# Patient Record
Sex: Female | Born: 1945
Health system: Southern US, Community
[De-identification: ages and names within clinical notes are randomized; demographics above are authoritative.]

## PROBLEM LIST (undated history)

## (undated) DIAGNOSIS — M75100 Unspecified rotator cuff tear or rupture of unspecified shoulder, not specified as traumatic: Secondary | ICD-10-CM

## (undated) DIAGNOSIS — A239 Brucellosis, unspecified: Secondary | ICD-10-CM

## (undated) DIAGNOSIS — I871 Compression of vein: Secondary | ICD-10-CM

## (undated) DIAGNOSIS — J90 Pleural effusion, not elsewhere classified: Secondary | ICD-10-CM

## (undated) DIAGNOSIS — I442 Atrioventricular block, complete: Secondary | ICD-10-CM

## (undated) DIAGNOSIS — R0600 Dyspnea, unspecified: Secondary | ICD-10-CM

## (undated) DIAGNOSIS — I639 Cerebral infarction, unspecified: Secondary | ICD-10-CM

## (undated) DIAGNOSIS — I495 Sick sinus syndrome: Secondary | ICD-10-CM

## (undated) DIAGNOSIS — T8859XA Other complications of anesthesia, initial encounter: Secondary | ICD-10-CM

## (undated) DIAGNOSIS — C50919 Malignant neoplasm of unspecified site of unspecified female breast: Secondary | ICD-10-CM

## (undated) DIAGNOSIS — Z923 Personal history of irradiation: Secondary | ICD-10-CM

## (undated) DIAGNOSIS — I4891 Unspecified atrial fibrillation: Secondary | ICD-10-CM

## (undated) DIAGNOSIS — Z8719 Personal history of other diseases of the digestive system: Secondary | ICD-10-CM

## (undated) DIAGNOSIS — E785 Hyperlipidemia, unspecified: Secondary | ICD-10-CM

## (undated) DIAGNOSIS — D219 Benign neoplasm of connective and other soft tissue, unspecified: Secondary | ICD-10-CM

## (undated) DIAGNOSIS — Z87442 Personal history of urinary calculi: Secondary | ICD-10-CM

## (undated) DIAGNOSIS — Z85850 Personal history of malignant neoplasm of thyroid: Secondary | ICD-10-CM

## (undated) DIAGNOSIS — Z8709 Personal history of other diseases of the respiratory system: Secondary | ICD-10-CM

## (undated) DIAGNOSIS — C3430 Malignant neoplasm of lower lobe, unspecified bronchus or lung: Secondary | ICD-10-CM

## (undated) DIAGNOSIS — Z950 Presence of cardiac pacemaker: Secondary | ICD-10-CM

## (undated) DIAGNOSIS — T4145XA Adverse effect of unspecified anesthetic, initial encounter: Secondary | ICD-10-CM

## (undated) DIAGNOSIS — K635 Polyp of colon: Secondary | ICD-10-CM

## (undated) DIAGNOSIS — C73 Malignant neoplasm of thyroid gland: Secondary | ICD-10-CM

## (undated) DIAGNOSIS — D649 Anemia, unspecified: Secondary | ICD-10-CM

## (undated) DIAGNOSIS — I4819 Other persistent atrial fibrillation: Secondary | ICD-10-CM

## (undated) DIAGNOSIS — Z87898 Personal history of other specified conditions: Secondary | ICD-10-CM

## (undated) DIAGNOSIS — I10 Essential (primary) hypertension: Secondary | ICD-10-CM

## (undated) DIAGNOSIS — E039 Hypothyroidism, unspecified: Secondary | ICD-10-CM

## (undated) DIAGNOSIS — N809 Endometriosis, unspecified: Secondary | ICD-10-CM

## (undated) DIAGNOSIS — C8590 Non-Hodgkin lymphoma, unspecified, unspecified site: Secondary | ICD-10-CM

## (undated) DIAGNOSIS — N200 Calculus of kidney: Secondary | ICD-10-CM

## (undated) DIAGNOSIS — Z5189 Encounter for other specified aftercare: Secondary | ICD-10-CM

## (undated) DIAGNOSIS — K759 Inflammatory liver disease, unspecified: Secondary | ICD-10-CM

## (undated) DIAGNOSIS — M1990 Unspecified osteoarthritis, unspecified site: Secondary | ICD-10-CM

## (undated) HISTORY — DX: Benign neoplasm of connective and other soft tissue, unspecified: D21.9

## (undated) HISTORY — DX: Hyperlipidemia, unspecified: E78.5

## (undated) HISTORY — DX: Sick sinus syndrome: I49.5

## (undated) HISTORY — DX: Personal history of malignant neoplasm of thyroid: Z85.850

## (undated) HISTORY — PX: CARDIAC CATHETERIZATION: SHX172

## (undated) HISTORY — DX: Polyp of colon: K63.5

## (undated) HISTORY — DX: Essential (primary) hypertension: I10

## (undated) HISTORY — DX: Malignant neoplasm of thyroid gland: C73

## (undated) HISTORY — PX: LAPAROTOMY: SHX154

## (undated) HISTORY — PX: EYE SURGERY: SHX253

## (undated) HISTORY — DX: Cerebral infarction, unspecified: I63.9

## (undated) HISTORY — PX: KNEE ARTHROSCOPY: SUR90

## (undated) HISTORY — DX: Endometriosis, unspecified: N80.9

## (undated) HISTORY — PX: PORTA CATH INSERTION: CATH118285

## (undated) HISTORY — DX: Morbid (severe) obesity due to excess calories: E66.01

## (undated) HISTORY — PX: OTHER SURGICAL HISTORY: SHX169

## (undated) HISTORY — PX: CHOLECYSTECTOMY: SHX55

## (undated) HISTORY — DX: Malignant neoplasm of unspecified site of unspecified female breast: C50.919

## (undated) HISTORY — PX: TONSILLECTOMY: SUR1361

## (undated) HISTORY — PX: APPENDECTOMY: SHX54

## (undated) HISTORY — DX: Brucellosis, unspecified: A23.9

## (undated) HISTORY — DX: Calculus of kidney: N20.0

## (undated) HISTORY — PX: LAPAROSCOPIC GASTRIC BANDING: SHX1100

## (undated) HISTORY — PX: COLONOSCOPY W/ POLYPECTOMY: SHX1380

## (undated) HISTORY — DX: Unspecified atrial fibrillation: I48.91

---
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
## 1981-09-23 HISTORY — PX: ABDOMINAL HYSTERECTOMY: SHX81

## 1996-09-23 HISTORY — PX: OTHER SURGICAL HISTORY: SHX169

## 1996-09-23 HISTORY — PX: THYROIDECTOMY: SHX17

## 1997-09-23 HISTORY — PX: OTHER SURGICAL HISTORY: SHX169

## 1998-01-25 ENCOUNTER — Other Ambulatory Visit: Admission: RE | Admit: 1998-01-25 | Discharge: 1998-01-25 | Payer: Self-pay | Admitting: Endocrinology

## 1998-03-01 ENCOUNTER — Inpatient Hospital Stay (HOSPITAL_COMMUNITY): Admission: RE | Admit: 1998-03-01 | Discharge: 1998-03-03 | Payer: Self-pay | Admitting: Surgery

## 1998-04-10 ENCOUNTER — Ambulatory Visit (HOSPITAL_COMMUNITY): Admission: RE | Admit: 1998-04-10 | Discharge: 1998-04-10 | Payer: Self-pay | Admitting: *Deleted

## 1998-04-13 ENCOUNTER — Ambulatory Visit (HOSPITAL_COMMUNITY): Admission: RE | Admit: 1998-04-13 | Discharge: 1998-04-13 | Payer: Self-pay | Admitting: *Deleted

## 1998-09-23 DIAGNOSIS — C73 Malignant neoplasm of thyroid gland: Secondary | ICD-10-CM

## 1998-09-23 HISTORY — DX: Malignant neoplasm of thyroid gland: C73

## 1999-04-20 ENCOUNTER — Encounter (INDEPENDENT_AMBULATORY_CARE_PROVIDER_SITE_OTHER): Payer: Self-pay | Admitting: Specialist

## 1999-04-20 ENCOUNTER — Ambulatory Visit (HOSPITAL_COMMUNITY): Admission: RE | Admit: 1999-04-20 | Discharge: 1999-04-20 | Payer: Self-pay | Admitting: Gastroenterology

## 1999-04-25 ENCOUNTER — Inpatient Hospital Stay (HOSPITAL_COMMUNITY): Admission: AD | Admit: 1999-04-25 | Discharge: 1999-04-28 | Payer: Self-pay | Admitting: Cardiology

## 1999-04-25 ENCOUNTER — Encounter: Payer: Self-pay | Admitting: Cardiology

## 1999-05-03 ENCOUNTER — Observation Stay (HOSPITAL_COMMUNITY): Admission: AD | Admit: 1999-05-03 | Discharge: 1999-05-04 | Payer: Self-pay | Admitting: Internal Medicine

## 1999-05-22 ENCOUNTER — Other Ambulatory Visit: Admission: RE | Admit: 1999-05-22 | Discharge: 1999-05-22 | Payer: Self-pay | Admitting: Gynecology

## 1999-09-26 ENCOUNTER — Encounter: Admission: RE | Admit: 1999-09-26 | Discharge: 1999-09-26 | Payer: Self-pay | Admitting: Orthopedic Surgery

## 1999-09-26 ENCOUNTER — Encounter: Payer: Self-pay | Admitting: Orthopedic Surgery

## 1999-09-27 ENCOUNTER — Encounter: Admission: RE | Admit: 1999-09-27 | Discharge: 1999-09-27 | Payer: Self-pay | Admitting: Orthopedic Surgery

## 1999-09-27 ENCOUNTER — Encounter: Payer: Self-pay | Admitting: Orthopedic Surgery

## 1999-12-03 ENCOUNTER — Encounter: Admission: RE | Admit: 1999-12-03 | Discharge: 1999-12-03 | Payer: Self-pay | Admitting: Gynecology

## 1999-12-03 ENCOUNTER — Encounter: Payer: Self-pay | Admitting: Gynecology

## 1999-12-07 ENCOUNTER — Encounter: Admission: RE | Admit: 1999-12-07 | Discharge: 1999-12-07 | Payer: Self-pay | Admitting: Gynecology

## 1999-12-07 ENCOUNTER — Encounter: Payer: Self-pay | Admitting: Gynecology

## 1999-12-21 ENCOUNTER — Other Ambulatory Visit: Admission: RE | Admit: 1999-12-21 | Discharge: 1999-12-21 | Payer: Self-pay | Admitting: Gynecology

## 2003-08-08 ENCOUNTER — Inpatient Hospital Stay (HOSPITAL_COMMUNITY): Admission: EM | Admit: 2003-08-08 | Discharge: 2003-08-10 | Payer: Self-pay | Admitting: Emergency Medicine

## 2003-08-09 ENCOUNTER — Encounter (INDEPENDENT_AMBULATORY_CARE_PROVIDER_SITE_OTHER): Payer: Self-pay | Admitting: Cardiology

## 2005-03-10 IMAGING — MR MR MRA NECK W/ CM
10 of 14 series · 23 of 48 positions shown · IV contrast (omniscan)
Comparison: none

CLINICAL DATA: TIA, headache, gait disturbance.
 MRI OF THE BRAIN WITH AND WITHOUT CONTRAST
 Multiplanar T1- and T2-weighted imaging was performed including diffusion imaging and imaging after the intravenous administration of 20 cc of Omniscan.  
 Diffusion imaging does not show any acute or subacute stroke.  There is an old small vessel stroke in the right side of the pons.  The cerebellum does not show any focal insult.  Within the cerebral hemispheres, there are scattered foci of abnormal high signal on flair and T2-weighted imaging within the deep and subcortical white matter consistent with chronic small vessel disease.  There are some old lacunar insults in the basal ganglia and thalami.  No evidence of cortical or large vessel stroke.  No sign of mass, hemorrhage, hydrocephalus, or extra-axial collection.  After contrast administration, there is no abnormal enhancement.
 IMPRESSION
 1.  No acute finding.  Moderate changes of chronic small vessel disease throughout the brain as described.
 INTRACRANIAL MR ANGIOGRAPHY
 Time of flight MR angiography was performed of the proximal intracranial vasculature.  2 and 3-D reconstructions were done.  
 Both internal carotid arteries are widely patent into the brain.  No siphon stenosis. The anterior and middle cerebral vessels are patent bilaterally without stenosis, aneurysm, or vascular malformation. Both vertebral arteries are patent to form a normal caliber basilar artery.  The cerebellar circulation is intact.  Both posterior cerebral arteries appear patent and normal. 
 1.  Normal intracranial MR angiography of the large and medium sized vessels.
 MR ANGIOGRAPHY OF THE NECK VESSELS
 Time of flight MR angiography is performed of the neck vessels during the bolus administration of 20 cc of Omniscan.  2 and 3-D reconstructions were done.
 The branching pattern of the brachiocephalic vessels from the arch is normal.  No origin stenoses.  Both common carotid arteries are widely patent to the bifurcations.  There is no atherosclerotic narrowing or irregularity at either carotid bifurcation.  Both vertebral arteries are patent.  The left vertebral artery is quite tortuous proximally and it is difficult to evaluate this accurately.  There is some signal loss in the region of the left vertebral artery origin but this is more likely artifactual than related to a stenosis in this patient who doesn?t show any stenosis anywhere else.
 1.  Normal MR angiography of the neck vessels.  Artifactual signal loss at the left vertebral artery origin which I do not think relates to true pathology.

[Series 2: T1 · sagittal · 5.0mm · 0.43mm/px · 1 of 12 slices shown]
[im 1/12]
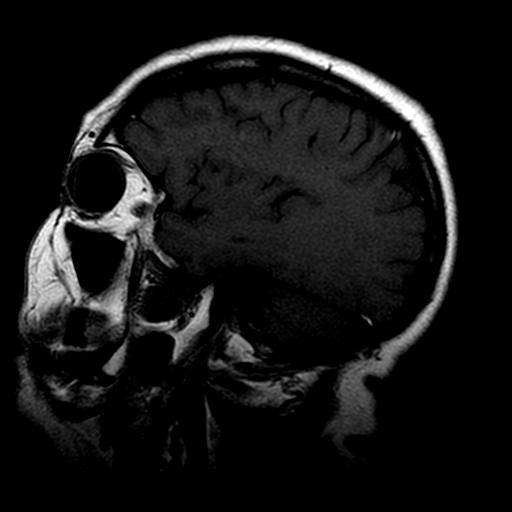

[Series 3: DWI · axial · 5.0mm · 1.25mm/px · z∈[+14,+124]mm · 2 of 48 slices shown]
[im 1/48]
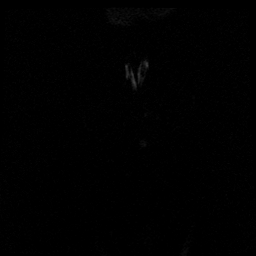
[im 48/48]
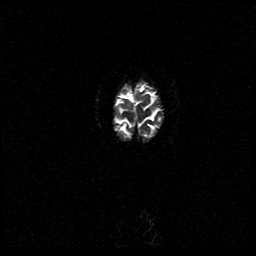

[Series 5: T2 · axial · 5.0mm · 0.43mm/px · 1 of 20 slices shown (1 of 2)]
[im 1/20]
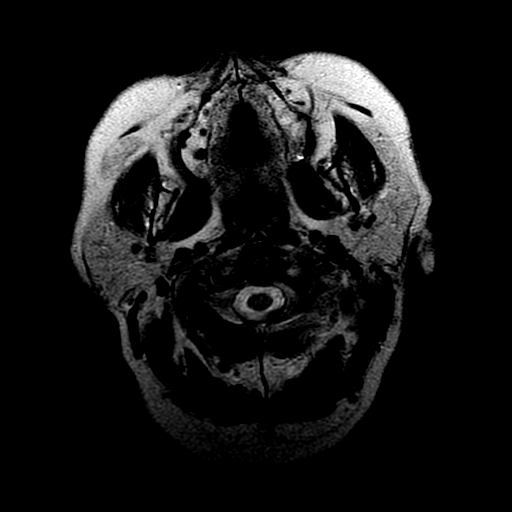

[Series 6: FLAIR · axial · 5.0mm · 0.43mm/px · 1 of 20 slices shown]
[im 1/20]
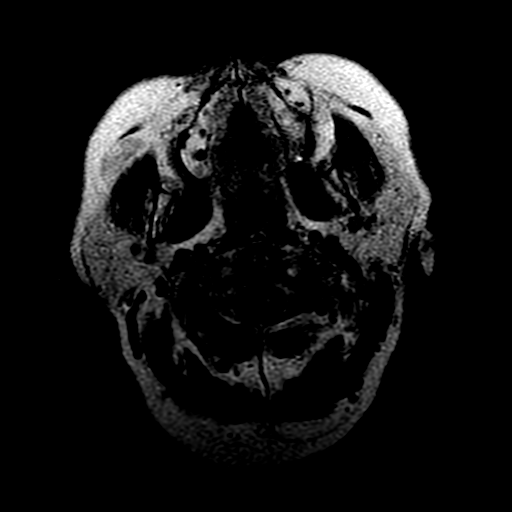

[Series 8: T2 · coronal · 5.0mm · 0.43mm/px · 1 of 20 slices shown (2 of 2)]
[im 1/20]
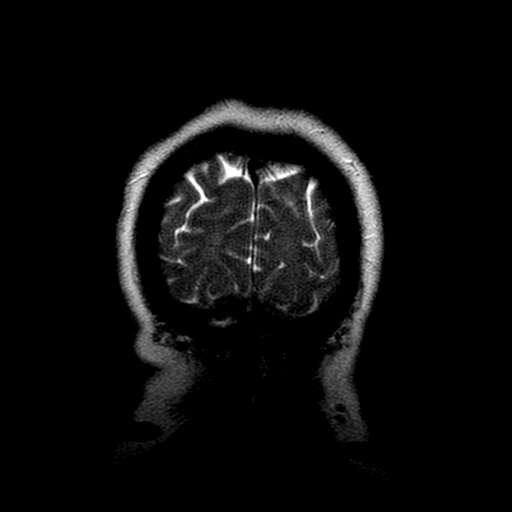

[Series 9: loc cor · axial · 5.0mm · 0.68mm/px · 1 of 21 slices shown]
[im 1/21]
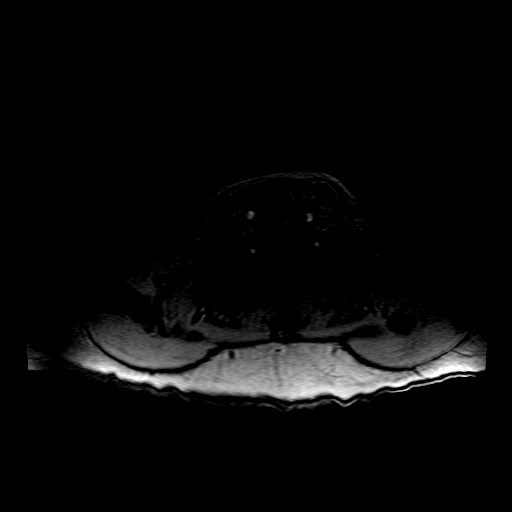

[Series 10: TOF · axial · 4.0mm · 1.09mm/px · z∈[-255,+8]mm · 5 of 102 slices shown]
[im 1/102]
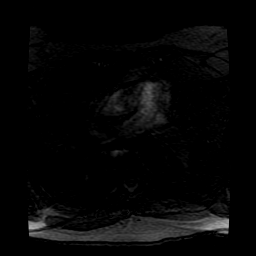
[im 26/102]
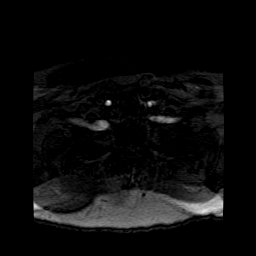
[im 51/102]
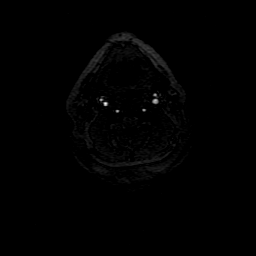
[im 76/102]
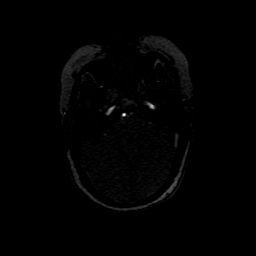
[im 102/102]
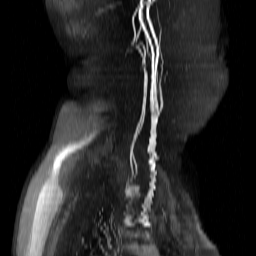

[Series 11: mask · coronal · 1.4mm · 0.55mm/px · 6 of 113 slices shown]
[im 1/113]
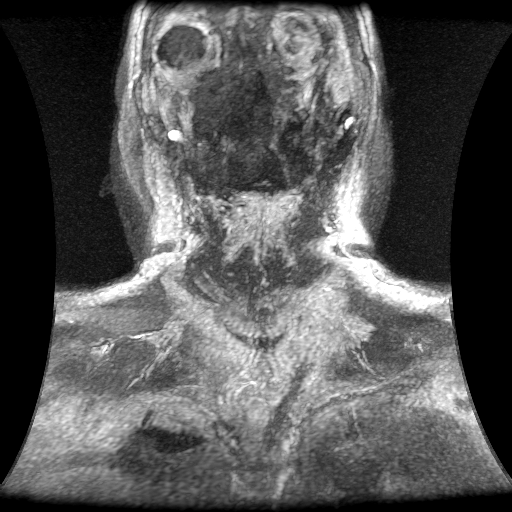
[im 23/113]
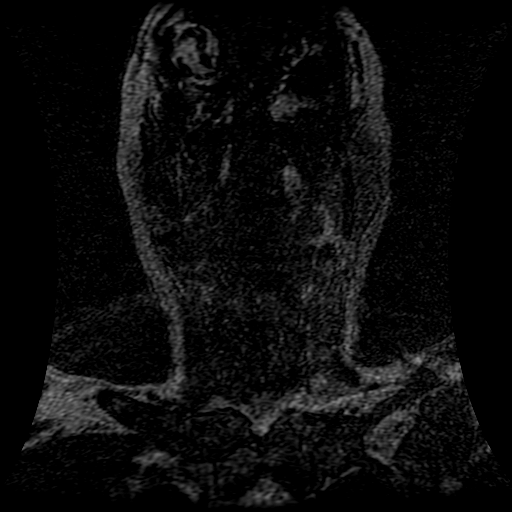
[im 45/113]
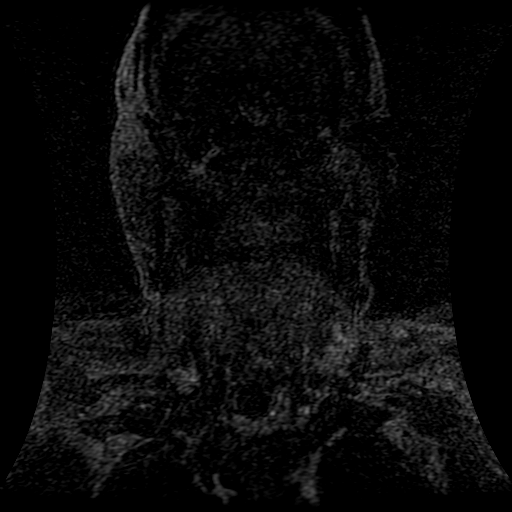
[im 68/113]
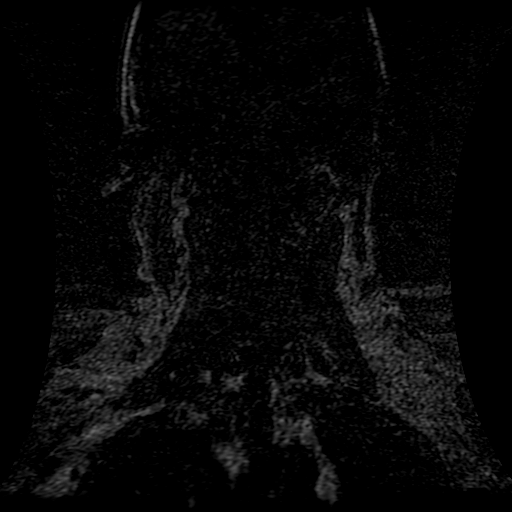
[im 90/113]
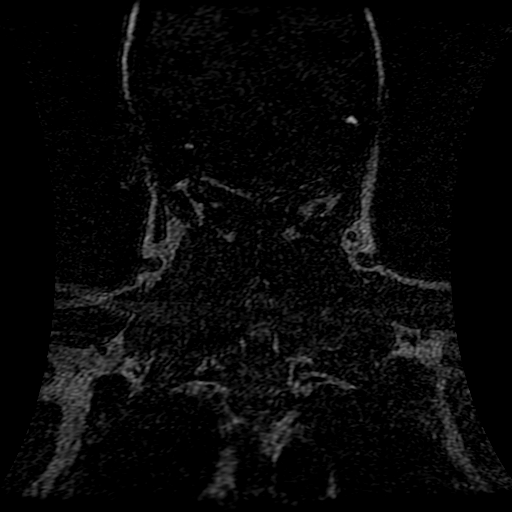
[im 113/113]
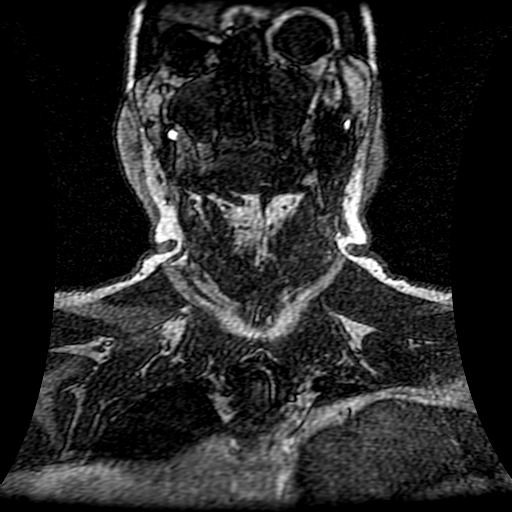

[Series 12: fl tr cor · coronal · 1.4mm · 0.55mm/px · 4 of 224 slices shown]
[im 1/224]
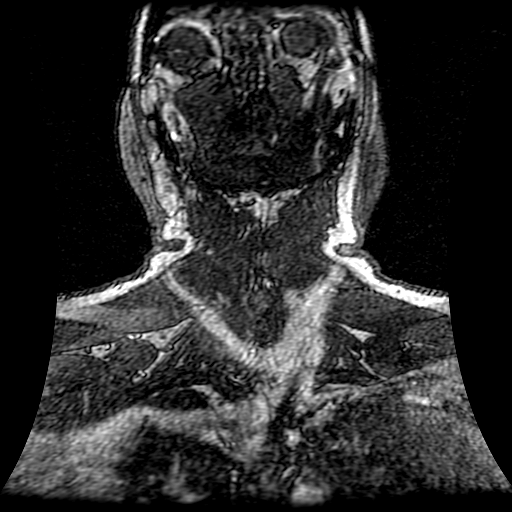
[im 41/224]
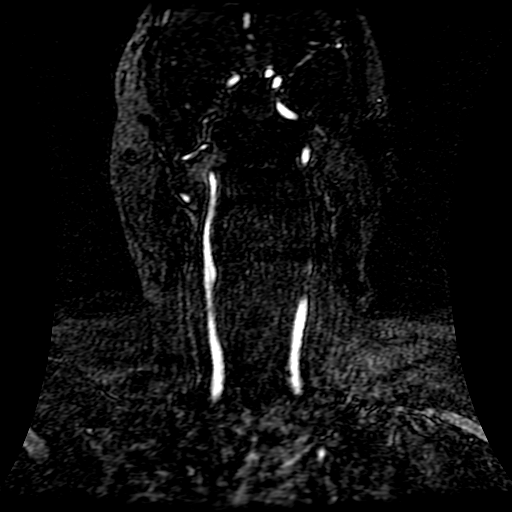
[im 61/224]
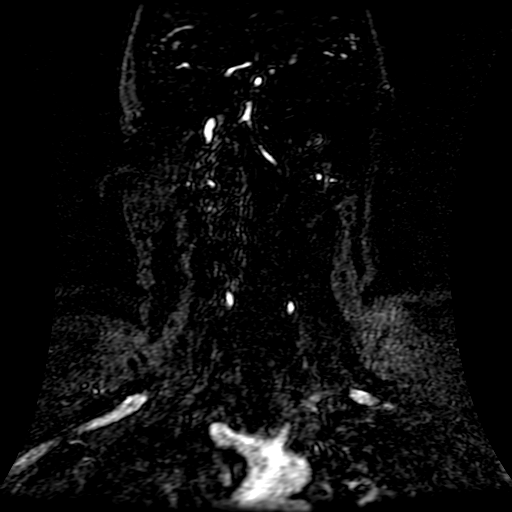
[im 102/224]
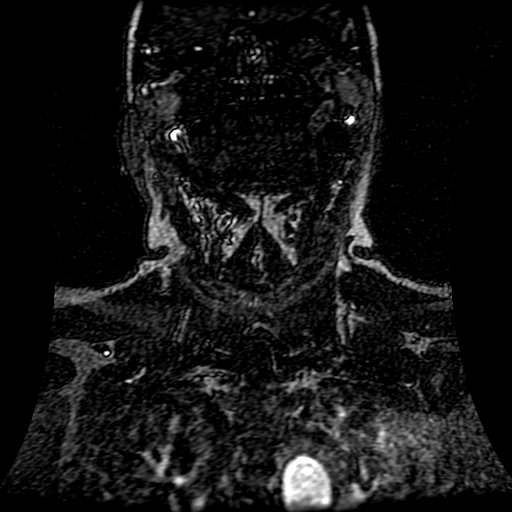

[Series 14: T1 post-contrast · coronal · 5.0mm · 0.43mm/px · 1 of 22 slices shown]
[im 1/22]
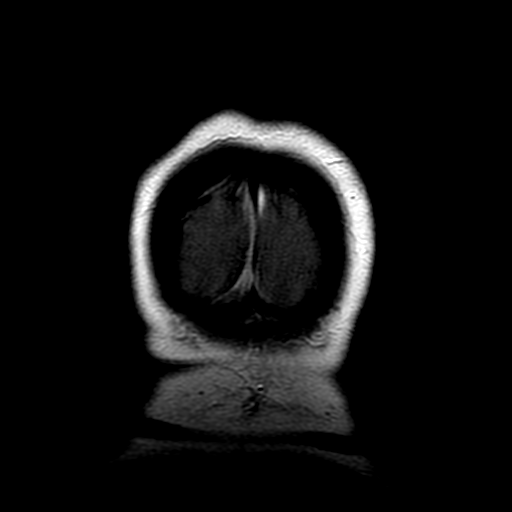

[23 of 48 positions shown; findings below may reference images not displayed]

## 2005-03-10 IMAGING — CT CT HEAD W/O CM
1 series · 16 of 30 positions shown, 20 images · non-contrast
Comparison: none

CLINICAL DATA: Dizziness, disorientation/atrial fibrillation. 
 CT OF THE BRAIN, WITHOUT CONTRAST 
 Routine unenhanced CT of the brain with no priors for comparison. 
 Ventricular size and CSF spaces within normal limits.  On image #17, there is a small lacunar infarct in the left caudate nucleus with a suggestion of a rim like area in the periphery surrounding it.  This is consistent with an infarct and could be acute or subacute. It is not a typical old lacunar infarct.  
 There is also bilateral basal ganglia calcification.  
 No mass effect or bleed. 
 IMPRESSION 
 There is an infarct in the left caudate nucleus, age indeterminant.  
 Otherwise, no acute or focal abnormality.

[Series 2: brain · axial · 0.47mm/px · z∈[+143,+284]mm · 16 of 30 slices shown, 20 images]
[im 2/30  brain]
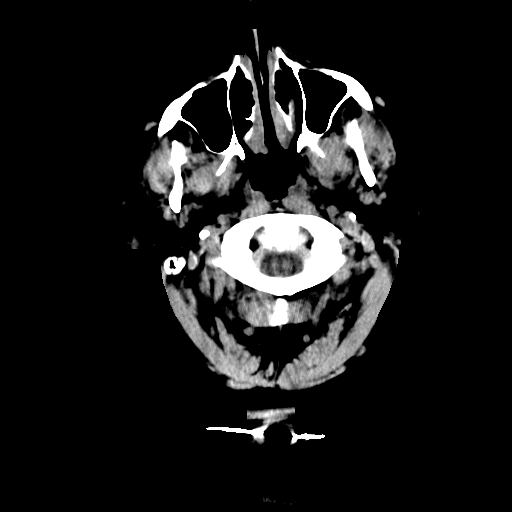
[im 2/30  bone]
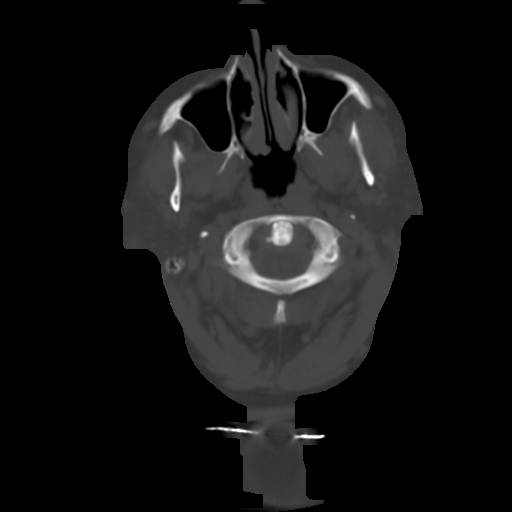
[im 4/30  brain]
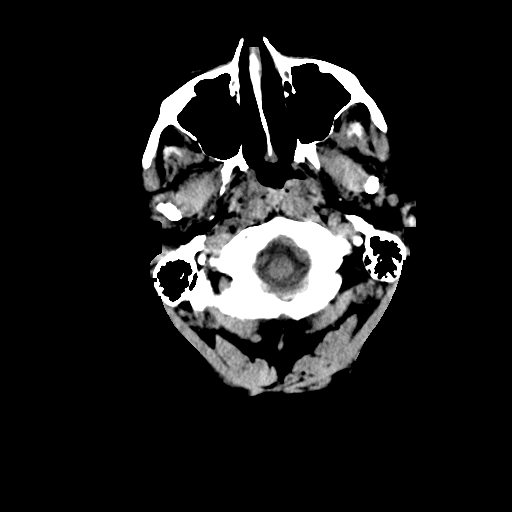
[im 6/30  brain]
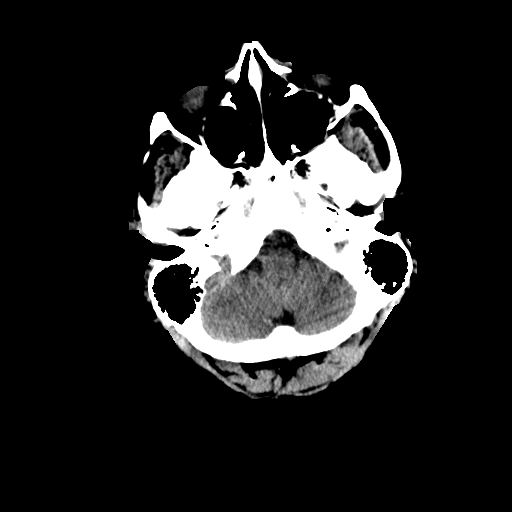
[im 8/30  brain]
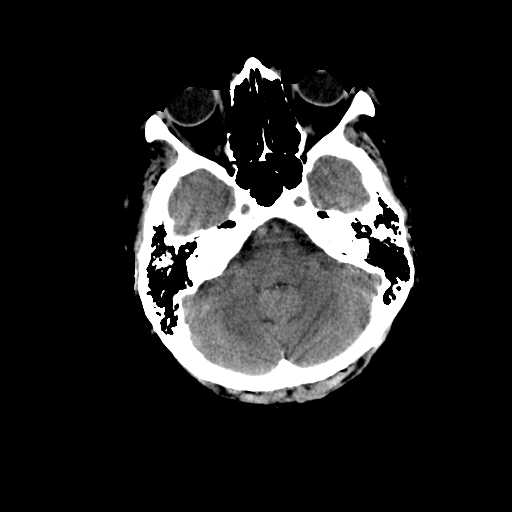
[im 9/30  brain]
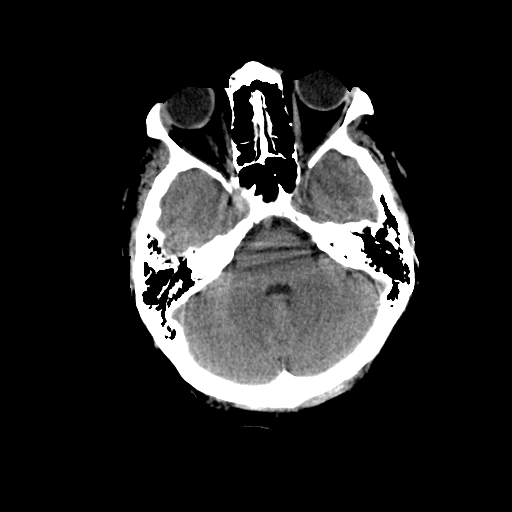
[im 9/30  bone]
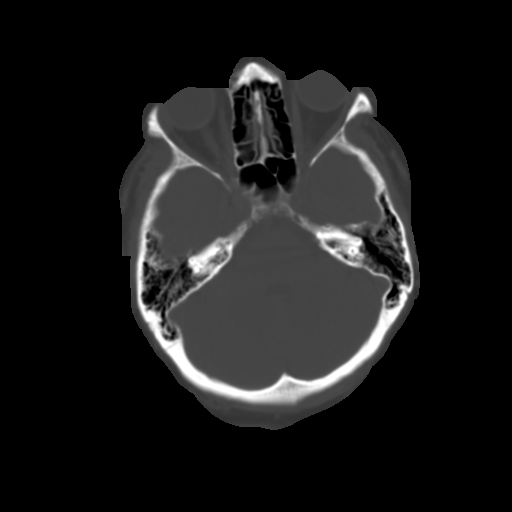
[im 11/30  brain]
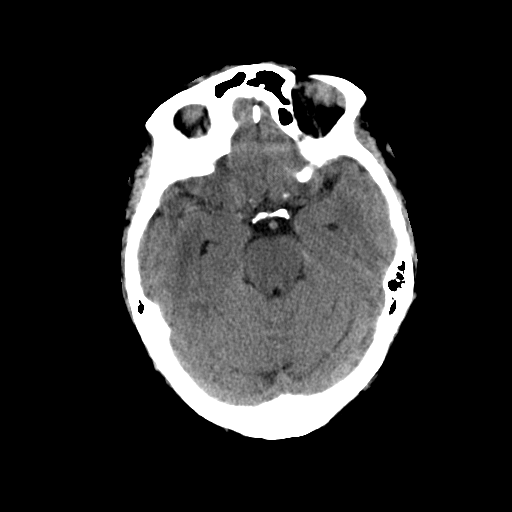
[im 13/30  brain]
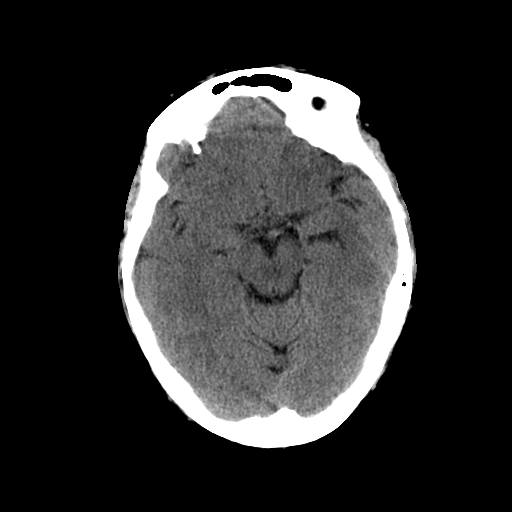
[im 15/30  brain]
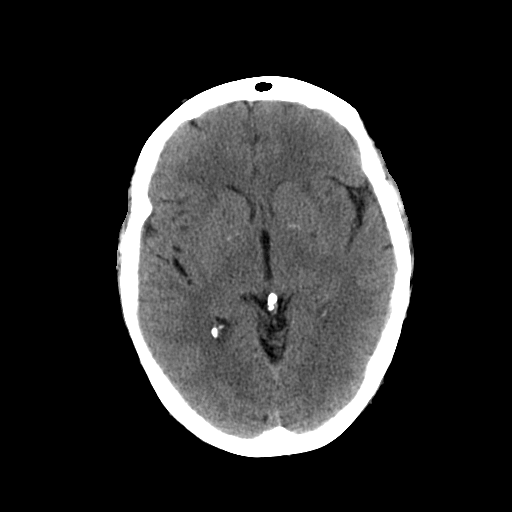
[im 16/30  brain]
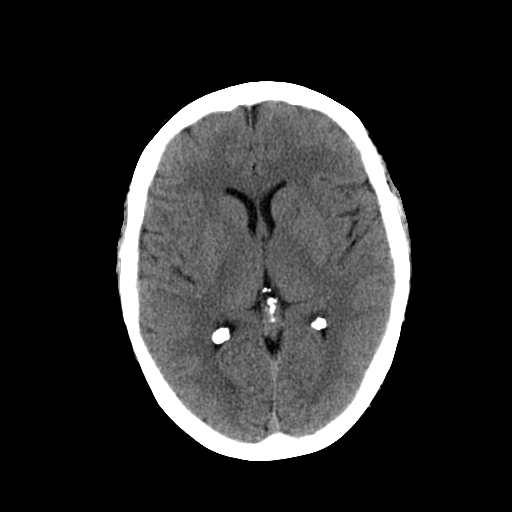
[im 16/30  bone]
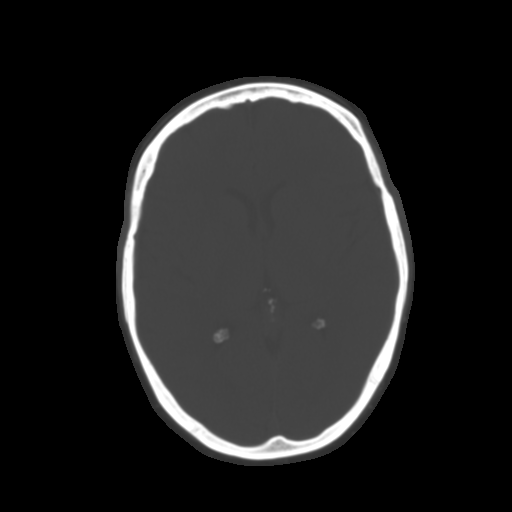
[im 18/30  brain]
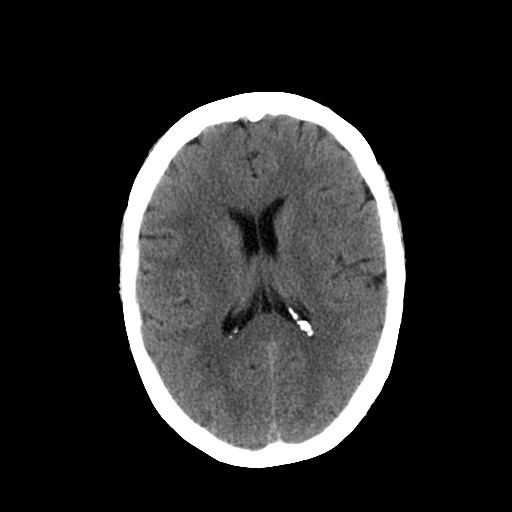
[im 20/30  brain]
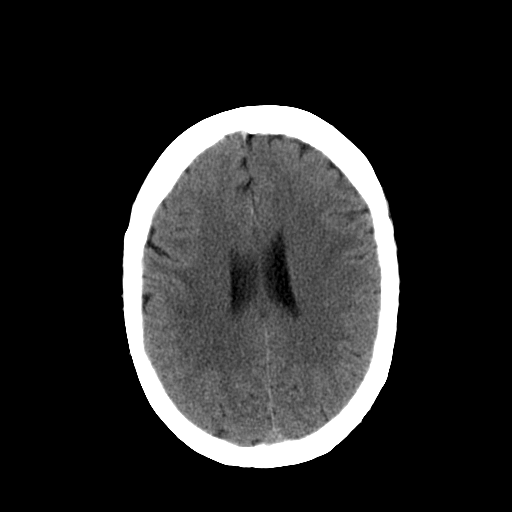
[im 22/30  brain]
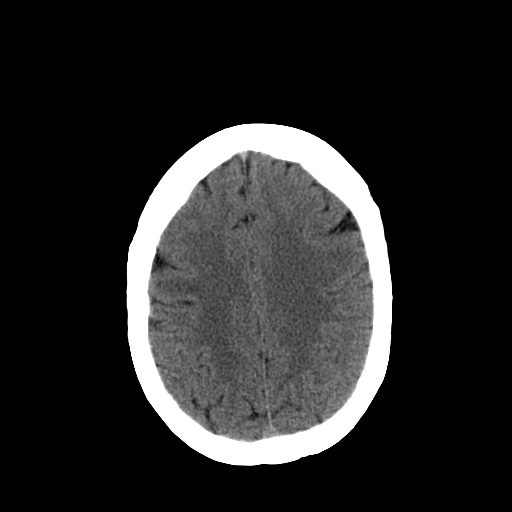
[im 23/30  brain]
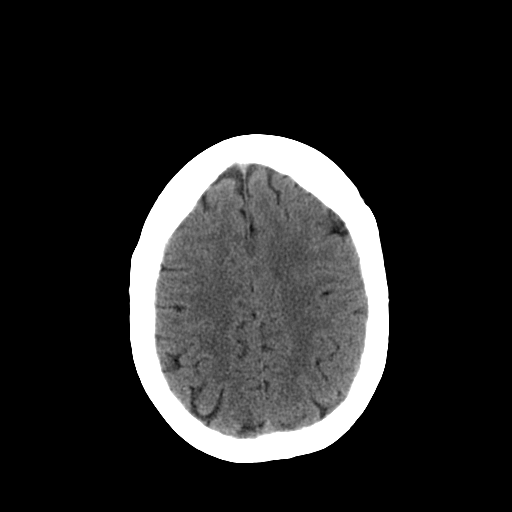
[im 23/30  bone]
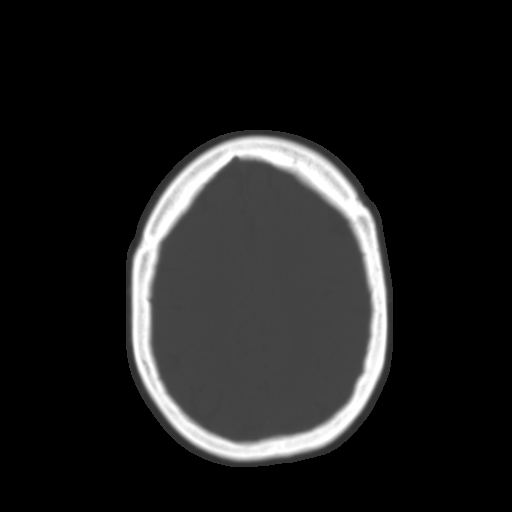
[im 25/30  brain]
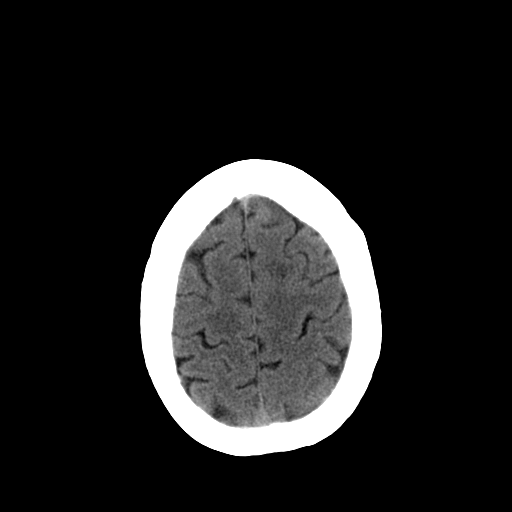
[im 27/30  brain]
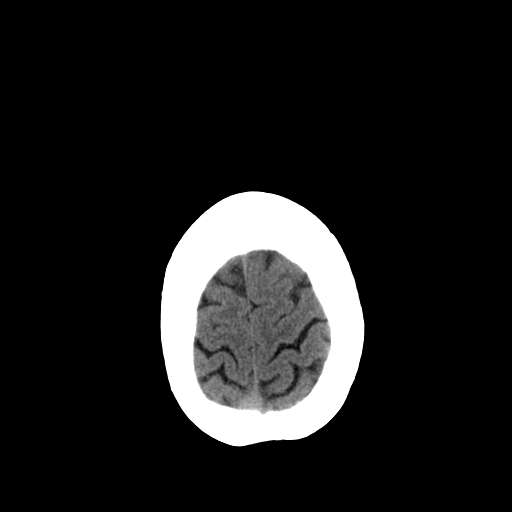
[im 29/30  brain]
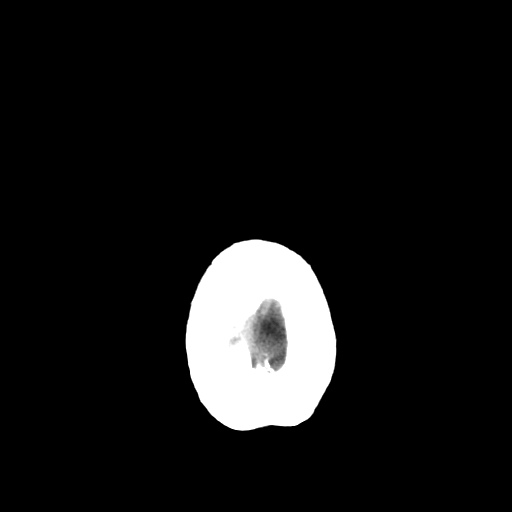

[16 of 30 positions shown; findings below may reference images not displayed]

## 2006-08-01 ENCOUNTER — Emergency Department (HOSPITAL_COMMUNITY): Admission: EM | Admit: 2006-08-01 | Discharge: 2006-08-01 | Payer: Self-pay | Admitting: Emergency Medicine

## 2007-03-04 ENCOUNTER — Ambulatory Visit: Payer: Self-pay

## 2007-04-08 ENCOUNTER — Ambulatory Visit: Payer: Self-pay | Admitting: Internal Medicine

## 2007-04-08 LAB — CONVERTED CEMR LAB
Albumin: 3.7 g/dL (ref 3.5–5.2)
Alkaline Phosphatase: 84 units/L (ref 39–117)
Total Protein: 7.3 g/dL (ref 6.0–8.3)

## 2007-05-21 ENCOUNTER — Ambulatory Visit: Payer: Self-pay | Admitting: Cardiology

## 2007-06-04 ENCOUNTER — Ambulatory Visit: Payer: Self-pay | Admitting: Internal Medicine

## 2007-06-22 ENCOUNTER — Ambulatory Visit: Payer: Self-pay | Admitting: Internal Medicine

## 2007-06-22 DIAGNOSIS — E782 Mixed hyperlipidemia: Secondary | ICD-10-CM | POA: Insufficient documentation

## 2007-06-22 DIAGNOSIS — I4891 Unspecified atrial fibrillation: Secondary | ICD-10-CM | POA: Insufficient documentation

## 2007-06-22 DIAGNOSIS — Z85850 Personal history of malignant neoplasm of thyroid: Secondary | ICD-10-CM | POA: Insufficient documentation

## 2007-06-23 ENCOUNTER — Ambulatory Visit: Payer: Self-pay | Admitting: Cardiology

## 2007-06-29 ENCOUNTER — Encounter: Payer: Self-pay | Admitting: Internal Medicine

## 2007-07-14 ENCOUNTER — Encounter: Payer: Self-pay | Admitting: Internal Medicine

## 2007-07-15 ENCOUNTER — Ambulatory Visit: Payer: Self-pay | Admitting: Cardiology

## 2007-07-15 ENCOUNTER — Ambulatory Visit: Payer: Self-pay | Admitting: Internal Medicine

## 2007-08-07 ENCOUNTER — Encounter: Payer: Self-pay | Admitting: Internal Medicine

## 2007-08-07 ENCOUNTER — Ambulatory Visit: Payer: Self-pay

## 2007-08-07 ENCOUNTER — Ambulatory Visit: Payer: Self-pay | Admitting: Cardiology

## 2007-08-10 ENCOUNTER — Ambulatory Visit (HOSPITAL_BASED_OUTPATIENT_CLINIC_OR_DEPARTMENT_OTHER): Admission: RE | Admit: 2007-08-10 | Discharge: 2007-08-10 | Payer: Self-pay | Admitting: Internal Medicine

## 2007-08-10 ENCOUNTER — Encounter: Payer: Self-pay | Admitting: Pulmonary Disease

## 2007-08-12 ENCOUNTER — Telehealth (INDEPENDENT_AMBULATORY_CARE_PROVIDER_SITE_OTHER): Payer: Self-pay | Admitting: *Deleted

## 2007-08-13 ENCOUNTER — Ambulatory Visit: Payer: Self-pay | Admitting: Cardiology

## 2007-08-18 ENCOUNTER — Ambulatory Visit: Payer: Self-pay | Admitting: Pulmonary Disease

## 2007-08-19 ENCOUNTER — Ambulatory Visit: Payer: Self-pay | Admitting: Cardiology

## 2007-08-28 ENCOUNTER — Ambulatory Visit: Payer: Self-pay | Admitting: Cardiology

## 2007-08-31 ENCOUNTER — Encounter: Payer: Self-pay | Admitting: Internal Medicine

## 2007-09-04 ENCOUNTER — Ambulatory Visit: Payer: Self-pay | Admitting: Internal Medicine

## 2007-09-10 ENCOUNTER — Encounter: Payer: Self-pay | Admitting: Internal Medicine

## 2007-09-13 ENCOUNTER — Ambulatory Visit: Payer: Self-pay | Admitting: Cardiology

## 2007-09-13 ENCOUNTER — Emergency Department (HOSPITAL_COMMUNITY): Admission: EM | Admit: 2007-09-13 | Discharge: 2007-09-13 | Payer: Self-pay | Admitting: Emergency Medicine

## 2007-09-22 ENCOUNTER — Ambulatory Visit: Payer: Self-pay | Admitting: Cardiology

## 2007-09-22 ENCOUNTER — Ambulatory Visit: Payer: Self-pay | Admitting: Pulmonary Disease

## 2007-09-28 ENCOUNTER — Ambulatory Visit: Payer: Self-pay | Admitting: Internal Medicine

## 2007-09-28 ENCOUNTER — Ambulatory Visit: Payer: Self-pay | Admitting: Cardiology

## 2007-10-16 ENCOUNTER — Ambulatory Visit: Payer: Self-pay | Admitting: Cardiology

## 2007-10-30 ENCOUNTER — Telehealth (INDEPENDENT_AMBULATORY_CARE_PROVIDER_SITE_OTHER): Payer: Self-pay | Admitting: *Deleted

## 2007-11-03 ENCOUNTER — Ambulatory Visit: Payer: Self-pay | Admitting: Internal Medicine

## 2007-11-17 ENCOUNTER — Encounter: Payer: Self-pay | Admitting: Internal Medicine

## 2007-11-26 ENCOUNTER — Ambulatory Visit: Payer: Self-pay | Admitting: Cardiovascular Disease

## 2007-12-07 ENCOUNTER — Encounter: Payer: Self-pay | Admitting: Internal Medicine

## 2007-12-24 ENCOUNTER — Ambulatory Visit: Payer: Self-pay | Admitting: Cardiology

## 2008-01-14 ENCOUNTER — Ambulatory Visit: Payer: Self-pay | Admitting: Cardiology

## 2008-02-11 ENCOUNTER — Ambulatory Visit: Payer: Self-pay | Admitting: Internal Medicine
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
## 2008-03-10 ENCOUNTER — Ambulatory Visit: Payer: Self-pay | Admitting: Cardiology

## 2008-04-07 ENCOUNTER — Ambulatory Visit: Payer: Self-pay | Admitting: Cardiology

## 2008-04-22 ENCOUNTER — Ambulatory Visit: Payer: Self-pay | Admitting: Internal Medicine

## 2008-04-22 LAB — CONVERTED CEMR LAB
Bilirubin Urine: NEGATIVE
Ketones, urine, test strip: NEGATIVE
Protein, U semiquant: NEGATIVE
Urobilinogen, UA: 0.2
pH: 5

## 2008-04-23 ENCOUNTER — Encounter: Payer: Self-pay | Admitting: Internal Medicine

## 2008-04-24 LAB — CONVERTED CEMR LAB

## 2008-04-29 ENCOUNTER — Encounter (INDEPENDENT_AMBULATORY_CARE_PROVIDER_SITE_OTHER): Payer: Self-pay | Admitting: *Deleted

## 2008-04-29 ENCOUNTER — Ambulatory Visit: Payer: Self-pay | Admitting: Internal Medicine

## 2008-04-29 DIAGNOSIS — R319 Hematuria, unspecified: Secondary | ICD-10-CM | POA: Insufficient documentation

## 2008-04-29 DIAGNOSIS — R7309 Other abnormal glucose: Secondary | ICD-10-CM | POA: Insufficient documentation

## 2008-04-29 DIAGNOSIS — R21 Rash and other nonspecific skin eruption: Secondary | ICD-10-CM | POA: Insufficient documentation

## 2008-04-29 DIAGNOSIS — D126 Benign neoplasm of colon, unspecified: Secondary | ICD-10-CM | POA: Insufficient documentation

## 2008-04-29 LAB — CONVERTED CEMR LAB
Glucose, Urine, Semiquant: NEGATIVE
Ketones, urine, test strip: NEGATIVE
Nitrite: NEGATIVE
Specific Gravity, Urine: 1.02

## 2008-04-30 ENCOUNTER — Encounter: Payer: Self-pay | Admitting: Internal Medicine

## 2008-05-02 ENCOUNTER — Telehealth (INDEPENDENT_AMBULATORY_CARE_PROVIDER_SITE_OTHER): Payer: Self-pay | Admitting: *Deleted

## 2008-05-02 LAB — CONVERTED CEMR LAB
AST: 19 units/L (ref 0–37)
Albumin: 4 g/dL (ref 3.5–5.2)
Alkaline Phosphatase: 101 units/L (ref 39–117)
Creatinine, Ser: 0.9 mg/dL (ref 0.4–1.2)
TSH: 0.04 microintl units/mL — ABNORMAL LOW (ref 0.35–5.50)
Total Bilirubin: 1.1 mg/dL (ref 0.3–1.2)
Total CHOL/HDL Ratio: 5
Triglycerides: 151 mg/dL — ABNORMAL HIGH (ref 0–149)

## 2008-05-03 ENCOUNTER — Encounter: Payer: Self-pay | Admitting: Internal Medicine

## 2008-05-03 LAB — CONVERTED CEMR LAB
Nitrite: NEGATIVE
Specific Gravity, Urine: 1.023 (ref 1.005–1.03)
Urobilinogen, UA: 0.2 (ref 0.0–1.0)

## 2008-05-05 ENCOUNTER — Ambulatory Visit: Payer: Self-pay | Admitting: Cardiology

## 2008-05-12 ENCOUNTER — Encounter: Admission: RE | Admit: 2008-05-12 | Discharge: 2008-05-12 | Payer: Self-pay | Admitting: Internal Medicine

## 2008-05-17 ENCOUNTER — Telehealth (INDEPENDENT_AMBULATORY_CARE_PROVIDER_SITE_OTHER): Payer: Self-pay | Admitting: *Deleted

## 2008-05-26 ENCOUNTER — Ambulatory Visit: Payer: Self-pay | Admitting: Internal Medicine

## 2008-06-02 ENCOUNTER — Ambulatory Visit: Payer: Self-pay | Admitting: Cardiology

## 2008-06-15 ENCOUNTER — Ambulatory Visit: Payer: Self-pay | Admitting: Cardiology

## 2008-06-15 ENCOUNTER — Inpatient Hospital Stay (HOSPITAL_COMMUNITY): Admission: AD | Admit: 2008-06-15 | Discharge: 2008-06-17 | Payer: Self-pay | Admitting: Internal Medicine

## 2008-06-21 ENCOUNTER — Ambulatory Visit: Payer: Self-pay | Admitting: Internal Medicine

## 2008-07-07 ENCOUNTER — Ambulatory Visit: Payer: Self-pay | Admitting: Internal Medicine

## 2008-08-04 ENCOUNTER — Ambulatory Visit: Payer: Self-pay | Admitting: Cardiology

## 2008-08-16 ENCOUNTER — Ambulatory Visit: Payer: Self-pay | Admitting: Cardiology

## 2008-08-24 ENCOUNTER — Telehealth (INDEPENDENT_AMBULATORY_CARE_PROVIDER_SITE_OTHER): Payer: Self-pay | Admitting: *Deleted

## 2008-08-25 ENCOUNTER — Ambulatory Visit: Payer: Self-pay | Admitting: Internal Medicine

## 2008-09-03 LAB — CONVERTED CEMR LAB
Bilirubin, Direct: 0.1 mg/dL (ref 0.0–0.3)
LDL Cholesterol: 88 mg/dL (ref 0–99)
Total Bilirubin: 0.7 mg/dL (ref 0.3–1.2)
Total Protein: 7.1 g/dL (ref 6.0–8.3)
VLDL: 40 mg/dL (ref 0–40)

## 2008-09-06 ENCOUNTER — Ambulatory Visit: Payer: Self-pay | Admitting: Cardiology

## 2008-09-06 ENCOUNTER — Encounter (INDEPENDENT_AMBULATORY_CARE_PROVIDER_SITE_OTHER): Payer: Self-pay | Admitting: *Deleted

## 2008-09-06 ENCOUNTER — Telehealth (INDEPENDENT_AMBULATORY_CARE_PROVIDER_SITE_OTHER): Payer: Self-pay | Admitting: *Deleted

## 2008-09-06 LAB — CONVERTED CEMR LAB
BUN: 23 mg/dL (ref 6–23)
Chloride: 108 meq/L (ref 96–112)
GFR calc non Af Amer: 59 mL/min
Glucose, Bld: 100 mg/dL — ABNORMAL HIGH (ref 70–99)
Potassium: 4.5 meq/L (ref 3.5–5.1)

## 2008-09-08 ENCOUNTER — Ambulatory Visit: Payer: Self-pay | Admitting: Internal Medicine

## 2008-09-09 ENCOUNTER — Encounter: Payer: Self-pay | Admitting: Cardiology

## 2008-09-09 ENCOUNTER — Ambulatory Visit: Payer: Self-pay | Admitting: Cardiology

## 2008-09-09 ENCOUNTER — Ambulatory Visit (HOSPITAL_COMMUNITY): Admission: RE | Admit: 2008-09-09 | Discharge: 2008-09-09 | Payer: Self-pay | Admitting: Cardiology

## 2008-09-23 HISTORY — PX: BREAST LUMPECTOMY: SHX2

## 2008-09-26 ENCOUNTER — Ambulatory Visit: Payer: Self-pay | Admitting: Internal Medicine

## 2008-09-28 ENCOUNTER — Ambulatory Visit: Payer: Self-pay | Admitting: Pulmonary Disease

## 2008-09-28 ENCOUNTER — Inpatient Hospital Stay (HOSPITAL_COMMUNITY): Admission: EM | Admit: 2008-09-28 | Discharge: 2008-10-04 | Payer: Self-pay | Admitting: Emergency Medicine

## 2008-09-28 ENCOUNTER — Ambulatory Visit: Payer: Self-pay | Admitting: Internal Medicine

## 2008-09-28 ENCOUNTER — Ambulatory Visit: Payer: Self-pay | Admitting: Family Medicine

## 2008-10-02 ENCOUNTER — Encounter: Payer: Self-pay | Admitting: Pulmonary Disease

## 2008-10-06 ENCOUNTER — Ambulatory Visit: Payer: Self-pay | Admitting: Internal Medicine

## 2008-10-11 ENCOUNTER — Ambulatory Visit: Payer: Self-pay | Admitting: Pulmonary Disease

## 2008-10-13 ENCOUNTER — Ambulatory Visit: Payer: Self-pay | Admitting: Internal Medicine

## 2008-10-13 LAB — CONVERTED CEMR LAB
Basophils Absolute: 0 10*3/uL (ref 0.0–0.1)
Bilirubin, Direct: 0.1 mg/dL (ref 0.0–0.3)
Calcium: 9 mg/dL (ref 8.4–10.5)
Creatinine, Ser: 1.2 mg/dL (ref 0.4–1.2)
Eosinophils Absolute: 0.1 10*3/uL (ref 0.0–0.7)
Eosinophils Relative: 0.4 % (ref 0.0–5.0)
GFR calc non Af Amer: 48 mL/min
HCT: 40.5 % (ref 36.0–46.0)
INR: 6.9 (ref 0.8–1.0)
Lymphocytes Relative: 7.9 % — ABNORMAL LOW (ref 12.0–46.0)
Monocytes Relative: 4.9 % (ref 3.0–12.0)
Platelets: 291 10*3/uL (ref 150–400)
Sodium: 139 meq/L (ref 135–145)
Total Bilirubin: 0.9 mg/dL (ref 0.3–1.2)
WBC: 15.1 10*3/uL — ABNORMAL HIGH (ref 4.5–10.5)

## 2008-10-17 ENCOUNTER — Ambulatory Visit: Payer: Self-pay | Admitting: Internal Medicine

## 2008-10-17 LAB — CONVERTED CEMR LAB
Basophils Relative: 0.1 % (ref 0.0–3.0)
Eosinophils Relative: 1 % (ref 0.0–5.0)
Hemoglobin: 14 g/dL (ref 12.0–15.0)
INR: 2.1 — ABNORMAL HIGH (ref 0.8–1.0)
Lymphocytes Relative: 20.6 % (ref 12.0–46.0)
Monocytes Relative: 10 % (ref 3.0–12.0)
Neutro Abs: 8.4 10*3/uL — ABNORMAL HIGH (ref 1.4–7.7)
Prothrombin Time: 21.7 s — ABNORMAL HIGH (ref 10.9–13.3)
RBC: 4.69 M/uL (ref 3.87–5.11)
WBC: 12.2 10*3/uL — ABNORMAL HIGH (ref 4.5–10.5)

## 2008-10-21 ENCOUNTER — Ambulatory Visit: Payer: Self-pay | Admitting: Internal Medicine

## 2008-10-27 ENCOUNTER — Ambulatory Visit: Payer: Self-pay | Admitting: Internal Medicine

## 2008-11-01 ENCOUNTER — Ambulatory Visit: Payer: Self-pay | Admitting: Internal Medicine

## 2008-11-10 ENCOUNTER — Ambulatory Visit: Payer: Self-pay | Admitting: Cardiovascular Disease

## 2008-12-01 ENCOUNTER — Ambulatory Visit: Payer: Self-pay | Admitting: Cardiovascular Disease

## 2008-12-22 ENCOUNTER — Telehealth (INDEPENDENT_AMBULATORY_CARE_PROVIDER_SITE_OTHER): Payer: Self-pay | Admitting: *Deleted

## 2008-12-27 ENCOUNTER — Ambulatory Visit: Payer: Self-pay | Admitting: Pulmonary Disease

## 2008-12-27 ENCOUNTER — Telehealth: Payer: Self-pay | Admitting: Internal Medicine

## 2008-12-29 ENCOUNTER — Ambulatory Visit: Payer: Self-pay | Admitting: Cardiology

## 2009-01-13 ENCOUNTER — Ambulatory Visit: Payer: Self-pay | Admitting: Internal Medicine

## 2009-01-13 ENCOUNTER — Encounter: Payer: Self-pay | Admitting: Internal Medicine

## 2009-01-16 ENCOUNTER — Telehealth (INDEPENDENT_AMBULATORY_CARE_PROVIDER_SITE_OTHER): Payer: Self-pay | Admitting: *Deleted

## 2009-01-16 LAB — CONVERTED CEMR LAB
ALT: 24 units/L (ref 0–35)
AST: 22 units/L (ref 0–37)
TSH: 0.82 microintl units/mL (ref 0.35–5.50)
Total Bilirubin: 0.8 mg/dL (ref 0.3–1.2)
Total Protein: 6.9 g/dL (ref 6.0–8.3)

## 2009-01-25 ENCOUNTER — Telehealth (INDEPENDENT_AMBULATORY_CARE_PROVIDER_SITE_OTHER): Payer: Self-pay | Admitting: *Deleted

## 2009-01-30 ENCOUNTER — Telehealth (INDEPENDENT_AMBULATORY_CARE_PROVIDER_SITE_OTHER): Payer: Self-pay | Admitting: *Deleted

## 2009-02-03 ENCOUNTER — Ambulatory Visit: Payer: Self-pay | Admitting: Internal Medicine

## 2009-02-03 ENCOUNTER — Ambulatory Visit: Payer: Self-pay | Admitting: Cardiology

## 2009-02-06 LAB — CONVERTED CEMR LAB
CO2: 31 meq/L (ref 19–32)
Calcium: 9 mg/dL (ref 8.4–10.5)
Creatinine, Ser: 1.2 mg/dL (ref 0.4–1.2)

## 2009-02-08 ENCOUNTER — Telehealth (INDEPENDENT_AMBULATORY_CARE_PROVIDER_SITE_OTHER): Payer: Self-pay | Admitting: *Deleted

## 2009-02-10 ENCOUNTER — Encounter: Payer: Self-pay | Admitting: Internal Medicine

## 2009-02-21 ENCOUNTER — Encounter: Payer: Self-pay | Admitting: *Deleted

## 2009-03-02 ENCOUNTER — Ambulatory Visit: Payer: Self-pay | Admitting: Cardiovascular Disease

## 2009-03-02 LAB — CONVERTED CEMR LAB: POC INR: 2.8

## 2009-03-28 ENCOUNTER — Telehealth (INDEPENDENT_AMBULATORY_CARE_PROVIDER_SITE_OTHER): Payer: Self-pay | Admitting: *Deleted

## 2009-03-29 ENCOUNTER — Encounter: Payer: Self-pay | Admitting: *Deleted

## 2009-03-30 ENCOUNTER — Ambulatory Visit: Payer: Self-pay | Admitting: Cardiology

## 2009-04-11 ENCOUNTER — Ambulatory Visit: Payer: Self-pay | Admitting: Cardiology

## 2009-04-11 LAB — CONVERTED CEMR LAB: POC INR: 2.2

## 2009-04-15 IMAGING — CT CT ANGIO CHEST
2 of 4 series · 19 of 36 positions shown · IV contrast (120 ML OMNI 300)
Comparison: Chest radiographs done today and chest CT done 08/01/06.

CLINICAL DATA: Dyspnea.  History of cardiac ablation.  Question acute pulmonary embolism.
 CT ANGIOGRAPHY OF CHEST:
TECHNIQUE: Multidetector CT imaging of the chest was performed during bolus injection of intravenous contrast.  Multiplanar CT angiographic image reconstructions were generated to evaluate the vascular anatomy.
 Contrast:  120 cc Omnipaque 300.

[Series 2: pe · axial · 0.80mm/px · z∈[-249,-0]mm · 16 of 225 slices shown]
[im 13/225  lung]
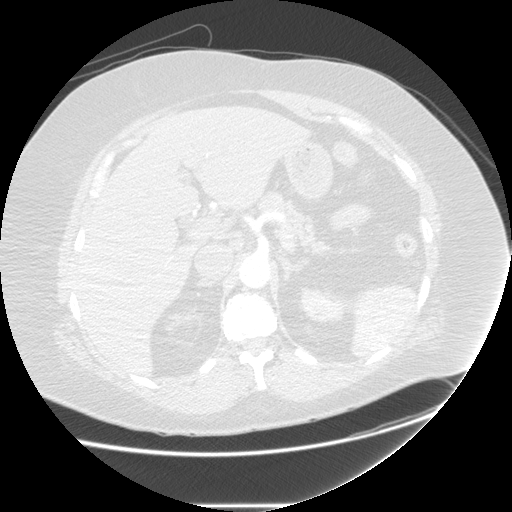
[im 25/225  mediastinal]
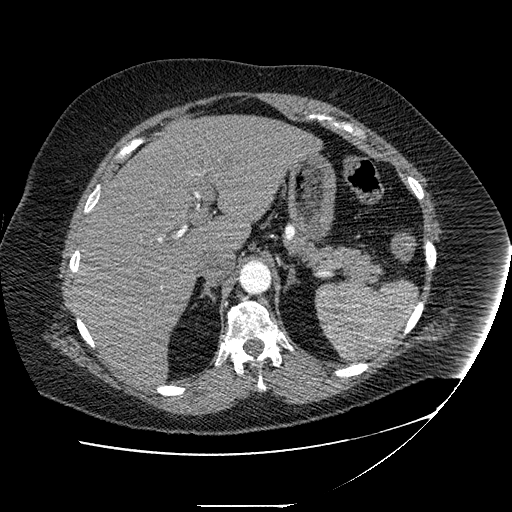
[im 38/225  lung]
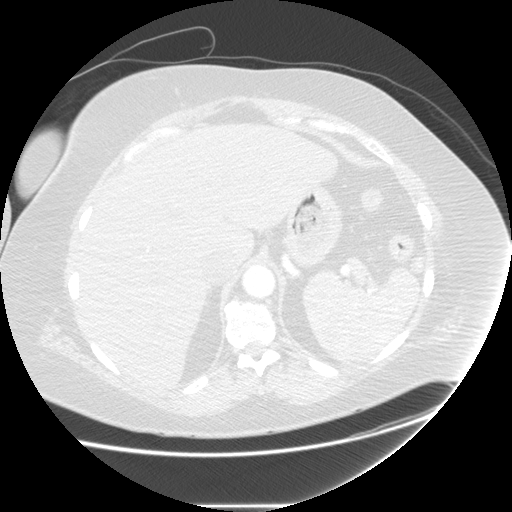
[im 50/225  mediastinal]
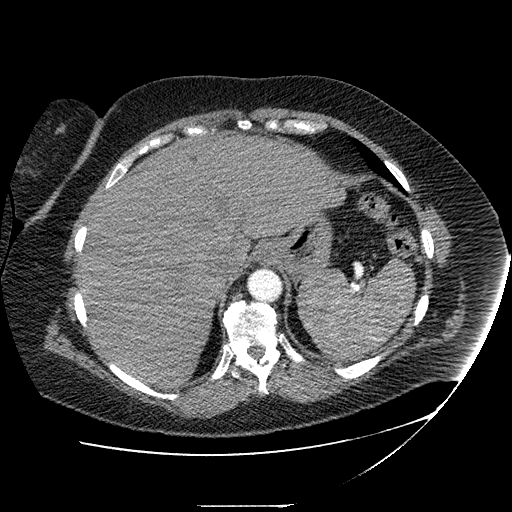
[im 63/225  lung]
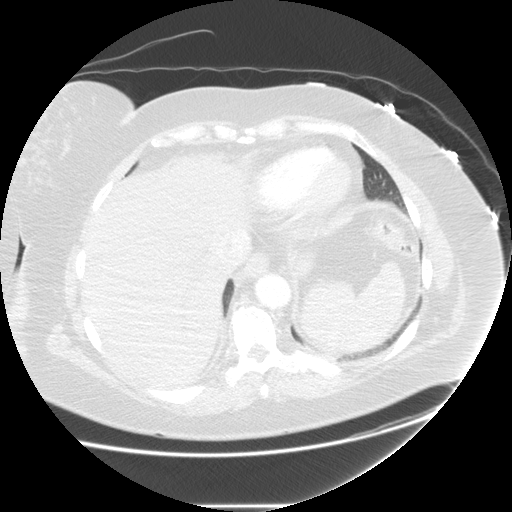
[im 75/225  mediastinal]
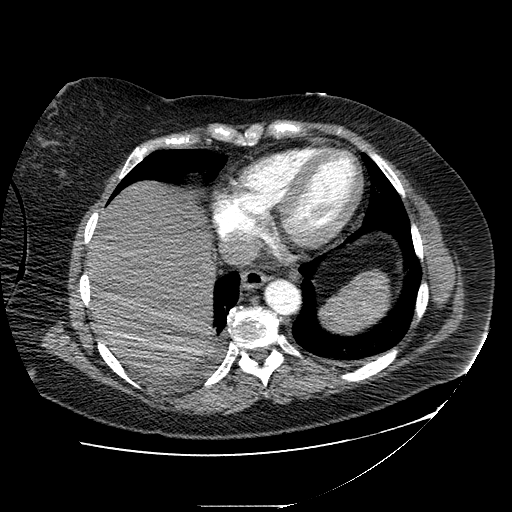
[im 88/225  lung]
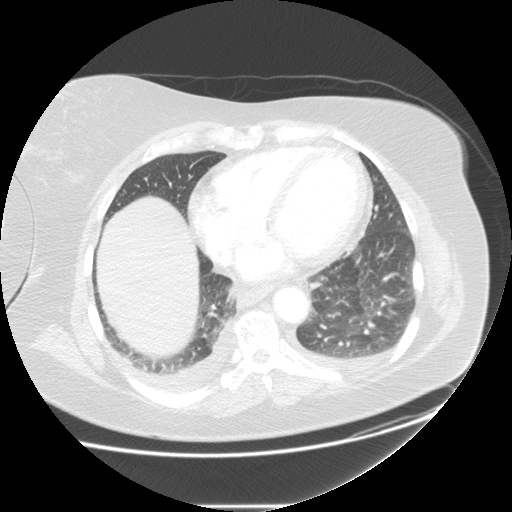
[im 100/225  mediastinal]
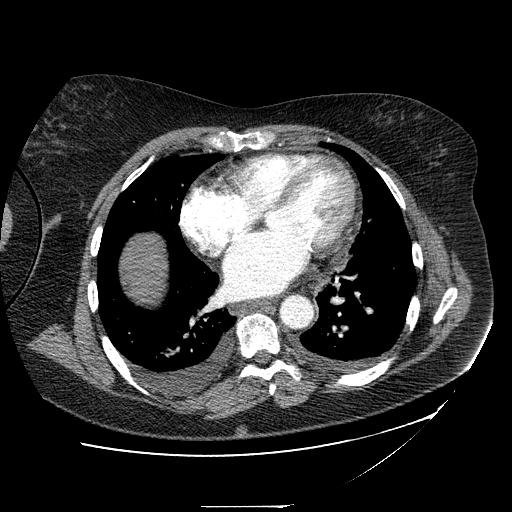
[im 125/225  lung]
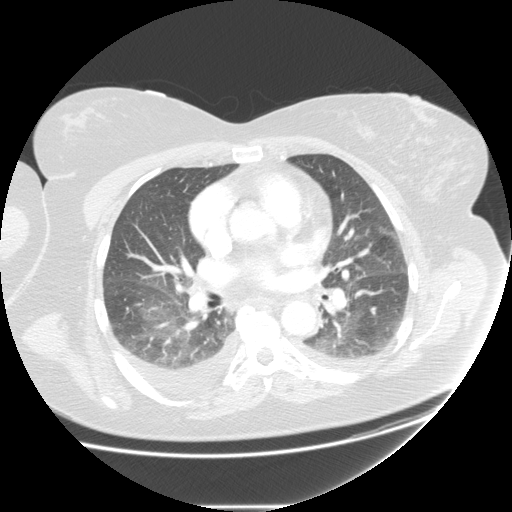
[im 137/225  mediastinal]
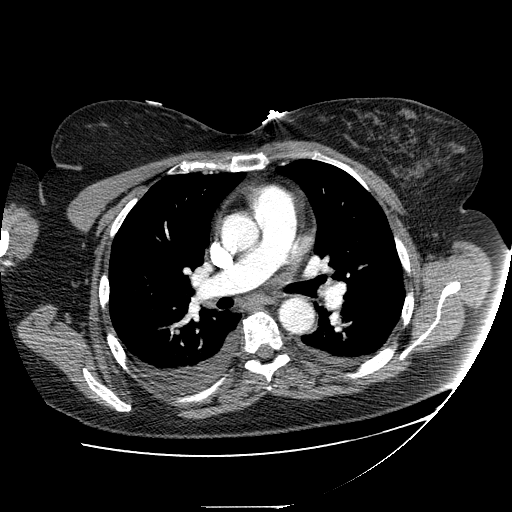
[im 150/225  lung]
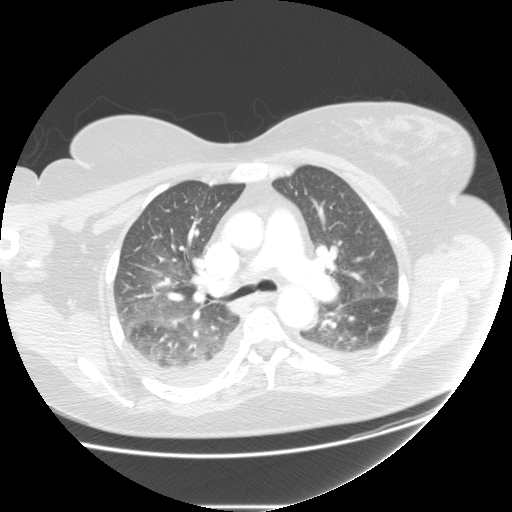
[im 162/225  mediastinal]
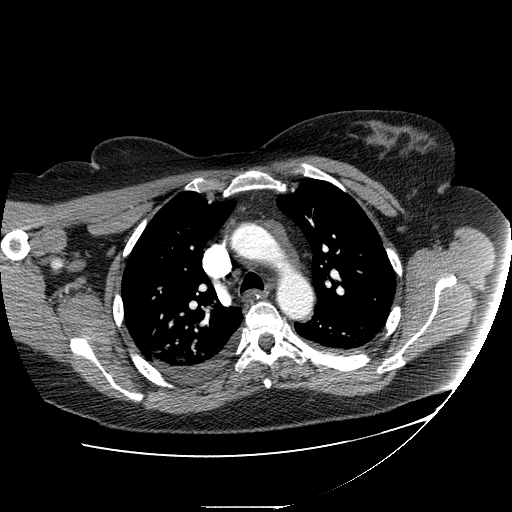
[im 175/225  lung]
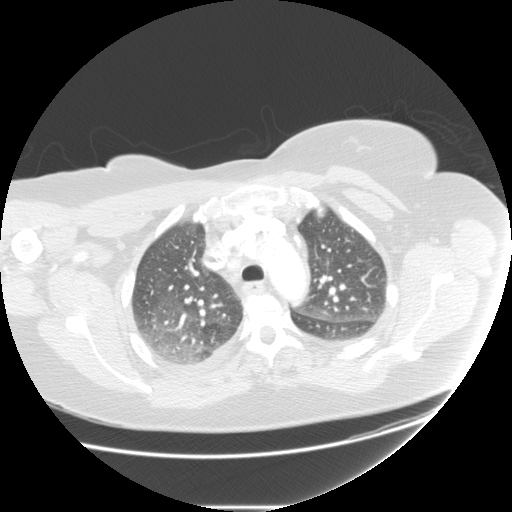
[im 187/225  mediastinal]
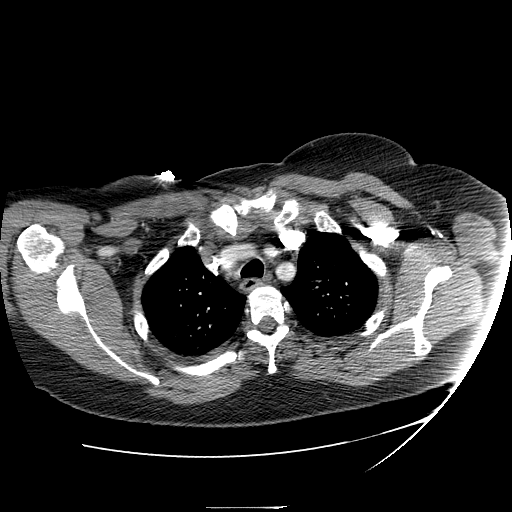
[im 200/225  lung]
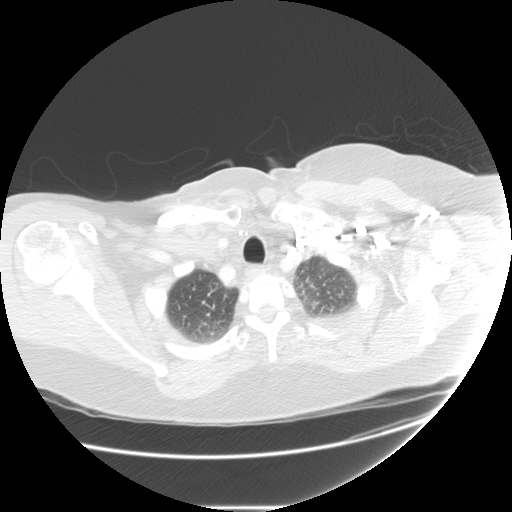
[im 212/225  mediastinal]
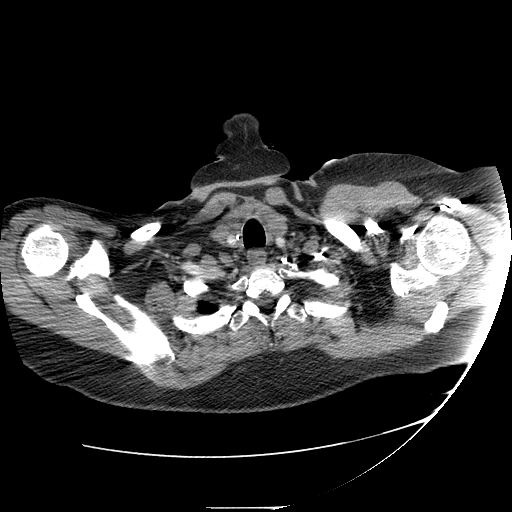

[Series 202: cor chest · coronal · 0.80mm/px · 3 of 254 slices shown]
[im 51/254  mediastinal]
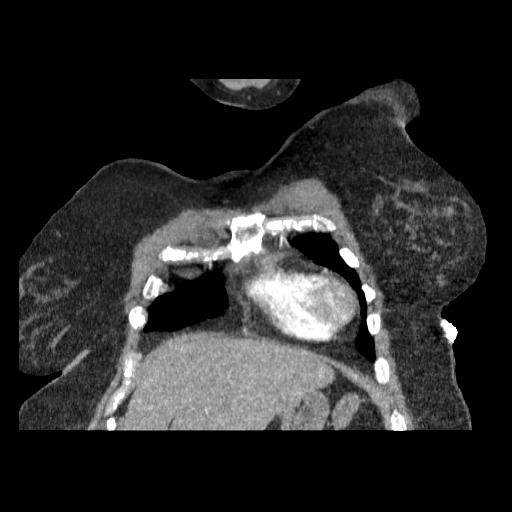
[im 102/254  mediastinal]
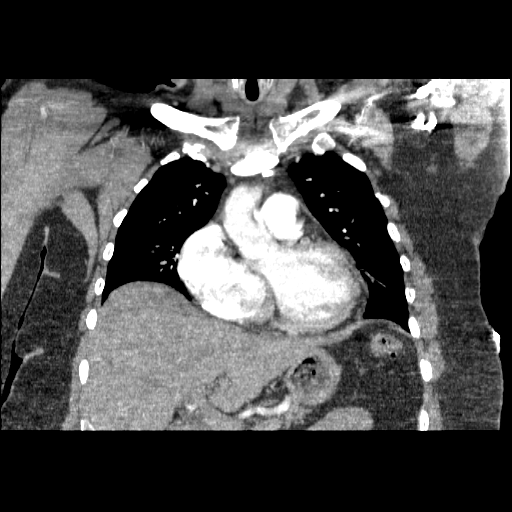
[im 152/254  mediastinal]
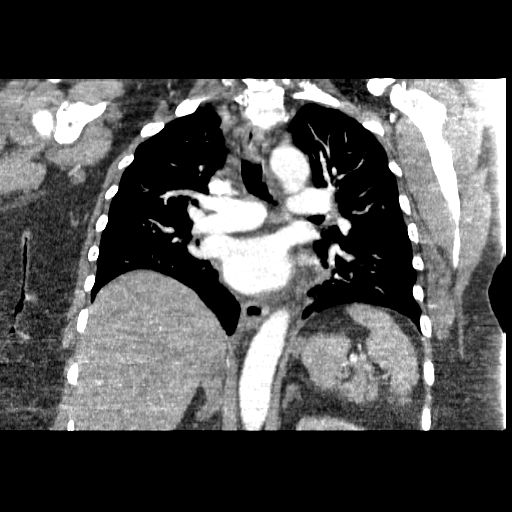

[19 of 36 positions shown; findings below may reference images not displayed]

FINDINGS: The pulmonary arteries are well opacified with contrast.  There is no evidence of acute pulmonary embolism.  The thoracic aorta appears normal.  There are stable post surgical changes at the thoracic inlet.  No enlarged mediastinal or hilar lymph nodes are present.  There are faint coronary artery calcifications.  New small bilateral pleural effusions are present.  There is no pericardial effusion.   There is a small hiatal hernia. 
 The lungs demonstrate dependent opacities bilaterally, right greater than left.  There is no confluent air space opacity or endobronchial lesion.  A right middle lobe nodule measuring 4 mm in diameter on image 56 appears stable.  Mild adjacent nodularity is noted on image 57.  There are no new or enlarging pulmonary nodules.
IMPRESSION: 1.  No evidence of acute pulmonary embolism.  
 2.  Small bilateral pleural effusions with dependent opacities in both lungs suggesting possible resolving edema or atelectasis.  
 3.  Stable small nodules in the right middle lobe.

## 2009-05-05 ENCOUNTER — Encounter: Payer: Self-pay | Admitting: Cardiology

## 2009-05-06 ENCOUNTER — Ambulatory Visit: Payer: Self-pay | Admitting: Family Medicine

## 2009-05-09 ENCOUNTER — Ambulatory Visit: Payer: Self-pay | Admitting: Cardiovascular Disease

## 2009-05-09 LAB — CONVERTED CEMR LAB: POC INR: 2.3

## 2009-06-06 ENCOUNTER — Ambulatory Visit: Payer: Self-pay | Admitting: Cardiology

## 2009-06-13 ENCOUNTER — Encounter: Payer: Self-pay | Admitting: Internal Medicine

## 2009-06-22 ENCOUNTER — Telehealth (INDEPENDENT_AMBULATORY_CARE_PROVIDER_SITE_OTHER): Payer: Self-pay | Admitting: *Deleted

## 2009-06-27 ENCOUNTER — Ambulatory Visit: Payer: Self-pay | Admitting: Cardiology

## 2009-06-27 LAB — CONVERTED CEMR LAB: POC INR: 2.8

## 2009-06-29 ENCOUNTER — Telehealth: Payer: Self-pay | Admitting: Internal Medicine

## 2009-07-07 ENCOUNTER — Encounter: Admission: RE | Admit: 2009-07-07 | Discharge: 2009-07-07 | Payer: Self-pay | Admitting: Internal Medicine

## 2009-07-18 ENCOUNTER — Telehealth (INDEPENDENT_AMBULATORY_CARE_PROVIDER_SITE_OTHER): Payer: Self-pay | Admitting: *Deleted

## 2009-07-20 ENCOUNTER — Encounter: Payer: Self-pay | Admitting: Internal Medicine

## 2009-07-21 ENCOUNTER — Encounter: Payer: Self-pay | Admitting: Internal Medicine

## 2009-07-21 ENCOUNTER — Encounter (INDEPENDENT_AMBULATORY_CARE_PROVIDER_SITE_OTHER): Payer: Self-pay | Admitting: Diagnostic Radiology

## 2009-07-21 ENCOUNTER — Encounter: Admission: RE | Admit: 2009-07-21 | Discharge: 2009-07-21 | Payer: Self-pay | Admitting: Internal Medicine

## 2009-07-24 ENCOUNTER — Ambulatory Visit: Payer: Self-pay | Admitting: Internal Medicine

## 2009-07-24 ENCOUNTER — Ambulatory Visit: Payer: Self-pay | Admitting: Cardiology

## 2009-07-24 LAB — CONVERTED CEMR LAB: POC INR: 3.3

## 2009-07-25 ENCOUNTER — Ambulatory Visit: Payer: Self-pay | Admitting: Internal Medicine

## 2009-07-25 DIAGNOSIS — Z860100 Personal history of colon polyps, unspecified: Secondary | ICD-10-CM | POA: Insufficient documentation

## 2009-07-25 DIAGNOSIS — Z853 Personal history of malignant neoplasm of breast: Secondary | ICD-10-CM | POA: Insufficient documentation

## 2009-07-28 ENCOUNTER — Encounter (INDEPENDENT_AMBULATORY_CARE_PROVIDER_SITE_OTHER): Payer: Self-pay | Admitting: *Deleted

## 2009-07-28 ENCOUNTER — Encounter: Admission: RE | Admit: 2009-07-28 | Discharge: 2009-07-28 | Payer: Self-pay | Admitting: Internal Medicine

## 2009-07-28 LAB — CONVERTED CEMR LAB: Hgb A1c MFr Bld: 6 % (ref 4.6–6.5)

## 2009-07-31 ENCOUNTER — Encounter: Payer: Self-pay | Admitting: Internal Medicine

## 2009-07-31 ENCOUNTER — Telehealth: Payer: Self-pay | Admitting: Internal Medicine

## 2009-08-01 ENCOUNTER — Ambulatory Visit: Admission: RE | Admit: 2009-08-01 | Discharge: 2009-09-22 | Payer: Self-pay | Admitting: Radiation Oncology

## 2009-08-02 ENCOUNTER — Encounter: Payer: Self-pay | Admitting: Internal Medicine

## 2009-08-07 ENCOUNTER — Telehealth: Payer: Self-pay | Admitting: Internal Medicine

## 2009-08-07 ENCOUNTER — Ambulatory Visit: Payer: Self-pay | Admitting: Cardiology

## 2009-08-08 ENCOUNTER — Telehealth (INDEPENDENT_AMBULATORY_CARE_PROVIDER_SITE_OTHER): Payer: Self-pay | Admitting: *Deleted

## 2009-08-08 ENCOUNTER — Encounter: Payer: Self-pay | Admitting: Internal Medicine

## 2009-08-14 ENCOUNTER — Telehealth (INDEPENDENT_AMBULATORY_CARE_PROVIDER_SITE_OTHER): Payer: Self-pay

## 2009-08-14 DIAGNOSIS — I6350 Cerebral infarction due to unspecified occlusion or stenosis of unspecified cerebral artery: Secondary | ICD-10-CM | POA: Insufficient documentation

## 2009-08-22 ENCOUNTER — Encounter (INDEPENDENT_AMBULATORY_CARE_PROVIDER_SITE_OTHER): Payer: Self-pay | Admitting: *Deleted

## 2009-08-23 ENCOUNTER — Ambulatory Visit: Payer: Self-pay | Admitting: Gastroenterology

## 2009-08-29 ENCOUNTER — Ambulatory Visit: Payer: Self-pay | Admitting: Gastroenterology

## 2009-08-29 ENCOUNTER — Encounter: Payer: Self-pay | Admitting: Internal Medicine

## 2009-08-29 ENCOUNTER — Ambulatory Visit (HOSPITAL_COMMUNITY): Admission: RE | Admit: 2009-08-29 | Discharge: 2009-08-29 | Payer: Self-pay | Admitting: Gastroenterology

## 2009-08-30 ENCOUNTER — Encounter: Admission: RE | Admit: 2009-08-30 | Discharge: 2009-08-30 | Payer: Self-pay | Admitting: Surgery

## 2009-08-30 ENCOUNTER — Ambulatory Visit (HOSPITAL_BASED_OUTPATIENT_CLINIC_OR_DEPARTMENT_OTHER): Admission: RE | Admit: 2009-08-30 | Discharge: 2009-08-30 | Payer: Self-pay | Admitting: Surgery

## 2009-09-01 ENCOUNTER — Encounter: Admission: RE | Admit: 2009-09-01 | Discharge: 2009-09-01 | Payer: Self-pay | Admitting: Surgery

## 2009-09-01 ENCOUNTER — Encounter: Payer: Self-pay | Admitting: Gastroenterology

## 2009-09-04 ENCOUNTER — Ambulatory Visit: Payer: Self-pay | Admitting: Cardiovascular Disease

## 2009-09-04 ENCOUNTER — Encounter (INDEPENDENT_AMBULATORY_CARE_PROVIDER_SITE_OTHER): Payer: Self-pay | Admitting: Cardiology

## 2009-09-04 ENCOUNTER — Telehealth (INDEPENDENT_AMBULATORY_CARE_PROVIDER_SITE_OTHER): Payer: Self-pay | Admitting: *Deleted

## 2009-09-05 ENCOUNTER — Encounter: Payer: Self-pay | Admitting: Gastroenterology

## 2009-09-06 ENCOUNTER — Ambulatory Visit: Payer: Self-pay | Admitting: Cardiology

## 2009-09-06 LAB — CONVERTED CEMR LAB: POC INR: 1.8

## 2009-09-07 ENCOUNTER — Encounter: Payer: Self-pay | Admitting: Internal Medicine

## 2009-09-08 ENCOUNTER — Ambulatory Visit: Payer: Self-pay | Admitting: Cardiovascular Disease

## 2009-09-08 ENCOUNTER — Ambulatory Visit: Payer: Self-pay | Admitting: Internal Medicine

## 2009-09-08 DIAGNOSIS — R740 Nonspecific elevation of levels of transaminase and lactic acid dehydrogenase [LDH]: Secondary | ICD-10-CM

## 2009-09-08 DIAGNOSIS — R7401 Elevation of levels of liver transaminase levels: Secondary | ICD-10-CM | POA: Insufficient documentation

## 2009-09-08 LAB — CONVERTED CEMR LAB: POC INR: 1.9

## 2009-09-13 ENCOUNTER — Telehealth (INDEPENDENT_AMBULATORY_CARE_PROVIDER_SITE_OTHER): Payer: Self-pay | Admitting: *Deleted

## 2009-09-13 ENCOUNTER — Encounter (INDEPENDENT_AMBULATORY_CARE_PROVIDER_SITE_OTHER): Payer: Self-pay | Admitting: *Deleted

## 2009-09-13 LAB — CONVERTED CEMR LAB
ALT: 38 units/L — ABNORMAL HIGH (ref 0–35)
AST: 30 units/L (ref 0–37)
Albumin: 3.5 g/dL (ref 3.5–5.2)
Alkaline Phosphatase: 68 units/L (ref 39–117)
Cholesterol: 172 mg/dL (ref 0–200)
LDL Cholesterol: 104 mg/dL — ABNORMAL HIGH (ref 0–99)
Total Protein: 6.5 g/dL (ref 6.0–8.3)
Triglycerides: 107 mg/dL (ref 0.0–149.0)

## 2009-09-14 ENCOUNTER — Encounter: Payer: Self-pay | Admitting: Internal Medicine

## 2009-09-18 ENCOUNTER — Ambulatory Visit: Payer: Self-pay | Admitting: Internal Medicine

## 2009-09-18 ENCOUNTER — Ambulatory Visit: Payer: Self-pay | Admitting: Oncology

## 2009-09-18 ENCOUNTER — Encounter (INDEPENDENT_AMBULATORY_CARE_PROVIDER_SITE_OTHER): Payer: Self-pay

## 2009-09-18 ENCOUNTER — Encounter: Payer: Self-pay | Admitting: Internal Medicine

## 2009-09-25 ENCOUNTER — Encounter: Admission: RE | Admit: 2009-09-25 | Discharge: 2009-09-25 | Payer: Self-pay | Admitting: Radiation Oncology

## 2009-09-29 ENCOUNTER — Ambulatory Visit: Admission: RE | Admit: 2009-09-29 | Discharge: 2009-11-10 | Payer: Self-pay | Admitting: Radiation Oncology

## 2009-10-02 ENCOUNTER — Encounter: Payer: Self-pay | Admitting: Internal Medicine

## 2009-10-02 ENCOUNTER — Ambulatory Visit: Payer: Self-pay | Admitting: Cardiology

## 2009-10-05 ENCOUNTER — Ambulatory Visit: Payer: Self-pay | Admitting: Internal Medicine

## 2009-10-08 LAB — CONVERTED CEMR LAB
Alkaline Phosphatase: 74 units/L (ref 39–117)
Bilirubin, Direct: 0.1 mg/dL (ref 0.0–0.3)
Cholesterol: 194 mg/dL (ref 0–200)
LDL Cholesterol: 128 mg/dL — ABNORMAL HIGH (ref 0–99)
Total Bilirubin: 0.9 mg/dL (ref 0.3–1.2)
Total CHOL/HDL Ratio: 4
VLDL: 17.8 mg/dL (ref 0.0–40.0)

## 2009-10-09 ENCOUNTER — Encounter (INDEPENDENT_AMBULATORY_CARE_PROVIDER_SITE_OTHER): Payer: Self-pay | Admitting: *Deleted

## 2009-10-10 ENCOUNTER — Telehealth (INDEPENDENT_AMBULATORY_CARE_PROVIDER_SITE_OTHER): Payer: Self-pay | Admitting: *Deleted

## 2009-10-10 ENCOUNTER — Ambulatory Visit: Payer: Self-pay | Admitting: Family

## 2009-10-10 ENCOUNTER — Ambulatory Visit: Payer: Self-pay | Admitting: Internal Medicine

## 2009-10-12 ENCOUNTER — Telehealth (INDEPENDENT_AMBULATORY_CARE_PROVIDER_SITE_OTHER): Payer: Self-pay | Admitting: *Deleted

## 2009-10-19 ENCOUNTER — Ambulatory Visit: Payer: Self-pay | Admitting: Oncology

## 2009-10-23 ENCOUNTER — Encounter: Payer: Self-pay | Admitting: Internal Medicine

## 2009-10-23 LAB — CBC WITH DIFFERENTIAL/PLATELET
BASO%: 0.7 % (ref 0.0–2.0)
HCT: 38.7 % (ref 34.8–46.6)
MCHC: 33.6 g/dL (ref 31.5–36.0)
MONO#: 0.6 10*3/uL (ref 0.1–0.9)
NEUT%: 66.3 % (ref 38.4–76.8)
RDW: 14.9 % — ABNORMAL HIGH (ref 11.2–14.5)
WBC: 4.8 10*3/uL (ref 3.9–10.3)
lymph#: 0.8 10*3/uL — ABNORMAL LOW (ref 0.9–3.3)

## 2009-10-23 LAB — BASIC METABOLIC PANEL
CO2: 24 mEq/L (ref 19–32)
Chloride: 107 mEq/L (ref 96–112)
Potassium: 4.9 mEq/L (ref 3.5–5.3)
Sodium: 142 mEq/L (ref 135–145)

## 2009-10-24 ENCOUNTER — Ambulatory Visit: Payer: Self-pay | Admitting: Internal Medicine

## 2009-11-06 ENCOUNTER — Encounter: Payer: Self-pay | Admitting: Internal Medicine

## 2009-11-09 ENCOUNTER — Telehealth: Payer: Self-pay | Admitting: Internal Medicine

## 2009-11-10 ENCOUNTER — Telehealth (INDEPENDENT_AMBULATORY_CARE_PROVIDER_SITE_OTHER): Payer: Self-pay | Admitting: *Deleted

## 2009-11-10 ENCOUNTER — Encounter: Payer: Self-pay | Admitting: Internal Medicine

## 2009-11-10 LAB — CBC WITH DIFFERENTIAL/PLATELET
Basophils Absolute: 0 10*3/uL (ref 0.0–0.1)
EOS%: 2.9 % (ref 0.0–7.0)
Eosinophils Absolute: 0.1 10*3/uL (ref 0.0–0.5)
HGB: 12.7 g/dL (ref 11.6–15.9)
LYMPH%: 16.6 % (ref 14.0–49.7)
MCH: 29.4 pg (ref 25.1–34.0)
MCV: 87.2 fL (ref 79.5–101.0)
MONO%: 11.1 % (ref 0.0–14.0)
NEUT#: 3.2 10*3/uL (ref 1.5–6.5)
Platelets: 197 10*3/uL (ref 145–400)
RBC: 4.32 10*6/uL (ref 3.70–5.45)
RDW: 15.2 % — ABNORMAL HIGH (ref 11.2–14.5)

## 2009-11-10 LAB — COMPREHENSIVE METABOLIC PANEL
AST: 20 U/L (ref 0–37)
Alkaline Phosphatase: 70 U/L (ref 39–117)
BUN: 22 mg/dL (ref 6–23)
Creatinine, Ser: 1.14 mg/dL (ref 0.40–1.20)
Glucose, Bld: 93 mg/dL (ref 70–99)
Potassium: 4.4 mEq/L (ref 3.5–5.3)
Total Bilirubin: 0.6 mg/dL (ref 0.3–1.2)

## 2009-11-21 ENCOUNTER — Ambulatory Visit: Payer: Self-pay | Admitting: Cardiovascular Disease

## 2009-11-21 LAB — CONVERTED CEMR LAB: POC INR: 2.6

## 2009-11-28 ENCOUNTER — Telehealth: Payer: Self-pay | Admitting: Internal Medicine

## 2009-12-06 ENCOUNTER — Ambulatory Visit: Payer: Self-pay | Admitting: Oncology

## 2009-12-07 ENCOUNTER — Ambulatory Visit: Payer: Self-pay | Admitting: Internal Medicine

## 2009-12-07 DIAGNOSIS — E6609 Other obesity due to excess calories: Secondary | ICD-10-CM | POA: Insufficient documentation

## 2009-12-08 ENCOUNTER — Encounter: Payer: Self-pay | Admitting: Internal Medicine

## 2009-12-08 LAB — CBC WITH DIFFERENTIAL/PLATELET
Basophils Absolute: 0 10*3/uL (ref 0.0–0.1)
HCT: 37.3 % (ref 34.8–46.6)
HGB: 12.5 g/dL (ref 11.6–15.9)
MONO#: 0.5 10*3/uL (ref 0.1–0.9)
NEUT#: 3.9 10*3/uL (ref 1.5–6.5)
NEUT%: 74.6 % (ref 38.4–76.8)
RDW: 15.9 % — ABNORMAL HIGH (ref 11.2–14.5)
WBC: 5.3 10*3/uL (ref 3.9–10.3)
lymph#: 0.7 10*3/uL — ABNORMAL LOW (ref 0.9–3.3)

## 2009-12-08 LAB — BASIC METABOLIC PANEL
BUN: 24 mg/dL — ABNORMAL HIGH (ref 6–23)
CO2: 24 mEq/L (ref 19–32)
Chloride: 108 mEq/L (ref 96–112)
Creatinine, Ser: 1.18 mg/dL (ref 0.40–1.20)
Potassium: 4.2 mEq/L (ref 3.5–5.3)

## 2009-12-13 ENCOUNTER — Telehealth: Payer: Self-pay | Admitting: Internal Medicine

## 2009-12-13 ENCOUNTER — Encounter: Payer: Self-pay | Admitting: Internal Medicine

## 2009-12-13 IMAGING — MG MM SCREEN MAMMOGRAM BILATERAL
4 series · 4 of 4 positions shown · non-contrast
Comparison: none

DG SCREEN MAMMOGRAM BILATERAL
Bilateral CC and MLO view(s) were taken.

DIGITAL SCREENING MAMMOGRAM WITH CAD:
The breast tissue is heterogeneously dense.  No masses or malignant type calcifications are 
identified.

[R CC]
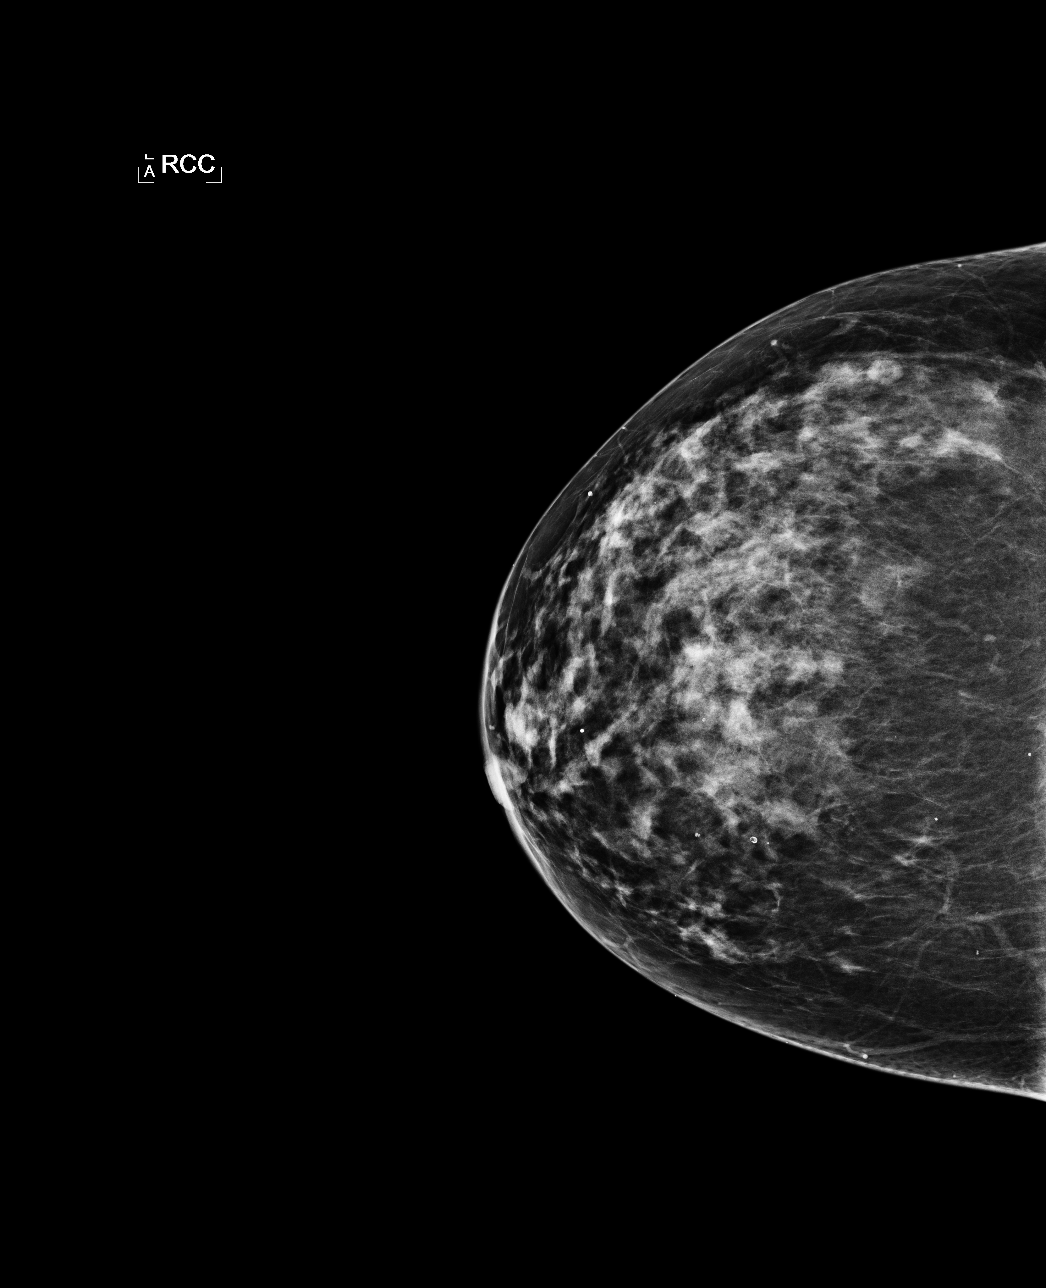

[L CC]
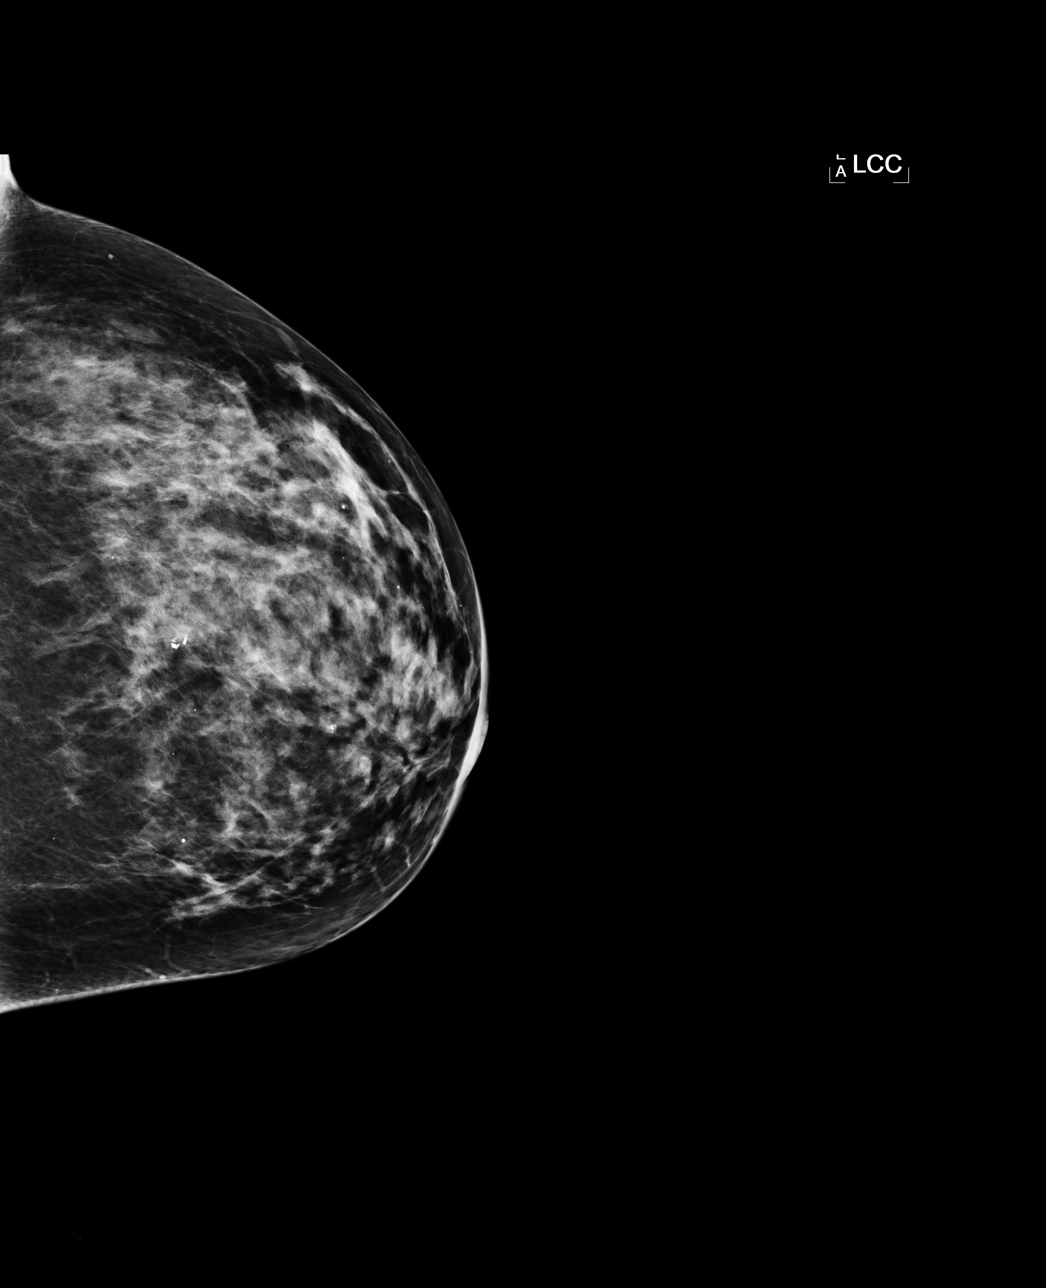

[L MLO]
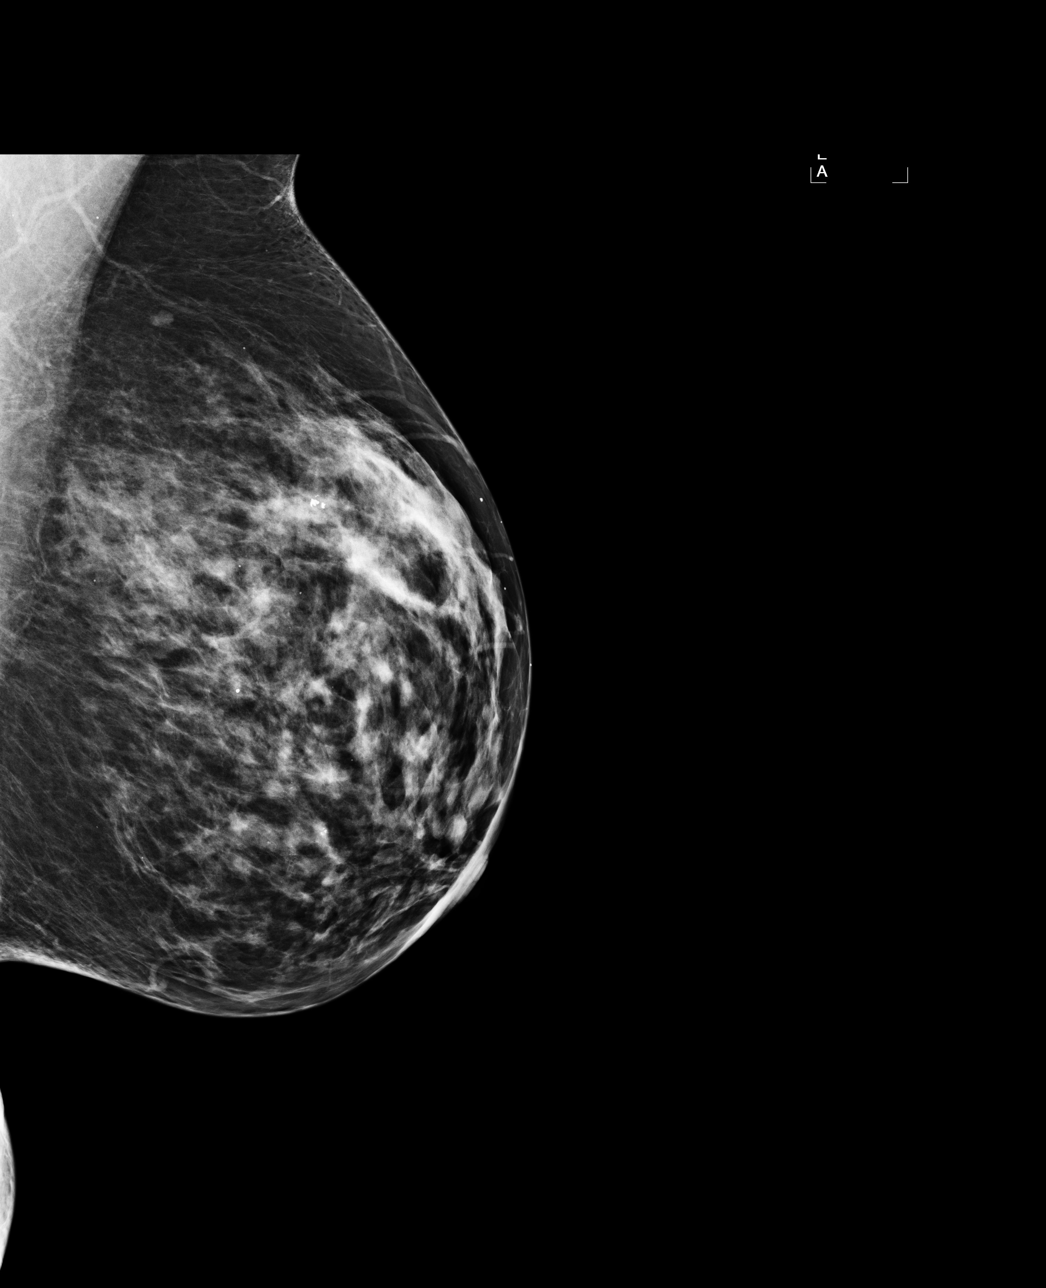

[R MLO]
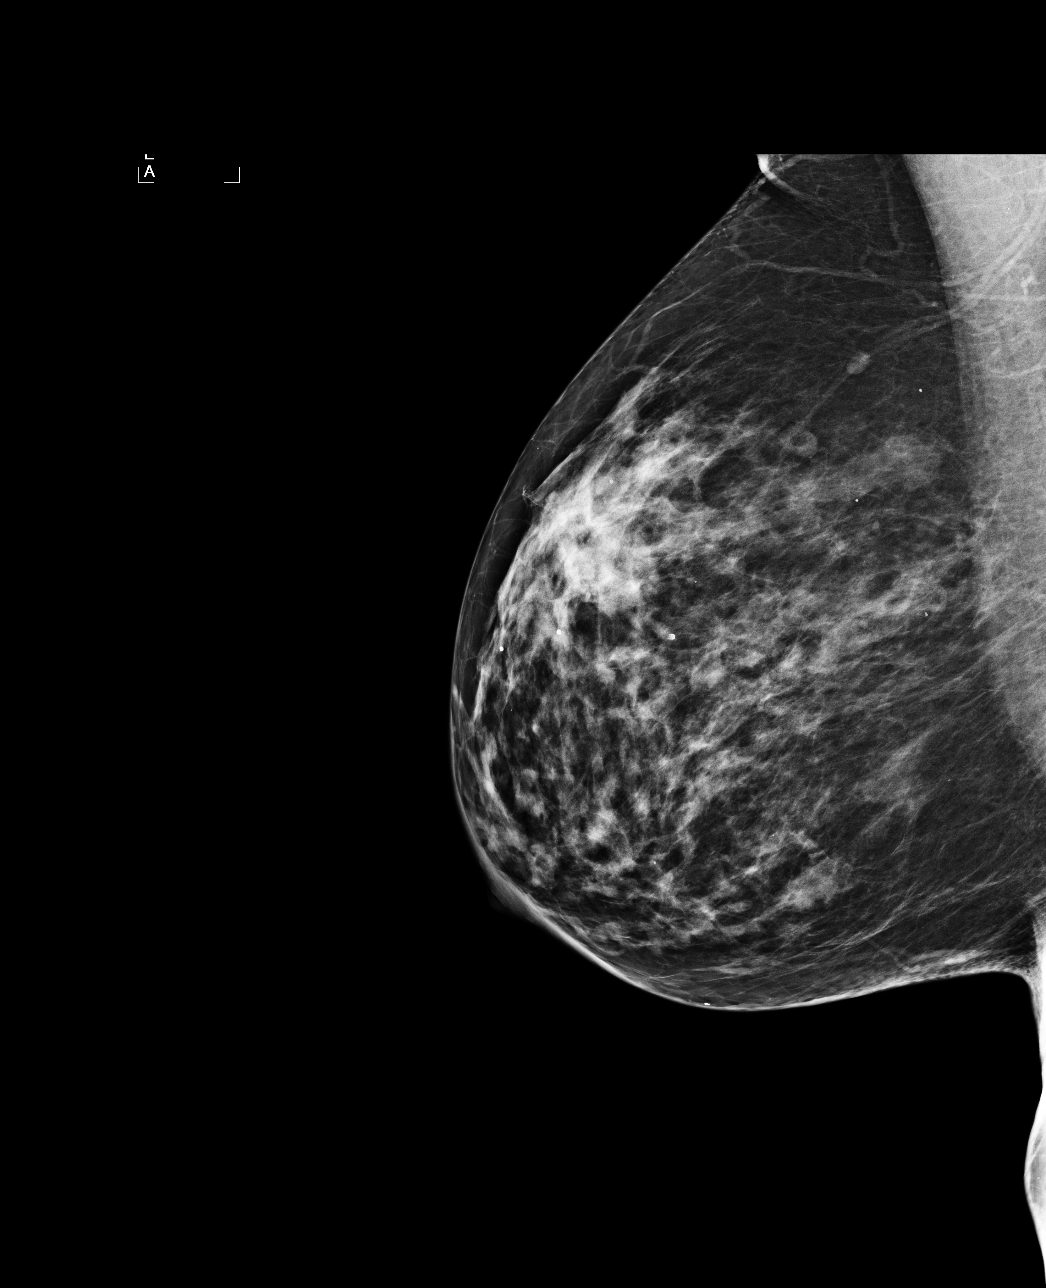

[4 of 4 positions shown; findings below may reference images not displayed]

IMPRESSION: No specific mammographic evidence of malignancy.  Next screening mammogram is recommended in one 
year.

ASSESSMENT: Negative - BI-RADS 1

Screening mammogram in 1 year.
ANALYZED BY COMPUTER AIDED DETECTION. , THIS PROCEDURE WAS A DIGITAL MAMMOGRAM.

## 2009-12-14 ENCOUNTER — Ambulatory Visit: Payer: Self-pay | Admitting: Internal Medicine

## 2009-12-14 LAB — CONVERTED CEMR LAB: POC INR: 2.7

## 2009-12-15 ENCOUNTER — Ambulatory Visit: Payer: Self-pay | Admitting: Internal Medicine

## 2009-12-19 LAB — CONVERTED CEMR LAB
ALT: 35 units/L (ref 0–35)
AST: 26 units/L (ref 0–37)
Cholesterol: 190 mg/dL (ref 0–200)
HDL: 53.6 mg/dL (ref >39.00)
Hgb A1c MFr Bld: 6 % (ref 4.6–6.5)
Total CK: 61 units/L (ref 7–177)
Total Protein: 6.8 g/dL (ref 6.0–8.3)
Triglycerides: 135 mg/dL (ref 0.0–149.0)
VLDL: 27 mg/dL (ref 0.0–40.0)

## 2010-01-01 ENCOUNTER — Encounter: Payer: Self-pay | Admitting: Internal Medicine

## 2010-01-01 LAB — COMPREHENSIVE METABOLIC PANEL
Albumin: 4.1 g/dL (ref 3.5–5.2)
BUN: 22 mg/dL (ref 6–23)
CO2: 19 mEq/L (ref 19–32)
Calcium: 9 mg/dL (ref 8.4–10.5)
Chloride: 106 mEq/L (ref 96–112)
Creatinine, Ser: 1.31 mg/dL — ABNORMAL HIGH (ref 0.40–1.20)
Glucose, Bld: 118 mg/dL — ABNORMAL HIGH (ref 70–99)

## 2010-01-01 LAB — CBC WITH DIFFERENTIAL/PLATELET
BASO%: 1.2 % (ref 0.0–2.0)
Basophils Absolute: 0.1 10*3/uL (ref 0.0–0.1)
Eosinophils Absolute: 0.2 10*3/uL (ref 0.0–0.5)
HCT: 39.7 % (ref 34.8–46.6)
HGB: 12.8 g/dL (ref 11.6–15.9)
LYMPH%: 15.9 % (ref 14.0–49.7)
MCHC: 32.2 g/dL (ref 31.5–36.0)
MONO#: 0.5 10*3/uL (ref 0.1–0.9)
NEUT%: 70 % (ref 38.4–76.8)
Platelets: 209 10*3/uL (ref 145–400)
WBC: 5.2 10*3/uL (ref 3.9–10.3)

## 2010-01-04 ENCOUNTER — Encounter: Payer: Self-pay | Admitting: Internal Medicine

## 2010-01-09 ENCOUNTER — Ambulatory Visit: Payer: Self-pay | Admitting: Cardiovascular Disease

## 2010-01-11 ENCOUNTER — Telehealth (INDEPENDENT_AMBULATORY_CARE_PROVIDER_SITE_OTHER): Payer: Self-pay | Admitting: *Deleted

## 2010-01-16 ENCOUNTER — Ambulatory Visit (HOSPITAL_COMMUNITY): Admission: RE | Admit: 2010-01-16 | Discharge: 2010-01-16 | Payer: Self-pay | Admitting: Surgery

## 2010-01-16 ENCOUNTER — Ambulatory Visit: Payer: Self-pay | Admitting: Internal Medicine

## 2010-01-16 DIAGNOSIS — N951 Menopausal and female climacteric states: Secondary | ICD-10-CM | POA: Insufficient documentation

## 2010-01-18 ENCOUNTER — Ambulatory Visit (HOSPITAL_COMMUNITY): Admission: RE | Admit: 2010-01-18 | Discharge: 2010-01-18 | Payer: Self-pay | Admitting: Surgery

## 2010-01-24 ENCOUNTER — Ambulatory Visit: Payer: Self-pay | Admitting: Internal Medicine

## 2010-02-01 ENCOUNTER — Ambulatory Visit: Payer: Self-pay | Admitting: Internal Medicine

## 2010-02-13 ENCOUNTER — Ambulatory Visit: Payer: Self-pay | Admitting: Internal Medicine

## 2010-02-13 DIAGNOSIS — R609 Edema, unspecified: Secondary | ICD-10-CM | POA: Insufficient documentation

## 2010-02-26 ENCOUNTER — Encounter: Admission: RE | Admit: 2010-02-26 | Discharge: 2010-02-26 | Payer: Self-pay | Admitting: Surgery

## 2010-03-01 ENCOUNTER — Ambulatory Visit: Payer: Self-pay | Admitting: Internal Medicine

## 2010-03-15 ENCOUNTER — Ambulatory Visit: Payer: Self-pay | Admitting: Internal Medicine

## 2010-03-15 DIAGNOSIS — M1710 Unilateral primary osteoarthritis, unspecified knee: Secondary | ICD-10-CM | POA: Insufficient documentation

## 2010-03-29 ENCOUNTER — Ambulatory Visit: Payer: Self-pay | Admitting: Internal Medicine

## 2010-04-19 ENCOUNTER — Ambulatory Visit: Payer: Self-pay | Admitting: Internal Medicine

## 2010-04-25 ENCOUNTER — Telehealth: Payer: Self-pay | Admitting: Internal Medicine

## 2010-04-26 ENCOUNTER — Ambulatory Visit: Payer: Self-pay | Admitting: Cardiovascular Disease

## 2010-04-26 LAB — CONVERTED CEMR LAB: POC INR: 2.8

## 2010-05-01 IMAGING — CR DG CHEST 1V PORT
1 series · 1 of 1 positions shown · non-contrast
Comparison: Chest x-ray and CT chest 09/13/2007

CLINICAL DATA: Atrial fibrillation with fever and cough.

PORTABLE CHEST - 1 VIEW

[view not recorded]
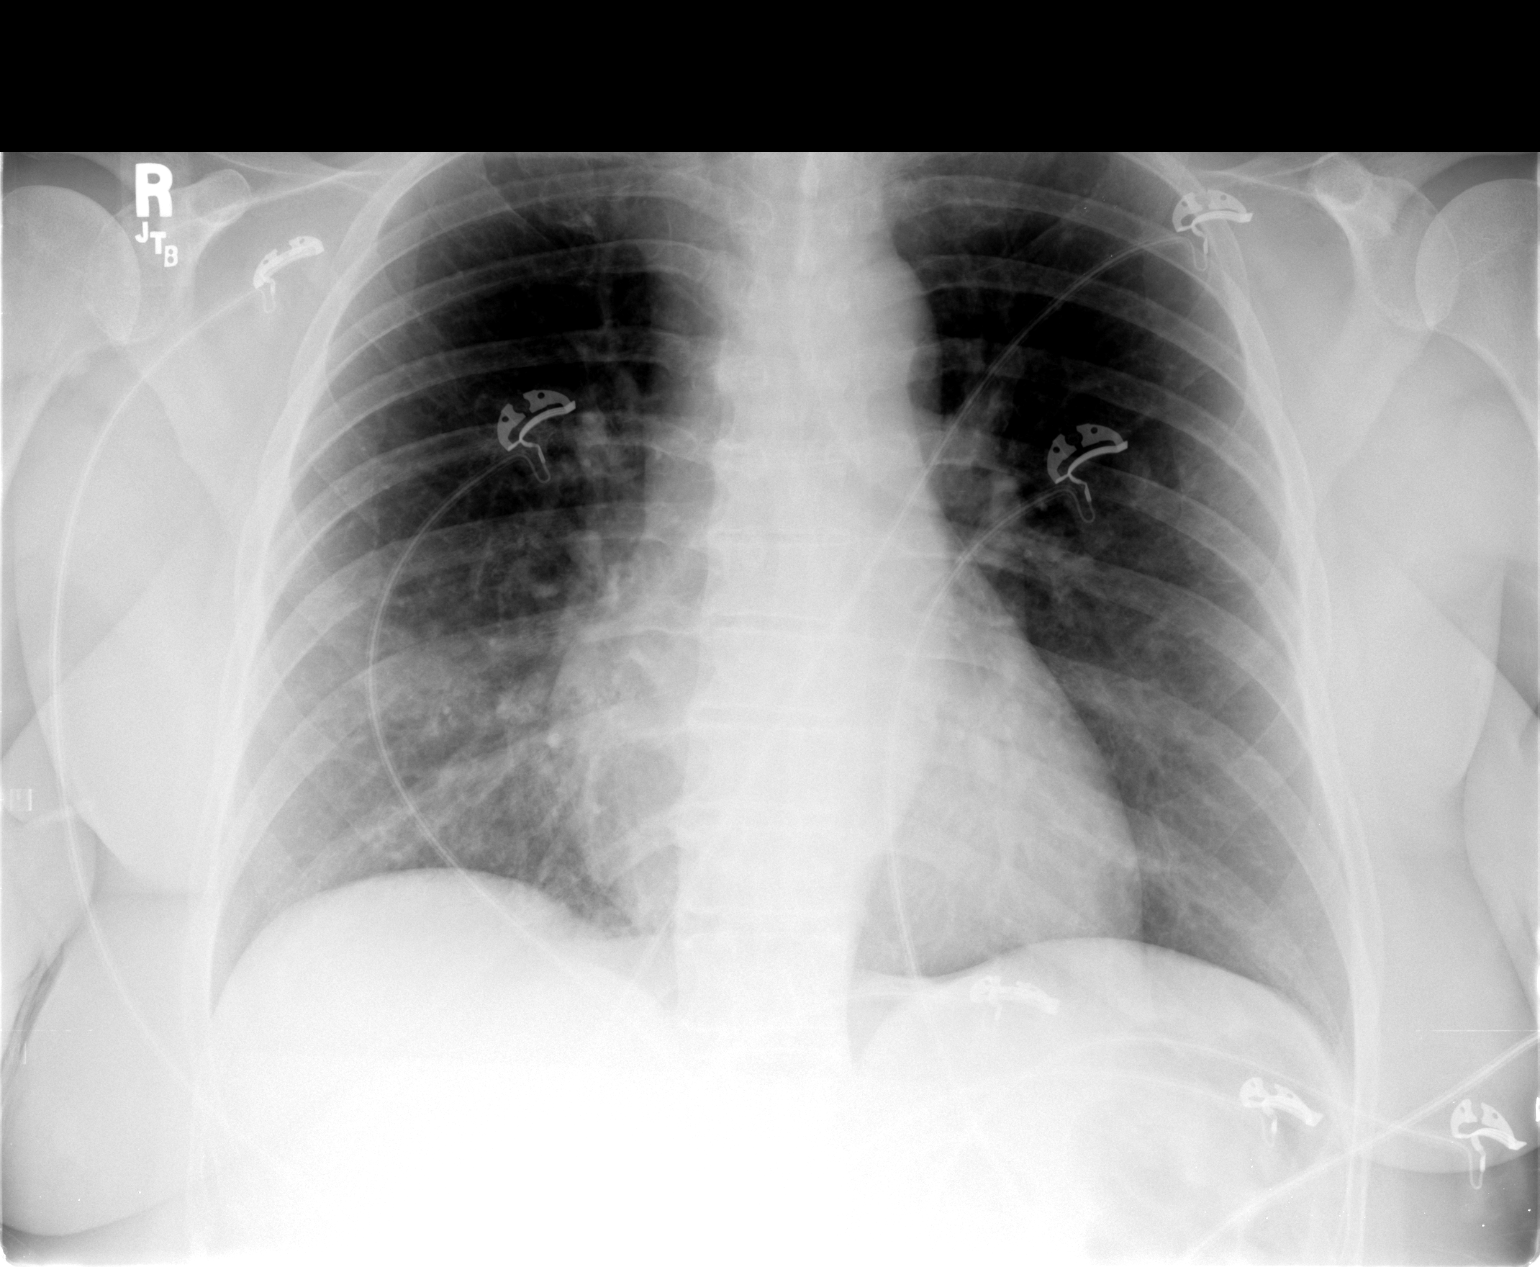

[1 of 1 positions shown; findings below may reference images not displayed]

FINDINGS: Surgical clips are seen in the right paratracheal region.
Trachea is midline.  Heart size normal.  There is a focal airspace
opacity in the right mid lung zone.  Left lung clear.  No pleural
fluid.
IMPRESSION: Focal airspace disease in the right mid lung zone may be due to
pneumonia or pulmonary hemorrhage.  Mass cannot be excluded.
Follow-up PA and lateral views of the chest recommended to ensure
resolution, as clinically indicated.

## 2010-05-03 IMAGING — CR DG CHEST 2V
2 series · 2 of 2 positions shown · non-contrast
Comparison: Chest x-ray of 09/28/2008

CLINICAL DATA: Cough, possible pneumonia

CHEST - 2 VIEW

[w chest pa]
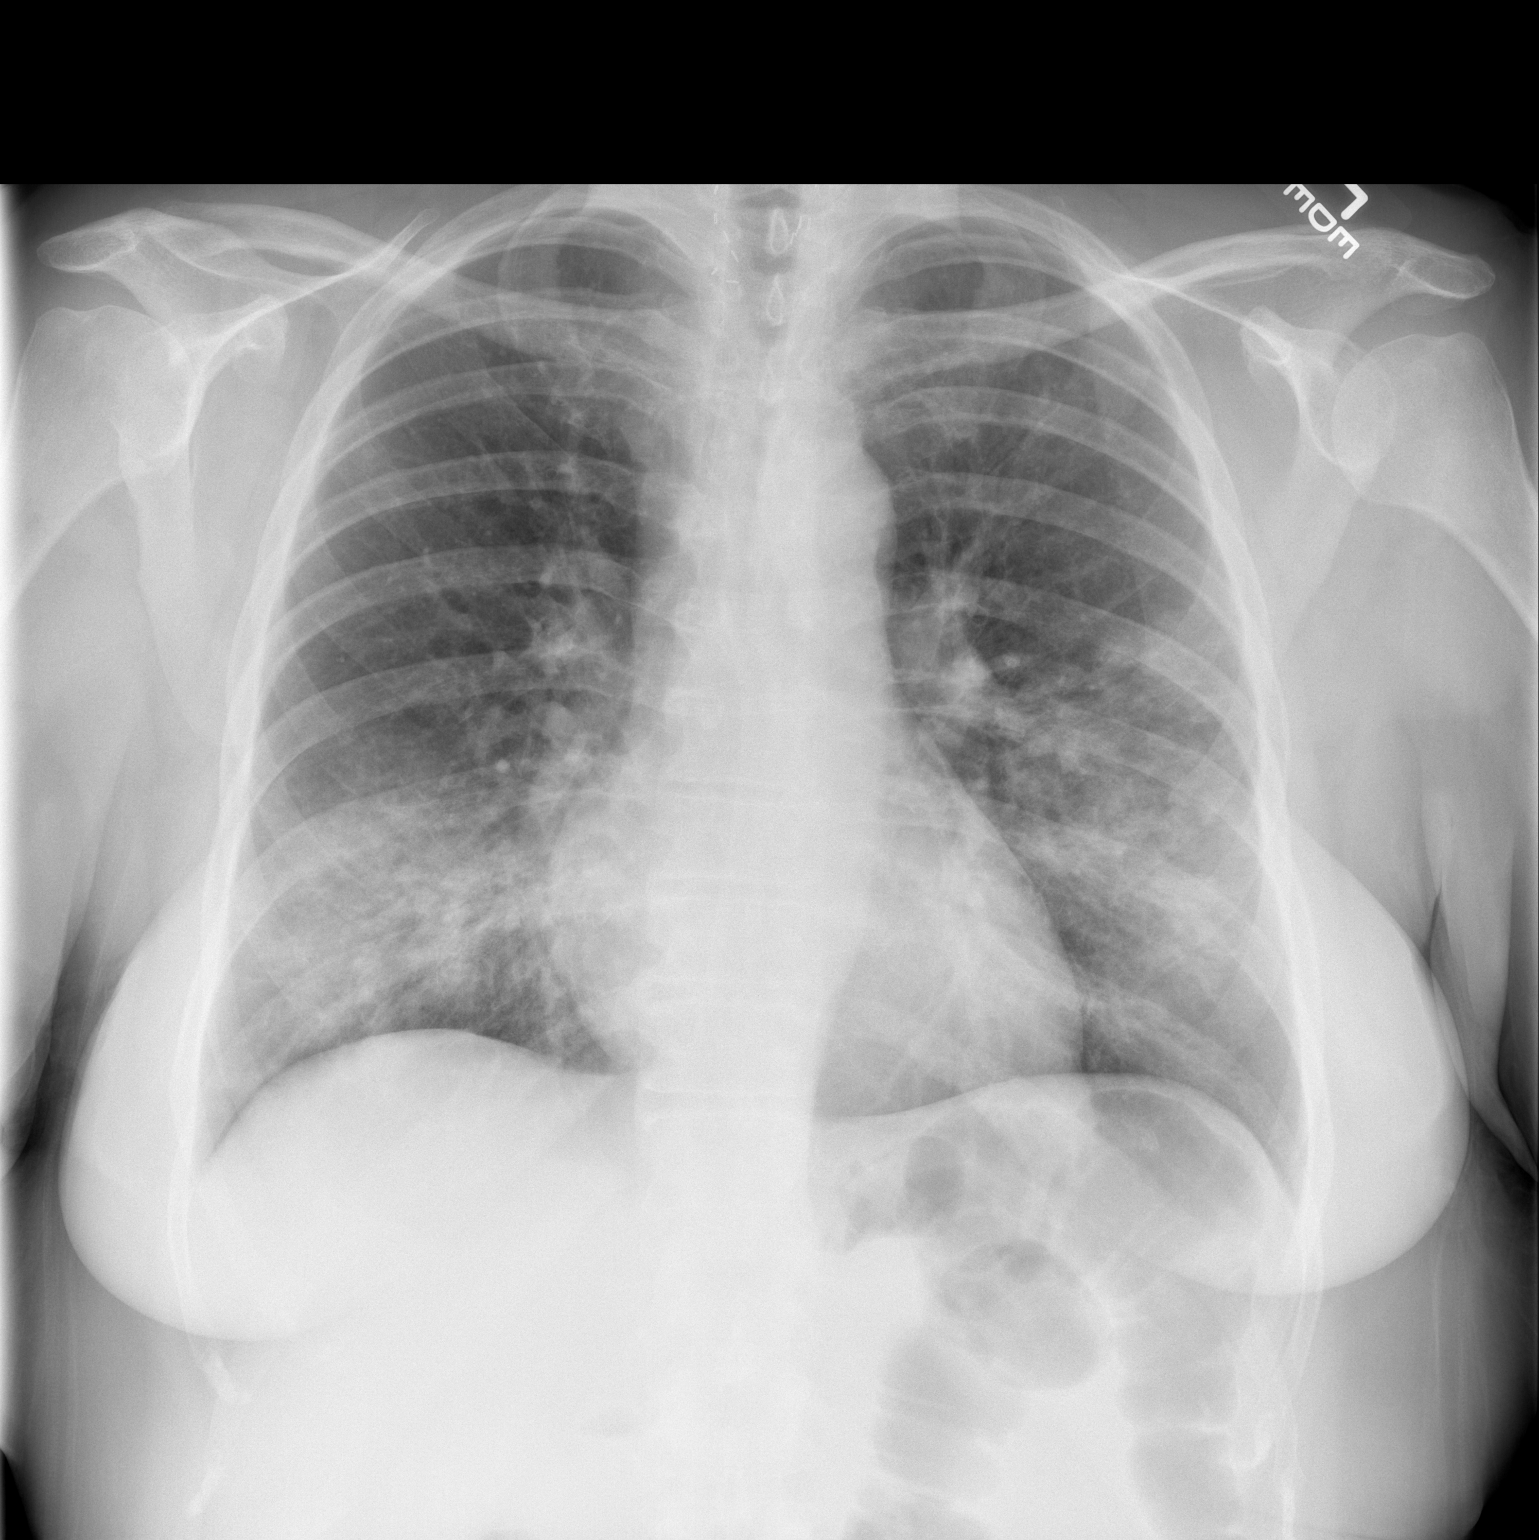

[w chest lat]
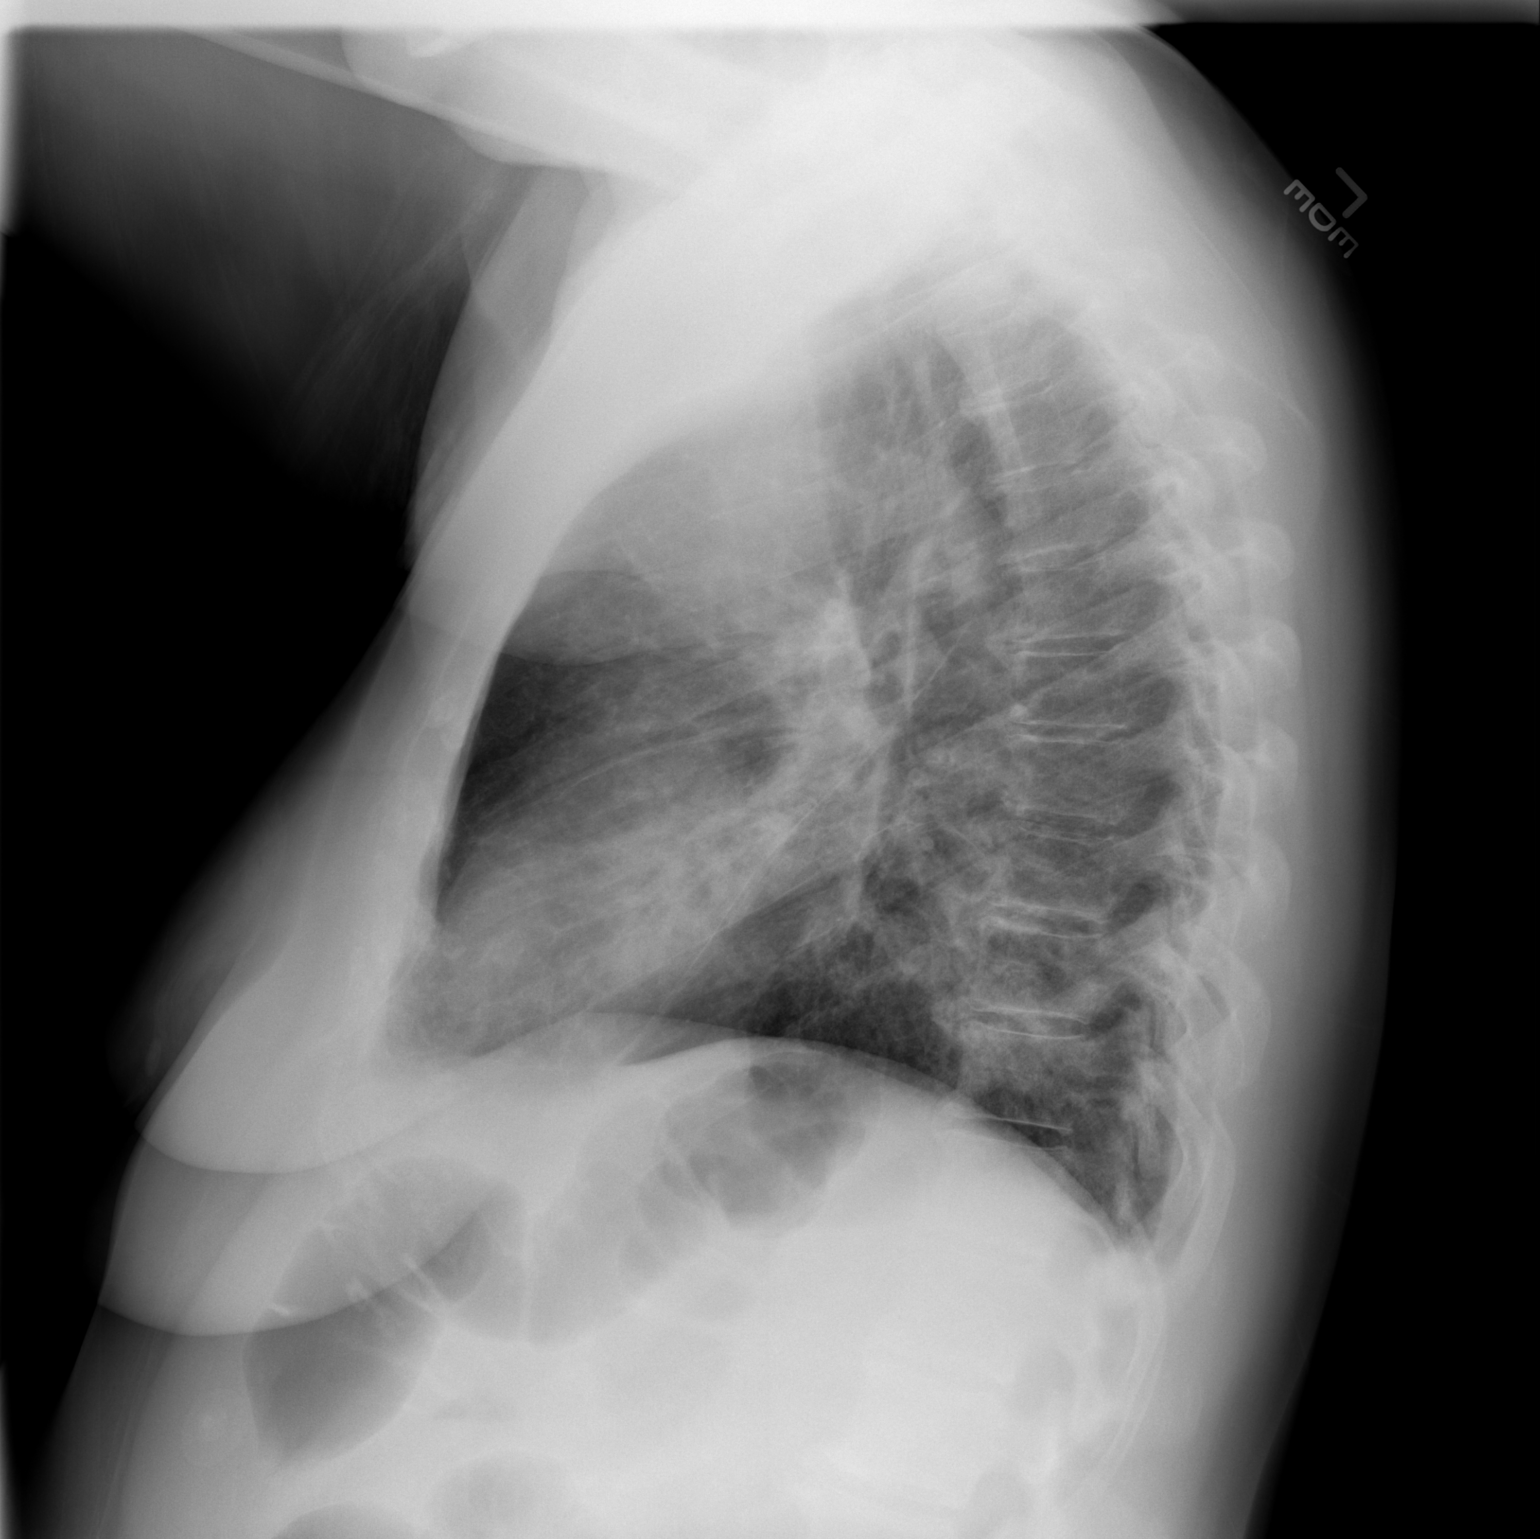

[2 of 2 positions shown; findings below may reference images not displayed]

FINDINGS: There is parenchymal opacity at both lung bases primarily
involving the right middle lobe, lingula, possibly the lower lobes,
consistent with pneumonia.  No effusion is seen.  The heart is
within normal limits in size.  No acute bony abnormality is noted.
IMPRESSION: Worsening of bilateral lower lobe, lingular, and right middle lobe
pneumonia.

## 2010-05-04 IMAGING — CR DG CHEST 2V
2 series · 2 of 2 positions shown · non-contrast
Comparison: 09/30/2008

CLINICAL DATA: Fever, cough

CHEST - 2 VIEW

[w chest pa]
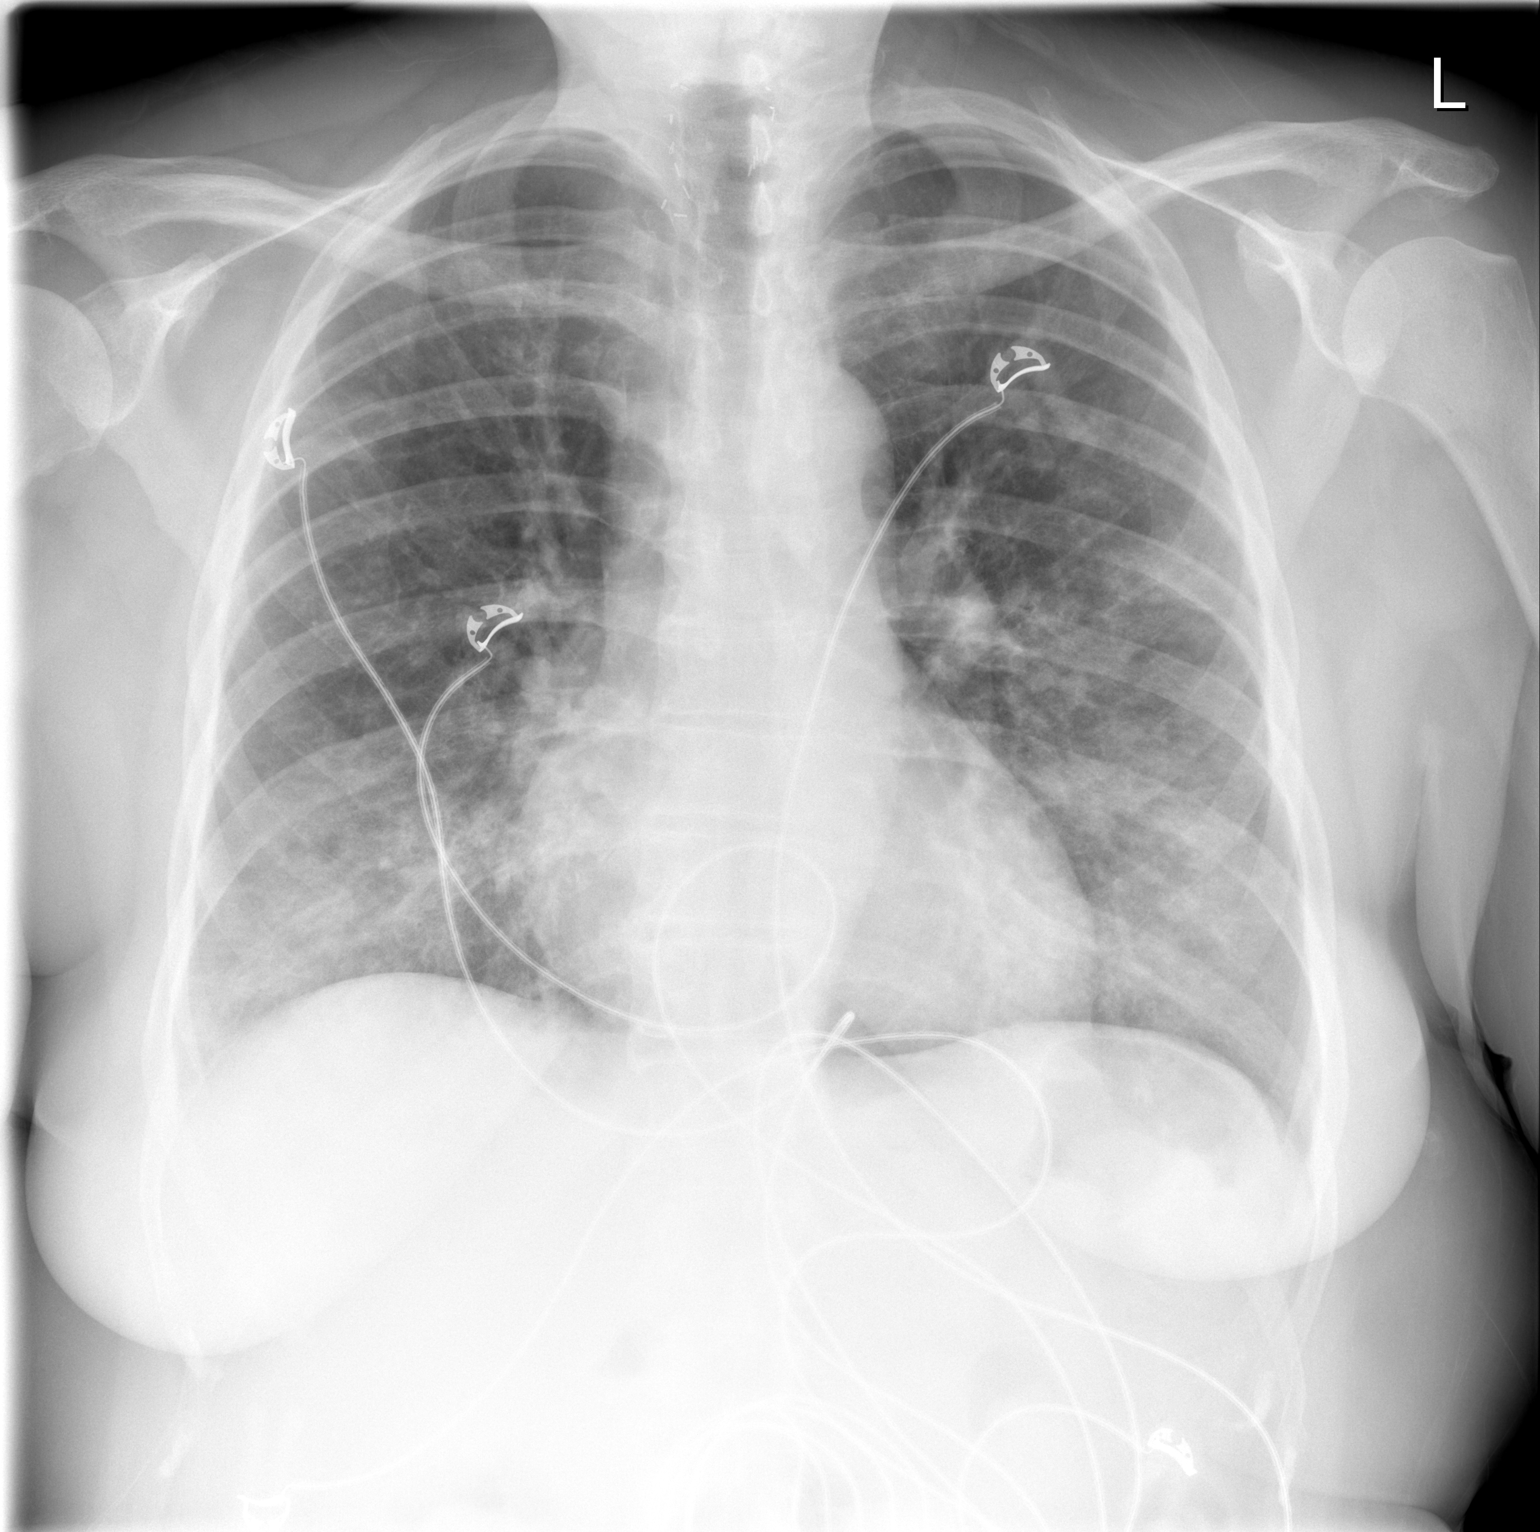

[w chest lat]
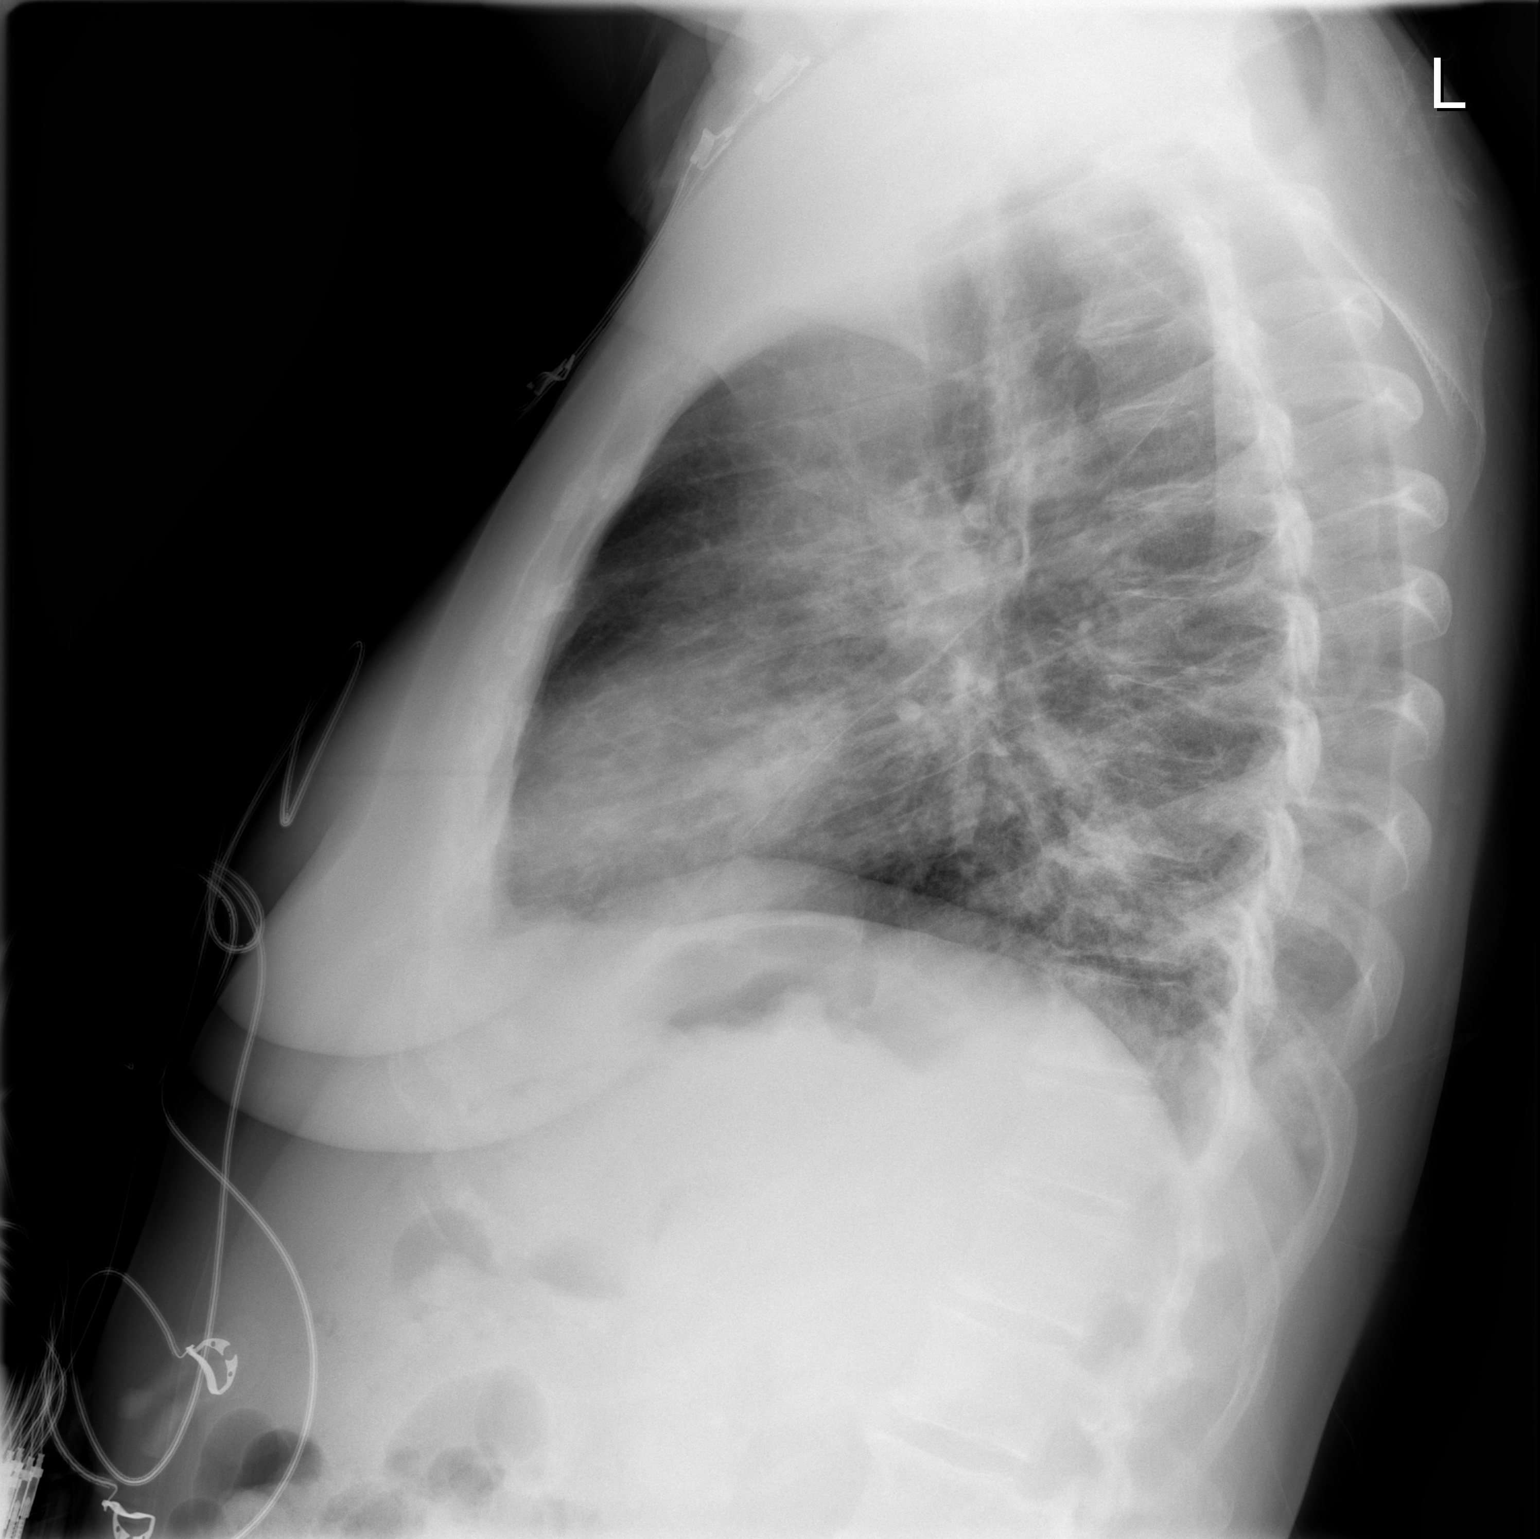

[2 of 2 positions shown; findings below may reference images not displayed]

FINDINGS: There has been minimal improvement in bibasilar airspace
opacities.  Cardiac leads overlie the chest.  Heart size is normal.
No significant pleural effusion.
IMPRESSION: Persistent bibasilar airspace opacities with minimal interval
improvement.

## 2010-05-05 IMAGING — CT CT CHEST W/O CM
2 of 3 series · 15 of 36 positions shown, 18 images · non-contrast
Comparison: Chest x-ray 10/01/2008.  Chest CT 09/13/2007

CLINICAL DATA: Cough, pneumonia.

CT CHEST WITHOUT CONTRAST
TECHNIQUE: Multidetector CT imaging of the chest was performed
following the standard protocol without IV contrast.

[Series 2: routine chest 5.0 st · axial · 0.65mm/px · z∈[-96,+184]mm · 12 of 66 slices shown, 15 images]
[im 5/66  mediastinal]
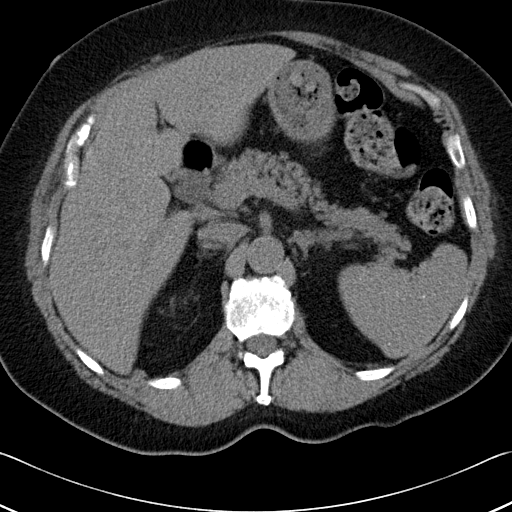
[im 5/66  lung]
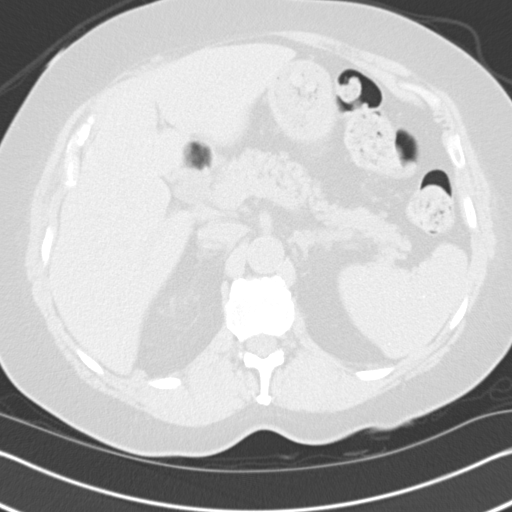
[im 10/66  lung]
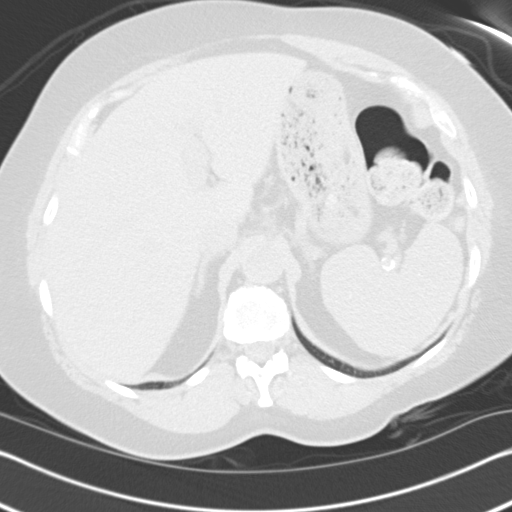
[im 15/66  lung]
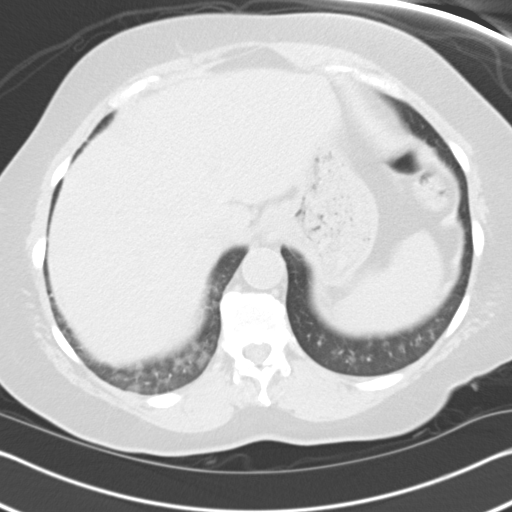
[im 20/66  lung]
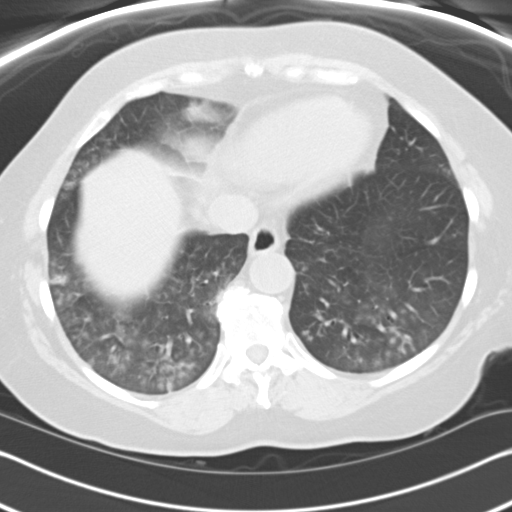
[im 25/66  mediastinal]
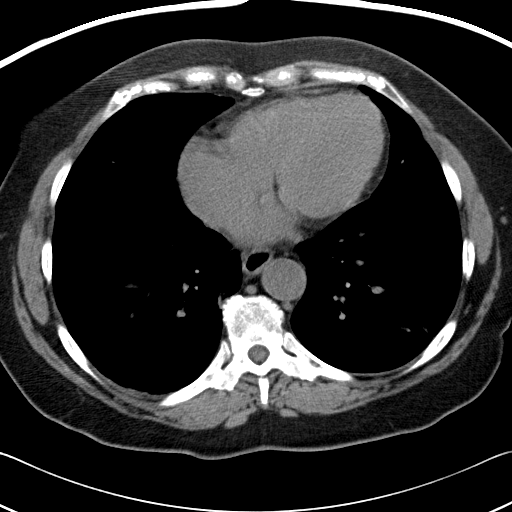
[im 25/66  lung]
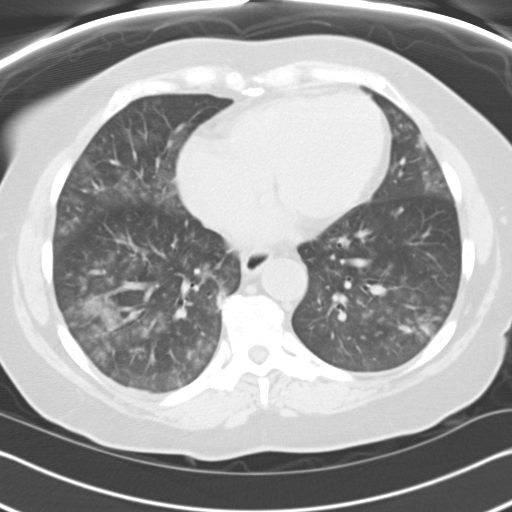
[im 29/66  lung]
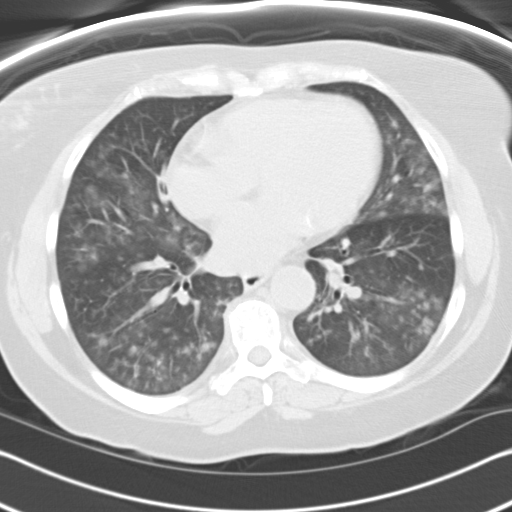
[im 37/66  lung]
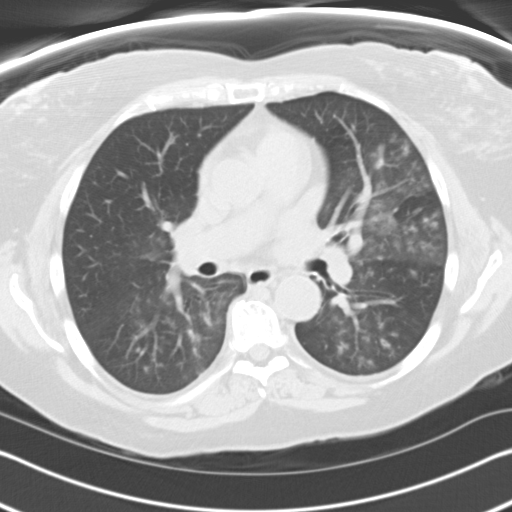
[im 41/66  lung]
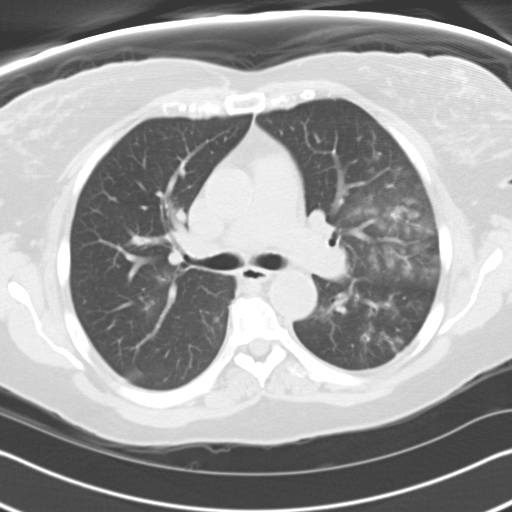
[im 46/66  mediastinal]
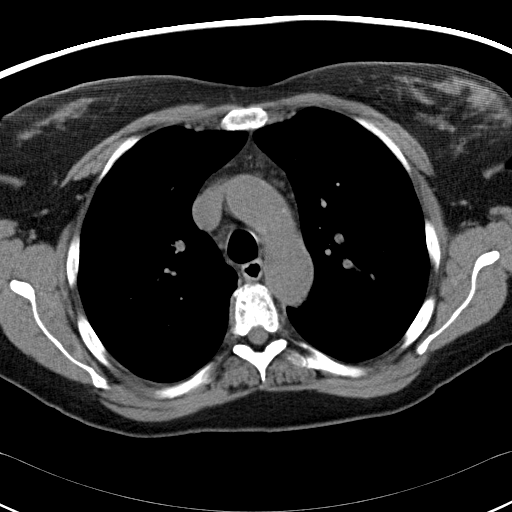
[im 46/66  lung]
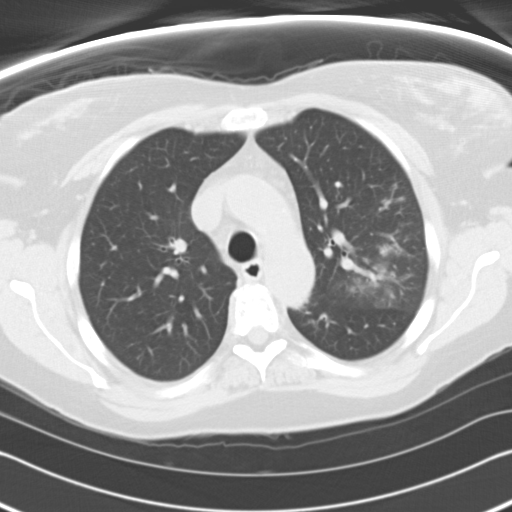
[im 51/66  lung]
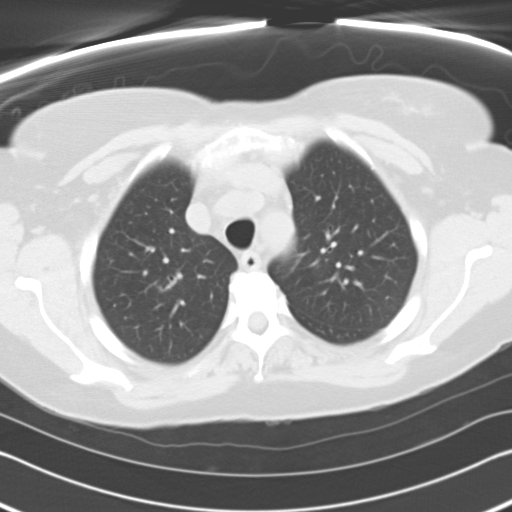
[im 56/66  lung]
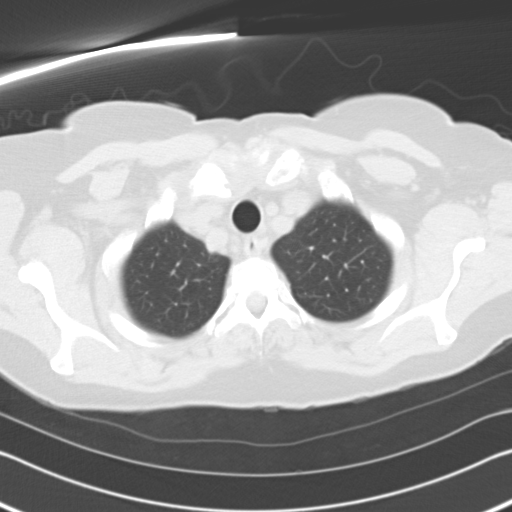
[im 61/66  lung]
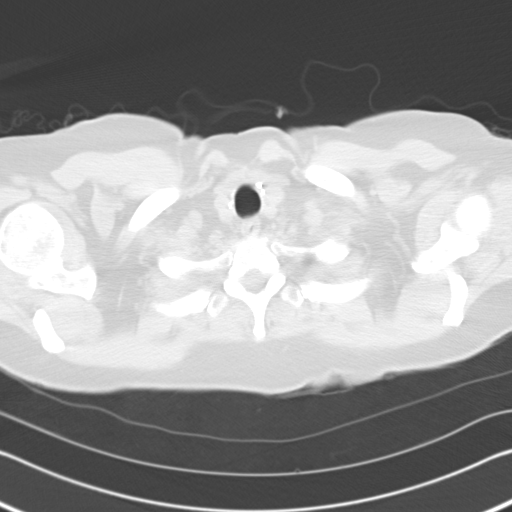

[Series 5: routine chest 2.0 st · coronal · 0.67mm/px · 3 of 122 slices shown]
[im 25/122  lung]
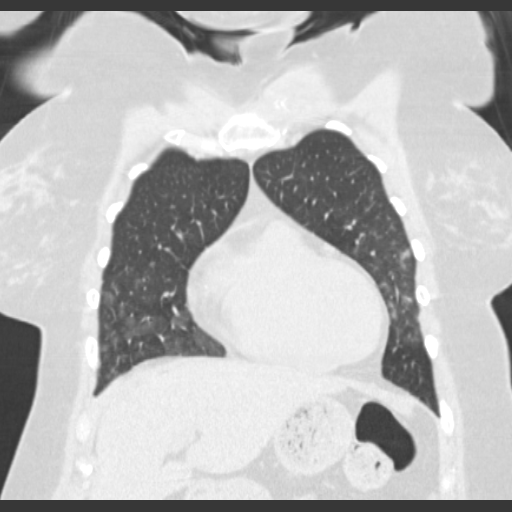
[im 49/122  lung]
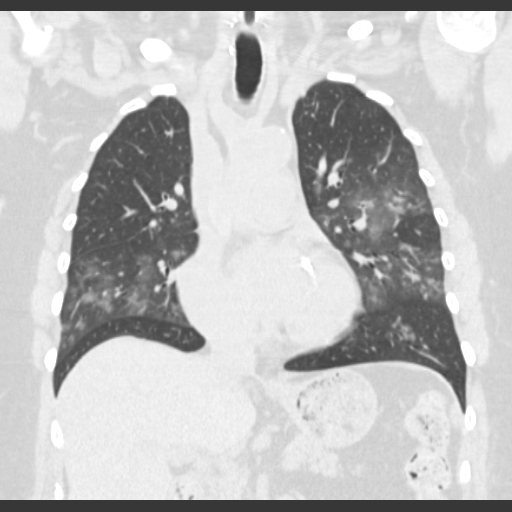
[im 73/122  lung]
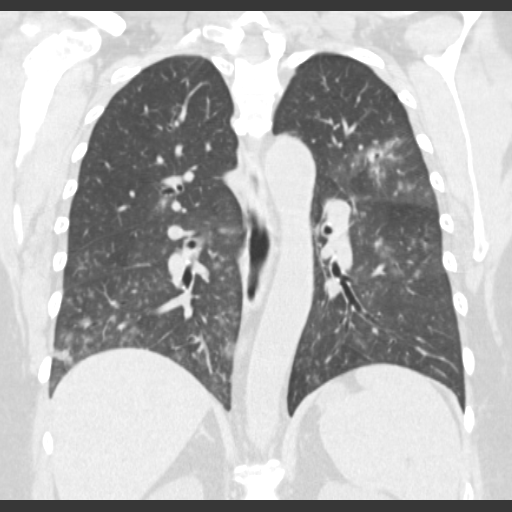

[15 of 36 positions shown; findings below may reference images not displayed]

FINDINGS: The dominant finding are numerous ground-glass nodular
densities in the peribronchovascular distribution, most pronounced
in the posterior left upper lobe, right middle lobe, and both lower
lobes, right greater than left.  Tree in bud densities are present
as well in these areas.  In these areas, there may be bronchiolar
wall thickening and possibly mild bronchiolectasis.

No pleural effusions.  Small scattered mediastinal and probable
hilar nodes, none pathologically enlarged.  No axillary adenopathy.

Heart is borderline in size.  Coronary artery calcifications
present.  Aorta is normal caliber.

Imaging into the upper abdomen shows calcifications in the spleen
and liver compatible with old granulomatous disease.  No acute
findings.

No acute bony abnormality.
IMPRESSION: Predominately ground-glass nodular densities in a
peribronchovascular distribution.  There is also bronchiolar wall
thickening and tree in bud densities.  Differential considerations
would include panbronchiolitis, bronchopneumonia, typical bacterial
pneumonia, or less likely hypersensitivity pneumonitis.

## 2010-05-06 IMAGING — CR DG CHEST 2V
2 series · 2 of 2 positions shown · non-contrast
Comparison: CT chest 10/02/2008 and PA and lateral chest
10/01/2008.

CLINICAL DATA: Chest pain, shortness of breath.

PA and lateral chest.

[w chest pa]
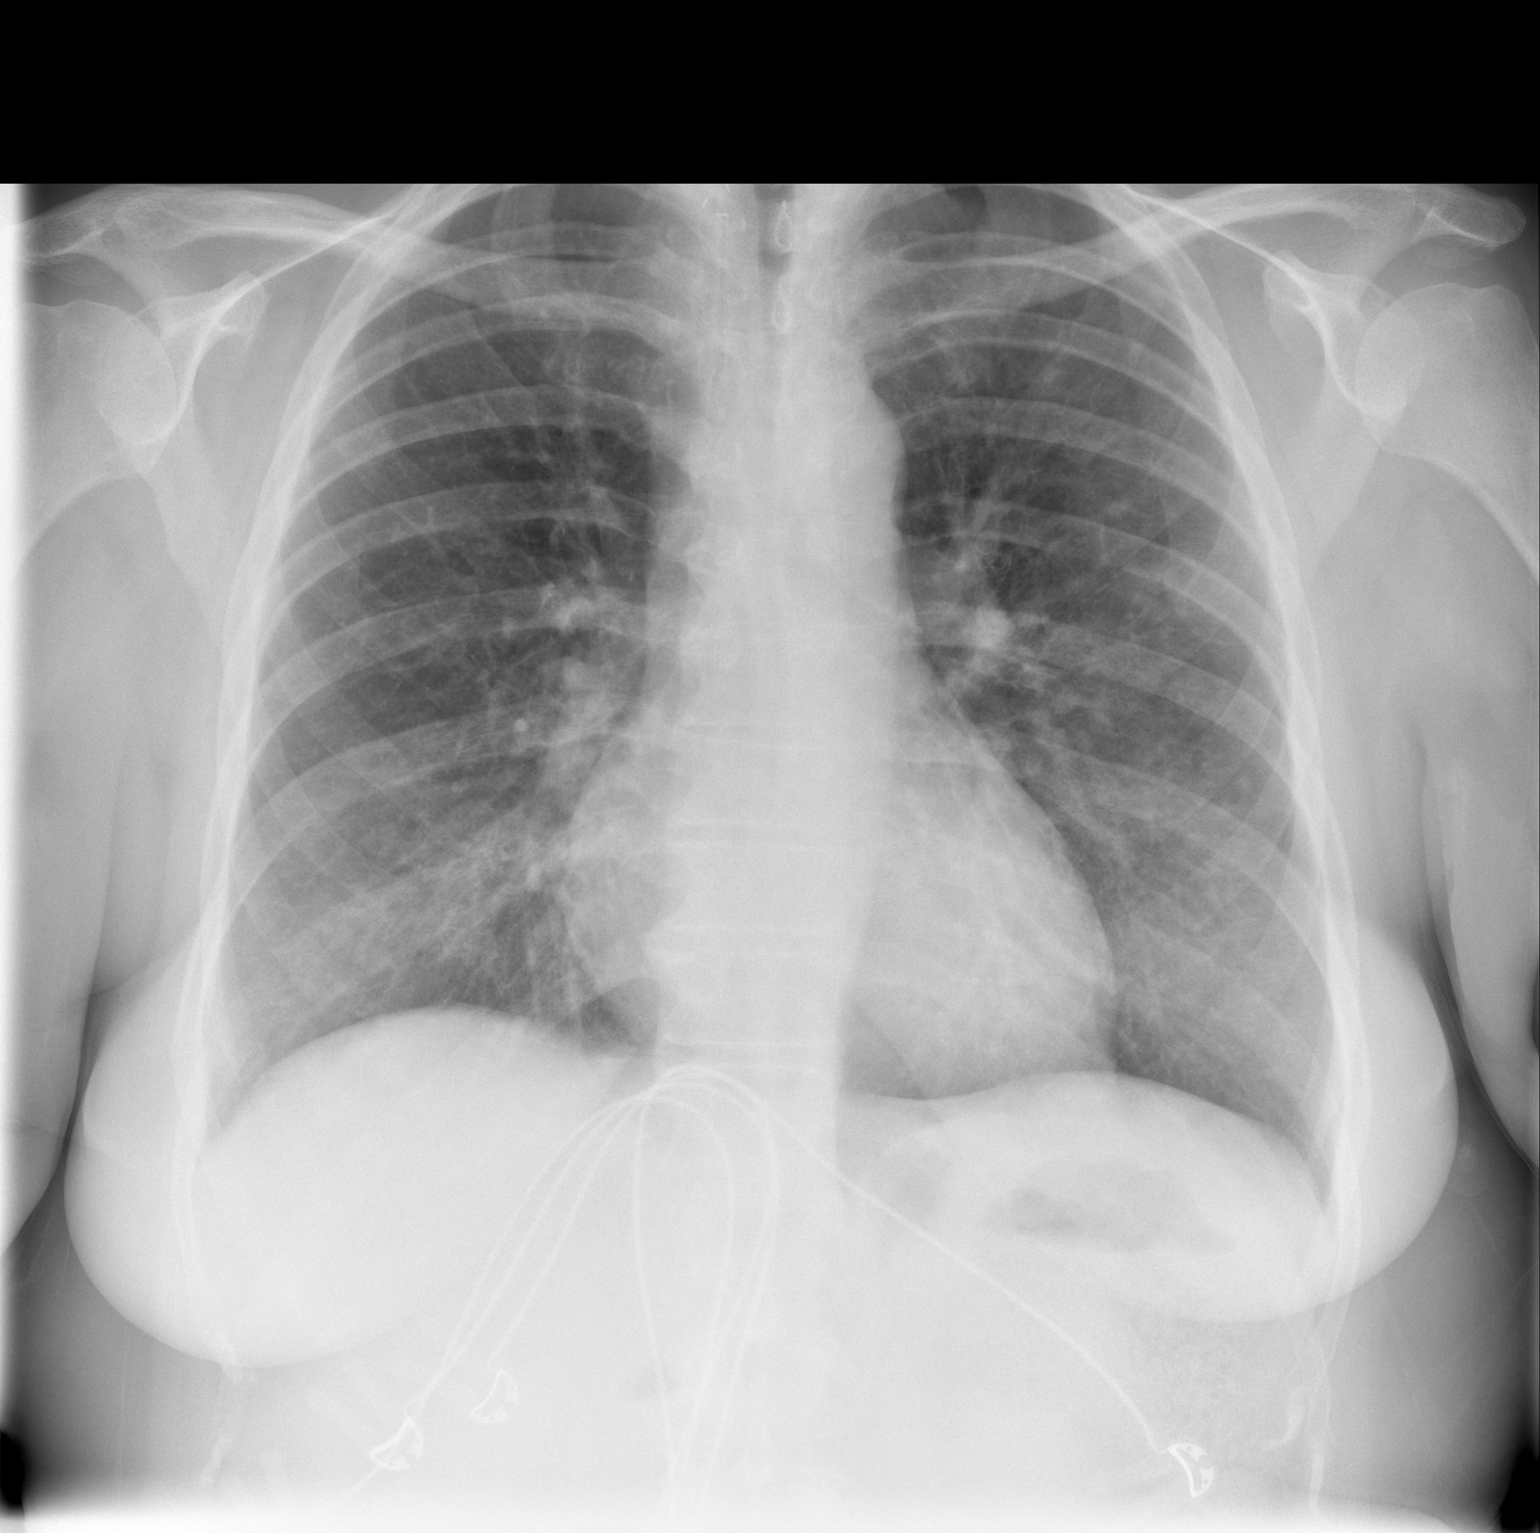

[w chest lat]
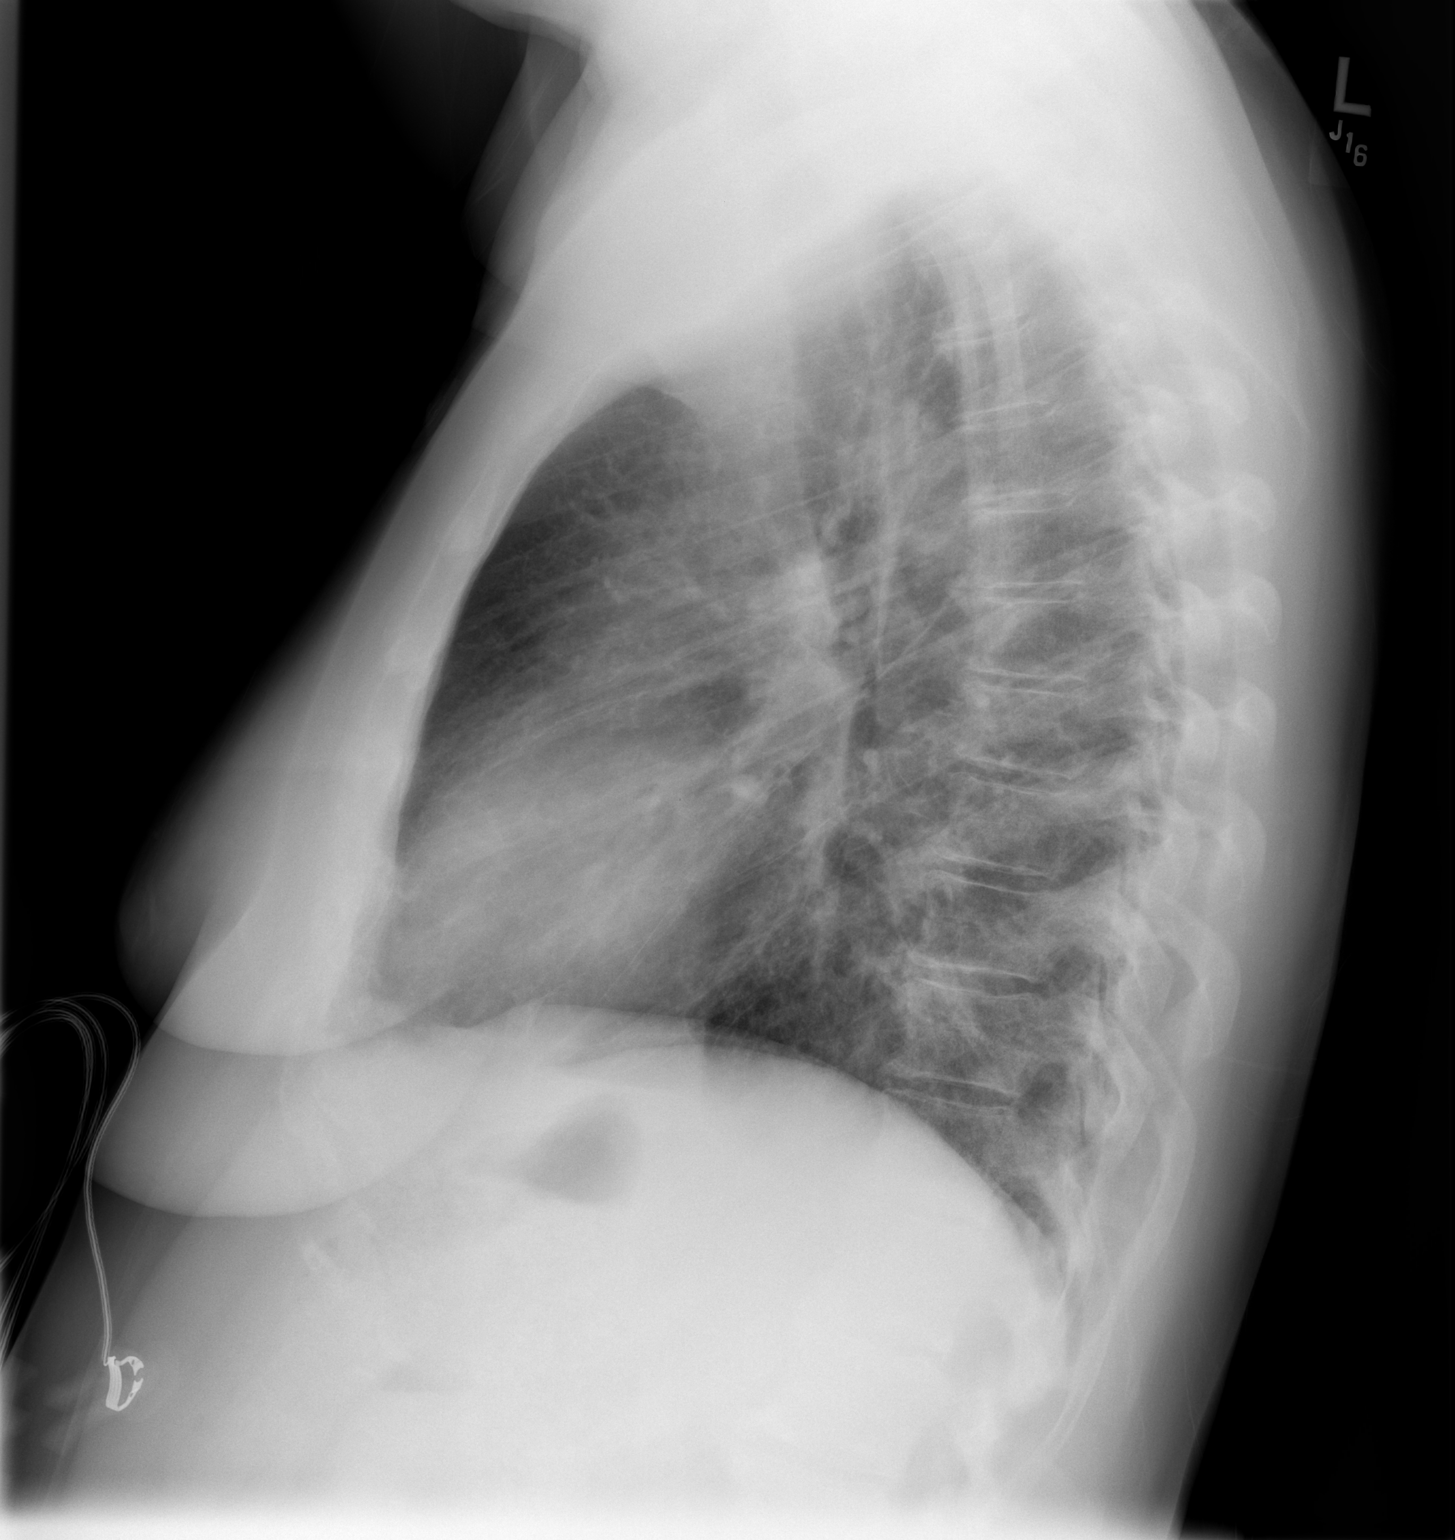

[2 of 2 positions shown; findings below may reference images not displayed]

FINDINGS: The patient's bilateral airspace disease shows
improvement over the past 2 days.  There are some persistent patchy
opacities best seen in the lung bases and left mid lung.  There is
no pleural effusion.  Heart size is normal.  No focal bony
abnormality.
IMPRESSION: Improved bilateral airspace disease consistent with resolving
pneumonia.

## 2010-05-14 IMAGING — CR DG CHEST 2V
2 series · 2 of 2 positions shown · non-contrast
Comparison: 10/03/2008 study

CLINICAL DATA: History given of coughing and shortness of breath.

CHEST - 2 VIEW

[view not recorded (1 of 2)]
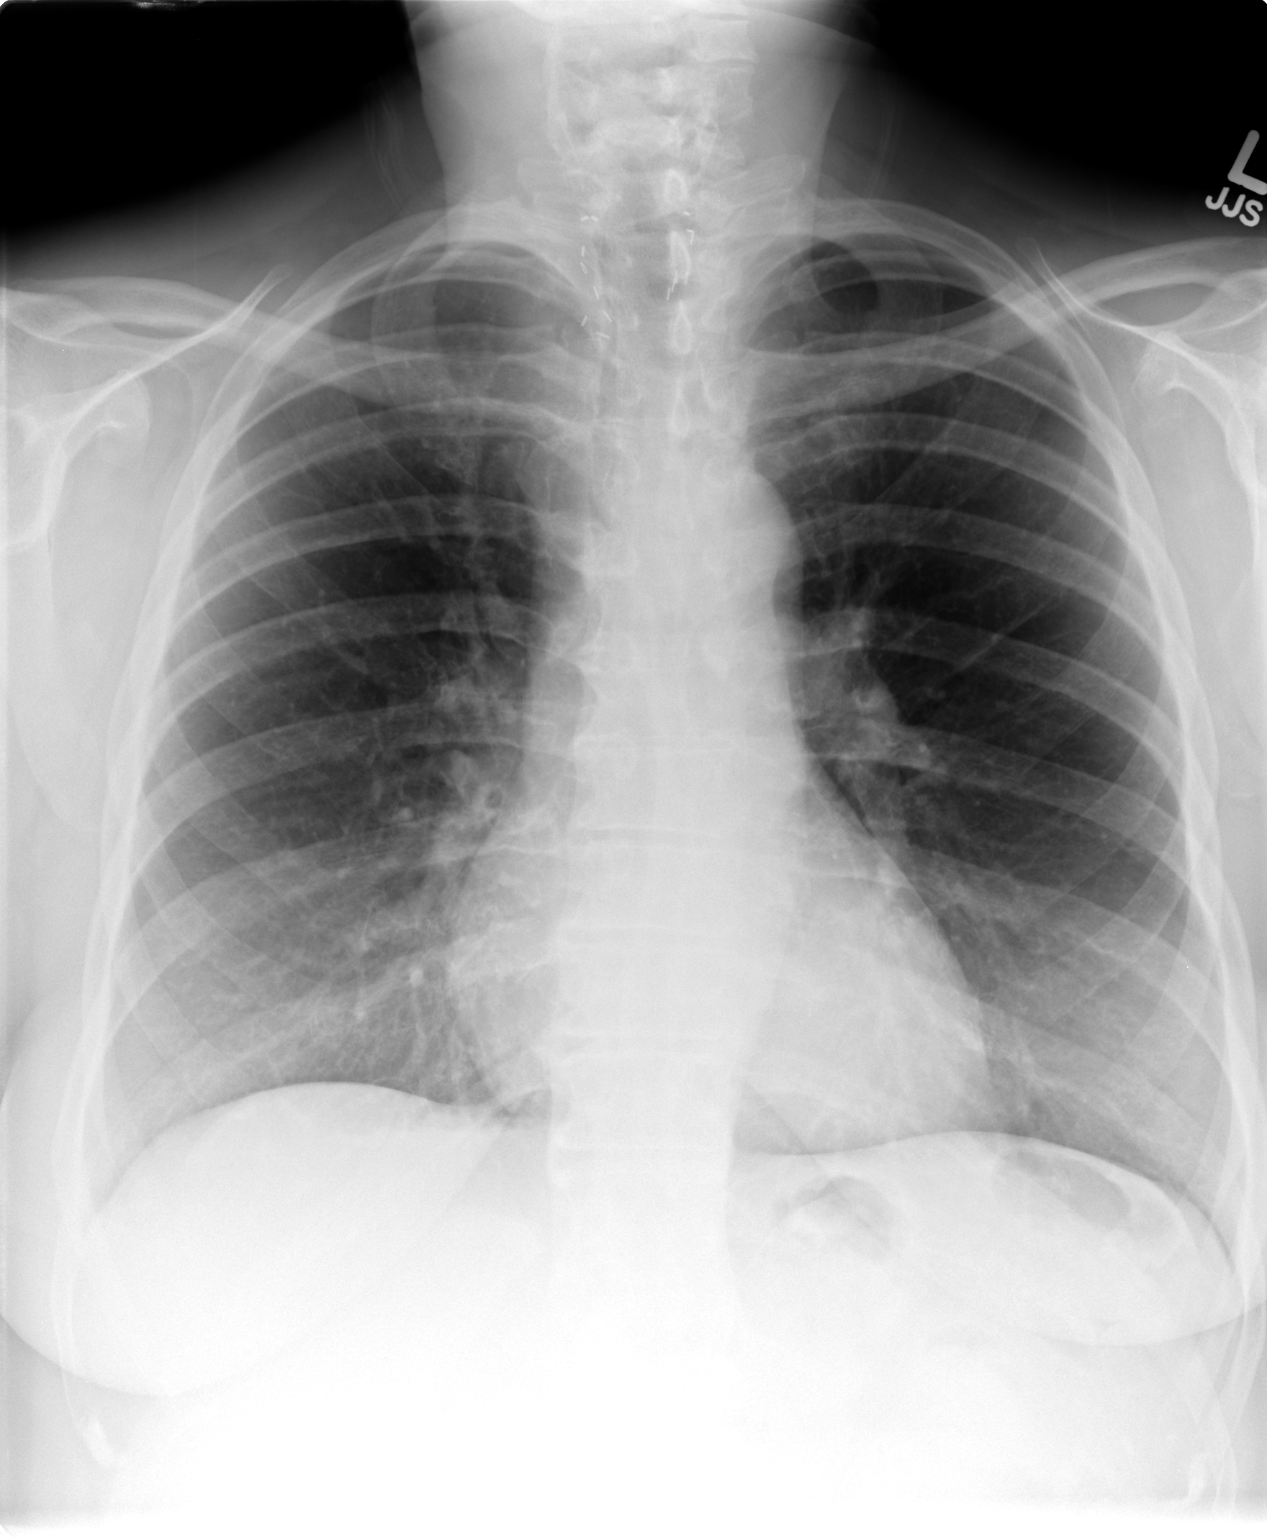

[view not recorded (2 of 2)]
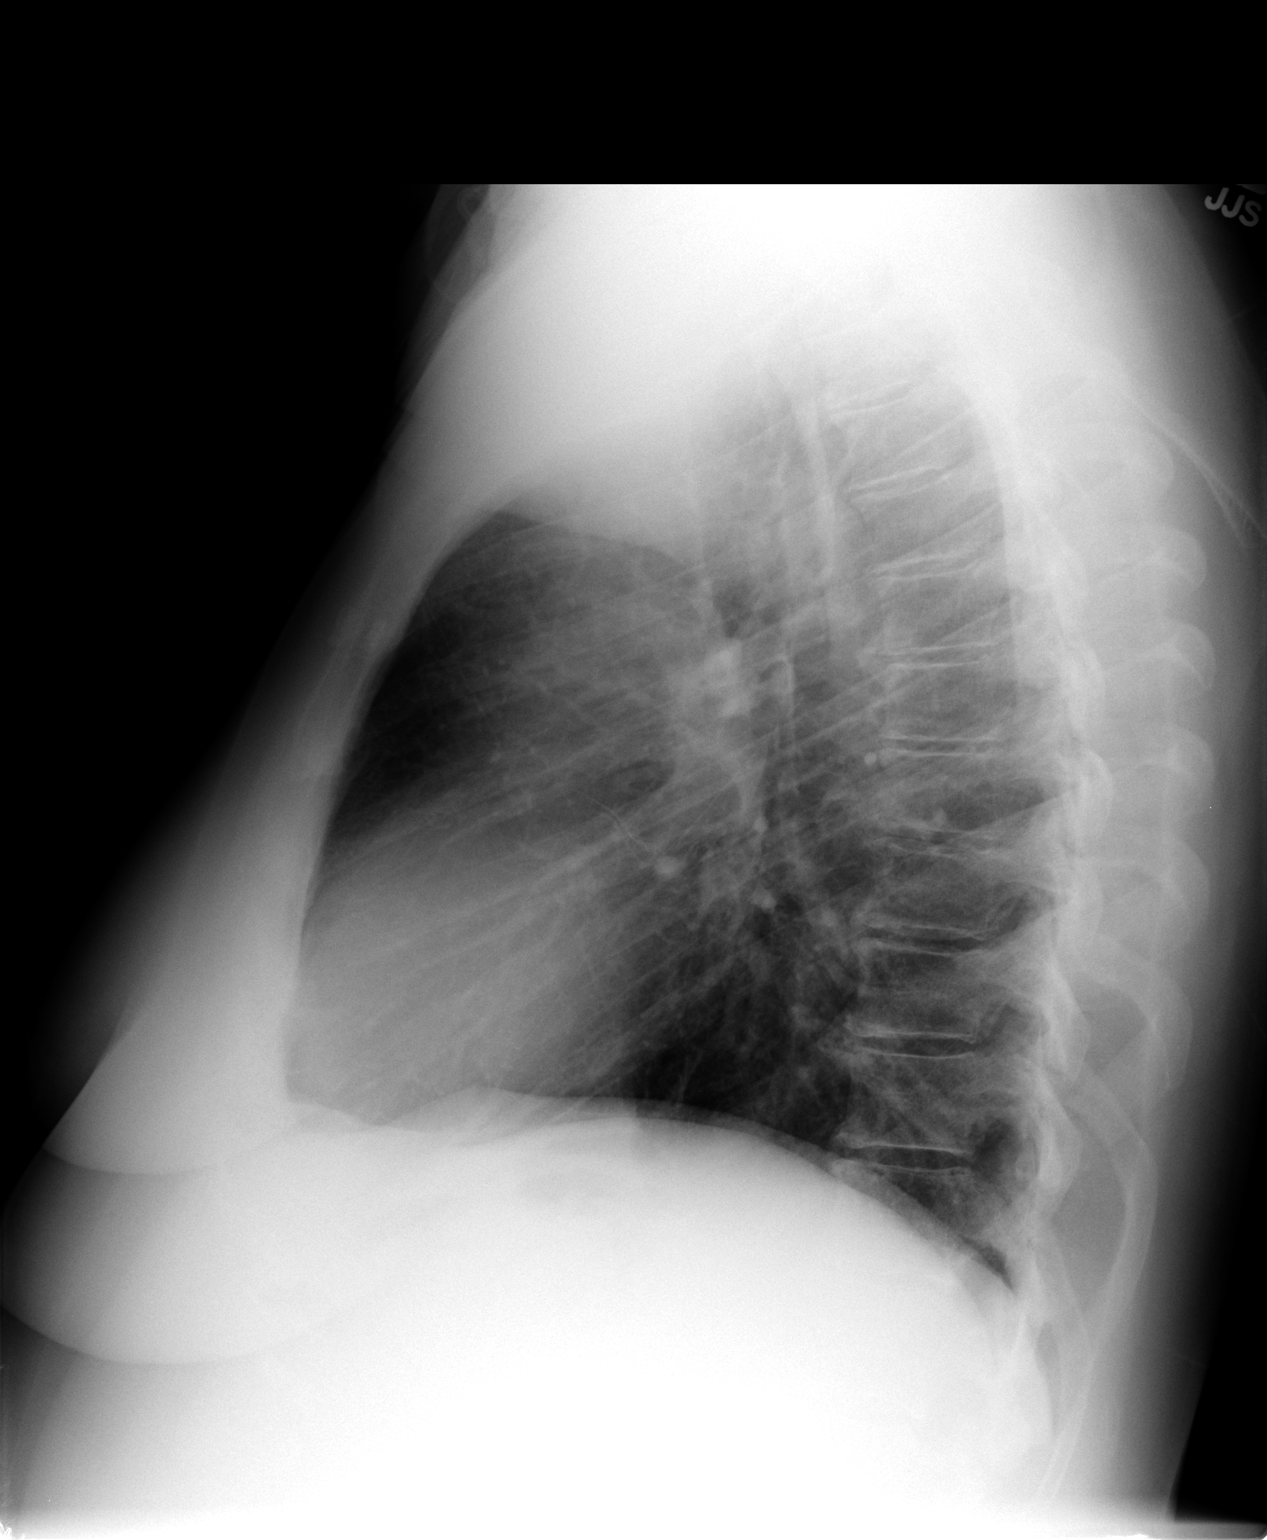

[2 of 2 positions shown; findings below may reference images not displayed]

FINDINGS: Cardiac silhouette is normal size shape.  No pleural
disease is seen.  Lungs are well aerated.  The patchy infiltrative
densities present in the left mid lung and lung bases appear to
have resolved.  Hazy opacity projecting over the lung bases on the
PA view is felt to be secondary to superimposed breast soft tissue.
On the lateral view no infiltrative densities are evident.
Osteophytes are present the spine.
IMPRESSION: Hazy opacity projecting over the lung bases on the PA
view is felt to be secondary to superimposed breast soft tissue.
On the lateral view no infiltrative densities are evident. No acute
process is identified.

## 2010-05-17 ENCOUNTER — Ambulatory Visit: Payer: Self-pay | Admitting: Internal Medicine

## 2010-05-22 ENCOUNTER — Ambulatory Visit: Payer: Self-pay | Admitting: Internal Medicine

## 2010-05-29 LAB — CONVERTED CEMR LAB
AST: 32 units/L (ref 0–37)
Alkaline Phosphatase: 78 units/L (ref 39–117)
Direct LDL: 163.4 mg/dL
Hgb A1c MFr Bld: 6 % (ref 4.6–6.5)
Total Bilirubin: 0.6 mg/dL (ref 0.3–1.2)
Total CHOL/HDL Ratio: 5
VLDL: 23.8 mg/dL (ref 0.0–40.0)

## 2010-05-31 ENCOUNTER — Ambulatory Visit: Payer: Self-pay | Admitting: Internal Medicine

## 2010-05-31 ENCOUNTER — Ambulatory Visit: Payer: Self-pay | Admitting: Oncology

## 2010-05-31 LAB — CONVERTED CEMR LAB: POC INR: 2.2

## 2010-06-04 ENCOUNTER — Encounter: Payer: Self-pay | Admitting: Internal Medicine

## 2010-06-04 LAB — COMPREHENSIVE METABOLIC PANEL
ALT: 34 U/L (ref 0–35)
CO2: 28 mEq/L (ref 19–32)
Creatinine, Ser: 1.12 mg/dL (ref 0.40–1.20)
Total Bilirubin: 0.5 mg/dL (ref 0.3–1.2)

## 2010-06-04 LAB — CBC WITH DIFFERENTIAL/PLATELET
BASO%: 0.8 % (ref 0.0–2.0)
EOS%: 2.2 % (ref 0.0–7.0)
HCT: 38.3 % (ref 34.8–46.6)
LYMPH%: 17.9 % (ref 14.0–49.7)
MCH: 29.4 pg (ref 25.1–34.0)
MCHC: 33.5 g/dL (ref 31.5–36.0)
NEUT%: 66.2 % (ref 38.4–76.8)
Platelets: 212 10*3/uL (ref 145–400)

## 2010-06-14 ENCOUNTER — Ambulatory Visit: Payer: Self-pay | Admitting: Internal Medicine

## 2010-06-18 ENCOUNTER — Ambulatory Visit (HOSPITAL_COMMUNITY)
Admission: RE | Admit: 2010-06-18 | Discharge: 2010-06-18 | Payer: Self-pay | Source: Home / Self Care | Admitting: Oncology

## 2010-06-26 ENCOUNTER — Ambulatory Visit: Payer: Self-pay | Admitting: Cardiology

## 2010-06-28 ENCOUNTER — Encounter
Admission: RE | Admit: 2010-06-28 | Discharge: 2010-09-26 | Payer: Self-pay | Source: Home / Self Care | Attending: Surgery | Admitting: Surgery

## 2010-07-03 ENCOUNTER — Telehealth: Payer: Self-pay | Admitting: Internal Medicine

## 2010-07-03 ENCOUNTER — Encounter: Payer: Self-pay | Admitting: Internal Medicine

## 2010-07-03 ENCOUNTER — Ambulatory Visit (HOSPITAL_COMMUNITY): Admission: RE | Admit: 2010-07-03 | Discharge: 2010-07-03 | Payer: Self-pay | Admitting: Internal Medicine

## 2010-07-03 ENCOUNTER — Ambulatory Visit: Payer: Self-pay

## 2010-07-03 ENCOUNTER — Ambulatory Visit: Payer: Self-pay | Admitting: Internal Medicine

## 2010-07-08 ENCOUNTER — Telehealth (INDEPENDENT_AMBULATORY_CARE_PROVIDER_SITE_OTHER): Payer: Self-pay | Admitting: *Deleted

## 2010-07-09 ENCOUNTER — Ambulatory Visit: Payer: Self-pay | Admitting: Internal Medicine

## 2010-07-10 ENCOUNTER — Inpatient Hospital Stay (HOSPITAL_COMMUNITY): Admission: RE | Admit: 2010-07-10 | Discharge: 2010-07-11 | Payer: Self-pay | Admitting: Surgery

## 2010-07-10 HISTORY — PX: LAPAROSCOPIC GASTRIC BANDING: SHX1100

## 2010-07-10 LAB — CONVERTED CEMR LAB
Basophils Absolute: 0 10*3/uL (ref 0.0–0.1)
Eosinophils Absolute: 0.1 10*3/uL (ref 0.0–0.7)
INR: 1.3 — ABNORMAL HIGH (ref 0.8–1.0)
Lymphocytes Relative: 13.1 % (ref 12.0–46.0)
MCHC: 33.7 g/dL (ref 30.0–36.0)
Neutro Abs: 4.2 10*3/uL (ref 1.4–7.7)
Neutrophils Relative %: 73.5 % (ref 43.0–77.0)
Platelets: 171 10*3/uL (ref 150.0–400.0)
RDW: 15.3 % — ABNORMAL HIGH (ref 11.5–14.6)

## 2010-07-13 ENCOUNTER — Ambulatory Visit: Payer: Self-pay | Admitting: Internal Medicine

## 2010-07-13 LAB — CONVERTED CEMR LAB: POC INR: 2

## 2010-07-16 ENCOUNTER — Encounter: Payer: Self-pay | Admitting: Internal Medicine

## 2010-07-18 ENCOUNTER — Encounter: Admission: RE | Admit: 2010-07-18 | Discharge: 2010-07-18 | Payer: Self-pay | Admitting: Internal Medicine

## 2010-07-19 ENCOUNTER — Ambulatory Visit: Payer: Self-pay | Admitting: Internal Medicine

## 2010-07-24 ENCOUNTER — Encounter
Admission: RE | Admit: 2010-07-24 | Discharge: 2010-09-27 | Payer: Self-pay | Source: Home / Self Care | Attending: Surgery | Admitting: Surgery

## 2010-07-26 ENCOUNTER — Ambulatory Visit: Payer: Self-pay | Admitting: Cardiology

## 2010-07-26 ENCOUNTER — Encounter: Payer: Self-pay | Admitting: Internal Medicine

## 2010-07-27 ENCOUNTER — Ambulatory Visit: Payer: Self-pay | Admitting: Cardiology

## 2010-07-27 ENCOUNTER — Ambulatory Visit: Payer: Self-pay | Admitting: Internal Medicine

## 2010-07-27 DIAGNOSIS — R0989 Other specified symptoms and signs involving the circulatory and respiratory systems: Secondary | ICD-10-CM

## 2010-07-27 DIAGNOSIS — R0609 Other forms of dyspnea: Secondary | ICD-10-CM | POA: Insufficient documentation

## 2010-07-27 DIAGNOSIS — R55 Syncope and collapse: Secondary | ICD-10-CM | POA: Insufficient documentation

## 2010-07-27 DIAGNOSIS — M546 Pain in thoracic spine: Secondary | ICD-10-CM | POA: Insufficient documentation

## 2010-07-30 ENCOUNTER — Telehealth (INDEPENDENT_AMBULATORY_CARE_PROVIDER_SITE_OTHER): Payer: Self-pay | Admitting: *Deleted

## 2010-07-30 LAB — CONVERTED CEMR LAB
CK-MB: 0.8 ng/mL (ref 0.3–4.0)
Total CK: 46 units/L (ref 7–177)
Troponin I: 0.01 ng/mL (ref <0.06)

## 2010-08-06 ENCOUNTER — Encounter: Payer: Self-pay | Admitting: Physician Assistant

## 2010-08-06 ENCOUNTER — Ambulatory Visit: Payer: Self-pay | Admitting: Internal Medicine

## 2010-08-06 DIAGNOSIS — I5032 Chronic diastolic (congestive) heart failure: Secondary | ICD-10-CM | POA: Insufficient documentation

## 2010-08-22 ENCOUNTER — Ambulatory Visit: Payer: Self-pay | Admitting: Internal Medicine

## 2010-08-22 DIAGNOSIS — L723 Sebaceous cyst: Secondary | ICD-10-CM | POA: Insufficient documentation

## 2010-08-24 ENCOUNTER — Encounter: Payer: Self-pay | Admitting: Internal Medicine

## 2010-08-30 ENCOUNTER — Ambulatory Visit: Payer: Self-pay

## 2010-08-30 ENCOUNTER — Ambulatory Visit: Payer: Self-pay | Admitting: Internal Medicine

## 2010-09-12 ENCOUNTER — Ambulatory Visit (HOSPITAL_COMMUNITY)
Admission: RE | Admit: 2010-09-12 | Discharge: 2010-09-12 | Payer: Self-pay | Source: Home / Self Care | Attending: Internal Medicine | Admitting: Internal Medicine

## 2010-09-18 ENCOUNTER — Telehealth (INDEPENDENT_AMBULATORY_CARE_PROVIDER_SITE_OTHER): Payer: Self-pay | Admitting: *Deleted

## 2010-09-20 ENCOUNTER — Ambulatory Visit
Admission: RE | Admit: 2010-09-20 | Discharge: 2010-09-20 | Payer: Self-pay | Source: Home / Self Care | Attending: Internal Medicine | Admitting: Internal Medicine

## 2010-09-20 ENCOUNTER — Encounter: Payer: Self-pay | Admitting: Internal Medicine

## 2010-09-20 ENCOUNTER — Ambulatory Visit: Payer: Self-pay | Admitting: Cardiology

## 2010-09-21 LAB — CONVERTED CEMR LAB
Creatinine, Ser: 1.1 mg/dL (ref 0.4–1.2)
Direct LDL: 148.1 mg/dL
Hgb A1c MFr Bld: 5.6 % (ref 4.6–6.5)

## 2010-09-27 ENCOUNTER — Telehealth: Payer: Self-pay | Admitting: Internal Medicine

## 2010-09-27 ENCOUNTER — Encounter: Admit: 2010-09-27 | Discharge: 2010-10-23 | Payer: Self-pay | Attending: Surgery | Admitting: Surgery

## 2010-10-03 ENCOUNTER — Telehealth: Payer: Self-pay | Admitting: Internal Medicine

## 2010-10-04 ENCOUNTER — Ambulatory Visit: Admission: RE | Admit: 2010-10-04 | Discharge: 2010-10-04 | Payer: Self-pay | Source: Home / Self Care

## 2010-10-04 LAB — CONVERTED CEMR LAB: POC INR: 2.1

## 2010-10-08 ENCOUNTER — Ambulatory Visit
Admission: RE | Admit: 2010-10-08 | Discharge: 2010-10-08 | Payer: Self-pay | Source: Home / Self Care | Attending: Internal Medicine | Admitting: Internal Medicine

## 2010-10-08 LAB — CONVERTED CEMR LAB: Cholesterol, target level: 200 mg/dL

## 2010-10-15 ENCOUNTER — Encounter: Payer: Self-pay | Admitting: Internal Medicine

## 2010-10-21 LAB — CONVERTED CEMR LAB: Pro B Natriuretic peptide (BNP): 110.2 pg/mL — ABNORMAL HIGH (ref 0.0–100.0)

## 2010-10-25 NOTE — Letter (Signed)
[facility]   Imported By: Lanelle Bal 02/05/2010 13:30:27  _____________________________________________________________________  External Attachment:    Type:   Image     Comment:   External Document

## 2010-10-25 NOTE — Letter (Signed)
[facility]   Imported By: Lanelle Bal 12/11/2009 11:50:13  _____________________________________________________________________  External Attachment:    Type:   Image     Comment:   External Document

## 2010-10-25 NOTE — Progress Notes (Signed)
Summary: paperwork  Phone Note Call from Patient Call back at Home Phone [phone]   Reason for Call: Talk to Nurse Summary of Call: checking on paperwork Initial call taken by: Migdalia Dk,  November 28, 2009 1:44 PM  Follow-up for Phone Call        PPW ready for p/u. Pt aware Follow-up by: Duncan Dull, RN, BSN,  November 28, 2009 3:10 PM

## 2010-10-25 NOTE — Medication Information (Signed)
Summary: rov/tm  Anticoagulant Therapy  Managed by: Bethena Midget, RN, BSN Referring MD: Sherryl Manges MD PCP: Marga Melnick MD Supervising MD: Clifton James MD, Cristal Deer Indication 1: Atrial Fibrillation (ICD-427.31) Lab Used: [facility] Site: [facility] INR POC 2.8 INR RANGE 2 - 3  Dietary changes: no    Health status changes: no    Bleeding/hemorrhagic complications: no    Recent/future hospitalizations: no    Any changes in medication regimen? yes       Details: Biotin daily   Recent/future dental: no  Any missed doses?: no       Is patient compliant with meds? yes       Current Medications (verified): 1)  [facility] Aspirin 81 Mg Tbec (Aspirin) .... Take 1 Tablet By Mouth Once A Day 2)  Amiodarone Hcl 200 Mg Tabs (Amiodarone Hcl) .... Take 1 Tablet Two Times A Day 3)  Lasix 40 Mg Tabs (Furosemide) .... One Tab By Mouth Daily As Directed 4)  Coumadin 5 Mg Tabs (Warfarin Sodium) .... Take As Directed By Coumadin Clinic. 5)  Levothyroxine Sodium 200 Mcg Tabs (Levothyroxine Sodium) .... Take 1 Tablet Daily 6)  Tylenol Pm Extra Strength 500-25 Mg Tabs (Diphenhydramine-Apap (Sleep)) .... As Needed 7)  Fish Oil 1000 Mg Caps (Omega-3 Fatty Acids) .... Take 1 Tab Once Daily 8)  Multivitamins  Tabs (Multiple Vitamin) .... Take 1 Tablet By Mouth Once A Day 9)  Calcium Carbonate-Vitamin D 600-400 Mg-Unit  Tabs (Calcium Carbonate-Vitamin D) .Marland Kitchen.. 1 By Mouth Once Daily 10)  Vitamin D3 2000 Unit Caps (Cholecalciferol) .... Take 1 Tab Once Daily 11)  Pepcid 40 Mg Tabs (Famotidine) .... As Needed 12)  Arimidex 1 Mg Tabs (Anastrozole) .... Take 1 Tab Daily 13)  Biotin 10 Mg Tabs (Biotin) .Marland Kitchen.. 1 By Mouth Once Daily 14)  Biotin 5 Mg Tabs (Biotin) .... Take 1 Tablet Daily  Allergies: 1)  ! * Dental Anesthesia With Epinephrine  Anticoagulation Management History:      The patient is taking warfarin and comes in today for a routine follow up visit.  Positive risk factors for bleeding  include an age of 22 years or older and history of CVA/TIA.  The bleeding index is 'intermediate risk'.  Positive CHADS2 values include History of CHF and Prior Stroke/CVA/TIA.  Negative CHADS2 values include Age > 54 years old.  The start date was 07/25/2003.  Her last INR was 2.7.  Anticoagulation responsible provider: Clifton James MD, Cristal Deer.  INR POC: 2.8.  Cuvette Lot#: [id number].  Exp: 06/2011.    Anticoagulation Management Assessment/Plan:      The patient's current anticoagulation dose is Coumadin 5 mg tabs: Take as directed by coumadin clinic..  The target INR is 2 - 3.  The next INR is due 05/31/2010.  Anticoagulation instructions were given to patient.  Results were reviewed/authorized by Bethena Midget, RN, BSN.  She was notified by Bethena Midget, RN, BSN.         Prior Anticoagulation Instructions: INR 2.4 Continue 2.5mg s daily except 5mg s on Mondays and Fridays. Recheck in 4 weeks.   Current Anticoagulation Instructions: INR 2.8 Continue 2.5mg s daily except 5mg s on Mondays and Fridays. Recheck in 4 weeks.

## 2010-10-25 NOTE — Progress Notes (Signed)
Summary: change med  Phone Note From Pharmacy   Caller: [phone] Summary of Call: z-pac has possible interactions w/ amiodarone and wanted to know what med can be change to ............Marland KitchenDoristine Devoid  October 10, 2009 11:33 AM   Follow-up for Phone Call        Please call pharmacy and change to Augmentin 500mg  by mouth two times a day x 7 days , #14. Also please call patient and let her know that her cxr is negative for pneumonia  Follow-up by: Lemont Fillers FNP,  October 10, 2009 12:30 PM  Additional Follow-up for Phone Call Additional follow up Details #1::        patient aware of results and spoke w/ Clydie Braun at [facility] to change prescription .........Marland KitchenDoristine Devoid  October 10, 2009 3:07 PM     New/Updated Medications: AUGMENTIN 500-125 MG TABS (AMOXICILLIN-POT CLAVULANATE) take one tablet  two times a day x7days Prescriptions: AUGMENTIN 500-125 MG TABS (AMOXICILLIN-POT CLAVULANATE) take one tablet  two times a day x7days  #14 x 0   Entered by:   Doristine Devoid   Authorized by:   Lemont Fillers FNP   Signed by:   Doristine Devoid on 10/10/2009   Method used:   Telephoned to ...       [id number] (retail)       [address]       [place], [place]  [address]       Ph: [phone] or [phone]       Fax: [phone]   RxID:   [id number]

## 2010-10-25 NOTE — Letter (Signed)
[facility]   Imported By: Lanelle Bal 11/11/2009 09:49:01  _____________________________________________________________________  External Attachment:    Type:   Image     Comment:   External Document

## 2010-10-25 NOTE — Letter (Signed)
[facility]   Imported By: Lanelle Bal 11/17/2009 13:44:52  _____________________________________________________________________  External Attachment:    Type:   Image     Comment:   External Document

## 2010-10-25 NOTE — Progress Notes (Signed)
Summary: Lab Results  Phone Note Outgoing Call Call back at [facility] Phone [phone]   Call placed by: Shonna Chock CMA,  July 30, 2010 9:22 AM Call placed to: Patient Details for Reason: Lab Results Summary of Call: D/W Patient:   his is the screening test for Pulmonary Emboli. Although it is positive, the CT Angiogram was thankfully negative. The symptoms may be related to suboptimal inflation of lung bases  (atelectasis)& intra-abdominal post op edema (swelling). Please blow up @ least 5 balloons  per day or use an Incentive Spirometer if that were  issued post op.  Levester Fresh CMA  July 30, 2010 4:57 PM

## 2010-10-25 NOTE — Letter (Signed)
Summary: [facility]   Imported By: Lanelle Bal 01/31/2010 08:39:34  _____________________________________________________________________  External Attachment:    Type:   Image     Comment:   External Document

## 2010-10-25 NOTE — Letter (Signed)
Summary: [facility]  [facility]   Imported By: Lanelle Bal 08/06/2010 12:53:18  _____________________________________________________________________  External Attachment:    Type:   Image     Comment:   External Document

## 2010-10-25 NOTE — Assessment & Plan Note (Signed)
Summary: CELLULITUS//KN   Vital Signs:  Patient profile:   66 year old female Weight:      263.4 pounds Temp:     98.2 degrees F oral Pulse rate:   64 / minute Resp:     15 per minute BP sitting:   130 / 78  (left arm) Cuff size:   large  Vitals Entered By: Shonna Chock CMA (May 31, 2010 12:03 PM) CC: ? Cellulitis and refill Lasix   Primary Care Provider:  Marga Melnick MD  CC:  ? Cellulitis and refill Lasix.  History of Present Illness: Onset as swelling , redness & increased heat 05/26/2010 R shin > L .She had hit  shins  & knees in fall on 05/23/2010 with some swelling immediately @ [facility]. She had been flying & had forgotten to take Lasix.  Current Medications (verified): 1)  [facility] Aspirin 81 Mg Tbec (Aspirin) .... Take 1 Tablet By Mouth Once A Day 2)  Amiodarone Hcl 200 Mg Tabs (Amiodarone Hcl) .... Take 1 Tablet Two Times A Day 3)  Lasix 40 Mg Tabs (Furosemide) .... One Tab By Mouth Daily As Directed 4)  Coumadin 5 Mg Tabs (Warfarin Sodium) .... Take As Directed By Coumadin Clinic. 5)  Levothyroxine Sodium 200 Mcg Tabs (Levothyroxine Sodium) .... Take 1 Tablet Daily 6)  Tylenol Pm Extra Strength 500-25 Mg Tabs (Diphenhydramine-Apap (Sleep)) .... As Needed 7)  Multivitamins  Tabs (Multiple Vitamin) .... Take 1 Tablet By Mouth Once A Day 8)  Calcium 500 Mg Tabs (Calcium) .Marland Kitchen.. 1 By Mouth Once Daily 9)  Vitamin D3 1000 Unit Tabs (Cholecalciferol) .Marland Kitchen.. 1 By Mouth Once Daily 10)  Arimidex 1 Mg Tabs (Anastrozole) .... Take 1 Tab Daily 11)  Biotin 5000 Mcg Caps (Biotin) .Marland Kitchen.. 1 By Mouth Once Daily  Allergies: 1)  ! * Dental Anesthesia With Epinephrine  Review of Systems General:  Denies chills, fever, and sweats. Resp:  Denies chest pain with inspiration, coughing up blood, and shortness of breath.  Physical Exam  General:  in no acute distress; alert,appropriate and cooperative throughout examination Lungs:  Normal respiratory effort, chest expands  symmetrically. Lungs are clear to auscultation, no crackles or wheezes. Heart:  regular rhythm, no gallop, no rub, no JVD, bradycardia, and grade 1 /6 systolic murmur.   Pulses:  R and L dorsalis pedis and posterior tibial pulses are full and equal bilaterally Extremities:  No clubbing or  cyanosis. 1/2+L & 1+ R ankle   edema. 3X3 inch mild erythema R shin. Very minimal erythema L shin . Negative Homan's . Skin:  See legs   Impression & Recommendations:  Problem # 1:  CELLULITIS (ICD-682.9)  Her updated medication list for this problem includes:    Cephalexin 500 Mg Caps (Cephalexin) .Marland Kitchen... 1 two times a day  Orders: Prescription Created Electronically [phone])  Complete Medication List: 1)  [facility] Aspirin 81 Mg Tbec (Aspirin) .... Take 1 tablet by mouth once a day 2)  Amiodarone Hcl 200 Mg Tabs (Amiodarone hcl) .... Take 1 tablet two times a day 3)  Lasix 40 Mg Tabs (Furosemide) .... One tab by mouth daily as directed 4)  Coumadin 5 Mg Tabs (Warfarin sodium) .... Take as directed by coumadin clinic. 5)  Levothyroxine Sodium 200 Mcg Tabs (Levothyroxine sodium) .... Take 1 tablet daily 6)  Tylenol Pm Extra Strength 500-25 Mg Tabs (Diphenhydramine-apap (sleep)) .... As needed 7)  Multivitamins Tabs (Multiple vitamin) .... Take 1 tablet by mouth once a day  8)  Calcium 500 Mg Tabs (Calcium) .Marland Kitchen.. 1 by mouth once daily 9)  Vitamin D3 1000 Unit Tabs (Cholecalciferol) .Marland Kitchen.. 1 by mouth once daily 10)  Arimidex 1 Mg Tabs (Anastrozole) .... Take 1 tab daily 11)  Biotin 5000 Mcg Caps (Biotin) .Marland Kitchen.. 1 by mouth once daily 12)  Cephalexin 500 Mg Caps (Cephalexin) .Marland Kitchen.. 1 two times a day  Patient Instructions: 1)  Wet to dry saline soaks 4X/ day as discussed. Prescriptions: CEPHALEXIN 500 MG CAPS (CEPHALEXIN) 1 two times a day  #14 x 0   Entered and Authorized by:   Marga Melnick MD   Signed by:   Marga Melnick MD on 05/31/2010   Method used:   Faxed to ...       [facility]  (retail)       [address]       [place], [place]  [address]       Ph: [phone] or [phone]       Fax: [phone]   RxID:   [id number]

## 2010-10-25 NOTE — Progress Notes (Signed)
Summary: **NEED PPW COMPLETED BY [facility]** Bariatric Surgery, letter from [facility]  Phone Note Call from Patient Call back at Home Phone [phone]   Caller: Patient Reason for Call: Talk to Nurse Details for Reason: Per pt calling, aware that melaine is out of office today,  how should she approach dr. Graciela Husbands in regards to bariatric surgery. letter from [facility] also form that need to be filled out.  Initial call taken by: Lorne Skeens,  November 09, 2009 3:39 PM  Follow-up for Phone Call        The pt is currently doing pre-procedure paperwork for Bariatric surgery.  This surgery is not scheduled at this time.  The pt does need a letter from Dr Graciela Husbands about her comorbidities and her weight over the past five years.  The pt also has a form that Dr Graciela Husbands needs to complete.  The pt will bring these forms into the office on 11/10/09 for Melanie to review with Dr Graciela Husbands.  The pt's chart has also been requested from Medical Records for the physician to use when completing form.  The pt does need this form and letter completed ASAP. Follow-up by: Julieta Gutting, RN, BSN,  November 09, 2009 3:57 PM

## 2010-10-25 NOTE — Letter (Signed)
Summary: [facility]   Imported By: Lanelle Bal 10/10/2009 11:57:58  _____________________________________________________________________  External Attachment:    Type:   Image     Comment:   External Document

## 2010-10-25 NOTE — Letter (Signed)
[facility]   Imported By: Marylou Mccoy 11/06/2009 12:56:43  _____________________________________________________________________  External Attachment:    Type:   Image     Comment:   External Document

## 2010-10-25 NOTE — Progress Notes (Signed)
Summary: Crestor  Phone Note Refill Request Message from:  Fax from Pharmacy on April 25, 2010 4:41 PM  Refills Requested: Medication #1:  Crestor 20mg    [facility] I spoke with patient and she states that she takes one pill per week (Wed.) but would like to come off of this med. Patient is wondering if she can stop the med and do a cholesterol check. Please advise.  Initial call taken by: Lucious Groves CMA,  April 25, 2010 4:41 PM  Follow-up for Phone Call        Per MD it is ok to d/c, recheck NMR Lipoprofile 3-4 months after bariatric surgery.  Patient notified. Follow-up by: Lucious Groves CMA,  April 26, 2010 8:13 AM

## 2010-10-25 NOTE — Assessment & Plan Note (Signed)
Summary: COUGH X3WKS/RH.....   Vital Signs:  Patient profile:   65 year old female Weight:      268.4 pounds O2 Sat:      92 % on Room air Temp:     97.5 degrees F oral Pulse rate:   80 / minute BP sitting:   130 / 70  (left arm)  Vitals Entered By: Doristine Devoid (October 10, 2009 8:52 AM)  O2 Flow:  Room air CC: cough x3 wks somewhat productive   Primary Care Provider:  Marga Melnick MD  CC:  cough x3 wks somewhat productive.  History of Present Illness: [name] is a 65 year old female who presents with c/o cough x 3 weeks.  Symptoms started with a cold.  She continues to have nasal drainage which is generally clear- occasional yellow mucous.  Denies fever.  Notes that her cough is very productive of clear sputum.  Patient has used mucinex without improvment.    Patient was diagnosed with breast cancer in November 2010.  She starts radiation today.   Allergies: 1)  ! * Dental Anesthesia With Epinephrine  Review of Systems       Denies sinus pressure, + pain in right ear when blowing nose.    Physical Exam  General:  Well-developed,well-nourished,in no acute distress; alert,appropriate and cooperative throughout examination Ears:  + cerumen occluding right ear Mouth:  Oral mucosa and oropharynx without lesions or exudates.  Teeth in good repair. Neck:  No deformities, masses, or tenderness noted. Lungs:  Normal respiratory effort, chest expands symmetrically. Lungs are clear to auscultation, no crackles or wheezes. + dry hacking cough Heart:  Normal rate and regular rhythm. S1 and S2 normal without gallop, murmur, click, rub or other extra sounds.   Impression & Recommendations:  Problem # 1:  ACUTE BRONCHITIS (ICD-466.0) Assessment New Will plan to treat empirically with zithromax and cough meds.  Will check a chest x-ray to r/o underlying pneumonia given duration of symptoms.  Her updated medication list for this problem includes:    Zithromax Z-pak 250 Mg  Tabs (Azithromycin) .Marland Kitchen..Marland Kitchen Two tablets by mouth x 1 today, then one tablet by mouth daily x 4 more days.    Tussionex Pennkinetic Er 8-10 Mg/64ml Lqcr (Chlorpheniramine-hydrocodone) ..... One teaspoon at bed time as needed for cough    Tessalon Perles 100 Mg Caps (Benzonatate) ..... One cap by mouth every 8 hours as needed during the day  Orders: T-2 View CXR (71020TC)  Complete Medication List: 1)  [facility] Aspirin 81 Mg Tbec (Aspirin) .... Take 1 tablet by mouth once a day 2)  Amiodarone Hcl 200 Mg Tabs (Amiodarone hcl) .... Take 1 tablet two times a day 3)  Lasix 40 Mg Tabs (Furosemide) .... One tab by mouth dailey,as directed 4)  Coumadin 5 Mg Tabs (Warfarin sodium) .... Take as directed by coumadin clinic. 5)  Levothyroxine Sodium 200 Mcg Tabs (Levothyroxine sodium) .... Take 1 tablet daily 6)  Tylenol Pm Extra Strength 500-25 Mg Tabs (Diphenhydramine-apap (sleep)) .... As needed 7)  Fish Oil 1200 Mg Caps (Omega-3 fatty acids) .... Take 1 capsule by mouth once a day 8)  Multivitamins Tabs (Multiple vitamin) .... Take 1 tablet by mouth once a day 9)  Calcium Carbonate-vitamin D 600-400 Mg-unit Tabs (Calcium carbonate-vitamin d) .... When remembers 10)  Vitamin D3 2000 Unit Caps (Cholecalciferol) .... Take 1 tab once daily 11)  Coumadin 2.5 Mg Tabs (Warfarin sodium) .... Take one daily 12)  Zithromax Z-pak  250 Mg Tabs (Azithromycin) .... Two tablets by mouth x 1 today, then one tablet by mouth daily x 4 more days. 13)  Tussionex Pennkinetic Er 8-10 Mg/22ml Lqcr (Chlorpheniramine-hydrocodone) .... One teaspoon at bed time as needed for cough 14)  Tessalon Perles 100 Mg Caps (Benzonatate) .... One cap by mouth every 8 hours as needed during the day  Patient Instructions: 1)  Please complete your chest x-ray today. 2)  Start your antibiotics today. 3)  Do not take tessalon and tussionex together. 4)  Call if your symptoms worsen or do not improve or if fever over 101. 5)  Follow up in 1  week. Prescriptions: TESSALON PERLES 100 MG CAPS (BENZONATATE) one cap by mouth every 8 hours as needed during the day  #30 x 0   Entered and Authorized by:   Lemont Fillers FNP   Signed by:   Lemont Fillers FNP on 10/10/2009   Method used:   Print then Give to Patient   RxID:   [id number] TUSSIONEX PENNKINETIC ER 8-10 MG/5ML LQCR (CHLORPHENIRAMINE-HYDROCODONE) one teaspoon at bed time as needed for cough  #120 x 0   Entered and Authorized by:   Lemont Fillers FNP   Signed by:   Lemont Fillers FNP on 10/10/2009   Method used:   Print then Give to Patient   RxID:   [id number] ZITHROMAX Z-PAK 250 MG TABS (AZITHROMYCIN) two tablets by mouth x 1 today, then one tablet by mouth daily x 4 more days.  #1 pack x 0   Entered and Authorized by:   Lemont Fillers FNP   Signed by:   Lemont Fillers FNP on 10/10/2009   Method used:   Electronically to        [facility]       [address]       [place], [place]  [address]       Ph: [phone] or [phone]       Fax: [phone]   RxID:   [id number]

## 2010-10-25 NOTE — Medication Information (Signed)
Summary: ccr/saf  Anticoagulant Therapy  Managed by: Cloyde Reams, RN, BSN Referring MD: Sherryl Manges MD PCP: Marga Melnick MD Supervising MD: Jens Som MD, Arlys John Indication 1: Atrial Fibrillation (ICD-427.31) Lab Used: [facility] Site: [facility] INR POC 1.5 INR RANGE 2 - 3  Dietary changes: yes       Details: May have incr salad intake.   Health status changes: no    Bleeding/hemorrhagic complications: no    Recent/future hospitalizations: no    Any changes in medication regimen? no    Recent/future dental: no  Any missed doses?: yes     Details: Missed 2.5mg  several weeks ago.   Is patient compliant with meds? yes      Comments: Pt has been taking 1/2 tablet daily.    Allergies: 1)  ! * Dental Anesthesia With Epinephrine  Anticoagulation Management History:      The patient is taking warfarin and comes in today for a routine follow up visit.  Positive risk factors for bleeding include an age of 67 years or older and history of CVA/TIA.  The bleeding index is 'intermediate risk'.  Positive CHADS2 values include History of CHF and Prior Stroke/CVA/TIA.  Negative CHADS2 values include Age > 67 years old.  The start date was 07/25/2003.  Her last INR was 1.3 ratio.  Anticoagulation responsible provider: Jens Som MD, Arlys John.  INR POC: 1.5.  Cuvette Lot#: [id number].  Exp: 08/2011.    Anticoagulation Management Assessment/Plan:      The patient's current anticoagulation dose is Coumadin 5 mg tabs: Take as directed by coumadin clinic..  The target INR is 2 - 3.  The next INR is due 10/04/2010.  Anticoagulation instructions were given to patient.  Results were reviewed/authorized by Cloyde Reams, RN, BSN.  She was notified by Cloyde Reams RN.         Prior Anticoagulation Instructions: INR 2.0  Increase dose to 1/2 tablet every day except 1 tablet on Thursday.  Recheck INR in 3 weeks.   Current Anticoagulation Instructions: INR 1.5  Take 1 tablet today and tomorrow, then  start taking 1/2 tablet daily except 1 tablet on Thursdays.  Recheck in 2 weeks.

## 2010-10-25 NOTE — Miscellaneous (Signed)
Summary: Orders Update  Clinical Lists Changes  Orders: Added new Service order of Prescription Created Electronically ([id number]) - Signed 

## 2010-10-25 NOTE — Medication Information (Signed)
Summary: rov/cb  Anticoagulant Therapy  Managed by: Bethena Midget, RN, BSN Referring MD: Sherryl Manges MD PCP: Marga Melnick MD Supervising MD: Ladona Ridgel MD, Sharlot Gowda Indication 1: Atrial Fibrillation (ICD-427.31) Lab Used: [facility] Site: [facility] INR POC 2.4 INR RANGE 2 - 3  Dietary changes: yes       Details: Eating alittle extra salads  Health status changes: yes       Details: Occ. edema of Bil. feet/ankles  Bleeding/hemorrhagic complications: no    Recent/future hospitalizations: no    Any changes in medication regimen? no    Recent/future dental: no  Any missed doses?: yes     Details: Approx 2 wks ago missed 2.5mg s dose  Is patient compliant with meds? yes       Allergies: 1)  ! * Dental Anesthesia With Epinephrine  Anticoagulation Management History:      The patient is taking warfarin and comes in today for a routine follow up visit.  Positive risk factors for bleeding include an age of 67 years or older and history of CVA/TIA.  The bleeding index is 'intermediate risk'.  Positive CHADS2 values include History of CHF and Prior Stroke/CVA/TIA.  Negative CHADS2 values include Age > 36 years old.  The start date was 07/25/2003.  Her last INR was 2.7.  Anticoagulation responsible provider: Ladona Ridgel MD, Sharlot Gowda.  INR POC: 2.4.  Cuvette Lot#: [id number].  Exp: 05/2011.    Anticoagulation Management Assessment/Plan:      The patient's current anticoagulation dose is Coumadin 5 mg tabs: Take as directed by coumadin clinic..  The target INR is 2 - 3.  The next INR is due 04/26/2010.  Anticoagulation instructions were given to patient.  Results were reviewed/authorized by Bethena Midget, RN, BSN.  She was notified by Bethena Midget, RN, BSN.         Prior Anticoagulation Instructions: INR 2.4. Take 0.5 tablet daily except 1 tablet on Mon and Fri.  Recheck in 4 weeks.  Current Anticoagulation Instructions: INR 2.4 Continue 2.5mg s daily except 5mg s on Mondays and Fridays. Recheck in 4  weeks.

## 2010-10-25 NOTE — Medication Information (Signed)
Summary: rov/sp  Anticoagulant Therapy  Managed by: Weston Brass, PharmD Referring MD: Sherryl Manges MD PCP: Marga Melnick MD Supervising MD: Graciela Husbands MD, Viviann Spare Indication 1: Atrial Fibrillation (ICD-427.31) Lab Used: [facility] Site: [facility] INR POC 2.0 INR RANGE 2 - 3  Dietary changes: yes       Details: up to eating protein and some vegetables after procedure  Health status changes: no    Bleeding/hemorrhagic complications: no    Recent/future hospitalizations: no    Any changes in medication regimen? yes       Details: decreasing amiodarone to 200mg  daily  Recent/future dental: no  Any missed doses?: yes     Details: missed 1/2 tablet about 2 weeks ago    Allergies: 1)  ! * Dental Anesthesia With Epinephrine  Anticoagulation Management History:      The patient is taking warfarin and comes in today for a routine follow up visit.  Positive risk factors for bleeding include an age of 65 years or older and history of CVA/TIA.  The bleeding index is 'intermediate risk'.  Positive CHADS2 values include History of CHF and Prior Stroke/CVA/TIA.  Negative CHADS2 values include Age > 28 years old.  The start date was 07/25/2003.  Her last INR was 1.3 ratio.  Anticoagulation responsible provider: Graciela Husbands MD, Viviann Spare.  INR POC: 2.0.  Exp: 08/2011.    Anticoagulation Management Assessment/Plan:      The patient's current anticoagulation dose is Coumadin 5 mg tabs: Take as directed by coumadin clinic..  The target INR is 2 - 3.  The next INR is due 09/20/2010.  Anticoagulation instructions were given to patient.  Results were reviewed/authorized by Weston Brass, PharmD.  She was notified by Weston Brass PharmD.         Prior Anticoagulation Instructions: INR 2.8 Continue same dose of 1tab every day. Recheck in 3wks.  Current Anticoagulation Instructions: INR 2.0  Increase dose to 1/2 tablet every day except 1 tablet on Thursday.  Recheck INR in 3 weeks.

## 2010-10-25 NOTE — Progress Notes (Signed)
Summary: lmom   12/27    pt needs fasting labs--came in 12/29  Phone Note Call from Patient   Summary of Call: Fasting labs should be performed to assess risk for neurovascular disease progression:   [facility] Heart Panel (1304X), BUN, creat & A1c           ( Code: PVD,272.4) (per Dr Alwyn Ren) Initial call taken by: Jerolyn Shin,  September 18, 2010 10:02 AM  Follow-up for Phone Call        Came in for labs on 09/20/2010.Jerolyn Shin  September 21, 2010 11:19 AM Follow-up by: Jerolyn Shin,  September 21, 2010 11:19 AM

## 2010-10-25 NOTE — Progress Notes (Signed)
Summary: Hematuria  Phone Note Call from Patient   Reason for Call: Talk to Doctor Summary of Call: Returned call from patient concerning hematuria.  Pt is scheduled for surgery this week and was being bridged with lovenox prior to surgury.  She has taken 3 doses of lovenox and discontinued her coumadin as directed.  The patient began to have hematuria today.  After discussing with Dr. Daleen Squibb, it was decided to hold the patient's lovenox and for her to follow up in the coumadin clinic in the morning for PT and CBC.  Further plans will be dependent upon these results.  Pt voiced understanding.  Initial call taken by: Robbi Garter NP-PA,  July 08, 2010 4:35 PM

## 2010-10-25 NOTE — Consult Note (Signed)
Summary: [facility]   Imported By: Lanelle Bal 09/19/2010 09:35:45  _____________________________________________________________________  External Attachment:    Type:   Image     Comment:   External Document

## 2010-10-25 NOTE — Medication Information (Signed)
Summary: rov/tm  Anticoagulant Therapy  Managed by: Elaina Pattee, PharmD Referring MD: Sherryl Manges MD PCP: Marga Melnick MD Supervising MD: Tenny Craw MD, Gunnar Fusi Indication 1: Atrial Fibrillation (ICD-427.31) Lab Used: [facility] Site: [facility] INR POC 2.4 INR RANGE 2 - 3  Dietary changes: no    Health status changes: no    Bleeding/hemorrhagic complications: no    Recent/future hospitalizations: no    Any changes in medication regimen? no    Recent/future dental: no  Any missed doses?: no       Is patient compliant with meds? yes       Allergies: 1)  ! * Dental Anesthesia With Epinephrine  Anticoagulation Management History:      The patient is taking warfarin and comes in today for a routine follow up visit.  Positive risk factors for bleeding include an age of 67 years or older and history of CVA/TIA.  The bleeding index is 'intermediate risk'.  Positive CHADS2 values include History of CHF and Prior Stroke/CVA/TIA.  Negative CHADS2 values include Age > 45 years old.  The start date was 07/25/2003.  Her last INR was 2.7.  Anticoagulation responsible provider: Tenny Craw MD, Gunnar Fusi.  INR POC: 2.4.  Cuvette Lot#: [id number].  Exp: 04/2011.    Anticoagulation Management Assessment/Plan:      The patient's current anticoagulation dose is Coumadin 5 mg tabs: Take as directed by coumadin clinic..  The target INR is 2 - 3.  The next INR is due 03/29/2010.  Anticoagulation instructions were given to patient.  Results were reviewed/authorized by Elaina Pattee, PharmD.  She was notified by Elaina Pattee, PharmD.         Prior Anticoagulation Instructions: INR 3.0 Continue 2.5mg  daily except 5mg s on Mondays and Fridays. Recheck in 4 weeks.   Current Anticoagulation Instructions: INR 2.4. Take 0.5 tablet daily except 1 tablet on Mon and Fri.  Recheck in 4 weeks.

## 2010-10-25 NOTE — Progress Notes (Signed)
  Pt.left papers form [facility] forwarded to Message NUrse Cala Bradford Mesiemore  November 10, 2009 1:30 PM

## 2010-10-25 NOTE — Assessment & Plan Note (Signed)
Summary: bariatric surg 17 days ago, numerous episodes of almost passi...   Vital Signs:  Patient profile:   65 year old female Weight:      237 pounds BMI:     38.39 O2 Sat:      95 % on Room air Pulse rate:   63 / minute Resp:     17 per minute BP sitting:   90 / 64  (left arm)  Vitals Entered By: Doristine Devoid CMA (July 27, 2010 12:15 PM)  O2 Flow:  Room air CC: lap band sugery 17 days ago feels like she is about to pass out says it starts w/ pain in lower back goes up through neck , Back pain Comments - had work up at hospital yesterday and everything was normal   Primary Care Provider:  Marga Melnick MD  CC:  lap band sugery 17 days ago feels like she is about to pass out says it starts w/ pain in lower back goes up through neck  and Back pain.  History of Present Illness: Back Pain      This is a 65 year old woman who presents with Back pain.  The patient denies fever, chills, weakness, loss of sensation, fecal incontinence, urinary incontinence, urinary retention, and dysuria.  The pain is located in the right & left  inferior  thoracic back . The pain began suddenly  2 days after  Bariatric Surgery. The pain  also occurs in  the  posterior  neck  after its  initiation in the thoracic area.  The pain is made worse by activity, specifically using arms in tasks  such as brushing teeth, drying hair  or washing dishes.  The pain is made better by inactivity.  She is having dyspnea with minimal exertion . The pain is associated with near syncope.  Labs done @ [facility] 11/03 reviewed & copy given to her.Creat 1.05, BUN 24,glucose 93, H/H 11.8 /35.9.  Current Medications (verified): 1)  Amiodarone Hcl 200 Mg Tabs (Amiodarone Hcl) .... Take 1 Tablet Two Times A Day 2)  Lasix 40 Mg Tabs (Furosemide) .... One Tab By Mouth Daily As Directed 3)  Coumadin 5 Mg Tabs (Warfarin Sodium) .... Take As Directed By Coumadin Clinic. 4)  Levothyroxine Sodium 200 Mcg Tabs (Levothyroxine Sodium)  .... Take 1 Tablet Daily 5)  Tylenol Pm Extra Strength 500-25 Mg Tabs (Diphenhydramine-Apap (Sleep)) .... As Needed 6)  Multivitamins  Tabs (Multiple Vitamin) .... Take 1 Tablet By Mouth Once A Day 7)  Calcium 500 Mg Tabs (Calcium) .Marland Kitchen.. 1 By Mouth Once Daily 8)  Vitamin D3 1000 Unit Tabs (Cholecalciferol) .Marland Kitchen.. 1 By Mouth Once Daily 9)  Arimidex 1 Mg Tabs (Anastrozole) .... Take 1 Tab Daily 10)  Crestor 20 Mg Tabs (Rosuvastatin Calcium) .... Take One Tablet M,w,f At Bedtime  Allergies (verified): 1)  ! * Dental Anesthesia With Epinephrine  Review of Systems CV:  Denies difficulty breathing at night, difficulty breathing while lying down, and swelling of feet. Resp:  Denies chest pain with inspiration and coughing up blood.  Physical Exam  General:  in no acute distress; alert,appropriate and cooperative throughout examination Eyes:  No corneal or conjunctival inflammation noted. . No icterusision grossly normal. Mouth:  Oral mucosa and oropharynx without lesions or exudates.  Teeth in good repair. No pharyngeal erythema.   Lungs:  Normal respiratory effort, chest expands symmetrically. Lungs are clear to auscultation, no crackles or wheezes. Heart:  normal rate, regular rhythm, no gallop,  no rub, no JVD, no HJR, and grade 1 /6 systolic murmur.   Abdomen:  Bowel sounds positive,abdomen soft  scattered slightly tender  areas in lateral abdomen;without masses, organomegaly or hernias noted. Pulses:  R and L carotid,radial,dorsalis pedis and posterior tibial pulses are full and equal bilaterally Extremities:  trace left pedal edema and trace right pedal edema.  Neg Homan's  Neurologic:  alert & oriented X3.   Skin:  Residual of adhesive over abdomen. No  jaundice Cervical Nodes:  No lymphadenopathy noted Axillary Nodes:  No palpable lymphadenopathy Psych:  memory intact for recent and remote, normally interactive, and good eye contact.     Impression & Recommendations:  Problem # 1:   BACK PAIN, THORACIC REGION (ICD-724.1) R/O PTE The following medications were removed from the medication list:    [facility] Aspirin 81 Mg Tbec (Aspirin) .Marland Kitchen... Take 1 tablet by mouth once a day  Orders: Venipuncture (16109) T-D-Dimer [facility]) [phone]) T-Troponin I [phone]) Radiology Referral (Radiology) Specimen Handling (21308) TLB-Cardiac Panel [phone])  Problem # 2:  SYNCOPE AND COLLAPSE (ICD-780.2)  near syncope due to # 1  Orders: T-D-Dimer [facility]) [phone]) T-Troponin I [phone]) Radiology Referral (Radiology) Specimen Handling (47425) TLB-Cardiac Panel [phone])  Problem # 3:  DYSPNEA/SHORTNESS OF BREATH (ICD-786.09)  Her updated medication list for this problem includes:    Lasix 40 Mg Tabs (Furosemide) ..... One tab by mouth daily as directed  Orders: T-D-Dimer [facility]) [phone]) T-Troponin I [phone]) Radiology Referral (Radiology) Specimen Handling (35573) TLB-Cardiac Panel [phone])  Complete Medication List: 1)  Amiodarone Hcl 200 Mg Tabs (Amiodarone hcl) .... Take 1 tablet two times a day 2)  Lasix 40 Mg Tabs (Furosemide) .... One tab by mouth daily as directed 3)  Coumadin 5 Mg Tabs (Warfarin sodium) .... Take as directed by coumadin clinic. 4)  Levothyroxine Sodium 200 Mcg Tabs (Levothyroxine sodium) .... Take 1 tablet daily 5)  Tylenol Pm Extra Strength 500-25 Mg Tabs (Diphenhydramine-apap (sleep)) .... As needed 6)  Multivitamins Tabs (Multiple vitamin) .... Take 1 tablet by mouth once a day 7)  Calcium 500 Mg Tabs (Calcium) .Marland Kitchen.. 1 by mouth once daily 8)  Vitamin D3 1000 Unit Tabs (Cholecalciferol) .Marland Kitchen.. 1 by mouth once daily 9)  Arimidex 1 Mg Tabs (Anastrozole) .... Take 1 tab daily 10)  Crestor 20 Mg Tabs (Rosuvastatin calcium) .... Take one tablet m,w,f at bedtime  Patient Instructions: 1)  CT angiogram needed to rule out emboli as discussed.   Orders Added: 1)  Est. Patient Level IV  [28315] 2)  Venipuncture [17616] 3)  T-D-Dimer [facility]) [[id number]-[id number]] 4)  T-Troponin I [[id number]] 5)  Radiology Referral [Radiology] 6)  Specimen Handling [99000] 7)  TLB-Cardiac Panel [[id number]]

## 2010-10-25 NOTE — Assessment & Plan Note (Signed)
Summary: weight check/cbs   Vital Signs:  Patient profile:   65 year old female Weight:      261.0 pounds BMI:     42.28 Pulse rate:   60 / minute Resp:     14 per minute BP sitting:   120 / 74  (left arm) Cuff size:   large  Vitals Entered By: Shonna Chock CMA (June 14, 2010 10:21 AM) CC: 1 month follow-up   Primary Care Provider:  Marga Melnick MD  CC:  1 month follow-up.  History of Present Illness: This is the last required pre Bariatric Surgery visit. Since last visit she has not returned to exercise since the cellulitis episode. Weight is essentially stable; she has had some increased salt & carbohydrate intake eating out , entertaining relative from out of town. Crestor has been restarted @ 20 mg M,W & F based on recent Lipid report. Hyperlipidemia Follow-Up      This is a 65 year old woman who also  presents for Hyperlipidemia follow-up.  The patient reports fatigue, but denies persistant  muscle aches, GI upset, abdominal pain, flushing, itching, constipation, and diarrhea.  Other symptoms include exercise intolerance with walking but not @ gym and pedal edema with recent salt intake.  The patient denies the following symptoms: chest pain/pressure, dypsnea, palpitations, and syncope.  Dietary compliance had been excellent with 5 # weight loss until entertaining last week.  The patient reports no exercise X 3 weeks.  Adjunctive measures currently used by the patient include ASA and fish oil supplements.    Current Medications (verified): 1)  [facility] Aspirin 81 Mg Tbec (Aspirin) .... Take 1 Tablet By Mouth Once A Day 2)  Amiodarone Hcl 200 Mg Tabs (Amiodarone Hcl) .... Take 1 Tablet Two Times A Day 3)  Lasix 40 Mg Tabs (Furosemide) .... One Tab By Mouth Daily As Directed 4)  Coumadin 5 Mg Tabs (Warfarin Sodium) .... Take As Directed By Coumadin Clinic. 5)  Levothyroxine Sodium 200 Mcg Tabs (Levothyroxine Sodium) .... Take 1 Tablet Daily 6)  Tylenol Pm Extra Strength  500-25 Mg Tabs (Diphenhydramine-Apap (Sleep)) .... As Needed 7)  Multivitamins  Tabs (Multiple Vitamin) .... Take 1 Tablet By Mouth Once A Day 8)  Calcium 500 Mg Tabs (Calcium) .Marland Kitchen.. 1 By Mouth Once Daily 9)  Vitamin D3 1000 Unit Tabs (Cholecalciferol) .Marland Kitchen.. 1 By Mouth Once Daily 10)  Arimidex 1 Mg Tabs (Anastrozole) .... Take 1 Tab Daily 11)  Biotin 5000 Mcg Caps (Biotin) .Marland Kitchen.. 1 By Mouth Once Daily 12)  Crestor 20 Mg Tabs (Rosuvastatin Calcium) .... Take One Tablet M,w,f At Bedtime  Allergies: 1)  ! * Dental Anesthesia With Epinephrine  Physical Exam  General:  in no acute distress; alert,appropriate and cooperative throughout examination Lungs:  Normal respiratory effort, chest expands symmetrically. Lungs are clear to auscultation, no crackles or wheezes. Heart:  normal rate, regular rhythm, no gallop, no rub, no JVD, no HJR, and grade  /6 systolic murmur LSB with L carotid radiation.   Abdomen:  Bowel sounds positive,abdomen soft and non-tender without masses, organomegaly or hernias noted. Pulses:  R and L carotid,radial,dorsalis pedis and posterior tibial pulses are full and equal bilaterally Extremities:  trace left pedal edema and trace right pedal edema.   Neurologic:  alert & oriented X3.   Skin:  8X 6 cm faint hyperpigmentation R shin w/o increased temp or active cellulitis Psych:  memory intact for recent and remote, normally interactive, good eye contact, not anxious appearing,  and not depressed appearing.     Impression & Recommendations:  Problem # 1:  MORBID OBESITY (ICD-278.01)  Problem # 2:  EDEMA- LOCALIZED (ICD-782.3)  Her updated medication list for this problem includes:    Lasix 40 Mg Tabs (Furosemide) ..... One tab by mouth daily as directed  Problem # 3:  HYPERLIPIDEMIA (ICD-272.2)  Her updated medication list for this problem includes:    Crestor 20 Mg Tabs (Rosuvastatin calcium) .Marland Kitchen... Take one tablet m,w,f at bedtime  Complete Medication List: 1)  [facility] Aspirin 81 Mg Tbec (Aspirin) .... Take 1 tablet by mouth once a day 2)  Amiodarone Hcl 200 Mg Tabs (Amiodarone hcl) .... Take 1 tablet two times a day 3)  Lasix 40 Mg Tabs (Furosemide) .... One tab by mouth daily as directed 4)  Coumadin 5 Mg Tabs (Warfarin sodium) .... Take as directed by coumadin clinic. 5)  Levothyroxine Sodium 200 Mcg Tabs (Levothyroxine sodium) .... Take 1 tablet daily 6)  Tylenol Pm Extra Strength 500-25 Mg Tabs (Diphenhydramine-apap (sleep)) .... As needed 7)  Multivitamins Tabs (Multiple vitamin) .... Take 1 tablet by mouth once a day 8)  Calcium 500 Mg Tabs (Calcium) .Marland Kitchen.. 1 by mouth once daily 9)  Vitamin D3 1000 Unit Tabs (Cholecalciferol) .Marland Kitchen.. 1 by mouth once daily 10)  Arimidex 1 Mg Tabs (Anastrozole) .... Take 1 tab daily 11)  Biotin 5000 Mcg Caps (Biotin) .Marland Kitchen.. 1 by mouth once daily 12)  Crestor 20 Mg Tabs (Rosuvastatin calcium) .... Take one tablet m,w,f at bedtime  Other Orders: Pneumococcal Vaccine (16109) Admin 1st Vaccine (60454)  Patient Instructions: 1)  Check fasting Lipids & hepatic 10 weeks after Crestor was restarted. 2)  Limit your Sodium (Salt) to less than 4 grams a day (slightly less than 1 teaspoon) to prevent fluid retention, swelling, or worsening or symptoms.   Immunizations Administered:  Pneumonia Vaccine:    Vaccine Type: Pneumovax (Medicare)    Site: right deltoid    Mfr: Merck    Dose: 0.5 ml    Route: IM    Given by: Shonna Chock CMA    Exp. Date: 12/06/2011    Lot #: [id number]    VIS given: 08/28/09 version given June 14, 2010.

## 2010-10-25 NOTE — Progress Notes (Signed)
Summary: med alternative and questions   Phone Note Call from Patient Call back at Home Phone [phone]   Summary of Call: Patient left message on triage that she needs alternative for Crestor (cost her $130) and she would like alternative. Patient ? if she should take Niaspan. She would also like to know about her [facility] Heart Panel. She would also like to know if she can go to [place] which is over [place]000 feet. Please advise. Initial call taken by: Lucious Groves CMA,  October 03, 2010 3:30 PM  Follow-up for Phone Call        We received [facility] Panel; please do not renew Crestor , but make appt to review panel which defines risks  & options Follow-up by: Marga Melnick MD,  October 03, 2010 4:13 PM  Additional Follow-up for Phone Call Additional follow up Details #1::        Patient notified. Additional Follow-up by: Lucious Groves CMA,  October 03, 2010 4:19 PM

## 2010-10-25 NOTE — Medication Information (Signed)
Summary: Kayla Harrison  Anticoagulant Therapy  Managed by: Bethena Midget, RN, BSN Referring MD: Sherryl Manges MD PCP: Marga Melnick MD Supervising MD: Gala Romney MD, Reuel Boom Indication 1: Atrial Fibrillation (ICD-427.31) Lab Used: [facility] Site: [facility] INR POC 2.7 INR RANGE 2 - 3  Dietary changes: no    Health status changes: no    Bleeding/hemorrhagic complications: no    Recent/future hospitalizations: no    Any changes in medication regimen? yes       Details: Arimedex started 3 days ago PO daily.  Pepcid started today Zantac discontinued.   Recent/future dental: no  Any missed doses?: no       Is patient compliant with meds? yes       Current Medications (verified): 1)  [facility] Aspirin 81 Mg Tbec (Aspirin) .... Take 1 Tablet By Mouth Once A Day 2)  Amiodarone Hcl 200 Mg Tabs (Amiodarone Hcl) .... Take 1 Tablet Two Times A Day 3)  Lasix 40 Mg Tabs (Furosemide) .... One Tab By Mouth Dailey,as Directed 4)  Coumadin 5 Mg Tabs (Warfarin Sodium) .... Take As Directed By Coumadin Clinic. 5)  Levothyroxine Sodium 200 Mcg Tabs (Levothyroxine Sodium) .... Take 1 Tablet Daily 6)  Tylenol Pm Extra Strength 500-25 Mg Tabs (Diphenhydramine-Apap (Sleep)) .... As Needed 7)  Fish Oil 1200 Mg Caps (Omega-3 Fatty Acids) .... Take 1 Capsule By Mouth Once A Day 8)  Multivitamins  Tabs (Multiple Vitamin) .... Take 1 Tablet By Mouth Once A Day 9)  Calcium Carbonate-Vitamin D 600-400 Mg-Unit  Tabs (Calcium Carbonate-Vitamin D) .Marland Kitchen.. 1 By Mouth Once Daily 10)  Vitamin D3 2000 Unit Caps (Cholecalciferol) .... Take 1 Tab Once Daily 11)  Pepcid 40 Mg Tabs (Famotidine) .... Take 1 Tab Once Daily 12)  Arimidex 1 Mg Tabs (Anastrozole) .... Take 1 Tab Daily  Allergies: 1)  ! * Dental Anesthesia With Epinephrine  Anticoagulation Management History:      The patient is taking warfarin and comes in today for a routine follow up visit.  Positive risk factors for bleeding include an age of 51 years  or older and history of CVA/TIA.  The bleeding index is 'intermediate risk'.  Positive CHADS2 values include History of CHF and Prior Stroke/CVA/TIA.  Negative CHADS2 values include Age > 23 years old.  The start date was 07/25/2003.  Her last INR was 2.7.  Anticoagulation responsible provider: Shakendra Griffeth MD, Reuel Boom.  INR POC: 2.7.  Cuvette Lot#: [id number].  Exp: 01/2011.    Anticoagulation Management Assessment/Plan:      The patient's current anticoagulation dose is Coumadin 5 mg tabs: Take as directed by coumadin clinic..  The target INR is 2 - 3.  The next INR is due 01/11/2010.  Anticoagulation instructions were given to patient.  Results were reviewed/authorized by Bethena Midget, RN, BSN.  She was notified by Bethena Midget, RN, BSN.         Prior Anticoagulation Instructions: INR 2.6  Continue on same dosage 1/2 tablet daily except 1 tablet on Mondays and Fridays.  Recheck in 4 weeks.    Current Anticoagulation Instructions: INR 2.7 Continue 2.5mg s daily except 5mg s on Mondays and Fridays. Recheck in 4 weeks.

## 2010-10-25 NOTE — Medication Information (Signed)
Summary: rov/tm  Anticoagulant Therapy  Managed by: Cloyde Reams, RN, BSN Referring MD: Sherryl Manges MD PCP: Marga Melnick MD Supervising MD: Ladona Ridgel MD, Sharlot Gowda Indication 1: Atrial Fibrillation (ICD-427.31) Lab Used: [facility] Site: [facility] INR POC 2.2 INR RANGE 2 - 3  Dietary changes: yes       Details: Incr in salad intak  Health status changes: yes       Details: Pt fell and injured R leg, cellulitis and swelling present, saw 1 MD rx Tramadol prn.   Bleeding/hemorrhagic complications: no    Recent/future hospitalizations: no    Any changes in medication regimen? yes       Details: Tramadol prn, IBU prn. Starting on Cephalexin 500mg  bid x 7 days.    Recent/future dental: no  Any missed doses?: no       Is patient compliant with meds? yes      Comments: Going out of town can't return until 07/10/10.    Allergies: 1)  ! * Dental Anesthesia With Epinephrine  Anticoagulation Management History:      The patient is taking warfarin and comes in today for a routine follow up visit.  Positive risk factors for bleeding include an age of 22 years or older and history of CVA/TIA.  The bleeding index is 'intermediate risk'.  Positive CHADS2 values include History of CHF and Prior Stroke/CVA/TIA.  Negative CHADS2 values include Age > 32 years old.  The start date was 07/25/2003.  Her last INR was 2.7.  Anticoagulation responsible provider: Ladona Ridgel MD, Sharlot Gowda.  INR POC: 2.2.  Cuvette Lot#: [id number].  Exp: 07/2011.    Anticoagulation Management Assessment/Plan:      The patient's current anticoagulation dose is Coumadin 5 mg tabs: Take as directed by coumadin clinic..  The target INR is 2 - 3.  The next INR is due 07/10/2010.  Anticoagulation instructions were given to patient.  Results were reviewed/authorized by Cloyde Reams, RN, BSN.  She was notified by Cloyde Reams RN.         Prior Anticoagulation Instructions: INR 2.8 Continue 2.5mg s daily except 5mg s on Mondays and  Fridays. Recheck in 4 weeks.   Current Anticoagulation Instructions: INR 2.2  Continue on same dosage 1/2 tablet daily and 1 tablet on Mondays and Fridays.  Recheck in 4 weeks.   Prescriptions: LASIX 40 MG TABS (FUROSEMIDE) one tab by mouth daily as directed  #30 x 1   Entered by:   Cloyde Reams RN   Authorized by:   Nathen May, MD, [facility]   Signed by:   Cloyde Reams RN on 05/31/2010   Method used:   Electronically to        [facility] (retail)       [address]       [place], [place]  [address]       Ph: [phone] or [phone]       Fax: [phone]   RxID:   [id number]

## 2010-10-25 NOTE — Letter (Signed)
Summary: [facility]   Imported By: Lanelle Bal 09/28/2009 09:21:41  _____________________________________________________________________  External Attachment:    Type:   Image     Comment:   External Document

## 2010-10-25 NOTE — Progress Notes (Signed)
Summary: Results/rx needed  Phone Note Call from Patient   Summary of Call: Patient notified of test results and pending [facility] Panel. Patient does need new Crestor prescription. She is aware we will send electronically.  Initial call taken by: Lucious Groves CMA,  September 27, 2010 2:35 PM    Prescriptions: CRESTOR 20 MG TABS (ROSUVASTATIN CALCIUM) take one tablet M,W,F at bedtime  #30 x 1   Entered by:   Lucious Groves CMA   Authorized by:   Marga Melnick MD   Signed by:   Lucious Groves CMA on 09/27/2010   Method used:   Electronically to        [facility] (retail)       [address]       [place], [place]  [address]       Ph: [phone] or [phone]       Fax: [phone]   RxID:   [id number]

## 2010-10-25 NOTE — Letter (Signed)
[facility]   Imported By: Lanelle Bal 01/08/2010 11:23:22  _____________________________________________________________________  External Attachment:    Type:   Image     Comment:   External Document

## 2010-10-25 NOTE — Medication Information (Signed)
Summary: rov/ewj  Anticoagulant Therapy  Managed by: Cloyde Reams, RN, BSN Referring MD: Sherryl Manges MD PCP: Marga Melnick MD Supervising MD: Clifton James MD, Cristal Deer Indication 1: Atrial Fibrillation (ICD-427.31) Lab Used: [facility] Site: [facility] INR POC 2.6 INR RANGE 2 - 3  Dietary changes: yes       Details: Incr in vit K foods.    Health status changes: no    Bleeding/hemorrhagic complications: no    Recent/future hospitalizations: no    Any changes in medication regimen? no    Recent/future dental: no  Any missed doses?: no       Is patient compliant with meds? yes      Comments: Completed Radiation.  Will be starting on Arimidex.    Allergies: 1)  ! * Dental Anesthesia With Epinephrine  Anticoagulation Management History:      The patient is taking warfarin and comes in today for a routine follow up visit.  Positive risk factors for bleeding include an age of 15 years or older and history of CVA/TIA.  The bleeding index is 'intermediate risk'.  Positive CHADS2 values include History of CHF and Prior Stroke/CVA/TIA.  Negative CHADS2 values include Age > 61 years old.  The start date was 07/25/2003.  Her last INR was 2.7.  Anticoagulation responsible provider: Clifton James MD, Cristal Deer.  INR POC: 2.6.  Cuvette Lot#: [id number].  Exp: 01/2011.    Anticoagulation Management Assessment/Plan:      The patient's current anticoagulation dose is Coumadin 5 mg tabs: Take as directed by coumadin clinic., Coumadin 2.5 mg tabs: take one daily.  The target INR is 2 - 3.  The next INR is due 12/14/2009.  Anticoagulation instructions were given to patient.  Results were reviewed/authorized by Cloyde Reams, RN, BSN.  She was notified by Cloyde Reams RN.         Prior Anticoagulation Instructions: INR 2.3  Continue on same dosage 1/2 tablet daily except 1 tablet on Mondays and Fridays.  Recheck in 4 weeks.      Current Anticoagulation Instructions: INR 2.6  Continue on  same dosage 1/2 tablet daily except 1 tablet on Mondays and Fridays.  Recheck in 4 weeks.

## 2010-10-25 NOTE — Letter (Signed)
Summary: [facility]  [facility]   Imported By: Lanelle Bal 06/25/2010 14:09:59  _____________________________________________________________________  External Attachment:    Type:   Image     Comment:   External Document

## 2010-10-25 NOTE — Medication Information (Signed)
Summary: rov/cb  Anticoagulant Therapy  Managed by: Bethena Midget, RN, BSN Referring MD: Sherryl Manges MD PCP: Marga Melnick MD Supervising MD: Johney Frame MD, Fayrene Fearing Indication 1: Atrial Fibrillation (ICD-427.31) Lab Used: [facility] Site: [facility] INR POC 3.0 INR RANGE 2 - 3  Dietary changes: yes       Details: alittle less green leafy veggies  Health status changes: no    Bleeding/hemorrhagic complications: no    Recent/future hospitalizations: no    Any changes in medication regimen? no    Recent/future dental: no  Any missed doses?: no       Is patient compliant with meds? yes       Current Medications (verified): 1)  [facility] Aspirin 81 Mg Tbec (Aspirin) .... Take 1 Tablet By Mouth Once A Day 2)  Amiodarone Hcl 200 Mg Tabs (Amiodarone Hcl) .... Take 1 Tablet Two Times A Day 3)  Lasix 40 Mg Tabs (Furosemide) .... One Tab By Mouth Daily As Directed 4)  Coumadin 5 Mg Tabs (Warfarin Sodium) .... Take As Directed By Coumadin Clinic. 5)  Levothyroxine Sodium 200 Mcg Tabs (Levothyroxine Sodium) .... Take 1 Tablet Daily 6)  Tylenol Pm Extra Strength 500-25 Mg Tabs (Diphenhydramine-Apap (Sleep)) .... As Needed 7)  Fish Oil 1000 Mg Caps (Omega-3 Fatty Acids) .... Take 1 Tab Once Daily 8)  Multivitamins  Tabs (Multiple Vitamin) .... Take 1 Tablet By Mouth Once A Day 9)  Calcium Carbonate-Vitamin D 600-400 Mg-Unit  Tabs (Calcium Carbonate-Vitamin D) .Marland Kitchen.. 1 By Mouth Once Daily 10)  Vitamin D3 2000 Unit Caps (Cholecalciferol) .... Take 1 Tab Once Daily 11)  Pepcid 40 Mg Tabs (Famotidine) .... As Needed 12)  Arimidex 1 Mg Tabs (Anastrozole) .... Take 1 Tab Daily 13)  Clonidine Hcl 0.1 Mg Tabs (Clonidine Hcl) .... As Needed  Allergies: 1)  ! * Dental Anesthesia With Epinephrine  Anticoagulation Management History:      The patient is taking warfarin and comes in today for a routine follow up visit.  Positive risk factors for bleeding include an age of 65 years or older and  history of CVA/TIA.  The bleeding index is 'intermediate risk'.  Positive CHADS2 values include History of CHF and Prior Stroke/CVA/TIA.  Negative CHADS2 values include Age > 67 years old.  The start date was 07/25/2003.  Her last INR was 2.7.  Anticoagulation responsible provider: Benicio Manna MD, Fayrene Fearing.  INR POC: 3.0.  Cuvette Lot#: [id number].  Exp: 04/2011.    Anticoagulation Management Assessment/Plan:      The patient's current anticoagulation dose is Coumadin 5 mg tabs: Take as directed by coumadin clinic..  The target INR is 2 - 3.  The next INR is due 03/01/2010.  Anticoagulation instructions were given to patient.  Results were reviewed/authorized by Bethena Midget, RN, BSN.  She was notified by Bethena Midget, RN, BSN.         Prior Anticoagulation Instructions: INR 2.4. Take 0.5 tablet daily except 1 tablet Mon and Fri.  Current Anticoagulation Instructions: INR 3.0 Continue 2.5mg  daily except 5mg s on Mondays and Fridays. Recheck in 4 weeks.

## 2010-10-25 NOTE — Progress Notes (Signed)
Summary: Guidelines for Bariatric Surgery Brought by Patient  Guidelines for Bariatric Surgery Brought by Patient   Imported By: Lanelle Bal 01/22/2010 11:36:32  _____________________________________________________________________  External Attachment:    Type:   Image     Comment:   External Document

## 2010-10-25 NOTE — Medication Information (Signed)
Summary: rov/cs  Anticoagulant Therapy  Managed by: Leota Sauers, PharmD, BCPS, CPP Referring MD: Sherryl Manges MD PCP: Marga Melnick MD Supervising MD: Gala Romney MD, Reuel Boom Indication 1: Atrial Fibrillation (ICD-427.31) Lab Used: [facility] Site: [facility] INR POC 3.7 INR RANGE 2 - 3  Dietary changes: no    Health status changes: no    Bleeding/hemorrhagic complications: no    Recent/future hospitalizations: no    Any changes in medication regimen? yes       Details: Stopped roxicet and promethazine  Recent/future dental: no  Any missed doses?: no       Is patient compliant with meds? yes      Comments: Pt still on a liquid diet - and has not had any green vegetables.    Allergies: 1)  ! * Dental Anesthesia With Epinephrine  Anticoagulation Management History:      The patient is taking warfarin and comes in today for a routine follow up visit.  Positive risk factors for bleeding include an age of 65 years or older and history of CVA/TIA.  The bleeding index is 'intermediate risk'.  Positive CHADS2 values include History of CHF and Prior Stroke/CVA/TIA.  Negative CHADS2 values include Age > 16 years old.  The start date was 07/25/2003.  Her last INR was 1.3 ratio.  Anticoagulation responsible Miro Balderson: Bensimhon MD, Reuel Boom.  INR POC: 3.7.  Cuvette Lot#: [id number].  Exp: 07/2011.    Anticoagulation Management Assessment/Plan:      The patient's current anticoagulation dose is Coumadin 5 mg tabs: Take as directed by coumadin clinic..  The target INR is 2 - 3.  The next INR is due 07/26/2010.  Anticoagulation instructions were given to patient.  Results were reviewed/authorized by Leota Sauers, PharmD, BCPS, CPP.  She was notified by Haynes Hoehn, PharmD Candidate.         Prior Anticoagulation Instructions: INR 2.0  Stop lovenox injections and resume normal Coumadin schedule: 1/2 tablet every day of the week, except 1 tablet on Monday and Friday.  Return to clinic in 1  week.    Current Anticoagulation Instructions: INR  3.7  Skip today's dose, and take 1/2 tablet on Friday.  Then resume Coumadin schedule of 1 tablet on Monday, and 1/2 tablet all other days.  Return to clinic on November 3rd.

## 2010-10-25 NOTE — Medication Information (Signed)
Summary: rov/nb  Anticoagulant Therapy  Managed by: Darrick Penna Referring MD: Sherryl Manges MD PCP: Marga Melnick MD Supervising MD: Ladona Ridgel MD, Sharlot Gowda Indication 1: Atrial Fibrillation (ICD-427.31) Lab Used: [facility] Site: [facility] INR POC 2.8 INR RANGE 2 - 3  Dietary changes: yes       Details: only eating protein  Health status changes: yes       Details: Lap Band procedure  Bleeding/hemorrhagic complications: no    Recent/future hospitalizations: no    Any changes in medication regimen? no    Recent/future dental: no  Any missed doses?: no       Is patient compliant with meds? yes       Allergies: 1)  ! * Dental Anesthesia With Epinephrine  Anticoagulation Management History:      The patient is taking warfarin and comes in today for a routine follow up visit.  Positive risk factors for bleeding include an age of 65 years or older and history of CVA/TIA.  The bleeding index is 'intermediate risk'.  Positive CHADS2 values include History of CHF and Prior Stroke/CVA/TIA.  Negative CHADS2 values include Age > 62 years old.  The start date was 07/25/2003.  Her last INR was 1.3 ratio.  Anticoagulation responsible Kessa Fairbairn: Ladona Ridgel MD, Sharlot Gowda.  INR POC: 2.8.  Exp: 08/2011.    Anticoagulation Management Assessment/Plan:      The patient's current anticoagulation dose is Coumadin 5 mg tabs: Take as directed by coumadin clinic..  The target INR is 2 - 3.  The next INR is due 08/27/2010.  Anticoagulation instructions were given to patient.  Results were reviewed/authorized by Darrick Penna.  She was notified by Weston Brass PharmD.         Prior Anticoagulation Instructions: INR 4.5  Hold dose for today and tomorrow then take 2.5 mg everyday.  Recheck INR in 2 weeks.  Current Anticoagulation Instructions: INR 2.8 Continue same dose of 1tab every day. Recheck in 3wks.

## 2010-10-25 NOTE — Medication Information (Signed)
Summary: rov/tm  Anticoagulant Therapy  Managed by: Elaina Pattee, PharmD Referring MD: Sherryl Manges MD PCP: Marga Melnick MD Supervising MD: Excell Seltzer MD, Casimiro Needle Indication 1: Atrial Fibrillation (ICD-427.31) Lab Used: [facility] Site: [facility] INR POC 2.4 INR RANGE 2 - 3  Dietary changes: yes       Details: Ate salads regularly about 2 weeks ago for a week, but back to normal intake now.  Health status changes: yes       Details: Pt complains of hematuria with exercise, but this is not excessive.  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  Bleeding/hemorrhagic complications: no    Recent/future hospitalizations: no    Any changes in medication regimen? no    Recent/future dental: no  Any missed doses?: no       Is patient compliant with meds? yes       Allergies: 1)  ! * Dental Anesthesia With Epinephrine  Anticoagulation Management History:      The patient is taking warfarin and comes in today for a routine follow up visit.  Positive risk factors for bleeding include an age of 65 years or older and history of CVA/TIA.  The bleeding index is 'intermediate risk'.  Positive CHADS2 values include History of CHF and Prior Stroke/CVA/TIA.  Negative CHADS2 values include Age > 38 years old.  The start date was 07/25/2003.  Her last INR was 2.7.  Anticoagulation responsible provider: Excell Seltzer MD, Casimiro Needle.  INR POC: 2.4.  Cuvette Lot#: [id number].  Exp: 01/2011.    Anticoagulation Management Assessment/Plan:      The patient's current anticoagulation dose is Coumadin 5 mg tabs: Take as directed by coumadin clinic..  The target INR is 2 - 3.  The next INR is due 02/01/2010.  Anticoagulation instructions were given to patient.  Results were reviewed/authorized by Elaina Pattee, PharmD.  She was notified by Elaina Pattee, PharmD.         Prior Anticoagulation Instructions: INR 2.7 Continue 2.5mg s daily except 5mg s on Mondays and Fridays. Recheck in 4 weeks.   Current Anticoagulation  Instructions: INR 2.4. Take 0.5 tablet daily except 1 tablet Mon and Fri.

## 2010-10-25 NOTE — Letter (Signed)
Summary: Results Follow up Letter  [facility] [place]  [address] [place], [place] [address]   Phone: [phone]  Fax: [phone]    10/09/2009 MRN: [id number]  Kayla Harrison [address] [place], [place]  [address]  Dear [name],  The following are the results of your recent test(s):  Test         Result    Pap Smear:        Normal _____  Not Normal _____ Comments: ______________________________________________________ Cholesterol: LDL(Bad cholesterol):         Your goal is less than:         HDL (Good cholesterol):       Your goal is more than: Comments:  ______________________________________________________ Mammogram:        Normal _____  Not Normal _____ Comments:  ___________________________________________________________________ Hemoccult:        Normal _____  Not normal _______ Comments:    _____________________________________________________________________ Other Tests: Please see attached labs done on 10/05/2009, call [phone] Option (0) to schedule an appointment prior to next refill    We routinely do not discuss normal results over the telephone.  If you desire a copy of the results, or you have any questions about this information we can discuss them at your next office visit.   Sincerely,

## 2010-10-25 NOTE — Medication Information (Signed)
Summary: rov   js  Anticoagulant Therapy  Managed by: Cloyde Reams, RN, BSN Referring MD: Sherryl Manges MD PCP: Marga Melnick MD Supervising MD: Jens Som MD, Arlys John Indication 1: Atrial Fibrillation (ICD-427.31) Lab Used: [facility] Site: [facility] INR POC 2.0 INR RANGE 2 - 3  Dietary changes: no    Health status changes: no    Bleeding/hemorrhagic complications: no    Recent/future hospitalizations: no    Any changes in medication regimen? no    Recent/future dental: no  Any missed doses?: no       Is patient compliant with meds? yes       Allergies (verified): 1)  ! * Dental Anesthesia With Epinephrine  Anticoagulation Management History:      The patient is taking warfarin and comes in today for a routine follow up visit.  Positive risk factors for bleeding include an age of 65 years or older and history of CVA/TIA.  The bleeding index is 'intermediate risk'.  Positive CHADS2 values include History of CHF and Prior Stroke/CVA/TIA.  Negative CHADS2 values include Age > 29 years old.  The start date was 07/25/2003.  Her last INR was 2.7.  Anticoagulation responsible provider: Jens Som MD, Arlys John.  INR POC: 2.0.  Cuvette Lot#: [id number].  Exp: 10/2010.    Anticoagulation Management Assessment/Plan:      The patient's current anticoagulation dose is Coumadin 5 mg tabs: Take as directed by coumadin clinic..  The target INR is 2 - 3.  The next INR is due 10/23/2009.  Anticoagulation instructions were given to patient.  Results were reviewed/authorized by Cloyde Reams, RN, BSN.  She was notified by Cloyde Reams RN.         Prior Anticoagulation Instructions: INR = 1.8  The patient's dosage of coumadin will be increased.  The new dosage includes: take 1 tablet on Mondays and Fridays and 1/2 tablet on the other days.  Start the 1 full tablet tonight.   Current Anticoagulation Instructions: INR 2.0  Continue on same dosage 1/2 tablet daily except 1 tablet on Mondays and  Fridays.   Recheck in 3 weeks.

## 2010-10-25 NOTE — Letter (Signed)
Summary: [facility]   Imported By: Lanelle Bal 08/14/2010 09:45:23  _____________________________________________________________________  External Attachment:    Type:   Image     Comment:   External Document

## 2010-10-25 NOTE — Assessment & Plan Note (Signed)
Summary: 1 MONTH OV--PH   Vital Signs:  Patient profile:   65 year old female Weight:      268.0 pounds Temp:     98.7 degrees F oral Pulse rate:   60 / minute Resp:     17 per minute BP sitting:   114 / 66  (left arm) Cuff size:   large  Vitals Entered By: Shonna Chock (Feb 13, 2010 1:31 PM) CC: 1 Month follow-up Comments REVIEWED MED LIST, PATIENT AGREED DOSE AND INSTRUCTION CORRECT    Primary Care Provider:  Marga Melnick MD  CC:  1 Month follow-up.  History of Present Illness: She is here for the 3rd meeting  Bariatric Protocol ; she has gained 6# since decreased portion control  & increased bread intake  @  beach trip ; also she has noted some fluid retention. appt with Nutritionist 02/27/2010. Allergies have flared as of 05/22.  Allergies: 1)  ! * Dental Anesthesia With Epinephrine  Review of Systems ENT:  Complains of nasal congestion, postnasal drainage, and sinus pressure; No facial pain or purulence. CV:  Complains of swelling of hands; denies chest pain or discomfort, difficulty breathing at night, and swelling of feet. Resp:  Denies shortness of breath and wheezing; Minor cough; sputum from PNDrainage. Allergy:  Complains of itching eyes and sneezing; Benadryl is causing somnulence.  Physical Exam  General:  in no acute distress; alert,appropriate and cooperative throughout examination;morbidly obese Ears:  External ear exam shows no significant lesions or deformities.  Otoscopic examination reveals clear canals, tympanic membranes are intact bilaterally without bulging, retraction, inflammation or discharge. Hearing is grossly normal bilaterally. Nose:  External nasal examination shows no deformity or inflammation. Nasal mucosa are pink and moist without lesions or exudates. Mouth:  Oral mucosa and oropharynx without lesions or exudates.  Teeth in good repair. Lungs:  Normal respiratory effort, chest expands symmetrically. Lungs are clear to auscultation, no  crackles or wheezes. Heart:  normal rate, regular rhythm, no gallop, no rub, no JVD, no HJR, and grade 1 /6 systolic murmur.   Abdomen:  Bowel sounds positive,abdomen soft and non-tender without masses, organomegaly or hernias noted. Pulses:  R and L dorsalis pedis and posterior tibial pulses are full and equal bilaterally Extremities:  1/2+ left pedal edema and 1/2+ right pedal edema.     Impression & Recommendations:  Problem # 1:  MORBID OBESITY (ICD-278.01)  Problem # 2:  EDEMA- LOCALIZED (ICD-782.3)  Her updated medication list for this problem includes:    Lasix 40 Mg Tabs (Furosemide) ..... One tab by mouth daily as directed  Problem # 3:  RHINITIS (ICD-477.9)  Extrinsic  Her updated medication list for this problem includes:    Fluticasone Propionate 50 Mcg/act Susp (Fluticasone propionate) .Marland Kitchen... 1 spray two times a day    Benadryl 25 Mg Caps (Diphenhydramine hcl)  Complete Medication List: 1)  [facility] Aspirin 81 Mg Tbec (Aspirin) .... Take 1 tablet by mouth once a day 2)  Amiodarone Hcl 200 Mg Tabs (Amiodarone hcl) .... Take 1 tablet two times a day 3)  Lasix 40 Mg Tabs (Furosemide) .... One tab by mouth daily as directed 4)  Coumadin 5 Mg Tabs (Warfarin sodium) .... Take as directed by coumadin clinic. 5)  Levothyroxine Sodium 200 Mcg Tabs (Levothyroxine sodium) .... Take 1 tablet daily 6)  Tylenol Pm Extra Strength 500-25 Mg Tabs (Diphenhydramine-apap (sleep)) .... As needed 7)  Fish Oil 1000 Mg Caps (Omega-3 fatty acids) .... Take 1 tab  once daily 8)  Multivitamins Tabs (Multiple vitamin) .... Take 1 tablet by mouth once a day 9)  Calcium Carbonate-vitamin D 600-400 Mg-unit Tabs (Calcium carbonate-vitamin d) .Marland Kitchen.. 1 by mouth once daily 10)  Vitamin D3 2000 Unit Caps (Cholecalciferol) .... Take 1 tab once daily 11)  Pepcid 40 Mg Tabs (Famotidine) .... As needed 12)  Arimidex 1 Mg Tabs (Anastrozole) .... Take 1 tab daily 13)  Clonidine Hcl 0.1 Mg Tabs (Clonidine hcl)  .... As needed 14)  Fluticasone Propionate 50 Mcg/act Susp (Fluticasone propionate) .Marland Kitchen.. 1 spray two times a day 15)  Benadryl 25 Mg Caps (Diphenhydramine hcl)  Patient Instructions: 1)  Neti pot once daily until sinuses are clear.Limit your Sodium (Salt) to less than 4 grams a day (slightly less than 1 teaspoon) to prevent fluid retention, swelling, or worsening or symptoms. Portion control as discussed. Brown complex carbs in preference to hyperglycemic white carbs. Prescriptions: FLUTICASONE PROPIONATE 50 MCG/ACT SUSP (FLUTICASONE PROPIONATE) 1 spray two times a day  #1 x 5   Entered and Authorized by:   Marga Melnick MD   Signed by:   Marga Melnick MD on 02/13/2010   Method used:   Faxed to ...       [id number] (retail)       [address]       [place], [place]  [address]       Ph: [phone] or [phone]       Fax: [phone]   RxID:   [id number]

## 2010-10-25 NOTE — Assessment & Plan Note (Signed)
Summary: DISCUSS WEIGHT LOSS/KDC   Vital Signs:  Patient profile:   65 year old female Weight:      273.2 pounds Pulse rate:   72 / minute Resp:     17 per minute BP sitting:   140 / 78  (left arm) Cuff size:   large  Vitals Entered By: Shonna Chock (December 07, 2009 2:53 PM) CC: Discuss weight loss, Abdominal pain Comments REVIEWED MED LIST, PATIENT AGREED DOSE AND INSTRUCTION CORRECT    Primary Care Provider:  Marga Melnick MD  CC:  Discuss weight loss and Abdominal pain.  History of Present Illness: She is planning a "Lap Band" Bariatric Surgery  procedure , but  she must follow Medicare Guidelines which have strict provisions  . Her last Nutition visit was 20 years ago.  PMH of PAF complicated by 2 CVAs. She is on Amiodarone & Coumadin ; she was on Lovenox  for colonoscopy & the breast surgery. Lipids reviewed ; LDL was 128 in 09/2009 off  Crestor. Now on Crestor 20mg  once weekly.                       She has had abd pain post lunch & eve meal X 1 month.No treatment to date. The patient denies nausea, vomiting, diarrhea, constipation, melena, hematochezia, anorexia, and hematemesis.  The location of the pain is diffuse across mid abd.  The pain is described as intermittent and dull.  Associated symptoms include intermittently dark urine ; frank hematuria only after using treadmill. PMH  in [place] of renal calculi;evaluation of hematuria  in   2008 @ [facility] was non diagnostic . Cystoscopy had been done by Dr Vonita Moss here. The patient denies the following symptoms: fever, weight loss, dysuria, chest pain, jaundice, and vaginal bleeding.  The pain is worse with food.  Colonoscopy in 08/2009: Tics.  Allergies: 1)  ! * Dental Anesthesia With Epinephrine  Review of Systems CV:  Denies chest pain or discomfort, difficulty breathing at night, difficulty breathing while lying down, leg cramps with exertion, palpitations, shortness of breath with exertion, swelling of feet, and swelling of  hands; DOE only @ 3 mph on treadmill.No edema since 08/2009, on Lasix as needed .  Physical Exam  General:  in no acute distress; alert,appropriate and cooperative throughout examination Eyes:  No corneal or conjunctival inflammation noted. Perrla.No icterus Mouth:  Oral mucosa and oropharynx without lesions or exudates.  Teeth in good repair. No pharyngeal erythema.   Lungs:  Normal respiratory effort, chest expands symmetrically. Lungs are clear to auscultation, no crackles or wheezes. Heart:  normal rate, regular rhythm, no gallop, no rub, no JVD, no HJR, and grade 1 /6 systolic murmur.   Abdomen:  Bowel sounds positive,abdomen soft and non-tender without masses, organomegaly or hernias noted. Pulses:  R and L carotid,radial,dorsalis pedis and posterior tibial pulses are full and equal bilaterally Extremities:  No clubbing, cyanosis, edema. Skin:  Intact without suspicious lesions or rashes No jaundice Cervical Nodes:  No lymphadenopathy noted Axillary Nodes:  No palpable lymphadenopathy Psych:  memory intact for recent and remote, normally interactive, and good eye contact.     Impression & Recommendations:  Problem # 1:  ABDOMINAL PAIN (ICD-789.00)  Problem # 2:  HEMATURIA (ICD-599.70)  Problem # 3:  MORBID OBESITY (ICD-278.01) considering Bariatric Surgery  Complete Medication List: 1)  [facility] Aspirin 81 Mg Tbec (Aspirin) .... Take 1 tablet by mouth once a day 2)  Amiodarone Hcl 200  Mg Tabs (Amiodarone hcl) .... Take 1 tablet two times a day 3)  Lasix 40 Mg Tabs (Furosemide) .... One tab by mouth dailey,as directed 4)  Coumadin 5 Mg Tabs (Warfarin sodium) .... Take as directed by coumadin clinic. 5)  Levothyroxine Sodium 200 Mcg Tabs (Levothyroxine sodium) .... Take 1 tablet daily 6)  Tylenol Pm Extra Strength 500-25 Mg Tabs (Diphenhydramine-apap (sleep)) .... As needed 7)  Fish Oil 1200 Mg Caps (Omega-3 fatty acids) .... Take 1 capsule by mouth once a day 8)   Multivitamins Tabs (Multiple vitamin) .... Take 1 tablet by mouth once a day 9)  Calcium Carbonate-vitamin D 600-400 Mg-unit Tabs (Calcium carbonate-vitamin d) .Marland Kitchen.. 1 by mouth once daily 10)  Vitamin D3 2000 Unit Caps (Cholecalciferol) .... Take 1 tab once daily 11)  Ranitidine Hcl 150 Mg Caps (Ranitidine hcl) .Marland Kitchen.. 1 two times a day pre  meals; check pt/inr in 1 week  Patient Instructions: 1)  Assess "March Hematuria" response to stationary bike. Prescriptions: RANITIDINE HCL 150 MG CAPS (RANITIDINE HCL) 1 two times a day pre  meals; check PT/INR in 1 week  #60 x 1   Entered and Authorized by:   Marga Melnick MD   Signed by:   Marga Melnick MD on 12/07/2009   Method used:   Faxed to ...       [id number]       [address]       [place], [place]  [address]       Ph: [phone] or [phone]       Fax: [phone]   RxID:   [id number]

## 2010-10-25 NOTE — Assessment & Plan Note (Signed)
Summary: wound on back/cbs   Vital Signs:  Patient profile:   66 year old female Weight:      225.0 pounds Temp:     97.6 degrees F oral Pulse rate:   56 / minute Resp:     16 per minute BP sitting:   120 / 70  (left arm) Cuff size:   large  Vitals Entered By: Shonna Chock CMA (August 22, 2010 3:01 PM) CC: Wound on back    Primary Care Provider:  Marga Melnick MD  CC:  Wound on back .  History of Present Illness:     This is a 65 year old woman who presents with  wound  of  posterior thorax  @ site of "lipoma" which has been manipulated.   The patient reports no injury to the lesion  The patient  reports swelling, redness, tenderness, and increased warmth deformity. Her  girl friend squeezed out yellow  pus  11/26 & green ,  foul smelling ous 11/28 .  Rx: topica alcohol; triple antibiotic  ointment. PMH of non purulent discharge 2-3X over  past 25 years.  Current Medications (verified): 1)  Amiodarone Hcl 200 Mg Tabs (Amiodarone Hcl) .... Take 1 and  1/2 Tablet Once A Day 2)  Lasix 40 Mg Tabs (Furosemide) .... One Tab By Mouth Daily As Directed As Needed 3)  Coumadin 5 Mg Tabs (Warfarin Sodium) .... Take As Directed By Coumadin Clinic. 4)  Levothyroxine Sodium 200 Mcg Tabs (Levothyroxine Sodium) .... Take 1 Tablet Daily Except 1/2 On Monday 5)  Tylenol Pm Extra Strength 500-25 Mg Tabs (Diphenhydramine-Apap (Sleep)) .... As Needed 6)  Multivitamins  Tabs (Multiple Vitamin) .... Take 1 Tablet By Mouth Once A Day 7)  Calcium 500 Mg Tabs (Calcium) .Marland Kitchen.. 1 By Mouth Two Times A Day 8)  Vitamin D3 1000 Unit Tabs (Cholecalciferol) .Marland Kitchen.. 1 By Mouth Once Daily 9)  Arimidex 1 Mg Tabs (Anastrozole) .... Take 1 Tab Daily 10)  Crestor 20 Mg Tabs (Rosuvastatin Calcium) .... Take One Tablet M,w,f At Bedtime  Allergies: 1)  ! * Dental Anesthesia With Epinephrine  Review of Systems General:  Denies chills, fever, and sweats. Derm:  Denies itching and poor wound healing.  Physical  Exam  General:  well-nourished,in no acute distress; alert,appropriate and cooperative throughout examination Skin:  20X 20 mm tender Epidermoid Inclusion Cyst mid thoracic spine. Minimal erythema Cervical Nodes:  No lymphadenopathy noted Axillary Nodes:  No palpable lymphadenopathy   Impression & Recommendations:  Problem # 1:  CELLULITIS/ABSCESS, TRUNK (ICD-682.2)  Her updated medication list for this problem includes:    Amoxicillin-pot Clavulanate 500-125 Mg Tabs (Amoxicillin-pot clavulanate) .Marland Kitchen... 1 every 12 hrs with food  Orders: Prescription Created Electronically [phone]) Surgical Referral (Surgery)  Problem # 2:  EPIDERMOID CYST (ICD-706.2)  infected  Orders: Prescription Created Electronically [phone]) Surgical Referral (Surgery)  Complete Medication List: 1)  Amiodarone Hcl 200 Mg Tabs (Amiodarone hcl) .... Take 1 and  1/2 tablet once a day 2)  Lasix 40 Mg Tabs (Furosemide) .... One tab by mouth daily as directed as needed 3)  Coumadin 5 Mg Tabs (Warfarin sodium) .... Take as directed by coumadin clinic. 4)  Levothyroxine Sodium 200 Mcg Tabs (Levothyroxine sodium) .... Take 1 tablet daily except 1/2 on monday 5)  Tylenol Pm Extra Strength 500-25 Mg Tabs (Diphenhydramine-apap (sleep)) .... As needed 6)  Multivitamins Tabs (Multiple vitamin) .... Take 1 tablet by mouth once a day 7)  Calcium 500 Mg Tabs (Calcium) .Marland KitchenMarland KitchenMarland Kitchen  1 by mouth two times a day 8)  Vitamin D3 1000 Unit Tabs (Cholecalciferol) .Marland Kitchen.. 1 by mouth once daily 9)  Arimidex 1 Mg Tabs (Anastrozole) .... Take 1 tab daily 10)  Crestor 20 Mg Tabs (Rosuvastatin calcium) .... Take one tablet m,w,f at bedtime 11)  Amoxicillin-pot Clavulanate 500-125 Mg Tabs (Amoxicillin-pot clavulanate) .Marland Kitchen.. 1 every 12 hrs with food  Patient Instructions: 1)  Report fever, chills   or progressive pain, redness & swelling as discussed Prescriptions: AMOXICILLIN-POT CLAVULANATE 500-125 MG TABS (AMOXICILLIN-POT CLAVULANATE) 1 every 12  hrs with food  #14 x 0   Entered and Authorized by:   Marga Melnick MD   Signed by:   Marga Melnick MD on 08/22/2010   Method used:   [facility] to ...       [id number]       [address]       [place], [place]  [address]       Ph: [phone] or [phone]       Fax: [phone]   RxID:   [id number]    Orders Added: 1)  Est. Patient Level III [27[address]] 2)  Prescription Created Electronically [phone] 3)  Surgical Referral [Surgery]

## 2010-10-25 NOTE — Assessment & Plan Note (Signed)
Summary: 1 month ov--ph   Vital Signs:  Patient profile:   65 year old female Height:      65.5 inches Weight:      268.6 pounds BMI:     44.18 Pulse rate:   64 / minute Resp:     15 per minute BP sitting:   118 / 70  (left arm) Cuff size:   large  Vitals Entered By: Shonna Chock (March 15, 2010 1:10 PM)  Nutrition Counseling: Patient's BMI is greater than 25 and therefore counseled on weight management options. CC: 1 Month follow-up Comments REVIEWED MED LIST, PATIENT AGREED DOSE AND INSTRUCTION CORRECT    Primary Care Provider:  Marga Melnick MD  CC:  1 Month follow-up.  History of Present Illness: She thought she had lost 4# , but weight essentially unchanged . BMI 44.18. She is following  the pre Bariatric Surgery low carb  modified [employer] from the Nutritionist.Exercise as 45 min  of water aerobics & stationary bike &/ or treadmill 30 min 3X/week w/o symptoms.  Allergies: 1)  ! * Dental Anesthesia With Epinephrine  Review of Systems General:  Denies fatigue and sleep disorder. ENT:  Denies difficulty swallowing and hoarseness. CV:  Denies chest pain or discomfort, leg cramps with exertion, palpitations, and shortness of breath with exertion. Resp:  Denies excessive snoring, hypersomnolence, and morning headaches. GI:  Denies change in bowel habits, constipation, and diarrhea. MS:  Complains of joint pain; denies joint redness and joint swelling; Chronic R knee pain. Derm:  Denies changes in nail beds, dryness, and hair loss. Neuro:  Denies numbness and tingling. Psych:  Denies anxiety, depression, easily angered, easily tearful, and irritability; Psychiatrist seen as per pre op Protocol. Endo:  Denies cold intolerance, excessive hunger, excessive thirst, excessive urination, and heat intolerance; Hot flashes improved 70%.  Physical Exam  General:  in no acute distress; alert,appropriate and cooperative throughout examination; morbidly obese  Neck:  No  deformities, masses, or tenderness noted. Lungs:  Normal respiratory effort, chest expands symmetrically. Lungs are clear to auscultation, no crackles or wheezes. Heart:  normal rate, regular rhythm, no gallop, no rub, no JVD, no HJR, and grade 1.5 raspy  /6 systolic murmur.   Abdomen:  Bowel sounds positive,abdomen soft and non-tender without masses, organomegaly or hernias noted. RUQ& suprapubic op scars Extremities:  trace-1/2+  left  and trace-1/2 +  right pedal edema.   Neurologic:  alert & oriented X3.   Skin:  Intact without suspicious lesions or rashes Psych:  memory intact for recent and remote, normally interactive, good eye contact, not anxious appearing, and not depressed appearing.     Impression & Recommendations:  Problem # 1:  MORBID OBESITY (ICD-278.01) as per Protocol  Problem # 2:  HOT FLASHES (ICD-627.2) improved w/o meds  Problem # 3:  OSTEOARTHRITIS, KNEE, RIGHT (ICD-715.96) improvement anticipated post Bariatric Surgery Her updated medication list for this problem includes:    [facility] Aspirin 81 Mg Tbec (Aspirin) .Marland Kitchen... Take 1 tablet by mouth once a day  Complete Medication List: 1)  [facility] Aspirin 81 Mg Tbec (Aspirin) .... Take 1 tablet by mouth once a day 2)  Amiodarone Hcl 200 Mg Tabs (Amiodarone hcl) .... Take 1 tablet two times a day 3)  Lasix 40 Mg Tabs (Furosemide) .... One tab by mouth daily as directed 4)  Coumadin 5 Mg Tabs (Warfarin sodium) .... Take as directed by coumadin clinic. 5)  Levothyroxine Sodium 200 Mcg Tabs (Levothyroxine sodium) .Marland KitchenMarland KitchenMarland Kitchen  Take 1 tablet daily 6)  Tylenol Pm Extra Strength 500-25 Mg Tabs (Diphenhydramine-apap (sleep)) .... As needed 7)  Fish Oil 1000 Mg Caps (Omega-3 fatty acids) .... Take 1 tab once daily 8)  Multivitamins Tabs (Multiple vitamin) .... Take 1 tablet by mouth once a day 9)  Calcium Carbonate-vitamin D 600-400 Mg-unit Tabs (Calcium carbonate-vitamin d) .Marland Kitchen.. 1 by mouth once daily 10)  Vitamin D3 2000 Unit Caps  (Cholecalciferol) .... Take 1 tab once daily 11)  Pepcid 40 Mg Tabs (Famotidine) .... As needed 12)  Arimidex 1 Mg Tabs (Anastrozole) .... Take 1 tab daily 13)  Clonidine Hcl 0.1 Mg Tabs (Clonidine hcl) .... As needed 14)  Benadryl 25 Mg Caps (Diphenhydramine hcl)  Patient Instructions: 1)  Share record with Protocol Nurse. Continue low carb diet.Please schedule a follow-up appointment in 1 month.

## 2010-10-25 NOTE — Progress Notes (Signed)
Summary: Refill Request  Phone Note Refill Request Message from:  [facility] Fax #: [phone]  Refills Requested: Medication #1:  FAMOTIDINE 40MG  TABLET, TAKE 1 TABLET BY MOUTH EVERYDAY   Dosage confirmed as above?Dosage Confirmed   Brand Name Necessary? No   Supply Requested: 1 month   Last Refilled: 12/13/2009 Next Appointment Scheduled: none Initial call taken by: Harold Barban,  January 11, 2010 8:34 AM    Prescriptions: PEPCID 40 MG TABS (FAMOTIDINE) Take 1 tab once daily  #30 x 5   Entered by:   Shonna Chock   Authorized by:   Marga Melnick MD   Signed by:   Shonna Chock on 01/11/2010   Method used:   [facility]ically to        [facility] [phone][facility] (retail)       [address]       [place], [place]  [address]       Ph: [phone]       Fax: [phone]   RxID:   [id number]

## 2010-10-25 NOTE — Medication Information (Signed)
Summary: rov/sl  Anticoagulant Therapy  Managed by: Reina Fuse, PharmD Referring MD: Sherryl Manges MD PCP: Marga Melnick MD Supervising MD: Jens Som MD, Arlys John Indication 1: Atrial Fibrillation (ICD-427.31) Lab Used: [facility] Site: [facility] INR POC 2.8 INR RANGE 2 - 3  Dietary changes: no    Health status changes: no    Bleeding/hemorrhagic complications: no    Recent/future hospitalizations: no    Any changes in medication regimen? no    Recent/future dental: no  Any missed doses?: no       Is patient compliant with meds? yes      Comments: Lap band surgery schedule for 10/18.  Current Medications (verified): 1)  [facility] Aspirin 81 Mg Tbec (Aspirin) .... Take 1 Tablet By Mouth Once A Day 2)  Amiodarone Hcl 200 Mg Tabs (Amiodarone Hcl) .... Take 1 Tablet Two Times A Day 3)  Lasix 40 Mg Tabs (Furosemide) .... One Tab By Mouth Daily As Directed 4)  Coumadin 5 Mg Tabs (Warfarin Sodium) .... Take As Directed By Coumadin Clinic. 5)  Levothyroxine Sodium 200 Mcg Tabs (Levothyroxine Sodium) .... Take 1 Tablet Daily 6)  Tylenol Pm Extra Strength 500-25 Mg Tabs (Diphenhydramine-Apap (Sleep)) .... As Needed 7)  Multivitamins  Tabs (Multiple Vitamin) .... Take 1 Tablet By Mouth Once A Day 8)  Calcium 500 Mg Tabs (Calcium) .Marland Kitchen.. 1 By Mouth Once Daily 9)  Vitamin D3 1000 Unit Tabs (Cholecalciferol) .Marland Kitchen.. 1 By Mouth Once Daily 10)  Arimidex 1 Mg Tabs (Anastrozole) .... Take 1 Tab Daily 11)  Biotin 5000 Mcg Caps (Biotin) .Marland Kitchen.. 1 By Mouth Once Daily 12)  Crestor 20 Mg Tabs (Rosuvastatin Calcium) .... Take One Tablet M,w,f At Bedtime  Allergies (verified): 1)  ! * Dental Anesthesia With Epinephrine  Anticoagulation Management History:      The patient is taking warfarin and comes in today for a routine follow up visit.  Positive risk factors for bleeding include an age of 67 years or older and history of CVA/TIA.  The bleeding index is 'intermediate risk'.  Positive CHADS2  values include History of CHF and Prior Stroke/CVA/TIA.  Negative CHADS2 values include Age > 67 years old.  The start date was 07/25/2003.  Her last INR was 2.7.  Anticoagulation responsible Kristina Mcnorton: Jens Som MD, Arlys John.  INR POC: 2.8.  Cuvette Lot#: [id number].  Exp: 07/2011.    Anticoagulation Management Assessment/Plan:      The patient's current anticoagulation dose is Coumadin 5 mg tabs: Take as directed by coumadin clinic..  The target INR is 2 - 3.  The next INR is due 07/10/2010.  Anticoagulation instructions were given to patient.  Results were reviewed/authorized by Reina Fuse, PharmD.  She was notified by Reina Fuse PharmD.         Prior Anticoagulation Instructions: INR 2.2  Continue on same dosage 1/2 tablet daily and 1 tablet on Mondays and Fridays.  Recheck in 4 weeks.    Current Anticoagulation Instructions: INR 2.8  Continue taking Coumadin 1 tab (5 mg) on Mon and Fri and Coumadin 0.5 tab (2.5 mg) on all other days. Will call to f/u about bridge therapy for procedure on 10/18.

## 2010-10-25 NOTE — Assessment & Plan Note (Signed)
Summary: headache /fu weight loss/cbs   Vital Signs:  Patient profile:   65 year old female Weight:      262 pounds Temp:     98.2 degrees F oral Pulse rate:   54 / minute Resp:     17 per minute BP sitting:   130 / 70  (left arm)  Vitals Entered By: Jeremy Johann CMA (January 16, 2010 3:07 PM) CC: Headache, recent weight loss Comments REVIEWED MED LIST, PATIENT AGREED DOSE AND INSTRUCTION CORRECT    Primary Care Provider:  Marga Melnick MD  CC:  Headache and recent weight loss.  History of Present Illness: She can not sleep due hot flashes due to Arimidex x 6 weeks; Dr Welton Flakes asked her to monitor the severity of the sweats. She has lost 11# with CVE  75 minutes 3X/week & decreased intake of  glycemic carbs.  With sleep deprivation she has R temple headaches < 2 seconds as "jolts" for 4-5 days intermittently.Rx: Tylenol .  Headcahes better today.                                                                                                                  Bariatric guidelines for Medicare reviewed .  Allergies: 1)  ! * Dental Anesthesia With Epinephrine  Review of Systems Neuro:  Denies brief paralysis, falling down, numbness, tingling, and weakness; Some imbalance .  Physical Exam  General:  well-nourished,in no acute distress; alert,appropriate and cooperative throughout examination Eyes:  No corneal or conjunctival inflammation noted. EOMI. Perrla. Field of  Vision grossly normal. Mouth:  Oral mucosa and oropharynx without lesions or exudates.  Tongue w/o deviation Heart:  Normal rate and regular rhythm. S1 and S2 normal without gallop, click, rub .S4.grade 1 /6 systolic murmur.   Pulses:  R and L carotid  pulses are full and equal bilaterallyw/o bruits  Neurologic:  alert & oriented X3, cranial nerves II-XII intact, strength normal in all extremities, and DTRs symmetrical and normal.     Impression & Recommendations:  Problem # 1:  HOT FLASHES  (ICD-627.2)  Orders: Prescription Created Electronically [phone])  Problem # 2:  HEADACHE (ICD-784.0)  Her updated medication list for this problem includes:    [facility] Aspirin 81 Mg Tbec (Aspirin) .Marland Kitchen... Take 1 tablet by mouth once a day  Complete Medication List: 1)  [facility] Aspirin 81 Mg Tbec (Aspirin) .... Take 1 tablet by mouth once a day 2)  Amiodarone Hcl 200 Mg Tabs (Amiodarone hcl) .... Take 1 tablet two times a day 3)  Lasix 40 Mg Tabs (Furosemide) .... One tab by mouth dailey,as directed 4)  Coumadin 5 Mg Tabs (Warfarin sodium) .... Take as directed by coumadin clinic. 5)  Levothyroxine Sodium 200 Mcg Tabs (Levothyroxine sodium) .... Take 1 tablet daily 6)  Tylenol Pm Extra Strength 500-25 Mg Tabs (Diphenhydramine-apap (sleep)) .... As needed 7)  Fish Oil 1000 Mg Caps (Omega-3 fatty acids) .... Take 1 tab once daily 8)  Multivitamins Tabs (Multiple vitamin) .Marland KitchenMarland KitchenMarland Kitchen  Take 1 tablet by mouth once a day 9)  Calcium Carbonate-vitamin D 600-400 Mg-unit Tabs (Calcium carbonate-vitamin d) .Marland Kitchen.. 1 by mouth once daily 10)  Vitamin D3 2000 Unit Caps (Cholecalciferol) .... Take 1 tab once daily 11)  Pepcid 40 Mg Tabs (Famotidine) .... Take 1 tab once daily 12)  Arimidex 1 Mg Tabs (Anastrozole) .... Take 1 tab daily 13)  Clonidine Hcl 0.1 Mg Tabs (Clonidine hcl) .Marland Kitchen.. 1 at bedtime  Patient Instructions: 1)  Keep a Headache Diary as discussed Prescriptions: CLONIDINE HCL 0.1 MG TABS (CLONIDINE HCL) 1 at bedtime  #30 x 5   Entered and Authorized by:   Marga Melnick MD   Signed by:   Marga Melnick MD on 01/16/2010   Method used:   [facility] ...       [id number]       [address]       [place], [place]  [address]       Ph: [phone] or [phone]       Fax: [phone]   RxID:   [id number]

## 2010-10-25 NOTE — Letter (Signed)
[facility]   Imported By: Lanelle Bal 01/08/2010 10:03:24  _____________________________________________________________________  External Attachment:    Type:   Image     Comment:   External Document

## 2010-10-25 NOTE — Progress Notes (Signed)
Summary: Crestor Concerns  ---- Converted from flag ---- ---- 10/12/2009 7:16 AM, Marga Melnick MD wrote: restart as 1 pill every Weds & recheck fasting labs after 10 weeks (lipids, hep panel, CPK; 272.4, 995.20)  ---- 10/10/2009 9:33 AM, Lemont Fillers FNP wrote: Patient wants to know what to do about her crestor that she stopped please ------------------------------  Phone Note Outgoing Call   Summary of Call: Please call [name] and advise her to start crestor once weekly on wednesdays and have her return for FLP, CPK, LFT's in 10 weeks 272.4 and 995.20, thanks Initial call taken by: Lemont Fillers FNP,  October 12, 2009 8:53 PM  Follow-up for Phone Call        Left message on VM informing patient to call the office to schedule an appointment in 10weeks fasting labs and to restart Crestor 1 by mouth weekly Follow-up by: Shonna Chock,  October 13, 2009 2:13 PM

## 2010-10-25 NOTE — Assessment & Plan Note (Signed)
Summary: discuss [facility] results and med ///sph   Vital Signs:  Patient profile:   65 year old female Weight:      212.0 pounds BMI:     34.34 Pulse rate:   60 / minute Resp:     15 per minute BP sitting:   118 / 74  (left arm) Cuff size:   large  Vitals Entered By: Shonna Chock CMA (October 08, 2010 2:52 PM) CC: Discuss [facility] , Lipid Management   Primary Care Provider:  Marga Melnick MD  CC:  Discuss [facility]  and Lipid Management.  History of Present Illness: Hyperlipidemia Follow-Up      This is a 65 year old woman who presents for Hyperlipidemia follow-up.  [facility] Panel reviewed & risks discussed. The patient denies the following symptoms: chest pain/pressure, exercise intolerance, dypsnea, palpitations, syncope, and pedal edema.  Dietary compliance has been excellent.  The patient reports exercising 3 X per week for 145 minutes. Adjunctive measures currently used by the patient include ASA.    Lipid Management History:      Positive NCEP/ATP III risk factors include female age 67 years old or older or older and prior stroke (or TIA).  Negative NCEP/ATP III risk factors include no history of early menopause without estrogen hormone replacement, non-diabetic, no family history for ischemic heart disease, non-tobacco-user status, non-hypertensive, no ASHD (atherosclerotic heart disease), no peripheral vascular disease, and no history of aortic aneurysm.     Current Medications (verified): 1)  Amiodarone Hcl 200 Mg Tabs (Amiodarone Hcl) .... Once Daily 2)  Lasix 40 Mg Tabs (Furosemide) .... One Tab By Mouth Daily As Directed As Needed 3)  Coumadin 5 Mg Tabs (Warfarin Sodium) .... Take As Directed By Coumadin Clinic. 4)  Levothyroxine Sodium 200 Mcg Tabs (Levothyroxine Sodium) .... Take 1 Tablet Daily Except 1/2 On Monday 5)  Tylenol Pm Extra Strength 500-25 Mg Tabs (Diphenhydramine-Apap (Sleep)) .... As Needed 6)  Multivitamins  Tabs (Multiple Vitamin) ....  Take 1 Tablet By Mouth Once A Day 7)  Calcium 500 Mg Tabs (Calcium) .Marland Kitchen.. 1 By Mouth Two Times A Day 8)  Vitamin D3 1000 Unit Tabs (Cholecalciferol) .Marland Kitchen.. 1 By Mouth Once Daily 9)  Arimidex 1 Mg Tabs (Anastrozole) .... Take 1 Tab Daily 10)  Aspirin 81 Mg Tbec (Aspirin) .... Take One Tablet By Mouth Daily  Allergies: 1)  ! * Dental Anesthesia With Epinephrine  Past History:  Past Medical History: Atrial fibrillation -S/P  ablation done at [facility]. CVA X2-3 while in [place] 2004 ; not on coumadin Dyslipidemia: Note CVA was embolic  History of thyroid cancer Breast cancer 07/23/2009 Colonic polyps, hx of  premalignant in 1992, benign polyps since  Physical Exam  General:  well-nourished;alert,appropriate and cooperative throughout examination Heart:  Normal rate and regular rhythm. S1 and S2 normal without gallop, murmur, click, rub .S4 Pulses:  R and L carotid,radial,dorsalis pedis and posterior tibial pulses are full and equal bilaterally Neurologic:  alert & oriented X3.   Psych:  Focused & intelligent   Impression & Recommendations:  Problem # 1:  HYPERLIPIDEMIA (ICD-272.2)  The following medications were removed from the medication list:    Crestor 20 Mg Tabs (Rosuvastatin calcium) .Marland Kitchen... Take one tablet m,w,f at bedtime Her updated medication list for this problem includes:    Pravastatin Sodium 20 Mg Tabs (Pravastatin sodium) .Marland Kitchen... 1 at bedtime  Orders: Prescription Created Electronically [phone])  Complete Medication List: 1)  Amiodarone Hcl 200 Mg  Tabs (Amiodarone hcl) .... Once daily 2)  Lasix 40 Mg Tabs (Furosemide) .... One tab by mouth daily as directed as needed 3)  Coumadin 5 Mg Tabs (Warfarin sodium) .... Take as directed by coumadin clinic. 4)  Levothyroxine Sodium 200 Mcg Tabs (Levothyroxine sodium) .... Take 1 tablet daily except 1/2 on monday 5)  Tylenol Pm Extra Strength 500-25 Mg Tabs (Diphenhydramine-apap (sleep)) .... As needed 6)   Multivitamins Tabs (Multiple vitamin) .... Take 1 tablet by mouth once a day 7)  Calcium 500 Mg Tabs (Calcium) .Marland Kitchen.. 1 by mouth two times a day 8)  Vitamin D3 1000 Unit Tabs (Cholecalciferol) .Marland Kitchen.. 1 by mouth once daily 9)  Arimidex 1 Mg Tabs (Anastrozole) .... Take 1 tab daily 10)  Aspirin 81 Mg Tbec (Aspirin) .... Take one tablet by mouth daily 11)  Pravastatin Sodium 20 Mg Tabs (Pravastatin sodium) .Marland Kitchen.. 1 at bedtime  Lipid Assessment/Plan:      Based on NCEP/ATP III, the patient's risk factor category is "history of coronary disease, peripheral vascular disease, cerebrovascular disease, or aortic aneurysm".  The patient's lipid goals are as follows: Total cholesterol goal is 200; LDL cholesterol goal is 100; HDL cholesterol goal is 40; Triglyceride goal is 150.    Patient Instructions: 1)  Please take the lowest dose of an OTC Slow Release Niacin with apple sauce after eve meal. Please  schedule a follow-up appointment in 3 months. 2)  Hepatic Panel prior to visit, ICD-9:995.20 3)  Lipid Panel prior to visit, ICD-9:272.4 Prescriptions: PRAVASTATIN SODIUM 20 MG TABS (PRAVASTATIN SODIUM) 1 at bedtime  #30 x 2   Entered and Authorized by:   Marga Melnick MD   Signed by:   Marga Melnick MD on 10/08/2010   Method used:   [facility] to        [facility] [phone]* (retail)       [address]       [place], [place]  [address]       Ph: [phone]       Fax: [phone]   RxID:   [id number]    Orders Added: 1)  Est. Patient Level III [27[address]] 2)  Prescription Created Electronically [phone]

## 2010-10-25 NOTE — Assessment & Plan Note (Signed)
Summary: per check out/sf   Primary Provider:  Marga Melnick MD  CC:  fatigue beyond normal.  Pt SOB.  Thought she was in flutter.  Pt has finished radiation for Breast Cancer.  She is not sure now why she is so exhausted.   pt cannot even work out due to fatigue.  Arimedex makes pt lose sleep.Marland Kitchen  History of Present Illness: Kayla Harrison is seen in followup for atrial fibrillation that is paroxysmal.  She has a largely holding sinus rhythm. She did have "an arrhythmic day" on Easter Sunday. She characterized this not as stated not as flutter not fast but more like flutter.  She saw Dr. Alwyn Ren the next day because of fatigue and headaches. He thought this was related to lack of sleep. He prescribed clonidine which helped with her sleep and indeed her headaches resolved.  She comes in today for routine followup; however, she is acutely short of breath. There is some positional dependence of this. It is markedly worse with even mild exertion. She notes peripheral edema today for the first time in months. I should note that she has been taking her diuretics p.r.n.  On Easter Sunday she felt terrible. She thought at that time she might be having a heart attack. She denies intercurrent chest pains echo cardiogram in the fall of 09 demonstrated  -  Overall left ventricular systolic function was normal. Left       ventricular ejection fraction was estimated , range being 55       % to 60 %.  She is also completed her breast cancer therapies apart from hormonal ongoing chemotherapy. She underwent surgery and radiation.     Current Medications (verified): 1)  [facility] Aspirin 81 Mg Tbec (Aspirin) .... Take 1 Tablet By Mouth Once A Day 2)  Amiodarone Hcl 200 Mg Tabs (Amiodarone Hcl) .... Take 1 Tablet Two Times A Day 3)  Lasix 40 Mg Tabs (Furosemide) .... One Tab By Mouth Daily As Directed 4)  Coumadin 5 Mg Tabs (Warfarin Sodium) .... Take As Directed By Coumadin Clinic. 5)  Levothyroxine Sodium  200 Mcg Tabs (Levothyroxine Sodium) .... Take 1 Tablet Daily 6)  Tylenol Pm Extra Strength 500-25 Mg Tabs (Diphenhydramine-Apap (Sleep)) .... As Needed 7)  Fish Oil 1000 Mg Caps (Omega-3 Fatty Acids) .... Take 1 Tab Once Daily 8)  Multivitamins  Tabs (Multiple Vitamin) .... Take 1 Tablet By Mouth Once A Day 9)  Calcium Carbonate-Vitamin D 600-400 Mg-Unit  Tabs (Calcium Carbonate-Vitamin D) .Marland Kitchen.. 1 By Mouth Once Daily 10)  Vitamin D3 2000 Unit Caps (Cholecalciferol) .... Take 1 Tab Once Daily 11)  Pepcid 40 Mg Tabs (Famotidine) .... Take 1 Tab Once Daily 12)  Arimidex 1 Mg Tabs (Anastrozole) .... Take 1 Tab Daily 13)  Clonidine Hcl 0.1 Mg Tabs (Clonidine Hcl) .Marland Kitchen.. 1 At Bedtime  Allergies (verified): 1)  ! * Dental Anesthesia With Epinephrine  Past History:  Past Medical History: Last updated: 07/25/2009 Atrial fibrillation-recent ablation done at [facility]. CVA X2-3 while in [place] 2004 ; not on coumadin dyslipidemia history of thyroid cancer Breast cancer 07/23/2009 Colonic polyps, hx of  premalignant in 1992, benign polyps since  Past Surgical History: Last updated: 07/25/2009 Thyroidectomy for CA 1998  Dr Jamey Ripa; Dr Evlyn Kanner; post op RAI X 2 Benign ovarian tumor 1998; [facility]  Dr Nicholas Lose Hysterectomy 1983 fibroids & endometriosis Cholecystectomy Tonsillectomy Colon polypectomy 1992 , pre malignant; last colonoscopy 2006 : neg  Family History: Last updated: 09/22/2007  Colon Cancer  Social History: Last updated: 07/25/2009 the patient is a single, and has no children.  She has never smoked. Excecutive, Corporate investment banker for [employer], atteemptiing to retire; on carb restriction Regular exercise-yes; 3X/week as water aerobics & walking, 2 days of senior exercise w/o symptoms  Vital Signs:  Patient profile:   65 year old female Height:      66 inches Weight:      275 pounds BMI:     44.55 Pulse rate:   60 / minute Pulse rhythm:   regular BP sitting:   122 /  70  (left arm) Cuff size:   large  Vitals Entered By: Judithe Modest CMA (Jan 24, 2010 2:11 PM)  Physical Exam  General:  The patient was alert and oriented in no acute distress. HEENT Normal.  Neck veins were 7-8 cm with hepatojugular reflux, carotids were brisk.  Lungs were clear.  Heart sounds were regular without murmurs or gallops.  Abdomen was soft with active bowel sounds. There is no clubbing cyanosis1-2+ peripheral edema Skin Warm and dry    EKG  Procedure date:  01/24/2010  Findings:      sinus rhythm at 60 Intervals 0.21/0.10/0.47 Axis is 16 RSR prime  Impression & Recommendations:  Problem # 1:  ACUTE ON CHRONIC DIASTOLIC HEART FAILURE (ICD-428.33) The patient presents with symptoms of shortness of breath,  dyspnea on exertion dyspnea and peripheral edema.  I suspect this represents acute on chronic diastolic heart failure.  Chest x-ray is supportive of this diagnosis.  The patient had not been taking her diuretics. We will resumje them and and she is to let us know over the next couple of days how she is feeling. We will further plan to check a BNP. She had episode of chest pain that preceded this will check a troponin to make sure that this did not represent an acute myocardial infarction which I do not think it does as electrocardiogram is unchanged;  I dont think based on the acuity of the onset that this represents amiodarone lung toxicity.  We will continue amio at the current dose. Her updated medication list for this problem includes:    [facility] Aspirin 81 Mg Tbec (Aspirin) .Marland Kitchen... Take 1 tablet by mouth once a day    Amiodarone Hcl 200 Mg Tabs (Amiodarone hcl) .Marland Kitchen... Take 1 tablet two times a day    Lasix 40 Mg Tabs (Furosemide) ..... One tab by mouth daily as directed    Coumadin 5 Mg Tabs (Warfarin sodium) .Marland Kitchen... Take as directed by coumadin clinic.  Problem # 2:  SHORTNESS OF BREATH (ICD-786.05) see the above Her updated medication list for this problem  includes:    [facility] Aspirin 81 Mg Tbec (Aspirin) .Marland Kitchen... Take 1 tablet by mouth once a day    Lasix 40 Mg Tabs (Furosemide) ..... One tab by mouth daily as directed  Orders: TLB-BNP (B-Natriuretic Peptide) (83880-BNPR) T-2 View CXR (71020TC) T-Troponin I (29562-13086)  Problem # 3:  ATRIAL FIBRILLATION (ICD-427.31) appears to be holding sinus rhythm.  will continue amiodarone at current dose Her updated medication list for this problem includes:    [facility]s Aspirin 81 Mg Tbec (Aspirin) .Marland Kitchen... Take 1 tablet by mouth once a day    Amiodarone Hcl 200 Mg Tabs (Amiodarone hcl) .Marland Kitchen... Take 1 tablet two times a day    Coumadin 5 Mg Tabs (Warfarin sodium) .Marland Kitchen... Take as directed by coumadin clinic.  Orders: EKG w/ Interpretation (  93000) 

## 2010-10-25 NOTE — Progress Notes (Signed)
Summary: Off Coumadin for Procedure   ---- Converted from flag ---- ---- 07/03/2010 8:50 AM, Nathen May, MD, [facility] wrote: She has had two prior strokes and think she needs to be bridged  She also needs an echo  ---- 07/02/2010 3:53 PM, Weston Brass PharmD wrote: Pt having gastric bypass on 10/18.  Needs to hold Coumadin 5 days prior.  Please advise.  Thanks ------------------------------  Phone Note Outgoing Call   Call placed by: Weston Brass PharmD,  July 03, 2010 10:27 AM Call placed to: Patient Summary of Call: Spoke with pt.  Informed her Dr. Graciela Husbands would like her to bridge with Lovenox prior to procedure.  She is concerned over cost with Lovenox so will call pharmacy to verify costs.  Dr. Graciela Husbands also wants pt  to have echo prior to procedure.  This has been scheduled for today at 12:15.  Pt weight- 250lb, SCr- 1.5 (11/10)  Lovenox instructions:  10/13-      Appended Document: Off Coumadin for Procedure  Spoke with pt's pharmacy.  Pt's cost for generic enoxaparin is  ~$220.  She is agreeable to pay this.    10/13- Take last dose of Coumadin 10/14- No Coumadin or Lovenox 10/15- Start Lovenox 100mg  injection in the abdomen 2 times a day (Take first dose in AM) 10/16- Take 2 doses of Lovenox 10/17- Take 1 dose of Lovenox in AM.  Do not take any Lovenox in the PM 10/18- Day of Procedure.  Surgeon will restart Coumadin and Lovenox when appropriate.   After discharge, take Coumadin 1 1/2 tablets (7.5mg ) until your appt with Coumadin Clinic.  Continue Lovenox 100mg  twice a day.     Coumadin Clinic Appt: 10/21

## 2010-10-25 NOTE — Letter (Signed)
[facility]   Imported By: Lanelle Bal 10/19/2009 15:47:55  _____________________________________________________________________  External Attachment:    Type:   Image     Comment:   External Document

## 2010-10-25 NOTE — Progress Notes (Signed)
Summary: ? med change  Phone Note From Pharmacy   Caller: [facility] [address] Summary of Call: pharmacist states that if dr Damyon Mullane is worried about pt PT/INR level that he should try pt on pepcid vs the RANITIDINE HCL 150 MG CAP. pls advise...............Marland KitchenFelecia Deloach CMA  December 13, 2009 12:04 PM   Follow-up for Phone Call        Pepcid 40 mg once daily #30 Follow-up by: Marga Melnick MD,  December 13, 2009 2:11 PM  Additional Follow-up for Phone Call Additional follow up Details #1::        pt aware of med change rx sent to pharmacy........Marland KitchenFelecia Deloach CMA  December 13, 2009 2:29 PM     New/Updated Medications: PEPCID 40 MG TABS (FAMOTIDINE) Take 1 tab once daily Prescriptions: PEPCID 40 MG TABS (FAMOTIDINE) Take 1 tab once daily  #30 x 0   Entered by:   Jeremy Johann CMA   Authorized by:   Marga Melnick MD   Signed by:   Jeremy Johann CMA on 12/13/2009   Method used:   Faxed to ...       [id number] (retail)       [address]       [place], [place]  [address]       Ph: [phone] or [phone]       Fax: [phone]   RxID:   [id number]

## 2010-10-25 NOTE — Medication Information (Signed)
Summary: rov/cs  Anticoagulant Therapy  Managed by: Weston Brass, PharmD Referring MD: Sherryl Manges MD PCP: Marga Melnick MD Supervising MD: Daleen Squibb MD, Maisie Fus Indication 1: Atrial Fibrillation (ICD-427.31) Lab Used: [facility] Site: [facility] INR POC 4.5 INR RANGE 2 - 3  Dietary changes: yes       Details: Low fat protein (egg, ham & beef) 2/2 surgery  Health status changes: no    Bleeding/hemorrhagic complications: yes       Details: Brusing from surgery 2 (lap band & hital hernia repair) weeks ago  Recent/future hospitalizations: no    Any changes in medication regimen? no    Recent/future dental: no  Any missed doses?: no       Is patient compliant with meds? yes       Allergies: 1)  ! * Dental Anesthesia With Epinephrine  Anticoagulation Management History:      The patient is taking warfarin and comes in today for a routine follow up visit.  Positive risk factors for bleeding include an age of 67 years or older and history of CVA/TIA.  The bleeding index is 'intermediate risk'.  Positive CHADS2 values include History of CHF and Prior Stroke/CVA/TIA.  Negative CHADS2 values include Age > 67 years old.  The start date was 07/25/2003.  Her last INR was 1.3 ratio.  Anticoagulation responsible provider: Daleen Squibb MD, Maisie Fus.  INR POC: 4.5.  Cuvette Lot#: [id number].  Exp: 08/2011.    Anticoagulation Management Assessment/Plan:      The patient's current anticoagulation dose is Coumadin 5 mg tabs: Take as directed by coumadin clinic..  The target INR is 2 - 3.  The next INR is due 08/09/2010.  Anticoagulation instructions were given to patient.  Results were reviewed/authorized by Weston Brass, PharmD.  She was notified by Hoy Register, PharmD Candidate.         Prior Anticoagulation Instructions: INR  3.7  Skip today's dose, and take 1/2 tablet on Friday.  Then resume Coumadin schedule of 1 tablet on Monday, and 1/2 tablet all other days.  Return to clinic on November 3rd.     Current Anticoagulation Instructions: INR 4.5  Hold dose for today and tomorrow then take 2.5 mg everyday.  Recheck INR in 2 weeks.

## 2010-10-25 NOTE — Letter (Signed)
Summary: [facility]   Imported By: Lanelle Bal 10/24/2009 07:57:53  _____________________________________________________________________  External Attachment:    Type:   Image     Comment:   External Document

## 2010-10-25 NOTE — Assessment & Plan Note (Signed)
Summary: rov appt. at 2pm/sl   Visit Type:  rov Primary Provider:  Marga Melnick MD  CC:  sob......denies any other complaints today.  History of Present Illness:   Kayla Harrison is seen in followup for atrial fibrillation  cx by prior CVA status post ablation at [facility] on Coumadin and amiodarone therapy as well as diastolic heart failure    Her most recent echocardiogram done in October 2011 demonstrates an ejection fraction of 55-60% and mild left atrial enlargement.    She nderwent lao band surgery and her post op course was complicated by episodes of near syncope .  These episodes are always preceded by low -mid back pain.  She has looked at herself in the mirror and has noted extereme pallor and diaphoresis.  WHen she saw Dr  Alwyn Ren her BP was 95  She has had some orthosatatic intolerance    D dimer was eleveated  However, her chest CT was negative for pulmonary embolism.  She has had no AF on amio for the last two years      Current Medications (verified): 1)  Amiodarone Hcl 200 Mg Tabs (Amiodarone Hcl) .... Take 1 and  1/2 Tablet Once A Day 2)  Lasix 40 Mg Tabs (Furosemide) .... One Tab By Mouth Daily As Directed As Needed 3)  Coumadin 5 Mg Tabs (Warfarin Sodium) .... Take As Directed By Coumadin Clinic. 4)  Levothyroxine Sodium 200 Mcg Tabs (Levothyroxine Sodium) .... Take 1 Tablet Daily Except 1/2 On Monday 5)  Tylenol Pm Extra Strength 500-25 Mg Tabs (Diphenhydramine-Apap (Sleep)) .... As Needed 6)  Multivitamins  Tabs (Multiple Vitamin) .... Take 1 Tablet By Mouth Once A Day 7)  Calcium 500 Mg Tabs (Calcium) .Marland Kitchen.. 1 By Mouth Two Times A Day 8)  Vitamin D3 1000 Unit Tabs (Cholecalciferol) .Marland Kitchen.. 1 By Mouth Once Daily 9)  Arimidex 1 Mg Tabs (Anastrozole) .... Take 1 Tab Daily 10)  Crestor 20 Mg Tabs (Rosuvastatin Calcium) .... Take One Tablet M,w,f At Bedtime 11)  Aspirin 81 Mg Tbec (Aspirin) .... Take One Tablet By Mouth Daily  Allergies: 1)  ! * Dental Anesthesia  With Epinephrine  Past History:  Past Medical History: Last updated: 08/06/2010 Atrial fibrillation - s/p ablation done at [facility]. CVA X2-3 while in [place] 2004 ; not on coumadin Dyslipidemia History of thyroid cancer Breast cancer 07/23/2009 Colonic polyps, hx of  premalignant in 1992, benign polyps since  Vital Signs:  Patient profile:   66 year old female Height:      66 inches Weight:      224.75 pounds BMI:     36.41 Pulse rate:   62 / minute Pulse (ortho):   67 / minute Pulse rhythm:   regular BP sitting:   118 / 70  (left arm) BP standing:   129 / 73 Cuff size:   large  Vitals Entered By: Danielle Rankin, CMA (August 30, 2010 1:49 PM)  Serial Vital Signs/Assessments:  Time      Position  BP       Pulse  Resp  Temp     By 2:25 PM   Lying LA  128/73   60                    Danielle Rankin, CMA 2:26 PM   Sitting   138/70   60                    Okey Regal  Denyse Amass, CMA 2:27 PM   Standing  129/73   [address] [place] 2:30 PM   Standing  129/78   [address] [place] 2:32 PM   Standing  128/75   67                    Danielle Rankin, [place]  Comments: 2:25 PM no sxms By: Danielle Rankin, CMA  2:26 PM pt states she feels wobbly and states she feels like vision wants to 'narrow in' By: Danielle Rankin, CMA  2:27 PM pt states she feels like her vision is not as sharp. By: Danielle Rankin, CMA  2:30 PM still feels shaky By: Danielle Rankin, CMA  2:32 PM pt states her vision is better this time, still shaky and wobbly By: Danielle Rankin, CMA    Physical Exam  General:  The patient was alert and oriented in no acute distress. HEENT Normal.  Neck veins were flat, carotids were brisk.  Lungs were clear.  Heart sounds were regular without murmurs or gallops.  Abdomen was soft with active bowel sounds. There is no clubbing cyanosis or edema. Skin Warm and dry    Impression & Recommendations:  Problem # 1:  SYNCOPE AND COLLAPSE  (ICD-780.2) I suspect these episodes of weakness and lightheadedness are neurally mediated. The pain in her back may be serving as the trigger or in accompaniment. It is my impression that obesity reduction surgery and manipulation of the viscus is associated in some patients with dysautonomia. The accompanying epiphenomena are at least consistent with this hypothesis. Amiodarone in some patients is also in my experience but associated with autonomic neuropathy and that may be aggravating things.  She's also noted some problems with her right foot dragging. This raises the possibility of intercurrent CVA. We will need to get her reevaluated by neurology. We will plan to get an MRI in anticipation of that visit. Her updated medication list for this problem includes:    Amiodarone Hcl 200 Mg Tabs (Amiodarone hcl) ..... Once daily    Coumadin 5 Mg Tabs (Warfarin sodium) .Marland Kitchen... Take as directed by coumadin clinic.    Aspirin 81 Mg Tbec (Aspirin) .Marland Kitchen... Take one tablet by mouth daily  Problem # 2:  ATRIAL FIBRILLATION (ICD-427.31) as above;  we will reduce her amiodarone to 200 mg a day.It could be potentially containing both autonomic symptoms as well as some balance and gait issues.    Amiodarone Hcl 200 Mg Tabs (Amiodarone hcl) .Marland Kitchen... Take 1 and  1/2 tablet once a day    Coumadin 5 Mg Tabs (Warfarin sodium) .Marland Kitchen... Take as directed by coumadin clinic.    Aspirin 81 Mg Tbec (Aspirin) .Marland Kitchen... Take one tablet by mouth daily  Problem # 3:  Hx of CVA (ICD-434.91)  as above  Her updated medication list for this problem includes:    Coumadin 5 Mg Tabs (Warfarin sodium) .Marland Kitchen... Take as directed by coumadin clinic.    Aspirin 81 Mg Tbec (Aspirin) .Marland Kitchen... Take one tablet by mouth daily  Her updated medication list for this problem includes:    Coumadin 5 Mg Tabs (Warfarin sodium) .Marland Kitchen... Take as directed by coumadin clinic.    Aspirin 81 Mg Tbec (Aspirin) .Marland Kitchen... Take one tablet by mouth daily  Other  Orders: Misc.  Referral (Misc. Ref) MRI (MRI)  Patient Instructions: 1)  Your physician recommends that you schedule a follow-up appointment in: 3 months 2)  Your physician has recommended you make the following change in your medication: Decrease Amiodarone to one pill a day.  3)  You have been referred to: [facility] 4)  MRI of head 5)  Record blood pressure when having dizzy spells.

## 2010-10-25 NOTE — Medication Information (Signed)
Summary: rov/sp  Anticoagulant Therapy  Managed by: Cloyde Reams, RN, BSN Referring MD: Sherryl Manges MD PCP: Marga Melnick MD Supervising MD: Tenny Craw MD, Gunnar Fusi Indication 1: Atrial Fibrillation (ICD-427.31) Lab Used: [facility] Site: [facility] INR POC 2.0 INR RANGE 2 - 3  Dietary changes: yes       Details: Not eating any food this week d/t surgery.      Recent/future hospitalizations: yes       Details: Lap band surgery 07/10/10.  Stopped Coumadin before surgery, and restarted 10/19 on 7.5mg .  Also bridging with lovenox BID.    Any changes in medication regimen? yes        Allergies: 1)  ! * Dental Anesthesia With Epinephrine  Anticoagulation Management History:      The patient is taking warfarin and comes in today for a routine follow up visit.  Positive risk factors for bleeding include an age of 67 years or older and history of CVA/TIA.  The bleeding index is 'intermediate risk'.  Positive CHADS2 values include History of CHF and Prior Stroke/CVA/TIA.  Negative CHADS2 values include Age > 67 years old.  The start date was 07/25/2003.  Her last INR was 1.3 ratio.  Anticoagulation responsible provider: Tenny Craw MD, Gunnar Fusi.  INR POC: 2.0.  Cuvette Lot#: [id number].  Exp: 07/2011.    Anticoagulation Management Assessment/Plan:      The patient's current anticoagulation dose is Coumadin 5 mg tabs: Take as directed by coumadin clinic..  The target INR is 2 - 3.  The next INR is due 07/19/2010.  Anticoagulation instructions were given to patient.  Results were reviewed/authorized by Cloyde Reams, RN, BSN.  She was notified by Haynes Hoehn, PharmD Candidate.         Prior Anticoagulation Instructions: INR 2.8  Continue taking Coumadin 1 tab (5 mg) on Mon and Fri and Coumadin 0.5 tab (2.5 mg) on all other days. Will call to f/u about bridge therapy for procedure on 10/18.  Current Anticoagulation Instructions: INR 2.0  Stop lovenox injections and resume normal Coumadin schedule:  1/2 tablet every day of the week, except 1 tablet on Monday and Friday.  Return to clinic in 1 week.

## 2010-10-25 NOTE — Assessment & Plan Note (Signed)
Summary: rto 1 month/cbs   Vital Signs:  Patient profile:   65 year old female Height:      66 inches Weight:      259.4 pounds BMI:     42.02 Pulse rate:   64 / minute Resp:     17 per minute BP sitting:   118 / 78  (left arm) Cuff size:   large  Vitals Entered By: Shonna Chock CMA (May 17, 2010 10:04 AM) CC: 1 month follow-up, not other concerns   Primary Care Provider:  Marga Melnick MD  CC:  1 month follow-up and not other concerns.  History of Present Illness: Weight down 3.4 # since last appt in 03/2009 ; she questions appetite stimulation by Arimidex.Epocrates program reviewed concerning  its adverse effects.She remains on low carb & is  restricting High Fructose Corn Syrup . Pathophysiology of glycemic index & load discussed; she had been avoiding fruits as part of modified [facility] Diet. CVE decreased in past week due to surgery for ingrown toenail. Previously  in water aerobics in 45 min 3X/week w/o symptoms.  Current Medications (verified): 1)  [facility] Aspirin 81 Mg Tbec (Aspirin) .... Take 1 Tablet By Mouth Once A Day 2)  Amiodarone Hcl 200 Mg Tabs (Amiodarone Hcl) .... Take 1 Tablet Two Times A Day 3)  Lasix 40 Mg Tabs (Furosemide) .... One Tab By Mouth Daily As Directed 4)  Coumadin 5 Mg Tabs (Warfarin Sodium) .... Take As Directed By Coumadin Clinic. 5)  Levothyroxine Sodium 200 Mcg Tabs (Levothyroxine Sodium) .... Take 1 Tablet Daily 6)  Tylenol Pm Extra Strength 500-25 Mg Tabs (Diphenhydramine-Apap (Sleep)) .... As Needed 7)  Fish Oil 1000 Mg Caps (Omega-3 Fatty Acids) .... Take 1 Tab Once Daily 8)  Multivitamins  Tabs (Multiple Vitamin) .... Take 1 Tablet By Mouth Once A Day 9)  Calcium 500 Mg Tabs (Calcium) .Marland Kitchen.. 1 By Mouth Once Daily 10)  Vitamin D3 1000 Unit Tabs (Cholecalciferol) .Marland Kitchen.. 1 By Mouth Once Daily 11)  Arimidex 1 Mg Tabs (Anastrozole) .... Take 1 Tab Daily 12)  Biotin 5000 Mcg Caps (Biotin) .Marland Kitchen.. 1 By Mouth Once Daily  Allergies: 1)  ! *  Dental Anesthesia With Epinephrine  Review of Systems General:  Denies sleep disorder; Fatigue stable; she is taking afternoon nap. CV:  Denies chest pain or discomfort, leg cramps with exertion, palpitations, and shortness of breath with exertion. Resp:  Denies excessive snoring, hypersomnolence, and morning headaches. GI:  Denies constipation and diarrhea.  Physical Exam  General:  Obesity(see weight loss),in no acute distress; alert,appropriate and cooperative throughout examination Neck:  No deformities, masses, or tenderness noted. Lungs:  Normal respiratory effort, chest expands symmetrically. Lungs are clear to auscultation, no crackles or wheezes. Heart:  regular rhythm, no murmur, no gallop, no rub, no JVD, and bradycardia.   Pulses:  R and L carotid,radial,dorsalis pedis and posterior tibial pulses are full and equal bilaterally Extremities:  No clubbing, cyanosis. Lipedema w/o pitting. Neurologic:  alert & oriented X3 and DTRs symmetrical and normal.   Psych:  memory intact for recent and remote, normally interactive, good eye contact, not anxious appearing, and not depressed appearing.  Focused & intelligent.   Impression & Recommendations:  Problem # 1:  MORBID OBESITY (ICD-278.01)  Problem # 2:  NONSPEC ELEVATION OF LEVELS OF TRANSAMINASE/LDH (ICD-790.4) PMH of  Problem # 3:  HYPERLIPIDEMIA (ICD-272.2)  Problem # 4:  HYPERGLYCEMIA, FASTING (ICD-790.29) PMH of  Complete Medication List: 1)  [facility]  Jomarie Longs Aspirin 81 Mg Tbec (Aspirin) .... Take 1 tablet by mouth once a day 2)  Amiodarone Hcl 200 Mg Tabs (Amiodarone hcl) .... Take 1 tablet two times a day 3)  Lasix 40 Mg Tabs (Furosemide) .... One tab by mouth daily as directed 4)  Coumadin 5 Mg Tabs (Warfarin sodium) .... Take as directed by coumadin clinic. 5)  Levothyroxine Sodium 200 Mcg Tabs (Levothyroxine sodium) .... Take 1 tablet daily 6)  Tylenol Pm Extra Strength 500-25 Mg Tabs (Diphenhydramine-apap (sleep)) ....  As needed 7)  Fish Oil 1000 Mg Caps (Omega-3 fatty acids) .... Take 1 tab once daily 8)  Multivitamins Tabs (Multiple vitamin) .... Take 1 tablet by mouth once a day 9)  Calcium 500 Mg Tabs (Calcium) .Marland Kitchen.. 1 by mouth once daily 10)  Vitamin D3 1000 Unit Tabs (Cholecalciferol) .Marland Kitchen.. 1 by mouth once daily 11)  Arimidex 1 Mg Tabs (Anastrozole) .... Take 1 tab daily 12)  Biotin 5000 Mcg Caps (Biotin) .Marland Kitchen.. 1 by mouth once daily  Patient Instructions: 1)  Review notes on Glycemic Index & Load as discussed.Please schedule fasting labs: 2)  Hepatic Panel 790.4; 3)  Lipid Panel  272.4; 4)  HbgA1C  790.29.

## 2010-10-25 NOTE — Assessment & Plan Note (Signed)
Summary: rto 1 month/cbs   Vital Signs:  Patient profile:   65 year old female Weight:      262.8 pounds BMI:     43.22 Pulse rate:   60 / minute Resp:     17 per minute BP sitting:   130 / 70  (left arm) Cuff size:   large  Vitals Entered By: Shonna Chock CMA (April 19, 2010 3:10 PM) CC: 1 month follow-up    Primary Care Provider:  Marga Melnick MD  CC:  1 month follow-up .  History of Present Illness: By her scale she has lost 10# with TLC; see VS for  recorded weight & BMI. She questions whether Arimidex is hindering weight loss. CVE : water exercise  3X/ week 45 min  & then  treadmill X 30 min w/o limitation or symptoms except max heart rate of 103 . Diet program as per Nutritionist; compliance has been good.  Current Medications (verified): 1)  [facility] Aspirin 81 Mg Tbec (Aspirin) .... Take 1 Tablet By Mouth Once A Day 2)  Amiodarone Hcl 200 Mg Tabs (Amiodarone Hcl) .... Take 1 Tablet Two Times A Day 3)  Lasix 40 Mg Tabs (Furosemide) .... One Tab By Mouth Daily As Directed 4)  Coumadin 5 Mg Tabs (Warfarin Sodium) .... Take As Directed By Coumadin Clinic. 5)  Levothyroxine Sodium 200 Mcg Tabs (Levothyroxine Sodium) .... Take 1 Tablet Daily 6)  Tylenol Pm Extra Strength 500-25 Mg Tabs (Diphenhydramine-Apap (Sleep)) .... As Needed 7)  Fish Oil 1000 Mg Caps (Omega-3 Fatty Acids) .... Take 1 Tab Once Daily 8)  Multivitamins  Tabs (Multiple Vitamin) .... Take 1 Tablet By Mouth Once A Day 9)  Calcium Carbonate-Vitamin D 600-400 Mg-Unit  Tabs (Calcium Carbonate-Vitamin D) .Marland Kitchen.. 1 By Mouth Once Daily 10)  Vitamin D3 2000 Unit Caps (Cholecalciferol) .... Take 1 Tab Once Daily 11)  Pepcid 40 Mg Tabs (Famotidine) .... As Needed 12)  Arimidex 1 Mg Tabs (Anastrozole) .... Take 1 Tab Daily 13)  Biotin 10 Mg Tabs (Biotin) .Marland Kitchen.. 1 By Mouth Once Daily  Allergies: 1)  ! * Dental Anesthesia With Epinephrine  Review of Systems General:  Complains of fatigue and sweats; denies sleep  disorder; Night sweats improved. Eyes:  Denies blurring, double vision, and vision loss-both eyes. ENT:  Denies difficulty swallowing and hoarseness. CV:  Complains of shortness of breath with exertion; denies chest pain or discomfort, palpitations, swelling of feet, and swelling of hands; DOE only @ max exercise level. Resp:  Denies shortness of breath. GI:  Denies change in bowel habits, constipation, diarrhea, excessive appetite, loss of appetite, nausea, and vomiting. Derm:  Denies dryness and hair loss; Nails split ; Rx: Biotin. Neuro:  Denies numbness and tingling. Psych:  Denies anxiety and depression. Endo:  Denies cold intolerance, excessive hunger, excessive thirst, and excessive urination; Nocturnal heat intolerance due to sweats.  Physical Exam  General:  morbid obesity,in no acute distress; alert,appropriate and cooperative throughout examination Eyes:  No corneal or conjunctival inflammation noted. No lid lag  Neck:  No deformities, masses, or tenderness noted. Post op changes Lungs:  Normal respiratory effort, chest expands symmetrically. Lungs are clear to auscultation, no crackles or wheezes. Heart:  regular rhythm, no gallop, no rub, no JVD, bradycardia, and grade  1/6 systolic murmur.   Neurologic:  alert & oriented X3 and DTRs symmetrical and normal.   Skin:  Intact without suspicious lesions or rashes   Impression & Recommendations:  Problem # 1:  MORBID OBESITY (ICD-278.01) compliance with Protocol with appropriate weight loss of > 6 #  Problem # 2:  CARCINOMA, THYROID GLAND, HX OF (ICD-V10.87) as per Dr Evlyn Kanner  Complete Medication List: 1)  Mendel Ryder Aspirin 81 Mg Tbec (Aspirin) .... Take 1 tablet by mouth once a day 2)  Amiodarone Hcl 200 Mg Tabs (Amiodarone hcl) .... Take 1 tablet two times a day 3)  Lasix 40 Mg Tabs (Furosemide) .... One tab by mouth daily as directed 4)  Coumadin 5 Mg Tabs (Warfarin sodium) .... Take as directed by coumadin clinic. 5)   Levothyroxine Sodium 200 Mcg Tabs (Levothyroxine sodium) .... Take 1 tablet daily 6)  Tylenol Pm Extra Strength 500-25 Mg Tabs (Diphenhydramine-apap (sleep)) .... As needed 7)  Fish Oil 1000 Mg Caps (Omega-3 fatty acids) .... Take 1 tab once daily 8)  Multivitamins Tabs (Multiple vitamin) .... Take 1 tablet by mouth once a day 9)  Calcium Carbonate-vitamin D 600-400 Mg-unit Tabs (Calcium carbonate-vitamin d) .Marland Kitchen.. 1 by mouth once daily 10)  Vitamin D3 2000 Unit Caps (Cholecalciferol) .... Take 1 tab once daily 11)  Pepcid 40 Mg Tabs (Famotidine) .... As needed 12)  Arimidex 1 Mg Tabs (Anastrozole) .... Take 1 tab daily 13)  Biotin 10 Mg Tabs (Biotin) .Marland Kitchen.. 1 by mouth once daily  Patient Instructions: 1)  Share record with all care providers seen.

## 2010-10-25 NOTE — Miscellaneous (Signed)
  Clinical Lists Changes  Observations: Added new observation of ECHOINTERP: - Left ventricle: The cavity size was normal. Systolic function was       normal. The estimated ejection fraction was in the range of 55% to       60%. Wall motion was normal; there were no regional wall motion       abnormalities.     - Mitral valve: Calcified annulus.     - Left atrium: The atrium was mildly dilated.     - Atrial septum: The septum bowed from left to right, consistent       with increased left atrial pressure. (07/03/2010 8:01)      Echocardiogram  Procedure date:  07/03/2010  Findings:      - Left ventricle: The cavity size was normal. Systolic function was       normal. The estimated ejection fraction was in the range of 55% to       60%. Wall motion was normal; there were no regional wall motion       abnormalities.     - Mitral valve: Calcified annulus.     - Left atrium: The atrium was mildly dilated.     - Atrial septum: The septum bowed from left to right, consistent       with increased left atrial pressure.

## 2010-10-25 NOTE — Assessment & Plan Note (Signed)
Summary: rov. gd   Visit Type:  Follow-up Primary Provider:  Marga Melnick MD  CC:  meds checked.  History of Present Illness: Primary Electrophysiologist:  Dr. Sherryl Manges  Kayla Harrison is a 65 year old female with a history of atrial fibrillation status post ablation at [facility] on Coumadin and amiodarone therapy as well as diastolic heart failure who returns for followup.  She last saw Dr. Graciela Husbands in May and was asked to restart her diuretics due to increased dyspnea felt to be related to a/c diastolic CHF.  Since last being seen by Dr. Graciela Husbands, she underwent a lap band procedure on October 18 for obesity.  Her most recent echocardiogram done in October 2011 demonstrates an ejection fraction of 55-60% and mild left atrial enlargement.    She was asked by her surgeon to followup today.  She has had episodes of near syncope recently.  These would always occur when she was using her arms to do something or lift her arms above her head.  She denies any arm pain.  With this, she would develop low back pain.  After she developed low back pain, she would develop a feeling as though she may pass out.  She had a d-dimer that was elevated.  However, her chest CT was negative for pulmonary embolism.  She was under the impression she had an acute pulmonary embolus that dissolved.  I tried to explain the meaning of an elevated D-dimer today.  She did have some dyspnea with exertion.  She was asked by Dr. Alwyn Ren to use incentive spirometry.  Over the last 5 days her symptoms have improved.  She went to the pool this morning and did exercises.  She denies any dyspnea with this.  She denies chest pain, orthopnea, PND or significant pedal edema.  Of note, she has lost approx. 40 pounds.  Current Medications (verified): 1)  Amiodarone Hcl 200 Mg Tabs (Amiodarone Hcl) .... Take 1 Tablet Two Times A Day 2)  Lasix 40 Mg Tabs (Furosemide) .... One Tab By Mouth Daily As Directed As Needed 3)  Coumadin 5 Mg Tabs  (Warfarin Sodium) .... Take As Directed By Coumadin Clinic. 4)  Levothyroxine Sodium 200 Mcg Tabs (Levothyroxine Sodium) .... Take 1 Tablet Daily 5)  Tylenol Pm Extra Strength 500-25 Mg Tabs (Diphenhydramine-Apap (Sleep)) .... As Needed 6)  Multivitamins  Tabs (Multiple Vitamin) .... Take 1 Tablet By Mouth Once A Day 7)  Calcium 500 Mg Tabs (Calcium) .Marland Kitchen.. 1 By Mouth Once Daily 8)  Vitamin D3 1000 Unit Tabs (Cholecalciferol) .Marland Kitchen.. 1 By Mouth Once Daily 9)  Arimidex 1 Mg Tabs (Anastrozole) .... Take 1 Tab Daily 10)  Crestor 20 Mg Tabs (Rosuvastatin Calcium) .... Take One Tablet M,w,f At Bedtime  Allergies: 1)  ! * Dental Anesthesia With Epinephrine  Past History:  Past Medical History: Atrial fibrillation - s/p ablation done at [facility]. CVA X2-3 while in [place] 2004 ; not on coumadin Dyslipidemia History of thyroid cancer Breast cancer 07/23/2009 Colonic polyps, hx of  premalignant in 1992, benign polyps since  Past Surgical History: Thyroidectomy for CA 1998  Dr Jamey Ripa; Dr Evlyn Kanner; post op RAI X 2 Benign ovarian tumor 1998; [facility]  Dr Nicholas Lose Hysterectomy 1983 fibroids & endometriosis Cholecystectomy Tonsillectomy Colon polypectomy 1992 , pre malignant; last colonoscopy 2006 : neg AFib ablation done at [facility]  Review of Systems       As per  the HPI.  All other systems reviewed and negative.   Vital Signs:  Patient profile:   65 year old female Height:      66 inches Weight:      234 pounds BMI:     37.91 Pulse rate:   64 / minute Pulse rhythm:   regular BP sitting:   138 / 80  (left arm)  Serial Vital Signs/Assessments:  Time      Position  BP       Pulse  Resp  Temp     By 2:44 PM             104/78                         Tereso Newcomer PA-C 2:45 PM             116/78                         Tereso Newcomer PA-C  Comments: 2:44 PM left arm By: Tereso Newcomer PA-C  2:45 PM right arm By: Tereso Newcomer PA-C    Physical Exam  General:  Well nourished, well  developed, in no acute distress HEENT: normal Neck: no JVD Cardiac:  normal S1, S2; RRR; no murmur Lungs:  clear to auscultation bilaterally, no wheezing, rhonchi or rales Abd: soft, nontender, no hepatomegaly Ext: no clubbing, cyanosis or edema Vascular: no carotid  bruits; no supraclavicular bruits; radial and ulnar pulses intact bilat. Skin: warm and dry Neuro:  CNs 2-12 intact, no focal abnormalities noted    EKG  Procedure date:  08/06/2010  Findings:      Normal sinus rhythm Heart rate 63 LVH Left axis deviation No ischemic changes PR 198 ms  Impression & Recommendations:  Problem # 1:  ATRIAL FIBRILLATION (ICD-427.31)  Her TSH is monitored by Dr. Evlyn Kanner. D/w Dr. Ladona Ridgel.  Doubt her symptoms are related to Amio. Will have her decrease Amio to 300 mg once daily as she is losing fat cells now with her recent surgery. F/u with Dr. Graciela Husbands in 4 weeks to discuss further reductions.  Problem # 2:  CHRONIC DIASTOLIC HEART FAILURE (ICD-428.32)  Echo in 06/2010 with normal LVF. Volume appears stable.  Problem # 3:  MORBID OBESITY (ICD-278.01) s/p Lap Band procedure in 06/2010 She has lost 27 pounds by our scales since Sept. 2011.  Problem # 4:  HYPERLIPIDEMIA (ICD-272.2)  Managed by PCP.  Problem # 5:  SYNCOPE AND COLLAPSE (ICD-780.2)  She had symptoms of near syncope. Her symptoms are related to back pain. CT was neg for PE. She denies symptoms since last week. BPs in arms are equal and no supraclavicular bruits.  She does not seem to have subclavian steal.   Reassurance.  ? pain induced symptoms.  Patient Instructions: 1)  Your physician recommends that you schedule a follow-up appointment in: 4 weeks with Dr. Graciela Husbands. 2)  Your physician has recommended you make the following change in your medication: Take amiodarone 200 mg 1 and 1/2 tablet once a day.

## 2010-10-25 NOTE — Medication Information (Signed)
Summary: rov/ewj  Anticoagulant Therapy  Managed by: Cloyde Reams, RN, BSN Referring MD: Sherryl Manges MD PCP: Marga Melnick MD Supervising MD: Gala Romney MD, Reuel Boom Indication 1: Atrial Fibrillation (ICD-427.31) Lab Used: [facility] Site: [facility] INR POC 2.3 INR RANGE 2 - 3  Dietary changes: no    Health status changes: no    Bleeding/hemorrhagic complications: yes       Details: Pt reports darker stools, Pt is going to monitor and contact primary MD if persists or worsens.    Recent/future hospitalizations: no    Any changes in medication regimen? yes       Details: Will be starting Arimidex on 11/10/09.    Recent/future dental: no  Any missed doses?: no       Is patient compliant with meds? yes       Allergies: 1)  ! * Dental Anesthesia With Epinephrine  Anticoagulation Management History:      The patient is taking warfarin and comes in today for a routine follow up visit.  Positive risk factors for bleeding include an age of 67 years or older and history of CVA/TIA.  The bleeding index is 'intermediate risk'.  Positive CHADS2 values include History of CHF and Prior Stroke/CVA/TIA.  Negative CHADS2 values include Age > 67 years old.  The start date was 07/25/2003.  Her last INR was 2.7.  Anticoagulation responsible provider: Bensimhon MD, Reuel Boom.  INR POC: 2.3.  Cuvette Lot#: [id number].  Exp: 12/2010.    Anticoagulation Management Assessment/Plan:      The patient's current anticoagulation dose is Coumadin 5 mg tabs: Take as directed by coumadin clinic., Coumadin 2.5 mg tabs: take one daily.  The target INR is 2 - 3.  The next INR is due 11/21/2009.  Anticoagulation instructions were given to patient.  Results were reviewed/authorized by Cloyde Reams, RN, BSN.  She was notified by Cloyde Reams RN.         Prior Anticoagulation Instructions: INR 2.0  Continue on same dosage 1/2 tablet daily except 1 tablet on Mondays and Fridays.   Recheck in 3 weeks.    Current  Anticoagulation Instructions: INR 2.3  Continue on same dosage 1/2 tablet daily except 1 tablet on Mondays and Fridays.  Recheck in 4 weeks.

## 2010-10-25 NOTE — Medication Information (Signed)
Summary: rov/ewj  Anticoagulant Therapy  Managed by: Cloyde Reams, RN, BSN Referring MD: Sherryl Manges MD PCP: Marga Melnick MD Supervising MD: Ladona Ridgel MD, Sharlot Gowda Indication 1: Atrial Fibrillation (ICD-427.31) Lab Used: [facility] Site: [facility] INR POC 2.1 INR RANGE 2 - 3  Dietary changes: no    Health status changes: no    Bleeding/hemorrhagic complications: no    Recent/future hospitalizations: no    Any changes in medication regimen? no    Recent/future dental: no  Any missed doses?: no       Is patient compliant with meds? yes       Allergies: 1)  ! * Dental Anesthesia With Epinephrine  Anticoagulation Management History:      The patient is taking warfarin and comes in today for a routine follow up visit.  Positive risk factors for bleeding include an age of 65 years or older and history of CVA/TIA.  The bleeding index is 'intermediate risk'.  Positive CHADS2 values include History of CHF and Prior Stroke/CVA/TIA.  Negative CHADS2 values include Age > 16 years old.  The start date was 07/25/2003.  Her last INR was 1.3 ratio.  Anticoagulation responsible provider: Ladona Ridgel MD, Sharlot Gowda.  INR POC: 2.1.  Cuvette Lot#: [id number].  Exp: 10/2011.    Anticoagulation Management Assessment/Plan:      The patient's current anticoagulation dose is Coumadin 5 mg tabs: Take as directed by coumadin clinic..  The target INR is 2 - 3.  The next INR is due 11/01/2010.  Anticoagulation instructions were given to patient.  Results were reviewed/authorized by Cloyde Reams, RN, BSN.  She was notified by Stephannie Peters, PharmD Candidate .         Prior Anticoagulation Instructions: INR 1.5  Take 1 tablet today and tomorrow, then start taking 1/2 tablet daily except 1 tablet on Thursdays.  Recheck in 2 weeks.   Current Anticoagulation Instructions: INR 2.1  Coumadin 5 mg tablets - Take 1/2 tablet every day except 1 whole tablet on Thursdays

## 2010-11-01 ENCOUNTER — Encounter: Payer: Self-pay | Admitting: Internal Medicine

## 2010-11-01 ENCOUNTER — Encounter (INDEPENDENT_AMBULATORY_CARE_PROVIDER_SITE_OTHER): Payer: Medicare Other

## 2010-11-01 DIAGNOSIS — Z7901 Long term (current) use of anticoagulants: Secondary | ICD-10-CM

## 2010-11-01 DIAGNOSIS — I4891 Unspecified atrial fibrillation: Secondary | ICD-10-CM

## 2010-11-08 NOTE — Medication Information (Signed)
Summary: Coumadin Clinic  Anticoagulant Therapy  Managed by: Cloyde Reams, RN, BSN Referring MD: Sherryl Manges MD PCP: Marga Melnick MD Supervising MD: Ladona Ridgel MD, Sharlot Gowda Indication 1: Atrial Fibrillation (ICD-427.31) Lab Used: [facility] Site: [facility] INR POC 2.1 INR RANGE 2 - 3  Dietary changes: yes       Details: Incr in Romaine lettuce  Health status changes: no    Bleeding/hemorrhagic complications: yes       Details: Nose bleeds  Recent/future hospitalizations: no    Any changes in medication regimen? yes       Details: Started on Crestor and Biotin.   Recent/future dental: no  Any missed doses?: no       Is patient compliant with meds? yes       Allergies: 1)  ! * Dental Anesthesia With Epinephrine  Anticoagulation Management History:      The patient is taking warfarin and comes in today for a routine follow up visit.  Positive risk factors for bleeding include an age of 65 years or older and history of CVA/TIA.  The bleeding index is 'intermediate risk'.  Positive CHADS2 values include History of CHF and Prior Stroke/CVA/TIA.  Negative CHADS2 values include History of HTN, Age > 67 years old, and History of Diabetes.  The start date was 07/25/2003.  Her last INR was 1.3 ratio.  Anticoagulation responsible provider: Ladona Ridgel MD, Sharlot Gowda.  INR POC: 2.1.  Cuvette Lot#: [id number].  Exp: 09/2011.    Anticoagulation Management Assessment/Plan:      The patient's current anticoagulation dose is Coumadin 5 mg tabs: Take as directed by coumadin clinic..  The target INR is 2 - 3.  The next INR is due 11/29/2010.  Anticoagulation instructions were given to patient.  Results were reviewed/authorized by Cloyde Reams, RN, BSN.  She was notified by Cloyde Reams RN.         Prior Anticoagulation Instructions: INR 2.1  Coumadin 5 mg tablets - Take 1/2 tablet every day except 1 whole tablet on Thursdays   Current Anticoagulation Instructions: INR 2.1  Continue on same dosage  1/2 tablet daily except 1 tablet on Thursdays.  Recheck in 4 weeks.

## 2010-11-19 ENCOUNTER — Encounter: Payer: Self-pay | Admitting: Internal Medicine

## 2010-11-19 DIAGNOSIS — I4891 Unspecified atrial fibrillation: Secondary | ICD-10-CM

## 2010-11-19 DIAGNOSIS — I6350 Cerebral infarction due to unspecified occlusion or stenosis of unspecified cerebral artery: Secondary | ICD-10-CM

## 2010-11-27 ENCOUNTER — Encounter: Payer: Self-pay | Admitting: Internal Medicine

## 2010-11-29 ENCOUNTER — Encounter: Payer: Self-pay | Admitting: Internal Medicine

## 2010-11-29 ENCOUNTER — Encounter (INDEPENDENT_AMBULATORY_CARE_PROVIDER_SITE_OTHER): Payer: Medicare Other

## 2010-11-29 DIAGNOSIS — I4891 Unspecified atrial fibrillation: Secondary | ICD-10-CM

## 2010-11-29 DIAGNOSIS — Z7901 Long term (current) use of anticoagulants: Secondary | ICD-10-CM

## 2010-12-04 NOTE — Medication Information (Signed)
Summary: rov/ewj  Anticoagulant Therapy  Managed by: Weston Brass, PharmD Referring MD: Sherryl Manges MD PCP: Marga Melnick MD Supervising MD: Ladona Ridgel MD, Sharlot Gowda Indication 1: Atrial Fibrillation (ICD-427.31) Lab Used: [facility] Site: [facility] INR POC 1.8 INR RANGE 2 - 3  Dietary changes: yes       Details: less salads   Health status changes: no    Bleeding/hemorrhagic complications: no    Recent/future hospitalizations: no    Any changes in medication regimen? yes       Details: on pravastatin  Recent/future dental: no  Any missed doses?: no       Is patient compliant with meds? yes       Allergies: 1)  ! * Dental Anesthesia With Epinephrine  Anticoagulation Management History:      The patient is taking warfarin and comes in today for a routine follow up visit.  Positive risk factors for bleeding include an age of 67 years or older and history of CVA/TIA.  The bleeding index is 'intermediate risk'.  Positive CHADS2 values include History of CHF and Prior Stroke/CVA/TIA.  Negative CHADS2 values include History of HTN, Age > 67 years old, and History of Diabetes.  The start date was 07/25/2003.  Her last INR was 1.3 ratio.  Anticoagulation responsible provider: Ladona Ridgel MD, Sharlot Gowda.  INR POC: 1.8.  Cuvette Lot#: [id number].  Exp: 11/2011.    Anticoagulation Management Assessment/Plan:      The patient's current anticoagulation dose is Coumadin 5 mg tabs: Take as directed by coumadin clinic..  The target INR is 2 - 3.  The next INR is due 12/20/2010.  Anticoagulation instructions were given to patient.  Results were reviewed/authorized by Weston Brass, PharmD.  She was notified by Weston Brass PharmD.         Prior Anticoagulation Instructions: INR 2.1  Continue on same dosage 1/2 tablet daily except 1 tablet on Thursdays.  Recheck in 4 weeks.   Current Anticoagulation Instructions: INR 1.8  Take 1 1/2 tablets today then resume same dose of 1/2 tablet every day.  Recheck INR in  3 weeks.

## 2010-12-05 LAB — DIFFERENTIAL
Basophils Absolute: 0 10*3/uL (ref 0.0–0.1)
Basophils Relative: 0 % (ref 0–1)
Lymphocytes Relative: 5 % — ABNORMAL LOW (ref 12–46)
Monocytes Absolute: 0.7 10*3/uL (ref 0.1–1.0)
Neutro Abs: 7 10*3/uL (ref 1.7–7.7)
Neutrophils Relative %: 86 % — ABNORMAL HIGH (ref 43–77)

## 2010-12-05 LAB — CBC
HCT: 32.8 % — ABNORMAL LOW (ref 36.0–46.0)
MCHC: 33.5 g/dL (ref 30.0–36.0)
Platelets: 153 10*3/uL (ref 150–400)
RDW: 14.8 % (ref 11.5–15.5)
WBC: 8.1 10*3/uL (ref 4.0–10.5)

## 2010-12-05 LAB — HEMOGLOBIN AND HEMATOCRIT, BLOOD
HCT: 35 % — ABNORMAL LOW (ref 36.0–46.0)
Hemoglobin: 11.7 g/dL — ABNORMAL LOW (ref 12.0–15.0)

## 2010-12-05 LAB — PROTIME-INR
INR: 1.28 (ref 0.00–1.49)
Prothrombin Time: 16.2 seconds — ABNORMAL HIGH (ref 11.6–15.2)

## 2010-12-05 LAB — APTT: aPTT: 38 seconds — ABNORMAL HIGH (ref 24–37)

## 2010-12-06 LAB — COMPREHENSIVE METABOLIC PANEL
ALT: 62 U/L — ABNORMAL HIGH (ref 0–35)
AST: 57 U/L — ABNORMAL HIGH (ref 0–37)
Alkaline Phosphatase: 69 U/L (ref 39–117)
CO2: 27 mEq/L (ref 19–32)
Calcium: 9.5 mg/dL (ref 8.4–10.5)
GFR calc Af Amer: 60 mL/min — ABNORMAL LOW (ref >60)
GFR calc non Af Amer: 49 mL/min — ABNORMAL LOW (ref >60)
Potassium: 4.4 mEq/L (ref 3.5–5.1)
Sodium: 140 mEq/L (ref 135–145)
Total Protein: 6.9 g/dL (ref 6.0–8.3)

## 2010-12-06 LAB — CBC
Hemoglobin: 13.3 g/dL (ref 12.0–15.0)
MCHC: 34.1 g/dL (ref 30.0–36.0)
WBC: 5.7 10*3/uL (ref 4.0–10.5)

## 2010-12-06 LAB — DIFFERENTIAL
Basophils Relative: 1 % (ref 0–1)
Eosinophils Absolute: 0.1 10*3/uL (ref 0.0–0.7)
Eosinophils Relative: 1 % (ref 0–5)
Lymphs Abs: 0.8 10*3/uL (ref 0.7–4.0)
Monocytes Relative: 11 % (ref 3–12)

## 2010-12-06 LAB — SURGICAL PCR SCREEN: MRSA, PCR: NEGATIVE

## 2010-12-10 ENCOUNTER — Other Ambulatory Visit: Payer: Self-pay | Admitting: Oncology

## 2010-12-10 ENCOUNTER — Encounter (HOSPITAL_BASED_OUTPATIENT_CLINIC_OR_DEPARTMENT_OTHER): Payer: Medicare Other | Admitting: Oncology

## 2010-12-10 DIAGNOSIS — D0590 Unspecified type of carcinoma in situ of unspecified breast: Secondary | ICD-10-CM

## 2010-12-10 LAB — CBC WITH DIFFERENTIAL/PLATELET
BASO%: 0.6 % (ref 0.0–2.0)
Basophils Absolute: 0 10*3/uL (ref 0.0–0.1)
Eosinophils Absolute: 0.1 10*3/uL (ref 0.0–0.5)
HCT: 34.3 % — ABNORMAL LOW (ref 34.8–46.6)
HGB: 11.6 g/dL (ref 11.6–15.9)
MONO#: 0.5 10*3/uL (ref 0.1–0.9)
NEUT#: 4 10*3/uL (ref 1.5–6.5)
NEUT%: 74.4 % (ref 38.4–76.8)
WBC: 5.4 10*3/uL (ref 3.9–10.3)
lymph#: 0.8 10*3/uL — ABNORMAL LOW (ref 0.9–3.3)

## 2010-12-10 LAB — COMPREHENSIVE METABOLIC PANEL
ALT: 28 U/L (ref 0–35)
BUN: 22 mg/dL (ref 6–23)
CO2: 27 mEq/L (ref 19–32)
Calcium: 8.9 mg/dL (ref 8.4–10.5)
Chloride: 106 mEq/L (ref 96–112)
Creatinine, Ser: 1.1 mg/dL (ref 0.40–1.20)
Glucose, Bld: 101 mg/dL — ABNORMAL HIGH (ref 70–99)

## 2010-12-11 NOTE — Letter (Signed)
Summary: [facility]  [facility]   Imported By: Kassie Mends 12/06/2010 10:00:05  _____________________________________________________________________  External Attachment:    Type:   Image     Comment:   External Document

## 2010-12-12 ENCOUNTER — Encounter: Payer: Medicare Other | Attending: Surgery | Admitting: *Deleted

## 2010-12-12 ENCOUNTER — Encounter: Payer: Self-pay | Admitting: Internal Medicine

## 2010-12-12 DIAGNOSIS — Z09 Encounter for follow-up examination after completed treatment for conditions other than malignant neoplasm: Secondary | ICD-10-CM | POA: Insufficient documentation

## 2010-12-12 DIAGNOSIS — Z713 Dietary counseling and surveillance: Secondary | ICD-10-CM | POA: Insufficient documentation

## 2010-12-12 DIAGNOSIS — Z9884 Bariatric surgery status: Secondary | ICD-10-CM | POA: Insufficient documentation

## 2010-12-13 ENCOUNTER — Ambulatory Visit: Payer: Self-pay | Admitting: Internal Medicine

## 2010-12-20 ENCOUNTER — Other Ambulatory Visit (INDEPENDENT_AMBULATORY_CARE_PROVIDER_SITE_OTHER): Payer: Medicare Other

## 2010-12-20 ENCOUNTER — Ambulatory Visit (INDEPENDENT_AMBULATORY_CARE_PROVIDER_SITE_OTHER): Payer: Medicare Other | Admitting: *Deleted

## 2010-12-20 DIAGNOSIS — I6350 Cerebral infarction due to unspecified occlusion or stenosis of unspecified cerebral artery: Secondary | ICD-10-CM

## 2010-12-20 DIAGNOSIS — Z7901 Long term (current) use of anticoagulants: Secondary | ICD-10-CM | POA: Insufficient documentation

## 2010-12-20 DIAGNOSIS — I4891 Unspecified atrial fibrillation: Secondary | ICD-10-CM

## 2010-12-20 DIAGNOSIS — T887XXA Unspecified adverse effect of drug or medicament, initial encounter: Secondary | ICD-10-CM

## 2010-12-20 DIAGNOSIS — E785 Hyperlipidemia, unspecified: Secondary | ICD-10-CM

## 2010-12-20 LAB — HEPATIC FUNCTION PANEL
Albumin: 3.4 g/dL — ABNORMAL LOW (ref 3.5–5.2)
Total Protein: 6.2 g/dL (ref 6.0–8.3)

## 2010-12-20 LAB — LIPID PANEL
HDL: 56.8 mg/dL (ref >39.00)
LDL Cholesterol: 118 mg/dL — ABNORMAL HIGH (ref 0–99)
Total CHOL/HDL Ratio: 3
Triglycerides: 75 mg/dL (ref 0.0–149.0)

## 2010-12-20 NOTE — Patient Instructions (Signed)
INR 1.6 Today take 1 1/2 tablets.  Then, begin taking 1/2 tablet daily, except take 1 tablet on Wednesdays. Recheck in 10 days.

## 2010-12-25 ENCOUNTER — Encounter: Payer: Self-pay | Admitting: Internal Medicine

## 2010-12-25 ENCOUNTER — Ambulatory Visit (INDEPENDENT_AMBULATORY_CARE_PROVIDER_SITE_OTHER): Payer: Medicare Other | Admitting: Internal Medicine

## 2010-12-25 DIAGNOSIS — I4891 Unspecified atrial fibrillation: Secondary | ICD-10-CM

## 2010-12-25 DIAGNOSIS — I6350 Cerebral infarction due to unspecified occlusion or stenosis of unspecified cerebral artery: Secondary | ICD-10-CM

## 2010-12-25 DIAGNOSIS — I5032 Chronic diastolic (congestive) heart failure: Secondary | ICD-10-CM

## 2010-12-25 DIAGNOSIS — I509 Heart failure, unspecified: Secondary | ICD-10-CM

## 2010-12-25 DIAGNOSIS — R233 Spontaneous ecchymoses: Secondary | ICD-10-CM | POA: Insufficient documentation

## 2010-12-25 DIAGNOSIS — Z79899 Other long term (current) drug therapy: Secondary | ICD-10-CM

## 2010-12-25 DIAGNOSIS — R55 Syncope and collapse: Secondary | ICD-10-CM

## 2010-12-25 LAB — URINALYSIS, ROUTINE W REFLEX MICROSCOPIC
Bilirubin Urine: NEGATIVE
Ketones, ur: NEGATIVE mg/dL
Nitrite: NEGATIVE
Protein, ur: 30 mg/dL — AB
pH: 5.5 (ref 5.0–8.0)

## 2010-12-25 LAB — DIFFERENTIAL
Basophils Absolute: 0 10*3/uL (ref 0.0–0.1)
Eosinophils Relative: 2 % (ref 0–5)
Lymphocytes Relative: 21 % (ref 12–46)
Lymphs Abs: 0.9 10*3/uL (ref 0.7–4.0)
Monocytes Absolute: 0.5 10*3/uL (ref 0.1–1.0)
Monocytes Relative: 11 % (ref 3–12)
Neutro Abs: 2.8 10*3/uL (ref 1.7–7.7)

## 2010-12-25 LAB — COMPREHENSIVE METABOLIC PANEL
AST: 113 U/L — ABNORMAL HIGH (ref 0–37)
Albumin: 3.3 g/dL — ABNORMAL LOW (ref 3.5–5.2)
BUN: 10 mg/dL (ref 6–23)
Calcium: 8.6 mg/dL (ref 8.4–10.5)
Creatinine, Ser: 1.02 mg/dL (ref 0.4–1.2)
GFR calc Af Amer: >60 mL/min (ref >60)
Total Bilirubin: 0.9 mg/dL (ref 0.3–1.2)
Total Protein: 6.3 g/dL (ref 6.0–8.3)

## 2010-12-25 LAB — CBC
HCT: 35.3 % — ABNORMAL LOW (ref 36.0–46.0)
MCHC: 34.6 g/dL (ref 30.0–36.0)
MCV: 88.2 fL (ref 78.0–100.0)
Platelets: 158 10*3/uL (ref 150–400)
RDW: 14.6 % (ref 11.5–15.5)
WBC: 4.2 10*3/uL (ref 4.0–10.5)

## 2010-12-25 LAB — URINE MICROSCOPIC-ADD ON

## 2010-12-25 LAB — PROTIME-INR: INR: 1.07 (ref 0.00–1.49)

## 2010-12-25 MED ORDER — AMIODARONE HCL 200 MG PO TABS: ORAL_TABLET | ORAL | Status: DC

## 2010-12-25 NOTE — Assessment & Plan Note (Signed)
We'll plan to stop her aspirin as it adds nothing but risk for warfarin

## 2010-12-25 NOTE — Patient Instructions (Signed)
Your physician recommends that you schedule a follow-up appointment in: YEAR WITH DR Graciela Husbands Your physician recommends that you return for lab work in: TODAY LIVER  V58.69 Your physician has recommended you make the following change in your medication: STOP ASPIRIN AND DECREASE AMIODARONE TO 5 DAYS A WEEK

## 2010-12-25 NOTE — Assessment & Plan Note (Signed)
Doing very well

## 2010-12-25 NOTE — Progress Notes (Signed)
  HPI  Kayla Harrison is a 65 y.o. female With morbid obesity status post lap band surgery who also has a history of atrial fibrillation for which she underwent pulmonary vein isolation at [facility] Without clinical recurrences. She has a history of prior strokes and remains on oral anticoagulation   Past Medical History  Diagnosis Date  . Atrial fibrillation   . Dyslipidemia   . Hx of thyroid cancer   . Breast cancer   . Colon polyp     Past Surgical History  Procedure Date  . Thyroidectomy 1998  . Bso 1998  . Cholecystectomy   . Tonsillectomy   . Colonoscopy w/ polypectomy   . Afib ablation     Current Outpatient Prescriptions  Medication Sig Dispense Refill  . amiodarone (PACERONE) 200 MG tablet Take 200 mg by mouth daily.        Marland Kitchen aspirin 81 MG tablet Take 81 mg by mouth daily.        . Biotin 5000 MCG CAPS Take 1 capsule by mouth 2 (two) times daily.        . calcium carbonate (TUMS - DOSED IN MG ELEMENTAL CALCIUM) 500 MG chewable tablet Chew 1 tablet by mouth 2 (two) times daily.        . Cholecalciferol (VITAMIN D3) 1000 UNITS CAPS Take by mouth daily.        . Diphenhydramine-APAP, sleep, (TYLENOL PM EXTRA STRENGTH PO) Take by mouth as directed.        . furosemide (LASIX) 40 MG tablet Take 40 mg by mouth as needed.       Marland Kitchen levothyroxine (SYNTHROID, LEVOTHROID) 200 MCG tablet Take 200 mcg by mouth as directed. Every day except Monday and Thursday take 100 mcg      . Multiple Vitamins-Minerals (ADEKS) chewable tablet Chew 1 tablet by mouth daily.        . niacin (NIASPAN) 500 MG CR tablet Take 500 mg by mouth at bedtime.        . Omega-3 Fatty Acids (FISH OIL) 1000 MG CAPS Take 1 capsule by mouth daily.        . pravastatin (PRAVACHOL) 20 MG tablet Take 20 mg by mouth daily. Take 1 tablet at bedtime.       Marland Kitchen warfarin (COUMADIN) 5 MG tablet Take by mouth as directed.        Marland Kitchen DISCONTD: anastrozole (ARIMIDEX) 1 MG tablet Take 1 mg by mouth daily.        Marland Kitchen DISCONTD:  rosuvastatin (CRESTOR) 10 MG tablet Take 10 mg by mouth daily.         Allergies  Allergen Reactions  . Dental Products     anesthetic  . Epinephrine     Review of Systems negative except from HPI and PMH  Physical Exam Well developed and well nourished in no acute distress HENT normal E scleral and icterus clear Neck Supple JVP flat; carotids brisk and full Clear to ausculation Regular rate and rhythm, no murmurs gallops or rub Soft with active bowel sounds No clubbing cyanosis and edema Alert and oriented, grossly normal motor and sensory function Skin Warm and Dry  ECG  Assessment and  Plan

## 2010-12-25 NOTE — Assessment & Plan Note (Signed)
Patient is without clinical recurrences of atrial fibrillation. She does have some bradycardia. We'll plan to decrease her amiodarone from 200x7 days to 200x5 days per week. We'll check her LFTs for surveillance. Her thyroids are being managed by Dr. Evlyn Kanner

## 2010-12-25 NOTE — Assessment & Plan Note (Signed)
The patient likely has dysautonomic syncope related to her lap band surgery. When they reinflated the band, syncope recurred.

## 2010-12-25 NOTE — Assessment & Plan Note (Signed)
We'll need Chronic anticoagulation

## 2010-12-26 LAB — HEPATIC FUNCTION PANEL
ALT: 34 U/L (ref 0–35)
Alkaline Phosphatase: 75 U/L (ref 39–117)
Bilirubin, Direct: 0.1 mg/dL (ref 0.0–0.3)
Total Bilirubin: 0.8 mg/dL (ref 0.3–1.2)
Total Protein: 6.6 g/dL (ref 6.0–8.3)

## 2010-12-31 ENCOUNTER — Telehealth: Payer: Self-pay | Admitting: Internal Medicine

## 2010-12-31 MED ORDER — PRAVASTATIN SODIUM 20 MG PO TABS: 20.0000 mg | ORAL_TABLET | Freq: Every day | ORAL | Status: DC

## 2010-12-31 NOTE — Telephone Encounter (Signed)
Patient is leaving town for vacation early Wednesday morning---please give her a "vacation exemption" and refill her pravastatin early---please call [facility] to [facility], [address], [place]

## 2011-01-01 ENCOUNTER — Other Ambulatory Visit: Payer: Self-pay

## 2011-01-01 ENCOUNTER — Encounter: Payer: Medicare Other | Admitting: *Deleted

## 2011-01-01 ENCOUNTER — Ambulatory Visit (INDEPENDENT_AMBULATORY_CARE_PROVIDER_SITE_OTHER): Payer: Medicare Other | Admitting: *Deleted

## 2011-01-01 DIAGNOSIS — I6350 Cerebral infarction due to unspecified occlusion or stenosis of unspecified cerebral artery: Secondary | ICD-10-CM

## 2011-01-01 DIAGNOSIS — Z7901 Long term (current) use of anticoagulants: Secondary | ICD-10-CM

## 2011-01-01 DIAGNOSIS — I4891 Unspecified atrial fibrillation: Secondary | ICD-10-CM

## 2011-01-01 MED ORDER — PRAVASTATIN SODIUM 20 MG PO TABS: 20.0000 mg | ORAL_TABLET | Freq: Every day | ORAL | Status: DC

## 2011-01-07 LAB — CBC
HCT: 39.8 % (ref 36.0–46.0)
Hemoglobin: 13.2 g/dL (ref 12.0–15.0)
MCHC: 32.9 g/dL (ref 30.0–36.0)
MCV: 87.8 fL (ref 78.0–100.0)
MCV: 87.8 fL (ref 78.0–100.0)
Platelets: 158 10*3/uL (ref 150–400)
RBC: 3.75 MIL/uL — ABNORMAL LOW (ref 3.87–5.11)
RDW: 15.1 % (ref 11.5–15.5)
WBC: 3.8 10*3/uL — ABNORMAL LOW (ref 4.0–10.5)
WBC: 6.1 10*3/uL (ref 4.0–10.5)

## 2011-01-07 LAB — BASIC METABOLIC PANEL
BUN: 12 mg/dL (ref 6–23)
BUN: 15 mg/dL (ref 6–23)
CO2: 26 mEq/L (ref 19–32)
CO2: 28 mEq/L (ref 19–32)
Calcium: 8.1 mg/dL — ABNORMAL LOW (ref 8.4–10.5)
Calcium: 8.3 mg/dL — ABNORMAL LOW (ref 8.4–10.5)
Calcium: 8.5 mg/dL (ref 8.4–10.5)
Chloride: 102 mEq/L (ref 96–112)
Chloride: 104 mEq/L (ref 96–112)
Creatinine, Ser: 0.94 mg/dL (ref 0.4–1.2)
Creatinine, Ser: 0.99 mg/dL (ref 0.4–1.2)
Creatinine, Ser: 1.14 mg/dL (ref 0.4–1.2)
GFR calc Af Amer: >60 mL/min (ref >60)
GFR calc Af Amer: >60 mL/min (ref >60)
Glucose, Bld: 109 mg/dL — ABNORMAL HIGH (ref 70–99)
Glucose, Bld: 113 mg/dL — ABNORMAL HIGH (ref 70–99)

## 2011-01-07 LAB — CARDIAC PANEL(CRET KIN+CKTOT+MB+TROPI)
CK, MB: 0.4 ng/mL (ref 0.3–4.0)
Relative Index: 0.4 (ref 0.0–2.5)
Relative Index: 0.6 (ref 0.0–2.5)
Total CK: 103 U/L (ref 7–177)
Troponin I: 0.01 ng/mL (ref 0.00–0.06)

## 2011-01-07 LAB — DIFFERENTIAL
Eosinophils Absolute: 0 10*3/uL (ref 0.0–0.7)
Eosinophils Relative: 0 % (ref 0–5)
Lymphocytes Relative: 13 % (ref 12–46)
Lymphs Abs: 0.8 10*3/uL (ref 0.7–4.0)
Monocytes Absolute: 0.8 10*3/uL (ref 0.1–1.0)

## 2011-01-07 LAB — POCT CARDIAC MARKERS
CKMB, poc: <1 ng/mL — ABNORMAL LOW (ref 1.0–8.0)
Myoglobin, poc: 75.7 ng/mL (ref 12–200)
Troponin i, poc: <0.05 ng/mL (ref 0.00–0.09)

## 2011-01-07 LAB — COMPREHENSIVE METABOLIC PANEL
AST: 21 U/L (ref 0–37)
BUN: 16 mg/dL (ref 6–23)
CO2: 25 mEq/L (ref 19–32)
Chloride: 103 mEq/L (ref 96–112)
Creatinine, Ser: 1.24 mg/dL — ABNORMAL HIGH (ref 0.4–1.2)
GFR calc Af Amer: 53 mL/min — ABNORMAL LOW (ref >60)
GFR calc non Af Amer: 44 mL/min — ABNORMAL LOW (ref >60)
Glucose, Bld: 139 mg/dL — ABNORMAL HIGH (ref 70–99)
Total Bilirubin: 0.8 mg/dL (ref 0.3–1.2)

## 2011-01-07 LAB — PROTIME-INR
INR: 1.7 — ABNORMAL HIGH (ref 0.00–1.49)
INR: 1.7 — ABNORMAL HIGH (ref 0.00–1.49)
INR: 2.3 — ABNORMAL HIGH (ref 0.00–1.49)
Prothrombin Time: 21.3 seconds — ABNORMAL HIGH (ref 11.6–15.2)
Prothrombin Time: 26.7 seconds — ABNORMAL HIGH (ref 11.6–15.2)
Prothrombin Time: 27.2 seconds — ABNORMAL HIGH (ref 11.6–15.2)

## 2011-01-07 LAB — LEGIONELLA ANTIGEN, URINE: Legionella Antigen, Urine: NEGATIVE

## 2011-01-07 LAB — CULTURE, BLOOD (ROUTINE X 2)
Culture: NO GROWTH
Culture: NO GROWTH

## 2011-01-07 LAB — STREP PNEUMONIAE URINARY ANTIGEN: Strep Pneumo Urinary Antigen: NEGATIVE

## 2011-01-07 LAB — SEDIMENTATION RATE: Sed Rate: 29 mm/hr — ABNORMAL HIGH (ref 0–22)

## 2011-01-07 LAB — BRAIN NATRIURETIC PEPTIDE: Pro B Natriuretic peptide (BNP): 219 pg/mL — ABNORMAL HIGH (ref 0.0–100.0)

## 2011-01-07 LAB — HEMOGLOBIN A1C: Hgb A1c MFr Bld: 6.4 % — ABNORMAL HIGH (ref 4.6–6.1)

## 2011-01-18 ENCOUNTER — Ambulatory Visit (INDEPENDENT_AMBULATORY_CARE_PROVIDER_SITE_OTHER): Payer: Medicare Other | Admitting: *Deleted

## 2011-01-18 ENCOUNTER — Encounter: Payer: Medicare Other | Admitting: *Deleted

## 2011-01-18 DIAGNOSIS — I4891 Unspecified atrial fibrillation: Secondary | ICD-10-CM

## 2011-01-18 DIAGNOSIS — I6350 Cerebral infarction due to unspecified occlusion or stenosis of unspecified cerebral artery: Secondary | ICD-10-CM

## 2011-02-05 NOTE — Assessment & Plan Note (Signed)
Kayla Harrison                         ELECTROPHYSIOLOGY OFFICE NOTE   NAME:Kayla Harrison                 MRN:          [id number]  DATE:07/24/2009                            DOB:          [birth year]    Kayla Harrison was seen in followup for atrial fibrillation which is  paroxysmal.  She has been diagnosed with breast cancer as of this  morning, she also has need for a colonoscopy because of precancerous  polyps.  She comes in today with questions regarding anticoagulation.   Her atrial fibrillation has been relatively sparse on 400 mg a day of  amiodarone.  She does have nocturnal palpitations which are regular not  as hard.  They are frequently precipitated by standing and relieved by  lying down, but takes about an hour or so to abate.   Her thromboembolic risk factors are notable for hypertension,  hyperglycemia, gender, age and prior stroke x2.   MEDICATIONS:  Her medication lists today include;  1. Aspirin 81.  2. Amiodarone 200 b.i.d.  3. Pravastatin.  4. Lasix.  5. Transderm.  6. Scopolamine.  7. Coumadin.  8. Levothyroxine.   PHYSICAL EXAMINATION:  VITAL SIGNS:  Her blood pressure is 124/80, her  pulse is 64 and regular.  LUNGS:  Clear.  NECK:  Veins were flat.  HEART:  Sounds were regular with an S4.  ABDOMEN:  Soft with active bowel sounds.  EXTREMITIES:  Had 1 to 2+ ankle edema.  SKIN:  Somewhat violaceous, it was warm and dry.   Electrocardiogram dated today demonstrated sinus rhythm at 64 with  intervals of 0.19/0.09/0.47.  The axis was 15 degrees.   IMPRESSION:  1. Paroxysmal atrial fibrillation.  2. History of pulmonary vein isolation by Dr. Macon Harrison 2 years ago;      history of atrial flutter ablation years ago.  3. Thromboembolic risk factors notable for;      a.     Age.      b.     Gender.      c.     Prior stroke.  4. Recent diagnosis of breast cancer.  5. Need for colonoscopy.   Kayla Harrison has a  high thromboembolic risk for repeat  thromboembolism given her atrial fibrillation and prior strokes.  To  that end, I think that treating her with heparin coverage would be most  appropriate.  Given that she has need for 2 procedures, one thought  would be that she could have her Coumadin held, go back on Lovenox, have  her breast lumpectomy done and then have her Coumadin resumed the night  before her breast surgery.   I will try and get up with Dr. Kinnie Harrison about this.  I have contacted his  office.  He is out of the office as of now.   I should note that her amiodarone surveillance laboratories are normal  and I appreciate Dr. Rinaldo Harrison help with this.     Kayla Salvia, MD, [facility]  Electronically Signed    SCK/MedQ  DD: 07/24/2009  DT: 07/25/2009  Job #: [id number]   cc:   Kayla Harrison.  Kayla Harrison, M.D.  Kayla Harrison, M.D.  Kayla Harrison, M.D.

## 2011-02-05 NOTE — Assessment & Plan Note (Signed)
[facility]                         ELECTROPHYSIOLOGY OFFICE NOTE   NAME:[name], [name]                 MRN:          [id number]  DATE:09/06/2008                            DOB:          15-Dec-1945    [name] is seen today as an add-on because of symptomatic atrial  fibrillation.  She is status post pulmonary vein isolation procedure  done by Dr. Macon Large down at [facility] which was associated with a year hiatus  from her atrial fibrillation.  I should note that her atrial  fibrillation has been longstanding, and she has taking multiple  antiarrhythmics for it and was complicated by small strokes, so she is  on long-term Coumadin.   Over recent months, she has had recurrent symptoms of atrial arrhythmias  occurring increasingly frequently and of longer duration and associated  with greater symptoms.  We initially tried flecainide; this was not  tolerated.  We put her on generic propafenone and she is taking 225  twice daily.  I spoke with her last week, and we chose to increase the  propafenone to 325 twice daily.  Unfortunately over the last 36-48  hours, she has had a recurrent episode of protracted atrial arrhythmia  and has become increasingly fatigued.  She has developed peripheral  edema.   She came in today for her INR, it turned out to low at 1.8.   Her exam was notable for stable vital signs.  Her heart rate was 85 and  irregular.  She had 2+ peripheral edema.   Her electrocardiogram dated today demonstrated atrial  flutter/fibrillation at 97 beats per minute with intervals of -  0.11/0.38.   IMPRESSION:  1. Paroxysmal atrial fibrillation and flutter following pulmonary vein      isolation procedure.  2. Coumadin therapy.  3. Diastolic heart failure - acute.   [name] has significantly symptomatic atrial arrhythmias.  She  is status post PVI as noted.   We have discussed treatment options, which would include:  1.  Continue on her propafenone.  2. Discontinue propafenone resuming amiodarone.  3. Discontinue propafenone and initiating Tikosyn.  4. Repeat pulmonary vein isolation procedure.   I am a little bit concerned about the QT prolongation on her  electrocardiogram and this may preclude Tikosyn therapy.  I will plan to  wait and see what the postcardioversion electrocardiogram looks like  before making a final decision.   In the interim, the plan will be to:  1. Discontinue her propafenone.  2. Begin Lasix 40 mg a day.  3. Begin potassium 20 mEq a day.  4. Anticipate TEE-guided cardioversion on Friday.  5. Tikosyn or amiodarone to follow anticipate TEE-guided cardioversion      depending on the QT interval.  6. She may need intercurrent higher doses of diltiazem for rate      control.     Duke Salvia, MD, [facility]  Electronically Signed    SCK/MedQ  DD: 09/06/2008  DT: 09/06/2008  Job #: [id number]

## 2011-02-05 NOTE — Discharge Summary (Signed)
NAMEJESSIAH, WOJNAR          ACCOUNT NO.:  [id number]   MEDICAL RECORD NO.:  [id number]          PATIENT TYPE:  INP   LOCATION:  2013                         FACILITY:  [facility]   PHYSICIAN:  Duke Salvia, MD, FACCDATE OF BIRTH:  11-Sep-1946   DATE OF ADMISSION:  06/15/2008  DATE OF DISCHARGE:  06/17/2008                               DISCHARGE SUMMARY   As far as his allergies goes, these are mostly intolerance, the patient  has had intolerance for SOTALOL, which has caused profound fatigue and  it had also failed to keep her atrial fibrillation intact.  She has had  trouble with AMIODARONE.  She had recurrent atrial fibrillation on  AMIODARONE and BETA-BLOCKER has also caused profound fatigue.   FINAL DIAGNOSES:  1. Discharging on the day of direct current cardioversion for      recurrent atrial fibrillation.  2. The patient admitted on June 15, 2008, for recurrent atrial      fibrillation.      a.     Symptoms of lightheadedness, dyspnea on exertion, and       palpitation.      b.     The patient started recently on flecainide and rate       controlled prior to cardioversion with IV Cardizem.   SECONDARY DIAGNOSES:  1. History of pulmonary vein isolation/atrial fibrillation ablation,      December 2008, [facility].  2. History of atrial fibrillation since 1995 at first paroxysmal but      then proceeding to persistent and threatening chronic at the      current time.  3. Atrial flutter, status post ablation, 2000.  4. Ejection fraction of 55%.  5. History of thyroid cancer, status post thyroidectomy.  6. Hypertension.  7. History of prior stroke, 2004.  8. Nephrolithiasis.  9. Weight loss, intentional in the last 9 months of 40 pounds.   PROCEDURES:  On June 17, 2008, direct current cardioversion for  recurrent atrial fibrillation.  The patient had been flecainide 36-48  hours prior to the cardioversion and rate control was effected with  IV  Cardizem.  The patient is now on oral cortisone, however, at 180 mg  twice daily.   BRIEF HISTORY:  [name] is a 65 year old female.  She has been  fighting atrial fibrillation since 1995.  For the first several years,  it was merely paroxysmal.  She has been on and off sotalol over those  years.  From 2006 to 2008, she was on amiodarone until it failed to hold  her in sinus rhythm.  Sotalol at all times has caused profound fatigue.   In 2000, she had electrophysiology study, radiofrequency, and  catheterization of atrial flutter.   Her symptoms are pronounced WHENEVER.  She is in atrial fibrillation.  She is lightheaded.  She has heart racing.  She has fatigue.  She has  dyspnea on exertion.  In the fall of 2008, having trouble staying in  sinus rhythm on amiodarone, Dr. Graciela Husbands recommended pulmonary vein  isolation at [facility].  She had this done in December 2008.  Before she left home, she was placed on sotalol.  She became short of  breath and was found to be in congestive heart failure having to be  readmitted to a hospital 3 days after her discharge from [facility].   Over a 69-month period following her atrial fibrillation ablation, she  has been free of atrial fibrillation.  She is acutely aware when she is  out of rhythm.  Transient bouts started in the last week of August.  They have become progressively frequent.  She has been wearing a  LifeWatch monitor.  She and the monitor agree that she has been locked  in since Monday, June 13, 2008.  Everyday since then, her symptoms  have become pronounced (usually, she is able to exercise without  hindrance).  Today, in atrial fibrillation, she was short of breath,  just walking from the parking lot to the hospital.  The patient is  admitted with initiation of flecainide dosing, to have cardioversion  after a 48-hour period of flecainide.  Her rate will be controlled on IV  Cardizem.    HOSPITAL COURSE:  The patient presents electively on June 15, 2008.  She has started her first dose of flecainide 100 mg the evening before  and the evening of June 14, 2008.  She continued dosing twice  daily.  She maintained an atrial fibrillation with moderately elevated  rates between 110 and 130, especially when she exercised or walked in  the hall.  She had IV Cardizem running at 10 mg per hour, which kept her  heart rate from boosting to 130 beats per minute.  On the morning of  June 17, 2008, she had the direct current cardioversion with 120  joules.  She went from atrial fibrillation to sinus rhythm.  She will  discharge merely hours after her cardioversion.   DISCHARGE MEDICATIONS:  1. Diltiazem 180 mg twice daily, this may be a new dose.  2. Flecainide 100 mg twice daily.  3. Coumadin 5 mg Monday, Wednesday, and Friday and 2.5 mg Tuesday,      Thursday, Saturday, and Sunday.  4. Levothyroxine 175 mcg daily except Sunday when she takes 1-1/2      tablets.  5. Enteric-coated aspirin 81 mg daily.  6. Vitamins C, E, and D.  7. Simvastatin 40 mg daily at night.   Will have an office visit with Dr. Graciela Husbands in 2-3 weeks.   LABORATORY STUDIES ON THIS ADMISSION:  On the day of discharge, her  protime is 36.7, INR of 3.4.  Her white cell count on this admission was  5.4, hemoglobin 13.2, hematocrit 39.6, and platelets are 184.  Sodium is  140, potassium 4.1, chloride 108, carbonate 27, BUN is 19, creatinine is  0.94, and glucose is 110.   Of note, the patient did have some minor irritation where the  cardioverting pads were replaced and she will have Cetaphil cream with  1% hydrocortisone lotion at discharge to apply as needed.  We will  recommend that she have her Coumadin dose today, just to take 2.5 mg  where she would take normally 5 today.      Maple Mirza, [place]      Duke Salvia, MD, [facility]  Electronically Signed    GM/MEDQ  D:  06/17/2008  T:   06/18/2008  Job:  [id number]   cc:   Dr. Limmie Patricia A. Evlyn Kanner, M.D.  Lacretia Leigh. Quintella Reichert, M.D.  Duke Salvia, MD, [facility]

## 2011-02-05 NOTE — Assessment & Plan Note (Signed)
[facility]                                 ON-CALL NOTE   NAME:Kayla Harrison, Kayla Harrison                 MRN:          [id number]  DATE:06/11/2008                            DOB:          1946/05/15    PRIMARY CARDIOLOGIST:  Duke Salvia, MD, [facility]   I was contacted by [facility] because of an abnormal EKG.  They stated  that [name] had had some paroxysmal atrial fibrillation.  They  stated that she had been symptomatic with this.  They stated that when  they contacted her and did a retest, her tracing was now sinus rhythm in  the 80s.  She was still complaining of some fatigue, but was not having  chest pain or shortness of breath.  I requested that they confirm sinus  rhythm with another communication and they stated they would do so.  I  requested that she continue all of her home medications and I will put  her on the list to be contacted for a followup appoint.      Theodore Demark, PA-C  Electronically Signed      Pricilla Riffle, MD, [facility]  Electronically Signed   RB/MedQ  DD: 06/11/2008  DT: 06/11/2008  Job #: [id number]

## 2011-02-05 NOTE — Assessment & Plan Note (Signed)
[facility]   NAME:  Kayla Harrison, Kayla Harrison          ACCOUNT NO.:  [id number]   MEDICAL RECORD NO.:  [id number]      DATE OF BIRTH:  1946-06-11   PHYSICIAN:  Madolyn Frieze. Jens Som, MD, [facility] VISIT DATE:  09/09/2008                                   OFFICE VISIT   This is cardioversion of atrial flutter.  The patient was sedated with  propofol 70 mg intravenously.  Synchronized cardioversion with 120  joules (biphasic) resulted in a normal sinus rhythm.  There were no  immediate complications.  We would recommend continuing Coumadin,  following up in the Coumadin Clinic as scheduled and follow up with Dr.  Graciela Husbands.      Madolyn Frieze Jens Som, MD, [facility]     BSC/MEDQ  D:  09/09/2008  T:  09/10/2008  Job:  [id number]
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
## 2011-02-05 NOTE — Letter (Signed)
November 01, 2008    Duke Salvia, MD, [facility]  [address]  [place], [place] [address]   RE:  TRENIECE, HOLSCLAW  MRN:  [id number]  /  DOB:  Nov 20, 1945   Dear Brett Canales:   It was my pleasure to see Jaslynne Dahan [facility] in electrophysiology  followup today.  As you recall, she is a pleasant 65 year old female  with persistent atrial fibrillation and atypical atrial flutter status  post prior atrial fibrillation ablation at [facility] in December 2008.  Upon  last being seen in my clinic, she had a supratherapeutic INR of 6.8 with  hematuria, fatigue, and dizziness at that time.  She subsequently had  her Coumadin dose decreased to maintain a therapeutic level INR.  She  reports that her hematuria has resolved.  However, she continues to have  occasional dark urine.  She denies frank hematuria, however.  Her  dizziness, fatigue, and lethargy have all resolved and she feels much  better than she has in quite a while.  She reports having only three  short episodes of atrial fibrillation, which also spontaneously  terminated since last being hospitalized.  She is actually quite pleased  with medical therapy for her atrial fibrillation at this time.  She  denies chest pain, shortness of breath, presyncope, syncope, or other  concerns.  She is pleased that her pneumonia has also resolved.   PROBLEM LIST:  1. Persistent atrial fibrillation and atypical atrial flutter status      post atrial fibrillation ablation at [facility] in December 2008 with      recurrent medically refractory symptoms.  2. Hypothyroidism status post thyroidectomy.  3. Cerebrovascular accident in 2004.  4. Hyperlipidemia.  5. Recent hematuria secondary to a supratherapeutic INR, which appears      to have resolved.  6. Recent hospitalization for pneumonia, which appears to have      resolved.   CURRENT MEDICATIONS:  1. Aspirin 81 mg daily.  2. Synthroid 125 mcg daily.  3. Coumadin to maintain an INR between 2 and  3.  4. Simvastatin 40 mg daily.  5. Diltiazem 180 mg daily.  6. Lasix 40 mg daily.  7. Potassium chloride 20 mEq daily.  8. Bumex daily.  9. Amiodarone 200 mg b.i.d.   PHYSICAL EXAMINATION:  VITALS:  Blood pressure 120/70, heart rate 96,  respirations 18, weight 266 pounds.  GENERAL:  The patient is a well-appearing female in no acute distress.  She is obese, alert and oriented x3.  HEENT:  Normocephalic, atraumatic.  Sclerae clear.  Conjunctivae pink.  Oropharynx clear.  NECK:  Supple.  No thyromegaly, JVD, or bruits.  LUNGS:  Clear to auscultation bilaterally.  HEART:  Regular rate and rhythm.  No murmurs, rubs, or gallops.  GASTROINTESTINAL:  Soft, nontender, nondistended.  Positive bowel  sounds.  EXTREMITIES:  No clubbing, cyanosis, or edema.  NEUROLOGIC:  Strength and sensation are intact.  SKIN:  No ecchymosis or laceration.  MUSCULOSKELETAL:  No deformity or atrophy.  PSYCH:  Euthymic mood, anxious affect.   LABORATORY DATA:  1. Amiodarone level 0.75 (range 0.522-2 mcg per mL).  2. Desethylamiodarone level 0.60 (limits 0.25-3.50 mcg per mL).   IMPRESSION:  Kayla Harrison presents today for followup after a recent  visit during which she was noted be orthostatic, fatigued, and had  hematuria in the setting of the supratherapeutic INR.  As her INR is now  back into the therapeutic range, the symptoms have all resolved  and she  is doing quite well.  Surprisingly, she has had remarkable improvements  with her atrial fibrillation with amiodarone therapy appears to be  tolerating this at this time.  Presently, she wishes to continue her  current medicine strategy.  A recent amiodarone level reveals that her  amiodarone dose is optimal at this time.  I have, therefore, continued  her on 200 mg twice daily.  She should also continue Coumadin to  maintain her INR between 2 and 3.  I have think that for the next few  months we should continue this strategy.  Should she have  increasing  frequency and durations of symptomatic atrial fibrillation or  intolerance to amiodarone, then we could consider catheter ablation down  the road.  I think that she is at high risk for recurrence of atrial  fibrillation even after catheter ablation given her moderate enlargement  of her left atrium, medical refractory disease despite a prior ablation,  and requirements of amiodarone long term.  She will follow up with [place] as  needed.  I have asked her to come back in 10 weeks for TFTs, LFTs, and a  repeat amiodarone level.   PLAN:  1. Continue amiodarone 200 mg twice daily.  2. Coumadin to maintain an INR between 2 and 3.  3. BMET, LFTs, TFTs, and amiodarone level in 10 weeks.   Brett Canales, thank you for the opportunity of participating in the care of Ms.  Mee Harrison.  She will continue to follow up with you or contact me if  she wishes to proceed with catheter ablation.    Sincerely,      Hillis Range, MD  Electronically Signed    JA/MedQ  DD: 11/01/2008  DT: 11/02/2008  Job #: [id number]

## 2011-02-05 NOTE — Assessment & Plan Note (Signed)
[facility]                         ELECTROPHYSIOLOGY OFFICE NOTE   NAME:Kayla Harrison, HUNTSBERRY                 MRN:          [id number]  DATE:04/08/2007                            DOB:          [birth year]    [name] comes in.  She has atrial fibrillation and has had it  off and on for years.  She has been on Pacerone for the last year and a  half.  She thinks her atrial fibrillation is less disruptive than it  was.   Because of her symptoms, we had utilized an event recorder which  demonstrated atrial fibrillation as well as a __________  rhythm with a  ventricular response in the 120-140 range.  She also had some PACs.   We had a lengthy discussion today regarding rate control, therapeutic  options for her atrial fibrillation, specifically for consideration of  pulmonary vein isolation.  She will look into her insurance to see which  of the local hospitals may be covered in her network.  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  I suggested  that taking care of her symptoms, either by augmented rate control,  which we will begin today by adding Toprol back to her amiodarone or  some more definitive procedure would be important before she leaves.   We will see her again in four months time, but I will be in touch with  her in the interim.     Duke Salvia, MD, [facility]  Electronically Signed    SCK/MedQ  DD: 04/08/2007  DT: 04/09/2007  Job #: [id number]

## 2011-02-05 NOTE — Procedures (Signed)
NAME:  Kayla Harrison, Kayla Harrison NO.:  [id number]   MEDICAL RECORD NO.:  [id number]          PATIENT TYPE:  OUT   LOCATION:  SLEEP CENTER                 FACILITY:  [facility]   PHYSICIAN:  Barbaraann Share, MD,FCCPDATE OF BIRTH:  [birth year] 07, 1946   DATE OF STUDY:  08/10/2007                            NOCTURNAL POLYSOMNOGRAM   REFERRING PHYSICIAN:  Duke Salvia, MD, [facility]   INDICATION FOR STUDY:  Hypersomnia with sleep apnea.   EPWORTH SLEEPINESS SCORE:  Is 1.   MEDICATIONS:   SLEEP ARCHITECTURE:  The patient had a total sleep time of 281 minutes  with no slow wave sleep, and only 54 minutes of REM.  Sleep onset  latency was normal.  REM onset was at the upper limits of normal.  Sleep  efficiency was decreased at 76%.   RESPIRATORY DATA:  The patient was found to have 55 obstructive  hypopneas and no apneas, for an apnea to hypopnea index of 12 events per  hour.  The events were not positional and there was very loud snoring  noted throughout.  The patient did not meet split night criteria,  secondary to most of her obstructive events occurred well after 1 a.m.   OXYGEN DATA:  There was O2 desaturation as low as 77% with the patient's  obstructive events.   CARDIAC DATA:  No clinically-significant arrhythmias were noted.   MOVEMENT/PARASOMNIA:  None.   IMPRESSION/RECOMMENDATIONS:  1. Mild obstructive sleep apnea/hypopnea syndrome with an      apnea/hypopnea index of 12 events per hour and O2 desaturation as      low as 77%.  The patient did not meet split night protocol,      secondary to the majority of her      events occurring after 1 a.m.  Treatment for this degree of sleep      apnea can include weight loss alone if applicable, upper airway      surgery, oral appliance and also CPAP.  Clinical correlation is      suggested.      Barbaraann Share, MD,FCCP  Diplomate, American Board of Sleep  Medicine  Electronically Signed     KMC/MEDQ  D:  08/18/2007  10:12:49  T:  08/18/2007 11:12:52  Job:  [id number]

## 2011-02-05 NOTE — Assessment & Plan Note (Signed)
[facility]   NAME:  Kayla Harrison, Kayla Harrison          ACCOUNT NO.:  [id number]   MEDICAL RECORD NO.:  [id number]      DATE OF BIRTH:  [birth year]   PHYSICIAN:  Madolyn Frieze. Jens Som, MD, [facility] VISIT DATE:  09/09/2008                                   OFFICE VISIT   This is cardioversion of atrial flutter.  The patient was sedated with  propofol 70 mg intravenously.  Synchronized cardioversion with 120  joules (biphasic) resulted in a normal sinus rhythm.  There were no  immediate complications.  We would recommend continuing Coumadin,  following up in the Coumadin Clinic as scheduled and follow up with Dr.  Graciela Husbands.      Madolyn Frieze Jens Som, MD, [facility]  Electronically Signed     BSC/MEDQ  D:  09/09/2008  T:  09/10/2008  Job:  [id number]

## 2011-02-05 NOTE — Discharge Summary (Signed)
NAMEBRYAR, Kayla Harrison          ACCOUNT NO.:  [id number]   MEDICAL RECORD NO.:  [id number]          PATIENT TYPE:  INP   LOCATION:  3734                         FACILITY:  [facility]   PHYSICIAN:  Valerie A. Felicity Coyer, MDDATE OF BIRTH:  08-13-1946   DATE OF ADMISSION:  09/28/2008  DATE OF DISCHARGE:  10/04/2008                               DISCHARGE SUMMARY   PRIMARY CARE PHYSICIAN:  Titus Dubin. Alwyn Ren, MD, FACP, FCCP   CARDIOLOGIST:  Hillis Range, MD   ELECTROPHYSIOLOGIST:  Duke Salvia, MD, [facility]   DISCHARGE DIAGNOSES:  1. Pneumonia.  2. History of hypothyroidism with decreased TSH on this admission.  3. History atrial fibrillation/atrial flutter.   HISTORY OF PRESENT ILLNESS:  [name] is a 65 year old white  female with past medical history of atrial fibrillation with recent  ablation, done at [facility], who presented to primary care  physician's office on day of admission with reports of cough and  congestion.  Upon evaluation in the office, the patient was found to  have a temperature of 99.2 with a decreased right lower lobe breath  sounds.  In addition, the patient's heart rate found to be irregular  with a rate into the 140s.  The patient was sent to the emergency room  at that time for further evaluation and treatment and subsequent  admission.   PAST MEDICAL HISTORY:  1. History of AFib with recent ablation done at [facility].  2. History of CVA in 2004.  3. Dyslipidemia.  4. History of thyroid cancer status post thyroidectomy in 1998.  5. Status post hysterectomy in 1983.  6. Status post cholecystectomy.  7. Status post tonsillectomy.   CONSULTATIONS DURING THIS ADMISSION:  1. Kayla Harrison.  2.  Pulmonology, Oretha Milch, MD   PROCEDURES DONE DURING THIS HOSPITALIZATION:  1. Chest x-ray, done on September 28, 2008, revealing right middle lobe      pneumonia.  2. Chest x-ray, done on September 30, 2008,  revealing worsening of      bilateral lobe pneumonia and right middle lobe pneumonia.  3. CT of the chest done on October 02, 2008, revealing ground-glass      nodular densities in peribronchovascular distribution.   COURSE OF HOSPITALIZATION:  1. Pneumonia:  Again, the patient was admitted to the office with      right middle lobe infiltrate noted on chest x-ray with worsening      chest x-ray followup.  Due to slow response to antibiotic      treatment, the patient was seen in consultation by [facility]      Pulmonology.  With worsening chest x-ray, a CT of the chest was      obtained with findings as listed above.  Pulmonology has      recommended continuing pulmonary toilet as well as antibiotics.      Low suspicion for amiodarone toxicity at this time given response      to current medical management.  The patient will follow up with Dr.      Vassie Loll in office next week for  a repeat chest x-ray to ensure      clearing of pneumonia.  2. Atrial fibrillation/atrial flutter:  Again, the patient with rapid      ventricular rate at the time of admission, well controlled with      oral medications.  The patient is to continue medications including      amiodarone and Coumadin at the time of discharge.  INR is      therapeutic at the time of dictation.  3. History of hypothyroidism with decreased TSH on this admit:  The      patient's Synthroid dose adjusted accordingly.  The patient will      need a 6- to 8-week followup with primary care physician for a      repeat TSH and further adjustment of medication.   MEDICATIONS AT THE TIME OF DISCHARGE:  1. Avelox 400 mg p.o. daily until gone.  2. Tessalon Perles 100 mg p.o. t.i.d. p.r.n. cough.  3. Synthroid 125 mcg p.o. daily, note this is a new dose.  4. Mucinex DM 600 mg p.o. b.i.d. p.r.n. congestion.  5. Amiodarone 200 mg p.o. t.i.d.  6. Pravastatin 20 mg p.o. daily.  7. Coumadin 5 mg p.o. on Monday and Friday.  8. Coumadin 2.5 mg p.o. on  Sunday, Tuesday, Wednesday, Thursday, and      Saturday.  9. Aspirin 81 mg p.o. daily.   DISPOSITION:  The patient felt medically stable for discharge home at  this time.  The patient has been ambulated on room air with O2  saturations remaining above 95%; therefore, no indication for home  oxygen therapy.  The patient will follow up with Dr. Vassie Loll at [facility]  Pulmonary on Tuesday, October 11, 2008, at 2:15 p.m. at which time chest  x-ray will be repeated to ensure clearing of the patient's pneumonia.  In addition, the patient is scheduled for a followup with Dr. Johney Frame on  October 13, 2008, to discuss repeat AFib ablation.      Cordelia Pen, NP      Raenette Rover. Felicity Coyer, MD  Electronically Signed    LE/MEDQ  D:  10/04/2008  T:  10/05/2008  Job:  [id number]   cc:   Titus Dubin. Alwyn Ren, MD,FACP,FCCP  Hillis Range, MD  Duke Salvia, MD, [facility]  Oretha Milch, MD

## 2011-02-05 NOTE — Assessment & Plan Note (Signed)
[facility]                         ELECTROPHYSIOLOGY OFFICE NOTE   NAME:Kayla Harrison, Kayla Harrison                 MRN:          [id number]  DATE:09/28/2007                            DOB:          03/15/1946    Kayla Harrison comes in following her catheter ablation and  pulmonary ventilation undertaken by Dr. Val Riles at [facility] on  September 08, 2007.  This was complicated about a week after her  procedure with the development of heart failure, prompting  hospitalization, diuresis, and treatment for a UTI.  She is now feeling  much better with some residual peripheral edema.   She brings with her a report also of her Myoview scan that was  suggestive of anterior ischemia that was mild versus breast attenuation.  With her habitus, it was felt that the latter was the more likely.   She is currently feeling significantly fatigued.  She previously failed  sotalol because of similar reasons, and she was discharged on sotalol 40  b.i.d.  She also takes Coumadin, Lasix 40, and Synthroid 175.   PHYSICAL EXAMINATION:  VITAL SIGNS:  Blood pressure was 135/82 with a  pulse of 61.  LUNGS:  Clear.  HEART:  Heart sounds were regular.  NECK:  Neck veins were flat.  EXTREMITIES:  1+ peripheral edema.   IMPRESSION:  1. Persistent atrial fibrillation status post pulmonary vein      isolation.  2. Congestive heart failure complicated in the postoperative course.  3. Fatigue on sotalol.  4. Treated hypothyroidism previously on amiodarone?  5. Abnormal Myoview scan versus breast attenuation.   We will plan to continue her Lasix for about one more week.   Will discontinue her sotalol.   We will plan not to pursue her Myoview scan, as it likely is related to  breast attenuation.  We will see her again in 6 months' time, and she  will be following up with up with Dr. Mont Dutton team in the next couple  of weeks.     Duke Salvia, MD, [facility]  Electronically Signed    SCK/MedQ  DD: 09/28/2007  DT: 09/29/2007  Job #: [id number]

## 2011-02-05 NOTE — Letter (Signed)
October 13, 2008    Duke Salvia, MD, [facility]  [address]  [address]  [place], [place] [address]   RE:  AMA, MCMASTER  MRN:  [id number]  /  DOB:  1945-12-30   Dear Kayla Harrison,   It was my pleasure to see Kayla Harrison [facility] in electrophysiology  followup today.  As you recall, she is a pleasant 65 year old female  with refractory atrial fibrillation and atypical atrial flutter.  She is  status post prior atrial fibrillation ablation at [facility] in December 2008.  She maintained sinus rhythm shortly following the procedure, but has  returned to heavily symptomatic atrial fibrillation and atypical atrial  flutter despite medical therapy with escalating doses of amiodarone.  She was recently hospitalized with pneumonia and appears to be  recovering.   She reports increasing symptoms of fatigue and lethargy since hospital  discharge.  She also reports symptoms of dizziness upon standing.  She  notes that her cough has resolved, and she denies chest pain or  shortness of breath.  She denies any symptomatic atrial fibrillation  over the past week and presents in sinus rhythm today.  She has had  hematuria since hospital discharge which she describes as quite  profound.  She denies anemia, so she denies flank pain or other symptoms  with this.   PROBLEM LIST:  1. Medically refractory persistent atrial fibrillation and atypical      atrial flutter status post ablation at [facility] in December 2008.  2. Hypothyroidism status post thyroidectomy.  3. Cerebrovascular accident in 2004.  4. Recent hospitalization for pneumonia.  5. Dysuria (as above).  6. Dyslipidemia.   CURRENT MEDICATIONS:  1. Aspirin 81 mg daily.  2. Synthroid 125 mcg daily.  3. Coumadin.  4. Simvastatin 40 mg daily.  5. Diltiazem 180 mg daily.  6. Lasix 40 mg daily.  7. Potassium 20 mEq daily.  8. Bumex daily.  9. Amiodarone 200 mg twice daily.   PHYSICAL EXAMINATION:  VITALS:  Blood pressure 98/60, heart rate 76,  respirations 18, weight 251 pounds.  Today, blood pressure lying 128/78  with a heart rate of 59; sitting 119/74, heart rate 64; and standing  blood pressure 109/69, heart rate 69 with symptomatic dizziness.  GENERAL:  The patient is a morbidly obese female in no acute distress.  She is alert and oriented x3.  HEENT:  Normocephalic, atraumatic.  Sclerae clear.  Conjunctivae pink.  Oropharynx clear.  NECK:  Supple.  No thyromegaly, lymphadenopathy, or bruits.  She has a  well-healed surgical scar over the thyroid.  LUNGS:  Clear to auscultation bilaterally.  HEART:  Regular rate and rhythm.  No murmurs, rubs, or gallops.  GI:  Soft, nontender, nondistended.  Positive bowel sounds.  EXTREMITIES:  No clubbing, cyanosis, or edema.  NEUROLOGIC:  Strength and sensation are intact.  SKIN:  No ecchymosis or lacerations.  MUSCULOSKELETAL:  No deformity or atrophy.  PSYCHIATRIC:  Euthymic mood, full affect.   IMPRESSION:  Kayla Harrison is a pleasant 65 year old female with  medical refractory persistent atrial fibrillation and atypical atrial  flutter status post prior atrial ablation procedure at [facility] in December  2008, who presents today for further EP followup.  She has had  progressive symptoms of fatigue and is orthostatic on exam today.  She  also reports recent hematuria.  I think that these warrant further  investigation and I have therefore ordered a CBC, INR, and basic  metabolic profile today.  If the  patient's INR is supratherapeutic, then  I think that our strategy should be to maintain an INR between 2 and 3.  If she, however, has an INR within the therapeutic range, than I have  instructed her to follow up with her urologist for further evaluation of  her hematuria.  I have also encouraged her to increase her oral intake  as she is hypovolemic and orthostatic by exam today.  She appears to be  maintaining sinus rhythm on amiodarone 200 mg twice daily.  I will order  an  amiodarone level today.  I reviewed the patient's echocardiogram from  December which reveals a moderately enlarged left atrium.  I think that  this moderately enlarged left atrium, medically refractory atrial  fibrillation, and failed prior catheter ablation all significantly  decrease her expected benefits from catheter ablation.  I have informed  her today that I suspect that her success rate from repeat catheter  ablation is probably on the order of 50-60%.  Risks, benefits, and  alternatives to EP study and radiofrequency ablation for atrial  fibrillation were again discussed in detail today.  The patient will  contemplate her options which would include continuing amiodarone long  term or repeat catheter ablation.  I have asked her to follow up with me  in clinic in 3 weeks for further discussion.   PLAN:  1. CBC, BMET, INR, and the amiodarone levels were drawn today.  2. The patient will follow up with Urology for further evaluation of      hematuria.  3. The patient will return to clinic in 3 weeks for further discussion      of catheter ablation for atrial fibrillation.   Thank you for the opportunity of participating in the care of Kayla Harrison.  Please feel free to contact me if I can be of further  assistance.    Sincerely,      Hillis Range, MD  Electronically Signed    JA/MedQ  DD: 10/13/2008  DT: 10/14/2008  Job #: [id number]

## 2011-02-05 NOTE — Letter (Signed)
July 15, 2007    Dr. Val Riles  [facility]  Department of Electrophysiology  [facility] [id number]  [place], [place]  [address]   RE:  Kayla Harrison, Kayla Harrison  MRN:  [id number]  /  DOB:  1946/02/01   Dear [name]:   I hope this letter finds you well.  I will have spoken to you before  about Kayla Harrison.  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   She has a history of atrial arrhythmias for which she underwent a  catheter-based procedure in 2001 or so.  I do not have those data  immediately available but the notes from 2000 demonstrated both atypical  flutter as well as a history of atrial fibrillation.  She underwent a  catheter-based procedure and had some significant improvement from what  must have been atrial tachycardia ablation, but then developed  recurrence problems with atrial fibrillation.  This occurred while she  was in [place].  There is some question about therapy.  She ultimately  ended up on Coumadin and amiodarone.  By scanning, she had some strokes  and so these medications have been continued.  The amiodarone had worked  for some time controlling her symptoms.  It is no longer doing that and  she now has an opportunity to pursue more definitive therapy with  consideration of pulmonary vein isolation.   She also has a history remotely of thyroid cancer and has developed a  fullness in her thyroid for which she is supposed to see the  endocrinologist in a couple of weeks.  Her TSH not withstanding has been  well regulated.   MEDICATIONS:  1. Synthroid 125 mcg.  2. Amiodarone 300 mg a day.  3. Pravachol.  4. Toprol 50.  5. Aspirin 81.   PHYSICAL EXAMINATION:  VITAL SIGNS:  Her blood pressure is 151/87.  Her  pulse is 65.  Her weight was 289.  This blood pressure is  significantly  elevated.  LUNGS:  Clear today.  HEART:  Sounds were regular without significant murmurs.  There was an  S4.  NECK:  The neck veins were flat.  The carotids were brisk and full.  EXTREMITIES:  Had 1 to 2 plus edema which is untoward for her.  NEUROLOGIC:  Grossly normal.   Event recording strips from June demonstrated multiple episodes of  atrial arrhythmias interspersed with sinus rhythm.   IMPRESSION:  1. Recurrent atrial fibrillation with a prior atrial      tachycardia/flutter ablation.  2. Thromboembolic risk factors notable for:      a.     Hypertension.      b.     Prior stroke.  3. Remote history of thyroid cancer with recurrent fullness in her      neck with evaluation pending.  4. Exogenous obesity.   [name], [name] would like to think with you about something more  definitive for control of her atrial fibrillation symptoms as the  amiodarone is not doing the trick.  With her prior thromboembolic  history, I am not sure whether pulmonary vein isolation would allow her  to come off Coumadin, though that might be the case with long term  monitoring.  I have to defer  that to your expertise.  Also the  issue of timing, vis-a-vi her thyroid  evaluation is important and we will get those records to you as they  come forth.   [name], thanks again for your help.    Sincerely,      Duke Salvia, MD, [facility]  Electronically Signed    SCK/MedQ  DD: 07/15/2007  DT: 07/16/2007  Job #: [id number]   CC:    Porfirio Mylar, Dr.

## 2011-02-05 NOTE — Assessment & Plan Note (Signed)
[facility]                         ELECTROPHYSIOLOGY OFFICE NOTE   NAME:VAN LATANJA, LEHENBAUER                 MRN:          [id number]  DATE:07/07/2008                            DOB:          Oct 12, 1945    [name] is seen in followup for atrial fibrillation.  The atrial  flutters are occurring in the context of her previous PVI, by Dr.  Macon Large done at [facility]. It has been very problematic, and it is associated  with a rapid ventricular response.  We tried flecainide and this had to  be terminated because of visual side effects.  She is not taking  propafenone.  She was briefed and informed about  dronedarone and I told  her my two cents which is that I am not sure about but I am not sure it  is worth two cents.   We discussed rate control versus rhythm control strategies.   She also told to me about a recent fall, which has left her very sore on  her right side and her right shoulder.   CURRENT MEDICATIONS:  1. Synthroid.  2. Coumadin.  3. Simvastatin.  4. Diltiazem 180.   Her INR is 2.4.   On examination today, her blood pressure is 140/80 and her pulses 76.  LUNGS:  Clear.  HEART:  Sounds are regular.  EXTREMITIES:  Without edema.  There was ecchymosis over her right knee.  There was some swelling of her right knee.   Electrocardiogram demonstrated sinus rhythm with normal intervals.   IMPRESSION:  1. Paroxysmal atrial fibrillation and flutter following peripheral      vascular insufficiency.  2. Recent fall.  3. Coumadin therapy.  4. Obesity with family history of diabetes.   [name] has recurrent atrial arrhythmias.  Rhythm control versus  rate control options were discussed.  She would like to pursue rate  control, and today she would like first to try increasing her dose of  diltiazem, which we will do from 180 once a day to twice a day.  She is  to  let us know how she does with this alternative beta-blockers  might be  appropriate at that point,  either in addition or as a replacement.   We will see her again in 3-4 months.     Duke Salvia, MD, [facility]  Electronically Signed    SCK/MedQ  DD: 07/07/2008  DT: 07/08/2008  Job #: [id number]

## 2011-02-05 NOTE — Consult Note (Signed)
Kayla Harrison, Kayla Harrison          ACCOUNT NO.:  [id number]   MEDICAL RECORD NO.:  [id number]          PATIENT TYPE:  INP   LOCATION:  3734                         FACILITY:  [facility]   PHYSICIAN:  Kayla Range, MD       DATE OF BIRTH:  03-31-1946   DATE OF CONSULTATION:  DATE OF DISCHARGE:                                 CONSULTATION   CONSULTING PHYSICIAN:  Kayla Perla, DO with [facility].   REASON FOR CONSULTATION:  Atrial fibrillation with rapid ventricular  rates.   HISTORY OF PRESENT ILLNESS:  [name] is a pleasant 65 year old  female with a history of hypertension, cerebrovascular disease, obesity,  and persistent atrial fibrillation who is admitted to [facility] with new onset pneumonia as well as atrial fibrillation with  rapid ventricular rates.  The patient's history of atrial arrhythmias  began before 2000.  She was diagnosed with atrial flutter by Dr. Sherryl Harrison and underwent cavotricuspid isthmus ablation in 2000.  She  subsequently developed increasing frequency and duration of palpitations  and was diagnosed with progressive atrial fibrillation.  She failed  medical therapy and therefore underwent EP study and radiofrequency  ablation for atrial fibrillation by Dr. Val Harrison at [facility] in December of 2008.  She reports maintaining  sinus rhythm until September of 2009.  She subsequently presented with  atrial fibrillation requiring cardioversion.  Since that time, she has  again had increasing frequency and duration of symptomatic palpitations.  She has been documented to have recurrent atrial fibrillation as well as  atypical atrial flutter with rapid ventricular rates.  Most recently,  she presented to [facility] for cardioversion on September 09, 2008.  She has failed medical therapy with flecainide, propafenone, and  most recently amiodarone.  She reports near daily episodes of  palpitations and heart racing secondary to atrial fibrillation.  She  notes that these episodes will occur if she lies on her left side.  She  has had difficulty with rate control despite medical therapy with  diltiazem.  She presented to her primary care physician's office today  for further evaluation of cough and shortness of breath and was found to  have atypical atrial flutter with 2:1 conduction and heart rates in the  150s.  She therefore was referred to [facility]  with plan for admission to the hospitalist service.  The patient notes  that over the past three days she has had subjective fevers and chills  as well as a cough productive of yellow sputum.  She also notes  worsening shortness of breath.  She denies chest discomfort, presyncope,  syncope, or other concerns.   PAST MEDICAL HISTORY:  1. Hypertension.  2. Hyperlipidemia.  3. Morbid obesity.  4. Cerebrovascular disease status post prior stroke in 2004.  5. Thyroid cancer status post thyroidectomy in 1999.  6. Persistent atrial fibrillation (as above).  7. Nephrolithiasis.  8. Preserved ejection fraction with a moderately enlarged left atrium      and mild mitral regurgitation.   HOME MEDICATIONS:  1. Amiodarone 200 mg t.i.d.  2. Diltiazem CR 180 mg b.i.d.  3. Coumadin 2 mg daily except 3 mg on Monday and Friday.  4. Simvastatin 40 mg daily.  5. Levothyroxine 175 mcg daily.   ALLERGIES:  She reports intolerance to FLECAINIDE due to visual  disturbances.  She also does not tolerate EPINEPHRINE with LIDOCAINE.   SOCIAL HISTORY:  The patient is single.  She denies tobacco, alcohol, or  drug use.   FAMILY HISTORY:  She denies significant family history of atrial  arrhythmias.   REVIEW OF SYSTEMS:  All systems are reviewed and negative except as  outlined in the HPI above.   PHYSICAL EXAMINATION:  VITAL SIGNS:  Blood pressure 120/58, heart rate  80, respirations 18, sats 96% on room air, and  temperature 98.7.  GENERAL:  The patient is an obese female in no acute distress.  She is  alert and oriented x3.  HEENT:  Normocephalic and atraumatic.  Sclerae clear.  Conjunctivae  pink.  Oropharynx clear.  NECK:  Supple.  No JVD, lymphadenopathy, or bruits.  A well-healed  surgical scar is present status post thyroidectomy.  LUNGS:  Coarse breath sounds throughout with rhonchi prominently at the  right base.  Normal work of breathing.  However, the patient has  frequent cough with deep inspiration.  HEART:  Regular rate and rhythm.  No murmurs, rubs, or gallops.  GASTROINTESTINAL:  Soft, nontender, nondistended.  Positive bowel  sounds.  EXTREMITIES:  No clubbing, cyanosis, or edema.  NEUROLOGIC:  Cranial nerves II through XII are intact except for mild  anisocoria.  Strength and sensation are intact.  SKIN:  No ecchymosis or laceration.  MUSCULOSKELETAL:  No deformity or atrophy.  PSYCHIATRIC:  Euthymic mood.  Flat affect.   EKG reveals atypical atrial flutter with 2:1 conduction and a  ventricular rate of 150 beats per minute, otherwise unremarkable.   Chest x-ray reveals a right lower lobe infiltrate.   LABORATORY DATA:  Pending.   IMPRESSION:  [name] is a pleasant 65 year old female with a  history of obesity, hypertension, hyperlipidemia, and persistent atrial  fibrillation who now presents with newly diagnosed pneumonia.  She  presented with atypical atrial flutter with 2:1 conduction and rapid  ventricular rates.  However, she is spontaneously converted to sinus  rhythm.  I think that her atrial arrhythmias are chronic and  longstanding and her recent rapid ventricular rates are likely secondary  to her pneumonia.  She is presently anticoagulated with Coumadin.  She  has failed medical therapy with multiple antiarrhythmic medications  including flecainide, propafenone, and amiodarone.   PLAN:  Therapeutic strategies for atrial fibrillation including both   medicine and catheter based therapies were again discussed with the  patient today.  Risks, benefits, and alternatives to EP study and  radiofrequency ablation for atrial fibrillation were also discussed.  The patient would like to consider catheter ablation for atrial  fibrillation once her respiratory issues have resolved.  I think at the  present time the best strategy would be to continue her on her present  regimen of amiodarone 200 mg t.i.d. as well as Cardizem CD 180 mg twice  daily.  If she develops rapid ventricular  rates, then we may need to place her on a diltiazem drip briefly.  However, for the most part we should continue her home regimen.  We  should also continue her home Coumadin dose to maintain an INR between 2  and 3.  Treatment of  the patient's pneumonia is deferred to the primary  team.      Kayla Range, MD  Electronically Signed     JA/MEDQ  D:  09/28/2008  T:  09/29/2008  Job:  [id number]

## 2011-02-05 NOTE — Op Note (Signed)
NAMESOFIYA, EZELLE          ACCOUNT NO.:  [id number]   MEDICAL RECORD NO.:  [id number]          PATIENT TYPE:  INP   LOCATION:  2013                         FACILITY:  [facility]   PHYSICIAN:  Duke Salvia, MD, FACCDATE OF BIRTH:  [birth year]   DATE OF PROCEDURE:  DATE OF DISCHARGE:  06/17/2008                               OPERATIVE REPORT   PREOPERATIVE DIAGNOSIS:  Atrial fibrillation.   POSTOPERATIVE DIAGNOSIS:  Sinus rhythm.   PROCEDURE:  DC cardioversion.   The patient was submitted to Anesthesia under the care of Dr. Sheldon Silvan, receiving 200 mg of Pentothal.  A 120 Joule shock was delivered  synchronously in a biphasic waveform terminating the atrial fibrillation  restoring sinus rhythm.  The patient tolerated the procedure without  apparent complications.      Duke Salvia, MD, [facility]  Electronically Signed     SCK/MEDQ  D:  06/17/2008  T:  06/18/2008  Job:  [id number]

## 2011-02-07 IMAGING — MG MM SCREEN MAMMOGRAM BILATERAL
4 series · 4 of 4 positions shown · non-contrast
Comparison: Prior studies.

DG SCREEN MAMMOGRAM BILATERAL
Bilateral CC and MLO view(s) were taken.
Prior study comparison: May 12, 2008, DG screen mammogram bilateral.

DIGITAL SCREENING MAMMOGRAM WITH CAD:

[R CC]
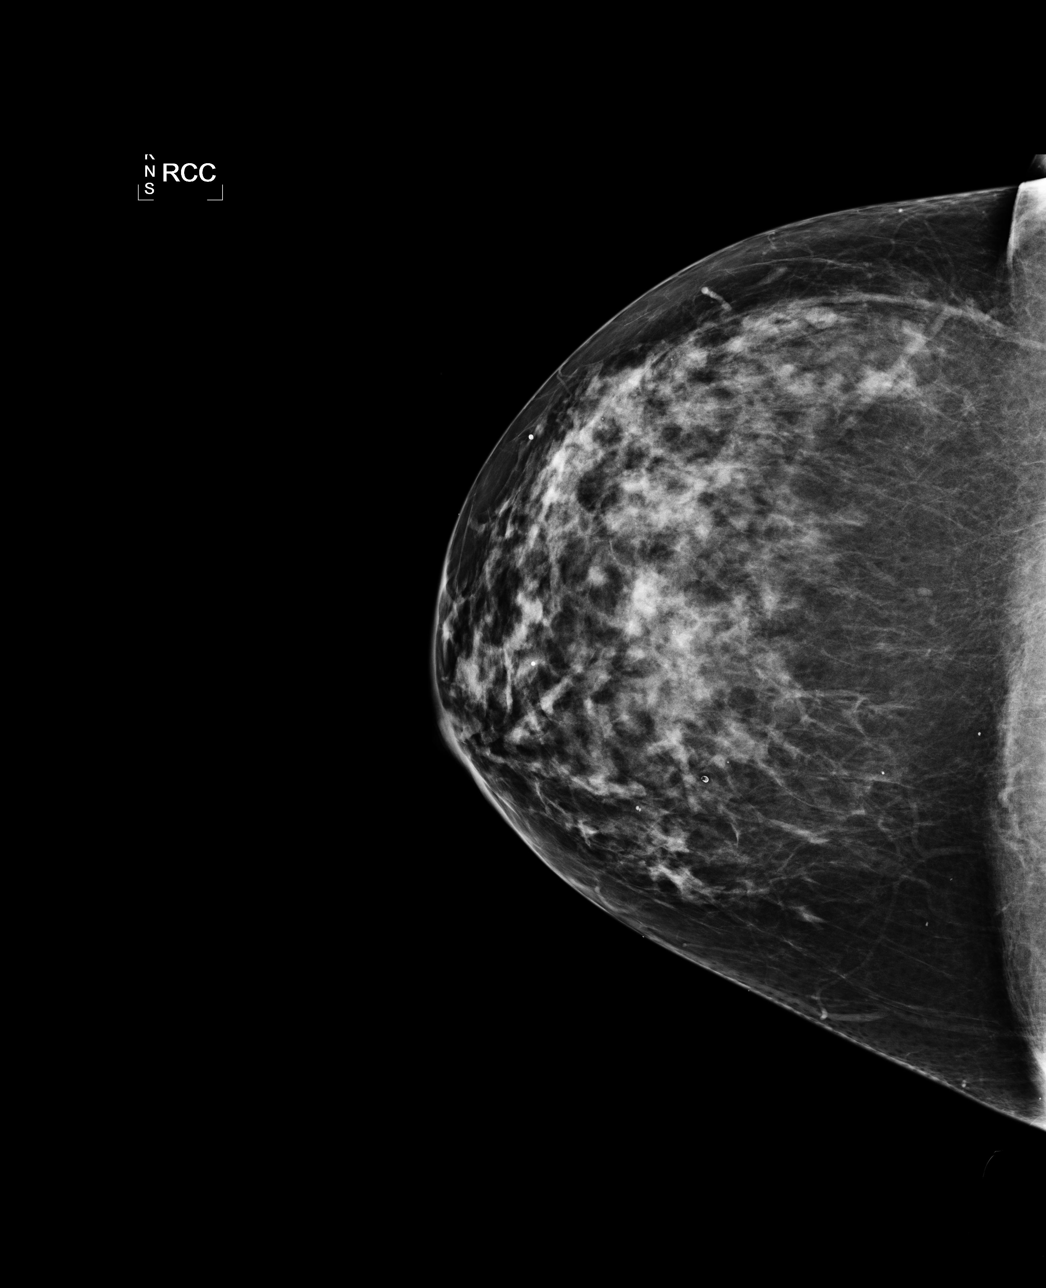

[L CC]
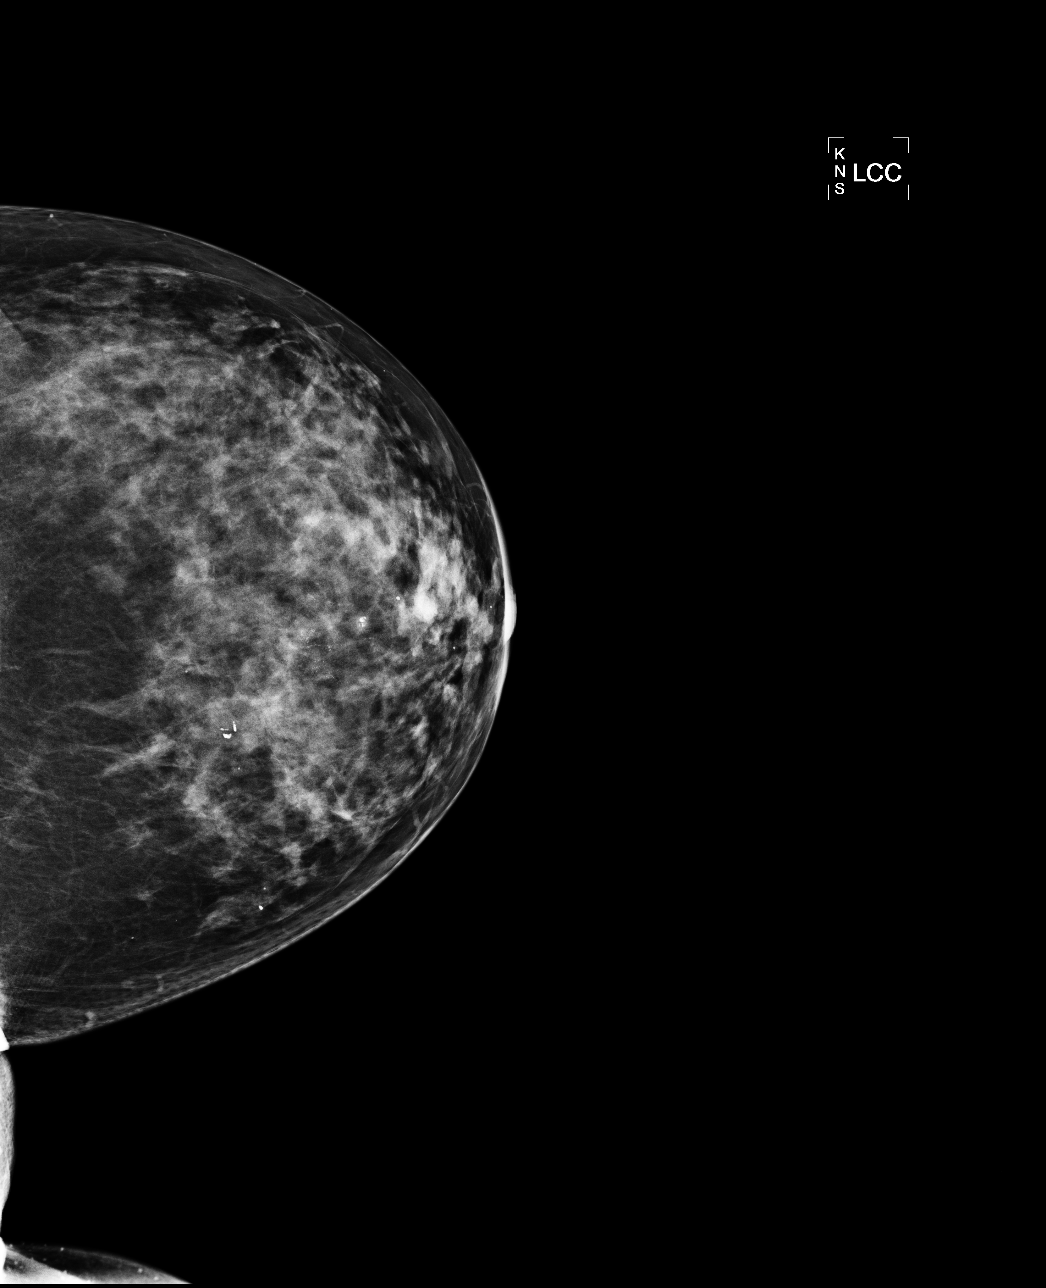

[L MLO]
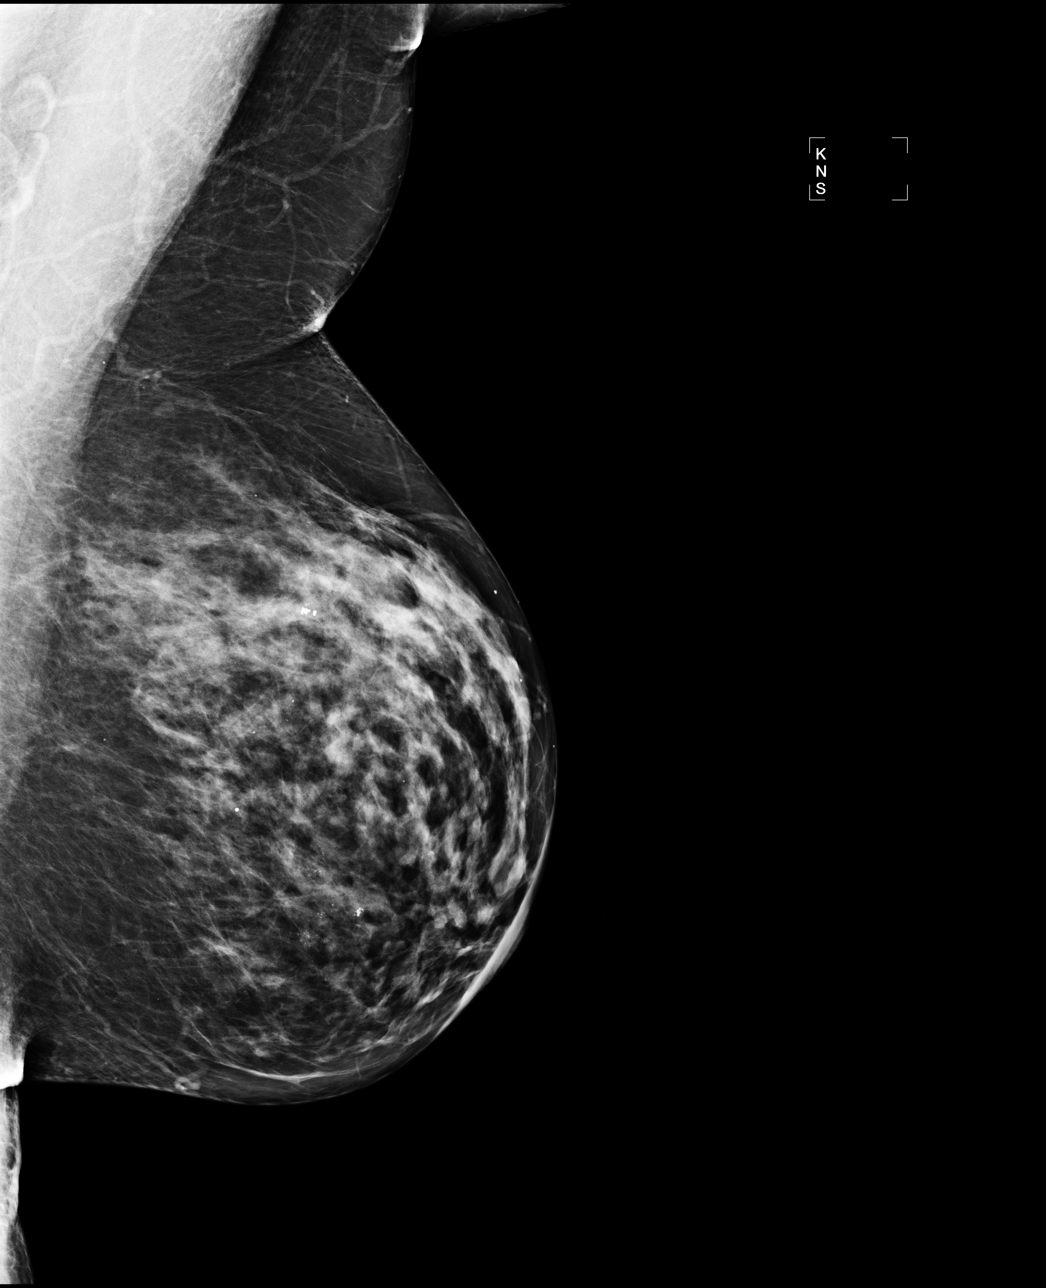

[R MLO]
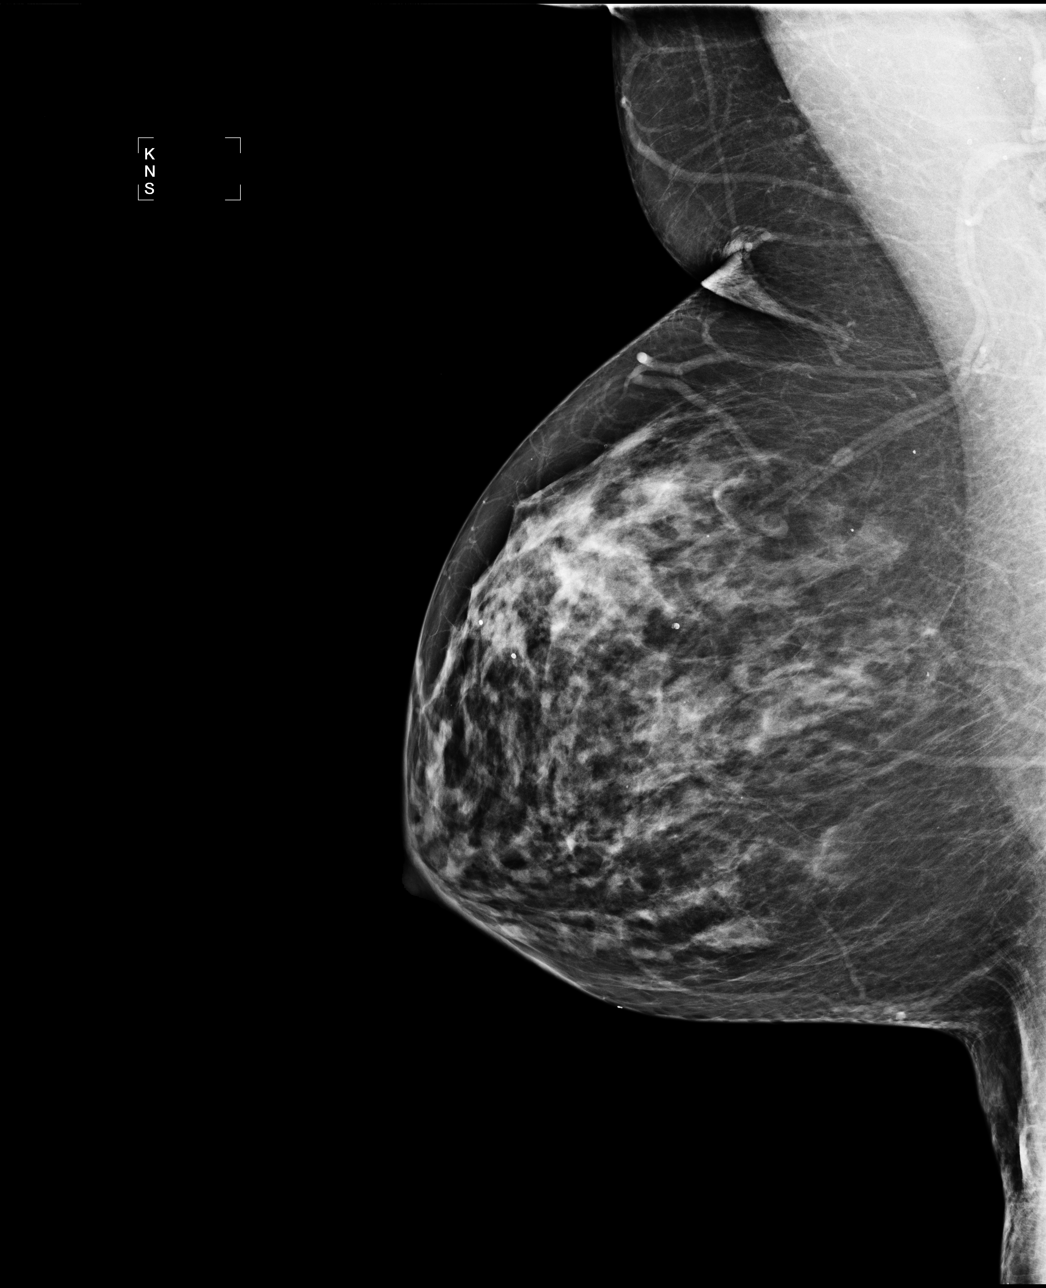

[4 of 4 positions shown; findings below may reference images not displayed]

There are scattered fibroglandular densities.  There are calcifications in the left breast.  
Characterization with magnification views is recommended.  The right breast is unremarkable.

Images were processed with CAD.
IMPRESSION: Calcifications, left breast.  Additional evaluation is indicated.  The patient will be contacted 
for additional studies and a supplemental report will follow.

ASSESSMENT: Need additional imaging evaluation and/or prior mammograms for comparison - BI-RADS 0 -
Left

Further imaging of the left breast.
,

## 2011-02-08 NOTE — Discharge Summary (Signed)
NAME:  Kayla Harrison, Kayla Harrison                    ACCOUNT NO.:  [id number]   MEDICAL RECORD NO.:  [id number]                   PATIENT TYPE:  INP   LOCATION:  3027                                 FACILITY:  [facility]   PHYSICIAN:  T. Stroke, M.D.                     DATE OF BIRTH:  [birth year]   DATE OF ADMISSION:  08/08/2003  DATE OF DISCHARGE:  08/10/2003                                 DISCHARGE SUMMARY   ADDENDUM:   STUDIES AND OTHER LABORATORY DATA:  1. CT on admission shows no acute stroke.  There is an old left head of the     caudate infarct.  2. MRI shows no acute stroke but shows old right pontine, bilateral lacune     in the basal ganglia and thalamus, and chronic five-vessel disease.  3. MRA of the head is normal.  4. MRA of the neck is normal.  5. Carotid Dopplers normal.  6. The 2-D echocardiogram has ejection fraction of 55 to 65% with no embolic     source.  7. Transcranial Doppler has been completed with results pending at time of     discharge.  8. EKG shows normal sinus rhythm.  9. Cholesterol 188, triglycerides 211, HDL 38, LDL 108.  Homocysteine 10.38,     glucose 128.  Hemoglobin A1C 6.5.  10.      RPR nonreactive, RBC folate 648.  11.      Hemoglobin 12.3, hematocrit 36.3, white blood cells 6.7, platelets     203.  12.      Sodium 147, potassium 4.2, chloride 104, CO2 27, BUN 15, creatinine     0.8, glucose 128.  Total protein 6.6, albumin 3.5, AST 21, ALT 24,     alkaline phosphatase 62, total bilirubin 0.4.  Calcium 9.1.  INR on     admission 2.4 with PT at 21.4.  TSH 3.595.  Sed rate 20.      Annie Main, N.PKarie Schwalbe Stroke, M.D.    SB/MEDQ  D:  08/10/2003  T:  08/11/2003  Job:  [id number]
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
## 2011-02-08 NOTE — Assessment & Plan Note (Signed)
[facility]                                 ON-CALL NOTE   NAME:Kayla Harrison, CIVIL                 MRN:          [id number]  DATE:09/10/2008                            DOB:          December 16, 1945    She is a patient of Dr. Sherryl Manges.   [name] called this afternoon stating that she had a  cardioversion yesterday and was subsequently, placed on amiodarone.  However, today she went back into atrial fibrillation with rapid  ventricular response in the 120s.  Her preference would be to stay home  and continue to load her amiodarone at home.  She states she is  currently doing 300 mg twice a day.  She was instructed by Dr. Graciela Husbands.  I  advised that would be okay.  She is on Coumadin chronically.  Further,  she would like to have this issue dealt with, would like to be in sinus  rhythm by Christmas.  I recommended that if she does not convert with  amiodarone at home by loading 300 mg twice a day, by tomorrow morning  then she should consider coming into the hospital at which point we can  load her with IV amiodarone and if she still does not convert, we can  potentially re-cardiovert her on Monday with amiodarone on board.  Her  plan for right now is to stay home and come in only if she does not  convert by tomorrow afternoon.     Nicolasa Ducking, ANP  Electronically Signed    CB/MedQ  DD: 09/10/2008  DT: 09/10/2008  Job #: [id number]
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
## 2011-02-13 ENCOUNTER — Encounter (INDEPENDENT_AMBULATORY_CARE_PROVIDER_SITE_OTHER): Payer: Self-pay | Admitting: Surgery

## 2011-02-15 ENCOUNTER — Ambulatory Visit (INDEPENDENT_AMBULATORY_CARE_PROVIDER_SITE_OTHER): Payer: Medicare Other | Admitting: *Deleted

## 2011-02-15 DIAGNOSIS — I4891 Unspecified atrial fibrillation: Secondary | ICD-10-CM

## 2011-02-15 DIAGNOSIS — I6350 Cerebral infarction due to unspecified occlusion or stenosis of unspecified cerebral artery: Secondary | ICD-10-CM

## 2011-02-21 IMAGING — MG MM BREAST STEREO BIOPSY*L*
2 series · 2 of 2 positions shown · non-contrast
Comparison: none

Addendum Begins

The final pathological diagnosis is high grade ductal carcinoma in
situ.  This is concordant with the imaging findings.  The results
were discussed with the patient by telephone on 07/24/2009.  Her
questions were answered.  She reported marked bruising, pain and
swelling of the left breast, compatible with expected post-biopsy
changes in a patient taking Coumadin and aspirin.  She temporarily
discontinued the aspirin following the biopsy.  She was given an
appointment to see Dr. Kitade at [DATE] a.m. on 07/31/2009.  She was
given a breast MR appointment at [HOSPITAL] at [HOSPITAL] at [DATE] a.m. on 07/28/2009.
Addendum Ends
CLINICAL DATA: Left breast calcifications suspicious for
malignancy at recent mammography.

[L CC]
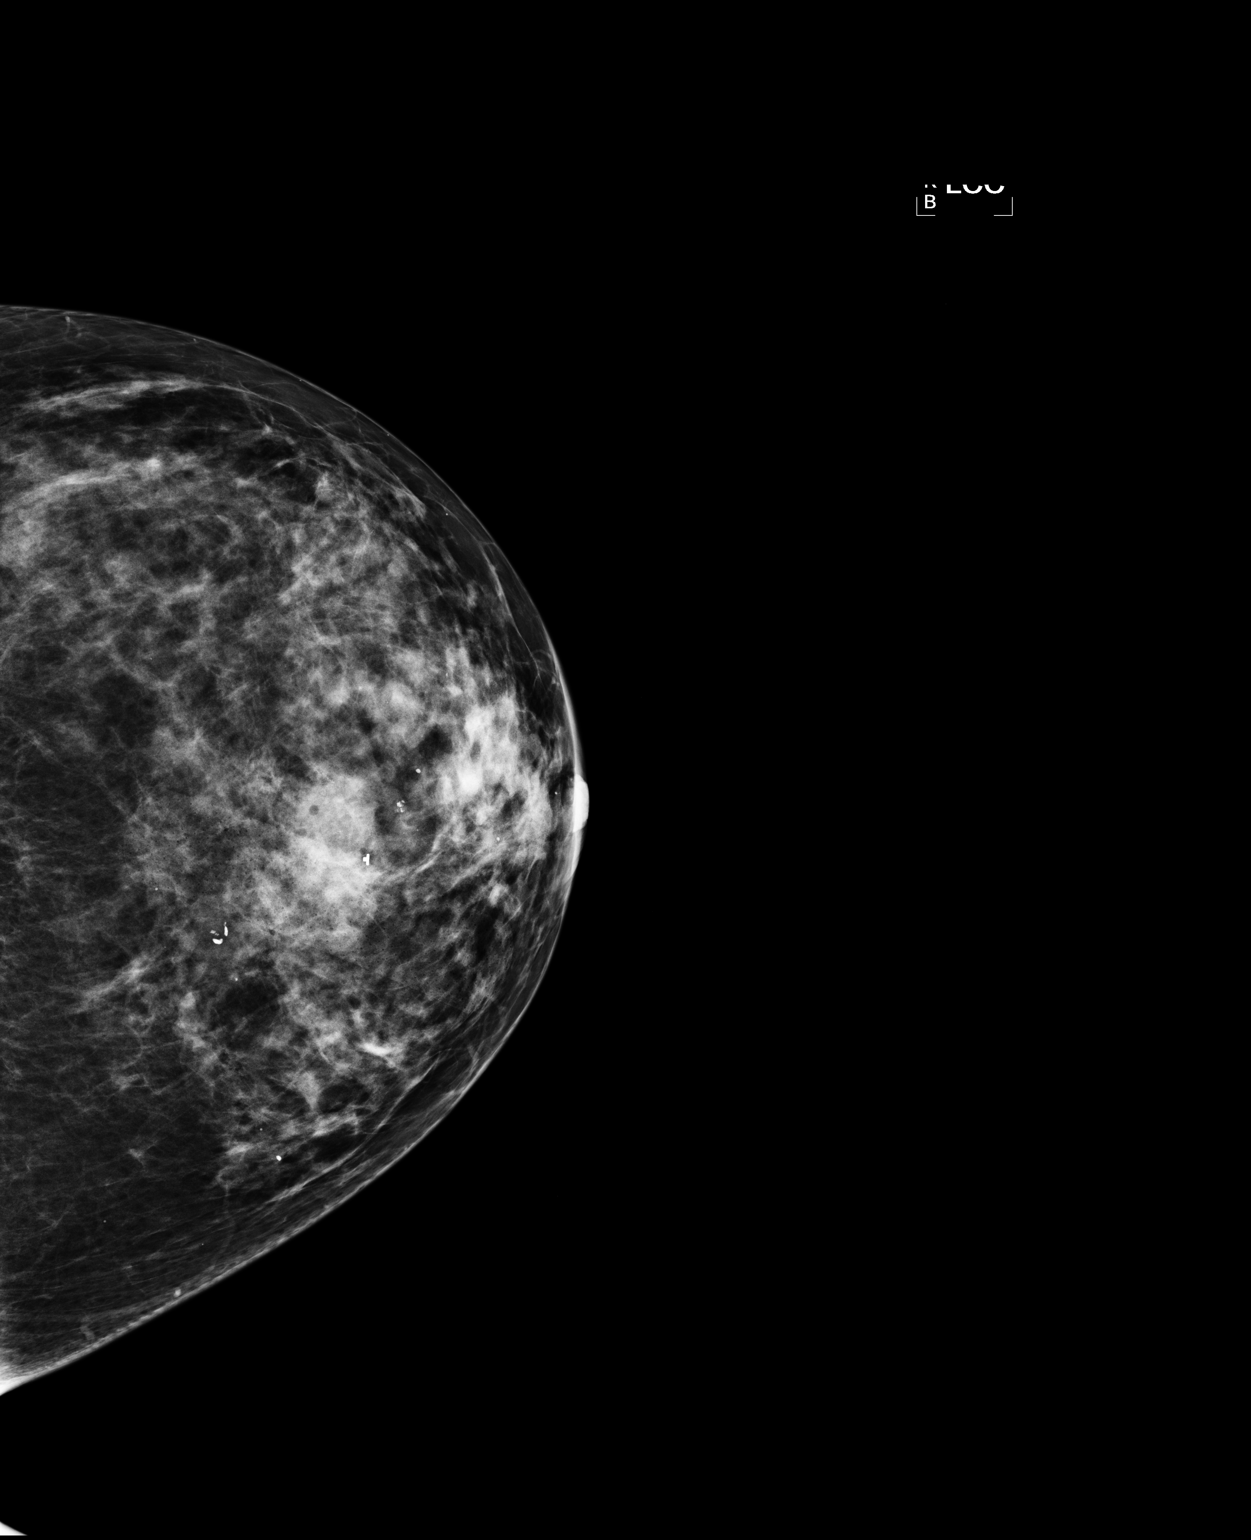

[L ML]
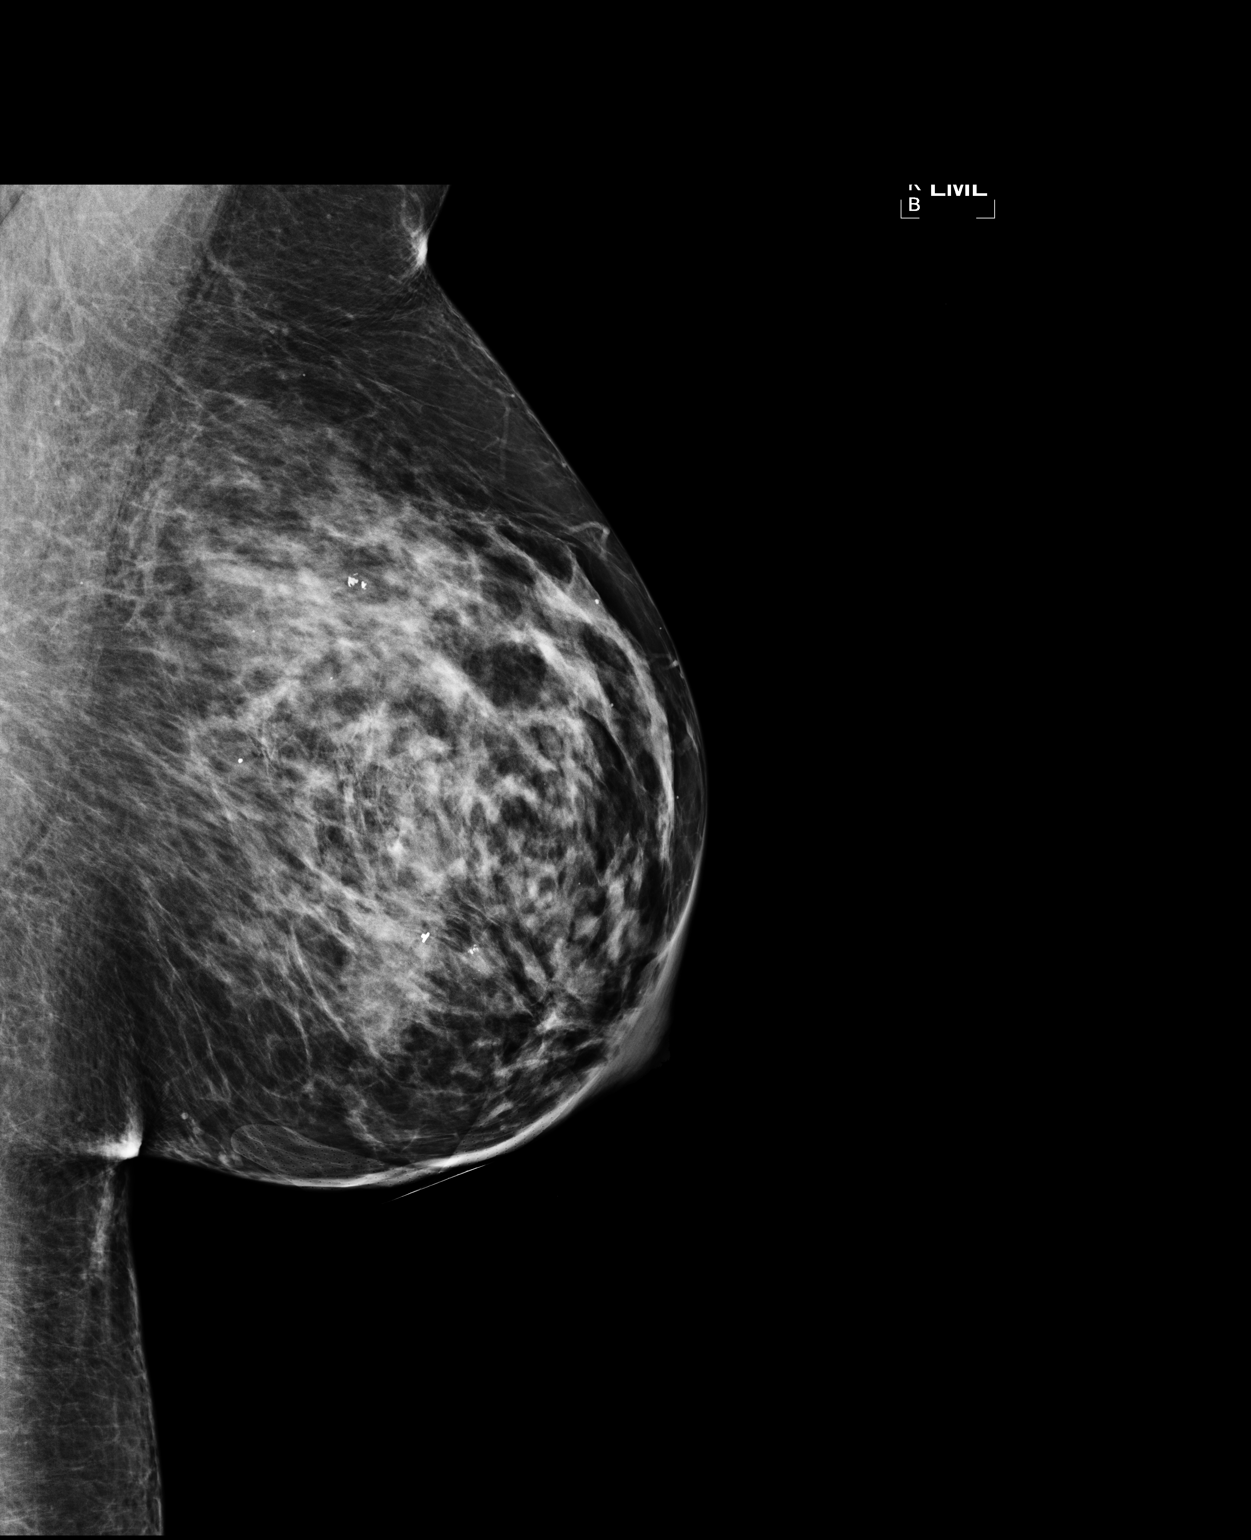

[2 of 2 positions shown; findings below may reference images not displayed]

STEREOTACTIC-GUIDED VACUUM ASSISTED BIOPSY OF THE LEFT BREAST AND
SPECIMEN RADIOGRAPH

I met with the patient, and we discussed the procedure of
stereotactic-guided biopsy, including risks, benefits, and
alternatives.  Specifically, we discussed the risks of infection,
bleeding, tissue injury, clip migration, and inadequate sampling.
Informed, written consent was given.

Using sterile technique, 2% lidocaine, stereotactic guidance, and a
9 gauge vacuum assisted device, biopsy was performed of the 2.5 cm
cluster of microcalcifications seen in the 6 o'clock subareolar
region of the left breast earlier today.  An inferior approach was
used.  Specimen radiograph was performed, showing multiple
calcifications within the specimens.  Specimens with calcifications
are identified for pathology.

At the conclusion of the procedure, a tissue marker clip was
deployed into the biopsy cavity.  Follow-up 2-view mammogram
confirmed clip deployment at the location of the biopsied
microcalcifications.  The majority of the microcalcifications are
no longer seen.
IMPRESSION: Stereotactic-guided biopsy of left breast calcifications.  No
apparent complications.

## 2011-02-21 IMAGING — MG MM DIAGNOSTIC LTD LEFT
2 series · 2 of 2 positions shown · non-contrast
Comparison: Previous examinations, including the screening
mammogram dated 07/07/2009.

CLINICAL DATA: Left breast calcifications at recent screening
mammography.

DIGITAL DIAGNOSTIC LEFT MAMMOGRAM

[L CC]
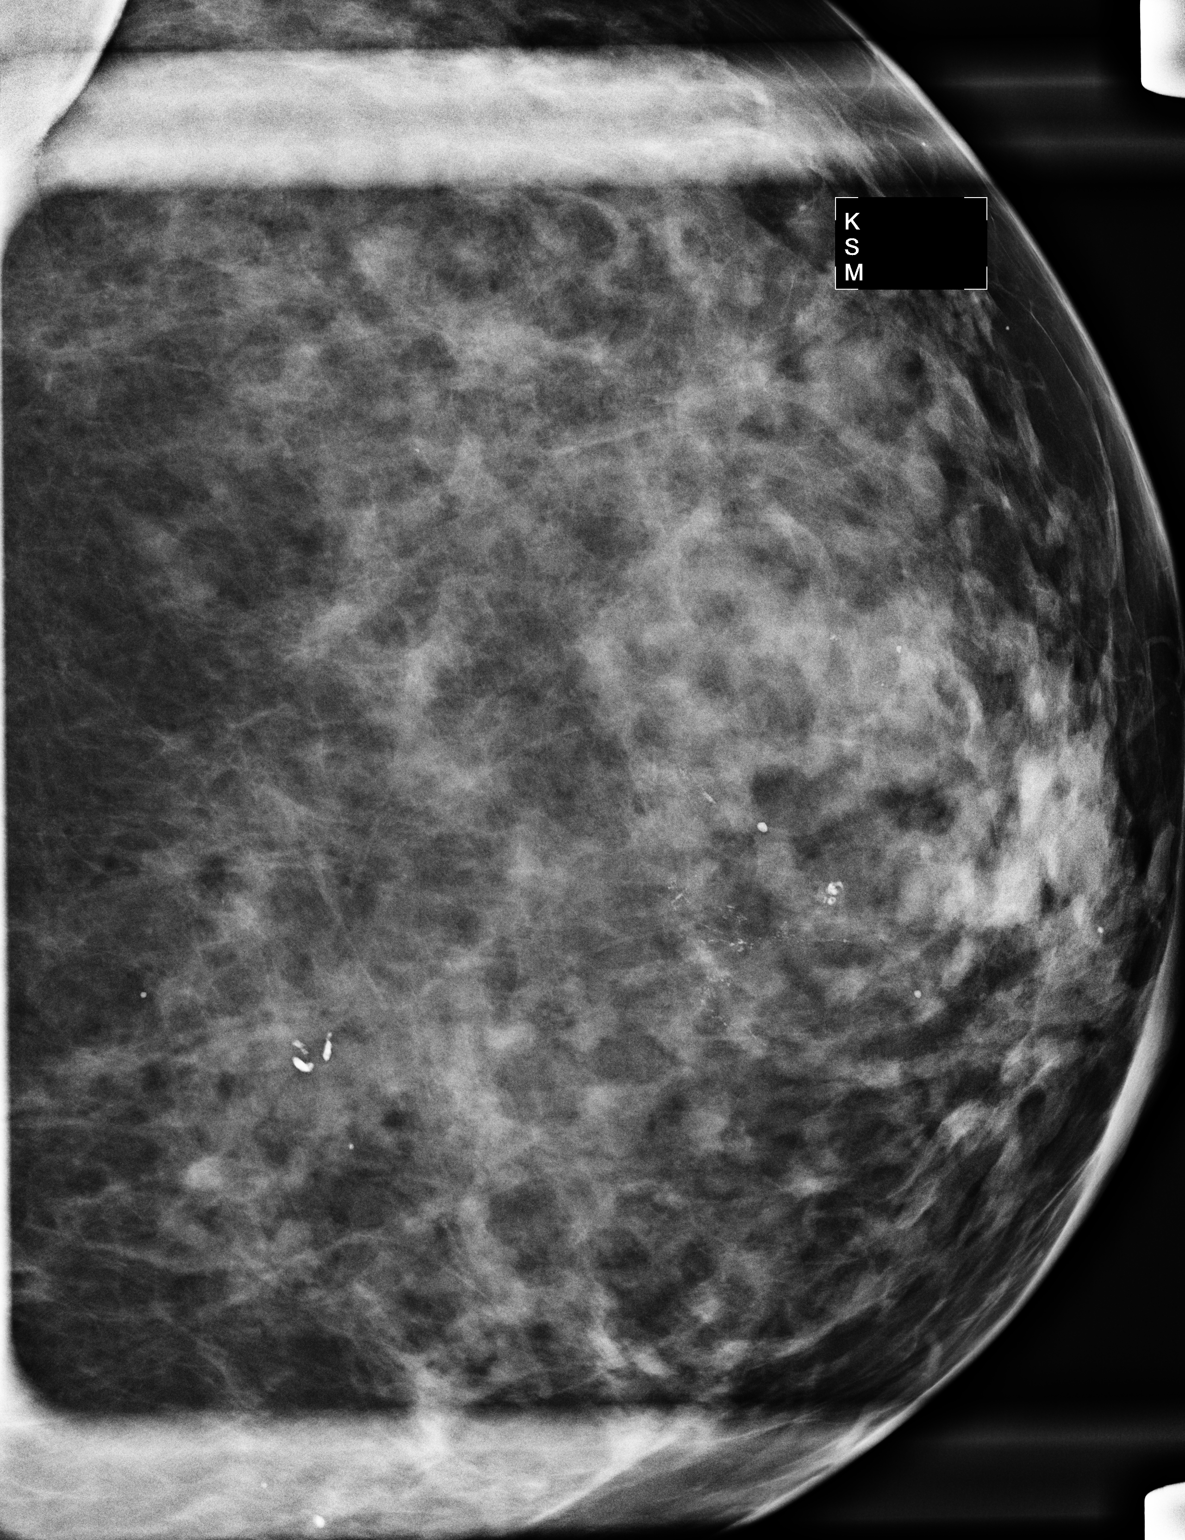

[L ML]
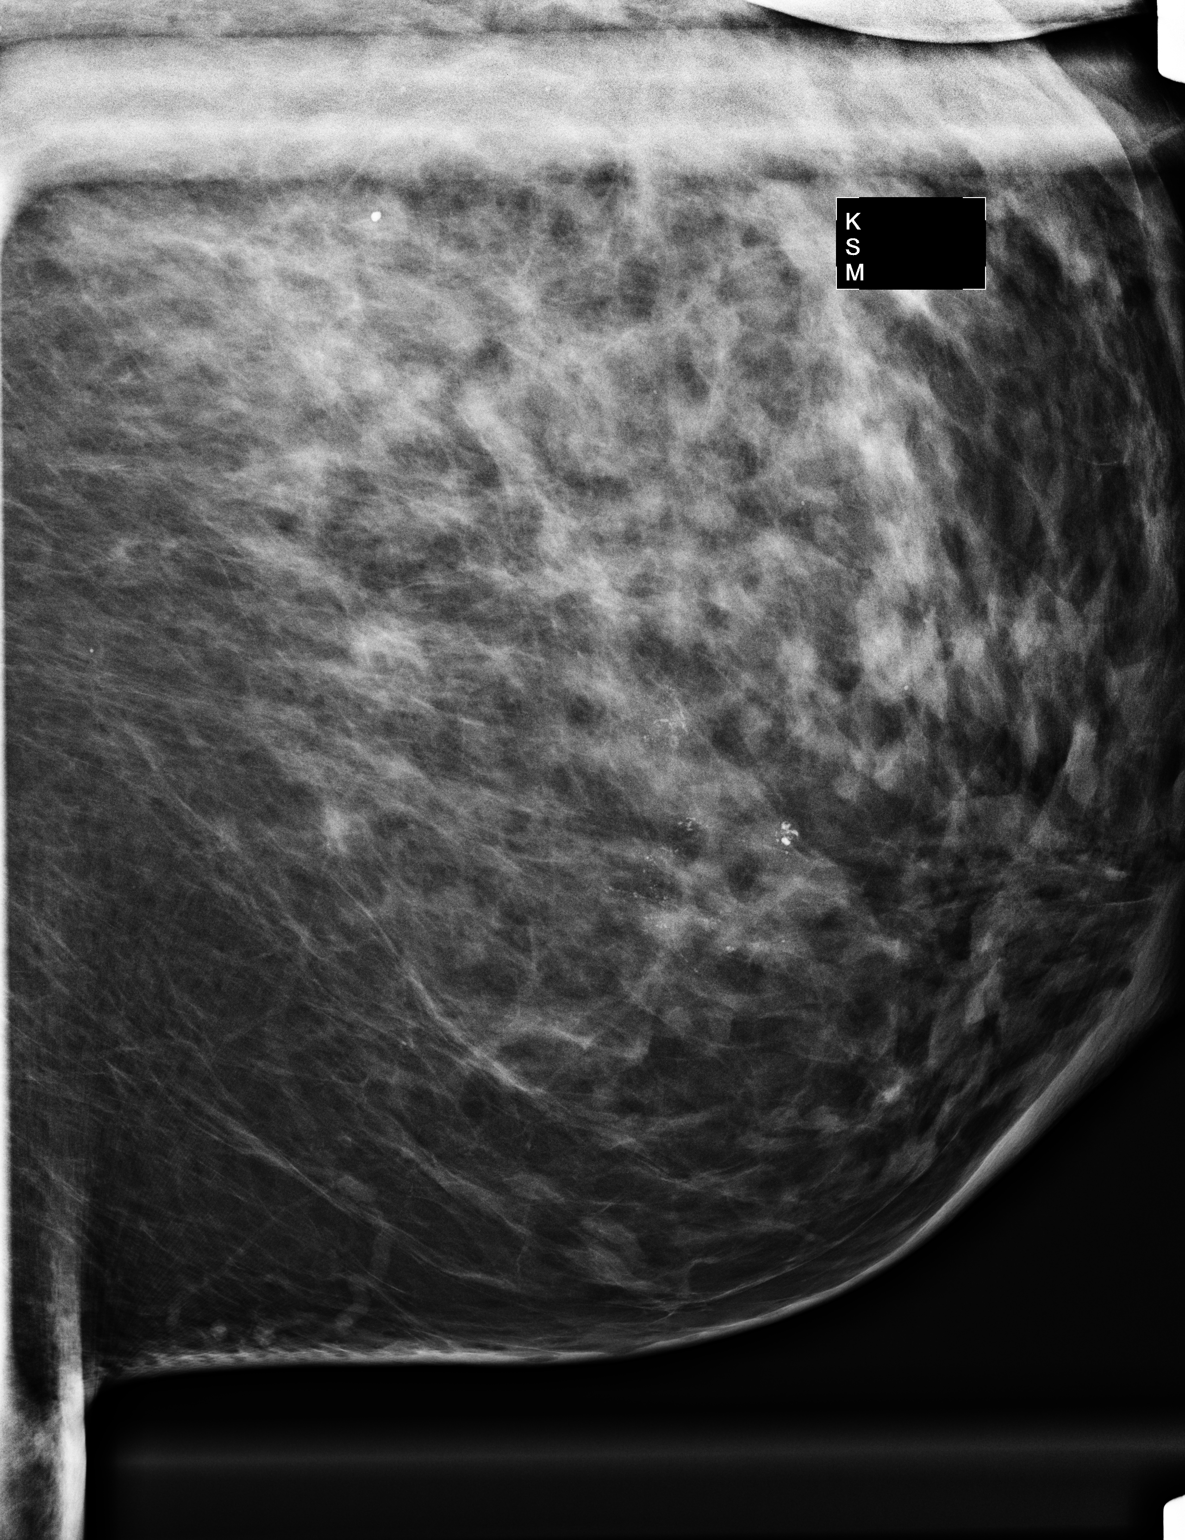

[2 of 2 positions shown; findings below may reference images not displayed]

FINDINGS: A 2.5 cm cluster of pleomorphic microcalcifications is
demonstrated in the 6 o'clock subareolar region of the left breast.
These include linearly arranged calcifications as well as linear
shaped calcifications.  No associated mass or architectural
distortion seen.
IMPRESSION: 2.5 cm cluster of pleomorphic microcalcifications in the 6 o'clock
subareolar region of the left breast.  These are highly suspicious
for malignancy.  The options of stereotactic guided core needle
biopsy and surgical excisional biopsy were discussed with the
patient.  Stereotactic guided core needle biopsy is scheduled to
follow.

BI-RADS CATEGORY 5:  Highly suggestive of malignancy - appropriate
action should be taken.

## 2011-02-28 IMAGING — MR MR BREAST BILATERAL W WO CONTRAST
4 of 13 series · 15 of 48 positions shown · IV contrast (multihance)
Comparison: July 07, 2009 and July 21, 2009

CLINICAL DATA: Newly diagnosed left DCIS at 6 o'clock, subareolar

BILATERAL BREAST MRI WITH AND WITHOUT CONTRAST
TECHNIQUE: Multiplanar, multisequence MR images of both breasts
were obtained prior to and following the intravenous administration
of 10ml of multihance.  Three dimensional images were evaluated at
the independent DynaCad workstation.

[Series 2: T2 · axial · 3.0mm · 0.85mm/px · z∈[-63,+99]mm · 3 of 55 slices shown]
[im 1/55]
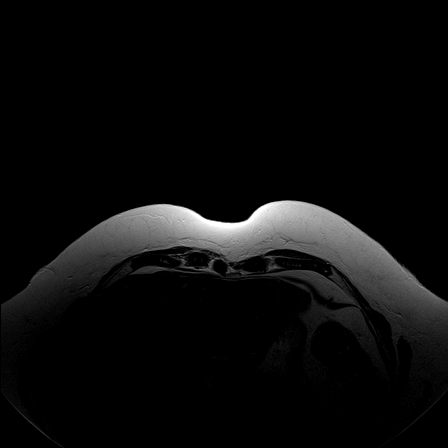
[im 28/55]
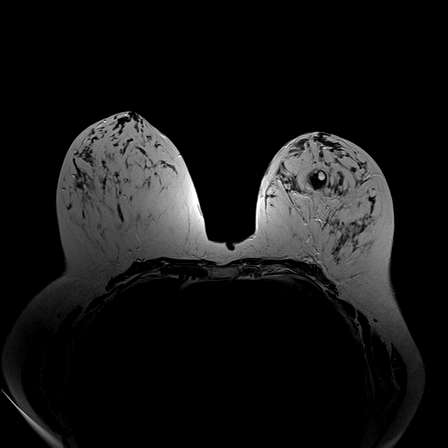
[im 55/55]
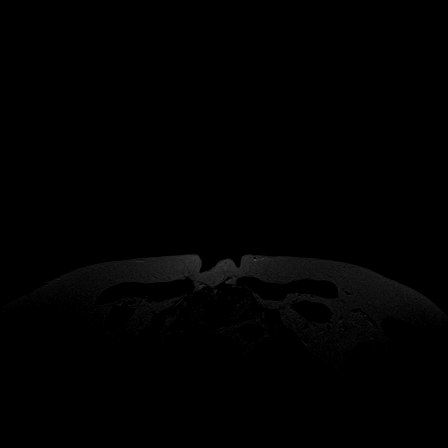

[Series 3: t2_tirm_tra ipat (a-p) · axial · 3.0mm · 0.74mm/px · z∈[-63,+99]mm · 2 of 55 slices shown]
[im 1/55]
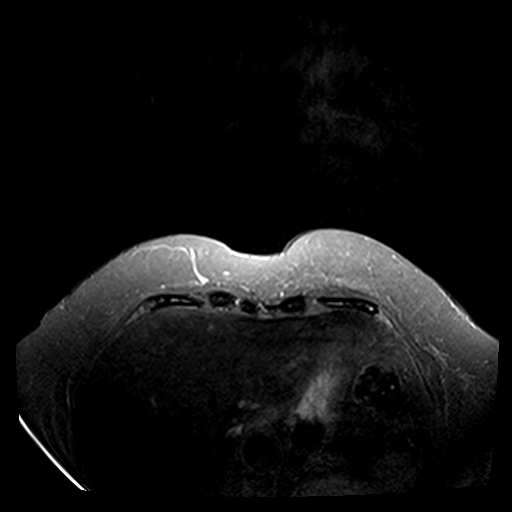
[im 55/55]
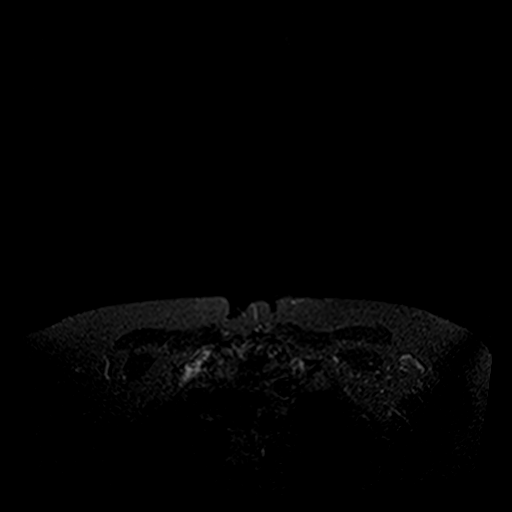

[Series 4: fl3d pre-cm no · axial · non-contrast · 1.2mm · 0.59mm/px · z∈[-68,+104]mm · 5 of 144 slices shown]
[im 1/144]
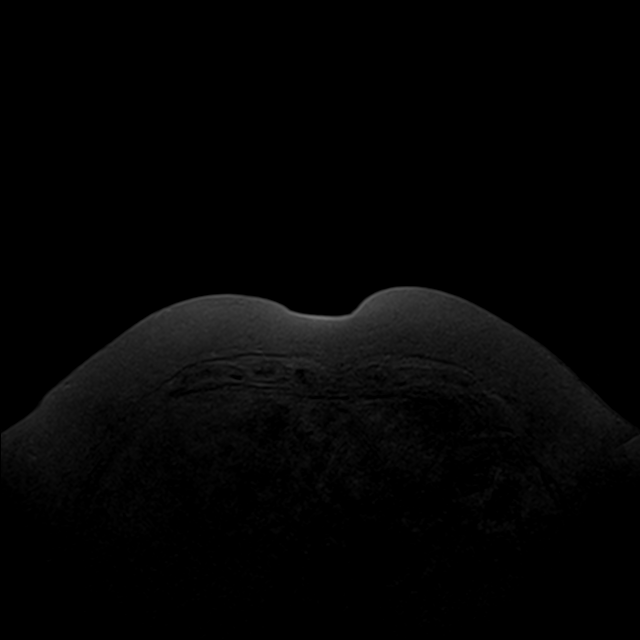
[im 36/144]
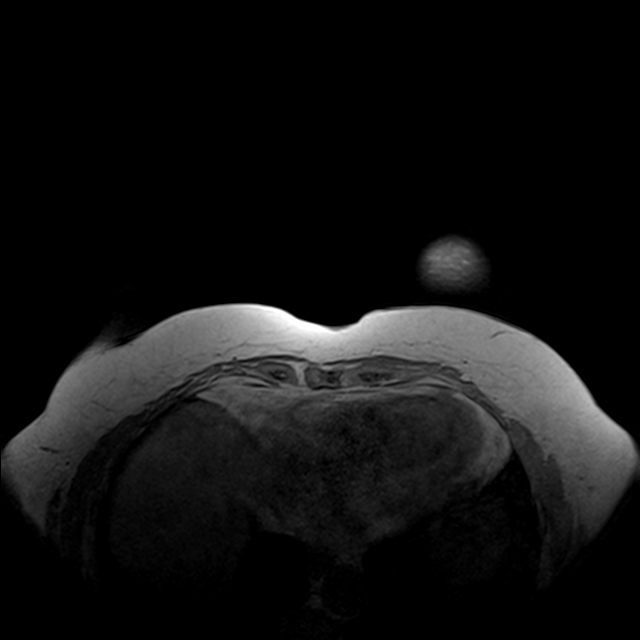
[im 72/144]
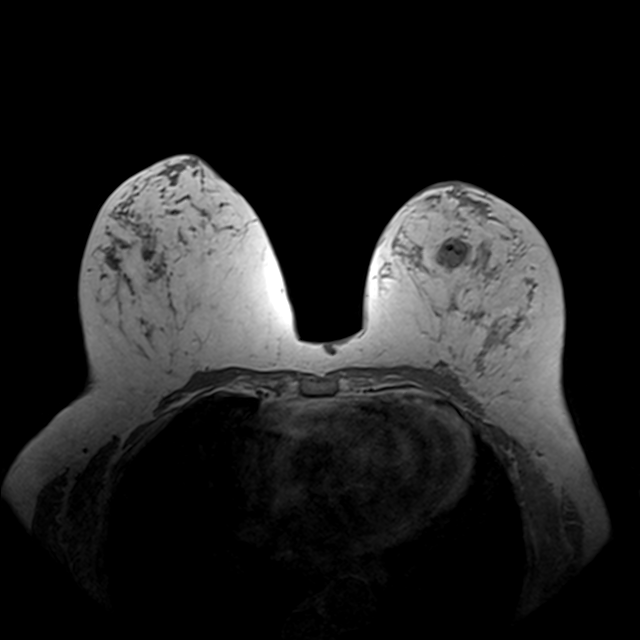
[im 108/144]
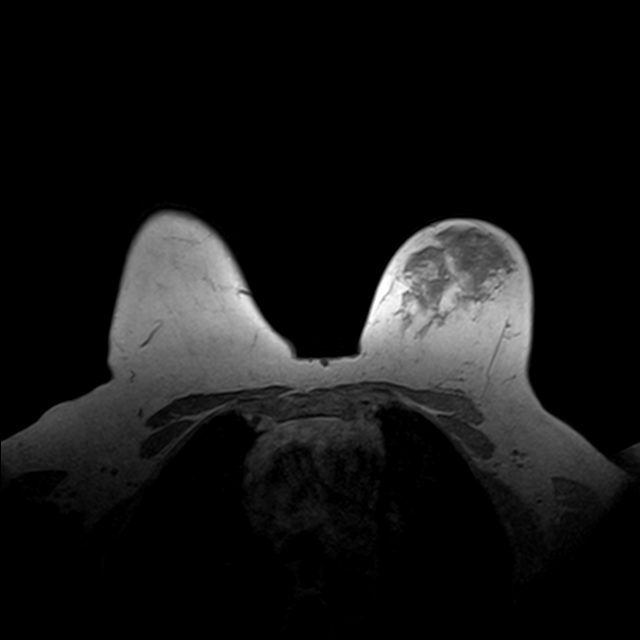
[im 144/144]
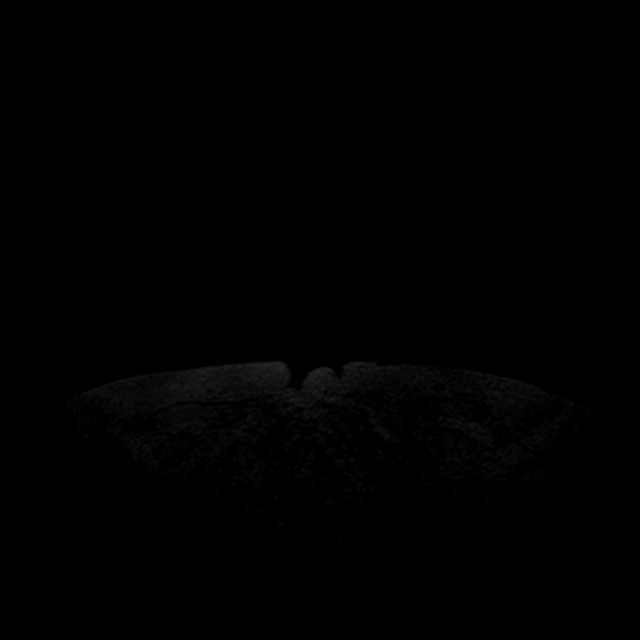

[Series 5: fl3d pre-cm · axial · non-contrast · 1.2mm · 0.59mm/px · z∈[-68,+104]mm · 5 of 144 slices shown]
[im 1/144]
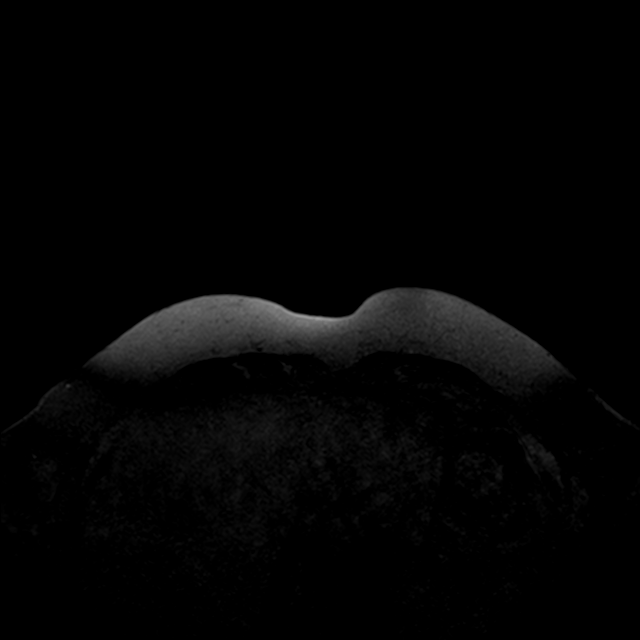
[im 36/144]
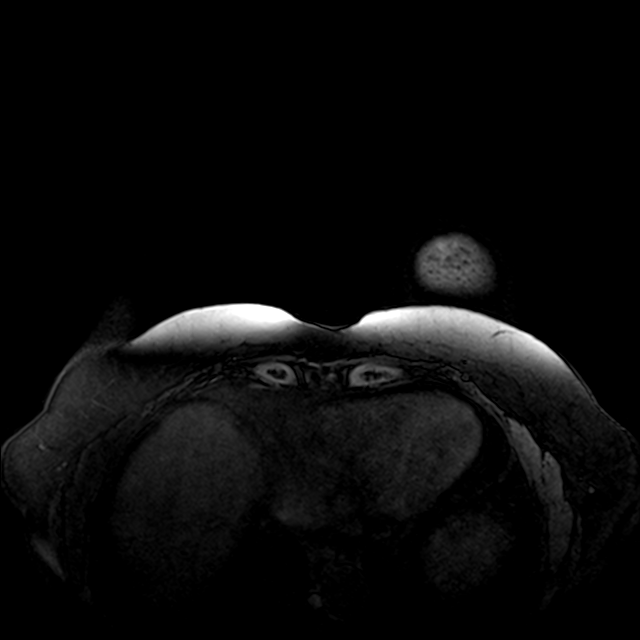
[im 72/144]
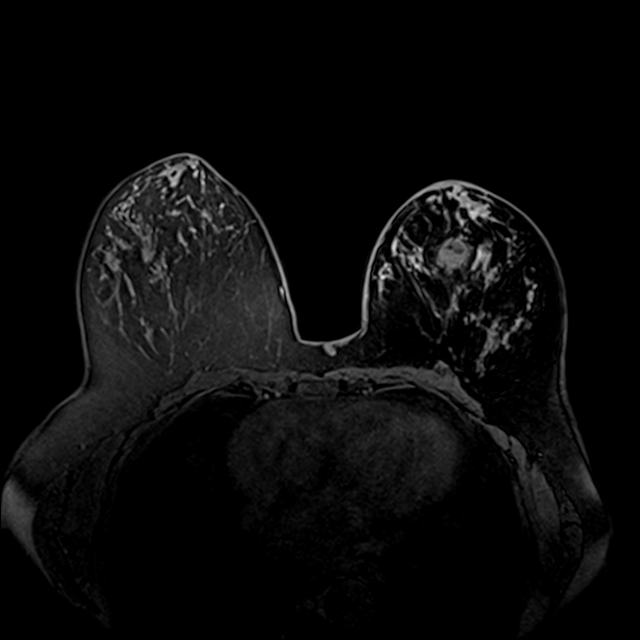
[im 108/144]
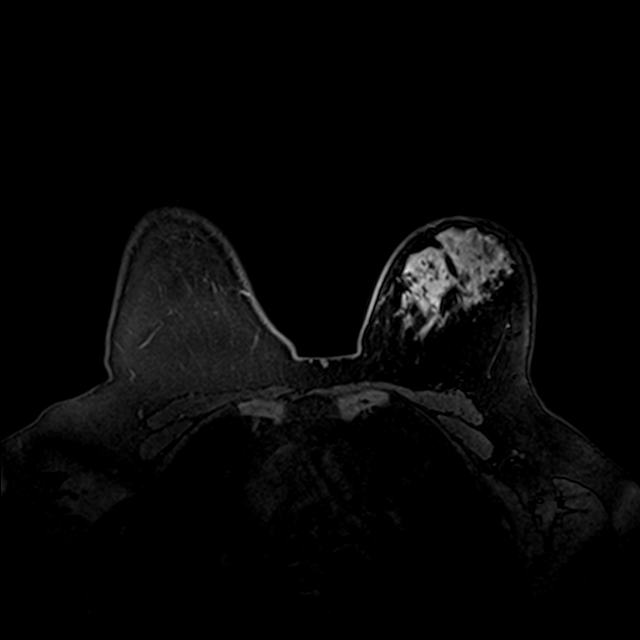
[im 144/144]
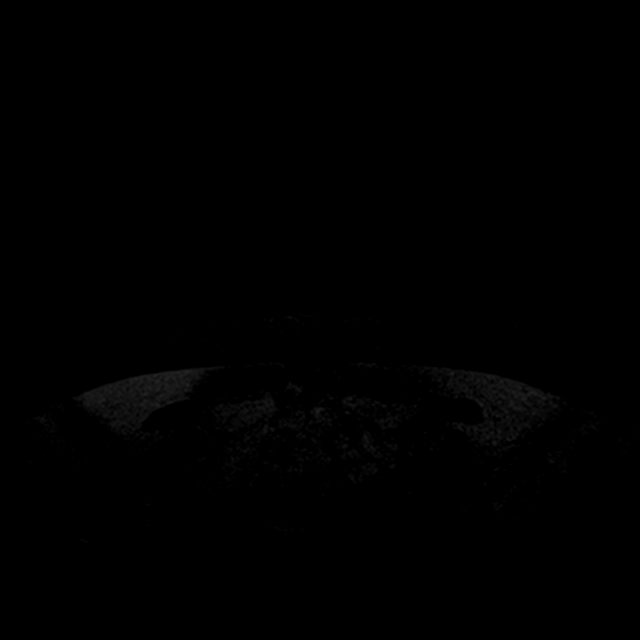

[15 of 48 positions shown; findings below may reference images not displayed]

FINDINGS: No suspicious masses or enhancement are identified in
either breast.  Biopsy changes are noted in the subareolar and
inferior left breast.  There is no axillary or internal mammary
adenopathy bilaterally.

Incidental note is made of numerous small splenic and hepatic cysts
IMPRESSION: Negative bilateral breast MRI.  There is no correlating enhancement
in the region of known DCIS on the left.

THREE-DIMENSIONAL MR IMAGE RENDERING ON INDEPENDENT WORKSTATION:

Three-dimensional MR images were rendered by post-processing of the
original MR data on an independent workstation.  The three-
dimensional MR images were interpreted, and findings were reported
in the accompanying complete MRI report for this study.

BI-RADS CATEGORY 6:  Known biopsy-proven malignancy - appropriate
action should be taken.

## 2011-03-04 ENCOUNTER — Encounter: Payer: Medicare Other | Attending: Surgery | Admitting: *Deleted

## 2011-03-04 DIAGNOSIS — Z9884 Bariatric surgery status: Secondary | ICD-10-CM | POA: Insufficient documentation

## 2011-03-04 DIAGNOSIS — Z713 Dietary counseling and surveillance: Secondary | ICD-10-CM | POA: Insufficient documentation

## 2011-03-04 DIAGNOSIS — Z09 Encounter for follow-up examination after completed treatment for conditions other than malignant neoplasm: Secondary | ICD-10-CM | POA: Insufficient documentation

## 2011-03-19 ENCOUNTER — Ambulatory Visit (INDEPENDENT_AMBULATORY_CARE_PROVIDER_SITE_OTHER): Payer: Medicare Other | Admitting: *Deleted

## 2011-03-19 DIAGNOSIS — I6350 Cerebral infarction due to unspecified occlusion or stenosis of unspecified cerebral artery: Secondary | ICD-10-CM

## 2011-03-19 DIAGNOSIS — I4891 Unspecified atrial fibrillation: Secondary | ICD-10-CM

## 2011-03-19 LAB — POCT INR: INR: 2

## 2011-04-01 ENCOUNTER — Encounter: Payer: Self-pay | Admitting: Internal Medicine

## 2011-04-02 IMAGING — MG MM BREAST WIRE LOCALIZATION*L*
4 series · 4 of 4 positions shown · non-contrast
Comparison: none

CLINICAL DATA: The left breast DCIS.  Preoperative needle
localization.

[L CC (1 of 3)]
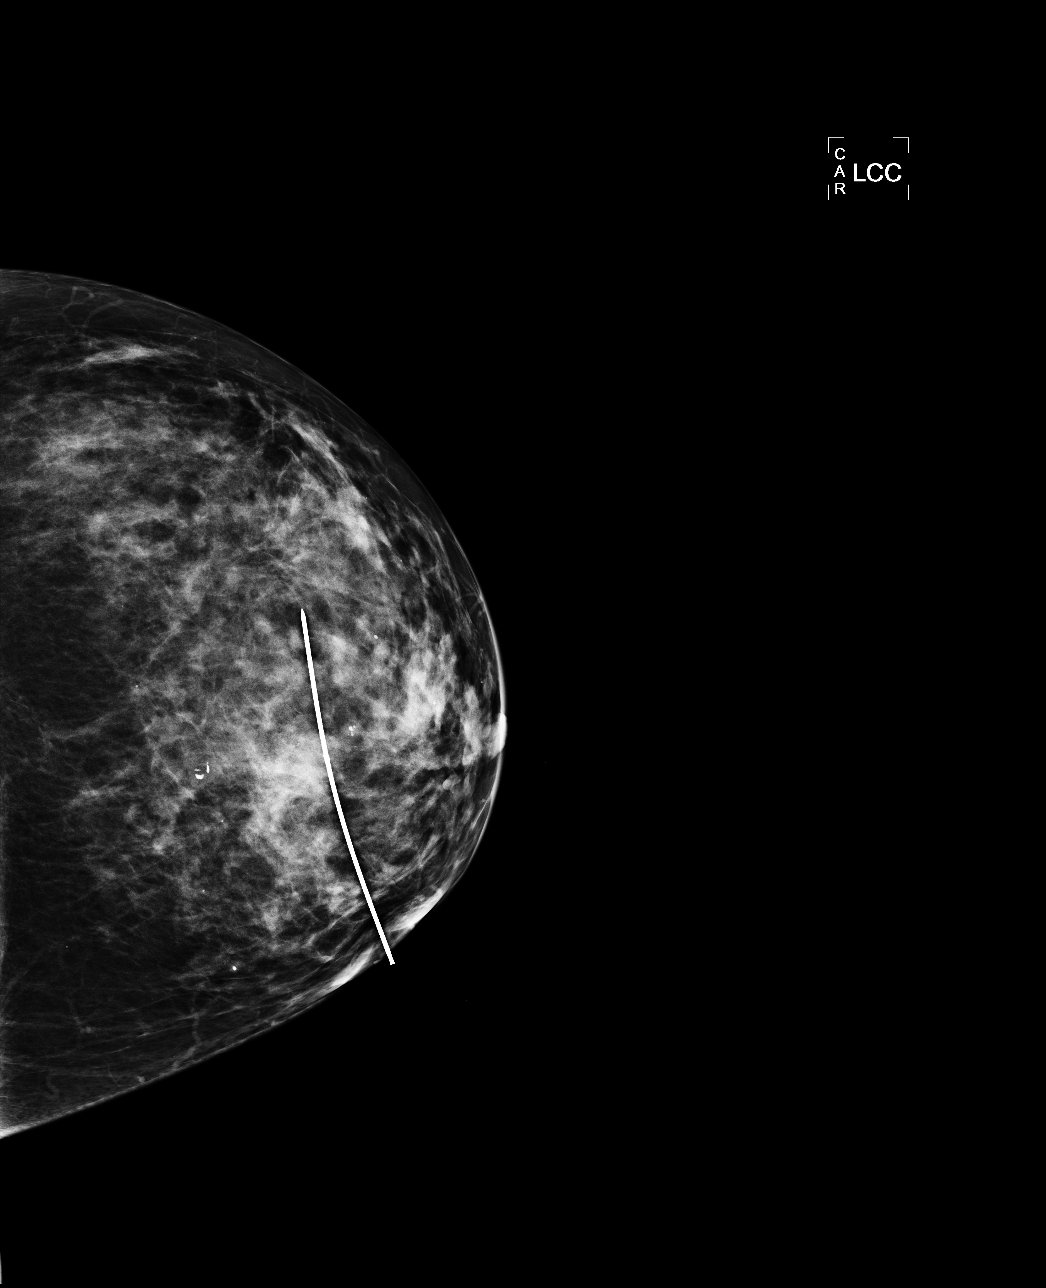

[L CC (2 of 3)]
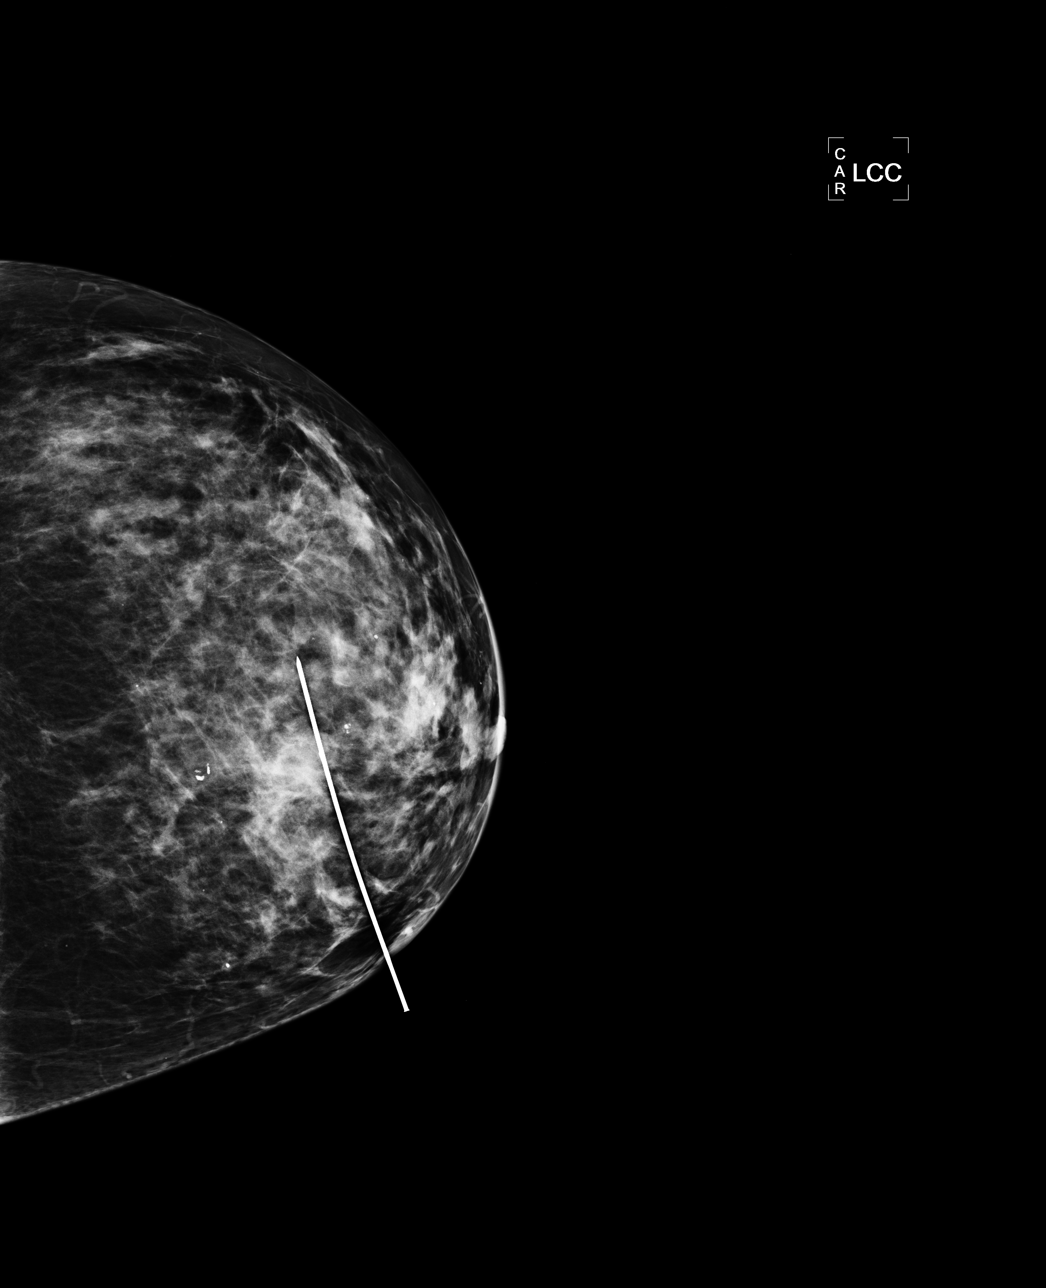

[L ML]
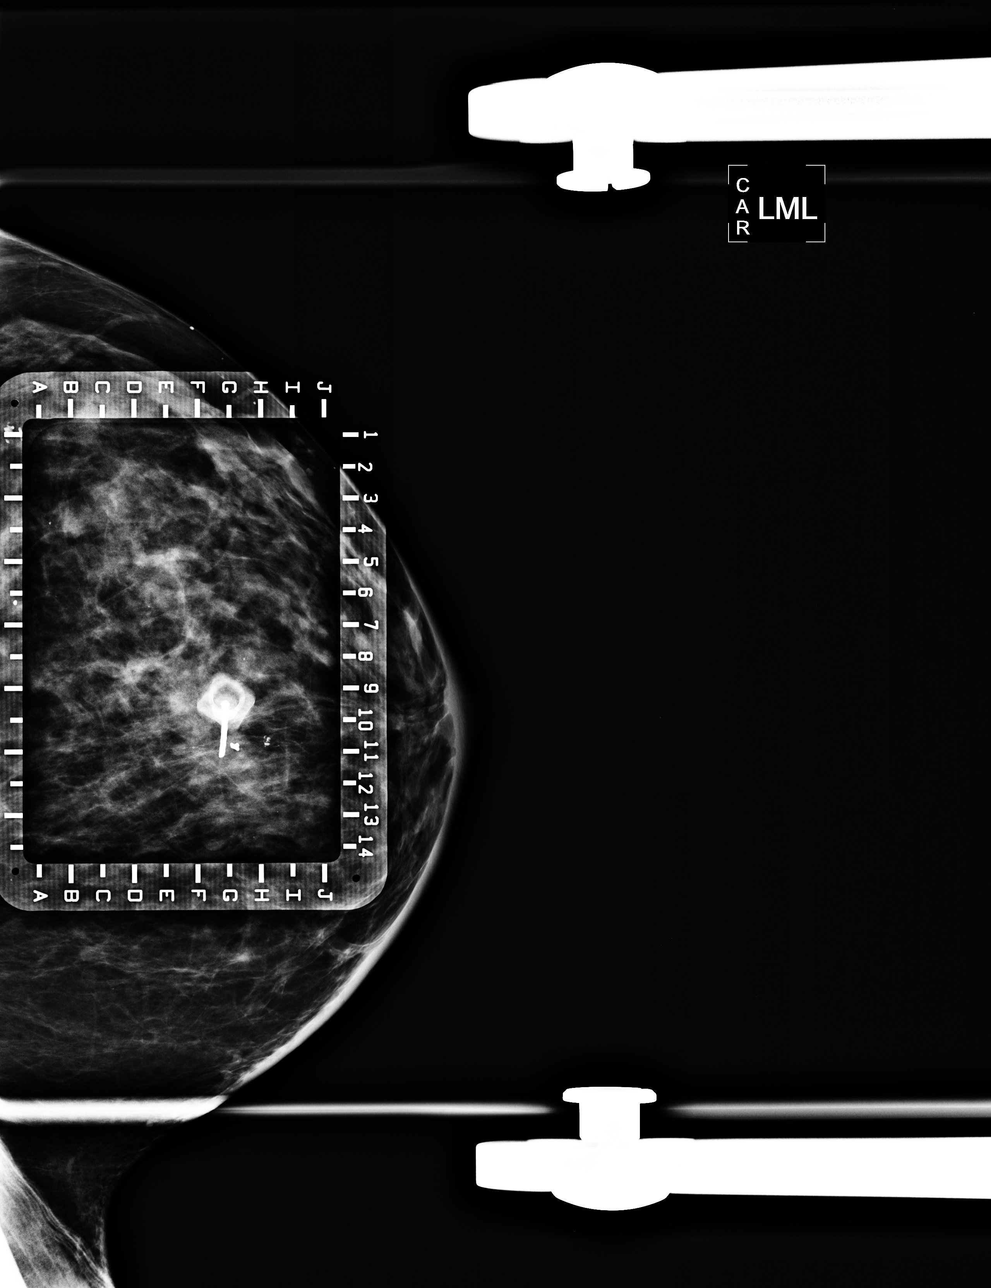

[L CC (3 of 3)]
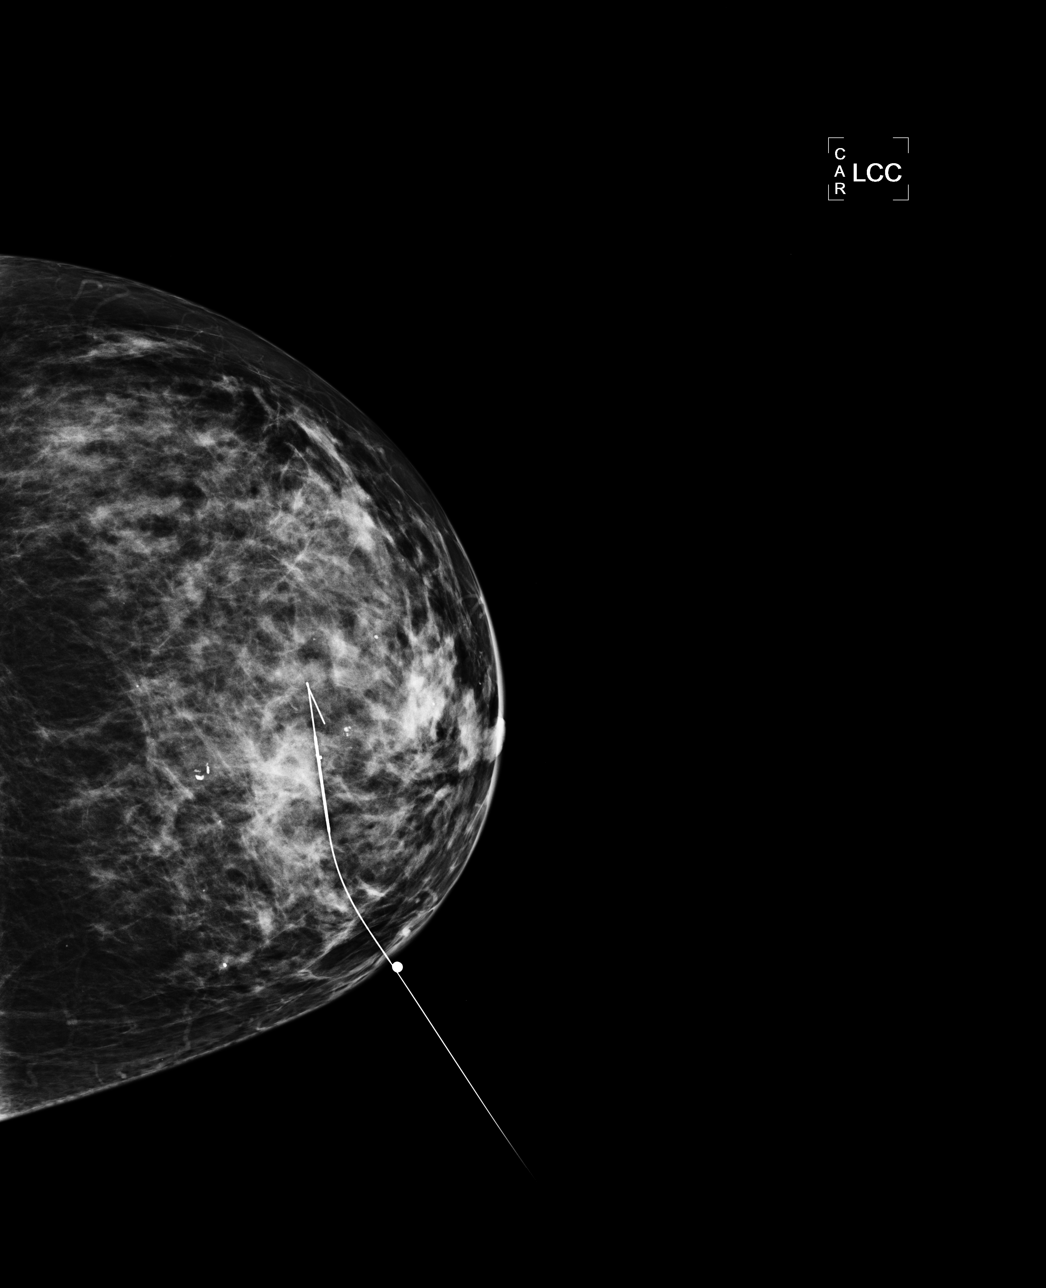

[4 of 4 positions shown; findings below may reference images not displayed]

NEEDLE LOCALIZATION WITH MAMMOGRAPHIC GUIDANCE AND SPECIMEN
RADIOGRAPH

Patient presents for needle localization prior to surgical excision
of left breast DCIS.  I met with the patient and we discussed the
procedure of needle localization including risks, benefits, and
alternatives. Specifically, we discussed the risks of infection,
bleeding, tissue injury, and inadequate sampling. Informed written
consent was given.

Using mammographic guidance, sterile technique, 2% lidocaine and a
7 cm modified Kopans needle, the clip with adjacent calcifications
was localized using a medial approach.  The films are marked for
Dr. Feroz.

Specimen radiograph was performed at day surgery, and confirms the
clip with adjacent calcifications to be present in the tissue
sample.  The specimen is marked for pathology.
IMPRESSION: Needle localization of the left breast.  No apparent complications.

## 2011-04-13 ENCOUNTER — Other Ambulatory Visit: Payer: Self-pay | Admitting: Internal Medicine

## 2011-04-16 ENCOUNTER — Ambulatory Visit (INDEPENDENT_AMBULATORY_CARE_PROVIDER_SITE_OTHER): Payer: Medicare Other | Admitting: *Deleted

## 2011-04-16 DIAGNOSIS — I6350 Cerebral infarction due to unspecified occlusion or stenosis of unspecified cerebral artery: Secondary | ICD-10-CM

## 2011-04-16 DIAGNOSIS — I4891 Unspecified atrial fibrillation: Secondary | ICD-10-CM

## 2011-04-16 LAB — POCT INR: INR: 1.6

## 2011-04-17 ENCOUNTER — Other Ambulatory Visit: Payer: Self-pay | Admitting: Internal Medicine

## 2011-04-17 MED ORDER — PRAVASTATIN SODIUM 20 MG PO TABS: 20.0000 mg | ORAL_TABLET | Freq: Every day | ORAL | Status: DC

## 2011-04-17 NOTE — Telephone Encounter (Signed)
RX sent to pharmacy  

## 2011-04-26 ENCOUNTER — Encounter (INDEPENDENT_AMBULATORY_CARE_PROVIDER_SITE_OTHER): Payer: Self-pay | Admitting: Physician Assistant

## 2011-04-26 ENCOUNTER — Other Ambulatory Visit: Payer: Self-pay | Admitting: Internal Medicine

## 2011-04-26 ENCOUNTER — Ambulatory Visit (INDEPENDENT_AMBULATORY_CARE_PROVIDER_SITE_OTHER): Payer: Medicare Other | Admitting: Physician Assistant

## 2011-04-26 VITALS — BP 136/82 | Ht 66.5 in | Wt 211.6 lb

## 2011-04-26 DIAGNOSIS — Z4651 Encounter for fitting and adjustment of gastric lap band: Secondary | ICD-10-CM

## 2011-04-26 NOTE — Patient Instructions (Signed)
Take clear liquids for the next 48 hours. Thin protein shakes are ok to start on Saturday evening. Call us if you have persistent vomiting or regurgitation, night cough or reflux symptoms. Return as scheduled or sooner if you notice no changes in hunger/portion sizes.   

## 2011-04-26 NOTE — Progress Notes (Signed)
  HISTORY: Kayla Harrison is a 65 y.o.female who received an AP-Standard lap-band in October 2011 by Dr. Daphine Deutscher. She comes today with no new complaints other than increased portion sizes and hunger. She denies persistent vomiting or regurgitation symptoms. She believes her 5 lb weight gain since her last appointment comes from a recent cruise.  VITAL SIGNS: Filed Vitals:   04/26/11 1322  BP: 136/82    PHYSICAL EXAM: Physical exam reveals a very well-appearing 65 y.o.female in no apparent distress Neurologic: Awake, alert, oriented Psych: Bright affect, conversant Respiratory: Breathing even and unlabored. No stridor or wheezing Abdomen: Soft, nontender, nondistended to palpation. Incisions well-healed. No incisional hernias. Port easily palpated. Extremities: Atraumatic, good range of motion.  ASSESMENT: 65 y.o.  female  s/p AP-Standard lap-band. She could use an adjustment today.  PLAN: The patient's port was accessed with a 20G Huber needle without difficulty. Clear fluid was aspirated and 0.5 mL saline was added to the port. The patient was able to swallow water without difficulty following the procedure and was instructed to take clear liquids for the next 24-48 hours and advance slowly as tolerated.

## 2011-05-03 ENCOUNTER — Ambulatory Visit (INDEPENDENT_AMBULATORY_CARE_PROVIDER_SITE_OTHER): Payer: Medicare Other | Admitting: *Deleted

## 2011-05-03 DIAGNOSIS — I6350 Cerebral infarction due to unspecified occlusion or stenosis of unspecified cerebral artery: Secondary | ICD-10-CM

## 2011-05-03 DIAGNOSIS — I4891 Unspecified atrial fibrillation: Secondary | ICD-10-CM

## 2011-05-13 IMAGING — CR DG CHEST 2V
2 series · 2 of 2 positions shown · non-contrast
Comparison: 09/01/2009 study

CLINICAL DATA: History of coughing for 3 weeks.  Acute bronchitis.

CHEST - 2 VIEW

[view not recorded (1 of 2)]
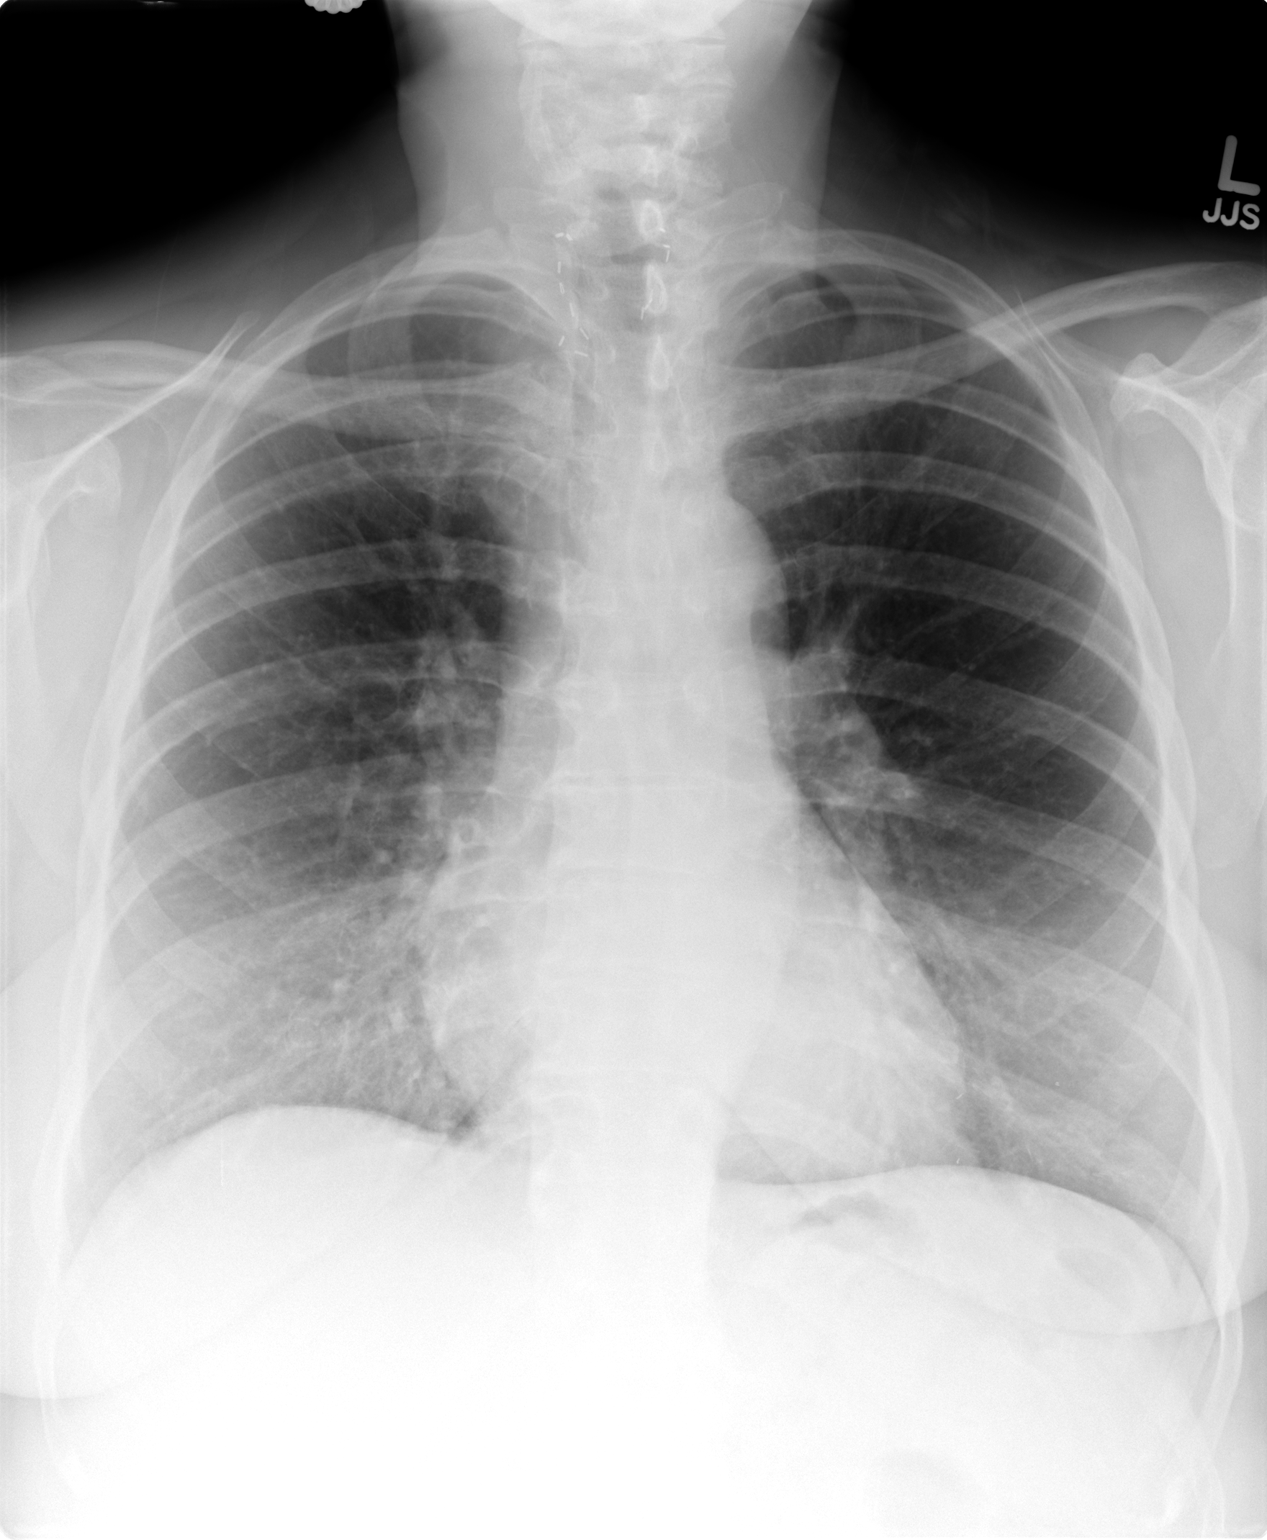

[view not recorded (2 of 2)]
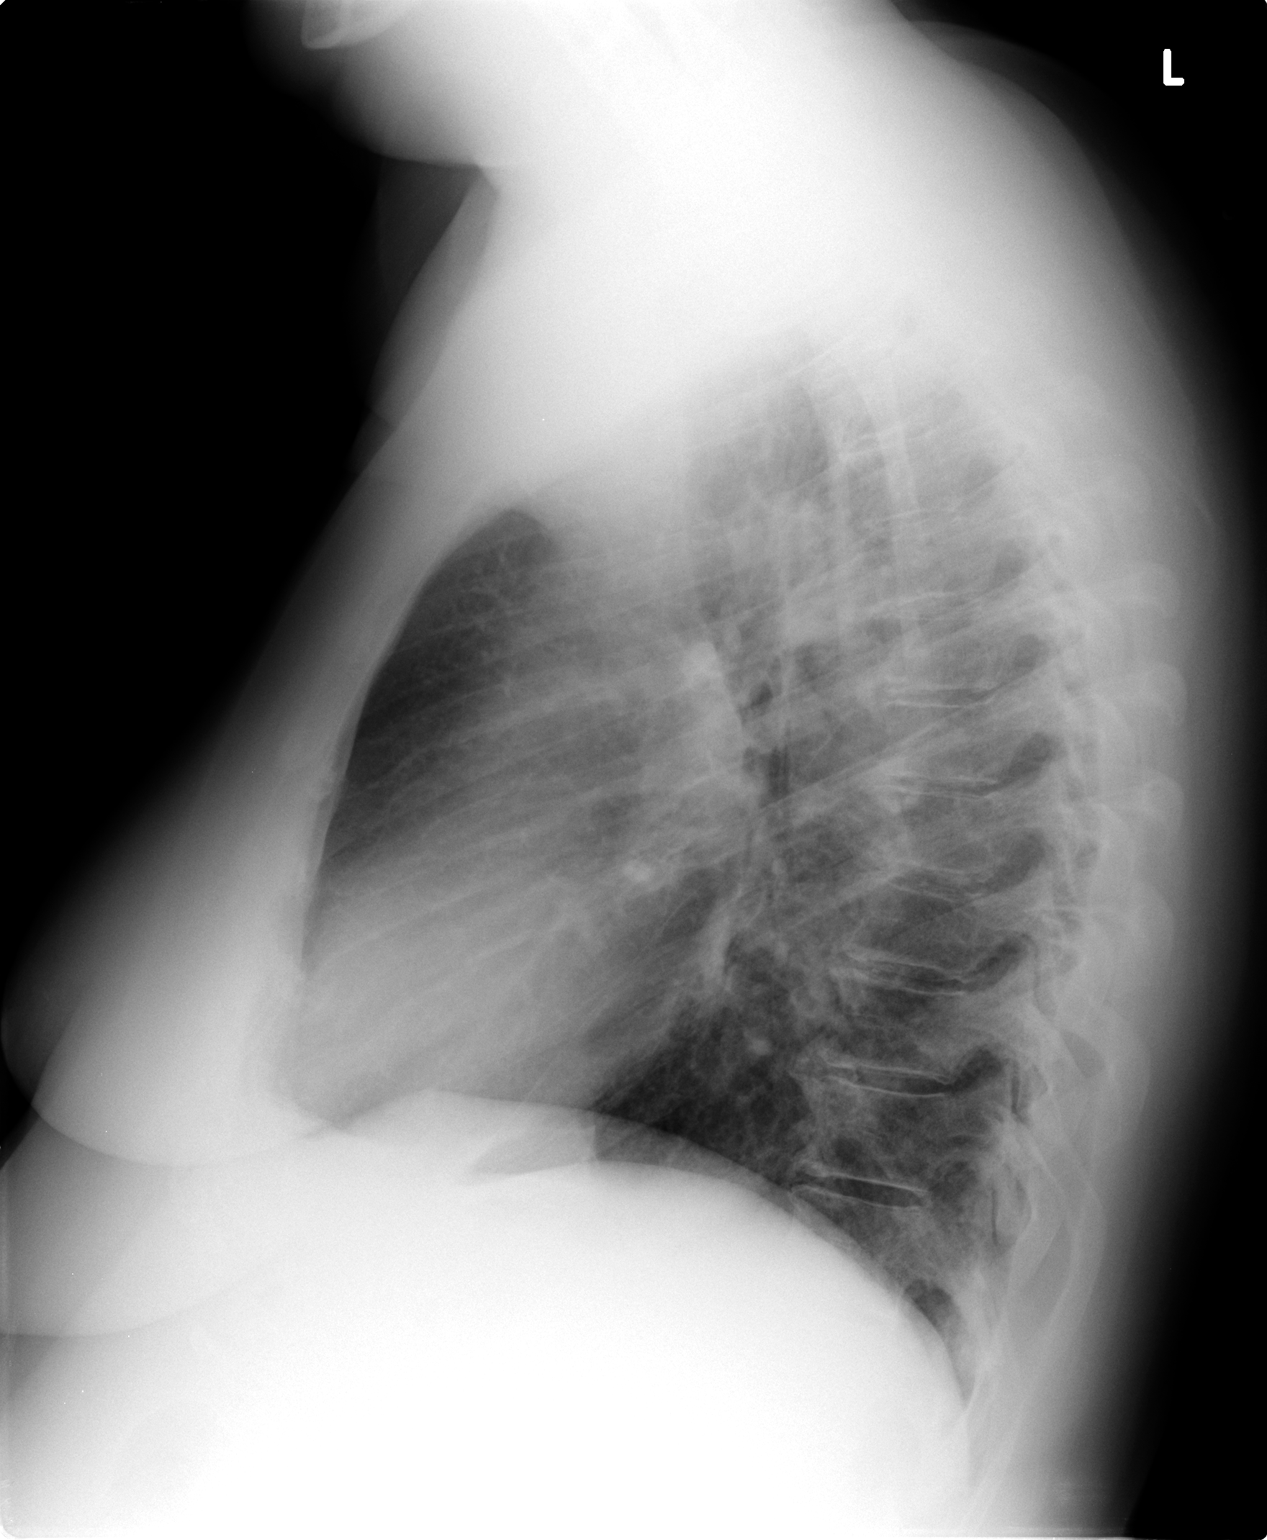

[2 of 2 positions shown; findings below may reference images not displayed]

FINDINGS: The cardiac silhouette is normal size and shape. No
pleural abnormality is evident. The lungs are well aerated and free
of infiltrates. There is minimal central peribronchial thickening.
IMPRESSION: There is minimal central peribronchial thickening.  No peripheral
infiltrate or consolidation is seen.

## 2011-05-28 ENCOUNTER — Ambulatory Visit (INDEPENDENT_AMBULATORY_CARE_PROVIDER_SITE_OTHER): Payer: Medicare Other | Admitting: *Deleted

## 2011-05-28 DIAGNOSIS — I4891 Unspecified atrial fibrillation: Secondary | ICD-10-CM

## 2011-05-28 DIAGNOSIS — I6350 Cerebral infarction due to unspecified occlusion or stenosis of unspecified cerebral artery: Secondary | ICD-10-CM

## 2011-05-28 LAB — POCT INR: INR: 2.4

## 2011-06-14 ENCOUNTER — Other Ambulatory Visit (INDEPENDENT_AMBULATORY_CARE_PROVIDER_SITE_OTHER): Payer: Self-pay | Admitting: Surgery

## 2011-06-14 DIAGNOSIS — Z853 Personal history of malignant neoplasm of breast: Secondary | ICD-10-CM

## 2011-06-21 ENCOUNTER — Encounter (INDEPENDENT_AMBULATORY_CARE_PROVIDER_SITE_OTHER): Payer: Medicare Other

## 2011-06-24 LAB — PROTIME-INR
INR: 3.1 — ABNORMAL HIGH
INR: 3.4 — ABNORMAL HIGH
Prothrombin Time: 33.9 — ABNORMAL HIGH
Prothrombin Time: 36.7 — ABNORMAL HIGH

## 2011-06-24 LAB — BASIC METABOLIC PANEL
Calcium: 9
Creatinine, Ser: 0.94
GFR calc Af Amer: >60
Sodium: 140

## 2011-06-24 LAB — CBC
Hemoglobin: 13.2
RBC: 4.59
WBC: 5.4

## 2011-06-25 ENCOUNTER — Ambulatory Visit (INDEPENDENT_AMBULATORY_CARE_PROVIDER_SITE_OTHER): Payer: Medicare Other | Admitting: *Deleted

## 2011-06-25 ENCOUNTER — Ambulatory Visit: Payer: Medicare Other | Admitting: *Deleted

## 2011-06-25 DIAGNOSIS — I6350 Cerebral infarction due to unspecified occlusion or stenosis of unspecified cerebral artery: Secondary | ICD-10-CM

## 2011-06-25 DIAGNOSIS — I4891 Unspecified atrial fibrillation: Secondary | ICD-10-CM

## 2011-06-28 LAB — COMPREHENSIVE METABOLIC PANEL
AST: 25
Albumin: 2.9 — ABNORMAL LOW
CO2: 25
Calcium: 8.4
Creatinine, Ser: 0.98
GFR calc Af Amer: >60
GFR calc non Af Amer: 57 — ABNORMAL LOW
Sodium: 134 — ABNORMAL LOW
Total Protein: 5.7 — ABNORMAL LOW

## 2011-06-28 LAB — CK TOTAL AND CKMB (NOT AT ARMC)
CK, MB: 0.8
Relative Index: INVALID
Total CK: 88

## 2011-06-28 LAB — URINALYSIS, ROUTINE W REFLEX MICROSCOPIC
Glucose, UA: NEGATIVE
Ketones, ur: NEGATIVE
Nitrite: NEGATIVE
Protein, ur: 30 — AB
pH: 8

## 2011-06-28 LAB — URINE CULTURE: Colony Count: >=100000

## 2011-06-28 LAB — CBC
Platelets: 173
RDW: 14.8
WBC: 8.8

## 2011-06-28 LAB — URINE MICROSCOPIC-ADD ON

## 2011-06-28 LAB — TROPONIN I: Troponin I: 0.14 — ABNORMAL HIGH

## 2011-06-28 LAB — TSH: TSH: 1.113

## 2011-06-28 LAB — MAGNESIUM: Magnesium: 1.9

## 2011-06-28 LAB — DIFFERENTIAL
Eosinophils Relative: 2
Lymphocytes Relative: 8 — ABNORMAL LOW
Lymphs Abs: 0.7
Monocytes Relative: 8

## 2011-06-28 LAB — APTT: aPTT: 45 — ABNORMAL HIGH

## 2011-06-28 LAB — PROTIME-INR: INR: 2.6 — ABNORMAL HIGH (ref 0.00–1.49)

## 2011-07-03 ENCOUNTER — Other Ambulatory Visit: Payer: Self-pay | Admitting: Oncology

## 2011-07-03 ENCOUNTER — Encounter (HOSPITAL_BASED_OUTPATIENT_CLINIC_OR_DEPARTMENT_OTHER): Payer: Medicare Other | Admitting: Oncology

## 2011-07-03 DIAGNOSIS — D0590 Unspecified type of carcinoma in situ of unspecified breast: Secondary | ICD-10-CM

## 2011-07-03 LAB — COMPREHENSIVE METABOLIC PANEL
ALT: 18 U/L (ref 0–35)
AST: 22 U/L (ref 0–37)
Alkaline Phosphatase: 69 U/L (ref 39–117)
Sodium: 140 mEq/L (ref 135–145)
Total Bilirubin: 0.6 mg/dL (ref 0.3–1.2)
Total Protein: 6.5 g/dL (ref 6.0–8.3)

## 2011-07-03 LAB — CBC WITH DIFFERENTIAL/PLATELET
BASO%: 0.6 % (ref 0.0–2.0)
LYMPH%: 16.4 % (ref 14.0–49.7)
MCHC: 34 g/dL (ref 31.5–36.0)
MCV: 89.6 fL (ref 79.5–101.0)
MONO#: 0.4 10*3/uL (ref 0.1–0.9)
MONO%: 9.6 % (ref 0.0–14.0)
Platelets: 169 10*3/uL (ref 145–400)
RBC: 4.13 10*6/uL (ref 3.70–5.45)
WBC: 4.6 10*3/uL (ref 3.9–10.3)

## 2011-07-23 ENCOUNTER — Telehealth: Payer: Self-pay | Admitting: Cardiology

## 2011-07-23 ENCOUNTER — Ambulatory Visit (INDEPENDENT_AMBULATORY_CARE_PROVIDER_SITE_OTHER): Payer: Medicare Other | Admitting: *Deleted

## 2011-07-23 ENCOUNTER — Ambulatory Visit
Admission: RE | Admit: 2011-07-23 | Discharge: 2011-07-23 | Disposition: A | Discharge status: Home / Self Care | Payer: Medicare Other | Source: Ambulatory Visit | Attending: Surgery | Admitting: Surgery

## 2011-07-23 ENCOUNTER — Other Ambulatory Visit (INDEPENDENT_AMBULATORY_CARE_PROVIDER_SITE_OTHER): Payer: Self-pay | Admitting: Surgery

## 2011-07-23 DIAGNOSIS — Z853 Personal history of malignant neoplasm of breast: Secondary | ICD-10-CM

## 2011-07-23 DIAGNOSIS — Z7901 Long term (current) use of anticoagulants: Secondary | ICD-10-CM

## 2011-07-23 DIAGNOSIS — I4891 Unspecified atrial fibrillation: Secondary | ICD-10-CM

## 2011-07-23 DIAGNOSIS — I6350 Cerebral infarction due to unspecified occlusion or stenosis of unspecified cerebral artery: Secondary | ICD-10-CM

## 2011-07-23 NOTE — Telephone Encounter (Signed)
Pt saw Dr Dannielle Huh and he recommended pt start taking Celebrex daily for her severe osteoarthritis.  Pt mentioned she was on Coumadin and was not supposed to take anti-inflammatory medications in conjunction with, he recommended pt discuss with her cardiologist and if he felt safe to take then to have him rx.  Pt's pharmacy is [facility].  Please call pt and advise if safe to take or if alternative medication recommended. Thanks

## 2011-07-29 NOTE — Telephone Encounter (Signed)
Data suggest that bleeding should not be increased with coumadin  There is a risk of ischemia but hopefully that is small and the exposrue to the drug is limited

## 2011-08-02 NOTE — Telephone Encounter (Signed)
LMTC

## 2011-08-07 ENCOUNTER — Encounter: Payer: Self-pay | Admitting: *Deleted

## 2011-08-07 ENCOUNTER — Other Ambulatory Visit: Payer: Self-pay | Admitting: Internal Medicine

## 2011-08-07 NOTE — Telephone Encounter (Signed)
Letter mailed to the patient

## 2011-08-19 IMAGING — RF DG UGI W/ KUB
15 of 18 series · 19 of 24 positions shown · non-contrast
Comparison: None.

CLINICAL DATA: Obesity; preoperative screening for bariatric
procedure

UPPER GI SERIES WITH KUB
TECHNIQUE: Routine upper GI series was performed with barium.

[Series 1: run · 2 of 28 slices shown (1 of 15)]
[im 1/28]
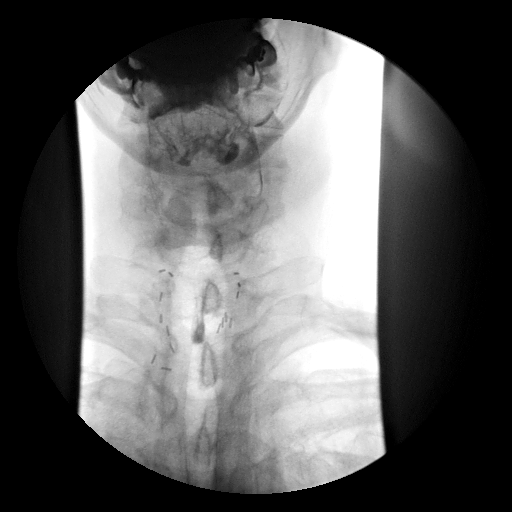
[im 14/28]
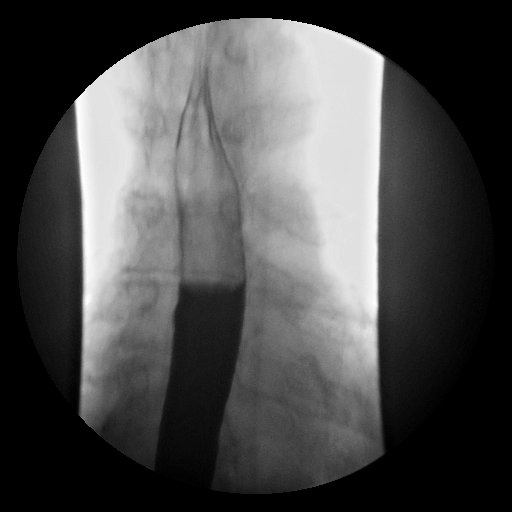

[Series 2: run · 4 of 32 slices shown (2 of 15)]
[im 1/32]
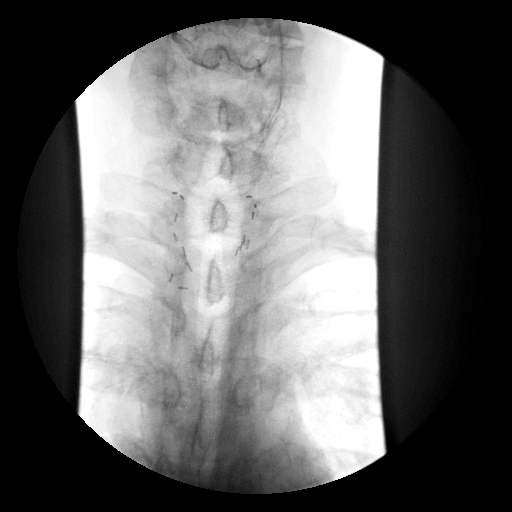
[im 11/32]
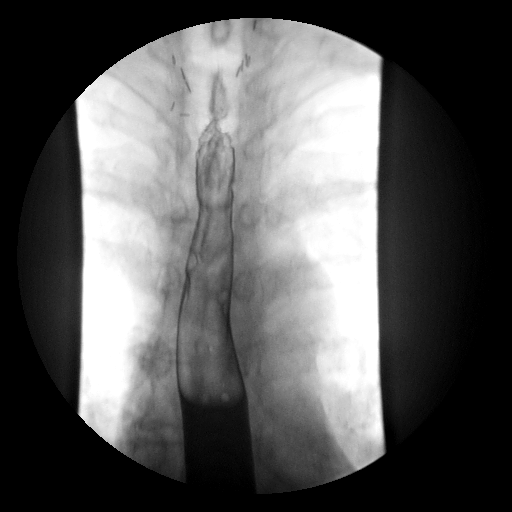
[im 21/32]
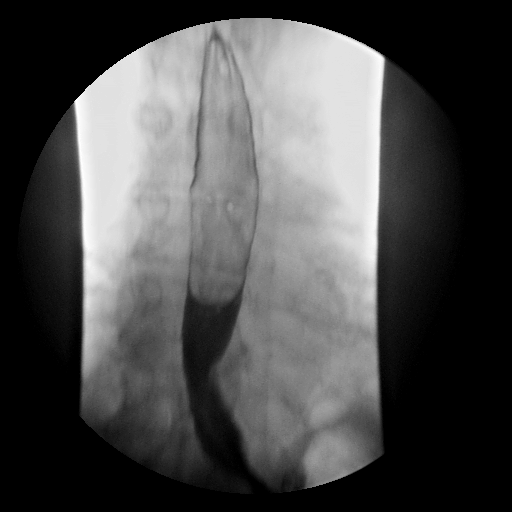
[im 32/32]
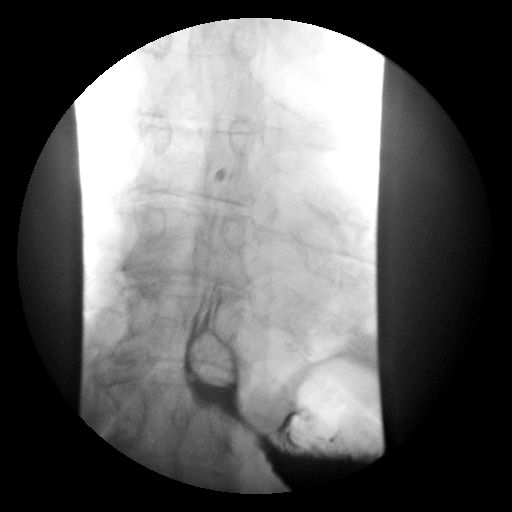

[Series 3: run · 1 of 20 slices shown (3 of 15)]
[im 20/20]
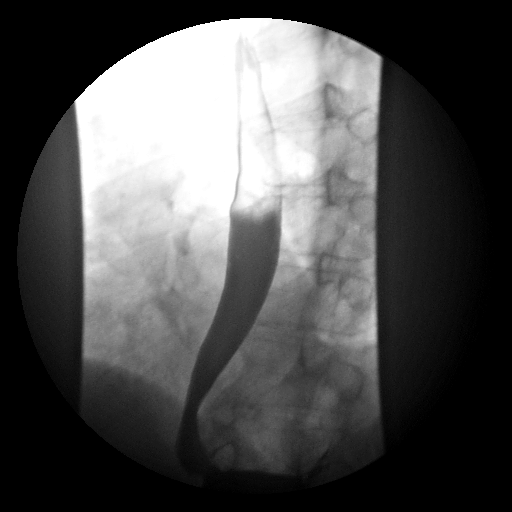

[Series 4: run · 1 of 1 slices shown (4 of 15)]
[im 1/1]
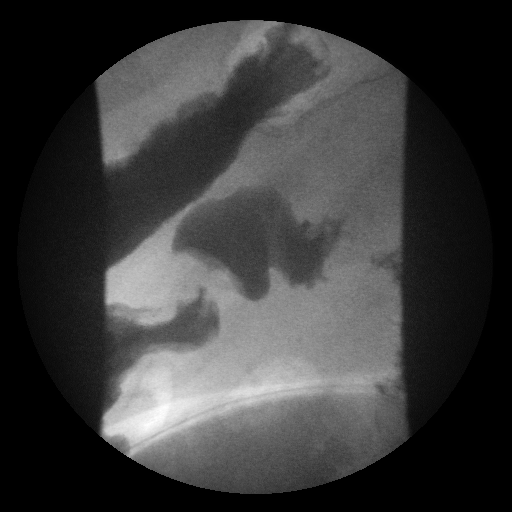

[Series 5: run · 1 of 1 slices shown (5 of 15)]
[im 1/1]
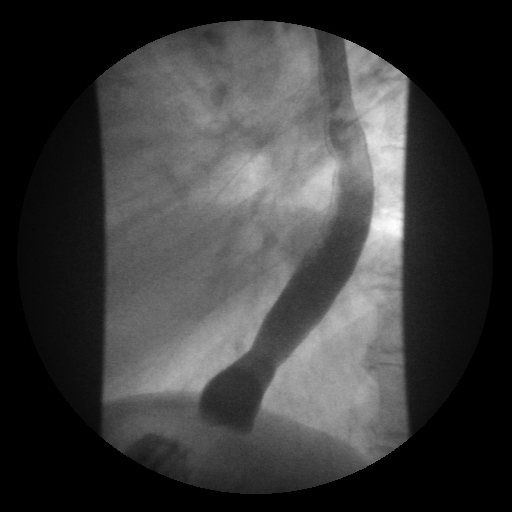

[Series 7: run · 1 of 1 slices shown (6 of 15)]
[im 1/1]
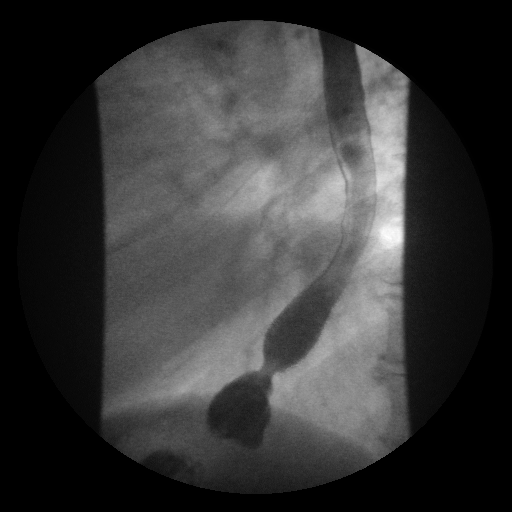

[Series 8: run · 1 of 1 slices shown (7 of 15)]
[im 1/1]
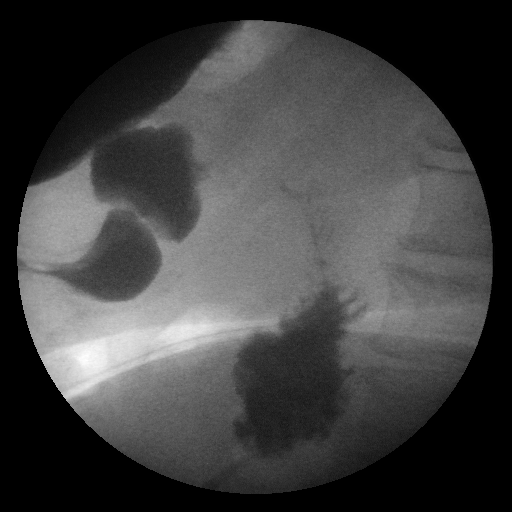

[Series 9: run · 1 of 1 slices shown (8 of 15)]
[im 1/1]
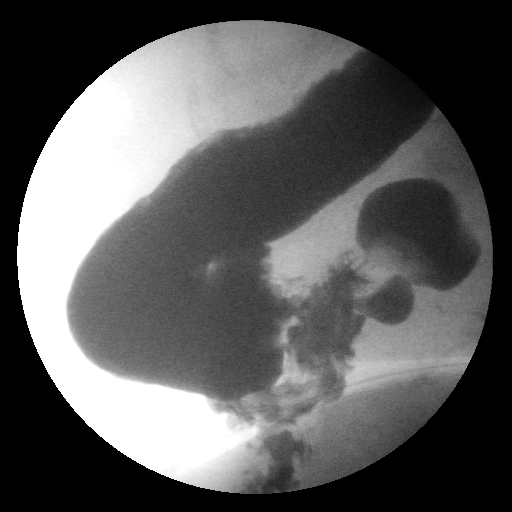

[Series 10: run · 1 of 1 slices shown (9 of 15)]
[im 1/1]
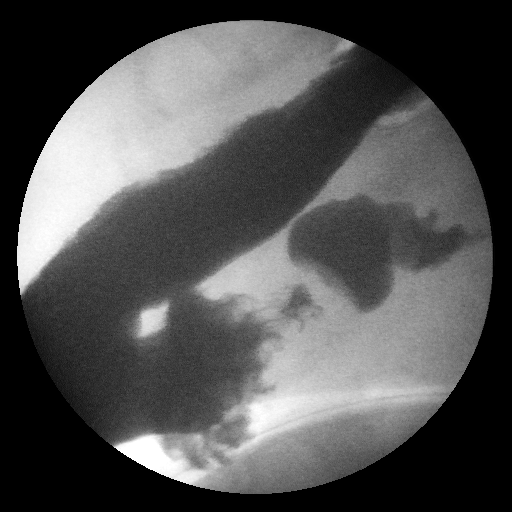

[Series 12: run · 1 of 1 slices shown (10 of 15)]
[im 1/1]
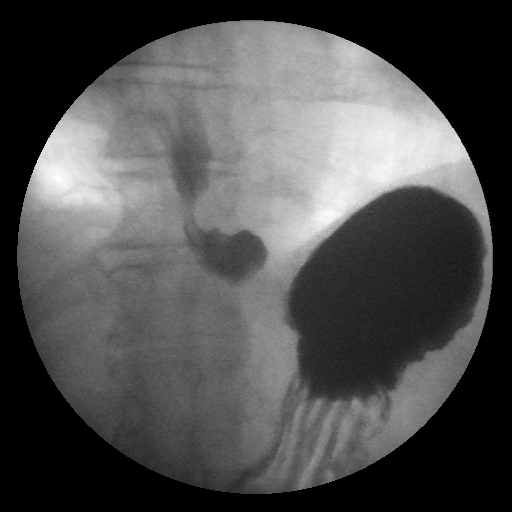

[Series 13: run · 1 of 1 slices shown (11 of 15)]
[im 1/1]
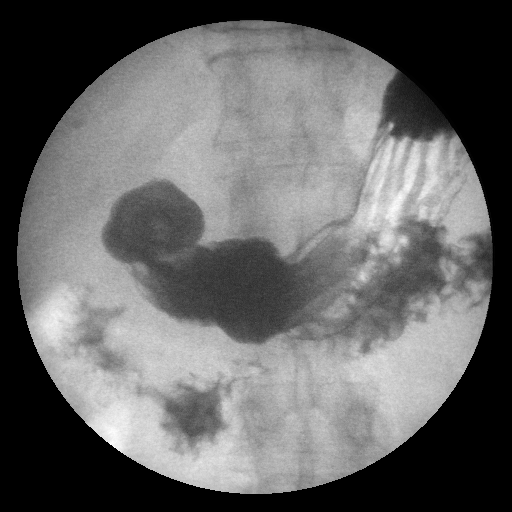

[Series 14: run · 1 of 1 slices shown (12 of 15)]
[im 1/1]
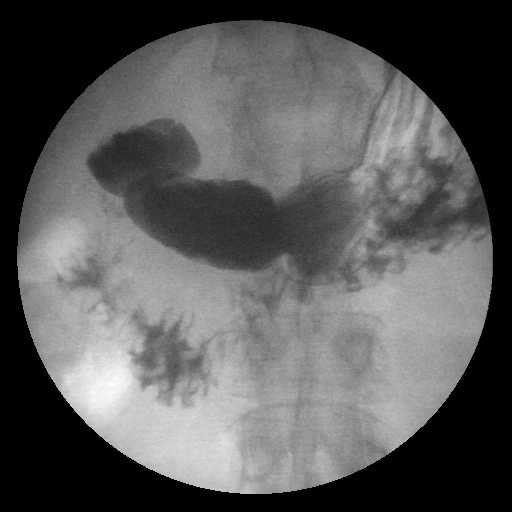

[Series 15: run · 1 of 1 slices shown (13 of 15)]
[im 1/1]
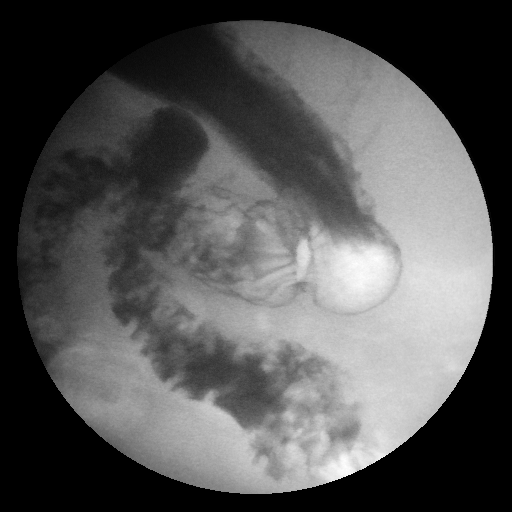

[Series 17: run · 1 of 1 slices shown (14 of 15)]
[im 1/1]
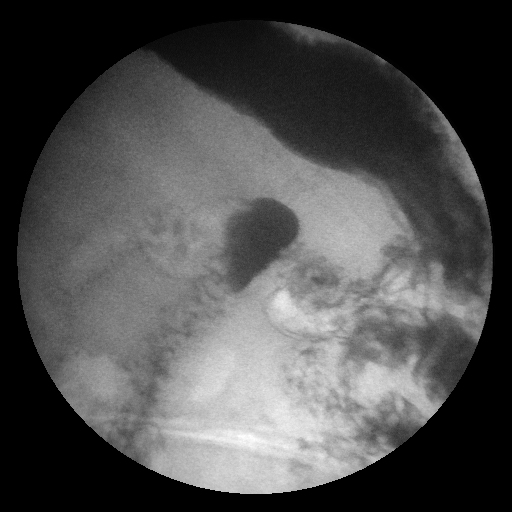

[Series 18: run · 1 of 1 slices shown (15 of 15)]
[im 1/1]
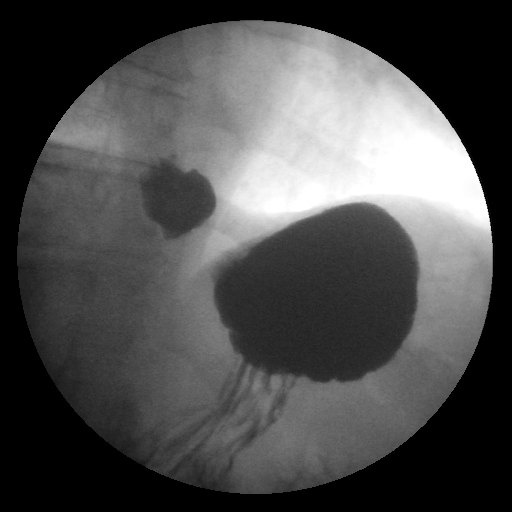

[19 of 24 positions shown; findings below may reference images not displayed]

FINDINGS: On the KUB, there is a normal stool and bowel gas
pattern.

There is a small hiatal hernia.  A small amount of reflux is seen
at the EG junction.  There are tertiary contractions throughout the
esophagus.  No fixed filling defects, ulcerations, or strictures
are seen within the esophagus, stomach, duodenal bulb, or remainder
of the C-loop.  Surgical clips overlie the common carotid regions
of the neck.
IMPRESSION: Small hiatal hernia with reflux at the EG junction

Tertiary contractions within the esophagus.

## 2011-08-27 IMAGING — CR DG CHEST 2V
2 series · 2 of 2 positions shown · non-contrast
Comparison: 10/10/2009

CLINICAL DATA: Preop for lap band.

CHEST - 2 VIEW

[view not recorded (1 of 2)]
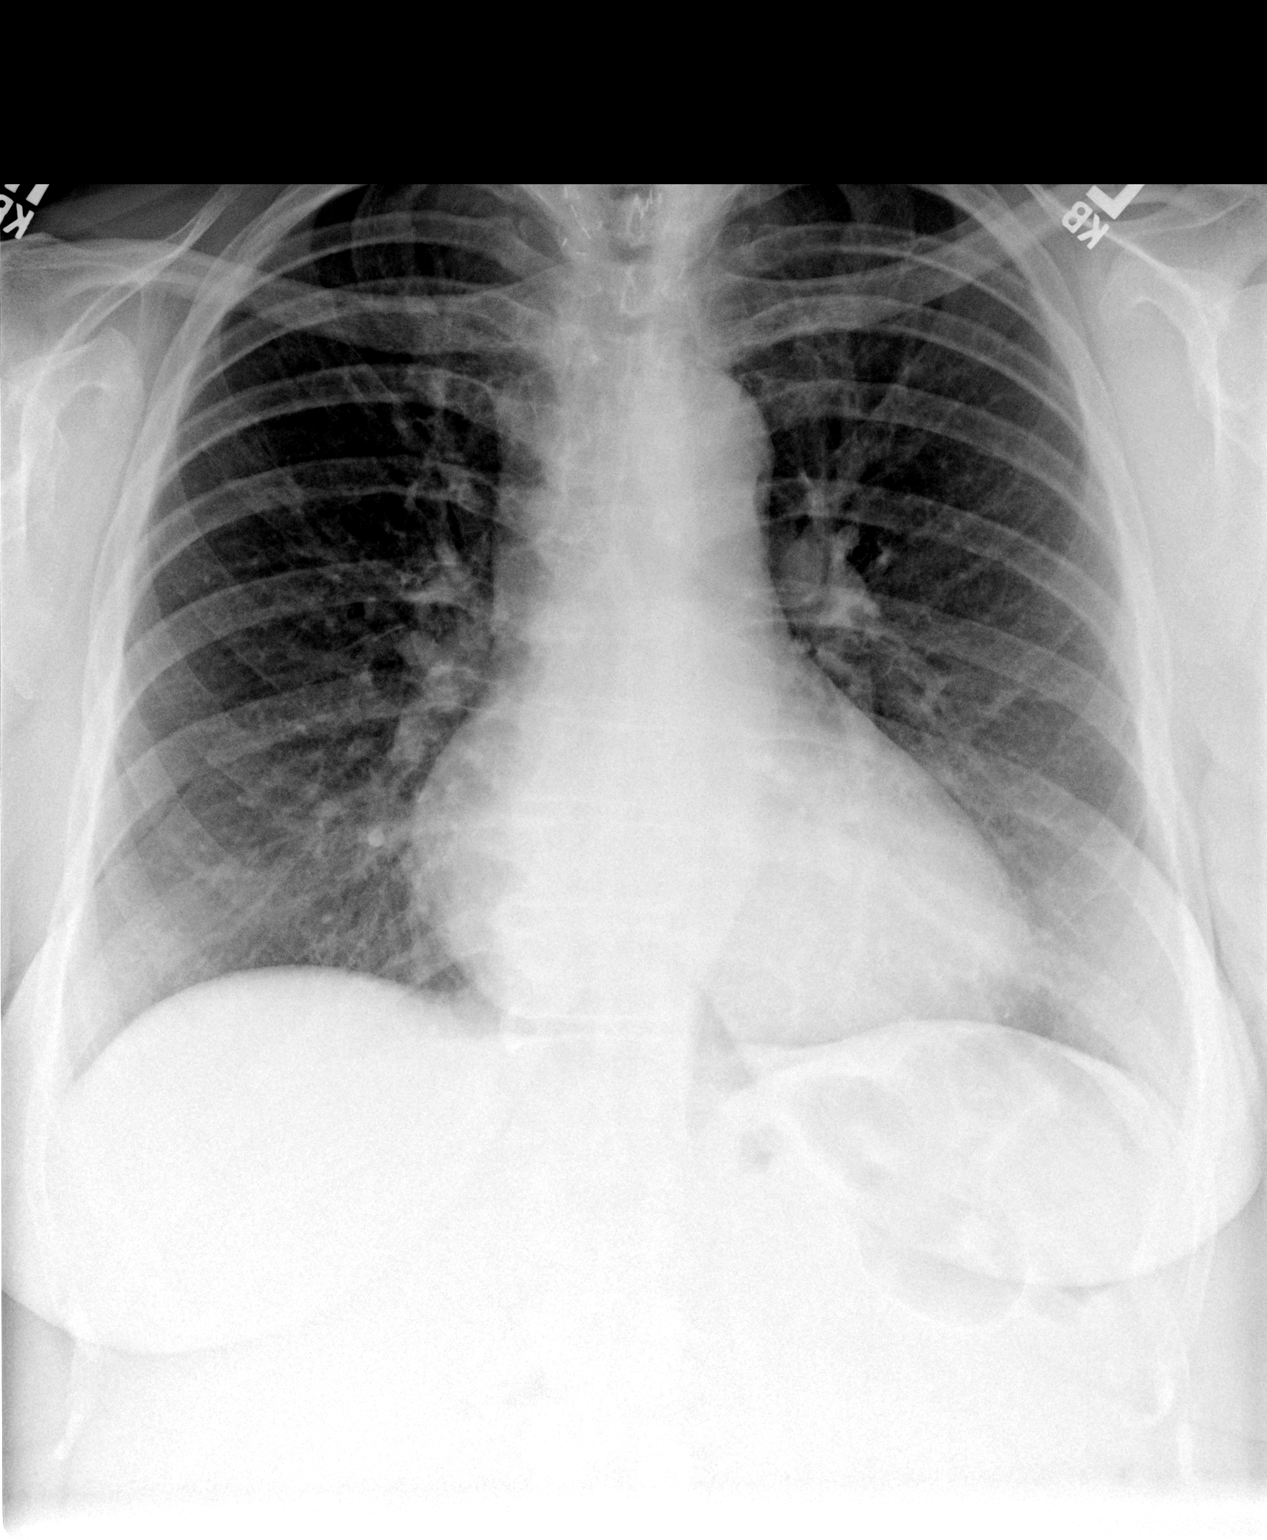

[view not recorded (2 of 2)]
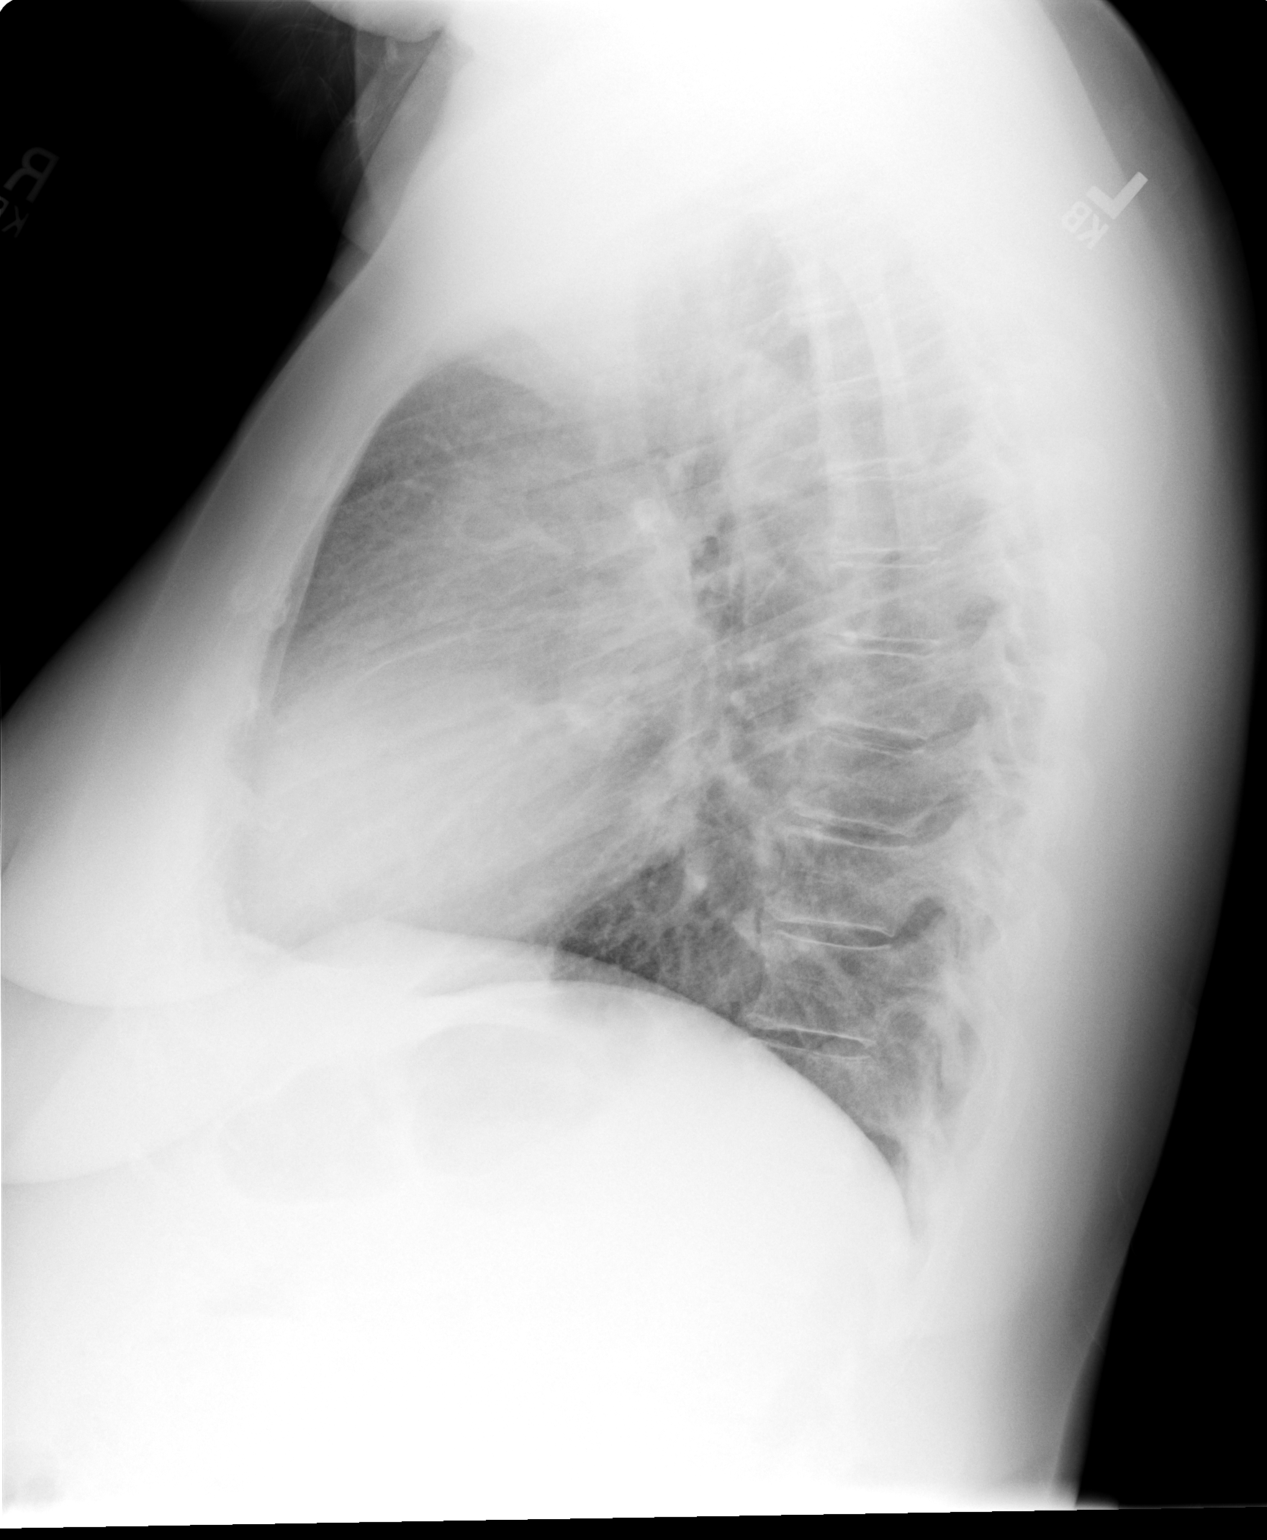

[2 of 2 positions shown; findings below may reference images not displayed]

FINDINGS: The cardiac silhouette, mediastinal and hilar contours
are within normal limits.  The lungs are clear.  The bony thorax is
intact.  Surgical changes are noted in the lower neck from probable
previous thyroidectomy.
IMPRESSION: No acute cardiopulmonary findings.

## 2011-09-03 ENCOUNTER — Ambulatory Visit (INDEPENDENT_AMBULATORY_CARE_PROVIDER_SITE_OTHER): Payer: Medicare Other | Admitting: *Deleted

## 2011-09-03 DIAGNOSIS — Z7901 Long term (current) use of anticoagulants: Secondary | ICD-10-CM

## 2011-09-03 DIAGNOSIS — I6350 Cerebral infarction due to unspecified occlusion or stenosis of unspecified cerebral artery: Secondary | ICD-10-CM

## 2011-09-03 DIAGNOSIS — I4891 Unspecified atrial fibrillation: Secondary | ICD-10-CM

## 2011-09-26 ENCOUNTER — Other Ambulatory Visit: Payer: Self-pay

## 2011-09-26 ENCOUNTER — Encounter (INDEPENDENT_AMBULATORY_CARE_PROVIDER_SITE_OTHER): Payer: Self-pay | Admitting: Surgery

## 2011-09-26 MED ORDER — PRAVASTATIN SODIUM 20 MG PO TABS: 20.0000 mg | ORAL_TABLET | Freq: Every day | ORAL | Status: DC

## 2011-09-27 ENCOUNTER — Encounter (INDEPENDENT_AMBULATORY_CARE_PROVIDER_SITE_OTHER): Payer: Self-pay

## 2011-09-27 ENCOUNTER — Ambulatory Visit (INDEPENDENT_AMBULATORY_CARE_PROVIDER_SITE_OTHER): Payer: Medicare Other | Admitting: Physician Assistant

## 2011-09-27 VITALS — BP 142/84 | HR 68 | Temp 97.1°F | Resp 16 | Ht 66.5 in | Wt 221.0 lb

## 2011-09-27 DIAGNOSIS — Z4651 Encounter for fitting and adjustment of gastric lap band: Secondary | ICD-10-CM

## 2011-09-27 NOTE — Patient Instructions (Signed)
Take clear liquids for the next 48 hours. Thin protein shakes are ok to start on Saturday evening. Call us if you have persistent vomiting or regurgitation, night cough or reflux symptoms. Return as scheduled or sooner if you notice no changes in hunger/portion sizes.   

## 2011-09-27 NOTE — Progress Notes (Signed)
  HISTORY: Shikha Bibb is a 66 y.o.female who received an AP-Standard lap-band in October 2011 by Dr. Daphine Deutscher. She comes in with increased carbohydrate intake and overall larger portions. No vomiting or regurgitation.  VITAL SIGNS: Filed Vitals:   09/27/11 1133  BP: 142/84  Pulse: 68  Temp: 97.1 F (36.2 C)  Resp: 16    PHYSICAL EXAM: Physical exam reveals a very well-appearing 66 y.o.female in no apparent distress Neurologic: Awake, alert, oriented Psych: Bright affect, conversant Respiratory: Breathing even and unlabored. No stridor or wheezing Abdomen: Soft, nontender, nondistended to palpation. Incisions well-healed. No incisional hernias. Port easily palpated. Extremities: Atraumatic, good range of motion.  ASSESMENT: 66 y.o.  female  s/p AP-Standard lap-band.   PLAN: The patient's port was accessed with a 20G Huber needle without difficulty. Clear fluid was aspirated and 0.5 mL saline was added to the port. The patient was able to swallow water without difficulty following the procedure and was instructed to take clear liquids for the next 24-48 hours and advance slowly as tolerated.

## 2011-10-01 ENCOUNTER — Ambulatory Visit (INDEPENDENT_AMBULATORY_CARE_PROVIDER_SITE_OTHER): Payer: Medicare Other | Admitting: *Deleted

## 2011-10-01 DIAGNOSIS — I6350 Cerebral infarction due to unspecified occlusion or stenosis of unspecified cerebral artery: Secondary | ICD-10-CM

## 2011-10-01 DIAGNOSIS — Z7901 Long term (current) use of anticoagulants: Secondary | ICD-10-CM | POA: Diagnosis not present

## 2011-10-01 DIAGNOSIS — I4891 Unspecified atrial fibrillation: Secondary | ICD-10-CM | POA: Diagnosis not present

## 2011-10-03 ENCOUNTER — Ambulatory Visit (INDEPENDENT_AMBULATORY_CARE_PROVIDER_SITE_OTHER): Payer: Medicare Other | Admitting: Physician Assistant

## 2011-10-03 ENCOUNTER — Encounter: Payer: Self-pay | Admitting: Physician Assistant

## 2011-10-03 VITALS — BP 132/68 | HR 58 | Resp 18 | Ht 66.0 in | Wt 223.0 lb

## 2011-10-03 DIAGNOSIS — I6350 Cerebral infarction due to unspecified occlusion or stenosis of unspecified cerebral artery: Secondary | ICD-10-CM

## 2011-10-03 DIAGNOSIS — I4891 Unspecified atrial fibrillation: Secondary | ICD-10-CM | POA: Diagnosis not present

## 2011-10-03 DIAGNOSIS — R42 Dizziness and giddiness: Secondary | ICD-10-CM

## 2011-10-03 DIAGNOSIS — M79609 Pain in unspecified limb: Secondary | ICD-10-CM | POA: Diagnosis not present

## 2011-10-03 DIAGNOSIS — M898X9 Other specified disorders of bone, unspecified site: Secondary | ICD-10-CM | POA: Diagnosis not present

## 2011-10-03 DIAGNOSIS — I5032 Chronic diastolic (congestive) heart failure: Secondary | ICD-10-CM

## 2011-10-03 DIAGNOSIS — I509 Heart failure, unspecified: Secondary | ICD-10-CM

## 2011-10-03 NOTE — Assessment & Plan Note (Signed)
Etiology unclear.  Proceed with event monitor as noted.  She's apparently had a full neurologic workup without significant findings.

## 2011-10-03 NOTE — Patient Instructions (Addendum)
Your physician recommends that you continue on your current medications as directed. Please refer to the Current Medication list given to you today.   Your physician has recommended that you wear an event monitor. Event monitors are medical devices that record the heart's electrical activity. Doctors most often [place] these monitors to diagnose arrhythmias. Arrhythmias are problems with the speed or rhythm of the heartbeat. The monitor is a small, portable device. You can wear one while you do your normal daily activities. This is usually used to diagnose what is causing palpitations/syncope (passing out).  Your physician recommends that you schedule a follow-up appointment in: 4-6 weeks with Dr. Graciela Husbands

## 2011-10-03 NOTE — Assessment & Plan Note (Signed)
Volume stable. 

## 2011-10-03 NOTE — Assessment & Plan Note (Signed)
Continue Coumadin therapy. 

## 2011-10-03 NOTE — Progress Notes (Signed)
[address] [place], [place]  [address] Phone: [phone] Fax:  [phone]  Date:  10/03/2011   Name:  Kayla Harrison       DOB:  1945-11-24 MRN:  [id number]  PCP:  Dr. Alwyn Ren Primary Cardiologist:  Dr. Sherryl Manges  Primary Electrophysiologist:  Dr. Sherryl Manges    History of Present Illness: Kayla Harrison is a 66 y.o. female presents for follow up.  She has a history of prior stroke (in [place] while off coumadin), atrial fibrillation, status post ablation at [facility] on Coumadin and amiodarone therapy as well as diastolic heart failure.  She is s/p lap band procedure on 10/11 for obesity. Echo 10/11:  EF 55-60% and mild left atrial enlargement.  Last seen by Dr. Sherryl Manges in 4/12.  Amiodarone was decreased to 5 days a week in the setting of bradycardia.  Her aspirin was discontinued.  Plan was to followup in one year.  She has had episodes recently that remind her of how she felt prior to going into AFib.  She denies a feeling of going into Afib.  The patient denies chest pain, shortness of breath, syncope, orthopnea, PND or significant pedal edema.  She continues to have occasional dizziness or lightheadedness.  She has had a full workup with a neurologist that was normal.  She feels dizzy if she turns her head but is not sure of what direction.  I massaged her carotids but did not elicit dizziness.    Past Medical History  Diagnosis Date  . Atrial fibrillation     Status post pulmonary vein isolation  . Morbid obesity     Status post lap band  . Hx of thyroid cancer   . Breast cancer   . Colon polyp   . Dyslipidemia   . Stroke   . Thyroid disease     CANCER  . Hyperlipidemia   . Stroke     Current Outpatient Prescriptions  Medication Sig Dispense Refill  . amiodarone (PACERONE) 200 MG tablet TAKE 5 X A WEEK      . Cholecalciferol (VITAMIN D3) 1000 UNITS CAPS Take by mouth daily.        . Diphenhydramine-APAP, sleep, (TYLENOL PM  EXTRA STRENGTH PO) Take by mouth as directed.        . furosemide (LASIX) 40 MG tablet Take 40 mg by mouth as needed.       Marland Kitchen levothyroxine (SYNTHROID, LEVOTHROID) 175 MCG tablet Take 175 mcg by mouth daily. Take 6 days a week.      . Multiple Vitamins-Minerals (ADEKS) chewable tablet Chew 1 tablet by mouth daily.        . Omega-3 Fatty Acids (FISH OIL) 1000 MG CAPS Take 1 capsule by mouth daily.        . pravastatin (PRAVACHOL) 20 MG tablet Take 1 tablet (20 mg total) by mouth daily. Take 1 tablet at bedtime.  90 tablet  1  . warfarin (COUMADIN) 5 MG tablet TAKE AS DIRECTED BY COUMADIN CLINIC  30 tablet  3  . calcium carbonate (TUMS - DOSED IN MG ELEMENTAL CALCIUM) 500 MG chewable tablet Chew 1 tablet by mouth 2 (two) times daily.          Allergies: Allergies  Allergen Reactions  . Epinephrine Other (See Comments)    Dental form only (liquid). Patient stated she will become "out of it" she can hear you but cannot respond in normal fashion.    History  Substance Use Topics  . Smoking status: Never Smoker   . Smokeless tobacco: Never Used  . Alcohol Use: No     ROS:  Please see the history of present illness.   All other systems reviewed and negative.   PHYSICAL EXAM: VS:  BP 132/68  Pulse 58  Resp 18  Ht 5\' 6"  (1.676 m)  Wt 223 lb (101.152 kg)  BMI 35.99 kg/m2 Well nourished, well developed, in no acute distress HEENT: normal Neck: no JVD Vascular: no carotid bruits Cardiac:  normal S1, S2; RRR; no murmur Lungs:  clear to auscultation bilaterally, no wheezing, rhonchi or rales Abd: soft, nontender, no hepatomegaly Ext: no edema Skin: warm and dry Neuro:  CNs 2-12 intact, no focal abnormalities noted  EKG:   Sinus bradycardia, heart rate 58, normal axis, no ischemic changes  ASSESSMENT AND PLAN:

## 2011-10-03 NOTE — Assessment & Plan Note (Signed)
Maintaining sinus rhythm.  Her amiodarone was decreased last time due to bradycardia.  She still has some bradycardia.  I discussed her case with Dr. Sherryl Manges.  We will not change her medications at this time.  We will arrange for her to have an event monitor.  She will followup with Dr. Sherryl Manges in 4-6 weeks.

## 2011-10-04 ENCOUNTER — Encounter: Payer: Self-pay | Admitting: Physician Assistant

## 2011-10-08 ENCOUNTER — Encounter (INDEPENDENT_AMBULATORY_CARE_PROVIDER_SITE_OTHER): Payer: Medicare Other

## 2011-10-08 DIAGNOSIS — I4891 Unspecified atrial fibrillation: Secondary | ICD-10-CM | POA: Diagnosis not present

## 2011-10-29 ENCOUNTER — Ambulatory Visit (INDEPENDENT_AMBULATORY_CARE_PROVIDER_SITE_OTHER): Payer: Medicare Other | Admitting: *Deleted

## 2011-10-29 DIAGNOSIS — I6350 Cerebral infarction due to unspecified occlusion or stenosis of unspecified cerebral artery: Secondary | ICD-10-CM

## 2011-10-29 DIAGNOSIS — I4891 Unspecified atrial fibrillation: Secondary | ICD-10-CM | POA: Diagnosis not present

## 2011-10-29 DIAGNOSIS — Z7901 Long term (current) use of anticoagulants: Secondary | ICD-10-CM | POA: Diagnosis not present

## 2011-11-08 ENCOUNTER — Ambulatory Visit (INDEPENDENT_AMBULATORY_CARE_PROVIDER_SITE_OTHER): Payer: Medicare Other | Admitting: Internal Medicine

## 2011-11-08 VITALS — BP 140/82 | HR 61 | Temp 98.7°F | Wt 224.0 lb

## 2011-11-08 DIAGNOSIS — J019 Acute sinusitis, unspecified: Secondary | ICD-10-CM

## 2011-11-08 DIAGNOSIS — R040 Epistaxis: Secondary | ICD-10-CM

## 2011-11-08 DIAGNOSIS — J209 Acute bronchitis, unspecified: Secondary | ICD-10-CM | POA: Diagnosis not present

## 2011-11-08 MED ORDER — BECLOMETHASONE DIPROPIONATE 80 MCG/ACT IN AERS: 1.0000 | INHALATION_SPRAY | Freq: Two times a day (BID) | RESPIRATORY_TRACT | Status: DC

## 2011-11-08 MED ORDER — AMOXICILLIN 500 MG PO CAPS: 500.0000 mg | ORAL_CAPSULE | Freq: Three times a day (TID) | ORAL | Status: AC

## 2011-11-08 NOTE — Progress Notes (Signed)
  Subjective:    Patient ID: Kayla Harrison, female    DOB: June 11, 1946, 66 y.o.   MRN: [id number]  HPI Respiratory tract infection Onset/symptoms:2/4 as sneezing  Exposures (illness/environmental/extrinsic):no Progression of symptoms:to productive cough Treatments/response:OTC with some benefit Present symptoms: Fever/chills/sweats:no but "cold" Frontal headache:yes Facial pain:yes Nasal purulence:yes Sore throat:yes Dental pain:no Lymphadenopathy:yes Wheezing/shortness of breath:wheezing with cough Cough/sputum/hemoptysis:yellow w/o hemoptysis Pleuritic pain:no Associated extrinsic/allergic symptoms:itchy eyes/ sneezing:not now Past medical history: Seasonal allergies/asthma:no Smoking history:no , but history of pneumonia 2 years ago which resulted in a 1week hospitalization complicated by atrial fibrillation           Review of Systems As noted she is not having hemoptysis, but she has had some epistaxis with the present illness. She has had some blood in her stool which she relates to straining.  She fell and bruised her right lower extremity. She has no abnormal bruising or bleeding other than  the epistaxis. Her last PT/INR was 2.4 last week.     Objective:   Physical Exam General appearance:good health ;well nourished; no acute distress or increased work of breathing is present.  No  lymphadenopathy about the head, neck, or axilla noted.  Tender over carotid bodies  Eyes: No conjunctival inflammation or lid edema is present.   Ears:  External ear exam shows no significant lesions or deformities.  Otoscopic examination reveals clear canals, tympanic membranes are intact bilaterally without bulging, retraction, inflammation or discharge.TMs dull; R slightly scarred  Nose:  External nasal examination shows no deformity or inflammation. Nasal mucosa are dry without lesions or exudates. No septal dislocation or deviation.No obstruction to airflow.   Oral exam:  Dental hygiene is good; lips and gums are healthy appearing.There is no oropharyngeal erythema or exudate noted.   Neck:  No deformities, thyromegaly, masses, or tenderness noted.   Supple with full range of motion without pain.   Heart:  Normal rate and regular rhythm. Accentuated  S2 normal without gallop,  click, rub or other extra sounds. Grade 1/6 systolic murmur   Lungs:Chest clear to auscultation; no rhonchi or rubs present. There is expiratory wheezing noted upper airway with forced expiration only. At bases there are  minor rales.No increased work of breathing.    Extremities:  No cyanosis or clubbing  noted  Trace edema   Skin: Warm & dry w/o jaundice or tenting.         Assessment & Plan:  #1 rhinosinusitis  #2 bronchitis, acute with mild bronchospastic component  #3 epistaxis related to nasal drying.  Plan: See orders and recommendations.

## 2011-11-08 NOTE — Patient Instructions (Signed)
Plain Mucinex for thick secretions ;force NON dairy fluids for next 48 hrs. Use a Neti pot daily as needed for sinus congestion. Nasal cleansing in the shower as discussed. Make sure that all residual soap is removed to prevent irritation. QVAR  one inhalation every 12 hours; gargle and spit after use.Use Eucerin   twice a day  for the drying.

## 2011-11-14 ENCOUNTER — Encounter: Payer: Self-pay | Admitting: Internal Medicine

## 2011-11-14 ENCOUNTER — Ambulatory Visit (INDEPENDENT_AMBULATORY_CARE_PROVIDER_SITE_OTHER): Payer: Medicare Other | Admitting: Internal Medicine

## 2011-11-14 DIAGNOSIS — I5032 Chronic diastolic (congestive) heart failure: Secondary | ICD-10-CM

## 2011-11-14 DIAGNOSIS — I6350 Cerebral infarction due to unspecified occlusion or stenosis of unspecified cerebral artery: Secondary | ICD-10-CM | POA: Diagnosis not present

## 2011-11-14 DIAGNOSIS — I4891 Unspecified atrial fibrillation: Secondary | ICD-10-CM | POA: Diagnosis not present

## 2011-11-14 DIAGNOSIS — I509 Heart failure, unspecified: Secondary | ICD-10-CM

## 2011-11-14 NOTE — Assessment & Plan Note (Signed)
No intercurrent events. She would like to consider changing anticoagulants. We will defer this until she returned from [place] and at this point would anticipate the use apixoban.

## 2011-11-14 NOTE — Patient Instructions (Signed)
Your physician wants you to follow-up in: 12 months with Dr Logan Bores will receive a reminder letter in the mail two months in advance. If you don't receive a letter, please call our office to schedule the follow-up appointment.   Your physician recommends that you return for lab work in:  1--order given for liver panel to be drawn at [facility] office

## 2011-11-14 NOTE — Assessment & Plan Note (Signed)
Stable currently no recurrence of atrial fibrillation. Some PACs. At this point we will continue her on her current dose of amiodarone. I would like to down titrated further but will wait to make sure there is no recurrence of atrial fibrillation in the interim.  Amiodarone surveillance labs are due. She'll have been drawn next week with Dr. Evlyn Kanner.

## 2011-11-14 NOTE — Progress Notes (Signed)
HPI  Kayla Harrison is a 66 y.o. female Seen in followup for atrial fibrillatio for which she underwent pulmonary vein isolation at [facility]  She has a history of prior strokes and remains on oral anticoagulation  Echo 2011 EF 55% with mod LAE-27mm  She also has a history of morbid obesity status post lap band surgery     Been having some palpitations and so we undertook an event monitor demonstrated only PACs that were occasionally occur in flurries. Heart rate excursions to be quite good   Past Medical History  Diagnosis Date  . Atrial fibrillation     Status post pulmonary vein isolation  . Morbid obesity     Status post lap band  . Hx of thyroid cancer   . Breast cancer   . Colon polyp   . Dyslipidemia   . Stroke   . Thyroid disease     CANCER  . Hyperlipidemia   . Stroke     Past Surgical History  Procedure Date  . Thyroidectomy 1998  . Bso 1998  . Cholecystectomy   . Tonsillectomy   . Colonoscopy w/ polypectomy   . Afib ablation   . Abdominal hysterectomy 1983  . Breast lumpectomy 2010  . Hernia repair   . Laparoscopic gastric banding 07/10/2010  . Appendectomy     Current Outpatient Prescriptions  Medication Sig Dispense Refill  . amiodarone (PACERONE) 200 MG tablet TAKE 5 X A WEEK      . amoxicillin (AMOXIL) 500 MG capsule Take 1 capsule (500 mg total) by mouth 3 (three) times daily.  30 capsule  0  . calcium carbonate (TUMS - DOSED IN MG ELEMENTAL CALCIUM) 500 MG chewable tablet Chew 1 tablet by mouth 2 (two) times daily.        . celecoxib (CELEBREX) 200 MG capsule Take 200 mg by mouth daily.      . Cholecalciferol (VITAMIN D3) 1000 UNITS CAPS Take by mouth daily.        . Diphenhydramine-APAP, sleep, (TYLENOL PM EXTRA STRENGTH PO) Take by mouth as directed.        . furosemide (LASIX) 40 MG tablet Take 40 mg by mouth as needed.       Marland Kitchen levothyroxine (SYNTHROID, LEVOTHROID) 175 MCG tablet Take 175 mcg by mouth daily. Take 6 days a week.      .  Multiple Vitamins-Minerals (ADEKS) chewable tablet Chew 1 tablet by mouth daily.        . Omega-3 Fatty Acids (FISH OIL) 1000 MG CAPS Take 1 capsule by mouth daily.        . pravastatin (PRAVACHOL) 20 MG tablet Take 1 tablet (20 mg total) by mouth daily. Take 1 tablet at bedtime.  90 tablet  1  . warfarin (COUMADIN) 5 MG tablet TAKE AS DIRECTED BY COUMADIN CLINIC  30 tablet  3    Allergies  Allergen Reactions  . Epinephrine Other (See Comments)    Dental form only (liquid). Patient stated she will become "out of it" she can hear you but cannot respond in normal fashion.    Review of Systems negative except from HPI and PMH  Physical Exam BP 135/75  Pulse 69  Ht 5\' 6"  (1.676 m)  Wt 225 lb 12.8 oz (102.422 kg)  BMI 36.44 kg/m2 Well developed and well nourished in no acute distress HENT normal E scleral and icterus clear Neck Supple JVP flat; carotids brisk and full Clear to ausculation Regular rate and rhythm, no  murmurs gallops or rub Soft with active bowel sounds No clubbing cyanosis Trace Edema Alert and oriented, grossly normal motor and sensory function Skin Warm and Dry  Sinus rhythm at 63 Intervals 0.18/0.10/0.44 Assessment and  Plan

## 2011-11-14 NOTE — Assessment & Plan Note (Signed)
Stable on current medications 

## 2011-11-21 ENCOUNTER — Encounter (INDEPENDENT_AMBULATORY_CARE_PROVIDER_SITE_OTHER): Payer: Medicare Other

## 2011-11-21 DIAGNOSIS — M25569 Pain in unspecified knee: Secondary | ICD-10-CM | POA: Diagnosis not present

## 2011-11-26 ENCOUNTER — Ambulatory Visit (INDEPENDENT_AMBULATORY_CARE_PROVIDER_SITE_OTHER): Payer: Medicare Other

## 2011-11-26 DIAGNOSIS — I6350 Cerebral infarction due to unspecified occlusion or stenosis of unspecified cerebral artery: Secondary | ICD-10-CM | POA: Diagnosis not present

## 2011-11-26 DIAGNOSIS — I4891 Unspecified atrial fibrillation: Secondary | ICD-10-CM | POA: Diagnosis not present

## 2011-11-26 DIAGNOSIS — Z7901 Long term (current) use of anticoagulants: Secondary | ICD-10-CM

## 2011-11-26 LAB — POCT INR: INR: 2.8

## 2011-12-16 ENCOUNTER — Encounter: Payer: Self-pay | Admitting: Internal Medicine

## 2011-12-16 ENCOUNTER — Ambulatory Visit (INDEPENDENT_AMBULATORY_CARE_PROVIDER_SITE_OTHER): Payer: Medicare Other | Admitting: Internal Medicine

## 2011-12-16 VITALS — BP 126/80 | HR 61 | Temp 98.0°F | Wt 223.0 lb

## 2011-12-16 DIAGNOSIS — I4891 Unspecified atrial fibrillation: Secondary | ICD-10-CM

## 2011-12-16 DIAGNOSIS — J209 Acute bronchitis, unspecified: Secondary | ICD-10-CM | POA: Diagnosis not present

## 2011-12-16 DIAGNOSIS — R3989 Other symptoms and signs involving the genitourinary system: Secondary | ICD-10-CM

## 2011-12-16 DIAGNOSIS — Z7901 Long term (current) use of anticoagulants: Secondary | ICD-10-CM

## 2011-12-16 DIAGNOSIS — J029 Acute pharyngitis, unspecified: Secondary | ICD-10-CM

## 2011-12-16 DIAGNOSIS — N399 Disorder of urinary system, unspecified: Secondary | ICD-10-CM

## 2011-12-16 LAB — POCT INR: INR: 2.2

## 2011-12-16 LAB — POCT RAPID STREP A (OFFICE): Rapid Strep A Screen: NEGATIVE

## 2011-12-16 MED ORDER — HYDROCODONE-HOMATROPINE 5-1.5 MG/5ML PO SYRP: 5.0000 mL | ORAL_SOLUTION | Freq: Four times a day (QID) | ORAL | Status: AC | PRN

## 2011-12-16 MED ORDER — SULFAMETHOXAZOLE-TRIMETHOPRIM 800-160 MG PO TABS: 1.0000 | ORAL_TABLET | Freq: Two times a day (BID) | ORAL | Status: DC

## 2011-12-16 NOTE — Progress Notes (Signed)
  Subjective:    Patient ID: Kayla Harrison, female    DOB: 1945/10/13, 66 y.o.   MRN: [id number]  HPI Respiratory tract infection Onset/symptoms:12/13/11 as dry cough Exposures (illness/environmental/extrinsic):in flight from [place] 12.5 hrs ; her roommate on the trip did have a respiratory tract infection Progression of symptoms:to green sputum Treatments/response:fluids & rest w/o benefit Present symptoms: Fever/chills/sweats:no Frontal headache:no Facial pain:no Nasal purulence:no Sore throat:yes Dental pain:no Lymphadenopathy:yes Wheezing/shortness of breath:no Cough/sputum/hemoptysis:green- yellow sputum Pleuritic pain:no  No past history of smoking or asthma but RAD with prior RTI. The reactive airways symptoms cleared with antibiotics previously          Review of Systems Her last PT/INR was 2.4 in early March prior to her trip to [place]. She's been having brown urine without dysuria, frank hematuria , pyuria or flank pain. Urine cleared by 3/22     Objective:   Physical Exam General appearance:good health ;well nourished; no acute distress or increased work of breathing is present.  No  lymphadenopathy about the head, neck, or axilla noted.   Eyes: No conjunctival inflammation or lid edema is present..  Ears:  External ear exam shows no significant lesions or deformities.  Otoscopic examination reveals clear canals, tympanic membranes are intact bilaterally without bulging, retraction, inflammation or discharge.  Nose:  External nasal examination shows no deformity or inflammation. Nasal mucosa are pink and moist without lesions or exudates. No septal dislocation or deviation.No obstruction to airflow. Hyponasal speech  Oral exam: Dental hygiene is good; lips and gums are healthy appearing.There is mild oropharyngeal erythema or exudate noted.      Heart:  Normal rate and regular rhythm. S1 and S2 normal without gallop, murmur, click, rub. S 4 Lungs:mild   Rhonchi w/o   rales ,or rubs present.No increased work of breathing.    Extremities:  No cyanosis, edema, or clubbing  noted    Skin: Warm & dry w/o jaundice or tenting.          Assessment & Plan:  #1 acute bronchitis with mild bronchospasm. Exposure to ill fellow traveler who was sick prior to arrival in [place]. Probable bacterial exposure to native ([place]) bug rather than agent such as atypical viral infection #2 color change of  the urine; rule out hematuria  #3 chronic Coumadin therapy Plan: See orders and recommendations

## 2011-12-16 NOTE — Patient Instructions (Addendum)
Zicam Melts or Zinc lozenges as needed for sore throat. No change in coumadin dose, recheck in 1 week.

## 2011-12-17 ENCOUNTER — Encounter: Payer: Self-pay | Admitting: Internal Medicine

## 2011-12-17 DIAGNOSIS — C73 Malignant neoplasm of thyroid gland: Secondary | ICD-10-CM | POA: Diagnosis not present

## 2011-12-17 DIAGNOSIS — E785 Hyperlipidemia, unspecified: Secondary | ICD-10-CM | POA: Diagnosis not present

## 2011-12-17 DIAGNOSIS — E349 Endocrine disorder, unspecified: Secondary | ICD-10-CM | POA: Diagnosis not present

## 2011-12-17 DIAGNOSIS — I4891 Unspecified atrial fibrillation: Secondary | ICD-10-CM | POA: Diagnosis not present

## 2011-12-19 DIAGNOSIS — B373 Candidiasis of vulva and vagina: Secondary | ICD-10-CM | POA: Diagnosis not present

## 2011-12-19 DIAGNOSIS — R319 Hematuria, unspecified: Secondary | ICD-10-CM | POA: Diagnosis not present

## 2011-12-24 ENCOUNTER — Ambulatory Visit (INDEPENDENT_AMBULATORY_CARE_PROVIDER_SITE_OTHER): Payer: Medicare Other | Admitting: Pharmacist

## 2011-12-24 ENCOUNTER — Other Ambulatory Visit: Payer: Self-pay | Admitting: Internal Medicine

## 2011-12-24 DIAGNOSIS — I4891 Unspecified atrial fibrillation: Secondary | ICD-10-CM

## 2011-12-24 DIAGNOSIS — I6350 Cerebral infarction due to unspecified occlusion or stenosis of unspecified cerebral artery: Secondary | ICD-10-CM

## 2011-12-24 DIAGNOSIS — Z7901 Long term (current) use of anticoagulants: Secondary | ICD-10-CM | POA: Diagnosis not present

## 2011-12-24 LAB — POCT INR: INR: 3

## 2012-01-06 DIAGNOSIS — M25569 Pain in unspecified knee: Secondary | ICD-10-CM | POA: Diagnosis not present

## 2012-01-17 ENCOUNTER — Ambulatory Visit (INDEPENDENT_AMBULATORY_CARE_PROVIDER_SITE_OTHER): Payer: Medicare Other | Admitting: Pharmacist

## 2012-01-17 DIAGNOSIS — Z7901 Long term (current) use of anticoagulants: Secondary | ICD-10-CM

## 2012-01-17 DIAGNOSIS — I4891 Unspecified atrial fibrillation: Secondary | ICD-10-CM | POA: Diagnosis not present

## 2012-01-17 DIAGNOSIS — I6350 Cerebral infarction due to unspecified occlusion or stenosis of unspecified cerebral artery: Secondary | ICD-10-CM

## 2012-01-17 LAB — POCT INR: INR: 2.8

## 2012-01-17 MED ORDER — RIVAROXABAN 20 MG PO TABS: 20.0000 mg | ORAL_TABLET | Freq: Every day | ORAL | Status: DC

## 2012-01-20 ENCOUNTER — Telehealth: Payer: Self-pay | Admitting: *Deleted

## 2012-01-20 DIAGNOSIS — R319 Hematuria, unspecified: Secondary | ICD-10-CM | POA: Diagnosis not present

## 2012-01-20 DIAGNOSIS — R040 Epistaxis: Secondary | ICD-10-CM | POA: Diagnosis not present

## 2012-01-20 DIAGNOSIS — Z8673 Personal history of transient ischemic attack (TIA), and cerebral infarction without residual deficits: Secondary | ICD-10-CM | POA: Diagnosis not present

## 2012-01-20 DIAGNOSIS — I4891 Unspecified atrial fibrillation: Secondary | ICD-10-CM | POA: Diagnosis not present

## 2012-01-20 NOTE — Telephone Encounter (Signed)
Dr. Mayford Knife called and spoke with Dr. Graciela Husbands this morning. Per Dr. Mayford Knife report to Dr. Graciela Husbands, the patient has had episodes of epistaxis and hematuria on Xarelto. Per Dr. Odessa Fleming recommendations, the patient will discontinue Xarelto and start Warfarin.

## 2012-01-21 ENCOUNTER — Telehealth: Payer: Self-pay | Admitting: Internal Medicine

## 2012-01-21 NOTE — Telephone Encounter (Signed)
The patient is aware this has been done.

## 2012-01-21 NOTE — Telephone Encounter (Signed)
Pt seeing an ENT, Dr Haroldine Laws tomorrow @1p  to have a  nasal rocket removed , Dr. Graciela Husbands referred her, and they need info faxed to  [phone] att : Joyce Gross, list of meds and why she was being treated coumadin and xarelto, and copy of last office note, pls call when done [phone]

## 2012-01-21 NOTE — Telephone Encounter (Signed)
I have faxed Dr. Odessa Fleming office note from 11/14/11 to attn: Joyce Gross at Dr. Allayne Stack office at [phone].

## 2012-01-22 ENCOUNTER — Other Ambulatory Visit: Payer: Self-pay | Admitting: Internal Medicine

## 2012-01-22 NOTE — Telephone Encounter (Signed)
Coumadin refill

## 2012-01-23 ENCOUNTER — Telehealth: Payer: Self-pay | Admitting: Internal Medicine

## 2012-01-23 ENCOUNTER — Other Ambulatory Visit: Payer: Self-pay

## 2012-01-23 MED ORDER — WARFARIN SODIUM 5 MG PO TABS: ORAL_TABLET | ORAL | Status: DC

## 2012-01-23 NOTE — Telephone Encounter (Signed)
Pt had several labs done by Dr. Evlyn Kanner a few weeks ago. Pt scheduled for pre-op appt 02-11-12 and will bring lab results. BC

## 2012-01-23 NOTE — Telephone Encounter (Signed)
Message copied by Marshell Garfinkel on Thu Jan 23, 2012  2:43 PM ------      Message from: Pecola Lawless      Created: Sat Jan 18, 2012  6:49 AM       Please  schedule fasting Labs : BMET,Lipids, hepatic panel, CBC & dif, TSH unless completed by Dr Evlyn Kanner or @ [facility] within past 4 months. Dx: E45.40 . Appt with these results from other source or here for medical pre op clearance please.

## 2012-01-24 ENCOUNTER — Telehealth: Payer: Self-pay

## 2012-01-24 ENCOUNTER — Ambulatory Visit (INDEPENDENT_AMBULATORY_CARE_PROVIDER_SITE_OTHER): Payer: Medicare Other | Admitting: Pharmacist

## 2012-01-24 DIAGNOSIS — I4891 Unspecified atrial fibrillation: Secondary | ICD-10-CM | POA: Diagnosis not present

## 2012-01-24 DIAGNOSIS — Z7901 Long term (current) use of anticoagulants: Secondary | ICD-10-CM

## 2012-01-24 DIAGNOSIS — I6350 Cerebral infarction due to unspecified occlusion or stenosis of unspecified cerebral artery: Secondary | ICD-10-CM | POA: Diagnosis not present

## 2012-01-24 MED ORDER — AMIODARONE HCL 200 MG PO TABS: 200.0000 mg | ORAL_TABLET | Freq: Every day | ORAL | Status: DC

## 2012-02-06 ENCOUNTER — Ambulatory Visit (INDEPENDENT_AMBULATORY_CARE_PROVIDER_SITE_OTHER): Payer: Medicare Other

## 2012-02-06 DIAGNOSIS — I4891 Unspecified atrial fibrillation: Secondary | ICD-10-CM | POA: Diagnosis not present

## 2012-02-06 DIAGNOSIS — Z7901 Long term (current) use of anticoagulants: Secondary | ICD-10-CM | POA: Diagnosis not present

## 2012-02-06 DIAGNOSIS — I6350 Cerebral infarction due to unspecified occlusion or stenosis of unspecified cerebral artery: Secondary | ICD-10-CM

## 2012-02-06 LAB — POCT INR: INR: 2.3

## 2012-02-11 ENCOUNTER — Ambulatory Visit (INDEPENDENT_AMBULATORY_CARE_PROVIDER_SITE_OTHER): Payer: Medicare Other | Admitting: Internal Medicine

## 2012-02-11 ENCOUNTER — Encounter: Payer: Self-pay | Admitting: Internal Medicine

## 2012-02-11 VITALS — BP 138/84 | HR 61 | Temp 98.0°F | Resp 12 | Ht 65.08 in | Wt 233.8 lb

## 2012-02-11 DIAGNOSIS — I4891 Unspecified atrial fibrillation: Secondary | ICD-10-CM

## 2012-02-11 DIAGNOSIS — R7401 Elevation of levels of liver transaminase levels: Secondary | ICD-10-CM

## 2012-02-11 DIAGNOSIS — E782 Mixed hyperlipidemia: Secondary | ICD-10-CM | POA: Diagnosis not present

## 2012-02-11 DIAGNOSIS — R7309 Other abnormal glucose: Secondary | ICD-10-CM | POA: Diagnosis not present

## 2012-02-11 DIAGNOSIS — Z85850 Personal history of malignant neoplasm of thyroid: Secondary | ICD-10-CM | POA: Diagnosis not present

## 2012-02-11 DIAGNOSIS — IMO0002 Reserved for concepts with insufficient information to code with codable children: Secondary | ICD-10-CM

## 2012-02-11 DIAGNOSIS — T887XXA Unspecified adverse effect of drug or medicament, initial encounter: Secondary | ICD-10-CM | POA: Insufficient documentation

## 2012-02-11 DIAGNOSIS — M1710 Unilateral primary osteoarthritis, unspecified knee: Secondary | ICD-10-CM | POA: Diagnosis not present

## 2012-02-11 DIAGNOSIS — Z7901 Long term (current) use of anticoagulants: Secondary | ICD-10-CM

## 2012-02-11 IMAGING — RF DG UGI W/ GASTROGRAFIN
12 of 13 series · 19 of 24 positions shown · IV contrast (agent unspecified)
Comparison: 01/16/2010

CLINICAL DATA: Postoperative gastric banding/hiatal hernia repair.

WATER SOLUBLE UPPER GI SERIES
TECHNIQUE: Single-column upper GI series was performed using water
soluble contrast.
Fluoroscopy Time: 1.0 minute
Contrast: 20 ml oral 7mnipaque-CPP

[Series 1: run · 1 of 2 slices shown (1 of 12)]
[im 1/2]
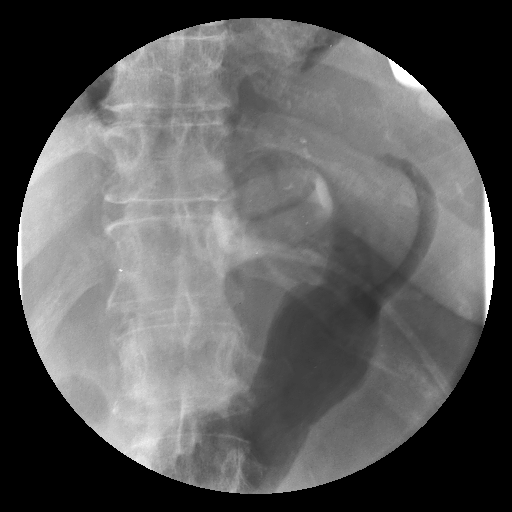

[Series 2: run · 2 of 4 slices shown (2 of 12)]
[im 1/4]
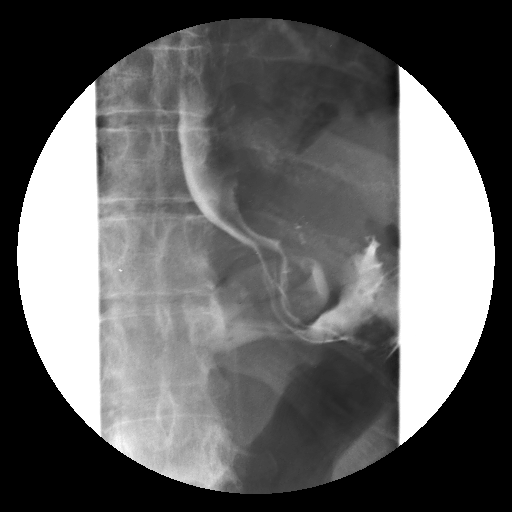
[im 4/4]
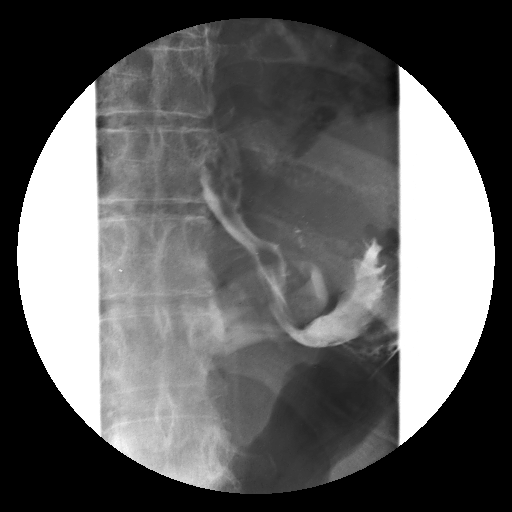

[Series 3: run · 1 of 1 slices shown (3 of 12)]
[im 1/1]
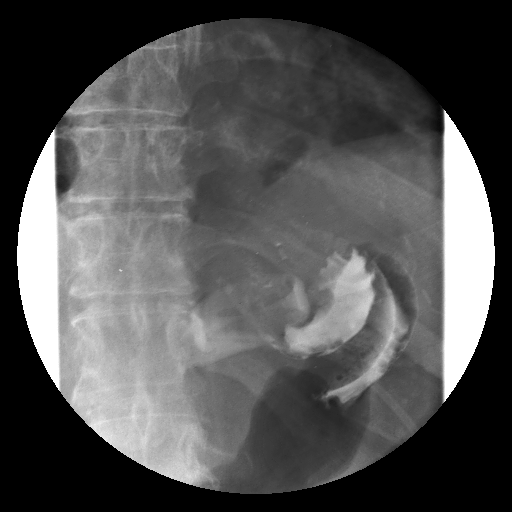

[Series 4: run · 6 of 9 slices shown (4 of 12)]
[im 1/9]
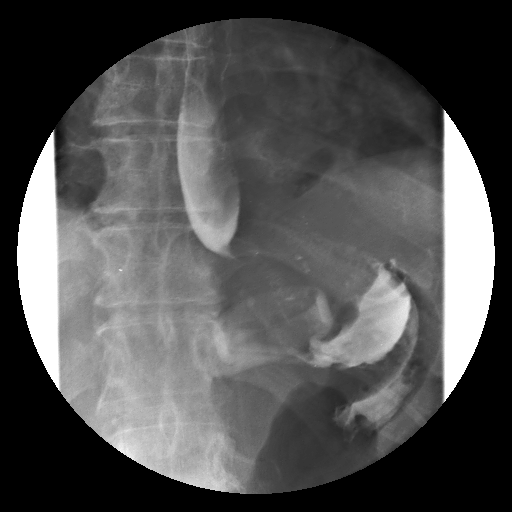
[im 2/9]
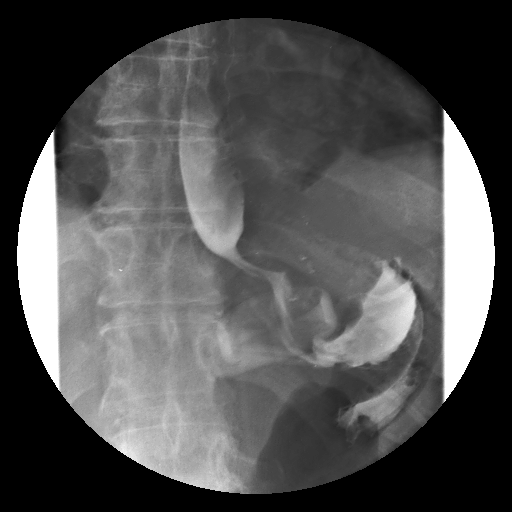
[im 4/9]
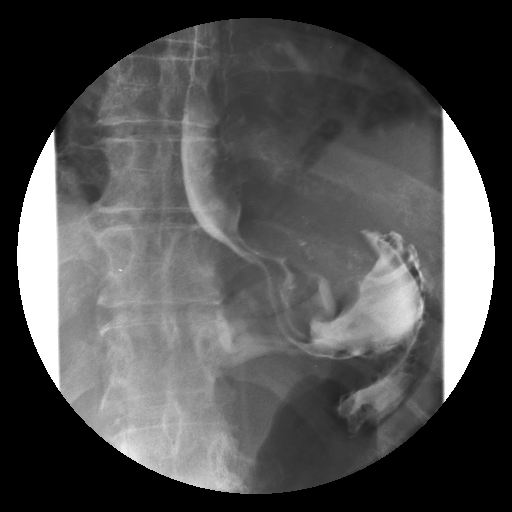
[im 5/9]
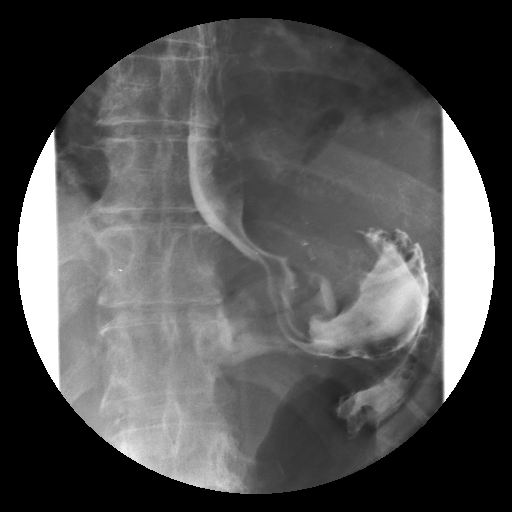
[im 6/9]
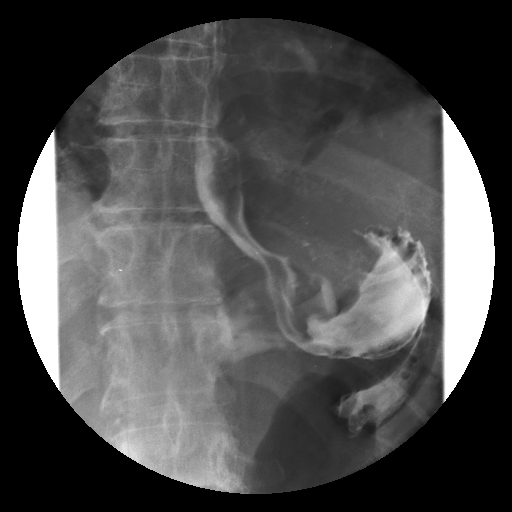
[im 9/9]
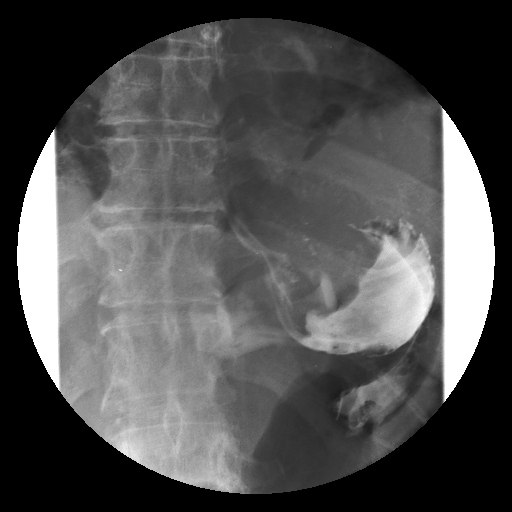

[Series 5: run · 1 of 1 slices shown (5 of 12)]
[im 1/1]
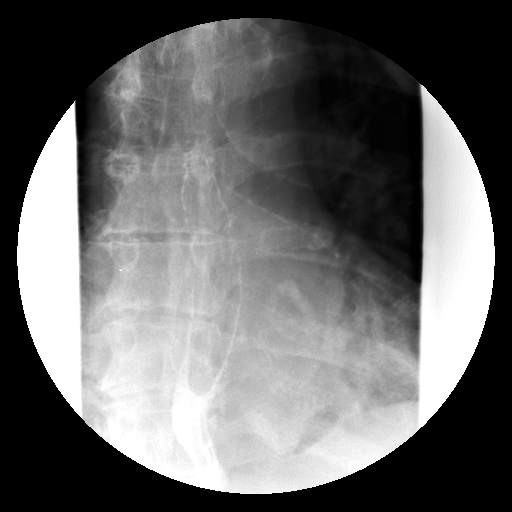

[Series 6: run · 1 of 1 slices shown (6 of 12)]
[im 1/1]
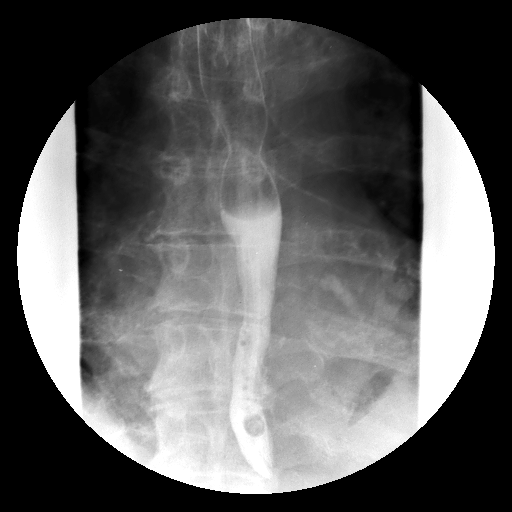

[Series 7: run · 1 of 2 slices shown (7 of 12)]
[im 1/2]
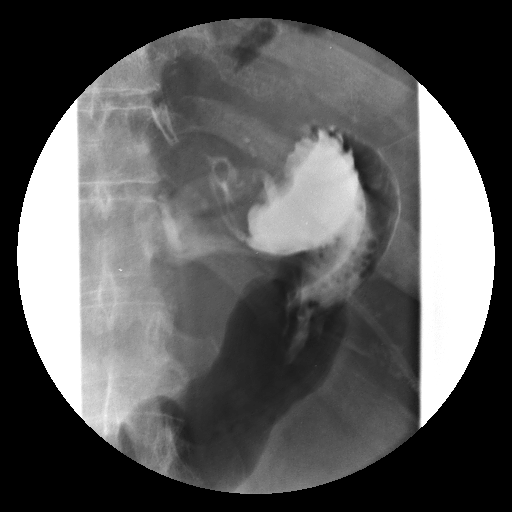

[Series 8: run · 2 of 2 slices shown (8 of 12)]
[im 1/2]
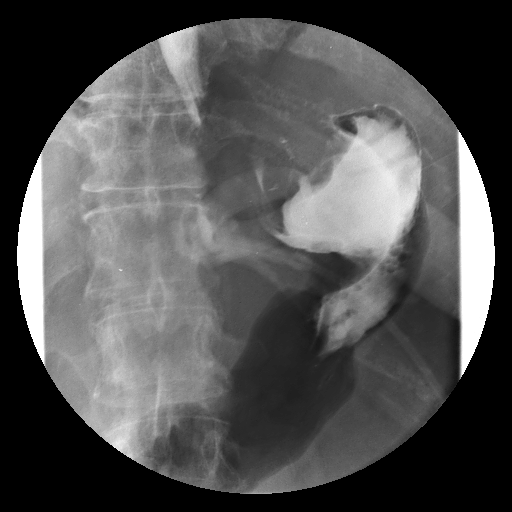
[im 2/2]
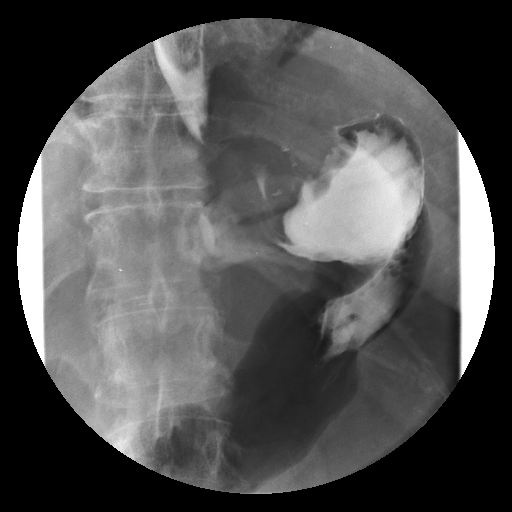

[Series 9: run · 1 of 1 slices shown (9 of 12)]
[im 1/1]
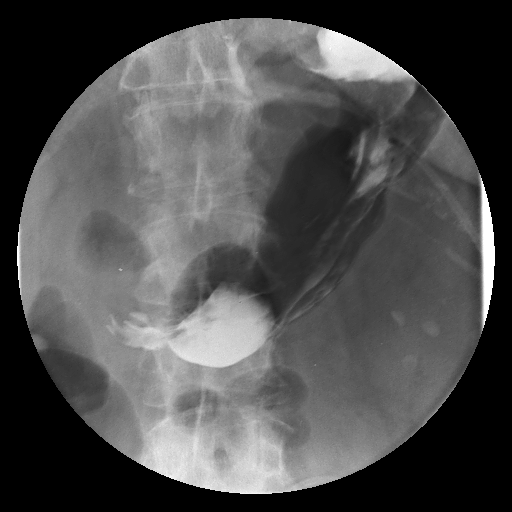

[Series 10: run · 1 of 1 slices shown (10 of 12)]
[im 1/1]
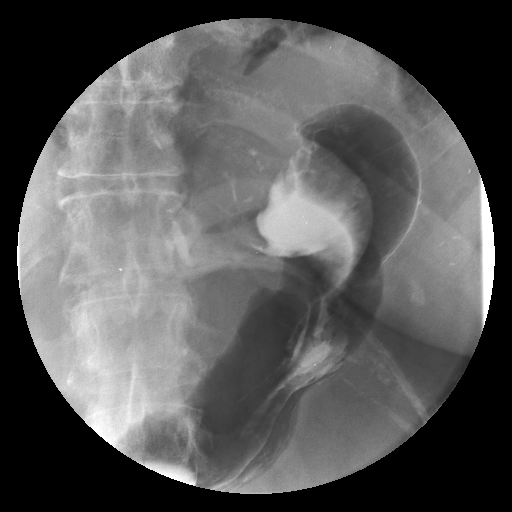

[Series 12: run · 1 of 1 slices shown (11 of 12)]
[im 1/1]
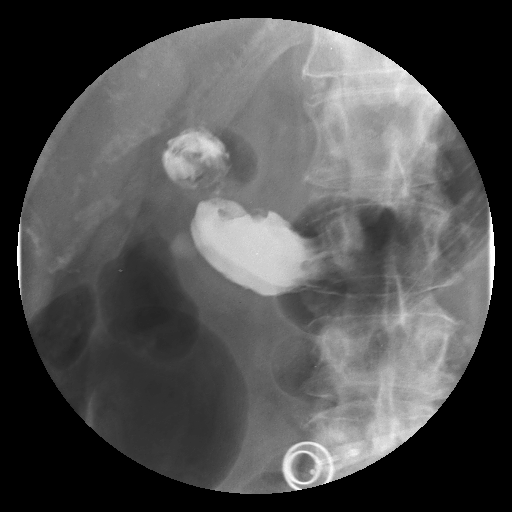

[Series 13: run · 1 of 1 slices shown (12 of 12)]
[im 1/1]
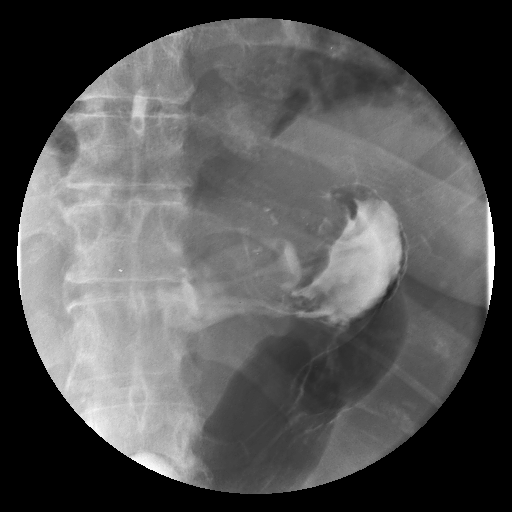

[19 of 24 positions shown; findings below may reference images not displayed]

FINDINGS: Scout film of the abdomen demonstrate a gastric band in
satisfactory orientation in the two to eight o'clock position.
The bowel gas pattern is unremarkable.

After administration of oral contrast, the gastric band and
evidence of hiatal hernia identified.
Contrast flows through the band without obstruction.
There is no evidence of leak.
No complicating features are noted.
Contrast flows into the proximal duodenum.
IMPRESSION: Gastric band and hiatal hernia repair without complicating
features.

## 2012-02-11 NOTE — Patient Instructions (Signed)
.  Share results with Dr Tobin Chad staff; you are medically cleared for surgery the Coumadin clinic will transition you to Lovenox perioperatively from warfarin

## 2012-02-11 NOTE — Assessment & Plan Note (Signed)
Degenerative changes are advanced and symptomatic and impacting quality of life. Medically no contraindication to planned surgery.

## 2012-02-11 NOTE — Progress Notes (Signed)
  Subjective:    Patient ID: Kayla Harrison, female    DOB: Jul 13, 1946, 66 y.o.   MRN: [id number]  HPI Pre op evaluation: Surgical diagnosis: R TKR Tentative surgical date/Surgeon: tentatively  04/13/12; Dr Dannielle Huh Severity:Pain level up to 9 or 10 scale  Pain:  pain is variable, aching to sharp. It is worse with ambulation. She has pain even sitting requiring positioning of the leg  Activity of daily living limitation/impairment of function: she is unable to perform her exercise program because of the pain and unable to walk any significant distance  Treatment to date, efficacy: steroid injections have provided only temporary relief. Celebrex initially was of benefit; it is not helping at this time  Past medical history/family history/social history were all reviewed and updated. See pertinent data in problem list with updated assessment    Review of Systems Other than the degenerative disease of the knee; she denies active issues. She occasionally has hematuria after exercise. She denies pyuria, flank pain or dysuria. Her most recent creatinine was 1.4 and BUN 25 on  12/17/11.     Objective:   Physical Exam Gen.: Healthy and well-nourished in appearance. Alert, appropriate and cooperative throughout exam. Eyes: No corneal or conjunctival inflammation noted. No icterus. Extraocular motion intact. Nose: External nasal exam reveals no deformity or inflammation. Nasal mucosa are pink and moist. No lesions or exudates noted. Septum  normal  Mouth: Oral mucosa and oropharynx reveal no lesions or exudates. Teeth in good repair. Neck: No deformities, masses, or tenderness noted. Range of motion normal. Thyroid absent. Lungs: Normal respiratory effort; chest expands symmetrically. Lungs are clear to auscultation without rales, wheezes, or increased work of breathing. Heart: Normal rate and rhythm. Normal S1 and S2. No gallop, click, or rub. Grade 1/6 systolic murmur . Abdomen: Bowel sounds  normal; abdomen soft and nontender. No masses, organomegaly or hernias noted. Musculoskeletal/extremities: Fusiform changes of the knees with marked crepitus and decreased range of motion. Subjective pain with flexion of the right knee. Tone & strength  normal. Nail health  good. Vascular: Carotid, radial artery, dorsalis pedis and  posterior tibial pulses are full and equal. No bruits present. Neurologic: Alert and oriented x3. Deep tendon reflexes symmetrical and normal.         Skin: Intact without suspicious lesions or rashes. Lymph: No cervical, axillary  lymphadenopathy present. Psych: Mood and affect are normal. Normally interactive                                                                                         Assessment & Plan:

## 2012-02-11 NOTE — Assessment & Plan Note (Signed)
12/17/11: Thyroglobulin less than 0.5, TSH 0.18, free T4 1.9

## 2012-02-11 NOTE — Assessment & Plan Note (Signed)
A1c 12/17/11:5.4%

## 2012-02-11 NOTE — Assessment & Plan Note (Signed)
Lipids 12/17/11 by Dr. Evlyn Kanner: Triglycerides 123, HDL 62, LDL 112.

## 2012-02-11 NOTE — Assessment & Plan Note (Signed)
PT/INR is followed by the Coumadin clinic. She will be converted to Lovenox perioperatively.

## 2012-02-11 NOTE — Assessment & Plan Note (Signed)
12/17/11: AST 17; ALT 19

## 2012-02-18 IMAGING — MG MM DIGITAL DIAGNOSTIC BILAT
5 series · 5 of 5 positions shown · non-contrast
Comparison: 07/07/2009 and 05/12/2008

CLINICAL DATA: Malignant lumpectomy of the left breast in 5272.
Annual reevaluation.

DIGITAL DIAGNOSTIC BILATERAL MAMMOGRAM WITH CAD

[R CC]
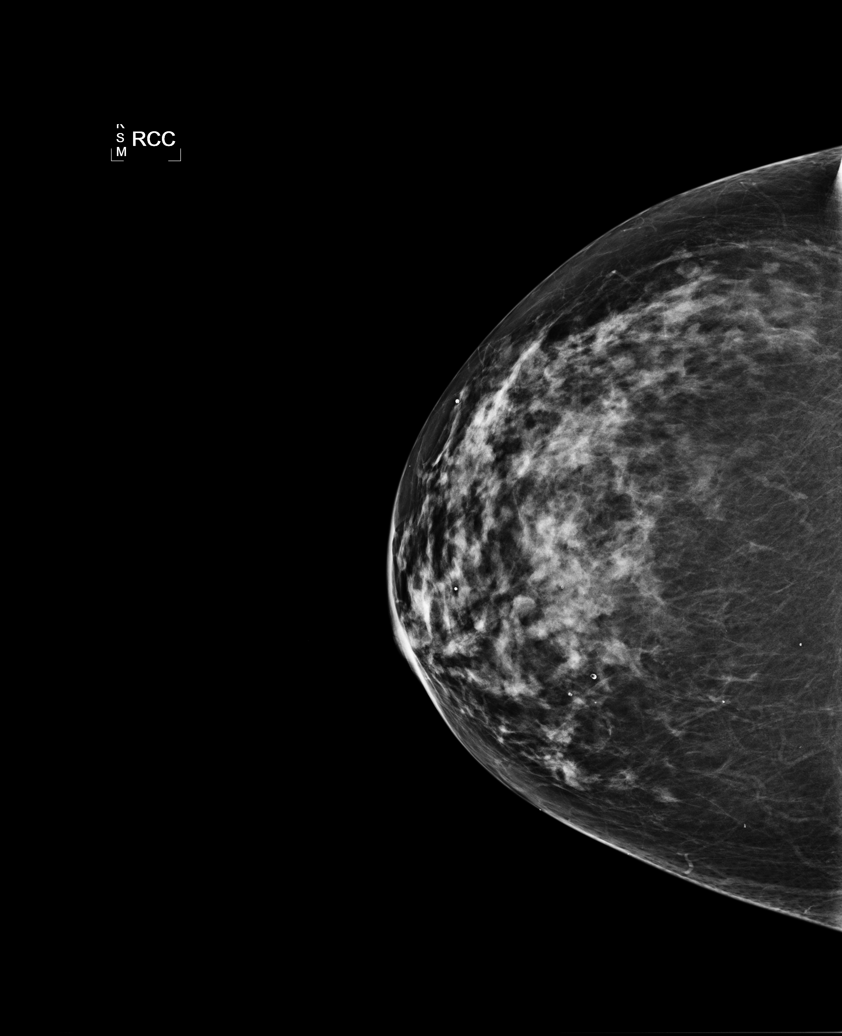

[L CC]
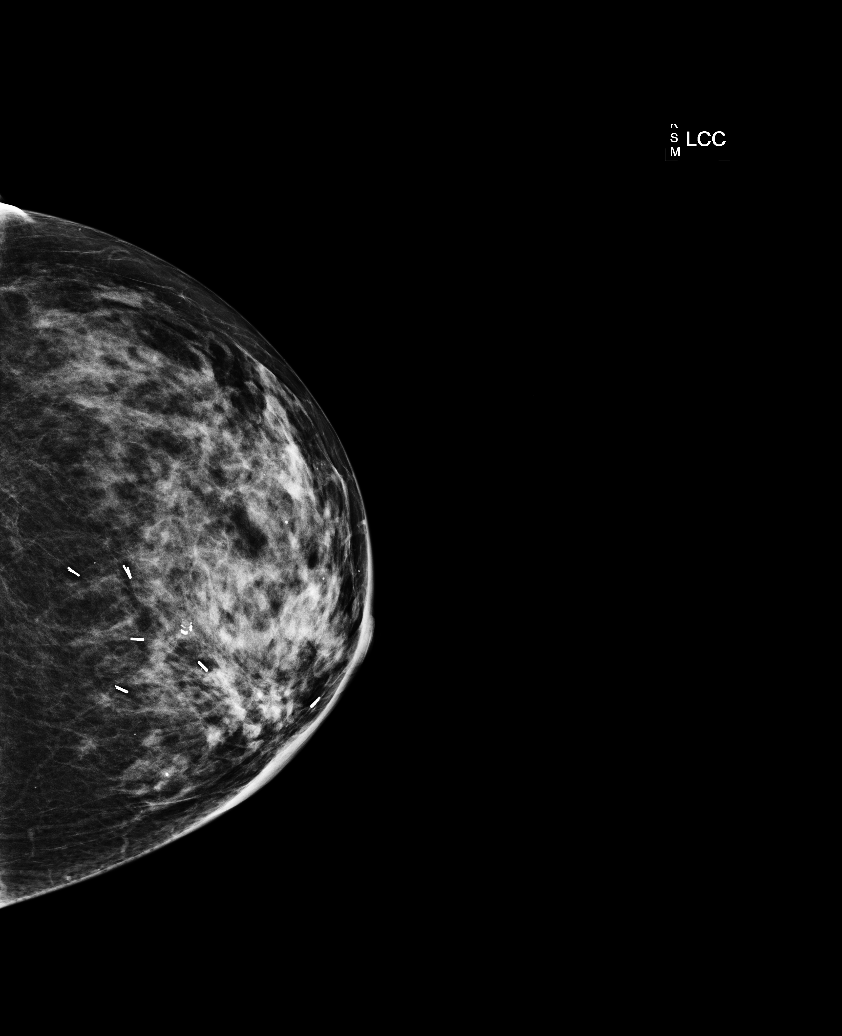

[L MLO]
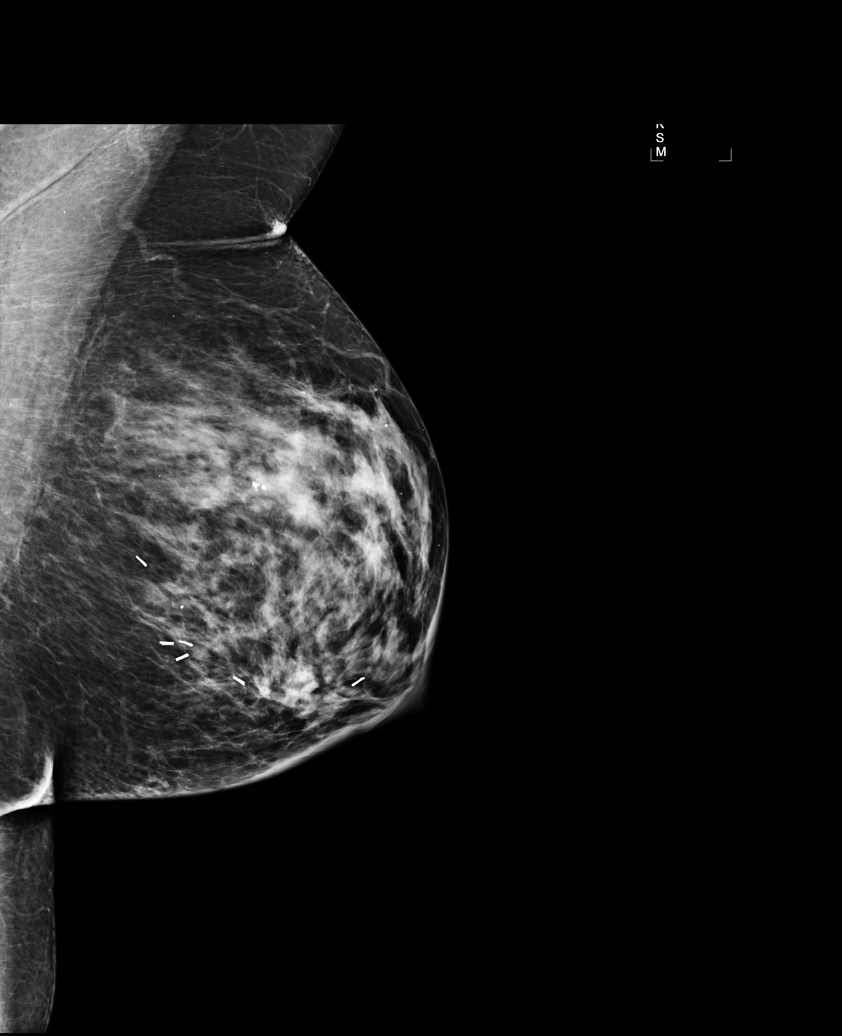

[R MLO]
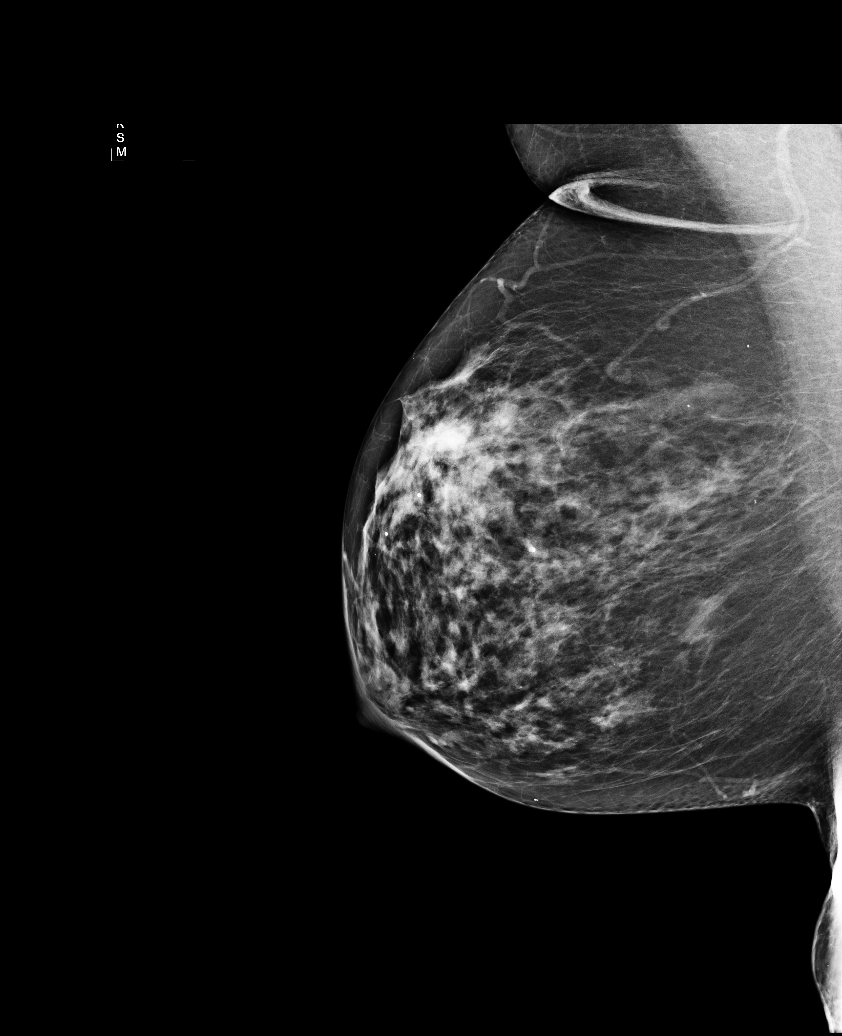

[L TAN]
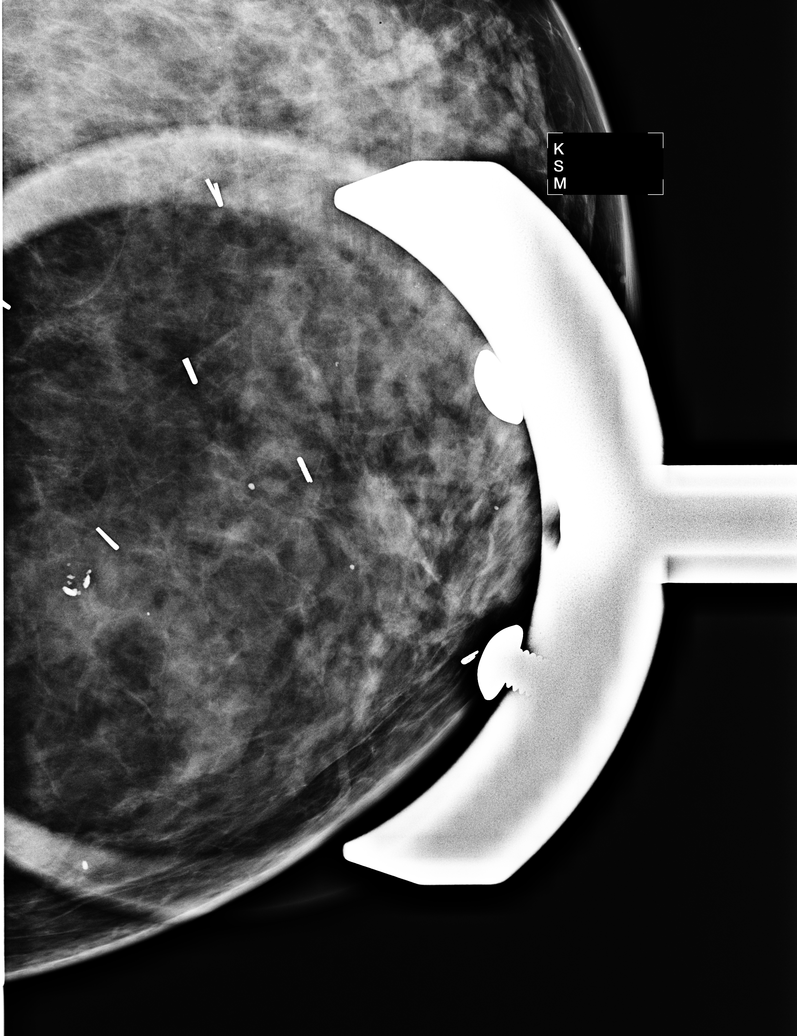

[5 of 5 positions shown; findings below may reference images not displayed]

FINDINGS: There is a heterogeneously dense breast parenchymal
pattern present.  There are mild scarring changes located medially
within the left breast in the area of the patient's lumpectomy.
There is no specific evidence for recurrent tumor or developing
malignancy within either breast.
Mammographic images were processed with CAD.
IMPRESSION: No findings worrisome for developing malignancy.  Recommend annual
diagnostic mammography.

BI-RADS CATEGORY 1:  Negative.

## 2012-02-27 IMAGING — CT CT ANGIO CHEST
2 of 6 series · 19 of 36 positions shown · IV contrast (Omnipaque 300)
Comparison: Plain films of the chest 06/18/2010.  C t a chest
09/13/2007 and 10/02/2008.

CLINICAL DATA: Back pain.  History of breast cancer.  Shortness of
breath.

CT ANGIOGRAPHY CHEST WITH CONTRAST
TECHNIQUE: Multidetector CT imaging of the chest was performed
using the standard protocol during bolus administration of
intravenous contrast.  Multiplanar CT image reconstructions
including MIPs were obtained to evaluate the vascular anatomy.
Contrast:  80 ml 7mnipaque-8YY.

[Series 5: thins (id) / (id) · axial · 0.64mm/px · z∈[-213,+1]mm · 18 of 238 slices shown]
[im 12/238  lung]
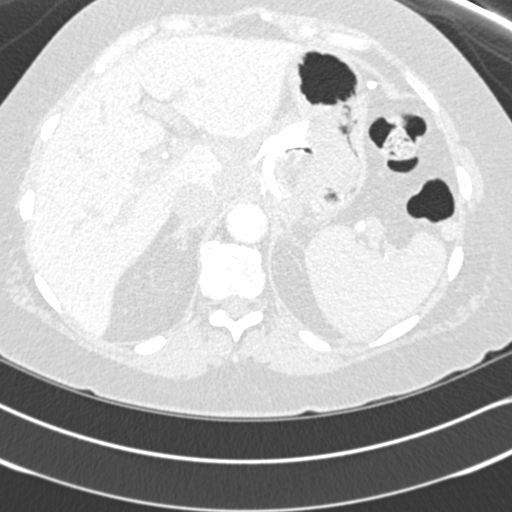
[im 24/238  mediastinal]
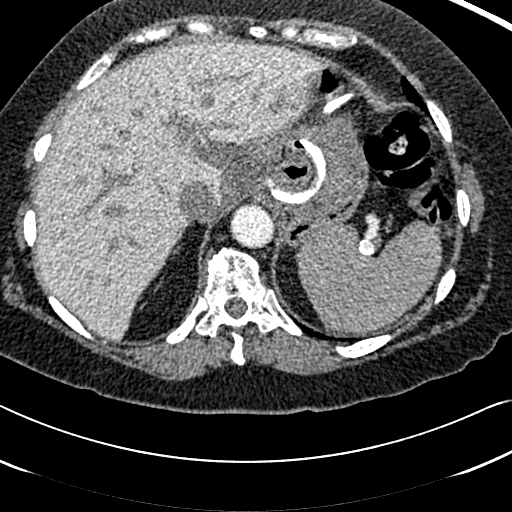
[im 36/238  lung]
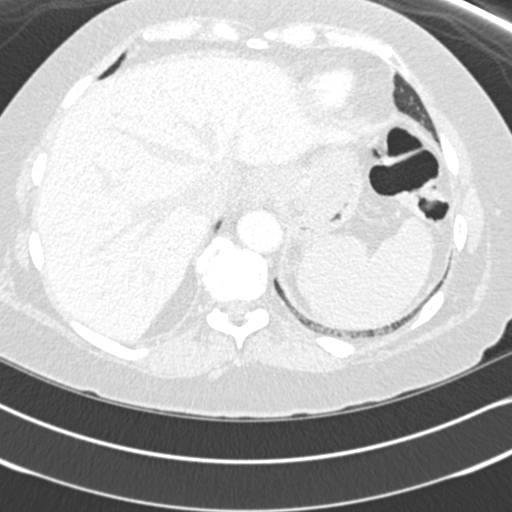
[im 48/238  mediastinal]
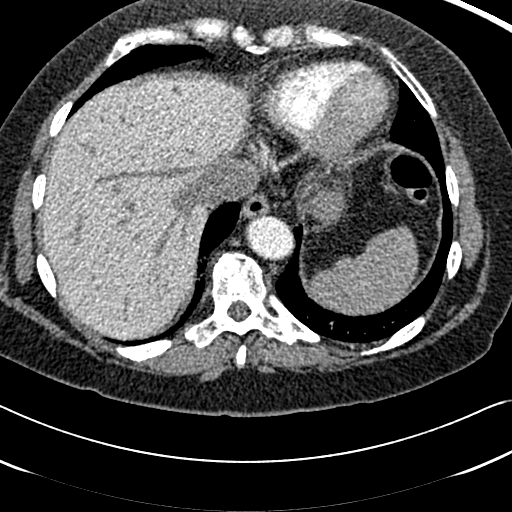
[im 60/238  lung]
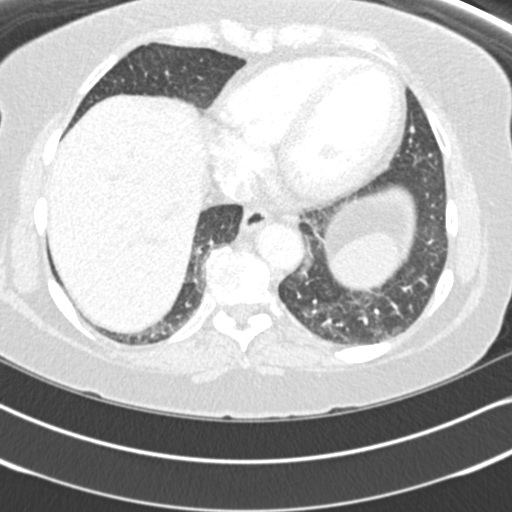
[im 72/238  mediastinal]
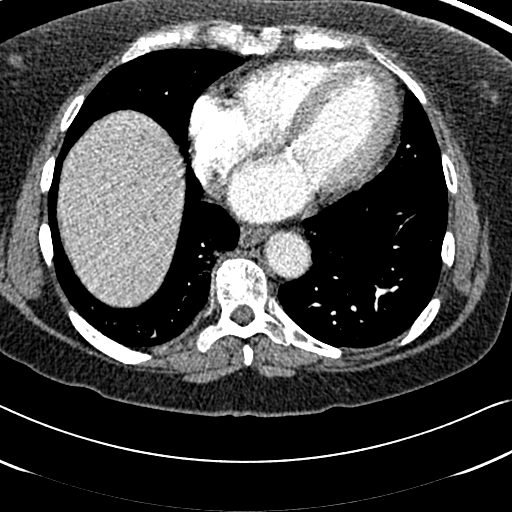
[im 83/238  lung]
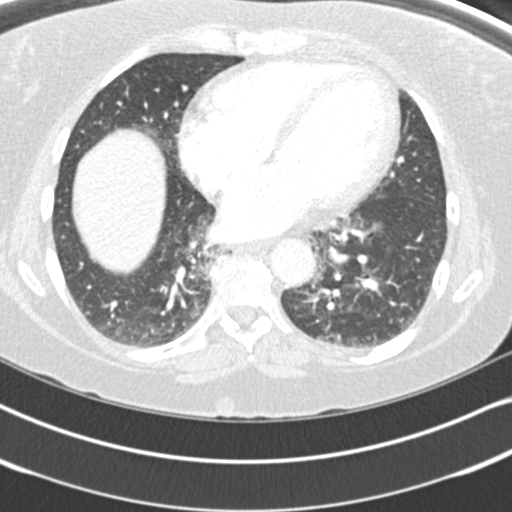
[im 95/238  mediastinal]
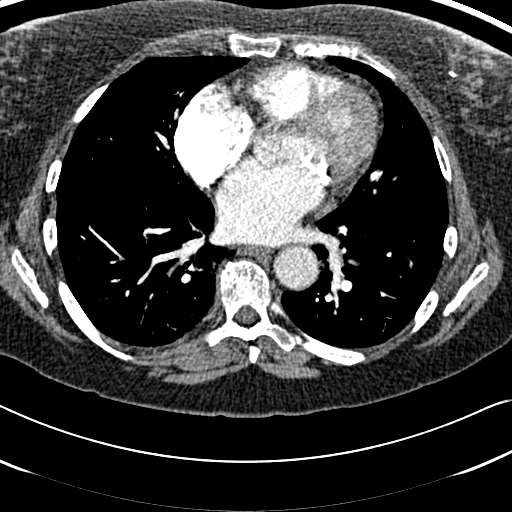
[im 107/238  lung]
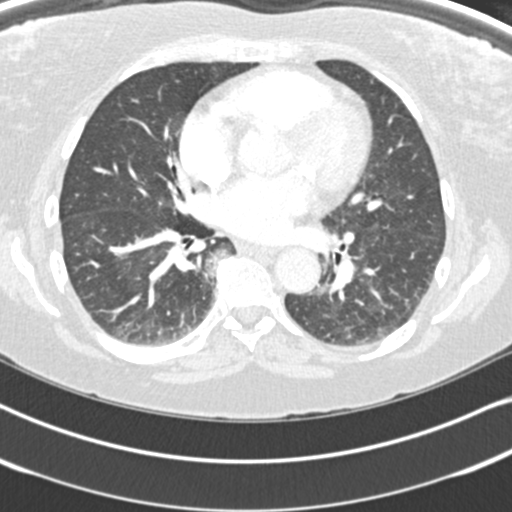
[im 131/238  mediastinal]
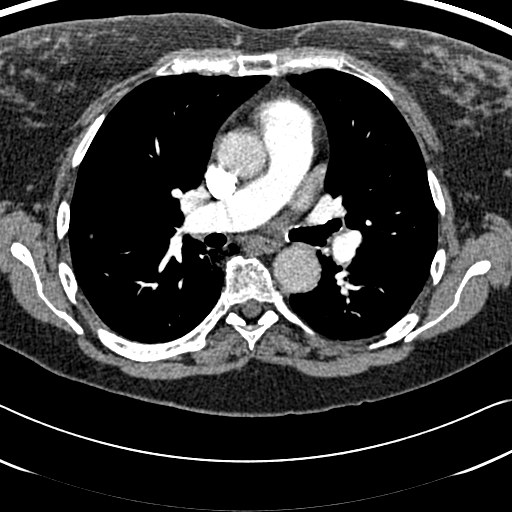
[im 143/238  lung]
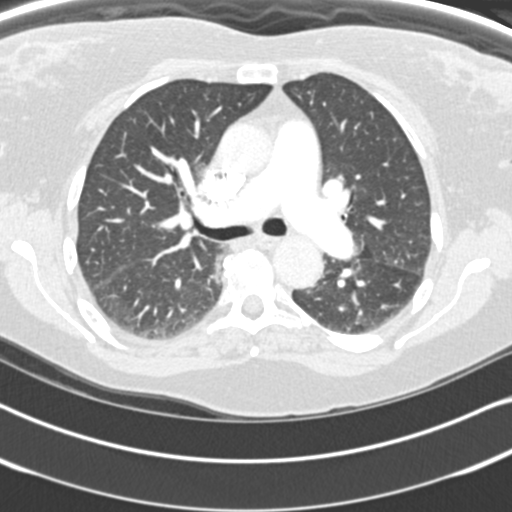
[im 155/238  mediastinal]
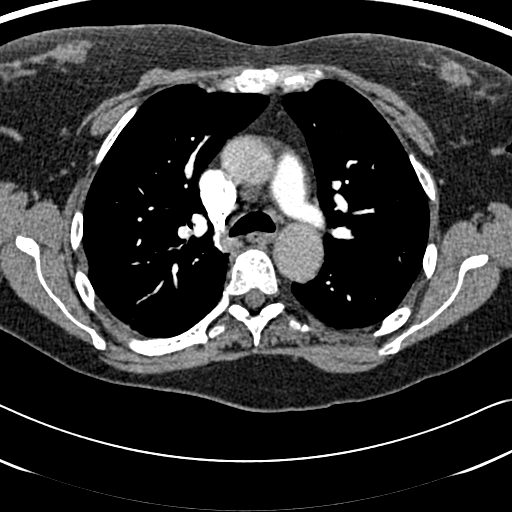
[im 166/238  lung]
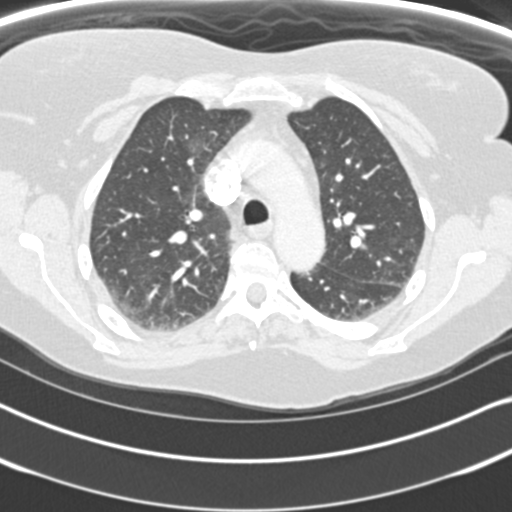
[im 178/238  mediastinal]
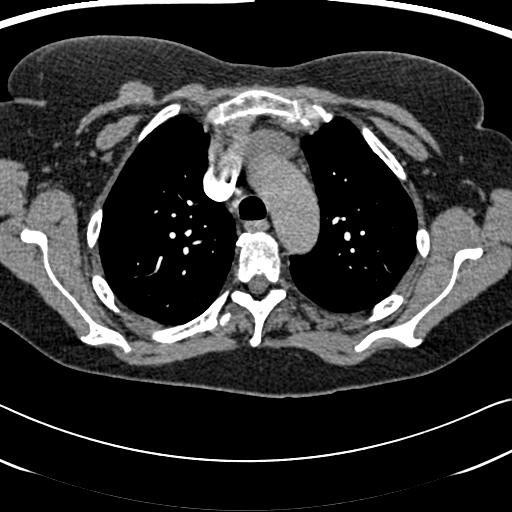
[im 190/238  lung]
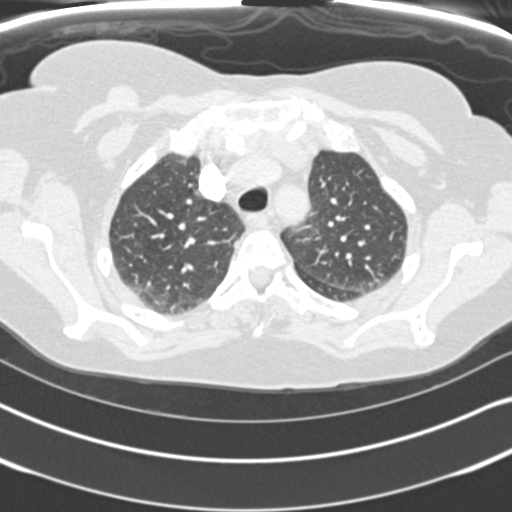
[im 202/238  mediastinal]
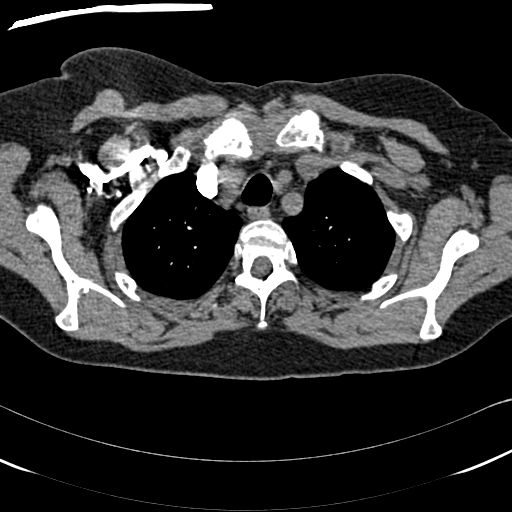
[im 214/238  lung]
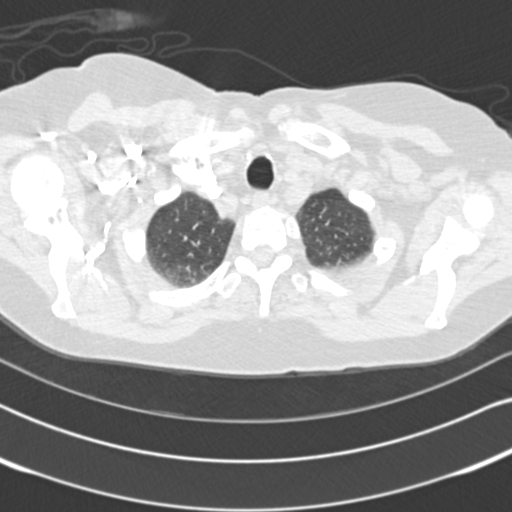
[im 226/238  mediastinal]
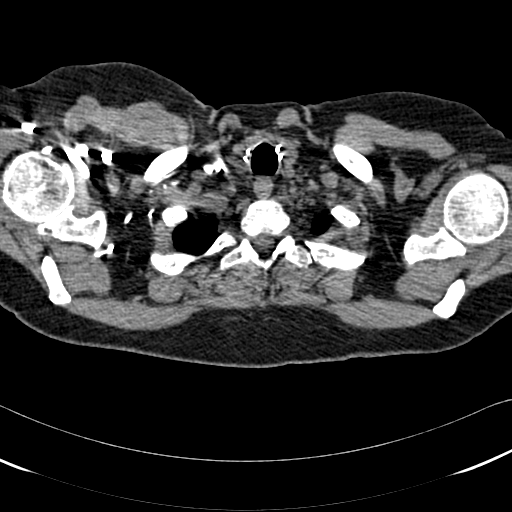

[Series 602: <mpr thick range> · coronal · 0.64mm/px · 1 of 111 slices shown]
[im 56/111  mediastinal]
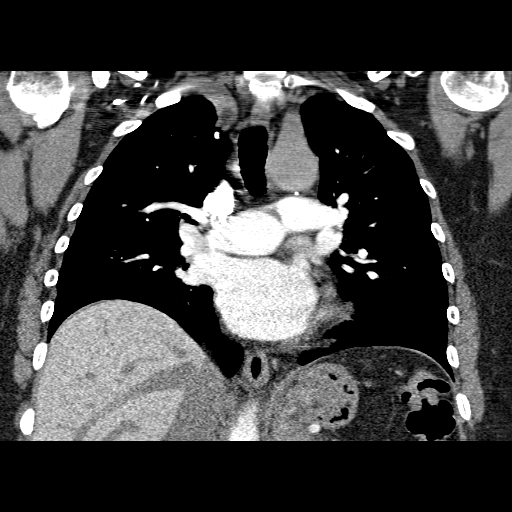

[19 of 36 positions shown; findings below may reference images not displayed]

FINDINGS: No pulmonary embolus is identified.  There is no
axillary, hilar or mediastinal lymphadenopathy.  No pleural or
pericardial effusion.  Heart size is upper normal.  Lungs
demonstrate only some mild dependent atelectasis.  Incidentally
imaged upper abdomen demonstrates postoperative change of gastric
banding.  There is infiltration of fat planes about the device with
note made that the patient had surgery 07/10/2010.  No focal bony
abnormality.

Review of the MIP images confirms the above findings.
IMPRESSION: 1.  Negative for pulmonary embolus or acute cardiopulmonary
disease.
2.   Status post recent gastric banding.  Infiltration of fat
planes in the upper abdomen is consistent with recent surgery.

## 2012-03-05 ENCOUNTER — Ambulatory Visit (INDEPENDENT_AMBULATORY_CARE_PROVIDER_SITE_OTHER): Payer: Medicare Other | Admitting: Pharmacist

## 2012-03-05 DIAGNOSIS — Z7901 Long term (current) use of anticoagulants: Secondary | ICD-10-CM | POA: Diagnosis not present

## 2012-03-05 DIAGNOSIS — I4891 Unspecified atrial fibrillation: Secondary | ICD-10-CM

## 2012-03-05 DIAGNOSIS — I6350 Cerebral infarction due to unspecified occlusion or stenosis of unspecified cerebral artery: Secondary | ICD-10-CM | POA: Diagnosis not present

## 2012-03-05 DIAGNOSIS — M25559 Pain in unspecified hip: Secondary | ICD-10-CM | POA: Diagnosis not present

## 2012-03-05 LAB — POCT INR: INR: 2.2

## 2012-03-05 MED ORDER — ENOXAPARIN SODIUM 100 MG/ML ~~LOC~~ SOLN: 100.0000 mg | Freq: Two times a day (BID) | SUBCUTANEOUS | Status: DC

## 2012-03-05 NOTE — Patient Instructions (Signed)
7/16 - Last dose of coumadin (PM)  7/17 - No coumadin or Lovenox  7/18 - First dose of Lovenox (100mg  SQ q12h)  7/21 - Last dose of Lovenox (AM)  7/22 - Procedure   Restart coumadin and lovenox after surgery as instructed.   Recheck INR in clinic on Friday, 7/26

## 2012-03-05 NOTE — Progress Notes (Signed)
Ht: 5'6" Wt: 220 lb, SCr 1.02 (06/2011), CrCL ~50 mL/min  7/16 - Last dose of coumadin (PM) 7/17 - No coumadin or Lovenox 7/18 - First dose of Lovenox (100mg  SQ q12h) 7/21 - Last dose of Lovenox (AM) 7/22 - Day of surgery  Restart coumadin and lovenox after surgery as instructed by physician.  Recheck INR in clinic on Friday, 7/26  Plan approved by Dr. Alwyn Ren & Dr. Graciela Husbands

## 2012-03-19 ENCOUNTER — Other Ambulatory Visit: Payer: Self-pay | Admitting: Orthopedic Surgery

## 2012-03-19 DIAGNOSIS — M25569 Pain in unspecified knee: Secondary | ICD-10-CM | POA: Diagnosis not present

## 2012-03-27 ENCOUNTER — Encounter (HOSPITAL_COMMUNITY): Payer: Self-pay

## 2012-04-09 ENCOUNTER — Other Ambulatory Visit (HOSPITAL_COMMUNITY): Payer: Medicare Other

## 2012-04-10 ENCOUNTER — Encounter (HOSPITAL_COMMUNITY)
Admission: RE | Admit: 2012-04-10 | Discharge: 2012-04-10 | Disposition: A | Discharge status: Home / Self Care | Payer: Medicare Other | Source: Ambulatory Visit | Attending: Orthopedic Surgery | Admitting: Orthopedic Surgery

## 2012-04-10 ENCOUNTER — Encounter (HOSPITAL_COMMUNITY): Payer: Self-pay | Admitting: Vascular Surgery

## 2012-04-10 ENCOUNTER — Encounter (HOSPITAL_COMMUNITY): Payer: Self-pay

## 2012-04-10 DIAGNOSIS — Z01811 Encounter for preprocedural respiratory examination: Secondary | ICD-10-CM | POA: Diagnosis not present

## 2012-04-10 HISTORY — DX: Inflammatory liver disease, unspecified: K75.9

## 2012-04-10 HISTORY — DX: Hypothyroidism, unspecified: E03.9

## 2012-04-10 HISTORY — DX: Other complications of anesthesia, initial encounter: T88.59XA

## 2012-04-10 HISTORY — DX: Unspecified osteoarthritis, unspecified site: M19.90

## 2012-04-10 HISTORY — DX: Adverse effect of unspecified anesthetic, initial encounter: T41.45XA

## 2012-04-10 LAB — DIFFERENTIAL
Basophils Absolute: 0.1 10*3/uL (ref 0.0–0.1)
Basophils Relative: 1 % (ref 0–1)
Eosinophils Relative: 4 % (ref 0–5)
Lymphocytes Relative: 25 % (ref 12–46)
Monocytes Absolute: 0.5 10*3/uL (ref 0.1–1.0)

## 2012-04-10 LAB — URINALYSIS, ROUTINE W REFLEX MICROSCOPIC
Glucose, UA: NEGATIVE mg/dL
Protein, ur: 30 mg/dL — AB

## 2012-04-10 LAB — CBC
Platelets: 189 10*3/uL (ref 150–400)
RBC: 4.37 MIL/uL (ref 3.87–5.11)
WBC: 5.6 10*3/uL (ref 4.0–10.5)

## 2012-04-10 LAB — URINE MICROSCOPIC-ADD ON

## 2012-04-10 LAB — TYPE AND SCREEN: Antibody Screen: NEGATIVE

## 2012-04-10 LAB — COMPREHENSIVE METABOLIC PANEL
ALT: 15 U/L (ref 0–35)
AST: 17 U/L (ref 0–37)
CO2: 28 mEq/L (ref 19–32)
Chloride: 104 mEq/L (ref 96–112)
GFR calc non Af Amer: 56 mL/min — ABNORMAL LOW (ref >90)
Potassium: 4.2 mEq/L (ref 3.5–5.1)
Sodium: 142 mEq/L (ref 135–145)
Total Bilirubin: 0.5 mg/dL (ref 0.3–1.2)

## 2012-04-10 NOTE — Progress Notes (Signed)
Call to A. Zelenak,PAC, relative to UA result & clotting studies.  Order will be entered. Call to [facility] at Dr. Tobin Chad office, voicemail only avail. Call back to office, spoke /w Steward Drone   & left msg. for  Myles Rosenthal,  Re: urine result.

## 2012-04-10 NOTE — Consult Note (Signed)
Anesthesia Chart Review:  Patient is a 66 year old female scheduled for a right TKA on 04/13/12.  PAT appointment was Friday (04/10/12) afternoon.   History includes obesity with BMI 38 with history of lap band procedure '11, thyroidectomy ([facility]) with secondary hypothyroidism, breast CA, HLD, afib s/p ablation ([facility]), CVA, PNA, Brucellosis as a teenager, history of elevated transaminases ("nonspecific"), and adverse reaction to ketamine ("bright colored nightmarish experience").  PCP is Dr. Marga Melnick who cleared her for this procedure.    Her Cardiologist is Dr. Graciela Husbands.  He last saw her on 11/14/11.  EKG then showed NSR.  Echo on 07/03/10 showed: - Left ventricle: The cavity size was normal. Systolic function was normal. The estimated ejection fraction was in the range of 55% to 60%. Wall motion was normal; there were no regional wall motion abnormalities. - Mitral valve: Calcified annulus. - Left atrium: The atrium was mildly dilated. - Atrial septum: The septum bowed from left to right, consistent with increased left atrial pressure.  Normal CXR on 04/10/12.    Labs noted.  Repeat PT/PTT on arrival.  UA results called to Dr. Tobin Chad office by the PAT RN.  Urine culture is still pending.  If follow-up labs are reasonable then anticipate she can proceed as planned.  Shonna Chock, PA-C

## 2012-04-10 NOTE — Pre-Procedure Instructions (Signed)
18 Kayla Harrison  04/10/2012   Your procedure is scheduled on:  04/13/2012  Report to [facility] at 5:30 AM.  Call this number if you have problems the morning of surgery: [phone]   Remember:   Do not eat food or drink :After Midnight.      Take these medicines the morning of surgery with A SIP OF WATER: amiodarone, synthroid   Do not wear jewelry, make-up or nail polish.  Do not wear lotions, powders, or perfumes. You may wear deodorant.  Do not shave 48 hours prior to surgery. Men may shave face and neck.  Do not bring valuables to the hospital.  Contacts, dentures or bridgework may not be worn into surgery.  Leave suitcase in the car. After surgery it may be brought to your room.  For patients admitted to the hospital, checkout time is 11:00 AM the day of discharge.   Patients discharged the day of surgery will not be allowed to drive home.  Name and phone number of your driver: /w friend   Special Instructions: CHG Shower Use Special Wash: 1/2 bottle night before surgery and 1/2 bottle morning of surgery.   Please read over the following fact sheets that you were given: Pain Booklet, Coughing and Deep Breathing, Blood Transfusion Information, Total Joint Packet, MRSA Information and Surgical Site Infection Prevention

## 2012-04-12 MED ORDER — CHLORHEXIDINE GLUCONATE 4 % EX LIQD: 60.0000 mL | Freq: Once | CUTANEOUS | Status: DC

## 2012-04-12 MED ORDER — CEFAZOLIN SODIUM-DEXTROSE 2-3 GM-% IV SOLR
2.0000 g | INTRAVENOUS | Status: AC
  Administered 2012-04-13: 2 g via INTRAVENOUS
  Filled 2012-04-12: qty 50

## 2012-04-12 MED ORDER — ACETAMINOPHEN 10 MG/ML IV SOLN
1000.0000 mg | Freq: Four times a day (QID) | INTRAVENOUS | Status: DC
  Filled 2012-04-12 (×4): qty 100

## 2012-04-12 MED ORDER — SODIUM CHLORIDE 0.9 % IV SOLN: INTRAVENOUS | Status: DC

## 2012-04-13 ENCOUNTER — Inpatient Hospital Stay (HOSPITAL_COMMUNITY)
Admission: RE | Admit: 2012-04-13 | Discharge: 2012-04-15 | DRG: 470 | Disposition: A | Discharge status: Home Health | Payer: Medicare Other | Source: Ambulatory Visit | Attending: Orthopedic Surgery | Admitting: Orthopedic Surgery

## 2012-04-13 ENCOUNTER — Encounter (HOSPITAL_COMMUNITY)
Admission: RE | Disposition: A | Discharge status: Home Health | Payer: Self-pay | Source: Ambulatory Visit | Attending: Orthopedic Surgery

## 2012-04-13 ENCOUNTER — Encounter (HOSPITAL_COMMUNITY): Payer: Self-pay | Admitting: Vascular Surgery

## 2012-04-13 ENCOUNTER — Ambulatory Visit (HOSPITAL_COMMUNITY): Payer: Medicare Other | Admitting: Vascular Surgery

## 2012-04-13 DIAGNOSIS — D62 Acute posthemorrhagic anemia: Secondary | ICD-10-CM | POA: Diagnosis not present

## 2012-04-13 DIAGNOSIS — Z01812 Encounter for preprocedural laboratory examination: Secondary | ICD-10-CM | POA: Diagnosis not present

## 2012-04-13 DIAGNOSIS — Z85850 Personal history of malignant neoplasm of thyroid: Secondary | ICD-10-CM | POA: Diagnosis not present

## 2012-04-13 DIAGNOSIS — E785 Hyperlipidemia, unspecified: Secondary | ICD-10-CM | POA: Diagnosis present

## 2012-04-13 DIAGNOSIS — I5032 Chronic diastolic (congestive) heart failure: Secondary | ICD-10-CM | POA: Diagnosis present

## 2012-04-13 DIAGNOSIS — M1711 Unilateral primary osteoarthritis, right knee: Secondary | ICD-10-CM

## 2012-04-13 DIAGNOSIS — I6789 Other cerebrovascular disease: Secondary | ICD-10-CM | POA: Diagnosis not present

## 2012-04-13 DIAGNOSIS — IMO0002 Reserved for concepts with insufficient information to code with codable children: Secondary | ICD-10-CM | POA: Diagnosis not present

## 2012-04-13 DIAGNOSIS — G8918 Other acute postprocedural pain: Secondary | ICD-10-CM | POA: Diagnosis not present

## 2012-04-13 DIAGNOSIS — I4891 Unspecified atrial fibrillation: Secondary | ICD-10-CM | POA: Diagnosis present

## 2012-04-13 DIAGNOSIS — Z8673 Personal history of transient ischemic attack (TIA), and cerebral infarction without residual deficits: Secondary | ICD-10-CM

## 2012-04-13 DIAGNOSIS — M1710 Unilateral primary osteoarthritis, unspecified knee: Principal | ICD-10-CM | POA: Diagnosis present

## 2012-04-13 DIAGNOSIS — E039 Hypothyroidism, unspecified: Secondary | ICD-10-CM | POA: Diagnosis present

## 2012-04-13 DIAGNOSIS — Z853 Personal history of malignant neoplasm of breast: Secondary | ICD-10-CM

## 2012-04-13 DIAGNOSIS — Z7901 Long term (current) use of anticoagulants: Secondary | ICD-10-CM

## 2012-04-13 HISTORY — PX: TOTAL KNEE ARTHROPLASTY: SHX125

## 2012-04-13 LAB — PROTIME-INR: Prothrombin Time: 13.5 seconds (ref 11.6–15.2)

## 2012-04-13 LAB — APTT: aPTT: 36 seconds (ref 24–37)

## 2012-04-13 SURGERY — ARTHROPLASTY, KNEE, TOTAL
Anesthesia: General | Site: Knee | Laterality: Right | Wound class: Clean

## 2012-04-13 MED ORDER — METHOCARBAMOL 100 MG/ML IJ SOLN: 500.0000 mg | Freq: Four times a day (QID) | INTRAVENOUS | Status: DC | PRN

## 2012-04-13 MED ORDER — AMIODARONE HCL 200 MG PO TABS
200.0000 mg | ORAL_TABLET | ORAL | Status: DC
  Administered 2012-04-14 – 2012-04-15 (×2): 200 mg via ORAL
  Filled 2012-04-13 (×2): qty 1

## 2012-04-13 MED ORDER — SUCCINYLCHOLINE CHLORIDE 20 MG/ML IJ SOLN
INTRAMUSCULAR | Status: DC | PRN
  Administered 2012-04-13: 100 mg via INTRAVENOUS

## 2012-04-13 MED ORDER — NEOSTIGMINE METHYLSULFATE 1 MG/ML IJ SOLN
INTRAMUSCULAR | Status: DC | PRN
  Administered 2012-04-13: 3 mg via INTRAVENOUS

## 2012-04-13 MED ORDER — HYDROMORPHONE HCL PF 1 MG/ML IJ SOLN
0.2500 mg | INTRAMUSCULAR | Status: DC | PRN
  Administered 2012-04-13 (×3): 0.5 mg via INTRAVENOUS

## 2012-04-13 MED ORDER — ZOLPIDEM TARTRATE 5 MG PO TABS: 5.0000 mg | ORAL_TABLET | Freq: Every evening | ORAL | Status: DC | PRN

## 2012-04-13 MED ORDER — ACETAMINOPHEN 10 MG/ML IV SOLN
INTRAVENOUS | Status: DC | PRN
  Administered 2012-04-13: 1000 mg via INTRAVENOUS

## 2012-04-13 MED ORDER — METOCLOPRAMIDE HCL 10 MG PO TABS: 5.0000 mg | ORAL_TABLET | Freq: Three times a day (TID) | ORAL | Status: DC | PRN

## 2012-04-13 MED ORDER — ONDANSETRON HCL 4 MG/2ML IJ SOLN
4.0000 mg | Freq: Four times a day (QID) | INTRAMUSCULAR | Status: DC | PRN
  Administered 2012-04-13: 4 mg via INTRAVENOUS
  Filled 2012-04-13: qty 2

## 2012-04-13 MED ORDER — LORAZEPAM 2 MG/ML IJ SOLN: 1.0000 mg | Freq: Once | INTRAMUSCULAR | Status: DC | PRN

## 2012-04-13 MED ORDER — FUROSEMIDE 40 MG PO TABS: 40.0000 mg | ORAL_TABLET | Freq: Every day | ORAL | Status: DC | PRN

## 2012-04-13 MED ORDER — MENTHOL 3 MG MT LOZG
1.0000 | LOZENGE | OROMUCOSAL | Status: DC | PRN
  Filled 2012-04-13: qty 9

## 2012-04-13 MED ORDER — CELECOXIB 200 MG PO CAPS
200.0000 mg | ORAL_CAPSULE | Freq: Two times a day (BID) | ORAL | Status: DC
  Administered 2012-04-13 – 2012-04-15 (×5): 200 mg via ORAL
  Filled 2012-04-13 (×7): qty 1

## 2012-04-13 MED ORDER — BUPIVACAINE 0.25 % ON-Q PUMP SINGLE CATH 300ML
300.0000 mL | INJECTION | Status: DC
  Filled 2012-04-13: qty 300

## 2012-04-13 MED ORDER — HYDROMORPHONE HCL PF 1 MG/ML IJ SOLN
INTRAMUSCULAR | Status: AC
  Filled 2012-04-13: qty 2

## 2012-04-13 MED ORDER — DEXTROSE 5 % IV SOLN
INTRAVENOUS | Status: DC | PRN
  Administered 2012-04-13 (×2): via INTRAVENOUS

## 2012-04-13 MED ORDER — ACETAMINOPHEN 650 MG RE SUPP: 650.0000 mg | Freq: Four times a day (QID) | RECTAL | Status: DC | PRN

## 2012-04-13 MED ORDER — LABETALOL HCL 5 MG/ML IV SOLN
INTRAVENOUS | Status: DC | PRN
  Administered 2012-04-13 (×3): 5 mg via INTRAVENOUS

## 2012-04-13 MED ORDER — ENOXAPARIN SODIUM 100 MG/ML ~~LOC~~ SOLN
100.0000 mg | Freq: Two times a day (BID) | SUBCUTANEOUS | Status: DC
  Administered 2012-04-14 – 2012-04-15 (×3): 100 mg via SUBCUTANEOUS
  Filled 2012-04-13 (×5): qty 1

## 2012-04-13 MED ORDER — DEXAMETHASONE SODIUM PHOSPHATE 4 MG/ML IJ SOLN
INTRAMUSCULAR | Status: DC | PRN
  Administered 2012-04-13: 8 mg via INTRAVENOUS
  Administered 2012-04-13: 4 mg

## 2012-04-13 MED ORDER — MIDAZOLAM HCL 2 MG/2ML IJ SOLN: 1.0000 mg | INTRAMUSCULAR | Status: DC | PRN

## 2012-04-13 MED ORDER — BISACODYL 5 MG PO TBEC: 5.0000 mg | DELAYED_RELEASE_TABLET | Freq: Every day | ORAL | Status: DC | PRN

## 2012-04-13 MED ORDER — CEFAZOLIN SODIUM-DEXTROSE 2-3 GM-% IV SOLR
2.0000 g | Freq: Four times a day (QID) | INTRAVENOUS | Status: AC
  Administered 2012-04-13 (×2): 2 g via INTRAVENOUS
  Filled 2012-04-13 (×2): qty 50

## 2012-04-13 MED ORDER — FENTANYL CITRATE 0.05 MG/ML IJ SOLN
INTRAMUSCULAR | Status: DC | PRN
  Administered 2012-04-13: 50 ug via INTRAVENOUS
  Administered 2012-04-13 (×2): 100 ug via INTRAVENOUS

## 2012-04-13 MED ORDER — ALUM & MAG HYDROXIDE-SIMETH 200-200-20 MG/5ML PO SUSP: 30.0000 mL | ORAL | Status: DC | PRN

## 2012-04-13 MED ORDER — ENOXAPARIN SODIUM 30 MG/0.3ML ~~LOC~~ SOLN: 30.0000 mg | Freq: Two times a day (BID) | SUBCUTANEOUS | Status: DC

## 2012-04-13 MED ORDER — OXYCODONE HCL 10 MG PO TB12
10.0000 mg | ORAL_TABLET | Freq: Two times a day (BID) | ORAL | Status: DC
  Administered 2012-04-13 – 2012-04-14 (×4): 10 mg via ORAL
  Filled 2012-04-13 (×4): qty 1

## 2012-04-13 MED ORDER — METHOCARBAMOL 500 MG PO TABS
500.0000 mg | ORAL_TABLET | Freq: Four times a day (QID) | ORAL | Status: DC | PRN
  Administered 2012-04-14: 500 mg via ORAL
  Filled 2012-04-13: qty 1

## 2012-04-13 MED ORDER — BUPIVACAINE HCL (PF) 0.25 % IJ SOLN
INTRAMUSCULAR | Status: AC
  Filled 2012-04-13: qty 30

## 2012-04-13 MED ORDER — WARFARIN SODIUM 5 MG PO TABS
5.0000 mg | ORAL_TABLET | Freq: Once | ORAL | Status: AC
  Administered 2012-04-13: 5 mg via ORAL
  Filled 2012-04-13: qty 1

## 2012-04-13 MED ORDER — BUPIVACAINE ON-Q PAIN PUMP (FOR ORDER SET NO CHG)
INJECTION | Status: DC
  Filled 2012-04-13: qty 1

## 2012-04-13 MED ORDER — PROPOFOL 10 MG/ML IV EMUL
INTRAVENOUS | Status: DC | PRN
  Administered 2012-04-13: 250 mg via INTRAVENOUS
  Administered 2012-04-13: 50 mg via INTRAVENOUS

## 2012-04-13 MED ORDER — LACTATED RINGERS IV SOLN
INTRAVENOUS | Status: DC | PRN
  Administered 2012-04-13 (×2): via INTRAVENOUS

## 2012-04-13 MED ORDER — PHENOL 1.4 % MT LIQD: 1.0000 | OROMUCOSAL | Status: DC | PRN

## 2012-04-13 MED ORDER — DOCUSATE SODIUM 100 MG PO CAPS
100.0000 mg | ORAL_CAPSULE | Freq: Two times a day (BID) | ORAL | Status: DC
  Administered 2012-04-13 – 2012-04-15 (×5): 100 mg via ORAL
  Filled 2012-04-13 (×5): qty 1

## 2012-04-13 MED ORDER — ACETAMINOPHEN 325 MG PO TABS: 650.0000 mg | ORAL_TABLET | Freq: Four times a day (QID) | ORAL | Status: DC | PRN

## 2012-04-13 MED ORDER — GLYCOPYRROLATE 0.2 MG/ML IJ SOLN
INTRAMUSCULAR | Status: DC | PRN
  Administered 2012-04-13: 0.6 mg via INTRAVENOUS

## 2012-04-13 MED ORDER — DIPHENHYDRAMINE HCL 12.5 MG/5ML PO ELIX
12.5000 mg | ORAL_SOLUTION | ORAL | Status: DC | PRN
Start: 2012-04-13 — End: 2012-04-15
  Administered 2012-04-15: 25 mg via ORAL
  Filled 2012-04-13: qty 10

## 2012-04-13 MED ORDER — OXYCODONE HCL 5 MG PO TABS
5.0000 mg | ORAL_TABLET | ORAL | Status: DC | PRN
  Administered 2012-04-14: 10 mg via ORAL
  Administered 2012-04-14 – 2012-04-15 (×2): 5 mg via ORAL
  Administered 2012-04-15: 10 mg via ORAL
  Filled 2012-04-13 (×2): qty 2
  Filled 2012-04-13: qty 1
  Filled 2012-04-13: qty 2

## 2012-04-13 MED ORDER — SENNOSIDES-DOCUSATE SODIUM 8.6-50 MG PO TABS: 1.0000 | ORAL_TABLET | Freq: Every evening | ORAL | Status: DC | PRN

## 2012-04-13 MED ORDER — MIDAZOLAM HCL 5 MG/5ML IJ SOLN
INTRAMUSCULAR | Status: DC | PRN
  Administered 2012-04-13: 2 mg via INTRAVENOUS

## 2012-04-13 MED ORDER — FLEET ENEMA 7-19 GM/118ML RE ENEM: 1.0000 | ENEMA | Freq: Once | RECTAL | Status: AC | PRN

## 2012-04-13 MED ORDER — BUPIVACAINE-EPINEPHRINE PF 0.5-1:200000 % IJ SOLN
INTRAMUSCULAR | Status: DC | PRN
  Administered 2012-04-13: 25 mL

## 2012-04-13 MED ORDER — HYDROMORPHONE HCL PF 1 MG/ML IJ SOLN
1.0000 mg | INTRAMUSCULAR | Status: DC | PRN
  Administered 2012-04-13 – 2012-04-14 (×2): 1 mg via INTRAVENOUS
  Filled 2012-04-13 (×2): qty 1

## 2012-04-13 MED ORDER — METOCLOPRAMIDE HCL 5 MG/ML IJ SOLN: 5.0000 mg | Freq: Three times a day (TID) | INTRAMUSCULAR | Status: DC | PRN

## 2012-04-13 MED ORDER — LEVOTHYROXINE SODIUM 175 MCG PO TABS
175.0000 ug | ORAL_TABLET | ORAL | Status: DC
  Administered 2012-04-14 – 2012-04-15 (×2): 175 ug via ORAL
  Filled 2012-04-13 (×3): qty 1

## 2012-04-13 MED ORDER — SODIUM CHLORIDE 0.9 % IR SOLN
Status: DC | PRN
  Administered 2012-04-13: 1000 mL

## 2012-04-13 MED ORDER — BUPIVACAINE HCL 0.25 % IJ SOLN
INTRAMUSCULAR | Status: DC | PRN
  Administered 2012-04-13: 20 mL via INTRA_ARTICULAR

## 2012-04-13 MED ORDER — EPHEDRINE SULFATE 50 MG/ML IJ SOLN
INTRAMUSCULAR | Status: DC | PRN
  Administered 2012-04-13: 5 mg via INTRAVENOUS
  Administered 2012-04-13 (×2): 10 mg via INTRAVENOUS

## 2012-04-13 MED ORDER — BUPIVACAINE 0.25 % ON-Q PUMP SINGLE CATH 300ML
INJECTION | Status: DC | PRN
  Administered 2012-04-13: 300 mL

## 2012-04-13 MED ORDER — ONDANSETRON HCL 4 MG PO TABS: 4.0000 mg | ORAL_TABLET | Freq: Four times a day (QID) | ORAL | Status: DC | PRN

## 2012-04-13 MED ORDER — ROCURONIUM BROMIDE 100 MG/10ML IV SOLN
INTRAVENOUS | Status: DC | PRN
  Administered 2012-04-13: 50 mg via INTRAVENOUS

## 2012-04-13 MED ORDER — SODIUM CHLORIDE 0.9 % IV SOLN
INTRAVENOUS | Status: DC
  Administered 2012-04-13: 14:00:00 via INTRAVENOUS

## 2012-04-13 MED ORDER — LIDOCAINE HCL (CARDIAC) 20 MG/ML IV SOLN
INTRAVENOUS | Status: DC | PRN
  Administered 2012-04-13: 100 mg via INTRAVENOUS

## 2012-04-13 MED ORDER — FENTANYL CITRATE 0.05 MG/ML IJ SOLN: 50.0000 ug | INTRAMUSCULAR | Status: DC | PRN

## 2012-04-13 MED ORDER — ACETAMINOPHEN 10 MG/ML IV SOLN
1000.0000 mg | Freq: Four times a day (QID) | INTRAVENOUS | Status: AC
  Administered 2012-04-13 – 2012-04-14 (×4): 1000 mg via INTRAVENOUS
  Filled 2012-04-13 (×4): qty 100

## 2012-04-13 MED ORDER — ACETAMINOPHEN 10 MG/ML IV SOLN
INTRAVENOUS | Status: AC
  Filled 2012-04-13: qty 100

## 2012-04-13 MED ORDER — WARFARIN - PHARMACIST DOSING INPATIENT
Freq: Every day | Status: DC
  Administered 2012-04-14: 18:00:00

## 2012-04-13 MED ORDER — SIMVASTATIN 10 MG PO TABS
10.0000 mg | ORAL_TABLET | Freq: Every day | ORAL | Status: DC
  Administered 2012-04-14: 10 mg via ORAL
  Filled 2012-04-13 (×3): qty 1

## 2012-04-13 SURGICAL SUPPLY — 62 items
BANDAGE ELASTIC 4 VELCRO ST LF (GAUZE/BANDAGES/DRESSINGS) ×2 IMPLANT
BANDAGE ELASTIC 6 VELCRO ST LF (GAUZE/BANDAGES/DRESSINGS) ×2 IMPLANT
BANDAGE ESMARK 6X9 LF (GAUZE/BANDAGES/DRESSINGS) ×1 IMPLANT
BLADE SAGITTAL 13X1.27X60 (BLADE) ×4 IMPLANT
BLADE SAW SGTL 83.5X18.5 (BLADE) ×2 IMPLANT
BNDG ESMARK 6X9 LF (GAUZE/BANDAGES/DRESSINGS) ×2
BOWL SMART MIX CTS (DISPOSABLE) ×2 IMPLANT
CATH KIT ON Q 5IN SLV (PAIN MANAGEMENT) ×2 IMPLANT
CEMENT BONE SIMPLEX SPEEDSET (Cement) ×6 IMPLANT
CLOTH BEACON ORANGE TIMEOUT ST (SAFETY) ×2 IMPLANT
COVER BACK TABLE 24X17X13 BIG (DRAPES) IMPLANT
COVER SURGICAL LIGHT HANDLE (MISCELLANEOUS) ×2 IMPLANT
CUFF TOURNIQUET SINGLE 34IN LL (TOURNIQUET CUFF) ×2 IMPLANT
DRAPE EXTREMITY T 121X128X90 (DRAPE) ×2 IMPLANT
DRAPE INCISE IOBAN 66X45 STRL (DRAPES) ×4 IMPLANT
DRAPE PROXIMA HALF (DRAPES) ×2 IMPLANT
DRAPE U-SHAPE 47X51 STRL (DRAPES) ×2 IMPLANT
DRSG ADAPTIC 3X8 NADH LF (GAUZE/BANDAGES/DRESSINGS) ×2 IMPLANT
DRSG PAD ABDOMINAL 8X10 ST (GAUZE/BANDAGES/DRESSINGS) ×2 IMPLANT
DURAPREP 26ML APPLICATOR (WOUND CARE) ×4 IMPLANT
ELECT REM PT RETURN 9FT ADLT (ELECTROSURGICAL) ×2
ELECTRODE REM PT RTRN 9FT ADLT (ELECTROSURGICAL) ×1 IMPLANT
EVACUATOR 1/8 PVC DRAIN (DRAIN) ×2 IMPLANT
GLOVE BIO SURGEON STRL SZ 6.5 (GLOVE) ×2 IMPLANT
GLOVE BIO SURGEON STRL SZ7.5 (GLOVE) ×6 IMPLANT
GLOVE BIOGEL M 7.0 STRL (GLOVE) IMPLANT
GLOVE BIOGEL PI IND STRL 6.5 (GLOVE) ×1 IMPLANT
GLOVE BIOGEL PI IND STRL 7.5 (GLOVE) IMPLANT
GLOVE BIOGEL PI IND STRL 8.5 (GLOVE) ×2 IMPLANT
GLOVE BIOGEL PI INDICATOR 6.5 (GLOVE) ×1
GLOVE BIOGEL PI INDICATOR 7.5 (GLOVE)
GLOVE BIOGEL PI INDICATOR 8.5 (GLOVE) ×2
GLOVE SURG ORTHO 8.0 STRL STRW (GLOVE) ×6 IMPLANT
GOWN PREVENTION PLUS XLARGE (GOWN DISPOSABLE) ×4 IMPLANT
GOWN STRL NON-REIN LRG LVL3 (GOWN DISPOSABLE) ×4 IMPLANT
HANDPIECE INTERPULSE COAX TIP (DISPOSABLE) ×1
HOOD PEEL AWAY FACE SHEILD DIS (HOOD) ×8 IMPLANT
KIT BASIN OR (CUSTOM PROCEDURE TRAY) ×4 IMPLANT
KIT ROOM TURNOVER OR (KITS) ×2 IMPLANT
MANIFOLD NEPTUNE II (INSTRUMENTS) ×2 IMPLANT
NEEDLE 22X1 1/2 (OR ONLY) (NEEDLE) ×2 IMPLANT
NS IRRIG 1000ML POUR BTL (IV SOLUTION) ×2 IMPLANT
PACK TOTAL JOINT (CUSTOM PROCEDURE TRAY) ×2 IMPLANT
PAD ARMBOARD 7.5X6 YLW CONV (MISCELLANEOUS) ×4 IMPLANT
PADDING CAST COTTON 6X4 STRL (CAST SUPPLIES) ×2 IMPLANT
POSITIONER HEAD PRONE TRACH (MISCELLANEOUS) ×2 IMPLANT
SET HNDPC FAN SPRY TIP SCT (DISPOSABLE) ×1 IMPLANT
SPONGE GAUZE 4X4 12PLY (GAUZE/BANDAGES/DRESSINGS) ×2 IMPLANT
STAPLER VISISTAT 35W (STAPLE) ×2 IMPLANT
SUCTION FRAZIER TIP 10 FR DISP (SUCTIONS) ×2 IMPLANT
SUT BONE WAX W31G (SUTURE) ×2 IMPLANT
SUT VIC AB 0 CTB1 27 (SUTURE) ×4 IMPLANT
SUT VIC AB 1 CT1 27 (SUTURE) ×1
SUT VIC AB 1 CT1 27XBRD ANBCTR (SUTURE) ×1 IMPLANT
SUT VIC AB 2-0 CT1 27 (SUTURE) ×2
SUT VIC AB 2-0 CT1 TAPERPNT 27 (SUTURE) ×2 IMPLANT
SUT VLOC 180 0 24IN GS25 (SUTURE) ×2 IMPLANT
SYR CONTROL 10ML LL (SYRINGE) ×2 IMPLANT
TOWEL OR 17X24 6PK STRL BLUE (TOWEL DISPOSABLE) ×2 IMPLANT
TOWEL OR 17X26 10 PK STRL BLUE (TOWEL DISPOSABLE) ×2 IMPLANT
TRAY FOLEY CATH 14FR (SET/KITS/TRAYS/PACK) ×2 IMPLANT
WATER STERILE IRR 1000ML POUR (IV SOLUTION) ×6 IMPLANT

## 2012-04-13 NOTE — Anesthesia Preprocedure Evaluation (Addendum)
Anesthesia Evaluation  Patient identified by MRN, date of birth, ID band Patient awake    Reviewed: Allergy & Precautions, H&P , NPO status , Patient's Chart, lab work & pertinent test results  Airway Mallampati: II TM Distance: <3 FB Neck ROM: Full  Mouth opening: Limited Mouth Opening  Dental  (+) Teeth Intact and Dental Advisory Given   Pulmonary shortness of breath, neg pneumonia -,    Pulmonary exam normal       Cardiovascular + dysrhythmias Atrial Fibrillation Rate:Normal  Conclusions   - Left ventricle: The cavity size was normal. Systolic function was    normal. The estimated ejection fraction was in the range of 55% to    60%. Wall motion was normal; there were no regional wall motion    abnormalities.  - Mitral valve: Calcified annulus.  - Left atrium: The atrium was mildly dilated.  - Atrial septum: The septum bowed from left to right, consistent    with increased left atrial pressure.  Transthoracic echocardiography. M-mode, complete 2D, spectral  Doppler, and color Doppler. Height: Height: 167.6cm. Height: 66in.  Weight: Weight: 118.4kg. Weight: 260.5lb. Body mass index: BMI:  42.1kg/m^2. Body surface area: BSA: 2.38m^2. Blood pressure: 120/74.  Patient status: Outpatient. Location: [facility]     Neuro/Psych CVA    GI/Hepatic negative GI ROS, (+) Hepatitis -, A  Endo/Other  Hypothyroidism Morbid obesity  Renal/GU   negative genitourinary   Musculoskeletal  (+) Arthritis -, Osteoarthritis,    Abdominal   Peds  Hematology   Anesthesia Other Findings Pt with small mouth.  TMJ dysfunction.  2.5 FB to TM  Reproductive/Obstetrics                       Anesthesia Physical Anesthesia Plan  ASA: III  Anesthesia Plan: General   Post-op Pain Management:    Induction: Intravenous  Airway Management Planned: Oral ETT  Additional Equipment:   Intra-op Plan:    Post-operative Plan: Extubation in OR  Informed Consent: I have reviewed the patients History and Physical, chart, labs and discussed the procedure including the risks, benefits and alternatives for the proposed anesthesia with the patient or authorized representative who has indicated his/her understanding and acceptance.     Plan Discussed with: CRNA and Surgeon  Anesthesia Plan Comments:         Anesthesia Quick Evaluation

## 2012-04-13 NOTE — Preoperative (Signed)
Beta Blockers   Reason not to administer Beta Blockers:Not Applicable 

## 2012-04-13 NOTE — Anesthesia Procedure Notes (Addendum)
Anesthesia Regional Block:  Femoral nerve block  Pre-Anesthetic Checklist: ,, timeout performed, Correct Patient, Correct Site, Correct Laterality, Correct Procedure, Correct Position, site marked, Risks and benefits discussed, Surgical consent,  Pre-op evaluation,  At surgeon's request  Laterality: Right  Prep: chloraprep       Needles:  Injection technique: Single-shot  Needle Type: Stimulator Needle - 80     Needle Length: 8cm  Needle Gauge: 22 and 22 G    Additional Needles:  Procedures: nerve stimulator Femoral nerve block  Nerve Stimulator or Paresthesia:  Response: 0.48 mA,   Additional Responses:   Narrative:  Start time: 04/13/2012 7:06 AM End time: 04/13/2012 7:16 AM Injection made incrementally with aspirations every 5 mL. Anesthesiologist: Dr Gypsy Balsam  Additional Notes: 1610-9604 POP R FNB CHG prep, sterile tech #22 stim needle w/stim down to .48ma Multiple neg asp Vernia Buff .5% w/epi+decadron 4mg  infiltrated No compl Dr Gypsy Balsam   Procedure Name: Intubation Date/Time: 04/13/2012 8:53 AM Performed by: Tyrone Nine Pre-anesthesia Checklist: Patient identified, Emergency Drugs available, Suction available, Patient being monitored and Timeout performed Patient Re-evaluated:Patient Re-evaluated prior to inductionOxygen Delivery Method: Circle system utilized Preoxygenation: Pre-oxygenation with 100% oxygen Intubation Type: IV induction Ventilation: Mask ventilation without difficulty and Oral airway inserted - appropriate to patient size Laryngoscope Size: Hyacinth Meeker and 2 Grade View: Grade II Tube type: Oral Tube size: 7.5 mm Number of attempts: 1 Airway Equipment and Method: Stylet Placement Confirmation: ETT inserted through vocal cords under direct vision,  breath sounds checked- equal and bilateral and positive ETCO2 Secured at: 21 cm Tube secured with: Tape Dental Injury: Teeth and Oropharynx as per pre-operative assessment

## 2012-04-13 NOTE — Progress Notes (Signed)
Orthopedic Tech Progress Note Patient Details:  Kayla Harrison [facility] 11-Apr-1946 [id number] OHF with trapeze applied CPM Right Knee CPM Right Knee: On Right Knee Flexion (Degrees): 90  Right Knee Extension (Degrees): 0    Asia R Thompson 04/13/2012, 12:26 PM

## 2012-04-13 NOTE — Transfer of Care (Signed)
Immediate Anesthesia Transfer of Care Note  Patient: Kayla Harrison [facility]  Procedure(s) Performed: Procedure(s) (LRB): TOTAL KNEE ARTHROPLASTY (Right)  Patient Location: PACU  Anesthesia Type: General  Level of Consciousness: awake, alert , oriented and patient cooperative  Airway & Oxygen Therapy: Patient Spontanous Breathing and Patient connected to nasal cannula oxygen  Post-op Assessment: Report given to PACU RN and Post -op Vital signs reviewed and stable  Post vital signs: Reviewed and stable  Complications: No apparent anesthesia complications

## 2012-04-13 NOTE — Progress Notes (Signed)
ANTICOAGULATION CONSULT NOTE - Initial Consult  Pharmacy Consult for Coumadin, Lovenox Indication: atrial fibrillation, hx CVA, VTE prophylaxis  Allergies  Allergen Reactions  . Xarelto (Rivaroxaban)     Nose Bleed X 6 hrs ; packing in ER  . Epinephrine Other (See Comments)    Dental form only (liquid). Patient stated she will become "out of it" she can hear you but cannot respond in normal fashion.    Patient Measurements:   Wt=107kg  Vital Signs: Temp: 97.4 F (36.3 C) (07/22 1327) Temp src: Oral (07/22 0611) BP: 142/60 mmHg (07/22 1241) Pulse Rate: 59  (07/22 1327)  Labs:  Alvira Philips 04/13/12 0618  HGB --  HCT --  PLT --  APTT 36  LABPROT 13.5  INR 1.01  HEPARINUNFRC --  CREATININE --  CKTOTAL --  CKMB --  TROPONINI --    The CrCl is unknown because both a height and weight (above a minimum accepted value) are required for this calculation.   Medical History: Past Medical History  Diagnosis Date  . Morbid obesity     Status post lap band surgery  . Hx of thyroid cancer     Dr Evlyn Kanner  . Colon polyp     Dr Kinnie Scales  . Dyslipidemia   . Hyperlipidemia   . Hypothyroidism   . Breast cancer     Dr Jamey Ripa, total thyroidectomy- 1999- for cancer  . Pneumonia     2010- hosp. here [facility]- during that event had to be cardioverted   . Arthritis     osteoarthritis - knees   . Normal cardiac stress test     2008- cardiac stress/echo  . Stroke     2003- [place]   . Atrial fibrillation     ablation- 2x's-- 1st time- [facility], 2nd event at [facility] in 2008. Dr. Graciela Husbands- [facility]- sees pt. once /yr. ,  . Hepatitis     [place] ?hepatitis   . Complication of anesthesia     Ketamine produces LSD reaction, bright colored nightmarish experience    Home Coumadin dose:  2.5 mg po daily except 5 mg on Mon, Wed, Fri  Assessment: 66 yo F admitted for rt TKA and to continue chronic anticoagulation post-op.  Dr. Graciela Husbands had put her on bridge  therapy with Lovenox until surgery and this is to continue after surgery with Coumadin until INR therapeutic.  Last dose of Lovenox 100 mg was 7/21, while last Coumadin dose 7/16.  INR 1.01 today.  Orders to resume full dose Lovenox 7/23 at 0800.  Goal of Therapy:  INR 2-3 Monitor platelets by anticoagulation protocol: Yes   Plan:  1.  Coumadin 5 mg po x 1 tonight 2.  Daily PT/INR 3.  Lovenox 100 mg SQ q12hr, to begin 7/23 at 0800 4.  CBC q72hr while on Lovenox  Rolland Porter, Pharm.D., BCPS Clinical Pharmacist Pager: [phone] 04/13/2012,2:42 PM

## 2012-04-13 NOTE — H&P (Signed)
Kayla Harrison MRN:  [id number] DOB/SEX:  1946-07-01/female  CHIEF COMPLAINT:  Painful right Knee  HISTORY: Patient is a 66 y.o. female presented with a history of pain in the right knee. Onset of symptoms was gradual starting several years ago with gradually worsening course since that time. The patient noted no past surgery on the right knee. Prior procedures on the knee include arthroscopy. Patient has been treated conservatively with over-the-counter NSAIDs and activity modification. Patient currently rates pain in the knee at 9 out of 10 with activity. There is pain at night.  PAST MEDICAL HISTORY: Patient Active Problem List   Diagnosis Date Noted  . Unspecified adverse effect of unspecified drug, medicinal and biological substance 02/11/2012  . Anticoagulant long-term use 12/16/2011  . Encounter for long-term (current) use of anticoagulants 12/20/2010  . EPIDERMOID CYST 08/22/2010  . CHRONIC DIASTOLIC HEART FAILURE 08/06/2010  . SYNCOPE AND COLLAPSE 07/27/2010  . OSTEOARTHRITIS, KNEE, RIGHT 03/15/2010  . Morbid obesity 12/07/2009  . NONSPEC ELEVATION OF LEVELS OF TRANSAMINASE/LDH 09/08/2009  . CVA 08/14/2009  . BREAST CANCER, HX OF 07/25/2009  . COLONIC POLYPS, HX OF 07/25/2009  . TUBULOVILLOUS ADENOMA, COLON 04/29/2008  . HEMATURIA 04/29/2008  . HYPERGLYCEMIA, FASTING 04/29/2008  . HYPERLIPIDEMIA 06/22/2007  . Atrial fibrillation 06/22/2007  . CARCINOMA, THYROID GLAND, HX OF 06/22/2007   Past Medical History  Diagnosis Date  . Morbid obesity     Status post lap band surgery  . Hx of thyroid cancer     Dr Evlyn Kanner  . Colon polyp     Dr Kinnie Scales  . Dyslipidemia   . Hyperlipidemia   . Hypothyroidism   . Breast cancer     Dr Jamey Ripa, total thyroidectomy- 1999- for cancer  . Pneumonia     2010- hosp. here [facility]- during that event had to be cardioverted   . Arthritis     osteoarthritis - knees   . Normal cardiac stress test     2008- cardiac stress/echo  . Stroke    2003- [place]   . Atrial fibrillation     ablation- 2x's-- 1st time- [facility], 2nd event at [facility] in 2008. Dr. Graciela Husbands- [place]- sees pt. once /yr. ,  . Hepatitis     [place] ?hepatitis   . Complication of anesthesia     Ketamine produces LSD reaction, bright colored nightmarish experience    Past Surgical History  Procedure Date  . Thyroidectomy 1998    Dr Jamey Ripa  . Bso 1998  . Cholecystectomy   . Tonsillectomy   . Colonoscopy w/ polypectomy     Dr Kinnie Scales  . Afib ablation     X 2; [facility] & Dr Graciela Husbands  . Abdominal hysterectomy 1983  . Breast lumpectomy 2010  . Laparoscopic gastric banding 07/10/2010  . Appendectomy   . Knee arthroscopy     bilateral  . Laparotomy     for ruptured ovary and ovarian artery      MEDICATIONS:   Prescriptions prior to admission  Medication Sig Dispense Refill  . amiodarone (PACERONE) 200 MG tablet Take 200 mg by mouth See admin instructions. mon-fri only doesn't take on Saturday or sunday      . Calcium Citrate-Vitamin D (CALCIUM CITRATE + PO) Take 1 tablet by mouth daily.       . celecoxib (CELEBREX) 200 MG capsule Take 200 mg by mouth daily.      . Cholecalciferol (VITAMIN D3) 1000 UNITS CAPS Take 1  capsule by mouth daily.       . Diphenhydramine-APAP, sleep, (TYLENOL PM EXTRA STRENGTH PO) Take 1-2 tablets by mouth 2 (two) times daily as needed. For sleep or pain      . enoxaparin (LOVENOX) 100 MG/ML injection Inject 1 mL (100 mg total) into the skin every 12 (twelve) hours.  7 Syringe  1  . furosemide (LASIX) 40 MG tablet Take 40 mg by mouth daily as needed. For fluid      . levothyroxine (SYNTHROID, LEVOTHROID) 175 MCG tablet Take 175 mcg by mouth See admin instructions. Take 6 days a week. Doesn't take on Saturday      . Multiple Vitamins-Minerals (MULTIVITAMINS THER. W/MINERALS) TABS Take 1 tablet by mouth daily.      . Omega-3 Fatty Acids (FISH OIL) 1000 MG CAPS Take 1 capsule by mouth daily.        .  pravastatin (PRAVACHOL) 20 MG tablet Take 1 tablet (20 mg total) by mouth daily. Take 1 tablet at bedtime.  90 tablet  1  . warfarin (COUMADIN) 5 MG tablet Take 2.5-5 mg by mouth daily. 5 mg on mon, wed, and fri and takes 2.5 mg on all other days        ALLERGIES:   Allergies  Allergen Reactions  . Xarelto (Rivaroxaban)     Nose Bleed X 6 hrs ; packing in ER  . Epinephrine Other (See Comments)    Dental form only (liquid). Patient stated she will become "out of it" she can hear you but cannot respond in normal fashion.    REVIEW OF SYSTEMS:  Pertinent items are noted in HPI.   FAMILY HISTORY:   Family History  Problem Relation Age of Onset  . Cancer Father     COLON  . Heart disease Father 43    MI @autopsy   . Diabetes Sister   . Diabetes Brother   . Diabetes Paternal Aunt   . Diabetes Paternal Grandmother     SOCIAL HISTORY:   History  Substance Use Topics  . Smoking status: Never Smoker   . Smokeless tobacco: Never Used  . Alcohol Use: No     EXAMINATION:  Vital signs in last 24 hours: Temp:  [98.6 F (37 C)] 98.6 F (37 C) (07/22 1610) Pulse Rate:  [63] 63  (07/22 0611) Resp:  [18] 18  (07/22 0611) BP: (113)/(73) 113/73 mmHg (07/22 0611) SpO2:  [96 %] 96 % (07/22 0611)  General appearance: alert, cooperative and no distress Lungs: clear to auscultation bilaterally Heart: regular rate and rhythm, S1, S2 normal, no murmur, click, rub or gallop Abdomen: soft, non-tender; bowel sounds normal; no masses,  no organomegaly Extremities: extremities normal, atraumatic, no cyanosis or edema and Homans sign is negative, no sign of DVT Pulses: 2+ and symmetric Skin: Skin color, texture, turgor normal. No rashes or lesions Neurologic: Alert and oriented X 3, normal strength and tone. Normal symmetric reflexes. Normal coordination and gait  Musculoskeletal:  ROM 0-110, Ligaments intact,  Imaging Review Plain radiographs demonstrate severe degenerative joint disease of  the right knee. The overall alignment is mild varus. The bone quality appears to be good for age and reported activity level.  Assessment/Plan: End stage arthritis, right knee   The patient history, physical examination and imaging studies are consistent with advanced degenerative joint disease of the right knee. The patient has failed conservative treatment.  The clearance notes were reviewed.  After discussion with the patient it was felt that Total Knee  Replacement was indicated. The procedure,  risks, and benefits of total knee arthroplasty were presented and reviewed. The risks including but not limited to aseptic loosening, infection, blood clots, vascular injury, stiffness, patella tracking problems complications among others were discussed. The patient acknowledged the explanation, agreed to proceed with the plan.  Dayne Chait 04/13/2012, 7:03 AM

## 2012-04-14 ENCOUNTER — Encounter (HOSPITAL_COMMUNITY): Payer: Self-pay | Admitting: Orthopedic Surgery

## 2012-04-14 LAB — BASIC METABOLIC PANEL
BUN: 17 mg/dL (ref 6–23)
Chloride: 103 mEq/L (ref 96–112)
Glucose, Bld: 131 mg/dL — ABNORMAL HIGH (ref 70–99)
Potassium: 4.5 mEq/L (ref 3.5–5.1)

## 2012-04-14 LAB — CBC
HCT: 29.4 % — ABNORMAL LOW (ref 36.0–46.0)
Hemoglobin: 9.6 g/dL — ABNORMAL LOW (ref 12.0–15.0)
RBC: 3.23 MIL/uL — ABNORMAL LOW (ref 3.87–5.11)
WBC: 12.3 10*3/uL — ABNORMAL HIGH (ref 4.0–10.5)

## 2012-04-14 IMAGING — MR MR HEAD W/O CM
6 of 8 series · 29 of 48 positions shown · non-contrast
Comparison: MRI 08/08/2003

CLINICAL DATA: Syncope

MRI HEAD WITHOUT CONTRAST
TECHNIQUE: Multiplanar, multiecho pulse sequences of the brain and
surrounding structures were obtained according to standard protocol
without intravenous contrast.

[Series 3: T1 · sagittal · 5.0mm · 0.47mm/px · 2 of 12 slices shown]
[im 1/12]
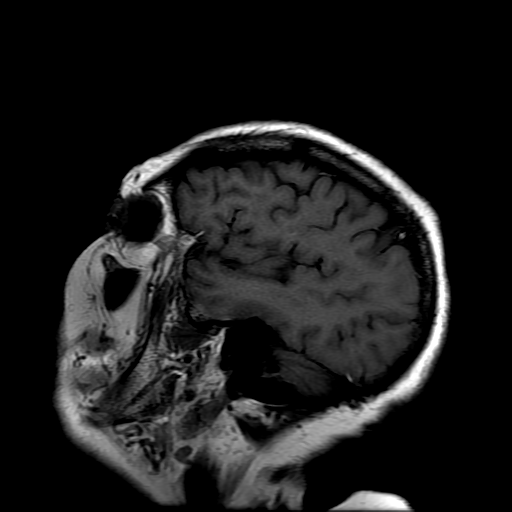
[im 6/12]
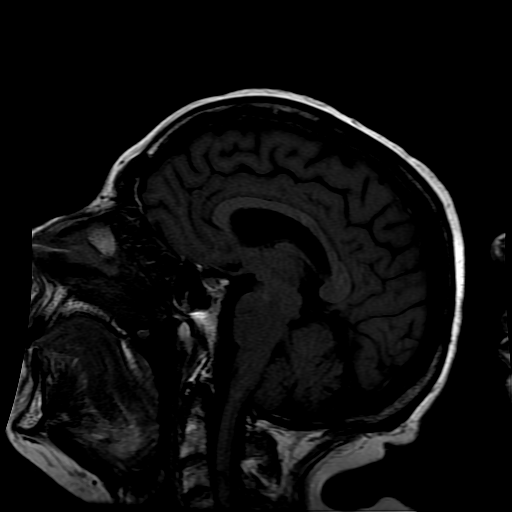

[Series 4: DWI · axial · 5.0mm · 1.09mm/px · z∈[-46,+78]mm · 9 of 52 slices shown (1 of 2)]
[im 1/52]
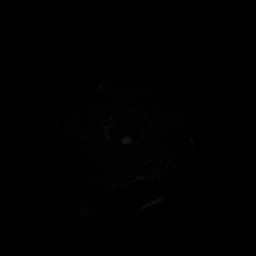
[im 10/52]
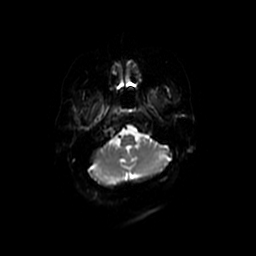
[im 14/52]
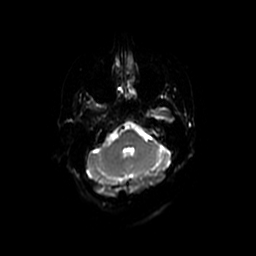
[im 24/52]
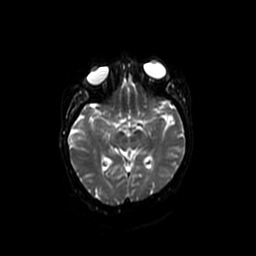
[im 28/52]
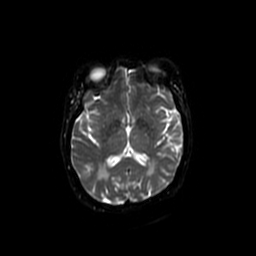
[im 38/52]
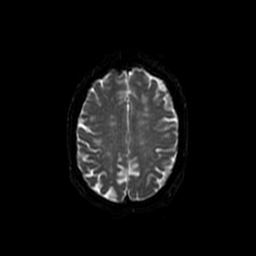
[im 42/52]
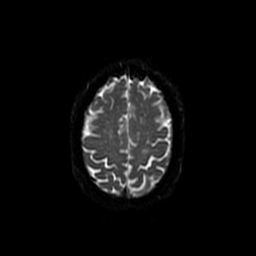
[im 47/52]
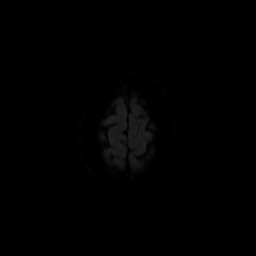
[im 52/52]
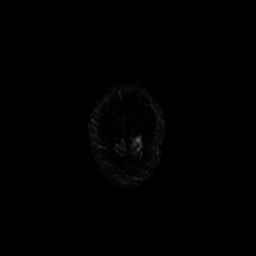

[Series 5: T2 · axial · 5.0mm · 0.43mm/px · z∈[-57,+69]mm · 4 of 21 slices shown (1 of 2)]
[im 1/21]
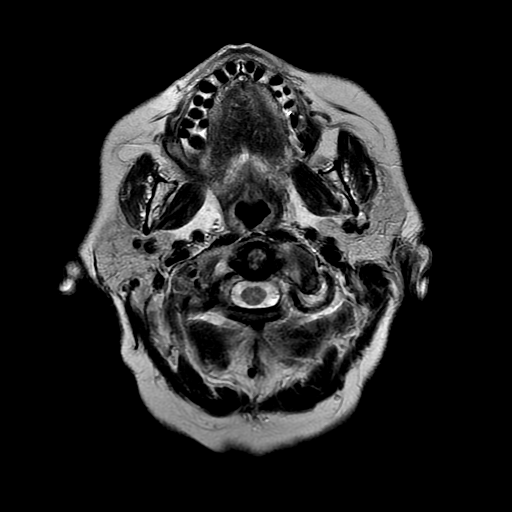
[im 7/21]
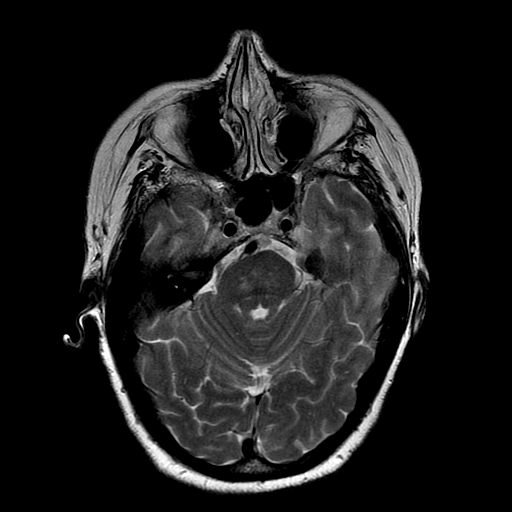
[im 14/21]
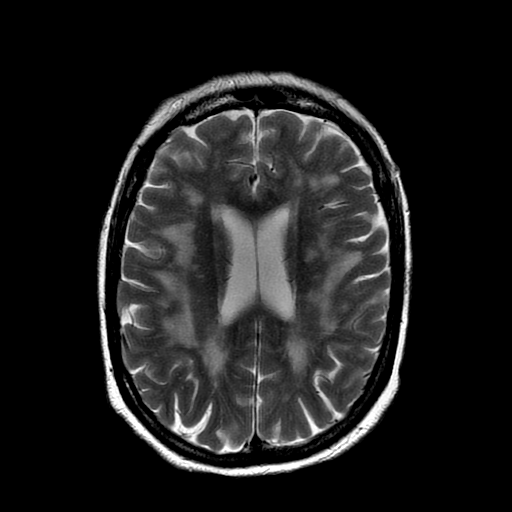
[im 21/21]
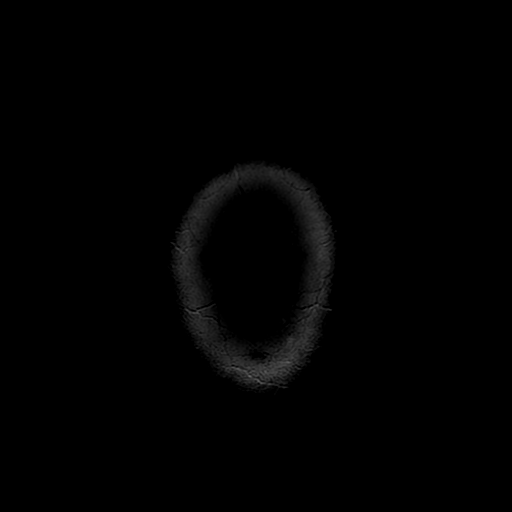

[Series 6: FLAIR · axial · 5.0mm · 0.43mm/px · z∈[-57,+69]mm · 4 of 21 slices shown]
[im 1/21]
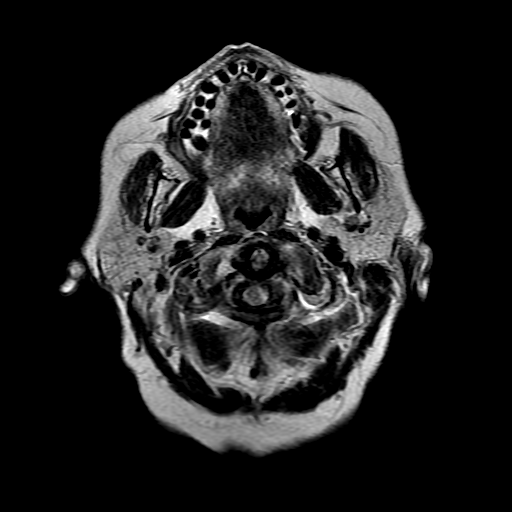
[im 7/21]
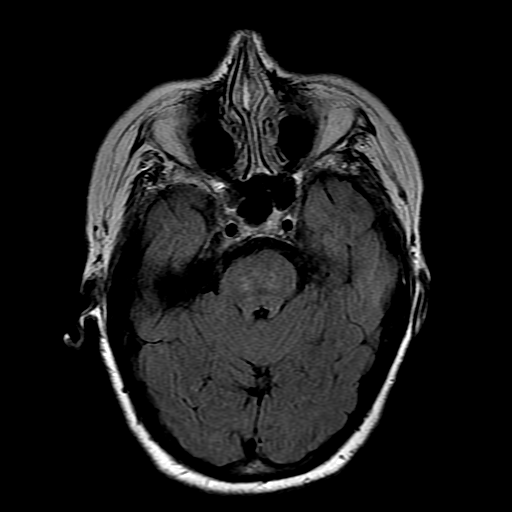
[im 14/21]
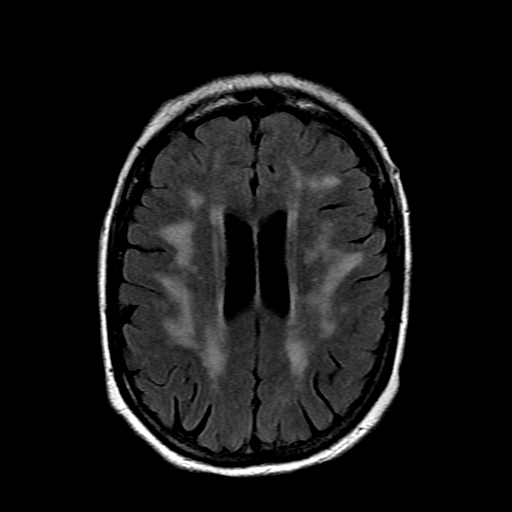
[im 21/21]
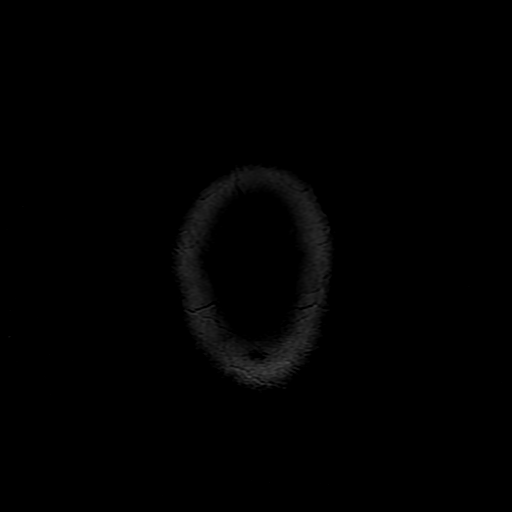

[Series 9: T2 · coronal · 5.0mm · 0.39mm/px · 5 of 25 slices shown (2 of 2)]
[im 1/25]
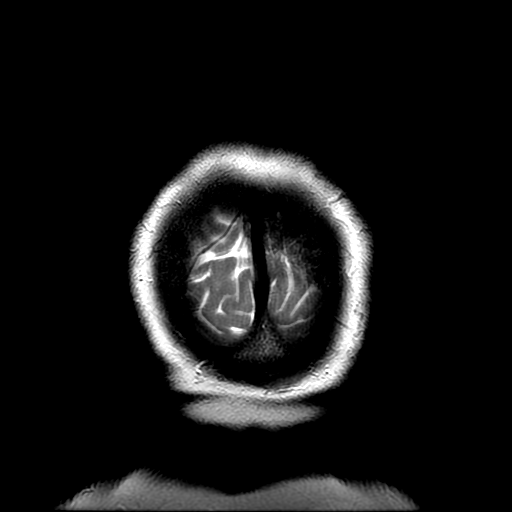
[im 7/25]
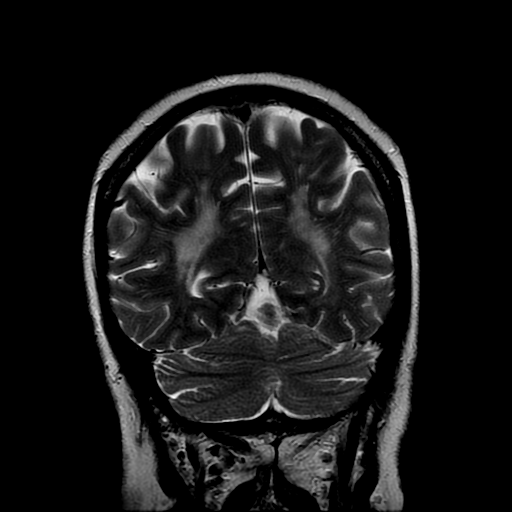
[im 13/25]
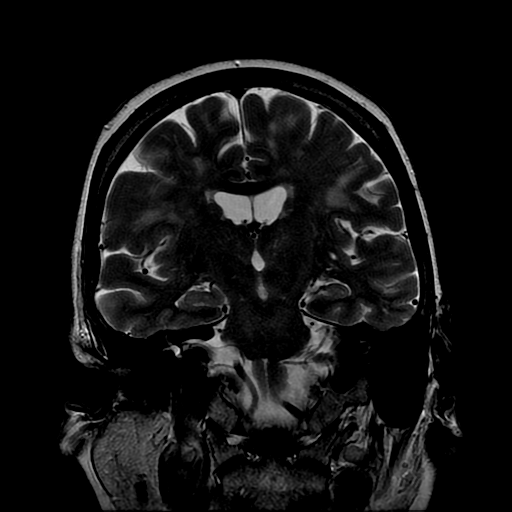
[im 19/25]
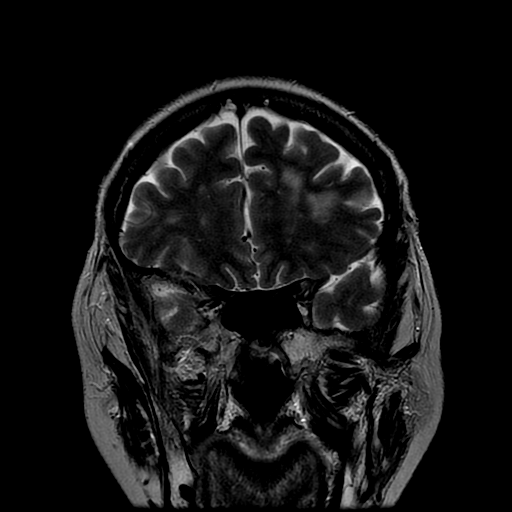
[im 25/25]
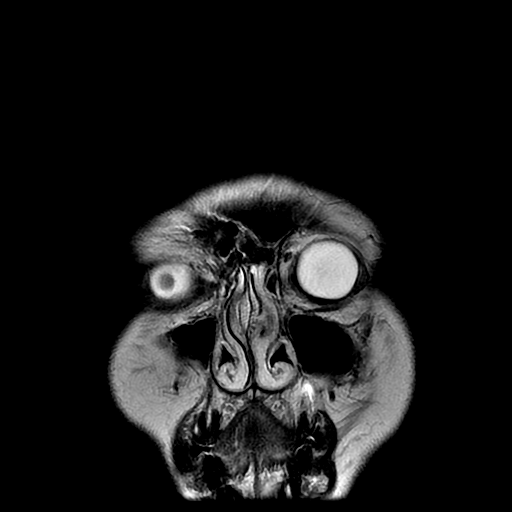

[Series 400: DWI · axial · 5.0mm · 1.09mm/px · z∈[-46,+78]mm · 5 of 26 slices shown (2 of 2)]
[im 1/26]
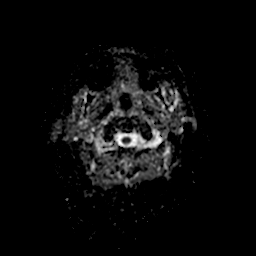
[im 7/26]
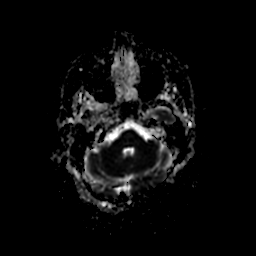
[im 13/26]
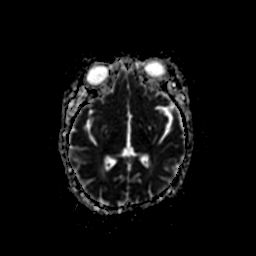
[im 19/26]
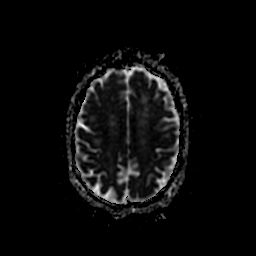
[im 26/26]
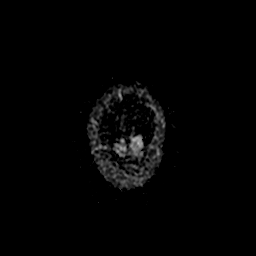

[29 of 48 positions shown; findings below may reference images not displayed]

FINDINGS: There  has  been significant progression in abnormal
signal throughout the cerebral white matter bilaterally.  There is
also progression of chronic ischemia in the pons bilaterally.
Chronic ischemia in the basal ganglia has progressed.  No acute
infarct.

Negative for mass lesion.  Tiny focus of chronic micro hemorrhage
in the left occipital lobe, likely related to hypertension.

Paranasal sinuses are clear.
IMPRESSION: There has been significant progression and chronic microvascular
ischemia since 6441.  No acute abnormality.

## 2012-04-14 MED ORDER — WARFARIN SODIUM 5 MG PO TABS
5.0000 mg | ORAL_TABLET | Freq: Once | ORAL | Status: AC
  Administered 2012-04-14: 5 mg via ORAL
  Filled 2012-04-14: qty 1

## 2012-04-14 NOTE — Progress Notes (Signed)
Physical Therapy Progress Note   04/14/12 1437  PT Visit Information  Last PT Received On 04/14/12  Assistance Needed +1  PT Time Calculation  PT Start Time 1358  PT Stop Time 1435  PT Time Calculation (min) 37 min  Subjective Data  Subjective "I'm more tired this afternoon."  Patient Stated Goal To go home  Precautions  Precautions Knee  Restrictions  Weight Bearing Restrictions Yes  RLE Weight Bearing WBAT  Cognition  Overall Cognitive Status Appears within functional limits for tasks assessed/performed  Arousal/Alertness Awake/alert  Orientation Level Appears intact for tasks assessed  Behavior During Session [facility] for tasks performed  Bed Mobility  Bed Mobility Supine to Sit;Sitting - Scoot to Edge of Bed;Sit to Supine  Supine to Sit 5: Supervision  Sitting - Scoot to Edge of Bed 5: Supervision  Sit to Supine 5: Supervision  Details for Bed Mobility Assistance Supervision for safety   Transfers  Transfers Sit to Stand;Stand to Sit  Sit to Stand 4: Min guard;From bed  Stand to Sit 4: Min guard;To chair/3-in-1  Details for Transfer Assistance Minguard for safety with cues for hand placement with transfers.  Pt needs max cues for hand placement especially with sit <> stand.  Ambulation/Gait  Ambulation/Gait Assistance 4: Min guard  Ambulation Distance (Feet) 125 Feet  Assistive device Rolling walker  Ambulation/Gait Assistance Details Minguard for safety with cues for proper step sequence.  Pt able to advance to step through gait.  Gait Pattern Step-through pattern;Decreased stride length  Stairs No  Exercises  Exercises Total Joint  Total Joint Exercises  Ankle Circles/Pumps Right;10 reps  Quad Sets AAROM;Strengthening;Right;10 reps  Heel Slides AAROM;Right;10 reps  Short Arc Quad Strengthening;Right;10 reps  Hip ABduction/ADduction Strengthening;Right;10 reps  Straight Leg Raises Strengthening;Right;10 reps  PT - End of Session  Equipment Utilized During Treatment  Gait belt  Activity Tolerance Patient tolerated treatment well  Patient left in bed;in CPM;with call bell/phone within reach  Nurse Communication Mobility status  PT - Assessment/Plan  Comments on Treatment Session Pt able to increase ambulation distance and improve overall mobility.  Pt left in CPM on 0 to 70 degrees.  Will continue to follow.  PT Plan Discharge plan remains appropriate;Frequency remains appropriate  PT Frequency 7X/week  Follow Up Recommendations Home health PT  Equipment Recommended None recommended by PT  Acute Rehab PT Goals  PT Goal Formulation With patient  Time For Goal Achievement 04/21/12  Potential to Achieve Goals Good  Pt will go Supine/Side to Sit with modified independence  PT Goal: Supine/Side to Sit - Progress Progressing toward goal  Pt will go Sit to Supine/Side with modified independence  PT Goal: Sit to Supine/Side - Progress Progressing toward goal  Pt will go Sit to Stand with modified independence  PT Goal: Sit to Stand - Progress Progressing toward goal  Pt will go Stand to Sit with modified independence  PT Goal: Stand to Sit - Progress Progressing toward goal  Pt will Perform Home Exercise Program Independently  PT Goal: Perform Home Exercise Program - Progress Progressing toward goal  PT General Charges  $$ ACUTE PT VISIT 1 Procedure  PT Treatments  $Gait Training 8-22 mins  $Therapeutic Exercise 23-37 mins    Blades, [place] DPT [phone]

## 2012-04-14 NOTE — Care Management Note (Signed)
    Page 1 of 1   04/14/2012     11:34:36 AM   CARE MANAGEMENT NOTE 04/14/2012  Patient:  Kayla Harrison, Kayla Harrison   Account Number:  [id number]  Date Initiated:  04/14/2012  Documentation initiated by:  Vance Peper  Subjective/Objective Assessment:   66 yr old female s/p right total knee arthroplasty     Action/Plan:   CM spoke with patient regarding home health needs at discharge. Patient preoperatively setup with [facility], no changes. CPM, rolling walker and 3in1 have been delivered to patient's home.   Anticipated DC Date:  04/15/2012   Anticipated DC Plan:  HOME W HOME HEALTH SERVICES      DC Planning Services  CM consult      [facility] Choice  HOME HEALTH   Choice offered to / List presented to:  C-1 Patient        HH arranged  HH-1 RN  HH-2 PT      [facility] agency  [facility]   Status of service:  Completed, signed off Medicare Important Message given?   (If response is "NO", the following Medicare IM given date fields will be blank) Date Medicare IM given:   Date Additional Medicare IM given:    Discharge Disposition:  HOME W HOME HEALTH SERVICES  Per UR Regulation:    If discussed at Long Length of Stay Meetings, dates discussed:    Comments:

## 2012-04-14 NOTE — Plan of Care (Signed)
Problem: Consults Goal: Diagnosis- Total Joint Replacement Primary Total Knee Right     

## 2012-04-14 NOTE — Anesthesia Postprocedure Evaluation (Deleted)
  Anesthesia Post-op Note  Patient: Kayla Harrison  Procedure(s) Performed: Procedure(s) (LRB): TOTAL KNEE ARTHROPLASTY (Right)  Patient Location: PACU  Anesthesia Type: GA combined with regional for post-op pain  Level of Consciousness: awake  Airway and Oxygen Therapy: Patient Spontanous Breathing  Post-op Pain: mild  Post-op Assessment: Post-op Vital signs reviewed, Patient's Cardiovascular Status Stable, Respiratory Function Stable, Patent Airway, No signs of Nausea or vomiting and Pain level controlled  Post-op Vital Signs: stable  Complications: No apparent anesthesia complications 

## 2012-04-14 NOTE — Progress Notes (Signed)
CARE MANAGEMENT NOTE 04/14/2012  Patient:  Kayla Harrison, Kayla Harrison   Account Number:  [id number]  Date Initiated:  04/14/2012  Documentation initiated by:  Vance Peper  Subjective/Objective Assessment:   66 yr old female s/p right total knee arthroplasty     Action/Plan:   CM spoke with patient regarding home health needs at discharge. Patient preoperatively setup with [facility], no changes. CPM, rolling walker and 3in1 have been delivered to patient's home.   Anticipated DC Date:  04/15/2012   Anticipated DC Plan:  HOME W HOME HEALTH SERVICES      DC Planning Services  CM consult      [facility] Choice  HOME HEALTH   Choice offered to / List presented to:  C-1 Patient        HH arranged  HH-1 RN  HH-2 PT      [facility] agency  [facility]   Status of service:  Completed, signed off Medicare Important Message given?   (If response is "NO", the following Medicare IM given date fields will be blank) Date Medicare IM given:   Date Additional Medicare IM given:    Discharge Disposition:  HOME W HOME HEALTH SERVICES  Per UR Regulation:    If discussed at Long Length of Stay Meetings, dates discussed:    Comments:

## 2012-04-14 NOTE — Progress Notes (Signed)
UR COMPLETED  

## 2012-04-14 NOTE — Progress Notes (Signed)
ANTICOAGULATION CONSULT NOTE - Initial Consult  Pharmacy Consult for Coumadin, Lovenox Indication: atrial fibrillation, hx CVA, VTE prophylaxis  Allergies  Allergen Reactions  . Xarelto (Rivaroxaban)     Nose Bleed X 6 hrs ; packing in ER  . Epinephrine Other (See Comments)    Dental form only (liquid). Patient stated she will become "out of it" she can hear you but cannot respond in normal fashion.    Patient Measurements:   Wt=107kg  Vital Signs: Temp: 97.8 F (36.6 C) (07/23 0556) Temp src: Oral (07/22 2148) BP: 109/45 mmHg (07/23 0556) Pulse Rate: 68  (07/23 0556)  Labs:  Kayla Harrison 04/14/12 0645 04/13/12 0618  HGB 9.6* --  HCT 29.4* --  PLT 144* --  APTT -- 36  LABPROT 15.4* 13.5  INR 1.19 1.01  HEPARINUNFRC -- --  CREATININE 0.86 --  CKTOTAL -- --  CKMB -- --  TROPONINI -- --    The CrCl is unknown because both a height and weight (above a minimum accepted value) are required for this calculation.   Medical History: Past Medical History  Diagnosis Date  . Morbid obesity     Status post lap band surgery  . Hx of thyroid cancer     Dr Evlyn Kanner  . Colon polyp     Dr Kinnie Scales  . Dyslipidemia   . Hyperlipidemia   . Hypothyroidism   . Breast cancer     Dr Jamey Ripa, total thyroidectomy- 1999- for cancer  . Pneumonia     2010- hosp. here [facility]- during that event had to be cardioverted   . Arthritis     osteoarthritis - knees   . Normal cardiac stress test     2008- cardiac stress/echo  . Stroke     2003- [place]   . Atrial fibrillation     ablation- 2x's-- 1st time- [facility], 2nd event at [facility] in 2008. Dr. Graciela Husbands- [place]- sees pt. once /yr. ,  . Hepatitis     [place] ?hepatitis   . Complication of anesthesia     Ketamine produces LSD reaction, bright colored nightmarish experience    Home Coumadin dose:  2.5 mg po daily except 5 mg on Mon, Wed, Fri  Assessment: 66 yo F admitted for rt TKA and to continue chronic  anticoagulation post-op.  Dr. Graciela Husbands had put her on bridge therapy with Lovenox until surgery and to continue after surgery with Coumadin until INR therapeutic. Coumadin restarted 7/22.  INR 1.19 today.  Full dose Lovenox was restarted today (7/23). H/H did drop post surgery, but no signs of active bleeding noted.  Goal of Therapy:  INR 2-3 Monitor platelets by anticoagulation protocol: Yes   Plan:  1.  Coumadin 5 mg po x 1 tonight 2.  Daily PT/INR 3.  Lovenox 100 mg SQ q12hr 4.  CBC q72hr while on Lovenox   Vania Rea. Darin Engels.D. Clinical Pharmacist Pager [phone] Phone [phone] 04/14/2012 9:30 AM

## 2012-04-14 NOTE — Anesthesia Postprocedure Evaluation (Signed)
  Anesthesia Post-op Note  Patient: Kayla Harrison  Procedure(s) Performed: Procedure(s) (LRB): TOTAL KNEE ARTHROPLASTY (Right)  Patient Location: PACU  Anesthesia Type: GA combined with regional for post-op pain  Level of Consciousness: awake  Airway and Oxygen Therapy: Patient Spontanous Breathing  Post-op Pain: mild  Post-op Assessment: Post-op Vital signs reviewed, Patient's Cardiovascular Status Stable, Respiratory Function Stable, Patent Airway, No signs of Nausea or vomiting and Pain level controlled  Post-op Vital Signs: stable  Complications: No apparent anesthesia complications

## 2012-04-14 NOTE — Evaluation (Signed)
Physical Therapy Evaluation Patient Details Name: Kayla Harrison MRN: [id number] DOB: 1946/09/16 Today's Date: 04/14/2012 Time: 1308-6578 PT Time Calculation (min): 28 min  PT Assessment / Plan / Recommendation Clinical Impression  Pt is 66 y/o female admitted for s/p right TKA.  Pt very motivated and willing to work.  Pt will benefit from acute PT services to improve overall mobility.  Will continue to follow.    PT Assessment  Patient needs continued PT services    Follow Up Recommendations  Home health PT    Barriers to Discharge        Equipment Recommendations  None recommended by PT    Recommendations for Other Services     Frequency 7X/week    Precautions / Restrictions Precautions Precautions: Knee Restrictions Weight Bearing Restrictions: Yes RLE Weight Bearing: Weight bearing as tolerated   Pertinent Vitals/Pain 5/10 right knee      Mobility  Bed Mobility Bed Mobility: Supine to Sit;Sitting - Scoot to Edge of Bed Supine to Sit: 4: Min assist;With rails Sitting - Scoot to [place] of Bed: 4: Min assist;With rail Details for Bed Mobility Assistance: (A) with right LE OOB with cues for proper technique Transfers Transfers: Sit to Stand;Stand to Sit Sit to Stand: 4: Min assist;From bed Stand to Sit: 4: Min assist;To chair/3-in-1 Details for Transfer Assistance: (A) to initiate transfer and slowly descend to recliner with cues for hand placement and right LE placement during transfers; Ambulation/Gait Ambulation/Gait Assistance: 4: Min guard Ambulation Distance (Feet): 80 Feet Assistive device: Rolling walker Ambulation/Gait Assistance Details: Minguard for safety with cues for step sequence and proper RW placement Gait Pattern: Step-to pattern;Shuffle;Trunk flexed;Antalgic Stairs: No    Exercises Total Joint Exercises Ankle Circles/Pumps: Right;10 reps Quad Sets: Right;5 reps;Strengthening Heel Slides: Right;5 reps;AAROM   PT Diagnosis: Difficulty  walking;Abnormality of gait;Generalized weakness;Acute pain  PT Problem List: Decreased strength;Decreased range of motion;Decreased activity tolerance;Decreased knowledge of use of DME;Pain PT Treatment Interventions:     PT Goals Acute Rehab PT Goals PT Goal Formulation: With patient Time For Goal Achievement: 04/21/12 Potential to Achieve Goals: Good  Visit Information  Last PT Received On: 04/14/12 Assistance Needed: +1    Subjective Data  Subjective: "I'm ready to get up and move." Patient Stated Goal: To go home   Prior Functioning  Home Living Lives With: Friend(s) ([place] to assist pt) Available Help at Discharge: Family;Friend(s) Type of Home:  (condo) Home Access: Level entry Home Layout: One level Bathroom Shower/Tub: Walk-in Contractor: Handicapped height Bathroom Accessibility: Yes How Accessible: Accessible via walker Home Adaptive Equipment: Bedside commode/3-in-1;Walker - rolling;Straight cane;Hand-held shower hose Prior Function Level of Independence: Independent Able to Take Stairs?: No Vocation: Retired Musician: No difficulties Dominant Hand: Right    Cognition  Overall Cognitive Status: Appears within functional limits for tasks assessed/performed Arousal/Alertness: Awake/alert Orientation Level: Appears intact for tasks assessed Behavior During Session: [facility] for tasks performed    Extremity/Trunk Assessment Right Lower Extremity Assessment RLE ROM/Strength/Tone: Deficits;Unable to fully assess;Due to pain RLE ROM/Strength/Tone Deficits: AAROM knee flexion 0-50 Left Lower Extremity Assessment LLE ROM/Strength/Tone: Within functional levels   Balance    End of Session PT - End of Session Equipment Utilized During Treatment: Gait belt Activity Tolerance: Patient tolerated treatment well Patient left: in chair;with call bell/phone within reach Nurse Communication: Mobility status  GP      Kayla Harrison 04/14/2012, 11:45 AM Jake Shark, PT DPT [phone]

## 2012-04-14 NOTE — Progress Notes (Signed)
Kayla Spurling, MD   Altamese Cabal, PA-C [address] [place], [place], [place]  [address]                             [phone]   PROGRESS NOTE  Subjective:  negative for Chest Pain  negative for Shortness of Breath  negative for Nausea/Vomiting   negative for Calf Pain  negative for Bowel Movement   Tolerating Diet: yes         Patient reports pain as 5 on 0-10 scale.    Objective: Vital signs in last 24 hours:   Patient Vitals for the past 24 hrs:  BP Temp Temp src Pulse Resp SpO2  04/14/12 0556 109/45 mmHg 97.8 F (36.6 C) - 68  19  98 %  04/14/12 0400 - - - - 18  97 %  04/14/12 0230 119/54 mmHg 97.8 F (36.6 C) - 69  18  97 %  04/14/12 0000 - - - - 18  97 %  04/13/12 2148 153/77 mmHg 98.4 F (36.9 C) Oral 83  18  97 %  04/13/12 2000 - - - - 18  95 %  04/13/12 1818 122/70 mmHg 97.9 F (36.6 C) - 62  18  96 %  04/13/12 1327 135/63 mmHg 97.4 F (36.3 C) - 59  16  95 %  04/13/12 1245 - 97.5 F (36.4 C) - 58  15  96 %  04/13/12 1241 142/60 mmHg - - 59  10  99 %  04/13/12 1230 - - - 88  17  96 %  04/13/12 1226 141/70 mmHg - - 59  15  96 %  04/13/12 1215 - - - 60  15  94 %  04/13/12 1211 137/56 mmHg - - 60  15  97 %  04/13/12 1200 - - - 60  14  97 %  04/13/12 1156 136/69 mmHg - - 59  18  97 %  04/13/12 1146 131/68 mmHg 97.6 F (36.4 C) - - - -    @flow {1959:LAST@   Intake/Output from previous day:   07/22 0701 - 07/23 0700 In: 2190 [P.O.:240; I.V.:1950] Out: 1875 [Urine:1400; Drains:225]   Intake/Output this shift:       Intake/Output      07/22 0701 - 07/23 0700 07/23 0701 - 07/24 0700   P.O. 240    I.V. 1950    Total Intake 2190    Urine 1400    Drains 225    Blood 250    Total Output 1875    Net +315         Emesis Occurrence 1 x       LABORATORY DATA:  Basename 04/14/12 0645 04/10/12 1026  WBC 12.3* 5.6  HGB 9.6* 12.8  HCT 29.4* 39.9  PLT 144* 189    Basename 04/14/12 0645 04/10/12 1026  NA 138 142  K 4.5 4.2  CL 103 104  CO2 28  28  BUN 17 22  CREATININE 0.86 1.01  GLUCOSE 131* 87  CALCIUM 8.4 9.5   Lab Results  Component Value Date   INR 1.19 04/14/2012   INR 1.01 04/13/2012   INR 1.84* 04/10/2012   PROTIME 20.1 03/02/2009    Examination:  General appearance: alert, cooperative and no distress Extremities: Homans sign is negative, no sign of DVT  Wound Exam: clean, dry, intact   Drainage:  Scant/small amount Serosanguinous exudate  Motor Exam: EHL and FHL  Intact  Sensory Exam: Deep Peroneal normal  Vascular Exam:    Assessment:    1 Day Post-Op  Procedure(s) (LRB): TOTAL KNEE ARTHROPLASTY (Right)  ADDITIONAL DIAGNOSIS:  Active Problems:  * No active hospital problems. *   Acute Blood Loss Anemia   Plan: Physical Therapy as ordered Weight Bearing as Tolerated (WBAT)  DVT Prophylaxis:  Lovenox and Coumadin  DISCHARGE PLAN: Home  DISCHARGE NEEDS: HHPT, HHRN, CPM, Walker and 3-in-1 comode seat         [name] 04/14/2012, 8:11 AM

## 2012-04-15 LAB — BASIC METABOLIC PANEL
BUN: 22 mg/dL (ref 6–23)
CO2: 27 mEq/L (ref 19–32)
Chloride: 103 mEq/L (ref 96–112)
Creatinine, Ser: 0.99 mg/dL (ref 0.50–1.10)
Potassium: 3.6 mEq/L (ref 3.5–5.1)

## 2012-04-15 LAB — CBC
HCT: 28.6 % — ABNORMAL LOW (ref 36.0–46.0)
MCV: 93.5 fL (ref 78.0–100.0)
Platelets: 135 10*3/uL — ABNORMAL LOW (ref 150–400)
RBC: 3.06 MIL/uL — ABNORMAL LOW (ref 3.87–5.11)
WBC: 9.1 10*3/uL (ref 4.0–10.5)

## 2012-04-15 MED ORDER — METHOCARBAMOL 500 MG PO TABS: 500.0000 mg | ORAL_TABLET | Freq: Four times a day (QID) | ORAL | Status: AC | PRN

## 2012-04-15 MED ORDER — ENOXAPARIN SODIUM 100 MG/ML ~~LOC~~ SOLN: 100.0000 mg | Freq: Two times a day (BID) | SUBCUTANEOUS | Status: DC

## 2012-04-15 MED ORDER — WARFARIN SODIUM 2.5 MG PO TABS
2.5000 mg | ORAL_TABLET | Freq: Once | ORAL | Status: DC
  Filled 2012-04-15: qty 1

## 2012-04-15 MED ORDER — OXYCODONE HCL 10 MG PO TB12: 10.0000 mg | ORAL_TABLET | Freq: Two times a day (BID) | ORAL | Status: DC

## 2012-04-15 MED ORDER — OXYCODONE HCL 5 MG PO TABS: 5.0000 mg | ORAL_TABLET | ORAL | Status: AC | PRN

## 2012-04-15 NOTE — Progress Notes (Signed)
  [name], MD   Altamese Cabal, PA-C [address] [place], [place], [place]  [address]                             [phone]   PROGRESS NOTE  Subjective:  negative for Chest Pain  negative for Shortness of Breath  negative for Nausea/Vomiting   negative for Calf Pain  negative for Bowel Movement   Tolerating Diet: yes         Patient reports pain as 6 on 0-10 scale.    Objective: Vital signs in last 24 hours:   Patient Vitals for the past 24 hrs:  BP Temp Temp src Pulse Resp SpO2  04/15/12 0554 110/58 mmHg 98.3 F (36.8 C) - 74  19  97 %  04/15/12 0000 - - - - 16  -  04/14/12 2200 112/42 mmHg 98.3 F (36.8 C) - 77  19  92 %  04/14/12 2000 - - - - 16  -  04/14/12 1306 120/45 mmHg 98.1 F (36.7 C) Oral 74  18  94 %    @flow {1959:LAST@   Intake/Output from previous day:   07/23 0701 - 07/24 0700 In: 360 [P.O.:360] Out: 575 [Urine:550; Drains:25]   Intake/Output this shift:   07/24 0701 - 07/24 1900 In: 480 [P.O.:480] Out: -    Intake/Output      07/23 0701 - 07/24 0700 07/24 0701 - 07/25 0700   P.O. 360 480   I.V.     Total Intake 360 480   Urine 550    Drains 25    Blood     Total Output 575    Net -215 +480        Urine Occurrence 3 x 1 x      LABORATORY DATA:  Basename 04/15/12 0640 04/14/12 0645 04/10/12 1026  WBC 9.1 12.3* 5.6  HGB 9.2* 9.6* 12.8  HCT 28.6* 29.4* 39.9  PLT 135* 144* 189    Basename 04/15/12 0640 04/14/12 0645 04/10/12 1026  NA 139 138 142  K 3.6 4.5 4.2  CL 103 103 104  CO2 27 28 28   BUN 22 17 22   CREATININE 0.99 0.86 1.01  GLUCOSE 121* 131* 87  CALCIUM 8.3* 8.4 9.5   Lab Results  Component Value Date   INR 1.54* 04/15/2012   INR 1.19 04/14/2012   INR 1.01 04/13/2012   PROTIME 20.1 03/02/2009    Examination:  General appearance: alert, cooperative and no distress Extremities: Homans sign is negative, no sign of DVT  Wound Exam: clean, dry, intact   Drainage:  None: wound tissue dry  Motor Exam: EHL and  FHL Intact  Sensory Exam: Deep Peroneal normal  Vascular Exam:    Assessment:    2 Days Post-Op  Procedure(s) (LRB): TOTAL KNEE ARTHROPLASTY (Right)  ADDITIONAL DIAGNOSIS:  Active Problems:  * No active hospital problems. *   Acute Blood Loss Anemia   Plan: Physical Therapy as ordered Weight Bearing as Tolerated (WBAT)  DVT Prophylaxis:  Lovenox and Coumadin  DISCHARGE PLAN: Home  DISCHARGE NEEDS: HHPT, HHRN, CPM, Walker and 3-in-1 comode seat         Kayla Harrison 04/15/2012, 12:06 PM

## 2012-04-15 NOTE — Progress Notes (Signed)
Physical Therapy Progress Note  04/15/12 1300  PT General Charges  $$ ACUTE PT VISIT 1 Procedure  PT Treatments  $Gait Training 8-22 mins  $Therapeutic Activity 8-22 mins    Camino Tass[place], [place] DPT [phone]

## 2012-04-15 NOTE — Progress Notes (Signed)
Physical Therapy Treatment Patient Details Name: Kayla Harrison MRN: [id number] DOB: 19-Oct-1945 Today's Date: 04/15/2012 Time: 4098-1191 PT Time Calculation (min): 28 min  PT Assessment / Plan / Recommendation Comments on Treatment Session  Pt very limited due to pain this session.  Pt did not want to increase ambulation distance and wanted to return to bed due to pain.  Pt left in CPM 0-80 degrees.  Will attempt to increase ambulation next session.      Follow Up Recommendations  Home health PT    Barriers to Discharge        Equipment Recommendations  None recommended by OT    Recommendations for Other Services    Frequency 7X/week   Plan Discharge plan remains appropriate;Frequency remains appropriate    Precautions / Restrictions Precautions Precautions: Knee Restrictions Weight Bearing Restrictions: Yes RLE Weight Bearing: Weight bearing as tolerated   Pertinent Vitals/Pain 8/10 left knee pain    Mobility  Bed Mobility Bed Mobility: Sit to Supine Supine to Sit: 5: Supervision;HOB flat Sitting - Scoot to Edge of Bed: 5: Supervision Sit to Supine: 4: Min assist Details for Bed Mobility Assistance: (A) with left LE into bed with cues for technique. Transfers Transfers: Sit to Stand;Stand to Sit Sit to Stand: 4: Min guard;From chair/3-in-1 Stand to Sit: 4: Min guard;To bed Details for Transfer Assistance: Minguard for safety with cues for hand placement and left LE placement Ambulation/Gait Ambulation/Gait Assistance: 4: Min guard Ambulation Distance (Feet): 20 Feet Assistive device: Rolling walker Ambulation/Gait Assistance Details: Minguard for safety with cues for proper RW placemeent Gait Pattern: Step-to pattern;Decreased stride length;Antalgic    Exercises Total Joint Exercises Ankle Circles/Pumps: Right;10 reps Quad Sets: AAROM;Strengthening;Right;10 reps Heel Slides: AAROM;Right;10 reps   PT Diagnosis:    PT Problem List:   PT Treatment  Interventions:     PT Goals Acute Rehab PT Goals PT Goal Formulation: With patient Time For Goal Achievement: 04/21/12 Potential to Achieve Goals: Good Pt will go Sit to Supine/Side: with modified independence PT Goal: Sit to Supine/Side - Progress: Progressing toward goal Pt will go Sit to Stand: with modified independence PT Goal: Sit to Stand - Progress: Progressing toward goal Pt will go Stand to Sit: with modified independence PT Goal: Stand to Sit - Progress: Progressing toward goal Pt will Perform Home Exercise Program: Independently PT Goal: Perform Home Exercise Program - Progress: Progressing toward goal  Visit Information  Last PT Received On: 04/15/12 Assistance Needed: +1    Subjective Data  Subjective: "I'm not feeling as good today." Patient Stated Goal: To go home   Cognition  Overall Cognitive Status: Appears within functional limits for tasks assessed/performed Arousal/Alertness: Awake/alert Orientation Level: Appears intact for tasks assessed Behavior During Session: [facility] for tasks performed    Balance     End of Session PT - End of Session Equipment Utilized During Treatment: Gait belt Activity Tolerance: Patient tolerated treatment well Patient left: in bed;in CPM;with call bell/phone within reach Nurse Communication: Mobility status CPM Right Knee CPM Right Knee: Off   GP     Giliana Vantil 04/15/2012, 12:50 PM Jake Shark, PT DPT [phone]

## 2012-04-15 NOTE — Discharge Summary (Signed)
[name], MD   Altamese Cabal, PA-C [address] [place], [place], [place]  [address]                             [phone]  PATIENT ID: Zaydah Nawabi        MRN:  [id number]          DOB/AGE: 68-Jan-1947 / 68 y.o.    DISCHARGE SUMMARY  ADMISSION DATE:    04/13/2012 DISCHARGE DATE:   04/15/2012   ADMISSION DIAGNOSIS: osteoarthritis right knee    DISCHARGE DIAGNOSIS:  osteoarthritis right knee    ADDITIONAL DIAGNOSIS: Active Problems:  * No active hospital problems. *   Past Medical History  Diagnosis Date  . Morbid obesity     Status post lap band surgery  . Hx of thyroid cancer     Dr Evlyn Kanner  . Colon polyp     Dr Kinnie Scales  . Dyslipidemia   . Hyperlipidemia   . Hypothyroidism   . Breast cancer     Dr Jamey Ripa, total thyroidectomy- 1999- for cancer  . Pneumonia     2010- hosp. here [facility]- during that event had to be cardioverted   . Arthritis     osteoarthritis - knees   . Normal cardiac stress test     2008- cardiac stress/echo  . Stroke     2003- [place]   . Atrial fibrillation     ablation- 2x's-- 1st time- [facility], 2nd event at [facility] in 2008. Dr. Graciela Husbands- [facility]- sees pt. once /yr. ,  . Hepatitis     [place]patitis   . Complication of anesthesia     Ketamine produces LSD reaction, bright colored nightmarish experience     PROCEDURE: Procedure(s): TOTAL KNEE ARTHROPLASTY on 04/13/2012  CONSULTS:     HISTORY:  See H&P in chart  HOSPITAL COURSE:  Kendyll Huettner is a 66 y.o. admitted on 04/13/2012 and found to have a diagnosis of osteoarthritis right knee.  After appropriate laboratory studies were obtained  they were taken to the operating room on 04/13/2012 and underwent Procedure(s): TOTAL KNEE ARTHROPLASTY.   They were given perioperative antibiotics:  Anti-infectives     Start     Dose/Rate Route Frequency Ordered Stop   04/13/12 1445   ceFAZolin (ANCEF) IVPB 2 g/50 mL premix        2 g 100 mL/hr  over 30 Minutes Intravenous Every 6 hours 04/13/12 1318 04/13/12 2235   04/12/12 1206   ceFAZolin (ANCEF) IVPB 2 g/50 mL premix        2 g 100 mL/hr over 30 Minutes Intravenous 60 min pre-op 04/12/12 1206 04/13/12 0845        .  Tolerated the procedure well.  Placed with a foley intraoperatively.  Given Ofirmev at induction and for 48 hours.    POD #1, allowed out of bed to a chair.  PT for ambulation and exercise program.  Foley D/C'd in morning.  IV saline locked.  O2 discontionued.  POD #2, continued PT and ambulation.   Hemovac pulled. .  The remainder of the hospital course was dedicated to ambulation and strengthening.   The patient was discharged on 2 Days Post-Op in  Good condition.  Blood products given:none  DIAGNOSTIC STUDIES: Recent vital signs: Patient Vitals for the past 24 hrs:  BP Temp Temp src Pulse Resp SpO2  04/15/12  0554 110/58 mmHg 98.3 F (36.8 C) - 74  19  97 %  04/15/12 0000 - - - - 16  -  05/12/12 2200 112/42 mmHg 98.3 F (36.8 C) - 77  19  92 %  12-May-2012 2000 - - - - 16  -  05-12-12 1306 120/45 mmHg 98.1 F (36.7 C) Oral 74  18  94 %       Recent laboratory studies:  Basename 04/15/12 0640 12-May-2012 0645 04/10/12 1026  WBC 9.1 12.3* 5.6  HGB 9.2* 9.6* 12.8  HCT 28.6* 29.4* 39.9  PLT 135* 144* 189    Basename 04/15/12 0640 2012/05/12 0645 04/10/12 1026  NA 139 138 142  K 3.6 4.5 4.2  CL 103 103 104  CO2 27 28 28   BUN 22 17 22   CREATININE 0.99 0.86 1.01  GLUCOSE 121* 131* 87  CALCIUM 8.3* 8.4 9.5   Lab Results  Component Value Date   INR 1.54* 04/15/2012   INR 1.19 May 12, 2012   INR 1.01 04/13/2012   PROTIME 20.1 03/02/2009     Recent Radiographic Studies :  Dg Chest 2 View  04/10/2012  *RADIOLOGY REPORT*  Clinical Data: Preoperative respiratory exam for total knee arthroplasty.  CHEST - 2 VIEW  Comparison: 06/18/2010  Findings: Heart size is normal.  Mediastinal shadows are normal. The lungs are clear.  No effusions.  No significant bony  finding.  IMPRESSION: Normal chest  Original Report Authenticated By: Thomasenia Sales, M.D.    DISCHARGE INSTRUCTIONS: Discharge Orders    Future Appointments: Provider: Department: Dept Phone: Center:   04/17/2012 11:30 AM [facility] [phone] None     Future Orders Please Complete By Expires   Diet - low sodium heart healthy      Call MD / Call 911      Comments:   If you experience chest pain or shortness of breath, CALL 911 and be transported to the hospital emergency room.  If you develope a fever above 101 F, pus (white drainage) or increased drainage or redness at the wound, or calf pain, call your surgeon's office.   Constipation Prevention      Comments:   Drink plenty of fluids.  Prune juice may be helpful.  You may use a stool softener, such as Colace (over the counter) 100 mg twice a day.  Use MiraLax (over the counter) for constipation as needed.   Increase activity slowly as tolerated      Driving restrictions      Comments:   No driving for 6 weeks   Lifting restrictions      Comments:   No lifting for 6 weeks   CPM      Comments:   Continuous passive motion machine (CPM):      Use the CPM from 0 to 90 for 6-8 hours per day.      You may increase by 10 per day.  You may break it up into 2 or 3 sessions per day.      Use CPM for 2 weeks or until you are told to stop.   TED hose      Comments:   Use stockings (TED hose) for 3 weeks on both leg(s).  You may remove them at night for sleeping.   Change dressing      Comments:   Change dressing on thursday, then change the dressing daily with sterile 4 x 4 inch gauze dressing and apply TED hose.  You  may clean the incision with alcohol prior to redressing.   Do not put a pillow under the knee. Place it under the heel.         DISCHARGE MEDICATIONS:   Medication List  As of 04/15/2012 12:20 PM   STOP taking these medications         Fish Oil 1000 MG Caps         TAKE these  medications         amiodarone 200 MG tablet   Commonly known as: PACERONE   Take 200 mg by mouth See admin instructions. mon-fri only doesn't take on Saturday or sunday      CALCIUM CITRATE + PO   Take 1 tablet by mouth daily.      celecoxib 200 MG capsule   Commonly known as: CELEBREX   Take 200 mg by mouth daily.      enoxaparin 100 MG/ML injection   Commonly known as: LOVENOX   Inject 1 mL (100 mg total) into the skin every 12 (twelve) hours.      furosemide 40 MG tablet   Commonly known as: LASIX   Take 40 mg by mouth daily as needed. For fluid      levothyroxine 175 MCG tablet   Commonly known as: SYNTHROID, LEVOTHROID   Take 175 mcg by mouth See admin instructions. Take 6 days a week. Doesn't take on Saturday      methocarbamol 500 MG tablet   Commonly known as: ROBAXIN   Take 1 tablet (500 mg total) by mouth every 6 (six) hours as needed.      multivitamins ther. w/minerals Tabs   Take 1 tablet by mouth daily.      oxyCODONE 5 MG immediate release tablet   Commonly known as: Oxy IR/ROXICODONE   Take 1-2 tablets (5-10 mg total) by mouth every 4 (four) hours as needed for pain.      oxyCODONE 10 MG 12 hr tablet   Commonly known as: OXYCONTIN   Take 1 tablet (10 mg total) by mouth every 12 (twelve) hours.      pravastatin 20 MG tablet   Commonly known as: PRAVACHOL   Take 1 tablet (20 mg total) by mouth daily. Take 1 tablet at bedtime.      TYLENOL PM EXTRA STRENGTH PO   Take 1-2 tablets by mouth 2 (two) times daily as needed. For sleep or pain      Vitamin D3 1000 UNITS Caps   Take 1 capsule by mouth daily.      warfarin 5 MG tablet   Commonly known as: COUMADIN   Take 2.5-5 mg by mouth daily. 5 mg on mon, wed, and fri and takes 2.5 mg on all other days            FOLLOW UP VISIT:   Follow-up Information    Follow up with Raymon Mutton, MD. Call on 04/28/2012.   Contact information:   [address] [place]  [address] [phone]          DISPOSITION:  Home    CONDITION:  Good   Meiya Wisler 04/15/2012, 12:20 PM

## 2012-04-15 NOTE — Op Note (Signed)
TOTAL KNEE REPLACEMENT OPERATIVE NOTE:  04/13/2012  12:54 PM  PATIENT:  Kayla Harrison  66 y.o. female  PRE-OPERATIVE DIAGNOSIS:  osteoarthritis right knee  POST-OPERATIVE DIAGNOSIS:  osteoarthritis right knee  PROCEDURE:  Procedure(s): TOTAL KNEE ARTHROPLASTY  SURGEON:  Surgeon(s): Raymon Mutton, MD  PHYSICIAN ASSISTANT: Altamese Cabal, [facility]  ANESTHESIA:   general  DRAINS: Hemovac and On-Q Marcaine Pain Pump  SPECIMEN: None  COUNTS:  Correct  TOURNIQUET:   Total Tourniquet Time Documented: Thigh (Right) - 92 minutes  DICTATION:  Indication for procedure:    The patient is a 66 y.o. female who has failed conservative treatment for osteoarthritis right knee.  Informed consent was obtained prior to anesthesia. The risks versus benefits of the operation were explain and in a way the patient can, and did, understand.   Description of procedure:     The patient was taken to the operating room and placed under anesthesia.  The patient was positioned in the usual fashion taking care that all body parts were adequately padded and/or protected.  I foley catheter was placed.  A tourniquet was applied and the leg prepped and draped in the usual sterile fashion.  The extremity was exsanguinated with the esmarch and tourniquet inflated to 350 mmHg.  Pre-operative range of motion was normal.  The knee was in 5 degree of mild varus.  A midline incision approximately 6-7 inches long was made with a #10 blade.  A new blade was used to make a parapatellar arthrotomy going 2-3 cm into the quadriceps tendon, over the patella, and alongside the medial aspect of the patellar tendon.  A synovectomy was then performed with the #10 blade and forceps. I then elevated the deep MCL off the medial tibial metaphysis subperiosteally around to the semimembranosus attachment.    I everted the patella and used calipers to measure patellar thickness.  I used the reamer to ream down to appropriate  thickness to recreate the native thickness.  I then removed excess bone with the rongeur and sagittal saw.  I used the appropriately sized template and drilled the three lug holes.  I then put the trial in place and measured the thickness with the calipers to ensure recreation of the native thickness.  The trial was then removed and the patella subluxed and the knee brought into flexion.  A homan retractor was place to retract and protect the patella and lateral structures.  A Z-retractor was place medially to protect the medial structures.  The extra-medullary alignment system was used to make cut the tibial articular surface perpendicular to the anamotic axis of the tibia and in 3 degrees of posterior slope.  The cut surface and alignment jig was removed.  I then used the intramedullary alignment guide to make a 6 valgus cut on the distal femur.  I then marked out the epicondylar axis on the distal femur.  The posterior condylar axis measured 3 degrees.  I then used the anterior referencing sizer and measured the femur to be a size E.  The 4-In-1 cutting block was screwed into place in external rotation matching the posterior condylar angle, making our cuts perpendicular to the epicondylar axis.  Anterior, posterior and chamfer cuts were made with the sagittal saw.  The cutting block and cut pieces were removed.  A lamina spreader was placed in 90 degrees of flexion.  The ACL, PCL, menisci, and posterior condylar osteophytes were removed.  A 12 mm spacer blocked was found to offer good  flexion and extension gap balance after minimal in degree releasing.   The scoop retractor was then placed and the femoral finishing block was pinned in place.  The small sagittal saw was used as well as the lug drill to finish the femur.  The block and cut surfaces were removed and the medullary canal hole filled with autograft bone from the cut pieces.  The tibia was delivered forward in deep flexion and external rotation.   A size 4 tray was selected and pinned into place centered on the medial 1/3 of the tibial tubercle.  The reamer and keel was used to prepare the tibia through the tray.    I then trialed with the size E femur, size 4 tibia, a 12 mm insert and the 32 patella.  I had excellent flexion/extension gap balance, excellent patella tracking.  Flexion was full and beyond 120 degrees; extension was zero.  These components were chosen and the staff opened them to me on the back table while the knee was lavaged copiously and the cement mixed.  I cemented in the components and removed all excess cement.  The polyethylene tibial component was snapped into place and the knee placed in extension while cement was hardening.  The capsule was infilltrated with 20cc of .25% Marcaine with epinephrine.  A hemovac was place in the joint exiting superolaterally.  A pain pump was place superomedially superficial to the arthrotomy.  Once the cement was hard, the tourniquet was let down.  Hemostasis was obtained.  The arthrotomy was closed with figure-8 #1 vicryl sutures.  The deep soft tissues were closed with #0 vicryls and the subcuticular layer closed with a running #2-0 vicryl.  The skin was reapproximated and closed with skin staples.  The wound was dressed with xeroform, 4 x4's, 2 ABD sponges, a single layer of webril and a TED stocking.   The patient was then awakened, extubated, and taken to the recovery room in stable condition.  BLOOD LOSS:  300cc DRAINS: 1 hemovac, 1 pain catheter COMPLICATIONS:  None.  PLAN OF CARE: Admit to inpatient   PATIENT DISPOSITION:  PACU - hemodynamically stable.   Delay start of Pharmacological VTE agent (>24hrs) due to surgical blood loss or risk of bleeding:  not applicable  Please fax a copy of this op note to my office at [phone] (please only include page 1 and 2 of the Case Information op note)

## 2012-04-15 NOTE — Progress Notes (Signed)
ANTICOAGULATION CONSULT NOTE - Initial Consult  Pharmacy Consult for Coumadin, Lovenox Indication: atrial fibrillation, hx CVA, VTE prophylaxis  Allergies  Allergen Reactions  . Xarelto (Rivaroxaban)     Nose Bleed X 6 hrs ; packing in ER  . Epinephrine Other (See Comments)    Dental form only (liquid). Patient stated she will become "out of it" she can hear you but cannot respond in normal fashion.    Patient Measurements:   Wt=107kg  Vital Signs: Temp: 98.3 F (36.8 C) (07/24 0554) BP: 110/58 mmHg (07/24 0554) Pulse Rate: 74  (07/24 0554)  Labs:  Alvira Philips 04/15/12 0640 04/14/12 0645 04/13/12 0618  HGB 9.2* 9.6* --  HCT 28.6* 29.4* --  PLT 135* 144* --  APTT -- -- 36  LABPROT 18.8* 15.4* 13.5  INR 1.54* 1.19 1.01  HEPARINUNFRC -- -- --  CREATININE 0.99 0.86 --  CKTOTAL -- -- --  CKMB -- -- --  TROPONINI -- -- --    The CrCl is unknown because both a height and weight (above a minimum accepted value) are required for this calculation.   Medical History: Past Medical History  Diagnosis Date  . Morbid obesity     Status post lap band surgery  . Hx of thyroid cancer     Dr Evlyn Kanner  . Colon polyp     Dr Kinnie Scales  . Dyslipidemia   . Hyperlipidemia   . Hypothyroidism   . Breast cancer     Dr Jamey Ripa, total thyroidectomy- 1999- for cancer  . Pneumonia     2010- hosp. here [facility]- during that event had to be cardioverted   . Arthritis     osteoarthritis - knees   . Normal cardiac stress test     2008- cardiac stress/echo  . Stroke     2003- [place]   . Atrial fibrillation     ablation- 2x's-- 1st time- [facility], 2nd event at [facility] in 2008. Dr. Graciela Husbands- [place]- sees pt. once /yr. ,  . Hepatitis     [place] ?hepatitis   . Complication of anesthesia     Ketamine produces LSD reaction, bright colored nightmarish experience    Home Coumadin dose:  2.5 mg po daily except 5 mg on Mon, Wed, Fri  Assessment: 66 yo F admitted for  rt TKA and to continue chronic anticoagulation post-op.  Dr. Graciela Husbands had put her on bridge therapy with Lovenox until surgery and to continue after surgery with Coumadin until INR therapeutic. Coumadin restarted 7/22.  INR 1.54 today.  Full dose Lovenox has been restarted due to high risk of stroke (7/23). H/H dropping post surgery, but no signs of active bleeding noted.   Goal of Therapy:  INR 2-3 Monitor platelets by anticoagulation protocol: Yes    Plan:  1.  Coumadin 2.5 mg po x 1 tonight 2.  Daily PT/INR 3.  Cont. Lovenox 100 mg SQ q12hr 4.  CBC q72hr while on Lovenox    Doris Cheadle, PharmD Clinical Pharmacist Pager: [phone] Phone: [phone] 04/15/2012 9:14 AM

## 2012-04-15 NOTE — Evaluation (Signed)
Occupational Therapy Evaluation Patient Details Name: Kayla Harrison MRN: [id number] DOB: [birth year] Today's Date: 04/15/2012 Time: 7829-5621 OT Time Calculation (min): 24 min  OT Assessment / Plan / Recommendation Clinical Impression  Pt presents with RTKR POD 2. Skilled OT recommended to maximize I w/BADLs to supervision level in prep for d/c home with HHOT.    OT Assessment  Patient needs continued OT Services    Follow Up Recommendations  Home health OT    Barriers to Discharge      Equipment Recommendations  None recommended by OT    Recommendations for Other Services    Frequency  Min 2X/week    Precautions / Restrictions Precautions Precautions: Knee Restrictions Weight Bearing Restrictions: No RLE Weight Bearing: Weight bearing as tolerated   Pertinent Vitals/Pain Reported 8/10 pain during session. Pt was premedicated and repositioned.    ADL  Grooming: Simulated;Set up Where Assessed - Grooming: Unsupported sitting Upper Body Bathing: Simulated;Set up Where Assessed - Upper Body Bathing: Unsupported sitting Lower Body Bathing: Simulated;Minimal assistance Where Assessed - Lower Body Bathing: Supported sit to stand Upper Body Dressing: Simulated;Set up Where Assessed - Upper Body Dressing: Unsupported sitting Lower Body Dressing: Performed;Minimal assistance (A needed to thread pant legs around operated leg.) Where Assessed - Lower Body Dressing: Supported sit to stand Toilet Transfer: Counsellor Method: Sit to Barista: Other (comment) (to recliner.) Toileting - Clothing Manipulation and Hygiene: Simulated;Min guard Where Assessed - Engineer, mining and Hygiene: Standing Tub/Shower Transfer: Simulated;Minimal assistance Tub/Shower Transfer Method: Ambulating Transfers/Ambulation Related to ADLs: Ambulated around bed to chair with minguard A and min VCs for step sequence, safety manipulating  RW. ADL Comments: Pt limited by significant pain this am. Pt stated meds made her feel groggy. Pt with 1 LOB when practicing simulated shower transfer which she was able to self correct. Pt stated she would likely spongebathe until feeling steadier. Pt was able to complete LB ADLs with very minimal A.    OT Diagnosis: Generalized weakness  OT Problem List: Decreased activity tolerance;Decreased safety awareness;Decreased knowledge of use of DME or AE;Pain OT Treatment Interventions: Self-care/ADL training;Therapeutic activities;DME and/or AE instruction;Patient/family education   OT Goals Acute Rehab OT Goals OT Goal Formulation: With patient Time For Goal Achievement: 04/22/12 Potential to Achieve Goals: Good ADL Goals Pt Will Perform Grooming: with supervision;Standing at sink ADL Goal: Grooming - Progress: Goal set today Pt Will Perform Lower Body Bathing: with supervision;Sit to stand from chair;Sit to stand from bed ADL Goal: Lower Body Bathing - Progress: Goal set today Pt Will Perform Lower Body Dressing: with supervision;Sit to stand from bed;Sit to stand from chair ADL Goal: Lower Body Dressing - Progress: Goal set today Pt Will Transfer to Toilet: with supervision;3-in-1;Comfort height toilet;Ambulation ADL Goal: Toilet Transfer - Progress: Goal set today Pt Will Perform Toileting - Clothing Manipulation: with supervision;Standing ADL Goal: Toileting - Clothing Manipulation - Progress: Goal set today Pt Will Perform Toileting - Hygiene: with supervision;Sit to stand from 3-in-1/toilet ADL Goal: Toileting - Hygiene - Progress: Goal set today Pt Will Perform Tub/Shower Transfer: Shower transfer;with supervision;Ambulation ADL Goal: Tub/Shower Transfer - Progress: Goal set today  Visit Information  Last OT Received On: 04/22/12 Assistance Needed: +1    Subjective Data  Subjective: I just had PT. Patient Stated Goal: Get back to a normal life.   Prior  Functioning  Vision/Perception  Home Living Lives With: Alone Available Help at Discharge: Family;Friend(s);Available 24 hours/day Type of Home: Other (  Comment) (condo) Home Access: Level entry Home Layout: One level Bathroom Shower/Tub: Health visitor: Handicapped height Bathroom Accessibility: Yes How Accessible: Accessible via walker Home Adaptive Equipment: Bedside commode/3-in-1;Grab bars around toilet;Walker - rolling;Straight cane;Hand-held shower hose Prior Function Level of Independence: Independent Able to Take Stairs?: No Driving: Yes Vocation: Volunteer work Musician: No difficulties Dominant Hand: Right      Cognition  Overall Cognitive Status: Appears within functional limits for tasks assessed/performed Arousal/Alertness: Awake/alert Orientation Level: Appears intact for tasks assessed Behavior During Session: [facility] for tasks performed    Extremity/Trunk Assessment Right Upper Extremity Assessment RUE ROM/Strength/Tone: [facility] for tasks assessed Left Upper Extremity Assessment LUE ROM/Strength/Tone: WFL for tasks assessed   Mobility Bed Mobility Supine to Sit: 5: Supervision;HOB flat Sitting - Scoot to Edge of Bed: 5: Supervision Transfers Sit to Stand: 4: Min guard;With upper extremity assist;From bed Stand to Sit: 4: Min guard;With upper extremity assist;With armrests;To chair/3-in-1 Details for Transfer Assistance: Min VCs for safe hand placement and min A needed for RLE management.   Exercise    Balance    End of Session OT - End of Session Activity Tolerance: Patient limited by pain Patient left: in chair;with call bell/phone within reach;with family/visitor present    GO     Joelyn Lover A OTR/L [phone] 04/15/2012, 12:19 PM

## 2012-04-16 DIAGNOSIS — I4891 Unspecified atrial fibrillation: Secondary | ICD-10-CM | POA: Diagnosis not present

## 2012-04-16 DIAGNOSIS — Z5181 Encounter for therapeutic drug level monitoring: Secondary | ICD-10-CM | POA: Diagnosis not present

## 2012-04-16 DIAGNOSIS — Z96659 Presence of unspecified artificial knee joint: Secondary | ICD-10-CM | POA: Diagnosis not present

## 2012-04-16 DIAGNOSIS — I509 Heart failure, unspecified: Secondary | ICD-10-CM | POA: Diagnosis not present

## 2012-04-16 DIAGNOSIS — M1710 Unilateral primary osteoarthritis, unspecified knee: Secondary | ICD-10-CM | POA: Diagnosis not present

## 2012-04-16 DIAGNOSIS — Z471 Aftercare following joint replacement surgery: Secondary | ICD-10-CM | POA: Diagnosis not present

## 2012-04-17 ENCOUNTER — Other Ambulatory Visit: Payer: Self-pay | Admitting: *Deleted

## 2012-04-17 ENCOUNTER — Ambulatory Visit (INDEPENDENT_AMBULATORY_CARE_PROVIDER_SITE_OTHER): Payer: Medicare Other | Admitting: Cardiology

## 2012-04-17 ENCOUNTER — Telehealth: Payer: Self-pay | Admitting: Pharmacist

## 2012-04-17 DIAGNOSIS — Z7901 Long term (current) use of anticoagulants: Secondary | ICD-10-CM

## 2012-04-17 DIAGNOSIS — Z96659 Presence of unspecified artificial knee joint: Secondary | ICD-10-CM | POA: Diagnosis not present

## 2012-04-17 DIAGNOSIS — I509 Heart failure, unspecified: Secondary | ICD-10-CM | POA: Diagnosis not present

## 2012-04-17 DIAGNOSIS — I6350 Cerebral infarction due to unspecified occlusion or stenosis of unspecified cerebral artery: Secondary | ICD-10-CM

## 2012-04-17 DIAGNOSIS — I4891 Unspecified atrial fibrillation: Secondary | ICD-10-CM | POA: Diagnosis not present

## 2012-04-17 DIAGNOSIS — Z471 Aftercare following joint replacement surgery: Secondary | ICD-10-CM | POA: Diagnosis not present

## 2012-04-17 DIAGNOSIS — Z5181 Encounter for therapeutic drug level monitoring: Secondary | ICD-10-CM | POA: Diagnosis not present

## 2012-04-17 LAB — POCT INR: INR: 1.2

## 2012-04-17 MED ORDER — ENOXAPARIN SODIUM 100 MG/ML ~~LOC~~ SOLN: 100.0000 mg | Freq: Two times a day (BID) | SUBCUTANEOUS | Status: DC

## 2012-04-17 MED ORDER — PRAVASTATIN SODIUM 20 MG PO TABS: 20.0000 mg | ORAL_TABLET | Freq: Every day | ORAL | Status: DC

## 2012-04-17 NOTE — Telephone Encounter (Signed)
New Problem:    Called needing counsel about her enoxaparin (LOVENOX) 100 MG/ML injection. Patient failed to take her Lovenox last night and would like to know how to proceed.  Please call back. INR was 1.2.

## 2012-04-17 NOTE — Telephone Encounter (Signed)
Refill request for PRAVASTATIN Last filled- 09/26/11 Last seen-02/11/12 RX sent.

## 2012-04-17 NOTE — Telephone Encounter (Signed)
Bridget Hartshorn, RN spoke with pt about INR and dosing of Coumadin and Lovenox.  See anti-coag visit from today for details.

## 2012-04-20 DIAGNOSIS — I509 Heart failure, unspecified: Secondary | ICD-10-CM | POA: Diagnosis not present

## 2012-04-20 DIAGNOSIS — Z5181 Encounter for therapeutic drug level monitoring: Secondary | ICD-10-CM | POA: Diagnosis not present

## 2012-04-20 DIAGNOSIS — I4891 Unspecified atrial fibrillation: Secondary | ICD-10-CM | POA: Diagnosis not present

## 2012-04-20 DIAGNOSIS — Z471 Aftercare following joint replacement surgery: Secondary | ICD-10-CM | POA: Diagnosis not present

## 2012-04-20 DIAGNOSIS — Z96659 Presence of unspecified artificial knee joint: Secondary | ICD-10-CM | POA: Diagnosis not present

## 2012-04-21 ENCOUNTER — Ambulatory Visit: Payer: Self-pay | Admitting: Cardiology

## 2012-04-21 DIAGNOSIS — I509 Heart failure, unspecified: Secondary | ICD-10-CM | POA: Diagnosis not present

## 2012-04-21 DIAGNOSIS — I6350 Cerebral infarction due to unspecified occlusion or stenosis of unspecified cerebral artery: Secondary | ICD-10-CM

## 2012-04-21 DIAGNOSIS — Z5181 Encounter for therapeutic drug level monitoring: Secondary | ICD-10-CM | POA: Diagnosis not present

## 2012-04-21 DIAGNOSIS — Z471 Aftercare following joint replacement surgery: Secondary | ICD-10-CM | POA: Diagnosis not present

## 2012-04-21 DIAGNOSIS — Z7901 Long term (current) use of anticoagulants: Secondary | ICD-10-CM

## 2012-04-21 DIAGNOSIS — Z96659 Presence of unspecified artificial knee joint: Secondary | ICD-10-CM | POA: Diagnosis not present

## 2012-04-21 DIAGNOSIS — I4891 Unspecified atrial fibrillation: Secondary | ICD-10-CM

## 2012-04-22 DIAGNOSIS — I509 Heart failure, unspecified: Secondary | ICD-10-CM | POA: Diagnosis not present

## 2012-04-22 DIAGNOSIS — Z96659 Presence of unspecified artificial knee joint: Secondary | ICD-10-CM | POA: Diagnosis not present

## 2012-04-22 DIAGNOSIS — I4891 Unspecified atrial fibrillation: Secondary | ICD-10-CM | POA: Diagnosis not present

## 2012-04-22 DIAGNOSIS — Z471 Aftercare following joint replacement surgery: Secondary | ICD-10-CM | POA: Diagnosis not present

## 2012-04-22 DIAGNOSIS — Z5181 Encounter for therapeutic drug level monitoring: Secondary | ICD-10-CM | POA: Diagnosis not present

## 2012-04-23 DIAGNOSIS — I4891 Unspecified atrial fibrillation: Secondary | ICD-10-CM | POA: Diagnosis not present

## 2012-04-23 DIAGNOSIS — Z5181 Encounter for therapeutic drug level monitoring: Secondary | ICD-10-CM | POA: Diagnosis not present

## 2012-04-23 DIAGNOSIS — I509 Heart failure, unspecified: Secondary | ICD-10-CM | POA: Diagnosis not present

## 2012-04-23 DIAGNOSIS — Z471 Aftercare following joint replacement surgery: Secondary | ICD-10-CM | POA: Diagnosis not present

## 2012-04-23 DIAGNOSIS — Z96659 Presence of unspecified artificial knee joint: Secondary | ICD-10-CM | POA: Diagnosis not present

## 2012-04-24 ENCOUNTER — Ambulatory Visit: Payer: Self-pay | Admitting: Internal Medicine

## 2012-04-24 DIAGNOSIS — I509 Heart failure, unspecified: Secondary | ICD-10-CM | POA: Diagnosis not present

## 2012-04-24 DIAGNOSIS — Z96659 Presence of unspecified artificial knee joint: Secondary | ICD-10-CM | POA: Diagnosis not present

## 2012-04-24 DIAGNOSIS — Z7901 Long term (current) use of anticoagulants: Secondary | ICD-10-CM

## 2012-04-24 DIAGNOSIS — I6350 Cerebral infarction due to unspecified occlusion or stenosis of unspecified cerebral artery: Secondary | ICD-10-CM

## 2012-04-24 DIAGNOSIS — I4891 Unspecified atrial fibrillation: Secondary | ICD-10-CM | POA: Diagnosis not present

## 2012-04-24 DIAGNOSIS — Z5181 Encounter for therapeutic drug level monitoring: Secondary | ICD-10-CM | POA: Diagnosis not present

## 2012-04-24 DIAGNOSIS — Z471 Aftercare following joint replacement surgery: Secondary | ICD-10-CM | POA: Diagnosis not present

## 2012-04-24 LAB — POCT INR: INR: 2.5

## 2012-04-27 DIAGNOSIS — Z471 Aftercare following joint replacement surgery: Secondary | ICD-10-CM | POA: Diagnosis not present

## 2012-04-27 DIAGNOSIS — I509 Heart failure, unspecified: Secondary | ICD-10-CM | POA: Diagnosis not present

## 2012-04-27 DIAGNOSIS — I4891 Unspecified atrial fibrillation: Secondary | ICD-10-CM | POA: Diagnosis not present

## 2012-04-27 DIAGNOSIS — Z96659 Presence of unspecified artificial knee joint: Secondary | ICD-10-CM | POA: Diagnosis not present

## 2012-04-27 DIAGNOSIS — Z5181 Encounter for therapeutic drug level monitoring: Secondary | ICD-10-CM | POA: Diagnosis not present

## 2012-04-28 DIAGNOSIS — M1710 Unilateral primary osteoarthritis, unspecified knee: Secondary | ICD-10-CM | POA: Diagnosis not present

## 2012-04-29 DIAGNOSIS — Z471 Aftercare following joint replacement surgery: Secondary | ICD-10-CM | POA: Diagnosis not present

## 2012-04-29 DIAGNOSIS — Z96659 Presence of unspecified artificial knee joint: Secondary | ICD-10-CM | POA: Diagnosis not present

## 2012-04-29 DIAGNOSIS — I509 Heart failure, unspecified: Secondary | ICD-10-CM | POA: Diagnosis not present

## 2012-04-29 DIAGNOSIS — I4891 Unspecified atrial fibrillation: Secondary | ICD-10-CM | POA: Diagnosis not present

## 2012-04-29 DIAGNOSIS — Z5181 Encounter for therapeutic drug level monitoring: Secondary | ICD-10-CM | POA: Diagnosis not present

## 2012-04-30 ENCOUNTER — Ambulatory Visit: Payer: Medicare Other | Admitting: Physical Therapy

## 2012-04-30 DIAGNOSIS — Z96659 Presence of unspecified artificial knee joint: Secondary | ICD-10-CM | POA: Diagnosis not present

## 2012-04-30 DIAGNOSIS — I4891 Unspecified atrial fibrillation: Secondary | ICD-10-CM | POA: Diagnosis not present

## 2012-04-30 DIAGNOSIS — Z471 Aftercare following joint replacement surgery: Secondary | ICD-10-CM | POA: Diagnosis not present

## 2012-04-30 DIAGNOSIS — I509 Heart failure, unspecified: Secondary | ICD-10-CM | POA: Diagnosis not present

## 2012-04-30 DIAGNOSIS — Z5181 Encounter for therapeutic drug level monitoring: Secondary | ICD-10-CM | POA: Diagnosis not present

## 2012-05-01 ENCOUNTER — Ambulatory Visit: Payer: Self-pay | Admitting: Cardiology

## 2012-05-01 DIAGNOSIS — Z5181 Encounter for therapeutic drug level monitoring: Secondary | ICD-10-CM | POA: Diagnosis not present

## 2012-05-01 DIAGNOSIS — Z96659 Presence of unspecified artificial knee joint: Secondary | ICD-10-CM | POA: Diagnosis not present

## 2012-05-01 DIAGNOSIS — Z471 Aftercare following joint replacement surgery: Secondary | ICD-10-CM | POA: Diagnosis not present

## 2012-05-01 DIAGNOSIS — Z7901 Long term (current) use of anticoagulants: Secondary | ICD-10-CM

## 2012-05-01 DIAGNOSIS — I509 Heart failure, unspecified: Secondary | ICD-10-CM | POA: Diagnosis not present

## 2012-05-01 DIAGNOSIS — I4891 Unspecified atrial fibrillation: Secondary | ICD-10-CM

## 2012-05-01 DIAGNOSIS — I6350 Cerebral infarction due to unspecified occlusion or stenosis of unspecified cerebral artery: Secondary | ICD-10-CM

## 2012-05-01 LAB — POCT INR: INR: 2.4

## 2012-05-05 DIAGNOSIS — I509 Heart failure, unspecified: Secondary | ICD-10-CM | POA: Diagnosis not present

## 2012-05-05 DIAGNOSIS — Z96659 Presence of unspecified artificial knee joint: Secondary | ICD-10-CM | POA: Diagnosis not present

## 2012-05-05 DIAGNOSIS — Z471 Aftercare following joint replacement surgery: Secondary | ICD-10-CM | POA: Diagnosis not present

## 2012-05-05 DIAGNOSIS — I4891 Unspecified atrial fibrillation: Secondary | ICD-10-CM | POA: Diagnosis not present

## 2012-05-05 DIAGNOSIS — Z5181 Encounter for therapeutic drug level monitoring: Secondary | ICD-10-CM | POA: Diagnosis not present

## 2012-05-06 ENCOUNTER — Telehealth: Payer: Self-pay | Admitting: Internal Medicine

## 2012-05-06 NOTE — Telephone Encounter (Signed)
Spoke with pt.  She is going to be at an appt on Friday so not available to have RN come to her home.  LM with Clydie Braun with [facility] to have INR drawn on 8/15 rather than 8/16.

## 2012-05-06 NOTE — Telephone Encounter (Signed)
New Problem:    Patient called in needing approval for her INR for medicare because her appointment is 1 day earlier than when it was originally scheduled.  Please call back.

## 2012-05-07 DIAGNOSIS — Z471 Aftercare following joint replacement surgery: Secondary | ICD-10-CM | POA: Diagnosis not present

## 2012-05-07 DIAGNOSIS — I509 Heart failure, unspecified: Secondary | ICD-10-CM | POA: Diagnosis not present

## 2012-05-07 DIAGNOSIS — Z96659 Presence of unspecified artificial knee joint: Secondary | ICD-10-CM | POA: Diagnosis not present

## 2012-05-07 DIAGNOSIS — I4891 Unspecified atrial fibrillation: Secondary | ICD-10-CM | POA: Diagnosis not present

## 2012-05-07 DIAGNOSIS — Z5181 Encounter for therapeutic drug level monitoring: Secondary | ICD-10-CM | POA: Diagnosis not present

## 2012-05-08 ENCOUNTER — Ambulatory Visit: Payer: Medicare Other | Attending: Orthopedic Surgery | Admitting: Physical Therapy

## 2012-05-08 DIAGNOSIS — R5381 Other malaise: Secondary | ICD-10-CM | POA: Insufficient documentation

## 2012-05-08 DIAGNOSIS — M25669 Stiffness of unspecified knee, not elsewhere classified: Secondary | ICD-10-CM | POA: Insufficient documentation

## 2012-05-08 DIAGNOSIS — M25569 Pain in unspecified knee: Secondary | ICD-10-CM | POA: Insufficient documentation

## 2012-05-08 DIAGNOSIS — IMO0001 Reserved for inherently not codable concepts without codable children: Secondary | ICD-10-CM | POA: Diagnosis not present

## 2012-05-11 ENCOUNTER — Ambulatory Visit: Payer: Self-pay | Admitting: Cardiovascular Disease

## 2012-05-11 ENCOUNTER — Ambulatory Visit: Payer: Medicare Other | Admitting: Physical Therapy

## 2012-05-11 DIAGNOSIS — Z7901 Long term (current) use of anticoagulants: Secondary | ICD-10-CM

## 2012-05-11 DIAGNOSIS — I4891 Unspecified atrial fibrillation: Secondary | ICD-10-CM

## 2012-05-11 DIAGNOSIS — I6350 Cerebral infarction due to unspecified occlusion or stenosis of unspecified cerebral artery: Secondary | ICD-10-CM

## 2012-05-11 LAB — POCT INR: INR: 1.5

## 2012-05-12 ENCOUNTER — Ambulatory Visit: Payer: Medicare Other | Admitting: Physical Therapy

## 2012-05-12 DIAGNOSIS — M1710 Unilateral primary osteoarthritis, unspecified knee: Secondary | ICD-10-CM | POA: Diagnosis not present

## 2012-05-13 ENCOUNTER — Ambulatory Visit: Payer: Medicare Other

## 2012-05-14 DIAGNOSIS — H43819 Vitreous degeneration, unspecified eye: Secondary | ICD-10-CM | POA: Diagnosis not present

## 2012-05-14 DIAGNOSIS — H04129 Dry eye syndrome of unspecified lacrimal gland: Secondary | ICD-10-CM | POA: Diagnosis not present

## 2012-05-14 DIAGNOSIS — H2510 Age-related nuclear cataract, unspecified eye: Secondary | ICD-10-CM | POA: Diagnosis not present

## 2012-05-18 ENCOUNTER — Ambulatory Visit: Payer: Medicare Other | Admitting: Physical Therapy

## 2012-05-20 ENCOUNTER — Ambulatory Visit: Payer: Medicare Other | Admitting: Physical Therapy

## 2012-05-20 ENCOUNTER — Ambulatory Visit (INDEPENDENT_AMBULATORY_CARE_PROVIDER_SITE_OTHER): Payer: Medicare Other | Admitting: Pharmacist

## 2012-05-20 DIAGNOSIS — I4891 Unspecified atrial fibrillation: Secondary | ICD-10-CM

## 2012-05-20 DIAGNOSIS — Z7901 Long term (current) use of anticoagulants: Secondary | ICD-10-CM

## 2012-05-20 DIAGNOSIS — I6350 Cerebral infarction due to unspecified occlusion or stenosis of unspecified cerebral artery: Secondary | ICD-10-CM

## 2012-05-22 ENCOUNTER — Ambulatory Visit: Payer: Medicare Other | Admitting: Physical Therapy

## 2012-05-26 ENCOUNTER — Ambulatory Visit: Payer: Medicare Other | Attending: Orthopedic Surgery | Admitting: Physical Therapy

## 2012-05-26 DIAGNOSIS — M25669 Stiffness of unspecified knee, not elsewhere classified: Secondary | ICD-10-CM | POA: Diagnosis not present

## 2012-05-26 DIAGNOSIS — IMO0001 Reserved for inherently not codable concepts without codable children: Secondary | ICD-10-CM | POA: Insufficient documentation

## 2012-05-26 DIAGNOSIS — M25569 Pain in unspecified knee: Secondary | ICD-10-CM | POA: Diagnosis not present

## 2012-05-26 DIAGNOSIS — R5381 Other malaise: Secondary | ICD-10-CM | POA: Insufficient documentation

## 2012-05-29 ENCOUNTER — Ambulatory Visit: Payer: Medicare Other | Admitting: Physical Therapy

## 2012-06-01 ENCOUNTER — Ambulatory Visit: Payer: Medicare Other | Admitting: Physical Therapy

## 2012-06-04 ENCOUNTER — Ambulatory Visit (INDEPENDENT_AMBULATORY_CARE_PROVIDER_SITE_OTHER): Payer: Medicare Other | Admitting: *Deleted

## 2012-06-04 ENCOUNTER — Ambulatory Visit: Payer: Medicare Other | Admitting: Physical Therapy

## 2012-06-04 DIAGNOSIS — I6350 Cerebral infarction due to unspecified occlusion or stenosis of unspecified cerebral artery: Secondary | ICD-10-CM | POA: Diagnosis not present

## 2012-06-04 DIAGNOSIS — Z7901 Long term (current) use of anticoagulants: Secondary | ICD-10-CM | POA: Diagnosis not present

## 2012-06-04 DIAGNOSIS — I4891 Unspecified atrial fibrillation: Secondary | ICD-10-CM

## 2012-06-08 ENCOUNTER — Encounter: Payer: Medicare Other | Admitting: Physical Therapy

## 2012-06-08 ENCOUNTER — Ambulatory Visit: Payer: Medicare Other | Admitting: Physical Therapy

## 2012-06-09 ENCOUNTER — Encounter: Payer: Medicare Other | Admitting: Physical Therapy

## 2012-06-11 ENCOUNTER — Ambulatory Visit: Payer: Medicare Other | Admitting: Physical Therapy

## 2012-06-15 ENCOUNTER — Other Ambulatory Visit (INDEPENDENT_AMBULATORY_CARE_PROVIDER_SITE_OTHER): Payer: Self-pay | Admitting: Surgery

## 2012-06-15 DIAGNOSIS — Z853 Personal history of malignant neoplasm of breast: Secondary | ICD-10-CM

## 2012-06-16 ENCOUNTER — Ambulatory Visit: Payer: Medicare Other | Admitting: Physical Therapy

## 2012-06-18 ENCOUNTER — Ambulatory Visit: Payer: Medicare Other | Admitting: Physical Therapy

## 2012-06-18 ENCOUNTER — Ambulatory Visit (INDEPENDENT_AMBULATORY_CARE_PROVIDER_SITE_OTHER): Payer: Medicare Other | Admitting: *Deleted

## 2012-06-18 DIAGNOSIS — I6350 Cerebral infarction due to unspecified occlusion or stenosis of unspecified cerebral artery: Secondary | ICD-10-CM | POA: Diagnosis not present

## 2012-06-18 DIAGNOSIS — I4891 Unspecified atrial fibrillation: Secondary | ICD-10-CM | POA: Diagnosis not present

## 2012-06-18 DIAGNOSIS — Z7901 Long term (current) use of anticoagulants: Secondary | ICD-10-CM

## 2012-06-19 DIAGNOSIS — E785 Hyperlipidemia, unspecified: Secondary | ICD-10-CM | POA: Diagnosis not present

## 2012-06-19 DIAGNOSIS — E349 Endocrine disorder, unspecified: Secondary | ICD-10-CM | POA: Diagnosis not present

## 2012-06-19 DIAGNOSIS — N200 Calculus of kidney: Secondary | ICD-10-CM | POA: Diagnosis not present

## 2012-06-19 DIAGNOSIS — R5381 Other malaise: Secondary | ICD-10-CM | POA: Diagnosis not present

## 2012-06-19 DIAGNOSIS — Z23 Encounter for immunization: Secondary | ICD-10-CM | POA: Diagnosis not present

## 2012-06-23 ENCOUNTER — Ambulatory Visit: Payer: Medicare Other | Attending: Orthopedic Surgery | Admitting: Physical Therapy

## 2012-06-23 DIAGNOSIS — M25669 Stiffness of unspecified knee, not elsewhere classified: Secondary | ICD-10-CM | POA: Insufficient documentation

## 2012-06-23 DIAGNOSIS — IMO0001 Reserved for inherently not codable concepts without codable children: Secondary | ICD-10-CM | POA: Diagnosis not present

## 2012-06-23 DIAGNOSIS — M25569 Pain in unspecified knee: Secondary | ICD-10-CM | POA: Insufficient documentation

## 2012-06-23 DIAGNOSIS — R5381 Other malaise: Secondary | ICD-10-CM | POA: Insufficient documentation

## 2012-06-25 ENCOUNTER — Ambulatory Visit: Payer: Medicare Other | Admitting: Physical Therapy

## 2012-06-25 DIAGNOSIS — M25569 Pain in unspecified knee: Secondary | ICD-10-CM | POA: Diagnosis not present

## 2012-06-25 DIAGNOSIS — IMO0001 Reserved for inherently not codable concepts without codable children: Secondary | ICD-10-CM | POA: Diagnosis not present

## 2012-06-25 DIAGNOSIS — R5381 Other malaise: Secondary | ICD-10-CM | POA: Diagnosis not present

## 2012-06-25 DIAGNOSIS — M25669 Stiffness of unspecified knee, not elsewhere classified: Secondary | ICD-10-CM | POA: Diagnosis not present

## 2012-06-30 ENCOUNTER — Ambulatory Visit: Payer: Medicare Other | Admitting: Physical Therapy

## 2012-06-30 DIAGNOSIS — IMO0001 Reserved for inherently not codable concepts without codable children: Secondary | ICD-10-CM | POA: Diagnosis not present

## 2012-06-30 DIAGNOSIS — R5381 Other malaise: Secondary | ICD-10-CM | POA: Diagnosis not present

## 2012-06-30 DIAGNOSIS — M25669 Stiffness of unspecified knee, not elsewhere classified: Secondary | ICD-10-CM | POA: Diagnosis not present

## 2012-06-30 DIAGNOSIS — M25569 Pain in unspecified knee: Secondary | ICD-10-CM | POA: Diagnosis not present

## 2012-07-02 ENCOUNTER — Ambulatory Visit (INDEPENDENT_AMBULATORY_CARE_PROVIDER_SITE_OTHER): Payer: Medicare Other | Admitting: *Deleted

## 2012-07-02 ENCOUNTER — Ambulatory Visit: Payer: Medicare Other | Admitting: Physical Therapy

## 2012-07-02 DIAGNOSIS — M25669 Stiffness of unspecified knee, not elsewhere classified: Secondary | ICD-10-CM | POA: Diagnosis not present

## 2012-07-02 DIAGNOSIS — IMO0001 Reserved for inherently not codable concepts without codable children: Secondary | ICD-10-CM | POA: Diagnosis not present

## 2012-07-02 DIAGNOSIS — I4891 Unspecified atrial fibrillation: Secondary | ICD-10-CM | POA: Diagnosis not present

## 2012-07-02 DIAGNOSIS — R5381 Other malaise: Secondary | ICD-10-CM | POA: Diagnosis not present

## 2012-07-02 DIAGNOSIS — Z7901 Long term (current) use of anticoagulants: Secondary | ICD-10-CM | POA: Diagnosis not present

## 2012-07-02 DIAGNOSIS — M25569 Pain in unspecified knee: Secondary | ICD-10-CM | POA: Diagnosis not present

## 2012-07-02 DIAGNOSIS — I6350 Cerebral infarction due to unspecified occlusion or stenosis of unspecified cerebral artery: Secondary | ICD-10-CM | POA: Diagnosis not present

## 2012-07-07 ENCOUNTER — Ambulatory Visit: Payer: Medicare Other | Admitting: Physical Therapy

## 2012-07-07 DIAGNOSIS — M25669 Stiffness of unspecified knee, not elsewhere classified: Secondary | ICD-10-CM | POA: Diagnosis not present

## 2012-07-07 DIAGNOSIS — M25569 Pain in unspecified knee: Secondary | ICD-10-CM | POA: Diagnosis not present

## 2012-07-07 DIAGNOSIS — IMO0001 Reserved for inherently not codable concepts without codable children: Secondary | ICD-10-CM | POA: Diagnosis not present

## 2012-07-07 DIAGNOSIS — R5381 Other malaise: Secondary | ICD-10-CM | POA: Diagnosis not present

## 2012-07-23 ENCOUNTER — Ambulatory Visit (INDEPENDENT_AMBULATORY_CARE_PROVIDER_SITE_OTHER): Payer: Medicare Other | Admitting: Physician Assistant

## 2012-07-23 ENCOUNTER — Ambulatory Visit (INDEPENDENT_AMBULATORY_CARE_PROVIDER_SITE_OTHER): Payer: Medicare Other | Admitting: Pharmacist

## 2012-07-23 ENCOUNTER — Encounter (INDEPENDENT_AMBULATORY_CARE_PROVIDER_SITE_OTHER): Payer: Self-pay

## 2012-07-23 VITALS — BP 132/86 | HR 68 | Temp 97.1°F | Resp 16 | Ht 66.5 in | Wt 230.2 lb

## 2012-07-23 DIAGNOSIS — I4891 Unspecified atrial fibrillation: Secondary | ICD-10-CM | POA: Diagnosis not present

## 2012-07-23 DIAGNOSIS — Z4651 Encounter for fitting and adjustment of gastric lap band: Secondary | ICD-10-CM

## 2012-07-23 DIAGNOSIS — I6350 Cerebral infarction due to unspecified occlusion or stenosis of unspecified cerebral artery: Secondary | ICD-10-CM

## 2012-07-23 DIAGNOSIS — Z7901 Long term (current) use of anticoagulants: Secondary | ICD-10-CM | POA: Diagnosis not present

## 2012-07-23 NOTE — Progress Notes (Signed)
  HISTORY: Kayla Harrison is a 66 y.o.female who received an AP-Standard lap-band in October 2011 by Dr. Daphine Deutscher. She comes in with 9 lbs of weight gain since her last visit. She had a right knee replacement in July which clearly affected her physical activity. She denies regurgitation or reflux. She isn't particularly hungry but she does have increased portion sizes.  VITAL SIGNS: Filed Vitals:   07/23/12 1035  BP: 132/86  Pulse: 68  Temp: 97.1 F (36.2 C)  Resp: 16    PHYSICAL EXAM: Physical exam reveals a very well-appearing 66 y.o.female in no apparent distress Neurologic: Awake, alert, oriented Psych: Bright affect, conversant Respiratory: Breathing even and unlabored. No stridor or wheezing Abdomen: Soft, nontender, nondistended to palpation. Incisions well-healed. No incisional hernias. Port easily palpated. Extremities: Atraumatic, good range of motion.  ASSESMENT: 66 y.o.  female  s/p AP-Standard lap-band.   PLAN: The patient's port was accessed with a 20G Huber needle without difficulty. Clear fluid was aspirated and 0.5 mL saline was added to the port. The patient was able to swallow water without difficulty following the procedure and was instructed to take clear liquids for the next 24-48 hours and advance slowly as tolerated.

## 2012-07-23 NOTE — Patient Instructions (Signed)
Take clear liquids tonight. Thin protein shakes are ok to start tomorrow morning. Slowly advance your diet thereafter. Call us if you have persistent vomiting or regurgitation, night cough or reflux symptoms. Return as scheduled or sooner if you notice no changes in hunger/portion sizes.  

## 2012-07-24 ENCOUNTER — Other Ambulatory Visit (INDEPENDENT_AMBULATORY_CARE_PROVIDER_SITE_OTHER): Payer: Self-pay | Admitting: Surgery

## 2012-07-24 ENCOUNTER — Ambulatory Visit
Admission: RE | Admit: 2012-07-24 | Discharge: 2012-07-24 | Disposition: A | Discharge status: Home / Self Care | Payer: Medicare Other | Source: Ambulatory Visit | Attending: Surgery | Admitting: Surgery

## 2012-07-24 DIAGNOSIS — Z853 Personal history of malignant neoplasm of breast: Secondary | ICD-10-CM

## 2012-07-24 DIAGNOSIS — D0590 Unspecified type of carcinoma in situ of unspecified breast: Secondary | ICD-10-CM | POA: Diagnosis not present

## 2012-08-10 DIAGNOSIS — M25559 Pain in unspecified hip: Secondary | ICD-10-CM | POA: Diagnosis not present

## 2012-08-12 ENCOUNTER — Ambulatory Visit (INDEPENDENT_AMBULATORY_CARE_PROVIDER_SITE_OTHER): Payer: Medicare Other | Admitting: *Deleted

## 2012-08-12 DIAGNOSIS — I4891 Unspecified atrial fibrillation: Secondary | ICD-10-CM

## 2012-08-12 DIAGNOSIS — Z7901 Long term (current) use of anticoagulants: Secondary | ICD-10-CM | POA: Diagnosis not present

## 2012-08-12 DIAGNOSIS — I6350 Cerebral infarction due to unspecified occlusion or stenosis of unspecified cerebral artery: Secondary | ICD-10-CM | POA: Diagnosis not present

## 2012-08-12 LAB — POCT INR: INR: 2.9

## 2012-08-14 ENCOUNTER — Ambulatory Visit (INDEPENDENT_AMBULATORY_CARE_PROVIDER_SITE_OTHER): Payer: Medicare Other | Admitting: Family Medicine

## 2012-08-14 ENCOUNTER — Encounter: Payer: Self-pay | Admitting: Family Medicine

## 2012-08-14 VITALS — BP 110/70 | HR 68 | Temp 98.3°F | Wt 228.0 lb

## 2012-08-14 DIAGNOSIS — J209 Acute bronchitis, unspecified: Secondary | ICD-10-CM | POA: Diagnosis not present

## 2012-08-14 DIAGNOSIS — J329 Chronic sinusitis, unspecified: Secondary | ICD-10-CM | POA: Diagnosis not present

## 2012-08-14 MED ORDER — SULFAMETHOXAZOLE-TRIMETHOPRIM 800-160 MG PO TABS: 1.0000 | ORAL_TABLET | Freq: Two times a day (BID) | ORAL | Status: AC

## 2012-08-14 NOTE — Progress Notes (Signed)
  Subjective:    Patient ID: Kayla Harrison, female    DOB: 10-15-1945, 66 y.o.   MRN: [id number]  HPI Sinus infxn- sxs started 2-3 days ago, w/ facial pain/pressure.  This was preceded by persistent, severe HAs.  + PND.  Nasal congestion is green.  R ear pain.  + sore throat.  No fevers.  No N/V.  No known sick contacts.  Minimal cough.   Review of Systems For ROS see HPI     Objective:   Physical Exam  Vitals reviewed. Constitutional: She appears well-developed and well-nourished. No distress.  HENT:  Head: Normocephalic and atraumatic.  Right Ear: Tympanic membrane normal.  Left Ear: Tympanic membrane normal.  Nose: Mucosal edema and rhinorrhea present. Right sinus exhibits maxillary sinus tenderness and frontal sinus tenderness. Left sinus exhibits maxillary sinus tenderness and frontal sinus tenderness.  Mouth/Throat: Uvula is midline and mucous membranes are normal. Posterior oropharyngeal erythema present. No oropharyngeal exudate.  Eyes: Conjunctivae normal and EOM are normal. Pupils are equal, round, and reactive to light.  Neck: Normal range of motion. Neck supple.  Cardiovascular: Normal rate, regular rhythm and normal heart sounds.   Pulmonary/Chest: Effort normal and breath sounds normal. No respiratory distress. She has no wheezes.  Lymphadenopathy:    She has no cervical adenopathy.          Assessment & Plan:

## 2012-08-14 NOTE — Assessment & Plan Note (Signed)
Pt's sxs and PE consistent w/ infxn.  Start abx.  Reviewed supportive care and red flags that should prompt return.  Pt expressed understanding and is in agreement w/ plan.  

## 2012-08-14 NOTE — Patient Instructions (Addendum)
This is a sinus infection Start the Bactrim twice daily- take w/ food Make sure you check your coumadin b/c the bactrim can alter your level Add Mucinex to thin your congestion Call with any questions or concerns Happy Holidays!!!

## 2012-09-03 ENCOUNTER — Ambulatory Visit (INDEPENDENT_AMBULATORY_CARE_PROVIDER_SITE_OTHER): Payer: Medicare Other | Admitting: Physician Assistant

## 2012-09-03 ENCOUNTER — Encounter (INDEPENDENT_AMBULATORY_CARE_PROVIDER_SITE_OTHER): Payer: Self-pay

## 2012-09-03 VITALS — BP 132/74 | HR 69 | Temp 98.0°F | Resp 18 | Ht 65.5 in | Wt 236.0 lb

## 2012-09-03 DIAGNOSIS — Z4651 Encounter for fitting and adjustment of gastric lap band: Secondary | ICD-10-CM

## 2012-09-03 NOTE — Progress Notes (Signed)
  HISTORY: Kayla Harrison is a 66 y.o.female who received an AP-Standard lap-band in October 2011 by Dr. Daphine Deutscher. She comes in having been seen in late October with solid food dysphagia. She has resorted to slider foods and has unfortunately gained six pounds. She is able to swallow her medications without difficulty however. She does not want the entire fill from her last appointment removed. She had a sinus infection that was treated after her last visit and has resolved. She said the dysphagia began prior to her sinus issues so I'm not concerned that this was the trigger.  VITAL SIGNS: Filed Vitals:   09/03/12 1000  BP: 132/74  Pulse: 69  Temp: 98 F (36.7 C)  Resp: 18    PHYSICAL EXAM: Physical exam reveals a very well-appearing 66 y.o.female in no apparent distress Neurologic: Awake, alert, oriented Psych: Bright affect, conversant Respiratory: Breathing even and unlabored. No stridor or wheezing Abdomen: Soft, nontender, nondistended to palpation. Incisions well-healed. No incisional hernias. Port easily palpated. Extremities: Atraumatic, good range of motion.  ASSESMENT: 66 y.o.  female  s/p AP-Standard lap-band.   PLAN: The patient's port was accessed with a 20G Huber needle without difficulty. Clear fluid was aspirated and 0.25 mL saline was removed from the port. I did not perform a water swallow test as she has no liquid dysphagia. I asked her to return next week if she has persistent symptoms despite today's intervention. Otherwise she'll return in three months.

## 2012-09-03 NOTE — Patient Instructions (Signed)
Return in three months. Focus on good food choices as well as physical activity. Return sooner if you have an increase in hunger, portion sizes or weight. Return also for difficulty swallowing, night cough, reflux.   

## 2012-09-10 ENCOUNTER — Emergency Department (HOSPITAL_COMMUNITY)
Admission: EM | Admit: 2012-09-10 | Discharge: 2012-09-10 | Disposition: A | Discharge status: Home / Self Care | Payer: Medicare Other | Attending: Emergency Medicine | Admitting: Emergency Medicine

## 2012-09-10 ENCOUNTER — Emergency Department (HOSPITAL_COMMUNITY): Payer: Medicare Other

## 2012-09-10 ENCOUNTER — Encounter (HOSPITAL_COMMUNITY): Payer: Self-pay | Admitting: Emergency Medicine

## 2012-09-10 DIAGNOSIS — Z8701 Personal history of pneumonia (recurrent): Secondary | ICD-10-CM | POA: Diagnosis not present

## 2012-09-10 DIAGNOSIS — Z8679 Personal history of other diseases of the circulatory system: Secondary | ICD-10-CM | POA: Insufficient documentation

## 2012-09-10 DIAGNOSIS — E785 Hyperlipidemia, unspecified: Secondary | ICD-10-CM | POA: Insufficient documentation

## 2012-09-10 DIAGNOSIS — N390 Urinary tract infection, site not specified: Secondary | ICD-10-CM | POA: Insufficient documentation

## 2012-09-10 DIAGNOSIS — Z853 Personal history of malignant neoplasm of breast: Secondary | ICD-10-CM | POA: Insufficient documentation

## 2012-09-10 DIAGNOSIS — E039 Hypothyroidism, unspecified: Secondary | ICD-10-CM | POA: Diagnosis not present

## 2012-09-10 DIAGNOSIS — Z85850 Personal history of malignant neoplasm of thyroid: Secondary | ICD-10-CM | POA: Insufficient documentation

## 2012-09-10 DIAGNOSIS — Z8619 Personal history of other infectious and parasitic diseases: Secondary | ICD-10-CM | POA: Diagnosis not present

## 2012-09-10 DIAGNOSIS — Z860100 Personal history of colon polyps, unspecified: Secondary | ICD-10-CM | POA: Insufficient documentation

## 2012-09-10 DIAGNOSIS — Z7901 Long term (current) use of anticoagulants: Secondary | ICD-10-CM | POA: Insufficient documentation

## 2012-09-10 DIAGNOSIS — Z8673 Personal history of transient ischemic attack (TIA), and cerebral infarction without residual deficits: Secondary | ICD-10-CM | POA: Insufficient documentation

## 2012-09-10 DIAGNOSIS — M129 Arthropathy, unspecified: Secondary | ICD-10-CM | POA: Diagnosis not present

## 2012-09-10 DIAGNOSIS — N201 Calculus of ureter: Secondary | ICD-10-CM | POA: Insufficient documentation

## 2012-09-10 DIAGNOSIS — Z79899 Other long term (current) drug therapy: Secondary | ICD-10-CM | POA: Diagnosis not present

## 2012-09-10 DIAGNOSIS — N23 Unspecified renal colic: Secondary | ICD-10-CM

## 2012-09-10 DIAGNOSIS — N200 Calculus of kidney: Secondary | ICD-10-CM | POA: Diagnosis not present

## 2012-09-10 LAB — CBC WITH DIFFERENTIAL/PLATELET
Basophils Absolute: 0 10*3/uL (ref 0.0–0.1)
Eosinophils Relative: 1 % (ref 0–5)
HCT: 36.4 % (ref 36.0–46.0)
Lymphocytes Relative: 9 % — ABNORMAL LOW (ref 12–46)
Lymphs Abs: 0.7 10*3/uL (ref 0.7–4.0)
MCV: 85.6 fL (ref 78.0–100.0)
Monocytes Absolute: 0.6 10*3/uL (ref 0.1–1.0)
RDW: 17.3 % — ABNORMAL HIGH (ref 11.5–15.5)
WBC: 7.5 10*3/uL (ref 4.0–10.5)

## 2012-09-10 LAB — URINALYSIS, ROUTINE W REFLEX MICROSCOPIC
Protein, ur: 100 mg/dL — AB
Urobilinogen, UA: 0.2 mg/dL (ref 0.0–1.0)

## 2012-09-10 LAB — BASIC METABOLIC PANEL
Calcium: 9.8 mg/dL (ref 8.4–10.5)
GFR calc non Af Amer: 46 mL/min — ABNORMAL LOW (ref >90)
Sodium: 137 mEq/L (ref 135–145)

## 2012-09-10 LAB — PROTIME-INR
INR: 2.39 — ABNORMAL HIGH (ref 0.00–1.49)
Prothrombin Time: 25 seconds — ABNORMAL HIGH (ref 11.6–15.2)

## 2012-09-10 LAB — URINE MICROSCOPIC-ADD ON

## 2012-09-10 MED ORDER — OXYCODONE-ACETAMINOPHEN 5-325 MG PO TABS: 1.0000 | ORAL_TABLET | ORAL | Status: DC | PRN

## 2012-09-10 MED ORDER — HYDROMORPHONE HCL PF 1 MG/ML IJ SOLN
0.5000 mg | Freq: Once | INTRAMUSCULAR | Status: AC
  Administered 2012-09-10: 0.5 mg via INTRAVENOUS
  Filled 2012-09-10: qty 1

## 2012-09-10 MED ORDER — CEPHALEXIN 250 MG PO CAPS: 250.0000 mg | ORAL_CAPSULE | Freq: Four times a day (QID) | ORAL | Status: DC

## 2012-09-10 MED ORDER — ONDANSETRON HCL 4 MG/2ML IJ SOLN
4.0000 mg | Freq: Once | INTRAMUSCULAR | Status: AC
  Administered 2012-09-10: 4 mg via INTRAVENOUS
  Filled 2012-09-10: qty 2

## 2012-09-10 MED ORDER — SODIUM CHLORIDE 0.9 % IV SOLN
INTRAVENOUS | Status: DC
  Administered 2012-09-10 (×2): 20 mL/h via INTRAVENOUS

## 2012-09-10 NOTE — ED Notes (Addendum)
Pt sts pain started at 0230.  Sts she had similar pain when she had a kidney stone.  Denies GU symptoms and injury.

## 2012-09-10 NOTE — ED Notes (Signed)
Off floor for testing 

## 2012-09-10 NOTE — ED Notes (Signed)
Urine strained by patient, no stone/s noted

## 2012-09-10 NOTE — ED Notes (Signed)
Pt states that she woke up with left sided flank pain. Pt denies having felt this type of pain before and any injury or trauma to the area.

## 2012-09-10 NOTE — ED Provider Notes (Signed)
History     CSN: [id number]  Arrival date & time 09/10/12  4098   First MD Initiated Contact with Patient 09/10/12 [phone]      Chief Complaint  Patient presents with  . Flank Pain    (Consider location/radiation/quality/duration/timing/severity/associated sxs/prior treatment) HPI Comments: Kayla Harrison is a 66 y.o. Female who was awakened from sleep at 2:30 this morning with left lower back pain. The pain radiates to the left groin. There is no associated fever, or chills, dysuria, urinary frequency, diarrhea, bloody stool, weakness, or paresthesias. She denies trauma. During the night. She had nausea and dry heaves. The pain is 8/10, and waxes and wanes. She previously had similar pain when she passed a kidney stone. She has chronic, intermittent hematuria, that has been evaluated with cystoscopy. She is not currently seeing a urologist. There are no modifying factors. She didn't take any medicines. This morning, or eat.  Patient is a 66 y.o. female presenting with flank pain. The history is provided by the patient.  Flank Pain    Past Medical History  Diagnosis Date  . Morbid obesity     Status post lap band surgery  . Hx of thyroid cancer     Dr Evlyn Kanner  . Colon polyp     Dr Kinnie Scales  . Dyslipidemia   . Hyperlipidemia   . Hypothyroidism   . Breast cancer     Dr Jamey Ripa, total thyroidectomy- 1999- for cancer  . Pneumonia     2010- hosp. here [facility]- during that event had to be cardioverted   . Arthritis     osteoarthritis - knees   . Normal cardiac stress test     2008- cardiac stress/echo  . Stroke     2003- [place]   . Atrial fibrillation     ablation- 2x's-- 1st time- [facility], 2nd event at [facility] in 2008. Dr. Graciela Husbands- [place]- sees pt. once /yr. ,  . Hepatitis     [place] a [place], ?hepatitis   . Complication of anesthesia     Ketamine produces LSD reaction, bright colored nightmarish experience     Past Surgical History  Procedure  Date  . Thyroidectomy 1998    Dr Jamey Ripa  . Bso 1998  . Cholecystectomy   . Tonsillectomy   . Colonoscopy w/ polypectomy     Dr Kinnie Scales  . Afib ablation     X 2; [facility] & Dr Graciela Husbands  . Abdominal hysterectomy 1983  . Breast lumpectomy 2010  . Laparoscopic gastric banding 07/10/2010  . Appendectomy   . Knee arthroscopy     bilateral  . Laparotomy     for ruptured ovary and ovarian artery   . Total knee arthroplasty 04/13/2012    Procedure: TOTAL KNEE ARTHROPLASTY;  Surgeon: Raymon Mutton, MD;  Location: [facility] OR;  Service: Orthopedics;  Laterality: Right;  . Joint replacement 04/13/12    Right Knee replacement    Family History  Problem Relation Age of Onset  . Cancer Father     COLON  . Heart disease Father 71    MI @autopsy   . Diabetes Sister   . Diabetes Brother   . Diabetes Paternal Aunt   . Diabetes Paternal Grandmother     History  Substance Use Topics  . Smoking status: Never Smoker   . Smokeless tobacco: Never Used  . Alcohol Use: No    OB History    Grav Para Term Preterm Abortions TAB SAB Ect  Mult Living                  Review of Systems  Genitourinary: Positive for flank pain.  All other systems reviewed and are negative.    Allergies  Xarelto and Epinephrine  Home Medications   Current Outpatient Rx  Name  Route  Sig  Dispense  Refill  . AMIODARONE HCL 200 MG PO TABS   Oral   Take 200 mg by mouth See admin instructions. mon-fri only doesn't take on Saturday or sunday         . CALCIUM CITRATE + PO   Oral   Take 1 tablet by mouth daily.          . CELECOXIB 200 MG PO CAPS   Oral   Take 200 mg by mouth daily.         Marland Kitchen VITAMIN D3 1000 UNITS PO CAPS   Oral   Take 1 capsule by mouth daily.          Marland Kitchen LEVOTHYROXINE SODIUM 175 MCG PO TABS   Oral   Take 175 mcg by mouth as directed. Take 6 days a week. Doesn't take on Saturday         . THERA M PLUS PO TABS   Oral   Take 1 tablet by mouth daily.         Marland Kitchen PRAVASTATIN SODIUM  20 MG PO TABS   Oral   Take 1 tablet (20 mg total) by mouth daily. Take 1 tablet at bedtime.   90 tablet   1   . WARFARIN SODIUM 5 MG PO TABS   Oral   Take 2.5-5 mg by mouth daily. Take 1 tablet (5 mg) on Sunday,tuesday,thursday, & Saturday Take 0.5  (2.5) tablet on all other days         . CEPHALEXIN 250 MG PO CAPS   Oral   Take 1 capsule (250 mg total) by mouth 4 (four) times daily.   28 capsule   0   . TYLENOL PM EXTRA STRENGTH PO   Oral   Take 1-2 tablets by mouth 2 (two) times daily as needed. For sleep or pain         . FUROSEMIDE 40 MG PO TABS   Oral   Take 40 mg by mouth daily as needed. For fluid         . OXYCODONE-ACETAMINOPHEN 5-325 MG PO TABS   Oral   Take 1 tablet by mouth every 4 (four) hours as needed for pain.   20 tablet   0     BP 133/59  Pulse 66  Temp 97.9 F (36.6 C) (Oral)  Resp 16  Ht 5\' 6"  (1.676 m)  Wt 215 lb (97.523 kg)  BMI 34.70 kg/m2  SpO2 94%  Physical Exam  Nursing note and vitals reviewed. Constitutional: She is oriented to person, place, and time. She appears well-developed.       obese  HENT:  Head: Normocephalic and atraumatic.  Eyes: Conjunctivae normal and EOM are normal. Pupils are equal, round, and reactive to light.  Neck: Normal range of motion and phonation normal. Neck supple.  Cardiovascular: Normal rate, regular rhythm and intact distal pulses.   Pulmonary/Chest: Effort normal and breath sounds normal. She exhibits no tenderness.  Abdominal: Soft. Bowel sounds are normal. She exhibits no distension. There is tenderness (Left upper quadrant,). There is no rebound and no guarding.  Genitourinary:       Mild left  costovertebral angle tenderness  Musculoskeletal: Normal range of motion.  Neurological: She is alert and oriented to person, place, and time. She has normal strength. She exhibits normal muscle tone.  Skin: Skin is warm and dry.  Psychiatric: She has a normal mood and affect. Her behavior is normal.  Judgment and thought content normal.    ED Course  Procedures (including critical care time)  Emergency department treatment: IV, Dilaudid, and Zofran  CT imaging to evaluate for kidney stone.   Blood work for, assessment of renal function, and the status of anticoagulation.   Reevaluation at discharge. Pain was controlled. She had passed a kidney stone in the ED. She felt comfortable   Labs Reviewed  CBC WITH DIFFERENTIAL - Abnormal; Notable for the following:    RDW 17.3 (*)     Neutrophils Relative 82 (*)     Lymphocytes Relative 9 (*)     All other components within normal limits  BASIC METABOLIC PANEL - Abnormal; Notable for the following:    Glucose, Bld 116 (*)     Creatinine, Ser 1.18 (*)     GFR calc non Af Amer 46 (*)     GFR calc Af Amer 54 (*)     All other components within normal limits  URINALYSIS, ROUTINE W REFLEX MICROSCOPIC - Abnormal; Notable for the following:    APPearance CLOUDY (*)     Hgb urine dipstick LARGE (*)     Protein, ur 100 (*)     Leukocytes, UA MODERATE (*)     All other components within normal limits  PROTIME-INR - Abnormal; Notable for the following:    Prothrombin Time 25.0 (*)     INR 2.39 (*)     All other components within normal limits  URINE MICROSCOPIC-ADD ON - Abnormal; Notable for the following:    Bacteria, UA FEW (*)     All other components within normal limits  URINE CULTURE   Ct Abdomen Pelvis Wo Contrast  09/10/2012  *RADIOLOGY REPORT*  Clinical Data: Left flank pain.  CT ABDOMEN AND PELVIS WITHOUT CONTRAST  Technique:  Multidetector CT imaging of the abdomen and pelvis was performed following the standard protocol without intravenous contrast.  Comparison: 08/01/2006.  Findings: The lung bases are clear.  No pleural effusion or pulmonary nodule.  The heart is normal in size.  No pericardial effusion.  Gastric lap band is in good position with normal orientation.  No complicating features.  The liver is unremarkable  without contrast. No worrisome hepatic lesions or intrahepatic biliary dilatation.  A few small calcified granulomas are noted.  The common bile duct is mildly dilated.  This is likely due to prior cholecystectomy.  The pancreas is unremarkable.  The spleen demonstrates small scattered calcified hemangiomas.  A small calcified splenic artery aneurysm is noted.  The adrenal glands are normal.  There are multiple bilateral renal calculi.  There is a large, 13.5 x 9.5 mm, calculus occupying the right renal pelvis but no evidence of ureteral obstruction.  The left kidney demonstrates moderate hydronephrosis along with perinephric interstitial changes and fluid consistent with a high-grade obstruction.  Left ureter is dilated mildly down to the bladder but no ureteral calculi are present.  There is however a 4.5 mm bladder calculus likely recently passed from the left ureter.  The stomach, duodenum, small bowel and colon are grossly normal without oral contrast.  No mesenteric or retroperitoneal mass or adenopathy.  The aorta is normal in caliber.  No  significant atherosclerotic calcifications.  The uterus is surgically absent.  No pelvic mass, adenopathy or free pelvic fluid collections.  No inguinal mass or adenopathy.  The bony structures are intact.  Moderate osteoporosis is noted.  IMPRESSION:  1.  Left-sided hydroureteronephrosis with probable recently passed a 4.5 mm left ureteral calculus into the bladder. 2.  Multiple bilateral renal calculi.  A 13.5 x 9.5 mm calculus is noted in the right renal pelvis.   Original Report Authenticated By: Rudie Meyer, M.D.     Nursing notes, applicable records and vitals reviewed.  Radiologic Images/Reports reviewed.     1. Ureter colic   2. Ureteral stone   3. UTI (lower urinary tract infection)       MDM  Flank pain, secondary to ureteral stone. The stone was passed in the ED. No evidence for urinary tract infection. Coumadin toxicity, or systemic infection.  Doubt metabolic instability, serious bacterial infection or impending vascular collapse; the patient is stable for discharge.     Plan: Home Medications- Percocet, Keflex; Home Treatments- rest; Recommended follow up- Urology 1 week   Flint Melter, MD 09/10/12 1539

## 2012-09-10 NOTE — ED Notes (Signed)
Patient passed small, black stone-placed in specimen cup-patient will take to follow up appointment

## 2012-09-10 NOTE — ED Notes (Signed)
MD at bedside. 

## 2012-09-10 NOTE — ED Notes (Signed)
Pt has been placed in a gown.

## 2012-09-10 NOTE — ED Notes (Signed)
Pt informed that she needs to begin straining urine.  Strainer placed at bedside.

## 2012-09-11 LAB — URINE CULTURE: Culture: NO GROWTH

## 2012-09-22 ENCOUNTER — Telehealth: Payer: Self-pay | Admitting: Internal Medicine

## 2012-09-22 NOTE — Telephone Encounter (Signed)
Patient Information:  Caller Name: Daaiyah  Phone: [phone]  Patient: [name], Tinnie Gens R  Gender: Female  DOB: 08-26-1946  Age: 66 Years  PCP: Marga Melnick  Office Follow Up:  Does the office need to follow up with this patient?: No  Instructions For The Office: N/A   Symptoms  Reason For Call & Symptoms: Pt is calling to see if MD would call in codeine cough medicine. She has atrial fib and can't take any other kind of cough medicine.  Reviewed Health History In EMR: Yes  Reviewed Medications In EMR: Yes  Reviewed Allergies In EMR: Yes  Reviewed Surgeries / Procedures: Yes  Date of Onset of Symptoms: 09/18/2012  Treatments Tried: Mucinex DM but pharmacist advised her there is not cough medicine she can take other than codeine with atrial fib  Treatments Tried Worked: No  Guideline(s) Used:  Cough  Disposition Per Guideline:   See Today in Office  Reason For Disposition Reached:   Severe coughing spells (e.g., whooping sound after coughing, vomiting after coughing)  Advice Given:  N/A  RN Overrode Recommendation:  Patient Requests Prescription  office already closed. Callbacks from daytime taking longer. Called MD on call with request. Dr. Felicity Coyer advised no codeine after hours but pt can take plain Mucinex just no DM

## 2012-09-24 MED ORDER — HYDROCODONE-HOMATROPINE 5-1.5 MG/5ML PO SYRP: ORAL_SOLUTION | ORAL | Status: DC

## 2012-09-24 NOTE — Addendum Note (Signed)
Addended by: Candie Echevaria L on: 09/24/2012 09:16 AM   Modules accepted: Orders

## 2012-09-24 NOTE — Telephone Encounter (Signed)
Discuss with patient, Rx sent. 

## 2012-09-24 NOTE — Telephone Encounter (Signed)
Noted, patient was given instruction per on call MD

## 2012-09-24 NOTE — Telephone Encounter (Signed)
Generic hydromet 60 cc 1 tsp q 6 hrs prn . OVINB

## 2012-09-25 DIAGNOSIS — N210 Calculus in bladder: Secondary | ICD-10-CM | POA: Diagnosis not present

## 2012-09-25 DIAGNOSIS — N200 Calculus of kidney: Secondary | ICD-10-CM | POA: Diagnosis not present

## 2012-09-25 DIAGNOSIS — R310 Gross hematuria: Secondary | ICD-10-CM | POA: Diagnosis not present

## 2012-09-29 DIAGNOSIS — M25569 Pain in unspecified knee: Secondary | ICD-10-CM | POA: Diagnosis not present

## 2012-10-07 ENCOUNTER — Ambulatory Visit (INDEPENDENT_AMBULATORY_CARE_PROVIDER_SITE_OTHER): Payer: Medicare Other | Admitting: *Deleted

## 2012-10-07 ENCOUNTER — Other Ambulatory Visit: Payer: Self-pay | Admitting: Internal Medicine

## 2012-10-07 DIAGNOSIS — H2510 Age-related nuclear cataract, unspecified eye: Secondary | ICD-10-CM | POA: Diagnosis not present

## 2012-10-07 DIAGNOSIS — I4891 Unspecified atrial fibrillation: Secondary | ICD-10-CM | POA: Diagnosis not present

## 2012-10-07 DIAGNOSIS — I6350 Cerebral infarction due to unspecified occlusion or stenosis of unspecified cerebral artery: Secondary | ICD-10-CM | POA: Diagnosis not present

## 2012-10-07 DIAGNOSIS — Z7901 Long term (current) use of anticoagulants: Secondary | ICD-10-CM | POA: Diagnosis not present

## 2012-10-08 ENCOUNTER — Encounter (HOSPITAL_COMMUNITY): Payer: Self-pay | Admitting: Family Medicine

## 2012-10-08 ENCOUNTER — Other Ambulatory Visit: Payer: Self-pay

## 2012-10-08 ENCOUNTER — Emergency Department (HOSPITAL_COMMUNITY)
Admission: EM | Admit: 2012-10-08 | Discharge: 2012-10-09 | Disposition: A | Discharge status: Home / Self Care | Payer: Medicare Other | Attending: Emergency Medicine | Admitting: Emergency Medicine

## 2012-10-08 ENCOUNTER — Emergency Department (HOSPITAL_COMMUNITY): Payer: Medicare Other

## 2012-10-08 DIAGNOSIS — Z8701 Personal history of pneumonia (recurrent): Secondary | ICD-10-CM | POA: Insufficient documentation

## 2012-10-08 DIAGNOSIS — IMO0002 Reserved for concepts with insufficient information to code with codable children: Secondary | ICD-10-CM | POA: Diagnosis not present

## 2012-10-08 DIAGNOSIS — Z85850 Personal history of malignant neoplasm of thyroid: Secondary | ICD-10-CM | POA: Insufficient documentation

## 2012-10-08 DIAGNOSIS — I4891 Unspecified atrial fibrillation: Secondary | ICD-10-CM | POA: Diagnosis not present

## 2012-10-08 DIAGNOSIS — R05 Cough: Secondary | ICD-10-CM | POA: Diagnosis not present

## 2012-10-08 DIAGNOSIS — E039 Hypothyroidism, unspecified: Secondary | ICD-10-CM | POA: Insufficient documentation

## 2012-10-08 DIAGNOSIS — Z860100 Personal history of colon polyps, unspecified: Secondary | ICD-10-CM | POA: Insufficient documentation

## 2012-10-08 DIAGNOSIS — R0602 Shortness of breath: Secondary | ICD-10-CM | POA: Diagnosis not present

## 2012-10-08 DIAGNOSIS — Z79899 Other long term (current) drug therapy: Secondary | ICD-10-CM | POA: Diagnosis not present

## 2012-10-08 DIAGNOSIS — M1710 Unilateral primary osteoarthritis, unspecified knee: Secondary | ICD-10-CM | POA: Insufficient documentation

## 2012-10-08 DIAGNOSIS — R42 Dizziness and giddiness: Secondary | ICD-10-CM | POA: Insufficient documentation

## 2012-10-08 DIAGNOSIS — Z8719 Personal history of other diseases of the digestive system: Secondary | ICD-10-CM | POA: Insufficient documentation

## 2012-10-08 DIAGNOSIS — R51 Headache: Secondary | ICD-10-CM | POA: Diagnosis not present

## 2012-10-08 DIAGNOSIS — E785 Hyperlipidemia, unspecified: Secondary | ICD-10-CM | POA: Diagnosis not present

## 2012-10-08 DIAGNOSIS — Z8673 Personal history of transient ischemic attack (TIA), and cerebral infarction without residual deficits: Secondary | ICD-10-CM | POA: Insufficient documentation

## 2012-10-08 DIAGNOSIS — Z853 Personal history of malignant neoplasm of breast: Secondary | ICD-10-CM | POA: Insufficient documentation

## 2012-10-08 DIAGNOSIS — Z9889 Other specified postprocedural states: Secondary | ICD-10-CM | POA: Diagnosis not present

## 2012-10-08 DIAGNOSIS — R002 Palpitations: Secondary | ICD-10-CM | POA: Diagnosis not present

## 2012-10-08 LAB — CBC WITH DIFFERENTIAL/PLATELET
Eosinophils Absolute: 0.1 10*3/uL (ref 0.0–0.7)
Eosinophils Relative: 2 % (ref 0–5)
HCT: 42.8 % (ref 36.0–46.0)
Lymphs Abs: 1.6 10*3/uL (ref 0.7–4.0)
MCH: 28.8 pg (ref 26.0–34.0)
MCV: 88.8 fL (ref 78.0–100.0)
Monocytes Absolute: 0.8 10*3/uL (ref 0.1–1.0)
Monocytes Relative: 12 % (ref 3–12)
Platelets: 247 10*3/uL (ref 150–400)
RBC: 4.82 MIL/uL (ref 3.87–5.11)

## 2012-10-08 LAB — BASIC METABOLIC PANEL
BUN: 26 mg/dL — ABNORMAL HIGH (ref 6–23)
CO2: 24 mEq/L (ref 19–32)
Calcium: 9.5 mg/dL (ref 8.4–10.5)
Creatinine, Ser: 1.27 mg/dL — ABNORMAL HIGH (ref 0.50–1.10)
GFR calc non Af Amer: 42 mL/min — ABNORMAL LOW (ref >90)
Glucose, Bld: 113 mg/dL — ABNORMAL HIGH (ref 70–99)
Sodium: 139 mEq/L (ref 135–145)

## 2012-10-08 MED ORDER — DILTIAZEM HCL 100 MG IV SOLR
5.0000 mg/h | INTRAVENOUS | Status: DC
  Administered 2012-10-08: 5 mg/h via INTRAVENOUS
  Filled 2012-10-08: qty 100

## 2012-10-08 MED ORDER — DILTIAZEM LOAD VIA INFUSION: 10.0000 mg | Freq: Once | INTRAVENOUS | Status: DC

## 2012-10-08 MED ORDER — DILTIAZEM HCL ER COATED BEADS 240 MG PO CP24
240.0000 mg | ORAL_CAPSULE | Freq: Once | ORAL | Status: AC
  Administered 2012-10-08: 240 mg via ORAL
  Filled 2012-10-08: qty 1

## 2012-10-08 MED ORDER — DILTIAZEM HCL 90 MG PO TABS: 240.0000 mg | ORAL_TABLET | Freq: Once | ORAL | Status: DC

## 2012-10-08 MED ORDER — DILTIAZEM HCL 25 MG/5ML IV SOLN
20.0000 mg | Freq: Once | INTRAVENOUS | Status: DC
  Administered 2012-10-08: 20 mg via INTRAVENOUS
  Filled 2012-10-08: qty 5

## 2012-10-08 MED ORDER — AMIODARONE IV BOLUS ONLY 150 MG/100ML
150.0000 mg | Freq: Once | INTRAVENOUS | Status: AC
  Administered 2012-10-08: 150 mg via INTRAVENOUS
  Filled 2012-10-08: qty 100

## 2012-10-08 MED ORDER — DILTIAZEM LOAD VIA INFUSION
20.0000 mg | Freq: Once | INTRAVENOUS | Status: DC
  Filled 2012-10-08: qty 20

## 2012-10-08 NOTE — ED Notes (Signed)
PA Dammen at bedside. 

## 2012-10-08 NOTE — ED Notes (Signed)
PA at bedside to discuss plan of care at this time. Please discontinue Cardizem gtt at start of amiodarone per verbal order by peter damen, PA

## 2012-10-08 NOTE — ED Notes (Signed)
[id number]<AV> Expected date:<BR> Expected time:<BR> Means of arrival:<BR> Comments:<BR> TR1

## 2012-10-08 NOTE — ED Notes (Signed)
Patient states that she has had palpitations and shortness of breath since around 7am. States she also has a "tight band feeling around her head." States she has felt her heart "fluttering"; reports she has a history of a-fib but this does not feel like her a-fib. Denies chest pain.

## 2012-10-08 NOTE — ED Provider Notes (Signed)
History     CSN: [id number]  Arrival date & time 10/08/12  1949   First MD Initiated Contact with Patient 10/08/12 2007      Chief Complaint  Patient presents with  . Palpitations   HPI  History provided by the patient. Patient is a 67 year old female with history of previous breast cancer, dyslipidemia, A. fib on warfarin, CVA who presents with complaints of rapid heart rate and palpitations. Patient states symptoms first began this morning after waking. She reports feeling slightly dizzy and lightheaded. She reports noticing a fast and occasional palpitations of her heart throughout the day. Patient states she has prior history of A. fib and a flutter. She is currently on amiodarone and warfarin and states she has not had any significant problems for the past several years. She states symptoms do not feel like A. fib but may be like a flutter. She also complains of an atypical mild to moderate headache described as a band around her head for most of the afternoon and evening. She continues to have episodes of dizziness and occasionally feels off balance and spinning. She reports shortness of breath even at rest worse with activity. Denies having any chest pain, nausea or diaphoresis. Patient has been well otherwise. She has been taking other medications normally as prescribed. She does report being seen by ophthalmologist yesterday and given new eyedrops but no other medications.    Past Medical History  Diagnosis Date  . Morbid obesity     Status post lap band surgery  . Hx of thyroid cancer     Dr Evlyn Kanner  . Colon polyp     Dr Kinnie Scales  . Dyslipidemia   . Hyperlipidemia   . Hypothyroidism   . Breast cancer     Dr Jamey Ripa, total thyroidectomy- 1999- for cancer  . Pneumonia     2010- hosp. here [facility]- during that event had to be cardioverted   . Arthritis     osteoarthritis - knees   . Normal cardiac stress test     2008- cardiac stress/echo  . Stroke     2003- [place]   . Atrial  fibrillation     ablation- 2x's-- 1st time- [facility], 2nd event at [facility] in 2008. Dr. Graciela Husbands- [place]- sees pt. once /yr. ,  . Hepatitis     [place], ?hepatitis   . Complication of anesthesia     Ketamine produces LSD reaction, bright colored nightmarish experience     Past Surgical History  Procedure Date  . Thyroidectomy 1998    Dr Jamey Ripa  . Bso 1998  . Cholecystectomy   . Tonsillectomy   . Colonoscopy w/ polypectomy     Dr Kinnie Scales  . Afib ablation     X 2; [facility] & Dr Graciela Husbands  . Abdominal hysterectomy 1983  . Breast lumpectomy 2010  . Laparoscopic gastric banding 07/10/2010  . Appendectomy   . Knee arthroscopy     bilateral  . Laparotomy     for ruptured ovary and ovarian artery   . Total knee arthroplasty 04/13/2012    Procedure: TOTAL KNEE ARTHROPLASTY;  Surgeon: Raymon Mutton, MD;  Location: [facility] OR;  Service: Orthopedics;  Laterality: Right;  . Joint replacement 04/13/12    Right Knee replacement    Family History  Problem Relation Age of Onset  . Cancer Father     COLON  . Heart disease Father 77    MI @autopsy   . Diabetes  Sister   . Diabetes Brother   . Diabetes Paternal Aunt   . Diabetes Paternal Grandmother     History  Substance Use Topics  . Smoking status: Never Smoker   . Smokeless tobacco: Never Used  . Alcohol Use: No    OB History    Grav Para Term Preterm Abortions TAB SAB Ect Mult Living                  Review of Systems  Constitutional: Negative for fever, diaphoresis and unexpected weight change.  Respiratory: Positive for shortness of breath. Negative for cough.   Cardiovascular: Positive for palpitations. Negative for chest pain and leg swelling.  Gastrointestinal: Negative for nausea and vomiting.  Neurological: Positive for dizziness and headaches.  All other systems reviewed and are negative.    Allergies  Xarelto and Epinephrine  Home Medications   Current Outpatient Rx  Name  Route   Sig  Dispense  Refill  . AMIODARONE HCL 200 MG PO TABS   Oral   Take 200 mg by mouth See admin instructions. mon-fri only doesn't take on Saturday or sunday         . CALCIUM CITRATE + PO   Oral   Take 1 tablet by mouth daily.          . CELECOXIB 200 MG PO CAPS   Oral   Take 200 mg by mouth daily.         Marland Kitchen VITAMIN D3 1000 UNITS PO CAPS   Oral   Take 1 capsule by mouth daily.          . TYLENOL PM EXTRA STRENGTH PO   Oral   Take 1-2 tablets by mouth 2 (two) times daily as needed. For sleep or pain         . LEVOTHYROXINE SODIUM 175 MCG PO TABS   Oral   Take 175 mcg by mouth as directed. Take 6 days a week. Doesn't take on Saturday         . THERA M PLUS PO TABS   Oral   Take 1 tablet by mouth daily.         . OXYCODONE-ACETAMINOPHEN 5-325 MG PO TABS   Oral   Take 1 tablet by mouth every 4 (four) hours as needed for pain.   20 tablet   0   . PRAVASTATIN SODIUM 20 MG PO TABS   Oral   Take 1 tablet (20 mg total) by mouth daily. Take 1 tablet at bedtime.   90 tablet   1   . WARFARIN SODIUM 5 MG PO TABS   Oral   Take 2.5-5 mg by mouth daily. Take 1 tablet (5 mg) on Sunday,Tuesday,Thursday & Saturday. Take 0.5  (2.5) tablet on Monday, Wednesday and Friday.         . CEPHALEXIN 250 MG PO CAPS   Oral   Take 1 capsule (250 mg total) by mouth 4 (four) times daily.   28 capsule   0   . FUROSEMIDE 40 MG PO TABS   Oral   Take 40 mg by mouth daily as needed. For fluid         . HYDROCODONE-HOMATROPINE 5-1.5 MG/5ML PO SYRP      Take 1 tsp every 6 hrs as needed   60 mL   0     BP 158/76  Pulse 128  Temp 97.8 F (36.6 C) (Oral)  Resp 16  SpO2 98%  Physical Exam  Nursing note and vitals reviewed. Constitutional: She is oriented to person, place, and time. She appears well-developed and well-nourished. No distress.  HENT:  Head: Normocephalic and atraumatic.  Eyes: Conjunctivae normal and EOM are normal. Pupils are equal, round, and reactive  to light.       No nystagmus  Cardiovascular: An irregular rhythm present. Tachycardia present.   Pulmonary/Chest: Effort normal and breath sounds normal. No respiratory distress. She has no wheezes. She has no rales.  Abdominal: Soft.  Musculoskeletal: She exhibits no edema and no tenderness.  Neurological: She is alert and oriented to person, place, and time. She has normal strength. No sensory deficit. Coordination normal.       Normal finger to nose coordination.  Skin: Skin is warm and dry. No rash noted.  Psychiatric: She has a normal mood and affect. Her behavior is normal.    ED Course  Procedures   Results for orders placed during the hospital encounter of 10/08/12  CBC WITH DIFFERENTIAL      Component Value Range   WBC 6.8  4.0 - 10.5 K/uL   RBC 4.82  3.87 - 5.11 MIL/uL   Hemoglobin 13.9  12.0 - 15.0 g/dL   HCT 95.6  21.3 - 08.6 %   MCV 88.8  78.0 - 100.0 fL   MCH 28.8  26.0 - 34.0 pg   MCHC 32.5  30.0 - 36.0 g/dL   RDW 57.8 (*) 46.9 - 62.9 %   Platelets 247  150 - 400 K/uL   Neutrophils Relative 62  43 - 77 %   Neutro Abs 4.2  1.7 - 7.7 K/uL   Lymphocytes Relative 24  12 - 46 %   Lymphs Abs 1.6  0.7 - 4.0 K/uL   Monocytes Relative 12  3 - 12 %   Monocytes Absolute 0.8  0.1 - 1.0 K/uL   Eosinophils Relative 2  0 - 5 %   Eosinophils Absolute 0.1  0.0 - 0.7 K/uL   Basophils Relative 1  0 - 1 %   Basophils Absolute 0.1  0.0 - 0.1 K/uL  BASIC METABOLIC PANEL      Component Value Range   Sodium 139  135 - 145 mEq/L   Potassium 4.0  3.5 - 5.1 mEq/L   Chloride 102  96 - 112 mEq/L   CO2 24  19 - 32 mEq/L   Glucose, Bld 113 (*) 70 - 99 mg/dL   BUN 26 (*) 6 - 23 mg/dL   Creatinine, Ser 5.28 (*) 0.50 - 1.10 mg/dL   Calcium 9.5  8.4 - 41.3 mg/dL   GFR calc non Af Amer 42 (*) >90 mL/min   GFR calc Af Amer 49 (*) >90 mL/min  PROTIME-INR      Component Value Range   Prothrombin Time 24.9 (*) 11.6 - 15.2 seconds   INR 2.38 (*) 0.00 - 1.49    I-STAT troponin    0.01     Dg Chest Portable 1 View  10/08/2012  *RADIOLOGY REPORT*  Clinical Data: Palpitations.  Cough.  PORTABLE CHEST - 1 VIEW  Comparison: 04/10/2012  Findings: The heart size and pulmonary vascularity are normal. The lungs appear clear and expanded without focal air space disease or consolidation. No blunting of the costophrenic angles.  No pneumothorax.  Mediastinal contours appear intact.  Surgical clips in the base of the neck.  Tortuous aorta.  Degenerative changes in the shoulders and spine.  No significant change since previous study.  IMPRESSION: No  evidence of active pulmonary disease.   Original Report Authenticated By: Burman Nieves, M.D.      No diagnosis found.    MDM  8:10 PM patient seen and evaluated. Patient resting comfortably currently continues to feel rapid heart rate. Reports slight SOB at rest. Patient is not appear tachypneic or in any respiratory distress.  Patient seen and discussed with attending physician. 20 mg bolus of Cardizem given without conversion. Cardizem drip initiated.  Dr. Radford Pax spoke with on-call cardiologist, Dr. Shirlee Latch. Recommends giving 150 mg amiodarone and 240 mg by mouth Cardizem. Waiting and reassessing for improvement.  Patient only having slight improvements after medications. Rate has improved slightly to the 1 teens but continues to be irregular.   1:40 AM spoke with Dr. Shirlee Latch again. Patient continues to appear comfortable with slightly elevated heart rate. He recommends an additional 120 mg by mouth Cardizem. He feels patient may be discharged home his heart rate is around 110 or lower. If this is the case the patient should be given prescription for 360 mg Cardizem long acting daily with close cardiology followup in the afternoon.   Patient's heart rate has improved. It now fluctuates between low 100s to 115 range. Patient continues to appear well without significant complaints. She is requesting to return home. At this time  patient will be discharged and planned followup with cardiologist today.     Date: 10/08/2012 19:59  Rate: 135  Rhythm: atrial flutter  QRS Axis: normal  Intervals: QT prolonged  ST/T Wave abnormalities: normal  Conduction Disutrbances:none  Narrative Interpretation:   Old EKG Reviewed: changes noted    Date: 09/23/2012   22L24  Rate: 116  Rhythm: atrial fibrillation  QRS Axis: normal  Intervals: normal  ST/T Wave abnormalities: normal  Conduction Disutrbances:none  Narrative Interpretation:   Old EKG Reviewed: No significant change      Angus Seller, [place] 10/09/12 [phone]

## 2012-10-08 NOTE — ED Provider Notes (Signed)
Discuss case with cardiology(Dr. Jearld Pies from [facility]) who recommended giving her 150 mg of IV amiodarone along with 240 mg of by mouth diltiazem.  Observe for a period of time if she doesn't slow down to acceptable right call back.  Nelia Shi, MD 10/08/12 2251

## 2012-10-08 NOTE — ED Notes (Signed)
Pt remains in A. Fib. Pt continues to deny chest pain. PA Dammen at bedside.

## 2012-10-08 NOTE — ED Notes (Addendum)
Pt denies chest pain. Pt states she has had dizziness since waking up this morning. Pt states she has had palpations since waking this morning. Pt denies nausea. Pt a/o x 3. Skin warm, dry. Pt states she has had a headache today and took some Tylenol, but it didn't go away.

## 2012-10-09 ENCOUNTER — Encounter (HOSPITAL_COMMUNITY): Payer: Self-pay | Admitting: *Deleted

## 2012-10-09 ENCOUNTER — Encounter (HOSPITAL_COMMUNITY)
Admission: AD | Disposition: A | Discharge status: Home / Self Care | Payer: Self-pay | Source: Ambulatory Visit | Attending: Cardiology

## 2012-10-09 ENCOUNTER — Ambulatory Visit (HOSPITAL_COMMUNITY)
Admission: AD | Admit: 2012-10-09 | Discharge: 2012-10-09 | Disposition: A | Discharge status: Home / Self Care | Payer: Medicare Other | Source: Ambulatory Visit | Attending: Cardiology | Admitting: Cardiology

## 2012-10-09 ENCOUNTER — Telehealth: Payer: Self-pay | Admitting: Internal Medicine

## 2012-10-09 ENCOUNTER — Ambulatory Visit (HOSPITAL_COMMUNITY): Payer: Medicare Other | Admitting: *Deleted

## 2012-10-09 ENCOUNTER — Other Ambulatory Visit: Payer: Self-pay | Admitting: *Deleted

## 2012-10-09 DIAGNOSIS — I4891 Unspecified atrial fibrillation: Secondary | ICD-10-CM

## 2012-10-09 HISTORY — PX: CARDIOVERSION: SHX1299

## 2012-10-09 SURGERY — CARDIOVERSION
Anesthesia: General | Wound class: Clean

## 2012-10-09 SURGERY — CARDIOVERSION
Anesthesia: Monitor Anesthesia Care | Wound class: Clean

## 2012-10-09 MED ORDER — DILTIAZEM HCL ER COATED BEADS 360 MG PO CP24: 360.0000 mg | ORAL_CAPSULE | Freq: Every day | ORAL | Status: DC

## 2012-10-09 MED ORDER — SODIUM CHLORIDE 0.9 % IV SOLN
INTRAVENOUS | Status: DC | PRN
  Administered 2012-10-09: 16:00:00 via INTRAVENOUS

## 2012-10-09 MED ORDER — PROPOFOL 10 MG/ML IV BOLUS
INTRAVENOUS | Status: DC | PRN
  Administered 2012-10-09: 50 mg via INTRAVENOUS
  Administered 2012-10-09: 40 mg via INTRAVENOUS

## 2012-10-09 MED ORDER — SODIUM CHLORIDE 0.9 % IV SOLN
INTRAVENOUS | Status: DC
  Administered 2012-10-09: 500 mL via INTRAVENOUS

## 2012-10-09 MED ORDER — DILTIAZEM HCL ER COATED BEADS 120 MG PO CP24
120.0000 mg | ORAL_CAPSULE | Freq: Once | ORAL | Status: AC
  Administered 2012-10-09: 120 mg via ORAL
  Filled 2012-10-09: qty 1

## 2012-10-09 NOTE — Transfer of Care (Signed)
Immediate Anesthesia Transfer of Care Note  Patient: Kayla Harrison  Procedure(s) Performed: Procedure(s) (LRB) with comments: CARDIOVERSION (N/A) Kathlene November gave the ok to add pt to the add on , but we must check to find out if the can add pt on at 1400 ( [id number])  Patient Location: PACU and Endoscopy Unit  Anesthesia Type:General  Level of Consciousness: awake, oriented and patient cooperative  Airway & Oxygen Therapy: Patient Spontanous Breathing and Patient connected to nasal cannula oxygen  Post-op Assessment: Report given to PACU RN and Post -op Vital signs reviewed and stable  Post vital signs: Reviewed and stable  Complications: No apparent anesthesia complications

## 2012-10-09 NOTE — Telephone Encounter (Signed)
New problem:    Seen in er at [facility] long last night  For Afib &  flutter. Did not convert to normal sinus rhythm. Please advise.

## 2012-10-09 NOTE — Preoperative (Signed)
Beta Blockers   Reason not to administer Beta Blockers:Not Applicable 

## 2012-10-09 NOTE — Anesthesia Preprocedure Evaluation (Addendum)
Anesthesia Evaluation  Patient identified by MRN, date of birth, ID band Patient awake    Reviewed: Allergy & Precautions, H&P , NPO status , Patient's Chart, lab work & pertinent test results  Airway Mallampati: III TM Distance: >3 FB Neck ROM: Full    Dental  (+) Teeth Intact and Dental Advisory Given   Pulmonary shortness of breath, pneumonia -, resolved,  breath sounds clear to auscultation        Cardiovascular + dysrhythmias Atrial Fibrillation Rhythm:Irregular Rate:Tachycardia     Neuro/Psych CVA, No Residual Symptoms    GI/Hepatic negative GI ROS, (+) Hepatitis -  Endo/Other    Renal/GU negative Renal ROS     Musculoskeletal  (+) Arthritis -, Osteoarthritis,    Abdominal (+) + obese,   Peds  Hematology negative hematology ROS (+)   Anesthesia Other Findings   Reproductive/Obstetrics                           Anesthesia Physical Anesthesia Plan  ASA: III  Anesthesia Plan: General   Post-op Pain Management:    Induction: Intravenous  Airway Management Planned: Mask  Additional Equipment:   Intra-op Plan:   Post-operative Plan:   Informed Consent: I have reviewed the patients History and Physical, chart, labs and discussed the procedure including the risks, benefits and alternatives for the proposed anesthesia with the patient or authorized representative who has indicated his/her understanding and acceptance.   Dental advisory given  Plan Discussed with: Surgeon and Anesthesiologist  Anesthesia Plan Comments:         Anesthesia Quick Evaluation

## 2012-10-09 NOTE — H&P (Signed)
HPI Kayla Harrison is a 67 y.o. female with a history of atrial fibrillatio for which she underwent pulmonary vein isolation at [facility].  She has a history of prior strokes and remains on oral anticoagulation.  Echo 2011 EF 55% with mod LAE-50mm.  She was in the ER yesterday with palpitations and was found to be in atrial fib flutter.  She was set up for this DCCV after phone conversation with Dr. Johney Frame.  She feels the palpitations but otherwise denies any acute cardiovascular symptoms.      Past Medical History   .  Atrial fibrillation         Status post pulmonary vein isolation   .  Morbid obesity         Status post lap band   .  Hx of thyroid cancer     .  Breast cancer     .  Colon polyp     .  Dyslipidemia     .  Stroke     .  Thyroid disease         CANCER   .  Hyperlipidemia     .  Stroke         Past Surgical History   .  Thyroidectomy  1998   .  BSO 1998   .  Cholecystectomy     .  Tonsillectomy     .  Colonoscopy w/ polypectomy     .  Afib ablation     .  Abdominal hysterectomy  1983   .  Breast lumpectomy  2010   .  Hernia repair     .  Laparoscopic gastric banding  07/10/2010   .  Appendectomy         Prior to Admission medications   Medication Sig Start Date End Date Taking? Authorizing Provider  amiodarone (PACERONE) 200 MG tablet Take 200 mg by mouth See admin instructions. mon-fri only doesn't take on Saturday or sunday 01/24/12  Yes [facility] Salvia, MD  Calcium Citrate-Vitamin D (CALCIUM CITRATE + PO) Take 1 tablet by mouth daily.    Yes Historical Provider, MD  celecoxib (CELEBREX) 200 MG capsule Take 200 mg by mouth daily.   Yes Historical Provider, MD  Diphenhydramine-APAP, sleep, (TYLENOL PM EXTRA STRENGTH PO) Take 1-2 tablets by mouth 2 (two) times daily as needed. For sleep or pain   Yes Historical Provider, MD  levothyroxine (SYNTHROID, LEVOTHROID) 175 MCG tablet Take 175 mcg by mouth as directed. Take 6 days a week. Doesn't take on Saturday    Yes Historical Provider, MD  Multiple Vitamins-Minerals (MULTIVITAMINS THER. W/MINERALS) TABS Take 1 tablet by mouth daily.   Yes Historical Provider, MD  oxyCODONE-acetaminophen (PERCOCET) 5-325 MG per tablet Take 1 tablet by mouth every 4 (four) hours as needed for pain. 09/10/12  Yes Flint Melter, MD  pravastatin (PRAVACHOL) 20 MG tablet LABS OVERDUE, 1 by mouth daily 10/09/12  Yes Pecola Lawless, MD  warfarin (COUMADIN) 5 MG tablet Take 2.5-5 mg by mouth daily. Take 1 tablet (5 mg) on Sunday,Tuesday,Thursday & Saturday. Take 0.5  (2.5) tablet on Monday, Wednesday and Friday. 01/23/12  Yes [facility] Salvia, MD  cephALEXin (KEFLEX) 250 MG capsule Take 1 capsule (250 mg total) by mouth 4 (four) times daily. 09/10/12   Flint Melter, MD  Cholecalciferol (VITAMIN D3) 1000 UNITS CAPS Take 1 capsule by mouth daily.     Historical Provider, MD  diltiazem (CARDIZEM CD) 360 MG  24 hr capsule Take 1 capsule (360 mg total) by mouth daily. 10/09/12   Angus Seller, PA  furosemide (LASIX) 40 MG tablet Take 40 mg by mouth daily as needed. For fluid    Historical Provider, MD  HYDROcodone-homatropine (HYDROMET) 5-1.5 MG/5ML syrup Take 1 tsp every 6 hrs as needed 09/24/12   Pecola Lawless, MD       Allergies   .  Epinephrine  Other (See Comments)       Dental form only (liquid). Patient stated she will become "out of it" she can hear you but cannot respond in normal fashion.     Review of Systems negative except from HPI and PMH   Physical Exam BP 131/85  Pulse 122  Temp 98.2 F (36.8 C) (Oral)  Resp 12  Ht 5\' 6"  (1.676 m)  Wt 215 lb (97.523 kg)  BMI 34.70 kg/m2  SpO2 98% GENERAL:  Well appearing HEENT:  Pupils equal round and reactive, fundi not visualized, oral mucosa unremarkable LUNGS:  Clear to auscultation bilaterally CHEST:  Unremarkable HEART:  PMI not displaced or sustained,S1 and S2 within normal limits, no S3, no S4, no clicks, no rubs, no murmurs ABD:  Flat, positive bowel sounds  normal in frequency in pitch, no bruits, no rebound, no guarding, no midline pulsatile mass, no hepatomegaly, no splenomegaly EXT:  2 plus pulses throughout, no edema, no cyanosis no clubbing     ATRIAL FIBRILLATION - She presents for DCCV with symptomatic atrial fib.  She has been on therapeutic warfarin.

## 2012-10-09 NOTE — Telephone Encounter (Signed)
I spoke with Aurther Loft in Endo. The patient is ok for DCCV today with anesthesia. I have notified her and she will report to [facility] short stay no later than 3:00 pm. Trish aware, Charmaine aware. Orders entered.

## 2012-10-09 NOTE — Anesthesia Postprocedure Evaluation (Signed)
  Anesthesia Post-op Note  Patient: Kayla Harrison  Procedure(s) Performed: Procedure(s) (LRB) with comments: CARDIOVERSION (N/A) Kathlene November gave the ok to add pt to the add on , but we must check to find out if the can add pt on at 1400 ( [id number])  Patient Location: PACU and Endoscopy Unit  Anesthesia Type:General  Level of Consciousness: awake, oriented and patient cooperative  Airway and Oxygen Therapy: Patient Spontanous Breathing and Patient connected to nasal cannula oxygen  Post-op Pain: none  Post-op Assessment: Post-op Vital signs reviewed, Patient's Cardiovascular Status Stable and Respiratory Function Stable  Post-op Vital Signs: Reviewed and stable  Complications: No apparent anesthesia complications

## 2012-10-09 NOTE — CV Procedure (Signed)
   Procedure:  DCCV  Indication:  Symptomatic atrial fibrillation/flutter  Procedure note:  The patient signed informed consent.  Airway protection was per anesthesia.  She was sedated with 90 mg of Propofol.  Labs were stable and INR was therapeutic today and verified for recent and previous INRs above 2.  She had DCCV x 1 with 120 J of biphasic, synchronized energy.  She converted to NSR with PACs.  There were no complications.    Plan:  Discharge.  The office will call for follow up.  Continue previous medications.

## 2012-10-09 NOTE — Telephone Encounter (Signed)
I spoke with the patient. She states she was in the ER last night at [facility]. Her HR's were in the 120's with reports of a-fib/flutter. She does complain of SOB with movement. She states she has a headache. She feels that her rates are still elevated. I will review with Dr. Elease Hashimoto (DOD)/ Dr. Johney Frame to see what we need to do regarding the patient's rate and rhythm. I will call her back. She is agreeable.

## 2012-10-09 NOTE — ED Provider Notes (Signed)
Medical screening examination/treatment/procedure(s) were conducted as a shared visit with non-physician practitioner(s) and myself.  I personally evaluated the patient during the encounter    .Face to face Exam:  General:  A&Ox3 HEENT:  Atraumatic Resp:  Normal effort Abd:  Nondistended Neuro:No focal deficits   CRITICAL CARE Performed by: Nelva Nay L   Total critical care time: 30 min  Critical care time was exclusive of separately billable procedures and treating other patients.  Critical care was necessary to treat or prevent imminent or life-threatening deterioration.  Critical care was time spent personally by me on the following activities: development of treatment plan with patient and/or surrogate as well as nursing, discussions with consultants, evaluation of patient's response to treatment, examination of patient, obtaining history from patient or surrogate, ordering and performing treatments and interventions, ordering and review of laboratory studies, ordering and review of radiographic studies, pulse oximetry and re-evaluation of patient's condition.   Nelia Shi, MD 10/09/12 2035

## 2012-10-09 NOTE — Telephone Encounter (Signed)
I spoke with Dr. Johney Frame who reviewed the patient's records. He recommends DCCV today. The patient is agreeable with this plan. I have spoken with Sue Lush in Endo. The patient is currently on an add on list at 4:00 pm today for DCCV with Dr. Antoine Poche. Sue Lush will follow up with anesthesia at 2:00 pm to confirm. She will let me know after that if the patient will go for DCCV. The patient is aware to remain NPO. I will call her back. The patient verbalizes understanding.

## 2012-10-12 ENCOUNTER — Encounter (HOSPITAL_COMMUNITY): Payer: Self-pay | Admitting: Cardiology

## 2012-10-13 ENCOUNTER — Other Ambulatory Visit: Payer: Medicare Other

## 2012-10-13 ENCOUNTER — Encounter (HOSPITAL_COMMUNITY): Payer: Self-pay | Admitting: Cardiology

## 2012-10-13 ENCOUNTER — Other Ambulatory Visit: Payer: Self-pay | Admitting: *Deleted

## 2012-10-13 DIAGNOSIS — I6350 Cerebral infarction due to unspecified occlusion or stenosis of unspecified cerebral artery: Secondary | ICD-10-CM

## 2012-10-13 DIAGNOSIS — H25019 Cortical age-related cataract, unspecified eye: Secondary | ICD-10-CM | POA: Diagnosis not present

## 2012-10-13 DIAGNOSIS — I5032 Chronic diastolic (congestive) heart failure: Secondary | ICD-10-CM

## 2012-10-13 DIAGNOSIS — E782 Mixed hyperlipidemia: Secondary | ICD-10-CM

## 2012-10-13 DIAGNOSIS — Z7901 Long term (current) use of anticoagulants: Secondary | ICD-10-CM

## 2012-10-13 DIAGNOSIS — I4891 Unspecified atrial fibrillation: Secondary | ICD-10-CM

## 2012-10-13 DIAGNOSIS — H2510 Age-related nuclear cataract, unspecified eye: Secondary | ICD-10-CM | POA: Diagnosis not present

## 2012-10-16 ENCOUNTER — Telehealth: Payer: Self-pay | Admitting: Internal Medicine

## 2012-10-16 NOTE — Telephone Encounter (Signed)
Pt [phone], in a-fib this am, happened last Friday and had to be cardioverted, pls advise

## 2012-10-16 NOTE — Telephone Encounter (Signed)
Called to speak with pt however "mother" states she will have her call back.  Reviewed information with Dr Antoine Poche would like for the pt to report to the ED for further evaluation.

## 2012-10-26 ENCOUNTER — Telehealth: Payer: Self-pay | Admitting: Internal Medicine

## 2012-10-26 NOTE — Telephone Encounter (Signed)
Pt called because she had a cardioversion two weeks ago (1/17)  She had an episode of atrial fib around 1/21 at which time she spontaneously converted on her own.  On 1/24, she had another episode of afib all night.  She converted later in the day (lasted about 10 hrs).  She has had some short (1-2 hrs) bursts of afib.  She woke up yesterday in afib and converted early this morning.  She is concerned about having all these episodes of severe afib. after not having any for 4 years.  She is requesting an appt with Dr Graciela Husbands asap.

## 2012-10-26 NOTE — Telephone Encounter (Signed)
Pt in and out of a-fib wants sooner appt than 3-20, offered 2-12, not soon enough, pls call  [phone]

## 2012-10-27 NOTE — Telephone Encounter (Signed)
Will forward to [employer] .

## 2012-10-27 NOTE — Telephone Encounter (Signed)
The patient has an appointment on 2/6 with Dr. Graciela Husbands.

## 2012-10-28 ENCOUNTER — Ambulatory Visit (INDEPENDENT_AMBULATORY_CARE_PROVIDER_SITE_OTHER): Payer: Medicare Other | Admitting: Internal Medicine

## 2012-10-28 ENCOUNTER — Encounter: Payer: Self-pay | Admitting: Internal Medicine

## 2012-10-28 VITALS — BP 120/70 | HR 84 | Ht 66.0 in | Wt 234.0 lb

## 2012-10-28 DIAGNOSIS — I5032 Chronic diastolic (congestive) heart failure: Secondary | ICD-10-CM

## 2012-10-28 DIAGNOSIS — I4891 Unspecified atrial fibrillation: Secondary | ICD-10-CM

## 2012-10-28 MED ORDER — AMIODARONE HCL 200 MG PO TABS: ORAL_TABLET | ORAL | Status: DC

## 2012-10-28 NOTE — Assessment & Plan Note (Addendum)
  SHe is euvolemic. We will continue her current medications

## 2012-10-28 NOTE — Patient Instructions (Addendum)
Your physician recommends that you schedule a follow-up appointment in: we will let you know after the echo results come back  Your physician has requested that you have an echocardiogram. Echocardiography is a painless test that uses sound waves to create images of your heart. It provides your doctor with information about the size and shape of your heart and how well your heart's chambers and valves are working. This procedure takes approximately one hour. There are no restrictions for this procedure.  Your physician has recommended you make the following change in your medication: INCREASE Amiodarone to 400 mg twice daily for 2 weeks then once daily for 2 weeks then 200 mg daily

## 2012-10-28 NOTE — Progress Notes (Signed)
Patient Care Team: William F Hopper, MD as PCP - General   HPI  Kayla Harrison is a 66 y.o. female Seen in followup for atrial fibrillation  for which she underwent pulmonary vein isolation at [facility] 2009 She has a history of prior strokes and remains on oral anticoagulation . Echo 2011 EF 55% with mod LAE-40mm  She also has a history of morbid obesity status post lap band surgery    She comes in now with complaints of recurrent and increasingly frequent episodes of atrial fibrillation.  They're associated with more lightheadedness than before. She has been on amiodarone for that and said she had recurrent symptoms at the time of her pneumonia in 2011. It is my recollection that she was on amiodarone prior to her PVI; she is not sure. We'll need to review the old record.   Past Medical History  Diagnosis Date  . Morbid obesity     Status post lap band surgery  . Hx of thyroid cancer     Dr South  . Colon polyp     Dr Medoff  . Dyslipidemia   . Hyperlipidemia   . Hypothyroidism   . Breast cancer     Dr Streck, total thyroidectomy- 1999- for cancer  . Pneumonia     2010- hosp. here [facility]- during that event had to be cardioverted   . Arthritis     osteoarthritis - knees   . Normal cardiac stress test     2008- cardiac stress/echo  . Stroke     2003- [place]   . Atrial fibrillation     ablation- 2x's-- 1st time- [facility], 2nd event at [facility] in 2008. Dr. Klein- [place]- sees pt. once /yr. ,  . Complication of anesthesia     Ketamine produces LSD reaction, bright colored nightmarish experience   . Hepatitis     [place]itis     Past Surgical History  Procedure Date  . Thyroidectomy 1998    Dr Streck  . Bso 1998  . Cholecystectomy   . Tonsillectomy   . Colonoscopy w/ polypectomy     Dr Medoff  . Afib ablation     X 2; [facility] & Dr Klein  . Abdominal hysterectomy 1983  . Breast lumpectomy 2010  . Laparoscopic gastric  banding 07/10/2010  . Appendectomy   . Knee arthroscopy     bilateral  . Laparotomy     for ruptured ovary and ovarian artery   . Total knee arthroplasty 04/13/2012    Procedure: TOTAL KNEE ARTHROPLASTY;  Surgeon: Stephen D Lucey, MD;  Location: [facility] OR;  Service: Orthopedics;  Laterality: Right;  . Joint replacement 04/13/12    Right Knee replacement  . Cardioversion 10/09/2012    Procedure: CARDIOVERSION;  Surgeon: James Hochrein, MD;  Location: [facility] OR;  Service: Cardiovascular;  Laterality: N/A;  . Cardioversion 10/09/2012    Procedure: CARDIOVERSION;  Surgeon: James Hochrein, MD;  Location: [facility] ENDOSCOPY;  Service: Cardiovascular;  Laterality: N/A;  Mike gave the ok to add pt to the add on , but we must check to find out if the can add pt on at 1400 ( [id number])    Current Outpatient Prescriptions  Medication Sig Dispense Refill  . amiodarone (PACERONE) 200 MG tablet Take 200 mg by mouth See admin instructions. mon-fri only doesn't take on Saturday or sunday      . Calcium Citrate-Vitamin D (CALCIUM CITRATE + PO) Take 1 tablet by   mouth daily.       . Cholecalciferol (VITAMIN D3) 1000 UNITS CAPS Take 1 capsule by mouth daily.       . Diphenhydramine-APAP, sleep, (TYLENOL PM EXTRA STRENGTH PO) Take 1-2 tablets by mouth 2 (two) times daily as needed. For sleep or pain      . furosemide (LASIX) 40 MG tablet Take 40 mg by mouth daily as needed. For fluid      . levothyroxine (SYNTHROID, LEVOTHROID) 175 MCG tablet Take 175 mcg by mouth as directed. Take 6 days a week. Doesn't take on Saturday      . Multiple Vitamins-Minerals (MULTIVITAMINS THER. W/MINERALS) TABS Take 1 tablet by mouth daily.      . pravastatin (PRAVACHOL) 20 MG tablet LABS OVERDUE, 1 by mouth daily  30 tablet  0  . warfarin (COUMADIN) 5 MG tablet Take 2.5-5 mg by mouth daily. Take 1 tablet (5 mg) on Sunday,Tuesday,Thursday & Saturday. Take 0.5  (2.5) tablet on Monday, Wednesday and Friday.        Allergies  Allergen Reactions  .  Xarelto (Rivaroxaban)     Nose Bleed X 6 hrs ; packing in ER  . Epinephrine Other (See Comments)    Dental form only (liquid). Patient stated she will become "out of it" she can hear you but cannot respond in normal fashion.    Review of Systems negative except from HPI and PMH  Physical Exam BP 134/68  Pulse 69  Ht 5' 6" (1.676 m)  Wt 234 lb (106.142 kg)  BMI 37.77 kg/m2 Well developed and well nourished in no acute distress HENT normal E scleral and icterus clear Neck Supple JVP flat; carotids brisk and full Clear to ausculation  Regular rate and rhythm, no murmurs gallops or rub Soft with active bowel sounds No clubbing cyanosis none Edema Alert and oriented, grossly normal motor and sensory function Skin Warm and Dry  ECG demonstrates sinus rhythm at 63 Intervals 19/10/45 Axis is 26  Assessment and  Plan  

## 2012-10-28 NOTE — Assessment & Plan Note (Signed)
Recurrent atrial fibrillation with increasing frequency and increasing symptoms. I will need to go back to review the old chart as relates to her amiodarone prior to her ablation. We'll increase the amiodarone for a  now from 1000 mg a week to 400 mg twice daily for 2 weeks then 400 mg daily for 2 weeks and then 200 mg a day  We will obtain an echo to look at left atrial dimension.

## 2012-10-28 NOTE — Assessment & Plan Note (Signed)
lighheadedness with atriar arrhythmia

## 2012-10-29 ENCOUNTER — Other Ambulatory Visit: Payer: Self-pay | Admitting: Internal Medicine

## 2012-10-29 ENCOUNTER — Ambulatory Visit: Payer: Medicare Other | Admitting: Internal Medicine

## 2012-11-03 ENCOUNTER — Other Ambulatory Visit (HOSPITAL_COMMUNITY): Payer: Medicare Other

## 2012-11-04 ENCOUNTER — Other Ambulatory Visit (HOSPITAL_COMMUNITY): Payer: Medicare Other

## 2012-11-04 ENCOUNTER — Ambulatory Visit (HOSPITAL_COMMUNITY): Payer: Medicare Other | Attending: Internal Medicine | Admitting: Radiology

## 2012-11-04 ENCOUNTER — Ambulatory Visit (INDEPENDENT_AMBULATORY_CARE_PROVIDER_SITE_OTHER): Payer: Medicare Other | Admitting: *Deleted

## 2012-11-04 DIAGNOSIS — R42 Dizziness and giddiness: Secondary | ICD-10-CM | POA: Diagnosis not present

## 2012-11-04 DIAGNOSIS — E785 Hyperlipidemia, unspecified: Secondary | ICD-10-CM | POA: Insufficient documentation

## 2012-11-04 DIAGNOSIS — Z7901 Long term (current) use of anticoagulants: Secondary | ICD-10-CM | POA: Diagnosis not present

## 2012-11-04 DIAGNOSIS — I6350 Cerebral infarction due to unspecified occlusion or stenosis of unspecified cerebral artery: Secondary | ICD-10-CM | POA: Diagnosis not present

## 2012-11-04 DIAGNOSIS — I059 Rheumatic mitral valve disease, unspecified: Secondary | ICD-10-CM | POA: Insufficient documentation

## 2012-11-04 DIAGNOSIS — R55 Syncope and collapse: Secondary | ICD-10-CM | POA: Insufficient documentation

## 2012-11-04 DIAGNOSIS — Z853 Personal history of malignant neoplasm of breast: Secondary | ICD-10-CM | POA: Diagnosis not present

## 2012-11-04 DIAGNOSIS — I4891 Unspecified atrial fibrillation: Secondary | ICD-10-CM | POA: Insufficient documentation

## 2012-11-04 DIAGNOSIS — I079 Rheumatic tricuspid valve disease, unspecified: Secondary | ICD-10-CM | POA: Diagnosis not present

## 2012-11-04 DIAGNOSIS — Z8673 Personal history of transient ischemic attack (TIA), and cerebral infarction without residual deficits: Secondary | ICD-10-CM | POA: Diagnosis not present

## 2012-11-04 DIAGNOSIS — E039 Hypothyroidism, unspecified: Secondary | ICD-10-CM | POA: Diagnosis not present

## 2012-11-04 DIAGNOSIS — I509 Heart failure, unspecified: Secondary | ICD-10-CM | POA: Diagnosis not present

## 2012-11-04 NOTE — Progress Notes (Signed)
Echocardiogram performed.  

## 2012-11-08 ENCOUNTER — Emergency Department (HOSPITAL_COMMUNITY)
Admission: EM | Admit: 2012-11-08 | Discharge: 2012-11-08 | Disposition: A | Discharge status: Home / Self Care | Payer: Medicare Other | Attending: Emergency Medicine | Admitting: Emergency Medicine

## 2012-11-08 ENCOUNTER — Encounter (HOSPITAL_COMMUNITY): Payer: Self-pay | Admitting: Emergency Medicine

## 2012-11-08 ENCOUNTER — Emergency Department (HOSPITAL_COMMUNITY): Payer: Medicare Other

## 2012-11-08 DIAGNOSIS — E785 Hyperlipidemia, unspecified: Secondary | ICD-10-CM | POA: Diagnosis not present

## 2012-11-08 DIAGNOSIS — R0602 Shortness of breath: Secondary | ICD-10-CM | POA: Insufficient documentation

## 2012-11-08 DIAGNOSIS — E039 Hypothyroidism, unspecified: Secondary | ICD-10-CM | POA: Insufficient documentation

## 2012-11-08 DIAGNOSIS — Z8673 Personal history of transient ischemic attack (TIA), and cerebral infarction without residual deficits: Secondary | ICD-10-CM | POA: Diagnosis not present

## 2012-11-08 DIAGNOSIS — Z8719 Personal history of other diseases of the digestive system: Secondary | ICD-10-CM | POA: Diagnosis not present

## 2012-11-08 DIAGNOSIS — R05 Cough: Secondary | ICD-10-CM | POA: Diagnosis not present

## 2012-11-08 DIAGNOSIS — Z79899 Other long term (current) drug therapy: Secondary | ICD-10-CM | POA: Diagnosis not present

## 2012-11-08 DIAGNOSIS — Z860100 Personal history of colon polyps, unspecified: Secondary | ICD-10-CM | POA: Insufficient documentation

## 2012-11-08 DIAGNOSIS — Z853 Personal history of malignant neoplasm of breast: Secondary | ICD-10-CM | POA: Diagnosis not present

## 2012-11-08 DIAGNOSIS — IMO0002 Reserved for concepts with insufficient information to code with codable children: Secondary | ICD-10-CM | POA: Insufficient documentation

## 2012-11-08 DIAGNOSIS — Z9884 Bariatric surgery status: Secondary | ICD-10-CM | POA: Insufficient documentation

## 2012-11-08 DIAGNOSIS — Z7901 Long term (current) use of anticoagulants: Secondary | ICD-10-CM | POA: Insufficient documentation

## 2012-11-08 DIAGNOSIS — Z85850 Personal history of malignant neoplasm of thyroid: Secondary | ICD-10-CM | POA: Diagnosis not present

## 2012-11-08 DIAGNOSIS — Z9889 Other specified postprocedural states: Secondary | ICD-10-CM | POA: Insufficient documentation

## 2012-11-08 DIAGNOSIS — Z8701 Personal history of pneumonia (recurrent): Secondary | ICD-10-CM | POA: Diagnosis not present

## 2012-11-08 DIAGNOSIS — M1710 Unilateral primary osteoarthritis, unspecified knee: Secondary | ICD-10-CM | POA: Insufficient documentation

## 2012-11-08 DIAGNOSIS — R42 Dizziness and giddiness: Secondary | ICD-10-CM | POA: Insufficient documentation

## 2012-11-08 DIAGNOSIS — I4891 Unspecified atrial fibrillation: Secondary | ICD-10-CM | POA: Diagnosis not present

## 2012-11-08 LAB — CBC WITH DIFFERENTIAL/PLATELET
Eosinophils Absolute: 0.2 10*3/uL (ref 0.0–0.7)
Eosinophils Relative: 3 % (ref 0–5)
Hemoglobin: 13.7 g/dL (ref 12.0–15.0)
Lymphs Abs: 1.4 10*3/uL (ref 0.7–4.0)
MCH: 29.6 pg (ref 26.0–34.0)
MCV: 90.7 fL (ref 78.0–100.0)
Monocytes Relative: 11 % (ref 3–12)
RBC: 4.63 MIL/uL (ref 3.87–5.11)

## 2012-11-08 LAB — BASIC METABOLIC PANEL
BUN: 19 mg/dL (ref 6–23)
Calcium: 9 mg/dL (ref 8.4–10.5)
GFR calc non Af Amer: 44 mL/min — ABNORMAL LOW (ref >90)
Glucose, Bld: 136 mg/dL — ABNORMAL HIGH (ref 70–99)

## 2012-11-08 MED ORDER — DILTIAZEM HCL ER COATED BEADS 120 MG PO CP24: 120.0000 mg | ORAL_CAPSULE | Freq: Every day | ORAL | Status: DC

## 2012-11-08 MED ORDER — DILTIAZEM HCL 60 MG PO TABS
120.0000 mg | ORAL_TABLET | Freq: Once | ORAL | Status: AC
  Administered 2012-11-08: 120 mg via ORAL
  Filled 2012-11-08: qty 2

## 2012-11-08 NOTE — ED Provider Notes (Signed)
History     CSN: [id number]  Arrival date & time 11/08/12  1739   First MD Initiated Contact with Patient 11/08/12 1947      No chief complaint on file.   (Consider location/radiation/quality/duration/timing/severity/associated sxs/prior treatment) HPI Pt with history of paroxysmal atrial fibrillation reports she felt onset of afib earlier today with some rapid rate. She has tried various vagal maneuvers at home without improvement. She was concerned that her BP was in the 115/90 range (never seen that narrow pulse pressure). She reports some SOB and dizziness but no syncope. She had an admission for similar about a month ago which required electrical cardioversion. Denies CP, no cough and no fever. She is taking Amiodarone 400mg  BID as part of a taper. Not currently on any calcium channel blockers.   Past Medical History  Diagnosis Date  . Morbid obesity     Status post lap band surgery  . Hx of thyroid cancer     Dr Evlyn Kanner  . Colon polyp     Dr Kinnie Scales  . Dyslipidemia   . Hyperlipidemia   . Hypothyroidism   . Breast cancer     Dr Jamey Ripa, total thyroidectomy- 1999- for cancer  . Pneumonia     2010- hosp. here [facility]- during that event had to be cardioverted   . Arthritis     osteoarthritis - knees   . Normal cardiac stress test     2008- cardiac stress/echo  . Stroke     2003- [place]   . Atrial fibrillation     ablation- 2x's-- 1st time- [facility], 2nd event at [facility] in 2008. Dr. Graciela Husbands- [place]- sees pt. once /yr. ,  . Complication of anesthesia     Ketamine produces LSD reaction, bright colored nightmarish experience   . Hepatitis     [place] ?hepatitis     Past Surgical History  Procedure Laterality Date  . Thyroidectomy  1998    Dr Jamey Ripa  . Bso  1998  . Cholecystectomy    . Tonsillectomy    . Colonoscopy w/ polypectomy      Dr Kinnie Scales  . Afib ablation      X 2; [facility] & Dr Graciela Husbands  . Abdominal hysterectomy  1983  . Breast  lumpectomy  2010  . Laparoscopic gastric banding  07/10/2010  . Appendectomy    . Knee arthroscopy      bilateral  . Laparotomy      for ruptured ovary and ovarian artery   . Total knee arthroplasty  04/13/2012    Procedure: TOTAL KNEE ARTHROPLASTY;  Surgeon: Raymon Mutton, MD;  Location: [facility] OR;  Service: Orthopedics;  Laterality: Right;  . Joint replacement  04/13/12    Right Knee replacement  . Cardioversion  10/09/2012    Procedure: CARDIOVERSION;  Surgeon: Rollene Rotunda, MD;  Location: [facility] OR;  Service: Cardiovascular;  Laterality: N/A;  . Cardioversion  10/09/2012    Procedure: CARDIOVERSION;  Surgeon: Rollene Rotunda, MD;  Location: [facility] ENDOSCOPY;  Service: Cardiovascular;  Laterality: N/A;  Kathlene November gave the ok to add pt to the add on , but we must check to find out if the can add pt on at [phone] ( [phone])    Family History  Problem Relation Age of Onset  . Cancer Father     COLON  . Heart disease Father 62    MI @autopsy   . Diabetes Sister   . Diabetes Brother   . Diabetes  Paternal Aunt   . Diabetes Paternal Grandmother     History  Substance Use Topics  . Smoking status: Never Smoker   . Smokeless tobacco: Never Used  . Alcohol Use: No    OB History   Grav Para Term Preterm Abortions TAB SAB Ect Mult Living                  Review of Systems All other systems reviewed and are negative except as noted in HPI.   Allergies  Xarelto and Epinephrine  Home Medications   Current Outpatient Rx  Name  Route  Sig  Dispense  Refill  . amiodarone (PACERONE) 200 MG tablet      Take 400 mg twice daily for 2 weeks then once daily for 2 weeks then reduce to 200 mg daily   42 tablet   0   . Calcium Citrate-Vitamin D (CALCIUM CITRATE + PO)   Oral   Take 1 tablet by mouth daily.          . Cholecalciferol (VITAMIN D3) 1000 UNITS CAPS   Oral   Take 1 capsule by mouth daily.          . Diphenhydramine-APAP, sleep, (TYLENOL PM EXTRA STRENGTH PO)   Oral   Take 1-2  tablets by mouth 2 (two) times daily as needed. For sleep or pain         . furosemide (LASIX) 40 MG tablet   Oral   Take 40 mg by mouth daily as needed. For fluid         . levothyroxine (SYNTHROID, LEVOTHROID) 175 MCG tablet   Oral   Take 175 mcg by mouth as directed. Take 6 days a week. Doesn't take on Saturday         . Multiple Vitamins-Minerals (MULTIVITAMINS THER. W/MINERALS) TABS   Oral   Take 1 tablet by mouth daily.         . pravastatin (PRAVACHOL) 20 MG tablet   Oral   Take 20 mg by mouth every evening.         . warfarin (COUMADIN) 5 MG tablet   Oral   Take 2.5-5 mg by mouth See admin instructions. Coumadin 1 tab (5mg ) on MWF and 0.5 tab (2.5mg ) other days of the week.           BP 139/95  Pulse 121  Temp(Src) 97.7 F (36.5 C)  Resp 20  SpO2 100%  Physical Exam  Nursing note and vitals reviewed. Constitutional: She is oriented to person, place, and time. She appears well-developed and well-nourished.  HENT:  Head: Normocephalic and atraumatic.  Eyes: EOM are normal. Pupils are equal, round, and reactive to light.  Neck: Normal range of motion. Neck supple.  Cardiovascular: Normal heart sounds and intact distal pulses.   Tachy and irregular  Pulmonary/Chest: Effort normal and breath sounds normal.  Abdominal: Bowel sounds are normal. She exhibits no distension. There is no tenderness.  Musculoskeletal: Normal range of motion. She exhibits no edema and no tenderness.  Neurological: She is alert and oriented to person, place, and time. She has normal strength. No cranial nerve deficit or sensory deficit.  Skin: Skin is warm and dry. No rash noted.  Psychiatric: She has a normal mood and affect.    ED Course  Procedures (including critical care time)  Labs Reviewed  BASIC METABOLIC PANEL - Abnormal; Notable for the following:    Glucose, Bld 136 (*)    Creatinine, Ser 1.23 (*)  GFR calc non Af Amer 44 (*)    GFR calc Af Amer 51 (*)     All other components within normal limits  CBC WITH DIFFERENTIAL  TROPONIN I   Dg Chest Portable 1 View  11/08/2012  *RADIOLOGY REPORT*  Clinical Data: Short of breath, cough, fatigue  PORTABLE CHEST - 1 VIEW  Comparison: Prior chest x-ray 10/08/2012  Findings: Subtle patchy opacity in the right lung base appears new compared to 10/08/2012.  Cardiac and mediastinal contours are within normal limits.  The lungs are otherwise clear.  No pleural effusion, pneumothorax or pulmonary vascular congestion.  Surgical clips noted in the region of the thyroid.  No acute osseous abnormality.  IMPRESSION: Subtle patchy opacity in the right lung base may reflect atelectasis or developing infiltrate.   Original Report Authenticated By: Malachy Moan, M.D.      No diagnosis found.    MDM   Date: 11/08/2012  Rate: 114  Rhythm: atrial fibrillation and RVR  QRS Axis: normal  Intervals: normal  ST/T Wave abnormalities: nonspecific T wave changes  Conduction Disutrbances:none  Narrative Interpretation:   Old EKG Reviewed: unchanged  Pt's rate is relatively well controlled in the 120 range. Discussed with on call Cardiologist at [facility]) who recommends oral diltiazem dose. Pt does not want to stay in the ED to see if it helps rate and is comfortable going home. She will follow up with Dr. Graciela Husbands tomorrow morning for re-assessment. Advised to return to the ED for any worsening symptoms or for any other concerns.         Charles B. Bernette Mayers, MD 11/08/12 2041

## 2012-11-08 NOTE — ED Notes (Signed)
Portable Xray done.

## 2012-11-08 NOTE — ED Notes (Addendum)
Reports got up and could tell in A. Fib with SOB change in bp. Last episode 3 weeks ago treated @ [facility] & had a cardioversion??.   General appearance is a patient no acute distress.Placed on cardiac monitor showing A. Fib 120. Respiration are even-non laborous, skin is cool & dry. Due to present history went ahead & established saline lock & drew blood.

## 2012-11-09 ENCOUNTER — Telehealth: Payer: Self-pay | Admitting: Internal Medicine

## 2012-11-09 NOTE — Telephone Encounter (Signed)
Spoke with Dr Fawn Kirk he is willing to see pt with LA dimension of 72  M can you please schedule her to see him for new eval sooner rather than later but [facility] Thanks steve  Spoke with pt  She will take dilt 120 bid and let us know later this week how she is feeling

## 2012-11-09 NOTE — Telephone Encounter (Signed)
Message sent to Dr. Graciela Husbands for him to call me.

## 2012-11-09 NOTE — Telephone Encounter (Signed)
Pt was seen in [facility] last night for a-fib and she is still in a-fib and needs to know what to do

## 2012-11-09 NOTE — Telephone Encounter (Signed)
I spoke with Dr. Graciela Husbands and reviewed the patient's LA dimension with him. He will review with Dr. Johney Frame. He has stated he will call the patient back today. I have notified the patient and she is agreeable.

## 2012-11-12 ENCOUNTER — Telehealth: Payer: Self-pay | Admitting: Internal Medicine

## 2012-11-12 NOTE — Telephone Encounter (Signed)
pt talked with dr Graciela Husbands and was told someone would call  her this week to make an appt with dr Johney Frame, I have not seen such a message, I gave her 11-18-12, told her that's his next day he's here, wants to talk to nurse to see what she should do?

## 2012-11-12 NOTE — Telephone Encounter (Signed)
Spoke with patient and she is aware of her appointment and I also let her know it would be March before the ablation can be scheduled.   She seemed concerned but I let her know I would put her on a cancellation list

## 2012-11-13 NOTE — Telephone Encounter (Signed)
Per Dennis Bast, RN, she spoke with the patient yesterday. She is still not feeling well with her a-fib. Tresa Endo did discuss this with her and has scheduled her to see Dr. Johney Frame for an appointment on 2/26 for possible consideration of a-fib ablation.

## 2012-11-18 ENCOUNTER — Other Ambulatory Visit: Payer: Self-pay | Admitting: *Deleted

## 2012-11-18 ENCOUNTER — Encounter: Payer: Self-pay | Admitting: *Deleted

## 2012-11-18 ENCOUNTER — Ambulatory Visit (INDEPENDENT_AMBULATORY_CARE_PROVIDER_SITE_OTHER): Payer: Medicare Other | Admitting: Internal Medicine

## 2012-11-18 ENCOUNTER — Ambulatory Visit (INDEPENDENT_AMBULATORY_CARE_PROVIDER_SITE_OTHER): Payer: Medicare Other | Admitting: *Deleted

## 2012-11-18 ENCOUNTER — Encounter (HOSPITAL_COMMUNITY): Payer: Self-pay | Admitting: Pharmacy Technician

## 2012-11-18 ENCOUNTER — Encounter: Payer: Self-pay | Admitting: Internal Medicine

## 2012-11-18 VITALS — BP 131/64 | HR 112 | Ht 66.0 in | Wt 237.4 lb

## 2012-11-18 DIAGNOSIS — I6350 Cerebral infarction due to unspecified occlusion or stenosis of unspecified cerebral artery: Secondary | ICD-10-CM

## 2012-11-18 DIAGNOSIS — I4891 Unspecified atrial fibrillation: Secondary | ICD-10-CM

## 2012-11-18 DIAGNOSIS — Z7901 Long term (current) use of anticoagulants: Secondary | ICD-10-CM

## 2012-11-18 LAB — CBC WITH DIFFERENTIAL/PLATELET
Basophils Relative: 0.5 % (ref 0.0–3.0)
Eosinophils Absolute: 0.1 10*3/uL (ref 0.0–0.7)
HCT: 37.5 % (ref 36.0–46.0)
Hemoglobin: 12.6 g/dL (ref 12.0–15.0)
Lymphocytes Relative: 12.2 % (ref 12.0–46.0)
Lymphs Abs: 1 10*3/uL (ref 0.7–4.0)
MCHC: 33.7 g/dL (ref 30.0–36.0)
Monocytes Relative: 9.7 % (ref 3.0–12.0)
Neutro Abs: 6.5 10*3/uL (ref 1.4–7.7)
RBC: 4.28 Mil/uL (ref 3.87–5.11)
RDW: 15.2 % — ABNORMAL HIGH (ref 11.5–14.6)

## 2012-11-18 LAB — BASIC METABOLIC PANEL
BUN: 18 mg/dL (ref 6–23)
CO2: 28 mEq/L (ref 19–32)
Calcium: 9.2 mg/dL (ref 8.4–10.5)
Chloride: 105 mEq/L (ref 96–112)
Creatinine, Ser: 1 mg/dL (ref 0.4–1.2)

## 2012-11-18 NOTE — Patient Instructions (Addendum)
Refer to [facility]   Dr Hurman Horn  [phone]      Your physician has recommended that you have a Cardioversion (DCCV). Electrical Cardioversion uses a jolt of electricity to your heart either through paddles or wired patches attached to your chest. This is a controlled, usually prescheduled, procedure. Defibrillation is done under light anesthesia in the hospital, and you usually go home the day of the procedure. This is done to get your heart back into a normal rhythm. You are not awake for the procedure. Please see the instruction sheet given to you today.  Your physician recommends that you return for lab work today  BMP/CBC/INR

## 2012-11-20 ENCOUNTER — Encounter (HOSPITAL_COMMUNITY): Payer: Self-pay | Admitting: Certified Registered"

## 2012-11-20 ENCOUNTER — Other Ambulatory Visit: Payer: Self-pay | Admitting: *Deleted

## 2012-11-20 ENCOUNTER — Encounter (HOSPITAL_COMMUNITY)
Admission: RE | Disposition: A | Discharge status: Home / Self Care | Payer: Self-pay | Source: Ambulatory Visit | Attending: Internal Medicine

## 2012-11-20 ENCOUNTER — Ambulatory Visit (HOSPITAL_COMMUNITY)
Admission: RE | Admit: 2012-11-20 | Discharge: 2012-11-20 | Disposition: A | Discharge status: Home / Self Care | Payer: Medicare Other | Source: Ambulatory Visit | Attending: Internal Medicine | Admitting: Internal Medicine

## 2012-11-20 ENCOUNTER — Ambulatory Visit (HOSPITAL_COMMUNITY): Payer: Medicare Other | Admitting: Certified Registered"

## 2012-11-20 DIAGNOSIS — E785 Hyperlipidemia, unspecified: Secondary | ICD-10-CM | POA: Diagnosis not present

## 2012-11-20 DIAGNOSIS — Z9884 Bariatric surgery status: Secondary | ICD-10-CM | POA: Diagnosis not present

## 2012-11-20 DIAGNOSIS — I4891 Unspecified atrial fibrillation: Secondary | ICD-10-CM

## 2012-11-20 HISTORY — PX: CARDIOVERSION: SHX1299

## 2012-11-20 SURGERY — CARDIOVERSION
Anesthesia: Monitor Anesthesia Care

## 2012-11-20 MED ORDER — SODIUM CHLORIDE 0.9 % IV SOLN
INTRAVENOUS | Status: DC | PRN
  Administered 2012-11-20: 13:00:00 via INTRAVENOUS

## 2012-11-20 MED ORDER — PROPOFOL 10 MG/ML IV BOLUS
INTRAVENOUS | Status: DC | PRN
  Administered 2012-11-20: 130 mg via INTRAVENOUS

## 2012-11-20 MED ORDER — DILTIAZEM HCL ER COATED BEADS 120 MG PO CP24: 120.0000 mg | ORAL_CAPSULE | Freq: Two times a day (BID) | ORAL | Status: DC

## 2012-11-20 MED ORDER — LIDOCAINE HCL (CARDIAC) 20 MG/ML IV SOLN
INTRAVENOUS | Status: DC | PRN
  Administered 2012-11-20: 40 mg via INTRAVENOUS

## 2012-11-20 NOTE — Op Note (Signed)
Patient anesthetized with 130 mg Propofol and 50 mg Lidocaine IV  With pads in AP position patient cardioverted to SR with 200J syncrhonized biphasic energy.

## 2012-11-20 NOTE — Preoperative (Signed)
Beta Blockers   Reason not to administer Beta Blockers:Not Applicable 

## 2012-11-20 NOTE — Anesthesia Postprocedure Evaluation (Signed)
  Anesthesia Post-op Note  Patient: Kayla Harrison  Procedure(s) Performed: Procedure(s): CARDIOVERSION (N/A)  Patient Location: PACU  Anesthesia Type:General  Level of Consciousness: awake, alert  and oriented  Airway and Oxygen Therapy: Patient Spontanous Breathing  Post-op Pain: none  Post-op Assessment: Post-op Vital signs reviewed  Post-op Vital Signs: Reviewed  Complications: No apparent anesthesia complications

## 2012-11-20 NOTE — Anesthesia Preprocedure Evaluation (Addendum)
Anesthesia Evaluation  Patient identified by MRN, date of birth, ID band Patient awake    Reviewed: Allergy & Precautions, H&P , NPO status , Patient's Chart, lab work & pertinent test results, reviewed documented beta blocker date and time   Airway Mallampati: II TM Distance: >3 FB Neck ROM: Full    Dental  (+) Teeth Intact and Dental Advisory Given   Pulmonary  breath sounds clear to auscultation        Cardiovascular hypertension, Pt. on medications + Peripheral Vascular Disease + dysrhythmias Atrial Fibrillation Rhythm:Irregular Rate:Abnormal     Neuro/Psych CVA    GI/Hepatic (+) Hepatitis -, Unspecified  Endo/Other  Hypothyroidism   Renal/GU      Musculoskeletal   Abdominal   Peds  Hematology   Anesthesia Other Findings   Reproductive/Obstetrics                         Anesthesia Physical Anesthesia Plan  ASA: III  Anesthesia Plan: General   Post-op Pain Management:    Induction: Intravenous  Airway Management Planned: Mask  Additional Equipment:   Intra-op Plan:   Post-operative Plan:   Informed Consent: I have reviewed the patients History and Physical, chart, labs and discussed the procedure including the risks, benefits and alternatives for the proposed anesthesia with the patient or authorized representative who has indicated his/her understanding and acceptance.     Plan Discussed with: Anesthesiologist, Surgeon and CRNA  Anesthesia Plan Comments:        Anesthesia Quick Evaluation

## 2012-11-20 NOTE — Interval H&P Note (Signed)
History and Physical Interval Note:  11/20/2012 1:04 PM  Kayla Harrison  has presented today for surgery, with the diagnosis of a fib  The various methods of treatment have been discussed with the patient and family. After consideration of risks, benefits and other options for treatment, the patient has consented to  Procedure(s): CARDIOVERSION (N/A) as a surgical intervention .  The patient's history has been reviewed, patient examined, no change in status, stable for surgery.  I have reviewed the patient's chart and labs.  Questions were answered to the patient's satisfaction.     Dietrich Pates

## 2012-11-20 NOTE — Transfer of Care (Signed)
Immediate Anesthesia Transfer of Care Note  Patient: Kayla Harrison [facility]  Procedure(s) Performed: Procedure(s): CARDIOVERSION (N/A)  Patient Location: Endoscopy Unit  Anesthesia Type:General  Level of Consciousness: awake, alert , oriented and patient cooperative  Airway & Oxygen Therapy: Patient Spontanous Breathing and Patient connected to nasal cannula oxygen  Post-op Assessment: Report given to PACU RN, Post -op Vital signs reviewed and stable and Patient moving all extremities  Post vital signs: Reviewed and stable  Complications: No apparent anesthesia complications

## 2012-11-20 NOTE — H&P (View-Only) (Signed)
Patient Care Team: Pecola Lawless, MD as PCP - General   HPI  Kayla Harrison is a 67 y.o. female Seen in followup for atrial fibrillation  for which she underwent pulmonary vein isolation at [facility] 2009 She has a history of prior strokes and remains on oral anticoagulation . Echo 2011 EF 55% with mod LAE-69mm  She also has a history of morbid obesity status post lap band surgery    She comes in now with complaints of recurrent and increasingly frequent episodes of atrial fibrillation.  They're associated with more lightheadedness than before. She has been on amiodarone for that and said she had recurrent symptoms at the time of her pneumonia in 2011. It is my recollection that she was on amiodarone prior to her PVI; she is not sure. We'll need to review the old record.   Past Medical History  Diagnosis Date  . Morbid obesity     Status post lap band surgery  . Hx of thyroid cancer     Dr Evlyn Kanner  . Colon polyp     Dr Kinnie Scales  . Dyslipidemia   . Hyperlipidemia   . Hypothyroidism   . Breast cancer     Dr Jamey Ripa, total thyroidectomy- 1999- for cancer  . Pneumonia     2010- hosp. here [facility]- during that event had to be cardioverted   . Arthritis     osteoarthritis - knees   . Normal cardiac stress test     2008- cardiac stress/echo  . Stroke     2003- [place]   . Atrial fibrillation     ablation- 2x's-- 1st time- [facility], 2nd event at [facility] in 2008. Dr. Graciela Husbands- [place]- sees pt. once /yr. ,  . Complication of anesthesia     Ketamine produces LSD reaction, bright colored nightmarish experience   . Hepatitis     [place] ?hepatitis     Past Surgical History  Procedure Date  . Thyroidectomy 1998    Dr Jamey Ripa  . Bso 1998  . Cholecystectomy   . Tonsillectomy   . Colonoscopy w/ polypectomy     Dr Kinnie Scales  . Afib ablation     X 2; [facility] & Dr Graciela Husbands  . Abdominal hysterectomy 1983  . Breast lumpectomy 2010  . Laparoscopic gastric  banding 07/10/2010  . Appendectomy   . Knee arthroscopy     bilateral  . Laparotomy     for ruptured ovary and ovarian artery   . Total knee arthroplasty 04/13/2012    Procedure: TOTAL KNEE ARTHROPLASTY;  Surgeon: Raymon Mutton, MD;  Location: [facility] OR;  Service: Orthopedics;  Laterality: Right;  . Joint replacement 04/13/12    Right Knee replacement  . Cardioversion 10/09/2012    Procedure: CARDIOVERSION;  Surgeon: Rollene Rotunda, MD;  Location: [facility] OR;  Service: Cardiovascular;  Laterality: N/A;  . Cardioversion 10/09/2012    Procedure: CARDIOVERSION;  Surgeon: Rollene Rotunda, MD;  Location: [facility] ENDOSCOPY;  Service: Cardiovascular;  Laterality: N/A;  Kathlene November gave the ok to add pt to the add on , but we must check to find out if the can add pt on at 1400 ( [phone])    Current Outpatient Prescriptions  Medication Sig Dispense Refill  . amiodarone (PACERONE) 200 MG tablet Take 200 mg by mouth See admin instructions. mon-fri only doesn't take on Saturday or sunday      . Calcium Citrate-Vitamin D (CALCIUM CITRATE + PO) Take 1 tablet by  mouth daily.       . Cholecalciferol (VITAMIN D3) 1000 UNITS CAPS Take 1 capsule by mouth daily.       . Diphenhydramine-APAP, sleep, (TYLENOL PM EXTRA STRENGTH PO) Take 1-2 tablets by mouth 2 (two) times daily as needed. For sleep or pain      . furosemide (LASIX) 40 MG tablet Take 40 mg by mouth daily as needed. For fluid      . levothyroxine (SYNTHROID, LEVOTHROID) 175 MCG tablet Take 175 mcg by mouth as directed. Take 6 days a week. Doesn't take on Saturday      . Multiple Vitamins-Minerals (MULTIVITAMINS THER. W/MINERALS) TABS Take 1 tablet by mouth daily.      . pravastatin (PRAVACHOL) 20 MG tablet LABS OVERDUE, 1 by mouth daily  30 tablet  0  . warfarin (COUMADIN) 5 MG tablet Take 2.5-5 mg by mouth daily. Take 1 tablet (5 mg) on Sunday,Tuesday,Thursday & Saturday. Take 0.5  (2.5) tablet on Monday, Wednesday and Friday.        Allergies  Allergen Reactions  .  Xarelto (Rivaroxaban)     Nose Bleed X 6 hrs ; packing in ER  . Epinephrine Other (See Comments)    Dental form only (liquid). Patient stated she will become "out of it" she can hear you but cannot respond in normal fashion.    Review of Systems negative except from HPI and PMH  Physical Exam BP 134/68  Pulse 69  Ht 5\' 6"  (1.676 m)  Wt 234 lb (106.142 kg)  BMI 37.77 kg/m2 Well developed and well nourished in no acute distress HENT normal E scleral and icterus clear Neck Supple JVP flat; carotids brisk and full Clear to ausculation  Regular rate and rhythm, no murmurs gallops or rub Soft with active bowel sounds No clubbing cyanosis none Edema Alert and oriented, grossly normal motor and sensory function Skin Warm and Dry  ECG demonstrates sinus rhythm at 63 Intervals 19/10/45 Axis is 26  Assessment and  Plan

## 2012-11-20 NOTE — Anesthesia Postprocedure Evaluation (Signed)
  Anesthesia Post-op Note  Patient: Kayla Harrison  Procedure(s) Performed: Procedure(s): CARDIOVERSION (N/A)  Patient Location: Endoscopy Unit  Anesthesia Type:General  Level of Consciousness: awake, alert , oriented and patient cooperative  Airway and Oxygen Therapy: Patient Spontanous Breathing and Patient connected to nasal cannula oxygen  Post-op Pain: none  Post-op Assessment: Post-op Vital signs reviewed, Patient's Cardiovascular Status Stable, Respiratory Function Stable, Patent Airway and No signs of Nausea or vomiting  Post-op Vital Signs: Reviewed and stable  Complications: No apparent anesthesia complications

## 2012-11-20 NOTE — Progress Notes (Signed)
IV inserted 11/08/12 not present on assessment

## 2012-11-22 NOTE — Assessment & Plan Note (Signed)
The patient has symptomatic recurrent persistent atrial fibrillation despite medical therapy with amiodarone.  She had prior afib ablation at [facility] 2009.  She has severe LA enlargement.  She does not tolerate her afib. Therapeutic strategies for afib including medicine and ablation were discussed in detail with the patient today. Risk, benefits, and alternatives to EP study and radiofrequency ablation for afib were also discussed in detail today.  She understands that anticipated success rates with traditional ablation are 50-60%.  We also discussed the convergent procedure at length today.  She understands that this procedure may carry better success rates for her long term.  Given her prior gastric bypass however, she may not be a candidate for this procedure. At this time, she would like to proceed to consultation for the convergent procedure at [facility].  IF she is felt to not be a candidate for this procedure or if she decides to proceed with traditional PVI then she will likely have me perform ablation here.  She will continue to followup with Dr Graciela Husbands in the interim. Continue current medical therapy.  While waiting for consultation at [facility], I think that we should proceed with cardioversion.  She has been adequately anticoagulated.  Risks, benefits, and alternatives to [place] were discussed at length with the patient who wishes to proceed.

## 2012-11-22 NOTE — Assessment & Plan Note (Signed)
Weight loss is advised 

## 2012-11-22 NOTE — Progress Notes (Signed)
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   She underwent atrial fibrillation ablation at [facility] in 2009.  She had early afib recurrence.  She has been managed since with amiodarone with fairly good success.  She has severe LA enlargement (LA size 51mm by 2D echo).  She has a history of prior strokes and remains on oral anticoagulation chronically.   She recently returned to see Dr Graciela Husbands and reports worsening afib.  She reports symptoms of palpitations, fatigue, and decreased exercise tolerance.  Her amiodarone was increased and she is referred to me for further evaluation.  Today, she denies symptoms of chest pain, shortness of breath, orthopnea, PND, lower extremity edema, dizziness, presyncope, syncope, or neurologic sequela. The patient is tolerating medications without difficulties and is otherwise without complaint today.   Past Medical History  Diagnosis Date  . Morbid obesity     Status post lap band surgery  . Hx of thyroid cancer     Dr Evlyn Kanner  . Colon polyp     Dr Kinnie Scales  . Dyslipidemia   . Hyperlipidemia   . Hypothyroidism   . Breast cancer     Dr Jamey Ripa, total thyroidectomy- 1999- for cancer  . Pneumonia     2010- hosp. here [facility]- during that event had to be cardioverted   . Arthritis     osteoarthritis - knees   . Normal cardiac stress test     2008- cardiac stress/echo  . Stroke     2003- [place]   . Atrial fibrillation     ablation- 2x's-- 1st time- [facility], 2nd event at [facility] in 2008. Dr. Graciela Husbands- [place]- sees pt. once /yr. ,  . Complication of anesthesia     Ketamine produces LSD reaction, bright colored nightmarish experience   . Hepatitis     [place] ?hepatitis    Past Surgical History  Procedure Laterality Date  . Thyroidectomy  1998    Dr Jamey Ripa  .  Bso  1998  . Cholecystectomy    . Tonsillectomy    . Colonoscopy w/ polypectomy      Dr Kinnie Scales  . Afib ablation      X 2; [facility] & Dr Graciela Husbands  . Abdominal hysterectomy  1983  . Breast lumpectomy  2010  . Laparoscopic gastric banding  07/10/2010  . Appendectomy    . Knee arthroscopy      bilateral  . Laparotomy      for ruptured ovary and ovarian artery   . Total knee arthroplasty  04/13/2012    Procedure: TOTAL KNEE ARTHROPLASTY;  Surgeon: Raymon Mutton, MD;  Location: [facility] OR;  Service: Orthopedics;  Laterality: Right;  . Joint replacement  04/13/12    Right Knee replacement  . Cardioversion  10/09/2012    Procedure: CARDIOVERSION;  Surgeon: Rollene Rotunda, MD;  Location: [facility] OR;  Service: Cardiovascular;  Laterality: N/A;  . Cardioversion  10/09/2012    Procedure: CARDIOVERSION;  Surgeon: Rollene Rotunda, MD;  Location: [facility] ENDOSCOPY;  Service: Cardiovascular;  Laterality: N/A;  Kathlene November gave the ok to add pt to the add on , but we must check to find out if the can add pt on at [phone] ( [phone])    Current Outpatient Prescriptions  Medication Sig Dispense Refill  . amiodarone (PACERONE) 200 MG tablet  Take 200 mg by mouth daily.      . Calcium Citrate-Vitamin D (CALCIUM CITRATE + PO) Take 1 tablet by mouth daily.       . Cholecalciferol (VITAMIN D3) 1000 UNITS CAPS Take 1 capsule by mouth daily.       . Diphenhydramine-APAP, sleep, (TYLENOL PM EXTRA STRENGTH PO) Take 1-2 tablets by mouth 2 (two) times daily as needed. For sleep or pain      . furosemide (LASIX) 40 MG tablet Take 40 mg by mouth daily as needed. For fluid      . levothyroxine (SYNTHROID, LEVOTHROID) 175 MCG tablet Take 175 mcg by mouth as directed. Take 6 days a week. Doesn't take on Saturday      . Multiple Vitamins-Minerals (MULTIVITAMINS THER. W/MINERALS) TABS Take 1 tablet by mouth daily.      . pravastatin (PRAVACHOL) 20 MG tablet Take 20 mg by mouth every evening.      . warfarin (COUMADIN) 5 MG tablet Take 2.5-5 mg by mouth  daily. Coumadin 1 tab (5mg ) on MWF and 0.5 tab (2.5mg ) other days of the week.      . diltiazem (CARDIZEM CD) 120 MG 24 hr capsule Take 1 capsule (120 mg total) by mouth 2 (two) times daily.  60 capsule  6   No current facility-administered medications for this visit.    Allergies  Allergen Reactions  . Xarelto (Rivaroxaban)     Nose Bleed X 6 hrs ; packing in ER  . Epinephrine Other (See Comments)    Dental form only (liquid). Patient stated she will become "out of it" she can hear you but cannot respond in normal fashion.    History   Social History  . Marital Status: Single    Spouse Name: N/A    Number of Children: 0  . Years of Education: N/A   Occupational History  . [employer]    Social History Main Topics  . Smoking status: Never Smoker   . Smokeless tobacco: Never Used  . Alcohol Use: No  . Drug Use: No  . Sexually Active: Not on file   Other Topics Concern  . Not on file   Social History Narrative   SINGLE    NO CHILDREN   NEVER SMOKED   EXERCISE 3 X WK          Family History  Problem Relation Age of Onset  . Cancer Father     COLON  . Heart disease Father 52    MI @autopsy   . Diabetes Sister   . Diabetes Brother   . Diabetes Paternal Aunt   . Diabetes Paternal Grandmother     ROS- All systems are reviewed and negative except as per the HPI above  Physical Exam: Filed Vitals:   11/18/12 0903  BP: 131/64  Pulse: 112  Height: 5\' 6"  (1.676 m)  Weight: 237 lb 6.4 oz (107.684 kg)    GEN- The patient is overweight appearing, alert and oriented x 3 today.   Head- normocephalic, atraumatic Eyes-  Sclera clear, conjunctiva pink Ears- hearing intact Oropharynx- clear Neck- supple Lungs- Clear to ausculation bilaterally, normal work of breathing Heart- irregular rate and rhythm, no murmurs, rubs or gallops, PMI not laterally displaced GI- soft, NT, ND, + BS Extremities- no clubbing, cyanosis, or edema MS- no  significant deformity or atrophy Skin- no rash or lesion Psych- euthymic mood, full affect Neuro- strength and sensation are intact  EKG today reveals afib, V  rate 112 bpm, nonspecific ST/T changes Echo reviewed  Assessment and Plan:

## 2012-11-22 NOTE — Assessment & Plan Note (Signed)
Given prior h/o stroke, she should continue life long anticoagulation

## 2012-11-23 ENCOUNTER — Encounter (HOSPITAL_COMMUNITY): Payer: Self-pay | Admitting: Internal Medicine

## 2012-11-26 ENCOUNTER — Other Ambulatory Visit: Payer: Self-pay | Admitting: Internal Medicine

## 2012-11-30 NOTE — Telephone Encounter (Signed)
LIPID/HEP 272.4/995.20  

## 2012-12-01 ENCOUNTER — Ambulatory Visit (HOSPITAL_COMMUNITY): Admit: 2012-12-01 | Payer: Self-pay | Admitting: Internal Medicine

## 2012-12-01 ENCOUNTER — Encounter (HOSPITAL_COMMUNITY): Payer: Self-pay

## 2012-12-01 SURGERY — ATRIAL FIBRILLATION ABLATION
Anesthesia: Monitor Anesthesia Care

## 2012-12-03 ENCOUNTER — Ambulatory Visit (INDEPENDENT_AMBULATORY_CARE_PROVIDER_SITE_OTHER): Payer: Medicare Other | Admitting: Physician Assistant

## 2012-12-03 ENCOUNTER — Encounter (INDEPENDENT_AMBULATORY_CARE_PROVIDER_SITE_OTHER): Payer: Self-pay

## 2012-12-03 VITALS — BP 136/70 | HR 66 | Resp 16 | Ht 65.5 in | Wt 237.0 lb

## 2012-12-03 DIAGNOSIS — Z4651 Encounter for fitting and adjustment of gastric lap band: Secondary | ICD-10-CM

## 2012-12-03 NOTE — Patient Instructions (Signed)
Take clear liquids tonight. Thin protein shakes are ok to start tomorrow morning. Slowly advance your diet thereafter. Call us if you have persistent vomiting or regurgitation, night cough or reflux symptoms. Return as scheduled or sooner if you notice no changes in hunger/portion sizes.  

## 2012-12-03 NOTE — Progress Notes (Signed)
  HISTORY: Kayla Harrison is a 67 y.o.female who received an AP-Standard lap-band in October 2011 by Dr. Daphine Deutscher. She has had recent difficulty with atrial fibrillation, involving two cardioversions. At this point she's begin referred to [facility] for a special procedure. With this she's been unable to exercise and this hasn't helped her weight loss. She denies regurgitation or reflux symptoms. She is hungry however and is eating more than she'd desire.  VITAL SIGNS: Filed Vitals:   12/03/12 0924  BP: 136/70  Pulse: 66  Resp: 16    PHYSICAL EXAM: Physical exam reveals a very well-appearing 68 y.o.female in no apparent distress Neurologic: Awake, alert, oriented Psych: Bright affect, conversant Respiratory: Breathing even and unlabored. No stridor or wheezing Abdomen: Soft, nontender, nondistended to palpation. Incisions well-healed. No incisional hernias. Port easily palpated. Extremities: Atraumatic, good range of motion.  ASSESMENT: 67 y.o.  female  s/p AP-Standard lap-band.   PLAN: The patient's port was accessed with a 20G Huber needle without difficulty. Clear fluid was aspirated and 0.25 mL saline was added to the port. The patient was able to swallow water without difficulty following the procedure and was instructed to take clear liquids for the next 24-48 hours and advance slowly as tolerated.

## 2012-12-10 ENCOUNTER — Ambulatory Visit: Payer: Medicare Other | Admitting: Internal Medicine

## 2012-12-11 DIAGNOSIS — I4891 Unspecified atrial fibrillation: Secondary | ICD-10-CM | POA: Diagnosis not present

## 2012-12-11 DIAGNOSIS — I499 Cardiac arrhythmia, unspecified: Secondary | ICD-10-CM | POA: Diagnosis not present

## 2012-12-14 ENCOUNTER — Telehealth: Payer: Self-pay | Admitting: Internal Medicine

## 2012-12-14 NOTE — Telephone Encounter (Signed)
New Prob   Pt has some questions regarding future appt.

## 2012-12-14 NOTE — Telephone Encounter (Signed)
Spoke with patient regarding question about upcoming appointment.  Patient was unsure of when she was scheduled to come in to see Dr. Johney Frame. Patient currently scheduled to come in 4/14 @ 1015. Patient asked me to make Dr. Johney Frame aware that she has seen Dr. Hurman Horn and "we are proceeding."

## 2012-12-16 ENCOUNTER — Ambulatory Visit (INDEPENDENT_AMBULATORY_CARE_PROVIDER_SITE_OTHER): Payer: Medicare Other | Admitting: *Deleted

## 2012-12-16 ENCOUNTER — Other Ambulatory Visit: Payer: Self-pay | Admitting: Internal Medicine

## 2012-12-16 DIAGNOSIS — I6350 Cerebral infarction due to unspecified occlusion or stenosis of unspecified cerebral artery: Secondary | ICD-10-CM | POA: Diagnosis not present

## 2012-12-16 DIAGNOSIS — Z7901 Long term (current) use of anticoagulants: Secondary | ICD-10-CM

## 2012-12-16 DIAGNOSIS — M76899 Other specified enthesopathies of unspecified lower limb, excluding foot: Secondary | ICD-10-CM | POA: Diagnosis not present

## 2012-12-16 DIAGNOSIS — I4891 Unspecified atrial fibrillation: Secondary | ICD-10-CM

## 2012-12-16 LAB — POCT INR: INR: 2.5

## 2012-12-16 NOTE — Telephone Encounter (Signed)
If she has plans to proceed with ablation at [facility] then I do not need to see her 4/14.  Please reschedule her to follow-up with Dr Graciela Husbands instead in 2-3 months. Thanks

## 2012-12-17 NOTE — Telephone Encounter (Signed)
Lipid/Hep 272.4/995.20  

## 2012-12-19 ENCOUNTER — Other Ambulatory Visit: Payer: Self-pay | Admitting: Internal Medicine

## 2012-12-22 ENCOUNTER — Other Ambulatory Visit: Payer: Self-pay | Admitting: Internal Medicine

## 2012-12-30 ENCOUNTER — Ambulatory Visit (INDEPENDENT_AMBULATORY_CARE_PROVIDER_SITE_OTHER): Payer: Medicare Other | Admitting: *Deleted

## 2012-12-30 DIAGNOSIS — Z7901 Long term (current) use of anticoagulants: Secondary | ICD-10-CM | POA: Diagnosis not present

## 2012-12-30 DIAGNOSIS — I4891 Unspecified atrial fibrillation: Secondary | ICD-10-CM

## 2012-12-30 DIAGNOSIS — I6350 Cerebral infarction due to unspecified occlusion or stenosis of unspecified cerebral artery: Secondary | ICD-10-CM | POA: Diagnosis not present

## 2013-01-04 ENCOUNTER — Ambulatory Visit: Payer: Medicare Other | Admitting: Internal Medicine

## 2013-01-06 DIAGNOSIS — E785 Hyperlipidemia, unspecified: Secondary | ICD-10-CM | POA: Diagnosis not present

## 2013-01-06 DIAGNOSIS — Z01818 Encounter for other preprocedural examination: Secondary | ICD-10-CM | POA: Diagnosis not present

## 2013-01-06 DIAGNOSIS — Z79899 Other long term (current) drug therapy: Secondary | ICD-10-CM | POA: Diagnosis not present

## 2013-01-06 DIAGNOSIS — I4891 Unspecified atrial fibrillation: Secondary | ICD-10-CM | POA: Diagnosis not present

## 2013-01-06 DIAGNOSIS — Z8249 Family history of ischemic heart disease and other diseases of the circulatory system: Secondary | ICD-10-CM | POA: Diagnosis not present

## 2013-01-10 ENCOUNTER — Encounter: Payer: Self-pay | Admitting: Internal Medicine

## 2013-01-11 ENCOUNTER — Telehealth: Payer: Self-pay | Admitting: *Deleted

## 2013-01-11 DIAGNOSIS — Z01810 Encounter for preprocedural cardiovascular examination: Secondary | ICD-10-CM

## 2013-01-11 DIAGNOSIS — I4891 Unspecified atrial fibrillation: Secondary | ICD-10-CM

## 2013-01-11 NOTE — Telephone Encounter (Signed)
Received from " Patient Advise Request"- Will forward phone note to MD  From: Peggye Pitt   Sent: 01/10/2013 1:16 PM   To: [facility] Triage   Subject: Non-Urgent Medical Question       Dr. Graciela Husbands,    You discouraged me from taking Celebrex for osteoarthritis pain following knee replacement, What can I take for pain relief when I am on Warfarin? Tylenol is not helpful.   This is my second request for an answer. Previously message sent from visit in [facility] on 4/9. [facility] you [facility]?   Thanks,   Kayla Harrison

## 2013-01-12 NOTE — Telephone Encounter (Signed)
F/u    Pt calling again for someone to return her call

## 2013-01-12 NOTE — Telephone Encounter (Signed)
Left message for pt to call, about the only thing to take is tylenol.

## 2013-01-18 ENCOUNTER — Telehealth: Payer: Self-pay | Admitting: *Deleted

## 2013-01-18 ENCOUNTER — Encounter: Payer: Self-pay | Admitting: *Deleted

## 2013-01-18 DIAGNOSIS — I4891 Unspecified atrial fibrillation: Secondary | ICD-10-CM

## 2013-01-18 NOTE — Telephone Encounter (Addendum)
Pt is scheduled to have an atrial fib ablation with them on 03-02-13. Prior to that procedure she will need carotid dopplers, PFT's and a JV cath. Dr Graciela Husbands aware. Spoke with pt, carotid and PFT's scheduled. JV cath will be done 01-25-13. The pt is on coumadin, she has an appt with [facility] 01-20-13 and they will get her started with lovenox due to her hx of CVA. Pt aware of all the above.

## 2013-01-20 ENCOUNTER — Ambulatory Visit (HOSPITAL_COMMUNITY)
Admission: RE | Admit: 2013-01-20 | Discharge: 2013-01-20 | Disposition: A | Discharge status: Home / Self Care | Payer: Medicare Other | Source: Ambulatory Visit | Attending: Internal Medicine | Admitting: Internal Medicine

## 2013-01-20 ENCOUNTER — Ambulatory Visit (INDEPENDENT_AMBULATORY_CARE_PROVIDER_SITE_OTHER): Payer: Medicare Other | Admitting: Pharmacist

## 2013-01-20 ENCOUNTER — Encounter (INDEPENDENT_AMBULATORY_CARE_PROVIDER_SITE_OTHER): Payer: Medicare Other

## 2013-01-20 ENCOUNTER — Other Ambulatory Visit (INDEPENDENT_AMBULATORY_CARE_PROVIDER_SITE_OTHER): Payer: Medicare Other

## 2013-01-20 DIAGNOSIS — Z7901 Long term (current) use of anticoagulants: Secondary | ICD-10-CM

## 2013-01-20 DIAGNOSIS — I5032 Chronic diastolic (congestive) heart failure: Secondary | ICD-10-CM

## 2013-01-20 DIAGNOSIS — I4891 Unspecified atrial fibrillation: Secondary | ICD-10-CM

## 2013-01-20 DIAGNOSIS — I6350 Cerebral infarction due to unspecified occlusion or stenosis of unspecified cerebral artery: Secondary | ICD-10-CM

## 2013-01-20 DIAGNOSIS — Z01810 Encounter for preprocedural cardiovascular examination: Secondary | ICD-10-CM

## 2013-01-20 DIAGNOSIS — I6529 Occlusion and stenosis of unspecified carotid artery: Secondary | ICD-10-CM

## 2013-01-20 DIAGNOSIS — E782 Mixed hyperlipidemia: Secondary | ICD-10-CM | POA: Diagnosis not present

## 2013-01-20 DIAGNOSIS — R42 Dizziness and giddiness: Secondary | ICD-10-CM

## 2013-01-20 DIAGNOSIS — Z01811 Encounter for preprocedural respiratory examination: Secondary | ICD-10-CM | POA: Insufficient documentation

## 2013-01-20 LAB — BASIC METABOLIC PANEL
BUN: 26 mg/dL — ABNORMAL HIGH (ref 6–23)
Calcium: 9.5 mg/dL (ref 8.4–10.5)
Creatinine, Ser: 1.3 mg/dL — ABNORMAL HIGH (ref 0.4–1.2)
GFR: 43.6 mL/min — ABNORMAL LOW (ref >60.00)
Glucose, Bld: 113 mg/dL — ABNORMAL HIGH (ref 70–99)
Sodium: 140 mEq/L (ref 135–145)

## 2013-01-20 LAB — CBC WITH DIFFERENTIAL/PLATELET
Basophils Absolute: 0.1 10*3/uL (ref 0.0–0.1)
Lymphocytes Relative: 22.8 % (ref 12.0–46.0)
Monocytes Relative: 11.5 % (ref 3.0–12.0)
Neutrophils Relative %: 62.9 % (ref 43.0–77.0)
Platelets: 231 10*3/uL (ref 150.0–400.0)
RDW: 15.1 % — ABNORMAL HIGH (ref 11.5–14.6)

## 2013-01-20 LAB — HEPATIC FUNCTION PANEL
AST: 26 U/L (ref 0–37)
Alkaline Phosphatase: 55 U/L (ref 39–117)
Total Bilirubin: 0.9 mg/dL (ref 0.3–1.2)

## 2013-01-20 LAB — PULMONARY FUNCTION TEST

## 2013-01-20 MED ORDER — ALBUTEROL SULFATE (5 MG/ML) 0.5% IN NEBU
2.5000 mg | INHALATION_SOLUTION | Freq: Once | RESPIRATORY_TRACT | Status: AC
  Administered 2013-01-20: 2.5 mg via RESPIRATORY_TRACT

## 2013-01-20 NOTE — Patient Instructions (Addendum)
4/30- No Coumadin or Lovenox 5/1- Start Lovenox 100mg  in PM 5/2- Lovenox 100mg  in AM and PM 5/3- Lovenox 100mg  in AM and PM 5/4- Lovenox 100mg  in AM only 5/5- Day of Procedure.  Restart Coumadin AND Lovenox when okay with MD.  Restart Coumadin with an extra 1/2 tablet x 2 days then resume previous dose.  Continue Lovenox 100mg  twice a day.  5/9- Recheck INR

## 2013-01-22 ENCOUNTER — Other Ambulatory Visit: Payer: Self-pay | Admitting: Internal Medicine

## 2013-01-22 NOTE — Progress Notes (Signed)
Received call from [facility] at [facility] office at [facility], they request the pt has a R & L cardiac cath prior to having an ablation with them in June. Pt also scheduled for PFT's and carotid dopplers at their request. Pt will having bridging with Lovenox. Pt aware of above and all procedures scheduled.

## 2013-01-25 ENCOUNTER — Encounter (HOSPITAL_BASED_OUTPATIENT_CLINIC_OR_DEPARTMENT_OTHER): Payer: Self-pay

## 2013-01-25 ENCOUNTER — Inpatient Hospital Stay (HOSPITAL_BASED_OUTPATIENT_CLINIC_OR_DEPARTMENT_OTHER)
Admission: RE | Admit: 2013-01-25 | Discharge: 2013-01-25 | Disposition: A | Discharge status: Home / Self Care | Payer: Medicare Other | Source: Ambulatory Visit | Attending: Cardiovascular Disease | Admitting: Cardiovascular Disease

## 2013-01-25 ENCOUNTER — Ambulatory Visit (HOSPITAL_COMMUNITY)
Admission: RE | Admit: 2013-01-25 | Discharge: 2013-01-25 | Disposition: A | Discharge status: Home / Self Care | Payer: Medicare Other | Source: Ambulatory Visit | Attending: Cardiovascular Disease | Admitting: Cardiovascular Disease

## 2013-01-25 ENCOUNTER — Encounter (HOSPITAL_BASED_OUTPATIENT_CLINIC_OR_DEPARTMENT_OTHER)
Admission: RE | Disposition: A | Discharge status: Home / Self Care | Payer: Self-pay | Source: Ambulatory Visit | Attending: Cardiovascular Disease

## 2013-01-25 ENCOUNTER — Ambulatory Visit (INDEPENDENT_AMBULATORY_CARE_PROVIDER_SITE_OTHER): Payer: Medicare Other | Admitting: *Deleted

## 2013-01-25 DIAGNOSIS — R9431 Abnormal electrocardiogram [ECG] [EKG]: Secondary | ICD-10-CM | POA: Insufficient documentation

## 2013-01-25 DIAGNOSIS — Z7901 Long term (current) use of anticoagulants: Secondary | ICD-10-CM

## 2013-01-25 DIAGNOSIS — I2510 Atherosclerotic heart disease of native coronary artery without angina pectoris: Secondary | ICD-10-CM | POA: Diagnosis not present

## 2013-01-25 DIAGNOSIS — I4891 Unspecified atrial fibrillation: Secondary | ICD-10-CM | POA: Diagnosis not present

## 2013-01-25 DIAGNOSIS — Z8673 Personal history of transient ischemic attack (TIA), and cerebral infarction without residual deficits: Secondary | ICD-10-CM | POA: Diagnosis not present

## 2013-01-25 DIAGNOSIS — I6350 Cerebral infarction due to unspecified occlusion or stenosis of unspecified cerebral artery: Secondary | ICD-10-CM | POA: Diagnosis not present

## 2013-01-25 LAB — POCT I-STAT 3, VENOUS BLOOD GAS (G3P V)
Bicarbonate: 25.8 mEq/L — ABNORMAL HIGH (ref 20.0–24.0)
pH, Ven: 7.39 — ABNORMAL HIGH (ref 7.250–7.300)
pO2, Ven: 35 mmHg (ref 30.0–45.0)

## 2013-01-25 LAB — POCT I-STAT 3, ART BLOOD GAS (G3+)
O2 Saturation: 95 %
TCO2: 26 mmol/L (ref 0–100)
pCO2 arterial: 37.4 mmHg (ref 35.0–45.0)
pH, Arterial: 7.437 (ref 7.350–7.450)
pO2, Arterial: 72 mmHg — ABNORMAL LOW (ref 80.0–100.0)

## 2013-01-25 SURGERY — JV LEFT HEART CATHETERIZATION WITH CORONARY ANGIOGRAM
Anesthesia: Moderate Sedation | Laterality: Left

## 2013-01-25 MED ORDER — ACETAMINOPHEN 325 MG PO TABS: 650.0000 mg | ORAL_TABLET | ORAL | Status: DC | PRN

## 2013-01-25 MED ORDER — SODIUM CHLORIDE 0.9 % IV SOLN: INTRAVENOUS | Status: DC

## 2013-01-25 MED ORDER — ONDANSETRON HCL 4 MG/2ML IJ SOLN: 4.0000 mg | Freq: Four times a day (QID) | INTRAMUSCULAR | Status: DC | PRN

## 2013-01-25 MED ORDER — SODIUM CHLORIDE 0.9 % IV SOLN
INTRAVENOUS | Status: DC
  Administered 2013-01-25: 10:00:00 via INTRAVENOUS

## 2013-01-25 NOTE — CV Procedure (Signed)
    Cardiac Cath Note  Kayla Harrison [id number] 01/06/1946  Procedure: Right and Left  Heart Cardiac Catheterization Note Indications: pre-RF Ablation , request by Dr. Tyson Babinski  Procedure Details Consent: Obtained Time Out: Verified patient identification, verified procedure, site/side was marked, verified correct patient position, special equipment/implants available, Radiology Safety Procedures followed,  medications/allergies/relevent history reviewed, required imaging and test results available.  Performed   Medications: Fentanyl: 50 mcg IV  Versed: 2 mg IV  The right femoral artery was easily canulated using a modified Seldinger technique.  Hemodynamics:   RA: 4/5/2 RV: 28/0/4 PCWP: 11/11/8 PA:  23/10/17  Cardiac Output   Thermodilution: 3.8   Index = 1.8  Fick : 5.2    Index - 2.4  Arterial Sat: 95% PA Sat: 66%.  LV pressure: 140/7 Aortic pressure: 137/72  Angiography   Left Main: smooth and normal   Left anterior Descending: minor luminal irreg proximally,  1st and 2nd diags are smooth and normal  Left Circumflex: large vessel.  1st OM  is very large .  Smooth and normal  Right Coronary Artery: large and dominant.  20-30% stnenosis in the mid vessel.  PDA and PLSA are normal  LV Gram: normal LV function.  EF 65-70%  Complications: No apparent complications Patient did tolerate procedure well.  Contrast used: 70 cc  Conclusions:  Minor luminal irregularities.  No flow limiting stenosis.  Normal LV function.   Normal right heart pressures.  Kayla Harrison, Kayla Harrison., MD, [facility] 01/25/2013, 11:17 AM Office - [phone] Pager [phone]

## 2013-01-25 NOTE — H&P (Signed)
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   She underwent atrial fibrillation ablation at [facility] in 2009.  She had early afib recurrence.  She has been managed since with amiodarone with fairly good success.  She has severe LA enlargement (LA size 51mm by 2D echo).  She has a history of prior strokes and remains on oral anticoagulation chronically.    She recently returned to see Dr Graciela Husbands and reports worsening afib.  She reports symptoms of palpitations, fatigue, and decreased exercise tolerance.  Her amiodarone was increased and she is referred to me for further evaluation.    she denies symptoms of chest pain, shortness of breath, orthopnea, PND, lower extremity edema, dizziness, presyncope, syncope, or neurologic sequela. The patient is tolerating medications without difficulties and is otherwise without complaint today.   She is scheduled for RF ablation at [facility] in June.  He has requested a R and L heart cath to be done prior to the RF ablation.    Past Medical History   Diagnosis  Date   .  Morbid obesity         Status post lap band surgery   .  Hx of thyroid cancer         Dr Evlyn Kanner   .  Colon polyp         Dr Kinnie Scales   .  Dyslipidemia     .  Hyperlipidemia     .  Hypothyroidism     .  Breast cancer         Dr Jamey Ripa, total thyroidectomy- 1999- for cancer   .  Pneumonia         2010- hosp. here [facility]- during that event had to be cardioverted    .  Arthritis         osteoarthritis - knees    .  Normal cardiac stress test         2008- cardiac stress/echo   .  Stroke         2003- [place]    .  Atrial fibrillation         ablation- 2x's-- 1st time- [facility], 2nd event at [facility] in 2008. Dr. Graciela Husbands- [place]- sees pt. once /yr. ,   .  Complication of anesthesia         Ketamine produces LSD reaction, bright colored  nightmarish experience    .  Hepatitis         [place] ?[place]patitis        Past Surgical History   Procedure  Laterality  Date   .  Thyroidectomy    1998       Dr Jamey Ripa   .  Bso    1998   .  Cholecystectomy       .  Tonsillectomy       .  Colonoscopy w/ polypectomy           Dr Kinnie Scales   .  Afib ablation           X 2; [facility] & Dr Graciela Husbands   .  Abdominal hysterectomy    1983   .  Breast lumpectomy    2010   .  Laparoscopic gastric banding    07/10/2010   .  Appendectomy       .  Knee arthroscopy           bilateral   .  Laparotomy           for ruptured ovary and ovarian artery    .  Total knee arthroplasty    04/13/2012       Procedure: TOTAL KNEE ARTHROPLASTY;  Surgeon: Raymon Mutton, MD;  Location: [facility] OR;  Service: Orthopedics;  Laterality: Right;   .  Joint replacement    04/13/12       Right Knee replacement   .  Cardioversion    10/09/2012       Procedure: CARDIOVERSION;  Surgeon: Rollene Rotunda, MD;  Location: [facility] OR;  Service: Cardiovascular;  Laterality: N/A;   .  Cardioversion    10/09/2012       Procedure: CARDIOVERSION;  Surgeon: Rollene Rotunda, MD;  Location: [facility] ENDOSCOPY;  Service: Cardiovascular;  Laterality: N/A;  Kathlene November gave the ok to add pt to the add on , but we must check to find out if the can add pt on at 1400 ( [phone])       Current Outpatient Prescriptions   Medication  Sig  Dispense  Refill   .  amiodarone (PACERONE) 200 MG tablet  Take 200 mg by mouth daily.         .  Calcium Citrate-Vitamin D (CALCIUM CITRATE + PO)  Take 1 tablet by mouth daily.          .  Cholecalciferol (VITAMIN D3) 1000 UNITS CAPS  Take 1 capsule by mouth daily.          .  Diphenhydramine-APAP, sleep, (TYLENOL PM EXTRA STRENGTH PO)  Take 1-2 tablets by mouth 2 (two) times daily as needed. For sleep or pain         .  furosemide (LASIX) 40 MG tablet  Take 40 mg by mouth daily as needed. For fluid         .  levothyroxine (SYNTHROID, LEVOTHROID) 175 MCG  tablet  Take 175 mcg by mouth as directed. Take 6 days a week. Doesn't take on Saturday         .  Multiple Vitamins-Minerals (MULTIVITAMINS THER. W/MINERALS) TABS  Take 1 tablet by mouth daily.         .  pravastatin (PRAVACHOL) 20 MG tablet  Take 20 mg by mouth every evening.         .  warfarin (COUMADIN) 5 MG tablet  Take 2.5-5 mg by mouth daily. Coumadin 1 tab (5mg ) on MWF and 0.5 tab (2.5mg ) other days of the week.         .  diltiazem (CARDIZEM CD) 120 MG 24 hr capsule  Take 1 capsule (120 mg total) by mouth 2 (two) times daily.   60 capsule   6       No current facility-administered medications for this visit.       Allergies   Allergen  Reactions   .  Xarelto (Rivaroxaban)         Nose Bleed X 6 hrs ; packing in ER   .  Epinephrine  Other (See Comments)       Dental form only (liquid). Patient stated she will become "out of it" she can hear you but cannot respond in normal fashion.       Social History   .  Marital Status:  Single       Spouse Name:  N/A       Number  of Children:  0      Occupational History   .  [employer]         Social History Main Topics   .  Smoking status:  Never Smoker    .  Smokeless tobacco:  Never Used   .  Alcohol Use:  No   .  Drug Use:  No   .  Sexually Active:  Not on file       Other Topics  Concern   .  Not on file       Social History Narrative     SINGLE      NO CHILDREN     NEVER SMOKED     EXERCISE 3 X WK                Family History   Problem  Relation  Age of Onset   .  Cancer  Father         COLON   .  Heart disease  Father  81       MI @autopsy    .  Diabetes  Sister     .  Diabetes  Brother     .  Diabetes  Paternal Aunt     .  Diabetes  Paternal Grandmother          ROS- All systems are reviewed and negative except as per the HPI above   Physical Exam:    BP:  131/64   Pulse:  112   Height:  5\' 6"  (1.676 m)   Weight:  237 lb 6.4 oz (107.684 kg)      GEN- The  patient is overweight appearing, alert and oriented x 3 today.    Head- normocephalic, atraumatic Eyes-  Sclera clear, conjunctiva pink Ears- hearing intact Oropharynx- clear Neck- supple Lungs- Clear to ausculation bilaterally, normal work of breathing Heart- irregular rate and rhythm, no murmurs, rubs or gallops, PMI not laterally displaced GI- soft, NT, ND, + BS Extremities- no clubbing, cyanosis, or edema MS- no significant deformity or atrophy Skin- no rash or lesion Psych- euthymic mood, full affect Neuro- strength and sensation are intact   EKG today reveals afib, V rate 112 bpm, nonspecific ST/T changes Echo reviewed   Assessment and Plan:       Atrial fibrillation :  The patient has seen Dr. Tyson Babinski at [facility].  He has requested a Right and Left heart cath prior to ablation.     I have discussed risks, benefits, options.  She understands and agrees to proceed.         CVA:      Given prior h/o stroke, she should continue life long anticoagulation .  She has been off coumadin for 5 days and has been getting Lovenox bridging.      Morbid obesity         Weight loss is advised      Vesta Mixer, [name], MD, [facility] 01/25/2013, 8:52 AM Office - [phone] Pager [phone]

## 2013-01-29 ENCOUNTER — Ambulatory Visit (INDEPENDENT_AMBULATORY_CARE_PROVIDER_SITE_OTHER): Payer: Medicare Other

## 2013-01-29 DIAGNOSIS — I6350 Cerebral infarction due to unspecified occlusion or stenosis of unspecified cerebral artery: Secondary | ICD-10-CM

## 2013-01-29 DIAGNOSIS — Z7901 Long term (current) use of anticoagulants: Secondary | ICD-10-CM

## 2013-01-29 DIAGNOSIS — I4891 Unspecified atrial fibrillation: Secondary | ICD-10-CM | POA: Diagnosis not present

## 2013-01-31 ENCOUNTER — Encounter: Payer: Self-pay | Admitting: Internal Medicine

## 2013-02-01 ENCOUNTER — Other Ambulatory Visit: Payer: Self-pay | Admitting: General Practice

## 2013-02-01 NOTE — Telephone Encounter (Signed)
Pt due for Fasting Lipids. Med filled for 30 only. Pt notified to call office to schedule.

## 2013-02-04 ENCOUNTER — Ambulatory Visit (INDEPENDENT_AMBULATORY_CARE_PROVIDER_SITE_OTHER): Payer: Medicare Other | Admitting: Internal Medicine

## 2013-02-04 VITALS — BP 126/82 | HR 81 | Temp 98.3°F | Wt 234.0 lb

## 2013-02-04 DIAGNOSIS — E782 Mixed hyperlipidemia: Secondary | ICD-10-CM | POA: Diagnosis not present

## 2013-02-04 DIAGNOSIS — I82619 Acute embolism and thrombosis of superficial veins of unspecified upper extremity: Secondary | ICD-10-CM

## 2013-02-04 DIAGNOSIS — R7309 Other abnormal glucose: Secondary | ICD-10-CM | POA: Diagnosis not present

## 2013-02-04 DIAGNOSIS — I4891 Unspecified atrial fibrillation: Secondary | ICD-10-CM | POA: Diagnosis not present

## 2013-02-04 DIAGNOSIS — Z7901 Long term (current) use of anticoagulants: Secondary | ICD-10-CM

## 2013-02-04 DIAGNOSIS — I82611 Acute embolism and thrombosis of superficial veins of right upper extremity: Secondary | ICD-10-CM

## 2013-02-04 LAB — POCT INR: INR: 2.6

## 2013-02-04 LAB — HEMOGLOBIN A1C: Hgb A1c MFr Bld: 5.9 % (ref 4.6–6.5)

## 2013-02-04 MED ORDER — PRAVASTATIN SODIUM 20 MG PO TABS: 20.0000 mg | ORAL_TABLET | Freq: Every evening | ORAL | Status: DC

## 2013-02-04 NOTE — Patient Instructions (Addendum)
Review and correct the record as indicated. Please share record with all medical staff seen. Use warm moist compresses to 3 times a day to the affected area as discussed

## 2013-02-04 NOTE — Assessment & Plan Note (Signed)
Her catheterization was normal; advanced cholesterol testing will be performed to optimally assess her LDL goal and present risk.

## 2013-02-04 NOTE — Progress Notes (Signed)
  Subjective:    Patient ID: Kayla Harrison, female    DOB: November 30, 1945, 67 y.o.   MRN: [id number]  HPI She is here to followup on her lipids. The last lipid profile was 12/20/10. LDL was 118; HDL 56.8 and triglycerides 75. Her last hepatic profile was 01/20/13 revealed normal function. Recent nonfasting glucose was 113; creatinine 1.3. GFR was 43.6.  She is on a heart healthy diet; she exercises as water aerobics 45 minutes 3 times per week without symptoms. Specifically she denies chest pain, palpitations, dyspnea, or claudication. Family history is positive for premature coronary disease in her PGrandfather . Advanced cholesterol testing results entered in Problem List.There is medication compliance with the statin. Significant abdominal symptoms, memory deficit, or myalgias denied. .    Review of Systems  She developed ecchymosis of the right forearm 01/29/13 in the context of combined warfarin and heparin therapy. She was on the dual therapy for 5 days has a bridge during cardiac catheterization. At the time the ecchymosis appeared without specific trigger; her INR was 1.7. The heparin was discontinued 5/9.  Cardiac cath was negative. A definitive ablation procedure is planned 03/02/13 at [facility]  Her INR today is 2.6, therapeutic.  The last TSH on record is 2010 but she is seen by the endocrinologist every 6 months     Objective:   Physical Exam Appears  well-nourished & in no acute distress  ? Faint L carotid bruit vs transmitted murmur present.No neck pain distention present at 10 - 15 degrees. Thyroid normal to palpation  Heart rhythm and rate are irregular with low grade flow murmur.  Chest is clear with no increased work of breathing  There is no evidence of aortic aneurysm or renal artery bruits  Abdomen soft with no organomegaly or masses. No HJR  No clubbing, cyanosis .Trace - 1/2 + edema present.  Pedal pulses are intact   No ischemic skin changes are present .  Thrombus superficially in an area of resolving ecchymosis right ventral forearm  Alert and oriented. Strength, tone normal          Assessment & Plan:

## 2013-02-05 ENCOUNTER — Ambulatory Visit (INDEPENDENT_AMBULATORY_CARE_PROVIDER_SITE_OTHER): Payer: Medicare Other | Admitting: Internal Medicine

## 2013-02-05 ENCOUNTER — Ambulatory Visit (INDEPENDENT_AMBULATORY_CARE_PROVIDER_SITE_OTHER): Payer: Medicare Other | Admitting: *Deleted

## 2013-02-05 VITALS — BP 133/70 | HR 72 | Ht 66.0 in | Wt 232.0 lb

## 2013-02-05 DIAGNOSIS — I4891 Unspecified atrial fibrillation: Secondary | ICD-10-CM | POA: Diagnosis not present

## 2013-02-05 DIAGNOSIS — I5032 Chronic diastolic (congestive) heart failure: Secondary | ICD-10-CM

## 2013-02-05 DIAGNOSIS — I6350 Cerebral infarction due to unspecified occlusion or stenosis of unspecified cerebral artery: Secondary | ICD-10-CM

## 2013-02-05 DIAGNOSIS — Z7901 Long term (current) use of anticoagulants: Secondary | ICD-10-CM

## 2013-02-05 DIAGNOSIS — E782 Mixed hyperlipidemia: Secondary | ICD-10-CM

## 2013-02-05 NOTE — Assessment & Plan Note (Addendum)
Anticipated to undergo convergent ablation next month.  Continue dig and dilt

## 2013-02-05 NOTE — Assessment & Plan Note (Signed)
We discussed statin therapy. Respirations would be that she be on higher dose statins. I will defer this to Dr. Alwyn Ren.

## 2013-02-05 NOTE — Patient Instructions (Addendum)
Your physician recommends that you schedule a follow-up appointment in: October with Dr. Graciela Husbands  Your physician recommends that you continue on your current medications as directed. Please refer to the Current Medication list given to you today.

## 2013-02-05 NOTE — Assessment & Plan Note (Signed)
euvolienmic

## 2013-02-05 NOTE — Progress Notes (Signed)
Patient Care Team: Pecola Lawless, MD as PCP - General   HPI  Kayla Harrison is a 67 y.o. female  Seen in followup for atrial fibrillation  for which she underwent pulmonary vein isolation at [facility] 2009 She has a history of prior strokes and remains on oral anticoagulation . Echo 2011 EF 55% with mod LAE-30mm  She also has a history of morbid obesity status post lap band surgery    She's had problems with recurrent atrial fibrillation. She is on amiodarone for a long time and she is currently undergoing evaluation at [facility] for consideration of convergent ablation. Catheterization last week in anticipation of that procedure demonstrated no obstructive coronary disease and normal right-sided pressures; LV function was normal  Past Medical History  Diagnosis Date  . Morbid obesity     Status post lap band surgery  . Hx of thyroid cancer     Dr Evlyn Kanner  . Colon polyp     Dr Kinnie Scales  . Dyslipidemia   . Hyperlipidemia   . Hypothyroidism   . Breast cancer     Dr Jamey Ripa, total thyroidectomy- 1999- for cancer  . Pneumonia     2010- hosp. here [facility]- during that event had to be cardioverted   . Arthritis     osteoarthritis - knees   . Normal cardiac stress test     2008- cardiac stress/echo  . Stroke     2003- [place]   . Atrial fibrillation     ablation- 2x's-- 1st time- [facility], 2nd event at [facility] in 2008. Dr. Graciela Husbands- [place]- sees pt. once /yr. ,  . Complication of anesthesia     Ketamine produces LSD reaction, bright colored nightmarish experience   . Hepatitis     [place] ?[place]     Past Surgical History  Procedure Laterality Date  . Thyroidectomy  1998    Dr Jamey Ripa  . Bso  1998  . Cholecystectomy    . Tonsillectomy    . Colonoscopy w/ polypectomy      Dr Kinnie Scales  . Afib ablation      X 2; [facility] & Dr Graciela Husbands  . Abdominal hysterectomy  1983  . Breast lumpectomy  2010  . Laparoscopic gastric banding  07/10/2010  . Appendectomy     . Knee arthroscopy      bilateral  . Laparotomy      for ruptured ovary and ovarian artery   . Total knee arthroplasty  04/13/2012    Procedure: TOTAL KNEE ARTHROPLASTY;  Surgeon: Raymon Mutton, MD;  Location: [facility] OR;  Service: Orthopedics;  Laterality: Right;  . Joint replacement  04/13/12    Right Knee replacement  . Cardioversion  10/09/2012    Procedure: CARDIOVERSION;  Surgeon: Rollene Rotunda, MD;  Location: [facility] OR;  Service: Cardiovascular;  Laterality: N/A;  . Cardioversion  10/09/2012    Procedure: CARDIOVERSION;  Surgeon: Rollene Rotunda, MD;  Location: [facility] ENDOSCOPY;  Service: Cardiovascular;  Laterality: N/A;  Kathlene November gave the ok to add pt to the add on , but we must check to find out if the can add pt on at 1400 ( [phone])  . Cardioversion N/A 11/20/2012    Procedure: CARDIOVERSION;  Surgeon: Pricilla Riffle, MD;  Location: [facility] ENDOSCOPY;  Service: Cardiovascular;  Laterality: N/A;    Current Outpatient Prescriptions  Medication Sig Dispense Refill  . Calcium Citrate-Vitamin D (CALCIUM CITRATE + PO) Take 1 tablet by mouth daily.       Marland Kitchen  celecoxib (CELEBREX) 100 MG capsule Take 100 mg by mouth daily. Dr.Lucey      . Cholecalciferol (VITAMIN D3) 1000 UNITS CAPS Take 1 capsule by mouth daily.       . digoxin (LANOXIN) 0.125 MG tablet Take 0.125 mg by mouth daily.      Marland Kitchen diltiazem (CARDIZEM CD) 120 MG 24 hr capsule Take 1 capsule (120 mg total) by mouth 2 (two) times daily.  60 capsule  6  . Diphenhydramine-APAP, sleep, (TYLENOL PM EXTRA STRENGTH PO) Take 1-2 tablets by mouth 2 (two) times daily as needed. For sleep or pain      . furosemide (LASIX) 40 MG tablet TAKE 1 TABLET BY MOUTH EVERY DAY AS DIRECTED  30 tablet  2  . levothyroxine (SYNTHROID, LEVOTHROID) 175 MCG tablet Take 175 mcg by mouth as directed. Take 6 days a week. Doesn't take on Saturday      . Multiple Vitamins-Minerals (MULTIVITAMINS THER. W/MINERALS) TABS Take 1 tablet by mouth daily.      . Omega-3 Fatty Acids (FISH OIL)  1000 MG CAPS Take by mouth daily.      . pravastatin (PRAVACHOL) 20 MG tablet LABS DUE, 1 by mouth daily  30 tablet  0  . warfarin (COUMADIN) 5 MG tablet Take 2.5-5 mg by mouth daily. Coumadin 1 tab (5mg ) on MWF and 0.5 tab (2.5mg ) other days of the week.       No current facility-administered medications for this visit.    Allergies  Allergen Reactions  . Xarelto (Rivaroxaban)     Nose Bleed X 6 hrs ; packing in ER  . Epinephrine Other (See Comments)    Dental form only (liquid). Patient stated she will become "out of it" she can hear you but cannot respond in normal fashion.    Review of Systems negative except from HPI and PMH  Physical Exam BP 133/70  Pulse 72  Ht 5\' 6"  (1.676 m)  Wt 232 lb (105.235 kg)  BMI 37.46 kg/m2  SpO2 98% Well developed and nourished in no acute distress HENT normal Neck supple with JVP-flat Clear Regular rate and rhythm, no murmurs or gallops Abd-soft with active BS No Clubbing cyanosis edema Skin-warm and dry A & Oriented  Grossly normal sensory and motor function     Assessment and  Plan

## 2013-02-08 ENCOUNTER — Encounter: Payer: Self-pay | Admitting: Internal Medicine

## 2013-02-18 DIAGNOSIS — N200 Calculus of kidney: Secondary | ICD-10-CM | POA: Diagnosis not present

## 2013-02-18 DIAGNOSIS — R310 Gross hematuria: Secondary | ICD-10-CM | POA: Diagnosis not present

## 2013-02-22 IMAGING — MG MM DIGITAL DIAGNOSTIC BILAT CAD
2 series · 2 of 2 positions shown · non-contrast
Comparison: 07/18/2010 and 07/07/2009 as well as 05/12/2008

CLINICAL DATA: Patient presents for bilateral diagnostic mammogram
due to a history of a prior left malignant lumpectomy 1101.  The
patient also states a palpable area of tenderness over the inner
midportion of the left breast for the past 5-6 months.

DIGITAL DIAGNOSTIC BILATERAL MAMMOGRAM WITH CAD AND LEFT BREAST
ULTRASOUND:

[L CC]
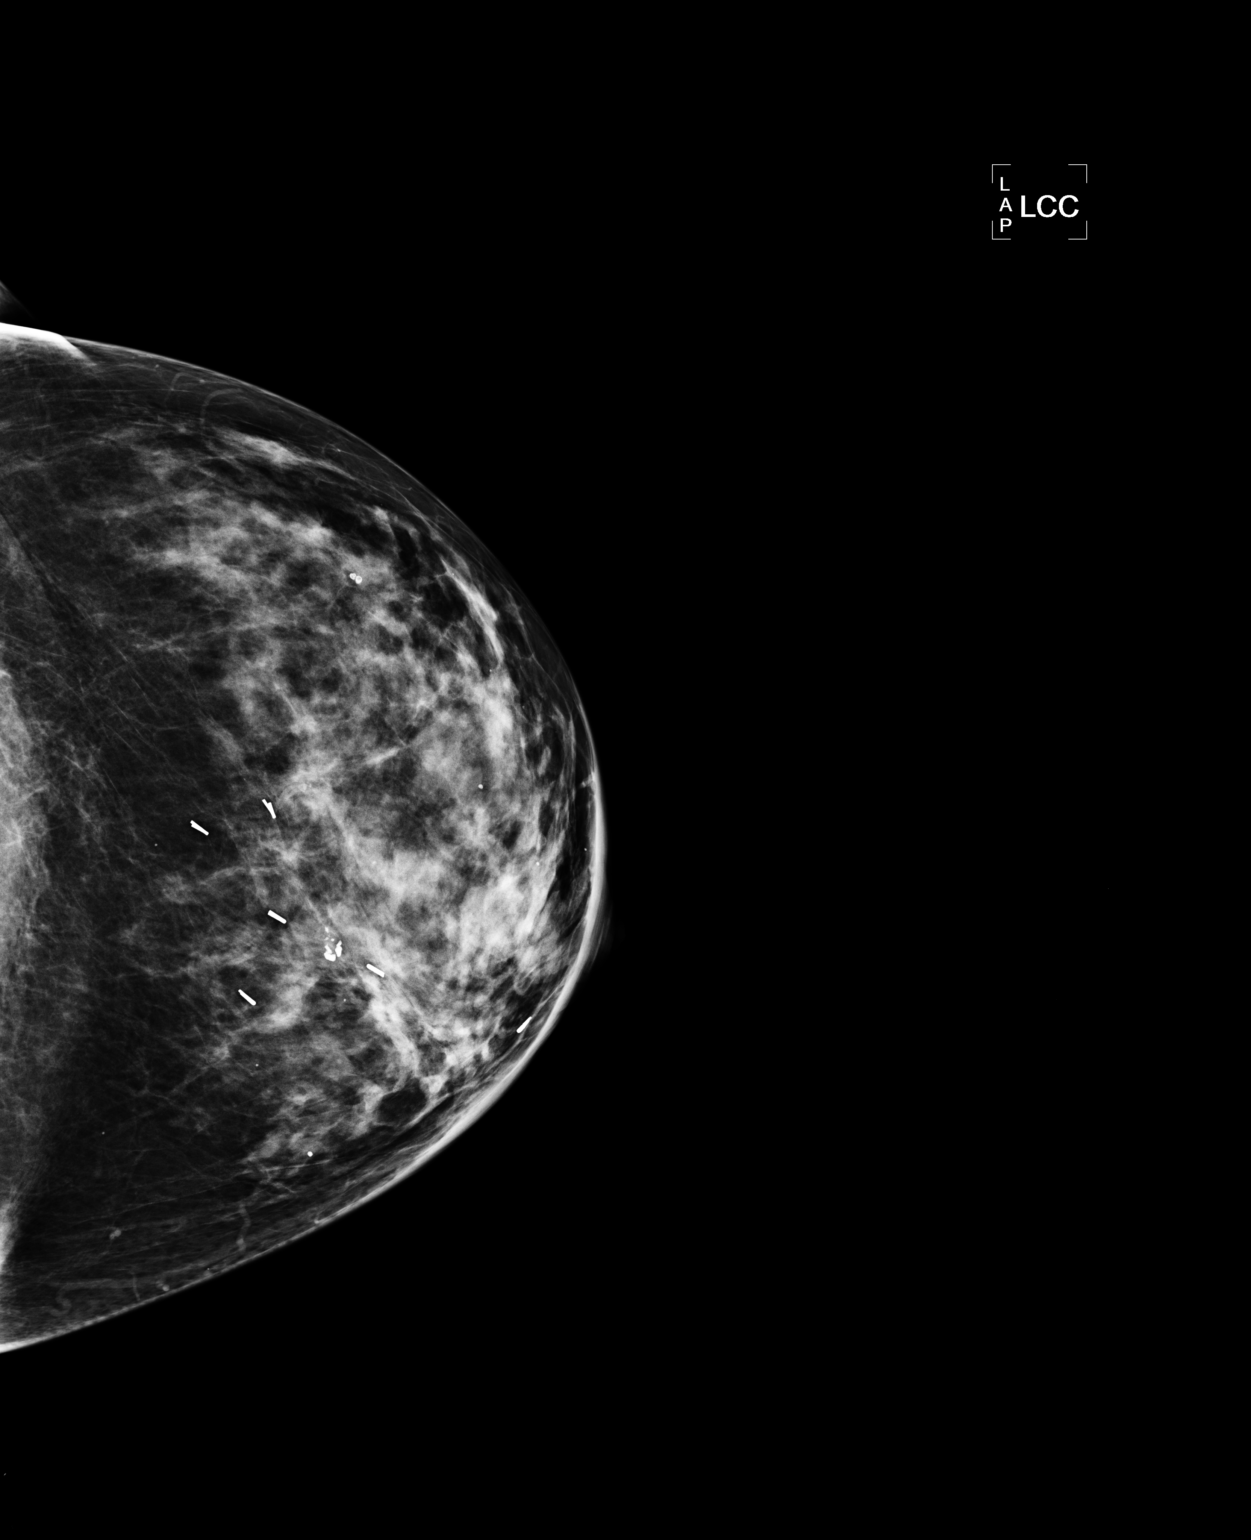

[L MLO]
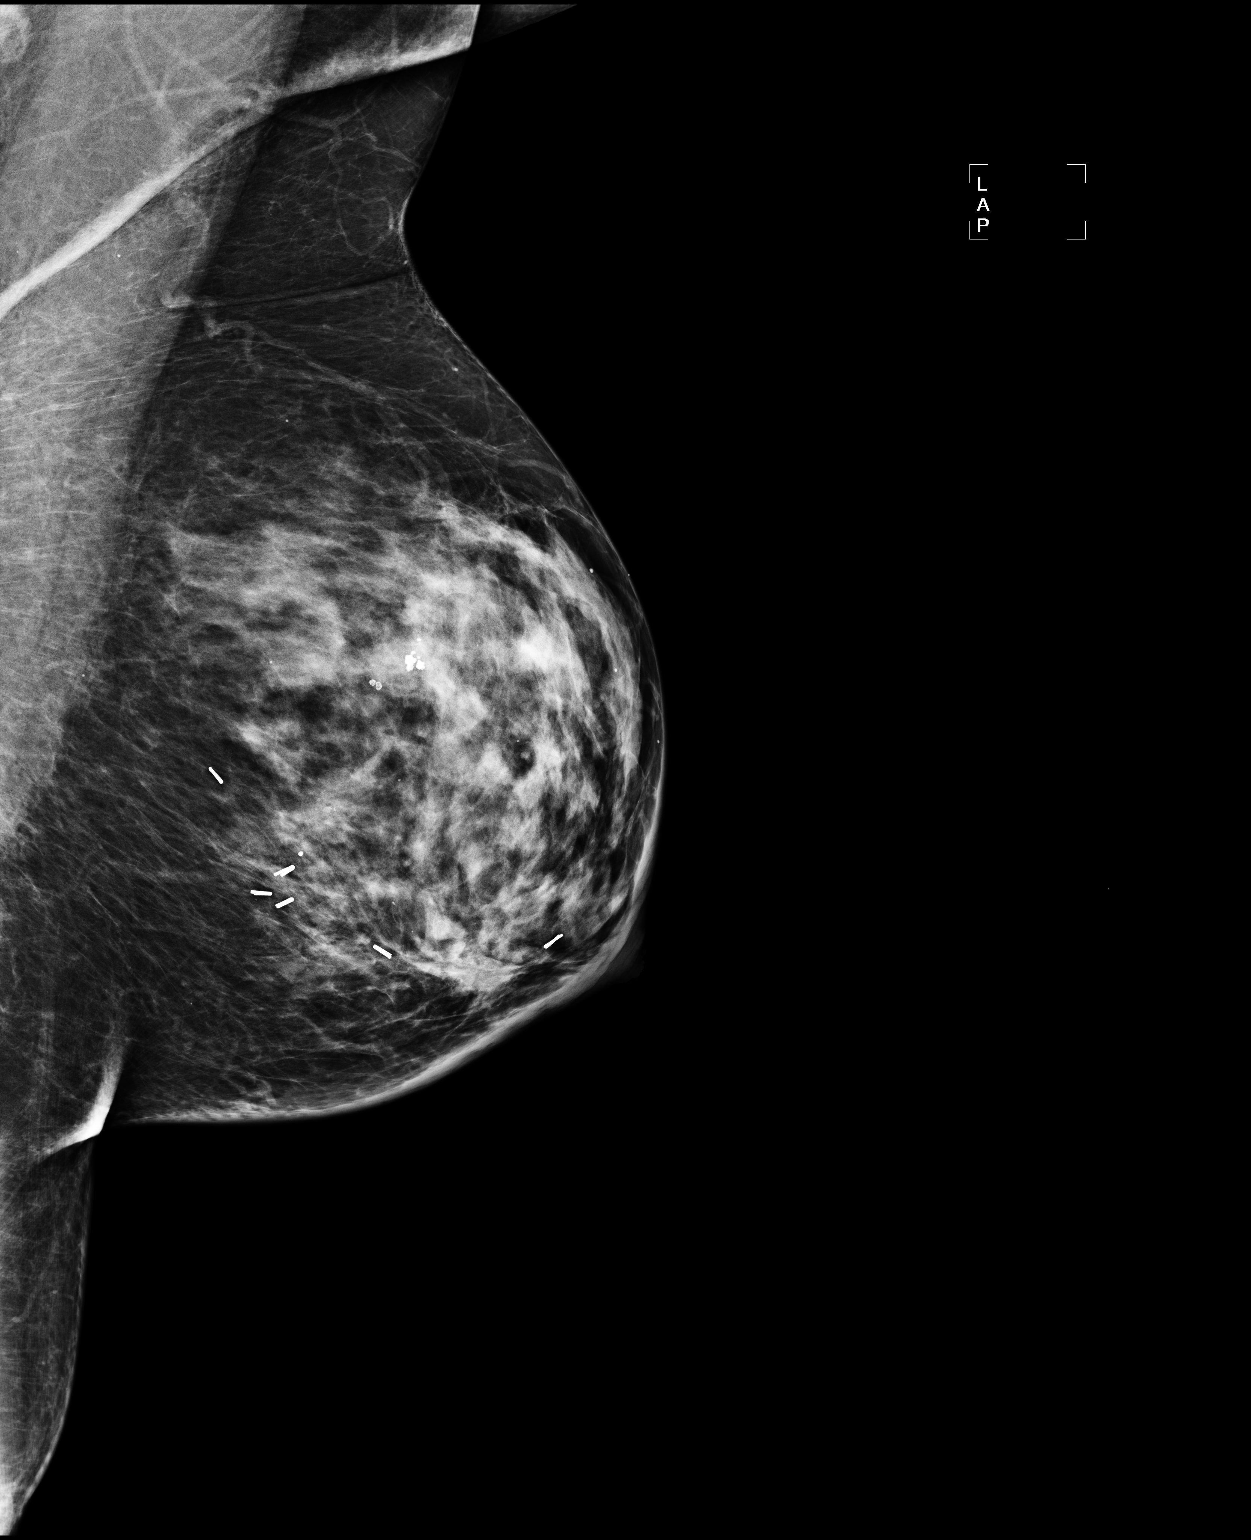

[2 of 2 positions shown; findings below may reference images not displayed]

FINDINGS: Examination demonstrates heterogeneously dense
fibroglandular tissue.  There are stable post lumpectomy changes of
the inner lower quadrant of the left breast.  There is no focal
abnormality over the area of patient's palpable tenderness in the
inner midportion of the left breast at approximately the 9 o'clock
position.
Mammographic images were processed with CAD.

On physical exam, there is a slightly firm ridge of tender tissue
at the 9 o'clock position of the left breast approximately 8 cm
from the nipple.

Ultrasound is performed, showing no focal abnormality over the
region of palpable tenderness at the 9 o'clock position of the left
breast.
IMPRESSION: Stable post lumpectomy changes of the inner lower quadrant of the
left breast.  No focal abnormality over the area of patient's
palpable tenderness at the 9 o'clock position of the left breast.

Recommendations:  Recommend continued annual bilateral diagnostic
mammographic evaluation.  Also recommend continued management of
patient's palpable tenderness on a clinical basis.

BI-RADS CATEGORY 2:  Benign finding(s).

## 2013-02-24 ENCOUNTER — Ambulatory Visit (INDEPENDENT_AMBULATORY_CARE_PROVIDER_SITE_OTHER): Payer: Medicare Other | Admitting: Pharmacist

## 2013-02-24 DIAGNOSIS — I6350 Cerebral infarction due to unspecified occlusion or stenosis of unspecified cerebral artery: Secondary | ICD-10-CM

## 2013-02-24 DIAGNOSIS — Z7901 Long term (current) use of anticoagulants: Secondary | ICD-10-CM | POA: Diagnosis not present

## 2013-02-24 DIAGNOSIS — I4891 Unspecified atrial fibrillation: Secondary | ICD-10-CM

## 2013-02-24 LAB — POCT INR: INR: 2.6

## 2013-02-24 MED ORDER — ENOXAPARIN SODIUM 100 MG/ML ~~LOC~~ SOLN: 100.0000 mg | Freq: Two times a day (BID) | SUBCUTANEOUS | Status: DC

## 2013-02-24 NOTE — Patient Instructions (Addendum)
6/4- Take last dose of Coumadin 6/5- No Coumadin or Lovenox 6/6- Lovenox 100mg  in AM and  PM 6/7- Lovenox 100mg  in AM and PM 6/8- Lovenox 100mg  in AM and PM 6/9- Lovenox 100mg  in AM only 6/10- Day of procedure.  Please call us when you are discharged to schedule a follow up appt.

## 2013-02-25 ENCOUNTER — Encounter (INDEPENDENT_AMBULATORY_CARE_PROVIDER_SITE_OTHER): Payer: Self-pay

## 2013-02-25 ENCOUNTER — Ambulatory Visit
Admission: RE | Admit: 2013-02-25 | Discharge: 2013-02-25 | Disposition: A | Discharge status: Home / Self Care | Payer: Medicare Other | Source: Ambulatory Visit | Attending: Physician Assistant | Admitting: Physician Assistant

## 2013-02-25 ENCOUNTER — Encounter: Payer: Self-pay | Admitting: Internal Medicine

## 2013-02-25 ENCOUNTER — Ambulatory Visit (INDEPENDENT_AMBULATORY_CARE_PROVIDER_SITE_OTHER): Payer: Medicare Other | Admitting: Physician Assistant

## 2013-02-25 VITALS — BP 126/70 | HR 69 | Temp 98.0°F | Resp 18 | Ht 65.5 in | Wt 236.0 lb

## 2013-02-25 DIAGNOSIS — Z4651 Encounter for fitting and adjustment of gastric lap band: Secondary | ICD-10-CM

## 2013-02-25 NOTE — Patient Instructions (Signed)
Obtain your x-ray today. We will call you if there are any concerning findings. Concentrate on healthy foods like lean protein and vegetables. Please call us for an appointment once you return from your trip abroad.

## 2013-02-25 NOTE — Progress Notes (Signed)
  HISTORY: Kayla Harrison is a 67 y.o.female who received an AP-Standard lap-band in October 2011 by Dr. Daphine Deutscher. She comes in today with a two-week complaint of of near obstruction. She says that two weeks ago she was traveling to [place] and ate a pork chop which normally she does not eat. She said it became stuck and she vomited for four hours. Since then, she's had excessive burping and about two hours after eating, she feels incredibly full. Also, she is undergoing a convergent procedure at [facility] this coming Tuesday which involves several hours of general anesthesia. Following this she's going to [place] in early July. She describes no difficulty taking liquids, but she does report drinking during meals as well.  VITAL SIGNS: Filed Vitals:   02/25/13 1443  BP: 126/70  Pulse: 69  Temp: 98 F (36.7 C)  Resp: 18    PHYSICAL EXAM: Physical exam reveals a very well-appearing 67 y.o.female in no apparent distress Neurologic: Awake, alert, oriented Psych: Bright affect, conversant Respiratory: Breathing even and unlabored. No stridor or wheezing Abdomen: Soft, nontender, nondistended to palpation. Incisions well-healed. No incisional hernias. Port easily palpated. Extremities: Atraumatic, good range of motion.  ASSESMENT: 67 y.o.  female  s/p AP-Standard lap-band.   PLAN: The patient's port was accessed with a 20G Huber needle without difficulty. Clear fluid was aspirated and 4.5 mL saline was removed from the port to give a total predicted volume of 0 mL. The patient was advised to concentrate on healthy food choices and to avoid slider foods high in fats and carbohydrates. I've ordered a KUB to investigate band position. If this is normal, we'll have her return to the office following her trip and procedure to get started again. She voiced understanding and agreement.

## 2013-02-28 ENCOUNTER — Encounter: Payer: Self-pay | Admitting: Internal Medicine

## 2013-03-01 DIAGNOSIS — M47814 Spondylosis without myelopathy or radiculopathy, thoracic region: Secondary | ICD-10-CM | POA: Diagnosis not present

## 2013-03-01 DIAGNOSIS — E039 Hypothyroidism, unspecified: Secondary | ICD-10-CM | POA: Diagnosis not present

## 2013-03-01 DIAGNOSIS — I2789 Other specified pulmonary heart diseases: Secondary | ICD-10-CM | POA: Diagnosis not present

## 2013-03-01 DIAGNOSIS — Z01818 Encounter for other preprocedural examination: Secondary | ICD-10-CM | POA: Diagnosis not present

## 2013-03-01 DIAGNOSIS — I4891 Unspecified atrial fibrillation: Secondary | ICD-10-CM | POA: Diagnosis not present

## 2013-03-01 DIAGNOSIS — I517 Cardiomegaly: Secondary | ICD-10-CM | POA: Diagnosis not present

## 2013-03-01 DIAGNOSIS — I369 Nonrheumatic tricuspid valve disorder, unspecified: Secondary | ICD-10-CM | POA: Diagnosis not present

## 2013-03-01 MED ORDER — PRAVASTATIN SODIUM 40 MG PO TABS: 40.0000 mg | ORAL_TABLET | Freq: Every day | ORAL | Status: DC

## 2013-03-01 NOTE — Telephone Encounter (Signed)
Hopp please advise, patient's LDLP was over 2000.

## 2013-03-02 DIAGNOSIS — I498 Other specified cardiac arrhythmias: Secondary | ICD-10-CM | POA: Diagnosis not present

## 2013-03-02 DIAGNOSIS — Z5181 Encounter for therapeutic drug level monitoring: Secondary | ICD-10-CM | POA: Diagnosis not present

## 2013-03-02 DIAGNOSIS — Z452 Encounter for adjustment and management of vascular access device: Secondary | ICD-10-CM | POA: Diagnosis not present

## 2013-03-02 DIAGNOSIS — Z96659 Presence of unspecified artificial knee joint: Secondary | ICD-10-CM | POA: Diagnosis not present

## 2013-03-02 DIAGNOSIS — R918 Other nonspecific abnormal finding of lung field: Secondary | ICD-10-CM | POA: Diagnosis not present

## 2013-03-02 DIAGNOSIS — I495 Sick sinus syndrome: Secondary | ICD-10-CM | POA: Diagnosis present

## 2013-03-02 DIAGNOSIS — Z9089 Acquired absence of other organs: Secondary | ICD-10-CM | POA: Diagnosis not present

## 2013-03-02 DIAGNOSIS — I455 Other specified heart block: Secondary | ICD-10-CM | POA: Diagnosis present

## 2013-03-02 DIAGNOSIS — E785 Hyperlipidemia, unspecified: Secondary | ICD-10-CM | POA: Diagnosis not present

## 2013-03-02 DIAGNOSIS — R82998 Other abnormal findings in urine: Secondary | ICD-10-CM | POA: Diagnosis not present

## 2013-03-02 DIAGNOSIS — Z79899 Other long term (current) drug therapy: Secondary | ICD-10-CM | POA: Diagnosis not present

## 2013-03-02 DIAGNOSIS — Z7901 Long term (current) use of anticoagulants: Secondary | ICD-10-CM | POA: Diagnosis not present

## 2013-03-02 DIAGNOSIS — I6990 Unspecified sequelae of unspecified cerebrovascular disease: Secondary | ICD-10-CM | POA: Diagnosis not present

## 2013-03-02 DIAGNOSIS — J9383 Other pneumothorax: Secondary | ICD-10-CM | POA: Diagnosis not present

## 2013-03-02 DIAGNOSIS — I515 Myocardial degeneration: Secondary | ICD-10-CM | POA: Diagnosis not present

## 2013-03-02 DIAGNOSIS — Z884 Allergy status to anesthetic agent status: Secondary | ICD-10-CM | POA: Diagnosis not present

## 2013-03-02 DIAGNOSIS — I9589 Other hypotension: Secondary | ICD-10-CM | POA: Diagnosis not present

## 2013-03-02 DIAGNOSIS — E039 Hypothyroidism, unspecified: Secondary | ICD-10-CM | POA: Diagnosis not present

## 2013-03-02 DIAGNOSIS — Z9079 Acquired absence of other genital organ(s): Secondary | ICD-10-CM | POA: Diagnosis not present

## 2013-03-02 DIAGNOSIS — I4891 Unspecified atrial fibrillation: Secondary | ICD-10-CM | POA: Diagnosis not present

## 2013-03-02 DIAGNOSIS — I319 Disease of pericardium, unspecified: Secondary | ICD-10-CM | POA: Diagnosis present

## 2013-03-02 DIAGNOSIS — Z853 Personal history of malignant neoplasm of breast: Secondary | ICD-10-CM | POA: Diagnosis not present

## 2013-03-02 DIAGNOSIS — R609 Edema, unspecified: Secondary | ICD-10-CM | POA: Diagnosis not present

## 2013-03-02 DIAGNOSIS — I517 Cardiomegaly: Secondary | ICD-10-CM | POA: Diagnosis not present

## 2013-03-02 DIAGNOSIS — Z9889 Other specified postprocedural states: Secondary | ICD-10-CM | POA: Diagnosis not present

## 2013-03-02 DIAGNOSIS — J90 Pleural effusion, not elsewhere classified: Secondary | ICD-10-CM | POA: Diagnosis not present

## 2013-03-02 DIAGNOSIS — N390 Urinary tract infection, site not specified: Secondary | ICD-10-CM | POA: Diagnosis not present

## 2013-03-02 DIAGNOSIS — I959 Hypotension, unspecified: Secondary | ICD-10-CM | POA: Diagnosis not present

## 2013-03-10 HISTORY — PX: PACEMAKER INSERTION: SHX728

## 2013-03-22 ENCOUNTER — Encounter: Payer: Self-pay | Admitting: Internal Medicine

## 2013-03-22 ENCOUNTER — Ambulatory Visit (INDEPENDENT_AMBULATORY_CARE_PROVIDER_SITE_OTHER): Payer: Medicare Other | Admitting: *Deleted

## 2013-03-22 DIAGNOSIS — R55 Syncope and collapse: Secondary | ICD-10-CM

## 2013-03-22 DIAGNOSIS — I4891 Unspecified atrial fibrillation: Secondary | ICD-10-CM

## 2013-03-22 LAB — PACEMAKER DEVICE OBSERVATION
AL AMPLITUDE: 2.625 mv
AL IMPEDENCE PM: 513 Ohm
AL THRESHOLD: 1 V
ATRIAL PACING PM: 1
RV LEAD IMPEDENCE PM: 513 Ohm
RV LEAD THRESHOLD: 1.25 V
VENTRICULAR PACING PM: 0.15

## 2013-03-22 NOTE — Progress Notes (Signed)
Pt seen in device clinic for follow up of recently implanted pacemaker.  Wound well healed.  No redness, swelling, or edema.  Staples removed today, steri-strips put on.   Device interrogated and found to be functioning normally.   See PaceArt for full details.  Pt to follow up with Dr. Graciela Husbands tomorrow.   Shuan Statzer 03/22/2013 5:15 PM

## 2013-03-23 ENCOUNTER — Encounter: Payer: Self-pay | Admitting: Cardiology

## 2013-03-23 ENCOUNTER — Other Ambulatory Visit (HOSPITAL_COMMUNITY): Payer: Medicare Other

## 2013-03-23 ENCOUNTER — Ambulatory Visit (INDEPENDENT_AMBULATORY_CARE_PROVIDER_SITE_OTHER): Payer: Medicare Other | Admitting: Cardiology

## 2013-03-23 VITALS — BP 132/78 | HR 85 | Ht 66.0 in | Wt 216.4 lb

## 2013-03-23 DIAGNOSIS — R5383 Other fatigue: Secondary | ICD-10-CM

## 2013-03-23 DIAGNOSIS — R002 Palpitations: Secondary | ICD-10-CM | POA: Diagnosis not present

## 2013-03-23 DIAGNOSIS — R0609 Other forms of dyspnea: Secondary | ICD-10-CM | POA: Diagnosis not present

## 2013-03-23 DIAGNOSIS — I4891 Unspecified atrial fibrillation: Secondary | ICD-10-CM

## 2013-03-23 DIAGNOSIS — Z8679 Personal history of other diseases of the circulatory system: Secondary | ICD-10-CM

## 2013-03-23 DIAGNOSIS — R5381 Other malaise: Secondary | ICD-10-CM

## 2013-03-23 DIAGNOSIS — Z9889 Other specified postprocedural states: Secondary | ICD-10-CM

## 2013-03-23 DIAGNOSIS — D649 Anemia, unspecified: Secondary | ICD-10-CM

## 2013-03-23 DIAGNOSIS — R0989 Other specified symptoms and signs involving the circulatory and respiratory systems: Secondary | ICD-10-CM

## 2013-03-23 DIAGNOSIS — Z950 Presence of cardiac pacemaker: Secondary | ICD-10-CM

## 2013-03-23 DIAGNOSIS — R001 Bradycardia, unspecified: Secondary | ICD-10-CM

## 2013-03-23 DIAGNOSIS — I498 Other specified cardiac arrhythmias: Secondary | ICD-10-CM

## 2013-03-23 DIAGNOSIS — J90 Pleural effusion, not elsewhere classified: Secondary | ICD-10-CM

## 2013-03-23 LAB — CBC WITH DIFFERENTIAL/PLATELET
Basophils Absolute: 0.1 10*3/uL (ref 0.0–0.1)
Basophils Relative: 0.6 % (ref 0.0–3.0)
Eosinophils Absolute: 0.5 10*3/uL (ref 0.0–0.7)
Hemoglobin: 11.2 g/dL — ABNORMAL LOW (ref 12.0–15.0)
Lymphocytes Relative: 15.1 % (ref 12.0–46.0)
Lymphs Abs: 1.4 10*3/uL (ref 0.7–4.0)
MCHC: 32.5 g/dL (ref 30.0–36.0)
MCV: 90.4 fl (ref 78.0–100.0)
Monocytes Absolute: 1 10*3/uL (ref 0.1–1.0)
Neutro Abs: 6.4 10*3/uL (ref 1.4–7.7)
RBC: 3.82 Mil/uL — ABNORMAL LOW (ref 3.87–5.11)
RDW: 16.5 % — ABNORMAL HIGH (ref 11.5–14.6)

## 2013-03-23 LAB — BASIC METABOLIC PANEL
BUN: 24 mg/dL — ABNORMAL HIGH (ref 6–23)
Calcium: 9.5 mg/dL (ref 8.4–10.5)
Creatinine, Ser: 1.3 mg/dL — ABNORMAL HIGH (ref 0.4–1.2)
GFR: 42.07 mL/min — ABNORMAL LOW (ref >60.00)

## 2013-03-23 NOTE — Patient Instructions (Addendum)
Your physician has requested that you have an echocardiogram. Echocardiography is a painless test that uses sound waves to create images of your heart. It provides your doctor with information about the size and shape of your heart and how well your heart's chambers and valves are working. This procedure takes approximately one hour. There are no restrictions for this procedure.  A chest x-ray takes a picture of the organs and structures inside the chest, including the heart, lungs, and blood vessels. This test can show several things, including, whether the heart is enlarges; whether fluid is building up in the lungs; and whether pacemaker / defibrillator leads are still in place.   Your physician recommends that you return for lab work in: BMET, CBC  Your physician has recommended you make the following change in your medication:   STOP LASIX

## 2013-03-24 DIAGNOSIS — I499 Cardiac arrhythmia, unspecified: Secondary | ICD-10-CM | POA: Diagnosis not present

## 2013-03-24 DIAGNOSIS — Z09 Encounter for follow-up examination after completed treatment for conditions other than malignant neoplasm: Secondary | ICD-10-CM | POA: Diagnosis not present

## 2013-03-24 DIAGNOSIS — J9819 Other pulmonary collapse: Secondary | ICD-10-CM | POA: Diagnosis not present

## 2013-03-24 DIAGNOSIS — J90 Pleural effusion, not elsewhere classified: Secondary | ICD-10-CM | POA: Diagnosis not present

## 2013-03-24 DIAGNOSIS — Z79899 Other long term (current) drug therapy: Secondary | ICD-10-CM | POA: Diagnosis not present

## 2013-03-24 DIAGNOSIS — R9431 Abnormal electrocardiogram [ECG] [EKG]: Secondary | ICD-10-CM | POA: Diagnosis not present

## 2013-03-24 DIAGNOSIS — I4891 Unspecified atrial fibrillation: Secondary | ICD-10-CM | POA: Diagnosis not present

## 2013-03-24 DIAGNOSIS — Z9889 Other specified postprocedural states: Secondary | ICD-10-CM | POA: Diagnosis not present

## 2013-03-24 NOTE — Progress Notes (Signed)
ELECTROPHYSIOLOGY OFFICE NOTE  Patient ID: Kayla Harrison MRN: [id number], DOB/AGE: 1945/12/10   Date of Visit: 03/24/2013  Primary Physician: Marga Melnick, MD Primary Cardiologist: Berton Mount, MD Reason for Visit: Hospital follow-up  History of Present Illness  Kayla Harrison is a 67 y.o. female with persistent AF s/p recent convergent ablation procedure performed at [facility] 03/02/2013 who presents today for hospital followup. She reports she is not doing well and has multiple complaints. She has experienced persistent fatigue, DOE, dry cough, insomnia and palpitations since hospital discharge on 03/12/2013. Her convergent ablation procedure was complicated by recurrent rapid AF, right-sided pleural effusion, anemia and bradycardia requiring PPM implantation. According to records from [facility], she developed recurrent rapid AF on post op day #3 and she was started on Tikosyn; however, she had QT prolongation and this was stopped. She was then restarted on amiodarone. Diltiazem was continued. She had both sinus bradycardia and 2:1 AV block which limited up-titration of her rate controlling medication.   She states the PPM implant site is healing well. She has several questions regarding her clinical course at [facility], activity restrictions post implant, wound care and follow-up.  She denies chest pain. She denies near syncope or syncope. She denies LE swelling, orthopnea, PND or recent weight gain. She is compliant with medications.  Past Medical History Past Medical History  Diagnosis Date  . Morbid obesity     Status post lap band surgery  . Hx of thyroid cancer     Dr Evlyn Kanner  . Colon polyp     Dr Kinnie Scales  . Dyslipidemia   . Hyperlipidemia   . Hypothyroidism   . Breast cancer     Dr Jamey Ripa, total thyroidectomy- 1999- for cancer  . Pneumonia     2010- hosp. here [facility]- during that event had to be cardioverted   . Arthritis     osteoarthritis - knees   . Normal cardiac stress test     2008- cardiac stress/echo  . Stroke     2003- [place]   . Atrial fibrillation     ablation- 2x's-- 1st time- [facility], 2nd event at [facility] in 2008. Dr. Graciela Husbands- [place]- sees pt. once /yr. ,  . Complication of anesthesia     Ketamine produces LSD reaction, bright colored nightmarish experience   . Hepatitis     [place] ?[place]     Past Surgical History Past Surgical History  Procedure Laterality Date  . Thyroidectomy  1998    Dr Jamey Ripa  . Bso  1998  . Cholecystectomy    . Tonsillectomy    . Colonoscopy w/ polypectomy      Dr Kinnie Scales  . Afib ablation      X 2; [facility] & Dr Graciela Husbands  . Abdominal hysterectomy  1983  . Breast lumpectomy  2010  . Laparoscopic gastric banding  07/10/2010  . Appendectomy    . Knee arthroscopy      bilateral  . Laparotomy      for ruptured ovary and ovarian artery   . Total knee arthroplasty  04/13/2012    Procedure: TOTAL KNEE ARTHROPLASTY;  Surgeon: Raymon Mutton, MD;  Location: [facility] OR;  Service: Orthopedics;  Laterality: Right;  . Joint replacement  04/13/12    Right Knee replacement  . Cardioversion  10/09/2012    Procedure: CARDIOVERSION;  Surgeon: Rollene Rotunda, MD;  Location: [facility] OR;  Service: Cardiovascular;  Laterality: N/A;  . Cardioversion  10/09/2012    Procedure: CARDIOVERSION;  Surgeon: Rollene Rotunda, MD;  Location: [facility] ENDOSCOPY;  Service: Cardiovascular;  Laterality: N/A;  Kathlene November gave the ok to add pt to the add on , but we must check to find out if the can add pt on at 1400 ( [phone])  . Cardioversion N/A 11/20/2012    Procedure: CARDIOVERSION;  Surgeon: Pricilla Riffle, MD;  Location: [facility] ENDOSCOPY;  Service: Cardiovascular;  Laterality: N/A;    Allergies/Intolerances Allergies  Allergen Reactions  . Xarelto (Rivaroxaban) Hives    Nose Bleed X 6 hrs ; packing in ER  . Epinephrine Other (See Comments) and Rash    Dental form only (liquid). Patient stated she will become "out of it" she can hear you but  cannot respond in normal fashion.   Current Home Medications Current Outpatient Prescriptions  Medication Sig Dispense Refill  . acetaminophen-codeine (TYLENOL #3) 300-30 MG per tablet Take 1 tablet by mouth every 4 (four) hours as needed for pain.      Marland Kitchen amiodarone (PACERONE) 200 MG tablet Take 200 mg by mouth daily.      Marland Kitchen apixaban (ELIQUIS) 5 MG TABS tablet Take 2.5 mg by mouth 2 (two) times daily.      . Cholecalciferol (VITAMIN D3) 1000 UNITS CAPS Take 1 capsule by mouth daily.       Marland Kitchen diltiazem (CARDIZEM CD) 120 MG 24 hr capsule Take 1 capsule (120 mg total) by mouth 2 (two) times daily.  60 capsule  6  . docusate sodium (COLACE) 100 MG capsule Take 100 mg by mouth 2 (two) times daily.      . ferrous sulfate 325 (65 FE) MG tablet Take 325 mg by mouth 2 (two) times daily.      Marland Kitchen levothyroxine (SYNTHROID, LEVOTHROID) 150 MCG tablet Take 150 mcg by mouth daily before breakfast.      . POTASSIUM CHLORIDE PO Take 10 mEq by mouth daily.      . pravastatin (PRAVACHOL) 40 MG tablet Take 1 tablet (40 mg total) by mouth daily.  90 tablet  0   No current facility-administered medications for this visit.   Social History Social History  . Marital Status: Single   Social History Main Topics  . Smoking status: Never Smoker   . Smokeless tobacco: Never Used  . Alcohol Use: No  . Drug Use: No   Review of Systems General: +fatigue  No chills, fever, night sweats or weight changes Cardiovascular: +DOE +palpitations  No chest pain, edema, orthopnea, paroxysmal nocturnal dyspnea Dermatological: No rash, lesions or masses Respiratory: No cough Urologic: No hematuria, dysuria Abdominal: No nausea, vomiting, diarrhea, bright red blood per rectum, melena, or hematemesis Neurologic: No visual changes, changes in mental status All other systems reviewed and are otherwise negative except as noted above.  Physical Exam Vitals: Blood pressure 132/78, pulse 85, height 5\' 6"  (1.676 m), weight 216 lb  6.4 oz (98.158 kg), SpO2 99.00%.  General: Well developed, pale  67 y.o. female in no acute distress. HEENT: Normocephalic, atraumatic. EOMs intact. Sclera nonicteric. Oropharynx clear.  Neck: Supple. No JVD. Lungs: Respirations regular and unlabored. Diminished breath sounds at right base otherwise CTA bilaterally. No wheezes, rales or rhonchi. Heart: Regular. S1, S2 present. No murmurs, rub, S3 or S4. Abdomen: Soft, non-tender, non-distended. BS present x 4 quadrants. No hepatosplenomegaly.  Extremities: No clubbing, cyanosis or edema. DP/PT/Radials 2+ and equal bilaterally. Psych: Normal affect. Neuro: Alert and oriented X 3. Moves all extremities spontaneously. Skin: Left  upper chest implant site intact and well healed. No evidence of inflammation or infection.   Diagnostics Device interrogation - performed yesterday - normal PPM function; histograms right shifted Pertinent labs from [facility] at time of discharge - Hgb 9.1, Hct 29.1, Plt 254,000, serum Cr 1.06, potassium 3.9  Assessment and Plan 1. DOE and fatigue 2. Palpitations with recurrent AF 3. Status post recent convergent ablation procedure at [facility] 03/02/2013 4. Bradycardia s/p PPM implant 03/10/2013 5. Anemia 6. Pleural effusion  Ms. Edwinna Areola has h/o normal LV function and recent cardiac catheterization May 2014 (done in preparation for convergent ablation) demonstrated no obstructive coronary disease and normal right-sided pressures. She now presents 3 weeks post convergent ablation procedure with persistent fatigue, DOE, cough and palpitations. Her convergent ablation procedure was complicated by recurrent rapid AF, right-sided pleural effusion, anemia and bradycardia requiring PPM implantation. Her PPM function is normal by device interrogation done yesterday. On exam, she appears pale and dry. We will order a CXR to rule out worsening pleural effusion and a transthoracic echocardiogram to evaluate her LV function. We will order  CBC to rule out worsening anemia and a BMET to evaluate renal function and electrolytes. She was counseled regarding activity restrictions and wound care. She also had several questions regarding her clinical course at [facility] which was reviewed with her in detail according to the DC summary. Approximately 40 minutes was spent counseling and answering questions for [name]. In addition, her implant site is intact and well healed. Steri-strips were removed from the left upper chest/implant site today without difficulty. Further recommendations will be made in the next 24-48 hours, pending the results of the above mentioned studies.  This plan of care was formulated with Dr. Graciela Husbands who was in to interview and examine the patient. Signed, Rick Duff, PA-C 03/24/2013, 10:13 PM

## 2013-03-29 ENCOUNTER — Telehealth: Payer: Self-pay | Admitting: Cardiology

## 2013-03-29 ENCOUNTER — Telehealth: Payer: Self-pay | Admitting: *Deleted

## 2013-03-29 NOTE — Telephone Encounter (Signed)
Spoke with Ms. Schaick via phone today for follow-up regarding symptoms of fatigue and weakness. She reports her symptoms are improving. She is now walking laps around the parking lot where she lives 2-4 times daily. She would like to start back her water aerobics class. I reviewed her labs from 03/23/2013 with her. Her renal function is stable. Potassium and sodium levels are within normal range. Hgb and Hct are improving since hospital discharge. She reports her PPM implant site is well healed. She has noticed at times that the PPM seems to "drop or lower" in her chest when walking but this has not been a big problem for her. I reassured her that this can occur over time as the [facility] generator settles into the pocket. I ordered a chest x-ray last week but this has not been done here. She tells me she had a CXR done at [facility] on 03/24/2013 and it revealed only a small pleural effusion by her report. She has an echocardiogram scheduled in our office tomorrow, 03/30/2013. Further recommendations will be made pending the results of her echo.

## 2013-03-29 NOTE — Telephone Encounter (Signed)
Spoke with pt, aware handicap plaqued and insurance paperwork placed at the front desk for pick up

## 2013-03-30 ENCOUNTER — Ambulatory Visit (HOSPITAL_COMMUNITY): Payer: Medicare Other | Attending: Cardiology | Admitting: Radiology

## 2013-03-30 DIAGNOSIS — R0602 Shortness of breath: Secondary | ICD-10-CM | POA: Insufficient documentation

## 2013-03-30 DIAGNOSIS — E669 Obesity, unspecified: Secondary | ICD-10-CM | POA: Diagnosis not present

## 2013-03-30 DIAGNOSIS — I4891 Unspecified atrial fibrillation: Secondary | ICD-10-CM | POA: Diagnosis not present

## 2013-03-30 NOTE — Progress Notes (Signed)
Echocardiogram performed.  

## 2013-04-01 ENCOUNTER — Telehealth: Payer: Self-pay | Admitting: Internal Medicine

## 2013-04-01 NOTE — Telephone Encounter (Signed)
Spoke with pt, aware dr Graciela Husbands has not reviewed yet. Preliminary report given but will call once reviewed. Pt voiced understanding

## 2013-04-01 NOTE — Telephone Encounter (Signed)
Follow Up     Pt calling in following up on test results. Please call.

## 2013-04-07 ENCOUNTER — Encounter: Payer: Self-pay | Admitting: Internal Medicine

## 2013-04-07 ENCOUNTER — Ambulatory Visit (INDEPENDENT_AMBULATORY_CARE_PROVIDER_SITE_OTHER): Payer: Medicare Other | Admitting: Internal Medicine

## 2013-04-07 VITALS — BP 120/78 | HR 87 | Wt 218.0 lb

## 2013-04-07 DIAGNOSIS — R059 Cough, unspecified: Secondary | ICD-10-CM

## 2013-04-07 DIAGNOSIS — J209 Acute bronchitis, unspecified: Secondary | ICD-10-CM | POA: Diagnosis not present

## 2013-04-07 DIAGNOSIS — R05 Cough: Secondary | ICD-10-CM | POA: Diagnosis not present

## 2013-04-07 DIAGNOSIS — J90 Pleural effusion, not elsewhere classified: Secondary | ICD-10-CM | POA: Diagnosis not present

## 2013-04-07 MED ORDER — DOXYCYCLINE HYCLATE 100 MG PO TABS: 100.0000 mg | ORAL_TABLET | Freq: Two times a day (BID) | ORAL | Status: DC

## 2013-04-07 MED ORDER — HYDROCODONE-HOMATROPINE 5-1.5 MG/5ML PO SYRP: 5.0000 mL | ORAL_SOLUTION | Freq: Four times a day (QID) | ORAL | Status: DC | PRN

## 2013-04-07 NOTE — Progress Notes (Signed)
  Subjective:    Patient ID: Kayla Harrison, female    DOB: 1946-02-08, 67 y.o.   MRN: [id number]  HPI Cough initially appeared "immediately" after transabdominal ablation which lasted 10 hrs  5 weeks ago . She has had a small post op pleural effusion on R. Last Xray 2 weeks ago . Incentive Spirometry D/Ced 2 weeks ago. The cough was described as dry until today. It is now productive of slightly yellow  sputum without associated hemoptysis although she "tastes it ". There was no specific trigger such as respiratory tract infection; environmental  ; occupational ; or sick contact exposure other than the procedure requiring 4 days in ICU. She underwent a"Code Blue" x2 postoperatively. Pacer was required due to pulse of 32.   Cough was stable until 7/15 ;it impacted rest last night  Associated signs and symptoms include diffuse headache since 7/15 with sore throat.R earache was present 7/13 as "sensitivity".  Pleuritic pain @ times when supine; unable to lie in LLDP. Some dyspnea and wheezing  noted.  Treatment with Riccola drops & fluids have been only  partially effective.  No PMH of asthma. Patient never smoked No  ACE inhibitor administration:           Review of Systems Negative or absent symptoms/signs are noted below: URI symptoms: No facial pain, frontal headaches, nasal purulence, dental pain, or otic discharge Extrinsic symptoms: No itchy eyes, sneezing, or angioedema Infectious symptoms: No fever, chills, sweats GI symptoms: No dyspepsia, dysphagia, reflux symptoms on Nexium       Objective:   Physical Exam General appearance:well nourished; no acute distress or increased work of breathing is present.  No  lymphadenopathy about the head, neck, or axilla noted.  Eyes: No conjunctival inflammation or lid edema is present. There is no scleral icterus. Ears:  External ear exam shows no significant lesions or deformities.  Otoscopic examination reveals clear canals,  tympanic membranes are intact bilaterally without bulging, retraction, inflammation or discharge. Nose:  External nasal examination shows no deformity or inflammation. Nasal mucosa are pink and moist without lesions or exudates. No septal dislocation or deviation.No obstruction to airflow.  Oral exam: Dental hygiene is good; lips and gums are healthy appearing.There is no oropharyngeal erythema or exudate noted. Slightly hoarse Neck:  No deformities, thyromegaly, masses, or tenderness noted.   Supple with full range of motion without pain.  Heart:  Normal rate and regular rhythm. S1 and S2 normal without gallop, murmur, click, rub or other extra sounds.  Lungs: LLL > RLL rales. No rubs  Or wheezing present.No increased work of breathing.  Inspiratory maneuvers result in paroxysmal cough Extremities:  No cyanosis or clubbing  Noted. Trace edema  Skin: Warm & dry w/o jaundice or tenting.        Assessment & Plan:  #1 protracted cough  #2 pleural effusion #3 acute bronchitis See Orders

## 2013-04-07 NOTE — Patient Instructions (Addendum)
Please  blowup at least 10  balloons a day to enhance inflation of the lungs and prevent atelectasis as we discussed. Chest x-rays  at [address] [facility]. across from [facility] if no better by Friday

## 2013-04-09 ENCOUNTER — Ambulatory Visit (INDEPENDENT_AMBULATORY_CARE_PROVIDER_SITE_OTHER)
Admission: RE | Admit: 2013-04-09 | Discharge: 2013-04-09 | Disposition: A | Discharge status: Home / Self Care | Payer: Medicare Other | Source: Ambulatory Visit | Attending: Internal Medicine | Admitting: Internal Medicine

## 2013-04-09 ENCOUNTER — Encounter: Payer: Self-pay | Admitting: Internal Medicine

## 2013-04-09 DIAGNOSIS — J209 Acute bronchitis, unspecified: Secondary | ICD-10-CM

## 2013-04-09 DIAGNOSIS — J90 Pleural effusion, not elsewhere classified: Secondary | ICD-10-CM | POA: Diagnosis not present

## 2013-04-11 ENCOUNTER — Encounter: Payer: Self-pay | Admitting: Cardiology

## 2013-04-12 ENCOUNTER — Telehealth: Payer: Self-pay | Admitting: *Deleted

## 2013-04-12 NOTE — Telephone Encounter (Signed)
Pt had a chest xray with dr hopper, he wants her to go to [facility]. Spoke with pt, she does not want to go to [facility]. She asked me to get the xray from [facility] for dr Graciela Husbands to compare. cxr report reviewed by dr Graciela Husbands, he would like the pt to come in for a lateral decubitus cxr tomorrow. Pt made aware, will call her back in the morning for location.

## 2013-04-13 ENCOUNTER — Ambulatory Visit (HOSPITAL_COMMUNITY)
Admission: RE | Admit: 2013-04-13 | Discharge: 2013-04-13 | Disposition: A | Discharge status: Home / Self Care | Payer: Medicare Other | Source: Ambulatory Visit | Attending: Internal Medicine | Admitting: Internal Medicine

## 2013-04-13 ENCOUNTER — Other Ambulatory Visit: Payer: Self-pay | Admitting: *Deleted

## 2013-04-13 ENCOUNTER — Encounter: Payer: Self-pay | Admitting: Internal Medicine

## 2013-04-13 DIAGNOSIS — J90 Pleural effusion, not elsewhere classified: Secondary | ICD-10-CM | POA: Diagnosis not present

## 2013-04-13 DIAGNOSIS — R9389 Abnormal findings on diagnostic imaging of other specified body structures: Secondary | ICD-10-CM

## 2013-04-13 DIAGNOSIS — R918 Other nonspecific abnormal finding of lung field: Secondary | ICD-10-CM | POA: Insufficient documentation

## 2013-04-13 NOTE — Telephone Encounter (Signed)
Discussed with dr Graciela Husbands, pt will go to [place] for a decubitus cxr. Pt aware.

## 2013-04-14 ENCOUNTER — Telehealth: Payer: Self-pay | Admitting: Internal Medicine

## 2013-04-14 DIAGNOSIS — J90 Pleural effusion, not elsewhere classified: Secondary | ICD-10-CM

## 2013-04-14 NOTE — Telephone Encounter (Signed)
Follow Up     Following up on results from xray.

## 2013-04-14 NOTE — Telephone Encounter (Signed)
Spoke with Kayla Harrison, aware the right effusion is moderate and that there is a small effusion on the left. She has finished all the antibiotics and is now out of cough syrup. She is wondering if she needs to have the effusion tapped. Aware have sent a message to dr Graciela Husbands at the hosp to look at the xray. Kayla Harrison agreed with this plan.

## 2013-04-14 NOTE — Telephone Encounter (Signed)
Spoke with dr Graciela Husbands, dr Sandria Manly with pulmonary has reviewed the pts cxr and feels the right effusion will need to be tapped. The department is closed now. Spoke with pt, she is aware of above, she would like to get this done tomorrow if that is possible because she is getting more SOB. Pt aware I am not here tomorrow but will forward to Sherri Rad RN and have her call interventional radiology to schedule.

## 2013-04-14 NOTE — Telephone Encounter (Signed)
My smart text for thora  The fluid removal will be done by Interventional radiology   - they will remove not more than 1.5L - can remove more slowly  - fluid labs to be sent: cell count, gram stain and culture, cytology for malignant cells, chemistries for LDH, albumin,  Protein, glucose, triglyceride and lipase  - blood labs to be sent on same day / time: cbc, ldh, protein, albumin glucose, and lipase  Dr. Kalman Shan, M.D., [facility].C.P Pulmonary and Critical Care Medicine Staff Physician [facility] [place] [facility] Critical Care Pager: [phone], If no answer or between  15:00h - 7:00h: call [phone]  04/14/2013 5:19 PM

## 2013-04-15 ENCOUNTER — Telehealth: Payer: Self-pay

## 2013-04-15 ENCOUNTER — Other Ambulatory Visit: Payer: Self-pay | Admitting: *Deleted

## 2013-04-15 ENCOUNTER — Encounter: Payer: Self-pay | Admitting: Internal Medicine

## 2013-04-15 DIAGNOSIS — J209 Acute bronchitis, unspecified: Secondary | ICD-10-CM

## 2013-04-15 DIAGNOSIS — J90 Pleural effusion, not elsewhere classified: Secondary | ICD-10-CM

## 2013-04-15 MED ORDER — HYDROCODONE-HOMATROPINE 5-1.5 MG/5ML PO SYRP: 5.0000 mL | ORAL_SOLUTION | Freq: Four times a day (QID) | ORAL | Status: DC | PRN

## 2013-04-15 NOTE — Telephone Encounter (Signed)
The patient will see Dr. Craige Cotta tomorrow at 3:45pm. The patient is aware and agreeable. I spoke with Lyla Son in radiology scheduling. US guided thoracentesis cancelled.

## 2013-04-15 NOTE — Telephone Encounter (Signed)
Per a call back from [place] in radiology scheduling, the patient's procedure cannot be done today. The PA is still trying to find out about eliquis and if there is a need to hold this. Per Lyla Son, they will contact the patient directly and let her know when this can be done. Sherri Rad, RN, BSN  I called the patient and made her aware that radiology will be contacting her directly to schedule. She stated that she did not want to be misunderstood that she wants the thoracentesis. Per the patient, she does not want this if this can be avoided. She states she is not really SOB, but she does have a persistent cough. She was on antibiotics per Dr. Alwyn Ren and this didn't really help her but to change the color of her mucus from yellow to clear. She was on cough syrup that helped but did not completely relieve her cough. She is persistently coughing on the phone while we are speaking. I advised that thoracentesis may help with the persistent cough she is having. She is a little upset that no doctor has called her a talked with her about this. I advised I will give Dr. Graciela Husbands her number and have him call her this afternoon. She is agreeable. I also advised that her pursing this procedure is completely up to her and that she will have to sign consent for this. She will contact Dr. Alwyn Ren today to have him refill her cough medication in the interim. Sherri Rad, RN, BSN

## 2013-04-15 NOTE — Telephone Encounter (Signed)
I have called the radiology department to try to schedule ultrasound guided thoracentesis. I have requested per the patient that this be done today. They have paged the PA in radiology to ask about the patient being on Eliquis and will this interfere. They will call me back.

## 2013-04-15 NOTE — Telephone Encounter (Signed)
Per call back from [place] in radiology, the patient will need to hold Eliquis for 2 days prior to procedure. They have scheduled her for Monday 7/28 at 11:00 am. She is to arrive at radiology on the 1 st floor of the hospital at 10:45 am. She asked I call the patient with the details due to the Eliquis issue. The patient wants to speak with Dr. Graciela Husbands as well. I will have him call her first.

## 2013-04-15 NOTE — Telephone Encounter (Signed)
OK X1 

## 2013-04-15 NOTE — Telephone Encounter (Signed)
MSG from the patient who is requesting a refill on her Hydromet. She stated her cough is not improving and her US THORACENTESIS ASP PLEURAL SPACE BW/IMG GUIDE is scheduled for Monday 7/28. She was last seen 04/07/13 and this is when Hydromet was prescribed. Please advise     KP

## 2013-04-15 NOTE — Telephone Encounter (Signed)
RX called in .

## 2013-04-15 NOTE — Telephone Encounter (Signed)
Dr. Graciela Husbands and spoke with the patient. Will rule out CHF symptoms with low dose diuresis - lasix 40 mg one tablet by mouth every other day. Will refer to pulmonary for evaluation of persistant cough and pleural effusion prior to pursuing ultrasound guided thoracentesis.

## 2013-04-16 ENCOUNTER — Ambulatory Visit (INDEPENDENT_AMBULATORY_CARE_PROVIDER_SITE_OTHER): Payer: Medicare Other | Admitting: Pulmonary Disease

## 2013-04-16 ENCOUNTER — Encounter: Payer: Self-pay | Admitting: Pulmonary Disease

## 2013-04-16 VITALS — BP 118/72 | HR 77 | Temp 98.5°F | Ht 66.0 in | Wt 220.0 lb

## 2013-04-16 DIAGNOSIS — J90 Pleural effusion, not elsewhere classified: Secondary | ICD-10-CM | POA: Diagnosis not present

## 2013-04-16 NOTE — Progress Notes (Signed)
Chief Complaint  Patient presents with  . Pulmonary Consult    referred by Dr. Graciela Husbands for Pleural Effusion    History of Present Illness: Kayla Harrison is a 67 y.o. female for evaluation of pleural effusion.  She has a history of atrial fibrillation.  She had epicardial ablation via transabdominal approach with endocardial ablation for this at [facility] on June 10.  She has not felt well since then.  The procedure required general anesthesia, and she remained on the ventilator for some time post-op.  She was in ICU for 10 days after the procedure.  She was also found to have sick sinus syndrome, and had pacer inserted in her left chest wall.  She was also treated for a Klebsiella UTI.  She did have a right IJ central venous catheter placed, but this appears to have been placed after she developed pleural effusion.  She has been getting cough with yellow sputum.  She was treated with doxycycline, and her sputum has turned more clear.  She denies hemoptysis, fever, chills, or sweats.  She has a terrible pain along her lower right ribs wrapping around to her back.  She has been getting sinus congestion lately, but denies sore throat or difficulty swallowing.  She also denies reflux.  She notices she feels full all the time.  She has lost 13 lbs since her hospital stay.  She denies abdominal pain, nausea, vomiting, or diarrhea.  She never smoked cigarettes.  She had pneumonia 4 years ago.  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  She reports her previous PPD's were always negative.  She is original from [place], but has been living in [place] for years.  Chest xray from January 2014 was clear.  Chest xray from February 2014 showed a subtle right lower patchy infiltrate.  Chest xray from March 01, 2013 is reported as clear from [facility].  Subsequent chest xrays showed progressive Rt > Lt pleural effusions.   Kayla Harrison  has a  past medical history of Morbid obesity; thyroid cancer; Colon polyp; Dyslipidemia; Hyperlipidemia; Hypothyroidism; Breast cancer; Pneumonia; Arthritis; Normal cardiac stress test; Stroke; Atrial fibrillation; Complication of anesthesia; and Hepatitis.  Kayla Harrison  has past surgical history that includes Thyroidectomy (1998); bso (1998); Cholecystectomy; Tonsillectomy; Colonoscopy w/ polypectomy; afib ablation; Abdominal hysterectomy (1983); Breast lumpectomy (2010); Laparoscopic gastric banding (07/10/2010); Appendectomy; Knee arthroscopy; laparotomy; Total knee arthroplasty (04/13/2012); Joint replacement (04/13/12); Cardioversion (10/09/2012); Cardioversion (10/09/2012); Cardioversion (N/A, 11/20/2012); Transabdominal ablation (03/02/13); and Pacemaker insertion (03/10/13).  Prior to Admission medications   Medication Sig Start Date End Date Taking? Authorizing Provider  acetaminophen-codeine (TYLENOL #3) 300-30 MG per tablet Take 1 tablet by mouth every 4 (four) hours as needed for pain.    Historical Provider, MD  amiodarone (PACERONE) 200 MG tablet Take 200 mg by mouth daily.    Historical Provider, MD  amiodarone (PACERONE) 400 MG tablet Take 400 mg by mouth daily.    Historical Provider, MD  apixaban (ELIQUIS) 5 MG TABS tablet Take 5 mg by mouth 2 (two) times daily.     Historical Provider, MD  Cholecalciferol (VITAMIN D3) 1000 UNITS CAPS Take 1 capsule by mouth daily.     Historical Provider, MD  diltiazem (CARDIZEM CD) 120 MG 24 hr capsule Take 1 capsule (120 mg total) by mouth 2 (two) times daily. 11/20/12   Duke Salvia, MD  HYDROcodone-homatropine (HYDROMET) 5-1.5  MG/5ML syrup Take 5 mLs by mouth every 6 (six) hours as needed for cough. 04/15/13   Pecola Lawless, MD  levothyroxine (SYNTHROID, LEVOTHROID) 150 MCG tablet Take 150 mcg by mouth daily before breakfast.    Historical Provider, MD  POTASSIUM CHLORIDE PO Take 10 mEq by mouth daily.    Historical Provider, MD  pravastatin  (PRAVACHOL) 40 MG tablet Take 1 tablet (40 mg total) by mouth daily. 03/01/13   Pecola Lawless, MD    Allergies  Allergen Reactions  . Tikosyn (Dofetilide)     Prolonged QT interval  . Xarelto (Rivaroxaban)     Nose Bleed X 6 hrs ; packing in ER  . Epinephrine Other (See Comments) and Rash    Dental form only (liquid). Patient stated she will become "out of it" she can hear you but cannot respond in normal fashion.  Marland Kitchen Ketamine     Hallucinations    Her family history includes Cancer in her father; Diabetes in her brother, paternal aunt, paternal grandmother, and sister; and Heart disease (age of onset: 13) in her father.  She  reports that she has never smoked. She has never used smokeless tobacco. She reports that she does not drink alcohol or use illicit drugs.  Review of Systems  Constitutional: Negative for fever, chills, diaphoresis, activity change, appetite change, fatigue and unexpected weight change.  HENT: Negative for hearing loss, ear pain, nosebleeds, congestion, sore throat, facial swelling, rhinorrhea, sneezing, mouth sores, trouble swallowing, neck pain, neck stiffness, dental problem, voice change, postnasal drip, sinus pressure, tinnitus and ear discharge.   Eyes: Negative for photophobia, discharge, redness, itching and visual disturbance.  Respiratory: Positive for cough and shortness of breath. Negative for apnea, choking, chest tightness, wheezing and stridor.   Cardiovascular: Positive for chest pain. Negative for palpitations and leg swelling.  Gastrointestinal: Positive for abdominal fullness. Negative for nausea, vomiting, constipation, blood in stool and abdominal distention.  Genitourinary: Negative for dysuria, urgency, frequency, hematuria, flank pain, decreased urine volume and difficulty urinating.  Musculoskeletal: Negative for myalgias, back pain, joint swelling, arthralgias and gait problem.  Skin: Negative for color change, pallor and rash.   Neurological: Negative for dizziness, tremors, seizures, syncope, speech difficulty, weakness, light-headedness, numbness and headaches.  Hematological: Negative for adenopathy. Does not bruise/bleed easily.  Psychiatric/Behavioral: Negative for confusion, sleep disturbance, dysphoric mood and agitation. The patient is not nervous/anxious.    Physical Exam:  General - No distress ENT - No sinus tenderness, no oral exudate, no LAN, no thyromegaly, TM clear, pupils equal/reactive Cardiac - s1s2 regular, no murmur, pulses symmetric Chest - decreased respiratory excursion due to splinting on right, decreased breath sounds with dullness on right, no wheeze, pacer in Lt upper chest no tenderness or redness Back - No focal tenderness Abd - Soft, non-tender, no organomegaly, + bowel sounds, mid-line upper abdominal scar  Ext - No edema Neuro - Normal strength, cranial nerves intact Skin - No rashes Psych - Normal mood, and behavior   Dg Chest Bilateral Decubitus  04/13/2013   *RADIOLOGY REPORT*  Clinical Data: Effusion.  Recent abnormal chest radiograph.  CHEST - BILATERAL DECUBITUS VIEW  Comparison: None.  Findings: Bilateral decubitus views of the chest demonstrate bilateral simple appearing layering pleural effusions, larger on the right than the left. The effusions are estimated to be a moderate in size on the right and small on the left.  Left atrial appendage ligation clip noted.  A right basilar opacity is noted and could reflect  atelectasis or airspace disease.  IMPRESSION: Layering bilateral pleural effusions, moderate on the right and small on the left. A right basilar opacity could reflect atelectasis or airspace consolidation.   Original Report Authenticated By: Britta Mccreedy, M.D.    Lab Results  Component Value Date   WBC 9.4 03/23/2013   HGB 11.2* 03/23/2013   HCT 34.6* 03/23/2013   MCV 90.4 03/23/2013   PLT 356.0 03/23/2013    Lab Results  Component Value Date   CREATININE 1.3*  03/23/2013   BUN 24* 03/23/2013   NA 137 03/23/2013   K 4.0 03/23/2013   CL 102 03/23/2013   CO2 24 03/23/2013    Lab Results  Component Value Date   ALT 32 01/20/2013   AST 26 01/20/2013   ALKPHOS 55 01/20/2013   BILITOT 0.9 01/20/2013    Lab Results  Component Value Date   TSH 0.82 01/13/2009    BNP    Component Value Date/Time   PROBNP 110.2* 01/24/2010 0000      Assessment/Plan:  Coralyn Helling, MD [place] Pulmonary/Critical Care/Sleep Pager:  [phone]

## 2013-04-16 NOTE — Patient Instructions (Signed)
Will arrange for interventional radiologist to do right chest thoracentesis Follow up in 2 weeks

## 2013-04-16 NOTE — Progress Notes (Deleted)
  Subjective:    Patient ID: Kayla Harrison, female    DOB: [birth year], 67 y.o.   MRN: [id number]  HPI    Review of Systems  Constitutional: Negative for fever, chills, diaphoresis, activity change, appetite change, fatigue and unexpected weight change.  HENT: Negative for hearing loss, ear pain, nosebleeds, congestion, sore throat, facial swelling, rhinorrhea, sneezing, mouth sores, trouble swallowing, neck pain, neck stiffness, dental problem, voice change, postnasal drip, sinus pressure, tinnitus and ear discharge.   Eyes: Negative for photophobia, discharge, redness, itching and visual disturbance.  Respiratory: Positive for cough and shortness of breath. Negative for apnea, choking, chest tightness, wheezing and stridor.   Cardiovascular: Positive for chest pain. Negative for palpitations and leg swelling.  Gastrointestinal: Positive for abdominal pain. Negative for nausea, vomiting, constipation, blood in stool and abdominal distention.  Genitourinary: Negative for dysuria, urgency, frequency, hematuria, flank pain, decreased urine volume and difficulty urinating.  Musculoskeletal: Negative for myalgias, back pain, joint swelling, arthralgias and gait problem.  Skin: Negative for color change, pallor and rash.  Neurological: Negative for dizziness, tremors, seizures, syncope, speech difficulty, weakness, light-headedness, numbness and headaches.  Hematological: Negative for adenopathy. Does not bruise/bleed easily.  Psychiatric/Behavioral: Negative for confusion, sleep disturbance, dysphoric mood and agitation. The patient is not nervous/anxious.        Objective:   Physical Exam        Assessment & Plan:

## 2013-04-19 ENCOUNTER — Inpatient Hospital Stay (HOSPITAL_COMMUNITY)
Admission: AD | Admit: 2013-04-19 | Discharge: 2013-04-23 | DRG: 187 | Disposition: A | Discharge status: Home / Self Care | Payer: Medicare Other | Source: Ambulatory Visit | Attending: Pulmonary Disease | Admitting: Pulmonary Disease

## 2013-04-19 ENCOUNTER — Encounter: Payer: Self-pay | Admitting: Adult Health

## 2013-04-19 ENCOUNTER — Encounter (HOSPITAL_COMMUNITY): Payer: Self-pay | Admitting: *Deleted

## 2013-04-19 ENCOUNTER — Ambulatory Visit (INDEPENDENT_AMBULATORY_CARE_PROVIDER_SITE_OTHER)
Admission: RE | Admit: 2013-04-19 | Discharge: 2013-04-19 | Disposition: A | Discharge status: Home / Self Care | Payer: Medicare Other | Source: Ambulatory Visit | Attending: Adult Health | Admitting: Adult Health

## 2013-04-19 ENCOUNTER — Ambulatory Visit (HOSPITAL_COMMUNITY): Payer: Medicare Other

## 2013-04-19 ENCOUNTER — Ambulatory Visit (INDEPENDENT_AMBULATORY_CARE_PROVIDER_SITE_OTHER): Payer: Medicare Other | Admitting: Adult Health

## 2013-04-19 ENCOUNTER — Telehealth: Payer: Self-pay | Admitting: Pulmonary Disease

## 2013-04-19 VITALS — BP 136/82 | HR 88 | Temp 99.0°F | Ht 65.5 in | Wt 222.0 lb

## 2013-04-19 DIAGNOSIS — I4891 Unspecified atrial fibrillation: Secondary | ICD-10-CM | POA: Diagnosis not present

## 2013-04-19 DIAGNOSIS — J90 Pleural effusion, not elsewhere classified: Secondary | ICD-10-CM | POA: Diagnosis not present

## 2013-04-19 DIAGNOSIS — R55 Syncope and collapse: Secondary | ICD-10-CM

## 2013-04-19 DIAGNOSIS — R0602 Shortness of breath: Secondary | ICD-10-CM | POA: Diagnosis not present

## 2013-04-19 DIAGNOSIS — Z96659 Presence of unspecified artificial knee joint: Secondary | ICD-10-CM

## 2013-04-19 DIAGNOSIS — Z6835 Body mass index (BMI) 35.0-35.9, adult: Secondary | ICD-10-CM

## 2013-04-19 DIAGNOSIS — Z860100 Personal history of colon polyps, unspecified: Secondary | ICD-10-CM

## 2013-04-19 DIAGNOSIS — D126 Benign neoplasm of colon, unspecified: Secondary | ICD-10-CM

## 2013-04-19 DIAGNOSIS — Z950 Presence of cardiac pacemaker: Secondary | ICD-10-CM | POA: Diagnosis not present

## 2013-04-19 DIAGNOSIS — Z7901 Long term (current) use of anticoagulants: Secondary | ICD-10-CM

## 2013-04-19 DIAGNOSIS — R091 Pleurisy: Secondary | ICD-10-CM | POA: Diagnosis not present

## 2013-04-19 DIAGNOSIS — R319 Hematuria, unspecified: Secondary | ICD-10-CM

## 2013-04-19 DIAGNOSIS — R7309 Other abnormal glucose: Secondary | ICD-10-CM

## 2013-04-19 DIAGNOSIS — R059 Cough, unspecified: Secondary | ICD-10-CM | POA: Diagnosis not present

## 2013-04-19 DIAGNOSIS — M1710 Unilateral primary osteoarthritis, unspecified knee: Secondary | ICD-10-CM

## 2013-04-19 DIAGNOSIS — Z853 Personal history of malignant neoplasm of breast: Secondary | ICD-10-CM

## 2013-04-19 DIAGNOSIS — Z85850 Personal history of malignant neoplasm of thyroid: Secondary | ICD-10-CM

## 2013-04-19 DIAGNOSIS — E039 Hypothyroidism, unspecified: Secondary | ICD-10-CM | POA: Diagnosis not present

## 2013-04-19 DIAGNOSIS — R7402 Elevation of levels of lactic acid dehydrogenase (LDH): Secondary | ICD-10-CM

## 2013-04-19 DIAGNOSIS — E782 Mixed hyperlipidemia: Secondary | ICD-10-CM

## 2013-04-19 DIAGNOSIS — I495 Sick sinus syndrome: Secondary | ICD-10-CM | POA: Diagnosis not present

## 2013-04-19 DIAGNOSIS — L723 Sebaceous cyst: Secondary | ICD-10-CM

## 2013-04-19 DIAGNOSIS — IMO0002 Reserved for concepts with insufficient information to code with codable children: Secondary | ICD-10-CM

## 2013-04-19 DIAGNOSIS — I6350 Cerebral infarction due to unspecified occlusion or stenosis of unspecified cerebral artery: Secondary | ICD-10-CM

## 2013-04-19 DIAGNOSIS — I5032 Chronic diastolic (congestive) heart failure: Secondary | ICD-10-CM

## 2013-04-19 DIAGNOSIS — J9819 Other pulmonary collapse: Secondary | ICD-10-CM | POA: Diagnosis not present

## 2013-04-19 LAB — URINALYSIS, ROUTINE W REFLEX MICROSCOPIC
Bilirubin Urine: NEGATIVE
Glucose, UA: NEGATIVE mg/dL
Specific Gravity, Urine: 1.022 (ref 1.005–1.030)
Urobilinogen, UA: 0.2 mg/dL (ref 0.0–1.0)

## 2013-04-19 LAB — CBC WITH DIFFERENTIAL/PLATELET
Basophils Absolute: 0.1 10*3/uL (ref 0.0–0.1)
Basophils Relative: 0 % (ref 0–1)
Eosinophils Relative: 1 % (ref 0–5)
MCH: 28.2 pg (ref 26.0–34.0)
Monocytes Absolute: 1.5 10*3/uL — ABNORMAL HIGH (ref 0.1–1.0)
Monocytes Relative: 10 % (ref 3–12)
Neutro Abs: 12.8 10*3/uL — ABNORMAL HIGH (ref 1.7–7.7)
Neutrophils Relative %: 81 % — ABNORMAL HIGH (ref 43–77)
Platelets: 304 10*3/uL (ref 150–400)
RBC: 4.29 MIL/uL (ref 3.87–5.11)
WBC: 15.9 10*3/uL — ABNORMAL HIGH (ref 4.0–10.5)

## 2013-04-19 LAB — COMPREHENSIVE METABOLIC PANEL
ALT: 9 U/L (ref 0–35)
AST: 12 U/L (ref 0–37)
Albumin: 3.5 g/dL (ref 3.5–5.2)
Alkaline Phosphatase: 98 U/L (ref 39–117)
Calcium: 9.8 mg/dL (ref 8.4–10.5)
GFR calc Af Amer: 52 mL/min — ABNORMAL LOW (ref >90)
Potassium: 4 mEq/L (ref 3.5–5.1)
Sodium: 138 mEq/L (ref 135–145)
Total Protein: 7.5 g/dL (ref 6.0–8.3)

## 2013-04-19 LAB — PROTIME-INR
INR: 1.41 (ref 0.00–1.49)
Prothrombin Time: 16.9 seconds — ABNORMAL HIGH (ref 11.6–15.2)

## 2013-04-19 LAB — TSH: TSH: 9.172 u[IU]/mL — ABNORMAL HIGH (ref 0.350–4.500)

## 2013-04-19 LAB — URINE MICROSCOPIC-ADD ON

## 2013-04-19 MED ORDER — SODIUM CHLORIDE 0.9 % IJ SOLN: 3.0000 mL | Freq: Two times a day (BID) | INTRAMUSCULAR | Status: DC

## 2013-04-19 MED ORDER — SENNOSIDES-DOCUSATE SODIUM 8.6-50 MG PO TABS
1.0000 | ORAL_TABLET | Freq: Every evening | ORAL | Status: DC | PRN
  Administered 2013-04-20 – 2013-04-22 (×2): 1 via ORAL
  Filled 2013-04-19 (×2): qty 1

## 2013-04-19 MED ORDER — HYDROCODONE-ACETAMINOPHEN 5-325 MG PO TABS
1.0000 | ORAL_TABLET | ORAL | Status: DC | PRN
  Administered 2013-04-19 – 2013-04-20 (×2): 1 via ORAL
  Administered 2013-04-20 (×2): 2 via ORAL
  Administered 2013-04-21 (×2): 1 via ORAL
  Administered 2013-04-22 (×2): 2 via ORAL
  Administered 2013-04-22: 1 via ORAL
  Filled 2013-04-19 (×4): qty 2
  Filled 2013-04-19 (×4): qty 1
  Filled 2013-04-19: qty 2
  Filled 2013-04-19: qty 1
  Filled 2013-04-19: qty 2
  Filled 2013-04-19: qty 1

## 2013-04-19 MED ORDER — SODIUM CHLORIDE 0.9 % IJ SOLN
3.0000 mL | Freq: Two times a day (BID) | INTRAMUSCULAR | Status: DC
  Administered 2013-04-19 – 2013-04-22 (×2): 3 mL via INTRAVENOUS

## 2013-04-19 MED ORDER — ONDANSETRON HCL 4 MG PO TABS: 4.0000 mg | ORAL_TABLET | Freq: Four times a day (QID) | ORAL | Status: DC | PRN

## 2013-04-19 MED ORDER — SODIUM CHLORIDE 0.9 % IJ SOLN
3.0000 mL | INTRAMUSCULAR | Status: DC | PRN
  Administered 2013-04-21 – 2013-04-22 (×2): 3 mL via INTRAVENOUS

## 2013-04-19 MED ORDER — DILTIAZEM HCL ER COATED BEADS 120 MG PO CP24
120.0000 mg | ORAL_CAPSULE | Freq: Two times a day (BID) | ORAL | Status: DC
  Administered 2013-04-19 – 2013-04-23 (×8): 120 mg via ORAL
  Filled 2013-04-19 (×11): qty 1

## 2013-04-19 MED ORDER — SODIUM CHLORIDE 0.9 % IV SOLN
250.0000 mL | INTRAVENOUS | Status: DC | PRN
Start: 2013-04-19 — End: 2013-04-23

## 2013-04-19 MED ORDER — SIMVASTATIN 20 MG PO TABS: 20.0000 mg | ORAL_TABLET | Freq: Every day | ORAL | Status: DC

## 2013-04-19 MED ORDER — LEVOTHYROXINE SODIUM 150 MCG PO TABS
150.0000 ug | ORAL_TABLET | Freq: Every day | ORAL | Status: DC
  Administered 2013-04-20 – 2013-04-21 (×2): 150 ug via ORAL
  Filled 2013-04-19 (×3): qty 1

## 2013-04-19 MED ORDER — DEXTROSE 5 % IV SOLN
1.0000 g | Freq: Three times a day (TID) | INTRAVENOUS | Status: DC
  Administered 2013-04-19 – 2013-04-23 (×10): 1 g via INTRAVENOUS
  Filled 2013-04-19 (×13): qty 1

## 2013-04-19 MED ORDER — ACETAMINOPHEN 650 MG RE SUPP: 650.0000 mg | Freq: Four times a day (QID) | RECTAL | Status: DC | PRN

## 2013-04-19 MED ORDER — AMIODARONE HCL 200 MG PO TABS
400.0000 mg | ORAL_TABLET | Freq: Every day | ORAL | Status: DC
  Administered 2013-04-20 – 2013-04-23 (×4): 400 mg via ORAL
  Filled 2013-04-19 (×5): qty 2

## 2013-04-19 MED ORDER — ACETAMINOPHEN 325 MG PO TABS: 650.0000 mg | ORAL_TABLET | Freq: Four times a day (QID) | ORAL | Status: DC | PRN

## 2013-04-19 MED ORDER — HYDROCODONE-HOMATROPINE 5-1.5 MG/5ML PO SYRP
5.0000 mL | ORAL_SOLUTION | Freq: Four times a day (QID) | ORAL | Status: DC | PRN
  Administered 2013-04-19 – 2013-04-20 (×5): 5 mL via ORAL
  Filled 2013-04-19 (×5): qty 5

## 2013-04-19 MED ORDER — ONDANSETRON HCL 4 MG/2ML IJ SOLN
4.0000 mg | Freq: Four times a day (QID) | INTRAMUSCULAR | Status: DC | PRN
  Administered 2013-04-20: 4 mg via INTRAVENOUS
  Filled 2013-04-19: qty 2

## 2013-04-19 MED ORDER — PRAVASTATIN SODIUM 40 MG PO TABS
40.0000 mg | ORAL_TABLET | Freq: Every day | ORAL | Status: DC
  Administered 2013-04-19 – 2013-04-22 (×4): 40 mg via ORAL
  Filled 2013-04-19 (×6): qty 1

## 2013-04-19 MED ORDER — VANCOMYCIN HCL IN DEXTROSE 750-5 MG/150ML-% IV SOLN
750.0000 mg | Freq: Two times a day (BID) | INTRAVENOUS | Status: DC
  Administered 2013-04-19 – 2013-04-20 (×2): 750 mg via INTRAVENOUS
  Filled 2013-04-19 (×2): qty 150

## 2013-04-19 NOTE — H&P (Addendum)
PULMONARY  / CRITICAL CARE MEDICINE  Name: Kayla Harrison MRN: [id number] DOB: 01-Sep-1946    ADMISSION DATE:  04/19/2013   PRIMARY SERVICE: PCCM   CHIEF COMPLAINT:  Increased shortness of breath   BRIEF PATIENT DESCRIPTION:  67 yo WF with  chronic afib on Eiquis s/p ablation at [facility] 6/10  W/ post op complication of right sided pleural effusion , sick sinus syndrome requiring pacemaker admitted from office 7/28 for large right sided pleural effusion .   SIGNIFICANT EVENTS / STUDIES:    LINES / TUBES:   CULTURES:   ANTIBIOTICS: Maxipime 7/28 >> Vanc 7/28 >>  HISTORY OF PRESENT ILLNESS:   History of Present Illness  66 yowf  with Chronic  AF s/p recent convergent ablation procedure performed at [facility] 03/02/2013. Her convergent ablation procedure was complicated by recurrent rapid AF, right-sided pleural effusion, anemia, recurrent rapid AF and bradycardia with sick sinus syndrome requiring PPM implantation. She was started on Tikosyn -unable to tolerate d/t prolonged QT . Then restarted on Amiodarone. Notes from [facility] at discharge indicated she had a moderate right sided pleural effusion prior to discharge. Since dishcarge has had difficulty with cough, dyspnea. Was seen by PCP found to have persistent right sided moderate effusion . Referred to pulmonary on 04/16/13 . Pt was set up for thoracentesis on 7/31 with Eliquis to be held 48hr prior to procedure. Over last 2 days symptoms are worse with more pleuritic pain on right, orthopnea and cough. Has low grade fever in office.  Pt will require admission for further IP treatment and evaluation.    PAST MEDICAL HISTORY :  Past Medical History  Diagnosis Date  . Morbid obesity     Status post lap band surgery  . Hx of thyroid cancer     Dr Evlyn Kanner  . Colon polyp     Dr Kinnie Scales  . Dyslipidemia   . Hyperlipidemia   . Hypothyroidism   . Breast cancer     Dr Jamey Ripa, total thyroidectomy- 1999- for cancer  . Pneumonia     2010-  hosp. here [facility]- during that event had to be cardioverted   . Arthritis     osteoarthritis - knees   . Normal cardiac stress test     2008- cardiac stress/echo  . Stroke     2003- [place]   . Atrial fibrillation     ablation- 2x's-- 1st time- [facility], 2nd event at [facility] in 2008. Dr. Graciela Husbands- [place]- sees pt. once /yr. ,  . Complication of anesthesia     Ketamine produces LSD reaction, bright colored nightmarish experience   . Hepatitis     [place] ?hepatitis    Past Surgical History  Procedure Laterality Date  . Thyroidectomy  1998    Dr Jamey Ripa  . Bso  1998  . Cholecystectomy    . Tonsillectomy    . Colonoscopy w/ polypectomy      Dr Kinnie Scales  . Afib ablation      X 2; [facility] & Dr Graciela Husbands  . Abdominal hysterectomy  1983  . Breast lumpectomy  2010  . Laparoscopic gastric banding  07/10/2010  . Appendectomy    . Knee arthroscopy      bilateral  . Laparotomy      for ruptured ovary and ovarian artery   . Total knee arthroplasty  04/13/2012    Procedure: TOTAL KNEE ARTHROPLASTY;  Surgeon: Raymon Mutton, MD;  Location: [facility] OR;  Service: Orthopedics;  Laterality: Right;  . Joint replacement  04/13/12    Right Knee replacement  . Cardioversion  10/09/2012    Procedure: CARDIOVERSION;  Surgeon: Rollene Rotunda, MD;  Location: [facility] OR;  Service: Cardiovascular;  Laterality: N/A;  . Cardioversion  10/09/2012    Procedure: CARDIOVERSION;  Surgeon: Rollene Rotunda, MD;  Location: [facility] ENDOSCOPY;  Service: Cardiovascular;  Laterality: N/A;  Kathlene November gave the ok to add pt to the add on , but we must check to find out if the can add pt on at [phone] ( [phone])  . Cardioversion N/A 11/20/2012    Procedure: CARDIOVERSION;  Surgeon: Pricilla Riffle, MD;  Location: [facility] ENDOSCOPY;  Service: Cardiovascular;  Laterality: N/A;  . Transabdominal ablation  03/02/13    [facility]  . Pacemaker insertion  03/10/13    [facility]   Prior to Admission medications   Medication Sig Start Date End  Date Taking? Authorizing Provider  acetaminophen-codeine (TYLENOL #3) 300-30 MG per tablet Take 1 tablet by mouth every 4 (four) hours as needed for pain.    Historical Provider, MD  amiodarone (PACERONE) 400 MG tablet Take 400 mg by mouth daily.    Historical Provider, MD  apixaban (ELIQUIS) 5 MG TABS tablet Take 5 mg by mouth 2 (two) times daily.     Historical Provider, MD  Cholecalciferol (VITAMIN D3) 1000 UNITS CAPS Take 1 capsule by mouth daily.     Historical Provider, MD  diltiazem (CARDIZEM CD) 120 MG 24 hr capsule Take 1 capsule (120 mg total) by mouth 2 (two) times daily. 11/20/12   Duke Salvia, MD  HYDROcodone-homatropine (HYDROMET) 5-1.5 MG/5ML syrup Take 5 mLs by mouth every 6 (six) hours as needed for cough. 04/15/13   Pecola Lawless, MD  levothyroxine (SYNTHROID, LEVOTHROID) 150 MCG tablet Take 150 mcg by mouth daily before breakfast.    Historical Provider, MD  pravastatin (PRAVACHOL) 40 MG tablet Take 1 tablet (40 mg total) by mouth daily. 03/01/13   Pecola Lawless, MD   Allergies  Allergen Reactions  . Tikosyn (Dofetilide)     Prolonged QT interval  . Xarelto (Rivaroxaban)     Nose Bleed X 6 hrs ; packing in ER  . Epinephrine Other (See Comments) and Rash    Dental form only (liquid). Patient stated she will become "out of it" she can hear you but cannot respond in normal fashion.  Marland Kitchen Ketamine     Hallucinations    FAMILY HISTORY:  Family History  Problem Relation Age of Onset  . Cancer Father     COLON  . Heart disease Father 57    MI @autopsy   . Diabetes Sister   . Diabetes Brother   . Diabetes Paternal Aunt   . Diabetes Paternal Grandmother    SOCIAL HISTORY:  reports that she has never smoked. She has never used smokeless tobacco. She reports that she does not drink alcohol or use illicit drugs.  REVIEW OF SYSTEMS:   Review of Systems  Constitutional: No weight loss, night sweats, Fevers, chills,  +fatigue, or lassitude.  HEENT: No headaches,  Difficulty swallowing, Tooth/dental problems, or Sore throat,  No sneezing, itching, ear ache, nasal congestion, post nasal drip,  CV: + Orthopnea,  No PND,  +swelling in lower extremities,  NO anasarca, dizziness, palpitations, syncope.  GI No heartburn, indigestion, abdominal pain, nausea, vomiting, diarrhea, change in bowel habits, loss of appetite, bloody stools.  Resp: No shortness of breath with exertion or at rest. No  excess mucus, no productive cough, No non-productive cough, No coughing up of blood. No change in color of mucus. No wheezing. No chest wall deformity  Skin: no rash or lesions.  GU: no dysuria, change in color of urine, no urgency or frequency. No flank pain, no hematuria  MS: No joint pain or swelling. No decreased range of motion. No back pain.  Psych: No change in mood or affect. No depression or anxiety. No memory loss.     SUBJECTIVE:  Cough and Dyspnea are getting worse   VITAL SIGNS: Temp:  [99 F (37.2 C)] 99 F (37.2 C) (07/28 1458) Pulse Rate:  [88] 88 (07/28 1458) BP: (136)/(82) 136/82 mmHg (07/28 1458) SpO2:  [92 %] 92 % (07/28 1458) Weight:  [222 lb (100.699 kg)] 222 lb (100.699 kg) (07/28 1458) FIO2  RA  PHYSICAL EXAMINATION:  GEN: A/Ox3; pleasant , NAD,obese  HEENT: [place]/AT, EACs-clear, TMs-wnl, NOSE-clear, THROAT-clear, no lesions, no postnasal drip or exudate noted.  NECK: Supple w/ fair ROM; no JVD; normal carotid impulses w/o bruits; no thyromegaly or nodules palpated; no lymphadenopathy.  RESP Diminshed BS in bases R > L . no accessory muscle use, no dullness to percussion  CARD: RRR, no m/r/g , tr peripheral edema with venous insufficiency changes , pulses intact, no cyanosis or clubbing.  GI: Soft & nt; nml bowel sounds; no organomegaly or masses detected.  Musco: Warm bil, no deformities or joint swelling noted.  Neuro: alert, no focal deficits noted.  Skin: Warm, no lesions or rashes      No results found for this basename: NA, K,  CL, CO2, BUN, CREATININE, GLUCOSE,  in the last 168 hours No results found for this basename: HGB, HCT, WBC, PLT,  in the last 168 hours Dg Chest 2 View  04/19/2013   *RADIOLOGY REPORT*  Clinical Data: Dyspnea, pleural effusion  CHEST - 2 VIEW  Comparison: An 04/09/2013  Findings: The heart size and vascular pattern are normal.  Cardiac pacer is in unchanged position.  The left lung is clear.  There is a moderate-to-large right pleural effusion which is mildly to moderately larger when compared to the prior study.  IMPRESSION:  Moderate to large right pleural effusion increased from the prior examination.   Original Report Authenticated By: Esperanza Heir, M.D.    ASSESSMENT / PLAN: 1. Worsening  right sided large pleural effusion s/p cardiac surgery (ablation) on 03/02/13 on Eliquis for Atrial Fib.  ? Parapneumonic process with low grade fever although recent cbc w/ nml wbc or post cardiac injury syndrome (Dressler's like - though note the echo 7/8 showed no pericardial effusion making this much less likely)  Will need thoracentesis but on Eliquis .    Plan  Admit to [facility]  Check labs with BNP/ESR /INR/PLT  Begin IV abx ? Parapneumonic process with low grade fever Hold Eliquis - will need thoracentesis (SCD )  O2 for sat >90% Thoracentesis w/ fluid analysis when safe off anticoagulants  2. Hx of Chronic Atrial Fib with recent convergent ablation 03/02/13 on Eliquis/amiodarone/cardizem.  Chronic Diastolic Heart Failure  Sick Sinus syndrome s/p pacemaker 02/2013   Plan  Hold Eliquis  SCD  Cont amiodaone /Cardizem  EKG  Check bnp      3. Hypothyroid  Plan  Check tsh  Cont synthroid     Sandrea Hughs, MD Pulmonary and Critical Care Medicine [facility] Cell [phone] After 5:30 PM or weekends, call [phone]

## 2013-04-19 NOTE — Assessment & Plan Note (Signed)
Progressive Large Right sided Pleural Effusion   Plan  Admit to hospital  See admit orders/hPI

## 2013-04-19 NOTE — Progress Notes (Signed)
  Subjective:    Patient ID: Kayla Harrison, female    DOB: 01/21/1946, 67 y.o.   MRN: [id number]  HPI 04/19/2013 Acute OV  Complains of increased SOB, cough occasionally producing white frothy mucus, right lung pain - worse since last ov.  Thoracentesis scheduled for 7/31.  Seen for pulmonary consult 7/25 for persistent pleural effusion since 03/07/13 (s/p cardiac ablation for A Fib at [facility] on 6/10 ). Set up for thoracentesis on 7/31 but worse over weekend with more dyspnea, orthopnea and pleuritic pain.  CXR today shows  Increased large right effusion  Pt complains of orthopnea and increased cough.    Review of Systems Constitutional:   No  weight loss, night sweats,  Fevers, chills,  +fatigue, or  lassitude.  HEENT:   No headaches,  Difficulty swallowing,  Tooth/dental problems, or  Sore throat,                No sneezing, itching, ear ache, nasal congestion, post nasal drip,   CV:  + Orthopnea, No  PND,  +swelling in lower extremities, NO anasarca, dizziness, palpitations, syncope.   GI  No heartburn, indigestion, abdominal pain, nausea, vomiting, diarrhea, change in bowel habits, loss of appetite, bloody stools.   Resp: No shortness of breath with exertion or at rest.  No excess mucus, no productive cough,  No non-productive cough,  No coughing up of blood.  No change in color of mucus.  No wheezing.  No chest wall deformity  Skin: no rash or lesions.  GU: no dysuria, change in color of urine, no urgency or frequency.  No flank pain, no hematuria   MS:  No joint pain or swelling.  No decreased range of motion.  No back pain.  Psych:  No change in mood or affect. No depression or anxiety.  No memory loss.         Objective:   Physical Exam GEN: A/Ox3; pleasant , NAD,obese   HEENT:  [place]/AT,  EACs-clear, TMs-wnl, NOSE-clear, THROAT-clear, no lesions, no postnasal drip or exudate noted.   NECK:  Supple w/ fair ROM; no JVD; normal carotid impulses w/o bruits; no  thyromegaly or nodules palpated; no lymphadenopathy.  RESP  Diminshed BS in bases R > L .no accessory muscle use, no dullness to percussion  CARD:  RRR, no m/r/g  , tr peripheral edema with venous insufficiency changes , pulses intact, no cyanosis or clubbing.  GI:   Soft & nt; nml bowel sounds; no organomegaly or masses detected.  Musco: Warm bil, no deformities or joint swelling noted.   Neuro: alert, no focal deficits noted.    Skin: Warm, no lesions or rashes         Assessment & Plan:

## 2013-04-19 NOTE — Telephone Encounter (Signed)
Pt c/o increased productive cough with white "frotty" mucus and sob. Pt is wanting to know if she should go to ED or come here for a visit. Dr Craige Cotta out of office this afternoon---pt requesting be seen or she is going to ED.   Pt to see TP at 245.

## 2013-04-19 NOTE — Progress Notes (Signed)
ANTIBIOTIC CONSULT NOTE - INITIAL  Pharmacy Consult for Vancomycin/Cefepime Indication: suspected pneumonia  Allergies  Allergen Reactions  . Tikosyn (Dofetilide)     Prolonged QT interval  . Xarelto (Rivaroxaban)     Nose Bleed X 6 hrs ; packing in ER  . Epinephrine Other (See Comments) and Rash    Dental form only (liquid). Patient stated she will become "out of it" she can hear you but cannot respond in normal fashion.  Marland Kitchen Ketamine     Hallucinations    Patient Measurements: Height: 5' 5.35" (166 cm) (Documented in chart7/28/14) Weight: 222 lb 0.1 oz (100.7 kg) (Documented in Chart 04/19/13) IBW/kg (Calculated) : 57.81 Adjusted Body Weight:   Vital Signs: Temp: 99.4 F (37.4 C) (07/28 1824) Temp src: Oral (07/28 1824) BP: 127/70 mmHg (07/28 1824) Pulse Rate: 86 (07/28 1824) Intake/Output from previous day:   Intake/Output from this shift:    Labs: No results found for this basename: WBC, HGB, PLT, LABCREA, CREATININE,  in the last 72 hours Estimated Creatinine Clearance: 49 ml/min (by C-G formula based on Cr of 1.3). No results found for this basename: VANCOTROUGH, VANCOPEAK, VANCORANDOM, GENTTROUGH, GENTPEAK, GENTRANDOM, TOBRATROUGH, TOBRAPEAK, TOBRARND, AMIKACINPEAK, AMIKACINTROU, AMIKACIN,  in the last 72 hours   Microbiology: No results found for this or any previous visit (from the past 720 hour(s)).  Medical History: Past Medical History  Diagnosis Date  . Morbid obesity     Status post lap band surgery  . Hx of thyroid cancer     Dr Evlyn Kanner  . Colon polyp     Dr Kinnie Scales  . Dyslipidemia   . Hyperlipidemia   . Hypothyroidism   . Breast cancer     Dr Jamey Ripa, total thyroidectomy- 1999- for cancer  . Pneumonia     2010- hosp. here [facility]- during that event had to be cardioverted   . Arthritis     osteoarthritis - knees   . Normal cardiac stress test     2008- cardiac stress/echo  . Stroke     2003- [place]   . Atrial fibrillation     ablation- 2x's-- 1st  time- [facility], 2nd event at [facility] in 2008. Dr. Graciela Husbands- [place]- sees pt. once /yr. ,  . Complication of anesthesia     Ketamine produces LSD reaction, bright colored nightmarish experience   . Hepatitis     [place] ?hepatitis     Medications:   Assessment: 67 yo F being evaluated for pleural effusion. Increased shortness of breath, productive cough,dyspnea, . Patient had ablation at [facility] in June of this year.  Goal of Therapy:  Vancomycin trough level 15-20 mcg/ml  Plan:  Begin Cefepime 1 Gm IV q 8 hours. Begin Vancomycin 750mg  IV q 12 hours. Measure antibiotic drug levels at steady state Follow up culture results  Loletta Specter 04/19/2013,6:45 PM

## 2013-04-20 ENCOUNTER — Inpatient Hospital Stay (HOSPITAL_COMMUNITY): Payer: Medicare Other

## 2013-04-20 DIAGNOSIS — R091 Pleurisy: Secondary | ICD-10-CM | POA: Diagnosis not present

## 2013-04-20 DIAGNOSIS — J90 Pleural effusion, not elsewhere classified: Secondary | ICD-10-CM | POA: Diagnosis not present

## 2013-04-20 DIAGNOSIS — J9819 Other pulmonary collapse: Secondary | ICD-10-CM | POA: Diagnosis not present

## 2013-04-20 LAB — BODY FLUID CELL COUNT WITH DIFFERENTIAL
Monocyte-Macrophage-Serous Fluid: 8 % — ABNORMAL LOW (ref 50–90)
Total Nucleated Cell Count, Fluid: 2565 cu mm — ABNORMAL HIGH (ref 0–1000)

## 2013-04-20 LAB — CBC
HCT: 37.5 % (ref 36.0–46.0)
Hemoglobin: 11.8 g/dL — ABNORMAL LOW (ref 12.0–15.0)
MCH: 28.3 pg (ref 26.0–34.0)
MCHC: 31.5 g/dL (ref 30.0–36.0)
MCV: 89.9 fL (ref 78.0–100.0)
RBC: 4.17 MIL/uL (ref 3.87–5.11)

## 2013-04-20 LAB — BASIC METABOLIC PANEL
BUN: 20 mg/dL (ref 6–23)
CO2: 27 mEq/L (ref 19–32)
Chloride: 99 mEq/L (ref 96–112)
GFR calc non Af Amer: 57 mL/min — ABNORMAL LOW (ref >90)
Glucose, Bld: 115 mg/dL — ABNORMAL HIGH (ref 70–99)
Potassium: 4.4 mEq/L (ref 3.5–5.1)
Sodium: 136 mEq/L (ref 135–145)

## 2013-04-20 LAB — ALBUMIN, FLUID (OTHER): Albumin, Fluid: 2.6 g/dL

## 2013-04-20 LAB — PROTEIN, BODY FLUID: Total protein, fluid: 4.2 g/dL

## 2013-04-20 LAB — GLUCOSE, SEROUS FLUID: Glucose, Fluid: 129 mg/dL

## 2013-04-20 MED ORDER — DOCUSATE SODIUM 100 MG PO CAPS
100.0000 mg | ORAL_CAPSULE | Freq: Every day | ORAL | Status: DC
  Administered 2013-04-20 – 2013-04-23 (×4): 100 mg via ORAL
  Filled 2013-04-20 (×4): qty 1

## 2013-04-20 MED ORDER — ENSURE PUDDING PO PUDG
1.0000 | ORAL | Status: DC
  Administered 2013-04-20: 1 via ORAL
  Filled 2013-04-20 (×5): qty 1

## 2013-04-20 MED ORDER — VANCOMYCIN HCL IN DEXTROSE 1-5 GM/200ML-% IV SOLN
1000.0000 mg | Freq: Two times a day (BID) | INTRAVENOUS | Status: DC
  Administered 2013-04-20 – 2013-04-22 (×4): 1000 mg via INTRAVENOUS
  Filled 2013-04-20 (×4): qty 200

## 2013-04-20 MED ORDER — ADULT MULTIVITAMIN W/MINERALS CH
1.0000 | ORAL_TABLET | Freq: Every day | ORAL | Status: DC
  Administered 2013-04-20 – 2013-04-23 (×4): 1 via ORAL
  Filled 2013-04-20 (×4): qty 1

## 2013-04-20 NOTE — Progress Notes (Signed)
INITIAL NUTRITION ASSESSMENT  DOCUMENTATION CODES Per approved criteria  -Obesity Unspecified   INTERVENTION: Provide Ensure Pudding once daily Encourage PO intake Provide Multivitamin with minerals daily  NUTRITION DIAGNOSIS: Inadequate oral intake related to poor appetite as evidenced by pt's report of eating 50% less than usual for past 7 weeks.   Goal: Pt to meet >/= 90% of their estimated nutrition needs   Monitor:  PO intake Weight Labs  Reason for Assessment: Malnutrition Screening Tool, score of 3  67 y.o. female  Admitting Dx: Pleural Effusion  ASSESSMENT: 67 yo WF with chronic afib on Eiquis s/p ablation at [facility] 6/10 W/ post op complication of right sided pleural effusion , sick sinus syndrome requiring pacemaker admitted from office 7/28 for large right sided pleural effusion . Pt reports being at [facility] about 7 weeks ago where she lost 12 lbs of fluid; she went home on lasix and reports she has lost another 15 lbs of fluid in the past 7 weeks. Pt also reports having a poor appetite during this time and eating about 50% of her usual PO intake. Pt states she had Lap-band surgery in 2011 before which she weighed 283 lbs. Pt reports she was 232 lbs before going to [facility]. Pt states she is eating <50% of her meals at this time. Encouraged Protein-rich foods and PO intake >50% of meals to maintain nutrition status. Pt is not interested in any protein supplements except for Ensure Pudding.  Height: Ht Readings from Last 1 Encounters:  04/19/13 5' 5.35" (1.66 m)    Weight: Wt Readings from Last 1 Encounters:  04/20/13 220 lb 0.3 oz (99.8 kg)    Ideal Body Weight: 127 lbs  % Ideal Body Weight: 173%  Wt Readings from Last 10 Encounters:  04/20/13 220 lb 0.3 oz (99.8 kg)  04/19/13 222 lb (100.699 kg)  04/16/13 220 lb (99.791 kg)  04/07/13 218 lb (98.884 kg)  03/23/13 216 lb 6.4 oz (98.158 kg)  02/25/13 236 lb (107.049 kg)  02/05/13 232 lb (105.235  kg)  02/04/13 234 lb (106.142 kg)  01/25/13 237 lb (107.502 kg)  01/25/13 237 lb (107.502 kg)    Usual Body Weight: 232 lbs  % Usual Body Weight: 95%  BMI:  Body mass index is 36.22 kg/(m^2).  Estimated Nutritional Needs: Kcal: 1600-1750 Protein: 110-120 grams Fluid: 2.7 L  Skin: WDL  Diet Order: Cardiac  EDUCATION NEEDS: -No education needs identified at this time   Intake/Output Summary (Last 24 hours) at 04/20/13 1445 Last data filed at 04/20/13 0940  Gross per 24 hour  Intake    470 ml  Output    750 ml  Net   -280 ml    Last BM: 7/29  Labs:   Recent Labs Lab 04/19/13 1905 04/20/13 0503  NA 138 136  K 4.0 4.4  CL 98 99  CO2 28 27  BUN 22 20  CREATININE 1.21* 1.00  CALCIUM 9.8 9.4  MG 2.1  --   PHOS 3.9  --   GLUCOSE 116* 115*    CBG (last 3)  No results found for this basename: GLUCAP,  in the last 72 hours  Scheduled Meds: . amiodarone  400 mg Oral Daily  . ceFEPime (MAXIPIME) IV  1 g Intravenous Q8H  . diltiazem  120 mg Oral BID  . docusate sodium  100 mg Oral Daily  . levothyroxine  150 mcg Oral QAC breakfast  . pravastatin  40 mg Oral q1800  .  sodium chloride  3 mL Intravenous Q12H  . sodium chloride  3 mL Intravenous Q12H  . vancomycin  1,000 mg Intravenous Q12H    Continuous Infusions:   Past Medical History  Diagnosis Date  . Morbid obesity     Status post lap band surgery  . Hx of thyroid cancer     Dr Evlyn Kanner  . Colon polyp     Dr Kinnie Scales  . Dyslipidemia   . Hyperlipidemia   . Hypothyroidism   . Breast cancer     Dr Jamey Ripa, total thyroidectomy- 1999- for cancer  . Pneumonia     2010- hosp. here [facility]- during that event had to be cardioverted   . Arthritis     osteoarthritis - knees   . Normal cardiac stress test     2008- cardiac stress/echo  . Stroke     2003- [place]   . Atrial fibrillation     ablation- 2x's-- 1st time- [facility], 2nd event at [facility] in 2008. Dr. Graciela Husbands- [place]- sees pt. once /yr. ,  .  Complication of anesthesia     Ketamine produces LSD reaction, bright colored nightmarish experience   . Hepatitis     [place], ?hepatitis     Past Surgical History  Procedure Laterality Date  . Thyroidectomy  1998    Dr Jamey Ripa  . Bso  1998  . Cholecystectomy    . Tonsillectomy    . Colonoscopy w/ polypectomy      Dr Kinnie Scales  . Afib ablation      X 2; [facility] & Dr Graciela Husbands  . Abdominal hysterectomy  1983  . Breast lumpectomy  2010  . Laparoscopic gastric banding  07/10/2010  . Appendectomy    . Knee arthroscopy      bilateral  . Laparotomy      for ruptured ovary and ovarian artery   . Total knee arthroplasty  04/13/2012    Procedure: TOTAL KNEE ARTHROPLASTY;  Surgeon: Raymon Mutton, MD;  Location: [facility] OR;  Service: Orthopedics;  Laterality: Right;  . Joint replacement  04/13/12    Right Knee replacement  . Cardioversion  10/09/2012    Procedure: CARDIOVERSION;  Surgeon: Rollene Rotunda, MD;  Location: [facility] OR;  Service: Cardiovascular;  Laterality: N/A;  . Cardioversion  10/09/2012    Procedure: CARDIOVERSION;  Surgeon: Rollene Rotunda, MD;  Location: [facility] ENDOSCOPY;  Service: Cardiovascular;  Laterality: N/A;  Kathlene November gave the ok to add pt to the add on , but we must check to find out if the can add pt on at 1400 ( [phone])  . Cardioversion N/A 11/20/2012    Procedure: CARDIOVERSION;  Surgeon: Pricilla Riffle, MD;  Location: [facility] ENDOSCOPY;  Service: Cardiovascular;  Laterality: N/A;  . Transabdominal ablation  03/02/13    [facility]  . Pacemaker insertion  03/10/13    [facility]    Ian Malkin RD, LDN Inpatient Clinical Dietitian Pager: [phone] After Hours Pager: [phone]

## 2013-04-20 NOTE — Progress Notes (Signed)
ANTIBIOTIC CONSULT NOTE - FOLLOW UP  Pharmacy Consult for Vancomycin/Cefepime Indication: rule out pneumonia  Allergies  Allergen Reactions  . Tikosyn (Dofetilide)     Prolonged QT interval  . Xarelto (Rivaroxaban)     Nose Bleed X 6 hrs ; packing in ER  . Epinephrine Other (See Comments) and Rash    Dental form only (liquid). Patient stated she will become "out of it" she can hear you but cannot respond in normal fashion.  Marland Kitchen Ketamine     Hallucinations    Patient Measurements: Height: 5' 5.35" (166 cm) (Documented in chart7/28/14) Weight: 220 lb 0.3 oz (99.8 kg) IBW/kg (Calculated) : 57.81   Vital Signs: Temp: 98.5 F (36.9 C) (07/29 0500) Temp src: Oral (07/29 0500) BP: 113/56 mmHg (07/29 1038) Pulse Rate: 74 (07/29 1038) Intake/Output from previous day: 07/28 0701 - 07/29 0700 In: 470 [P.O.:120; I.V.:100; IV Piggyback:250] Out: 450 [Urine:450] Intake/Output from this shift:    Labs:  Recent Labs  04/19/13 1905 04/20/13 0503  WBC 15.9* 14.2*  HGB 12.1 11.8*  PLT 304 294  CREATININE 1.21* 1.00   Estimated Creatinine Clearance: 63.4 ml/min (by C-G formula based on Cr of 1). No results found for this basename: VANCOTROUGH, VANCOPEAK, VANCORANDOM, GENTTROUGH, GENTPEAK, GENTRANDOM, TOBRATROUGH, TOBRAPEAK, TOBRARND, AMIKACINPEAK, AMIKACINTROU, AMIKACIN,  in the last 72 hours   Microbiology: No results found for this or any previous visit (from the past 720 hour(s)).  Anti-infectives   Start     Dose/Rate Route Frequency Ordered Stop   04/19/13 2200  ceFEPIme (MAXIPIME) 1 g in dextrose 5 % 50 mL IVPB     1 g 100 mL/hr over 30 Minutes Intravenous 3 times per day 04/19/13 2025     04/19/13 2100  vancomycin (VANCOCIN) IVPB 750 mg/150 ml premix     750 mg 150 mL/hr over 60 Minutes Intravenous Every 12 hours 04/19/13 2024        Assessment: 66 yoF on day #2 vancomycin 750 mg IV q12h and cefepime 1g IV q8h for suspected pneumonia.  Was scheduled for thoracentesis  on 7/31 for persistent right sided pleural effusion s/p cardiac ablation procedure 6/10; however, pt has had worsening symptoms of pleuritic pain, orthopnea, and cough and was admitted for evaluation and was initiated on empiric antibiotics for possible pneumonia.  SCr improved to 1.0, CrCl~ 60 ml/min/1.37m2 (normalized), ~83 ml/min (CG)  Tmax 99.0, WBC elevated at 14.2  Weight 99.8 kg  Today's CXR impression report: Large right pleural effusion and associated dense passive atelectasis and/or pneumonia in the right middle lobe and right lower lobe.  Goal of Therapy:  Vancomycin trough level 15-20 mcg/ml Eradication of infection, doses adjusted per renal clearance  Plan:  1.  Adjust vancomycin to 1g IV q12h due to improved SCr and obese dosing.  F/u trough level at steady state. 2.  Continue Cefepime 1g IV q8h.  Clance Boll 04/20/2013,10:51 AM

## 2013-04-20 NOTE — Procedures (Signed)
Successful US guided right thoracentesis. Yielded 1.5L of blood tinged fluid. Pt tolerated procedure well. No immediate complications.  Specimen was sent for labs. CXR ordered.  Brayton El PA-C 04/20/2013 3:41 PM

## 2013-04-20 NOTE — Progress Notes (Signed)
Called to the room as patient was having dry heaves. Zofran given to positive effect. Erskin Burnet RN

## 2013-04-20 NOTE — Progress Notes (Signed)
PULMONARY  / CRITICAL CARE MEDICINE  Name: Kayla Harrison MRN: [id number] DOB: 1946-05-26    ADMISSION DATE:  04/19/2013   PRIMARY SERVICE: PCCM   CHIEF COMPLAINT:  Increased shortness of breath   BRIEF PATIENT DESCRIPTION:   6 yowf  with Chronic  AF s/p recent convergent ablation procedure performed at [facility] 03/02/2013. Her convergent ablation procedure was complicated by recurrent rapid AF, right-sided pleural effusion, anemia, recurrent rapid AF and bradycardia with sick sinus syndrome requiring PPM implantation. She was started on Tikosyn -unable to tolerate d/t prolonged QT . Then restarted on Amiodarone. Notes from [facility] at discharge indicated she had a moderate right sided pleural effusion prior to discharge. Since dishcarge has had difficulty with cough, dyspnea. Was seen by PCP found to have persistent right sided moderate effusion . Referred to pulmonary on 04/16/13 . Pt was set up for thoracentesis on 7/31 with Eliquis to be held 48hr prior to procedure. Over last 2 days symptoms are worse with more pleuritic pain on right, orthopnea and cough. Has low grade fever in office.  Pt will require admission for further IP treatment and evaluation.    has a past medical history of Morbid obesity; thyroid cancer; Colon polyp; Dyslipidemia; Hyperlipidemia; Hypothyroidism; Breast cancer; Pneumonia; Arthritis; Normal cardiac stress test; Stroke; Atrial fibrillation; Complication of anesthesia; and Hepatitis.   has past surgical history that includes Thyroidectomy (1998); bso (1998); Cholecystectomy; Tonsillectomy; Colonoscopy w/ polypectomy; afib ablation; Abdominal hysterectomy (1983); Breast lumpectomy (2010); Laparoscopic gastric banding (07/10/2010); Appendectomy; Knee arthroscopy; laparotomy; Total knee arthroplasty (04/13/2012); Joint replacement (04/13/12); Cardioversion (10/09/2012); Cardioversion (10/09/2012); Cardioversion (N/A, 11/20/2012); Transabdominal ablation (03/02/13); and Pacemaker  insertion (03/10/13).   SIGNIFICANT EVENTS / STUDIES:    LINES / TUBES:   CULTURES: Results for orders placed during the hospital encounter of 09/10/12  URINE CULTURE     Status: None   Collection Time    09/10/12  7:45 AM      Result Value Range Status   Specimen Description URINE, CLEAN CATCH   Final   Special Requests NONE   Final   Culture  Setup Time 09/10/2012 14:04   Final   Colony Count NO GROWTH   Final   Culture NO GROWTH   Final   Report Status 09/11/2012 FINAL   Final     ANTIBIOTICS: Maxipime 7/28 >> Vanc 7/28 >> Anti-infectives   Start     Dose/Rate Route Frequency Ordered Stop   04/20/13 2100  vancomycin (VANCOCIN) IVPB 1000 mg/200 mL premix     1,000 mg 200 mL/hr over 60 Minutes Intravenous Every 12 hours 04/20/13 1058     04/19/13 2200  ceFEPIme (MAXIPIME) 1 g in dextrose 5 % 50 mL IVPB     1 g 100 mL/hr over 30 Minutes Intravenous 3 times per day 04/19/13 2025     04/19/13 2100  vancomycin (VANCOCIN) IVPB 750 mg/150 ml premix  Status:  Discontinued     750 mg 150 mL/hr over 60 Minutes Intravenous Every 12 hours 04/19/13 2024 04/20/13 1058      SUBJECTIVE/OVERNIGHT/INTERVAL HX - waiting for thora. LAst dose eliquis 04/19/13 AM. Having cougn and dry heaves   VITAL SIGNS: Temp:  [98.4 F (36.9 C)-99.4 F (37.4 C)] 98.5 F (36.9 C) (07/29 0500) Pulse Rate:  [74-88] 74 (07/29 1038) Resp:  [16-19] 19 (07/28 2037) BP: (113-136)/(56-82) 113/56 mmHg (07/29 1038) SpO2:  [92 %-96 %] 92 % (07/29 0500) Weight:  [99.8 kg (220 lb 0.3 oz)-100.7 kg (222 lb 0.1  oz)] 99.8 kg (220 lb 0.3 oz) (07/29 0500) FIO2  RA  PHYSICAL EXAMINATION:  GEN: A/Ox3; pleasant , NAD,obese  HEENT: [place]/AT, EACs-clear, TMs-wnl, NOSE-clear, THROAT-clear, no lesions, no postnasal drip or exudate noted.  NECK: Supple w/ fair ROM; no JVD; normal carotid impulses w/o bruits; no thyromegaly or nodules palpated; no lymphadenopathy.  RESP Diminshed BS in bases R > L . no accessory muscle  use, no dullness to percussion  CARD: RRR, no m/r/g , tr peripheral edema with venous insufficiency changes , pulses intact, no cyanosis or clubbing.  GI: Soft & nt; nml bowel sounds; no organomegaly or masses detected.  Musco: Warm bil, no deformities or joint swelling noted.  Neuro: alert, no focal deficits noted.  Skin: Warm, no lesions or rashes    PULMONARY No results found for this basename: PHART, PCO2, PCO2ART, PO2, PO2ART, HCO3, TCO2, O2SAT,  in the last 168 hours  CBC  Recent Labs Lab 04/19/13 1905 04/20/13 0503  HGB 12.1 11.8*  HCT 38.8 37.5  WBC 15.9* 14.2*  PLT 304 294    COAGULATION  Recent Labs Lab 04/19/13 1905  INR 1.41    CARDIAC  No results found for this basename: TROPONINI,  in the last 168 hours  Recent Labs Lab 04/19/13 1912  PROBNP 293.5*     CHEMISTRY  Recent Labs Lab 04/19/13 1905 04/20/13 0503  NA 138 136  K 4.0 4.4  CL 98 99  CO2 28 27  GLUCOSE 116* 115*  BUN 22 20  CREATININE 1.21* 1.00  CALCIUM 9.8 9.4  MG 2.1  --   PHOS 3.9  --    Estimated Creatinine Clearance: 63.4 ml/min (by C-G formula based on Cr of 1).   LIVER  Recent Labs Lab 04/19/13 1905  AST 12  ALT 9  ALKPHOS 98  BILITOT 0.4  PROT 7.5  ALBUMIN 3.5  INR 1.41     INFECTIOUS No results found for this basename: LATICACIDVEN, PROCALCITON,  in the last 168 hours   ENDOCRINE CBG (last 3)  No results found for this basename: GLUCAP,  in the last 72 hours       IMAGING x48h  X-ray Chest Pa And Lateral   04/20/2013   *RADIOLOGY REPORT*  Clinical Data: Follow up right pleural effusion.  CHEST - 2 VIEW  Comparison: Two-view chest x-ray yesterday and 04/09/2013.  Findings: Large right pleural effusion, unchanged since yesterday but increased in size since 04/09/2013.  Associated dense consolidation in the right middle lobe and right lower lobe. Linear atelectasis in the inferior right upper lobe.  Left lung clear.  No left pleural effusion.   Cardiac silhouette normal in size, unchanged.  Left subclavian dual lead transvenous pacemaker unchanged in intact.  IMPRESSION:  1.  Large right pleural effusion, unchanged since yesterday but increased in size since the examination 11 days ago. 2.  Associated dense passive atelectasis and/or pneumonia in the right middle lobe and right lower lobe.  Mild linear atelectasis in the right upper lobe. 3.  Left lung remains clear.   Original Report Authenticated By: Hulan Saas, M.D.   Dg Chest 2 View  04/19/2013   *RADIOLOGY REPORT*  Clinical Data: Dyspnea, pleural effusion  CHEST - 2 VIEW  Comparison: An 04/09/2013  Findings: The heart size and vascular pattern are normal.  Cardiac pacer is in unchanged position.  The left lung is clear.  There is a moderate-to-large right pleural effusion which is mildly to moderately larger when compared to the prior  study.  IMPRESSION:  Moderate to large right pleural effusion increased from the prior examination.   Original Report Authenticated By: Esperanza Heir, M.D.      ASSESSMENT / PLAN: 1. Worsening  right sided large pleural effusion s/p cardiac surgery (ablation) on 03/02/13 on Eliquis for Atrial Fib.  ? Parapneumonic process with low grade fever although recent cbc w/ nml wbc or post cardiac injury syndrome (Dressler's like - though note the echo 7/8 showed no pericardial effusion making this much less likely)  Will need thoracentesis but on Eliquis .   04/20/13: effusion still +   Plan  Admit to [facility]  Ordereed thora for 04/21/13 Hold Eliquis - will need thoracentesis (SCD )  Empiric antibiotics O2 for sat >90% Thoracentesis w/ fluid analysis when safe off anticoagulants  - Risks of pneumothorax, hemothorax, sedation/anesthesia complications such as cardiac or respiratory arrest or hypotension, stroke and bleeding all explained. Benefits of diagnosis but limitations of non-diagnosis also explained. Patient verbalized understanding and wished to  proceed.    2. Hx of Chronic Atrial Fib with recent convergent ablation 03/02/13 on Eliquis/amiodarone/cardizem.  Chronic Diastolic Heart Failure  Sick Sinus syndrome s/p pacemaker 02/2013   Plan  Hold Eliquis  SCD  Cont amiodaone /Cardizem  EKG  Check bnp      3. Hypothyroid  Plan  Await tsh  Cont synthroid      Dr. Kalman Shan, M.D., [facility].C.P Pulmonary and Critical Care Medicine Staff Physician [facility] Pager: [phone], If no answer or between  15:00h - 7:00h: call [phone]  04/20/2013 12:16 PM

## 2013-04-20 NOTE — Assessment & Plan Note (Signed)
She has developed pleural effusion on the right with pleuritic chest pain after recent cardiac surgery.  She has chest xray from February 2014 which showed faint Rt lower lobe infiltrate >> significance of this is uncertain.  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  She will need thoracentesis for both therapeutic and diagnostic purposes.  This will be arranged with interventional radiology.  Will need to coordinate when she should stop taking eliquis with timing of thoracentesis.  Will request fluid to be sent for glucose, protein, LDH, cell count with differential, adenosine deaminase, gram stain and culture, AFB with culture, fungal culture, and cytology.    Depending on fluid analysis results will determine if further interventions are needed.

## 2013-04-21 ENCOUNTER — Inpatient Hospital Stay (HOSPITAL_COMMUNITY): Payer: Medicare Other

## 2013-04-21 DIAGNOSIS — E039 Hypothyroidism, unspecified: Secondary | ICD-10-CM | POA: Diagnosis present

## 2013-04-21 DIAGNOSIS — J90 Pleural effusion, not elsewhere classified: Secondary | ICD-10-CM | POA: Diagnosis not present

## 2013-04-21 DIAGNOSIS — I4891 Unspecified atrial fibrillation: Secondary | ICD-10-CM | POA: Diagnosis not present

## 2013-04-21 LAB — CHOLESTEROL, BODY FLUID

## 2013-04-21 LAB — CBC
MCH: 28.2 pg (ref 26.0–34.0)
MCV: 91.7 fL (ref 78.0–100.0)
Platelets: 241 10*3/uL (ref 150–400)
RDW: 14.5 % (ref 11.5–15.5)
WBC: 9.4 10*3/uL (ref 4.0–10.5)

## 2013-04-21 LAB — FUNGAL STAIN

## 2013-04-21 LAB — TROPONIN I: Troponin I: <0.3 ng/mL (ref <0.30)

## 2013-04-21 MED ORDER — ENOXAPARIN SODIUM 40 MG/0.4ML ~~LOC~~ SOLN
40.0000 mg | SUBCUTANEOUS | Status: DC
  Administered 2013-04-22: 40 mg via SUBCUTANEOUS
  Filled 2013-04-21 (×3): qty 0.4

## 2013-04-21 MED ORDER — LEVOTHYROXINE SODIUM 200 MCG PO TABS
200.0000 ug | ORAL_TABLET | Freq: Every day | ORAL | Status: DC
  Administered 2013-04-22 – 2013-04-23 (×2): 200 ug via ORAL
  Filled 2013-04-21 (×3): qty 1

## 2013-04-21 MED ORDER — IOHEXOL 300 MG/ML  SOLN
80.0000 mL | Freq: Once | INTRAMUSCULAR | Status: AC | PRN
  Administered 2013-04-21: 80 mL via INTRAVENOUS

## 2013-04-21 MED ORDER — METHYLPREDNISOLONE SODIUM SUCC 125 MG IJ SOLR
80.0000 mg | Freq: Two times a day (BID) | INTRAMUSCULAR | Status: DC
  Administered 2013-04-21 – 2013-04-22 (×2): 80 mg via INTRAVENOUS
  Filled 2013-04-21 (×4): qty 1.28

## 2013-04-21 NOTE — Progress Notes (Signed)
PULMONARY  / CRITICAL CARE MEDICINE  Name: Kayla Harrison MRN: [id number] DOB: 09/05/1946    ADMISSION DATE:  04/19/2013   PRIMARY SERVICE: PCCM   CHIEF COMPLAINT:  Increased shortness of breath   BRIEF PATIENT DESCRIPTION:   6 yowf  with Chronic  AF s/p recent convergent ablation procedure performed at [facility] 03/02/2013. Her convergent ablation procedure was complicated by recurrent rapid AF, right-sided pleural effusion, anemia, recurrent rapid AF and bradycardia with sick sinus syndrome requiring PPM implantation.  Since dishcarge has had difficulty with cough, dyspnea. Was seen by PCP found to have persistent right sided moderate effusion. Admitted on 7/28 for pleuritic CP and worsening SOB.    SIGNIFICANT EVENTS / STUDIES:  R Tcentesis  7/29 1.5 liters: S LDH: 1667/ P LDH: 185;P cell count: cloudy, amber, wbc 2565, neut 61; p cholest: 76; pleural protein 4.2  C/w exudate acute inflammation c/w dresslers' > steroid rx 7/30 started  LINES / TUBES:   CULTURES: Pleural fluid 7/29>>> Fungal stain 7/29: neg  afb 7/29>>>   ANTIBIOTICS: Maxipime 7/28 >> Vanc 7/28 >>   SUBJECTIVE/OVERNIGHT/INTERVAL HX Feels worse again with increased R pleuritic cp  VITAL SIGNS: Temp:  [97.7 F (36.5 C)-99 F (37.2 C)] 97.7 F (36.5 C) (07/30 1434) Pulse Rate:  [71-78] 73 (07/30 1434) Resp:  [14-18] 18 (07/30 0551) BP: (98-121)/(52-70) 112/52 mmHg (07/30 1434) SpO2:  [93 %-96 %] 96 % (07/30 1434) Weight:  [98.8 kg (217 lb 13 oz)] 98.8 kg (217 lb 13 oz) (07/30 0551) FIO2  RA  PHYSICAL EXAMINATION:  GEN: A/Ox3; pleasant , NAD,obese  HEENT: [place]/AT, EACs-clear, TMs-wnl, NOSE-clear, THROAT-clear, no lesions, no postnasal drip or exudate noted.  NECK: Supple w/ fair ROM; no JVD; normal carotid impulses w/o bruits; no thyromegaly or nodules palpated; no lymphadenopathy.  RESP Diminshed BS in bases R > L . no accessory muscle use, no dullness to percussion  CARD: RRR, no m/r/g , tr peripheral  edema with venous insufficiency changes , pulses intact, no cyanosis or clubbing.  GI: Soft & nt; nml bowel sounds; no organomegaly or masses detected.  Musco: Warm bil, no deformities or joint swelling noted.  Neuro: alert, no focal deficits noted.  Skin: Warm, no lesions or rashes    PULMONARY No results found for this basename: PHART, PCO2, PCO2ART, PO2, PO2ART, HCO3, TCO2, O2SAT,  in the last 168 hours  CBC  Recent Labs Lab 04/19/13 1905 04/20/13 0503 04/21/13 0405  HGB 12.1 11.8* 10.5*  HCT 38.8 37.5 34.2*  WBC 15.9* 14.2* 9.4  PLT 304 294 241    COAGULATION  Recent Labs Lab 04/19/13 1905  INR 1.41    CARDIAC    Recent Labs Lab 04/21/13 0405  TROPONINI <0.30    Recent Labs Lab 04/19/13 1912  PROBNP 293.5*     CHEMISTRY  Recent Labs Lab 04/19/13 1905 04/20/13 0503 04/21/13 0405  NA 138 136  --   K 4.0 4.4  --   CL 98 99  --   CO2 28 27  --   GLUCOSE 116* 115* 108*  BUN 22 20  --   CREATININE 1.21* 1.00  --   CALCIUM 9.8 9.4  --   MG 2.1  --   --   PHOS 3.9  --   --    Estimated Creatinine Clearance: 63.1 ml/min (by C-G formula based on Cr of 1).   LIVER  Recent Labs Lab 04/19/13 1905 04/20/13 0503 04/21/13 0405  AST 12  --   --  ALT 9  --   --   ALKPHOS 98  --   --   BILITOT 0.4  --   --   PROT 7.5 7.0  --   ALBUMIN 3.5  --  2.3*  INR 1.41  --   --      INFECTIOUS No results found for this basename: LATICACIDVEN, PROCALCITON,  in the last 168 hours   ENDOCRINE CBG (last 3)  No results found for this basename: GLUCAP,  in the last 72 hours       IMAGING x48h  X-ray Chest Pa And Lateral   04/20/2013   *RADIOLOGY REPORT*  Clinical Data: Follow up right pleural effusion.  CHEST - 2 VIEW  Comparison: Two-view chest x-ray yesterday and 04/09/2013.  Findings: Large right pleural effusion, unchanged since yesterday but increased in size since 04/09/2013.  Associated dense consolidation in the right middle lobe and right  lower lobe. Linear atelectasis in the inferior right upper lobe.  Left lung clear.  No left pleural effusion.  Cardiac silhouette normal in size, unchanged.  Left subclavian dual lead transvenous pacemaker unchanged in intact.  IMPRESSION:  1.  Large right pleural effusion, unchanged since yesterday but increased in size since the examination 11 days ago. 2.  Associated dense passive atelectasis and/or pneumonia in the right middle lobe and right lower lobe.  Mild linear atelectasis in the right upper lobe. 3.  Left lung remains clear.   Original Report Authenticated By: Hulan Saas, M.D.   Dg Chest Port 1 View  04/20/2013   *RADIOLOGY REPORT*  Clinical Data: Post right thoracentesis.  PORTABLE CHEST - 1 VIEW 04/20/2013 1554 hours:  Comparison: Two-view chest x-ray earlier same date 0804 hours.  Findings: No evidence of pneumothorax after right thoracentesis. Residual small to moderate-sized right pleural effusion.  Improved aeration of the right lung base, though moderate passive atelectasis persists.  Left lung remains clear.  No visible left pleural effusion.  Cardiac silhouette upper normal in size, unchanged.  Left subclavian dual lead transvenous pacemaker unchanged.  IMPRESSION: No evidence of pneumothorax after right thoracentesis.  Residual small moderate-sized right pleural effusion.  Improved aeration at the right lung base, though moderate passive atelectasis persists.   Original Report Authenticated By: Hulan Saas, M.D.   US Thoracentesis Asp Pleural Space W/img Guide  04/20/2013   *RADIOLOGY REPORT*  Clinical Data:  Large right-sided pleural effusion.  Request for diagnostic and therapeutic thoracentesis.  ULTRASOUND GUIDED right THORACENTESIS  Comparison:  None  An ultrasound guided thoracentesis was thoroughly discussed with the patient and questions answered.  The benefits, risks, alternatives and complications were also discussed.  The patient understands and wishes to proceed with  the procedure.  Written consent was obtained.  Ultrasound was performed to localize and mark an adequate pocket of fluid in the right chest.  The area was then prepped and draped in the normal sterile fashion.  1% Lidocaine was used for local anesthesia.  Under ultrasound guidance a 19 gauge Yueh catheter was introduced.  Thoracentesis was performed.  The catheter was removed and a dressing applied.  Complications:  None immediate  Findings: A total of approximately 1.5 liters of blood tinged fluid was removed. A fluid sample was sent for laboratory analysis.  IMPRESSION: Successful ultrasound guided right thoracentesis yielding 1.5 liters of pleural fluid.  Read by Brayton El PA-C   Original Report Authenticated By: Jolaine Click, M.D.      ASSESSMENT / PLAN: Exudative right sided large pleural effusion s/p  cardiac surgery (ablation) on 03/02/13 on Eliquis for Atrial Fib.  S/p thora yielding 1.5 liters on 7/29  ? Parapneumonic process with low grade fever although recent cbc w/ nml wbc or post cardiac injury syndrome (Dressler's like - though note the echo 7/8 showed no pericardial effusion making this less likely unless there's a hole in the pericardium from the surgery  Plan  Empiric antibiotics O2 for sat >90% CT chest eval pleural effusion   2.  Hx of Chronic Atrial Fib with recent convergent ablation 03/02/13 on Eliquis/amiodarone/cardizem.  Chronic Diastolic Heart Failure  Sick Sinus syndrome s/p pacemaker 02/2013  Plan  Hold Eliquis for now. Need to decide on effusion  SCD  Cont amiodaone /Cardizem      3. Hypothyroid  Lab Results  Component Value Date   TSH 9.172* 04/19/2013    Plan    synthroid dose > increased to 200 7/30     Sandrea Hughs, MD Pulmonary and Critical Care Medicine [facility] Cell [phone] After 5:30 PM or weekends, call [phone]

## 2013-04-22 ENCOUNTER — Inpatient Hospital Stay (HOSPITAL_COMMUNITY): Payer: Medicare Other

## 2013-04-22 ENCOUNTER — Ambulatory Visit (HOSPITAL_COMMUNITY): Payer: Medicare Other

## 2013-04-22 DIAGNOSIS — J90 Pleural effusion, not elsewhere classified: Secondary | ICD-10-CM | POA: Diagnosis not present

## 2013-04-22 DIAGNOSIS — I4891 Unspecified atrial fibrillation: Secondary | ICD-10-CM | POA: Diagnosis not present

## 2013-04-22 DIAGNOSIS — R05 Cough: Secondary | ICD-10-CM | POA: Diagnosis not present

## 2013-04-22 LAB — COMPREHENSIVE METABOLIC PANEL
ALT: 7 U/L (ref 0–35)
AST: 9 U/L (ref 0–37)
Alkaline Phosphatase: 80 U/L (ref 39–117)
CO2: 27 mEq/L (ref 19–32)
Chloride: 98 mEq/L (ref 96–112)
GFR calc Af Amer: 83 mL/min — ABNORMAL LOW (ref >90)
GFR calc non Af Amer: 72 mL/min — ABNORMAL LOW (ref >90)
Glucose, Bld: 156 mg/dL — ABNORMAL HIGH (ref 70–99)
Sodium: 135 mEq/L (ref 135–145)
Total Bilirubin: 0.2 mg/dL — ABNORMAL LOW (ref 0.3–1.2)

## 2013-04-22 LAB — CBC
Hemoglobin: 11.1 g/dL — ABNORMAL LOW (ref 12.0–15.0)
Platelets: 276 10*3/uL (ref 150–400)
RBC: 4.04 MIL/uL (ref 3.87–5.11)
WBC: 10.5 10*3/uL (ref 4.0–10.5)

## 2013-04-22 MED ORDER — PREDNISONE 20 MG PO TABS
40.0000 mg | ORAL_TABLET | Freq: Every day | ORAL | Status: DC
  Administered 2013-04-23: 40 mg via ORAL
  Filled 2013-04-22 (×2): qty 2

## 2013-04-22 NOTE — Progress Notes (Signed)
Assumed care of patient from previous RN. Condition stable. Cont with current plan of care

## 2013-04-22 NOTE — Progress Notes (Addendum)
ANTIBIOTIC CONSULT NOTE - FOLLOW UP  Pharmacy Consult for Vancomycin/Cefepime Indication: pneumonia/pleural effusion  Allergies  Allergen Reactions  . Tikosyn (Dofetilide)     Prolonged QT interval  . Xarelto (Rivaroxaban)     Nose Bleed X 6 hrs ; packing in ER  . Epinephrine Other (See Comments) and Rash    Dental form only (liquid). Patient stated she will become "out of it" she can hear you but cannot respond in normal fashion.  Marland Kitchen Ketamine     Hallucinations    Patient Measurements: Height: 5' 5.35" (166 cm) (Documented in chart7/28/14) Weight: 217 lb 6 oz (98.6 kg) IBW/kg (Calculated) : 57.81   Vital Signs: Temp: 98.5 F (36.9 C) (07/31 0526) Temp src: Oral (07/31 0526) BP: 126/65 mmHg (07/31 0526) Pulse Rate: 78 (07/31 0526) Intake/Output from previous day: 07/30 0701 - 07/31 0700 In: 340 [P.O.:300; IV Piggyback:40] Out: 1700 [Urine:1700] Intake/Output from this shift: Total I/O In: 240 [P.O.:240] Out: 400 [Urine:400]  Labs:  Recent Labs  04/19/13 1905 04/20/13 0503 04/21/13 0405 04/22/13 0815  WBC 15.9* 14.2* 9.4 10.5  HGB 12.1 11.8* 10.5* 11.1*  PLT 304 294 241 276  CREATININE 1.21* 1.00  --  0.82   Estimated Creatinine Clearance: 76.8 ml/min (by C-G formula based on Cr of 0.82).  Recent Labs  04/22/13 0815  VANCOTROUGH 15.0     Microbiology: Recent Results (from the past 720 hour(s))  AFB CULTURE WITH SMEAR     Status: None   Collection Time    04/20/13  3:25 PM      Result Value Range Status   Specimen Description PLEURAL   Final   Special Requests NONE   Final   ACID FAST SMEAR NO ACID FAST BACILLI SEEN   Final   Culture     Final   Value: CULTURE WILL BE EXAMINED FOR 6 WEEKS BEFORE ISSUING A FINAL REPORT   Report Status PENDING   Incomplete  BODY FLUID CULTURE     Status: None   Collection Time    04/20/13  3:25 PM      Result Value Range Status   Specimen Description PLEURAL   Final   Special Requests NONE   Final   Gram Stain      Final   Value: MODERATE WBC PRESENT,BOTH PMN AND MONONUCLEAR     NO ORGANISMS SEEN   Culture NO GROWTH   Final   Report Status PENDING   Incomplete  FUNGAL STAIN     Status: None   Collection Time    04/20/13  3:25 PM      Result Value Range Status   Specimen Description PLEURAL   Final   Special Requests NONE   Final   Fungal Smear NO YEAST OR FUNGAL ELEMENTS SEEN   Final   Report Status 04/21/2013 FINAL   Final    Anti-infectives   Start     Dose/Rate Route Frequency Ordered Stop   04/20/13 2100  vancomycin (VANCOCIN) IVPB 1000 mg/200 mL premix     1,000 mg 200 mL/hr over 60 Minutes Intravenous Every 12 hours 04/20/13 1058     04/19/13 2200  ceFEPIme (MAXIPIME) 1 g in dextrose 5 % 50 mL IVPB     1 g 100 mL/hr over 30 Minutes Intravenous 3 times per day 04/19/13 2025     04/19/13 2100  vancomycin (VANCOCIN) IVPB 750 mg/150 ml premix  Status:  Discontinued     750 mg 150 mL/hr over 60 Minutes Intravenous  Every 12 hours 04/19/13 2024 04/20/13 1058      Assessment: 66 yoF on day #2 vancomycin 750 mg IV q12h and cefepime 1g IV q8h for suspected pneumonia.  Was scheduled for thoracentesis on 7/31 for persistent right sided pleural effusion s/p cardiac ablation procedure 6/10; however, pt has had worsening symptoms of pleuritic pain, orthopnea, and cough and was admitted for evaluation and was initiated on empiric antibiotics.  7/29 CXR impression report: Large right pleural effusion and associated dense passive atelectasis and/or pneumonia in the right middle lobe and right lower lobe.   SCr improved to 0.82, CrCl~ 73 ml/min/1.31m2 (normalized), ~99 ml/min (CG)  Afebrile, WBC now WNL  Weight 98.6 kg  Cultures no growth to date.  Vancomycin trough therapeutic at 15.0 with vancomycin dosing at 1g IV q12h.  Goal of Therapy:  Vancomycin trough level 15-20 mcg/ml Eradication of infection, doses adjusted per renal clearance  Plan:  1.  Continue vancomycin to 1g IV q12h. 2.   Continue Cefepime 1g IV q8h.  Clance Boll 04/22/2013,10:33 AM

## 2013-04-22 NOTE — Progress Notes (Signed)
PULMONARY  / CRITICAL CARE MEDICINE  Name: Kayla Harrison MRN: [id number] DOB: 19-Mar-1946    ADMISSION DATE:  04/19/2013   PRIMARY SERVICE: PCCM   CHIEF COMPLAINT:  Increased shortness of breath   BRIEF PATIENT DESCRIPTION:   6 yowf  with Chronic  AF s/p recent convergent ablation procedure performed at [facility] 03/02/2013. Her convergent ablation procedure was complicated by recurrent rapid AF, right-sided pleural effusion, anemia, recurrent rapid AF and bradycardia with sick sinus syndrome requiring PPM implantation.  Since dishcarge has had difficulty with cough, dyspnea. Was seen by PCP found to have persistent right sided moderate effusion. Admitted on 7/28 for pleuritic CP and worsening SOB.    SIGNIFICANT EVENTS / STUDIES:  R Tcentesis  7/29 1.5 liters: S LDH: 1667/ P LDH: 185;P cell count: cloudy, amber, wbc 2565, neut 61; p cholest: 76; pleural protein 4.2  C/w exudate acute inflammation c/w dresslers' > steroid rx 7/30 started CTc hest 7/30 >>Residual moderate sized right pleural effusion which appears  simple in density.-compressive atelectasis of the right lower lobe.     LINES / TUBES:   CULTURES: Pleural fluid 7/29>>> Fungal stain 7/29: neg  afb 7/29>>>   ANTIBIOTICS: Maxipime 7/28 >> Vanc 7/28 >>7/30   SUBJECTIVE/OVERNIGHT/INTERVAL HX Feels worse again with increased R pleuritic cp afebrile  VITAL SIGNS: Temp:  [97.7 F (36.5 C)-98.5 F (36.9 C)] 98.5 F (36.9 C) (07/31 0526) Pulse Rate:  [73-78] 78 (07/31 0526) Resp:  [18] 18 (07/31 0526) BP: (112-126)/(52-65) 126/65 mmHg (07/31 0526) SpO2:  [92 %-96 %] 92 % (07/31 0526) Weight:  [98.6 kg (217 lb 6 oz)] 98.6 kg (217 lb 6 oz) (07/31 0526) FIO2  RA  PHYSICAL EXAMINATION:  GEN: A/Ox3; pleasant , NAD,obese  HEENT: [place]/AT, EACs-clear, TMs-wnl, NOSE-clear, THROAT-clear, no lesions, no postnasal drip or exudate noted.  NECK: Supple w/ fair ROM; no JVD; normal carotid impulses w/o bruits; no thyromegaly or  nodules palpated; no lymphadenopathy.  RESP Diminshed BS in bases R > L . no accessory muscle use, no dullness to percussion  CARD: RRR, no m/r/g , tr peripheral edema with venous insufficiency changes , pulses intact, no cyanosis or clubbing.  GI: Soft & nt; nml bowel sounds; no organomegaly or masses detected.  Musco: Warm bil, no deformities or joint swelling noted.  Neuro: alert, no focal deficits noted.  Skin: Warm, no lesions or rashes    PULMONARY No results found for this basename: PHART, PCO2, PCO2ART, PO2, PO2ART, HCO3, TCO2, O2SAT,  in the last 168 hours  CBC  Recent Labs Lab 04/20/13 0503 04/21/13 0405 04/22/13 0815  HGB 11.8* 10.5* 11.1*  HCT 37.5 34.2* 36.3  WBC 14.2* 9.4 10.5  PLT 294 241 276    COAGULATION  Recent Labs Lab 04/19/13 1905  INR 1.41    CARDIAC    Recent Labs Lab 04/21/13 0405  TROPONINI <0.30    Recent Labs Lab 04/19/13 1912  PROBNP 293.5*     CHEMISTRY  Recent Labs Lab 04/19/13 1905 04/20/13 0503 04/21/13 0405 04/22/13 0815  NA 138 136  --  135  K 4.0 4.4  --  4.4  CL 98 99  --  98  CO2 28 27  --  27  GLUCOSE 116* 115* 108* 156*  BUN 22 20  --  18  CREATININE 1.21* 1.00  --  0.82  CALCIUM 9.8 9.4  --  9.2  MG 2.1  --   --   --   PHOS 3.9  --   --   --  Estimated Creatinine Clearance: 76.8 ml/min (by C-G formula based on Cr of 0.82).   LIVER  Recent Labs Lab 04/19/13 1905 04/20/13 0503 04/21/13 0405 04/22/13 0815  AST 12  --   --  9  ALT 9  --   --  7  ALKPHOS 98  --   --  80  BILITOT 0.4  --   --  0.2*  PROT 7.5 7.0  --  6.3  ALBUMIN 3.5  --  2.3* 2.5*  INR 1.41  --   --   --      INFECTIOUS  Recent Labs Lab 04/21/13 1700  PROCALCITON <0.10     ENDOCRINE CBG (last 3)  No results found for this basename: GLUCAP,  in the last 72 hours       IMAGING x48h  Ct Chest W Contrast  04/21/2013   *RADIOLOGY REPORT*  Clinical Data: Status post cardiac ablation for atrial fibrillation  with recurrent right-sided pleural effusion and anemia.  Status post removal of 1.5 liters of right pleural fluid on 04/20/2013.  CT CHEST WITH CONTRAST  Technique:  Multidetector CT imaging of the chest was performed following the standard protocol during bolus administration of intravenous contrast.  Contrast: 80mL OMNIPAQUE IOHEXOL 300 MG/ML  SOLN  Comparison: Chest x-ray on 04/20/2013.  Findings: A dependent and relatively simple appearing moderate sized pleural effusion is present on the right causing compressive atelectasis of the right lower lobe.  Fluid density measurements are consistent with simple fluid.  There is no evidence of loculation or pleural enhancement.  No pleural masses are identified.  Lungs show no evidence of pulmonary edema or nodule.  The heart size is normal.  A pacemaker is in place and a left atrial appendage clip is also present.  There is some calcified atherosclerotic plaque noted in the distribution of the LAD.  The thoracic aorta is of normal caliber.  No enlarged lymph nodes are identified.  Bony structures show mild degenerative changes throughout the thoracic spine.  The visualized upper abdomen shows a gastric band around the proximal stomach.  IMPRESSION:  1.  Residual moderate sized right pleural effusion which appears simple in density.  There is no evidence to suggest empyema, hemothorax or pleural tumor. 2.  Coronary atherosclerosis with calcified plaque noted in the distribution of the LAD.   Original Report Authenticated By: Irish Lack, M.D.   Dg Chest Port 1 View  04/20/2013   *RADIOLOGY REPORT*  Clinical Data: Post right thoracentesis.  PORTABLE CHEST - 1 VIEW 04/20/2013 1554 hours:  Comparison: Two-view chest x-ray earlier same date 0804 hours.  Findings: No evidence of pneumothorax after right thoracentesis. Residual small to moderate-sized right pleural effusion.  Improved aeration of the right lung base, though moderate passive atelectasis persists.  Left  lung remains clear.  No visible left pleural effusion.  Cardiac silhouette upper normal in size, unchanged.  Left subclavian dual lead transvenous pacemaker unchanged.  IMPRESSION: No evidence of pneumothorax after right thoracentesis.  Residual small moderate-sized right pleural effusion.  Improved aeration at the right lung base, though moderate passive atelectasis persists.   Original Report Authenticated By: Hulan Saas, M.D.   US Thoracentesis Asp Pleural Space W/img Guide  04/20/2013   *RADIOLOGY REPORT*  Clinical Data:  Large right-sided pleural effusion.  Request for diagnostic and therapeutic thoracentesis.  ULTRASOUND GUIDED right THORACENTESIS  Comparison:  None  An ultrasound guided thoracentesis was thoroughly discussed with the patient and questions answered.  The benefits, risks, alternatives and  complications were also discussed.  The patient understands and wishes to proceed with the procedure.  Written consent was obtained.  Ultrasound was performed to localize and mark an adequate pocket of fluid in the right chest.  The area was then prepped and draped in the normal sterile fashion.  1% Lidocaine was used for local anesthesia.  Under ultrasound guidance a 19 gauge Yueh catheter was introduced.  Thoracentesis was performed.  The catheter was removed and a dressing applied.  Complications:  None immediate  Findings: A total of approximately 1.5 liters of blood tinged fluid was removed. A fluid sample was sent for laboratory analysis.  IMPRESSION: Successful ultrasound guided right thoracentesis yielding 1.5 liters of pleural fluid.  Read by Brayton El PA-C   Original Report Authenticated By: Jolaine Click, M.D.      ASSESSMENT / PLAN: Exudative right sided large pleural effusion s/p cardiac surgery (ablation) on 03/02/13 on Eliquis for Atrial Fib.  S/p thora yielding 1.5 liters on 7/29  ? Parapneumonic process (note 60% polys in fuid) with low grade fever although recent cbc w/ nml  wbc or post cardiac injury syndrome (Dressler's like )- though note the echo 7/8 & CT  showed no pericardial effusion , also unilateral effusion making this less likely unless there's a hole in the pericardium from the surgery  Plan  Empiric antibiotics O2 for sat >90% Rpt thora for symptomatic relief today  2.  Hx of Chronic Atrial Fib with recent convergent ablation 03/02/13 on Eliquis/amiodarone/cardizem.  Chronic Diastolic Heart Failure  Sick Sinus syndrome s/p pacemaker 02/2013  Plan  Hold Eliquis for now. Need to decide on effusion  SCD  Cont amiodaone /Cardizem  She will need FUw ith dr Graciela Husbands on dc     3. Hypothyroid  Lab Results  Component Value Date   TSH 9.172* 04/19/2013    Plan    synthroid dose > increased to 200 7/30 for TSH 9   Discussed with pt & sister in detail Can discharge in am with FU in 3ds with CXR in office  Cyril Mourning MD. [facility]. [facility] Pager [phone] If no response call [phone]

## 2013-04-22 NOTE — Discharge Summary (Signed)
Physician Discharge Summary     Patient ID: Kayla Harrison MRN: [id number] DOB/AGE: 1945-11-11 67 y.o.  Admit date: 04/19/2013 Discharge date: 04/23/2013  Discharge Diagnoses:  Exudative right Pleural effusion Chronic atrial fibrillation  Chronic Diastolic Heart Failure  Sick Sinus syndrome s/p pacemaker 02/2013  Hypothyroid   Detailed Hospital Course:   56 yowf with Chronic AF s/p recent convergent ablation procedure performed at [facility] 03/02/2013. Her convergent ablation procedure was complicated by recurrent rapid AF, right-sided pleural effusion, anemia, recurrent rapid AF and bradycardia with sick sinus syndrome requiring PPM implantation. She was started on Tikosyn -unable to tolerate d/t prolonged QT . Then restarted on Amiodarone. [facility] indicated she had a moderate right sided pleural effusion prior to discharge. Since dishcarge has had difficulty with cough, dyspnea. Was seen by PCP found to have persistent right sided moderate effusion . Referred to pulmonary on 04/16/13 . Pt was set up for thoracentesis on 7/31 with Eliquis to be held 48hr prior to procedure. Admitted n 7/28 w/ report of 2 days of worsening symptoms  with more pleuritic pain on right, orthopnea and cough. Had low grade fever in office. She was admitted to the hospital. Placed on empiric antibiotics and her Eliquis was held. She underwent diagnostic and therapeutic thoracentesis on 7/29. These findings were most consistent with Parapneumonic process (note 60% polys in fuid) with low grade fever r post cardiac injury syndrome (Dressler's like )- though note the echo 7/8 & CT showed no pericardial effusion , also unilateral effusion making this less likely unless there's a hole in the pericardium from the surgery. Her cytology was negative. Her CXR still demonstrated effusion s/p drainage and she continued to have pleuritic type chest pain so we repeated CT scan. This continued to show moderate sized  right sided free flowing effusion,  and RLL consolidation which could represent a PNA. There were no loculations.  She underwent repeat thoracentesis on 7/31 with goals to drain dry. Another 1.7 liters of blood tinged fluid was removed. F/U CXR showed  Very small pleural fluid with some residual atelectasis, volume a little worse in f/u film but still small. She was discharged to home with instructions to continue antibiotics, and continue steroid taper with follow up in our clinic for cxr.    Discharge Plan by diagnoses  Exudative right sided large pleural effusion: Dressler's vs parapneumonic  Discharge Plan: F/u CXR  Complete antibiotic and steroid taper   Hx of Chronic Atrial Fib with recent convergent ablation 03/02/13 on Eliquis/amiodarone/cardizem.  Chronic Diastolic Heart Failure  Sick Sinus syndrome s/p pacemaker 02/2013  Discharge Plan: HOLD Eloquis-->resume if effusion is stable on f/u film   Cont amiodaone /Cardizem   Hypothyroid  Discharge Plan: synthroid dose > increased to 200 7/30 for TSH 9    Significant Hospital tests/ studies/ interventions and procedures  7/29 Pleural fluid studies:  Pleural fluid Serum   LDH 185 LDH 167  Protein 4.2 Protein 7.0  Afb: No AFB (prelim)   Culture: NGTD   Fungal: none   Hct: <2.0   Color: amber/ cloudy   Wbc:2565   Neutrophils: 61   Lymph: 29    Cholesterol: 76   Ph: 8   Rheumatoid factor (pending)   Adenosine Deaminase: 9.6   Glucose: 129   Cytology: negative      Discharge Exam: BP 140/70  Pulse 76  Temp(Src) 97.9 F (36.6 C) (Oral)  Resp 18  Ht 5' 5.35" (1.66 m)  Wt 97.9 kg (215 lb 13.3 oz)  BMI 35.53 kg/m2  SpO2 95%  GEN: A/Ox3; pleasant , NAD,obese  HEENT: [place]/AT, EACs-clear, TMs-wnl, NOSE-clear, THROAT-clear, no lesions, no postnasal drip or exudate noted.  NECK: Supple w/ fair ROM; no JVD; normal carotid impulses w/o bruits; no thyromegaly or nodules palpated; no lymphadenopathy.  RESP Diminshed BS in bases R  > L . no accessory muscle use, no dullness to percussion  CARD: RRR, no m/r/g , tr peripheral edema with venous insufficiency changes , pulses intact, no cyanosis or clubbing.  GI: Soft & nt; nml bowel sounds; no organomegaly or masses detected.  Musco: Warm bil, no deformities or joint swelling noted.  Neuro: alert, no focal deficits noted.  Skin: Warm, no lesions or rashes    Labs at discharge Lab Results  Component Value Date   CREATININE 0.83 04/23/2013   BUN 20 04/23/2013   NA 137 04/23/2013   K 4.4 04/23/2013   CL 100 04/23/2013   CO2 28 04/23/2013   Lab Results  Component Value Date   WBC 15.3* 04/23/2013   HGB 10.6* 04/23/2013   HCT 34.5* 04/23/2013   MCV 89.1 04/23/2013   PLT 325 04/23/2013   Lab Results  Component Value Date   ALT 7 04/22/2013   AST 9 04/22/2013   ALKPHOS 80 04/22/2013   BILITOT 0.2* 04/22/2013   Lab Results  Component Value Date   INR 1.41 04/19/2013   INR 2.6 02/24/2013   INR 2.6 02/04/2013   PROTIME 20.1 03/02/2009    Current radiology studies Dg Chest 1 View  04/22/2013   *RADIOLOGY REPORT*  Clinical Data:  Recent right-sided thoracentesis.  Coughing.  CHEST - 1 VIEW  Comparison: Chest radiograph 04/20/2013.  Findings: Dual lead pacer apparatus overlies the left hemi-thorax, leads are stable in position.  Stable cardiac and mediastinal contours.  Interval decrease in size of now small right pleural effusion with minimal linear opacities in the right lung base likely representing atelectasis.  No additional consolidative pulmonary opacities.  No definite pneumothorax.  Visualized bony skeleton is unremarkable.  IMPRESSION: Interval decrease in size of now small right pleural effusion with minimal right basilar atelectasis status post right thoracentesis. No definite right-sided pneumothorax.   Original Report Authenticated By: Annia Belt, M.D   Ct Chest W Contrast  04/21/2013   *RADIOLOGY REPORT*  Clinical Data: Status post cardiac ablation for atrial fibrillation with  recurrent right-sided pleural effusion and anemia.  Status post removal of 1.5 liters of right pleural fluid on 04/20/2013.  CT CHEST WITH CONTRAST  Technique:  Multidetector CT imaging of the chest was performed following the standard protocol during bolus administration of intravenous contrast.  Contrast: 80mL OMNIPAQUE IOHEXOL 300 MG/ML  SOLN  Comparison: Chest x-ray on 04/20/2013.  Findings: A dependent and relatively simple appearing moderate sized pleural effusion is present on the right causing compressive atelectasis of the right lower lobe.  Fluid density measurements are consistent with simple fluid.  There is no evidence of loculation or pleural enhancement.  No pleural masses are identified.  Lungs show no evidence of pulmonary edema or nodule.  The heart size is normal.  A pacemaker is in place and a left atrial appendage clip is also present.  There is some calcified atherosclerotic plaque noted in the distribution of the LAD.  The thoracic aorta is of normal caliber.  No enlarged lymph nodes are identified.  Bony structures show mild degenerative changes throughout the thoracic spine.  The visualized upper  abdomen shows a gastric band around the proximal stomach.  IMPRESSION:  1.  Residual moderate sized right pleural effusion which appears simple in density.  There is no evidence to suggest empyema, hemothorax or pleural tumor. 2.  Coronary atherosclerosis with calcified plaque noted in the distribution of the LAD.   Original Report Authenticated By: Irish Lack, M.D.   Dg Chest Port 1 View  04/23/2013   *RADIOLOGY REPORT*  Clinical Data: Follow up effusion  PORTABLE CHEST - 1 VIEW  Comparison: 04/22/2013  Findings: Small right pleural effusion, increased in size.  Mild right lower lobe atelectasis unchanged.  Left lung is clear.  No heart failure.  IMPRESSION: Mild increase in right pleural effusion.  Negative for edema   Original Report Authenticated By: Janeece Riggers, M.D.   US Thoracentesis  Asp Pleural Space W/img Guide  04/22/2013   *RADIOLOGY REPORT*  Clinical Data:  Residual right-sided pleural effusion.  ULTRASOUND GUIDED right THORACENTESIS  Comparison:  Previous thoracentesis  An ultrasound guided thoracentesis was thoroughly discussed with the patient and questions answered.  The benefits, risks, alternatives and complications were also discussed.  The patient understands and wishes to proceed with the procedure.  Written consent was obtained.  Ultrasound was performed to localize and mark an adequate pocket of fluid in the right chest.  The area was then prepped and draped in the normal sterile fashion.  1% Lidocaine was used for local anesthesia.  Under ultrasound guidance a 19 gauge Yueh catheter was introduced.  Thoracentesis was performed.  The catheter was removed and a dressing applied.  Complications:  None immediate  Findings: A total of approximately 1.7 liters of blood tinged fluid was removed. A fluid sample was not sent for laboratory analysis.  IMPRESSION: Successful ultrasound guided right thoracentesis yielding 1.7 liters of pleural fluid.  Read by Brayton El PA-C   Original Report Authenticated By: Tacey Ruiz, MD    Disposition:  01-Home or Self Care      Discharge Orders   Future Appointments Provider Department Dept Phone   04/26/2013 10:00 AM Julio Sicks, NP [facility] [phone]   04/29/2013 2:30 PM Coralyn Helling, MD [facility] [phone]   Future Orders Complete By Expires     Diet - low sodium heart healthy  As directed     Discharge instructions  As directed     Comments:      Call for new fever Call for new chest pain    Increase activity slowly  As directed         Medication List         amiodarone 400 MG tablet  Commonly known as:  PACERONE  Take 400 mg by mouth daily.     apixaban 5 MG Tabs tablet  Commonly known as:  ELIQUIS  Hold this for now. If your chest xray is unchanged on your follow up  appointment we will resume it.     diltiazem 120 MG 24 hr capsule  Commonly known as:  CARDIZEM CD  Take 1 capsule (120 mg total) by mouth 2 (two) times daily.     HYDROcodone-homatropine 5-1.5 MG/5ML syrup  Commonly known as:  HYDROMET  Take 5 mLs by mouth every 6 (six) hours as needed for cough.     levothyroxine 200 MCG tablet  Commonly known as:  SYNTHROID, LEVOTHROID  Take 1 tablet (200 mcg total) by mouth daily before breakfast.     moxifloxacin 400 MG tablet  Commonly known  as:  AVELOX  Take 1 tablet (400 mg total) by mouth daily.     omeprazole 20 MG capsule  Commonly known as:  PRILOSEC  Take 20 mg by mouth every morning.     pravastatin 40 MG tablet  Commonly known as:  PRAVACHOL  Take 1 tablet (40 mg total) by mouth daily.     predniSONE 10 MG tablet  Commonly known as:  DELTASONE  Take 4 tabs  daily with food x 4 days, then 3 tabs daily x 4 days, then 2 tabs daily x 4 days, then 1 tab daily x4 days then stop. #40     Vitamin D3 1000 UNITS Caps  Take 1 capsule by mouth daily.       Follow-up Information   Follow up with PARRETT,TAMMY, NP On 04/26/2013. (report at 930 for Check in and CXR, then 10 for appointment )    Contact information:   [address] [place] [address] [phone]       Follow up with Coralyn Helling, MD On 04/29/2013. (230 )    Contact information:   [address] [place] [address] [phone]       Discharged Condition: good  Physician Statement:   The Patient was personally examined, the discharge assessment and plan has been personally reviewed and I agree with ACNP Babcock's assessment and plan. > 30 minutes of time have been dedicated to discharge assessment, planning and discharge instructions.      Sandrea Hughs, MD Pulmonary and Critical Care Medicine [facility] Cell [phone] After 5:30 PM or weekends, call [phone]

## 2013-04-22 NOTE — Procedures (Signed)
Successful US guided right thoracentesis. Yielded 1.7L of blood tinged fluid. Pt tolerated procedure well. No immediate complications.  Specimen was not sent for labs. CXR ordered.  Brayton El PA-C 04/22/2013 4:44 PM

## 2013-04-23 ENCOUNTER — Inpatient Hospital Stay (HOSPITAL_COMMUNITY): Payer: Medicare Other

## 2013-04-23 ENCOUNTER — Telehealth: Payer: Self-pay | Admitting: Pulmonary Disease

## 2013-04-23 DIAGNOSIS — E039 Hypothyroidism, unspecified: Secondary | ICD-10-CM | POA: Diagnosis not present

## 2013-04-23 DIAGNOSIS — J90 Pleural effusion, not elsewhere classified: Secondary | ICD-10-CM | POA: Diagnosis not present

## 2013-04-23 DIAGNOSIS — I4891 Unspecified atrial fibrillation: Secondary | ICD-10-CM | POA: Diagnosis not present

## 2013-04-23 LAB — BASIC METABOLIC PANEL
BUN: 20 mg/dL (ref 6–23)
CO2: 28 mEq/L (ref 19–32)
Calcium: 9.2 mg/dL (ref 8.4–10.5)
Creatinine, Ser: 0.83 mg/dL (ref 0.50–1.10)
GFR calc non Af Amer: 71 mL/min — ABNORMAL LOW (ref >90)
Glucose, Bld: 149 mg/dL — ABNORMAL HIGH (ref 70–99)

## 2013-04-23 LAB — CBC
MCH: 27.4 pg (ref 26.0–34.0)
MCHC: 30.7 g/dL (ref 30.0–36.0)
MCV: 89.1 fL (ref 78.0–100.0)
Platelets: 325 10*3/uL (ref 150–400)
RBC: 3.87 MIL/uL (ref 3.87–5.11)
RDW: 14.1 % (ref 11.5–15.5)

## 2013-04-23 MED ORDER — VITAMINS A & D EX OINT
TOPICAL_OINTMENT | CUTANEOUS | Status: AC
  Administered 2013-04-23: 11:00:00
  Filled 2013-04-23: qty 5

## 2013-04-23 MED ORDER — LEVOTHYROXINE SODIUM 200 MCG PO TABS: 200.0000 ug | ORAL_TABLET | Freq: Every day | ORAL | Status: DC

## 2013-04-23 MED ORDER — APIXABAN 5 MG PO TABS: ORAL_TABLET | ORAL | Status: DC

## 2013-04-23 MED ORDER — PREDNISONE 10 MG PO TABS: ORAL_TABLET | ORAL | Status: DC

## 2013-04-23 MED ORDER — MOXIFLOXACIN HCL 400 MG PO TABS: 400.0000 mg | ORAL_TABLET | Freq: Every day | ORAL | Status: DC

## 2013-04-23 MED ORDER — CEFUROXIME AXETIL 500 MG PO TABS: 500.0000 mg | ORAL_TABLET | Freq: Two times a day (BID) | ORAL | Status: DC

## 2013-04-23 NOTE — Telephone Encounter (Signed)
I have called and gave VO to the pharmacy. Nothing further was needed

## 2013-04-23 NOTE — Telephone Encounter (Signed)
I spoke with [facility]. Was advised pt is there now at the pharmacy. There is a flag coming up with drug to drug interaction in b/w avelox and amiodarone. Prolonged QT \. Please advise Dr. Delford Field since VS is not in the office thanks  Allergies  Allergen Reactions  . Tikosyn (Dofetilide)     Prolonged QT interval  . Xarelto (Rivaroxaban)     Nose Bleed X 6 hrs ; packing in ER  . Epinephrine Other (See Comments) and Rash    Dental form only (liquid). Patient stated she will become "out of it" she can hear you but cannot respond in normal fashion.  Marland Kitchen Ketamine     Hallucinations

## 2013-04-23 NOTE — Telephone Encounter (Signed)
D/c avelox Use Ceftin 500mg  bid x 7days

## 2013-04-24 LAB — BODY FLUID CULTURE: Culture: NO GROWTH

## 2013-04-26 ENCOUNTER — Encounter: Payer: Self-pay | Admitting: Adult Health

## 2013-04-26 ENCOUNTER — Ambulatory Visit (INDEPENDENT_AMBULATORY_CARE_PROVIDER_SITE_OTHER)
Admission: RE | Admit: 2013-04-26 | Discharge: 2013-04-26 | Disposition: A | Discharge status: Home / Self Care | Payer: Medicare Other | Source: Ambulatory Visit | Attending: Adult Health | Admitting: Adult Health

## 2013-04-26 ENCOUNTER — Ambulatory Visit (INDEPENDENT_AMBULATORY_CARE_PROVIDER_SITE_OTHER): Payer: Medicare Other | Admitting: Adult Health

## 2013-04-26 VITALS — BP 118/80 | HR 72 | Temp 99.2°F | Ht 65.5 in | Wt 213.8 lb

## 2013-04-26 DIAGNOSIS — J90 Pleural effusion, not elsewhere classified: Secondary | ICD-10-CM

## 2013-04-26 NOTE — Progress Notes (Signed)
Reviewed and agree with assessment/plan. 

## 2013-04-26 NOTE — Progress Notes (Signed)
Subjective:    Patient ID: Kayla Harrison, female    DOB: 09/30/1945, 67 y.o.   MRN: [id number]  HPI 67 yo female seen for pulmonary consult 04/16/13 for persistent pleural effusion   04/19/13  Acute OV  Complains of increased SOB, cough occasionally producing white frothy mucus, right lung pain - worse since last ov.  Thoracentesis scheduled for 7/31.  Seen for pulmonary consult 7/25 for persistent pleural effusion since 03/07/13 (s/p cardiac ablation for A Fib at [facility] on 6/10 ). Set up for thoracentesis on 7/31 but worse over weekend with more dyspnea, orthopnea and pleuritic pain.  CXR today shows  Increased large right effusion  Pt complains of orthopnea and increased cough.  >>Admitted   04/26/2013 Post Hospital follow up  Returns for post hospital follow up .  Admitted for worsening right pleural effusion on 7/28  Hx of  Chronic AF s/p recent convergent ablation procedure performed at [facility] 03/02/2013. Her convergent ablation procedure was complicated by recurrent rapid AF, right-sided pleural effusion, anemia, recurrent rapid AF and bradycardia with sick sinus syndrome requiring PPM implantation. She was started on Tikosyn -unable to tolerate d/t prolonged QT . Then restarted on Amiodarone. Notes from [facility] at discharge indicated she had a moderate right sided pleural effusion prior to discharge. Effusion did not resolve and she worsening with dyspnea and pain on right side with admission on 7/28.  She was admitted to the hospital. Placed on empiric antibiotics and her Eliquis was held. She underwent diagnostic and therapeutic thoracentesis on 7/29. These findings were most consistent with Parapneumonic process (note 60% polys in fuid) with low grade fever and elevated ESR.  post cardiac injury syndrome (Dressler's like )- though note the echo 7/8 & CT showed no pericardial effusion ,  Her cytology was negative. Her CXR still demonstrated effusion s/p drainage and she continued to have pleuritic  type chest pain so we repeated CT scan. This continued to show moderate sized right sided free flowing effusion, and RLL consolidation which could represent a PNA. There were no loculations. She underwent repeat thoracentesis on 7/31 with goals to drain dry. Another 1.7 liters of blood tinged fluid was removed. F/U CXR showed Very small pleural fluid with some residual atelectasis, volume a little worse in f/u film but still small. She was discharged to home with instructions to continue antibiotics, and continue steroid taper with follow up in our clinic for cxr. She was changed to ceftin post discharge d/t possible drug interation of Avelox.   Since discharge she is feeling better but still weak, has dry cough and pleuritic pain. No fevers. No edema or orthopnea.  CXR today shows small right pleural effusion slightly improved .    Review of Systems  Constitutional:   No  weight loss, night sweats,  Fevers, chills,  +fatigue, or  lassitude.  HEENT:   No headaches,  Difficulty swallowing,  Tooth/dental problems, or  Sore throat,                No sneezing, itching, ear ache, nasal congestion, post nasal drip,   CV:   Orthopnea,No  PND, swelling in lower extremities, NO anasarca, dizziness, palpitations, syncope.   GI  No heartburn, indigestion, abdominal pain, nausea, vomiting, diarrhea, change in bowel habits, loss of appetite, bloody stools.   Resp:   No coughing up of blood.  No change in color of mucus.  No wheezing.  No chest wall deformity  Skin: no rash or lesions.  GU: no dysuria, change in color of urine, no urgency or frequency.  No flank pain, no hematuria   MS:  No joint pain or swelling.  No decreased range of motion.  No back pain.  Psych:  No change in mood or affect. No depression or anxiety.  No memory loss.         Objective:   Physical Exam  GEN: A/Ox3; pleasant , NAD,obese   HEENT:  [place]/AT,  EACs-clear, TMs-wnl, NOSE-clear, THROAT-clear, no lesions, no  postnasal drip or exudate noted.   NECK:  Supple w/ fair ROM; no JVD; normal carotid impulses w/o bruits; no thyromegaly or nodules palpated; no lymphadenopathy.  RESP  Diminshed BS in bases R > L .no accessory muscle use, no dullness to percussion  CARD:  RRR, no m/r/g  , tr peripheral edema with venous insufficiency changes , pulses intact, no cyanosis or clubbing.  GI:   Soft & nt; nml bowel sounds; no organomegaly or masses detected.  Musco: Warm bil, no deformities or joint swelling noted.   Neuro: alert, no focal deficits noted.    Skin: Warm, no lesions or rashes         Assessment & Plan:

## 2013-04-26 NOTE — Patient Instructions (Addendum)
Finish Antibiotics and steroids  Advance activity as tolerated -no exercise for now  Warm heat to ribs as needed.  Follow up Dr. Craige Cotta  On Thursday this week as planned. - will repeat chest xray  Restart Eliquis tomorrow.  Please contact office for sooner follow up if symptoms do not improve or worsen or seek emergency care

## 2013-04-26 NOTE — Assessment & Plan Note (Signed)
Small right pleural effusion with no increase on today  CXR (4 days since last thoracentesis)  Will have her finish abx and steroids  Return as planned in 4 days with repeat cxr.  May need to repeat ESR once off steroids  May restart Eliquis tomorrow as effusion did not increase.

## 2013-04-28 ENCOUNTER — Inpatient Hospital Stay: Payer: Medicare Other | Admitting: Adult Health

## 2013-04-29 ENCOUNTER — Encounter: Payer: Self-pay | Admitting: Pulmonary Disease

## 2013-04-29 ENCOUNTER — Ambulatory Visit (INDEPENDENT_AMBULATORY_CARE_PROVIDER_SITE_OTHER): Payer: Medicare Other | Admitting: Pulmonary Disease

## 2013-04-29 ENCOUNTER — Ambulatory Visit (INDEPENDENT_AMBULATORY_CARE_PROVIDER_SITE_OTHER)
Admission: RE | Admit: 2013-04-29 | Discharge: 2013-04-29 | Disposition: A | Discharge status: Home / Self Care | Payer: Medicare Other | Source: Ambulatory Visit | Attending: Pulmonary Disease | Admitting: Pulmonary Disease

## 2013-04-29 VITALS — BP 100/70 | HR 80 | Temp 97.8°F | Ht 65.5 in | Wt 210.0 lb

## 2013-04-29 DIAGNOSIS — J90 Pleural effusion, not elsewhere classified: Secondary | ICD-10-CM

## 2013-04-29 NOTE — Patient Instructions (Signed)
Follow up in 3 weeks with Chest xray >> can see Dr. Craige Cotta or Tammy Parrett

## 2013-04-29 NOTE — Progress Notes (Signed)
Chief Complaint  Patient presents with  . Follow-up    2 week    History of Present Illness: Kayla Harrison is a 67 y.o. female with pleural effusion after having epicardial ablation via transabdominal approach with endocardial ablation for a fib at [facility] on March 02, 2013.  Concern for parapneumonic effusion versus Dressler syndrome.  She is feeling better.  She not longer has chest pain.  She is feeling fatigued more easily.  She is needing to nap in the afternoon now.  She is not having cough.  She has noticed nasal congestion and throat tickle >> thinks this is her allergies.  She denies fever, cough, or sputum.   TESTS: ESR 04/19/13 >> 62 Rt thoracentesis 04/19/13 >> 1.5 L, glucose 129, protein 4.2, LDH 185, WBC 2565 (61N, 29L, 1M, 2E), ADA 9.6, mixed acute/chronic inflammation CT chest 04/21/13 >> mod Rt effusion Rt thoracentesis 04/22/13 >> 1.7 L, no fluid analysis  Kayla Harrison  has a past medical history of Morbid obesity; thyroid cancer; Colon polyp; Dyslipidemia; Hyperlipidemia; Hypothyroidism; Breast cancer; Pneumonia; Arthritis; Normal cardiac stress test; Stroke; Atrial fibrillation; Complication of anesthesia; and Hepatitis.  Kayla Harrison  has past surgical history that includes Thyroidectomy (1998); bso (1998); Cholecystectomy; Tonsillectomy; Colonoscopy w/ polypectomy; afib ablation; Abdominal hysterectomy (1983); Breast lumpectomy (2010); Laparoscopic gastric banding (07/10/2010); Appendectomy; Knee arthroscopy; laparotomy; Total knee arthroplasty (04/13/2012); Joint replacement (04/13/12); Cardioversion (10/09/2012); Cardioversion (10/09/2012); Cardioversion (N/A, 11/20/2012); Transabdominal ablation (03/02/13); and Pacemaker insertion (03/10/13).  Prior to Admission medications   Medication Sig Start Date End Date Taking? Authorizing Provider  amiodarone (PACERONE) 400 MG tablet Take 400 mg by mouth daily.    Historical Provider, MD  apixaban (ELIQUIS)  5 MG TABS tablet Hold this for now. If your chest xray is unchanged on your follow up appointment we will resume it. 04/23/13   Simonne Martinet, NP  cefUROXime (CEFTIN) 500 MG tablet Take 1 tablet (500 mg total) by mouth 2 (two) times daily. 04/23/13   Storm Frisk, MD  Cholecalciferol (VITAMIN D3) 1000 UNITS CAPS Take 1 capsule by mouth daily.     Historical Provider, MD  diltiazem (CARDIZEM CD) 120 MG 24 hr capsule Take 1 capsule (120 mg total) by mouth 2 (two) times daily. 11/20/12   Duke Salvia, MD  levothyroxine (SYNTHROID, LEVOTHROID) 200 MCG tablet Take 1 tablet (200 mcg total) by mouth daily before breakfast. 04/23/13   Simonne Martinet, NP  omeprazole (PRILOSEC) 20 MG capsule Take 20 mg by mouth every morning.    Historical Provider, MD  pravastatin (PRAVACHOL) 40 MG tablet Take 1 tablet (40 mg total) by mouth daily. 03/01/13   Pecola Lawless, MD  predniSONE (DELTASONE) 10 MG tablet Take 4 tabs  daily with food x 4 days, then 3 tabs daily x 4 days, then 2 tabs daily x 4 days, then 1 tab daily x4 days then stop. #40 04/23/13   Simonne Martinet, NP    Allergies  Allergen Reactions  . Tikosyn (Dofetilide)     Prolonged QT interval  . Xarelto (Rivaroxaban)     Nose Bleed X 6 hrs ; packing in ER  . Epinephrine Other (See Comments) and Rash    Dental form only (liquid). Patient stated she will become "out of it" she can hear you but cannot respond in normal fashion.  Marland Kitchen Ketamine     Hallucinations     Physical Exam:  General - No distress  ENT - No sinus tenderness, no oral exudate, no LAN Cardiac - s1s2 regular, no murmur Chest - decrease breath sound Rt base, no wheeze/rales Back - No focal tenderness Abd - Soft, non-tender Ext - No edema Neuro - Normal strength Skin - No rashes Psych - normal mood, and behavior   Dg Chest 2 View  04/29/2013   *RADIOLOGY REPORT*  Clinical Data: Follow up pleural effusion, cough  CHEST - 2 VIEW  Comparison: 04/26/2013  Findings: Cardiomediastinal  silhouette is stable.  Dual lead cardiac pacemaker is unchanged in position.  Persistent small partially loculated right pleural effusion with right basilar anterior atelectasis or infiltrate.  Mild degenerative changes thoracic spine.  No pulmonary edema.  IMPRESSION: Persistent small partially loculated right pleural effusion with right basilar anterior atelectasis or infiltrate.  Mild degenerative changes thoracic spine.  No pulmonary edema.   Original Report Authenticated By: Natasha Mead, M.D.      Assessment/Plan:  Coralyn Helling, MD [place] [facility] Pager:  [phone]

## 2013-04-29 NOTE — Assessment & Plan Note (Signed)
Main concern has been parapneumonic versus Dressler's syndrome.  Clinical improved.  She still has small effusion, but not enough to warrant any additional interventions at this time.  She is to finish antibiotics and continue prednisone taper.  Advised she could resume her exercise regimen, but to start at a slow pace and then gradually increase activity level.  Will repeat chest xray at next follow up.

## 2013-04-30 LAB — RHEUMATOID FACTORS, FLUID: Cortisol #1 (Base): NEGATIVE

## 2013-05-07 DIAGNOSIS — J90 Pleural effusion, not elsewhere classified: Secondary | ICD-10-CM | POA: Diagnosis not present

## 2013-05-07 DIAGNOSIS — I509 Heart failure, unspecified: Secondary | ICD-10-CM | POA: Diagnosis not present

## 2013-05-07 DIAGNOSIS — J189 Pneumonia, unspecified organism: Secondary | ICD-10-CM | POA: Diagnosis not present

## 2013-05-07 DIAGNOSIS — I459 Conduction disorder, unspecified: Secondary | ICD-10-CM | POA: Diagnosis not present

## 2013-05-07 DIAGNOSIS — Z79899 Other long term (current) drug therapy: Secondary | ICD-10-CM | POA: Diagnosis not present

## 2013-05-07 DIAGNOSIS — E039 Hypothyroidism, unspecified: Secondary | ICD-10-CM | POA: Diagnosis not present

## 2013-05-07 DIAGNOSIS — I4891 Unspecified atrial fibrillation: Secondary | ICD-10-CM | POA: Diagnosis not present

## 2013-05-07 DIAGNOSIS — Z950 Presence of cardiac pacemaker: Secondary | ICD-10-CM | POA: Diagnosis not present

## 2013-05-10 ENCOUNTER — Encounter: Payer: Self-pay | Admitting: Cardiology

## 2013-05-10 ENCOUNTER — Encounter: Payer: Self-pay | Admitting: Adult Health

## 2013-05-10 DIAGNOSIS — J90 Pleural effusion, not elsewhere classified: Secondary | ICD-10-CM

## 2013-05-11 ENCOUNTER — Encounter: Payer: Self-pay | Admitting: *Deleted

## 2013-05-11 NOTE — Telephone Encounter (Signed)
Spoke with TP RA with no openings in [facility] office today and not in [place] this week MW with openings today and tomorrow 8.20.14 Called spoke with patient, advised of the above.  Patient reports she is unable to come today for ov >> appt scheduled with MW tomorrow 8.20.14 @ 1130 with cxr prior per pt's request; pt to arrive early for this.  CXR order placed STAT.  Nothing further at needed at this time.  Will sign off.

## 2013-05-12 ENCOUNTER — Ambulatory Visit (INDEPENDENT_AMBULATORY_CARE_PROVIDER_SITE_OTHER)
Admission: RE | Admit: 2013-05-12 | Discharge: 2013-05-12 | Disposition: A | Discharge status: Home / Self Care | Payer: Medicare Other | Source: Ambulatory Visit | Attending: Internal Medicine | Admitting: Internal Medicine

## 2013-05-12 ENCOUNTER — Ambulatory Visit (INDEPENDENT_AMBULATORY_CARE_PROVIDER_SITE_OTHER): Payer: Medicare Other | Admitting: Internal Medicine

## 2013-05-12 ENCOUNTER — Encounter: Payer: Self-pay | Admitting: Internal Medicine

## 2013-05-12 ENCOUNTER — Other Ambulatory Visit (INDEPENDENT_AMBULATORY_CARE_PROVIDER_SITE_OTHER): Payer: Medicare Other

## 2013-05-12 VITALS — BP 112/74 | HR 82 | Temp 98.2°F | Ht 65.5 in | Wt 210.0 lb

## 2013-05-12 DIAGNOSIS — R0609 Other forms of dyspnea: Secondary | ICD-10-CM | POA: Diagnosis not present

## 2013-05-12 DIAGNOSIS — R0600 Dyspnea, unspecified: Secondary | ICD-10-CM

## 2013-05-12 DIAGNOSIS — J90 Pleural effusion, not elsewhere classified: Secondary | ICD-10-CM

## 2013-05-12 LAB — SEDIMENTATION RATE: Sed Rate: 53 mm/hr — ABNORMAL HIGH (ref 0–22)

## 2013-05-12 LAB — CBC WITH DIFFERENTIAL/PLATELET
Basophils Relative: 0.2 % (ref 0.0–3.0)
Eosinophils Relative: 2.7 % (ref 0.0–5.0)
HCT: 38 % (ref 36.0–46.0)
Monocytes Relative: 6 % (ref 3.0–12.0)
Neutrophils Relative %: 80.6 % — ABNORMAL HIGH (ref 43.0–77.0)
Platelets: 181 10*3/uL (ref 150.0–400.0)
RBC: 4.48 Mil/uL (ref 3.87–5.11)
WBC: 10.7 10*3/uL — ABNORMAL HIGH (ref 4.5–10.5)

## 2013-05-12 LAB — BASIC METABOLIC PANEL
BUN: 21 mg/dL (ref 6–23)
CO2: 29 mEq/L (ref 19–32)
Glucose, Bld: 109 mg/dL — ABNORMAL HIGH (ref 70–99)
Potassium: 4.3 mEq/L (ref 3.5–5.1)
Sodium: 140 mEq/L (ref 135–145)

## 2013-05-12 MED ORDER — MELOXICAM 7.5 MG PO TABS: ORAL_TABLET | ORAL | Status: DC

## 2013-05-12 NOTE — Progress Notes (Signed)
Chief Complaint  Patient presents with  . Follow-up    2 week    History of Present Illness: Kayla Harrison is a 67 y.o. female with pleural effusion after having epicardial ablation via transabdominal approach with endocardial ablation for a fib at [facility] on March 02, 2013.  Concern for parapneumonic effusion versus Dressler syndrome.  She is feeling better.  She not longer has chest pain.  She is feeling fatigued more easily.  She is needing to nap in the afternoon now.  She is not having cough.  She has noticed nasal congestion and throat tickle >> thinks this is her allergies.  She denies fever, cough, or sputum.   TESTS: ESR 04/19/13 >> 62 Rt thoracentesis 04/19/13 >> 1.5 L, glucose 129, protein 4.2, LDH 185, WBC 2565 (61N, 29L, 53M, 2E), ADA 9.6, mixed acute/chronic inflammation CT chest 04/21/13 >> mod Rt effusion Rt thoracentesis 04/22/13 >> 1.7 L, no fluid analysis  05/12/2013 f/u ov/Wert f/u effusion off prednisone effective 8/17  Chief Complaint  Patient presents with  . Acute Visit    Still having SOB. Onset was 1 week ago. Had CXR today. Reports chest pain when breathing deep.   only time felt really better was sev days after tap. Only time she has cp is supine with deep breath. Doe x 50 ft.   No obvious daytime variabilty or assoc chronic cough or cp or chest tightness, subjective wheeze overt sinus or hb symptoms. No unusual exp hx or h/o childhood pna/ asthma or knowledge of premature birth.  Sleeping ok without nocturnal  or early am exacerbation  of respiratory  c/o's or need for noct saba. Also denies any obvious fluctuation of symptoms with weather or environmental changes or other aggravating or alleviating factors except as outlined above   Current Medications, Allergies, Past Medical History, Past Surgical History, Family History, and Social History were reviewed in [employer] record.  ROS  The following are not active complaints  unless bolded sore throat, dysphagia, dental problems, itching, sneezing,  nasal congestion or excess/ purulent secretions, ear ache,   fever, chills, sweats, unintended wt loss, pleuritic or exertional cp, hemoptysis,  orthopnea pnd or leg swelling, presyncope, palpitations, heartburn, abdominal pain, anorexia, nausea, vomiting, diarrhea  or change in bowel or urinary habits, change in stools or urine, dysuria,hematuria,  rash, arthralgias, visual complaints, headache, numbness weakness or ataxia or problems with walking or coordination,  change in mood/affect or memory.          Physical Exam:  General - No distress ENT - No sinus tenderness, no oral exudate, no LAN Cardiac - s1s2 regular, no murmur Chest - very min decrease breath sounds Rt base, no wheeze/rales Back - No focal tenderness Abd - Soft, non-tender Ext - No edema Neuro - Normal strength Skin - No rashes Psych - normal mood, and behavior    CXR  05/12/2013 : Decreased small right pleural effusion, trace residual. No new  cardiopulmonary abnormality.

## 2013-05-12 NOTE — Patient Instructions (Addendum)
For meloxicam 7.5 mg every 12 hours as needed for chest pain if not responding to tylenol  Please remember to go to the lab department downstairs for your tests - we will call you with the results when they are available.     Plan on follow up with Dr Alwyn Ren in a couple of weeks.

## 2013-05-12 NOTE — Assessment & Plan Note (Signed)
R thoracentesis 04/20/13 >  Exudative, P > L, nl pH and cyt neg Steroids started 7/30 with ESR = 62 Repeat R thoracentesis 04/22/13 Completed steroids 05/09/13 >  cxr with min residual 05/12/2013 so rx with prn meloxicam since on [facility]

## 2013-05-12 NOTE — Assessment & Plan Note (Signed)
-  05/12/2013  Walked RA x 2 laps @ 185 ft each stopped due to dizziness, no desat or even tach  Not presently limited by breathing. Nothing else to offer at this point x for reconditioning > defer to Dr Craige Cotta

## 2013-05-13 ENCOUNTER — Telehealth: Payer: Self-pay | Admitting: Internal Medicine

## 2013-05-13 NOTE — Telephone Encounter (Signed)
Pt aware of results.  Pt wanting detailed results of some labs.  Nothing further needed.

## 2013-05-27 ENCOUNTER — Ambulatory Visit: Payer: Medicare Other | Admitting: Adult Health

## 2013-06-02 LAB — AFB CULTURE WITH SMEAR (NOT AT ARMC): Acid Fast Smear: NONE SEEN

## 2013-06-10 DIAGNOSIS — I319 Disease of pericardium, unspecified: Secondary | ICD-10-CM | POA: Diagnosis not present

## 2013-06-10 DIAGNOSIS — E349 Endocrine disorder, unspecified: Secondary | ICD-10-CM | POA: Diagnosis not present

## 2013-06-10 DIAGNOSIS — E785 Hyperlipidemia, unspecified: Secondary | ICD-10-CM | POA: Diagnosis not present

## 2013-06-10 DIAGNOSIS — C50919 Malignant neoplasm of unspecified site of unspecified female breast: Secondary | ICD-10-CM | POA: Diagnosis not present

## 2013-06-10 DIAGNOSIS — R300 Dysuria: Secondary | ICD-10-CM | POA: Diagnosis not present

## 2013-06-10 DIAGNOSIS — R339 Retention of urine, unspecified: Secondary | ICD-10-CM | POA: Diagnosis not present

## 2013-06-10 DIAGNOSIS — N200 Calculus of kidney: Secondary | ICD-10-CM | POA: Diagnosis not present

## 2013-06-10 DIAGNOSIS — J90 Pleural effusion, not elsewhere classified: Secondary | ICD-10-CM | POA: Diagnosis not present

## 2013-06-10 DIAGNOSIS — I4891 Unspecified atrial fibrillation: Secondary | ICD-10-CM | POA: Diagnosis not present

## 2013-06-10 DIAGNOSIS — C73 Malignant neoplasm of thyroid gland: Secondary | ICD-10-CM | POA: Diagnosis not present

## 2013-06-15 DIAGNOSIS — R310 Gross hematuria: Secondary | ICD-10-CM | POA: Diagnosis not present

## 2013-06-15 DIAGNOSIS — N200 Calculus of kidney: Secondary | ICD-10-CM | POA: Diagnosis not present

## 2013-06-28 ENCOUNTER — Other Ambulatory Visit: Payer: Self-pay | Admitting: Internal Medicine

## 2013-06-28 DIAGNOSIS — Z853 Personal history of malignant neoplasm of breast: Secondary | ICD-10-CM

## 2013-06-28 DIAGNOSIS — Z9889 Other specified postprocedural states: Secondary | ICD-10-CM

## 2013-07-01 ENCOUNTER — Encounter (INDEPENDENT_AMBULATORY_CARE_PROVIDER_SITE_OTHER): Payer: Self-pay

## 2013-07-01 ENCOUNTER — Ambulatory Visit (INDEPENDENT_AMBULATORY_CARE_PROVIDER_SITE_OTHER): Payer: Medicare Other | Admitting: Physician Assistant

## 2013-07-01 VITALS — BP 118/78 | HR 64 | Resp 16 | Ht 65.5 in | Wt 218.6 lb

## 2013-07-01 DIAGNOSIS — Z4651 Encounter for fitting and adjustment of gastric lap band: Secondary | ICD-10-CM | POA: Diagnosis not present

## 2013-07-01 NOTE — Progress Notes (Signed)
  HISTORY: Kayla Harrison is a 67 y.o.female who received an AP-Standard lap-band in October 2011 by Dr. Daphine Deutscher. She comes in having last been seen in June when i removed all fluid in anticipation of a convergence procedure at [facility]. Her hospital course was rather rocky, with a diagnosis of sick sinus syndrome, placement of a pacemaker and a pleural effusion. Fortunately, she's much better now, and is participating in water aerobics as well as going to the gym. She has lost weight due to her hospital course but she's gaining again. She has hunger and the ability to eat more than desired. She'd like to get back on track with the band.  VITAL SIGNS: Filed Vitals:   07/01/13 1419  BP: 118/78  Pulse: 64  Resp: 16    PHYSICAL EXAM: Physical exam reveals a very well-appearing 67 y.o.female in no apparent distress Neurologic: Awake, alert, oriented Psych: Bright affect, conversant Respiratory: Breathing even and unlabored. No stridor or wheezing Abdomen: Soft, nontender, nondistended to palpation. Incisions well-healed. No incisional hernias. Port easily palpated. Extremities: Atraumatic, good range of motion.  ASSESMENT: 67 y.o.  female  s/p AP-Standard lap-band.   PLAN: The patient's port was accessed with a 20G Huber needle without difficulty. Clear fluid was aspirated and 3 mL saline was added to the port to give a total predicted volume of 3 mL. The patient was able to swallow water without difficulty following the procedure and was instructed to take clear liquids for the next 24-48 hours and advance slowly as tolerated.

## 2013-07-01 NOTE — Patient Instructions (Signed)
Take clear liquids for the remainder of the day. Thin protein shakes are ok to start this evening for your evening meal. Have one also for breakfast tomorrow morning. Try yogurt, cottage cheese or foods of similar consistency for lunch tomorrow. Slowly and carefully advance your diet thereafter. Call us if you have persistent vomiting or regurgitation, night cough or reflux symptoms. Return as scheduled or sooner if you notice no changes in hunger/portion sizes.  

## 2013-07-03 ENCOUNTER — Other Ambulatory Visit: Payer: Self-pay | Admitting: Internal Medicine

## 2013-07-14 ENCOUNTER — Telehealth: Payer: Self-pay | Admitting: Internal Medicine

## 2013-07-14 NOTE — Telephone Encounter (Signed)
New Problem  Pt called and ask's is it possible to change from Eliquis back to coumadin because she is in the Donut hole and the RX is expensive// please advise

## 2013-07-15 ENCOUNTER — Encounter: Payer: Self-pay | Admitting: Cardiology

## 2013-07-15 NOTE — Telephone Encounter (Signed)
Explained to patient that I would forward information to Weston Brass, our pharmacist, for dosing of Coumadin. Ok to start back on that per Dr. Graciela Husbands. Patient agreeable to plan.

## 2013-07-22 ENCOUNTER — Ambulatory Visit (INDEPENDENT_AMBULATORY_CARE_PROVIDER_SITE_OTHER): Payer: Medicare Other | Admitting: Internal Medicine

## 2013-07-22 ENCOUNTER — Encounter: Payer: Self-pay | Admitting: Internal Medicine

## 2013-07-22 ENCOUNTER — Telehealth: Payer: Self-pay | Admitting: Internal Medicine

## 2013-07-22 ENCOUNTER — Ambulatory Visit (INDEPENDENT_AMBULATORY_CARE_PROVIDER_SITE_OTHER): Payer: Medicare Other | Admitting: General Practice

## 2013-07-22 VITALS — BP 106/84 | HR 71 | Ht 66.0 in | Wt 222.0 lb

## 2013-07-22 DIAGNOSIS — I5032 Chronic diastolic (congestive) heart failure: Secondary | ICD-10-CM | POA: Diagnosis not present

## 2013-07-22 DIAGNOSIS — Z950 Presence of cardiac pacemaker: Secondary | ICD-10-CM | POA: Diagnosis not present

## 2013-07-22 DIAGNOSIS — I6350 Cerebral infarction due to unspecified occlusion or stenosis of unspecified cerebral artery: Secondary | ICD-10-CM | POA: Diagnosis not present

## 2013-07-22 DIAGNOSIS — Z7901 Long term (current) use of anticoagulants: Secondary | ICD-10-CM

## 2013-07-22 DIAGNOSIS — I4891 Unspecified atrial fibrillation: Secondary | ICD-10-CM | POA: Diagnosis not present

## 2013-07-22 DIAGNOSIS — J90 Pleural effusion, not elsewhere classified: Secondary | ICD-10-CM

## 2013-07-22 LAB — PACEMAKER DEVICE OBSERVATION
AL AMPLITUDE: 1.6 mv
BAMS-0001: 170 {beats}/min
RV LEAD AMPLITUDE: 14.6 mv
RV LEAD IMPEDENCE PM: 494 Ohm
RV LEAD THRESHOLD: 1 V
VENTRICULAR PACING PM: 0.03

## 2013-07-22 LAB — POCT INR: INR: 1.3

## 2013-07-22 MED ORDER — WARFARIN SODIUM 5 MG PO TABS: ORAL_TABLET | ORAL | Status: DC

## 2013-07-22 NOTE — Telephone Encounter (Signed)
New problem:  Pt states Sherri called her earlier this morning and pt is returning her call

## 2013-07-22 NOTE — Progress Notes (Signed)
Patient Care Team: Pecola Lawless, MD as PCP - General   HPI  Kayla Harrison Hives is a 67 y.o. female Seen in followup for atrial fibrillation for which she underwent pulmonary vein isolation at [facility] 2009  She has a history of prior strokes and remains on oral anticoagulation .  Echo 2011 EF 55% with mod LAE-16mm    She also has a history of morbid obesity status post lap band surgery    She's had problems with recurrent atrial fibrillation.  She underwent convergent ablation at [facility] 6/14 it was complicated by heart block prompting pacemaker implantation. He was further complicated by right pleural effusion and pneumonia requiring thoracentesis. This was missed for some months by multiple physicians including myself.    LV function was normal  She has a number of concerns. The first is that she continues to not regain endurance. She was told at [facility] it might take 6-8 months.  She also notes that she feels her heart beating when she lies on her left side. Echocardiogram post ablation demonstrated normal left ventricular function and without effusion.   Past Medical History  Diagnosis Date  . Morbid obesity     Status post lap band surgery  . Hx of thyroid cancer     Dr Evlyn Kanner  . Colon polyp     Dr Kinnie Scales  . Dyslipidemia   . Hyperlipidemia   . Hypothyroidism   . Breast cancer     Dr Jamey Ripa, total thyroidectomy- 1999- for cancer  . Pneumonia     2010- hosp. here [facility]- during that event had to be cardioverted   . Arthritis     osteoarthritis - knees   . Normal cardiac stress test     2008- cardiac stress/echo  . Stroke     2003- [place]   . Atrial fibrillation     ablation- 2x's-- 1st time- [facility], 2nd event at [facility] in 2008. Dr. Graciela Husbands- [place]- sees pt. once /yr. ,  . Complication of anesthesia     Ketamine produces LSD reaction, bright colored nightmarish experience   . Hepatitis     [place]itis     Past  Surgical History  Procedure Laterality Date  . Thyroidectomy  1998    Dr Jamey Ripa  . Bso  1998  . Cholecystectomy    . Tonsillectomy    . Colonoscopy w/ polypectomy      Dr Kinnie Scales  . Afib ablation      X 2; [facility] & Dr Graciela Husbands  . Abdominal hysterectomy  1983  . Breast lumpectomy  2010  . Laparoscopic gastric banding  07/10/2010  . Appendectomy    . Knee arthroscopy      bilateral  . Laparotomy      for ruptured ovary and ovarian artery   . Total knee arthroplasty  04/13/2012    Procedure: TOTAL KNEE ARTHROPLASTY;  Surgeon: Raymon Mutton, MD;  Location: [facility] OR;  Service: Orthopedics;  Laterality: Right;  . Joint replacement  04/13/12    Right Knee replacement  . Cardioversion  10/09/2012    Procedure: CARDIOVERSION;  Surgeon: Rollene Rotunda, MD;  Location: [facility] OR;  Service: Cardiovascular;  Laterality: N/A;  . Cardioversion  10/09/2012    Procedure: CARDIOVERSION;  Surgeon: Rollene Rotunda, MD;  Location: [facility] ENDOSCOPY;  Service: Cardiovascular;  Laterality: N/A;  Kathlene November gave the ok to add pt to the add on , but we must  check to find out if the can add pt on at [phone] ( [phone])  . Cardioversion N/A 11/20/2012    Procedure: CARDIOVERSION;  Surgeon: Pricilla Riffle, MD;  Location: [facility] ENDOSCOPY;  Service: Cardiovascular;  Laterality: N/A;  . Transabdominal ablation  03/02/13    [facility]  . Pacemaker insertion  03/10/13    [facility]  . Convergent      Current Outpatient Prescriptions  Medication Sig Dispense Refill  . amiodarone (PACERONE) 100 MG tablet Take 100 mg by mouth daily.      . Cholecalciferol (VITAMIN D3) 1000 UNITS CAPS Take 1 capsule by mouth daily.       Marland Kitchen levothyroxine (SYNTHROID, LEVOTHROID) 150 MCG tablet Take 150 mcg by mouth daily before breakfast.      . pravastatin (PRAVACHOL) 40 MG tablet Take 1 tablet (40 mg total) by mouth daily.  90 tablet  0  . warfarin (COUMADIN) 5 MG tablet Take as directed by Anticoagulation clinic  30 tablet  3   No current facility-administered medications  for this visit.    Allergies  Allergen Reactions  . Tikosyn [Dofetilide]     Prolonged QT interval Prolonged QT interval  . Xarelto [Rivaroxaban]     Nose Bleed X 6 hrs ; packing in ER  . Epinephrine Other (See Comments) and Rash    Dental form only (liquid). Patient stated she will become "out of it" she can hear you but cannot respond in normal fashion.  Marland Kitchen Ketamine     Hallucinations    Review of Systems negative except from HPI and PMH  Physical Exam BP 106/84  Pulse 71  Ht 5\' 6"  (1.676 m)  Wt 222 lb (100.699 kg)  BMI 35.85 kg/m2  SpO2 91% Well developed and well nourished in no acute distress HENT normal E scleral and icterus clear Neck Supple JVP flat; carotids brisk and full Clear to ausculation decreased breath sounds right base  Regular rate and rhythm, no murmurs gallops or rub Soft with active bowel sounds No clubbing cyanosis 1+ Edema Alert and oriented, grossly normal motor and sensory function Skin Warm and Dry    Assessment and  Plan

## 2013-07-22 NOTE — Assessment & Plan Note (Signed)
There has been interval improvement in functional status over the last couple of months since her last thoracentesis. It seems to have leveled off. I don't see any specific arrhythmia issue as to why this should be the case. Most recent assessment of LV function was earlier this summer demonstrating normal LV function; CT scan at the end of July demonstrated no effusion

## 2013-07-22 NOTE — Patient Instructions (Signed)
Remote monitoring is used to monitor your Pacemaker  from home. This monitoring reduces the number of office visits required to check your device to one time per year. It allows [place] to keep an eye on the functioning of your device to ensure it is working properly. You are scheduled for a device check from home on 10/25/2013. You may send your transmission at any time that day. If you have a wireless device, the transmission will be sent automatically. After your physician reviews your transmission, you will receive a postcard with your next transmission date.  Your physician wants you to follow-up in: 9 months with Dr. Graciela Husbands.  You will receive a reminder letter in the mail two months in advance. If you don't receive a letter, please call our office to schedule the follow-up appointment.

## 2013-07-22 NOTE — Assessment & Plan Note (Signed)
By examination there are decreased breath sounds in the right lung suggesting recurrent pleural effusion. She would like to wait until she sees Dr. Alwyn Ren prior to obtaining a chest x-ray

## 2013-07-22 NOTE — Assessment & Plan Note (Signed)
She remains on amiodarone. She has had very little recurrent atrial fibrillation as Graciela Husbands by her pacemaker. She is currently on warfarin had been switched from apixaban because of "donut hole".  We discussed atrial fibrillation her prior procedure future expectations for more than 30 minutes.

## 2013-07-23 ENCOUNTER — Other Ambulatory Visit: Payer: Self-pay | Admitting: Internal Medicine

## 2013-07-23 MED ORDER — PRAVASTATIN SODIUM 40 MG PO TABS: 40.0000 mg | ORAL_TABLET | Freq: Every day | ORAL | Status: DC

## 2013-07-23 NOTE — Telephone Encounter (Signed)
Verified patient knew when her home monitoring date was - she confirmed 10/25/13. Also discussed her last TSH level in July being high. She is following up with Dr. Alwyn Ren next week and will have them draw TSH  and send [place] a copy of the results. Patient agreeable to plan.

## 2013-07-23 NOTE — Telephone Encounter (Signed)
Pravastatin refilled.  

## 2013-07-23 NOTE — Telephone Encounter (Signed)
lmtcb

## 2013-07-26 ENCOUNTER — Other Ambulatory Visit (INDEPENDENT_AMBULATORY_CARE_PROVIDER_SITE_OTHER): Payer: Medicare Other

## 2013-07-26 ENCOUNTER — Encounter: Payer: Self-pay | Admitting: Internal Medicine

## 2013-07-26 ENCOUNTER — Ambulatory Visit (INDEPENDENT_AMBULATORY_CARE_PROVIDER_SITE_OTHER): Payer: Medicare Other | Admitting: Internal Medicine

## 2013-07-26 ENCOUNTER — Ambulatory Visit (INDEPENDENT_AMBULATORY_CARE_PROVIDER_SITE_OTHER)
Admission: RE | Admit: 2013-07-26 | Discharge: 2013-07-26 | Disposition: A | Discharge status: Home / Self Care | Payer: Medicare Other | Source: Ambulatory Visit | Attending: Internal Medicine | Admitting: Internal Medicine

## 2013-07-26 VITALS — BP 144/79 | HR 72 | Temp 98.1°F

## 2013-07-26 DIAGNOSIS — Z8709 Personal history of other diseases of the respiratory system: Secondary | ICD-10-CM | POA: Diagnosis not present

## 2013-07-26 DIAGNOSIS — R0989 Other specified symptoms and signs involving the circulatory and respiratory systems: Secondary | ICD-10-CM | POA: Diagnosis not present

## 2013-07-26 DIAGNOSIS — R0689 Other abnormalities of breathing: Secondary | ICD-10-CM

## 2013-07-26 DIAGNOSIS — R0600 Dyspnea, unspecified: Secondary | ICD-10-CM

## 2013-07-26 DIAGNOSIS — R0609 Other forms of dyspnea: Secondary | ICD-10-CM

## 2013-07-26 DIAGNOSIS — E785 Hyperlipidemia, unspecified: Secondary | ICD-10-CM | POA: Diagnosis not present

## 2013-07-26 DIAGNOSIS — T887XXA Unspecified adverse effect of drug or medicament, initial encounter: Secondary | ICD-10-CM | POA: Diagnosis not present

## 2013-07-26 DIAGNOSIS — R0602 Shortness of breath: Secondary | ICD-10-CM | POA: Diagnosis not present

## 2013-07-26 LAB — HEPATIC FUNCTION PANEL
ALT: 15 U/L (ref 0–35)
AST: 20 U/L (ref 0–37)
Albumin: 3.6 g/dL (ref 3.5–5.2)
Alkaline Phosphatase: 65 U/L (ref 39–117)
Bilirubin, Direct: 0.1 mg/dL (ref 0.0–0.3)
Total Bilirubin: 0.5 mg/dL (ref 0.3–1.2)
Total Protein: 6.9 g/dL (ref 6.0–8.3)

## 2013-07-26 LAB — LIPID PANEL
LDL Cholesterol: 116 mg/dL — ABNORMAL HIGH (ref 0–99)
Total CHOL/HDL Ratio: 4
Triglycerides: 162 mg/dL — ABNORMAL HIGH (ref 0.0–149.0)

## 2013-07-26 LAB — CK: Total CK: 52 U/L (ref 7–177)

## 2013-07-26 NOTE — Assessment & Plan Note (Signed)
The breath sounds suggest possible scarring post pleural effusion. Chest x-ray indicated. She is able to exercise @ a significant level without compromise

## 2013-07-26 NOTE — Patient Instructions (Signed)
Your next office appointment will be determined based upon review of your pending labs & x-rays. Those instructions will be transmitted to you through My Chart.  Please report any significant change in your symptoms. Order for x-rays entered into  the computer; these will be performed at [address] [facility]. across from [facility]. No appointment is necessary.

## 2013-07-26 NOTE — Assessment & Plan Note (Signed)
Lipids pending

## 2013-07-26 NOTE — Progress Notes (Signed)
  Subjective:    Patient ID: Kayla Harrison, female    DOB: 1946/04/07, 67 y.o.   MRN: [id number]  HPI  She describes mild orthopnea and dyspnea for approximately 2 weeks. Her Cardiologist question decreased breath sounds during his pacemaker check last week. He recommended followup.  There was no specific trigger for this such as a recent lower respiratory tract infection  She can be short of breath simply talking or with exertion. She is able to perform 45 minutes of water aerobics 3 times a week; 20 minutes of biking 3 times a week and 15 minutes on the treadmill 3 times a week in addition to using weights 20 min 3 X/week.  The dyspnea is described as white and not reminiscent of that associated with the pleural effusion last year for which 3200 cc of pleural effusion was removed as 1500 cc & 1700 cc on two separate dates.      Review of Systems She has no cough, sputum production, or wheezing. She also denies fever, chills, or sweats.  She does have some edema despite taking furosemide.    Objective:   Physical Exam General appearance:well nourished; no acute distress or increased work of breathing is present.  No  lymphadenopathy about the head, neck, or axilla noted.   Eyes: No conjunctival inflammation or lid edema is present. There is no scleral icterus.  Ears:  External ear exam shows no significant lesions or deformities.  Otoscopic examination reveals clear canals, tympanic membranes are intact bilaterally without bulging, retraction, inflammation or discharge.  Nose:  External nasal examination shows no deformity or inflammation. Nasal mucosa are pink and moist without lesions or exudates. No septal dislocation or deviation.No obstruction to airflow.   Oral exam: Dental hygiene is good; lips and gums are healthy appearing.There is no oropharyngeal erythema or exudate noted.   Neck:  No deformities, thyromegaly, masses, or tenderness noted.   Supple with full range of  motion without pain.   Heart:  Normal rate and regular rhythm. S1 and S2 normal without gallop,  click, rub or other extra sounds. Grade 1/6 systolic murmur  Lungs:Chest clear to auscultation except for mild rales & rub LLL > RLL; no wheezes or rhonchi .No increased work of breathing.    Extremities:  No cyanosis or clubbing  noted . 1/2 + edema   Skin: Warm & dry w/o jaundice or tenting.         Assessment & Plan:  See Current Assessment & Plan in Problem List under specific Diagnosis

## 2013-07-27 ENCOUNTER — Ambulatory Visit (INDEPENDENT_AMBULATORY_CARE_PROVIDER_SITE_OTHER): Payer: Medicare Other | Admitting: *Deleted

## 2013-07-27 DIAGNOSIS — Z7901 Long term (current) use of anticoagulants: Secondary | ICD-10-CM

## 2013-07-27 DIAGNOSIS — I4891 Unspecified atrial fibrillation: Secondary | ICD-10-CM

## 2013-07-27 DIAGNOSIS — I6350 Cerebral infarction due to unspecified occlusion or stenosis of unspecified cerebral artery: Secondary | ICD-10-CM | POA: Diagnosis not present

## 2013-07-29 ENCOUNTER — Other Ambulatory Visit: Payer: Self-pay

## 2013-07-29 ENCOUNTER — Encounter: Payer: Self-pay | Admitting: Internal Medicine

## 2013-08-05 ENCOUNTER — Encounter (INDEPENDENT_AMBULATORY_CARE_PROVIDER_SITE_OTHER): Payer: Medicare Other

## 2013-08-06 ENCOUNTER — Ambulatory Visit
Admission: RE | Admit: 2013-08-06 | Discharge: 2013-08-06 | Disposition: A | Discharge status: Home / Self Care | Payer: Medicare Other | Source: Ambulatory Visit | Attending: Internal Medicine | Admitting: Internal Medicine

## 2013-08-06 DIAGNOSIS — Z853 Personal history of malignant neoplasm of breast: Secondary | ICD-10-CM

## 2013-08-06 DIAGNOSIS — Z9889 Other specified postprocedural states: Secondary | ICD-10-CM

## 2013-08-06 DIAGNOSIS — R928 Other abnormal and inconclusive findings on diagnostic imaging of breast: Secondary | ICD-10-CM | POA: Diagnosis not present

## 2013-08-10 ENCOUNTER — Ambulatory Visit (INDEPENDENT_AMBULATORY_CARE_PROVIDER_SITE_OTHER): Payer: Medicare Other | Admitting: *Deleted

## 2013-08-10 DIAGNOSIS — I6350 Cerebral infarction due to unspecified occlusion or stenosis of unspecified cerebral artery: Secondary | ICD-10-CM | POA: Diagnosis not present

## 2013-08-10 DIAGNOSIS — Z7901 Long term (current) use of anticoagulants: Secondary | ICD-10-CM | POA: Diagnosis not present

## 2013-08-10 DIAGNOSIS — I4891 Unspecified atrial fibrillation: Secondary | ICD-10-CM | POA: Diagnosis not present

## 2013-08-12 ENCOUNTER — Encounter (INDEPENDENT_AMBULATORY_CARE_PROVIDER_SITE_OTHER): Payer: Self-pay

## 2013-08-12 ENCOUNTER — Ambulatory Visit (INDEPENDENT_AMBULATORY_CARE_PROVIDER_SITE_OTHER): Payer: Medicare Other | Admitting: Physician Assistant

## 2013-08-12 VITALS — BP 123/79 | HR 70 | Temp 96.7°F | Resp 14 | Ht 66.0 in | Wt 223.6 lb

## 2013-08-12 DIAGNOSIS — Z4651 Encounter for fitting and adjustment of gastric lap band: Secondary | ICD-10-CM | POA: Diagnosis not present

## 2013-08-12 NOTE — Patient Instructions (Signed)
Take clear liquids for the remainder of the day. Thin protein shakes are ok to start this evening for your evening meal. Have one also for breakfast tomorrow morning. Try yogurt, cottage cheese or foods of similar consistency for lunch tomorrow. Slowly and carefully advance your diet thereafter. Call us if you have persistent vomiting or regurgitation, night cough or reflux symptoms. Return as scheduled or sooner if you notice no changes in hunger/portion sizes.  

## 2013-08-12 NOTE — Progress Notes (Signed)
  HISTORY: Kayla Harrison is a 67 y.o.female who received an AP-Standard lap-band in October 2011 by Dr. Daphine Deutscher. She comes in with 5 lbs weight gain since her last visit when 3 mL fluid was placed in her empty band. She had undergone a cardiac procedure earlier in the summer for which the fluid was removed. She is back to exercising to her pre-operative level but she is complaining of hunger and larger portion sizes, as expected. She had 4.25 mL in the band when all fluid was removed. She has no obstructive symptoms.  VITAL SIGNS: Filed Vitals:   08/12/13 1328  BP: 123/79  Pulse: 70  Temp: 96.7 F (35.9 C)  Resp: 14    PHYSICAL EXAM: Physical exam reveals a very well-appearing 67 y.o.female in no apparent distress Neurologic: Awake, alert, oriented Psych: Bright affect, conversant Respiratory: Breathing even and unlabored. No stridor or wheezing Abdomen: Soft, nontender, nondistended to palpation. Incisions well-healed. No incisional hernias. Port easily palpated. Extremities: Atraumatic, good range of motion.  ASSESMENT: 67 y.o.  female  s/p AP-Standard lap-band.   PLAN: The patient's port was accessed with a 20G Huber needle without difficulty. Clear fluid was aspirated and 1 mL saline was added to the port to give a total predicted volume of 4 mL. The patient was able to swallow water without difficulty following the procedure and was instructed to take clear liquids for the next 24-48 hours and advance slowly as tolerated.

## 2013-08-31 ENCOUNTER — Ambulatory Visit (INDEPENDENT_AMBULATORY_CARE_PROVIDER_SITE_OTHER): Payer: Medicare Other | Admitting: *Deleted

## 2013-08-31 DIAGNOSIS — I4891 Unspecified atrial fibrillation: Secondary | ICD-10-CM | POA: Diagnosis not present

## 2013-08-31 DIAGNOSIS — Z7901 Long term (current) use of anticoagulants: Secondary | ICD-10-CM

## 2013-08-31 DIAGNOSIS — I6350 Cerebral infarction due to unspecified occlusion or stenosis of unspecified cerebral artery: Secondary | ICD-10-CM | POA: Diagnosis not present

## 2013-09-08 ENCOUNTER — Encounter: Payer: Self-pay | Admitting: Internal Medicine

## 2013-09-08 ENCOUNTER — Ambulatory Visit (INDEPENDENT_AMBULATORY_CARE_PROVIDER_SITE_OTHER): Payer: Medicare Other | Admitting: Internal Medicine

## 2013-09-08 VITALS — BP 112/69 | HR 81 | Temp 97.9°F | Wt 223.4 lb

## 2013-09-08 DIAGNOSIS — L299 Pruritus, unspecified: Secondary | ICD-10-CM

## 2013-09-08 MED ORDER — HYDROXYZINE HCL 10 MG PO TABS: 10.0000 mg | ORAL_TABLET | Freq: Four times a day (QID) | ORAL | Status: DC | PRN

## 2013-09-08 MED ORDER — PREDNISONE 20 MG PO TABS: 20.0000 mg | ORAL_TABLET | Freq: Two times a day (BID) | ORAL | Status: DC

## 2013-09-08 MED ORDER — RANITIDINE HCL 150 MG PO TABS: 150.0000 mg | ORAL_TABLET | Freq: Two times a day (BID) | ORAL | Status: DC

## 2013-09-08 NOTE — Progress Notes (Signed)
Subjective:    Patient ID: Kayla Harrison, female    DOB: 1946-02-19, 67 y.o.   MRN: [id number]  HPI   Symptoms began a couple months ago he has  itching and burning in the lower back, legs, and left lower quadrant.This is intense enough to impact sleep. Back symptoms have improved but in the last several weeks she's developed visible rash in the left temple, eyelids, and perinasal areas.  She can identify no specific trigger but questions whether this is related to her amiodarone. The thyroid supplement does have a blue color ; pravastatin green; & warfarin is pink.  Self-treated with Eucerin & non steroidal creams were of no benefit.  No new medications/antibiotics;no tick/insect/pet exposures. No recent travel. No new detergent, new clothing, or other topical exposure.  Her mother had hives    Review of Systems   Her exercise tolerance has increased but she is fatigued after the exercise. She has been  experiencing  fatigue since her cardiac surgery.She denies feeling ill, fever, chills, or sweats. She's had no oral lesions or blisters. There's been no angioedema symptoms of facial or tongue swelling. She's had no associated shortness of breath or wheezing.  He has noted some thinning of her hair.        Objective:   Physical Exam Gen.: adequately nourished in appearance. Alert, appropriate and cooperative throughout exam.  Head: Normocephalic without obvious abnormalities  Eyes: No corneal or conjunctival inflammation noted. Pupils equal round reactive to light and accommodation.  Ears: External  ear exam reveals no significant lesions or deformities. Canals with some wax bilaterally Nose: External nasal exam reveals no deformity or inflammation. Nasal mucosa are pink and moist. No lesions or exudates noted.  Mouth: Oral mucosa and oropharynx reveal no lesions or exudates. Teeth in good repair. Neck: No deformities, masses, or tenderness noted.  Thyroid normal. Lungs:  Normal respiratory effort; chest expands symmetrically. Lungs are clear to auscultation without rales, wheezes, or increased work of breathing. Heart: Normal rate and rhythm. Normal S1 and S2. No gallop, click, or rub. S 4 w/o  murmur. Abdomen: Bowel sounds normal; abdomen soft and nontender. No masses, organomegaly or hernias noted.                                 Musculoskeletal/extremities:  No clubbing, cyanosis,  or significant extremity  deformity noted. Trace sockline edema.Range of motion normal .Tone & strength normal. Hand joints normal . Fingernail  health good. Able to lie down & sit up w/o help.  Vascular: Carotid, radial artery, dorsalis pedis and  posterior tibial pulses are full and equal. No bruits present. Neurologic: Alert and oriented x3.  Skin: Bland faint hyperpigmentation left temple & the left upper lid. Faint dermatographia present Lymph: No cervical, axillary lymphadenopathy present. Psych: Mood and affect are normal. Normally interactive                                                                                        Assessment & Plan:  #1 pruritic dermatitis; this has resolved over the lumbosacral  area but she has lid and temple lesions. Faint dermatographia is elicitable. This is most likely due to ingestion of food or medication. Any of the pigmented medications could be suspect.  Plan: See orders

## 2013-09-08 NOTE — Patient Instructions (Signed)
Avoid perfumes and cosmetics which are not hypoallergenic. Restrict hyperallergenic foods at this time: Nuts, strawberries, shellfish , chocolate, and tomatoes.

## 2013-09-08 NOTE — Progress Notes (Signed)
Pre visit review using our clinic review tool, if applicable. No additional management support is needed unless otherwise documented below in the visit note. 

## 2013-09-28 ENCOUNTER — Ambulatory Visit (INDEPENDENT_AMBULATORY_CARE_PROVIDER_SITE_OTHER): Payer: Medicare Other | Admitting: Pharmacist

## 2013-09-28 DIAGNOSIS — I4891 Unspecified atrial fibrillation: Secondary | ICD-10-CM

## 2013-09-28 DIAGNOSIS — I6350 Cerebral infarction due to unspecified occlusion or stenosis of unspecified cerebral artery: Secondary | ICD-10-CM | POA: Diagnosis not present

## 2013-09-28 DIAGNOSIS — Z7901 Long term (current) use of anticoagulants: Secondary | ICD-10-CM

## 2013-09-28 LAB — POCT INR: INR: 2

## 2013-09-30 ENCOUNTER — Encounter (INDEPENDENT_AMBULATORY_CARE_PROVIDER_SITE_OTHER): Payer: Self-pay

## 2013-09-30 ENCOUNTER — Ambulatory Visit (INDEPENDENT_AMBULATORY_CARE_PROVIDER_SITE_OTHER): Payer: Medicare Other | Admitting: Physician Assistant

## 2013-09-30 VITALS — BP 92/60 | HR 72 | Temp 98.6°F | Resp 14 | Ht 65.5 in | Wt 230.0 lb

## 2013-09-30 DIAGNOSIS — Z4651 Encounter for fitting and adjustment of gastric lap band: Secondary | ICD-10-CM | POA: Diagnosis not present

## 2013-09-30 NOTE — Patient Instructions (Signed)
Take clear liquids for the remainder of the day. Thin protein shakes are ok to start this evening for your evening meal. Have one also for breakfast tomorrow morning. Try yogurt, cottage cheese or foods of similar consistency for lunch tomorrow. Slowly and carefully advance your diet thereafter. Call us if you have persistent vomiting or regurgitation, night cough or reflux symptoms. Return as scheduled or sooner if you notice no changes in hunger/portion sizes.  

## 2013-09-30 NOTE — Progress Notes (Signed)
  HISTORY: Kayla Harrison is a 68 y.o.female who received an AP-Standard lap-band in October 2011 by Dr. Hassell Done. She's doing well except for increased hunger and portion sizes. She denies regurgitation or reflux symptoms.  VITAL SIGNS: Filed Vitals:   09/30/13 1428  BP: 92/60  Pulse: 72  Temp: 98.6 F (37 C)  Resp: 14    PHYSICAL EXAM: Physical exam reveals a very well-appearing 68 y.o.female in no apparent distress Neurologic: Awake, alert, oriented Psych: Bright affect, conversant Respiratory: Breathing even and unlabored. No stridor or wheezing Abdomen: Soft, nontender, nondistended to palpation. Incisions well-healed. No incisional hernias. Port easily palpated. Extremities: Atraumatic, good range of motion.  ASSESMENT: 68 y.o.  female  s/p AP-Standard lap-band.   PLAN: The patient's port was accessed with a 20G Huber needle without difficulty. Clear fluid was aspirated and 0.25 mL saline was added to the port to give a total predicted volume of 4.25 mL. This was her fill volume prior to her convergence procedure.The patient was able to swallow water without difficulty following the procedure and was instructed to take clear liquids for the next 24-48 hours and advance slowly as tolerated.

## 2013-10-05 DIAGNOSIS — E785 Hyperlipidemia, unspecified: Secondary | ICD-10-CM | POA: Diagnosis not present

## 2013-10-05 DIAGNOSIS — D126 Benign neoplasm of colon, unspecified: Secondary | ICD-10-CM | POA: Diagnosis not present

## 2013-10-05 DIAGNOSIS — E890 Postprocedural hypothyroidism: Secondary | ICD-10-CM | POA: Diagnosis not present

## 2013-10-05 DIAGNOSIS — C50919 Malignant neoplasm of unspecified site of unspecified female breast: Secondary | ICD-10-CM | POA: Diagnosis not present

## 2013-10-05 DIAGNOSIS — Z1331 Encounter for screening for depression: Secondary | ICD-10-CM | POA: Diagnosis not present

## 2013-10-05 DIAGNOSIS — E669 Obesity, unspecified: Secondary | ICD-10-CM | POA: Diagnosis not present

## 2013-10-05 DIAGNOSIS — C73 Malignant neoplasm of thyroid gland: Secondary | ICD-10-CM | POA: Diagnosis not present

## 2013-10-05 DIAGNOSIS — L659 Nonscarring hair loss, unspecified: Secondary | ICD-10-CM | POA: Diagnosis not present

## 2013-10-05 DIAGNOSIS — I4891 Unspecified atrial fibrillation: Secondary | ICD-10-CM | POA: Diagnosis not present

## 2013-10-06 ENCOUNTER — Encounter: Payer: Self-pay | Admitting: Internal Medicine

## 2013-10-06 ENCOUNTER — Ambulatory Visit (INDEPENDENT_AMBULATORY_CARE_PROVIDER_SITE_OTHER): Payer: Medicare Other | Admitting: Internal Medicine

## 2013-10-06 VITALS — BP 133/79 | HR 77 | Temp 98.2°F | Wt 232.0 lb

## 2013-10-06 DIAGNOSIS — B349 Viral infection, unspecified: Secondary | ICD-10-CM

## 2013-10-06 DIAGNOSIS — B9789 Other viral agents as the cause of diseases classified elsewhere: Secondary | ICD-10-CM

## 2013-10-06 DIAGNOSIS — J029 Acute pharyngitis, unspecified: Secondary | ICD-10-CM

## 2013-10-06 LAB — POCT RAPID STREP A (OFFICE): Rapid Strep A Screen: NEGATIVE

## 2013-10-06 NOTE — Progress Notes (Signed)
Pre visit review using our clinic review tool, if applicable. No additional management support is needed unless otherwise documented below in the visit note. 

## 2013-10-06 NOTE — Patient Instructions (Signed)
Rest, fluids , tylenol Fif cough, take Mucinex DM twice a day as needed  For congestion use OTC Nasocort: 2 nasal sprays on each side of the nose daily until you feel better  Call if no better in few days Call anytime if the symptoms are severe, you have high fever, short of breath, chest pain

## 2013-10-06 NOTE — Progress Notes (Signed)
Subjective:    Patient ID: Kayla Harrison, female    DOB: 1946-05-01, 68 y.o.   MRN: [id number]  HPI Acute visit Symptoms started yesterday with sore throat, fatigue, headache. She is also having runny nose but that actually started 2 weeks ago.  Past Medical History  Diagnosis Date  . Morbid obesity     Status post lap band surgery  . Hx of thyroid cancer     Dr Forde Dandy  . Colon polyp     Dr Earlean Shawl  . Dyslipidemia   . Hyperlipidemia   . Hypothyroidism   . Breast cancer     Dr Margot Chimes, total thyroidectomy- 1999- for cancer  . Pneumonia 2010    [facility]- during that event had to be cardioverted   . Arthritis     osteoarthritis - knees   . Normal cardiac stress test     2008- cardiac stress/echo  . Stroke     2003- [place]   . Atrial fibrillation     ablation- 2x's-- 1st time- [facility], 2nd event at [facility] in 2008. Convergent ablation at [facility] 6/14  . Complication of anesthesia     Ketamine produces LSD reaction, bright colored nightmarish experience   . Hepatitis     [place] ?hepatitis   . Sinus node dysfunction     Complicating convergent ablation 6/14  . Pacemaker-Medtronic    Past Surgical History  Procedure Laterality Date  . Thyroidectomy  1998    Dr Margot Chimes  . Bso  1998  . Cholecystectomy    . Tonsillectomy    . Colonoscopy w/ polypectomy      Dr Earlean Shawl  . Afib ablation      X 2; [facility] & Dr Caryl Comes  . Abdominal hysterectomy  1983  . Breast lumpectomy  2010  . Laparoscopic gastric banding  07/10/2010  . Appendectomy    . Knee arthroscopy      bilateral  . Laparotomy      for ruptured ovary and ovarian artery   . Total knee arthroplasty  04/13/2012    Procedure: TOTAL KNEE ARTHROPLASTY;  Surgeon: Rudean Haskell, MD;  Location: [place];  Service: Orthopedics;  Laterality: Right;  . Joint replacement  04/13/12    Right Knee replacement  . Cardioversion  10/09/2012    Procedure: CARDIOVERSION;  Surgeon: Minus Breeding, MD;   Location: [place];  Service: Cardiovascular;  Laterality: N/A;  . Cardioversion  10/09/2012    Procedure: CARDIOVERSION;  Surgeon: Minus Breeding, MD;  Location: [facility] ENDOSCOPY;  Service: Cardiovascular;  Laterality: N/A;  [name] gave the ok to add pt to the add on , but we must check to find out if the can add pt on at [phone] ( [phone])  . Cardioversion N/A 11/20/2012    Procedure: CARDIOVERSION;  Surgeon: Fay Records, MD;  Location: [facility] ENDOSCOPY;  Service: Cardiovascular;  Laterality: N/A;  . Transabdominal ablation  03/02/13    [facility]  . Pacemaker insertion  03/10/13    [facility]  . Convergent      Review of Systems Denies fevers, mild chills which is something that she experiences from time to time. Mild aches today. She did have a flu shot, and no exposure to sick people that she knows of. Admits to mild cough. No nausea, vomiting or diarrhea    Objective:   Physical Exam  BP 133/79  Pulse 77  Temp(Src) 98.2 F (36.8 C)  Wt 232 lb (  105.235 kg)  SpO2 98% General -- alert, well-developed, NAD.  Neck -- no mass or significant LAD; No difficulty swallowing or breathing noted HEENT-- Not pale. TM R-- obscured by was, L normal. Throat symmetric, + redness at posterior wall no d/c, uvula midline  Nose slt congested.  Lungs -- normal respiratory effort, no intercostal retractions, no accessory muscle use, and normal breath sounds.  Heart-- normal rate, regular rhythm, no murmur.   Neurologic--  alert & oriented X3. Speech normal, gait normal. Psych-- Cognition and judgment appear intact. Cooperative with normal attention span and concentration. No anxious or depressed appearing.      Assessment & Plan:   Pharyngitis, Sx c/w pharyngitis, doubt the flu, strp test neg Likely a viral illness, see instructions

## 2013-10-08 ENCOUNTER — Other Ambulatory Visit: Payer: Self-pay | Admitting: Internal Medicine

## 2013-10-08 ENCOUNTER — Encounter: Payer: Self-pay | Admitting: Internal Medicine

## 2013-10-08 MED ORDER — CEFPROZIL 500 MG PO TABS: 500.0000 mg | ORAL_TABLET | Freq: Two times a day (BID) | ORAL | Status: DC

## 2013-10-11 DIAGNOSIS — L259 Unspecified contact dermatitis, unspecified cause: Secondary | ICD-10-CM | POA: Diagnosis not present

## 2013-10-11 DIAGNOSIS — L608 Other nail disorders: Secondary | ICD-10-CM | POA: Diagnosis not present

## 2013-10-11 DIAGNOSIS — L650 Telogen effluvium: Secondary | ICD-10-CM | POA: Diagnosis not present

## 2013-10-19 ENCOUNTER — Encounter: Payer: Self-pay | Admitting: Internal Medicine

## 2013-10-19 ENCOUNTER — Ambulatory Visit (INDEPENDENT_AMBULATORY_CARE_PROVIDER_SITE_OTHER): Payer: Medicare Other | Admitting: Internal Medicine

## 2013-10-19 ENCOUNTER — Encounter: Payer: Self-pay | Admitting: *Deleted

## 2013-10-19 VITALS — BP 98/65 | HR 77 | Temp 98.2°F | Resp 18

## 2013-10-19 DIAGNOSIS — J0110 Acute frontal sinusitis, unspecified: Secondary | ICD-10-CM | POA: Diagnosis not present

## 2013-10-19 DIAGNOSIS — J209 Acute bronchitis, unspecified: Secondary | ICD-10-CM

## 2013-10-19 MED ORDER — AMOXICILLIN-POT CLAVULANATE 875-125 MG PO TABS: 1.0000 | ORAL_TABLET | Freq: Two times a day (BID) | ORAL | Status: DC

## 2013-10-19 MED ORDER — HYDROCODONE-HOMATROPINE 5-1.5 MG/5ML PO SYRP: 5.0000 mL | ORAL_SOLUTION | Freq: Four times a day (QID) | ORAL | Status: DC | PRN

## 2013-10-19 NOTE — Patient Instructions (Signed)
Plain Mucinex (NOT D) for thick secretions ;force NON dairy fluids .   Nasal cleansing in the shower as discussed with lather of mild shampoo.After 10 seconds wash off lather while  exhaling through nostrils. Make sure that all residual soap is removed to prevent irritation.  Nasacort AQ 1 spray in each nostril twice a day as needed. Use the "crossover" technique into opposite nostril spraying toward opposite ear @ 45 degree angle, not straight up into nostril.  Use a Neti pot daily only  as needed for significant sinus congestion; going from open side to congested side . Plain Allegra (NOT D )  160 daily , Loratidine 10 mg , OR Zyrtec 10 mg @ bedtime  as needed for itchy eyes & sneezing.

## 2013-10-19 NOTE — Progress Notes (Signed)
Pre-visit discussion using our clinic review tool, as applicable. No additional management support is needed unless otherwise documented below in the visit note.  

## 2013-10-19 NOTE — Progress Notes (Signed)
   Subjective:    Patient ID: Kayla Harrison, female    DOB: Mar 25, 1946, 68 y.o.   MRN: [id number]  HPI    Despite complete a seven-day course of antibiotics; she continues to have yellow/green nasal discharge as well as some yellow/green sputum.  She awakens with frontal headaches; she denies persistent frontal sinus pain.  She's had some sneezing  The sore throat has resolved completely     Review of Systems At this time she denies fever, chills, or sweats. She has no facial pain, dental pain, otic pain,or  otic discharge  There is no associated itchy, watery eyes.  She is not having shortness of breath or wheezing with productive cough.  Her last PT/INR was 2.0 2 weeks ago.      Objective:   Physical Exam General appearance:good health ;well nourished; no acute distress or increased work of breathing is present.  No  lymphadenopathy about the head, neck, or axilla noted.   Eyes: No conjunctival inflammation or lid edema is present.   Ears:  External ear exam shows no significant lesions or deformities.  Otoscopic examination reveals clear canals, tympanic membranes are intact bilaterally without bulging, retraction, inflammation or discharge.  Nose:  External nasal examination shows no deformity or inflammation. Nasal mucosa are pink and moist without lesions or exudates. No septal dislocation or deviation.No obstruction to airflow.   Oral exam: Dental hygiene is good; lips and gums are healthy appearing.There is no oropharyngeal erythema or exudate noted.   Neck:  No deformities, thyromegaly, masses, or tenderness noted.    Heart:  Normal rate and regular rhythm. S1 and S2 normal without gallop, murmur, click, rub or other extra sounds. S4 with slurring   Lungs:Chest clear to auscultation; no wheezes, rhonchi,rales ,or rubs present.No increased work of breathing.    Extremities:  No cyanosis, edema, or clubbing  noted    Skin: Warm & dry w/o            Assessment & Plan:  #1 rhinosinusitis with bronchitis  Plan: Nasal hygiene interventions discussed. See prescription medications

## 2013-10-25 ENCOUNTER — Ambulatory Visit (INDEPENDENT_AMBULATORY_CARE_PROVIDER_SITE_OTHER): Payer: Medicare Other | Admitting: *Deleted

## 2013-10-25 DIAGNOSIS — I4891 Unspecified atrial fibrillation: Secondary | ICD-10-CM | POA: Diagnosis not present

## 2013-10-25 LAB — MDC_IDC_ENUM_SESS_TYPE_REMOTE
Brady Statistic AP VP Percent: 0.1 %
Brady Statistic AS VP Percent: 0.1 %
Brady Statistic AS VS Percent: 79.6 %
Lead Channel Pacing Threshold Amplitude: 0.875 V
Lead Channel Pacing Threshold Amplitude: 1.25 V
Lead Channel Pacing Threshold Pulse Width: 0.4 ms
Lead Channel Pacing Threshold Pulse Width: 0.4 ms
Lead Channel Sensing Intrinsic Amplitude: 0.9 mV
Lead Channel Sensing Intrinsic Amplitude: 10.8 mV
Lead Channel Setting Pacing Amplitude: 2.5 V
Lead Channel Setting Pacing Pulse Width: 0.4 ms
Lead Channel Setting Sensing Sensitivity: 0.9 mV
MDC IDC SET LEADCHNL RA PACING AMPLITUDE: 2 V
MDC IDC STAT BRADY AP VS PERCENT: 29.3 %

## 2013-10-26 ENCOUNTER — Ambulatory Visit (INDEPENDENT_AMBULATORY_CARE_PROVIDER_SITE_OTHER): Payer: Medicare Other | Admitting: Pharmacist

## 2013-10-26 DIAGNOSIS — I4891 Unspecified atrial fibrillation: Secondary | ICD-10-CM | POA: Diagnosis not present

## 2013-10-26 DIAGNOSIS — Z7901 Long term (current) use of anticoagulants: Secondary | ICD-10-CM

## 2013-10-26 DIAGNOSIS — I6350 Cerebral infarction due to unspecified occlusion or stenosis of unspecified cerebral artery: Secondary | ICD-10-CM | POA: Diagnosis not present

## 2013-10-26 LAB — POCT INR: INR: 2.1

## 2013-10-29 ENCOUNTER — Telehealth: Payer: Self-pay | Admitting: Internal Medicine

## 2013-10-29 NOTE — Telephone Encounter (Signed)
New Message  Pt called states that she received an alert to schedule a pacer check appt// expained to pt that it was from the recall in the system. Pt is requesting a call back to determine

## 2013-11-01 NOTE — Telephone Encounter (Addendum)
Remote received on 10/25/13. Pt excited that her AT/AF burden is <0.1%. Pt had a convergent ablation.   Pt read "somewhere" her device is not needed; wants clarification. Pt also concerned excess lead under device has slightly migrated causing it to protrude through skin (skin not pierced or broken).  Scheduled her to see Dr. Caryl Comes 11/05/13 @ 11:15.

## 2013-11-01 NOTE — Telephone Encounter (Signed)
New message     Pt want you to know that she sent her pacer ck remotely.  Did you get it?

## 2013-11-01 NOTE — Telephone Encounter (Signed)
Routed to device clinic

## 2013-11-04 ENCOUNTER — Other Ambulatory Visit: Payer: Self-pay

## 2013-11-04 MED ORDER — AMIODARONE HCL 200 MG PO TABS: 200.0000 mg | ORAL_TABLET | Freq: Every day | ORAL | Status: DC

## 2013-11-05 ENCOUNTER — Encounter: Payer: Self-pay | Admitting: Internal Medicine

## 2013-11-05 ENCOUNTER — Encounter: Payer: Self-pay | Admitting: *Deleted

## 2013-11-05 ENCOUNTER — Telehealth: Payer: Self-pay | Admitting: *Deleted

## 2013-11-05 ENCOUNTER — Ambulatory Visit (INDEPENDENT_AMBULATORY_CARE_PROVIDER_SITE_OTHER): Payer: Medicare Other | Admitting: Internal Medicine

## 2013-11-05 VITALS — BP 133/78 | HR 78 | Ht 66.0 in | Wt 230.0 lb

## 2013-11-05 DIAGNOSIS — T82190A Other mechanical complication of cardiac electrode, initial encounter: Secondary | ICD-10-CM | POA: Diagnosis not present

## 2013-11-05 DIAGNOSIS — I6350 Cerebral infarction due to unspecified occlusion or stenosis of unspecified cerebral artery: Secondary | ICD-10-CM

## 2013-11-05 DIAGNOSIS — I4891 Unspecified atrial fibrillation: Secondary | ICD-10-CM | POA: Diagnosis not present

## 2013-11-05 NOTE — Telephone Encounter (Signed)
I called and left pt a message about coming in on 11/24/13 for lab work same day as her coumadin clinic appointment Horton Chin RN

## 2013-11-05 NOTE — Progress Notes (Signed)
Patient Care Team: Hendricks Limes, MD as PCP - General   HPI  Kayla Harrison is a 68 y.o. female Seen  She has atrial fibrillation for which she underwent pulmonary vein isolation at [facility] She's had problems with recurrent atrial fibrillation. She underwent convergent ablation at [facility] 7/51 it was complicated by heart block prompting pacemaker implantation.   She comes day with complaints that she has discomfort when she moves her arm related to the position of her pacemaker. It is notable and she moves her arms up for or across her chest; she does not feel it when she moves her arms above her head.   She has a history of prior strokes and remains on oral anticoagulation .  Echo 2011 EF 55% with mod LAE-60mm     LV function was normal  Echocardiogram post ablation demonstrated normal left ventricular function and without effusion.  She also has a history of morbid obesity status post lap band surgery  Past Medical History  Diagnosis Date  . Morbid obesity     Status post lap band surgery  . Hx of thyroid cancer     Dr Forde Dandy  . Colon polyp     Dr Earlean Shawl  . Dyslipidemia   . Hyperlipidemia   . Hypothyroidism   . Breast cancer     Dr Margot Chimes, total thyroidectomy- 1999- for cancer  . Pneumonia 2010    [facility]- during that event had to be cardioverted   . Arthritis     osteoarthritis - knees   . Normal cardiac stress test     2008- cardiac stress/echo  . Stroke     2003- [place]   . Atrial fibrillation     ablation- 2x's-- 1st time- [facility], 2nd event at [facility] in 2008. Convergent ablation at [facility] 6/14  . Complication of anesthesia     Ketamine produces LSD reaction, bright colored nightmarish experience   . Hepatitis     [place] ?hepatitis   . Sinus node dysfunction     Complicating convergent ablation 6/14  . Pacemaker-Medtronic     Past Surgical History  Procedure Laterality Date  . Thyroidectomy  1998    Dr  Margot Chimes  . Bso  1998  . Cholecystectomy    . Tonsillectomy    . Colonoscopy w/ polypectomy      Dr Earlean Shawl  . Afib ablation      X 2; [facility] & Dr Caryl Comes  . Abdominal hysterectomy  1983  . Breast lumpectomy  2010  . Laparoscopic gastric banding  07/10/2010  . Appendectomy    . Knee arthroscopy      bilateral  . Laparotomy      for ruptured ovary and ovarian artery   . Total knee arthroplasty  04/13/2012    Procedure: TOTAL KNEE ARTHROPLASTY;  Surgeon: Rudean Haskell, MD;  Location: [place];  Service: Orthopedics;  Laterality: Right;  . Joint replacement  04/13/12    Right Knee replacement  . Cardioversion  10/09/2012    Procedure: CARDIOVERSION;  Surgeon: Minus Breeding, MD;  Location: [place];  Service: Cardiovascular;  Laterality: N/A;  . Cardioversion  10/09/2012    Procedure: CARDIOVERSION;  Surgeon: Minus Breeding, MD;  Location: [facility] ENDOSCOPY;  Service: Cardiovascular;  Laterality: N/A;  [name] gave the ok to add pt to the add on , but we must check to find out if the can add pt on  at 1400 [phone])  . Cardioversion N/A 11/20/2012    Procedure: CARDIOVERSION;  Surgeon: Fay Records, MD;  Location: [facility] ENDOSCOPY;  Service: Cardiovascular;  Laterality: N/A;  . Transabdominal ablation  03/02/13    [facility]  . Pacemaker insertion  03/10/13    [facility]  . Convergent      Current Outpatient Prescriptions  Medication Sig Dispense Refill  . amiodarone (PACERONE) 200 MG tablet Take 1 tablet (200 mg total) by mouth daily.  30 tablet  6  . BIOTIN PO Take by mouth.      Marland Kitchen HYDROcodone-homatropine (HYDROMET) 5-1.5 MG/5ML syrup Take 5 mLs by mouth every 6 (six) hours as needed for cough.  120 mL  0  . levothyroxine (SYNTHROID, LEVOTHROID) 175 MCG tablet Take 175 mcg by mouth daily before breakfast. TAKE 1 TABLET BY MOUTH EVERY DAY EXCEPT ON SAT.      . Multiple Vitamin (MULTIVITAMIN) tablet Take 1 tablet by mouth daily.      . pravastatin (PRAVACHOL) 40 MG tablet Take 40 mg by mouth daily.      . ranitidine  (ZANTAC) 150 MG tablet Take 1 tablet (150 mg total) by mouth 2 (two) times daily.  60 tablet  0  . TAURINE PO Take by mouth.      . warfarin (COUMADIN) 5 MG tablet Take as directed by Anticoagulation clinic  30 tablet  3  . amoxicillin-clavulanate (AUGMENTIN) 875-125 MG per tablet Take 1 tablet by mouth every 12 (twelve) hours. Take with meal  14 tablet  0  . hydrOXYzine (ATARAX/VISTARIL) 10 MG tablet Take 1 tablet (10 mg total) by mouth every 6 (six) hours as needed.  30 tablet  0   No current facility-administered medications for this visit.    Allergies  Allergen Reactions  . Tikosyn [Dofetilide]     Prolonged QT interval Prolonged QT interval  . Xarelto [Rivaroxaban]     Nose Bleed X 6 hrs ; packing in ER  . Epinephrine Other (See Comments) and Rash    Dental form only (liquid). Patient stated she will become "out of it" she can hear you but cannot respond in normal fashion.  Marland Kitchen Ketamine     Hallucinations    Review of Systems negative except from HPI and PMH  Physical Exam BP 133/78  Pulse 78  Ht 5\' 6"  (1.676 m)  Wt 230 lb (104.327 kg)  BMI 37.14 kg/m2 Well developed and well nourished in no acute distress HENT normal E scleral and icterus clear Neck Supple JVP flat; carotids brisk and full Clear to ausculation  pacemaker pocket is well-healed. The device is somewhat lateral and cephalad and encroaches into her shoulder as she flexes her arm. It is particularly uncomfortable on the lateral margin of the pacemaker pulse generator.  Regular rate and rhythm, no murmurs gallops or rub Soft with active bowel sounds No clubbing cyanosis none Edema Alert and oriented, grossly normal motor and sensory function Skin Warm and Dry    Assessment and  Plan

## 2013-11-05 NOTE — Patient Instructions (Signed)
You have been scheduled at [facility] on 12/08/13 at 6:30am ( this is the time to be there) for a generator pocket revision  We will mail you a letter of instruction about this  You will also need to come in for lab work on Monday 12/06/13 for nonfasting lab work  Your physician recommends that you continue on your current medications as directed. Please refer to the Current Medication list given to you today.

## 2013-11-06 DIAGNOSIS — T82190A Other mechanical complication of cardiac electrode, initial encounter: Secondary | ICD-10-CM | POA: Insufficient documentation

## 2013-11-06 NOTE — Assessment & Plan Note (Signed)
The patient has impingement I think related to the lateral aspect of her pacemaker and shoulder movement. This was identified reproducibly. Increasing great deal of discomfort and limits her ability to exercise. We have discussed the potential issues related to pulse generator movement and surgical risk of infection. Notwithstanding, she would like to do that. We have reprogrammed her pacemaker to inactivate rate response. There is no evidence of ongoing heart block. Most of her 20% atrial pacing is D. Of rate response and not in her lower rate limit. We further discussed the possibility of removal of the pacemaker altogether. At this point she would like not to do that. An intermediate reprogramming has been undertaken. we potentially could remove the pulse generator and cap the leads.Marland Kitchen

## 2013-11-06 NOTE — Assessment & Plan Note (Signed)
Relatively stable. Continue anticoagulation

## 2013-11-08 ENCOUNTER — Encounter: Payer: Self-pay | Admitting: Internal Medicine

## 2013-11-10 ENCOUNTER — Encounter: Payer: Self-pay | Admitting: *Deleted

## 2013-11-11 ENCOUNTER — Encounter: Payer: Self-pay | Admitting: Internal Medicine

## 2013-11-11 IMAGING — CR DG CHEST 2V
2 series · 2 of 2 positions shown · non-contrast
Comparison: 06/18/2010

CLINICAL DATA: Preoperative respiratory exam for total knee
arthroplasty.

CHEST - 2 VIEW

[view not recorded (1 of 2)]
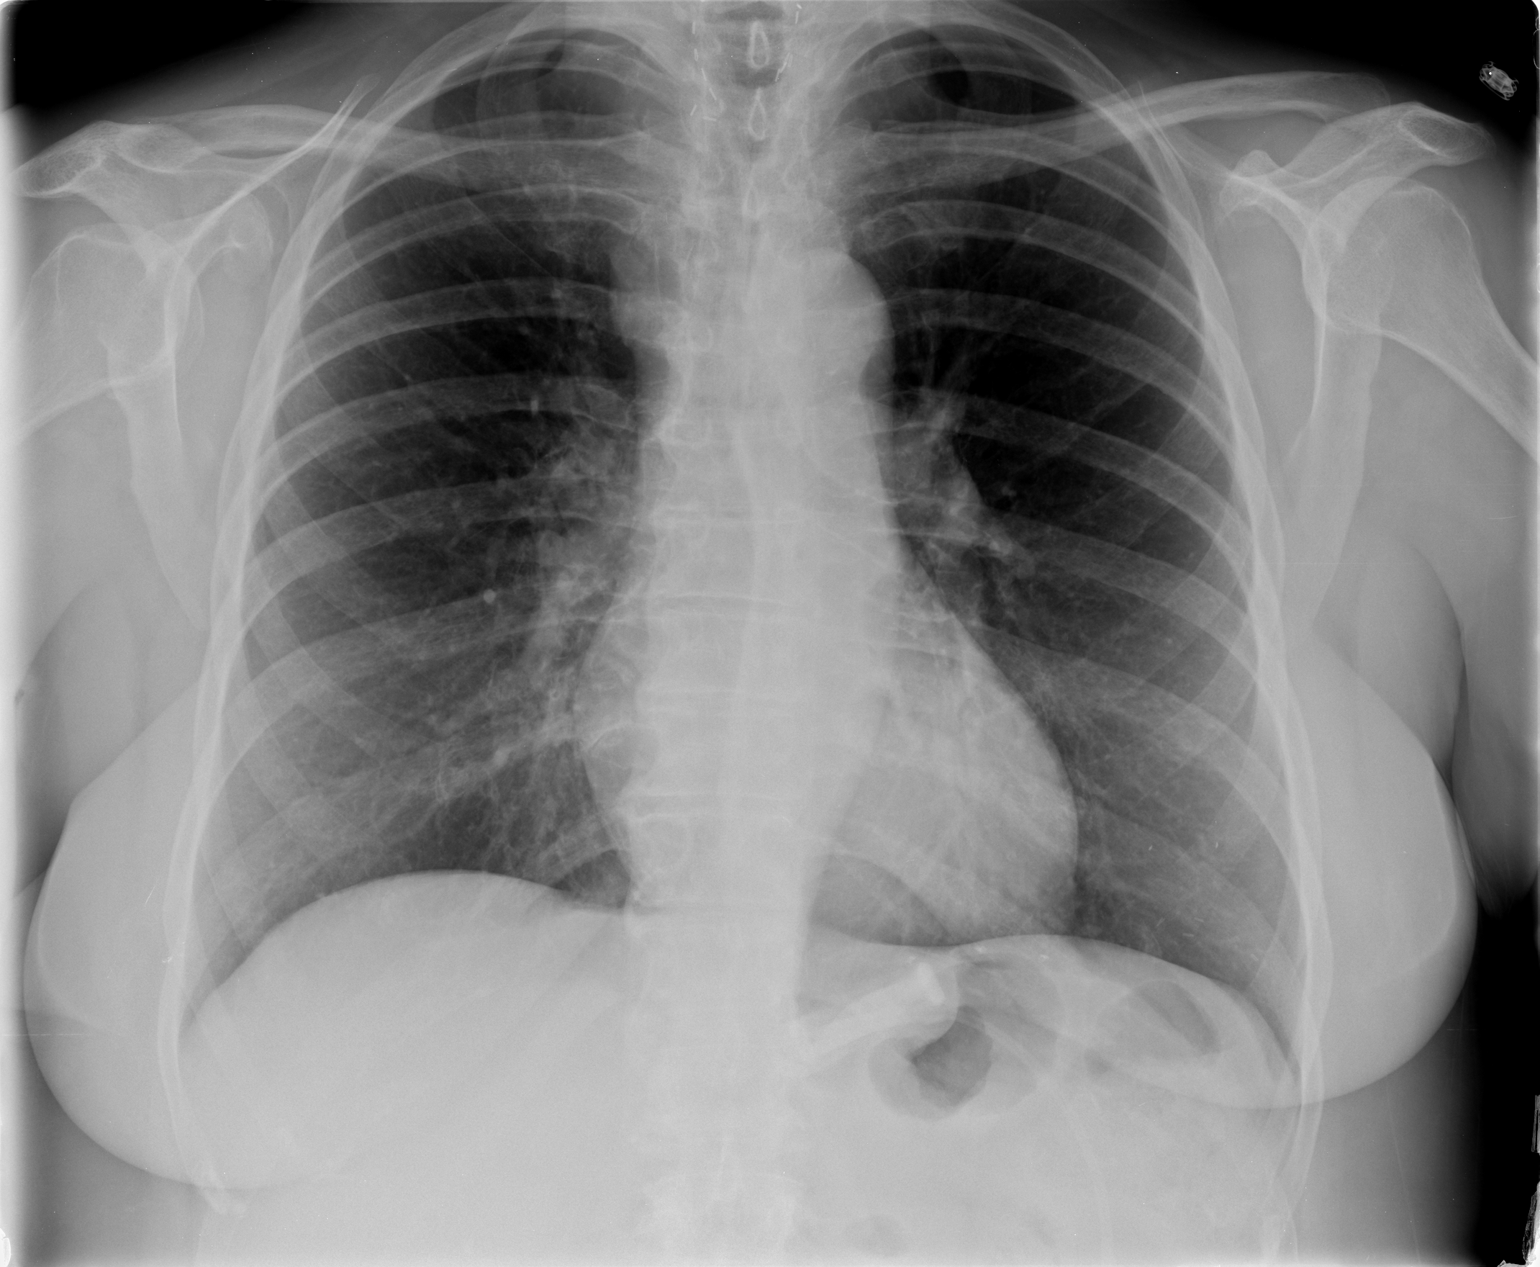

[view not recorded (2 of 2)]
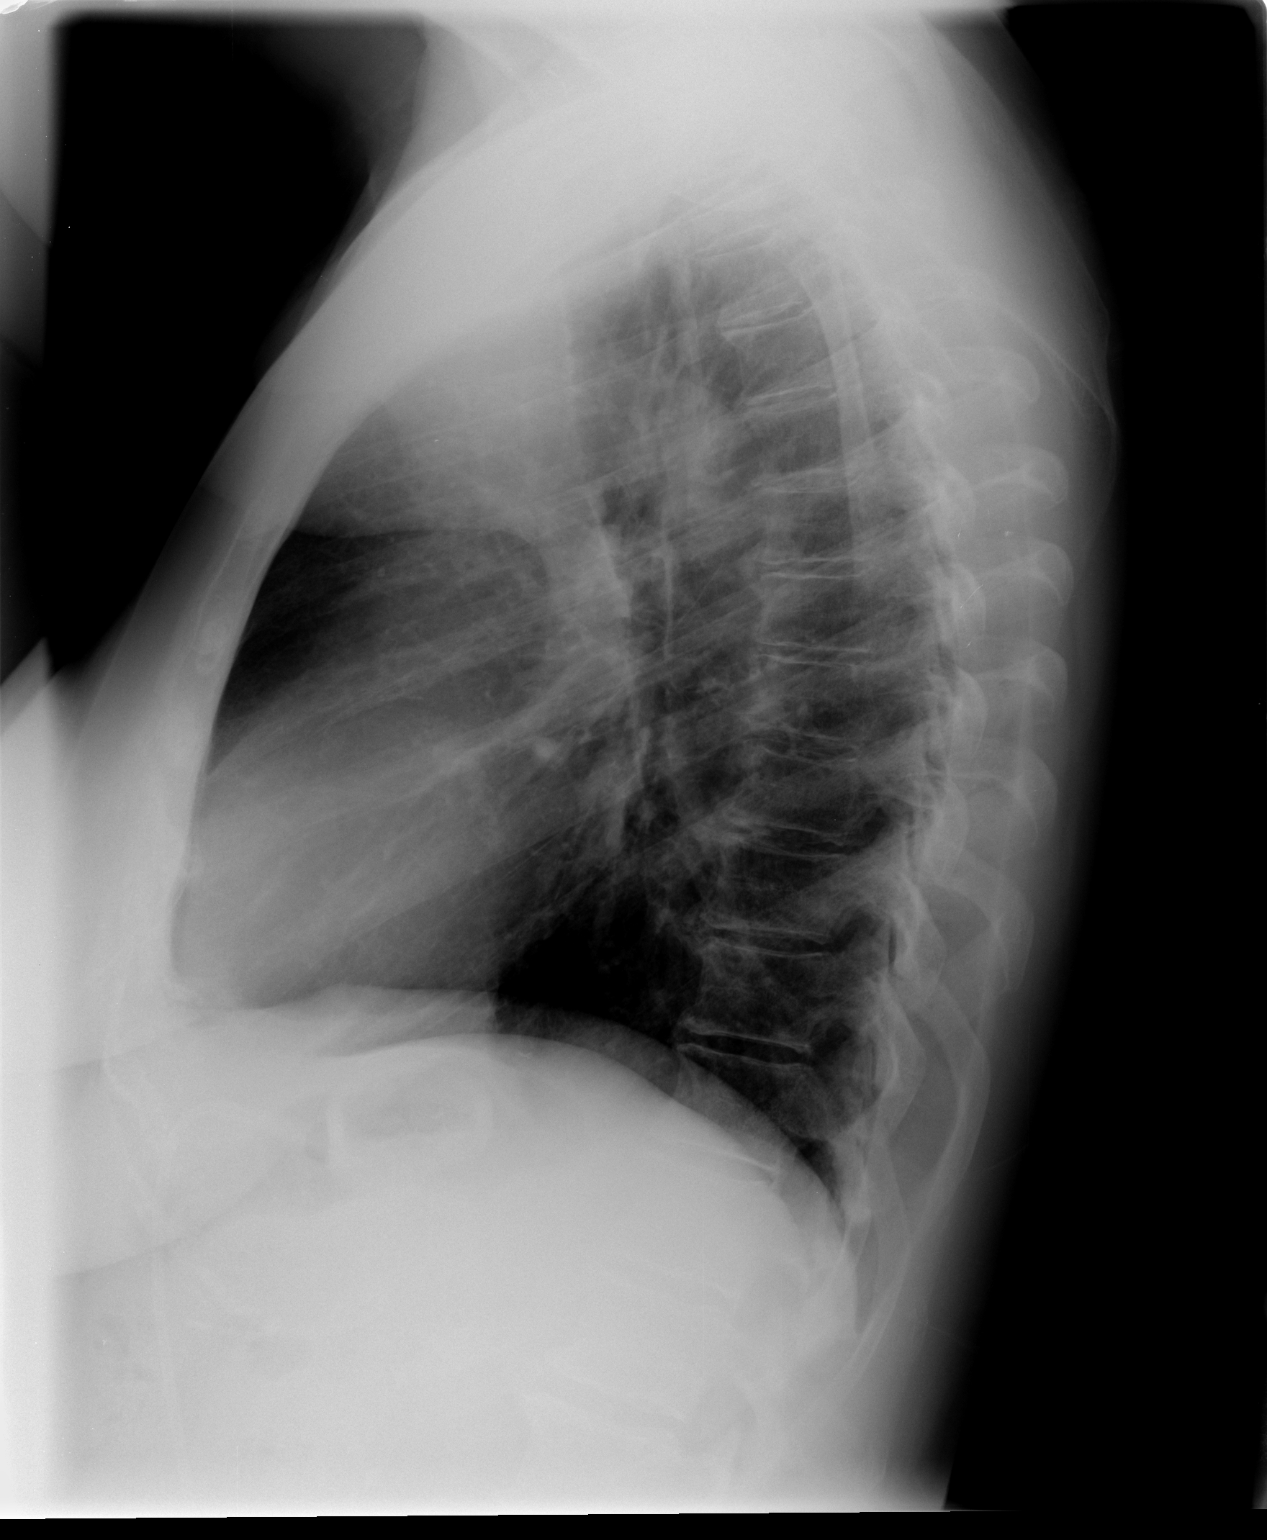

[2 of 2 positions shown; findings below may reference images not displayed]

FINDINGS: Heart size is normal.  Mediastinal shadows are normal.
The lungs are clear.  No effusions.  No significant bony finding.
IMPRESSION: Normal chest

## 2013-11-16 ENCOUNTER — Encounter: Payer: Self-pay | Admitting: Internal Medicine

## 2013-11-17 ENCOUNTER — Other Ambulatory Visit: Payer: Self-pay | Admitting: Internal Medicine

## 2013-11-17 ENCOUNTER — Encounter: Payer: Self-pay | Admitting: *Deleted

## 2013-11-17 DIAGNOSIS — D126 Benign neoplasm of colon, unspecified: Secondary | ICD-10-CM

## 2013-11-17 NOTE — Telephone Encounter (Signed)
Dr. Caryl Comes spoke with patient - assured her that she would remain on Coumadin for device procedure.   Recommended colonoscopy to be done with Lovenox bridging.  Patient verbalized understanding and agreeable to plan.

## 2013-11-24 ENCOUNTER — Other Ambulatory Visit (INDEPENDENT_AMBULATORY_CARE_PROVIDER_SITE_OTHER): Payer: Medicare Other

## 2013-11-24 ENCOUNTER — Ambulatory Visit (INDEPENDENT_AMBULATORY_CARE_PROVIDER_SITE_OTHER): Payer: Medicare Other | Admitting: *Deleted

## 2013-11-24 DIAGNOSIS — I6350 Cerebral infarction due to unspecified occlusion or stenosis of unspecified cerebral artery: Secondary | ICD-10-CM

## 2013-11-24 DIAGNOSIS — Z5181 Encounter for therapeutic drug level monitoring: Secondary | ICD-10-CM | POA: Insufficient documentation

## 2013-11-24 DIAGNOSIS — I4891 Unspecified atrial fibrillation: Secondary | ICD-10-CM

## 2013-11-24 DIAGNOSIS — Z7901 Long term (current) use of anticoagulants: Secondary | ICD-10-CM

## 2013-11-24 LAB — BASIC METABOLIC PANEL
BUN: 25 mg/dL — AB (ref 6–23)
CO2: 28 mEq/L (ref 19–32)
Calcium: 9.5 mg/dL (ref 8.4–10.5)
Chloride: 107 mEq/L (ref 96–112)
Creatinine, Ser: 1.2 mg/dL (ref 0.4–1.2)
GFR: 49.16 mL/min — AB (ref >60.00)
Glucose, Bld: 83 mg/dL (ref 70–99)
POTASSIUM: 4.6 meq/L (ref 3.5–5.1)
SODIUM: 142 meq/L (ref 135–145)

## 2013-11-24 LAB — CBC WITH DIFFERENTIAL/PLATELET
BASOS ABS: 0 10*3/uL (ref 0.0–0.1)
BASOS PCT: 0.4 % (ref 0.0–3.0)
Eosinophils Absolute: 0.1 10*3/uL (ref 0.0–0.7)
Eosinophils Relative: 2.1 % (ref 0.0–5.0)
HEMATOCRIT: 38.5 % (ref 36.0–46.0)
Hemoglobin: 12.6 g/dL (ref 12.0–15.0)
LYMPHS ABS: 1.1 10*3/uL (ref 0.7–4.0)
Lymphocytes Relative: 17.8 % (ref 12.0–46.0)
MCHC: 32.8 g/dL (ref 30.0–36.0)
MCV: 86 fl (ref 78.0–100.0)
MONOS PCT: 8.9 % (ref 3.0–12.0)
Monocytes Absolute: 0.6 10*3/uL (ref 0.1–1.0)
NEUTROS ABS: 4.5 10*3/uL (ref 1.4–7.7)
Neutrophils Relative %: 70.8 % (ref 43.0–77.0)
Platelets: 206 10*3/uL (ref 150.0–400.0)
RBC: 4.48 Mil/uL (ref 3.87–5.11)
RDW: 16.3 % — AB (ref 11.5–14.6)
WBC: 6.4 10*3/uL (ref 4.5–10.5)

## 2013-11-24 LAB — PROTIME-INR
INR: 2.6 ratio — AB (ref 0.8–1.0)
PROTHROMBIN TIME: 27.1 s — AB (ref 10.2–12.4)

## 2013-11-24 LAB — POCT INR: INR: 2.4

## 2013-11-29 ENCOUNTER — Encounter (HOSPITAL_COMMUNITY): Payer: Self-pay | Admitting: Pharmacy Technician

## 2013-12-02 DIAGNOSIS — H2510 Age-related nuclear cataract, unspecified eye: Secondary | ICD-10-CM | POA: Diagnosis not present

## 2013-12-06 ENCOUNTER — Encounter: Payer: Self-pay | Admitting: Internal Medicine

## 2013-12-06 ENCOUNTER — Ambulatory Visit (INDEPENDENT_AMBULATORY_CARE_PROVIDER_SITE_OTHER): Payer: Medicare Other | Admitting: *Deleted

## 2013-12-06 ENCOUNTER — Other Ambulatory Visit: Payer: Medicare Other

## 2013-12-06 DIAGNOSIS — Z7901 Long term (current) use of anticoagulants: Secondary | ICD-10-CM

## 2013-12-06 DIAGNOSIS — I6350 Cerebral infarction due to unspecified occlusion or stenosis of unspecified cerebral artery: Secondary | ICD-10-CM | POA: Diagnosis not present

## 2013-12-06 DIAGNOSIS — I4891 Unspecified atrial fibrillation: Secondary | ICD-10-CM | POA: Diagnosis not present

## 2013-12-06 DIAGNOSIS — Z5181 Encounter for therapeutic drug level monitoring: Secondary | ICD-10-CM | POA: Diagnosis not present

## 2013-12-06 LAB — POCT INR: INR: 2.8

## 2013-12-07 ENCOUNTER — Other Ambulatory Visit: Payer: Self-pay | Admitting: Internal Medicine

## 2013-12-07 DIAGNOSIS — T82190A Other mechanical complication of cardiac electrode, initial encounter: Secondary | ICD-10-CM

## 2013-12-08 ENCOUNTER — Ambulatory Visit (HOSPITAL_COMMUNITY)
Admission: RE | Admit: 2013-12-08 | Discharge: 2013-12-08 | Disposition: A | Discharge status: Home / Self Care | Payer: Medicare Other | Source: Ambulatory Visit | Attending: Internal Medicine | Admitting: Internal Medicine

## 2013-12-08 ENCOUNTER — Encounter (HOSPITAL_COMMUNITY)
Admission: RE | Disposition: A | Discharge status: Home / Self Care | Payer: Self-pay | Source: Ambulatory Visit | Attending: Internal Medicine

## 2013-12-08 DIAGNOSIS — Y831 Surgical operation with implant of artificial internal device as the cause of abnormal reaction of the patient, or of later complication, without mention of misadventure at the time of the procedure: Secondary | ICD-10-CM | POA: Insufficient documentation

## 2013-12-08 DIAGNOSIS — I4891 Unspecified atrial fibrillation: Secondary | ICD-10-CM | POA: Diagnosis not present

## 2013-12-08 DIAGNOSIS — Z853 Personal history of malignant neoplasm of breast: Secondary | ICD-10-CM | POA: Insufficient documentation

## 2013-12-08 DIAGNOSIS — T82190A Other mechanical complication of cardiac electrode, initial encounter: Secondary | ICD-10-CM | POA: Diagnosis not present

## 2013-12-08 DIAGNOSIS — Z8673 Personal history of transient ischemic attack (TIA), and cerebral infarction without residual deficits: Secondary | ICD-10-CM | POA: Insufficient documentation

## 2013-12-08 DIAGNOSIS — Z9884 Bariatric surgery status: Secondary | ICD-10-CM | POA: Insufficient documentation

## 2013-12-08 DIAGNOSIS — E785 Hyperlipidemia, unspecified: Secondary | ICD-10-CM | POA: Insufficient documentation

## 2013-12-08 DIAGNOSIS — E039 Hypothyroidism, unspecified: Secondary | ICD-10-CM | POA: Diagnosis not present

## 2013-12-08 DIAGNOSIS — X58XXXA Exposure to other specified factors, initial encounter: Secondary | ICD-10-CM | POA: Insufficient documentation

## 2013-12-08 DIAGNOSIS — Z96659 Presence of unspecified artificial knee joint: Secondary | ICD-10-CM | POA: Insufficient documentation

## 2013-12-08 HISTORY — PX: POCKET REVISION: SHX5488

## 2013-12-08 LAB — SURGICAL PCR SCREEN
MRSA, PCR: NEGATIVE
STAPHYLOCOCCUS AUREUS: NEGATIVE

## 2013-12-08 LAB — GLUCOSE, CAPILLARY
Glucose-Capillary: 88 mg/dL (ref 70–99)
Glucose-Capillary: 83 mg/dL (ref 70–99)

## 2013-12-08 LAB — PROTIME-INR
INR: 2.28 — AB (ref 0.00–1.49)
PROTHROMBIN TIME: 24.4 s — AB (ref 11.6–15.2)

## 2013-12-08 SURGERY — POCKET REVISION
Anesthesia: LOCAL

## 2013-12-08 MED ORDER — CHLORHEXIDINE GLUCONATE 4 % EX LIQD
60.0000 mL | Freq: Once | CUTANEOUS | Status: DC
  Filled 2013-12-08: qty 60

## 2013-12-08 MED ORDER — ONDANSETRON HCL 4 MG/2ML IJ SOLN
4.0000 mg | Freq: Four times a day (QID) | INTRAMUSCULAR | Status: DC | PRN
Start: 2013-12-08 — End: 2013-12-08

## 2013-12-08 MED ORDER — ACETAMINOPHEN 325 MG PO TABS: 325.0000 mg | ORAL_TABLET | ORAL | Status: DC | PRN

## 2013-12-08 MED ORDER — MIDAZOLAM HCL 5 MG/5ML IJ SOLN
INTRAMUSCULAR | Status: AC
  Filled 2013-12-08: qty 5

## 2013-12-08 MED ORDER — SODIUM CHLORIDE 0.9 % IV SOLN
INTRAVENOUS | Status: DC
  Administered 2013-12-08: 07:00:00 via INTRAVENOUS

## 2013-12-08 MED ORDER — LIDOCAINE HCL (PF) 1 % IJ SOLN
INTRAMUSCULAR | Status: AC
  Filled 2013-12-08: qty 60

## 2013-12-08 MED ORDER — SODIUM CHLORIDE 0.9 % IR SOLN
80.0000 mg | Status: DC
  Filled 2013-12-08 (×2): qty 2

## 2013-12-08 MED ORDER — MUPIROCIN 2 % EX OINT
TOPICAL_OINTMENT | CUTANEOUS | Status: DC
Start: 2013-12-08 — End: 2013-12-08
  Filled 2013-12-08: qty 22

## 2013-12-08 MED ORDER — FENTANYL CITRATE 0.05 MG/ML IJ SOLN
INTRAMUSCULAR | Status: AC
  Filled 2013-12-08: qty 2

## 2013-12-08 MED ORDER — CEFAZOLIN SODIUM-DEXTROSE 2-3 GM-% IV SOLR
2.0000 g | INTRAVENOUS | Status: DC
  Filled 2013-12-08: qty 50

## 2013-12-08 MED ORDER — SODIUM CHLORIDE 0.9 % IV SOLN: INTRAVENOUS | Status: DC

## 2013-12-08 MED ORDER — MUPIROCIN 2 % EX OINT
TOPICAL_OINTMENT | Freq: Two times a day (BID) | CUTANEOUS | Status: DC
  Administered 2013-12-08: 1 via NASAL
  Filled 2013-12-08: qty 22

## 2013-12-08 NOTE — CV Procedure (Signed)
Daniella Dewberry [id number]  [id number]  Preop [id number] of the shoulder by pacemaker and leads Postop Dx same/   Procedure: revision and repositioning of the device and leads  Cx: None   Dictation number [id number]  [name], MD 12/08/2013 9:27 AM

## 2013-12-08 NOTE — H&P (Signed)
Patient Care Team: Hendricks Limes, MD as PCP - General   HPI  Kayla Harrison is a 68 y.o. female Here today for device pocket revision to move medially a prev implanted device which constrains shoulder movements  She has atrial fibrillation for which she underwent pulmonary vein isolation at [facility] 2009 She's had problems with recurrent atrial fibrillation. She underwent convergent ablation at [facility] 2/56 it was complicated by heart block prompting the pacemaker implantation.     She has a history of prior strokes and remains on oral anticoagulation .  Echo 2011 EF 55% with mod LAE-73mm  LV function was normal Echocardiogram post ablation demonstrated normal left ventricular function and without effusion.  Past Medical History  Diagnosis Date  . Morbid obesity     Status post lap band surgery  . Hx of thyroid cancer     Dr Forde Dandy  . Colon polyp     Dr Earlean Shawl  . Dyslipidemia   . Hyperlipidemia   . Hypothyroidism   . Breast cancer     Dr Margot Chimes, total thyroidectomy- 1999- for cancer  . Pneumonia 2010    [facility]- during that event had to be cardioverted   . Arthritis     osteoarthritis - knees   . Normal cardiac stress test     2008- cardiac stress/echo  . Stroke     2003- [place]   . Atrial fibrillation     ablation- 2x's-- 1st time- [facility], 2nd event at [facility] in 2008. Convergent ablation at [facility] 6/14  . Complication of anesthesia     Ketamine produces LSD reaction, bright colored nightmarish experience   . Hepatitis     [place], ?hepatitis   . Sinus node dysfunction     Complicating convergent ablation 6/14  . Pacemaker-Medtronic     Past Surgical History  Procedure Laterality Date  . Thyroidectomy  1998    Dr Margot Chimes  . Bso  1998  . Cholecystectomy    . Tonsillectomy    . Colonoscopy w/ polypectomy      Dr Earlean Shawl  . Afib ablation      X 2; [facility] & Dr Caryl Comes  . Abdominal hysterectomy  1983  . Breast lumpectomy   2010  . Laparoscopic gastric banding  07/10/2010  . Appendectomy    . Knee arthroscopy      bilateral  . Laparotomy      for ruptured ovary and ovarian artery   . Total knee arthroplasty  04/13/2012    Procedure: TOTAL KNEE ARTHROPLASTY;  Surgeon: Rudean Haskell, MD;  Location: [place];  Service: Orthopedics;  Laterality: Right;  . Joint replacement  04/13/12    Right Knee replacement  . Cardioversion  10/09/2012    Procedure: CARDIOVERSION;  Surgeon: Minus Breeding, MD;  Location: [place];  Service: Cardiovascular;  Laterality: N/A;  . Cardioversion  10/09/2012    Procedure: CARDIOVERSION;  Surgeon: Minus Breeding, MD;  Location: [facility] ENDOSCOPY;  Service: Cardiovascular;  Laterality: N/A;  [name] gave the ok to add pt to the add on , but we must check to find out if the can add pt on at 1400 ( [phone])  . Cardioversion N/A 11/20/2012    Procedure: CARDIOVERSION;  Surgeon: Fay Records, MD;  Location: [facility] ENDOSCOPY;  Service: Cardiovascular;  Laterality: N/A;  . Transabdominal ablation  03/02/13    [facility]  . Pacemaker insertion  03/10/13  [facility]  . Convergent      Current Facility-Administered Medications  Medication Dose Route Frequency Provider Last Rate Last Dose  . 0.9 %  sodium chloride infusion   Intravenous Continuous Deboraha Sprang, MD 50 mL/hr at 12/08/13 [phone]    . ceFAZolin (ANCEF) IVPB 2 g/50 mL premix  2 g Intravenous On Call Deboraha Sprang, MD      . chlorhexidine (HIBICLENS) 4 % liquid 4 application  60 mL Topical Once Deboraha Sprang, MD      . gentamicin (GARAMYCIN) 80 mg in sodium chloride irrigation 0.9 % 500 mL irrigation  80 mg Irrigation On Call Deboraha Sprang, MD      . mupirocin ointment (BACTROBAN) 2 %           . mupirocin ointment (BACTROBAN) 2 %   Nasal BID Deboraha Sprang, MD   1 application at 55/97/41 [phone]    Allergies  Allergen Reactions  . Tikosyn [Dofetilide]     Prolonged QT interval Prolonged QT interval  . Xarelto [Rivaroxaban]     Nose Bleed X 6 hrs ;  packing in ER  . Epinephrine Other (See Comments) and Rash    Dental form only (liquid). Patient stated she will become "out of it" she can hear you but cannot respond in normal fashion.  Marland Kitchen Ketamine     Hallucinations    Review of Systems negative except from HPI and PMH  Physical Exam BP 156/78  Pulse 69  Temp(Src) 98.2 F (36.8 C) (Oral)  Resp 18  Ht 5\' 6"  (1.676 m)  Wt 220 lb (99.791 kg)  BMI 35.53 kg/m2  SpO2 97% Well developed and well nourished in no acute distress HENT normal E scleral and icterus clear Neck Supple JVP flat; carotids brisk and full Clear to ausculation  Regular rate and rhythm, no murmurs gallops or rub Soft with active bowel sounds No clubbing cyanosis  Edema Alert and oriented, grossly normal motor and sensory function Skin Warm and Dry Device pocket well healed; without hematoma or erythema.  There is no tethering there is liittle room between the generator can and teh shoulder   Assessment and  Plan  Lateral location of the pacer contributing to pain and limited ROM of the ipsilateral shoulder  We will  Move the device and possibly the lead suture sleeves as well  We have reviewed the benefits and risks of generator replacement.  These include but are not limited to lead fracture and infection.  The patient understands, agrees and is willing to proceed.

## 2013-12-08 NOTE — Op Note (Signed)
[name], Kayla Harrison          ACCOUNT NO.:  [id number]  MEDICAL RECORD NO.:  [id number]  LOCATION:  [facility]                         FACILITY:  [place]  PHYSICIAN:  Deboraha Sprang, MD, FACCDATE OF BIRTH:  [birth year]  DATE OF PROCEDURE:  12/08/2013 DATE OF DISCHARGE:                              OPERATIVE REPORT   PREOPERATIVE DIAGNOSES: 1. Previously implanted pacemaker with impingement. 2. Ipsilateral shoulder movement.  POSTOPERATIVE DIAGNOSIS: 1. Previously implanted pacemaker with impingement. 2. Ipsilateral shoulder movement.  PROCEDURE:  Pocket revision.  Following obtaining informed consent, the patient was brought to the electrophysiology laboratory and placed on the fluoroscopic table in supine position.  A fluoroscopic image of the device was obtained.  After routine prep and drape, lidocaine was infiltrated translinear to the prior incision and carried down to layer of the device pocket.  The device was freed up and explanted.  The leads that had become fixed between the device and the shoulder were also freed up.  The pocket was extended medially and caudally.  The leads were checked with stable parameters in the atrial and ventricular leads with P-waves of 0.7, impedance of 505, a threshold 0.7 at 0.5.  The R-wave was 9.6, impedance of 451, threshold 1 V at 0.5.  With these acceptable parameters recorded, the leads were attached back to the pulse generator, and the pocket was copiously irrigated with antibiotic containing saline solution.  Hemostasis was assured.  Surgicel was placed in the inferolateral and cephalad aspect of the pocket.  The device was placed back in the pocket and secured to the prepectoral fascia in a medial location.  The wound was then closed in 2 layers in normal fashion.  The patient tolerated the procedure without apparent complication.     Deboraha Sprang, MD, [facility]     SCK/MEDQ  D:  12/08/2013  T:  12/08/2013  Job:  [id number]

## 2013-12-08 NOTE — Discharge Instructions (Signed)
Pacemaker Battery Change, Care After  Refer to this sheet in the next few weeks. These instructions provide you with information on caring for yourself after your procedure. Your health care provider may also give you more specific instructions. Your treatment has been planned according to current medical practices, but problems sometimes occur. Call your health care provider if you have any problems or questions after your procedure. WHAT TO EXPECT AFTER THE PROCEDURE After your procedure, it is typical to have the following sensations:  Soreness at the pacemaker site. HOME CARE INSTRUCTIONS   Keep the incision clean and dry.  Unless advised otherwise, you may shower beginning 48 hours after your procedure.  For the first week after the replacement, avoid stretching motions that pull at the incision site and avoid heavy exercise with the arm on the same side as the incision.  Only take over-the-counter or prescription medicines for pain, discomfort, or fever as directed by your health care provider.  Your health care provider will tell you when you will need to next test your pacemaker by telephone or when to return to the office for follow up for removal of stitches. SEEK MEDICAL CARE IF:   You have pain at the incision site that is not relieved by over-the-counter or prescription medicine.  There is drainage or pus from the incision site.  There is swelling larger than a lime at the incision site.  You develop red streaking that extends above or below the incision site.  You feel brief, intermittent palpitations, lightheadedness, or any symptoms that you feel might be related to your heart. SEEK IMMEDIATE MEDICAL CARE IF:   You experience chest pain that is different than the pain at the pacemaker site.  Shortness of breath.  Palpitations or irregular heart beat.  Lightheadedness that does not go away quickly.  Fainting.  You have pain that gets worse and is not relieved by  medicine. MAKE SURE YOU:   Understand these instructions.  Will watch your condition.  Will get help right away if you are not doing well or get worse. Document Released: 06/30/2013 Document Reviewed: 03/24/2013 [facility] Patient Information 2014 [place], [place].

## 2013-12-08 NOTE — Interval H&P Note (Signed)
History and Physical Interval Note:  12/08/2013 7:59 AM  Kayla Harrison  has presented today for surgery, with the diagnosis of Afib  The various methods of treatment have been discussed with the patient and family. After consideration of risks, benefits and other options for treatment, the patient has consented to  Procedure(s): POCKET REVISION (N/A) as a surgical intervention .  The patient's history has been reviewed, patient examined, no change in status, stable for surgery.  I have reviewed the patient's chart and labs.  Questions were answered to the patient's satisfaction.     [name]

## 2013-12-11 ENCOUNTER — Telehealth: Payer: Self-pay | Admitting: Physician Assistant

## 2013-12-11 NOTE — Telephone Encounter (Signed)
Pt had device pocket revision 12/08/13 to move medially a prev implanted device which constrains shoulder movements. Still having some residual pain right over the pacemaker. Sutures in tact, no significant redness or swelling. Quite uncomfortable for pt. D/w Dr. Caryl Comes. Instructed pt to come by office on Monday to take a look at it, OK for short term pain rx. Faxed in handwritten Tramadol 50mg  PO BID PRN mod pain disp #8 with [facility] RF [facility] [address]. No pain med allergies. Instructed pt to call back if sx worsen or change. Pt verbalized understanding and gratitude. Dayna Dunn PA-C

## 2013-12-16 ENCOUNTER — Ambulatory Visit (INDEPENDENT_AMBULATORY_CARE_PROVIDER_SITE_OTHER): Payer: Medicare Other | Admitting: *Deleted

## 2013-12-16 ENCOUNTER — Ambulatory Visit (INDEPENDENT_AMBULATORY_CARE_PROVIDER_SITE_OTHER): Payer: Medicare Other | Admitting: Pharmacist

## 2013-12-16 DIAGNOSIS — I4891 Unspecified atrial fibrillation: Secondary | ICD-10-CM

## 2013-12-16 DIAGNOSIS — I6350 Cerebral infarction due to unspecified occlusion or stenosis of unspecified cerebral artery: Secondary | ICD-10-CM | POA: Diagnosis not present

## 2013-12-16 DIAGNOSIS — Z5181 Encounter for therapeutic drug level monitoring: Secondary | ICD-10-CM

## 2013-12-16 DIAGNOSIS — Z7901 Long term (current) use of anticoagulants: Secondary | ICD-10-CM | POA: Diagnosis not present

## 2013-12-16 LAB — MDC_IDC_ENUM_SESS_TYPE_INCLINIC
Battery Voltage: 3.03 V
Brady Statistic AP VP Percent: 0 %
Brady Statistic AS VP Percent: 0.04 %
Brady Statistic RA Percent Paced: 0.02 %
Date Time Interrogation Session: 20150326104036
Lead Channel Impedance Value: 361 Ohm
Lead Channel Impedance Value: 456 Ohm
Lead Channel Impedance Value: 513 Ohm
Lead Channel Pacing Threshold Amplitude: 0.875 V
Lead Channel Pacing Threshold Pulse Width: 0.4 ms
Lead Channel Sensing Intrinsic Amplitude: 1.375 mV
Lead Channel Setting Pacing Amplitude: 2.5 V
Lead Channel Setting Pacing Amplitude: 2.75 V
Lead Channel Setting Sensing Sensitivity: 0.9 mV
MDC IDC MSMT BATTERY REMAINING LONGEVITY: 126 mo
MDC IDC MSMT LEADCHNL RA IMPEDANCE VALUE: 361 Ohm
MDC IDC MSMT LEADCHNL RA PACING THRESHOLD PULSEWIDTH: 0.4 ms
MDC IDC MSMT LEADCHNL RA SENSING INTR AMPL: 1.125 mV
MDC IDC MSMT LEADCHNL RV PACING THRESHOLD AMPLITUDE: 1.375 V
MDC IDC MSMT LEADCHNL RV SENSING INTR AMPL: 10.125 mV
MDC IDC MSMT LEADCHNL RV SENSING INTR AMPL: 12.375 mV
MDC IDC SET LEADCHNL RV PACING PULSEWIDTH: 0.4 ms
MDC IDC SET ZONE DETECTION INTERVAL: 350 ms
MDC IDC STAT BRADY AP VS PERCENT: 0.01 %
MDC IDC STAT BRADY AS VS PERCENT: 99.95 %
MDC IDC STAT BRADY RV PERCENT PACED: 0.04 %
Zone Setting Detection Interval: 400 ms

## 2013-12-16 LAB — POCT INR: INR: 2

## 2013-12-16 NOTE — Progress Notes (Signed)
Wound check appointment for pocket revision. No steri-strips, surgical glue present. Wound without redness or edema. Incision edges approximated, wound well healed. Normal device function. Thresholds, sensing, and impedances consistent with implant measurements. Device programmed at chronic lead settings. Histogram distribution appropriate for patient and level of activity. No mode switches + coumadin. No high ventricular rates noted. Patient educated about wound care, arm mobility, lifting restrictions.   ROV w/ Dr. Caryl Comes 03/17/14 @ 2:15.

## 2013-12-17 ENCOUNTER — Encounter: Payer: Self-pay | Admitting: Internal Medicine

## 2013-12-20 ENCOUNTER — Telehealth: Payer: Self-pay | Admitting: *Deleted

## 2013-12-20 NOTE — Telephone Encounter (Deleted)
Refill for pravachol sent to

## 2013-12-21 NOTE — Telephone Encounter (Signed)
error 

## 2013-12-23 MED ORDER — PRAVASTATIN SODIUM 40 MG PO TABS: 40.0000 mg | ORAL_TABLET | Freq: Every day | ORAL | Status: DC

## 2013-12-23 NOTE — Telephone Encounter (Signed)
Rx sent to the pharmacy by e-script.//AB/CMA 

## 2014-01-06 ENCOUNTER — Ambulatory Visit (INDEPENDENT_AMBULATORY_CARE_PROVIDER_SITE_OTHER): Payer: Medicare Other | Admitting: *Deleted

## 2014-01-06 DIAGNOSIS — Z7901 Long term (current) use of anticoagulants: Secondary | ICD-10-CM | POA: Diagnosis not present

## 2014-01-06 DIAGNOSIS — I4891 Unspecified atrial fibrillation: Secondary | ICD-10-CM | POA: Diagnosis not present

## 2014-01-06 DIAGNOSIS — I6350 Cerebral infarction due to unspecified occlusion or stenosis of unspecified cerebral artery: Secondary | ICD-10-CM | POA: Diagnosis not present

## 2014-01-06 DIAGNOSIS — Z5181 Encounter for therapeutic drug level monitoring: Secondary | ICD-10-CM | POA: Diagnosis not present

## 2014-01-06 LAB — POCT INR: INR: 3.7

## 2014-01-18 ENCOUNTER — Ambulatory Visit (INDEPENDENT_AMBULATORY_CARE_PROVIDER_SITE_OTHER): Payer: Medicare Other | Admitting: *Deleted

## 2014-01-18 DIAGNOSIS — Z5181 Encounter for therapeutic drug level monitoring: Secondary | ICD-10-CM | POA: Diagnosis not present

## 2014-01-18 DIAGNOSIS — I4891 Unspecified atrial fibrillation: Secondary | ICD-10-CM

## 2014-01-18 DIAGNOSIS — Z7901 Long term (current) use of anticoagulants: Secondary | ICD-10-CM | POA: Diagnosis not present

## 2014-01-18 DIAGNOSIS — I6350 Cerebral infarction due to unspecified occlusion or stenosis of unspecified cerebral artery: Secondary | ICD-10-CM

## 2014-01-18 LAB — POCT INR: INR: 2.3

## 2014-01-19 ENCOUNTER — Other Ambulatory Visit: Payer: Self-pay | Admitting: Internal Medicine

## 2014-01-28 ENCOUNTER — Encounter: Payer: Self-pay | Admitting: Internal Medicine

## 2014-02-08 ENCOUNTER — Ambulatory Visit (INDEPENDENT_AMBULATORY_CARE_PROVIDER_SITE_OTHER): Payer: Medicare Other | Admitting: Pharmacist

## 2014-02-08 DIAGNOSIS — E890 Postprocedural hypothyroidism: Secondary | ICD-10-CM | POA: Diagnosis not present

## 2014-02-08 DIAGNOSIS — E349 Endocrine disorder, unspecified: Secondary | ICD-10-CM | POA: Diagnosis not present

## 2014-02-08 DIAGNOSIS — Z7901 Long term (current) use of anticoagulants: Secondary | ICD-10-CM

## 2014-02-08 DIAGNOSIS — I4891 Unspecified atrial fibrillation: Secondary | ICD-10-CM

## 2014-02-08 DIAGNOSIS — Z6838 Body mass index (BMI) 38.0-38.9, adult: Secondary | ICD-10-CM | POA: Diagnosis not present

## 2014-02-08 DIAGNOSIS — C50919 Malignant neoplasm of unspecified site of unspecified female breast: Secondary | ICD-10-CM | POA: Diagnosis not present

## 2014-02-08 DIAGNOSIS — E785 Hyperlipidemia, unspecified: Secondary | ICD-10-CM | POA: Diagnosis not present

## 2014-02-08 DIAGNOSIS — Z5181 Encounter for therapeutic drug level monitoring: Secondary | ICD-10-CM

## 2014-02-08 DIAGNOSIS — I6350 Cerebral infarction due to unspecified occlusion or stenosis of unspecified cerebral artery: Secondary | ICD-10-CM

## 2014-02-08 DIAGNOSIS — C73 Malignant neoplasm of thyroid gland: Secondary | ICD-10-CM | POA: Diagnosis not present

## 2014-02-08 LAB — POCT INR: INR: 2.6

## 2014-02-23 DIAGNOSIS — M25579 Pain in unspecified ankle and joints of unspecified foot: Secondary | ICD-10-CM | POA: Diagnosis not present

## 2014-02-23 DIAGNOSIS — M79609 Pain in unspecified limb: Secondary | ICD-10-CM | POA: Diagnosis not present

## 2014-02-23 DIAGNOSIS — M659 Synovitis and tenosynovitis, unspecified: Secondary | ICD-10-CM | POA: Diagnosis not present

## 2014-02-23 DIAGNOSIS — M7150 Other bursitis, not elsewhere classified, unspecified site: Secondary | ICD-10-CM | POA: Diagnosis not present

## 2014-02-23 DIAGNOSIS — M65979 Unspecified synovitis and tenosynovitis, unspecified ankle and foot: Secondary | ICD-10-CM | POA: Diagnosis not present

## 2014-02-24 ENCOUNTER — Ambulatory Visit (INDEPENDENT_AMBULATORY_CARE_PROVIDER_SITE_OTHER): Payer: Medicare Other | Admitting: Pharmacist Clinician (PhC)/ Clinical Pharmacy Specialist

## 2014-02-24 DIAGNOSIS — Z7901 Long term (current) use of anticoagulants: Secondary | ICD-10-CM | POA: Diagnosis not present

## 2014-02-24 DIAGNOSIS — I4891 Unspecified atrial fibrillation: Secondary | ICD-10-CM

## 2014-02-24 DIAGNOSIS — Z5181 Encounter for therapeutic drug level monitoring: Secondary | ICD-10-CM

## 2014-02-24 DIAGNOSIS — I6350 Cerebral infarction due to unspecified occlusion or stenosis of unspecified cerebral artery: Secondary | ICD-10-CM

## 2014-02-24 LAB — POCT INR: INR: 2.9

## 2014-02-24 IMAGING — US US BREAST L
1 series · 1 of 1 positions shown · non-contrast
Comparison: 07/23/2011, 07/18/2010, dating back to 05/12/2008.

CLINICAL DATA: Malignant lumpectomy of the left breast in 2272,
pathology revealing DCIS, with subsequent radiation therapy.
Annual reevaluation.  The patient complains of a palpable lump in
the upper left breast.

DIGITAL DIAGNOSTIC BILATERAL MAMMOGRAM WITH CAD AND LEFT BREAST
ULTRASOUND:

[Series 1: us breast left · 1 of 1 slices shown]
[im 1/1]
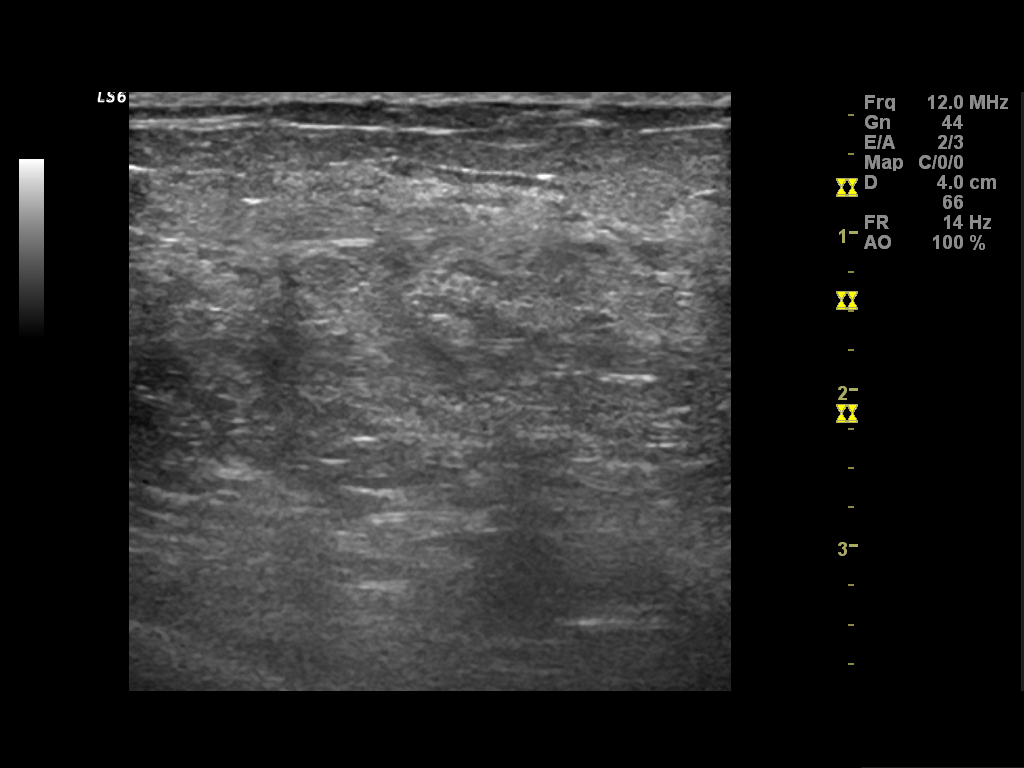

[1 of 1 positions shown; findings below may reference images not displayed]

FINDINGS: CC and MLO views of both breasts, a spot tangential view
of the left breast at the lumpectomy site, and a spot tangential
view in the area of palpable concern in the upper left breast were
obtained.  Heterogeneously dense breast tissue, unchanged.  Post
lumpectomy scarring in the lower inner left breast, unchanged.
Scattered benign appearing calcifications in both breasts.  No new
suspicious mass, nonsurgical architectural distortion, or
suspicious calcifications in either breast.
Mammographic images were processed with CAD.

On physical exam, I palpate no discrete mass in the upper left
breast in the area of palpable concern and tenderness per patient.
The patient did describe tenderness to palpation.

Ultrasound is performed, showing normal fibroglandular tissue in
the area of palpable concern in the 11 o'clock position of the left
breast, approximately 2 cm from the nipple.  No cyst, solid mass,
or abnormal acoustic shadowing was identified.
IMPRESSION: 1.  No mammographic or sonographic evidence of malignancy, left
breast.  Expected evolutionary changes of post lumpectomy scarring
in the lower inner left breast.
2.  No mammographic evidence of malignancy, right breast.

RECOMMENDATION:
Bilateral diagnostic mammography in 1 year.

The patient was encouraged to perform monthly self breast
examination and communicate any changes with her primary physician.
I have discussed the findings and recommendations with the patient.
Results were also provided in writing at the conclusion of the
visit.

BI-RADS CATEGORY 2:  Benign finding(s).

## 2014-02-25 DIAGNOSIS — H2510 Age-related nuclear cataract, unspecified eye: Secondary | ICD-10-CM | POA: Diagnosis not present

## 2014-02-25 DIAGNOSIS — H02839 Dermatochalasis of unspecified eye, unspecified eyelid: Secondary | ICD-10-CM | POA: Diagnosis not present

## 2014-02-25 DIAGNOSIS — H18419 Arcus senilis, unspecified eye: Secondary | ICD-10-CM | POA: Diagnosis not present

## 2014-03-02 ENCOUNTER — Telehealth: Payer: Self-pay | Admitting: *Deleted

## 2014-03-02 ENCOUNTER — Other Ambulatory Visit: Payer: Self-pay | Admitting: Internal Medicine

## 2014-03-02 NOTE — Telephone Encounter (Signed)
Faxed clearance to [facility] for cataract extraction with intraocular lens implantation of the right eye followed by the left eye.  Sent recent EKG & office visit with fax

## 2014-03-03 DIAGNOSIS — M76829 Posterior tibial tendinitis, unspecified leg: Secondary | ICD-10-CM | POA: Diagnosis not present

## 2014-03-17 ENCOUNTER — Ambulatory Visit (INDEPENDENT_AMBULATORY_CARE_PROVIDER_SITE_OTHER): Payer: Medicare Other | Admitting: *Deleted

## 2014-03-17 ENCOUNTER — Ambulatory Visit (INDEPENDENT_AMBULATORY_CARE_PROVIDER_SITE_OTHER): Payer: Medicare Other | Admitting: Internal Medicine

## 2014-03-17 ENCOUNTER — Encounter: Payer: Self-pay | Admitting: Internal Medicine

## 2014-03-17 VITALS — BP 138/81 | HR 69 | Ht 66.0 in | Wt 234.0 lb

## 2014-03-17 DIAGNOSIS — I6350 Cerebral infarction due to unspecified occlusion or stenosis of unspecified cerebral artery: Secondary | ICD-10-CM

## 2014-03-17 DIAGNOSIS — Z7901 Long term (current) use of anticoagulants: Secondary | ICD-10-CM

## 2014-03-17 DIAGNOSIS — Z5181 Encounter for therapeutic drug level monitoring: Secondary | ICD-10-CM

## 2014-03-17 DIAGNOSIS — I4891 Unspecified atrial fibrillation: Secondary | ICD-10-CM

## 2014-03-17 LAB — MDC_IDC_ENUM_SESS_TYPE_INCLINIC
Battery Remaining Longevity: 120 mo
Battery Voltage: 3.03 V
Brady Statistic AP VP Percent: 0 %
Brady Statistic RA Percent Paced: 0.02 %
Date Time Interrogation Session: 20150625184145
Lead Channel Impedance Value: 380 Ohm
Lead Channel Impedance Value: 532 Ohm
Lead Channel Pacing Threshold Amplitude: 0.75 V
Lead Channel Pacing Threshold Amplitude: 1.625 V
Lead Channel Pacing Threshold Pulse Width: 0.4 ms
Lead Channel Sensing Intrinsic Amplitude: 0.875 mV
Lead Channel Sensing Intrinsic Amplitude: 1.375 mV
Lead Channel Setting Pacing Amplitude: 2 V
Lead Channel Setting Pacing Amplitude: 2.5 V
Lead Channel Setting Pacing Pulse Width: 0.4 ms
MDC IDC MSMT LEADCHNL RA PACING THRESHOLD PULSEWIDTH: 0.4 ms
MDC IDC MSMT LEADCHNL RV IMPEDANCE VALUE: 380 Ohm
MDC IDC MSMT LEADCHNL RV IMPEDANCE VALUE: 437 Ohm
MDC IDC MSMT LEADCHNL RV SENSING INTR AMPL: 10.375 mV
MDC IDC MSMT LEADCHNL RV SENSING INTR AMPL: 13.375 mV
MDC IDC SET LEADCHNL RV SENSING SENSITIVITY: 0.9 mV
MDC IDC SET ZONE DETECTION INTERVAL: 350 ms
MDC IDC STAT BRADY AP VS PERCENT: 0.01 %
MDC IDC STAT BRADY AS VP PERCENT: 0.04 %
MDC IDC STAT BRADY AS VS PERCENT: 99.95 %
MDC IDC STAT BRADY RV PERCENT PACED: 0.04 %
Zone Setting Detection Interval: 400 ms

## 2014-03-17 LAB — POCT INR: INR: 2.7

## 2014-03-17 MED ORDER — AMIODARONE HCL 200 MG PO TABS: ORAL_TABLET | ORAL | Status: DC

## 2014-03-17 NOTE — Patient Instructions (Addendum)
Your physician recommends that you schedule a follow-up appointment in: 4 months with Dr Caryl Comes      Your physician has recommended you make the following change in your medication:  1) Take Amiodarone to 200mg  5 days a week Mon-Fri

## 2014-03-17 NOTE — Progress Notes (Signed)
Patient Care Team: Hendricks Limes, MD as PCP - General   HPI  Kayla Harrison is a 68 y.o. female Seen in followup for a device revision undertaken in march because of impingement of the device up against teh clavicle  She has atrial fibrillation for which she underwent pulmonary vein isolation at [facility] 2009 She's had problems with recurrent atrial fibrillation. She underwent convergent ablation at [facility] 3/47; it was complicated by heart block prompting pacemaker implantation.     She has a history of prior strokes and remains on oral anticoagulation .  Echo 2011 EF 55% with mod LAE-54mm  LV function was normal Echocardiogram post ablation demonstrated normal left ventricular function and without effusion.  She also has a history of morbid obesity status post lap band surgery    Past Medical History  Diagnosis Date  . Morbid obesity     Status post lap band surgery  . Hx of thyroid cancer     Dr Forde Dandy  . Colon polyp     Dr Earlean Shawl  . Dyslipidemia   . Hyperlipidemia   . Hypothyroidism   . Breast cancer     Dr Margot Chimes, total thyroidectomy- 1999- for cancer  . Pneumonia 2010    [facility]- during that event had to be cardioverted   . Arthritis     osteoarthritis - knees   . Normal cardiac stress test     2008- cardiac stress/echo  . Stroke     2003- [place]   . Atrial fibrillation     ablation- 2x's-- 1st time- [facility], 2nd event at [facility] in 2008. Convergent ablation at [facility] 6/14  . Complication of anesthesia     Ketamine produces LSD reaction, bright colored nightmarish experience   . Hepatitis     [place]   . Sinus node dysfunction     Complicating convergent ablation 6/14  . Pacemaker-Medtronic     Past Surgical History  Procedure Laterality Date  . Thyroidectomy  1998    Dr Margot Chimes  . Bso  1998  . Cholecystectomy    . Tonsillectomy    . Colonoscopy w/ polypectomy      Dr Earlean Shawl  . Afib ablation      X 2;  [facility] & Dr Caryl Comes  . Abdominal hysterectomy  1983  . Breast lumpectomy  2010  . Laparoscopic gastric banding  07/10/2010  . Appendectomy    . Knee arthroscopy      bilateral  . Laparotomy      for ruptured ovary and ovarian artery   . Total knee arthroplasty  04/13/2012    Procedure: TOTAL KNEE ARTHROPLASTY;  Surgeon: Rudean Haskell, MD;  Location: [place];  Service: Orthopedics;  Laterality: Right;  . Joint replacement  04/13/12    Right Knee replacement  . Cardioversion  10/09/2012    Procedure: CARDIOVERSION;  Surgeon: Minus Breeding, MD;  Location: [place];  Service: Cardiovascular;  Laterality: N/A;  . Cardioversion  10/09/2012    Procedure: CARDIOVERSION;  Surgeon: Minus Breeding, MD;  Location: [facility] ENDOSCOPY;  Service: Cardiovascular;  Laterality: N/A;  [name] gave the ok to add pt to the add on , but we must check to find out if the can add pt on at [phone] ( [phone])  . Cardioversion N/A 11/20/2012    Procedure: CARDIOVERSION;  Surgeon: Fay Records, MD;  Location: [place];  Service: Cardiovascular;  Laterality: N/A;  .  Transabdominal ablation  03/02/13    [facility]  . Pacemaker insertion  03/10/13    [facility]  . Convergent      Current Outpatient Prescriptions  Medication Sig Dispense Refill  . amiodarone (PACERONE) 200 MG tablet Take 1 tablet (200 mg total) by mouth daily.  30 tablet  6  . BIOTIN PO Take 1 tablet by mouth daily.       . Calcium Citrate-Vitamin D (CALCIUM CITRATE + PO) Take 1 tablet by mouth daily.      . furosemide (LASIX) 40 MG tablet TAKE 1 TABLET BY MOUTH EVERY DAY AS DIRECTED  30 tablet  1  . levothyroxine (SYNTHROID, LEVOTHROID) 175 MCG tablet Take 175 mcg by mouth daily before breakfast. TAKE 1 TABLET BY MOUTH EVERY DAY EXCEPT ON SAT.      . Multiple Vitamin (MULTIVITAMIN) tablet Take 2 tablets by mouth daily.       . pravastatin (PRAVACHOL) 40 MG tablet Take 1 tablet (40 mg total) by mouth daily.  90 tablet  1  . TAURINE PO Take 1 tablet by mouth daily.       Marland Kitchen  warfarin (COUMADIN) 2.5 MG tablet Take 2.5 mg by mouth daily. Sunday, Tuesday, Thursday, Saturday      . warfarin (COUMADIN) 5 MG tablet 2.5mg  daily except 5mg  on Mondays, Wednesdays, and Fridays or as directed by coumadin clinic  35 tablet  3   No current facility-administered medications for this visit.    Allergies  Allergen Reactions  . Tikosyn [Dofetilide]     Prolonged QT interval Prolonged QT interval  . Xarelto [Rivaroxaban]     Nose Bleed X 6 hrs ; packing in ER  . Epinephrine Other (See Comments) and Rash    Dental form only (liquid). Patient stated she will become "out of it" she can hear you but cannot respond in normal fashion.  Marland Kitchen Ketamine     Hallucinations    Review of Systems negative except from HPI and PMH  Physical Exam BP 138/81  Pulse 69  Ht 5\' 6"  (1.676 m)  Wt 234 lb (106.142 kg)  BMI 37.79 kg/m2 Well developed and well nourished in no acute distress HENT normal E scleral and icterus clear Neck Supple JVP flat; carotids brisk and full Clear to ausculation  Regular rate and rhythm, no murmurs gallops or rub Soft with active bowel sounds No clubbing cyanosis   Edema Alert and oriented, grossly normal motor and sensory function Skin Warm and Dry    Assessment and  Plan  Atrial fibrillation  Pacemaker  Doing well following device revision. There is infrequent atrial fibrillation. There is improved exercise activity.

## 2014-03-21 ENCOUNTER — Encounter: Payer: Self-pay | Admitting: Family Medicine

## 2014-03-21 ENCOUNTER — Ambulatory Visit (INDEPENDENT_AMBULATORY_CARE_PROVIDER_SITE_OTHER): Payer: Medicare Other | Admitting: Family Medicine

## 2014-03-21 VITALS — BP 112/74 | HR 66 | Temp 98.6°F | Wt 239.0 lb

## 2014-03-21 DIAGNOSIS — I6350 Cerebral infarction due to unspecified occlusion or stenosis of unspecified cerebral artery: Secondary | ICD-10-CM | POA: Diagnosis not present

## 2014-03-21 DIAGNOSIS — B029 Zoster without complications: Secondary | ICD-10-CM | POA: Diagnosis not present

## 2014-03-21 MED ORDER — GABAPENTIN 300 MG PO CAPS: ORAL_CAPSULE | ORAL | Status: DC

## 2014-03-21 NOTE — Progress Notes (Signed)
Subjective:     Kayla Harrison is a 68 y.o. female who presents for evaluation of a rash involving the chest. Rash started 2 days ago. Lesions are pink, and blistering in texture. Rash has resolved over time. Rash is painful. Associated symptoms: none. Patient denies: abdominal pain, arthralgia, congestion, cough, crankiness, decrease in appetite, decrease in energy level, fever, headache, irritability, myalgia, nausea, sore throat and vomiting. Patient has not had contacts with similar rash. Patient has not had new exposures (soaps, lotions, laundry detergents, foods, medications, plants, insects or animals).  Pt called on call Dr Estell Harpin called in.   The following portions of the patient's history were reviewed and updated as appropriate:  She  has a past medical history of Morbid obesity; thyroid cancer; Colon polyp; Dyslipidemia; Hyperlipidemia; Hypothyroidism; Breast cancer; Pneumonia (2010); Arthritis; Normal cardiac stress test; Stroke; Atrial fibrillation; Complication of anesthesia; Hepatitis; Sinus node dysfunction; and Pacemaker-Medtronic. She  does not have any pertinent problems on file. She  has past surgical history that includes Thyroidectomy (1998); bso (1998); Cholecystectomy; Tonsillectomy; Colonoscopy w/ polypectomy; afib ablation; Abdominal hysterectomy (1983); Breast lumpectomy (2010); Laparoscopic gastric banding (07/10/2010); Appendectomy; Knee arthroscopy; laparotomy; Total knee arthroplasty (04/13/2012); Joint replacement (04/13/12); Cardioversion (10/09/2012); Cardioversion (10/09/2012); Cardioversion (N/A, 11/20/2012); Transabdominal ablation (03/02/13); Pacemaker insertion (03/10/13); and convergent. Her family history includes Cancer in her father; Diabetes in her brother, paternal aunt, paternal grandmother, and sister; Heart disease (age of onset: 16) in her father. She  reports that she has never smoked. She has never used smokeless tobacco. She reports that she does not  drink alcohol or use illicit drugs. She has a current medication list which includes the following prescription(s): amiodarone, biotin, calcium citrate-vitamin d, furosemide, levothyroxine, multivitamin, pravastatin, taurine, warfarin, warfarin, and gabapentin. Current Outpatient Prescriptions on File Prior to Visit  Medication Sig Dispense Refill  . amiodarone (PACERONE) 200 MG tablet Take 1 daily Mon- Fri  30 tablet  6  . BIOTIN PO Take 1 tablet by mouth daily.       . Calcium Citrate-Vitamin D (CALCIUM CITRATE + PO) Take 1 tablet by mouth daily.      . furosemide (LASIX) 40 MG tablet TAKE 1 TABLET BY MOUTH EVERY DAY AS DIRECTED  30 tablet  1  . levothyroxine (SYNTHROID, LEVOTHROID) 175 MCG tablet Take 175 mcg by mouth daily before breakfast. TAKE 1 TABLET BY MOUTH EVERY DAY EXCEPT ON SAT.      . Multiple Vitamin (MULTIVITAMIN) tablet Take 2 tablets by mouth daily.       . pravastatin (PRAVACHOL) 40 MG tablet Take 1 tablet (40 mg total) by mouth daily.  90 tablet  1  . TAURINE PO Take 1 tablet by mouth daily.       Marland Kitchen warfarin (COUMADIN) 2.5 MG tablet Take 2.5 mg by mouth daily. Sunday, Tuesday, Thursday, Saturday      . warfarin (COUMADIN) 5 MG tablet 2.5mg  daily except 5mg  on Mondays, Wednesdays, and Fridays or as directed by coumadin clinic  35 tablet  3   No current facility-administered medications on file prior to visit.   She is allergic to tikosyn; xarelto; epinephrine; and ketamine..  Review of Systems Pertinent items are noted in HPI.    Objective:    BP 112/74  Pulse 66  Temp(Src) 98.6 F (37 C) (Oral)  Wt 239 lb (108.41 kg)  SpO2 96% General:  alert, cooperative, appears stated age and no distress  Skin:  normal  Pain L flank   Assessment:    shingles    Plan:    Medications: finish valtrex and start neurontin. Written patient instruction given. Follow up in a few days. --prn

## 2014-03-21 NOTE — Patient Instructions (Signed)
Shingles Shingles (herpes zoster) is an infection that is caused by the same virus that causes chickenpox (varicella). The infection causes a painful skin rash and fluid-filled blisters, which eventually break open, crust over, and heal. It may occur in any area of the body, but it usually affects only one side of the body or face. The pain of shingles usually lasts about 1 month. However, some people with shingles may develop long-term (chronic) pain in the affected area of the body. Shingles often occurs many years after the person had chickenpox. It is more common:  In people older than 50 years.  In people with weakened immune systems, such as those with HIV, AIDS, or cancer.  In people taking medicines that weaken the immune system, such as transplant medicines.  In people under great stress. CAUSES  Shingles is caused by the varicella zoster virus (VZV), which also causes chickenpox. After a person is infected with the virus, it can remain in the person's body for years in an inactive state (dormant). To cause shingles, the virus reactivates and breaks out as an infection in a nerve root. The virus can be spread from person to person (contagious) through contact with open blisters of the shingles rash. It will only spread to people who have not had chickenpox. When these people are exposed to the virus, they may develop chickenpox. They will not develop shingles. Once the blisters scab over, the person is no longer contagious and cannot spread the virus to others. SYMPTOMS  Shingles shows up in stages. The initial symptoms may be pain, itching, and tingling in an area of the skin. This pain is usually described as burning, stabbing, or throbbing.In a few days or weeks, a painful red rash will appear in the area where the pain, itching, and tingling were felt. The rash is usually on one side of the body in a band or belt-like pattern. Then, the rash usually turns into fluid-filled blisters. They  will scab over and dry up in approximately 2-3 weeks. Flu-like symptoms may also occur with the initial symptoms, the rash, or the blisters. These may include:  Fever.  Chills.  Headache.  Upset stomach. DIAGNOSIS  Your caregiver will perform a skin exam to diagnose shingles. Skin scrapings or fluid samples may also be taken from the blisters. This sample will be examined under a microscope or sent to a lab for further testing. TREATMENT  There is no specific cure for shingles. Your caregiver will likely prescribe medicines to help you manage the pain, recover faster, and avoid long-term problems. This may include antiviral drugs, anti-inflammatory drugs, and pain medicines. HOME CARE INSTRUCTIONS   Take a cool bath or apply cool compresses to the area of the rash or blisters as directed. This may help with the pain and itching.   Only take over-the-counter or prescription medicines as directed by your caregiver.   Rest as directed by your caregiver.  Keep your rash and blisters clean with mild soap and cool water or as directed by your caregiver.  Do not pick your blisters or scratch your rash. Apply an anti-itch cream or numbing creams to the affected area as directed by your caregiver.  Keep your shingles rash covered with a loose bandage (dressing).  Avoid skin contact with:  Babies.   Pregnant women.   Children with eczema.   Elderly people with transplants.   People with chronic illnesses, such as leukemia or AIDS.   Wear loose-fitting clothing to help ease the  pain of material rubbing against the rash.  Keep all follow-up appointments with your caregiver.If the area involved is on your face, you may receive a referral for follow-up to a specialist, such as an eye doctor (ophthalmologist) or an ear, nose, and throat (ENT) doctor. Keeping all follow-up appointments will help you avoid eye complications, chronic pain, or disability.  SEEK IMMEDIATE MEDICAL  CARE IF:   You have facial pain, pain around the eye area, or loss of feeling on one side of your face.  You have ear pain or ringing in your ear.  You have loss of taste.  Your pain is not relieved with prescribed medicines.   Your redness or swelling spreads.   You have more pain and swelling.  Your condition is worsening or has changed.   You have a feveror persistent symptoms for more than 2-3 days.  You have a fever and your symptoms suddenly get worse. MAKE SURE YOU:  Understand these instructions.  Will watch your condition.  Will get help right away if you are not doing well or get worse. Document Released: 09/09/2005 Document Revised: 06/03/2012 Document Reviewed: 04/23/2012 [facility] Patient Information 2015 [place], [place]. This information is not intended to replace advice given to you by your health care provider. Make sure you discuss any questions you have with your health care provider.

## 2014-03-21 NOTE — Progress Notes (Signed)
Pre visit review using our clinic review tool, if applicable. No additional management support is needed unless otherwise documented below in the visit note. 

## 2014-03-22 ENCOUNTER — Encounter: Payer: Medicare Other | Admitting: Internal Medicine

## 2014-04-04 ENCOUNTER — Telehealth: Payer: Self-pay | Admitting: *Deleted

## 2014-04-04 NOTE — Telephone Encounter (Signed)
[facility] Triage Call Report Triage Record Num: [id number] Operator: Prentice Docker [place] Patient Name: Kayla Harrison [facility] Call Date & Time: 03/19/2014 9:18:29PM Patient Phone: [phone] PCP: Patient Gender: Female PCP Fax : Patient DOB: 11/17/1945 Practice Name: [facility] Reason for Call: Caller: Kayla Harrison/Patient; PCP: Unice Cobble; CB#: [phone]; Call regarding Shingles; Onset 03/18/14 painful to sit with back againasst chair. Twitches in left quad. 1hr ago blisters and red rash between breast heavy concentration. No rash on her back yet. Is leaving for [place] July 8. Afebrile. Blisters have not broken open. Had shingles before on her back where her pain is. Pain 1-10=not sitting against anything 3-4. No swollen lymph nodes. Emergent S&S ruled out per Skin Lesions protocol: New onset of painful blisters without an obvious cause such as thermal or chemical burns." Pt requesting an antiviral medication. Contacted Dr Regis Bill and she gave VO for Valtrex 1gram TID x7 days and to follow up with office on Monday for appt. Called [facility] [phone] and spoke with pharmacist Tammy and order given as documented. Pt aware and verbalized understanding to be seen at office on Monday. Protocol(s) Used: Skin Lesions Recommended Outcome per Protocol: See Provider within 24 hours Reason for Outcome: New onset of painful blisters without an obvious cause such as thermal or chemical burns Care Advice: Avoid physical contact with anyone who has never had chickenpox or with pregnant women, newborns or immunocompromised individuals. Blisters are contagious until all break and are crusted. ~ ~ Take a cool bath/shower to relieve itching. Do not use hot water as this may aggravate itching. There is now a vaccine to prevent shingles. It is recommended that all adults over the age of 59 receive the vaccine, even if they have already had the shingles, as a few people may develop it a second  time. ~ See provider immediately if the blisters/rash is on the face, nose or eye area. Shingles of the eye can have serious complications including vision loss. ~ See provider within 4 hours if having signs and symptoms of local infection such as redness or red streaks, tenderness, warmth, swelling, or purulent drainage). ~ ~ Keep lesions clean using mild soap and water. Wash hands frequently. Do not touch, scratch, or break blisters. Intact blisters are thought to reduce pain and decrease chance of secondary infection. Cover blisters until they are crusted over. Apply cool wet compresses to the affected area for 10-15 minutes to help relieve pain and itching. ~ 03/19/2014 9:56:34PM Page 1 of 1 [id number]

## 2014-04-13 IMAGING — CT CT ABD-PELV W/O CM
1 series · 14 of 19 positions shown, 19 images · non-contrast
Comparison: 08/01/2006.

CLINICAL DATA: Left flank pain.

CT ABDOMEN AND PELVIS WITHOUT CONTRAST
TECHNIQUE: Multidetector CT imaging of the abdomen and pelvis was
performed following the standard protocol without intravenous
contrast.

[Series 4: lung · axial · 0.70mm/px · z∈[+1468,+1548]mm · 14 of 19 slices shown, 19 images]
[im 2/19  soft-tissue]
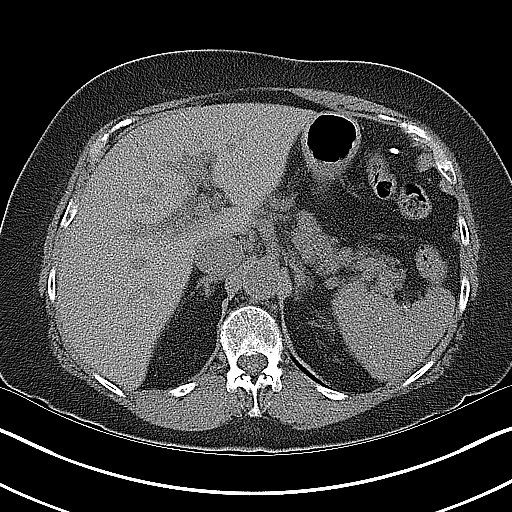
[im 2/19  bone]
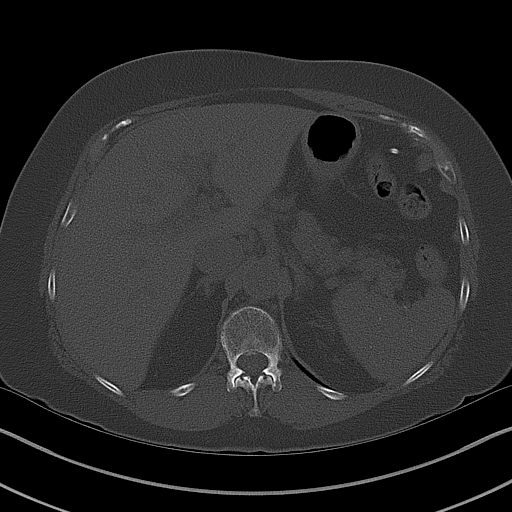
[im 3/19  soft-tissue]
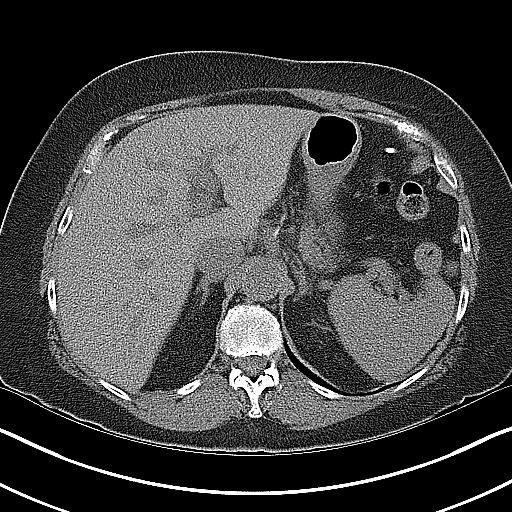
[im 5/19  soft-tissue]
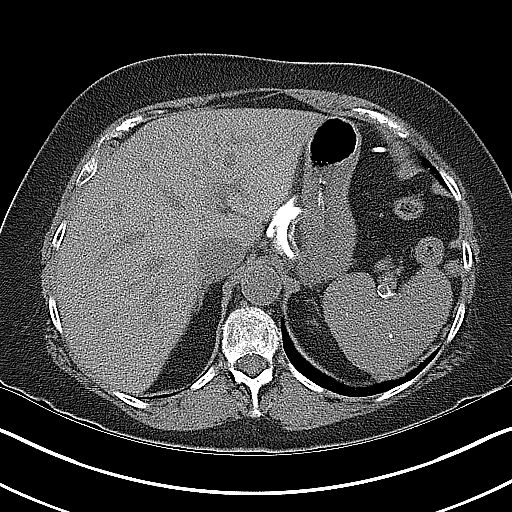
[im 6/19  soft-tissue]
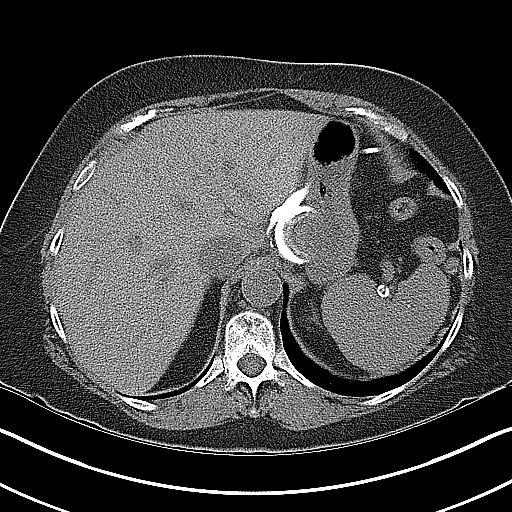
[im 7/19  soft-tissue]
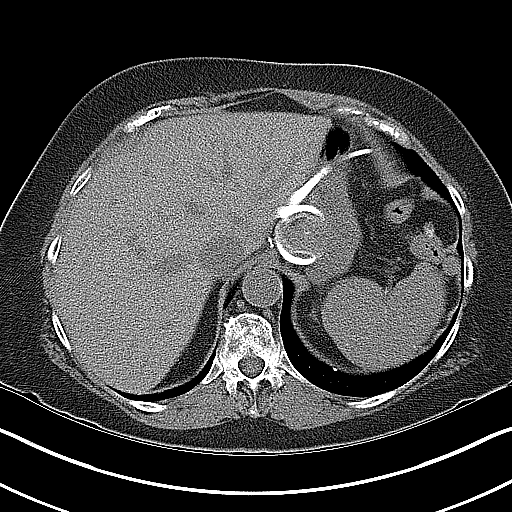
[im 9/19  soft-tissue]
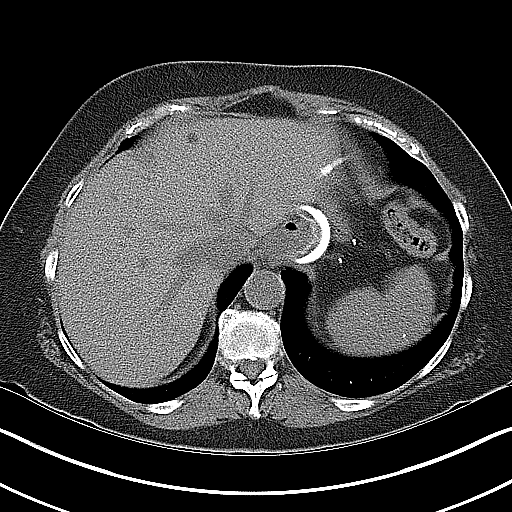
[im 10/19  soft-tissue]
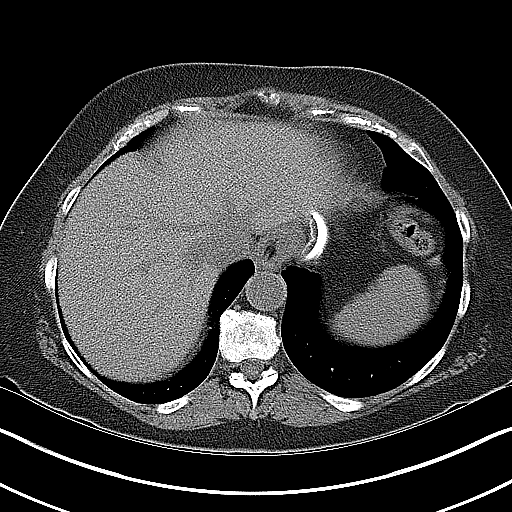
[im 11/19  soft-tissue]
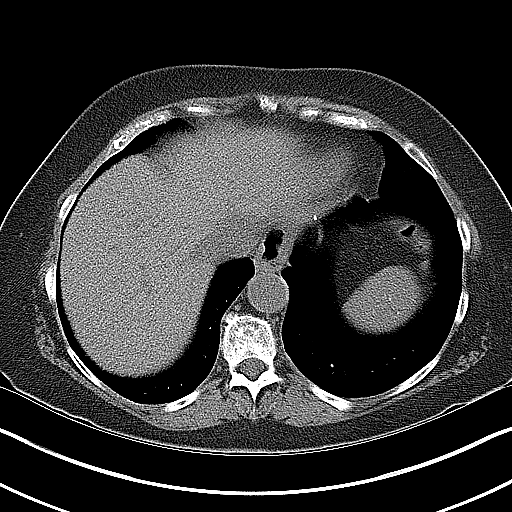
[im 13/19  soft-tissue]
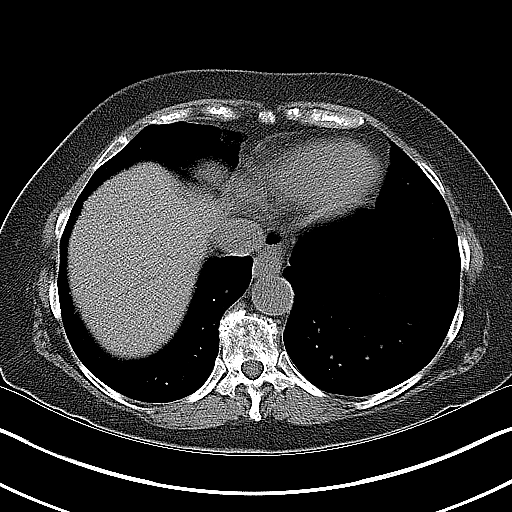
[im 13/19  bone]
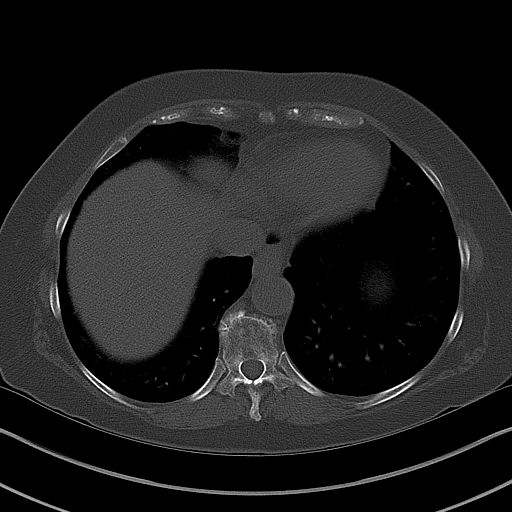
[im 14/19  soft-tissue]
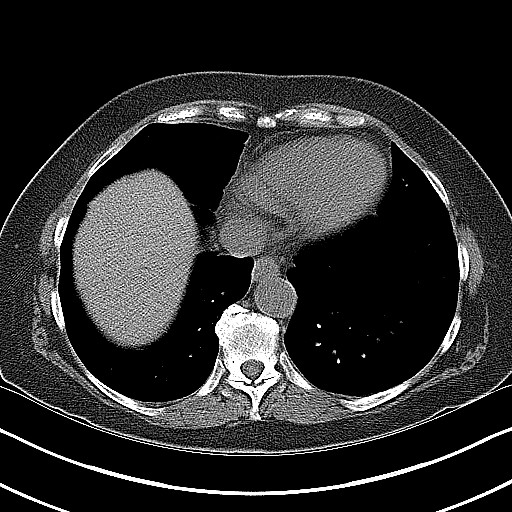
[im 15/19  soft-tissue]
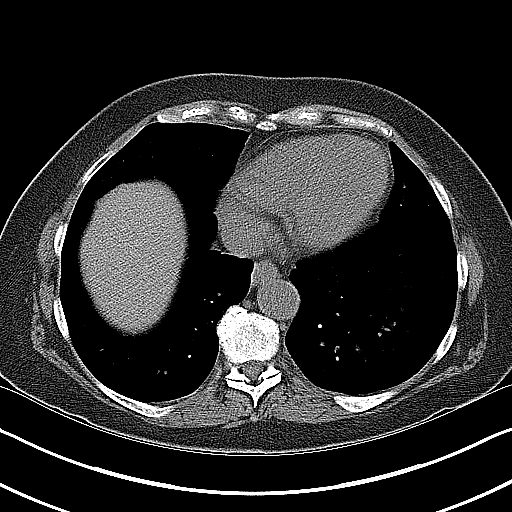
[im 15/19  lung]
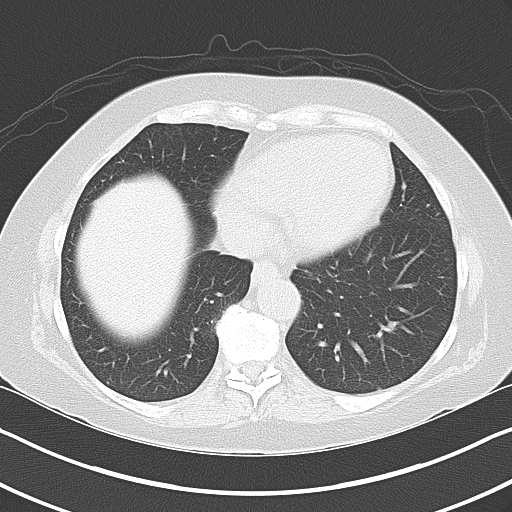
[im 16/19  lung]
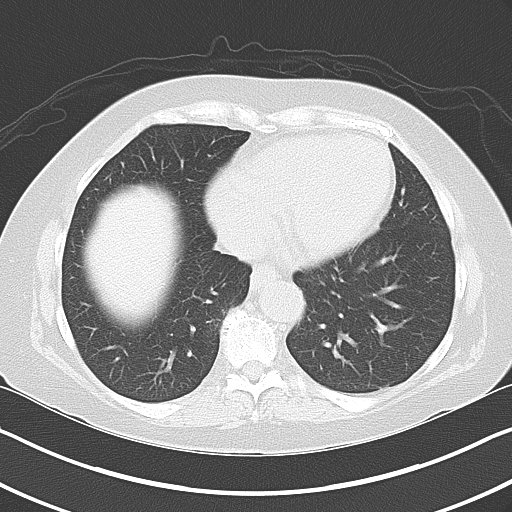
[im 17/19  soft-tissue]
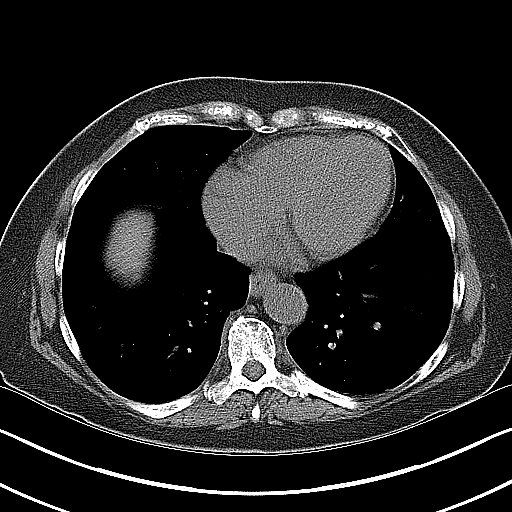
[im 17/19  lung]
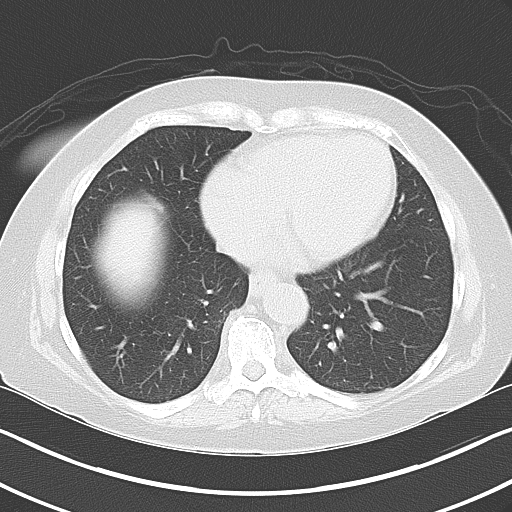
[im 18/19  soft-tissue]
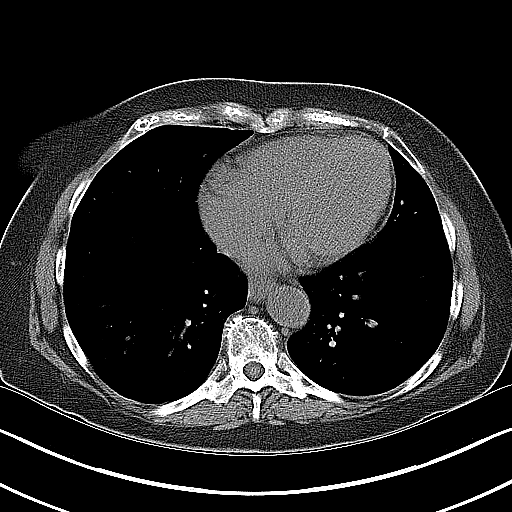
[im 18/19  lung]
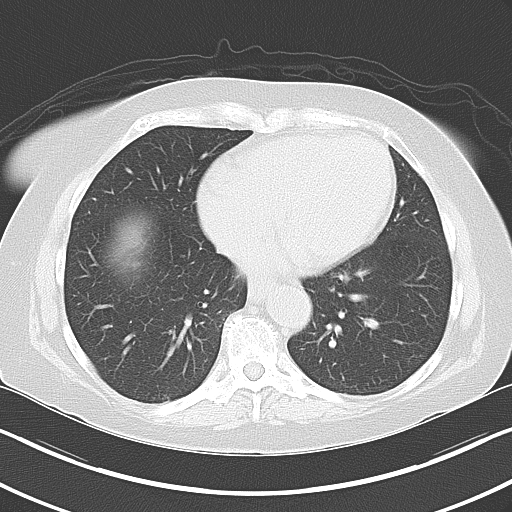

[14 of 19 positions shown; findings below may reference images not displayed]

FINDINGS: The lung bases are clear.  No pleural effusion or
pulmonary nodule.  The heart is normal in size.  No pericardial
effusion.

Gastric lap band is in good position with normal orientation.  No
complicating features.  The liver is unremarkable without contrast.
No worrisome hepatic lesions or intrahepatic biliary dilatation.  A
few small calcified granulomas are noted.  The common bile duct is
mildly dilated.  This is likely due to prior cholecystectomy.  The
pancreas is unremarkable.  The spleen demonstrates small scattered
calcified hemangiomas.  A small calcified splenic artery aneurysm
is noted.  The adrenal glands are normal.

There are multiple bilateral renal calculi.  There is a large,
x 9.5 mm, calculus occupying the right renal pelvis but no evidence
of ureteral obstruction.  The left kidney demonstrates moderate
hydronephrosis along with perinephric interstitial changes and
fluid consistent with a high-grade obstruction.  Left ureter is
dilated mildly down to the bladder but no ureteral calculi are
present.  There is however a 4.5 mm bladder calculus likely
recently passed from the left ureter.

The stomach, duodenum, small bowel and colon are grossly normal
without oral contrast.  No mesenteric or retroperitoneal mass or
adenopathy.  The aorta is normal in caliber.  No significant
atherosclerotic calcifications.

The uterus is surgically absent.  No pelvic mass, adenopathy or
free pelvic fluid collections.  No inguinal mass or adenopathy.

The bony structures are intact.  Moderate osteoporosis is noted.
IMPRESSION: 1.  Left-sided hydroureteronephrosis with probable recently passed
a 4.5 mm left ureteral calculus into the bladder.
2.  Multiple bilateral renal calculi.  A 13.5 x 9.5 mm calculus is
noted in the right renal pelvis.

## 2014-04-15 ENCOUNTER — Ambulatory Visit (INDEPENDENT_AMBULATORY_CARE_PROVIDER_SITE_OTHER): Payer: Medicare Other | Admitting: Pharmacist

## 2014-04-15 DIAGNOSIS — Z7901 Long term (current) use of anticoagulants: Secondary | ICD-10-CM

## 2014-04-15 DIAGNOSIS — I4891 Unspecified atrial fibrillation: Secondary | ICD-10-CM | POA: Diagnosis not present

## 2014-04-15 DIAGNOSIS — I6350 Cerebral infarction due to unspecified occlusion or stenosis of unspecified cerebral artery: Secondary | ICD-10-CM | POA: Diagnosis not present

## 2014-04-15 DIAGNOSIS — Z5181 Encounter for therapeutic drug level monitoring: Secondary | ICD-10-CM

## 2014-04-15 LAB — POCT INR: INR: 4.1

## 2014-04-18 DIAGNOSIS — H2510 Age-related nuclear cataract, unspecified eye: Secondary | ICD-10-CM | POA: Diagnosis not present

## 2014-04-18 DIAGNOSIS — H269 Unspecified cataract: Secondary | ICD-10-CM | POA: Diagnosis not present

## 2014-04-19 DIAGNOSIS — H2510 Age-related nuclear cataract, unspecified eye: Secondary | ICD-10-CM | POA: Diagnosis not present

## 2014-04-26 ENCOUNTER — Ambulatory Visit (INDEPENDENT_AMBULATORY_CARE_PROVIDER_SITE_OTHER): Payer: Medicare Other

## 2014-04-26 DIAGNOSIS — Z7901 Long term (current) use of anticoagulants: Secondary | ICD-10-CM

## 2014-04-26 DIAGNOSIS — I6350 Cerebral infarction due to unspecified occlusion or stenosis of unspecified cerebral artery: Secondary | ICD-10-CM

## 2014-04-26 DIAGNOSIS — Z5181 Encounter for therapeutic drug level monitoring: Secondary | ICD-10-CM | POA: Diagnosis not present

## 2014-04-26 DIAGNOSIS — I4891 Unspecified atrial fibrillation: Secondary | ICD-10-CM | POA: Diagnosis not present

## 2014-04-26 LAB — POCT INR: INR: 2.7

## 2014-05-06 DIAGNOSIS — H269 Unspecified cataract: Secondary | ICD-10-CM | POA: Diagnosis not present

## 2014-05-06 DIAGNOSIS — H2510 Age-related nuclear cataract, unspecified eye: Secondary | ICD-10-CM | POA: Diagnosis not present

## 2014-05-10 ENCOUNTER — Ambulatory Visit (INDEPENDENT_AMBULATORY_CARE_PROVIDER_SITE_OTHER): Payer: Medicare Other | Admitting: Pharmacist Clinician (PhC)/ Clinical Pharmacy Specialist

## 2014-05-10 DIAGNOSIS — Z5181 Encounter for therapeutic drug level monitoring: Secondary | ICD-10-CM | POA: Diagnosis not present

## 2014-05-10 DIAGNOSIS — I6350 Cerebral infarction due to unspecified occlusion or stenosis of unspecified cerebral artery: Secondary | ICD-10-CM | POA: Diagnosis not present

## 2014-05-10 DIAGNOSIS — I4891 Unspecified atrial fibrillation: Secondary | ICD-10-CM

## 2014-05-10 DIAGNOSIS — Z7901 Long term (current) use of anticoagulants: Secondary | ICD-10-CM

## 2014-05-10 LAB — POCT INR: INR: 2.3

## 2014-05-11 IMAGING — CR DG CHEST 1V PORT
1 series · 1 of 1 positions shown · non-contrast
Comparison: 04/10/2012

CLINICAL DATA: Palpitations.  Cough.

PORTABLE CHEST - 1 VIEW

[AP]
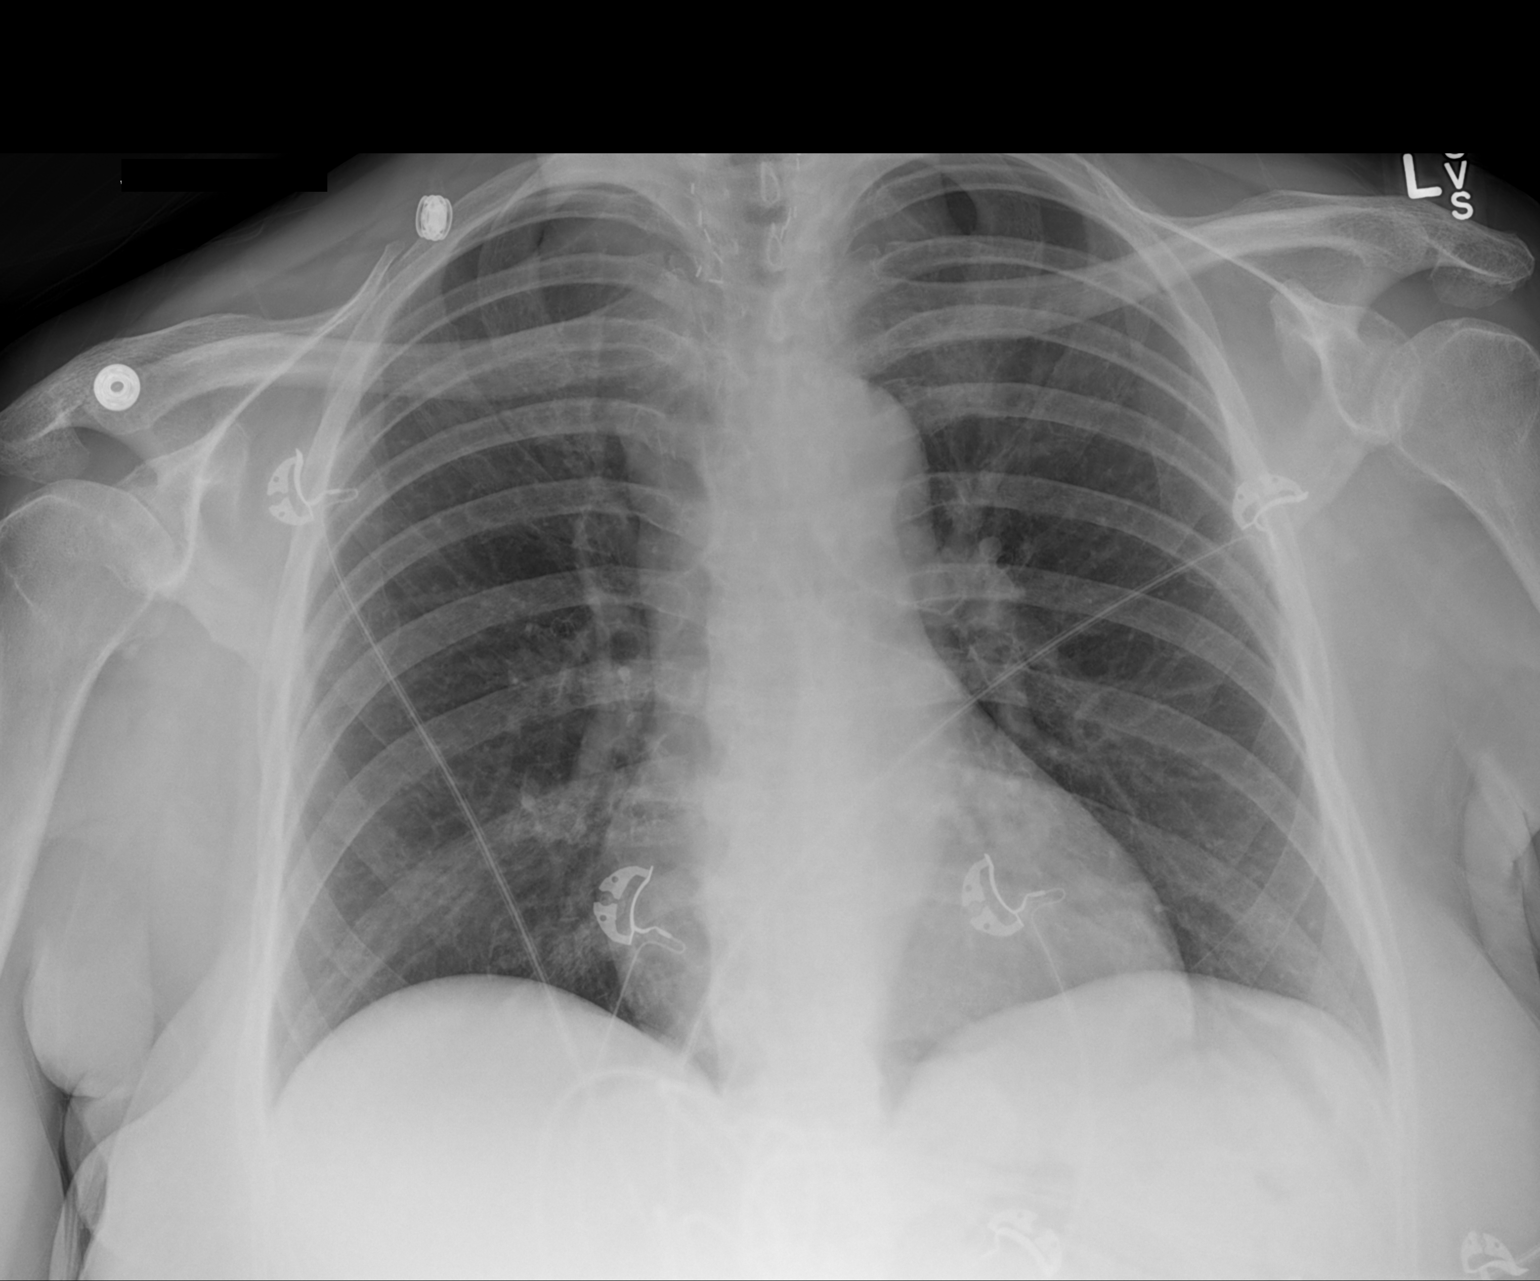

[1 of 1 positions shown; findings below may reference images not displayed]

FINDINGS: The heart size and pulmonary vascularity are normal. The
lungs appear clear and expanded without focal air space disease or
consolidation. No blunting of the costophrenic angles.  No
pneumothorax.  Mediastinal contours appear intact.  Surgical clips
in the base of the neck.  Tortuous aorta.  Degenerative changes in
the shoulders and spine.  No significant change since previous
study.
IMPRESSION: No evidence of active pulmonary disease.

## 2014-05-12 ENCOUNTER — Other Ambulatory Visit: Payer: Self-pay | Admitting: Internal Medicine

## 2014-06-11 IMAGING — CR DG CHEST 1V PORT
1 series · 1 of 1 positions shown · non-contrast
Comparison: Prior chest x-ray 10/08/2012

CLINICAL DATA: Short of breath, cough, fatigue

PORTABLE CHEST - 1 VIEW

[AP]
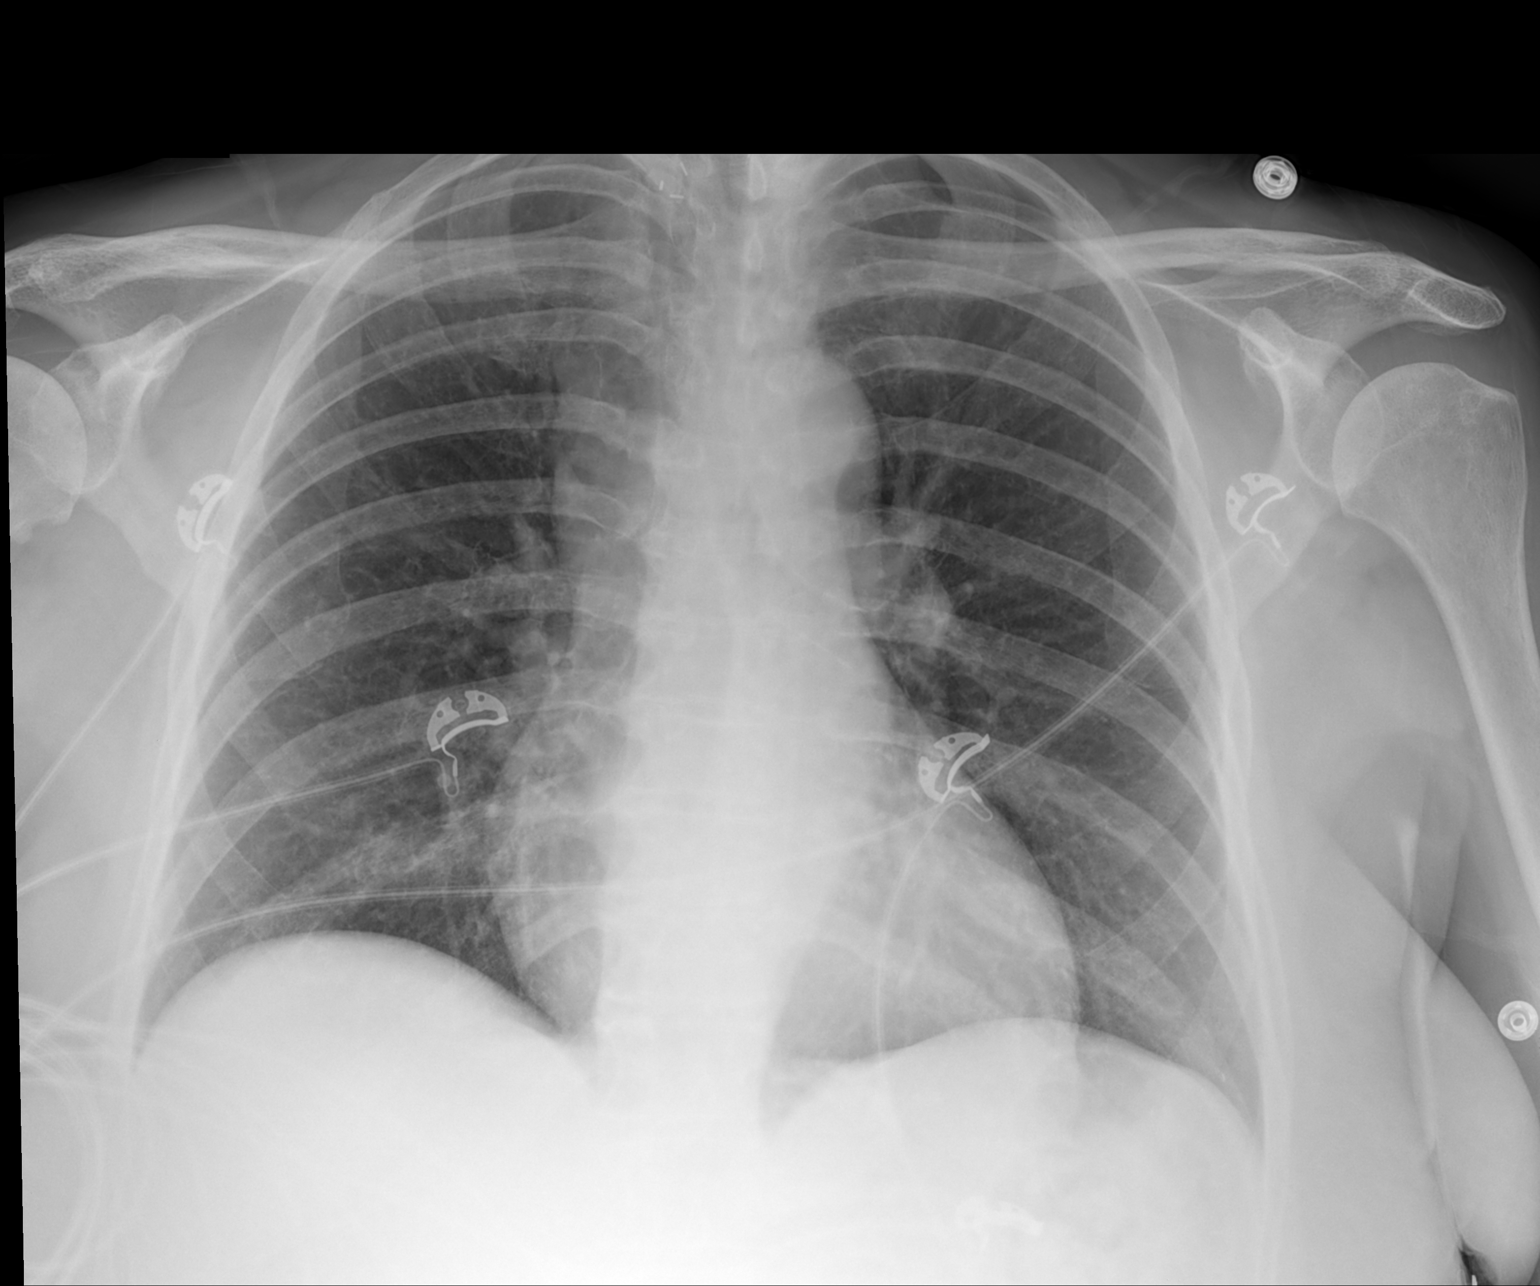

[1 of 1 positions shown; findings below may reference images not displayed]

FINDINGS: Subtle patchy opacity in the right lung base appears new
compared to 10/08/2012.  Cardiac and mediastinal contours are
within normal limits.  The lungs are otherwise clear.  No pleural
effusion, pneumothorax or pulmonary vascular congestion.  Surgical
clips noted in the region of the thyroid.  No acute osseous
abnormality.
IMPRESSION: Subtle patchy opacity in the right lung base may reflect
atelectasis or developing infiltrate.

## 2014-06-12 ENCOUNTER — Other Ambulatory Visit: Payer: Self-pay | Admitting: Internal Medicine

## 2014-06-14 DIAGNOSIS — M5430 Sciatica, unspecified side: Secondary | ICD-10-CM | POA: Diagnosis not present

## 2014-06-14 DIAGNOSIS — M76899 Other specified enthesopathies of unspecified lower limb, excluding foot: Secondary | ICD-10-CM | POA: Diagnosis not present

## 2014-06-16 ENCOUNTER — Ambulatory Visit (INDEPENDENT_AMBULATORY_CARE_PROVIDER_SITE_OTHER): Payer: Medicare Other | Admitting: *Deleted

## 2014-06-16 ENCOUNTER — Ambulatory Visit (INDEPENDENT_AMBULATORY_CARE_PROVIDER_SITE_OTHER): Payer: Medicare Other | Admitting: Pharmacist

## 2014-06-16 ENCOUNTER — Telehealth: Payer: Self-pay | Admitting: Cardiology

## 2014-06-16 DIAGNOSIS — Z5181 Encounter for therapeutic drug level monitoring: Secondary | ICD-10-CM | POA: Diagnosis not present

## 2014-06-16 DIAGNOSIS — Z7901 Long term (current) use of anticoagulants: Secondary | ICD-10-CM

## 2014-06-16 DIAGNOSIS — R001 Bradycardia, unspecified: Secondary | ICD-10-CM

## 2014-06-16 DIAGNOSIS — I4891 Unspecified atrial fibrillation: Secondary | ICD-10-CM

## 2014-06-16 DIAGNOSIS — I6350 Cerebral infarction due to unspecified occlusion or stenosis of unspecified cerebral artery: Secondary | ICD-10-CM | POA: Diagnosis not present

## 2014-06-16 DIAGNOSIS — I498 Other specified cardiac arrhythmias: Secondary | ICD-10-CM

## 2014-06-16 LAB — POCT INR: INR: 5.1

## 2014-06-16 NOTE — Telephone Encounter (Signed)
LMOVM reminding pt to send remote transmission.   

## 2014-06-16 NOTE — Progress Notes (Signed)
Remote pacemaker transmission.   

## 2014-06-17 DIAGNOSIS — H11129 Conjunctival concretions, unspecified eye: Secondary | ICD-10-CM | POA: Diagnosis not present

## 2014-06-19 ENCOUNTER — Other Ambulatory Visit: Payer: Self-pay | Admitting: Internal Medicine

## 2014-06-21 DIAGNOSIS — M76899 Other specified enthesopathies of unspecified lower limb, excluding foot: Secondary | ICD-10-CM | POA: Diagnosis not present

## 2014-06-21 DIAGNOSIS — IMO0002 Reserved for concepts with insufficient information to code with codable children: Secondary | ICD-10-CM | POA: Diagnosis not present

## 2014-06-21 LAB — MDC_IDC_ENUM_SESS_TYPE_REMOTE
Battery Remaining Longevity: 118 mo
Brady Statistic AP VP Percent: 0 %
Brady Statistic AP VS Percent: 0.03 %
Brady Statistic AS VS Percent: 99.93 %
Brady Statistic RV Percent Paced: 0.04 %
Lead Channel Impedance Value: 399 Ohm
Lead Channel Impedance Value: 456 Ohm
Lead Channel Impedance Value: 532 Ohm
Lead Channel Pacing Threshold Amplitude: 0.875 V
Lead Channel Pacing Threshold Amplitude: 1.625 V
Lead Channel Pacing Threshold Pulse Width: 0.4 ms
Lead Channel Pacing Threshold Pulse Width: 0.4 ms
Lead Channel Sensing Intrinsic Amplitude: 1.125 mV
Lead Channel Sensing Intrinsic Amplitude: 10.25 mV
Lead Channel Sensing Intrinsic Amplitude: 10.25 mV
Lead Channel Setting Pacing Amplitude: 2 V
Lead Channel Setting Pacing Amplitude: 3.25 V
Lead Channel Setting Pacing Pulse Width: 0.4 ms
Lead Channel Setting Sensing Sensitivity: 0.9 mV
MDC IDC MSMT BATTERY VOLTAGE: 3.03 V
MDC IDC MSMT LEADCHNL RA SENSING INTR AMPL: 1.125 mV
MDC IDC MSMT LEADCHNL RV IMPEDANCE VALUE: 361 Ohm
MDC IDC SESS DTM: 20150924183927
MDC IDC STAT BRADY AS VP PERCENT: 0.03 %
MDC IDC STAT BRADY RA PERCENT PACED: 0.03 %
Zone Setting Detection Interval: 350 ms
Zone Setting Detection Interval: 400 ms

## 2014-06-23 ENCOUNTER — Encounter: Payer: Self-pay | Admitting: Gastroenterology

## 2014-06-23 ENCOUNTER — Ambulatory Visit (INDEPENDENT_AMBULATORY_CARE_PROVIDER_SITE_OTHER): Payer: Medicare Other

## 2014-06-23 DIAGNOSIS — I639 Cerebral infarction, unspecified: Secondary | ICD-10-CM

## 2014-06-23 DIAGNOSIS — Z7901 Long term (current) use of anticoagulants: Secondary | ICD-10-CM

## 2014-06-23 DIAGNOSIS — Z5181 Encounter for therapeutic drug level monitoring: Secondary | ICD-10-CM | POA: Diagnosis not present

## 2014-06-23 DIAGNOSIS — I4891 Unspecified atrial fibrillation: Secondary | ICD-10-CM

## 2014-06-23 DIAGNOSIS — I6350 Cerebral infarction due to unspecified occlusion or stenosis of unspecified cerebral artery: Secondary | ICD-10-CM

## 2014-06-23 LAB — POCT INR: INR: 3.2

## 2014-06-27 ENCOUNTER — Encounter: Payer: Self-pay | Admitting: Cardiology

## 2014-07-01 ENCOUNTER — Encounter: Payer: Self-pay | Admitting: Internal Medicine

## 2014-07-07 ENCOUNTER — Ambulatory Visit (INDEPENDENT_AMBULATORY_CARE_PROVIDER_SITE_OTHER): Payer: Medicare Other | Admitting: Pharmacist Clinician (PhC)/ Clinical Pharmacy Specialist

## 2014-07-07 DIAGNOSIS — I4891 Unspecified atrial fibrillation: Secondary | ICD-10-CM | POA: Diagnosis not present

## 2014-07-07 DIAGNOSIS — Z5181 Encounter for therapeutic drug level monitoring: Secondary | ICD-10-CM

## 2014-07-07 DIAGNOSIS — I639 Cerebral infarction, unspecified: Secondary | ICD-10-CM | POA: Diagnosis not present

## 2014-07-07 DIAGNOSIS — Z7901 Long term (current) use of anticoagulants: Secondary | ICD-10-CM | POA: Diagnosis not present

## 2014-07-07 DIAGNOSIS — I6350 Cerebral infarction due to unspecified occlusion or stenosis of unspecified cerebral artery: Secondary | ICD-10-CM

## 2014-07-07 LAB — POCT INR: INR: 3.3

## 2014-07-09 ENCOUNTER — Ambulatory Visit (INDEPENDENT_AMBULATORY_CARE_PROVIDER_SITE_OTHER): Payer: Medicare Other

## 2014-07-09 DIAGNOSIS — Z23 Encounter for immunization: Secondary | ICD-10-CM

## 2014-07-11 ENCOUNTER — Other Ambulatory Visit: Payer: Self-pay | Admitting: Internal Medicine

## 2014-07-11 DIAGNOSIS — Z9889 Other specified postprocedural states: Secondary | ICD-10-CM

## 2014-07-11 DIAGNOSIS — Z853 Personal history of malignant neoplasm of breast: Secondary | ICD-10-CM

## 2014-07-15 ENCOUNTER — Encounter: Payer: Self-pay | Admitting: Internal Medicine

## 2014-07-15 ENCOUNTER — Ambulatory Visit (INDEPENDENT_AMBULATORY_CARE_PROVIDER_SITE_OTHER): Payer: Medicare Other | Admitting: Pharmacist

## 2014-07-15 ENCOUNTER — Ambulatory Visit (INDEPENDENT_AMBULATORY_CARE_PROVIDER_SITE_OTHER): Payer: Medicare Other | Admitting: Internal Medicine

## 2014-07-15 VITALS — BP 132/68 | HR 69 | Ht 66.0 in | Wt 244.8 lb

## 2014-07-15 DIAGNOSIS — I442 Atrioventricular block, complete: Secondary | ICD-10-CM | POA: Diagnosis not present

## 2014-07-15 DIAGNOSIS — Z7901 Long term (current) use of anticoagulants: Secondary | ICD-10-CM

## 2014-07-15 DIAGNOSIS — I495 Sick sinus syndrome: Secondary | ICD-10-CM

## 2014-07-15 DIAGNOSIS — I6350 Cerebral infarction due to unspecified occlusion or stenosis of unspecified cerebral artery: Secondary | ICD-10-CM

## 2014-07-15 DIAGNOSIS — I639 Cerebral infarction, unspecified: Secondary | ICD-10-CM

## 2014-07-15 DIAGNOSIS — Z5181 Encounter for therapeutic drug level monitoring: Secondary | ICD-10-CM

## 2014-07-15 DIAGNOSIS — Z45018 Encounter for adjustment and management of other part of cardiac pacemaker: Secondary | ICD-10-CM | POA: Diagnosis not present

## 2014-07-15 DIAGNOSIS — I4891 Unspecified atrial fibrillation: Secondary | ICD-10-CM

## 2014-07-15 LAB — MDC_IDC_ENUM_SESS_TYPE_INCLINIC
Brady Statistic AP VP Percent: 0 %
Brady Statistic AP VS Percent: 0.04 %
Brady Statistic AS VP Percent: 0.04 %
Lead Channel Impedance Value: 380 Ohm
Lead Channel Impedance Value: 380 Ohm
Lead Channel Impedance Value: 456 Ohm
Lead Channel Impedance Value: 513 Ohm
Lead Channel Pacing Threshold Amplitude: 1.5 V
Lead Channel Pacing Threshold Pulse Width: 0.4 ms
Lead Channel Sensing Intrinsic Amplitude: 1.375 mV
Lead Channel Sensing Intrinsic Amplitude: 10.375 mV
Lead Channel Setting Pacing Amplitude: 2 V
Lead Channel Setting Pacing Amplitude: 3 V
Lead Channel Setting Sensing Sensitivity: 0.9 mV
MDC IDC MSMT BATTERY REMAINING LONGEVITY: 118 mo
MDC IDC MSMT BATTERY VOLTAGE: 3.03 V
MDC IDC MSMT LEADCHNL RA PACING THRESHOLD AMPLITUDE: 0.75 V
MDC IDC MSMT LEADCHNL RA PACING THRESHOLD PULSEWIDTH: 0.4 ms
MDC IDC MSMT LEADCHNL RA SENSING INTR AMPL: 1.375 mV
MDC IDC MSMT LEADCHNL RV SENSING INTR AMPL: 10.375 mV
MDC IDC SESS DTM: 20151023121347
MDC IDC SET LEADCHNL RV PACING PULSEWIDTH: 0.4 ms
MDC IDC STAT BRADY AS VS PERCENT: 99.92 %
MDC IDC STAT BRADY RA PERCENT PACED: 0.04 %
MDC IDC STAT BRADY RV PERCENT PACED: 0.04 %
Zone Setting Detection Interval: 350 ms
Zone Setting Detection Interval: 400 ms

## 2014-07-15 LAB — POCT INR: INR: 2

## 2014-07-15 MED ORDER — AMIODARONE HCL 200 MG PO TABS: 100.0000 mg | ORAL_TABLET | Freq: Every day | ORAL | Status: DC

## 2014-07-15 NOTE — Patient Instructions (Addendum)
Decrease amiodarone to 100mg  daily  Your physician wants you to follow-up in: 6 months with Dr. Caryl Comes.  You will receive a reminder letter in the mail two months in advance. If you don't receive a letter, please call our office to schedule the follow-up appointment. Marland Kitchen

## 2014-07-15 NOTE — Progress Notes (Signed)
Patient Care Team: Hendricks Limes, MD as PCP - General   HPI  Kayla Harrison is a 68 y.o. female Seen in followup for a pacemaker implanted at [facility]     She has atrial fibrillation for which she underwent pulmonary vein isolation at [facility] 2009 She's had problems with recurrent atrial fibrillation. She underwent convergent ablation at [facility] 4/31; it was complicated by heart block prompting pacemaker implantation.     She has a history of prior strokes and remains on oral anticoagulation .   Echo 2011 EF 55% with mod LAE-6mm   LV function was normal Echocardiogram post ablation demonstrated normal left ventricular function and without effusion.   She also has a history of morbid obesity status post lap band surgery    Past Medical History  Diagnosis Date  . Morbid obesity     Status post lap band surgery  . Hx of thyroid cancer     Dr Forde Dandy  . Colon polyp     Dr Earlean Shawl  . Dyslipidemia   . Hyperlipidemia   . Hypothyroidism   . Breast cancer     Dr Margot Chimes, total thyroidectomy- 1999- for cancer  . Pneumonia 2010    [facility]- during that event had to be cardioverted   . Arthritis     osteoarthritis - knees   . Normal cardiac stress test     2008- cardiac stress/echo  . Stroke     2003- [place]   . Atrial fibrillation     ablation- 2x's-- 1st time- [facility], 2nd event at [facility] in 2008. Convergent ablation at [facility] 6/14  . Complication of anesthesia     Ketamine produces LSD reaction, bright colored nightmarish experience   . Hepatitis     [place]pat[place]   . Sinus node dysfunction     Complicating convergent ablation 6/14  . Pacemaker-Medtronic     Past Surgical History  Procedure Laterality Date  . Thyroidectomy  1998    Dr Margot Chimes  . Bso  1998  . Cholecystectomy    . Tonsillectomy    . Colonoscopy w/ polypectomy      Dr Earlean Shawl  . Afib ablation      X 2; [facility] & Dr Caryl Comes  . Abdominal hysterectomy  1983  . Breast  lumpectomy  2010  . Laparoscopic gastric banding  07/10/2010  . Appendectomy    . Knee arthroscopy      bilateral  . Laparotomy      for ruptured ovary and ovarian artery   . Total knee arthroplasty  04/13/2012    Procedure: TOTAL KNEE ARTHROPLASTY;  Surgeon: Rudean Haskell, MD;  Location: [place];  Service: Orthopedics;  Laterality: Right;  . Joint replacement  04/13/12    Right Knee replacement  . Cardioversion  10/09/2012    Procedure: CARDIOVERSION;  Surgeon: Minus Breeding, MD;  Location: [place];  Service: Cardiovascular;  Laterality: N/A;  . Cardioversion  10/09/2012    Procedure: CARDIOVERSION;  Surgeon: Minus Breeding, MD;  Location: [facility] ENDOSCOPY;  Service: Cardiovascular;  Laterality: N/A;  [name] gave the ok to add pt to the add on , but we must check to find out if the can add pt on at [phone] ( [phone])  . Cardioversion N/A 11/20/2012    Procedure: CARDIOVERSION;  Surgeon: Fay Records, MD;  Location: [facility] ENDOSCOPY;  Service: Cardiovascular;  Laterality: N/A;  . Transabdominal ablation  03/02/13  [facility]  . Pacemaker insertion  03/10/13    [facility]  . Convergent      Current Outpatient Prescriptions  Medication Sig Dispense Refill  . amiodarone (PACERONE) 200 MG tablet TAKE 1 TABLET (200 MG TOTAL) BY MOUTH DAILY.  30 tablet  6  . BIOTIN PO Take 1 tablet by mouth daily.       . furosemide (LASIX) 40 MG tablet TAKE 1 TABLET BY MOUTH EVERY DAY AS DIRECTED  30 tablet  1  . levothyroxine (SYNTHROID, LEVOTHROID) 175 MCG tablet Take 175 mcg by mouth daily before breakfast. TAKE 1 TABLET BY MOUTH EVERY DAY EXCEPT ON SAT.      . Multiple Vitamin (MULTIVITAMIN) tablet Take 2 tablets by mouth daily.       . pravastatin (PRAVACHOL) 40 MG tablet TAKE 1 TABLET (40 MG TOTAL) BY MOUTH DAILY.  90 tablet  1  . TAURINE PO Take 1 tablet by mouth daily.       Marland Kitchen warfarin (COUMADIN) 2.5 MG tablet Take 2.5 mg by mouth daily. Sunday, Tuesday, Thursday, Saturday      . warfarin (COUMADIN) 5 MG tablet TAKE 1/2  TABLET DAILY EXCEPT TAKE 1 TABLET ON MONDAYS, WEDNESDAYS, AND FRIDAYS OR AS DIRECTED  35 tablet  3   No current facility-administered medications for this visit.    Allergies  Allergen Reactions  . Tikosyn [Dofetilide]     Prolonged QT interval Prolonged QT interval  . Xarelto [Rivaroxaban]     Nose Bleed X 6 hrs ; packing in ER  . Epinephrine Other (See Comments) and Rash    Dental form only (liquid). Patient stated she will become "out of it" she can hear you but cannot respond in normal fashion.  Marland Kitchen Ketamine     Hallucinations    Review of Systems negative except from HPI and PMH  Physical Exam BP 132/68  Pulse 69  Ht 5\' 6"  (1.676 m)  Wt 244 lb 12.8 oz (111.041 kg)  BMI 39.53 kg/m2 Well developed and well nourished in no acute distress HENT normal E scleral and icterus clear Neck Supple JVP flat; carotids brisk and full Clear to ausculation  Regular rate and rhythm, no murmurs gallops or rub Soft with active bowel sounds No clubbing cyanosis   Edema Alert and oriented, grossly normal motor and sensory function Skin Warm and Dry    Assessment and  Plan  Atrial fibrillation  Pacemaker  Doing well following device revision. There is infrequent atrial fibrillation. There is improved exercise activity.

## 2014-07-26 DIAGNOSIS — N200 Calculus of kidney: Secondary | ICD-10-CM | POA: Diagnosis not present

## 2014-07-26 DIAGNOSIS — I480 Paroxysmal atrial fibrillation: Secondary | ICD-10-CM | POA: Diagnosis not present

## 2014-07-26 DIAGNOSIS — I779 Disorder of arteries and arterioles, unspecified: Secondary | ICD-10-CM | POA: Diagnosis not present

## 2014-07-26 DIAGNOSIS — C73 Malignant neoplasm of thyroid gland: Secondary | ICD-10-CM | POA: Diagnosis not present

## 2014-07-26 DIAGNOSIS — E668 Other obesity: Secondary | ICD-10-CM | POA: Diagnosis not present

## 2014-07-26 DIAGNOSIS — E890 Postprocedural hypothyroidism: Secondary | ICD-10-CM | POA: Diagnosis not present

## 2014-07-26 DIAGNOSIS — E785 Hyperlipidemia, unspecified: Secondary | ICD-10-CM | POA: Diagnosis not present

## 2014-07-26 DIAGNOSIS — C50911 Malignant neoplasm of unspecified site of right female breast: Secondary | ICD-10-CM | POA: Diagnosis not present

## 2014-07-27 ENCOUNTER — Encounter: Payer: Self-pay | Admitting: Internal Medicine

## 2014-07-29 ENCOUNTER — Ambulatory Visit (INDEPENDENT_AMBULATORY_CARE_PROVIDER_SITE_OTHER): Payer: Medicare Other | Admitting: *Deleted

## 2014-07-29 DIAGNOSIS — Z7901 Long term (current) use of anticoagulants: Secondary | ICD-10-CM | POA: Diagnosis not present

## 2014-07-29 DIAGNOSIS — I6350 Cerebral infarction due to unspecified occlusion or stenosis of unspecified cerebral artery: Secondary | ICD-10-CM

## 2014-07-29 DIAGNOSIS — I4891 Unspecified atrial fibrillation: Secondary | ICD-10-CM | POA: Diagnosis not present

## 2014-07-29 DIAGNOSIS — Z5181 Encounter for therapeutic drug level monitoring: Secondary | ICD-10-CM | POA: Diagnosis not present

## 2014-07-29 DIAGNOSIS — I639 Cerebral infarction, unspecified: Secondary | ICD-10-CM

## 2014-07-29 LAB — POCT INR: INR: 2.7

## 2014-08-02 DIAGNOSIS — M7072 Other bursitis of hip, left hip: Secondary | ICD-10-CM | POA: Diagnosis not present

## 2014-08-08 ENCOUNTER — Ambulatory Visit
Admission: RE | Admit: 2014-08-08 | Discharge: 2014-08-08 | Disposition: A | Discharge status: Home / Self Care | Payer: Medicare Other | Source: Ambulatory Visit | Attending: Internal Medicine | Admitting: Internal Medicine

## 2014-08-08 DIAGNOSIS — Z853 Personal history of malignant neoplasm of breast: Secondary | ICD-10-CM | POA: Diagnosis not present

## 2014-08-08 DIAGNOSIS — Z9889 Other specified postprocedural states: Secondary | ICD-10-CM

## 2014-08-15 ENCOUNTER — Ambulatory Visit (INDEPENDENT_AMBULATORY_CARE_PROVIDER_SITE_OTHER): Payer: Medicare Other | Admitting: Pharmacist

## 2014-08-15 DIAGNOSIS — Z5181 Encounter for therapeutic drug level monitoring: Secondary | ICD-10-CM

## 2014-08-15 DIAGNOSIS — I6350 Cerebral infarction due to unspecified occlusion or stenosis of unspecified cerebral artery: Secondary | ICD-10-CM

## 2014-08-15 DIAGNOSIS — Z7901 Long term (current) use of anticoagulants: Secondary | ICD-10-CM | POA: Diagnosis not present

## 2014-08-15 DIAGNOSIS — I4891 Unspecified atrial fibrillation: Secondary | ICD-10-CM

## 2014-08-15 DIAGNOSIS — I639 Cerebral infarction, unspecified: Secondary | ICD-10-CM

## 2014-08-15 LAB — POCT INR: INR: 3.1

## 2014-09-01 ENCOUNTER — Encounter (HOSPITAL_COMMUNITY): Payer: Self-pay | Admitting: Internal Medicine

## 2014-09-06 ENCOUNTER — Ambulatory Visit (INDEPENDENT_AMBULATORY_CARE_PROVIDER_SITE_OTHER): Payer: Medicare Other | Admitting: Surgery

## 2014-09-06 DIAGNOSIS — I639 Cerebral infarction, unspecified: Secondary | ICD-10-CM

## 2014-09-06 DIAGNOSIS — I4891 Unspecified atrial fibrillation: Secondary | ICD-10-CM

## 2014-09-06 DIAGNOSIS — I6350 Cerebral infarction due to unspecified occlusion or stenosis of unspecified cerebral artery: Secondary | ICD-10-CM

## 2014-09-06 DIAGNOSIS — Z7901 Long term (current) use of anticoagulants: Secondary | ICD-10-CM

## 2014-09-06 DIAGNOSIS — Z5181 Encounter for therapeutic drug level monitoring: Secondary | ICD-10-CM

## 2014-09-06 LAB — POCT INR: INR: 2.7

## 2014-09-28 IMAGING — CR DG ABDOMEN 1V
2 series · 2 of 2 positions shown · non-contrast
Comparison: CT abdomen pelvis scout film of 09/10/2012 and KUB of
07/11/2010 prior to upper GI

CLINICAL DATA: Evaluate lap band placement, vomiting, lap band
placed 3 years ago

ABDOMEN - 1 VIEW

[t abdomen supine (1 of 2)]
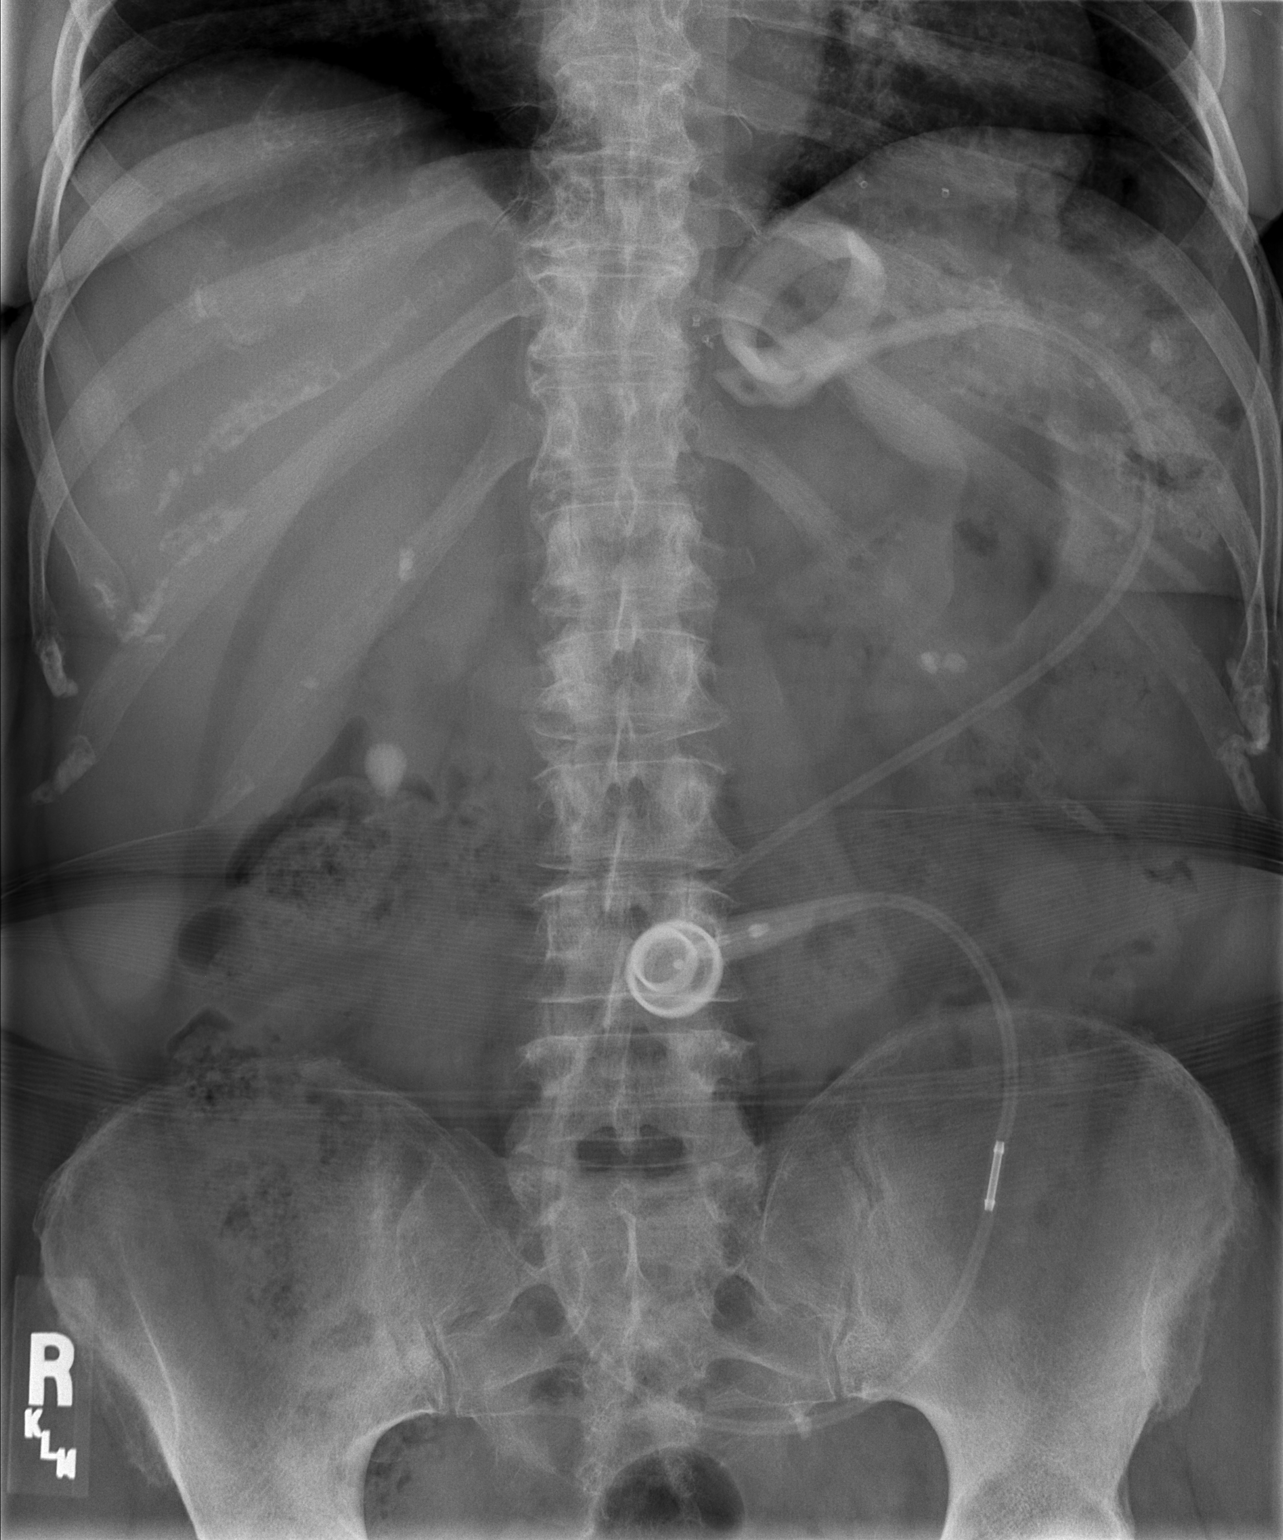

[t abdomen supine (2 of 2)]
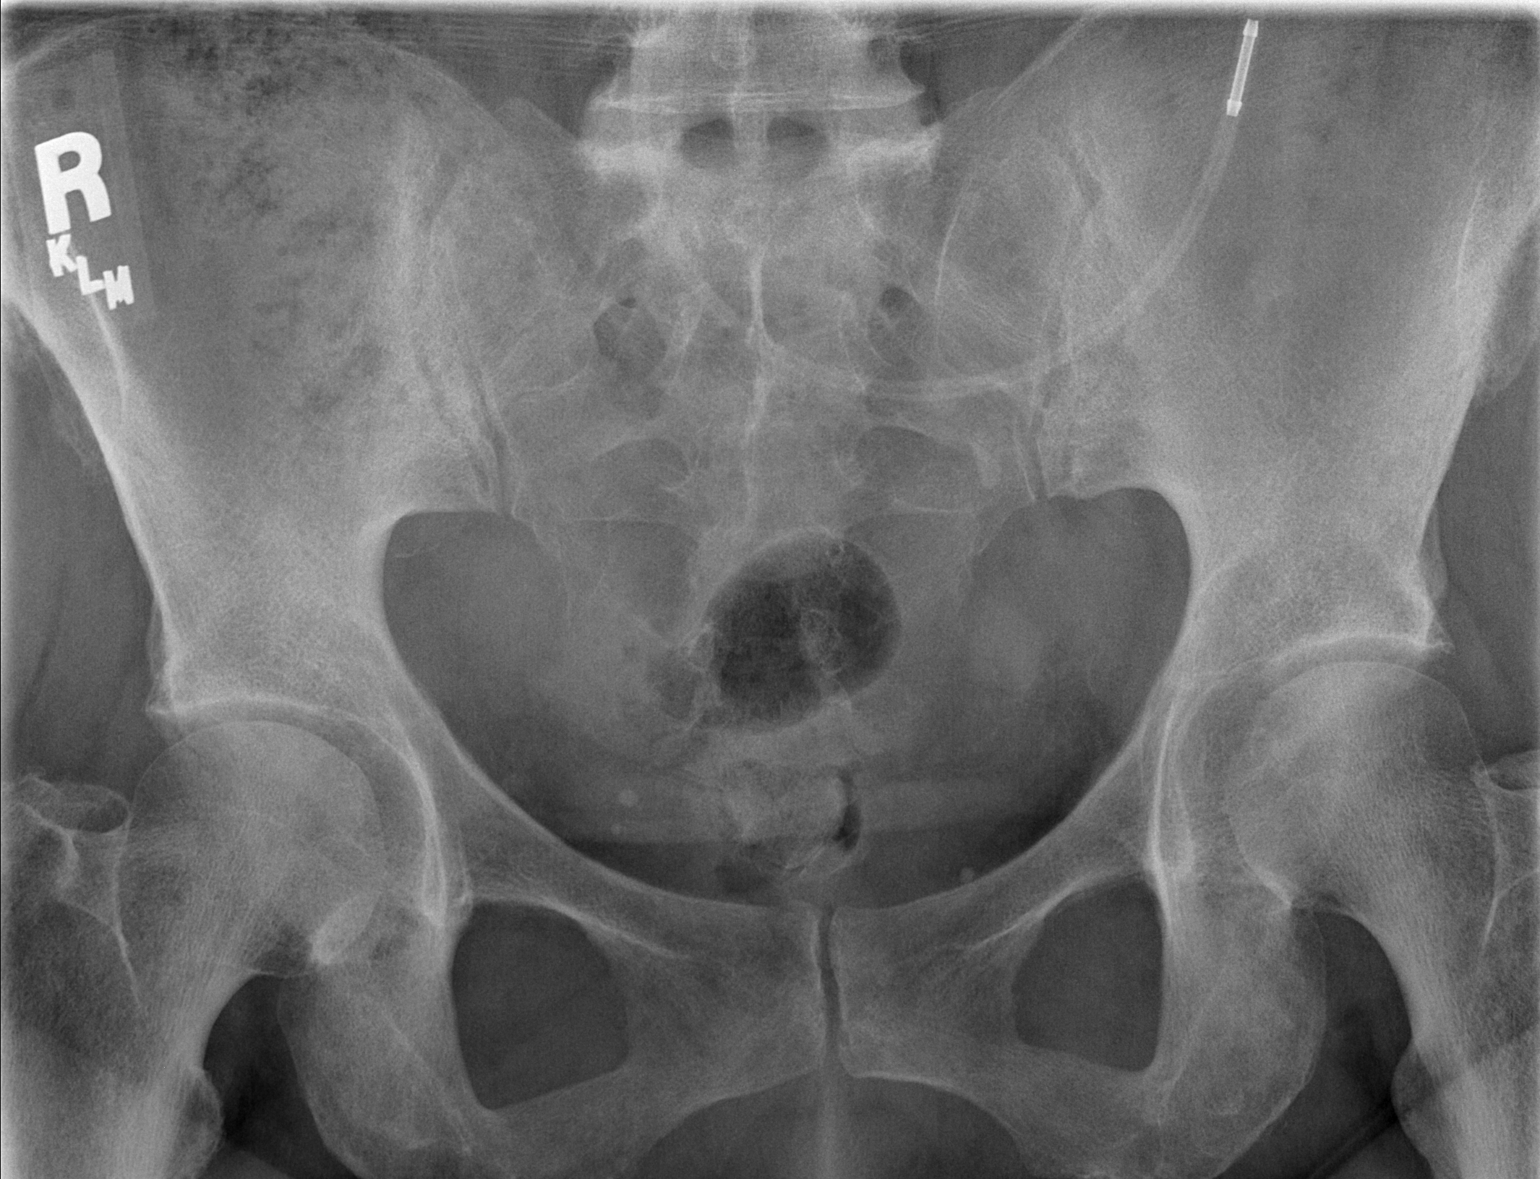

[2 of 2 positions shown; findings below may reference images not displayed]

FINDINGS: When compared to the scout film from the CT, there is
little apparent change in the orientation of the lap band although
there may be slightly more rotation now present.  However, compared
to the KUB prior to the upper GI of 7422, the band was much more
horizontal in position at that time and now is more oriented from
the [DATE] to [DATE] position with slight rotation.  The bowel gas
pattern is nonspecific.  Multiple bilateral renal calculi are
noted.
IMPRESSION: Compared to the CT scout film from 0460 the lap band may be
slightly rotated but it has not changed significantly in
orientation.  However compared to the KUB from 7422 there has been
a change in orientation of the lap band.

## 2014-10-04 ENCOUNTER — Other Ambulatory Visit: Payer: Self-pay | Admitting: Internal Medicine

## 2014-10-04 ENCOUNTER — Ambulatory Visit (INDEPENDENT_AMBULATORY_CARE_PROVIDER_SITE_OTHER): Payer: Medicare Other | Admitting: *Deleted

## 2014-10-04 DIAGNOSIS — Z7901 Long term (current) use of anticoagulants: Secondary | ICD-10-CM

## 2014-10-04 DIAGNOSIS — I4891 Unspecified atrial fibrillation: Secondary | ICD-10-CM

## 2014-10-04 DIAGNOSIS — Z5181 Encounter for therapeutic drug level monitoring: Secondary | ICD-10-CM

## 2014-10-04 DIAGNOSIS — I6350 Cerebral infarction due to unspecified occlusion or stenosis of unspecified cerebral artery: Secondary | ICD-10-CM

## 2014-10-04 DIAGNOSIS — I639 Cerebral infarction, unspecified: Secondary | ICD-10-CM

## 2014-10-04 LAB — POCT INR: INR: 2.4

## 2014-10-17 ENCOUNTER — Ambulatory Visit (INDEPENDENT_AMBULATORY_CARE_PROVIDER_SITE_OTHER): Payer: Medicare Other | Admitting: *Deleted

## 2014-10-17 DIAGNOSIS — I495 Sick sinus syndrome: Secondary | ICD-10-CM | POA: Diagnosis not present

## 2014-10-17 LAB — MDC_IDC_ENUM_SESS_TYPE_REMOTE
Brady Statistic AP VP Percent: 0 %
Brady Statistic AP VS Percent: 0.02 %
Brady Statistic AS VP Percent: 0.03 %
Brady Statistic AS VS Percent: 99.95 %
Brady Statistic RA Percent Paced: 0.02 %
Brady Statistic RV Percent Paced: 0.04 %
Date Time Interrogation Session: 20160125175954
Lead Channel Impedance Value: 361 Ohm
Lead Channel Impedance Value: 380 Ohm
Lead Channel Impedance Value: 437 Ohm
Lead Channel Impedance Value: 513 Ohm
Lead Channel Pacing Threshold Amplitude: 1 V
Lead Channel Pacing Threshold Amplitude: 1.625 V
Lead Channel Pacing Threshold Pulse Width: 0.4 ms
Lead Channel Setting Pacing Amplitude: 2 V
Lead Channel Setting Pacing Amplitude: 3.25 V
Lead Channel Setting Pacing Pulse Width: 0.4 ms
MDC IDC MSMT BATTERY REMAINING LONGEVITY: 110 mo
MDC IDC MSMT BATTERY VOLTAGE: 3.02 V
MDC IDC MSMT LEADCHNL RA SENSING INTR AMPL: 0.875 mV
MDC IDC MSMT LEADCHNL RV PACING THRESHOLD PULSEWIDTH: 0.4 ms
MDC IDC MSMT LEADCHNL RV SENSING INTR AMPL: 10 mV
MDC IDC SET LEADCHNL RV SENSING SENSITIVITY: 0.9 mV
MDC IDC SET ZONE DETECTION INTERVAL: 350 ms
MDC IDC SET ZONE DETECTION INTERVAL: 400 ms

## 2014-10-17 NOTE — Progress Notes (Signed)
Remote pacemaker transmission.   

## 2014-10-26 ENCOUNTER — Encounter: Payer: Self-pay | Admitting: Gastroenterology

## 2014-10-26 ENCOUNTER — Telehealth: Payer: Self-pay | Admitting: *Deleted

## 2014-10-26 ENCOUNTER — Ambulatory Visit (INDEPENDENT_AMBULATORY_CARE_PROVIDER_SITE_OTHER): Payer: Medicare Other | Admitting: Gastroenterology

## 2014-10-26 VITALS — BP 136/78 | HR 78 | Ht 66.0 in | Wt 247.7 lb

## 2014-10-26 DIAGNOSIS — Z800 Family history of malignant neoplasm of digestive organs: Secondary | ICD-10-CM | POA: Insufficient documentation

## 2014-10-26 DIAGNOSIS — D126 Benign neoplasm of colon, unspecified: Secondary | ICD-10-CM

## 2014-10-26 MED ORDER — NA SULFATE-K SULFATE-MG SULF 17.5-3.13-1.6 GM/177ML PO SOLN
1.0000 | Freq: Once | ORAL | Status: DC
Start: 2014-10-26 — End: 2014-10-27

## 2014-10-26 NOTE — Telephone Encounter (Signed)
  10/26/2014   RE: Kayla Harrison DOB: 1945/12/06 MRN: [id number]   Dear  Dr Jens Som,   We have scheduled the above patient for an endoscopic procedure. Our records show that she is on anticoagulation therapy.   Please advise as to how long the patient may come off her therapy of coumadin prior to the procedure, which is scheduled for 11/29/2014.  Please fax back/ or route the completed form to [place] at [phone].   Sincerely,    Genella Mech    Does patient need Lovenox bridge?

## 2014-10-26 NOTE — Assessment & Plan Note (Signed)
Plan screening colonoscopy every 5 years

## 2014-10-26 NOTE — Patient Instructions (Signed)
You have been scheduled for a colonoscopy. Please follow written instructions given to you at your visit today.  Please pick up your prep kit at the pharmacy within the next 1-3 days. If you use inhalers (even only as needed), please bring them with you on the day of your procedure. Your physician has requested that you go to www.startemmi.com and enter the access code given to you at your visit today. This web site gives a general overview about your procedure. However, you should still follow specific instructions given to you by our office regarding your preparation for the procedure.  You will be contaced by our office prior to your procedure for directions on holding your Coumadin/Warfarin.  If you do not hear from our office 1 week prior to your scheduled procedure, please call [phone] to discuss.

## 2014-10-26 NOTE — Assessment & Plan Note (Signed)
Plan follow-up colonoscopy.  Given the choice between undergoing colonoscopy while on Coumadin with a possibility of having to repeat examination if polyps are seen, versus Olding Coumadin with a possible Lovenox bridge, she has chosen the latter. The risk of holding anticoagulation therapy or antiplatelet medications was discussed including the increased risk for thromboembolic disease that may include DVT, pulmonary emboli and stroke. The patient understands this risk and is willing to proceed with temporally holding the medication provided that this is approved by her PCP or cardiologist.

## 2014-10-26 NOTE — Progress Notes (Signed)
_                                                                                                                History of Present Illness:  Kayla Harrison is a 69 year old white female with history of atrial fibrillation, status post obliteration therapy, breast cancer and CVA, on Coumadin, referred for colonoscopy.  Family history is pertinent for father who had colon cancer.  Patient has had a colonoscopy with removal of adenomatous polyps.  Last exam was 2010 where 2 small adenomatous polyps were removed.  The patient has no GI complaints including change of bowel habits, abdominal pain or rectal bleeding.  She rarely sees small amounts of stool in her underclothes after episodes of straining.   Past Medical History  Diagnosis Date  . Morbid obesity     Status post lap band surgery  . Hx of thyroid cancer     Dr Forde Dandy  . Colon polyp     Dr Earlean Shawl  . Dyslipidemia   . Hyperlipidemia   . Hypothyroidism   . Breast cancer     Dr Margot Chimes, total thyroidectomy- 1999- for cancer  . Pneumonia 2010    [facility]- during that event had to be cardioverted   . Arthritis     osteoarthritis - knees   . Normal cardiac stress test     2008- cardiac stress/echo  . Stroke     2003- [place]   . Atrial fibrillation     ablation- 2x's-- 1st time- [facility], 2nd event at [facility] in 2008. Convergent ablation at [facility] 6/14  . Complication of anesthesia     Ketamine produces LSD reaction, bright colored nightmarish experience   . Hepatitis     [place] ?hepatitis   . Sinus node dysfunction     Complicating convergent ablation 6/14  . Pacemaker-Medtronic    Past Surgical History  Procedure Laterality Date  . Thyroidectomy  1998    Dr Margot Chimes  . Bso  1998  . Cholecystectomy    . Tonsillectomy    . Colonoscopy w/ polypectomy      Dr Earlean Shawl  . Afib ablation      X 2; [facility] & Dr Caryl Comes  . Abdominal hysterectomy  1983  . Breast lumpectomy  2010  .  Laparoscopic gastric banding  07/10/2010  . Appendectomy    . Knee arthroscopy      bilateral  . Laparotomy      for ruptured ovary and ovarian artery   . Total knee arthroplasty  04/13/2012    Procedure: TOTAL KNEE ARTHROPLASTY;  Surgeon: Rudean Haskell, MD;  Location: [place];  Service: Orthopedics;  Laterality: Right;  . Joint replacement  04/13/12    Right Knee replacement  . Cardioversion  10/09/2012    Procedure: CARDIOVERSION;  Surgeon: Minus Breeding, MD;  Location: [place];  Service: Cardiovascular;  Laterality: N/A;  . Cardioversion  10/09/2012    Procedure: CARDIOVERSION;  Surgeon: Minus Breeding,  MD;  Location: [place];  Service: Cardiovascular;  Laterality: N/A;  [name] gave the ok to add pt to the add on , but we must check to find out if the can add pt on at 1400 ( [phone])  . Cardioversion N/A 11/20/2012    Procedure: CARDIOVERSION;  Surgeon: Fay Records, MD;  Location: [facility] ENDOSCOPY;  Service: Cardiovascular;  Laterality: N/A;  . Transabdominal ablation  03/02/13    [facility]  . Pacemaker insertion  03/10/13    [facility]  . Convergent    . Pocket revision N/A 12/08/2013    Procedure: POCKET REVISION;  Surgeon: Deboraha Sprang, MD;  Location: [facility] CATH LAB;  Service: Cardiovascular;  Laterality: N/A;   family history includes Cancer in her father; Diabetes in her brother, paternal aunt, paternal grandmother, and sister; Heart disease (age of onset: 51) in her father. Current Outpatient Prescriptions  Medication Sig Dispense Refill  . amiodarone (PACERONE) 200 MG tablet Take 0.5 tablets (100 mg total) by mouth daily. 30 tablet 6  . celecoxib (CELEBREX) 200 MG capsule Take 200 mg by mouth daily.     . furosemide (LASIX) 40 MG tablet TAKE 1 TABLET BY MOUTH EVERY DAY AS DIRECTED 30 tablet 1  . levothyroxine (SYNTHROID, LEVOTHROID) 175 MCG tablet Take 175 mcg by mouth daily before breakfast. TAKE 1 TABLET BY MOUTH EVERY DAY EXCEPT ON SAT.    . Multiple Vitamin (MULTIVITAMIN) tablet Take 2  tablets by mouth daily.     . pravastatin (PRAVACHOL) 40 MG tablet TAKE 1 TABLET (40 MG TOTAL) BY MOUTH DAILY. 90 tablet 1  . TAURINE PO Take 1 tablet by mouth daily.     Marland Kitchen warfarin (COUMADIN) 5 MG tablet TAKE 1/2 TABLET DAILY EXCEPT TAKE 1 TABLET ON MONDAYS, WEDNESDAYS, AND FRIDAYS OR AS DIRECTED (Patient taking differently: TAKE 1/2 TABLET DAILY EXCEPT TAKE 1 TABLET ON MONDAYS, AND FRIDAYS OR AS DIRECTED) 35 tablet 3   No current facility-administered medications for this visit.   Allergies as of 10/26/2014 - Review Complete 10/26/2014  Allergen Reaction Noted  . Tikosyn [dofetilide]  04/07/2013  . Xarelto [rivaroxaban]  02/11/2012  . Epinephrine Other (See Comments) and Rash 12/25/2010  . Ketamine  04/16/2013    reports that she has never smoked. She has never used smokeless tobacco. She reports that she does not drink alcohol or use illicit drugs.   Review of Systems: Pertinent positive and negative review of systems were noted in the above HPI section. All other review of systems were otherwise negative.  Vital signs were reviewed in today's medical record Physical Exam: General: Well developed , well nourished, no acute distress Skin: anicteric Head: Normocephalic and atraumatic Eyes:  sclerae anicteric, EOMI Ears: Normal auditory acuity Mouth: No deformity or lesions Neck: Supple, no masses or thyromegaly Lungs: Clear throughout to auscultation Heart: Regular rate and rhythm; no  rubs or bruits.  There is a 1/6 early systolic murmur Abdomen: Soft, non tender and non distended. No masses, hepatosplenomegaly or hernias noted. Normal Bowel sounds Rectal:deferred Musculoskeletal: Symmetrical with no gross deformities  Skin: No lesions on visible extremities Pulses:  Normal pulses noted Extremities: No clubbing, cyanosis, edema or deformities noted Neurological: Alert oriented x 4, grossly nonfocal Cervical Nodes:  No significant cervical adenopathy Inguinal Nodes: No  significant inguinal adenopathy Psychological:  Alert and cooperative. Normal mood and affect  See Assessment and Plan under Problem List

## 2014-10-27 ENCOUNTER — Encounter: Payer: Self-pay | Admitting: Internal Medicine

## 2014-10-27 ENCOUNTER — Ambulatory Visit (INDEPENDENT_AMBULATORY_CARE_PROVIDER_SITE_OTHER): Payer: Medicare Other | Admitting: Internal Medicine

## 2014-10-27 VITALS — BP 130/74 | HR 72 | Temp 97.4°F | Ht 66.0 in | Wt 252.0 lb

## 2014-10-27 DIAGNOSIS — I639 Cerebral infarction, unspecified: Secondary | ICD-10-CM

## 2014-10-27 DIAGNOSIS — E781 Pure hyperglyceridemia: Secondary | ICD-10-CM

## 2014-10-27 DIAGNOSIS — H9392 Unspecified disorder of left ear: Secondary | ICD-10-CM | POA: Diagnosis not present

## 2014-10-27 DIAGNOSIS — H9312 Tinnitus, left ear: Secondary | ICD-10-CM

## 2014-10-27 NOTE — Progress Notes (Signed)
Pre visit review using our clinic review tool, if applicable. No additional management support is needed unless otherwise documented below in the visit note. 

## 2014-10-27 NOTE — Progress Notes (Signed)
   Subjective:    Patient ID: Kayla Harrison, female    DOB: 05-08-1946, 69 y.o.   MRN: [id number]  HPI  Symptoms began 1 week ago as a rumbling noise in the left ear. There were no specific triggers prior to this such as a significant upper respiratory tract infection, excessive noise exposure, or excess aspirin ingestion.  She also brought in nonfasting labs from her endocrinologist. Glucose was 114 but A1c was 5.7%. LDL was 116 and HDL 49. Triglycerides were 230; again this was nonfasting.  Review of Systems Frontal headache, facial pain , nasal purulence, dental pain, sore throat , otic pain or otic discharge denied. No fever , chills or sweats.    Objective:   Physical Exam  Pertinent or positive findings include: The tympanic membranes are somewhat dull. The light reflux is somewhat diffuse on the left. Although air conduction is greater than bone conduction bilaterally; the tuning fork lateralizes to the right.  General appearance:Adequately nourished; no acute distress or increased work of breathing is present.  No  lymphadenopathy about the head, neck, or axilla noted.  Eyes: No conjunctival inflammation or lid edema is present. There is no scleral icterus. Ears:  External ear exam shows no significant lesions or deformities.  Otoscopic examination reveals  tympanic membranes are intact bilaterally without bulging, retraction, inflammation or discharge. Nose:  External nasal examination shows no deformity or inflammation. Nasal mucosa are pink and moist without lesions or exudates. No septal dislocation or deviation.No obstruction to airflow.  Oral exam: Dental hygiene is good; lips and gums are healthy appearing.There is no oropharyngeal erythema or exudate noted.  Neck:  No deformities, thyromegaly, masses, or tenderness noted.   Supple with full range of motion without pain.  Heart:  Normal rate and regular rhythm. S1 and S2 normal without gallop, murmur, click, rub or other  extra sounds.  Lungs:Chest clear to auscultation; no wheezes, rhonchi,rales ,or rubs present. Extremities:  No cyanosis, edema, or clubbing  noted  Skin: Warm & dry w/o jaundice or tenting.       Assessment & Plan:  #1 tinnitus;? Eustachian tube dysfunction #2 abnormal tuning fork exam #3 mixed dyslipidemia See Orders & AVS

## 2014-10-27 NOTE — Patient Instructions (Addendum)
The ENT referral will be scheduled and you'll be notified of the time.Please call the Referral Co-Ordinator @ [phone] if you have not been notified of appointment time within 7-10 days.  Go to [facility] for eustachian tube dysfunction. Drink thin  fluids liberally through the day and chew sugarless gum . Do the Valsalva maneuver several times a day to "pop" ears open. Flonase 1 spray in L nostril twice a day as needed. Use the "crossover" technique as discussed.   The most common cause of elevated triglycerides (TG) is the ingestion of sugar from high fructose corn syrup sources added to processed foods & drinks.  Eat a low-fat diet with lots of fruits and vegetables, up to 7-9 servings per day. Consume less than 30 Grams (preferably ZERO) of sugar per day from foods & drinks with High Fructose Corn Syrup (HFCS) sugar as #1,2,3 or # 4 on label.[employer], [employer] do not carry products with HFCS.

## 2014-10-28 ENCOUNTER — Other Ambulatory Visit: Payer: Self-pay

## 2014-11-01 ENCOUNTER — Encounter: Payer: Self-pay | Admitting: Internal Medicine

## 2014-11-02 ENCOUNTER — Ambulatory Visit (INDEPENDENT_AMBULATORY_CARE_PROVIDER_SITE_OTHER): Payer: Medicare Other | Admitting: Pharmacist

## 2014-11-02 DIAGNOSIS — I639 Cerebral infarction, unspecified: Secondary | ICD-10-CM | POA: Diagnosis not present

## 2014-11-02 DIAGNOSIS — I4891 Unspecified atrial fibrillation: Secondary | ICD-10-CM | POA: Diagnosis not present

## 2014-11-02 DIAGNOSIS — Z5181 Encounter for therapeutic drug level monitoring: Secondary | ICD-10-CM | POA: Diagnosis not present

## 2014-11-02 DIAGNOSIS — Z7901 Long term (current) use of anticoagulants: Secondary | ICD-10-CM

## 2014-11-02 DIAGNOSIS — I6350 Cerebral infarction due to unspecified occlusion or stenosis of unspecified cerebral artery: Secondary | ICD-10-CM

## 2014-11-02 LAB — POCT INR: INR: 2.9

## 2014-11-02 NOTE — Telephone Encounter (Signed)
She has had a prior stroke  She will need bridging   THis prompts the question as to the essential nature of the procedure and is she a candidate for virtual imaging

## 2014-11-04 NOTE — Telephone Encounter (Signed)
This patient has a family history of colon cancer and she has a personal history of adenomatous polyps.  As with all our patients, we have to evaluate the risk/benefit.  Unfortunately virtual colonoscopy (CT colonography) is not reimbursable.  Alternatively, we can check a Cologuard (stool test) which is highly sensitive for detecting advanced adenomas and carcinomas.  If positive then we can make a more compelling case to proceed with colonoscopy.  If you concur I would be happy to make that recommendation .

## 2014-11-07 NOTE — Telephone Encounter (Addendum)
Pt requested to change to Eliquis at her last anti-coag visit on 2/10 to avoid Lovenox bridge.  Change was made.  Would suggest holding after AM dose on 3/6 (would be ~48 hours) prior to procedure so there is no need for Lovenox bridging.

## 2014-11-07 NOTE — Telephone Encounter (Signed)
Kayla Harrison, I am unsure whether you are in favor of doing a Cologuard versus screening colonoscopy.  Thoughts?  Rob

## 2014-11-10 IMAGING — CR DG CHEST 2V
2 series · 2 of 2 positions shown · non-contrast
Comparison: Chest x-ray 11/08/2012.

CLINICAL DATA: Bronchitis.  Cough and shortness of breath.  Cardiac
surgery 5 weeks ago.

CHEST - 2 VIEW

[view not recorded (1 of 2)]
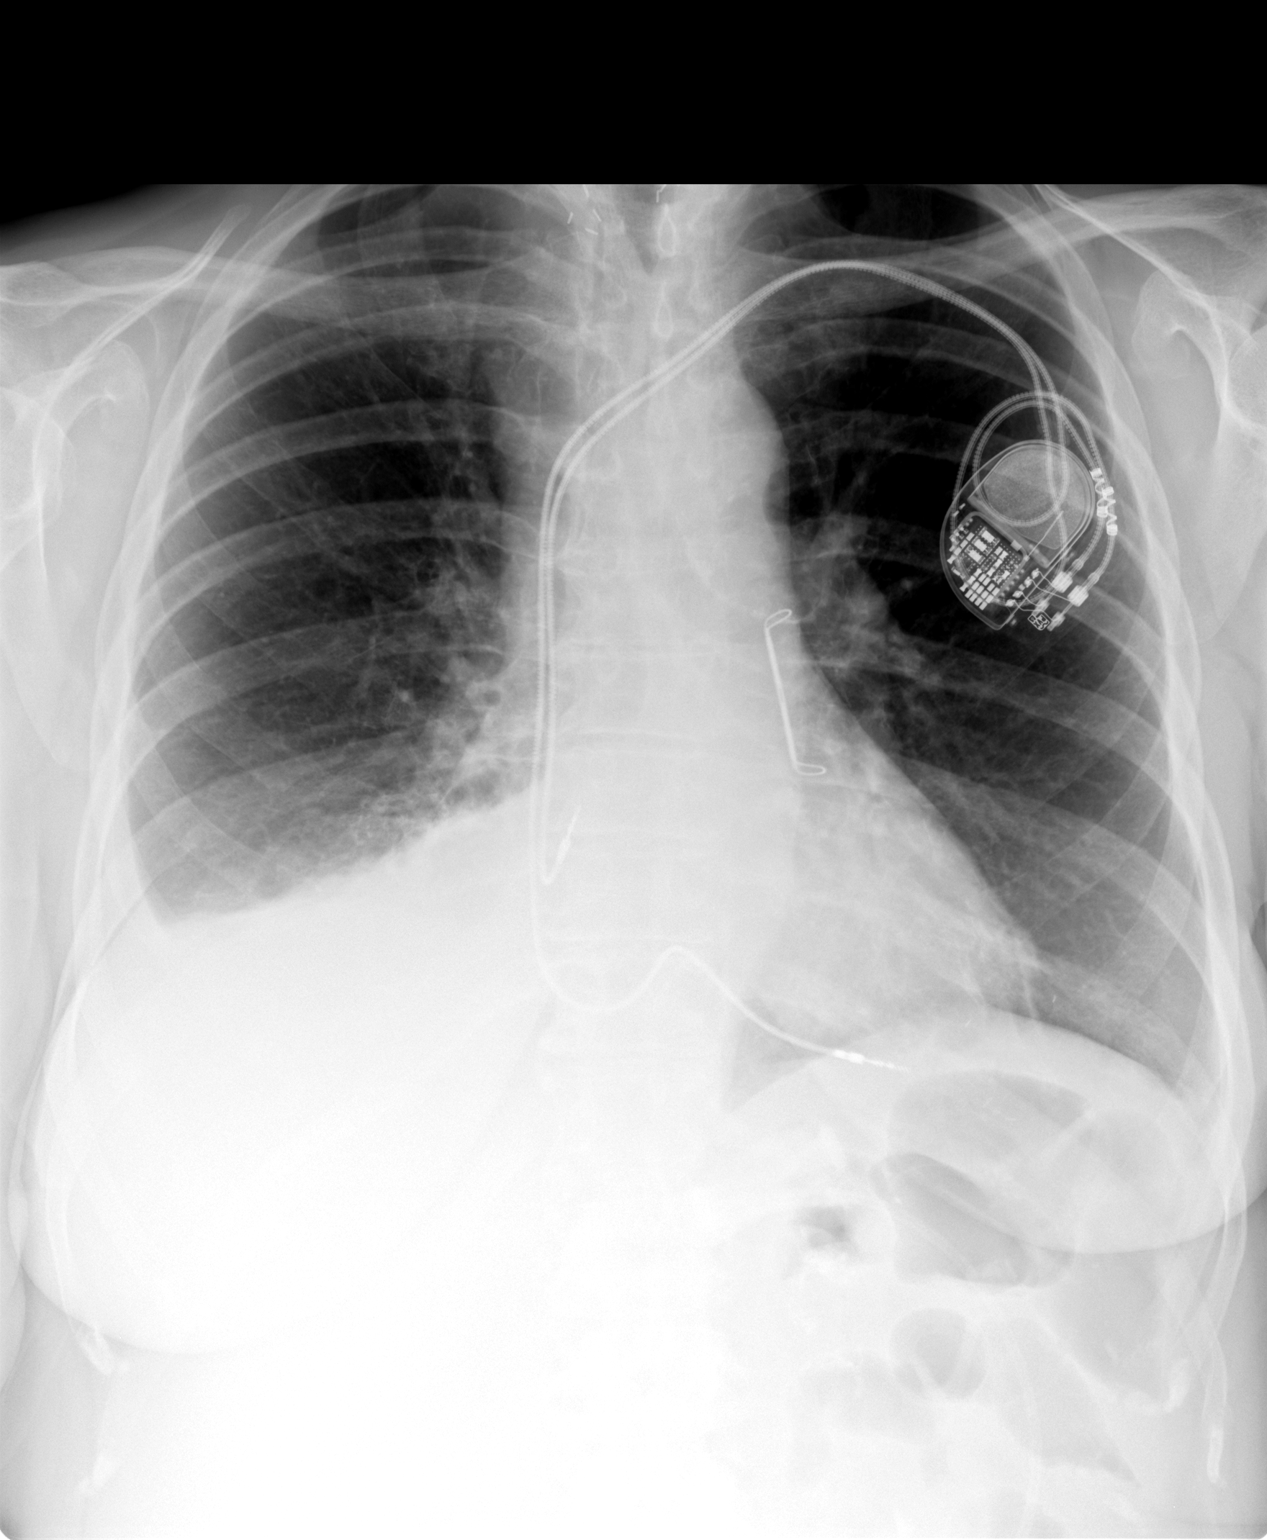

[view not recorded (2 of 2)]
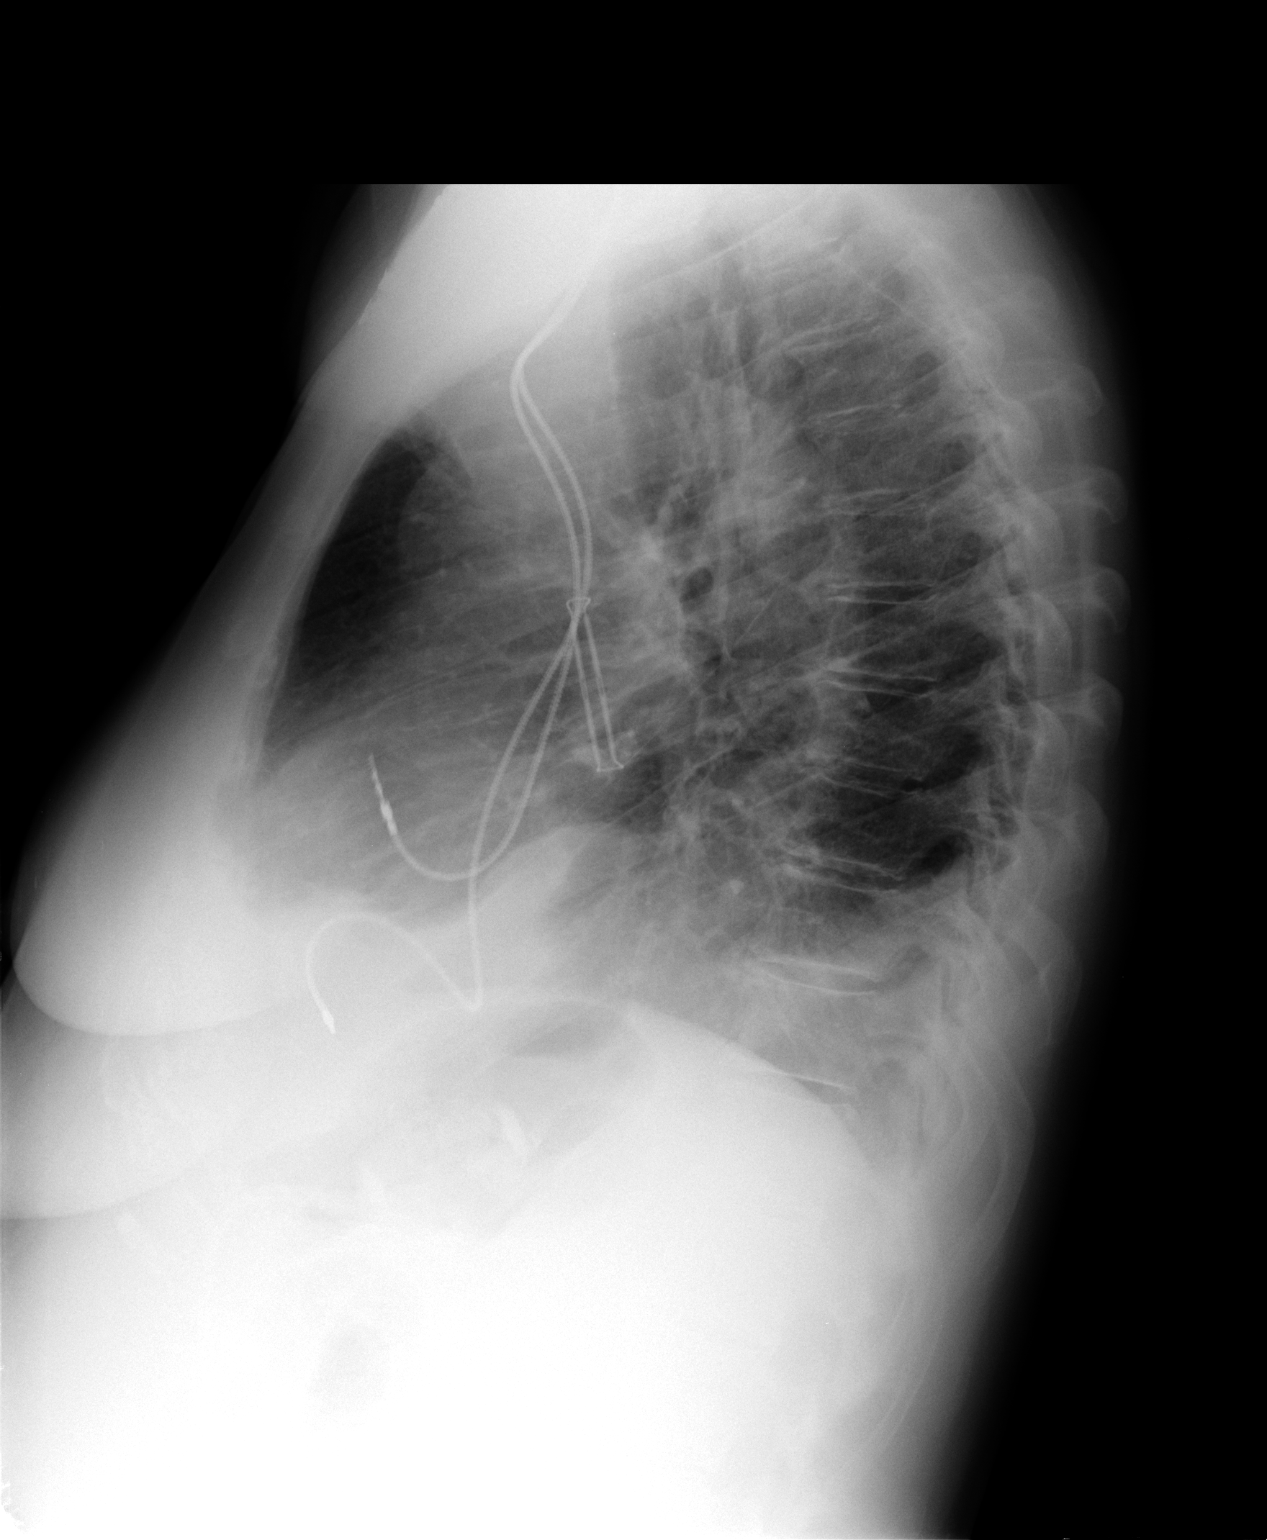

[2 of 2 positions shown; findings below may reference images not displayed]

FINDINGS: Compared to the prior examination there are now
postoperative changes of left atrial appendage ligation (a ligation
clip is noted), as well as pacemaker placement with a left-sided
pacemaker device in place with lead tips projecting over the
expected location of the right atrial appendage and right
ventricular apex.  Opacity at the base of the right hemithorax may
represent atelectasis and/or consolidation, with superimposed
moderate right-sided pleural effusion which is new.  Left lung is
clear.  No evidence of pulmonary edema.  Heart size is normal.
Upper mediastinal contours are within normal limits.  Surgical
clips are noted at the level of the thoracic inlet, likely from
prior thyroid surgery.
IMPRESSION: 1.  Support apparatus and postoperative changes, as above.
2.  Interval development of moderate right-sided pleural effusion
with associated atelectasis and/or consolidation in the right base.

## 2014-11-14 ENCOUNTER — Telehealth: Payer: Self-pay | Admitting: Pharmacist

## 2014-11-14 IMAGING — CR DG CHEST DECUBITUS BILAT
3 series · 3 of 3 positions shown · non-contrast
Comparison: None.

CLINICAL DATA: Effusion.  Recent abnormal chest radiograph.

CHEST - BILATERAL DECUBITUS VIEW

[x chest decub (1 of 3)]
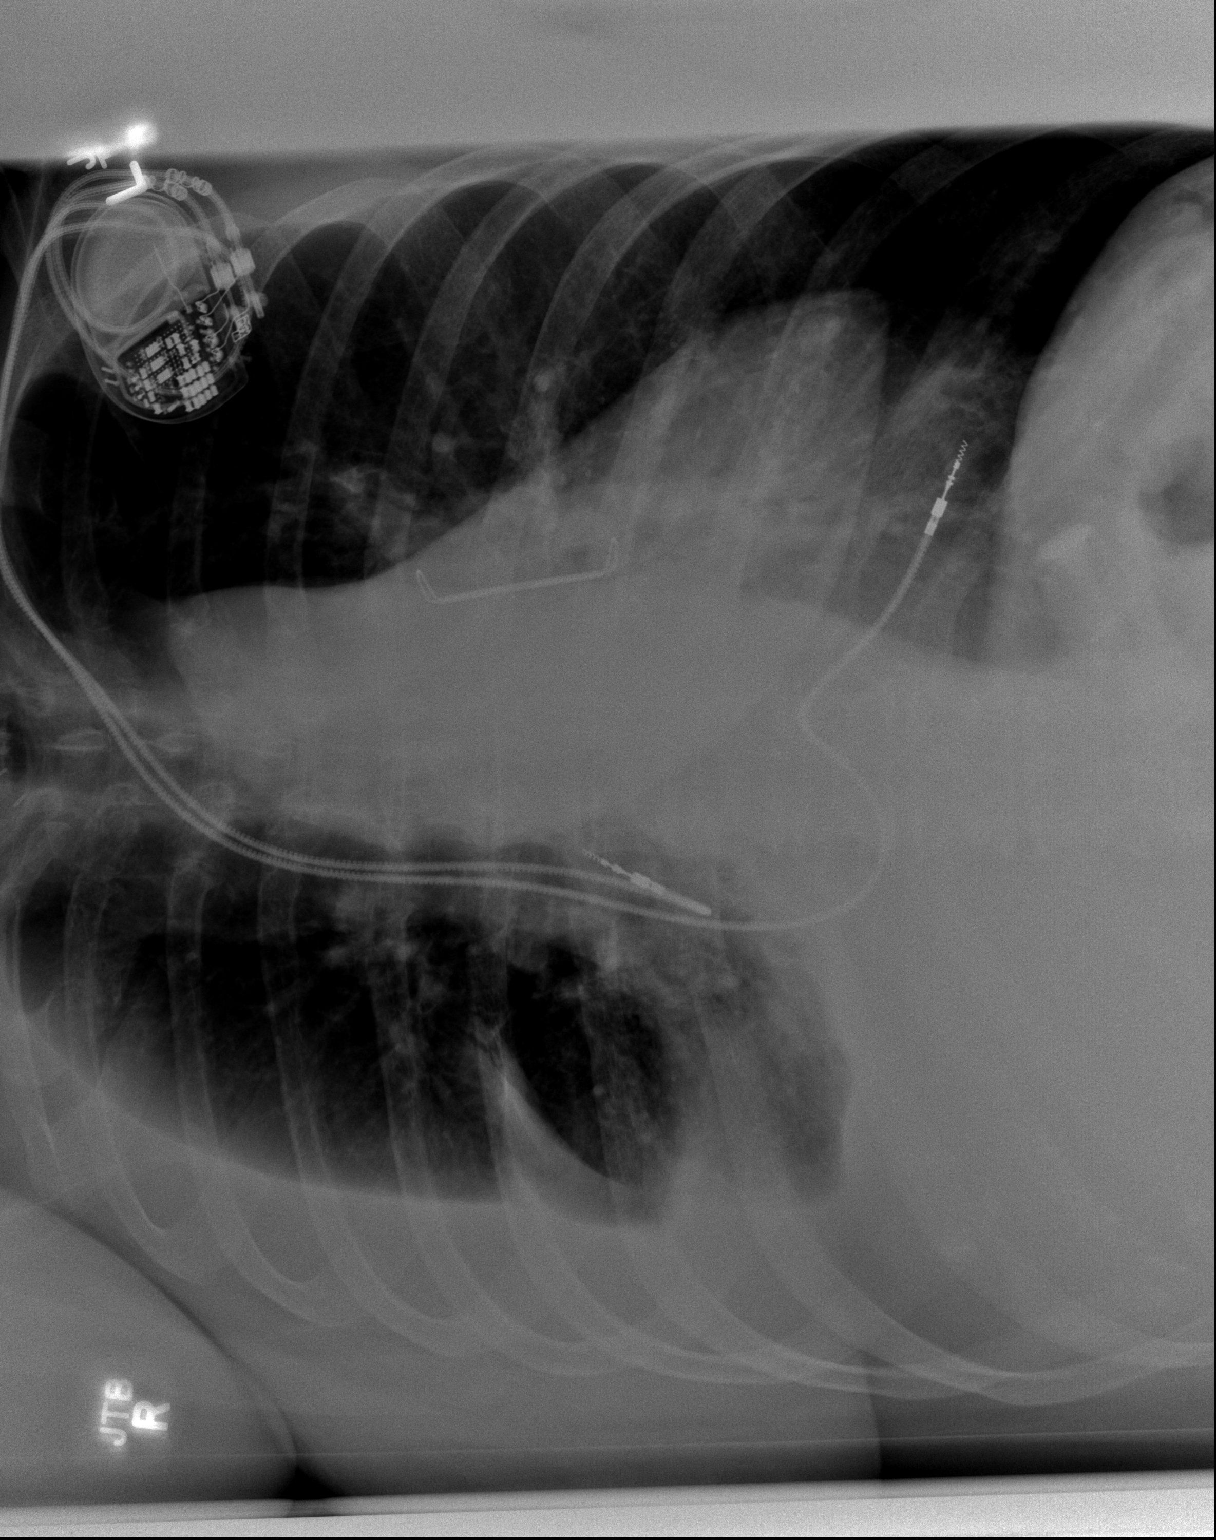

[x chest decub (2 of 3)]
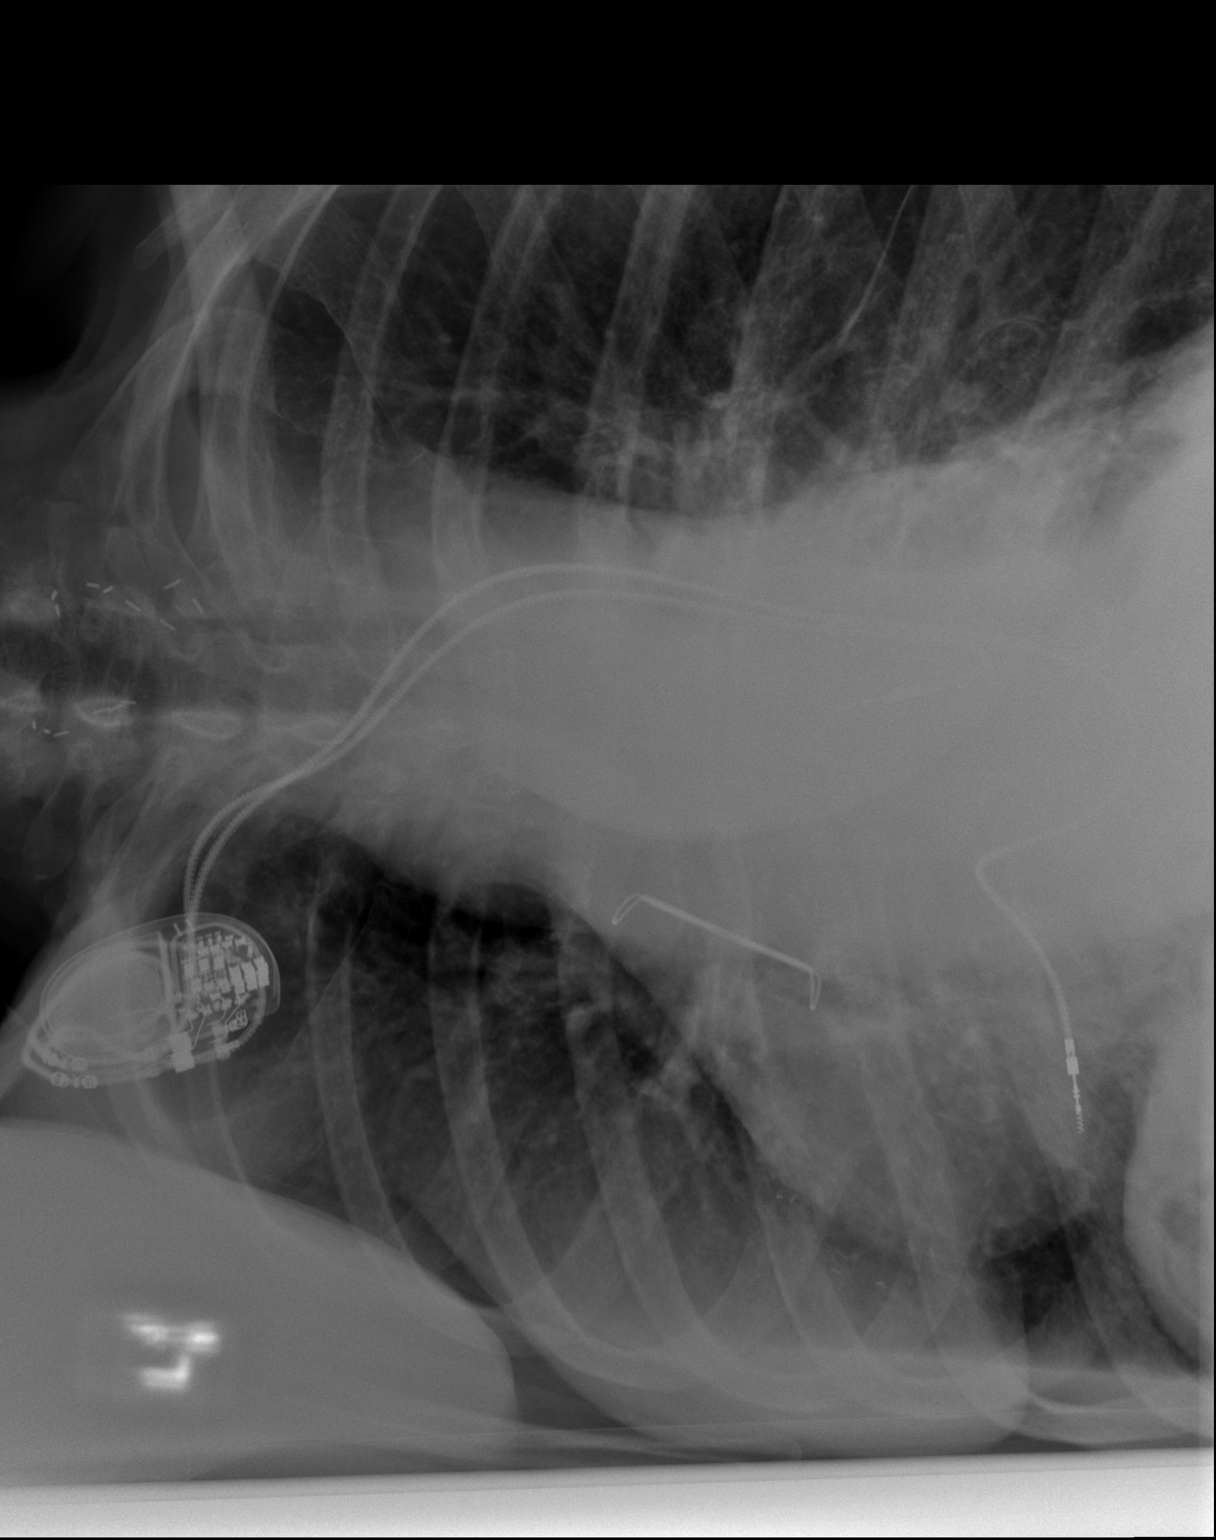

[x chest decub (3 of 3)]
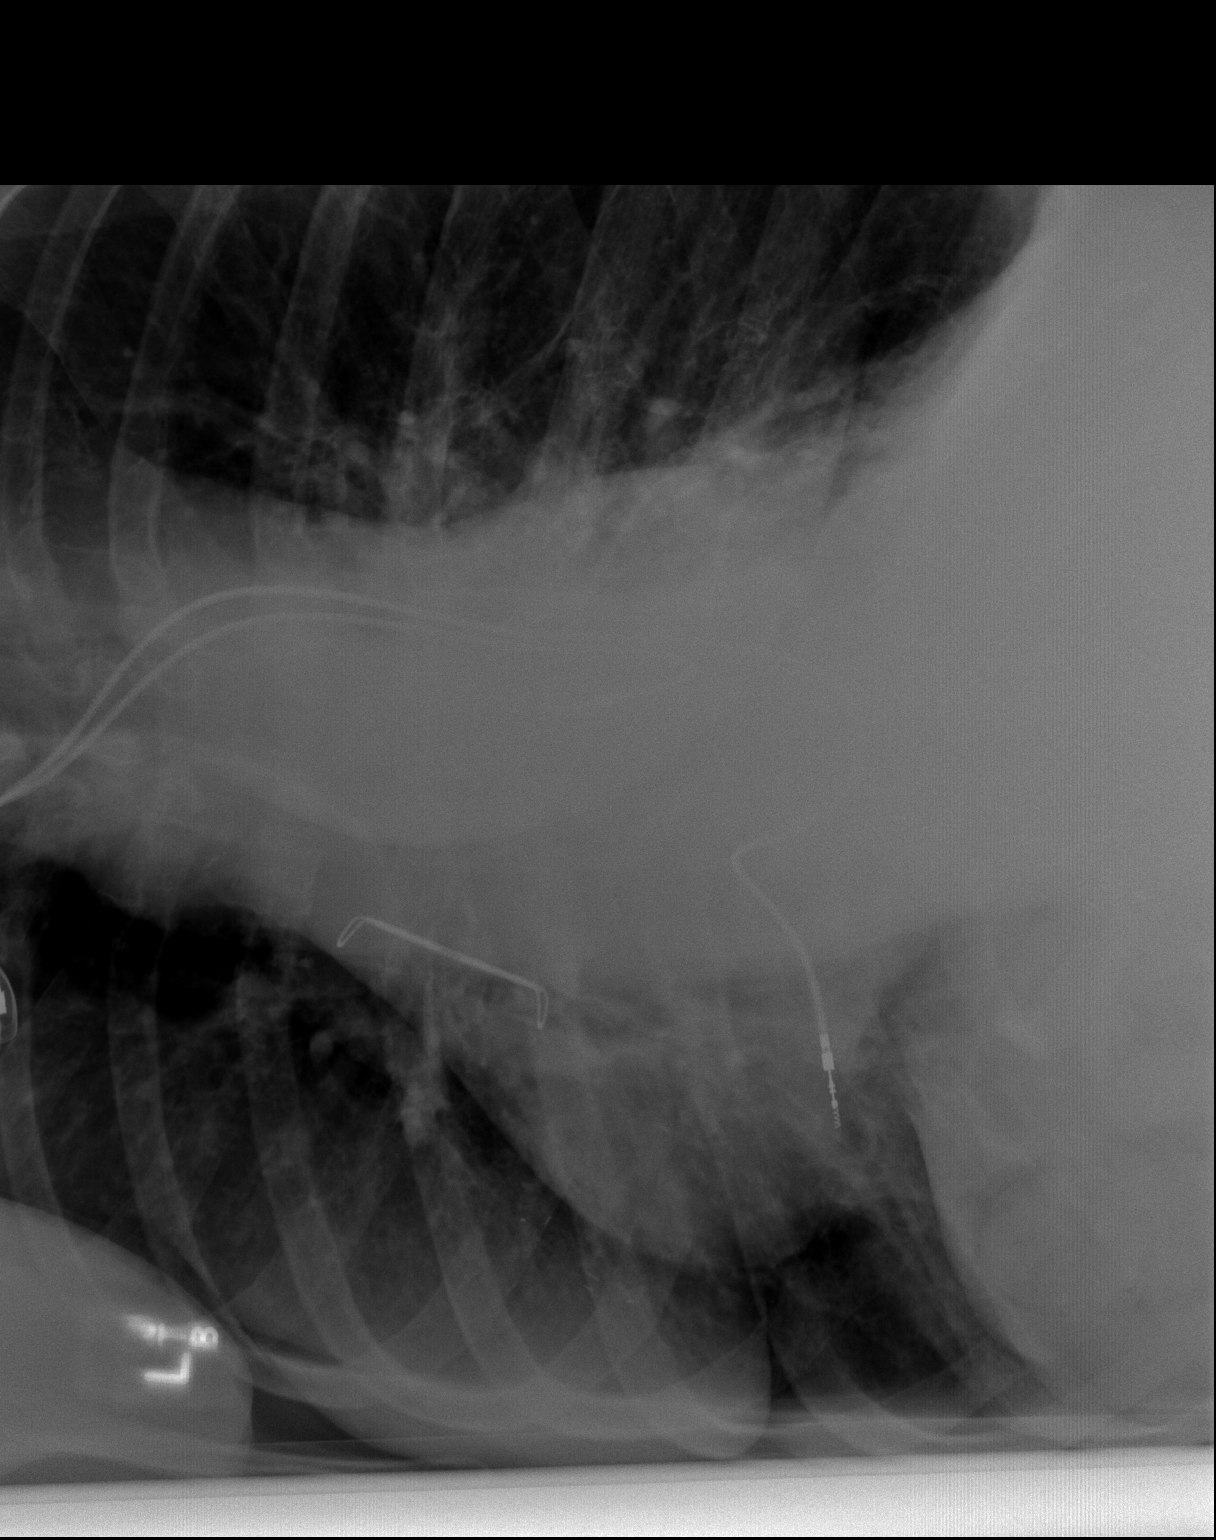

[3 of 3 positions shown; findings below may reference images not displayed]

FINDINGS: Bilateral decubitus views of the chest demonstrate
bilateral simple appearing layering pleural effusions, larger on
the right than the left. The effusions are estimated to be a
moderate in size on the right and small on the left.  Left atrial
appendage ligation clip noted.

A right basilar opacity is noted and could reflect atelectasis or
airspace disease.
IMPRESSION: Layering bilateral pleural effusions, moderate on the
right and small on the left. A right basilar opacity could reflect
atelectasis or airspace consolidation.

## 2014-11-14 NOTE — Telephone Encounter (Signed)
Pt called to state she has been having hematuria since starting Eliquis.  She states it started 1-2 days after changing medications and has not improved.  She states she has had this issue in the past and it was due to a kidney stone.  Given Eliquis typically has less bleeding than warfarin, I am suspicious of some other reason for bleeding.  Suggest patient call PCP or urologist for evaluation.  If they are unable to find a source for the bleeding or believe it is from anticoagulation, will have to change back to warfarin.

## 2014-11-15 ENCOUNTER — Ambulatory Visit (INDEPENDENT_AMBULATORY_CARE_PROVIDER_SITE_OTHER): Payer: Medicare Other | Admitting: Internal Medicine

## 2014-11-15 ENCOUNTER — Encounter: Payer: Self-pay | Admitting: Internal Medicine

## 2014-11-15 ENCOUNTER — Telehealth: Payer: Self-pay | Admitting: Pharmacist Clinician (PhC)/ Clinical Pharmacy Specialist

## 2014-11-15 ENCOUNTER — Other Ambulatory Visit (INDEPENDENT_AMBULATORY_CARE_PROVIDER_SITE_OTHER): Payer: Medicare Other

## 2014-11-15 VITALS — BP 132/82 | HR 69 | Temp 97.5°F | Ht 66.0 in | Wt 243.2 lb

## 2014-11-15 DIAGNOSIS — I639 Cerebral infarction, unspecified: Secondary | ICD-10-CM

## 2014-11-15 DIAGNOSIS — R310 Gross hematuria: Secondary | ICD-10-CM

## 2014-11-15 DIAGNOSIS — I4891 Unspecified atrial fibrillation: Secondary | ICD-10-CM | POA: Diagnosis not present

## 2014-11-15 DIAGNOSIS — N200 Calculus of kidney: Secondary | ICD-10-CM

## 2014-11-15 LAB — CBC WITH DIFFERENTIAL/PLATELET
Basophils Absolute: 0 10*3/uL (ref 0.0–0.1)
Basophils Relative: 0.1 % (ref 0.0–3.0)
Eosinophils Absolute: 0.3 10*3/uL (ref 0.0–0.7)
Eosinophils Relative: 4.4 % (ref 0.0–5.0)
HCT: 39 % (ref 36.0–46.0)
HEMOGLOBIN: 13 g/dL (ref 12.0–15.0)
Lymphocytes Relative: 14.3 % (ref 12.0–46.0)
Lymphs Abs: 0.9 10*3/uL (ref 0.7–4.0)
MCHC: 33.3 g/dL (ref 30.0–36.0)
MCV: 86.3 fl (ref 78.0–100.0)
MONOS PCT: 11.1 % (ref 3.0–12.0)
Monocytes Absolute: 0.7 10*3/uL (ref 0.1–1.0)
Neutro Abs: 4.3 10*3/uL (ref 1.4–7.7)
Neutrophils Relative %: 70.1 % (ref 43.0–77.0)
PLATELETS: 219 10*3/uL (ref 150.0–400.0)
RBC: 4.52 Mil/uL (ref 3.87–5.11)
RDW: 14.1 % (ref 11.5–15.5)
WBC: 6.1 10*3/uL (ref 4.0–10.5)

## 2014-11-15 LAB — URINALYSIS, ROUTINE W REFLEX MICROSCOPIC
Bilirubin Urine: NEGATIVE
SPECIFIC GRAVITY, URINE: 1.025 (ref 1.000–1.030)
pH: 6.5 (ref 5.0–8.0)

## 2014-11-15 NOTE — Telephone Encounter (Signed)
Pt called, has been having hematuria since starting Eliquis, would like to get off and back onto warfarin.  Warfarin was d/c'd on 2/10 at dose of 2.5 mg qd x 5 mg MF.  Saw Dr. Linna Darner today and was advised to stop Eliquis and Celebrex.  Restart warfarin today at 5mg  qd x 4 then resume 2.5 mg qd x 5 mg MF.  Continue Eliquis thru Friday am.   Return for INR on Monday at 11:30.    Pt voiced understanding

## 2014-11-15 NOTE — Progress Notes (Signed)
Pre visit review using our clinic review tool, if applicable. No additional management support is needed unless otherwise documented below in the visit note. 

## 2014-11-15 NOTE — Patient Instructions (Signed)
Please stop Eliquis and restart warfarin 11/16/14. Please do not take the Celebrex.  I will contact Dr. Caryl Comes with these results and you'll be informed of the appropriate course of therapy.

## 2014-11-15 NOTE — Progress Notes (Signed)
   Subjective:    Patient ID: Kayla Harrison, female    DOB: [birth year], 69 y.o.   MRN: [id number]  HPI  She she began to have frank hematuria which was "like a period" 11/03/14. The blood is bright red and occurs with each urination.  Warfarin was discontinued 2/10 &  Eliquis was initiated. She has had ablation twice (once "internal & external")and now has a pacemaker. Eliquis was felt to be safer than warfarin long term by Dr Caryl Comes & the Pharmacologist. She describes some right sided flank pain which is only a level I and more of a "tenderness". She describes frequency but not dysuria or pyuria.  Past history includes one kidney stone which is too large to pass. She has other stones as well. (See 09/10/2012 CAT scan ; data entered into Problem List). She saw a Dealer in 2014. Cystoscopy was performed 2006.  She is on Celebrex twice a day. PMH of 6 hour nose bleed on Xarelto.  Review of Systems Epistaxis, hemoptysis,melena, or rectal bleeding denied. No unexplained weight loss, significant dyspepsia,dysphagia, or abdominal pain.  There is no abnormal bruising , bleeding, or difficulty stopping bleeding with injury.     Objective:   Physical Exam  Pertinent or positive findings include: She does appear pale. Thyroid absent surgically. There is splitting of the first heart sound with slight slurring versus a grade 1/2 systolic murmur at the right base. The slight tenderness to percussion over the right flank. There is asymmetry of the lower thoracic musculature; the muscles in the left are more prominent than the right. Knees are fusiformly ,massively enlarged.  General appearance BMI 39.28;adequately nourished; in no distress. Eyes: No conjunctival inflammation or scleral icterus is present. Oral exam: Dental hygiene is good. Lips and gums are healthy appearing.There is no oropharyngeal erythema or exudate noted.  Heart:  Normal rate and regular rhythm. S2 normal without  gallop, click, rub or other extra sounds   Lungs:Chest clear to auscultation; no wheezes, rhonchi,rales ,or rubs present.No increased work of breathing.  Abdomen: bowel sounds normal, soft and non-tender without masses, organomegaly or hernias noted.  No guarding or rebound. Vascular : all pulses equal ; no bruits present. Skin:Warm & dry.  Intact without suspicious lesions or rashes ; no jaundice or tenting Lymphatic: No lymphadenopathy is noted about the head, neck, axilla Neuro: Strength, tone  normal.         Assessment & Plan:  #1 hematuria with every void since Eliquis started 11/03/14.  #2 history of retained renal calculi  Plan: Eliquis will be discontinued.  Warfarin will be initiated 2/24 .  CBC and differential, urinalysis, and culture will be collected. Urology re-evaluation indicated prior to re-initiation of Eliquis

## 2014-11-16 ENCOUNTER — Encounter: Payer: Self-pay | Admitting: Internal Medicine

## 2014-11-16 DIAGNOSIS — N200 Calculus of kidney: Secondary | ICD-10-CM | POA: Insufficient documentation

## 2014-11-16 DIAGNOSIS — Z87442 Personal history of urinary calculi: Secondary | ICD-10-CM | POA: Insufficient documentation

## 2014-11-17 ENCOUNTER — Encounter: Payer: Self-pay | Admitting: Internal Medicine

## 2014-11-17 ENCOUNTER — Encounter: Payer: Self-pay | Admitting: Pharmacist

## 2014-11-17 ENCOUNTER — Telehealth: Payer: Self-pay | Admitting: Physician Assistant

## 2014-11-17 ENCOUNTER — Other Ambulatory Visit: Payer: Self-pay | Admitting: Physician Assistant

## 2014-11-17 ENCOUNTER — Other Ambulatory Visit: Payer: Self-pay | Admitting: Internal Medicine

## 2014-11-17 DIAGNOSIS — N200 Calculus of kidney: Secondary | ICD-10-CM

## 2014-11-17 LAB — CULTURE, URINE COMPREHENSIVE
Colony Count: NO GROWTH
Organism ID, Bacteria: NO GROWTH

## 2014-11-17 MED ORDER — APIXABAN 5 MG PO TABS: 5.0000 mg | ORAL_TABLET | Freq: Two times a day (BID) | ORAL | Status: DC

## 2014-11-17 NOTE — Telephone Encounter (Signed)
    Patient was bridging to coumadin due to hematuria on eliquis. Now she believes that it is the celebrex that was causing her problems and wants to stay on eliquis. Her hematuria has completely resolved off celebrex. She will now discontinued coumadin and continue only on Eliquis. I have sent in a refill to her pharmacy. I will forward this to Dr. Linna Darner, Dr. Caryl Comes and Tommy Medal pharmD.  Angelena Form PA-C  [facility]

## 2014-11-20 IMAGING — CR DG CHEST 2V
2 series · 2 of 2 positions shown · non-contrast
Comparison: An 04/09/2013

CLINICAL DATA: Dyspnea, pleural effusion

CHEST - 2 VIEW

[view not recorded (1 of 2)]
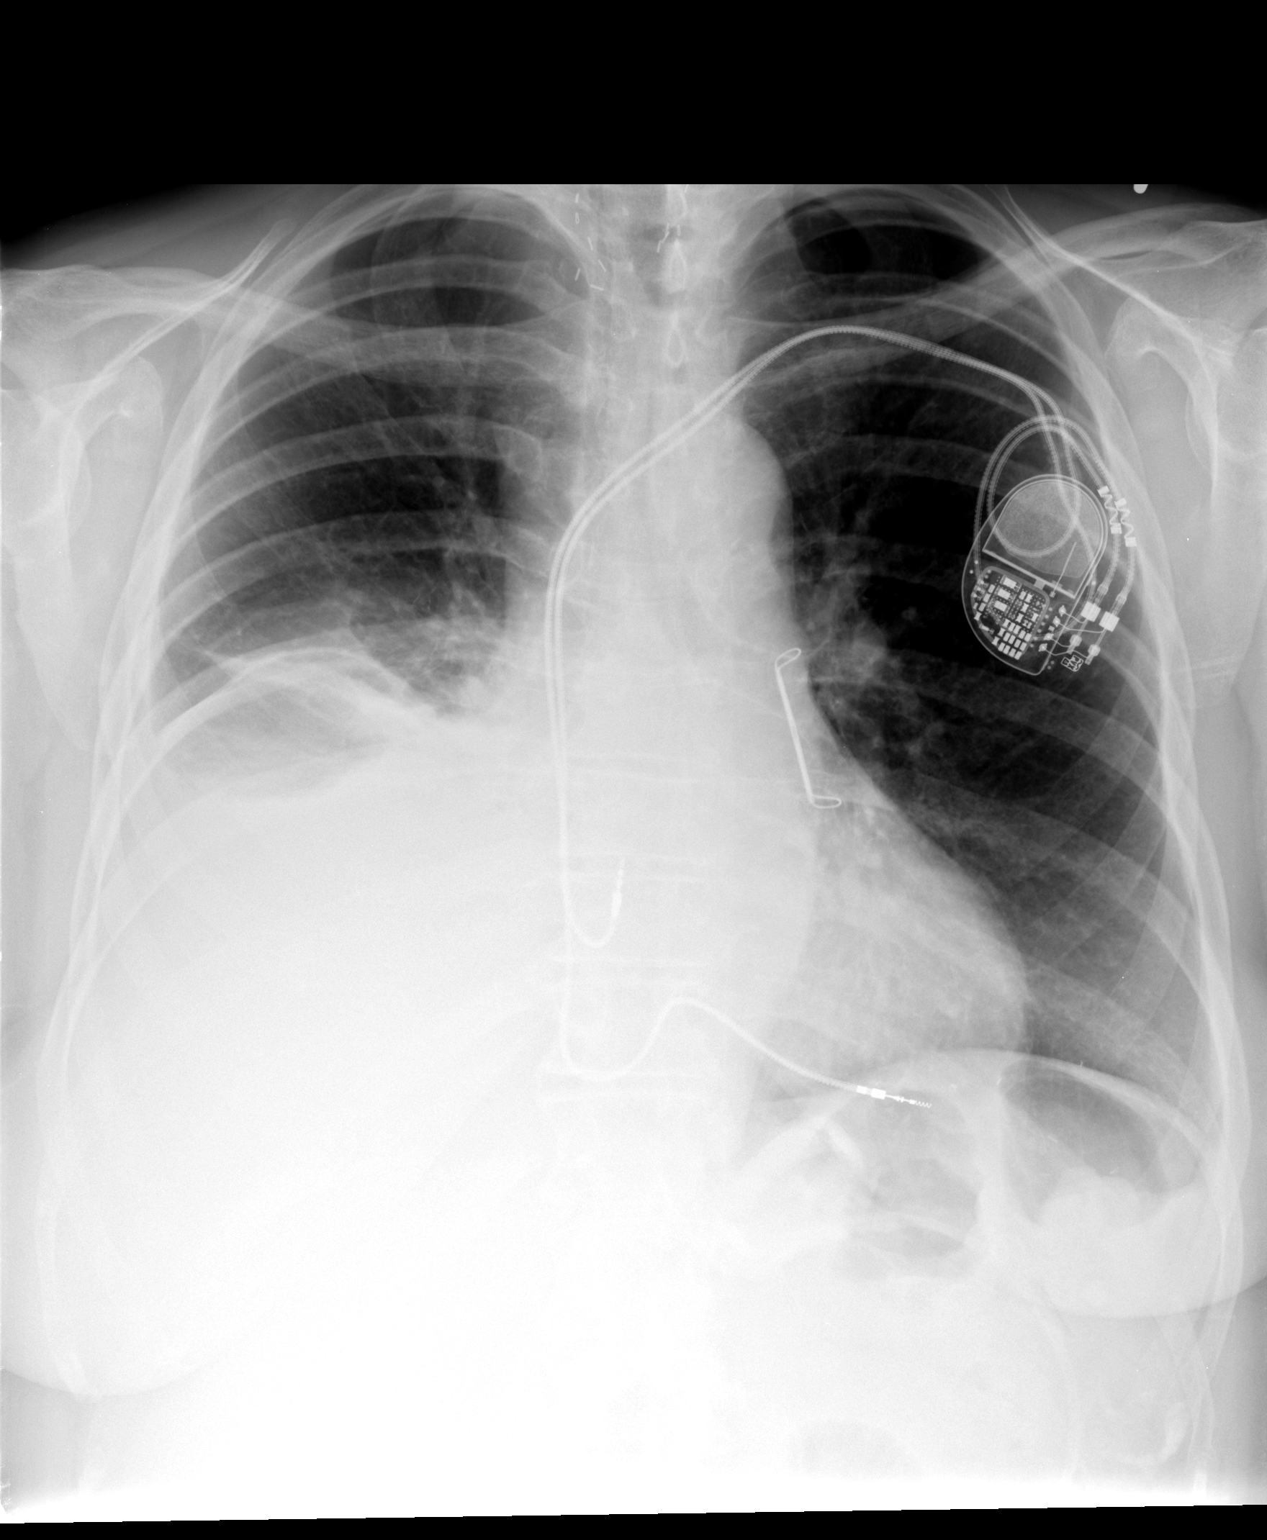

[view not recorded (2 of 2)]
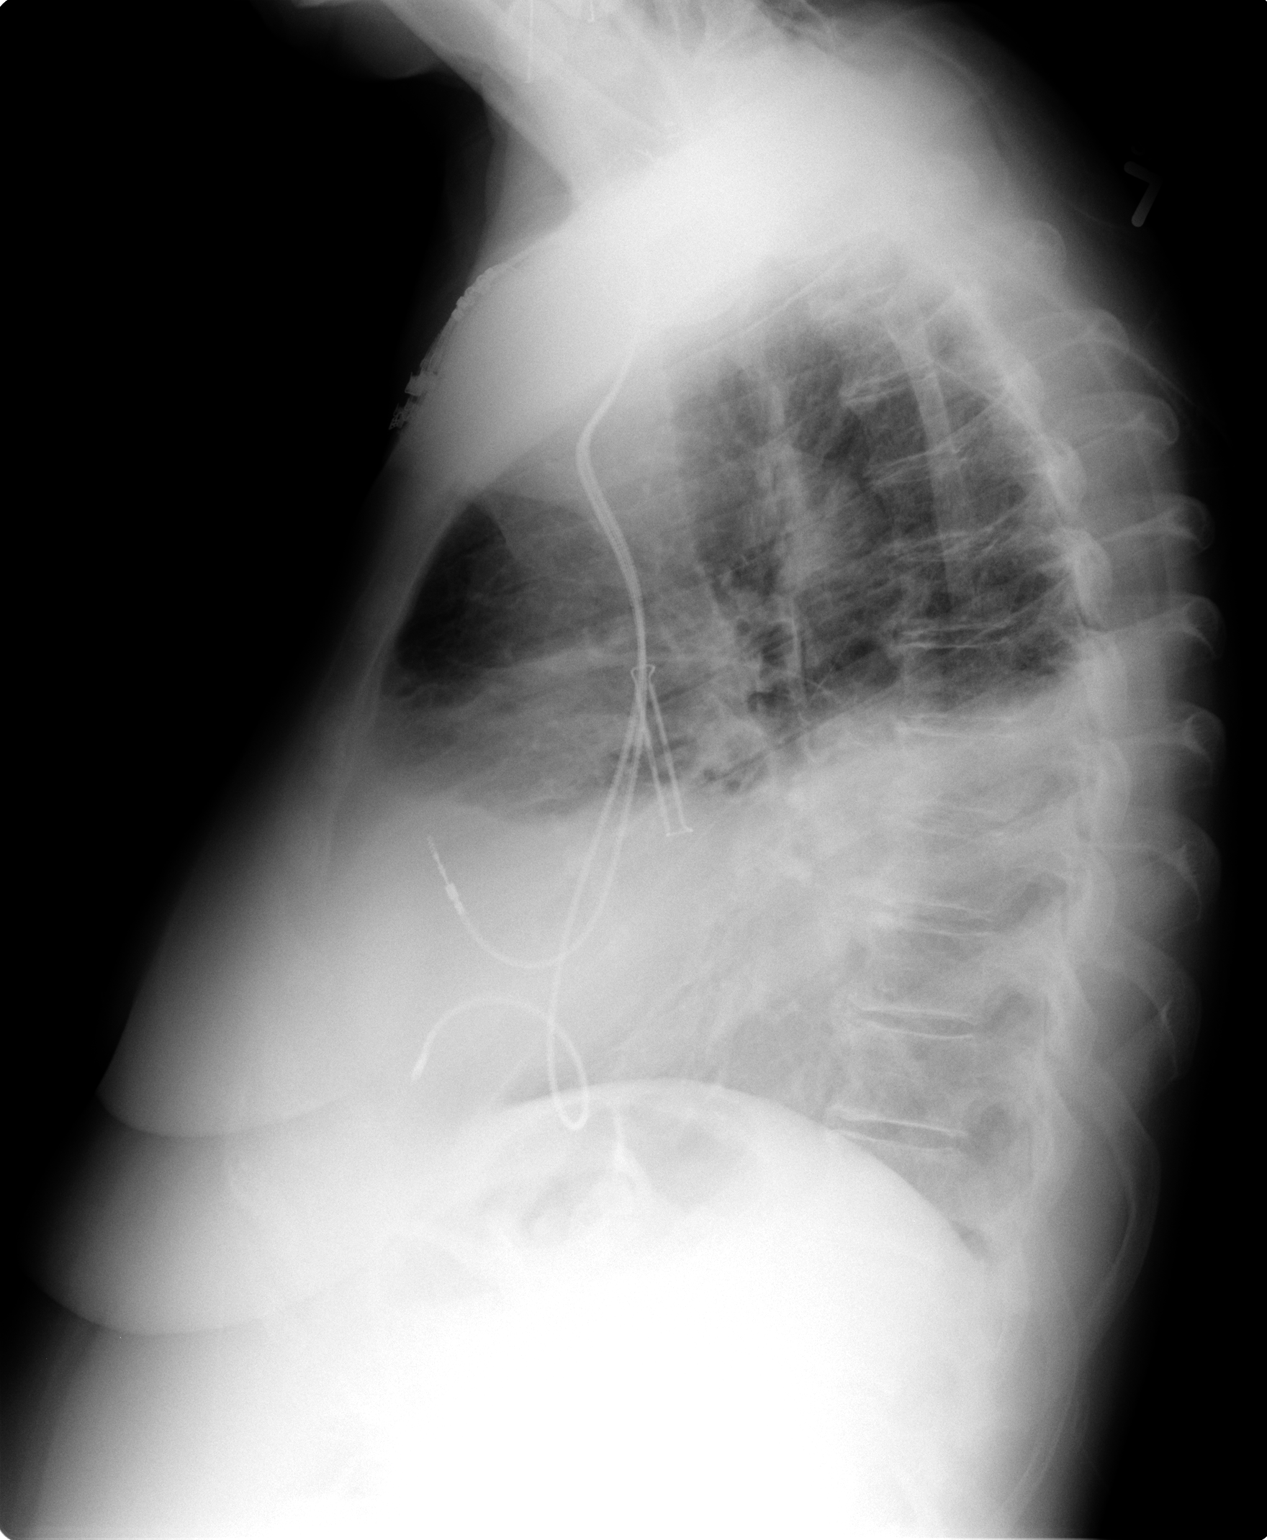

[2 of 2 positions shown; findings below may reference images not displayed]

FINDINGS: The heart size and vascular pattern are normal.  Cardiac
pacer is in unchanged position.  The left lung is clear.  There is
a moderate-to-large right pleural effusion which is mildly to
moderately larger when compared to the prior study.
IMPRESSION: Moderate to large right pleural effusion increased from the prior
examination.

## 2014-11-21 ENCOUNTER — Ambulatory Visit (INDEPENDENT_AMBULATORY_CARE_PROVIDER_SITE_OTHER): Payer: Medicare Other | Admitting: Pharmacist

## 2014-11-21 DIAGNOSIS — I4891 Unspecified atrial fibrillation: Secondary | ICD-10-CM | POA: Diagnosis not present

## 2014-11-21 DIAGNOSIS — I639 Cerebral infarction, unspecified: Secondary | ICD-10-CM | POA: Diagnosis not present

## 2014-11-21 DIAGNOSIS — Z7901 Long term (current) use of anticoagulants: Secondary | ICD-10-CM | POA: Diagnosis not present

## 2014-11-21 DIAGNOSIS — I6350 Cerebral infarction due to unspecified occlusion or stenosis of unspecified cerebral artery: Secondary | ICD-10-CM

## 2014-11-21 LAB — BASIC METABOLIC PANEL
BUN: 28 mg/dL — ABNORMAL HIGH (ref 6–23)
CALCIUM: 10.3 mg/dL (ref 8.4–10.5)
CO2: 28 mEq/L (ref 19–32)
CREATININE: 1.35 mg/dL — AB (ref 0.40–1.20)
Chloride: 104 mEq/L (ref 96–112)
GFR: 41.15 mL/min — ABNORMAL LOW (ref >60.00)
Glucose, Bld: 103 mg/dL — ABNORMAL HIGH (ref 70–99)
Potassium: 4.4 mEq/L (ref 3.5–5.1)
Sodium: 139 mEq/L (ref 135–145)

## 2014-11-21 IMAGING — CR DG CHEST 1V PORT
1 series · 1 of 1 positions shown · non-contrast
Comparison: Two-view chest x-ray earlier same date 5855 hours.

CLINICAL DATA: Post right thoracentesis.

PORTABLE CHEST - 1 VIEW [DATE]/4716 1776 hours:

[view not recorded]
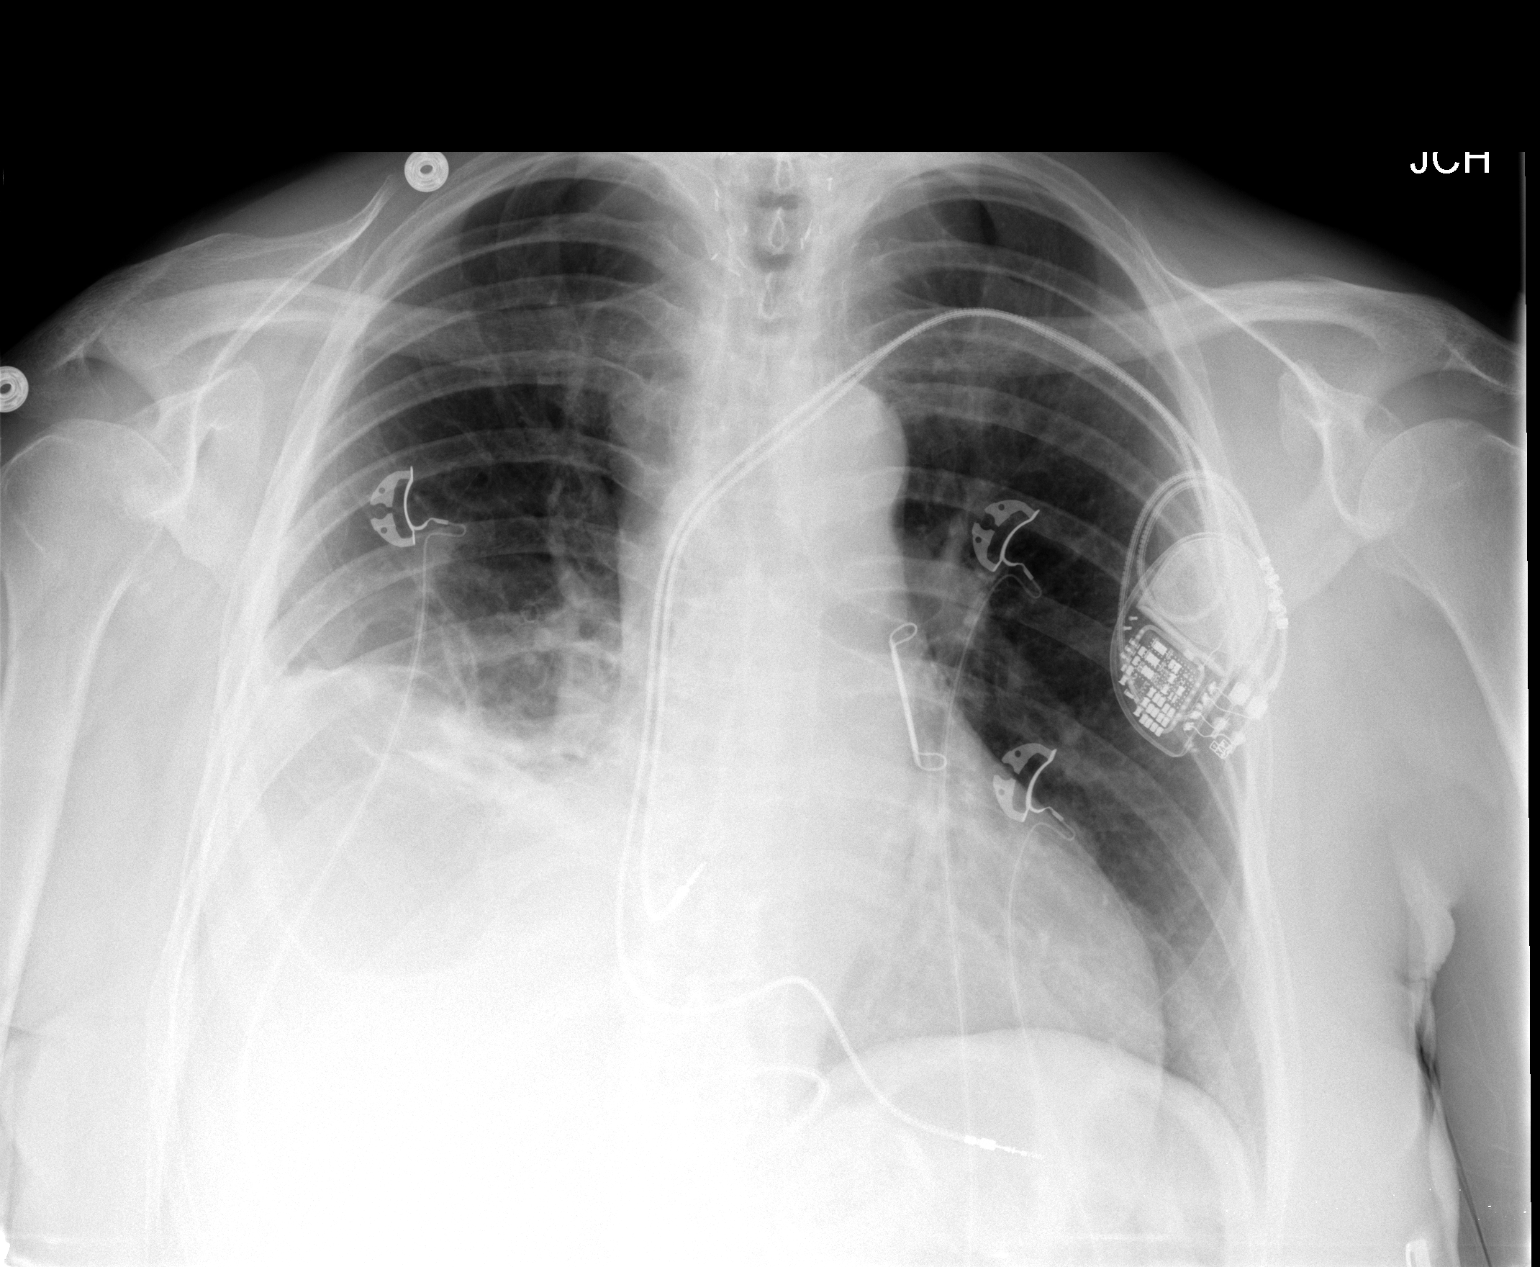

[1 of 1 positions shown; findings below may reference images not displayed]

FINDINGS: No evidence of pneumothorax after right thoracentesis.
Residual small to moderate-sized right pleural effusion.  Improved
aeration of the right lung base, though moderate passive
atelectasis persists.  Left lung remains clear.  No visible left
pleural effusion.  Cardiac silhouette upper normal in size,
unchanged.  Left subclavian dual lead transvenous pacemaker
unchanged.
IMPRESSION: No evidence of pneumothorax after right thoracentesis.  Residual
small moderate-sized right pleural effusion.  Improved aeration at
the right lung base, though moderate passive atelectasis persists.

## 2014-11-21 IMAGING — US US THORACENTESIS ASP PLEURAL SPACE W/IMG GUIDE
1 series · 4 of 4 positions shown · non-contrast
Comparison: None

CLINICAL DATA: Large right-sided pleural effusion.  Request for
diagnostic and therapeutic thoracentesis.

ULTRASOUND GUIDED right THORACENTESIS

[Series 1: us thoracentesis asp pleural space w/img guide · 0.35mm/px · 4 of 4 slices shown]
[im 1/4]
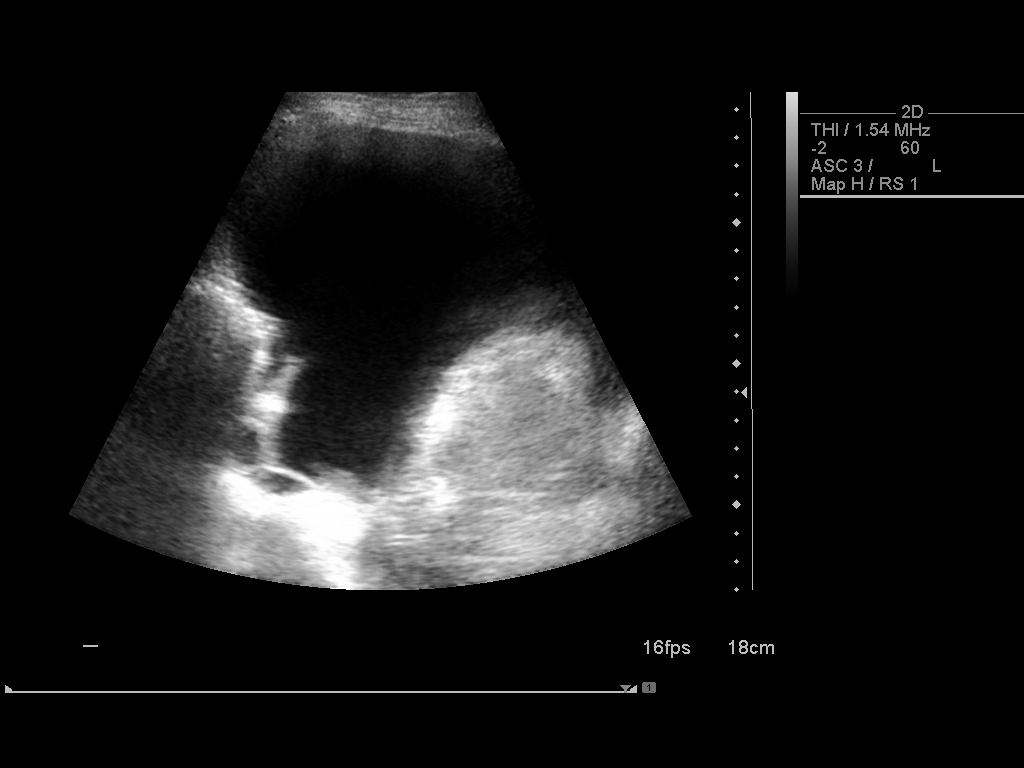
[im 2/4]
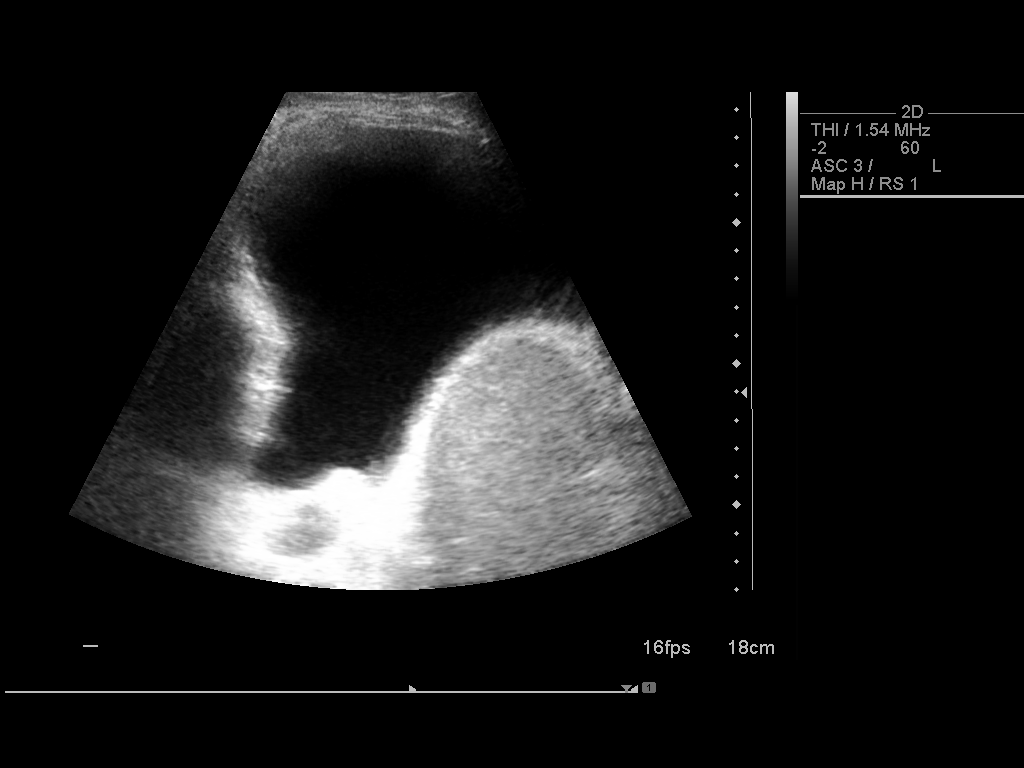
[im 3/4]
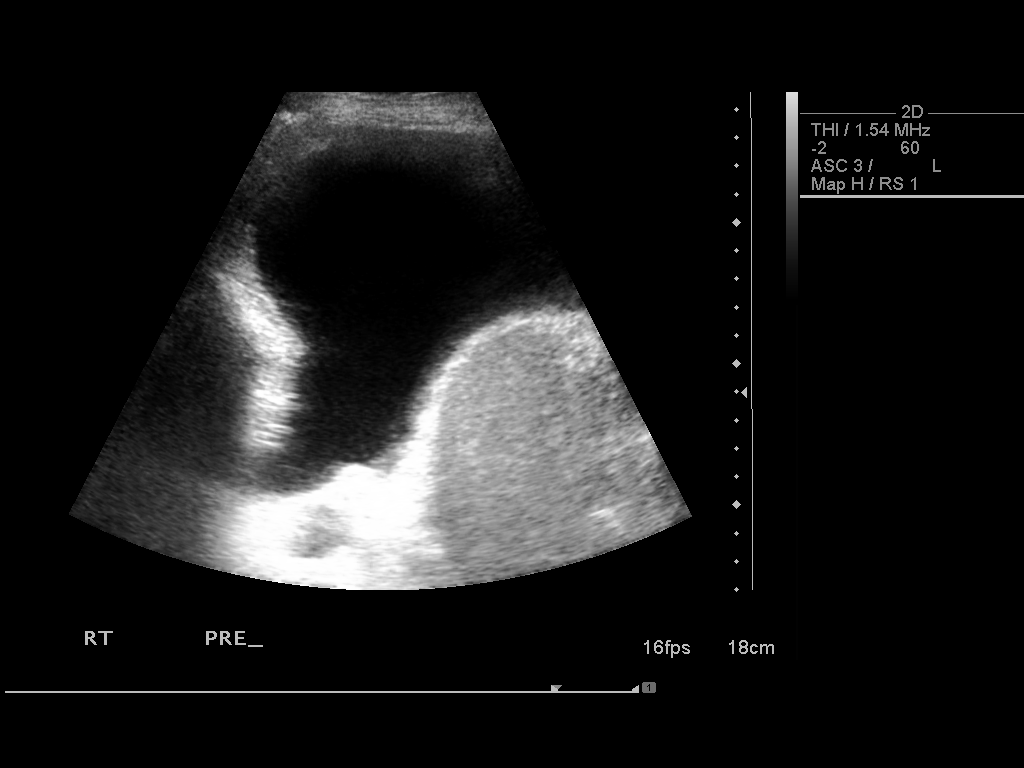
[im 4/4]
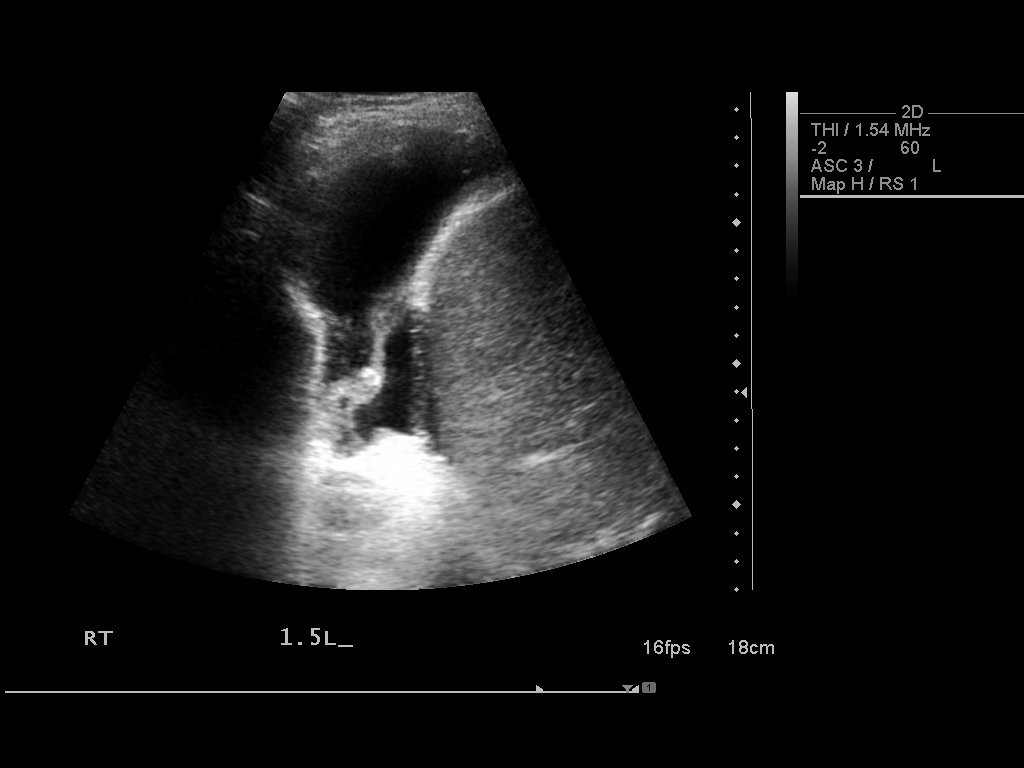

[4 of 4 positions shown; findings below may reference images not displayed]

An ultrasound guided thoracentesis was thoroughly discussed with
the patient and questions answered.  The benefits, risks,
alternatives and complications were also discussed.  The patient
understands and wishes to proceed with the procedure.  Written
consent was obtained.

Ultrasound was performed to localize and mark an adequate pocket of
fluid in the right chest.  The area was then prepped and draped in
the normal sterile fashion.  1% Lidocaine was used for local
anesthesia.  Under ultrasound guidance a 19 gauge Yueh catheter was
introduced.  Thoracentesis was performed.  The catheter was removed
and a dressing applied.

Complications:  None immediate
FINDINGS: A total of approximately 1.5 liters of blood tinged fluid
was removed. A fluid sample was sent for laboratory analysis.
IMPRESSION: Successful ultrasound guided right thoracentesis yielding
liters of pleural fluid.

Read by Jama Hass

## 2014-11-21 IMAGING — CR DG CHEST 2V
2 series · 2 of 2 positions shown · non-contrast
Comparison: Two-view chest x-ray yesterday and 04/09/2013.

CLINICAL DATA: Follow up right pleural effusion.

CHEST - 2 VIEW

[w chest pa]
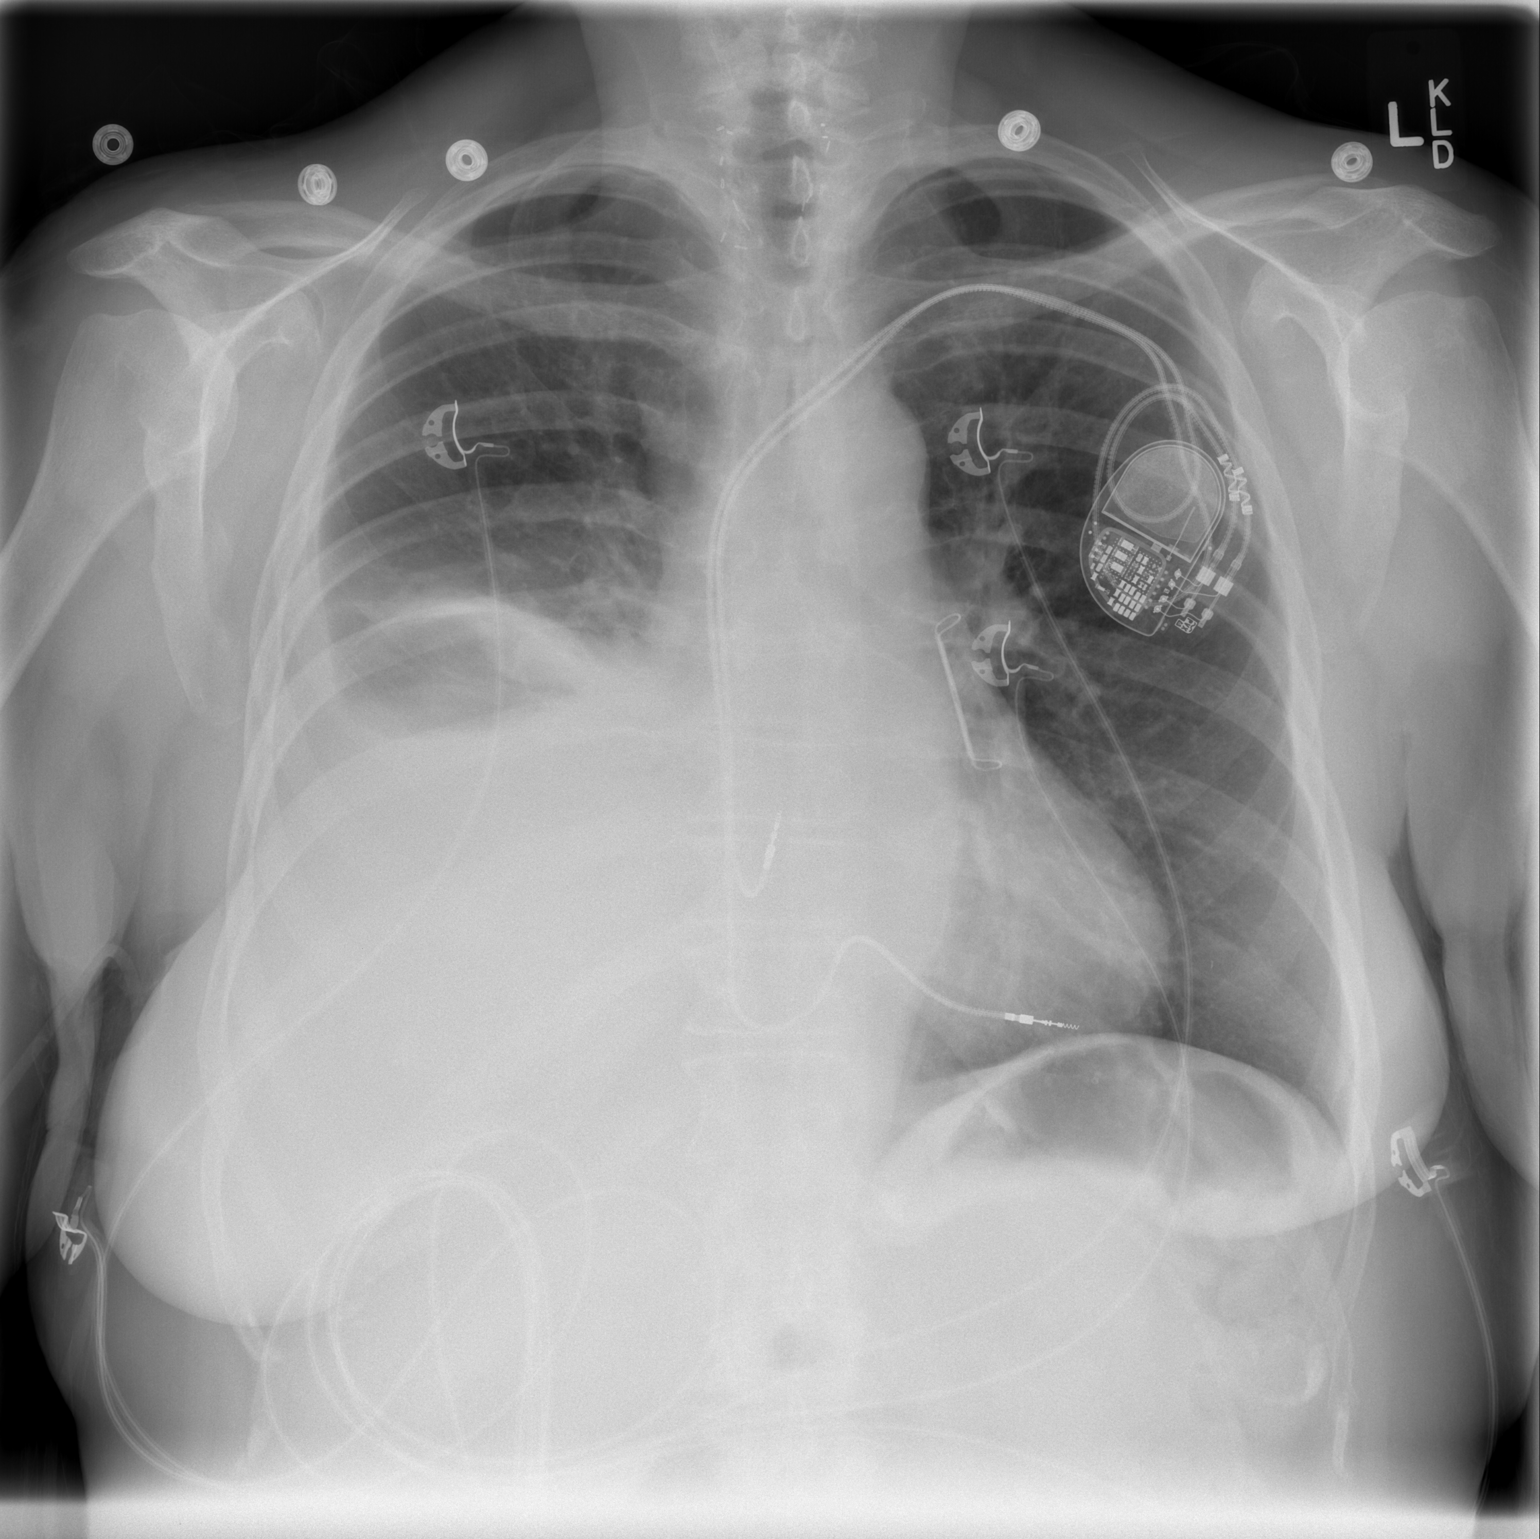

[w chest lat]
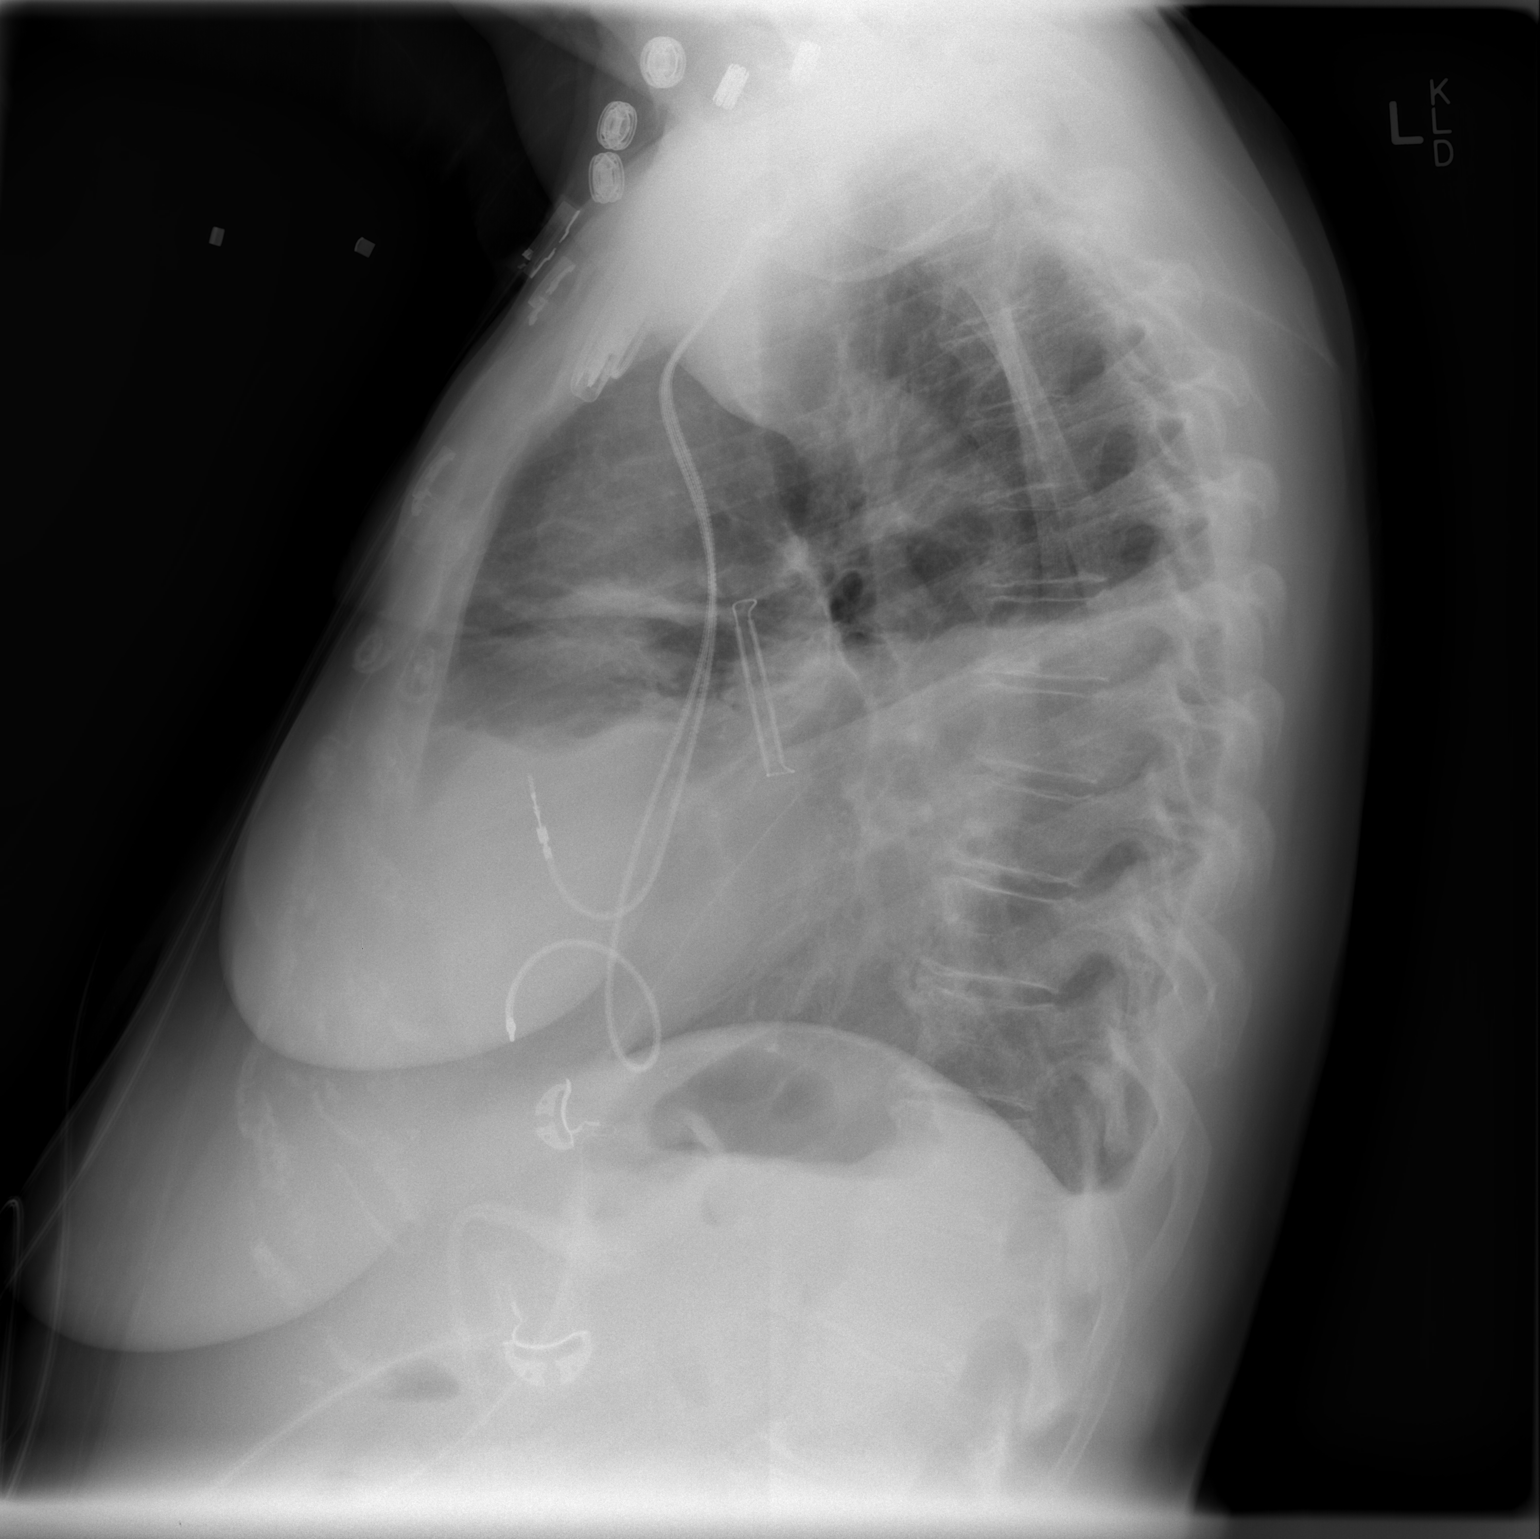

[2 of 2 positions shown; findings below may reference images not displayed]

FINDINGS: Large right pleural effusion, unchanged since yesterday
but increased in size since 04/09/2013.  Associated dense
consolidation in the right middle lobe and right lower lobe.
Linear atelectasis in the inferior right upper lobe.  Left lung
clear.  No left pleural effusion.  Cardiac silhouette normal in
size, unchanged.  Left subclavian dual lead transvenous pacemaker
unchanged in intact.
IMPRESSION: 1.  Large right pleural effusion, unchanged since yesterday but
increased in size since the examination 11 days ago.
2.  Associated dense passive atelectasis and/or pneumonia in the
right middle lobe and right lower lobe.  Mild linear atelectasis in
the right upper lobe.
3.  Left lung remains clear.

## 2014-11-21 NOTE — Progress Notes (Signed)
Pt was started on Eliquis for Atrial Fibrillation on 11/02/2014.  She had previously tried warfarin and Xarelto.  Xarelto was stopped due to nosebleeds.  Pt would like to switch from warfarin to try to avoid need for Lovenox bridge for upcoming procedures.  She does report some hematuria with Eliquis when first starting but this has resolved since stopping Celebrex. She was seen by her PCP on 2/23 and he suggested follow up with urology.  Reviewed patients medication list.  Pt is not currently on any combined P-gp and strong CYP3A4 inhibitors/inducers (ketoconazole, traconazole, ritonavir, carbamazepine, phenytoin, rifampin, St. John's wort).  Reviewed labs.  SCr 1.35, Weight 110 kg   Dose appropriate based on critera.  Hgb and HCT Within Normal Limits  A full discussion of the nature of anticoagulants has been carried out.  A benefit/risk analysis has been presented to the patient, so that they understand the justification for choosing anticoagulation with Eliquis at this time.  The need for compliance is stressed.  Pt is aware to take the medication twice daily.  Side effects of potential bleeding are discussed, including unusual colored urine or stools, coughing up blood or coffee ground emesis, nose bleeds or serious fall or head trauma.  Discussed signs and symptoms of stroke. The patient should avoid any OTC items containing aspirin or ibuprofen.  Avoid alcohol consumption.   Call if any signs of abnormal bleeding.  Discussed financial obligations and resolved any difficulty in obtaining medication.  Next lab test test in 6 months.

## 2014-11-22 ENCOUNTER — Telehealth: Payer: Self-pay

## 2014-11-22 NOTE — Telephone Encounter (Signed)
-----   Message from Inda Castle, MD sent at 11/22/2014 10:18 AM EST ----- Then we can continue eliquis ----- Message -----    From: Virgina Evener, LPN    Sent: 6/0/7371  10:12 AM      To: Inda Castle, MD  She has been put on Eliquis. Her follow up on that is 11/29/14. Her colonoscopy is 12/07/14. ----- Message -----    From: Inda Castle, MD    Sent: 11/22/2014   9:58 AM      To: Virgina Evener, LPN  Beth, Per Dr. Olin Pia recommendation, please schedule her for colonoscopy while continuing Coumadin.  Explain to her that we feel this is the safest course. ----- Message -----    From: Deboraha Sprang, MD    Sent: 11/21/2014   0:62 PM      To: Clearnce Hasten, MD  Rub, if you can do her screening without requiring her to discontinue her anticoagulation he will be safer for her. Hopefully this helps call if any questions [phone]

## 2014-11-22 NOTE — Telephone Encounter (Signed)
-----   Message from Inda Castle, MD sent at 11/22/2014 10:18 AM EST ----- Then we can continue eliquis ----- Message -----    From: Virgina Evener, LPN    Sent: 10/01/5907  10:12 AM      To: Inda Castle, MD  She has been put on Eliquis. Her follow up on that is 11/29/14. Her colonoscopy is 12/07/14. ----- Message -----    From: Inda Castle, MD    Sent: 11/22/2014   9:58 AM      To: Virgina Evener, LPN  Beth, Per Dr. Olin Pia recommendation, please schedule her for colonoscopy while continuing Coumadin.  Explain to her that we feel this is the safest course. ----- Message -----    From: Deboraha Sprang, MD    Sent: 11/21/2014   3:11 PM      To: Clearnce Hasten, MD  Rub, if you can do her screening without requiring her to discontinue her anticoagulation he will be safer for her. Hopefully this helps call if any questions [phone]

## 2014-11-23 ENCOUNTER — Encounter: Payer: Self-pay | Admitting: Pharmacist

## 2014-11-23 IMAGING — CR DG CHEST 1V
1 series · 1 of 1 positions shown · non-contrast
Comparison: Chest radiograph 04/20/2013.

CLINICAL DATA: Recent right-sided thoracentesis.  Coughing.

CHEST - 1 VIEW

[w chest pa]
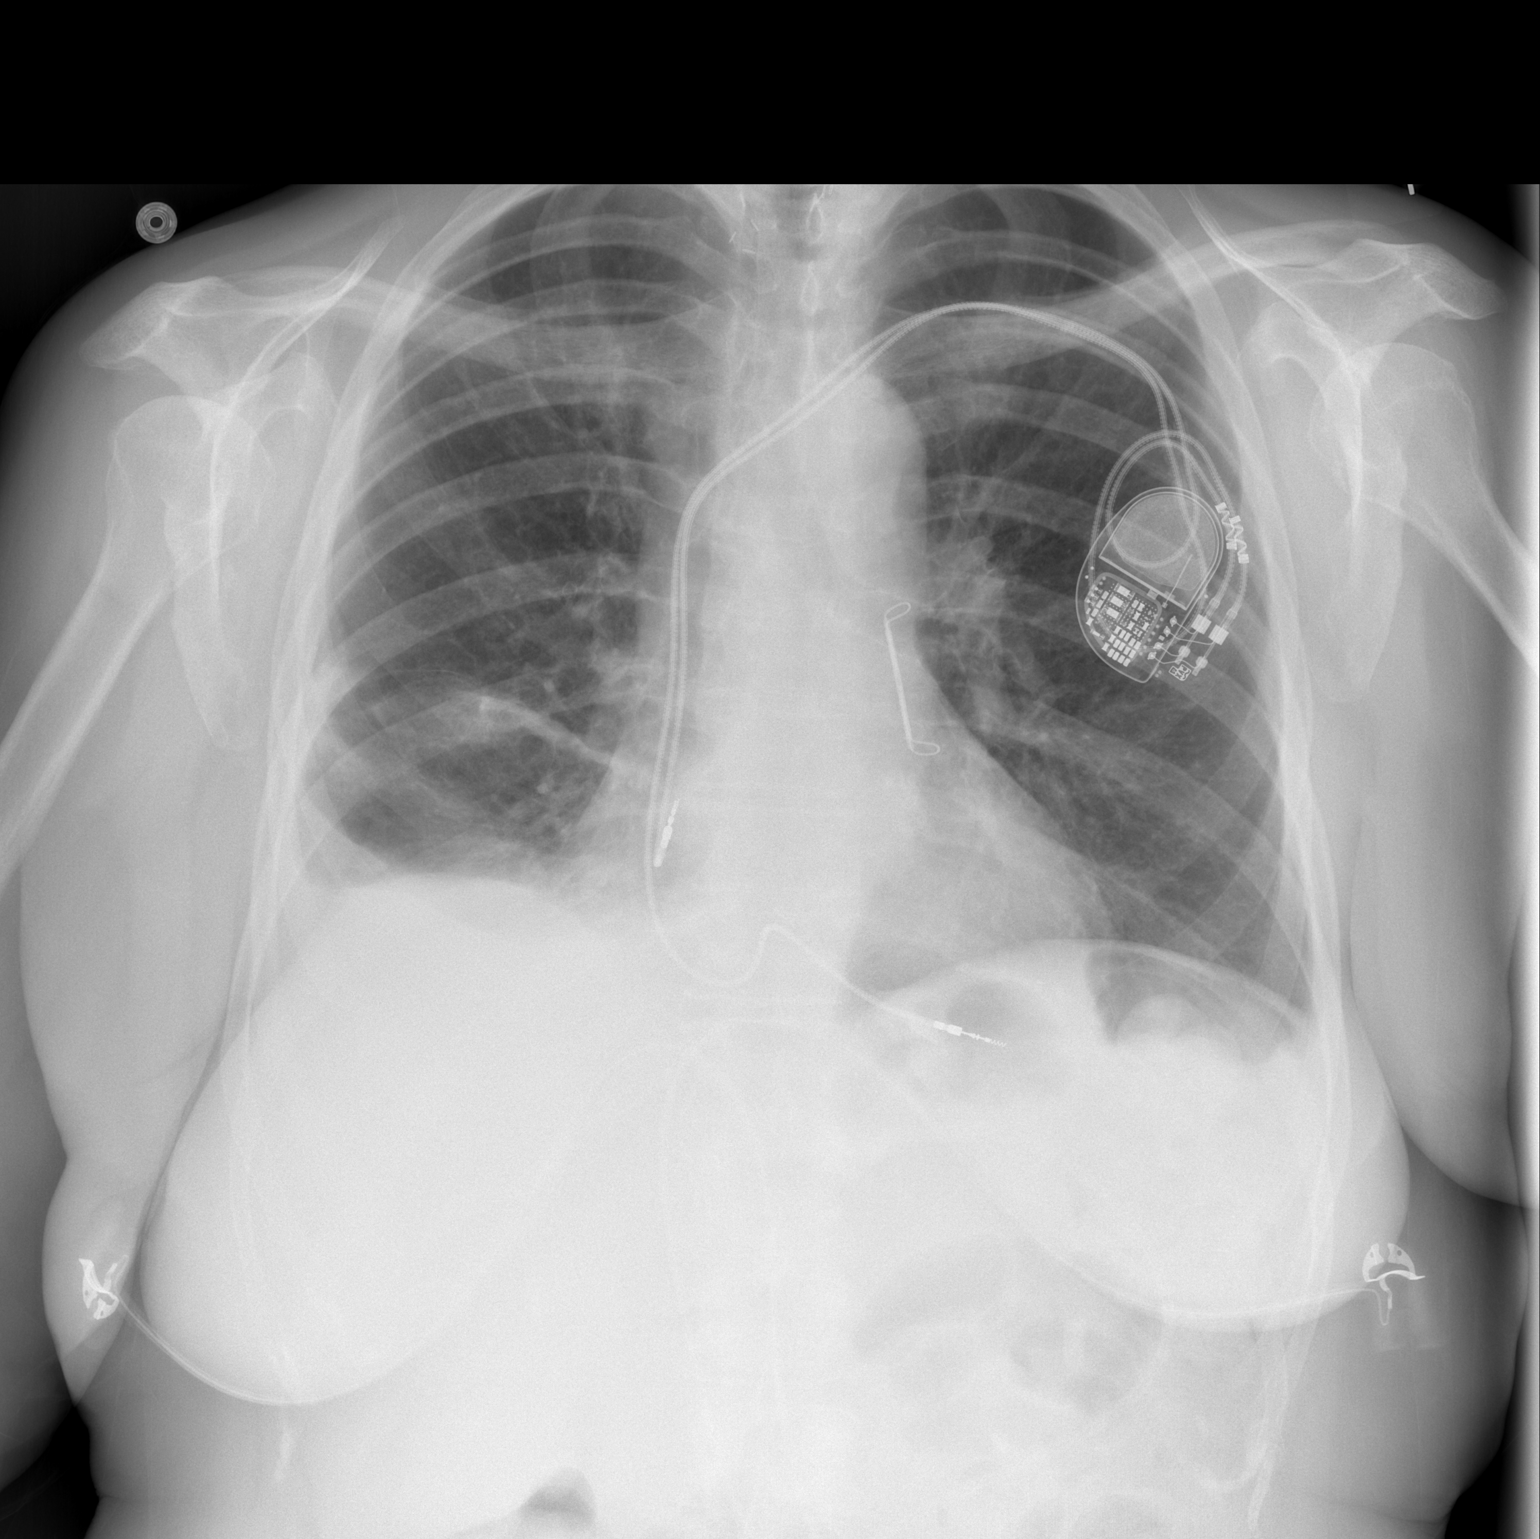

[1 of 1 positions shown; findings below may reference images not displayed]

FINDINGS: Dual lead pacer apparatus overlies the left hemi-thorax,
leads are stable in position.  Stable cardiac and mediastinal
contours.  Interval decrease in size of now small right pleural
effusion with minimal linear opacities in the right lung base
likely representing atelectasis.  No additional consolidative
pulmonary opacities.  No definite pneumothorax.  Visualized bony
skeleton is unremarkable.
IMPRESSION: Interval decrease in size of now small right pleural effusion with
minimal right basilar atelectasis status post right thoracentesis.
No definite right-sided pneumothorax.

## 2014-11-24 IMAGING — CR DG CHEST 1V PORT
1 series · 1 of 1 positions shown · non-contrast
Comparison: 04/22/2013

CLINICAL DATA: Follow up effusion

PORTABLE CHEST - 1 VIEW

[AP]
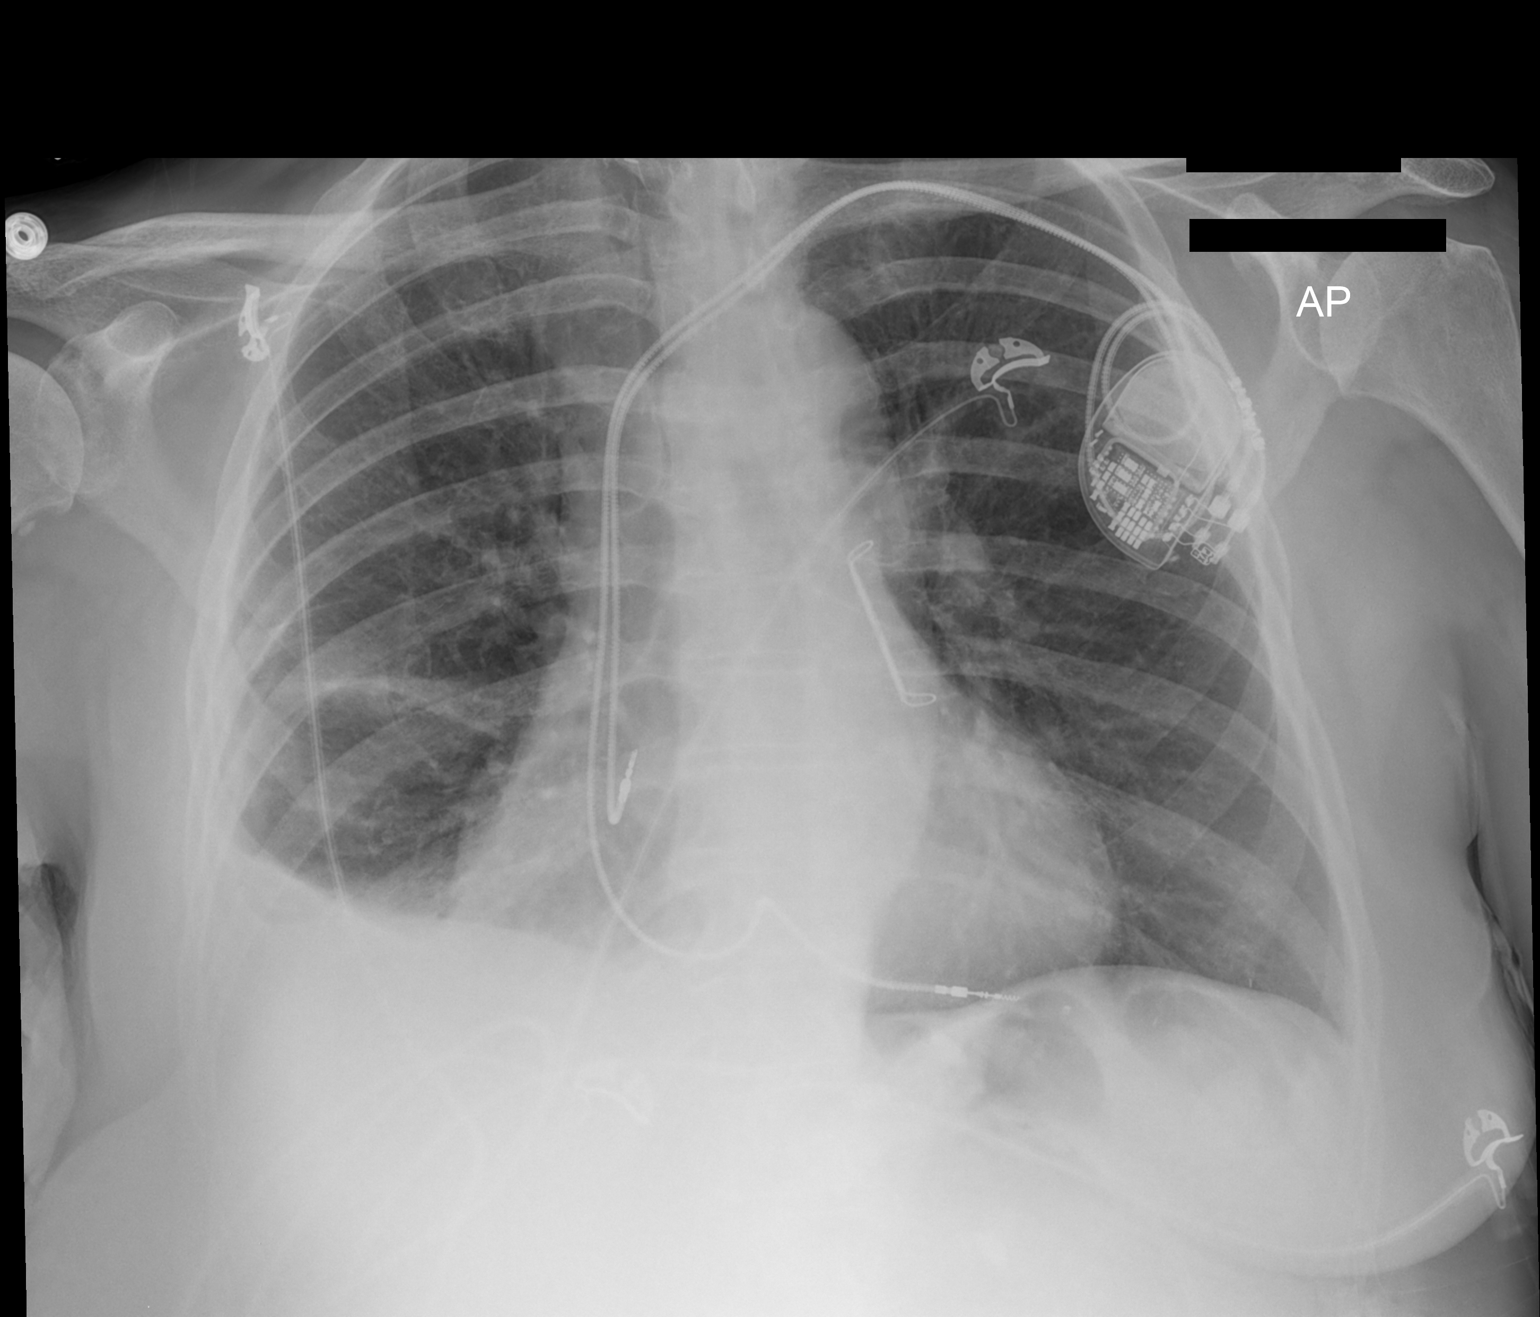

[1 of 1 positions shown; findings below may reference images not displayed]

FINDINGS: Small right pleural effusion, increased in size.  Mild
right lower lobe atelectasis unchanged.  Left lung is clear.  No
heart failure.
IMPRESSION: Mild increase in right pleural effusion.  Negative for edema

## 2014-11-27 IMAGING — CR DG CHEST 2V
2 series · 2 of 2 positions shown · non-contrast
Comparison: April 23, 2013

CLINICAL DATA: Pleural effusion, cough, shortness of breath

CHEST - 2 VIEW

[view not recorded (1 of 2)]
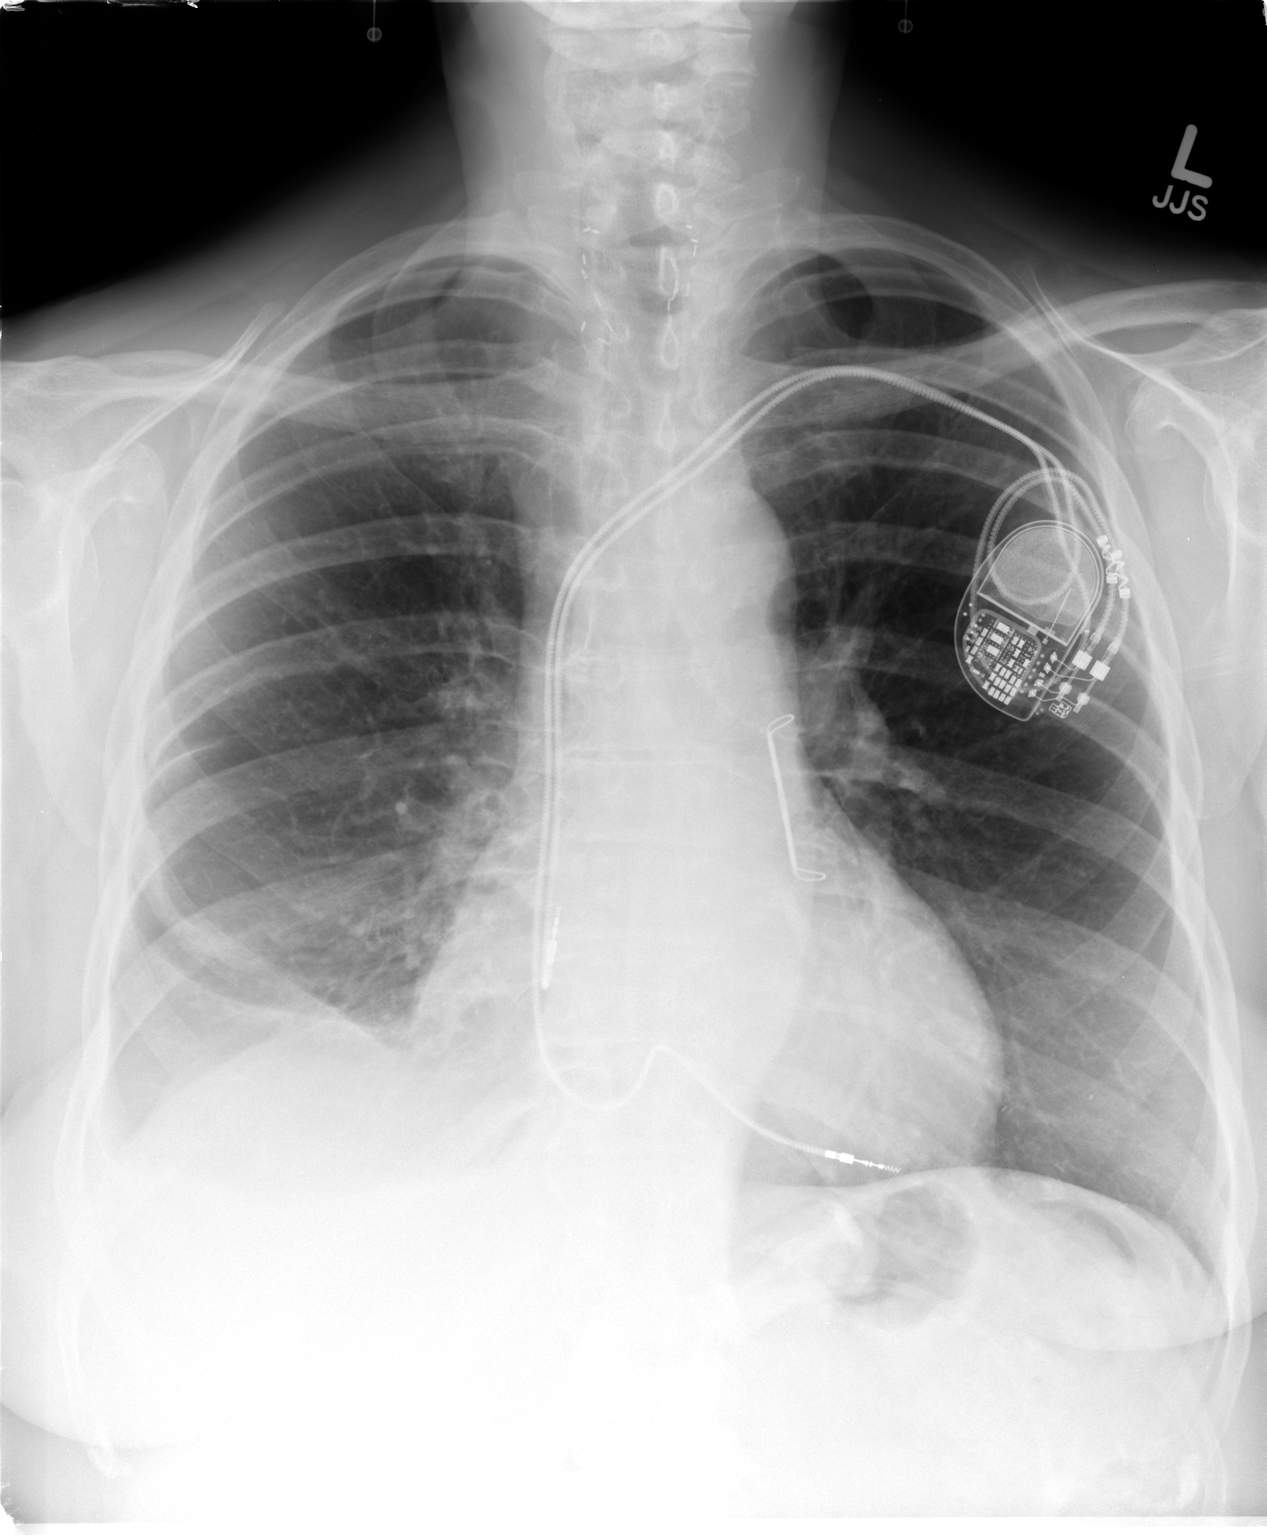

[view not recorded (2 of 2)]
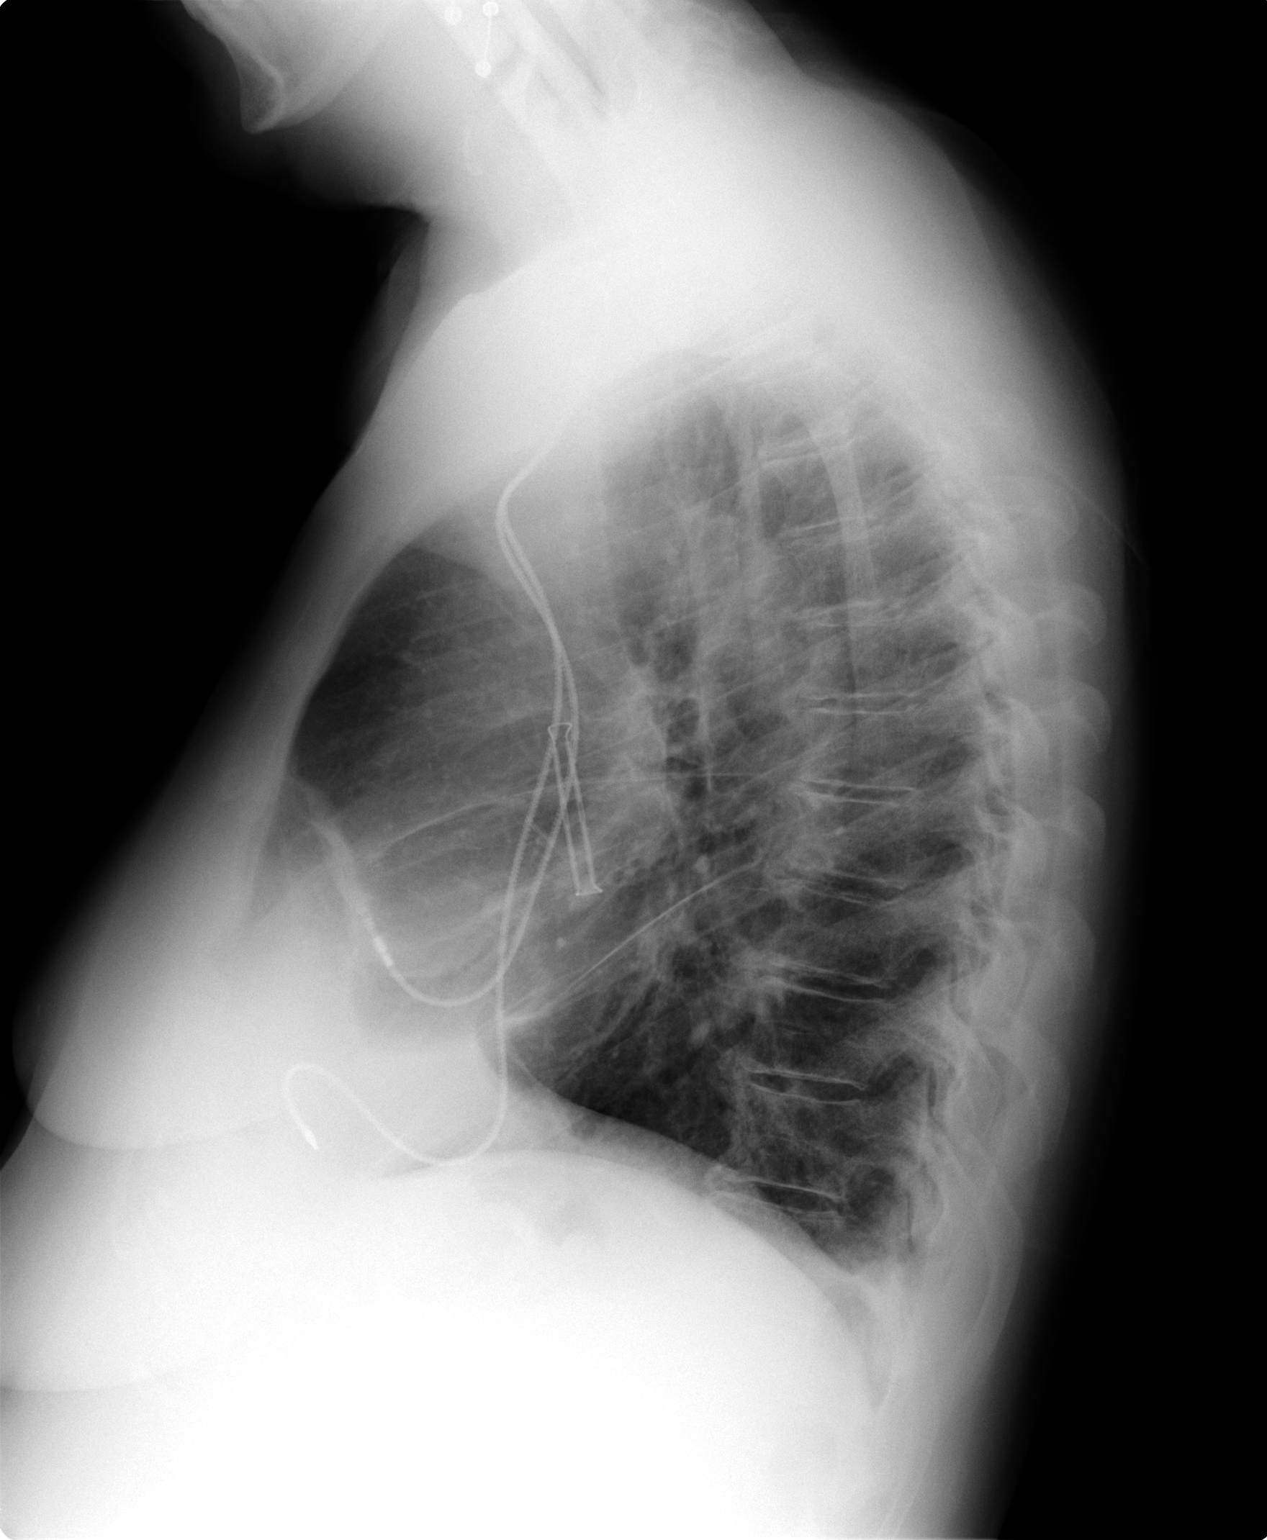

[2 of 2 positions shown; findings below may reference images not displayed]

FINDINGS: There is a small right pleural effusion with
consolidation of the right lung base improved compared prior exam.
The left lung is clear.  Dual lead cardiac pacemaker is identified
unchanged.  The mediastinal contour and cardiac silhouette are
unchanged.  The soft tissues and osseous structures are unchanged.
IMPRESSION: Small right pleural effusion with consolidation of right lung base
improved compared prior exam.

## 2014-11-29 ENCOUNTER — Encounter: Payer: Medicare Other | Admitting: Gastroenterology

## 2014-11-29 DIAGNOSIS — M7072 Other bursitis of hip, left hip: Secondary | ICD-10-CM | POA: Diagnosis not present

## 2014-11-29 DIAGNOSIS — M25552 Pain in left hip: Secondary | ICD-10-CM | POA: Diagnosis not present

## 2014-11-30 IMAGING — CR DG CHEST 2V
2 series · 2 of 2 positions shown · non-contrast
Comparison: 04/26/2013

CLINICAL DATA: Follow up pleural effusion, cough

CHEST - 2 VIEW

[view not recorded (1 of 2)]
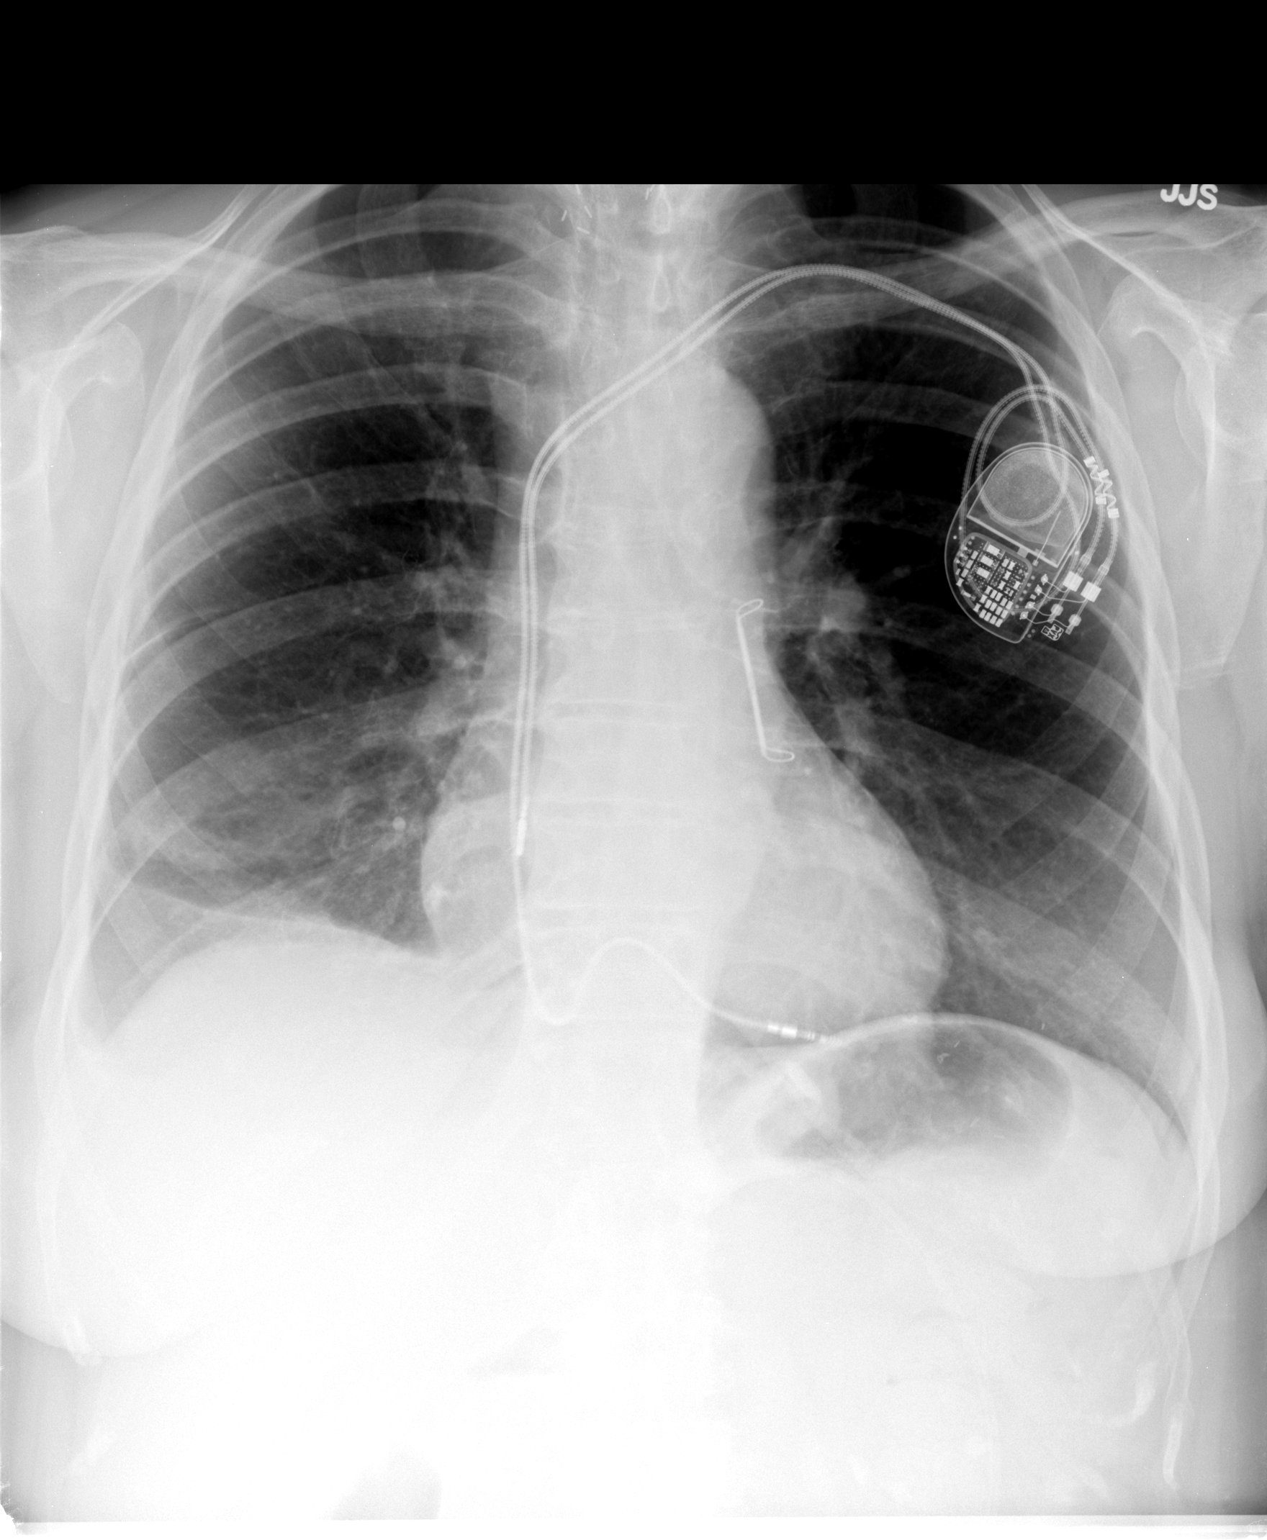

[view not recorded (2 of 2)]
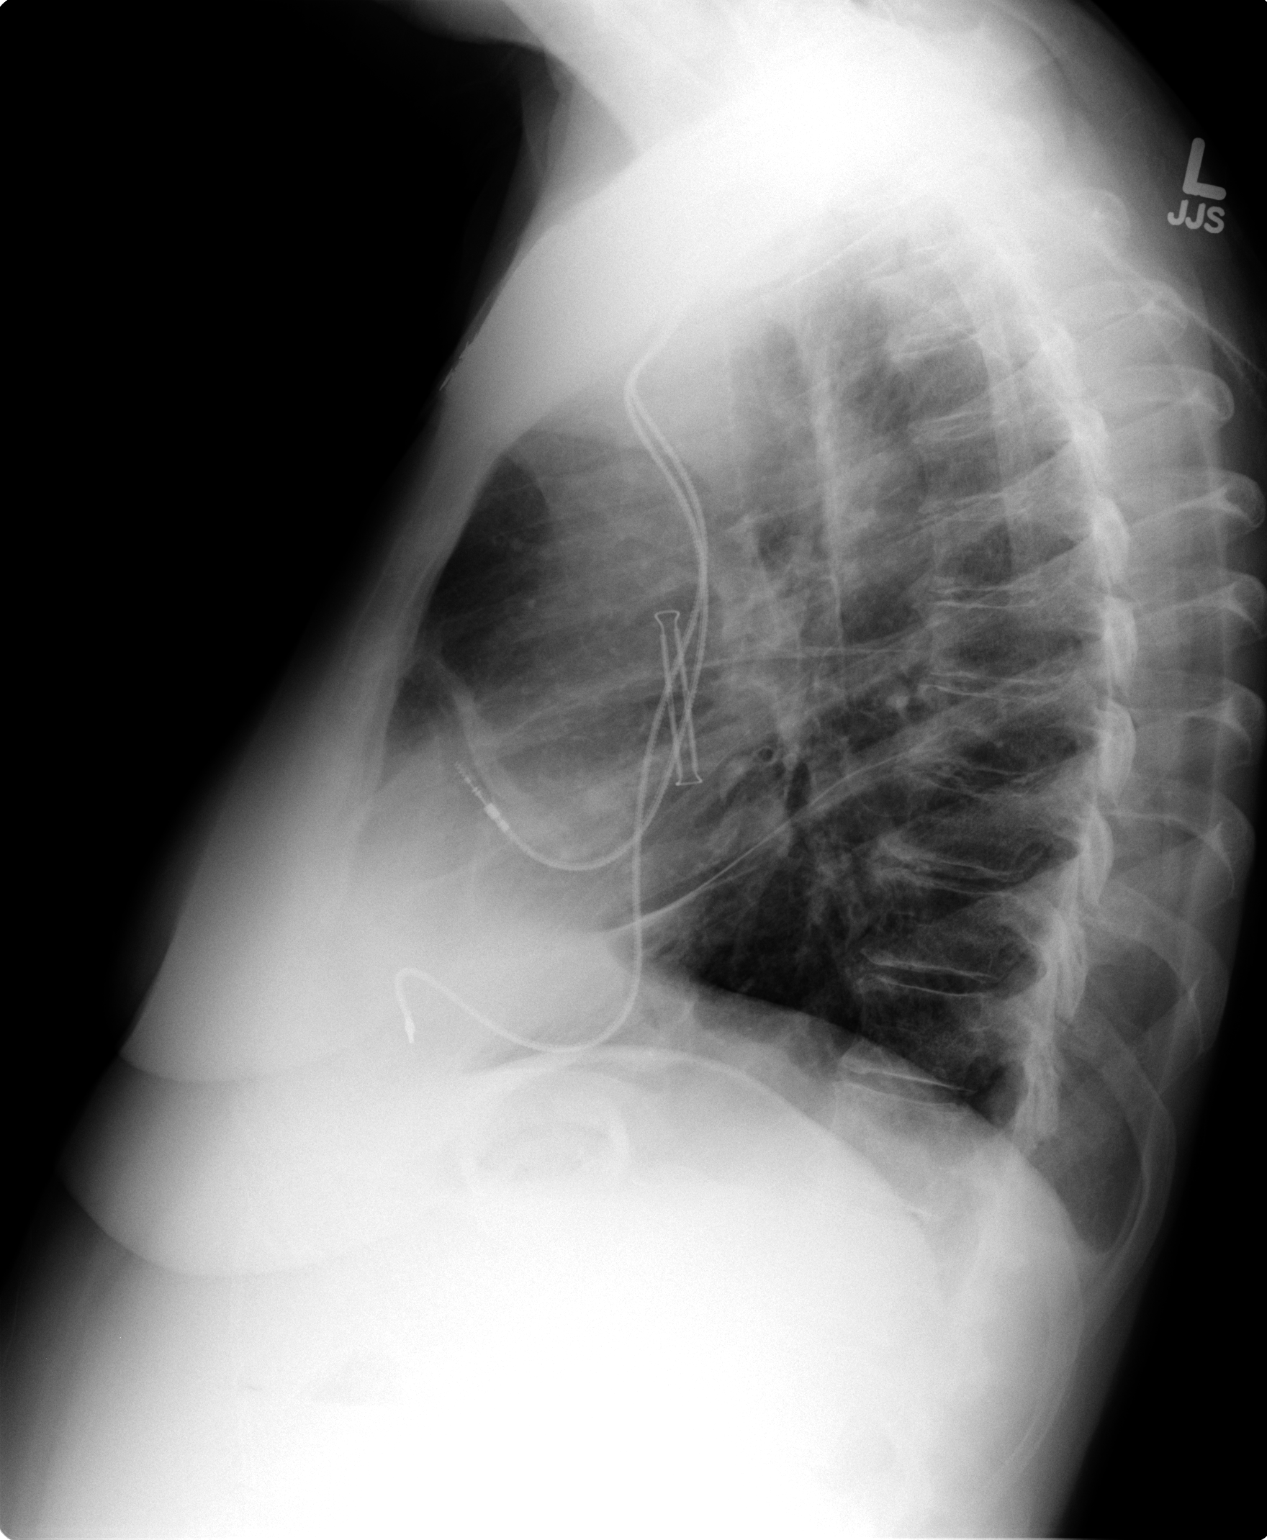

[2 of 2 positions shown; findings below may reference images not displayed]

FINDINGS: Cardiomediastinal silhouette is stable.  Dual lead
cardiac pacemaker is unchanged in position.  Persistent small
partially loculated right pleural effusion with right basilar
anterior atelectasis or infiltrate.  Mild degenerative changes
thoracic spine.  No pulmonary edema.
IMPRESSION: Persistent small partially loculated right pleural effusion with
right basilar anterior atelectasis or infiltrate.  Mild
degenerative changes thoracic spine.  No pulmonary edema.]

## 2014-12-01 ENCOUNTER — Encounter: Payer: Self-pay | Admitting: *Deleted

## 2014-12-06 DIAGNOSIS — N200 Calculus of kidney: Secondary | ICD-10-CM | POA: Diagnosis not present

## 2014-12-06 DIAGNOSIS — R310 Gross hematuria: Secondary | ICD-10-CM | POA: Diagnosis not present

## 2014-12-07 ENCOUNTER — Encounter: Payer: Self-pay | Admitting: Gastroenterology

## 2014-12-07 ENCOUNTER — Ambulatory Visit (AMBULATORY_SURGERY_CENTER): Payer: Medicare Other | Admitting: Gastroenterology

## 2014-12-07 VITALS — BP 139/71 | HR 62 | Temp 98.7°F | Resp 14 | Ht 66.0 in | Wt 243.0 lb

## 2014-12-07 DIAGNOSIS — K30 Functional dyspepsia: Secondary | ICD-10-CM | POA: Diagnosis not present

## 2014-12-07 DIAGNOSIS — Z8673 Personal history of transient ischemic attack (TIA), and cerebral infarction without residual deficits: Secondary | ICD-10-CM | POA: Diagnosis not present

## 2014-12-07 DIAGNOSIS — K5730 Diverticulosis of large intestine without perforation or abscess without bleeding: Secondary | ICD-10-CM

## 2014-12-07 DIAGNOSIS — Z86010 Personal history of colonic polyps: Secondary | ICD-10-CM

## 2014-12-07 DIAGNOSIS — E039 Hypothyroidism, unspecified: Secondary | ICD-10-CM | POA: Diagnosis not present

## 2014-12-07 DIAGNOSIS — K635 Polyp of colon: Secondary | ICD-10-CM | POA: Diagnosis not present

## 2014-12-07 DIAGNOSIS — I4891 Unspecified atrial fibrillation: Secondary | ICD-10-CM | POA: Diagnosis not present

## 2014-12-07 DIAGNOSIS — K648 Other hemorrhoids: Secondary | ICD-10-CM

## 2014-12-07 MED ORDER — SODIUM CHLORIDE 0.9 % IV SOLN: 500.0000 mL | INTRAVENOUS | Status: DC

## 2014-12-07 NOTE — Progress Notes (Signed)
Pt awake and alert with VSS,  Pleased with MAC report to rn

## 2014-12-07 NOTE — Patient Instructions (Signed)
YOU HAD AN ENDOSCOPIC PROCEDURE TODAY AT [facility]:   Refer to the procedure report that was given to you for any specific questions about what was found during the examination.  If the procedure report does not answer your questions, please call your gastroenterologist to clarify.  If you requested that your care partner not be given the details of your procedure findings, then the procedure report has been included in a sealed envelope for you to review at your convenience later.  YOU SHOULD EXPECT: Some feelings of bloating in the abdomen. Passage of more gas than usual.  Walking can help get rid of the air that was put into your GI tract during the procedure and reduce the bloating. If you had a lower endoscopy (such as a colonoscopy or flexible sigmoidoscopy) you may notice spotting of blood in your stool or on the toilet paper. If you underwent a bowel prep for your procedure, you may not have a normal bowel movement for a few days.  Please Note:  You might notice some irritation and congestion in your nose or some drainage.  This is from the oxygen used during your procedure.  There is no need for concern and it should clear up in a day or so.  SYMPTOMS TO REPORT IMMEDIATELY:   Following lower endoscopy (colonoscopy or flexible sigmoidoscopy):  Excessive amounts of blood in the stool  Significant tenderness or worsening of abdominal pains  Swelling of the abdomen that is new, acute  Fever of 100F or higher  For urgent or emergent issues, a gastroenterologist can be reached at any hour by calling [phone].   DIET: Your first meal following the procedure should be a small meal and then it is ok to progress to your normal diet. Heavy or fried foods are harder to digest and may make you feel nauseous or bloated.  Likewise, meals heavy in dairy and vegetables can increase bloating.  Drink plenty of fluids but you should avoid alcoholic beverages for 24  hours.  ACTIVITY:  You should plan to take it easy for the rest of today and you should NOT DRIVE or use heavy machinery until tomorrow (because of the sedation medicines used during the test).    FOLLOW UP: Our staff will call the number listed on your records the next business day following your procedure to check on you and address any questions or concerns that you may have regarding the information given to you following your procedure. If we do not reach you, we will leave a message.  However, if you are feeling well and you are not experiencing any problems, there is no need to return our call.  We will assume that you have returned to your regular daily activities without incident.  If any biopsies were taken you will be contacted by phone or by letter within the next 1-3 weeks.  Please call us at [phone] if you have not heard about the biopsies in 3 weeks.    SIGNATURES/CONFIDENTIALITY: You and/or your care partner have signed paperwork which will be entered into your electronic medical record.  These signatures attest to the fact that that the information above on your After Visit Summary has been reviewed and is understood.  Full responsibility of the confidentiality of this discharge information lies with you and/or your care-partner.  Recommendations Discharge instructions given to patient and/or care partner. Diverticulosis and high fiber diet handouts provided.

## 2014-12-07 NOTE — Op Note (Signed)
[facility]  [address] [place], [address]   COLONOSCOPY PROCEDURE REPORT  PATIENT: Kayla, Harrison  MR#: [id number] BIRTHDATE: 1946-02-01 , 68  yrs. old GENDER: female ENDOSCOPIST: Inda Castle, MD REFERRED BY: PROCEDURE DATE:  12/07/2014 PROCEDURE:   Colonoscopy, surveillance First Screening Colonoscopy - Avg.  risk and is 50 yrs.  old or older - No.  Prior Negative Screening - Now for repeat screening. N/A  History of Adenoma - Now for follow-up colonoscopy & has been > or = to 3 yrs.  Yes hx of adenoma.  Has been 3 or more years since last colonoscopy. ASA CLASS:   Class II INDICATIONS:PH Colon Adenoma. 2010 MEDICATIONS: Monitored anesthesia care and Propofol 200 mg IV  DESCRIPTION OF PROCEDURE:   After the risks benefits and alternatives of the procedure were thoroughly explained, informed consent was obtained.  The digital rectal exam revealed no abnormalities of the rectum.   The [id number]  endoscope was introduced through the anus and advanced to the terminal ileum which was intubated for a short distance. No adverse events experienced.   The quality of the prep was (Suprep was used) excellent.  The instrument was then slowly withdrawn as the colon was fully examined.      COLON FINDINGS: There was moderate diverticulosis noted in the sigmoid colon and descending colon.   Internal hemorrhoids were found.   The examination was otherwise normal.  Retroflexed views revealed no abnormalities. The time to cecum = 2.7 Withdrawal time = 6.2   The scope was withdrawn and the procedure completed. COMPLICATIONS: There were no immediate complications.  ENDOSCOPIC IMPRESSION: 1.   Moderate diverticulosis was noted in the sigmoid colon and descending colon 2.   Internal hemorrhoids 3.   The examination was otherwise normal  RECOMMENDATIONS: Given your age, you will not need another colonoscopy for colon cancer screening or polyp  surveillance.  These types of tests usually stop around the age 58.  eSigned:  Inda Castle, MD 12/07/2014 2:48 PM   cc: Unice Cobble, MD, [name], MD   PATIENT NAME:  Kayla, Harrison MR#: [id number]

## 2014-12-08 ENCOUNTER — Encounter: Payer: Self-pay | Admitting: Internal Medicine

## 2014-12-08 ENCOUNTER — Telehealth: Payer: Self-pay

## 2014-12-08 NOTE — Telephone Encounter (Signed)
  Follow up Call-  Call back number 12/07/2014  Post procedure Call Back phone  # [phone]  Permission to leave phone message Yes     Patient questions:  Do you have a fever, pain , or abdominal swelling? No. Pain Score  0 *  Have you tolerated food without any problems? Yes.    Have you been able to return to your normal activities? Yes.    Do you have any questions about your discharge instructions: Diet   No. Medications  No. Follow up visit  No.  Do you have questions or concerns about your Care? No.  Actions: * If pain score is 4 or above: No action needed, pain <4.

## 2014-12-13 IMAGING — CR DG CHEST 2V
2 series · 2 of 2 positions shown · non-contrast
Comparison: 04/29/2013 and earlier.

CLINICAL DATA: 66-year-old female shortness of breath. Pleural
effusion.

EXAM:
CHEST  2 VIEW

[view not recorded (1 of 2)]
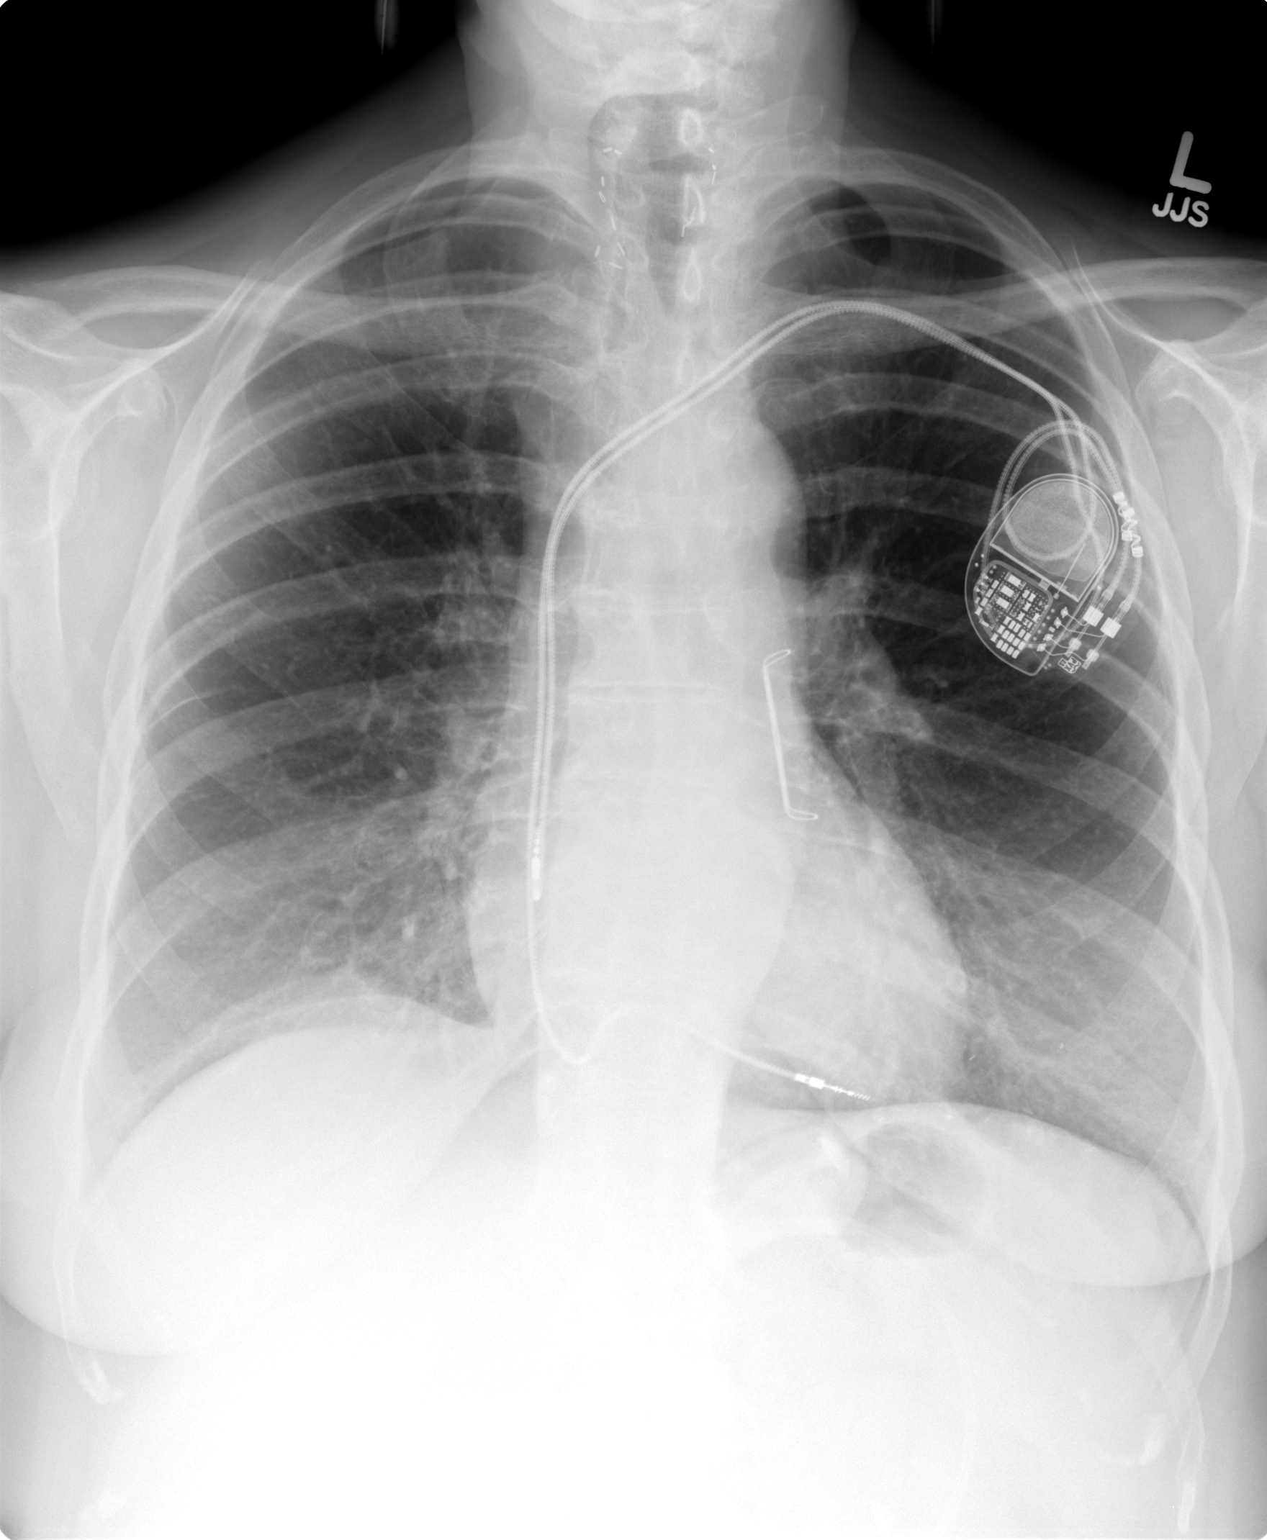

[view not recorded (2 of 2)]
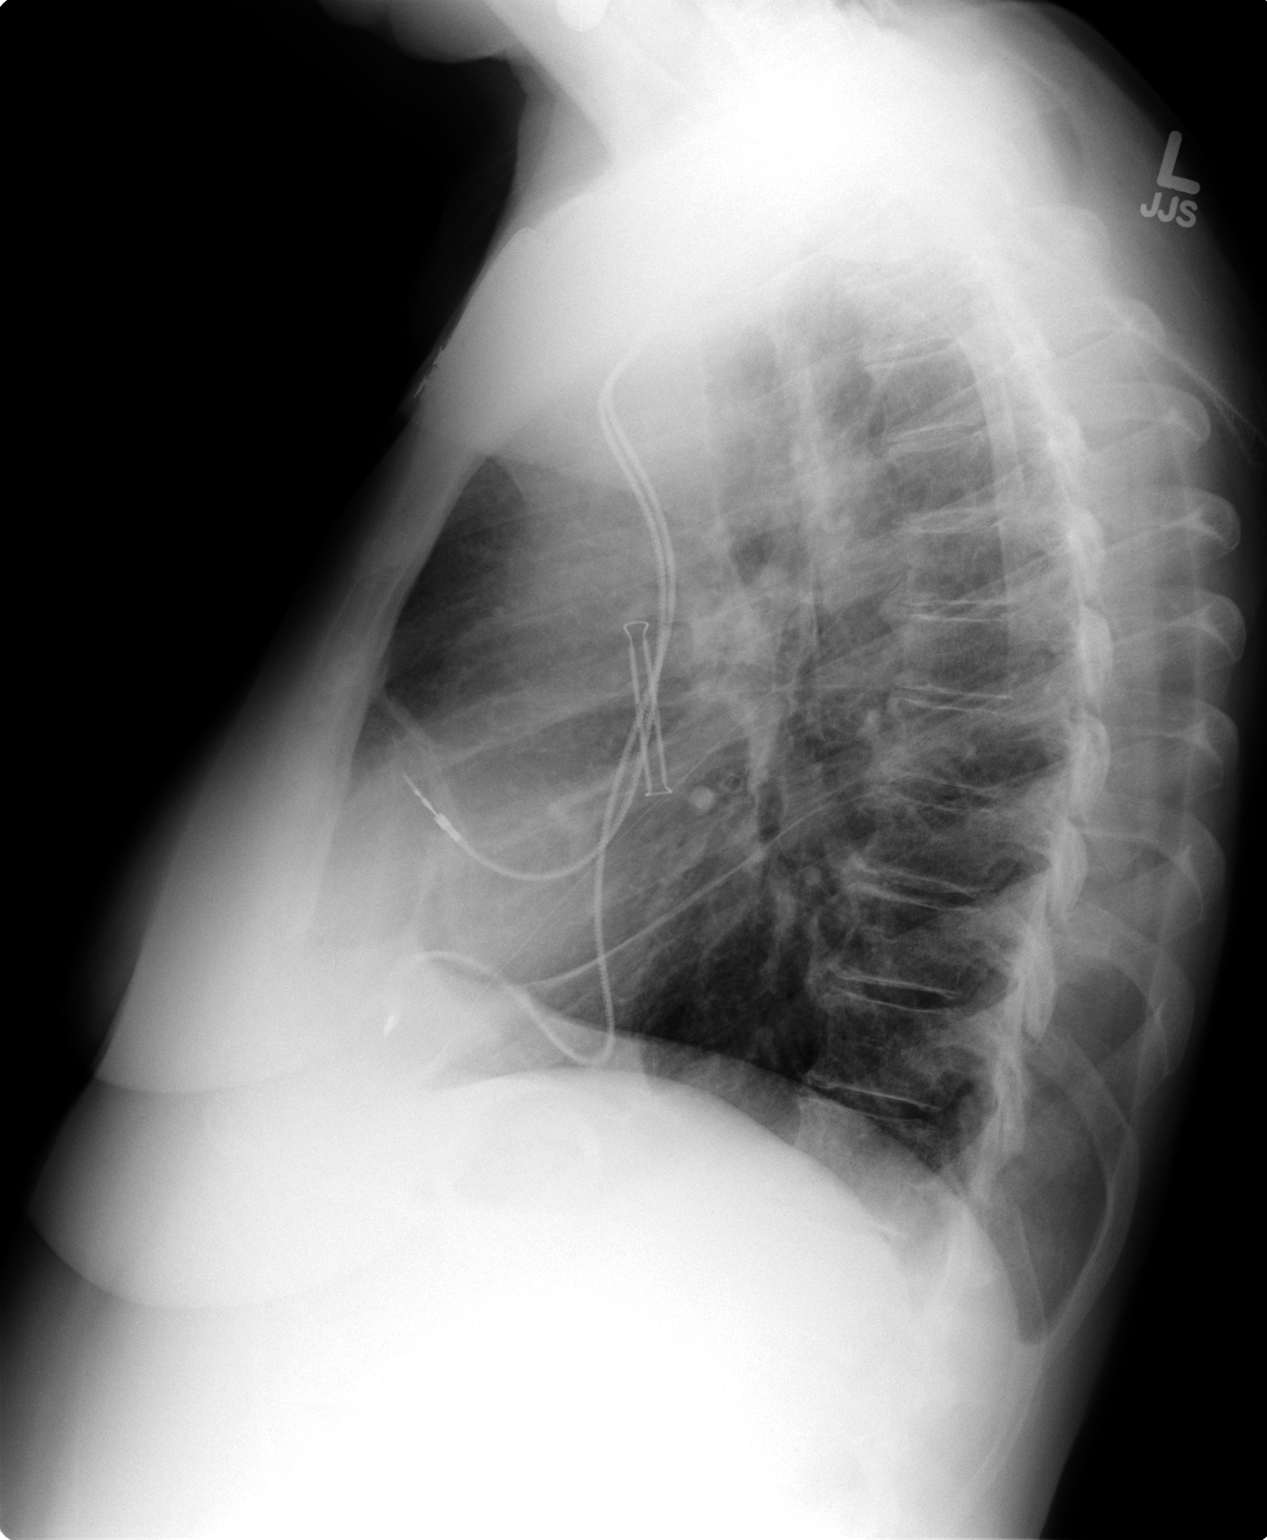

[2 of 2 positions shown; findings below may reference images not displayed]

FINDINGS: Stable lung volumes. Stable mediastinal contours. Stable left chest
cardiac pacemaker. No pneumothorax or edema. The left lung remains
clear. Small right pleural effusion is decreased, trace residual.
Stable surgical clips at the thoracic inlet. No acute osseous
abnormality identified.
IMPRESSION: Decreased small right pleural effusion, trace residual. No new
cardiopulmonary abnormality.

## 2014-12-14 ENCOUNTER — Other Ambulatory Visit: Payer: Self-pay | Admitting: Internal Medicine

## 2014-12-15 DIAGNOSIS — I7090 Unspecified atherosclerosis: Secondary | ICD-10-CM | POA: Diagnosis not present

## 2014-12-15 DIAGNOSIS — R310 Gross hematuria: Secondary | ICD-10-CM | POA: Diagnosis not present

## 2014-12-15 DIAGNOSIS — N200 Calculus of kidney: Secondary | ICD-10-CM | POA: Diagnosis not present

## 2014-12-15 DIAGNOSIS — N201 Calculus of ureter: Secondary | ICD-10-CM | POA: Diagnosis not present

## 2014-12-28 ENCOUNTER — Encounter: Payer: Self-pay | Admitting: Pharmacist

## 2015-01-05 ENCOUNTER — Other Ambulatory Visit: Payer: Self-pay | Admitting: Internal Medicine

## 2015-01-09 ENCOUNTER — Other Ambulatory Visit: Payer: Self-pay | Admitting: Internal Medicine

## 2015-01-09 ENCOUNTER — Encounter: Payer: Self-pay | Admitting: Internal Medicine

## 2015-01-10 DIAGNOSIS — N201 Calculus of ureter: Secondary | ICD-10-CM | POA: Diagnosis not present

## 2015-01-11 ENCOUNTER — Ambulatory Visit (INDEPENDENT_AMBULATORY_CARE_PROVIDER_SITE_OTHER): Payer: Medicare Other | Admitting: *Deleted

## 2015-01-11 DIAGNOSIS — I6350 Cerebral infarction due to unspecified occlusion or stenosis of unspecified cerebral artery: Secondary | ICD-10-CM

## 2015-01-11 DIAGNOSIS — Z5181 Encounter for therapeutic drug level monitoring: Secondary | ICD-10-CM | POA: Diagnosis not present

## 2015-01-11 DIAGNOSIS — Z7901 Long term (current) use of anticoagulants: Secondary | ICD-10-CM | POA: Diagnosis not present

## 2015-01-11 DIAGNOSIS — I639 Cerebral infarction, unspecified: Secondary | ICD-10-CM | POA: Diagnosis not present

## 2015-01-11 DIAGNOSIS — I4891 Unspecified atrial fibrillation: Secondary | ICD-10-CM | POA: Diagnosis not present

## 2015-01-11 LAB — POCT INR: INR: 1.5

## 2015-01-11 NOTE — Patient Instructions (Signed)

## 2015-01-12 ENCOUNTER — Ambulatory Visit (INDEPENDENT_AMBULATORY_CARE_PROVIDER_SITE_OTHER): Payer: Medicare Other | Admitting: Internal Medicine

## 2015-01-12 ENCOUNTER — Encounter: Payer: Self-pay | Admitting: Internal Medicine

## 2015-01-12 VITALS — BP 158/80 | HR 70 | Ht 66.0 in | Wt 237.6 lb

## 2015-01-12 DIAGNOSIS — Z45018 Encounter for adjustment and management of other part of cardiac pacemaker: Secondary | ICD-10-CM

## 2015-01-12 DIAGNOSIS — I639 Cerebral infarction, unspecified: Secondary | ICD-10-CM

## 2015-01-12 DIAGNOSIS — I480 Paroxysmal atrial fibrillation: Secondary | ICD-10-CM

## 2015-01-12 DIAGNOSIS — I495 Sick sinus syndrome: Secondary | ICD-10-CM | POA: Diagnosis not present

## 2015-01-12 LAB — MDC_IDC_ENUM_SESS_TYPE_INCLINIC
Brady Statistic AP VS Percent: 0.05 %
Brady Statistic AS VP Percent: 0.04 %
Brady Statistic AS VS Percent: 99.91 %
Brady Statistic RA Percent Paced: 0.05 %
Brady Statistic RV Percent Paced: 0.04 %
Lead Channel Impedance Value: 342 Ohm
Lead Channel Impedance Value: 380 Ohm
Lead Channel Impedance Value: 418 Ohm
Lead Channel Pacing Threshold Pulse Width: 0.4 ms
Lead Channel Pacing Threshold Pulse Width: 0.4 ms
Lead Channel Sensing Intrinsic Amplitude: 0.625 mV
Lead Channel Sensing Intrinsic Amplitude: 12.125 mV
Lead Channel Setting Pacing Amplitude: 2 V
Lead Channel Setting Pacing Amplitude: 3.25 V
Lead Channel Setting Pacing Pulse Width: 0.4 ms
Lead Channel Setting Sensing Sensitivity: 0.9 mV
MDC IDC MSMT BATTERY REMAINING LONGEVITY: 106 mo
MDC IDC MSMT BATTERY VOLTAGE: 3.02 V
MDC IDC MSMT LEADCHNL RA IMPEDANCE VALUE: 513 Ohm
MDC IDC MSMT LEADCHNL RA PACING THRESHOLD AMPLITUDE: 1 V
MDC IDC MSMT LEADCHNL RV PACING THRESHOLD AMPLITUDE: 1.75 V
MDC IDC SESS DTM: 20160421165117
MDC IDC STAT BRADY AP VP PERCENT: 0 %
Zone Setting Detection Interval: 350 ms
Zone Setting Detection Interval: 400 ms

## 2015-01-12 NOTE — Patient Instructions (Signed)
Medication Instructions:  Your physician recommends that you continue on your current medications as directed. Please refer to the Current Medication list given to you today.  Labwork: None  Testing/Procedures: None  Follow-Up: Remote monitoring is used to monitor your Pacemaker of ICD from home. This monitoring reduces the number of office visits required to check your device to one time per year. It allows [place] to keep an eye on the functioning of your device to ensure it is working properly. You are scheduled for a device check from home on 04/13/15. You may send your transmission at any time that day. If you have a wireless device, the transmission will be sent automatically. After your physician reviews your transmission, you will receive a postcard with your next transmission date.  Your physician wants you to follow-up in: 1 year with Dr. Caryl Comes.  You will receive a reminder letter in the mail two months in advance. If you don't receive a letter, please call our office to schedule the follow-up appointment.   Any Other Special Instructions Will Be Listed Below (If Applicable).

## 2015-01-12 NOTE — Progress Notes (Signed)
Patient Care Team: Hendricks Limes, MD as PCP - General   HPI  Kayla Harrison is a 69 y.o. female Seen in followup for a pacemaker implanted at [facility]     She has atrial fibrillation for which she underwent pulmonary vein isolation at [facility] 2009 She's had problems with recurrent atrial fibrillation. She underwent convergent ablation at [facility] 1/61; it was complicated by heart block prompting pacemaker implantation.     She has a history of prior strokes and remains on oral anticoagulation . She was not tolerant developed with and is now on warfarin  She is struggling with a kidney stone on the right. There are concerns regarding pacemaker interaction for to prevent lithotripsy  Echo 2011 EF 55% with mod LAE-36m   LV function was normal Echocardiogram post ablation demonstrated normal left ventricular function and without effusion.   She also has a history of morbid obesity status post lap band surgery    Past Medical History  Diagnosis Date  . Morbid obesity     Status post lap band surgery  . Hx of thyroid cancer     Dr SForde Dandy . Colon polyp     Dr MEarlean Shawl . Dyslipidemia   . Hyperlipidemia   . Hypothyroidism   . Breast cancer     Dr SMargot Chimes total thyroidectomy- 1999- for cancer  . [facility]   . Arthritis     osteoarthritis - knees   . Normal cardiac stress test     2008- cardiac stress/echo  . [place]  . Atrial fibrillation     ablation- 2x's-- [facility]  . Complication of anesthesia     Ketamine produces LSD reaction, bright colored nightmarish experience   . Hepatitis     [place]hepatitis   . Sinus node dysfunction     Complicating convergent ablation 6/14  . Pacemaker-Medtronic   . Renal calculi     Past Surgical History  Procedure Laterality Date  . Thyroidectomy   1998    Dr SMargot Chimes . Bso  1998  . Cholecystectomy    . Tonsillectomy    . Colonoscopy w/ polypectomy      Dr MEarlean Shawl . Afib ablation      X 2; [facility] & Dr KCaryl Comes . Abdominal hysterectomy  1983  . Breast lumpectomy  2010  . Laparoscopic gastric banding  07/10/2010  . Appendectomy    . Knee arthroscopy      bilateral  . Laparotomy      for ruptured ovary and ovarian artery   . Total knee arthroplasty  04/13/2012    Procedure: TOTAL KNEE ARTHROPLASTY;  Surgeon: SRudean Haskell MD;  [place]  Service: Orthopedics;  Laterality: Right;  . Joint replacement  04/13/12    Right Knee replacement  . Cardioversion  10/09/2012    Procedure: CARDIOVERSION;  Surgeon: JMinus Breeding MD;  [place]  Service: Cardiovascular;  Laterality: N/A;  . Cardioversion  10/09/2012    Procedure: CARDIOVERSION;  Surgeon: JMinus Breeding MD;  [facility];  Service: Cardiovascular;  Laterality: N/A;  [name] the ok to add pt to the add on , but we must check to find out if the can add pt on  at [phone])  . Cardioversion N/A 11/20/2012    Procedure: CARDIOVERSION;  Surgeon: Fay Records, MD;  Location: [facility] ENDOSCOPY;  Service: Cardiovascular;  Laterality: N/A;  . Transabdominal ablation  03/02/13    [facility]  . Pacemaker insertion  03/10/13    [facility]  . Convergent    . Pocket revision N/A 12/08/2013    Procedure: POCKET REVISION;  Surgeon: Deboraha Sprang, MD;  Location: [facility] CATH LAB;  Service: Cardiovascular;  Laterality: N/A;    Current Outpatient Prescriptions  Medication Sig Dispense Refill  . amiodarone (PACERONE) 200 MG tablet Take 0.5 tablets (100 mg total) by mouth daily. 30 tablet 6  . furosemide (LASIX) 40 MG tablet TAKE 1 TABLET BY MOUTH EVERY DAY AS DIRECTED 30 tablet 1  . levothyroxine (SYNTHROID, LEVOTHROID) 175 MCG tablet Take 175 mcg by mouth daily before breakfast. TAKE 1 TABLET BY MOUTH EVERY DAY EXCEPT ON SAT.    . Multiple Vitamin (MULTIVITAMIN) tablet Take 2 tablets  by mouth daily.     . pravastatin (PRAVACHOL) 40 MG tablet TAKE 1 TABLET (40 MG TOTAL) BY MOUTH DAILY. 90 tablet 1  . TAURINE PO Take 1 tablet by mouth daily.     Marland Kitchen warfarin (COUMADIN) 5 MG tablet Take as directed by coumadin  clinic     No current facility-administered medications for this visit.    Allergies  Allergen Reactions  . Tikosyn [Dofetilide]     Prolonged QT interval Prolonged QT interval  . Xarelto [Rivaroxaban]     Nose Bleed X 6 hrs ; packing in ER  . Epinephrine Other (See Comments) and Rash    Dental form only (liquid). Patient stated she will become "out of it" she can hear you but cannot respond in normal fashion.  Marland Kitchen Ketamine     Hallucinations    Review of Systems negative except from HPI and PMH  Physical Exam BP 158/80 mmHg  Pulse 70  Ht '5\' 6"'$  (1.676 m)  Wt 237 lb 9.6 oz (107.775 kg)  BMI 38.37 kg/m2 Well developed and well nourished in no acute distress HENT normal E scleral and icterus clear Neck Supple JVP flat; carotids brisk and full Clear to ausculation  Regular rate and rhythm, no murmurs gallops or rub Soft with active bowel sounds No clubbing cyanosis   Edema Alert and oriented, grossly normal motor and sensory function Skin Warm and Dry  ECG was ordered today and demonstrated sinus rhythm at 70 Intervals 18/10/44+ RSR prime  Assessment and  Plan  Atrial fibrillation  S/p convergent ablation  Prior Strokes  Kidney Stones  Pacemaker  NOAC intolerance  Doing well following device revision. There is infrequent atrial fibrillation. There is improved exercise activity. Now mostly limited by leg pain  She has need for surgery for removal of a renal stone   We should consider lithotripsy  the second she doesn't need her pacemaker and the fact that her kidney stones on the right side potentially makes lithotripsy more acceptable an alternative. I will discuss this with Dr. Karsten Ro  She has had infrequent episodes of atrial  fibrillation. It occurred at night. She'll continue on warfarin. She did not tolerate ELIQUIS

## 2015-01-18 ENCOUNTER — Ambulatory Visit (INDEPENDENT_AMBULATORY_CARE_PROVIDER_SITE_OTHER): Payer: Medicare Other | Admitting: *Deleted

## 2015-01-18 DIAGNOSIS — Z5181 Encounter for therapeutic drug level monitoring: Secondary | ICD-10-CM

## 2015-01-18 DIAGNOSIS — Z7901 Long term (current) use of anticoagulants: Secondary | ICD-10-CM | POA: Diagnosis not present

## 2015-01-18 DIAGNOSIS — I4891 Unspecified atrial fibrillation: Secondary | ICD-10-CM | POA: Diagnosis not present

## 2015-01-18 DIAGNOSIS — I6350 Cerebral infarction due to unspecified occlusion or stenosis of unspecified cerebral artery: Secondary | ICD-10-CM

## 2015-01-18 DIAGNOSIS — I639 Cerebral infarction, unspecified: Secondary | ICD-10-CM

## 2015-01-18 LAB — POCT INR: INR: 2.3

## 2015-01-19 DIAGNOSIS — H04123 Dry eye syndrome of bilateral lacrimal glands: Secondary | ICD-10-CM | POA: Diagnosis not present

## 2015-01-19 LAB — HM DIABETES EYE EXAM

## 2015-01-24 DIAGNOSIS — E668 Other obesity: Secondary | ICD-10-CM | POA: Diagnosis not present

## 2015-01-24 DIAGNOSIS — I779 Disorder of arteries and arterioles, unspecified: Secondary | ICD-10-CM | POA: Diagnosis not present

## 2015-01-24 DIAGNOSIS — E890 Postprocedural hypothyroidism: Secondary | ICD-10-CM | POA: Diagnosis not present

## 2015-01-24 DIAGNOSIS — N200 Calculus of kidney: Secondary | ICD-10-CM | POA: Diagnosis not present

## 2015-01-24 DIAGNOSIS — E785 Hyperlipidemia, unspecified: Secondary | ICD-10-CM | POA: Diagnosis not present

## 2015-01-24 DIAGNOSIS — K635 Polyp of colon: Secondary | ICD-10-CM | POA: Diagnosis not present

## 2015-01-24 DIAGNOSIS — C73 Malignant neoplasm of thyroid gland: Secondary | ICD-10-CM | POA: Diagnosis not present

## 2015-01-24 DIAGNOSIS — C50911 Malignant neoplasm of unspecified site of right female breast: Secondary | ICD-10-CM | POA: Diagnosis not present

## 2015-01-24 DIAGNOSIS — I480 Paroxysmal atrial fibrillation: Secondary | ICD-10-CM | POA: Diagnosis not present

## 2015-01-24 DIAGNOSIS — Z6839 Body mass index (BMI) 39.0-39.9, adult: Secondary | ICD-10-CM | POA: Diagnosis not present

## 2015-01-25 ENCOUNTER — Ambulatory Visit (INDEPENDENT_AMBULATORY_CARE_PROVIDER_SITE_OTHER): Payer: Medicare Other | Admitting: *Deleted

## 2015-01-25 DIAGNOSIS — I4891 Unspecified atrial fibrillation: Secondary | ICD-10-CM

## 2015-01-25 DIAGNOSIS — Z7901 Long term (current) use of anticoagulants: Secondary | ICD-10-CM

## 2015-01-25 DIAGNOSIS — I639 Cerebral infarction, unspecified: Secondary | ICD-10-CM | POA: Diagnosis not present

## 2015-01-25 DIAGNOSIS — Z5181 Encounter for therapeutic drug level monitoring: Secondary | ICD-10-CM

## 2015-01-25 DIAGNOSIS — I6350 Cerebral infarction due to unspecified occlusion or stenosis of unspecified cerebral artery: Secondary | ICD-10-CM

## 2015-01-25 LAB — POCT INR: INR: 2.9

## 2015-01-26 DIAGNOSIS — N201 Calculus of ureter: Secondary | ICD-10-CM | POA: Diagnosis not present

## 2015-01-27 ENCOUNTER — Other Ambulatory Visit: Payer: Self-pay | Admitting: Urology

## 2015-02-03 ENCOUNTER — Other Ambulatory Visit (HOSPITAL_COMMUNITY): Payer: Medicare Other

## 2015-02-03 ENCOUNTER — Encounter: Payer: Self-pay | Admitting: Internal Medicine

## 2015-02-03 ENCOUNTER — Ambulatory Visit (INDEPENDENT_AMBULATORY_CARE_PROVIDER_SITE_OTHER): Payer: Medicare Other | Admitting: Pharmacist

## 2015-02-03 ENCOUNTER — Encounter (HOSPITAL_COMMUNITY): Payer: Self-pay

## 2015-02-03 ENCOUNTER — Encounter (HOSPITAL_COMMUNITY)
Admission: RE | Admit: 2015-02-03 | Discharge: 2015-02-03 | Disposition: A | Discharge status: Home / Self Care | Payer: Medicare Other | Source: Ambulatory Visit | Attending: Urology | Admitting: Urology

## 2015-02-03 DIAGNOSIS — G4730 Sleep apnea, unspecified: Secondary | ICD-10-CM | POA: Diagnosis not present

## 2015-02-03 DIAGNOSIS — Z6837 Body mass index (BMI) 37.0-37.9, adult: Secondary | ICD-10-CM | POA: Diagnosis not present

## 2015-02-03 DIAGNOSIS — Z85850 Personal history of malignant neoplasm of thyroid: Secondary | ICD-10-CM | POA: Diagnosis not present

## 2015-02-03 DIAGNOSIS — Z950 Presence of cardiac pacemaker: Secondary | ICD-10-CM | POA: Diagnosis not present

## 2015-02-03 DIAGNOSIS — Z7901 Long term (current) use of anticoagulants: Secondary | ICD-10-CM

## 2015-02-03 DIAGNOSIS — I639 Cerebral infarction, unspecified: Secondary | ICD-10-CM

## 2015-02-03 DIAGNOSIS — Z5181 Encounter for therapeutic drug level monitoring: Secondary | ICD-10-CM | POA: Diagnosis not present

## 2015-02-03 DIAGNOSIS — I4891 Unspecified atrial fibrillation: Secondary | ICD-10-CM

## 2015-02-03 DIAGNOSIS — N201 Calculus of ureter: Secondary | ICD-10-CM | POA: Diagnosis not present

## 2015-02-03 DIAGNOSIS — Z79899 Other long term (current) drug therapy: Secondary | ICD-10-CM | POA: Diagnosis not present

## 2015-02-03 DIAGNOSIS — E039 Hypothyroidism, unspecified: Secondary | ICD-10-CM | POA: Diagnosis not present

## 2015-02-03 DIAGNOSIS — Z853 Personal history of malignant neoplasm of breast: Secondary | ICD-10-CM | POA: Diagnosis not present

## 2015-02-03 DIAGNOSIS — M1990 Unspecified osteoarthritis, unspecified site: Secondary | ICD-10-CM | POA: Diagnosis not present

## 2015-02-03 DIAGNOSIS — Z8673 Personal history of transient ischemic attack (TIA), and cerebral infarction without residual deficits: Secondary | ICD-10-CM | POA: Diagnosis not present

## 2015-02-03 DIAGNOSIS — I6350 Cerebral infarction due to unspecified occlusion or stenosis of unspecified cerebral artery: Secondary | ICD-10-CM

## 2015-02-03 DIAGNOSIS — E780 Pure hypercholesterolemia: Secondary | ICD-10-CM | POA: Diagnosis not present

## 2015-02-03 DIAGNOSIS — I10 Essential (primary) hypertension: Secondary | ICD-10-CM | POA: Diagnosis not present

## 2015-02-03 DIAGNOSIS — G4733 Obstructive sleep apnea (adult) (pediatric): Secondary | ICD-10-CM | POA: Insufficient documentation

## 2015-02-03 LAB — CBC
HCT: 38.9 % (ref 36.0–46.0)
Hemoglobin: 12.1 g/dL (ref 12.0–15.0)
MCH: 28.5 pg (ref 26.0–34.0)
MCHC: 31.1 g/dL (ref 30.0–36.0)
MCV: 91.5 fL (ref 78.0–100.0)
Platelets: 210 10*3/uL (ref 150–400)
RBC: 4.25 MIL/uL (ref 3.87–5.11)
RDW: 14.6 % (ref 11.5–15.5)
WBC: 5.3 10*3/uL (ref 4.0–10.5)

## 2015-02-03 LAB — APTT: aPTT: 54 seconds — ABNORMAL HIGH (ref 24–37)

## 2015-02-03 LAB — BASIC METABOLIC PANEL
Anion gap: 4 — ABNORMAL LOW (ref 5–15)
BUN: 26 mg/dL — ABNORMAL HIGH (ref 6–20)
CALCIUM: 9 mg/dL (ref 8.9–10.3)
CO2: 27 mmol/L (ref 22–32)
Chloride: 109 mmol/L (ref 101–111)
Creatinine, Ser: 1.09 mg/dL — ABNORMAL HIGH (ref 0.44–1.00)
GFR calc Af Amer: 58 mL/min — ABNORMAL LOW (ref >60)
GFR calc non Af Amer: 50 mL/min — ABNORMAL LOW (ref >60)
GLUCOSE: 101 mg/dL — AB (ref 65–99)
POTASSIUM: 5 mmol/L (ref 3.5–5.1)
SODIUM: 140 mmol/L (ref 135–145)

## 2015-02-03 LAB — PROTIME-INR
INR: 2.13 — ABNORMAL HIGH (ref 0.00–1.49)
Prothrombin Time: 24 seconds — ABNORMAL HIGH (ref 11.6–15.2)

## 2015-02-03 NOTE — Progress Notes (Signed)
   02/03/15 0909  OBSTRUCTIVE SLEEP APNEA  Have you ever been diagnosed with sleep apnea through a sleep study? No  Do you snore loudly (loud enough to be heard through closed doors)?  1  Do you often feel tired, fatigued, or sleepy during the daytime? 0  Has anyone observed you stop breathing during your sleep? 1  Do you have, or are you being treated for high blood pressure? 0  BMI more than 35 kg/m2? 1  Age over 71 years old? 1  Neck circumference greater than 40 cm/16 inches? 0  Gender: 0  Obstructive Sleep Apnea Score 4

## 2015-02-03 NOTE — Pre-Procedure Instructions (Addendum)
01-12-15 EKG, epic 03-30-13 Echo, epic Implanted Cardiac Device sheet was returned without MD signature. Refaxed with note to include MD signature 02/03/15 08:20 Pt on Coumadin at home. To check PT/INR this visit. Will repeat day of surgery if abnormal.  A few abnormal lab results today (02/03/15 pre-surgical testing visit), including PT/INR. Pt on coumadin, ordered PT/INR for day of surgery. MD notified of abnormal labs.

## 2015-02-03 NOTE — Patient Instructions (Addendum)
[place]  02/03/2015   Your procedure is scheduled on: Monday 02/06/2015  Report to [facility] Main  Entrance and follow signs to               [place] at 9:30AM.  Call this number if you have problems the morning of surgery [phone]   Remember: ONLY 1 PERSON MAY GO WITH YOU TO SHORT STAY TO GET  READY MORNING OF [place].  Do not eat food or drink liquids: After Midnight.    Take these medicines the morning of surgery with A SIP OF WATER: Amiodarone (Pacerone), Levothyroxine (Syntrhoid), eye drops if needed                                You may not have any metal on your body including hair pins and              piercings  Do not wear jewelry, make-up, lotions, powders or perfumes.             Do not wear nail polish.  Do not shave  48 hours prior to surgery.              Men may shave face and neck.   Do not bring valuables to the hospital. [place].  Contacts, dentures or bridgework may not be worn into surgery.  Leave suitcase in the car. After surgery it may be brought to your room.     Patients discharged the day of surgery will not be allowed to drive home.  Name and phone number of your driver:  Special Instructions: N/A              Please read over the following fact sheets you were given: _____________________________________________________________________             [facility] - Preparing for Surgery Before surgery, you can play an important role.  Because skin is not sterile, your skin needs to be as free of germs as possible.  You can reduce the number of germs on your skin by washing with CHG (chlorahexidine gluconate) soap before surgery.  CHG is an antiseptic cleaner which kills germs and bonds with the skin to continue killing germs even after washing. Please DO NOT use if you have an allergy to CHG or antibacterial soaps.  If your skin becomes  reddened/irritated stop using the CHG and inform your nurse when you arrive at Short Stay. Do not shave (including legs and underarms) for at least 48 hours prior to the first CHG shower.  You may shave your face/neck. Please follow these instructions carefully:  1.  Shower with CHG Soap the night before surgery and the  morning of Surgery.  2.  If you choose to wash your hair, wash your hair first as usual with your  normal  shampoo.  3.  After you shampoo, rinse your hair and body thoroughly to remove the  shampoo.                           4.  Use CHG as you would any other liquid soap.  You can apply chg directly  to the skin  and wash                       Gently with a scrungie or clean washcloth.  5.  Apply the CHG Soap to your body ONLY FROM THE NECK DOWN.   Do not use on face/ open                           Wound or open sores. Avoid contact with eyes, ears mouth and genitals (private parts).                       Wash face,  Genitals (private parts) with your normal soap.             6.  Wash thoroughly, paying special attention to the area where your surgery  will be performed.  7.  Thoroughly rinse your body with warm water from the neck down.  8.  DO NOT shower/wash with your normal soap after using and rinsing off  the CHG Soap.                9.  Pat yourself dry with a clean towel.            10.  Wear clean pajamas.            11.  Place clean sheets on your bed the night of your first shower and do not  sleep with pets. Day of Surgery : Do not apply any lotions/deodorants the morning of surgery.  Please wear clean clothes to the hospital/surgery center.  FAILURE TO FOLLOW THESE INSTRUCTIONS MAY RESULT IN THE CANCELLATION OF YOUR SURGERY PATIENT SIGNATURE_________________________________  NURSE SIGNATURE__________________________________  ________________________________________________________________________

## 2015-02-06 ENCOUNTER — Ambulatory Visit (HOSPITAL_COMMUNITY): Payer: Medicare Other | Admitting: Anesthesiology

## 2015-02-06 ENCOUNTER — Encounter (HOSPITAL_COMMUNITY)
Admission: RE | Disposition: A | Discharge status: Home / Self Care | Payer: Self-pay | Source: Ambulatory Visit | Attending: Urology

## 2015-02-06 ENCOUNTER — Encounter (HOSPITAL_COMMUNITY): Payer: Self-pay | Admitting: Anesthesiology

## 2015-02-06 ENCOUNTER — Ambulatory Visit (HOSPITAL_COMMUNITY)
Admission: RE | Admit: 2015-02-06 | Discharge: 2015-02-06 | Disposition: A | Discharge status: Home / Self Care | Payer: Medicare Other | Source: Ambulatory Visit | Attending: Urology | Admitting: Urology

## 2015-02-06 ENCOUNTER — Ambulatory Visit (HOSPITAL_COMMUNITY): Payer: Medicare Other

## 2015-02-06 DIAGNOSIS — M1990 Unspecified osteoarthritis, unspecified site: Secondary | ICD-10-CM | POA: Diagnosis not present

## 2015-02-06 DIAGNOSIS — Z950 Presence of cardiac pacemaker: Secondary | ICD-10-CM | POA: Insufficient documentation

## 2015-02-06 DIAGNOSIS — E780 Pure hypercholesterolemia: Secondary | ICD-10-CM | POA: Insufficient documentation

## 2015-02-06 DIAGNOSIS — N200 Calculus of kidney: Secondary | ICD-10-CM | POA: Diagnosis not present

## 2015-02-06 DIAGNOSIS — I4891 Unspecified atrial fibrillation: Secondary | ICD-10-CM | POA: Insufficient documentation

## 2015-02-06 DIAGNOSIS — Z8673 Personal history of transient ischemic attack (TIA), and cerebral infarction without residual deficits: Secondary | ICD-10-CM | POA: Insufficient documentation

## 2015-02-06 DIAGNOSIS — N201 Calculus of ureter: Secondary | ICD-10-CM | POA: Diagnosis not present

## 2015-02-06 DIAGNOSIS — Z85850 Personal history of malignant neoplasm of thyroid: Secondary | ICD-10-CM | POA: Insufficient documentation

## 2015-02-06 DIAGNOSIS — G4730 Sleep apnea, unspecified: Secondary | ICD-10-CM | POA: Insufficient documentation

## 2015-02-06 DIAGNOSIS — Z7901 Long term (current) use of anticoagulants: Secondary | ICD-10-CM | POA: Insufficient documentation

## 2015-02-06 DIAGNOSIS — E039 Hypothyroidism, unspecified: Secondary | ICD-10-CM | POA: Diagnosis not present

## 2015-02-06 DIAGNOSIS — Z853 Personal history of malignant neoplasm of breast: Secondary | ICD-10-CM | POA: Insufficient documentation

## 2015-02-06 DIAGNOSIS — Z6837 Body mass index (BMI) 37.0-37.9, adult: Secondary | ICD-10-CM | POA: Insufficient documentation

## 2015-02-06 DIAGNOSIS — Z01818 Encounter for other preprocedural examination: Secondary | ICD-10-CM | POA: Diagnosis not present

## 2015-02-06 DIAGNOSIS — I10 Essential (primary) hypertension: Secondary | ICD-10-CM | POA: Diagnosis not present

## 2015-02-06 DIAGNOSIS — Z79899 Other long term (current) drug therapy: Secondary | ICD-10-CM | POA: Insufficient documentation

## 2015-02-06 HISTORY — PX: CYSTOSCOPY: SHX5120

## 2015-02-06 HISTORY — PX: HOLMIUM LASER APPLICATION: SHX5852

## 2015-02-06 HISTORY — PX: CYSTOSCOPY WITH RETROGRADE PYELOGRAM, URETEROSCOPY AND STENT PLACEMENT: SHX5789

## 2015-02-06 SURGERY — CYSTOURETEROSCOPY, WITH RETROGRADE PYELOGRAM AND STENT INSERTION
Anesthesia: General | Laterality: Right

## 2015-02-06 MED ORDER — PHENAZOPYRIDINE HCL 200 MG PO TABS
200.0000 mg | ORAL_TABLET | Freq: Once | ORAL | Status: AC
  Administered 2015-02-06: 200 mg via ORAL

## 2015-02-06 MED ORDER — PROPOFOL 10 MG/ML IV BOLUS
INTRAVENOUS | Status: DC | PRN
  Administered 2015-02-06: 200 mg via INTRAVENOUS

## 2015-02-06 MED ORDER — LACTATED RINGERS IV SOLN
INTRAVENOUS | Status: DC
  Administered 2015-02-06: 1000 mL via INTRAVENOUS

## 2015-02-06 MED ORDER — DEXAMETHASONE SODIUM PHOSPHATE 10 MG/ML IJ SOLN
INTRAMUSCULAR | Status: AC
  Filled 2015-02-06: qty 1

## 2015-02-06 MED ORDER — SODIUM CHLORIDE 0.9 % IR SOLN
Status: DC | PRN
  Administered 2015-02-06: 1000 mL

## 2015-02-06 MED ORDER — OXYBUTYNIN CHLORIDE 5 MG PO TABS
5.0000 mg | ORAL_TABLET | Freq: Once | ORAL | Status: AC
  Administered 2015-02-06: 5 mg via ORAL

## 2015-02-06 MED ORDER — OXYBUTYNIN CHLORIDE 5 MG PO TABS
ORAL_TABLET | ORAL | Status: AC
  Filled 2015-02-06: qty 1

## 2015-02-06 MED ORDER — 0.9 % SODIUM CHLORIDE (POUR BTL) OPTIME
TOPICAL | Status: DC | PRN
  Administered 2015-02-06: 1000 mL

## 2015-02-06 MED ORDER — TAMSULOSIN HCL 0.4 MG PO CAPS
0.4000 mg | ORAL_CAPSULE | Freq: Once | ORAL | Status: AC
  Administered 2015-02-06: 0.4 mg via ORAL
  Filled 2015-02-06: qty 1

## 2015-02-06 MED ORDER — OXYCODONE HCL 10 MG PO TABS: 10.0000 mg | ORAL_TABLET | ORAL | Status: DC | PRN

## 2015-02-06 MED ORDER — EPHEDRINE SULFATE 50 MG/ML IJ SOLN
INTRAMUSCULAR | Status: DC | PRN
  Administered 2015-02-06: 5 mg via INTRAVENOUS

## 2015-02-06 MED ORDER — LIDOCAINE HCL (CARDIAC) 20 MG/ML IV SOLN
INTRAVENOUS | Status: AC
  Filled 2015-02-06: qty 5

## 2015-02-06 MED ORDER — SODIUM CHLORIDE 0.9 % IR SOLN
Status: DC | PRN
  Administered 2015-02-06: 3000 mL

## 2015-02-06 MED ORDER — IOHEXOL 300 MG/ML  SOLN
INTRAMUSCULAR | Status: DC | PRN
  Administered 2015-02-06: 4 mL

## 2015-02-06 MED ORDER — FENTANYL CITRATE (PF) 100 MCG/2ML IJ SOLN: 25.0000 ug | INTRAMUSCULAR | Status: DC | PRN

## 2015-02-06 MED ORDER — LIDOCAINE HCL (CARDIAC) 20 MG/ML IV SOLN
INTRAVENOUS | Status: DC | PRN
  Administered 2015-02-06: 100 mg via INTRAVENOUS

## 2015-02-06 MED ORDER — PHENAZOPYRIDINE HCL 200 MG PO TABS
ORAL_TABLET | ORAL | Status: AC
  Filled 2015-02-06: qty 1

## 2015-02-06 MED ORDER — PHENAZOPYRIDINE HCL 200 MG PO TABS: 200.0000 mg | ORAL_TABLET | Freq: Three times a day (TID) | ORAL | Status: DC | PRN

## 2015-02-06 MED ORDER — FENTANYL CITRATE (PF) 100 MCG/2ML IJ SOLN
INTRAMUSCULAR | Status: AC
  Filled 2015-02-06: qty 2

## 2015-02-06 MED ORDER — LACTATED RINGERS IV SOLN: INTRAVENOUS | Status: DC

## 2015-02-06 MED ORDER — CIPROFLOXACIN IN D5W 200 MG/100ML IV SOLN
200.0000 mg | INTRAVENOUS | Status: AC
  Administered 2015-02-06: 200 mg via INTRAVENOUS
  Filled 2015-02-06: qty 100

## 2015-02-06 MED ORDER — DEXAMETHASONE SODIUM PHOSPHATE 10 MG/ML IJ SOLN
INTRAMUSCULAR | Status: DC | PRN
  Administered 2015-02-06: 10 mg via INTRAVENOUS

## 2015-02-06 MED ORDER — ONDANSETRON HCL 4 MG/2ML IJ SOLN
INTRAMUSCULAR | Status: AC
  Filled 2015-02-06: qty 2

## 2015-02-06 MED ORDER — OXYCODONE HCL 5 MG PO TABS
10.0000 mg | ORAL_TABLET | ORAL | Status: DC | PRN
  Administered 2015-02-06: 10 mg via ORAL
  Filled 2015-02-06: qty 2

## 2015-02-06 MED ORDER — FENTANYL CITRATE (PF) 100 MCG/2ML IJ SOLN
INTRAMUSCULAR | Status: DC | PRN
  Administered 2015-02-06 (×4): 25 ug via INTRAVENOUS

## 2015-02-06 MED ORDER — MIRABEGRON ER 50 MG PO TB24: 50.0000 mg | ORAL_TABLET | Freq: Every day | ORAL | Status: DC

## 2015-02-06 MED ORDER — ONDANSETRON HCL 4 MG/2ML IJ SOLN
INTRAMUSCULAR | Status: DC | PRN
  Administered 2015-02-06: 4 mg via INTRAVENOUS

## 2015-02-06 MED ORDER — PROPOFOL 10 MG/ML IV BOLUS
INTRAVENOUS | Status: AC
  Filled 2015-02-06: qty 20

## 2015-02-06 SURGICAL SUPPLY — 20 items
BAG URO CATCHER STRL LF (DRAPE) ×3 IMPLANT
BASKET LASER NITINOL 1.9FR (BASKET) ×3 IMPLANT
BASKET ZERO TIP NITINOL 2.4FR (BASKET) IMPLANT
CATH INTERMIT  6FR 70CM (CATHETERS) ×3 IMPLANT
CLOTH BEACON ORANGE TIMEOUT ST (SAFETY) ×3 IMPLANT
FIBER LASER FLEXIVA 1000 (UROLOGICAL SUPPLIES) IMPLANT
FIBER LASER FLEXIVA 200 (UROLOGICAL SUPPLIES) ×3 IMPLANT
FIBER LASER FLEXIVA 365 (UROLOGICAL SUPPLIES) IMPLANT
FIBER LASER FLEXIVA 550 (UROLOGICAL SUPPLIES) IMPLANT
FIBER LASER TRAC TIP (UROLOGICAL SUPPLIES) ×3 IMPLANT
GLOVE BIOGEL M 8.0 STRL (GLOVE) ×3 IMPLANT
GOWN STRL REUS W/ TWL XL LVL3 (GOWN DISPOSABLE) ×2 IMPLANT
GOWN STRL REUS W/TWL XL LVL3 (GOWN DISPOSABLE) ×4 IMPLANT
GUIDEWIRE ANG ZIPWIRE 038X150 (WIRE) IMPLANT
GUIDEWIRE STR DUAL SENSOR (WIRE) ×3 IMPLANT
MANIFOLD NEPTUNE II (INSTRUMENTS) ×3 IMPLANT
PACK CYSTO (CUSTOM PROCEDURE TRAY) ×3 IMPLANT
SHEATH ACCESS URETERAL 24CM (SHEATH) ×3 IMPLANT
STENT URET 6FRX24 CONTOUR (STENTS) ×3 IMPLANT
TUBING CONNECTING 10 (TUBING) ×3 IMPLANT

## 2015-02-06 NOTE — Progress Notes (Signed)
Patient up to bathroom multiple times to urinate. She is very uncomfortable with stent and states she may rip it out early. RN explains rationale for stent and recommends she call Dr Karsten Ro if she is unable to tolerated stent. She verbalizes understanding.

## 2015-02-06 NOTE — OR Nursing (Signed)
Stone taken per Dr. Karsten Ro.

## 2015-02-06 NOTE — Discharge Instructions (Signed)
Post stone removal/stent placement surgery instructions   Definitions:  Ureter: The duct that transports urine from the kidney to the bladder. Stent: A plastic hollow tube that is placed into the ureter, from the kidney to the bladder to prevent the ureter from swelling shut.  General instructions:  Despite the fact that no skin incisions were used, the area around the ureter and bladder is raw and irritated. The stent is a foreign body which will further irritate the bladder wall. This irritation is manifested by increased frequency of urination, both day and night, and by an increase in the urge to urinate. In some, the urge to urinate is present almost always. Sometimes the urge is strong enough that you may not be able to stop your self from urinating. The only real cure is to remove the stent and then give time for the bladder wall to heal which can't be done until the danger of the ureter swelling shut has passed. (This varies from 2-21 days).  You may see some blood in your urine while the stent is in place and a few days afterward. Do not be alarmed, even if the urine is clear for a while. Get off your feet and drink lots of fluids until clearing occurs. If you start to pass clots or don't improve, call us.  If you have a string coming from your urethra:  The stent string is attached to your ureteral stent.  Do not pull on thisIf you have a string coming from your urethra:  The stent string is attached to your ureteral stent.  Do not pull on this.  Diet:  You may return to your normal diet immediately. Because of the raw surface of your bladder, alcohol, spicy foods, foods high in acid and drinks with caffeine may cause irritation or frequency and should be used in moderation. To keep your urine flowing freely and avoid constipation, drink plenty of fluids during the day (8-10 glasses). Tip: Avoid cranberry juice because it is very acidic.  Activity:  Your physical activity doesn't need  to be restricted. However, if you are very active, you may see some blood in the urine. We suggest that you reduce your activity under the circumstances until the bleeding has stopped.  Bowels:  It is important to keep your bowels regular during the postoperative period. Straining with bowel movements can cause bleeding. A bowel movement every other day is reasonable. Use a mild laxative if needed, such as milk of magnesia 2-3 tablespoons, or 2 Dulcolax tablets. Call if you continue to have problems. If you had been taking narcotics for pain, before, during or after your surgery, you may be constipated. Take a laxative if necessary.     Medication:  You should resume your pre-surgery medications unless told not to. DO NOT RESUME YOUR ASPIRIN, or any other medicines like ibuprofen, motrin, excedrin, advil, aleve, vitamin E, fish oil as these can all cause bleeding x 7 days. In addition you may be given an antibiotic to prevent or treat infection. Antibiotics are not always necessary. All medication should be taken as prescribed until the bottles are finished unless you are having an unusual reaction to one of the drugs.  Problems you should report to [place]:  a. Fever greater than 101F. b. Heavy bleeding, or clots (see notes above about blood in urine). c. Inability to urinate. d. Drug reactions (hives, rash, nausea, vomiting, diarrhea). e. Severe burning or pain with urination that is not improving.  Followup:  You will need a followup appointment to monitor your progress in most cases. Please call the office for this appointment when you get home if your appointment has not already been scheduled. Usually the first appointment will be about 5-14 days after your surgery and if you have a stent in place it will likely be removed at that time.    General Anesthesia, Care After Refer to this sheet in the next few weeks. These instructions provide you with information on caring for yourself  after your procedure. Your health care provider may also give you more specific instructions. Your treatment has been planned according to current medical practices, but problems sometimes occur. Call your health care provider if you have any problems or questions after your procedure. WHAT TO EXPECT AFTER THE PROCEDURE After the procedure, it is typical to experience:  Sleepiness.  Nausea and vomiting. HOME CARE INSTRUCTIONS  For the first 24 hours after general anesthesia:  Have a responsible person with you.  Do not drive a car. If you are alone, do not take public transportation.  Do not drink alcohol.  Do not take medicine that has not been prescribed by your health care provider.  Do not sign important papers or make important decisions.  You may resume a normal diet and activities as directed by your health care provider.  Change bandages (dressings) as directed.  If you have questions or problems that seem related to general anesthesia, call the hospital and ask for the anesthetist or anesthesiologist on call. SEEK MEDICAL CARE IF:  You have nausea and vomiting that continue the day after anesthesia.  You develop a rash. SEEK IMMEDIATE MEDICAL CARE IF:   You have difficulty breathing.  You have chest pain.  You have any allergic problems. Document Released: 12/16/2000 Document Revised: 09/14/2013 Document Reviewed: 03/25/2013 [facility] Patient Information 2015 [place], [place]. This information is not intended to replace advice given to you by your health care provider. Make sure you discuss any questions you have with your health care provider.

## 2015-02-06 NOTE — Anesthesia Preprocedure Evaluation (Addendum)
Anesthesia Evaluation  Patient identified by MRN, date of birth, ID band Patient awake    Reviewed: Allergy & Precautions, H&P , NPO status , Patient's Chart, lab work & pertinent test results  Airway Mallampati: II  TM Distance: >3 FB Neck ROM: full    Dental no notable dental hx. (+) Teeth Intact, Dental Advisory Given   Pulmonary shortness of breath and with exertion, sleep apnea ,  breath sounds clear to auscultation  Pulmonary exam normal       Cardiovascular Exercise Tolerance: Good negative cardio ROS Normal cardiovascular exam+ dysrhythmias Atrial Fibrillation + pacemaker Rhythm:regular Rate:Normal  History ablation for AF   Neuro/Psych negative neurological ROS  negative psych ROS   GI/Hepatic negative GI ROS, Neg liver ROS, (+) Hepatitis -  Endo/Other  negative endocrine ROSHypothyroidism   Renal/GU negative Renal ROS  negative genitourinary   Musculoskeletal   Abdominal   Peds  Hematology negative hematology ROS (+)   Anesthesia Other Findings   Reproductive/Obstetrics negative OB ROS                            Anesthesia Physical Anesthesia Plan  ASA: III  Anesthesia Plan: General   Post-op Pain Management:    Induction: Intravenous  Airway Management Planned: LMA  Additional Equipment:   Intra-op Plan:   Post-operative Plan:   Informed Consent: I have reviewed the patients History and Physical, chart, labs and discussed the procedure including the risks, benefits and alternatives for the proposed anesthesia with the patient or authorized representative who has indicated his/her understanding and acceptance.   Dental Advisory Given  Plan Discussed with: CRNA and Surgeon  Anesthesia Plan Comments:         Anesthesia Quick Evaluation

## 2015-02-06 NOTE — Anesthesia Procedure Notes (Signed)
Procedure Name: LMA Insertion Date/Time: 02/06/2015 11:43 AM Performed by: Danley Danker L Patient Re-evaluated:Patient Re-evaluated prior to inductionOxygen Delivery Method: Circle system utilized Preoxygenation: Pre-oxygenation with 100% oxygen Intubation Type: IV induction LMA: LMA inserted LMA Size: 4.0 Number of attempts: 1 Placement Confirmation: positive ETCO2 and breath sounds checked- equal and bilateral Tube secured with: Tape Dental Injury: Teeth and Oropharynx as per pre-operative assessment

## 2015-02-06 NOTE — Transfer of Care (Signed)
Immediate Anesthesia Transfer of Care Note  Patient: Kayla Harrison  Procedure(s) Performed: Procedure(s): RETROGRADE PYELOGRAM, RIGHT URETEROSCOPY STENT PLACEMENT (Right) HOLMIUM LASER APPLICATION (N/A) CYSTOSCOPY (N/A)  Patient Location: PACU  Anesthesia Type:General  Level of Consciousness: awake, alert  and oriented  Airway & Oxygen Therapy: Patient Spontanous Breathing and Patient connected to face mask oxygen  Post-op Assessment: Report given to RN and Post -op Vital signs reviewed and stable  Post vital signs: Reviewed and stable  Last Vitals:  Filed Vitals:   02/06/15 0938  BP: 154/67  Pulse: 64  Temp: 36.4 C  Resp: 18    Complications: No apparent anesthesia complications

## 2015-02-06 NOTE — H&P (Signed)
Kayla Harrison is a 69 year old female with a distal right ureteral stone.   History of Present Illness     Bilateral nephrolithiasis: CT scan 3/07 - bilateral nephrolithiasis. He was seen by Dr. Terance Hart for a right UPJ stone but failed to followup.  CT scan 09/10/12 - bilateral nephrolithiasis with a 14 x 9 mm stone in the right renal pelvis without obstruction (1400 Hounsfield units). A 4 mm stone was noted on the floor of the bladder.  KUB 02/18/13 revealed the large stone in her right kidney appears to be overlying the lower pole rather than renal pelvis as well as a second and possibly third stone on the right-hand side. On the left-hand side I can definitely see 2 stones in the lower pole.   CT scan 12/15/14 revealed bilateral nonobstructing renal calculi as well as a 6.5 mm stone in her distal right ureter.  Stone analysis: Calcium oxalate    Interval history: She was found to have a distal right ureteral stone as a cause of her gross hematuria and was placed on medical expulsive therapy. Follow-up imaging revealed the stone had progressed only a small amount she was not having any discomfort and we discussed the treatment options but she elected to continue with MET. She did have an episode of pain and thought she might of past the stone but she's been straining her urine and has not seen a stone pass.     Past Medical History Problems  1. History of Atrial fibrillation (I48.91) 2. History of Breast Cancer 3. History of hypercholesterolemia (Z86.39) 4. History of hypertension (Z86.79) 5. History of hypothyroidism (Z86.39) 6. History of Ischemic Stroke 7. History of Morbid obesity (E66.01) 8. History of Osteoarthritis 9. History of Thyroid Cancer  Surgical History Problems  1. History of Breast Surgery Lumpectomy 2. History of Cholecystectomy 3. History of Heart Surgery 4. History of Hysterectomy 5. History of Knee Replacement 6. History of Laparosc Gastric  Restrictive Proc By Adjustable Gastric Band 7. History of Lung Surgery 8. History of Oophorectomy - Bilateral (Removal Of Both Ovaries) 9. History of Pacemaker Placement 10. History of Pacemaker Placement 11. History of Thyroid Surgery Total Thyroidectomy 12. History of Tonsillectomy  Current Meds 1. Amiodarone HCl - 200 MG Oral Tablet; TAKE 1 TABLET Daily;  Therapy: (Recorded:18Sep2014) to Recorded 2. Coumadin 5 MG Oral Tablet; takes $RemoveBefor'5mg'hnNjgkewyuGM$  twice a week and 2.$RemoveBe'5mg'pTuOKSWsO$  5 times a week;  Therapy: (Recorded:19Apr2016) to Recorded 3. Flonase 50 MCG/ACT Nasal Suspension;  Therapy: (Recorded:19Apr2016) to Recorded 4. Furosemide 40 MG Oral Tablet;  Therapy: [id number] to Recorded 5. Levothyroxine Sodium 150 MCG Oral Tablet;  Therapy: (Recorded:18Sep2014) to Recorded 6. Multi Vitamin/Minerals TABS;  Therapy: (Recorded:18Sep2014) to Recorded 7. Pravastatin Sodium 40 MG Oral Tablet;  Therapy: [id number] to Recorded 8. Tamsulosin HCl - 0.4 MG Oral Capsule; TAKE 1 CAPSULE Daily;  Therapy: [id number] to (Last Rx:24Mar2016)  Requested for: 24Mar2016 Ordered 9. Vitamin D TABS;  Therapy: (Recorded:18Sep2014) to Recorded  Allergies Medication  1. EPINEPHrine HCl SOLN 2. Xarelto TABS  Family History Problems  1. Family history of Coronary Artery Disease : Father 2. Family history of Coronary Artery Disease : Paternal Grandfather 3. Family history of Diabetes Mellitus : Paternal Grandfather 4. Family history of Diabetes Mellitus : Father 5. Family history of Urologic Disorder : Father   kidney stones, blood in urine  Social History Problems  1. Denied: Alcohol Use 2. Denied: Caffeine Use 3. Marital History - Single 4. Never A Smoker 5. Occupation:  executive recruiter 6. Denied: Tobacco Use    Review of Systems Genitourinary, constitutional, skin, eye, otolaryngeal, hematologic/lymphatic, cardiovascular, pulmonary, endocrine, musculoskeletal, gastrointestinal, neurological and psychiatric  system(s) were reviewed and pertinent findings if present are noted.  Genitourinary: Flank pain, urinary urgency, nocturia and hematuria.  Constitutional: feeling tired (fatigue).  Integumentary: pruritus.  ENT: sore throat and sinus problems.  Hematologic/Lymphatic: a tendency to easily bruise.  Respiratory: cough.  Musculoskeletal: joint pain.    Vitals Vital Signs   Blood Pressure: 92 / 63 Heart Rate: 73 [id number] 11:25AM  BMI Calculated: 35.36 BSA Calculated: 2.09 Height: 5 ft 6 in Weight: 220 lb   Physical Exam Constitutional: Well nourished and well developed . No acute distress.  ENT:. The ears and nose are normal in appearance.  Neck: The appearance of the neck is normal and no neck mass is present.  Pulmonary: No respiratory distress and normal respiratory rhythm and effort.  Cardiovascular: Heart rate and rhythm are normal . No peripheral edema.  Abdomen: The abdomen is soft and nontender. No masses are palpated. No CVA tenderness. No hernias are palpable. No hepatosplenomegaly noted.  Lymphatics: The femoral and inguinal nodes are not enlarged or tender.  Skin: Normal skin turgor, no visible rash and no visible skin lesions.  Neuro/Psych:. Mood and affect are appropriate.     Results/Data Urine  COLOR YELLOW  APPEARANCE CLEAR  SPECIFIC GRAVITY 1.020  pH 5.5  GLUCOSE NEG mg/dL BILIRUBIN NEG  KETONE NEG mg/dL BLOOD LARGE  PROTEIN TRACE mg/dL UROBILINOGEN 0.2 mg/dL NITRITE NEG  LEUKOCYTE ESTERASE NEG  SQUAMOUS EPITHELIAL/HPF RARE  WBC 3-6 WBC/hpf RBC TNTC RBC/hpf BACTERIA RARE  CRYSTALS NONE SEEN  CASTS NONE SEEN   The following images/tracing/specimen were independently visualized:  KUB: Her stone is again noted in the distal right ureter. It has not progressed.  The following clinical lab reports were reviewed:  UA: Red cells were noted again today. There is no sign of infection.     Assessment   Her stone has not progressed and we therefore  discussed proceeding with treatment. She said that she had spoken to Dr. Caryl Comes who said he could turn off her pacemaker but if we did lithotripsy she would also have to be off of her Coumadin and would have to bridge with Lovenox which she would like to try to avoid. I therefore discussed proceeding with ureteroscopy with her on Coumadin and with the understanding that this places her at increased risk for the development of bleeding and prolongation of bleeding. I went over the procedure with her in detail, its risks and complications, the probability of success, the need for a stent after the procedure and the anticipated postoperative course. She is worried about having a stent so we discussed using a small stent and leaving a tether. I have answered her questions and she has elected to proceed.   Plan   She will be scheduled for right ureteroscopy and laser lithotripsy of a right distal ureteral stone on Coumadin.

## 2015-02-06 NOTE — Anesthesia Postprocedure Evaluation (Signed)
  Anesthesia Post-op Note  Patient: Kayla Harrison  Procedure(s) Performed: Procedure(s) (LRB): RETROGRADE PYELOGRAM, RIGHT URETEROSCOPY STENT PLACEMENT (Right) HOLMIUM LASER APPLICATION (N/A) CYSTOSCOPY (N/A)  Patient Location: PACU  Anesthesia Type: General  Level of Consciousness: awake and alert   Airway and Oxygen Therapy: Patient Spontanous Breathing  Post-op Pain: mild  Post-op Assessment: Post-op Vital signs reviewed, Patient's Cardiovascular Status Stable, Respiratory Function Stable, Patent Airway and No signs of Nausea or vomiting  Last Vitals:  Filed Vitals:   02/06/15 1315  BP: 156/62  Pulse: 59  Temp:   Resp: 15    Post-op Vital Signs: stable   Complications: No apparent anesthesia complications

## 2015-02-06 NOTE — Op Note (Signed)
PATIENT:  Kayla Harrison  PRE-OPERATIVE DIAGNOSIS:  right Ureteral calculus  POST-OPERATIVE DIAGNOSIS: Same  PROCEDURE:  1. Cystoscopy with right retrograde pyelogram including interpretation 2. Right ureteroscopy and laser lithotripsy 3. Right ureteral stone extraction 4. Right double-J stent placement  SURGEON: Claybon Jabs, MD  INDICATION: Kayla Harrison is a 69 year old female with a history of calculus disease who experienced gross hematuria and was found to have a stone in her distal right ureter. She was placed on medical expulsive therapy but her stone failed to pass and she has been having intermittent gross hematuria. We discussed the treatment options and she is elected to proceed with ureteroscopy. She has elected to remain on her Coumadin and understands the increased risk of postoperative bleeding.  ANESTHESIA:  General  EBL:  Minimal  DRAINS: 6 [place], 24 Center meter double-J stent in the right ureter (with string)  SPECIMEN:  Stone given to patient  DESCRIPTION OF PROCEDURE: The patient was taken to the major OR and placed on the table. General anesthesia was administered and then the patient was moved to the dorsal lithotomy position. The genitalia was sterilely prepped and draped. An official timeout was performed.  Initially the 31 French cystoscope with 30 lens was passed under direct vision. The bladder was then entered and fully inspected. It was noted be free of any tumors stones or inflammatory lesions. Ureteral orifices were of normal configuration and position. A 6 French open-ended ureteral catheter was then passed through the cystoscope into the ureteral orifice in order to perform a right retrograde pyelogram.  A retrograde pyelogram was performed by injecting full-strength contrast up the right ureter under direct fluoroscopic control. It revealed a filling defect in the ureter consistent with the stone seen on the preoperative KUB. The remainder  of the ureter was noted to be normal as was the intrarenal collecting system. I then passed a 0.038 inch floppy-tipped guidewire through the open ended catheter and into the area of the renal pelvis and this was left in place. The inner portion of a ureteral access sheath was then passed over the guidewire to gently dilate the intramural ureter. I then proceeded with ureteroscopy.  A 6 French rigid ureteroscope was then passed under direct into the bladder and into the right orifice and up the ureter. The stone was identified and I felt it was too large to extract and therefore elected to proceed with laser lithotripsy. The 200  holmium laser fiber was used to fragment the stone. I then used the nitinol basket to extract all of the stone fragments and reinspection of the ureter ureteroscopically revealed no further stone fragments and no injury to the ureter. I then backloaded the cystoscope over the guidewire and passed the stent over the guidewire into the area of the renal pelvis. As the guidewire was removed good curl was noted in the renal pelvis. The bladder was drained and the cystoscope was then removed. The patient tolerated the procedure well no intraoperative complications.  PLAN OF CARE: Discharge to home after PACU  PATIENT DISPOSITION:  PACU - hemodynamically stable.

## 2015-02-07 ENCOUNTER — Encounter (HOSPITAL_COMMUNITY): Payer: Self-pay | Admitting: Urology

## 2015-02-13 DIAGNOSIS — N3001 Acute cystitis with hematuria: Secondary | ICD-10-CM | POA: Diagnosis not present

## 2015-02-16 ENCOUNTER — Ambulatory Visit (INDEPENDENT_AMBULATORY_CARE_PROVIDER_SITE_OTHER): Payer: Medicare Other | Admitting: *Deleted

## 2015-02-16 DIAGNOSIS — I4891 Unspecified atrial fibrillation: Secondary | ICD-10-CM

## 2015-02-16 DIAGNOSIS — Z7901 Long term (current) use of anticoagulants: Secondary | ICD-10-CM | POA: Diagnosis not present

## 2015-02-16 DIAGNOSIS — I6350 Cerebral infarction due to unspecified occlusion or stenosis of unspecified cerebral artery: Secondary | ICD-10-CM

## 2015-02-16 DIAGNOSIS — Z5181 Encounter for therapeutic drug level monitoring: Secondary | ICD-10-CM | POA: Diagnosis not present

## 2015-02-16 DIAGNOSIS — I639 Cerebral infarction, unspecified: Secondary | ICD-10-CM | POA: Diagnosis not present

## 2015-02-16 LAB — POCT INR: INR: 2.2

## 2015-02-26 IMAGING — CR DG CHEST 2V
2 series · 2 of 2 positions shown · non-contrast
Comparison: The 05/12/2013

CLINICAL DATA: Shortness of breath, follow up, past history thyroid
cancer, breast cancer, pacemaker

EXAM:
CHEST  2 VIEW

[view not recorded (1 of 2)]
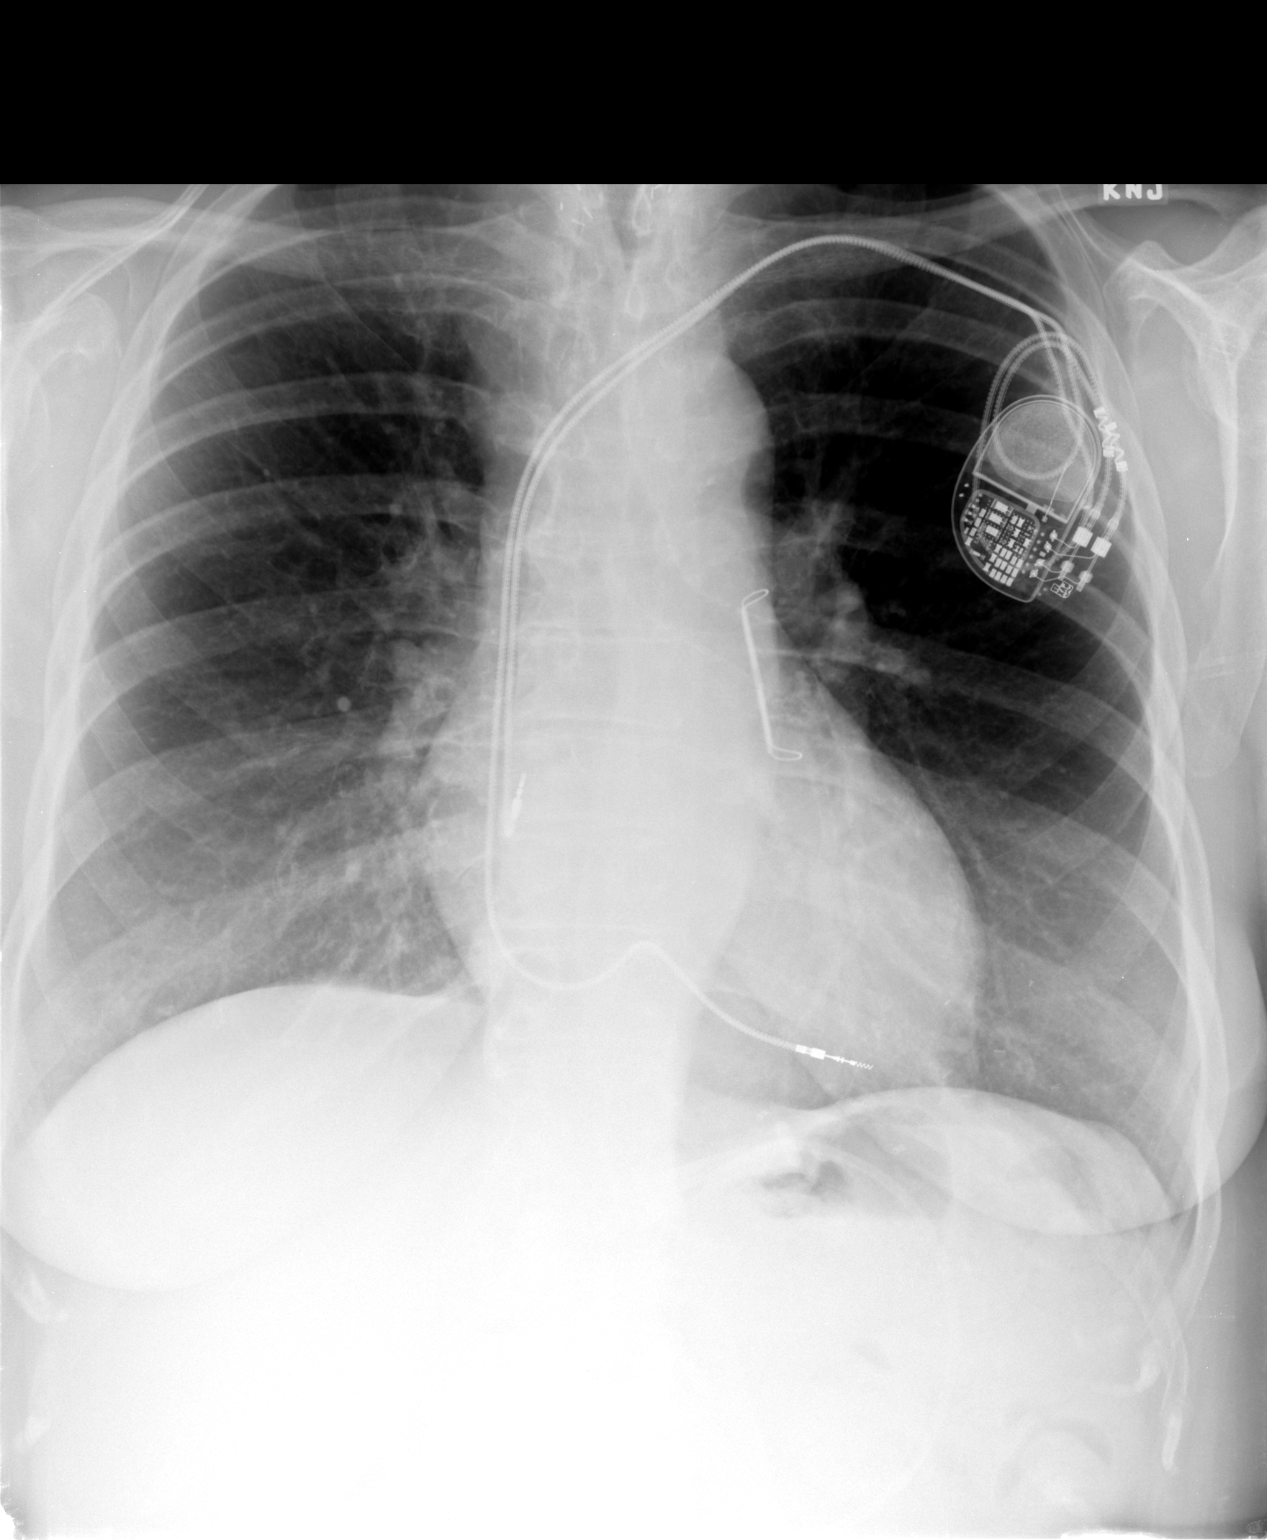

[view not recorded (2 of 2)]
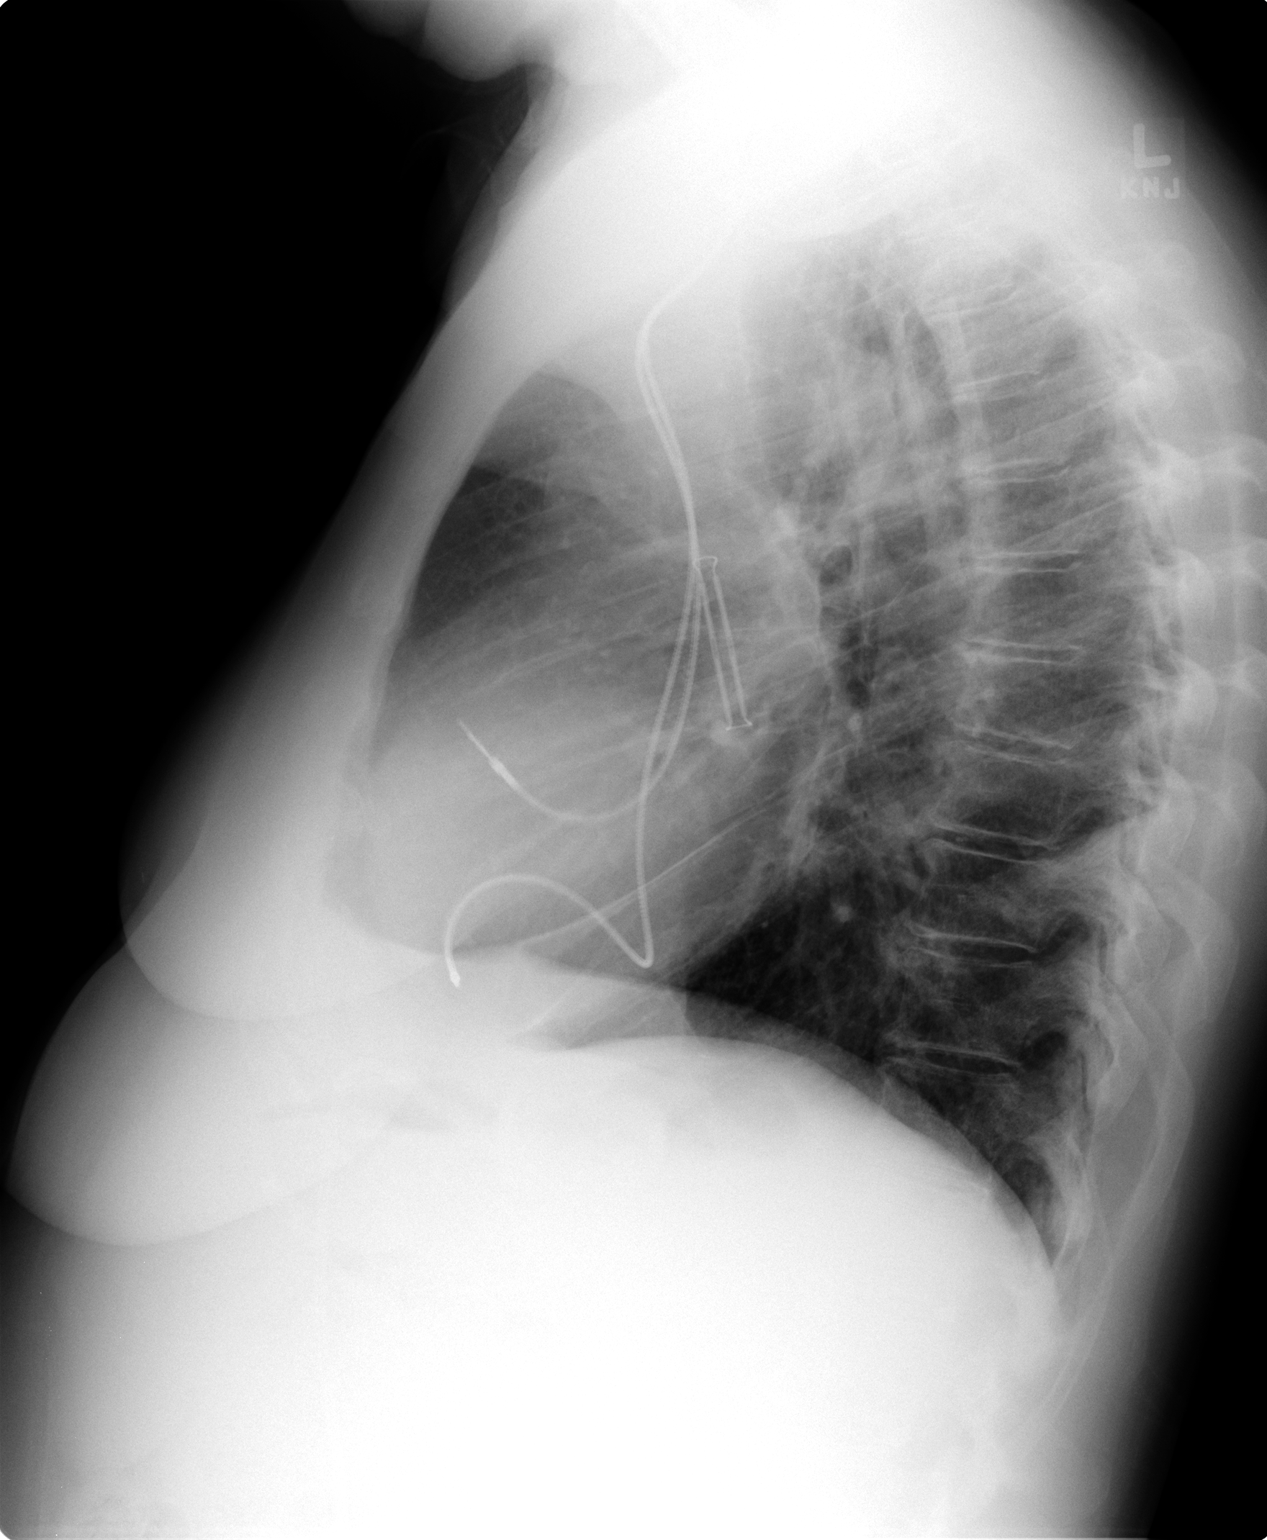

[2 of 2 positions shown; findings below may reference images not displayed]

FINDINGS: Left subclavian sequential transvenous pacemaker leads project at
right atrium and right ventricle.

Left atrial appendage clip again seen.

Upper normal heart size.

Tortuous aorta.

Pulmonary vascularity normal.

Surgical clips at the lower cervical region bilateral likely reflect
thyroidectomy.

Lungs clear.

No pleural effusion or pneumothorax.

No acute osseous findings.
IMPRESSION: No acute abnormalities.

## 2015-03-06 ENCOUNTER — Encounter: Payer: Self-pay | Admitting: Internal Medicine

## 2015-03-09 IMAGING — MG MM DIAGNOSTIC BILATERAL
5 series · 5 of 5 positions shown · non-contrast
Comparison: None.

CLINICAL DATA: 67-year-old female with history of left breast
cancer post lumpectomy in 7626 followed by radiation therapy.

EXAM:
DIGITAL DIAGNOSTIC  BILATERAL MAMMOGRAM WITH CAD

[R CC]
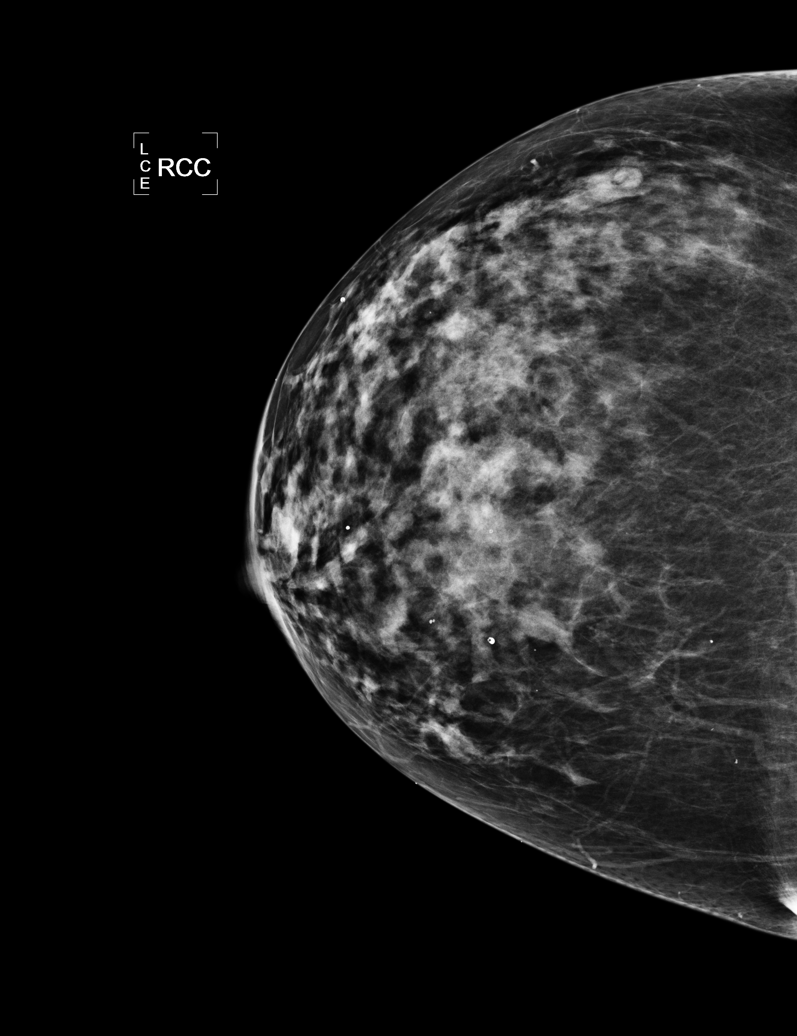

[L CC]
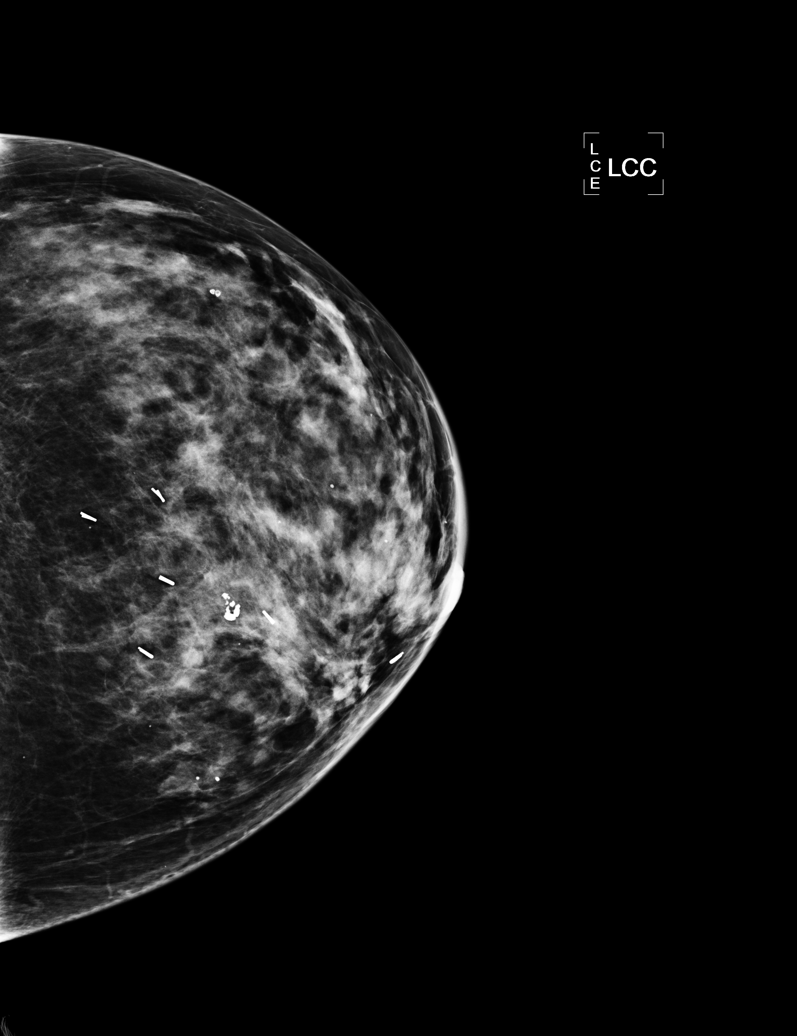

[L MLO]
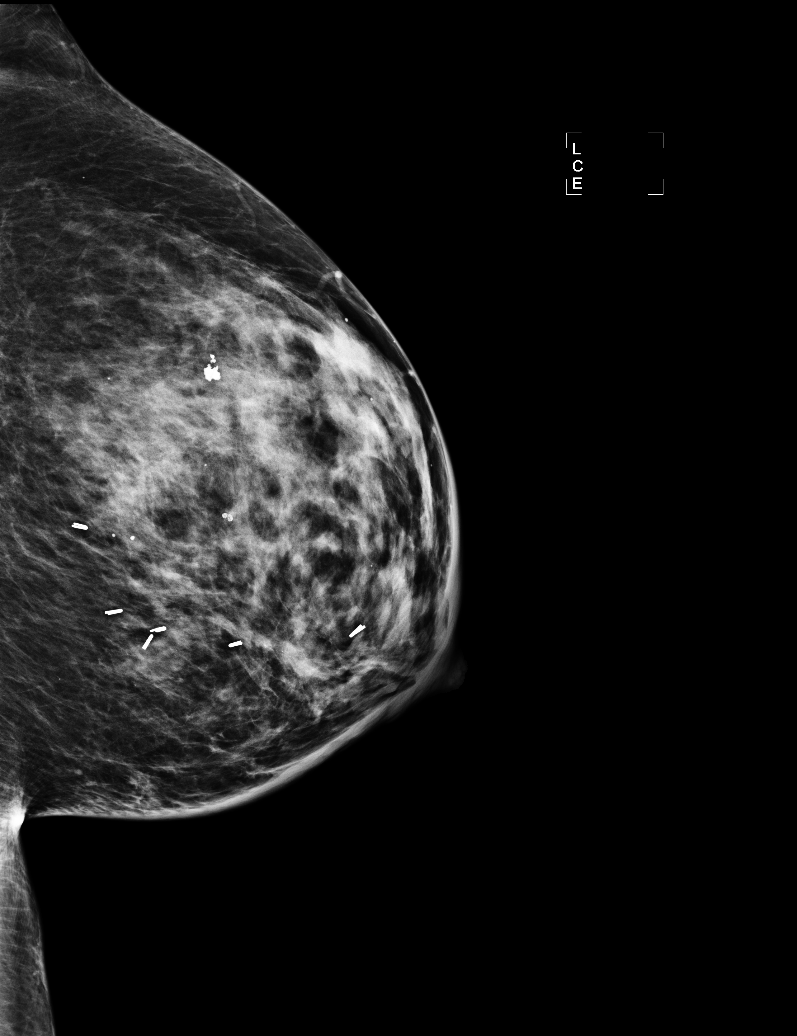

[R MLO]
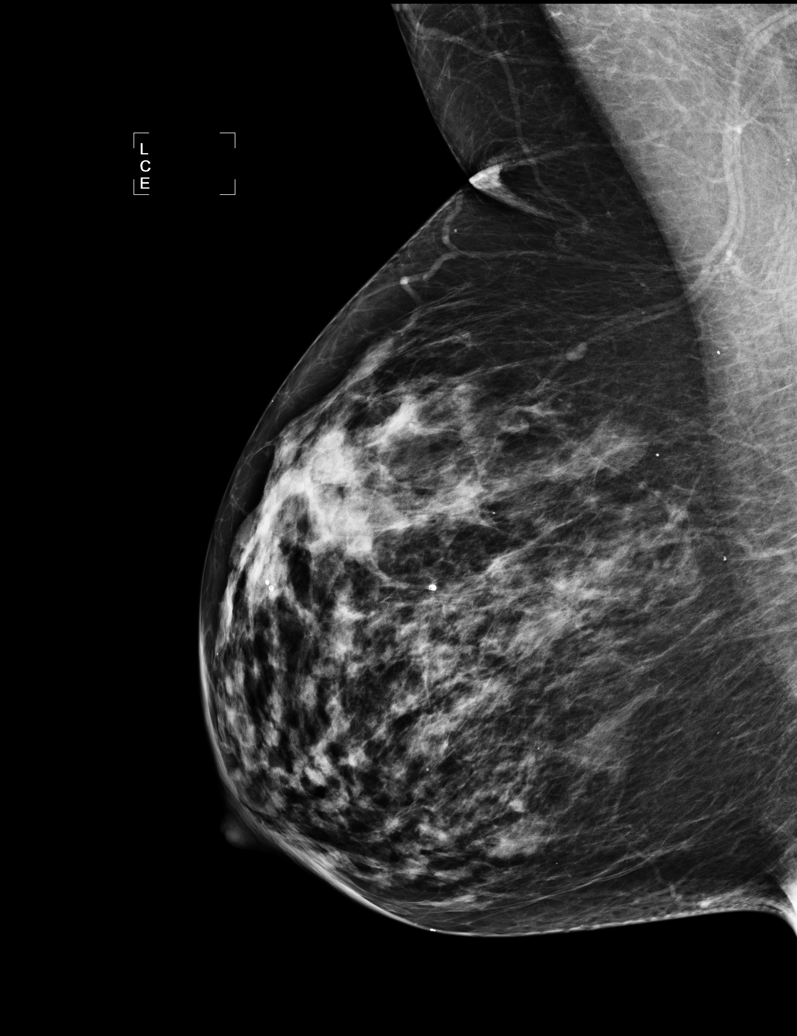

[L TAN]
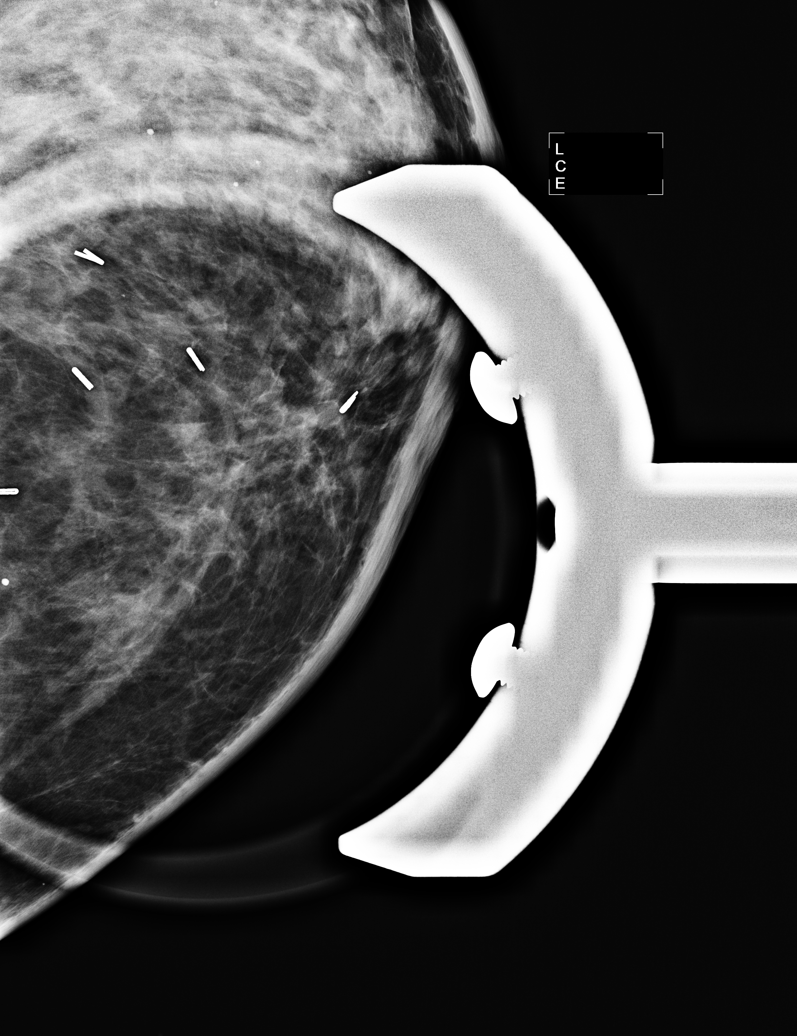

[5 of 5 positions shown; findings below may reference images not displayed]

ACR Breast Density Category c: The breasts are heterogeneously
dense, which may obscure small masses.
FINDINGS: Note that images of the left breast were limited due to the presence
of a pacemaker. No suspicious masses or calcifications are seen in
either breast. Lumpectomy changes are present in the left breast.
Spot compression magnification view of the lumpectomy site in the
left breast was performed. There is no mammographic evidence of
locally recurrent malignancy.

Mammographic images were processed with CAD.
IMPRESSION: No mammographic evidence of malignancy in either breast.

RECOMMENDATION:
Bilateral diagnostic mammography in 1 year.

I have discussed the findings and recommendations with the patient.
Results were also provided in writing at the conclusion of the
visit. If applicable, a reminder letter will be sent to the patient
regarding the next appointment.

BI-RADS CATEGORY  2: Benign Finding(s)

## 2015-03-16 ENCOUNTER — Ambulatory Visit (INDEPENDENT_AMBULATORY_CARE_PROVIDER_SITE_OTHER): Payer: Medicare Other | Admitting: *Deleted

## 2015-03-16 DIAGNOSIS — Z7901 Long term (current) use of anticoagulants: Secondary | ICD-10-CM

## 2015-03-16 DIAGNOSIS — I4891 Unspecified atrial fibrillation: Secondary | ICD-10-CM | POA: Diagnosis not present

## 2015-03-16 DIAGNOSIS — Z5181 Encounter for therapeutic drug level monitoring: Secondary | ICD-10-CM

## 2015-03-16 DIAGNOSIS — I6350 Cerebral infarction due to unspecified occlusion or stenosis of unspecified cerebral artery: Secondary | ICD-10-CM

## 2015-03-16 DIAGNOSIS — I639 Cerebral infarction, unspecified: Secondary | ICD-10-CM

## 2015-03-16 LAB — POCT INR: INR: 2.3

## 2015-04-13 ENCOUNTER — Ambulatory Visit (INDEPENDENT_AMBULATORY_CARE_PROVIDER_SITE_OTHER): Payer: Medicare Other | Admitting: *Deleted

## 2015-04-13 ENCOUNTER — Telehealth: Payer: Self-pay | Admitting: Cardiology

## 2015-04-13 DIAGNOSIS — Z9884 Bariatric surgery status: Secondary | ICD-10-CM | POA: Diagnosis not present

## 2015-04-13 DIAGNOSIS — I495 Sick sinus syndrome: Secondary | ICD-10-CM

## 2015-04-13 NOTE — Progress Notes (Signed)
Remote pacemaker transmission.   

## 2015-04-13 NOTE — Telephone Encounter (Signed)
LMOVM reminding pt to send remote transmission.   

## 2015-04-18 DIAGNOSIS — N200 Calculus of kidney: Secondary | ICD-10-CM | POA: Diagnosis not present

## 2015-04-18 DIAGNOSIS — N1330 Unspecified hydronephrosis: Secondary | ICD-10-CM | POA: Diagnosis not present

## 2015-04-20 ENCOUNTER — Ambulatory Visit (INDEPENDENT_AMBULATORY_CARE_PROVIDER_SITE_OTHER): Payer: Medicare Other

## 2015-04-20 DIAGNOSIS — Z7901 Long term (current) use of anticoagulants: Secondary | ICD-10-CM | POA: Diagnosis not present

## 2015-04-20 DIAGNOSIS — I6350 Cerebral infarction due to unspecified occlusion or stenosis of unspecified cerebral artery: Secondary | ICD-10-CM

## 2015-04-20 DIAGNOSIS — I639 Cerebral infarction, unspecified: Secondary | ICD-10-CM

## 2015-04-20 DIAGNOSIS — I4891 Unspecified atrial fibrillation: Secondary | ICD-10-CM

## 2015-04-20 DIAGNOSIS — Z5181 Encounter for therapeutic drug level monitoring: Secondary | ICD-10-CM

## 2015-04-20 LAB — CUP PACEART REMOTE DEVICE CHECK
Battery Remaining Longevity: 104 mo
Battery Voltage: 3.02 V
Brady Statistic AP VP Percent: 0 %
Brady Statistic AS VP Percent: 0.03 %
Brady Statistic AS VS Percent: 99.93 %
Brady Statistic RV Percent Paced: 0.04 %
Date Time Interrogation Session: 20160721172121
Lead Channel Impedance Value: 342 Ohm
Lead Channel Impedance Value: 418 Ohm
Lead Channel Impedance Value: 494 Ohm
Lead Channel Pacing Threshold Amplitude: 0.875 V
Lead Channel Pacing Threshold Amplitude: 1.625 V
Lead Channel Sensing Intrinsic Amplitude: 0.625 mV
Lead Channel Setting Pacing Amplitude: 2 V
Lead Channel Setting Pacing Amplitude: 3.25 V
Lead Channel Setting Pacing Pulse Width: 0.4 ms
Lead Channel Setting Sensing Sensitivity: 0.9 mV
MDC IDC MSMT LEADCHNL RA IMPEDANCE VALUE: 361 Ohm
MDC IDC MSMT LEADCHNL RA PACING THRESHOLD PULSEWIDTH: 0.4 ms
MDC IDC MSMT LEADCHNL RA SENSING INTR AMPL: 0.625 mV
MDC IDC MSMT LEADCHNL RV PACING THRESHOLD PULSEWIDTH: 0.4 ms
MDC IDC MSMT LEADCHNL RV SENSING INTR AMPL: 8.75 mV
MDC IDC MSMT LEADCHNL RV SENSING INTR AMPL: 8.75 mV
MDC IDC STAT BRADY AP VS PERCENT: 0.04 %
MDC IDC STAT BRADY RA PERCENT PACED: 0.04 %
Zone Setting Detection Interval: 350 ms
Zone Setting Detection Interval: 400 ms

## 2015-04-20 LAB — POCT INR: INR: 1.8

## 2015-04-21 DIAGNOSIS — H04123 Dry eye syndrome of bilateral lacrimal glands: Secondary | ICD-10-CM | POA: Diagnosis not present

## 2015-05-09 ENCOUNTER — Encounter: Payer: Self-pay | Admitting: Cardiology

## 2015-05-11 ENCOUNTER — Ambulatory Visit (INDEPENDENT_AMBULATORY_CARE_PROVIDER_SITE_OTHER): Payer: Medicare Other | Admitting: Pharmacist

## 2015-05-11 DIAGNOSIS — I639 Cerebral infarction, unspecified: Secondary | ICD-10-CM | POA: Diagnosis not present

## 2015-05-11 DIAGNOSIS — Z7901 Long term (current) use of anticoagulants: Secondary | ICD-10-CM

## 2015-05-11 DIAGNOSIS — I4891 Unspecified atrial fibrillation: Secondary | ICD-10-CM

## 2015-05-11 DIAGNOSIS — Z5181 Encounter for therapeutic drug level monitoring: Secondary | ICD-10-CM

## 2015-05-11 DIAGNOSIS — I6350 Cerebral infarction due to unspecified occlusion or stenosis of unspecified cerebral artery: Secondary | ICD-10-CM

## 2015-05-11 LAB — POCT INR: INR: 2.3

## 2015-05-18 ENCOUNTER — Encounter: Payer: Self-pay | Admitting: Internal Medicine

## 2015-05-18 DIAGNOSIS — N1330 Unspecified hydronephrosis: Secondary | ICD-10-CM | POA: Diagnosis not present

## 2015-05-18 DIAGNOSIS — N200 Calculus of kidney: Secondary | ICD-10-CM | POA: Diagnosis not present

## 2015-05-18 DIAGNOSIS — N132 Hydronephrosis with renal and ureteral calculous obstruction: Secondary | ICD-10-CM | POA: Diagnosis not present

## 2015-05-19 ENCOUNTER — Other Ambulatory Visit: Payer: Self-pay | Admitting: Emergency Medicine

## 2015-05-19 MED ORDER — PRAVASTATIN SODIUM 40 MG PO TABS: ORAL_TABLET | ORAL | Status: DC

## 2015-05-22 ENCOUNTER — Other Ambulatory Visit: Payer: Self-pay | Admitting: *Deleted

## 2015-05-22 MED ORDER — AMIODARONE HCL 200 MG PO TABS: 100.0000 mg | ORAL_TABLET | Freq: Every day | ORAL | Status: DC

## 2015-05-23 MED ORDER — WARFARIN SODIUM 5 MG PO TABS: ORAL_TABLET | ORAL | Status: DC

## 2015-06-01 ENCOUNTER — Ambulatory Visit (INDEPENDENT_AMBULATORY_CARE_PROVIDER_SITE_OTHER): Payer: Medicare Other | Admitting: Internal Medicine

## 2015-06-01 ENCOUNTER — Encounter: Payer: Self-pay | Admitting: Internal Medicine

## 2015-06-01 VITALS — BP 136/88 | HR 65 | Temp 97.9°F | Resp 16 | Wt 236.0 lb

## 2015-06-01 DIAGNOSIS — R51 Headache: Secondary | ICD-10-CM | POA: Diagnosis not present

## 2015-06-01 DIAGNOSIS — I639 Cerebral infarction, unspecified: Secondary | ICD-10-CM

## 2015-06-01 DIAGNOSIS — Z23 Encounter for immunization: Secondary | ICD-10-CM

## 2015-06-01 DIAGNOSIS — R05 Cough: Secondary | ICD-10-CM | POA: Diagnosis not present

## 2015-06-01 DIAGNOSIS — R059 Cough, unspecified: Secondary | ICD-10-CM

## 2015-06-01 DIAGNOSIS — R519 Headache, unspecified: Secondary | ICD-10-CM

## 2015-06-01 NOTE — Progress Notes (Signed)
Pre visit review using our clinic review tool, if applicable. No additional management support is needed unless otherwise documented below in the visit note. 

## 2015-06-01 NOTE — Patient Instructions (Signed)
Please keep a diary of your headaches . Document  each occurrence on the calendar with notation of : #1 any prodrome ( any non headache symptom such as marked fatigue,visual changes, ,etc ) which precedes actual headache ; #2) severity on 1-10 scale; #3) any triggers ( food/ drink,enviromenntal or weather changes ,physical or emotional stress) in 8-12 hour period prior to the headache; & #4) response to any medications or other intervention. Please review "Headache" @ [facility] for additional information.

## 2015-06-01 NOTE — Progress Notes (Signed)
Subjective:    Patient ID: Kayla Harrison, female    DOB: 1945-10-16, 69 y.o.   MRN: [id number]  HPI  She has had intermittent headaches approximately 2 times a month since April. This is in the context of some vision issues. She had blurred vision and was diagnosed with inflammation as well as dry eyes. She was treated with topical steroid and subsequently with maintenance Restasis eyedrops. The steroid eyedrops had to be repeated one month ago.  The headaches when present can last all day and are described as aching. They're in the retro-orbital area and do not radiate. If she does cover her eyes ; headaches will dissipate only to recur when she gets up and is active. Tylenol did not help; Aleve did. She does have fatigue with headaches.  She's had no associated neurologic or sensory deficits.  She has a history of bilateral cataract surgery.  She has an isolated intermittent nonproductive cough which is paroxysmal and not persistent. It reminds her of the symptoms she had when she had a pleural effusion in 2014. She has no associated symptoms of an upper respiratory tract infection or extrinsic symptoms.  She questions the need for bone density follow-up. This was done by her Endocrinologist, Dr. Forde Dandy possibly over 5 years ago. At that time bone density was normal. She'll review the indications with him for repeat study.  Review of Systems She specifically denies double vision or loss of vision.  She denies dry mouth.  She denies a significant swelling, redness, pain in the joints.  She has no tinnitus, hearing loss, loss of smell, loss of taste. There is no associated vertigo, dizziness, or syncope. There is no associated change in heart rhythm or rate.  She has no shortness of breath or chest pain prior to the headaches. She also denies weakness, numbness, tingling in extremities.  Frontal headache, facial pain , nasal purulence, dental pain, sore throat , otic pain or otic  discharge denied. No fever , chills or sweats. Extrinsic symptoms of itchy, watery eyes, sneezing, or angioedema are denied. There is no significant cough, sputum production, wheezing,or  paroxysmal nocturnal dyspnea.    Objective:   Physical Exam  Pertinent or positive findings include: She has minimal ptosis. Extraocular motion and field of vision are normal. She has slight wax accumulation on the right. Hearing is normal to whisper at 6 feet. She has fusiform changes in her knees with some crepitus. Pes planus noted. She was able to do heel and toe walking but did drift to the left with the walking. She states this is a phenomenon that occurs intermittently since her stroke. Romberg and finger-nose testing was negative. She had no cranial nerve deficits.  General appearance :adequately nourished; in no distress. BMI 38.11.  Eyes: No conjunctival inflammation or scleral icterus is present.  Oral exam:  Lips and gums are healthy appearing.There is no oropharyngeal erythema or exudate noted. Dental hygiene is good.  Heart:  Normal rate and regular rhythm. S1 and S2 normal without gallop, murmur, click, rub or other extra sounds    Lungs:Chest clear to auscultation; no wheezes, rhonchi,rales ,or rubs present.No increased work of breathing.   Abdomen: bowel sounds normal, soft and non-tender without masses, organomegaly or hernias noted.  No guarding or rebound.   Vascular : all pulses equal ; no bruits present.  Skin:Warm & dry.  Intact without suspicious lesions or rashes ; no tenting or jaundice   Lymphatic: No lymphadenopathy is noted about  the head, neck, axilla.   Neuro: Strength, tone good. DTRs 0+ @ knees.        Assessment & Plan:  #1 headache disorder; this is most likely related to her ophthalmologic issues.  #2 intermittent, paroxysmal cough. Exam is negative today; she'll return for Cxray if this recurs and is persistent.  #3 Xero- ophthalmia, as per  Ophthalmology  See after visit summary

## 2015-06-06 ENCOUNTER — Ambulatory Visit (INDEPENDENT_AMBULATORY_CARE_PROVIDER_SITE_OTHER): Payer: Medicare Other | Admitting: *Deleted

## 2015-06-06 DIAGNOSIS — I639 Cerebral infarction, unspecified: Secondary | ICD-10-CM | POA: Diagnosis not present

## 2015-06-06 DIAGNOSIS — Z7901 Long term (current) use of anticoagulants: Secondary | ICD-10-CM | POA: Diagnosis not present

## 2015-06-06 DIAGNOSIS — I4891 Unspecified atrial fibrillation: Secondary | ICD-10-CM | POA: Diagnosis not present

## 2015-06-06 DIAGNOSIS — Z5181 Encounter for therapeutic drug level monitoring: Secondary | ICD-10-CM | POA: Diagnosis not present

## 2015-06-06 DIAGNOSIS — I6350 Cerebral infarction due to unspecified occlusion or stenosis of unspecified cerebral artery: Secondary | ICD-10-CM

## 2015-06-06 LAB — POCT INR: INR: 2

## 2015-06-23 DIAGNOSIS — H04123 Dry eye syndrome of bilateral lacrimal glands: Secondary | ICD-10-CM | POA: Diagnosis not present

## 2015-06-27 ENCOUNTER — Encounter: Payer: Self-pay | Admitting: Internal Medicine

## 2015-06-27 ENCOUNTER — Ambulatory Visit (INDEPENDENT_AMBULATORY_CARE_PROVIDER_SITE_OTHER): Payer: Medicare Other | Admitting: Internal Medicine

## 2015-06-27 VITALS — BP 112/64 | HR 68 | Temp 98.1°F | Resp 16 | Wt 234.0 lb

## 2015-06-27 DIAGNOSIS — H65192 Other acute nonsuppurative otitis media, left ear: Secondary | ICD-10-CM

## 2015-06-27 DIAGNOSIS — I6350 Cerebral infarction due to unspecified occlusion or stenosis of unspecified cerebral artery: Secondary | ICD-10-CM

## 2015-06-27 DIAGNOSIS — J209 Acute bronchitis, unspecified: Secondary | ICD-10-CM

## 2015-06-27 DIAGNOSIS — J0110 Acute frontal sinusitis, unspecified: Secondary | ICD-10-CM

## 2015-06-27 DIAGNOSIS — Z23 Encounter for immunization: Secondary | ICD-10-CM | POA: Diagnosis not present

## 2015-06-27 MED ORDER — AMOXICILLIN-POT CLAVULANATE 875-125 MG PO TABS: 1.0000 | ORAL_TABLET | Freq: Two times a day (BID) | ORAL | Status: DC

## 2015-06-27 MED ORDER — HYDROCODONE-HOMATROPINE 5-1.5 MG/5ML PO SYRP: 5.0000 mL | ORAL_SOLUTION | Freq: Four times a day (QID) | ORAL | Status: DC | PRN

## 2015-06-27 NOTE — Progress Notes (Signed)
Pre visit review using our clinic review tool, if applicable. No additional management support is needed unless otherwise documented below in the visit note. 

## 2015-06-27 NOTE — Patient Instructions (Signed)
Plain Mucinex (NOT D) for thick secretions ;force NON dairy fluids .   Nasal cleansing in the shower as discussed with lather of mild shampoo.After 10 seconds wash off lather while  exhaling through nostrils. Make sure that all residual soap is removed to prevent irritation.  Flonase OR Nasacort AQ 1 spray in each nostril twice a day as needed. Use the "crossover" technique into opposite nostril spraying toward opposite ear @ 45 degree angle, not straight up into nostril.  Plain Allegra (NOT D )  160 daily , Loratidine 10 mg , OR Zyrtec 10 mg @ bedtime  as needed for itchy eyes & sneezing. 

## 2015-06-27 NOTE — Progress Notes (Signed)
   Subjective:    Patient ID: Kayla Harrison, female    DOB: [birth year], 69 y.o.   MRN: [id number]  HPI Her symptoms began 06/24/15 as bilateral ear pain as well as facial pain. This was associated with watery eyes, sore throat, frontal headache, & nasal purulence. All symptoms have persisted &  as of 10/3 she has had yellow sputum as well . The nasal secretions and sputum are of equal volume. She had malaise and slept the entire day. With the cough she developed pain in the lower back. She also has abdominal soreness with the cough.  10/2 she took NyQuil and DayQuil without relief. She did take Tylenol for the low back. She is on an anticoagulant as well as amiodarone.  She has had balance problems which this is making worse. Review of Systems She is not having frontal sinus area pain. She also is not having otic discharge. The cough is not associated with shortness of breath or wheezing. She has no associated extrinsic symptoms of significance.    Objective:   Physical Exam  There is a tiny area of hemorrhage in the right inferior otic canal and she also has some wax. There is no impaction. The left tympanic membrane is markedly erythematous. Nares are dry but not particularly inflamed. She has some minor rales at the right base. These partially clear with deep inspiration.  General appearance:Adequately nourished; no acute distress or increased work of breathing is present.  BMI 37.79  Lymphatic: No  lymphadenopathy about the head, neck, or axilla .  Eyes: No conjunctival inflammation or lid edema is present. There is no scleral icterus.  Ears:  External ear exam shows no significant lesions or deformities.    Nose:  External nasal examination shows no deformity or inflammation. Nasal mucosa are pink and moist without lesions or exudates No septal dislocation or deviation.No obstruction to airflow.   Oral exam: Dental hygiene is good; lips and gums are healthy appearing.There is no  oropharyngeal erythema or exudate .  Neck:  No deformities, thyromegaly, masses, or tenderness noted.     Heart:  Normal rate and regular rhythm. S1 and S2 normal without gallop, murmur, click, rub or other extra sounds.   Extremities:  No cyanosis, edema, or clubbing  noted    Skin: Warm & dry w/o tenting or jaundice. No significant lesions or rash.      Assessment & Plan:  #1 acute bronchitis  #2 acute frontal sinusitis  #3 otitis media, left  Plan: See orders and recommendations

## 2015-06-30 ENCOUNTER — Telehealth: Payer: Self-pay | Admitting: Internal Medicine

## 2015-06-30 ENCOUNTER — Encounter: Payer: Self-pay | Admitting: Internal Medicine

## 2015-06-30 ENCOUNTER — Ambulatory Visit (HOSPITAL_COMMUNITY)
Admission: RE | Admit: 2015-06-30 | Discharge: 2015-06-30 | Disposition: A | Discharge status: Home / Self Care | Payer: Medicare Other | Source: Ambulatory Visit | Attending: Internal Medicine | Admitting: Internal Medicine

## 2015-06-30 ENCOUNTER — Ambulatory Visit (INDEPENDENT_AMBULATORY_CARE_PROVIDER_SITE_OTHER): Payer: Medicare Other | Admitting: Internal Medicine

## 2015-06-30 ENCOUNTER — Observation Stay (HOSPITAL_COMMUNITY)
Admission: RE | Admit: 2015-06-30 | Discharge: 2015-06-30 | Disposition: A | Discharge status: Home / Self Care | Payer: Medicare Other | Source: Ambulatory Visit | Attending: Internal Medicine | Admitting: Internal Medicine

## 2015-06-30 VITALS — BP 116/76 | HR 65 | Temp 97.9°F

## 2015-06-30 DIAGNOSIS — J209 Acute bronchitis, unspecified: Secondary | ICD-10-CM | POA: Diagnosis not present

## 2015-06-30 DIAGNOSIS — R062 Wheezing: Secondary | ICD-10-CM | POA: Diagnosis not present

## 2015-06-30 DIAGNOSIS — R05 Cough: Secondary | ICD-10-CM | POA: Insufficient documentation

## 2015-06-30 DIAGNOSIS — M25531 Pain in right wrist: Secondary | ICD-10-CM | POA: Diagnosis not present

## 2015-06-30 DIAGNOSIS — S60211A Contusion of right wrist, initial encounter: Secondary | ICD-10-CM | POA: Diagnosis not present

## 2015-06-30 DIAGNOSIS — Z950 Presence of cardiac pacemaker: Secondary | ICD-10-CM | POA: Diagnosis not present

## 2015-06-30 DIAGNOSIS — I509 Heart failure, unspecified: Secondary | ICD-10-CM | POA: Insufficient documentation

## 2015-06-30 DIAGNOSIS — I4891 Unspecified atrial fibrillation: Secondary | ICD-10-CM | POA: Diagnosis not present

## 2015-06-30 DIAGNOSIS — I6350 Cerebral infarction due to unspecified occlusion or stenosis of unspecified cerebral artery: Secondary | ICD-10-CM | POA: Diagnosis not present

## 2015-06-30 NOTE — Progress Notes (Signed)
Subjective:    Patient ID: Kayla Harrison, female    DOB: [birth year], 69 y.o.   MRN: [id number]  HPI She fell yesterday when she took the garbage out.  She tripped on a wooden edge.  She landed on her right orbit/nose and right wrist.  She has swelling, bruising and pain in both areas.  She denies any changes in her vision or eye pain, except the orbit when she touches the area.  She does not feel that the orbit is broken.  She is concerned about her wrist.  The lateral aspect of the wrist is swollen, bruised and very tender.  She has pain when she moves the thumb.  She has decreased range of motion of the thumb.  She denies numbness/tingling in the hand.    Cold symptoms;  She was recently seen for cold symptoms and was prescribed augmentin.  She is concerned that she is not better after three days.  She has a productive cough with yellow phelgm and there has been some blood.  She has some wheezing and sob. She denies fevers.  She is concerned about pneumonia.  She is taking the cough syrup.  She does not like inhalers.    Medications and allergies reviewed with patient and updated if appropriate.  Patient Active Problem List   Diagnosis Date Noted  . OSA (obstructive sleep apnea) 02/03/2015  . History of renal calculi 11/16/2014  . Renal calculi 11/16/2014  . Family history of colon cancer 10/26/2014  . Encounter for therapeutic drug monitoring 11/24/2013  . Mechanical complication due to cardiac pacemaker pulse generator 11/06/2013  . Dyspnea 05/12/2013  . Unspecified hypothyroidism 04/21/2013  . Pleural effusion 04/19/2013  . Long term (current) use of anticoagulants 12/20/2010  . EPIDERMOID CYST 08/22/2010  . CHRONIC DIASTOLIC HEART FAILURE 81/85/6314  . SYNCOPE AND COLLAPSE 07/27/2010  . OSTEOARTHRITIS, KNEE, RIGHT 03/15/2010  . Morbid obesity ([place]) 12/07/2009  . NONSPEC ELEVATION OF LEVELS OF TRANSAMINASE/LDH 09/08/2009  . CVA 08/14/2009  . BREAST CANCER, HX OF  07/25/2009  . COLONIC POLYPS, HX OF 07/25/2009  . TUBULOVILLOUS ADENOMA, COLON 04/29/2008  . HYPERGLYCEMIA, FASTING 04/29/2008  . HYPERLIPIDEMIA 06/22/2007  . Atrial fibrillation ([place]) 06/22/2007  . CARCINOMA, THYROID GLAND, HX OF 06/22/2007    Past Medical History  Diagnosis Date  . Morbid obesity ([place])     Status post lap band surgery  . Hx of thyroid cancer     Dr Forde Dandy  . Colon polyp     Dr Earlean Shawl  . Dyslipidemia   . Hyperlipidemia   . Hypothyroidism   . Breast cancer ([place])     Dr Margot Chimes, total thyroidectomy- 1999- for cancer  . Pneumonia 2010    [facility]- during that event had to be cardioverted   . Arthritis     osteoarthritis - knees   . Normal cardiac stress test     2008- cardiac stress/echo  . Stroke [facility])     2003- [place]   . Atrial fibrillation [facility])     ablation- 2x's-- 1st time- [facility], 2nd event at [facility] in 2008. Convergent ablation at [facility] 6/14  . Complication of anesthesia     Ketamine produces LSD reaction, bright colored nightmarish experience   . Hepatitis     [place]patitis   . Sinus node dysfunction ([place])     Complicating convergent ablation 6/14  . Pacemaker-Medtronic   . Renal calculi   . Dysrhythmia  a fib with internal/external ablation, currently NSR    Past Surgical History  Procedure Laterality Date  . Thyroidectomy  1998    Dr Margot Chimes  . Bso  1998  . Cholecystectomy    . Tonsillectomy    . Colonoscopy w/ polypectomy      Dr Earlean Shawl  . Afib ablation      X 2; [facility] & Dr Caryl Comes  . Abdominal hysterectomy  1983  . Breast lumpectomy  2010  . Laparoscopic gastric banding  07/10/2010  . Appendectomy    . Knee arthroscopy      bilateral  . Laparotomy      for ruptured ovary and ovarian artery   . Total knee arthroplasty  04/13/2012    Procedure: TOTAL KNEE ARTHROPLASTY;  Surgeon: Rudean Haskell, MD;  Location: [place];  Service: Orthopedics;  Laterality: Right;  . Joint replacement  04/13/12     Right Knee replacement  . Cardioversion  10/09/2012    Procedure: CARDIOVERSION;  Surgeon: Minus Breeding, MD;  Location: [place];  Service: Cardiovascular;  Laterality: N/A;  . Cardioversion  10/09/2012    Procedure: CARDIOVERSION;  Surgeon: Minus Breeding, MD;  Location[facility];  Service: Cardiovascular;  Laterality: N/A;  [name] gave the ok to add pt to the add on , but we must check to find out if the can add pt on at [phone] ( [phone])  . Cardioversion N/A 11/20/2012    Procedure: CARDIOVERSION;  Surgeon: Fay Records, MD;  Location: [facility] ENDOSCOPY;  Service: Cardiovascular;  Laterality: N/A;  . Transabdominal ablation  03/02/13    [facility]  . Pacemaker insertion  03/10/13    [facility]  . Convergent    . Pocket revision N/A 12/08/2013    Procedure: POCKET REVISION;  Surgeon: Deboraha Sprang, MD;  Location: [facility] CATH LAB;  Service: Cardiovascular;  Laterality: N/A;  . Eye surgery      cataract surgery  . Cystoscopy with retrograde pyelogram, ureteroscopy and stent placement Right 02/06/2015    Procedure: RETROGRADE PYELOGRAM, RIGHT URETEROSCOPY STENT PLACEMENT;  Surgeon: Kathie Rhodes, MD;  Location: [facility] ORS;  Service: Urology;  Laterality: Right;  . Holmium laser application N/A 09/28/2692    Procedure: HOLMIUM LASER APPLICATION;  Surgeon: Kathie Rhodes, MD;  Location: [facility];  Service: Urology;  Laterality: N/A;  . Cystoscopy N/A 02/06/2015    Procedure: CYSTOSCOPY;  Surgeon: Kathie Rhodes, MD;  Location: [facility];  Service: Urology;  Laterality: N/A;    Social History   Social History  . Marital Status: Single    Spouse Name: N/A  . Number of Children: 0  . Years of Education: N/A   Occupational History  . [employer]    Social History Main Topics  . Smoking status: Never Smoker   . Smokeless tobacco: Never Used  . Alcohol Use: No  . Drug Use: No  . Sexual Activity: Not Asked   Other Topics Concern  . None   Social History Narrative   SINGLE    NO CHILDREN    NEVER SMOKED   EXERCISE 3 X WK          Review of Systems  Constitutional: Negative for fever and chills.  HENT: Positive for congestion. Negative for ear pain and sore throat.   Eyes: Negative for visual disturbance.  Respiratory: Positive for cough (productive with blood), shortness of breath and wheezing.   Cardiovascular: Negative for chest pain.  Neurological: Positive for dizziness and light-headedness. Negative  for headaches.       Objective:   Filed Vitals:   06/30/15 1337  BP: 116/76  Pulse: 65  Temp: 97.9 F (36.6 C)   There were no vitals filed for this visit. There is no weight on file to calculate BMI.   Physical Exam  Constitutional: She appears well-developed and well-nourished. No distress.  HENT:  Right Ear: External ear normal.  Left Ear: External ear normal.  Mouth/Throat: Oropharynx is clear and moist.  Bruising and swelling later upper right orbit, tender with palpation  Eyes: Conjunctivae are normal.  Neck: No tracheal deviation present.  Cardiovascular: Normal rate, regular rhythm and normal heart sounds.   No murmur heard. Pulmonary/Chest: Effort normal. No respiratory distress. She has wheezes (expiratory r>l).  Musculoskeletal:  Right wrist with swelling and bruising especially on lateral aspect.  Pain with movement of thumb and palpation.  Normal sensation in hand.  Lymphadenopathy:    She has no cervical adenopathy.        Assessment & Plan:   Right wrist pain after fall Need to rule out fracture - will send her for an xray today Will need to see ortho if she has a fracture Has pain medication at home  Right orbit pain/ facial trauma Pain with palpation, does not seem to be fractured No concerning eye symptoms Will hold off on xray   Bronchitis She has a productive cough and wheeze Has only been on augmentin three days and it is too early to know if it is working and I would rather not switch the antibiotic Will check a  cxr She declined an inhaler  Discussed fall prevention.  She has fallen twice in the past year.

## 2015-06-30 NOTE — Telephone Encounter (Signed)
Pt aware.

## 2015-06-30 NOTE — Patient Instructions (Addendum)
Xrays were ordered for your wrist and chest.  We will call you with those results.   If your cold symptoms do not improve, please call me on Monday.

## 2015-06-30 NOTE — Progress Notes (Signed)
Pre visit review using our clinic review tool, if applicable. No additional management support is needed unless otherwise documented below in the visit note. 

## 2015-06-30 NOTE — Telephone Encounter (Signed)
No answer, no voicemail.

## 2015-06-30 NOTE — Telephone Encounter (Signed)
Let her know both xrays are normal.  No evidence of pneumonia on the chest xray and no wrist fracture.

## 2015-06-30 NOTE — Telephone Encounter (Signed)
Pt was seen today 10/7

## 2015-07-04 ENCOUNTER — Ambulatory Visit (INDEPENDENT_AMBULATORY_CARE_PROVIDER_SITE_OTHER): Payer: Medicare Other | Admitting: *Deleted

## 2015-07-04 DIAGNOSIS — Z7901 Long term (current) use of anticoagulants: Secondary | ICD-10-CM

## 2015-07-04 DIAGNOSIS — I6350 Cerebral infarction due to unspecified occlusion or stenosis of unspecified cerebral artery: Secondary | ICD-10-CM | POA: Diagnosis not present

## 2015-07-04 DIAGNOSIS — M170 Bilateral primary osteoarthritis of knee: Secondary | ICD-10-CM | POA: Diagnosis not present

## 2015-07-04 DIAGNOSIS — Z5181 Encounter for therapeutic drug level monitoring: Secondary | ICD-10-CM

## 2015-07-04 DIAGNOSIS — M545 Low back pain: Secondary | ICD-10-CM | POA: Diagnosis not present

## 2015-07-04 DIAGNOSIS — M25561 Pain in right knee: Secondary | ICD-10-CM | POA: Diagnosis not present

## 2015-07-04 DIAGNOSIS — M25562 Pain in left knee: Secondary | ICD-10-CM | POA: Diagnosis not present

## 2015-07-04 DIAGNOSIS — M5136 Other intervertebral disc degeneration, lumbar region: Secondary | ICD-10-CM | POA: Diagnosis not present

## 2015-07-04 DIAGNOSIS — M5442 Lumbago with sciatica, left side: Secondary | ICD-10-CM | POA: Diagnosis not present

## 2015-07-04 DIAGNOSIS — Z471 Aftercare following joint replacement surgery: Secondary | ICD-10-CM | POA: Diagnosis not present

## 2015-07-04 DIAGNOSIS — I4891 Unspecified atrial fibrillation: Secondary | ICD-10-CM | POA: Diagnosis not present

## 2015-07-04 DIAGNOSIS — M5441 Lumbago with sciatica, right side: Secondary | ICD-10-CM | POA: Diagnosis not present

## 2015-07-04 DIAGNOSIS — Z96651 Presence of right artificial knee joint: Secondary | ICD-10-CM | POA: Diagnosis not present

## 2015-07-04 LAB — POCT INR: INR: 2.1

## 2015-07-06 DIAGNOSIS — M79604 Pain in right leg: Secondary | ICD-10-CM | POA: Diagnosis not present

## 2015-07-06 DIAGNOSIS — M25461 Effusion, right knee: Secondary | ICD-10-CM | POA: Diagnosis not present

## 2015-07-06 DIAGNOSIS — R262 Difficulty in walking, not elsewhere classified: Secondary | ICD-10-CM | POA: Diagnosis not present

## 2015-07-07 ENCOUNTER — Telehealth: Payer: Self-pay

## 2015-07-07 NOTE — Telephone Encounter (Signed)
Call to introduce AWV / stated that she is "on top" of her health. 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 STates her shingles is to costly at present; states she checked with part D and she is nearing the doughnut and cost is over 200.00; Also discussed dexa and medicare coverage of screenings; will fup with insurer to confirm no copay.  Thanked her for her time, Will defer AWV with Dr. Linna Darner

## 2015-07-13 DIAGNOSIS — M79604 Pain in right leg: Secondary | ICD-10-CM | POA: Diagnosis not present

## 2015-07-13 DIAGNOSIS — M5432 Sciatica, left side: Secondary | ICD-10-CM | POA: Diagnosis not present

## 2015-07-17 ENCOUNTER — Ambulatory Visit (INDEPENDENT_AMBULATORY_CARE_PROVIDER_SITE_OTHER): Payer: Medicare Other | Admitting: *Deleted

## 2015-07-17 DIAGNOSIS — I495 Sick sinus syndrome: Secondary | ICD-10-CM | POA: Diagnosis not present

## 2015-07-17 DIAGNOSIS — M79604 Pain in right leg: Secondary | ICD-10-CM | POA: Diagnosis not present

## 2015-07-17 DIAGNOSIS — M5432 Sciatica, left side: Secondary | ICD-10-CM | POA: Diagnosis not present

## 2015-07-17 DIAGNOSIS — M25461 Effusion, right knee: Secondary | ICD-10-CM | POA: Diagnosis not present

## 2015-07-17 DIAGNOSIS — R262 Difficulty in walking, not elsewhere classified: Secondary | ICD-10-CM | POA: Diagnosis not present

## 2015-07-18 NOTE — Progress Notes (Signed)
Remote pacemaker transmission.   

## 2015-07-20 DIAGNOSIS — M79604 Pain in right leg: Secondary | ICD-10-CM | POA: Diagnosis not present

## 2015-07-20 DIAGNOSIS — R262 Difficulty in walking, not elsewhere classified: Secondary | ICD-10-CM | POA: Diagnosis not present

## 2015-07-20 DIAGNOSIS — M5432 Sciatica, left side: Secondary | ICD-10-CM | POA: Diagnosis not present

## 2015-07-26 DIAGNOSIS — M79604 Pain in right leg: Secondary | ICD-10-CM | POA: Diagnosis not present

## 2015-07-26 DIAGNOSIS — M5432 Sciatica, left side: Secondary | ICD-10-CM | POA: Diagnosis not present

## 2015-07-26 DIAGNOSIS — M25461 Effusion, right knee: Secondary | ICD-10-CM | POA: Diagnosis not present

## 2015-07-26 DIAGNOSIS — R262 Difficulty in walking, not elsewhere classified: Secondary | ICD-10-CM | POA: Diagnosis not present

## 2015-07-27 ENCOUNTER — Telehealth (HOSPITAL_COMMUNITY): Payer: Self-pay

## 2015-07-27 NOTE — Telephone Encounter (Signed)
This patient is overdue for recommended follow-up with a bariatric surgeon at [facility].   A letter was mailed to the patient on 10.10.16 to the address on file from [place] in attempt to re-establish care & advising the patient on the benefits of follow-up care and directing them to call [facility] at [phone] to schedule an appointment at their earliest convenience. The letter included a patient survey which was returned to the [facility] yesterday. Patient willing to schedule an appointment with [facility] & requested a call to schedule. Forwarded survey to [facility] advising them to contact the patient to schedule based on mutual availability.    Kayla Harrison. [facility] Bariatric Office Coordinator [phone]

## 2015-08-02 ENCOUNTER — Other Ambulatory Visit: Payer: Self-pay | Admitting: Internal Medicine

## 2015-08-02 DIAGNOSIS — Z853 Personal history of malignant neoplasm of breast: Secondary | ICD-10-CM

## 2015-08-08 DIAGNOSIS — M5442 Lumbago with sciatica, left side: Secondary | ICD-10-CM | POA: Diagnosis not present

## 2015-08-08 DIAGNOSIS — M25531 Pain in right wrist: Secondary | ICD-10-CM | POA: Diagnosis not present

## 2015-08-09 DIAGNOSIS — R262 Difficulty in walking, not elsewhere classified: Secondary | ICD-10-CM | POA: Diagnosis not present

## 2015-08-09 DIAGNOSIS — M79604 Pain in right leg: Secondary | ICD-10-CM | POA: Diagnosis not present

## 2015-08-09 DIAGNOSIS — M25461 Effusion, right knee: Secondary | ICD-10-CM | POA: Diagnosis not present

## 2015-08-09 DIAGNOSIS — M5432 Sciatica, left side: Secondary | ICD-10-CM | POA: Diagnosis not present

## 2015-08-10 ENCOUNTER — Ambulatory Visit
Admission: RE | Admit: 2015-08-10 | Discharge: 2015-08-10 | Disposition: A | Discharge status: Home / Self Care | Payer: Medicare Other | Source: Ambulatory Visit | Attending: Internal Medicine | Admitting: Internal Medicine

## 2015-08-10 DIAGNOSIS — Z853 Personal history of malignant neoplasm of breast: Secondary | ICD-10-CM

## 2015-08-10 DIAGNOSIS — R928 Other abnormal and inconclusive findings on diagnostic imaging of breast: Secondary | ICD-10-CM | POA: Diagnosis not present

## 2015-08-11 DIAGNOSIS — E668 Other obesity: Secondary | ICD-10-CM | POA: Diagnosis not present

## 2015-08-11 DIAGNOSIS — N200 Calculus of kidney: Secondary | ICD-10-CM | POA: Diagnosis not present

## 2015-08-11 DIAGNOSIS — C50911 Malignant neoplasm of unspecified site of right female breast: Secondary | ICD-10-CM | POA: Diagnosis not present

## 2015-08-11 DIAGNOSIS — Z6838 Body mass index (BMI) 38.0-38.9, adult: Secondary | ICD-10-CM | POA: Diagnosis not present

## 2015-08-11 DIAGNOSIS — Z1389 Encounter for screening for other disorder: Secondary | ICD-10-CM | POA: Diagnosis not present

## 2015-08-11 DIAGNOSIS — E890 Postprocedural hypothyroidism: Secondary | ICD-10-CM | POA: Diagnosis not present

## 2015-08-11 DIAGNOSIS — C73 Malignant neoplasm of thyroid gland: Secondary | ICD-10-CM | POA: Diagnosis not present

## 2015-08-11 DIAGNOSIS — Z7901 Long term (current) use of anticoagulants: Secondary | ICD-10-CM | POA: Diagnosis not present

## 2015-08-11 DIAGNOSIS — R7301 Impaired fasting glucose: Secondary | ICD-10-CM | POA: Diagnosis not present

## 2015-08-11 DIAGNOSIS — I480 Paroxysmal atrial fibrillation: Secondary | ICD-10-CM | POA: Diagnosis not present

## 2015-08-11 DIAGNOSIS — L84 Corns and callosities: Secondary | ICD-10-CM | POA: Diagnosis not present

## 2015-08-11 DIAGNOSIS — E784 Other hyperlipidemia: Secondary | ICD-10-CM | POA: Diagnosis not present

## 2015-08-14 DIAGNOSIS — M79604 Pain in right leg: Secondary | ICD-10-CM | POA: Diagnosis not present

## 2015-08-14 DIAGNOSIS — M25461 Effusion, right knee: Secondary | ICD-10-CM | POA: Diagnosis not present

## 2015-08-14 DIAGNOSIS — M5432 Sciatica, left side: Secondary | ICD-10-CM | POA: Diagnosis not present

## 2015-08-14 DIAGNOSIS — R262 Difficulty in walking, not elsewhere classified: Secondary | ICD-10-CM | POA: Diagnosis not present

## 2015-08-15 ENCOUNTER — Ambulatory Visit (INDEPENDENT_AMBULATORY_CARE_PROVIDER_SITE_OTHER): Payer: Medicare Other | Admitting: *Deleted

## 2015-08-15 DIAGNOSIS — I4891 Unspecified atrial fibrillation: Secondary | ICD-10-CM | POA: Diagnosis not present

## 2015-08-15 DIAGNOSIS — Z5181 Encounter for therapeutic drug level monitoring: Secondary | ICD-10-CM

## 2015-08-15 DIAGNOSIS — I6350 Cerebral infarction due to unspecified occlusion or stenosis of unspecified cerebral artery: Secondary | ICD-10-CM

## 2015-08-15 DIAGNOSIS — Z7901 Long term (current) use of anticoagulants: Secondary | ICD-10-CM | POA: Diagnosis not present

## 2015-08-15 LAB — POCT INR: INR: 2.3

## 2015-08-21 DIAGNOSIS — N390 Urinary tract infection, site not specified: Secondary | ICD-10-CM | POA: Diagnosis not present

## 2015-08-22 ENCOUNTER — Ambulatory Visit: Payer: Medicare Other | Admitting: Internal Medicine

## 2015-08-22 DIAGNOSIS — M5432 Sciatica, left side: Secondary | ICD-10-CM | POA: Diagnosis not present

## 2015-08-22 DIAGNOSIS — M79604 Pain in right leg: Secondary | ICD-10-CM | POA: Diagnosis not present

## 2015-08-22 DIAGNOSIS — R262 Difficulty in walking, not elsewhere classified: Secondary | ICD-10-CM | POA: Diagnosis not present

## 2015-09-05 DIAGNOSIS — M79604 Pain in right leg: Secondary | ICD-10-CM | POA: Diagnosis not present

## 2015-09-05 DIAGNOSIS — M5442 Lumbago with sciatica, left side: Secondary | ICD-10-CM | POA: Diagnosis not present

## 2015-09-05 DIAGNOSIS — G8929 Other chronic pain: Secondary | ICD-10-CM | POA: Diagnosis not present

## 2015-09-05 DIAGNOSIS — R262 Difficulty in walking, not elsewhere classified: Secondary | ICD-10-CM | POA: Diagnosis not present

## 2015-09-26 ENCOUNTER — Ambulatory Visit (INDEPENDENT_AMBULATORY_CARE_PROVIDER_SITE_OTHER): Payer: Medicare Other | Admitting: Pharmacist

## 2015-09-26 DIAGNOSIS — I6350 Cerebral infarction due to unspecified occlusion or stenosis of unspecified cerebral artery: Secondary | ICD-10-CM | POA: Diagnosis not present

## 2015-09-26 DIAGNOSIS — I4891 Unspecified atrial fibrillation: Secondary | ICD-10-CM

## 2015-09-26 DIAGNOSIS — Z5181 Encounter for therapeutic drug level monitoring: Secondary | ICD-10-CM

## 2015-09-26 DIAGNOSIS — Z7901 Long term (current) use of anticoagulants: Secondary | ICD-10-CM | POA: Diagnosis not present

## 2015-09-26 LAB — POCT INR: INR: 2

## 2015-10-11 DIAGNOSIS — M71571 Other bursitis, not elsewhere classified, right ankle and foot: Secondary | ICD-10-CM | POA: Diagnosis not present

## 2015-10-11 DIAGNOSIS — M7731 Calcaneal spur, right foot: Secondary | ICD-10-CM | POA: Diagnosis not present

## 2015-10-11 DIAGNOSIS — L03032 Cellulitis of left toe: Secondary | ICD-10-CM | POA: Diagnosis not present

## 2015-10-11 DIAGNOSIS — M722 Plantar fascial fibromatosis: Secondary | ICD-10-CM | POA: Diagnosis not present

## 2015-10-16 ENCOUNTER — Ambulatory Visit: Payer: Medicare Other | Admitting: *Deleted

## 2015-10-19 DIAGNOSIS — M25775 Osteophyte, left foot: Secondary | ICD-10-CM | POA: Diagnosis not present

## 2015-10-19 DIAGNOSIS — L03032 Cellulitis of left toe: Secondary | ICD-10-CM | POA: Diagnosis not present

## 2015-10-22 ENCOUNTER — Other Ambulatory Visit: Payer: Self-pay | Admitting: Internal Medicine

## 2015-10-22 ENCOUNTER — Ambulatory Visit (INDEPENDENT_AMBULATORY_CARE_PROVIDER_SITE_OTHER): Payer: Medicare Other

## 2015-10-22 ENCOUNTER — Ambulatory Visit (INDEPENDENT_AMBULATORY_CARE_PROVIDER_SITE_OTHER): Payer: Medicare Other | Admitting: Internal Medicine

## 2015-10-22 VITALS — BP 126/80 | HR 85 | Temp 97.5°F | Resp 20 | Ht 66.0 in | Wt 222.0 lb

## 2015-10-22 DIAGNOSIS — Z23 Encounter for immunization: Secondary | ICD-10-CM

## 2015-10-22 DIAGNOSIS — W5501XA Bitten by cat, initial encounter: Secondary | ICD-10-CM

## 2015-10-22 DIAGNOSIS — M25532 Pain in left wrist: Secondary | ICD-10-CM | POA: Diagnosis not present

## 2015-10-22 DIAGNOSIS — M79642 Pain in left hand: Secondary | ICD-10-CM

## 2015-10-22 DIAGNOSIS — S61452A Open bite of left hand, initial encounter: Secondary | ICD-10-CM | POA: Diagnosis not present

## 2015-10-22 DIAGNOSIS — M7989 Other specified soft tissue disorders: Secondary | ICD-10-CM | POA: Diagnosis not present

## 2015-10-22 MED ORDER — AMOXICILLIN-POT CLAVULANATE 875-125 MG PO TABS
ORAL_TABLET | ORAL | Status: DC
Start: 2015-10-22 — End: 2016-01-16

## 2015-10-22 MED ORDER — HYDROCODONE-ACETAMINOPHEN 10-325 MG PO TABS: 1.0000 | ORAL_TABLET | Freq: Four times a day (QID) | ORAL | Status: DC | PRN

## 2015-10-22 NOTE — Patient Instructions (Signed)
Because you received an x-ray today, you will receive an invoice from [facility]. Please contact [facility] at [phone] with questions or concerns regarding your invoice. Our billing staff will not be able to assist you with those questions.

## 2015-10-22 NOTE — Progress Notes (Signed)
Subjective:    Patient ID: Kayla Harrison, female    DOB: 1946-06-20, 70 y.o.   MRN: [id number] By signing my name below, I, Judithe Modest, attest that this documentation has been prepared under the direction and in the presence of Tami Lin, MD. Electronically Signed: Judithe Modest, ER Scribe. 10/22/2015. 4:51 PM.  Chief Complaint  Patient presents with  . Animal Bite    cat   HPI HPI Comments: Kayla Harrison is a 70 y.o. female who presents to [facility] complaining of a cat bite with severe swelling and redness today. She was petting her neighbors cat and sustained a bite  on the left hand She has had no new swelling over the last 6 hours but has had severe limitation in her ROM due to pain. She denies fever or chills.  Last tetanus9years ago  She also has history of injury to the left wrist, suspected navicular fracture 3-4 months ago, and still has significant pain with certain movements. She was advised re-x-ray this never happened.  Patient Active Problem List   Diagnosis Date Noted  . OSA (obstructive sleep apnea) 02/03/2015  . History of renal calculi 11/16/2014  . Renal calculi 11/16/2014  . Family history of colon cancer 10/26/2014  . Encounter for therapeutic drug monitoring 11/24/2013  . Mechanical complication due to cardiac pacemaker pulse generator 11/06/2013  . Dyspnea 05/12/2013  . Unspecified hypothyroidism 04/21/2013  . Pleural effusion 04/19/2013  . Long term (current) use of anticoagulants 12/20/2010  . EPIDERMOID CYST 08/22/2010  . CHRONIC DIASTOLIC HEART FAILURE 36/64/4034  . SYNCOPE AND COLLAPSE 07/27/2010  . OSTEOARTHRITIS, KNEE, RIGHT 03/15/2010  . Morbid obesity ([place]) 12/07/2009  . NONSPEC ELEVATION OF LEVELS OF TRANSAMINASE/LDH 09/08/2009  . CVA 08/14/2009  . BREAST CANCER, HX OF 07/25/2009  . COLONIC POLYPS, HX OF 07/25/2009  . TUBULOVILLOUS ADENOMA, COLON 04/29/2008  . HYPERGLYCEMIA, FASTING 04/29/2008  . HYPERLIPIDEMIA  06/22/2007  . Atrial fibrillation ([place]) 06/22/2007  . CARCINOMA, THYROID GLAND, HX OF 06/22/2007    Allergies  Allergen Reactions  . Tikosyn [Dofetilide]     Prolonged QT interval Prolonged QT interval  . Xarelto [Rivaroxaban]     Nose Bleed X 6 hrs ; packing in ER  . Epinephrine Other (See Comments) and Rash    Dental form only (liquid). Patient stated she will become "out of it" she can hear you but cannot respond in normal fashion.  Marland Kitchen Ketamine     Hallucinations  . Eliquis [Apixaban] Other (See Comments)    BLEEDING IN URINE   Current Outpatient Prescriptions on File Prior to Visit  Medication Sig Dispense Refill  . amiodarone (PACERONE) 200 MG tablet Take 0.5 tablets (100 mg total) by mouth daily. 45 tablet 2  . amoxicillin-clavulanate (AUGMENTIN) 875-125 MG tablet Take 1 tablet by mouth 2 (two) times daily. 20 tablet 0  . cycloSPORINE (RESTASIS) 0.05 % ophthalmic emulsion Place 1 drop into both eyes 2 (two) times daily.    . furosemide (LASIX) 40 MG tablet TAKE 1 TABLET BY MOUTH EVERY DAY AS DIRECTED (Patient taking differently: TAKE 1 TABLET BY MOUTH EVERY DAY AS NEEDED FOR FLUID) 30 tablet 1  . HYDROcodone-homatropine (HYDROMET) 5-1.5 MG/5ML syrup Take 5 mLs by mouth every 6 (six) hours as needed for cough. 120 mL 0  . levothyroxine (SYNTHROID, LEVOTHROID) 175 MCG tablet Take 175 mcg by mouth daily before breakfast. TAKE 1 TABLET BY MOUTH EVERY DAY EXCEPT ON SAT SHE DOES NOT TAKE AT  ALL    . Multiple Vitamin (MULTIVITAMIN) tablet Take 2 tablets by mouth daily.     Marland Kitchen TAURINE PO Take 1 tablet by mouth daily.     Marland Kitchen warfarin (COUMADIN) 5 MG tablet Take as directed by coumadin  clinic 90 tablet 1  . pravastatin (PRAVACHOL) 40 MG tablet TAKE 1 TABLET (40 MG TOTAL) BY MOUTH DAILY. (Patient not taking: Reported on 10/22/2015) 90 tablet 0   No current facility-administered medications on file prior to visit.   Review of Systems  Constitutional: Negative for fever and chills.  Skin:  Positive for color change and wound.  Neurological: Positive for weakness (secondary to pain). Negative for numbness.      Objective:  BP 126/80 mmHg  Pulse 85  Temp(Src) 97.5 F (36.4 C) (Oral)  Resp 20  Ht '5\' 6"'$  (1.676 m)  Wt 222 lb (100.699 kg)  BMI 35.85 kg/m2  SpO2 98%  Physical Exam  Constitutional: She is oriented to person, place, and time. She appears well-developed and well-nourished. No distress.  HENT:  Head: Normocephalic and atraumatic.  Eyes: Pupils are equal, round, and reactive to light.  Neck: Neck supple.  Cardiovascular: Normal rate.   Pulmonary/Chest: Effort normal. No respiratory distress.  Musculoskeletal: Normal range of motion.  The left hand has significant dorsal swelling especially over metacarpals 2 and 3 which are very tender to palpation There are multiple puncture wounds more distally on the radial aspect of the dorsum of the hand and also bite marks on the dorsal forearm but little swelling there She can make a fist but grip is very uncomfortable Extensor tendon movement is uncomfortable Snuffboxes tender and not swollen There is no red streaking from the hand into the arm There is no palmar swelling ecchymosis or erythema  Neurological: She is alert and oriented to person, place, and time. Coordination normal.  Skin: Skin is warm and dry. She is not diaphoretic.  Psychiatric: She has a normal mood and affect. Her behavior is normal.  Nursing note and vitals reviewed.    x-rays of the hand and wrist reveal no bony abnormalities    Assessment & Plan:  Pain of left hand -secondary to infection and swelling  Cat bite of hand, left, initial encounter  Wrist pain, acute and chronic, left -no osseous abnormality on x-ray -Discussed PT and possible hand orthopedic follow-up  Ice, elevation Soap water scrub Start Augmentin Td Meds ordered this encounter  Medications  . amoxicillin-clavulanate (AUGMENTIN) 875-125 MG tablet    Sig: 2 now,  then 2 in am then 1 every 12 hours    Dispense:  22 tablet    Refill:  0  . HYDROcodone-acetaminophen (NORCO) 10-325 MG tablet    Sig: Take 1 tablet by mouth every 6 (six) hours as needed.    Dispense:  30 tablet    Refill:  0   Very close follow-up over the next 12 hours and if any more swelling, return to clinic immediately for emergency hand orthopedic consult  I have completed the patient encounter in its entirety as documented by the scribe, with editing by me where necessary. Rodneisha Bonnet P. Laney Pastor, M.D.

## 2015-10-23 ENCOUNTER — Telehealth: Payer: Self-pay

## 2015-10-23 NOTE — Telephone Encounter (Signed)
Dr. Laney Pastor  Please see previous message

## 2015-10-23 NOTE — Telephone Encounter (Signed)
Pt was told that if any changes to the cat bite to let the office know and she states that he hand is swollen   Best number [phone]

## 2015-10-26 ENCOUNTER — Ambulatory Visit (INDEPENDENT_AMBULATORY_CARE_PROVIDER_SITE_OTHER): Payer: Medicare Other | Admitting: Family Medicine

## 2015-10-26 VITALS — BP 130/70 | HR 78 | Temp 98.0°F | Resp 18 | Ht 66.0 in | Wt 222.0 lb

## 2015-10-26 DIAGNOSIS — S61451D Open bite of right hand, subsequent encounter: Secondary | ICD-10-CM | POA: Diagnosis not present

## 2015-10-26 DIAGNOSIS — S81851A Open bite, right lower leg, initial encounter: Secondary | ICD-10-CM | POA: Diagnosis not present

## 2015-10-26 DIAGNOSIS — I6350 Cerebral infarction due to unspecified occlusion or stenosis of unspecified cerebral artery: Secondary | ICD-10-CM

## 2015-10-26 DIAGNOSIS — W5501XA Bitten by cat, initial encounter: Secondary | ICD-10-CM | POA: Diagnosis not present

## 2015-10-26 DIAGNOSIS — W5501XD Bitten by cat, subsequent encounter: Secondary | ICD-10-CM | POA: Diagnosis not present

## 2015-10-26 DIAGNOSIS — L03113 Cellulitis of right upper limb: Secondary | ICD-10-CM

## 2015-10-26 MED ORDER — CEFTRIAXONE SODIUM 1 G IJ SOLR
1.0000 g | Freq: Once | INTRAMUSCULAR | Status: AC
  Administered 2015-10-26: 1 g via INTRAMUSCULAR

## 2015-10-26 NOTE — Progress Notes (Addendum)
   Subjective:    Patient ID: Kayla Harrison, female    DOB: 29-Nov-1945, 70 y.o.   MRN: [id number] By signing my name below, I, Kayla Harrison, attest that this documentation has been prepared under the direction and in the presence of Robyn Haber, MD.  Electronically Signed: Zola Harrison, Medical Scribe. 10/26/2015. 12:49 PM.  HPI HPI Comments: Kayla Harrison is a 70 y.o. female with a history of A Fib who presents to the [facility] for a follow-up for a cat bite to her right hand 4 days ago. Patient states her hand has improved some, but she still has pain, swelling and erythema to the area. She has also noticed pus drainage from the wound yesterday. She was given amoxicillin and a tetanus shot when she was seen here 4 days ago by Dr. Laney Pastor. Patient denies pain into her arm.  Patient was also clawed by her neighbor's cat on her right lower leg this morning.  Review of Systems  Skin: Positive for color change and wound.       Objective:   Physical Exam CONSTITUTIONAL: Well developed/well nourished HEAD: Normocephalic/atraumatic EYES: EOM/PERRL ENMT: Mucous membranes moist NECK: supple no meningeal signs SPINE: entire spine nontender CV: S1/S2 noted, no murmurs/rubs/gallops noted LUNGS: Lungs are clear to auscultation bilaterally, no apparent distress ABDOMEN: soft, nontender, no rebound or guarding GU: no cva tenderness NEURO: Pt is awake/alert, moves all extremitiesx4 EXTREMITIES: pulses normal, full ROM of her hand and elbow. SKIN: Right hand swollen and red. No red streaks. Two puncture sites on her right lower leg. The upper puncture wound is slowly draining blood. PSYCH: no abnormalities of mood noted LYMPH: No epitrochlear or axillary adenopathy on the right.      Assessment & Plan:   This chart was scribed in my presence and reviewed by me personally.    ICD-9-CM ICD-10-CM   1. Cellulitis of right upper extremity 682.3 L03.113  cefTRIAXone (ROCEPHIN) injection 1 g  2. Cat bite of hand, right, subsequent encounter V58.89 S61.451D cefTRIAXone (ROCEPHIN) injection 1 g   882.0 W55.01XD   3. Cat bite of lower leg, right, initial encounter 891.0 S81.851A    E906.3 W55.01XA      Signed, Robyn Haber, MD

## 2015-11-07 ENCOUNTER — Ambulatory Visit (INDEPENDENT_AMBULATORY_CARE_PROVIDER_SITE_OTHER): Payer: Medicare Other | Admitting: *Deleted

## 2015-11-07 DIAGNOSIS — I4891 Unspecified atrial fibrillation: Secondary | ICD-10-CM | POA: Diagnosis not present

## 2015-11-07 DIAGNOSIS — Z5181 Encounter for therapeutic drug level monitoring: Secondary | ICD-10-CM | POA: Diagnosis not present

## 2015-11-07 DIAGNOSIS — I6350 Cerebral infarction due to unspecified occlusion or stenosis of unspecified cerebral artery: Secondary | ICD-10-CM

## 2015-11-07 DIAGNOSIS — Z7901 Long term (current) use of anticoagulants: Secondary | ICD-10-CM | POA: Diagnosis not present

## 2015-11-07 LAB — POCT INR: INR: 2.4

## 2015-11-27 DIAGNOSIS — M9902 Segmental and somatic dysfunction of thoracic region: Secondary | ICD-10-CM | POA: Diagnosis not present

## 2015-11-27 DIAGNOSIS — M4724 Other spondylosis with radiculopathy, thoracic region: Secondary | ICD-10-CM | POA: Diagnosis not present

## 2015-11-27 DIAGNOSIS — M9903 Segmental and somatic dysfunction of lumbar region: Secondary | ICD-10-CM | POA: Diagnosis not present

## 2015-11-27 DIAGNOSIS — M4726 Other spondylosis with radiculopathy, lumbar region: Secondary | ICD-10-CM | POA: Diagnosis not present

## 2015-11-28 ENCOUNTER — Other Ambulatory Visit: Payer: Self-pay | Admitting: Internal Medicine

## 2015-11-28 DIAGNOSIS — M9902 Segmental and somatic dysfunction of thoracic region: Secondary | ICD-10-CM | POA: Diagnosis not present

## 2015-11-28 DIAGNOSIS — M9903 Segmental and somatic dysfunction of lumbar region: Secondary | ICD-10-CM | POA: Diagnosis not present

## 2015-11-28 DIAGNOSIS — M4724 Other spondylosis with radiculopathy, thoracic region: Secondary | ICD-10-CM | POA: Diagnosis not present

## 2015-11-28 DIAGNOSIS — M4726 Other spondylosis with radiculopathy, lumbar region: Secondary | ICD-10-CM | POA: Diagnosis not present

## 2015-11-29 DIAGNOSIS — M4726 Other spondylosis with radiculopathy, lumbar region: Secondary | ICD-10-CM | POA: Diagnosis not present

## 2015-11-29 DIAGNOSIS — M9903 Segmental and somatic dysfunction of lumbar region: Secondary | ICD-10-CM | POA: Diagnosis not present

## 2015-11-29 DIAGNOSIS — M9902 Segmental and somatic dysfunction of thoracic region: Secondary | ICD-10-CM | POA: Diagnosis not present

## 2015-11-29 DIAGNOSIS — M4724 Other spondylosis with radiculopathy, thoracic region: Secondary | ICD-10-CM | POA: Diagnosis not present

## 2015-12-04 DIAGNOSIS — M4726 Other spondylosis with radiculopathy, lumbar region: Secondary | ICD-10-CM | POA: Diagnosis not present

## 2015-12-04 DIAGNOSIS — M4724 Other spondylosis with radiculopathy, thoracic region: Secondary | ICD-10-CM | POA: Diagnosis not present

## 2015-12-04 DIAGNOSIS — M9902 Segmental and somatic dysfunction of thoracic region: Secondary | ICD-10-CM | POA: Diagnosis not present

## 2015-12-04 DIAGNOSIS — M9903 Segmental and somatic dysfunction of lumbar region: Secondary | ICD-10-CM | POA: Diagnosis not present

## 2015-12-05 DIAGNOSIS — M9902 Segmental and somatic dysfunction of thoracic region: Secondary | ICD-10-CM | POA: Diagnosis not present

## 2015-12-05 DIAGNOSIS — M9903 Segmental and somatic dysfunction of lumbar region: Secondary | ICD-10-CM | POA: Diagnosis not present

## 2015-12-05 DIAGNOSIS — M4726 Other spondylosis with radiculopathy, lumbar region: Secondary | ICD-10-CM | POA: Diagnosis not present

## 2015-12-05 DIAGNOSIS — M4724 Other spondylosis with radiculopathy, thoracic region: Secondary | ICD-10-CM | POA: Diagnosis not present

## 2015-12-06 DIAGNOSIS — M4726 Other spondylosis with radiculopathy, lumbar region: Secondary | ICD-10-CM | POA: Diagnosis not present

## 2015-12-06 DIAGNOSIS — M4724 Other spondylosis with radiculopathy, thoracic region: Secondary | ICD-10-CM | POA: Diagnosis not present

## 2015-12-06 DIAGNOSIS — M9902 Segmental and somatic dysfunction of thoracic region: Secondary | ICD-10-CM | POA: Diagnosis not present

## 2015-12-06 DIAGNOSIS — M9903 Segmental and somatic dysfunction of lumbar region: Secondary | ICD-10-CM | POA: Diagnosis not present

## 2015-12-11 DIAGNOSIS — M9903 Segmental and somatic dysfunction of lumbar region: Secondary | ICD-10-CM | POA: Diagnosis not present

## 2015-12-11 DIAGNOSIS — M722 Plantar fascial fibromatosis: Secondary | ICD-10-CM | POA: Diagnosis not present

## 2015-12-11 DIAGNOSIS — M9902 Segmental and somatic dysfunction of thoracic region: Secondary | ICD-10-CM | POA: Diagnosis not present

## 2015-12-11 DIAGNOSIS — M65871 Other synovitis and tenosynovitis, right ankle and foot: Secondary | ICD-10-CM | POA: Diagnosis not present

## 2015-12-11 DIAGNOSIS — M4724 Other spondylosis with radiculopathy, thoracic region: Secondary | ICD-10-CM | POA: Diagnosis not present

## 2015-12-11 DIAGNOSIS — M4726 Other spondylosis with radiculopathy, lumbar region: Secondary | ICD-10-CM | POA: Diagnosis not present

## 2015-12-12 DIAGNOSIS — M4724 Other spondylosis with radiculopathy, thoracic region: Secondary | ICD-10-CM | POA: Diagnosis not present

## 2015-12-12 DIAGNOSIS — M9902 Segmental and somatic dysfunction of thoracic region: Secondary | ICD-10-CM | POA: Diagnosis not present

## 2015-12-12 DIAGNOSIS — M9903 Segmental and somatic dysfunction of lumbar region: Secondary | ICD-10-CM | POA: Diagnosis not present

## 2015-12-12 DIAGNOSIS — M4726 Other spondylosis with radiculopathy, lumbar region: Secondary | ICD-10-CM | POA: Diagnosis not present

## 2015-12-13 DIAGNOSIS — M9903 Segmental and somatic dysfunction of lumbar region: Secondary | ICD-10-CM | POA: Diagnosis not present

## 2015-12-13 DIAGNOSIS — M4724 Other spondylosis with radiculopathy, thoracic region: Secondary | ICD-10-CM | POA: Diagnosis not present

## 2015-12-13 DIAGNOSIS — M4726 Other spondylosis with radiculopathy, lumbar region: Secondary | ICD-10-CM | POA: Diagnosis not present

## 2015-12-13 DIAGNOSIS — M9902 Segmental and somatic dysfunction of thoracic region: Secondary | ICD-10-CM | POA: Diagnosis not present

## 2015-12-18 DIAGNOSIS — M4724 Other spondylosis with radiculopathy, thoracic region: Secondary | ICD-10-CM | POA: Diagnosis not present

## 2015-12-18 DIAGNOSIS — M9903 Segmental and somatic dysfunction of lumbar region: Secondary | ICD-10-CM | POA: Diagnosis not present

## 2015-12-18 DIAGNOSIS — M4726 Other spondylosis with radiculopathy, lumbar region: Secondary | ICD-10-CM | POA: Diagnosis not present

## 2015-12-18 DIAGNOSIS — M9902 Segmental and somatic dysfunction of thoracic region: Secondary | ICD-10-CM | POA: Diagnosis not present

## 2015-12-19 ENCOUNTER — Ambulatory Visit (INDEPENDENT_AMBULATORY_CARE_PROVIDER_SITE_OTHER): Payer: Medicare Other

## 2015-12-19 DIAGNOSIS — I6350 Cerebral infarction due to unspecified occlusion or stenosis of unspecified cerebral artery: Secondary | ICD-10-CM | POA: Diagnosis not present

## 2015-12-19 DIAGNOSIS — M4724 Other spondylosis with radiculopathy, thoracic region: Secondary | ICD-10-CM | POA: Diagnosis not present

## 2015-12-19 DIAGNOSIS — M9902 Segmental and somatic dysfunction of thoracic region: Secondary | ICD-10-CM | POA: Diagnosis not present

## 2015-12-19 DIAGNOSIS — Z5181 Encounter for therapeutic drug level monitoring: Secondary | ICD-10-CM

## 2015-12-19 DIAGNOSIS — I4891 Unspecified atrial fibrillation: Secondary | ICD-10-CM | POA: Diagnosis not present

## 2015-12-19 DIAGNOSIS — M4726 Other spondylosis with radiculopathy, lumbar region: Secondary | ICD-10-CM | POA: Diagnosis not present

## 2015-12-19 DIAGNOSIS — Z7901 Long term (current) use of anticoagulants: Secondary | ICD-10-CM

## 2015-12-19 DIAGNOSIS — M9903 Segmental and somatic dysfunction of lumbar region: Secondary | ICD-10-CM | POA: Diagnosis not present

## 2015-12-19 LAB — POCT INR: INR: 2.6

## 2015-12-20 DIAGNOSIS — M9903 Segmental and somatic dysfunction of lumbar region: Secondary | ICD-10-CM | POA: Diagnosis not present

## 2015-12-20 DIAGNOSIS — M722 Plantar fascial fibromatosis: Secondary | ICD-10-CM | POA: Diagnosis not present

## 2015-12-20 DIAGNOSIS — M71571 Other bursitis, not elsewhere classified, right ankle and foot: Secondary | ICD-10-CM | POA: Diagnosis not present

## 2015-12-20 DIAGNOSIS — M4726 Other spondylosis with radiculopathy, lumbar region: Secondary | ICD-10-CM | POA: Diagnosis not present

## 2015-12-20 DIAGNOSIS — M4724 Other spondylosis with radiculopathy, thoracic region: Secondary | ICD-10-CM | POA: Diagnosis not present

## 2015-12-20 DIAGNOSIS — M9902 Segmental and somatic dysfunction of thoracic region: Secondary | ICD-10-CM | POA: Diagnosis not present

## 2016-01-16 ENCOUNTER — Encounter: Payer: Self-pay | Admitting: Internal Medicine

## 2016-01-16 ENCOUNTER — Ambulatory Visit (INDEPENDENT_AMBULATORY_CARE_PROVIDER_SITE_OTHER): Payer: Medicare Other | Admitting: Internal Medicine

## 2016-01-16 VITALS — BP 144/88 | HR 75 | Ht 66.0 in | Wt 221.4 lb

## 2016-01-16 DIAGNOSIS — I6350 Cerebral infarction due to unspecified occlusion or stenosis of unspecified cerebral artery: Secondary | ICD-10-CM

## 2016-01-16 DIAGNOSIS — Z950 Presence of cardiac pacemaker: Secondary | ICD-10-CM | POA: Diagnosis not present

## 2016-01-16 DIAGNOSIS — I480 Paroxysmal atrial fibrillation: Secondary | ICD-10-CM | POA: Diagnosis not present

## 2016-01-16 DIAGNOSIS — I442 Atrioventricular block, complete: Secondary | ICD-10-CM

## 2016-01-16 LAB — CUP PACEART INCLINIC DEVICE CHECK
Battery Remaining Longevity: 100 mo
Battery Voltage: 3.02 V
Brady Statistic AP VP Percent: 0 %
Brady Statistic AS VS Percent: 99.83 %
Date Time Interrogation Session: 20170425180035
Implantable Lead Implant Date: 20140618
Implantable Lead Location: 753859
Implantable Lead Location: 753860
Implantable Lead Model: 5086
Lead Channel Impedance Value: 342 Ohm
Lead Channel Impedance Value: 494 Ohm
Lead Channel Pacing Threshold Amplitude: 0.875 V
Lead Channel Pacing Threshold Amplitude: 1.5 V
Lead Channel Sensing Intrinsic Amplitude: 1.125 mV
Lead Channel Setting Pacing Amplitude: 2 V
Lead Channel Setting Sensing Sensitivity: 0.9 mV
MDC IDC LEAD IMPLANT DT: 20140618
MDC IDC LEAD MODEL: 5086
MDC IDC MSMT LEADCHNL RA IMPEDANCE VALUE: 380 Ohm
MDC IDC MSMT LEADCHNL RA PACING THRESHOLD PULSEWIDTH: 0.4 ms
MDC IDC MSMT LEADCHNL RA SENSING INTR AMPL: 0.875 mV
MDC IDC MSMT LEADCHNL RV IMPEDANCE VALUE: 418 Ohm
MDC IDC MSMT LEADCHNL RV PACING THRESHOLD PULSEWIDTH: 0.4 ms
MDC IDC MSMT LEADCHNL RV SENSING INTR AMPL: 13.875 mV
MDC IDC MSMT LEADCHNL RV SENSING INTR AMPL: 8.875 mV
MDC IDC SET LEADCHNL RV PACING AMPLITUDE: 3.25 V
MDC IDC SET LEADCHNL RV PACING PULSEWIDTH: 0.4 ms
MDC IDC STAT BRADY AP VS PERCENT: 0.13 %
MDC IDC STAT BRADY AS VP PERCENT: 0.04 %
MDC IDC STAT BRADY RA PERCENT PACED: 0.13 %
MDC IDC STAT BRADY RV PERCENT PACED: 0.04 %

## 2016-01-16 NOTE — Patient Instructions (Addendum)
Medication Instructions: - Stop amiodarone  Labwork: - none  Procedures/Testing: - none  Follow-Up: - Remote monitoring is used to monitor your Pacemaker of ICD from home. This monitoring reduces the number of office visits required to check your device to one time per year. It allows [place] to keep an eye on the functioning of your device to ensure it is working properly. You are scheduled for a device check from home on 04/16/16. You may send your transmission at any time that day. If you have a wireless device, the transmission will be sent automatically. After your physician reviews your transmission, you will receive a postcard with your next transmission date.  - Your physician wants you to follow-up in: 1 year with Dr. Caryl Comes. You will receive a reminder letter in the mail two months in advance. If you don't receive a letter, please call our office to schedule the follow-up appointment.  Any Additional Special Instructions Will Be Listed Below (If Applicable).     If you need a refill on your cardiac medications before your next appointment, please call your pharmacy.

## 2016-01-16 NOTE — Progress Notes (Signed)
Patient Care Team: [name], MD as PCP - General (Endocrinology)   HPI  Kayla Harrison is a 70 y.o. female Seen in followup for a pacemaker implanted at [facility]    She has atrial fibrillation for which she underwent pulmonary vein isolation at [facility] 2009 She's had problems with recurrent atrial fibrillation. She underwent convergent ablation at [facility] 1/63; it was complicated by heart block prompting pacemaker implantation.     She has a history of prior strokes and remains on oral anticoagulation . She was not tolerated  NOACs and is now on warfarin  She has scant palpitations. She fell and hurt her back. She has not had an MRI because of pacemaker.  She denies edema. She has no nocturnal dyspnea or orthopnea. She denies chest pai  Echo 2011 EF 55% with mod LAE-70m  Echo 7/14 normal EF      She also has a history of morbid obesity status post lap band surgery    Past Medical History  Diagnosis Date  . [place]bid [place] ([place]     Status post lap band surgery  . Hx of thyroid cancer     Dr SForde Dandy . Colon polyp     Dr MEarlean Shawl . Dyslipidemia   . Hyperlipidemia   . Hypothyroidism   . Breast cancer ([place]     Dr SMargot Chimes total thyroidectomy- 1999- for cancer  . [facility]   . Arthritis     osteoarthritis - knees   . Normal cardiac stress test     2008- cardiac stress/echo  . Stroke ([facility]     [place]  . Atrial fibrillation ([facility]     abl[facility]- [facility]'s-- [facility]  . Complication of anesthesia     Ketamine produces LSD reaction, bright colored nightmarish experience   . Hepatitis     [place]   . [place]     Complicating convergent ablation 6/14  . Pacemaker-Medtronic   . Renal calculi   . Dysrhythmia     a fib with internal/external ablation, currently NSR     Past Surgical History  Procedure Laterality Date  . Thyroidectomy  1998    Dr SMargot Chimes . Bso  1998  . Cholecystectomy    . Tonsillectomy    . Colonoscopy w/ polypectomy      Dr MEarlean Shawl . Afib ablation      [facility]; [facility] & Dr KCaryl Comes . Abdominal hysterectomy  1983  . Breast lumpectomy  2010  . Laparoscopic gastric banding  07/10/2010  . Appendectomy    . Knee arthroscopy      bilateral  . Laparotomy      for ruptured ovary and ovarian artery   . Total knee arthroplasty  04/13/2012    Procedure: TOTAL KNEE ARTHROPLASTY;  Surgeon: SRudean Haskell MD;  [place]  Service: Orthopedics;  Laterality: Right;  . Joint replacement  04/13/12    Right Knee replacement  . Cardioversion  10/09/2012    Procedure: CARDIOVERSION;  Surgeon: JMinus Breeding MD;  [place]  Service: Cardiovascular;  Laterality: N/A;  . Cardioversion  10/09/2012    Procedure: CARDIOVERSION;  Surgeon: JMinus Breeding MD;  [place]  Service: Cardiovascular;  Laterality: N/A;  [name]  the ok to add pt to the add on , but we must check to find out if the can add pt on at 1400 ( [phone])  . Cardioversion N/A 11/20/2012    Procedure: CARDIOVERSION;  Surgeon: Fay Records, MD;  Location: [facility] ENDOSCOPY;  Service: Cardiovascular;  Laterality: N/A;  . Transabdominal ablation  03/02/13    [facility]  . Pacemaker insertion  03/10/13    [facility]  . Convergent    . Pocket revision N/A 12/08/2013    Procedure: POCKET REVISION;  Surgeon: Deboraha Sprang, MD;  Location: [facility] CATH LAB;  Service: Cardiovascular;  Laterality: N/A;  . Eye surgery      cataract surgery  . Cystoscopy with retrograde pyelogram, ureteroscopy and stent placement Right 02/06/2015    Procedure: RETROGRADE PYELOGRAM, RIGHT URETEROSCOPY STENT PLACEMENT;  Surgeon: Kathie Rhodes, MD;  [facility];  Service: Urology;  Laterality: Right;  . Holmium laser application N/A 03/23/7792    Procedure: HOLMIUM LASER APPLICATION;  Surgeon: Kathie Rhodes, MD;   [facility];  Service: Urology;  Laterality: N/A;  . Cystoscopy N/A 02/06/2015    Procedure: CYSTOSCOPY;  Surgeon: Kathie Rhodes, MD;  [facility];  Service: Urology;  Laterality: N/A;    Current Outpatient Prescriptions  Medication Sig Dispense Refill  . amiodarone (PACERONE) 200 MG tablet Take 0.5 tablets (100 mg total) by mouth daily. 45 tablet 2  . cycloSPORINE (RESTASIS) 0.05 % ophthalmic emulsion Place 1 drop into both eyes 2 (two) times daily.    . furosemide (LASIX) 40 MG tablet Take 40 mg by mouth as directed.    Marland Kitchen HYDROcodone-acetaminophen (NORCO) 10-325 MG tablet Take 1 tablet by mouth every 6 (six) hours as needed. 30 tablet 0  . HYDROcodone-homatropine (HYDROMET) 5-1.5 MG/5ML syrup Take 5 mLs by mouth every 6 (six) hours as needed for cough. 120 mL 0  . levothyroxine (SYNTHROID, LEVOTHROID) 175 MCG tablet Take 175 mcg by mouth daily before breakfast. TAKE 1 TABLET BY MOUTH EVERY DAY EXCEPT ON SAT SHE DOES NOT TAKE AT ALL    . Multiple Vitamin (MULTIVITAMIN) tablet Take 2 tablets by mouth daily.     Marland Kitchen TAURINE PO Take 1 tablet by mouth daily.     Marland Kitchen warfarin (COUMADIN) 5 MG tablet Take as directed by  [facility] 90 tablet 0   No current facility-administered medications for this visit.    Allergies  Allergen Reactions  . Tikosyn [Dofetilide]     Prolonged QT interval Prolonged QT interval  . Xarelto [Rivaroxaban]     Nose Bleed X 6 hrs ; packing in ER  . Epinephrine Other (See Comments) and Rash    Dental form only (liquid). Patient stated she will become "out of it" she can hear you but cannot respond in normal fashion.  Marland Kitchen Ketamine     Hallucinations  . Eliquis [Apixaban] Other (See Comments)    BLEEDING IN URINE    Review of Systems negative except from HPI and PMH  Physical Exam BP 144/88 mmHg  Pulse 75  Ht '5\' 6"'$  (1.676 m)  Wt 221 lb 6.4 oz (100.426 kg)  BMI 35.75 kg/m2 Well developed and well nourished in no acute distress HENT normal E scleral  and icterus clear Neck Supple JVP flat; carotids brisk and full Clear to ausculation  Regular rate and rhythm, no murmurs gallops or rub Soft with active bowel sounds No clubbing cyanosis   Edema Alert and oriented, grossly normal motor and sensory function Skin Warm and  Dry  ECG was ordered today and demonstrated sinus rhythm at 75 Intervals 18/10/42 Axis XXXIX   Assessment and  Plan  Atrial fibrillation  S/p convergent ablation  Prior Strokes  Kidney Stones  Pacemaker Medtronic MRI compatible   NOAC intolerance   She continues with infrequent atrial fibrillation we discussed discontinuing her amiodarone. I will let the  Coumadin clinic no as adjustments may be necessary\  Her device is MRI compatible; it may be desirable to do an MRI given her back injury

## 2016-01-25 DIAGNOSIS — H04123 Dry eye syndrome of bilateral lacrimal glands: Secondary | ICD-10-CM | POA: Diagnosis not present

## 2016-01-31 ENCOUNTER — Ambulatory Visit (INDEPENDENT_AMBULATORY_CARE_PROVIDER_SITE_OTHER): Payer: Medicare Other | Admitting: *Deleted

## 2016-01-31 DIAGNOSIS — Z7901 Long term (current) use of anticoagulants: Secondary | ICD-10-CM | POA: Diagnosis not present

## 2016-01-31 DIAGNOSIS — I6350 Cerebral infarction due to unspecified occlusion or stenosis of unspecified cerebral artery: Secondary | ICD-10-CM

## 2016-01-31 DIAGNOSIS — Z5181 Encounter for therapeutic drug level monitoring: Secondary | ICD-10-CM

## 2016-01-31 DIAGNOSIS — I4891 Unspecified atrial fibrillation: Secondary | ICD-10-CM | POA: Diagnosis not present

## 2016-01-31 LAB — POCT INR: INR: 2.7

## 2016-02-08 DIAGNOSIS — M25511 Pain in right shoulder: Secondary | ICD-10-CM | POA: Diagnosis not present

## 2016-02-09 ENCOUNTER — Other Ambulatory Visit (HOSPITAL_COMMUNITY): Payer: Self-pay | Admitting: Orthopedic Surgery

## 2016-02-09 DIAGNOSIS — M25511 Pain in right shoulder: Secondary | ICD-10-CM

## 2016-02-12 ENCOUNTER — Other Ambulatory Visit: Payer: Self-pay | Admitting: Internal Medicine

## 2016-02-13 ENCOUNTER — Ambulatory Visit (INDEPENDENT_AMBULATORY_CARE_PROVIDER_SITE_OTHER): Payer: Medicare Other | Admitting: *Deleted

## 2016-02-13 DIAGNOSIS — Z7901 Long term (current) use of anticoagulants: Secondary | ICD-10-CM

## 2016-02-13 DIAGNOSIS — I4891 Unspecified atrial fibrillation: Secondary | ICD-10-CM | POA: Diagnosis not present

## 2016-02-13 DIAGNOSIS — Z5181 Encounter for therapeutic drug level monitoring: Secondary | ICD-10-CM

## 2016-02-13 DIAGNOSIS — I6350 Cerebral infarction due to unspecified occlusion or stenosis of unspecified cerebral artery: Secondary | ICD-10-CM

## 2016-02-13 LAB — POCT INR: INR: 2.1

## 2016-02-22 ENCOUNTER — Ambulatory Visit (HOSPITAL_COMMUNITY)
Admission: RE | Admit: 2016-02-22 | Discharge: 2016-02-22 | Disposition: A | Discharge status: Home / Self Care | Payer: Medicare Other | Source: Ambulatory Visit | Attending: Orthopedic Surgery | Admitting: Orthopedic Surgery

## 2016-02-22 DIAGNOSIS — S46811A Strain of other muscles, fascia and tendons at shoulder and upper arm level, right arm, initial encounter: Secondary | ICD-10-CM | POA: Diagnosis not present

## 2016-02-22 DIAGNOSIS — M19011 Primary osteoarthritis, right shoulder: Secondary | ICD-10-CM | POA: Diagnosis not present

## 2016-02-22 DIAGNOSIS — X58XXXA Exposure to other specified factors, initial encounter: Secondary | ICD-10-CM | POA: Insufficient documentation

## 2016-02-22 DIAGNOSIS — M25511 Pain in right shoulder: Secondary | ICD-10-CM | POA: Diagnosis present

## 2016-02-22 DIAGNOSIS — M75111 Incomplete rotator cuff tear or rupture of right shoulder, not specified as traumatic: Secondary | ICD-10-CM | POA: Diagnosis not present

## 2016-02-22 NOTE — Progress Notes (Signed)
Monitor for MRI HR 77 O2 sat 98 %

## 2016-02-27 ENCOUNTER — Ambulatory Visit (INDEPENDENT_AMBULATORY_CARE_PROVIDER_SITE_OTHER): Payer: Medicare Other | Admitting: *Deleted

## 2016-02-27 DIAGNOSIS — I4891 Unspecified atrial fibrillation: Secondary | ICD-10-CM | POA: Diagnosis not present

## 2016-02-27 DIAGNOSIS — M75111 Incomplete rotator cuff tear or rupture of right shoulder, not specified as traumatic: Secondary | ICD-10-CM | POA: Diagnosis not present

## 2016-02-27 DIAGNOSIS — Z7901 Long term (current) use of anticoagulants: Secondary | ICD-10-CM

## 2016-02-27 DIAGNOSIS — I6350 Cerebral infarction due to unspecified occlusion or stenosis of unspecified cerebral artery: Secondary | ICD-10-CM | POA: Diagnosis not present

## 2016-02-27 DIAGNOSIS — M7581 Other shoulder lesions, right shoulder: Secondary | ICD-10-CM | POA: Diagnosis not present

## 2016-02-27 DIAGNOSIS — M19011 Primary osteoarthritis, right shoulder: Secondary | ICD-10-CM | POA: Diagnosis not present

## 2016-02-27 DIAGNOSIS — Z5181 Encounter for therapeutic drug level monitoring: Secondary | ICD-10-CM | POA: Diagnosis not present

## 2016-02-27 LAB — POCT INR: INR: 2.6

## 2016-02-28 ENCOUNTER — Telehealth: Payer: Self-pay | Admitting: *Deleted

## 2016-02-28 ENCOUNTER — Encounter: Payer: Self-pay | Admitting: Internal Medicine

## 2016-02-28 NOTE — Telephone Encounter (Signed)
From: Ander Slade      Sent: 02/28/2016  2:45 PM      To: [facility] [address] [facility] [address] Triage    Subject: Non-Urgent Medical Question                 Should I stop coumadin for outpatient arthroscopic decompression surgery (right shoulder) (3 small holes)? Scheduled for June 14th if Dr. Caryl Comes has responded to request yesterday for info on pace maker. If Lovanox is to be taken, I would need to start it this weekend but do hope that I don't need to do this.    Thanks,    Kayla Harrison [employer] and spoke with [place] at Dr [name] Lucey's office and she states that cardiac clearance has not been obtained and the pt will not be scheduled for June 14th Brittany states that she will call pt and they will have to set up another date for this to be done [place] is to send over clearance for this procedure today June 7th. Contact person for scheduling at Dr [facility]  office  is [facility] phone number [phone]

## 2016-02-29 ENCOUNTER — Telehealth: Payer: Self-pay | Admitting: *Deleted

## 2016-02-29 ENCOUNTER — Encounter: Payer: Self-pay | Admitting: Internal Medicine

## 2016-02-29 NOTE — Progress Notes (Signed)
Fax received from Dr. Remo Lipps Lucey's office for clearance. Signed form faxed back to [phone]. Confirmation received. Per Dr. Caryl Comes, he spoke with Dr. Ronnie Derby earlier today and Dr. Ronnie Derby will perform the procedure with a therapeutic INR. No need for lovenox bridging.

## 2016-02-29 NOTE — Telephone Encounter (Signed)
Follow-up    The pt is calling back concerning the surgery form for Surgery outpatient center/Dr. Lorre Nick the approval form from Dr. Caryl Comes the pt has a pacemaker and the center needs the the last pacemaker check note faxed and the last office visit faxed in the pt states she has called 4 times but hasn't heard from no-one.  The pt states the notes have to be faxed by tomorrow evening so she can have her surgery other wise she has to re-schedule her surgery.

## 2016-02-29 NOTE — Telephone Encounter (Signed)
Called patient regarding MyChart message indicating that patient needed a pre-procedure pacer clearance form.  Patient reports that she knows nothing about a pacer clearance form.  She is concerned about paperwork that was given to Dr. Caryl Comes regarding her warfarin dosing pre-procedure.  Advised patient that I do not handle this type of paperwork, but that Dr. Caryl Comes and his nurse, Nira Conn, are taking care of it.  Advised that I will call Dr. Ruel Favors office tomorrow morning to find out if she needs a pre-procedure pacer clearance form signed and I will call patient back tomorrow morning.  Patient is agreeable to this plan and is appreciative of assistance.  She denies additional questions or concerns at this time.

## 2016-03-01 LAB — CUP PACEART REMOTE DEVICE CHECK
Battery Remaining Longevity: 101 mo
Brady Statistic AS VS Percent: 99.88 %
Brady Statistic RA Percent Paced: 0.08 %
Brady Statistic RV Percent Paced: 0.04 %
Date Time Interrogation Session: 20170123170624
Implantable Lead Implant Date: 20140618
Implantable Lead Location: 753860
Implantable Lead Model: 5086
Lead Channel Impedance Value: 399 Ohm
Lead Channel Impedance Value: 437 Ohm
Lead Channel Pacing Threshold Amplitude: 0.75 V
Lead Channel Pacing Threshold Pulse Width: 0.4 ms
Lead Channel Pacing Threshold Pulse Width: 0.4 ms
Lead Channel Sensing Intrinsic Amplitude: 1.125 mV
Lead Channel Sensing Intrinsic Amplitude: 1.125 mV
Lead Channel Setting Sensing Sensitivity: 0.9 mV
MDC IDC LEAD IMPLANT DT: 20140618
MDC IDC LEAD LOCATION: 753859
MDC IDC LEAD MODEL: 5086
MDC IDC MSMT BATTERY VOLTAGE: 3.02 V
MDC IDC MSMT LEADCHNL RA IMPEDANCE VALUE: 513 Ohm
MDC IDC MSMT LEADCHNL RV IMPEDANCE VALUE: 361 Ohm
MDC IDC MSMT LEADCHNL RV PACING THRESHOLD AMPLITUDE: 1.625 V
MDC IDC MSMT LEADCHNL RV SENSING INTR AMPL: 9.625 mV
MDC IDC MSMT LEADCHNL RV SENSING INTR AMPL: 9.625 mV
MDC IDC SET LEADCHNL RA PACING AMPLITUDE: 2 V
MDC IDC SET LEADCHNL RV PACING AMPLITUDE: 3.25 V
MDC IDC SET LEADCHNL RV PACING PULSEWIDTH: 0.4 ms
MDC IDC STAT BRADY AP VP PERCENT: 0 %
MDC IDC STAT BRADY AP VS PERCENT: 0.08 %
MDC IDC STAT BRADY AS VP PERCENT: 0.04 %

## 2016-03-01 NOTE — Telephone Encounter (Signed)
Spoke with receptionist at Dr. Ruel Favors office.  They received patient's cardiac clearance form received yesterday, so Debe Coder will fax a pacer clearance form on Monday.  Will make patient aware.  LMOM for patient advising of updated information.  Gave device clinic phone number if she has questions or concerns.

## 2016-03-06 DIAGNOSIS — M94211 Chondromalacia, right shoulder: Secondary | ICD-10-CM | POA: Diagnosis not present

## 2016-03-06 DIAGNOSIS — M19011 Primary osteoarthritis, right shoulder: Secondary | ICD-10-CM | POA: Diagnosis not present

## 2016-03-06 DIAGNOSIS — G8918 Other acute postprocedural pain: Secondary | ICD-10-CM | POA: Diagnosis not present

## 2016-03-06 DIAGNOSIS — M7541 Impingement syndrome of right shoulder: Secondary | ICD-10-CM | POA: Diagnosis not present

## 2016-03-06 DIAGNOSIS — M659 Synovitis and tenosynovitis, unspecified: Secondary | ICD-10-CM | POA: Diagnosis not present

## 2016-03-12 DIAGNOSIS — Z9889 Other specified postprocedural states: Secondary | ICD-10-CM | POA: Diagnosis not present

## 2016-03-12 DIAGNOSIS — R29898 Other symptoms and signs involving the musculoskeletal system: Secondary | ICD-10-CM | POA: Diagnosis not present

## 2016-03-12 DIAGNOSIS — M25611 Stiffness of right shoulder, not elsewhere classified: Secondary | ICD-10-CM | POA: Diagnosis not present

## 2016-03-19 ENCOUNTER — Ambulatory Visit (INDEPENDENT_AMBULATORY_CARE_PROVIDER_SITE_OTHER): Payer: Medicare Other | Admitting: Pharmacist

## 2016-03-19 DIAGNOSIS — I6350 Cerebral infarction due to unspecified occlusion or stenosis of unspecified cerebral artery: Secondary | ICD-10-CM

## 2016-03-19 DIAGNOSIS — Z5181 Encounter for therapeutic drug level monitoring: Secondary | ICD-10-CM | POA: Diagnosis not present

## 2016-03-19 DIAGNOSIS — Z7901 Long term (current) use of anticoagulants: Secondary | ICD-10-CM | POA: Diagnosis not present

## 2016-03-19 DIAGNOSIS — I4891 Unspecified atrial fibrillation: Secondary | ICD-10-CM | POA: Diagnosis not present

## 2016-03-19 LAB — POCT INR: INR: 2.3

## 2016-03-22 DIAGNOSIS — Z9889 Other specified postprocedural states: Secondary | ICD-10-CM | POA: Diagnosis not present

## 2016-03-22 DIAGNOSIS — R29898 Other symptoms and signs involving the musculoskeletal system: Secondary | ICD-10-CM | POA: Diagnosis not present

## 2016-03-22 DIAGNOSIS — M25611 Stiffness of right shoulder, not elsewhere classified: Secondary | ICD-10-CM | POA: Diagnosis not present

## 2016-03-28 DIAGNOSIS — R29898 Other symptoms and signs involving the musculoskeletal system: Secondary | ICD-10-CM | POA: Diagnosis not present

## 2016-03-28 DIAGNOSIS — M25611 Stiffness of right shoulder, not elsewhere classified: Secondary | ICD-10-CM | POA: Diagnosis not present

## 2016-03-28 DIAGNOSIS — Z9889 Other specified postprocedural states: Secondary | ICD-10-CM | POA: Diagnosis not present

## 2016-04-04 ENCOUNTER — Other Ambulatory Visit: Payer: Self-pay | Admitting: Endocrinology

## 2016-04-04 DIAGNOSIS — R5381 Other malaise: Secondary | ICD-10-CM | POA: Diagnosis not present

## 2016-04-04 DIAGNOSIS — C73 Malignant neoplasm of thyroid gland: Secondary | ICD-10-CM | POA: Diagnosis not present

## 2016-04-04 DIAGNOSIS — E668 Other obesity: Secondary | ICD-10-CM | POA: Diagnosis not present

## 2016-04-04 DIAGNOSIS — R51 Headache: Secondary | ICD-10-CM | POA: Diagnosis not present

## 2016-04-04 DIAGNOSIS — E785 Hyperlipidemia, unspecified: Secondary | ICD-10-CM | POA: Diagnosis not present

## 2016-04-04 DIAGNOSIS — M5416 Radiculopathy, lumbar region: Secondary | ICD-10-CM | POA: Diagnosis not present

## 2016-04-04 DIAGNOSIS — G8929 Other chronic pain: Secondary | ICD-10-CM

## 2016-04-04 DIAGNOSIS — R7301 Impaired fasting glucose: Secondary | ICD-10-CM | POA: Diagnosis not present

## 2016-04-04 DIAGNOSIS — R55 Syncope and collapse: Secondary | ICD-10-CM | POA: Diagnosis not present

## 2016-04-04 DIAGNOSIS — I779 Disorder of arteries and arterioles, unspecified: Secondary | ICD-10-CM | POA: Diagnosis not present

## 2016-04-04 DIAGNOSIS — Z6837 Body mass index (BMI) 37.0-37.9, adult: Secondary | ICD-10-CM | POA: Diagnosis not present

## 2016-04-04 DIAGNOSIS — K559 Vascular disorder of intestine, unspecified: Secondary | ICD-10-CM | POA: Diagnosis not present

## 2016-04-04 DIAGNOSIS — E890 Postprocedural hypothyroidism: Secondary | ICD-10-CM | POA: Diagnosis not present

## 2016-04-04 DIAGNOSIS — E784 Other hyperlipidemia: Secondary | ICD-10-CM | POA: Diagnosis not present

## 2016-04-04 DIAGNOSIS — I480 Paroxysmal atrial fibrillation: Secondary | ICD-10-CM | POA: Diagnosis not present

## 2016-04-04 DIAGNOSIS — C50911 Malignant neoplasm of unspecified site of right female breast: Secondary | ICD-10-CM | POA: Diagnosis not present

## 2016-04-05 DIAGNOSIS — M25611 Stiffness of right shoulder, not elsewhere classified: Secondary | ICD-10-CM | POA: Diagnosis not present

## 2016-04-05 DIAGNOSIS — R29898 Other symptoms and signs involving the musculoskeletal system: Secondary | ICD-10-CM | POA: Diagnosis not present

## 2016-04-05 DIAGNOSIS — Z9889 Other specified postprocedural states: Secondary | ICD-10-CM | POA: Diagnosis not present

## 2016-04-09 ENCOUNTER — Ambulatory Visit
Admission: RE | Admit: 2016-04-09 | Discharge: 2016-04-09 | Disposition: A | Discharge status: Home / Self Care | Payer: Medicare Other | Source: Ambulatory Visit | Attending: Endocrinology | Admitting: Endocrinology

## 2016-04-09 DIAGNOSIS — G8929 Other chronic pain: Secondary | ICD-10-CM

## 2016-04-09 DIAGNOSIS — R51 Headache: Principal | ICD-10-CM

## 2016-04-09 DIAGNOSIS — G44029 Chronic cluster headache, not intractable: Secondary | ICD-10-CM | POA: Diagnosis not present

## 2016-04-09 MED ORDER — IOPAMIDOL (ISOVUE-300) INJECTION 61%
75.0000 mL | Freq: Once | INTRAVENOUS | Status: AC | PRN
  Administered 2016-04-09: 75 mL via INTRAVENOUS

## 2016-04-10 DIAGNOSIS — M722 Plantar fascial fibromatosis: Secondary | ICD-10-CM | POA: Diagnosis not present

## 2016-04-10 DIAGNOSIS — M71571 Other bursitis, not elsewhere classified, right ankle and foot: Secondary | ICD-10-CM | POA: Diagnosis not present

## 2016-04-11 DIAGNOSIS — R29898 Other symptoms and signs involving the musculoskeletal system: Secondary | ICD-10-CM | POA: Diagnosis not present

## 2016-04-11 DIAGNOSIS — Z9889 Other specified postprocedural states: Secondary | ICD-10-CM | POA: Diagnosis not present

## 2016-04-11 DIAGNOSIS — M25611 Stiffness of right shoulder, not elsewhere classified: Secondary | ICD-10-CM | POA: Diagnosis not present

## 2016-04-16 ENCOUNTER — Ambulatory Visit (INDEPENDENT_AMBULATORY_CARE_PROVIDER_SITE_OTHER): Payer: Medicare Other | Admitting: *Deleted

## 2016-04-16 DIAGNOSIS — I442 Atrioventricular block, complete: Secondary | ICD-10-CM | POA: Diagnosis not present

## 2016-04-16 DIAGNOSIS — I495 Sick sinus syndrome: Secondary | ICD-10-CM

## 2016-04-17 ENCOUNTER — Encounter: Payer: Self-pay | Admitting: Cardiology

## 2016-04-17 LAB — CUP PACEART REMOTE DEVICE CHECK
Brady Statistic AP VP Percent: 0 %
Brady Statistic AP VS Percent: 0.04 %
Brady Statistic AS VP Percent: 0.03 %
Brady Statistic AS VS Percent: 99.93 %
Implantable Lead Implant Date: 20140618
Implantable Lead Location: 753859
Lead Channel Impedance Value: 399 Ohm
Lead Channel Impedance Value: 513 Ohm
Lead Channel Impedance Value: 570 Ohm
Lead Channel Pacing Threshold Amplitude: 0.875 V
Lead Channel Pacing Threshold Pulse Width: 0.4 ms
Lead Channel Sensing Intrinsic Amplitude: 9.5 mV
Lead Channel Sensing Intrinsic Amplitude: 9.5 mV
Lead Channel Setting Pacing Amplitude: 2 V
Lead Channel Setting Pacing Amplitude: 2.5 V
Lead Channel Setting Pacing Pulse Width: 0.4 ms
MDC IDC LEAD IMPLANT DT: 20140618
MDC IDC LEAD LOCATION: 753860
MDC IDC LEAD MODEL: 5086
MDC IDC LEAD MODEL: 5086
MDC IDC MSMT BATTERY REMAINING LONGEVITY: 98 mo
MDC IDC MSMT BATTERY VOLTAGE: 3.02 V
MDC IDC MSMT LEADCHNL RA PACING THRESHOLD AMPLITUDE: 0.875 V
MDC IDC MSMT LEADCHNL RA PACING THRESHOLD PULSEWIDTH: 0.4 ms
MDC IDC MSMT LEADCHNL RA SENSING INTR AMPL: 0.75 mV
MDC IDC MSMT LEADCHNL RA SENSING INTR AMPL: 0.75 mV
MDC IDC MSMT LEADCHNL RV IMPEDANCE VALUE: 627 Ohm
MDC IDC SESS DTM: 20170725122645
MDC IDC SET LEADCHNL RV SENSING SENSITIVITY: 0.9 mV
MDC IDC STAT BRADY RA PERCENT PACED: 0.04 %
MDC IDC STAT BRADY RV PERCENT PACED: 0.03 %

## 2016-04-17 NOTE — Progress Notes (Signed)
Remote pacemaker transmission.   

## 2016-04-18 DIAGNOSIS — Z9889 Other specified postprocedural states: Secondary | ICD-10-CM | POA: Diagnosis not present

## 2016-04-18 DIAGNOSIS — H04123 Dry eye syndrome of bilateral lacrimal glands: Secondary | ICD-10-CM | POA: Diagnosis not present

## 2016-04-18 DIAGNOSIS — R29898 Other symptoms and signs involving the musculoskeletal system: Secondary | ICD-10-CM | POA: Diagnosis not present

## 2016-04-18 DIAGNOSIS — M25611 Stiffness of right shoulder, not elsewhere classified: Secondary | ICD-10-CM | POA: Diagnosis not present

## 2016-04-23 ENCOUNTER — Ambulatory Visit (INDEPENDENT_AMBULATORY_CARE_PROVIDER_SITE_OTHER): Payer: Medicare Other | Admitting: *Deleted

## 2016-04-23 DIAGNOSIS — I4891 Unspecified atrial fibrillation: Secondary | ICD-10-CM

## 2016-04-23 DIAGNOSIS — Z7901 Long term (current) use of anticoagulants: Secondary | ICD-10-CM | POA: Diagnosis not present

## 2016-04-23 DIAGNOSIS — I6350 Cerebral infarction due to unspecified occlusion or stenosis of unspecified cerebral artery: Secondary | ICD-10-CM

## 2016-04-23 DIAGNOSIS — Z5181 Encounter for therapeutic drug level monitoring: Secondary | ICD-10-CM

## 2016-04-23 LAB — POCT INR: INR: 2.9

## 2016-05-06 ENCOUNTER — Ambulatory Visit: Payer: Medicare Other | Admitting: Neurology

## 2016-05-23 DIAGNOSIS — Z9889 Other specified postprocedural states: Secondary | ICD-10-CM | POA: Diagnosis not present

## 2016-05-23 DIAGNOSIS — M25511 Pain in right shoulder: Secondary | ICD-10-CM | POA: Diagnosis not present

## 2016-05-23 DIAGNOSIS — M19011 Primary osteoarthritis, right shoulder: Secondary | ICD-10-CM | POA: Diagnosis not present

## 2016-05-23 DIAGNOSIS — S46111D Strain of muscle, fascia and tendon of long head of biceps, right arm, subsequent encounter: Secondary | ICD-10-CM | POA: Diagnosis not present

## 2016-06-04 ENCOUNTER — Ambulatory Visit (INDEPENDENT_AMBULATORY_CARE_PROVIDER_SITE_OTHER): Payer: Medicare Other | Admitting: Pharmacist

## 2016-06-04 DIAGNOSIS — I4891 Unspecified atrial fibrillation: Secondary | ICD-10-CM

## 2016-06-04 DIAGNOSIS — Z7901 Long term (current) use of anticoagulants: Secondary | ICD-10-CM | POA: Diagnosis not present

## 2016-06-04 DIAGNOSIS — I6350 Cerebral infarction due to unspecified occlusion or stenosis of unspecified cerebral artery: Secondary | ICD-10-CM | POA: Diagnosis not present

## 2016-06-04 DIAGNOSIS — Z5181 Encounter for therapeutic drug level monitoring: Secondary | ICD-10-CM

## 2016-06-04 LAB — POCT INR: INR: 2.2

## 2016-06-16 DIAGNOSIS — J019 Acute sinusitis, unspecified: Secondary | ICD-10-CM | POA: Diagnosis not present

## 2016-06-25 DIAGNOSIS — M19011 Primary osteoarthritis, right shoulder: Secondary | ICD-10-CM | POA: Diagnosis not present

## 2016-06-28 DIAGNOSIS — Z9884 Bariatric surgery status: Secondary | ICD-10-CM | POA: Diagnosis not present

## 2016-06-28 DIAGNOSIS — K439 Ventral hernia without obstruction or gangrene: Secondary | ICD-10-CM | POA: Diagnosis not present

## 2016-07-15 ENCOUNTER — Other Ambulatory Visit: Payer: Self-pay | Admitting: Endocrinology

## 2016-07-15 DIAGNOSIS — Z9889 Other specified postprocedural states: Secondary | ICD-10-CM

## 2016-07-16 ENCOUNTER — Ambulatory Visit (INDEPENDENT_AMBULATORY_CARE_PROVIDER_SITE_OTHER): Payer: Medicare Other | Admitting: *Deleted

## 2016-07-16 DIAGNOSIS — I6350 Cerebral infarction due to unspecified occlusion or stenosis of unspecified cerebral artery: Secondary | ICD-10-CM | POA: Diagnosis not present

## 2016-07-16 DIAGNOSIS — Z7901 Long term (current) use of anticoagulants: Secondary | ICD-10-CM | POA: Diagnosis not present

## 2016-07-16 DIAGNOSIS — I4891 Unspecified atrial fibrillation: Secondary | ICD-10-CM | POA: Diagnosis not present

## 2016-07-16 DIAGNOSIS — Z5181 Encounter for therapeutic drug level monitoring: Secondary | ICD-10-CM | POA: Diagnosis not present

## 2016-07-16 LAB — POCT INR: INR: 3.2

## 2016-07-17 ENCOUNTER — Ambulatory Visit (INDEPENDENT_AMBULATORY_CARE_PROVIDER_SITE_OTHER): Payer: Medicare Other | Admitting: *Deleted

## 2016-07-17 DIAGNOSIS — I442 Atrioventricular block, complete: Secondary | ICD-10-CM

## 2016-07-17 DIAGNOSIS — I495 Sick sinus syndrome: Secondary | ICD-10-CM

## 2016-07-17 NOTE — Progress Notes (Signed)
Remote pacemaker transmission.   

## 2016-07-18 ENCOUNTER — Encounter: Payer: Self-pay | Admitting: Cardiology

## 2016-07-30 ENCOUNTER — Ambulatory Visit (INDEPENDENT_AMBULATORY_CARE_PROVIDER_SITE_OTHER): Payer: Medicare Other

## 2016-07-30 DIAGNOSIS — I4891 Unspecified atrial fibrillation: Secondary | ICD-10-CM | POA: Diagnosis not present

## 2016-07-30 DIAGNOSIS — Z5181 Encounter for therapeutic drug level monitoring: Secondary | ICD-10-CM

## 2016-07-30 DIAGNOSIS — Z23 Encounter for immunization: Secondary | ICD-10-CM | POA: Diagnosis not present

## 2016-07-30 DIAGNOSIS — I6350 Cerebral infarction due to unspecified occlusion or stenosis of unspecified cerebral artery: Secondary | ICD-10-CM | POA: Diagnosis not present

## 2016-07-30 DIAGNOSIS — Z7901 Long term (current) use of anticoagulants: Secondary | ICD-10-CM | POA: Diagnosis not present

## 2016-07-30 LAB — POCT INR: INR: 2.3

## 2016-08-02 DIAGNOSIS — K439 Ventral hernia without obstruction or gangrene: Secondary | ICD-10-CM | POA: Diagnosis not present

## 2016-08-12 ENCOUNTER — Ambulatory Visit
Admission: RE | Admit: 2016-08-12 | Discharge: 2016-08-12 | Disposition: A | Discharge status: Home / Self Care | Payer: Medicare Other | Source: Ambulatory Visit | Attending: Endocrinology | Admitting: Endocrinology

## 2016-08-12 DIAGNOSIS — R922 Inconclusive mammogram: Secondary | ICD-10-CM | POA: Diagnosis not present

## 2016-08-12 DIAGNOSIS — Z9889 Other specified postprocedural states: Secondary | ICD-10-CM

## 2016-08-13 DIAGNOSIS — Z7901 Long term (current) use of anticoagulants: Secondary | ICD-10-CM | POA: Diagnosis not present

## 2016-08-13 DIAGNOSIS — E668 Other obesity: Secondary | ICD-10-CM | POA: Diagnosis not present

## 2016-08-13 DIAGNOSIS — E890 Postprocedural hypothyroidism: Secondary | ICD-10-CM | POA: Diagnosis not present

## 2016-08-13 DIAGNOSIS — E784 Other hyperlipidemia: Secondary | ICD-10-CM | POA: Diagnosis not present

## 2016-08-13 DIAGNOSIS — I480 Paroxysmal atrial fibrillation: Secondary | ICD-10-CM | POA: Diagnosis not present

## 2016-08-13 DIAGNOSIS — N200 Calculus of kidney: Secondary | ICD-10-CM | POA: Diagnosis not present

## 2016-08-13 DIAGNOSIS — C50911 Malignant neoplasm of unspecified site of right female breast: Secondary | ICD-10-CM | POA: Diagnosis not present

## 2016-08-13 DIAGNOSIS — C73 Malignant neoplasm of thyroid gland: Secondary | ICD-10-CM | POA: Diagnosis not present

## 2016-08-13 DIAGNOSIS — Z6837 Body mass index (BMI) 37.0-37.9, adult: Secondary | ICD-10-CM | POA: Diagnosis not present

## 2016-08-13 DIAGNOSIS — R7301 Impaired fasting glucose: Secondary | ICD-10-CM | POA: Diagnosis not present

## 2016-08-14 LAB — CUP PACEART REMOTE DEVICE CHECK
Battery Remaining Longevity: 97 mo
Brady Statistic AP VS Percent: 0.05 %
Brady Statistic AS VS Percent: 99.92 %
Date Time Interrogation Session: 20171025124314
Implantable Lead Implant Date: 20140618
Implantable Lead Location: 753859
Lead Channel Impedance Value: 513 Ohm
Lead Channel Pacing Threshold Amplitude: 0.625 V
Lead Channel Sensing Intrinsic Amplitude: 0.75 mV
Lead Channel Sensing Intrinsic Amplitude: 0.75 mV
Lead Channel Sensing Intrinsic Amplitude: 8.875 mV
Lead Channel Setting Pacing Amplitude: 2.5 V
MDC IDC LEAD IMPLANT DT: 20140618
MDC IDC LEAD LOCATION: 753860
MDC IDC LEAD MODEL: 5086
MDC IDC LEAD MODEL: 5086
MDC IDC MSMT BATTERY VOLTAGE: 3.02 V
MDC IDC MSMT LEADCHNL RA IMPEDANCE VALUE: 399 Ohm
MDC IDC MSMT LEADCHNL RA PACING THRESHOLD AMPLITUDE: 0.875 V
MDC IDC MSMT LEADCHNL RA PACING THRESHOLD PULSEWIDTH: 0.4 ms
MDC IDC MSMT LEADCHNL RV IMPEDANCE VALUE: 779 Ohm
MDC IDC MSMT LEADCHNL RV IMPEDANCE VALUE: 836 Ohm
MDC IDC MSMT LEADCHNL RV PACING THRESHOLD PULSEWIDTH: 0.4 ms
MDC IDC MSMT LEADCHNL RV SENSING INTR AMPL: 8.875 mV
MDC IDC PG IMPLANT DT: 20140618
MDC IDC SET LEADCHNL RA PACING AMPLITUDE: 2 V
MDC IDC SET LEADCHNL RV PACING PULSEWIDTH: 0.4 ms
MDC IDC SET LEADCHNL RV SENSING SENSITIVITY: 0.9 mV
MDC IDC STAT BRADY AP VP PERCENT: 0 %
MDC IDC STAT BRADY AS VP PERCENT: 0.03 %
MDC IDC STAT BRADY RA PERCENT PACED: 0.05 %
MDC IDC STAT BRADY RV PERCENT PACED: 0.04 %

## 2016-08-27 ENCOUNTER — Ambulatory Visit (INDEPENDENT_AMBULATORY_CARE_PROVIDER_SITE_OTHER): Payer: Medicare Other | Admitting: *Deleted

## 2016-08-27 DIAGNOSIS — I4891 Unspecified atrial fibrillation: Secondary | ICD-10-CM

## 2016-08-27 DIAGNOSIS — Z5181 Encounter for therapeutic drug level monitoring: Secondary | ICD-10-CM | POA: Diagnosis not present

## 2016-08-27 DIAGNOSIS — I6350 Cerebral infarction due to unspecified occlusion or stenosis of unspecified cerebral artery: Secondary | ICD-10-CM | POA: Diagnosis not present

## 2016-08-27 DIAGNOSIS — Z7901 Long term (current) use of anticoagulants: Secondary | ICD-10-CM | POA: Diagnosis not present

## 2016-08-27 LAB — POCT INR: INR: 2.1

## 2016-08-29 DIAGNOSIS — M19011 Primary osteoarthritis, right shoulder: Secondary | ICD-10-CM | POA: Diagnosis not present

## 2016-08-29 DIAGNOSIS — S46211S Strain of muscle, fascia and tendon of other parts of biceps, right arm, sequela: Secondary | ICD-10-CM | POA: Diagnosis not present

## 2016-08-29 DIAGNOSIS — Z9889 Other specified postprocedural states: Secondary | ICD-10-CM | POA: Diagnosis not present

## 2016-09-08 IMAGING — CR DG ABDOMEN 1V
2 series · 2 of 2 positions shown · non-contrast
Comparison: [DATE]

CLINICAL DATA: Preoperative evaluation for right-sided lithotripsy

EXAM:
ABDOMEN - 1 VIEW

[t abdomen supine * (1 of 2)]
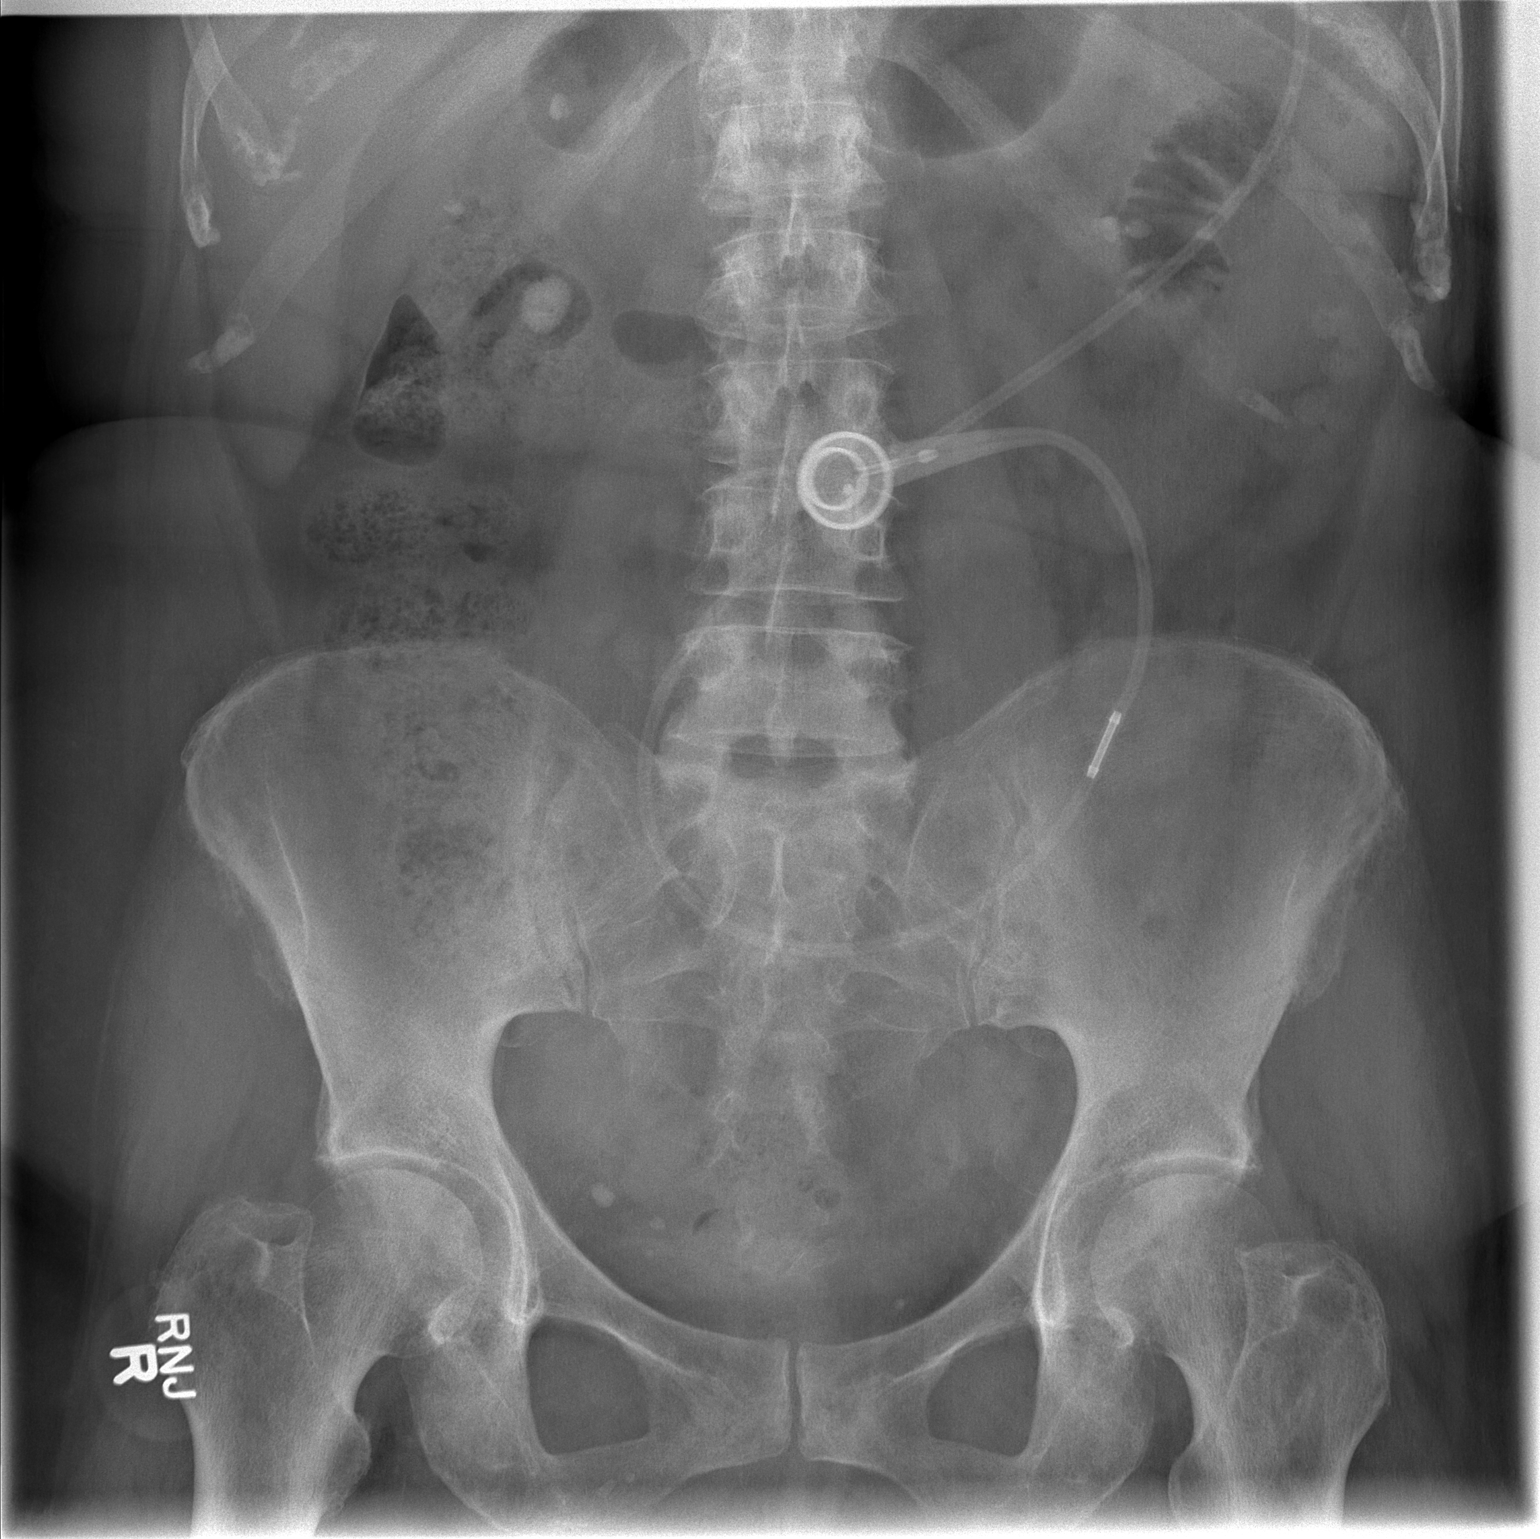

[t abdomen supine * (2 of 2)]
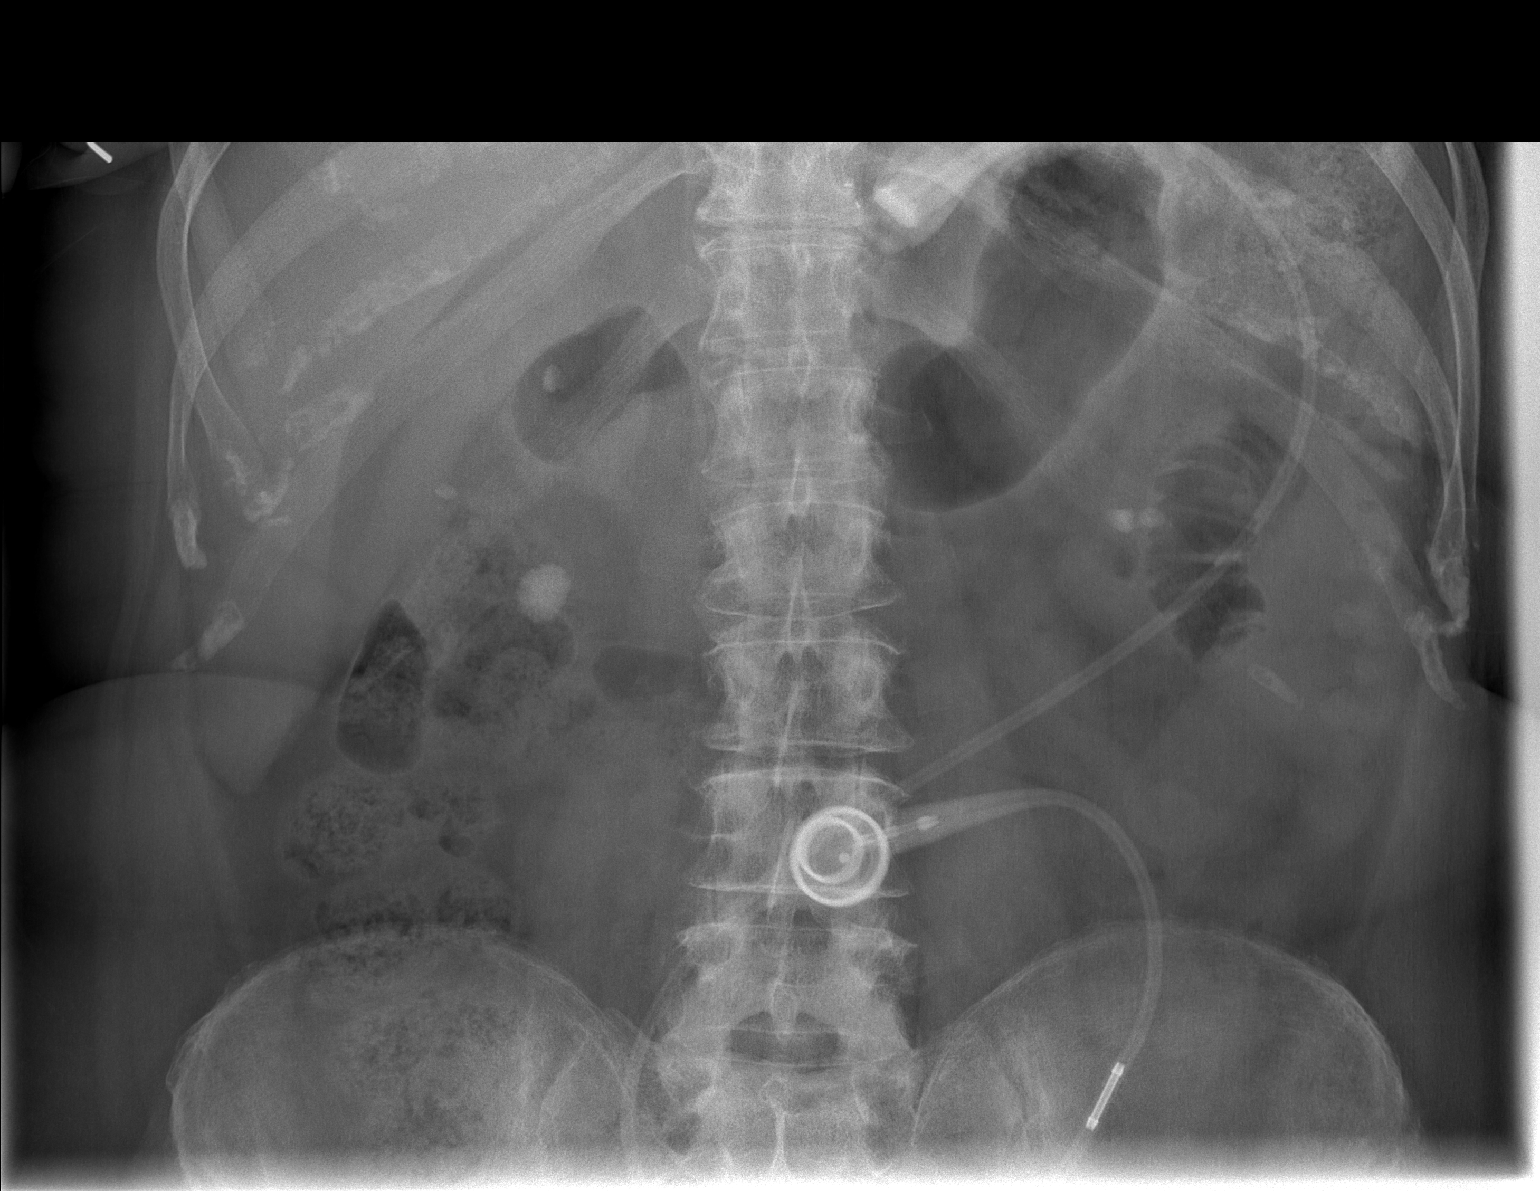

[2 of 2 positions shown; findings below may reference images not displayed]

FINDINGS: Scattered large and small bowel gas is noted. A gastric lap band is
again identified. Multiple calculi are identified within the kidneys
bilaterally. Largest of these lies in the right kidney measuring 18
mm in dimension. The largest stone on the left side measures just
under 8 mm. No bony abnormality is noted. Multiple phleboliths are
seen within the pelvis.
IMPRESSION: Stable renal calculi.

## 2016-09-24 ENCOUNTER — Ambulatory Visit (INDEPENDENT_AMBULATORY_CARE_PROVIDER_SITE_OTHER): Payer: Medicare Other | Admitting: *Deleted

## 2016-09-24 DIAGNOSIS — I6350 Cerebral infarction due to unspecified occlusion or stenosis of unspecified cerebral artery: Secondary | ICD-10-CM

## 2016-09-24 DIAGNOSIS — Z5181 Encounter for therapeutic drug level monitoring: Secondary | ICD-10-CM | POA: Diagnosis not present

## 2016-09-24 DIAGNOSIS — I4891 Unspecified atrial fibrillation: Secondary | ICD-10-CM

## 2016-09-24 DIAGNOSIS — Z7901 Long term (current) use of anticoagulants: Secondary | ICD-10-CM

## 2016-09-24 LAB — POCT INR: INR: 1.9

## 2016-10-06 ENCOUNTER — Inpatient Hospital Stay: Payer: Medicare Other | Admitting: Pulmonary Disease

## 2016-10-07 ENCOUNTER — Other Ambulatory Visit: Payer: Self-pay | Admitting: Internal Medicine

## 2016-10-07 DIAGNOSIS — Z780 Asymptomatic menopausal state: Secondary | ICD-10-CM | POA: Diagnosis not present

## 2016-10-16 ENCOUNTER — Ambulatory Visit (INDEPENDENT_AMBULATORY_CARE_PROVIDER_SITE_OTHER): Payer: Medicare Other | Admitting: *Deleted

## 2016-10-16 DIAGNOSIS — I495 Sick sinus syndrome: Secondary | ICD-10-CM | POA: Diagnosis not present

## 2016-10-16 NOTE — Progress Notes (Signed)
Remote pacemaker transmission.   

## 2016-10-17 ENCOUNTER — Encounter: Payer: Self-pay | Admitting: Cardiology

## 2016-10-17 LAB — CUP PACEART REMOTE DEVICE CHECK
Battery Voltage: 3.02 V
Implantable Lead Implant Date: 20140618
Implantable Lead Location: 753860
Implantable Pulse Generator Implant Date: 20140618
Lead Channel Impedance Value: 893 Ohm
Lead Channel Setting Pacing Amplitude: 2 V
Lead Channel Setting Pacing Pulse Width: 0.4 ms
MDC IDC LEAD IMPLANT DT: 20140618
MDC IDC LEAD LOCATION: 753859
MDC IDC MSMT LEADCHNL RA IMPEDANCE VALUE: 513 Ohm
MDC IDC SESS DTM: 20180125081203
MDC IDC SET LEADCHNL RV PACING AMPLITUDE: 3.25 V
MDC IDC SET LEADCHNL RV SENSING SENSITIVITY: 0.9 mV
MDC IDC STAT BRADY RA PERCENT PACED: 1 % — AB
MDC IDC STAT BRADY RV PERCENT PACED: 1 % — AB

## 2016-10-23 ENCOUNTER — Ambulatory Visit (INDEPENDENT_AMBULATORY_CARE_PROVIDER_SITE_OTHER): Payer: Medicare Other | Admitting: *Deleted

## 2016-10-23 DIAGNOSIS — I6350 Cerebral infarction due to unspecified occlusion or stenosis of unspecified cerebral artery: Secondary | ICD-10-CM | POA: Diagnosis not present

## 2016-10-23 DIAGNOSIS — Z5181 Encounter for therapeutic drug level monitoring: Secondary | ICD-10-CM

## 2016-10-23 DIAGNOSIS — Z7901 Long term (current) use of anticoagulants: Secondary | ICD-10-CM

## 2016-10-23 DIAGNOSIS — I4891 Unspecified atrial fibrillation: Secondary | ICD-10-CM

## 2016-10-23 LAB — POCT INR: INR: 1.5

## 2016-11-01 ENCOUNTER — Ambulatory Visit (INDEPENDENT_AMBULATORY_CARE_PROVIDER_SITE_OTHER): Payer: Medicare Other | Admitting: Pharmacist

## 2016-11-01 DIAGNOSIS — I4891 Unspecified atrial fibrillation: Secondary | ICD-10-CM

## 2016-11-01 DIAGNOSIS — I6350 Cerebral infarction due to unspecified occlusion or stenosis of unspecified cerebral artery: Secondary | ICD-10-CM

## 2016-11-01 DIAGNOSIS — Z7901 Long term (current) use of anticoagulants: Secondary | ICD-10-CM | POA: Diagnosis not present

## 2016-11-01 DIAGNOSIS — Z5181 Encounter for therapeutic drug level monitoring: Secondary | ICD-10-CM

## 2016-11-01 LAB — POCT INR: INR: 1.8

## 2016-11-15 ENCOUNTER — Ambulatory Visit (INDEPENDENT_AMBULATORY_CARE_PROVIDER_SITE_OTHER): Payer: Medicare Other | Admitting: Pharmacist

## 2016-11-15 DIAGNOSIS — I6350 Cerebral infarction due to unspecified occlusion or stenosis of unspecified cerebral artery: Secondary | ICD-10-CM | POA: Diagnosis not present

## 2016-11-15 DIAGNOSIS — Z5181 Encounter for therapeutic drug level monitoring: Secondary | ICD-10-CM | POA: Diagnosis not present

## 2016-11-15 DIAGNOSIS — Z7901 Long term (current) use of anticoagulants: Secondary | ICD-10-CM

## 2016-11-15 DIAGNOSIS — I4891 Unspecified atrial fibrillation: Secondary | ICD-10-CM

## 2016-11-15 LAB — POCT INR: INR: 2.5

## 2016-11-26 DIAGNOSIS — M19011 Primary osteoarthritis, right shoulder: Secondary | ICD-10-CM | POA: Diagnosis not present

## 2016-12-04 ENCOUNTER — Encounter: Payer: Self-pay | Admitting: Nurse Practitioner

## 2016-12-11 ENCOUNTER — Ambulatory Visit (INDEPENDENT_AMBULATORY_CARE_PROVIDER_SITE_OTHER): Payer: Medicare Other | Admitting: *Deleted

## 2016-12-11 ENCOUNTER — Ambulatory Visit: Payer: Medicare Other | Admitting: Nurse Practitioner

## 2016-12-11 DIAGNOSIS — Z5181 Encounter for therapeutic drug level monitoring: Secondary | ICD-10-CM

## 2016-12-11 DIAGNOSIS — I4891 Unspecified atrial fibrillation: Secondary | ICD-10-CM

## 2016-12-11 DIAGNOSIS — Z7901 Long term (current) use of anticoagulants: Secondary | ICD-10-CM | POA: Diagnosis not present

## 2016-12-11 DIAGNOSIS — I6350 Cerebral infarction due to unspecified occlusion or stenosis of unspecified cerebral artery: Secondary | ICD-10-CM

## 2016-12-11 LAB — POCT INR: INR: 2.4

## 2016-12-25 ENCOUNTER — Ambulatory Visit (INDEPENDENT_AMBULATORY_CARE_PROVIDER_SITE_OTHER): Payer: Medicare Other | Admitting: Internal Medicine

## 2016-12-25 ENCOUNTER — Encounter: Payer: Self-pay | Admitting: Internal Medicine

## 2016-12-25 VITALS — BP 120/86 | HR 77 | Ht 66.0 in | Wt 234.0 lb

## 2016-12-25 DIAGNOSIS — I480 Paroxysmal atrial fibrillation: Secondary | ICD-10-CM

## 2016-12-25 DIAGNOSIS — Z950 Presence of cardiac pacemaker: Secondary | ICD-10-CM

## 2016-12-25 DIAGNOSIS — I6350 Cerebral infarction due to unspecified occlusion or stenosis of unspecified cerebral artery: Secondary | ICD-10-CM

## 2016-12-25 DIAGNOSIS — I495 Sick sinus syndrome: Secondary | ICD-10-CM | POA: Diagnosis not present

## 2016-12-25 DIAGNOSIS — I442 Atrioventricular block, complete: Secondary | ICD-10-CM | POA: Diagnosis not present

## 2016-12-25 LAB — CUP PACEART INCLINIC DEVICE CHECK
Battery Remaining Longevity: 87 mo
Battery Voltage: 3.01 V
Brady Statistic AS VS Percent: 99.93 %
Implantable Lead Implant Date: 20140618
Implantable Lead Location: 753859
Implantable Pulse Generator Implant Date: 20140618
Lead Channel Impedance Value: 399 Ohm
Lead Channel Impedance Value: 931 Ohm
Lead Channel Pacing Threshold Amplitude: 0.75 V
Lead Channel Pacing Threshold Pulse Width: 0.4 ms
Lead Channel Sensing Intrinsic Amplitude: 1.25 mV
Lead Channel Setting Pacing Amplitude: 2 V
Lead Channel Setting Pacing Amplitude: 2.5 V
Lead Channel Setting Sensing Sensitivity: 0.9 mV
MDC IDC LEAD IMPLANT DT: 20140618
MDC IDC LEAD LOCATION: 753860
MDC IDC MSMT LEADCHNL RA IMPEDANCE VALUE: 513 Ohm
MDC IDC MSMT LEADCHNL RA PACING THRESHOLD AMPLITUDE: 0.75 V
MDC IDC MSMT LEADCHNL RA PACING THRESHOLD PULSEWIDTH: 0.4 ms
MDC IDC MSMT LEADCHNL RV IMPEDANCE VALUE: 855 Ohm
MDC IDC MSMT LEADCHNL RV SENSING INTR AMPL: 13.875 mV
MDC IDC SESS DTM: 20180404174754
MDC IDC SET LEADCHNL RV PACING PULSEWIDTH: 0.4 ms
MDC IDC STAT BRADY AP VP PERCENT: 0 %
MDC IDC STAT BRADY AP VS PERCENT: 0.03 %
MDC IDC STAT BRADY AS VP PERCENT: 0.03 %
MDC IDC STAT BRADY RA PERCENT PACED: 0.03 %
MDC IDC STAT BRADY RV PERCENT PACED: 0.03 %

## 2016-12-25 MED ORDER — BISOPROLOL FUMARATE 5 MG PO TABS: ORAL_TABLET | ORAL | 3 refills | Status: DC

## 2016-12-25 NOTE — Patient Instructions (Addendum)
Medication Instructions: - Your physician has recommended you make the following change in your medication:  1) Start bisoprolol 5 mg- take 1/2 tablet (2.5 mg) by mouth once daily  Labwork: - none ordered  Procedures/Testing: - none ordered  Follow-Up: - Remote monitoring is used to monitor your Pacemaker of ICD from home. This monitoring reduces the number of office visits required to check your device to one time per year. It allows [place] to keep an eye on the functioning of your device to ensure it is working properly. You are scheduled for a device check from home on 01/29/17 You may send your transmission at any time that day. If you have a wireless device, the transmission will be sent automatically. After your physician reviews your transmission, you will receive a postcard with your next transmission date.  - Your physician wants you to follow-up in: 6 months with Dr. Caryl Comes. You will receive a reminder letter in the mail two months in advance. If you don't receive a letter, please call our office to schedule the follow-up appointment.     Any Additional Special Instructions Will Be Listed Below (If Applicable).     If you need a refill on your cardiac medications before your next appointment, please call your pharmacy.

## 2016-12-25 NOTE — Progress Notes (Signed)
Patient Care Team: [name], MD as PCP - General (Endocrinology)   HPI  Kayla Harrison is a 71 y.o. female Seen in followup for a pacemaker implanted at [facility]    She has atrial fibrillation for which she underwent pulmonary vein isolation at [facility] 2009 She's had problems with recurrent atrial fibrillation. She underwent convergent ablation at [facility] 4/43; it was complicated by heart block prompting pacemaker implantation.     She has a history of prior strokes and remains on oral anticoagulation . She was not tolerated  NOACs and is now on warfarin  Over the last 4-5 months she has had more atrial fibrillation. This is concurrent with having had to go to [place] to pick up her sister who was psychotic and to bring her home. She now lives with her. Her brother died last week.  She denies edema. She has no nocturnal dyspnea or orthopnea. She denies chest pain  She has also been complaining of dizziness with bending. This may occur in the context of modest dehydration and is improved with having increased her fluid intake    Echo 2011 EF 55% with mod LAE-105m  Echo 7/14 normal EF      She also has a history of morbid obesity status post lap band surgery    Past Medical History:  Diagnosis Date  . Arthritis    osteoarthritis - knees   . Atrial fibrillation ([facility]    [facility]-- [facility]  . [place]    Dr SMargot Chimes total thyroidectomy- 1999- for cancer  . Colon polyp    Dr MEarlean Shawl . Complication of anesthesia    Ketamine produces LSD reaction, bright colored nightmarish experience   . Dyslipidemia   . Dysrhythmia    a fib with internal/external ablation, currently NSR  . Hepatitis    [place] a [place], ?hepatitis   . Hx of thyroid cancer    Dr SForde Dandy . Hyperlipidemia   . Hypothyroidism   . [place]bid [place]    Status post lap band surgery    . Normal cardiac stress test    2008- cardiac stress/echo  . Pacemaker-Medtronic   . [facility]   . Renal calculi   . [place]us node dysfunction ([place]    Complicating convergent ablation 6/14  . Stroke ([facility]    [place]    Past Surgical History:  Procedure Laterality Date  . ABDOMINAL HYSTERECTOMY  1983  . afib ablation     [facility]; [facility] & Dr KCaryl Comes . APPENDECTOMY    . BREAST LUMPECTOMY  2010  . bso  1998  . CARDIOVERSION  10/09/2012   Procedure: CARDIOVERSION;  Surgeon: JMinus Breeding MD;  [place]  Service: Cardiovascular;  Laterality: N/A;  . CARDIOVERSION  10/09/2012   Procedure: CARDIOVERSION;  Surgeon: JMinus Breeding MD;  [facility];  Service: Cardiovascular;  Laterality: N/A;  [name] the ok to add pt to the add on , but we must check to find out if the can add pt on at 1400 ( [phone])  . CARDIOVERSION N/A 11/20/2012   Procedure: CARDIOVERSION;  Surgeon: PFay Records MD;  [facility];  Service: Cardiovascular;  Laterality: N/A;  . CHOLECYSTECTOMY    . COLONOSCOPY W/ POLYPECTOMY  Dr Earlean Shawl  . convergent    . CYSTOSCOPY N/A 02/06/2015   Procedure: CYSTOSCOPY;  Surgeon: Kathie Rhodes, MD;  [facility];  Service: Urology;  Laterality: N/A;  . CYSTOSCOPY WITH RETROGRADE PYELOGRAM, URETEROSCOPY AND STENT PLACEMENT Right 02/06/2015   Procedure: RETROGRADE PYELOGRAM, RIGHT URETEROSCOPY STENT PLACEMENT;  Surgeon: Kathie Rhodes, MD;  [facility];  Service: Urology;  Laterality: Right;  . EYE SURGERY     cataract surgery  . HOLMIUM LASER APPLICATION N/A 9/83/3825   Procedure: HOLMIUM LASER APPLICATION;  Surgeon: Kathie Rhodes, MD;  [facility];  Service: Urology;  Laterality: N/A;  . JOINT REPLACEMENT  04/13/12   Right Knee replacement  . KNEE ARTHROSCOPY     bilateral  . LAPAROSCOPIC GASTRIC BANDING  07/10/2010  . LAPAROTOMY     for ruptured ovary and ovarian artery   . PACEMAKER  INSERTION  03/10/13   [facility]  . POCKET REVISION N/A 12/08/2013   Procedure: POCKET REVISION;  Surgeon: Deboraha Sprang, MD;  [facility] C[facility] LAB;  Service: Cardiovascular;  Laterality: N/A;  . THYROIDECTOMY  1998   Dr Margot Chimes  . TONSILLECTOMY    . TOTAL KNEE ARTHROPLASTY  04/13/2012   Procedure: TOTAL KNEE ARTHROPLASTY;  Surgeon: Rudean Haskell, MD;  Location: [place];  Service: Orthopedics;  Laterality: Right;  . Transabdominal ablation  03/02/13   [facility]    Current Outpatient Prescriptions  Medication Sig Dispense Refill  . cycloSPORINE (RESTASIS) 0.05 % ophthalmic emulsion Place 1 drop into both eyes 2 (two) times daily.    . furosemide (LASIX) 40 MG tablet Take 40 mg by mouth as directed.    Marland Kitchen levothyroxine (SYNTHROID, LEVOTHROID) 175 MCG tablet Take 175 mcg by mouth daily before breakfast. TAKE 1 TABLET BY MOUTH EVERY DAY EXCEPT ON SAT SHE DOES NOT TAKE AT ALL    . Multiple Vitamin (MULTIVITAMIN) tablet Take 2 tablets by mouth daily.     Marland Kitchen warfarin (COUMADIN) 5 MG tablet TAKE AS DIRECTED BY  COUMADIN CLINIC 90 tablet 1  . bisoprolol (ZEBETA) 5 MG tablet Take 1/2 tablet (2.5 mg) by mouth once daily 45 tablet 3   No current facility-administered medications for this visit.     Allergies  Allergen Reactions  . Tikosyn [Dofetilide]     Prolonged QT interval Prolonged QT interval  . Xarelto [Rivaroxaban]     Nose Bleed X 6 hrs ; packing in ER  . Epinephrine Other (See Comments) and Rash    Dental form only (liquid). Patient stated she will become "out of it" she can hear you but cannot respond in normal fashion.  Marland Kitchen Ketamine     Hallucinations  . Eliquis [Apixaban] Other (See Comments)    BLEEDING IN URINE    Review of Systems negative except from HPI and PMH  Physical Exam BP 120/86   Pulse 77   Ht '5\' 6"'$  (1.676 m)   Wt 234 lb (106.1 kg)   SpO2 98%   BMI 37.77 kg/m  Well developed and well nourished in no acute distress HENT normal E scleral and icterus clear Neck  Supple JVP flat; carotids brisk and full Clear to ausculation  Regular rate and rhythm, no murmurs gallops or rub Soft with active bowel sounds No clubbing cyanosis   Edema Alert and oriented, grossly normal motor and sensory function Skin Warm and Dry  ECG was ordered today and demonstrated sinus rhythm at 75    Assessment and  Plan  Atrial fibrillation  S/p  convergent ablation  Prior Strokes   Orthostatic dizziness  Pacemaker Medtronic MRI compatible   NOAC intolerance    she has been having increasing problems with atrial fibrillation. These are concurrent with the stresses of her sister and her brother. I'm not sure if that is the whole story. We will try her on low-dose beta blockers to see if we can attenuate any adrenergic stimulation which may be provoking her atrial fibrillation. I'm not altogether sanguine.  On Anticoagulation;  No bleeding issues   Her weight has been relatively stable. I wonder whether her orthostatic dizziness was related to dehydration and is improved. Hydration may help also to ward off kidney stones  We spent more than 50% of our >25 min visit in face to face counseling regarding the above

## 2016-12-30 ENCOUNTER — Encounter: Payer: Medicare Other | Admitting: Internal Medicine

## 2017-01-08 ENCOUNTER — Ambulatory Visit (INDEPENDENT_AMBULATORY_CARE_PROVIDER_SITE_OTHER): Payer: Medicare Other | Admitting: *Deleted

## 2017-01-08 DIAGNOSIS — Z5181 Encounter for therapeutic drug level monitoring: Secondary | ICD-10-CM

## 2017-01-08 DIAGNOSIS — I6350 Cerebral infarction due to unspecified occlusion or stenosis of unspecified cerebral artery: Secondary | ICD-10-CM

## 2017-01-08 DIAGNOSIS — Z7901 Long term (current) use of anticoagulants: Secondary | ICD-10-CM | POA: Diagnosis not present

## 2017-01-08 DIAGNOSIS — I4891 Unspecified atrial fibrillation: Secondary | ICD-10-CM | POA: Diagnosis not present

## 2017-01-08 LAB — POCT INR: INR: 3

## 2017-01-21 DIAGNOSIS — C3430 Malignant neoplasm of lower lobe, unspecified bronchus or lung: Secondary | ICD-10-CM

## 2017-01-21 HISTORY — DX: Malignant neoplasm of lower lobe, unspecified bronchus or lung: C34.30

## 2017-01-29 ENCOUNTER — Encounter: Payer: Medicare Other | Admitting: *Deleted

## 2017-01-29 ENCOUNTER — Telehealth: Payer: Self-pay | Admitting: Cardiology

## 2017-01-29 NOTE — Telephone Encounter (Signed)
Received page from pt that her face was swollen and she was having trouble breathing, which she thought was a reaction to a new medication, bisoprolol.  I instructed her to call 911 to be taken to the emergency department, but pt strongly preferred not to at present, stating that these symptoms have been going on for a week, and she was calling for a plan on changing medications.  I reiterated concerns for anaphylaxis and indications for urgent and emergent evaluation; she insisted that this was a more subacute issue.  She will call the office in the AM to get further instructions on possible alternatives to bisoprolol.

## 2017-01-30 IMAGING — DX DG WRIST COMPLETE 3+V*R*
4 series · 4 of 4 positions shown · non-contrast
Comparison: None.

CLINICAL DATA: Fell last night with pain and bruising on the radial
side of the wrist.

EXAM:
RIGHT WRIST - COMPLETE 3+ VIEW

[wrist pa]
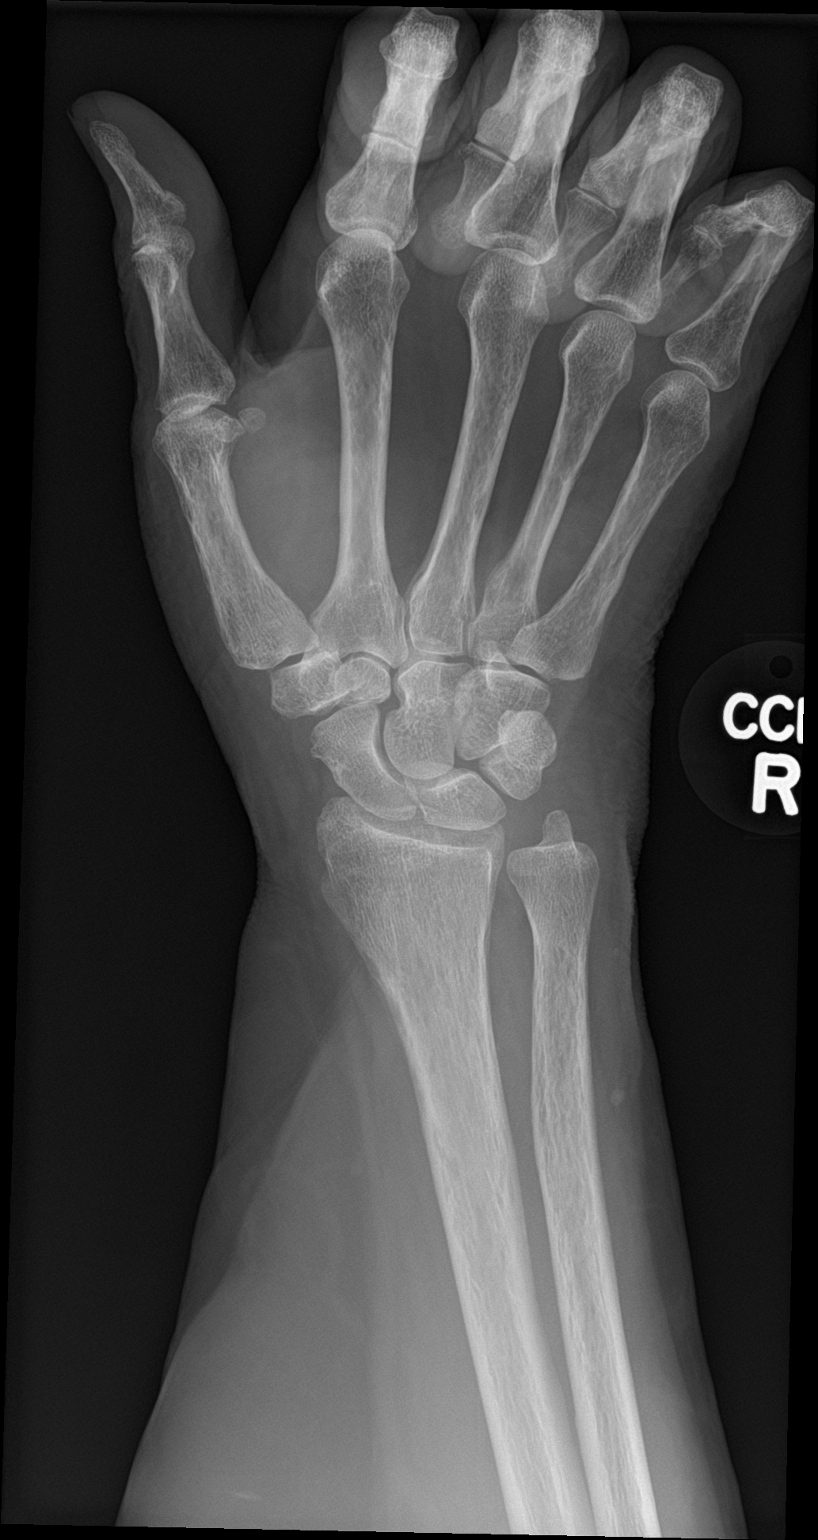

[wrist obl]
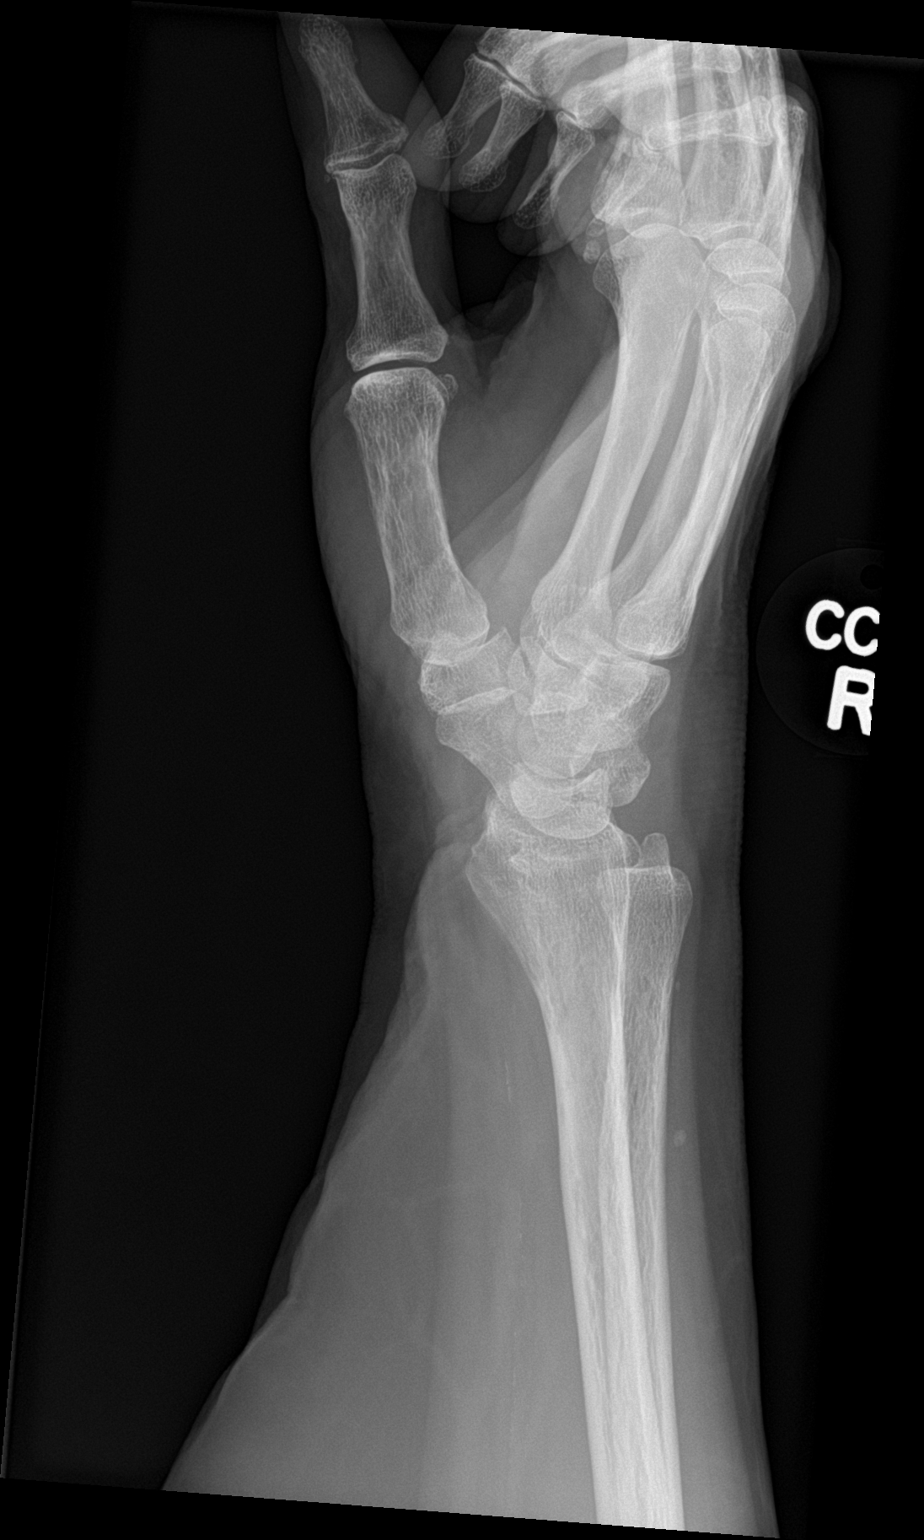

[wrist lat]
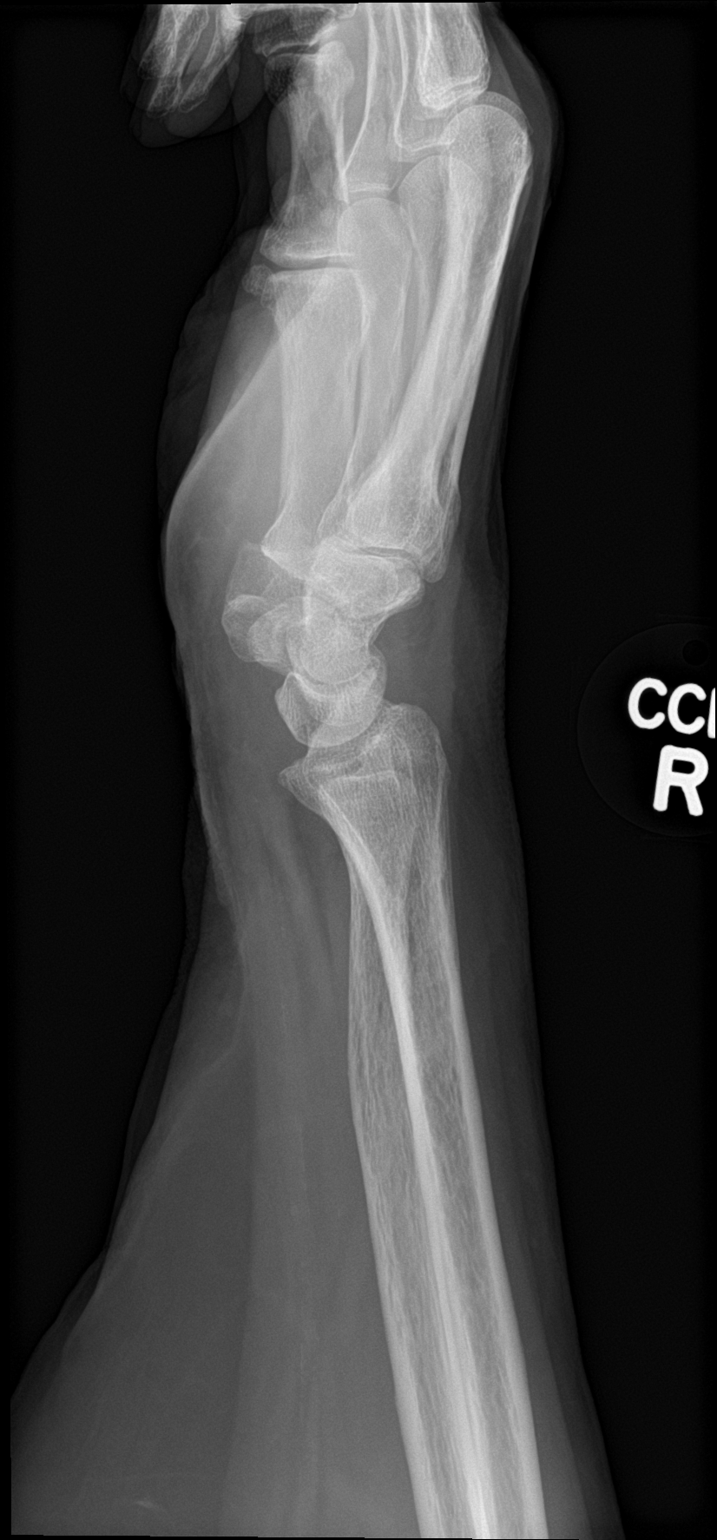

[wrist navicular]
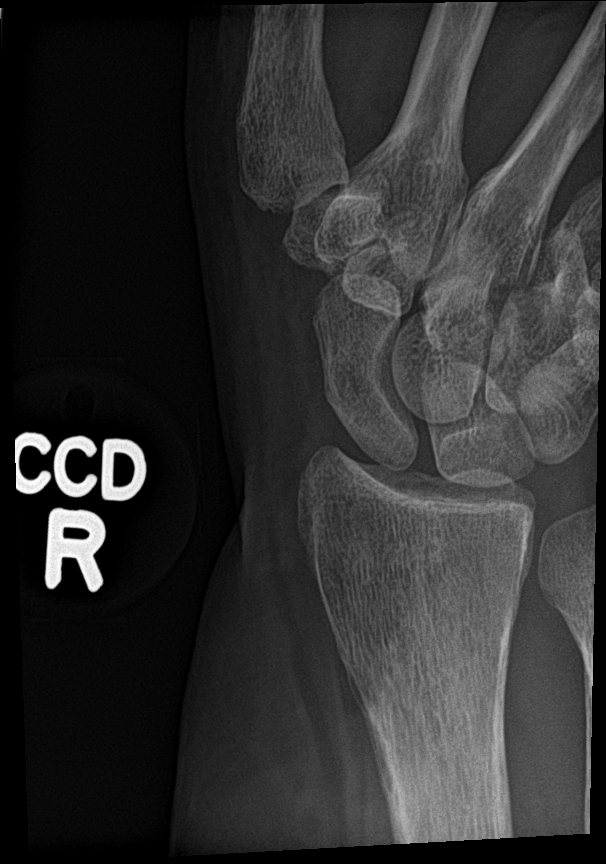

[4 of 4 positions shown; findings below may reference images not displayed]

FINDINGS: There is no evidence of fracture or dislocation. There is no
evidence of arthropathy or other focal bone abnormality. Soft
tissues are unremarkable.
IMPRESSION: Negative.

## 2017-01-30 IMAGING — DX DG CHEST 2V
2 series · 2 of 2 positions shown · non-contrast
Comparison: PA and lateral chest x-ray July 26, 2013

CLINICAL DATA: Cough, wheezing for the past 5 days,, history of
atrial fibrillation, chronic CHF.

EXAM:
CHEST  2 VIEW

[chest pa]
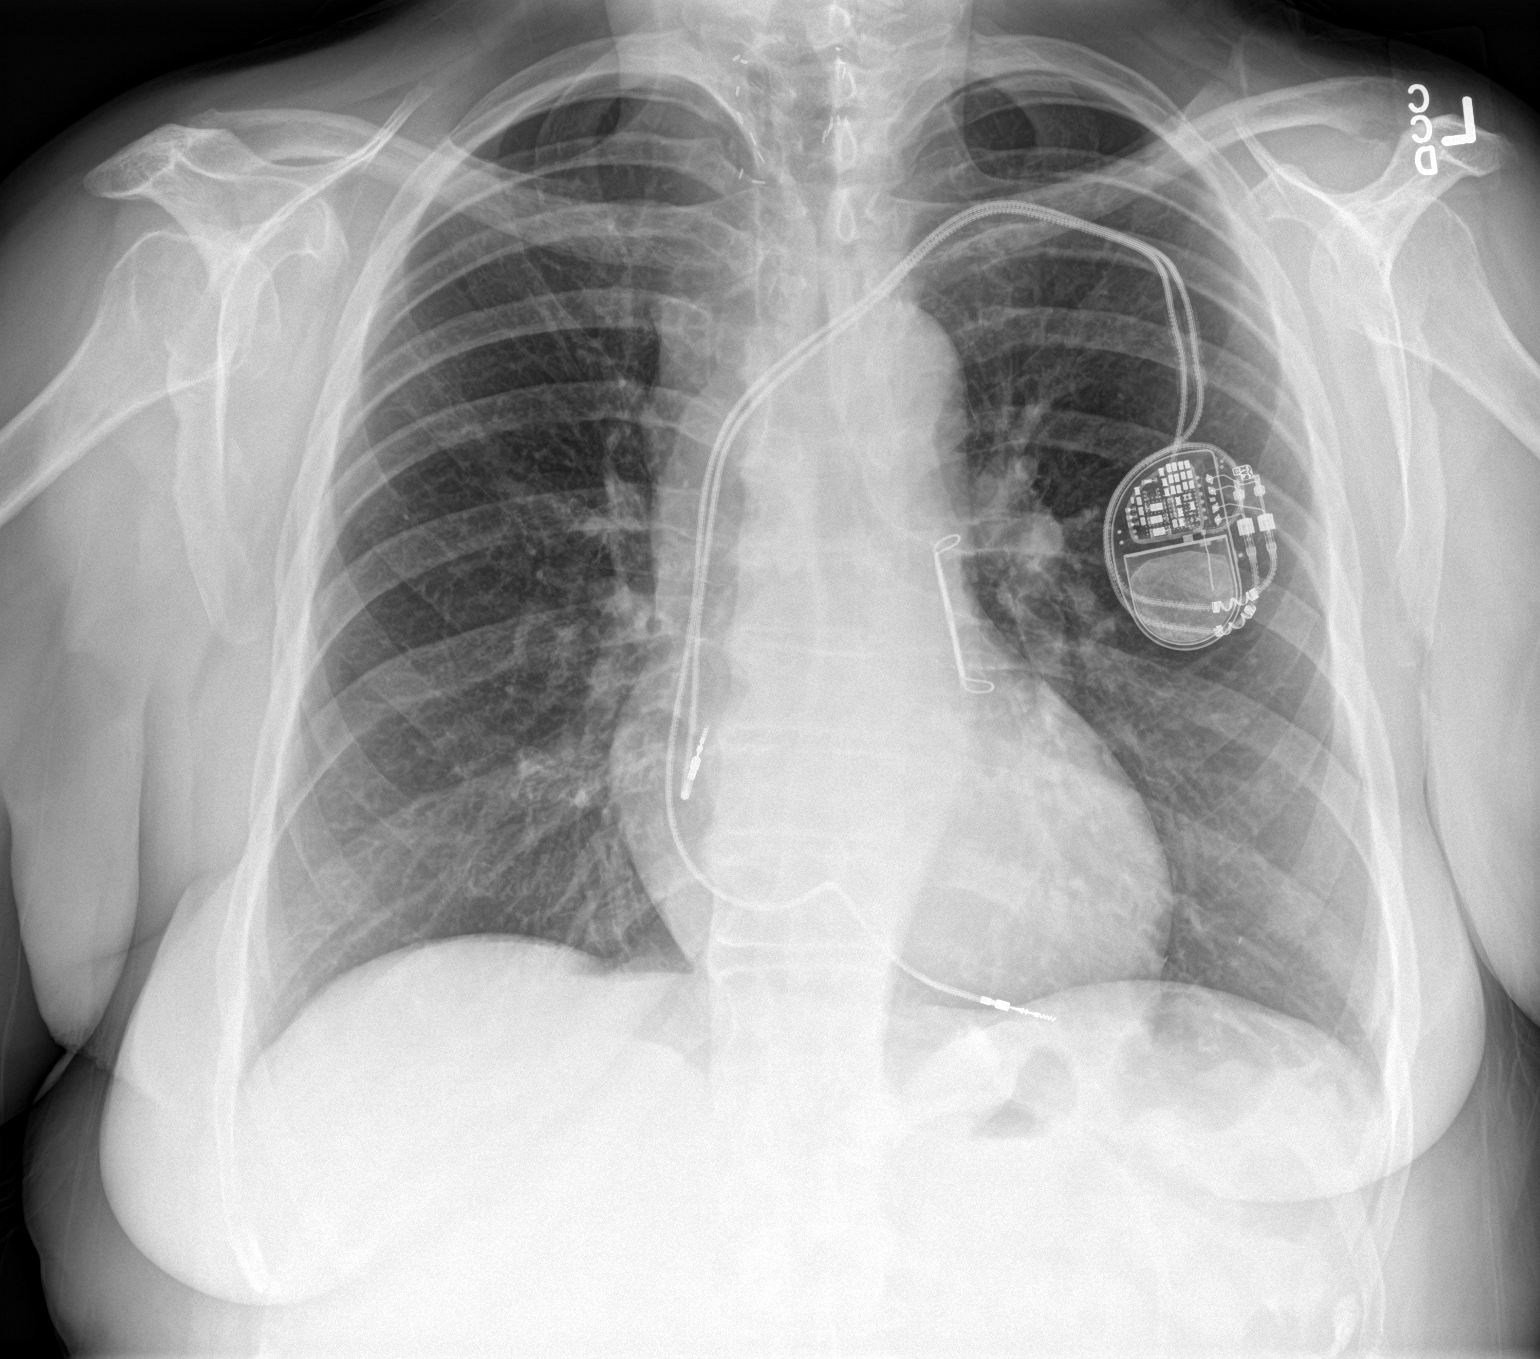

[chest lat]
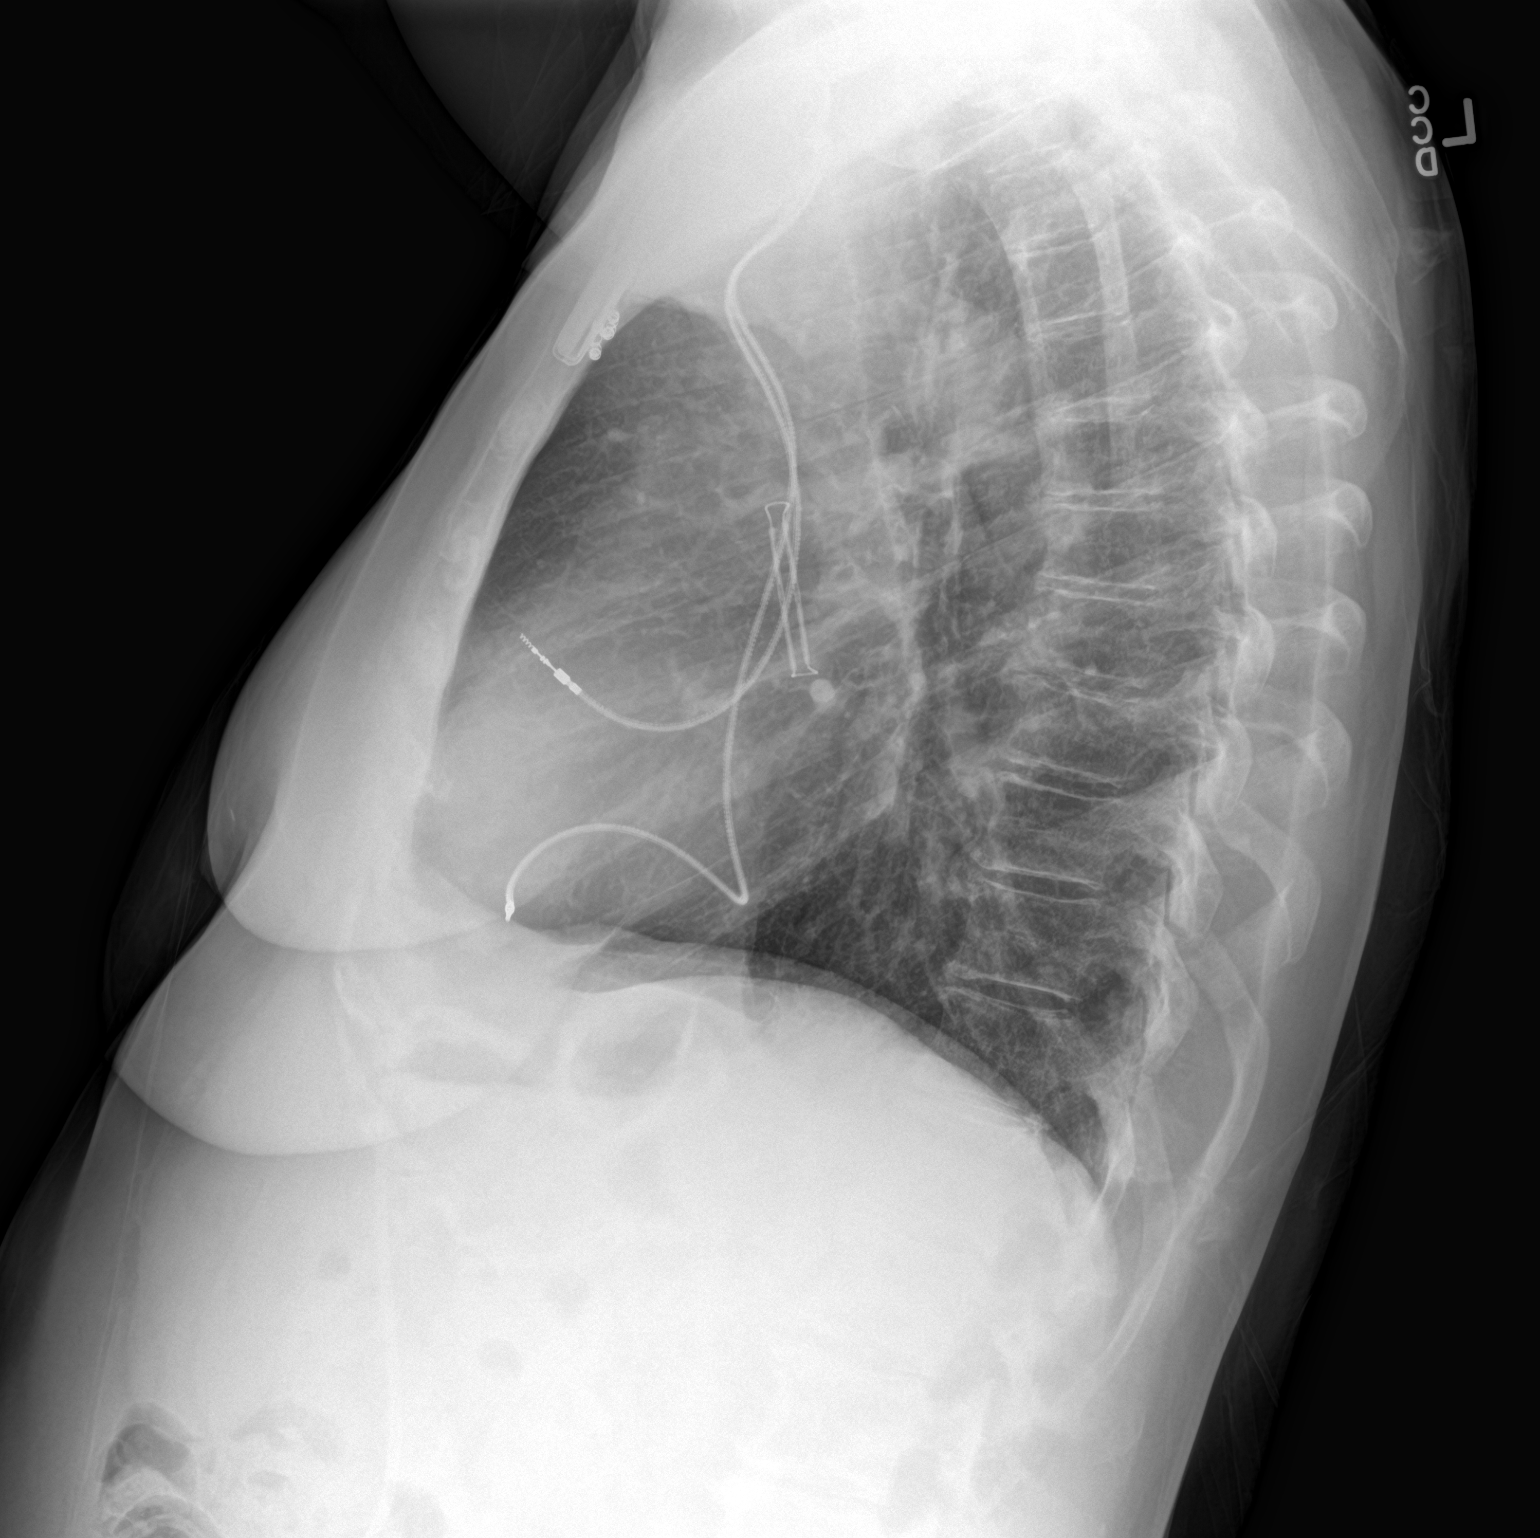

[2 of 2 positions shown; findings below may reference images not displayed]

FINDINGS: The lungs are well-expanded. There is no focal infiltrate. There is
no pleural effusion. The heart and pulmonary vascularity are normal.
A left atrial appendage clip is present. The permanent pacemaker is
in good position radiographically. The mediastinum is normal in
width. The bony thorax exhibits no acute abnormality.
IMPRESSION: There is no active cardiopulmonary disease.

## 2017-01-31 ENCOUNTER — Encounter: Payer: Self-pay | Admitting: Cardiology

## 2017-02-03 ENCOUNTER — Telehealth: Payer: Self-pay | Admitting: Internal Medicine

## 2017-02-03 ENCOUNTER — Emergency Department (HOSPITAL_COMMUNITY): Payer: Medicare Other

## 2017-02-03 ENCOUNTER — Inpatient Hospital Stay (HOSPITAL_COMMUNITY)
Admission: EM | Admit: 2017-02-03 | Discharge: 2017-02-08 | DRG: 841 | Disposition: A | Discharge status: Home / Self Care | Payer: Medicare Other | Attending: Internal Medicine | Admitting: Internal Medicine

## 2017-02-03 ENCOUNTER — Encounter (HOSPITAL_COMMUNITY): Payer: Self-pay | Admitting: *Deleted

## 2017-02-03 DIAGNOSIS — R51 Headache: Secondary | ICD-10-CM

## 2017-02-03 DIAGNOSIS — Z800 Family history of malignant neoplasm of digestive organs: Secondary | ICD-10-CM

## 2017-02-03 DIAGNOSIS — Z85850 Personal history of malignant neoplasm of thyroid: Secondary | ICD-10-CM | POA: Diagnosis not present

## 2017-02-03 DIAGNOSIS — Z7901 Long term (current) use of anticoagulants: Secondary | ICD-10-CM | POA: Diagnosis not present

## 2017-02-03 DIAGNOSIS — R938 Abnormal findings on diagnostic imaging of other specified body structures: Secondary | ICD-10-CM

## 2017-02-03 DIAGNOSIS — I5043 Acute on chronic combined systolic (congestive) and diastolic (congestive) heart failure: Secondary | ICD-10-CM

## 2017-02-03 DIAGNOSIS — Z853 Personal history of malignant neoplasm of breast: Secondary | ICD-10-CM | POA: Diagnosis not present

## 2017-02-03 DIAGNOSIS — Z833 Family history of diabetes mellitus: Secondary | ICD-10-CM

## 2017-02-03 DIAGNOSIS — R0600 Dyspnea, unspecified: Secondary | ICD-10-CM

## 2017-02-03 DIAGNOSIS — R55 Syncope and collapse: Secondary | ICD-10-CM

## 2017-02-03 DIAGNOSIS — I871 Compression of vein: Secondary | ICD-10-CM | POA: Diagnosis present

## 2017-02-03 DIAGNOSIS — R911 Solitary pulmonary nodule: Secondary | ICD-10-CM | POA: Diagnosis not present

## 2017-02-03 DIAGNOSIS — R519 Headache, unspecified: Secondary | ICD-10-CM

## 2017-02-03 DIAGNOSIS — R918 Other nonspecific abnormal finding of lung field: Secondary | ICD-10-CM | POA: Diagnosis not present

## 2017-02-03 DIAGNOSIS — Z8673 Personal history of transient ischemic attack (TIA), and cerebral infarction without residual deficits: Secondary | ICD-10-CM | POA: Diagnosis not present

## 2017-02-03 DIAGNOSIS — I4581 Long QT syndrome: Secondary | ICD-10-CM | POA: Diagnosis present

## 2017-02-03 DIAGNOSIS — Z923 Personal history of irradiation: Secondary | ICD-10-CM

## 2017-02-03 DIAGNOSIS — Z950 Presence of cardiac pacemaker: Secondary | ICD-10-CM

## 2017-02-03 DIAGNOSIS — R9389 Abnormal findings on diagnostic imaging of other specified body structures: Secondary | ICD-10-CM

## 2017-02-03 DIAGNOSIS — C8512 Unspecified B-cell lymphoma, intrathoracic lymph nodes: Principal | ICD-10-CM | POA: Diagnosis present

## 2017-02-03 DIAGNOSIS — M170 Bilateral primary osteoarthritis of knee: Secondary | ICD-10-CM | POA: Diagnosis present

## 2017-02-03 DIAGNOSIS — I480 Paroxysmal atrial fibrillation: Secondary | ICD-10-CM

## 2017-02-03 DIAGNOSIS — I495 Sick sinus syndrome: Secondary | ICD-10-CM | POA: Diagnosis present

## 2017-02-03 DIAGNOSIS — R531 Weakness: Secondary | ICD-10-CM | POA: Diagnosis not present

## 2017-02-03 DIAGNOSIS — Z8249 Family history of ischemic heart disease and other diseases of the circulatory system: Secondary | ICD-10-CM

## 2017-02-03 DIAGNOSIS — Z79899 Other long term (current) drug therapy: Secondary | ICD-10-CM

## 2017-02-03 DIAGNOSIS — Z90710 Acquired absence of both cervix and uterus: Secondary | ICD-10-CM

## 2017-02-03 DIAGNOSIS — Z8701 Personal history of pneumonia (recurrent): Secondary | ICD-10-CM

## 2017-02-03 DIAGNOSIS — J9859 Other diseases of mediastinum, not elsewhere classified: Secondary | ICD-10-CM | POA: Diagnosis not present

## 2017-02-03 DIAGNOSIS — I472 Ventricular tachycardia: Secondary | ICD-10-CM | POA: Diagnosis present

## 2017-02-03 DIAGNOSIS — Z9889 Other specified postprocedural states: Secondary | ICD-10-CM

## 2017-02-03 DIAGNOSIS — E890 Postprocedural hypothyroidism: Secondary | ICD-10-CM | POA: Diagnosis present

## 2017-02-03 DIAGNOSIS — Z6836 Body mass index (BMI) 36.0-36.9, adult: Secondary | ICD-10-CM | POA: Diagnosis not present

## 2017-02-03 DIAGNOSIS — K7689 Other specified diseases of liver: Secondary | ICD-10-CM | POA: Diagnosis present

## 2017-02-03 DIAGNOSIS — E785 Hyperlipidemia, unspecified: Secondary | ICD-10-CM | POA: Diagnosis present

## 2017-02-03 DIAGNOSIS — Z87442 Personal history of urinary calculi: Secondary | ICD-10-CM | POA: Diagnosis not present

## 2017-02-03 DIAGNOSIS — Z9049 Acquired absence of other specified parts of digestive tract: Secondary | ICD-10-CM

## 2017-02-03 DIAGNOSIS — R222 Localized swelling, mass and lump, trunk: Secondary | ICD-10-CM | POA: Diagnosis not present

## 2017-02-03 DIAGNOSIS — C383 Malignant neoplasm of mediastinum, part unspecified: Secondary | ICD-10-CM | POA: Diagnosis not present

## 2017-02-03 DIAGNOSIS — Z888 Allergy status to other drugs, medicaments and biological substances status: Secondary | ICD-10-CM

## 2017-02-03 DIAGNOSIS — E669 Obesity, unspecified: Secondary | ICD-10-CM | POA: Diagnosis present

## 2017-02-03 DIAGNOSIS — I4891 Unspecified atrial fibrillation: Secondary | ICD-10-CM | POA: Diagnosis not present

## 2017-02-03 DIAGNOSIS — Z9884 Bariatric surgery status: Secondary | ICD-10-CM | POA: Diagnosis not present

## 2017-02-03 DIAGNOSIS — Z96651 Presence of right artificial knee joint: Secondary | ICD-10-CM | POA: Diagnosis present

## 2017-02-03 DIAGNOSIS — E039 Hypothyroidism, unspecified: Secondary | ICD-10-CM | POA: Diagnosis present

## 2017-02-03 DIAGNOSIS — C8519 Unspecified B-cell lymphoma, extranodal and solid organ sites: Secondary | ICD-10-CM | POA: Diagnosis not present

## 2017-02-03 DIAGNOSIS — R0602 Shortness of breath: Secondary | ICD-10-CM | POA: Diagnosis not present

## 2017-02-03 DIAGNOSIS — Z86010 Personal history of colonic polyps: Secondary | ICD-10-CM | POA: Diagnosis not present

## 2017-02-03 LAB — BRAIN NATRIURETIC PEPTIDE: B Natriuretic Peptide: 70.5 pg/mL (ref 0.0–100.0)

## 2017-02-03 LAB — CBC
HCT: 44.1 % (ref 36.0–46.0)
HEMOGLOBIN: 13.7 g/dL (ref 12.0–15.0)
MCH: 28 pg (ref 26.0–34.0)
MCHC: 31.1 g/dL (ref 30.0–36.0)
MCV: 90.2 fL (ref 78.0–100.0)
Platelets: 227 10*3/uL (ref 150–400)
RBC: 4.89 MIL/uL (ref 3.87–5.11)
RDW: 14.5 % (ref 11.5–15.5)
WBC: 7.8 10*3/uL (ref 4.0–10.5)

## 2017-02-03 LAB — BASIC METABOLIC PANEL
ANION GAP: 10 (ref 5–15)
BUN: 21 mg/dL — ABNORMAL HIGH (ref 6–20)
CO2: 26 mmol/L (ref 22–32)
Calcium: 9.6 mg/dL (ref 8.9–10.3)
Chloride: 105 mmol/L (ref 101–111)
Creatinine, Ser: 1.09 mg/dL — ABNORMAL HIGH (ref 0.44–1.00)
GFR calc Af Amer: 57 mL/min — ABNORMAL LOW (ref >60)
GFR, EST NON AFRICAN AMERICAN: 49 mL/min — AB (ref >60)
GLUCOSE: 100 mg/dL — AB (ref 65–99)
POTASSIUM: 4.4 mmol/L (ref 3.5–5.1)
Sodium: 141 mmol/L (ref 135–145)

## 2017-02-03 LAB — PROTIME-INR
INR: 2.06
Prothrombin Time: 23.5 seconds — ABNORMAL HIGH (ref 11.4–15.2)

## 2017-02-03 MED ORDER — ONDANSETRON HCL 4 MG/2ML IJ SOLN
4.0000 mg | Freq: Four times a day (QID) | INTRAMUSCULAR | Status: DC | PRN
  Filled 2017-02-03: qty 2

## 2017-02-03 MED ORDER — ACETAMINOPHEN 650 MG RE SUPP: 650.0000 mg | Freq: Four times a day (QID) | RECTAL | Status: DC | PRN

## 2017-02-03 MED ORDER — AMIODARONE IV BOLUS ONLY 150 MG/100ML
150.0000 mg | Freq: Once | INTRAVENOUS | Status: AC
  Administered 2017-02-03: 150 mg via INTRAVENOUS
  Filled 2017-02-03: qty 100

## 2017-02-03 MED ORDER — LEVOTHYROXINE SODIUM 175 MCG PO TABS
175.0000 ug | ORAL_TABLET | ORAL | Status: DC
  Administered 2017-02-04 – 2017-02-05 (×2): 175 ug via ORAL
  Filled 2017-02-03 (×2): qty 1

## 2017-02-03 MED ORDER — CYCLOSPORINE 0.05 % OP EMUL
1.0000 [drp] | Freq: Two times a day (BID) | OPHTHALMIC | Status: DC
  Administered 2017-02-03 – 2017-02-08 (×9): 1 [drp] via OPHTHALMIC
  Filled 2017-02-03 (×10): qty 1

## 2017-02-03 MED ORDER — DIGOXIN 0.25 MG/ML IJ SOLN
0.2500 mg | Freq: Once | INTRAMUSCULAR | Status: AC
  Administered 2017-02-03: 0.25 mg via INTRAVENOUS
  Filled 2017-02-03: qty 2

## 2017-02-03 MED ORDER — DILTIAZEM LOAD VIA INFUSION
20.0000 mg | Freq: Once | INTRAVENOUS | Status: AC
  Administered 2017-02-03: 20 mg via INTRAVENOUS
  Filled 2017-02-03: qty 20

## 2017-02-03 MED ORDER — IOPAMIDOL (ISOVUE-300) INJECTION 61%
INTRAVENOUS | Status: AC
  Administered 2017-02-03: 75 mL
  Filled 2017-02-03: qty 75

## 2017-02-03 MED ORDER — FUROSEMIDE 10 MG/ML IJ SOLN: 40.0000 mg | Freq: Once | INTRAMUSCULAR | Status: DC

## 2017-02-03 MED ORDER — ONDANSETRON HCL 4 MG PO TABS: 4.0000 mg | ORAL_TABLET | Freq: Four times a day (QID) | ORAL | Status: DC | PRN

## 2017-02-03 MED ORDER — LEVOTHYROXINE SODIUM 75 MCG PO TABS
87.5000 ug | ORAL_TABLET | ORAL | Status: DC
  Administered 2017-02-07: 87.5 ug via ORAL
  Filled 2017-02-03: qty 1

## 2017-02-03 MED ORDER — SODIUM CHLORIDE 0.9 % IV BOLUS (SEPSIS)
500.0000 mL | Freq: Once | INTRAVENOUS | Status: AC
  Administered 2017-02-03: 500 mL via INTRAVENOUS

## 2017-02-03 MED ORDER — ACETAMINOPHEN 325 MG PO TABS
650.0000 mg | ORAL_TABLET | Freq: Four times a day (QID) | ORAL | Status: DC | PRN
  Administered 2017-02-04 (×2): 650 mg via ORAL
  Filled 2017-02-03 (×2): qty 2

## 2017-02-03 MED ORDER — ACETAMINOPHEN 500 MG PO TABS
1000.0000 mg | ORAL_TABLET | Freq: Once | ORAL | Status: AC
  Administered 2017-02-03: 1000 mg via ORAL
  Filled 2017-02-03: qty 2

## 2017-02-03 MED ORDER — SODIUM CHLORIDE 0.9% FLUSH
3.0000 mL | Freq: Two times a day (BID) | INTRAVENOUS | Status: DC
  Administered 2017-02-04 – 2017-02-07 (×5): 3 mL via INTRAVENOUS

## 2017-02-03 MED ORDER — AMIODARONE HCL 200 MG PO TABS
400.0000 mg | ORAL_TABLET | Freq: Three times a day (TID) | ORAL | Status: DC
  Administered 2017-02-03 – 2017-02-08 (×14): 400 mg via ORAL
  Filled 2017-02-03 (×15): qty 2

## 2017-02-03 MED ORDER — DILTIAZEM HCL-DEXTROSE 100-5 MG/100ML-% IV SOLN (PREMIX)
5.0000 mg/h | INTRAVENOUS | Status: DC
  Administered 2017-02-03: 5 mg/h via INTRAVENOUS
  Filled 2017-02-03: qty 100

## 2017-02-03 MED ORDER — LIFITEGRAST 5 % OP SOLN: 1.0000 [drp] | Freq: Two times a day (BID) | OPHTHALMIC | Status: DC

## 2017-02-03 NOTE — ED Notes (Signed)
Attempted report x1. 

## 2017-02-03 NOTE — H&P (Deleted)
ELECTROPHYSIOLOGY History and Physical  NOTE    Patient ID: Kayla Harrison MRN: [id number], DOB/AGE: 01-25-1946 58 y.o.  Admit date: 02/03/2017 Date of Consult: 02/03/2017   Primary Physician: [name], MD Primary Cardiologist: Dr. Caryl Comes  Reason for Consultation: AFib  HPI: Kayla Harrison is a 71 y.o. female with PMHx of Paroxysmal AFib, PVI ablation at [facility] in 2009, with recurrent AF underwent convergent Ablation at [facility] in 3149 complicated by heart block requiring PPM, she has been intolerant of OACs and maintained on warfarin for a/c.  Hypothyroid s/p thyroidectomy, HLD, OA/arthritis, CVA She last saw Dr. Caryl Comes only last month with reports of significant personal stressors noting oncreasing AF burden and started on Bisoprolol.  She came to the ER today with c/o feeling dizzy/lightheaded and somewhat SOB for a couple weeks with occassional palpitations.  The patient reports feeling pretty "lousy" for several weeks, her SOB, or need to take in deep breaths/sighs dates back perhaps years.  In the last week though increasingly SOB and in the last few days she describes nighttime SOB worse when supine, better with a few pillows, she also describe facial and upper extremity swelling b/l, no LE swelling.  She has been having palpitations on/off for weeks/months.  The last few days periods of weakness like she feels if worse could faint, no syncope.   LABS: K+ 4.4 BUN/Creat 21/1.09 WBC 7.8 H/H 13/44 plts 227 INR 2.06  CXr notes Abnormality right paratracheal region suspicious for mass. Vascular abnormality secondary consideration.  Past Medical History:  Diagnosis Date  . Arthritis    osteoarthritis - knees   . Atrial fibrillation [facility])    ablation- 2x's-- 1st time- [facility], 2nd event at [facility] in 2008. Convergent ablation at [facility] 6/14  . Breast cancer ([place])    Dr Margot Chimes, total thyroidectomy- 1999- for cancer  . Colon polyp    Dr Earlean Shawl  . Complication of  anesthesia    Ketamine produces LSD reaction, bright colored nightmarish experience   . Dyslipidemia   . Dysrhythmia    a fib with internal/external ablation, currently NSR  . Hepatitis    [place]patitis   . Hx of thyroid cancer    Dr Forde Dandy  . Hyperlipidemia   . Hypothyroidism   . Morbid obesity ([place])    Status post lap band surgery  . Normal cardiac stress test    2008- cardiac stress/echo  . Pacemaker-Medtronic   . Pneumonia 2010   [facility]- during that event had to be cardioverted   . Renal calculi   . Sinus node dysfunction ([place])    Complicating convergent ablation 6/14  . Stroke [facility])    2003- [place]      Surgical History:  Past Surgical History:  Procedure Laterality Date  . ABDOMINAL HYSTERECTOMY  1983  . afib ablation     X 2; [facility] & Dr Caryl Comes  . APPENDECTOMY    . BREAST LUMPECTOMY  2010  . bso  1998  . CARDIOVERSION  10/09/2012   Procedure: CARDIOVERSION;  Surgeon: Minus Breeding, MD;  Location: [place];  Service: Cardiovascular;  Laterality: N/A;  . CARDIOVERSION  10/09/2012   Procedure: CARDIOVERSION;  Surgeon: Minus Breeding, MD;  Location: [facility] ENDOSCOPY;  Service: Cardiovascular;  Laterality: N/A;  [name] gave the ok to add pt to the add on , but we must check to find out if the can add pt on at [phone] ( [phone])  . CARDIOVERSION N/A  11/20/2012   Procedure: CARDIOVERSION;  Surgeon: Fay Records, MD;  Location: [facility] ENDOSCOPY;  Service: Cardiovascular;  Laterality: N/A;  . CHOLECYSTECTOMY    . COLONOSCOPY W/ POLYPECTOMY     Dr Earlean Shawl  . convergent    . CYSTOSCOPY N/A 02/06/2015   Procedure: CYSTOSCOPY;  Surgeon: Kathie Rhodes, MD;  Location: [facility] ORS;  Service: Urology;  Laterality: N/A;  . CYSTOSCOPY WITH RETROGRADE PYELOGRAM, URETEROSCOPY AND STENT PLACEMENT Right 02/06/2015   Procedure: RETROGRADE PYELOGRAM, RIGHT URETEROSCOPY STENT PLACEMENT;  Surgeon: Kathie Rhodes, MD;  Location: [facility] ORS;  Service: Urology;  Laterality: Right;  . EYE  SURGERY     cataract surgery  . HOLMIUM LASER APPLICATION N/A 2/68/3419   Procedure: HOLMIUM LASER APPLICATION;  Surgeon: Kathie Rhodes, MD;  Location: [facility] ORS;  Service: Urology;  Laterality: N/A;  . JOINT REPLACEMENT  04/13/12   Right Knee replacement  . KNEE ARTHROSCOPY     bilateral  . LAPAROSCOPIC GASTRIC BANDING  07/10/2010  . LAPAROTOMY     for ruptured ovary and ovarian artery   . PACEMAKER INSERTION  03/10/13   [facility]  . POCKET REVISION N/A 12/08/2013   Procedure: POCKET REVISION;  Surgeon: Deboraha Sprang, MD;  Location: [facility] CATH LAB;  Service: Cardiovascular;  Laterality: N/A;  . THYROIDECTOMY  1998   Dr Margot Chimes  . TONSILLECTOMY    . TOTAL KNEE ARTHROPLASTY  04/13/2012   Procedure: TOTAL KNEE ARTHROPLASTY;  Surgeon: Rudean Haskell, MD;  Location: [place];  Service: Orthopedics;  Laterality: Right;  . Transabdominal ablation  03/02/13   [facility]      (Not in a hospital admission)  Inpatient Medications:   Allergies:  Allergies  Allergen Reactions  . Tikosyn [Dofetilide]     Prolonged QT interval Prolonged QT interval  . Xarelto [Rivaroxaban]     Nose Bleed X 6 hrs ; packing in ER  . Epinephrine Other (See Comments) and Rash    Dental form only (liquid). Patient stated she will become "out of it" she can hear you but cannot respond in normal fashion.  Marland Kitchen Ketamine     Hallucinations  . Eliquis [Apixaban] Other (See Comments)    BLEEDING IN URINE    Social History   Social History  . Marital status: Single    Spouse name: N/A  . Number of children: 0  . Years of education: N/A   Occupational History  . [employer] Retired   Social History Main Topics  . Smoking status: Never Smoker  . Smokeless tobacco: Never Used  . Alcohol use No  . Drug use: No  . Sexual activity: Not on file   Other Topics Concern  . Not on file   Social History Narrative   SINGLE    NO CHILDREN   NEVER SMOKED   EXERCISE 3 X WK           Family  History  Problem Relation Age of Onset  . Cancer Father        COLON  . Heart disease Father 65       MI '@autopsy'$   . Diabetes Sister   . Diabetes Brother   . Diabetes Paternal Aunt   . Diabetes Paternal Grandmother      Review of Systems: All other systems reviewed and are otherwise negative except as noted above.  Physical Exam: Vitals:   02/03/17 1100 02/03/17 1130 02/03/17 1200 02/03/17 1210  BP: (!) 152/108 (!) 152/108 (!) 152/108   Pulse: Marland Kitchen)  120 (!) 154 (!) 146 (!) 131  Resp: '18 15 15 '$ (!) 21  Temp:      TempSrc:      SpO2: 97% 98% 96% 99%  Weight:      Height:        GEN- The patient is well appearing, alert and oriented x 3 today.   HEENT: normocephalic, atraumatic; sclera clear, conjunctiva pink; hearing intact; oropharynx clear; neck supple, no JVD is appreciated Lymph- no cervical lymphadenopathy Lungs- CTA, normal work of breathing.  No wheezes, rales, rhonchi Heart-IRRR, tachycardic, no murmurs, rubs or gallops, PMI not laterally displaced GI- soft, non-tender, non-distended Extremities- no clubbing, cyanosis, or edema MS- no significant deformity, age appropriate atrophy Skin- warm and dry, no rash or lesion Psych- euthymic mood, full affect Neuro- no gross deficits observed  Labs:   Lab Results  Component Value Date   WBC 7.8 02/03/2017   HGB 13.7 02/03/2017   HCT 44.1 02/03/2017   MCV 90.2 02/03/2017   PLT 227 02/03/2017    Recent Labs Lab 02/03/17 0947  NA 141  K 4.4  CL 105  CO2 26  BUN 21*  CREATININE 1.09*  CALCIUM 9.6  GLUCOSE 100*      Radiology/Studies:  Dg Chest 2 View Result Date: 02/03/2017 CLINICAL DATA:  71 year old female with increased shortness breath over the past 5-6 days. Thyroid cancer. Breast cancer. Initial encounter. EXAM: CHEST  2 VIEW COMPARISON:  06/30/2015 chest x-ray FINDINGS: Abnormality right paratracheal region suspicious for mass. Vascular abnormality secondary consideration. Chest CT recommended for  further evaluation. Post thyroidectomy. Prior clipping left atrium. Sequential pacemaker in place. Heart size within normal limits. No infiltrate, congestive heart failure or pneumothorax. Erosion distal right clavicle possibly degenerative in origin. Calcified aorta. IMPRESSION: Abnormality right paratracheal region suspicious for mass. Vascular abnormality secondary consideration. Chest CT recommended for further evaluation. Post thyroidectomy and breast surgery. Prior clipping left atrium. Sequential pacemaker in place. Heart size within normal limits. No infiltrate or congestive heart failure. Erosion distal right clavicle possibly degenerative in origin. These results were called by telephone at the time of interpretation on 02/03/2017 at 10:11 am to Dr. Lajean Saver , who verbally acknowledged these results. Electronically Signed   By: Genia Del M.D.   On: 02/03/2017 10:15   Reviewed by myself: EKG:SR, 90bpm, PVC, PR 125m, QRS 755m QTc 459  TELEMETRY: AFib 120's-130  03/30/13: TTE Study Conclusions - Left ventricle: The cavity size was normal. Wall thickness was normal. Systolic function was normal. The estimated ejection fraction was in the range of 55% to 60%. Wall motion was normal; there were no regional wall motion abnormalities. Doppler parameters are consistent with high ventricular filling pressure. - Mitral valve: Moderately calcified annulus. Normal thickness leaflets . Mild regurgitation. Mean gradient: 43m74mg (D). Peak gradient: 68m2m (D). Valve area by continuity equation (using LVOT flow): 1.28cm^2. - Left atrium: The atrium was mildly dilated. - Right atrium: The atrium was mildly dilated. - Pulmonary arteries: PA peak pressure: 31mm743m(S). - Pericardium, extracardiac: There was a left pleural effusion.    Assessment and Plan:   1. SOB     There is a chronic component, her CXR findings are noted and await CT results, and an unusual upper  extremity/facial swelling     Pending CT, may ask medicine to admit     Her newer c/o SOB sounds of PND, although CXR does not support this, she took '40mg'$  PO lasix yesterday and reports lost 4lbs  IV lasix then PO, will check BNP  2. Paroxysmal AFib     CHA2DS2Vasc is at lest 4, on warfarin     She had QT prolongation with Tikosyn historically     INR is therapeutic, she arrived in SR, will change cardizem to amiodarone     The patient reports her thyroid replacement has been stable, managed with her PMD.  3. PPM     carelink transmission reviewed, intact pacer function, battery is stable     NSVT appears rapid AFib    Await CT scan for admission decisions          Signed, Tommye Standard, PA-C 02/03/2017 12:42 PM   Patient presents with intermittent but increasingly frequent atrial fibrillation with a rapid rate, progressive orthopnea and nocturnal dyspnea but also accompanied by swelling of the face mouth internally and upper extremities without lower extremity edema  Physical examination is notable for JVP of only 7 or 8 and without peripheral edema. She has some bibasilar crackles. Heart rate is rapid. Facial swelling is not appreciated  ECG on arrival demonstrated sinus rhythm with a QTC of about 430 ms. This regard, it is notable that she failed that dofetilide in the past because of QT prolongation but tolerated amiodarone for long periods of time. The latter was stopped about a year or so ago because of infrequent atrial fibrillation  Assessment and plan  Shortness of breath/nocturnal dyspnea  Atrial fibrillation-paroxysmal with a history of catheter ablation at [facility] and hybrid- convergent procedure at [facility]  Pacemaker following convergent procedure for sinus node dysfunction-Medtronic  Abnormal chest x-ray  Treated hypothyroidism  Sensation of facial swelling  The cause of her shortness of breath is not clear.  Given its recombinant nature, it is likely cardiac.  This would not explain upper extremity and facial swelling however. In this regard the chest x-ray with parasternal shadows is concerning CT scan has been ordered and malignancy and other potential compression causes need to be excluded.  We will continue her on intravenous diuretics  Reason for atrial fibrillation rhythm control  Anticipate pulmonary consultation  Measure a albumin as well as a TSH and facial swelling.  Medicine consultation will be requested

## 2017-02-03 NOTE — Progress Notes (Signed)
Pt received from ED and oriented to room. Tele monitoring and skin assessment complete. Sisters present at bedside. HR elevated. PM amiodarone given, paged [facility] to make them aware. Bed alarm on, patient got up without assistance to the bathroom. Bedside commode at bedside, call light within reach.

## 2017-02-03 NOTE — Progress Notes (Signed)
Have erviewed CT with Dr  Maree Erie  and have told the patient about the mass which is likely malignant and likely lymphoma I havetold  Her that all of this is above my paygrade but that biopsy will be necessary prob transbronchial  Medicine will admit as they will be more effi=cient and driving the evaluaiton  We will be available  [phone]

## 2017-02-03 NOTE — ED Notes (Signed)
Patient transported to CT 

## 2017-02-03 NOTE — Telephone Encounter (Signed)
Pt c/o Shortness Of Breath: STAT if SOB developed within the last 24 hours or pt is noticeably SOB on the phone  1. Are you currently SOB (can you hear that pt is SOB on the phone)? no  2. How long have you been experiencing SOB? Past two weeks  3. Are you SOB when sitting or when up moving around? Laying down or bending down  4. Are you currently experiencing any other symptoms? Dizziness  Patient states that she "cant breathe, cant sleep and that her face is  Swollen, she believes it is the side effects of her new medication but she is not getting better. Thanks.

## 2017-02-03 NOTE — Telephone Encounter (Signed)
Pt states she has been experiencing swollen face and swollen eyelids for the past 3 weeks. She thought it may have been from her bisoprolol and she stopped that medication 6 days ago and her symptoms still have not went away. She says she has shortness of breath and sleeps with 2 pillows at night. She states she can't breath. She states she has been in A-fib for hours at a time and that her heart is "quivering" and she can feel it in her chest. Her HR since yesterday 02/02/17 has been between 80-120. BP 130/85. She states she has been dizzy and feels like she can faint at times. She says as long as she sits still, then she is okay. Pt wants an appointment as soon as possible with Dr. Caryl Comes or with one of the PA's. At first pt thought pollen and allergies was causing her symptoms, but she knows now  its not pollen. She would like to be seen very soon. Advised patient that with the symptoms she is experiencing she should go to the emergency room to be evaluated. She verbalized understanding and still insisted on being seen by a cardiologist. She says at the very moment while speaking on the phone, she was not short of breath. Educated patient it is not safe to be out if she feels like she is going to faint and she is dizzy, especially if she is short of breath. Told pt I would send Dr. Caryl Comes for further review and recommendation, and would send to scheduler and nurse.

## 2017-02-03 NOTE — ED Notes (Signed)
Pt c/o feeling dizzy, Cardizem gtt infusing. Pt states she took Lasix '40mg'$  po yesterday for increased fluid retention- states lost 4 lbs from yesterday to today.

## 2017-02-03 NOTE — ED Notes (Signed)
Pt states she has been having lightheadedness, and occasional syncopal episodes-- hx of ablation in [place] 4 years ago --  Pt has [employer] pacer-- sent to transmission to [employer] this am.

## 2017-02-03 NOTE — Telephone Encounter (Signed)
Pt to ED w/ c/o of face swelling and sob.

## 2017-02-03 NOTE — ED Provider Notes (Signed)
[place] DEPT Provider Note   CSN: [id number] Arrival date & time: 02/03/17  1448     History   Chief Complaint Chief Complaint  Patient presents with  . Weakness    HPI Kayla Harrison is a 71 y.o. female.  Patient c/o feeling generally tired, dizzy/lightheaded, for the pat few weeks.  Also has felt persistently sob for past few weeks.  Symptoms constant, persistent, slowly worse. Also notes hx afib, and intermittently palpitations. Denies chest pain. States compliant w normal meds.  Denies cough or uri c/o. No fever or chills.    The history is provided by the patient.  Chest Pain   Associated symptoms include palpitations and shortness of breath. Pertinent negatives include no abdominal pain, no back pain, no cough, no fever, no headaches and no vomiting.    Past Medical History:  Diagnosis Date  . Arthritis    osteoarthritis - knees   . Atrial fibrillation [facility])    ablation- 2x's-- 1st time- [facility], 2nd event at [facility] in 2008. Convergent ablation at [facility] 6/14  . Breast cancer ([place])    Dr Margot Chimes, total thyroidectomy- 1999- for cancer  . Colon polyp    Dr Earlean Shawl  . Complication of anesthesia    Ketamine produces LSD reaction, bright colored nightmarish experience   . Dyslipidemia   . Dysrhythmia    a fib with internal/external ablation, currently NSR  . Hepatitis    [place]patitis   . Hx of thyroid cancer    Dr Forde Dandy  . Hyperlipidemia   . Hypothyroidism   . Morbid obesity ([place])    Status post lap band surgery  . Normal cardiac stress test    2008- cardiac stress/echo  . Pacemaker-[employer]   . Pneumonia 2010   [facility]- during that event had to be cardioverted   . Renal calculi   . Sinus node dysfunction ([place])    Complicating convergent ablation 6/14  . Stroke [facility])    2003- [place]     Patient Active Problem List   Diagnosis Date Noted  . OSA (obstructive sleep apnea) 02/03/2015  . History of renal  calculi 11/16/2014  . Renal calculi 11/16/2014  . Family history of colon cancer 10/26/2014  . Encounter for therapeutic drug monitoring 11/24/2013  . Mechanical complication due to cardiac pacemaker pulse generator 11/06/2013  . Dyspnea 05/12/2013  . Unspecified hypothyroidism 04/21/2013  . Pleural effusion 04/19/2013  . Long term (current) use of anticoagulants 12/20/2010  . EPIDERMOID CYST 08/22/2010  . CHRONIC DIASTOLIC HEART FAILURE 18/56/3149  . SYNCOPE AND COLLAPSE 07/27/2010  . OSTEOARTHRITIS, KNEE, RIGHT 03/15/2010  . Morbid obesity ([place]) 12/07/2009  . NONSPEC ELEVATION OF LEVELS OF TRANSAMINASE/LDH 09/08/2009  . CVA 08/14/2009  . BREAST CANCER, HX OF 07/25/2009  . COLONIC POLYPS, HX OF 07/25/2009  . TUBULOVILLOUS ADENOMA, COLON 04/29/2008  . HYPERGLYCEMIA, FASTING 04/29/2008  . HYPERLIPIDEMIA 06/22/2007  . Atrial fibrillation ([place]) 06/22/2007  . CARCINOMA, THYROID GLAND, HX OF 06/22/2007    Past Surgical History:  Procedure Laterality Date  . ABDOMINAL HYSTERECTOMY  1983  . afib ablation     X 2; [facility] & Dr Caryl Comes  . APPENDECTOMY    . BREAST LUMPECTOMY  2010  . bso  1998  . CARDIOVERSION  10/09/2012   Procedure: CARDIOVERSION;  Surgeon: Minus Breeding, MD;  Location: [place];  Service: Cardiovascular;  Laterality: N/A;  . CARDIOVERSION  10/09/2012   Procedure: CARDIOVERSION;  Surgeon: Minus Breeding, MD;  Location: [place] ENDOSCOPY;  Service: Cardiovascular;  Laterality: N/A;  [name] gave the ok to add pt to the add on , but we must check to find out if the can add pt on at [phone] ( [phone])  . CARDIOVERSION N/A 11/20/2012   Procedure: CARDIOVERSION;  Surgeon: Fay Records, MD;  Location[facility]COPY;  Service: Cardiovascular;  Laterality: N/A;  . CHOLECYSTECTOMY    . COLONOSCOPY W/ POLYPECTOMY     Dr Earlean Shawl  . convergent    . CYSTOSCOPY N/A 02/06/2015   Procedure: CYSTOSCOPY;  Surgeon: Kathie Rhodes, MD;  Location: [facility];  Service: Urology;  Laterality: N/A;  . CYSTOSCOPY WITH  RETROGRADE PYELOGRAM, URETEROSCOPY AND STENT PLACEMENT Right 02/06/2015   Procedure: RETROGRADE PYELOGRAM, RIGHT URETEROSCOPY STENT PLACEMENT;  Surgeon: Kathie Rhodes, MD;  Location: [facility];  Service: Urology;  Laterality: Right;  . EYE SURGERY     cataract surgery  . HOLMIUM LASER APPLICATION N/A 0/27/2536   Procedure: HOLMIUM LASER APPLICATION;  Surgeon: Kathie Rhodes, MD;  Location: [facility];  Service: Urology;  Laterality: N/A;  . JOINT REPLACEMENT  04/13/12   Right Knee replacement  . KNEE ARTHROSCOPY     bilateral  . LAPAROSCOPIC GASTRIC BANDING  07/10/2010  . LAPAROTOMY     for ruptured ovary and ovarian artery   . PACEMAKER INSERTION  03/10/13   [facility]  . POCKET REVISION N/A 12/08/2013   Procedure: POCKET REVISION;  Surgeon: Deboraha Sprang, MD;  Location: [facility] CATH LAB;  Service: Cardiovascular;  Laterality: N/A;  . THYROIDECTOMY  1998   Dr Margot Chimes  . TONSILLECTOMY    . TOTAL KNEE ARTHROPLASTY  04/13/2012   Procedure: TOTAL KNEE ARTHROPLASTY;  Surgeon: Rudean Haskell, MD;  Location: [place];  Service: Orthopedics;  Laterality: Right;  . Transabdominal ablation  03/02/13   [facility]    OB History    No data available       Home Medications    Prior to Admission medications   Medication Sig Start Date End Date Taking? Authorizing Provider  bisoprolol (ZEBETA) 5 MG tablet Take 1/2 tablet (2.5 mg) by mouth once daily 12/25/16  Yes Deboraha Sprang, MD  cycloSPORINE (RESTASIS) 0.05 % ophthalmic emulsion Place 1 drop into both eyes 2 (two) times daily.   Yes [provider]  furosemide (LASIX) 40 MG tablet Take 40 mg by mouth daily as needed for fluid.    Yes [provider]  levothyroxine (SYNTHROID, LEVOTHROID) 175 MCG tablet Take 87.5-175 mcg by mouth daily before breakfast. TAKE 1 TABLET BY MOUTH EVERY DAY EXCEPT, TAKE 1/2 TABLET ON Friday AND NO TABLET ON Saturday   Yes [provider]  Lifitegrast Shirley Friar) 5 % SOLN Apply 1 drop to eye 2 (two) times daily.   Yes  [provider]  Multiple Vitamin (MULTIVITAMIN) tablet Take 1 tablet by mouth daily.    Yes [provider]  warfarin (COUMADIN) 5 MG tablet TAKE AS DIRECTED BY  COUMADIN CLINIC 10/07/16  Yes Deboraha Sprang, MD    Family History Family History  Problem Relation Age of Onset  . Cancer Father        COLON  . Heart disease Father 3       MI '@autopsy'$   . Diabetes Sister   . Diabetes Brother   . Diabetes Paternal Aunt   . Diabetes Paternal Grandmother     Social History Social History  Substance Use Topics  . Smoking status: Never Smoker  . Smokeless tobacco:  Never Used  . Alcohol use No     Allergies   Tikosyn [dofetilide]; Xarelto [rivaroxaban]; Epinephrine; Ketamine; and Eliquis [apixaban]   Review of Systems Review of Systems  Constitutional: Negative for chills and fever.  HENT: Negative for sore throat.   Eyes: Negative for redness.  Respiratory: Positive for shortness of breath. Negative for cough.   Cardiovascular: Positive for palpitations. Negative for chest pain and leg swelling.  Gastrointestinal: Negative for abdominal pain and vomiting.  Genitourinary: Negative for flank pain.  Musculoskeletal: Negative for back pain and neck pain.  Skin: Negative for rash.  Neurological: Positive for light-headedness. Negative for headaches.  Hematological: Does not bruise/bleed easily.  Psychiatric/Behavioral: Negative for confusion.     Physical Exam Updated Vital Signs BP (!) 152/108   Pulse (!) 154   Temp 97.1 F (36.2 C) (Oral)   Resp 15   Ht '5\' 6"'$  (1.676 m)   Wt 104.3 kg   SpO2 98%   BMI 37.12 kg/m   Physical Exam  Constitutional: She appears well-developed and well-nourished. No distress.  Eyes: Conjunctivae are normal. No scleral icterus.  Neck: Neck supple. No tracheal deviation present.  Cardiovascular: Normal rate, regular rhythm, normal heart sounds and intact distal pulses.  Exam reveals no gallop and no friction rub.   No  murmur heard. Pulmonary/Chest: Effort normal and breath sounds normal. No respiratory distress.  Abdominal: Soft. Normal appearance and bowel sounds are normal. She exhibits no distension. There is no tenderness.  Genitourinary:  Genitourinary Comments: No cva tenderness  Musculoskeletal: She exhibits no edema.  Neurological: She is alert.  Skin: Skin is warm and dry. No rash noted. She is not diaphoretic.  Psychiatric: She has a normal mood and affect.  Nursing note and vitals reviewed.    ED Treatments / Results  Labs (all labs ordered are listed, but only abnormal results are displayed) Results for orders placed or performed during the hospital encounter of 68/34/19  Basic metabolic panel  Result Value Ref Range   Sodium 141 135 - 145 mmol/L   Potassium 4.4 3.5 - 5.1 mmol/L   Chloride 105 101 - 111 mmol/L   CO2 26 22 - 32 mmol/L   Glucose, Bld 100 (H) 65 - 99 mg/dL   BUN 21 (H) 6 - 20 mg/dL   Creatinine, Ser 1.09 (H) 0.44 - 1.00 mg/dL   Calcium 9.6 8.9 - 10.3 mg/dL   GFR calc non Af Amer 49 (L) >60 mL/min   GFR calc Af Amer 57 (L) >60 mL/min   Anion gap 10 5 - 15  CBC  Result Value Ref Range   WBC 7.8 4.0 - 10.5 K/uL   RBC 4.89 3.87 - 5.11 MIL/uL   Hemoglobin 13.7 12.0 - 15.0 g/dL   HCT 44.1 36.0 - 46.0 %   MCV 90.2 78.0 - 100.0 fL   MCH 28.0 26.0 - 34.0 pg   MCHC 31.1 30.0 - 36.0 g/dL   RDW 14.5 11.5 - 15.5 %   Platelets 227 150 - 400 K/uL  Protime-INR  Result Value Ref Range   Prothrombin Time 23.5 (H) 11.4 - 15.2 seconds   INR 2.06    Dg Chest 2 View  Result Date: 02/03/2017 CLINICAL DATA:  71 year old female with increased shortness breath over the past 5-6 days. Thyroid cancer. Breast cancer. Initial encounter. EXAM: CHEST  2 VIEW COMPARISON:  06/30/2015 chest x-ray FINDINGS: Abnormality right paratracheal region suspicious for mass. Vascular abnormality secondary consideration. Chest CT recommended for further  evaluation. Post thyroidectomy. Prior clipping  left atrium. Sequential pacemaker in place. Heart size within normal limits. No infiltrate, congestive heart failure or pneumothorax. Erosion distal right clavicle possibly degenerative in origin. Calcified aorta. IMPRESSION: Abnormality right paratracheal region suspicious for mass. Vascular abnormality secondary consideration. Chest CT recommended for further evaluation. Post thyroidectomy and breast surgery. Prior clipping left atrium. Sequential pacemaker in place. Heart size within normal limits. No infiltrate or congestive heart failure. Erosion distal right clavicle possibly degenerative in origin. These results were called by telephone at the time of interpretation on 02/03/2017 at 10:11 am to Dr. Lajean Saver , who verbally acknowledged these results. Electronically Signed   By: Genia Del M.D.   On: 02/03/2017 10:15    EKG  EKG Interpretation  Date/Time:  Monday Feb 03 2017 09:45:21 EDT Ventricular Rate:  90 PR Interval:  130 QRS Duration: 76 QT Interval:  376 QTC Calculation: 459 R Axis:   62 Text Interpretation:  Sinus rhythm with occasional Premature ventricular complexes Otherwise normal ECG Confirmed by Ashok Cordia  MD, Lennette Bihari ([id number]) on 02/03/2017 10:23:57 AM       Radiology Dg Chest 2 View  Result Date: 02/03/2017 CLINICAL DATA:  71 year old female with increased shortness breath over the past 5-6 days. Thyroid cancer. Breast cancer. Initial encounter. EXAM: CHEST  2 VIEW COMPARISON:  06/30/2015 chest x-ray FINDINGS: Abnormality right paratracheal region suspicious for mass. Vascular abnormality secondary consideration. Chest CT recommended for further evaluation. Post thyroidectomy. Prior clipping left atrium. Sequential pacemaker in place. Heart size within normal limits. No infiltrate, congestive heart failure or pneumothorax. Erosion distal right clavicle possibly degenerative in origin. Calcified aorta. IMPRESSION: Abnormality right paratracheal region suspicious for mass.  Vascular abnormality secondary consideration. Chest CT recommended for further evaluation. Post thyroidectomy and breast surgery. Prior clipping left atrium. Sequential pacemaker in place. Heart size within normal limits. No infiltrate or congestive heart failure. Erosion distal right clavicle possibly degenerative in origin. These results were called by telephone at the time of interpretation on 02/03/2017 at 10:11 am to Dr. Lajean Saver , who verbally acknowledged these results. Electronically Signed   By: Genia Del M.D.   On: 02/03/2017 10:15    Procedures Procedures (including critical care time)  Medications Ordered in ED Medications  diltiazem (CARDIZEM) 1 mg/mL load via infusion 20 mg (20 mg Intravenous Bolus from Bag 02/03/17 1134)    And  diltiazem (CARDIZEM) 100 mg in dextrose 5% 171m (1 mg/mL) infusion (7.5 mg/hr Intravenous Rate/Dose Change 02/03/17 1148)     Initial Impression / Assessment and Plan / ED Course  I have reviewed the triage vital signs and the nursing notes.  Pertinent labs & imaging results that were available during my care of the patient were reviewed by me and considered in my medical decision making (see chart for details).  Iv ns. Continuous pulse ox and monitor. Labs.   Pt requests her cardiologist be contacted - cardiology paged - they will evaluate pt in ED.  In ED, pt goes into rapid afib.  cardizem bolus and gtt.  Reviewed nursing notes and prior charts for additional history.   Obtained pacemaker interrogation -  Appears multiple episodes rapid afib in recent past.   Cardiology with patient.   CT chest remains pending.     Final Clinical Impressions(s) / ED Diagnoses   Final diagnoses:  None    New Prescriptions New Prescriptions   No medications on file     SLajean Saver MD 02/03/17 1359

## 2017-02-03 NOTE — Progress Notes (Signed)
[place] for heparin Indication: atrial fibrillation  Heparin Dosing Weight: 83.2 kg   Assessment: 89 yof with hx of afib on warfarin PTA (EP notes state intolerant of DOACs). Pt with likely lymphoma and probably will need biopsy per MD notes. Pharmacy consulted to start heparin. Warfarin on hold. INR therapeutic at 2.06 on admit this AM - pt reports last dose taken today. Will f/u INR in AM to ensure not trending up prior to starting heparin since warfarin dose already taken today. CBC wnl. No bleed documented.  Goal of Therapy:  Heparin level 0.3-0.7 units/ml Monitor platelets by anticoagulation protocol: Yes   Plan:  Heparin when INR<2 Daily INR Monitor CBC, s/sx bleeding Warfarin on hold - f/u plans for biopsy this admit?  Elicia Lamp, PharmD, BCPS Clinical Pharmacist 02/03/2017 6:48 PM

## 2017-02-03 NOTE — ED Notes (Signed)
Pt's rate and rhythm have changed -- pt in AFib, rate in 140'S-- Dr. Ashok Cordia notified

## 2017-02-03 NOTE — ED Notes (Signed)
Admitting MD at bedside.

## 2017-02-03 NOTE — Consult Note (Addendum)
ELECTROPHYSIOLOGY CONSULT NOTE    Patient ID: Kayla Harrison MRN: [id number], DOB/AGE: 26-Jun-1946 71 y.o.  Admit date: 02/03/2017 Date of Consult: 02/03/2017   Primary Physician: [name], MD Primary Cardiologist: Dr. Caryl Comes  Reason for Consultation: AFib and dyspnea Consulted  Kayla Harrison is a 71 y.o. female who is being seen today for the evaluation Atrial fibrillation and shortness of breath* at the request of  Dr. Lajean Saver /emergency room   HPI: Kayla Harrison is a 71 y.o. female with PMHx of Paroxysmal AFib, PVI ablation at [facility] in 2009, with recurrent AF underwent convergent Ablation at [facility] in 2409 complicated by heart block requiring PPM, she has been intolerant of OACs and maintained on warfarin for a/c.  Hypothyroid s/p thyroidectomy, HLD, OA/arthritis, CVA She last saw Dr. Caryl Comes only last month with reports of significant personal stressors noting oncreasing AF burden and started on Bisoprolol.  She came to the ER today with c/o feeling dizzy/lightheaded and somewhat SOB for a couple weeks with occassional palpitations.  The patient reports feeling pretty "lousy" for several weeks, her SOB, or need to take in deep breaths/sighs dates back perhaps years.  In the last week though increasingly SOB and in the last few days she describes nighttime SOB worse when supine, better with a few pillows, she also describe facial and upper extremity swelling b/l, no LE swelling.  She has been having palpitations on/off for weeks/months.  The last few days periods of weakness like she feels if worse could faint, no syncope.   LABS: K+ 4.4 BUN/Creat 21/1.09 WBC 7.8 H/H 13/44 plts 227 INR 2.06  CXr notes Abnormality right paratracheal region suspicious for mass. Vascular abnormality secondary consideration.  Past Medical History:  Diagnosis Date  . Arthritis    osteoarthritis - knees   . Atrial fibrillation [facility])    ablation- 2x's-- 1st time- [facility],  2nd event at [facility] in 2008. Convergent ablation at [facility] 6/14  . Breast cancer ([place])    Dr Margot Chimes, total thyroidectomy- 1999- for cancer  . Colon polyp    Dr Earlean Shawl  . Complication of anesthesia    Ketamine produces LSD reaction, bright colored nightmarish experience   . Dyslipidemia   . Dysrhythmia    a fib with internal/external ablation, currently NSR  . Hepatitis    [place] ?hepatitis   . Hx of thyroid cancer    Dr Forde Dandy  . Hyperlipidemia   . Hypothyroidism   . Morbid obesity ([place])    Status post lap band surgery  . Normal cardiac stress test    2008- cardiac stress/echo  . Pacemaker-Medtronic   . Pneumonia 2010   [facility]- during that event had to be cardioverted   . Renal calculi   . Sinus node dysfunction ([place])    Complicating convergent ablation 6/14  . Stroke [facility])    2003- [place]      Surgical History:  Past Surgical History:  Procedure Laterality Date  . ABDOMINAL HYSTERECTOMY  1983  . afib ablation     X 2; [facility] & Dr Caryl Comes  . APPENDECTOMY    . BREAST LUMPECTOMY  2010  . bso  1998  . CARDIOVERSION  10/09/2012   Procedure: CARDIOVERSION;  Surgeon: Minus Breeding, MD;  Location: [place];  Service: Cardiovascular;  Laterality: N/A;  . CARDIOVERSION  10/09/2012   Procedure: CARDIOVERSION;  Surgeon: Minus Breeding, MD;  Location: [place];  Service: Cardiovascular;  Laterality: N/A;  [name] gave the ok to add pt to the add on , but we must check to find out if the can add pt on at [phone] ( [phone])  . CARDIOVERSION N/A 11/20/2012   Procedure: CARDIOVERSION;  Surgeon: Fay Records, MD;  Location: [facility] ENDOSCOPY;  Service: Cardiovascular;  Laterality: N/A;  . CHOLECYSTECTOMY    . COLONOSCOPY W/ POLYPECTOMY     Dr Earlean Shawl  . convergent    . CYSTOSCOPY N/A 02/06/2015   Procedure: CYSTOSCOPY;  Surgeon: Kathie Rhodes, MD;  Location: [facility];  Service: Urology;  Laterality: N/A;  . CYSTOSCOPY WITH RETROGRADE PYELOGRAM, URETEROSCOPY AND STENT  PLACEMENT Right 02/06/2015   Procedure: RETROGRADE PYELOGRAM, RIGHT URETEROSCOPY STENT PLACEMENT;  Surgeon: Kathie Rhodes, MD;  Location: [facility];  Service: Urology;  Laterality: Right;  . EYE SURGERY     cataract surgery  . HOLMIUM LASER APPLICATION N/A 1/61/0960   Procedure: HOLMIUM LASER APPLICATION;  Surgeon: Kathie Rhodes, MD;  Location: [facility];  Service: Urology;  Laterality: N/A;  . JOINT REPLACEMENT  04/13/12   Right Knee replacement  . KNEE ARTHROSCOPY     bilateral  . LAPAROSCOPIC GASTRIC BANDING  07/10/2010  . LAPAROTOMY     for ruptured ovary and ovarian artery   . PACEMAKER INSERTION  03/10/13   [facility]  . POCKET REVISION N/A 12/08/2013   Procedure: POCKET REVISION;  Surgeon: Deboraha Sprang, MD;  Location: [facility] CATH LAB;  Service: Cardiovascular;  Laterality: N/A;  . THYROIDECTOMY  1998   Dr Margot Chimes  . TONSILLECTOMY    . TOTAL KNEE ARTHROPLASTY  04/13/2012   Procedure: TOTAL KNEE ARTHROPLASTY;  Surgeon: Rudean Haskell, MD;  Location: [place];  Service: Orthopedics;  Laterality: Right;  . Transabdominal ablation  03/02/13   [facility]      (Not in a hospital admission)  Inpatient Medications:   Allergies:  Allergies  Allergen Reactions  . Tikosyn [Dofetilide]     Prolonged QT interval Prolonged QT interval  . Xarelto [Rivaroxaban]     Nose Bleed X 6 hrs ; packing in ER  . Epinephrine Other (See Comments) and Rash    Dental form only (liquid). Patient stated she will become "out of it" she can hear you but cannot respond in normal fashion.  Marland Kitchen Ketamine     Hallucinations  . Eliquis [Apixaban] Other (See Comments)    BLEEDING IN URINE    Social History   Social History  . Marital status: Single    Spouse name: N/A  . Number of children: 0  . Years of education: N/A   Occupational History  . [employer] Retired   Social History Main Topics  . Smoking status: Never Smoker  . Smokeless tobacco: Never Used  . Alcohol use No  . Drug use:  No  . Sexual activity: Not on file   Other Topics Concern  . Not on file   Social History Narrative   SINGLE    NO CHILDREN   NEVER SMOKED   EXERCISE 3 X WK           Family History  Problem Relation Age of Onset  . Cancer Father        COLON  . Heart disease Father 70       MI '@autopsy'$   . Diabetes Sister   . Diabetes Brother   . Diabetes Paternal Aunt   . Diabetes Paternal Grandmother      Review of Systems: All other systems reviewed  and are otherwise negative except as noted above.  Physical Exam: Vitals:   02/03/17 1100 02/03/17 1130 02/03/17 1200 02/03/17 1210  BP: (!) 152/108 (!) 152/108 (!) 152/108   Pulse: (!) 120 (!) 154 (!) 146 (!) 131  Resp: '18 15 15 '$ (!) 21  Temp:      TempSrc:      SpO2: 97% 98% 96% 99%  Weight:      Height:        GEN- The patient is well appearing, alert and oriented x 3 today.   HEENT: normocephalic, atraumatic; sclera clear, conjunctiva pink; hearing intact; oropharynx clear; neck supple, no JVD is appreciated Lymph- no cervical lymphadenopathy Lungs- CTA, normal work of breathing.  No wheezes, rales, rhonchi Heart-IRRR, tachycardic, no murmurs, rubs or gallops, PMI not laterally displaced GI- soft, non-tender, non-distended Extremities- no clubbing, cyanosis, or edema MS- no significant deformity, age appropriate atrophy Skin- warm and dry, no rash or lesion Psych- euthymic mood, full affect Neuro- no gross deficits observed  Labs:   Lab Results  Component Value Date   WBC 7.8 02/03/2017   HGB 13.7 02/03/2017   HCT 44.1 02/03/2017   MCV 90.2 02/03/2017   PLT 227 02/03/2017    Recent Labs Lab 02/03/17 0947  NA 141  K 4.4  CL 105  CO2 26  BUN 21*  CREATININE 1.09*  CALCIUM 9.6  GLUCOSE 100*      Radiology/Studies:  Dg Chest 2 View Result Date: 02/03/2017 CLINICAL DATA:  71 year old female with increased shortness breath over the past 5-6 days. Thyroid cancer. Breast cancer. Initial encounter. EXAM: CHEST   2 VIEW COMPARISON:  06/30/2015 chest x-ray FINDINGS: Abnormality right paratracheal region suspicious for mass. Vascular abnormality secondary consideration. Chest CT recommended for further evaluation. Post thyroidectomy. Prior clipping left atrium. Sequential pacemaker in place. Heart size within normal limits. No infiltrate, congestive heart failure or pneumothorax. Erosion distal right clavicle possibly degenerative in origin. Calcified aorta. IMPRESSION: Abnormality right paratracheal region suspicious for mass. Vascular abnormality secondary consideration. Chest CT recommended for further evaluation. Post thyroidectomy and breast surgery. Prior clipping left atrium. Sequential pacemaker in place. Heart size within normal limits. No infiltrate or congestive heart failure. Erosion distal right clavicle possibly degenerative in origin. These results were called by telephone at the time of interpretation on 02/03/2017 at 10:11 am to Dr. Lajean Saver , who verbally acknowledged these results. Electronically Signed   By: Genia Del M.D.   On: 02/03/2017 10:15   Reviewed by myself: EKG:SR, 90bpm, PVC, PR 163m, QRS 728m QTc 459  TELEMETRY: AFib 120's-130  03/30/13: TTE Study Conclusions - Left ventricle: The cavity size was normal. Wall thickness was normal. Systolic function was normal. The estimated ejection fraction was in the range of 55% to 60%. Wall motion was normal; there were no regional wall motion abnormalities. Doppler parameters are consistent with high ventricular filling pressure. - Mitral valve: Moderately calcified annulus. Normal thickness leaflets . Mild regurgitation. Mean gradient: 33m39mg (D). Peak gradient: 71m28m (D). Valve area by continuity equation (using LVOT flow): 1.28cm^2. - Left atrium: The atrium was mildly dilated. - Right atrium: The atrium was mildly dilated. - Pulmonary arteries: PA peak pressure: 31mm433m(S). - Pericardium, extracardiac:  There was a left pleural effusion.    Assessment and Plan:   1. SOB     There is a chronic component, her CXR findings are noted and await CT results, and an unusual upper extremity/facial swelling  Pending CT, may ask medicine to admit     Her newer c/o SOB sounds of PND, although CXR does not support this, she took '40mg'$  PO lasix yesterday and reports lost 4lbs      IV lasix then PO, will check BNP  2. Paroxysmal AFib     CHA2DS2Vasc is at lest 4, on warfarin     She had QT prolongation with Tikosyn historically     INR is therapeutic, she arrived in SR, will change cardizem to amiodarone     The patient reports her thyroid replacement has been stable, managed with her PMD.  3. PPM     carelink transmission reviewed, intact pacer function, battery is stable     NSVT appears rapid AFib    Await CT scan for admission decisions          Signed, Tommye Standard, PA-C 02/03/2017 12:42 PM   Patient presents with intermittent but increasingly frequent atrial fibrillation with a rapid rate, progressive orthopnea and nocturnal dyspnea but also accompanied by swelling of the face mouth internally and upper extremities without lower extremity edema  Physical examination is notable for JVP of only 7 or 8 and without peripheral edema. She has some bibasilar crackles. Heart rate is rapid. Facial swelling is not appreciated  ECG on arrival demonstrated sinus rhythm with a QTC of about 430 ms. This regard, it is notable that she failed that dofetilide in the past because of QT prolongation but tolerated amiodarone for long periods of time. The latter was stopped about a year or so ago because of infrequent atrial fibrillation  Assessment and plan  Shortness of breath/nocturnal dyspnea  Atrial fibrillation-paroxysmal with a history of catheter ablation at [facility] and hybrid- convergent procedure at [facility]  Pacemaker following convergent procedure for sinus node  dysfunction-Medtronic  Abnormal chest x-ray  Treated hypothyroidism  Sensation of facial swelling  The cause of her shortness of breath is not clear.  Given its recombinant nature, it is likely cardiac. This would not explain upper extremity and facial swelling however. In this regard the chest x-ray with parasternal shadows is concerning CT scan has been ordered and malignancy and other potential compression causes need to be excluded.  We will continue her on intravenous diuretics  Reason for atrial fibrillation rhythm control  Anticipate pulmonary consultation  Measure a albumin as well as a TSH and facial swelling.  Medicine H& P will be requested  The patient's CT scan has been reviewed and is concerning for malignancy.

## 2017-02-03 NOTE — H&P (Signed)
History and Physical    AFIA MESSENGER BTD:[id number] DOB: 1946-05-15 DOA: 02/03/2017  PCP: [name], MD   Patient coming from: Home  Chief Complaint: Face swelling, shortness of breath, dizziness  HPI: Kayla Harrison is a 71 y.o. woman with a history of breast and thyroid cancers, prior CVA, paroxysmal atrial fibrillation (CHADS-Vasc score of at least 4, anticoagulated with warfarin) S/P PPM implant who presents to the ED for evaluation of shortness of breath, dizziness, and face swelling.  She has had intermittent palpitations as well.  No significant chest pain.  She has had near-syncope.  Shortness of breath is primarily with exertion.  She took a prn lasix yesterday for ankle swelling and reports that her weight is down 4 lbs after diuresis.  No nausea, vomiting, or abdominal pain.  No dysuria.  No constipation or diarrhea.  She thought her face swelling might be secondary to bisoprolol, so she stopped this medication one week ago.      ED Course: The patient has had intermittent atrial fibrillation with RVR in the ED although her initial EKG showed NSR with PVCs.  Cardiology was called initially.  NSR has been restored after IV cardizem '20mg'$  x 1, then amiodarone '150mg'$  IV x one.  She will now be maintained on amiodarone '400mg'$  po TID.  However, admission deferred to hospitalist because a lung mass with probable SVC syndrome has also been found.  Renal function is at baseline.  INR 2.06.  BNP 70.5.    Review of Systems: As per HPI otherwise 10 systems reviewed and negative.   Past Medical History:  Diagnosis Date  . Arthritis    osteoarthritis - knees   . Atrial fibrillation [facility])    ablation- 2x's-- 1st time- [facility], 2nd event at [facility] in 2008. Convergent ablation at [facility] 6/14  . Breast cancer ([place])    Dr Margot Chimes, total thyroidectomy- 1999- for cancer  . Colon polyp    Dr Earlean Shawl  . Complication of anesthesia    Ketamine produces LSD reaction, bright colored  nightmarish experience   . Dyslipidemia   . Dysrhythmia    a fib with internal/external ablation, currently NSR  . Hepatitis    [place]hepatitis   . Hx of thyroid cancer    Dr Forde Dandy  . Hyperlipidemia   . Hypothyroidism   . Morbid obesity ([place])    Status post lap band surgery  . Normal cardiac stress test    2008- cardiac stress/echo  . Pacemaker-[employer]   . Pneumonia 2010   [facility]- during that event had to be cardioverted   . Renal calculi   . Sinus node dysfunction ([place])    Complicating convergent ablation 6/14  . Stroke [facility])    2003- [place]     Past Surgical History:  Procedure Laterality Date  . ABDOMINAL HYSTERECTOMY  1983  . afib ablation     X 2; [facility] & Dr Caryl Comes  . APPENDECTOMY    . BREAST LUMPECTOMY  2010  . bso  1998  . CARDIOVERSION  10/09/2012   Procedure: CARDIOVERSION;  Surgeon: Minus Breeding, MD;  Location: [place];  Service: Cardiovascular;  Laterality: N/A;  . CARDIOVERSION  10/09/2012   Procedure: CARDIOVERSION;  Surgeon: Minus Breeding, MD;  Location: [facility] ENDOSCOPY;  Service: Cardiovascular;  Laterality: N/A;  [name] gave the ok to add pt to the add on , but we must check to find out if the can add pt on at  [phone])  . CARDIOVERSION N/A 11/20/2012   Procedure: CARDIOVERSION;  Surgeon: Fay Records, MD;  Location: [facility] ENDOSCOPY;  Service: Cardiovascular;  Laterality: N/A;  . CHOLECYSTECTOMY    . COLONOSCOPY W/ POLYPECTOMY     Dr Earlean Shawl  . convergent    . CYSTOSCOPY N/A 02/06/2015   Procedure: CYSTOSCOPY;  Surgeon: Kathie Rhodes, MD;  Location: [facility];  Service: Urology;  Laterality: N/A;  . CYSTOSCOPY WITH RETROGRADE PYELOGRAM, URETEROSCOPY AND STENT PLACEMENT Right 02/06/2015   Procedure: RETROGRADE PYELOGRAM, RIGHT URETEROSCOPY STENT PLACEMENT;  Surgeon: Kathie Rhodes, MD;  Location: [facility];  Service: Urology;  Laterality: Right;  . EYE SURGERY     cataract surgery  . HOLMIUM LASER APPLICATION N/A 8/84/1660    Procedure: HOLMIUM LASER APPLICATION;  Surgeon: Kathie Rhodes, MD;  Location: [facility];  Service: Urology;  Laterality: N/A;  . JOINT REPLACEMENT  04/13/12   Right Knee replacement  . KNEE ARTHROSCOPY     bilateral  . LAPAROSCOPIC GASTRIC BANDING  07/10/2010  . LAPAROTOMY     for ruptured ovary and ovarian artery   . PACEMAKER INSERTION  03/10/13   [facility]  . POCKET REVISION N/A 12/08/2013   Procedure: POCKET REVISION;  Surgeon: Deboraha Sprang, MD;  Location: [facility] CATH LAB;  Service: Cardiovascular;  Laterality: N/A;  . THYROIDECTOMY  1998   Dr Margot Chimes  . TONSILLECTOMY    . TOTAL KNEE ARTHROPLASTY  04/13/2012   Procedure: TOTAL KNEE ARTHROPLASTY;  Surgeon: Rudean Haskell, MD;  Location: [place];  Service: Orthopedics;  Laterality: Right;  . Transabdominal ablation  03/02/13   [facility]     reports that she has never smoked. She has never used smokeless tobacco. She reports that she does not drink alcohol or use drugs.  She is not married.  She does not have children.  Her sisters are her next of kin.  She is retired from the [employer], [employer].  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  Allergies  Allergen Reactions  . Tikosyn [Dofetilide]     Prolonged QT interval Prolonged QT interval  . Xarelto [Rivaroxaban]     Nose Bleed X 6 hrs ; packing in ER  . Epinephrine Other (See Comments) and Rash    Dental form only (liquid). Patient stated she will become "out of it" she can hear you but cannot respond in normal fashion.  Marland Kitchen Ketamine     Hallucinations  . Eliquis [Apixaban] Other (See Comments)    BLEEDING IN URINE    Family History  Problem Relation Age of Onset  . Cancer Father        COLON  . Heart disease Father 38       MI '@autopsy'$   . Diabetes Sister   . Diabetes Brother   . Diabetes Paternal Aunt   . Diabetes Paternal Grandmother      Prior to Admission medications   Medication Sig Start Date End Date Taking? Authorizing Provider  bisoprolol (ZEBETA) 5 MG tablet Take 1/2  tablet (2.5 mg) by mouth once daily 12/25/16  Yes Deboraha Sprang, MD  cycloSPORINE (RESTASIS) 0.05 % ophthalmic emulsion Place 1 drop into both eyes 2 (two) times daily.   Yes [provider]  furosemide (LASIX) 40 MG tablet Take 40 mg by mouth daily as needed for fluid.    Yes [provider]  levothyroxine (SYNTHROID, LEVOTHROID) 175 MCG tablet Take 87.5-175 mcg by mouth daily before breakfast. TAKE 1 TABLET BY MOUTH EVERY DAY  EXCEPT, TAKE 1/2 TABLET ON Friday AND NO TABLET ON Saturday   Yes [provider]  Lifitegrast Shirley Friar) 5 % SOLN Apply 1 drop to eye 2 (two) times daily.   Yes [provider]  Multiple Vitamin (MULTIVITAMIN) tablet Take 1 tablet by mouth daily.    Yes [provider]  warfarin (COUMADIN) 5 MG tablet TAKE AS DIRECTED BY  [facility] 10/07/16  Yes Deboraha Sprang, MD    Physical Exam: Vitals:   02/03/17 1630 02/03/17 1730 02/03/17 1800 02/03/17 2000  BP: 131/62 (!) 114/53 138/65 (!) 152/61  Pulse: (!) 34 64 71 80  Resp: '16 16 12 15  '$ Temp:      TempSrc:      SpO2: 96% 98% 98% 100%  Weight:      Height:          Constitutional: NAD, calm, comfortable, anxious but nontoxic appearing Vitals:   02/03/17 1630 02/03/17 1730 02/03/17 1800 02/03/17 2000  BP: 131/62 (!) 114/53 138/65 (!) 152/61  Pulse: (!) 34 64 71 80  Resp: '16 16 12 15  '$ Temp:      TempSrc:      SpO2: 96% 98% 98% 100%  Weight:      Height:       Eyes: PERRL, lids and conjunctivae normal ENMT: Mucous membranes are moist.  Normal dentition.  Face is flushed and swollen. Neck: normal appearance, supple, no masses Respiratory: clear to auscultation bilaterally, no wheezing, no crackles. Normal respiratory effort. No accessory muscle use.  No stridor.  Cardiovascular: Normal rate, regular rhythm, no murmurs / rubs / gallops. No extremity edema. 2+ pedal pulses. No carotid bruits.  GI: abdomen is soft and compressible.  No distention.  No tenderness.  No  masses palpated.  Bowel sounds are present. Musculoskeletal:  No joint deformity in upper and lower extremities. Good ROM, no contractures. Normal muscle tone.  Skin: no rashes, pale, cool, dry Neurologic: Moves all four extremities spontaneously.  No gross neurological deficits. Psychiatric: Normal judgment and insight. Alert and oriented x 3. Normal mood.     Labs on Admission: I have personally reviewed following labs and imaging studies  CBC:  Recent Labs Lab 02/03/17 0947  WBC 7.8  HGB 13.7  HCT 44.1  MCV 90.2  PLT 191   Basic Metabolic Panel:  Recent Labs Lab 02/03/17 0947  NA 141  K 4.4  CL 105  CO2 26  GLUCOSE 100*  BUN 21*  CREATININE 1.09*  CALCIUM 9.6   GFR: Estimated Creatinine Clearance: 56.9 mL/min (A) (by C-G formula based on SCr of 1.09 mg/dL (H)).  Coagulation Profile:  Recent Labs Lab 02/03/17 0947  INR 2.06   BNP (last 3 results) 70.5   Radiological Exams on Admission: Dg Chest 2 View  Result Date: 02/03/2017 CLINICAL DATA:  71 year old female with increased shortness breath over the past 5-6 days. Thyroid cancer. Breast cancer. Initial encounter. EXAM: CHEST  2 VIEW COMPARISON:  06/30/2015 chest x-ray FINDINGS: Abnormality right paratracheal region suspicious for mass. Vascular abnormality secondary consideration. Chest CT recommended for further evaluation. Post thyroidectomy. Prior clipping left atrium. Sequential pacemaker in place. Heart size within normal limits. No infiltrate, congestive heart failure or pneumothorax. Erosion distal right clavicle possibly degenerative in origin. Calcified aorta. IMPRESSION: Abnormality right paratracheal region suspicious for mass. Vascular abnormality secondary consideration. Chest CT recommended for further evaluation. Post thyroidectomy and breast surgery. Prior clipping left atrium. Sequential pacemaker in place. Heart size within normal limits. No infiltrate  or congestive heart failure. Erosion  distal right clavicle possibly degenerative in origin. These results were called by telephone at the time of interpretation on 02/03/2017 at 10:11 am to Dr. Lajean Saver , who verbally acknowledged these results. Electronically Signed   By: Genia Del M.D.   On: 02/03/2017 10:15   Ct Chest W Contrast  Result Date: 02/03/2017 CLINICAL DATA:  Pt c/o difficulty breathing dizziness syncope, Afib off and on but on almost all day today. History of thyroid cancer and breast cancer. EXAM: CT CHEST WITH CONTRAST TECHNIQUE: Multidetector CT imaging of the chest was performed during intravenous contrast administration. CONTRAST:  19m ISOVUE-300 IOPAMIDOL (ISOVUE-300) INJECTION 61% COMPARISON:  02/03/2017 and CT of the chest on 04/21/2013 FINDINGS: Cardiovascular: There is extensive coronary artery calcification. Left atrial appendage clip is in place. Mitral annulus calcification. The heart is mildly enlarged. No pericardial effusion. Bovine arch anatomy. There is narrowing of the superior vena cava secondary to mediastinal mass described below. There is filling and numerous collateral vessels. Left upper extremity contrast injection. Mediastinum/Nodes: Superior mediastinal mass is 5.4 x 7.1 x 6.0 cm. Mass results in significant narrowing of the superior vena cava and has a long interface with the ascending aorta, trachea, superior vena cava, and right brachiocephalic artery. Mass is contiguous with the right hilar structures and encases the trachea and right mainstem bronchus. Small mediastinal lymph nodes are identified in the subcarinal region (1.6 cm) left hilar region (1.0 cm). Lungs/Pleura: Multiple small pulmonary nodules are present. In the right upper lobe, a 6 mm nodule is identified on image 39 of series 7. In the right upper lobe a 4 mm nodule is identified on image 50 of series 7. In the left lower lobe, a 5 mm nodule is identified on image 90 of series 7. There are no pleural effusions or focal  consolidations. Upper Abdomen: Multiple calcified granulomata are identified within the spleen. Patient has a lap band in place around the proximal stomach. A small low-attenuation lesion is identified in the left hepatic lobe measuring 9 mm on image 117 of series 3, stable in appearance and consistent with cyst. Small calcified hepatic granulomata are present. The adrenal glands are normal in appearance. Bilateral intrarenal calculi. Musculoskeletal: Degenerative changes are seen in the thoracic spine. No suspicious lytic or blastic lesions are identified. IMPRESSION: 1. Large superior mediastinal mass contiguous with the right hilar region resulting in significant narrowing of the superior vena cava and collateral vessels filling. Findings are suspicious for malignancy, either primary secondary. 2. Small pulmonary nodules as described, largest measuring 6 mm in the right upper lobe. 3. Coronary artery disease. Mitral annulus calcification. Cardiomegaly. 4. Bovine arch anatomy. 5. Prior granulomatous disease. Numerous calcified granulomata within the liver and spleen. 6. Hepatic cyst. Electronically Signed   By: ENolon NationsM.D.   On: 02/03/2017 17:30    EKG: Independently reviewed. NSR with PVCs.  No acute ST segment changes.  Assessment/Plan Principal Problem:   Lung mass Active Problems:   Atrial fibrillation (HCC)   Long term (current) use of anticoagulants   Hypothyroidism   Superior vena cava syndrome      Superior mediastinal mass contiguous with the right hilum causing compression of the SVC, suspicious for malignancy --Pulmonary consult pending --She will need prompt biopsy of this mass --HOLD warfarin now; heparin bridge dosing per pharmacy --Daily INR --NPO after midnight  Paroxysmal atrial fibrillation --Amiodarone '400mg'$  po TID --Cardiology following --Continue to hold bisoprolol for now --Check TSH  Hypothyroidsim --  Levothyroxine   DVT prophylaxis:  Anticoagulated Code Status: FULL Family Communication: Patient alone in the ED at time of admission Disposition Plan: To be determined. Consults called: Cardiology, Pulmonary Admission status: Inpatient, telemetry.  I expect this patient will need inpatient services for greater than two midnights.   TIME SPENT: 70 minutes   Eber Jones MD [facility] Pager [phone]  If 7PM-7AM, please contact night-coverage [email or web address] Password [email or web address]  02/03/2017, 8:06 PM

## 2017-02-03 NOTE — ED Notes (Signed)
Dr. Loletha Grayer with cardiology returned page-- will follow up with dr Caryl Comes.

## 2017-02-03 NOTE — Consult Note (Signed)
Name: Kayla Harrison MRN: [id number] DOB: [birth year]    ADMISSION DATE:  02/03/2017 CONSULTATION DATE:  02/03/17  REFERRING MD :  Eulas Post  CHIEF COMPLAINT:  Facial swelling, SOB   HISTORY OF PRESENT ILLNESS:  Kayla Harrison is a 71 y.o. female with a PMH as outlined below including but not limited to breast CA (s/p lumpectomy and XRT 2010), thyroid CA (s/p thyroidectomy 1999).  She presented to [facility] ED on 5/14 with worsening facial swelling along with SOB. She states that facial swelling started some 3 weeks ago and has persisted. She initially thought that this was due to the pollen so did not think much of it; however as she had no additional allergic symptoms along with persistence and swelling and new onset of gradual SOB, she knew that something else was going on. On 5/13, she had worsening of symptoms; therefore, 5/14 she came to the ED for further evaluation.  She was found to have PAF with RVR. She was seen by EP and was started on amiodarone.  Additionally, CT of the chest demonstrated a large superior mediastinal mass contiguous with the right hilar region resulting in significant narrowing of the SVC and collateral vessels. Findings very suspicious for malignancy. Also, small pulmonary nodules, largest 25m in RUL.  Given her CT findings, PCCM was asked to see in consultation. She denies any weight loss, hemoptysis. She is a non-smoker and has never smoked in her life. She had lumpectomy and radiation in 2010 for breast cancer and also thyroidectomy 1999 for thyroid cancer. Otherwise, no additional history of malignancies.  PAST MEDICAL HISTORY :   has a past medical history of Arthritis; Atrial fibrillation ([place]; Breast cancer ([place]; Colon polyp; Complication of anesthesia; Dyslipidemia; Dysrhythmia; Hepatitis; thyroid cancer; Hyperlipidemia; Hypothyroidism; Morbid obesity ([place]; Normal cardiac stress test; Pacemaker-[employer]; Pneumonia (2010); Renal calculi; Sinus node dysfunction  ([facility]); and Stroke ([place].  has a past surgical history that includes Thyroidectomy (1998); bso (1998); Cholecystectomy; Tonsillectomy; Colonoscopy w/ polypectomy; afib ablation; Abdominal hysterectomy (1983); Breast lumpectomy (2010); Laparoscopic gastric banding (07/10/2010); Appendectomy; Knee arthroscopy; laparotomy; Total knee arthroplasty (04/13/2012); Joint replacement (04/13/12); Cardioversion (10/09/2012); Cardioversion (10/09/2012); Cardioversion (N/A, 11/20/2012); Transabdominal ablation (03/02/13); Pacemaker insertion (03/10/13); convergent; pocket revision (N/A, 12/08/2013); Eye surgery; Cystoscopy with retrograde pyelogram, ureteroscopy and stent placement (Right, 02/06/2015); Holmium laser application (N/A, 57/86/7672; and Cystoscopy (N/A, 02/06/2015). Prior to Admission medications   Medication Sig Start Date End Date Taking? Authorizing Provider  bisoprolol (ZEBETA) 5 MG tablet Take 1/2 tablet (2.5 mg) by mouth once daily 12/25/16  Yes KDeboraha Sprang MD  cycloSPORINE (RESTASIS) 0.05 % ophthalmic emulsion Place 1 drop into both eyes 2 (two) times daily.   Yes [provider]  furosemide (LASIX) 40 MG tablet Take 40 mg by mouth daily as needed for fluid.    Yes [provider]  levothyroxine (SYNTHROID, LEVOTHROID) 175 MCG tablet Take 87.5-175 mcg by mouth daily before breakfast. TAKE 1 TABLET BY MOUTH EVERY DAY EXCEPT, TAKE 1/2 TABLET ON Friday AND NO TABLET ON Saturday   Yes [provider]  Lifitegrast (Shirley Friar 5 % SOLN Apply 1 drop to eye 2 (two) times daily.   Yes [provider]  Multiple Vitamin (MULTIVITAMIN) tablet Take 1 tablet by mouth daily.    Yes [provider]  warfarin (COUMADIN) 5 MG tablet TAKE AS DIRECTED BY  [facility] 10/07/16  Yes KDeboraha Sprang MD   Allergies  Allergen Reactions  . Tikosyn [Dofetilide]     Prolonged QT interval  Prolonged QT interval  . Xarelto [Rivaroxaban]     Nose Bleed X 6 hrs ; packing in ER  .  Epinephrine Other (See Comments) and Rash    Dental form only (liquid). Patient stated she will become "out of it" she can hear you but cannot respond in normal fashion.  Marland Kitchen Ketamine     Hallucinations  . Eliquis [Apixaban] Other (See Comments)    BLEEDING IN URINE    FAMILY HISTORY:  family history includes Cancer in her father; Diabetes in her brother, paternal aunt, paternal grandmother, and sister; Heart disease (age of onset: 54) in her father. SOCIAL HISTORY:  reports that she has never smoked. She has never used smokeless tobacco. She reports that she does not drink alcohol or use drugs.  REVIEW OF SYSTEMS:   All negative; except for those that are bolded, which indicate positives.  Constitutional: weight loss, weight gain, night sweats, fevers, chills, fatigue, weakness.  HEENT: headaches, sore throat, sneezing, nasal congestion, post nasal drip, difficulty swallowing, tooth/dental problems, visual complaints, visual changes, ear aches, facial swelling. Neuro: difficulty with speech, weakness, numbness, ataxia. CV:  chest pain, orthopnea, PND, swelling in lower extremities, dizziness, palpitations, syncope.  Resp: cough, hemoptysis, dyspnea, wheezing. GI: heartburn, indigestion, abdominal pain, nausea, vomiting, diarrhea, constipation, change in bowel habits, loss of appetite, hematemesis, melena, hematochezia.  GU: dysuria, change in color of urine, urgency or frequency, flank pain, hematuria. MSK: joint pain or swelling, decreased range of motion. Psych: change in mood or affect, depression, anxiety, suicidal ideations, homicidal ideations. Skin: rash, itching, bruising.    SUBJECTIVE:  Dyspnea improved.  Currently asymptomatic.  VITAL SIGNS: Temp:  [97.1 F (36.2 C)-97.7 F (36.5 C)] 97.7 F (36.5 C) (05/14 2129) Pulse Rate:  [32-154] 85 (05/14 2129) Resp:  [12-21] 18 (05/14 2129) BP: (114-154)/(47-108) 133/47 (05/14 2129) SpO2:  [92 %-99 %] 98 % (05/14 2129) Weight:   [101.8 kg (224 lb 8 oz)-104.3 kg (230 lb)] 101.8 kg (224 lb 8 oz) (05/14 2129)  PHYSICAL EXAMINATION: General: Adult female, resting in bed, in NAD. Neuro: A&O x 3, non-focal.  HEENT: [place]/AT. PERRL, sclerae anicteric. Cardiovascular: RRR, no M/R/G.  Lungs: Respirations even and unlabored.  CTA bilaterally, No W/R/R.  Abdomen: BS x 4, soft, NT/ND.  Musculoskeletal: No gross deformities, no edema.  Skin: Intact, warm, no rashes.     Recent Labs Lab 02/03/17 0947  NA 141  K 4.4  CL 105  CO2 26  BUN 21*  CREATININE 1.09*  GLUCOSE 100*    Recent Labs Lab 02/03/17 0947  HGB 13.7  HCT 44.1  WBC 7.8  PLT 227   Dg Chest 2 View  Result Date: 02/03/2017 CLINICAL DATA:  70 year old female with increased shortness breath over the past 5-6 days. Thyroid cancer. Breast cancer. Initial encounter. EXAM: CHEST  2 VIEW COMPARISON:  06/30/2015 chest x-ray FINDINGS: Abnormality right paratracheal region suspicious for mass. Vascular abnormality secondary consideration. Chest CT recommended for further evaluation. Post thyroidectomy. Prior clipping left atrium. Sequential pacemaker in place. Heart size within normal limits. No infiltrate, congestive heart failure or pneumothorax. Erosion distal right clavicle possibly degenerative in origin. Calcified aorta. IMPRESSION: Abnormality right paratracheal region suspicious for mass. Vascular abnormality secondary consideration. Chest CT recommended for further evaluation. Post thyroidectomy and breast surgery. Prior clipping left atrium. Sequential pacemaker in place. Heart size within normal limits. No infiltrate or congestive heart failure. Erosion distal right clavicle possibly degenerative in origin. These results were called by telephone at the time  of interpretation on 02/03/2017 at 10:11 am to Dr. Lajean Saver , who verbally acknowledged these results. Electronically Signed   By: Genia Del M.D.   On: 02/03/2017 10:15   Ct Chest W  Contrast  Result Date: 02/03/2017 CLINICAL DATA:  Pt c/o difficulty breathing dizziness syncope, Afib off and on but on almost all day today. History of thyroid cancer and breast cancer. EXAM: CT CHEST WITH CONTRAST TECHNIQUE: Multidetector CT imaging of the chest was performed during intravenous contrast administration. CONTRAST:  21m ISOVUE-300 IOPAMIDOL (ISOVUE-300) INJECTION 61% COMPARISON:  02/03/2017 and CT of the chest on 04/21/2013 FINDINGS: Cardiovascular: There is extensive coronary artery calcification. Left atrial appendage clip is in place. Mitral annulus calcification. The heart is mildly enlarged. No pericardial effusion. Bovine arch anatomy. There is narrowing of the superior vena cava secondary to mediastinal mass described below. There is filling and numerous collateral vessels. Left upper extremity contrast injection. Mediastinum/Nodes: Superior mediastinal mass is 5.4 x 7.1 x 6.0 cm. Mass results in significant narrowing of the superior vena cava and has a long interface with the ascending aorta, trachea, superior vena cava, and right brachiocephalic artery. Mass is contiguous with the right hilar structures and encases the trachea and right mainstem bronchus. Small mediastinal lymph nodes are identified in the subcarinal region (1.6 cm) left hilar region (1.0 cm). Lungs/Pleura: Multiple small pulmonary nodules are present. In the right upper lobe, a 6 mm nodule is identified on image 39 of series 7. In the right upper lobe a 4 mm nodule is identified on image 50 of series 7. In the left lower lobe, a 5 mm nodule is identified on image 90 of series 7. There are no pleural effusions or focal consolidations. Upper Abdomen: Multiple calcified granulomata are identified within the spleen. Patient has a lap band in place around the proximal stomach. A small low-attenuation lesion is identified in the left hepatic lobe measuring 9 mm on image 117 of series 3, stable in appearance and consistent with  cyst. Small calcified hepatic granulomata are present. The adrenal glands are normal in appearance. Bilateral intrarenal calculi. Musculoskeletal: Degenerative changes are seen in the thoracic spine. No suspicious lytic or blastic lesions are identified. IMPRESSION: 1. Large superior mediastinal mass contiguous with the right hilar region resulting in significant narrowing of the superior vena cava and collateral vessels filling. Findings are suspicious for malignancy, either primary secondary. 2. Small pulmonary nodules as described, largest measuring 6 mm in the right upper lobe. 3. Coronary artery disease. Mitral annulus calcification. Cardiomegaly. 4. Bovine arch anatomy. 5. Prior granulomatous disease. Numerous calcified granulomata within the liver and spleen. 6. Hepatic cyst. Electronically Signed   By: ENolon NationsM.D.   On: 02/03/2017 17:30    STUDIES:  CT chest 5/14 >  large superior mediastinal mass contiguous with the right hilar region resulting in significant narrowing of the SVC and collateral vessels. Findings very suspicious for malignancy. Also, small pulmonary nodules, largest 666min RUL.  SIGNIFICANT EVENTS  5/14 > admit.  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 Either way, she will need biopsy for definitive diagnosis as well as guidance of treatment options. This will likely best be achieved by EBUS. Plan: Day team to discuss with Dr. NeAshok Cordiaho can review images / case and decide whether EBUS would be best course.  If so, Dr. Ashok Cordia to schedule ASAP with OR (I informed pt that we are unsure of timing of this given the need for OR time, etc).  Nothing further for now, no sign of airway compromise at all. Supportive care.  Rest per primary team.   Montey Hora, [place] [facility] Pager: [phone]  or [phone] 02/03/2017, 9:33 PM

## 2017-02-03 NOTE — ED Triage Notes (Signed)
c/o swelling of her face and hoarseness onset 3 weeks ago thought it was her alleriges, c/o sob 1 week ago states she had a super ablation  4 years ago and developed fluid around her lungs after, states she feels the same way now. States she was put on a new medication bisproplo however stoped last tues.

## 2017-02-03 NOTE — Telephone Encounter (Signed)
Patient calling, states that she received a letter informing her that her last device transmission was not received. Thanks.

## 2017-02-04 ENCOUNTER — Encounter (HOSPITAL_COMMUNITY): Payer: Self-pay | Admitting: General Practice

## 2017-02-04 ENCOUNTER — Other Ambulatory Visit (HOSPITAL_COMMUNITY): Payer: Medicare Other

## 2017-02-04 DIAGNOSIS — R918 Other nonspecific abnormal finding of lung field: Secondary | ICD-10-CM

## 2017-02-04 DIAGNOSIS — R9389 Abnormal findings on diagnostic imaging of other specified body structures: Secondary | ICD-10-CM

## 2017-02-04 LAB — COMPREHENSIVE METABOLIC PANEL
ALT: 16 U/L (ref 14–54)
AST: 18 U/L (ref 15–41)
Albumin: 3.2 g/dL — ABNORMAL LOW (ref 3.5–5.0)
Alkaline Phosphatase: 65 U/L (ref 38–126)
Anion gap: 7 (ref 5–15)
BILIRUBIN TOTAL: 0.8 mg/dL (ref 0.3–1.2)
BUN: 18 mg/dL (ref 6–20)
CALCIUM: 8.7 mg/dL — AB (ref 8.9–10.3)
CO2: 26 mmol/L (ref 22–32)
CREATININE: 0.94 mg/dL (ref 0.44–1.00)
Chloride: 106 mmol/L (ref 101–111)
GFR calc non Af Amer: 59 mL/min — ABNORMAL LOW (ref >60)
Glucose, Bld: 102 mg/dL — ABNORMAL HIGH (ref 65–99)
Potassium: 3.5 mmol/L (ref 3.5–5.1)
SODIUM: 139 mmol/L (ref 135–145)
Total Protein: 5.8 g/dL — ABNORMAL LOW (ref 6.5–8.1)

## 2017-02-04 LAB — TSH: TSH: 0.202 u[IU]/mL — ABNORMAL LOW (ref 0.350–4.500)

## 2017-02-04 LAB — PROTIME-INR
INR: 2.51
Prothrombin Time: 27.6 seconds — ABNORMAL HIGH (ref 11.4–15.2)

## 2017-02-04 NOTE — Progress Notes (Signed)
   02/04/17 1000  Clinical Encounter Type  Visited With Patient  Visit Type Other (Comment) ([place] consult)  Spiritual Encounters  Spiritual Needs Prayer  Stress Factors  Patient Stress Factors None identified  Introduction to Pt. Offered prayer of comfort and hope.

## 2017-02-04 NOTE — Progress Notes (Addendum)
Name: Kayla Harrison MRN: [id number] DOB: 1946-05-06    ADMISSION DATE:  02/03/2017 CONSULTATION DATE:  02/03/17  REFERRING MD :  Eulas Post  CHIEF COMPLAINT:  Facial swelling, SOB   HISTORY OF PRESENT ILLNESS:  Kayla Harrison is a 71 y.o. female with a PMH as outlined below including but not limited to breast CA (s/p lumpectomy and XRT 2010), thyroid CA (s/p thyroidectomy 1999).  She presented to [facility] ED on 5/14 with worsening facial swelling along with SOB. She states that facial swelling started some 3 weeks ago and has persisted. She initially thought that this was due to the pollen so did not think much of it; however as she had no additional allergic symptoms along with persistence and swelling and new onset of gradual SOB, she knew that something else was going on. On 5/13, she had worsening of symptoms; therefore, 5/14 she came to the ED for further evaluation.  She was found to have PAF with RVR. She was seen by EP and was started on amiodarone.  Additionally, CT of the chest demonstrated a large superior mediastinal mass contiguous with the right hilar region resulting in significant narrowing of the SVC and collateral vessels. Findings very suspicious for malignancy. Also, small pulmonary nodules, largest 43m in RUL.  Given her CT findings, PCCM was asked to see in consultation. She denies any weight loss, hemoptysis. She is a non-smoker and has never smoked in her life. She had lumpectomy and radiation in 2010 for breast cancer and also thyroidectomy 1999 for thyroid cancer. Otherwise, no additional history of malignancies.  PAST MEDICAL HISTORY :   has a past medical history of Arthritis; Atrial fibrillation ([place]; Breast cancer ([facility]; Colon polyp; Complication of anesthesia; Dyslipidemia; Dysrhythmia; Hepatitis; thyroid cancer; Hyperlipidemia; Hypothyroidism; Morbid obesity ([facility]; Normal cardiac stress test; Pacemaker-[employer]; Pneumonia (2010); Renal calculi; Sinus node dysfunction  (HCC); and Stroke ([facility].  has a past surgical history that includes Thyroidectomy (1998); bso (1998); Cholecystectomy; Tonsillectomy; Colonoscopy w/ polypectomy; afib ablation; Abdominal hysterectomy (1983); Breast lumpectomy (2010); Laparoscopic gastric banding (07/10/2010); Appendectomy; Knee arthroscopy; laparotomy; Total knee arthroplasty (04/13/2012); Joint replacement (04/13/12); Cardioversion (10/09/2012); Cardioversion (10/09/2012); Cardioversion (N/A, 11/20/2012); Transabdominal ablation (03/02/13); Pacemaker insertion (03/10/13); convergent; pocket revision (N/A, 12/08/2013); Eye surgery; Cystoscopy with retrograde pyelogram, ureteroscopy and stent placement (Right, 02/06/2015); Holmium laser application (N/A, 50/17/5102; and Cystoscopy (N/A, 02/06/2015). Prior to Admission medications   Medication Sig Start Date End Date Taking? Authorizing Provider  bisoprolol (ZEBETA) 5 MG tablet Take 1/2 tablet (2.5 mg) by mouth once daily 12/25/16  Yes KDeboraha Sprang MD  cycloSPORINE (RESTASIS) 0.05 % ophthalmic emulsion Place 1 drop into both eyes 2 (two) times daily.   Yes [provider]  furosemide (LASIX) 40 MG tablet Take 40 mg by mouth daily as needed for fluid.    Yes [provider]  levothyroxine (SYNTHROID, LEVOTHROID) 175 MCG tablet Take 87.5-175 mcg by mouth daily before breakfast. TAKE 1 TABLET BY MOUTH EVERY DAY EXCEPT, TAKE 1/2 TABLET ON Friday AND NO TABLET ON Saturday   Yes [provider]  Lifitegrast (Shirley Friar 5 % SOLN Apply 1 drop to eye 2 (two) times daily.   Yes [provider]  Multiple Vitamin (MULTIVITAMIN) tablet Take 1 tablet by mouth daily.    Yes [provider]  warfarin (COUMADIN) 5 MG tablet TAKE AS DIRECTED BY  [facility] CLINIC 10/07/16  Yes KDeboraha Sprang MD   Allergies  Allergen Reactions  . Tikosyn [Dofetilide]     Prolonged QT interval  Prolonged QT interval  . Xarelto [Rivaroxaban]     Nose Bleed X 6 hrs ; packing in ER  .  Epinephrine Other (See Comments) and Rash    Dental form only (liquid). Patient stated she will become "out of it" she can hear you but cannot respond in normal fashion.  Marland Kitchen Ketamine     Hallucinations  . Eliquis [Apixaban] Other (See Comments)    BLEEDING IN URINE    FAMILY HISTORY:  family history includes Cancer in her father; Diabetes in her brother, paternal aunt, paternal grandmother, and sister; Heart disease (age of onset: 7) in her father. SOCIAL HISTORY:  reports that she has never smoked. She has never used smokeless tobacco. She reports that she does not drink alcohol or use drugs.  REVIEW OF SYSTEMS:   All negative; except for those that are bolded, which indicate positives.  Constitutional: weight loss, weight gain, night sweats, fevers, chills, fatigue, weakness.  HEENT: headaches, sore throat, sneezing, nasal congestion, post nasal drip, difficulty swallowing, tooth/dental problems, visual complaints, visual changes, ear aches, facial swelling. Neuro: difficulty with speech, weakness, numbness, ataxia. CV:  chest pain, orthopnea, PND, swelling in lower extremities, dizziness, palpitations, syncope.  Resp: cough, hemoptysis, dyspnea, wheezing. GI: heartburn, indigestion, abdominal pain, nausea, vomiting, diarrhea, constipation, change in bowel habits, loss of appetite, hematemesis, melena, hematochezia.  GU: dysuria, change in color of urine, urgency or frequency, flank pain, hematuria. MSK: joint pain or swelling, decreased range of motion. Psych: change in mood or affect, depression, anxiety, suicidal ideations, homicidal ideations. Skin: rash, itching, bruising.  SUBJECTIVE:  Dyspnea is better. No issues overnight.   VITAL SIGNS: Temp:  [97.6 F (36.4 C)-97.7 F (36.5 C)] 97.6 F (36.4 C) (05/15 0511) Pulse Rate:  [34-133] 86 (05/15 1000) Resp:  [12-18] 18 (05/15 0511) BP: (114-152)/(47-91) 120/65 (05/15 1000) SpO2:  [96 %-99 %] 98 % (05/14 2129) Weight:  [224  lb 8 oz (101.8 kg)] 224 lb 8 oz (101.8 kg) (05/14 2129)  PHYSICAL EXAMINATION: Gen:      No acute distress HEENT:  EOMI, sclera anicteric Neck:     No masses; no thyromegaly Lungs:    Clear to auscultation bilaterally; normal respiratory effort CV:         Regular rate and rhythm; no murmurs Abd:      + bowel sounds; soft, non-tender; no palpable masses, no distension Ext:    No edema; adequate peripheral perfusion Skin:      Warm and dry; no rash Neuro: alert and oriented x 3 Psych: normal mood and affect   Recent Labs Lab 02/03/17 0947 02/04/17 0204  NA 141 139  K 4.4 3.5  CL 105 106  CO2 26 26  BUN 21* 18  CREATININE 1.09* 0.94  GLUCOSE 100* 102*    Recent Labs Lab 02/03/17 0947  HGB 13.7  HCT 44.1  WBC 7.8  PLT 227   Dg Chest 2 View  Result Date: 02/03/2017 CLINICAL DATA:  71 year old female with increased shortness breath over the past 5-6 days. Thyroid cancer. Breast cancer. Initial encounter. EXAM: CHEST  2 VIEW COMPARISON:  06/30/2015 chest x-ray FINDINGS: Abnormality right paratracheal region suspicious for mass. Vascular abnormality secondary consideration. Chest CT recommended for further evaluation. Post thyroidectomy. Prior clipping left atrium. Sequential pacemaker in place. Heart size within normal limits. No infiltrate, congestive heart failure or pneumothorax. Erosion distal right clavicle possibly degenerative in origin. Calcified aorta. IMPRESSION: Abnormality right paratracheal region suspicious for mass. Vascular abnormality secondary consideration. Chest  CT recommended for further evaluation. Post thyroidectomy and breast surgery. Prior clipping left atrium. Sequential pacemaker in place. Heart size within normal limits. No infiltrate or congestive heart failure. Erosion distal right clavicle possibly degenerative in origin. These results were called by telephone at the time of interpretation on 02/03/2017 at 10:11 am to Dr. Lajean Saver , who verbally  acknowledged these results. Electronically Signed   By: Genia Del M.D.   On: 02/03/2017 10:15   Ct Chest W Contrast  Result Date: 02/03/2017 CLINICAL DATA:  Pt c/o difficulty breathing dizziness syncope, Afib off and on but on almost all day today. History of thyroid cancer and breast cancer. EXAM: CT CHEST WITH CONTRAST TECHNIQUE: Multidetector CT imaging of the chest was performed during intravenous contrast administration. CONTRAST:  72m ISOVUE-300 IOPAMIDOL (ISOVUE-300) INJECTION 61% COMPARISON:  02/03/2017 and CT of the chest on 04/21/2013 FINDINGS: Cardiovascular: There is extensive coronary artery calcification. Left atrial appendage clip is in place. Mitral annulus calcification. The heart is mildly enlarged. No pericardial effusion. Bovine arch anatomy. There is narrowing of the superior vena cava secondary to mediastinal mass described below. There is filling and numerous collateral vessels. Left upper extremity contrast injection. Mediastinum/Nodes: Superior mediastinal mass is 5.4 x 7.1 x 6.0 cm. Mass results in significant narrowing of the superior vena cava and has a long interface with the ascending aorta, trachea, superior vena cava, and right brachiocephalic artery. Mass is contiguous with the right hilar structures and encases the trachea and right mainstem bronchus. Small mediastinal lymph nodes are identified in the subcarinal region (1.6 cm) left hilar region (1.0 cm). Lungs/Pleura: Multiple small pulmonary nodules are present. In the right upper lobe, a 6 mm nodule is identified on image 39 of series 7. In the right upper lobe a 4 mm nodule is identified on image 50 of series 7. In the left lower lobe, a 5 mm nodule is identified on image 90 of series 7. There are no pleural effusions or focal consolidations. Upper Abdomen: Multiple calcified granulomata are identified within the spleen. Patient has a lap band in place around the proximal stomach. A small low-attenuation lesion is  identified in the left hepatic lobe measuring 9 mm on image 117 of series 3, stable in appearance and consistent with cyst. Small calcified hepatic granulomata are present. The adrenal glands are normal in appearance. Bilateral intrarenal calculi. Musculoskeletal: Degenerative changes are seen in the thoracic spine. No suspicious lytic or blastic lesions are identified. IMPRESSION: 1. Large superior mediastinal mass contiguous with the right hilar region resulting in significant narrowing of the superior vena cava and collateral vessels filling. Findings are suspicious for malignancy, either primary secondary. 2. Small pulmonary nodules as described, largest measuring 6 mm in the right upper lobe. 3. Coronary artery disease. Mitral annulus calcification. Cardiomegaly. 4. Bovine arch anatomy. 5. Prior granulomatous disease. Numerous calcified granulomata within the liver and spleen. 6. Hepatic cyst. Electronically Signed   By: ENolon NationsM.D.   On: 02/03/2017 17:30    STUDIES:  CT chest 5/14 >  large mediastinal mass extending into the right hilar region with narrowing of the SVC with narrowing of SVC and collateral vessels. Multiple small pulmonary nodules. I reviewed all images personally.  SIGNIFICANT EVENTS  5/14 > admit.  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 Recommend proceeding with bronchoscopy, EBUS  guided biopsy under GA We have scheduled the procedure for Friday at 1 PM pending improvement in INR (2.5 today) Continue holding Coumadin  All possible risks of procedure were discussed extensively with the patient and she wishes to go ahead  More then 1/2 the time of the 40 min visit was spent in counseling and/or coordination of care with the patient.  Marshell Garfinkel MD [place]  [facility] Pager [phone] If no answer or after 3pm call: [phone] 02/04/2017, 2:41 PM

## 2017-02-04 NOTE — Progress Notes (Signed)
    SUBJECTIVE: The patient is feeling well today, slept well.  At this time, she denies chest pain, shortness of breath, or any new concerns.  Marland Kitchen amiodarone  400 mg Oral TID  . cycloSPORINE  1 drop Both Eyes BID  . levothyroxine  175 mcg Oral Once per day on Sun Mon Tue Wed Thu  . [START ON 02/07/2017] levothyroxine  87.5 mcg Oral Once per day on Fri  . sodium chloride flush  3 mL Intravenous Q12H     OBJECTIVE: Physical Exam: Vitals:   02/03/17 1800 02/03/17 2000 02/03/17 2129 02/04/17 0511  BP: 138/65 (!) 152/61 (!) 133/47 (!) 127/55  Pulse: 71 81 85 82  Resp: '12 16 18 18  '$ Temp:   97.7 F (36.5 C) 97.6 F (36.4 C)  TempSrc:   Oral Oral  SpO2: 98% 99% 98%   Weight:   224 lb 8 oz (101.8 kg)   Height:       No intake or output data in the 24 hours ending 02/04/17 4373  Telemetry is reviewed by myself: SR this morning, 70's  GEN- The patient is well appearing, alert and oriented x 3 today.   Head- normocephalic, atraumatic Eyes-  Sclera clear, conjunctiva pink Ears- hearing intact Oropharynx- clear Neck- supple, no JVP Lungs- CTA b/l, normal work of breathing Heart- RRR, no significant murmurs, no rubs or gallops GI- obese, soft, NT, ND Extremities- no clubbing, cyanosis, or edema Skin- no rash or lesion Psych- euthymic mood, full affect Neuro- no gross deficits appreciated  LABS: Basic Metabolic Panel:  Recent Labs  02/03/17 0947 02/04/17 0204  NA 141 139  K 4.4 3.5  CL 105 106  CO2 26 26  GLUCOSE 100* 102*  BUN 21* 18  CREATININE 1.09* 0.94  CALCIUM 9.6 8.7*   Liver Function Tests:  Recent Labs  02/04/17 0204  AST 18  ALT 16  ALKPHOS 65  BILITOT 0.8  PROT 5.8*  ALBUMIN 3.2*   CBC:  Recent Labs  02/03/17 0947  WBC 7.8  HGB 13.7  HCT 44.1  MCV 90.2  PLT 227   Thyroid Function Tests:  Recent Labs  02/04/17 0204  TSH 0.202*     ASSESSMENT AND PLAN:   1. mediastinal mass, this and likely SVC syndrome is etiology of her SOB and  upper body swelling     Medicine and pulmonary team w/u in progress, pending biopsy, timing pending coag correction and OR availability  2. Paroxysmal AFib     SR this AM, will defer a/c to pharmacy and primary team pending biopsy     Continue amiodarone load, SR this morning  3. PPM     Intact function  4. Hypothyorid (s/p thyroidectomy)     Low TSH     Deferred to medicine team        Tommye Standard, PA-C 02/04/2017 8:26 AM

## 2017-02-04 NOTE — Progress Notes (Signed)
[facility] PROGRESS NOTE  Kayla Harrison ZOX:[id number] DOB: May 18, 1946 DOA: 02/03/2017  PCP: [name], MD  Brief History/Interval Summary: 71 year old Caucasian female with a past medical history breast and thyroid cancer, prior history of CVA, history of paroxysmal atrial fibrillation, anticoagulated with warfarin, history of pacemaker placement, presented with complaints of shortness of breath, dizziness and facial swelling. Evaluation revealed a mediastinal mass causing compression of the superior vena cava. Patient was hospitalized for further management.  Reason for Visit: Mediastinal mass with SVC syndrome  Consultants: Cardiology. Pulmonology.  Procedures: None yet  Antibiotics: None  Subjective/Interval History: Patient denies chest pain or shortness of breath this morning. Denies any dizziness. Overall, she feels better.  ROS: No nausea or vomiting  Objective:  Vital Signs  Vitals:   02/03/17 2000 02/03/17 2129 02/04/17 0511 02/04/17 1000  BP: (!) 152/61 (!) 133/47 (!) 127/55 120/65  Pulse: 81 85 82 86  Resp: '16 18 18   '$ Temp:  97.7 F (36.5 C) 97.6 F (36.4 C)   TempSrc:  Oral Oral   SpO2: 99% 98%    Weight:  101.8 kg (224 lb 8 oz)    Height:       No intake or output data in the 24 hours ending 02/04/17 1324 Filed Weights   02/03/17 0945 02/03/17 2129  Weight: 104.3 kg (230 lb) 101.8 kg (224 lb 8 oz)    General appearance: alert, cooperative, appears stated age and no distress Facial flushing noted when she was asked to raise her arms Resp: clear to auscultation bilaterally Cardio: regular rate and rhythm, S1, S2 normal, no murmur, click, rub or gallop GI: soft, non-tender; bowel sounds normal; no masses,  no organomegaly Extremities: extremities normal, atraumatic, no cyanosis or edema Neurologic: No obvious focal neurological deficits  Lab Results:  Data Reviewed: I have personally reviewed following labs and imaging  studies  CBC:  Recent Labs Lab 02/03/17 0947  WBC 7.8  HGB 13.7  HCT 44.1  MCV 90.2  PLT 811    Basic Metabolic Panel:  Recent Labs Lab 02/03/17 0947 02/04/17 0204  NA 141 139  K 4.4 3.5  CL 105 106  CO2 26 26  GLUCOSE 100* 102*  BUN 21* 18  CREATININE 1.09* 0.94  CALCIUM 9.6 8.7*    GFR: Estimated Creatinine Clearance: 65.2 mL/min (by C-G formula based on SCr of 0.94 mg/dL).  Liver Function Tests:  Recent Labs Lab 02/04/17 0204  AST 18  ALT 16  ALKPHOS 65  BILITOT 0.8  PROT 5.8*  ALBUMIN 3.2*    Coagulation Profile:  Recent Labs Lab 02/03/17 0947 02/04/17 0204  INR 2.06 2.51    Thyroid Function Tests:  Recent Labs  02/04/17 0204  TSH 0.202*     Radiology Studies: Dg Chest 2 View  Result Date: 02/03/2017 CLINICAL DATA:  71 year old female with increased shortness breath over the past 5-6 days. Thyroid cancer. Breast cancer. Initial encounter. EXAM: CHEST  2 VIEW COMPARISON:  06/30/2015 chest x-ray FINDINGS: Abnormality right paratracheal region suspicious for mass. Vascular abnormality secondary consideration. Chest CT recommended for further evaluation. Post thyroidectomy. Prior clipping left atrium. Sequential pacemaker in place. Heart size within normal limits. No infiltrate, congestive heart failure or pneumothorax. Erosion distal right clavicle possibly degenerative in origin. Calcified aorta. IMPRESSION: Abnormality right paratracheal region suspicious for mass. Vascular abnormality secondary consideration. Chest CT recommended for further evaluation. Post thyroidectomy and breast surgery. Prior clipping left atrium. Sequential pacemaker in place. Heart size within normal limits.  No infiltrate or congestive heart failure. Erosion distal right clavicle possibly degenerative in origin. These results were called by telephone at the time of interpretation on 02/03/2017 at 10:11 am to Dr. Lajean Saver , who verbally acknowledged these results.  Electronically Signed   By: Genia Del M.D.   On: 02/03/2017 10:15   Ct Chest W Contrast  Result Date: 02/03/2017 CLINICAL DATA:  Pt c/o difficulty breathing dizziness syncope, Afib off and on but on almost all day today. History of thyroid cancer and breast cancer. EXAM: CT CHEST WITH CONTRAST TECHNIQUE: Multidetector CT imaging of the chest was performed during intravenous contrast administration. CONTRAST:  13m ISOVUE-300 IOPAMIDOL (ISOVUE-300) INJECTION 61% COMPARISON:  02/03/2017 and CT of the chest on 04/21/2013 FINDINGS: Cardiovascular: There is extensive coronary artery calcification. Left atrial appendage clip is in place. Mitral annulus calcification. The heart is mildly enlarged. No pericardial effusion. Bovine arch anatomy. There is narrowing of the superior vena cava secondary to mediastinal mass described below. There is filling and numerous collateral vessels. Left upper extremity contrast injection. Mediastinum/Nodes: Superior mediastinal mass is 5.4 x 7.1 x 6.0 cm. Mass results in significant narrowing of the superior vena cava and has a long interface with the ascending aorta, trachea, superior vena cava, and right brachiocephalic artery. Mass is contiguous with the right hilar structures and encases the trachea and right mainstem bronchus. Small mediastinal lymph nodes are identified in the subcarinal region (1.6 cm) left hilar region (1.0 cm). Lungs/Pleura: Multiple small pulmonary nodules are present. In the right upper lobe, a 6 mm nodule is identified on image 39 of series 7. In the right upper lobe a 4 mm nodule is identified on image 50 of series 7. In the left lower lobe, a 5 mm nodule is identified on image 90 of series 7. There are no pleural effusions or focal consolidations. Upper Abdomen: Multiple calcified granulomata are identified within the spleen. Patient has a lap band in place around the proximal stomach. A small low-attenuation lesion is identified in the left hepatic  lobe measuring 9 mm on image 117 of series 3, stable in appearance and consistent with cyst. Small calcified hepatic granulomata are present. The adrenal glands are normal in appearance. Bilateral intrarenal calculi. Musculoskeletal: Degenerative changes are seen in the thoracic spine. No suspicious lytic or blastic lesions are identified. IMPRESSION: 1. Large superior mediastinal mass contiguous with the right hilar region resulting in significant narrowing of the superior vena cava and collateral vessels filling. Findings are suspicious for malignancy, either primary secondary. 2. Small pulmonary nodules as described, largest measuring 6 mm in the right upper lobe. 3. Coronary artery disease. Mitral annulus calcification. Cardiomegaly. 4. Bovine arch anatomy. 5. Prior granulomatous disease. Numerous calcified granulomata within the liver and spleen. 6. Hepatic cyst. Electronically Signed   By: ENolon NationsM.D.   On: 02/03/2017 17:30     Medications:  Scheduled: . amiodarone  400 mg Oral TID  . cycloSPORINE  1 drop Both Eyes BID  . levothyroxine  175 mcg Oral Once per day on Sun Mon Tue Wed Thu  . [START ON 02/07/2017] levothyroxine  87.5 mcg Oral Once per day on Fri  . sodium chloride flush  3 mL Intravenous Q12H   Continuous:  PVQM:[id number]**OR** acetaminophen, ondansetron **OR** ondansetron (ZOFRAN) IV  Assessment/Plan:  Principal Problem:   Lung mass Active Problems:   Atrial fibrillation ([place]   Long term (current) use of anticoagulants   Hypothyroidism   Superior vena cava syndrome  Abnormal chest x-ray    Superior mediastinal mass compressing SVC/SVC syndrome Patient seen by pulmonology. Plan is for the patient to undergo endobronchial ultrasound when her INR is below 1.5. Warfarin has been held.  History of paroxysmal atrial fibrillation. Followed by cardiology. Patient is on amiodarone. She did have spurts of atrial fibrillation yesterday and was given boluses of  amiodarone. She was also given digoxin. Heart rate seems to be better controlled. TSH noted to be low. We will check free T4 tomorrow.  History of hypothyroidism.  TSH was noted to be low, as mentioned above. Free T4 to be checked. Continue levothyroxine.  Previous history of breast and thyroid cancer Stable. CT scan also showed lung nodules. The findings on the CT could be primary lung, but could also be metastatic. No mention of the appearance of the adrenal glands on CT scan report. Will need to wait on pathology once biopsy has been done.   DVT Prophylaxis: Therapeutic INR. Warfarin has been held.    Code Status: Full code  Family Communication: Discussed with the patient  Disposition Plan: Management as outlined above    LOS: 1 day   [facility] Pager [phone] 02/04/2017, 1:24 PM  If 7PM-7AM, please contact night-coverage at [email or web address], password [facility]

## 2017-02-04 NOTE — Progress Notes (Signed)
[place] for heparin Indication: atrial fibrillation  Heparin Dosing Weight: 83.2 kg   Assessment: 57 yof with hx of afib on warfarin PTA (EP notes state intolerant of DOACs). Pt with likely lymphoma and will need biopsy per MD notes. Pharmacy consulted to start heparin (Warfarin on hold).  -INR= 2.51  Goal of Therapy:  Heparin level 0.3-0.7 units/ml Monitor platelets by anticoagulation protocol: Yes   Plan:  Heparin when INR<2 Daily INR Warfarin on hold - f/u plans for biopsy  Hildred Laser, Pharm D 02/04/2017 8:12 AM

## 2017-02-04 NOTE — Progress Notes (Signed)
Patient Name: Kayla Harrison      SUBJECTIVE: slept well Breathing better slept well  Pulm saw last night, and raised concern re recurrent breast CA FNAsp Bx pending " when Af under control"     Past Medical History:  Diagnosis Date  . Arthritis    osteoarthritis - knees   . Atrial fibrillation [facility])    ablation- 2x's-- 1st time- [facility], 2nd event at [facility] in 2008. Convergent ablation at [facility] 6/14  . Breast cancer ([place])    Dr Margot Chimes, total thyroidectomy- 1999- for cancer  . Colon polyp    Dr Earlean Shawl  . Complication of anesthesia    Ketamine produces LSD reaction, bright colored nightmarish experience   . Dyslipidemia   . Dysrhythmia    a fib with internal/external ablation, currently NSR  . Hepatitis    [place]patitis   . Hx of thyroid cancer    Dr Forde Dandy  . Hyperlipidemia   . Hypothyroidism   . Morbid obesity ([place])    Status post lap band surgery  . Normal cardiac stress test    2008- cardiac stress/echo  . Pacemaker-[employer]   . Pneumonia 2010   [facility]- during that event had to be cardioverted   . Renal calculi   . Sinus node dysfunction ([place])    Complicating convergent ablation 6/14  . Stroke [facility])    2003- [place]     Scheduled Meds:  Scheduled Meds: . amiodarone  400 mg Oral TID  . cycloSPORINE  1 drop Both Eyes BID  . levothyroxine  175 mcg Oral Once per day on Sun Mon Tue Wed Thu  . [START ON 02/07/2017] levothyroxine  87.5 mcg Oral Once per day on Fri  . sodium chloride flush  3 mL Intravenous Q12H   Continuous Infusions: acetaminophen **OR** acetaminophen, ondansetron **OR** ondansetron (ZOFRAN) IV    PHYSICAL EXAM Vitals:   02/03/17 1800 02/03/17 2000 02/03/17 2129 02/04/17 0511  BP: 138/65 (!) 152/61 (!) 133/47 (!) 127/55  Pulse: 71 81 85 82  Resp: '12 16 18 18  '$ Temp:   97.7 F (36.5 C) 97.6 F (36.4 C)  TempSrc:   Oral Oral  SpO2: 98% 99% 98%   Weight:   224 lb 8 oz (101.8 kg)     Height:       Well developed and nourished in no acute distress HENT normal Neck supple with JVP-flat Clear Regular rate and rhythm, no murmurs or gallops Abd-soft with active BS No Clubbing cyanosis edema Skin-warm and dry A & Oriented  Grossly normal sensory and motor function   TELEMETRY: Reviewed personnaly  pt in paroxysmal Afib:    No intake or output data in the 24 hours ending 02/04/17 0843  LABS: Basic Metabolic Panel:  Recent Labs Lab 02/03/17 0947 02/04/17 0204  NA 141 139  K 4.4 3.5  CL 105 106  CO2 26 26  GLUCOSE 100* 102*  BUN 21* 18  CREATININE 1.09* 0.94  CALCIUM 9.6 8.7*   Cardiac Enzymes: No results for input(s): CKTOTAL, CKMB, CKMBINDEX, TROPONINI in the last 72 hours. CBC:  Recent Labs Lab 02/03/17 0947  WBC 7.8  HGB 13.7  HCT 44.1  MCV 90.2  PLT 227   PROTIME:  Recent Labs  02/03/17 0947 02/04/17 0204  LABPROT 23.5* 27.6*  INR 2.06 2.51   Liver Function Tests:  Recent Labs  02/04/17 0204  AST 18  ALT 16  ALKPHOS 65  BILITOT 0.8  PROT 5.8*  ALBUMIN 3.2*   No results for input(s): LIPASE, AMYLASE in the last 72 hours. BNP: BNP (last 3 results)  Recent Labs  02/03/17 0947  BNP 70.5    ProBNP (last 3 results) No results for input(s): PROBNP in the last 8760 hours.  D-Dimer: No results for input(s): DDIMER in the last 72 hours. Hemoglobin A1C: No results for input(s): HGBA1C in the last 72 hours. Fasting Lipid Panel: No results for input(s): CHOL, HDL, LDLCALC, TRIG, CHOLHDL, LDLDIRECT in the last 72 hours. Thyroid Function Tests:  Recent Labs  02/04/17 0204  TSH 0.202*     ASSESSMENT AND PLAN:  Principal Problem:   Lung mass Active Problems:   Atrial fibrillation (HCC)   Long term (current) use of anticoagulants   Hypothyroidism   Superior vena cava syndrome   Abnormal chest x-ray  Continue amio for Afib Coumadin stopped and now awaiting normalization of INR for Bx Her BNP is normal making  CHF unlikely Literature reveiws of SVC syndrome all discuss dyspnea, although mechanisms are not clear to me, apart from effusions while common absent here Her Distal SVC opens up nicely so suspect her RA pressure is low-- this may explain some of the dizziness And in this regard the choice of fluids last night was a good one  Appreciate everyones help    Will need to make tissue diagnosis In the absence of stridor or evidence of laryngeal edema I guess this is NON emergent but appreciate pulm expertise here as we seek to assess  Reviewed plan again with pt.   Called and spoke to pulm;; last nights consultant not available    Signed, [name] MD  02/04/2017

## 2017-02-04 NOTE — Progress Notes (Signed)
Since bolus of 500 and digoxin, HR has in the 70's and 80's. Patient has been resting comfortably, will continue to monitor. Call light within reach, bed alarm on.

## 2017-02-05 ENCOUNTER — Inpatient Hospital Stay (HOSPITAL_COMMUNITY): Payer: Medicare Other

## 2017-02-05 DIAGNOSIS — I4891 Unspecified atrial fibrillation: Secondary | ICD-10-CM

## 2017-02-05 DIAGNOSIS — R51 Headache: Secondary | ICD-10-CM

## 2017-02-05 LAB — CBC
HEMATOCRIT: 38.8 % (ref 36.0–46.0)
Hemoglobin: 12 g/dL (ref 12.0–15.0)
MCH: 27.8 pg (ref 26.0–34.0)
MCHC: 30.9 g/dL (ref 30.0–36.0)
MCV: 89.8 fL (ref 78.0–100.0)
Platelets: 204 10*3/uL (ref 150–400)
RBC: 4.32 MIL/uL (ref 3.87–5.11)
RDW: 14.3 % (ref 11.5–15.5)
WBC: 6.1 10*3/uL (ref 4.0–10.5)

## 2017-02-05 LAB — BASIC METABOLIC PANEL
ANION GAP: 8 (ref 5–15)
BUN: 22 mg/dL — ABNORMAL HIGH (ref 6–20)
CALCIUM: 8.6 mg/dL — AB (ref 8.9–10.3)
CO2: 24 mmol/L (ref 22–32)
Chloride: 107 mmol/L (ref 101–111)
Creatinine, Ser: 1.08 mg/dL — ABNORMAL HIGH (ref 0.44–1.00)
GFR, EST AFRICAN AMERICAN: 58 mL/min — AB (ref >60)
GFR, EST NON AFRICAN AMERICAN: 50 mL/min — AB (ref >60)
Glucose, Bld: 122 mg/dL — ABNORMAL HIGH (ref 65–99)
Potassium: 3.7 mmol/L (ref 3.5–5.1)
Sodium: 139 mmol/L (ref 135–145)

## 2017-02-05 LAB — PROTIME-INR
INR: 2.6
Prothrombin Time: 28.4 seconds — ABNORMAL HIGH (ref 11.4–15.2)

## 2017-02-05 LAB — ECHOCARDIOGRAM COMPLETE
Height: 66 in
WEIGHTICAEL: 3592 [oz_av]

## 2017-02-05 LAB — T4, FREE: FREE T4: 1.3 ng/dL — AB (ref 0.61–1.12)

## 2017-02-05 MED ORDER — LEVOTHYROXINE SODIUM 75 MCG PO TABS
150.0000 ug | ORAL_TABLET | ORAL | Status: DC
  Administered 2017-02-06: 150 ug via ORAL
  Filled 2017-02-05 (×2): qty 2

## 2017-02-05 MED ORDER — PHYTONADIONE 5 MG PO TABS
5.0000 mg | ORAL_TABLET | Freq: Once | ORAL | Status: AC
  Administered 2017-02-05: 5 mg via ORAL
  Filled 2017-02-05 (×2): qty 1

## 2017-02-05 MED ORDER — TRAMADOL HCL 50 MG PO TABS: 50.0000 mg | ORAL_TABLET | Freq: Four times a day (QID) | ORAL | Status: DC | PRN

## 2017-02-05 MED ORDER — HYDROCODONE-ACETAMINOPHEN 5-325 MG PO TABS
2.0000 | ORAL_TABLET | Freq: Once | ORAL | Status: AC
  Administered 2017-02-05: 2 via ORAL
  Filled 2017-02-05: qty 2

## 2017-02-05 NOTE — Progress Notes (Signed)
[place] for heparin Indication: atrial fibrillation  Heparin Dosing Weight: 83.2 kg   Assessment: 42 yof with hx of afib on warfarin PTA (EP notes state intolerant of DOACs). Pt with likely lymphoma and will need biopsy per MD notes. Pharmacy consulted to start heparin (Warfarin on hold). Amiodarone load started 5/14.  -INR= 2.6 (last dose taken 5/14)  Goal of Therapy:  Heparin level 0.3-0.7 units/ml Monitor platelets by anticoagulation protocol: Yes   Plan:  Heparin when INR<2 Daily INR Warfarin on hold - f/u plans for biopsy (possibly on Friday)  Hildred Laser, Pharm D 02/05/2017 8:18 AM

## 2017-02-05 NOTE — Progress Notes (Addendum)
[facility] PROGRESS NOTE  Kayla Harrison PZW:[id number] DOB: Sep 14, 1946 DOA: 02/03/2017  PCP: [name], MD  Brief History/Interval Summary: 71 year old Caucasian female with a past medical history breast and thyroid cancer, prior history of CVA, history of paroxysmal atrial fibrillation, anticoagulated with warfarin, history of pacemaker placement, presented with complaints of shortness of breath, dizziness and facial swelling. Evaluation revealed a mediastinal mass causing compression of the superior vena cava. Patient was hospitalized for further management.  Reason for Visit: Mediastinal mass with SVC syndrome  Consultants: Cardiology. Pulmonology.  Procedures: None yet  Antibiotics: None  Subjective/Interval History: Patient developed headache overnight, not relieved with pain medications. She's had headache on and off for many months. Located in the frontal part of the head. No visual disturbances. Also develop some shortness of breath overnight, which is stable now. Denies any chest pain.   ROS: No nausea or vomiting  Objective:  Vital Signs  Vitals:   02/04/17 1000 02/04/17 1400 02/04/17 1938 02/05/17 0338  BP: 120/65 (!) 140/51  (!) 141/54  Pulse: 86 72 70 69  Resp:  '20 19 17  '$ Temp:   98 F (36.7 C) 97.8 F (36.6 C)  TempSrc:   Oral Oral  SpO2:  95% 96% 96%  Weight:      Height:        Intake/Output Summary (Last 24 hours) at 02/05/17 1020 Last data filed at 02/04/17 2130  Gross per 24 hour  Intake              630 ml  Output                0 ml  Net              630 ml   Filed Weights   02/03/17 0945 02/03/17 2129  Weight: 104.3 kg (230 lb) 101.8 kg (224 lb 8 oz)    General appearance: Awake, alert. No distress. Some facial flushing is noted. No stridor. Resp: Clear to auscultation bilaterally. No wheezing, rales or rhonchi. Cardio: S1, S2 is normal, regular. No S3, S4. No respiratory or shortness GI: Abdomen is soft. Nontender,  nondistended. Bowel sounds are present. No masses or organomegaly Extremities: No edema Neurologic: No focal neurological deficits.  Lab Results:  Data Reviewed: I have personally reviewed following labs and imaging studies  CBC:  Recent Labs Lab 02/03/17 0947 02/05/17 0239  WBC 7.8 6.1  HGB 13.7 12.0  HCT 44.1 38.8  MCV 90.2 89.8  PLT 227 423    Basic Metabolic Panel:  Recent Labs Lab 02/03/17 0947 02/04/17 0204 02/05/17 0239  NA 141 139 139  K 4.4 3.5 3.7  CL 105 106 107  CO2 '26 26 24  '$ GLUCOSE 100* 102* 122*  BUN 21* 18 22*  CREATININE 1.09* 0.94 1.08*  CALCIUM 9.6 8.7* 8.6*    GFR: Estimated Creatinine Clearance: 56.7 mL/min (A) (by C-G formula based on SCr of 1.08 mg/dL (H)).  Liver Function Tests:  Recent Labs Lab 02/04/17 0204  AST 18  ALT 16  ALKPHOS 65  BILITOT 0.8  PROT 5.8*  ALBUMIN 3.2*    Coagulation Profile:  Recent Labs Lab 02/03/17 0947 02/04/17 0204 02/05/17 0239  INR 2.06 2.51 2.60    Thyroid Function Tests:  Recent Labs  02/04/17 0204 02/05/17 0239  TSH 0.202*  --   FREET4  --  1.30*     Radiology Studies: Ct Chest W Contrast  Result Date: 02/03/2017 CLINICAL DATA:  Pt c/o  difficulty breathing dizziness syncope, Afib off and on but on almost all day today. History of thyroid cancer and breast cancer. EXAM: CT CHEST WITH CONTRAST TECHNIQUE: Multidetector CT imaging of the chest was performed during intravenous contrast administration. CONTRAST:  60m ISOVUE-300 IOPAMIDOL (ISOVUE-300) INJECTION 61% COMPARISON:  02/03/2017 and CT of the chest on 04/21/2013 FINDINGS: Cardiovascular: There is extensive coronary artery calcification. Left atrial appendage clip is in place. Mitral annulus calcification. The heart is mildly enlarged. No pericardial effusion. Bovine arch anatomy. There is narrowing of the superior vena cava secondary to mediastinal mass described below. There is filling and numerous collateral vessels. Left upper  extremity contrast injection. Mediastinum/Nodes: Superior mediastinal mass is 5.4 x 7.1 x 6.0 cm. Mass results in significant narrowing of the superior vena cava and has a long interface with the ascending aorta, trachea, superior vena cava, and right brachiocephalic artery. Mass is contiguous with the right hilar structures and encases the trachea and right mainstem bronchus. Small mediastinal lymph nodes are identified in the subcarinal region (1.6 cm) left hilar region (1.0 cm). Lungs/Pleura: Multiple small pulmonary nodules are present. In the right upper lobe, a 6 mm nodule is identified on image 39 of series 7. In the right upper lobe a 4 mm nodule is identified on image 50 of series 7. In the left lower lobe, a 5 mm nodule is identified on image 90 of series 7. There are no pleural effusions or focal consolidations. Upper Abdomen: Multiple calcified granulomata are identified within the spleen. Patient has a lap band in place around the proximal stomach. A small low-attenuation lesion is identified in the left hepatic lobe measuring 9 mm on image 117 of series 3, stable in appearance and consistent with cyst. Small calcified hepatic granulomata are present. The adrenal glands are normal in appearance. Bilateral intrarenal calculi. Musculoskeletal: Degenerative changes are seen in the thoracic spine. No suspicious lytic or blastic lesions are identified. IMPRESSION: 1. Large superior mediastinal mass contiguous with the right hilar region resulting in significant narrowing of the superior vena cava and collateral vessels filling. Findings are suspicious for malignancy, either primary secondary. 2. Small pulmonary nodules as described, largest measuring 6 mm in the right upper lobe. 3. Coronary artery disease. Mitral annulus calcification. Cardiomegaly. 4. Bovine arch anatomy. 5. Prior granulomatous disease. Numerous calcified granulomata within the liver and spleen. 6. Hepatic cyst. Electronically Signed   By:  ENolon NationsM.D.   On: 02/03/2017 17:30     Medications:  Scheduled: . amiodarone  400 mg Oral TID  . cycloSPORINE  1 drop Both Eyes BID  . levothyroxine  175 mcg Oral Once per day on Sun Mon Tue Wed Thu  . [START ON 02/07/2017] levothyroxine  87.5 mcg Oral Once per day on Fri  . sodium chloride flush  3 mL Intravenous Q12H   Continuous:  PXVQ:[id number]**OR** acetaminophen, ondansetron **OR** ondansetron (ZOFRAN) IV  Assessment/Plan:  Principal Problem:   Lung mass Active Problems:   Atrial fibrillation ([place]   Long term (current) use of anticoagulants   Hypothyroidism   Superior vena cava syndrome   Abnormal chest x-ray    Superior mediastinal mass compressing SVC/SVC syndrome Patient seen by pulmonology. Plan is for the patient to undergo endobronchial ultrasound when her INR is below 1.5. Warfarin has been held. Patient does mention headaches which have been ongoing for a few months. Headache could be a result of her SVC syndrome. Patient does not have any focal neurological deficits. Proceed with CT head  for now. Her SVC syndrome appears to be between grade 1-2, which is mild-to-moderate. Does not need immediate intervention at this time. Follow up on CT head. Etiology of her mediastinal mass is not entirely clear. Differential diagnosis includes primary lung cancer, lymphoma. She has a history of breast and thyroid cancer so could have recurrence of these as well. Biopsy planned for Friday. Her thyroid cancer was managed with Dr. Forde Dandy, 18 years ago. Breasts cancer was managed by Dr. Humphrey Rolls, 8 years ago, who is no longer in this area.  History of paroxysmal atrial fibrillation. Followed by cardiology. Patient is on amiodarone. She did have spurts of atrial fibrillation and was given boluses of amiodarone. She was also given digoxin. Heart rate seems to be better controlled. TSH noted to be low. Free T4 is slightly elevated. See below. INR is higher today. If she does not  have a downward trend by tomorrow, may have to give her small dose of Vit K if okay with cardiology. ADDENDUM: Discussed with Dr. Vaughan Browner and cardiology. Will give her a dose of Vit k today.  History of hypothyroidism.  TSH is low at 0.20. Free T4 is high at 1.30. Cutback on the dose of levothyroxine. Will need repeat thyroid function tests in 4-6 weeks.  Previous history of breast and thyroid cancer Stable. CT scan also showed lung nodules. The findings on the CT could be primary lung, but could also be metastatic. No mention of the appearance of the adrenal glands on CT scan report. Will need to wait on pathology once biopsy has been done.  DVT Prophylaxis: Therapeutic INR. Warfarin has been held.    Code Status: Full code  Family Communication: Discussed with the patient  Disposition Plan: Management as outlined above    LOS: 2 days   [facility] Pager [phone] 02/05/2017, 10:20 AM  If 7PM-7AM, please contact night-coverage at [email or web address], password [facility]

## 2017-02-05 NOTE — Progress Notes (Signed)
    SUBJECTIVE: The patient is feeling well today.  At this time, she denies chest pain, shortness of breath, or any new concerns.  Marland Kitchen amiodarone  400 mg Oral TID  . cycloSPORINE  1 drop Both Eyes BID  . levothyroxine  175 mcg Oral Once per day on Sun Mon Tue Wed Thu  . [START ON 02/07/2017] levothyroxine  87.5 mcg Oral Once per day on Fri  . sodium chloride flush  3 mL Intravenous Q12H     OBJECTIVE: Physical Exam: Vitals:   02/04/17 1000 02/04/17 1400 02/04/17 1938 02/05/17 0338  BP: 120/65 (!) 140/51  (!) 141/54  Pulse: 86 72 70 69  Resp:  '20 19 17  '$ Temp:   98 F (36.7 C) 97.8 F (36.6 C)  TempSrc:   Oral Oral  SpO2:  95% 96% 96%  Weight:      Height:        Intake/Output Summary (Last 24 hours) at 02/05/17 0755 Last data filed at 02/04/17 2130  Gross per 24 hour  Intake              750 ml  Output                0 ml  Net              750 ml    Telemetry is reviewed by myself: SR this morning, 70's  GEN- The patient is well appearing, alert and oriented x 3 today.   Head- normocephalic, atraumatic Eyes-  Sclera clear, conjunctiva pink Ears- hearing intact Oropharynx- clear Neck- supple, no JVP Lungs- CTA b/l, normal work of breathing Heart- RRR, no significant murmurs, no rubs or gallops GI- obese, soft, NT, ND Extremities- no clubbing, cyanosis, or edema Skin- no rash or lesion Psych- euthymic mood, full affect Neuro- no gross deficits appreciated  LABS: Basic Metabolic Panel:  Recent Labs  02/04/17 0204 02/05/17 0239  NA 139 139  K 3.5 3.7  CL 106 107  CO2 26 24  GLUCOSE 102* 122*  BUN 18 22*  CREATININE 0.94 1.08*  CALCIUM 8.7* 8.6*   Liver Function Tests:  Recent Labs  02/04/17 0204  AST 18  ALT 16  ALKPHOS 65  BILITOT 0.8  PROT 5.8*  ALBUMIN 3.2*   CBC:  Recent Labs  02/03/17 0947 02/05/17 0239  WBC 7.8 6.1  HGB 13.7 12.0  HCT 44.1 38.8  MCV 90.2 89.8  PLT 227 204   Thyroid Function Tests:  Recent Labs   02/04/17 0204  TSH 0.202*     ASSESSMENT AND PLAN:   1. mediastinal mass, this and likely SVC syndrome is etiology of her SOB and upper body swelling     Medicine and pulmonary team w/u in progress     pending biopsy, timing pending coag correction and OR availability, looks tentatively for Friday     INR 2.6 (possibly the amiodarone contributes to persistent INR elevation)  2. Paroxysmal AFib     SR this AM, will defer a/c to pharmacy and primary team pending biopsy     Continue amiodarone load, remains in SR this morning  3. PPM     Intact function  4. Hypothyorid (s/p thyroidectomy)     Low TSH     Deferred to medicine team       No new recommendations from EP standpoint  Tommye Standard, PA-C 02/05/2017 7:55 AM

## 2017-02-05 NOTE — Progress Notes (Signed)
  Echocardiogram 2D Echocardiogram has been performed.  Kayla Harrison 02/05/2017, 10:12 AM

## 2017-02-05 NOTE — Progress Notes (Signed)
Name: Kayla Harrison MRN: [id number] DOB: June 11, 1946    ADMISSION DATE:  02/03/2017 CONSULTATION DATE:  02/03/17  REFERRING Harrison :  Kayla Harrison  CHIEF COMPLAINT:  Facial swelling, SOB   HISTORY OF PRESENT ILLNESS:  Kayla Harrison is a 71 y.o. female with a PMH as outlined below including but not limited to breast CA (s/p lumpectomy and XRT 2010), thyroid CA (s/p thyroidectomy 1999).  She presented to [facility] ED on 5/14 with worsening facial swelling along with SOB. She states that facial swelling started some 3 weeks ago and has persisted. She initially thought that this was due to the pollen so did not think much of it; however as she had no additional allergic symptoms along with persistence and swelling and new onset of gradual SOB, she knew that something else was going on. On 5/13, she had worsening of symptoms; therefore, 5/14 she came to the ED for further evaluation.  She was found to have PAF with RVR. She was seen by EP and was started on amiodarone.  Additionally, CT of the chest demonstrated a large superior mediastinal mass contiguous with the right hilar region resulting in significant narrowing of the SVC and collateral vessels. Findings very suspicious for malignancy. Also, small pulmonary nodules, largest 31m in RUL.  Given her CT findings, PCCM was asked to see in consultation. She denies any weight loss, hemoptysis. She is a non-smoker and has never smoked in her life. She had lumpectomy and radiation in 2010 for breast cancer and also thyroidectomy 1999 for thyroid cancer. Otherwise, no additional history of malignancies.  PAST MEDICAL HISTORY :   has a past medical history of Arthritis; Atrial fibrillation ([place]; Breast cancer ([place]; Colon polyp; Complication of anesthesia; Dyslipidemia; Dysrhythmia; Hepatitis; thyroid cancer; Hyperlipidemia; Hypothyroidism; Morbid [place] ([place]; Normal cardiac stress test; Pacemaker-[employer]; Pneumonia (2010); Renal calculi; Sinus node dysfunction  (HCC); and Stroke ([place].  has a past surgical history that includes Thyroidectomy (1998); bso (1998); Cholecystectomy; Tonsillectomy; Colonoscopy w/ polypectomy; afib ablation; Abdominal hysterectomy (1983); Breast lumpectomy (2010); Laparoscopic gastric banding (07/10/2010); Appendectomy; Knee arthroscopy; laparotomy; Total knee arthroplasty (04/13/2012); Joint replacement (04/13/12); Cardioversion (10/09/2012); Cardioversion (10/09/2012); Cardioversion (N/A, 11/20/2012); Transabdominal ablation (03/02/13); Pacemaker insertion (03/10/13); convergent; pocket revision (N/A, 12/08/2013); Eye surgery; Cystoscopy with retrograde pyelogram, ureteroscopy and stent placement (Right, 02/06/2015); Holmium laser application (N/A, 54/23/5361; and Cystoscopy (N/A, 02/06/2015). Prior to Admission medications   Medication Sig Start Date End Date Taking? Authorizing Provider  bisoprolol (ZEBETA) 5 MG tablet Take 1/2 tablet (2.5 mg) by mouth once daily 12/25/16  Yes Kayla Sprang Harrison  cycloSPORINE (RESTASIS) 0.05 % ophthalmic emulsion Place 1 drop into both eyes 2 (two) times daily.   Yes Kayla Harrison  furosemide (LASIX) 40 MG tablet Take 40 mg by mouth daily as needed for fluid.    Yes Kayla Harrison  levothyroxine (SYNTHROID, LEVOTHROID) 175 MCG tablet Take 87.5-175 mcg by mouth daily before breakfast. TAKE 1 TABLET BY MOUTH EVERY DAY EXCEPT, TAKE 1/2 TABLET ON Friday AND NO TABLET ON Saturday   Yes Kayla Harrison  Lifitegrast (Shirley Friar 5 % SOLN Apply 1 drop to eye 2 (two) times daily.   Yes Kayla Harrison  Multiple Vitamin (MULTIVITAMIN) tablet Take 1 tablet by mouth daily.    Yes Kayla Harrison  warfarin (COUMADIN) 5 MG tablet TAKE AS DIRECTED BY  [facility] CL[facility] 10/07/16  Yes Kayla Sprang Harrison   Allergies  Allergen Reactions  . Tikosyn [Dofetilide]     Prolonged QT interval  Prolonged QT interval  . Xarelto [Rivaroxaban]     Nose Bleed X 6 hrs ; packing in ER  .  Epinephrine Other (See Comments) and Rash    Dental form only (liquid). Patient stated she will become "out of it" she can hear you but cannot respond in normal fashion.  Marland Kitchen Ketamine     Hallucinations  . Eliquis [Apixaban] Other (See Comments)    BLEEDING IN URINE    FAMILY HISTORY:  family history includes Cancer in her father; Diabetes in her brother, paternal aunt, paternal grandmother, and sister; Heart disease (age of onset: 53) in her father. SOCIAL HISTORY:  reports that she has never smoked. She has never used smokeless tobacco. She reports that she does not drink alcohol or use drugs.  REVIEW OF SYSTEMS:   All negative; except for those that are bolded, which indicate positives.  Constitutional: weight loss, weight gain, night sweats, fevers, chills, fatigue, weakness.  HEENT: headaches, sore throat, sneezing, nasal congestion, Harrison nasal drip, difficulty swallowing, tooth/dental problems, visual complaints, visual changes, ear aches, facial swelling. Neuro: difficulty with speech, weakness, numbness, ataxia. CV:  chest pain, orthopnea, PND, swelling in lower extremities, dizziness, palpitations, syncope.  Resp: cough, hemoptysis, dyspnea, wheezing. GI: heartburn, indigestion, abdominal pain, nausea, vomiting, diarrhea, constipation, change in bowel habits, loss of appetite, hematemesis, melena, hematochezia.  GU: dysuria, change in color of urine, urgency or frequency, flank pain, hematuria. MSK: joint pain or swelling, decreased range of motion. Psych: change in mood or affect, depression, anxiety, suicidal ideations, homicidal ideations. Skin: rash, itching, bruising.  SUBJECTIVE:   No issues overnight  VITAL SIGNS: Temp:  [97.8 F (36.6 C)-98 F (36.7 C)] 97.8 F (36.6 C) (05/16 0338) Pulse Rate:  [69-70] 69 (05/16 0338) Resp:  [17-19] 17 (05/16 0338) BP: (141)/(54) 141/54 (05/16 0338) SpO2:  [96 %] 96 % (05/16 0338)  PHYSICAL EXAMINATION: Blood pressure (!)  141/54, pulse 69, temperature 97.8 F (36.6 C), temperature source Oral, resp. rate 17, height '5\' 6"'$  (1.676 m), weight 224 lb 8 oz (101.8 kg), SpO2 96 %. Gen:      No acute distress HEENT:  EOMI, sclera anicteric Neck:     No masses; no thyromegaly Lungs:    Clear to auscultation bilaterally; normal respiratory effort CV:         Regular rate and rhythm; no murmurs Abd:      + bowel sounds; soft, non-tender; no palpable masses, no distension Ext:    No edema; adequate peripheral perfusion Skin:      Warm and dry; no rash Neuro: alert and oriented x 3 Psych: normal mood and affect   Recent Labs Lab 02/03/17 0947 02/04/17 0204 02/05/17 0239  NA 141 139 139  Kayla 4.4 3.5 3.7  CL 105 106 107  CO2 '26 26 24  '$ BUN 21* 18 22*  CREATININE 1.09* 0.94 1.08*  GLUCOSE 100* 102* 122*    Recent Labs Lab 02/03/17 0947 02/05/17 0239  HGB 13.7 12.0  HCT 44.1 38.8  WBC 7.8 6.1  PLT 227 204   Ct Head Wo Contrast  Result Date: 02/05/2017 CLINICAL DATA:  Headache for 1 month. EXAM: CT HEAD WITHOUT CONTRAST TECHNIQUE: Contiguous axial images were obtained from the base of the skull through the vertex without intravenous contrast. COMPARISON:  04/09/2016 FINDINGS: Brain: No evidence of acute infarction, hemorrhage, hydrocephalus, extra-axial collection or mass lesion/mass effect. Chronic small-vessel white matter ischemic changes again noted. Vascular: Intracranial atherosclerotic calcifications again identified. Skull: Normal. Negative for  fracture or focal lesion. Sinuses/Orbits: No acute finding. Other: None. IMPRESSION: No evidence of acute intracranial abnormality. Chronic small-vessel white matter ischemic changes. Electronically Signed   By: Margarette Canada M.D.   On: 02/05/2017 14:01   Ct Chest W Contrast  Result Date: 02/03/2017 CLINICAL DATA:  Pt c/o difficulty breathing dizziness syncope, Afib off and on but on almost all day today. History of thyroid cancer and breast cancer. EXAM: CT CHEST  WITH CONTRAST TECHNIQUE: Multidetector CT imaging of the chest was performed during intravenous contrast administration. CONTRAST:  15m ISOVUE-300 IOPAMIDOL (ISOVUE-300) INJECTION 61% COMPARISON:  02/03/2017 and CT of the chest on 04/21/2013 FINDINGS: Cardiovascular: There is extensive coronary artery calcification. Left atrial appendage clip is in place. Mitral annulus calcification. The heart is mildly enlarged. No pericardial effusion. Bovine arch anatomy. There is narrowing of the superior vena cava secondary to mediastinal mass described below. There is filling and numerous collateral vessels. Left upper extremity contrast injection. Mediastinum/Nodes: Superior mediastinal mass is 5.4 x 7.1 x 6.0 cm. Mass results in significant narrowing of the superior vena cava and has a long interface with the ascending aorta, trachea, superior vena cava, and right brachiocephalic artery. Mass is contiguous with the right hilar structures and encases the trachea and right mainstem bronchus. Small mediastinal lymph nodes are identified in the subcarinal region (1.6 cm) left hilar region (1.0 cm). Lungs/Pleura: Multiple small pulmonary nodules are present. In the right upper lobe, a 6 mm nodule is identified on image 39 of series 7. In the right upper lobe a 4 mm nodule is identified on image 50 of series 7. In the left lower lobe, a 5 mm nodule is identified on image 90 of series 7. There are no pleural effusions or focal consolidations. Upper Abdomen: Multiple calcified granulomata are identified within the spleen. Patient has a lap band in place around the proximal stomach. A small low-attenuation lesion is identified in the left hepatic lobe measuring 9 mm on image 117 of series 3, stable in appearance and consistent with cyst. Small calcified hepatic granulomata are present. The adrenal glands are normal in appearance. Bilateral intrarenal calculi. Musculoskeletal: Degenerative changes are seen in the thoracic spine. No  suspicious lytic or blastic lesions are identified. IMPRESSION: 1. Large superior mediastinal mass contiguous with the right hilar region resulting in significant narrowing of the superior vena cava and collateral vessels filling. Findings are suspicious for malignancy, either primary secondary. 2. Small pulmonary nodules as described, largest measuring 6 mm in the right upper lobe. 3. Coronary artery disease. Mitral annulus calcification. Cardiomegaly. 4. Bovine arch anatomy. 5. Prior granulomatous disease. Numerous calcified granulomata within the liver and spleen. 6. Hepatic cyst. Electronically Signed   By: ENolon NationsM.D.   On: 02/03/2017 17:30    STUDIES:  CT chest 5/14 >  large mediastinal mass extending into the right hilar region with narrowing of the SVC with narrowing of SVC and collateral vessels. Multiple small pulmonary nodules. I re reviewed all images personally.  SIGNIFICANT EVENTS  5/14 > admit.  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 We have scheduled EBUS procedure for Friday at 1 PM pending improvement in INR Continue holding Coumadin. INR is higher. Consider vit Kayla if ok with cardiology.  PMarshell GarfinkelMD [facility] Pager [phone] no answer or  after 3pm call: [phone] 02/05/2017, 2:49 PM

## 2017-02-06 ENCOUNTER — Ambulatory Visit
Admit: 2017-02-06 | Discharge: 2017-02-06 | Disposition: A | Discharge status: Home / Self Care | Payer: Medicare Other | Source: Ambulatory Visit | Attending: Radiation Oncology | Admitting: Radiation Oncology

## 2017-02-06 ENCOUNTER — Telehealth: Payer: Self-pay | Admitting: *Deleted

## 2017-02-06 ENCOUNTER — Ambulatory Visit
Admit: 2017-02-06 | Discharge: 2017-02-06 | Disposition: A | Discharge status: Home / Self Care | Payer: Medicare Other | Attending: Radiation Oncology | Admitting: Radiation Oncology

## 2017-02-06 DIAGNOSIS — J9859 Other diseases of mediastinum, not elsewhere classified: Secondary | ICD-10-CM | POA: Insufficient documentation

## 2017-02-06 DIAGNOSIS — C383 Malignant neoplasm of mediastinum, part unspecified: Secondary | ICD-10-CM

## 2017-02-06 DIAGNOSIS — Z510 Encounter for antineoplastic radiation therapy: Secondary | ICD-10-CM | POA: Insufficient documentation

## 2017-02-06 DIAGNOSIS — C8332 Diffuse large B-cell lymphoma, intrathoracic lymph nodes: Secondary | ICD-10-CM | POA: Insufficient documentation

## 2017-02-06 DIAGNOSIS — C8522 Mediastinal (thymic) large B-cell lymphoma, intrathoracic lymph nodes: Secondary | ICD-10-CM

## 2017-02-06 LAB — CBC
HCT: 38.3 % (ref 36.0–46.0)
Hemoglobin: 12.1 g/dL (ref 12.0–15.0)
MCH: 28.6 pg (ref 26.0–34.0)
MCHC: 31.6 g/dL (ref 30.0–36.0)
MCV: 90.5 fL (ref 78.0–100.0)
PLATELETS: 194 10*3/uL (ref 150–400)
RBC: 4.23 MIL/uL (ref 3.87–5.11)
RDW: 14.6 % (ref 11.5–15.5)
WBC: 6.2 10*3/uL (ref 4.0–10.5)

## 2017-02-06 LAB — PROTIME-INR
INR: 2.1
INR: 1.56
PROTHROMBIN TIME: 18.8 s — AB (ref 11.4–15.2)
Prothrombin Time: 23.9 seconds — ABNORMAL HIGH (ref 11.4–15.2)

## 2017-02-06 LAB — BASIC METABOLIC PANEL
Anion gap: 5 (ref 5–15)
BUN: 22 mg/dL — ABNORMAL HIGH (ref 6–20)
CO2: 27 mmol/L (ref 22–32)
Calcium: 9 mg/dL (ref 8.9–10.3)
Chloride: 108 mmol/L (ref 101–111)
Creatinine, Ser: 0.95 mg/dL (ref 0.44–1.00)
GFR calc Af Amer: >60 mL/min (ref >60)
GFR, EST NON AFRICAN AMERICAN: 58 mL/min — AB (ref >60)
GLUCOSE: 110 mg/dL — AB (ref 65–99)
POTASSIUM: 4.2 mmol/L (ref 3.5–5.1)
SODIUM: 140 mmol/L (ref 135–145)

## 2017-02-06 NOTE — Telephone Encounter (Signed)
Called [facility]  [phone] spoke with Abbe Amsterdam, he will sending trasnport ation to get the patient in a few minute to bring to Radi[facility] oncology dept at [place], thanked PHil  1:52 PM

## 2017-02-06 NOTE — Progress Notes (Signed)
[facility] PROGRESS NOTE  Kayla Harrison ERD:[id number] DOB: 1946/05/03 DOA: 02/03/2017  PCP: [name], MD  Brief History/Interval Summary: 71 year old Caucasian female with a past medical history breast and thyroid cancer, prior history of CVA, history of paroxysmal atrial fibrillation, anticoagulated with warfarin, history of pacemaker placement, presented with complaints of shortness of breath, dizziness and facial swelling. Evaluation revealed a mediastinal mass causing compression of the superior vena cava. Patient was hospitalized for further management.  Reason for Visit: Mediastinal mass with SVC syndrome  Consultants: Cardiology. Pulmonology.  Procedures: None yet  Antibiotics: None  Subjective/Interval History: Patient feels better this morning, though she continues to have headaches occasionally. She also has shortness of breath occasionally. Denies any chest pain. Has been ambulating without any difficulty.  ROS: No nausea or vomiting  Objective:  Vital Signs  Vitals:   02/05/17 0338 02/05/17 1449 02/05/17 2028 02/06/17 0347  BP: (!) 141/54 (!) 136/52 (!) 127/59 (!) 108/52  Pulse: 69 71 72 70  Resp: '17 18 18 18  '$ Temp: 97.8 F (36.6 C) 97.7 F (36.5 C) 97.8 F (36.6 C) 98.4 F (36.9 C)  TempSrc: Oral Oral Oral Oral  SpO2: 96% 100% 98% 98%  Weight:      Height:        Intake/Output Summary (Last 24 hours) at 02/06/17 1002 Last data filed at 02/05/17 1042  Gross per 24 hour  Intake                3 ml  Output                0 ml  Net                3 ml   Filed Weights   02/03/17 0945 02/03/17 2129  Weight: 104.3 kg (230 lb) 101.8 kg (224 lb 8 oz)    General appearance: Awake and alert. No distress Some facial flushing. No obvious swelling. No stridor. Resp: Clear to auscultation bilaterally Cardio: S1, S2 is normal, regular. No S3, S4. No rubs, murmurs or bruit. GI: Abdomen is soft. Nontender. Nondistended. Bowel sounds are  present. No masses or organomegaly Extremities: No edema Neurologic: No focal neurological deficits.  Lab Results:  Data Reviewed: I have personally reviewed following labs and imaging studies  CBC:  Recent Labs Lab 02/03/17 0947 02/05/17 0239 02/06/17 0236  WBC 7.8 6.1 6.2  HGB 13.7 12.0 12.1  HCT 44.1 38.8 38.3  MCV 90.2 89.8 90.5  PLT 227 204 563    Basic Metabolic Panel:  Recent Labs Lab 02/03/17 0947 02/04/17 0204 02/05/17 0239 02/06/17 0236  NA 141 139 139 140  K 4.4 3.5 3.7 4.2  CL 105 106 107 108  CO2 '26 26 24 27  '$ GLUCOSE 100* 102* 122* 110*  BUN 21* 18 22* 22*  CREATININE 1.09* 0.94 1.08* 0.95  CALCIUM 9.6 8.7* 8.6* 9.0    GFR: Estimated Creatinine Clearance: 64.5 mL/min (by C-G formula based on SCr of 0.95 mg/dL).  Liver Function Tests:  Recent Labs Lab 02/04/17 0204  AST 18  ALT 16  ALKPHOS 65  BILITOT 0.8  PROT 5.8*  ALBUMIN 3.2*    Coagulation Profile:  Recent Labs Lab 02/03/17 0947 02/04/17 0204 02/05/17 0239 02/06/17 0236  INR 2.06 2.51 2.60 2.10    Thyroid Function Tests:  Recent Labs  02/04/17 0204 02/05/17 0239  TSH 0.202*  --   FREET4  --  1.30*     Radiology Studies: Ct  Head Wo Contrast  Result Date: 02/05/2017 CLINICAL DATA:  Headache for 1 month. EXAM: CT HEAD WITHOUT CONTRAST TECHNIQUE: Contiguous axial images were obtained from the base of the skull through the vertex without intravenous contrast. COMPARISON:  04/09/2016 FINDINGS: Brain: No evidence of acute infarction, hemorrhage, hydrocephalus, extra-axial collection or mass lesion/mass effect. Chronic small-vessel white matter ischemic changes again noted. Vascular: Intracranial atherosclerotic calcifications again identified. Skull: Normal. Negative for fracture or focal lesion. Sinuses/Orbits: No acute finding. Other: None. IMPRESSION: No evidence of acute intracranial abnormality. Chronic small-vessel white matter ischemic changes. Electronically Signed    By: Margarette Canada M.D.   On: 02/05/2017 14:01     Medications:  Scheduled: . amiodarone  400 mg Oral TID  . cycloSPORINE  1 drop Both Eyes BID  . levothyroxine  150 mcg Oral Once per day on Sun Mon Tue Wed Thu  . [START ON 02/07/2017] levothyroxine  87.5 mcg Oral Once per day on Fri  . sodium chloride flush  3 mL Intravenous Q12H   Continuous:  BDZ:[id number] **OR** acetaminophen, ondansetron **OR** ondansetron (ZOFRAN) IV, traMADol  Assessment/Plan:  Principal Problem:   Lung mass Active Problems:   Atrial fibrillation ([place])   Long term (current) use of anticoagulants   Hypothyroidism   Superior vena cava syndrome   Abnormal chest x-ray    Superior mediastinal mass compressing SVC/SVC syndrome Patient seen by pulmonology. Plan is for the patient to undergo endobronchial ultrasound when her INR is below 1.5. Tentatively scheduled for 5/18 afternoon. Warfarin has been held. Patient does mention headaches which have been ongoing for a few months. Headache could be a result of her SVC syndrome. Patient does not have any focal neurological deficits. CT head did not show any acute findings. Her SVC syndrome appears to be between grade 1-2, which is mild-to-moderate. Does not need immediate intervention at this time. Discussed with radiation oncologist, Dr. Sondra Come. He will consult on this patient. Etiology of her mediastinal mass is not entirely clear. Differential diagnosis includes primary lung cancer, lymphoma. She has a history of breast and thyroid cancer so could have recurrence of these as well. Her thyroid cancer was managed with Dr. Forde Dandy, 18 years ago. Breasts cancer was managed by Dr. Humphrey Rolls, 8 years ago, who is no longer in this area. Will discuss with oncology.  History of paroxysmal atrial fibrillation. Followed by cardiology. Patient is on amiodarone. She did have spurts of atrial fibrillation and was given boluses of amiodarone. She was also given digoxin. Heart rate seems to  be better controlled. TSH noted to be low. Free T4 is slightly elevated. See below. Patient's INR was 2.6 yesterday. She was given a dose of vitamin K. INR has improved some. Will be repeated this afternoon. If it remains greater than 1.8, she will be given an additional dose of vitamin K.   History of hypothyroidism.  TSH is low at 0.20. Free T4 is high at 1.30. Dose of levothyroxine was reduced. Will need repeat thyroid function tests in 4-6 weeks.  Previous history of breast and thyroid cancer Stable. CT scan also showed lung nodules. The findings on the CT could be primary lung, but could also be metastatic. No mention of the appearance of the adrenal glands on CT scan report. Will need to wait on pathology once biopsy has been done.   DVT Prophylaxis: Therapeutic INR. Warfarin has been held.    Code Status: Full code  Family Communication: Discussed with the patient  Disposition Plan: Management as outlined  above    LOS: 3 days   [facility] Pager [phone] 02/06/2017, 10:02 AM  If 7PM-7AM, please contact night-coverage at [email or web address], password [facility]

## 2017-02-06 NOTE — Telephone Encounter (Signed)
Called Solar RN 2W [facility] ,patient to start Rad tx on Monday 02/10/17, not tomorrow, if patient still a patient there, will get [facility] to transport her here then, will call Monday Morning after time  Is scheduled, Solar gave verbal understaanding 4:03 PM

## 2017-02-06 NOTE — Telephone Encounter (Signed)
RN Solar returned call, asked RN to call [facility] to trasnport patient over ASAP for CT simulation for her SVC syndrome, she will only be here for 1 hour and will return back to [facility] ,  ,she will call [facility] to transport patient over today, asked if RN would call me back with time frame when this will happen Informed Bryson Ha PA of updated statsu 1:41 PM

## 2017-02-06 NOTE — Progress Notes (Signed)
Name: Kayla Harrison MRN: [id number] DOB: 07-Dec-1945    ADMISSION DATE:  02/03/2017 CONSULTATION DATE:  02/03/17  REFERRING MD :  Eulas Post  CHIEF COMPLAINT:  Facial swelling, SOB   HISTORY OF PRESENT ILLNESS:  Kayla Harrison is a 71 y.o. female with a PMH as outlined below including but not limited to breast CA (s/p lumpectomy and XRT 2010), thyroid CA (s/p thyroidectomy 1999).  She presented to [facility] ED on 5/14 with worsening facial swelling along with SOB. She states that facial swelling started some 3 weeks ago and has persisted. She initially thought that this was due to the pollen so did not think much of it; however as she had no additional allergic symptoms along with persistence and swelling and new onset of gradual SOB, she knew that something else was going on. On 5/13, she had worsening of symptoms; therefore, 5/14 she came to the ED for further evaluation.  She was found to have PAF with RVR. She was seen by EP and was started on amiodarone.  Additionally, CT of the chest demonstrated a large superior mediastinal mass contiguous with the right hilar region resulting in significant narrowing of the SVC and collateral vessels. Findings very suspicious for malignancy. Also, small pulmonary nodules, largest 70m in RUL.  Given her CT findings, PCCM was asked to see in consultation. She denies any weight loss, hemoptysis. She is a non-smoker and has never smoked in her life. She had lumpectomy and radiation in 2010 for breast cancer and also thyroidectomy 1999 for thyroid cancer. Otherwise, no additional history of malignancies.  PAST MEDICAL HISTORY :   has a past medical history of Arthritis; Atrial fibrillation ([place]; Breast cancer ([place]; Colon polyp; Complication of anesthesia; Dyslipidemia; Dysrhythmia; Hepatitis; thyroid cancer; Hyperlipidemia; Hypothyroidism; Morbid obesity ([place]; Normal cardiac stress test; Pacemaker-[employer]; Pneumonia (2010); Renal calculi; Sinus node dysfunction  (H[facility]); and Stroke ([place].  has a past surgical history that includes Thyroidectomy (1998); bso (1998); Cholecystectomy; Tonsillectomy; Colonoscopy w/ polypectomy; afib ablation; Abdominal hysterectomy (1983); Breast lumpectomy (2010); Laparoscopic gastric banding (07/10/2010); Appendectomy; Knee arthroscopy; laparotomy; Total knee arthroplasty (04/13/2012); Joint replacement (04/13/12); Cardioversion (10/09/2012); Cardioversion (10/09/2012); Cardioversion (N/A, 11/20/2012); Transabdominal ablation (03/02/13); Pacemaker insertion (03/10/13); convergent; pocket revision (N/A, 12/08/2013); Eye surgery; Cystoscopy with retrograde pyelogram, ureteroscopy and stent placement (Right, 02/06/2015); Holmium laser application (N/A, 57/82/9562; and Cystoscopy (N/A, 02/06/2015). Prior to Admission medications   Medication Sig Start Date End Date Taking? Authorizing Provider  bisoprolol (ZEBETA) 5 MG tablet Take 1/2 tablet (2.5 mg) by mouth once daily 12/25/16  Yes KDeboraha Sprang MD  cycloSPORINE (RESTASIS) 0.05 % ophthalmic emulsion Place 1 drop into both eyes 2 (two) times daily.   Yes [provider]  furosemide (LASIX) 40 MG tablet Take 40 mg by mouth daily as needed for fluid.    Yes [provider]  levothyroxine (SYNTHROID, LEVOTHROID) 175 MCG tablet Take 87.5-175 mcg by mouth daily before breakfast. TAKE 1 TABLET BY MOUTH EVERY DAY EXCEPT, TAKE 1/2 TABLET ON Friday AND NO TABLET ON Saturday   Yes [provider]  Lifitegrast (Shirley Friar 5 % SOLN Apply 1 drop to eye 2 (two) times daily.   Yes [provider]  Multiple Vitamin (MULTIVITAMIN) tablet Take 1 tablet by mouth daily.    Yes [provider]  warfarin (COUMADIN) 5 MG tablet TAKE AS DIRECTED BY  [facility] 10/07/16  Yes KDeboraha Sprang MD   Allergies  Allergen Reactions  . Tikosyn [Dofetilide]     Prolonged QT interval  Prolonged QT interval  . Xarelto [Rivaroxaban]     Nose Bleed X 6 hrs ; packing in ER  .  Epinephrine Other (See Comments) and Rash    Dental form only (liquid). Patient stated she will become "out of it" she can hear you but cannot respond in normal fashion.  Marland Kitchen Ketamine     Hallucinations  . Eliquis [Apixaban] Other (See Comments)    BLEEDING IN URINE    FAMILY HISTORY:  family history includes Cancer in her father; Diabetes in her brother, paternal aunt, paternal grandmother, and sister; Heart disease (age of onset: 69) in her father. SOCIAL HISTORY:  reports that she has never smoked. She has never used smokeless tobacco. She reports that she does not drink alcohol or use drugs.  REVIEW OF SYSTEMS:   All negative; except for those that are bolded, which indicate positives.  Constitutional: weight loss, weight gain, night sweats, fevers, chills, fatigue, weakness.  HEENT: headaches, sore throat, sneezing, nasal congestion, post nasal drip, difficulty swallowing, tooth/dental problems, visual complaints, visual changes, ear aches, facial swelling. Neuro: difficulty with speech, weakness, numbness, ataxia. CV:  chest pain, orthopnea, PND, swelling in lower extremities, dizziness, palpitations, syncope.  Resp: cough, hemoptysis, dyspnea, wheezing. GI: heartburn, indigestion, abdominal pain, nausea, vomiting, diarrhea, constipation, change in bowel habits, loss of appetite, hematemesis, melena, hematochezia.  GU: dysuria, change in color of urine, urgency or frequency, flank pain, hematuria. MSK: joint pain or swelling, decreased range of motion. Psych: change in mood or affect, depression, anxiety, suicidal ideations, homicidal ideations. Skin: rash, itching, bruising.  SUBJECTIVE:   No issues overnight. C/O slightly more dyspnea  VITAL SIGNS: Temp:  [97.7 F (36.5 C)-98.4 F (36.9 C)] 98.4 F (36.9 C) (05/17 0347) Pulse Rate:  [70-72] 71 (05/17 1025) Resp:  [18] 18 (05/17 0347) BP: (108-148)/(52-59) 148/58 (05/17 1025) SpO2:  [98 %-100 %] 98 % (05/17  0347)  PHYSICAL EXAMINATION: Blood pressure (!) 141/54, pulse 69, temperature 97.8 F (36.6 C), temperature source Oral, resp. rate 17, height '5\' 6"'$  (1.676 m), weight 224 lb 8 oz (101.8 kg), SpO2 96 %. Blood pressure (!) 148/58, pulse 71, temperature 98.4 F (36.9 C), temperature source Oral, resp. rate 18, height '5\' 6"'$  (1.676 m), weight 224 lb 8 oz (101.8 kg), SpO2 98 %. Gen:      No acute distress HEENT:  EOMI, sclera anicteric Neck:     No masses; no thyromegaly Lungs:    Clear to auscultation bilaterally; normal respiratory effort CV:         Regular rate and rhythm; no murmurs Abd:      + bowel sounds; soft, non-tender; no palpable masses, no distension Ext:    No edema; adequate peripheral perfusion Skin:      Warm and dry; no rash Neuro: alert and oriented x 3 Psych: normal mood and affect   Recent Labs Lab 02/04/17 0204 02/05/17 0239 02/06/17 0236  NA 139 139 140  K 3.5 3.7 4.2  CL 106 107 108  CO2 '26 24 27  '$ BUN 18 22* 22*  CREATININE 0.94 1.08* 0.95  GLUCOSE 102* 122* 110*    Recent Labs Lab 02/03/17 0947 02/05/17 0239 02/06/17 0236  HGB 13.7 12.0 12.1  HCT 44.1 38.8 38.3  WBC 7.8 6.1 6.2  PLT 227 204 194   Ct Head Wo Contrast  Result Date: 02/05/2017 CLINICAL DATA:  Headache for 1 month. EXAM: CT HEAD WITHOUT CONTRAST TECHNIQUE: Contiguous axial images were obtained from the base of the  skull through the vertex without intravenous contrast. COMPARISON:  04/09/2016 FINDINGS: Brain: No evidence of acute infarction, hemorrhage, hydrocephalus, extra-axial collection or mass lesion/mass effect. Chronic small-vessel white matter ischemic changes again noted. Vascular: Intracranial atherosclerotic calcifications again identified. Skull: Normal. Negative for fracture or focal lesion. Sinuses/Orbits: No acute finding. Other: None. IMPRESSION: No evidence of acute intracranial abnormality. Chronic small-vessel white matter ischemic changes. Electronically Signed   By:  Margarette Canada M.D.   On: 02/05/2017 14:01    STUDIES:  CT chest 5/14 >  large mediastinal mass extending into the right hilar region with narrowing of the SVC with narrowing of SVC and collateral vessels. Multiple small pulmonary nodules. I re reviewed all images personally.  SIGNIFICANT EVENTS  5/14 > admit.  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 Scheduled for EBUS procedure for Friday at 1 PM pending improvement in INR INR is better after Vit K 5 mg. Consider one more dose today.  Discussed with Dr. Fuller Canada MD [facility] Pager [phone] If no answer or after 3pm call: [phone] 02/06/2017, 12:44 PM

## 2017-02-06 NOTE — Telephone Encounter (Signed)
Called [facility] '@w'$  asked to speak with nurse  Taking care of [atient, spoke with Lauren who wil have RN call me back, asked that patient needs to be brought over via [facility] asap for CT simulation and treatment ,Lauren will have Rn call us 1:27 PM

## 2017-02-06 NOTE — Telephone Encounter (Signed)
Called [facility] [phone] Patient will be ready to be picked up and transported  Via [facility] back to [place]  In 30 minutes, spoke with dispatcher I believe Santiago Glad? , 4:18 PM

## 2017-02-06 NOTE — Progress Notes (Signed)
Radiation Oncology         [phone] ________________________________  Name: Kayla Harrison MRN: [id number]  Date:02/06/17  DOB: 06-28-1946  YN:WGNFA, Kayla Main, MD  No ref. provider found     REFERRING PHYSICIAN: No ref. provider found   DIAGNOSIS: The primary encounter diagnosis was Generalized weakness. Diagnoses of Near syncope, Atrial fibrillation with rapid ventricular response (HCC), Abnormal chest x-ray, Dyspnea, unspecified type, Headache, and Status post bronchoscopy with biopsy were also pertinent to this visit.   HISTORY OF PRESENT ILLNESS: Kayla Harrison is a 71 y.o. female seen at the request of Dr. 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 The patient has a history of thyroid cancer treated surgically in the 1990s, and a history of left breast cancer in 2010 treated with lumpectomy and adjuvant radiotherapy by Dr. Valere Dross. She has a history of A. Fib as well as other comorbities listed below. She presented after 3-4 weeks of increasing shortness of breath and an episode of recurrence of A. Fib after having undergone an ablation in the past few years. She was found on CT of the chest to have a large mediastinal mass measuring 5.4 x 7.1 x 6 cm resulting in narrowing of the SVC and interface with the ascending aorta, trachea, and right brachiocephalic artery. It is contiguous with the right hilar structures and encases the trachea and right mainstem bronchus. Small mediastinal notes are seen in the subcarinal region and left hilum at 1.6 and 1 cm. Multiple subcentimeter nodules are noted in the RUL, and a LLL nodule is seen at 56m. A stable liver cyst is noted. CT of the head without contrast was negative. She is seen today to discuss options of possible radiotherapy to the mediastinum and is going to have an EBUS procedure with Dr. MVaughan Brownertomorrow at [facility]   2010: radiotherapy to the left breast with Dr.  MValere Dross   PAST MEDICAL HISTORY:  Past Medical History:  Diagnosis Date  . Arthritis    osteoarthritis - knees   . Atrial fibrillation ([facility]    [facility]- 2x's-- [facility]  . [place]    Dr SMargot Chimes total thyroidectomy- 1999- for cancer  . Colon polyp    Dr MEarlean Shawl . Complication of anesthesia    Ketamine produces LSD reaction, bright colored nightmarish experience   . Dyslipidemia   . Dysrhythmia    a fib with internal/external ablation, currently NSR  . Hepatitis    [place]patitis   . Hx of thyroid cancer    Dr SForde Dandy . Hyperlipidemia   . Hypothyroidism   . Morbid obesity ([place]    Status post lap band surgery  . Normal cardiac stress test    2008- cardiac stress/echo  . Pacemaker-Medtronic   . [facility]   . Renal calculi   . [place]    Complicating convergent ablation 6/14  . [place]    [place]       PAST SURGICAL HISTORY: Past Surgical History:  Procedure Laterality Date  . ABDOMINAL HYSTERECTOMY  1983  . afib ablation     X 2; [facility] & Dr KCaryl Comes . APPENDECTOMY    . BREAST LUMPECTOMY  2010  . bso  1998  . CARDIOVERSION  10/09/2012   Procedure: CARDIOVERSION;  Surgeon: Minus Breeding, MD;  Location: [place];  Service: Cardiovascular;  Laterality: N/A;  . CARDIOVERSION  10/09/2012   Procedure: CARDIOVERSION;  Surgeon: Minus Breeding, MD;  Location: [facility] ENDOSCOPY;  Service: Cardiovascular;  Laterality: N/A;  [name] gave the ok to add pt to the add on , but we must check to find out if the can add pt on at [phone] ( [phone])  . CARDIOVERSION N/A 11/20/2012   Procedure: CARDIOVERSION;  Surgeon: Fay Records, MD;  Location: [facility] ENDOSCOPY;  Service: Cardiovascular;  Laterality: N/A;  . CHOLECYSTECTOMY    . COLONOSCOPY W/ POLYPECTOMY     Dr Earlean Shawl  . convergent    .  CYSTOSCOPY N/A 02/06/2015   Procedure: CYSTOSCOPY;  Surgeon: Kathie Rhodes, MD;  Location: [facility];  Service: Urology;  Laterality: N/A;  . CYSTOSCOPY WITH RETROGRADE PYELOGRAM, URETEROSCOPY AND STENT PLACEMENT Right 02/06/2015   Procedure: RETROGRADE PYELOGRAM, RIGHT URETEROSCOPY STENT PLACEMENT;  Surgeon: Kathie Rhodes, MD;  Location: [facility];  Service: Urology;  Laterality: Right;  . EYE SURGERY     cataract surgery  . HOLMIUM LASER APPLICATION N/A 5/32/9924   Procedure: HOLMIUM LASER APPLICATION;  Surgeon: Kathie Rhodes, MD;  Location: [facility];  Service: Urology;  Laterality: N/A;  . JOINT REPLACEMENT  04/13/12   Right Knee replacement  . KNEE ARTHROSCOPY     bilateral  . LAPAROSCOPIC GASTRIC BANDING  07/10/2010  . LAPAROTOMY     for ruptured ovary and ovarian artery   . PACEMAKER INSERTION  03/10/13   [facility]  . POCKET REVISION N/A 12/08/2013   Procedure: POCKET REVISION;  Surgeon: Deboraha Sprang, MD;  Location: [facility] C[facility] LAB;  Service: Cardiovascular;  Laterality: N/A;  . THYROIDECTOMY  1998   Dr Margot Chimes  . TONSILLECTOMY    . TOTAL KNEE ARTHROPLASTY  04/13/2012   Procedure: TOTAL KNEE ARTHROPLASTY;  Surgeon: Rudean Haskell, MD;  Location: [place];  Service: Orthopedics;  Laterality: Right;  . Transabdominal ablation  03/02/13   [facility]     FAMILY HISTORY:  Family History  Problem Relation Age of Onset  . Cancer Father        COLON  . Heart disease Father 74       MI '@autopsy'$   . Diabetes Sister   . Diabetes Brother   . Diabetes Paternal Aunt   . Diabetes Paternal Grandmother      SOCIAL HISTORY:  reports that she has never smoked. She has never used smokeless tobacco. She reports that she does not drink alcohol or use drugs. The patient is retired. 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    ALLERGIES: Tikosyn [dofetilide]; Xarelto [rivaroxaban]; Epinephrine; Ketamine; and Eliquis [apixaban]   MEDICATIONS:  Current Facility-Administered Medications  Medication  Dose Route Frequency Provider Last Rate Last Dose  . acetaminophen (TYLENOL) tablet 650 mg  650 mg Oral Q6H PRN Lily Kocher, MD   650 mg at 02/04/17 2129   Or  . acetaminophen (TYLENOL) suppository 650 mg  650 mg Rectal Q6H PRN Lily Kocher, MD      . amiodarone (PACERONE) tablet 400 mg  400 mg Oral TID [name], PA-C   400 mg at 02/06/17 1023  . cycloSPORINE (RESTASIS) 0.05 % ophthalmic emulsion 1 drop  1 drop Both Eyes BID Lily Kocher, MD   1 drop at 02/05/17 2308  . levothyroxine (SYNTHROID, LEVOTHROID) tablet 150 mcg  150 mcg Oral Once per day on Sun Mon Tue  Wed Karena Addison, Nicki [place], MD   150 mcg at 02/06/17 0720  . [START ON 02/07/2017] levothyroxine (SYNTHROID, LEVOTHROID) tablet 87.5 mcg  87.5 mcg Oral Once per day on Fri Carter, Lexine Baton, MD      . ondansetron [facility]) tablet 4 mg  4 mg Oral Q6H PRN Lily Kocher, MD       Or  . ondansetron [facility]) injection 4 mg  4 mg Intravenous Q6H PRN Lily Kocher, MD      . sodium chloride flush (NS) 0.9 % injection 3 mL  3 mL Intravenous Q12H Lily Kocher, MD   3 mL at 02/05/17 1042  . traMADol (ULTRAM) tablet 50 mg  50 mg Oral Q6H PRN Bonnielee Haff, MD         REVIEW OF SYSTEMS: On review of systems, the patient reports that in the last few weeks her breathing has worsened and she has noticed increasing fullness in her face and rigt arm. She  denies any chest pain, cough, fevers, chills, night sweats, unintended weight changes. She has been noticing progressive fatigue for the last year. She has shortness of breath with minimal activity and at rest. She also has orthopnea and has noticed this for the last 3-4 weeks. She denies any bowel or bladder disturbances, and denies abdominal pain, nausea or vomiting. She continues to have right scapular pain and reports this for the last year, though denies any new musculoskeletal or joint aches or pains. A complete review of systems is obtained and is otherwise negative.     PHYSICAL EXAM:   Wt Readings from Last 3 Encounters:  02/03/17 224 lb 8 oz (101.8 kg)  12/25/16 234 lb (106.1 kg)  01/16/16 221 lb 6.4 oz (100.4 kg)   Temp Readings from Last 3 Encounters:  02/06/17 98.4 F (36.9 C) (Oral)  10/26/15 98 F (36.7 C) (Oral)  10/22/15 97.5 F (36.4 C) (Oral)   BP Readings from Last 3 Encounters:  02/06/17 (!) 148/58  12/25/16 120/86  01/16/16 (!) 144/88   Pulse Readings from Last 3 Encounters:  02/06/17 71  12/25/16 77  02/22/16 77   Pain Assessment Pain Score: 0-No pain/10  In general this is a well appearing caucasian female in no acute distress. She is alert and oriented x4 and appropriate throughout the examination. HEENT reveals that the patient is normocephalic, atraumatic with facial fullness and more fullness of the right neck. She has more prominent capillary beds along her cheeks bilaterally. EOMs are intact. PERRLA. Skin is intact without any evidence of gross lesions. She has prominent collateral veins along the chest wall. Cardiovascular exam reveals an  irregular rate and rhythm, no clicks rubs or murmurs are auscultated. Chest is clear to auscultation bilaterally. Lymphatic assessment is performed and does not reveal any adenopathy in the cervical, supraclavicular, axillary, or inguinal chains. She has trace edema of bilateral upper extremities from the dorsal aspects of her hands to her mid upper arms. Abdomen has active bowel sounds in all quadrants and is intact. The abdomen is soft, non tender, non distended. Lower extremities are negative for pretibial pitting edema, deep calf tenderness, cyanosis or clubbing.   ECOG = 2  0 - Asymptomatic (Fully active, able to carry on all predisease activities without restriction)  1 - Symptomatic but completely ambulatory (Restricted in physically strenuous activity but ambulatory and able to carry out work of a light or sedentary nature. For example, light housework, office work)  2 - Symptomatic, <50% in bed  during the  day (Ambulatory and capable of all self care but unable to carry out any work activities. Up and about more than 50% of waking hours)  3 - Symptomatic, >50% in bed, but not bedbound (Capable of only limited self-care, confined to bed or chair 50% or more of waking hours)  4 - Bedbound (Completely disabled. Cannot carry on any self-care. Totally confined to bed or chair)  5 - Death   Eustace Pen MM, Creech RH, Tormey DC, et al. [phone]). "Toxicity and response criteria of the [facility]". Lydia Oncol. 5 (6): 649-55    LABORATORY DATA:  Lab Results  Component Value Date   WBC 6.2 02/06/2017   HGB 12.1 02/06/2017   HCT 38.3 02/06/2017   MCV 90.5 02/06/2017   PLT 194 02/06/2017   Lab Results  Component Value Date   NA 140 02/06/2017   K 4.2 02/06/2017   CL 108 02/06/2017   CO2 27 02/06/2017   Lab Results  Component Value Date   ALT 16 02/04/2017   AST 18 02/04/2017   ALKPHOS 65 02/04/2017   BILITOT 0.8 02/04/2017      RADIOGRAPHY: Dg Chest 2 View  Result Date: 02/03/2017 CLINICAL DATA:  71 year old female with increased shortness breath over the past 5-6 days. Thyroid cancer. Breast cancer. Initial encounter. EXAM: CHEST  2 VIEW COMPARISON:  06/30/2015 chest x-ray FINDINGS: Abnormality right paratracheal region suspicious for mass. Vascular abnormality secondary consideration. Chest CT recommended for further evaluation. Post thyroidectomy. Prior clipping left atrium. Sequential pacemaker in place. Heart size within normal limits. No infiltrate, congestive heart failure or pneumothorax. Erosion distal right clavicle possibly degenerative in origin. Calcified aorta. IMPRESSION: Abnormality right paratracheal region suspicious for mass. Vascular abnormality secondary consideration. Chest CT recommended for further evaluation. Post thyroidectomy and breast surgery. Prior clipping left atrium. Sequential pacemaker in place. Heart size within normal  limits. No infiltrate or congestive heart failure. Erosion distal right clavicle possibly degenerative in origin. These results were called by telephone at the time of interpretation on 02/03/2017 at 10:11 am to Dr. Lajean Saver , who verbally acknowledged these results. Electronically Signed   By: Genia Del M.D.   On: 02/03/2017 10:15   Ct Head Wo Contrast  Result Date: 02/05/2017 CLINICAL DATA:  Headache for 1 month. EXAM: CT HEAD WITHOUT CONTRAST TECHNIQUE: Contiguous axial images were obtained from the base of the skull through the vertex without intravenous contrast. COMPARISON:  04/09/2016 FINDINGS: Brain: No evidence of acute infarction, hemorrhage, hydrocephalus, extra-axial collection or mass lesion/mass effect. Chronic small-vessel white matter ischemic changes again noted. Vascular: Intracranial atherosclerotic calcifications again identified. Skull: Normal. Negative for fracture or focal lesion. Sinuses/Orbits: No acute finding. Other: None. IMPRESSION: No evidence of acute intracranial abnormality. Chronic small-vessel white matter ischemic changes. Electronically Signed   By: Margarette Canada M.D.   On: 02/05/2017 14:01   Ct Chest W Contrast  Result Date: 02/03/2017 CLINICAL DATA:  Pt c/o difficulty breathing dizziness syncope, Afib off and on but on almost all day today. History of thyroid cancer and breast cancer. EXAM: CT CHEST WITH CONTRAST TECHNIQUE: Multidetector CT imaging of the chest was performed during intravenous contrast administration. CONTRAST:  57m ISOVUE-300 IOPAMIDOL (ISOVUE-300) INJECTION 61% COMPARISON:  02/03/2017 and CT of the chest on 04/21/2013 FINDINGS: Cardiovascular: There is extensive coronary artery calcification. Left atrial appendage clip is in place. Mitral annulus calcification. The heart is mildly enlarged. No pericardial effusion. Bovine arch anatomy. There is narrowing of the superior vena cava  secondary to mediastinal mass described below. There is filling  and numerous collateral vessels. Left upper extremity contrast injection. Mediastinum/Nodes: Superior mediastinal mass is 5.4 x 7.1 x 6.0 cm. Mass results in significant narrowing of the superior vena cava and has a long interface with the ascending aorta, trachea, superior vena cava, and right brachiocephalic artery. Mass is contiguous with the right hilar structures and encases the trachea and right mainstem bronchus. Small mediastinal lymph nodes are identified in the subcarinal region (1.6 cm) left hilar region (1.0 cm). Lungs/Pleura: Multiple small pulmonary nodules are present. In the right upper lobe, a 6 mm nodule is identified on image 39 of series 7. In the right upper lobe a 4 mm nodule is identified on image 50 of series 7. In the left lower lobe, a 5 mm nodule is identified on image 90 of series 7. There are no pleural effusions or focal consolidations. Upper Abdomen: Multiple calcified granulomata are identified within the spleen. Patient has a lap band in place around the proximal stomach. A small low-attenuation lesion is identified in the left hepatic lobe measuring 9 mm on image 117 of series 3, stable in appearance and consistent with cyst. Small calcified hepatic granulomata are present. The adrenal glands are normal in appearance. Bilateral intrarenal calculi. Musculoskeletal: Degenerative changes are seen in the thoracic spine. No suspicious lytic or blastic lesions are identified. IMPRESSION: 1. Large superior mediastinal mass contiguous with the right hilar region resulting in significant narrowing of the superior vena cava and collateral vessels filling. Findings are suspicious for malignancy, either primary secondary. 2. Small pulmonary nodules as described, largest measuring 6 mm in the right upper lobe. 3. Coronary artery disease. Mitral annulus calcification. Cardiomegaly. 4. Bovine arch anatomy. 5. Prior granulomatous disease. Numerous calcified granulomata within the liver and spleen.  6. Hepatic cyst. Electronically Signed   By: Nolon Nations M.D.   On: 02/03/2017 17:30       IMPRESSION/PLAN: 1. Mediastinal mass concerning for malignancy with radiographic findings concerning for SVC syndrome. The patient meets with Dr. Lisbeth Renshaw to review the findings from her imaging studies. She will undergo tissue biopsy tomorrow with Dr. Vaughan Browner. We discussed the likelihood is high that this finding represents malignancy. We would like to confirm this is the case if possible prior to treatment. We discussed that once this is confirmed, we can proceed with radiotherapy, however if her status was to acutely change, we could move forward more urgently. She is stable on room air saturating in the low 90 percentile. Dr. Lisbeth Renshaw discussed the role of radiotherapy and would offer her a course of 30 Gy in 10 fractions to the mediastinum. We discussed the risks, benefits, short, and long term effects of radiotherapy, and the patient is interested in proceeding. Dr. Lisbeth Renshaw discusses the delivery and logistics of radiotherapy. She has signed written consent, and a copy is provided to her. We will proceed today with simulation and anticipate treatment to begin on Monday unless clinical changes indicate a more urgent approach.   The above documentation reflects my direct findings during this shared patient visit. Please see the separate note by Dr. Lisbeth Renshaw on this date for the remainder of the patient's plan of care.    Carola Rhine, PAC

## 2017-02-06 NOTE — Progress Notes (Addendum)
[place] for heparin Indication: atrial fibrillation  Heparin Dosing Weight: 83.2 kg   Assessment: 1 yof with hx of afib on warfarin PTA (EP notes state intolerant of DOACs). Pt with superior mediastinal mass (possible lymphoma vs cancer) and will need biopsy per MD notes. Pharmacy consulted to start heparin (Warfarin on hold). Amiodarone load started 5/14 and vitamin K '5mg'$   po given on 5/16 -INR= 2.1 (last dose taken 5/14)  Goal of Therapy:  Heparin level 0.3-0.7 units/ml Monitor platelets by anticoagulation protocol: Yes   Plan:  Heparin when INR<2 Anticipate continued effect of vitamin K Daily INR Warfarin on hold - f/u plans for biopsy (possibly on Friday)  Hildred Laser, Pharm D 02/06/2017 8:14 AM

## 2017-02-07 ENCOUNTER — Encounter (HOSPITAL_COMMUNITY)
Admission: EM | Disposition: A | Discharge status: Home / Self Care | Payer: Self-pay | Source: Home / Self Care | Attending: Internal Medicine

## 2017-02-07 ENCOUNTER — Inpatient Hospital Stay (HOSPITAL_COMMUNITY): Payer: Medicare Other | Admitting: Anesthesiology

## 2017-02-07 ENCOUNTER — Ambulatory Visit: Payer: Medicare Other

## 2017-02-07 ENCOUNTER — Inpatient Hospital Stay (HOSPITAL_COMMUNITY): Payer: Medicare Other

## 2017-02-07 ENCOUNTER — Ambulatory Visit: Payer: Medicare Other | Admitting: Radiation Oncology

## 2017-02-07 HISTORY — PX: VIDEO BRONCHOSCOPY WITH ENDOBRONCHIAL ULTRASOUND: SHX6177

## 2017-02-07 LAB — CBC
HCT: 37.4 % (ref 36.0–46.0)
Hemoglobin: 11.8 g/dL — ABNORMAL LOW (ref 12.0–15.0)
MCH: 28.4 pg (ref 26.0–34.0)
MCHC: 31.6 g/dL (ref 30.0–36.0)
MCV: 89.9 fL (ref 78.0–100.0)
PLATELETS: 190 10*3/uL (ref 150–400)
RBC: 4.16 MIL/uL (ref 3.87–5.11)
RDW: 14.4 % (ref 11.5–15.5)
WBC: 6 10*3/uL (ref 4.0–10.5)

## 2017-02-07 LAB — BASIC METABOLIC PANEL
Anion gap: 9 (ref 5–15)
BUN: 18 mg/dL (ref 6–20)
CO2: 25 mmol/L (ref 22–32)
CREATININE: 0.95 mg/dL (ref 0.44–1.00)
Calcium: 8.8 mg/dL — ABNORMAL LOW (ref 8.9–10.3)
Chloride: 106 mmol/L (ref 101–111)
GFR calc Af Amer: >60 mL/min (ref >60)
GFR, EST NON AFRICAN AMERICAN: 58 mL/min — AB (ref >60)
Glucose, Bld: 100 mg/dL — ABNORMAL HIGH (ref 65–99)
POTASSIUM: 4.1 mmol/L (ref 3.5–5.1)
SODIUM: 140 mmol/L (ref 135–145)

## 2017-02-07 LAB — APTT: APTT: 40 s — AB (ref 24–36)

## 2017-02-07 LAB — PROTIME-INR
INR: 1.39
Prothrombin Time: 17.1 seconds — ABNORMAL HIGH (ref 11.4–15.2)

## 2017-02-07 SURGERY — BRONCHOSCOPY, WITH EBUS
Anesthesia: General

## 2017-02-07 MED ORDER — FENTANYL CITRATE (PF) 250 MCG/5ML IJ SOLN
INTRAMUSCULAR | Status: AC
  Filled 2017-02-07: qty 5

## 2017-02-07 MED ORDER — LIDOCAINE HCL (CARDIAC) 20 MG/ML IV SOLN
INTRAVENOUS | Status: DC | PRN
  Administered 2017-02-07: 40 mg via INTRAVENOUS

## 2017-02-07 MED ORDER — ROCURONIUM BROMIDE 100 MG/10ML IV SOLN
INTRAVENOUS | Status: DC | PRN
  Administered 2017-02-07: 50 mg via INTRAVENOUS

## 2017-02-07 MED ORDER — PROPOFOL 10 MG/ML IV BOLUS
INTRAVENOUS | Status: DC | PRN
  Administered 2017-02-07: 120 mg via INTRAVENOUS

## 2017-02-07 MED ORDER — PHENYLEPHRINE HCL 10 MG/ML IJ SOLN
INTRAVENOUS | Status: DC | PRN
  Administered 2017-02-07: 25 ug/min via INTRAVENOUS

## 2017-02-07 MED ORDER — OXYCODONE HCL 5 MG PO TABS: 5.0000 mg | ORAL_TABLET | Freq: Once | ORAL | Status: DC | PRN

## 2017-02-07 MED ORDER — MIDAZOLAM HCL 2 MG/2ML IJ SOLN
INTRAMUSCULAR | Status: AC
  Filled 2017-02-07: qty 2

## 2017-02-07 MED ORDER — FENTANYL CITRATE (PF) 100 MCG/2ML IJ SOLN
INTRAMUSCULAR | Status: AC
  Filled 2017-02-07: qty 2

## 2017-02-07 MED ORDER — 0.9 % SODIUM CHLORIDE (POUR BTL) OPTIME
TOPICAL | Status: DC | PRN
  Administered 2017-02-07: 1000 mL

## 2017-02-07 MED ORDER — OXYCODONE HCL 5 MG/5ML PO SOLN: 5.0000 mg | Freq: Once | ORAL | Status: DC | PRN

## 2017-02-07 MED ORDER — FENTANYL CITRATE (PF) 100 MCG/2ML IJ SOLN
25.0000 ug | INTRAMUSCULAR | Status: DC | PRN
  Administered 2017-02-07: 50 ug via INTRAVENOUS

## 2017-02-07 MED ORDER — FENTANYL CITRATE (PF) 100 MCG/2ML IJ SOLN
INTRAMUSCULAR | Status: DC | PRN
  Administered 2017-02-07 (×2): 50 ug via INTRAVENOUS

## 2017-02-07 MED ORDER — SUGAMMADEX SODIUM 200 MG/2ML IV SOLN
INTRAVENOUS | Status: DC | PRN
  Administered 2017-02-07: 203.6 mg via INTRAVENOUS

## 2017-02-07 MED ORDER — ONDANSETRON HCL 4 MG/2ML IJ SOLN
INTRAMUSCULAR | Status: DC | PRN
Start: 2017-02-07 — End: 2017-02-07
  Administered 2017-02-07: 4 mg via INTRAVENOUS

## 2017-02-07 MED ORDER — LACTATED RINGERS IV SOLN
INTRAVENOUS | Status: DC
  Administered 2017-02-07 (×2): via INTRAVENOUS

## 2017-02-07 MED ORDER — MIDAZOLAM HCL 5 MG/5ML IJ SOLN
INTRAMUSCULAR | Status: DC | PRN
Start: 2017-02-07 — End: 2017-02-07
  Administered 2017-02-07 (×2): 1 mg via INTRAVENOUS

## 2017-02-07 MED ORDER — PROPOFOL 10 MG/ML IV BOLUS
INTRAVENOUS | Status: AC
  Filled 2017-02-07: qty 20

## 2017-02-07 MED ORDER — FENTANYL CITRATE (PF) 100 MCG/2ML IJ SOLN: 25.0000 ug | INTRAMUSCULAR | Status: DC | PRN

## 2017-02-07 MED ORDER — ONDANSETRON HCL 4 MG/2ML IJ SOLN: 4.0000 mg | Freq: Once | INTRAMUSCULAR | Status: DC | PRN

## 2017-02-07 SURGICAL SUPPLY — 29 items
BRUSH CYTOL CELLEBRITY 1.5X140 (MISCELLANEOUS) IMPLANT
CANISTER SUCT 3000ML PPV (MISCELLANEOUS) ×3 IMPLANT
CONT SPEC 4OZ CLIKSEAL STRL BL (MISCELLANEOUS) ×6 IMPLANT
COVER BACK TABLE 60X90IN (DRAPES) ×3 IMPLANT
COVER DOME SNAP 22 D (MISCELLANEOUS) ×3 IMPLANT
FILTER STRAW FLUID ASPIR (MISCELLANEOUS) IMPLANT
FORCEPS BIOP RJ4 1.8 (CUTTING FORCEPS) IMPLANT
GAUZE SPONGE 4X4 12PLY STRL (GAUZE/BANDAGES/DRESSINGS) ×3 IMPLANT
GLOVE BIO SURGEON STRL SZ7.5 (GLOVE) ×3 IMPLANT
GLOVE BIOGEL PI IND STRL 6 (GLOVE) ×2 IMPLANT
GLOVE BIOGEL PI INDICATOR 6 (GLOVE) ×4
GOWN STRL REUS W/ TWL LRG LVL3 (GOWN DISPOSABLE) ×2 IMPLANT
GOWN STRL REUS W/TWL LRG LVL3 (GOWN DISPOSABLE) ×4
KIT CLEAN ENDO COMPLIANCE (KITS) ×6 IMPLANT
KIT ROOM TURNOVER OR (KITS) ×3 IMPLANT
MARKER SKIN DUAL TIP RULER LAB (MISCELLANEOUS) ×3 IMPLANT
NEEDLE SONO TIP II EBUS (NEEDLE) ×3 IMPLANT
NS IRRIG 1000ML POUR BTL (IV SOLUTION) ×3 IMPLANT
OIL SILICONE PENTAX (PARTS (SERVICE/REPAIRS)) ×3 IMPLANT
PAD ARMBOARD 7.5X6 YLW CONV (MISCELLANEOUS) ×6 IMPLANT
SYR 20CC LL (SYRINGE) IMPLANT
SYR 20ML ECCENTRIC (SYRINGE) ×9 IMPLANT
SYR 3ML LL SCALE MARK (SYRINGE) IMPLANT
SYR 5ML LUER SLIP (SYRINGE) ×3 IMPLANT
TOWEL OR 17X24 6PK STRL BLUE (TOWEL DISPOSABLE) ×3 IMPLANT
TRAP SPECIMEN MUCOUS 40CC (MISCELLANEOUS) IMPLANT
TUBE CONNECTING 20'X1/4 (TUBING) ×1
TUBE CONNECTING 20X1/4 (TUBING) ×2 IMPLANT
WATER STERILE IRR 1000ML POUR (IV SOLUTION) ×3 IMPLANT

## 2017-02-07 NOTE — Progress Notes (Signed)
[facility] PROGRESS NOTE  Kayla Harrison NMM:[id number] DOB: 10-14-1945 DOA: 02/03/2017  PCP: [name], MD  Brief History/Interval Summary: 71 year old Caucasian female with a past medical history breast and thyroid cancer, prior history of CVA, history of paroxysmal atrial fibrillation, anticoagulated with warfarin, history of pacemaker placement, presented with complaints of shortness of breath, dizziness and facial swelling. Evaluation revealed a mediastinal mass causing compression of the superior vena cava. Patient was hospitalized for further management.  Reason for Visit: Mediastinal mass with SVC syndrome  Consultants: Cardiology. Pulmonology.  Procedures: Endobronchial ultrasound and biopsy scheduled for today  Antibiotics: None  Subjective/Interval History: Patient feels well. Continues to have headache. Occasional shortness of breath. No chest pain.   ROS: No nausea or vomiting  Objective:  Vital Signs  Vitals:   02/06/17 2031 02/06/17 2157 02/07/17 0455 02/07/17 0938  BP:  122/76 125/80   Pulse: 70  69   Resp: '18  18 18  '$ Temp: 98 F (36.7 C)  97.8 F (36.6 C) 97.6 F (36.4 C)  TempSrc: Oral  Oral Oral  SpO2:   98% 100%  Weight:      Height:        Intake/Output Summary (Last 24 hours) at 02/07/17 1033 Last data filed at 02/06/17 1200  Gross per 24 hour  Intake              240 ml  Output                0 ml  Net              240 ml   Filed Weights   02/03/17 0945 02/03/17 2129  Weight: 104.3 kg (230 lb) 101.8 kg (224 lb 8 oz)    General appearance: Awake and alert. No distress Some facial flushing. No obvious swelling. Resp: Clear to auscultation bilaterally Cardio: S1, S2 is normal, regular. No S3, S4. No rubs, murmurs, or bruit GI: Abdomen is soft. Nontender, nondistended. Bowel sounds present. No masses or organomegaly Extremities: No edema Neurologic: No focal deficits  Lab Results:  Data Reviewed: I have personally  reviewed following labs and imaging studies  CBC:  Recent Labs Lab 02/03/17 0947 02/05/17 0239 02/06/17 0236 02/07/17 0317  WBC 7.8 6.1 6.2 6.0  HGB 13.7 12.0 12.1 11.8*  HCT 44.1 38.8 38.3 37.4  MCV 90.2 89.8 90.5 89.9  PLT 227 204 194 315    Basic Metabolic Panel:  Recent Labs Lab 02/03/17 0947 02/04/17 0204 02/05/17 0239 02/06/17 0236 02/07/17 0317  NA 141 139 139 140 140  K 4.4 3.5 3.7 4.2 4.1  CL 105 106 107 108 106  CO2 '26 26 24 27 25  '$ GLUCOSE 100* 102* 122* 110* 100*  BUN 21* 18 22* 22* 18  CREATININE 1.09* 0.94 1.08* 0.95 0.95  CALCIUM 9.6 8.7* 8.6* 9.0 8.8*    GFR: Estimated Creatinine Clearance: 64.5 mL/min (by C-G formula based on SCr of 0.95 mg/dL).  Liver Function Tests:  Recent Labs Lab 02/04/17 0204  AST 18  ALT 16  ALKPHOS 65  BILITOT 0.8  PROT 5.8*  ALBUMIN 3.2*    Coagulation Profile:  Recent Labs Lab 02/04/17 0204 02/05/17 0239 02/06/17 0236 02/06/17 1312 02/07/17 0317  INR 2.51 2.60 2.10 1.56 1.39    Thyroid Function Tests:  Recent Labs  02/05/17 0239  FREET4 1.30*     Radiology Studies: Ct Head Wo Contrast  Result Date: 02/05/2017 CLINICAL DATA:  Headache for 1 month. EXAM:  CT HEAD WITHOUT CONTRAST TECHNIQUE: Contiguous axial images were obtained from the base of the skull through the vertex without intravenous contrast. COMPARISON:  04/09/2016 FINDINGS: Brain: No evidence of acute infarction, hemorrhage, hydrocephalus, extra-axial collection or mass lesion/mass effect. Chronic small-vessel white matter ischemic changes again noted. Vascular: Intracranial atherosclerotic calcifications again identified. Skull: Normal. Negative for fracture or focal lesion. Sinuses/Orbits: No acute finding. Other: None. IMPRESSION: No evidence of acute intracranial abnormality. Chronic small-vessel white matter ischemic changes. Electronically Signed   By: Margarette Canada M.D.   On: 02/05/2017 14:01     Medications:  Scheduled: .  amiodarone  400 mg Oral TID  . cycloSPORINE  1 drop Both Eyes BID  . levothyroxine  150 mcg Oral Once per day on Sun Mon Tue Wed Thu  . levothyroxine  87.5 mcg Oral Once per day on Fri  . sodium chloride flush  3 mL Intravenous Q12H   Continuous:  OVF:IEPPIRJJOACZY **OR** acetaminophen, ondansetron **OR** ondansetron (ZOFRAN) IV, traMADol  Assessment/Plan:  Principal Problem:   Lung mass Active Problems:   Atrial fibrillation ([place])   Long term (current) use of anticoagulants   Hypothyroidism   Superior vena cava syndrome   Abnormal chest x-ray    Superior mediastinal mass compressing SVC/SVC syndrome Plan is for the patient to undergo endobronchial ultrasound when her INR is below 1.5. INR is 1.3 today. She had to be given vitamin K. Procedure is scheduled for this afternoon. Warfarin has been held. She appears to have mild symptoms due to her SVC syndrome including headache, dyspnea and facial swelling, both of which are stable. Facial swelling has resolved. Occasional dyspnea. Not hypoxic. Appreciate radiation oncology input. She has been scheduled to begin radiation treatment on Monday. Also discussed with oncology, Dr. Lindi Adie. He has scheduled an outpatient appointment for the patient. If patient remains stable post procedure and overnight, she should be able to go home tomorrow and then return for radiation treatment on Monday. Patient does not have any focal neurological deficits. CT head did not show any acute findings. Her SVC syndrome appears to be between grade 1-2, which is mild-to-moderate. Etiology of her mediastinal mass is not entirely clear. Differential diagnosis includes primary lung cancer, lymphoma. She has a history of breast and thyroid cancer so could have recurrence of these as well. Her thyroid cancer was managed with Dr. Forde Dandy, 18 years ago. Breasts cancer was managed by Dr. Humphrey Rolls, 8 years ago, who is no longer in this area.  History of paroxysmal atrial  fibrillation. Followed by cardiology. Patient is on amiodarone. She did have spurts of atrial fibrillation and was given boluses of amiodarone. She was also given digoxin. Heart rate seems to be better controlled. TSH noted to be low. Free T4 is slightly elevated. See below. Discussed with cardiology today. They recommend bridging with Lovenox when discharged home.    History of hypothyroidism.  TSH is low at 0.20. Free T4 is high at 1.30. Dose of levothyroxine was reduced. Will need repeat thyroid function tests in 4-6 weeks.  Previous history of breast and thyroid cancer Stable. CT scan also showed lung nodules. The findings on the CT could be primary lung, but could also be metastatic. No mention of the appearance of the adrenal glands on CT scan report. Will need to wait on pathology once biopsy has been done. She has been made an appointment with oncology next week.   DVT Prophylaxis: Therapeutic INR. Warfarin has been held.    Code Status: Full code  Family Communication: Discussed with the patient  Disposition Plan: Management as outlined above    LOS: 4 days   [facility] Pager [phone] 02/07/2017, 10:33 AM  If 7PM-7AM, please contact night-coverage at [email or web address], password [facility]

## 2017-02-07 NOTE — Progress Notes (Signed)
Preliminary pathology is suspicious for lymphomatous cancer. I called the patient to let her know we will await final pathology but that Dr. Lisbeth Renshaw feels that we can move forward with treatment on 02/10/17.

## 2017-02-07 NOTE — Progress Notes (Signed)
Events and plans noted. Rhythm stable. No new recs.   Electrophysiology team to see as needed while here. Please call with questions.  Chanetta Marshall, NP 02/07/2017 11:19 AM

## 2017-02-07 NOTE — Anesthesia Procedure Notes (Signed)
Procedure Name: Intubation Date/Time: 02/07/2017 1:43 PM Performed by: Clearnce Sorrel Pre-anesthesia Checklist: Patient identified, Emergency Drugs available, Suction available, Patient being monitored and Timeout performed Patient Re-evaluated:Patient Re-evaluated prior to inductionOxygen Delivery Method: Circle system utilized Preoxygenation: Pre-oxygenation with 100% oxygen Intubation Type: IV induction Ventilation: Mask ventilation without difficulty and Oral airway inserted - appropriate to patient size Laryngoscope Size: Miller and 2 Grade View: Grade I Tube type: Oral Tube size: 8.5 mm Number of attempts: 1 Airway Equipment and Method: Stylet Placement Confirmation: ETT inserted through vocal cords under direct vision,  positive ETCO2 and breath sounds checked- equal and bilateral Secured at: 22 cm Tube secured with: Tape Dental Injury: Teeth and Oropharynx as per pre-operative assessment

## 2017-02-07 NOTE — Anesthesia Preprocedure Evaluation (Addendum)
Anesthesia Evaluation  Patient identified by MRN, date of birth, ID band Patient awake    Reviewed: Allergy & Precautions, NPO status , Patient's Chart, lab work & pertinent test results  Airway Mallampati: II  TM Distance: >3 FB Neck ROM: Full    Dental  (+) Teeth Intact, Dental Advisory Given   Pulmonary    breath sounds clear to auscultation       Cardiovascular  Rhythm:Regular Rate:Normal     Neuro/Psych    GI/Hepatic   Endo/Other    Renal/GU      Musculoskeletal   Abdominal   Peds  Hematology   Anesthesia Other Findings   Reproductive/Obstetrics                             Anesthesia Physical Anesthesia Plan  ASA: III  Anesthesia Plan: General   Post-op Pain Management:    Induction: Intravenous  Airway Management Planned: Oral ETT  Additional Equipment:   Intra-op Plan:   Post-operative Plan: Extubation in OR  Informed Consent: I have reviewed the patients History and Physical, chart, labs and discussed the procedure including the risks, benefits and alternatives for the proposed anesthesia with the patient or authorized representative who has indicated his/her understanding and acceptance.   Dental advisory given  Plan Discussed with: CRNA and Anesthesiologist  Anesthesia Plan Comments:        Anesthesia Quick Evaluation

## 2017-02-07 NOTE — Anesthesia Postprocedure Evaluation (Addendum)
Anesthesia Post Note  Patient: Kayla Harrison  Procedure(s) Performed: Procedure(s) (LRB): VIDEO BRONCHOSCOPY WITH ENDOBRONCHIAL ULTRASOUND (N/A)  Patient location during evaluation: PACU Anesthesia Type: General Level of consciousness: awake, awake and alert and oriented Pain management: pain level controlled Vital Signs Assessment: post-procedure vital signs reviewed and stable Respiratory status: spontaneous breathing, nonlabored ventilation and respiratory function stable Cardiovascular status: blood pressure returned to baseline Anesthetic complications: no       Last Vitals:  Vitals:   02/07/17 1600 02/07/17 1626  BP: 104/67 (!) 122/53  Pulse: 60 64  Resp: 12 16  Temp: 36.4 C 36.4 C    Last Pain:  Vitals:   02/07/17 1943  TempSrc:   PainSc: 0-No pain                 Quatisha Zylka COKER

## 2017-02-07 NOTE — Progress Notes (Signed)
Name: Kayla Harrison MRN: [id number] DOB: [birth year]    ADMISSION DATE:  02/03/2017 CONSULTATION DATE:  02/03/17  REFERRING MD :  Eulas Post  CHIEF COMPLAINT:  Facial swelling, SOB   HISTORY OF PRESENT ILLNESS:  Kayla Harrison is a 71 y.o. female with a PMH as outlined below including but not limited to breast CA (s/p lumpectomy and XRT 2010), thyroid CA (s/p thyroidectomy 1999).  She presented to [facility] ED on 5/14 with worsening facial swelling along with SOB. She states that facial swelling started some 3 weeks ago and has persisted. She initially thought that this was due to the pollen so did not think much of it; however as she had no additional allergic symptoms along with persistence and swelling and new onset of gradual SOB, she knew that something else was going on. On 5/13, she had worsening of symptoms; therefore, 5/14 she came to the ED for further evaluation.  She was found to have PAF with RVR. She was seen by EP and was started on amiodarone.  Additionally, CT of the chest demonstrated a large superior mediastinal mass contiguous with the right hilar region resulting in significant narrowing of the SVC and collateral vessels. Findings very suspicious for malignancy. Also, small pulmonary nodules, largest 58m in RUL.  Given her CT findings, PCCM was asked to see in consultation. She denies any weight loss, hemoptysis. She is a non-smoker and has never smoked in her life. She had lumpectomy and radiation in 2010 for breast cancer and also thyroidectomy 1999 for thyroid cancer. Otherwise, no additional history of malignancies.  PAST MEDICAL HISTORY :   has a past medical history of Arthritis; Atrial fibrillation ([place]; Breast cancer ([place]; Colon polyp; Complication of anesthesia; Dyslipidemia; Dysrhythmia; Hepatitis; thyroid cancer; Hyperlipidemia; Hypothyroidism; Morbid obesity ([place]; Normal cardiac stress test; Pacemaker-[employer]; Pneumonia (2010); Renal calculi; Sinus node dysfunction  ([facility]); and Stroke ([place].  has a past surgical history that includes Thyroidectomy (1998); bso (1998); Cholecystectomy; Tonsillectomy; Colonoscopy w/ polypectomy; afib ablation; Abdominal hysterectomy (1983); Breast lumpectomy (2010); Laparoscopic gastric banding (07/10/2010); Appendectomy; Knee arthroscopy; laparotomy; Total knee arthroplasty (04/13/2012); Joint replacement (04/13/12); Cardioversion (10/09/2012); Cardioversion (10/09/2012); Cardioversion (N/A, 11/20/2012); Transabdominal ablation (03/02/13); Pacemaker insertion (03/10/13); convergent; pocket revision (N/A, 12/08/2013); Eye surgery; Cystoscopy with retrograde pyelogram, ureteroscopy and stent placement (Right, 02/06/2015); Holmium laser application (N/A, 59/37/9024; and Cystoscopy (N/A, 02/06/2015). Prior to Admission medications   Medication Sig Start Date End Date Taking? Authorizing Provider  bisoprolol (ZEBETA) 5 MG tablet Take 1/2 tablet (2.5 mg) by mouth once daily 12/25/16  Yes KDeboraha Sprang MD  cycloSPORINE (RESTASIS) 0.05 % ophthalmic emulsion Place 1 drop into both eyes 2 (two) times daily.   Yes [provider]  furosemide (LASIX) 40 MG tablet Take 40 mg by mouth daily as needed for fluid.    Yes [provider]  levothyroxine (SYNTHROID, LEVOTHROID) 175 MCG tablet Take 87.5-175 mcg by mouth daily before breakfast. TAKE 1 TABLET BY MOUTH EVERY DAY EXCEPT, TAKE 1/2 TABLET ON Friday AND NO TABLET ON Saturday   Yes [provider]  Lifitegrast (Shirley Friar 5 % SOLN Apply 1 drop to eye 2 (two) times daily.   Yes [provider]  Multiple Vitamin (MULTIVITAMIN) tablet Take 1 tablet by mouth daily.    Yes [provider]  warfarin (COUMADIN) 5 MG tablet TAKE AS DIRECTED BY  [facility] 10/07/16  Yes KDeboraha Sprang MD   Allergies  Allergen Reactions  . Tikosyn [Dofetilide]     Prolonged QT interval  Prolonged QT interval  . Xarelto [Rivaroxaban]     Nose Bleed X 6 hrs ; packing in ER  .  Epinephrine Other (See Comments) and Rash    Dental form only (liquid). Patient stated she will become "out of it" she can hear you but cannot respond in normal fashion.  Marland Kitchen Ketamine     Hallucinations  . Eliquis [Apixaban] Other (See Comments)    BLEEDING IN URINE    FAMILY HISTORY:  family history includes Cancer in her father; Diabetes in her brother, paternal aunt, paternal grandmother, and sister; Heart disease (age of onset: 49) in her father. SOCIAL HISTORY:  reports that she has never smoked. She has never used smokeless tobacco. She reports that she does not drink alcohol or use drugs.  REVIEW OF SYSTEMS:   All negative; except for those that are bolded, which indicate positives.  Constitutional: weight loss, weight gain, night sweats, fevers, chills, fatigue, weakness.  HEENT: headaches, sore throat, sneezing, nasal congestion, post nasal drip, difficulty swallowing, tooth/dental problems, visual complaints, visual changes, ear aches, facial swelling. Neuro: difficulty with speech, weakness, numbness, ataxia. CV:  chest pain, orthopnea, PND, swelling in lower extremities, dizziness, palpitations, syncope.  Resp: cough, hemoptysis, dyspnea, wheezing. GI: heartburn, indigestion, abdominal pain, nausea, vomiting, diarrhea, constipation, change in bowel habits, loss of appetite, hematemesis, melena, hematochezia.  GU: dysuria, change in color of urine, urgency or frequency, flank pain, hematuria. MSK: joint pain or swelling, decreased range of motion. Psych: change in mood or affect, depression, anxiety, suicidal ideations, homicidal ideations. Skin: rash, itching, bruising.  SUBJECTIVE:   No issues overnight. Stable resp status  VITAL SIGNS: Temp:  [97.6 F (36.4 C)-98 F (36.7 C)] 97.6 F (36.4 C) (05/18 0938) Pulse Rate:  [69-70] 69 (05/18 0455) Resp:  [18] 18 (05/18 0938) BP: (122-125)/(76-80) 125/80 (05/18 0455) SpO2:  [98 %-100 %] 100 % (05/18 0938)  PHYSICAL  EXAMINATION: Blood pressure 125/80, pulse 69, temperature 97.6 F (36.4 C), temperature source Oral, resp. rate 18, height '5\' 6"'$  (1.676 m), weight 224 lb 8 oz (101.8 kg), SpO2 100 %. Gen:      No acute distress HEENT:  EOMI, sclera anicteric Neck:     No masses; no thyromegaly Lungs:    Clear to auscultation bilaterally; normal respiratory effort CV:         Regular rate and rhythm; no murmurs Abd:      + bowel sounds; soft, non-tender; no palpable masses, no distension Ext:    No edema; adequate peripheral perfusion Skin:      Warm and dry; no rash Neuro: alert and oriented x 3 Psych: normal mood and affect   Recent Labs Lab 02/05/17 0239 02/06/17 0236 02/07/17 0317  NA 139 140 140  K 3.7 4.2 4.1  CL 107 108 106  CO2 '24 27 25  '$ BUN 22* 22* 18  CREATININE 1.08* 0.95 0.95  GLUCOSE 122* 110* 100*    Recent Labs Lab 02/05/17 0239 02/06/17 0236 02/07/17 0317  HGB 12.0 12.1 11.8*  HCT 38.8 38.3 37.4  WBC 6.1 6.2 6.0  PLT 204 194 190   Ct Head Wo Contrast  Result Date: 02/05/2017 CLINICAL DATA:  Headache for 1 month. EXAM: CT HEAD WITHOUT CONTRAST TECHNIQUE: Contiguous axial images were obtained from the base of the skull through the vertex without intravenous contrast. COMPARISON:  04/09/2016 FINDINGS: Brain: No evidence of acute infarction, hemorrhage, hydrocephalus, extra-axial collection or mass lesion/mass effect. Chronic small-vessel white matter ischemic changes again noted. Vascular:  Intracranial atherosclerotic calcifications again identified. Skull: Normal. Negative for fracture or focal lesion. Sinuses/Orbits: No acute finding. Other: None. IMPRESSION: No evidence of acute intracranial abnormality. Chronic small-vessel white matter ischemic changes. Electronically Signed   By: Margarette Canada M.D.   On: 02/05/2017 14:01    STUDIES:  CT chest 5/14 >  large mediastinal mass extending into the right hilar region with narrowing of the SVC with narrowing of SVC and collateral  vessels. Multiple small pulmonary nodules. I re reviewed all images personally.  SIGNIFICANT EVENTS  5/14 > admit.  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 No change in clinical status INR has improved to 1.39, CBC is stable Will proceed with EBUS today  Marshell Garfinkel MD [facility] Pager [phone] If no answer or after 3pm call: [phone] 02/07/2017, 12:09 PM

## 2017-02-07 NOTE — Progress Notes (Signed)
Hematology oncology I was unable to see the patient because she was transported for CT simulation yesterday. I will arrange the patient for an outpatient follow-up to review the pathology report from the EBUS being done today. Patient was made an appointment on 02/13/2017 at 12:30 Noon (prior to her radiation treatment at 2:15 PM) Please provide the patient with this appointment. Thank You

## 2017-02-07 NOTE — Progress Notes (Signed)
LATE ENTRY Verbal order given by Haynes Hoehn MD that patient can travel for test without cardiac monitoring, will continue to monitor.

## 2017-02-07 NOTE — Procedures (Signed)
Video Bronchoscopy with Endobronchial Ultrasound guided biopsy Procedure Note  Date of Operation: 07/03/2016  Pre-op Diagnosis: Lung nodules and mediastinal mas  Post-op Diagnosis: Same  Surgeon: Marshell Garfinkel  Assistants:   Anesthesia: General endotracheal anesthesia  Operation: Flexible video fiberoptic bronchoscopy with endobronchial ultrasound and biopsies.  Estimated Blood Loss: Minimal  Complications: None apparent  Indications and History: Ms. Secord is a 71  y.o. female with hx breast ca, thyroid presents with mediastinal mass, sub cm lung nodules, SVC syndrome. Recommendation made to achieve tissue sampling via endobronchial ultrasound guided nodal biopsies. The risks, benefits, complications, treatment options and expected outcomes were discussed with the patient.  The possibilities of pneumothorax, pneumonia, reaction to medication, pulmonary aspiration, perforation of a viscus, bleeding, failure to diagnose a condition and creating a complication requiring transfusion or operation were discussed with the patient who freely signed the consent.    Description of Procedure: The patient was examined in the preoperative area and history and data from the preprocedure consultation were reviewed. It was deemed appropriate to proceed.  The patient was taken to OR 10, identified as Chiropractor and the procedure verified as Flexible Video Fiberoptic Bronchoscopy.  A Time Out was held and the above information confirmed. After being taken to the operating room general anesthesia was initiated and the patient  was orally intubated. The video fiberoptic bronchoscope was introduced via the endotracheal tube and a general inspection was performed which showed narrowing of the trachea and rt main stem bronchus from extrinsic compression. No endobronchial lesions were identified.  The standard scope was then withdrawn and the endobronchial ultrasound was used to identify and  characterize the mediastinal mass and lymph nodes. Inspection showed enlargement of station 7 nodes. Using real-time ultrasound guidance Wang needle biopsies were take from Station 7 and mediastinal mass and were sent for cytology.  At the end of the procedure a general airway inspection was performed and there was no evidence of active bleeding. The bronchoscope was removed.  The patient tolerated the procedure well. There was no significant blood loss and there were no obvious complications. A post-procedural chest x-ray is pending.   Samples: 1. Wang needle biopsies from mediastinal mass. 2. Wang needle biopsies from 7 node.  Plans:  The patient will be discharged from the PACU to ward when recovered from anesthesia and after chest x-ray is reviewed. [place] for discharge in AM. We will review the cytology they become available. Outpatient followup will be with Dr. Lindi Adie and Dr. Lisbeth Renshaw.   Marshell Garfinkel MD [facility] Pager [phone] If no answer or after 3pm call: [phone] 02/07/2017, 3:31 PM

## 2017-02-07 NOTE — Transfer of Care (Signed)
Immediate Anesthesia Transfer of Care Note  Patient: Kayla Harrison  Procedure(s) Performed: Procedure(s): VIDEO BRONCHOSCOPY WITH ENDOBRONCHIAL ULTRASOUND (N/A)  Patient Location: PACU  Anesthesia Type:General  Level of Consciousness: awake, alert  and oriented  Airway & Oxygen Therapy: Patient Spontanous Breathing and Patient connected to nasal cannula oxygen  Post-op Assessment: Report given to RN and Post -op Vital signs reviewed and stable  Post vital signs: Reviewed and stable  Last Vitals:  Vitals:   02/07/17 0938 02/07/17 1517  BP:  98/73  Pulse:  60  Resp: 18 12  Temp: 36.4 C 36.5 C    Last Pain:  Vitals:   02/07/17 1517  TempSrc:   PainSc: 0-No pain      Patients Stated Pain Goal: 0 (96/04/54 0981)  Complications: No apparent anesthesia complications

## 2017-02-08 ENCOUNTER — Encounter (HOSPITAL_COMMUNITY): Payer: Self-pay | Admitting: Pulmonary Disease

## 2017-02-08 LAB — CBC
HEMATOCRIT: 36.9 % (ref 36.0–46.0)
Hemoglobin: 11.4 g/dL — ABNORMAL LOW (ref 12.0–15.0)
MCH: 28.1 pg (ref 26.0–34.0)
MCHC: 30.9 g/dL (ref 30.0–36.0)
MCV: 90.9 fL (ref 78.0–100.0)
Platelets: 186 10*3/uL (ref 150–400)
RBC: 4.06 MIL/uL (ref 3.87–5.11)
RDW: 14.3 % (ref 11.5–15.5)
WBC: 7.1 10*3/uL (ref 4.0–10.5)

## 2017-02-08 LAB — BASIC METABOLIC PANEL
ANION GAP: 8 (ref 5–15)
BUN: 15 mg/dL (ref 6–20)
CALCIUM: 8.9 mg/dL (ref 8.9–10.3)
CO2: 24 mmol/L (ref 22–32)
Chloride: 106 mmol/L (ref 101–111)
Creatinine, Ser: 1.01 mg/dL — ABNORMAL HIGH (ref 0.44–1.00)
GFR, EST NON AFRICAN AMERICAN: 54 mL/min — AB (ref >60)
GLUCOSE: 115 mg/dL — AB (ref 65–99)
POTASSIUM: 4.1 mmol/L (ref 3.5–5.1)
SODIUM: 138 mmol/L (ref 135–145)

## 2017-02-08 LAB — PROTIME-INR
INR: 1.27
Prothrombin Time: 16 seconds — ABNORMAL HIGH (ref 11.4–15.2)

## 2017-02-08 MED ORDER — LEVOTHYROXINE SODIUM 125 MCG PO TABS: 125.0000 ug | ORAL_TABLET | Freq: Every day | ORAL | 0 refills | Status: DC

## 2017-02-08 MED ORDER — AMIODARONE HCL 200 MG PO TABS: 400.0000 mg | ORAL_TABLET | Freq: Two times a day (BID) | ORAL | 0 refills | Status: DC

## 2017-02-08 MED ORDER — ENOXAPARIN SODIUM 150 MG/ML ~~LOC~~ SOLN: 150.0000 mg | SUBCUTANEOUS | 0 refills | Status: DC

## 2017-02-08 MED ORDER — ENOXAPARIN SODIUM 150 MG/ML ~~LOC~~ SOLN
150.0000 mg | SUBCUTANEOUS | Status: DC
  Administered 2017-02-08: 150 mg via SUBCUTANEOUS
  Filled 2017-02-08: qty 1

## 2017-02-08 MED ORDER — ENOXAPARIN SODIUM 150 MG/ML ~~LOC~~ SOLN: 1.5000 mg/kg | SUBCUTANEOUS | Status: DC

## 2017-02-08 NOTE — Progress Notes (Signed)
Name: Kayla Harrison MRN: [id number] DOB: February 23, 1946    ADMISSION DATE:  02/03/2017 CONSULTATION DATE:  02/03/17  REFERRING MD :  Eulas Post  CHIEF COMPLAINT:  Facial swelling, SOB   HISTORY OF PRESENT ILLNESS:  Kayla Harrison is a 71 y.o. female with a PMH as outlined below including but not limited to breast CA (s/p lumpectomy and XRT 2010), thyroid CA (s/p thyroidectomy 1999).  She presented to [facility] ED on 5/14 with worsening facial swelling along with SOB. She states that facial swelling started some 3 weeks ago and has persisted. She initially thought that this was due to the pollen so did not think much of it; however as she had no additional allergic symptoms along with persistence and swelling and new onset of gradual SOB, she knew that something else was going on. On 5/13, she had worsening of symptoms; therefore, 5/14 she came to the ED for further evaluation.  She was found to have PAF with RVR. She was seen by EP and was started on amiodarone.  Additionally, CT of the chest demonstrated a large superior mediastinal mass contiguous with the right hilar region resulting in significant narrowing of the SVC and collateral vessels. Findings very suspicious for malignancy. Also, small pulmonary nodules, largest 5m in RUL.  Given her CT findings, PCCM was asked to see in consultation. She denies any weight loss, hemoptysis. She is a non-smoker and has never smoked in her life. She had lumpectomy and radiation in 2010 for breast cancer and also thyroidectomy 1999 for thyroid cancer. Otherwise, no additional history of malignancies.  PAST MEDICAL HISTORY :   has a past medical history of Arthritis; Atrial fibrillation ([place]; Breast cancer ([place]; Colon polyp; Complication of anesthesia; Dyslipidemia; Dysrhythmia; Hepatitis; thyroid cancer; Hyperlipidemia; Hypothyroidism; Morbid obesity ([place]; Normal cardiac stress test; Pacemaker-[employer]; Pneumonia (2010); Renal calculi; Sinus node dysfunction  (HCC); and Stroke ([place].  has a past surgical history that includes Thyroidectomy (1998); bso (1998); Cholecystectomy; Tonsillectomy; Colonoscopy w/ polypectomy; afib ablation; Abdominal hysterectomy (1983); Breast lumpectomy (2010); Laparoscopic gastric banding (07/10/2010); Appendectomy; Knee arthroscopy; laparotomy; Total knee arthroplasty (04/13/2012); Joint replacement (04/13/12); Cardioversion (10/09/2012); Cardioversion (10/09/2012); Cardioversion (N/A, 11/20/2012); Transabdominal ablation (03/02/13); Pacemaker insertion (03/10/13); convergent; pocket revision (N/A, 12/08/2013); Eye surgery; Cystoscopy with retrograde pyelogram, ureteroscopy and stent placement (Right, 02/06/2015); Holmium laser application (N/A, 59/56/2130; Cystoscopy (N/A, 02/06/2015); and Video bronchoscopy with endobronchial ultrasound (N/A, 02/07/2017). Prior to Admission medications   Medication Sig Start Date End Date Taking? Authorizing Provider  bisoprolol (ZEBETA) 5 MG tablet Take 1/2 tablet (2.5 mg) by mouth once daily 12/25/16  Yes KDeboraha Sprang MD  cycloSPORINE (RESTASIS) 0.05 % ophthalmic emulsion Place 1 drop into both eyes 2 (two) times daily.   Yes [provider]  furosemide (LASIX) 40 MG tablet Take 40 mg by mouth daily as needed for fluid.    Yes [provider]  levothyroxine (SYNTHROID, LEVOTHROID) 175 MCG tablet Take 87.5-175 mcg by mouth daily before breakfast. TAKE 1 TABLET BY MOUTH EVERY DAY EXCEPT, TAKE 1/2 TABLET ON Friday AND NO TABLET ON Saturday   Yes [provider]  Lifitegrast (Shirley Friar 5 % SOLN Apply 1 drop to eye 2 (two) times daily.   Yes [provider]  Multiple Vitamin (MULTIVITAMIN) tablet Take 1 tablet by mouth daily.    Yes [provider]  warfarin (COUMADIN) 5 MG tablet TAKE AS DIRECTED BY  COUMADIN CLINIC 10/07/16  Yes KDeboraha Sprang MD   Allergies  Allergen Reactions  . Tikosyn [Dofetilide]  Prolonged QT interval Prolonged QT interval  .  Xarelto [Rivaroxaban]     Nose Bleed X 6 hrs ; packing in ER  . Epinephrine Other (See Comments) and Rash    Dental form only (liquid). Patient stated she will become "out of it" she can hear you but cannot respond in normal fashion.  Marland Kitchen Ketamine     Hallucinations  . Eliquis [Apixaban] Other (See Comments)    BLEEDING IN URINE    FAMILY HISTORY:  family history includes Cancer in her father; Diabetes in her brother, paternal aunt, paternal grandmother, and sister; Heart disease (age of onset: 66) in her father. SOCIAL HISTORY:  reports that she has never smoked. She has never used smokeless tobacco. She reports that she does not drink alcohol or use drugs.  REVIEW OF SYSTEMS:   All negative; except for those that are bolded, which indicate positives.  Constitutional: weight loss, weight gain, night sweats, fevers, chills, fatigue, weakness.  HEENT: headaches, sore throat, sneezing, nasal congestion, post nasal drip, difficulty swallowing, tooth/dental problems, visual complaints, visual changes, ear aches, lip swelling. Neuro: difficulty with speech, weakness, numbness, ataxia. CV:  chest pain, orthopnea, PND, swelling in lower extremities, dizziness, palpitations, syncope.  Resp: cough, hemoptysis, dyspnea, wheezing. GI: heartburn, indigestion, abdominal pain, nausea, vomiting, diarrhea, constipation, change in bowel habits, loss of appetite, hematemesis, melena, hematochezia.  GU: dysuria, change in color of urine, urgency or frequency, flank pain, hematuria. MSK: joint pain or swelling, decreased range of motion. Psych: change in mood or affect, depression, anxiety, suicidal ideations, homicidal ideations. Skin: rash, itching, bruising.  SUBJECTIVE:   Underwent EBUS yesterday. She tolerated the procedure well. Has some streaks of hemoptysis last night but it has resolved today AM She has some lip swelling but no tongue swelling No dyspnea, stridor, wheezing.   VITAL  SIGNS: Temp:  [97.6 F (36.4 C)-98.5 F (36.9 C)] 98.5 F (36.9 C) (05/19 0443) Pulse Rate:  [60-72] 72 (05/19 0443) Resp:  [12-18] 18 (05/19 0443) BP: (98-125)/(53-73) 120/60 (05/19 0443) SpO2:  [98 %-100 %] 99 % (05/19 0443)  PHYSICAL EXAMINATION: Blood pressure 120/60, pulse 72, temperature 98.5 F (36.9 C), temperature source Oral, resp. rate 18, height '5\' 6"'$  (1.676 m), weight 224 lb 8 oz (101.8 kg), SpO2 99 %. Gen:      No acute distress HEENT:  EOMI, sclera anicteric Neck:     No masses; no thyromegaly Lungs:    Clear to auscultation bilaterally; normal respiratory effort CV:         Regular rate and rhythm; no murmurs Abd:      + bowel sounds; soft, non-tender; no palpable masses, no distension Ext:    No edema; adequate peripheral perfusion Skin:      Warm and dry; no rash Neuro: alert and oriented x 3 Psych: normal mood and affect   Recent Labs Lab 02/06/17 0236 02/07/17 0317 02/08/17 0233  NA 140 140 138  K 4.2 4.1 4.1  CL 108 106 106  CO2 '27 25 24  '$ BUN 22* 18 15  CREATININE 0.95 0.95 1.01*  GLUCOSE 110* 100* 115*    Recent Labs Lab 02/06/17 0236 02/07/17 0317 02/08/17 0233  HGB 12.1 11.8* 11.4*  HCT 38.3 37.4 36.9  WBC 6.2 6.0 7.1  PLT 194 190 186    STUDIES:  CT chest 5/14 >  large mediastinal mass extending into the right hilar region with narrowing of the SVC with narrowing of SVC and collateral vessels. Multiple small pulmonary nodules.  CXR 5/18 > right mediastinal mass. No evidence of pneumothorax post biopsy, lung infiltrate or effusion. I have reviewed all images personally.  SIGNIFICANT EVENTS  5/14 > admit. 5/18 > EBUS  ASSESSMENT / PLAN: Large mediastinal mass with SVC syndrome Prelim path suggests lymphoma She has been set up to start radiation on 5/21 as outpatient and has follow up with oncology  - Follow final path results - Follow with with oncology - Ok to resume anticoagulation - Can use OTC cough, pain medication  PRN  OK for discharge from our perspective. PCCM will be available as needed.   Marshell Garfinkel MD [facility] Pager [phone] If no answer or after 3pm call: [phone] 02/08/2017, 10:00 AM

## 2017-02-08 NOTE — Discharge Summary (Signed)
[facility]  Physician Discharge Summary   Patient ID: Kayla Harrison MRN: [id number] DOB/AGE: [birth year] 71 y.o.  Admit date: 02/03/2017 Discharge date: 02/08/2017  PCP: [name], MD  DISCHARGE DIAGNOSES:  Principal Problem:   Lung mass Active Problems:   Atrial fibrillation ([place])   Long term (current) use of anticoagulants   Hypothyroidism   Superior vena cava syndrome   Abnormal chest x-ray   RECOMMENDATIONS FOR OUTPATIENT FOLLOW UP: 1. Patient to start radiation treatment on Monday for Superior vena cava syndrome 2. Pathology from her recent biopsy is pending 3. Patient has been scheduled appointment with oncology 4. She will need repeat thyroid function tests in 4-6 weeks.   DISCHARGE CONDITION: fair  Diet recommendation: As before  [facility] Weights   02/03/17 0945 02/03/17 2129  Weight: 104.3 kg (230 lb) 101.8 kg (224 lb 8 oz)    INITIAL HISTORY: 71 year old Caucasian female with a past medical history breast and thyroid cancer, prior history of CVA, history of paroxysmal atrial fibrillation, anticoagulated with warfarin, history of pacemaker placement, presented with complaints of shortness of breath, dizziness and facial swelling. Evaluation revealed a mediastinal mass causing compression of the superior vena cava. Patient was hospitalized for further management.  Consultations:  Cardiology  Pulmonology  Radiation oncology  Medical oncology  Procedures:  Endobronchial ultrasound and biopsy  HOSPITAL COURSE:   Superior mediastinal mass compressing SVC/SVC syndrome Patient presented with complaints of headache, dyspnea facial swelling. CT scan revealed a superior mediastinal mass compressing the SVC. Patient was hospitalized. She was seen by pulmonology. Endobronchial ultrasound and biopsy was performed. Early pathology reports suggests lymphoma. Await final report. Patient had mild symptoms related to her SVC syndrome. She was seen by  radiation oncology. Plan is for radiation treatment to start on Monday. After biopsy patient did have hemoptysis but it quickly subsided. She feels well this morning. She would like to go home. Cleared by pulmonology. Patient's case discussed with medical oncology briefly. They did try to see the patient but she was not available due to procedure. Outpatient appointment has been made for her. CT head did not show any acute findings. Okay to resume anticoagulation.  History of paroxysmal atrial fibrillation. Followed by cardiology and was on anticoagulation with warfarin. She did have spurts of atrial fibrillation during early part of this hospitalization. She was given amiodarone boluses. Started on oral amiodarone. She also was given a dose of digoxin. She is in sinus rhythm. TSH was noted to be low with elevated free T4 level. Her Synthroid dose was adjusted. Warfarin can be reinitiated. Cardiology wants bridging with Lovenox. This will be ordered and prescribed. Amiodarone will also be prescribed at discharge. She will need to follow-up with Dr. Caryl Comes or the atrial fibrillation clinic within this week. She tells me that she will be able to see him for INR check this week.    History of hypothyroidism.  TSH is low at 0.20. Free T4 is high at 1.30. Dose of levothyroxine was reduced. Will need repeat thyroid function tests in 4-6 weeks.  Previous history of breast and thyroid cancer Her thyroid cancer was managed with Dr. Forde Dandy, 18 years ago. Breasts cancer was managed by Dr. Humphrey Rolls, 8 years ago, who is no longer in this area.   Overall, stable. Patient keen on going home today. Okay for discharge. Instructed to come back to the hospital if the symptoms recur.    PERTINENT LABS:  The results of significant diagnostics from this hospitalization (including imaging, microbiology,  ancillary and laboratory) are listed below for reference.     Labs: Basic Metabolic Panel:  Recent Labs Lab  02/04/17 0204 02/05/17 0239 02/06/17 0236 02/07/17 0317 02/08/17 0233  NA 139 139 140 140 138  K 3.5 3.7 4.2 4.1 4.1  CL 106 107 108 106 106  CO2 '26 24 27 25 24  '$ GLUCOSE 102* 122* 110* 100* 115*  BUN 18 22* 22* 18 15  CREATININE 0.94 1.08* 0.95 0.95 1.01*  CALCIUM 8.7* 8.6* 9.0 8.8* 8.9   Liver Function Tests:  Recent Labs Lab 02/04/17 0204  AST 18  ALT 16  ALKPHOS 65  BILITOT 0.8  PROT 5.8*  ALBUMIN 3.2*   CBC:  Recent Labs Lab 02/03/17 0947 02/05/17 0239 02/06/17 0236 02/07/17 0317 02/08/17 0233  WBC 7.8 6.1 6.2 6.0 7.1  HGB 13.7 12.0 12.1 11.8* 11.4*  HCT 44.1 38.8 38.3 37.4 36.9  MCV 90.2 89.8 90.5 89.9 90.9  PLT 227 204 194 190 186   BNP: BNP (last 3 results)  Recent Labs  02/03/17 0947  BNP 70.5    IMAGING STUDIES Dg Chest 2 View  Result Date: 02/07/2017 CLINICAL DATA:  Status post bronchoscopy with biopsy. EXAM: CHEST  2 VIEW COMPARISON:  02/03/2017 CT, chest radiograph and prior exams FINDINGS: Cardiomegaly, left atrial clip and left-sided pacemaker again noted. A superior right mediastinal mass is again identified. There is no evidence of pneumothorax or pleural effusion. No airspace disease identified. IMPRESSION: No evidence of acute abnormality. No evidence of pneumothorax or pleural effusion. Superior right mediastinal mass again identified. Electronically Signed   By: Margarette Canada M.D.   On: 02/07/2017 16:02   Dg Chest 2 View  Result Date: 02/03/2017 CLINICAL DATA:  71 year old female with increased shortness breath over the past 5-6 days. Thyroid cancer. Breast cancer. Initial encounter. EXAM: CHEST  2 VIEW COMPARISON:  06/30/2015 chest x-ray FINDINGS: Abnormality right paratracheal region suspicious for mass. Vascular abnormality secondary consideration. Chest CT recommended for further evaluation. Post thyroidectomy. Prior clipping left atrium. Sequential pacemaker in place. Heart size within normal limits. No infiltrate, congestive heart  failure or pneumothorax. Erosion distal right clavicle possibly degenerative in origin. Calcified aorta. IMPRESSION: Abnormality right paratracheal region suspicious for mass. Vascular abnormality secondary consideration. Chest CT recommended for further evaluation. Post thyroidectomy and breast surgery. Prior clipping left atrium. Sequential pacemaker in place. Heart size within normal limits. No infiltrate or congestive heart failure. Erosion distal right clavicle possibly degenerative in origin. These results were called by telephone at the time of interpretation on 02/03/2017 at 10:11 am to Dr. Lajean Saver , who verbally acknowledged these results. Electronically Signed   By: Genia Del M.D.   On: 02/03/2017 10:15   Ct Head Wo Contrast  Result Date: 02/05/2017 CLINICAL DATA:  Headache for 1 month. EXAM: CT HEAD WITHOUT CONTRAST TECHNIQUE: Contiguous axial images were obtained from the base of the skull through the vertex without intravenous contrast. COMPARISON:  04/09/2016 FINDINGS: Brain: No evidence of acute infarction, hemorrhage, hydrocephalus, extra-axial collection or mass lesion/mass effect. Chronic small-vessel white matter ischemic changes again noted. Vascular: Intracranial atherosclerotic calcifications again identified. Skull: Normal. Negative for fracture or focal lesion. Sinuses/Orbits: No acute finding. Other: None. IMPRESSION: No evidence of acute intracranial abnormality. Chronic small-vessel white matter ischemic changes. Electronically Signed   By: Margarette Canada M.D.   On: 02/05/2017 14:01   Ct Chest W Contrast  Result Date: 02/03/2017 CLINICAL DATA:  Pt c/o difficulty breathing dizziness syncope, Afib off and on  but on almost all day today. History of thyroid cancer and breast cancer. EXAM: CT CHEST WITH CONTRAST TECHNIQUE: Multidetector CT imaging of the chest was performed during intravenous contrast administration. CONTRAST:  67m ISOVUE-300 IOPAMIDOL (ISOVUE-300) INJECTION 61%  COMPARISON:  02/03/2017 and CT of the chest on 04/21/2013 FINDINGS: Cardiovascular: There is extensive coronary artery calcification. Left atrial appendage clip is in place. Mitral annulus calcification. The heart is mildly enlarged. No pericardial effusion. Bovine arch anatomy. There is narrowing of the superior vena cava secondary to mediastinal mass described below. There is filling and numerous collateral vessels. Left upper extremity contrast injection. Mediastinum/Nodes: Superior mediastinal mass is 5.4 x 7.1 x 6.0 cm. Mass results in significant narrowing of the superior vena cava and has a long interface with the ascending aorta, trachea, superior vena cava, and right brachiocephalic artery. Mass is contiguous with the right hilar structures and encases the trachea and right mainstem bronchus. Small mediastinal lymph nodes are identified in the subcarinal region (1.6 cm) left hilar region (1.0 cm). Lungs/Pleura: Multiple small pulmonary nodules are present. In the right upper lobe, a 6 mm nodule is identified on image 39 of series 7. In the right upper lobe a 4 mm nodule is identified on image 50 of series 7. In the left lower lobe, a 5 mm nodule is identified on image 90 of series 7. There are no pleural effusions or focal consolidations. Upper Abdomen: Multiple calcified granulomata are identified within the spleen. Patient has a lap band in place around the proximal stomach. A small low-attenuation lesion is identified in the left hepatic lobe measuring 9 mm on image 117 of series 3, stable in appearance and consistent with cyst. Small calcified hepatic granulomata are present. The adrenal glands are normal in appearance. Bilateral intrarenal calculi. Musculoskeletal: Degenerative changes are seen in the thoracic spine. No suspicious lytic or blastic lesions are identified. IMPRESSION: 1. Large superior mediastinal mass contiguous with the right hilar region resulting in significant narrowing of the  superior vena cava and collateral vessels filling. Findings are suspicious for malignancy, either primary secondary. 2. Small pulmonary nodules as described, largest measuring 6 mm in the right upper lobe. 3. Coronary artery disease. Mitral annulus calcification. Cardiomegaly. 4. Bovine arch anatomy. 5. Prior granulomatous disease. Numerous calcified granulomata within the liver and spleen. 6. Hepatic cyst. Electronically Signed   By: ENolon NationsM.D.   On: 02/03/2017 17:30    DISCHARGE EXAMINATION: Vitals:   02/07/17 1626 02/07/17 2118 02/08/17 0443 02/08/17 1156  BP: (!) 122/53 125/60 120/60 120/60  Pulse: 64 64 72 78  Resp: '16 17 18 18  '$ Temp: 97.6 F (36.4 C) 98.3 F (36.8 C) 98.5 F (36.9 C) 98.2 F (36.8 C)  TempSrc: Oral Oral Oral Oral  SpO2: 100% 98% 99% 98%  Weight:      Height:       General appearance: alert, cooperative, appears stated age and no distress Resp: clear to auscultation bilaterally Cardio: regular rate and rhythm, S1, S2 normal, no murmur, click, rub or gallop GI: soft, non-tender; bowel sounds normal; no masses,  no organomegaly Extremities: extremities normal, atraumatic, no cyanosis or edema Neurologic: Grossly normal  DISPOSITION: Home  Discharge Instructions    Call MD for:  difficulty breathing, headache or visual disturbances    Complete by:  As directed    Call MD for:  extreme fatigue    Complete by:  As directed    Call MD for:  persistant dizziness or light-headedness  Complete by:  As directed    Call MD for:  persistant nausea and vomiting    Complete by:  As directed    Call MD for:  severe uncontrolled pain    Complete by:  As directed    Call MD for:  temperature >100.4    Complete by:  As directed    Diet - low sodium heart healthy    Complete by:  As directed    Discharge instructions    Complete by:  As directed    Please be sure to follow-up with Dr. Caryl Comes or somebody in his clinic to have her PT/INR checked in 3-4 days.  Continue taking Lovenox and warfarin until then. Your thyroid medication dose has been reduced due to high thyroid hormone levels in the hospital. Your thyroid hormones will need to be rechecked in 4-6 weeks. Be sure to go for your radiation treatments starting Monday. Seek attention immediately if symptoms get worse.  You were cared for by a hospitalist during your hospital stay. If you have any questions about your discharge medications or the care you received while you were in the hospital after you are discharged, you can call the unit and asked to speak with the hospitalist on call if the hospitalist that took care of you is not available. Once you are discharged, your primary care physician will handle any further medical issues. Please note that NO REFILLS for any discharge medications will be authorized once you are discharged, as it is imperative that you return to your primary care physician (or establish a relationship with a primary care physician if you do not have one) for your aftercare needs so that they can reassess your need for medications and monitor your lab values. If you do not have a primary care physician, you can call [phone] for a physician referral.   Increase activity slowly    Complete by:  As directed       ALLERGIES:  Allergies  Allergen Reactions  . Tikosyn [Dofetilide]     Prolonged QT interval Prolonged QT interval  . Xarelto [Rivaroxaban]     Nose Bleed X 6 hrs ; packing in ER  . Epinephrine Other (See Comments) and Rash    Dental form only (liquid). Patient stated she will become "out of it" she can hear you but cannot respond in normal fashion.  Marland Kitchen Ketamine     Hallucinations  . Eliquis [Apixaban] Other (See Comments)    BLEEDING IN URINE     Discharge Medication List as of 02/08/2017 12:17 PM    START taking these medications   Details  amiodarone (PACERONE) 200 MG tablet Take 2 tablets (400 mg total) by mouth 2 (two) times daily., Starting Sat  02/08/2017, Print    enoxaparin (LOVENOX) 150 MG/ML injection Inject 1 mL (150 mg total) into the skin daily., Starting Sat 02/08/2017, Print      CONTINUE these medications which have CHANGED   Details  levothyroxine (SYNTHROID, LEVOTHROID) 125 MCG tablet Take 1 tablet (125 mcg total) by mouth daily before breakfast., Starting Sat 02/08/2017, Print      CONTINUE these medications which have NOT CHANGED   Details  bisoprolol (ZEBETA) 5 MG tablet Take 1/2 tablet (2.5 mg) by mouth once daily, Normal    cycloSPORINE (RESTASIS) 0.05 % ophthalmic emulsion Place 1 drop into both eyes 2 (two) times daily., Until Discontinued, Historical Med    furosemide (LASIX) 40 MG tablet Take 40 mg by mouth daily as  needed for fluid. , Historical Med    Lifitegrast (XIIDRA) 5 % SOLN Apply 1 drop to eye 2 (two) times daily., Historical Med    Multiple Vitamin (MULTIVITAMIN) tablet Take 1 tablet by mouth daily. , Historical Med    warfarin (COUMADIN) 5 MG tablet TAKE AS DIRECTED BY  COUMADIN CLINIC, Normal        Follow-up Information    Nicholas Lose, MD Follow up on 02/13/2017.   Specialty:  Hematology and Oncology Why:  at 12:30pm on 02/13/17 Contact information: [place] [id number] [place]: 91 mins  [facility] Pager [phone]  02/08/2017, 4:21 PM

## 2017-02-08 NOTE — Progress Notes (Signed)
Patient in a stable condition, discharge education reviewed with patient , she verbalized understanding, iv taken out , tele dc ccmd notified, patient belongings at bedside, patient taken off the unit on a wheelchair by this RN.

## 2017-02-10 ENCOUNTER — Ambulatory Visit
Admission: RE | Admit: 2017-02-10 | Discharge: 2017-02-10 | Disposition: A | Discharge status: Home / Self Care | Payer: Medicare Other | Source: Ambulatory Visit | Attending: Radiation Oncology | Admitting: Radiation Oncology

## 2017-02-10 ENCOUNTER — Telehealth: Payer: Self-pay | Admitting: *Deleted

## 2017-02-10 ENCOUNTER — Telehealth: Payer: Self-pay | Admitting: Internal Medicine

## 2017-02-10 ENCOUNTER — Ambulatory Visit: Payer: Medicare Other | Admitting: Radiation Oncology

## 2017-02-10 ENCOUNTER — Encounter: Payer: Self-pay | Admitting: Radiation Oncology

## 2017-02-10 ENCOUNTER — Other Ambulatory Visit: Payer: Self-pay | Admitting: Radiation Oncology

## 2017-02-10 ENCOUNTER — Telehealth (HOSPITAL_COMMUNITY): Payer: Self-pay | Admitting: *Deleted

## 2017-02-10 DIAGNOSIS — C383 Malignant neoplasm of mediastinum, part unspecified: Secondary | ICD-10-CM

## 2017-02-10 DIAGNOSIS — Z510 Encounter for antineoplastic radiation therapy: Secondary | ICD-10-CM | POA: Diagnosis not present

## 2017-02-10 DIAGNOSIS — C8332 Diffuse large B-cell lymphoma, intrathoracic lymph nodes: Secondary | ICD-10-CM | POA: Diagnosis not present

## 2017-02-10 LAB — PACEMAKER DEVICE OBSERVATION

## 2017-02-10 MED ORDER — SONAFINE EX EMUL
1.0000 "application " | Freq: Two times a day (BID) | CUTANEOUS | Status: DC
  Administered 2017-02-10: 1 via TOPICAL

## 2017-02-10 MED ORDER — TRAMADOL HCL 50 MG PO TABS: 50.0000 mg | ORAL_TABLET | Freq: Four times a day (QID) | ORAL | 0 refills | Status: DC | PRN

## 2017-02-10 NOTE — Telephone Encounter (Signed)
error 

## 2017-02-10 NOTE — Telephone Encounter (Signed)
New Message     They need forms filled out today about the patients pacemaker , they faxed them to [facility]

## 2017-02-10 NOTE — Progress Notes (Signed)
Pt education done, Radiation therapy and you book, my business card, sonafine cream given, discussed side effects, skin irritation, pain, fatigue, difficulty swallowing , may need to eat 5-6 smaller meals instead of 3 large one, with snacks(ensure,boost) between meals, get enough sleep, and rest, do some form of exercise daily, sough, shortness breath, loss hair where radiation is treated,  Sees MD weekly and prn, use uunscented dove soap is recommended

## 2017-02-10 NOTE — Telephone Encounter (Signed)
"  Dr. Roque Cash calling Dr. Lindi Adie.  He saw this patient in the hospital, Superior Vena Cava Syndrome.  She started radiation today.  Want to make sure he see's the biopsy performed for definitive diagnosis was completely inconclusive.  He'll need to try something else to determine what we're treating.  If he needs my help I can be reached directly at [phone].

## 2017-02-10 NOTE — Progress Notes (Signed)
The patient presented to the office today to begin her first fraction of radiotherapy to the mediastinal mass in her right chest which was identified by imaging as of last week. Her final pathology is still pending but is concerning for lymphoma. She has a left chest wall pacemaker managed by Dr. Caryl Comes. She reports sending a transmission on 02/03/17 which to my knowledge was normal. Dr. Caryl Comes has been out of the office and I've communicated with his partner, Dr. Harrington Challenger who recommended interrogating this prior to and following her radiation today. The [employer] rep Thayer Headings came to our department and confirmed that the pacemaker is functioning less tan 0.4% of the time, and we have proceeded with radiation today. The patient will send a transmission to [employer] this evening for Dr. Olin Pia review. We will still need formal signatures from Dr. Caryl Comes confirming this as well. The patient continues to have progressive bilateral upper extremity edema, collateral veins in the chest wall, and has noticed some of these accumulating along her anterior abdominal wall. She will be given a prescription for Tramadol for pain associated with her arm edema. We will proceed with treatment as outlined.     Carola Rhine, PAC

## 2017-02-10 NOTE — Telephone Encounter (Signed)
Procedural release form received from [place] this morning. Device information completed and returned.

## 2017-02-10 NOTE — Telephone Encounter (Signed)
-----   Message from Sherran Needs, NP sent at 02/10/2017  8:23 AM EDT ----- Please schedule this patient for a follow-up appointment.  Primary Cardiologist: Dr. Caryl Comes Date of Discharge: 02/08/2017 Appointment Needed Within: 2 weeks @ afib clinic  Appointment Type: post hospital   Thank you! Robbie Lis, PA-C

## 2017-02-10 NOTE — Telephone Encounter (Signed)
New message      Request for surgical clearance:  1. What type of surgery is being performed?  Port and treat radiation  2. When is this surgery scheduled?  12:40 today 3. Are there any medications that need to be held prior to surgery and how long? Pt has pacemaker  Name of physician performing surgery?   What is your office phone and fax number?  Send note in epic or send form faxed to [facility] fax

## 2017-02-11 ENCOUNTER — Ambulatory Visit
Admission: RE | Admit: 2017-02-11 | Discharge: 2017-02-11 | Disposition: A | Discharge status: Home / Self Care | Payer: Medicare Other | Source: Ambulatory Visit | Attending: Radiation Oncology | Admitting: Radiation Oncology

## 2017-02-11 DIAGNOSIS — C8332 Diffuse large B-cell lymphoma, intrathoracic lymph nodes: Secondary | ICD-10-CM | POA: Diagnosis not present

## 2017-02-11 DIAGNOSIS — C8519 Unspecified B-cell lymphoma, extranodal and solid organ sites: Secondary | ICD-10-CM | POA: Diagnosis not present

## 2017-02-11 DIAGNOSIS — C383 Malignant neoplasm of mediastinum, part unspecified: Secondary | ICD-10-CM | POA: Diagnosis not present

## 2017-02-11 DIAGNOSIS — Z510 Encounter for antineoplastic radiation therapy: Secondary | ICD-10-CM | POA: Diagnosis not present

## 2017-02-11 NOTE — Telephone Encounter (Signed)
Forwarded message to Dr.Gudena

## 2017-02-12 ENCOUNTER — Ambulatory Visit
Admission: RE | Admit: 2017-02-12 | Discharge: 2017-02-12 | Disposition: A | Discharge status: Home / Self Care | Payer: Medicare Other | Source: Ambulatory Visit | Attending: Radiation Oncology | Admitting: Radiation Oncology

## 2017-02-12 DIAGNOSIS — Z510 Encounter for antineoplastic radiation therapy: Secondary | ICD-10-CM | POA: Diagnosis not present

## 2017-02-12 DIAGNOSIS — C73 Malignant neoplasm of thyroid gland: Secondary | ICD-10-CM | POA: Diagnosis not present

## 2017-02-12 DIAGNOSIS — C383 Malignant neoplasm of mediastinum, part unspecified: Secondary | ICD-10-CM | POA: Diagnosis not present

## 2017-02-12 DIAGNOSIS — Z6837 Body mass index (BMI) 37.0-37.9, adult: Secondary | ICD-10-CM | POA: Diagnosis not present

## 2017-02-12 DIAGNOSIS — Z1389 Encounter for screening for other disorder: Secondary | ICD-10-CM | POA: Diagnosis not present

## 2017-02-12 DIAGNOSIS — I480 Paroxysmal atrial fibrillation: Secondary | ICD-10-CM | POA: Diagnosis not present

## 2017-02-12 DIAGNOSIS — C8332 Diffuse large B-cell lymphoma, intrathoracic lymph nodes: Secondary | ICD-10-CM | POA: Diagnosis not present

## 2017-02-12 DIAGNOSIS — C884 Extranodal marginal zone B-cell lymphoma of mucosa-associated lymphoid tissue [MALT-lymphoma]: Secondary | ICD-10-CM | POA: Diagnosis not present

## 2017-02-12 DIAGNOSIS — N200 Calculus of kidney: Secondary | ICD-10-CM | POA: Diagnosis not present

## 2017-02-12 DIAGNOSIS — E890 Postprocedural hypothyroidism: Secondary | ICD-10-CM | POA: Diagnosis not present

## 2017-02-12 DIAGNOSIS — I871 Compression of vein: Secondary | ICD-10-CM | POA: Diagnosis not present

## 2017-02-12 DIAGNOSIS — R7301 Impaired fasting glucose: Secondary | ICD-10-CM | POA: Diagnosis not present

## 2017-02-12 DIAGNOSIS — C50911 Malignant neoplasm of unspecified site of right female breast: Secondary | ICD-10-CM | POA: Diagnosis not present

## 2017-02-12 DIAGNOSIS — E784 Other hyperlipidemia: Secondary | ICD-10-CM | POA: Diagnosis not present

## 2017-02-12 DIAGNOSIS — R51 Headache: Secondary | ICD-10-CM | POA: Diagnosis not present

## 2017-02-13 ENCOUNTER — Ambulatory Visit
Admission: RE | Admit: 2017-02-13 | Discharge: 2017-02-13 | Disposition: A | Discharge status: Home / Self Care | Payer: Medicare Other | Source: Ambulatory Visit | Attending: Radiation Oncology | Admitting: Radiation Oncology

## 2017-02-13 ENCOUNTER — Ambulatory Visit (INDEPENDENT_AMBULATORY_CARE_PROVIDER_SITE_OTHER): Payer: Medicare Other | Admitting: *Deleted

## 2017-02-13 ENCOUNTER — Encounter: Payer: Self-pay | Admitting: Radiation Oncology

## 2017-02-13 ENCOUNTER — Encounter: Payer: Self-pay | Admitting: Hematology and Oncology

## 2017-02-13 ENCOUNTER — Ambulatory Visit (HOSPITAL_BASED_OUTPATIENT_CLINIC_OR_DEPARTMENT_OTHER): Payer: Medicare Other | Admitting: Hematology and Oncology

## 2017-02-13 ENCOUNTER — Telehealth: Payer: Self-pay

## 2017-02-13 ENCOUNTER — Other Ambulatory Visit: Payer: Self-pay | Admitting: *Deleted

## 2017-02-13 DIAGNOSIS — C8332 Diffuse large B-cell lymphoma, intrathoracic lymph nodes: Secondary | ICD-10-CM | POA: Diagnosis not present

## 2017-02-13 DIAGNOSIS — C8522 Mediastinal (thymic) large B-cell lymphoma, intrathoracic lymph nodes: Secondary | ICD-10-CM | POA: Diagnosis not present

## 2017-02-13 DIAGNOSIS — Z5181 Encounter for therapeutic drug level monitoring: Secondary | ICD-10-CM | POA: Diagnosis not present

## 2017-02-13 DIAGNOSIS — Z510 Encounter for antineoplastic radiation therapy: Secondary | ICD-10-CM | POA: Diagnosis not present

## 2017-02-13 DIAGNOSIS — I4891 Unspecified atrial fibrillation: Secondary | ICD-10-CM | POA: Diagnosis not present

## 2017-02-13 DIAGNOSIS — I6350 Cerebral infarction due to unspecified occlusion or stenosis of unspecified cerebral artery: Secondary | ICD-10-CM | POA: Diagnosis not present

## 2017-02-13 DIAGNOSIS — Z7901 Long term (current) use of anticoagulants: Secondary | ICD-10-CM | POA: Diagnosis not present

## 2017-02-13 DIAGNOSIS — C383 Malignant neoplasm of mediastinum, part unspecified: Secondary | ICD-10-CM | POA: Diagnosis not present

## 2017-02-13 DIAGNOSIS — C8592 Non-Hodgkin lymphoma, unspecified, intrathoracic lymph nodes: Secondary | ICD-10-CM | POA: Insufficient documentation

## 2017-02-13 DIAGNOSIS — C8590 Non-Hodgkin lymphoma, unspecified, unspecified site: Secondary | ICD-10-CM | POA: Insufficient documentation

## 2017-02-13 LAB — POCT INR: INR: 2.1

## 2017-02-13 MED ORDER — ENOXAPARIN SODIUM 150 MG/ML ~~LOC~~ SOLN: 150.0000 mg | SUBCUTANEOUS | 1 refills | Status: DC

## 2017-02-13 NOTE — Patient Instructions (Signed)
02/15/2017 last day to take coumadin  02/16/2017 no coumadin no Lovenox  02/17/2017 no coumadin Lovenox 150 mg daily at 8pm 02/18/2017 no coumadin Lovenox 150mg  daily at 8pm 02/19/2017 no coumadin Lovenox 150 mg daily at 8pm 02/20/2017 no coumadin no Lovenox  02/21/2017 no coumadin no Lovenox day of procedure  02/22/2017 no coumadin Lovenox 150mg  at 8am 02/23/2017 no coumadin Lovenox 150 mg at 8am 06/04 2018 no coumadin no Lovenox day of procedure  02/25/2017 Resume coumadin at regular dose of coumadin 5mg  daily except 2.5mg  on Tuesday Thursday and Saturday and resume Lovenox 150 mg daily at 8am and continue lovenox and coumadin until seen in office on June11th

## 2017-02-13 NOTE — Assessment & Plan Note (Addendum)
02/03/2017: Large superior mediastinal mass contiguous with right hilar region encasing the trachea and right mainstem bronchus measuring 5.4 x 7.5 x 6 cm causing SVC obstruction, additional mediastinal lymph nodes subcarinal 1.6 cm and left hilar 1 cm; multiple small pulmonary nodules 4-6 mm   Bronchoscopy and biopsy 02/09/2017: Monoclonal B-cell population expressing CD10 consistent with non-Hodgkin's B-cell lymphoma germinal center origin.  Pathology review: I discussed with the patient the differential diagnosis of lymphomas and the significance of non-Hodgkin's B-cell lymphoma. With CD10 being positive. Phenotype of Abnormal Cells: CD10, CD19, CD20, CD21, CD22, HLA-Dr, Lambda  Treatment summary: Palliative radiation for SVC syndrome  Plan: 1. PET/CT scan 2. blood work for hepatitis viral testing negative patient of requiring rituximab. 3. Port placement 4. Echocardiogram due to consideration for Adriamycin 5. Systemic chemotherapy with R CHOP 6 cycles to start within a week.  Chemotherapy counseling: I discussed the risks and benefits of chemotherapy including rituximab related plus infusion reactions as well as tumor lysis syndrome. Chemotherapy related nausea vomiting, hair loss, neuropathy risks as well as cardiac toxicity of Adriamycin. We also discussed the risk of decreased blood counts including the risk of infection, fatigue, loss of taste, anemia, as well as even life-threatening complications or death.  Return to clinic in one week to start chemotherapy.

## 2017-02-13 NOTE — Telephone Encounter (Signed)
Called pt to let her know of CT biopsy, pet scan and port placement dates. Pt given instructions for each procedure. Pt verbalized understanding and confirmed all time/dates and instructions for each test. No further questions at this time.

## 2017-02-13 NOTE — Progress Notes (Signed)
[place] CONSULT NOTE  Patient Care Team: [name], MD as PCP - General (Endocrinology)  CHIEF COMPLAINTS/PURPOSE OF CONSULTATION:  Newly diagnosed non-Hodgkin's lymphoma  HISTORY OF PRESENTING ILLNESS:  Kayla Harrison 71 y.o. female is here because of recent diagnosis of non-Hodgkin's lymphoma. Patient was in the hospital from 514 to 02/08/2017 with the mediastinal mass measuring 7.5 cm that was compressing the superior vena cava as well as extending into the hilum. She was started on radiation therapy soon after. The tumor was also invading the trachea. She originally presented with headache, dyspnea and facial swelling. She underwent bronchoscopy and biopsy which came back as non-Hodgkin's lymphoma. Biopsy of this lesion came back as non-Hodgkin's lymphoma B-cell type. Further analysis could not be done because it was FNA. Her breathing has improved sufficiently but still has significant difficulties from it. She cannot lie down on the bed.  I reviewed her records extensively and collaborated the history with the patient.  SUMMARY OF ONCOLOGIC HISTORY:   Non-Hodgkin lymphoma, unspecified, intrathoracic lymph nodes ([place])   02/03/2017 Imaging    Large superior mediastinal mass contiguous with right hilar region encasing the trachea and right mainstem bronchus measuring 5.4 x 7.5 x 6 cm causing SVC obstruction, additional mediastinal lymph nodes subcarinal 1.6 cm and left hilar 1 cm; multiple small pulmonary nodules 4-6 mm       02/11/2017 Initial Diagnosis    Flow cytometry of mediastinal mass revealed monoclonal population of B cells with expression of CD10 consistent with non-Hodgkin B-cell lymphoma germinal center operation       MEDICAL HISTORY:  Past Medical History:  Diagnosis Date  . Arthritis    osteoarthritis - knees   . Atrial fibrillation [facility])    ablation- 2x's-- 1st time- [facility], 2nd event at [facility] in 2008. Convergent ablation at [facility] 6/14   . Breast cancer ([place])    Dr Margot Chimes, total thyroidectomy- 1999- for cancer  . Colon polyp    Dr Earlean Shawl  . Complication of anesthesia    Ketamine produces LSD reaction, bright colored nightmarish experience   . Dyslipidemia   . Dysrhythmia    a fib with internal/external ablation, currently NSR  . Hepatitis    [place]patitis   . Hx of thyroid cancer    Dr Forde Dandy  . Hyperlipidemia   . Hypothyroidism   . Morbid obesity ([place])    Status post lap band surgery  . Normal cardiac stress test    2008- cardiac stress/echo  . Pacemaker-[employer]   . Pneumonia 2010   [facility]- during that event had to be cardioverted   . Renal calculi   . Sinus node dysfunction ([place])    Complicating convergent ablation 6/14  . Stroke [facility])    2003- [place]     SURGICAL HISTORY: Past Surgical History:  Procedure Laterality Date  . ABDOMINAL HYSTERECTOMY  1983  . afib ablation     X 2; [facility] & Dr Caryl Comes  . APPENDECTOMY    . BREAST LUMPECTOMY  2010  . bso  1998  . CARDIOVERSION  10/09/2012   Procedure: CARDIOVERSION;  Surgeon: Minus Breeding, MD;  Location: [place];  Service: Cardiovascular;  Laterality: N/A;  . CARDIOVERSION  10/09/2012   Procedure: CARDIOVERSION;  Surgeon: Minus Breeding, MD;  Location: [facility] ENDOSCOPY;  Service: Cardiovascular;  Laterality: N/A;  [name] gave the ok to add pt to the add on , but we must check to find out if  the can add pt on at [phone] ( [phone])  . CARDIOVERSION N/A 11/20/2012   Procedure: CARDIOVERSION;  Surgeon: Fay Records, MD;  Location: [facility] ENDOSCOPY;  Service: Cardiovascular;  Laterality: N/A;  . CHOLECYSTECTOMY    . COLONOSCOPY W/ POLYPECTOMY     Dr Earlean Shawl  . convergent    . CYSTOSCOPY N/A 02/06/2015   Procedure: CYSTOSCOPY;  Surgeon: Kathie Rhodes, MD;  Location: [facility] ORS;  Service: Urology;  Laterality: N/A;  . CYSTOSCOPY WITH RETROGRADE PYELOGRAM, URETEROSCOPY AND STENT PLACEMENT Right 02/06/2015   Procedure: RETROGRADE PYELOGRAM, RIGHT  URETEROSCOPY STENT PLACEMENT;  Surgeon: Kathie Rhodes, MD;  Location: [facility] ORS;  Service: Urology;  Laterality: Right;  . EYE SURGERY     cataract surgery  . HOLMIUM LASER APPLICATION N/A 04/04/4579   Procedure: HOLMIUM LASER APPLICATION;  Surgeon: Kathie Rhodes, MD;  Location: [facility] ORS;  Service: Urology;  Laterality: N/A;  . JOINT REPLACEMENT  04/13/12   Right Knee replacement  . KNEE ARTHROSCOPY     bilateral  . LAPAROSCOPIC GASTRIC BANDING  07/10/2010  . LAPAROTOMY     for ruptured ovary and ovarian artery   . PACEMAKER INSERTION  03/10/13   [facility]  . POCKET REVISION N/A 12/08/2013   Procedure: POCKET REVISION;  Surgeon: Deboraha Sprang, MD;  Location[facility] CATH LAB;  Service: Cardiovascular;  Laterality: N/A;  . THYROIDECTOMY  1998   Dr Margot Chimes  . TONSILLECTOMY    . TOTAL KNEE ARTHROPLASTY  04/13/2012   Procedure: TOTAL KNEE ARTHROPLASTY;  Surgeon: Rudean Haskell, MD;  Location: [place];  Service: Orthopedics;  Laterality: Right;  . Transabdominal ablation  03/02/13   [facility]  . VIDEO BRONCHOSCOPY WITH ENDOBRONCHIAL ULTRASOUND N/A 02/07/2017   Procedure: VIDEO BRONCHOSCOPY WITH ENDOBRONCHIAL ULTRASOUND;  Surgeon: Marshell Garfinkel, MD;  Location: [place];  Service: Pulmonary;  Laterality: N/A;    SOCIAL HISTORY: Social History   Social History  . Marital status: Single    Spouse name: N/A  . Number of children: 0  . Years of education: N/A   Occupational History  . [employer] Retired   Social History Main Topics  . Smoking status: Never Smoker  . Smokeless tobacco: Never Used  . Alcohol use No  . Drug use: No  . Sexual activity: Not on file   Other Topics Concern  . Not on file   Social History Narrative   SINGLE    NO CHILDREN   NEVER SMOKED   EXERCISE 3 X WK          FAMILY HISTORY: Family History  Problem Relation Age of Onset  . Cancer Father        COLON  . Heart disease Father 59       MI _0   . Diabetes Sister   . Diabetes  Brother   . Diabetes Paternal Aunt   . Diabetes Paternal Grandmother     ALLERGIES:  is allergic to tikosyn [dofetilide]; xarelto [rivaroxaban]; epinephrine; ketamine; and eliquis [apixaban].  MEDICATIONS:  Current Outpatient Prescriptions  Medication Sig Dispense Refill  . amiodarone (PACERONE) 200 MG tablet Take 2 tablets (400 mg total) by mouth 2 (two) times daily. 60 tablet 0  . bisoprolol (ZEBETA) 5 MG tablet Take 1/2 tablet (2.5 mg) by mouth once daily 45 tablet 3  . cycloSPORINE (RESTASIS) 0.05 % ophthalmic emulsion Place 1 drop into both eyes 2 (two) times daily.    Marland Kitchen enoxaparin (LOVENOX) 150 MG/ML injection Inject 1 mL (150 mg total)  into the skin daily. 6 Syringe 0  . furosemide (LASIX) 40 MG tablet Take 40 mg by mouth daily as needed for fluid.     Marland Kitchen levothyroxine (SYNTHROID, LEVOTHROID) 125 MCG tablet Take 1 tablet (125 mcg total) by mouth daily before breakfast. 30 tablet 0  . Lifitegrast (XIIDRA) 5 % SOLN Apply 1 drop to eye 2 (two) times daily.    . Multiple Vitamin (MULTIVITAMIN) tablet Take 1 tablet by mouth daily.     . traMADol (ULTRAM) 50 MG tablet Take 1-2 tablets (50-100 mg total) by mouth every 6 (six) hours as needed. 60 tablet 0  . warfarin (COUMADIN) 5 MG tablet TAKE AS DIRECTED BY  COUMADIN CLINIC 90 tablet 1   No current facility-administered medications for this visit.     REVIEW OF SYSTEMS:   Constitutional: Denies fevers, chills or abnormal night sweats Eyes: Denies blurriness of vision, double vision or watery eyes Ears, nose, mouth, throat, and face: Denies mucositis or sore throat Respiratory: Shortness of breath, engorgement of veins of the chest Cardiovascular: Denies palpitation, chest discomfort or lower extremity swelling prior heart problems, has a pacemaker and has had ablation for atrial fibrillation. Gastrointestinal:  Denies nausea, heartburn or change in bowel habits Skin: Denies abnormal skin rashes Lymphatics: Denies new lymphadenopathy  or easy bruising Neurological:Denies numbness, tingling or new weaknesses Behavioral/Psych: Mood is stable, no new changes   All other systems were reviewed with the patient and are negative.  PHYSICAL EXAMINATION: ECOG PERFORMANCE STATUS: 1 - Symptomatic but completely ambulatory  Vitals:   02/13/17 1245  Pulse: 75  Resp: 18  Temp: 98.2 F (36.8 C)   Filed Weights   02/13/17 1245  Weight: 238 lb 12.8 oz (108.3 kg)    GENERAL:alert, no distress and comfortable SKIN: skin color, texture, turgor are normal, no rashes or significant lesions EYES: normal, conjunctiva are pink and non-injected, sclera clear OROPHARYNX:no exudate, no erythema and lips, buccal mucosa, and tongue normal  NECK: supple, thyroid normal size, non-tender, without nodularity LYMPH:  no palpable lymphadenopathy in the cervical, axillary or inguinal LUNGS: Engorgement of chest wall veins; clear to auscultation and percussion with normal breathing effort HEART: regular rate & rhythm and no murmurs and no lower extremity edema ABDOMEN:abdomen soft, non-tender and normal bowel sounds Musculoskeletal:no cyanosis of digits and no clubbing  PSYCH: alert & oriented x 3 with fluent speech NEURO: no focal motor/sensory deficits  LABORATORY DATA:  I have reviewed the data as listed Lab Results  Component Value Date   WBC 7.1 02/08/2017   HGB 11.4 (L) 02/08/2017   HCT 36.9 02/08/2017   MCV 90.9 02/08/2017   PLT 186 02/08/2017   Lab Results  Component Value Date   NA 138 02/08/2017   K 4.1 02/08/2017   CL 106 02/08/2017   CO2 24 02/08/2017    RADIOGRAPHIC STUDIES: I have personally reviewed the radiological reports and agreed with the findings in the report.  ASSESSMENT AND PLAN:  Non-Hodgkin lymphoma, unspecified, intrathoracic lymph nodes ([place]) 02/03/2017: Large superior mediastinal mass contiguous with right hilar region encasing the trachea and right mainstem bronchus measuring 5.4 x 7.5 x 6 cm  causing SVC obstruction, additional mediastinal lymph nodes subcarinal 1.6 cm and left hilar 1 cm; multiple small pulmonary nodules 4-6 mm   Bronchoscopy and biopsy 02/09/2017: Monoclonal B-cell population expressing CD10 consistent with non-Hodgkin's B-cell lymphoma germinal center origin.  Pathology review: I discussed with the patient the differential diagnosis of lymphomas and the significance of non-Hodgkin's  B-cell lymphoma. With CD10 being positive. Phenotype of Abnormal Cells: CD10, CD19, CD20, CD21, CD22, HLA-Dr, Lambda  Treatment summary: Palliative radiation for SVC syndrome  Plan: 1. PET/CT scan 2. blood work for hepatitis viral testing negative patient of requiring rituximab. 3. Port placement And bone marrow biopsy 4. Echocardiogram due to consideration for Adriamycin: Done on 02/05/2017: EF 60-65% 5. Systemic chemotherapy with R CHOP 6 cycles to start within a week.  Chemotherapy counseling: I discussed the risks and benefits of chemotherapy including rituximab related plus infusion reactions as well as tumor lysis syndrome. Chemotherapy related nausea vomiting, hair loss, neuropathy risks as well as cardiac toxicity of Adriamycin. We also discussed the risk of decreased blood counts including the risk of infection, fatigue, loss of taste, anemia, as well as even life-threatening complications or death.  Ideally would like to get a full lymph node biopsy. Once the PET scan is done we will have to determine if she needs such a biopsy. If no such biopsy can be obtained then we will treat her as aggressive  B-cell non-Hodgkin's lymphoma with R CHOP.   All questions were answered. The patient knows to call the clinic with any problems, questions or concerns.    Rulon Eisenmenger, MD 02/13/17

## 2017-02-13 NOTE — Progress Notes (Signed)
The patient's pathology returned positive for lymphoma, favoring follicular lymphoma. This does not appear to represent a high-grade follicular lymphoma. We have begun treating the patient to the tumor within the mediastinum causing SVC syndrome. Given the size and location of this tumor in conjunction with her biopsy results, with the tumor is encasing the major central airways, I believe it is reasonable to continue with a course of radiation treatment for approximately 2-3 weeks. I would expect the tumor to be quite radiosensitive and hopefully we can achieve a nice response. She is scheduled to see medical oncology as well to evaluate for possible systemic treatment options.

## 2017-02-14 ENCOUNTER — Other Ambulatory Visit: Payer: Self-pay | Admitting: Radiation Oncology

## 2017-02-14 ENCOUNTER — Ambulatory Visit
Admission: RE | Admit: 2017-02-14 | Discharge: 2017-02-14 | Disposition: A | Discharge status: Home / Self Care | Payer: Medicare Other | Source: Ambulatory Visit | Attending: Radiation Oncology | Admitting: Radiation Oncology

## 2017-02-14 DIAGNOSIS — C8332 Diffuse large B-cell lymphoma, intrathoracic lymph nodes: Secondary | ICD-10-CM | POA: Diagnosis not present

## 2017-02-14 DIAGNOSIS — Z510 Encounter for antineoplastic radiation therapy: Secondary | ICD-10-CM | POA: Diagnosis not present

## 2017-02-14 DIAGNOSIS — C383 Malignant neoplasm of mediastinum, part unspecified: Secondary | ICD-10-CM | POA: Diagnosis not present

## 2017-02-14 MED ORDER — SUCRALFATE 1 G PO TABS: 1.0000 g | ORAL_TABLET | Freq: Four times a day (QID) | ORAL | 2 refills | Status: DC

## 2017-02-18 ENCOUNTER — Ambulatory Visit
Admission: RE | Admit: 2017-02-18 | Discharge: 2017-02-18 | Disposition: A | Discharge status: Home / Self Care | Payer: Medicare Other | Source: Ambulatory Visit | Attending: Radiation Oncology | Admitting: Radiation Oncology

## 2017-02-18 ENCOUNTER — Encounter: Payer: Self-pay | Admitting: *Deleted

## 2017-02-18 ENCOUNTER — Ambulatory Visit (HOSPITAL_COMMUNITY)
Admission: RE | Admit: 2017-02-18 | Discharge: 2017-02-18 | Disposition: A | Discharge status: Home / Self Care | Payer: Medicare Other | Source: Ambulatory Visit | Attending: Nurse Practitioner | Admitting: Nurse Practitioner

## 2017-02-18 ENCOUNTER — Other Ambulatory Visit: Payer: Medicare Other

## 2017-02-18 ENCOUNTER — Encounter (HOSPITAL_COMMUNITY): Payer: Self-pay | Admitting: Nurse Practitioner

## 2017-02-18 ENCOUNTER — Other Ambulatory Visit: Payer: Self-pay

## 2017-02-18 VITALS — BP 106/58 | HR 76 | Ht 66.0 in | Wt 236.4 lb

## 2017-02-18 DIAGNOSIS — C8510 Unspecified B-cell lymphoma, unspecified site: Secondary | ICD-10-CM | POA: Insufficient documentation

## 2017-02-18 DIAGNOSIS — C383 Malignant neoplasm of mediastinum, part unspecified: Secondary | ICD-10-CM | POA: Diagnosis not present

## 2017-02-18 DIAGNOSIS — Z7901 Long term (current) use of anticoagulants: Secondary | ICD-10-CM | POA: Diagnosis not present

## 2017-02-18 DIAGNOSIS — I700 Atherosclerosis of aorta: Secondary | ICD-10-CM | POA: Insufficient documentation

## 2017-02-18 DIAGNOSIS — I2510 Atherosclerotic heart disease of native coronary artery without angina pectoris: Secondary | ICD-10-CM | POA: Diagnosis not present

## 2017-02-18 DIAGNOSIS — I4819 Other persistent atrial fibrillation: Secondary | ICD-10-CM

## 2017-02-18 DIAGNOSIS — N200 Calculus of kidney: Secondary | ICD-10-CM | POA: Diagnosis not present

## 2017-02-18 DIAGNOSIS — I481 Persistent atrial fibrillation: Secondary | ICD-10-CM | POA: Insufficient documentation

## 2017-02-18 DIAGNOSIS — Z510 Encounter for antineoplastic radiation therapy: Secondary | ICD-10-CM | POA: Diagnosis not present

## 2017-02-18 DIAGNOSIS — R911 Solitary pulmonary nodule: Secondary | ICD-10-CM | POA: Diagnosis not present

## 2017-02-18 DIAGNOSIS — C8332 Diffuse large B-cell lymphoma, intrathoracic lymph nodes: Secondary | ICD-10-CM | POA: Diagnosis not present

## 2017-02-18 MED ORDER — AMIODARONE HCL 200 MG PO TABS: ORAL_TABLET | ORAL | 0 refills | Status: DC

## 2017-02-18 NOTE — Patient Instructions (Signed)
Your physician has recommended you make the following change in your medication:  1) Decrease Amiodarone -- Take 1 tablet by mouth twice a day for 1 week then reduce to 1 tablet daily

## 2017-02-19 ENCOUNTER — Ambulatory Visit (HOSPITAL_COMMUNITY)
Admission: RE | Admit: 2017-02-19 | Discharge: 2017-02-19 | Disposition: A | Discharge status: Home / Self Care | Payer: Medicare Other | Source: Ambulatory Visit | Attending: Hematology and Oncology | Admitting: Hematology and Oncology

## 2017-02-19 ENCOUNTER — Other Ambulatory Visit: Payer: Self-pay

## 2017-02-19 ENCOUNTER — Ambulatory Visit
Admission: RE | Admit: 2017-02-19 | Discharge: 2017-02-19 | Disposition: A | Discharge status: Home / Self Care | Payer: Medicare Other | Source: Ambulatory Visit | Attending: Radiation Oncology | Admitting: Radiation Oncology

## 2017-02-19 ENCOUNTER — Telehealth: Payer: Self-pay | Admitting: *Deleted

## 2017-02-19 DIAGNOSIS — Z510 Encounter for antineoplastic radiation therapy: Secondary | ICD-10-CM | POA: Diagnosis not present

## 2017-02-19 DIAGNOSIS — C8510 Unspecified B-cell lymphoma, unspecified site: Secondary | ICD-10-CM | POA: Diagnosis not present

## 2017-02-19 DIAGNOSIS — I481 Persistent atrial fibrillation: Secondary | ICD-10-CM | POA: Diagnosis not present

## 2017-02-19 DIAGNOSIS — C8522 Mediastinal (thymic) large B-cell lymphoma, intrathoracic lymph nodes: Secondary | ICD-10-CM

## 2017-02-19 DIAGNOSIS — R911 Solitary pulmonary nodule: Secondary | ICD-10-CM | POA: Diagnosis not present

## 2017-02-19 DIAGNOSIS — Z7901 Long term (current) use of anticoagulants: Secondary | ICD-10-CM | POA: Diagnosis not present

## 2017-02-19 DIAGNOSIS — I700 Atherosclerosis of aorta: Secondary | ICD-10-CM | POA: Diagnosis not present

## 2017-02-19 DIAGNOSIS — C8332 Diffuse large B-cell lymphoma, intrathoracic lymph nodes: Secondary | ICD-10-CM | POA: Diagnosis not present

## 2017-02-19 DIAGNOSIS — N200 Calculus of kidney: Secondary | ICD-10-CM | POA: Diagnosis not present

## 2017-02-19 DIAGNOSIS — C383 Malignant neoplasm of mediastinum, part unspecified: Secondary | ICD-10-CM

## 2017-02-19 LAB — GLUCOSE, CAPILLARY: Glucose-Capillary: 107 mg/dL — ABNORMAL HIGH (ref 65–99)

## 2017-02-19 MED ORDER — FLUDEOXYGLUCOSE F - 18 (FDG) INJECTION
11.6200 | Freq: Once | INTRAVENOUS | Status: AC | PRN
  Administered 2017-02-19: 11.62 via INTRAVENOUS

## 2017-02-19 NOTE — Telephone Encounter (Signed)
Will notify Dr.Gudena of results. Thank you.

## 2017-02-19 NOTE — Progress Notes (Signed)
Primary Care Physician: [name], MD Referring Physician:[facility] f/u EP: Dr. [name] is a 71 y.o. female with a h/o PPM, persistent afib in the afib clinic for f/u of recent hospitalization. She has h/o of  undergoing afib ablation at [facility] in 2009 with recurrent afib and convergent ablation at [facility] in 2014. She had been on amiodarone in the past but was stopped over a year ago since she was maintaining SR. She was recently admitted to [facility] for breathing difficulties. She was found to be in afib with rvr and also to have a new dx of a lung mass. She was reloaded on amiodarone and is here for f/u. She had been on 400 mg amiodarone bid for 10 days. She is in SR. She is being followed by oncology and has been dx with lymphoma and is undergoing radiation and is scheduled to start chemo soon.  Today, she denies symptoms of palpitations, chest pain, shortness of breath, orthopnea, PND, lower extremity edema, dizziness, presyncope, syncope, or neurologic sequela. The patient is tolerating medications without difficulties and is otherwise without complaint today.   Past Medical History:  Diagnosis Date  . Arthritis    osteoarthritis - knees   . Atrial fibrillation [facility])    ablation- 2x's-- 1st time- [facility], 2nd event at [facility] in 2008. Convergent ablation at [facility] 6/14  . Breast cancer ([place])    Dr Margot Chimes, total thyroidectomy- 1999- for cancer  . Colon polyp    Dr Earlean Shawl  . Complication of anesthesia    Ketamine produces LSD reaction, bright colored nightmarish experience   . Dyslipidemia   . Dysrhythmia    a fib with internal/external ablation, currently NSR  . Hepatitis    [place]   . Hx of thyroid cancer    Dr Forde Dandy  . Hyperlipidemia   . Hypothyroidism   . Morbid obesity ([place])    Status post lap band surgery  . Normal cardiac stress test    2008- cardiac stress/echo  . Pacemaker-Medtronic   . Pneumonia 2010   [facility]-  during that event had to be cardioverted   . Renal calculi   . Sinus node dysfunction ([place])    Complicating convergent ablation 6/14  . Stroke [facility])    2003- [place]    Past Surgical History:  Procedure Laterality Date  . ABDOMINAL HYSTERECTOMY  1983  . afib ablation     X 2; [facility] & Dr Caryl Comes  . APPENDECTOMY    . BREAST LUMPECTOMY  2010  . bso  1998  . CARDIOVERSION  10/09/2012   Procedure: CARDIOVERSION;  Surgeon: Minus Breeding, MD;  Location: [place];  Service: Cardiovascular;  Laterality: N/A;  . CARDIOVERSION  10/09/2012   Procedure: CARDIOVERSION;  Surgeon: Minus Breeding, MD;  Location: [facility] ENDOSCOPY;  Service: Cardiovascular;  Laterality: N/A;  [name] gave the ok to add pt to the add on , but we must check to find out if the can add pt on at [phone] ( [phone])  . CARDIOVERSION N/A 11/20/2012   Procedure: CARDIOVERSION;  Surgeon: Fay Records, MD;  Location: [facility] ENDOSCOPY;  Service: Cardiovascular;  Laterality: N/A;  . CHOLECYSTECTOMY    . COLONOSCOPY W/ POLYPECTOMY     Dr Earlean Shawl  . convergent    . CYSTOSCOPY N/A 02/06/2015   Procedure: CYSTOSCOPY;  Surgeon: Kathie Rhodes, MD;  [facility];  Service: Urology;  Laterality: N/A;  . CYSTOSCOPY WITH RETROGRADE  PYELOGRAM, URETEROSCOPY AND STENT PLACEMENT Right 02/06/2015   Procedure: RETROGRADE PYELOGRAM, RIGHT URETEROSCOPY STENT PLACEMENT;  Surgeon: Kathie Rhodes, MD;  [facility];  Service: Urology;  Laterality: Right;  . EYE SURGERY     cataract surgery  . HOLMIUM LASER APPLICATION N/A 7/61/6073   Procedure: HOLMIUM LASER APPLICATION;  Surgeon: Kathie Rhodes, MD;  [facility];  Service: Urology;  Laterality: N/A;  . JOINT REPLACEMENT  04/13/12   Right Knee replacement  . KNEE ARTHROSCOPY     bilateral  . LAPAROSCOPIC GASTRIC BANDING  07/10/2010  . LAPAROTOMY     for ruptured ovary and ovarian artery   . PACEMAKER INSERTION  03/10/13   [facility]  . POCKET REVISION N/A 12/08/2013   Procedure: POCKET REVISION;  Surgeon: Deboraha Sprang, MD;  Location: [facility] CATH LAB;  Service: Cardiovascular;  Laterality: N/A;  . THYROIDECTOMY  1998   Dr Margot Chimes  . TONSILLECTOMY    . TOTAL KNEE ARTHROPLASTY  04/13/2012   Procedure: TOTAL KNEE ARTHROPLASTY;  Surgeon: Rudean Haskell, MD;  Location: [place];  Service: Orthopedics;  Laterality: Right;  . Transabdominal ablation  03/02/13   [facility]  . VIDEO BRONCHOSCOPY WITH ENDOBRONCHIAL ULTRASOUND N/A 02/07/2017   Procedure: VIDEO BRONCHOSCOPY WITH ENDOBRONCHIAL ULTRASOUND;  Surgeon: Marshell Garfinkel, MD;  Location: [place];  Service: Pulmonary;  Laterality: N/A;    Current Outpatient Prescriptions  Medication Sig Dispense Refill  . amiodarone (PACERONE) 200 MG tablet Take 1 tablet by mouth twice a day for 1 week then reduce to 1 tablet daily 60 tablet 0  . cycloSPORINE (RESTASIS) 0.05 % ophthalmic emulsion Place 1 drop into both eyes 2 (two) times daily.    Marland Kitchen enoxaparin (LOVENOX) 150 MG/ML injection Inject 1 mL (150 mg total) into the skin daily. 20 Syringe 1  . levothyroxine (SYNTHROID, LEVOTHROID) 125 MCG tablet Take 1 tablet (125 mcg total) by mouth daily before breakfast. 30 tablet 0  . Lifitegrast (XIIDRA) 5 % SOLN Apply 1 drop to eye 2 (two) times daily.    . Multiple Vitamin (MULTIVITAMIN) tablet Take 1 tablet by mouth daily.     . sucralfate (CARAFATE) 1 g tablet Take 1 tablet (1 g total) by mouth 4 (four) times daily. 120 tablet 2  . warfarin (COUMADIN) 5 MG tablet TAKE AS DIRECTED BY  COUMADIN CLINIC 90 tablet 1  . furosemide (LASIX) 40 MG tablet Take 40 mg by mouth daily as needed for fluid.     . traMADol (ULTRAM) 50 MG tablet Take 1-2 tablets (50-100 mg total) by mouth every 6 (six) hours as needed. (Patient not taking: Reported on 02/18/2017) 60 tablet 0   No current facility-administered medications for this encounter.     Allergies  Allergen Reactions  . Tikosyn [Dofetilide]     Prolonged QT interval Prolonged QT interval  . Xarelto [Rivaroxaban]     Nose Bleed X 6 hrs ;  packing in ER  . Epinephrine Other (See Comments) and Rash    Dental form only (liquid). Patient stated she will become "out of it" she can hear you but cannot respond in normal fashion.  Marland Kitchen Ketamine     Hallucinations  . Eliquis [Apixaban] Other (See Comments)    BLEEDING IN URINE    Social History   Social History  . Marital status: Single    Spouse name: N/A  . Number of children: 0  . Years of education: N/A   Occupational History  . [employer]  Retired   Social History Main Topics  . Smoking status: Never Smoker  . Smokeless tobacco: Never Used  . Alcohol use No  . Drug use: No  . Sexual activity: Not on file   Other Topics Concern  . Not on file   Social History Narrative   SINGLE    NO CHILDREN   NEVER SMOKED   EXERCISE 3 X WK          Family History  Problem Relation Age of Onset  . Cancer Father        COLON  . Heart disease Father 29       MI @autopsy   . Diabetes Sister   . Diabetes Brother   . Diabetes Paternal Aunt   . Diabetes Paternal Grandmother     ROS- All systems are reviewed and negative except as per the HPI above  Physical Exam: Vitals:   02/18/17 1532  BP: (!) 106/58  Pulse: 76  Weight: 236 lb 6.4 oz (107.2 kg)  Height: 5\' 6"  (1.676 m)   Wt Readings from Last 3 Encounters:  02/18/17 236 lb 6.4 oz (107.2 kg)  02/13/17 238 lb 12.8 oz (108.3 kg)  02/03/17 224 lb 8 oz (101.8 kg)    Labs: Lab Results  Component Value Date   NA 138 02/08/2017   K 4.1 02/08/2017   CL 106 02/08/2017   CO2 24 02/08/2017   GLUCOSE 115 (H) 02/08/2017   BUN 15 02/08/2017   CREATININE 1.01 (H) 02/08/2017   CALCIUM 8.9 02/08/2017   PHOS 3.9 04/19/2013   MG 2.1 04/19/2013   Lab Results  Component Value Date   INR 2.1 02/13/2017   Lab Results  Component Value Date   CHOL 200 07/26/2013   HDL 51.80 07/26/2013   LDLCALC 116 (H) 07/26/2013   TRIG 162.0 (H) 07/26/2013     GEN- The patient is well appearing,  alert and oriented x 3 today.   Head- normocephalic, atraumatic Eyes-  Sclera clear, conjunctiva pink Ears- hearing intact Oropharynx- clear Neck- supple, no JVP Lymph- no cervical lymphadenopathy Lungs- Clear to ausculation bilaterally, normal work of breathing Heart- Regular rate and rhythm, no murmurs, rubs or gallops, PMI not laterally displaced GI- soft, NT, ND, + BS Extremities- no clubbing, cyanosis, or edema MS- no significant deformity or atrophy Skin- no rash or lesion Psych- euthymic mood, full affect Neuro- strength and sensation are intact  EKG-NSR at 76 bpm, pr int 186 ms, qrs int 84 ms, qtc 447 ms Epic records reviewed    Assessment and Plan: 1. Persistent afib Loading on amiodarone and is in rhythm Decrease amiodarone to   200 mg bid x one week and then 200 mg a day Inform coumadin clinic that amiodarone dose is being changed Continue coumadin with chadsvasc score of at least 2  2. Lymphoma/lung mass Per oncology  F/u in 2 weeks in afib clinic and  6 weeks with Dr. Norma Fredrickson C. Kayla Harrison, [facility] [address] [place], [place] [address] [phone]

## 2017-02-19 NOTE — Progress Notes (Signed)
Dr.Gudena reviewed pet scan from yesterday. No new tumors found, but did find new kidney stones that needs attention. Dr.Gudena would like for pt to see a urologist. Called pt to let her know of results, per Dr.Gudena. Pt states that she sees Dr. Karsten Ro at [facility]. Will call his office and fax over pt results. Pt aware and verbalized understanding.

## 2017-02-19 NOTE — Telephone Encounter (Signed)
"  Erline Levine with [facility] calling report of today's PET scan.  Incidental finding noted with Impression number 2."  IMPRESSION: 1. Hypermetabolic right paratracheal nodal mass, consistent with the given history of lymphoma. No evidence of hypermetabolic extrathoracic lymphoma. 2. Moderate to severe right hydronephrosis with stones at the right ureteral pelvic junction (chronic) and proximal right ureter (new). These results will be called to the ordering clinician or representative by the Radiologist Assistant, and communication documented in the PACS or zVision Dashboard. 3. 7 mm left lower lobe nodule, too small for PET resolution. 4. Left renal stones. 5. Aortic atherosclerosis (ICD10-170.0). Coronary artery calcification.   Electronically Signed   By: Lorin Picket M.D.   On: 02/19/2017 09:31   Next scheduled F/U is 03-05-2017.

## 2017-02-20 ENCOUNTER — Ambulatory Visit
Admission: RE | Admit: 2017-02-20 | Discharge: 2017-02-20 | Disposition: A | Discharge status: Home / Self Care | Payer: Medicare Other | Source: Ambulatory Visit | Attending: Radiation Oncology | Admitting: Radiation Oncology

## 2017-02-20 ENCOUNTER — Other Ambulatory Visit: Payer: Self-pay | Admitting: Radiology

## 2017-02-20 ENCOUNTER — Telehealth: Payer: Self-pay | Admitting: Internal Medicine

## 2017-02-20 DIAGNOSIS — Z510 Encounter for antineoplastic radiation therapy: Secondary | ICD-10-CM | POA: Diagnosis not present

## 2017-02-20 DIAGNOSIS — C383 Malignant neoplasm of mediastinum, part unspecified: Secondary | ICD-10-CM | POA: Diagnosis not present

## 2017-02-20 DIAGNOSIS — N202 Calculus of kidney with calculus of ureter: Secondary | ICD-10-CM | POA: Diagnosis not present

## 2017-02-20 DIAGNOSIS — C8332 Diffuse large B-cell lymphoma, intrathoracic lymph nodes: Secondary | ICD-10-CM | POA: Diagnosis not present

## 2017-02-20 DIAGNOSIS — N132 Hydronephrosis with renal and ureteral calculous obstruction: Secondary | ICD-10-CM | POA: Diagnosis not present

## 2017-02-20 NOTE — Telephone Encounter (Signed)
Will forward to Dr. Caryl Comes for clearance and to pharm D for warfarin instructions.

## 2017-02-20 NOTE — Telephone Encounter (Signed)
Pt on warfarin for Afib with history of CVA and will need lovenox bridge while coumadin on hold per protocol. Note added to upcoming coumadin visit.

## 2017-02-20 NOTE — Telephone Encounter (Signed)
New message       Request for surgical clearance:  What type of surgery is being performed?  Kidney stone-----ureteroscopy 1. When is this surgery scheduled?  Pending clearance  2. Are there any medications that need to be held prior to surgery and how long? Hold coumadin 5 days prior  Name of physician performing surgery?  Dr Karsten Ro 3. What is your office phone and fax number?  Fax [phone]

## 2017-02-21 ENCOUNTER — Encounter (HOSPITAL_COMMUNITY): Payer: Self-pay

## 2017-02-21 ENCOUNTER — Other Ambulatory Visit: Payer: Self-pay | Admitting: Hematology and Oncology

## 2017-02-21 ENCOUNTER — Ambulatory Visit (HOSPITAL_COMMUNITY)
Admission: RE | Admit: 2017-02-21 | Discharge: 2017-02-21 | Disposition: A | Discharge status: Home / Self Care | Payer: Medicare Other | Source: Ambulatory Visit | Attending: Hematology and Oncology | Admitting: Hematology and Oncology

## 2017-02-21 ENCOUNTER — Other Ambulatory Visit (HOSPITAL_COMMUNITY): Payer: Medicare Other

## 2017-02-21 ENCOUNTER — Ambulatory Visit
Admission: RE | Admit: 2017-02-21 | Discharge: 2017-02-21 | Disposition: A | Discharge status: Home / Self Care | Payer: Medicare Other | Source: Ambulatory Visit | Attending: Radiation Oncology | Admitting: Radiation Oncology

## 2017-02-21 ENCOUNTER — Other Ambulatory Visit: Payer: Self-pay | Admitting: General Surgery

## 2017-02-21 ENCOUNTER — Other Ambulatory Visit: Payer: Self-pay | Admitting: Radiology

## 2017-02-21 DIAGNOSIS — Z90710 Acquired absence of both cervix and uterus: Secondary | ICD-10-CM | POA: Insufficient documentation

## 2017-02-21 DIAGNOSIS — I4891 Unspecified atrial fibrillation: Secondary | ICD-10-CM | POA: Insufficient documentation

## 2017-02-21 DIAGNOSIS — Z510 Encounter for antineoplastic radiation therapy: Secondary | ICD-10-CM | POA: Diagnosis not present

## 2017-02-21 DIAGNOSIS — M1990 Unspecified osteoarthritis, unspecified site: Secondary | ICD-10-CM | POA: Diagnosis not present

## 2017-02-21 DIAGNOSIS — E785 Hyperlipidemia, unspecified: Secondary | ICD-10-CM | POA: Insufficient documentation

## 2017-02-21 DIAGNOSIS — Z8249 Family history of ischemic heart disease and other diseases of the circulatory system: Secondary | ICD-10-CM | POA: Diagnosis not present

## 2017-02-21 DIAGNOSIS — Z79899 Other long term (current) drug therapy: Secondary | ICD-10-CM | POA: Insufficient documentation

## 2017-02-21 DIAGNOSIS — Z853 Personal history of malignant neoplasm of breast: Secondary | ICD-10-CM | POA: Insufficient documentation

## 2017-02-21 DIAGNOSIS — C8522 Mediastinal (thymic) large B-cell lymphoma, intrathoracic lymph nodes: Secondary | ICD-10-CM | POA: Diagnosis not present

## 2017-02-21 DIAGNOSIS — E039 Hypothyroidism, unspecified: Secondary | ICD-10-CM | POA: Insufficient documentation

## 2017-02-21 DIAGNOSIS — Z9049 Acquired absence of other specified parts of digestive tract: Secondary | ICD-10-CM | POA: Diagnosis not present

## 2017-02-21 DIAGNOSIS — Z9889 Other specified postprocedural states: Secondary | ICD-10-CM | POA: Insufficient documentation

## 2017-02-21 DIAGNOSIS — C8590 Non-Hodgkin lymphoma, unspecified, unspecified site: Secondary | ICD-10-CM | POA: Insufficient documentation

## 2017-02-21 DIAGNOSIS — Z833 Family history of diabetes mellitus: Secondary | ICD-10-CM | POA: Insufficient documentation

## 2017-02-21 DIAGNOSIS — Z9884 Bariatric surgery status: Secondary | ICD-10-CM | POA: Insufficient documentation

## 2017-02-21 DIAGNOSIS — Z800 Family history of malignant neoplasm of digestive organs: Secondary | ICD-10-CM | POA: Insufficient documentation

## 2017-02-21 DIAGNOSIS — C8332 Diffuse large B-cell lymphoma, intrathoracic lymph nodes: Secondary | ICD-10-CM | POA: Diagnosis not present

## 2017-02-21 DIAGNOSIS — Z85850 Personal history of malignant neoplasm of thyroid: Secondary | ICD-10-CM | POA: Insufficient documentation

## 2017-02-21 DIAGNOSIS — Z888 Allergy status to other drugs, medicaments and biological substances status: Secondary | ICD-10-CM | POA: Diagnosis not present

## 2017-02-21 DIAGNOSIS — Z950 Presence of cardiac pacemaker: Secondary | ICD-10-CM | POA: Diagnosis not present

## 2017-02-21 DIAGNOSIS — Z96651 Presence of right artificial knee joint: Secondary | ICD-10-CM | POA: Diagnosis not present

## 2017-02-21 DIAGNOSIS — Z87442 Personal history of urinary calculi: Secondary | ICD-10-CM | POA: Diagnosis not present

## 2017-02-21 DIAGNOSIS — C383 Malignant neoplasm of mediastinum, part unspecified: Secondary | ICD-10-CM | POA: Diagnosis not present

## 2017-02-21 DIAGNOSIS — Z8572 Personal history of non-Hodgkin lymphomas: Secondary | ICD-10-CM | POA: Diagnosis not present

## 2017-02-21 DIAGNOSIS — Z8673 Personal history of transient ischemic attack (TIA), and cerebral infarction without residual deficits: Secondary | ICD-10-CM | POA: Diagnosis not present

## 2017-02-21 DIAGNOSIS — D7589 Other specified diseases of blood and blood-forming organs: Secondary | ICD-10-CM | POA: Diagnosis not present

## 2017-02-21 HISTORY — PX: BONE MARROW BIOPSY: SHX199

## 2017-02-21 LAB — CBC WITH DIFFERENTIAL/PLATELET
Basophils Absolute: 0 10*3/uL (ref 0.0–0.1)
Basophils Relative: 1 %
EOS PCT: 3 %
Eosinophils Absolute: 0.2 10*3/uL (ref 0.0–0.7)
HEMATOCRIT: 38.9 % (ref 36.0–46.0)
HEMOGLOBIN: 12.7 g/dL (ref 12.0–15.0)
LYMPHS ABS: 0.5 10*3/uL — AB (ref 0.7–4.0)
LYMPHS PCT: 11 %
MCH: 29 pg (ref 26.0–34.0)
MCHC: 32.6 g/dL (ref 30.0–36.0)
MCV: 88.8 fL (ref 78.0–100.0)
MONO ABS: 0.6 10*3/uL (ref 0.1–1.0)
MONOS PCT: 12 %
Neutro Abs: 3.5 10*3/uL (ref 1.7–7.7)
Neutrophils Relative %: 73 %
Platelets: 205 10*3/uL (ref 150–400)
RBC: 4.38 MIL/uL (ref 3.87–5.11)
RDW: 14.1 % (ref 11.5–15.5)
WBC: 4.8 10*3/uL (ref 4.0–10.5)

## 2017-02-21 LAB — PROTIME-INR
INR: 1.11
Prothrombin Time: 14.3 seconds (ref 11.4–15.2)

## 2017-02-21 MED ORDER — FENTANYL CITRATE (PF) 100 MCG/2ML IJ SOLN
INTRAMUSCULAR | Status: AC | PRN
  Administered 2017-02-21 (×2): 50 ug via INTRAVENOUS

## 2017-02-21 MED ORDER — FLUMAZENIL 0.5 MG/5ML IV SOLN
INTRAVENOUS | Status: AC
  Filled 2017-02-21: qty 5

## 2017-02-21 MED ORDER — FENTANYL CITRATE (PF) 100 MCG/2ML IJ SOLN
INTRAMUSCULAR | Status: AC
  Filled 2017-02-21: qty 2

## 2017-02-21 MED ORDER — SODIUM CHLORIDE 0.9 % IV SOLN
INTRAVENOUS | Status: DC
  Administered 2017-02-21: 11:00:00 via INTRAVENOUS

## 2017-02-21 MED ORDER — MIDAZOLAM HCL 2 MG/2ML IJ SOLN
INTRAMUSCULAR | Status: AC | PRN
  Administered 2017-02-21 (×3): 1 mg via INTRAVENOUS

## 2017-02-21 MED ORDER — LIDOCAINE HCL (PF) 1 % IJ SOLN
INTRAMUSCULAR | Status: AC | PRN
  Administered 2017-02-21 (×2): 10 mL via INTRADERMAL

## 2017-02-21 MED ORDER — MIDAZOLAM HCL 2 MG/2ML IJ SOLN
INTRAMUSCULAR | Status: AC
  Filled 2017-02-21: qty 4

## 2017-02-21 MED ORDER — NALOXONE HCL 0.4 MG/ML IJ SOLN
INTRAMUSCULAR | Status: DC
Start: 2017-02-21 — End: 2017-02-21
  Filled 2017-02-21: qty 1

## 2017-02-21 NOTE — H&P (Signed)
Chief Complaint: NHL  Referring Physician:Dr. Nicholas Lose  Supervising Physician: Corrie Mckusick  Patient Status: [facility] - Out-pt  HPI: Kayla Harrison is a 71 y.o. female who has been diagnosed with NHL.  She is currently undergoing radiation to her tumor in her right chest wall.  This tumor was causing SVC syndrome.  She underwent a bronchoscopy and this is what revealed NHL B-cell, but no further analysis could be done because the sample was an FNA.  She presents today for a bone marrow biopsy to further determine characteristics of the disease process.  She normally takes coumadin, but this has been held and she has been taking lovenox.  Her last dose was 5/30.  Past Medical History:  Past Medical History:  Diagnosis Date  . Arthritis    osteoarthritis - knees   . Atrial fibrillation [facility])    ablation- 2x's-- 1st time- [facility], 2nd event at [facility] in 2008. Convergent ablation at [facility] 6/14  . Breast cancer ([place])    Dr Margot Chimes, total thyroidectomy- 1999- for cancer  . Colon polyp    Dr Earlean Shawl  . Complication of anesthesia    Ketamine produces LSD reaction, bright colored nightmarish experience   . Dyslipidemia   . Dysrhythmia    a fib with internal/external ablation, currently NSR  . Hepatitis    [place] ?[place]   . Hx of thyroid cancer    Dr Forde Dandy  . Hyperlipidemia   . Hypothyroidism   . Morbid obesity ([place])    Status post lap band surgery  . Normal cardiac stress test    2008- cardiac stress/echo  . Pacemaker-Medtronic   . Pneumonia 2010   [facility]- during that event had to be cardioverted   . Renal calculi   . Sinus node dysfunction ([place])    Complicating convergent ablation 6/14  . Stroke [facility])    2003- [place]     Past Surgical History:  Past Surgical History:  Procedure Laterality Date  . ABDOMINAL HYSTERECTOMY  1983  . afib ablation     X 2; [facility] & Dr Caryl Comes  . APPENDECTOMY    . BREAST LUMPECTOMY  2010  . bso  1998   . CARDIOVERSION  10/09/2012   Procedure: CARDIOVERSION;  Surgeon: Minus Breeding, MD;  Location: [place];  Service: Cardiovascular;  Laterality: N/A;  . CARDIOVERSION  10/09/2012   Procedure: CARDIOVERSION;  Surgeon: Minus Breeding, MD;  Location: [facility] ENDOSCOPY;  Service: Cardiovascular;  Laterality: N/A;  [name] gave the ok to add pt to the add on , but we must check to find out if the can add pt on at 1400 ( [phone])  . CARDIOVERSION N/A 11/20/2012   Procedure: CARDIOVERSION;  Surgeon: Fay Records, MD;  Location: [facility] ENDOSCOPY;  Service: Cardiovascular;  Laterality: N/A;  . CHOLECYSTECTOMY    . COLONOSCOPY W/ POLYPECTOMY     Dr Earlean Shawl  . convergent    . CYSTOSCOPY N/A 02/06/2015   Procedure: CYSTOSCOPY;  Surgeon: Kathie Rhodes, MD;  [facility];  Service: Urology;  Laterality: N/A;  . CYSTOSCOPY WITH RETROGRADE PYELOGRAM, URETEROSCOPY AND STENT PLACEMENT Right 02/06/2015   Procedure: RETROGRADE PYELOGRAM, RIGHT URETEROSCOPY STENT PLACEMENT;  Surgeon: Kathie Rhodes, MD;  [facility];  Service: Urology;  Laterality: Right;  . EYE SURGERY     cataract surgery  . HOLMIUM LASER APPLICATION N/A 05/12/6014   Procedure: HOLMIUM LASER APPLICATION;  Surgeon: Kathie Rhodes, MD;  [facility];  Service: Urology;  Laterality: N/A;  . JOINT REPLACEMENT  04/13/12   Right Knee replacement  . KNEE ARTHROSCOPY     bilateral  . LAPAROSCOPIC GASTRIC BANDING  07/10/2010  . LAPAROTOMY     for ruptured ovary and ovarian artery   . PACEMAKER INSERTION  03/10/13   [facility]  . POCKET REVISION N/A 12/08/2013   Procedure: POCKET REVISION;  Surgeon: Deboraha Sprang, MD;  Location: [facility] CATH LAB;  Service: Cardiovascular;  Laterality: N/A;  . THYROIDECTOMY  1998   Dr Margot Chimes  . TONSILLECTOMY    . TOTAL KNEE ARTHROPLASTY  04/13/2012   Procedure: TOTAL KNEE ARTHROPLASTY;  Surgeon: Rudean Haskell, MD;  Location: [place];  Service: Orthopedics;  Laterality: Right;  . Transabdominal ablation  03/02/13   [facility]  . VIDEO  BRONCHOSCOPY WITH ENDOBRONCHIAL ULTRASOUND N/A 02/07/2017   Procedure: VIDEO BRONCHOSCOPY WITH ENDOBRONCHIAL ULTRASOUND;  Surgeon: Marshell Garfinkel, MD;  Location: [place];  Service: Pulmonary;  Laterality: N/A;    Family History:  Family History  Problem Relation Age of Onset  . Cancer Father        COLON  . Heart disease Father 33       MI _0   . Diabetes Sister   . Diabetes Brother   . Diabetes Paternal Aunt   . Diabetes Paternal Grandmother     Social History:  reports that she has never smoked. She has never used smokeless tobacco. She reports that she does not drink alcohol or use drugs.  Allergies:  Allergies  Allergen Reactions  . Tikosyn [Dofetilide]     Prolonged QT interval Prolonged QT interval  . Xarelto [Rivaroxaban]     Nose Bleed X 6 hrs ; packing in ER  . Epinephrine Other (See Comments) and Rash    Dental form only (liquid). Patient stated she will become "out of it" she can hear you but cannot respond in normal fashion.  Marland Kitchen Ketamine     Hallucinations  . Eliquis [Apixaban] Other (See Comments)    BLEEDING IN URINE    Medications: Medications reviewed in epic  Please HPI for pertinent positives, otherwise complete 10 system ROS negative.  Mallampati Score: MD Evaluation Airway: WNL Heart: WNL Abdomen: WNL Chest/ Lungs: WNL ASA  Classification: 3 Mallampati/Airway Score: Three  Physical Exam: Temp (F)   97.9  97.9 (36.6)  06/01 0903  Pulse Rate   77  77  06/01 0903  Resp   16  16  06/01 0903  BP   146/80  146/80  06/01 0903  SpO2 (%)   97  97  06/01 0903   General: pleasant, WD, WN white female who is laying in bed in NAD HEENT: head is normocephalic, atraumatic.  Sclera are noninjected.  PERRL.  Ears and nose without any masses or lesions.  Mouth is pink and moist Heart: regular, rate, and rhythm.  Normal s1,s2. No obvious murmurs, gallops, or rubs noted.  Palpable radial pulses bilaterally Lungs: CTAB, no wheezes, rhonchi, or rales  noted.  Respiratory effort nonlabored.  Right chest wall mass is palpable Abd: soft, NT, ND, +BS, no masses, hernias, or organomegaly Psych: A&Ox3 with an appropriate affect.   Labs: WBC 4.8     RBC 4.38     Hemoglobin 12.7     HCT 38.9     Platelets 205       Imaging: No results found.  Assessment/Plan 1. Non-Hodgkin's Lymphoma We will plan to proceed with a bone marrow biopsy today.  Her INR is pending. Her other labs and vitals have been reviewed. Risks and Benefits discussed with the patient including, but not limited to bleeding, infection, damage to adjacent structures or low yield requiring additional tests. All of the patient's questions were answered, patient is agreeable to proceed. Consent signed and in chart.  Thank you for this interesting consult.  I greatly enjoyed meeting EVELINE SAUVE and look forward to participating in their care.  A copy of this report was sent to the requesting provider on this date.  Electronically Signed: Henreitta Cea 02/21/2017, 10:36 AM   I spent a total of  30 Minutes   in face to face in clinical consultation, greater than 50% of which was counseling/coordinating care for non-hodgkins lymphoma

## 2017-02-21 NOTE — Discharge Instructions (Signed)
Bone Marrow Aspiration and Bone Marrow Biopsy, Adult, Care After °This sheet gives you information about how to care for yourself after your procedure. Your health care provider may also give you more specific instructions. If you have problems or questions, contact your health care provider. °What can I expect after the procedure? °After the procedure, it is common to have: °· Mild pain and tenderness. °· Swelling. °· Bruising. ° °Follow these instructions at home: °· Take over-the-counter or prescription medicines only as told by your health care provider. °· Do not take baths, swim, or use a hot tub until your health care provider approves. Ask if you can take a shower or have a sponge bath. °· Follow instructions from your health care provider about how to take care of the puncture site. Make sure you: °? Wash your hands with soap and water before you change your bandage (dressing). If soap and water are not available, use hand sanitizer. °? Change your dressing as told by your health care provider. °· Check your puncture site every day for signs of infection. Check for: °? More redness, swelling, or pain. °? More fluid or blood. °? Warmth. °? Pus or a bad smell. °· Return to your normal activities as told by your health care provider. Ask your health care provider what activities are safe for you. °· Do not drive for 24 hours if you were given a medicine to help you relax (sedative). °· Keep all follow-up visits as told by your health care provider. This is important. °Contact a health care provider if: °· You have more redness, swelling, or pain around the puncture site. °· You have more fluid or blood coming from the puncture site. °· Your puncture site feels warm to the touch. °· You have pus or a bad smell coming from the puncture site. °· You have a fever. °· Your pain is not controlled with medicine. °This information is not intended to replace advice given to you by your health care provider. Make sure  you discuss any questions you have with your health care provider. °Document Released: 03/29/2005 Document Revised: 03/29/2016 Document Reviewed: 02/21/2016 °Elsevier Interactive Patient Education © 2018 Elsevier Inc. ° °Moderate Conscious Sedation, Adult, Care After °These instructions provide you with information about caring for yourself after your procedure. Your health care provider may also give you more specific instructions. Your treatment has been planned according to current medical practices, but problems sometimes occur. Call your health care provider if you have any problems or questions after your procedure. °What can I expect after the procedure? °After your procedure, it is common: °· To feel sleepy for several hours. °· To feel clumsy and have poor balance for several hours. °· To have poor judgment for several hours. °· To vomit if you eat too soon. ° °Follow these instructions at home: °For at least 24 hours after the procedure: ° °· Do not: °? Participate in activities where you could fall or become injured. °? Drive. °? Use heavy machinery. °? Drink alcohol. °? Take sleeping pills or medicines that cause drowsiness. °? Make important decisions or sign legal documents. °? Take care of children on your own. °· Rest. °Eating and drinking °· Follow the diet recommended by your health care provider. °· If you vomit: °? Drink water, juice, or soup when you can drink without vomiting. °? Make sure you have little or no nausea before eating solid foods. °General instructions °· Have a responsible adult stay with you until you   are awake and alert. °· Take over-the-counter and prescription medicines only as told by your health care provider. °· If you smoke, do not smoke without supervision. °· Keep all follow-up visits as told by your health care provider. This is important. °Contact a health care provider if: °· You keep feeling nauseous or you keep vomiting. °· You feel light-headed. °· You develop a  rash. °· You have a fever. °Get help right away if: °· You have trouble breathing. °This information is not intended to replace advice given to you by your health care provider. Make sure you discuss any questions you have with your health care provider. °Document Released: 06/30/2013 Document Revised: 02/12/2016 Document Reviewed: 12/30/2015 °Elsevier Interactive Patient Education © 2018 Elsevier Inc. ° °

## 2017-02-21 NOTE — Procedures (Signed)
Interventional Radiology Procedure Note  Procedure: CT guided aspirate and core biopsy of left posterior iliac bone Complications: None Recommendations: - Bedrest supine x 1 hrs - OTC's PRN  Pain - Follow biopsy results  Signed,  Dawnyel Leven S. Broderick Fonseca, DO    

## 2017-02-24 ENCOUNTER — Ambulatory Visit: Payer: Medicare Other

## 2017-02-24 ENCOUNTER — Ambulatory Visit
Admission: RE | Admit: 2017-02-24 | Discharge: 2017-02-24 | Disposition: A | Discharge status: Home / Self Care | Payer: Medicare Other | Source: Ambulatory Visit | Attending: Radiation Oncology | Admitting: Radiation Oncology

## 2017-02-24 ENCOUNTER — Other Ambulatory Visit: Payer: Self-pay | Admitting: Hematology and Oncology

## 2017-02-24 ENCOUNTER — Ambulatory Visit (HOSPITAL_COMMUNITY)
Admission: RE | Admit: 2017-02-24 | Discharge: 2017-02-24 | Disposition: A | Discharge status: Home / Self Care | Payer: Medicare Other | Source: Ambulatory Visit | Attending: Hematology and Oncology | Admitting: Hematology and Oncology

## 2017-02-24 ENCOUNTER — Encounter (HOSPITAL_COMMUNITY): Payer: Self-pay

## 2017-02-24 ENCOUNTER — Other Ambulatory Visit: Payer: Self-pay | Admitting: Urology

## 2017-02-24 DIAGNOSIS — Z85850 Personal history of malignant neoplasm of thyroid: Secondary | ICD-10-CM | POA: Diagnosis not present

## 2017-02-24 DIAGNOSIS — C8520 Mediastinal (thymic) large B-cell lymphoma, unspecified site: Secondary | ICD-10-CM | POA: Insufficient documentation

## 2017-02-24 DIAGNOSIS — C383 Malignant neoplasm of mediastinum, part unspecified: Secondary | ICD-10-CM | POA: Diagnosis not present

## 2017-02-24 DIAGNOSIS — Z8673 Personal history of transient ischemic attack (TIA), and cerebral infarction without residual deficits: Secondary | ICD-10-CM | POA: Diagnosis not present

## 2017-02-24 DIAGNOSIS — Z9884 Bariatric surgery status: Secondary | ICD-10-CM | POA: Insufficient documentation

## 2017-02-24 DIAGNOSIS — I4891 Unspecified atrial fibrillation: Secondary | ICD-10-CM | POA: Insufficient documentation

## 2017-02-24 DIAGNOSIS — Z452 Encounter for adjustment and management of vascular access device: Secondary | ICD-10-CM | POA: Diagnosis not present

## 2017-02-24 DIAGNOSIS — Z510 Encounter for antineoplastic radiation therapy: Secondary | ICD-10-CM | POA: Diagnosis not present

## 2017-02-24 DIAGNOSIS — Z853 Personal history of malignant neoplasm of breast: Secondary | ICD-10-CM | POA: Diagnosis not present

## 2017-02-24 DIAGNOSIS — C8522 Mediastinal (thymic) large B-cell lymphoma, intrathoracic lymph nodes: Secondary | ICD-10-CM

## 2017-02-24 DIAGNOSIS — Z950 Presence of cardiac pacemaker: Secondary | ICD-10-CM | POA: Diagnosis not present

## 2017-02-24 DIAGNOSIS — Z7901 Long term (current) use of anticoagulants: Secondary | ICD-10-CM | POA: Diagnosis not present

## 2017-02-24 DIAGNOSIS — C8590 Non-Hodgkin lymphoma, unspecified, unspecified site: Secondary | ICD-10-CM | POA: Diagnosis present

## 2017-02-24 DIAGNOSIS — D7589 Other specified diseases of blood and blood-forming organs: Secondary | ICD-10-CM | POA: Diagnosis not present

## 2017-02-24 DIAGNOSIS — Z79899 Other long term (current) drug therapy: Secondary | ICD-10-CM | POA: Insufficient documentation

## 2017-02-24 DIAGNOSIS — Z96653 Presence of artificial knee joint, bilateral: Secondary | ICD-10-CM | POA: Diagnosis not present

## 2017-02-24 DIAGNOSIS — C8332 Diffuse large B-cell lymphoma, intrathoracic lymph nodes: Secondary | ICD-10-CM | POA: Diagnosis not present

## 2017-02-24 HISTORY — PX: IR US GUIDE VASC ACCESS RIGHT: IMG2390

## 2017-02-24 HISTORY — PX: IR FLUORO GUIDE PORT INSERTION RIGHT: IMG5741

## 2017-02-24 MED ORDER — LIDOCAINE-EPINEPHRINE (PF) 2 %-1:200000 IJ SOLN
INTRAMUSCULAR | Status: AC
  Filled 2017-02-24: qty 20

## 2017-02-24 MED ORDER — MIDAZOLAM HCL 2 MG/2ML IJ SOLN
INTRAMUSCULAR | Status: AC
  Filled 2017-02-24: qty 6

## 2017-02-24 MED ORDER — FENTANYL CITRATE (PF) 100 MCG/2ML IJ SOLN
INTRAMUSCULAR | Status: AC | PRN
  Administered 2017-02-24 (×2): 50 ug via INTRAVENOUS

## 2017-02-24 MED ORDER — SODIUM CHLORIDE 0.9 % IV SOLN: INTRAVENOUS | Status: DC

## 2017-02-24 MED ORDER — HEPARIN SOD (PORK) LOCK FLUSH 100 UNIT/ML IV SOLN
INTRAVENOUS | Status: AC
  Filled 2017-02-24: qty 5

## 2017-02-24 MED ORDER — CEFAZOLIN SODIUM-DEXTROSE 2-4 GM/100ML-% IV SOLN
INTRAVENOUS | Status: AC
  Administered 2017-02-24: 2 g via INTRAVENOUS
  Filled 2017-02-24: qty 100

## 2017-02-24 MED ORDER — IOPAMIDOL (ISOVUE-300) INJECTION 61%
INTRAVENOUS | Status: AC
  Filled 2017-02-24: qty 50

## 2017-02-24 MED ORDER — MIDAZOLAM HCL 2 MG/2ML IJ SOLN
INTRAMUSCULAR | Status: AC | PRN
  Administered 2017-02-24 (×3): 1 mg via INTRAVENOUS

## 2017-02-24 MED ORDER — FENTANYL CITRATE (PF) 100 MCG/2ML IJ SOLN
INTRAMUSCULAR | Status: AC
  Filled 2017-02-24: qty 4

## 2017-02-24 MED ORDER — CEFAZOLIN SODIUM-DEXTROSE 2-4 GM/100ML-% IV SOLN
2.0000 g | INTRAVENOUS | Status: AC
  Administered 2017-02-24: 2 g via INTRAVENOUS

## 2017-02-24 NOTE — Telephone Encounter (Signed)
LMOM again.

## 2017-02-24 NOTE — Discharge Instructions (Signed)
Implanted Port Home Guide °An implanted port is a type of central line that is placed under the skin. Central lines are used to provide IV access when treatment or nutrition needs to be given through a person’s veins. Implanted ports are used for long-term IV access. An implanted port may be placed because: °· You need IV medicine that would be irritating to the small veins in your hands or arms. °· You need long-term IV medicines, such as antibiotics. °· You need IV nutrition for a long period. °· You need frequent blood draws for lab tests. °· You need dialysis. ° °Implanted ports are usually placed in the chest area, but they can also be placed in the upper arm, the abdomen, or the leg. An implanted port has two main parts: °· Reservoir. The reservoir is round and will appear as a small, raised area under your skin. The reservoir is the part where a needle is inserted to give medicines or draw blood. °· Catheter. The catheter is a thin, flexible tube that extends from the reservoir. The catheter is placed into a large vein. Medicine that is inserted into the reservoir goes into the catheter and then into the vein. ° °How will I care for my incision site? °Do not get the incision site wet. Bathe or shower as directed by your health care provider. °How is my port accessed? °Special steps must be taken to access the port: °· Before the port is accessed, a numbing cream can be placed on the skin. This helps numb the skin over the port site. °· Your health care provider uses a sterile technique to access the port. °? Your health care provider must put on a mask and sterile gloves. °? The skin over your port is cleaned carefully with an antiseptic and allowed to dry. °? The port is gently pinched between sterile gloves, and a needle is inserted into the port. °· Only "non-coring" port needles should be used to access the port. Once the port is accessed, a blood return should be checked. This helps ensure that the port  is in the vein and is not clogged. °· If your port needs to remain accessed for a constant infusion, a clear (transparent) bandage will be placed over the needle site. The bandage and needle will need to be changed every week, or as directed by your health care provider. °· Keep the bandage covering the needle clean and dry. Do not get it wet. Follow your health care provider’s instructions on how to take a shower or bath while the port is accessed. °· If your port does not need to stay accessed, no bandage is needed over the port. ° °What is flushing? °Flushing helps keep the port from getting clogged. Follow your health care provider’s instructions on how and when to flush the port. Ports are usually flushed with saline solution or a medicine called heparin. The need for flushing will depend on how the port is used. °· If the port is used for intermittent medicines or blood draws, the port will need to be flushed: °? After medicines have been given. °? After blood has been drawn. °? As part of routine maintenance. °· If a constant infusion is running, the port may not need to be flushed. ° °How long will my port stay implanted? °The port can stay in for as long as your health care provider thinks it is needed. When it is time for the port to come out, surgery will be   done to remove it. The procedure is similar to the one performed when the port was put in. When should I seek immediate medical care? When you have an implanted port, you should seek immediate medical care if:  You notice a bad smell coming from the incision site.  You have swelling, redness, or drainage at the incision site.  You have more swelling or pain at the port site or the surrounding area.  You have a fever that is not controlled with medicine.  This information is not intended to replace advice given to you by your health care provider. Make sure you discuss any questions you have with your health care provider. Document  Released: 09/09/2005 Document Revised: 02/15/2016 Document Reviewed: 05/17/2013 Elsevier Interactive Patient Education  2017 [place].  Moderate Conscious Sedation, Adult, Care After These instructions provide you with information about caring for yourself after your procedure. Your health care provider may also give you more specific instructions. Your treatment has been planned according to current medical practices, but problems sometimes occur. Call your health care provider if you have any problems or questions after your procedure. What can I expect after the procedure? After your procedure, it is common:  To feel sleepy for several hours.  To feel clumsy and have poor balance for several hours.  To have poor judgment for several hours.  To vomit if you eat too soon.  Follow these instructions at home: For at least 24 hours after the procedure:   Do not: ? Participate in activities where you could fall or become injured. ? Drive. ? Use heavy machinery. ? Drink alcohol. ? Take sleeping pills or medicines that cause drowsiness. ? Make important decisions or sign legal documents. ? Take care of children on your own.  Rest. Eating and drinking  Follow the diet recommended by your health care provider.  If you vomit: ? Drink water, juice, or soup when you can drink without vomiting. ? Make sure you have little or no nausea before eating solid foods. General instructions  Have a responsible adult stay with you until you are awake and alert.  Take over-the-counter and prescription medicines only as told by your health care provider.  If you smoke, do not smoke without supervision.  Keep all follow-up visits as told by your health care provider. This is important. Contact a health care provider if:  You keep feeling nauseous or you keep vomiting.  You feel light-headed.  You develop a rash.  You have a fever. Get help right away if:  You have trouble  breathing. This information is not intended to replace advice given to you by your health care provider. Make sure you discuss any questions you have with your health care provider. Document Released: 06/30/2013 Document Revised: 02/12/2016 Document Reviewed: 12/30/2015 [employer] Interactive Patient Education  [employer].

## 2017-02-24 NOTE — Consult Note (Signed)
Chief Complaint: Patient was seen in consultation today for Port-A-Cath placement  Referring Physician(s): Gudena,Vinay  Supervising Physician: Arne Cleveland  Patient Status: [facility] - Out-pt  History of Present Illness: Kayla Harrison is a 71 y.o. female who has been diagnosed with NHL.  She is currently undergoing radiation to her tumor in her right chest wall causing SVC syndrome.  Patient underwent bone marrow biopsy 02/21/17 to further determine characteristics of the disease process. She presents today for Port-A-Cath placement.   She is getting radiation to the right chest.  She also has a pacemaker in place on the left.  Patient has been NPO.  She had held her blood thinners. Last dose of lovenox was yesterday.  She has held her coumadin since 5/26.  Reports she has been in her usual state of health and has improved since starting therapy for SVC syndrome.   Past Medical History:  Diagnosis Date  . Arthritis    osteoarthritis - knees   . Atrial fibrillation [facility])    ablation- 2x's-- 1st time- [facility], 2nd event at [facility] in 2008. Convergent ablation at [facility] 6/14  . Breast cancer ([place])    Dr Margot Chimes, total thyroidectomy- 1999- for cancer  . Colon polyp    Dr Earlean Shawl  . Complication of anesthesia    Ketamine produces LSD reaction, bright colored nightmarish experience   . Dyslipidemia   . Dysrhythmia    a fib with internal/external ablation, currently NSR  . Hepatitis    [place]itis   . Hx of thyroid cancer    Dr Forde Dandy  . Hyperlipidemia   . Hypothyroidism   . Morbid obesity ([place])    Status post lap band surgery  . Normal cardiac stress test    2008- cardiac stress/echo  . Pacemaker-[employer]tr[employer]   . Pneumonia 2010   [facility]- during that event had to be cardioverted   . Renal calculi   . Sinus node dysfunction ([place])    Complicating convergent ablation 6/14  . Stroke [facility])    2003- [place]     Past Surgical History:    Procedure Laterality Date  . ABDOMINAL HYSTERECTOMY  1983  . afib ablation     X 2; [facility] & Dr Caryl Comes  . APPENDECTOMY    . BREAST LUMPECTOMY  2010  . bso  1998  . CARDIOVERSION  10/09/2012   Procedure: CARDIOVERSION;  Surgeon: Minus Breeding, MD;  Location: [place];  Service: Cardiovascular;  Laterality: N/A;  . CARDIOVERSION  10/09/2012   Procedure: CARDIOVERSION;  Surgeon: Minus Breeding, MD;  Location: [facility] ENDOSCOPY;  Service: Cardiovascular;  Laterality: N/A;  [name] gave the ok to add pt to the add on , but we must check to find out if the can add pt on at [phone] ( [phone])  . CARDIOVERSION N/A 11/20/2012   Procedure: CARDIOVERSION;  Surgeon: Fay Records, MD;  Location: [facility] ENDOSCOPY;  Service: Cardiovascular;  Laterality: N/A;  . CHOLECYSTECTOMY    . COLONOSCOPY W/ POLYPECTOMY     Dr Earlean Shawl  . convergent    . CYSTOSCOPY N/A 02/06/2015   Procedure: CYSTOSCOPY;  Surgeon: Kathie Rhodes, MD;  Location: [facility] ORS;  Service: Urology;  Laterality: N/A;  . CYSTOSCOPY WITH RETROGRADE PYELOGRAM, URETEROSCOPY AND STENT PLACEMENT Right 02/06/2015   Procedure: RETROGRADE PYELOGRAM, RIGHT URETEROSCOPY STENT PLACEMENT;  Surgeon: Kathie Rhodes, MD;  Location: [facility] ORS;  Service: Urology;  Laterality: Right;  . EYE SURGERY  cataract surgery  . HOLMIUM LASER APPLICATION N/A 05/21/9370   Procedure: HOLMIUM LASER APPLICATION;  Surgeon: Kathie Rhodes, MD;  Location: [facility] ORS;  Service: Urology;  Laterality: N/A;  . JOINT REPLACEMENT  04/13/12   Right Knee replacement  . KNEE ARTHROSCOPY     bilateral  . LAPAROSCOPIC GASTRIC BANDING  07/10/2010  . LAPAROTOMY     for ruptured ovary and ovarian artery   . PACEMAKER INSERTION  03/10/13   [facility]  . POCKET REVISION N/A 12/08/2013   Procedure: POCKET REVISION;  Surgeon: Deboraha Sprang, MD;  Location: [facility] CATH LAB;  Service: Cardiovascular;  Laterality: N/A;  . THYROIDECTOMY  1998   Dr Margot Chimes  . TONSILLECTOMY    . TOTAL KNEE ARTHROPLASTY  04/13/2012   Procedure: TOTAL KNEE  ARTHROPLASTY;  Surgeon: Rudean Haskell, MD;  Location: [place];  Service: Orthopedics;  Laterality: Right;  . Transabdominal ablation  03/02/13   [facility]  . VIDEO BRONCHOSCOPY WITH ENDOBRONCHIAL ULTRASOUND N/A 02/07/2017   Procedure: VIDEO BRONCHOSCOPY WITH ENDOBRONCHIAL ULTRASOUND;  Surgeon: Marshell Garfinkel, MD;  Location: [place];  Service: Pulmonary;  Laterality: N/A;    Allergies: Tikosyn [dofetilide]; Xarelto [rivaroxaban]; Epinephrine; Ketamine; and Eliquis [apixaban]  Medications: Prior to Admission medications   Medication Sig Start Date End Date Taking? Authorizing Provider  amiodarone (PACERONE) 200 MG tablet Take 1 tablet by mouth twice a day for 1 week then reduce to 1 tablet daily 02/18/17  Yes Sherran Needs, NP  cycloSPORINE (RESTASIS) 0.05 % ophthalmic emulsion Place 1 drop into both eyes 2 (two) times daily.   Yes [provider]  levothyroxine (SYNTHROID, LEVOTHROID) 125 MCG tablet Take 1 tablet (125 mcg total) by mouth daily before breakfast. 02/08/17  Yes Bonnielee Haff, MD  Lifitegrast Shirley [place]ar) 5 % SOLN Apply 1 drop to eye 2 (two) times daily.   Yes [provider]  Multiple Vitamin (MULTIVITAMIN) tablet Take 1 tablet by mouth daily.    Yes [provider]  warfarin (COUMADIN) 5 MG tablet TAKE AS DIRECTED BY  COUMADIN CLINIC 10/07/16  Yes Deboraha Sprang, MD  enoxaparin (LOVENOX) 150 MG/ML injection Inject 1 mL (150 mg total) into the skin daily. 02/13/17   Deboraha Sprang, MD  furosemide (LASIX) 40 MG tablet Take 40 mg by mouth daily as needed for fluid.     [provider]  sucralfate (CARAFATE) 1 g tablet Take 1 tablet (1 g total) by mouth 4 (four) times daily. 02/14/17   Kyung Rudd, MD  traMADol (ULTRAM) 50 MG tablet Take 1-2 tablets (50-100 mg total) by mouth every 6 (six) hours as needed. Patient not taking: Reported on 02/18/2017 02/10/17   Hayden Pedro, PA-C     Family History  Problem Relation Age of Onset  . Cancer Father         COLON  . Heart disease Father 59       MI '@autopsy'$   . Diabetes Sister   . Diabetes Brother   . Diabetes Paternal Aunt   . Diabetes Paternal Grandmother     Social History   Social History  . Marital status: Single    Spouse name: N/A  . Number of children: 0  . Years of education: N/A   Occupational History  . [employer] Retired   Social History Main Topics  . Smoking status: Never Smoker  . Smokeless tobacco: Never Used  . Alcohol use No  . Drug use: No  . Sexual activity: Not  Asked   Other Topics Concern  . None   Social History Narrative   SINGLE    NO CHILDREN   NEVER SMOKED   EXERCISE 3 X WK          Review of Systems  Constitutional: Negative for fatigue and fever.  Respiratory: Negative for cough and shortness of breath.   Cardiovascular: Negative for chest pain.  Psychiatric/Behavioral: Negative for behavioral problems and confusion.    Vital Signs: BP (!) 163/96 (BP Location: Right Arm)   Pulse 81   Temp 97.6 F (36.4 C) (Oral)   Resp 16   Wt 231 lb 6.4 oz (105 kg)   SpO2 94%   BMI 37.35 kg/m   Physical Exam  Constitutional: She is oriented to person, place, and time. She appears well-developed.  Cardiovascular: Normal rate, regular rhythm and normal heart sounds.   Pulmonary/Chest: Effort normal and breath sounds normal. No respiratory distress.  Neurological: She is alert and oriented to person, place, and time.  Skin: Skin is warm and dry.  Psychiatric: She has a normal mood and affect. Her behavior is normal. Judgment and thought content normal.  Nursing note and vitals reviewed.   Mallampati Score:  MD Evaluation Airway: WNL Heart: WNL Abdomen: WNL Chest/ Lungs: WNL ASA  Classification: 3 Mallampati/Airway Score: Two  Imaging: Dg Chest 2 View  Result Date: 02/07/2017 CLINICAL DATA:  Status post bronchoscopy with biopsy. EXAM: CHEST  2 VIEW COMPARISON:  02/03/2017 CT, chest radiograph  and prior exams FINDINGS: Cardiomegaly, left atrial clip and left-sided pacemaker again noted. A superior right mediastinal mass is again identified. There is no evidence of pneumothorax or pleural effusion. No airspace disease identified. IMPRESSION: No evidence of acute abnormality. No evidence of pneumothorax or pleural effusion. Superior right mediastinal mass again identified. Electronically Signed   By: Margarette Canada M.D.   On: 02/07/2017 16:02   Dg Chest 2 View  Result Date: 02/03/2017 CLINICAL DATA:  71 year old female with increased shortness breath over the past 5-6 days. Thyroid cancer. Breast cancer. Initial encounter. EXAM: CHEST  2 VIEW COMPARISON:  06/30/2015 chest x-ray FINDINGS: Abnormality right paratracheal region suspicious for mass. Vascular abnormality secondary consideration. Chest CT recommended for further evaluation. Post thyroidectomy. Prior clipping left atrium. Sequential pacemaker in place. Heart size within normal limits. No infiltrate, congestive heart failure or pneumothorax. Erosion distal right clavicle possibly degenerative in origin. Calcified aorta. IMPRESSION: Abnormality right paratracheal region suspicious for mass. Vascular abnormality secondary consideration. Chest CT recommended for further evaluation. Post thyroidectomy and breast surgery. Prior clipping left atrium. Sequential pacemaker in place. Heart size within normal limits. No infiltrate or congestive heart failure. Erosion distal right clavicle possibly degenerative in origin. These results were called by telephone at the time of interpretation on 02/03/2017 at 10:11 am to Dr. Lajean Saver , who verbally acknowledged these results. Electronically Signed   By: Genia Del M.D.   On: 02/03/2017 10:15   Ct Head Wo Contrast  Result Date: 02/05/2017 CLINICAL DATA:  Headache for 1 month. EXAM: CT HEAD WITHOUT CONTRAST TECHNIQUE: Contiguous axial images were obtained from the base of the skull through the vertex  without intravenous contrast. COMPARISON:  04/09/2016 FINDINGS: Brain: No evidence of acute infarction, hemorrhage, hydrocephalus, extra-axial collection or mass lesion/mass effect. Chronic small-vessel white matter ischemic changes again noted. Vascular: Intracranial atherosclerotic calcifications again identified. Skull: Normal. Negative for fracture or focal lesion. Sinuses/Orbits: No acute finding. Other: None. IMPRESSION: No evidence of acute intracranial abnormality. Chronic small-vessel white  matter ischemic changes. Electronically Signed   By: Margarette Canada M.D.   On: 02/05/2017 14:01   Ct Chest W Contrast  Result Date: 02/03/2017 CLINICAL DATA:  Pt c/o difficulty breathing dizziness syncope, Afib off and on but on almost all day today. History of thyroid cancer and breast cancer. EXAM: CT CHEST WITH CONTRAST TECHNIQUE: Multidetector CT imaging of the chest was performed during intravenous contrast administration. CONTRAST:  68m ISOVUE-300 IOPAMIDOL (ISOVUE-300) INJECTION 61% COMPARISON:  02/03/2017 and CT of the chest on 04/21/2013 FINDINGS: Cardiovascular: There is extensive coronary artery calcification. Left atrial appendage clip is in place. Mitral annulus calcification. The heart is mildly enlarged. No pericardial effusion. Bovine arch anatomy. There is narrowing of the superior vena cava secondary to mediastinal mass described below. There is filling and numerous collateral vessels. Left upper extremity contrast injection. Mediastinum/Nodes: Superior mediastinal mass is 5.4 x 7.1 x 6.0 cm. Mass results in significant narrowing of the superior vena cava and has a long interface with the ascending aorta, trachea, superior vena cava, and right brachiocephalic artery. Mass is contiguous with the right hilar structures and encases the trachea and right mainstem bronchus. Small mediastinal lymph nodes are identified in the subcarinal region (1.6 cm) left hilar region (1.0 cm). Lungs/Pleura: Multiple  small pulmonary nodules are present. In the right upper lobe, a 6 mm nodule is identified on image 39 of series 7. In the right upper lobe a 4 mm nodule is identified on image 50 of series 7. In the left lower lobe, a 5 mm nodule is identified on image 90 of series 7. There are no pleural effusions or focal consolidations. Upper Abdomen: Multiple calcified granulomata are identified within the spleen. Patient has a lap band in place around the proximal stomach. A small low-attenuation lesion is identified in the left hepatic lobe measuring 9 mm on image 117 of series 3, stable in appearance and consistent with cyst. Small calcified hepatic granulomata are present. The adrenal glands are normal in appearance. Bilateral intrarenal calculi. Musculoskeletal: Degenerative changes are seen in the thoracic spine. No suspicious lytic or blastic lesions are identified. IMPRESSION: 1. Large superior mediastinal mass contiguous with the right hilar region resulting in significant narrowing of the superior vena cava and collateral vessels filling. Findings are suspicious for malignancy, either primary secondary. 2. Small pulmonary nodules as described, largest measuring 6 mm in the right upper lobe. 3. Coronary artery disease. Mitral annulus calcification. Cardiomegaly. 4. Bovine arch anatomy. 5. Prior granulomatous disease. Numerous calcified granulomata within the liver and spleen. 6. Hepatic cyst. Electronically Signed   By: ENolon NationsM.D.   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 EXAM: NUCLEAR MEDICINE PET SKULL BASE TO THIGH TECHNIQUE: 11.6 mCi F-18 FDG was injected intravenously. Full-ring PET imaging was performed from the skull base to thigh after the radiotracer. CT data was obtained and used for attenuation correction and anatomic localization. FASTING BLOOD GLUCOSE:   Value: 107 mg/dl COMPARISON:  CT chest 02/03/2017 and CT abdomen pelvis 05/18/2015. FINDINGS: NECK No hypermetabolic lymph nodes in the neck. CT images show no acute findings. CHEST Right paratracheal nodal mass measures approximately 4.1 x 4.3 cm with an SUV max of 9.1. No additional hypermetabolic lymph nodes in the chest. 7 mm left lower lobe nodule (series 8, image 30), too small for PET resolution. Atherosclerotic calcification of the aorta and coronary  arteries. Heart is mildly enlarged. No pericardial or pleural effusion. ABDOMEN/PELVIS No abnormal hypermetabolism in the liver, adrenal glands, spleen or pancreas. No hypermetabolic lymph nodes. 10 mm low-attenuation lesion in segment 4 of the liver is unchanged, indicative of a benign cyst or hemangioma. Liver is otherwise unremarkable. Gallbladder is absent. Pancreas and spleen are unremarkable. There is a 9 mm peripherally calcified splenic artery aneurysm in the hilum, stable. Adrenal glands are unremarkable. Moderate to severe right hydronephrosis. There is a 1.4 cm stone in the right renal pelvis, at the ureteropelvic junction, as before. A 9 mm stone in the proximal right ureter is new. Remainder of the right ureter is decompressed. Stones are seen in the left kidney. Left ureter is decompressed. Bladder is grossly unremarkable. Laparoscopic band is seen at the proximal stomach. Stomach and bowel are otherwise grossly unremarkable. SKELETON No abnormal osseous hypermetabolism. IMPRESSION: 1. Hypermetabolic right paratracheal nodal mass, consistent with the given history of lymphoma. No evidence of hypermetabolic extrathoracic lymphoma. 2. Moderate to severe right hydronephrosis with stones at the right ureteral pelvic junction (chronic) and proximal right ureter (new). These results will be called to the ordering clinician or representative by the Radiologist Assistant, and communication documented in the PACS or zVision Dashboard. 3. 7 mm left lower  lobe nodule, too small for PET resolution. 4. Left renal stones. 5. Aortic atherosclerosis (ICD10-170.0). Coronary artery calcification. Electronically Signed   By: Lorin Picket M.D.   On: 02/19/2017 09:31   Ct Biopsy  Result Date: 02/21/2017 INDICATION: 71 year old female with a history of non-Hodgkin's lymphoma EXAM: CT BIOPSY; CT BONE MARROW BIOPSY AND ASPIRATION MEDICATIONS: None. ANESTHESIA/SEDATION: Moderate (conscious) sedation was employed during this procedure. A total of Versed 3.0 mg and Fentanyl 100 mcg was administered intravenously. Moderate Sedation Time: 14 minutes. The patient's level of consciousness and vital signs were monitored continuously by radiology nursing throughout the procedure under my direct supervision. FLUOROSCOPY TIME:  CT COMPLICATIONS: None PROCEDURE: The procedure risks, benefits, and alternatives were explained to the patient. Questions regarding the procedure were encouraged and answered. The patient understands and consents to the procedure. Scout CT of the pelvis was performed for surgical planning purposes. The posterior pelvis was prepped with Betadinein a sterile fashion, and a sterile drape was applied covering the operative field. A sterile gown and sterile gloves were used for the procedure. Local anesthesia was provided with 1% Lidocaine. We targeted the left posterior iliac bone for biopsy. The skin and subcutaneous tissues were infiltrated with 1% lidocaine without epinephrine. A small stab incision was made with an 11 blade scalpel, and an 11 gauge Murphy needle was advanced with CT guidance to the posterior cortex. Manual forced was used to advance the needle through the posterior cortex and the stylet was removed. A bone marrow aspirate was retrieved and passed to a cytotechnologist in the room. The Murphy needle was then advanced without the stylet for a core biopsy. The core biopsy was retrieved and also passed to a cytotechnologist. Manual pressure was  used for hemostasis and a sterile dressing was placed. No complications were encountered no significant blood loss was encountered. Patient tolerated the procedure well and remained hemodynamically stable throughout. IMPRESSION: Status post CT-guided bone marrow biopsy, with tissue specimen sent to pathology for complete histopathologic analysis Signed, Dulcy Fanny. Earleen Newport, DO [facility] Radiology Electronically Signed   By: Corrie Mckusick D.O.   On: 02/21/2017 12:33   Ct Bone Marrow Biopsy & Aspiration  Result Date:  02/21/2017 INDICATION: 71 year old female with a history of non-Hodgkin's lymphoma EXAM: CT BIOPSY; CT BONE MARROW BIOPSY AND ASPIRATION MEDICATIONS: None. ANESTHESIA/SEDATION: Moderate (conscious) sedation was employed during this procedure. A total of Versed 3.0 mg and Fentanyl 100 mcg was administered intravenously. Moderate Sedation Time: 14 minutes. The patient's level of consciousness and vital signs were monitored continuously by radiology nursing throughout the procedure under my direct supervision. FLUOROSCOPY TIME:  CT COMPLICATIONS: None PROCEDURE: The procedure risks, benefits, and alternatives were explained to the patient. Questions regarding the procedure were encouraged and answered. The patient understands and consents to the procedure. Scout CT of the pelvis was performed for surgical planning purposes. The posterior pelvis was prepped with Betadinein a sterile fashion, and a sterile drape was applied covering the operative field. A sterile gown and sterile gloves were used for the procedure. Local anesthesia was provided with 1% Lidocaine. We targeted the left posterior iliac bone for biopsy. The skin and subcutaneous tissues were infiltrated with 1% lidocaine without epinephrine. A small stab incision was made with an 11 blade scalpel, and an 11 gauge Murphy needle was advanced with CT guidance to the posterior cortex. Manual forced was  used to advance the needle through the posterior cortex and the stylet was removed. A bone marrow aspirate was retrieved and passed to a cytotechnologist in the room. The Murphy needle was then advanced without the stylet for a core biopsy. The core biopsy was retrieved and also passed to a cytotechnologist. Manual pressure was used for hemostasis and a sterile dressing was placed. No complications were encountered no significant blood loss was encountered. Patient tolerated the procedure well and remained hemodynamically stable throughout. IMPRESSION: Status post CT-guided bone marrow biopsy, with tissue specimen sent to pathology for complete histopathologic analysis Signed, Dulcy Fanny. Earleen Newport, DO [facility] Radiology Electronically Signed   By: Corrie Mckusick D.O.   On: 02/21/2017 12:33    Labs:  CBC:  Recent Labs  02/06/17 0236 02/07/17 0317 02/08/17 0233 02/21/17 0944  WBC 6.2 6.0 7.1 4.8  HGB 12.1 11.8* 11.4* 12.7  HCT 38.3 37.4 36.9 38.9  PLT 194 190 186 205    COAGS:  Recent Labs  02/07/17 0317 02/08/17 0233 02/13/17 1536 02/21/17 0944  INR 1.39 1.27 2.1 1.11  APTT 40*  --   --   --     BMP:  Recent Labs  02/05/17 0239 02/06/17 0236 02/07/17 0317 02/08/17 0233  NA 139 140 140 138  K 3.7 4.2 4.1 4.1  CL 107 108 106 106  CO2 '24 27 25 24  '$ GLUCOSE 122* 110* 100* 115*  BUN 22* 22* 18 15  CALCIUM 8.6* 9.0 8.8* 8.9  CREATININE 1.08* 0.95 0.95 1.01*  GFRNONAA 50* 58* 58* 54*  GFRAA 58* >60 >60 >60    LIVER FUNCTION TESTS:  Recent Labs  02/04/17 0204  BILITOT 0.8  AST 18  ALT 16  ALKPHOS 65  PROT 5.8*  ALBUMIN 3.2*    TUMOR MARKERS: No results for input(s): AFPTM, CEA, CA199, CHROMGRNA in the last 8760 hours.  Assessment and Plan: Patient with Non-Hodgkin's Lymphoma known to IR from bone marrow biopsy a few days ago now presents for Port-A-Cath treatment for chemotherapy. She is getting radiation to her  right chest.  She also has a pacemaker on the left.  Patient understands her condition and case are not straightforward.  Have reviewed case with Dr. Vernard Gambles who approves patient for procedure today.  She has  been NPO and appropriately held her blood thinners. Her last dose of lovenox was yesterday morning.  No need for additional labs today.  Risks and benefits discussed with the patient including, but not limited to bleeding, infection, pneumothorax, or fibrin sheath development and need for additional procedures. All of the patient's questions were answered, patient is agreeable to proceed. Consent signed and in chart.  Thank you for this interesting consult.  I greatly enjoyed meeting JAYLIE NEAVES and look forward to participating in their care.  A copy of this report was sent to the requesting provider on this date.  Electronically Signed: Docia Barrier, PA 02/24/2017, 1:35 PM   I spent a total of  30 Minutes   in face to face in clinical consultation, greater than 50% of which was counseling/coordinating care for Port-A-Cath

## 2017-02-24 NOTE — Procedures (Signed)
R IJ Port cathter placement with [place] and fluoroscopy No complication No blood loss. See complete dictation in [facility]   Dillard Cannon MD Main # [phone] Pager  [phone]

## 2017-02-24 NOTE — Telephone Encounter (Signed)
Follow up       Calling to let pharmacist know that the procedure has been scheduled for 03-07-17.

## 2017-02-24 NOTE — Telephone Encounter (Signed)
Will need to move next Coumadin appt to 1 week before procedure to coordinate Lovenox bridging.  Called pt back and her sister answer and stated pt is having a port placement right now. Will try back later.

## 2017-02-25 ENCOUNTER — Telehealth: Payer: Self-pay | Admitting: Hematology and Oncology

## 2017-02-25 ENCOUNTER — Other Ambulatory Visit: Payer: Self-pay | Admitting: Hematology and Oncology

## 2017-02-25 ENCOUNTER — Ambulatory Visit: Payer: Medicare Other

## 2017-02-25 ENCOUNTER — Other Ambulatory Visit: Payer: Self-pay

## 2017-02-25 ENCOUNTER — Ambulatory Visit
Admission: RE | Admit: 2017-02-25 | Discharge: 2017-02-25 | Disposition: A | Discharge status: Home / Self Care | Payer: Medicare Other | Source: Ambulatory Visit | Attending: Radiation Oncology | Admitting: Radiation Oncology

## 2017-02-25 DIAGNOSIS — C8522 Mediastinal (thymic) large B-cell lymphoma, intrathoracic lymph nodes: Secondary | ICD-10-CM

## 2017-02-25 DIAGNOSIS — Z510 Encounter for antineoplastic radiation therapy: Secondary | ICD-10-CM | POA: Diagnosis not present

## 2017-02-25 DIAGNOSIS — C383 Malignant neoplasm of mediastinum, part unspecified: Secondary | ICD-10-CM | POA: Diagnosis not present

## 2017-02-25 DIAGNOSIS — C8332 Diffuse large B-cell lymphoma, intrathoracic lymph nodes: Secondary | ICD-10-CM | POA: Diagnosis not present

## 2017-02-25 MED ORDER — ALLOPURINOL 300 MG PO TABS: 300.0000 mg | ORAL_TABLET | Freq: Every day | ORAL | 3 refills | Status: DC

## 2017-02-25 MED ORDER — LORAZEPAM 0.5 MG PO TABS: 0.5000 mg | ORAL_TABLET | Freq: Every day | ORAL | 0 refills | Status: DC

## 2017-02-25 MED ORDER — PREDNISONE 20 MG PO TABS: 60.0000 mg | ORAL_TABLET | Freq: Every day | ORAL | 3 refills | Status: DC

## 2017-02-25 MED ORDER — ONDANSETRON HCL 8 MG PO TABS: 8.0000 mg | ORAL_TABLET | Freq: Two times a day (BID) | ORAL | 1 refills | Status: DC | PRN

## 2017-02-25 MED ORDER — PROCHLORPERAZINE MALEATE 10 MG PO TABS: 10.0000 mg | ORAL_TABLET | Freq: Four times a day (QID) | ORAL | 6 refills | Status: DC | PRN

## 2017-02-25 MED ORDER — LIDOCAINE-PRILOCAINE 2.5-2.5 % EX CREA: TOPICAL_CREAM | CUTANEOUS | 3 refills | Status: DC

## 2017-02-25 NOTE — Progress Notes (Signed)
Pt called and lvm regarding pt needing surgery to remove kidney stones with Dr.Ottelin. Called pt back to let her know that Dr.Gudena is aware and will need to hold chemo 02/27/17. Dr.Gudena would like to have pt come back 1 week post surgery and start chemo then. Pt also reports that she had her port placed yesterday and complains of discomfort. She had been taking tylenol for pain management. Will call in for emla cream for pt, to use during port access. Pt also would like to have her prednisone and nausea medications called in to [facility] on [address]. Told pt that scheduling will be calling her to give her a new schedule for 03/19/17. She will need to cancel her 6/7 and 6/13 appt with [place]. Dr.Gudena is aware of this. PT verbalized understanding and will call back if she does not hear from scheduling.

## 2017-02-25 NOTE — Telephone Encounter (Signed)
Spoke with patient re appointments for 6/27. Appointments for 6/7 and 6/13 have been cancelled per 6/5 schedule message - patient aware.

## 2017-02-25 NOTE — Telephone Encounter (Signed)
-----   Message from Deboraha Sprang, MD sent at 02/20/2017  7:10 PM EDT ----- Anselmo Pickler sent Kathie Rhodes " this afternoon by way of epic that same she had surgical clearance from our perspective she would need bridging but the most important issue was getting clearance from interventional radiology because of her superior vena cava syndrome. Let  me know if there is something else that I missed on this. Feel free to text me tomorrow

## 2017-02-26 ENCOUNTER — Ambulatory Visit: Payer: Medicare Other

## 2017-02-26 ENCOUNTER — Ambulatory Visit
Admission: RE | Admit: 2017-02-26 | Discharge: 2017-02-26 | Disposition: A | Discharge status: Home / Self Care | Payer: Medicare Other | Source: Ambulatory Visit | Attending: Radiation Oncology | Admitting: Radiation Oncology

## 2017-02-26 DIAGNOSIS — C383 Malignant neoplasm of mediastinum, part unspecified: Secondary | ICD-10-CM | POA: Diagnosis not present

## 2017-02-26 DIAGNOSIS — Z510 Encounter for antineoplastic radiation therapy: Secondary | ICD-10-CM | POA: Diagnosis not present

## 2017-02-26 DIAGNOSIS — C8332 Diffuse large B-cell lymphoma, intrathoracic lymph nodes: Secondary | ICD-10-CM | POA: Diagnosis not present

## 2017-02-26 NOTE — Telephone Encounter (Signed)
Coumadin appt rescheduled for 6/7 to discuss Lovenox bridge.

## 2017-02-27 ENCOUNTER — Ambulatory Visit: Payer: Medicare Other

## 2017-02-27 ENCOUNTER — Other Ambulatory Visit: Payer: Medicare Other

## 2017-02-27 ENCOUNTER — Ambulatory Visit
Admission: RE | Admit: 2017-02-27 | Discharge: 2017-02-27 | Disposition: A | Discharge status: Home / Self Care | Payer: Medicare Other | Source: Ambulatory Visit | Attending: Radiation Oncology | Admitting: Radiation Oncology

## 2017-02-27 ENCOUNTER — Ambulatory Visit (INDEPENDENT_AMBULATORY_CARE_PROVIDER_SITE_OTHER): Payer: Medicare Other | Admitting: *Deleted

## 2017-02-27 DIAGNOSIS — I4891 Unspecified atrial fibrillation: Secondary | ICD-10-CM

## 2017-02-27 DIAGNOSIS — Z510 Encounter for antineoplastic radiation therapy: Secondary | ICD-10-CM | POA: Diagnosis not present

## 2017-02-27 DIAGNOSIS — I6350 Cerebral infarction due to unspecified occlusion or stenosis of unspecified cerebral artery: Secondary | ICD-10-CM | POA: Diagnosis not present

## 2017-02-27 DIAGNOSIS — Z5181 Encounter for therapeutic drug level monitoring: Secondary | ICD-10-CM | POA: Diagnosis not present

## 2017-02-27 DIAGNOSIS — Z7901 Long term (current) use of anticoagulants: Secondary | ICD-10-CM | POA: Diagnosis not present

## 2017-02-27 DIAGNOSIS — C8332 Diffuse large B-cell lymphoma, intrathoracic lymph nodes: Secondary | ICD-10-CM | POA: Diagnosis not present

## 2017-02-27 DIAGNOSIS — C383 Malignant neoplasm of mediastinum, part unspecified: Secondary | ICD-10-CM | POA: Diagnosis not present

## 2017-02-27 LAB — POCT INR: INR: 1.1

## 2017-02-27 MED ORDER — ENOXAPARIN SODIUM 150 MG/ML ~~LOC~~ SOLN: 150.0000 mg | SUBCUTANEOUS | 1 refills | Status: DC

## 2017-02-27 NOTE — Patient Instructions (Addendum)
02/27/17: Continue injecting Lovenox in the fatty tissue everyday at 8am. No Coumadin.  02/28/17: Continue injecting Lovenox in the fatty tissue everyday at 8am. No Coumadin.  03/01/17: Continue injecting Lovenox in the fatty tissue everyday at 8am. No Coumadin.  03/02/17: Continue Lovenox in the fatty tissue everyday at 8am. No Coumadin.  03/03/17: Continue injecting Lovenox in the fatty tissue everyday at 8am. No Coumadin.  03/04/17: Inject Lovenox in the fatty tissue everyday at 8am. No Coumadin.  03/05/17: Inject Lovenox in the fatty tissue everyday at 8 pm. No Coumadin.  03/06/17: No Lovenox & No Coumadin.  03/07/17: Procedure Day - No Lovenox - Resume Coumadin in the evening or as directed by doctor   03/08/17: Resume Lovenox inject in the fatty tissue everyday at 8am and take Coumadin.  03/09/17: Inject Lovenox in the fatty tissue everyday at 8am and take Coumadin.  03/10/17: Inject Lovenox in the fatty tissue everyday at 8am and take Coumadin.  03/11/17: Inject Lovenox in the fatty tissue everyday at 8am and take Coumadin.  03/12/17: Inject Lovenox in the fatty tissue everyday at 8am and take Coumadin.  03/13/17: Inject Lovenox injection in the morning & report Coumadin appt to check INR.

## 2017-02-28 ENCOUNTER — Ambulatory Visit
Admission: RE | Admit: 2017-02-28 | Discharge: 2017-02-28 | Disposition: A | Discharge status: Home / Self Care | Payer: Medicare Other | Source: Ambulatory Visit | Attending: Radiation Oncology | Admitting: Radiation Oncology

## 2017-02-28 DIAGNOSIS — Z510 Encounter for antineoplastic radiation therapy: Secondary | ICD-10-CM | POA: Diagnosis not present

## 2017-02-28 DIAGNOSIS — C8332 Diffuse large B-cell lymphoma, intrathoracic lymph nodes: Secondary | ICD-10-CM | POA: Diagnosis not present

## 2017-02-28 DIAGNOSIS — C383 Malignant neoplasm of mediastinum, part unspecified: Secondary | ICD-10-CM | POA: Diagnosis not present

## 2017-03-03 ENCOUNTER — Ambulatory Visit: Payer: Medicare Other

## 2017-03-03 ENCOUNTER — Ambulatory Visit
Admission: RE | Admit: 2017-03-03 | Discharge: 2017-03-03 | Disposition: A | Discharge status: Home / Self Care | Payer: Medicare Other | Source: Ambulatory Visit | Attending: Radiation Oncology | Admitting: Radiation Oncology

## 2017-03-03 ENCOUNTER — Other Ambulatory Visit: Payer: Self-pay

## 2017-03-03 ENCOUNTER — Encounter (HOSPITAL_COMMUNITY): Payer: Self-pay | Admitting: Nurse Practitioner

## 2017-03-03 ENCOUNTER — Ambulatory Visit (HOSPITAL_COMMUNITY)
Admission: RE | Admit: 2017-03-03 | Discharge: 2017-03-03 | Disposition: A | Discharge status: Home / Self Care | Payer: Medicare Other | Source: Ambulatory Visit | Attending: Nurse Practitioner | Admitting: Nurse Practitioner

## 2017-03-03 VITALS — BP 116/74 | HR 85 | Ht 66.0 in | Wt 229.4 lb

## 2017-03-03 DIAGNOSIS — C383 Malignant neoplasm of mediastinum, part unspecified: Secondary | ICD-10-CM | POA: Diagnosis not present

## 2017-03-03 DIAGNOSIS — Z888 Allergy status to other drugs, medicaments and biological substances status: Secondary | ICD-10-CM | POA: Insufficient documentation

## 2017-03-03 DIAGNOSIS — Z950 Presence of cardiac pacemaker: Secondary | ICD-10-CM | POA: Diagnosis not present

## 2017-03-03 DIAGNOSIS — I481 Persistent atrial fibrillation: Secondary | ICD-10-CM

## 2017-03-03 DIAGNOSIS — Z79899 Other long term (current) drug therapy: Secondary | ICD-10-CM | POA: Diagnosis not present

## 2017-03-03 DIAGNOSIS — Z85850 Personal history of malignant neoplasm of thyroid: Secondary | ICD-10-CM | POA: Diagnosis not present

## 2017-03-03 DIAGNOSIS — Z7901 Long term (current) use of anticoagulants: Secondary | ICD-10-CM | POA: Insufficient documentation

## 2017-03-03 DIAGNOSIS — E039 Hypothyroidism, unspecified: Secondary | ICD-10-CM | POA: Insufficient documentation

## 2017-03-03 DIAGNOSIS — I4819 Other persistent atrial fibrillation: Secondary | ICD-10-CM

## 2017-03-03 DIAGNOSIS — Z853 Personal history of malignant neoplasm of breast: Secondary | ICD-10-CM | POA: Diagnosis not present

## 2017-03-03 DIAGNOSIS — E890 Postprocedural hypothyroidism: Secondary | ICD-10-CM | POA: Insufficient documentation

## 2017-03-03 DIAGNOSIS — Z510 Encounter for antineoplastic radiation therapy: Secondary | ICD-10-CM | POA: Diagnosis not present

## 2017-03-03 DIAGNOSIS — E785 Hyperlipidemia, unspecified: Secondary | ICD-10-CM | POA: Insufficient documentation

## 2017-03-03 DIAGNOSIS — Z8249 Family history of ischemic heart disease and other diseases of the circulatory system: Secondary | ICD-10-CM | POA: Diagnosis not present

## 2017-03-03 DIAGNOSIS — C8590 Non-Hodgkin lymphoma, unspecified, unspecified site: Secondary | ICD-10-CM | POA: Insufficient documentation

## 2017-03-03 DIAGNOSIS — M179 Osteoarthritis of knee, unspecified: Secondary | ICD-10-CM | POA: Diagnosis not present

## 2017-03-03 DIAGNOSIS — Z833 Family history of diabetes mellitus: Secondary | ICD-10-CM | POA: Diagnosis not present

## 2017-03-03 DIAGNOSIS — C8332 Diffuse large B-cell lymphoma, intrathoracic lymph nodes: Secondary | ICD-10-CM | POA: Diagnosis not present

## 2017-03-03 DIAGNOSIS — Z9884 Bariatric surgery status: Secondary | ICD-10-CM | POA: Diagnosis not present

## 2017-03-03 DIAGNOSIS — Z87442 Personal history of urinary calculi: Secondary | ICD-10-CM | POA: Diagnosis not present

## 2017-03-03 DIAGNOSIS — Z8673 Personal history of transient ischemic attack (TIA), and cerebral infarction without residual deficits: Secondary | ICD-10-CM | POA: Diagnosis not present

## 2017-03-03 NOTE — Progress Notes (Signed)
Primary Care Physician: [name], MD Referring Physician:[facility] f/u EP: Kayla Harrison is a 71 y.o. female with a h/o PPM, persistent afib in the afib clinic for f/u of recent hospitalization. She has h/o of  undergoing afib ablation at [facility] in 2009 with recurrent afib and convergent ablation at [facility] in 2014. She had been on amiodarone in the past but was stopped over a year ago since she was maintaining SR. She was recently admitted to [facility] for breathing difficulties. She was found to be in afib with rvr and also to have a new dx of a lung mass. She was reloaded on amiodarone and is here for f/u. She had been on 400 mg amiodarone bid for 10 days. She is in SR. She is being followed by oncology and has been dx with lymphoma and is undergoing radiation and is scheduled to start chemo soon.  F/u in afib clinic 6/11, and is maintaining SR on amiodarone 200 mg daily. She continues with treatment of her lung mass receiving radiation and to start chemo in near future. She is feeling better and breathing easier. Coumadin is being followed closely in the [address] coumadin clinic.   Today, she denies symptoms of palpitations, chest pain, shortness of breath, orthopnea, PND, lower extremity edema, dizziness, presyncope, syncope, or neurologic sequela. The patient is tolerating medications without difficulties and is otherwise without complaint today.   Past Medical History:  Diagnosis Date  . Arthritis    osteoarthritis - knees   . Atrial fibrillation [facility])    ablation- 2x's-- 1st time- [facility], 2nd event at [facility] in 2008. Convergent ablation at [facility] 6/14  . Breast cancer ([place])    Dr Margot Chimes, total thyroidectomy- 1999- for cancer  . Colon polyp    Dr Earlean Shawl  . Complication of anesthesia    Ketamine produces LSD reaction, bright colored nightmarish experience   . Dyslipidemia   . Dysrhythmia    a fib with internal/external ablation, currently NSR  . Hepatitis    [place]patitis   . Hx of thyroid cancer    Dr Forde Dandy  . Hyperlipidemia   . Hypothyroidism   . Morbid obesity ([place])    Status post lap band surgery  . Normal cardiac stress test    2008- cardiac stress/echo  . Pacemaker-Medtronic   . Pneumonia 2010   [facility]- during that event had to be cardioverted   . Renal calculi   . Sinus node dysfunction ([place])    Complicating convergent ablation 6/14  . Stroke [facility])    2003- [place]    Past Surgical History:  Procedure Laterality Date  . ABDOMINAL HYSTERECTOMY  1983  . afib ablation     X 2; [facility] & Dr Caryl Comes  . APPENDECTOMY    . BREAST LUMPECTOMY  2010  . bso  1998  . CARDIOVERSION  10/09/2012   Procedure: CARDIOVERSION;  Surgeon: Minus Breeding, MD;  Location: [place];  Service: Cardiovascular;  Laterality: N/A;  . CARDIOVERSION  10/09/2012   Procedure: CARDIOVERSION;  Surgeon: Minus Breeding, MD;  Location: [facility] ENDOSCOPY;  Service: Cardiovascular;  Laterality: N/A;  [name] gave the ok to add pt to the add on , but we must check to find out if the can add pt on at [phone] ( [phone])  . CARDIOVERSION N/A 11/20/2012   Procedure: CARDIOVERSION;  Surgeon: Fay Records, MD;  Location: [place];  Service: Cardiovascular;  Laterality: N/A;  .  CHOLECYSTECTOMY    . COLONOSCOPY W/ POLYPECTOMY     Dr Earlean Shawl  . convergent    . CYSTOSCOPY N/A 02/06/2015   Procedure: CYSTOSCOPY;  Surgeon: Kathie Rhodes, MD;  Location: [facility];  Service: Urology;  Laterality: N/A;  . CYSTOSCOPY WITH RETROGRADE PYELOGRAM, URETEROSCOPY AND STENT PLACEMENT Right 02/06/2015   Procedure: RETROGRADE PYELOGRAM, RIGHT URETEROSCOPY STENT PLACEMENT;  Surgeon: Kathie Rhodes, MD;  Location: [facility];  Service: Urology;  Laterality: Right;  . EYE SURGERY     cataract surgery  . HOLMIUM LASER APPLICATION N/A 10/02/6043   Procedure: HOLMIUM LASER APPLICATION;  Surgeon: Kathie Rhodes, MD;  Location: [facility];  Service: Urology;  Laterality: N/A;  . IR FLUORO GUIDE  PORT INSERTION RIGHT  02/24/2017  . IR US GUIDE VASC ACCESS RIGHT  02/24/2017  . JOINT REPLACEMENT  04/13/12   Right Knee replacement  . KNEE ARTHROSCOPY     bilateral  . LAPAROSCOPIC GASTRIC BANDING  07/10/2010  . LAPAROTOMY     for ruptured ovary and ovarian artery   . PACEMAKER INSERTION  03/10/13   [facility]  . POCKET REVISION N/A 12/08/2013   Procedure: POCKET REVISION;  Surgeon: Deboraha Sprang, MD;  Location: [facility] CATH LAB;  Service: Cardiovascular;  Laterality: N/A;  . THYROIDECTOMY  1998   Dr Margot Chimes  . TONSILLECTOMY    . TOTAL KNEE ARTHROPLASTY  04/13/2012   Procedure: TOTAL KNEE ARTHROPLASTY;  Surgeon: Rudean Haskell, MD;  Location: [place];  Service: Orthopedics;  Laterality: Right;  . Transabdominal ablation  03/02/13   [facility]  . VIDEO BRONCHOSCOPY WITH ENDOBRONCHIAL ULTRASOUND N/A 02/07/2017   Procedure: VIDEO BRONCHOSCOPY WITH ENDOBRONCHIAL ULTRASOUND;  Surgeon: Marshell Garfinkel, MD;  Location: [place];  Service: Pulmonary;  Laterality: N/A;    Current Outpatient Prescriptions  Medication Sig Dispense Refill  . amiodarone (PACERONE) 200 MG tablet Take 1 tablet by mouth twice a day for 1 week then reduce to 1 tablet daily (Patient taking differently: 1 tablet daily) 60 tablet 0  . cycloSPORINE (RESTASIS) 0.05 % ophthalmic emulsion Place 1 drop into both eyes 2 (two) times daily.    Marland Kitchen enoxaparin (LOVENOX) 150 MG/ML injection Inject 1 mL (150 mg total) into the skin daily. 10 Syringe 1  . furosemide (LASIX) 40 MG tablet Take 40 mg by mouth daily as needed for fluid.     Marland Kitchen levothyroxine (SYNTHROID, LEVOTHROID) 175 MCG tablet Take 175 mcg by mouth daily before breakfast.    . Lifitegrast (XIIDRA) 5 % SOLN Apply 1 drop to eye 2 (two) times daily.    . Multiple Vitamin (MULTIVITAMIN) tablet Take 1 tablet by mouth daily.     . sucralfate (CARAFATE) 1 g tablet Take 1 tablet (1 g total) by mouth 4 (four) times daily. 120 tablet 2  . warfarin (COUMADIN) 5 MG tablet TAKE AS DIRECTED BY  COUMADIN  CLINIC 90 tablet 1  . allopurinol (ZYLOPRIM) 300 MG tablet Take 1 tablet (300 mg total) by mouth daily. (Patient not taking: Reported on 03/03/2017) 30 tablet 3  . lidocaine-prilocaine (EMLA) cream Apply to affected area once (Patient not taking: Reported on 03/03/2017) 30 g 3  . LORazepam (ATIVAN) 0.5 MG tablet Take 1 tablet (0.5 mg total) by mouth at bedtime. (Patient not taking: Reported on 03/03/2017) 30 tablet 0  . ondansetron (ZOFRAN) 8 MG tablet Take 1 tablet (8 mg total) by mouth 2 (two) times daily as needed for refractory nausea / vomiting. Start on day 3 after cyclophosphamide chemotherapy. (Patient not  taking: Reported on 03/03/2017) 30 tablet 1  . predniSONE (DELTASONE) 20 MG tablet Take 3 tablets (60 mg total) by mouth daily. Take on days 1-5 of chemotherapy. (Patient not taking: Reported on 03/03/2017) 30 tablet 3  . prochlorperazine (COMPAZINE) 10 MG tablet Take 1 tablet (10 mg total) by mouth every 6 (six) hours as needed (Nausea or vomiting). (Patient not taking: Reported on 03/03/2017) 30 tablet 6  . traMADol (ULTRAM) 50 MG tablet Take 1-2 tablets (50-100 mg total) by mouth every 6 (six) hours as needed. (Patient not taking: Reported on 02/18/2017) 60 tablet 0   No current facility-administered medications for this encounter.     Allergies  Allergen Reactions  . Tikosyn [Dofetilide]     Prolonged QT interval Prolonged QT interval  . Xarelto [Rivaroxaban]     Nose Bleed X 6 hrs ; packing in ER  . Epinephrine Other (See Comments) and Rash    Dental form only (liquid). Patient stated she will become "out of it" she can hear you but cannot respond in normal fashion.  Marland Kitchen Ketamine     Hallucinations  . Eliquis [Apixaban] Other (See Comments)    BLEEDING IN URINE  . Adhesive [Tape] Rash    Paper tape as well    Social History   Social History  . Marital status: Single    Spouse name: N/A  . Number of children: 0  . Years of education: N/A   Occupational History  . [employer] Retired   Social History Main Topics  . Smoking status: Never Smoker  . Smokeless tobacco: Never Used  . Alcohol use No  . Drug use: No  . Sexual activity: Not on file   Other Topics Concern  . Not on file   Social History Narrative   SINGLE    NO CHILDREN   NEVER SMOKED   EXERCISE 3 X WK          Family History  Problem Relation Age of Onset  . Cancer Father        COLON  . Heart disease Father 34       MI @autopsy   . Diabetes Sister   . Diabetes Brother   . Diabetes Paternal Aunt   . Diabetes Paternal Grandmother     ROS- All systems are reviewed and negative except as per the HPI above  Physical Exam: Vitals:   03/03/17 1153  BP: 116/74  Pulse: 85  Weight: 229 lb 6.4 oz (104.1 kg)  Height: 5\' 6"  (1.676 m)   Wt Readings from Last 3 Encounters:  03/03/17 229 lb 6.4 oz (104.1 kg)  02/24/17 231 lb 6.4 oz (105 kg)  02/18/17 236 lb 6.4 oz (107.2 kg)    Labs: Lab Results  Component Value Date   NA 138 02/08/2017   K 4.1 02/08/2017   CL 106 02/08/2017   CO2 24 02/08/2017   GLUCOSE 115 (H) 02/08/2017   BUN 15 02/08/2017   CREATININE 1.01 (H) 02/08/2017   CALCIUM 8.9 02/08/2017   PHOS 3.9 04/19/2013   MG 2.1 04/19/2013   Lab Results  Component Value Date   INR 1.1 02/27/2017   Lab Results  Component Value Date   CHOL 200 07/26/2013   HDL 51.80 07/26/2013   LDLCALC 116 (H) 07/26/2013   TRIG 162.0 (H) 07/26/2013     GEN- The patient is well appearing, alert and oriented x 3 today.   Head- normocephalic, atraumatic Eyes-  Sclera clear, conjunctiva  pink Ears- hearing intact Oropharynx- clear Neck- supple, no JVP Lymph- no cervical lymphadenopathy Lungs- Clear to ausculation bilaterally, normal work of breathing Heart- Regular rate and rhythm, no murmurs, rubs or gallops, PMI not laterally displaced GI- soft, NT, ND, + BS Extremities- no clubbing, cyanosis, or edema MS- no significant deformity or  atrophy Skin- no rash or lesion Psych- euthymic mood, full affect Neuro- strength and sensation are intact  EKG-NSR at 80 bpm, pr int 184 ms, qrs int 80 ms, qtc 454 ms Epic records reviewed    Assessment and Plan: 1. Persistent afib In SR Continue amiodarone 200 mg daily Continue close f/u with coumadin clinic with chadsvasc score of at least 2  2. Lymphoma/lung mass Per oncology  F/u 7/5 for remote check and 8/1 with Dr. Alta Corning clinic as needed  Kayla Harrison, [facility] [address] [place], [place] [address] [phone]

## 2017-03-04 ENCOUNTER — Encounter: Payer: Self-pay | Admitting: Radiation Oncology

## 2017-03-04 ENCOUNTER — Ambulatory Visit
Admission: RE | Admit: 2017-03-04 | Discharge: 2017-03-04 | Disposition: A | Discharge status: Home / Self Care | Payer: Medicare Other | Source: Ambulatory Visit | Attending: Radiation Oncology | Admitting: Radiation Oncology

## 2017-03-04 DIAGNOSIS — Z510 Encounter for antineoplastic radiation therapy: Secondary | ICD-10-CM | POA: Diagnosis not present

## 2017-03-04 DIAGNOSIS — C8332 Diffuse large B-cell lymphoma, intrathoracic lymph nodes: Secondary | ICD-10-CM | POA: Diagnosis not present

## 2017-03-04 DIAGNOSIS — C383 Malignant neoplasm of mediastinum, part unspecified: Secondary | ICD-10-CM | POA: Diagnosis not present

## 2017-03-04 NOTE — Progress Notes (Signed)
  Radiation Oncology         [phone] ________________________________  Name: Kayla Harrison MRN: [id number]  Date: 02/13/2017  DOB: [birth year]  COMPLEX SIMULATION  NOTE  Diagnosis: lymphoma   Narrative Given the patient's diagnosis of lymphoma, the patient's plan has been altered to more completely cover the entire high risk region. To accomplish this, an additional 4 treatment fields/complex treatment devices have been designed. Each of these fields will be used daily. I have requested an isodose plan.  Dose:  The patient has already received 9 gray in 3 fractions. With the new 4 field plan, she will receive an additional 26 gray in 13 fractions.  ________________________________ ------------------------------------------------  Jodelle Gross, MD, PhD

## 2017-03-04 NOTE — Progress Notes (Signed)
  Radiation Oncology         [phone] ________________________________  Name: Kayla Harrison MRN: [id number]  Date: 02/06/2017  DOB: 12/30/1945  SIMULATION AND TREATMENT PLANNING NOTE  DIAGNOSIS:     ICD-10-CM   1. 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 Mediastinal mass J98.59   3. 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  Identity was confirmed.  All relevant records and images related to the planned course of therapy were reviewed.   Written consent to proceed with treatment was confirmed which was freely given after reviewing the details related to the planned course of therapy had been reviewed with the patient.  Then, the patient was set-up in a stable reproducible  supine position for radiation therapy.  CT images were obtained.  Surface markings were placed.    The CT images were loaded into the planning software.  Then the target and avoidance structures were contoured.  Treatment planning then occurred.  The radiation prescription was entered and confirmed.  A total of 3 complex treatment devices were fabricated which relate to the designed radiation treatment fields. Each of these customized fields/ complex treatment devices will be used on a daily basis during the radiation course. I have requested : 3D Simulation  I have requested a DVH of the following structures: target, lungs, cord.   PLAN:  The patient will receive 30 Gy in 10 fractions.  ________________________________   Jodelle Gross, MD, PhD

## 2017-03-04 NOTE — Addendum Note (Signed)
Encounter addended by: Kyung Rudd, MD on: 03/04/2017 10:12 AM<BR>    Actions taken: Visit diagnoses modified, Sign clinical note

## 2017-03-05 ENCOUNTER — Telehealth: Payer: Self-pay | Admitting: *Deleted

## 2017-03-05 ENCOUNTER — Ambulatory Visit: Payer: Medicare Other | Admitting: Hematology and Oncology

## 2017-03-05 ENCOUNTER — Other Ambulatory Visit: Payer: Medicare Other

## 2017-03-05 ENCOUNTER — Encounter (HOSPITAL_BASED_OUTPATIENT_CLINIC_OR_DEPARTMENT_OTHER): Payer: Self-pay | Admitting: *Deleted

## 2017-03-05 NOTE — Progress Notes (Signed)
Pt instructed npo pmn 6/14 x amiodarone, synthroid w sip of water.  To [facility] 6/15 @ 0800.  Needs kub on arrival.  Labs, ekg, cxr in epic.

## 2017-03-05 NOTE — Telephone Encounter (Signed)
CALLED PATIENT TO INFORM OF FU APPT. WITH ALISON PERKINS ON 04/30/17 @ 10 AM , LVM FOR A RETURN CALL

## 2017-03-05 NOTE — Progress Notes (Signed)
  Radiation Oncology         [phone] ________________________________  Name: Kayla Harrison MRN: [id number]  Date: 03/04/2017  DOB: Oct 05, 1945  End of Treatment Note  Diagnosis:   Non-Hodgkin B-cell Lymphoma of the intrathoracic lymph nodes  Indication for treatment:  Palliative  Radiation treatment dates:   02/10/17 - 03/04/17  Site/dose:    1) Mediastinum: 9 Gy in 3 fractions 2) Mediastinal boost: 26 Gy in 13 fractions  Beams/energy:    1) 3D // 6X Photon 2) Isodose Plan // 6X Photon  Narrative: The patient tolerated radiation treatment relatively well. The patient's original treatment fields were expanded to better cover some of the smaller nodes in the mediastinum given her diagnosis. Towards the end of treatment she complained of pain from her right chest port a cath site with the pain increasing when she laid down. She also had pain in her chest that radiated to her shoulders after she ate. She took Carafate TID without relief. She lost some weight towards the end of treatment and had a syncopal feeling the morning of 02/28/17. Orthostatic vitals were taken and she had a dry cough.  Plan: The patient has completed radiation treatment. The patient will return to radiation oncology clinic for routine followup in one month. I advised them to call or return sooner if they have any questions or concerns related to their recovery or treatment. The patient is scheduled to start chemo on 03/19/17.  ------------------------------------------------  Jodelle Gross, MD, PhD  This document serves as a record of services personally performed by Kyung Rudd, MD. It was created on his behalf by Darcus Austin, a trained medical scribe. The creation of this record is based on the scribe's personal observations and the provider's statements to them. This document has been checked and approved by the attending provider.

## 2017-03-05 NOTE — H&P (Signed)
HPI: Kayla Harrison is a 71 year-old female with a right ureteral calculus.  The patient's stone is one her right side. She first noticed the symptoms 02/19/2017. This is not her first kidney stone. There is not a history of calculus disease in the family. She is not currently having flank pain, back pain, groin pain, nausea, vomiting, fever or chills. She does not have a burning sensation when she urinates. She has not caught a stone in her urine strainer since her symptoms began.   She has had Ureteroscopy for treatment of her stones in the past.   02/20/17: She underwent a PET scan on 02/19/17 which revealed right hydronephrosis with a 1.4 cm stone located in the renal pelvis on the right hand side that was not causing obstruction and had Hounsfield units of 1300. There was a 9 mm stone in the proximal right ureter with Hounsfield units of 800. No other right renal calculi were noted. There were stones seen in the periphery of the left kidney as well.   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 She subsequently undergone radiation and this has helped significantly. She is scheduled to undergo chemotherapy eventually. One week ago she had an episode of gross hematuria but she reports she is not having any flank pain and no change in her voiding pattern.     ALLERGIES: EPINEPHrine HCl SOLN Xarelto TABS    MEDICATIONS: Amiodarone Hcl 200 mg tablet 1 Oral Daily  Coumadin 5 mg tablet 0 Oral  Flonase 50 mcg/actuation spray, suspension Nasal  Furosemide 40 mg tablet Oral  Levothyroxine Sodium 150 MCG Oral Tablet Oral  Multi Vitamin/Minerals TABS Oral  Pravastatin Sodium 40 mg tablet Oral  Restasis 0.05 % dropperette, single-use drop dispenser Ophthalmic  Vitamin D TABS Oral     GU PSH: Cysto Uretero Lithotripsy - 2016 Cystoscopy Insert Stent - 2016 Hysterectomy Unilat SO - 2008 Ovary Removal  Partial or Total - 2008      PSH Notes: Cystoscopy With Ureteroscopy With Lithotripsy, Cystoscopy With Insertion Of Ureteral Stent Right, Pacemaker Placement, Lung Surgery, Pacemaker Placement, Heart Surgery, Breast Surgery Lumpectomy, Laparosc Gastric Restrictive Proc By Adjustable Gastric Band, Knee Replacement, Oophorectomy - Bilateral (Removal Of Both Ovaries), Cholecystectomy, Thyroid Surgery Total Thyroidectomy, Hysterectomy, Tonsillectomy   NON-GU PSH: Cholecystectomy (open) - 2008 Lung Surgery (Unspecified) - 2014 Remove Breast Lesion - 2014 Remove Thyroid - 2008 Remove Tonsils - 2008 Revise Knee Joint - 2014    GU PMH: Hydronephrosis Unspec, Hydronephrosis, right - 05/18/2015 Renal calculus, Bilateral kidney stones - 05/18/2015 Urinary Retention, Unspec, Incomplete bladder emptying - 2014      PMH Notes: Bilateral nephrolithiasis: CT scan 3/07 - bilateral nephrolithiasis. He was seen by Dr. Terance Hart for a right UPJ stone but failed to followup.  CT scan 09/10/12 - bilateral nephrolithiasis with a 14 x 9 mm stone in the right renal pelvis without obstruction (1400 Hounsfield units). A 4 mm stone was noted on the floor of the bladder.  KUB 02/18/13 revealed the large stone in her right kidney appears to be overlying the lower pole rather than renal pelvis as well as a second and possibly third stone on the right-hand side. On the left-hand side I can definitely see 2 stones in the lower pole.  CT scan 12/15/14 revealed bilateral nonobstructing renal calculi as well as a 6.5 mm stone in her distal right ureter.  Right ureteroscopy and  laser lithotripsy 02/06/15  She has a large right renal pelvic stone and she has elected to not undergo treatment unless absolutely necessary.  Stone analysis: Calcium oxalate     NON-GU PMH: Encounter for general adult medical examination without abnormal findings, Encounter for preventive health examination - 2016 Obesity, Morbid Obesity - 2014 Personal  history of other diseases of the circulatory system, History of hypertension - 2014 Personal history of other endocrine, nutritional and metabolic disease, History of hypothyroidism - 2014, History of hypercholesterolemia, - 2014 Unspecified atrial fibrillation, Atrial Fibrillation - 2014    FAMILY HISTORY: Coronary Artery Disease - Father, Grandfather Diabetes - Grandfather, Father Urologic Disorder - Father   SOCIAL HISTORY: Marital Status: Single Current Smoking Status: Patient has never smoked.   Tobacco Use Assessment Completed: Used Tobacco in last 30 days? Drinks 1 caffeinated drink per day.     Notes: Never A Smoker, Occupation:, Tobacco Use, Caffeine Use, Marital History - Single, Alcohol Use   REVIEW OF SYSTEMS:    GU Review Female:   Patient denies frequent urination, hard to postpone urination, burning /pain with urination, get up at night to urinate, leakage of urine, stream starts and stops, trouble starting your stream, have to strain to urinate, and being pregnant.  Gastrointestinal (Upper):   Patient denies nausea, vomiting, and indigestion/ heartburn.  Gastrointestinal (Lower):   Patient denies diarrhea and constipation.  Constitutional:   Patient denies fever, night sweats, weight loss, and fatigue.  Skin:   Patient denies skin rash/ lesion and itching.  Eyes:   Patient denies blurred vision and double vision.  Ears/ Nose/ Throat:   Patient denies sore throat and sinus problems.  Hematologic/Lymphatic:   Patient reports easy bruising. Patient denies swollen glands.  Cardiovascular:   Patient denies leg swelling and chest pains.  Respiratory:   Patient reports cough and shortness of breath.   Endocrine:   Patient denies excessive thirst.  Musculoskeletal:   Patient denies back pain and joint pain.  Neurological:   Patient denies headaches and dizziness.  Psychologic:   Patient denies depression and anxiety.   VITAL SIGNS:    Weight 230 lb / 104.33 kg  Height 66 in  / 167.64 cm  BP 124/66 mmHg  Pulse 79 /min  BMI 37.1 kg/m   MULTI-SYSTEM PHYSICAL EXAMINATION:    Constitutional: Obese. No physical deformities. Normally developed. Good grooming.   Gastrointestinal: Obese abdomen. No mass, no tenderness, no rigidity.      PAST DATA REVIEWED:  Source Of History:  Patient  Lab Test Review:   BUN/Creatinine, Calcium  Records Review:   Previous Patient Records, POC Tool  X-Ray Review: PET Scan: Reviewed Films. Reviewed Report. Discussed With Patient.    Notes:                     Her creatinine on 02/08/17 was normal wall 1.01. Her serum calcium at that time was normal at 0.89.   PROCEDURES:          Urinalysis w/Scope Dipstick Dipstick Cont'd Micro  Color: Yellow Bilirubin: Neg WBC/hpf: 10 - 20/hpf  Appearance: Cloudy Ketones: Neg RBC/hpf: 40 - 60/hpf  Specific Gravity: 1.025 Blood: 3+ Bacteria: Few (10-25/hpf)  pH: 6.0 Protein: 3+ Cystals: NS (Not Seen)  Glucose: Neg Urobilinogen: 0.2 Casts: NS (Not Seen)    Nitrites: Neg Trichomonas: Not Present    Leukocyte Esterase: 1+ Mucous: Not Present      Epithelial Cells: 0 - 5/hpf  Yeast: NS (Not Seen)      Sperm: Not Present    ASSESSMENT/PLAN: Neurosurgeons are coming down     ICD-10 Details  1 GU:   Ureteral calculus - N20.1 Right, Acute - We discussed the management of urinary stones. These options include observation, ureteroscopy, shockwave lithotripsy, and PCNL. We discussed which options are relevant to these particular stones. We discussed the natural history of stones as well as the complications of untreated stones and the impact on quality of life without treatment as well as with each of the above listed treatments. We also discussed the efficacy of each treatment in its ability to clear the stone burden. With any of these management options I discussed the signs and symptoms of infection and the need for emergent treatment should these be experienced. For each option we discussed the  ability of each procedure to clear the patient of their stone burden. For observation I described the risks which include but are not limited to silent renal damage, life-threatening infection, need for emergent surgery, failure to pass stone, and pain. For ureteroscopy I described the risks which include heart attack, stroke, pulmonary embolus, death, bleeding, infection, damage to contiguous structures, positioning injury, ureteral stricture, ureteral avulsion, ureteral injury, need for ureteral stent, inability to perform ureteroscopy, need for an interval procedure, inability to clear stone burden, stent discomfort and pain. For shockwave lithotripsy I described the risks which include arrhythmia, kidney contusion, kidney hemorrhage, need for transfusion, long-term risk of diabetes or hypertension, back discomfort, flank ecchymosis, flank abrasion, inability to break up stone, inability to pass stone fragments, Steinstrasse, infection associated with obstructing stones, need for different surgical procedure and possible need for repeat shockwave lithotripsy.   2   Renal calculus - N20.0 Bilateral, She has a right renal calculus and this is not causing obstruction. It has been present and essentially is unchanged since 2013.  3   Ureteral obstruction secondary to calculous - N13.2 Right, Acute - Right ureter is obstructed by her stone and she is going to be undergoing chemotherapy so we discussed the need to get her ureter unobstructed in order to maximize her renal function for her chemotherapy.          Notes:   She cannot have lithotripsy so we discussed ureteroscopy and laser lithotripsy of her stone. She has undergone this in the past. She desires to have the obstructing stone treated but nothing further if possible.   She takes Coumadin has been able to come off of it in the past however she does need Lovenox bridging.

## 2017-03-06 ENCOUNTER — Other Ambulatory Visit: Payer: Self-pay | Admitting: Internal Medicine

## 2017-03-06 MED ORDER — AMIODARONE HCL 200 MG PO TABS: 200.0000 mg | ORAL_TABLET | Freq: Every day | ORAL | 3 refills | Status: DC

## 2017-03-06 NOTE — Telephone Encounter (Signed)
New Message     *STAT* If patient is at the pharmacy, call can be transferred to refill team.   1. Which medications need to be refilled? (please list name of each medication and dose if known)  amiodarone (PACERONE) 200 MG tablet Take 1 tablet by mouth twice a day for 1 week then reduce to 1 tablet daily Patient taking differently: Take 200 mg by mouth daily. 1 tablet daily     2. Which pharmacy/location (including street and city if local pharmacy) is medication to be sent to? [facility] - [address]   3. Do they need a 30 day or 90 day supply?  [place]

## 2017-03-06 NOTE — Progress Notes (Signed)
REVIEWED PT CHART W/ DR MASSAGEE MDA,  OK TO PROCEED.

## 2017-03-07 ENCOUNTER — Ambulatory Visit (HOSPITAL_BASED_OUTPATIENT_CLINIC_OR_DEPARTMENT_OTHER): Payer: Medicare Other | Admitting: Anesthesiology

## 2017-03-07 ENCOUNTER — Encounter (HOSPITAL_BASED_OUTPATIENT_CLINIC_OR_DEPARTMENT_OTHER): Payer: Self-pay

## 2017-03-07 ENCOUNTER — Encounter (HOSPITAL_BASED_OUTPATIENT_CLINIC_OR_DEPARTMENT_OTHER)
Admission: RE | Disposition: A | Discharge status: Home / Self Care | Payer: Self-pay | Source: Ambulatory Visit | Attending: Urology

## 2017-03-07 ENCOUNTER — Ambulatory Visit (HOSPITAL_COMMUNITY): Payer: Medicare Other

## 2017-03-07 ENCOUNTER — Other Ambulatory Visit (HOSPITAL_COMMUNITY): Payer: Self-pay | Admitting: Interventional Radiology

## 2017-03-07 ENCOUNTER — Ambulatory Visit (HOSPITAL_BASED_OUTPATIENT_CLINIC_OR_DEPARTMENT_OTHER)
Admission: RE | Admit: 2017-03-07 | Discharge: 2017-03-07 | Disposition: A | Discharge status: Home / Self Care | Payer: Medicare Other | Source: Ambulatory Visit | Attending: Urology | Admitting: Urology

## 2017-03-07 DIAGNOSIS — C8592 Non-Hodgkin lymphoma, unspecified, intrathoracic lymph nodes: Secondary | ICD-10-CM | POA: Insufficient documentation

## 2017-03-07 DIAGNOSIS — I739 Peripheral vascular disease, unspecified: Secondary | ICD-10-CM | POA: Diagnosis not present

## 2017-03-07 DIAGNOSIS — Z8619 Personal history of other infectious and parasitic diseases: Secondary | ICD-10-CM | POA: Diagnosis not present

## 2017-03-07 DIAGNOSIS — Z7951 Long term (current) use of inhaled steroids: Secondary | ICD-10-CM | POA: Diagnosis not present

## 2017-03-07 DIAGNOSIS — N202 Calculus of kidney with calculus of ureter: Secondary | ICD-10-CM | POA: Diagnosis not present

## 2017-03-07 DIAGNOSIS — N201 Calculus of ureter: Secondary | ICD-10-CM | POA: Diagnosis not present

## 2017-03-07 DIAGNOSIS — Z950 Presence of cardiac pacemaker: Secondary | ICD-10-CM | POA: Diagnosis not present

## 2017-03-07 DIAGNOSIS — Z79899 Other long term (current) drug therapy: Secondary | ICD-10-CM | POA: Diagnosis not present

## 2017-03-07 DIAGNOSIS — Z87442 Personal history of urinary calculi: Secondary | ICD-10-CM | POA: Diagnosis not present

## 2017-03-07 DIAGNOSIS — E039 Hypothyroidism, unspecified: Secondary | ICD-10-CM | POA: Insufficient documentation

## 2017-03-07 DIAGNOSIS — N132 Hydronephrosis with renal and ureteral calculous obstruction: Secondary | ICD-10-CM | POA: Diagnosis not present

## 2017-03-07 DIAGNOSIS — I509 Heart failure, unspecified: Secondary | ICD-10-CM | POA: Diagnosis not present

## 2017-03-07 DIAGNOSIS — Z923 Personal history of irradiation: Secondary | ICD-10-CM | POA: Diagnosis not present

## 2017-03-07 DIAGNOSIS — Z466 Encounter for fitting and adjustment of urinary device: Secondary | ICD-10-CM | POA: Diagnosis not present

## 2017-03-07 DIAGNOSIS — Z7901 Long term (current) use of anticoagulants: Secondary | ICD-10-CM | POA: Diagnosis not present

## 2017-03-07 DIAGNOSIS — N200 Calculus of kidney: Secondary | ICD-10-CM | POA: Diagnosis not present

## 2017-03-07 DIAGNOSIS — I110 Hypertensive heart disease with heart failure: Secondary | ICD-10-CM | POA: Diagnosis not present

## 2017-03-07 DIAGNOSIS — Z6836 Body mass index (BMI) 36.0-36.9, adult: Secondary | ICD-10-CM | POA: Insufficient documentation

## 2017-03-07 DIAGNOSIS — I4891 Unspecified atrial fibrillation: Secondary | ICD-10-CM | POA: Diagnosis not present

## 2017-03-07 DIAGNOSIS — G4730 Sleep apnea, unspecified: Secondary | ICD-10-CM | POA: Diagnosis not present

## 2017-03-07 DIAGNOSIS — T148XXA Other injury of unspecified body region, initial encounter: Secondary | ICD-10-CM

## 2017-03-07 HISTORY — DX: Personal history of other specified conditions: Z87.898

## 2017-03-07 HISTORY — PX: HOLMIUM LASER APPLICATION: SHX5852

## 2017-03-07 HISTORY — PX: CYSTOSCOPY WITH RETROGRADE PYELOGRAM, URETEROSCOPY AND STENT PLACEMENT: SHX5789

## 2017-03-07 HISTORY — DX: Malignant neoplasm of lower lobe, unspecified bronchus or lung: C34.30

## 2017-03-07 HISTORY — DX: Personal history of other diseases of the digestive system: Z87.19

## 2017-03-07 HISTORY — DX: Personal history of urinary calculi: Z87.442

## 2017-03-07 HISTORY — DX: Personal history of other diseases of the respiratory system: Z87.09

## 2017-03-07 SURGERY — CYSTOURETEROSCOPY, WITH RETROGRADE PYELOGRAM AND STENT INSERTION
Anesthesia: General | Site: Bladder | Laterality: Right

## 2017-03-07 MED ORDER — LIDOCAINE HCL (CARDIAC) 20 MG/ML IV SOLN
INTRAVENOUS | Status: DC | PRN
  Administered 2017-03-07: 100 mg via INTRAVENOUS

## 2017-03-07 MED ORDER — SUCCINYLCHOLINE CHLORIDE 20 MG/ML IJ SOLN
INTRAMUSCULAR | Status: DC | PRN
  Administered 2017-03-07: 100 mg via INTRAVENOUS

## 2017-03-07 MED ORDER — PHENYLEPHRINE HCL 10 MG/ML IJ SOLN
INTRAMUSCULAR | Status: DC | PRN
  Administered 2017-03-07: 40 ug via INTRAVENOUS

## 2017-03-07 MED ORDER — EPHEDRINE 5 MG/ML INJ
INTRAVENOUS | Status: AC
Start: 2017-03-07 — End: 2017-03-07
  Filled 2017-03-07: qty 10

## 2017-03-07 MED ORDER — PROMETHAZINE HCL 25 MG/ML IJ SOLN
6.2500 mg | INTRAMUSCULAR | Status: DC | PRN
  Filled 2017-03-07: qty 1

## 2017-03-07 MED ORDER — LACTATED RINGERS IV SOLN
INTRAVENOUS | Status: DC
  Administered 2017-03-07 (×2): via INTRAVENOUS
  Filled 2017-03-07: qty 1000

## 2017-03-07 MED ORDER — FENTANYL CITRATE (PF) 100 MCG/2ML IJ SOLN
INTRAMUSCULAR | Status: DC | PRN
  Administered 2017-03-07: 50 ug via INTRAVENOUS
  Administered 2017-03-07 (×2): 25 ug via INTRAVENOUS

## 2017-03-07 MED ORDER — DEXAMETHASONE SODIUM PHOSPHATE 4 MG/ML IJ SOLN
INTRAMUSCULAR | Status: DC | PRN
  Administered 2017-03-07: 10 mg via INTRAVENOUS

## 2017-03-07 MED ORDER — OXYCODONE HCL 10 MG PO TABS: 10.0000 mg | ORAL_TABLET | ORAL | 0 refills | Status: DC | PRN

## 2017-03-07 MED ORDER — CIPROFLOXACIN IN D5W 400 MG/200ML IV SOLN
INTRAVENOUS | Status: AC
  Filled 2017-03-07: qty 200

## 2017-03-07 MED ORDER — ONDANSETRON HCL 4 MG/2ML IJ SOLN
INTRAMUSCULAR | Status: DC | PRN
  Administered 2017-03-07: 4 mg via INTRAVENOUS

## 2017-03-07 MED ORDER — DEXAMETHASONE SODIUM PHOSPHATE 10 MG/ML IJ SOLN
INTRAMUSCULAR | Status: AC
  Filled 2017-03-07: qty 1

## 2017-03-07 MED ORDER — CIPROFLOXACIN IN D5W 400 MG/200ML IV SOLN
400.0000 mg | INTRAVENOUS | Status: AC
  Administered 2017-03-07: 400 mg via INTRAVENOUS
  Filled 2017-03-07: qty 200

## 2017-03-07 MED ORDER — PROPOFOL 10 MG/ML IV BOLUS
INTRAVENOUS | Status: DC | PRN
  Administered 2017-03-07: 50 mg via INTRAVENOUS
  Administered 2017-03-07: 150 mg via INTRAVENOUS

## 2017-03-07 MED ORDER — LIDOCAINE 2% (20 MG/ML) 5 ML SYRINGE
INTRAMUSCULAR | Status: AC
  Filled 2017-03-07: qty 5

## 2017-03-07 MED ORDER — SODIUM CHLORIDE 0.9 % IR SOLN
Status: DC | PRN
  Administered 2017-03-07: 3000 mL via INTRAVESICAL
  Administered 2017-03-07: 1000 mL via INTRAVESICAL

## 2017-03-07 MED ORDER — ONDANSETRON HCL 4 MG/2ML IJ SOLN
INTRAMUSCULAR | Status: AC
  Filled 2017-03-07: qty 2

## 2017-03-07 MED ORDER — FENTANYL CITRATE (PF) 100 MCG/2ML IJ SOLN
INTRAMUSCULAR | Status: AC
  Filled 2017-03-07: qty 2

## 2017-03-07 MED ORDER — HYDROMORPHONE HCL 1 MG/ML IJ SOLN
0.2500 mg | INTRAMUSCULAR | Status: DC | PRN
  Filled 2017-03-07: qty 0.5

## 2017-03-07 MED ORDER — EPHEDRINE SULFATE 50 MG/ML IJ SOLN
INTRAMUSCULAR | Status: DC | PRN
  Administered 2017-03-07 (×3): 10 mg via INTRAVENOUS

## 2017-03-07 MED ORDER — PHENAZOPYRIDINE HCL 100 MG PO TABS
ORAL_TABLET | ORAL | Status: AC
  Filled 2017-03-07: qty 2

## 2017-03-07 MED ORDER — PHENAZOPYRIDINE HCL 200 MG PO TABS: 200.0000 mg | ORAL_TABLET | Freq: Three times a day (TID) | ORAL | 0 refills | Status: DC | PRN

## 2017-03-07 MED ORDER — PROPOFOL 10 MG/ML IV BOLUS
INTRAVENOUS | Status: AC
  Filled 2017-03-07: qty 20

## 2017-03-07 MED ORDER — IOHEXOL 300 MG/ML  SOLN
INTRAMUSCULAR | Status: DC | PRN
  Administered 2017-03-07: 10 mL via URETHRAL

## 2017-03-07 MED ORDER — PHENAZOPYRIDINE HCL 200 MG PO TABS
200.0000 mg | ORAL_TABLET | Freq: Three times a day (TID) | ORAL | Status: AC
  Administered 2017-03-07: 200 mg via ORAL
  Filled 2017-03-07: qty 1

## 2017-03-07 SURGICAL SUPPLY — 35 items
BAG DRAIN URO-CYSTO SKYTR STRL (DRAIN) IMPLANT
BASKET LASER NITINOL 1.9FR (BASKET) IMPLANT
BASKET STNLS GEMINI 4WIRE 3FR (BASKET) IMPLANT
BASKET STONE 1.7 NGAGE (UROLOGICAL SUPPLIES) ×6 IMPLANT
BASKET ZERO TIP NITINOL 2.4FR (BASKET) IMPLANT
CATH INTERMIT  6FR 70CM (CATHETERS) ×3 IMPLANT
CATH URET 5FR 28IN CONE TIP (BALLOONS)
CATH URET 5FR 70CM CONE TIP (BALLOONS) IMPLANT
CLOTH BEACON ORANGE TIMEOUT ST (SAFETY) ×3 IMPLANT
ELECT REM PT RETURN 9FT ADLT (ELECTROSURGICAL)
ELECTRODE REM PT RTRN 9FT ADLT (ELECTROSURGICAL) IMPLANT
FIBER LASER FLEXIVA 365 (UROLOGICAL SUPPLIES) IMPLANT
FIBER LASER FLEXIVA 550 (UROLOGICAL SUPPLIES) IMPLANT
FIBER LASER TRAC TIP (UROLOGICAL SUPPLIES) IMPLANT
GLOVE BIO SURGEON STRL SZ8 (GLOVE) ×3 IMPLANT
GOWN STRL REUS W/ TWL LRG LVL3 (GOWN DISPOSABLE) ×2 IMPLANT
GOWN STRL REUS W/ TWL XL LVL3 (GOWN DISPOSABLE) ×2 IMPLANT
GOWN STRL REUS W/TWL LRG LVL3 (GOWN DISPOSABLE) ×1
GOWN STRL REUS W/TWL XL LVL3 (GOWN DISPOSABLE) ×1
GUIDEWIRE 0.038 PTFE COATED (WIRE) IMPLANT
GUIDEWIRE ANG ZIPWIRE 038X150 (WIRE) IMPLANT
GUIDEWIRE STR DUAL SENSOR (WIRE) ×3 IMPLANT
IV NS IRRIG 3000ML ARTHROMATIC (IV SOLUTION) ×6 IMPLANT
KIT BALLIN UROMAX 15FX10 (LABEL) IMPLANT
KIT BALLN UROMAX 15FX4 (MISCELLANEOUS) IMPLANT
KIT BALLN UROMAX 26 75X4 (MISCELLANEOUS)
KIT RM TURNOVER CYSTO AR (KITS) ×3 IMPLANT
MANIFOLD NEPTUNE II (INSTRUMENTS) IMPLANT
PACK CYSTO (CUSTOM PROCEDURE TRAY) ×3 IMPLANT
SET HIGH PRES BAL DIL (LABEL)
SHEATH ACCESS URETERAL 24CM (SHEATH) ×3 IMPLANT
SHEATH ACCESS URETERAL 38CM (SHEATH) IMPLANT
STENT URET 6FRX24 CONTOUR (STENTS) ×3 IMPLANT
TUBE CONNECTING 12X1/4 (SUCTIONS) IMPLANT
WATER STERILE IRR 3000ML UROMA (IV SOLUTION) IMPLANT

## 2017-03-07 NOTE — Progress Notes (Signed)
Patient ID: Kayla Harrison, female   DOB: Mar 05, 1946, 71 y.o.   MRN: [id number] Pt seen today while in Day Surgery for port a cath check.She underwent cysto/renal stone extraction.  Rt IJ port placed 6/4. Pt states she has had some pain at port site since then. She was on coumadin as OP but converted to lovenox periop. She denies fever/chills. The port has not been accessed since placement. On exam there is some edema/ecchymoses at port pocket and surrounding soft tissues,most likely representing small hematoma. Recommend holding on access of port for now until area heals/body reabsorbs hematoma;site is mildly tender to palpation. There is no drainage or skin breakdown at insertion site. Pt will be scheduled to f/u with Dr. Vernard Gambles on 6/19 in IR suite.

## 2017-03-07 NOTE — Discharge Instructions (Addendum)
Post Anesthesia Home Care Instructions  Activity: Get plenty of rest for the remainder of the day. A responsible individual must stay with you for 24 hours following the procedure.  For the next 24 hours, DO NOT: -Drive a car -Paediatric nurse -Drink alcoholic beverages -Take any medication unless instructed by your physician -Make any legal decisions or sign important papers.  Meals: Start with liquid foods such as gelatin or soup. Progress to regular foods as tolerated. Avoid greasy, spicy, heavy foods. If nausea and/or vomiting occur, drink only clear liquids until the nausea and/or vomiting subsides. Call your physician if vomiting continues.  Special Instructions/Symptoms: Your throat may feel dry or sore from the anesthesia or the breathing tube placed in your throat during surgery. If this causes discomfort, gargle with warm salt water. The discomfort should disappear within 24 hours.    Post stone removal/stent placement surgery instructions   Definitions:  Ureter: The duct that transports urine from the kidney to the bladder. Stent: A plastic hollow tube that is placed into the ureter, from the kidney to the bladder to prevent the ureter from swelling shut.  General instructions:  Despite the fact that no skin incisions were used, the area around the ureter and bladder is raw and irritated. The stent is a foreign body which will further irritate the bladder wall. This irritation is manifested by increased frequency of urination, both day and night, and by an increase in the urge to urinate. In some, the urge to urinate is present almost always. Sometimes the urge is strong enough that you may not be able to stop your self from urinating. The only real cure is to remove the stent and then give time for the bladder wall to heal which can't be done until the danger of the ureter swelling shut has passed. (This varies from 2-21 days).  You may see some blood in your urine while  the stent is in place and a few days afterward. Do not be alarmed, even if the urine is clear for a while. Get off your feet and drink lots of fluids until clearing occurs. If you start to pass clots or don't improve, call us.  If you have a string coming from your urethra:  The stent string is attached to your ureteral stent.  Do not pull on thisIf you have a string coming from your urethra:  The stent string is attached to your ureteral stent.  Do not pull on this.  Diet:  You may return to your normal diet immediately. Because of the raw surface of your bladder, alcohol, spicy foods, foods high in acid and drinks with caffeine may cause irritation or frequency and should be used in moderation. To keep your urine flowing freely and avoid constipation, drink plenty of fluids during the day (8-10 glasses). Tip: Avoid cranberry juice because it is very acidic.  Activity:  Your physical activity doesn't need to be restricted. However, if you are very active, you may see some blood in the urine. We suggest that you reduce your activity under the circumstances until the bleeding has stopped.  Bowels:  It is important to keep your bowels regular during the postoperative period. Straining with bowel movements can cause bleeding. A bowel movement every other day is reasonable. Use a mild laxative if needed, such as milk of magnesia 2-3 tablespoons, or 2 Dulcolax tablets. Call if you continue to have problems. If you had been taking narcotics for pain, before, during or after your  surgery, you may be constipated. Take a laxative if necessary.     Medication:  You should resume your pre-surgery medications unless told not to. DO NOT RESUME YOUR ASPIRIN, or any other medicines like ibuprofen, motrin, excedrin, advil, aleve, vitamin E, fish oil as these can all cause bleeding x 7 days. In addition you may be given an antibiotic to prevent or treat infection. Antibiotics are not always necessary. All  medication should be taken as prescribed until the bottles are finished unless you are having an unusual reaction to one of the drugs. You may restart your Coumadin in 24 hours as long as the urine is not excessively bloody.  Problems you should report to [place]:  a. Fever greater than 101F. b. Heavy bleeding, or clots (see notes above about blood in urine). c. Inability to urinate. d. Drug reactions (hives, rash, nausea, vomiting, diarrhea). e. Severe burning or pain with urination that is not improving.  Followup:  You will need a followup appointment to monitor your progress in most cases. Please call the office for this appointment when you get home if your appointment has not already been scheduled. Usually the first appointment will be about 5-14 days after your surgery and if you have a stent in place it will likely be removed at that time.

## 2017-03-07 NOTE — Op Note (Signed)
PATIENT:  Kayla Harrison  PRE-OPERATIVE DIAGNOSIS:  right Ureteral calculus  POST-OPERATIVE DIAGNOSIS: Same  PROCEDURE:  1. Cystoscopy with right retrograde pyelogram including interpretation 2. Right ureteroscopy, laser lithotripsy, stone extraction and stent placement 3. Fluoroscopy time 1 hour  SURGEON: Claybon Jabs, MD  INDICATION: Kayla Harrison is a 71 year old female who was found to have a right proximal ureteral stone and has a known 2 cm stone in her right renal pelvis that has not caused obstruction or symptoms. She developed gross hematuria and was found to have the ureteral stone. We discussed treatment of this stone.  ANESTHESIA:  General  EBL:  Minimal  DRAINS: 6 French, 24 cm contour stent in the right ureter (with string)  SPECIMEN:  Stone given to patient  DESCRIPTION OF PROCEDURE: The patient was taken to the major OR and placed on the table. General anesthesia was administered and then the patient was moved to the dorsal lithotomy position. The genitalia was sterilely prepped and draped. An official timeout was performed.  Initially the 92 French cystoscope with 30 lens was passed under direct vision into the bladder. The bladder was then fully inspected. It was noted be free of any tumors, stones or inflammatory lesions. Ureteral orifices were of normal configuration and position. A 6 French open-ended ureteral catheter was then passed through the cystoscope into the ureteral orifice in order to perform a right retrograde pyelogram.  A retrograde pyelogram was performed by injecting full-strength contrast up the right ureter under direct fluoroscopic control. It revealed a filling defect in the mid to proximal ureter consistent with the stone seen on the preoperative KUB. The remainder of the ureter was noted to be normal as was the intrarenal collecting system. Specifically the stone in her renal pelvis was noted to be nonobstructing. I then passed a 0.038  inch floppy-tipped guidewire through the open ended catheter and into the area of the renal pelvis and this was left in place. The inner portion of a ureteral access sheath was then passed over the guidewire to gently dilate the intramural ureter after which I passed the access sheath with obturator and removed the obturator and guidewire leaving the access sheath in place. I then proceeded with ureteroscopy.  A 6 [place] digital, flexible ureteroscope was then passed through the access sheath and into the ureter where the stone was located. The stone was identified and I felt it was too large to extract and therefore elected to proceed with laser lithotripsy. The 200  holmium laser fiber was used to fragment the stone. I then used the nitinol basket to extract all of the stone fragments and reinspection of the ureter ureteroscopically revealed no further stone fragments and no injury to the ureter. I then backloaded the cystoscope over the guidewire and passed the stent over the guidewire into the area of the renal pelvis. As the guidewire was removed good curl was noted in the renal pelvis. The bladder was drained and the cystoscope was then removed. The string was placed in the vagina.The patient tolerated the procedure well no intraoperative complications.  PLAN OF CARE: Discharge to home after PACU  PATIENT DISPOSITION:  PACU - hemodynamically stable.

## 2017-03-07 NOTE — Anesthesia Procedure Notes (Signed)
Procedure Name: Intubation Date/Time: 03/07/2017 9:46 AM Performed by: Justice Rocher Pre-anesthesia Checklist: Patient identified, Emergency Drugs available, Suction available and Patient being monitored Patient Re-evaluated:Patient Re-evaluated prior to inductionOxygen Delivery Method: Circle system utilized Preoxygenation: Pre-oxygenation with 100% oxygen Intubation Type: IV induction Ventilation: Mask ventilation without difficulty Laryngoscope Size: Mac, 3 and Glidescope Grade View: Grade II Tube type: Oral Number of attempts: 1 Airway Equipment and Method: Stylet and Oral airway Placement Confirmation: ETT inserted through vocal cords under direct vision,  positive ETCO2 and breath sounds checked- equal and bilateral Secured at: 19 cm Tube secured with: Tape Dental Injury: Teeth and Oropharynx as per pre-operative assessment

## 2017-03-07 NOTE — Transfer of Care (Signed)
Immediate Anesthesia Transfer of Care Note  Patient: Kayla Harrison  Procedure(s) Performed: Procedure(s) (LRB): CYSTOSCOPY WITH RIGHT RETROGRADE PYELOGRAM,RIGHT  URETEROSCOPYLASER LITHOTRIPSY  AND STENT PLACEMENT AND STONE BASKETRY (Right) HOLMIUM LASER APPLICATION (Right)  Patient Location: PACU  Anesthesia Type: General  Level of Consciousness: awake, sedated, patient cooperative and responds to stimulation  Airway & Oxygen Therapy: Patient Spontanous Breathing and Patient connected to NCO2  Post-op Assessment: Report given to PACU RN, Post -op Vital signs reviewed and stable and Patient moving all extremities  Post vital signs: Reviewed and stable  Complications: No apparent anesthesia complications

## 2017-03-07 NOTE — Anesthesia Postprocedure Evaluation (Signed)
Anesthesia Post Note  Patient: Kayla Harrison  Procedure(s) Performed: Procedure(s) (LRB): CYSTOSCOPY WITH RIGHT RETROGRADE PYELOGRAM,RIGHT  URETEROSCOPYLASER LITHOTRIPSY  AND STENT PLACEMENT AND STONE BASKETRY (Right) HOLMIUM LASER APPLICATION (Right)     Patient location during evaluation: PACU Anesthesia Type: General Level of consciousness: awake and alert Pain management: pain level controlled Vital Signs Assessment: post-procedure vital signs reviewed and stable Respiratory status: spontaneous breathing, nonlabored ventilation, respiratory function stable and patient connected to nasal cannula oxygen Cardiovascular status: blood pressure returned to baseline and stable Postop Assessment: no signs of nausea or vomiting Anesthetic complications: no    Last Vitals:  Vitals:   03/07/17 1130 03/07/17 1214  BP: 140/87 138/87  Pulse: 70 76  Resp: 12 16  Temp:  36.4 C    Last Pain:  Vitals:   03/07/17 0851  TempSrc:   PainSc: 3                  Media Pizzini,JAMES TERRILL

## 2017-03-07 NOTE — Anesthesia Preprocedure Evaluation (Addendum)
Anesthesia Evaluation  Patient identified by MRN, date of birth, ID band Patient awake    Airway Mallampati: II  TM Distance: <3 FB Neck ROM: Full  Mouth opening: Limited Mouth Opening  Dental no notable dental hx.    Pulmonary shortness of breath, sleep apnea ,  Non hodgkin lymphoma c mediastiam mass around trachea   breath sounds clear to auscultation       Cardiovascular + Peripheral Vascular Disease and +CHF  + dysrhythmias + pacemaker  Rhythm:Regular Rate:Normal  Recent ECHO noted   Neuro/Psych    GI/Hepatic (+) Hepatitis -  Endo/Other  Morbid obesity  Renal/GU Renal disease     Musculoskeletal   Abdominal   Peds  Hematology   Anesthesia Other Findings   Reproductive/Obstetrics                            Anesthesia Physical Anesthesia Plan  ASA: III  Anesthesia Plan: General   Post-op Pain Management:    Induction: Intravenous  PONV Risk Score and Plan: 4 or greater and Ondansetron, Dexamethasone, Propofol and Midazolam  Airway Management Planned:   Additional Equipment:   Intra-op Plan:   Post-operative Plan: Extubation in OR  Informed Consent: I have reviewed the patients History and Physical, chart, labs and discussed the procedure including the risks, benefits and alternatives for the proposed anesthesia with the patient or authorized representative who has indicated his/her understanding and acceptance.   Dental advisory given  Plan Discussed with:   Anesthesia Plan Comments:         Anesthesia Quick Evaluation

## 2017-03-10 ENCOUNTER — Encounter (HOSPITAL_BASED_OUTPATIENT_CLINIC_OR_DEPARTMENT_OTHER): Payer: Self-pay | Admitting: Urology

## 2017-03-11 ENCOUNTER — Ambulatory Visit (HOSPITAL_COMMUNITY)
Admission: RE | Admit: 2017-03-11 | Discharge: 2017-03-11 | Disposition: A | Discharge status: Home / Self Care | Payer: Medicare Other | Source: Ambulatory Visit | Attending: Interventional Radiology | Admitting: Interventional Radiology

## 2017-03-11 DIAGNOSIS — T148XXA Other injury of unspecified body region, initial encounter: Secondary | ICD-10-CM

## 2017-03-11 HISTORY — PX: IR PATIENT EVAL TECH 0-60 MINS: IMG5564

## 2017-03-11 NOTE — Progress Notes (Addendum)
Patient ID: Kayla Harrison, female   DOB: 07-12-1946, 71 y.o.   MRN: [id number] Patient's right chest wall Port-A-Cath site evaluated again today. There has been no worsening of pain at the site. Patient is afebrile. Ecchymosis still present but not worse; there is some edema at the port pocket with mild tenderness to palpation. No drainage or skin breakdown at site. Dr. Vernard Gambles also evaluated patient. This most likely represents small deep hematoma of the port pocket; recommend conservative care at this time; site should be okay to access for patients next oncology appointment on 6/27. She remains on Coumadin/Lovenox.

## 2017-03-12 ENCOUNTER — Encounter (HOSPITAL_COMMUNITY): Payer: Self-pay | Admitting: Radiology

## 2017-03-12 ENCOUNTER — Other Ambulatory Visit (HOSPITAL_COMMUNITY): Payer: Self-pay | Admitting: Interventional Radiology

## 2017-03-12 DIAGNOSIS — T148XXA Other injury of unspecified body region, initial encounter: Secondary | ICD-10-CM

## 2017-03-13 ENCOUNTER — Ambulatory Visit (INDEPENDENT_AMBULATORY_CARE_PROVIDER_SITE_OTHER): Payer: Medicare Other

## 2017-03-13 DIAGNOSIS — I6350 Cerebral infarction due to unspecified occlusion or stenosis of unspecified cerebral artery: Secondary | ICD-10-CM | POA: Diagnosis not present

## 2017-03-13 DIAGNOSIS — I4891 Unspecified atrial fibrillation: Secondary | ICD-10-CM

## 2017-03-13 DIAGNOSIS — Z7901 Long term (current) use of anticoagulants: Secondary | ICD-10-CM

## 2017-03-13 DIAGNOSIS — Z5181 Encounter for therapeutic drug level monitoring: Secondary | ICD-10-CM | POA: Diagnosis not present

## 2017-03-13 LAB — POCT INR: INR: 1.3

## 2017-03-14 DIAGNOSIS — N201 Calculus of ureter: Secondary | ICD-10-CM | POA: Diagnosis not present

## 2017-03-14 DIAGNOSIS — N132 Hydronephrosis with renal and ureteral calculous obstruction: Secondary | ICD-10-CM | POA: Diagnosis not present

## 2017-03-16 ENCOUNTER — Emergency Department (HOSPITAL_COMMUNITY)
Admission: EM | Admit: 2017-03-16 | Discharge: 2017-03-16 | Disposition: A | Discharge status: Home / Self Care | Payer: Medicare Other | Attending: Emergency Medicine | Admitting: Emergency Medicine

## 2017-03-16 ENCOUNTER — Encounter (HOSPITAL_COMMUNITY): Payer: Self-pay | Admitting: Emergency Medicine

## 2017-03-16 DIAGNOSIS — Z7901 Long term (current) use of anticoagulants: Secondary | ICD-10-CM | POA: Diagnosis not present

## 2017-03-16 DIAGNOSIS — Z950 Presence of cardiac pacemaker: Secondary | ICD-10-CM | POA: Insufficient documentation

## 2017-03-16 DIAGNOSIS — Z85850 Personal history of malignant neoplasm of thyroid: Secondary | ICD-10-CM | POA: Insufficient documentation

## 2017-03-16 DIAGNOSIS — E039 Hypothyroidism, unspecified: Secondary | ICD-10-CM | POA: Diagnosis not present

## 2017-03-16 DIAGNOSIS — Z85118 Personal history of other malignant neoplasm of bronchus and lung: Secondary | ICD-10-CM | POA: Diagnosis not present

## 2017-03-16 DIAGNOSIS — Z96651 Presence of right artificial knee joint: Secondary | ICD-10-CM | POA: Insufficient documentation

## 2017-03-16 DIAGNOSIS — B029 Zoster without complications: Secondary | ICD-10-CM

## 2017-03-16 DIAGNOSIS — Z853 Personal history of malignant neoplasm of breast: Secondary | ICD-10-CM | POA: Diagnosis not present

## 2017-03-16 DIAGNOSIS — Z8673 Personal history of transient ischemic attack (TIA), and cerebral infarction without residual deficits: Secondary | ICD-10-CM | POA: Diagnosis not present

## 2017-03-16 MED ORDER — HYDROCODONE-ACETAMINOPHEN 5-325 MG PO TABS: 1.0000 | ORAL_TABLET | Freq: Four times a day (QID) | ORAL | 0 refills | Status: DC | PRN

## 2017-03-16 MED ORDER — VALACYCLOVIR HCL 1 G PO TABS: 1000.0000 mg | ORAL_TABLET | Freq: Two times a day (BID) | ORAL | 0 refills | Status: DC

## 2017-03-16 MED ORDER — VALACYCLOVIR HCL 500 MG PO TABS
1000.0000 mg | ORAL_TABLET | Freq: Once | ORAL | Status: AC
  Administered 2017-03-16: 1000 mg via ORAL
  Filled 2017-03-16: qty 2

## 2017-03-16 MED ORDER — ACETAMINOPHEN ER 650 MG PO TBCR: 650.0000 mg | EXTENDED_RELEASE_TABLET | Freq: Three times a day (TID) | ORAL | 0 refills | Status: DC | PRN

## 2017-03-16 MED ORDER — LIDOCAINE 5 % EX OINT
TOPICAL_OINTMENT | Freq: Once | CUTANEOUS | Status: AC
  Administered 2017-03-16: 03:00:00 via TOPICAL
  Filled 2017-03-16: qty 35.44

## 2017-03-16 NOTE — ED Notes (Signed)
Bed: WA05 Expected date:  Expected time:  Means of arrival:  Comments: 

## 2017-03-16 NOTE — ED Provider Notes (Signed)
[facility] Provider Note   CSN: [id number] Arrival date & time: 03/16/17  0000  By signing my name below, I, Phillips Climes, attest that this documentation has been prepared under the direction and in the presence of Derwood Kaplan, MD . Electronically Signed: Phillips Climes, Scribe. 03/16/2017. 2:23 AM.   History   Chief Complaint Chief Complaint  Patient presents with  . Herpes Zoster  . Back Pain   HPI Comments Kayla Harrison is a 71 y.o. female with a PMHx significant for breast and thyroid cancers, lymphoma,  prior CVA, paroxysmal atrial fibrillation (CHADS-Vasc score of at least 4, anticoagulated with warfarin) S/P PPM implant, and who recently finished radiation therapy, who presents to the Emergency Department with complaints of a gradually worsening painful rash to her left upper back.  Pt describes this as a deep pain, which is worsened by applying touch or pressure.  She states that it feels identical to a previous episode of shingles.  Currently, her pain is rated a 6/10 in severity.  Pt denies experiencing any other acute sx, including nausea, vomiting, fevers or chills.  The history is provided by the patient and medical records. No language interpreter was used.   Past Medical History:  Diagnosis Date  . Abnormally small mouth    per pt "I have a small mouth"  . Arthritis    osteoarthritis - knees   . Atrial fibrillation [facility])    ablation- 2x's-- 1st time- [facility], 2nd event at [facility] in 2008. Convergent ablation at [facility] 6/14  . Breast cancer (HCC)    Dr Jamey Ripa, total thyroidectomy- 1999- for cancer  . Colon polyp    Dr Kinnie Scales  . Complication of anesthesia    Ketamine produces LSD reaction, bright colored nightmarish experience   . Dyslipidemia   . Dysrhythmia    a fib with internal/external ablation, currently NSR  . H/O pleural effusion    s/p thoracentesis w withdrawn  . Hepatitis    [place]  [place]hepatitis   . History of dysphagia    due to radiation therapy  . History of kidney stones   . Hx of thyroid cancer    Dr Evlyn Kanner  . Hyperlipidemia   . Hypothyroidism   . Lung cancer, lower lobe (HCC) 01/2017   radiation RX completed 03/04/17; will start chemo 6/27  . Morbid obesity (HCC)    Status post lap band surgery  . Normal cardiac stress test    2008- cardiac stress/echo  . Pacemaker-[employer]tr[employer]   . Pneumonia 2010   [facility]- during that event had to be cardioverted   . Renal calculi   . Right shoulder pain    needs shoulder replacement  . Sinus node dysfunction (HCC)    Complicating convergent ablation 6/14  . Stroke [facility])    2003- [place] x2  . Wears glasses    readers    Patient Active Problem List   Diagnosis Date Noted  . Non-Hodgkin lymphoma, unspecified, intrathoracic lymph nodes (HCC) 02/13/2017  . Mediastinal mass 02/06/2017  . 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 Abnormal chest x-ray   . Lung mass 02/03/2017  . 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  . OSA (obstructive sleep apnea) 02/03/2015  . History of renal calculi 11/16/2014  . Renal calculi 11/16/2014  . Family history of colon cancer 10/26/2014  . Encounter for therapeutic drug monitoring 11/24/2013  . Mechanical complication due to cardiac pacemaker pulse generator  11/06/2013  . Dyspnea 05/12/2013  . Hypothyroidism 04/21/2013  . Pleural effusion 04/19/2013  . Long term (current) use of anticoagulants 12/20/2010  . EPIDERMOID CYST 08/22/2010  . CHRONIC DIASTOLIC HEART FAILURE 08/06/2010  . SYNCOPE AND COLLAPSE 07/27/2010  . OSTEOARTHRITIS, KNEE, RIGHT 03/15/2010  . Morbid obesity (HCC) 12/07/2009  . NONSPEC ELEVATION OF LEVELS OF TRANSAMINASE/LDH 09/08/2009  . CVA 08/14/2009  . BREAST CANCER, HX OF 07/25/2009  . COLONIC POLYPS, HX OF 07/25/2009  . TUBULOVILLOUS ADENOMA, COLON 04/29/2008  . HYPERGLYCEMIA, FASTING 04/29/2008  . HYPERLIPIDEMIA 06/22/2007  . Atrial fibrillation (HCC)  06/22/2007  . CARCINOMA, THYROID GLAND, HX OF 06/22/2007    Past Surgical History:  Procedure Laterality Date  . ABDOMINAL HYSTERECTOMY  1983  . afib ablation     X 2; [facility] & Dr Graciela Husbands  . APPENDECTOMY    . BONE MARROW BIOPSY  02/21/2017  . BREAST LUMPECTOMY  2010  . bso  1998  . CARDIOVERSION  10/09/2012   Procedure: CARDIOVERSION;  Surgeon: Rollene Rotunda, MD;  Location: [facility] OR;  Service: Cardiovascular;  Laterality: N/A;  . CARDIOVERSION  10/09/2012   Procedure: CARDIOVERSION;  Surgeon: Rollene Rotunda, MD;  Location: [facility] ENDOSCOPY;  Service: Cardiovascular;  Laterality: N/A;  Kathlene November gave the ok to add pt to the add on , but we must check to find out if the can add pt on at [phone] ( [phone])  . CARDIOVERSION N/A 11/20/2012   Procedure: CARDIOVERSION;  Surgeon: Pricilla Riffle, MD;  Location: [facility] ENDOSCOPY;  Service: Cardiovascular;  Laterality: N/A;  . CHOLECYSTECTOMY    . COLONOSCOPY W/ POLYPECTOMY     Dr Kinnie Scales  . convergent    . CYSTOSCOPY N/A 02/06/2015   Procedure: CYSTOSCOPY;  Surgeon: Ihor Gully, MD;  Location: [facility] ORS;  Service: Urology;  Laterality: N/A;  . CYSTOSCOPY WITH RETROGRADE PYELOGRAM, URETEROSCOPY AND STENT PLACEMENT Right 02/06/2015   Procedure: RETROGRADE PYELOGRAM, RIGHT URETEROSCOPY STENT PLACEMENT;  Surgeon: Ihor Gully, MD;  Location: [facility] ORS;  Service: Urology;  Laterality: Right;  . CYSTOSCOPY WITH RETROGRADE PYELOGRAM, URETEROSCOPY AND STENT PLACEMENT Right 03/07/2017   Procedure: CYSTOSCOPY WITH RIGHT RETROGRADE PYELOGRAM,RIGHT  URETEROSCOPYLASER LITHOTRIPSY  AND STENT PLACEMENT AND STONE BASKETRY;  Surgeon: Ihor Gully, MD;  Location: [facility];  Service: Urology;  Laterality: Right;  . EYE SURGERY     cataract surgery  . HOLMIUM LASER APPLICATION N/A 02/06/2015   Procedure: HOLMIUM LASER APPLICATION;  Surgeon: Ihor Gully, MD;  Location: [facility] ORS;  Service: Urology;  Laterality: N/A;  . HOLMIUM LASER APPLICATION Right 03/07/2017   Procedure: HOLMIUM  LASER APPLICATION;  Surgeon: Ihor Gully, MD;  Location: [facility];  Service: Urology;  Laterality: Right;  . IR FLUORO GUIDE PORT INSERTION RIGHT  02/24/2017  . IR PATIENT EVAL TECH 0-60 MINS  03/11/2017  . IR US GUIDE VASC ACCESS RIGHT  02/24/2017  . JOINT REPLACEMENT  04/13/12   Right Knee replacement  . KNEE ARTHROSCOPY     bilateral  . LAPAROSCOPIC GASTRIC BANDING  07/10/2010  . LAPAROTOMY     for ruptured ovary and ovarian artery   . PACEMAKER INSERTION  03/10/13   [facility]  . POCKET REVISION N/A 12/08/2013   Procedure: POCKET REVISION;  Surgeon: Duke Salvia, MD;  Location[facility] CATH LAB;  Service: Cardiovascular;  Laterality: N/A;  . THYROIDECTOMY  1998   Dr Jamey Ripa  . TONSILLECTOMY    . TOTAL KNEE ARTHROPLASTY  04/13/2012   Procedure: TOTAL KNEE ARTHROPLASTY;  Surgeon:  Rudean Haskell, MD;  Location: [place];  Service: Orthopedics;  Laterality: Right;  . Transabdominal ablation  03/02/13   [facility]  . VIDEO BRONCHOSCOPY WITH ENDOBRONCHIAL ULTRASOUND N/A 02/07/2017   Procedure: VIDEO BRONCHOSCOPY WITH ENDOBRONCHIAL ULTRASOUND;  Surgeon: Marshell Garfinkel, MD;  Location: [place];  Service: Pulmonary;  Laterality: N/A;    OB History    No data available       Home Medications    Prior to Admission medications   Medication Sig Start Date End Date Taking? Authorizing Provider  acetaminophen (TYLENOL 8 HOUR) 650 MG CR tablet Take 1 tablet (650 mg total) by mouth every 8 (eight) hours as needed for pain. 03/16/17   Varney Biles, MD  allopurinol (ZYLOPRIM) 300 MG tablet Take 1 tablet (300 mg total) by mouth daily. 02/25/17   Nicholas Lose, MD  amiodarone (PACERONE) 200 MG tablet Take 1 tablet (200 mg total) by mouth daily. 03/06/17   Sherran Needs, NP  cycloSPORINE (RESTASIS) 0.05 % ophthalmic emulsion Place 1 drop into both eyes 2 (two) times daily.    [provider]  diphenhydramine-acetaminophen (TYLENOL PM) 25-500 MG TABS tablet Take 1 tablet by mouth at bedtime  as needed.    [provider]  enoxaparin (LOVENOX) 150 MG/ML injection Inject 1 mL (150 mg total) into the skin daily. 02/27/17   Deboraha Sprang, MD  furosemide (LASIX) 40 MG tablet Take 40 mg by mouth daily as needed for fluid.     [provider]  HYDROcodone-acetaminophen (NORCO/VICODIN) 5-325 MG tablet Take 1 tablet by mouth every 6 (six) hours as needed. 03/16/17   Varney Biles, MD  levothyroxine (SYNTHROID, LEVOTHROID) 175 MCG tablet Take 175 mcg by mouth daily before breakfast.    [provider]  lidocaine-prilocaine (EMLA) cream Apply to affected area once 02/25/17   Nicholas Lose, MD  Lifitegrast Shirley Friar) 5 % SOLN Apply 1 drop to eye 2 (two) times daily.    [provider]  LORazepam (ATIVAN) 0.5 MG tablet Take 1 tablet (0.5 mg total) by mouth at bedtime. 02/25/17   Nicholas Lose, MD  Multiple Vitamin (MULTIVITAMIN) tablet Take 1 tablet by mouth daily.     [provider]  ondansetron (ZOFRAN) 8 MG tablet Take 1 tablet (8 mg total) by mouth 2 (two) times daily as needed for refractory nausea / vomiting. Start on day 3 after cyclophosphamide chemotherapy. 02/25/17   Nicholas Lose, MD  Oxycodone HCl 10 MG TABS Take 1 tablet (10 mg total) by mouth every 4 (four) hours as needed. 03/07/17   Kathie Rhodes, MD  phenazopyridine (PYRIDIUM) 200 MG tablet Take 1 tablet (200 mg total) by mouth 3 (three) times daily as needed for pain. 03/07/17   Kathie Rhodes, MD  predniSONE (DELTASONE) 20 MG tablet Take 3 tablets (60 mg total) by mouth daily. Take on days 1-5 of chemotherapy. 02/25/17   Nicholas Lose, MD  prochlorperazine (COMPAZINE) 10 MG tablet Take 1 tablet (10 mg total) by mouth every 6 (six) hours as needed (Nausea or vomiting). 02/25/17   Nicholas Lose, MD  sucralfate (CARAFATE) 1 g tablet Take 1 tablet (1 g total) by mouth 4 (four) times daily. 02/14/17   Kyung Rudd, MD  traMADol (ULTRAM) 50 MG tablet Take 1-2 tablets (50-100 mg total) by mouth every 6 (six)  hours as needed. 02/10/17   Hayden Pedro, PA-C  valACYclovir (VALTREX) 1000 MG tablet Take 1 tablet (1,000 mg total) by mouth 2 (two) times daily. 03/16/17  Varney Biles, MD  warfarin (COUMADIN) 5 MG tablet TAKE AS DIRECTED BY  COUMADIN CLINIC 10/07/16   Deboraha Sprang, MD    Family History Family History  Problem Relation Age of Onset  . Cancer Father        COLON  . Heart disease Father 72       MI '@autopsy'$   . Diabetes Sister   . Diabetes Brother   . Diabetes Paternal Aunt   . Diabetes Paternal Grandmother     Social History Social History  Substance Use Topics  . Smoking status: Never Smoker  . Smokeless tobacco: Never Used  . Alcohol use No     Allergies   Tikosyn [dofetilide]; Xarelto [rivaroxaban]; Epinephrine; Ketamine; Eliquis [apixaban]; and Adhesive [tape]   Review of Systems Review of Systems  Constitutional: Negative for chills and fever.  Respiratory: Negative for shortness of breath.   Cardiovascular: Negative for chest pain.  Gastrointestinal: Negative for nausea and vomiting.  Musculoskeletal: Negative for arthralgias, myalgias and neck pain.  Skin: Positive for rash.  Neurological: Negative for weakness and numbness.  All other systems reviewed and are negative.  Physical Exam Updated Vital Signs BP 110/82 (BP Location: Right Wrist)   Pulse 81   Temp 97.8 F (36.6 C) (Oral)   Resp 18   Ht '5\' 6"'$  (1.676 m)   Wt 103.9 kg (229 lb)   SpO2 100%   BMI 36.96 kg/m   Physical Exam  Constitutional: She is oriented to person, place, and time. She appears well-developed and well-nourished. No distress.  HENT:  Head: Normocephalic and atraumatic.  Eyes: EOM are normal. Pupils are equal, round, and reactive to light.  Neck: Normal range of motion.  Cardiovascular: Normal rate, regular rhythm and normal heart sounds.   Pulmonary/Chest: Effort normal and breath sounds normal.  Abdominal: Soft. There is no tenderness.  Musculoskeletal: Normal  range of motion.  Neurological: She is alert and oriented to person, place, and time.  Skin: Skin is warm and dry.  12cm x 10cm area of erythema with some raised macules within it. No pustules.  No vesicles. The lesion starts at the level of T3-T8.  Tender to palpation.  Psychiatric: She has a normal mood and affect.  Nursing note and vitals reviewed.  ED Treatments / Results  DIAGNOSTIC STUDIES: Oxygen Saturation is 98% on room air, normal by my interpretation.    COORDINATION OF CARE: 1:53 AM Discussed treatment plan with pt at bedside and pt agreed to plan.  Labs (all labs ordered are listed, but only abnormal results are displayed) Labs Reviewed - No data to display  EKG  EKG Interpretation None       Radiology No results found.  Procedures Procedures (including critical care time)  Medications Ordered in ED Medications  valACYclovir (VALTREX) tablet 1,000 mg (1,000 mg Oral Given 03/16/17 0239)  lidocaine (XYLOCAINE) 5 % ointment ( Topical Given 03/16/17 0239)     Initial Impression / Assessment and Plan / ED Course  I have reviewed the triage vital signs and the nursing notes.  Pertinent labs & imaging results that were available during my care of the patient were reviewed by me and considered in my medical decision making (see chart for details).     I personally performed the services described in this documentation, which was scribed in my presence. The recorded information has been reviewed and is accurate.  Pt comes in with cc of rash to the back with deep, burning type pan -  worse when she lays down. She has hx of cancer, not on chemo but has had radiation. Based on our exam, she has shingles and has presented well within 72 hours. We will start valtrex. Pain is manageable at the moment per patient.   Final Clinical Impressions(s) / ED Diagnoses   Final diagnoses:  Herpes zoster without complication   New Prescriptions Discharge Medication List as of  03/16/2017  2:04 AM    START taking these medications   Details  acetaminophen (TYLENOL 8 HOUR) 650 MG CR tablet Take 1 tablet (650 mg total) by mouth every 8 (eight) hours as needed for pain., Starting Sun 03/16/2017, Print    HYDROcodone-acetaminophen (NORCO/VICODIN) 5-325 MG tablet Take 1 tablet by mouth every 6 (six) hours as needed., Starting Sun 03/16/2017, Print    valACYclovir (VALTREX) 1000 MG tablet Take 1 tablet (1,000 mg total) by mouth 2 (two) times daily., Starting Sun 03/16/2017, Print         Varney Biles, MD 03/20/17 [phone]

## 2017-03-16 NOTE — ED Notes (Signed)
Bed: WA08 Expected date:  Expected time:  Means of arrival:  Comments: 

## 2017-03-16 NOTE — ED Triage Notes (Signed)
Patient complaining of having shingles because she had it before and thinks that is what is wrong. Patient has a rash on left upper body that is reddened but no bumps are showing at this time.

## 2017-03-16 NOTE — ED Notes (Signed)
Pt presenting with closed, non-weeping rash on the upper left part of the back. Hx of shingles and current lymphoma pt.

## 2017-03-16 NOTE — Discharge Instructions (Signed)
Please take the medicines prescribed for pain. Please see your doctor in 3 days. Return to the ER if the rash is spreading or the pain is unbearable.

## 2017-03-16 NOTE — ED Notes (Signed)
Bed: WA07 Expected date:  Expected time:  Means of arrival:  Comments: 

## 2017-03-17 ENCOUNTER — Telehealth: Payer: Self-pay | Admitting: Hematology and Oncology

## 2017-03-17 ENCOUNTER — Ambulatory Visit (INDEPENDENT_AMBULATORY_CARE_PROVIDER_SITE_OTHER): Payer: Medicare Other | Admitting: *Deleted

## 2017-03-17 ENCOUNTER — Telehealth: Payer: Self-pay

## 2017-03-17 DIAGNOSIS — I4891 Unspecified atrial fibrillation: Secondary | ICD-10-CM

## 2017-03-17 DIAGNOSIS — Z5181 Encounter for therapeutic drug level monitoring: Secondary | ICD-10-CM

## 2017-03-17 DIAGNOSIS — Z7901 Long term (current) use of anticoagulants: Secondary | ICD-10-CM | POA: Diagnosis not present

## 2017-03-17 DIAGNOSIS — I6350 Cerebral infarction due to unspecified occlusion or stenosis of unspecified cerebral artery: Secondary | ICD-10-CM | POA: Diagnosis not present

## 2017-03-17 LAB — POCT INR: INR: 2.6

## 2017-03-17 NOTE — Telephone Encounter (Signed)
sw pt to confirm 7/11 appt at 0800 per sch msg

## 2017-03-17 NOTE — Telephone Encounter (Signed)
Pt requested a call back. When call this RN gets a squeal like a fax.  She is calling b/c she has shingles. Saturday night went to ED and is taking valcyclovir 1000 bid. Received 10 days worth. Is getting brighter red, on left scapula, sister thinks it is a little bigger. Blisters not seen. Pain is 5-6/10.   R-CHOP due on Wednesday. Last time it was delayed d/t kidney stone.  Last night she had burning and drainage from her urethra. She was thinking she had a raging UTI. This morning her urethra is uncomfortable but not nearly as bad as last night. They pulled the stent on Friday.   No fever, no diarrhea  S/w Dr Lindi Adie and R-Chop to be postponed until after the 10 days. In basket sent to change 6/27 to 7/9 or later.  Pt to drink lots of water to flush urinary tract. OK to use cranberry.  Instructed pt per above.

## 2017-03-19 ENCOUNTER — Other Ambulatory Visit: Payer: Medicare Other

## 2017-03-19 ENCOUNTER — Ambulatory Visit: Payer: Medicare Other | Admitting: Hematology and Oncology

## 2017-03-19 ENCOUNTER — Ambulatory Visit: Payer: Medicare Other

## 2017-03-19 ENCOUNTER — Encounter (HOSPITAL_COMMUNITY): Payer: Self-pay

## 2017-03-19 DIAGNOSIS — N202 Calculus of kidney with calculus of ureter: Secondary | ICD-10-CM | POA: Diagnosis not present

## 2017-03-19 DIAGNOSIS — N200 Calculus of kidney: Secondary | ICD-10-CM | POA: Diagnosis not present

## 2017-03-24 ENCOUNTER — Other Ambulatory Visit: Payer: Self-pay | Admitting: *Deleted

## 2017-03-24 MED ORDER — WARFARIN SODIUM 5 MG PO TABS: ORAL_TABLET | ORAL | 1 refills | Status: DC

## 2017-03-27 ENCOUNTER — Ambulatory Visit (INDEPENDENT_AMBULATORY_CARE_PROVIDER_SITE_OTHER): Payer: Medicare Other | Admitting: *Deleted

## 2017-03-27 DIAGNOSIS — I495 Sick sinus syndrome: Secondary | ICD-10-CM

## 2017-03-27 DIAGNOSIS — I442 Atrioventricular block, complete: Secondary | ICD-10-CM | POA: Diagnosis not present

## 2017-03-28 LAB — CUP PACEART REMOTE DEVICE CHECK
Battery Remaining Longevity: 86 mo
Battery Voltage: 3.01 V
Brady Statistic AP VP Percent: 0 %
Brady Statistic AS VS Percent: 99.96 %
Implantable Lead Implant Date: 20140618
Implantable Lead Location: 753860
Implantable Pulse Generator Implant Date: 20140618
Lead Channel Impedance Value: 380 Ohm
Lead Channel Impedance Value: 494 Ohm
Lead Channel Pacing Threshold Amplitude: 0.75 V
Lead Channel Pacing Threshold Amplitude: 0.875 V
Lead Channel Pacing Threshold Pulse Width: 0.4 ms
Lead Channel Sensing Intrinsic Amplitude: 0.875 mV
Lead Channel Sensing Intrinsic Amplitude: 8.625 mV
Lead Channel Setting Pacing Amplitude: 2.5 V
MDC IDC LEAD IMPLANT DT: 20140618
MDC IDC LEAD LOCATION: 753859
MDC IDC MSMT LEADCHNL RA PACING THRESHOLD PULSEWIDTH: 0.4 ms
MDC IDC MSMT LEADCHNL RA SENSING INTR AMPL: 0.875 mV
MDC IDC MSMT LEADCHNL RV IMPEDANCE VALUE: 912 Ohm
MDC IDC MSMT LEADCHNL RV IMPEDANCE VALUE: 969 Ohm
MDC IDC MSMT LEADCHNL RV SENSING INTR AMPL: 8.625 mV
MDC IDC SESS DTM: 20180705164742
MDC IDC SET LEADCHNL RA PACING AMPLITUDE: 2 V
MDC IDC SET LEADCHNL RV PACING PULSEWIDTH: 0.4 ms
MDC IDC SET LEADCHNL RV SENSING SENSITIVITY: 0.9 mV
MDC IDC STAT BRADY AP VS PERCENT: 0.01 %
MDC IDC STAT BRADY AS VP PERCENT: 0.03 %
MDC IDC STAT BRADY RA PERCENT PACED: 0.01 %
MDC IDC STAT BRADY RV PERCENT PACED: 0.03 %

## 2017-03-28 LAB — CHROMOSOME ANALYSIS, BONE MARROW

## 2017-03-28 NOTE — Progress Notes (Signed)
Remote pacemaker transmission.   

## 2017-04-02 ENCOUNTER — Ambulatory Visit: Payer: Medicare Other

## 2017-04-02 ENCOUNTER — Encounter: Payer: Self-pay | Admitting: Hematology and Oncology

## 2017-04-02 ENCOUNTER — Ambulatory Visit (HOSPITAL_BASED_OUTPATIENT_CLINIC_OR_DEPARTMENT_OTHER): Payer: Medicare Other | Admitting: Hematology and Oncology

## 2017-04-02 ENCOUNTER — Other Ambulatory Visit (HOSPITAL_BASED_OUTPATIENT_CLINIC_OR_DEPARTMENT_OTHER): Payer: Medicare Other

## 2017-04-02 ENCOUNTER — Ambulatory Visit (HOSPITAL_BASED_OUTPATIENT_CLINIC_OR_DEPARTMENT_OTHER): Payer: Medicare Other

## 2017-04-02 VITALS — BP 111/61 | HR 69 | Temp 97.9°F | Resp 17

## 2017-04-02 DIAGNOSIS — R52 Pain, unspecified: Secondary | ICD-10-CM

## 2017-04-02 DIAGNOSIS — C8522 Mediastinal (thymic) large B-cell lymphoma, intrathoracic lymph nodes: Secondary | ICD-10-CM

## 2017-04-02 DIAGNOSIS — Z5112 Encounter for antineoplastic immunotherapy: Secondary | ICD-10-CM

## 2017-04-02 DIAGNOSIS — C383 Malignant neoplasm of mediastinum, part unspecified: Secondary | ICD-10-CM

## 2017-04-02 DIAGNOSIS — Z5189 Encounter for other specified aftercare: Secondary | ICD-10-CM | POA: Diagnosis not present

## 2017-04-02 DIAGNOSIS — Z5111 Encounter for antineoplastic chemotherapy: Secondary | ICD-10-CM | POA: Diagnosis not present

## 2017-04-02 DIAGNOSIS — Z95828 Presence of other vascular implants and grafts: Secondary | ICD-10-CM | POA: Insufficient documentation

## 2017-04-02 LAB — COMPREHENSIVE METABOLIC PANEL
ALBUMIN: 3.3 g/dL — AB (ref 3.5–5.0)
ALK PHOS: 70 U/L (ref 40–150)
ALT: 18 U/L (ref 0–55)
ANION GAP: 10 meq/L (ref 3–11)
AST: 15 U/L (ref 5–34)
BUN: 20.9 mg/dL (ref 7.0–26.0)
CALCIUM: 9.3 mg/dL (ref 8.4–10.4)
CO2: 23 mEq/L (ref 22–29)
Chloride: 108 mEq/L (ref 98–109)
Creatinine: 1 mg/dL (ref 0.6–1.1)
EGFR: 57 mL/min/{1.73_m2} — AB (ref >90)
Glucose: 116 mg/dl (ref 70–140)
POTASSIUM: 4.3 meq/L (ref 3.5–5.1)
Sodium: 141 mEq/L (ref 136–145)
Total Bilirubin: 0.42 mg/dL (ref 0.20–1.20)
Total Protein: 6.4 g/dL (ref 6.4–8.3)

## 2017-04-02 LAB — CBC WITH DIFFERENTIAL/PLATELET
BASO%: 0.7 % (ref 0.0–2.0)
BASOS ABS: 0 10*3/uL (ref 0.0–0.1)
EOS ABS: 0.2 10*3/uL (ref 0.0–0.5)
EOS%: 4.1 % (ref 0.0–7.0)
HEMATOCRIT: 37.1 % (ref 34.8–46.6)
HGB: 11.6 g/dL (ref 11.6–15.9)
LYMPH#: 0.2 10*3/uL — AB (ref 0.9–3.3)
LYMPH%: 5.2 % — ABNORMAL LOW (ref 14.0–49.7)
MCH: 28.7 pg (ref 25.1–34.0)
MCHC: 31.3 g/dL — AB (ref 31.5–36.0)
MCV: 91.8 fL (ref 79.5–101.0)
MONO#: 0.6 10*3/uL (ref 0.1–0.9)
MONO%: 13.9 % (ref 0.0–14.0)
NEUT#: 3.3 10*3/uL (ref 1.5–6.5)
NEUT%: 76.1 % (ref 38.4–76.8)
PLATELETS: 197 10*3/uL (ref 145–400)
RBC: 4.04 10*6/uL (ref 3.70–5.45)
RDW: 15.7 % — ABNORMAL HIGH (ref 11.2–14.5)
WBC: 4.4 10*3/uL (ref 3.9–10.3)

## 2017-04-02 LAB — LACTATE DEHYDROGENASE: LDH: 202 U/L (ref 125–245)

## 2017-04-02 MED ORDER — DIPHENHYDRAMINE HCL 25 MG PO CAPS
ORAL_CAPSULE | ORAL | Status: AC
  Filled 2017-04-02: qty 2

## 2017-04-02 MED ORDER — SODIUM CHLORIDE 0.9 % IV SOLN
Freq: Once | INTRAVENOUS | Status: AC
  Administered 2017-04-02: 10:00:00 via INTRAVENOUS

## 2017-04-02 MED ORDER — DEXAMETHASONE SODIUM PHOSPHATE 10 MG/ML IJ SOLN
INTRAMUSCULAR | Status: AC
  Filled 2017-04-02: qty 1

## 2017-04-02 MED ORDER — ACETAMINOPHEN 325 MG PO TABS
650.0000 mg | ORAL_TABLET | Freq: Once | ORAL | Status: AC
  Administered 2017-04-02: 650 mg via ORAL

## 2017-04-02 MED ORDER — ACETAMINOPHEN 325 MG PO TABS
ORAL_TABLET | ORAL | Status: AC
  Filled 2017-04-02: qty 2

## 2017-04-02 MED ORDER — PALONOSETRON HCL INJECTION 0.25 MG/5ML
0.2500 mg | Freq: Once | INTRAVENOUS | Status: AC
  Administered 2017-04-02: 0.25 mg via INTRAVENOUS

## 2017-04-02 MED ORDER — PALONOSETRON HCL INJECTION 0.25 MG/5ML
INTRAVENOUS | Status: AC
  Filled 2017-04-02: qty 5

## 2017-04-02 MED ORDER — FAMOTIDINE IN NACL 20-0.9 MG/50ML-% IV SOLN
20.0000 mg | Freq: Once | INTRAVENOUS | Status: AC | PRN
  Administered 2017-04-02: 20 mg via INTRAVENOUS

## 2017-04-02 MED ORDER — OXYCODONE-ACETAMINOPHEN 5-325 MG PO TABS
ORAL_TABLET | ORAL | Status: AC
  Filled 2017-04-02: qty 1

## 2017-04-02 MED ORDER — CYCLOPHOSPHAMIDE CHEMO INJECTION 1 GM
750.0000 mg/m2 | Freq: Once | INTRAMUSCULAR | Status: AC
  Administered 2017-04-02: 1660 mg via INTRAVENOUS
  Filled 2017-04-02: qty 83

## 2017-04-02 MED ORDER — OXYCODONE-ACETAMINOPHEN 5-325 MG PO TABS
1.0000 | ORAL_TABLET | Freq: Once | ORAL | Status: AC
Start: 2017-04-02 — End: 2017-04-02
  Administered 2017-04-02: 1 via ORAL

## 2017-04-02 MED ORDER — HEPARIN SOD (PORK) LOCK FLUSH 100 UNIT/ML IV SOLN
500.0000 [IU] | Freq: Once | INTRAVENOUS | Status: AC | PRN
  Administered 2017-04-02: 500 [IU]
  Filled 2017-04-02: qty 5

## 2017-04-02 MED ORDER — SODIUM CHLORIDE 0.9% FLUSH
10.0000 mL | Freq: Once | INTRAVENOUS | Status: AC
  Administered 2017-04-02: 10 mL
  Filled 2017-04-02: qty 10

## 2017-04-02 MED ORDER — OXYCODONE-ACETAMINOPHEN 5-325 MG PO TABS
1.0000 | ORAL_TABLET | Freq: Once | ORAL | Status: AC
  Administered 2017-04-02: 1 via ORAL

## 2017-04-02 MED ORDER — PEGFILGRASTIM 6 MG/0.6ML ~~LOC~~ PSKT
6.0000 mg | PREFILLED_SYRINGE | Freq: Once | SUBCUTANEOUS | Status: AC
  Administered 2017-04-02: 6 mg via SUBCUTANEOUS
  Filled 2017-04-02: qty 0.6

## 2017-04-02 MED ORDER — RITUXIMAB CHEMO INJECTION 500 MG/50ML
375.0000 mg/m2 | Freq: Once | INTRAVENOUS | Status: AC
  Administered 2017-04-02: 800 mg via INTRAVENOUS
  Filled 2017-04-02: qty 50

## 2017-04-02 MED ORDER — DIPHENHYDRAMINE HCL 25 MG PO CAPS
50.0000 mg | ORAL_CAPSULE | Freq: Once | ORAL | Status: AC
  Administered 2017-04-02: 50 mg via ORAL

## 2017-04-02 MED ORDER — DOXORUBICIN HCL CHEMO IV INJECTION 2 MG/ML
50.0000 mg/m2 | Freq: Once | INTRAVENOUS | Status: AC
  Administered 2017-04-02: 110 mg via INTRAVENOUS
  Filled 2017-04-02: qty 55

## 2017-04-02 MED ORDER — DEXAMETHASONE SODIUM PHOSPHATE 10 MG/ML IJ SOLN
10.0000 mg | Freq: Once | INTRAMUSCULAR | Status: AC
  Administered 2017-04-02: 10 mg via INTRAVENOUS

## 2017-04-02 MED ORDER — VINCRISTINE SULFATE CHEMO INJECTION 1 MG/ML
2.0000 mg | Freq: Once | INTRAVENOUS | Status: AC
  Administered 2017-04-02: 2 mg via INTRAVENOUS
  Filled 2017-04-02: qty 2

## 2017-04-02 MED ORDER — METHYLPREDNISOLONE SODIUM SUCC 125 MG IJ SOLR
125.0000 mg | Freq: Once | INTRAMUSCULAR | Status: AC | PRN
  Administered 2017-04-02: 125 mg via INTRAVENOUS

## 2017-04-02 MED ORDER — SODIUM CHLORIDE 0.9% FLUSH
10.0000 mL | INTRAVENOUS | Status: DC | PRN
  Administered 2017-04-02: 10 mL
  Filled 2017-04-02: qty 10

## 2017-04-02 NOTE — Progress Notes (Signed)
1448 pt complained of ear pain. Mediation paused, pt went to restroom, came back complained of throat feeling very tight, ear pain has moved to throat. 1449 NS wide open to gravity 1450 solumedrol 125mg  given 1452 20 mg pepcid IV to gravity VSS 98.2, 132/80, HR 71, 95%pao2 Kristin NP, at bedside to assess pt. Pt stable, VO for percocet 5/325 for pain 1509 rtiuxan restarted at rate of 62 vol 31cc 1514 pt refused percocet, states ear pain is much better, ear pressure almost gone, throat is not tight or swollen as previous, slightly difficult to swallow but " it's ok" Will continue to monitor

## 2017-04-02 NOTE — Progress Notes (Signed)
Patient Care Team: [name], MD as PCP - General (Endocrinology)  DIAGNOSIS:  Encounter Diagnosis  Name Primary?  . 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)     SUMMARY OF ONCOLOGIC HISTORY:   Non-Hodgkin lymphoma, unspecified, intrathoracic lymph nodes ([place])   02/03/2017 Imaging    Large superior mediastinal mass contiguous with right hilar region encasing the trachea and right mainstem bronchus measuring 5.4 x 7.5 x 6 cm causing SVC obstruction, additional mediastinal lymph nodes subcarinal 1.6 cm and left hilar 1 cm; multiple small pulmonary nodules 4-6 mm       02/11/2017 Initial Diagnosis    Flow cytometry of mediastinal mass revealed monoclonal population of B cells with expression of CD10 consistent with non-Hodgkin B-cell lymphoma germinal center operation; bone marrow biopsy negative for lymphoma      04/02/2017 -  Chemotherapy    R-CHOP X 6 cycles        CHIEF COMPLIANT: Cycle 1 day 1R CHOP  INTERVAL HISTORY: Kayla Harrison is a 71 year old with above-mentioned of diffuse large B-cell lymphoma involving a mediastinal mass who is here today to receive her first cycle of R CHOP chemotherapy. Bone marrow biopsy did not reveal any evidence of lymphoma. PET CT scan primarily shows the lymphoma involving the mediastinal area. The lung nodules were too small for the PET CT scan to characterize appropriately. Patient is very anxious today about starting chemotherapy.  REVIEW OF SYSTEMS:   Constitutional: Denies fevers, chills or abnormal weight loss Eyes: Denies blurriness of vision Ears, nose, mouth, throat, and face: Denies mucositis or sore throat Respiratory: Denies cough, dyspnea or wheezes Cardiovascular: Denies palpitation, chest discomfort Gastrointestinal:  Denies nausea, heartburn or change in bowel habits Skin: Denies abnormal skin rashes Lymphatics: Denies new lymphadenopathy or easy bruising Neurological:Denies  numbness, tingling or new weaknesses Behavioral/Psych: Mood is stable, no new changes  Extremities: No lower extremity edema  All other systems were reviewed with the patient and are negative.  I have reviewed the past medical history, past surgical history, social history and family history with the patient and they are unchanged from previous note.  ALLERGIES:  is allergic to tikosyn [dofetilide]; xarelto [rivaroxaban]; epinephrine; ketamine; eliquis [apixaban]; and adhesive [tape].  MEDICATIONS:  Current Outpatient Prescriptions  Medication Sig Dispense Refill  . acetaminophen (TYLENOL 8 HOUR) 650 MG CR tablet Take 1 tablet (650 mg total) by mouth every 8 (eight) hours as needed for pain. 30 tablet 0  . allopurinol (ZYLOPRIM) 300 MG tablet Take 1 tablet (300 mg total) by mouth daily. 30 tablet 3  . amiodarone (PACERONE) 200 MG tablet Take 1 tablet (200 mg total) by mouth daily. 90 tablet 3  . cycloSPORINE (RESTASIS) 0.05 % ophthalmic emulsion Place 1 drop into both eyes 2 (two) times daily.    . diphenhydramine-acetaminophen (TYLENOL PM) 25-500 MG TABS tablet Take 1 tablet by mouth at bedtime as needed.    . enoxaparin (LOVENOX) 150 MG/ML injection Inject 1 mL (150 mg total) into the skin daily. 10 Syringe 1  . furosemide (LASIX) 40 MG tablet Take 40 mg by mouth daily as needed for fluid.     Marland Kitchen levothyroxine (SYNTHROID, LEVOTHROID) 175 MCG tablet Take 175 mcg by mouth daily before breakfast.    . lidocaine-prilocaine (EMLA) cream Apply to affected area once 30 g 3  . Lifitegrast (XIIDRA) 5 % SOLN Apply 1 drop to eye 2 (two) times daily.    Marland Kitchen LORazepam (ATIVAN) 0.5 MG tablet Take  1 tablet (0.5 mg total) by mouth at bedtime. 30 tablet 0  . Multiple Vitamin (MULTIVITAMIN) tablet Take 1 tablet by mouth daily.     . ondansetron (ZOFRAN) 8 MG tablet Take 1 tablet (8 mg total) by mouth 2 (two) times daily as needed for refractory nausea / vomiting. Start on day 3 after cyclophosphamide  chemotherapy. 30 tablet 1  . Oxycodone HCl 10 MG TABS Take 1 tablet (10 mg total) by mouth every 4 (four) hours as needed. 20 tablet 0  . phenazopyridine (PYRIDIUM) 200 MG tablet Take 1 tablet (200 mg total) by mouth 3 (three) times daily as needed for pain. 30 tablet 0  . predniSONE (DELTASONE) 20 MG tablet Take 3 tablets (60 mg total) by mouth daily. Take on days 1-5 of chemotherapy. 30 tablet 3  . prochlorperazine (COMPAZINE) 10 MG tablet Take 1 tablet (10 mg total) by mouth every 6 (six) hours as needed (Nausea or vomiting). 30 tablet 6  . sucralfate (CARAFATE) 1 g tablet Take 1 tablet (1 g total) by mouth 4 (four) times daily. 120 tablet 2  . traMADol (ULTRAM) 50 MG tablet Take 1-2 tablets (50-100 mg total) by mouth every 6 (six) hours as needed. 60 tablet 0  . warfarin (COUMADIN) 5 MG tablet Take 1 tablet daily except 1/2 tablet on Tues, Thurs, Sat or as directed by [facility] 90 tablet 1   No current facility-administered medications for this visit.     PHYSICAL EXAMINATION: ECOG PERFORMANCE STATUS: 1 - Symptomatic but completely ambulatory  Vitals:   04/02/17 0815  BP: (!) 155/82  Pulse: 77  Resp: 20  Temp: 97.8 F (36.6 C)   Filed Weights   04/02/17 0815  Weight: 233 lb 4.8 oz (105.8 kg)    GENERAL:alert, no distress and comfortable SKIN: skin color, texture, turgor are normal, no rashes or significant lesions EYES: normal, Conjunctiva are pink and non-injected, sclera clear OROPHARYNX:no exudate, no erythema and lips, buccal mucosa, and tongue normal  NECK: supple, thyroid normal size, non-tender, without nodularity LYMPH:  no palpable lymphadenopathy in the cervical, axillary or inguinal LUNGS: clear to auscultation and percussion with normal breathing effort HEART: regular rate & rhythm and no murmurs and no lower extremity edema ABDOMEN:abdomen soft, non-tender and normal bowel sounds MUSCULOSKELETAL:no cyanosis of digits and no clubbing  NEURO: alert &  oriented x 3 with fluent speech, no focal motor/sensory deficits EXTREMITIES: No lower extremity edema  LABORATORY DATA:  I have reviewed the data as listed   Chemistry      Component Value Date/Time   NA 141 04/02/2017 0753   K 4.3 04/02/2017 0753   CL 106 02/08/2017 0233   CO2 23 04/02/2017 0753   BUN 20.9 04/02/2017 0753   CREATININE 1.0 04/02/2017 0753      Component Value Date/Time   CALCIUM 9.3 04/02/2017 0753   ALKPHOS 70 04/02/2017 0753   AST 15 04/02/2017 0753   ALT 18 04/02/2017 0753   BILITOT 0.42 04/02/2017 0753       Lab Results  Component Value Date   WBC 4.4 04/02/2017   HGB 11.6 04/02/2017   HCT 37.1 04/02/2017   MCV 91.8 04/02/2017   PLT 197 04/02/2017   NEUTROABS 3.3 04/02/2017    ASSESSMENT & PLAN:  Non-Hodgkin lymphoma, unspecified, intrathoracic lymph nodes ([place]) 02/03/2017: Large superior mediastinal mass contiguous with right hilar region encasing the trachea and right mainstem bronchus measuring 5.4 x 7.5 x 6 cm causing SVC obstruction, additional mediastinal lymph  nodes subcarinal 1.6 cm and left hilar 1 cm; multiple small pulmonary nodules 4-6 mm   Bronchoscopy and biopsy 02/09/2017: Monoclonal B-cell population expressing CD10 consistent with non-Hodgkin's B-cell lymphoma germinal center origin Bone marrow biopsy 02/21/2017: No evidence of lymphoma PET/CT scan 40/35/2481: Hypermetabolic right paratracheal nodal mass ----------------------------------------------------------------------- Current treatment: Cycle 1R CHOP Antiemetics were reviewed Chemotherapy consent obtained Chemotherapy education completed Echocardiogram 02/05/2017: EF 55-60%  Return to clinic in one week for toxicity check  I spent 25 minutes talking to the patient of which more than half was spent in counseling and coordination of care.  No orders of the defined types were placed in this encounter.  The patient has a good understanding of the overall plan. she  agrees with it. she will call with any problems that may develop before the next visit here.   Rulon Eisenmenger, MD 04/02/17

## 2017-04-02 NOTE — Assessment & Plan Note (Addendum)
02/03/2017: Large superior mediastinal mass contiguous with right hilar region encasing the trachea and right mainstem bronchus measuring 5.4 x 7.5 x 6 cm causing SVC obstruction, additional mediastinal lymph nodes subcarinal 1.6 cm and left hilar 1 cm; multiple small pulmonary nodules 4-6 mm   Bronchoscopy and biopsy 02/09/2017: Monoclonal B-cell population expressing CD10 consistent with non-Hodgkin's B-cell lymphoma germinal center origin Bone marrow biopsy 02/21/2017: No evidence of lymphoma PET/CT scan 91/22/5834: Hypermetabolic right paratracheal nodal mass ----------------------------------------------------------------------- Current treatment: Cycle 1R CHOP Antiemetics were reviewed Chemotherapy consent obtained Chemotherapy education completed Echocardiogram 02/05/2017: EF 55-60%  Return to clinic in one week for toxicity check

## 2017-04-02 NOTE — Patient Instructions (Signed)
[facility] Discharge Instructions for Patients Receiving Chemotherapy  Today you received the following chemotherapy agents Adriamycin, vincristine, cytoxan, rituxan  To help prevent nausea and vomiting after your treatment, we encourage you to take your nausea medication as directed  If you develop nausea and vomiting that is not controlled by your nausea medication, call the clinic.   BELOW ARE SYMPTOMS THAT SHOULD BE REPORTED IMMEDIATELY:  *FEVER GREATER THAN 100.5 F  *CHILLS WITH OR WITHOUT FEVER  NAUSEA AND VOMITING THAT IS NOT CONTROLLED WITH YOUR NAUSEA MEDICATION  *UNUSUAL SHORTNESS OF BREATH  *UNUSUAL BRUISING OR BLEEDING  TENDERNESS IN MOUTH AND THROAT WITH OR WITHOUT PRESENCE OF ULCERS  *URINARY PROBLEMS  *BOWEL PROBLEMS  UNUSUAL RASH Items with * indicate a potential emergency and should be followed up as soon as possible.  Feel free to call the clinic you have any questions or concerns. The clinic phone number is [phone].  Please show the [place] at check-in to the Emergency Department and triage nurse.

## 2017-04-03 LAB — HEPATITIS C ANTIBODY

## 2017-04-03 LAB — HEPATITIS B SURFACE ANTIGEN: HEP B S AG: NEGATIVE

## 2017-04-04 ENCOUNTER — Encounter: Payer: Self-pay | Admitting: Cardiology

## 2017-04-07 ENCOUNTER — Telehealth: Payer: Self-pay | Admitting: Hematology and Oncology

## 2017-04-07 ENCOUNTER — Other Ambulatory Visit: Payer: Self-pay

## 2017-04-07 ENCOUNTER — Ambulatory Visit (INDEPENDENT_AMBULATORY_CARE_PROVIDER_SITE_OTHER): Payer: Medicare Other | Admitting: *Deleted

## 2017-04-07 DIAGNOSIS — Z5181 Encounter for therapeutic drug level monitoring: Secondary | ICD-10-CM

## 2017-04-07 DIAGNOSIS — I6350 Cerebral infarction due to unspecified occlusion or stenosis of unspecified cerebral artery: Secondary | ICD-10-CM | POA: Diagnosis not present

## 2017-04-07 DIAGNOSIS — Z7901 Long term (current) use of anticoagulants: Secondary | ICD-10-CM

## 2017-04-07 DIAGNOSIS — I4891 Unspecified atrial fibrillation: Secondary | ICD-10-CM | POA: Diagnosis not present

## 2017-04-07 LAB — PROTIME-INR
INR: 5.9 — ABNORMAL HIGH (ref 0.8–1.2)
PROTHROMBIN TIME: 55.5 s — AB (ref 9.1–12.0)

## 2017-04-07 LAB — POCT INR: INR: >8

## 2017-04-07 MED ORDER — DIPHENOXYLATE-ATROPINE 2.5-0.025 MG PO TABS: 2.0000 | ORAL_TABLET | Freq: Four times a day (QID) | ORAL | 0 refills | Status: DC | PRN

## 2017-04-07 NOTE — Telephone Encounter (Signed)
sw pt to confirm 7/17 appt at 0945 per sch msg

## 2017-04-07 NOTE — Progress Notes (Signed)
Spoke with pt and states that she had been experiencing 12 episodes of diarrhea despite taking imodium since the weekend. Pt states that she is feeling weak. She reports abdominal discomfort and pain. Pt wants to know if there was anything else she could take for her diarrhea. Obtained order for lomotil and will have pt come in tomorrow for possible ivf if needed. Pt verbalized understanding.Will send script  to [facility] on [address].Sent msg to scheduling to move pt appt on 7/18 to 7/17 instead and to add IVF's infusion appt per Dr.Gudena.

## 2017-04-08 ENCOUNTER — Ambulatory Visit (HOSPITAL_BASED_OUTPATIENT_CLINIC_OR_DEPARTMENT_OTHER): Payer: Medicare Other | Admitting: Hematology and Oncology

## 2017-04-08 ENCOUNTER — Ambulatory Visit (HOSPITAL_BASED_OUTPATIENT_CLINIC_OR_DEPARTMENT_OTHER): Payer: Medicare Other

## 2017-04-08 ENCOUNTER — Other Ambulatory Visit: Payer: Self-pay

## 2017-04-08 ENCOUNTER — Ambulatory Visit: Payer: Medicare Other

## 2017-04-08 ENCOUNTER — Other Ambulatory Visit (HOSPITAL_BASED_OUTPATIENT_CLINIC_OR_DEPARTMENT_OTHER): Payer: Medicare Other

## 2017-04-08 ENCOUNTER — Other Ambulatory Visit (HOSPITAL_COMMUNITY)
Admission: AD | Admit: 2017-04-08 | Discharge: 2017-04-08 | Disposition: A | Discharge status: Home / Self Care | Payer: Medicare Other | Source: Ambulatory Visit | Attending: Hematology and Oncology | Admitting: Hematology and Oncology

## 2017-04-08 ENCOUNTER — Other Ambulatory Visit: Payer: Self-pay | Admitting: *Deleted

## 2017-04-08 ENCOUNTER — Encounter: Payer: Self-pay | Admitting: Hematology and Oncology

## 2017-04-08 VITALS — BP 123/72 | HR 70 | Resp 16

## 2017-04-08 DIAGNOSIS — E860 Dehydration: Secondary | ICD-10-CM

## 2017-04-08 DIAGNOSIS — C383 Malignant neoplasm of mediastinum, part unspecified: Secondary | ICD-10-CM | POA: Diagnosis not present

## 2017-04-08 DIAGNOSIS — R51 Headache: Secondary | ICD-10-CM | POA: Diagnosis not present

## 2017-04-08 DIAGNOSIS — D6959 Other secondary thrombocytopenia: Secondary | ICD-10-CM

## 2017-04-08 DIAGNOSIS — R197 Diarrhea, unspecified: Secondary | ICD-10-CM

## 2017-04-08 DIAGNOSIS — C8522 Mediastinal (thymic) large B-cell lymphoma, intrathoracic lymph nodes: Secondary | ICD-10-CM

## 2017-04-08 DIAGNOSIS — D701 Agranulocytosis secondary to cancer chemotherapy: Secondary | ICD-10-CM

## 2017-04-08 DIAGNOSIS — Z95828 Presence of other vascular implants and grafts: Secondary | ICD-10-CM

## 2017-04-08 LAB — C DIFFICILE QUICK SCREEN W PCR REFLEX
C Diff antigen: NEGATIVE
C Diff interpretation: NOT DETECTED
C Diff toxin: NEGATIVE

## 2017-04-08 LAB — COMPREHENSIVE METABOLIC PANEL
ALT: 57 U/L — ABNORMAL HIGH (ref 0–55)
AST: 26 U/L (ref 5–34)
Albumin: 2.9 g/dL — ABNORMAL LOW (ref 3.5–5.0)
Alkaline Phosphatase: 71 U/L (ref 40–150)
Anion Gap: 10 mEq/L (ref 3–11)
BUN: 23.6 mg/dL (ref 7.0–26.0)
CHLORIDE: 105 meq/L (ref 98–109)
CO2: 24 meq/L (ref 22–29)
Calcium: 8.6 mg/dL (ref 8.4–10.4)
Creatinine: 0.8 mg/dL (ref 0.6–1.1)
EGFR: 69 mL/min/{1.73_m2} — ABNORMAL LOW (ref >90)
GLUCOSE: 126 mg/dL (ref 70–140)
POTASSIUM: 3.9 meq/L (ref 3.5–5.1)
SODIUM: 140 meq/L (ref 136–145)
Total Bilirubin: 1.06 mg/dL (ref 0.20–1.20)
Total Protein: 5.6 g/dL — ABNORMAL LOW (ref 6.4–8.3)

## 2017-04-08 LAB — CBC WITH DIFFERENTIAL/PLATELET
BASO%: 1.8 % (ref 0.0–2.0)
BASOS ABS: 0 10*3/uL (ref 0.0–0.1)
EOS%: 17.9 % — AB (ref 0.0–7.0)
Eosinophils Absolute: 0.1 10*3/uL (ref 0.0–0.5)
HCT: 33.9 % — ABNORMAL LOW (ref 34.8–46.6)
HGB: 10.8 g/dL — ABNORMAL LOW (ref 11.6–15.9)
LYMPH%: 17.9 % (ref 14.0–49.7)
MCH: 29 pg (ref 25.1–34.0)
MCHC: 31.9 g/dL (ref 31.5–36.0)
MCV: 91.1 fL (ref 79.5–101.0)
MONO#: 0 10*3/uL — ABNORMAL LOW (ref 0.1–0.9)
MONO%: 5.4 % (ref 0.0–14.0)
NEUT#: 0.3 10*3/uL — CL (ref 1.5–6.5)
NEUT%: 57 % (ref 38.4–76.8)
Platelets: 100 10*3/uL — ABNORMAL LOW (ref 145–400)
RBC: 3.72 10*6/uL (ref 3.70–5.45)
RDW: 16 % — ABNORMAL HIGH (ref 11.2–14.5)
WBC: 0.6 10*3/uL — CL (ref 3.9–10.3)
lymph#: 0.1 10*3/uL — ABNORMAL LOW (ref 0.9–3.3)

## 2017-04-08 LAB — MAGNESIUM: MAGNESIUM: 1.8 mg/dL (ref 1.5–2.5)

## 2017-04-08 MED ORDER — SODIUM CHLORIDE 0.9% FLUSH
10.0000 mL | Freq: Once | INTRAVENOUS | Status: AC
  Administered 2017-04-08: 10 mL via INTRAVENOUS
  Filled 2017-04-08: qty 10

## 2017-04-08 MED ORDER — SODIUM CHLORIDE 0.9 % IV SOLN
Freq: Once | INTRAVENOUS | Status: AC
  Administered 2017-04-08: 10:00:00 via INTRAVENOUS

## 2017-04-08 MED ORDER — OXYCODONE-ACETAMINOPHEN 5-325 MG PO TABS
ORAL_TABLET | ORAL | Status: AC
  Filled 2017-04-08: qty 1

## 2017-04-08 MED ORDER — HEPARIN SOD (PORK) LOCK FLUSH 100 UNIT/ML IV SOLN
500.0000 [IU] | Freq: Once | INTRAVENOUS | Status: AC
  Administered 2017-04-08: 500 [IU] via INTRAVENOUS
  Filled 2017-04-08: qty 5

## 2017-04-08 MED ORDER — OCTREOTIDE ACETATE 30 MG IM KIT
30.0000 mg | PACK | Freq: Once | INTRAMUSCULAR | Status: AC
  Administered 2017-04-08: 30 mg via INTRAMUSCULAR
  Filled 2017-04-08: qty 1

## 2017-04-08 MED ORDER — OXYCODONE-ACETAMINOPHEN 5-325 MG PO TABS
1.0000 | ORAL_TABLET | Freq: Once | ORAL | Status: AC
  Administered 2017-04-08: 1 via ORAL

## 2017-04-08 NOTE — Patient Instructions (Signed)
Implanted Port Home Guide An implanted port is a type of central line that is placed under the skin. Central lines are used to provide IV access when treatment or nutrition needs to be given through a person's veins. Implanted ports are used for long-term IV access. An implanted port may be placed because:  You need IV medicine that would be irritating to the small veins in your hands or arms.  You need long-term IV medicines, such as antibiotics.  You need IV nutrition for a long period.  You need frequent blood draws for lab tests.  You need dialysis.  Implanted ports are usually placed in the chest area, but they can also be placed in the upper arm, the abdomen, or the leg. An implanted port has two main parts:  Reservoir. The reservoir is round and will appear as a small, raised area under your skin. The reservoir is the part where a needle is inserted to give medicines or draw blood.  Catheter. The catheter is a thin, flexible tube that extends from the reservoir. The catheter is placed into a large vein. Medicine that is inserted into the reservoir goes into the catheter and then into the vein.  How will I care for my incision site? Do not get the incision site wet. Bathe or shower as directed by your health care provider. How is my port accessed? Special steps must be taken to access the port:  Before the port is accessed, a numbing cream can be placed on the skin. This helps numb the skin over the port site.  Your health care provider uses a sterile technique to access the port. ? Your health care provider must put on a mask and sterile gloves. ? The skin over your port is cleaned carefully with an antiseptic and allowed to dry. ? The port is gently pinched between sterile gloves, and a needle is inserted into the port.  Only "non-coring" port needles should be used to access the port. Once the port is accessed, a blood return should be checked. This helps ensure that the port  is in the vein and is not clogged.  If your port needs to remain accessed for a constant infusion, a clear (transparent) bandage will be placed over the needle site. The bandage and needle will need to be changed every week, or as directed by your health care provider.  Keep the bandage covering the needle clean and dry. Do not get it wet. Follow your health care provider's instructions on how to take a shower or bath while the port is accessed.  If your port does not need to stay accessed, no bandage is needed over the port.  What is flushing? Flushing helps keep the port from getting clogged. Follow your health care provider's instructions on how and when to flush the port. Ports are usually flushed with saline solution or a medicine called heparin. The need for flushing will depend on how the port is used.  If the port is used for intermittent medicines or blood draws, the port will need to be flushed: ? After medicines have been given. ? After blood has been drawn. ? As part of routine maintenance.  If a constant infusion is running, the port may not need to be flushed.  How long will my port stay implanted? The port can stay in for as long as your health care provider thinks it is needed. When it is time for the port to come out, surgery will be   done to remove it. The procedure is similar to the one performed when the port was put in. When should I seek immediate medical care? When you have an implanted port, you should seek immediate medical care if:  You notice a bad smell coming from the incision site.  You have swelling, redness, or drainage at the incision site.  You have more swelling or pain at the port site or the surrounding area.  You have a fever that is not controlled with medicine.  This information is not intended to replace advice given to you by your health care provider. Make sure you discuss any questions you have with your health care provider. Document  Released: 09/09/2005 Document Revised: 02/15/2016 Document Reviewed: 05/17/2013 Elsevier Interactive Patient Education  2017 Elsevier Inc.  

## 2017-04-08 NOTE — Patient Instructions (Signed)
Diarrhea, Adult Diarrhea is frequent loose and watery bowel movements. Diarrhea can make you feel weak and cause you to become dehydrated. Dehydration can make you tired and thirsty, cause you to have a dry mouth, and decrease how often you urinate. Diarrhea typically lasts 2-3 days. However, it can last longer if it is a sign of something more serious. It is important to treat your diarrhea as told by your health care provider. Follow these instructions at home: Eating and drinking  Follow these recommendations as told by your health care provider:  Take an oral rehydration solution (ORS). This is a drink that is sold at pharmacies and retail stores.  Drink clear fluids, such as water, ice chips, diluted fruit juice, and low-calorie sports drinks.  Eat bland, easy-to-digest foods in small amounts as you are able. These foods include bananas, applesauce, rice, lean meats, toast, and crackers.  Avoid drinking fluids that contain a lot of sugar or caffeine, such as energy drinks, sports drinks, and soda.  Avoid alcohol.  Avoid spicy or fatty foods.  General instructions  Drink enough fluid to keep your urine clear or pale yellow.  Wash your hands often. If soap and water are not available, use hand sanitizer.  Make sure that all people in your household wash their hands well and often.  Take over-the-counter and prescription medicines only as told by your health care provider.  Rest at home while you recover.  Watch your condition for any changes.  Take a warm bath to relieve any burning or pain from frequent diarrhea episodes.  Keep all follow-up visits as told by your health care provider. This is important. Contact a health care provider if:  You have a fever.  Your diarrhea gets worse.  You have new symptoms.  You cannot keep fluids down.  You feel light-headed or dizzy.  You have a headache  You have muscle cramps. Get help right away if:  You have chest  pain.  You feel extremely weak or you faint.  You have bloody or black stools or stools that look like tar.  You have severe pain, cramping, or bloating in your abdomen.  You have trouble breathing or you are breathing very quickly.  Your heart is beating very quickly.  Your skin feels cold and clammy.  You feel confused.  You have signs of dehydration, such as: ? Dark urine, very little urine, or no urine. ? Cracked lips. ? Dry mouth. ? Sunken eyes. ? Sleepiness. ? Weakness. This information is not intended to replace advice given to you by your health care provider. Make sure you discuss any questions you have with your health care provider. Document Released: 08/30/2002 Document Revised: 01/18/2016 Document Reviewed: 05/16/2015 Elsevier Interactive Patient Education  2017 Elsevier Inc.     Dehydration, Adult Dehydration is a condition in which there is not enough fluid or water in the body. This happens when you lose more fluids than you take in. Important organs, such as the kidneys, brain, and heart, cannot function without a proper amount of fluids. Any loss of fluids from the body can lead to dehydration. Dehydration can range from mild to severe. This condition should be treated right away to prevent it from becoming severe. What are the causes? This condition may be caused by:  Vomiting.  Diarrhea.  Excessive sweating, such as from heat exposure or exercise.  Not drinking enough fluid, especially: ? When ill. ? While doing activity that requires a lot of energy.  Excessive urination.  Fever.  Infection.  Certain medicines, such as medicines that cause the body to lose excess fluid (diuretics).  Inability to access safe drinking water.  Reduced physical ability to get adequate water and food.  What increases the risk? This condition is more likely to develop in people:  Who have a poorly controlled long-term (chronic) illness, such as diabetes,  heart disease, or kidney disease.  Who are age 39 or older.  Who are disabled.  Who live in a place with high altitude.  Who play endurance sports.  What are the signs or symptoms? Symptoms of mild dehydration may include:  Thirst.  Dry lips.  Slightly dry mouth.  Dry, warm skin.  Dizziness. Symptoms of moderate dehydration may include:  Very dry mouth.  Muscle cramps.  Dark urine. Urine may be the color of tea.  Decreased urine production.  Decreased tear production.  Heartbeat that is irregular or faster than normal (palpitations).  Headache.  Light-headedness, especially when you stand up from a sitting position.  Fainting (syncope). Symptoms of severe dehydration may include:  Changes in skin, such as: ? Cold and clammy skin. ? Blotchy (mottled) or pale skin. ? Skin that does not quickly return to normal after being lightly pinched and released (poor skin turgor).  Changes in body fluids, such as: ? Extreme thirst. ? No tear production. ? Inability to sweat when body temperature is high, such as in hot weather. ? Very little urine production.  Changes in vital signs, such as: ? Weak pulse. ? Pulse that is more than 100 beats a minute when sitting still. ? Rapid breathing. ? Low blood pressure.  Other changes, such as: ? Sunken eyes. ? Cold hands and feet. ? Confusion. ? Lack of energy (lethargy). ? Difficulty waking up from sleep. ? Short-term weight loss. ? Unconsciousness. How is this diagnosed? This condition is diagnosed based on your symptoms and a physical exam. Blood and urine tests may be done to help confirm the diagnosis. How is this treated? Treatment for this condition depends on the severity. Mild or moderate dehydration can often be treated at home. Treatment should be started right away. Do not wait until dehydration becomes severe. Severe dehydration is an emergency and it needs to be treated in a hospital. Treatment for mild  dehydration may include:  Drinking more fluids.  Replacing salts and minerals in your blood (electrolytes) that you may have lost. Treatment for moderate dehydration may include:  Drinking an oral rehydration solution (ORS). This is a drink that helps you replace fluids and electrolytes (rehydrate). It can be found at pharmacies and retail stores. Treatment for severe dehydration may include:  Receiving fluids through an IV tube.  Receiving an electrolyte solution through a feeding tube that is passed through your nose and into your stomach (nasogastric tube, or NG tube).  Correcting any abnormalities in electrolytes.  Treating the underlying cause of dehydration. Follow these instructions at home:  If directed by your health care provider, drink an ORS: ? Make an ORS by following instructions on the package. ? Start by drinking small amounts, about  cup (120 mL) every 5-10 minutes. ? Slowly increase how much you drink until you have taken the amount recommended by your health care provider.  Drink enough clear fluid to keep your urine clear or pale yellow. If you were told to drink an ORS, finish the ORS first, then start slowly drinking other clear fluids. Drink fluids such as: ? Water. Do  not drink only water. Doing that can lead to having too little salt (sodium) in the body (hyponatremia). ? Ice chips. ? Fruit juice that you have added water to (diluted fruit juice). ? Low-calorie sports drinks.  Avoid: ? Alcohol. ? Drinks that contain a lot of sugar. These include high-calorie sports drinks, fruit juice that is not diluted, and soda. ? Caffeine. ? Foods that are greasy or contain a lot of fat or sugar.  Take over-the-counter and prescription medicines only as told by your health care provider.  Do not take sodium tablets. This can lead to having too much sodium in the body (hypernatremia).  Eat foods that contain a healthy balance of electrolytes, such as bananas,  oranges, potatoes, tomatoes, and spinach.  Keep all follow-up visits as told by your health care provider. This is important. Contact a health care provider if:  You have abdominal pain that: ? Gets worse. ? Stays in one area (localizes).  You have a rash.  You have a stiff neck.  You are more irritable than usual.  You are sleepier or more difficult to wake up than usual.  You feel weak or dizzy.  You feel very thirsty.  You have urinated only a small amount of very dark urine over 6-8 hours. Get help right away if:  You have symptoms of severe dehydration.  You cannot drink fluids without vomiting.  Your symptoms get worse with treatment.  You have a fever.  You have a severe headache.  You have vomiting or diarrhea that: ? Gets worse. ? Does not go away.  You have blood or green matter (bile) in your vomit.  You have blood in your stool. This may cause stool to look black and tarry.  You have not urinated in 6-8 hours.  You faint.  Your heart rate while sitting still is over 100 beats a minute.  You have trouble breathing. This information is not intended to replace advice given to you by your health care provider. Make sure you discuss any questions you have with your health care provider. Document Released: 09/09/2005 Document Revised: 04/05/2016 Document Reviewed: 11/03/2015 Elsevier Interactive Patient Education  [employer].

## 2017-04-08 NOTE — Progress Notes (Signed)
Patient Care Team: [name], MD as PCP - General (Endocrinology)  DIAGNOSIS:  Encounter Diagnosis  Name Primary?  . 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)     SUMMARY OF ONCOLOGIC HISTORY:   Non-Hodgkin lymphoma, unspecified, intrathoracic lymph nodes ([place])   02/03/2017 Imaging    Large superior mediastinal mass contiguous with right hilar region encasing the trachea and right mainstem bronchus measuring 5.4 x 7.5 x 6 cm causing SVC obstruction, additional mediastinal lymph nodes subcarinal 1.6 cm and left hilar 1 cm; multiple small pulmonary nodules 4-6 mm       02/11/2017 Initial Diagnosis    Flow cytometry of mediastinal mass revealed monoclonal population of B cells with expression of CD10 consistent with non-Hodgkin B-cell lymphoma germinal center operation; bone marrow biopsy negative for lymphoma      04/02/2017 -  Chemotherapy    R-CHOP X 6 cycles        CHIEF COMPLIANT: Cycle 1 day 7 toxicity evaluation, severe and profuse diarrhea  INTERVAL HISTORY: Kayla Harrison is a 71 year old with above-mentioned history of non-Hodgkin's lymphoma with mediastinal mass who is here for toxicity evaluation after cycle 1 treatment. We received a call yesterday evening suggesting that she has been having profound diarrhea. She was having up to 18 loose stools per day. Because of this she could not sleep and feels exhausted and is also experiencing a headache on the right side of the head. She has not been eating or drinking much and feels dehydrated. She also has a sore throat in the roof of the mouth. Denies any fevers or chills. She reports of the diarrhea is watery in nature and is foul-smelling.  REVIEW OF SYSTEMS:   Constitutional: Denies fevers, chills or abnormal weight loss, complains of profound fatigue and right-sided headaches Eyes: Denies blurriness of vision Ears, nose, mouth, throat, and face: Denies mucositis or sore  throat Respiratory: Denies cough, dyspnea or wheezes Cardiovascular: Denies palpitation, chest discomfort Gastrointestinal:  Severe diarrhea Skin: Denies abnormal skin rashes Lymphatics: Denies new lymphadenopathy or easy bruising Neurological:Denies numbness, tingling or new weaknesses Behavioral/Psych: Mood is stable, no new changes  Extremities: No lower extremity edema All other systems were reviewed with the patient and are negative.  I have reviewed the past medical history, past surgical history, social history and family history with the patient and they are unchanged from previous note.  ALLERGIES:  is allergic to tikosyn [dofetilide]; xarelto [rivaroxaban]; epinephrine; ketamine; eliquis [apixaban]; and adhesive [tape].  MEDICATIONS:  Current Outpatient Prescriptions  Medication Sig Dispense Refill  . acetaminophen (TYLENOL 8 HOUR) 650 MG CR tablet Take 1 tablet (650 mg total) by mouth every 8 (eight) hours as needed for pain. 30 tablet 0  . allopurinol (ZYLOPRIM) 300 MG tablet Take 1 tablet (300 mg total) by mouth daily. 30 tablet 3  . amiodarone (PACERONE) 200 MG tablet Take 1 tablet (200 mg total) by mouth daily. 90 tablet 3  . cycloSPORINE (RESTASIS) 0.05 % ophthalmic emulsion Place 1 drop into both eyes 2 (two) times daily.    . diphenhydramine-acetaminophen (TYLENOL PM) 25-500 MG TABS tablet Take 1 tablet by mouth at bedtime as needed.    . diphenoxylate-atropine (LOMOTIL) 2.5-0.025 MG tablet Take 2 tablets by mouth 4 (four) times daily as needed for diarrhea or loose stools. 30 tablet 0  . enoxaparin (LOVENOX) 150 MG/ML injection Inject 1 mL (150 mg total) into the skin daily. 10 Syringe 1  . furosemide (LASIX) 40 MG tablet  Take 40 mg by mouth daily as needed for fluid.     Marland Kitchen levothyroxine (SYNTHROID, LEVOTHROID) 175 MCG tablet Take 175 mcg by mouth daily before breakfast.    . lidocaine-prilocaine (EMLA) cream Apply to affected area once 30 g 3  . Lifitegrast (XIIDRA) 5  % SOLN Apply 1 drop to eye 2 (two) times daily.    Marland Kitchen LORazepam (ATIVAN) 0.5 MG tablet Take 1 tablet (0.5 mg total) by mouth at bedtime. 30 tablet 0  . Multiple Vitamin (MULTIVITAMIN) tablet Take 1 tablet by mouth daily.     . ondansetron (ZOFRAN) 8 MG tablet Take 1 tablet (8 mg total) by mouth 2 (two) times daily as needed for refractory nausea / vomiting. Start on day 3 after cyclophosphamide chemotherapy. 30 tablet 1  . Oxycodone HCl 10 MG TABS Take 1 tablet (10 mg total) by mouth every 4 (four) hours as needed. 20 tablet 0  . phenazopyridine (PYRIDIUM) 200 MG tablet Take 1 tablet (200 mg total) by mouth 3 (three) times daily as needed for pain. 30 tablet 0  . predniSONE (DELTASONE) 20 MG tablet Take 3 tablets (60 mg total) by mouth daily. Take on days 1-5 of chemotherapy. 30 tablet 3  . prochlorperazine (COMPAZINE) 10 MG tablet Take 1 tablet (10 mg total) by mouth every 6 (six) hours as needed (Nausea or vomiting). 30 tablet 6  . sucralfate (CARAFATE) 1 g tablet Take 1 tablet (1 g total) by mouth 4 (four) times daily. 120 tablet 2  . traMADol (ULTRAM) 50 MG tablet Take 1-2 tablets (50-100 mg total) by mouth every 6 (six) hours as needed. 60 tablet 0  . warfarin (COUMADIN) 5 MG tablet Take 1 tablet daily except 1/2 tablet on Tues, Thurs, Sat or as directed by [facility] 90 tablet 1   No current facility-administered medications for this visit.     PHYSICAL EXAMINATION: ECOG PERFORMANCE STATUS: 2 - Symptomatic, <50% confined to bed  There were no vitals filed for this visit. There were no vitals filed for this visit.  GENERAL:alert, no distress and comfortable SKIN: skin color, texture, turgor are normal, no rashes or significant lesions EYES: normal, Conjunctiva are pink and non-injected, sclera clear OROPHARYNX:no exudate, no erythema and lips, buccal mucosa, and tongue normal  NECK: supple, thyroid normal size, non-tender, without nodularity LYMPH:  no palpable lymphadenopathy in  the cervical, axillary or inguinal LUNGS: clear to auscultation and percussion with normal breathing effort HEART: regular rate & rhythm and no murmurs and no lower extremity edema ABDOMEN:abdomen soft, non-tender and normal bowel sounds MUSCULOSKELETAL:no cyanosis of digits and no clubbing  NEURO: alert & oriented x 3 with fluent speech, no focal motor/sensory deficits EXTREMITIES: No lower extremity edema  LABORATORY DATA:  I have reviewed the data as listed   Chemistry      Component Value Date/Time   NA 140 04/08/2017 0954   K 3.9 04/08/2017 0954   CL 106 02/08/2017 0233   CO2 24 04/08/2017 0954   BUN 23.6 04/08/2017 0954   CREATININE 0.8 04/08/2017 0954      Component Value Date/Time   CALCIUM 8.6 04/08/2017 0954   ALKPHOS 71 04/08/2017 0954   AST 26 04/08/2017 0954   ALT 57 (H) 04/08/2017 0954   BILITOT 1.06 04/08/2017 0954       Lab Results  Component Value Date   WBC 0.6 (LL) 04/08/2017   HGB 10.8 (L) 04/08/2017   HCT 33.9 (L) 04/08/2017   MCV 91.1 04/08/2017  PLT 100 (L) 04/08/2017   NEUTROABS 0.3 (LL) 04/08/2017    ASSESSMENT & PLAN:  Non-Hodgkin lymphoma, unspecified, intrathoracic lymph nodes ([place]) 02/03/2017: Large superior mediastinal mass contiguous with right hilar region encasing the trachea and right mainstem bronchus measuring 5.4 x 7.5 x 6 cm causing SVC obstruction, additional mediastinal lymph nodes subcarinal 1.6 cm and left hilar 1 cm; multiple small pulmonary nodules 4-6 mm   Bronchoscopy and biopsy 02/09/2017: Monoclonal B-cell population expressing CD10 consistent with non-Hodgkin's B-cell lymphoma germinal center origin Bone marrow biopsy 02/21/2017: No evidence of lymphoma PET/CT scan 41/32/4401: Hypermetabolic right paratracheal nodal mass ----------------------------------------------------------------------- Current treatment: Cycle 1 day 8 R-CHOP Chemotherapy toxicities: 1Severe profound diarrhea: In spite of Lomotil and  Imodium. We will send the stool for C. Difficile C. difficile is negative then we will administer Sandostatin 30 g intramuscularly. If the C. difficile is positive then we will treat her with Flagyl.  2. Severe profound fatigue and headaches   3. Dehydration: IV fluids are being given. Electrolytes are being monitored. 4. Thrombocytopenia platelets of 100 5. Severe neutropenia ANC 300. I will decrease the dosage of Clinical chemotherapy.  Patient does not have fevers and hence we decided to treat her as an outpatient.  Return to clinic for recheck on 04/11/2017 Return to clinic in 2 weeks for cycle 2   I spent 25 minutes talking to the patient of which more than half was spent in counseling and coordination of care.  No orders of the defined types were placed in this encounter.  The patient has a good understanding of the overall plan. she agrees with it. she will call with any problems that may develop before the next visit here.   Rulon Eisenmenger, MD 04/08/17  Addendum: Stool C. difficile came back negative Sandostatin 30 mg was given Patient will be seen back on 04/10/2017 for fluids and follow-up with Mendel Ryder

## 2017-04-08 NOTE — Assessment & Plan Note (Signed)
02/03/2017: Large superior mediastinal mass contiguous with right hilar region encasing the trachea and right mainstem bronchus measuring 5.4 x 7.5 x 6 cm causing SVC obstruction, additional mediastinal lymph nodes subcarinal 1.6 cm and left hilar 1 cm; multiple small pulmonary nodules 4-6 mm   Bronchoscopy and biopsy 02/09/2017: Monoclonal B-cell population expressing CD10 consistent with non-Hodgkin's B-cell lymphoma germinal center origin Bone marrow biopsy 02/21/2017: No evidence of lymphoma PET/CT scan 54/62/7035: Hypermetabolic right paratracheal nodal mass ----------------------------------------------------------------------- Current treatment: Cycle 1 day 8 R-CHOP Chemotherapy toxicities:   Return to clinic in 2 weeks for cycle 2

## 2017-04-09 ENCOUNTER — Other Ambulatory Visit: Payer: Medicare Other

## 2017-04-09 ENCOUNTER — Telehealth: Payer: Self-pay

## 2017-04-09 ENCOUNTER — Other Ambulatory Visit: Payer: Self-pay

## 2017-04-09 ENCOUNTER — Other Ambulatory Visit: Payer: Self-pay | Admitting: Hematology and Oncology

## 2017-04-09 ENCOUNTER — Ambulatory Visit: Payer: Medicare Other | Admitting: Hematology and Oncology

## 2017-04-09 ENCOUNTER — Ambulatory Visit (HOSPITAL_BASED_OUTPATIENT_CLINIC_OR_DEPARTMENT_OTHER): Payer: Medicare Other

## 2017-04-09 ENCOUNTER — Encounter: Payer: Self-pay | Admitting: *Deleted

## 2017-04-09 VITALS — BP 122/58 | HR 71 | Temp 98.0°F | Resp 18 | Ht 66.0 in

## 2017-04-09 DIAGNOSIS — C383 Malignant neoplasm of mediastinum, part unspecified: Secondary | ICD-10-CM | POA: Diagnosis not present

## 2017-04-09 DIAGNOSIS — R197 Diarrhea, unspecified: Secondary | ICD-10-CM

## 2017-04-09 MED ORDER — OXYCODONE-ACETAMINOPHEN 5-325 MG PO TABS
1.0000 | ORAL_TABLET | Freq: Once | ORAL | Status: AC
  Administered 2017-04-09: 1 via ORAL

## 2017-04-09 MED ORDER — OXYCODONE-ACETAMINOPHEN 5-325 MG PO TABS
ORAL_TABLET | ORAL | Status: AC
  Filled 2017-04-09: qty 1

## 2017-04-09 MED ORDER — OXYCODONE-ACETAMINOPHEN 5-325 MG PO TABS: 1.0000 | ORAL_TABLET | Freq: Three times a day (TID) | ORAL | 0 refills | Status: DC | PRN

## 2017-04-09 MED ORDER — DICYCLOMINE HCL 20 MG PO TABS: 20.0000 mg | ORAL_TABLET | Freq: Four times a day (QID) | ORAL | 1 refills | Status: DC

## 2017-04-09 MED ORDER — SODIUM CHLORIDE 0.9 % IV SOLN
INTRAVENOUS | Status: DC
  Administered 2017-04-09: 15:00:00 via INTRAVENOUS

## 2017-04-09 MED ORDER — DICYCLOMINE HCL 20 MG PO TABS
20.0000 mg | ORAL_TABLET | Freq: Four times a day (QID) | ORAL | 1 refills | Status: DC
Start: 2017-04-09 — End: 2017-04-09

## 2017-04-09 NOTE — Patient Instructions (Signed)
Dehydration, Adult Dehydration is a condition in which there is not enough fluid or water in the body. This happens when you lose more fluids than you take in. Important organs, such as the kidneys, brain, and heart, cannot function without a proper amount of fluids. Any loss of fluids from the body can lead to dehydration. Dehydration can range from mild to severe. This condition should be treated right away to prevent it from becoming severe. What are the causes? This condition may be caused by:  Vomiting.  Diarrhea.  Excessive sweating, such as from heat exposure or exercise.  Not drinking enough fluid, especially: ? When ill. ? While doing activity that requires a lot of energy.  Excessive urination.  Fever.  Infection.  Certain medicines, such as medicines that cause the body to lose excess fluid (diuretics).  Inability to access safe drinking water.  Reduced physical ability to get adequate water and food.  What increases the risk? This condition is more likely to develop in people:  Who have a poorly controlled long-term (chronic) illness, such as diabetes, heart disease, or kidney disease.  Who are age 65 or older.  Who are disabled.  Who live in a place with high altitude.  Who play endurance sports.  What are the signs or symptoms? Symptoms of mild dehydration may include:  Thirst.  Dry lips.  Slightly dry mouth.  Dry, warm skin.  Dizziness. Symptoms of moderate dehydration may include:  Very dry mouth.  Muscle cramps.  Dark urine. Urine may be the color of tea.  Decreased urine production.  Decreased tear production.  Heartbeat that is irregular or faster than normal (palpitations).  Headache.  Light-headedness, especially when you stand up from a sitting position.  Fainting (syncope). Symptoms of severe dehydration may include:  Changes in skin, such as: ? Cold and clammy skin. ? Blotchy (mottled) or pale skin. ? Skin that does  not quickly return to normal after being lightly pinched and released (poor skin turgor).  Changes in body fluids, such as: ? Extreme thirst. ? No tear production. ? Inability to sweat when body temperature is high, such as in hot weather. ? Very little urine production.  Changes in vital signs, such as: ? Weak pulse. ? Pulse that is more than 100 beats a minute when sitting still. ? Rapid breathing. ? Low blood pressure.  Other changes, such as: ? Sunken eyes. ? Cold hands and feet. ? Confusion. ? Lack of energy (lethargy). ? Difficulty waking up from sleep. ? Short-term weight loss. ? Unconsciousness. How is this diagnosed? This condition is diagnosed based on your symptoms and a physical exam. Blood and urine tests may be done to help confirm the diagnosis. How is this treated? Treatment for this condition depends on the severity. Mild or moderate dehydration can often be treated at home. Treatment should be started right away. Do not wait until dehydration becomes severe. Severe dehydration is an emergency and it needs to be treated in a hospital. Treatment for mild dehydration may include:  Drinking more fluids.  Replacing salts and minerals in your blood (electrolytes) that you may have lost. Treatment for moderate dehydration may include:  Drinking an oral rehydration solution (ORS). This is a drink that helps you replace fluids and electrolytes (rehydrate). It can be found at pharmacies and retail stores. Treatment for severe dehydration may include:  Receiving fluids through an IV tube.  Receiving an electrolyte solution through a feeding tube that is passed through your nose   and into your stomach (nasogastric tube, or NG tube).  Correcting any abnormalities in electrolytes.  Treating the underlying cause of dehydration. Follow these instructions at home:  If directed by your health care provider, drink an ORS: ? Make an ORS by following instructions on the  package. ? Start by drinking small amounts, about  cup (120 mL) every 5-10 minutes. ? Slowly increase how much you drink until you have taken the amount recommended by your health care provider.  Drink enough clear fluid to keep your urine clear or pale yellow. If you were told to drink an ORS, finish the ORS first, then start slowly drinking other clear fluids. Drink fluids such as: ? Water. Do not drink only water. Doing that can lead to having too little salt (sodium) in the body (hyponatremia). ? Ice chips. ? Fruit juice that you have added water to (diluted fruit juice). ? Low-calorie sports drinks.  Avoid: ? Alcohol. ? Drinks that contain a lot of sugar. These include high-calorie sports drinks, fruit juice that is not diluted, and soda. ? Caffeine. ? Foods that are greasy or contain a lot of fat or sugar.  Take over-the-counter and prescription medicines only as told by your health care provider.  Do not take sodium tablets. This can lead to having too much sodium in the body (hypernatremia).  Eat foods that contain a healthy balance of electrolytes, such as bananas, oranges, potatoes, tomatoes, and spinach.  Keep all follow-up visits as told by your health care provider. This is important. Contact a health care provider if:  You have abdominal pain that: ? Gets worse. ? Stays in one area (localizes).  You have a rash.  You have a stiff neck.  You are more irritable than usual.  You are sleepier or more difficult to wake up than usual.  You feel weak or dizzy.  You feel very thirsty.  You have urinated only a small amount of very dark urine over 6-8 hours. Get help right away if:  You have symptoms of severe dehydration.  You cannot drink fluids without vomiting.  Your symptoms get worse with treatment.  You have a fever.  You have a severe headache.  You have vomiting or diarrhea that: ? Gets worse. ? Does not go away.  You have blood or green matter  (bile) in your vomit.  You have blood in your stool. This may cause stool to look black and tarry.  You have not urinated in 6-8 hours.  You faint.  Your heart rate while sitting still is over 100 beats a minute.  You have trouble breathing. This information is not intended to replace advice given to you by your health care provider. Make sure you discuss any questions you have with your health care provider. Document Released: 09/09/2005 Document Revised: 04/05/2016 Document Reviewed: 11/03/2015 Elsevier Interactive Patient Education  2018 [employer]  

## 2017-04-09 NOTE — Telephone Encounter (Signed)
Pt notified this AM that she had been experiencing increased diarrhea last night x 8. Discussed with Dr. Lindi Adie and okay to order 1L IVF today. Pt agreed to come in for 2pm appt and keep appt for labs and IVF tomorrow.

## 2017-04-09 NOTE — Progress Notes (Signed)
Call from [facility] that dicyclomine order was accidentally deleted. Prescription reordered.

## 2017-04-10 ENCOUNTER — Ambulatory Visit (HOSPITAL_BASED_OUTPATIENT_CLINIC_OR_DEPARTMENT_OTHER): Payer: Medicare Other

## 2017-04-10 ENCOUNTER — Other Ambulatory Visit: Payer: Self-pay

## 2017-04-10 ENCOUNTER — Other Ambulatory Visit: Payer: Self-pay | Admitting: *Deleted

## 2017-04-10 VITALS — BP 120/86 | HR 73 | Temp 97.8°F | Resp 18 | Ht 66.0 in | Wt 229.3 lb

## 2017-04-10 DIAGNOSIS — C383 Malignant neoplasm of mediastinum, part unspecified: Secondary | ICD-10-CM

## 2017-04-10 DIAGNOSIS — C8522 Mediastinal (thymic) large B-cell lymphoma, intrathoracic lymph nodes: Secondary | ICD-10-CM | POA: Diagnosis not present

## 2017-04-10 DIAGNOSIS — E860 Dehydration: Secondary | ICD-10-CM

## 2017-04-10 DIAGNOSIS — R197 Diarrhea, unspecified: Secondary | ICD-10-CM | POA: Diagnosis not present

## 2017-04-10 MED ORDER — SODIUM CHLORIDE 0.9 % IV SOLN: Freq: Once | INTRAVENOUS | Status: AC

## 2017-04-10 MED ORDER — HEPARIN SOD (PORK) LOCK FLUSH 100 UNIT/ML IV SOLN
500.0000 [IU] | Freq: Once | INTRAVENOUS | Status: AC
  Administered 2017-04-10: 500 [IU] via INTRAVENOUS
  Filled 2017-04-10: qty 5

## 2017-04-10 MED ORDER — SODIUM CHLORIDE 0.9 % IV SOLN
Freq: Once | INTRAVENOUS | Status: AC
  Administered 2017-04-10: 11:00:00 via INTRAVENOUS

## 2017-04-10 MED ORDER — LOPERAMIDE HCL 2 MG PO CAPS
ORAL_CAPSULE | ORAL | Status: AC
  Filled 2017-04-10: qty 2

## 2017-04-10 MED ORDER — LOPERAMIDE HCL 2 MG PO CAPS
4.0000 mg | ORAL_CAPSULE | Freq: Once | ORAL | Status: AC
  Administered 2017-04-10: 4 mg via ORAL

## 2017-04-10 MED ORDER — SODIUM CHLORIDE 0.9% FLUSH
10.0000 mL | Freq: Once | INTRAVENOUS | Status: AC
  Administered 2017-04-10: 10 mL via INTRAVENOUS
  Filled 2017-04-10: qty 10

## 2017-04-10 NOTE — Progress Notes (Signed)
Pt had an episode of diarrhea while here. Had not taken anything for diarrhea since 2am. Received order from Dr. Lindi Adie for imodium.  This was given to pt.

## 2017-04-10 NOTE — Patient Instructions (Signed)
Diarrhea, Adult Diarrhea is frequent loose and watery bowel movements. Diarrhea can make you feel weak and cause you to become dehydrated. Dehydration can make you tired and thirsty, cause you to have a dry mouth, and decrease how often you urinate. Diarrhea typically lasts 2-3 days. However, it can last longer if it is a sign of something more serious. It is important to treat your diarrhea as told by your health care provider. Follow these instructions at home: Eating and drinking Follow these recommendations as told by your health care provider:  Take an oral rehydration solution (ORS). This is a drink that is sold at pharmacies and retail stores.  Drink clear fluids, such as water, ice chips, diluted fruit juice, and low-calorie sports drinks.  Eat bland, easy-to-digest foods in small amounts as you are able. These foods include bananas, applesauce, rice, lean meats, toast, and crackers.  Avoid drinking fluids that contain a lot of sugar or caffeine, such as energy drinks, sports drinks, and soda.  Avoid alcohol.  Avoid spicy or fatty foods. General instructions  Drink enough fluid to keep your urine clear or pale yellow.  Wash your hands often. If soap and water are not available, use hand sanitizer.  Make sure that all people in your household wash their hands well and often.  Take over-the-counter and prescription medicines only as told by your health care provider.  Rest at home while you recover.  Watch your condition for any changes.  Take a warm bath to relieve any burning or pain from frequent diarrhea episodes.  Keep all follow-up visits as told by your health care provider. This is important. Contact a health care provider if:  You have a fever.  Your diarrhea gets worse.  You have new symptoms.  You cannot keep fluids down.  You feel light-headed or dizzy.  You have a headache  You have muscle cramps. Get help right away if:  You have chest  pain.  You feel extremely weak or you faint.  You have bloody or black stools or stools that look like tar.  You have severe pain, cramping, or bloating in your abdomen.  You have trouble breathing or you are breathing very quickly.  Your heart is beating very quickly.  Your skin feels cold and clammy.  You feel confused.  You have signs of dehydration, such as:  Dark urine, very little urine, or no urine.  Cracked lips.  Dry mouth.  Sunken eyes.  Sleepiness.  Weakness. This information is not intended to replace advice given to you by your health care provider. Make sure you discuss any questions you have with your health care provider. Document Released: 08/30/2002 Document Revised: 01/18/2016 Document Reviewed: 05/16/2015 Elsevier Interactive Patient Education  2017 [employer] Inc.  

## 2017-04-14 ENCOUNTER — Other Ambulatory Visit: Payer: Self-pay

## 2017-04-14 DIAGNOSIS — C8522 Mediastinal (thymic) large B-cell lymphoma, intrathoracic lymph nodes: Secondary | ICD-10-CM

## 2017-04-18 ENCOUNTER — Ambulatory Visit (INDEPENDENT_AMBULATORY_CARE_PROVIDER_SITE_OTHER): Payer: Medicare Other | Admitting: *Deleted

## 2017-04-18 DIAGNOSIS — Z7901 Long term (current) use of anticoagulants: Secondary | ICD-10-CM

## 2017-04-18 DIAGNOSIS — I6350 Cerebral infarction due to unspecified occlusion or stenosis of unspecified cerebral artery: Secondary | ICD-10-CM | POA: Diagnosis not present

## 2017-04-18 DIAGNOSIS — I4891 Unspecified atrial fibrillation: Secondary | ICD-10-CM | POA: Diagnosis not present

## 2017-04-18 DIAGNOSIS — Z5181 Encounter for therapeutic drug level monitoring: Secondary | ICD-10-CM

## 2017-04-18 LAB — POCT INR: INR: 5.1

## 2017-04-22 ENCOUNTER — Other Ambulatory Visit: Payer: Self-pay | Admitting: Emergency Medicine

## 2017-04-22 ENCOUNTER — Ambulatory Visit (INDEPENDENT_AMBULATORY_CARE_PROVIDER_SITE_OTHER): Payer: Medicare Other | Admitting: *Deleted

## 2017-04-22 DIAGNOSIS — I6350 Cerebral infarction due to unspecified occlusion or stenosis of unspecified cerebral artery: Secondary | ICD-10-CM | POA: Diagnosis not present

## 2017-04-22 DIAGNOSIS — Z5181 Encounter for therapeutic drug level monitoring: Secondary | ICD-10-CM | POA: Diagnosis not present

## 2017-04-22 DIAGNOSIS — Z7901 Long term (current) use of anticoagulants: Secondary | ICD-10-CM

## 2017-04-22 DIAGNOSIS — I4891 Unspecified atrial fibrillation: Secondary | ICD-10-CM | POA: Diagnosis not present

## 2017-04-22 DIAGNOSIS — C8522 Mediastinal (thymic) large B-cell lymphoma, intrathoracic lymph nodes: Secondary | ICD-10-CM

## 2017-04-22 LAB — POCT INR: INR: 1.9

## 2017-04-22 MED ORDER — PREDNISONE 20 MG PO TABS: 60.0000 mg | ORAL_TABLET | Freq: Every day | ORAL | 3 refills | Status: DC

## 2017-04-22 MED ORDER — ALLOPURINOL 300 MG PO TABS: 300.0000 mg | ORAL_TABLET | Freq: Every day | ORAL | 3 refills | Status: DC

## 2017-04-23 ENCOUNTER — Other Ambulatory Visit: Payer: Self-pay | Admitting: Internal Medicine

## 2017-04-23 ENCOUNTER — Other Ambulatory Visit (HOSPITAL_BASED_OUTPATIENT_CLINIC_OR_DEPARTMENT_OTHER): Payer: Medicare Other

## 2017-04-23 ENCOUNTER — Other Ambulatory Visit: Payer: Self-pay | Admitting: Hematology and Oncology

## 2017-04-23 ENCOUNTER — Ambulatory Visit (INDEPENDENT_AMBULATORY_CARE_PROVIDER_SITE_OTHER): Payer: Medicare Other | Admitting: Internal Medicine

## 2017-04-23 ENCOUNTER — Ambulatory Visit (HOSPITAL_BASED_OUTPATIENT_CLINIC_OR_DEPARTMENT_OTHER): Payer: Medicare Other

## 2017-04-23 VITALS — BP 112/76 | HR 96 | Ht 66.0 in | Wt 230.0 lb

## 2017-04-23 VITALS — BP 132/71 | HR 69 | Temp 97.8°F | Resp 17

## 2017-04-23 DIAGNOSIS — I480 Paroxysmal atrial fibrillation: Secondary | ICD-10-CM | POA: Diagnosis not present

## 2017-04-23 DIAGNOSIS — Z5111 Encounter for antineoplastic chemotherapy: Secondary | ICD-10-CM

## 2017-04-23 DIAGNOSIS — Z950 Presence of cardiac pacemaker: Secondary | ICD-10-CM | POA: Diagnosis not present

## 2017-04-23 DIAGNOSIS — Z5189 Encounter for other specified aftercare: Secondary | ICD-10-CM

## 2017-04-23 DIAGNOSIS — Z79899 Other long term (current) drug therapy: Secondary | ICD-10-CM

## 2017-04-23 DIAGNOSIS — I495 Sick sinus syndrome: Secondary | ICD-10-CM | POA: Diagnosis not present

## 2017-04-23 DIAGNOSIS — C8522 Mediastinal (thymic) large B-cell lymphoma, intrathoracic lymph nodes: Secondary | ICD-10-CM | POA: Diagnosis not present

## 2017-04-23 DIAGNOSIS — I6350 Cerebral infarction due to unspecified occlusion or stenosis of unspecified cerebral artery: Secondary | ICD-10-CM

## 2017-04-23 DIAGNOSIS — Z5112 Encounter for antineoplastic immunotherapy: Secondary | ICD-10-CM

## 2017-04-23 LAB — CUP PACEART INCLINIC DEVICE CHECK
Battery Voltage: 3.01 V
Brady Statistic AP VP Percent: 0 %
Brady Statistic RA Percent Paced: 0.03 %
Brady Statistic RV Percent Paced: 0.03 %
Implantable Lead Implant Date: 20140618
Implantable Lead Location: 753860
Implantable Pulse Generator Implant Date: 20140618
Lead Channel Impedance Value: 912 Ohm
Lead Channel Impedance Value: 969 Ohm
Lead Channel Pacing Threshold Amplitude: 0.625 V
Lead Channel Pacing Threshold Pulse Width: 0.4 ms
Lead Channel Pacing Threshold Pulse Width: 0.4 ms
Lead Channel Sensing Intrinsic Amplitude: 9.25 mV
Lead Channel Setting Pacing Amplitude: 2 V
Lead Channel Setting Sensing Sensitivity: 0.9 mV
MDC IDC LEAD IMPLANT DT: 20140618
MDC IDC LEAD LOCATION: 753859
MDC IDC MSMT BATTERY REMAINING LONGEVITY: 86 mo
MDC IDC MSMT LEADCHNL RA IMPEDANCE VALUE: 380 Ohm
MDC IDC MSMT LEADCHNL RA IMPEDANCE VALUE: 475 Ohm
MDC IDC MSMT LEADCHNL RA SENSING INTR AMPL: 1 mV
MDC IDC MSMT LEADCHNL RA SENSING INTR AMPL: 1.125 mV
MDC IDC MSMT LEADCHNL RV PACING THRESHOLD AMPLITUDE: 0.875 V
MDC IDC MSMT LEADCHNL RV SENSING INTR AMPL: 14 mV
MDC IDC SESS DTM: 20180801111407
MDC IDC SET LEADCHNL RV PACING AMPLITUDE: 2.5 V
MDC IDC SET LEADCHNL RV PACING PULSEWIDTH: 0.4 ms
MDC IDC STAT BRADY AP VS PERCENT: 0.03 %
MDC IDC STAT BRADY AS VP PERCENT: 0.03 %
MDC IDC STAT BRADY AS VS PERCENT: 99.94 %

## 2017-04-23 LAB — COMPREHENSIVE METABOLIC PANEL
ALT: 27 U/L (ref 0–55)
AST: 23 U/L (ref 5–34)
Albumin: 3.5 g/dL (ref 3.5–5.0)
Alkaline Phosphatase: 77 U/L (ref 40–150)
Anion Gap: 8 mEq/L (ref 3–11)
BUN: 14.7 mg/dL (ref 7.0–26.0)
CHLORIDE: 104 meq/L (ref 98–109)
CO2: 28 meq/L (ref 22–29)
CREATININE: 1.1 mg/dL (ref 0.6–1.1)
Calcium: 9.6 mg/dL (ref 8.4–10.4)
EGFR: 52 mL/min/{1.73_m2} — ABNORMAL LOW (ref >90)
GLUCOSE: 122 mg/dL (ref 70–140)
POTASSIUM: 4.7 meq/L (ref 3.5–5.1)
SODIUM: 140 meq/L (ref 136–145)
Total Bilirubin: 0.53 mg/dL (ref 0.20–1.20)
Total Protein: 6.6 g/dL (ref 6.4–8.3)

## 2017-04-23 LAB — CBC WITH DIFFERENTIAL/PLATELET
BASO%: 3.1 % — AB (ref 0.0–2.0)
Basophils Absolute: 0.2 10*3/uL — ABNORMAL HIGH (ref 0.0–0.1)
EOS%: 3.1 % (ref 0.0–7.0)
Eosinophils Absolute: 0.2 10*3/uL (ref 0.0–0.5)
HCT: 37.1 % (ref 34.8–46.6)
HGB: 11.9 g/dL (ref 11.6–15.9)
LYMPH%: 10.4 % — AB (ref 14.0–49.7)
MCH: 29.8 pg (ref 25.1–34.0)
MCHC: 32.1 g/dL (ref 31.5–36.0)
MCV: 93 fL (ref 79.5–101.0)
MONO#: 0.5 10*3/uL (ref 0.1–0.9)
MONO%: 6.9 % (ref 0.0–14.0)
NEUT%: 76.5 % (ref 38.4–76.8)
NEUTROS ABS: 5.7 10*3/uL (ref 1.5–6.5)
Platelets: 274 10*3/uL (ref 145–400)
RBC: 3.99 10*6/uL (ref 3.70–5.45)
RDW: 17.8 % — ABNORMAL HIGH (ref 11.2–14.5)
WBC: 7.5 10*3/uL (ref 3.9–10.3)
lymph#: 0.8 10*3/uL — ABNORMAL LOW (ref 0.9–3.3)
nRBC: 0 % (ref 0–0)

## 2017-04-23 MED ORDER — SODIUM CHLORIDE 0.9 % IV SOLN
Freq: Once | INTRAVENOUS | Status: AC
  Administered 2017-04-23: 12:00:00 via INTRAVENOUS

## 2017-04-23 MED ORDER — SODIUM CHLORIDE 0.9 % IV SOLN
375.0000 mg/m2 | Freq: Once | INTRAVENOUS | Status: AC
  Administered 2017-04-23: 800 mg via INTRAVENOUS
  Filled 2017-04-23: qty 50

## 2017-04-23 MED ORDER — DIPHENHYDRAMINE HCL 25 MG PO CAPS
ORAL_CAPSULE | ORAL | Status: AC
  Filled 2017-04-23: qty 2

## 2017-04-23 MED ORDER — DOXORUBICIN HCL CHEMO IV INJECTION 2 MG/ML
40.0000 mg/m2 | Freq: Once | INTRAVENOUS | Status: AC
  Administered 2017-04-23: 88 mg via INTRAVENOUS
  Filled 2017-04-23: qty 44

## 2017-04-23 MED ORDER — PALONOSETRON HCL INJECTION 0.25 MG/5ML
0.2500 mg | Freq: Once | INTRAVENOUS | Status: AC
  Administered 2017-04-23: 0.25 mg via INTRAVENOUS

## 2017-04-23 MED ORDER — VINCRISTINE SULFATE CHEMO INJECTION 1 MG/ML
2.0000 mg | Freq: Once | INTRAVENOUS | Status: AC
  Administered 2017-04-23: 2 mg via INTRAVENOUS
  Filled 2017-04-23: qty 2

## 2017-04-23 MED ORDER — HEPARIN SOD (PORK) LOCK FLUSH 100 UNIT/ML IV SOLN
500.0000 [IU] | Freq: Once | INTRAVENOUS | Status: AC | PRN
  Administered 2017-04-23: 500 [IU]
  Filled 2017-04-23: qty 5

## 2017-04-23 MED ORDER — DIPHENHYDRAMINE HCL 25 MG PO CAPS
50.0000 mg | ORAL_CAPSULE | Freq: Once | ORAL | Status: AC
  Administered 2017-04-23: 50 mg via ORAL

## 2017-04-23 MED ORDER — PALONOSETRON HCL INJECTION 0.25 MG/5ML
INTRAVENOUS | Status: AC
  Filled 2017-04-23: qty 5

## 2017-04-23 MED ORDER — ACETAMINOPHEN 325 MG PO TABS
650.0000 mg | ORAL_TABLET | Freq: Once | ORAL | Status: AC
  Administered 2017-04-23: 650 mg via ORAL

## 2017-04-23 MED ORDER — PEGFILGRASTIM 6 MG/0.6ML ~~LOC~~ PSKT
6.0000 mg | PREFILLED_SYRINGE | Freq: Once | SUBCUTANEOUS | Status: AC
  Administered 2017-04-23: 6 mg via SUBCUTANEOUS
  Filled 2017-04-23: qty 0.6

## 2017-04-23 MED ORDER — DEXAMETHASONE SODIUM PHOSPHATE 10 MG/ML IJ SOLN
10.0000 mg | Freq: Once | INTRAMUSCULAR | Status: AC
  Administered 2017-04-23: 10 mg via INTRAVENOUS

## 2017-04-23 MED ORDER — DEXAMETHASONE SODIUM PHOSPHATE 10 MG/ML IJ SOLN
INTRAMUSCULAR | Status: AC
  Filled 2017-04-23: qty 1

## 2017-04-23 MED ORDER — ACETAMINOPHEN 325 MG PO TABS
ORAL_TABLET | ORAL | Status: AC
  Filled 2017-04-23: qty 2

## 2017-04-23 MED ORDER — SODIUM CHLORIDE 0.9 % IV SOLN
600.0000 mg/m2 | Freq: Once | INTRAVENOUS | Status: AC
  Administered 2017-04-23: 1320 mg via INTRAVENOUS
  Filled 2017-04-23: qty 66

## 2017-04-23 MED ORDER — SODIUM CHLORIDE 0.9% FLUSH
10.0000 mL | INTRAVENOUS | Status: DC | PRN
  Administered 2017-04-23: 10 mL
  Filled 2017-04-23: qty 10

## 2017-04-23 NOTE — Patient Instructions (Signed)
Medication Instructions: - Your physician has recommended you make the following change in your medication:  1) We will be switching you over to eliquis 5 mg twice daily- please continue your coumadin until your next appointment with the a-fib clinic on Monday and they will help transition you over to eliquis  Labwork: - Your physician recommends that you have lab work today: BNP/ TSH  Procedures/Testing: - none ordered  Follow-Up: - Your physician recommends that you schedule a follow-up appointment in: 4 weeks with [name], NP in the a-fib clinic  - Your physician recommends that you schedule a follow-up appointment in: 8 weeks with Dr. Caryl Comes.    Any Additional Special Instructions Will Be Listed Below (If Applicable).     If you need a refill on your cardiac medications before your next appointment, please call your pharmacy.

## 2017-04-23 NOTE — Progress Notes (Signed)
Patient tolerated treatment well. Patient and vital signs stable upon discharge.

## 2017-04-23 NOTE — Patient Instructions (Signed)
[name] Discharge Instructions for Patients Receiving Chemotherapy  Today you received the following chemotherapy agents Adriamycin/Vincristine/Cytoxan/Rituxan To help prevent nausea and vomiting after your treatment, we encourage you to take your nausea medication as prescribed.   If you develop nausea and vomiting that is not controlled by your nausea medication, call the clinic.   BELOW ARE SYMPTOMS THAT SHOULD BE REPORTED IMMEDIATELY:  *FEVER GREATER THAN 100.5 F  *CHILLS WITH OR WITHOUT FEVER  NAUSEA AND VOMITING THAT IS NOT CONTROLLED WITH YOUR NAUSEA MEDICATION  *UNUSUAL SHORTNESS OF BREATH  *UNUSUAL BRUISING OR BLEEDING  TENDERNESS IN MOUTH AND THROAT WITH OR WITHOUT PRESENCE OF ULCERS  *URINARY PROBLEMS  *BOWEL PROBLEMS  UNUSUAL RASH Items with * indicate a potential emergency and should be followed up as soon as possible.  Feel free to call the clinic you have any questions or concerns. The clinic phone number is [phone].  Please show the [name] at check-in to the Emergency Department and triage nurse.

## 2017-04-23 NOTE — Progress Notes (Signed)
Patient Care Team: [name], MD as PCP - General (Endocrinology)   HPI  Kayla Harrison is a 71 y.o. female Seen in followup for a pacemaker implanted at [facility] and atrial fibrillation  She underwent pulmonary vein isolation at [facility] 2009 and had recurrent atrial fibrillation. She underwent convergent ablation at [facility] 9/73; it was complicated by heart block prompting pacemaker implantation.     She has a history of prior strokes and remains on oral anticoagulation . She was not tolerated  NOACs and remains on warfarin   5/18 presented w SOB and facial fullness, found to have SVC syndrome 2/2 NHlymphoma Rx initially with XRT  She was restarted on amio--she has had recurrent aFib  Some sob which worsens periodically also fatigue  Vague fleeting chest pains, duration secs  TSH not yet rechecked   Echo 2011 EF 55% with mod LAE-26m  Echo 7/14 normal EF Echo 5/18 normal EF     She also has a history of morbid obesity status post lap band surgery    Past Medical History:  Diagnosis Date  . Abnormally small mouth    per pt "I have a small mouth"  . Arthritis    osteoarthritis - knees   . Atrial fibrillation ([facility]    ablation- 2x's-- [facility]  . Breast cancer ([facility]    Dr SMargot Chimes total thyroidectomy- 1999- for cancer  . Colon polyp    Dr MEarlean Shawl . Complication of anesthesia    Ketamine produces LSD reaction, bright colored nightmarish experience   . Dyslipidemia   . Dysrhythmia    a fib with internal/external ablation, currently NSR  . H/O pleural effusion    s/p thoracentesis w [id number]  . Hepatitis    [place]hepatitis   . History of dysphagia    due to radiation therapy  . History of kidney stones   . Hx of thyroid cancer    Dr SoForde Dandy. Hyperlipidemia   . Hypothyroidism   . [place]   radiation RX  completed 03/04/17; will start chemo 6/27  . Morbid obesity ([facility]   Status post lap band surgery  . Normal cardiac stress test    2008- cardiac stress/echo  . Pacemaker-Medtronic   . [facility]   . Renal calculi   . Right shoulder pain    needs shoulder replacement  . Sinus node dysfunction ([facility]   Complicating convergent ablation 6/14  . [facility]   [place]  . Wears glasses    readers    Past Surgical History:  Procedure Laterality Date  . ABDOMINAL HYSTERECTOMY  1983  . afib ablation     X 2; [facility] & Dr KlCaryl Comes. APPENDECTOMY    . BONE MARROW BIOPSY  02/21/2017  . BREAST LUMPECTOMY  2010  . bso  1998  . CARDIOVERSION  10/09/2012   Procedure: CARDIOVERSION;  Surgeon: JaMinus BreedingMD;  [facility] Service: Cardiovascular;  Laterality: N/A;  . CARDIOVERSION  10/09/2012   Procedure: CARDIOVERSION;  Surgeon: JaMinus BreedingMD;  [facility];  Service: Cardiovascular;  Laterality: N/A;  MiRonalee Beltsave the ok to add pt to the add on , but we must check to find out if the can add  pt on at [phone] ( [phone])  . CARDIOVERSION N/A 11/20/2012   Procedure: CARDIOVERSION;  Surgeon: Pricilla Riffle, MD;  Location: [facility] ENDOSCOPY;  Service: Cardiovascular;  Laterality: N/A;  . CHOLECYSTECTOMY    . COLONOSCOPY W/ POLYPECTOMY     Dr Kinnie Scales  . convergent    . CYSTOSCOPY N/A 02/06/2015   Procedure: CYSTOSCOPY;  Surgeon: Ihor Gully, MD;  [facility]S;  Service: Urology;  Laterality: N/A;  . CYSTOSCOPY WITH RETROGRADE PYELOGRAM, URETEROSCOPY AND STENT PLACEMENT Right 02/06/2015   Procedure: RETROGRADE PYELOGRAM, RIGHT URETEROSCOPY STENT PLACEMENT;  Surgeon: Ihor Gully, MD;  Location: [facility];  Service: Urology;  Laterality: Right;  . CYSTOSCOPY WITH RETROGRADE PYELOGRAM, URETEROSCOPY AND STENT PLACEMENT Right 03/07/2017   Procedure: CYSTOSCOPY WITH RIGHT RETROGRADE PYELOGRAM,RIGHT  URETEROSCOPYLASER LITHOTRIPSY  AND STENT  PLACEMENT AND STONE BASKETRY;  Surgeon: Ihor Gully, MD;  [facility];  Service: Urology;  Laterality: Right;  . EYE SURGERY     cataract surgery  . HOLMIUM LASER APPLICATION N/A 02/06/2015   Procedure: HOLMIUM LASER APPLICATION;  Surgeon: Ihor Gully, MD;  Location: [facility];  Service: Urology;  Laterality: N/A;  . HOLMIUM LASER APPLICATION Right 03/07/2017   Procedure: HOLMIUM LASER APPLICATION;  Surgeon: Ihor Gully, MD;  [facility];  Service: Urology;  Laterality: Right;  . IR FLUORO GUIDE PORT INSERTION RIGHT  02/24/2017  . IR PATIENT EVAL TECH 0-60 MINS  03/11/2017  . IR US GUIDE VASC ACCESS RIGHT  02/24/2017  . JOINT REPLACEMENT  04/13/12   Right Knee replacement  . KNEE ARTHROSCOPY     bilateral  . LAPAROSCOPIC GASTRIC BANDING  07/10/2010  . LAPAROTOMY     for ruptured ovary and ovarian artery   . PACEMAKER INSERTION  03/10/13   [facility]  . POCKET REVISION N/A 12/08/2013   Procedure: POCKET REVISION;  Surgeon: Duke Salvia, MD;  Location: [facility] CATH LAB;  Service: Cardiovascular;  Laterality: N/A;  . THYROIDECTOMY  1998   Dr Jamey Ripa  . TONSILLECTOMY    . TOTAL KNEE ARTHROPLASTY  04/13/2012   Procedure: TOTAL KNEE ARTHROPLASTY;  Surgeon: Raymon Mutton, MD;  Location: [facility] OR;  Service: Orthopedics;  Laterality: Right;  . Transabdominal ablation  03/02/13   [facility]  . VIDEO BRONCHOSCOPY WITH ENDOBRONCHIAL ULTRASOUND N/A 02/07/2017   Procedure: VIDEO BRONCHOSCOPY WITH ENDOBRONCHIAL ULTRASOUND;  Surgeon: Chilton Greathouse, MD;  Location: [facility] OR;  Service: Pulmonary;  Laterality: N/A;    Current Outpatient Prescriptions  Medication Sig Dispense Refill  . acetaminophen (TYLENOL 8 HOUR) 650 MG CR tablet Take 1 tablet (650 mg total) by mouth every 8 (eight) hours as needed for pain. 30 tablet 0  . allopurinol (ZYLOPRIM) 300 MG tablet Take 1 tablet (300 mg total) by mouth daily. 30 tablet 3  . amiodarone (PACERONE) 200 MG tablet Take 1 tablet (200  mg total) by mouth daily. 90 tablet 3  . cycloSPORINE (RESTASIS) 0.05 % ophthalmic emulsion Place 1 drop into both eyes 2 (two) times daily.    Marland Kitchen dicyclomine (BENTYL) 20 MG tablet Take 1 tablet (20 mg total) by mouth every 6 (six) hours. As needed 60 tablet 1  . diphenhydramine-acetaminophen (TYLENOL PM) 25-500 MG TABS tablet Take 1 tablet by mouth at bedtime as needed.    . diphenoxylate-atropine (LOMOTIL) 2.5-0.025 MG tablet Take 2 tablets by mouth 4 (four) times daily as needed for diarrhea or loose stools. 30 tablet 0  . furosemide (LASIX) 40 MG tablet Take 40 mg by  mouth daily as needed for fluid.     Marland Kitchen levothyroxine (SYNTHROID, LEVOTHROID) 175 MCG tablet Take 175 mcg by mouth daily before breakfast.    . lidocaine-prilocaine (EMLA) cream Apply to affected area once 30 g 3  . Lifitegrast (XIIDRA) 5 % SOLN Apply 1 drop to eye 2 (two) times daily.    Marland Kitchen LORazepam (ATIVAN) 0.5 MG tablet Take 1 tablet (0.5 mg total) by mouth at bedtime. 30 tablet 0  . Multiple Vitamin (MULTIVITAMIN) tablet Take 1 tablet by mouth daily.     . ondansetron (ZOFRAN) 8 MG tablet Take 1 tablet (8 mg total) by mouth 2 (two) times daily as needed for refractory nausea / vomiting. Start on day 3 after cyclophosphamide chemotherapy. 30 tablet 1  . Oxycodone HCl 10 MG TABS Take 1 tablet (10 mg total) by mouth every 4 (four) hours as needed. 20 tablet 0  . oxyCODONE-acetaminophen (PERCOCET/ROXICET) 5-325 MG tablet Take 1 tablet by mouth every 8 (eight) hours as needed for severe pain. 30 tablet 0  . predniSONE (DELTASONE) 20 MG tablet Take 3 tablets (60 mg total) by mouth daily. Take on days 1-5 of chemotherapy. 30 tablet 3  . prochlorperazine (COMPAZINE) 10 MG tablet Take 1 tablet (10 mg total) by mouth every 6 (six) hours as needed (Nausea or vomiting). 30 tablet 6  . sucralfate (CARAFATE) 1 g tablet Take 1 tablet (1 g total) by mouth 4 (four) times daily. 120 tablet 2  . traMADol (ULTRAM) 50 MG tablet Take 1-2 tablets  (50-100 mg total) by mouth every 6 (six) hours as needed. (Patient not taking: Reported on 04/10/2017) 60 tablet 0  . warfarin (COUMADIN) 5 MG tablet Take 1 tablet daily except 1/2 tablet on Tues, Thurs, Sat or as directed by [facility] 90 tablet 1   No current facility-administered medications for this visit.     Allergies  Allergen Reactions  . Tikosyn [Dofetilide] Other (See Comments)    Prolonged QT interval Prolonged QT interval  . Xarelto [Rivaroxaban] Other (See Comments)    Nose Bleed X 6 hrs ; packing in ER  . Epinephrine Other (See Comments) and Rash    Dental form only (liquid). Patient stated she will become "out of it" she can hear you but cannot respond in normal fashion.  Marland Kitchen Ketamine     Hallucinations  . Eliquis [Apixaban] Other (See Comments)    "BLEEDING IN URINE"  . Adhesive [Tape] Rash    Paper tape as well    Review of Systems negative except from HPI and PMH  Physical Exam BP 112/76   Pulse 96   Ht '5\' 6"'$  (1.676 m)   Wt 230 lb (104.3 kg)   SpO2 97%   BMI 37.12 kg/m   Well developed and nourished in no acute distress HENT normal Neck supple with JVP-8-10 Chest wall engorgement but no arm vein engorgement Carotids brisk and full without bruits Clear Regular rate and rhythm, no murmurs or gallops Abd-soft with active BS without hepatomegaly No Clubbing cyanosis 1 edema Skin-warm and dry A & Oriented  Grossly normal sensory and motor function  ECG was ordered today sinus 81 18/09/40    Assessment and  Plan  Atrial fibrillation  S/p convergent ablation paroxysmal  Prior Strokes   Orthostatic dizziness  Pacemaker Medtronic MRI compatible   Labile INR  Non-Hodgkin's lymphoma on chemotherapy  Dyspnea on exertion  HFpEF  Treated hypothyroidism  High Risk Medication Surveillance  Chest pain  The patient is having  recurrent symptomatic atrial fibrillation. We will increase her amiodarone 200--400 mg a day for the duration of her  chemotherapy-likely November.  We will check her TSH.  She has some evidence of volume overload with jugular venous distention peripheral edema as well as dyspnea on exertion. She says that she has had a low albumin. I'm not sure what the driver is here; we will check a BNP. A chest x-ray may be of value subsequently.  Her dizziness becomes abruptly with standing he is concerning. Orthostatic hypotension. She is chemotherapy scheduled in about a half an hour. We will ask the oncology clinic.   Chest pain appears noncardiac  More than 50% of 40 min was spent in counseling related to the above

## 2017-04-23 NOTE — Progress Notes (Signed)
Patient complains of diarrhea x 8 days. Patient reports loose stools with form x 2 days. Patient reports 2 episodes of loose stools x 2 days. Patient reports some lightheadedness that does not interfere with her ADLs. Patient states Dr. Lindi Adie was going to decrease her dose of her chemotherapy today. Dr. Lindi Adie notified. Ok to treat patient with chemotherapy today and he is decreasing the dose of chemotherapy. Patient verbalized understanding.   Adriamycin administered through a free dripping normal saline line. Positive blood return noted before, every 5 mL during and after Adriamycin administration. Patient tolerated well.

## 2017-04-25 ENCOUNTER — Encounter: Payer: Self-pay | Admitting: Internal Medicine

## 2017-04-25 LAB — SPECIMEN STATUS REPORT

## 2017-04-25 LAB — BRAIN NATRIURETIC PEPTIDE: BNP: 113.8 pg/mL — AB (ref 0.0–100.0)

## 2017-04-26 ENCOUNTER — Encounter: Payer: Self-pay | Admitting: Internal Medicine

## 2017-04-28 ENCOUNTER — Other Ambulatory Visit: Payer: Self-pay

## 2017-04-28 ENCOUNTER — Ambulatory Visit (INDEPENDENT_AMBULATORY_CARE_PROVIDER_SITE_OTHER): Payer: Medicare Other | Admitting: Pharmacist

## 2017-04-28 DIAGNOSIS — I4891 Unspecified atrial fibrillation: Secondary | ICD-10-CM

## 2017-04-28 DIAGNOSIS — Z7901 Long term (current) use of anticoagulants: Secondary | ICD-10-CM

## 2017-04-28 DIAGNOSIS — Z5181 Encounter for therapeutic drug level monitoring: Secondary | ICD-10-CM

## 2017-04-28 DIAGNOSIS — C8522 Mediastinal (thymic) large B-cell lymphoma, intrathoracic lymph nodes: Secondary | ICD-10-CM

## 2017-04-28 DIAGNOSIS — I6350 Cerebral infarction due to unspecified occlusion or stenosis of unspecified cerebral artery: Secondary | ICD-10-CM

## 2017-04-28 LAB — SPECIMEN STATUS REPORT

## 2017-04-28 LAB — TSH: TSH: 0.556 u[IU]/mL (ref 0.450–4.500)

## 2017-04-28 LAB — POCT INR: INR: 2.2

## 2017-04-28 MED ORDER — APIXABAN 5 MG PO TABS: 5.0000 mg | ORAL_TABLET | Freq: Two times a day (BID) | ORAL | 0 refills | Status: DC

## 2017-04-28 MED ORDER — ALLOPURINOL 300 MG PO TABS: 300.0000 mg | ORAL_TABLET | Freq: Every day | ORAL | 3 refills | Status: DC

## 2017-04-28 MED ORDER — APIXABAN 5 MG PO TABS: 5.0000 mg | ORAL_TABLET | Freq: Two times a day (BID) | ORAL | 1 refills | Status: DC

## 2017-04-28 MED ORDER — PREDNISONE 20 MG PO TABS: 60.0000 mg | ORAL_TABLET | Freq: Every day | ORAL | 3 refills | Status: DC

## 2017-04-30 ENCOUNTER — Encounter: Payer: Self-pay | Admitting: Radiation Oncology

## 2017-04-30 ENCOUNTER — Ambulatory Visit
Admission: RE | Admit: 2017-04-30 | Discharge: 2017-04-30 | Disposition: A | Discharge status: Home / Self Care | Payer: Medicare Other | Source: Ambulatory Visit | Attending: Radiation Oncology | Admitting: Radiation Oncology

## 2017-04-30 VITALS — BP 120/59 | HR 82 | Temp 98.0°F | Resp 18 | Ht 66.0 in | Wt 229.5 lb

## 2017-04-30 DIAGNOSIS — R600 Localized edema: Secondary | ICD-10-CM | POA: Diagnosis not present

## 2017-04-30 DIAGNOSIS — C8522 Mediastinal (thymic) large B-cell lymphoma, intrathoracic lymph nodes: Secondary | ICD-10-CM | POA: Diagnosis not present

## 2017-04-30 DIAGNOSIS — Z888 Allergy status to other drugs, medicaments and biological substances status: Secondary | ICD-10-CM | POA: Insufficient documentation

## 2017-04-30 DIAGNOSIS — R42 Dizziness and giddiness: Secondary | ICD-10-CM | POA: Insufficient documentation

## 2017-04-30 DIAGNOSIS — Z79899 Other long term (current) drug therapy: Secondary | ICD-10-CM | POA: Insufficient documentation

## 2017-04-30 DIAGNOSIS — Z9109 Other allergy status, other than to drugs and biological substances: Secondary | ICD-10-CM | POA: Diagnosis not present

## 2017-04-30 DIAGNOSIS — Z7902 Long term (current) use of antithrombotics/antiplatelets: Secondary | ICD-10-CM | POA: Diagnosis not present

## 2017-04-30 DIAGNOSIS — Z9889 Other specified postprocedural states: Secondary | ICD-10-CM | POA: Diagnosis not present

## 2017-04-30 DIAGNOSIS — C383 Malignant neoplasm of mediastinum, part unspecified: Secondary | ICD-10-CM

## 2017-04-30 NOTE — Progress Notes (Addendum)
Radiation Oncology         [phone] ________________________________  Name: Kayla Harrison MRN: [id number]  Date: 04/30/2017  DOB: 02/23/1946  Post Treatment Note  CC: [name], MD  Bonnielee Haff, MD  Diagnosis:  Non-Hodgkin B-cell Lymphoma of the intrathoracic lymph nodes  Interval Since Last Radiation: 8 weeks   02/10/17 - 03/04/17: 1) Mediastinum: 9 Gy in 3 fractions 2) Mediastinal boost: 26 Gy in 13 fractions  Narrative:  The patient returns today for routine follow-up.  The patient was originally found to have a mediastinal mass with exam findings concerning for SVC syndrome after undergoing a recent ablation for A. Fib. She was found on CT imaging to have a 5.4 x 7.1 x 6 cm in the mediastinum. This narrowed the SVC and interfaced with the ascending aorta, trachea, and right mainstem bronchus. Small nodes were seen in the subcarinal and left hilar region. A biopsy of this mass confirmed B Cell Lymphoma. She was offered urgent radiotherapy and completed this course on 03/04/17. She continues on R-CHOP chemotherapy with Dr. Lindi Adie.                   On review of systems, the patient states she's doing ok. She has noticed improvement in her diarrhea with sandostatin she received. She has met with Dr. Caryl Comes and reports that he attributes her lower extremity edema to low normal albumin and she's trying to increase her oral intake of protein rich foods. She reports feeling fullness in her SVC and chest. She reports that her edema is much improved in her upper extremities and neck. Occasionally she notes fullness in the right neck, but reports this resolves without intervention. She also notes chemo brain and describes this as fogginess as well as some visual changes that cause dizziness when she looks down or up. She denies any loss of vision. No other complaints of headache or nausea, or speech changes are reported.   ALLERGIES:  is allergic to tikosyn [dofetilide]; xarelto  [rivaroxaban]; epinephrine; ketamine; eliquis [apixaban]; and adhesive [tape].  Meds: Current Outpatient Prescriptions  Medication Sig Dispense Refill  . allopurinol (ZYLOPRIM) 300 MG tablet Take 1 tablet (300 mg total) by mouth daily. 30 tablet 3  . amiodarone (PACERONE) 200 MG tablet Take 1 tablet (200 mg total) by mouth daily. 90 tablet 3  . apixaban (ELIQUIS) 5 MG TABS tablet Take 1 tablet (5 mg total) by mouth 2 (two) times daily. 180 tablet 1  . cycloSPORINE (RESTASIS) 0.05 % ophthalmic emulsion Place 1 drop into both eyes 2 (two) times daily.    . diphenhydramine-acetaminophen (TYLENOL PM) 25-500 MG TABS tablet Take 1 tablet by mouth at bedtime as needed.    . diphenoxylate-atropine (LOMOTIL) 2.5-0.025 MG tablet Take 2 tablets by mouth 4 (four) times daily as needed for diarrhea or loose stools. 30 tablet 0  . levothyroxine (SYNTHROID, LEVOTHROID) 175 MCG tablet Take 175 mcg by mouth daily before breakfast.    . lidocaine-prilocaine (EMLA) cream Apply to affected area once 30 g 3  . Lifitegrast (XIIDRA) 5 % SOLN Apply 1 drop to eye 2 (two) times daily.    Marland Kitchen LORazepam (ATIVAN) 0.5 MG tablet Take 1 tablet (0.5 mg total) by mouth at bedtime. 30 tablet 0  . Multiple Vitamin (MULTIVITAMIN) tablet Take 1 tablet by mouth daily.     Marland Kitchen oxyCODONE-acetaminophen (PERCOCET/ROXICET) 5-325 MG tablet Take 1 tablet by mouth every 8 (eight) hours as needed for severe pain. 30 tablet 0  .  predniSONE (DELTASONE) 20 MG tablet Take 3 tablets (60 mg total) by mouth daily. Take on days 1-5 of chemotherapy. 30 tablet 3  . acetaminophen (TYLENOL 8 HOUR) 650 MG CR tablet Take 1 tablet (650 mg total) by mouth every 8 (eight) hours as needed for pain. (Patient not taking: Reported on 04/30/2017) 30 tablet 0  . dicyclomine (BENTYL) 20 MG tablet Take 1 tablet (20 mg total) by mouth every 6 (six) hours. As needed (Patient not taking: Reported on 04/30/2017) 60 tablet 1  . furosemide (LASIX) 40 MG tablet Take 40 mg by mouth  daily as needed for fluid.     Marland Kitchen ondansetron (ZOFRAN) 8 MG tablet Take 1 tablet (8 mg total) by mouth 2 (two) times daily as needed for refractory nausea / vomiting. Start on day 3 after cyclophosphamide chemotherapy. (Patient not taking: Reported on 04/30/2017) 30 tablet 1  . Oxycodone HCl 10 MG TABS Take 1 tablet (10 mg total) by mouth every 4 (four) hours as needed. (Patient not taking: Reported on 04/30/2017) 20 tablet 0  . prochlorperazine (COMPAZINE) 10 MG tablet Take 1 tablet (10 mg total) by mouth every 6 (six) hours as needed (Nausea or vomiting). (Patient not taking: Reported on 04/30/2017) 30 tablet 6  . sucralfate (CARAFATE) 1 g tablet Take 1 tablet (1 g total) by mouth 4 (four) times daily. (Patient not taking: Reported on 04/30/2017) 120 tablet 2  . traMADol (ULTRAM) 50 MG tablet Take 1-2 tablets (50-100 mg total) by mouth every 6 (six) hours as needed. (Patient not taking: Reported on 04/10/2017) 60 tablet 0   No current facility-administered medications for this encounter.     Physical Findings:  height is '5\' 6"'$  (1.676 m) and weight is 229 lb 8 oz (104.1 kg). Her oral temperature is 98 F (36.7 C). Her blood pressure is 120/59 (abnormal) and her pulse is 82. Her respiration is 18 and oxygen saturation is 98%.  Pain Assessment Pain Score: 0-No pain/10 In general this is a well appearing caucasian female in no acute distress. She's alert and oriented x4 and appropriate throughout the examination. Cardiopulmonary assessment reveals a regular rate and rhythm. No clicks, rubs, or murmurs are noted. Chest is clear to auscultation bilaterallly. Her previously dilated chest wall veins have improved and are much less visible than prior to her radiation. She also has no pitting edema of her upper extremities, and her neck and chin are normal in appearance without visible edema. She has bilateral lower extremity edema that appears equilateral.  Lab Findings: Lab Results  Component Value Date   WBC  7.5 04/23/2017   HGB 11.9 04/23/2017   HCT 37.1 04/23/2017   MCV 93.0 04/23/2017   PLT 274 04/23/2017     Radiographic Findings: No results found.  Impression/Plan: 1. Non-Hodgkin B-cell Lymphoma of the intrathoracic lymph nodes. The patient has recovered well since completing radiotherapy, and continues with Dr. Lindi Adie. Clinically it appears that she's had regression of disease. She will have restaging scans following completion of chemotherapy, and she will ask if this is post cycle #3 at her next visit with Dr. Lindi Adie. I've encouraged her to discuss her concerns about chemo brain with Dr. Lindi Adie as well.  We would be happy to see her back as needed moving forward.  2. Dizziness. What she describes may be due to arthritic changes in her neck or due to benign positional vertigo. I've encouraged her to discuss this with her PCP.  3. Bilateral lower extremity edema. She will  follow up with Dr. Caryl Comes and continue to increase protein rich foods in her diet.    Carola Rhine, PAC

## 2017-04-30 NOTE — Addendum Note (Signed)
Encounter addended by: Malena Edman, RN on: 04/30/2017 11:18 AM<BR>    Actions taken: Charge Capture section accepted

## 2017-05-05 ENCOUNTER — Telehealth: Payer: Self-pay

## 2017-05-05 NOTE — Telephone Encounter (Signed)
Spoke with patients sister Santiago Glad ([facility]) who relayed normal results to patient while I was on the phone with her. Patient was very thankful for call.

## 2017-05-14 ENCOUNTER — Ambulatory Visit: Payer: Medicare Other

## 2017-05-14 ENCOUNTER — Ambulatory Visit (HOSPITAL_BASED_OUTPATIENT_CLINIC_OR_DEPARTMENT_OTHER): Payer: Medicare Other | Admitting: Hematology and Oncology

## 2017-05-14 ENCOUNTER — Encounter: Payer: Self-pay | Admitting: Hematology and Oncology

## 2017-05-14 ENCOUNTER — Other Ambulatory Visit: Payer: Self-pay

## 2017-05-14 ENCOUNTER — Ambulatory Visit (HOSPITAL_BASED_OUTPATIENT_CLINIC_OR_DEPARTMENT_OTHER): Payer: Medicare Other

## 2017-05-14 ENCOUNTER — Other Ambulatory Visit (HOSPITAL_BASED_OUTPATIENT_CLINIC_OR_DEPARTMENT_OTHER): Payer: Medicare Other

## 2017-05-14 VITALS — BP 132/73 | HR 74 | Temp 98.3°F | Resp 18

## 2017-05-14 VITALS — BP 126/73 | HR 81 | Temp 97.7°F | Resp 18 | Ht 66.0 in | Wt 229.2 lb

## 2017-05-14 DIAGNOSIS — C8522 Mediastinal (thymic) large B-cell lymphoma, intrathoracic lymph nodes: Secondary | ICD-10-CM

## 2017-05-14 DIAGNOSIS — D696 Thrombocytopenia, unspecified: Secondary | ICD-10-CM

## 2017-05-14 DIAGNOSIS — Z5189 Encounter for other specified aftercare: Secondary | ICD-10-CM | POA: Diagnosis not present

## 2017-05-14 DIAGNOSIS — Z5112 Encounter for antineoplastic immunotherapy: Secondary | ICD-10-CM | POA: Diagnosis not present

## 2017-05-14 DIAGNOSIS — R51 Headache: Secondary | ICD-10-CM | POA: Diagnosis not present

## 2017-05-14 DIAGNOSIS — R530 Neoplastic (malignant) related fatigue: Secondary | ICD-10-CM | POA: Diagnosis not present

## 2017-05-14 DIAGNOSIS — D701 Agranulocytosis secondary to cancer chemotherapy: Secondary | ICD-10-CM

## 2017-05-14 DIAGNOSIS — C383 Malignant neoplasm of mediastinum, part unspecified: Secondary | ICD-10-CM

## 2017-05-14 DIAGNOSIS — E860 Dehydration: Secondary | ICD-10-CM | POA: Diagnosis not present

## 2017-05-14 DIAGNOSIS — Z5111 Encounter for antineoplastic chemotherapy: Secondary | ICD-10-CM

## 2017-05-14 DIAGNOSIS — K521 Toxic gastroenteritis and colitis: Secondary | ICD-10-CM

## 2017-05-14 LAB — CBC WITH DIFFERENTIAL/PLATELET
BASO%: 0.5 % (ref 0.0–2.0)
BASOS ABS: 0 10*3/uL (ref 0.0–0.1)
EOS ABS: 0.2 10*3/uL (ref 0.0–0.5)
EOS%: 3.3 % (ref 0.0–7.0)
HEMATOCRIT: 32.6 % — AB (ref 34.8–46.6)
HEMOGLOBIN: 10.8 g/dL — AB (ref 11.6–15.9)
LYMPH#: 0.3 10*3/uL — AB (ref 0.9–3.3)
LYMPH%: 4.7 % — ABNORMAL LOW (ref 14.0–49.7)
MCH: 30.8 pg (ref 25.1–34.0)
MCHC: 33 g/dL (ref 31.5–36.0)
MCV: 93.3 fL (ref 79.5–101.0)
MONO#: 1 10*3/uL — ABNORMAL HIGH (ref 0.1–0.9)
MONO%: 14.2 % — ABNORMAL HIGH (ref 0.0–14.0)
NEUT#: 5.5 10*3/uL (ref 1.5–6.5)
NEUT%: 77.3 % — AB (ref 38.4–76.8)
PLATELETS: 232 10*3/uL (ref 145–400)
RBC: 3.49 10*6/uL — ABNORMAL LOW (ref 3.70–5.45)
RDW: 22.6 % — AB (ref 11.2–14.5)
WBC: 7.2 10*3/uL (ref 3.9–10.3)

## 2017-05-14 LAB — COMPREHENSIVE METABOLIC PANEL
ALBUMIN: 3.3 g/dL — AB (ref 3.5–5.0)
ALK PHOS: 78 U/L (ref 40–150)
ALT: 20 U/L (ref 0–55)
ANION GAP: 7 meq/L (ref 3–11)
AST: 19 U/L (ref 5–34)
BUN: 15.1 mg/dL (ref 7.0–26.0)
CALCIUM: 9.4 mg/dL (ref 8.4–10.4)
CHLORIDE: 107 meq/L (ref 98–109)
CO2: 26 mEq/L (ref 22–29)
Creatinine: 1.1 mg/dL (ref 0.6–1.1)
EGFR: 53 mL/min/{1.73_m2} — AB (ref >90)
Glucose: 120 mg/dl (ref 70–140)
POTASSIUM: 4.3 meq/L (ref 3.5–5.1)
Sodium: 140 mEq/L (ref 136–145)
Total Bilirubin: 0.52 mg/dL (ref 0.20–1.20)
Total Protein: 6.5 g/dL (ref 6.4–8.3)

## 2017-05-14 MED ORDER — SODIUM CHLORIDE 0.9 % IV SOLN
INTRAVENOUS | Status: AC
Start: 2017-05-14 — End: 2017-05-14
  Administered 2017-05-14: 10:00:00 via INTRAVENOUS

## 2017-05-14 MED ORDER — DEXAMETHASONE SODIUM PHOSPHATE 10 MG/ML IJ SOLN
INTRAMUSCULAR | Status: AC
  Filled 2017-05-14: qty 1

## 2017-05-14 MED ORDER — SODIUM CHLORIDE 0.9 % IV SOLN
Freq: Once | INTRAVENOUS | Status: AC
  Administered 2017-05-14: 12:00:00 via INTRAVENOUS

## 2017-05-14 MED ORDER — PEGFILGRASTIM 6 MG/0.6ML ~~LOC~~ PSKT
6.0000 mg | PREFILLED_SYRINGE | Freq: Once | SUBCUTANEOUS | Status: AC
  Administered 2017-05-14: 6 mg via SUBCUTANEOUS
  Filled 2017-05-14: qty 0.6

## 2017-05-14 MED ORDER — HEPARIN SOD (PORK) LOCK FLUSH 100 UNIT/ML IV SOLN
500.0000 [IU] | Freq: Once | INTRAVENOUS | Status: AC | PRN
  Administered 2017-05-14: 500 [IU]
  Filled 2017-05-14: qty 5

## 2017-05-14 MED ORDER — PALONOSETRON HCL INJECTION 0.25 MG/5ML
0.2500 mg | Freq: Once | INTRAVENOUS | Status: AC
  Administered 2017-05-14: 0.25 mg via INTRAVENOUS

## 2017-05-14 MED ORDER — ACETAMINOPHEN 325 MG PO TABS
650.0000 mg | ORAL_TABLET | Freq: Once | ORAL | Status: AC
  Administered 2017-05-14: 650 mg via ORAL

## 2017-05-14 MED ORDER — DOXORUBICIN HCL CHEMO IV INJECTION 2 MG/ML
40.0000 mg/m2 | Freq: Once | INTRAVENOUS | Status: AC
  Administered 2017-05-14: 88 mg via INTRAVENOUS
  Filled 2017-05-14: qty 44

## 2017-05-14 MED ORDER — PALONOSETRON HCL INJECTION 0.25 MG/5ML
INTRAVENOUS | Status: AC
  Filled 2017-05-14: qty 5

## 2017-05-14 MED ORDER — OXYCODONE-ACETAMINOPHEN 5-325 MG PO TABS: 1.0000 | ORAL_TABLET | Freq: Three times a day (TID) | ORAL | 0 refills | Status: DC | PRN

## 2017-05-14 MED ORDER — ACETAMINOPHEN 325 MG PO TABS
ORAL_TABLET | ORAL | Status: AC
  Filled 2017-05-14: qty 2

## 2017-05-14 MED ORDER — DIPHENHYDRAMINE HCL 25 MG PO CAPS
50.0000 mg | ORAL_CAPSULE | Freq: Once | ORAL | Status: AC
  Administered 2017-05-14: 50 mg via ORAL

## 2017-05-14 MED ORDER — DIPHENHYDRAMINE HCL 25 MG PO CAPS
ORAL_CAPSULE | ORAL | Status: AC
  Filled 2017-05-14: qty 2

## 2017-05-14 MED ORDER — SODIUM CHLORIDE 0.9 % IV SOLN
600.0000 mg/m2 | Freq: Once | INTRAVENOUS | Status: AC
  Administered 2017-05-14: 1320 mg via INTRAVENOUS
  Filled 2017-05-14: qty 66

## 2017-05-14 MED ORDER — DEXAMETHASONE SODIUM PHOSPHATE 10 MG/ML IJ SOLN
10.0000 mg | Freq: Once | INTRAMUSCULAR | Status: AC
  Administered 2017-05-14: 10 mg via INTRAVENOUS

## 2017-05-14 MED ORDER — VINCRISTINE SULFATE CHEMO INJECTION 1 MG/ML
2.0000 mg | Freq: Once | INTRAVENOUS | Status: AC
  Administered 2017-05-14: 2 mg via INTRAVENOUS
  Filled 2017-05-14: qty 2

## 2017-05-14 MED ORDER — SODIUM CHLORIDE 0.9% FLUSH
10.0000 mL | INTRAVENOUS | Status: DC | PRN
  Administered 2017-05-14: 10 mL
  Filled 2017-05-14: qty 10

## 2017-05-14 MED ORDER — SODIUM CHLORIDE 0.9% FLUSH
10.0000 mL | Freq: Once | INTRAVENOUS | Status: AC
  Administered 2017-05-14: 10 mL
  Filled 2017-05-14: qty 10

## 2017-05-14 MED ORDER — RITUXIMAB CHEMO INJECTION 500 MG/50ML
375.0000 mg/m2 | Freq: Once | INTRAVENOUS | Status: AC
  Administered 2017-05-14: 800 mg via INTRAVENOUS
  Filled 2017-05-14: qty 50

## 2017-05-14 NOTE — Patient Instructions (Signed)
[facility] Discharge Instructions for Patients Receiving Chemotherapy  Today you received the following chemotherapy agents Adriamycin/Vincristine/Cytoxan/Rituxan To help prevent nausea and vomiting after your treatment, we encourage you to take your nausea medication as prescribed.   If you develop nausea and vomiting that is not controlled by your nausea medication, call the clinic.   BELOW ARE SYMPTOMS THAT SHOULD BE REPORTED IMMEDIATELY:  *FEVER GREATER THAN 100.5 F  *CHILLS WITH OR WITHOUT FEVER  NAUSEA AND VOMITING THAT IS NOT CONTROLLED WITH YOUR NAUSEA MEDICATION  *UNUSUAL SHORTNESS OF BREATH  *UNUSUAL BRUISING OR BLEEDING  TENDERNESS IN MOUTH AND THROAT WITH OR WITHOUT PRESENCE OF ULCERS  *URINARY PROBLEMS  *BOWEL PROBLEMS  UNUSUAL RASH Items with * indicate a potential emergency and should be followed up as soon as possible.  Feel free to call the clinic you have any questions or concerns. The clinic phone number is [phone].  Please show the [place] at check-in to the Emergency Department and triage nurse.

## 2017-05-14 NOTE — Assessment & Plan Note (Signed)
02/03/2017: Large superior mediastinal mass contiguous with right hilar region encasing the trachea and right mainstem bronchus measuring 5.4 x 7.5 x 6 cm causing SVC obstruction, additional mediastinal lymph nodes subcarinal 1.6 cm and left hilar 1 cm; multiple small pulmonary nodules 4-6 mm   Bronchoscopy and biopsy 02/09/2017: Monoclonal B-cell population expressing CD10 consistent with non-Hodgkin's B-cell lymphoma germinal center origin Bone marrow biopsy 02/21/2017: No evidence of lymphoma PET/CT scan 25/89/4834: Hypermetabolic right paratracheal nodal mass ----------------------------------------------------------------------- Current treatment: Cycle 3 day 1 R-CHOP Chemotherapy toxicities: 1Severe profound diarrhea: In spite of Lomotil and Imodium. 2. Severe profound fatigue and headaches   3. Dehydration: IV fluids are being given. Electrolytes are being monitored. 4. Thrombocytopenia platelets of 100 5. Severe neutropenia ANC 300. We decreased the dosage of chemo  Return to clinic in 3 weeks for cycle 4

## 2017-05-14 NOTE — Progress Notes (Signed)
Patient Care Team: [name], MD as PCP - General (Endocrinology)  DIAGNOSIS:  Encounter Diagnosis  Name Primary?  . 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)     SUMMARY OF ONCOLOGIC HISTORY:   Non-Hodgkin lymphoma, unspecified, intrathoracic lymph nodes ([place])   02/03/2017 Imaging    Large superior mediastinal mass contiguous with right hilar region encasing the trachea and right mainstem bronchus measuring 5.4 x 7.5 x 6 cm causing SVC obstruction, additional mediastinal lymph nodes subcarinal 1.6 cm and left hilar 1 cm; multiple small pulmonary nodules 4-6 mm       02/11/2017 Initial Diagnosis    Flow cytometry of mediastinal mass revealed monoclonal population of B cells with expression of CD10 consistent with non-Hodgkin B-cell lymphoma germinal center operation; bone marrow biopsy negative for lymphoma      04/02/2017 -  Chemotherapy    R-CHOP X 6 cycles        CHIEF COMPLIANT: Cycle 3R CHOP  INTERVAL HISTORY: Kayla Harrison is a 71 year old with above-mentioned history of 5 mediastinal non-Hodgkin's lymphoma currently on R CHOP chemotherapy and today's cycle 3. Patient has had multiple side effects from the chemotherapy including diarrhea, nausea, dehydration, fatigue and cytopenias. These continue to be a problem. Complains of fogginess in the head. Fatigue that lasts for a week  REVIEW OF SYSTEMS:   Constitutional: Denies fevers, chills or abnormal weight loss Eyes: Denies blurriness of vision Ears, nose, mouth, throat, and face: Denies mucositis or sore throat Respiratory: Denies cough, dyspnea or wheezes Cardiovascular: Denies palpitation, chest discomfort Gastrointestinal:  Denies nausea, heartburn or change in bowel habits Skin: Denies abnormal skin rashes Lymphatics: Denies new lymphadenopathy or easy bruising Neurological: Cognitive difficulties Behavioral/Psych: Mood is stable, no new changes  Extremities: No  lower extremity edema All other systems were reviewed with the patient and are negative.  I have reviewed the past medical history, past surgical history, social history and family history with the patient and they are unchanged from previous note.  ALLERGIES:  is allergic to tikosyn [dofetilide]; xarelto [rivaroxaban]; epinephrine; ketamine; eliquis [apixaban]; and adhesive [tape].  MEDICATIONS:  Current Outpatient Prescriptions  Medication Sig Dispense Refill  . acetaminophen (TYLENOL 8 HOUR) 650 MG CR tablet Take 1 tablet (650 mg total) by mouth every 8 (eight) hours as needed for pain. (Patient not taking: Reported on 04/30/2017) 30 tablet 0  . allopurinol (ZYLOPRIM) 300 MG tablet Take 1 tablet (300 mg total) by mouth daily. 30 tablet 3  . amiodarone (PACERONE) 200 MG tablet Take 1 tablet (200 mg total) by mouth daily. 90 tablet 3  . apixaban (ELIQUIS) 5 MG TABS tablet Take 1 tablet (5 mg total) by mouth 2 (two) times daily. 180 tablet 1  . cycloSPORINE (RESTASIS) 0.05 % ophthalmic emulsion Place 1 drop into both eyes 2 (two) times daily.    Marland Kitchen dicyclomine (BENTYL) 20 MG tablet Take 1 tablet (20 mg total) by mouth every 6 (six) hours. As needed (Patient not taking: Reported on 04/30/2017) 60 tablet 1  . diphenhydramine-acetaminophen (TYLENOL PM) 25-500 MG TABS tablet Take 1 tablet by mouth at bedtime as needed.    . diphenoxylate-atropine (LOMOTIL) 2.5-0.025 MG tablet Take 2 tablets by mouth 4 (four) times daily as needed for diarrhea or loose stools. 30 tablet 0  . furosemide (LASIX) 40 MG tablet Take 40 mg by mouth daily as needed for fluid.     Marland Kitchen levothyroxine (SYNTHROID, LEVOTHROID) 175 MCG tablet Take 175 mcg by mouth daily  before breakfast.    . lidocaine-prilocaine (EMLA) cream Apply to affected area once 30 g 3  . Lifitegrast (XIIDRA) 5 % SOLN Apply 1 drop to eye 2 (two) times daily.    Marland Kitchen LORazepam (ATIVAN) 0.5 MG tablet Take 1 tablet (0.5 mg total) by mouth at bedtime. 30 tablet 0  .  Multiple Vitamin (MULTIVITAMIN) tablet Take 1 tablet by mouth daily.     . ondansetron (ZOFRAN) 8 MG tablet Take 1 tablet (8 mg total) by mouth 2 (two) times daily as needed for refractory nausea / vomiting. Start on day 3 after cyclophosphamide chemotherapy. (Patient not taking: Reported on 04/30/2017) 30 tablet 1  . Oxycodone HCl 10 MG TABS Take 1 tablet (10 mg total) by mouth every 4 (four) hours as needed. (Patient not taking: Reported on 04/30/2017) 20 tablet 0  . oxyCODONE-acetaminophen (PERCOCET/ROXICET) 5-325 MG tablet Take 1 tablet by mouth every 8 (eight) hours as needed for severe pain. 30 tablet 0  . predniSONE (DELTASONE) 20 MG tablet Take 3 tablets (60 mg total) by mouth daily. Take on days 1-5 of chemotherapy. 30 tablet 3  . prochlorperazine (COMPAZINE) 10 MG tablet Take 1 tablet (10 mg total) by mouth every 6 (six) hours as needed (Nausea or vomiting). (Patient not taking: Reported on 04/30/2017) 30 tablet 6  . sucralfate (CARAFATE) 1 g tablet Take 1 tablet (1 g total) by mouth 4 (four) times daily. (Patient not taking: Reported on 04/30/2017) 120 tablet 2  . traMADol (ULTRAM) 50 MG tablet Take 1-2 tablets (50-100 mg total) by mouth every 6 (six) hours as needed. (Patient not taking: Reported on 04/10/2017) 60 tablet 0   No current facility-administered medications for this visit.     PHYSICAL EXAMINATION: ECOG PERFORMANCE STATUS: 1 - Symptomatic but completely ambulatory  Vitals:   05/14/17 0854  BP: 126/73  Pulse: 81  Resp: 18  Temp: 97.7 F (36.5 C)  SpO2: 97%   Filed Weights   05/14/17 0854  Weight: 229 lb 3.2 oz (104 kg)    GENERAL:alert, no distress and comfortable SKIN: skin color, texture, turgor are normal, no rashes or significant lesions EYES: normal, Conjunctiva are pink and non-injected, sclera clear OROPHARYNX:no exudate, no erythema and lips, buccal mucosa, and tongue normal  NECK: supple, thyroid normal size, non-tender, without nodularity LYMPH:  no palpable  lymphadenopathy in the cervical, axillary or inguinal LUNGS: clear to auscultation and percussion with normal breathing effort HEART: regular rate & rhythm and no murmurs and no lower extremity edema ABDOMEN:abdomen soft, non-tender and normal bowel sounds MUSCULOSKELETAL:no cyanosis of digits and no clubbing  NEURO: alert & oriented x 3 with fluent speech, no focal motor/sensory deficits EXTREMITIES: No lower extremity edema  LABORATORY DATA:  I have reviewed the data as listed   Chemistry      Component Value Date/Time   NA 140 05/14/2017 0814   K 4.3 05/14/2017 0814   CL 106 02/08/2017 0233   CO2 26 05/14/2017 0814   BUN 15.1 05/14/2017 0814   CREATININE 1.1 05/14/2017 0814      Component Value Date/Time   CALCIUM 9.4 05/14/2017 0814   ALKPHOS 78 05/14/2017 0814   AST 19 05/14/2017 0814   ALT 20 05/14/2017 0814   BILITOT 0.52 05/14/2017 0814       Lab Results  Component Value Date   WBC 7.2 05/14/2017   HGB 10.8 (L) 05/14/2017   HCT 32.6 (L) 05/14/2017   MCV 93.3 05/14/2017   PLT 232 05/14/2017  NEUTROABS 5.5 05/14/2017    ASSESSMENT & PLAN:  Non-Hodgkin lymphoma, unspecified, intrathoracic lymph nodes ([place]) 02/03/2017: Large superior mediastinal mass contiguous with right hilar region encasing the trachea and right mainstem bronchus measuring 5.4 x 7.5 x 6 cm causing SVC obstruction, additional mediastinal lymph nodes subcarinal 1.6 cm and left hilar 1 cm; multiple small pulmonary nodules 4-6 mm   Bronchoscopy and biopsy 02/09/2017: Monoclonal B-cell population expressing CD10 consistent with non-Hodgkin's B-cell lymphoma germinal center origin Bone marrow biopsy 02/21/2017: No evidence of lymphoma PET/CT scan 37/90/2409: Hypermetabolic right paratracheal nodal mass ----------------------------------------------------------------------- Current treatment: Cycle 3 day 1 R-CHOP Chemotherapy toxicities: 1Severe profound diarrhea: In spite of Lomotil and  Imodium. 2. Severe profound fatigue and headaches   3. Dehydration: IV fluids are being given. Electrolytes are being monitored. 4. Thrombocytopenia : Today her platelets are 232 5. Severe neutropenia ANC 300. We decreased the dosage of chemo, today her ANC is 5.5 6. Cognitive difficulties: Being monitored  Plan to get a CT chest before the next cycle Return to clinic in 3 weeks for cycle 4   I spent 25 minutes talking to the patient of which more than half was spent in counseling and coordination of care.  Orders Placed This Encounter  Procedures  . CT Chest W Contrast    Standing Status:   Future    Standing Expiration Date:   05/14/2018    Order Specific Question:   If indicated for the ordered procedure, I authorize the administration of contrast media per Radiology protocol    Answer:   Yes    Order Specific Question:   Preferred imaging location?    Answer:   [facility]    Order Specific Question:   Radiology Contrast Protocol - do NOT remove file path    Answer:   \\[email or web address]   The patient has a good understanding of the overall plan. she agrees with it. she will call with any problems that may develop before the next visit here.   Rulon Eisenmenger, MD 05/14/17

## 2017-05-14 NOTE — Addendum Note (Signed)
Addended by: Paulla Dolly on: 05/14/2017 09:51 AM   Modules accepted: Orders

## 2017-05-15 NOTE — Addendum Note (Signed)
Addendum  created 05/15/17 1445 by Roberts Gaudy, MD   Sign clinical note

## 2017-05-21 ENCOUNTER — Telehealth: Payer: Self-pay

## 2017-05-21 NOTE — Telephone Encounter (Signed)
Called and spoke with pt to confirm her Ct chest schedule next week. Pt verbally confirm time/date of appt. No further questions at this time.

## 2017-05-22 ENCOUNTER — Other Ambulatory Visit: Payer: Self-pay

## 2017-05-22 ENCOUNTER — Encounter (HOSPITAL_COMMUNITY): Payer: Self-pay | Admitting: Nurse Practitioner

## 2017-05-22 ENCOUNTER — Ambulatory Visit (HOSPITAL_COMMUNITY)
Admission: RE | Admit: 2017-05-22 | Discharge: 2017-05-22 | Disposition: A | Discharge status: Home / Self Care | Payer: Medicare Other | Source: Ambulatory Visit | Attending: Nurse Practitioner | Admitting: Nurse Practitioner

## 2017-05-22 VITALS — BP 94/56 | HR 87 | Ht 66.0 in | Wt 229.0 lb

## 2017-05-22 DIAGNOSIS — Z950 Presence of cardiac pacemaker: Secondary | ICD-10-CM | POA: Insufficient documentation

## 2017-05-22 DIAGNOSIS — Z8673 Personal history of transient ischemic attack (TIA), and cerebral infarction without residual deficits: Secondary | ICD-10-CM | POA: Diagnosis not present

## 2017-05-22 DIAGNOSIS — Z79899 Other long term (current) drug therapy: Secondary | ICD-10-CM | POA: Insufficient documentation

## 2017-05-22 DIAGNOSIS — E890 Postprocedural hypothyroidism: Secondary | ICD-10-CM | POA: Insufficient documentation

## 2017-05-22 DIAGNOSIS — I4891 Unspecified atrial fibrillation: Secondary | ICD-10-CM | POA: Diagnosis not present

## 2017-05-22 DIAGNOSIS — I480 Paroxysmal atrial fibrillation: Secondary | ICD-10-CM

## 2017-05-22 DIAGNOSIS — Z923 Personal history of irradiation: Secondary | ICD-10-CM | POA: Diagnosis not present

## 2017-05-22 DIAGNOSIS — E785 Hyperlipidemia, unspecified: Secondary | ICD-10-CM | POA: Diagnosis not present

## 2017-05-22 DIAGNOSIS — Z87442 Personal history of urinary calculi: Secondary | ICD-10-CM | POA: Diagnosis not present

## 2017-05-22 DIAGNOSIS — M1710 Unilateral primary osteoarthritis, unspecified knee: Secondary | ICD-10-CM | POA: Insufficient documentation

## 2017-05-22 DIAGNOSIS — C50919 Malignant neoplasm of unspecified site of unspecified female breast: Secondary | ICD-10-CM | POA: Insufficient documentation

## 2017-05-22 DIAGNOSIS — Z90710 Acquired absence of both cervix and uterus: Secondary | ICD-10-CM | POA: Insufficient documentation

## 2017-05-22 DIAGNOSIS — I951 Orthostatic hypotension: Secondary | ICD-10-CM | POA: Insufficient documentation

## 2017-05-22 DIAGNOSIS — Z833 Family history of diabetes mellitus: Secondary | ICD-10-CM | POA: Insufficient documentation

## 2017-05-22 DIAGNOSIS — Z7901 Long term (current) use of anticoagulants: Secondary | ICD-10-CM | POA: Insufficient documentation

## 2017-05-22 DIAGNOSIS — M25511 Pain in right shoulder: Secondary | ICD-10-CM | POA: Diagnosis not present

## 2017-05-22 DIAGNOSIS — Z888 Allergy status to other drugs, medicaments and biological substances status: Secondary | ICD-10-CM | POA: Diagnosis not present

## 2017-05-22 DIAGNOSIS — Z85850 Personal history of malignant neoplasm of thyroid: Secondary | ICD-10-CM | POA: Diagnosis not present

## 2017-05-22 DIAGNOSIS — C3430 Malignant neoplasm of lower lobe, unspecified bronchus or lung: Secondary | ICD-10-CM | POA: Diagnosis not present

## 2017-05-22 DIAGNOSIS — Z8249 Family history of ischemic heart disease and other diseases of the circulatory system: Secondary | ICD-10-CM | POA: Insufficient documentation

## 2017-05-22 DIAGNOSIS — Z800 Family history of malignant neoplasm of digestive organs: Secondary | ICD-10-CM | POA: Diagnosis not present

## 2017-05-22 DIAGNOSIS — Z9884 Bariatric surgery status: Secondary | ICD-10-CM | POA: Insufficient documentation

## 2017-05-22 DIAGNOSIS — Z6836 Body mass index (BMI) 36.0-36.9, adult: Secondary | ICD-10-CM | POA: Diagnosis not present

## 2017-05-22 NOTE — Progress Notes (Signed)
Primary Care Physician: [name], MD Referring Physician: Dr. [name] is a 71 y.o. female with a h/o large superior mediastinal mass contiguous with right hilar region encasing the trachea and right mainstem bronchus measuring 5.4 x 7.5 x 6 cm causing SVC obstruction,  Undergoing chemotherapy on R CHOP chemotherapy with multiple side effects,including diarrhea, nausea, dehydration, fatigue and cytopenias. These continue to be a problem. Complains of fogginess in the head, fatigue that lasts for a week after a treatment. She also had more afib burden with start of chemo and Dr. Caryl Comes has her on 400 mg amiodarone daily now until finished with chemo and hopes to stop drug in November.   She has also had issues with dizziness with change of position and when standing for a while and Dr. Caryl Comes wanted orthostatic's done today.BP lying 126/62, HR 76, sitting 106/72, HR 81, standing 94/56 HR 87. She is staying hydrated. She is not on any meds that could be adjusted to help with this. Has lasix on her med list, but is not using. She is pending chest CT.   Today, she denies symptoms of palpitations, chest pain, shortness of breath, orthopnea, PND, lower extremity edema,  presyncope, syncope, or neurologic sequela. Dizziness with standing. The patient is tolerating medications without difficulties and is otherwise without complaint today.   Past Medical History:  Diagnosis Date  . Abnormally small mouth    per pt "I have a small mouth"  . Arthritis    osteoarthritis - knees   . Atrial fibrillation [facility])    ablation- 2x's-- 1st time- [facility], 2nd event at [facility] in 2008. Convergent ablation at [facility] 6/14  . Breast cancer ([place])    Dr Margot Chimes, total thyroidectomy- 1999- for cancer  . Colon polyp    Dr Earlean Shawl  . Complication of anesthesia    Ketamine produces LSD reaction, bright colored nightmarish experience   . Dyslipidemia   . Dysrhythmia    a fib with internal/external  ablation, currently NSR  . H/O pleural effusion    s/p thoracentesis w 3261m withdrawn  . Hepatitis    [place]   . History of dysphagia    due to radiation therapy  . History of kidney stones   . Hx of thyroid cancer    Dr SForde Dandy . Hyperlipidemia   . Hypothyroidism   . Lung [place]   radiation RX completed 03/04/17; will start chemo 6/27  . Morbid obesity ([place]    Status post lap band surgery  . Normal cardiac stress test    2008- cardiac stress/echo  . Pacemaker-Medtronic   . [facility] had to be [facility]   . Renal calculi   . Right shoulder pain    needs shoulder replacement  . Sinus node dysfunction ([place]    Complicating convergent ablation 6/14  . [place]    [place]  . Wears glasses    readers   Past Surgical History:  Procedure Laterality Date  . ABDOMINAL HYSTERECTOMY  1983  . afib ablation     X 2; [facility] & Dr KCaryl Comes . APPENDECTOMY    . BONE MARROW BIOPSY  02/21/2017  . BREAST LUMPECTOMY  2010  . bso  1998  . CARDIOVERSION  10/09/2012   Procedure: CARDIOVERSION;  Surgeon: JMinus Breeding MD;  [place]  Service: Cardiovascular;  Laterality: N/A;  .  CARDIOVERSION  10/09/2012   Procedure: CARDIOVERSION;  Surgeon: Minus Breeding, MD;  Location[facility] ENDOSCOPY;  Service: Cardiovascular;  Laterality: N/A;  [name] gave the ok to add pt to the add on , but we must check to find out if the can add pt on at [phone] ( [phone])  . CARDIOVERSION N/A 11/20/2012   Procedure: CARDIOVERSION;  Surgeon: Fay Records, MD;  Location: [facility] ENDOSCOPY;  Service: Cardiovascular;  Laterality: N/A;  . CHOLECYSTECTOMY    . COLONOSCOPY W/ POLYPECTOMY     Dr Earlean Shawl  . convergent    . CYSTOSCOPY N/A 02/06/2015   Procedure: CYSTOSCOPY;  Surgeon: Kathie Rhodes, MD;  Location: [facility] ORS;  Service: Urology;  Laterality: N/A;  . CYSTOSCOPY WITH RETROGRADE PYELOGRAM, URETEROSCOPY AND  STENT PLACEMENT Right 02/06/2015   Procedure: RETROGRADE PYELOGRAM, RIGHT URETEROSCOPY STENT PLACEMENT;  Surgeon: Kathie Rhodes, MD;  Location: [facility];  Service: Urology;  Laterality: Right;  . CYSTOSCOPY WITH RETROGRADE PYELOGRAM, URETEROSCOPY AND STENT PLACEMENT Right 03/07/2017   Procedure: CYSTOSCOPY WITH RIGHT RETROGRADE PYELOGRAM,RIGHT  URETEROSCOPYLASER LITHOTRIPSY  AND STENT PLACEMENT AND STONE BASKETRY;  Surgeon: Kathie Rhodes, MD;  Location: [place];  Service: Urology;  Laterality: Right;  . EYE SURGERY     cataract surgery  . HOLMIUM LASER APPLICATION N/A 12/06/4006   Procedure: HOLMIUM LASER APPLICATION;  Surgeon: Kathie Rhodes, MD;  Location: [facility];  Service: Urology;  Laterality: N/A;  . HOLMIUM LASER APPLICATION Right 6/76/1950   Procedure: HOLMIUM LASER APPLICATION;  Surgeon: Kathie Rhodes, MD;  Location: [facility];  Service: Urology;  Laterality: Right;  . IR FLUORO GUIDE PORT INSERTION RIGHT  02/24/2017  . IR PATIENT EVAL TECH 0-60 MINS  03/11/2017  . IR US GUIDE VASC ACCESS RIGHT  02/24/2017  . JOINT REPLACEMENT  04/13/12   Right Knee replacement  . KNEE ARTHROSCOPY     bilateral  . LAPAROSCOPIC GASTRIC BANDING  07/10/2010  . LAPAROTOMY     for ruptured ovary and ovarian artery   . PACEMAKER INSERTION  03/10/13   [facility]  . POCKET REVISION N/A 12/08/2013   Procedure: POCKET REVISION;  Surgeon: Deboraha Sprang, MD;  Location: [facility] CATH LAB;  Service: Cardiovascular;  Laterality: N/A;  . THYROIDECTOMY  1998   Dr Margot Chimes  . TONSILLECTOMY    . TOTAL KNEE ARTHROPLASTY  04/13/2012   Procedure: TOTAL KNEE ARTHROPLASTY;  Surgeon: Rudean Haskell, MD;  Location: [place];  Service: Orthopedics;  Laterality: Right;  . Transabdominal ablation  03/02/13   [facility]  . VIDEO BRONCHOSCOPY WITH ENDOBRONCHIAL ULTRASOUND N/A 02/07/2017   Procedure: VIDEO BRONCHOSCOPY WITH ENDOBRONCHIAL ULTRASOUND;  Surgeon: Marshell Garfinkel, MD;  Location: [place];  Service: Pulmonary;   Laterality: N/A;    Current Outpatient Prescriptions  Medication Sig Dispense Refill  . acetaminophen (TYLENOL 8 HOUR) 650 MG CR tablet Take 1 tablet (650 mg total) by mouth every 8 (eight) hours as needed for pain. 30 tablet 0  . allopurinol (ZYLOPRIM) 300 MG tablet Take 1 tablet (300 mg total) by mouth daily. 30 tablet 3  . amiodarone (PACERONE) 200 MG tablet Take 1 tablet (200 mg total) by mouth daily. 90 tablet 3  . apixaban (ELIQUIS) 5 MG TABS tablet Take 1 tablet (5 mg total) by mouth 2 (two) times daily. 180 tablet 1  . cycloSPORINE (RESTASIS) 0.05 % ophthalmic emulsion Place 1 drop into both eyes 2 (two) times daily.    . diphenhydramine-acetaminophen (TYLENOL PM) 25-500 MG TABS tablet Take 1 tablet  by mouth at bedtime as needed.    . diphenoxylate-atropine (LOMOTIL) 2.5-0.025 MG tablet Take 2 tablets by mouth 4 (four) times daily as needed for diarrhea or loose stools. 30 tablet 0  . levothyroxine (SYNTHROID, LEVOTHROID) 175 MCG tablet Take 175 mcg by mouth daily before breakfast.    . lidocaine-prilocaine (EMLA) cream Apply to affected area once 30 g 3  . Lifitegrast (XIIDRA) 5 % SOLN Apply 1 drop to eye 2 (two) times daily.    . Multiple Vitamin (MULTIVITAMIN) tablet Take 1 tablet by mouth daily.     . predniSONE (DELTASONE) 20 MG tablet Take 3 tablets (60 mg total) by mouth daily. Take on days 1-5 of chemotherapy. 30 tablet 3  . dicyclomine (BENTYL) 20 MG tablet Take 1 tablet (20 mg total) by mouth every 6 (six) hours. As needed (Patient not taking: Reported on 04/30/2017) 60 tablet 1  . furosemide (LASIX) 40 MG tablet Take 40 mg by mouth daily as needed for fluid.     Marland Kitchen LORazepam (ATIVAN) 0.5 MG tablet Take 1 tablet (0.5 mg total) by mouth at bedtime. (Patient not taking: Reported on 05/22/2017) 30 tablet 0  . ondansetron (ZOFRAN) 8 MG tablet Take 1 tablet (8 mg total) by mouth 2 (two) times daily as needed for refractory nausea / vomiting. Start on day 3 after cyclophosphamide  chemotherapy. (Patient not taking: Reported on 05/22/2017) 30 tablet 1  . Oxycodone HCl 10 MG TABS Take 1 tablet (10 mg total) by mouth every 4 (four) hours as needed. (Patient not taking: Reported on 04/30/2017) 20 tablet 0  . sucralfate (CARAFATE) 1 g tablet Take 1 tablet (1 g total) by mouth 4 (four) times daily. (Patient not taking: Reported on 05/22/2017) 120 tablet 2   No current facility-administered medications for this encounter.     Allergies  Allergen Reactions  . Tikosyn [Dofetilide] Other (See Comments)    Prolonged QT interval Prolonged QT interval  . Xarelto [Rivaroxaban] Other (See Comments)    Nose Bleed X 6 hrs ; packing in ER  . Epinephrine Other (See Comments) and Rash    Dental form only (liquid). Patient stated she will become "out of it" she can hear you but cannot respond in normal fashion.  Marland Kitchen Ketamine     Hallucinations  . Adhesive [Tape] Rash    Paper tape as well    Social History   Social History  . Marital status: Single    Spouse name: N/A  . Number of children: 0  . Years of education: N/A   Occupational History  . [place] [employer] Retired   Social History Main Topics  . Smoking status: Never Smoker  . Smokeless tobacco: Never Used  . Alcohol use No  . Drug use: No  . Sexual activity: Not on file   Other Topics Concern  . Not on file   Social History Narrative   SINGLE    NO CHILDREN   NEVER SMOKED   EXERCISE 3 X WK          Family History  Problem Relation Age of Onset  . Heart disease Father 39       MI '@autopsy'$   . Colon cancer Father        COLON  . Heart attack Father   . Diabetes Sister   . Diabetes Brother   . Diabetes Paternal Aunt   . Diabetes Paternal Grandmother     ROS- All systems are reviewed and negative except  as per the HPI above  Physical Exam: Vitals:   05/22/17 1425 05/22/17 1439 05/22/17 1440  BP: 126/62 106/72 (!) 94/56  Pulse: 77 81 87  SpO2: 94%    Weight: 229 lb (103.9  kg)    Height: '5\' 6"'$  (1.676 m)     Wt Readings from Last 3 Encounters:  05/22/17 229 lb (103.9 kg)  05/14/17 229 lb 3.2 oz (104 kg)  04/30/17 229 lb 8 oz (104.1 kg)    Labs: Lab Results  Component Value Date   NA 140 05/14/2017   K 4.3 05/14/2017   CL 106 02/08/2017   CO2 26 05/14/2017   GLUCOSE 120 05/14/2017   BUN 15.1 05/14/2017   CREATININE 1.1 05/14/2017   CALCIUM 9.4 05/14/2017   PHOS 3.9 04/19/2013   MG 1.8 04/08/2017   Lab Results  Component Value Date   INR 2.2 04/28/2017   Lab Results  Component Value Date   CHOL 200 07/26/2013   HDL 51.80 07/26/2013   LDLCALC 116 (H) 07/26/2013   TRIG 162.0 (H) 07/26/2013     GEN- The patient is well appearing, alert and oriented x 3 today.   Head- normocephalic, atraumatic Eyes-  Sclera clear, conjunctiva pink Ears- hearing intact Oropharynx- clear Neck- supple, no JVP Lymph- no cervical lymphadenopathy Lungs- Clear to ausculation bilaterally, normal work of breathing Heart- Regular rate and rhythm, no murmurs, rubs or gallops, PMI not laterally displaced GI- soft, NT, ND, + BS Extremities- no clubbing, cyanosis, or edema MS- no significant deformity or atrophy Skin- no rash or lesion Psych- euthymic mood, full affect Neuro- strength and sensation are intact  EKG- NSR, normal EKG, 77 bpm, pr int 190 ms, qrs int 88 ms, qtc 488 ms Epic records reviewed    Assessment and Plan: 1. Afib Staying in SR Continue with amiodarone 400 mg daily, per Dr. Caryl Comes he hopes to stop drug in November when she is finished with Chemo Tsh/liver checked first of August and WNL  2. Orthostatic hypotension/dizziness on standing See HPI for BP/HR reading She does have a drop in BP standing Not on any meds to adjust that would be contributing Encouraged hydration Knee high support hose may be helpful to keep blood from pooling in legs with standing  Will make Dr. Caryl Comes aware of orthostatics  F/u with Dr. Caryl Comes as scheduled in 4  weeks   Kayla Harrison. Kayla Harrison, [facility] [address] [place], [place] [address] [phone]

## 2017-05-24 IMAGING — CR DG WRIST 2V*R*
2 series · 2 of 2 positions shown · non-contrast
Comparison: 06/30/2015.

CLINICAL DATA: 69-year-old presenting with a cat bite to the right
hand and severe swelling and erythema involving the hand and wrist.
Initial encounter.

EXAM:
RIGHT WRIST - 2 VIEW

[PA]
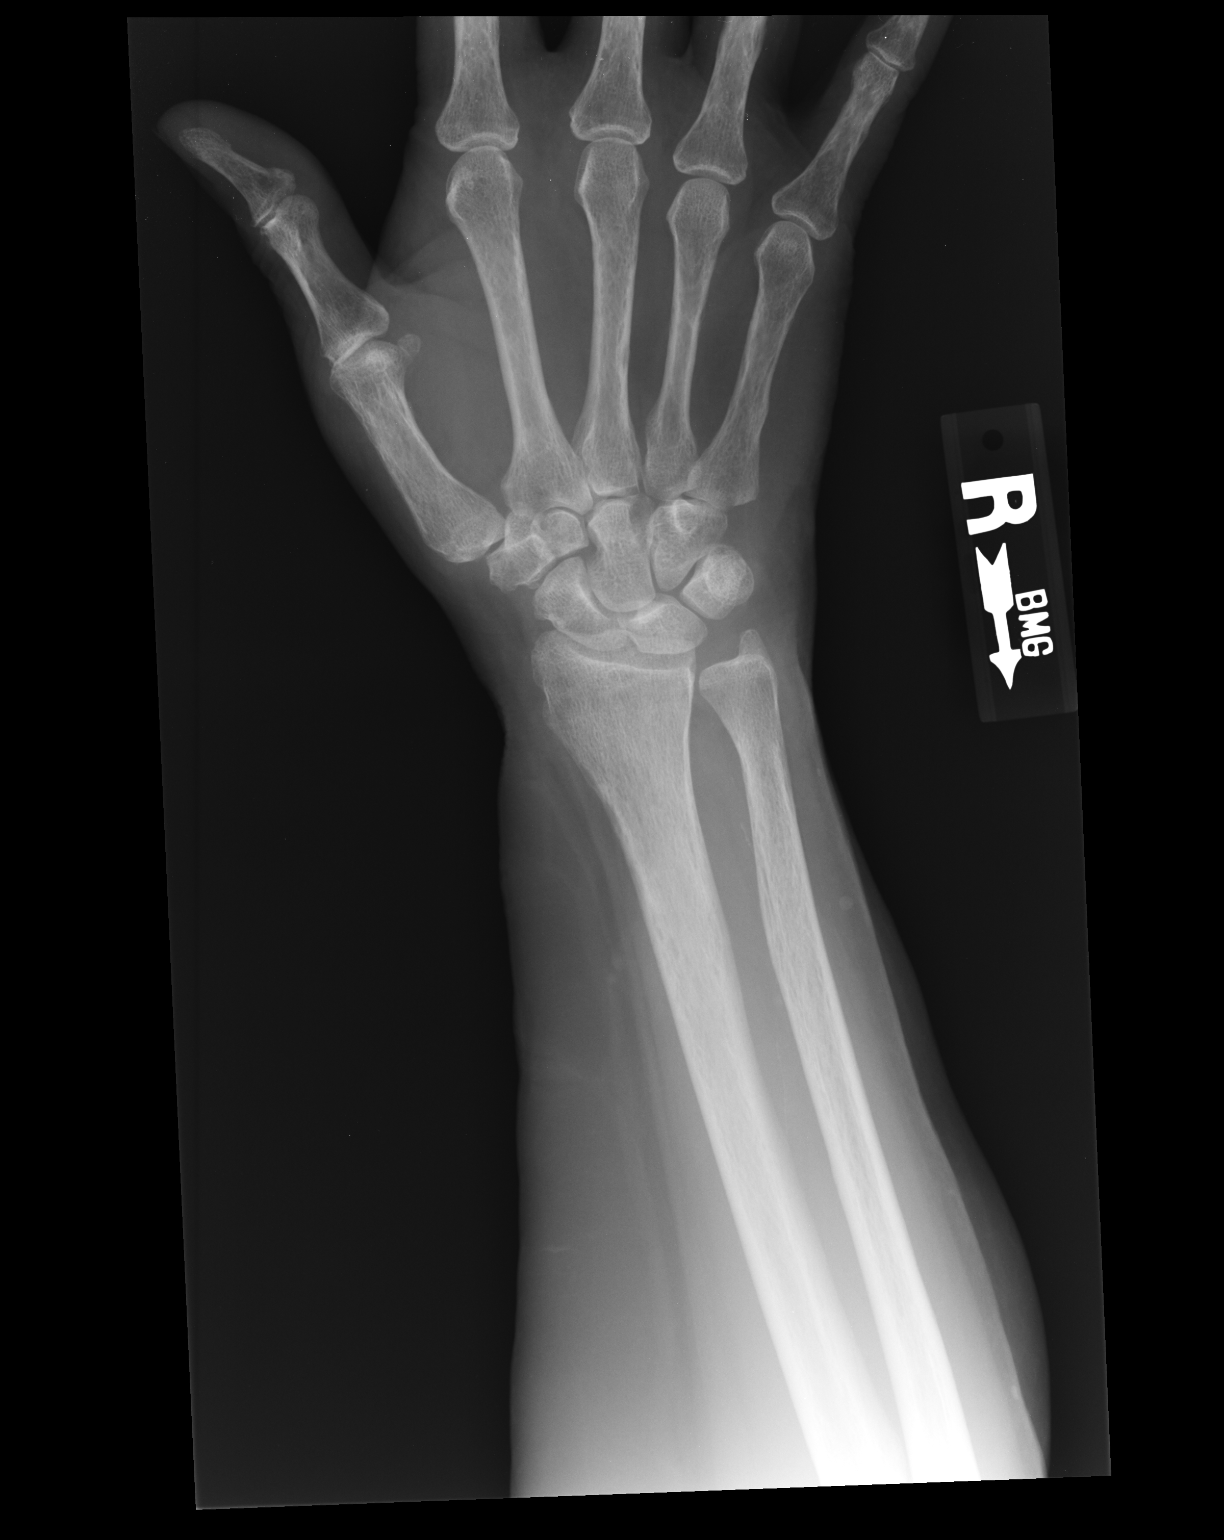

[lateral]
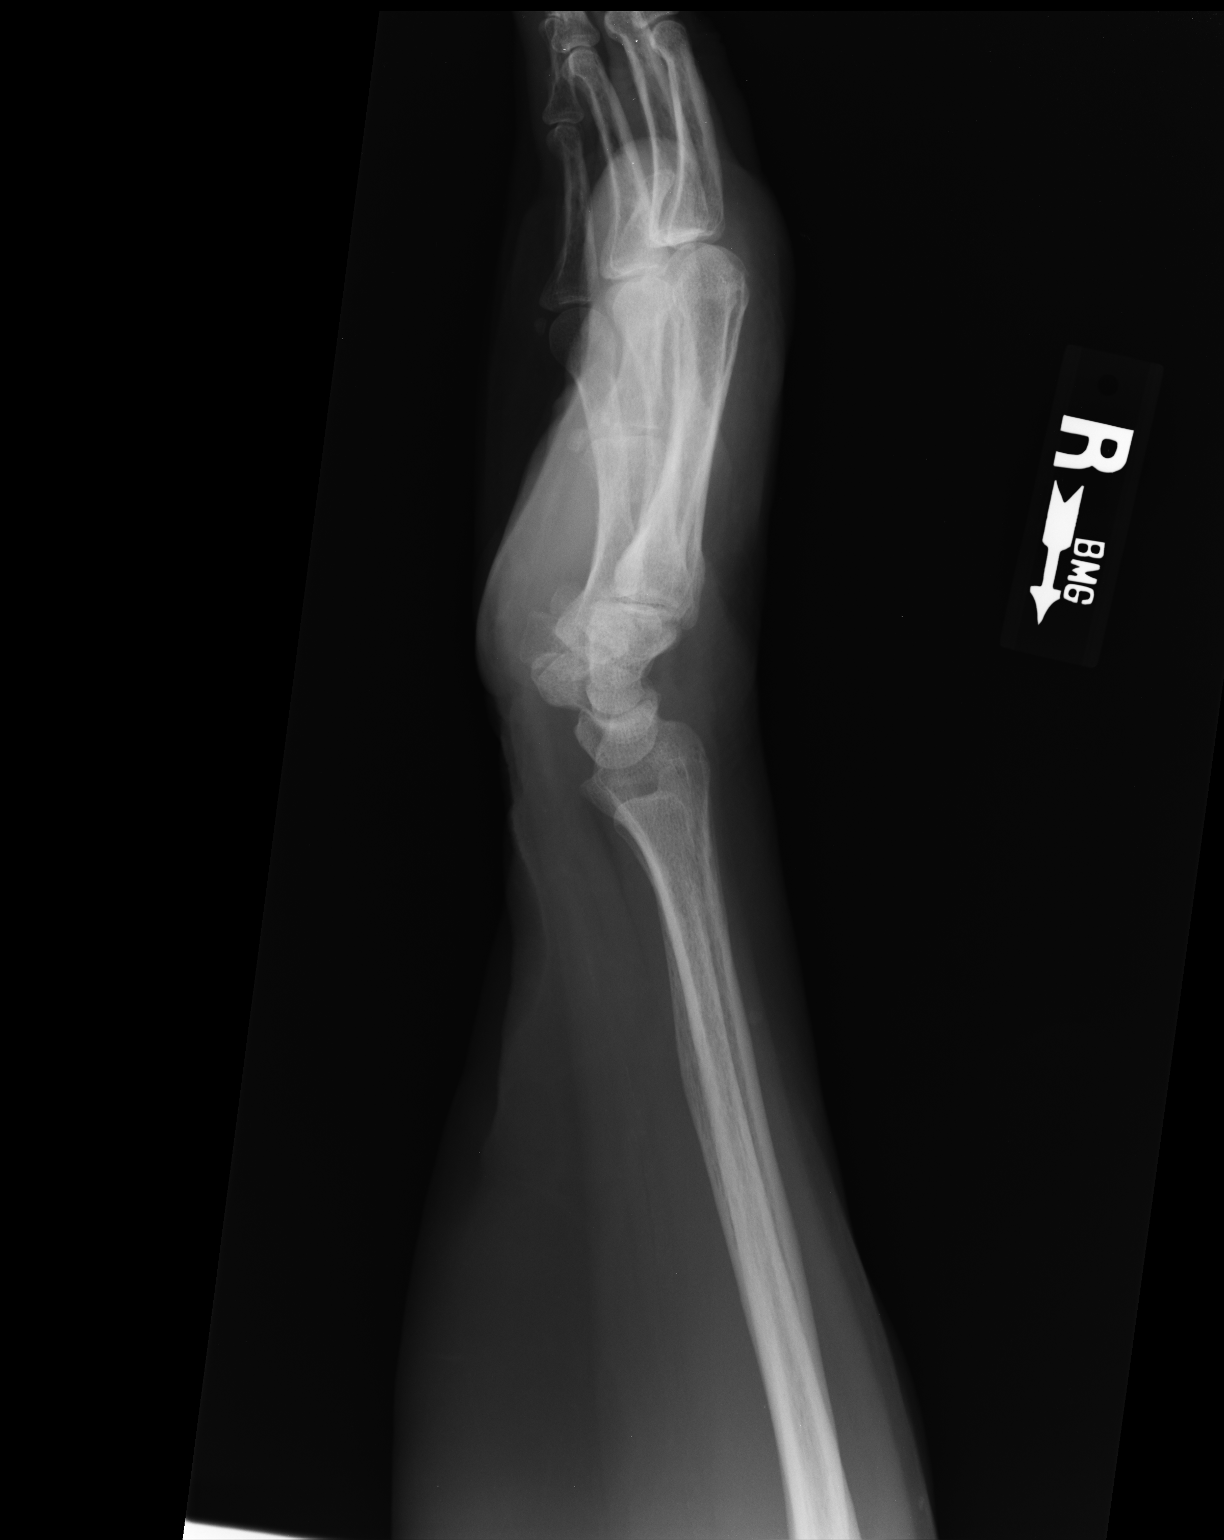

[2 of 2 positions shown; findings below may reference images not displayed]

FINDINGS: No evidence of acute or subacute fracture or dislocation. Mild
radiocarpal joint space narrowing, unchanged. Remaining joint spaces
well preserved. No intrinsic osseous abnormality. No visible joint
effusion.
IMPRESSION: No acute osseous abnormality.

## 2017-05-24 IMAGING — CR DG HAND 2V*R*
2 series · 2 of 2 positions shown · non-contrast
Comparison: None.

CLINICAL DATA: Cat bite to left hand.  Left hand pain and swelling.

EXAM:
RIGHT HAND - 2 VIEW

[PA]
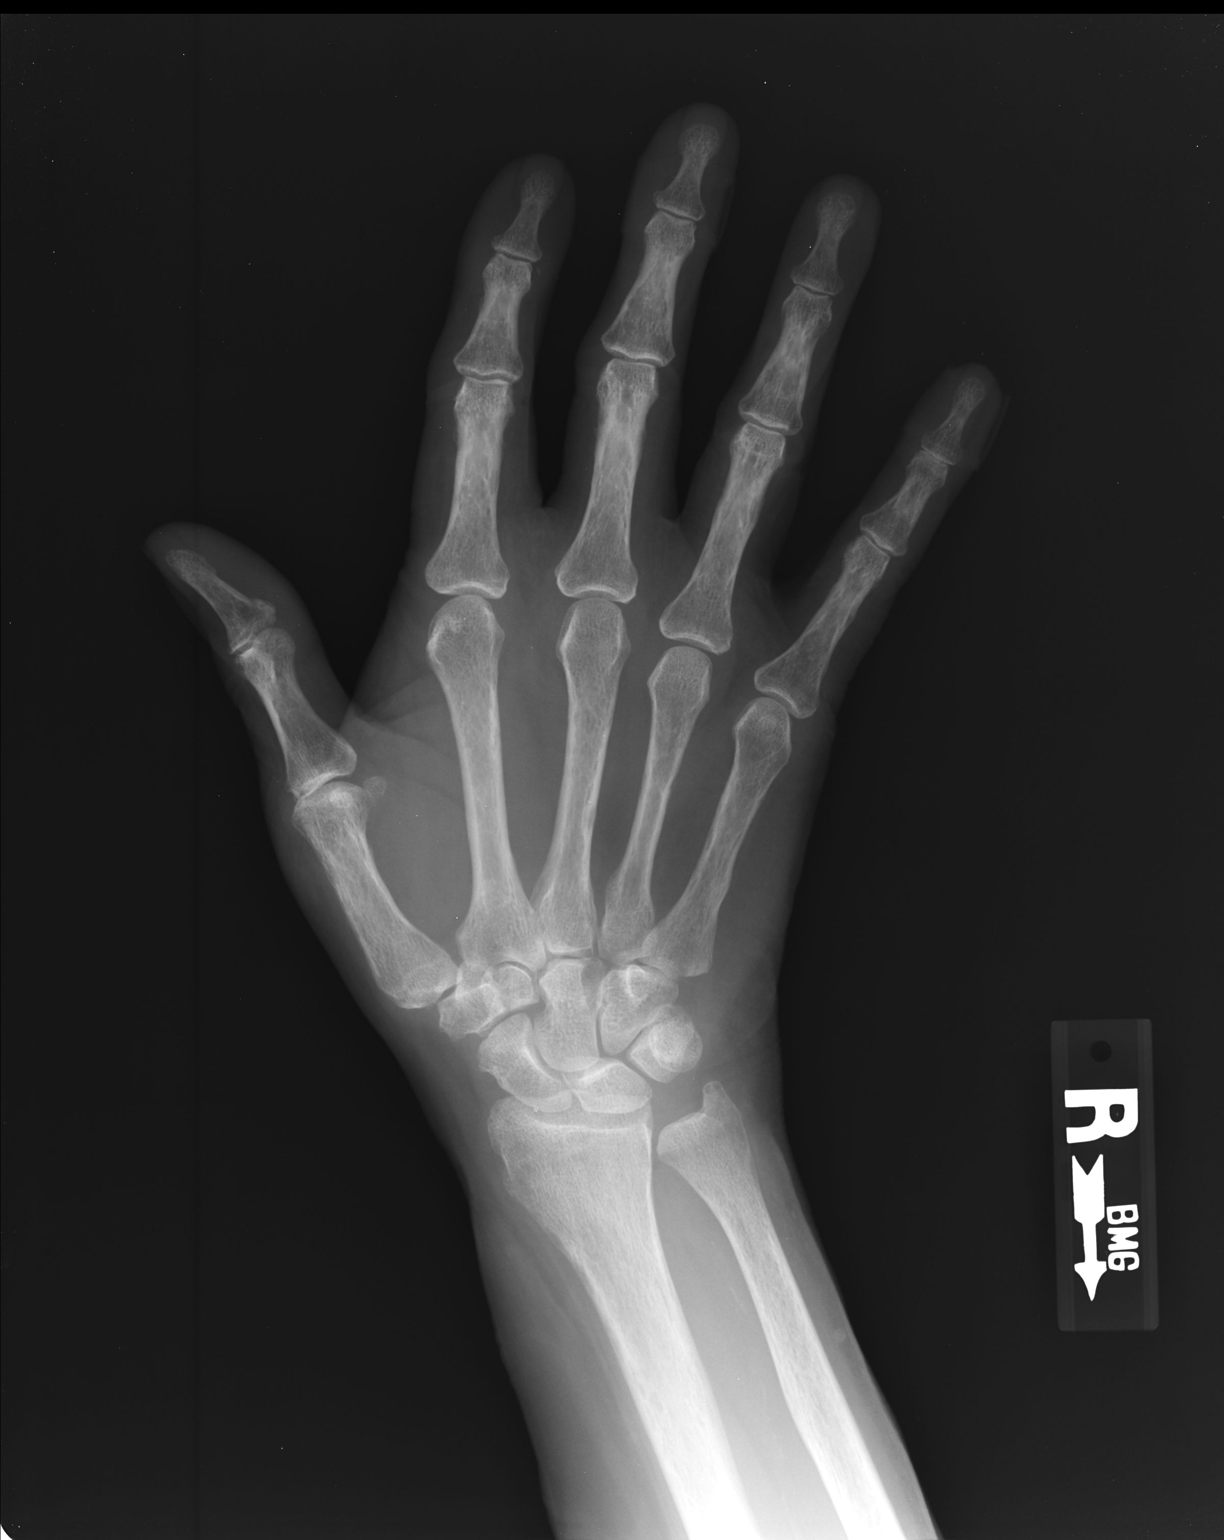

[lateral]
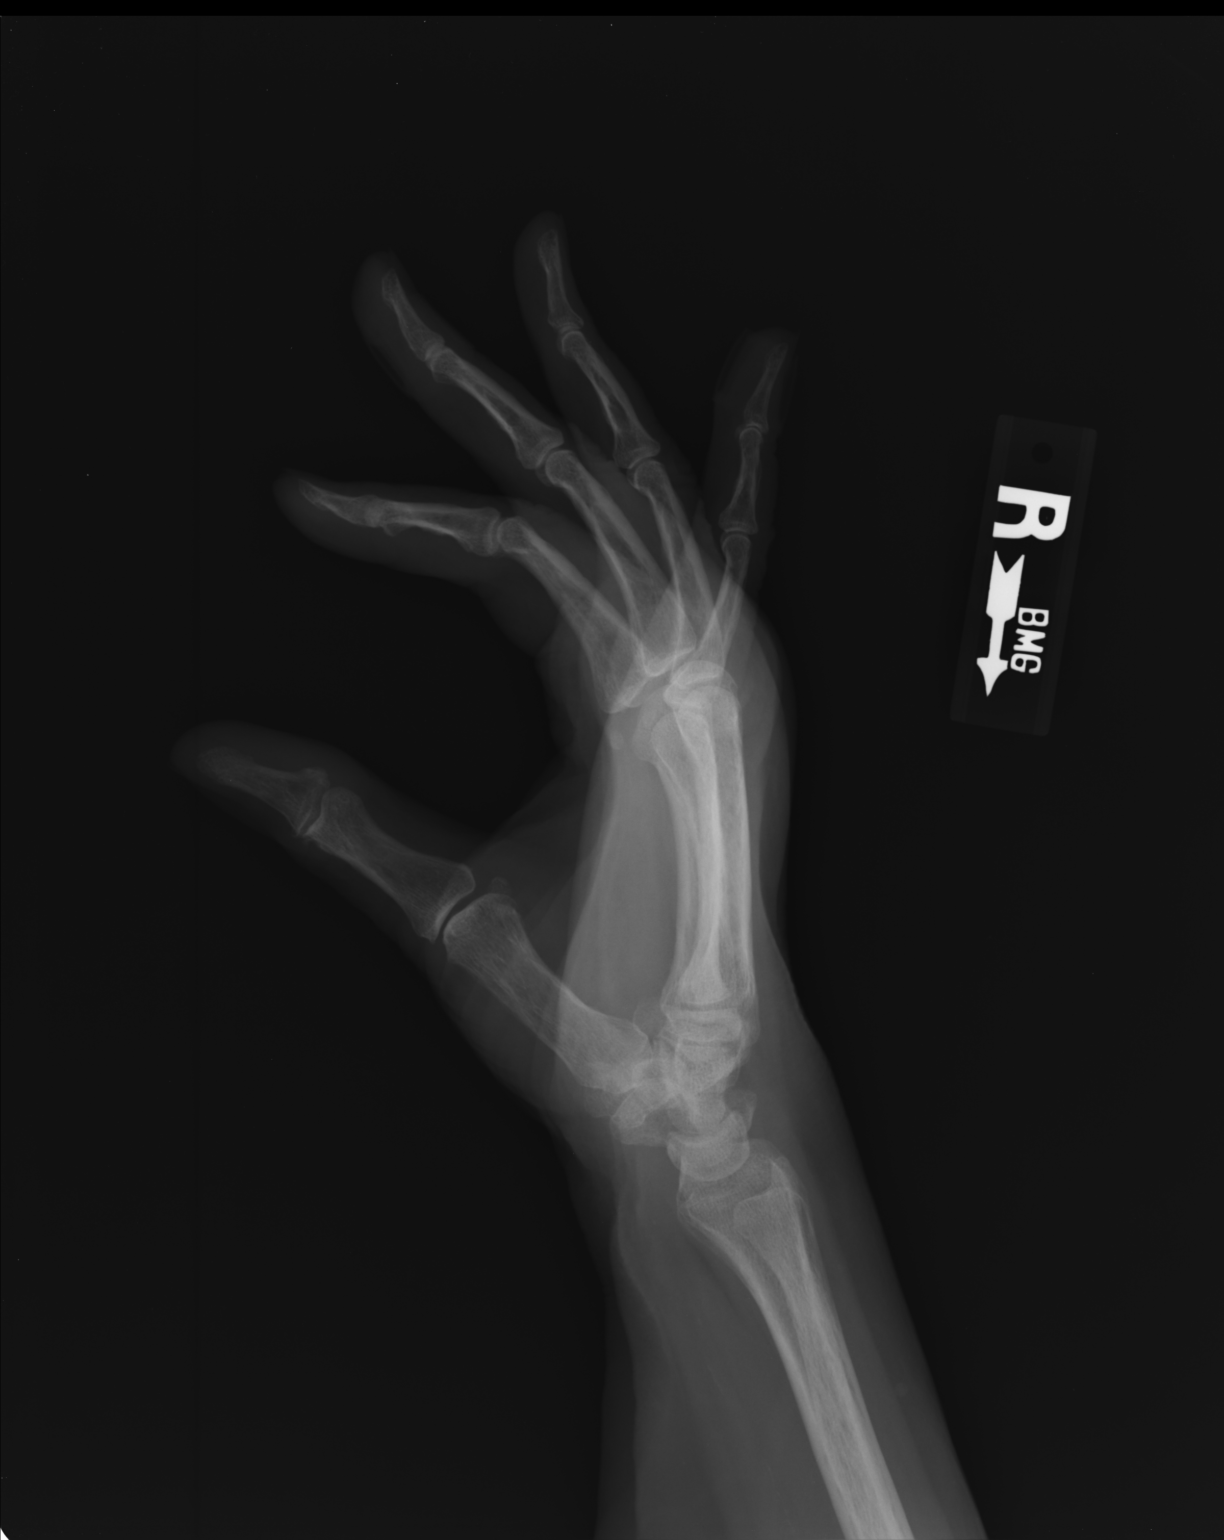

[2 of 2 positions shown; findings below may reference images not displayed]

FINDINGS: There is no evidence of fracture or dislocation. There is no
evidence of arthropathy or other focal bone abnormality. Soft
tissues are unremarkable. No evidence of radiopaque foreign body or
soft tissue gas.
IMPRESSION: Negative.

## 2017-05-25 ENCOUNTER — Other Ambulatory Visit: Payer: Self-pay | Admitting: Hematology and Oncology

## 2017-05-27 ENCOUNTER — Ambulatory Visit (HOSPITAL_COMMUNITY)
Admission: RE | Admit: 2017-05-27 | Discharge: 2017-05-27 | Disposition: A | Discharge status: Home / Self Care | Payer: Medicare Other | Source: Ambulatory Visit | Attending: Hematology and Oncology | Admitting: Hematology and Oncology

## 2017-05-27 ENCOUNTER — Ambulatory Visit (HOSPITAL_COMMUNITY): Payer: Medicare Other | Admitting: Nurse Practitioner

## 2017-05-27 ENCOUNTER — Telehealth: Payer: Self-pay | Admitting: Hematology and Oncology

## 2017-05-27 ENCOUNTER — Encounter (HOSPITAL_COMMUNITY): Payer: Self-pay

## 2017-05-27 DIAGNOSIS — I700 Atherosclerosis of aorta: Secondary | ICD-10-CM | POA: Diagnosis not present

## 2017-05-27 DIAGNOSIS — I2510 Atherosclerotic heart disease of native coronary artery without angina pectoris: Secondary | ICD-10-CM | POA: Diagnosis not present

## 2017-05-27 DIAGNOSIS — R918 Other nonspecific abnormal finding of lung field: Secondary | ICD-10-CM | POA: Insufficient documentation

## 2017-05-27 DIAGNOSIS — C8522 Mediastinal (thymic) large B-cell lymphoma, intrathoracic lymph nodes: Secondary | ICD-10-CM | POA: Diagnosis not present

## 2017-05-27 DIAGNOSIS — K449 Diaphragmatic hernia without obstruction or gangrene: Secondary | ICD-10-CM | POA: Insufficient documentation

## 2017-05-27 MED ORDER — HEPARIN SOD (PORK) LOCK FLUSH 100 UNIT/ML IV SOLN
500.0000 [IU] | Freq: Once | INTRAVENOUS | Status: AC
  Administered 2017-05-27: 500 [IU] via INTRAVENOUS

## 2017-05-27 MED ORDER — IOPAMIDOL (ISOVUE-300) INJECTION 61%
75.0000 mL | Freq: Once | INTRAVENOUS | Status: AC | PRN
  Administered 2017-05-27: 75 mL via INTRAVENOUS

## 2017-05-27 MED ORDER — HEPARIN SOD (PORK) LOCK FLUSH 100 UNIT/ML IV SOLN
INTRAVENOUS | Status: AC
  Filled 2017-05-27: qty 5

## 2017-05-27 NOTE — Telephone Encounter (Signed)
I informed the patient that the CT scan showed continued response to treatment as a tumor has decreased in size to 2.5 cm. She was very happy to hear this news.

## 2017-06-04 ENCOUNTER — Ambulatory Visit: Payer: Medicare Other | Admitting: Nutrition

## 2017-06-04 ENCOUNTER — Ambulatory Visit: Payer: Medicare Other

## 2017-06-04 ENCOUNTER — Other Ambulatory Visit (HOSPITAL_BASED_OUTPATIENT_CLINIC_OR_DEPARTMENT_OTHER): Payer: Medicare Other

## 2017-06-04 ENCOUNTER — Ambulatory Visit (HOSPITAL_BASED_OUTPATIENT_CLINIC_OR_DEPARTMENT_OTHER): Payer: Medicare Other

## 2017-06-04 ENCOUNTER — Ambulatory Visit (HOSPITAL_COMMUNITY)
Admission: RE | Admit: 2017-06-04 | Discharge: 2017-06-04 | Disposition: A | Discharge status: Home / Self Care | Payer: Medicare Other | Source: Ambulatory Visit | Attending: Adult Health | Admitting: Adult Health

## 2017-06-04 ENCOUNTER — Ambulatory Visit (HOSPITAL_BASED_OUTPATIENT_CLINIC_OR_DEPARTMENT_OTHER): Payer: Medicare Other | Admitting: Adult Health

## 2017-06-04 ENCOUNTER — Encounter: Payer: Self-pay | Admitting: Adult Health

## 2017-06-04 VITALS — BP 96/70 | HR 93 | Temp 98.1°F | Resp 17 | Wt 223.7 lb

## 2017-06-04 VITALS — BP 118/64 | HR 76 | Temp 98.0°F | Resp 16

## 2017-06-04 DIAGNOSIS — Z5112 Encounter for antineoplastic immunotherapy: Secondary | ICD-10-CM | POA: Diagnosis not present

## 2017-06-04 DIAGNOSIS — Z09 Encounter for follow-up examination after completed treatment for conditions other than malignant neoplasm: Secondary | ICD-10-CM | POA: Insufficient documentation

## 2017-06-04 DIAGNOSIS — K521 Toxic gastroenteritis and colitis: Secondary | ICD-10-CM | POA: Diagnosis not present

## 2017-06-04 DIAGNOSIS — C8522 Mediastinal (thymic) large B-cell lymphoma, intrathoracic lymph nodes: Secondary | ICD-10-CM

## 2017-06-04 DIAGNOSIS — Z5189 Encounter for other specified aftercare: Secondary | ICD-10-CM

## 2017-06-04 DIAGNOSIS — Z5111 Encounter for antineoplastic chemotherapy: Secondary | ICD-10-CM | POA: Diagnosis not present

## 2017-06-04 DIAGNOSIS — R530 Neoplastic (malignant) related fatigue: Secondary | ICD-10-CM

## 2017-06-04 DIAGNOSIS — R0609 Other forms of dyspnea: Secondary | ICD-10-CM | POA: Diagnosis not present

## 2017-06-04 DIAGNOSIS — R0602 Shortness of breath: Secondary | ICD-10-CM | POA: Diagnosis not present

## 2017-06-04 DIAGNOSIS — R51 Headache: Secondary | ICD-10-CM | POA: Diagnosis not present

## 2017-06-04 DIAGNOSIS — C383 Malignant neoplasm of mediastinum, part unspecified: Secondary | ICD-10-CM

## 2017-06-04 DIAGNOSIS — C8520 Mediastinal (thymic) large B-cell lymphoma, unspecified site: Secondary | ICD-10-CM | POA: Diagnosis not present

## 2017-06-04 DIAGNOSIS — E860 Dehydration: Secondary | ICD-10-CM | POA: Diagnosis not present

## 2017-06-04 LAB — COMPREHENSIVE METABOLIC PANEL
ALBUMIN: 3.3 g/dL — AB (ref 3.5–5.0)
ALK PHOS: 88 U/L (ref 40–150)
ALT: 19 U/L (ref 0–55)
AST: 23 U/L (ref 5–34)
Anion Gap: 11 mEq/L (ref 3–11)
BUN: 16.7 mg/dL (ref 7.0–26.0)
CO2: 23 mEq/L (ref 22–29)
Calcium: 9.6 mg/dL (ref 8.4–10.4)
Chloride: 106 mEq/L (ref 98–109)
Creatinine: 1.2 mg/dL — ABNORMAL HIGH (ref 0.6–1.1)
EGFR: 47 mL/min/{1.73_m2} — ABNORMAL LOW (ref >90)
GLUCOSE: 173 mg/dL — AB (ref 70–140)
POTASSIUM: 4.1 meq/L (ref 3.5–5.1)
Sodium: 140 mEq/L (ref 136–145)
Total Bilirubin: 0.55 mg/dL (ref 0.20–1.20)
Total Protein: 6.6 g/dL (ref 6.4–8.3)

## 2017-06-04 LAB — CBC WITH DIFFERENTIAL/PLATELET
BASO%: 2.2 % — ABNORMAL HIGH (ref 0.0–2.0)
Basophils Absolute: 0.1 10*3/uL (ref 0.0–0.1)
EOS%: 3 % (ref 0.0–7.0)
Eosinophils Absolute: 0.2 10*3/uL (ref 0.0–0.5)
HCT: 31.2 % — ABNORMAL LOW (ref 34.8–46.6)
HEMOGLOBIN: 10.4 g/dL — AB (ref 11.6–15.9)
LYMPH%: 5.2 % — ABNORMAL LOW (ref 14.0–49.7)
MCH: 32.5 pg (ref 25.1–34.0)
MCHC: 33.3 g/dL (ref 31.5–36.0)
MCV: 97.9 fL (ref 79.5–101.0)
MONO#: 0.8 10*3/uL (ref 0.1–0.9)
MONO%: 12.9 % (ref 0.0–14.0)
NEUT%: 76.7 % (ref 38.4–76.8)
NEUTROS ABS: 4.6 10*3/uL (ref 1.5–6.5)
Platelets: 244 10*3/uL (ref 145–400)
RBC: 3.19 10*6/uL — ABNORMAL LOW (ref 3.70–5.45)
RDW: 22.6 % — AB (ref 11.2–14.5)
WBC: 6 10*3/uL (ref 3.9–10.3)
lymph#: 0.3 10*3/uL — ABNORMAL LOW (ref 0.9–3.3)

## 2017-06-04 MED ORDER — SODIUM CHLORIDE 0.9% FLUSH
10.0000 mL | Freq: Once | INTRAVENOUS | Status: AC
  Administered 2017-06-04: 10 mL
  Filled 2017-06-04: qty 10

## 2017-06-04 MED ORDER — PALONOSETRON HCL INJECTION 0.25 MG/5ML
0.2500 mg | Freq: Once | INTRAVENOUS | Status: AC
  Administered 2017-06-04: 0.25 mg via INTRAVENOUS

## 2017-06-04 MED ORDER — SODIUM CHLORIDE 0.9 % IV SOLN
375.0000 mg/m2 | Freq: Once | INTRAVENOUS | Status: AC
  Administered 2017-06-04: 800 mg via INTRAVENOUS
  Filled 2017-06-04: qty 50

## 2017-06-04 MED ORDER — SODIUM CHLORIDE 0.9 % IV SOLN: 375.0000 mg/m2 | Freq: Once | INTRAVENOUS | Status: DC

## 2017-06-04 MED ORDER — PALONOSETRON HCL INJECTION 0.25 MG/5ML
INTRAVENOUS | Status: AC
  Filled 2017-06-04: qty 5

## 2017-06-04 MED ORDER — SODIUM CHLORIDE 0.9 % IV SOLN
Freq: Once | INTRAVENOUS | Status: AC
  Administered 2017-06-04: 10:00:00 via INTRAVENOUS

## 2017-06-04 MED ORDER — ACETAMINOPHEN 325 MG PO TABS
ORAL_TABLET | ORAL | Status: AC
  Filled 2017-06-04: qty 2

## 2017-06-04 MED ORDER — VINCRISTINE SULFATE CHEMO INJECTION 1 MG/ML
2.0000 mg | Freq: Once | INTRAVENOUS | Status: AC
  Administered 2017-06-04: 2 mg via INTRAVENOUS
  Filled 2017-06-04: qty 2

## 2017-06-04 MED ORDER — SODIUM CHLORIDE 0.9% FLUSH
10.0000 mL | INTRAVENOUS | Status: DC | PRN
  Administered 2017-06-04: 10 mL
  Filled 2017-06-04: qty 10

## 2017-06-04 MED ORDER — DEXAMETHASONE SODIUM PHOSPHATE 10 MG/ML IJ SOLN: 10.0000 mg | Freq: Once | INTRAMUSCULAR | Status: DC

## 2017-06-04 MED ORDER — SODIUM CHLORIDE 0.9 % IV SOLN
8.0000 mg | Freq: Once | INTRAVENOUS | Status: DC
Start: 2017-06-04 — End: 2017-06-04

## 2017-06-04 MED ORDER — SODIUM CHLORIDE 0.9 % IV SOLN
INTRAVENOUS | Status: DC
  Administered 2017-06-04: 10:00:00 via INTRAVENOUS

## 2017-06-04 MED ORDER — CYCLOPHOSPHAMIDE CHEMO INJECTION 1 GM
600.0000 mg/m2 | Freq: Once | INTRAMUSCULAR | Status: AC
  Administered 2017-06-04: 1320 mg via INTRAVENOUS
  Filled 2017-06-04: qty 66

## 2017-06-04 MED ORDER — DIPHENHYDRAMINE HCL 25 MG PO CAPS
ORAL_CAPSULE | ORAL | Status: AC
  Filled 2017-06-04: qty 2

## 2017-06-04 MED ORDER — PEGFILGRASTIM 6 MG/0.6ML ~~LOC~~ PSKT
6.0000 mg | PREFILLED_SYRINGE | Freq: Once | SUBCUTANEOUS | Status: AC
  Administered 2017-06-04: 6 mg via SUBCUTANEOUS
  Filled 2017-06-04: qty 0.6

## 2017-06-04 MED ORDER — DEXAMETHASONE SODIUM PHOSPHATE 10 MG/ML IJ SOLN
8.0000 mg | Freq: Once | INTRAMUSCULAR | Status: AC
  Administered 2017-06-04: 8 mg via INTRAVENOUS

## 2017-06-04 MED ORDER — DEXAMETHASONE SODIUM PHOSPHATE 10 MG/ML IJ SOLN
INTRAMUSCULAR | Status: AC
  Filled 2017-06-04: qty 1

## 2017-06-04 MED ORDER — HEPARIN SOD (PORK) LOCK FLUSH 100 UNIT/ML IV SOLN
500.0000 [IU] | Freq: Once | INTRAVENOUS | Status: AC | PRN
  Administered 2017-06-04: 500 [IU]
  Filled 2017-06-04: qty 5

## 2017-06-04 MED ORDER — DOXORUBICIN HCL CHEMO IV INJECTION 2 MG/ML
40.0000 mg/m2 | Freq: Once | INTRAVENOUS | Status: AC
  Administered 2017-06-04: 88 mg via INTRAVENOUS
  Filled 2017-06-04: qty 44

## 2017-06-04 MED ORDER — ACETAMINOPHEN 325 MG PO TABS
650.0000 mg | ORAL_TABLET | Freq: Once | ORAL | Status: AC
  Administered 2017-06-04: 650 mg via ORAL

## 2017-06-04 MED ORDER — DIPHENHYDRAMINE HCL 25 MG PO CAPS
50.0000 mg | ORAL_CAPSULE | Freq: Once | ORAL | Status: AC
  Administered 2017-06-04: 50 mg via ORAL

## 2017-06-04 NOTE — Patient Instructions (Addendum)
[facility] Discharge Instructions for Patients Receiving Chemotherapy  Today you received the following chemotherapy agents Adriamycin/Vincristine/Cytoxan/Rituxan To help prevent nausea and vomiting after your treatment, we encourage you to take your nausea medication as prescribed.   If you develop nausea and vomiting that is not controlled by your nausea medication, call the clinic.   BELOW ARE SYMPTOMS THAT SHOULD BE REPORTED IMMEDIATELY:  *FEVER GREATER THAN 100.5 F  *CHILLS WITH OR WITHOUT FEVER  NAUSEA AND VOMITING THAT IS NOT CONTROLLED WITH YOUR NAUSEA MEDICATION  *UNUSUAL SHORTNESS OF BREATH  *UNUSUAL BRUISING OR BLEEDING  TENDERNESS IN MOUTH AND THROAT WITH OR WITHOUT PRESENCE OF ULCERS  *URINARY PROBLEMS  *BOWEL PROBLEMS  UNUSUAL RASH Items with * indicate a potential emergency and should be followed up as soon as possible.  Feel free to call the clinic you have any questions or concerns. The clinic phone number is [phone].  Please show the [facility] at check-in to the Emergency Department and triage nurse.   Dehydration, Adult Dehydration is when there is not enough fluid or water in your body. This happens when you lose more fluids than you take in. Dehydration can range from mild to very bad. It should be treated right away to keep it from getting very bad. Symptoms of mild dehydration may include:  Thirst.  Dry lips.  Slightly dry mouth.  Dry, warm skin.  Dizziness. Symptoms of moderate dehydration may include:  Very dry mouth.  Muscle cramps.  Dark pee (urine). Pee may be the color of tea.  Your body making less pee.  Your eyes making fewer tears.  Heartbeat that is uneven or faster than normal (palpitations).  Headache.  Light-headedness, especially when you stand up from sitting.  Fainting (syncope). Symptoms of very bad dehydration may include:  Changes in skin, such as: ? Cold and clammy  skin. ? Blotchy (mottled) or pale skin. ? Skin that does not quickly return to normal after being lightly pinched and let go (poor skin turgor).  Changes in body fluids, such as: ? Feeling very thirsty. ? Your eyes making fewer tears. ? Not sweating when body temperature is high, such as in hot weather. ? Your body making very little pee.  Changes in vital signs, such as: ? Weak pulse. ? Pulse that is more than 100 beats a minute when you are sitting still. ? Fast breathing. ? Low blood pressure.  Other changes, such as: ? Sunken eyes. ? Cold hands and feet. ? Confusion. ? Lack of energy (lethargy). ? Trouble waking up from sleep. ? Short-term weight loss. ? Unconsciousness. Follow these instructions at home:  If told by your doctor, drink an ORS: ? Make an ORS by using instructions on the package. ? Start by drinking small amounts, about  cup (120 mL) every 5-10 minutes. ? Slowly drink more until you have had the amount that your doctor said to have.  Drink enough clear fluid to keep your pee clear or pale yellow. If you were told to drink an ORS, finish the ORS first, then start slowly drinking clear fluids. Drink fluids such as: ? Water. Do not drink only water by itself. Doing that can make the salt (sodium) level in your body get too low (hyponatremia). ? Ice chips. ? Fruit juice that you have added water to (diluted). ? Low-calorie sports drinks.  Avoid: ? Alcohol. ? Drinks that have a lot of sugar. These include high-calorie sports drinks, fruit juice that does  not have water added, and soda. ? Caffeine. ? Foods that are greasy or have a lot of fat or sugar.  Take over-the-counter and prescription medicines only as told by your doctor.  Do not take salt tablets. Doing that can make the salt level in your body get too high (hypernatremia).  Eat foods that have minerals (electrolytes). Examples include bananas, oranges, potatoes, tomatoes, and spinach.  Keep all  follow-up visits as told by your doctor. This is important. Contact a doctor if:  You have belly (abdominal) pain that: ? Gets worse. ? Stays in one area (localizes).  You have a rash.  You have a stiff neck.  You get angry or annoyed more easily than normal (irritability).  You are more sleepy than normal.  You have a harder time waking up than normal.  You feel: ? Weak. ? Dizzy. ? Very thirsty.  You have peed (urinated) only a small amount of very dark pee during 6-8 hours. Get help right away if:  You have symptoms of very bad dehydration.  You cannot drink fluids without throwing up (vomiting).  Your symptoms get worse with treatment.  You have a fever.  You have a very bad headache.  You are throwing up or having watery poop (diarrhea) and it: ? Gets worse. ? Does not go away.  You have blood or something green (bile) in your throw-up.  You have blood in your poop (stool). This may cause poop to look black and tarry.  You have not peed in 6-8 hours.  You pass out (faint).  Your heart rate when you are sitting still is more than 100 beats a minute.  You have trouble breathing. This information is not intended to replace advice given to you by your health care provider. Make sure you discuss any questions you have with your health care provider. Document Released: 07/06/2009 Document Revised: 03/29/2016 Document Reviewed: 11/03/2015 Elsevier Interactive Patient Education  2018 [employer].  Pegfilgrastim injection What is this medicine? PEGFILGRASTIM (PEG fil gra stim) is a long-acting granulocyte colony-stimulating factor that stimulates the growth of neutrophils, a type of white blood cell important in the body's fight against infection. It is used to reduce the incidence of fever and infection in patients with certain types of cancer who are receiving chemotherapy that affects the bone marrow, and to increase survival after being exposed to high doses  of radiation. This medicine may be used for other purposes; ask your health care provider or pharmacist if you have questions. COMMON BRAND NAME(S): Neulasta What should I tell my health care provider before I take this medicine? They need to know if you have any of these conditions: -kidney disease -latex allergy -ongoing radiation therapy -sickle cell disease -skin reactions to acrylic adhesives (On-Body Injector only) -an unusual or allergic reaction to pegfilgrastim, filgrastim, other medicines, foods, dyes, or preservatives -pregnant or trying to get pregnant -breast-feeding How should I use this medicine? This medicine is for injection under the skin. If you get this medicine at home, you will be taught how to prepare and give the pre-filled syringe or how to use the On-body Injector. Refer to the patient Instructions for Use for detailed instructions. Use exactly as directed. Tell your healthcare provider immediately if you suspect that the On-body Injector may not have performed as intended or if you suspect the use of the On-body Injector resulted in a missed or partial dose. It is important that you put your used needles and syringes in  a special sharps container. Do not put them in a trash can. If you do not have a sharps container, call your pharmacist or healthcare provider to get one. Talk to your pediatrician regarding the use of this medicine in children. While this drug may be prescribed for selected conditions, precautions do apply. Overdosage: If you think you have taken too much of this medicine contact a poison control center or emergency room at once. NOTE: This medicine is only for you. Do not share this medicine with others. What if I miss a dose? It is important not to miss your dose. Call your doctor or health care professional if you miss your dose. If you miss a dose due to an On-body Injector failure or leakage, a new dose should be administered as soon as possible  using a single prefilled syringe for manual use. What may interact with this medicine? Interactions have not been studied. Give your health care provider a list of all the medicines, herbs, non-prescription drugs, or dietary supplements you use. Also tell them if you smoke, drink alcohol, or use illegal drugs. Some items may interact with your medicine. This list may not describe all possible interactions. Give your health care provider a list of all the medicines, herbs, non-prescription drugs, or dietary supplements you use. Also tell them if you smoke, drink alcohol, or use illegal drugs. Some items may interact with your medicine. What should I watch for while using this medicine? You may need blood work done while you are taking this medicine. If you are going to need a MRI, CT scan, or other procedure, tell your doctor that you are using this medicine (On-Body Injector only). What side effects may I notice from receiving this medicine? Side effects that you should report to your doctor or health care professional as soon as possible: -allergic reactions like skin rash, itching or hives, swelling of the face, lips, or tongue -dizziness -fever -pain, redness, or irritation at site where injected -pinpoint red spots on the skin -red or dark-brown urine -shortness of breath or breathing problems -stomach or side pain, or pain at the shoulder -swelling -tiredness -trouble passing urine or change in the amount of urine Side effects that usually do not require medical attention (report to your doctor or health care professional if they continue or are bothersome): -bone pain -muscle pain This list may not describe all possible side effects. Call your doctor for medical advice about side effects. You may report side effects to FDA at 1-800-FDA-1088. Where should I keep my medicine? Keep out of the reach of children. Store pre-filled syringes in a refrigerator between 2 and 8 degrees C (36  and 46 degrees F). Do not freeze. Keep in carton to protect from light. Throw away this medicine if it is left out of the refrigerator for more than 48 hours. Throw away any unused medicine after the expiration date. NOTE: This sheet is a summary. It may not cover all possible information. If you have questions about this medicine, talk to your doctor, pharmacist, or health care provider.  2018 Elsevier/Gold Standard (2016-09-05 12:58:03)

## 2017-06-04 NOTE — Progress Notes (Signed)
Patient requested a nutrition consult during infusion today. Patient reports she would like to know how much protein she should eat every day. Patient states she has lost 7 pounds over the past 2 weeks and has a poor appetite. Provided education on strategies for increasing protein throughout the day in smaller meals and snacks. Provided fact sheet on increasing calories and protein. Educated patient that goal is weight maintenance. Educated patient on strategies for improving appetite. Questions were answered.  Teach back method used.  Contact information provided.  **Disclaimer: This note was dictated with voice recognition software. Similar sounding words can inadvertently be transcribed and this note may contain transcription errors which may not have been corrected upon publication of note.**

## 2017-06-04 NOTE — Progress Notes (Signed)
[facility] Follow up:    Kayla Bowen, MD [place] [address]   DIAGNOSIS: Cancer Staging No matching staging information was found for the patient.  SUMMARY OF ONCOLOGIC HISTORY:   Non-Hodgkin lymphoma, unspecified, intrathoracic lymph nodes ([place])   02/03/2017 Imaging    Large superior mediastinal mass contiguous with right hilar region encasing the trachea and right mainstem bronchus measuring 5.4 x 7.5 x 6 cm causing SVC obstruction, additional mediastinal lymph nodes subcarinal 1.6 cm and left hilar 1 cm; multiple small pulmonary nodules 4-6 mm       02/11/2017 Initial Diagnosis    Flow cytometry of mediastinal mass revealed monoclonal population of B cells with expression of CD10 consistent with non-Hodgkin B-cell lymphoma germinal center operation; bone marrow biopsy negative for lymphoma      04/02/2017 -  Chemotherapy    R-CHOP X 6 cycles        CURRENT THERAPY: R-CHOP chemotherapy  INTERVAL HISTORY: Kayla Harrison 71 y.o. female returns for evaluation prior to receiving chemotherapy today.  She is doing moderately well.  She has noted a slight increase in shortness of breath, and waking up in the middle of the night to catch her breath.  She has a NP cough, that has worsened over the past few weeks.  She did have a CT scan on 9/4 that demonstrated ground glass opacities in her lungs. She wants to know if this has anything to do with it.     Patient Active Problem List   Diagnosis Date Noted  . Port catheter in place 04/02/2017  . Non-Hodgkin lymphoma, unspecified, intrathoracic lymph nodes ([place]) 02/13/2017  . Mediastinal mass 02/06/2017  . 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[place]) 02/06/2017  . Abnormal chest x-ray   . Lung mass 02/03/2017  . 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  . OSA (obstructive sleep apnea) 02/03/2015  . History of renal calculi 11/16/2014  . Renal calculi 11/16/2014  . Family history of colon cancer  10/26/2014  . Encounter for therapeutic drug monitoring 11/24/2013  . Mechanical complication due to cardiac pacemaker pulse generator 11/06/2013  . Dyspnea 05/12/2013  . Hypothyroidism 04/21/2013  . Pleural effusion 04/19/2013  . Long term (current) use of anticoagulants 12/20/2010  . EPIDERMOID CYST 08/22/2010  . CHRONIC DIASTOLIC HEART FAILURE 34/74/2595  . SYNCOPE AND COLLAPSE 07/27/2010  . OSTEOARTHRITIS, KNEE, RIGHT 03/15/2010  . Morbid obesity ([place]) 12/07/2009  . NONSPEC ELEVATION OF LEVELS OF TRANSAMINASE/LDH 09/08/2009  . CVA 08/14/2009  . BREAST CANCER, HX OF 07/25/2009  . COLONIC POLYPS, HX OF 07/25/2009  . TUBULOVILLOUS ADENOMA, COLON 04/29/2008  . HYPERGLYCEMIA, FASTING 04/29/2008  . HYPERLIPIDEMIA 06/22/2007  . Atrial fibrillation ([place]) 06/22/2007  . CARCINOMA, THYROID GLAND, HX OF 06/22/2007    is allergic to tikosyn [dofetilide]; xarelto [rivaroxaban]; epinephrine; ketamine; and adhesive [tape].  MEDICAL HISTORY: Past Medical History:  Diagnosis Date  . Abnormally small mouth    per pt "I have a small mouth"  . Arthritis    osteoarthritis - knees   . Atrial fibrillation [facility])    ablation- 2x's-- 1st time- [facility], 2nd event at [facility] in 2008. Convergent ablation at [facility] 6/14  . Breast cancer ([place])    Dr Margot Chimes, total thyroidectomy- 1999- for cancer  . Colon polyp    Dr Earlean Shawl  . Complication of anesthesia    Ketamine produces LSD reaction, bright colored nightmarish experience   . Dyslipidemia   . Dysrhythmia    a fib with  internal/external ablation, currently NSR  . H/O pleural effusion    s/p thoracentesis w 3280m withdrawn  . Hepatitis    [place] ?hepatitis   . History of dysphagia    due to radiation therapy  . History of kidney stones   . Hx of thyroid cancer    Dr SForde Dandy . Hyperlipidemia   . Hypothyroidism   . Lung cancer, lower lobe ([place] 01/2017   radiation RX completed 03/04/17; will start chemo 6/27   . Morbid obesity ([place]    Status post lap band surgery  . Normal cardiac stress test    2008- cardiac stress/echo  . Pacemaker-[employer]   . [facility]   . Renal calculi   . Right shoulder pain    needs shoulder replacement  . Sinus node dysfunction ([place]    Complicating convergent ablation 6/14  . [place]    [place]  . Wears glasses    readers    SURGICAL HISTORY: Past Surgical History:  Procedure Laterality Date  . ABDOMINAL HYSTERECTOMY  1983  . afib ablation     X 2; [facility] & Dr KCaryl Comes . APPENDECTOMY    . BONE MARROW BIOPSY  02/21/2017  . BREAST LUMPECTOMY  2010  . bso  1998  . CARDIOVERSION  10/09/2012   Procedure: CARDIOVERSION;  Surgeon: JMinus Breeding MD;  [place]  Service: Cardiovascular;  Laterality: N/A;  . CARDIOVERSION  10/09/2012   Procedure: CARDIOVERSION;  Surgeon: JMinus Breeding MD;  [facility];  Service: Cardiovascular;  Laterality: N/A;  [name] the ok to add pt to the add on , but we must check to find out if the can add pt on at [phone] ( [phone])  . CARDIOVERSION N/A 11/20/2012   Procedure: CARDIOVERSION;  Surgeon: PFay Records MD;  [facility];  Service: Cardiovascular;  Laterality: N/A;  . CHOLECYSTECTOMY    . COLONOSCOPY W/ POLYPECTOMY     Dr MEarlean Shawl . convergent    . CYSTOSCOPY N/A 02/06/2015   Procedure: CYSTOSCOPY;  Surgeon: MKathie Rhodes MD;  [facility];  Service: Urology;  Laterality: N/A;  . CYSTOSCOPY WITH RETROGRADE PYELOGRAM, URETEROSCOPY AND STENT PLACEMENT Right 02/06/2015   Procedure: RETROGRADE PYELOGRAM, RIGHT URETEROSCOPY STENT PLACEMENT;  Surgeon: MKathie Rhodes MD;  Location[facility];  Service: Urology;  Laterality: Right;  . CYSTOSCOPY WITH RETROGRADE PYELOGRAM, URETEROSCOPY AND STENT PLACEMENT Right 03/07/2017   Procedure: CYSTOSCOPY WITH RIGHT RETROGRADE PYELOGRAM,RIGHT  URETEROSCOPYLASER LITHOTRIPSY  AND STENT PLACEMENT AND STONE  BASKETRY;  Surgeon: OKathie Rhodes MD;  [place]  Service: Urology;  Laterality: Right;  . EYE SURGERY     cataract surgery  . HOLMIUM LASER APPLICATION N/A 53/55/7322  Procedure: HOLMIUM LASER APPLICATION;  Surgeon: MKathie Rhodes MD;  Location[facility];  Service: Urology;  Laterality: N/A;  . HOLMIUM LASER APPLICATION Right 60/25/4270  Procedure: HOLMIUM LASER APPLICATION;  Surgeon: OKathie Rhodes MD;  [facility]  Service: Urology;  Laterality: Right;  . IR FLUORO GUIDE PORT INSERTION RIGHT  02/24/2017  . IR PATIENT EVAL TECH 0-60 MINS  03/11/2017  . IR [place] ACC[place] RIGHT  02/24/2017  . JOINT REPLACEMENT  04/13/12   Right Knee replacement  . KNEE ARTHROSCOPY     bilateral  . LAPAROSCOPIC GASTRIC BANDING  07/10/2010  . LAPAROTOMY  for ruptured ovary and ovarian artery   . PACEMAKER INSERTION  03/10/13   [facility]  . POCKET REVISION N/A 12/08/2013   Procedure: POCKET REVISION;  Surgeon: Deboraha Sprang, MD;  [facility];  Service: Cardiovascular;  Laterality: N/A;  . THYROIDECTOMY  1998   Dr Margot Chimes  . TONSILLECTOMY    . TOTAL KNEE ARTHROPLASTY  04/13/2012   Procedure: TOTAL KNEE ARTHROPLASTY;  Surgeon: Rudean Haskell, MD;  Location: [place];  Service: Orthopedics;  Laterality: Right;  . Transabdominal ablation  03/02/13   [facility]  . VIDEO BRONCHOSCOPY WITH ENDOBRONCHIAL ULTRASOUND N/A 02/07/2017   Procedure: VIDEO BRONCHOSCOPY WITH ENDOBRONCHIAL ULTRASOUND;  Surgeon: Marshell Garfinkel, MD;  Location: [place];  Service: Pulmonary;  Laterality: N/A;    SOCIAL HISTORY: Social History   Social History  . Marital status: Single    Spouse name: N/A  . Number of children: 0  . Years of education: N/A   Occupational History  . [employer] Retired   Social History Main Topics  . Smoking status: Never Smoker  . Smokeless tobacco: Never Used  . Alcohol use No  . Drug use: No  . Sexual activity:  Not on file   Other Topics Concern  . Not on file   Social History Narrative   SINGLE    NO CHILDREN   NEVER SMOKED   EXERCISE 3 X WK          FAMILY HISTORY: Family History  Problem Relation Age of Onset  . Heart disease Father 10       MI _0   . Colon cancer Father        COLON  . Heart attack Father   . Diabetes Sister   . Diabetes Brother   . Diabetes Paternal Aunt   . Diabetes Paternal Grandmother     Review of Systems  Constitutional: Positive for fatigue. Negative for appetite change, chills, fever and unexpected weight change.  HENT:   Negative for hearing loss and lump/mass.   Eyes: Negative for eye problems and icterus.  Respiratory: Positive for cough and shortness of breath. Negative for chest tightness, hemoptysis and wheezing.   Cardiovascular: Negative for chest pain, leg swelling and palpitations.  Gastrointestinal: Negative for abdominal distention, abdominal pain, constipation, diarrhea, nausea and vomiting.  Endocrine: Negative for hot flashes.  Genitourinary: Negative for difficulty urinating.   Musculoskeletal: Negative for arthralgias.  Skin: Negative for itching and rash.  Neurological: Negative for dizziness, extremity weakness, headaches and numbness.  Hematological: Negative for adenopathy.  Psychiatric/Behavioral: Negative for depression. The patient is not nervous/anxious.       PHYSICAL EXAMINATION  ECOG PERFORMANCE STATUS: 1 - Symptomatic but completely ambulatory  Vitals:   06/04/17 0907 06/04/17 0910  BP: (!) 154/85 96/70  Pulse: 85 93  Resp:    Temp:    SpO2: 97% 99%    Physical Exam  Constitutional: She is oriented to person, place, and time and well-developed, well-nourished, and in no distress.  HENT:  Head: Normocephalic and atraumatic.  Mouth/Throat: Oropharynx is clear and moist. No oropharyngeal exudate.  Eyes: Pupils are equal, round, and reactive to light. No scleral icterus.  Neck: Neck supple.   Cardiovascular: Normal rate, regular rhythm and normal heart sounds.   Pulmonary/Chest: Effort normal and breath sounds normal.  Abdominal: Soft. Bowel sounds are normal.  Musculoskeletal: She exhibits no edema.  Lymphadenopathy:    She has no cervical adenopathy.  Neurological: She is alert and oriented  to person, place, and time.  Psychiatric: Mood and affect normal.    LABORATORY DATA:  CBC    Component Value Date/Time   WBC 6.0 06/04/2017 0811   WBC 4.8 02/21/2017 0944   RBC 3.19 (L) 06/04/2017 0811   RBC 4.38 02/21/2017 0944   HGB 10.4 (L) 06/04/2017 0811   HCT 31.2 (L) 06/04/2017 0811   PLT 244 06/04/2017 0811   MCV 97.9 06/04/2017 0811   MCH 32.5 06/04/2017 0811   MCH 29.0 02/21/2017 0944   MCHC 33.3 06/04/2017 0811   MCHC 32.6 02/21/2017 0944   RDW 22.6 (H) 06/04/2017 0811   LYMPHSABS 0.3 (L) 06/04/2017 0811   MONOABS 0.8 06/04/2017 0811   EOSABS 0.2 06/04/2017 0811   BASOSABS 0.1 06/04/2017 0811    CMP     Component Value Date/Time   NA 140 06/04/2017 0811   K 4.1 06/04/2017 0811   CL 106 02/08/2017 0233   CO2 23 06/04/2017 0811   GLUCOSE 173 (H) 06/04/2017 0811   BUN 16.7 06/04/2017 0811   CREATININE 1.2 (H) 06/04/2017 0811   CALCIUM 9.6 06/04/2017 0811   PROT 6.6 06/04/2017 0811   ALBUMIN 3.3 (L) 06/04/2017 0811   AST 23 06/04/2017 0811   ALT 19 06/04/2017 0811   ALKPHOS 88 06/04/2017 0811   BILITOT 0.55 06/04/2017 0811   GFRNONAA 54 (L) 02/08/2017 0233   GFRAA >60 02/08/2017 0233         ASSESSMENT and THERAPY PLAN:   Non-Hodgkin lymphoma, unspecified, intrathoracic lymph nodes ([place]) 02/03/2017: Large superior mediastinal mass contiguous with right hilar region encasing the trachea and right mainstem bronchus measuring 5.4 x 7.5 x 6 cm causing SVC obstruction, additional mediastinal lymph nodes subcarinal 1.6 cm and left hilar 1 cm; multiple small pulmonary nodules 4-6 mm   Bronchoscopy and biopsy 02/09/2017: Monoclonal B-cell population  expressing CD10 consistent with non-Hodgkin's B-cell lymphoma germinal center origin Bone marrow biopsy 02/21/2017: No evidence of lymphoma PET/CT scan 27/11/5007: Hypermetabolic right paratracheal nodal mass ----------------------------------------------------------------------- Current treatment: Cycle 4 day 1 R-CHOP Chemotherapy toxicities: 1Severe profound diarrhea: In spite of Lomotil and Imodium, she is alternating these and this is manageable. 2. Fatigue and headaches, difficulty sleeping night after chemo-I reduced her Dexamethasone slightly 3. Dehydration: IV fluids today with chemo (500cc). Electrolytes are being monitored. 4. Shortness of breath: hard to tell what the etiology is.  I have ordered a repeat echocardiogram, and she will have chest xray today after discussion with Dr. Lindi Adie.  ? Deconditioning.  She will proceed with chemotherapy today and we will see her back for her next cycle or sooner if needed.       Orders Placed This Encounter  Procedures  . DG Chest 2 View    Standing Status:   Future    Standing Expiration Date:   06/04/2018    Order Specific Question:   Reason for Exam (SYMPTOM  OR DIAGNOSIS REQUIRED)    Answer:   shortness of breath    Order Specific Question:   Preferred imaging location?    Answer:   [facility]    Order Specific Question:   Radiology Contrast Protocol - do NOT remove file path    Answer:   [email or web address]  . ECHOCARDIOGRAM COMPLETE    Standing Status:   Future    Standing Expiration Date:   09/03/2018    Order Specific Question:   Where should this test be performed    Answer:   [facility]  Order Specific Question:   Perflutren DEFINITY (image enhancing agent) should be administered unless hypersensitivity or allergy exist    Answer:   Administer Perflutren    Order Specific Question:   Expected Date:    Answer:   ASAP    All questions were answered. The patient knows to  call the clinic with any problems, questions or concerns. We can certainly see the patient much sooner if necessary.  A total of (30) minutes of face-to-face time was spent with this patient with greater than 50% of that time in counseling and care-coordination.  This note was electronically signed. Scot Dock, NP 06/04/2017

## 2017-06-04 NOTE — Assessment & Plan Note (Addendum)
02/03/2017: Large superior mediastinal mass contiguous with right hilar region encasing the trachea and right mainstem bronchus measuring 5.4 x 7.5 x 6 cm causing SVC obstruction, additional mediastinal lymph nodes subcarinal 1.6 cm and left hilar 1 cm; multiple small pulmonary nodules 4-6 mm   Bronchoscopy and biopsy 02/09/2017: Monoclonal B-cell population expressing CD10 consistent with non-Hodgkin's B-cell lymphoma germinal center origin Bone marrow biopsy 02/21/2017: No evidence of lymphoma PET/CT scan 09/15/8249: Hypermetabolic right paratracheal nodal mass ----------------------------------------------------------------------- Current treatment: Cycle 4 day 1 R-CHOP Chemotherapy toxicities: 1Severe profound diarrhea: In spite of Lomotil and Imodium, she is alternating these and this is manageable. 2. Fatigue and headaches, difficulty sleeping night after chemo-I reduced her Dexamethasone slightly 3. Dehydration: IV fluids today with chemo (500cc). Electrolytes are being monitored. 4. Shortness of breath: hard to tell what the etiology is.  I have ordered a repeat echocardiogram, and she will have chest xray today after discussion with Dr. Lindi Adie.  ? Deconditioning.  She will proceed with chemotherapy today and we will see her back for her next cycle or sooner if needed.

## 2017-06-11 ENCOUNTER — Ambulatory Visit (HOSPITAL_COMMUNITY): Payer: Medicare Other | Attending: Cardiovascular Disease

## 2017-06-11 ENCOUNTER — Ambulatory Visit (HOSPITAL_BASED_OUTPATIENT_CLINIC_OR_DEPARTMENT_OTHER): Payer: Medicare Other | Admitting: Medical

## 2017-06-11 ENCOUNTER — Other Ambulatory Visit: Payer: Medicare Other

## 2017-06-11 ENCOUNTER — Telehealth: Payer: Self-pay

## 2017-06-11 ENCOUNTER — Other Ambulatory Visit: Payer: Self-pay

## 2017-06-11 ENCOUNTER — Ambulatory Visit (HOSPITAL_BASED_OUTPATIENT_CLINIC_OR_DEPARTMENT_OTHER): Payer: Medicare Other

## 2017-06-11 ENCOUNTER — Other Ambulatory Visit (HOSPITAL_BASED_OUTPATIENT_CLINIC_OR_DEPARTMENT_OTHER): Payer: Medicare Other

## 2017-06-11 VITALS — BP 139/73 | HR 77 | Temp 98.0°F | Resp 16

## 2017-06-11 DIAGNOSIS — R531 Weakness: Secondary | ICD-10-CM | POA: Diagnosis not present

## 2017-06-11 DIAGNOSIS — R0602 Shortness of breath: Secondary | ICD-10-CM

## 2017-06-11 DIAGNOSIS — E860 Dehydration: Secondary | ICD-10-CM | POA: Diagnosis not present

## 2017-06-11 DIAGNOSIS — R05 Cough: Secondary | ICD-10-CM

## 2017-06-11 DIAGNOSIS — I340 Nonrheumatic mitral (valve) insufficiency: Secondary | ICD-10-CM | POA: Diagnosis not present

## 2017-06-11 DIAGNOSIS — R0609 Other forms of dyspnea: Secondary | ICD-10-CM | POA: Insufficient documentation

## 2017-06-11 DIAGNOSIS — R42 Dizziness and giddiness: Secondary | ICD-10-CM | POA: Diagnosis not present

## 2017-06-11 DIAGNOSIS — C50919 Malignant neoplasm of unspecified site of unspecified female breast: Secondary | ICD-10-CM | POA: Diagnosis not present

## 2017-06-11 DIAGNOSIS — Z09 Encounter for follow-up examination after completed treatment for conditions other than malignant neoplasm: Secondary | ICD-10-CM | POA: Diagnosis not present

## 2017-06-11 DIAGNOSIS — C8522 Mediastinal (thymic) large B-cell lymphoma, intrathoracic lymph nodes: Secondary | ICD-10-CM

## 2017-06-11 DIAGNOSIS — R197 Diarrhea, unspecified: Secondary | ICD-10-CM

## 2017-06-11 DIAGNOSIS — E785 Hyperlipidemia, unspecified: Secondary | ICD-10-CM | POA: Diagnosis not present

## 2017-06-11 DIAGNOSIS — J700 Acute pulmonary manifestations due to radiation: Secondary | ICD-10-CM | POA: Diagnosis not present

## 2017-06-11 DIAGNOSIS — C3490 Malignant neoplasm of unspecified part of unspecified bronchus or lung: Secondary | ICD-10-CM | POA: Diagnosis not present

## 2017-06-11 DIAGNOSIS — I4891 Unspecified atrial fibrillation: Secondary | ICD-10-CM | POA: Insufficient documentation

## 2017-06-11 DIAGNOSIS — C383 Malignant neoplasm of mediastinum, part unspecified: Secondary | ICD-10-CM

## 2017-06-11 LAB — COMPREHENSIVE METABOLIC PANEL
ALBUMIN: 3.2 g/dL — AB (ref 3.5–5.0)
ALK PHOS: 95 U/L (ref 40–150)
ALT: 15 U/L (ref 0–55)
ANION GAP: 9 meq/L (ref 3–11)
AST: 10 U/L (ref 5–34)
BUN: 20.1 mg/dL (ref 7.0–26.0)
CALCIUM: 9 mg/dL (ref 8.4–10.4)
CHLORIDE: 102 meq/L (ref 98–109)
CO2: 26 mEq/L (ref 22–29)
Creatinine: 0.9 mg/dL (ref 0.6–1.1)
EGFR: 67 mL/min/{1.73_m2} — AB (ref >90)
Glucose: 128 mg/dl (ref 70–140)
POTASSIUM: 4.4 meq/L (ref 3.5–5.1)
Sodium: 138 mEq/L (ref 136–145)
Total Bilirubin: 0.64 mg/dL (ref 0.20–1.20)
Total Protein: 6.1 g/dL — ABNORMAL LOW (ref 6.4–8.3)

## 2017-06-11 LAB — CBC WITH DIFFERENTIAL/PLATELET
BASO%: 2.7 % — AB (ref 0.0–2.0)
Basophils Absolute: 0 10*3/uL (ref 0.0–0.1)
EOS%: 12 % — ABNORMAL HIGH (ref 0.0–7.0)
Eosinophils Absolute: 0.2 10*3/uL (ref 0.0–0.5)
HEMATOCRIT: 28.8 % — AB (ref 34.8–46.6)
HEMOGLOBIN: 9.3 g/dL — AB (ref 11.6–15.9)
LYMPH#: 0.1 10*3/uL — AB (ref 0.9–3.3)
LYMPH%: 8.3 % — AB (ref 14.0–49.7)
MCH: 31.7 pg (ref 25.1–34.0)
MCHC: 32.3 g/dL (ref 31.5–36.0)
MCV: 98.3 fL (ref 79.5–101.0)
MONO#: 0.2 10*3/uL (ref 0.1–0.9)
MONO%: 13 % (ref 0.0–14.0)
NEUT#: 1 10*3/uL — ABNORMAL LOW (ref 1.5–6.5)
NEUT%: 64 % (ref 38.4–76.8)
Platelets: 126 10*3/uL — ABNORMAL LOW (ref 145–400)
RBC: 2.93 10*6/uL — ABNORMAL LOW (ref 3.70–5.45)
RDW: 20.3 % — ABNORMAL HIGH (ref 11.2–14.5)
WBC: 1.5 10*3/uL — ABNORMAL LOW (ref 3.9–10.3)
nRBC: 0 % (ref 0–0)

## 2017-06-11 LAB — MAGNESIUM: MAGNESIUM: 1.9 mg/dL (ref 1.5–2.5)

## 2017-06-11 MED ORDER — PREDNISONE 10 MG PO TABS: ORAL_TABLET | ORAL | 0 refills | Status: DC

## 2017-06-11 MED ORDER — SODIUM CHLORIDE 0.9 % IV SOLN
INTRAVENOUS | Status: DC
  Administered 2017-06-11: 16:00:00 via INTRAVENOUS

## 2017-06-11 MED ORDER — HEPARIN SOD (PORK) LOCK FLUSH 100 UNIT/ML IV SOLN
500.0000 [IU] | Freq: Once | INTRAVENOUS | Status: AC
  Administered 2017-06-11: 500 [IU]
  Filled 2017-06-11: qty 5

## 2017-06-11 MED ORDER — SODIUM CHLORIDE 0.9% FLUSH
10.0000 mL | Freq: Once | INTRAVENOUS | Status: AC
  Administered 2017-06-11: 10 mL
  Filled 2017-06-11: qty 10

## 2017-06-11 NOTE — Patient Instructions (Signed)
Dehydration, Adult Dehydration is a condition in which there is not enough fluid or water in the body. This happens when you lose more fluids than you take in. Important organs, such as the kidneys, brain, and heart, cannot function without a proper amount of fluids. Any loss of fluids from the body can lead to dehydration. Dehydration can range from mild to severe. This condition should be treated right away to prevent it from becoming severe. What are the causes? This condition may be caused by:  Vomiting.  Diarrhea.  Excessive sweating, such as from heat exposure or exercise.  Not drinking enough fluid, especially: ? When ill. ? While doing activity that requires a lot of energy.  Excessive urination.  Fever.  Infection.  Certain medicines, such as medicines that cause the body to lose excess fluid (diuretics).  Inability to access safe drinking water.  Reduced physical ability to get adequate water and food.  What increases the risk? This condition is more likely to develop in people:  Who have a poorly controlled long-term (chronic) illness, such as diabetes, heart disease, or kidney disease.  Who are age 65 or older.  Who are disabled.  Who live in a place with high altitude.  Who play endurance sports.  What are the signs or symptoms? Symptoms of mild dehydration may include:  Thirst.  Dry lips.  Slightly dry mouth.  Dry, warm skin.  Dizziness. Symptoms of moderate dehydration may include:  Very dry mouth.  Muscle cramps.  Dark urine. Urine may be the color of tea.  Decreased urine production.  Decreased tear production.  Heartbeat that is irregular or faster than normal (palpitations).  Headache.  Light-headedness, especially when you stand up from a sitting position.  Fainting (syncope). Symptoms of severe dehydration may include:  Changes in skin, such as: ? Cold and clammy skin. ? Blotchy (mottled) or pale skin. ? Skin that does  not quickly return to normal after being lightly pinched and released (poor skin turgor).  Changes in body fluids, such as: ? Extreme thirst. ? No tear production. ? Inability to sweat when body temperature is high, such as in hot weather. ? Very little urine production.  Changes in vital signs, such as: ? Weak pulse. ? Pulse that is more than 100 beats a minute when sitting still. ? Rapid breathing. ? Low blood pressure.  Other changes, such as: ? Sunken eyes. ? Cold hands and feet. ? Confusion. ? Lack of energy (lethargy). ? Difficulty waking up from sleep. ? Short-term weight loss. ? Unconsciousness. How is this diagnosed? This condition is diagnosed based on your symptoms and a physical exam. Blood and urine tests may be done to help confirm the diagnosis. How is this treated? Treatment for this condition depends on the severity. Mild or moderate dehydration can often be treated at home. Treatment should be started right away. Do not wait until dehydration becomes severe. Severe dehydration is an emergency and it needs to be treated in a hospital. Treatment for mild dehydration may include:  Drinking more fluids.  Replacing salts and minerals in your blood (electrolytes) that you may have lost. Treatment for moderate dehydration may include:  Drinking an oral rehydration solution (ORS). This is a drink that helps you replace fluids and electrolytes (rehydrate). It can be found at pharmacies and retail stores. Treatment for severe dehydration may include:  Receiving fluids through an IV tube.  Receiving an electrolyte solution through a feeding tube that is passed through your nose   and into your stomach (nasogastric tube, or NG tube).  Correcting any abnormalities in electrolytes.  Treating the underlying cause of dehydration. Follow these instructions at home:  If directed by your health care provider, drink an ORS: ? Make an ORS by following instructions on the  package. ? Start by drinking small amounts, about  cup (120 mL) every 5-10 minutes. ? Slowly increase how much you drink until you have taken the amount recommended by your health care provider.  Drink enough clear fluid to keep your urine clear or pale yellow. If you were told to drink an ORS, finish the ORS first, then start slowly drinking other clear fluids. Drink fluids such as: ? Water. Do not drink only water. Doing that can lead to having too little salt (sodium) in the body (hyponatremia). ? Ice chips. ? Fruit juice that you have added water to (diluted fruit juice). ? Low-calorie sports drinks.  Avoid: ? Alcohol. ? Drinks that contain a lot of sugar. These include high-calorie sports drinks, fruit juice that is not diluted, and soda. ? Caffeine. ? Foods that are greasy or contain a lot of fat or sugar.  Take over-the-counter and prescription medicines only as told by your health care provider.  Do not take sodium tablets. This can lead to having too much sodium in the body (hypernatremia).  Eat foods that contain a healthy balance of electrolytes, such as bananas, oranges, potatoes, tomatoes, and spinach.  Keep all follow-up visits as told by your health care provider. This is important. Contact a health care provider if:  You have abdominal pain that: ? Gets worse. ? Stays in one area (localizes).  You have a rash.  You have a stiff neck.  You are more irritable than usual.  You are sleepier or more difficult to wake up than usual.  You feel weak or dizzy.  You feel very thirsty.  You have urinated only a small amount of very dark urine over 6-8 hours. Get help right away if:  You have symptoms of severe dehydration.  You cannot drink fluids without vomiting.  Your symptoms get worse with treatment.  You have a fever.  You have a severe headache.  You have vomiting or diarrhea that: ? Gets worse. ? Does not go away.  You have blood or green matter  (bile) in your vomit.  You have blood in your stool. This may cause stool to look black and tarry.  You have not urinated in 6-8 hours.  You faint.  Your heart rate while sitting still is over 100 beats a minute.  You have trouble breathing. This information is not intended to replace advice given to you by your health care provider. Make sure you discuss any questions you have with your health care provider. Document Released: 09/09/2005 Document Revised: 04/05/2016 Document Reviewed: 11/03/2015 Elsevier Interactive Patient Education  2018 [employer]  

## 2017-06-11 NOTE — Progress Notes (Signed)
[facility] Progress Note   JAYLANIE BOSCHEE [id number] [birth year] 71 y.o.  EVALINE WALTMAN is managed by Dr. Nicholas Lose  Actively treated with chemotherapy: yes  Current Therapy: R-CHOP  Last Treated: 09 / 12 / 2018  Assessment/Plan:   Diarrhea, unspecified type  Weakness  Radiation pneumonitis ([place]) - Plan: predniSONE (DELTASONE) 10 MG tablet   Diarrhea: After talking with the patient appears that she is having more loose stools and true diarrhea. She is only taking Lomotil once in the morning. I have discussed with her that she can increase her Lomotil up to 4 times daily if needed. I did suggest that she might consider doing 2 doses for now. She outlined her fluid intake. From her description, she seems to be getting adequate fluid intake.  Weakness: The patient reports that she is trying to exercise but is only able to do this for a brief period of time due to weakness. I discussed with her that her weakness is likely associated with the radiation and chemotherapy that she has received. She has been encouraged to continue exercising as she is able.  Radiation pneumonitis. The patient continues to have shortness of breath and a nonproductive cough that is worsened over the past few weeks. A CT scan completed on 05/27/2017 showed groundglass opacities in her lungs which according to her report was believed to be likely radiation-induced. She has been given a prescription for a 16 day steroid taper. Dr. Lindi Adie will consider referring the patient for a repeat CT scan should she worsen or not improve after this treatment.  Please see After Visit Summary for patient specific instructions.  Future Appointments Date Time Provider [place]  06/24/2017 1:15 PM Deboraha Sprang, MD [facility]  06/26/2017 2:40 PM [facility] DEVICE REMOTES [facility]  10/28/2017 10:00 AM [facility] COUMADIN CLINIC [facility]    No  orders of the defined types were placed in this encounter.      Subjective:   Patient ID:  ALEXANDER MCAULEY is a 71 y.o. (DOB 1945/10/15) female.  Chief Complaint: No chief complaint on file.   HPI RHONDA LINAN is a 71 year old female with a history of a CD10 positive non-Hodgkin's B-cell lymphoma of germinal cell origin, who presents to the office today status post cycle 4 of R-CHOP dosed on 06/04/2017.  She was seen on 06/04/2017 prior to this cycle of chemotherapy at which time she that she was doing moderately well but did have a slight increase in shortness of breath and had found herself waking in the middle of the night to catch her breath. She had a nonproductive cough that worsened over the past few weeks. A CT scan completed on 05/27/2017 had showed groundglass opacities in her lungs which according to radiology were believed likely radiation induced. The CT scan also indicated that she had had a significant response to therapy of right paratracheal adenopathy/mass. Similar nonspecific pulmonary nodules were noted. She reports feeling some dizziness, weakness, and increased shortness of breath when getting up from sitting or sitting up from a prone position. She had 4-5 loose stools this morning but has only had to take 1 dose of Lomotil to control her symptoms. She denies that her bowel movements are watery, dark in color, or show evidence of blood. She states that her bowel movements are simply loose. She is only having loose stools in the morning. She continues to have good by mouth and put. She is eating well.  Her appetite may be decreased but she continues to have a good intake of food and fluids. She is urinating multiple times throughout the day and night due to her fluid intake. She denies dark or foul smelling urine and is having no dysuria.  Diarrhea:  Stanley Helmuth [place] reports diarrhea.  Onset of diarrhea was Immediately after beginning R CHOP chemotherapy.  Diarrhea  is occurring approximately 4 times per day.  Patient describes diarrhea as Loose.  Diarrhea has been associated with There have been no associated symptoms..   Patient denies blood in stool, fever, illness in household contacts, recent antibiotic use, recent travel, significant abdominal pain, unintentional weight loss.   Previous visits for diarrhea: She is discussed this with Dr. Lindi Adie.  Evaluation to date: CBC and CMET  Treatment to date: Lomotil, which has been effective  Cough and shortness of breath:  Tranesha Lessner Willette presents with a report of a cough and shortness of breath Symptoms include Cough and shortness of breath.  Onset of symptoms  Has been since completing radiation therapy and starting chemotherapy, gradually worsening since that time.  She also c/o cough described as nonproductive, shortness of breath and Generalized weakness since completing radiation therapy and starting chemotherapy.   She is drinking plenty of fluids. Evaluation to date: A CT scan completed on 05/27/2017 had showed groundglass opacities in her lungs which according to radiology were believed likely radiation induced.  Treatment to date: The patient was placed on a 16 day steroid taper today..          Medications: I have reviewed the patient's current medications.  Allergies:  Allergies  Allergen Reactions  . Tikosyn [Dofetilide] Other (See Comments)    Prolonged QT interval Prolonged QT interval  . Xarelto [Rivaroxaban] Other (See Comments)    Nose Bleed X 6 hrs ; packing in ER  . Epinephrine Other (See Comments) and Rash    Dental form only (liquid). Patient stated she will become "out of it" she can hear you but cannot respond in normal fashion.  Marland Kitchen Ketamine     Hallucinations  . Adhesive [Tape] Rash    Paper tape as well    Past Medical History:  Diagnosis Date  . Abnormally small mouth    per pt "I have a small mouth"  . Arthritis    osteoarthritis - knees   . Atrial  fibrillation [facility])    ablation- 2x's-- 1st time- [facility], 2nd event at [facility] in 2008. Convergent ablation at [facility] 6/14  . Breast cancer ([place])    Dr Margot Chimes, total thyroidectomy- 1999- for cancer  . Colon polyp    Dr Earlean Shawl  . Complication of anesthesia    Ketamine produces LSD reaction, bright colored nightmarish experience   . Dyslipidemia   . Dysrhythmia    a fib with internal/external ablation, currently NSR  . H/O pleural effusion    s/p thoracentesis w 3262m withdrawn  . Hepatitis    [place]hepatitis   . History of dysphagia    due to radiation therapy  . History of kidney stones   . Hx of thyroid cancer    Dr SForde Dandy . Hyperlipidemia   . Hypothyroidism   . Lung cancer, [place] lobe [place] 01[place]   radiation RX completed 03/04/17; will start chemo 6/27  . Morbid [place] ([place]    Status post lap band surgery  . Normal cardiac stress test    2008- cardiac stress/echo  .  Pacemaker-[employer]   . Pneumonia 2010   [facility]- during that event had to be cardioverted   . Renal calculi   . Right shoulder pain    needs shoulder replacement  . Sinus node dysfunction ([place])    Complicating convergent ablation 6/14  . Stroke [facility])    2003- [place] x2  . Wears glasses    readers    Past Surgical History:  Procedure Laterality Date  . ABDOMINAL HYSTERECTOMY  1983  . afib ablation     X 2; [facility] & Dr Caryl Comes  . APPENDECTOMY    . BONE MARROW BIOPSY  02/21/2017  . BREAST LUMPECTOMY  2010  . bso  1998  . CARDIOVERSION  10/09/2012   Procedure: CARDIOVERSION;  Surgeon: Minus Breeding, MD;  Location: [place];  Service: Cardiovascular;  Laterality: N/A;  . CARDIOVERSION  10/09/2012   Procedure: CARDIOVERSION;  Surgeon: Minus Breeding, MD;  Location: [facility] ENDOSCOPY;  Service: Cardiovascular;  Laterality: N/A;  [name] gave the ok to add pt to the add on , but we must check to find out if the can add pt on at [phone] ( [phone])  . CARDIOVERSION N/A 11/20/2012    Procedure: CARDIOVERSION;  Surgeon: Fay Records, MD;  Location: [facility] ENDOSCOPY;  Service: Cardiovascular;  Laterality: N/A;  . CHOLECYSTECTOMY    . COLONOSCOPY W/ POLYPECTOMY     Dr Earlean Shawl  . convergent    . CYSTOSCOPY N/A 02/06/2015   Procedure: CYSTOSCOPY;  Surgeon: Kathie Rhodes, MD;  Location: [facility];  Service: Urology;  Laterality: N/A;  . CYSTOSCOPY WITH RETROGRADE PYELOGRAM, URETEROSCOPY AND STENT PLACEMENT Right 02/06/2015   Procedure: RETROGRADE PYELOGRAM, RIGHT URETEROSCOPY STENT PLACEMENT;  Surgeon: Kathie Rhodes, MD;  Location: [facility];  Service: Urology;  Laterality: Right;  . CYSTOSCOPY WITH RETROGRADE PYELOGRAM, URETEROSCOPY AND STENT PLACEMENT Right 03/07/2017   Procedure: CYSTOSCOPY WITH RIGHT RETROGRADE PYELOGRAM,RIGHT  URETEROSCOPYLASER LITHOTRIPSY  AND STENT PLACEMENT AND STONE BASKETRY;  Surgeon: Kathie Rhodes, MD;  Location: [place];  Service: Urology;  Laterality: Right;  . EYE SURGERY     cataract surgery  . HOLMIUM LASER APPLICATION N/A 8/84/1660   Procedure: HOLMIUM LASER APPLICATION;  Surgeon: Kathie Rhodes, MD;  Location: [facility];  Service: Urology;  Laterality: N/A;  . HOLMIUM LASER APPLICATION Right 03/22/1600   Procedure: HOLMIUM LASER APPLICATION;  Surgeon: Kathie Rhodes, MD;  Location: [facility];  Service: Urology;  Laterality: Right;  . IR FLUORO GUIDE PORT INSERTION RIGHT  02/24/2017  . IR PATIENT EVAL TECH 0-60 MINS  03/11/2017  . IR US GUIDE VASC ACCESS RIGHT  02/24/2017  . JOINT REPLACEMENT  04/13/12   Right Knee replacement  . KNEE ARTHROSCOPY     bilateral  . LAPAROSCOPIC GASTRIC BANDING  07/10/2010  . LAPAROTOMY     for ruptured ovary and ovarian artery   . PACEMAKER INSERTION  03/10/13   [facility]  . POCKET REVISION N/A 12/08/2013   Procedure: POCKET REVISION;  Surgeon: Deboraha Sprang, MD;  Location: [facility] CATH LAB;  Service: Cardiovascular;  Laterality: N/A;  . THYROIDECTOMY  1998   Dr Margot Chimes  . TONSILLECTOMY    . TOTAL KNEE  ARTHROPLASTY  04/13/2012   Procedure: TOTAL KNEE ARTHROPLASTY;  Surgeon: Rudean Haskell, MD;  Location: [place];  Service: Orthopedics;  Laterality: Right;  . Transabdominal ablation  03/02/13   [facility]  . VIDEO BRONCHOSCOPY WITH ENDOBRONCHIAL ULTRASOUND N/A 02/07/2017   Procedure: VIDEO BRONCHOSCOPY WITH ENDOBRONCHIAL ULTRASOUND;  Surgeon: Marshell Garfinkel, MD;  [facility] OR;  Service: Pulmonary;  Laterality: N/A;    '@FAMHISTORY'$ @  Social History   Social History  . Marital status: Single    Spouse name: N/A  . Number of children: 0  . Years of education: N/A   Occupational History  . [employer] Retired   Social History Main Topics  . Smoking status: Never Smoker  . Smokeless tobacco: Never Used  . Alcohol use No  . Drug use: No  . Sexual activity: Not on file   Other Topics Concern  . Not on file   Social History Narrative   SINGLE    NO CHILDREN   NEVER SMOKED   EXERCISE 3 X WK          Past Medical History, Surgical history, Social history, and Family history were reviewed and updated as appropriate.   Please see review of systems for further details on the patient's review from today.   Review of Systems:  Review of Systems  Constitutional: Positive for activity change. Negative for appetite change, chills, diaphoresis, fever and unexpected weight change.  HENT: Negative for sinus pain, sinus pressure and trouble swallowing.   Respiratory: Positive for cough and shortness of breath. Negative for chest tightness and wheezing.   Cardiovascular: Negative for chest pain, palpitations and leg swelling.  Gastrointestinal: Positive for diarrhea. Negative for abdominal distention, abdominal pain, blood in stool, constipation, nausea and vomiting.  Neurological: Positive for weakness.    Objective:   Physical Exam:  There were no vitals taken for this visit. ECOG: 1  Physical Exam  Constitutional: No distress.  The patient is an  elderly female who appears to be in no apparent distress  HENT:  Head: Normocephalic.  Mouth/Throat: Oropharynx is clear and moist. No oropharyngeal exudate.  Neck: Normal range of motion. Neck supple.  Cardiovascular: Normal rate, regular rhythm and normal heart sounds.  Exam reveals no gallop and no friction rub.   No murmur heard. Pulmonary/Chest: Effort normal and breath sounds normal. No respiratory distress. She has no wheezes. She has no rales.  Abdominal: Soft. Bowel sounds are normal. She exhibits no distension. There is no tenderness. There is no rebound and no guarding.  Musculoskeletal: She exhibits no edema.  Lymphadenopathy:    She has no cervical adenopathy.  Neurological: She is alert.  Skin: Skin is warm and dry. No rash noted. She is not diaphoretic. No erythema.  Psychiatric: She has a normal mood and affect. Her behavior is normal. Judgment and thought content normal.    Lab Review:     Component Value Date/Time   NA 138 06/11/2017 1505   K 4.4 06/11/2017 1505   CL 106 02/08/2017 0233   CO2 26 06/11/2017 1505   GLUCOSE 128 06/11/2017 1505   BUN 20.1 06/11/2017 1505   CREATININE 0.9 06/11/2017 1505   CALCIUM 9.0 06/11/2017 1505   PROT 6.1 (L) 06/11/2017 1505   ALBUMIN 3.2 (L) 06/11/2017 1505   AST 10 06/11/2017 1505   ALT 15 06/11/2017 1505   ALKPHOS 95 06/11/2017 1505   BILITOT 0.64 06/11/2017 1505   GFRNONAA 54 (L) 02/08/2017 0233   GFRAA >60 02/08/2017 0233       Component Value Date/Time   WBC 1.5 (L) 06/11/2017 1505   WBC 4.8 02/21/2017 0944   RBC 2.93 (L) 06/11/2017 1505   RBC 4.38 02/21/2017 0944   HGB 9.3 (L) 06/11/2017 1505   HCT 28.8 (L) 06/11/2017 1505   PLT 126 (L)  06/11/2017 1505   MCV 98.3 06/11/2017 1505   MCH 31.7 06/11/2017 1505   MCH 29.0 02/21/2017 0944   MCHC 32.3 06/11/2017 1505   MCHC 32.6 02/21/2017 0944   RDW 20.3 (H) 06/11/2017 1505   LYMPHSABS 0.1 (L) 06/11/2017 1505   MONOABS 0.2 06/11/2017 1505   EOSABS 0.2  06/11/2017 1505   BASOSABS 0.0 06/11/2017 1505   -------------------------------  Imaging from last 24 hours (if applicable):  Radiology interpretation: Dg Chest 2 View  Result Date: 06/04/2017 CLINICAL DATA:  Mediastinal B-cell lymphoma with increase shortness of breath. EXAM: CHEST  2 VIEW COMPARISON:  Chest CT May 27, 2017, chest x-ray Feb 03, 2017 FINDINGS: The heart size and mediastinal contours are stable. Dual lead cardiac pacemaker is identified. Right central venous line is noted with distal tip in the superior vena cava. Post radiation changes identified in the right peritracheal and supra tracheal region stable compared to prior CT chest. There is no pulmonary edema or focal pneumonia. Degenerative joint changes of the spine are noted. IMPRESSION: Postradiation changes identified in the right para and supra trachea regions stable compared prior chest CT. No acute abnormality identified. Electronically Signed   By: Abelardo Diesel M.D.   On: 06/04/2017 15:50   Ct Chest W Contrast  Result Date: 05/27/2017 CLINICAL DATA:  Mediastinal/thymic non-Hodgkin's lymphoma, large B-cell diagnosed 5/18 with radiation therapy completed. Ongoing chemotherapy. Cough for 1 year. Thyroid cancer 18 years ago. Left breast cancer 7 years ago with lumpectomy and radiation therapy. EXAM: CT CHEST WITH CONTRAST TECHNIQUE: Multidetector CT imaging of the chest was performed during intravenous contrast administration. CONTRAST:  7m ISOVUE-300 IOPAMIDOL (ISOVUE-300) INJECTION 61% COMPARISON:  PET of 02/19/2017.  Chest CT 02/03/2017. FINDINGS: Cardiovascular: right-sided Port-A-Cath which terminates at the mid right atrium. Dual lead pacer. Tortuous thoracic aorta. Aortic atherosclerosis. Normal heart size, without pericardial effusion. Multivessel coronary artery atherosclerosis. No central pulmonary embolism, on this non-dedicated study. Mediastinum/Nodes: No supraclavicular adenopathy. Status post thyroidectomy.  No axillary adenopathy. Significant improvement in right paratracheal adenopathy. Residual nodal tissue measures maximally 2.2 x 2.5 cm on image 54/series 2. Compare 7.1 x 5.4 cm on the prior. No hilar adenopathy. Tiny hiatal hernia. Lungs/Pleura: No pleural fluid. Patchy medial right upper and less so right middle/right lower lobe airspace and ground-glass opacities which are likely radiation induced. These are new. Minimal medial superior segment left lower lobe airspace disease is also likely radiation induced. A 6 mm right upper lobe pulmonary nodule is similar on image 36/series 5. Left lower lobe 6 mm nodule is unchanged on image 90/series 5. Upper Abdomen: Left hepatic lobe cyst. Status post lap band procedure. Old granulomatous disease in the spleen. Normal imaged portions of the pancreas, adrenal glands, right kidney. Upper pole left renal collecting system stone. Cholecystectomy. Musculoskeletal: No acute osseous abnormality. IMPRESSION: 1. Significant response to therapy of right paratracheal adenopathy/mass. 2. Right greater than left ground-glass and airspace opacities are likely radiation induced. 3. Coronary artery atherosclerosis. Aortic Atherosclerosis (ICD10-I70.0). 4.  Tiny hiatal hernia. 5. Similar nonspecific pulmonary nodules. Electronically Signed   By: KAbigail MiyamotoM.D.   On: 05/27/2017 16:31

## 2017-06-11 NOTE — Telephone Encounter (Addendum)
Pt calling in distress. Pt states that she had been severely fatigue over the last 2-3 days. She is 1 week post  rchop and feels that she is more fatigue than the last few cycles. Pt states that she has diarrhea everyday, but have had more diarrhea today (5+ episode) She has anti diarrhea meds but unsure if she has taken any today. Pt denies fevers, chills, n/v. She states that she drinks fluids all day and has no issues with eating. Her blood pressure had been on the lower side lately, around low 100's/50's and HR in the 80-90's. She is also unable to ambulate on her own d/t to profound weakness and dizziness. Pt would like to come in to get checked out today. Pt is scheduled to have an echo at [facility] today around 2pm. Discussed with Symptom management, Alfredia Client and okay to bring in pt for stat labs and possible IVF's. Checked with infusion charge nurse and unable to get pt back in for IVF's until 330pm this afternoon. Pt verbalized understanding. Will send message to scheduling to get pt in for lab/symptom management and IVF's for this afternoon.

## 2017-06-12 ENCOUNTER — Ambulatory Visit: Payer: Medicare Other

## 2017-06-12 ENCOUNTER — Encounter: Payer: Medicare Other | Admitting: Medical

## 2017-06-13 ENCOUNTER — Telehealth: Payer: Self-pay | Admitting: Hematology and Oncology

## 2017-06-13 ENCOUNTER — Ambulatory Visit: Payer: Medicare Other | Admitting: Physician Assistant

## 2017-06-13 NOTE — Telephone Encounter (Signed)
Scheduled her appts per 9/21 sch msg. Patient aware of times.

## 2017-06-17 ENCOUNTER — Encounter: Payer: Self-pay | Admitting: Hematology and Oncology

## 2017-06-17 DIAGNOSIS — R7301 Impaired fasting glucose: Secondary | ICD-10-CM | POA: Diagnosis not present

## 2017-06-17 DIAGNOSIS — E890 Postprocedural hypothyroidism: Secondary | ICD-10-CM | POA: Diagnosis not present

## 2017-06-17 DIAGNOSIS — C884 Extranodal marginal zone B-cell lymphoma of mucosa-associated lymphoid tissue [MALT-lymphoma]: Secondary | ICD-10-CM | POA: Diagnosis not present

## 2017-06-17 DIAGNOSIS — Z7901 Long term (current) use of anticoagulants: Secondary | ICD-10-CM | POA: Diagnosis not present

## 2017-06-17 DIAGNOSIS — I480 Paroxysmal atrial fibrillation: Secondary | ICD-10-CM | POA: Diagnosis not present

## 2017-06-17 DIAGNOSIS — Z1389 Encounter for screening for other disorder: Secondary | ICD-10-CM | POA: Diagnosis not present

## 2017-06-17 DIAGNOSIS — I871 Compression of vein: Secondary | ICD-10-CM | POA: Diagnosis not present

## 2017-06-17 DIAGNOSIS — Z6835 Body mass index (BMI) 35.0-35.9, adult: Secondary | ICD-10-CM | POA: Diagnosis not present

## 2017-06-17 DIAGNOSIS — Z23 Encounter for immunization: Secondary | ICD-10-CM | POA: Diagnosis not present

## 2017-06-20 ENCOUNTER — Ambulatory Visit (INDEPENDENT_AMBULATORY_CARE_PROVIDER_SITE_OTHER): Payer: Medicare Other | Admitting: Internal Medicine

## 2017-06-20 ENCOUNTER — Encounter: Payer: Self-pay | Admitting: Internal Medicine

## 2017-06-20 VITALS — BP 126/74 | HR 80 | Ht 66.0 in | Wt 220.0 lb

## 2017-06-20 DIAGNOSIS — I495 Sick sinus syndrome: Secondary | ICD-10-CM | POA: Diagnosis not present

## 2017-06-20 DIAGNOSIS — Z950 Presence of cardiac pacemaker: Secondary | ICD-10-CM | POA: Diagnosis not present

## 2017-06-20 DIAGNOSIS — I6350 Cerebral infarction due to unspecified occlusion or stenosis of unspecified cerebral artery: Secondary | ICD-10-CM

## 2017-06-20 DIAGNOSIS — I480 Paroxysmal atrial fibrillation: Secondary | ICD-10-CM | POA: Diagnosis not present

## 2017-06-20 LAB — CUP PACEART INCLINIC DEVICE CHECK
Battery Remaining Longevity: 86 mo
Battery Voltage: 3.01 V
Brady Statistic AP VP Percent: 0 %
Brady Statistic AS VS Percent: 99.96 %
Date Time Interrogation Session: 20180928150423
Implantable Lead Implant Date: 20140618
Implantable Lead Location: 753859
Implantable Pulse Generator Implant Date: 20140618
Lead Channel Impedance Value: 475 Ohm
Lead Channel Impedance Value: 988 Ohm
Lead Channel Pacing Threshold Amplitude: 0.5 V
Lead Channel Pacing Threshold Amplitude: 0.875 V
Lead Channel Pacing Threshold Pulse Width: 0.4 ms
Lead Channel Pacing Threshold Pulse Width: 0.4 ms
Lead Channel Sensing Intrinsic Amplitude: 0.25 mV
Lead Channel Sensing Intrinsic Amplitude: 1 mV
Lead Channel Sensing Intrinsic Amplitude: 12.25 mV
Lead Channel Setting Pacing Amplitude: 2 V
Lead Channel Setting Pacing Amplitude: 2.5 V
MDC IDC LEAD IMPLANT DT: 20140618
MDC IDC LEAD LOCATION: 753860
MDC IDC MSMT LEADCHNL RA IMPEDANCE VALUE: 399 Ohm
MDC IDC MSMT LEADCHNL RV IMPEDANCE VALUE: 912 Ohm
MDC IDC MSMT LEADCHNL RV SENSING INTR AMPL: 10 mV
MDC IDC SET LEADCHNL RV PACING PULSEWIDTH: 0.4 ms
MDC IDC SET LEADCHNL RV SENSING SENSITIVITY: 0.9 mV
MDC IDC STAT BRADY AP VS PERCENT: 0.01 %
MDC IDC STAT BRADY AS VP PERCENT: 0.03 %
MDC IDC STAT BRADY RA PERCENT PACED: 0.01 %
MDC IDC STAT BRADY RV PERCENT PACED: 0.03 %

## 2017-06-20 NOTE — Progress Notes (Signed)
Patient Care Team: [name], MD as PCP - General (Endocrinology)   HPI  Kayla Harrison is a 71 y.o. female Seen in followup for a pacemaker implanted at [facility] and atrial fibrillation.  She underwent pulmonary vein isolation at [facility] and had recurrent atrial fibrillation and underwent convergent ablation at [facility] 1/02; it was complicated by heart block prompting pacemaker implantation.   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 She was not tolerated  NOACs and remains on warfarin  She has had progressive shortness of breath. This is with modest exertion. She is able to lie flat. She has no peripheral edema.          Date          TSH      ALT Hgb     8/18           0.2       15 11.9     9/18                     9.3   WBC 1.5   Echo 2011 EF 55% with mod LAE-68m  Echo 7/14 normal EF Echo 5/18 normal EF Echo 9/18 normal LV function  mitral annular  Calcification unchanged from 5/18   Chest x-ray Personally reviewed  infiltrates described as post radiation changes   She also has a history of morbid obesity status post lap band surgery    Past Medical History:  Diagnosis Date  . Abnormally small mouth    per pt "I have a small mouth"  . Arthritis    osteoarthritis - knees   . Atrial fibrillation ([facility]    ablation- 2x's-- [facility]  . [place] cancer [place]    Dr SMargot Chimes total thyroidectomy- 1999- for cancer  . Colon polyp    Dr MEarlean Shawl . Complication of anesthesia    Ketamine produces LSD reaction, bright colored nightmarish experience   . Dyslipidemia   . Dysrhythmia    a fib with internal/external  ablation, currently NSR  . H/O pleural effusion    s/p thoracentesis w [id number]  . Hepatitis    [place]hepatitis   . History of dysphagia    due to radiation therapy  . History of kidney stones   . Hx of thyroid cancer    Dr SoForde Dandy. Hyperlipidemia   . Hypothyroidism   . [place]   radiation RX completed 03/04/17; will start chemo 6/27  . Morbid obesity ([place]   Status post lap band surgery  . Normal cardiac stress test    2008- cardiac stress/echo  . Pacemaker-Medtronic   . [facility]   . Renal calculi   . Right shoulder pain    needs shoulder replacement  . [place]us [place]   Complicating convergent ablation 6/14  . [facility]   [place]  . Wears glasses  readers    Past Surgical History:  Procedure Laterality Date  . ABDOMINAL HYSTERECTOMY  1983  . afib ablation     X 2; [facility] & Dr Caryl Comes  . APPENDECTOMY    . BONE MARROW BIOPSY  02/21/2017  . BREAST LUMPECTOMY  2010  . bso  1998  . CARDIOVERSION  10/09/2012   Procedure: CARDIOVERSION;  Surgeon: Minus Breeding, MD;  Location: [place];  Service: Cardiovascular;  Laterality: N/A;  . CARDIOVERSION  10/09/2012   Procedure: CARDIOVERSION;  Surgeon: Minus Breeding, MD;  Location[facility] ENDOSCOPY;  Service: Cardiovascular;  Laterality: N/A;  [name] gave the ok to add pt to the add on , but we must check to find out if the can add pt on at [phone] ( [phone])  . CARDIOVERSION N/A 11/20/2012   Procedure: CARDIOVERSION;  Surgeon: Fay Records, MD;  [facility];  Service: Cardiovascular;  Laterality: N/A;  . CHOLECYSTECTOMY    . COLONOSCOPY W/ POLYPECTOMY     Dr Earlean Shawl  . convergent    . CYSTOSCOPY N/A 02/06/2015   Procedure: CYSTOSCOPY;  Surgeon: Kathie Rhodes, MD;  [facility];  Service: Urology;  Laterality: N/A;  . CYSTOSCOPY WITH RETROGRADE PYELOGRAM, URETEROSCOPY AND  STENT PLACEMENT Right 02/06/2015   Procedure: RETROGRADE PYELOGRAM, RIGHT URETEROSCOPY STENT PLACEMENT;  Surgeon: Kathie Rhodes, MD;  Location[facility];  Service: Urology;  Laterality: Right;  . CYSTOSCOPY WITH RETROGRADE PYELOGRAM, URETEROSCOPY AND STENT PLACEMENT Right 03/07/2017   Procedure: CYSTOSCOPY WITH RIGHT RETROGRADE PYELOGRAM,RIGHT  URETEROSCOPYLASER LITHOTRIPSY  AND STENT PLACEMENT AND STONE BASKETRY;  Surgeon: Kathie Rhodes, MD;  Location: [place];  Service: Urology;  Laterality: Right;  . EYE SURGERY     cataract surgery  . HOLMIUM LASER APPLICATION N/A 3/66/4403   Procedure: HOLMIUM LASER APPLICATION;  Surgeon: Kathie Rhodes, MD;  Location: [facility];  Service: Urology;  Laterality: N/A;  . HOLMIUM LASER APPLICATION Right 4/74/2595   Procedure: HOLMIUM LASER APPLICATION;  Surgeon: Kathie Rhodes, MD;  Location: [facility];  Service: Urology;  Laterality: Right;  . IR FLUORO GUIDE PORT INSERTION RIGHT  02/24/2017  . IR PATIENT EVAL TECH 0-60 MINS  03/11/2017  . IR US GUIDE VASC ACCESS RIGHT  02/24/2017  . JOINT REPLACEMENT  04/13/12   Right Knee replacement  . KNEE ARTHROSCOPY     bilateral  . LAPAROSCOPIC GASTRIC BANDING  07/10/2010  . LAPAROTOMY     for ruptured ovary and ovarian artery   . PACEMAKER INSERTION  03/10/13   [facility]  . POCKET REVISION N/A 12/08/2013   Procedure: POCKET REVISION;  Surgeon: Deboraha Sprang, MD;  Location: [facility] CATH LAB;  Service: Cardiovascular;  Laterality: N/A;  . THYROIDECTOMY  1998   Dr Margot Chimes  . TONSILLECTOMY    . TOTAL KNEE ARTHROPLASTY  04/13/2012   Procedure: TOTAL KNEE ARTHROPLASTY;  Surgeon: Rudean Haskell, MD;  Location: [place];  Service: Orthopedics;  Laterality: Right;  . Transabdominal ablation  03/02/13   [facility]  . VIDEO BRONCHOSCOPY WITH ENDOBRONCHIAL ULTRASOUND N/A 02/07/2017   Procedure: VIDEO BRONCHOSCOPY WITH ENDOBRONCHIAL ULTRASOUND;  Surgeon: Marshell Garfinkel, MD;  Location: [place];  Service: Pulmonary;   Laterality: N/A;    Current Outpatient Prescriptions  Medication Sig Dispense Refill  . acetaminophen (TYLENOL 8 HOUR) 650 MG CR tablet Take 1 tablet (650 mg total) by mouth every 8 (eight) hours as needed for pain. 30 tablet 0  . allopurinol (ZYLOPRIM) 300 MG tablet Take 1 tablet (300 mg total)  by mouth daily. 30 tablet 3  . amiodarone (PACERONE) 200 MG tablet Take 1 tablet (200 mg total) by mouth daily. (Patient taking differently: Take 400 mg by mouth daily. ) 90 tablet 3  . apixaban (ELIQUIS) 5 MG TABS tablet Take 1 tablet (5 mg total) by mouth 2 (two) times daily. 180 tablet 1  . cycloSPORINE (RESTASIS) 0.05 % ophthalmic emulsion Place 1 drop into both eyes 2 (two) times daily.    Marland Kitchen dicyclomine (BENTYL) 20 MG tablet Take 1 tablet (20 mg total) by mouth every 6 (six) hours. As needed 60 tablet 1  . diphenhydramine-acetaminophen (TYLENOL PM) 25-500 MG TABS tablet Take 1 tablet by mouth at bedtime as needed.    . diphenoxylate-atropine (LOMOTIL) 2.5-0.025 MG tablet TAKE 2 TABLETS BY MOUTH TWICE DAILY AS NEEDED FOR DIARRHEA OR LOOSE STOOL 30 tablet 0  . furosemide (LASIX) 40 MG tablet Take 40 mg by mouth daily as needed for fluid.     Marland Kitchen levothyroxine (SYNTHROID, LEVOTHROID) 175 MCG tablet Take 175 mcg by mouth daily before breakfast.    . lidocaine-prilocaine (EMLA) cream Apply to affected area once 30 g 3  . Lifitegrast (XIIDRA) 5 % SOLN Apply 1 drop to eye 2 (two) times daily.    Marland Kitchen LORazepam (ATIVAN) 0.5 MG tablet Take 1 tablet (0.5 mg total) by mouth at bedtime. 30 tablet 0  . Multiple Vitamin (MULTIVITAMIN) tablet Take 1 tablet by mouth daily.     . ondansetron (ZOFRAN) 8 MG tablet Take 1 tablet (8 mg total) by mouth 2 (two) times daily as needed for refractory nausea / vomiting. Start on day 3 after cyclophosphamide chemotherapy. 30 tablet 1  . Oxycodone HCl 10 MG TABS Take 1 tablet (10 mg total) by mouth every 4 (four) hours as needed. 20 tablet 0  . predniSONE (DELTASONE) 10 MG tablet 30  mg QD x 4 days, 20 mg QD x 4 days, 10 mg x 4 days, 5 mg QD x 5 days, then stop 26 tablet 0  . predniSONE (DELTASONE) 20 MG tablet Take 3 tablets (60 mg total) by mouth daily. Take on days 1-5 of chemotherapy. 30 tablet 3  . sucralfate (CARAFATE) 1 g tablet Take 1 tablet (1 g total) by mouth 4 (four) times daily. 120 tablet 2   No current facility-administered medications for this visit.     Allergies  Allergen Reactions  . Tikosyn [Dofetilide] Other (See Comments)    Prolonged QT interval Prolonged QT interval  . Xarelto [Rivaroxaban] Other (See Comments)    Nose Bleed X 6 hrs ; packing in ER  . Epinephrine Other (See Comments) and Rash    Dental form only (liquid). Patient stated she will become "out of it" she can hear you but cannot respond in normal fashion.  Marland Kitchen Ketamine     Hallucinations  . Adhesive [Tape] Rash    Paper tape as well    Review of Systems negative except from HPI and PMH  Physical Exam BP 126/74   Pulse 80   Ht _0  (1.676 m)   Wt 220 lb (99.8 kg)   SpO2 95%   BMI 35.51 kg/m  Well developed and nourished Moderate shortness of breath and discomfort sitting in wheelchair  HENT normal Neck supple with JVP-8-10 Chest wall engorgement but no arm vein engorgementts Clear RRR  Abd-soft with active BS without hepatomegaly No Clubbing cyanosis  No edema Skin-warm and dry A & Oriented  Grossly normal sensory and motor function  ECG was ordered today sinus 78 intervals 18/09/42   Assessment and  Plan  Atrial fibrillation  S/p convergent ablation paroxysmal  Prior Strokes  Pacemaker Medtronic MRI compatible   Labile INR  Non-Hodgkin's lymphoma on chemotherapy  Dyspnea on exertion     Treated hypothyroidism  High Risk Medication Surveillance   Her shortness of breath worsening in the absence of evidence of volume overload and with her albumin been relatively normal raises for me the concern that this could be amiodarone lung injury. Differential  is broad. Her chest x-ray is notably abnormal. I don't think she has congestive heart failure. I will be in touch with her oncologist to see whether they think pulmonary input would be valuable  Ambulation-sustained O2 sats greater than 93  In the interim we will discontinue her amiodarone  More than 50% of 40 min was spent in counseling related to the above

## 2017-06-20 NOTE — Patient Instructions (Signed)
Medication Instructions: - Your physician has recommended you make the following change in your medication:  1) STOP amiodarone  Labwork: - none ordered  Procedures/Testing: - none ordered  Follow-Up: - Your physician recommends that you schedule a follow-up appointment in: 3 months with Dr. Caryl Comes.   Any Additional Special Instructions Will Be Listed Below (If Applicable).     If you need a refill on your cardiac medications before your next appointment, please call your pharmacy.

## 2017-06-24 ENCOUNTER — Encounter: Payer: Medicare Other | Admitting: Internal Medicine

## 2017-06-25 ENCOUNTER — Ambulatory Visit: Payer: Medicare Other

## 2017-06-25 ENCOUNTER — Other Ambulatory Visit (HOSPITAL_BASED_OUTPATIENT_CLINIC_OR_DEPARTMENT_OTHER): Payer: Medicare Other

## 2017-06-25 ENCOUNTER — Ambulatory Visit (HOSPITAL_BASED_OUTPATIENT_CLINIC_OR_DEPARTMENT_OTHER): Payer: Medicare Other

## 2017-06-25 ENCOUNTER — Ambulatory Visit (HOSPITAL_BASED_OUTPATIENT_CLINIC_OR_DEPARTMENT_OTHER): Payer: Medicare Other | Admitting: Hematology and Oncology

## 2017-06-25 ENCOUNTER — Telehealth: Payer: Self-pay | Admitting: Hematology and Oncology

## 2017-06-25 VITALS — BP 126/80 | HR 79 | Temp 98.5°F | Resp 18

## 2017-06-25 DIAGNOSIS — Z5111 Encounter for antineoplastic chemotherapy: Secondary | ICD-10-CM | POA: Diagnosis not present

## 2017-06-25 DIAGNOSIS — Z5112 Encounter for antineoplastic immunotherapy: Secondary | ICD-10-CM | POA: Diagnosis not present

## 2017-06-25 DIAGNOSIS — E860 Dehydration: Secondary | ICD-10-CM

## 2017-06-25 DIAGNOSIS — C383 Malignant neoplasm of mediastinum, part unspecified: Secondary | ICD-10-CM

## 2017-06-25 DIAGNOSIS — G620 Drug-induced polyneuropathy: Secondary | ICD-10-CM

## 2017-06-25 DIAGNOSIS — K521 Toxic gastroenteritis and colitis: Secondary | ICD-10-CM | POA: Diagnosis not present

## 2017-06-25 DIAGNOSIS — C8522 Mediastinal (thymic) large B-cell lymphoma, intrathoracic lymph nodes: Secondary | ICD-10-CM

## 2017-06-25 DIAGNOSIS — R530 Neoplastic (malignant) related fatigue: Secondary | ICD-10-CM

## 2017-06-25 DIAGNOSIS — R0602 Shortness of breath: Secondary | ICD-10-CM | POA: Diagnosis not present

## 2017-06-25 DIAGNOSIS — Z5189 Encounter for other specified aftercare: Secondary | ICD-10-CM

## 2017-06-25 LAB — COMPREHENSIVE METABOLIC PANEL
ALT: 22 U/L (ref 0–55)
AST: 20 U/L (ref 5–34)
Albumin: 3.2 g/dL — ABNORMAL LOW (ref 3.5–5.0)
Alkaline Phosphatase: 76 U/L (ref 40–150)
Anion Gap: 10 mEq/L (ref 3–11)
BUN: 20.3 mg/dL (ref 7.0–26.0)
CO2: 24 mEq/L (ref 22–29)
Calcium: 9.3 mg/dL (ref 8.4–10.4)
Chloride: 108 mEq/L (ref 98–109)
Creatinine: 1 mg/dL (ref 0.6–1.1)
EGFR: 54 mL/min/{1.73_m2} — ABNORMAL LOW (ref >90)
Glucose: 128 mg/dl (ref 70–140)
Potassium: 4 mEq/L (ref 3.5–5.1)
Sodium: 141 mEq/L (ref 136–145)
Total Bilirubin: 0.46 mg/dL (ref 0.20–1.20)
Total Protein: 6.3 g/dL — ABNORMAL LOW (ref 6.4–8.3)

## 2017-06-25 LAB — CBC WITH DIFFERENTIAL/PLATELET
BASO%: 2.3 % — ABNORMAL HIGH (ref 0.0–2.0)
Basophils Absolute: 0.2 10*3/uL — ABNORMAL HIGH (ref 0.0–0.1)
EOS%: 2.4 % (ref 0.0–7.0)
Eosinophils Absolute: 0.2 10*3/uL (ref 0.0–0.5)
HCT: 32.8 % — ABNORMAL LOW (ref 34.8–46.6)
HGB: 10.8 g/dL — ABNORMAL LOW (ref 11.6–15.9)
LYMPH%: 2 % — ABNORMAL LOW (ref 14.0–49.7)
MCH: 33.1 pg (ref 25.1–34.0)
MCHC: 32.8 g/dL (ref 31.5–36.0)
MCV: 100.9 fL (ref 79.5–101.0)
MONO#: 1.4 10*3/uL — ABNORMAL HIGH (ref 0.1–0.9)
MONO%: 13.4 % (ref 0.0–14.0)
NEUT#: 8.3 10*3/uL — ABNORMAL HIGH (ref 1.5–6.5)
NEUT%: 79.9 % — ABNORMAL HIGH (ref 38.4–76.8)
Platelets: 215 10*3/uL (ref 145–400)
RBC: 3.25 10*6/uL — ABNORMAL LOW (ref 3.70–5.45)
RDW: 20.4 % — ABNORMAL HIGH (ref 11.2–14.5)
WBC: 10.4 10*3/uL — ABNORMAL HIGH (ref 3.9–10.3)
lymph#: 0.2 10*3/uL — ABNORMAL LOW (ref 0.9–3.3)

## 2017-06-25 MED ORDER — SODIUM CHLORIDE 0.9 % IV SOLN
375.0000 mg/m2 | Freq: Once | INTRAVENOUS | Status: AC
  Administered 2017-06-25: 800 mg via INTRAVENOUS
  Filled 2017-06-25: qty 30

## 2017-06-25 MED ORDER — ACETAMINOPHEN 325 MG PO TABS
ORAL_TABLET | ORAL | Status: AC
  Filled 2017-06-25: qty 2

## 2017-06-25 MED ORDER — DIPHENHYDRAMINE HCL 25 MG PO CAPS
50.0000 mg | ORAL_CAPSULE | Freq: Once | ORAL | Status: AC
  Administered 2017-06-25: 50 mg via ORAL

## 2017-06-25 MED ORDER — DOXORUBICIN HCL CHEMO IV INJECTION 2 MG/ML
40.0000 mg/m2 | Freq: Once | INTRAVENOUS | Status: AC
  Administered 2017-06-25: 88 mg via INTRAVENOUS
  Filled 2017-06-25: qty 44

## 2017-06-25 MED ORDER — SODIUM CHLORIDE 0.9% FLUSH
10.0000 mL | INTRAVENOUS | Status: DC | PRN
  Administered 2017-06-25: 10 mL
  Filled 2017-06-25: qty 10

## 2017-06-25 MED ORDER — PALONOSETRON HCL INJECTION 0.25 MG/5ML
INTRAVENOUS | Status: AC
  Filled 2017-06-25: qty 5

## 2017-06-25 MED ORDER — DEXAMETHASONE SODIUM PHOSPHATE 10 MG/ML IJ SOLN
INTRAMUSCULAR | Status: AC
  Filled 2017-06-25: qty 1

## 2017-06-25 MED ORDER — HEPARIN SOD (PORK) LOCK FLUSH 100 UNIT/ML IV SOLN
500.0000 [IU] | Freq: Once | INTRAVENOUS | Status: AC | PRN
  Administered 2017-06-25: 500 [IU]
  Filled 2017-06-25: qty 5

## 2017-06-25 MED ORDER — ACETAMINOPHEN 325 MG PO TABS
650.0000 mg | ORAL_TABLET | Freq: Once | ORAL | Status: AC
  Administered 2017-06-25: 650 mg via ORAL

## 2017-06-25 MED ORDER — DEXAMETHASONE SODIUM PHOSPHATE 10 MG/ML IJ SOLN: 10.0000 mg | Freq: Once | INTRAMUSCULAR | Status: DC

## 2017-06-25 MED ORDER — DIPHENHYDRAMINE HCL 25 MG PO CAPS
ORAL_CAPSULE | ORAL | Status: AC
  Filled 2017-06-25: qty 2

## 2017-06-25 MED ORDER — PEGFILGRASTIM 6 MG/0.6ML ~~LOC~~ PSKT
6.0000 mg | PREFILLED_SYRINGE | Freq: Once | SUBCUTANEOUS | Status: AC
  Administered 2017-06-25: 6 mg via SUBCUTANEOUS
  Filled 2017-06-25: qty 0.6

## 2017-06-25 MED ORDER — PALONOSETRON HCL INJECTION 0.25 MG/5ML
0.2500 mg | Freq: Once | INTRAVENOUS | Status: AC
  Administered 2017-06-25: 0.25 mg via INTRAVENOUS

## 2017-06-25 MED ORDER — SODIUM CHLORIDE 0.9% FLUSH
10.0000 mL | Freq: Once | INTRAVENOUS | Status: AC
  Administered 2017-06-25: 10 mL
  Filled 2017-06-25: qty 10

## 2017-06-25 MED ORDER — SODIUM CHLORIDE 0.9 % IV SOLN
Freq: Once | INTRAVENOUS | Status: AC
  Administered 2017-06-25: 11:00:00 via INTRAVENOUS

## 2017-06-25 MED ORDER — SODIUM CHLORIDE 0.9 % IV SOLN
600.0000 mg/m2 | Freq: Once | INTRAVENOUS | Status: AC
  Administered 2017-06-25: 1320 mg via INTRAVENOUS
  Filled 2017-06-25: qty 66

## 2017-06-25 NOTE — Patient Instructions (Signed)
Implanted Port Home Guide An implanted port is a type of central line that is placed under the skin. Central lines are used to provide IV access when treatment or nutrition needs to be given through a person's veins. Implanted ports are used for long-term IV access. An implanted port may be placed because:  You need IV medicine that would be irritating to the small veins in your hands or arms.  You need long-term IV medicines, such as antibiotics.  You need IV nutrition for a long period.  You need frequent blood draws for lab tests.  You need dialysis.  Implanted ports are usually placed in the chest area, but they can also be placed in the upper arm, the abdomen, or the leg. An implanted port has two main parts:  Reservoir. The reservoir is round and will appear as a small, raised area under your skin. The reservoir is the part where a needle is inserted to give medicines or draw blood.  Catheter. The catheter is a thin, flexible tube that extends from the reservoir. The catheter is placed into a large vein. Medicine that is inserted into the reservoir goes into the catheter and then into the vein.  How will I care for my incision site? Do not get the incision site wet. Bathe or shower as directed by your health care provider. How is my port accessed? Special steps must be taken to access the port:  Before the port is accessed, a numbing cream can be placed on the skin. This helps numb the skin over the port site.  Your health care provider uses a sterile technique to access the port. ? Your health care provider must put on a mask and sterile gloves. ? The skin over your port is cleaned carefully with an antiseptic and allowed to dry. ? The port is gently pinched between sterile gloves, and a needle is inserted into the port.  Only "non-coring" port needles should be used to access the port. Once the port is accessed, a blood return should be checked. This helps ensure that the port  is in the vein and is not clogged.  If your port needs to remain accessed for a constant infusion, a clear (transparent) bandage will be placed over the needle site. The bandage and needle will need to be changed every week, or as directed by your health care provider.  Keep the bandage covering the needle clean and dry. Do not get it wet. Follow your health care provider's instructions on how to take a shower or bath while the port is accessed.  If your port does not need to stay accessed, no bandage is needed over the port.  What is flushing? Flushing helps keep the port from getting clogged. Follow your health care provider's instructions on how and when to flush the port. Ports are usually flushed with saline solution or a medicine called heparin. The need for flushing will depend on how the port is used.  If the port is used for intermittent medicines or blood draws, the port will need to be flushed: ? After medicines have been given. ? After blood has been drawn. ? As part of routine maintenance.  If a constant infusion is running, the port may not need to be flushed.  How long will my port stay implanted? The port can stay in for as long as your health care provider thinks it is needed. When it is time for the port to come out, surgery will be   done to remove it. The procedure is similar to the one performed when the port was put in. When should I seek immediate medical care? When you have an implanted port, you should seek immediate medical care if:  You notice a bad smell coming from the incision site.  You have swelling, redness, or drainage at the incision site.  You have more swelling or pain at the port site or the surrounding area.  You have a fever that is not controlled with medicine.  This information is not intended to replace advice given to you by your health care provider. Make sure you discuss any questions you have with your health care provider. Document  Released: 09/09/2005 Document Revised: 02/15/2016 Document Reviewed: 05/17/2013 Elsevier Interactive Patient Education  2017 Elsevier Inc.  

## 2017-06-25 NOTE — Telephone Encounter (Signed)
Gave patient avs and calendar per 10/3 los

## 2017-06-25 NOTE — Assessment & Plan Note (Signed)
02/03/2017: Large superior mediastinal mass contiguous with right hilar region encasing the trachea and right mainstem bronchus measuring 5.4 x 7.5 x 6 cm causing SVC obstruction, additional mediastinal lymph nodes subcarinal 1.6 cm and left hilar 1 cm; multiple small pulmonary nodules 4-6 mm   Bronchoscopy and biopsy 02/09/2017: Monoclonal B-cell population expressing CD10 consistent with non-Hodgkin's B-cell lymphoma germinal center origin Bone marrow biopsy 02/21/2017: No evidence of lymphoma PET/CT scan 79/15/0413: Hypermetabolic right paratracheal nodal mass ----------------------------------------------------------------------- Current treatment: Cycle 5 day 1 R-CHOP Chemotherapy toxicities: 1Severe profound diarrhea: In spite of Lomotil and Imodium, she is alternating these and this is manageable. 2. Fatigue and headaches,patient attributes this to dexamethasone. It has improved since dexamethasone was decreased. 3. Dehydration: Patient does fit the right fluids has not helped her. 4. Shortness of breath: hard to tell what the etiology is.  Patient has seen Dr. Caryl Comes with cardiology who stopped her amiodarone. Her respiratory symptoms have improved slightly. 5. 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 We will discontinue when questioned as of today.  Return to clinic in 3 weeks for cycle 6

## 2017-06-25 NOTE — Progress Notes (Signed)
Patient Care Team: [name], MD as PCP - General (Endocrinology)  DIAGNOSIS:  Encounter Diagnosis  Name Primary?  . 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)     SUMMARY OF ONCOLOGIC HISTORY:   Non-Hodgkin lymphoma, unspecified, intrathoracic lymph nodes ([place])   02/03/2017 Imaging    Large superior mediastinal mass contiguous with right hilar region encasing the trachea and right mainstem bronchus measuring 5.4 x 7.5 x 6 cm causing SVC obstruction, additional mediastinal lymph nodes subcarinal 1.6 cm and left hilar 1 cm; multiple small pulmonary nodules 4-6 mm       02/11/2017 Initial Diagnosis    Flow cytometry of mediastinal mass revealed monoclonal population of B cells with expression of CD10 consistent with non-Hodgkin B-cell lymphoma germinal center operation; bone marrow biopsy negative for lymphoma      04/02/2017 -  Chemotherapy    R-CHOP X 6 cycles        CHIEF COMPLIANT: Cycle 5R CHOP  INTERVAL HISTORY: Kayla PENDELTON is a 71 year old with above-mentioned history of diffuse large B cell lymphoma currently on R CHOP chemotherapy and today is cycle 5 of treatment. After 3 cycles she had remarkable improvement in the tumor size. Over the past several weeks her biggest complaint has been related to shortness of breath with minimal exertion and palpitations. She saw Dr. Caryl Comes who stopped her amiodarone.  She was also noted to have radiation pneumonitis and was prescribed steroids which she had taken and is on a tapering dose. She feels fatigued as a result of chemotherapy. Her biggest complaint is lower extremity weakness and pain in the legs.  REVIEW OF SYSTEMS:   Constitutional: Denies fevers, chills or abnormal weight loss Eyes: Denies blurriness of vision Ears, nose, mouth, throat, and face: Denies mucositis or sore throat Respiratory: shortness of breath and tachycardia to minimal exertion Cardiovascular: Denies  palpitation, chest discomfort Gastrointestinal:  Denies nausea, heartburn or change in bowel habits Skin: Denies abnormal skin rashes Lymphatics: Denies new lymphadenopathy or easy bruising Neurological:Denies numbness, tingling or new weaknesses Behavioral/Psych: Mood is stable, no new changes  Extremities: lower extremity weakness and pain  All other systems were reviewed with the patient and are negative.  I have reviewed the past medical history, past surgical history, social history and family history with the patient and they are unchanged from previous note.  ALLERGIES:  is allergic to tikosyn [dofetilide]; xarelto [rivaroxaban]; epinephrine; ketamine; and adhesive [tape].  MEDICATIONS:  Current Outpatient Prescriptions  Medication Sig Dispense Refill  . acetaminophen (TYLENOL 8 HOUR) 650 MG CR tablet Take 1 tablet (650 mg total) by mouth every 8 (eight) hours as needed for pain. 30 tablet 0  . allopurinol (ZYLOPRIM) 300 MG tablet Take 1 tablet (300 mg total) by mouth daily. 30 tablet 3  . apixaban (ELIQUIS) 5 MG TABS tablet Take 1 tablet (5 mg total) by mouth 2 (two) times daily. 180 tablet 1  . cycloSPORINE (RESTASIS) 0.05 % ophthalmic emulsion Place 1 drop into both eyes 2 (two) times daily.    Marland Kitchen dicyclomine (BENTYL) 20 MG tablet Take 1 tablet (20 mg total) by mouth every 6 (six) hours. As needed 60 tablet 1  . diphenhydramine-acetaminophen (TYLENOL PM) 25-500 MG TABS tablet Take 1 tablet by mouth at bedtime as needed.    . diphenoxylate-atropine (LOMOTIL) 2.5-0.025 MG tablet TAKE 2 TABLETS BY MOUTH TWICE DAILY AS NEEDED FOR DIARRHEA OR LOOSE STOOL 30 tablet 0  . furosemide (LASIX) 40 MG tablet Take 40  mg by mouth daily as needed for fluid.     Marland Kitchen levothyroxine (SYNTHROID, LEVOTHROID) 175 MCG tablet Take 175 mcg by mouth daily before breakfast.    . lidocaine-prilocaine (EMLA) cream Apply to affected area once 30 g 3  . Lifitegrast (XIIDRA) 5 % SOLN Apply 1 drop to eye 2 (two)  times daily.    Marland Kitchen LORazepam (ATIVAN) 0.5 MG tablet Take 1 tablet (0.5 mg total) by mouth at bedtime. 30 tablet 0  . Multiple Vitamin (MULTIVITAMIN) tablet Take 1 tablet by mouth daily.     . ondansetron (ZOFRAN) 8 MG tablet Take 1 tablet (8 mg total) by mouth 2 (two) times daily as needed for refractory nausea / vomiting. Start on day 3 after cyclophosphamide chemotherapy. 30 tablet 1  . Oxycodone HCl 10 MG TABS Take 1 tablet (10 mg total) by mouth every 4 (four) hours as needed. 20 tablet 0  . predniSONE (DELTASONE) 10 MG tablet 30 mg QD x 4 days, 20 mg QD x 4 days, 10 mg x 4 days, 5 mg QD x 5 days, then stop 26 tablet 0  . predniSONE (DELTASONE) 20 MG tablet Take 3 tablets (60 mg total) by mouth daily. Take on days 1-5 of chemotherapy. 30 tablet 3  . sucralfate (CARAFATE) 1 g tablet Take 1 tablet (1 g total) by mouth 4 (four) times daily. 120 tablet 2   No current facility-administered medications for this visit.     PHYSICAL EXAMINATION: ECOG PERFORMANCE STATUS: 1 - Symptomatic but completely ambulatory  Vitals:   06/25/17 0950  BP: (!) 145/89  Pulse: 92  Resp: 18  Temp: 97.9 F (36.6 C)  SpO2: 97%   Filed Weights   06/25/17 0950  Weight: 218 lb 6.4 oz (99.1 kg)    GENERAL:alert, no distress and comfortable SKIN: skin color, texture, turgor are normal, no rashes or significant lesions EYES: normal, Conjunctiva are pink and non-injected, sclera clear OROPHARYNX:no exudate, no erythema and lips, buccal mucosa, and tongue normal  NECK: supple, thyroid normal size, non-tender, without nodularity LYMPH:  no palpable lymphadenopathy in the cervical, axillary or inguinal LUNGS: clear to auscultation and percussion with normal breathing effort HEART: regular rate & rhythm and no murmurs and no lower extremity edema ABDOMEN:abdomen soft, non-tender and normal bowel sounds MUSCULOSKELETAL:no cyanosis of digits and no clubbing  NEURO: alert & oriented x 3 with fluent speech, no focal  motor/sensory deficits EXTREMITIES: No lower extremity edema  LABORATORY DATA:  I have reviewed the data as listed   Chemistry      Component Value Date/Time   NA 141 06/25/2017 0912   K 4.0 06/25/2017 0912   CL 106 02/08/2017 0233   CO2 24 06/25/2017 0912   BUN 20.3 06/25/2017 0912   CREATININE 1.0 06/25/2017 0912      Component Value Date/Time   CALCIUM 9.3 06/25/2017 0912   ALKPHOS 76 06/25/2017 0912   AST 20 06/25/2017 0912   ALT 22 06/25/2017 0912   BILITOT 0.46 06/25/2017 0912       Lab Results  Component Value Date   WBC 10.4 (H) 06/25/2017   HGB 10.8 (L) 06/25/2017   HCT 32.8 (L) 06/25/2017   MCV 100.9 06/25/2017   PLT 215 06/25/2017   NEUTROABS 8.3 (H) 06/25/2017    ASSESSMENT & PLAN:  Non-Hodgkin lymphoma, unspecified, intrathoracic lymph nodes (HCC) 02/03/2017: Large superior mediastinal mass contiguous with right hilar region encasing the trachea and right mainstem bronchus measuring 5.4 x 7.5 x 6 cm  causing SVC obstruction, additional mediastinal lymph nodes subcarinal 1.6 cm and left hilar 1 cm; multiple small pulmonary nodules 4-6 mm   Bronchoscopy and biopsy 02/09/2017: Monoclonal B-cell population expressing CD10 consistent with non-Hodgkin's B-cell lymphoma germinal center origin Bone marrow biopsy 02/21/2017: No evidence of lymphoma PET/CT scan 97/53/0051: Hypermetabolic right paratracheal nodal mass ----------------------------------------------------------------------- Current treatment: Cycle 5 day 1 R-CHOP Chemotherapy toxicities: 1Severe profound diarrhea: In spite of Lomotil and Imodium, she is alternating these and this is manageable. 2. Fatigue and headaches,patient attributes this to dexamethasone. It has improved since dexamethasone was decreased. 3. Dehydration: Patient does fit the right fluids has not helped her. 4. Shortness of breath: hard to tell what the etiology is.  Patient has seen Dr. Caryl Comes with cardiology who stopped her  amiodarone. Her respiratory symptoms have improved slightly. 5. 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 We will discontinue when questioned as of today.  Return to clinic in 3 weeks for cycle 6    I spent 25 minutes talking to the patient of which more than half was spent in counseling and coordination of care.  No orders of the defined types were placed in this encounter.  The patient has a good understanding of the overall plan. she agrees with it. she will call with any problems that may develop before the next visit here.   Rulon Eisenmenger, MD 06/25/17

## 2017-06-25 NOTE — Patient Instructions (Signed)
[place] Discharge Instructions for Patients Receiving Chemotherapy  Today you received the following chemotherapy agents Adriamycin/Cytoxan/Rituxan To help prevent nausea and vomiting after your treatment, we encourage you to take your nausea medication as prescribed.   If you develop nausea and vomiting that is not controlled by your nausea medication, call the clinic.   BELOW ARE SYMPTOMS THAT SHOULD BE REPORTED IMMEDIATELY:  *FEVER GREATER THAN 100.5 F  *CHILLS WITH OR WITHOUT FEVER  NAUSEA AND VOMITING THAT IS NOT CONTROLLED WITH YOUR NAUSEA MEDICATION  *UNUSUAL SHORTNESS OF BREATH  *UNUSUAL BRUISING OR BLEEDING  TENDERNESS IN MOUTH AND THROAT WITH OR WITHOUT PRESENCE OF ULCERS  *URINARY PROBLEMS  *BOWEL PROBLEMS  UNUSUAL RASH Items with * indicate a potential emergency and should be followed up as soon as possible.  Feel free to call the clinic you have any questions or concerns. The clinic phone number is [phone].  Please show the [place] at check-in to the Emergency Department and triage nurse.

## 2017-07-16 ENCOUNTER — Other Ambulatory Visit: Payer: Self-pay

## 2017-07-16 ENCOUNTER — Encounter: Payer: Self-pay | Admitting: Hematology and Oncology

## 2017-07-16 ENCOUNTER — Ambulatory Visit (HOSPITAL_BASED_OUTPATIENT_CLINIC_OR_DEPARTMENT_OTHER): Payer: Medicare Other | Admitting: Hematology and Oncology

## 2017-07-16 ENCOUNTER — Telehealth: Payer: Self-pay

## 2017-07-16 ENCOUNTER — Ambulatory Visit: Payer: Medicare Other

## 2017-07-16 ENCOUNTER — Telehealth: Payer: Self-pay | Admitting: Internal Medicine

## 2017-07-16 ENCOUNTER — Ambulatory Visit (HOSPITAL_BASED_OUTPATIENT_CLINIC_OR_DEPARTMENT_OTHER): Payer: Medicare Other

## 2017-07-16 ENCOUNTER — Other Ambulatory Visit (HOSPITAL_BASED_OUTPATIENT_CLINIC_OR_DEPARTMENT_OTHER): Payer: Medicare Other

## 2017-07-16 VITALS — BP 106/72 | HR 128 | Temp 98.2°F | Resp 18

## 2017-07-16 DIAGNOSIS — G620 Drug-induced polyneuropathy: Secondary | ICD-10-CM | POA: Diagnosis not present

## 2017-07-16 DIAGNOSIS — Z5111 Encounter for antineoplastic chemotherapy: Secondary | ICD-10-CM | POA: Diagnosis not present

## 2017-07-16 DIAGNOSIS — R530 Neoplastic (malignant) related fatigue: Secondary | ICD-10-CM

## 2017-07-16 DIAGNOSIS — Z5189 Encounter for other specified aftercare: Secondary | ICD-10-CM

## 2017-07-16 DIAGNOSIS — R51 Headache: Secondary | ICD-10-CM

## 2017-07-16 DIAGNOSIS — K521 Toxic gastroenteritis and colitis: Secondary | ICD-10-CM | POA: Diagnosis not present

## 2017-07-16 DIAGNOSIS — C8522 Mediastinal (thymic) large B-cell lymphoma, intrathoracic lymph nodes: Secondary | ICD-10-CM

## 2017-07-16 DIAGNOSIS — Z5112 Encounter for antineoplastic immunotherapy: Secondary | ICD-10-CM

## 2017-07-16 DIAGNOSIS — C383 Malignant neoplasm of mediastinum, part unspecified: Secondary | ICD-10-CM

## 2017-07-16 DIAGNOSIS — R0602 Shortness of breath: Secondary | ICD-10-CM

## 2017-07-16 DIAGNOSIS — E860 Dehydration: Secondary | ICD-10-CM

## 2017-07-16 LAB — COMPREHENSIVE METABOLIC PANEL
ALT: 12 U/L (ref 0–55)
ANION GAP: 11 meq/L (ref 3–11)
AST: 17 U/L (ref 5–34)
Albumin: 2.3 g/dL — ABNORMAL LOW (ref 3.5–5.0)
Alkaline Phosphatase: 78 U/L (ref 40–150)
BUN: 19.2 mg/dL (ref 7.0–26.0)
CHLORIDE: 107 meq/L (ref 98–109)
CO2: 21 mEq/L — ABNORMAL LOW (ref 22–29)
CREATININE: 1 mg/dL (ref 0.6–1.1)
Calcium: 8.9 mg/dL (ref 8.4–10.4)
EGFR: 54 mL/min/{1.73_m2} — ABNORMAL LOW (ref >60)
Glucose: 150 mg/dl — ABNORMAL HIGH (ref 70–140)
Potassium: 4.2 mEq/L (ref 3.5–5.1)
Sodium: 140 mEq/L (ref 136–145)
Total Bilirubin: 0.45 mg/dL (ref 0.20–1.20)
Total Protein: 5.9 g/dL — ABNORMAL LOW (ref 6.4–8.3)

## 2017-07-16 LAB — CBC WITH DIFFERENTIAL/PLATELET
BASO%: 1.1 % (ref 0.0–2.0)
Basophils Absolute: 0.1 10*3/uL (ref 0.0–0.1)
EOS%: 1.4 % (ref 0.0–7.0)
Eosinophils Absolute: 0.2 10*3/uL (ref 0.0–0.5)
HCT: 27.6 % — ABNORMAL LOW (ref 34.8–46.6)
HGB: 8.9 g/dL — ABNORMAL LOW (ref 11.6–15.9)
LYMPH%: 1.9 % — AB (ref 14.0–49.7)
MCH: 32 pg (ref 25.1–34.0)
MCHC: 32.3 g/dL (ref 31.5–36.0)
MCV: 99.1 fL (ref 79.5–101.0)
MONO#: 1.4 10*3/uL — ABNORMAL HIGH (ref 0.1–0.9)
MONO%: 12.2 % (ref 0.0–14.0)
NEUT#: 9.4 10*3/uL — ABNORMAL HIGH (ref 1.5–6.5)
NEUT%: 83.4 % — AB (ref 38.4–76.8)
PLATELETS: 337 10*3/uL (ref 145–400)
RBC: 2.78 10*6/uL — ABNORMAL LOW (ref 3.70–5.45)
RDW: 18.2 % — ABNORMAL HIGH (ref 11.2–14.5)
WBC: 11.3 10*3/uL — ABNORMAL HIGH (ref 3.9–10.3)
lymph#: 0.2 10*3/uL — ABNORMAL LOW (ref 0.9–3.3)

## 2017-07-16 MED ORDER — DEXAMETHASONE SODIUM PHOSPHATE 10 MG/ML IJ SOLN
10.0000 mg | Freq: Once | INTRAMUSCULAR | Status: AC
  Administered 2017-07-16: 8 mg via INTRAVENOUS

## 2017-07-16 MED ORDER — ACETAMINOPHEN 325 MG PO TABS
650.0000 mg | ORAL_TABLET | Freq: Once | ORAL | Status: AC
  Administered 2017-07-16: 650 mg via ORAL

## 2017-07-16 MED ORDER — PALONOSETRON HCL INJECTION 0.25 MG/5ML
INTRAVENOUS | Status: AC
  Filled 2017-07-16: qty 5

## 2017-07-16 MED ORDER — DEXAMETHASONE SODIUM PHOSPHATE 10 MG/ML IJ SOLN
INTRAMUSCULAR | Status: AC
  Filled 2017-07-16: qty 1

## 2017-07-16 MED ORDER — ACETAMINOPHEN 325 MG PO TABS
ORAL_TABLET | ORAL | Status: AC
  Filled 2017-07-16: qty 2

## 2017-07-16 MED ORDER — METOPROLOL TARTRATE 50 MG PO TABS
25.0000 mg | ORAL_TABLET | Freq: Once | ORAL | Status: AC
  Administered 2017-07-16: 25 mg via ORAL
  Filled 2017-07-16: qty 0.5

## 2017-07-16 MED ORDER — HYDROCODONE-HOMATROPINE 5-1.5 MG/5ML PO SYRP: 5.0000 mL | ORAL_SOLUTION | Freq: Four times a day (QID) | ORAL | 0 refills | Status: DC | PRN

## 2017-07-16 MED ORDER — SODIUM CHLORIDE 0.9 % IV SOLN
375.0000 mg/m2 | Freq: Once | INTRAVENOUS | Status: AC
  Administered 2017-07-16: 800 mg via INTRAVENOUS
  Filled 2017-07-16: qty 30

## 2017-07-16 MED ORDER — PALONOSETRON HCL INJECTION 0.25 MG/5ML
0.2500 mg | Freq: Once | INTRAVENOUS | Status: AC
  Administered 2017-07-16: 0.25 mg via INTRAVENOUS

## 2017-07-16 MED ORDER — DOXORUBICIN HCL CHEMO IV INJECTION 2 MG/ML
40.0000 mg/m2 | Freq: Once | INTRAVENOUS | Status: AC
  Administered 2017-07-16: 88 mg via INTRAVENOUS
  Filled 2017-07-16: qty 44

## 2017-07-16 MED ORDER — SODIUM CHLORIDE 0.9 % IV SOLN
Freq: Once | INTRAVENOUS | Status: AC
  Administered 2017-07-16: 10:00:00 via INTRAVENOUS

## 2017-07-16 MED ORDER — PEGFILGRASTIM 6 MG/0.6ML ~~LOC~~ PSKT
6.0000 mg | PREFILLED_SYRINGE | Freq: Once | SUBCUTANEOUS | Status: AC
  Administered 2017-07-16: 6 mg via SUBCUTANEOUS
  Filled 2017-07-16: qty 0.6

## 2017-07-16 MED ORDER — DIPHENHYDRAMINE HCL 25 MG PO CAPS
ORAL_CAPSULE | ORAL | Status: AC
  Filled 2017-07-16: qty 2

## 2017-07-16 MED ORDER — LEVOFLOXACIN 750 MG PO TABS: 750.0000 mg | ORAL_TABLET | Freq: Every day | ORAL | 0 refills | Status: DC

## 2017-07-16 MED ORDER — SODIUM CHLORIDE 0.9 % IV SOLN
600.0000 mg/m2 | Freq: Once | INTRAVENOUS | Status: AC
  Administered 2017-07-16: 1320 mg via INTRAVENOUS
  Filled 2017-07-16: qty 66

## 2017-07-16 MED ORDER — SODIUM CHLORIDE 0.9% FLUSH
10.0000 mL | INTRAVENOUS | Status: DC | PRN
  Administered 2017-07-16: 10 mL
  Filled 2017-07-16: qty 10

## 2017-07-16 MED ORDER — HEPARIN SOD (PORK) LOCK FLUSH 100 UNIT/ML IV SOLN
500.0000 [IU] | Freq: Once | INTRAVENOUS | Status: AC | PRN
  Administered 2017-07-16: 500 [IU]
  Filled 2017-07-16: qty 5

## 2017-07-16 MED ORDER — DIPHENHYDRAMINE HCL 25 MG PO CAPS
50.0000 mg | ORAL_CAPSULE | Freq: Once | ORAL | Status: AC
  Administered 2017-07-16: 50 mg via ORAL

## 2017-07-16 NOTE — Telephone Encounter (Signed)
We need to know wehter this is sinus or afib Can we have them get ecg at cancer center or here Thanks

## 2017-07-16 NOTE — Patient Instructions (Signed)
[place] Discharge Instructions for Patients Receiving Chemotherapy  Today you received the following chemotherapy agents Rituxan, Adriamycin, Cytoxan  To help prevent nausea and vomiting after your treatment, we encourage you to take your nausea medication as prescribed.  If you develop nausea and vomiting that is not controlled by your nausea medication, call the clinic.   BELOW ARE SYMPTOMS THAT SHOULD BE REPORTED IMMEDIATELY:  *FEVER GREATER THAN 100.5 F  *CHILLS WITH OR WITHOUT FEVER  NAUSEA AND VOMITING THAT IS NOT CONTROLLED WITH YOUR NAUSEA MEDICATION  *UNUSUAL SHORTNESS OF BREATH  *UNUSUAL BRUISING OR BLEEDING  TENDERNESS IN MOUTH AND THROAT WITH OR WITHOUT PRESENCE OF ULCERS  *URINARY PROBLEMS  *BOWEL PROBLEMS  UNUSUAL RASH Items with * indicate a potential emergency and should be followed up as soon as possible.  Feel free to call the clinic should you have any questions or concerns. The clinic phone number is [phone].  Please show the [place] at check-in to the Emergency Department and triage nurse.

## 2017-07-16 NOTE — Telephone Encounter (Signed)
New Message  May from the [facility] call requesting to speak with RN to report pt has chemotherapy on today and heart rate is currently in the 120-130s range. She states pt has some SOB, Dr. Lindi Adie has stop Amiodarone. Please call back to discuss

## 2017-07-16 NOTE — Patient Instructions (Signed)
Implanted Port Home Guide An implanted port is a type of central line that is placed under the skin. Central lines are used to provide IV access when treatment or nutrition needs to be given through a person's veins. Implanted ports are used for long-term IV access. An implanted port may be placed because:  You need IV medicine that would be irritating to the small veins in your hands or arms.  You need long-term IV medicines, such as antibiotics.  You need IV nutrition for a long period.  You need frequent blood draws for lab tests.  You need dialysis.  Implanted ports are usually placed in the chest area, but they can also be placed in the upper arm, the abdomen, or the leg. An implanted port has two main parts:  Reservoir. The reservoir is round and will appear as a small, raised area under your skin. The reservoir is the part where a needle is inserted to give medicines or draw blood.  Catheter. The catheter is a thin, flexible tube that extends from the reservoir. The catheter is placed into a large vein. Medicine that is inserted into the reservoir goes into the catheter and then into the vein.  How will I care for my incision site? Do not get the incision site wet. Bathe or shower as directed by your health care provider. How is my port accessed? Special steps must be taken to access the port:  Before the port is accessed, a numbing cream can be placed on the skin. This helps numb the skin over the port site.  Your health care provider uses a sterile technique to access the port. ? Your health care provider must put on a mask and sterile gloves. ? The skin over your port is cleaned carefully with an antiseptic and allowed to dry. ? The port is gently pinched between sterile gloves, and a needle is inserted into the port.  Only "non-coring" port needles should be used to access the port. Once the port is accessed, a blood return should be checked. This helps ensure that the port  is in the vein and is not clogged.  If your port needs to remain accessed for a constant infusion, a clear (transparent) bandage will be placed over the needle site. The bandage and needle will need to be changed every week, or as directed by your health care provider.  Keep the bandage covering the needle clean and dry. Do not get it wet. Follow your health care provider's instructions on how to take a shower or bath while the port is accessed.  If your port does not need to stay accessed, no bandage is needed over the port.  What is flushing? Flushing helps keep the port from getting clogged. Follow your health care provider's instructions on how and when to flush the port. Ports are usually flushed with saline solution or a medicine called heparin. The need for flushing will depend on how the port is used.  If the port is used for intermittent medicines or blood draws, the port will need to be flushed: ? After medicines have been given. ? After blood has been drawn. ? As part of routine maintenance.  If a constant infusion is running, the port may not need to be flushed.  How long will my port stay implanted? The port can stay in for as long as your health care provider thinks it is needed. When it is time for the port to come out, surgery will be   done to remove it. The procedure is similar to the one performed when the port was put in. When should I seek immediate medical care? When you have an implanted port, you should seek immediate medical care if:  You notice a bad smell coming from the incision site.  You have swelling, redness, or drainage at the incision site.  You have more swelling or pain at the port site or the surrounding area.  You have a fever that is not controlled with medicine.  This information is not intended to replace advice given to you by your health care provider. Make sure you discuss any questions you have with your health care provider. Document  Released: 09/09/2005 Document Revised: 02/15/2016 Document Reviewed: 05/17/2013 Elsevier Interactive Patient Education  2017 Elsevier Inc.  

## 2017-07-16 NOTE — Assessment & Plan Note (Signed)
02/03/2017: Large superior mediastinal mass contiguous with right hilar region encasing the trachea and right mainstem bronchus measuring 5.4 x 7.5 x 6 cm causing SVC obstruction, additional mediastinal lymph nodes subcarinal 1.6 cm and left hilar 1 cm; multiple small pulmonary nodules 4-6 mm   Bronchoscopy and biopsy 02/09/2017: Monoclonal B-cell population expressing CD10 consistent with non-Hodgkin's B-cell lymphoma germinal center origin Bone marrow biopsy 02/21/2017: No evidence of lymphoma PET/CT scan 60/60/0459: Hypermetabolic right paratracheal nodal mass ----------------------------------------------------------------------- Current treatment: Cycle 5 day 1 R-CHOP Chemotherapy toxicities: 1Severe profound diarrhea: In spite of Lomotil and Imodium, she is alternating these and this is manageable. 2. Fatigue and headaches,patient attributes this to dexamethasone. It has improved since dexamethasone was decreased. 3. Dehydration: Patient does fit the right fluids has not helped her. 4. Shortness of breath: hard to tell what the etiology is.  Patient has seen Dr. Caryl Comes with cardiology who stopped her amiodarone. Her respiratory symptoms have improved slightly. 5. 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 We will discontinue when questioned as of today.  Return to clinic in 6 weeks after PET/CT scan

## 2017-07-16 NOTE — Telephone Encounter (Signed)
Called Heart care at Dr.Klein's office to speak with nurse or triage regarding pt increased heart rate today in the high 120's. Pt is asymptomatic and is currently receiving R-CHOP. Per Dr.Gudena, okay to give 1 time dose of metoprolol 25mg  po to see if HR comes down. Monitor BP closely with rituxan infusion. Left call back number with Dr.Klein's office for any further updates and instructions for pt.   Called in home health PT for pt, per Dr.Gudena request. Spoke with Santiago Glad, RN [facility]) to take in referral.

## 2017-07-16 NOTE — Progress Notes (Signed)
Patient Care Team: [name], MD as PCP - General (Endocrinology)  DIAGNOSIS:  Encounter Diagnosis  Name Primary?  . 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)     SUMMARY OF ONCOLOGIC HISTORY:   Non-Hodgkin lymphoma, unspecified, intrathoracic lymph nodes ([place])   02/03/2017 Imaging    Large superior mediastinal mass contiguous with right hilar region encasing the trachea and right mainstem bronchus measuring 5.4 x 7.5 x 6 cm causing SVC obstruction, additional mediastinal lymph nodes subcarinal 1.6 cm and left hilar 1 cm; multiple small pulmonary nodules 4-6 mm       02/11/2017 Initial Diagnosis    Flow cytometry of mediastinal mass revealed monoclonal population of B cells with expression of CD10 consistent with non-Hodgkin B-cell lymphoma germinal center operation; bone marrow biopsy negative for lymphoma      04/02/2017 -  Chemotherapy    R-CHOP X 6 cycles        CHIEF COMPLIANT: R-CHOP cycle 6, profound shortness of breath, using a wheelchair  INTERVAL HISTORY: Kayla Harrison is a 71 year old with the above-mentioned history of large mediastinal mass diffuse large B cell mphoma who is here for cycle 6 of R CHOP chemotherapy.  She continues to have profound shortness of breath even at rest.  She has been unable to bathe herself or doing activities of daily living because of the shortness of breath.  She has seen cardiology and there is no problems with her heart.  She has had CT scan of the chest and in September which did not show any blood clots.  It did show some radiation pneumonitis.  She is here to receive him finish her final cycle of R CHOP chemotherapy.  She complains of shortness of breath even at rest and also complains of a dry cough productive sputum as noted in the morning but otherwise rest of the day it is a dry cough.  REVIEW OF SYSTEMS:   Constitutional: Denies fevers, chills or abnormal weight loss Eyes: Denies  blurriness of vision Ears, nose, mouth, throat, and face: Denies mucositis or sore throat Respiratory: Cough and shortness of breath on minimal exertion Cardiovascular: Denies palpitation, chest discomfort Gastrointestinal:  Denies nausea, heartburn or change in bowel habits Skin: Denies abnormal skin rashes Lymphatics: Denies new lymphadenopathy or easy bruising Neurological:Denies numbness, tingling or new weaknesses Behavioral/Psych: Mood is stable, no new changes  Extremities: No lower extremity edema Breast:  denies any pain or lumps or nodules in either breasts All other systems were reviewed with the patient and are negative.  I have reviewed the past medical history, past surgical history, social history and family history with the patient and they are unchanged from previous note.  ALLERGIES:  is allergic to tikosyn [dofetilide]; xarelto [rivaroxaban]; epinephrine; ketamine; and adhesive [tape].  MEDICATIONS:  Current Outpatient Prescriptions  Medication Sig Dispense Refill  . acetaminophen (TYLENOL 8 HOUR) 650 MG CR tablet Take 1 tablet (650 mg total) by mouth every 8 (eight) hours as needed for pain. 30 tablet 0  . allopurinol (ZYLOPRIM) 300 MG tablet Take 1 tablet (300 mg total) by mouth daily. 30 tablet 3  . apixaban (ELIQUIS) 5 MG TABS tablet Take 1 tablet (5 mg total) by mouth 2 (two) times daily. 180 tablet 1  . cycloSPORINE (RESTASIS) 0.05 % ophthalmic emulsion Place 1 drop into both eyes 2 (two) times daily.    Marland Kitchen dicyclomine (BENTYL) 20 MG tablet Take 1 tablet (20 mg total) by mouth every 6 (six) hours.  As needed 60 tablet 1  . diphenhydramine-acetaminophen (TYLENOL PM) 25-500 MG TABS tablet Take 1 tablet by mouth at bedtime as needed.    . diphenoxylate-atropine (LOMOTIL) 2.5-0.025 MG tablet TAKE 2 TABLETS BY MOUTH TWICE DAILY AS NEEDED FOR DIARRHEA OR LOOSE STOOL 30 tablet 0  . furosemide (LASIX) 40 MG tablet Take 40 mg by mouth daily as needed for fluid.     Marland Kitchen  HYDROcodone-homatropine (HYCODAN) 5-1.5 MG/5ML syrup Take 5 mLs by mouth every 6 (six) hours as needed for cough. 120 mL 0  . levofloxacin (LEVAQUIN) 750 MG tablet Take 1 tablet (750 mg total) by mouth daily. 7 tablet 0  . levothyroxine (SYNTHROID, LEVOTHROID) 175 MCG tablet Take 175 mcg by mouth daily before breakfast.    . lidocaine-prilocaine (EMLA) cream Apply to affected area once 30 g 3  . Lifitegrast (XIIDRA) 5 % SOLN Apply 1 drop to eye 2 (two) times daily.    Marland Kitchen LORazepam (ATIVAN) 0.5 MG tablet Take 1 tablet (0.5 mg total) by mouth at bedtime. 30 tablet 0  . Multiple Vitamin (MULTIVITAMIN) tablet Take 1 tablet by mouth daily.     . ondansetron (ZOFRAN) 8 MG tablet Take 1 tablet (8 mg total) by mouth 2 (two) times daily as needed for refractory nausea / vomiting. Start on day 3 after cyclophosphamide chemotherapy. 30 tablet 1  . Oxycodone HCl 10 MG TABS Take 1 tablet (10 mg total) by mouth every 4 (four) hours as needed. 20 tablet 0  . predniSONE (DELTASONE) 10 MG tablet 30 mg QD x 4 days, 20 mg QD x 4 days, 10 mg x 4 days, 5 mg QD x 5 days, then stop 26 tablet 0  . predniSONE (DELTASONE) 20 MG tablet Take 3 tablets (60 mg total) by mouth daily. Take on days 1-5 of chemotherapy. 30 tablet 3  . sucralfate (CARAFATE) 1 g tablet Take 1 tablet (1 g total) by mouth 4 (four) times daily. 120 tablet 2   No current facility-administered medications for this visit.     PHYSICAL EXAMINATION: ECOG PERFORMANCE STATUS: 1 - Symptomatic but completely ambulatory  Vitals:   07/16/17 0831  BP: 137/79  Pulse: 100  Resp: 17  Temp: 97.8 F (36.6 C)  SpO2: 93%   Filed Weights   07/16/17 0831  Weight: 216 lb 12.8 oz (98.3 kg)    GENERAL:alert, no distress and comfortable SKIN: skin color, texture, turgor are normal, no rashes or significant lesions EYES: normal, Conjunctiva are pink and non-injected, sclera clear OROPHARYNX:no exudate, no erythema and lips, buccal mucosa, and tongue normal    NECK: supple, thyroid normal size, non-tender, without nodularity LYMPH:  no palpable lymphadenopathy in the cervical, axillary or inguinal LUNGS: clear to auscultation and percussion with normal breathing effort HEART: regular rate & rhythm and no murmurs and no lower extremity edema ABDOMEN:abdomen soft, non-tender and normal bowel sounds MUSCULOSKELETAL:no cyanosis of digits and no clubbing  NEURO: alert & oriented x 3 with fluent speech, no focal motor/sensory deficits EXTREMITIES: No lower extremity edema  LABORATORY DATA:  I have reviewed the data as listed   Chemistry      Component Value Date/Time   NA 140 07/16/2017 0757   K 4.2 07/16/2017 0757   CL 106 02/08/2017 0233   CO2 21 (L) 07/16/2017 0757   BUN 19.2 07/16/2017 0757   CREATININE 1.0 07/16/2017 0757      Component Value Date/Time   CALCIUM 8.9 07/16/2017 0757   ALKPHOS 78 07/16/2017 0757  AST 17 07/16/2017 0757   ALT 12 07/16/2017 0757   BILITOT 0.45 07/16/2017 0757       Lab Results  Component Value Date   WBC 11.3 (H) 07/16/2017   HGB 8.9 (L) 07/16/2017   HCT 27.6 (L) 07/16/2017   MCV 99.1 07/16/2017   PLT 337 07/16/2017   NEUTROABS 9.4 (H) 07/16/2017    ASSESSMENT & PLAN:  Non-Hodgkin lymphoma, unspecified, intrathoracic lymph nodes ([place]) 02/03/2017: Large superior mediastinal mass contiguous with right hilar region encasing the trachea and right mainstem bronchus measuring 5.4 x 7.5 x 6 cm causing SVC obstruction, additional mediastinal lymph nodes subcarinal 1.6 cm and left hilar 1 cm; multiple small pulmonary nodules 4-6 mm   Bronchoscopy and biopsy 02/09/2017: Monoclonal B-cell population expressing CD10 consistent with non-Hodgkin's B-cell lymphoma germinal center origin Bone marrow biopsy 02/21/2017: No evidence of lymphoma PET/CT scan 87/56/4332: Hypermetabolic right paratracheal nodal mass ----------------------------------------------------------------------- Current treatment: Cycle 5  day 1 R-CHOP Chemotherapy toxicities: 1Severe profound diarrhea: In spite of Lomotil and Imodium, she is alternating these and this is manageable. 2. Fatigue and headaches,patient attributes this to dexamethasone. It has improved since dexamethasone was decreased. 3. Dehydration: Patient does fit the right fluids has not helped her. 4. Shortness of breath: hard to tell what the etiology is.  I suspect that this may be related to radiation pneumonitis.  I have prescribed her a course of antibiotic with Levaquin today.  We will also prescribe her Hycodan cough syrup to be taken at bedtime. Patient has seen Dr. Caryl Comes with cardiology who stopped her amiodarone.  5. 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  It was discontinued with cycle 5.  Return to clinic in 6 weeks after PET/CT scan   I spent 25 minutes talking to the patient of which more than half was spent in counseling and coordination of care.  Orders Placed This Encounter  Procedures  . NM PET Image Initial (PI) Skull Base To Thigh    Standing Status:   Future    Standing Expiration Date:   07/16/2018    Order Specific Question:   If indicated for the ordered procedure, I authorize the administration of a radiopharmaceutical per Radiology protocol    Answer:   Yes    Order Specific Question:   Preferred imaging location?    Answer:   [facility]    Order Specific Question:   Call Results- Best Contact Number?    Answer:   resatging NHL after chemo    Order Specific Question:   Radiology Contrast Protocol - do NOT remove file path    Answer:   [email or web address]   The patient has a good understanding of the overall plan. she agrees with it. she will call with any problems that may develop before the next visit here.   Rulon Eisenmenger, MD 07/16/17

## 2017-07-16 NOTE — Telephone Encounter (Signed)
Spoke with May, RN at [facility] who reports that patient's HR was 129 bpm when patient arrived for her appointment today.  She states patient is asymptomatic. Dr. Lindi Adie is treating patient for radiation pneumonitis and has prescribed antibiotics and cough medicine. She states patient was given Metoprolol 25 mg x 1; states they did not want to give a higher dose because the type of chemo that she gets lowers BP. She states patient reports HR is frequently > 100 bpm since stopping amiodarone as directed by Dr. Caryl Comes on 06/20/17. I advised that I will forward message to Dr. Caryl Comes for advice. Kayla Harrison thanked me for the call.

## 2017-07-16 NOTE — Progress Notes (Signed)
Per May, RN with Dr Lindi Adie, 8mg  dex given instead of 10mg  per pt request.    Blood return present before, during, and after adriamycin infusion.    Prior to starting rituxan infusion, HR 128-130.  Pt states she has hx of afib.  Dr. Lindi Adie notified.  Ok to proceed with rituxan.  25mg  metoprolol given.  May, RN to notify pt's cardiologist.  Will continue to monitor pt during rituxan infusion.    Post rituxan HR still elevated to 128.  Dr. Lindi Adie notified.  Ok to d/c.  Pt cardiologist to contact pt.

## 2017-07-17 ENCOUNTER — Encounter: Payer: Self-pay | Admitting: Nurse Practitioner

## 2017-07-17 ENCOUNTER — Ambulatory Visit (INDEPENDENT_AMBULATORY_CARE_PROVIDER_SITE_OTHER): Payer: Medicare Other | Admitting: Nurse Practitioner

## 2017-07-17 ENCOUNTER — Other Ambulatory Visit: Payer: Self-pay | Admitting: Hematology and Oncology

## 2017-07-17 DIAGNOSIS — I4892 Unspecified atrial flutter: Secondary | ICD-10-CM | POA: Insufficient documentation

## 2017-07-17 DIAGNOSIS — C8522 Mediastinal (thymic) large B-cell lymphoma, intrathoracic lymph nodes: Secondary | ICD-10-CM

## 2017-07-17 NOTE — Patient Instructions (Addendum)
Medication Instructions:  Your physician recommends that you continue on your current medications as directed. Please refer to the Current Medication list given to you today.   Labwork: TODAY - CBC, BMET   Testing/Procedures: Your physician has recommended that you have a Cardioversion (DCCV). Electrical Cardioversion uses a jolt of electricity to your heart either through paddles or wired patches attached to your chest. This is a controlled, usually prescheduled, procedure. Defibrillation is done under light anesthesia in the hospital, and you usually go home the day of the procedure. This is done to get your heart back into a normal rhythm. You are not awake for the procedure. Please see the instruction sheet given to you today.   Follow-Up: Your physician recommends that you schedule a follow-up appointment next week with [name], NP   If you need a refill on your cardiac medications before your next appointment, please call your pharmacy.   Thank you for choosing [facility]! Christen Bame, RN [phone]

## 2017-07-17 NOTE — Telephone Encounter (Signed)
I called and spoke to patient to ask her to come in today for ekg per Dr. Caryl Comes. Patient states she continued to have elevated HR yesterday even after the 25 mg of metoprolol she was given at the [facility]. She is scheduled for nurse visit and ekg today at 1:30. I advised her that I will be able to review her ekg with Dr. Caryl Comes at that time. She verbalized understanding and agreement with plan and thanked me for the call.

## 2017-07-17 NOTE — Progress Notes (Signed)
1.) Reason for visit: patient presents for evaluation of elevated HR  2.) Name of MD requesting visit: Dr. Caryl Comes  3.) H&P: patient is currently undergoing chemotherapy and I received a call yesterday regarding patient's HR of >130  4.) ROS related to problem: patient presents in NAD for ekg to evaluated heart rhythm with reported HR >130 bpm  5.) Assessment and plan per MD: per Dr. Caryl Comes, patient's ekg shows atrial flutter; schedule DCCV for tomorrow or Monday and follow-up in approximately 1 week

## 2017-07-18 ENCOUNTER — Ambulatory Visit (HOSPITAL_COMMUNITY): Payer: Medicare Other | Admitting: Certified Registered Nurse Anesthetist

## 2017-07-18 ENCOUNTER — Encounter (HOSPITAL_COMMUNITY)
Admission: RE | Disposition: A | Discharge status: Home / Self Care | Payer: Self-pay | Source: Ambulatory Visit | Attending: Cardiovascular Disease

## 2017-07-18 ENCOUNTER — Encounter (HOSPITAL_COMMUNITY): Payer: Self-pay

## 2017-07-18 ENCOUNTER — Telehealth: Payer: Self-pay | Admitting: Hematology and Oncology

## 2017-07-18 ENCOUNTER — Ambulatory Visit (HOSPITAL_COMMUNITY)
Admission: RE | Admit: 2017-07-18 | Discharge: 2017-07-18 | Disposition: A | Discharge status: Home / Self Care | Payer: Medicare Other | Source: Ambulatory Visit | Attending: Cardiovascular Disease | Admitting: Cardiovascular Disease

## 2017-07-18 DIAGNOSIS — I4892 Unspecified atrial flutter: Secondary | ICD-10-CM | POA: Diagnosis not present

## 2017-07-18 DIAGNOSIS — I4891 Unspecified atrial fibrillation: Secondary | ICD-10-CM | POA: Diagnosis not present

## 2017-07-18 DIAGNOSIS — G4730 Sleep apnea, unspecified: Secondary | ICD-10-CM | POA: Insufficient documentation

## 2017-07-18 DIAGNOSIS — Z9884 Bariatric surgery status: Secondary | ICD-10-CM | POA: Insufficient documentation

## 2017-07-18 DIAGNOSIS — Z85118 Personal history of other malignant neoplasm of bronchus and lung: Secondary | ICD-10-CM | POA: Insufficient documentation

## 2017-07-18 DIAGNOSIS — Z923 Personal history of irradiation: Secondary | ICD-10-CM | POA: Diagnosis not present

## 2017-07-18 DIAGNOSIS — E890 Postprocedural hypothyroidism: Secondary | ICD-10-CM | POA: Diagnosis not present

## 2017-07-18 DIAGNOSIS — R0609 Other forms of dyspnea: Secondary | ICD-10-CM | POA: Diagnosis not present

## 2017-07-18 DIAGNOSIS — E785 Hyperlipidemia, unspecified: Secondary | ICD-10-CM | POA: Insufficient documentation

## 2017-07-18 DIAGNOSIS — Z853 Personal history of malignant neoplasm of breast: Secondary | ICD-10-CM | POA: Insufficient documentation

## 2017-07-18 DIAGNOSIS — M1711 Unilateral primary osteoarthritis, right knee: Secondary | ICD-10-CM | POA: Diagnosis not present

## 2017-07-18 DIAGNOSIS — Z950 Presence of cardiac pacemaker: Secondary | ICD-10-CM | POA: Diagnosis not present

## 2017-07-18 DIAGNOSIS — Z79891 Long term (current) use of opiate analgesic: Secondary | ICD-10-CM | POA: Diagnosis not present

## 2017-07-18 DIAGNOSIS — C8590 Non-Hodgkin lymphoma, unspecified, unspecified site: Secondary | ICD-10-CM | POA: Insufficient documentation

## 2017-07-18 DIAGNOSIS — Z85850 Personal history of malignant neoplasm of thyroid: Secondary | ICD-10-CM | POA: Diagnosis not present

## 2017-07-18 DIAGNOSIS — Z8673 Personal history of transient ischemic attack (TIA), and cerebral infarction without residual deficits: Secondary | ICD-10-CM | POA: Insufficient documentation

## 2017-07-18 DIAGNOSIS — Z7952 Long term (current) use of systemic steroids: Secondary | ICD-10-CM | POA: Diagnosis not present

## 2017-07-18 DIAGNOSIS — I10 Essential (primary) hypertension: Secondary | ICD-10-CM | POA: Diagnosis not present

## 2017-07-18 DIAGNOSIS — Z7901 Long term (current) use of anticoagulants: Secondary | ICD-10-CM | POA: Insufficient documentation

## 2017-07-18 DIAGNOSIS — I739 Peripheral vascular disease, unspecified: Secondary | ICD-10-CM | POA: Diagnosis not present

## 2017-07-18 DIAGNOSIS — Z79899 Other long term (current) drug therapy: Secondary | ICD-10-CM | POA: Insufficient documentation

## 2017-07-18 DIAGNOSIS — Z96651 Presence of right artificial knee joint: Secondary | ICD-10-CM | POA: Diagnosis not present

## 2017-07-18 DIAGNOSIS — N200 Calculus of kidney: Secondary | ICD-10-CM | POA: Diagnosis not present

## 2017-07-18 HISTORY — PX: CARDIOVERSION: SHX1299

## 2017-07-18 SURGERY — CARDIOVERSION
Anesthesia: General

## 2017-07-18 MED ORDER — PROPOFOL 10 MG/ML IV BOLUS
INTRAVENOUS | Status: DC | PRN
  Administered 2017-07-18: 70 mg via INTRAVENOUS

## 2017-07-18 MED ORDER — LIDOCAINE 2% (20 MG/ML) 5 ML SYRINGE
INTRAMUSCULAR | Status: DC | PRN
  Administered 2017-07-18: 40 mg via INTRAVENOUS

## 2017-07-18 MED ORDER — HEPARIN SOD (PORK) LOCK FLUSH 100 UNIT/ML IV SOLN
500.0000 [IU] | INTRAVENOUS | Status: AC | PRN
  Administered 2017-07-18: 500 [IU]

## 2017-07-18 MED ORDER — SODIUM CHLORIDE 0.9 % IV SOLN
INTRAVENOUS | Status: DC | PRN
  Administered 2017-07-18: 11:00:00 via INTRAVENOUS

## 2017-07-18 MED ORDER — SODIUM CHLORIDE 0.9% FLUSH: 10.0000 mL | INTRAVENOUS | Status: DC | PRN

## 2017-07-18 NOTE — Interval H&P Note (Signed)
History and Physical Interval Note:  07/18/2017 9:29 AM  Kayla Harrison  has presented today for surgery, with the diagnosis of AFLUTTER  The various methods of treatment have been discussed with the patient and family. After consideration of risks, benefits and other options for treatment, the patient has consented to  Procedure(s): CARDIOVERSION (N/A) as a surgical intervention .  The patient's history has been reviewed, patient examined, no change in status, stable for surgery.  I have reviewed the patient's chart and labs.  Questions were answered to the patient's satisfaction.     Mertie Moores

## 2017-07-18 NOTE — Telephone Encounter (Signed)
Patient is in hospital - sending confirmation letter in the mail regarding appts added per 10/24 los.

## 2017-07-18 NOTE — Anesthesia Postprocedure Evaluation (Signed)
Anesthesia Post Note  Patient: Kayla Harrison  Procedure(s) Performed: CARDIOVERSION (N/A )     Patient location during evaluation: Endoscopy Anesthesia Type: General Level of consciousness: awake and alert Pain management: pain level controlled Vital Signs Assessment: post-procedure vital signs reviewed and stable Respiratory status: spontaneous breathing, nonlabored ventilation, respiratory function stable and patient connected to nasal cannula oxygen Cardiovascular status: blood pressure returned to baseline and stable Postop Assessment: no apparent nausea or vomiting Anesthetic complications: no    Last Vitals:  Vitals:   07/18/17 1130 07/18/17 1140  BP: (!) 103/55 100/65  Pulse: 86 86  Resp: (!) 25 (!) 28  Temp:    SpO2: 90% 91%    Last Pain:  Vitals:   07/18/17 1124  TempSrc: Oral                 Kinzi Frediani,JAMES TERRILL

## 2017-07-18 NOTE — Anesthesia Preprocedure Evaluation (Addendum)
Anesthesia Evaluation  Patient identified by MRN, date of birth, ID band Patient awake    Reviewed: Allergy & Precautions, NPO status , Patient's Chart, lab work & pertinent test results  History of Anesthesia Complications Negative for: history of anesthetic complications  Airway Mallampati: II  TM Distance: <3 FB Neck ROM: Full  Mouth opening: Limited Mouth Opening  Dental no notable dental hx. (+) Dental Advisory Given   Pulmonary shortness of breath, sleep apnea ,  Non hodgkin lymphoma c mediastiam mass around trachea   breath sounds clear to auscultation       Cardiovascular hypertension, Pt. on home beta blockers + Peripheral Vascular Disease and +CHF  + dysrhythmias + pacemaker  Rhythm:Irregular Rate:Normal  Recent ECHO noted   Neuro/Psych CVA negative psych ROS   GI/Hepatic (+) Hepatitis -  Endo/Other  Morbid obesity  Renal/GU Renal disease     Musculoskeletal   Abdominal   Peds  Hematology Lymphoma   Anesthesia Other Findings   Reproductive/Obstetrics                            Anesthesia Physical  Anesthesia Plan  ASA: III  Anesthesia Plan: General   Post-op Pain Management:    Induction: Intravenous  PONV Risk Score and Plan: 2 and Ondansetron and Propofol infusion  Airway Management Planned: Mask  Additional Equipment:   Intra-op Plan:   Post-operative Plan:   Informed Consent: I have reviewed the patients History and Physical, chart, labs and discussed the procedure including the risks, benefits and alternatives for the proposed anesthesia with the patient or authorized representative who has indicated his/her understanding and acceptance.   Dental advisory given  Plan Discussed with: Anesthesiologist and CRNA  Anesthesia Plan Comments:       Anesthesia Quick Evaluation

## 2017-07-18 NOTE — CV Procedure (Signed)
    Cardioversion Note  Kayla Harrison [id number] 1946/08/03  Procedure: DC Cardioversion Indications: Atrial flutter   Procedure Details Consent: Obtained Time Out: Verified patient identification, verified procedure, site/side was marked, verified correct patient position, special equipment/implants available, Radiology Safety Procedures followed,  medications/allergies/relevent history reviewed, required imaging and test results available.  Performed  The patient has been on adequate anticoagulation.  The patient received Lidocaine 40 mg IV followed by propofol 70 mg IV  for sedation.  Synchronous cardioversion was performed at 120  joules.  The cardioversion was successful     Complications: No apparent complications Patient did tolerate procedure well.   Thayer Headings, Brooke Bonito., MD, [facility] 07/18/2017, 11:56 AM

## 2017-07-18 NOTE — H&P (View-Only) (Signed)
Patient Care Team: [name], MD as PCP - General (Endocrinology)   HPI  Kayla Harrison is a 71 y.o. female Seen in followup for a pacemaker implanted at [facility] and atrial fibrillation.  She underwent pulmonary vein isolation at [facility] and had recurrent atrial fibrillation and underwent convergent ablation at [facility] 1/02; it was complicated by heart block prompting pacemaker implantation.   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 She was not tolerated  NOACs and remains on warfarin  She has had progressive shortness of breath. This is with modest exertion. She is able to lie flat. She has no peripheral edema.          Date          TSH      ALT Hgb     8/18           0.2       15 11.9     9/18                     9.3   WBC 1.5   Echo 2011 EF 55% with mod LAE-68m  Echo 7/14 normal EF Echo 5/18 normal EF Echo 9/18 normal LV function  mitral annular  Calcification unchanged from 5/18   Chest x-ray Personally reviewed  infiltrates described as post radiation changes   She also has a history of morbid obesity status post lap band surgery    Past Medical History:  Diagnosis Date  . Abnormally small mouth    per pt "I have a small mouth"  . Arthritis    osteoarthritis - knees   . Atrial fibrillation ([facility]    [facility]- [facility]-- [facility]  . [place] ([place]    Dr SMargot Chimes total thyroidectomy- 1999- for cancer  . Colon polyp    Dr MEarlean Shawl . Complication of anesthesia    Ketamine produces LSD reaction, bright colored nightmarish experience   . Dyslipidemia   . Dysrhythmia    a fib with internal/external  ablation, currently NSR  . H/O pleural effusion    s/p thoracentesis w 32072mwithdrawn  . Hepatitis    [place] ?hepatitis   . History of dysphagia    due to radiation therapy  . History of kidney stones   . Hx of thyroid cancer    Dr SoForde Dandy. Hyperlipidemia   . Hypothyroidism   . [place] [place]   radiation RX completed 03/04/17; will start chemo 6/27  . Morbid obesity ([place]   Status post lap band surgery  . Normal cardiac stress test    2008- cardiac stress/echo  . Pacemaker-Medtronic   . [facility]   . Renal calculi   . Right shoulder pain    needs shoulder replacement  . [place]us [place]   Complicating convergent ablation 6/14  . [facility]   [place]  . Wears glasses  readers    Past Surgical History:  Procedure Laterality Date  . ABDOMINAL HYSTERECTOMY  1983  . afib ablation     X 2; [facility] & Dr Caryl Comes  . APPENDECTOMY    . BONE MARROW BIOPSY  02/21/2017  . BREAST LUMPECTOMY  2010  . bso  1998  . CARDIOVERSION  10/09/2012   Procedure: CARDIOVERSION;  Surgeon: Minus Breeding, MD;  Location: [place];  Service: Cardiovascular;  Laterality: N/A;  . CARDIOVERSION  10/09/2012   Procedure: CARDIOVERSION;  Surgeon: Minus Breeding, MD;  Location[facility] ENDOSCOPY;  Service: Cardiovascular;  Laterality: N/A;  [name] gave the ok to add pt to the add on , but we must check to find out if the can add pt on at [phone] ( [phone])  . CARDIOVERSION N/A 11/20/2012   Procedure: CARDIOVERSION;  Surgeon: Fay Records, MD;  [facility];  Service: Cardiovascular;  Laterality: N/A;  . CHOLECYSTECTOMY    . COLONOSCOPY W/ POLYPECTOMY     Dr Earlean Shawl  . convergent    . CYSTOSCOPY N/A 02/06/2015   Procedure: CYSTOSCOPY;  Surgeon: Kathie Rhodes, MD;  [facility];  Service: Urology;  Laterality: N/A;  . CYSTOSCOPY WITH RETROGRADE PYELOGRAM, URETEROSCOPY AND  STENT PLACEMENT Right 02/06/2015   Procedure: RETROGRADE PYELOGRAM, RIGHT URETEROSCOPY STENT PLACEMENT;  Surgeon: Kathie Rhodes, MD;  [facility];  Service: Urology;  Laterality: Right;  . CYSTOSCOPY WITH RETROGRADE PYELOGRAM, URETEROSCOPY AND STENT PLACEMENT Right 03/07/2017   Procedure: CYSTOSCOPY WITH RIGHT RETROGRADE PYELOGRAM,RIGHT  URETEROSCOPYLASER LITHOTRIPSY  AND STENT PLACEMENT AND STONE BASKETRY;  Surgeon: Kathie Rhodes, MD;  Location: [place];  Service: Urology;  Laterality: Right;  . EYE SURGERY     cataract surgery  . HOLMIUM LASER APPLICATION N/A 3/66/4403   Procedure: HOLMIUM LASER APPLICATION;  Surgeon: Kathie Rhodes, MD;  [facility];  Service: Urology;  Laterality: N/A;  . HOLMIUM LASER APPLICATION Right 4/74/2595   Procedure: HOLMIUM LASER APPLICATION;  Surgeon: Kathie Rhodes, MD;  Location: [facility];  Service: Urology;  Laterality: Right;  . IR FLUORO GUIDE PORT INSERTION RIGHT  02/24/2017  . IR PATIENT EVAL TECH 0-60 MINS  03/11/2017  . IR US GUIDE VASC ACCESS RIGHT  02/24/2017  . JOINT REPLACEMENT  04/13/12   Right Knee replacement  . KNEE ARTHROSCOPY     bilateral  . LAPAROSCOPIC GASTRIC BANDING  07/10/2010  . LAPAROTOMY     for ruptured ovary and ovarian artery   . PACEMAKER INSERTION  03/10/13   [facility]  . POCKET REVISION N/A 12/08/2013   Procedure: POCKET REVISION;  Surgeon: Deboraha Sprang, MD;  Location: [facility] CATH LAB;  Service: Cardiovascular;  Laterality: N/A;  . THYROIDECTOMY  1998   Dr Margot Chimes  . TONSILLECTOMY    . TOTAL KNEE ARTHROPLASTY  04/13/2012   Procedure: TOTAL KNEE ARTHROPLASTY;  Surgeon: Rudean Haskell, MD;  Location: [place];  Service: Orthopedics;  Laterality: Right;  . Transabdominal ablation  03/02/13   [facility]  . VIDEO BRONCHOSCOPY WITH ENDOBRONCHIAL ULTRASOUND N/A 02/07/2017   Procedure: VIDEO BRONCHOSCOPY WITH ENDOBRONCHIAL ULTRASOUND;  Surgeon: Marshell Garfinkel, MD;  Location: [place];  Service: Pulmonary;   Laterality: N/A;    Current Outpatient Prescriptions  Medication Sig Dispense Refill  . acetaminophen (TYLENOL 8 HOUR) 650 MG CR tablet Take 1 tablet (650 mg total) by mouth every 8 (eight) hours as needed for pain. 30 tablet 0  . allopurinol (ZYLOPRIM) 300 MG tablet Take 1 tablet (300 mg total)  by mouth daily. 30 tablet 3  . amiodarone (PACERONE) 200 MG tablet Take 1 tablet (200 mg total) by mouth daily. (Patient taking differently: Take 400 mg by mouth daily. ) 90 tablet 3  . apixaban (ELIQUIS) 5 MG TABS tablet Take 1 tablet (5 mg total) by mouth 2 (two) times daily. 180 tablet 1  . cycloSPORINE (RESTASIS) 0.05 % ophthalmic emulsion Place 1 drop into both eyes 2 (two) times daily.    Marland Kitchen dicyclomine (BENTYL) 20 MG tablet Take 1 tablet (20 mg total) by mouth every 6 (six) hours. As needed 60 tablet 1  . diphenhydramine-acetaminophen (TYLENOL PM) 25-500 MG TABS tablet Take 1 tablet by mouth at bedtime as needed.    . diphenoxylate-atropine (LOMOTIL) 2.5-0.025 MG tablet TAKE 2 TABLETS BY MOUTH TWICE DAILY AS NEEDED FOR DIARRHEA OR LOOSE STOOL 30 tablet 0  . furosemide (LASIX) 40 MG tablet Take 40 mg by mouth daily as needed for fluid.     Marland Kitchen levothyroxine (SYNTHROID, LEVOTHROID) 175 MCG tablet Take 175 mcg by mouth daily before breakfast.    . lidocaine-prilocaine (EMLA) cream Apply to affected area once 30 g 3  . Lifitegrast (XIIDRA) 5 % SOLN Apply 1 drop to eye 2 (two) times daily.    Marland Kitchen LORazepam (ATIVAN) 0.5 MG tablet Take 1 tablet (0.5 mg total) by mouth at bedtime. 30 tablet 0  . Multiple Vitamin (MULTIVITAMIN) tablet Take 1 tablet by mouth daily.     . ondansetron (ZOFRAN) 8 MG tablet Take 1 tablet (8 mg total) by mouth 2 (two) times daily as needed for refractory nausea / vomiting. Start on day 3 after cyclophosphamide chemotherapy. 30 tablet 1  . Oxycodone HCl 10 MG TABS Take 1 tablet (10 mg total) by mouth every 4 (four) hours as needed. 20 tablet 0  . predniSONE (DELTASONE) 10 MG tablet 30  mg QD x 4 days, 20 mg QD x 4 days, 10 mg x 4 days, 5 mg QD x 5 days, then stop 26 tablet 0  . predniSONE (DELTASONE) 20 MG tablet Take 3 tablets (60 mg total) by mouth daily. Take on days 1-5 of chemotherapy. 30 tablet 3  . sucralfate (CARAFATE) 1 g tablet Take 1 tablet (1 g total) by mouth 4 (four) times daily. 120 tablet 2   No current facility-administered medications for this visit.     Allergies  Allergen Reactions  . Tikosyn [Dofetilide] Other (See Comments)    Prolonged QT interval Prolonged QT interval  . Xarelto [Rivaroxaban] Other (See Comments)    Nose Bleed X 6 hrs ; packing in ER  . Epinephrine Other (See Comments) and Rash    Dental form only (liquid). Patient stated she will become "out of it" she can hear you but cannot respond in normal fashion.  Marland Kitchen Ketamine     Hallucinations  . Adhesive [Tape] Rash    Paper tape as well    Review of Systems negative except from HPI and PMH  Physical Exam BP 126/74   Pulse 80   Ht _0  (1.676 m)   Wt 220 lb (99.8 kg)   SpO2 95%   BMI 35.51 kg/m  Well developed and nourished Moderate shortness of breath and discomfort sitting in wheelchair  HENT normal Neck supple with JVP-8-10 Chest wall engorgement but no arm vein engorgementts Clear RRR  Abd-soft with active BS without hepatomegaly No Clubbing cyanosis  No edema Skin-warm and dry A & Oriented  Grossly normal sensory and motor function  ECG was ordered today sinus 78 intervals 18/09/42   Assessment and  Plan  Atrial fibrillation  S/p convergent ablation paroxysmal  Prior Strokes  Pacemaker Medtronic MRI compatible   Labile INR  Non-Hodgkin's lymphoma on chemotherapy  Dyspnea on exertion     Treated hypothyroidism  High Risk Medication Surveillance   Her shortness of breath worsening in the absence of evidence of volume overload and with her albumin been relatively normal raises for me the concern that this could be amiodarone lung injury. Differential  is broad. Her chest x-ray is notably abnormal. I don't think she has congestive heart failure. I will be in touch with her oncologist to see whether they think pulmonary input would be valuable  Ambulation-sustained O2 sats greater than 93  In the interim we will discontinue her amiodarone  More than 50% of 40 min was spent in counseling related to the above

## 2017-07-18 NOTE — Transfer of Care (Signed)
Immediate Anesthesia Transfer of Care Note  Patient: Sherolyn Buba Shorts  Procedure(s) Performed: CARDIOVERSION (N/A )  Patient Location: Endoscopy Unit  Anesthesia Type:General  Level of Consciousness: awake, alert  and oriented  Airway & Oxygen Therapy: Patient Spontanous Breathing  Post-op Assessment: Report given to RN and Post -op Vital signs reviewed and stable  Post vital signs: Reviewed and stable  Last Vitals:  Vitals:   07/18/17 1121 07/18/17 1122  BP:    Pulse: 88 88  Resp: (!) 37 (!) 22  Temp:    SpO2: (!) 89% (!) 88%    Last Pain:  Vitals:   07/18/17 0917  TempSrc: Oral         Complications: No apparent anesthesia complications

## 2017-07-19 ENCOUNTER — Encounter (HOSPITAL_COMMUNITY): Payer: Self-pay | Admitting: Cardiovascular Disease

## 2017-07-19 DIAGNOSIS — G629 Polyneuropathy, unspecified: Secondary | ICD-10-CM | POA: Diagnosis not present

## 2017-07-19 DIAGNOSIS — E860 Dehydration: Secondary | ICD-10-CM | POA: Diagnosis not present

## 2017-07-19 DIAGNOSIS — C8522 Mediastinal (thymic) large B-cell lymphoma, intrathoracic lymph nodes: Secondary | ICD-10-CM | POA: Diagnosis not present

## 2017-07-19 DIAGNOSIS — C383 Malignant neoplasm of mediastinum, part unspecified: Secondary | ICD-10-CM | POA: Diagnosis not present

## 2017-07-19 DIAGNOSIS — R0602 Shortness of breath: Secondary | ICD-10-CM | POA: Diagnosis not present

## 2017-07-21 ENCOUNTER — Ambulatory Visit (HOSPITAL_COMMUNITY)
Admission: RE | Admit: 2017-07-21 | Discharge: 2017-07-21 | Disposition: A | Discharge status: Home / Self Care | Payer: Medicare Other | Source: Ambulatory Visit | Attending: Nurse Practitioner | Admitting: Nurse Practitioner

## 2017-07-21 ENCOUNTER — Encounter (HOSPITAL_COMMUNITY): Payer: Self-pay | Admitting: Nurse Practitioner

## 2017-07-21 VITALS — BP 118/74 | HR 92

## 2017-07-21 DIAGNOSIS — Z87442 Personal history of urinary calculi: Secondary | ICD-10-CM | POA: Diagnosis not present

## 2017-07-21 DIAGNOSIS — Z853 Personal history of malignant neoplasm of breast: Secondary | ICD-10-CM | POA: Diagnosis not present

## 2017-07-21 DIAGNOSIS — Z79899 Other long term (current) drug therapy: Secondary | ICD-10-CM | POA: Diagnosis not present

## 2017-07-21 DIAGNOSIS — M25511 Pain in right shoulder: Secondary | ICD-10-CM | POA: Diagnosis not present

## 2017-07-21 DIAGNOSIS — E890 Postprocedural hypothyroidism: Secondary | ICD-10-CM | POA: Insufficient documentation

## 2017-07-21 DIAGNOSIS — M179 Osteoarthritis of knee, unspecified: Secondary | ICD-10-CM | POA: Insufficient documentation

## 2017-07-21 DIAGNOSIS — Z923 Personal history of irradiation: Secondary | ICD-10-CM | POA: Insufficient documentation

## 2017-07-21 DIAGNOSIS — Z8673 Personal history of transient ischemic attack (TIA), and cerebral infarction without residual deficits: Secondary | ICD-10-CM | POA: Diagnosis not present

## 2017-07-21 DIAGNOSIS — E785 Hyperlipidemia, unspecified: Secondary | ICD-10-CM | POA: Insufficient documentation

## 2017-07-21 DIAGNOSIS — Z85118 Personal history of other malignant neoplasm of bronchus and lung: Secondary | ICD-10-CM | POA: Diagnosis not present

## 2017-07-21 DIAGNOSIS — I4892 Unspecified atrial flutter: Secondary | ICD-10-CM | POA: Diagnosis not present

## 2017-07-21 DIAGNOSIS — C8332 Diffuse large B-cell lymphoma, intrathoracic lymph nodes: Secondary | ICD-10-CM | POA: Diagnosis not present

## 2017-07-21 DIAGNOSIS — Z8249 Family history of ischemic heart disease and other diseases of the circulatory system: Secondary | ICD-10-CM | POA: Diagnosis not present

## 2017-07-21 DIAGNOSIS — Z9221 Personal history of antineoplastic chemotherapy: Secondary | ICD-10-CM | POA: Diagnosis not present

## 2017-07-21 DIAGNOSIS — Z950 Presence of cardiac pacemaker: Secondary | ICD-10-CM | POA: Insufficient documentation

## 2017-07-21 DIAGNOSIS — Z9884 Bariatric surgery status: Secondary | ICD-10-CM | POA: Diagnosis not present

## 2017-07-21 DIAGNOSIS — Z888 Allergy status to other drugs, medicaments and biological substances status: Secondary | ICD-10-CM | POA: Insufficient documentation

## 2017-07-21 DIAGNOSIS — Z85850 Personal history of malignant neoplasm of thyroid: Secondary | ICD-10-CM | POA: Diagnosis not present

## 2017-07-21 DIAGNOSIS — Z833 Family history of diabetes mellitus: Secondary | ICD-10-CM | POA: Diagnosis not present

## 2017-07-21 DIAGNOSIS — Z96651 Presence of right artificial knee joint: Secondary | ICD-10-CM | POA: Diagnosis not present

## 2017-07-21 DIAGNOSIS — I4891 Unspecified atrial fibrillation: Secondary | ICD-10-CM | POA: Diagnosis not present

## 2017-07-21 DIAGNOSIS — Z7901 Long term (current) use of anticoagulants: Secondary | ICD-10-CM | POA: Diagnosis not present

## 2017-07-21 DIAGNOSIS — Z800 Family history of malignant neoplasm of digestive organs: Secondary | ICD-10-CM | POA: Insufficient documentation

## 2017-07-21 NOTE — Progress Notes (Signed)
Primary Care Physician: [name], MD Referring Physician: Dr. [name] is a 71 y.o. female with a h/o large superior mediastinal mass contiguous with right hilar region encasing the trachea and right mainstem bronchus measuring 5.4 x 7.5 x 6 cm causing SVC obstruction,  Undergoing chemotherapy on R [facility] chemotherapy with multiple side effects,including diarrhea, nausea, dehydration, fatigue and cytopenias. These continue to be a problem. Complains of fogginess in the head, fatigue that lasts for a week after a treatment. She also had more afib burden with start of chemo and Dr. Caryl Comes has her on 400 mg amiodarone daily now until finished with chemo and hopes to stop drug in November.   She has also had issues with dizziness with change of position and when standing for a while and Dr. Caryl Comes wanted orthostatic's done today.BP lying 126/62, HR 76, sitting 106/72, HR 81, standing 94/56 HR 87. She is staying hydrated. She is not on any meds that could be adjusted to help with this. Has lasix on her med list, but is not using. She is pending chest CT.   F/u in afib clinic, 10/29. Pt had amiodarone stopped in September for c/o of shortness of breath that he thought may be secondary to afib. Shortly after that, the pt developed aflutter and just had successful cardioversion,10/26. She is in the afib clinc for f/u today,and she is continuing in [place]. She continues with shortness of breath and finished her chemo just in the last few days.  Today, she denies symptoms of palpitations, chest pain, shortness of breath, orthopnea, PND, lower extremity edema,  presyncope, syncope, or neurologic sequela. Dizziness with standing. The patient is tolerating medications without difficulties and is otherwise without complaint today.   Past Medical History:  Diagnosis Date  . Abnormally small mouth    per pt "I have a small mouth"  . Arthritis    osteoarthritis - knees   . Atrial fibrillation  [facility])    ablation- 2x's-- 1st time- [facility], 2nd event at [facility] in 2008. Convergent ablation at [facility] 6/14  . Breast cancer ([place])    Dr Margot Chimes, total thyroidectomy- 1999- for cancer  . Colon polyp    Dr Earlean Shawl  . Complication of anesthesia    Ketamine produces LSD reaction, bright colored nightmarish experience   . Dyslipidemia   . Dysrhythmia    a fib with internal/external ablation, currently NSR  . H/O pleural effusion    s/p thoracentesis w 3259m withdrawn  . Hepatitis    [place] ?hepatitis   . History of dysphagia    due to radiation therapy  . History of kidney stones   . Hx of thyroid cancer    Dr SForde Dandy . Hyperlipidemia   . Hypothyroidism   . [place]   radiation RX completed 03/04/17; will start chemo 6/27  . Morbid obesity ([place]    Status post lap band surgery  . Normal cardiac stress test    2008- cardiac stress/echo  . Pacemaker-Medtronic   . [facility]onia [facility]   . Renal calculi   . Right shoulder pain    needs shoulder replacement  . Sinus node dysfunction ([place]    Complicating convergent ablation 6/14  . Stroke ([facility]    [place]  . Wears glasses    readers   Past Surgical History:  Procedure Laterality  Date  . ABDOMINAL HYSTERECTOMY  1983  . afib ablation     X 2; [facility] & Dr Graciela Husbands  . APPENDECTOMY    . BONE MARROW BIOPSY  02/21/2017  . BREAST LUMPECTOMY  2010  . bso  1998  . CARDIOVERSION  10/09/2012   Procedure: CARDIOVERSION;  Surgeon: Rollene Rotunda, MD;  Location[facility] OR;  Service: Cardiovascular;  Laterality: N/A;  . CARDIOVERSION  10/09/2012   Procedure: CARDIOVERSION;  Surgeon: Rollene Rotunda, MD;  Location: [facility] ENDOSCOPY;  Service: Cardiovascular;  Laterality: N/A;  Kathlene November gave the ok to add pt to the add on , but we must check to find out if the can add pt on at 1400 ( [phone])  . CARDIOVERSION N/A 11/20/2012   Procedure:  CARDIOVERSION;  Surgeon: Pricilla Riffle, MD;  Location: [facility] ENDOSCOPY;  Service: Cardiovascular;  Laterality: N/A;  . CARDIOVERSION N/A 07/18/2017   Procedure: CARDIOVERSION;  Surgeon: Vesta Mixer, MD;  Location: [facility] ENDOSCOPY;  Service: Cardiovascular;  Laterality: N/A;  . CHOLECYSTECTOMY    . COLONOSCOPY W/ POLYPECTOMY     Dr Kinnie Scales  . convergent    . CYSTOSCOPY N/A 02/06/2015   Procedure: CYSTOSCOPY;  Surgeon: Ihor Gully, MD;  [facility];  Service: Urology;  Laterality: N/A;  . CYSTOSCOPY WITH RETROGRADE PYELOGRAM, URETEROSCOPY AND STENT PLACEMENT Right 02/06/2015   Procedure: RETROGRADE PYELOGRAM, RIGHT URETEROSCOPY STENT PLACEMENT;  Surgeon: Ihor Gully, MD;  [facility];  Service: Urology;  Laterality: Right;  . CYSTOSCOPY WITH RETROGRADE PYELOGRAM, URETEROSCOPY AND STENT PLACEMENT Right 03/07/2017   Procedure: CYSTOSCOPY WITH RIGHT RETROGRADE PYELOGRAM,RIGHT  URETEROSCOPYLASER LITHOTRIPSY  AND STENT PLACEMENT AND STONE BASKETRY;  Surgeon: Ihor Gully, MD;  Location: [facility];  Service: Urology;  Laterality: Right;  . EYE SURGERY     cataract surgery  . HOLMIUM LASER APPLICATION N/A 02/06/2015   Procedure: HOLMIUM LASER APPLICATION;  Surgeon: Ihor Gully, MD;  [facility];  Service: Urology;  Laterality: N/A;  . HOLMIUM LASER APPLICATION Right 03/07/2017   Procedure: HOLMIUM LASER APPLICATION;  Surgeon: Ihor Gully, MD;  Location: [facility];  Service: Urology;  Laterality: Right;  . IR FLUORO GUIDE PORT INSERTION RIGHT  02/24/2017  . IR PATIENT EVAL TECH 0-60 MINS  03/11/2017  . IR US GUIDE VASC ACCESS RIGHT  02/24/2017  . JOINT REPLACEMENT  04/13/12   Right Knee replacement  . KNEE ARTHROSCOPY     bilateral  . LAPAROSCOPIC GASTRIC BANDING  07/10/2010  . LAPAROTOMY     for ruptured ovary and ovarian artery   . PACEMAKER INSERTION  03/10/13   [facility]  . POCKET REVISION N/A 12/08/2013   Procedure: POCKET REVISION;  Surgeon: Duke Salvia, MD;  Location: [facility] CATH LAB;  Service: Cardiovascular;  Laterality: N/A;  . THYROIDECTOMY  1998   Dr Jamey Ripa  . TONSILLECTOMY    . TOTAL KNEE ARTHROPLASTY  04/13/2012   Procedure: TOTAL KNEE ARTHROPLASTY;  Surgeon: Raymon Mutton, MD;  [facility];  Service: Orthopedics;  Laterality: Right;  . Transabdominal ablation  03/02/13   [facility]  . VIDEO BRONCHOSCOPY WITH ENDOBRONCHIAL ULTRASOUND N/A 02/07/2017   Procedure: VIDEO BRONCHOSCOPY WITH ENDOBRONCHIAL ULTRASOUND;  Surgeon: Chilton Greathouse, MD;  Location: [facility] OR;  Service: Pulmonary;  Laterality: N/A;    Current Outpatient Prescriptions  Medication Sig Dispense Refill  . acetaminophen (TYLENOL 8 HOUR) 650 MG CR tablet Take 1 tablet (650 mg total) by mouth every 8 (eight) hours as needed for pain. 30  tablet 0  . allopurinol (ZYLOPRIM) 300 MG tablet Take 1 tablet (300 mg total) by mouth daily. 30 tablet 3  . apixaban (ELIQUIS) 5 MG TABS tablet Take 1 tablet (5 mg total) by mouth 2 (two) times daily. 180 tablet 1  . cycloSPORINE (RESTASIS) 0.05 % ophthalmic emulsion Place 1 drop into both eyes 2 (two) times daily.    Marland Kitchen dicyclomine (BENTYL) 20 MG tablet Take 1 tablet (20 mg total) by mouth every 6 (six) hours. As needed 60 tablet 1  . diphenhydramine-acetaminophen (TYLENOL PM) 25-500 MG TABS tablet Take 1 tablet by mouth at bedtime as needed.    . diphenoxylate-atropine (LOMOTIL) 2.5-0.025 MG tablet TAKE 2 TABLETS BY MOUTH TWICE DAILY AS NEEDED FOR DIARRHEA OR LOOSE STOOL 30 tablet 0  . furosemide (LASIX) 40 MG tablet Take 40 mg by mouth daily as needed for fluid.     Marland Kitchen HYDROcodone-homatropine (HYCODAN) 5-1.5 MG/5ML syrup Take 5 mLs by mouth every 6 (six) hours as needed for cough. 120 mL 0  . levofloxacin (LEVAQUIN) 750 MG tablet Take 1 tablet (750 mg total) by mouth daily. 7 tablet 0  . levothyroxine (SYNTHROID, LEVOTHROID) 175 MCG tablet Take 175 mcg by mouth daily before breakfast.    . lidocaine-prilocaine (EMLA) cream Apply to  affected area once 30 g 3  . Lifitegrast (XIIDRA) 5 % SOLN Apply 1 drop to eye 2 (two) times daily.    Marland Kitchen LORazepam (ATIVAN) 0.5 MG tablet Take 1 tablet (0.5 mg total) by mouth at bedtime. 30 tablet 0  . Multiple Vitamin (MULTIVITAMIN) tablet Take 1 tablet by mouth daily.     . ondansetron (ZOFRAN) 8 MG tablet Take 1 tablet (8 mg total) by mouth 2 (two) times daily as needed for refractory nausea / vomiting. Start on day 3 after cyclophosphamide chemotherapy. 30 tablet 1  . Oxycodone HCl 10 MG TABS Take 1 tablet (10 mg total) by mouth every 4 (four) hours as needed. 20 tablet 0  . predniSONE (DELTASONE) 10 MG tablet 30 mg QD x 4 days, 20 mg QD x 4 days, 10 mg x 4 days, 5 mg QD x 5 days, then stop 26 tablet 0  . predniSONE (DELTASONE) 20 MG tablet Take 3 tablets (60 mg total) by mouth daily. Take on days 1-5 of chemotherapy. 30 tablet 3  . sucralfate (CARAFATE) 1 g tablet Take 1 tablet (1 g total) by mouth 4 (four) times daily. 120 tablet 2   No current facility-administered medications for this encounter.     Allergies  Allergen Reactions  . Tikosyn [Dofetilide] Other (See Comments)    Prolonged QT interval Prolonged QT interval  . Xarelto [Rivaroxaban] Other (See Comments)    Nose Bleed X 6 hrs ; packing in ER  . Epinephrine Other (See Comments) and Rash    Dental form only (liquid). Patient stated she will become "out of it" she can hear you but cannot respond in normal fashion.  Marland Kitchen Ketamine     Hallucinations  . Adhesive [Tape] Rash    Paper tape as well    Social History   Social History  . Marital status: Single    Spouse name: N/A  . Number of children: 0  . Years of education: N/A   Occupational History  . EXCE[employer]IVE [employer] Retired   Social History Main Topics  . Smoking status: Never Smoker  . Smokeless tobacco: Never Used  . Alcohol use No  . Drug use: No  .  Sexual activity: Not on file   Other Topics Concern  . Not on file   Social  History Narrative   SINGLE    NO CHILDREN   NEVER SMOKED   EXERCISE 3 X WK          Family History  Problem Relation Age of Onset  . Heart disease Father 48       MI '@autopsy'$   . Colon cancer Father        COLON  . Heart attack Father   . Diabetes Sister   . Diabetes Brother   . Diabetes Paternal Aunt   . Diabetes Paternal Grandmother     ROS- All systems are reviewed and negative except as per the HPI above  Physical Exam: Vitals:   07/21/17 1044  BP: 118/74  Pulse: 92  SpO2: 93%   Wt Readings from Last 3 Encounters:  07/18/17 215 lb (97.5 kg)  07/17/17 215 lb (97.5 kg)  07/16/17 216 lb 12.8 oz (98.3 kg)    Labs: Lab Results  Component Value Date   NA 140 07/16/2017   K 4.2 07/16/2017   CL 106 02/08/2017   CO2 21 (L) 07/16/2017   GLUCOSE 150 (H) 07/16/2017   BUN 19.2 07/16/2017   CREATININE 1.0 07/16/2017   CALCIUM 8.9 07/16/2017   PHOS 3.9 04/19/2013   MG 1.9 06/11/2017   Lab Results  Component Value Date   INR 2.2 04/28/2017   Lab Results  Component Value Date   CHOL 200 07/26/2013   HDL 51.80 07/26/2013   LDLCALC 116 (H) 07/26/2013   TRIG 162.0 (H) 07/26/2013     GEN- The patient is well appearing, alert and oriented x 3 today.   Head- normocephalic, atraumatic Eyes-  Sclera clear, conjunctiva pink Ears- hearing intact Oropharynx- clear Neck- supple, no JVP Lymph- no cervical lymphadenopathy Lungs- Clear to ausculation bilaterally, normal work of breathing Heart- Regular rate and rhythm, no murmurs, rubs or gallops, PMI not laterally displaced GI- soft, NT, ND, + BS Extremities- no clubbing, cyanosis, or edema MS- no significant deformity or atrophy Skin- no rash or lesion Psych- euthymic mood, full affect Neuro- strength and sensation are intact  EKG- NSR, normal EKG, 90 bpm, pr int 178 ms, qrs int 84 ms, qtc 469 ms Epic records reviewed    Assessment and Plan: 1. Afib/flutter Staying in SR since cardioversion 10/26 Now off  amiodarone for worseing shortness of breath Continue on eliquis 5 mg bid  2. 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  F/u with Dr. Caryl Comes as scheduled in December  Barnell Shieh C. Zonnie Landen, [facility] [address] [place], [place] [address] [phone]

## 2017-07-23 DIAGNOSIS — C383 Malignant neoplasm of mediastinum, part unspecified: Secondary | ICD-10-CM | POA: Diagnosis not present

## 2017-07-23 DIAGNOSIS — R0602 Shortness of breath: Secondary | ICD-10-CM | POA: Diagnosis not present

## 2017-07-23 DIAGNOSIS — E860 Dehydration: Secondary | ICD-10-CM | POA: Diagnosis not present

## 2017-07-23 DIAGNOSIS — C8522 Mediastinal (thymic) large B-cell lymphoma, intrathoracic lymph nodes: Secondary | ICD-10-CM | POA: Diagnosis not present

## 2017-07-23 DIAGNOSIS — G629 Polyneuropathy, unspecified: Secondary | ICD-10-CM | POA: Diagnosis not present

## 2017-07-25 DIAGNOSIS — G629 Polyneuropathy, unspecified: Secondary | ICD-10-CM | POA: Diagnosis not present

## 2017-07-25 DIAGNOSIS — C383 Malignant neoplasm of mediastinum, part unspecified: Secondary | ICD-10-CM | POA: Diagnosis not present

## 2017-07-25 DIAGNOSIS — R0602 Shortness of breath: Secondary | ICD-10-CM | POA: Diagnosis not present

## 2017-07-25 DIAGNOSIS — E860 Dehydration: Secondary | ICD-10-CM | POA: Diagnosis not present

## 2017-07-25 DIAGNOSIS — C8522 Mediastinal (thymic) large B-cell lymphoma, intrathoracic lymph nodes: Secondary | ICD-10-CM | POA: Diagnosis not present

## 2017-07-29 ENCOUNTER — Other Ambulatory Visit: Payer: Medicare Other

## 2017-07-29 ENCOUNTER — Ambulatory Visit (HOSPITAL_BASED_OUTPATIENT_CLINIC_OR_DEPARTMENT_OTHER): Payer: Medicare Other | Admitting: Hematology and Oncology

## 2017-07-29 ENCOUNTER — Ambulatory Visit (HOSPITAL_COMMUNITY)
Admission: RE | Admit: 2017-07-29 | Discharge: 2017-07-29 | Disposition: A | Discharge status: Home / Self Care | Payer: Medicare Other | Source: Ambulatory Visit | Attending: Hematology and Oncology | Admitting: Hematology and Oncology

## 2017-07-29 ENCOUNTER — Other Ambulatory Visit: Payer: Self-pay

## 2017-07-29 ENCOUNTER — Ambulatory Visit (HOSPITAL_BASED_OUTPATIENT_CLINIC_OR_DEPARTMENT_OTHER): Payer: Medicare Other

## 2017-07-29 ENCOUNTER — Other Ambulatory Visit (HOSPITAL_BASED_OUTPATIENT_CLINIC_OR_DEPARTMENT_OTHER): Payer: Medicare Other

## 2017-07-29 VITALS — BP 132/64 | HR 97 | Temp 98.5°F | Resp 18 | Ht 66.0 in | Wt 213.5 lb

## 2017-07-29 DIAGNOSIS — R0602 Shortness of breath: Secondary | ICD-10-CM | POA: Diagnosis not present

## 2017-07-29 DIAGNOSIS — C8522 Mediastinal (thymic) large B-cell lymphoma, intrathoracic lymph nodes: Secondary | ICD-10-CM

## 2017-07-29 DIAGNOSIS — C383 Malignant neoplasm of mediastinum, part unspecified: Secondary | ICD-10-CM

## 2017-07-29 DIAGNOSIS — Z85850 Personal history of malignant neoplasm of thyroid: Secondary | ICD-10-CM

## 2017-07-29 DIAGNOSIS — D649 Anemia, unspecified: Secondary | ICD-10-CM | POA: Insufficient documentation

## 2017-07-29 DIAGNOSIS — J849 Interstitial pulmonary disease, unspecified: Secondary | ICD-10-CM

## 2017-07-29 LAB — COMPREHENSIVE METABOLIC PANEL
ALT: 10 U/L (ref 0–55)
AST: 13 U/L (ref 5–34)
Albumin: 2.6 g/dL — ABNORMAL LOW (ref 3.5–5.0)
Alkaline Phosphatase: 81 U/L (ref 40–150)
Anion Gap: 6 mEq/L (ref 3–11)
BILIRUBIN TOTAL: 0.43 mg/dL (ref 0.20–1.20)
BUN: 16.9 mg/dL (ref 7.0–26.0)
CO2: 26 meq/L (ref 22–29)
Calcium: 8.8 mg/dL (ref 8.4–10.4)
Chloride: 107 mEq/L (ref 98–109)
Creatinine: 0.9 mg/dL (ref 0.6–1.1)
GLUCOSE: 160 mg/dL — AB (ref 70–140)
Potassium: 4.4 mEq/L (ref 3.5–5.1)
SODIUM: 138 meq/L (ref 136–145)
TOTAL PROTEIN: 5.9 g/dL — AB (ref 6.4–8.3)

## 2017-07-29 LAB — CBC WITH DIFFERENTIAL/PLATELET
BASO%: 0.7 % (ref 0.0–2.0)
BASOS ABS: 0.1 10*3/uL (ref 0.0–0.1)
EOS%: 0.3 % (ref 0.0–7.0)
Eosinophils Absolute: 0 10*3/uL (ref 0.0–0.5)
HEMATOCRIT: 27.4 % — AB (ref 34.8–46.6)
HEMOGLOBIN: 8.4 g/dL — AB (ref 11.6–15.9)
LYMPH#: 0.9 10*3/uL (ref 0.9–3.3)
LYMPH%: 12.2 % — ABNORMAL LOW (ref 14.0–49.7)
MCH: 31 pg (ref 25.1–34.0)
MCHC: 30.7 g/dL — ABNORMAL LOW (ref 31.5–36.0)
MCV: 101.1 fL — AB (ref 79.5–101.0)
MONO#: 0.1 10*3/uL (ref 0.1–0.9)
MONO%: 1.3 % (ref 0.0–14.0)
NEUT#: 6.1 10*3/uL (ref 1.5–6.5)
NEUT%: 85.5 % — AB (ref 38.4–76.8)
PLATELETS: 165 10*3/uL (ref 145–400)
RBC: 2.71 10*6/uL — ABNORMAL LOW (ref 3.70–5.45)
RDW: 17.7 % — ABNORMAL HIGH (ref 11.2–14.5)
WBC: 7.1 10*3/uL (ref 3.9–10.3)
nRBC: 1 % — ABNORMAL HIGH (ref 0–0)

## 2017-07-29 LAB — PREPARE RBC (CROSSMATCH)

## 2017-07-29 LAB — ABO/RH: ABO/RH(D): O POS

## 2017-07-29 MED ORDER — HEPARIN SOD (PORK) LOCK FLUSH 100 UNIT/ML IV SOLN
500.0000 [IU] | Freq: Once | INTRAVENOUS | Status: AC
  Administered 2017-07-29: 500 [IU]
  Filled 2017-07-29: qty 5

## 2017-07-29 MED ORDER — SODIUM CHLORIDE 0.9% FLUSH
10.0000 mL | Freq: Once | INTRAVENOUS | Status: AC
  Administered 2017-07-29: 10 mL
  Filled 2017-07-29: qty 10

## 2017-07-29 NOTE — Assessment & Plan Note (Signed)
02/03/2017: Large superior mediastinal mass contiguous with right hilar region encasing the trachea and right mainstem bronchus measuring 5.4 x 7.5 x 6 cm causing SVC obstruction, additional mediastinal lymph nodes subcarinal 1.6 cm and left hilar 1 cm; multiple small pulmonary nodules 4-6 mm   Bronchoscopy and biopsy 02/09/2017: Monoclonal B-cell population expressing CD10 consistent with non-Hodgkin's B-cell lymphoma germinal center origin Bone marrow biopsy 02/21/2017: No evidence of lymphoma PET/CT scan 25/27/1292: Hypermetabolic right paratracheal nodal mass  R CHOP x6 cycles completed 07/16/2017 ---------------------------------------------------------------------- PET/CT scan has been scheduled for December 2018. Atrial flutter: Cardioversion by Dr. Acie Fredrickson 07/18/2017  Follow-up after the PET/CT scan

## 2017-07-29 NOTE — Progress Notes (Signed)
Patient Care Team: [name], MD as PCP - General (Endocrinology)  DIAGNOSIS:  Encounter Diagnoses  Name Primary?  . 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 Interstitial lung disease ([place]) Yes    SUMMARY OF ONCOLOGIC HISTORY:   Non-Hodgkin lymphoma, unspecified, intrathoracic lymph nodes ([place])   02/03/2017 Imaging    Large superior mediastinal mass contiguous with right hilar region encasing the trachea and right mainstem bronchus measuring 5.4 x 7.5 x 6 cm causing SVC obstruction, additional mediastinal lymph nodes subcarinal 1.6 cm and left hilar 1 cm; multiple small pulmonary nodules 4-6 mm       02/11/2017 Initial Diagnosis    Flow cytometry of mediastinal mass revealed monoclonal population of B cells with expression of CD10 consistent with non-Hodgkin B-cell lymphoma germinal center operation; bone marrow biopsy negative for lymphoma      04/02/2017 - 07/16/2017 Chemotherapy    R-CHOP X 6 cycles        CHIEF COMPLIANT: Severe orthostatic hypotension  INTERVAL HISTORY: Kayla Harrison is a 71 year old with above-mentioned history of diffuse large B-cell lymphoma who completed 6 cycles of R CHOP chemotherapy and is here for a follow-up.  She has been feeling tired and weak as well as dizzy when she stands up.  She has shortness of breath to minimal exertion as well as a cough.  REVIEW OF SYSTEMS:   Constitutional: Denies fevers, chills or abnormal weight loss Eyes: Denies blurriness of vision Ears, nose, mouth, throat, and face: Denies mucositis or sore throat Respiratory: Denies cough, dyspnea or wheezes Cardiovascular: Denies palpitation, chest discomfort Gastrointestinal:  Denies nausea, heartburn or change in bowel habits Skin: Denies abnormal skin rashes Lymphatics: Denies new lymphadenopathy or easy bruising Neurological:Denies numbness, tingling or new weaknesses Behavioral/Psych: Mood is stable, no new changes    Extremities: No lower extremity edema All other systems were reviewed with the patient and are negative.  I have reviewed the past medical history, past surgical history, social history and family history with the patient and they are unchanged from previous note.  ALLERGIES:  is allergic to tikosyn [dofetilide]; xarelto [rivaroxaban]; epinephrine; ketamine; and adhesive [tape].  MEDICATIONS:  Current Outpatient Medications  Medication Sig Dispense Refill  . acetaminophen (TYLENOL 8 HOUR) 650 MG CR tablet Take 1 tablet (650 mg total) by mouth every 8 (eight) hours as needed for pain. 30 tablet 0  . allopurinol (ZYLOPRIM) 300 MG tablet Take 1 tablet (300 mg total) by mouth daily. 30 tablet 3  . apixaban (ELIQUIS) 5 MG TABS tablet Take 1 tablet (5 mg total) by mouth 2 (two) times daily. 180 tablet 1  . cycloSPORINE (RESTASIS) 0.05 % ophthalmic emulsion Place 1 drop into both eyes 2 (two) times daily.    Marland Kitchen dicyclomine (BENTYL) 20 MG tablet Take 1 tablet (20 mg total) by mouth every 6 (six) hours. As needed 60 tablet 1  . diphenhydramine-acetaminophen (TYLENOL PM) 25-500 MG TABS tablet Take 1 tablet by mouth at bedtime as needed.    . diphenoxylate-atropine (LOMOTIL) 2.5-0.025 MG tablet TAKE 2 TABLETS BY MOUTH TWICE DAILY AS NEEDED FOR DIARRHEA OR LOOSE STOOL 30 tablet 0  . furosemide (LASIX) 40 MG tablet Take 40 mg by mouth daily as needed for fluid.     Marland Kitchen HYDROcodone-homatropine (HYCODAN) 5-1.5 MG/5ML syrup Take 5 mLs by mouth every 6 (six) hours as needed for cough. 120 mL 0  . levofloxacin (LEVAQUIN) 750 MG tablet Take 1 tablet (750 mg total) by  mouth daily. 7 tablet 0  . levothyroxine (SYNTHROID, LEVOTHROID) 175 MCG tablet Take 175 mcg by mouth daily before breakfast.    . lidocaine-prilocaine (EMLA) cream Apply to affected area once 30 g 3  . Lifitegrast (XIIDRA) 5 % SOLN Apply 1 drop to eye 2 (two) times daily.    Marland Kitchen LORazepam (ATIVAN) 0.5 MG tablet Take 1 tablet (0.5 mg total) by mouth at  bedtime. 30 tablet 0  . Multiple Vitamin (MULTIVITAMIN) tablet Take 1 tablet by mouth daily.     . ondansetron (ZOFRAN) 8 MG tablet Take 1 tablet (8 mg total) by mouth 2 (two) times daily as needed for refractory nausea / vomiting. Start on day 3 after cyclophosphamide chemotherapy. 30 tablet 1  . Oxycodone HCl 10 MG TABS Take 1 tablet (10 mg total) by mouth every 4 (four) hours as needed. 20 tablet 0  . predniSONE (DELTASONE) 10 MG tablet 30 mg QD x 4 days, 20 mg QD x 4 days, 10 mg x 4 days, 5 mg QD x 5 days, then stop 26 tablet 0  . predniSONE (DELTASONE) 20 MG tablet Take 3 tablets (60 mg total) by mouth daily. Take on days 1-5 of chemotherapy. 30 tablet 3  . sucralfate (CARAFATE) 1 g tablet Take 1 tablet (1 g total) by mouth 4 (four) times daily. 120 tablet 2   No current facility-administered medications for this visit.     PHYSICAL EXAMINATION: ECOG PERFORMANCE STATUS: 1 - Symptomatic but completely ambulatory  Vitals:   07/29/17 1511  BP: 132/64  Pulse: 97  Resp: 18  Temp: 98.5 F (36.9 C)  SpO2: 94%   Filed Weights   07/29/17 1511  Weight: 213 lb 8 oz (96.8 kg)    GENERAL:alert, no distress and comfortable SKIN: skin color, texture, turgor are normal, no rashes or significant lesions EYES: normal, Conjunctiva are pink and non-injected, sclera clear OROPHARYNX:no exudate, no erythema and lips, buccal mucosa, and tongue normal  NECK: supple, thyroid normal size, non-tender, without nodularity LYMPH:  no palpable lymphadenopathy in the cervical, axillary or inguinal LUNGS: clear to auscultation and percussion with normal breathing effort HEART: regular rate & rhythm and no murmurs and no lower extremity edema ABDOMEN:abdomen soft, non-tender and normal bowel sounds MUSCULOSKELETAL:no cyanosis of digits and no clubbing  NEURO: alert & oriented x 3 with fluent speech, no focal motor/sensory deficits EXTREMITIES: No lower extremity edema  LABORATORY DATA:  I have reviewed  the data as listed   Chemistry      Component Value Date/Time   NA 138 07/29/2017 1414   K 4.4 07/29/2017 1414   CL 106 02/08/2017 0233   CO2 26 07/29/2017 1414   BUN 16.9 07/29/2017 1414   CREATININE 0.9 07/29/2017 1414      Component Value Date/Time   CALCIUM 8.8 07/29/2017 1414   ALKPHOS 81 07/29/2017 1414   AST 13 07/29/2017 1414   ALT 10 07/29/2017 1414   BILITOT 0.43 07/29/2017 1414       Lab Results  Component Value Date   WBC 7.1 07/29/2017   HGB 8.4 (L) 07/29/2017   HCT 27.4 (L) 07/29/2017   MCV 101.1 (H) 07/29/2017   PLT 165 07/29/2017   NEUTROABS 6.1 07/29/2017    ASSESSMENT & PLAN:  Non-Hodgkin lymphoma, unspecified, intrathoracic lymph nodes ([facility]) 02/03/2017: Large superior mediastinal mass contiguous with right hilar region encasing the trachea and right mainstem bronchus measuring 5.4 x 7.5 x 6 cm causing SVC obstruction, additional mediastinal lymph nodes subcarinal  1.6 cm and left hilar 1 cm; multiple small pulmonary nodules 4-6 mm   Bronchoscopy and biopsy 02/09/2017: Monoclonal B-cell population expressing CD10 consistent with non-Hodgkin's B-cell lymphoma germinal center origin Bone marrow biopsy 02/21/2017: No evidence of lymphoma PET/CT scan 13/04/6577: Hypermetabolic right paratracheal nodal mass  R CHOP x6 cycles completed 07/16/2017 ---------------------------------------------------------------------- PET/CT scan has been scheduled for December 2018. Atrial flutter: Cardioversion by Dr. Acie Fredrickson 07/18/2017 Shortness of breath and orthostatic hypotension: I recommended that she get 2 units of packed red cells with Lasix in between them. Follow-up after the PET/CT scan Pulmonary consultation will be placed for the groundglass opacities and interstitial lung disease.  I spent 40 minutes talking to the patient of which more than half was spent in counseling and coordination of care.  Orders Placed This Encounter  Procedures  . Ambulatory  referral to Pulmonology    Referral Priority:   Routine    Referral Type:   Consultation    Referral Reason:   Specialty Services Required    Requested Specialty:   Pulmonary Disease    Number of Visits Requested:   1   The patient has a good understanding of the overall plan. she agrees with it. she will call with any problems that may develop before the next visit here.   Rulon Eisenmenger, MD 07/29/17

## 2017-07-30 DIAGNOSIS — G629 Polyneuropathy, unspecified: Secondary | ICD-10-CM | POA: Diagnosis not present

## 2017-07-30 DIAGNOSIS — C8522 Mediastinal (thymic) large B-cell lymphoma, intrathoracic lymph nodes: Secondary | ICD-10-CM | POA: Diagnosis not present

## 2017-07-30 DIAGNOSIS — R0602 Shortness of breath: Secondary | ICD-10-CM | POA: Diagnosis not present

## 2017-07-30 DIAGNOSIS — C383 Malignant neoplasm of mediastinum, part unspecified: Secondary | ICD-10-CM | POA: Diagnosis not present

## 2017-07-30 DIAGNOSIS — E860 Dehydration: Secondary | ICD-10-CM | POA: Diagnosis not present

## 2017-07-31 ENCOUNTER — Ambulatory Visit (HOSPITAL_COMMUNITY)
Admission: RE | Admit: 2017-07-31 | Discharge: 2017-07-31 | Disposition: A | Discharge status: Home / Self Care | Payer: Medicare Other | Source: Ambulatory Visit | Attending: Hematology and Oncology | Admitting: Hematology and Oncology

## 2017-07-31 DIAGNOSIS — Z85850 Personal history of malignant neoplasm of thyroid: Secondary | ICD-10-CM

## 2017-07-31 DIAGNOSIS — D649 Anemia, unspecified: Secondary | ICD-10-CM | POA: Diagnosis not present

## 2017-07-31 MED ORDER — ACETAMINOPHEN 325 MG PO TABS
650.0000 mg | ORAL_TABLET | Freq: Once | ORAL | Status: AC
  Administered 2017-07-31: 650 mg via ORAL
  Filled 2017-07-31: qty 2

## 2017-07-31 MED ORDER — SODIUM CHLORIDE 0.9% FLUSH: 10.0000 mL | INTRAVENOUS | Status: DC | PRN

## 2017-07-31 MED ORDER — DIPHENHYDRAMINE HCL 25 MG PO CAPS
25.0000 mg | ORAL_CAPSULE | Freq: Once | ORAL | Status: AC
  Administered 2017-07-31: 25 mg via ORAL
  Filled 2017-07-31: qty 1

## 2017-07-31 MED ORDER — SODIUM CHLORIDE 0.9 % IV SOLN
250.0000 mL | Freq: Once | INTRAVENOUS | Status: AC
  Administered 2017-07-31: 250 mL via INTRAVENOUS

## 2017-07-31 MED ORDER — SODIUM CHLORIDE 0.9% FLUSH: 3.0000 mL | INTRAVENOUS | Status: DC | PRN

## 2017-07-31 MED ORDER — HEPARIN SOD (PORK) LOCK FLUSH 100 UNIT/ML IV SOLN
500.0000 [IU] | Freq: Every day | INTRAVENOUS | Status: DC | PRN
  Filled 2017-07-31: qty 5

## 2017-07-31 MED ORDER — FUROSEMIDE 10 MG/ML IJ SOLN
20.0000 mg | Freq: Once | INTRAMUSCULAR | Status: AC
  Administered 2017-07-31: 20 mg via INTRAVENOUS
  Filled 2017-07-31: qty 2

## 2017-07-31 MED ORDER — HEPARIN SOD (PORK) LOCK FLUSH 100 UNIT/ML IV SOLN: 250.0000 [IU] | INTRAVENOUS | Status: DC | PRN

## 2017-07-31 NOTE — Discharge Instructions (Signed)

## 2017-07-31 NOTE — Progress Notes (Signed)
Pt received to [facility] for transfusion of 2 units PRBC. Transfusion received via implanted port without complications. Pt discharged to home in stable ambulatory condition accompanied by friend. Coolidge Breeze, RN 07/31/2017

## 2017-08-01 DIAGNOSIS — R0602 Shortness of breath: Secondary | ICD-10-CM | POA: Diagnosis not present

## 2017-08-01 DIAGNOSIS — G629 Polyneuropathy, unspecified: Secondary | ICD-10-CM | POA: Diagnosis not present

## 2017-08-01 DIAGNOSIS — C8522 Mediastinal (thymic) large B-cell lymphoma, intrathoracic lymph nodes: Secondary | ICD-10-CM | POA: Diagnosis not present

## 2017-08-01 DIAGNOSIS — C383 Malignant neoplasm of mediastinum, part unspecified: Secondary | ICD-10-CM | POA: Diagnosis not present

## 2017-08-01 DIAGNOSIS — E860 Dehydration: Secondary | ICD-10-CM | POA: Diagnosis not present

## 2017-08-01 LAB — TYPE AND SCREEN
ABO/RH(D): O POS
Antibody Screen: NEGATIVE
UNIT DIVISION: 0
Unit division: 0

## 2017-08-01 LAB — BPAM RBC
BLOOD PRODUCT EXPIRATION DATE: 201812012359
Blood Product Expiration Date: 201812032359
ISSUE DATE / TIME: 201811080826
ISSUE DATE / TIME: 201811080826
UNIT TYPE AND RH: 5100
Unit Type and Rh: 5100

## 2017-08-04 DIAGNOSIS — C8522 Mediastinal (thymic) large B-cell lymphoma, intrathoracic lymph nodes: Secondary | ICD-10-CM | POA: Diagnosis not present

## 2017-08-04 DIAGNOSIS — C383 Malignant neoplasm of mediastinum, part unspecified: Secondary | ICD-10-CM | POA: Diagnosis not present

## 2017-08-04 DIAGNOSIS — G629 Polyneuropathy, unspecified: Secondary | ICD-10-CM | POA: Diagnosis not present

## 2017-08-04 DIAGNOSIS — R0602 Shortness of breath: Secondary | ICD-10-CM | POA: Diagnosis not present

## 2017-08-04 DIAGNOSIS — E860 Dehydration: Secondary | ICD-10-CM | POA: Diagnosis not present

## 2017-08-07 DIAGNOSIS — R0602 Shortness of breath: Secondary | ICD-10-CM | POA: Diagnosis not present

## 2017-08-07 DIAGNOSIS — G629 Polyneuropathy, unspecified: Secondary | ICD-10-CM | POA: Diagnosis not present

## 2017-08-07 DIAGNOSIS — C383 Malignant neoplasm of mediastinum, part unspecified: Secondary | ICD-10-CM | POA: Diagnosis not present

## 2017-08-07 DIAGNOSIS — E860 Dehydration: Secondary | ICD-10-CM | POA: Diagnosis not present

## 2017-08-07 DIAGNOSIS — C8522 Mediastinal (thymic) large B-cell lymphoma, intrathoracic lymph nodes: Secondary | ICD-10-CM | POA: Diagnosis not present

## 2017-08-11 ENCOUNTER — Telehealth: Payer: Self-pay

## 2017-08-11 NOTE — Telephone Encounter (Signed)
Informed pt per Dr. Lindi Adie it was ok for her to stop taking the allopurinol now that chemo is completed. Pt aware.  Cyndia Bent RN

## 2017-08-12 DIAGNOSIS — C8522 Mediastinal (thymic) large B-cell lymphoma, intrathoracic lymph nodes: Secondary | ICD-10-CM | POA: Diagnosis not present

## 2017-08-12 DIAGNOSIS — E860 Dehydration: Secondary | ICD-10-CM | POA: Diagnosis not present

## 2017-08-12 DIAGNOSIS — G629 Polyneuropathy, unspecified: Secondary | ICD-10-CM | POA: Diagnosis not present

## 2017-08-12 DIAGNOSIS — R0602 Shortness of breath: Secondary | ICD-10-CM | POA: Diagnosis not present

## 2017-08-12 DIAGNOSIS — C383 Malignant neoplasm of mediastinum, part unspecified: Secondary | ICD-10-CM | POA: Diagnosis not present

## 2017-08-15 DIAGNOSIS — C8522 Mediastinal (thymic) large B-cell lymphoma, intrathoracic lymph nodes: Secondary | ICD-10-CM | POA: Diagnosis not present

## 2017-08-15 DIAGNOSIS — R0602 Shortness of breath: Secondary | ICD-10-CM | POA: Diagnosis not present

## 2017-08-15 DIAGNOSIS — C383 Malignant neoplasm of mediastinum, part unspecified: Secondary | ICD-10-CM | POA: Diagnosis not present

## 2017-08-15 DIAGNOSIS — E860 Dehydration: Secondary | ICD-10-CM | POA: Diagnosis not present

## 2017-08-15 DIAGNOSIS — G629 Polyneuropathy, unspecified: Secondary | ICD-10-CM | POA: Diagnosis not present

## 2017-08-20 DIAGNOSIS — E860 Dehydration: Secondary | ICD-10-CM | POA: Diagnosis not present

## 2017-08-20 DIAGNOSIS — R0602 Shortness of breath: Secondary | ICD-10-CM | POA: Diagnosis not present

## 2017-08-20 DIAGNOSIS — C383 Malignant neoplasm of mediastinum, part unspecified: Secondary | ICD-10-CM | POA: Diagnosis not present

## 2017-08-20 DIAGNOSIS — G629 Polyneuropathy, unspecified: Secondary | ICD-10-CM | POA: Diagnosis not present

## 2017-08-20 DIAGNOSIS — C8522 Mediastinal (thymic) large B-cell lymphoma, intrathoracic lymph nodes: Secondary | ICD-10-CM | POA: Diagnosis not present

## 2017-08-21 ENCOUNTER — Telehealth: Payer: Self-pay

## 2017-08-21 NOTE — Telephone Encounter (Signed)
Pt called requesting to have labs drawn with her next appt next week. Pt will be coming in to discuss her scan results with Dr.Gudena but would like to  Recheck her labs. Unable to schedule pt for flush around her appt time. Told pt that desk nurse can access her port and draw labs then. Pt verbalized understanding and confirmed appt for next week .

## 2017-08-26 ENCOUNTER — Other Ambulatory Visit: Payer: Self-pay | Admitting: Internal Medicine

## 2017-08-26 DIAGNOSIS — R0602 Shortness of breath: Secondary | ICD-10-CM | POA: Diagnosis not present

## 2017-08-26 DIAGNOSIS — G629 Polyneuropathy, unspecified: Secondary | ICD-10-CM | POA: Diagnosis not present

## 2017-08-26 DIAGNOSIS — C383 Malignant neoplasm of mediastinum, part unspecified: Secondary | ICD-10-CM | POA: Diagnosis not present

## 2017-08-26 DIAGNOSIS — C8522 Mediastinal (thymic) large B-cell lymphoma, intrathoracic lymph nodes: Secondary | ICD-10-CM | POA: Diagnosis not present

## 2017-08-26 DIAGNOSIS — E860 Dehydration: Secondary | ICD-10-CM | POA: Diagnosis not present

## 2017-08-27 ENCOUNTER — Ambulatory Visit (HOSPITAL_COMMUNITY)
Admission: RE | Admit: 2017-08-27 | Discharge: 2017-08-27 | Disposition: A | Discharge status: Home / Self Care | Payer: Medicare Other | Source: Ambulatory Visit | Attending: Hematology and Oncology | Admitting: Hematology and Oncology

## 2017-08-27 DIAGNOSIS — R911 Solitary pulmonary nodule: Secondary | ICD-10-CM | POA: Diagnosis not present

## 2017-08-27 DIAGNOSIS — N132 Hydronephrosis with renal and ureteral calculous obstruction: Secondary | ICD-10-CM | POA: Insufficient documentation

## 2017-08-27 DIAGNOSIS — I700 Atherosclerosis of aorta: Secondary | ICD-10-CM | POA: Diagnosis not present

## 2017-08-27 DIAGNOSIS — I289 Disease of pulmonary vessels, unspecified: Secondary | ICD-10-CM | POA: Diagnosis not present

## 2017-08-27 DIAGNOSIS — N202 Calculus of kidney with calculus of ureter: Secondary | ICD-10-CM | POA: Diagnosis not present

## 2017-08-27 DIAGNOSIS — J90 Pleural effusion, not elsewhere classified: Secondary | ICD-10-CM | POA: Insufficient documentation

## 2017-08-27 DIAGNOSIS — N200 Calculus of kidney: Secondary | ICD-10-CM | POA: Diagnosis not present

## 2017-08-27 DIAGNOSIS — N201 Calculus of ureter: Secondary | ICD-10-CM | POA: Diagnosis not present

## 2017-08-27 DIAGNOSIS — C8522 Mediastinal (thymic) large B-cell lymphoma, intrathoracic lymph nodes: Secondary | ICD-10-CM | POA: Diagnosis not present

## 2017-08-27 DIAGNOSIS — I7789 Other specified disorders of arteries and arterioles: Secondary | ICD-10-CM | POA: Diagnosis not present

## 2017-08-27 DIAGNOSIS — I2510 Atherosclerotic heart disease of native coronary artery without angina pectoris: Secondary | ICD-10-CM | POA: Insufficient documentation

## 2017-08-27 DIAGNOSIS — C8330 Diffuse large B-cell lymphoma, unspecified site: Secondary | ICD-10-CM | POA: Diagnosis not present

## 2017-08-27 LAB — GLUCOSE, CAPILLARY: Glucose-Capillary: 72 mg/dL (ref 65–99)

## 2017-08-27 MED ORDER — FLUDEOXYGLUCOSE F - 18 (FDG) INJECTION
10.4900 | Freq: Once | INTRAVENOUS | Status: AC | PRN
  Administered 2017-08-27: 10.49 via INTRAVENOUS

## 2017-08-28 ENCOUNTER — Telehealth: Payer: Self-pay | Admitting: Hematology and Oncology

## 2017-08-28 ENCOUNTER — Other Ambulatory Visit (HOSPITAL_BASED_OUTPATIENT_CLINIC_OR_DEPARTMENT_OTHER): Payer: Medicare Other

## 2017-08-28 ENCOUNTER — Ambulatory Visit (HOSPITAL_BASED_OUTPATIENT_CLINIC_OR_DEPARTMENT_OTHER): Payer: Medicare Other | Admitting: Hematology and Oncology

## 2017-08-28 ENCOUNTER — Encounter: Payer: Self-pay | Admitting: Hematology and Oncology

## 2017-08-28 ENCOUNTER — Other Ambulatory Visit: Payer: Self-pay

## 2017-08-28 DIAGNOSIS — Z853 Personal history of malignant neoplasm of breast: Secondary | ICD-10-CM

## 2017-08-28 DIAGNOSIS — C8522 Mediastinal (thymic) large B-cell lymphoma, intrathoracic lymph nodes: Secondary | ICD-10-CM | POA: Diagnosis not present

## 2017-08-28 LAB — COMPREHENSIVE METABOLIC PANEL
ALT: 18 U/L (ref 0–55)
AST: 23 U/L (ref 5–34)
Albumin: 3.3 g/dL — ABNORMAL LOW (ref 3.5–5.0)
Alkaline Phosphatase: 71 U/L (ref 40–150)
Anion Gap: 10 mEq/L (ref 3–11)
BILIRUBIN TOTAL: 0.89 mg/dL (ref 0.20–1.20)
BUN: 12.7 mg/dL (ref 7.0–26.0)
CALCIUM: 9.2 mg/dL (ref 8.4–10.4)
CO2: 22 mEq/L (ref 22–29)
Chloride: 109 mEq/L (ref 98–109)
Creatinine: 1 mg/dL (ref 0.6–1.1)
EGFR: 59 mL/min/{1.73_m2} — AB (ref >60)
GLUCOSE: 102 mg/dL (ref 70–140)
POTASSIUM: 4.2 meq/L (ref 3.5–5.1)
SODIUM: 140 meq/L (ref 136–145)
TOTAL PROTEIN: 6.1 g/dL — AB (ref 6.4–8.3)

## 2017-08-28 LAB — CBC WITH DIFFERENTIAL/PLATELET
BASO%: 1.2 % (ref 0.0–2.0)
BASOS ABS: 0.1 10*3/uL (ref 0.0–0.1)
EOS ABS: 0.3 10*3/uL (ref 0.0–0.5)
EOS%: 4.3 % (ref 0.0–7.0)
HEMATOCRIT: 34.5 % — AB (ref 34.8–46.6)
HEMOGLOBIN: 10.8 g/dL — AB (ref 11.6–15.9)
LYMPH#: 0.5 10*3/uL — AB (ref 0.9–3.3)
LYMPH%: 8.2 % — ABNORMAL LOW (ref 14.0–49.7)
MCH: 31.4 pg (ref 25.1–34.0)
MCHC: 31.3 g/dL — ABNORMAL LOW (ref 31.5–36.0)
MCV: 100.3 fL (ref 79.5–101.0)
MONO#: 0.9 10*3/uL (ref 0.1–0.9)
MONO%: 15.3 % — ABNORMAL HIGH (ref 0.0–14.0)
NEUT%: 71 % (ref 38.4–76.8)
NEUTROS ABS: 4.1 10*3/uL (ref 1.5–6.5)
PLATELETS: 175 10*3/uL (ref 145–400)
RBC: 3.44 10*6/uL — ABNORMAL LOW (ref 3.70–5.45)
RDW: 16.8 % — AB (ref 11.2–14.5)
WBC: 5.8 10*3/uL (ref 3.9–10.3)

## 2017-08-28 NOTE — Assessment & Plan Note (Signed)
02/03/2017: Large superior mediastinal mass contiguous with right hilar region encasing the trachea and right mainstem bronchus measuring 5.4 x 7.5 x 6 cm causing SVC obstruction, additional mediastinal lymph nodes subcarinal 1.6 cm and left hilar 1 cm; multiple small pulmonary nodules 4-6 mm   Bronchoscopy and biopsy 02/09/2017: Monoclonal B-cell population expressing CD10 consistent with non-Hodgkin's B-cell lymphoma germinal center origin Bone marrow biopsy 02/21/2017: No evidence of lymphoma PET/CT scan 84/78/4128: Hypermetabolic right paratracheal nodal mass  R CHOP x6 cycles completed 07/16/2017 ---------------------------------------------------------------------- PET/CT scan has been scheduled for December 2018. Atrial flutter: Cardioversion by Dr. Acie Fredrickson 07/18/2017  PET/CT scan 08/27/2017:

## 2017-08-28 NOTE — Telephone Encounter (Signed)
Scheduled appt per 12/6 los- gave patient avs.

## 2017-08-28 NOTE — Patient Instructions (Signed)

## 2017-08-28 NOTE — Progress Notes (Signed)
Labs drawn from port. Flush well. Blood return noted. Patient is interested in port removal. Will follow up.  Cyndia Bent RN

## 2017-08-29 ENCOUNTER — Other Ambulatory Visit (HOSPITAL_COMMUNITY): Payer: Self-pay | Admitting: Urology

## 2017-08-29 ENCOUNTER — Other Ambulatory Visit (HOSPITAL_COMMUNITY): Payer: Self-pay | Admitting: *Deleted

## 2017-08-29 ENCOUNTER — Telehealth: Payer: Self-pay | Admitting: Internal Medicine

## 2017-08-29 DIAGNOSIS — N201 Calculus of ureter: Secondary | ICD-10-CM | POA: Diagnosis not present

## 2017-08-29 DIAGNOSIS — N202 Calculus of kidney with calculus of ureter: Secondary | ICD-10-CM | POA: Diagnosis not present

## 2017-08-29 NOTE — Telephone Encounter (Signed)
° °  [facility] Pre-operative Risk Assessment    Request for surgical clearance:  1. What type of surgery is being performed? Placement of Nephrostomy Tube  2. When is this surgery scheduled? Pending   3. Are there any medications that need to be held prior to surgery and how long?Can pt hold Eliquis? If so,how long and General Cardiac Clearance   4. Practice name and name of physician performing surgery? Radiologist at [facility]   5. What is your office phone and fax number? [phone] and fax is [phone]   6. Anesthesia type (None, local, MAC, general) ? Not sure   Kayla Harrison 08/29/2017, 9:27 AM  _________________________________________________________________   (provider comments below)

## 2017-09-01 NOTE — Telephone Encounter (Addendum)
   Chart reviewed as part of pre-operative protocol coverage. Patient was contacted 09/01/2017 in reference to pre-operative risk assessment for pending surgery as outlined below.  Auda Finfrock Mcclory was last seen on 07/21/17 by [name] at which time notes indicate significant dyspnea previously requiring cessation of amiodarone. I was able to speak with Ms. Larkin Ina and she reports significant weakness since having undergone chemotherapy. She's been working with PT but still remains weak and unable to do much activity. She reports at most she believes she could walk 1 block if she absolutely needed to, but that brings her to a total max METS of 3.97. She also continues to have some dyspnea on exertion in setting of deconditioining. No chest pain. Her revised cardiac risk index was calculated at 0.9% indicating low risk for cardiac events. However, given poor functional capacity, will need input from Dr. Caryl Comes prior to clearing. Note cath in 2014 without significant CAD, normal LVEF.  Dr. Caryl Comes, what are your thoughts on clearance for her nephrostomy tube? This is scheduled on 09/09/17. Please route your reply to P CV DIV PREOP. You are scheduled to see her 09/19/17.  Will also forward to pharmacy in interim for input on anticoagulation.  Charlie Pitter, PA-C 09/01/2017, 9:38 AM

## 2017-09-01 NOTE — Telephone Encounter (Signed)
Patient with diagnosis of Atrial fibrillation on E;iquis for anticoagulation.    Procedure: Nephrostomy Tube placement Date of procedure: TBD  CHADS2-VASc score of  5 (CHF, AGE,stroke/tia x 2,  Female) *Noted CVA documented > 8 years ago*  CrCl  = 58 ml/min (using adjusted BW on patient with BMI > 40)  Platelet count = 175 (08-28-2017)  Per office protocol, patient can hold Eliquis for 2 days prior to procedure.  Patient should restart Eliquis on the evening of procedure or day after, at discretion of procedure MD due to history of previous CVA.  Andreya Lacks Rodriguez-Guzman PharmD, BCPS, [facility] [address] [place] [address] 09/01/2017 1:01 PM

## 2017-09-03 DIAGNOSIS — C383 Malignant neoplasm of mediastinum, part unspecified: Secondary | ICD-10-CM | POA: Diagnosis not present

## 2017-09-03 DIAGNOSIS — E860 Dehydration: Secondary | ICD-10-CM | POA: Diagnosis not present

## 2017-09-03 DIAGNOSIS — G629 Polyneuropathy, unspecified: Secondary | ICD-10-CM | POA: Diagnosis not present

## 2017-09-03 DIAGNOSIS — R0602 Shortness of breath: Secondary | ICD-10-CM | POA: Diagnosis not present

## 2017-09-03 DIAGNOSIS — C8522 Mediastinal (thymic) large B-cell lymphoma, intrathoracic lymph nodes: Secondary | ICD-10-CM | POA: Diagnosis not present

## 2017-09-03 NOTE — Telephone Encounter (Signed)
   Primary Cardiologist: Dr. Caryl Comes  Chart reviewed as part of pre-operative protocol coverage. Kayla Harrison has been cleared for the planned procedure without further cardiovascular testing.   Can hold Eliquis two days prior to the procedure and restart the evening of the procedure or the day after, at the discretion of the procedure MD. Called the patient and informed her of these recommendations.   I will route this recommendation to the requesting party via Epic fax function and remove from pre-op pool.  Please call with questions.  Erma Heritage, PA-C 09/03/2017, 3:06 PM

## 2017-09-03 NOTE — Telephone Encounter (Signed)
Agree 

## 2017-09-05 ENCOUNTER — Telehealth: Payer: Self-pay | Admitting: Internal Medicine

## 2017-09-05 ENCOUNTER — Other Ambulatory Visit: Payer: Self-pay | Admitting: Urology

## 2017-09-05 NOTE — Telephone Encounter (Signed)
° °  F/U Call:  Kayla Harrison calling from [facility] asks that you fax clearance to [phone]. Kayla Harrison states that she cannot pull clearance from [facility].     1. What type of surgery is being performed? Placement of Nephrostomy Tube  2. When is this surgery scheduled? Pending   3. Are there any medications that need to be held prior to surgery and how long?Can pt hold Eliquis? If so,how long and General Cardiac Clearance   4. Practice name and name of physician performing surgery? Radiologist at [facility]   5. What is your office phone and fax number? [phone] and fax is [phone]   6. Anesthesia type (None, local, MAC, general) ? Not sure

## 2017-09-05 NOTE — Telephone Encounter (Signed)
Called pt to let her know that she would need to see Dr. Caryl Comes before she could be cleared for her upcoming procedure. Pt is already scheduled 09/19/17 and we will leave it as is, per pt. Pt thanked me for the call.

## 2017-09-05 NOTE — Telephone Encounter (Signed)
   Primary Cardiologist:Dr. Caryl Comes  Chart reviewed as part of pre-operative protocol coverage. Because of Kayla Harrison's past medical history and time since last visit, he/she will require a follow-up visit in order to better assess preoperative cardiovascular risk. Pt having episode of tachycardia Dr. Caryl Comes or [name] should see. Thank you.  Pre-op covering staff: - Please schedule appointment and call patient to inform them. - Please contact requesting surgeon's office via preferred method (i.e, phone, fax) to inform them of need for appointment prior to surgery.  Cecilie Kicks, NP  09/05/2017, 1:46 PM

## 2017-09-08 ENCOUNTER — Encounter: Payer: Self-pay | Admitting: Hematology and Oncology

## 2017-09-09 ENCOUNTER — Encounter: Payer: Self-pay | Admitting: Hematology and Oncology

## 2017-09-09 ENCOUNTER — Other Ambulatory Visit (HOSPITAL_COMMUNITY): Payer: Medicare Other

## 2017-09-09 ENCOUNTER — Encounter (HOSPITAL_COMMUNITY): Payer: Self-pay

## 2017-09-09 ENCOUNTER — Ambulatory Visit (HOSPITAL_COMMUNITY): Payer: Medicare Other

## 2017-09-10 DIAGNOSIS — G629 Polyneuropathy, unspecified: Secondary | ICD-10-CM | POA: Diagnosis not present

## 2017-09-10 DIAGNOSIS — R0602 Shortness of breath: Secondary | ICD-10-CM | POA: Diagnosis not present

## 2017-09-10 DIAGNOSIS — E860 Dehydration: Secondary | ICD-10-CM | POA: Diagnosis not present

## 2017-09-10 DIAGNOSIS — C383 Malignant neoplasm of mediastinum, part unspecified: Secondary | ICD-10-CM | POA: Diagnosis not present

## 2017-09-10 DIAGNOSIS — C8522 Mediastinal (thymic) large B-cell lymphoma, intrathoracic lymph nodes: Secondary | ICD-10-CM | POA: Diagnosis not present

## 2017-09-13 DIAGNOSIS — C383 Malignant neoplasm of mediastinum, part unspecified: Secondary | ICD-10-CM | POA: Diagnosis not present

## 2017-09-13 DIAGNOSIS — R0602 Shortness of breath: Secondary | ICD-10-CM | POA: Diagnosis not present

## 2017-09-13 DIAGNOSIS — E860 Dehydration: Secondary | ICD-10-CM | POA: Diagnosis not present

## 2017-09-13 DIAGNOSIS — C8522 Mediastinal (thymic) large B-cell lymphoma, intrathoracic lymph nodes: Secondary | ICD-10-CM | POA: Diagnosis not present

## 2017-09-13 DIAGNOSIS — G629 Polyneuropathy, unspecified: Secondary | ICD-10-CM | POA: Diagnosis not present

## 2017-09-15 ENCOUNTER — Emergency Department (HOSPITAL_COMMUNITY): Payer: Medicare Other

## 2017-09-15 ENCOUNTER — Telehealth: Payer: Self-pay | Admitting: Cardiology

## 2017-09-15 ENCOUNTER — Inpatient Hospital Stay (HOSPITAL_COMMUNITY)
Admission: EM | Admit: 2017-09-15 | Discharge: 2017-09-17 | DRG: 292 | Disposition: A | Discharge status: Home / Self Care | Payer: Medicare Other | Attending: Internal Medicine | Admitting: Internal Medicine

## 2017-09-15 ENCOUNTER — Telehealth: Payer: Self-pay | Admitting: *Deleted

## 2017-09-15 ENCOUNTER — Other Ambulatory Visit: Payer: Self-pay

## 2017-09-15 ENCOUNTER — Encounter: Payer: Self-pay | Admitting: Internal Medicine

## 2017-09-15 ENCOUNTER — Encounter (HOSPITAL_COMMUNITY): Payer: Self-pay | Admitting: *Deleted

## 2017-09-15 DIAGNOSIS — N1330 Unspecified hydronephrosis: Secondary | ICD-10-CM | POA: Diagnosis not present

## 2017-09-15 DIAGNOSIS — Z79899 Other long term (current) drug therapy: Secondary | ICD-10-CM

## 2017-09-15 DIAGNOSIS — N390 Urinary tract infection, site not specified: Secondary | ICD-10-CM | POA: Diagnosis not present

## 2017-09-15 DIAGNOSIS — R0602 Shortness of breath: Secondary | ICD-10-CM | POA: Diagnosis not present

## 2017-09-15 DIAGNOSIS — I4891 Unspecified atrial fibrillation: Secondary | ICD-10-CM | POA: Diagnosis not present

## 2017-09-15 DIAGNOSIS — Z860100 Personal history of colon polyps, unspecified: Secondary | ICD-10-CM

## 2017-09-15 DIAGNOSIS — E785 Hyperlipidemia, unspecified: Secondary | ICD-10-CM | POA: Diagnosis not present

## 2017-09-15 DIAGNOSIS — Z96651 Presence of right artificial knee joint: Secondary | ICD-10-CM | POA: Diagnosis present

## 2017-09-15 DIAGNOSIS — E6609 Other obesity due to excess calories: Secondary | ICD-10-CM | POA: Diagnosis present

## 2017-09-15 DIAGNOSIS — I4892 Unspecified atrial flutter: Secondary | ICD-10-CM | POA: Diagnosis present

## 2017-09-15 DIAGNOSIS — G4733 Obstructive sleep apnea (adult) (pediatric): Secondary | ICD-10-CM | POA: Diagnosis present

## 2017-09-15 DIAGNOSIS — C8510 Unspecified B-cell lymphoma, unspecified site: Secondary | ICD-10-CM | POA: Diagnosis present

## 2017-09-15 DIAGNOSIS — D638 Anemia in other chronic diseases classified elsewhere: Secondary | ICD-10-CM | POA: Diagnosis not present

## 2017-09-15 DIAGNOSIS — Z95828 Presence of other vascular implants and grafts: Secondary | ICD-10-CM

## 2017-09-15 DIAGNOSIS — J90 Pleural effusion, not elsewhere classified: Secondary | ICD-10-CM | POA: Diagnosis not present

## 2017-09-15 DIAGNOSIS — I110 Hypertensive heart disease with heart failure: Principal | ICD-10-CM | POA: Diagnosis present

## 2017-09-15 DIAGNOSIS — C8592 Non-Hodgkin lymphoma, unspecified, intrathoracic lymph nodes: Secondary | ICD-10-CM | POA: Diagnosis present

## 2017-09-15 DIAGNOSIS — Z6833 Body mass index (BMI) 33.0-33.9, adult: Secondary | ICD-10-CM | POA: Diagnosis present

## 2017-09-15 DIAGNOSIS — I509 Heart failure, unspecified: Secondary | ICD-10-CM | POA: Diagnosis not present

## 2017-09-15 DIAGNOSIS — Z7901 Long term (current) use of anticoagulants: Secondary | ICD-10-CM

## 2017-09-15 DIAGNOSIS — Z79890 Hormone replacement therapy (postmenopausal): Secondary | ICD-10-CM

## 2017-09-15 DIAGNOSIS — Z9221 Personal history of antineoplastic chemotherapy: Secondary | ICD-10-CM

## 2017-09-15 DIAGNOSIS — Z85850 Personal history of malignant neoplasm of thyroid: Secondary | ICD-10-CM

## 2017-09-15 DIAGNOSIS — E782 Mixed hyperlipidemia: Secondary | ICD-10-CM | POA: Diagnosis present

## 2017-09-15 DIAGNOSIS — Z8249 Family history of ischemic heart disease and other diseases of the circulatory system: Secondary | ICD-10-CM

## 2017-09-15 DIAGNOSIS — I5032 Chronic diastolic (congestive) heart failure: Secondary | ICD-10-CM | POA: Diagnosis not present

## 2017-09-15 DIAGNOSIS — Z950 Presence of cardiac pacemaker: Secondary | ICD-10-CM

## 2017-09-15 DIAGNOSIS — I481 Persistent atrial fibrillation: Secondary | ICD-10-CM | POA: Diagnosis present

## 2017-09-15 DIAGNOSIS — Z85118 Personal history of other malignant neoplasm of bronchus and lung: Secondary | ICD-10-CM

## 2017-09-15 DIAGNOSIS — Z8673 Personal history of transient ischemic attack (TIA), and cerebral infarction without residual deficits: Secondary | ICD-10-CM

## 2017-09-15 DIAGNOSIS — Z91048 Other nonmedicinal substance allergy status: Secondary | ICD-10-CM

## 2017-09-15 DIAGNOSIS — Z923 Personal history of irradiation: Secondary | ICD-10-CM

## 2017-09-15 DIAGNOSIS — E039 Hypothyroidism, unspecified: Secondary | ICD-10-CM | POA: Diagnosis present

## 2017-09-15 DIAGNOSIS — Z87442 Personal history of urinary calculi: Secondary | ICD-10-CM

## 2017-09-15 DIAGNOSIS — I493 Ventricular premature depolarization: Secondary | ICD-10-CM | POA: Diagnosis present

## 2017-09-15 DIAGNOSIS — R0902 Hypoxemia: Secondary | ICD-10-CM | POA: Diagnosis not present

## 2017-09-15 DIAGNOSIS — Z853 Personal history of malignant neoplasm of breast: Secondary | ICD-10-CM

## 2017-09-15 DIAGNOSIS — Z888 Allergy status to other drugs, medicaments and biological substances status: Secondary | ICD-10-CM

## 2017-09-15 DIAGNOSIS — Z6834 Body mass index (BMI) 34.0-34.9, adult: Secondary | ICD-10-CM

## 2017-09-15 DIAGNOSIS — C8590 Non-Hodgkin lymphoma, unspecified, unspecified site: Secondary | ICD-10-CM | POA: Diagnosis present

## 2017-09-15 HISTORY — DX: Encounter for other specified aftercare: Z51.89

## 2017-09-15 HISTORY — DX: Anemia, unspecified: D64.9

## 2017-09-15 HISTORY — DX: Other persistent atrial fibrillation: I48.19

## 2017-09-15 LAB — TROPONIN I
TROPONIN I: 0.05 ng/mL — AB (ref <0.03)
Troponin I: 0.05 ng/mL (ref <0.03)
Troponin I: 0.06 ng/mL (ref <0.03)

## 2017-09-15 LAB — URINALYSIS, ROUTINE W REFLEX MICROSCOPIC
Bilirubin Urine: NEGATIVE
Glucose, UA: NEGATIVE mg/dL
KETONES UR: NEGATIVE mg/dL
Nitrite: NEGATIVE
PH: 6 (ref 5.0–8.0)
Protein, ur: 100 mg/dL — AB
Specific Gravity, Urine: 1.015 (ref 1.005–1.030)

## 2017-09-15 LAB — COMPREHENSIVE METABOLIC PANEL
ALK PHOS: 62 U/L (ref 38–126)
ALT: 15 U/L (ref 14–54)
AST: 16 U/L (ref 15–41)
Albumin: 3 g/dL — ABNORMAL LOW (ref 3.5–5.0)
Anion gap: 7 (ref 5–15)
BUN: 11 mg/dL (ref 6–20)
CALCIUM: 8.8 mg/dL — AB (ref 8.9–10.3)
CO2: 24 mmol/L (ref 22–32)
CREATININE: 0.93 mg/dL (ref 0.44–1.00)
Chloride: 109 mmol/L (ref 101–111)
GFR, EST NON AFRICAN AMERICAN: 60 mL/min — AB (ref >60)
Glucose, Bld: 102 mg/dL — ABNORMAL HIGH (ref 65–99)
Potassium: 4.1 mmol/L (ref 3.5–5.1)
Sodium: 140 mmol/L (ref 135–145)
Total Bilirubin: 1.2 mg/dL (ref 0.3–1.2)
Total Protein: 5.5 g/dL — ABNORMAL LOW (ref 6.5–8.1)

## 2017-09-15 LAB — CBC WITH DIFFERENTIAL/PLATELET
Basophils Absolute: 0 10*3/uL (ref 0.0–0.1)
Basophils Relative: 1 %
Eosinophils Absolute: 0.1 10*3/uL (ref 0.0–0.7)
Eosinophils Relative: 3 %
HCT: 32.6 % — ABNORMAL LOW (ref 36.0–46.0)
HEMOGLOBIN: 10.2 g/dL — AB (ref 12.0–15.0)
LYMPHS ABS: 0.7 10*3/uL (ref 0.7–4.0)
LYMPHS PCT: 16 %
MCH: 31.2 pg (ref 26.0–34.0)
MCHC: 31.3 g/dL (ref 30.0–36.0)
MCV: 99.7 fL (ref 78.0–100.0)
Monocytes Absolute: 0.4 10*3/uL (ref 0.1–1.0)
Monocytes Relative: 8 %
NEUTROS ABS: 3 10*3/uL (ref 1.7–7.7)
NEUTROS PCT: 72 %
Platelets: 156 10*3/uL (ref 150–400)
RBC: 3.27 MIL/uL — AB (ref 3.87–5.11)
RDW: 15.9 % — ABNORMAL HIGH (ref 11.5–15.5)
WBC: 4.2 10*3/uL (ref 4.0–10.5)

## 2017-09-15 LAB — MAGNESIUM: MAGNESIUM: 1.9 mg/dL (ref 1.7–2.4)

## 2017-09-15 LAB — BRAIN NATRIURETIC PEPTIDE: B NATRIURETIC PEPTIDE 5: 250.2 pg/mL — AB (ref 0.0–100.0)

## 2017-09-15 LAB — T4, FREE: FREE T4: 1.63 ng/dL — AB (ref 0.61–1.12)

## 2017-09-15 LAB — MRSA PCR SCREENING: MRSA BY PCR: NEGATIVE

## 2017-09-15 LAB — TSH: TSH: 0.722 u[IU]/mL (ref 0.350–4.500)

## 2017-09-15 MED ORDER — DEXTROSE 5 % IV SOLN
1.0000 g | INTRAVENOUS | Status: DC
  Administered 2017-09-16 – 2017-09-17 (×2): 1 g via INTRAVENOUS
  Filled 2017-09-15 (×2): qty 10

## 2017-09-15 MED ORDER — WHITE PETROLATUM EX OINT
TOPICAL_OINTMENT | CUTANEOUS | Status: AC
  Filled 2017-09-15: qty 28.35

## 2017-09-15 MED ORDER — ONDANSETRON HCL 4 MG/2ML IJ SOLN: 4.0000 mg | Freq: Four times a day (QID) | INTRAMUSCULAR | Status: DC | PRN

## 2017-09-15 MED ORDER — IOPAMIDOL (ISOVUE-370) INJECTION 76%
INTRAVENOUS | Status: AC
  Administered 2017-09-15: 100 mL
  Filled 2017-09-15: qty 100

## 2017-09-15 MED ORDER — ENSURE ENLIVE PO LIQD
237.0000 mL | Freq: Two times a day (BID) | ORAL | Status: DC
  Administered 2017-09-15: 237 mL via ORAL

## 2017-09-15 MED ORDER — CYCLOSPORINE 0.05 % OP EMUL
1.0000 [drp] | Freq: Two times a day (BID) | OPHTHALMIC | Status: DC
  Administered 2017-09-15 – 2017-09-16 (×3): 1 [drp] via OPHTHALMIC
  Filled 2017-09-15 (×4): qty 1

## 2017-09-15 MED ORDER — DILTIAZEM HCL-DEXTROSE 100-5 MG/100ML-% IV SOLN (PREMIX)
5.0000 mg/h | Freq: Once | INTRAVENOUS | Status: AC
  Administered 2017-09-15: 5 mg/h via INTRAVENOUS
  Filled 2017-09-15: qty 100

## 2017-09-15 MED ORDER — ONDANSETRON HCL 4 MG PO TABS
4.0000 mg | ORAL_TABLET | Freq: Four times a day (QID) | ORAL | Status: DC | PRN
Start: 2017-09-15 — End: 2017-09-17

## 2017-09-15 MED ORDER — SENNOSIDES-DOCUSATE SODIUM 8.6-50 MG PO TABS: 1.0000 | ORAL_TABLET | Freq: Every evening | ORAL | Status: DC | PRN

## 2017-09-15 MED ORDER — HYDROCODONE-ACETAMINOPHEN 5-325 MG PO TABS
1.0000 | ORAL_TABLET | ORAL | Status: DC | PRN
  Administered 2017-09-15: 1 via ORAL
  Filled 2017-09-15: qty 1

## 2017-09-15 MED ORDER — ADULT MULTIVITAMIN W/MINERALS CH
1.0000 | ORAL_TABLET | Freq: Every day | ORAL | Status: DC
  Administered 2017-09-16: 1 via ORAL
  Filled 2017-09-15 (×2): qty 1

## 2017-09-15 MED ORDER — DILTIAZEM HCL-DEXTROSE 100-5 MG/100ML-% IV SOLN (PREMIX)
5.0000 mg/h | INTRAVENOUS | Status: DC
  Administered 2017-09-15 – 2017-09-16 (×3): 15 mg/h via INTRAVENOUS
  Filled 2017-09-15 (×4): qty 100

## 2017-09-15 MED ORDER — ACETAMINOPHEN 325 MG PO TABS: 650.0000 mg | ORAL_TABLET | Freq: Four times a day (QID) | ORAL | Status: DC | PRN

## 2017-09-15 MED ORDER — APIXABAN 5 MG PO TABS
5.0000 mg | ORAL_TABLET | Freq: Two times a day (BID) | ORAL | Status: DC
  Administered 2017-09-15 – 2017-09-17 (×4): 5 mg via ORAL
  Filled 2017-09-15 (×4): qty 1

## 2017-09-15 MED ORDER — DEXTROSE 5 % IV SOLN
1.0000 g | Freq: Once | INTRAVENOUS | Status: AC
  Administered 2017-09-15: 1 g via INTRAVENOUS
  Filled 2017-09-15: qty 10

## 2017-09-15 MED ORDER — ACETAMINOPHEN 650 MG RE SUPP: 650.0000 mg | Freq: Four times a day (QID) | RECTAL | Status: DC | PRN

## 2017-09-15 MED ORDER — POLYVINYL ALCOHOL 1.4 % OP SOLN: 1.0000 [drp] | Freq: Two times a day (BID) | OPHTHALMIC | Status: DC

## 2017-09-15 MED ORDER — LEVOTHYROXINE SODIUM 175 MCG PO TABS
175.0000 ug | ORAL_TABLET | Freq: Every day | ORAL | Status: DC
  Administered 2017-09-16: 175 ug via ORAL
  Filled 2017-09-15: qty 1

## 2017-09-15 MED ORDER — BISACODYL 10 MG RE SUPP: 10.0000 mg | Freq: Every day | RECTAL | Status: DC | PRN

## 2017-09-15 NOTE — ED Notes (Signed)
Patient currently in CT.

## 2017-09-15 NOTE — H&P (Signed)
History and Physical    NICA FRISKE FHQ:[id number] DOB: 05-Jun-1946 DOA: 09/15/2017   PCP: [name], MD   Patient coming from:  Home    Chief Complaint: Shortness of breath   HPI: Kayla Harrison is a 72 y.o. female with extensive medical history including anemia, atrial fibrillation status post ablation in the past, last in 2008,  June 2014, and on 07/18/2017   on oral anticoagulant with Eliquis hypertension, hyperlipidemia, history of pleural effusion status post thoracentesis in the past, history of breast, thyroid and lung cancer, hypothyroidism, status post pacemaker placement due to sinus node dysfunction, history of CVA in 2003 without residual, and most recently a history of NHL, status post 6 cycles of chemotherapy under the direction of Dr.Gudena, last on August 28, 2017, presenting with 3-day history of palpitations, as well as shortness of breath.  She denied any productive cough.  She did report intermittent palpitations as well.  She denies any fever or chills.  She did report some lower extremity swelling.  She denies any calf pain.  No recent long distance trips.  She denies any bleeding issues.  The patient reports that she has not been on any cardiac medicines, due to recent chemotherapy.  In the past, she had been on Tikosyn, as well as on amiodarone under the direction of her but none recently.  ED Course:  BP 133/87   Pulse (!) 124   Temp 98.5 F (36.9 C) (Oral)   Resp 17   Ht _0  (1.676 m)   Wt 96.6 kg (213 lb)   SpO2 100%   BMI 34.38 kg/m   Hemoglobin 10.2, glucose 102, troponin 0 0.05, BNP 250.2, UA  positive for small leukocytes EKG with atrial flutter-fibrillation, without significant changes from prior. CT angios of the chest without evidence of PE. Patient was placed on Cardizem drip, with some improvement of her initial Tachycardia but still remains in the 120s.  Cardiology evaluation is pending.   Review of Systems:  As per HPI  otherwise all other systems reviewed and are negative  Past Medical History:  Diagnosis Date  . Abnormally small mouth    per pt "I have a small mouth"  . Anemia   . Arthritis    osteoarthritis - knees   . Atrial fibrillation [facility])    ablation- 2x's-- 1st time- [facility], 2nd event at [facility] in 2008. Convergent ablation at [facility] 6/14  . Blood transfusion without reported diagnosis   . Breast cancer ([place])    Dr Margot Chimes, total thyroidectomy- 1999- for cancer  . Colon polyp    Dr Earlean Shawl  . Complication of anesthesia    Ketamine produces LSD reaction, bright colored nightmarish experience   . Dyslipidemia   . Dysrhythmia    a fib with internal/external ablation, currently NSR  . H/O pleural effusion    s/p thoracentesis w 3221m withdrawn  . Hepatitis    [place] ?hepatitis   . History of dysphagia    due to radiation therapy  . History of kidney stones   . Hx of thyroid cancer    Dr SForde Dandy . Hyperlipidemia   . Hypothyroidism   . Lung [place]   radiation RX completed 03/04/17; will start chemo 6/27  . Morbid [place]    Status post lap band surgery  . Normal cardiac stress test    2008- cardiac stress/echo  . Pacemaker-[employer]   . Pneumonia  2010   [facility]- during that event had to be cardioverted   . Renal calculi   . Right shoulder pain    needs shoulder replacement  . Sinus node dysfunction ([place])    Complicating convergent ablation 6/14  . Stroke [facility])    2003- [place] x2  . Wears glasses    readers    Past Surgical History:  Procedure Laterality Date  . ABDOMINAL HYSTERECTOMY  1983  . afib ablation     X 2; [facility] & Dr Caryl Comes  . APPENDECTOMY    . BONE MARROW BIOPSY  02/21/2017  . BREAST LUMPECTOMY  2010  . bso  1998  . CARDIOVERSION  10/09/2012   Procedure: CARDIOVERSION;  Surgeon: Minus Breeding, MD;  Location: [place];  Service: Cardiovascular;  Laterality: N/A;  . CARDIOVERSION  10/09/2012    Procedure: CARDIOVERSION;  Surgeon: Minus Breeding, MD;  Location: [facility] ENDOSCOPY;  Service: Cardiovascular;  Laterality: N/A;  [name] gave the ok to add pt to the add on , but we must check to find out if the can add pt on at [phone] ( [phone])  . CARDIOVERSION N/A 11/20/2012   Procedure: CARDIOVERSION;  Surgeon: Fay Records, MD;  Location: [facility] ENDOSCOPY;  Service: Cardiovascular;  Laterality: N/A;  . CARDIOVERSION N/A 07/18/2017   Procedure: CARDIOVERSION;  Surgeon: Thayer Headings, MD;  Location: [place];  Service: Cardiovascular;  Laterality: N/A;  . CHOLECYSTECTOMY    . COLONOSCOPY W/ POLYPECTOMY     Dr Earlean Shawl  . convergent    . CYSTOSCOPY N/A 02/06/2015   Procedure: CYSTOSCOPY;  Surgeon: Kathie Rhodes, MD;  Location: [facility];  Service: Urology;  Laterality: N/A;  . CYSTOSCOPY WITH RETROGRADE PYELOGRAM, URETEROSCOPY AND STENT PLACEMENT Right 02/06/2015   Procedure: RETROGRADE PYELOGRAM, RIGHT URETEROSCOPY STENT PLACEMENT;  Surgeon: Kathie Rhodes, MD;  Location: [facility];  Service: Urology;  Laterality: Right;  . CYSTOSCOPY WITH RETROGRADE PYELOGRAM, URETEROSCOPY AND STENT PLACEMENT Right 03/07/2017   Procedure: CYSTOSCOPY WITH RIGHT RETROGRADE PYELOGRAM,RIGHT  URETEROSCOPYLASER LITHOTRIPSY  AND STENT PLACEMENT AND STONE BASKETRY;  Surgeon: Kathie Rhodes, MD;  Location: [place];  Service: Urology;  Laterality: Right;  . EYE SURGERY     cataract surgery  . HOLMIUM LASER APPLICATION N/A 1/61/0960   Procedure: HOLMIUM LASER APPLICATION;  Surgeon: Kathie Rhodes, MD;  Location: [facility];  Service: Urology;  Laterality: N/A;  . HOLMIUM LASER APPLICATION Right 4/54/0981   Procedure: HOLMIUM LASER APPLICATION;  Surgeon: Kathie Rhodes, MD;  Location: [facility];  Service: Urology;  Laterality: Right;  . IR FLUORO GUIDE PORT INSERTION RIGHT  02/24/2017  . IR PATIENT EVAL TECH 0-60 MINS  03/11/2017  . IR US GUIDE VASC ACCESS RIGHT  02/24/2017  . JOINT REPLACEMENT  04/13/12   Right  Knee replacement  . KNEE ARTHROSCOPY     bilateral  . LAPAROSCOPIC GASTRIC BANDING  07/10/2010  . LAPAROTOMY     for ruptured ovary and ovarian artery   . PACEMAKER INSERTION  03/10/13   [facility]  . POCKET REVISION N/A 12/08/2013   Procedure: POCKET REVISION;  Surgeon: Deboraha Sprang, MD;  Location: [facility] CATH LAB;  Service: Cardiovascular;  Laterality: N/A;  . THYROIDECTOMY  1998   Dr Margot Chimes  . TONSILLECTOMY    . TOTAL KNEE ARTHROPLASTY  04/13/2012   Procedure: TOTAL KNEE ARTHROPLASTY;  Surgeon: Rudean Haskell, MD;  Location: [place];  Service: Orthopedics;  Laterality: Right;  . Transabdominal ablation  03/02/13   [facility]  .  VIDEO BRONCHOSCOPY WITH ENDOBRONCHIAL ULTRASOUND N/A 02/07/2017   Procedure: VIDEO BRONCHOSCOPY WITH ENDOBRONCHIAL ULTRASOUND;  Surgeon: Marshell Garfinkel, MD;  Location: [place];  Service: Pulmonary;  Laterality: N/A;    Social History Social History   Socioeconomic History  . Marital status: Single    Spouse name: Not on file  . Number of children: 0  . Years of education: Not on file  . Highest education level: Not on file  Social Needs  . Financial resource strain: Not on file  . Food insecurity - worry: Not on file  . Food insecurity - inability: Not on file  . Transportation needs - medical: Not on file  . Transportation needs - non-medical: Not on file  Occupational History  . 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 Smoking status: Never Smoker  . Smokeless tobacco: Never Used  Substance and Sexual Activity  . Alcohol use: No  . Drug use: No  . Sexual activity: Not on file  Other Topics Concern  . Not on file  Social History Narrative   SINGLE    NO CHILDREN   NEVER SMOKED   EXERCISE 3 X WK           Allergies  Allergen Reactions  . Tikosyn [Dofetilide] Other (See Comments)    Prolonged QT interval Prolonged QT interval  . Xarelto [Rivaroxaban] Other (See Comments)    Nose Bleed X 6 hrs ;  packing in ER  . Epinephrine Other (See Comments) and Rash    Dental form only (liquid). Patient stated she will become "out of it" she can hear you but cannot respond in normal fashion.  Marland Kitchen Ketamine     Hallucinations  . Oral Anesthetic [Benzocaine]   . Adhesive [Tape] Rash    Paper tape as well    Family History  Problem Relation Age of Onset  . Heart disease Father 56       MI _0   . Colon cancer Father        COLON  . Heart attack Father   . Diabetes Sister   . Diabetes Brother   . Diabetes Paternal Aunt   . Diabetes Paternal Grandmother       Prior to Admission medications   Medication Sig Start Date End Date Taking? Authorizing Provider  cycloSPORINE (RESTASIS) 0.05 % ophthalmic emulsion Place 1 drop into both eyes 2 (two) times daily.   Yes [provider]  diphenhydramine-acetaminophen (TYLENOL PM) 25-500 MG TABS tablet Take 1 tablet by mouth at bedtime as needed.   Yes [provider]  ELIQUIS 5 MG TABS tablet TAKE 1 TABLET BY MOUTH TWO  TIMES DAILY 08/26/17  Yes Deboraha Sprang, MD  levothyroxine (SYNTHROID, LEVOTHROID) 175 MCG tablet Take 175 mcg by mouth daily before breakfast.   Yes [provider]  lidocaine-prilocaine (EMLA) cream Apply to affected area once 02/25/17  Yes Nicholas Lose, MD  [employer] [facility] Friar) 5 % SOLN Apply 1 drop to eye 2 (two) times daily.   Yes [provider]  Multiple Vitamin (MULTIVITAMIN) tablet Take 1 tablet by mouth daily.    Yes [provider]  acetaminophen (TYLENOL 8 HOUR) 650 MG CR tablet Take 1 tablet (650 mg total) by mouth every 8 (eight) hours as needed for pain. Patient not taking: Reported on 08/28/2017 03/16/17   Varney Biles, MD  allopurinol (ZYLOPRIM) 300 MG tablet Take 1 tablet (300 mg total) by mouth daily. Patient not taking:  Reported on 08/28/2017 04/28/17   Nicholas Lose, MD  dicyclomine (BENTYL) 20 MG tablet Take 1 tablet (20 mg total) by mouth every 6 (six) hours. As  needed Patient not taking: Reported on 08/28/2017 04/09/17   Nicholas Lose, MD  diphenoxylate-atropine (LOMOTIL) 2.5-0.025 MG tablet TAKE 2 TABLETS BY MOUTH TWICE DAILY AS NEEDED FOR DIARRHEA OR LOOSE STOOL Patient not taking: Reported on 08/28/2017 05/27/17   Nicholas Lose, MD  HYDROcodone-homatropine [facility]) 5-1.5 MG/5ML syrup Take 5 mLs by mouth every 6 (six) hours as needed for cough. Patient not taking: Reported on 08/28/2017 07/16/17   Nicholas Lose, MD  LORazepam (ATIVAN) 0.5 MG tablet Take 1 tablet (0.5 mg total) by mouth at bedtime. Patient not taking: Reported on 08/28/2017 02/25/17   Nicholas Lose, MD  ondansetron (ZOFRAN) 8 MG tablet Take 1 tablet (8 mg total) by mouth 2 (two) times daily as needed for refractory nausea / vomiting. Start on day 3 after cyclophosphamide chemotherapy. Patient not taking: Reported on 08/28/2017 02/25/17   Nicholas Lose, MD  Oxycodone HCl 10 MG TABS Take 1 tablet (10 mg total) by mouth every 4 (four) hours as needed. Patient not taking: Reported on 08/28/2017 03/07/17   Kathie Rhodes, MD  sucralfate (CARAFATE) 1 g tablet Take 1 tablet (1 g total) by mouth 4 (four) times daily. Patient not taking: Reported on 08/28/2017 02/14/17   Kyung Rudd, MD    Physical Exam:  Vitals:   09/15/17 1215 09/15/17 1230 09/15/17 1331 09/15/17 1400  BP: (!) 120/96 (!) 122/92 (!) 134/97 133/87  Pulse: (!) 125 (!) 126 (!) 122 (!) 124  Resp: (!) 23 19 (!) 25 17  Temp:      TempSrc:      SpO2: 93% 98% 100% 100%  Weight:      Height:       Constitutional: NAD, calm, comfortable Eyes: PERRL, lids and conjunctivae normal ENMT: Mucous membranes are moist, without exudate or lesions  Neck: normal, supple, no masses, no thyromegaly Respiratory: clear to auscultation bilaterally, no wheezing, no crackles. Normal respiratory effort  Cardiovascular tacky rate and   rhythm, 1 out of 6 murmur, rubs or gallops.  Trace of bilateral lower extremity edema. 2+ pedal pulses. No carotid bruits.   Abdomen: Soft, non tender, No hepatosplenomegaly. Bowel sounds positive.  Musculoskeletal: no clubbing / cyanosis. Moves all extremities Skin: no jaundice, No lesions.  Patient is alopecic Neurologic: Sensation intact  Strength equal in all extremities Psychiatric:   Alert and oriented x 3. Normal mood.     Labs on Admission: I have personally reviewed following labs and imaging studies  CBC: Recent Labs  Lab 09/15/17 0928  WBC 4.2  NEUTROABS 3.0  HGB 10.2*  HCT 32.6*  MCV 99.7  PLT 628    Basic Metabolic Panel: Recent Labs  Lab 09/15/17 0928  NA 140  K 4.1  CL 109  CO2 24  GLUCOSE 102*  BUN 11  CREATININE 0.93  CALCIUM 8.8*  MG 1.9    GFR: Estimated Creatinine Clearance: 63.1 mL/min (by C-G formula based on SCr of 0.93 mg/dL).  Liver Function Tests: Recent Labs  Lab 09/15/17 0928  AST 16  ALT 15  ALKPHOS 62  BILITOT 1.2  PROT 5.5*  ALBUMIN 3.0*   No results for input(s): LIPASE, AMYLASE in the last 168 hours. No results for input(s): AMMONIA in the last 168 hours.  Coagulation Profile: No results for input(s): INR, PROTIME in the last 168 hours.  Cardiac Enzymes: Recent Labs  Lab 09/15/17 0928  TROPONINI 0.05*    BNP (last 3 results) No results for input(s): PROBNP in the last 8760 hours.  HbA1C: No results for input(s): HGBA1C in the last 72 hours.  CBG: No results for input(s): GLUCAP in the last 168 hours.  Lipid Profile: No results for input(s): CHOL, HDL, LDLCALC, TRIG, CHOLHDL, LDLDIRECT in the last 72 hours.  Thyroid Function Tests: No results for input(s): TSH, T4TOTAL, FREET4, T3FREE, THYROIDAB in the last 72 hours.  Anemia Panel: No results for input(s): VITAMINB12, FOLATE, FERRITIN, TIBC, IRON, RETICCTPCT in the last 72 hours.  Urine analysis:    Component Value Date/Time   COLORURINE YELLOW 09/15/2017 0928   APPEARANCEUR HAZY (A) 09/15/2017 0928   LABSPEC 1.015 09/15/2017 0928   PHURINE 6.0 09/15/2017 0928    GLUCOSEU NEGATIVE 09/15/2017 0928   GLUCOSEU Color Interference (A) 11/15/2014 1401   HGBUR LARGE (A) 09/15/2017 0928   HGBUR large 04/29/2008 1308   BILIRUBINUR NEGATIVE 09/15/2017 0928   KETONESUR NEGATIVE 09/15/2017 0928   PROTEINUR 100 (A) 09/15/2017 0928   UROBILINOGEN Color Interference (A) 11/15/2014 1401   NITRITE NEGATIVE 09/15/2017 0928   LEUKOCYTESUR SMALL (A) 09/15/2017 0928    Sepsis Labs: _0 (procalcitonin:4,lacticidven:4) )No results found for this or any previous visit (from the past 240 hour(s)).   Radiological Exams on Admission: Ct Angio Chest Pe W And/or Wo Contrast  Result Date: 09/15/2017 CLINICAL DATA:  Difficulty breathing. EXAM: CT ANGIOGRAPHY CHEST WITH CONTRAST TECHNIQUE: Multidetector CT imaging of the chest was performed using the standard protocol during bolus administration of intravenous contrast. Multiplanar CT image reconstructions and MIPs were obtained to evaluate the vascular anatomy. CONTRAST:  144m ISOVUE-370 IOPAMIDOL (ISOVUE-370) INJECTION 76% COMPARISON:  CT scan of May 27, 2017. PET scan of August 27, 2017. FINDINGS: Cardiovascular: Satisfactory opacification of the pulmonary arteries to the segmental level. No evidence of pulmonary embolism. No pericardial effusion. Mediastinum/Nodes: Stable right peritracheal in subcarinal adenopathy is noted consistent with history of lymphoma. Esophagus is unremarkable. Status post thyroidectomy. Lungs/Pleura: No pneumothorax is noted. Bilateral pleural effusions are noted with right greater than left, with adjacent subsegmental atelectasis. Stable probable radiation fibrosis is noted medially in right upper lobe. Upper Abdomen: Status post lap band procedure. Musculoskeletal: No chest wall abnormality. No acute or significant osseous findings. Review of the MIP images confirms the above findings. IMPRESSION: No definite evidence of pulmonary embolus. Moderate right pleural effusion is noted as well as  mild left pleural effusion, with adjacent subsegmental atelectasis. Probable radiation fibrosis is noted medially in right upper lobe. Stable mediastinal adenopathy compared to prior exam. Electronically Signed   By: JMarijo Conception M.D.   On: 09/15/2017 13:41   Dg Chest Portable 1 View  Result Date: 09/15/2017 CLINICAL DATA:  Shortness of Breath.  B-cell lymphoma EXAM: PORTABLE CHEST 1 VIEW COMPARISON:  PET-CT August 27, 2017; chest radiograph June 04, 2017 FINDINGS: There is scarring with presumed radiation fibrosis in the right medial thorax comfortably the right paratracheal region. There is no edema or consolidation. There is a degree of volume loss on the right. Heart size and pulmonary vascularity are normal. No adenopathy is appreciable by radiography. Port-A-Cath tip is in the superior vena cava. Pacemaker leads are attached to the right atrium and right ventricle. There is a left atrial appendage clamp. There is postoperative change in the upper thoracic region in the midline. No bone lesions. IMPRESSION: Radiation fibrosis right peritracheal region. There is volume loss on the right. There is no  edema or consolidation. Stable cardiac silhouette. No adenopathy demonstrable. No pneumothorax. Electronically Signed   By: Lowella Grip III M.D.   On: 09/15/2017 09:46    EKG: Independently reviewed.  Assessment/Plan Active Problems:   HYPERLIPIDEMIA   Morbid obesity (HCC)   Atrial fibrillation (HCC)   Chronic diastolic heart failure (HCC)   BREAST CANCER, HX OF   CARCINOMA, THYROID GLAND, HX OF   COLONIC POLYPS, HX OF   Long term (current) use of anticoagulants   Pleural effusion   Hypothyroidism   OSA (obstructive sleep apnea)   Non-Hodgkin lymphoma, unspecified, intrathoracic lymph nodes ([place])   Port catheter in place   Atrial flutter (HCC)    Atrial Fibrillation with RVR , on chronic Eliquis.  Until recently, the patient was remaining on sinus rhythm since cardioversion  on 07/18/2017.  She had recently been placed off of amiodarone due to worsening shortness of breath.  EKG with atrial flutter-fibrillation, without significant changes from prior.CT angios of the chest without evidence of PE.Patient was placed on Cardizem drip, with some improvement of her initial Tachycardia but still remains in the 120s.  Tn 0.05 Cardiology evaluation is pending. 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  History of lung, thyroid and breast cancer, remote This can be followed as patient by oncology    Hypertension BP 133/87  Continue home anti-hypertensive medications  Other changes as per cardiology   Hyperlipidemia Continue home statins  UTI suggested by UA +  leukocytes in urine. WBC is normal      F/u urine culture Ceftriaxone IV  Follow CBC in am   Anemia of chronic disease Hemoglobin on admission 10.2 at baseline  Repeat CBC in am  No transfusion is indicated at this time   Hypothyroidism: Continue home Synthroid TSH pending   Chronic diastolic heart failure,  CXR neg for fluid overload   BNP 250  Wt 213 lbs   Daily weights, strict I/O CXR in am   OSA Continue CPAP nightly   DVT prophylaxis:   Code Status:     Family Communication:  Discussed with patient Disposition Plan: Expect patient to be discharged to home after condition improves Consults called:     Admission status:    Sharene Butters, PA-C [facility]   09/15/2017, 2:20 PM

## 2017-09-15 NOTE — ED Triage Notes (Signed)
Pt states sob and hr of 130 and irregular x 3 days.  States hx of same with multiple cardioversions.  Last chemo for lymphoma was October and pt is now in remission x 3 weeks.

## 2017-09-15 NOTE — ED Notes (Signed)
Patient ambulated with cane and stand-by assist to restroom. Independent with urination. Gait slow, steady.

## 2017-09-15 NOTE — Progress Notes (Signed)
CRITICAL VALUE ALERT  Critical Value:  Trop 0.05  Date & Time Notied:  09/15/17 1640  Provider Notified: md aware

## 2017-09-15 NOTE — ED Notes (Signed)
Patient refused to allow RN to start Rocephin IV stating that she had just finished antibiotics recently. Requested to speak to EDP. Dr. Lita Mains notified.

## 2017-09-15 NOTE — ED Notes (Signed)
Waiting on supplies for RN to be able to access power port. Labs will be drawn & Cardizem started at that time.

## 2017-09-15 NOTE — ED Notes (Signed)
Dr. Lita Mains at bedside at this time.

## 2017-09-15 NOTE — ED Provider Notes (Signed)
[facility] EMERGENCY DEPARTMENT Provider Note   CSN: [id number] Arrival date & time: 09/15/17  5397     History   Chief Complaint Chief Complaint  Patient presents with  . Shortness of Breath    HPI Kayla Harrison is a 71 y.o. female.  HPI She presents with 3 days of palpitations and shortness of breath.  She has had a cough productive of intermittent sputum.  No fevers or chills.  She complains of increased lower extremity swelling.  History of atrial fibrillation and is on anticoagulant. Past Medical History:  Diagnosis Date  . Abnormally small mouth    per pt "I have a small mouth"  . Anemia   . Arthritis    osteoarthritis - knees   . Atrial fibrillation [facility])    ablation- 2x's-- 1st time- [facility], 2nd event at [facility] in 2008. Convergent ablation at [facility] 6/14  . Blood transfusion without reported diagnosis   . Breast cancer ([place])    Dr Margot Chimes, total thyroidectomy- 1999- for cancer  . Colon polyp    Dr Earlean Shawl  . Complication of anesthesia    Ketamine produces LSD reaction, bright colored nightmarish experience   . Dyslipidemia   . Dysrhythmia    a fib with internal/external ablation, currently NSR  . H/O pleural effusion    s/p thoracentesis w 3283m withdrawn  . Hepatitis    [place]patitis   . History of dysphagia    due to radiation therapy  . History of kidney stones   . Hx of thyroid cancer    Dr SForde Dandy . Hyperlipidemia   . Hypothyroidism   . Lung cancer, lower lobe ([place] 01/2017   radiation RX completed 03/04/17; will start chemo 6/27  . Morbid obesity ([place]    Status post lap band surgery  . Normal cardiac stress test    2008- cardiac stress/echo  . Pacemaker-Medtronic   . [facility]   . Renal calculi   . Right shoulder pain    needs shoulder replacement  . Sinus node dysfunction ([place]    Complicating convergent ablation 6/14  .  [place]    [place]  . Wears glasses    readers    Patient Active Problem List   Diagnosis Date Noted  . Atrial fibrillation with RVR ([place] 09/15/2017  . Atrial flutter ([place] 07/17/2017  . Port catheter in place 04/02/2017  . Non-Hodgkin lymphoma, unspecified, intrathoracic lymph nodes ([place] 02/13/2017  . Mediastinal mass 02/06/2017  . 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 Abnormal chest x-ray   . Lung mass 02/03/2017  . 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  . OSA (obstructive sleep apnea) 02/03/2015  . History of renal calculi 11/16/2014  . Renal calculi 11/16/2014  . Family history of colon cancer 10/26/2014  . Encounter for therapeutic drug monitoring 11/24/2013  . Mechanical complication due to cardiac pacemaker pulse generator 11/06/2013  . Dyspnea 05/12/2013  . Hypothyroidism 04/21/2013  . Pleural effusion 04/19/2013  . Long term (current) use of anticoagulants 12/20/2010  . EPIDERMOID CYST 08/22/2010  . Chronic diastolic heart failure ([place] 08/06/2010  . SYNCOPE AND COLLAPSE 07/27/2010  . OSTEOARTHRITIS, KNEE, RIGHT 03/15/2010  . Morbid obesity ([place] 12/07/2009  . NONSPEC ELEVATION OF LEVELS OF TRANSAMINASE/LDH 09/08/2009  . CVA 08/14/2009  . BREAST CANCER, HX OF 07/25/2009  . COLONIC POLYPS, HX OF 07/25/2009  .  TUBULOVILLOUS ADENOMA, COLON 04/29/2008  . HYPERGLYCEMIA, FASTING 04/29/2008  . HYPERLIPIDEMIA 06/22/2007  . Atrial fibrillation (HCC) 06/22/2007  . CARCINOMA, THYROID GLAND, HX OF 06/22/2007    Past Surgical History:  Procedure Laterality Date  . ABDOMINAL HYSTERECTOMY  1983  . afib ablation     X 2; [facility] & Dr Klein  . APPENDECTOMY    . BONE MARROW BIOPSY  02/21/2017  . BREAST LUMPECTOMY  2010  . bso  1998  . CARDIOVERSION  10/09/2012   Procedure: CARDIOVERSION;  Surgeon: James Hochrein, MD;  Location: [facility] OR;  Service: Cardiovascular;  Laterality: N/A;  . CARDIOVERSION  10/09/2012   Procedure: CARDIOVERSION;  Surgeon:  James Hochrein, MD;  [facility]: [facility] ENDOSCOPY;  Service: Cardiovascular;  Laterality: N/A;  Mike gave the ok to add pt to the add on , but we must check to find out if the can add pt on at [phone] ( [phone])  . CARDIOVERSION N/A 11/20/2012   Procedure: CARDIOVERSION;  Surgeon: Paula V Ross, MD;  Location: [facility];  Service: Cardiovascular;  Laterality: N/A;  . CARDIOVERSION N/A 07/18/2017   Procedure: CARDIOVERSION;  Surgeon: Nahser, Philip J, MD;  Location: [facility];  Service: Cardiovascular;  Laterality: N/A;  . CHOLECYSTECTOMY    . COLONOSCOPY W/ POLYPECTOMY     Dr Medoff  . convergent    . CYSTOSCOPY N/A 02/06/2015   Procedure: CYSTOSCOPY;  Surgeon: Mark Ottelin, MD;  Location: [facility] ORS;  Service: Urology;  Laterality: N/A;  . CYSTOSCOPY WITH RETROGRADE PYELOGRAM, URETEROSCOPY AND STENT PLACEMENT Right 02/06/2015   Procedure: RETROGRADE PYELOGRAM, RIGHT URETEROSCOPY STENT PLACEMENT;  Surgeon: Mark Ottelin, MD;  Location: [facility];  Service: Urology;  Laterality: Right;  . CYSTOSCOPY WITH RETROGRADE PYELOGRAM, URETEROSCOPY AND STENT PLACEMENT Right 03/07/2017   Procedure: CYSTOSCOPY WITH RIGHT RETROGRADE PYELOGRAM,RIGHT  URETEROSCOPYLASER LITHOTRIPSY  AND STENT PLACEMENT AND STONE BASKETRY;  Surgeon: Ottelin, Mark, MD;  Location: [facility];  Service: Urology;  Laterality: Right;  . EYE SURGERY     cataract surgery  . HOLMIUM LASER APPLICATION N/A 02/06/2015   Procedure: HOLMIUM LASER APPLICATION;  Surgeon: Mark Ottelin, MD;  Location: [facility];  Service: Urology;  Laterality: N/A;  . HOLMIUM LASER APPLICATION Right 03/07/2017   Procedure: HOLMIUM LASER APPLICATION;  Surgeon: Ottelin, Mark, MD;  Location: [facility];  Service: Urology;  Laterality: Right;  . IR FLUORO GUIDE PORT INSERTION RIGHT  02/24/2017  . IR PATIENT EVAL TECH 0-60 MINS  03/11/2017  . IR US GUIDE VASC ACCESS RIGHT  02/24/2017  . JOINT REPLACEMENT  04/13/12   Right Knee replacement  . KNEE ARTHROSCOPY      bilateral  . LAPAROSCOPIC GASTRIC BANDING  07/10/2010  . LAPAROTOMY     for ruptured ovary and ovarian artery   . PACEMAKER INSERTION  03/10/13   [facility]  . POCKET REVISION N/A 12/08/2013   Procedure: POCKET REVISION;  Surgeon: Steven C Klein, MD;  [facility]: [facility]ATH [facility];  Service: Cardiovascular;  Laterality: N/A;  . THYROIDECTOMY  1998   Dr Streck  . TONSILLECTOMY    . TOTAL KNEE ARTHROPLASTY  04/13/2012   Procedure: TOTAL KNEE ARTHROPLASTY;  Surgeon: Stephen D Lucey, MD;  Location: [facility] OR;  Service: Orthopedics;  Laterality: Right;  . Transabdominal ablation  03/02/13   [facility]  . VIDEO BRONCHOSCOPY WITH ENDOBRONCHIAL ULTRASOUND N/A 02/07/2017   Procedure: VIDEO BRONCHOSCOPY WITH ENDOBRONCHIAL ULTRASOUND;  Surgeon: Mannam, Praveen, MD;  Location: [facility] OR;  Service: Pulmonary;  Laterality: N/A;      OB History    No data available       Home Medications    Prior to Admission medications   Medication Sig Start Date End Date Taking? Authorizing Provider  cycloSPORINE (RESTASIS) 0.05 % ophthalmic emulsion Place 1 drop into both eyes 2 (two) times daily.   Yes [provider]  diphenhydramine-acetaminophen (TYLENOL PM) 25-500 MG TABS tablet Take 1 tablet by mouth at bedtime as needed.   Yes [provider]  ELIQUIS 5 MG TABS tablet TAKE 1 TABLET BY MOUTH TWO  TIMES DAILY 08/26/17  Yes Deboraha Sprang, MD  levothyroxine (SYNTHROID, LEVOTHROID) 175 MCG tablet Take 175 mcg by mouth daily before breakfast.   Yes [provider]  lidocaine-prilocaine (EMLA) cream Apply to affected area once 02/25/17  Yes Nicholas Lose, MD  Lifitegrast [facility]) 5 % SOLN Apply 1 drop to eye 2 (two) times daily.   Yes [provider]  Multiple Vitamin (MULTIVITAMIN) tablet Take 1 tablet by mouth daily.    Yes [provider]  acetaminophen (TYLENOL 8 HOUR) 650 MG CR tablet Take 1 tablet (650 mg total) by mouth every 8 (eight) hours as needed for pain. Patient not taking:  Reported on 08/28/2017 03/16/17   Varney Biles, MD  allopurinol (ZYLOPRIM) 300 MG tablet Take 1 tablet (300 mg total) by mouth daily. Patient not taking: Reported on 08/28/2017 04/28/17   Nicholas Lose, MD  dicyclomine (BENTYL) 20 MG tablet Take 1 tablet (20 mg total) by mouth every 6 (six) hours. As needed Patient not taking: Reported on 08/28/2017 04/09/17   Nicholas Lose, MD  diphenoxylate-atropine (LOMOTIL) 2.5-0.025 MG tablet TAKE 2 TABLETS BY MOUTH TWICE DAILY AS NEEDED FOR DIARRHEA OR LOOSE STOOL Patient not taking: Reported on 08/28/2017 05/27/17   Nicholas Lose, MD  HYDROcodone-homatropine [facility]) 5-1.5 MG/5ML syrup Take 5 mLs by mouth every 6 (six) hours as needed for cough. Patient not taking: Reported on 08/28/2017 07/16/17   Nicholas Lose, MD  LORazepam (ATIVAN) 0.5 MG tablet Take 1 tablet (0.5 mg total) by mouth at bedtime. Patient not taking: Reported on 08/28/2017 02/25/17   Nicholas Lose, MD  ondansetron (ZOFRAN) 8 MG tablet Take 1 tablet (8 mg total) by mouth 2 (two) times daily as needed for refractory nausea / vomiting. Start on day 3 after cyclophosphamide chemotherapy. Patient not taking: Reported on 08/28/2017 02/25/17   Nicholas Lose, MD  Oxycodone HCl 10 MG TABS Take 1 tablet (10 mg total) by mouth every 4 (four) hours as needed. Patient not taking: Reported on 08/28/2017 03/07/17   Kathie Rhodes, MD  sucralfate (CARAFATE) 1 g tablet Take 1 tablet (1 g total) by mouth 4 (four) times daily. Patient not taking: Reported on 08/28/2017 02/14/17   Kyung Rudd, MD    Family History Family History  Problem Relation Age of Onset  . Heart disease Father 33       MI _0   . Colon cancer Father        COLON  . Heart attack Father   . Diabetes Sister   . Diabetes Brother   . Diabetes Paternal Aunt   . Diabetes Paternal Grandmother     Social History Social History   Tobacco Use  . Smoking status: Never Smoker  . Smokeless tobacco: Never Used  Substance Use Topics  . Alcohol  use: No  . Drug use: No     Allergies   Tikosyn [dofetilide]; Xarelto [rivaroxaban]; Epinephrine; Ketamine; Oral anesthetic [benzocaine]; and Adhesive [tape]   Review of  Systems Review of Systems  Constitutional: Negative for chills and fever.  HENT: Positive for postnasal drip.   Respiratory: Positive for cough and shortness of breath.   Cardiovascular: Positive for palpitations and leg swelling. Negative for chest pain.  Gastrointestinal: Negative for abdominal pain, constipation, diarrhea, nausea and vomiting.  Musculoskeletal: Negative for back pain, myalgias, neck pain and neck stiffness.  Skin: Negative for rash and wound.  Neurological: Negative for dizziness, weakness, light-headedness, numbness and headaches.  All other systems reviewed and are negative.    Physical Exam Updated Vital Signs BP 134/90   Pulse (!) 123   Temp 98.5 F (36.9 C) (Oral)   Resp 18   Ht 5' 6" (1.676 m)   Wt 96.6 kg (213 lb)   SpO2 97%   BMI 34.38 kg/m   Physical Exam  Constitutional: She is oriented to person, place, and time. She appears well-developed and well-nourished.  Non-toxic appearance. She does not appear ill. She appears distressed. She is not intubated.  HENT:  Head: Normocephalic and atraumatic.  Mouth/Throat: Oropharynx is clear and moist.  Eyes: EOM are normal. Pupils are equal, round, and reactive to light.  Neck: Normal range of motion. Neck supple.  Cardiovascular:  Tachycardia.  Irregularly irregular  Pulmonary/Chest: Effort normal and breath sounds normal. No accessory muscle usage or stridor. No apnea, no tachypnea and no bradypnea. She is not intubated. No respiratory distress. She has no decreased breath sounds.  Abdominal: Soft. Bowel sounds are normal. There is no tenderness. There is no rebound and no guarding.  Musculoskeletal: Normal range of motion. She exhibits no tenderness.       Right lower leg: She exhibits edema. She exhibits no tenderness.        Left lower leg: She exhibits edema. She exhibits no tenderness.  Neurological: She is alert and oriented to person, place, and time.  Moves all extremities without deficit.  Sensation fully intact.  Skin: Skin is warm and dry. Capillary refill takes less than 2 seconds. No rash noted. No erythema.  Psychiatric: She has a normal mood and affect. Her behavior is normal.  Nursing note and vitals reviewed.    ED Treatments / Results  Labs (all labs ordered are listed, but only abnormal results are displayed) Labs Reviewed  CBC WITH DIFFERENTIAL/PLATELET - Abnormal; Notable for the following components:      Result Value   RBC 3.27 (*)    Hemoglobin 10.2 (*)    HCT 32.6 (*)    RDW 15.9 (*)    All other components within normal limits  COMPREHENSIVE METABOLIC PANEL - Abnormal; Notable for the following components:   Glucose, Bld 102 (*)    Calcium 8.8 (*)    Total Protein 5.5 (*)    Albumin 3.0 (*)    GFR calc non Af Amer 60 (*)    All other components within normal limits  TROPONIN I - Abnormal; Notable for the following components:   Troponin I 0.05 (*)    All other components within normal limits  BRAIN NATRIURETIC PEPTIDE - Abnormal; Notable for the following components:   B Natriuretic Peptide 250.2 (*)    All other components within normal limits  URINALYSIS, ROUTINE W REFLEX MICROSCOPIC - Abnormal; Notable for the following components:   APPearance HAZY (*)    Hgb urine dipstick LARGE (*)    Protein, ur 100 (*)    Leukocytes, UA SMALL (*)    Bacteria, UA FEW (*)    Squamous Epithelial /   LPF 0-5 (*)    All other components within normal limits  URINE CULTURE  MAGNESIUM  TROPONIN I  TROPONIN I  TROPONIN I  TSH  T4, FREE    EKG  EKG Interpretation  Date/Time:  Monday September 15 2017 13:31:25 EST Ventricular Rate:  123 PR Interval:    QRS Duration: 76 QT Interval:  351 QTC Calculation: 492 R Axis:   48 Text Interpretation:  Atrial flutter/fibrillation Low  voltage, precordial leads Anteroseptal infarct, old Confirmed by Julianne Rice [phone]) on 09/15/2017 3:08:33 PM       Radiology Ct Angio Chest Pe W And/or Wo Contrast  Result Date: 09/15/2017 CLINICAL DATA:  Difficulty breathing. EXAM: CT ANGIOGRAPHY CHEST WITH CONTRAST TECHNIQUE: Multidetector CT imaging of the chest was performed using the standard protocol during bolus administration of intravenous contrast. Multiplanar CT image reconstructions and MIPs were obtained to evaluate the vascular anatomy. CONTRAST:  156m ISOVUE-370 IOPAMIDOL (ISOVUE-370) INJECTION 76% COMPARISON:  CT scan of May 27, 2017. PET scan of August 27, 2017. FINDINGS: Cardiovascular: Satisfactory opacification of the pulmonary arteries to the segmental level. No evidence of pulmonary embolism. No pericardial effusion. Mediastinum/Nodes: Stable right peritracheal in subcarinal adenopathy is noted consistent with history of lymphoma. Esophagus is unremarkable. Status post thyroidectomy. Lungs/Pleura: No pneumothorax is noted. Bilateral pleural effusions are noted with right greater than left, with adjacent subsegmental atelectasis. Stable probable radiation fibrosis is noted medially in right upper lobe. Upper Abdomen: Status post lap band procedure. Musculoskeletal: No chest wall abnormality. No acute or significant osseous findings. Review of the MIP images confirms the above findings. IMPRESSION: No definite evidence of pulmonary embolus. Moderate right pleural effusion is noted as well as mild left pleural effusion, with adjacent subsegmental atelectasis. Probable radiation fibrosis is noted medially in right upper lobe. Stable mediastinal adenopathy compared to prior exam. Electronically Signed   By: JMarijo Conception M.D.   On: 09/15/2017 13:41   Dg Chest Portable 1 View  Result Date: 09/15/2017 CLINICAL DATA:  Shortness of Breath.  B-cell lymphoma EXAM: PORTABLE CHEST 1 VIEW COMPARISON:  PET-CT August 27, 2017;  chest radiograph June 04, 2017 FINDINGS: There is scarring with presumed radiation fibrosis in the right medial thorax comfortably the right paratracheal region. There is no edema or consolidation. There is a degree of volume loss on the right. Heart size and pulmonary vascularity are normal. No adenopathy is appreciable by radiography. Port-A-Cath tip is in the superior vena cava. Pacemaker leads are attached to the right atrium and right ventricle. There is a left atrial appendage clamp. There is postoperative change in the upper thoracic region in the midline. No bone lesions. IMPRESSION: Radiation fibrosis right peritracheal region. There is volume loss on the right. There is no edema or consolidation. Stable cardiac silhouette. No adenopathy demonstrable. No pneumothorax. Electronically Signed   By: WLowella GripIII M.D.   On: 09/15/2017 09:46    Procedures Procedures (including critical care time)  Medications Ordered in ED Medications  cycloSPORINE (RESTASIS) 0.05 % ophthalmic emulsion 1 drop (not administered)  apixaban (ELIQUIS) tablet 5 mg (not administered)  levothyroxine (SYNTHROID, LEVOTHROID) tablet 175 mcg (not administered)  Lifitegrast 5 % SOLN 1 drop (not administered)  multivitamin tablet 1 tablet (not administered)  acetaminophen (TYLENOL) tablet 650 mg (not administered)    Or  acetaminophen (TYLENOL) suppository 650 mg (not administered)  senna-docusate (Senokot-S) tablet 1 tablet (not administered)  bisacodyl (DULCOLAX) suppository 10 mg (not administered)  ondansetron (ZOFRAN) tablet 4 mg (not administered)  Or  ondansetron (ZOFRAN) injection 4 mg (not administered)  HYDROcodone-acetaminophen (NORCO/VICODIN) 5-325 MG per tablet 1-2 tablet (not administered)  cefTRIAXone (ROCEPHIN) 1 g in dextrose 5 % 50 mL IVPB (not administered)  diltiazem (CARDIZEM) 100 mg in dextrose 5% 100mL (1 mg/mL) infusion (5 mg/hr Intravenous Transfusing/Transfer 09/15/17 1503)    cefTRIAXone (ROCEPHIN) 1 g in dextrose 5 % 50 mL IVPB (0 g Intravenous Stopped 09/15/17 1321)  iopamidol (ISOVUE-370) 76 % injection (100 mLs  Contrast Given 09/15/17 1254)    CRITICAL CARE Performed by:   Total critical care time: 35 minutes Critical care time was exclusive of separately billable procedures and treating other patients. Critical care was necessary to treat or prevent imminent or life-threatening deterioration. Critical care was time spent personally by me on the following activities: development of treatment plan with patient and/or surrogate as well as nursing, discussions with consultants, evaluation of patient's response to treatment, examination of patient, obtaining history from patient or surrogate, ordering and performing treatments and interventions, ordering and review of laboratory studies, ordering and review of radiographic studies, pulse oximetry and re-evaluation of patient's condition. Initial Impression / Assessment and Plan / ED Course  I have reviewed the triage vital signs and the nursing notes.  Pertinent labs & imaging results that were available during my care of the patient were reviewed by me and considered in my medical decision making (see chart for details).    On Cardizem drip.  Some improvement in her initial tachycardia though remains heart rate in the 120s.  CT angios chest without evidence of PE.  Patient does appear to have urinary tract infection.  Will start on Rocephin.  Discussed with hospitalist regarding admission.   Final Clinical Impressions(s) / ED Diagnoses   Final diagnoses:  Atrial fibrillation with RVR (HCC)  Urinary tract infection in female    ED Discharge Orders    None       , , MD 09/15/17 1509  

## 2017-09-15 NOTE — Telephone Encounter (Signed)
pt called that she was in A flutter with SOB.  She has not missed any eliquis.  I have asked her to go to ER for eval and DCCV.  Discussed with Amber EP, NP  Pt is agreeable.

## 2017-09-15 NOTE — Plan of Care (Signed)
Pt still in A.Fib with an accelerated HR sustaining at 123 with cardizem infusing at 37ml/hr. Pt is asymptomatic, will continue to monitor. Continuing plan of care as ordered.

## 2017-09-15 NOTE — ED Notes (Signed)
Attempted to call report x 1 to 6 [place].

## 2017-09-15 NOTE — Telephone Encounter (Signed)
-----   Message from [place], Generic sent at 09/15/2017 7:30 AM EST -----    Dr. Caryl Comes, I am experiencing pulse of 130+ and difficulty breathing. Could this be atrial flutter once more? What should I do? Can I see you today or should I go to ER or just tolerate it until I see you on Friday? It has been 3 days. Walking makes be breathless.  Kayla Harrison   Copied from [employer] 09/15/17.   I spoke with patient, she states she also called and spoke to someone on call earlier this morning, she was advised to [facility] ED, pt is getting ready to have someone take her to [facility] ED for further evaluation.

## 2017-09-16 ENCOUNTER — Observation Stay (HOSPITAL_COMMUNITY): Payer: Medicare Other

## 2017-09-16 DIAGNOSIS — Z923 Personal history of irradiation: Secondary | ICD-10-CM | POA: Diagnosis not present

## 2017-09-16 DIAGNOSIS — J90 Pleural effusion, not elsewhere classified: Secondary | ICD-10-CM | POA: Diagnosis not present

## 2017-09-16 DIAGNOSIS — N1330 Unspecified hydronephrosis: Secondary | ICD-10-CM | POA: Diagnosis present

## 2017-09-16 DIAGNOSIS — I5032 Chronic diastolic (congestive) heart failure: Secondary | ICD-10-CM

## 2017-09-16 DIAGNOSIS — Z85850 Personal history of malignant neoplasm of thyroid: Secondary | ICD-10-CM | POA: Diagnosis not present

## 2017-09-16 DIAGNOSIS — Z7901 Long term (current) use of anticoagulants: Secondary | ICD-10-CM | POA: Diagnosis not present

## 2017-09-16 DIAGNOSIS — G4733 Obstructive sleep apnea (adult) (pediatric): Secondary | ICD-10-CM | POA: Diagnosis not present

## 2017-09-16 DIAGNOSIS — I340 Nonrheumatic mitral (valve) insufficiency: Secondary | ICD-10-CM | POA: Diagnosis not present

## 2017-09-16 DIAGNOSIS — Z950 Presence of cardiac pacemaker: Secondary | ICD-10-CM | POA: Diagnosis not present

## 2017-09-16 DIAGNOSIS — D638 Anemia in other chronic diseases classified elsewhere: Secondary | ICD-10-CM | POA: Diagnosis not present

## 2017-09-16 DIAGNOSIS — Z86010 Personal history of colonic polyps: Secondary | ICD-10-CM | POA: Diagnosis not present

## 2017-09-16 DIAGNOSIS — C8510 Unspecified B-cell lymphoma, unspecified site: Secondary | ICD-10-CM | POA: Diagnosis not present

## 2017-09-16 DIAGNOSIS — I509 Heart failure, unspecified: Secondary | ICD-10-CM | POA: Diagnosis not present

## 2017-09-16 DIAGNOSIS — Z8673 Personal history of transient ischemic attack (TIA), and cerebral infarction without residual deficits: Secondary | ICD-10-CM | POA: Diagnosis not present

## 2017-09-16 DIAGNOSIS — I4892 Unspecified atrial flutter: Secondary | ICD-10-CM | POA: Diagnosis not present

## 2017-09-16 DIAGNOSIS — E890 Postprocedural hypothyroidism: Secondary | ICD-10-CM

## 2017-09-16 DIAGNOSIS — Z85118 Personal history of other malignant neoplasm of bronchus and lung: Secondary | ICD-10-CM | POA: Diagnosis not present

## 2017-09-16 DIAGNOSIS — Z79899 Other long term (current) drug therapy: Secondary | ICD-10-CM | POA: Diagnosis not present

## 2017-09-16 DIAGNOSIS — N390 Urinary tract infection, site not specified: Secondary | ICD-10-CM | POA: Diagnosis present

## 2017-09-16 DIAGNOSIS — Z87442 Personal history of urinary calculi: Secondary | ICD-10-CM | POA: Diagnosis not present

## 2017-09-16 DIAGNOSIS — I483 Typical atrial flutter: Secondary | ICD-10-CM | POA: Diagnosis not present

## 2017-09-16 DIAGNOSIS — I110 Hypertensive heart disease with heart failure: Secondary | ICD-10-CM | POA: Diagnosis not present

## 2017-09-16 DIAGNOSIS — Z79890 Hormone replacement therapy (postmenopausal): Secondary | ICD-10-CM | POA: Diagnosis not present

## 2017-09-16 DIAGNOSIS — Z9221 Personal history of antineoplastic chemotherapy: Secondary | ICD-10-CM | POA: Diagnosis not present

## 2017-09-16 DIAGNOSIS — I481 Persistent atrial fibrillation: Secondary | ICD-10-CM | POA: Diagnosis present

## 2017-09-16 DIAGNOSIS — E785 Hyperlipidemia, unspecified: Secondary | ICD-10-CM | POA: Diagnosis present

## 2017-09-16 DIAGNOSIS — R0609 Other forms of dyspnea: Secondary | ICD-10-CM | POA: Diagnosis not present

## 2017-09-16 DIAGNOSIS — Z96651 Presence of right artificial knee joint: Secondary | ICD-10-CM | POA: Diagnosis present

## 2017-09-16 LAB — BASIC METABOLIC PANEL
ANION GAP: 7 (ref 5–15)
BUN: 10 mg/dL (ref 6–20)
CALCIUM: 8.9 mg/dL (ref 8.9–10.3)
CO2: 24 mmol/L (ref 22–32)
CREATININE: 1 mg/dL (ref 0.44–1.00)
Chloride: 105 mmol/L (ref 101–111)
GFR calc Af Amer: >60 mL/min (ref >60)
GFR, EST NON AFRICAN AMERICAN: 55 mL/min — AB (ref >60)
GLUCOSE: 131 mg/dL — AB (ref 65–99)
Potassium: 3.6 mmol/L (ref 3.5–5.1)
Sodium: 136 mmol/L (ref 135–145)

## 2017-09-16 LAB — PROTIME-INR
INR: 1.46
PROTHROMBIN TIME: 17.6 s — AB (ref 11.4–15.2)

## 2017-09-16 LAB — COMPREHENSIVE METABOLIC PANEL
ALBUMIN: 3 g/dL — AB (ref 3.5–5.0)
ALT: 14 U/L (ref 14–54)
ANION GAP: 7 (ref 5–15)
AST: 17 U/L (ref 15–41)
Alkaline Phosphatase: 67 U/L (ref 38–126)
BUN: 10 mg/dL (ref 6–20)
CHLORIDE: 106 mmol/L (ref 101–111)
CO2: 24 mmol/L (ref 22–32)
Calcium: 8.8 mg/dL — ABNORMAL LOW (ref 8.9–10.3)
Creatinine, Ser: 0.98 mg/dL (ref 0.44–1.00)
GFR calc Af Amer: >60 mL/min (ref >60)
GFR, EST NON AFRICAN AMERICAN: 56 mL/min — AB (ref >60)
Glucose, Bld: 101 mg/dL — ABNORMAL HIGH (ref 65–99)
POTASSIUM: 3.7 mmol/L (ref 3.5–5.1)
Sodium: 137 mmol/L (ref 135–145)
Total Bilirubin: 1.1 mg/dL (ref 0.3–1.2)
Total Protein: 5.4 g/dL — ABNORMAL LOW (ref 6.5–8.1)

## 2017-09-16 LAB — CBC
HCT: 32.1 % — ABNORMAL LOW (ref 36.0–46.0)
Hemoglobin: 10.3 g/dL — ABNORMAL LOW (ref 12.0–15.0)
MCH: 31.7 pg (ref 26.0–34.0)
MCHC: 32.1 g/dL (ref 30.0–36.0)
MCV: 98.8 fL (ref 78.0–100.0)
PLATELETS: 158 10*3/uL (ref 150–400)
RBC: 3.25 MIL/uL — AB (ref 3.87–5.11)
RDW: 15.8 % — AB (ref 11.5–15.5)
WBC: 5.3 10*3/uL (ref 4.0–10.5)

## 2017-09-16 LAB — URINE CULTURE: Culture: <10000 — AB

## 2017-09-16 LAB — TROPONIN I: Troponin I: 0.06 ng/mL (ref <0.03)

## 2017-09-16 LAB — MAGNESIUM: MAGNESIUM: 1.9 mg/dL (ref 1.7–2.4)

## 2017-09-16 MED ORDER — AMIODARONE HCL IN DEXTROSE 360-4.14 MG/200ML-% IV SOLN
30.0000 mg/h | INTRAVENOUS | Status: DC
  Administered 2017-09-16 (×2): 30 mg/h via INTRAVENOUS
  Filled 2017-09-16: qty 200

## 2017-09-16 MED ORDER — METOPROLOL TARTRATE 25 MG PO TABS
25.0000 mg | ORAL_TABLET | Freq: Two times a day (BID) | ORAL | Status: DC
  Administered 2017-09-16: 25 mg via ORAL
  Filled 2017-09-16: qty 1

## 2017-09-16 MED ORDER — AMIODARONE LOAD VIA INFUSION
150.0000 mg | Freq: Once | INTRAVENOUS | Status: AC | PRN
  Administered 2017-09-16: 150 mg via INTRAVENOUS
  Filled 2017-09-16: qty 83.34

## 2017-09-16 MED ORDER — AMIODARONE HCL IN DEXTROSE 360-4.14 MG/200ML-% IV SOLN
60.0000 mg/h | INTRAVENOUS | Status: AC
  Administered 2017-09-16 (×2): 60 mg/h via INTRAVENOUS
  Filled 2017-09-16: qty 200

## 2017-09-16 MED ORDER — LEVOTHYROXINE SODIUM 25 MCG PO TABS
125.0000 ug | ORAL_TABLET | Freq: Every day | ORAL | Status: DC
  Administered 2017-09-17: 07:00:00 125 ug via ORAL
  Filled 2017-09-16: qty 1

## 2017-09-16 MED ORDER — DIPHENHYDRAMINE HCL 25 MG PO CAPS
25.0000 mg | ORAL_CAPSULE | Freq: Every evening | ORAL | Status: DC | PRN
  Administered 2017-09-16: 25 mg via ORAL
  Filled 2017-09-16: qty 1

## 2017-09-16 NOTE — Consult Note (Signed)
CARDIOLOGY CONSULT NOTE    Patient ID: Kayla Harrison; [id number]; Mar 04, 1946   Admit date: 09/15/2017 Date of Consult: 09/16/2017  Primary Care Provider: [name], MD Primary Electrophysiologist:  [name], MD   Patient Profile:   Kayla Harrison is a 71 y.o. female with a hx of atrial flutter, status post successful DCCV on 07/18/2017, prior atrial fibrillation x 3 2008 and 2014 at [facility] and one prior at [place], on Eliquis, B-Cell lymphoma, with large superior mediastinal mass, contiguous with the right hilar region, encasing the trachea and right mainstem bronchus, undergoing chemotherapy on the right with  CHOP, chronic dizziness, significant side effects from chemo, to include increased A. fib burden, history of CVA without residual weakness, anemia, who is being seen today for the evaluation of atrial fibrillation at the request of Dr. Lonny Prude, Hospitalist service   History of Present Illness:   [name] presented to the emergency room with complaints of palpitations, shortness of breath, and had not been taking any cardiac medications due to undergoing chemotherapy.  Her last chemotherapy was in October, and she is currently in remission.  She had been taken off of amiodarone in October due to anemia and Dr. Caryl Comes felt this was contributing to it.  She has not been on any AV nodal blocking agents since that time.  The patient was undergoing physical therapy, and over the last 2 days was noted to have an elevated heart rate and worsening breathing status.  The patient contacted our office to notify that she felt she was back in atrial flutter and symptomatic.  She was advised to come to the emergency room.  On arrival to the emergency room the patient's blood pressure was 136/104, heart rate 140, O2 sat 97%, she was afebrile.  EKG revealed atrial fibrillation with RVR heart rate of 136 bpm.  Troponin 0 0.05, with follow-up troponin flat,  highest at 0.06,  Pertinent labs reveal hemoglobin of 10.2 hematocrit of 32.6, no leukocytosis, no thrombocytopenia.  (Hemoglobin has been stable since 08/30/2017 with prior hemoglobin of 10.8 hematocrit of 34.5).  Other labs include elevated glucose of 102, creatinine 0.93.  Urine was positive for proteinuria, negative for nitrates or ketones.  Lesion 1.9, BNP 250.2.  CT scan of the chest was negative for PE, moderate right pleural effusion was noted, along with mild left pleural effusion, also adjacent sub-segmental atelectasis, mediastinal adenopathy.  Chest x-ray revealed radiation fibrosis, there is no evidence of CHF or pneumonia.  She was started on diltiazem drip after 50 mg bolus, started on Rocephin 1 g in the ER, and admitted for heart rate control.  Repeat echocardiogram has been ordered.  She was continued on Eliquis for anticoagulation.   Past Medical History:  Diagnosis Date  . Abnormally small mouth    per pt "I have a small mouth"  . Anemia   . Arthritis    osteoarthritis - knees   . Atrial fibrillation [facility])    ablation- 2x's-- 1st time- [facility], 2nd event at [facility] in 2008. Convergent ablation at [facility] 6/14  . Blood transfusion without reported diagnosis   . Breast cancer ([place])    Dr Margot Chimes, total thyroidectomy- 1999- for cancer  . Colon polyp    Dr Earlean Shawl  . Complication of anesthesia    Ketamine produces LSD reaction, bright colored nightmarish experience   . Dyslipidemia   . Dysrhythmia    a fib with internal/external ablation, currently NSR  . H/O  pleural effusion    s/p thoracentesis w 3223m withdrawn  . Hepatitis    [place] ?hepatitis   . History of dysphagia    due to radiation therapy  . History of kidney stones   . Hx of thyroid cancer    Dr SForde Dandy . Hyperlipidemia   . Hypothyroidism   . Lung [place]   radiation RX completed 03/04/17; will start chemo 6/27  . Morbid obesity ([facility]     Status post lap band surgery  . Normal cardiac stress test    2008- cardiac stress/echo  . Pacemaker-[employer]tr[employer]   . [facility]   . Renal calculi   . Right shoulder pain    needs shoulder replacement  . Sinus node dysfunction ([place]    Complicating convergent ablation 6/14  . [place]    [place]  . Wears glasses    readers    Past Surgical History:  Procedure Laterality Date  . ABDOMINAL HYSTERECTOMY  1983  . afib ablation     X 2; [facility] & Dr KCaryl Comes . APPENDECTOMY    . BONE MARROW BIOPSY  02/21/2017  . BREAST LUMPECTOMY  2010  . bso  1998  . CARDIOVERSION  10/09/2012   Procedure: CARDIOVERSION;  Surgeon: JMinus Breeding MD;  [place]  Service: Cardiovascular;  Laterality: N/A;  . CARDIOVERSION  10/09/2012   Procedure: CARDIOVERSION;  Surgeon: JMinus Breeding MD;  [facility]  Service: Cardiovascular;  Laterality: N/A;  [name] the ok to add pt to the add on , but we must check to find out if the can add pt on at [phone] ( [phone])  . CARDIOVERSION N/A 11/20/2012   Procedure: CARDIOVERSION;  Surgeon: PFay Records MD;  [facility];  Service: Cardiovascular;  Laterality: N/A;  . CARDIOVERSION N/A 07/18/2017   Procedure: CARDIOVERSION;  Surgeon: NThayer Headings MD;  [place]  Service: Cardiovascular;  Laterality: N/A;  . CHOLECYSTECTOMY    . COLONOSCOPY W/ POLYPECTOMY     Dr MEarlean Shawl . convergent    . CYSTOSCOPY N/A 02/06/2015   Procedure: CYSTOSCOPY;  Surgeon: MKathie Rhodes MD;  [facility];  Service: Urology;  Laterality: N/A;  . CYSTOSCOPY WITH RETROGRADE PYELOGRAM, URETEROSCOPY AND STENT PLACEMENT Right 02/06/2015   Procedure: RETROGRADE PYELOGRAM, RIGHT URETEROSCOPY STENT PLACEMENT;  Surgeon: MKathie Rhodes MD;  [facility];  Service: Urology;  Laterality: Right;  . CYSTOSCOPY WITH RETROGRADE PYELOGRAM, URETEROSCOPY AND STENT PLACEMENT Right 03/07/2017   Procedure:  CYSTOSCOPY WITH RIGHT RETROGRADE PYELOGRAM,RIGHT  URETEROSCOPYLASER LITHOTRIPSY  AND STENT PLACEMENT AND STONE BASKETRY;  Surgeon: OKathie Rhodes MD;  [place]  Service: Urology;  Laterality: Right;  . EYE SURGERY     cataract surgery  . HOLMIUM LASER APPLICATION N/A 59/38/1017  Procedure: HOLMIUM LASER APPLICATION;  Surgeon: MKathie Rhodes MD;  [facility];  Service: Urology;  Laterality: N/A;  . HOLMIUM LASER APPLICATION Right 65/06/2584  Procedure: HOLMIUM LASER APPLICATION;  Surgeon: OKathie Rhodes MD;  [facility]  Service: Urology;  Laterality: Right;  . IR FLUORO GUIDE PORT INSERTION RIGHT  02/24/2017  . IR PATIENT EVAL TECH 0-60 MINS  03/11/2017  . IR [place] ACC[place] RIGHT  02/24/2017  . JOINT REPLACEMENT  04/13/12   Right Knee replacement  . KNEE ARTHROSCOPY  bilateral  . LAPAROSCOPIC GASTRIC BANDING  07/10/2010  . LAPAROTOMY     for ruptured ovary and ovarian artery   . PACEMAKER INSERTION  03/10/13   [facility]  . POCKET REVISION N/A 12/08/2013   Procedure: POCKET REVISION;  Surgeon: Deboraha Sprang, MD;  [facility];  Service: Cardiovascular;  Laterality: N/A;  . THYROIDECTOMY  1998   Dr Margot Chimes  . TONSILLECTOMY    . TOTAL KNEE ARTHROPLASTY  04/13/2012   Procedure: TOTAL KNEE ARTHROPLASTY;  Surgeon: Rudean Haskell, MD;  Location: [place];  Service: Orthopedics;  Laterality: Right;  . Transabdominal ablation  03/02/13   [facility]  . VIDEO BRONCHOSCOPY WITH ENDOBRONCHIAL ULTRASOUND N/A 02/07/2017   Procedure: VIDEO BRONCHOSCOPY WITH ENDOBRONCHIAL ULTRASOUND;  Surgeon: Marshell Garfinkel, MD;  Location: [place];  Service: Pulmonary;  Laterality: N/A;     Home Medications:  Prior to Admission medications   Medication Sig Start Date End Date Taking? Authorizing Provider  cycloSPORINE (RESTASIS) 0.05 % ophthalmic emulsion Place 1 drop into both eyes 2 (two) times daily.   Yes [provider]    diphenhydramine-acetaminophen (TYLENOL PM) 25-500 MG TABS tablet Take 1 tablet by mouth at bedtime as needed.   Yes [provider]  ELIQUIS 5 MG TABS tablet TAKE 1 TABLET BY MOUTH TWO  TIMES DAILY 08/26/17  Yes Deboraha Sprang, MD  levothyroxine (SYNTHROID, LEVOTHROID) 175 MCG tablet Take 175 mcg by mouth daily before breakfast.   Yes [provider]  lidocaine-prilocaine (EMLA) cream Apply to affected area once 02/25/17  Yes Nicholas Lose, MD  Lifitegrast Shirley Friar) 5 % SOLN Apply 1 drop to eye 2 (two) times daily.   Yes [provider]  Multiple Vitamin (MULTIVITAMIN) tablet Take 1 tablet by mouth daily.    Yes [provider]  acetaminophen (TYLENOL 8 HOUR) 650 MG CR tablet Take 1 tablet (650 mg total) by mouth every 8 (eight) hours as needed for pain. Patient not taking: Reported on 08/28/2017 03/16/17   Varney Biles, MD  allopurinol (ZYLOPRIM) 300 MG tablet Take 1 tablet (300 mg total) by mouth daily. Patient not taking: Reported on 08/28/2017 04/28/17   Nicholas Lose, MD  dicyclomine (BENTYL) 20 MG tablet Take 1 tablet (20 mg total) by mouth every 6 (six) hours. As needed Patient not taking: Reported on 08/28/2017 04/09/17   Nicholas Lose, MD  diphenoxylate-atropine (LOMOTIL) 2.5-0.025 MG tablet TAKE 2 TABLETS BY MOUTH TWICE DAILY AS NEEDED FOR DIARRHEA OR LOOSE STOOL Patient not taking: Reported on 08/28/2017 05/27/17   Nicholas Lose, MD  HYDROcodone-homatropine [facility]) 5-1.5 MG/5ML syrup Take 5 mLs by mouth every 6 (six) hours as needed for cough. Patient not taking: Reported on 08/28/2017 07/16/17   Nicholas Lose, MD  LORazepam (ATIVAN) 0.5 MG tablet Take 1 tablet (0.5 mg total) by mouth at bedtime. Patient not taking: Reported on 08/28/2017 02/25/17   Nicholas Lose, MD  ondansetron (ZOFRAN) 8 MG tablet Take 1 tablet (8 mg total) by mouth 2 (two) times daily as needed for refractory nausea / vomiting. Start on day 3 after cyclophosphamide chemotherapy. Patient not  taking: Reported on 08/28/2017 02/25/17   Nicholas Lose, MD  Oxycodone HCl 10 MG TABS Take 1 tablet (10 mg total) by mouth every 4 (four) hours as needed. Patient not taking: Reported on 08/28/2017 03/07/17   Kathie Rhodes, MD  sucralfate (CARAFATE) 1 g tablet Take 1 tablet (1 g total) by mouth 4 (four) times daily. Patient not taking: Reported on 08/28/2017 02/14/17  Kyung Rudd, MD    Inpatient Medications: Scheduled Meds: . apixaban  5 mg Oral BID  . cycloSPORINE  1 drop Both Eyes BID  . feeding supplement (ENSURE ENLIVE)  237 mL Oral BID BM  . levothyroxine  175 mcg Oral QAC breakfast  . multivitamin with minerals  1 tablet Oral Daily  . polyvinyl alcohol  1 drop Both Eyes BID   Continuous Infusions: . cefTRIAXone (ROCEPHIN)  IV    . diltiazem (CARDIZEM) infusion 15 mg/hr (09/16/17 0755)   PRN Meds: acetaminophen **OR** acetaminophen, bisacodyl, HYDROcodone-acetaminophen, ondansetron **OR** ondansetron (ZOFRAN) IV, senna-docusate  Allergies:    Allergies  Allergen Reactions  . Tikosyn [Dofetilide] Other (See Comments)    Prolonged QT interval Prolonged QT interval  . Xarelto [Rivaroxaban] Other (See Comments)    Nose Bleed X 6 hrs ; packing in ER  . Epinephrine Other (See Comments) and Rash    Dental form only (liquid). Patient stated she will become "out of it" she can hear you but cannot respond in normal fashion.  Marland Kitchen Ketamine     Hallucinations  . Oral Anesthetic [Benzocaine]   . Adhesive [Tape] Rash    Paper tape as well    Social History:   Social History   Socioeconomic History  . Marital status: Single    Spouse name: Not on file  . Number of children: 0  . Years of education: Not on file  . Highest education level: Not on file  Social Needs  . Financial resource strain: Not on file  . Food insecurity - worry: Not on file  . Food insecurity - inability: Not on file  . Transportation needs - medical: Not on file  . Transportation needs - non-medical: Not on  file  Occupational History  . 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 Smoking status: Never Smoker  . Smokeless tobacco: Never Used  Substance and Sexual Activity  . Alcohol use: No  . Drug use: No  . Sexual activity: No  Other Topics Concern  . Not on file  Social History Narrative   SINGLE    NO CHILDREN   NEVER SMOKED   EXERCISE 3 X WK          Family History:    Family History  Problem Relation Age of Onset  . Heart disease Father 48       MI '@autopsy'$   . Colon cancer Father        COLON  . Heart attack Father   . Diabetes Sister   . Diabetes Brother   . Diabetes Paternal Aunt   . Diabetes Paternal Grandmother      ROS:  Please see the history of present illness.  ROS  All other ROS reviewed and negative.     Physical Exam/Data:   Vitals:   09/15/17 2344 09/16/17 0411 09/16/17 0415 09/16/17 0739  BP: 102/68  130/67 118/75  Pulse: (!) 124  (!) 124 (!) 122  Resp: (!) 21  12   Temp: 99.4 F (37.4 C)  98.1 F (36.7 C) 97.6 F (36.4 C)  TempSrc: Oral  Oral Oral  SpO2: 93%  92% 96%  Weight:  209 lb 1.6 oz (94.8 kg)    Height:        Intake/Output Summary (Last 24 hours) at 09/16/2017 0810 Last data filed at 09/16/2017 0755 Gross per 24 hour  Intake 1120.17 ml  Output 1125 ml  Net -4.83 ml  Filed Weights   09/15/17 0910 09/16/17 0411  Weight: 213 lb (96.6 kg) 209 lb 1.6 oz (94.8 kg)   Body mass index is 33.75 kg/m.  General:  Well nourished, well developed, in no acute distress, pale.  HEENT: normal Lymph: no adenopathy Neck: no JVD Endocrine:  No thryomegaly Vascular: No carotid bruits; FA pulses 2+ bilaterally without bruits  Cardiac:  normal S1, S2; RRR; tachycardic no murmur  Lungs:  Clear to auscultation bilaterally, no wheezing, rhonchi or rales  Abd: soft, nontender, no hepatomegaly  Ext: no edema Musculoskeletal:  No deformities, BUE and BLE strength normal and equal. Painful left  ankle. Has supportive bandage placed.  Skin: warm and dry  Neuro:  CNs 2-12 intact, no focal abnormalities noted Psych:  Normal affect   EKG:  The EKG was personally reviewed and demonstrates: Atrial fibrillation with RVR heart rate of 136 bpm.  Telemetry:  Telemetry was personally reviewed and demonstrates:  Atrial flutter, rate of 126 bpm.   Relevant CV Studies: Echocardiogram 06/11/2017 Left ventricle: The cavity size was normal. Wall thickness was   normal. Systolic function was normal. The estimated ejection   fraction was in the range of 60% to 65%. The study is not   technically sufficient to allow evaluation of LV diastolic   function. - Aortic valve: There was trivial regurgitation. - Mitral valve: Severely calcified annulus. Moderately thickened,   moderately calcified leaflets . There was mild regurgitation. - Left atrium: The atrium was moderately dilated. - Atrial septum: A patent foramen ovale cannot be excluded. - Pulmonary arteries: PA peak pressure: 45 mm Hg (S).  Laboratory Data:  Chemistry Recent Labs  Lab 09/15/17 0928 09/16/17 0420  NA 140 137  K 4.1 3.7  CL 109 106  CO2 24 24  GLUCOSE 102* 101*  BUN 11 10  CREATININE 0.93 0.98  CALCIUM 8.8* 8.8*  GFRNONAA 60* 56*  GFRAA >60 >60  ANIONGAP 7 7    Recent Labs  Lab 09/15/17 0928 09/16/17 0420  PROT 5.5* 5.4*  ALBUMIN 3.0* 3.0*  AST 16 17  ALT 15 14  ALKPHOS 62 67  BILITOT 1.2 1.1   Hematology Recent Labs  Lab 09/15/17 0928 09/16/17 0420  WBC 4.2 5.3  RBC 3.27* 3.25*  HGB 10.2* 10.3*  HCT 32.6* 32.1*  MCV 99.7 98.8  MCH 31.2 31.7  MCHC 31.3 32.1  RDW 15.9* 15.8*  PLT 156 158   Cardiac Enzymes Recent Labs  Lab 09/15/17 0928 09/15/17 1622 09/15/17 2050 09/15/17 2335  TROPONINI 0.05* 0.05* 0.06* 0.06*   No results for input(s): TROPIPOC in the last 168 hours.  BNP Recent Labs  Lab 09/15/17 0828  BNP 250.2*    Radiology/Studies:  Ct Angio Chest Pe W And/or Wo  Contrast  Result Date: 09/15/2017 CLINICAL DATA:  Difficulty breathing. EXAM: CT ANGIOGRAPHY CHEST WITH CONTRAST TECHNIQUE: Multidetector CT imaging of the chest was performed using the standard protocol during bolus administration of intravenous contrast. Multiplanar CT image reconstructions and MIPs were obtained to evaluate the vascular anatomy. CONTRAST:  151m ISOVUE-370 IOPAMIDOL (ISOVUE-370) INJECTION 76% COMPARISON:  CT scan of May 27, 2017. PET scan of August 27, 2017. FINDINGS: Cardiovascular: Satisfactory opacification of the pulmonary arteries to the segmental level. No evidence of pulmonary embolism. No pericardial effusion. Mediastinum/Nodes: Stable right peritracheal in subcarinal adenopathy is noted consistent with history of lymphoma. Esophagus is unremarkable. Status post thyroidectomy. Lungs/Pleura: No pneumothorax is noted. Bilateral pleural effusions are noted with right greater than left, with  adjacent subsegmental atelectasis. Stable probable radiation fibrosis is noted medially in right upper lobe. Upper Abdomen: Status post lap band procedure. Musculoskeletal: No chest wall abnormality. No acute or significant osseous findings. Review of the MIP images confirms the above findings. IMPRESSION: No definite evidence of pulmonary embolus. Moderate right pleural effusion is noted as well as mild left pleural effusion, with adjacent subsegmental atelectasis. Probable radiation fibrosis is noted medially in right upper lobe. Stable mediastinal adenopathy compared to prior exam. Electronically Signed   By: Marijo Conception, M.D.   On: 09/15/2017 13:41   Dg Chest Portable 1 View  Result Date: 09/15/2017 CLINICAL DATA:  Shortness of Breath.  B-cell lymphoma EXAM: PORTABLE CHEST 1 VIEW COMPARISON:  PET-CT August 27, 2017; chest radiograph June 04, 2017 FINDINGS: There is scarring with presumed radiation fibrosis in the right medial thorax comfortably the right paratracheal region.  There is no edema or consolidation. There is a degree of volume loss on the right. Heart size and pulmonary vascularity are normal. No adenopathy is appreciable by radiography. Port-A-Cath tip is in the superior vena cava. Pacemaker leads are attached to the right atrium and right ventricle. There is a left atrial appendage clamp. There is postoperative change in the upper thoracic region in the midline. No bone lesions. IMPRESSION: Radiation fibrosis right peritracheal region. There is volume loss on the right. There is no edema or consolidation. Stable cardiac silhouette. No adenopathy demonstrable. No pneumothorax. Electronically Signed   By: Lowella Grip III M.D.   On: 09/15/2017 09:46    Assessment and Plan:   1. Paroxysmal Atrial flutter: Patient has noticed rapid heart rate for the last 2 days, is not been on any AV nodal blocking agents since October (was on amiodarone) of this year, has remained on Eliquis.DCCV in October 2018 which was successful.  She has been intolerant of Tikosyn.  She remains on diltiazem drip at 50 mg an hour.  The patient has been seen and examined by Dr. Golden Hurter, who recommends p.o. of Lopressor 25 mg p.o. twice daily.  Consultation with EP in a.m.   2.  B-cell lymphoma: Currently in remission, followed by oncology, last chemotherapy in October 2018.  3.  Deconditioning: Has been undergoing physical therapy, has injured her left ankle, felt to be sprained, supported with a strips.  4. OSA: On CPAP   For questions or updates, please contact [place] Please consult [email or web address] for contact info under Cardiology/STEMI.   Signed, Phill Myron. Jonay Hitchcock DNP, ANP, AACC  09/16/2017 8:10 AM

## 2017-09-16 NOTE — Progress Notes (Signed)
5 beats vtach. Pt asymptomatic, bp stable. On call cardiology notified via [place].

## 2017-09-16 NOTE — Progress Notes (Signed)
Pt noted to have a more regular rhythm, HR sustaining at 123. EKG confirms sinus tach. Cardizem still infusing at 15mg /hr. Will continue to monitor and do q6h EKGs as ordered.  Jacqlyn Larsen, RN

## 2017-09-16 NOTE — Progress Notes (Signed)
Patient having frequent ventricular ectopy with runs of V Tach. Most recent 10 beat run. She is symptomatic with lightheadedness. Will stop diltiazem and begin amiodarone gtt with bolus and continue lopressor. Dr. Radford Pax is notified and agrees.   Will check labs.

## 2017-09-16 NOTE — Progress Notes (Signed)
Around 1130, patient began having very frequent runs of VT (16 beats run was the longest).  BP 83/64 in chair. Patient felt lightheaded.  Assisted back to bed.  Cardizem drip decreased to 10mg /hr.  Blood pressure increased to 113/68 once supine..  Paged on-call PA.  EKG performed at bedside.  Zoll pads placed on patient.  PA at bedside shortly thereafter.  Ordered to discontinue Cardizem Drip.  Plan to initiate an Amiodarone drip once Pharmacy is consulted.

## 2017-09-16 NOTE — Progress Notes (Signed)
Patient stated that she has had a sleep study and doesn't require a CPAP at home.  Pt refused to wear a CPAP in the facility due to not needing to wear at home.

## 2017-09-16 NOTE — Progress Notes (Signed)
[facility] PROGRESS NOTE  Kayla Harrison DPO:[id number] DOB: 1946-06-27 DOA: 09/15/2017  PCP: [name], MD  Brief History/Interval Summary: 71 year old Caucasian female with a past medical history including atrial fibrillation status post ablation, anemia, hypertension, hyperlipidemia, history of breast and thyroid cancer, history of lung cancer and formal for which she recently completed chemotherapy and is in remission, history of pacemaker placement for sinus node dysfunction.  She presented with 3-day history of palpitations and shortness of breath.  Found to have atrial flutter.  Hospitalized for further management.  Reason for Visit: Atrial flutter  Consultants: Cardiology  Procedures: None yet  Antibiotics: None  Subjective/Interval History: Patient states that she continues to have palpitations.  Denies any chest pain per se.  No shortness of breath at rest.  Denies any nausea or vomiting.  ROS: No headaches  Objective:  Vital Signs  Vitals:   09/16/17 0415 09/16/17 0739 09/16/17 1125 09/16/17 1130  BP: 130/67 118/75 (!) 83/64 113/68  Pulse: (!) 124 (!) 122    Resp: 12     Temp: 98.1 F (36.7 C) 97.6 F (36.4 C)    TempSrc: Oral Oral    SpO2: 92% 96%    Weight:      Height:        Intake/Output Summary (Last 24 hours) at 09/16/2017 1146 Last data filed at 09/16/2017 1059 Gross per 24 hour  Intake 1360.17 ml  Output 1425 ml  Net -64.83 ml   Filed Weights   09/15/17 0910 09/16/17 0411  Weight: 96.6 kg (213 lb) 94.8 kg (209 lb 1.6 oz)    General appearance: alert, cooperative, appears stated age and no distress Head: Normocephalic, without obvious abnormality, atraumatic Resp: Initiated entry of the bases without any definite crackles.  No wheezing.  No rhonchi. Cardio: S1-S2 noted to be tachycardic regular.  No S3-S4. GI: soft, non-tender; bowel sounds normal; no masses,  no organomegaly Extremities: Mild edema bilateral lower  extremities Skin: Skin color, texture, turgor normal. No rashes or lesions Neurologic: Awake alert.  Oriented x3.  No focal neurological deficits.  Lab Results:  Data Reviewed: I have personally reviewed following labs and imaging studies  CBC: Recent Labs  Lab 09/15/17 0928 09/16/17 0420  WBC 4.2 5.3  NEUTROABS 3.0  --   HGB 10.2* 10.3*  HCT 32.6* 32.1*  MCV 99.7 98.8  PLT 156 431    Basic Metabolic Panel: Recent Labs  Lab 09/15/17 0928 09/16/17 0420  NA 140 137  K 4.1 3.7  CL 109 106  CO2 24 24  GLUCOSE 102* 101*  BUN 11 10  CREATININE 0.93 0.98  CALCIUM 8.8* 8.8*  MG 1.9  --     GFR: Estimated Creatinine Clearance: 59.3 mL/min (by C-G formula based on SCr of 0.98 mg/dL).  Liver Function Tests: Recent Labs  Lab 09/15/17 0928 09/16/17 0420  AST 16 17  ALT 15 14  ALKPHOS 62 67  BILITOT 1.2 1.1  PROT 5.5* 5.4*  ALBUMIN 3.0* 3.0*    Coagulation Profile: Recent Labs  Lab 09/16/17 0420  INR 1.46    Cardiac Enzymes: Recent Labs  Lab 09/15/17 0928 09/15/17 1622 09/15/17 2050 09/15/17 2335  TROPONINI 0.05* 0.05* 0.06* 0.06*   Thyroid Function Tests: Recent Labs    09/15/17 1622  TSH 0.722  FREET4 1.63*     Recent Results (from the past 240 hour(s))  Urine culture     Status: Abnormal   Collection Time: 09/15/17 12:50 PM  Result Value Ref  Range Status   Specimen Description URINE, CLEAN CATCH  Final   Special Requests NONE  Final   Culture <10,000 COLONIES/mL (A)  Final   Report Status 09/16/2017 FINAL  Final  MRSA PCR Screening     Status: None   Collection Time: 09/15/17  4:38 PM  Result Value Ref Range Status   MRSA by PCR NEGATIVE NEGATIVE Final    Comment:        The GeneXpert MRSA Assay (FDA approved for NASAL specimens only), is one component of a comprehensive MRSA colonization surveillance program. It is not intended to diagnose MRSA infection nor to guide or monitor treatment for MRSA infections.       Radiology  Studies: X-ray Chest Pa And Lateral  Result Date: 09/16/2017 CLINICAL DATA:  Encounter for congestive heart failure. EXAM: CHEST  2 VIEW COMPARISON:  09/15/2017. FINDINGS: The heart size and mediastinal contours are within normal limits. Dual lead pacer unchanged. Adenopathy with mediastinal fibrosis, stable. Unchanged Port-A-Cath. Pleural effusions better visualized on CT. No consolidation or frank edema. IMPRESSION: Stable chest. Electronically Signed   By: Staci Righter M.D.   On: 09/16/2017 09:11   Ct Angio Chest Pe W And/or Wo Contrast  Result Date: 09/15/2017 CLINICAL DATA:  Difficulty breathing. EXAM: CT ANGIOGRAPHY CHEST WITH CONTRAST TECHNIQUE: Multidetector CT imaging of the chest was performed using the standard protocol during bolus administration of intravenous contrast. Multiplanar CT image reconstructions and MIPs were obtained to evaluate the vascular anatomy. CONTRAST:  119mL ISOVUE-370 IOPAMIDOL (ISOVUE-370) INJECTION 76% COMPARISON:  CT scan of May 27, 2017. PET scan of August 27, 2017. FINDINGS: Cardiovascular: Satisfactory opacification of the pulmonary arteries to the segmental level. No evidence of pulmonary embolism. No pericardial effusion. Mediastinum/Nodes: Stable right peritracheal in subcarinal adenopathy is noted consistent with history of lymphoma. Esophagus is unremarkable. Status post thyroidectomy. Lungs/Pleura: No pneumothorax is noted. Bilateral pleural effusions are noted with right greater than left, with adjacent subsegmental atelectasis. Stable probable radiation fibrosis is noted medially in right upper lobe. Upper Abdomen: Status post lap band procedure. Musculoskeletal: No chest wall abnormality. No acute or significant osseous findings. Review of the MIP images confirms the above findings. IMPRESSION: No definite evidence of pulmonary embolus. Moderate right pleural effusion is noted as well as mild left pleural effusion, with adjacent subsegmental  atelectasis. Probable radiation fibrosis is noted medially in right upper lobe. Stable mediastinal adenopathy compared to prior exam. Electronically Signed   By: Marijo Conception, M.D.   On: 09/15/2017 13:41   Dg Chest Portable 1 View  Result Date: 09/15/2017 CLINICAL DATA:  Shortness of Breath.  B-cell lymphoma EXAM: PORTABLE CHEST 1 VIEW COMPARISON:  PET-CT August 27, 2017; chest radiograph June 04, 2017 FINDINGS: There is scarring with presumed radiation fibrosis in the right medial thorax comfortably the right paratracheal region. There is no edema or consolidation. There is a degree of volume loss on the right. Heart size and pulmonary vascularity are normal. No adenopathy is appreciable by radiography. Port-A-Cath tip is in the superior vena cava. Pacemaker leads are attached to the right atrium and right ventricle. There is a left atrial appendage clamp. There is postoperative change in the upper thoracic region in the midline. No bone lesions. IMPRESSION: Radiation fibrosis right peritracheal region. There is volume loss on the right. There is no edema or consolidation. Stable cardiac silhouette. No adenopathy demonstrable. No pneumothorax. Electronically Signed   By: Lowella Grip III M.D.   On: 09/15/2017 09:46  Medications:  Scheduled: . apixaban  5 mg Oral BID  . cycloSPORINE  1 drop Both Eyes BID  . feeding supplement (ENSURE ENLIVE)  237 mL Oral BID BM  . [START ON 09/17/2017] levothyroxine  125 mcg Oral QAC breakfast  . metoprolol tartrate  25 mg Oral BID  . multivitamin with minerals  1 tablet Oral Daily  . polyvinyl alcohol  1 drop Both Eyes BID   Continuous: . cefTRIAXone (ROCEPHIN)  IV     ZOX:[id number] **OR** acetaminophen, bisacodyl, HYDROcodone-acetaminophen, ondansetron **OR** ondansetron (ZOFRAN) IV, senna-docusate  Assessment/Plan:  Active Problems:   HYPERLIPIDEMIA   Morbid obesity (HCC)   Atrial fibrillation (HCC)   Chronic diastolic heart  failure (HCC)   BREAST CANCER, HX OF   CARCINOMA, THYROID GLAND, HX OF   COLONIC POLYPS, HX OF   Long term (current) use of anticoagulants   Pleural effusion   Hypothyroidism   OSA (obstructive sleep apnea)   Non-Hodgkin lymphoma, unspecified, intrathoracic lymph nodes ([place])   Port catheter in place   Atrial flutter (HCC)   Atrial fibrillation with RVR (HCC)    Atrial flutter with RVR Patient with known history of atrial fibrillation.  She is on anticoagulation with Eliquis.  Was noted to be in atrial flutter in October.  Cardioversion was done on 10/26 for atrial flutter.  Patient appears to be back in the same rhythm.  Currently on diltiazem infusion.  Seen by cardiology this morning and beta-blocker has been added.  Electrophysiology to see the patient tomorrow.  Subsequently patient noted to have ventricular ectopy.  Amiodarone has been added by cardiology.  Dyspnea/Bilateral pleural effusions Likely multifactorial including atrial flutter.  No PE noted on CT scan. Patient also noted to have bilateral pleural effusion right greater than left.  This could also be contributing to her symptoms.  It appears the patient was referred to pulmonology for her symptoms and abnormalities noted on a previous CT scan.  Does not look like she has had that consultation yet.  Pleural effusions are noted more so on the CT scan than on plain films.  They appear to have increased in size compared to previous imaging studies.  Etiology seems unclear.  Since it is bilateral could be due to a cardiac etiology.  Echocardiogram done in September showed normal systolic function.  Another echocardiogram has been ordered and is pending.  Quite possible she may have developed fluid overload due to atrial flutter.  However no interstitial edema is appreciated.  BNP was 250.  Continue to monitor for now.  Wait for cardiac issues to stabilize.  Chronic diastolic congestive heart failure See discussion above as well.  No  noted to be on any diuretics at home.  Will wait on repeat echocardiogram.  May need to consider diuretics here in the hospital.  Normocytic anemia Most likely secondary to anemia of chronic disease.  Continue to monitor hemoglobin.  No evidence for overt bleeding.  Non-Hodgkin's lymphoma in remission Patient followed by oncology.  She completed chemotherapy recently.  History of essential hypertension Monitor blood pressures closely.  Urinary tract infection UA was noted to be abnormal.  She was started on ceftriaxone.  Urine cultures without any growth.  History of hypothyroidism/thyroid cancer Patient noted to be on levothyroxine.  Her free T4 is noted to be elevated.  TSH is normal.  In view of her atrial flutter we will go ahead and reduce the dose of her Synthroid.  History of obstructive sleep apnea CPAP while sleeping.  History of nephrolithiasis with right-sided hydronephrosis Followed by urology, Dr. Karsten Ro.  She had to undergo ureteral stent placement in June 2018.  Creatinine is normal.  Monitor urine output.  Outpatient follow-up with urology.  DVT Prophylaxis: On Eliquis    Code Status: Full code Family Communication: Discussed with the patient Disposition Plan: Management as outlined above.    LOS: 0 days   [place] [facility] Pager [phone] 09/16/2017, 11:46 AM  If 7PM-7AM, please contact night-coverage at [email or web address], password [facility]

## 2017-09-17 ENCOUNTER — Inpatient Hospital Stay (HOSPITAL_COMMUNITY): Payer: Medicare Other

## 2017-09-17 ENCOUNTER — Telehealth: Payer: Self-pay | Admitting: Pulmonary Disease

## 2017-09-17 ENCOUNTER — Encounter (HOSPITAL_COMMUNITY): Payer: Self-pay | Admitting: Nurse Practitioner

## 2017-09-17 DIAGNOSIS — I483 Typical atrial flutter: Secondary | ICD-10-CM

## 2017-09-17 DIAGNOSIS — I340 Nonrheumatic mitral (valve) insufficiency: Secondary | ICD-10-CM

## 2017-09-17 LAB — CBC
HCT: 31.4 % — ABNORMAL LOW (ref 36.0–46.0)
HEMOGLOBIN: 10 g/dL — AB (ref 12.0–15.0)
MCH: 31.4 pg (ref 26.0–34.0)
MCHC: 31.8 g/dL (ref 30.0–36.0)
MCV: 98.7 fL (ref 78.0–100.0)
PLATELETS: 152 10*3/uL (ref 150–400)
RBC: 3.18 MIL/uL — AB (ref 3.87–5.11)
RDW: 15.6 % — ABNORMAL HIGH (ref 11.5–15.5)
WBC: 4.8 10*3/uL (ref 4.0–10.5)

## 2017-09-17 LAB — BASIC METABOLIC PANEL
ANION GAP: 9 (ref 5–15)
BUN: 13 mg/dL (ref 6–20)
CHLORIDE: 104 mmol/L (ref 101–111)
CO2: 23 mmol/L (ref 22–32)
Calcium: 8.8 mg/dL — ABNORMAL LOW (ref 8.9–10.3)
Creatinine, Ser: 1.05 mg/dL — ABNORMAL HIGH (ref 0.44–1.00)
GFR calc Af Amer: 60 mL/min — ABNORMAL LOW (ref >60)
GFR, EST NON AFRICAN AMERICAN: 51 mL/min — AB (ref >60)
Glucose, Bld: 139 mg/dL — ABNORMAL HIGH (ref 65–99)
POTASSIUM: 3.6 mmol/L (ref 3.5–5.1)
SODIUM: 136 mmol/L (ref 135–145)

## 2017-09-17 LAB — ECHOCARDIOGRAM COMPLETE
HEIGHTINCHES: 66 in
Weight: 3393.6 oz

## 2017-09-17 MED ORDER — FUROSEMIDE 20 MG PO TABS: ORAL_TABLET | ORAL | 0 refills | Status: DC

## 2017-09-17 MED ORDER — HEPARIN SOD (PORK) LOCK FLUSH 100 UNIT/ML IV SOLN
500.0000 [IU] | INTRAVENOUS | Status: AC | PRN
  Administered 2017-09-17: 500 [IU]

## 2017-09-17 MED ORDER — AMIODARONE HCL 200 MG PO TABS: 400.0000 mg | ORAL_TABLET | Freq: Two times a day (BID) | ORAL | 0 refills | Status: DC

## 2017-09-17 MED ORDER — METOPROLOL TARTRATE 25 MG PO TABS: 25.0000 mg | ORAL_TABLET | Freq: Two times a day (BID) | ORAL | 0 refills | Status: DC

## 2017-09-17 MED ORDER — AMIODARONE HCL 200 MG PO TABS
400.0000 mg | ORAL_TABLET | Freq: Two times a day (BID) | ORAL | Status: DC
  Administered 2017-09-17: 400 mg via ORAL
  Filled 2017-09-17: qty 2

## 2017-09-17 MED ORDER — LEVOTHYROXINE SODIUM 137 MCG PO TABS: 137.0000 ug | ORAL_TABLET | Freq: Every day | ORAL | 0 refills | Status: DC

## 2017-09-17 NOTE — Progress Notes (Signed)
  Echocardiogram 2D Echocardiogram has been performed.  Kayla Harrison 09/17/2017, 9:48 AM

## 2017-09-17 NOTE — Plan of Care (Signed)
  Clinical Measurements: Ability to maintain clinical measurements within normal limits will improve. No complaints of cp or dizziness. VS stable with no episodes of hypotension this shift.. Will continue to monitor. 09/17/2017 0330 - Progressing by Mellissa Kohut, RN

## 2017-09-17 NOTE — Discharge Instructions (Signed)
Atrial Flutter °Atrial flutter is a type of abnormal heart rhythm (arrhythmia). In atrial flutter, the heartbeat is fast but regular. There are two types of atrial flutter: °· Paroxysmal atrial flutter. This type starts suddenly. It usually stops on its own soon after it starts. °· Permanent atrial flutter. This type does not go away. ° °What are the causes? °This condition may be caused by: °· A heart condition or problem, such as: °? A heart attack. °? Heart failure. °? A heart valve problem. °· A lung problem, such as: °? A blood clot in the lungs (pulmonary embolism, or PE). °? Chronic obstructive pulmonary disease. °· Poorly controlled high blood pressure (hypertension). °· Hyperthyroidism. °· Caffeine. °· Some decongestant cold medicines. °· Low levels of minerals called electrolytes in the blood. °· Cocaine. ° °What increases the risk? °This condition is more likely to develop in: °· Elderly adults. °· Men. ° °What are the signs or symptoms? °Symptoms of this condition include: °· A feeling that your heart is pounding or racing (palpitations). °· Shortness of breath. °· Chest pain. °· Feeling light-headed. °· Dizziness. °· Fainting. ° °How is this diagnosed? °This condition may be diagnosed with tests, including: °· An electrocardiogram (ECG). This is a painless test that records electrical signals in the heart. °· Holter monitoring. For this test, you wear a device that records your heartbeat for 1-2 days. °· Cardiac event monitoring. For this test, you wear a device that records your heartbeat for up to 30 days. °· An echocardiogram. This is a painless test that uses sound waves to make a picture of your heart. °· Stress test. This test records your heartbeat while you exercise. °· Blood tests. ° °How is this treated? °This condition may be treated with: °· Treatment of any underlying conditions. °· Medicine to make your heart beat more slowly. °· Medicine to keep the condition from coming back. °· A  procedure to keep the condition under control. Some procedures to do this include: °? Cardioversion. During this procedure, medicines or an electrical shock are given to make the heart beat normally. °? Ablation. During this procedure, the heart tissue that is causing the problem is destroyed. This procedure may be done if atrial flutter lasts a long time or happens often. ° °Follow these instructions at home: °· Take over-the-counter and prescription medicines only as told by your health care provider. °· Do not take any new medicines without talking to your health care provider. °· Do not use tobacco products, including cigarettes, chewing tobacco, or e-cigarettes. If you need help quitting, ask your health care provider. °· Limit alcohol intake to no more than 1 drink per day for nonpregnant women and 2 drinks per day for men. One drink equals 12 oz of beer, 5 oz of wine, or 1½ oz of hard liquor. °· Try to reduce any stress. Stress can make your symptoms worse. °Contact a health care provider if: °· Your symptoms get worse. °Get help right away if: °· You are dizzy. °· You feel like fainting or you faint. °· You have shortness of breath. °· You feel pain or pressure in your chest. °· You suddenly feel nauseous or you suddenly vomit. °· There is a sudden change in your ability to speak, eat, or move. °· You are sweating a lot for no reason. °This information is not intended to replace advice given to you by your health care provider. Make sure you discuss any questions you have with your health care   provider. Document Released: 01/26/2009 Document Revised: 01/17/2016 Document Reviewed: 03/24/2015 Elsevier Interactive Patient Education  2018 [place] on my medicine - ELIQUIS (apixaban)  Why was Eliquis prescribed for you? Eliquis was prescribed for you to reduce the risk of forming blood clots that can cause a stroke if you have a medical condition called atrial fibrillation (a type  of irregular heartbeat) OR to reduce the risk of a blood clots forming after orthopedic surgery.  What do You need to know about Eliquis ? Take your Eliquis TWICE DAILY - one tablet in the morning and one tablet in the evening with or without food.  It would be best to take the doses about the same time each day.  If you have difficulty swallowing the tablet whole please discuss with your pharmacist how to take the medication safely.  Take Eliquis exactly as prescribed by your doctor and DO NOT stop taking Eliquis without talking to the doctor who prescribed the medication.  Stopping may increase your risk of developing a new clot or stroke.  Refill your prescription before you run out.  After discharge, you should have regular check-up appointments with your healthcare provider that is prescribing your Eliquis.  In the future your dose may need to be changed if your kidney function or weight changes by a significant amount or as you get older.  What do you do if you miss a dose? If you miss a dose, take it as soon as you remember on the same day and resume taking twice daily.  Do not take more than one dose of ELIQUIS at the same time.  Important Safety Information A possible side effect of Eliquis is bleeding. You should call your healthcare provider right away if you experience any of the following: ? Bleeding from an injury or your nose that does not stop. ? Unusual colored urine (red or dark brown) or unusual colored stools (red or black). ? Unusual bruising for unknown reasons. ? A serious fall or if you hit your head (even if there is no bleeding).  Some medicines may interact with Eliquis and might increase your risk of bleeding or clotting while on Eliquis. To help avoid this, consult your healthcare provider or pharmacist prior to using any new prescription or non-prescription medications, including herbals, vitamins, non-steroidal anti-inflammatory drugs (NSAIDs) and  supplements.  This website has more information on Eliquis (apixaban): [email or web address].no.

## 2017-09-17 NOTE — Telephone Encounter (Signed)
Rec'd staff message from [facility] this morning requesting patient to be called for hospital follow up.  Called and spoke with pt regarding a follow up appt per [facility]. She scheduled today for Monday Oct 06 2017 at 9am.

## 2017-09-17 NOTE — Consult Note (Signed)
ELECTROPHYSIOLOGY CONSULT NOTE    Patient ID: Kayla Harrison MRN: [id number], DOB/AGE: 05-12-1946 71 y.o.  Admit date: 09/15/2017 Date of Consult: 09/17/2017  Primary Physician: [name], MD Electrophysiologist: Caryl Comes  Patient Profile: Kayla Harrison is a 71 y.o. female with a history of persistent atrial arrhythmias, B cell lymphoma s/p chemo, and prior CVA who is being seen today for the evaluation of AF with RVR and WCT at the request of Dr Radford Pax.  HPI:  Kayla Harrison is a 71 y.o. female with the above past medical history. She has undergone PVI at [facility] in 2008 as well as convergent AF ablation in 4782 complicated by complete heart block and is s/p MDT pacemaker. In May of this year, she was found to have lymphoma and has undergone chemo and is now in remission. She was admitted for tachycardia and found to be in recurrent AF with RVR.  Her amiodarone was discontinued in October 2/2 concerns for shortness of breath.  She was also found to be anemic and given a transfusion which helped her shortness of breath significantly.  She had some WCT yesterday on telemetry and was started on IV amiodarone.  EP has been asked to evaluate for treatment options.  She currently feels "ok" and wants to go home. She does not want to stay in the hospital any longer.  She has had some lightheaded spells but no pre-syncope or syncope.  EF was normal by echo in September of this year.   Device interrogation demonstrates atrial flutter with A blanking resulting in intermittent mode switching and V pacing at MTR.  No VT recorded by device.  Device reprogrammed today to DDIR to prevent inappropriate pacing at MTR.  She denies chest pain, PND, orthopnea, nausea, vomiting, syncope, edema, weight gain, or early satiety.  Past Medical History:  Diagnosis Date  . Anemia   . Arthritis    osteoarthritis - knees   . Atrial fibrillation [facility])    ablation- 2x's-- 1st time- [facility], 2nd  event at [facility] in 2008. Convergent ablation at [facility] 6/14  . Blood transfusion without reported diagnosis   . Breast cancer ([place])    Dr Margot Chimes, total thyroidectomy- 1999- for cancer  . Colon polyp    Dr Earlean Shawl  . Complication of anesthesia    Ketamine produces LSD reaction, bright colored nightmarish experience   . Dyslipidemia   . H/O pleural effusion    s/p thoracentesis w 329m withdrawn  . Hepatitis    [place]hepatitis   . History of dysphagia    due to radiation therapy  . History of kidney stones   . Hx of thyroid cancer    Dr SForde Dandy . Hyperlipidemia   . Hypothyroidism   . Lung cancer, lower lobe ([place] 01[place]   radiation RX completed 03/04/17; Kanda Deluna start chemo 6/27  . Morbid [place]    Status post lap band surgery  . Persistent atrial fibrillation ([place]    a. s/p PVI 2008 b. s/p convergent ablation 29562complicated by bradycardia requiring pacemaker implant  . Sinus node dysfunction [place]    Complicating convergent ablation 6/14  . [place]    [id number]     Surgical History:  Past Surgical History:  Procedure Laterality Date  . ABDOMINAL HYSTERECTOMY  1983  . afib ablation     a. 2008 PVI b. 2014 convergent ablation  . APPENDECTOMY    . BONE MARROW  BIOPSY  02/21/2017  . BREAST LUMPECTOMY  2010  . bso  1998  . CARDIOVERSION  10/09/2012   Procedure: CARDIOVERSION;  Surgeon: Minus Breeding, MD;  Location: [place];  Service: Cardiovascular;  Laterality: N/A;  . CARDIOVERSION  10/09/2012   Procedure: CARDIOVERSION;  Surgeon: Minus Breeding, MD;  Location: [facility] ENDOSCOPY;  Service: Cardiovascular;  Laterality: N/A;  [name] gave the ok to add pt to the add on , but we must check to find out if the can add pt on at [phone] ( [phone])  . CARDIOVERSION N/A 11/20/2012   Procedure: CARDIOVERSION;  Surgeon: Fay Records, MD;  Location: [facility] ENDOSCOPY;  Service: Cardiovascular;  Laterality: N/A;  . CARDIOVERSION N/A 07/18/2017    Procedure: CARDIOVERSION;  Surgeon: Thayer Headings, MD;  Location: [place];  Service: Cardiovascular;  Laterality: N/A;  . CHOLECYSTECTOMY    . COLONOSCOPY W/ POLYPECTOMY     Dr Earlean Shawl  . CYSTOSCOPY N/A 02/06/2015   Procedure: CYSTOSCOPY;  Surgeon: Kathie Rhodes, MD;  [facility];  Service: Urology;  Laterality: N/A;  . CYSTOSCOPY WITH RETROGRADE PYELOGRAM, URETEROSCOPY AND STENT PLACEMENT Right 02/06/2015   Procedure: RETROGRADE PYELOGRAM, RIGHT URETEROSCOPY STENT PLACEMENT;  Surgeon: Kathie Rhodes, MD;  Location: [facility];  Service: Urology;  Laterality: Right;  . CYSTOSCOPY WITH RETROGRADE PYELOGRAM, URETEROSCOPY AND STENT PLACEMENT Right 03/07/2017   Procedure: CYSTOSCOPY WITH RIGHT RETROGRADE PYELOGRAM,RIGHT  URETEROSCOPYLASER LITHOTRIPSY  AND STENT PLACEMENT AND STONE BASKETRY;  Surgeon: Kathie Rhodes, MD;  Location: [place];  Service: Urology;  Laterality: Right;  . EYE SURGERY     cataract surgery  . HOLMIUM LASER APPLICATION N/A 5/46/5681   Procedure: HOLMIUM LASER APPLICATION;  Surgeon: Kathie Rhodes, MD;  Location: [facility];  Service: Urology;  Laterality: N/A;  . HOLMIUM LASER APPLICATION Right 2/75/1700   Procedure: HOLMIUM LASER APPLICATION;  Surgeon: Kathie Rhodes, MD;  Location[facility];  Service: Urology;  Laterality: Right;  . IR FLUORO GUIDE PORT INSERTION RIGHT  02/24/2017  . IR PATIENT EVAL TECH 0-60 MINS  03/11/2017  . IR US GUIDE VASC ACCESS RIGHT  02/24/2017  . KNEE ARTHROSCOPY     bilateral  . LAPAROSCOPIC GASTRIC BANDING  07/10/2010  . LAPAROTOMY     for ruptured ovary and ovarian artery   . PACEMAKER INSERTION  03/10/2013   MDT dual chamber PPM  . POCKET REVISION N/A 12/08/2013   Procedure: POCKET REVISION;  Surgeon: Deboraha Sprang, MD;  Location: [facility] CATH LAB;  Service: Cardiovascular;  Laterality: N/A;  . THYROIDECTOMY  1998   Dr Margot Chimes  . TONSILLECTOMY    . TOTAL KNEE ARTHROPLASTY  04/13/2012   Procedure: TOTAL KNEE  ARTHROPLASTY;  Surgeon: Rudean Haskell, MD;  Location: [place];  Service: Orthopedics;  Laterality: Right;  Marland Kitchen VIDEO BRONCHOSCOPY WITH ENDOBRONCHIAL ULTRASOUND N/A 02/07/2017   Procedure: VIDEO BRONCHOSCOPY WITH ENDOBRONCHIAL ULTRASOUND;  Surgeon: Marshell Garfinkel, MD;  Location: [place];  Service: Pulmonary;  Laterality: N/A;     Medications Prior to Admission  Medication Sig Dispense Refill Last Dose  . cycloSPORINE (RESTASIS) 0.05 % ophthalmic emulsion Place 1 drop into both eyes 2 (two) times daily.   09/14/2017 at Unknown time  . diphenhydramine-acetaminophen (TYLENOL PM) 25-500 MG TABS tablet Take 1 tablet by mouth at bedtime as needed.   09/14/2017 at Unknown time  . ELIQUIS 5 MG TABS tablet TAKE 1 TABLET BY MOUTH TWO  TIMES DAILY 180 tablet 1 09/14/2017 at 2000  . levothyroxine (SYNTHROID, LEVOTHROID)  175 MCG tablet Take 175 mcg by mouth daily before breakfast.   09/14/2017 at Unknown time  . lidocaine-prilocaine (EMLA) cream Apply to affected area once 30 g 3 0ctober for chemo use  . Lifitegrast (XIIDRA) 5 % SOLN Apply 1 drop to eye 2 (two) times daily.   09/14/2017 at Unknown time  . Multiple Vitamin (MULTIVITAMIN) tablet Take 1 tablet by mouth daily.    Past Week at Unknown time  . acetaminophen (TYLENOL 8 HOUR) 650 MG CR tablet Take 1 tablet (650 mg total) by mouth every 8 (eight) hours as needed for pain. (Patient not taking: Reported on 08/28/2017) 30 tablet 0 Not Taking at Unknown time  . allopurinol (ZYLOPRIM) 300 MG tablet Take 1 tablet (300 mg total) by mouth daily. (Patient not taking: Reported on 08/28/2017) 30 tablet 3 Completed Course at Unknown time  . dicyclomine (BENTYL) 20 MG tablet Take 1 tablet (20 mg total) by mouth every 6 (six) hours. As needed (Patient not taking: Reported on 08/28/2017) 60 tablet 1 Completed Course at Unknown time  . diphenoxylate-atropine (LOMOTIL) 2.5-0.025 MG tablet TAKE 2 TABLETS BY MOUTH TWICE DAILY AS NEEDED FOR DIARRHEA OR LOOSE STOOL (Patient not  taking: Reported on 08/28/2017) 30 tablet 0 Completed Course at Unknown time  . HYDROcodone-homatropine (HYCODAN) 5-1.5 MG/5ML syrup Take 5 mLs by mouth every 6 (six) hours as needed for cough. (Patient not taking: Reported on 08/28/2017) 120 mL 0 Not Taking at Unknown time  . LORazepam (ATIVAN) 0.5 MG tablet Take 1 tablet (0.5 mg total) by mouth at bedtime. (Patient not taking: Reported on 08/28/2017) 30 tablet 0 Not Taking at Unknown time  . ondansetron (ZOFRAN) 8 MG tablet Take 1 tablet (8 mg total) by mouth 2 (two) times daily as needed for refractory nausea / vomiting. Start on day 3 after cyclophosphamide chemotherapy. (Patient not taking: Reported on 08/28/2017) 30 tablet 1 Not Taking at Unknown time  . Oxycodone HCl 10 MG TABS Take 1 tablet (10 mg total) by mouth every 4 (four) hours as needed. (Patient not taking: Reported on 08/28/2017) 20 tablet 0 Not Taking at Unknown time  . sucralfate (CARAFATE) 1 g tablet Take 1 tablet (1 g total) by mouth 4 (four) times daily. (Patient not taking: Reported on 08/28/2017) 120 tablet 2 Not Taking at Unknown time    Inpatient Medications:  . amiodarone  400 mg Oral BID  . apixaban  5 mg Oral BID  . cycloSPORINE  1 drop Both Eyes BID  . feeding supplement (ENSURE ENLIVE)  237 mL Oral BID BM  . levothyroxine  125 mcg Oral QAC breakfast  . metoprolol tartrate  25 mg Oral BID  . multivitamin with minerals  1 tablet Oral Daily  . polyvinyl alcohol  1 drop Both Eyes BID    Allergies:  Allergies  Allergen Reactions  . Tikosyn [Dofetilide] Other (See Comments)    Prolonged QT interval Prolonged QT interval  . Xarelto [Rivaroxaban] Other (See Comments)    Nose Bleed X 6 hrs ; packing in ER  . Epinephrine Other (See Comments) and Rash    Dental form only (liquid). Patient stated she Nayleah Gamel become "out of it" she can hear you but cannot respond in normal fashion.  Marland Kitchen Ketamine     Hallucinations  . Oral Anesthetic [Benzocaine]   . Adhesive [Tape] Rash     Paper tape as well    Social History   Socioeconomic History  . Marital status: Single    Spouse name:  Not on file  . Number of children: 0  . Years of education: Not on file  . Highest education level: Not on file  Social Needs  . Financial resource strain: Not on file  . Food insecurity - worry: Not on file  . Food insecurity - inability: Not on file  . Transportation needs - medical: Not on file  . Transportation needs - non-medical: Not on file  Occupational History  . 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 Smoking status: Never Smoker  . Smokeless tobacco: Never Used  Substance and Sexual Activity  . Alcohol use: No  . Drug use: No  . Sexual activity: No  Other Topics Concern  . Not on file  Social History Narrative   SINGLE    NO CHILDREN   NEVER SMOKED   EXERCISE 3 X WK           Family History  Problem Relation Age of Onset  . Heart disease Father 8       MI '@autopsy'$   . Colon cancer Father        COLON  . Heart attack Father   . Diabetes Sister   . Diabetes Brother   . Diabetes Paternal Aunt   . Diabetes Paternal Grandmother      Review of Systems: All other systems reviewed and are otherwise negative except as noted above.  Physical Exam: Vitals:   09/16/17 1635 09/16/17 2006 09/16/17 2302 09/17/17 0413  BP: 101/60 113/82 118/78 117/73  Pulse: 74 (!) 112 97 (!) 109  Resp: 15 20 (!) 21 (!) 26  Temp: 97.7 F (36.5 C) 98.2 F (36.8 C) 97.8 F (36.6 C) 97.9 F (36.6 C)  TempSrc: Oral Oral Oral Oral  SpO2:  93% 98% 91%  Weight:    212 lb 1.6 oz (96.2 kg)  Height:        GEN- The patient is elderly appearing, alert and oriented x 3 today.   HEENT: normocephalic, atraumatic; sclera clear, conjunctiva pink; hearing intact; oropharynx clear; neck supple Lungs- Clear to ausculation bilaterally, normal work of breathing.  No wheezes, rales, rhonchi Heart- Tachycardic irregular rate and rhythm   GI- soft, non-tender, non-distended, bowel sounds present Extremities- no clubbing, cyanosis, or edema  MS- no significant deformity or atrophy Skin- warm and dry, no rash or lesion Psych- euthymic mood, full affect Neuro- strength and sensation are intact  Labs:   Lab Results  Component Value Date   WBC 4.8 09/17/2017   HGB 10.0 (L) 09/17/2017   HCT 31.4 (L) 09/17/2017   MCV 98.7 09/17/2017   PLT 152 09/17/2017    Recent Labs  Lab 09/16/17 0420  09/17/17 0430  NA 137   < > 136  K 3.7   < > 3.6  CL 106   < > 104  CO2 24   < > 23  BUN 10   < > 13  CREATININE 0.98   < > 1.05*  CALCIUM 8.8*   < > 8.8*  PROT 5.4*  --   --   BILITOT 1.1  --   --   ALKPHOS 67  --   --   ALT 14  --   --   AST 17  --   --   GLUCOSE 101*   < > 139*   < > = values in this interval not displayed.      Radiology/Studies: X-ray Chest Pa And Lateral  Result Date: 09/16/2017 CLINICAL DATA:  Encounter for congestive heart failure. EXAM: CHEST  2 VIEW COMPARISON:  09/15/2017. FINDINGS: The heart size and mediastinal contours are within normal limits. Dual lead pacer unchanged. Adenopathy with mediastinal fibrosis, stable. Unchanged Port-A-Cath. Pleural effusions better visualized on CT. No consolidation or frank edema. IMPRESSION: Stable chest. Electronically Signed   By: Staci Righter M.D.   On: 09/16/2017 09:11   EKG: AF with RVR (personally reviewed)  TELEMETRY: atrial fib/flutter with RVR, periods of V pacing (personally reviewed)  DEVICE HISTORY: MDT dual chamber PPM implanted 2014 for complete heart block   Assessment/Plan: 1.  Persistent atrial arrhythmias Amiodarone restarted yesterday IV She would like to go home and not stay for continued IV amiodarone load.  Would discharge on amiodarone '400mg'$  bid x 1 week then '400mg'$  daily. She has an appointment with Dr Caryl Comes on Friday Continue [place] for [facility] of 4  2.  WCT There are clear pacing spikes preceding beats Likely atrial events  falling in refractory with tracking at upper rate Device interrogation today demonstrates intermittent mode switching 2/2 atrial blanking of flutter waves. Device reprogrammed to DDIR today to prevent pacing at MTR.   3.  OSA Continue CPAP  Ok to discharge to home from EP standpoint Follow up with Dr Caryl Comes as scheduled on Friday.   Signed, Chanetta Marshall, NP 09/17/2017 8:40 AM   I have seen and examined this patient with Chanetta Marshall.  Agree with above, note added to reflect my findings.  On exam, tachycardic, regular, no murmurs, lungs clear.  She has a long-standing history of multiple atrial arrhythmias with ablation x2 for atrial fibrillation as well as a surgical ablation.  She has been in atrial flutter for what appears to be many weeks with heart rates in the low 100s.  She is currently on IV amiodarone.  This was previously stopped due to shortness of breath, but she received 2 units of packed red cells which improved her shortness of breath and she is currently at baseline.  She has recently finished chemotherapy for B-cell lymphoma.  We Doreene Forrey restart her on amiodarone and switch her to p.o.  She can be discharged from the hospital at this time with follow-up already arranged in the EP clinic.  Mariajose Mow M. Kolina Kube MD 09/17/2017 12:27 PM

## 2017-09-17 NOTE — H&P (View-Only) (Signed)
ELECTROPHYSIOLOGY CONSULT NOTE    Patient ID: Kayla Harrison MRN: [id number], DOB/AGE: 1946-04-03 71 y.o.  Admit date: 09/15/2017 Date of Consult: 09/17/2017  Primary Physician: [name], MD Electrophysiologist: Caryl Comes  Patient Profile: Kayla Harrison is a 71 y.o. female with a history of persistent atrial arrhythmias, B cell lymphoma s/p chemo, and prior CVA who is being seen today for the evaluation of AF with RVR and WCT at the request of Dr Radford Pax.  HPI:  Kayla Harrison is a 72 y.o. female with the above past medical history. She has undergone PVI at [facility] in 2008 as well as convergent AF ablation in 6295 complicated by complete heart block and is s/p MDT pacemaker. In May of this year, she was found to have lymphoma and has undergone chemo and is now in remission. She was admitted for tachycardia and found to be in recurrent AF with RVR.  Her amiodarone was discontinued in October 2/2 concerns for shortness of breath.  She was also found to be anemic and given a transfusion which helped her shortness of breath significantly.  She had some WCT yesterday on telemetry and was started on IV amiodarone.  EP has been asked to evaluate for treatment options.  She currently feels "ok" and wants to go home. She does not want to stay in the hospital any longer.  She has had some lightheaded spells but no pre-syncope or syncope.  EF was normal by echo in September of this year.   Device interrogation demonstrates atrial flutter with A blanking resulting in intermittent mode switching and V pacing at MTR.  No VT recorded by device.  Device reprogrammed today to DDIR to prevent inappropriate pacing at MTR.  She denies chest pain, PND, orthopnea, nausea, vomiting, syncope, edema, weight gain, or early satiety.  Past Medical History:  Diagnosis Date  . Anemia   . Arthritis    osteoarthritis - knees   . Atrial fibrillation [facility])    ablation- 2x's-- 1st time- [facility], 2nd  event at [facility] in 2008. Convergent ablation at [facility] 6/14  . Blood transfusion without reported diagnosis   . Breast cancer ([place])    Dr Margot Chimes, total thyroidectomy- 1999- for cancer  . Colon polyp    Dr Earlean Shawl  . Complication of anesthesia    Ketamine produces LSD reaction, bright colored nightmarish experience   . Dyslipidemia   . H/O pleural effusion    s/p thoracentesis w 3260m withdrawn  . Hepatitis    [place]hepatitis   . History of dysphagia    due to radiation therapy  . History of kidney stones   . Hx of thyroid cancer    Dr SForde Dandy . Hyperlipidemia   . Hypothyroidism   . Lung cancer, lower lobe ([place] 01[place]   radiation RX completed 03/04/17; will start chemo 6/27  . Morbid obesity ([place]    Status post lap band surgery  . Persistent atrial fibrillation ([place]    a. s/p PVI 2008 b. s/p convergent ablation 22841complicated by bradycardia requiring pacemaker implant  . Sinus node dysfunction ([place]    Complicating convergent ablation 6/14  . [place]    [id number]     Surgical History:  Past Surgical History:  Procedure Laterality Date  . ABDOMINAL HYSTERECTOMY  1983  . afib ablation     a. 2008 PVI b. 2014 convergent ablation  . APPENDECTOMY    . BONE MARROW  BIOPSY  02/21/2017  . BREAST LUMPECTOMY  2010  . bso  1998  . CARDIOVERSION  10/09/2012   Procedure: CARDIOVERSION;  Surgeon: Minus Breeding, MD;  Location: [place];  Service: Cardiovascular;  Laterality: N/A;  . CARDIOVERSION  10/09/2012   Procedure: CARDIOVERSION;  Surgeon: Minus Breeding, MD;  Location: [facility] ENDOSCOPY;  Service: Cardiovascular;  Laterality: N/A;  [name] gave the ok to add pt to the add on , but we must check to find out if the can add pt on at [phone] ( [phone])  . CARDIOVERSION N/A 11/20/2012   Procedure: CARDIOVERSION;  Surgeon: Fay Records, MD;  Location: [facility] ENDOSCOPY;  Service: Cardiovascular;  Laterality: N/A;  . CARDIOVERSION N/A 07/18/2017    Procedure: CARDIOVERSION;  Surgeon: Thayer Headings, MD;  Location: [place];  Service: Cardiovascular;  Laterality: N/A;  . CHOLECYSTECTOMY    . COLONOSCOPY W/ POLYPECTOMY     Dr Earlean Shawl  . CYSTOSCOPY N/A 02/06/2015   Procedure: CYSTOSCOPY;  Surgeon: Kathie Rhodes, MD;  Location[facility];  Service: Urology;  Laterality: N/A;  . CYSTOSCOPY WITH RETROGRADE PYELOGRAM, URETEROSCOPY AND STENT PLACEMENT Right 02/06/2015   Procedure: RETROGRADE PYELOGRAM, RIGHT URETEROSCOPY STENT PLACEMENT;  Surgeon: Kathie Rhodes, MD;  Location: [facility];  Service: Urology;  Laterality: Right;  . CYSTOSCOPY WITH RETROGRADE PYELOGRAM, URETEROSCOPY AND STENT PLACEMENT Right 03/07/2017   Procedure: CYSTOSCOPY WITH RIGHT RETROGRADE PYELOGRAM,RIGHT  URETEROSCOPYLASER LITHOTRIPSY  AND STENT PLACEMENT AND STONE BASKETRY;  Surgeon: Kathie Rhodes, MD;  Location: [place];  Service: Urology;  Laterality: Right;  . EYE SURGERY     cataract surgery  . HOLMIUM LASER APPLICATION N/A 0/16/0109   Procedure: HOLMIUM LASER APPLICATION;  Surgeon: Kathie Rhodes, MD;  Location: [facility];  Service: Urology;  Laterality: N/A;  . HOLMIUM LASER APPLICATION Right 12/14/5571   Procedure: HOLMIUM LASER APPLICATION;  Surgeon: Kathie Rhodes, MD;  Location: [facility];  Service: Urology;  Laterality: Right;  . IR FLUORO GUIDE PORT INSERTION RIGHT  02/24/2017  . IR PATIENT EVAL TECH 0-60 MINS  03/11/2017  . IR US GUIDE VASC ACCESS RIGHT  02/24/2017  . KNEE ARTHROSCOPY     bilateral  . LAPAROSCOPIC GASTRIC BANDING  07/10/2010  . LAPAROTOMY     for ruptured ovary and ovarian artery   . PACEMAKER INSERTION  03/10/2013   MDT dual chamber PPM  . POCKET REVISION N/A 12/08/2013   Procedure: POCKET REVISION;  Surgeon: Deboraha Sprang, MD;  Location: [facility] CATH LAB;  Service: Cardiovascular;  Laterality: N/A;  . THYROIDECTOMY  1998   Dr Margot Chimes  . TONSILLECTOMY    . TOTAL KNEE ARTHROPLASTY  04/13/2012   Procedure: TOTAL KNEE  ARTHROPLASTY;  Surgeon: Rudean Haskell, MD;  Location: [place];  Service: Orthopedics;  Laterality: Right;  Marland Kitchen VIDEO BRONCHOSCOPY WITH ENDOBRONCHIAL ULTRASOUND N/A 02/07/2017   Procedure: VIDEO BRONCHOSCOPY WITH ENDOBRONCHIAL ULTRASOUND;  Surgeon: Marshell Garfinkel, MD;  Location: [place];  Service: Pulmonary;  Laterality: N/A;     Medications Prior to Admission  Medication Sig Dispense Refill Last Dose  . cycloSPORINE (RESTASIS) 0.05 % ophthalmic emulsion Place 1 drop into both eyes 2 (two) times daily.   09/14/2017 at Unknown time  . diphenhydramine-acetaminophen (TYLENOL PM) 25-500 MG TABS tablet Take 1 tablet by mouth at bedtime as needed.   09/14/2017 at Unknown time  . ELIQUIS 5 MG TABS tablet TAKE 1 TABLET BY MOUTH TWO  TIMES DAILY 180 tablet 1 09/14/2017 at 2000  . levothyroxine (SYNTHROID, LEVOTHROID)  175 MCG tablet Take 175 mcg by mouth daily before breakfast.   09/14/2017 at Unknown time  . lidocaine-prilocaine (EMLA) cream Apply to affected area once 30 g 3 0ctober for chemo use  . Lifitegrast (XIIDRA) 5 % SOLN Apply 1 drop to eye 2 (two) times daily.   09/14/2017 at Unknown time  . Multiple Vitamin (MULTIVITAMIN) tablet Take 1 tablet by mouth daily.    Past Week at Unknown time  . acetaminophen (TYLENOL 8 HOUR) 650 MG CR tablet Take 1 tablet (650 mg total) by mouth every 8 (eight) hours as needed for pain. (Patient not taking: Reported on 08/28/2017) 30 tablet 0 Not Taking at Unknown time  . allopurinol (ZYLOPRIM) 300 MG tablet Take 1 tablet (300 mg total) by mouth daily. (Patient not taking: Reported on 08/28/2017) 30 tablet 3 Completed Course at Unknown time  . dicyclomine (BENTYL) 20 MG tablet Take 1 tablet (20 mg total) by mouth every 6 (six) hours. As needed (Patient not taking: Reported on 08/28/2017) 60 tablet 1 Completed Course at Unknown time  . diphenoxylate-atropine (LOMOTIL) 2.5-0.025 MG tablet TAKE 2 TABLETS BY MOUTH TWICE DAILY AS NEEDED FOR DIARRHEA OR LOOSE STOOL (Patient not  taking: Reported on 08/28/2017) 30 tablet 0 Completed Course at Unknown time  . HYDROcodone-homatropine (HYCODAN) 5-1.5 MG/5ML syrup Take 5 mLs by mouth every 6 (six) hours as needed for cough. (Patient not taking: Reported on 08/28/2017) 120 mL 0 Not Taking at Unknown time  . LORazepam (ATIVAN) 0.5 MG tablet Take 1 tablet (0.5 mg total) by mouth at bedtime. (Patient not taking: Reported on 08/28/2017) 30 tablet 0 Not Taking at Unknown time  . ondansetron (ZOFRAN) 8 MG tablet Take 1 tablet (8 mg total) by mouth 2 (two) times daily as needed for refractory nausea / vomiting. Start on day 3 after cyclophosphamide chemotherapy. (Patient not taking: Reported on 08/28/2017) 30 tablet 1 Not Taking at Unknown time  . Oxycodone HCl 10 MG TABS Take 1 tablet (10 mg total) by mouth every 4 (four) hours as needed. (Patient not taking: Reported on 08/28/2017) 20 tablet 0 Not Taking at Unknown time  . sucralfate (CARAFATE) 1 g tablet Take 1 tablet (1 g total) by mouth 4 (four) times daily. (Patient not taking: Reported on 08/28/2017) 120 tablet 2 Not Taking at Unknown time    Inpatient Medications:  . amiodarone  400 mg Oral BID  . apixaban  5 mg Oral BID  . cycloSPORINE  1 drop Both Eyes BID  . feeding supplement (ENSURE ENLIVE)  237 mL Oral BID BM  . levothyroxine  125 mcg Oral QAC breakfast  . metoprolol tartrate  25 mg Oral BID  . multivitamin with minerals  1 tablet Oral Daily  . polyvinyl alcohol  1 drop Both Eyes BID    Allergies:  Allergies  Allergen Reactions  . Tikosyn [Dofetilide] Other (See Comments)    Prolonged QT interval Prolonged QT interval  . Xarelto [Rivaroxaban] Other (See Comments)    Nose Bleed X 6 hrs ; packing in ER  . Epinephrine Other (See Comments) and Rash    Dental form only (liquid). Patient stated she will become "out of it" she can hear you but cannot respond in normal fashion.  Marland Kitchen Ketamine     Hallucinations  . Oral Anesthetic [Benzocaine]   . Adhesive [Tape] Rash     Paper tape as well    Social History   Socioeconomic History  . Marital status: Single    Spouse name:  Not on file  . Number of children: 0  . Years of education: Not on file  . Highest education level: Not on file  Social Needs  . Financial resource strain: Not on file  . Food insecurity - worry: Not on file  . Food insecurity - inability: Not on file  . Transportation needs - medical: Not on file  . Transportation needs - non-medical: Not on file  Occupational History  . 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 Smoking status: Never Smoker  . Smokeless tobacco: Never Used  Substance and Sexual Activity  . Alcohol use: No  . Drug use: No  . Sexual activity: No  Other Topics Concern  . Not on file  Social History Narrative   SINGLE    NO CHILDREN   NEVER SMOKED   EXERCISE 3 X WK           Family History  Problem Relation Age of Onset  . Heart disease Father 60       MI '@autopsy'$   . Colon cancer Father        COLON  . Heart attack Father   . Diabetes Sister   . Diabetes Brother   . Diabetes Paternal Aunt   . Diabetes Paternal Grandmother      Review of Systems: All other systems reviewed and are otherwise negative except as noted above.  Physical Exam: Vitals:   09/16/17 1635 09/16/17 2006 09/16/17 2302 09/17/17 0413  BP: 101/60 113/82 118/78 117/73  Pulse: 74 (!) 112 97 (!) 109  Resp: 15 20 (!) 21 (!) 26  Temp: 97.7 F (36.5 C) 98.2 F (36.8 C) 97.8 F (36.6 C) 97.9 F (36.6 C)  TempSrc: Oral Oral Oral Oral  SpO2:  93% 98% 91%  Weight:    212 lb 1.6 oz (96.2 kg)  Height:        GEN- The patient is elderly appearing, alert and oriented x 3 today.   HEENT: normocephalic, atraumatic; sclera clear, conjunctiva pink; hearing intact; oropharynx clear; neck supple Lungs- Clear to ausculation bilaterally, normal work of breathing.  No wheezes, rales, rhonchi Heart- Tachycardic irregular rate and rhythm   GI- soft, non-tender, non-distended, bowel sounds present Extremities- no clubbing, cyanosis, or edema  MS- no significant deformity or atrophy Skin- warm and dry, no rash or lesion Psych- euthymic mood, full affect Neuro- strength and sensation are intact  Labs:   Lab Results  Component Value Date   WBC 4.8 09/17/2017   HGB 10.0 (L) 09/17/2017   HCT 31.4 (L) 09/17/2017   MCV 98.7 09/17/2017   PLT 152 09/17/2017    Recent Labs  Lab 09/16/17 0420  09/17/17 0430  NA 137   < > 136  K 3.7   < > 3.6  CL 106   < > 104  CO2 24   < > 23  BUN 10   < > 13  CREATININE 0.98   < > 1.05*  CALCIUM 8.8*   < > 8.8*  PROT 5.4*  --   --   BILITOT 1.1  --   --   ALKPHOS 67  --   --   ALT 14  --   --   AST 17  --   --   GLUCOSE 101*   < > 139*   < > = values in this interval not displayed.      Radiology/Studies: X-ray Chest Pa And Lateral  Result Date: 09/16/2017 CLINICAL DATA:  Encounter for congestive heart failure. EXAM: CHEST  2 VIEW COMPARISON:  09/15/2017. FINDINGS: The heart size and mediastinal contours are within normal limits. Dual lead pacer unchanged. Adenopathy with mediastinal fibrosis, stable. Unchanged Port-A-Cath. Pleural effusions better visualized on CT. No consolidation or frank edema. IMPRESSION: Stable chest. Electronically Signed   By: Staci Righter M.D.   On: 09/16/2017 09:11   EKG: AF with RVR (personally reviewed)  TELEMETRY: atrial fib/flutter with RVR, periods of V pacing (personally reviewed)  DEVICE HISTORY: MDT dual chamber PPM implanted 2014 for complete heart block   Assessment/Plan: 1.  Persistent atrial arrhythmias Amiodarone restarted yesterday IV She would like to go home and not stay for continued IV amiodarone load.  Would discharge on amiodarone '400mg'$  bid x 1 week then '400mg'$  daily. She has an appointment with Dr Caryl Comes on Friday Continue [place] for [facility] of 4  2.  WCT There are clear pacing spikes preceding beats Likely atrial events  falling in refractory with tracking at upper rate Device interrogation today demonstrates intermittent mode switching 2/2 atrial blanking of flutter waves. Device reprogrammed to DDIR today to prevent pacing at MTR.   3.  OSA Continue CPAP  Ok to discharge to home from EP standpoint Follow up with Dr Caryl Comes as scheduled on Friday.   Signed, Chanetta Marshall, NP 09/17/2017 8:40 AM   I have seen and examined this patient with Chanetta Marshall.  Agree with above, note added to reflect my findings.  On exam, tachycardic, regular, no murmurs, lungs clear.  She has a long-standing history of multiple atrial arrhythmias with ablation x2 for atrial fibrillation as well as a surgical ablation.  She has been in atrial flutter for what appears to be many weeks with heart rates in the low 100s.  She is currently on IV amiodarone.  This was previously stopped due to shortness of breath, but she received 2 units of packed red cells which improved her shortness of breath and she is currently at baseline.  She has recently finished chemotherapy for B-cell lymphoma.  We will restart her on amiodarone and switch her to p.o.  She can be discharged from the hospital at this time with follow-up already arranged in the EP clinic.  Will M. Camnitz MD 09/17/2017 12:27 PM

## 2017-09-17 NOTE — Discharge Summary (Signed)
[facility]  Physician Discharge Summary   Patient ID: Kayla Harrison MRN: [id number] DOB/AGE: 10/16/1945 71 y.o.  Admit date: 09/15/2017 Discharge date: 09/17/2017  PCP: [name], MD  DISCHARGE DIAGNOSES:  Active Problems:   HYPERLIPIDEMIA   Morbid obesity ([place])   Atrial fibrillation ([place])   Chronic diastolic heart failure (H[facility])   BREAST CANCER, HX OF   CARCINOMA, THYROID GLAND, HX OF   COLONIC POLYPS, HX OF   Long term (current) use of anticoagulants   Pleural effusion   Hypothyroidism   OSA (obstructive sleep apnea)   Non-Hodgkin lymphoma, unspecified, intrathoracic lymph nodes ([place])   Port catheter in place   Atrial flutter ([place])   Atrial fibrillation with RVR (H[facility])   RECOMMENDATIONS FOR OUTPATIENT FOLLOW UP: 1. Patient has an appointment with her electrophysiologist on Friday. 2. Pulmonology (Dr. Halford Chessman) to arrange outpatient consultation  DISCHARGE CONDITION: fair  Diet recommendation: As before  [facility] Weights   09/15/17 0910 09/16/17 0411 09/17/17 0413  Weight: 96.6 kg (213 lb) 94.8 kg (209 lb 1.6 oz) 96.2 kg (212 lb 1.6 oz)    INITIAL HISTORY: 71 year old Caucasian female with a past medical history including atrial fibrillation status post ablation, anemia, hypertension, hyperlipidemia, history of breast and thyroid cancer, history of lung cancer and formal for which she recently completed chemotherapy and is in remission, history of pacemaker placement for sinus node dysfunction.  She presented with 3-day history of palpitations and shortness of breath.  Found to have atrial flutter.  Hospitalized for further management.  Consultations:  Cardiology  Electrophysiology  Procedures: Transthoracic echocardiogram Study Conclusions  - Left ventricle: The cavity size was normal. Wall thickness was   normal. Systolic function was normal. The estimated ejection   fraction was in the range of 55% to 60%. Wall motion was normal;   there  were no regional wall motion abnormalities. The study is   not technically sufficient to allow evaluation of LV diastolic   function. - Aortic valve: There was trivial regurgitation. - Mitral valve: Calcified annulus. There was mild regurgitation. - Left atrium: The atrium was severely dilated. - Tricuspid valve: There was moderate regurgitation. - Pulmonary arteries: Systolic pressure was mildly increased. PA   peak pressure: 44 mm Hg (S). - Pericardium, extracardiac: A trivial pericardial effusion was   identified.  Impressions:  - Normal LV systolic function; trace AI; mild MR; moderate to   severe LAE; moderate TR with mildly elevated pulmonary pressure.   HOSPITAL COURSE:   Atrial flutter with RVR Patient with known history of atrial fibrillation.  She is on anticoagulation with Eliquis.  Was noted to be in atrial flutter in October.  Cardioversion was done on 10/26 for atrial flutter.  Patient appears to be back in the same rhythm.  Currently on diltiazem infusion.    Cardiology was consulted.  Metoprolol was added.  Patient then developed ventricular ectopy.  Amiodarone was initiated.  Heart rate is better.  Electrophysiology was consulted.  They have evaluated patient this morning.  Patient wants to go home.  They recommend discharging patient on amiodarone 400 mg twice a day.  Patient has an appointment with her own electrophysiologist on Friday.  She feels well enough to go home.    Dyspnea/Bilateral pleural effusions Etiology for her dyspnea is likely multifactorial including atrial flutter and bilateral pleural effusion.  No PE noted on CT scan. Patient also noted to have bilateral pleural effusion right greater than left.  This could also be contributing to her  symptoms. Pleural effusions are noted more so on the CT scan than on plain films.  They appear to have increased in size compared to previous imaging studies.  Etiology seems unclear.  Since it is bilateral could be due  to a cardiac etiology.  Echocardiogram done in September showed normal systolic function.    Echocardiogram repeated here also shows normal systolic function.  She does have Lasix at home that she can take if she develops edema.  She also has lower extremity edema.  Patient was told to take her Lasix daily until she sees her cardiologist on Friday.  Patient has previously seen Dr. Halford Chessman with pulmonology for pleural effusion.  Discussed with him briefly this morning.  He will arrange outpatient consultation.  Chronic diastolic congestive heart failure See discussion above.  Normocytic anemia Most likely secondary to anemia of chronic disease.  No evidence for overt bleeding.  Non-Hodgkin's lymphoma in remission Patient followed by oncology.  She completed chemotherapy recently.  Outpatient follow-up.  History of essential hypertension Management as before.  Urinary tract infection UA was noted to be abnormal.  She was started on ceftriaxone.  Urine cultures without any growth.  No need to continue antibiotics.  History of hypothyroidism/thyroid cancer Patient noted to be on levothyroxine.  Her free T4 is noted to be elevated.  TSH is normal.  In view of her atrial flutter we will go ahead and reduce the dose of her Synthroid.  History of obstructive sleep apnea Patient mentioned that she does not use CPAP anymore.  History of nephrolithiasis with right-sided hydronephrosis Followed by urology, Dr. Karsten Ro.  She had to undergo ureteral stent placement in June 2018.  Creatinine is normal. Outpatient follow-up with urology.  She is supposed to have nephrostomy tubes placed in the next 1-2 weeks.  Overall stable.  Patient really wants to go home.  Cleared by cardiology.  Okay for discharge.   PERTINENT LABS:  The results of significant diagnostics from this hospitalization (including imaging, microbiology, ancillary and laboratory) are listed below for reference.     Microbiology: Recent Results (from the past 240 hour(s))  Urine culture     Status: Abnormal   Collection Time: 09/15/17 12:50 PM  Result Value Ref Range Status   Specimen Description URINE, CLEAN CATCH  Final   Special Requests NONE  Final   Culture <10,000 COLONIES/mL (A)  Final   Report Status 09/16/2017 FINAL  Final  MRSA PCR Screening     Status: None   Collection Time: 09/15/17  4:38 PM  Result Value Ref Range Status   MRSA by PCR NEGATIVE NEGATIVE Final    Comment:        The GeneXpert MRSA Assay (FDA approved for NASAL specimens only), is one component of a comprehensive MRSA colonization surveillance program. It is not intended to diagnose MRSA infection nor to guide or monitor treatment for MRSA infections.      Labs: Basic Metabolic Panel: Recent Labs  Lab 09/15/17 0928 09/16/17 0420 09/16/17 1149 09/17/17 0430  NA 140 137 136 136  K 4.1 3.7 3.6 3.6  CL 109 106 105 104  CO2 24 24 24 23   GLUCOSE 102* 101* 131* 139*  BUN 11 10 10 13   CREATININE 0.93 0.98 1.00 1.05*  CALCIUM 8.8* 8.8* 8.9 8.8*  MG 1.9  --  1.9  --    Liver Function Tests: Recent Labs  Lab 09/15/17 0928 09/16/17 0420  AST 16 17  ALT 15 14  ALKPHOS 62  67  BILITOT 1.2 1.1  PROT 5.5* 5.4*  ALBUMIN 3.0* 3.0*   CBC: Recent Labs  Lab 09/15/17 0928 09/16/17 0420 09/17/17 0430  WBC 4.2 5.3 4.8  NEUTROABS 3.0  --   --   HGB 10.2* 10.3* 10.0*  HCT 32.6* 32.1* 31.4*  MCV 99.7 98.8 98.7  PLT 156 158 152   Cardiac Enzymes: Recent Labs  Lab 09/15/17 0928 09/15/17 1622 09/15/17 2050 09/15/17 2335  TROPONINI 0.05* 0.05* 0.06* 0.06*   BNP: BNP (last 3 results) Recent Labs    02/03/17 0947 04/23/17 0000 09/15/17 0828  BNP 70.5 113.8* 250.2*     IMAGING STUDIES X-ray Chest Pa And Lateral  Result Date: 09/16/2017 CLINICAL DATA:  Encounter for congestive heart failure. EXAM: CHEST  2 VIEW COMPARISON:  09/15/2017. FINDINGS: The heart size and mediastinal contours are  within normal limits. Dual lead pacer unchanged. Adenopathy with mediastinal fibrosis, stable. Unchanged Port-A-Cath. Pleural effusions better visualized on CT. No consolidation or frank edema. IMPRESSION: Stable chest. Electronically Signed   By: Staci Righter M.D.   On: 09/16/2017 09:11   Ct Angio Chest Pe W And/or Wo Contrast  Result Date: 09/15/2017 CLINICAL DATA:  Difficulty breathing. EXAM: CT ANGIOGRAPHY CHEST WITH CONTRAST TECHNIQUE: Multidetector CT imaging of the chest was performed using the standard protocol during bolus administration of intravenous contrast. Multiplanar CT image reconstructions and MIPs were obtained to evaluate the vascular anatomy. CONTRAST:  156mL ISOVUE-370 IOPAMIDOL (ISOVUE-370) INJECTION 76% COMPARISON:  CT scan of May 27, 2017. PET scan of August 27, 2017. FINDINGS: Cardiovascular: Satisfactory opacification of the pulmonary arteries to the segmental level. No evidence of pulmonary embolism. No pericardial effusion. Mediastinum/Nodes: Stable right peritracheal in subcarinal adenopathy is noted consistent with history of lymphoma. Esophagus is unremarkable. Status post thyroidectomy. Lungs/Pleura: No pneumothorax is noted. Bilateral pleural effusions are noted with right greater than left, with adjacent subsegmental atelectasis. Stable probable radiation fibrosis is noted medially in right upper lobe. Upper Abdomen: Status post lap band procedure. Musculoskeletal: No chest wall abnormality. No acute or significant osseous findings. Review of the MIP images confirms the above findings. IMPRESSION: No definite evidence of pulmonary embolus. Moderate right pleural effusion is noted as well as mild left pleural effusion, with adjacent subsegmental atelectasis. Probable radiation fibrosis is noted medially in right upper lobe. Stable mediastinal adenopathy compared to prior exam. Electronically Signed   By: Marijo Conception, M.D.   On: 09/15/2017 13:41    Dg Chest  Portable 1 View  Result Date: 09/15/2017 CLINICAL DATA:  Shortness of Breath.  B-cell lymphoma EXAM: PORTABLE CHEST 1 VIEW COMPARISON:  PET-CT August 27, 2017; chest radiograph June 04, 2017 FINDINGS: There is scarring with presumed radiation fibrosis in the right medial thorax comfortably the right paratracheal region. There is no edema or consolidation. There is a degree of volume loss on the right. Heart size and pulmonary vascularity are normal. No adenopathy is appreciable by radiography. Port-A-Cath tip is in the superior vena cava. Pacemaker leads are attached to the right atrium and right ventricle. There is a left atrial appendage clamp. There is postoperative change in the upper thoracic region in the midline. No bone lesions. IMPRESSION: Radiation fibrosis right peritracheal region. There is volume loss on the right. There is no edema or consolidation. Stable cardiac silhouette. No adenopathy demonstrable. No pneumothorax. Electronically Signed   By: Lowella Grip III M.D.   On: 09/15/2017 09:46    DISCHARGE EXAMINATION: Vitals:   09/16/17 2006 09/16/17 2302 09/17/17  0413 09/17/17 1015  BP: 113/82 118/78 117/73 125/84  Pulse: (!) 112 97 (!) 109   Resp: 20 (!) 21 (!) 26 (!) 24  Temp: 98.2 F (36.8 C) 97.8 F (36.6 C) 97.9 F (36.6 C)   TempSrc: Oral Oral Oral   SpO2: 93% 98% 91%   Weight:   96.2 kg (212 lb 1.6 oz)   Height:       General appearance: alert, cooperative, appears stated age and no distress Resp: clear to auscultation bilaterally Cardio: S1-S2 regular. mildly tachycardic. GI: soft, non-tender; bowel sounds normal; no masses,  no organomegaly  DISPOSITION: Home  Discharge Instructions    Ambulatory referral to Pulmonology   Complete by:  As directed    Patient seen previously by Dr. Halford Chessman for pleural effusion and dyspnea. Now with similar complaints. Please arrange for an appointment/consultation. Thanks   Call MD for:  extreme fatigue   Complete by:  As  directed    Call MD for:  persistant dizziness or light-headedness   Complete by:  As directed    Call MD for:  persistant nausea and vomiting   Complete by:  As directed    Call MD for:  severe uncontrolled pain   Complete by:  As directed    Call MD for:  temperature >100.4   Complete by:  As directed    Diet - low sodium heart healthy   Complete by:  As directed    Discharge instructions   Complete by:  As directed    Please take your medications as prescribed. Follow up with Dr. Caryl Comes on Friday as scheduled. Seek attention if you develop chest pain, worsening shortness of breath, lightheadedness. Avoid exertion. Check weight daily.  You were cared for by a hospitalist during your hospital stay. If you have any questions about your discharge medications or the care you received while you were in the hospital after you are discharged, you can call the unit and asked to speak with the hospitalist on call if the hospitalist that took care of you is not available. Once you are discharged, your primary care physician will handle any further medical issues. Please note that NO REFILLS for any discharge medications will be authorized once you are discharged, as it is imperative that you return to your primary care physician (or establish a relationship with a primary care physician if you do not have one) for your aftercare needs so that they can reassess your need for medications and monitor your lab values. If you do not have a primary care physician, you can call [phone] for a physician referral.   Increase activity slowly   Complete by:  As directed         Allergies as of 09/17/2017      Reactions   Tikosyn [dofetilide] Other (See Comments)   Prolonged QT interval Prolonged QT interval   Xarelto [rivaroxaban] Other (See Comments)   Nose Bleed X 6 hrs ; packing in ER   Epinephrine Other (See Comments), Rash   Dental form only (liquid). Patient stated she will become "out of it" she  can hear you but cannot respond in normal fashion.   Ketamine    Hallucinations   Oral Anesthetic [benzocaine]    Adhesive [tape] Rash   Paper tape as well      Medication List    STOP taking these medications   acetaminophen 650 MG CR tablet Commonly known as:  TYLENOL 8 HOUR   allopurinol 300 MG  tablet Commonly known as:  ZYLOPRIM   dicyclomine 20 MG tablet Commonly known as:  BENTYL   diphenoxylate-atropine 2.5-0.025 MG tablet Commonly known as:  LOMOTIL   HYDROcodone-homatropine 5-1.5 MG/5ML syrup Commonly known as:  HYCODAN   lidocaine-prilocaine cream Commonly known as:  EMLA   LORazepam 0.5 MG tablet Commonly known as:  ATIVAN   ondansetron 8 MG tablet Commonly known as:  ZOFRAN   Oxycodone HCl 10 MG Tabs   sucralfate 1 g tablet Commonly known as:  CARAFATE     TAKE these medications   amiodarone 200 MG tablet Commonly known as:  PACERONE Take 2 tablets (400 mg total) by mouth 2 (two) times daily for 7 days.   cycloSPORINE 0.05 % ophthalmic emulsion Commonly known as:  RESTASIS Place 1 drop into both eyes 2 (two) times daily.   diphenhydramine-acetaminophen 25-500 MG Tabs tablet Commonly known as:  TYLENOL PM Take 1 tablet by mouth at bedtime as needed.   ELIQUIS 5 MG Tabs tablet Generic drug:  apixaban TAKE 1 TABLET BY MOUTH TWO  TIMES DAILY   furosemide 20 MG tablet Commonly known as:  LASIX Take 1 tablets daily till you see your cardiolgist   levothyroxine 137 MCG tablet Commonly known as:  SYNTHROID, LEVOTHROID Take 1 tablet (137 mcg total) by mouth daily before breakfast. What changed:    medication strength  how much to take   metoprolol tartrate 25 MG tablet Commonly known as:  LOPRESSOR Take 1 tablet (25 mg total) by mouth 2 (two) times daily.   multivitamin tablet Take 1 tablet by mouth daily.   XIIDRA 5 % Soln Generic drug:  Lifitegrast Apply 1 drop to eye 2 (two) times daily.        Follow-up Information     Deboraha Sprang, MD Follow up.   Specialty:  Cardiology Why:  Appt on 09/19/17 at 9:30AM Contact information: [address] [address] [phone]           TOTAL DISCHARGE TIME: 35 mins  Bonnielee Haff  [facility] Pager [phone]  09/17/2017, 2:57 PM

## 2017-09-19 ENCOUNTER — Encounter (HOSPITAL_COMMUNITY): Payer: Self-pay

## 2017-09-19 ENCOUNTER — Ambulatory Visit (INDEPENDENT_AMBULATORY_CARE_PROVIDER_SITE_OTHER): Payer: Medicare Other | Admitting: Internal Medicine

## 2017-09-19 ENCOUNTER — Telehealth: Payer: Self-pay | Admitting: Internal Medicine

## 2017-09-19 ENCOUNTER — Encounter: Payer: Self-pay | Admitting: Internal Medicine

## 2017-09-19 VITALS — BP 110/76 | HR 118 | Ht 66.0 in | Wt 210.0 lb

## 2017-09-19 DIAGNOSIS — I480 Paroxysmal atrial fibrillation: Secondary | ICD-10-CM

## 2017-09-19 DIAGNOSIS — I442 Atrioventricular block, complete: Secondary | ICD-10-CM | POA: Diagnosis not present

## 2017-09-19 DIAGNOSIS — I4892 Unspecified atrial flutter: Secondary | ICD-10-CM | POA: Diagnosis not present

## 2017-09-19 DIAGNOSIS — I6350 Cerebral infarction due to unspecified occlusion or stenosis of unspecified cerebral artery: Secondary | ICD-10-CM

## 2017-09-19 DIAGNOSIS — Z950 Presence of cardiac pacemaker: Secondary | ICD-10-CM

## 2017-09-19 MED ORDER — AMIODARONE HCL 200 MG PO TABS: 200.0000 mg | ORAL_TABLET | Freq: Every day | ORAL | 2 refills | Status: DC

## 2017-09-19 MED ORDER — AMIODARONE HCL 200 MG PO TABS: ORAL_TABLET | ORAL | 0 refills | Status: DC

## 2017-09-19 NOTE — Progress Notes (Addendum)
Patient Care Team: [name], MD as PCP - General (Endocrinology)   HPI  Kayla Harrison is a 71 y.o. female Seen in followup for a pacemaker implanted at [facility] and atrial fibrillation.  She underwent pulmonary vein isolation at [facility] and had recurrent atrial fibrillation and underwent convergent ablation at [facility] 9/37; it was complicated by heart block prompting pacemaker implantation.   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 She was not tolerated  NOACs and remains on warfarin    Hospitalized 12/18 for wide-complex tachycardia which was thought to be related to intermittent pacing because of atrial flutter with blanking.  She is reprogrammed DDIR  Amiodarone resumed  She remains in flutter with significant dyspnea    She also has bilateral pelvic kidney stones for which nephrostomy and stone retrieval is scheduled 1/4   Have reached out to Dr Modena Jansky partner Dr Lovena Neighbours has looked at data See Below    Date TSH ALT Hgb  8/18 0.2 15 11.9  9/18                     9.3   WBC 1.5      DATE TEST    2011 Echo    EF 55 % LAE 40 mm  7/14 Echo    EF 55-60 %   9/18 Echo  EF 55-60%   12/18 Echo  EF 55-60% Severe LAE ?      She also has a history of morbid obesity status post lap band surgery    Thromboembolic risk factors ( age -31 day morning, TIA/CVA-2, Gender-1) for a CHADSVASc Score of 4   Past Medical History:  Diagnosis Date  . Anemia   . Arthritis    osteoarthritis - knees   . Atrial fibrillation [facility])    ablation- 2x's-- 1st time- [facility], 2nd event at [facility] in 2008. Convergent ablation at [facility] 6/14  . Blood transfusion without reported diagnosis   . Breast cancer ([place])    Dr Margot Chimes, total  thyroidectomy- 1999- for cancer  . Colon polyp    Dr Earlean Shawl  . Complication of anesthesia    Ketamine produces LSD reaction, bright colored nightmarish experience   . Dyslipidemia   . H/O pleural effusion    s/p thoracentesis w 3251m withdrawn  . Hepatitis    [place]itis   . History of dysphagia    due to radiation therapy  . History of kidney stones   . Hx of thyroid cancer    Dr SForde Dandy . Hyperlipidemia   . Hypothyroidism   . Lung cancer, lower lobe ([facility] 01/2017   radiation RX completed 03/04/17; will start chemo 6/27  . Morbid [place] ([place]    Status post lap band surgery  . Persistent [place]    a. s/p PVI 2008 b. s/p convergent ablation 23428complicated by bradycardia requiring pacemaker implant  . [place]us node dysfunction [place]    Complicating convergent ablation 6/14  . [place]    [place]    Past Surgical History:  Procedure Laterality Date  . ABDOMINAL HYSTERECTOMY  1983  . afib ablation  a. 2008 PVI b. 2014 convergent ablation  . APPENDECTOMY    . BONE MARROW BIOPSY  02/21/2017  . BREAST LUMPECTOMY  2010  . bso  1998  . CARDIOVERSION  10/09/2012   Procedure: CARDIOVERSION;  Surgeon: Minus Breeding, MD;  Location: [place];  Service: Cardiovascular;  Laterality: N/A;  . CARDIOVERSION  10/09/2012   Procedure: CARDIOVERSION;  Surgeon: Minus Breeding, MD;  Location: [facility] ENDOSCOPY;  Service: Cardiovascular;  Laterality: N/A;  [name] gave the ok to add pt to the add on , but we must check to find out if the can add pt on at [phone] ( [phone])  . CARDIOVERSION N/A 11/20/2012   Procedure: CARDIOVERSION;  Surgeon: Fay Records, MD;  Location: [facility] ENDOSCOPY;  Service: Cardiovascular;  Laterality: N/A;  . CARDIOVERSION N/A 07/18/2017   Procedure: CARDIOVERSION;  Surgeon: Thayer Headings, MD;  Location: [place];  Service: Cardiovascular;  Laterality: N/A;  . CHOLECYSTECTOMY    . COLONOSCOPY W/ POLYPECTOMY      Dr Earlean Shawl  . CYSTOSCOPY N/A 02/06/2015   Procedure: CYSTOSCOPY;  Surgeon: Kathie Rhodes, MD;  Location: [facility];  Service: Urology;  Laterality: N/A;  . CYSTOSCOPY WITH RETROGRADE PYELOGRAM, URETEROSCOPY AND STENT PLACEMENT Right 02/06/2015   Procedure: RETROGRADE PYELOGRAM, RIGHT URETEROSCOPY STENT PLACEMENT;  Surgeon: Kathie Rhodes, MD;  Location: [facility];  Service: Urology;  Laterality: Right;  . CYSTOSCOPY WITH RETROGRADE PYELOGRAM, URETEROSCOPY AND STENT PLACEMENT Right 03/07/2017   Procedure: CYSTOSCOPY WITH RIGHT RETROGRADE PYELOGRAM,RIGHT  URETEROSCOPYLASER LITHOTRIPSY  AND STENT PLACEMENT AND STONE BASKETRY;  Surgeon: Kathie Rhodes, MD;  Location: [place];  Service: Urology;  Laterality: Right;  . EYE SURGERY     cataract surgery  . HOLMIUM LASER APPLICATION N/A 1/61/0960   Procedure: HOLMIUM LASER APPLICATION;  Surgeon: Kathie Rhodes, MD;  Location: [facility];  Service: Urology;  Laterality: N/A;  . HOLMIUM LASER APPLICATION Right 4/54/0981   Procedure: HOLMIUM LASER APPLICATION;  Surgeon: Kathie Rhodes, MD;  Location: [facility];  Service: Urology;  Laterality: Right;  . IR FLUORO GUIDE PORT INSERTION RIGHT  02/24/2017  . IR PATIENT EVAL TECH 0-60 MINS  03/11/2017  . IR US GUIDE VASC ACCESS RIGHT  02/24/2017  . KNEE ARTHROSCOPY     bilateral  . LAPAROSCOPIC GASTRIC BANDING  07/10/2010  . LAPAROTOMY     for ruptured ovary and ovarian artery   . PACEMAKER INSERTION  03/10/2013   MDT dual chamber PPM  . POCKET REVISION N/A 12/08/2013   Procedure: POCKET REVISION;  Surgeon: Deboraha Sprang, MD;  Location: [facility] CATH LAB;  Service: Cardiovascular;  Laterality: N/A;  . THYROIDECTOMY  1998   Dr Margot Chimes  . TONSILLECTOMY    . TOTAL KNEE ARTHROPLASTY  04/13/2012   Procedure: TOTAL KNEE ARTHROPLASTY;  Surgeon: Rudean Haskell, MD;  Location: [place];  Service: Orthopedics;  Laterality: Right;  Marland Kitchen VIDEO BRONCHOSCOPY WITH ENDOBRONCHIAL ULTRASOUND N/A 02/07/2017   Procedure:  VIDEO BRONCHOSCOPY WITH ENDOBRONCHIAL ULTRASOUND;  Surgeon: Marshell Garfinkel, MD;  Location: [place];  Service: Pulmonary;  Laterality: N/A;    Current Outpatient Medications  Medication Sig Dispense Refill  . amiodarone (PACERONE) 200 MG tablet Take 2 tablets (400 mg total) by mouth 2 (two) times daily for 7 days. 28 tablet 0  . cycloSPORINE (RESTASIS) 0.05 % ophthalmic emulsion Place 1 drop into both eyes 2 (two) times daily.    . diphenhydramine-acetaminophen (TYLENOL PM) 25-500 MG TABS tablet Take 1 tablet by mouth at bedtime as needed.    Marland Kitchen  ELIQUIS 5 MG TABS tablet TAKE 1 TABLET BY MOUTH TWO  TIMES DAILY 180 tablet 1  . furosemide (LASIX) 20 MG tablet Take 1 tablets daily till you see your cardiolgist 30 tablet 0  . levothyroxine (SYNTHROID, LEVOTHROID) 137 MCG tablet Take 1 tablet (137 mcg total) by mouth daily before breakfast. 30 tablet 0  . Lifitegrast (XIIDRA) 5 % SOLN Apply 1 drop to eye 2 (two) times daily.    . Multiple Vitamin (MULTIVITAMIN) tablet Take 1 tablet by mouth daily.      No current facility-administered medications for this visit.     Allergies  Allergen Reactions  . Tikosyn [Dofetilide] Other (See Comments)    Prolonged QT interval Prolonged QT interval  . Xarelto [Rivaroxaban] Other (See Comments)    Nose Bleed X 6 hrs ; packing in ER  . Epinephrine Other (See Comments) and Rash    Dental form only (liquid). Patient stated she will become "out of it" she can hear you but cannot respond in normal fashion.  Marland Kitchen Ketamine     Hallucinations  . Oral Anesthetic [Benzocaine]   . Adhesive [Tape] Rash    Paper tape as well    Review of Systems negative except from HPI and PMH  Physical Exam BP 110/76   Pulse (!) 118   Ht 5' 6" (1.676 m)   Wt 210 lb (95.3 kg)   SpO2 94%   BMI 33.89 kg/m  Well developed and nourished in no acute distress HENT normal Neck supple with JVP-flat Carotids brisk and full without bruits Clear Rapid amd regular rate and rhythm with  rapid ventricular response, no murmurs or gallops Abd-soft with active BS without hepatomegaly No Clubbing cyanosis edema Skin-warm and dry A & Oriented  Grossly normal sensory and motor function   ECG was ordered today atrial flutter with 2 1 conduction block and ventricular cycle length of 580 ms  Assessment and  Plan  Atrial fibrillation/flutter  S/p convergent ablation persistent  Prior Strokes  Pacemaker Medtronic MRI compatible   Labile INR  Non-Hodgkin's lymphoma on chemotherapy  Dyspnea on exertion     Treated hypothyroidism  High Risk Medication Surveillance  Nephrolithiasis  She is back on amiodarone to try to maintain regular rhythm.  She is currently in atrial flutter with a rapid rate.  Unfortunately she missed a dose of Eliquis while sitting in the emergency room.  Hence, and ideally be first submitted for repeat cardioversion with antecedent TEE.  We will try to arrange that for next week.  This is further complicated by her anticipated nephrostomy 10 days from now related to bilateral pelvic kidney stones which require removal.  I will try to be up with Dr. Karsten Ro to see whether that can be deferred for about 3 weeks.  She was also noted to have pleural effusions in the hospital.  Pulmonary consultation has been arranged.  Chest Xray personally reviewed no costophrenic angle blunting was noted.  I do not think it has an impact currently on her symptoms.  I have spoken with Dr. Lovena Neighbours.  He is reviewed the images.  There is concern about hydronephrosis right side that is quite notable.  Hence, he thinks that deferring the urgent cardioversion probably is better with the plan to proceed with nephrostomy as scheduled on 1/4.  We could then resume anticoagulation as soon as allowed and undertake cardioversion a week of 1/11.   The second nephrostomy is typically delayed about 4 weeks so that would not be  delayed.   

## 2017-09-19 NOTE — Pre-Procedure Instructions (Signed)
ICD orders 09/10/17 on chart The following are in epic: EKG 09/17/17 ECHO 09/17/17 Last office visit note Dr. Caryl Comes 09/19/17

## 2017-09-19 NOTE — Patient Instructions (Addendum)
Your procedure is scheduled on:  Monday, Jan. 7, 2019   Surgery Time:  7:30AM-10:30AM   Report to [facility] Main  Entrance   Follow signs to Short Stay on first floor at 530 AM   Call this number if you have problems the morning of surgery [phone]    Do not eat food or drink liquids :After Midnight.   Do NOT smoke after Midnight   Take these medicines the morning of surgery with A SIP OF WATER: Amiodarone, Levothyroxine   Use eye drops per normal routine                               You may not have any metal on your body including hair pins, jewelry, and body piercings             Do not wear make-up, lotions, powders, perfumes,or deodorant             Do not wear nail polish.  Do not shave  48 hours prior to surgery.                 Do not bring valuables to the hospital. [place].   Contacts, dentures or bridgework may not be worn into surgery.   Leave suitcase in the car. After surgery it may be brought to your room.   Special Instructions:     Bring a copy of your healthcare power of attorney and living will documents         the day of surgery if you haven't scanned them in before.              Please read over the following fact sheets you were given:  [facility] - Preparing for Surgery Before surgery, you can play an important role.  Because skin is not sterile, your skin needs to be as free of germs as possible.  You can reduce the number of germs on your skin by washing with CHG (chlorahexidine gluconate) soap before surgery.  CHG is an antiseptic cleaner which kills germs and bonds with the skin to continue killing germs even after washing. Please DO NOT use if you have an allergy to CHG or antibacterial soaps.  If your skin becomes reddened/irritated stop using the CHG and inform your nurse when you arrive at Short Stay. Do not shave (including legs and underarms) for at least 48 hours prior to  the first CHG shower.  You may shave your face/neck.  Please follow these instructions carefully:  1.  Shower with CHG Soap the night before surgery and the  morning of surgery.  2.  If you choose to wash your hair, wash your hair first as usual with your normal  shampoo.  3.  After you shampoo, rinse your hair and body thoroughly to remove the shampoo.                             4.  Use CHG as you would any other liquid soap.  You can apply chg directly to the skin and wash.  Gently with a scrungie or clean washcloth.  5.  Apply the CHG Soap to your body ONLY FROM THE NECK DOWN.   Do   not use on face/  open                           Wound or open sores. Avoid contact with eyes, ears mouth and   genitals (private parts).                       Wash face,  Genitals (private parts) with your normal soap.             6.  Wash thoroughly, paying special attention to the area where your    surgery  will be performed.  7.  Thoroughly rinse your body with warm water from the neck down.  8.  DO NOT shower/wash with your normal soap after using and rinsing off the CHG Soap.                9.  Pat yourself dry with a clean towel.            10.  Wear clean pajamas.            11.  Place clean sheets on your bed the night of your first shower and do not  sleep with pets. Day of Surgery : Do not apply any lotions/deodorants the morning of surgery.  Please wear clean clothes to the hospital/surgery center.  FAILURE TO FOLLOW THESE INSTRUCTIONS MAY RESULT IN THE CANCELLATION OF YOUR SURGERY  PATIENT SIGNATURE_________________________________  NURSE SIGNATURE__________________________________  ________________________________________________________________________

## 2017-09-19 NOTE — Pre-Procedure Instructions (Signed)
CBC and BMP in epic 09/17/17

## 2017-09-19 NOTE — Patient Instructions (Addendum)
Medication Instructions: Your physician has recommended you make the following change in your medication:  -1) CONTINUE AMIODARONE - Take 2 tablets (400 mg) twice daily for 1 week, THEN take 2 tablets (400 mg) daily for 2 weeks, then 1 tablet (200 mg) daily - RX SENT to [facility] and [facility]  Labwork: None Ordered  Procedures/Testing: Your physician has requested that you have a TEE/Cardioversion. During a TEE, sound waves are used to create images of your heart. It provides your doctor with information about the size and shape of your heart and how well your heart's chambers and valves are working. In this test, a transducer is attached to the end of a flexible tube that is guided down you throat and into your esophagus (the tube leading from your mouth to your stomach) to get a more detailed image of your heart. Once the TEE has determined that a blood clot is not present, the cardioversion begins. Electrical Cardioversion uses a jolt of electricity to your heart either through paddles or wired patches attached to your chest. This is a controlled, usually prescheduled, procedure. This procedure is done at the hospital and you are not awake during the procedure. You usually go home the day of the procedure. Please see the instruction sheet given to you today for more information.   Follow-Up: Your physician recommends that you schedule a follow-up appointment in 3 MONTHS with Dr. Caryl Comes.  Remote monitoring is used to monitor your Pacemaker from home. This monitoring reduces the number of office visits required to check your device to one time per year. It allows [place] to keep an eye on the functioning of your device to ensure it is working properly. You are scheduled for a device check from home on 12/22/17. You may send your transmission at any time that day. If you have a wireless device, the transmission will be sent automatically. After your physician reviews your transmission, you will receive a postcard  with your next transmission date.     Any Additional Special Instructions Will Be Listed Below (If Applicable).  - Please arrive at [facility] at 8:30 am on Monday September 22, 2017 - Proceed to Admitting which will be found on your left hand side - You may take all your regular morning medications with enough water to get them down safely - Please have nothing to eat or drink after midnight the night before your procedure   If you need a refill on your cardiac medications before your next appointment, please call your pharmacy.

## 2017-09-19 NOTE — Telephone Encounter (Signed)
Follow Up:    Conie from Dr Simone Curia office,calling to check on the status of pt clearance. Please fax to [phone] [id number].

## 2017-09-19 NOTE — Telephone Encounter (Signed)
Note faxed 11:45 am 09/19/18

## 2017-09-22 ENCOUNTER — Encounter (HOSPITAL_COMMUNITY)
Admission: RE | Disposition: A | Discharge status: Home / Self Care | Payer: Self-pay | Source: Ambulatory Visit | Attending: Cardiovascular Disease

## 2017-09-22 HISTORY — PX: TEE WITH CARDIOVERSION: SHX5442

## 2017-09-22 SURGERY — CARDIOVERSION (CATH LAB)
Anesthesia: LOCAL

## 2017-09-24 ENCOUNTER — Encounter: Payer: Self-pay | Admitting: Internal Medicine

## 2017-09-24 ENCOUNTER — Other Ambulatory Visit: Payer: Self-pay | Admitting: Radiology

## 2017-09-24 ENCOUNTER — Encounter (HOSPITAL_COMMUNITY): Payer: Self-pay

## 2017-09-24 ENCOUNTER — Encounter (HOSPITAL_COMMUNITY)
Admission: RE | Admit: 2017-09-24 | Discharge: 2017-09-24 | Disposition: A | Discharge status: Home / Self Care | Payer: Medicare Other | Source: Ambulatory Visit | Attending: Urology | Admitting: Urology

## 2017-09-24 ENCOUNTER — Telehealth: Payer: Self-pay | Admitting: Internal Medicine

## 2017-09-24 ENCOUNTER — Other Ambulatory Visit: Payer: Self-pay

## 2017-09-24 DIAGNOSIS — Z8572 Personal history of non-Hodgkin lymphomas: Secondary | ICD-10-CM | POA: Insufficient documentation

## 2017-09-24 DIAGNOSIS — E890 Postprocedural hypothyroidism: Secondary | ICD-10-CM | POA: Insufficient documentation

## 2017-09-24 DIAGNOSIS — N202 Calculus of kidney with calculus of ureter: Secondary | ICD-10-CM | POA: Diagnosis not present

## 2017-09-24 DIAGNOSIS — Z853 Personal history of malignant neoplasm of breast: Secondary | ICD-10-CM | POA: Diagnosis not present

## 2017-09-24 DIAGNOSIS — Z87442 Personal history of urinary calculi: Secondary | ICD-10-CM | POA: Insufficient documentation

## 2017-09-24 DIAGNOSIS — Z79890 Hormone replacement therapy (postmenopausal): Secondary | ICD-10-CM | POA: Insufficient documentation

## 2017-09-24 DIAGNOSIS — Z5309 Procedure and treatment not carried out because of other contraindication: Secondary | ICD-10-CM | POA: Diagnosis not present

## 2017-09-24 DIAGNOSIS — Z85850 Personal history of malignant neoplasm of thyroid: Secondary | ICD-10-CM | POA: Diagnosis not present

## 2017-09-24 DIAGNOSIS — Z79899 Other long term (current) drug therapy: Secondary | ICD-10-CM | POA: Insufficient documentation

## 2017-09-24 DIAGNOSIS — Z85118 Personal history of other malignant neoplasm of bronchus and lung: Secondary | ICD-10-CM | POA: Diagnosis not present

## 2017-09-24 DIAGNOSIS — Z6833 Body mass index (BMI) 33.0-33.9, adult: Secondary | ICD-10-CM | POA: Insufficient documentation

## 2017-09-24 DIAGNOSIS — Z96651 Presence of right artificial knee joint: Secondary | ICD-10-CM | POA: Diagnosis not present

## 2017-09-24 DIAGNOSIS — Z950 Presence of cardiac pacemaker: Secondary | ICD-10-CM | POA: Diagnosis not present

## 2017-09-24 DIAGNOSIS — Z923 Personal history of irradiation: Secondary | ICD-10-CM | POA: Insufficient documentation

## 2017-09-24 DIAGNOSIS — Z7901 Long term (current) use of anticoagulants: Secondary | ICD-10-CM | POA: Diagnosis not present

## 2017-09-24 DIAGNOSIS — Z8673 Personal history of transient ischemic attack (TIA), and cerebral infarction without residual deficits: Secondary | ICD-10-CM | POA: Diagnosis not present

## 2017-09-24 DIAGNOSIS — Z9884 Bariatric surgery status: Secondary | ICD-10-CM | POA: Insufficient documentation

## 2017-09-24 DIAGNOSIS — I481 Persistent atrial fibrillation: Secondary | ICD-10-CM | POA: Insufficient documentation

## 2017-09-24 HISTORY — DX: Pleural effusion, not elsewhere classified: J90

## 2017-09-24 HISTORY — DX: Compression of vein: I87.1

## 2017-09-24 HISTORY — DX: Non-Hodgkin lymphoma, unspecified, unspecified site: C85.90

## 2017-09-24 HISTORY — DX: Atrioventricular block, complete: I44.2

## 2017-09-24 HISTORY — DX: Presence of cardiac pacemaker: Z95.0

## 2017-09-24 HISTORY — DX: Personal history of other diseases of the digestive system: Z87.19

## 2017-09-24 HISTORY — DX: Dyspnea, unspecified: R06.00

## 2017-09-24 IMAGING — MR MR SHOULDER*R* W/O CM
4 of 5 series · 19 of 40 positions shown · non-contrast
Comparison: None.

CLINICAL DATA: Right shoulder pain extends to the upper arm.

EXAM:
MRI OF THE RIGHT SHOULDER WITHOUT CONTRAST
TECHNIQUE: Multiplanar, multisequence MR imaging of the shoulder was performed.
No intravenous contrast was administered.

[Series 3: T1 · sagittal · 4.0mm · 0.25mm/px · 3 of 22 slices shown]
[im 4/22]
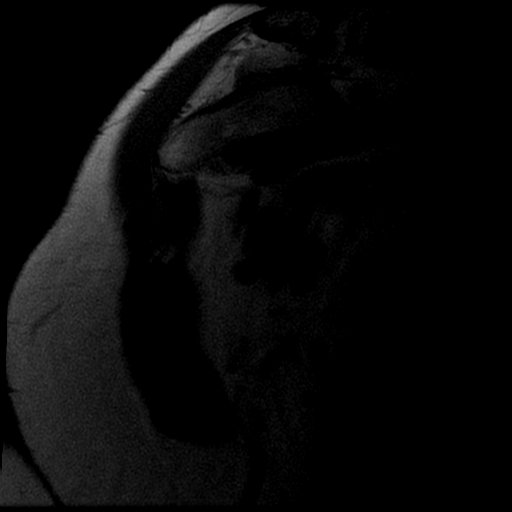
[im 11/22]
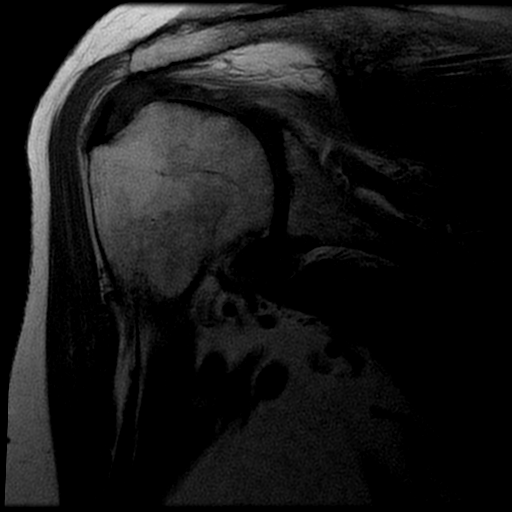
[im 18/22]
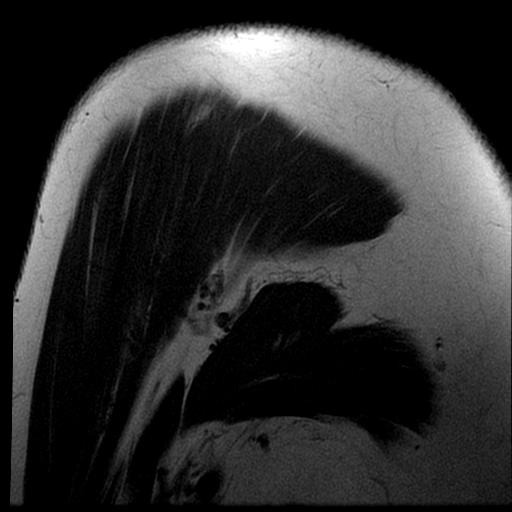

[Series 4: T2 fat-sat · sagittal · 4.0mm · 0.25mm/px · 5 of 22 slices shown (1 of 2)]
[im 1/22]
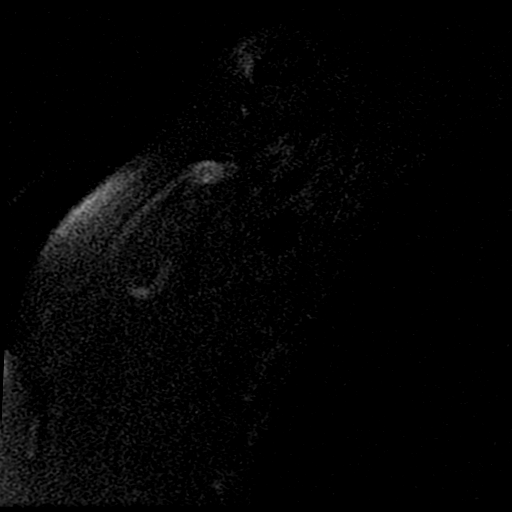
[im 4/22]
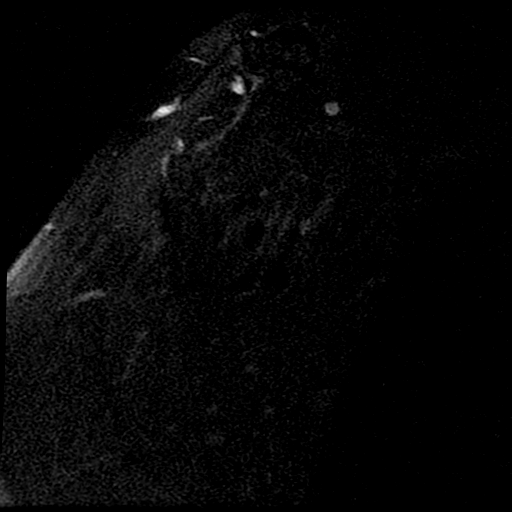
[im 7/22]
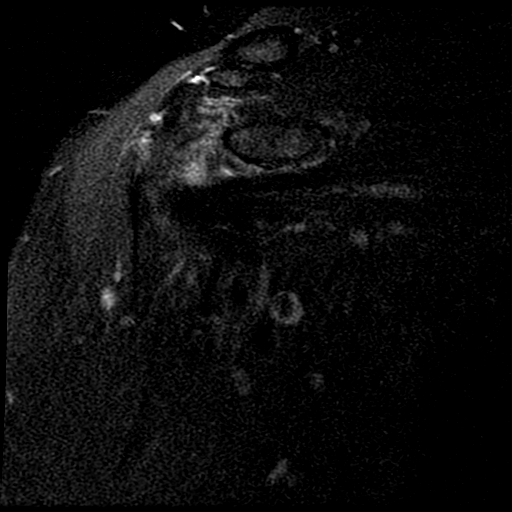
[im 13/22]
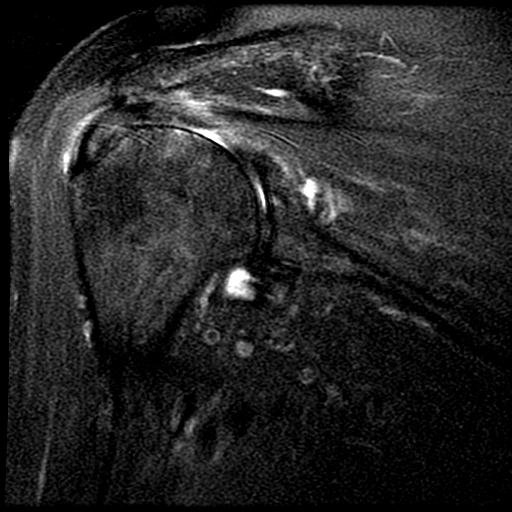
[im 19/22]
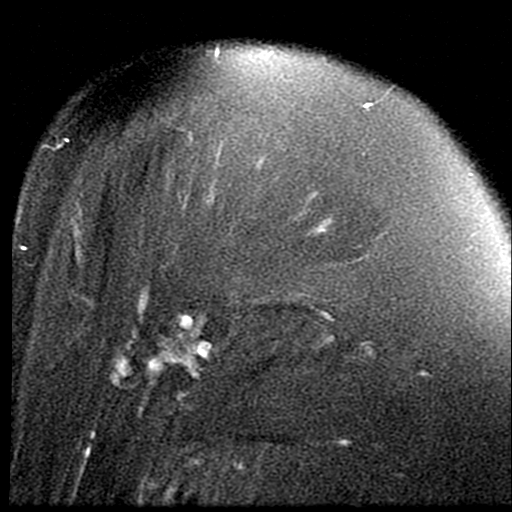

[Series 5: PD fat-sat · sagittal · 4.0mm · 0.25mm/px · 8 of 22 slices shown]
[im 1/22]
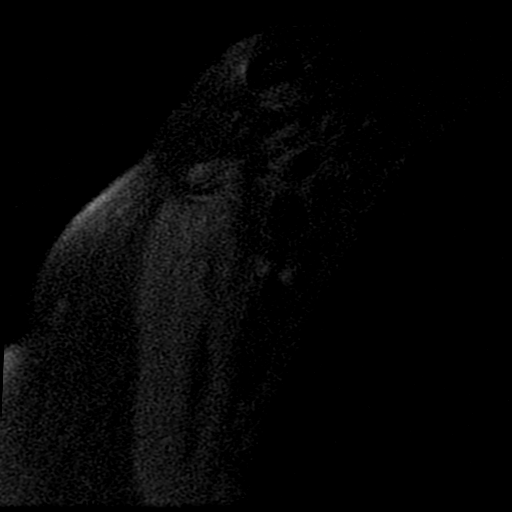
[im 4/22]
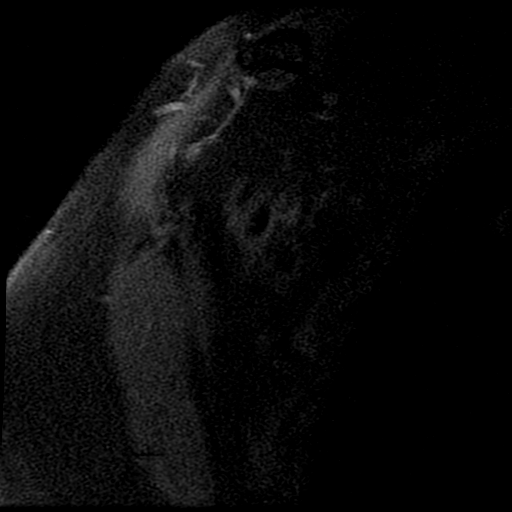
[im 7/22]
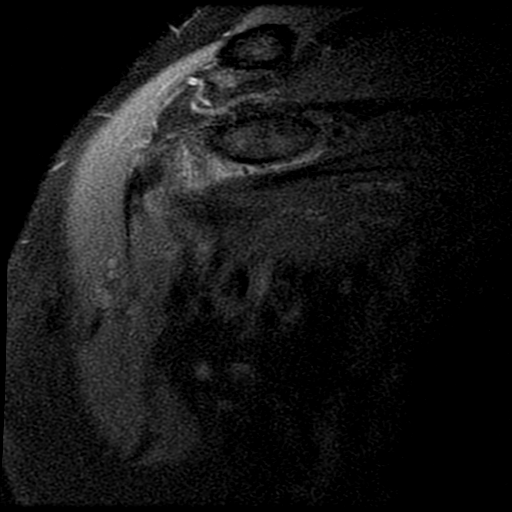
[im 10/22]
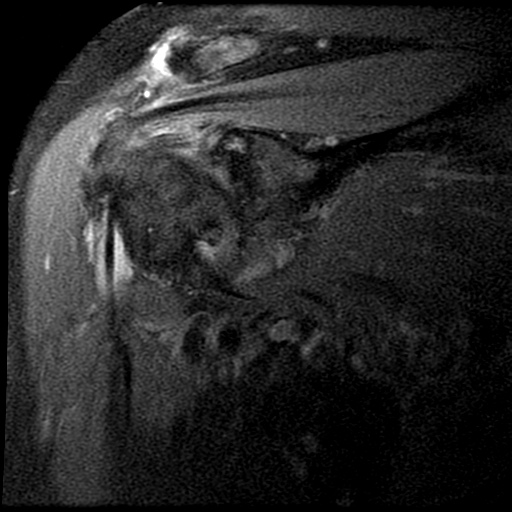
[im 13/22]
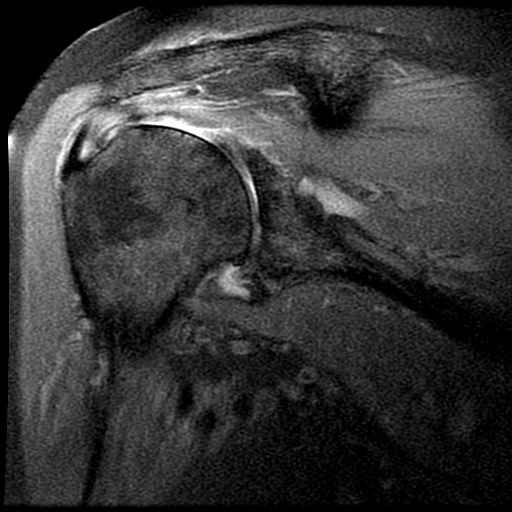
[im 16/22]
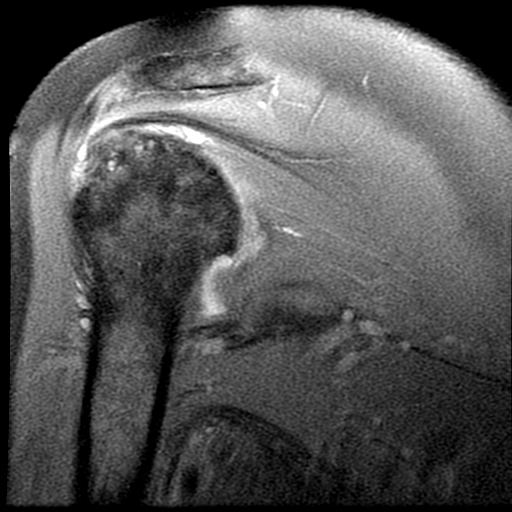
[im 19/22]
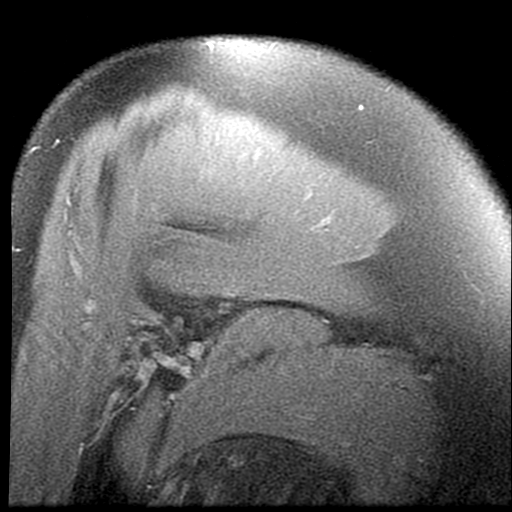
[im 22/22]
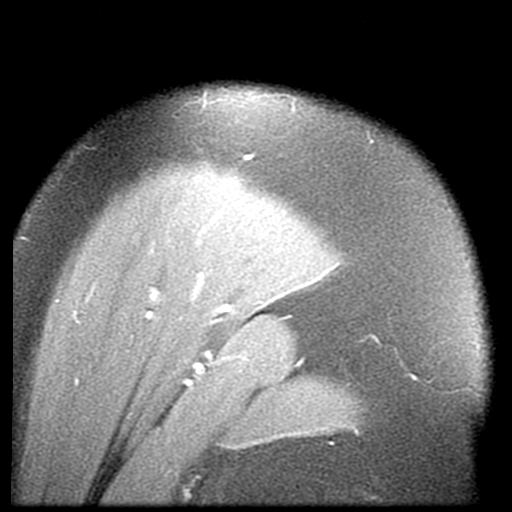

[Series 7: T2 fat-sat · axial · 4.0mm · 0.23mm/px · z∈[-3,+69]mm · 3 of 22 slices shown (2 of 2)]
[im 4/22]
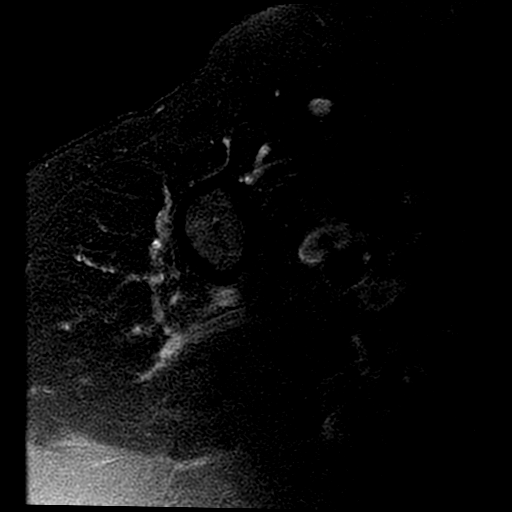
[im 13/22]
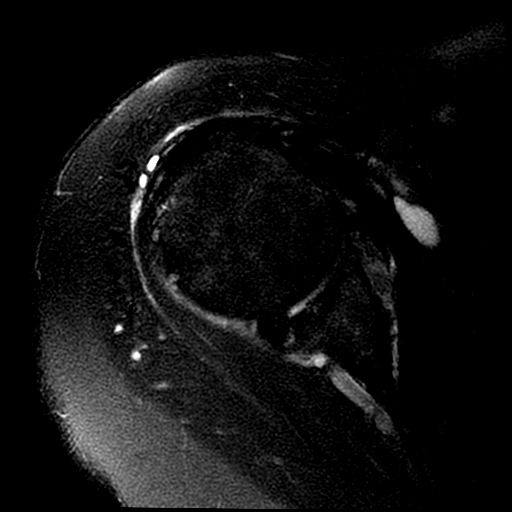
[im 19/22]
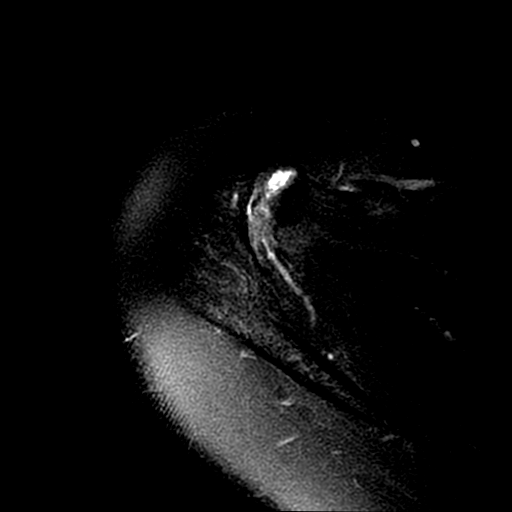

[19 of 40 positions shown; findings below may reference images not displayed]

FINDINGS: Rotator cuff: Severe tendinosis of the supraspinatus tendon with a
small insertional interstitial tear. Severe tendinosis of the
infraspinatus tendon with a small interstitial tear. Teres minor
tendon is intact. Mild tendinosis of the subscapularis tendon.

Muscles: No atrophy or fatty replacement of nor abnormal signal
within, the muscles of the rotator cuff.

Biceps long head: Mild tendinosis of the intraarticular portion of
the long head of the biceps tendon.

Acromioclavicular Joint: Moderate degenerative changes of the
acromioclavicular joint. No subacromial/ subdeltoid bursal fluid.
Type I acromion.

Glenohumeral Joint: Trace joint effusion with mild synovitis.
High-grade partial-thickness cartilage loss with areas of
full-thickness cartilage loss involving the glenohumeral joint with
mild subchondral reactive marrow changes in the glenoid.

Labrum: Limited evaluation of the labrum secondary to lack of
intra-articular contrast. Diffuse labral degeneration.

Bones: No acute fracture or dislocation. No aggressive osseous
lesion.
IMPRESSION: 1. Severe tendinosis of the supraspinatus tendon with a small
insertional interstitial tear.
2. Severe tendinosis of the infraspinatus tendon with a small
interstitial tear.
3. Mild tendinosis of the subscapularis tendon.
4. Mild tendinosis of the intraarticular portion of the long head of
the biceps tendon.
5. Moderate-severe osteoarthritis of the right glenohumeral joint.

## 2017-09-24 NOTE — Progress Notes (Signed)
Call patient.  Had received no instruction from IR regarding holding anticoagulation for her nephrostomy.  Suggested he she take her dose tonight.  As relates to planned stone extraction on Monday, suggested that she stop taking her Eliquis Sunday p.m.  If she needs to find out from IR as to whether she can resume her anticoagulation between Friday and Sunday morning.  Also reviewed with her the explanation as to her TEE cardioversion scheduled for 1/11

## 2017-09-24 NOTE — Telephone Encounter (Signed)
Kayla Harrison is calling to find out when so she stop the Eliquis for her Surgery on Friday . Please call

## 2017-09-25 NOTE — Pre-Procedure Instructions (Signed)
Surgical clearance note on chart 09/19/17

## 2017-09-26 ENCOUNTER — Ambulatory Visit (HOSPITAL_COMMUNITY)
Admission: RE | Admit: 2017-09-26 | Discharge: 2017-09-26 | Disposition: A | Discharge status: Home / Self Care | Payer: Medicare Other | Source: Ambulatory Visit | Attending: Urology | Admitting: Urology

## 2017-09-26 ENCOUNTER — Encounter (HOSPITAL_COMMUNITY): Payer: Self-pay

## 2017-09-26 DIAGNOSIS — I481 Persistent atrial fibrillation: Secondary | ICD-10-CM | POA: Diagnosis not present

## 2017-09-26 DIAGNOSIS — N201 Calculus of ureter: Secondary | ICD-10-CM

## 2017-09-26 DIAGNOSIS — N202 Calculus of kidney with calculus of ureter: Secondary | ICD-10-CM | POA: Diagnosis not present

## 2017-09-26 DIAGNOSIS — Z5309 Procedure and treatment not carried out because of other contraindication: Secondary | ICD-10-CM | POA: Diagnosis not present

## 2017-09-26 DIAGNOSIS — N200 Calculus of kidney: Secondary | ICD-10-CM | POA: Diagnosis not present

## 2017-09-26 DIAGNOSIS — Z87442 Personal history of urinary calculi: Secondary | ICD-10-CM | POA: Diagnosis not present

## 2017-09-26 DIAGNOSIS — E890 Postprocedural hypothyroidism: Secondary | ICD-10-CM | POA: Diagnosis not present

## 2017-09-26 LAB — CBC WITH DIFFERENTIAL/PLATELET
Basophils Absolute: 0 10*3/uL (ref 0.0–0.1)
Basophils Relative: 1 %
EOS ABS: 0.1 10*3/uL (ref 0.0–0.7)
EOS PCT: 4 %
HCT: 36.2 % (ref 36.0–46.0)
HEMOGLOBIN: 11.3 g/dL — AB (ref 12.0–15.0)
LYMPHS ABS: 0.6 10*3/uL — AB (ref 0.7–4.0)
LYMPHS PCT: 18 %
MCH: 30.5 pg (ref 26.0–34.0)
MCHC: 31.2 g/dL (ref 30.0–36.0)
MCV: 97.6 fL (ref 78.0–100.0)
MONOS PCT: 9 %
Monocytes Absolute: 0.3 10*3/uL (ref 0.1–1.0)
Neutro Abs: 2.4 10*3/uL (ref 1.7–7.7)
Neutrophils Relative %: 68 %
PLATELETS: 148 10*3/uL — AB (ref 150–400)
RBC: 3.71 MIL/uL — AB (ref 3.87–5.11)
RDW: 15 % (ref 11.5–15.5)
WBC: 3.4 10*3/uL — AB (ref 4.0–10.5)

## 2017-09-26 LAB — BASIC METABOLIC PANEL
Anion gap: 4 — ABNORMAL LOW (ref 5–15)
BUN: 16 mg/dL (ref 6–20)
CHLORIDE: 109 mmol/L (ref 101–111)
CO2: 25 mmol/L (ref 22–32)
CREATININE: 0.91 mg/dL (ref 0.44–1.00)
Calcium: 8.9 mg/dL (ref 8.9–10.3)
GFR calc Af Amer: >60 mL/min (ref >60)
GFR calc non Af Amer: >60 mL/min (ref >60)
Glucose, Bld: 94 mg/dL (ref 65–99)
POTASSIUM: 4.1 mmol/L (ref 3.5–5.1)
Sodium: 138 mmol/L (ref 135–145)

## 2017-09-26 LAB — PROTIME-INR
INR: 1.18
PROTHROMBIN TIME: 14.9 s (ref 11.4–15.2)

## 2017-09-26 MED ORDER — HEPARIN SOD (PORK) LOCK FLUSH 100 UNIT/ML IV SOLN
INTRAVENOUS | Status: DC | PRN
  Administered 2017-09-26: 500 [IU]

## 2017-09-26 MED ORDER — SODIUM CHLORIDE 0.9 % IV SOLN
INTRAVENOUS | Status: DC
  Administered 2017-09-26: 13:00:00 via INTRAVENOUS

## 2017-09-26 MED ORDER — HEPARIN SOD (PORK) LOCK FLUSH 100 UNIT/ML IV SOLN
INTRAVENOUS | Status: AC
  Filled 2017-09-26: qty 5

## 2017-09-26 MED ORDER — HEPARIN SOD (PORK) LOCK FLUSH 100 UNIT/ML IV SOLN: 500.0000 [IU] | INTRAVENOUS | Status: DC | PRN

## 2017-09-26 MED ORDER — CEFAZOLIN SODIUM-DEXTROSE 2-4 GM/100ML-% IV SOLN: 2.0000 g | INTRAVENOUS | Status: DC

## 2017-09-26 NOTE — H&P (Signed)
Referring Physician(s): Kathie Rhodes  Supervising Physician: Aletta Edouard  Patient Status:  [facility]  Chief Complaint:  Right renal/ureteral stones, flank pain  Subjective: Patient familiar to IR service from prior thoracenteses in 2014, bone marrow biopsy in June 2018 as well as Port-A-Cath placement on 02/24/17.  She has a prior history of non-Hodgkin's lymphoma.  She also has a history of right ureteral stone with prior double-J stent placement in June of last year, since removed.  She presents today for right percutaneous nephrostomy prior to nephrolithotomy on 09/29/17.  She is also on Eliquis for atrial fibrillation with last dose yesterday.  Additional medical history as listed below. She currently denies fever, headache, chest pain, abdominal pain, nausea, vomiting or significant bleeding.  She does have some dyspnea, occasional cough, right flank pain. Past Medical History:  Diagnosis Date  . Anemia   . Arthritis    osteoarthritis - knees , history of  . Atrial fibrillation [facility])    ablation- 2x's-- 1st time- [facility], 2nd event at [facility] in 2008. Convergent ablation at [facility] 6/14  . Atrial fibrillation ([employer])   . Blood transfusion without reported diagnosis   . Breast cancer ([place])    Dr Margot Chimes, total thyroidectomy- 1999- for cancer  . Chronic bilateral pleural effusions   . Colon polyp    Dr Earlean Shawl  . Complete heart block ([place])   . Complication of anesthesia    Ketamine produces LSD reaction, bright colored nightmarish experience   . Dyslipidemia   . Dyspnea   . H/O pleural effusion    s/p thoracentesis w 3246m withdrawn  . Hepatitis    [place]patitis   . History of dysphagia    due to radiation therapy  . History of hiatal hernia    small noted on PET scan  . History of kidney stones   . Hx of thyroid cancer    Dr SForde Dandy . Hyperlipidemia   . Hypothyroidism   . [place]   radiation RX  completed 03/04/17; will start chemo 6/27, pt unaware of lung cancer  . Morbid [place] ([place]    Status post lap band surgery  . [place] Hodgkin's lymphoma ([place]    on chemotherapy  . Persistent atrial fibrillation ([place]    a. s/p PVI 2008 b. s/p convergent ablation 23704complicated by bradycardia requiring pacemaker implant  . Presence of permanent cardiac pacemaker   . Sinus node dysfunction ([place]    Complicating convergent ablation 6/14  . St[place] ([facility]    [place]  . SVC syndrome    Past Surgical History:  Procedure Laterality Date  . ABDOMINAL HYSTERECTOMY  1983  . afib ablation     a. 2008 PVI b. 2014 convergent ablation  . APPENDECTOMY    . BONE MARROW BIOPSY  02/21/2017  . BREAST LUMPECTOMY  2010  . bso  1998  . CARDIOVERSION  10/09/2012   Procedure: CARDIOVERSION;  Surgeon: JMinus Breeding MD;  [place]  Service: Cardiovascular;  Laterality: N/A;  . CARDIOVERSION  10/09/2012   Procedure: CARDIOVERSION;  Surgeon: JMinus Breeding MD;  [facility];  Service: Cardiovascular;  Laterality: N/A;  [name] the ok to add pt to the add on , but we must check to find out if the can add pt on at [phone] ( [phone])  . CARDIOVERSION N/A 11/20/2012   Procedure: CARDIOVERSION;  Surgeon: PFay Records MD;  Location:  [place] ENDOSCOPY;  Service: Cardiovascular;  Laterality: N/A;  . CARDIOVERSION N/A 07/18/2017   Procedure: CARDIOVERSION;  Surgeon: Thayer Headings, MD;  Location: [place];  Service: Cardiovascular;  Laterality: N/A;  . CHOLECYSTECTOMY    . COLONOSCOPY W/ POLYPECTOMY     Dr Earlean Shawl  . CYSTOSCOPY N/A 02/06/2015   Procedure: CYSTOSCOPY;  Surgeon: Kathie Rhodes, MD;  [facility];  Service: Urology;  Laterality: N/A;  . CYSTOSCOPY WITH RETROGRADE PYELOGRAM, URETEROSCOPY AND STENT PLACEMENT Right 02/06/2015   Procedure: RETROGRADE PYELOGRAM, RIGHT URETEROSCOPY STENT PLACEMENT;  Surgeon: Kathie Rhodes, MD;  [facility];  Service: Urology;  Laterality: Right;    . CYSTOSCOPY WITH RETROGRADE PYELOGRAM, URETEROSCOPY AND STENT PLACEMENT Right 03/07/2017   Procedure: CYSTOSCOPY WITH RIGHT RETROGRADE PYELOGRAM,RIGHT  URETEROSCOPYLASER LITHOTRIPSY  AND STENT PLACEMENT AND STONE BASKETRY;  Surgeon: Kathie Rhodes, MD;  Location: [place];  Service: Urology;  Laterality: Right;  . EYE SURGERY     cataract surgery  . HOLMIUM LASER APPLICATION N/A 03/06/4314   Procedure: HOLMIUM LASER APPLICATION;  Surgeon: Kathie Rhodes, MD;  [facility];  Service: Urology;  Laterality: N/A;  . HOLMIUM LASER APPLICATION Right 4/00/8676   Procedure: HOLMIUM LASER APPLICATION;  Surgeon: Kathie Rhodes, MD;  Location: [facility];  Service: Urology;  Laterality: Right;  . IR FLUORO GUIDE PORT INSERTION RIGHT  02/24/2017  . IR PATIENT EVAL TECH 0-60 MINS  03/11/2017  . IR US GUIDE VASC ACCESS RIGHT  02/24/2017  . KNEE ARTHROSCOPY     bilateral  . LAPAROSCOPIC GASTRIC BANDING  07/10/2010  . LAPAROTOMY     for ruptured ovary and ovarian artery   . PACEMAKER INSERTION  03/10/2013   MDT dual chamber PPM  . POCKET REVISION N/A 12/08/2013   Procedure: POCKET REVISION;  Surgeon: Deboraha Sprang, MD;  Location: [facility] CATH LAB;  Service: Cardiovascular;  Laterality: N/A;  . PORTA CATH INSERTION    . TEE WITH CARDIOVERSION  09/22/2017  . THYROIDECTOMY  1998   Dr Margot Chimes  . TONSILLECTOMY    . TOTAL KNEE ARTHROPLASTY  04/13/2012   Procedure: TOTAL KNEE ARTHROPLASTY;  Surgeon: Rudean Haskell, MD;  Location: [place];  Service: Orthopedics;  Laterality: Right;  Marland Kitchen VIDEO BRONCHOSCOPY WITH ENDOBRONCHIAL ULTRASOUND N/A 02/07/2017   Procedure: VIDEO BRONCHOSCOPY WITH ENDOBRONCHIAL ULTRASOUND;  Surgeon: Marshell Garfinkel, MD;  Location: [place];  Service: Pulmonary;  Laterality: N/A;    Allergies: Tikosyn [dofetilide]; Xarelto [rivaroxaban]; Epinephrine; Ketamine; Oral anesthetic [benzocaine]; and Adhesive [tape]  Medications: Prior to Admission medications   Medication  Sig Start Date End Date Taking? Authorizing Provider  amiodarone (PACERONE) 200 MG tablet Take 1 tablet (200 mg total) by mouth daily. 09/19/17  Yes Deboraha Sprang, MD  amiodarone (PACERONE) 200 MG tablet Take 2 tablets (400 mg) twice daily for 1 week, THEN take 2 tablets (400 mg) daily for 2 weeks, then 1 tablet (200 mg) daily Patient taking differently: Take 200-400 mg by mouth See admin instructions. Take 2 tablets (400 mg) twice daily for 1 week, THEN take 2 tablets (400 mg) daily for 2 weeks, then 1 tablet (200 mg) daily 09/19/17  Yes Deboraha Sprang, MD  cycloSPORINE (RESTASIS) 0.05 % ophthalmic emulsion Place 1 drop into both eyes 2 (two) times daily.   Yes [provider]  diphenhydramine-acetaminophen (TYLENOL PM) 25-500 MG TABS tablet Take 2 tablets by mouth at bedtime.    Yes [provider]  ELIQUIS 5 MG TABS tablet TAKE 1  TABLET BY MOUTH TWO  TIMES DAILY 08/26/17  Yes Deboraha Sprang, MD  levothyroxine (SYNTHROID, LEVOTHROID) 137 MCG tablet Take 1 tablet (137 mcg total) by mouth daily before breakfast. 09/17/17  Yes Bonnielee Haff, MD  Lifitegrast Shirley Friar) 5 % SOLN Apply 1 drop to eye 2 (two) times daily.   Yes [provider]  Multiple Vitamin (MULTIVITAMIN) tablet Take 1 tablet by mouth daily.    Yes [provider]  oxyCODONE-acetaminophen (PERCOCET/ROXICET) 5-325 MG tablet Take 1 tablet by mouth daily as needed for severe pain.   Yes [provider]  furosemide (LASIX) 20 MG tablet Take 1 tablets daily till you see your cardiolgist Patient taking differently: Take 20 mg by mouth daily as needed for fluid or edema.  09/17/17   Bonnielee Haff, MD     Vital Signs: BP (!) 146/93   Pulse (!) 115   Temp (!) 97.4 F (36.3 C) (Oral)   Resp 16   SpO2 95%   Physical Exam awake, alert.  Chest clear to auscultation bilaterally.  Heart with irregularly irregular rhythm.  Right chest wall Port-A-Cath and left chest wall pacer intact; abdomen  soft, positive bowel sounds, nontender; mild right CVA tenderness palpation.  No significant lower extremity edema.  Imaging: No results found.  Labs:  CBC: Recent Labs    08/28/17 0946 09/15/17 0928 09/16/17 0420 09/17/17 0430  WBC 5.8 4.2 5.3 4.8  HGB 10.8* 10.2* 10.3* 10.0*  HCT 34.5* 32.6* 32.1* 31.4*  PLT 175 156 158 152    COAGS: Recent Labs    02/07/17 0317  04/18/17 1214 04/22/17 0944 04/28/17 1044 09/16/17 0420  INR 1.39   < > 5.1 1.9 2.2 1.46  APTT 40*  --   --   --   --   --    < > = values in this interval not displayed.    BMP: Recent Labs    09/15/17 0928 09/16/17 0420 09/16/17 1149 09/17/17 0430  NA 140 137 136 136  K 4.1 3.7 3.6 3.6  CL 109 106 105 104  CO2 _0 GLUCOSE 102* 101* 131* 139*  BUN _1 CALCIUM 8.8* 8.8* 8.9 8.8*  CREATININE 0.93 0.98 1.00 1.05*  GFRNONAA 60* 56* 55* 51*  GFRAA >60 >60 >60 60*    LIVER FUNCTION TESTS: Recent Labs    07/29/17 1414 08/28/17 0946 09/15/17 0928 09/16/17 0420  BILITOT 0.43 0.89 1.2 1.1  AST _2 ALT _3 ALKPHOS 81 71 62 67  PROT 5.9* 6.1* 5.5* 5.4*  ALBUMIN 2.6* 3.3* 3.0* 3.0*    Assessment and Plan:  Pt with prior history of non-Hodgkin's lymphoma.  She also has a history of right flank pain, right ureteral stone with prior double-J stent placement in June of last year, since removed.  She presents today for right percutaneous nephrostomy prior to nephrolithotomy on 09/29/17.  She is also on Eliquis for atrial fibrillation with last dose yesterday. Risks and benefits of nephrostomy were discussed with the patient including, but not limited to, infection, bleeding, significant bleeding causing loss or decrease in renal function or damage to adjacent structures.   All of the patient's questions were answered, patient is agreeable to proceed.  Consent signed and in chart.  Following d/w Dr. Kathlene Cote, decision made to postpone procedure until eliquis hold is  for a 48 hr period instead of 24 hr and can be coordinated together with planned  nephrolithotomy. Pt aware of above. She will resume her eliquis today and await call from urology regarding a date to reschedule procedures. She wishes to maintain her port a cath as well until remaining invasive procedures are completed.    Electronically Signed: D. Rowe Robert, PA-C 09/26/2017, 11:53 AM   I spent a total of 25 minutes at the the patient's bedside AND on the patient's hospital floor or unit, greater than 50% of which was counseling/coordinating care for right percutaneous nephrostomy

## 2017-09-29 ENCOUNTER — Encounter (HOSPITAL_COMMUNITY): Admission: RE | Payer: Self-pay | Source: Ambulatory Visit

## 2017-09-29 ENCOUNTER — Ambulatory Visit (HOSPITAL_COMMUNITY): Admission: RE | Admit: 2017-09-29 | Payer: Medicare Other | Source: Ambulatory Visit | Admitting: Urology

## 2017-09-29 SURGERY — NEPHROLITHOTOMY PERCUTANEOUS
Anesthesia: General | Laterality: Right

## 2017-10-01 ENCOUNTER — Telehealth: Payer: Self-pay | Admitting: Internal Medicine

## 2017-10-01 NOTE — Telephone Encounter (Signed)
New Message  Patient states that she is to have a procedure done at the hospital on 10/03/2017 cardio version. Not showing anything scheduled please call.

## 2017-10-01 NOTE — Telephone Encounter (Signed)
I spoke with pt. She states she cannot see the cardioversion portion of her procedure scheduled in my chart.  I told pt she is scheduled for TEE/Cardioversion on 10/03/17 and she should arrive at the hospital at 8:30 that morning.  Pt confirms she is taking Eliquis and is aware to continue.  I told her she should not have anything to eat or drink after midnight prior to procedure.  I told her to take medications the morning of the procedure with small sips of water.

## 2017-10-03 ENCOUNTER — Ambulatory Visit (HOSPITAL_BASED_OUTPATIENT_CLINIC_OR_DEPARTMENT_OTHER): Payer: Medicare Other

## 2017-10-03 ENCOUNTER — Encounter (HOSPITAL_COMMUNITY): Payer: Self-pay | Admitting: Certified Registered Nurse Anesthetist

## 2017-10-03 ENCOUNTER — Ambulatory Visit (HOSPITAL_COMMUNITY): Payer: Medicare Other | Admitting: Certified Registered Nurse Anesthetist

## 2017-10-03 ENCOUNTER — Encounter (HOSPITAL_COMMUNITY)
Admission: RE | Disposition: A | Discharge status: Home / Self Care | Payer: Self-pay | Source: Ambulatory Visit | Attending: Cardiovascular Disease

## 2017-10-03 ENCOUNTER — Other Ambulatory Visit: Payer: Self-pay

## 2017-10-03 ENCOUNTER — Ambulatory Visit (HOSPITAL_COMMUNITY)
Admission: RE | Admit: 2017-10-03 | Discharge: 2017-10-03 | Disposition: A | Discharge status: Home / Self Care | Payer: Medicare Other | Source: Ambulatory Visit | Attending: Cardiovascular Disease | Admitting: Cardiovascular Disease

## 2017-10-03 DIAGNOSIS — Z950 Presence of cardiac pacemaker: Secondary | ICD-10-CM | POA: Diagnosis not present

## 2017-10-03 DIAGNOSIS — I4892 Unspecified atrial flutter: Secondary | ICD-10-CM | POA: Diagnosis not present

## 2017-10-03 DIAGNOSIS — Z85118 Personal history of other malignant neoplasm of bronchus and lung: Secondary | ICD-10-CM | POA: Diagnosis not present

## 2017-10-03 DIAGNOSIS — I481 Persistent atrial fibrillation: Secondary | ICD-10-CM | POA: Diagnosis not present

## 2017-10-03 DIAGNOSIS — G4733 Obstructive sleep apnea (adult) (pediatric): Secondary | ICD-10-CM | POA: Diagnosis not present

## 2017-10-03 DIAGNOSIS — Z9989 Dependence on other enabling machines and devices: Secondary | ICD-10-CM | POA: Diagnosis not present

## 2017-10-03 DIAGNOSIS — Z7901 Long term (current) use of anticoagulants: Secondary | ICD-10-CM | POA: Insufficient documentation

## 2017-10-03 DIAGNOSIS — I484 Atypical atrial flutter: Secondary | ICD-10-CM | POA: Diagnosis not present

## 2017-10-03 DIAGNOSIS — Z9221 Personal history of antineoplastic chemotherapy: Secondary | ICD-10-CM | POA: Insufficient documentation

## 2017-10-03 DIAGNOSIS — I4891 Unspecified atrial fibrillation: Secondary | ICD-10-CM | POA: Diagnosis not present

## 2017-10-03 DIAGNOSIS — Z853 Personal history of malignant neoplasm of breast: Secondary | ICD-10-CM | POA: Insufficient documentation

## 2017-10-03 DIAGNOSIS — R918 Other nonspecific abnormal finding of lung field: Secondary | ICD-10-CM | POA: Diagnosis not present

## 2017-10-03 DIAGNOSIS — Z96651 Presence of right artificial knee joint: Secondary | ICD-10-CM | POA: Insufficient documentation

## 2017-10-03 DIAGNOSIS — I361 Nonrheumatic tricuspid (valve) insufficiency: Secondary | ICD-10-CM

## 2017-10-03 DIAGNOSIS — Z8572 Personal history of non-Hodgkin lymphomas: Secondary | ICD-10-CM | POA: Diagnosis not present

## 2017-10-03 DIAGNOSIS — I442 Atrioventricular block, complete: Secondary | ICD-10-CM | POA: Insufficient documentation

## 2017-10-03 DIAGNOSIS — N200 Calculus of kidney: Secondary | ICD-10-CM | POA: Diagnosis not present

## 2017-10-03 DIAGNOSIS — Z85850 Personal history of malignant neoplasm of thyroid: Secondary | ICD-10-CM | POA: Insufficient documentation

## 2017-10-03 DIAGNOSIS — Z9884 Bariatric surgery status: Secondary | ICD-10-CM | POA: Diagnosis not present

## 2017-10-03 DIAGNOSIS — Z8673 Personal history of transient ischemic attack (TIA), and cerebral infarction without residual deficits: Secondary | ICD-10-CM | POA: Diagnosis not present

## 2017-10-03 DIAGNOSIS — Z923 Personal history of irradiation: Secondary | ICD-10-CM | POA: Diagnosis not present

## 2017-10-03 DIAGNOSIS — E890 Postprocedural hypothyroidism: Secondary | ICD-10-CM | POA: Insufficient documentation

## 2017-10-03 HISTORY — PX: CARDIOVERSION: SHX1299

## 2017-10-03 HISTORY — PX: TEE WITHOUT CARDIOVERSION: SHX5443

## 2017-10-03 SURGERY — ECHOCARDIOGRAM, TRANSESOPHAGEAL
Anesthesia: Monitor Anesthesia Care

## 2017-10-03 MED ORDER — SODIUM CHLORIDE 0.9 % IV SOLN
INTRAVENOUS | Status: DC
  Administered 2017-10-03: 10:00:00 via INTRAVENOUS

## 2017-10-03 MED ORDER — PROPOFOL 500 MG/50ML IV EMUL
INTRAVENOUS | Status: DC | PRN
  Administered 2017-10-03: 100 ug/kg/min via INTRAVENOUS

## 2017-10-03 MED ORDER — LIDOCAINE HCL (CARDIAC) 20 MG/ML IV SOLN
INTRAVENOUS | Status: DC | PRN
  Administered 2017-10-03: 80 mg via INTRAVENOUS

## 2017-10-03 MED ORDER — PROPOFOL 10 MG/ML IV BOLUS
INTRAVENOUS | Status: DC | PRN
  Administered 2017-10-03: 30 mg via INTRAVENOUS
  Administered 2017-10-03: 20 mg via INTRAVENOUS
  Administered 2017-10-03: 30 mg via INTRAVENOUS

## 2017-10-03 MED ORDER — HEPARIN SOD (PORK) LOCK FLUSH 100 UNIT/ML IV SOLN
500.0000 [IU] | Freq: Once | INTRAVENOUS | Status: AC
  Administered 2017-10-03: 500 [IU] via INTRAVENOUS

## 2017-10-03 NOTE — Interval H&P Note (Signed)
History and Physical Interval Note:  10/03/2017 8:46 AM  [name]  has presented today for surgery, with the diagnosis of atrial fibrillation  The various methods of treatment have been discussed with the patient and family. After consideration of risks, benefits and other options for treatment, the patient has consented to  Procedure(s): TRANSESOPHAGEAL ECHOCARDIOGRAM (TEE) (N/A) CARDIOVERSION (N/A) as a surgical intervention .  The patient's history has been reviewed, patient examined, no change in status, stable for surgery.  I have reviewed the patient's chart and labs.  Questions were answered to the patient's satisfaction.     Kayla Harrison

## 2017-10-03 NOTE — Anesthesia Preprocedure Evaluation (Signed)
Anesthesia Evaluation  Patient identified by MRN, date of birth, ID band Patient awake    Reviewed: Allergy & Precautions, NPO status , Patient's Chart, lab work & pertinent test results  Airway Mallampati: II  TM Distance: >3 FB Neck ROM: Full    Dental  (+) Teeth Intact, Dental Advisory Given   Pulmonary    breath sounds clear to auscultation       Cardiovascular  Rhythm:Irregular Rate:Normal     Neuro/Psych    GI/Hepatic   Endo/Other    Renal/GU      Musculoskeletal   Abdominal   Peds  Hematology   Anesthesia Other Findings   Reproductive/Obstetrics                             Anesthesia Physical Anesthesia Plan  ASA: III  Anesthesia Plan: MAC and General   Post-op Pain Management:    Induction: Intravenous  PONV Risk Score and Plan: Dexamethasone and Ondansetron  Airway Management Planned: Natural Airway and Simple Face Mask  Additional Equipment:   Intra-op Plan:   Post-operative Plan: Extubation in OR  Informed Consent:   Dental advisory given  Plan Discussed with: CRNA and Anesthesiologist  Anesthesia Plan Comments:         Anesthesia Quick Evaluation

## 2017-10-03 NOTE — Op Note (Signed)
INDICATIONS: atrial flutter precardioversion  PROCEDURE:   Informed consent was obtained prior to the procedure. The risks, benefits and alternatives for the procedure were discussed and the patient comprehended these risks.  Risks include, but are not limited to, cough, sore throat, vomiting, nausea, somnolence, esophageal and stomach trauma or perforation, bleeding, low blood pressure, aspiration, pneumonia, infection, trauma to the teeth and death.    After a procedural time-out, the oropharynx was anesthetized with 20% benzocaine spray.   During this procedure the patient was administered IV propofol by Anesthesiology.  The transesophageal probe was inserted in the esophagus and stomach without difficulty and multiple views were obtained.  The patient was kept under observation until the patient left the procedure room.  The patient left the procedure room in stable condition.   Agitated microbubble saline contrast was not administered.  COMPLICATIONS:    There were no immediate complications.  FINDINGS:  No left atrial thrombus. The appendage is diminutive. Mild PAH, 2+TR.  RECOMMENDATIONS:     Proceed with cardioversion.  Time Spent Directly with the Patient:  30 minutes   Zell Hylton 10/03/2017, 10:42 AM

## 2017-10-03 NOTE — Op Note (Signed)
Procedure: Electrical Cardioversion Indications:  Atrial Flutter  Procedure Details:  Consent: Risks of procedure as well as the alternatives and risks of each were explained to the (patient/caregiver).  Consent for procedure obtained.  Time Out: Verified patient identification, verified procedure, site/side was marked, verified correct patient position, special equipment/implants available, medications/allergies/relevent history reviewed, required imaging and test results available.  Performed  Patient placed on cardiac monitor, pulse oximetry, supplemental oxygen as necessary.  Sedation given: IV propofol, Anesthesiology Pacer pads placed anterior and posterior chest.  Cardioverted 1 time(s).  Cardioversion with synchronized biphasic 120J shock.  Evaluation: Findings: Post procedure EKG shows: sinus rhythm Complications: None Patient did tolerate procedure well.  Device check pre and post cardioversion shows normal device function. Programming left DDIR due to problems with failure to mode switch due to atrial blanking in flutter. Unable to activate PMOP or APP in DDIR.  Time Spent Directly with the Patient:  30 minutes   Kayla Harrison 10/03/2017, 10:46 AM

## 2017-10-03 NOTE — Transfer of Care (Signed)
Immediate Anesthesia Transfer of Care Note  Patient: Kayla Harrison  Procedure(s) Performed: TRANSESOPHAGEAL ECHOCARDIOGRAM (TEE) (N/A ) CARDIOVERSION (N/A )  Patient Location: Endoscopy Unit  Anesthesia Type:MAC  Level of Consciousness: awake, alert  and oriented  Airway & Oxygen Therapy: Patient Spontanous Breathing and Patient connected to nasal cannula oxygen  Post-op Assessment: Report given to RN and Post -op Vital signs reviewed and stable  Post vital signs: Reviewed and stable  Last Vitals:  Vitals:   10/03/17 0910  BP: 138/77  Pulse: (!) 114  Resp: 17  Temp: 36.6 C  SpO2: 97%    Last Pain:  Vitals:   10/03/17 0910  TempSrc: Oral         Complications: No apparent anesthesia complications

## 2017-10-03 NOTE — Progress Notes (Signed)
  Echocardiogram Echocardiogram Transesophageal has been performed.  Merrie Roof F 10/03/2017, 1:16 PM

## 2017-10-03 NOTE — Anesthesia Postprocedure Evaluation (Signed)
Anesthesia Post Note  Patient: Kayla Harrison  Procedure(s) Performed: TRANSESOPHAGEAL ECHOCARDIOGRAM (TEE) (N/A ) CARDIOVERSION (N/A )     Patient location during evaluation: Endoscopy Anesthesia Type: MAC Level of consciousness: awake and alert Pain management: pain level controlled Vital Signs Assessment: post-procedure vital signs reviewed and stable Respiratory status: spontaneous breathing, nonlabored ventilation, respiratory function stable and patient connected to nasal cannula oxygen Cardiovascular status: stable and blood pressure returned to baseline Postop Assessment: no apparent nausea or vomiting Anesthetic complications: no    Last Vitals:  Vitals:   10/03/17 1055 10/03/17 1105  BP: 113/61 117/67  Pulse: 71 71  Resp: (!) 22 17  Temp:    SpO2: 96% 98%    Last Pain:  Vitals:   10/03/17 1045  TempSrc: Oral                 Kare Dado COKER

## 2017-10-06 ENCOUNTER — Ambulatory Visit (INDEPENDENT_AMBULATORY_CARE_PROVIDER_SITE_OTHER): Payer: Medicare Other | Admitting: Pulmonary Disease

## 2017-10-06 ENCOUNTER — Ambulatory Visit (INDEPENDENT_AMBULATORY_CARE_PROVIDER_SITE_OTHER)
Admission: RE | Admit: 2017-10-06 | Discharge: 2017-10-06 | Disposition: A | Discharge status: Home / Self Care | Payer: Medicare Other | Source: Ambulatory Visit | Attending: Pulmonary Disease | Admitting: Pulmonary Disease

## 2017-10-06 ENCOUNTER — Encounter: Payer: Self-pay | Admitting: Pulmonary Disease

## 2017-10-06 VITALS — BP 102/70 | HR 75 | Ht 66.0 in | Wt 209.0 lb

## 2017-10-06 DIAGNOSIS — R05 Cough: Secondary | ICD-10-CM | POA: Diagnosis not present

## 2017-10-06 DIAGNOSIS — J90 Pleural effusion, not elsewhere classified: Secondary | ICD-10-CM

## 2017-10-06 DIAGNOSIS — R0609 Other forms of dyspnea: Secondary | ICD-10-CM

## 2017-10-06 DIAGNOSIS — R0602 Shortness of breath: Secondary | ICD-10-CM | POA: Diagnosis not present

## 2017-10-06 DIAGNOSIS — R058 Other specified cough: Secondary | ICD-10-CM

## 2017-10-06 MED ORDER — FLUTICASONE PROPIONATE 50 MCG/ACT NA SUSP: 1.0000 | Freq: Every day | NASAL | 2 refills | Status: DC

## 2017-10-06 NOTE — Patient Instructions (Signed)
Chest xray today  Nasal irrigation (saline nasal spray) daily Flonase one spray each nostril daily  Follow up in 2 weeks with Dr. Halford Chessman

## 2017-10-06 NOTE — Progress Notes (Signed)
[facility]  Chief Complaint  Patient presents with  . Hospitalization Follow-up    Pt was at [facility] hospital 09-15-17 through 09-17-17 for a-fib and difficulty breathing. Pt SOB w/exertion has increased, pt is fatigued for chemo and radiation treatments. Pt having hard time staying asleep during the night, and wakes up breathless and breathing fast patient states.    Vital signs: BP 102/70 (BP Location: Left Arm, Cuff Size: Normal)   Pulse 75   Ht _0  (1.676 m)   Wt 209 lb (94.8 kg)   SpO2 97%   BMI 33.73 kg/m   History of Present Illness: Kayla Harrison is a 72 y.o. female   She is followed by Dr. Lindi Adie for diffuse B cell lymphoma diagnosed in May 2018.  She has completed 6 rounds of R-CHOP.  This involved the superior mediastinum and Rt hilar region with encasement of the trachea and Rt mainstem bronchus with SVC obstruction.  She is also followed by cardiology for A flutter.  She was in hospital in December 2018 for palpitations and dyspnea related to a flutter.  She was also found to have Rt > Lt pleural effusions.  This was new compared to CT imaging from September 2018, but was present on PET scan from December 2018.  She was seen in 2014 for a right exudate pleural effusion after cardiac surgery.  She continues to have trouble with her breathing.  She gets sinus congestion and post nasal drip.  She has throat irritation and dry cough.  She gets globus sensation.  She also feels congested in her ears.  She has discomfort in her right chest and back when she takes a deep breath and when she lays down.  She is not having fever, hemoptysis, skin rash, or joint swelling.  She was also found to have kidney stones and is followed by urology.  She was to have nephrostomy tube, but wasn't off eliquis long enough.   Physical Exam:  General - pleasant Eyes - pupils reactive ENT - no sinus tenderness, no oral exudate, no LAN Cardiac -  irregular, no murmur Chest - decreased BS on Rt > Rt, bronchial BS RUL Abd - soft, non tender Ext - no edema Skin - no rashes Neuro - normal strength Psych - normal mood  CMP Latest Ref Rng & Units 09/26/2017 09/17/2017 09/16/2017  Glucose 65 - 99 mg/dL 94 139(H) 131(H)  BUN 6 - 20 mg/dL _1 Creatinine 0.44 - 1.00 mg/dL 0.91 1.05(H) 1.00  Sodium 135 - 145 mmol/L 138 136 136  Potassium 3.5 - 5.1 mmol/L 4.1 3.6 3.6  Chloride 101 - 111 mmol/L 109 104 105  CO2 22 - 32 mmol/L _2 Calcium 8.9 - 10.3 mg/dL 8.9 8.8(L) 8.9  Total Protein 6.5 - 8.1 g/dL - - -  Total Bilirubin 0.3 - 1.2 mg/dL - - -  Alkaline Phos 38 - 126 U/L - - -  AST 15 - 41 U/L - - -  ALT 14 - 54 U/L - - -    CBC Latest Ref Rng & Units 09/26/2017 09/17/2017 09/16/2017  WBC 4.0 - 10.5 K/uL 3.4(L) 4.8 5.3  Hemoglobin 12.0 - 15.0 g/dL 11.3(L) 10.0(L) 10.3(L)  Hematocrit 36.0 - 46.0 % 36.2 31.4(L) 32.1(L)  Platelets 150 - 400 K/uL 148(L) 152 158     Discussion: She has persistent symptoms of dyspnea.  She has recurrent A fib/flutter and likely has diastolic dysfunction.  She has recurrence of Rt > Lt pleural effusion.  She was also found to have Rt hydronephrosis with nephrolithiasis.  She has symptoms consistent with upper airway cough syndrome.  Assessment/Plan:  Recurrent Rt pleural effusion. - will repeat chest xray today and then determine if she needs to have thoracentesis  Recurrent A fib/flutter with diastolic CHF. - she is followed by cardiology  Upper airway cough syndrome. - nasal irrigation, flonase  Rt hydronephrosis. - f/u with urology  Radiation fibrosis RUL in setting no B cell lymphoma. - don't think she needs any intervention for this at this time - uncertain whether this is contributing to her current symptoms   Patient Instructions  Chest xray today  Nasal irrigation (saline nasal spray) daily Flonase one spray each nostril daily  Follow up in 2 weeks with Dr.  Beckey Rutter, MD [place] 10/06/2017, 9:48 AM Pager:  [phone]  Flow Sheet  Pulmonary tests: PFT 01/20/13 >> FEV1 2.97 (118%), FEV1% 86, TLC 5.91 (110%), DLCO 82% CT angio chest 09/15/17 >> b/l pleural effusions, radiation fibrosis RUL  Sleep tests:  Cardiac tests: Echo 09/17/17 >> EF 55 to 60%, mild MR, severe LA dilation, PAS 44 mmHg  Events:  Review of Systems: 12 point ROS negative except stated in HPI.  Past Medical History: She  has a past medical history of Anemia, Arthritis, Atrial fibrillation ([place]), Atrial fibrillation ([place]), Blood transfusion without reported diagnosis, Breast cancer ([place]), Chronic bilateral pleural effusions, Colon polyp, Complete heart block ([place]), Complication of anesthesia, Dyslipidemia, Dyspnea, H/O pleural effusion, Hepatitis, History of dysphagia, History of hiatal hernia, History of kidney stones, thyroid cancer, Hyperlipidemia, Hypothyroidism, Lung cancer, lower lobe ([place]) (01/2017), Morbid obesity ([place]), Non Hodgkin's lymphoma ([place]), Persistent atrial fibrillation ([place]), Presence of permanent cardiac pacemaker, Sinus node dysfunction ([place]), Stroke ([place]), and SVC syndrome.  Past Surgical History: She  has a past surgical history that includes Thyroidectomy (1998); bso (1998); Cholecystectomy; Tonsillectomy; Colonoscopy w/ polypectomy; afib ablation; Abdominal hysterectomy (1983); Breast lumpectomy (2010); Laparoscopic gastric banding (07/10/2010); Appendectomy; Knee arthroscopy; laparotomy; Total knee arthroplasty (04/13/2012); Cardioversion (10/09/2012); Cardioversion (10/09/2012); Cardioversion (N/A, 11/20/2012); Pacemaker insertion (03/10/2013); pocket revision (N/A, 12/08/2013); Eye surgery; Cystoscopy with retrograde pyelogram, ureteroscopy and stent placement (Right, 02/06/2015); Holmium laser application (N/A, 12/31/7351); Cystoscopy (N/A, 02/06/2015); Video bronchoscopy with endobronchial ultrasound (N/A, 02/07/2017); IR  FLUORO GUIDE PORT INSERTION RIGHT (02/24/2017); IR US Guide Vasc Access Right (02/24/2017); Bone marrow biopsy (02/21/2017); Cystoscopy with retrograde pyelogram, ureteroscopy and stent placement (Right, 03/07/2017); Holmium laser application (Right, 2/99/2426); IR PATIENT EVAL TECH 0-60 MINS (03/11/2017); Cardioversion (N/A, 07/18/2017); TEE with cardioversion (09/22/2017); PORTA CATH INSERTION; TEE without cardioversion (N/A, 10/03/2017); and Cardioversion (N/A, 10/03/2017).  Family History: Her family history includes Colon cancer in her father; Diabetes in her brother, paternal aunt, paternal grandmother, and sister; Heart attack in her father; Heart disease (age of onset: 40) in her father.  Social History: She  reports that  has never smoked. she has never used smokeless tobacco. She reports that she does not drink alcohol or use drugs.  Medications: Allergies as of 10/06/2017      Reactions   Tikosyn [dofetilide] Other (See Comments)   Prolonged QT interval Prolonged QT interval   Xarelto [rivaroxaban] Other (See Comments)   Nose Bleed X 6 hrs ; packing in ER   Epinephrine Other (See Comments), Rash   Dental form only (liquid). Patient stated she will become "out of it" she can hear you but cannot respond in normal fashion.  Ketamine    Hallucinations   Oral Anesthetic [benzocaine]    Adhesive [tape] Rash   Paper tape as well      Medication List        Accurate as of 10/06/17  9:48 AM. Always use your most recent med list.          amiodarone 200 MG tablet Commonly known as:  PACERONE Take 1 tablet (200 mg total) by mouth daily.   amiodarone 200 MG tablet Commonly known as:  PACERONE Take 2 tablets (400 mg) twice daily for 1 week, THEN take 2 tablets (400 mg) daily for 2 weeks, then 1 tablet (200 mg) daily   cycloSPORINE 0.05 % ophthalmic emulsion Commonly known as:  RESTASIS Place 1 drop into both eyes 2 (two) times daily.   diphenhydramine-acetaminophen 25-500 MG Tabs  tablet Commonly known as:  TYLENOL PM Take 2 tablets by mouth at bedtime.   ELIQUIS 5 MG Tabs tablet Generic drug:  apixaban TAKE 1 TABLET BY MOUTH TWO  TIMES DAILY   fluticasone 50 MCG/ACT nasal spray Commonly known as:  FLONASE Place 1 spray into both nostrils daily.   furosemide 20 MG tablet Commonly known as:  LASIX Take 1 tablets daily till you see your cardiolgist   levothyroxine 137 MCG tablet Commonly known as:  SYNTHROID, LEVOTHROID Take 1 tablet (137 mcg total) by mouth daily before breakfast.   multivitamin tablet Take 1 tablet by mouth daily.   oxyCODONE-acetaminophen 5-325 MG tablet Commonly known as:  PERCOCET/ROXICET Take 1 tablet by mouth daily as needed for severe pain.   XIIDRA 5 % Soln Generic drug:  Lifitegrast Apply 1 drop to eye 2 (two) times daily.

## 2017-10-07 ENCOUNTER — Ambulatory Visit (HOSPITAL_COMMUNITY): Payer: Medicare Other

## 2017-10-07 ENCOUNTER — Other Ambulatory Visit (HOSPITAL_COMMUNITY): Payer: Medicare Other

## 2017-10-07 ENCOUNTER — Telehealth: Payer: Self-pay | Admitting: Pulmonary Disease

## 2017-10-07 NOTE — Telephone Encounter (Signed)
Dg Chest 2 View  Result Date: 10/06/2017 CLINICAL DATA:  Just completed chemotherapy for lymphoma. History of lung and breast malignancy. Recently hospitalized for cough and pleural effusion. Patient notes cough and shortness of breath persist. EXAM: CHEST  2 VIEW COMPARISON:  PA and lateral chest x-ray of September 16, 2017 FINDINGS: The lungs are adequately inflated. There is chronic tenting of the dome of the right hemidiaphragm. There is no pleural effusion. There is no alveolar or interstitial infiltrate. The heart and pulmonary vascularity are normal. There is mild right hilar prominence which is stable. The power port catheter tip projects over the junction of the middle and distal thirds of the SVC. The ICD and the left atrial appendage clip are stable in position. The bony thorax exhibits no acute abnormality. IMPRESSION: There is no pneumonia nor other acute cardiopulmonary abnormality. Electronically Signed   By: David  Martinique M.D.   On: 10/06/2017 10:31    Discussed with pt.  No significant effusion.

## 2017-10-08 ENCOUNTER — Encounter: Payer: Self-pay | Admitting: Hematology and Oncology

## 2017-10-10 ENCOUNTER — Other Ambulatory Visit: Payer: Self-pay

## 2017-10-10 DIAGNOSIS — C383 Malignant neoplasm of mediastinum, part unspecified: Secondary | ICD-10-CM

## 2017-10-14 DIAGNOSIS — I4891 Unspecified atrial fibrillation: Secondary | ICD-10-CM | POA: Diagnosis not present

## 2017-10-14 DIAGNOSIS — Z7901 Long term (current) use of anticoagulants: Secondary | ICD-10-CM | POA: Diagnosis not present

## 2017-10-14 DIAGNOSIS — M19011 Primary osteoarthritis, right shoulder: Secondary | ICD-10-CM | POA: Diagnosis not present

## 2017-10-14 DIAGNOSIS — C8592 Non-Hodgkin lymphoma, unspecified, intrathoracic lymph nodes: Secondary | ICD-10-CM | POA: Diagnosis not present

## 2017-10-14 DIAGNOSIS — Z79891 Long term (current) use of opiate analgesic: Secondary | ICD-10-CM | POA: Diagnosis not present

## 2017-10-15 ENCOUNTER — Telehealth: Payer: Self-pay | Admitting: *Deleted

## 2017-10-15 DIAGNOSIS — Z1389 Encounter for screening for other disorder: Secondary | ICD-10-CM | POA: Diagnosis not present

## 2017-10-15 DIAGNOSIS — M898X1 Other specified disorders of bone, shoulder: Secondary | ICD-10-CM | POA: Diagnosis not present

## 2017-10-15 DIAGNOSIS — C50911 Malignant neoplasm of unspecified site of right female breast: Secondary | ICD-10-CM | POA: Diagnosis not present

## 2017-10-15 DIAGNOSIS — Z6833 Body mass index (BMI) 33.0-33.9, adult: Secondary | ICD-10-CM | POA: Diagnosis not present

## 2017-10-15 DIAGNOSIS — E890 Postprocedural hypothyroidism: Secondary | ICD-10-CM | POA: Diagnosis not present

## 2017-10-15 DIAGNOSIS — J90 Pleural effusion, not elsewhere classified: Secondary | ICD-10-CM | POA: Diagnosis not present

## 2017-10-15 DIAGNOSIS — I779 Disorder of arteries and arterioles, unspecified: Secondary | ICD-10-CM | POA: Diagnosis not present

## 2017-10-15 DIAGNOSIS — C884 Extranodal marginal zone B-cell lymphoma of mucosa-associated lymphoid tissue [MALT-lymphoma]: Secondary | ICD-10-CM | POA: Diagnosis not present

## 2017-10-15 DIAGNOSIS — C73 Malignant neoplasm of thyroid gland: Secondary | ICD-10-CM | POA: Diagnosis not present

## 2017-10-15 DIAGNOSIS — R7301 Impaired fasting glucose: Secondary | ICD-10-CM | POA: Diagnosis not present

## 2017-10-15 DIAGNOSIS — J984 Other disorders of lung: Secondary | ICD-10-CM | POA: Diagnosis not present

## 2017-10-15 DIAGNOSIS — I480 Paroxysmal atrial fibrillation: Secondary | ICD-10-CM | POA: Diagnosis not present

## 2017-10-15 NOTE — Telephone Encounter (Signed)
-----   Message from Deboraha Sprang, MD sent at 10/15/2017 11:50 AM EST ----- [name]  Thanks for reaching out  I have spoken with urology and radiology aobut her before I went on vacation  She was supposed to get the right kidney done, the more urgent with Hydronephrosis before she underwent cardioversion  Now she has to wait post cardioversion  I will also send this to Kathie Rhodes   ----- Message ----- From: Chesley Mires, MD Sent: 10/07/2017   1:51 PM To: Deboraha Sprang, MD  [name],  I saw [name] yesterday to assess her dyspnea.  She has a right hydronephrosis from a kidney stone.  Apparently urology wants her to have cardiac clearance prior to doing any interventions, but she has been running into trouble getting this set up.  Can you please look into this.  Thanks.  [place]

## 2017-10-16 ENCOUNTER — Encounter: Payer: Self-pay | Admitting: Internal Medicine

## 2017-10-16 DIAGNOSIS — C8592 Non-Hodgkin lymphoma, unspecified, intrathoracic lymph nodes: Secondary | ICD-10-CM | POA: Diagnosis not present

## 2017-10-16 DIAGNOSIS — M19011 Primary osteoarthritis, right shoulder: Secondary | ICD-10-CM | POA: Diagnosis not present

## 2017-10-16 DIAGNOSIS — Z7901 Long term (current) use of anticoagulants: Secondary | ICD-10-CM | POA: Diagnosis not present

## 2017-10-16 DIAGNOSIS — Z79891 Long term (current) use of opiate analgesic: Secondary | ICD-10-CM | POA: Diagnosis not present

## 2017-10-16 DIAGNOSIS — I4891 Unspecified atrial fibrillation: Secondary | ICD-10-CM | POA: Diagnosis not present

## 2017-10-20 ENCOUNTER — Telehealth: Payer: Self-pay

## 2017-10-20 ENCOUNTER — Ambulatory Visit: Payer: Medicare Other | Admitting: Pulmonary Disease

## 2017-10-20 DIAGNOSIS — Z7901 Long term (current) use of anticoagulants: Secondary | ICD-10-CM | POA: Diagnosis not present

## 2017-10-20 DIAGNOSIS — M19011 Primary osteoarthritis, right shoulder: Secondary | ICD-10-CM | POA: Diagnosis not present

## 2017-10-20 DIAGNOSIS — Z79891 Long term (current) use of opiate analgesic: Secondary | ICD-10-CM | POA: Diagnosis not present

## 2017-10-20 DIAGNOSIS — I4891 Unspecified atrial fibrillation: Secondary | ICD-10-CM | POA: Diagnosis not present

## 2017-10-20 DIAGNOSIS — C8592 Non-Hodgkin lymphoma, unspecified, intrathoracic lymph nodes: Secondary | ICD-10-CM | POA: Diagnosis not present

## 2017-10-20 NOTE — Telephone Encounter (Signed)
   [facility] Pre-operative Risk Assessment    Request for surgical clearance:  1. What type of surgery is being performed? Right Percutaneous nephrolithotomy, cystoscopy, bil retrograde  Pyelogram, left ureteral stent placement  2. When is this surgery scheduled?  Pending clearance  3. What type of clearance is required (medical clearance vs. Pharmacy clearance to hold med vs. Both)? Both medical and pharmacy clearance.  4. Are there any medications that need to be held prior to surgery and how long?  Please advise when patient will be allowed to hold eliquis prior to surgery  5. Practice name and name of physician performing surgery? Dr. Karsten Ro   6. What is your office phone and fax number? Phone [phone] Fax [phone]   7. Anesthesia type (None, local, MAC, general) ? General anesthesia   [place] 10/20/2017, 5:48 PM  _________________________________________________________________   (provider comments below)

## 2017-10-21 NOTE — Telephone Encounter (Signed)
Patient with diagnosis of atrial flutter on Eliquis for anticoagulation.    Procedure: nephrolithotomy, cystoscopy Date of procedure: pending  CHADS2-VASc score of  5 (CHF,  AGE, stroke/tia x 2, female)  CrCl 82.8 Platelet count 148  Per office protocol, patient can hold Eliquis for 2 days prior to procedure.   Patient will not need bridging with Lovenox (enoxaparin) around procedure.  If not bridging, patient should restart Eliquis on the evening of procedure or day after, at discretion of procedure MD

## 2017-10-21 NOTE — Telephone Encounter (Signed)
   Primary Cardiologist: No primary care provider on file.  Chart reviewed as part of pre-operative protocol coverage. Patient was contacted 10/21/2017 in reference to pre-operative risk assessment for pending surgery as outlined below.  Kayla Harrison was last seen on 09/19/17 by Dr. Caryl Comes.  Since that day, Kayla Harrison has done well. Maintaining sinus rhythm. She is able to achieve > 4 Mets of activity.   Therefore, based on ACC/AHA guidelines, the patient would be at acceptable risk for the planned procedure without further cardiovascular testing.   I will route this conversion to review anticoagulation.   [place], [place] 10/21/2017, 12:42 PM

## 2017-10-22 DIAGNOSIS — I4891 Unspecified atrial fibrillation: Secondary | ICD-10-CM | POA: Diagnosis not present

## 2017-10-22 DIAGNOSIS — C8592 Non-Hodgkin lymphoma, unspecified, intrathoracic lymph nodes: Secondary | ICD-10-CM | POA: Diagnosis not present

## 2017-10-22 DIAGNOSIS — M19011 Primary osteoarthritis, right shoulder: Secondary | ICD-10-CM | POA: Diagnosis not present

## 2017-10-22 DIAGNOSIS — Z79891 Long term (current) use of opiate analgesic: Secondary | ICD-10-CM | POA: Diagnosis not present

## 2017-10-22 DIAGNOSIS — Z7901 Long term (current) use of anticoagulants: Secondary | ICD-10-CM | POA: Diagnosis not present

## 2017-10-22 NOTE — Telephone Encounter (Signed)
I lmom for Dr. Simone Curia surgery scheduler clearance letter faxed via Epic with recommendations to Eliquis. Any questions call [phone].

## 2017-10-22 NOTE — Telephone Encounter (Signed)
  Pre-operative Risk Assessment - Provider Statement    Patient ID:  Kayla Harrison, DOB: 1946/09/15, MRN: [id number]   [name]'s chart has been reviewed for pre-operative risk assessment.   The patient was contacted 10/21/2017 by Leanor Kail, PA-C in reference to pre-operative risk assessment for pending surgery as outlined.  She was last seen on 09/19/17 by by Dr. Caryl Comes.  According to ACC/AHA guidelines, she does not require further testing and may proceed with surgery at acceptable risk.  Her chart was reviewed by the cardiovascular risk reduction clinic.  The patient may hold Eliquis for 2 days prior to her procedure.  Eliquis should be resumed the evening of the procedure or the day after.  A letter has been faxed to the requesting surgeon.  Please contact the surgeon's office to ensure that it has been received.  Signed,  Richardson Dopp, PA-C  10/22/2017 4:04 PM

## 2017-10-23 DIAGNOSIS — I4891 Unspecified atrial fibrillation: Secondary | ICD-10-CM | POA: Diagnosis not present

## 2017-10-23 DIAGNOSIS — Z79891 Long term (current) use of opiate analgesic: Secondary | ICD-10-CM | POA: Diagnosis not present

## 2017-10-23 DIAGNOSIS — C8592 Non-Hodgkin lymphoma, unspecified, intrathoracic lymph nodes: Secondary | ICD-10-CM | POA: Diagnosis not present

## 2017-10-23 DIAGNOSIS — M19011 Primary osteoarthritis, right shoulder: Secondary | ICD-10-CM | POA: Diagnosis not present

## 2017-10-23 DIAGNOSIS — Z7901 Long term (current) use of anticoagulants: Secondary | ICD-10-CM | POA: Diagnosis not present

## 2017-10-27 ENCOUNTER — Other Ambulatory Visit: Payer: Self-pay | Admitting: Urology

## 2017-10-27 DIAGNOSIS — M19011 Primary osteoarthritis, right shoulder: Secondary | ICD-10-CM | POA: Diagnosis not present

## 2017-10-27 DIAGNOSIS — I4891 Unspecified atrial fibrillation: Secondary | ICD-10-CM | POA: Diagnosis not present

## 2017-10-27 DIAGNOSIS — Z7901 Long term (current) use of anticoagulants: Secondary | ICD-10-CM | POA: Diagnosis not present

## 2017-10-27 DIAGNOSIS — C8592 Non-Hodgkin lymphoma, unspecified, intrathoracic lymph nodes: Secondary | ICD-10-CM | POA: Diagnosis not present

## 2017-10-27 DIAGNOSIS — Z79891 Long term (current) use of opiate analgesic: Secondary | ICD-10-CM | POA: Diagnosis not present

## 2017-10-28 ENCOUNTER — Ambulatory Visit (INDEPENDENT_AMBULATORY_CARE_PROVIDER_SITE_OTHER): Payer: Medicare Other | Admitting: *Deleted

## 2017-10-28 DIAGNOSIS — M19011 Primary osteoarthritis, right shoulder: Secondary | ICD-10-CM | POA: Diagnosis not present

## 2017-10-28 DIAGNOSIS — Z7901 Long term (current) use of anticoagulants: Secondary | ICD-10-CM | POA: Diagnosis not present

## 2017-10-28 DIAGNOSIS — Z5181 Encounter for therapeutic drug level monitoring: Secondary | ICD-10-CM | POA: Diagnosis not present

## 2017-10-28 DIAGNOSIS — I4819 Other persistent atrial fibrillation: Secondary | ICD-10-CM

## 2017-10-28 DIAGNOSIS — I4891 Unspecified atrial fibrillation: Secondary | ICD-10-CM | POA: Diagnosis not present

## 2017-10-28 DIAGNOSIS — I481 Persistent atrial fibrillation: Secondary | ICD-10-CM | POA: Diagnosis not present

## 2017-10-28 DIAGNOSIS — Z79891 Long term (current) use of opiate analgesic: Secondary | ICD-10-CM | POA: Diagnosis not present

## 2017-10-28 DIAGNOSIS — C8592 Non-Hodgkin lymphoma, unspecified, intrathoracic lymph nodes: Secondary | ICD-10-CM | POA: Diagnosis not present

## 2017-10-28 NOTE — Progress Notes (Signed)
Pt was started on Eliquis for Afib  on 04/2017.    Reviewed patients medication list.  Pt is not  currently on any combined P-gp and strong CYP3A4 inhibitors/inducers (ketoconazole, traconazole, ritonavir, carbamazepine, phenytoin, rifampin, St. John's wort).  Reviewed labs.  SCr 0.91,  Weight 92.7Kg, Age 72 yrs old. Dose  appropriate based on age, weight, and SCr.  Hgb and HCT 11.3/36.2.   A full discussion of the nature of anticoagulants has been carried out.  A benefit/risk analysis has been presented to the patient, so that they understand the justification for choosing anticoagulation with Eliquis at this time.  The need for compliance is stressed.  Pt is aware to take the medication twice daily.  Side effects of potential bleeding are discussed, including unusual colored urine or stools, coughing up blood or coffee ground emesis, nose bleeds or serious fall or head trauma.  Discussed signs and symptoms of stroke. The patient should avoid any OTC items containing aspirin or ibuprofen.  Avoid alcohol consumption.   Call if any signs of abnormal bleeding.  Discussed financial obligations and resolved any difficulty in obtaining medication.

## 2017-10-29 DIAGNOSIS — C8592 Non-Hodgkin lymphoma, unspecified, intrathoracic lymph nodes: Secondary | ICD-10-CM | POA: Diagnosis not present

## 2017-10-29 DIAGNOSIS — Z79891 Long term (current) use of opiate analgesic: Secondary | ICD-10-CM | POA: Diagnosis not present

## 2017-10-29 DIAGNOSIS — Z7901 Long term (current) use of anticoagulants: Secondary | ICD-10-CM | POA: Diagnosis not present

## 2017-10-29 DIAGNOSIS — M19011 Primary osteoarthritis, right shoulder: Secondary | ICD-10-CM | POA: Diagnosis not present

## 2017-10-29 DIAGNOSIS — I4891 Unspecified atrial fibrillation: Secondary | ICD-10-CM | POA: Diagnosis not present

## 2017-10-30 DIAGNOSIS — H6123 Impacted cerumen, bilateral: Secondary | ICD-10-CM | POA: Diagnosis not present

## 2017-10-31 DIAGNOSIS — Z7901 Long term (current) use of anticoagulants: Secondary | ICD-10-CM | POA: Diagnosis not present

## 2017-10-31 DIAGNOSIS — I4891 Unspecified atrial fibrillation: Secondary | ICD-10-CM | POA: Diagnosis not present

## 2017-10-31 DIAGNOSIS — C8592 Non-Hodgkin lymphoma, unspecified, intrathoracic lymph nodes: Secondary | ICD-10-CM | POA: Diagnosis not present

## 2017-10-31 DIAGNOSIS — M19011 Primary osteoarthritis, right shoulder: Secondary | ICD-10-CM | POA: Diagnosis not present

## 2017-10-31 DIAGNOSIS — Z79891 Long term (current) use of opiate analgesic: Secondary | ICD-10-CM | POA: Diagnosis not present

## 2017-11-03 DIAGNOSIS — Z6832 Body mass index (BMI) 32.0-32.9, adult: Secondary | ICD-10-CM | POA: Diagnosis not present

## 2017-11-03 DIAGNOSIS — H00019 Hordeolum externum unspecified eye, unspecified eyelid: Secondary | ICD-10-CM | POA: Diagnosis not present

## 2017-11-04 DIAGNOSIS — Z7901 Long term (current) use of anticoagulants: Secondary | ICD-10-CM | POA: Diagnosis not present

## 2017-11-04 DIAGNOSIS — I4891 Unspecified atrial fibrillation: Secondary | ICD-10-CM | POA: Diagnosis not present

## 2017-11-04 DIAGNOSIS — C8592 Non-Hodgkin lymphoma, unspecified, intrathoracic lymph nodes: Secondary | ICD-10-CM | POA: Diagnosis not present

## 2017-11-04 DIAGNOSIS — M19011 Primary osteoarthritis, right shoulder: Secondary | ICD-10-CM | POA: Diagnosis not present

## 2017-11-04 DIAGNOSIS — Z79891 Long term (current) use of opiate analgesic: Secondary | ICD-10-CM | POA: Diagnosis not present

## 2017-11-05 DIAGNOSIS — Z7901 Long term (current) use of anticoagulants: Secondary | ICD-10-CM | POA: Diagnosis not present

## 2017-11-05 DIAGNOSIS — I4891 Unspecified atrial fibrillation: Secondary | ICD-10-CM | POA: Diagnosis not present

## 2017-11-05 DIAGNOSIS — C8592 Non-Hodgkin lymphoma, unspecified, intrathoracic lymph nodes: Secondary | ICD-10-CM | POA: Diagnosis not present

## 2017-11-05 DIAGNOSIS — M19011 Primary osteoarthritis, right shoulder: Secondary | ICD-10-CM | POA: Diagnosis not present

## 2017-11-05 DIAGNOSIS — Z79891 Long term (current) use of opiate analgesic: Secondary | ICD-10-CM | POA: Diagnosis not present

## 2017-11-07 DIAGNOSIS — Z79891 Long term (current) use of opiate analgesic: Secondary | ICD-10-CM | POA: Diagnosis not present

## 2017-11-07 DIAGNOSIS — Z7901 Long term (current) use of anticoagulants: Secondary | ICD-10-CM | POA: Diagnosis not present

## 2017-11-07 DIAGNOSIS — C8592 Non-Hodgkin lymphoma, unspecified, intrathoracic lymph nodes: Secondary | ICD-10-CM | POA: Diagnosis not present

## 2017-11-07 DIAGNOSIS — M19011 Primary osteoarthritis, right shoulder: Secondary | ICD-10-CM | POA: Diagnosis not present

## 2017-11-07 DIAGNOSIS — I4891 Unspecified atrial fibrillation: Secondary | ICD-10-CM | POA: Diagnosis not present

## 2017-11-10 NOTE — Progress Notes (Signed)
10-20-17 (Epic) Cardiac clearance from [facility]  10-06-17 (Epic) CXR  09-24-17 (Epic) EKG  10-03-17 (Epic) ECHO

## 2017-11-10 NOTE — Patient Instructions (Addendum)
Kayla Harrison  11/10/2017   Your procedure is scheduled on: 11-17-17   Report to [facility] Main  Entrance Report to Interventional Radiology at 7:30 AM    Call this number if you have problems the morning of surgery [phone]   Remember: Do not eat food or drink liquids :After Midnight.     Take these medicines the morning of surgery with A SIP OF WATER: Amiodarone (Paceron), and Levothyroxine (Synthroid). You may also bring and use your eyedrops as needed.                                You may not have any metal on your body including hair pins and              piercings  Do not wear jewelry, make-up, lotions, powders or perfumes, deodorant             Do not wear nail polish.  Do not shave  48 hours prior to surgery.               Do not bring valuables to the hospital. Kayla Harrison.  Contacts, dentures or bridgework may not be worn into surgery.  Leave suitcase in the car. After surgery it may be brought to your room.                Please read over the following fact sheets you were given: _____________________________________________________________________          [facility] - Preparing for Surgery Before surgery, you can play an important role.  Because skin is not sterile, your skin needs to be as free of germs as possible.  You can reduce the number of germs on your skin by washing with CHG (chlorahexidine gluconate) soap before surgery.  CHG is an antiseptic cleaner which kills germs and bonds with the skin to continue killing germs even after washing. Please DO NOT use if you have an allergy to CHG or antibacterial soaps.  If your skin becomes reddened/irritated stop using the CHG and inform your nurse when you arrive at Short Stay. Do not shave (including legs and underarms) for at least 48 hours prior to the first CHG shower.  You may shave your face/neck. Please follow these instructions  carefully:  1.  Shower with CHG Soap the night before surgery and the  morning of Surgery.  2.  If you choose to wash your hair, wash your hair first as usual with your  normal  shampoo.  3.  After you shampoo, rinse your hair and body thoroughly to remove the  shampoo.                           4.  Use CHG as you would any other liquid soap.  You can apply chg directly  to the skin and wash                       Gently with a scrungie or clean washcloth.  5.  Apply the CHG Soap to your body ONLY FROM THE NECK DOWN.   Do not use on face/ open  Wound or open sores. Avoid contact with eyes, ears mouth and genitals (private parts).                       Wash face,  Genitals (private parts) with your normal soap.             6.  Wash thoroughly, paying special attention to the area where your surgery  will be performed.  7.  Thoroughly rinse your body with warm water from the neck down.  8.  DO NOT shower/wash with your normal soap after using and rinsing off  the CHG Soap.                9.  Pat yourself dry with a clean towel.            10.  Wear clean pajamas.            11.  Place clean sheets on your bed the night of your first shower and do not  sleep with pets. Day of Surgery : Do not apply any lotions/deodorants the morning of surgery.  Please wear clean clothes to the hospital/surgery center.  FAILURE TO FOLLOW THESE INSTRUCTIONS MAY RESULT IN THE CANCELLATION OF YOUR SURGERY PATIENT SIGNATURE_________________________________  NURSE SIGNATURE__________________________________  ________________________________________________________________________

## 2017-11-11 DIAGNOSIS — M19011 Primary osteoarthritis, right shoulder: Secondary | ICD-10-CM | POA: Diagnosis not present

## 2017-11-11 DIAGNOSIS — Z79891 Long term (current) use of opiate analgesic: Secondary | ICD-10-CM | POA: Diagnosis not present

## 2017-11-11 DIAGNOSIS — I4891 Unspecified atrial fibrillation: Secondary | ICD-10-CM | POA: Diagnosis not present

## 2017-11-11 DIAGNOSIS — C8592 Non-Hodgkin lymphoma, unspecified, intrathoracic lymph nodes: Secondary | ICD-10-CM | POA: Diagnosis not present

## 2017-11-11 DIAGNOSIS — Z7901 Long term (current) use of anticoagulants: Secondary | ICD-10-CM | POA: Diagnosis not present

## 2017-11-12 ENCOUNTER — Encounter (HOSPITAL_COMMUNITY)
Admission: RE | Admit: 2017-11-12 | Discharge: 2017-11-12 | Disposition: A | Discharge status: Home / Self Care | Payer: Medicare Other | Source: Ambulatory Visit | Attending: Urology | Admitting: Urology

## 2017-11-12 ENCOUNTER — Encounter (HOSPITAL_COMMUNITY): Payer: Self-pay

## 2017-11-12 ENCOUNTER — Other Ambulatory Visit: Payer: Self-pay

## 2017-11-12 DIAGNOSIS — Z01812 Encounter for preprocedural laboratory examination: Secondary | ICD-10-CM | POA: Insufficient documentation

## 2017-11-12 DIAGNOSIS — N200 Calculus of kidney: Secondary | ICD-10-CM | POA: Insufficient documentation

## 2017-11-12 LAB — BASIC METABOLIC PANEL
ANION GAP: 9 (ref 5–15)
BUN: 21 mg/dL — AB (ref 6–20)
CALCIUM: 9.3 mg/dL (ref 8.9–10.3)
CO2: 25 mmol/L (ref 22–32)
Chloride: 108 mmol/L (ref 101–111)
Creatinine, Ser: 1.03 mg/dL — ABNORMAL HIGH (ref 0.44–1.00)
GFR calc Af Amer: >60 mL/min (ref >60)
GFR, EST NON AFRICAN AMERICAN: 53 mL/min — AB (ref >60)
Glucose, Bld: 103 mg/dL — ABNORMAL HIGH (ref 65–99)
POTASSIUM: 4.4 mmol/L (ref 3.5–5.1)
SODIUM: 142 mmol/L (ref 135–145)

## 2017-11-12 LAB — CBC
HEMATOCRIT: 37.5 % (ref 36.0–46.0)
HEMOGLOBIN: 11.8 g/dL — AB (ref 12.0–15.0)
MCH: 29.9 pg (ref 26.0–34.0)
MCHC: 31.5 g/dL (ref 30.0–36.0)
MCV: 95.2 fL (ref 78.0–100.0)
Platelets: 168 10*3/uL (ref 150–400)
RBC: 3.94 MIL/uL (ref 3.87–5.11)
RDW: 14 % (ref 11.5–15.5)
WBC: 4.9 10*3/uL (ref 4.0–10.5)

## 2017-11-14 DIAGNOSIS — M19011 Primary osteoarthritis, right shoulder: Secondary | ICD-10-CM | POA: Diagnosis not present

## 2017-11-14 DIAGNOSIS — I4891 Unspecified atrial fibrillation: Secondary | ICD-10-CM | POA: Diagnosis not present

## 2017-11-14 DIAGNOSIS — C8592 Non-Hodgkin lymphoma, unspecified, intrathoracic lymph nodes: Secondary | ICD-10-CM | POA: Diagnosis not present

## 2017-11-14 DIAGNOSIS — Z7901 Long term (current) use of anticoagulants: Secondary | ICD-10-CM | POA: Diagnosis not present

## 2017-11-14 DIAGNOSIS — Z79891 Long term (current) use of opiate analgesic: Secondary | ICD-10-CM | POA: Diagnosis not present

## 2017-11-16 ENCOUNTER — Other Ambulatory Visit: Payer: Self-pay | Admitting: Radiology

## 2017-11-16 NOTE — Discharge Instructions (Signed)
Post percutaneous nephrolithotomy and stent ° placement instructions ° ° °Definitions: ° °Ureter: The duct that transports urine from the kidney to the bladder. °Stent: A plastic hollow tube that is placed into the ureter, from the kidney to the bladder to prevent the ureter from swelling shut. ° °General instructions: ° °Despite the fact that only a small skin incision was used, the area around the kidney, ureter and bladder is raw and irritated. The stent is a foreign body which can further irritate the bladder wall. This irritation is manifested by increased frequency of urination, both day and night, and by an increase in the urge to urinate. In some, the urge to urinate is present almost always. Sometimes the urge is strong enough that you may not be able to stop your self from urinating. This can often be controlled with medication but does not occur in everyone. A stent can safely be left in place for 3 months or greater. ° °You may see some blood in your urine while the stent is in place and a few days afterward. Do not be alarmed, even if the urine is clear for a while. Get off your feet and drink lots of fluids until clearing occurs. If you start to pass clots or don't improve, call us. ° °Diet: ° °You may return to your normal diet immediately. Because of the raw surface of your bladder, alcohol, spicy foods, foods high in acid and drinks with caffeine may cause irritation or frequency and should be used in moderation. To keep your urine flowing freely and avoid constipation, drink plenty of fluids during the day (8-10 glasses). Tip: Avoid cranberry juice because it is very acidic. ° °Activity: ° °Your physical activity doesn't need to be restricted. However, if you are very active, you may see some blood in the urine. We suggest that you reduce your activity under the circumstances until the bleeding has stopped. ° °Bowels: ° °It is important to keep your bowels regular during the postoperative period.  Straining with bowel movements can cause bleeding. A bowel movement every other day is reasonable. Use a mild laxative if needed, such as milk of magnesia 2-3 tablespoons, or 2 Dulcolax tablets. Call if you continue to have problems. If you had been taking narcotics for pain, before, during or after your surgery, you may be constipated. Take a laxative if necessary. ° °Medication: ° °You should resume your pre-surgery medications unless told not to. In addition you may be given an antibiotic to prevent or treat infection. Antibiotics are not always necessary. All medication should be taken as prescribed until the bottles are finished unless you are having an unusual reaction to one of the drugs. ° °Problems you should report to us: ° °a. Fever greater than 101°F. °b. Heavy bleeding, or clots (see notes above about blood in urine). °c. Inability to urinate. °d. Drug reactions (hives, rash, nausea, vomiting, diarrhea). °e. Severe burning or pain with urination that is not improving. ° °

## 2017-11-16 NOTE — H&P (Signed)
HPI: Kayla Harrison is a 72 year-old female with bilateral ureteral calculi.  02/20/17: She underwent a PET scan on 02/19/17 which revealed right hydronephrosis with a 1.4 cm stone located in the renal pelvis on the right hand side that was not causing obstruction and had Hounsfield units of 1300. There was a 9 mm stone in the proximal right ureter with Hounsfield units of 800. No other right renal calculi were noted. There were stones seen in the periphery of the left kidney as well.   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 She subsequently undergone radiation and this has helped significantly. She is scheduled to undergo chemotherapy eventually. One week ago she had an episode of gross hematuria but she reports she is not having any flank pain and no change in her voiding pattern.   08/27/17: Patient with past history of renal stones. She underwent right ureteroscopy in June 2018, but never followed up for RUS. Patient returns today with complaints of gross hematuria that has been ongoing for the past few days. She also has had some intermittent left back pain, as well. However, she denies any current back or flank pain. She continues to have gross hematuria today. She denies any significant change in voiding symptoms. Urine output seems to be normal. She denies any dysuria, fever, chills, nausea, or vomiting. She underwent repeat PET scan this morning. She is currently undergoing chemo, as well.   08/29/17: She returns today with complains of generalized lower abdominal pain. She states that this began earlier today. She also complains of some intermittent left flank pain. She continues to have gross hematuria intermittently, as well. She denies fevers, nausea, vomiting, or exacerbation of voiding symptom. She denies any changes in urine output. She denies constipation.   The patient's stone was on her  bilateral side. This is not her first kidney stone. There is not a history of calculus disease in the family. She does not have a burning sensation when she urinates. She has not caught a stone in her urine strainer since her symptoms began.   She has had Ureteroscopy for treatment of her stones in the past.     ALLERGIES: EPINEPHrine HCl SOLN Xarelto TABS    MEDICATIONS: Eliquis  Furosemide 40 mg tablet Oral  Levothyroxine Sodium 150 MCG Oral Tablet Oral  Multi Vitamin/Minerals TABS Oral  Restasis 0.05 % dropperette, single-use drop dispenser Ophthalmic  Xiidra 5 % dropperette, single-use drop dispenser     GU PSH: Catheterization For Collection Of Specimen, Single Patient, All Places Of Service - 08/27/2017 Catheterize For Residual - 08/27/2017 Cysto Uretero Lithotripsy - 2016 Cystoscopy Insert Stent - 2016 Hysterectomy Unilat SO - 2008 Ovary Removal Partial or Total - 2008 Ureteroscopic laser litho, Right - 03/07/2017      PSH Notes: Cystoscopy With Ureteroscopy With Lithotripsy, Cystoscopy With Insertion Of Ureteral Stent Right, Pacemaker Placement, Lung Surgery, Pacemaker Placement, Heart Surgery, Breast Surgery Lumpectomy, Laparosc Gastric Restrictive Proc By Adjustable Gastric Band, Knee Replacement, Oophorectomy - Bilateral (Removal Of Both Ovaries), Cholecystectomy, Thyroid Surgery Total Thyroidectomy, Hysterectomy, Tonsillectomy   NON-GU PSH: Cholecystectomy (open) - 2008 Lung Surgery (Unspecified) - 2014 Pacemaker placement - 2014 Remove Breast Lesion - 2014 Remove Thyroid - 2008 Remove Tonsils - 2008 Revise Knee Joint - 2014    GU PMH: Ureteral calculus - 08/27/2017, - 03/19/2017 (Acute), Right, We discussed the management of urinary stones. These options include observation,  ureteroscopy, shockwave lithotripsy, and PCNL. We discussed which options are relevant to these particular stones. We discussed the natural history of stones as well as the complications of untreated  stones and the impact on quality of life without treatment as well as with each of the above listed treatments. We also discussed the efficacy of each treatment in its ability to clear the stone burden. With any of these management options I discussed the signs and symptoms of infection and the need for emergent treatment should these be experienced. For each option we discussed the ability of each procedure to clear the patient of their stone burden. For observation I described the risks which include but are not limited to silent renal damage, life-threatening infection, need for emergent surgery, failure to pass stone, and pain. For ureteroscopy I described the risks which include heart attack, stroke, pulmonary embolus, death, bleeding, infection, damage to contiguous structures, positioning injury, ureteral stricture, ureteral avulsion, ureteral injury, need for ureteral stent, inability to perform ureteroscopy, need for an interval procedure, inability to clear stone burden, stent discomfort and pain. For shockwave lithotripsy I described the risks which include arrhythmia, kidney contusion, kidney hemorrhage, need for transfusion, long-term risk of diabetes or hypertension, back discomfort, flank ecchymosis, flank abrasion, inability to break up stone, inability to pass stone fragments, Steinstrasse, infection associated with obstructing stones, need for different surgical procedure and possible need for repeat shockwave lithotripsy. , - 02/20/2017 Renal calculus, Bilateral, She has a right renal calculus and this is not causing obstruction. It has been present and essentially is unchanged since 2013. - 02/20/2017, Bilateral kidney stones, - 2016 Ureteral obstruction secondary to calculous (Acute), Right, Right ureter is obstructed by her stone and she is going to be undergoing chemotherapy so we discussed the need to get her ureter unobstructed in order to maximize her renal function for her chemotherapy. -  02/20/2017 Hydronephrosis Unspec, Hydronephrosis, right - 2016 Urinary Retention, Unspec, Incomplete bladder emptying - 2014      PMH Notes: Bilateral nephrolithiasis: CT scan 3/07 - bilateral nephrolithiasis. He was seen by Dr. Terance Hart for a right UPJ stone but failed to followup.  CT scan 09/10/12 - bilateral nephrolithiasis with a 14 x 9 mm stone in the right renal pelvis without obstruction (1400 Hounsfield units). A 4 mm stone was noted on the floor of the bladder.  KUB 02/18/13 revealed the large stone in her right kidney appears to be overlying the lower pole rather than renal pelvis as well as a second and possibly third stone on the right-hand side. On the left-hand side I can definitely see 2 stones in the lower pole.  CT scan 12/15/14 revealed bilateral nonobstructing renal calculi as well as a 6.5 mm stone in her distal right ureter.  Right ureteroscopy and laser lithotripsy 02/06/15  She has a large right renal pelvic stone and she has elected to not undergo treatment unless absolutely necessary.  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     NON-GU PMH: Encounter for general adult medical examination without abnormal findings, Encounter for preventive health examination - 2016 Obesity, Morbid Obesity - 2014 Personal history of other diseases of the circulatory system, History of hypertension - 2014 Personal history of other endocrine, nutritional and metabolic disease, History of hypercholesterolemia - 2014, History of hypothyroidism, - 2014 Unspecified atrial fibrillation, Atrial Fibrillation - 2014    FAMILY HISTORY: Coronary Artery Disease - Father, Grandfather Death - Father, Mother Diabetes - Grandfather, Father Urologic Disorder -  Father   SOCIAL HISTORY: Marital Status: Single Preferred Language: English; Ethnicity: Not Hispanic Or Latino; Race: White, American [place] or [place] Native Current Smoking Status: Patient has never smoked.   Tobacco Use  Assessment Completed: Used Tobacco in last 30 days? Does not drink anymore.  Does not drink caffeine. Patient's occupation is/was Retired.     Notes: Never A Smoker, Occupation:, Tobacco Use, Caffeine Use, Marital History - Single, Alcohol Use   REVIEW OF SYSTEMS:    GU Review Female:   Patient denies frequent urination, hard to postpone urination, burning /pain with urination, get up at night to urinate, leakage of urine, stream starts and stops, trouble starting your stream, have to strain to urinate, and being pregnant.  Gastrointestinal (Upper):   Patient denies vomiting, indigestion/ heartburn, and nausea.  Gastrointestinal (Lower):   Patient denies diarrhea and constipation.  Constitutional:   Patient denies fever, night sweats, weight loss, and fatigue.  Skin:   Patient denies skin rash/ lesion and itching.  Eyes:   Patient denies blurred vision and double vision.  Ears/ Nose/ Throat:   Patient denies sore throat and sinus problems.  Hematologic/Lymphatic:   Patient denies swollen glands and easy bruising.  Cardiovascular:   Patient denies leg swelling and chest pains.  Respiratory:   Patient reports shortness of breath. Patient denies cough.  Endocrine:   Patient denies excessive thirst.  Musculoskeletal:   Patient reports back pain. Patient denies joint pain.  Neurological:   Patient denies headaches and dizziness.  Psychologic:   Patient denies depression and anxiety.   VITAL SIGNS:   Weight 210 lb / 95.25 kg  Height 66 in / 167.64 cm  BP 150/81 mmHg  Pulse 92 /min  Temperature 98.1 F / 36.7 C  BMI 33.9 kg/m   MULTI-SYSTEM PHYSICAL EXAMINATION:    Constitutional: Well-nourished. No physical deformities. Normally developed. Good grooming. Frail. Hair loss due to chemo.   Respiratory: Normal breath sounds. No labored breathing, no use of accessory muscles.   Cardiovascular: Regular rate and rhythm. No murmur, no gallop. Normal temperature, normal extremity pulses, no  swelling, no varicosities. Pacemaker.   Skin: No paleness, no jaundice, no cyanosis. No lesion, no ulcer, no rash.  Neurologic / Psychiatric: Oriented to time, oriented to place, oriented to person. No depression, no anxiety, no agitation.  Gastrointestinal: Generalized Abdominal tenderness. No mass, no rigidity, non obese abdomen.   Musculoskeletal: Spine, ribs, pelvis no bilateral tenderness. Normal gait and station of head and neck.     PAST DATA REVIEWED:  Source Of History:  Patient  Records Review:   Previous Patient Records  Urine Test Review:   Urinalysis, Urine Culture  X-Ray Review: PET Scan: Reviewed Films. Reviewed Report.     PROCEDURES:         Renal Ultrasound done on 08/29/17  Right Kidney: Length: 10.1 cm Depth: 5.7 cm Cortical Width:0.9 cm Width: 4.4 cm  Left Kidney: Length:9.5 cm Depth:5.4 cm Cortical Width: 1.1 cm Width: 5.0 cm  Left Kidney/Ureter:  Hydronephrosis noted. ----- Multiple echogenic foci with and without shadowing ( calcifications vs vessels). There is a 1.3 cm calcification in the ureter. The renal pelvis measures 2.2 cm.   Right Kidney/Ureter:  ? Fullness vs mild hydronephrosis. ----- Multiple echogenic foci with and without shadowing. There is a 1.5 cm calcification medial and inferior to the right kidney ( ?UPJ).   Bladder:  PVR = 42.6 ml.           Urinalysis w/Scope Dipstick  Dipstick Cont'd Micro  Color: Brown Bilirubin: Invalid WBC/hpf: 10 - 20/hpf  Appearance: Turbid Ketones: Invalid RBC/hpf: >60/hpf  Specific Gravity: Invalid Blood: Invalid Bacteria: Few (10-25/hpf)  pH: Invalid Protein: Invalid Cystals: NS (Not Seen)  Glucose: Invalid Urobilinogen: Invalid Casts: NS (Not Seen)    Nitrites: Invalid Trichomonas: Not Present    Leukocyte Esterase: Invalid Mucous: Not Present      Epithelial Cells: 0 - 5/hpf      Yeast: NS (Not Seen)      Sperm: Not Present      ASSESSMENT: Renal calculi - She had a PET CT which revealed a right renal  pelvis stone (Approximatly 1100 HU; ~2 cm) with hydronephrosis noted.  She had a stone located in the renal pelvis since 2013 and it was not causing any flank pain, hematuria or other symptoms nor was it obstructing her right kidney however on her most recent PET scan she clearly demonstrated the presence of hydronephrosis.  Her renal function has remained stable with her most recent creatinine on 11/12/17 having been noted to be 1.03.  There are now 2 stones noted in the left renal pelvic area (Approximatly 682 HU; 13 mm and 6 mm in size) with minimal obstructive changes noted.  These will need to be addressed however the question remains whether she should undergo ureteroscopic management at the time of her PCNL or have a stent placed in order to prevent obstruction with ureteroscopy at a later date.  With bilateral stones my recommendation to her as been to proceed with treatment of her right renal pelvic stone since it has been causing some obstruction.  If this proceeds without difficulty then consideration of ureteroscopic management at that same time would be entertained however if there is any concern at the time of surgery regarding possible obstruction occurring on the right hand side I would consider placement of a stent only versus no procedure at that time on the left-hand side.

## 2017-11-17 ENCOUNTER — Ambulatory Visit (HOSPITAL_COMMUNITY): Payer: Medicare Other

## 2017-11-17 ENCOUNTER — Encounter (HOSPITAL_COMMUNITY): Payer: Self-pay | Admitting: *Deleted

## 2017-11-17 ENCOUNTER — Ambulatory Visit (HOSPITAL_COMMUNITY): Payer: Medicare Other | Admitting: Anesthesiology

## 2017-11-17 ENCOUNTER — Encounter (HOSPITAL_COMMUNITY)
Admission: RE | Disposition: A | Discharge status: Home / Self Care | Payer: Self-pay | Source: Ambulatory Visit | Attending: Urology

## 2017-11-17 ENCOUNTER — Ambulatory Visit (HOSPITAL_COMMUNITY)
Admission: RE | Admit: 2017-11-17 | Discharge: 2017-11-18 | Disposition: A | Discharge status: Home / Self Care | Payer: Medicare Other | Source: Ambulatory Visit | Attending: Urology | Admitting: Urology

## 2017-11-17 ENCOUNTER — Ambulatory Visit (HOSPITAL_COMMUNITY)
Admission: RE | Admit: 2017-11-17 | Discharge: 2017-11-17 | Disposition: A | Discharge status: Home / Self Care | Payer: Medicare Other | Source: Ambulatory Visit | Attending: Urology | Admitting: Urology

## 2017-11-17 ENCOUNTER — Other Ambulatory Visit: Payer: Self-pay

## 2017-11-17 DIAGNOSIS — Z888 Allergy status to other drugs, medicaments and biological substances status: Secondary | ICD-10-CM | POA: Insufficient documentation

## 2017-11-17 DIAGNOSIS — Z8572 Personal history of non-Hodgkin lymphomas: Secondary | ICD-10-CM | POA: Insufficient documentation

## 2017-11-17 DIAGNOSIS — J90 Pleural effusion, not elsewhere classified: Secondary | ICD-10-CM | POA: Insufficient documentation

## 2017-11-17 DIAGNOSIS — N132 Hydronephrosis with renal and ureteral calculous obstruction: Secondary | ICD-10-CM | POA: Insufficient documentation

## 2017-11-17 DIAGNOSIS — E785 Hyperlipidemia, unspecified: Secondary | ICD-10-CM | POA: Insufficient documentation

## 2017-11-17 DIAGNOSIS — Z79899 Other long term (current) drug therapy: Secondary | ICD-10-CM | POA: Insufficient documentation

## 2017-11-17 DIAGNOSIS — Z950 Presence of cardiac pacemaker: Secondary | ICD-10-CM | POA: Diagnosis not present

## 2017-11-17 DIAGNOSIS — Z87442 Personal history of urinary calculi: Secondary | ICD-10-CM | POA: Insufficient documentation

## 2017-11-17 DIAGNOSIS — N200 Calculus of kidney: Secondary | ICD-10-CM | POA: Diagnosis not present

## 2017-11-17 DIAGNOSIS — I5031 Acute diastolic (congestive) heart failure: Secondary | ICD-10-CM | POA: Diagnosis not present

## 2017-11-17 DIAGNOSIS — G4730 Sleep apnea, unspecified: Secondary | ICD-10-CM | POA: Insufficient documentation

## 2017-11-17 DIAGNOSIS — Z8673 Personal history of transient ischemic attack (TIA), and cerebral infarction without residual deficits: Secondary | ICD-10-CM | POA: Insufficient documentation

## 2017-11-17 DIAGNOSIS — Z853 Personal history of malignant neoplasm of breast: Secondary | ICD-10-CM | POA: Diagnosis not present

## 2017-11-17 DIAGNOSIS — N202 Calculus of kidney with calculus of ureter: Secondary | ICD-10-CM | POA: Diagnosis not present

## 2017-11-17 DIAGNOSIS — E039 Hypothyroidism, unspecified: Secondary | ICD-10-CM | POA: Insufficient documentation

## 2017-11-17 DIAGNOSIS — I509 Heart failure, unspecified: Secondary | ICD-10-CM | POA: Diagnosis not present

## 2017-11-17 DIAGNOSIS — N201 Calculus of ureter: Secondary | ICD-10-CM | POA: Diagnosis present

## 2017-11-17 DIAGNOSIS — Z7901 Long term (current) use of anticoagulants: Secondary | ICD-10-CM | POA: Insufficient documentation

## 2017-11-17 DIAGNOSIS — I481 Persistent atrial fibrillation: Secondary | ICD-10-CM | POA: Diagnosis not present

## 2017-11-17 DIAGNOSIS — Z9884 Bariatric surgery status: Secondary | ICD-10-CM | POA: Insufficient documentation

## 2017-11-17 DIAGNOSIS — Z6832 Body mass index (BMI) 32.0-32.9, adult: Secondary | ICD-10-CM | POA: Insufficient documentation

## 2017-11-17 DIAGNOSIS — Z884 Allergy status to anesthetic agent status: Secondary | ICD-10-CM | POA: Insufficient documentation

## 2017-11-17 DIAGNOSIS — I110 Hypertensive heart disease with heart failure: Secondary | ICD-10-CM | POA: Diagnosis not present

## 2017-11-17 DIAGNOSIS — Z85118 Personal history of other malignant neoplasm of bronchus and lung: Secondary | ICD-10-CM | POA: Diagnosis not present

## 2017-11-17 DIAGNOSIS — Z85850 Personal history of malignant neoplasm of thyroid: Secondary | ICD-10-CM | POA: Insufficient documentation

## 2017-11-17 HISTORY — PX: CYSTOSCOPY W/ RETROGRADES: SHX1426

## 2017-11-17 HISTORY — PX: NEPHROLITHOTOMY: SHX5134

## 2017-11-17 HISTORY — PX: IR NEPHROSTOMY PLACEMENT RIGHT: IMG6064

## 2017-11-17 HISTORY — PX: HOLMIUM LASER APPLICATION: SHX5852

## 2017-11-17 LAB — BASIC METABOLIC PANEL
Anion gap: 11 (ref 5–15)
BUN: 27 mg/dL — AB (ref 6–20)
CHLORIDE: 110 mmol/L (ref 101–111)
CO2: 22 mmol/L (ref 22–32)
CREATININE: 1.05 mg/dL — AB (ref 0.44–1.00)
Calcium: 9.4 mg/dL (ref 8.9–10.3)
GFR calc Af Amer: 60 mL/min — ABNORMAL LOW (ref >60)
GFR calc non Af Amer: 51 mL/min — ABNORMAL LOW (ref >60)
Glucose, Bld: 109 mg/dL — ABNORMAL HIGH (ref 65–99)
Potassium: 4 mmol/L (ref 3.5–5.1)
SODIUM: 143 mmol/L (ref 135–145)

## 2017-11-17 LAB — CBC WITH DIFFERENTIAL/PLATELET
Basophils Absolute: 0 10*3/uL (ref 0.0–0.1)
Basophils Relative: 1 %
EOS ABS: 0.1 10*3/uL (ref 0.0–0.7)
Eosinophils Relative: 3 %
HCT: 36.7 % (ref 36.0–46.0)
Hemoglobin: 11.7 g/dL — ABNORMAL LOW (ref 12.0–15.0)
LYMPHS ABS: 0.4 10*3/uL — AB (ref 0.7–4.0)
Lymphocytes Relative: 10 %
MCH: 30.4 pg (ref 26.0–34.0)
MCHC: 31.9 g/dL (ref 30.0–36.0)
MCV: 95.3 fL (ref 78.0–100.0)
MONOS PCT: 16 %
Monocytes Absolute: 0.6 10*3/uL (ref 0.1–1.0)
Neutro Abs: 2.9 10*3/uL (ref 1.7–7.7)
Neutrophils Relative %: 70 %
Platelets: 171 10*3/uL (ref 150–400)
RBC: 3.85 MIL/uL — AB (ref 3.87–5.11)
RDW: 14.2 % (ref 11.5–15.5)
WBC: 4.1 10*3/uL (ref 4.0–10.5)

## 2017-11-17 LAB — PROTIME-INR
INR: 1.01
PROTHROMBIN TIME: 13.2 s (ref 11.4–15.2)

## 2017-11-17 SURGERY — NEPHROLITHOTOMY PERCUTANEOUS
Anesthesia: General | Laterality: Right

## 2017-11-17 MED ORDER — FENTANYL CITRATE (PF) 100 MCG/2ML IJ SOLN
INTRAMUSCULAR | Status: DC | PRN
  Administered 2017-11-17: 100 ug via INTRAVENOUS
  Administered 2017-11-17 (×3): 50 ug via INTRAVENOUS

## 2017-11-17 MED ORDER — HEPARIN SOD (PORK) LOCK FLUSH 100 UNIT/ML IV SOLN
500.0000 [IU] | INTRAVENOUS | Status: DC
  Filled 2017-11-17: qty 5

## 2017-11-17 MED ORDER — OXYCODONE HCL 5 MG PO TABS: 5.0000 mg | ORAL_TABLET | Freq: Once | ORAL | Status: DC | PRN

## 2017-11-17 MED ORDER — ACETAMINOPHEN 160 MG/5ML PO SOLN: 325.0000 mg | ORAL | Status: DC | PRN

## 2017-11-17 MED ORDER — IOPAMIDOL (ISOVUE-300) INJECTION 61%
50.0000 mL | Freq: Once | INTRAVENOUS | Status: AC | PRN
  Administered 2017-11-17: 6 mL

## 2017-11-17 MED ORDER — IOPAMIDOL (ISOVUE-300) INJECTION 61%
INTRAVENOUS | Status: AC
  Administered 2017-11-17: 6 mL
  Filled 2017-11-17: qty 50

## 2017-11-17 MED ORDER — HYOSCYAMINE SULFATE 0.125 MG SL SUBL
0.1250 mg | SUBLINGUAL_TABLET | SUBLINGUAL | Status: DC | PRN
  Administered 2017-11-17 – 2017-11-18 (×3): 0.125 mg via SUBLINGUAL
  Filled 2017-11-17 (×3): qty 1

## 2017-11-17 MED ORDER — FENTANYL CITRATE (PF) 100 MCG/2ML IJ SOLN
INTRAMUSCULAR | Status: AC
  Filled 2017-11-17: qty 2

## 2017-11-17 MED ORDER — HEPARIN SOD (PORK) LOCK FLUSH 100 UNIT/ML IV SOLN
500.0000 [IU] | INTRAVENOUS | Status: DC | PRN
  Filled 2017-11-17: qty 5

## 2017-11-17 MED ORDER — LIDOCAINE-EPINEPHRINE (PF) 2 %-1:200000 IJ SOLN
INTRAMUSCULAR | Status: AC
  Filled 2017-11-17: qty 20

## 2017-11-17 MED ORDER — PROPOFOL 10 MG/ML IV BOLUS
INTRAVENOUS | Status: DC | PRN
  Administered 2017-11-17: 100 mg via INTRAVENOUS

## 2017-11-17 MED ORDER — PROPOFOL 10 MG/ML IV BOLUS
INTRAVENOUS | Status: AC
  Filled 2017-11-17: qty 20

## 2017-11-17 MED ORDER — CEFAZOLIN SODIUM-DEXTROSE 2-4 GM/100ML-% IV SOLN
2.0000 g | INTRAVENOUS | Status: AC
  Administered 2017-11-17: 2 g via INTRAVENOUS

## 2017-11-17 MED ORDER — LACTATED RINGERS IV SOLN
INTRAVENOUS | Status: DC
  Administered 2017-11-17 (×2): via INTRAVENOUS

## 2017-11-17 MED ORDER — ROCURONIUM BROMIDE 10 MG/ML (PF) SYRINGE
PREFILLED_SYRINGE | INTRAVENOUS | Status: DC | PRN
  Administered 2017-11-17: 40 mg via INTRAVENOUS

## 2017-11-17 MED ORDER — ACETAMINOPHEN 325 MG PO TABS: 325.0000 mg | ORAL_TABLET | ORAL | Status: DC | PRN

## 2017-11-17 MED ORDER — FENTANYL CITRATE (PF) 100 MCG/2ML IJ SOLN
25.0000 ug | INTRAMUSCULAR | Status: DC | PRN
  Administered 2017-11-17 (×3): 50 ug via INTRAVENOUS

## 2017-11-17 MED ORDER — OXYCODONE HCL 5 MG/5ML PO SOLN: 5.0000 mg | Freq: Once | ORAL | Status: DC | PRN

## 2017-11-17 MED ORDER — SODIUM CHLORIDE 0.9 % IV SOLN
INTRAVENOUS | Status: DC
  Administered 2017-11-17 – 2017-11-18 (×2): via INTRAVENOUS

## 2017-11-17 MED ORDER — ACETAMINOPHEN 325 MG PO TABS: 650.0000 mg | ORAL_TABLET | ORAL | Status: DC | PRN

## 2017-11-17 MED ORDER — SODIUM CHLORIDE 0.9 % IR SOLN
Status: DC | PRN
  Administered 2017-11-17: 6000 mL via INTRAVESICAL

## 2017-11-17 MED ORDER — LIDOCAINE 2% (20 MG/ML) 5 ML SYRINGE
INTRAMUSCULAR | Status: DC | PRN
  Administered 2017-11-17: 50 mg via INTRAVENOUS

## 2017-11-17 MED ORDER — CEFAZOLIN SODIUM-DEXTROSE 1-4 GM/50ML-% IV SOLN
1.0000 g | Freq: Three times a day (TID) | INTRAVENOUS | Status: AC
  Administered 2017-11-17: 1 g via INTRAVENOUS
  Filled 2017-11-17: qty 50

## 2017-11-17 MED ORDER — PHENYLEPHRINE 40 MCG/ML (10ML) SYRINGE FOR IV PUSH (FOR BLOOD PRESSURE SUPPORT)
PREFILLED_SYRINGE | INTRAVENOUS | Status: DC | PRN
  Administered 2017-11-17: 40 ug via INTRAVENOUS
  Administered 2017-11-17: 80 ug via INTRAVENOUS

## 2017-11-17 MED ORDER — SUGAMMADEX SODIUM 200 MG/2ML IV SOLN
INTRAVENOUS | Status: DC | PRN
  Administered 2017-11-17: 200 mg via INTRAVENOUS

## 2017-11-17 MED ORDER — LIDOCAINE HCL 1 % IJ SOLN
INTRAMUSCULAR | Status: AC
  Filled 2017-11-17: qty 20

## 2017-11-17 MED ORDER — PHENYLEPHRINE HCL 10 MG/ML IJ SOLN
INTRAVENOUS | Status: DC | PRN
  Administered 2017-11-17: 40 ug/min via INTRAVENOUS

## 2017-11-17 MED ORDER — HYDROMORPHONE HCL 1 MG/ML IJ SOLN
0.5000 mg | INTRAMUSCULAR | Status: DC | PRN
  Administered 2017-11-17: 0.5 mg via INTRAVENOUS
  Administered 2017-11-18: 1 mg via INTRAVENOUS
  Filled 2017-11-17 (×3): qty 1

## 2017-11-17 MED ORDER — FENTANYL CITRATE (PF) 100 MCG/2ML IJ SOLN: 25.0000 ug | INTRAMUSCULAR | Status: DC | PRN

## 2017-11-17 MED ORDER — HEMOSTATIC AGENTS (NO CHARGE) OPTIME
TOPICAL | Status: DC | PRN
  Administered 2017-11-17: 1

## 2017-11-17 MED ORDER — 0.9 % SODIUM CHLORIDE (POUR BTL) OPTIME
TOPICAL | Status: DC | PRN
  Administered 2017-11-17: 1000 mL

## 2017-11-17 MED ORDER — OXYCODONE-ACETAMINOPHEN 5-325 MG PO TABS: 1.0000 | ORAL_TABLET | ORAL | Status: DC | PRN

## 2017-11-17 MED ORDER — LIDOCAINE HCL (PF) 1 % IJ SOLN
INTRAMUSCULAR | Status: AC | PRN
  Administered 2017-11-17: 10:00:00

## 2017-11-17 MED ORDER — MIDAZOLAM HCL 2 MG/2ML IJ SOLN
INTRAMUSCULAR | Status: AC | PRN
  Administered 2017-11-17 (×3): 1 mg via INTRAVENOUS

## 2017-11-17 MED ORDER — LIDOCAINE-EPINEPHRINE (PF) 1 %-1:200000 IJ SOLN
INTRAMUSCULAR | Status: AC | PRN
  Administered 2017-11-17: 3 mL

## 2017-11-17 MED ORDER — DEXAMETHASONE SODIUM PHOSPHATE 10 MG/ML IJ SOLN
INTRAMUSCULAR | Status: DC | PRN
  Administered 2017-11-17: 10 mg via INTRAVENOUS

## 2017-11-17 MED ORDER — IOHEXOL 300 MG/ML  SOLN
INTRAMUSCULAR | Status: DC | PRN
  Administered 2017-11-17: 16 mL via URETHRAL

## 2017-11-17 MED ORDER — EPHEDRINE SULFATE-NACL 50-0.9 MG/10ML-% IV SOSY
PREFILLED_SYRINGE | INTRAVENOUS | Status: DC | PRN
  Administered 2017-11-17: 80 mg via INTRAVENOUS

## 2017-11-17 MED ORDER — MIDAZOLAM HCL 2 MG/2ML IJ SOLN
INTRAMUSCULAR | Status: AC
  Filled 2017-11-17: qty 4

## 2017-11-17 MED ORDER — SODIUM CHLORIDE 0.9 % IR SOLN
Status: DC | PRN
  Administered 2017-11-17: 12000 mL

## 2017-11-17 MED ORDER — FENTANYL CITRATE (PF) 100 MCG/2ML IJ SOLN
INTRAMUSCULAR | Status: AC | PRN
  Administered 2017-11-17 (×2): 50 ug via INTRAVENOUS

## 2017-11-17 MED ORDER — ONDANSETRON HCL 4 MG/2ML IJ SOLN: 4.0000 mg | INTRAMUSCULAR | Status: DC | PRN

## 2017-11-17 MED ORDER — CEFAZOLIN SODIUM-DEXTROSE 2-4 GM/100ML-% IV SOLN
INTRAVENOUS | Status: AC
  Filled 2017-11-17: qty 100

## 2017-11-17 MED ORDER — OXYCODONE HCL 5 MG/5ML PO SOLN
5.0000 mg | Freq: Once | ORAL | Status: DC | PRN
  Filled 2017-11-17: qty 5

## 2017-11-17 MED ORDER — FENTANYL CITRATE (PF) 250 MCG/5ML IJ SOLN
INTRAMUSCULAR | Status: AC
  Filled 2017-11-17: qty 5

## 2017-11-17 MED ORDER — CEFAZOLIN SODIUM-DEXTROSE 2-4 GM/100ML-% IV SOLN: 2.0000 g | Freq: Once | INTRAVENOUS | Status: DC

## 2017-11-17 MED ORDER — ENSURE ENLIVE PO LIQD: 237.0000 mL | Freq: Two times a day (BID) | ORAL | Status: DC

## 2017-11-17 SURGICAL SUPPLY — 62 items
APPLICATOR SURGIFLO ENDO (HEMOSTASIS) ×3 IMPLANT
BAG URINE DRAINAGE (UROLOGICAL SUPPLIES) ×3 IMPLANT
BAG URO CATCHER STRL LF (MISCELLANEOUS) ×3 IMPLANT
BASKET DAKOTA 1.9FR 11X120 (BASKET) IMPLANT
BASKET ZERO TIP 1.9FR (BASKET) ×3 IMPLANT
BASKET ZERO TIP NITINOL 2.4FR (BASKET) IMPLANT
BENZOIN TINCTURE PRP APPL 2/3 (GAUZE/BANDAGES/DRESSINGS) ×3 IMPLANT
BLADE SURG 15 STRL LF DISP TIS (BLADE) ×2 IMPLANT
BLADE SURG 15 STRL SS (BLADE) ×1
CATH FOLEY 2W COUNCIL 20FR 5CC (CATHETERS) IMPLANT
CATH FOLEY 2WAY SLVR  5CC 16FR (CATHETERS) ×1
CATH FOLEY 2WAY SLVR  5CC 18FR (CATHETERS)
CATH FOLEY 2WAY SLVR 5CC 16FR (CATHETERS) ×2 IMPLANT
CATH FOLEY 2WAY SLVR 5CC 18FR (CATHETERS) IMPLANT
CATH IMAGER II 65CM (CATHETERS) IMPLANT
CATH INTERMIT  6FR 70CM (CATHETERS) ×3 IMPLANT
CATH X-FORCE N30 NEPHROSTOMY (TUBING) ×3 IMPLANT
CLOTH BEACON ORANGE TIMEOUT ST (SAFETY) IMPLANT
COVER FOOTSWITCH UNIV (MISCELLANEOUS) ×6 IMPLANT
COVER SURGICAL LIGHT HANDLE (MISCELLANEOUS) ×3 IMPLANT
DRAPE C-ARM 42X120 X-RAY (DRAPES) ×3 IMPLANT
DRAPE LINGEMAN PERC (DRAPES) ×3 IMPLANT
DRAPE SURG IRRIG POUCH 19X23 (DRAPES) ×3 IMPLANT
DRSG PAD ABDOMINAL 8X10 ST (GAUZE/BANDAGES/DRESSINGS) IMPLANT
DRSG TEGADERM 4X4.75 (GAUZE/BANDAGES/DRESSINGS) ×3 IMPLANT
DRSG TEGADERM 8X12 (GAUZE/BANDAGES/DRESSINGS) IMPLANT
FIBER LASER TRAC TIP (UROLOGICAL SUPPLIES) ×3 IMPLANT
FLOSEAL 10ML (HEMOSTASIS) ×3 IMPLANT
GAUZE SPONGE 4X4 12PLY STRL (GAUZE/BANDAGES/DRESSINGS) IMPLANT
GLOVE BIOGEL M 8.0 STRL (GLOVE) ×3 IMPLANT
GOWN STRL REUS W/TWL XL LVL3 (GOWN DISPOSABLE) ×3 IMPLANT
GUIDEWIRE AMPLAZ .035X145 (WIRE) ×9 IMPLANT
GUIDEWIRE ANG ZIPWIRE 038X150 (WIRE) IMPLANT
GUIDEWIRE STR DUAL SENSOR (WIRE) ×3 IMPLANT
HOLDER FOLEY CATH W/STRAP (MISCELLANEOUS) ×3 IMPLANT
IV NS 1000ML (IV SOLUTION)
IV NS 1000ML BAXH (IV SOLUTION) IMPLANT
KIT BASIN OR (CUSTOM PROCEDURE TRAY) ×3 IMPLANT
MANIFOLD NEPTUNE II (INSTRUMENTS) ×6 IMPLANT
NS IRRIG 1000ML POUR BTL (IV SOLUTION) ×3 IMPLANT
PACK CYSTO (CUSTOM PROCEDURE TRAY) ×3 IMPLANT
PROBE LITHOCLAST ULTRA 3.8X403 (UROLOGICAL SUPPLIES) ×3 IMPLANT
PROBE PNEUMATIC 1.0MMX570MM (UROLOGICAL SUPPLIES) ×3 IMPLANT
SHEATH ACCESS URETERAL 24CM (SHEATH) ×3 IMPLANT
SHEATH ACCESS URETERAL 54CM (SHEATH) IMPLANT
SHEATH PEELAWAY SET 9 (SHEATH) ×3 IMPLANT
SHEATH URETERAL 12FRX35CM (MISCELLANEOUS) IMPLANT
SOL PREP PROV IODINE SCRUB 4OZ (MISCELLANEOUS) ×6 IMPLANT
STENT URET 6FRX24 CONTOUR (STENTS) ×3 IMPLANT
STONE CATCHER W/TUBE ADAPTER (UROLOGICAL SUPPLIES) ×3 IMPLANT
SURGIFLO W/THROMBIN 8M KIT (HEMOSTASIS) IMPLANT
SUT MNCRL AB 4-0 PS2 18 (SUTURE) ×3 IMPLANT
SUT SILK 2 0 30  PSL (SUTURE)
SUT SILK 2 0 30 PSL (SUTURE) IMPLANT
SYR 10ML LL (SYRINGE) ×3 IMPLANT
SYR 20CC LL (SYRINGE) ×6 IMPLANT
TOWEL OR 17X26 10 PK STRL BLUE (TOWEL DISPOSABLE) ×3 IMPLANT
TOWEL OR NON WOVEN STRL DISP B (DISPOSABLE) ×3 IMPLANT
TRAY FOLEY W/METER SILVER 16FR (SET/KITS/TRAYS/PACK) ×3 IMPLANT
TUBING CONNECTING 10 (TUBING) ×9 IMPLANT
TUBING UROLOGY SET (TUBING) ×3 IMPLANT
WATER STERILE IRR 500ML POUR (IV SOLUTION) ×3 IMPLANT

## 2017-11-17 NOTE — Anesthesia Procedure Notes (Signed)
Procedure Name: Intubation Date/Time: 11/17/2017 11:53 AM Performed by: Anne Fu, CRNA Pre-anesthesia Checklist: Patient identified, Emergency Drugs available, Suction available, Patient being monitored and Timeout performed Patient Re-evaluated:Patient Re-evaluated prior to induction Oxygen Delivery Method: Circle system utilized Preoxygenation: Pre-oxygenation with 100% oxygen Induction Type: IV induction Ventilation: Mask ventilation without difficulty Laryngoscope Size: Mac and 3 Grade View: Grade II Tube type: Oral Tube size: 7.5 mm Number of attempts: 1 Airway Equipment and Method: Stylet Placement Confirmation: ETT inserted through vocal cords under direct vision,  positive ETCO2 and breath sounds checked- equal and bilateral Secured at: 19 cm Tube secured with: Tape Dental Injury: Teeth and Oropharynx as per pre-operative assessment

## 2017-11-17 NOTE — H&P (Signed)
Referring Physician(s): Penny Pia  Supervising Physician: Sandi Mariscal  Patient Status:  [facility] OP TBA  Chief Complaint:  Right ureteral stone  Subjective: Patient familiar to IR service from prior thoracenteses in 2014, bone marrow biopsy in June 2018 as well as Port-A-Cath placement on 02/24/17.  She has a prior history of non-Hodgkin's lymphoma.  She also has a history of right ureteral stone with prior double-J stent placement in June of last year, since removed.  She presents today for right percutaneous nephrostomy prior to nephrolithotomy .  She is also on Eliquis for atrial fibrillation with last dose few days ago.  Additional medical history as listed below.  She currently denies fever, headache, chest pain, dyspnea, abdominal pain, nausea, vomiting or bleeding.  She does have occasional cough and some intermittent back pain.  Past Medical History:  Diagnosis Date  . Anemia   . Arthritis    osteoarthritis - knees , history of  . Atrial fibrillation [facility])    ablation- 2x's-- 1st time- [facility], 2nd event at [facility] in 2008. Convergent ablation at [facility] 6/14  . Atrial fibrillation ([place])   . Blood transfusion without reported diagnosis   . Breast cancer ([place])    Dr Margot Chimes, total thyroidectomy- 1999- for cancer  . Chronic bilateral pleural effusions   . Colon polyp    Dr Earlean Shawl  . Complete heart block ([place])   . Complication of anesthesia    Ketamine produces LSD reaction, bright colored nightmarish experience   . Dyslipidemia   . Dyspnea   . H/O pleural effusion    s/p thoracentesis w 3260m withdrawn  . Hepatitis    [place]patitis   . History of dysphagia    due to radiation therapy  . History of hiatal hernia    small noted on PET scan  . History of kidney stones   . Hx of thyroid cancer    Dr SForde Dandy . Hyperlipidemia   . Hypothyroidism   . Lung cancer, lower lobe ([place] 01[place]   radiation RX completed 03/04/17; will start  chemo 6/27, pt unaware of lung cancer  . Morbid obesity ([place]    Status post lap band surgery  . Non Hodgkin's lymphoma ([place]    on chemotherapy  . Persistent atrial fibrillation ([facility]    a. s/p PVI 2008 b. s/p convergent ablation 23536complicated by bradycardia requiring pacemaker implant  . Presence of permanent cardiac pacemaker   . Sinus node dysfunction ([facility]    Complicating convergent ablation 6/14  . Stroke ([facility]    [id number]  . SVC syndrome    Past Surgical History:  Procedure Laterality Date  . ABDOMINAL HYSTERECTOMY  1983  . afib ablation     a. 2008 PVI b. 2014 convergent ablation  . APPENDECTOMY    . BONE MARROW BIOPSY  02/21/2017  . BREAST LUMPECTOMY  2010  . bso  1998  . CARDIOVERSION  10/09/2012   Procedure: CARDIOVERSION;  Surgeon: JMinus Breeding MD;  [place]  Service: Cardiovascular;  Laterality: N/A;  . CARDIOVERSION  10/09/2012   Procedure: CARDIOVERSION;  Surgeon: JMinus Breeding MD;  [facility];  Service: Cardiovascular;  Laterality: N/A;  [name] the ok to add pt to the add on , but we must check to find out if the can add pt on at 1400 ( [phone])  . CARDIOVERSION N/A 11/20/2012   Procedure: CARDIOVERSION;  Surgeon: PFay Records MD;  Location: [place];  Service: Cardiovascular;  Laterality: N/A;  . CARDIOVERSION N/A 07/18/2017   Procedure: CARDIOVERSION;  Surgeon: Thayer Headings, MD;  [facility] ENDOSCOPY;  Service: Cardiovascular;  Laterality: N/A;  . CARDIOVERSION N/A 10/03/2017   Procedure: CARDIOVERSION;  Surgeon: Sanda Klein, MD;  Location: [facility] ENDOSCOPY;  Service: Cardiovascular;  Laterality: N/A;  . CHOLECYSTECTOMY    . COLONOSCOPY W/ POLYPECTOMY     Dr Earlean Shawl  . CYSTOSCOPY N/A 02/06/2015   Procedure: CYSTOSCOPY;  Surgeon: Kathie Rhodes, MD;  [facility];  Service: Urology;  Laterality: N/A;  . CYSTOSCOPY WITH RETROGRADE PYELOGRAM, URETEROSCOPY AND STENT PLACEMENT Right 02/06/2015   Procedure: RETROGRADE  PYELOGRAM, RIGHT URETEROSCOPY STENT PLACEMENT;  Surgeon: Kathie Rhodes, MD;  [facility];  Service: Urology;  Laterality: Right;  . CYSTOSCOPY WITH RETROGRADE PYELOGRAM, URETEROSCOPY AND STENT PLACEMENT Right 03/07/2017   Procedure: CYSTOSCOPY WITH RIGHT RETROGRADE PYELOGRAM,RIGHT  URETEROSCOPYLASER LITHOTRIPSY  AND STENT PLACEMENT AND STONE BASKETRY;  Surgeon: Kathie Rhodes, MD;  Location: [place];  Service: Urology;  Laterality: Right;  . EYE SURGERY     cataract surgery  . HOLMIUM LASER APPLICATION N/A 02/27/3709   Procedure: HOLMIUM LASER APPLICATION;  Surgeon: Kathie Rhodes, MD;  [facility];  Service: Urology;  Laterality: N/A;  . HOLMIUM LASER APPLICATION Right 03/18/9484   Procedure: HOLMIUM LASER APPLICATION;  Surgeon: Kathie Rhodes, MD;  Location: [facility];  Service: Urology;  Laterality: Right;  . IR FLUORO GUIDE PORT INSERTION RIGHT  02/24/2017  . IR PATIENT EVAL TECH 0-60 MINS  03/11/2017  . IR US GUIDE VASC ACCESS RIGHT  02/24/2017  . KNEE ARTHROSCOPY     bilateral  . LAPAROSCOPIC GASTRIC BANDING  07/10/2010  . LAPAROTOMY     for ruptured ovary and ovarian artery   . PACEMAKER INSERTION  03/10/2013   MDT dual chamber PPM  . POCKET REVISION N/A 12/08/2013   Procedure: POCKET REVISION;  Surgeon: Deboraha Sprang, MD;  Location[facility] C[facility] LAB;  Service: Cardiovascular;  Laterality: N/A;  . PORTA CATH INSERTION    . TEE WITH CARDIOVERSION  09/22/2017  . TEE WITHOUT CARDIOVERSION N/A 10/03/2017   Procedure: TRANSESOPHAGEAL ECHOCARDIOGRAM (TEE);  Surgeon: Sanda Klein, MD;  Location: [place];  Service: Cardiovascular;  Laterality: N/A;  . THYROIDECTOMY  1998   Dr Margot Chimes  . TONSILLECTOMY    . TOTAL KNEE ARTHROPLASTY  04/13/2012   Procedure: TOTAL KNEE ARTHROPLASTY;  Surgeon: Rudean Haskell, MD;  Location: [place];  Service: Orthopedics;  Laterality: Right;  Marland Kitchen VIDEO BRONCHOSCOPY WITH ENDOBRONCHIAL ULTRASOUND N/A 02/07/2017   Procedure: VIDEO  BRONCHOSCOPY WITH ENDOBRONCHIAL ULTRASOUND;  Surgeon: Marshell Garfinkel, MD;  Location: [place];  Service: Pulmonary;  Laterality: N/A;      Allergies: Tikosyn [dofetilide]; Xarelto [rivaroxaban]; Epinephrine; Ketamine; Oral anesthetic [benzocaine]; and Adhesive [tape]  Medications: Prior to Admission medications   Medication Sig Start Date End Date Taking? Authorizing Provider  amiodarone (PACERONE) 200 MG tablet Take 1 tablet (200 mg total) by mouth daily. 09/19/17  Yes Deboraha Sprang, MD  cycloSPORINE (RESTASIS) 0.05 % ophthalmic emulsion Place 1 drop into both eyes 2 (two) times daily.   Yes [provider]  diphenhydramine-acetaminophen (TYLENOL PM) 25-500 MG TABS tablet Take 2 tablets by mouth at bedtime.    Yes [provider]  ELIQUIS 5 MG TABS tablet TAKE 1 TABLET BY MOUTH TWO  TIMES DAILY Patient not taking: Reported on 11/12/2017 08/26/17  Yes Deboraha Sprang, MD  levothyroxine (  SYNTHROID, LEVOTHROID) 175 MCG tablet Take 175 mcg by mouth See admin instructions. TAKE 1 TABLET (175 MCG) BY MOUTH DAILY, EXCEPT FRIDAYS.   Yes [provider]  Lifitegrast Shirley Friar) 5 % SOLN Apply 1 drop to eye 2 (two) times daily.   Yes [provider]  fluticasone (FLONASE) 50 MCG/ACT nasal spray Place 1 spray into both nostrils daily. Patient not taking: Reported on 11/04/2017 10/06/17   Chesley Mires, MD  furosemide (LASIX) 20 MG tablet Take 1 tablets daily till you see your cardiolgist Patient taking differently: Take 20 mg by mouth daily as needed for fluid or edema.  09/17/17   Bonnielee Haff, MD  levothyroxine (SYNTHROID, LEVOTHROID) 137 MCG tablet Take 1 tablet (137 mcg total) by mouth daily before breakfast. Patient not taking: Reported on 11/04/2017 09/17/17   Bonnielee Haff, MD  Multiple Vitamin (MULTIVITAMIN) tablet Take 1 tablet by mouth daily.     [provider]  oxyCODONE-acetaminophen (PERCOCET/ROXICET) 5-325 MG tablet Take 1 tablet by mouth daily  as needed for severe pain.    [provider]     Vital Signs: BP 124/78 (BP Location: Right Arm)   Pulse 85   Temp 97.8 F (36.6 C) (Oral)   Resp 16   Ht '5\' 6"'$  (1.676 m)   Wt 200 lb 6.4 oz (90.9 kg)   SpO2 100%   BMI 32.35 kg/m   Physical Exam awake, alert.  Chest clear to auscultation bilaterally.  Clean, intact right chest wall Port-A-Cath.  Left chest wall ICD.  Heart with regular rate and rhythm.  Abdomen soft, positive bowel sounds, nontender.  No lower extremity edema.  Imaging: No results found.  Labs:  CBC: Recent Labs    09/17/17 0430 09/26/17 1134 11/12/17 1036 11/17/17 0755  WBC 4.8 3.4* 4.9 4.1  HGB 10.0* 11.3* 11.8* 11.7*  HCT 31.4* 36.2 37.5 36.7  PLT 152 148* 168 171    COAGS: Recent Labs    02/07/17 0317  04/22/17 0944 04/28/17 1044 09/16/17 0420 09/26/17 1134  INR 1.39   < > 1.9 2.2 1.46 1.18  APTT 40*  --   --   --   --   --    < > = values in this interval not displayed.    BMP: Recent Labs    09/16/17 1149 09/17/17 0430 09/26/17 1134 11/12/17 1036  NA 136 136 138 142  K 3.6 3.6 4.1 4.4  CL 105 104 109 108  CO2 '24 23 25 25  '$ GLUCOSE 131* 139* 94 103*  BUN '10 13 16 '$ 21*  CALCIUM 8.9 8.8* 8.9 9.3  CREATININE 1.00 1.05* 0.91 1.03*  GFRNONAA 55* 51* >60 53*  GFRAA >60 60* >60 >60    LIVER FUNCTION TESTS: Recent Labs    07/29/17 1414 08/28/17 0946 09/15/17 0928 09/16/17 0420  BILITOT 0.43 0.89 1.2 1.1  AST '13 23 16 17  '$ ALT '10 18 15 14  '$ ALKPHOS 81 71 62 67  PROT 5.9* 6.1* 5.5* 5.4*  ALBUMIN 2.6* 3.3* 3.0* 3.0*    Assessment and Plan:  Pt with prior history of non-Hodgkin's lymphoma.  She also has a history of right flank pain, right ureteral stone with prior double-J stent placement in June of last year, since removed.  She presents today for right percutaneous nephrostomy prior to nephrolithotomy . Risks and benefits of nephrostomy were discussed with the patient including, but not limited to, infection,  bleeding, significant bleeding causing loss or decrease in renal function or damage to  adjacent structures     Electronically Signed: D. Rowe Robert, PA-C 11/17/2017, 8:24 AM   I spent a total of 25 minutes at the the patient's bedside AND on the patient's hospital floor or unit, greater than 50% of which was counseling/coordinating care for right nephroureteral catheter placement

## 2017-11-17 NOTE — Anesthesia Preprocedure Evaluation (Signed)
Anesthesia Evaluation  Patient identified by MRN, date of birth, ID band Patient awake    Reviewed: Allergy & Precautions, NPO status , Patient's Chart, lab work & pertinent test results  History of Anesthesia Complications Negative for: history of anesthetic complications  Airway Mallampati: II  TM Distance: <3 FB Neck ROM: Full  Mouth opening: Limited Mouth Opening  Dental  (+) Dental Advisory Given   Pulmonary shortness of breath, sleep apnea ,  Non hodgkin lymphoma c mediastiam mass around trachea   breath sounds clear to auscultation       Cardiovascular hypertension, Pt. on home beta blockers + Peripheral Vascular Disease and +CHF  + dysrhythmias + pacemaker  Rhythm:Irregular Rate:Normal  Recent ECHO noted   Neuro/Psych CVA negative psych ROS   GI/Hepatic hiatal hernia, (+) Hepatitis -  Endo/Other  Morbid obesity  Renal/GU Renal disease     Musculoskeletal  (+) Arthritis ,   Abdominal   Peds  Hematology  (+) anemia , Lymphoma   Anesthesia Other Findings   Reproductive/Obstetrics                             Anesthesia Physical Anesthesia Plan  ASA: III  Anesthesia Plan: General   Post-op Pain Management:    Induction: Intravenous  PONV Risk Score and Plan: 3 and Ondansetron and Dexamethasone  Airway Management Planned: Oral ETT  Additional Equipment: None  Intra-op Plan:   Post-operative Plan: Extubation in OR  Informed Consent: I have reviewed the patients History and Physical, chart, labs and discussed the procedure including the risks, benefits and alternatives for the proposed anesthesia with the patient or authorized representative who has indicated his/her understanding and acceptance.   Dental advisory given  Plan Discussed with: CRNA and Surgeon  Anesthesia Plan Comments:         Anesthesia Quick Evaluation

## 2017-11-17 NOTE — Anesthesia Postprocedure Evaluation (Signed)
Anesthesia Post Note  Patient: Kayla Harrison  Procedure(s) Performed: NEPHROLITHOTOMY PERCUTANEOUS (Right ) CYSTOSCOPY WITH RETROGRADE /PYELOGRAM/ (Left ) HOLMIUM LASER APPLICATION (Left )     Patient location during evaluation: PACU Anesthesia Type: General Level of consciousness: awake and alert Pain management: pain level controlled Vital Signs Assessment: post-procedure vital signs reviewed and stable Respiratory status: spontaneous breathing, nonlabored ventilation and respiratory function stable Cardiovascular status: blood pressure returned to baseline and stable Postop Assessment: no apparent nausea or vomiting Anesthetic complications: no    Last Vitals:  Vitals:   11/17/17 1515 11/17/17 1537  BP: 121/68 (!) 148/80  Pulse: 72 77  Resp: (!) 21 16  Temp: (!) 36.3 C (!) 36.4 C  SpO2: 100% 100%    Last Pain:  Vitals:   11/17/17 1515  TempSrc:   PainSc: Asleep                 Lynda Rainwater

## 2017-11-17 NOTE — Transfer of Care (Signed)
Immediate Anesthesia Transfer of Care Note  Patient: Kayla Harrison  Procedure(s) Performed: Procedure(s): NEPHROLITHOTOMY PERCUTANEOUS (Right) CYSTOSCOPY WITH RETROGRADE /PYELOGRAM/ (Left) HOLMIUM LASER APPLICATION (Left)  Patient Location: PACU  Anesthesia Type:General  Level of Consciousness:  sedated, patient cooperative and responds to stimulation  Airway & Oxygen Therapy:Patient Spontanous Breathing and Patient connected to face mask oxgen  Post-op Assessment:  Report given to PACU RN and Post -op Vital signs reviewed and stable  Post vital signs:  Reviewed and stable  Last Vitals:  Vitals:   11/17/17 0741  BP: 124/78  Pulse: 85  Resp: 16  Temp: 36.6 C  SpO2: 798%    Complications: No apparent anesthesia complications

## 2017-11-17 NOTE — Op Note (Signed)
PATIENT:  Kayla Harrison  PRE-OPERATIVE DIAGNOSIS: 1.  2.1 cm right UPJ stone. 2.  Left ureteral calculi  POST-OPERATIVE DIAGNOSIS: Same  PROCEDURE: 1. Percutaneous nephrostomy sheath placement. 2.  Right percutaneous nephrolithotomy (2.1cm.) 3. Antegrade double-J stent placement. 4.  Left retrograde pyelogram with interpretation. 5.  Left ureteroscopy, laser lithotripsy and stone extraction. 6.  Fluoroscopy time greater than 2 hours and less than 3.  SURGEON:  Claybon Jabs  INDICATION: Kayla Harrison is a 72 year old female with a known right renal pelvic stone that had not caused any obstruction for many years but on a recent PET scan she was found to have the stone located slightly distal at the ureteropelvic junction causing mild hydronephrosis.  She had a known history of left renal calculi and she had 2 stones that had migrated into the proximal left ureter at that time but these were causing no evidence of hydronephrosis.  We therefore have discussed proceeding with right percutaneous nephrolithotomy and if all goes well I wanted to address her left ureteral stones with ureteroscopy and laser lithotripsy however she is quite adverse to having a stent placed.  She does realize that I will either need to leave a stent on the right-hand side at the end of her procedure or leave a nephrostomy tube draining her kidney and she has elected to proceed with a stent if that is appropriate.  I told her on the left-hand side I would have to determine whether stent placement alone was how I felt I should proceed versus some other form of therapy such as laser lithotripsy. ANESTHESIA:  General  EBL: 50 cc  DRAINS: 6 French, 24 cm double-J stent in the right ureter (no string)  LOCAL MEDICATIONS USED:  None  SPECIMEN: Stone given to patient  Description of procedure: After informed consent the patient was taken to the operating room and administered general endotracheal anesthesia.  Once fully anesthetized the patient had an 55 French Foley catheter placed It was then moved from the stretcher onto the operating room table in a prone position with bony prominences padded and chest pads in place. The flank with exiting nephrostomy catheter was then sterilely prepped and draped in standard fashion. An official timeout was then performed.  Using the existing nephrostomy catheter access I passed a 0.038 inch floppy tipped guidewire down the ureter into the bladder under fluoroscopy.  This was left in place and the nephrostomy catheter was removed.  A transverse incision was made over the guidewire and a peel-away coaxial catheter was then passed over the guidewire and down the ureter under fluoroscopy.  The inner portion of the coaxial system was then removed and a second guidewire was passed through this catheter and down the ureter into the bladder under fluoroscopy.  The coaxial catheter was then removed and one of the guidewires was secured to the drape as a safety guidewire and the second guidewire was used as a working guidewire.  The NephroMax nephrostomy dilating balloon was then passed over the working guidewire into the area of the renal pelvis under fluoroscopy.  It was then inflated using dilute contrast under fluoroscopy until the balloon was fully inflated.  I then passed the 28 French nephrostomy access sheath over the balloon into the area of the renal pelvis under fluoroscopy and then deflated the balloon and removed the dilating balloon.  The 12 French rigid nephroscope was then passed under direct vision through the nephrostomy access sheath.  Clotted blood was evacuated and  the stone was identified.  I used the Electrical engineer to fragment the stone and evacuated simultaneously.  As portions of the stone began to break free I then used 2 prong graspers to extract these fragments.  This proceeded until all obvious stone had been completely removed from the renal pelvic  region.  I then switched to the flexible cystoscope and was able to identify it to stone fragments that had migrated down the ureter slightly and with irrigation these were flushed back into the renal pelvis with no further stone seen down the ureter.  I then evaluated all portions of the kidney and was unable to identify any further stone fragments so I reinserted the rigid scope and used the ultrasound to remove the remaining fragments that I had flushed back into the renal pelvis.  The procedure proceeded with minimal bleeding and therefore I elected to place a double-J stent and backloaded the nephroscope over the guidewire and then passed a stent down the guidewire into the area of the bladder.  As I began to remove the guidewire and noted good curl in the bladder and good curl was then visualized in the renal pelvis as well.  I evacuated fluid and measured the depth to the renal parenchyma and then injected FloSeal through the nephrostomy sheath using a laparoscopic introducer at the measured depth of the parenchyma and into the nephrostomy tract as I removed the sheath.  I then closed the skin with a running subcuticular 4-0 Monocryl suture.  Dry causing and a Tegaderm were then applied.  The patient was then moved to an alternate table in the supine position and then moved into the dorsal lithotomy position.  Her genitalia was sterilely prepped and draped and I then proceeded to pass a 21 French cystoscope with 30 degree lens into the bladder.  The stent was noted exiting the right ureteral orifice.  There were no tumors, stones or inflammatory lesions within the bladder.  A 6 French open-ended ureteral catheter was then passed through the cystoscope and into the left ureteral orifice.  I injected half strength Omnipaque contrast up the left ureter under direct fluoroscopy.  This revealed 2 filling defects in the distal left ureter with dilation of the ureter both proximal and distal to the filling  defects noted.  The rest of the ureter appeared dilated but I saw no further filling defects.  The intrarenal collecting system also was mild to moderately dilated.  I then passed a 0.038 inch sensor guidewire through the open-ended catheter and up into the renal pelvis under fluoroscopy and this was left in place.  I used the inner portion of the ureteral access sheath to dilate the intramural ureter and then shows a 5 French rigid ureteroscope for ureteroscopy.  The ureteroscope was passed under direct vision into the bladder and next to the guidewire.  As I passed the scope through the intramural ureter I noted no  abnormality of the mucosa but just beyond the intramural ureter I noted a dilated ureter and advanced the scope to the level where the filling defects had been identified on her retrograde pyelogram and I noted stones in this location.  I therefore chose a 200 m holmium laser fiber and began to fragment the stones.  As the stones fragmented I intermittently used a 0 tip nitinol basket to extract the fragments.  This proceeded until all of the stone was completely fragmented and removed from the ureter.  Once this was achieved I advanced the  scope up the ureter under direct vision all the way to just below the renal pelvis and injected contrast again under fluoroscopy revealing no filling defects or other abnormalities in the proximal ureter and I therefore felt all of her left ureteral stones had been addressed completely.  Because of her very dilated ureter and the fact that the procedure was minimally traumatic I felt she could safely forego a stent on the left-hand side and therefore I drained the bladder and removed the cystoscope and inserted a 16 French Foley catheter inflating the balloon and connecting this to closed system drainage.  The patient was awakened and taken to the recovery room in stable and satisfactory condition.  She tolerated the procedure well and there were no  intraoperative complications.    PLAN OF CARE: Discharge to home after an overnight stay.  PATIENT DISPOSITION:  PACU - hemodynamically stable.

## 2017-11-17 NOTE — Procedures (Signed)
Pre Procedure Dx: Right sided nephrolithiasis Post Procedural Dx: Same  Successful [place] and fluoroscopic guided placement of a right sided catheter to the level of the urinary bladder to be utilized during impending PCNL.  EBL: None  Complications: None immediate.  Ronny Bacon, MD Pager #: [phone]

## 2017-11-18 DIAGNOSIS — J90 Pleural effusion, not elsewhere classified: Secondary | ICD-10-CM | POA: Diagnosis not present

## 2017-11-18 DIAGNOSIS — I481 Persistent atrial fibrillation: Secondary | ICD-10-CM | POA: Diagnosis not present

## 2017-11-18 DIAGNOSIS — I110 Hypertensive heart disease with heart failure: Secondary | ICD-10-CM | POA: Diagnosis not present

## 2017-11-18 DIAGNOSIS — N132 Hydronephrosis with renal and ureteral calculous obstruction: Secondary | ICD-10-CM | POA: Diagnosis not present

## 2017-11-18 DIAGNOSIS — E039 Hypothyroidism, unspecified: Secondary | ICD-10-CM | POA: Diagnosis not present

## 2017-11-18 DIAGNOSIS — E785 Hyperlipidemia, unspecified: Secondary | ICD-10-CM | POA: Diagnosis not present

## 2017-11-18 MED ORDER — ZOLPIDEM TARTRATE 5 MG PO TABS: 5.0000 mg | ORAL_TABLET | Freq: Every evening | ORAL | Status: DC | PRN

## 2017-11-18 MED ORDER — MIRABEGRON ER 50 MG PO TB24: 50.0000 mg | ORAL_TABLET | Freq: Every day | ORAL | 0 refills | Status: DC

## 2017-11-18 MED ORDER — HYDROCODONE-ACETAMINOPHEN 10-325 MG PO TABS: 1.0000 | ORAL_TABLET | ORAL | 0 refills | Status: DC | PRN

## 2017-11-18 NOTE — Discharge Summary (Signed)
Physician Discharge Summary      Patient ID: Kayla Harrison MRN: [id number] DOB/AGE: 1946/04/26 72 y.o.  Admit date: 11/17/2017 Discharge date: 11/18/2017  Admission Diagnoses: RIGHT RENAL CALCULUS, LEFT URETERAL STONE  Discharge Diagnoses:  Active Problems:   Bilateral ureteral calculi   Discharged Condition: good  Hospital Course: She was admitted to the hospital for elective right percutaneous nephrolithotomy and left ureteroscopy with possible stent versus laser lithotripsy.  Her right renal pelvic/UPJ stone was treated percutaneously with a good result and no difficulties during the operation and therefore she also underwent a left ureteroscopy with the finding of the stones that were previously located in the upper ureter in the lower ureter.  These were treated with laser lithotripsy and due to her strong desire to not have a stent on both sides and the ease with which the procedure went on the left side I elected not to leave a stent.  She has done well overnight.  Her follow-up CT scan revealed a 1 and 2 mm fragment present in her right kidney which are of no clinical significance and we will almost certainly pass spontaneously with some contrast still seen on the left-hand side indicating incomplete drainage although I think this will resolve given time.  She is tolerating regular diet and otherwise felt ready for discharge.  Urine is still slightly bloody but there are no clots.  I will have her hold her Eliquis for 48 hours and then resume this medication if her urine has cleared.  If it remains bloody she was instructed to contact me prior to restarting this medication.  Significant Diagnostic Studies: Ct Abdomen Wo Contrast  Result Date: 11/17/2017 CLINICAL DATA:  Evaluate for residual fragments after RIGHT percutaneous nephro LEFT lithotripsy EXAM: CT ABDOMEN WITHOUT CONTRAST TECHNIQUE: Multidetector CT imaging of the abdomen was performed following the standard protocol  without IV contrast. COMPARISON:  None. FINDINGS: Lower chest:  Small RIGHT effusion. Hepatobiliary: No focal hepatic lesion.  Postcholecystectomy Pancreas: Normal pancreatic parenchymal intensity. No ductal dilatation or inflammation. Spleen: Granulomata within the spleen Adrenals/urinary tract: Adrenal glands normal. Upper aspect of the double-J ureteral stent within the RIGHT renal collecting system. Tiny 2 mm upper pole calculi identified (coronal image 56). Tiny 1 mm lower pole calculi on image 47, series 5. There is residual contrast excreted in the RIGHT collecting system. No hydronephrosis. Perinephric stranding and small gas in the RIGHT pararenal fascia consistent recent procedure. There is hydronephrosis and hydroureter on the LEFT. Small gas within the LEFT ureter and collecting system. There is a 2 calculi within the LEFT kidney measuring 6 mm and 2 mm. Stomach/Bowel: Gastric banding device noted. No complication of the bowel. Vascular/Lymphatic: Abdominal aorta normal.  No abdominal adenopathy Musculoskeletal: No acute findings IMPRESSION: 1. Double-J ureteral stent in place on the RIGHT. No evidence obstruction. 2. Two tiny calculi within the RIGHT renal cortex. 3. LEFT hydronephrosis and hydroureter with retention of contrast. 4. LEFT nephrolithiasis. Electronically Signed   By: Suzy Bouchard M.D.   On: 11/17/2017 17:53   Dg C-arm 1-60 Min-no Report  Result Date: 11/17/2017 Fluoroscopy was utilized by the requesting physician.  No radiographic interpretation.   Dg C-arm 1-60 Min-no Report  Result Date: 11/17/2017 Fluoroscopy was utilized by the requesting physician.  No radiographic interpretation.   Ir Nephrostomy Placement Right  Result Date: 11/17/2017 INDICATION: Renal stones, access for right percutaneous nephrolithotomy. EXAM: 1. ULTRASOUND AND FLUOROSCOPIC GUIDED PUNCTURE OF THE RIGHT SIDED RENAL COLLECTING SYSTEM. Casar  OF A RIGHT SIDED NEPHROURETERAL  CATHETER. COMPARISON:  PET-CT - 08/27/2017 MEDICATIONS: Ancef 2 gm IV; The antibiotic was administered in an appropriate time frame prior to skin puncture. ANESTHESIA/SEDATION: Moderate (conscious) sedation was employed during this procedure. A total of Versed 3 mg and Fentanyl 100 mcg was administered intravenously. Moderate Sedation Time: 13 minutes. The patient's level of consciousness and vital signs were monitored continuously by radiology nursing throughout the procedure under my direct supervision. CONTRAST:  20 cc Isovue-300 - administered into the renal collecting system. FLUOROSCOPY TIME:  1 minute 12 seconds (18 mGy) COMPLICATIONS: None immediate. PROCEDURE: Informed written consent was obtained from the patient after a discussion of the risks, benefits, and alternatives to treatment. The flank flank region was prepped with Betadine in a sterile fashion, and a sterile drape was applied covering the operative field. A sterile gown and sterile gloves were used for the procedure. A timeout was performed prior to the initiation of the procedure. A pre procedural spot fluoroscopic image was obtained of the upper abdomen. Ultrasound scanning was performed of the right kidney demonstrated mild to moderate right-sided pelvicaliectasis. As such, a moderately dilated posterior inferior calyx was targeted with a Adrian needle under direct ultrasound guidance. Ultrasound images were saved for procedural documentation purposes. Access to the collecting system was confirmed with the injection of a small amount of contrast a passage of a Nitrex wire through the right renal collecting system and past the pelvic stone. An Accustick set was utilized to dilate the tract and was subsequently exchanged for a Kumpe catheter over a Bentson wire. The Kumpe catheter was advanced down the ureter and into the urinary bladder. Postprocedural spot radiographs were obtained in various obliquities and the catheter was sutured  to the skin. The catheter was capped and a dressing was placed. The patient tolerated the procedure well without immediate post procedural complication. FINDINGS: Pre procedural spot radiographic images demonstrates an approximately 1.6 x 1.2 cm stone overlying expected location of the caudal aspect of the right renal pelvis, similar to PET-CT performed 08/27/2017. Ultrasound scanning demonstrates mild to moderate right-sided pelvicaliectasis as such, a posterior inferior calyx was targeted with direct ultrasound guidance. This allowed for placement of a Kumpe catheter through this posterior inferior calyx past the pelvic stone to the level of the urinary bladder. IMPRESSION: Successful ultrasound and fluoroscopic guided placement of a right sided 5 [place] Kumpe catheter to the level of the urinary bladder to be utilized during impending nephrolithotomy procedure. Electronically Signed   By: Sandi Mariscal M.D.   On: 11/17/2017 10:56    Discharge Exam: Blood pressure 123/69, pulse 74, temperature 98 F (36.7 C), temperature source Oral, resp. rate 18, height 5\' 6"  (1.676 m), weight 90.9 kg (200 lb 6.4 oz), SpO2 95 %.  Alert, awake, oriented and in no apparent distress. Chest: Normal respiratory effort. Cardiovascular: Regular rate and rhythm Abdomen: Obese, soft and nontender. Back: Nephrostomy dressing dry and intact. GU: Foley catheter indwelling with slightly bloody urine.  No clots.  Disposition: 01-Home or Self Care   Allergies as of 11/18/2017      Reactions   Tikosyn [dofetilide] Other (See Comments)   Prolonged QT interval Prolonged QT interval   Xarelto [rivaroxaban] Other (See Comments)   Nose Bleed X 6 hrs ; packing in ER   Epinephrine Other (See Comments), Rash   Dental form only (liquid). Patient stated she will become "out of it" she can hear you but cannot respond in normal fashion.  Ketamine    Hallucinations   Oral Anesthetic [benzocaine]    Adhesive [tape] Rash   Paper  tape as well      Medication List    STOP taking these medications   fluticasone 50 MCG/ACT nasal spray Commonly known as:  FLONASE     TAKE these medications   amiodarone 200 MG tablet Commonly known as:  PACERONE Take 1 tablet (200 mg total) by mouth daily.   cycloSPORINE 0.05 % ophthalmic emulsion Commonly known as:  RESTASIS Place 1 drop into both eyes 2 (two) times daily.   diphenhydramine-acetaminophen 25-500 MG Tabs tablet Commonly known as:  TYLENOL PM Take 2 tablets by mouth at bedtime.   ELIQUIS 5 MG Tabs tablet Generic drug:  apixaban TAKE 1 TABLET BY MOUTH TWO  TIMES DAILY   furosemide 20 MG tablet Commonly known as:  LASIX Take 1 tablets daily till you see your cardiolgist What changed:    how much to take  how to take this  when to take this  reasons to take this  additional instructions   levothyroxine 175 MCG tablet Commonly known as:  SYNTHROID, LEVOTHROID Take 175 mcg by mouth See admin instructions. TAKE 1 TABLET (175 MCG) BY MOUTH DAILY, EXCEPT FRIDAYS. What changed:  Another medication with the same name was removed. Continue taking this medication, and follow the directions you see here.   multivitamin tablet Take 1 tablet by mouth daily.   oxyCODONE-acetaminophen 5-325 MG tablet Commonly known as:  PERCOCET/ROXICET Take 1 tablet by mouth daily as needed for severe pain.   XIIDRA 5 % Soln Generic drug:  Lifitegrast Apply 1 drop to eye 2 (two) times daily.      Follow-up Information    Call Kathie Rhodes, MD.   Specialty:  Urology Why:  For an appointment in ~1 week when you get home. Contact information: [address] [place] [address] [phone]           Signed: Claybon Jabs 11/18/2017, 3:56 AM

## 2017-11-18 NOTE — Plan of Care (Signed)
Discharge instructions reviewed with patient, questions answered, verbalized understanding.  Rx given for Norco, patient has percocet at home.  I advised patient that she should not take both of these, take one or the other as they are both narcotic pain medications.  Patient verbalized understanding.  Patient transported to front of hospital via wheelchair to be taken home by friend.  All belongings and prescriptions sent home with patient.

## 2017-11-21 ENCOUNTER — Encounter: Payer: Self-pay | Admitting: Internal Medicine

## 2017-11-21 DIAGNOSIS — C8592 Non-Hodgkin lymphoma, unspecified, intrathoracic lymph nodes: Secondary | ICD-10-CM | POA: Diagnosis not present

## 2017-11-21 DIAGNOSIS — I4891 Unspecified atrial fibrillation: Secondary | ICD-10-CM | POA: Diagnosis not present

## 2017-11-21 DIAGNOSIS — Z7901 Long term (current) use of anticoagulants: Secondary | ICD-10-CM | POA: Diagnosis not present

## 2017-11-21 DIAGNOSIS — M19011 Primary osteoarthritis, right shoulder: Secondary | ICD-10-CM | POA: Diagnosis not present

## 2017-11-21 DIAGNOSIS — Z79891 Long term (current) use of opiate analgesic: Secondary | ICD-10-CM | POA: Diagnosis not present

## 2017-11-21 NOTE — Telephone Encounter (Signed)
After receiving Ms Sebo's message, I contacted her for further assessment. She stated she is having "red, not pink urine" since starting back on her Eliquis. Per the DOD, Dr Burt Knack, she should hold her Eliquis for 48 hrs and look for change. I instructed her to call the office on Monday to discuss her progress and when/if she should hold her Eliquis for her stent removal on 3/5. She verbalized understanding and stated she would call back on Monday. She had no further questions or concerns.

## 2017-11-24 NOTE — Telephone Encounter (Signed)
11-24-17 called pt at 1038am to confirm her appt tomorrow with Caryl Comes. Pt stated she has resumed her Eliquis and does not have any visual hematuria.

## 2017-11-25 DIAGNOSIS — N200 Calculus of kidney: Secondary | ICD-10-CM | POA: Diagnosis not present

## 2017-11-26 ENCOUNTER — Ambulatory Visit: Payer: Medicare Other | Admitting: Hematology and Oncology

## 2017-11-26 ENCOUNTER — Other Ambulatory Visit: Payer: Medicare Other

## 2017-11-27 ENCOUNTER — Telehealth: Payer: Self-pay | Admitting: Hematology and Oncology

## 2017-11-27 ENCOUNTER — Inpatient Hospital Stay: Payer: Medicare Other | Attending: Hematology and Oncology | Admitting: Hematology and Oncology

## 2017-11-27 ENCOUNTER — Ambulatory Visit: Payer: Medicare Other | Admitting: Hematology and Oncology

## 2017-11-27 ENCOUNTER — Other Ambulatory Visit: Payer: Medicare Other

## 2017-11-27 ENCOUNTER — Inpatient Hospital Stay: Payer: Medicare Other

## 2017-11-27 VITALS — BP 118/69 | HR 77 | Temp 97.5°F | Resp 17 | Ht 66.0 in | Wt 200.8 lb

## 2017-11-27 DIAGNOSIS — I4892 Unspecified atrial flutter: Secondary | ICD-10-CM

## 2017-11-27 DIAGNOSIS — Z87442 Personal history of urinary calculi: Secondary | ICD-10-CM

## 2017-11-27 DIAGNOSIS — Z79899 Other long term (current) drug therapy: Secondary | ICD-10-CM

## 2017-11-27 DIAGNOSIS — Z9221 Personal history of antineoplastic chemotherapy: Secondary | ICD-10-CM | POA: Diagnosis not present

## 2017-11-27 DIAGNOSIS — Z7901 Long term (current) use of anticoagulants: Secondary | ICD-10-CM | POA: Diagnosis not present

## 2017-11-27 DIAGNOSIS — C8522 Mediastinal (thymic) large B-cell lymphoma, intrathoracic lymph nodes: Secondary | ICD-10-CM | POA: Diagnosis not present

## 2017-11-27 DIAGNOSIS — D649 Anemia, unspecified: Secondary | ICD-10-CM

## 2017-11-27 DIAGNOSIS — R0602 Shortness of breath: Secondary | ICD-10-CM

## 2017-11-27 LAB — CBC WITH DIFFERENTIAL/PLATELET
Basophils Absolute: 0.1 10*3/uL (ref 0.0–0.1)
Basophils Relative: 1 %
Eosinophils Absolute: 0.1 10*3/uL (ref 0.0–0.5)
Eosinophils Relative: 3 %
HCT: 34.8 % (ref 34.8–46.6)
Hemoglobin: 11.2 g/dL — ABNORMAL LOW (ref 11.6–15.9)
Lymphocytes Relative: 16 %
Lymphs Abs: 0.6 10*3/uL — ABNORMAL LOW (ref 0.9–3.3)
MCH: 29.6 pg (ref 25.1–34.0)
MCHC: 32.3 g/dL (ref 31.5–36.0)
MCV: 91.7 fL (ref 79.5–101.0)
Monocytes Absolute: 0.6 10*3/uL (ref 0.1–0.9)
Monocytes Relative: 13 %
Neutro Abs: 2.8 10*3/uL (ref 1.5–6.5)
Neutrophils Relative %: 67 %
Platelets: 219 10*3/uL (ref 145–400)
RBC: 3.79 MIL/uL (ref 3.70–5.45)
RDW: 15.2 % — ABNORMAL HIGH (ref 11.2–14.5)
WBC: 4.2 10*3/uL (ref 3.9–10.3)

## 2017-11-27 LAB — COMPREHENSIVE METABOLIC PANEL
ALT: 23 U/L (ref 0–55)
AST: 25 U/L (ref 5–34)
Albumin: 3.5 g/dL (ref 3.5–5.0)
Alkaline Phosphatase: 80 U/L (ref 40–150)
Anion gap: 8 (ref 3–11)
BILIRUBIN TOTAL: 0.5 mg/dL (ref 0.2–1.2)
BUN: 22 mg/dL (ref 7–26)
CHLORIDE: 106 mmol/L (ref 98–109)
CO2: 26 mmol/L (ref 22–29)
Calcium: 9.5 mg/dL (ref 8.4–10.4)
Creatinine, Ser: 0.98 mg/dL (ref 0.60–1.10)
GFR calc Af Amer: >60 mL/min (ref >60)
GFR, EST NON AFRICAN AMERICAN: 56 mL/min — AB (ref >60)
Glucose, Bld: 105 mg/dL (ref 70–140)
Potassium: 4.6 mmol/L (ref 3.5–5.1)
Sodium: 140 mmol/L (ref 136–145)
Total Protein: 6.5 g/dL (ref 6.4–8.3)

## 2017-11-27 LAB — LACTATE DEHYDROGENASE: LDH: 176 U/L (ref 125–245)

## 2017-11-27 NOTE — Assessment & Plan Note (Signed)
02/03/2017: Large superior mediastinal mass contiguous with right hilar region encasing the trachea and right mainstem bronchus measuring 5.4 x 7.5 x 6 cm causing SVC obstruction, additional mediastinal lymph nodes subcarinal 1.6 cm and left hilar 1 cm; multiple small pulmonary nodules 4-6 mm   Bronchoscopy and biopsy 02/09/2017: Monoclonal B-cell population expressing CD10 consistent with non-Hodgkin's B-cell lymphoma germinal center origin Bone marrow biopsy 02/21/2017: No evidence of lymphoma PET/CT scan 81/59/4707: Hypermetabolic right paratracheal nodal mass  R CHOP x6 cycles completed 07/16/2017 ---------------------------------------------------------------------- Atrial flutter: Cardioversion by Dr. Acie Fredrickson 07/18/2017 PET/CT scan 08/27/2017:Hypermetabolic right paratracheal nodal mass February 2019: Renal stone status post nephrostomy  No clinical evidence of lymphoma. Anemia due to recent blood loss. Return to clinic in 3 months with CT chest labs and follow-up.

## 2017-11-27 NOTE — Telephone Encounter (Signed)
Left message for patient regarding upcoming may appointment updates. Mailed patient calendar of updated appointments.

## 2017-11-27 NOTE — Progress Notes (Signed)
Patient Care Team: [name], MD as PCP - General (Endocrinology) Deboraha Sprang, MD as PCP - Cardiology (Cardiology)  DIAGNOSIS:  Encounter Diagnosis  Name Primary?  . 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) Yes    SUMMARY OF ONCOLOGIC HISTORY:   Non-Hodgkin lymphoma, unspecified, intrathoracic lymph nodes ([place])   02/03/2017 Imaging    Large superior mediastinal mass contiguous with right hilar region encasing the trachea and right mainstem bronchus measuring 5.4 x 7.5 x 6 cm causing SVC obstruction, additional mediastinal lymph nodes subcarinal 1.6 cm and left hilar 1 cm; multiple small pulmonary nodules 4-6 mm       02/11/2017 Initial Diagnosis    Flow cytometry of mediastinal mass revealed monoclonal population of B cells with expression of CD10 consistent with non-Hodgkin B-cell lymphoma germinal center operation; bone marrow biopsy negative for lymphoma      04/02/2017 - 07/16/2017 Chemotherapy    R-CHOP X 6 cycles        CHIEF COMPLIANT: Surveillance of lymphoma  INTERVAL HISTORY: Kayla Harrison is a 72 year old with above-mentioned history of large mediastinal mass which was non-Hodgkin's lymphoma  which was treated with R CHOP x6 cycles and had a complete response.  She is here today for a follow-up.  Recently she has had problems with kidney stones for which she even had to undergo nephrostomy.  She is finally relieved off the symptoms of kidney stones.  Her energy levels have improved significantly.  Her shortness of breath is also much better.  she still is slow to get around.  REVIEW OF SYSTEMS:   Constitutional: Denies fevers, chills or abnormal weight loss Eyes: Denies blurriness of vision Ears, nose, mouth, throat, and face: Denies mucositis or sore throat Respiratory: Denies cough, dyspnea or wheezes Cardiovascular: Denies palpitation, chest discomfort Gastrointestinal:  Denies nausea, heartburn or change in  bowel habits Skin: Denies abnormal skin rashes Lymphatics: Denies new lymphadenopathy or easy bruising Neurological:Denies numbness, tingling or new weaknesses Behavioral/Psych: Mood is stable, no new changes  Extremities: No lower extremity edema  All other systems were reviewed with the patient and are negative.  I have reviewed the past medical history, past surgical history, social history and family history with the patient and they are unchanged from previous note.  ALLERGIES:  is allergic to tikosyn [dofetilide]; xarelto [rivaroxaban]; epinephrine; ketamine; oral anesthetic [benzocaine]; and adhesive [tape].  MEDICATIONS:  Current Outpatient Medications  Medication Sig Dispense Refill  . amiodarone (PACERONE) 200 MG tablet Take 1 tablet (200 mg total) by mouth daily. 90 tablet 2  . cycloSPORINE (RESTASIS) 0.05 % ophthalmic emulsion Place 1 drop into both eyes 2 (two) times daily.    . diphenhydramine-acetaminophen (TYLENOL PM) 25-500 MG TABS tablet Take 2 tablets by mouth at bedtime.     Marland Kitchen ELIQUIS 5 MG TABS tablet TAKE 1 TABLET BY MOUTH TWO  TIMES DAILY (Patient not taking: Reported on 11/12/2017) 180 tablet 1  . furosemide (LASIX) 20 MG tablet Take 1 tablets daily till you see your cardiolgist (Patient taking differently: Take 20 mg by mouth daily as needed for fluid or edema. ) 30 tablet 0  . levothyroxine (SYNTHROID, LEVOTHROID) 175 MCG tablet Take 175 mcg by mouth See admin instructions. TAKE 1 TABLET (175 MCG) BY MOUTH DAILY, EXCEPT FRIDAYS.    Marland Kitchen Lifitegrast (XIIDRA) 5 % SOLN Apply 1 drop to eye 2 (two) times daily.    . Multiple Vitamin (MULTIVITAMIN) tablet Take 1 tablet by mouth daily.     Marland Kitchen  oxyCODONE-acetaminophen (PERCOCET/ROXICET) 5-325 MG tablet Take 1 tablet by mouth daily as needed for severe pain.     No current facility-administered medications for this visit.     PHYSICAL EXAMINATION: ECOG PERFORMANCE STATUS: 1 - Symptomatic but completely ambulatory  Vitals:    11/27/17 1406  BP: 118/69  Pulse: 77  Resp: 17  Temp: (!) 97.5 F (36.4 C)  SpO2: 99%   Filed Weights   11/27/17 1406  Weight: 200 lb 12.8 oz (91.1 kg)    GENERAL:alert, no distress and comfortable SKIN: skin color, texture, turgor are normal, no rashes or significant lesions EYES: normal, Conjunctiva are pink and non-injected, sclera clear OROPHARYNX:no exudate, no erythema and lips, buccal mucosa, and tongue normal  NECK: supple, thyroid normal size, non-tender, without nodularity LYMPH:  no palpable lymphadenopathy in the cervical, axillary or inguinal LUNGS: clear to auscultation and percussion with normal breathing effort HEART: regular rate & rhythm and no murmurs and no lower extremity edema ABDOMEN:abdomen soft, non-tender and normal bowel sounds MUSCULOSKELETAL:no cyanosis of digits and no clubbing  NEURO: alert & oriented x 3 with fluent speech, no focal motor/sensory deficits EXTREMITIES: No lower extremity edema  LABORATORY DATA:  I have reviewed the data as listed CMP Latest Ref Rng & Units 11/17/2017 11/12/2017 09/26/2017  Glucose 65 - 99 mg/dL 109(H) 103(H) 94  BUN 6 - 20 mg/dL 27(H) 21(H) 16  Creatinine 0.44 - 1.00 mg/dL 1.05(H) 1.03(H) 0.91  Sodium 135 - 145 mmol/L 143 142 138  Potassium 3.5 - 5.1 mmol/L 4.0 4.4 4.1  Chloride 101 - 111 mmol/L 110 108 109  CO2 22 - 32 mmol/L _0 Calcium 8.9 - 10.3 mg/dL 9.4 9.3 8.9  Total Protein 6.5 - 8.1 g/dL - - -  Total Bilirubin 0.3 - 1.2 mg/dL - - -  Alkaline Phos 38 - 126 U/L - - -  AST 15 - 41 U/L - - -  ALT 14 - 54 U/L - - -    Lab Results  Component Value Date   WBC 4.2 11/27/2017   HGB 11.2 (L) 11/27/2017   HCT 34.8 11/27/2017   MCV 91.7 11/27/2017   PLT 219 11/27/2017   NEUTROABS 2.8 11/27/2017    ASSESSMENT & PLAN:  Non-Hodgkin lymphoma, unspecified, intrathoracic lymph nodes ([place]) 02/03/2017: Large superior mediastinal mass contiguous with right hilar region encasing the trachea and right  mainstem bronchus measuring 5.4 x 7.5 x 6 cm causing SVC obstruction, additional mediastinal lymph nodes subcarinal 1.6 cm and left hilar 1 cm; multiple small pulmonary nodules 4-6 mm   Bronchoscopy and biopsy 02/09/2017: Monoclonal B-cell population expressing CD10 consistent with non-Hodgkin's B-cell lymphoma germinal center origin Bone marrow biopsy 02/21/2017: No evidence of lymphoma PET/CT scan 19/62/2297: Hypermetabolic right paratracheal nodal mass  R CHOP x6 cycles completed 07/16/2017 ---------------------------------------------------------------------- Atrial flutter: Cardioversion by Dr. Acie Fredrickson 07/18/2017 PET/CT scan 08/27/2017:Hypermetabolic right paratracheal nodal mass February 2019: Renal stone status post nephrostomy  No clinical evidence of lymphoma. Anemia due to recent blood loss. Return to clinic in 3 months with CT chest labs and follow-up.   I spent 25 minutes talking to the patient of which more than half was spent in counseling and coordination of care.  Orders Placed This Encounter  Procedures  . CT Chest W Contrast    Standing Status:   Future    Standing Expiration Date:   11/27/2018    Order Specific Question:   If indicated for the ordered procedure, I authorize the administration of  contrast media per Radiology protocol    Answer:   Yes    Order Specific Question:   Preferred imaging location?    Answer:   [facility]    Order Specific Question:   Radiology Contrast Protocol - do NOT remove file path    Answer:   _0 [email or web address]    Order Specific Question:   Reason for Exam additional comments    Answer:   lymphoma retaging  . CBC with Differential (Cancer Center Only)    Standing Status:   Future    Standing Expiration Date:   11/28/2018  . CMP ([place] only)    Standing Status:   Future    Standing Expiration Date:   11/28/2018  . Lactate dehydrogenase (LDH)    Standing Status:   Future    Standing  Expiration Date:   11/27/2018   The patient has a good understanding of the overall plan. she agrees with it. she will call with any problems that may develop before the next visit here.   Harriette Ohara, MD 11/27/17

## 2017-11-28 DIAGNOSIS — M19011 Primary osteoarthritis, right shoulder: Secondary | ICD-10-CM | POA: Diagnosis not present

## 2017-11-28 DIAGNOSIS — Z79891 Long term (current) use of opiate analgesic: Secondary | ICD-10-CM | POA: Diagnosis not present

## 2017-11-28 DIAGNOSIS — Z7901 Long term (current) use of anticoagulants: Secondary | ICD-10-CM | POA: Diagnosis not present

## 2017-11-28 DIAGNOSIS — I4891 Unspecified atrial fibrillation: Secondary | ICD-10-CM | POA: Diagnosis not present

## 2017-11-28 DIAGNOSIS — C8592 Non-Hodgkin lymphoma, unspecified, intrathoracic lymph nodes: Secondary | ICD-10-CM | POA: Diagnosis not present

## 2017-12-03 DIAGNOSIS — M19011 Primary osteoarthritis, right shoulder: Secondary | ICD-10-CM | POA: Diagnosis not present

## 2017-12-03 DIAGNOSIS — Z79891 Long term (current) use of opiate analgesic: Secondary | ICD-10-CM | POA: Diagnosis not present

## 2017-12-03 DIAGNOSIS — Z7901 Long term (current) use of anticoagulants: Secondary | ICD-10-CM | POA: Diagnosis not present

## 2017-12-03 DIAGNOSIS — I4891 Unspecified atrial fibrillation: Secondary | ICD-10-CM | POA: Diagnosis not present

## 2017-12-03 DIAGNOSIS — C8592 Non-Hodgkin lymphoma, unspecified, intrathoracic lymph nodes: Secondary | ICD-10-CM | POA: Diagnosis not present

## 2017-12-12 DIAGNOSIS — Z7901 Long term (current) use of anticoagulants: Secondary | ICD-10-CM | POA: Diagnosis not present

## 2017-12-12 DIAGNOSIS — M19011 Primary osteoarthritis, right shoulder: Secondary | ICD-10-CM | POA: Diagnosis not present

## 2017-12-12 DIAGNOSIS — I4891 Unspecified atrial fibrillation: Secondary | ICD-10-CM | POA: Diagnosis not present

## 2017-12-12 DIAGNOSIS — C8592 Non-Hodgkin lymphoma, unspecified, intrathoracic lymph nodes: Secondary | ICD-10-CM | POA: Diagnosis not present

## 2017-12-12 DIAGNOSIS — Z79891 Long term (current) use of opiate analgesic: Secondary | ICD-10-CM | POA: Diagnosis not present

## 2017-12-15 ENCOUNTER — Encounter: Payer: Self-pay | Admitting: Internal Medicine

## 2017-12-15 ENCOUNTER — Ambulatory Visit (INDEPENDENT_AMBULATORY_CARE_PROVIDER_SITE_OTHER): Payer: Medicare Other | Admitting: Internal Medicine

## 2017-12-15 VITALS — BP 106/60 | HR 85 | Ht 66.0 in | Wt 200.0 lb

## 2017-12-15 DIAGNOSIS — I4819 Other persistent atrial fibrillation: Secondary | ICD-10-CM

## 2017-12-15 DIAGNOSIS — I442 Atrioventricular block, complete: Secondary | ICD-10-CM | POA: Diagnosis not present

## 2017-12-15 DIAGNOSIS — Z950 Presence of cardiac pacemaker: Secondary | ICD-10-CM

## 2017-12-15 DIAGNOSIS — I495 Sick sinus syndrome: Secondary | ICD-10-CM | POA: Diagnosis not present

## 2017-12-15 DIAGNOSIS — I481 Persistent atrial fibrillation: Secondary | ICD-10-CM | POA: Diagnosis not present

## 2017-12-15 DIAGNOSIS — Z79899 Other long term (current) drug therapy: Secondary | ICD-10-CM | POA: Diagnosis not present

## 2017-12-15 LAB — CUP PACEART INCLINIC DEVICE CHECK
Battery Voltage: 3.01 V
Brady Statistic AP VP Percent: 4.88 %
Brady Statistic AP VS Percent: 0.12 %
Brady Statistic AS VS Percent: 94.7 %
Brady Statistic RV Percent Paced: 4.99 %
Implantable Lead Implant Date: 20140618
Implantable Lead Implant Date: 20140618
Implantable Lead Location: 753859
Implantable Pulse Generator Implant Date: 20140618
Lead Channel Impedance Value: 361 Ohm
Lead Channel Impedance Value: 475 Ohm
Lead Channel Impedance Value: 912 Ohm
Lead Channel Impedance Value: 969 Ohm
Lead Channel Pacing Threshold Amplitude: 0.75 V
Lead Channel Pacing Threshold Amplitude: 0.75 V
Lead Channel Pacing Threshold Pulse Width: 0.4 ms
Lead Channel Sensing Intrinsic Amplitude: 0.75 mV
Lead Channel Sensing Intrinsic Amplitude: 8.625 mV
Lead Channel Setting Pacing Amplitude: 2 V
Lead Channel Setting Pacing Amplitude: 2.5 V
MDC IDC LEAD LOCATION: 753860
MDC IDC MSMT BATTERY REMAINING LONGEVITY: 79 mo
MDC IDC MSMT LEADCHNL RA PACING THRESHOLD PULSEWIDTH: 0.4 ms
MDC IDC MSMT LEADCHNL RA SENSING INTR AMPL: 0.625 mV
MDC IDC MSMT LEADCHNL RV SENSING INTR AMPL: 10.875 mV
MDC IDC SESS DTM: 20190325092841
MDC IDC SET LEADCHNL RV PACING PULSEWIDTH: 0.4 ms
MDC IDC SET LEADCHNL RV SENSING SENSITIVITY: 0.9 mV
MDC IDC STAT BRADY AS VP PERCENT: 0.3 %
MDC IDC STAT BRADY RA PERCENT PACED: 4.95 %

## 2017-12-15 MED ORDER — AMIODARONE HCL 200 MG PO TABS
200.0000 mg | ORAL_TABLET | Freq: Every day | ORAL | 3 refills | Status: DC
Start: 2017-12-15 — End: 2018-01-02

## 2017-12-15 NOTE — Progress Notes (Signed)
Patient Care Team: [name], MD as PCP - General (Endocrinology) Deboraha Sprang, MD as PCP - Cardiology (Cardiology)   HPI  Kayla Harrison is a 72 y.o. female Seen in followup for a pacemaker implanted at [facility] and atrial fibrillation.  She underwent pulmonary vein isolation at [facility] and had recurrent atrial fibrillation and underwent convergent ablation at [facility] 1/93; it was complicated by heart block prompting pacemaker implantation.   5/18 presented w SOB and facial fullness, found to have SVC syndrome 2/2 NHlymphoma Rx initially with XRT  Now in remission  She has had recurrent aFib and  was restarted on amio--and then stopped 2/2 worsening SOB  She has a history of prior strokes and remains on oral anticoagulation . She is on apixoban and tolerating well.    Hospitalized 12/18 for wide-complex tachycardia which was thought to be related to intermittent pacing because of atrial flutter with blanking Reprogrammed DDIR.  Amiodarone resumed   Had B renal calculi Rx 2/19 w nephrostomy and stenting.  She was glad to get both sides done at the same time.  She is back to exercising.  She has had scant palpitations.  She is tolerating the amiodarone quite well.  There is no cough or nausea.  Shortness of breath is at baseline.  No edema.  She has however been having episodes of lightheadedness and shortness of breath that quickly come and go.  See below   Date TSH ALT Hgb  8/18 0.2 15 11.9  9/18                     9.3  3/19 0.722 (12/18) 23 11.2          DATE TEST    2011 Echo    EF 55 % LAE 40 mm  7/14 Echo    EF 55-60 %   9/18 Echo  EF 55-60%   12/18 Echo  EF 55-60% Severe LAE ?      She also has a history of morbid obesity status post lap band surgery    Thromboembolic risk factors ( age -2 , TIA/CVA-2, Gender-1) for a CHADSVASc Score of 4   Past Medical History:  Diagnosis Date  . Anemia   . Arthritis    osteoarthritis - knees , history of  .  Atrial fibrillation [facility])    ablation- 2x's-- 1st time- [facility], 2nd event at [facility] in 2008. Convergent ablation at [facility] 6/14  . Atrial fibrillation ([place])   . Blood transfusion without reported diagnosis   . Breast cancer ([place])    Dr Margot Chimes, total thyroidectomy- 1999- for cancer  . Chronic bilateral pleural effusions   . Colon polyp    Dr Earlean Shawl  . Complete heart block ([place])   . Complication of anesthesia    Ketamine produces LSD reaction, bright colored nightmarish experience   . Dyslipidemia   . Dyspnea   . H/O pleural effusion    s/p thoracentesis w 3229m withdrawn  . Hepatitis    [place]hepatitis   . History of dysphagia    due to radiation therapy  . History of hiatal hernia    small noted on PET scan  . History of kidney stones   . Hx of thyroid cancer    Dr SForde Dandy . Hyperlipidemia   . Hypothyroidism   . [place]   radiation RX completed 03/04/17;  will start chemo 6/27, pt unaware of lung cancer  . Morbid obesity ([place])    Status post lap band surgery  . Non Hodgkin's lymphoma ([place])    on chemotherapy  . Persistent atrial fibrillation ([place])    a. s/p PVI 2008 b. s/p convergent ablation 8416 complicated by bradycardia requiring pacemaker implant  . Presence of permanent cardiac pacemaker   . Sinus node dysfunction ([place])    Complicating convergent ablation 6/14  . Stroke [facility])    2003- [place] x2  . SVC syndrome     Past Surgical History:  Procedure Laterality Date  . ABDOMINAL HYSTERECTOMY  1983  . afib ablation     a. 2008 PVI b. 2014 convergent ablation  . APPENDECTOMY    . BONE MARROW BIOPSY  02/21/2017  . BREAST LUMPECTOMY  2010  . bso  1998  . CARDIOVERSION  10/09/2012   Procedure: CARDIOVERSION;  Surgeon: Minus Breeding, MD;  Location: [place];  Service: Cardiovascular;  Laterality: N/A;  . CARDIOVERSION  10/09/2012   Procedure: CARDIOVERSION;  Surgeon: Minus Breeding, MD;  Location: [facility]  ENDOSCOPY;  Service: Cardiovascular;  Laterality: N/A;  [name] gave the ok to add pt to the add on , but we must check to find out if the can add pt on at [phone] ( [phone])  . CARDIOVERSION N/A 11/20/2012   Procedure: CARDIOVERSION;  Surgeon: Fay Records, MD;  Location: [facility] ENDOSCOPY;  Service: Cardiovascular;  Laterality: N/A;  . CARDIOVERSION N/A 07/18/2017   Procedure: CARDIOVERSION;  Surgeon: Thayer Headings, MD;  Location: [facility] ENDOSCOPY;  Service: Cardiovascular;  Laterality: N/A;  . CARDIOVERSION N/A 10/03/2017   Procedure: CARDIOVERSION;  Surgeon: Sanda Klein, MD;  Location: [facility] ENDOSCOPY;  Service: Cardiovascular;  Laterality: N/A;  . CHOLECYSTECTOMY    . COLONOSCOPY W/ POLYPECTOMY     Dr Earlean Shawl  . CYSTOSCOPY N/A 02/06/2015   Procedure: CYSTOSCOPY;  Surgeon: Kathie Rhodes, MD;  Location: [facility] ORS;  Service: Urology;  Laterality: N/A;  . CYSTOSCOPY W/ RETROGRADES Left 11/17/2017   Procedure: CYSTOSCOPY WITH RETROGRADE /PYELOGRAM/;  Surgeon: Kathie Rhodes, MD;  Location: [facility] ORS;  Service: Urology;  Laterality: Left;  . CYSTOSCOPY WITH RETROGRADE PYELOGRAM, URETEROSCOPY AND STENT PLACEMENT Right 02/06/2015   Procedure: RETROGRADE PYELOGRAM, RIGHT URETEROSCOPY STENT PLACEMENT;  Surgeon: Kathie Rhodes, MD;  Location: [facility] ORS;  Service: Urology;  Laterality: Right;  . CYSTOSCOPY WITH RETROGRADE PYELOGRAM, URETEROSCOPY AND STENT PLACEMENT Right 03/07/2017   Procedure: CYSTOSCOPY WITH RIGHT RETROGRADE PYELOGRAM,RIGHT  URETEROSCOPYLASER LITHOTRIPSY  AND STENT PLACEMENT AND STONE BASKETRY;  Surgeon: Kathie Rhodes, MD;  Location: [place];  Service: Urology;  Laterality: Right;  . EYE SURGERY     cataract surgery  . HOLMIUM LASER APPLICATION N/A 02/27/3015   Procedure: HOLMIUM LASER APPLICATION;  Surgeon: Kathie Rhodes, MD;  Location: [facility]S;  Service: Urology;  Laterality: N/A;  . HOLMIUM LASER APPLICATION Right 0/06/9322   Procedure: HOLMIUM LASER APPLICATION;  Surgeon: Kathie Rhodes, MD;   Location: [facility];  Service: Urology;  Laterality: Right;  . HOLMIUM LASER APPLICATION Left 5/57/3220   Procedure: HOLMIUM LASER APPLICATION;  Surgeon: Kathie Rhodes, MD;  Location: [facility] ORS;  Service: Urology;  Laterality: Left;  . IR FLUORO GUIDE PORT INSERTION RIGHT  02/24/2017  . IR NEPHROSTOMY PLACEMENT RIGHT  11/17/2017  . IR PATIENT EVAL TECH 0-60 MINS  03/11/2017  . IR US GUIDE VASC ACCESS RIGHT  02/24/2017  . KNEE ARTHROSCOPY     bilateral  . LAPAROSCOPIC GASTRIC  BANDING  07/10/2010  . LAPAROTOMY     for ruptured ovary and ovarian artery   . NEPHROLITHOTOMY Right 11/17/2017   Procedure: NEPHROLITHOTOMY PERCUTANEOUS;  Surgeon: Kathie Rhodes, MD;  Location: [facility];  Service: Urology;  Laterality: Right;  . PACEMAKER INSERTION  03/10/2013   MDT dual chamber PPM  . POCKET REVISION N/A 12/08/2013   Procedure: POCKET REVISION;  Surgeon: Deboraha Sprang, MD;  Location: [facility] CATH LAB;  Service: Cardiovascular;  Laterality: N/A;  . PORTA CATH INSERTION    . TEE WITH CARDIOVERSION  09/22/2017  . TEE WITHOUT CARDIOVERSION N/A 10/03/2017   Procedure: TRANSESOPHAGEAL ECHOCARDIOGRAM (TEE);  Surgeon: Sanda Klein, MD;  Location: [place];  Service: Cardiovascular;  Laterality: N/A;  . THYROIDECTOMY  1998   Dr Margot Chimes  . TONSILLECTOMY    . TOTAL KNEE ARTHROPLASTY  04/13/2012   Procedure: TOTAL KNEE ARTHROPLASTY;  Surgeon: Rudean Haskell, MD;  Location: [place];  Service: Orthopedics;  Laterality: Right;  Marland Kitchen VIDEO BRONCHOSCOPY WITH ENDOBRONCHIAL ULTRASOUND N/A 02/07/2017   Procedure: VIDEO BRONCHOSCOPY WITH ENDOBRONCHIAL ULTRASOUND;  Surgeon: Marshell Garfinkel, MD;  Location: [place];  Service: Pulmonary;  Laterality: N/A;    Current Outpatient Medications  Medication Sig Dispense Refill  . amiodarone (PACERONE) 200 MG tablet Take 1 tablet (200 mg total) by mouth daily. 90 tablet 3  . cycloSPORINE (RESTASIS) 0.05 % ophthalmic emulsion Place 1 drop into both eyes 2 (two) times daily.      . diphenhydramine-acetaminophen (TYLENOL PM) 25-500 MG TABS tablet Take 2 tablets by mouth at bedtime.     Marland Kitchen ELIQUIS 5 MG TABS tablet TAKE 1 TABLET BY MOUTH TWO  TIMES DAILY 180 tablet 1  . furosemide (LASIX) 20 MG tablet Take 1 tablets daily till you see your cardiolgist (Patient taking differently: Take 20 mg by mouth daily as needed for fluid or edema. ) 30 tablet 0  . levothyroxine (SYNTHROID, LEVOTHROID) 175 MCG tablet Take 175 mcg by mouth See admin instructions. TAKE 1 TABLET (175 MCG) BY MOUTH DAILY, EXCEPT FRIDAYS.    Marland Kitchen Lifitegrast (XIIDRA) 5 % SOLN Apply 1 drop to eye 2 (two) times daily.    . Multiple Vitamin (MULTIVITAMIN) tablet Take 1 tablet by mouth daily.     Marland Kitchen oxyCODONE-acetaminophen (PERCOCET/ROXICET) 5-325 MG tablet Take 1 tablet by mouth daily as needed for severe pain.     No current facility-administered medications for this visit.     Allergies  Allergen Reactions  . Tikosyn [Dofetilide] Other (See Comments)    Prolonged QT interval Prolonged QT interval  . Xarelto [Rivaroxaban] Other (See Comments)    Nose Bleed X 6 hrs ; packing in ER  . Epinephrine Other (See Comments) and Rash    Dental form only (liquid). Patient stated she will become "out of it" she can hear you but cannot respond in normal fashion.  Marland Kitchen Ketamine     Hallucinations  . Oral Anesthetic [Benzocaine]   . Adhesive [Tape] Rash    Paper tape as well    Review of Systems negative except from HPI and PMH  Physical Exam BP 106/60   Pulse 85   Ht '5\' 6"'$  (1.676 m)   Wt 200 lb (90.7 kg)   SpO2 95%   BMI 32.28 kg/m  Well developed and nourished in no acute distress HENT normal Neck supple with JVP-flat Clear Regular rate and rhythm, no murmurs or gallops Abd-soft with active BS No Clubbing cyanosis edema Skin-warm and dry A & Oriented  Grossly  normal sensory and motor function    ECG sinus at 80  19/09/42  Device interrogation demonstrated her symptoms correlated with junctional  rhythm  Assessment and  Plan  Atrial fibrillation/flutter  S/p convergent ablation persistent  Prior Strokes  Pacemaker Medtronic MRI compatible   Non-Hodgkin's lymphoma on chemotherapy  Dyspnea on exertion     Treated hypothyroidism  High Risk Medication Surveillance  Junctional rhythm   She is tolerating amiodarone with few atrial arrhythmias currently.  She is tolerating her apixaban without bleeding.  Exercise tolerance is improving   her episodes of lightheadedness however correlated clearly with onset of junctional rhythm.  We reprogrammed her device with atrial preference pacing to try to overdrive this junctional automaticity.  On initial observation it seemed to have a large benefit in maintaining AV synchrony   We spent more than 50% of our >25 min visit in face to face counseling regarding the above

## 2017-12-15 NOTE — Patient Instructions (Addendum)
Medication Instructions:  Your physician recommends that you continue on your current medications as directed. Please refer to the Current Medication list given to you today.  Labwork: None ordered.  Testing/Procedures: None ordered.  Follow-Up: Your physician recommends that you schedule a follow-up appointment in:  6 months with Dr Caryl Comes  Remote monitoring is used to monitor your Pacemaker from home. This monitoring reduces the number of office visits required to check your device to one time per year. It allows [place] to keep an eye on the functioning of your device to ensure it is working properly. You are scheduled for a device check from home on 12/22/2017. You may send your transmission at any time that day. If you have a wireless device, the transmission will be sent automatically. After your physician reviews your transmission, you will receive a postcard with your next transmission date.    Any Other Special Instructions Will Be Listed Below (If Applicable).     If you need a refill on your cardiac medications before your next appointment, please call your pharmacy.

## 2017-12-17 DIAGNOSIS — M19011 Primary osteoarthritis, right shoulder: Secondary | ICD-10-CM | POA: Diagnosis not present

## 2017-12-18 ENCOUNTER — Ambulatory Visit: Payer: Medicare Other | Admitting: Internal Medicine

## 2017-12-23 ENCOUNTER — Encounter: Payer: Self-pay | Admitting: Internal Medicine

## 2017-12-23 ENCOUNTER — Telehealth: Payer: Self-pay

## 2017-12-23 NOTE — Telephone Encounter (Signed)
   [facility] Pre-operative Risk Assessment    Request for surgical clearance:  1. What type of surgery is being performed? R reverse total shoulder arthroplasty   2. When is this surgery scheduled? tbd   3. What type of clearance is required (medical clearance vs. Pharmacy clearance to hold med vs. Both)? medical  4. Are there any medications that need to be held prior to surgery and how long?n/a   5. Practice name and name of physician performing surgery? [facility]/justin chandler   6. What is your office phone and fax number? [phone]  [phone]   7. Anesthesia type (None, local, MAC, general) ? local   Kayla Harrison 12/23/2017, 12:59 PM  _________________________________________________________________   (provider comments below)

## 2017-12-26 NOTE — Telephone Encounter (Signed)
   Patient was seen by Dr. Caryl Comes on 12/15/2017.  Cardiac clearance for shoulder arthroplasty was not addressed.  Dr. Caryl Comes is on vacation right now.  I will route this to Dr. Caryl Comes so that he may say if any further evaluation needs to be performed or if she is at acceptable risk for the surgery.  Rosaria Ferries, PA-C 12/26/2017 2:55 PM Beeper [phone]

## 2018-01-01 ENCOUNTER — Encounter: Payer: Self-pay | Admitting: Internal Medicine

## 2018-01-01 NOTE — Telephone Encounter (Signed)
She is cleared for arthoplasty  The issue will be anticoagulation

## 2018-01-01 NOTE — Telephone Encounter (Signed)
Spoke with patient regarding her c/o Afib/Flutter. She states she has been symptomatic and "felt like I was going to pass out at [employer] yesterday and today while I was at my house." She states she has had HR's in the 110's and decreasing BP's for the last several days.   An appt has been scheduled for her to visit the Afib clinic tomorrow (4/12) to address this situation. She has not missed a day of anticoagulation. I advised her to do activity as tolerated and not to over exert herself since she is symptomatic. She will have someone drive her to her appt as well.   She verbalized understanding and had no additional questions.

## 2018-01-01 NOTE — Telephone Encounter (Signed)
Patient with diagnosis of afib on Eliquis for anticoagulation.    Procedure: shoulder arthroplasty Date of procedure: TBD  CHADS2-VASc score of  6 (CHF, HTN, AGE, DM2, stroke/tia x 2, CAD, AGE, female)  CrCl 10ml/min adjusted for weight 9ml/min  Per office protocol, patient can hold Eliquis for 24 hours prior to procedure.  Given history of stroke would recommend resume Eliquis as soon as safe post -procedure.

## 2018-01-02 ENCOUNTER — Ambulatory Visit (HOSPITAL_COMMUNITY)
Admission: RE | Admit: 2018-01-02 | Discharge: 2018-01-02 | Disposition: A | Discharge status: Home / Self Care | Payer: Medicare Other | Source: Ambulatory Visit | Attending: Nurse Practitioner | Admitting: Nurse Practitioner

## 2018-01-02 ENCOUNTER — Encounter (HOSPITAL_COMMUNITY): Payer: Self-pay | Admitting: Nurse Practitioner

## 2018-01-02 VITALS — BP 108/82 | HR 123 | Ht 66.0 in | Wt 196.8 lb

## 2018-01-02 DIAGNOSIS — K449 Diaphragmatic hernia without obstruction or gangrene: Secondary | ICD-10-CM | POA: Diagnosis not present

## 2018-01-02 DIAGNOSIS — Z7901 Long term (current) use of anticoagulants: Secondary | ICD-10-CM | POA: Insufficient documentation

## 2018-01-02 DIAGNOSIS — Z853 Personal history of malignant neoplasm of breast: Secondary | ICD-10-CM | POA: Diagnosis not present

## 2018-01-02 DIAGNOSIS — Z950 Presence of cardiac pacemaker: Secondary | ICD-10-CM | POA: Insufficient documentation

## 2018-01-02 DIAGNOSIS — I4892 Unspecified atrial flutter: Secondary | ICD-10-CM | POA: Insufficient documentation

## 2018-01-02 DIAGNOSIS — E785 Hyperlipidemia, unspecified: Secondary | ICD-10-CM | POA: Insufficient documentation

## 2018-01-02 DIAGNOSIS — Z9884 Bariatric surgery status: Secondary | ICD-10-CM | POA: Insufficient documentation

## 2018-01-02 DIAGNOSIS — E039 Hypothyroidism, unspecified: Secondary | ICD-10-CM | POA: Insufficient documentation

## 2018-01-02 DIAGNOSIS — Z884 Allergy status to anesthetic agent status: Secondary | ICD-10-CM | POA: Insufficient documentation

## 2018-01-02 DIAGNOSIS — Z85118 Personal history of other malignant neoplasm of bronchus and lung: Secondary | ICD-10-CM | POA: Insufficient documentation

## 2018-01-02 DIAGNOSIS — Z888 Allergy status to other drugs, medicaments and biological substances status: Secondary | ICD-10-CM | POA: Diagnosis not present

## 2018-01-02 DIAGNOSIS — Z85850 Personal history of malignant neoplasm of thyroid: Secondary | ICD-10-CM | POA: Insufficient documentation

## 2018-01-02 DIAGNOSIS — I442 Atrioventricular block, complete: Secondary | ICD-10-CM | POA: Insufficient documentation

## 2018-01-02 DIAGNOSIS — I481 Persistent atrial fibrillation: Secondary | ICD-10-CM | POA: Diagnosis not present

## 2018-01-02 DIAGNOSIS — Z79899 Other long term (current) drug therapy: Secondary | ICD-10-CM | POA: Diagnosis not present

## 2018-01-02 DIAGNOSIS — Z6831 Body mass index (BMI) 31.0-31.9, adult: Secondary | ICD-10-CM | POA: Insufficient documentation

## 2018-01-02 DIAGNOSIS — Z8673 Personal history of transient ischemic attack (TIA), and cerebral infarction without residual deficits: Secondary | ICD-10-CM | POA: Diagnosis not present

## 2018-01-02 LAB — BASIC METABOLIC PANEL
Anion gap: 9 (ref 5–15)
BUN: 17 mg/dL (ref 6–20)
CHLORIDE: 105 mmol/L (ref 101–111)
CO2: 24 mmol/L (ref 22–32)
CREATININE: 1.07 mg/dL — AB (ref 0.44–1.00)
Calcium: 9.2 mg/dL (ref 8.9–10.3)
GFR calc Af Amer: 58 mL/min — ABNORMAL LOW (ref >60)
GFR calc non Af Amer: 50 mL/min — ABNORMAL LOW (ref >60)
Glucose, Bld: 116 mg/dL — ABNORMAL HIGH (ref 65–99)
Potassium: 4 mmol/L (ref 3.5–5.1)
SODIUM: 138 mmol/L (ref 135–145)

## 2018-01-02 LAB — CBC
HCT: 39.3 % (ref 36.0–46.0)
HEMOGLOBIN: 12.6 g/dL (ref 12.0–15.0)
MCH: 29.4 pg (ref 26.0–34.0)
MCHC: 32.1 g/dL (ref 30.0–36.0)
MCV: 91.6 fL (ref 78.0–100.0)
PLATELETS: 199 10*3/uL (ref 150–400)
RBC: 4.29 MIL/uL (ref 3.87–5.11)
RDW: 15.5 % (ref 11.5–15.5)
WBC: 6.5 10*3/uL (ref 4.0–10.5)

## 2018-01-02 LAB — T4, FREE: Free T4: 1.57 ng/dL — ABNORMAL HIGH (ref 0.61–1.12)

## 2018-01-02 LAB — TSH: TSH: 0.217 u[IU]/mL — AB (ref 0.350–4.500)

## 2018-01-02 MED ORDER — AMIODARONE HCL 200 MG PO TABS: 200.0000 mg | ORAL_TABLET | Freq: Two times a day (BID) | ORAL | 3 refills | Status: DC

## 2018-01-02 NOTE — H&P (View-Only) (Signed)
Primary Care Physician: [name], MD Referring Physician: Davan Hark is a 72 y.o. female with a h/o a very complicated afib h/o s/p ablation 2009 at [facility] and  and convergent procedure at [facility], 0254 complicated by CHB and subsequent  PPM.  Currently on amiodarone and last was seen by Dr. Caryl Comes 12/15/17 and was doing well. She started not feeling well Wednesday and noted elevated HR's on Thursday, which have persisted. EKG shows today probable atypical atrial flutter with rvr at 123 bpm.  She fell this am on a throw rug mostly falling to her knees and  on rt shoulder. She is sore but did not want to go to there ER for evaluation. She has had periods of lightheadedness with being out of rhythm. She was offered to be sent to the ER for immediate cardioversion but  she declined. First available is scheduled for Wednesday 4/17. No missed doses of eliquis.  Today, she denies symptoms of  chest pain, shortness of breath, orthopnea, PND, lower extremity edema, dizziness, presyncope, syncope, or neurologic sequela.  + for fatigue, lightheadedness and palpitations.The patient is tolerating medications without difficulties and is otherwise without complaint today.   Past Medical History:  Diagnosis Date  . Anemia   . Arthritis    osteoarthritis - knees , history of  . Atrial fibrillation [facility])    ablation- 2x's-- 1st time- [facility], 2nd event at [facility] in 2008. Convergent ablation at [facility] 6/14  . Atrial fibrillation ([place])   . Blood transfusion without reported diagnosis   . Breast cancer ([place])    Dr Margot Chimes, total thyroidectomy- 1999- for cancer  . Chronic bilateral pleural effusions   . Colon polyp    Dr Earlean Shawl  . Complete heart block ([place])   . Complication of anesthesia    Ketamine produces LSD reaction, bright colored nightmarish experience   . Dyslipidemia   . Dyspnea   . H/O pleural effusion    s/p thoracentesis w 3256m withdrawn  . Hepatitis    [place] ?hepatitis   . History of dysphagia    due to radiation therapy  . History of hiatal hernia    small noted on PET scan  . History of kidney stones   . Hx of thyroid cancer    Dr SForde Dandy . Hyperlipidemia   . Hypothyroidism   . Lung cancer, [place] lobe [place] 01[place]   radiation RX completed 03/04/17; will start chemo 6/27, pt unaware of lung cancer  . Morbid obesity ([place]    Status post lap band surgery  . Non Hodgkin's lymphoma ([place]    on chemotherapy  . Persistent atrial fibrillation ([place]    a. s/p PVI 2008 b. s/p convergent ablation 22706complicated by bradycardia requiring pacemaker implant  . Presence of permanent cardiac pacemaker   . Sinus node dysfunction ([place]    Complicating convergent ablation 6/14  . [place]    [place]  . SVC syndrome    Past Surgical History:  Procedure Laterality Date  . ABDOMINAL HYSTERECTOMY  1983  . afib ablation     a. 2008 PVI b. 2014 convergent ablation  . APPENDECTOMY    . BONE MARROW BIOPSY  02/21/2017  . BREAST LUMPECTOMY  2010  . bso  1998  . CARDIOVERSION  10/09/2012   Procedure: CARDIOVERSION;  Surgeon: JMinus Breeding MD;  [place]  Service: Cardiovascular;  Laterality: N/A;  . CARDIOVERSION  10/09/2012   Procedure: CARDIOVERSION;  Surgeon: Minus Breeding, MD;  Location: [facility] ENDOSCOPY;  Service: Cardiovascular;  Laterality: N/A;  [name] gave the ok to add pt to the add on , but we must check to find out if the can add pt on at [phone] ( [phone])  . CARDIOVERSION N/A 11/20/2012   Procedure: CARDIOVERSION;  Surgeon: Fay Records, MD;  Location: [facility] ENDOSCOPY;  Service: Cardiovascular;  Laterality: N/A;  . CARDIOVERSION N/A 07/18/2017   Procedure: CARDIOVERSION;  Surgeon: Thayer Headings, MD;  Location: [facility] ENDOSCOPY;  Service: Cardiovascular;  Laterality: N/A;  . CARDIOVERSION N/A 10/03/2017   Procedure: CARDIOVERSION;  Surgeon: Sanda Klein, MD;  Location: [facility] ENDOSCOPY;  Service: Cardiovascular;   Laterality: N/A;  . CHOLECYSTECTOMY    . COLONOSCOPY W/ POLYPECTOMY     Dr Earlean Shawl  . CYSTOSCOPY N/A 02/06/2015   Procedure: CYSTOSCOPY;  Surgeon: Kathie Rhodes, MD;  Location: [facility] ORS;  Service: Urology;  Laterality: N/A;  . CYSTOSCOPY W/ RETROGRADES Left 11/17/2017   Procedure: CYSTOSCOPY WITH RETROGRADE /PYELOGRAM/;  Surgeon: Kathie Rhodes, MD;  Location: [facility]S;  Service: Urology;  Laterality: Left;  . CYSTOSCOPY WITH RETROGRADE PYELOGRAM, URETEROSCOPY AND STENT PLACEMENT Right 02/06/2015   Procedure: RETROGRADE PYELOGRAM, RIGHT URETEROSCOPY STENT PLACEMENT;  Surgeon: Kathie Rhodes, MD;  Location: [facility];  Service: Urology;  Laterality: Right;  . CYSTOSCOPY WITH RETROGRADE PYELOGRAM, URETEROSCOPY AND STENT PLACEMENT Right 03/07/2017   Procedure: CYSTOSCOPY WITH RIGHT RETROGRADE PYELOGRAM,RIGHT  URETEROSCOPYLASER LITHOTRIPSY  AND STENT PLACEMENT AND STONE BASKETRY;  Surgeon: Kathie Rhodes, MD;  Location: [place];  Service: Urology;  Laterality: Right;  . EYE SURGERY     cataract surgery  . HOLMIUM LASER APPLICATION N/A 7/56/4332   Procedure: HOLMIUM LASER APPLICATION;  Surgeon: Kathie Rhodes, MD;  Location: [facility];  Service: Urology;  Laterality: N/A;  . HOLMIUM LASER APPLICATION Right 9/51/8841   Procedure: HOLMIUM LASER APPLICATION;  Surgeon: Kathie Rhodes, MD;  Location[facility];  Service: Urology;  Laterality: Right;  . HOLMIUM LASER APPLICATION Left 6/60/6301   Procedure: HOLMIUM LASER APPLICATION;  Surgeon: Kathie Rhodes, MD;  Location: [facility];  Service: Urology;  Laterality: Left;  . IR FLUORO GUIDE PORT INSERTION RIGHT  02/24/2017  . IR NEPHROSTOMY PLACEMENT RIGHT  11/17/2017  . IR PATIENT EVAL TECH 0-60 MINS  03/11/2017  . IR US GUIDE VASC ACCESS RIGHT  02/24/2017  . KNEE ARTHROSCOPY     bilateral  . LAPAROSCOPIC GASTRIC BANDING  07/10/2010  . LAPAROTOMY     for ruptured ovary and ovarian artery   . NEPHROLITHOTOMY Right 11/17/2017   Procedure:  NEPHROLITHOTOMY PERCUTANEOUS;  Surgeon: Kathie Rhodes, MD;  [facility]: [facility];  Service: Urology;  Laterality: Right;  . PACEMAKER INSERTION  03/10/2013   MDT dual chamber PPM  . POCKET REVISION N/A 12/08/2013   Procedure: POCKET REVISION;  Surgeon: Deboraha Sprang, MD;  Location[facility] CATH LAB;  Service: Cardiovascular;  Laterality: N/A;  . PORTA CATH INSERTION    . TEE WITH CARDIOVERSION  09/22/2017  . TEE WITHOUT CARDIOVERSION N/A 10/03/2017   Procedure: TRANSESOPHAGEAL ECHOCARDIOGRAM (TEE);  Surgeon: Sanda Klein, MD;  Location: [place];  Service: Cardiovascular;  Laterality: N/A;  . THYROIDECTOMY  1998   Dr Margot Chimes  . TONSILLECTOMY    . TOTAL KNEE ARTHROPLASTY  04/13/2012   Procedure: TOTAL KNEE ARTHROPLASTY;  Surgeon: Rudean Haskell, MD;  Location: [place];  Service: Orthopedics;  Laterality: Right;  Marland Kitchen VIDEO BRONCHOSCOPY WITH ENDOBRONCHIAL ULTRASOUND N/A  02/07/2017   Procedure: VIDEO BRONCHOSCOPY WITH ENDOBRONCHIAL ULTRASOUND;  Surgeon: Marshell Garfinkel, MD;  Location: [place];  Service: Pulmonary;  Laterality: N/A;    Current Outpatient Medications  Medication Sig Dispense Refill  . amiodarone (PACERONE) 200 MG tablet Take 1 tablet (200 mg total) by mouth 2 (two) times daily. 90 tablet 3  . cycloSPORINE (RESTASIS) 0.05 % ophthalmic emulsion Place 1 drop into both eyes 2 (two) times daily.    . diphenhydramine-acetaminophen (TYLENOL PM) 25-500 MG TABS tablet Take 2 tablets by mouth at bedtime.     Marland Kitchen ELIQUIS 5 MG TABS tablet TAKE 1 TABLET BY MOUTH TWO  TIMES DAILY 180 tablet 1  . levothyroxine (SYNTHROID, LEVOTHROID) 175 MCG tablet Take 175 mcg by mouth See admin instructions. TAKE 1 TABLET (175 MCG) BY MOUTH DAILY, EXCEPT FRIDAYS.    Marland Kitchen Lifitegrast (XIIDRA) 5 % SOLN Apply 1 drop to eye 2 (two) times daily.    . Multiple Vitamin (MULTIVITAMIN) tablet Take 1 tablet by mouth daily.     Marland Kitchen oxyCODONE-acetaminophen (PERCOCET/ROXICET) 5-325 MG tablet Take 1 tablet by mouth daily as needed for  severe pain.    . furosemide (LASIX) 20 MG tablet Take 1 tablets daily till you see your cardiolgist (Patient not taking: Reported on 01/02/2018) 30 tablet 0   No current facility-administered medications for this encounter.     Allergies  Allergen Reactions  . Tikosyn [Dofetilide] Other (See Comments)    Prolonged QT interval Prolonged QT interval  . Xarelto [Rivaroxaban] Other (See Comments)    Nose Bleed X 6 hrs ; packing in ER  . Epinephrine Other (See Comments) and Rash    Dental form only (liquid). Patient stated she will become "out of it" she can hear you but cannot respond in normal fashion.  Marland Kitchen Ketamine     Hallucinations  . Oral Anesthetic [Benzocaine]   . Adhesive [Tape] Rash    Paper tape as well    Social History   Socioeconomic History  . Marital status: Single    Spouse name: Not on file  . Number of children: 0  . Years of education: Not on file  . Highest education level: Not on file  Occupational History  . 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    Employer: RETIRED  Social Needs  . Financial resource strain: Not on file  . Food insecurity:    Worry: Not on file    Inability: Not on file  . Transportation needs:    Medical: Not on file    Non-medical: Not on file  Tobacco Use  . Smoking status: Never Smoker  . Smokeless tobacco: Never Used  Substance and Sexual Activity  . Alcohol use: No  . Drug use: No  . Sexual activity: Never  Lifestyle  . Physical activity:    Days per week: Not on file    Minutes per session: Not on file  . Stress: Not on file  Relationships  . Social connections:    Talks on phone: Not on file    Gets together: Not on file    Attends religious service: Not on file    Active member of club or organization: Not on file    Attends meetings of clubs or organizations: Not on file    Relationship status: Not on file  . Intimate partner violence:    Fear of current or ex partner: Not on file     Emotionally abused: Not on file    Physically abused:  Not on file    Forced sexual activity: Not on file  Other Topics Concern  . Not on file  Social History Narrative   SINGLE    NO CHILDREN   NEVER SMOKED   EXERCISE 3 X WK          Family History  Problem Relation Age of Onset  . Heart disease Father 76       MI _0   . Colon cancer Father        COLON  . Heart attack Father   . Diabetes Sister   . Diabetes Brother   . Diabetes Paternal Aunt   . Diabetes Paternal Grandmother     ROS- All systems are reviewed and negative except as per the HPI above  Physical Exam: Vitals:   01/02/18 1420  BP: 108/82  Pulse: (!) 123  SpO2: 97%  Weight: 196 lb 12.8 oz (89.3 kg)  Height: _1  (1.676 m)   Wt Readings from Last 3 Encounters:  01/02/18 196 lb 12.8 oz (89.3 kg)  12/15/17 200 lb (90.7 kg)  11/27/17 200 lb 12.8 oz (91.1 kg)    Labs: Lab Results  Component Value Date   NA 140 11/27/2017   K 4.6 11/27/2017   CL 106 11/27/2017   CO2 26 11/27/2017   GLUCOSE 105 11/27/2017   BUN 22 11/27/2017   CREATININE 0.98 11/27/2017   CALCIUM 9.5 11/27/2017   PHOS 3.9 04/19/2013   MG 1.9 09/16/2017   Lab Results  Component Value Date   INR 1.01 11/17/2017   Lab Results  Component Value Date   CHOL 200 07/26/2013   HDL 51.80 07/26/2013   LDLCALC 116 (H) 07/26/2013   TRIG 162.0 (H) 07/26/2013     GEN- The patient is well appearing, alert and oriented x 3 today.   Head- normocephalic, atraumatic Eyes-  Sclera clear, conjunctiva pink Ears- hearing intact Oropharynx- clear Neck- supple, no JVP Lymph- no cervical lymphadenopathy Lungs- Clear to ausculation bilaterally, normal work of breathing Heart- irregular rate and rhythm, no murmurs, rubs or gallops, PMI not laterally displaced GI- soft, NT, ND, + BS Extremities- no clubbing, cyanosis, or edema MS- no significant deformity or atrophy Skin- no rash or lesion Psych- euthymic mood, full affect Neuro-  strength and sensation are intact  EKG-Atypical atrial flutter at 123 bpm, PR int 184 ms, qrs int 86 ms, qtc 469 ms Epic records reviewed     Assessment and Plan: 1. Afib/flutter Increase amiodarone to 200 mg bid Continue eliquis 5 mg bid for chadsvasc score of at least 4, states no missed doses x 3 weeks Scheduled for cardioversion 4/17 Labs today To ER this week end if lightheadedness worsens, for urgent cardioversion  Butch Penny C. , [facility] [address] [place], [place] [address] [phone]

## 2018-01-02 NOTE — Progress Notes (Signed)
Primary Care Physician: [name], MD Referring Physician: Davan Hark is a 72 y.o. female with a h/o a very complicated afib h/o s/p ablation 2009 at [facility] and  and convergent procedure at [facility], 0254 complicated by CHB and subsequent  PPM.  Currently on amiodarone and last was seen by Dr. Caryl Comes 12/15/17 and was doing well. She started not feeling well Wednesday and noted elevated HR's on Thursday, which have persisted. EKG shows today probable atypical atrial flutter with rvr at 123 bpm.  She fell this am on a throw rug mostly falling to her knees and  on rt shoulder. She is sore but did not want to go to there ER for evaluation. She has had periods of lightheadedness with being out of rhythm. She was offered to be sent to the ER for immediate cardioversion but  she declined. First available is scheduled for Wednesday 4/17. No missed doses of eliquis.  Today, she denies symptoms of  chest pain, shortness of breath, orthopnea, PND, lower extremity edema, dizziness, presyncope, syncope, or neurologic sequela.  + for fatigue, lightheadedness and palpitations.The patient is tolerating medications without difficulties and is otherwise without complaint today.   Past Medical History:  Diagnosis Date  . Anemia   . Arthritis    osteoarthritis - knees , history of  . Atrial fibrillation [facility])    ablation- 2x's-- 1st time- [facility], 2nd event at [facility] in 2008. Convergent ablation at [facility] 6/14  . Atrial fibrillation ([place])   . Blood transfusion without reported diagnosis   . Breast cancer ([place])    Dr Margot Chimes, total thyroidectomy- 1999- for cancer  . Chronic bilateral pleural effusions   . Colon polyp    Dr Earlean Shawl  . Complete heart block ([place])   . Complication of anesthesia    Ketamine produces LSD reaction, bright colored nightmarish experience   . Dyslipidemia   . Dyspnea   . H/O pleural effusion    s/p thoracentesis w 3256m withdrawn  . Hepatitis    [place] ?hepatitis   . History of dysphagia    due to radiation therapy  . History of hiatal hernia    small noted on PET scan  . History of kidney stones   . Hx of thyroid cancer    Dr SForde Dandy . Hyperlipidemia   . Hypothyroidism   . Lung cancer, [place] lobe ([place] 01[place]   radiation RX completed 03/04/17; will start chemo 6/27, pt unaware of lung cancer  . Morbid obesity ([place]    Status post lap band surgery  . Non Hodgkin's lymphoma ([place]    on chemotherapy  . Persistent atrial fibrillation ([place]    a. s/p PVI 2008 b. s/p convergent ablation 22706complicated by bradycardia requiring pacemaker implant  . Presence of permanent cardiac pacemaker   . Sinus node dysfunction ([place]    Complicating convergent ablation 6/14  . [place]    [place]  . SVC syndrome    Past Surgical History:  Procedure Laterality Date  . ABDOMINAL HYSTERECTOMY  1983  . afib ablation     a. 2008 PVI b. 2014 convergent ablation  . APPENDECTOMY    . BONE MARROW BIOPSY  02/21/2017  . BREAST LUMPECTOMY  2010  . bso  1998  . CARDIOVERSION  10/09/2012   Procedure: CARDIOVERSION;  Surgeon: JMinus Breeding MD;  [place]  Service: Cardiovascular;  Laterality: N/A;  . CARDIOVERSION  10/09/2012   Procedure: CARDIOVERSION;  Surgeon: Minus Breeding, MD;  Location: [facility] ENDOSCOPY;  Service: Cardiovascular;  Laterality: N/A;  [name] gave the ok to add pt to the add on , but we must check to find out if the can add pt on at [phone] ( [phone])  . CARDIOVERSION N/A 11/20/2012   Procedure: CARDIOVERSION;  Surgeon: Fay Records, MD;  Location: [facility] ENDOSCOPY;  Service: Cardiovascular;  Laterality: N/A;  . CARDIOVERSION N/A 07/18/2017   Procedure: CARDIOVERSION;  Surgeon: Thayer Headings, MD;  Location: [facility] ENDOSCOPY;  Service: Cardiovascular;  Laterality: N/A;  . CARDIOVERSION N/A 10/03/2017   Procedure: CARDIOVERSION;  Surgeon: Sanda Klein, MD;  Location: [facility] ENDOSCOPY;  Service: Cardiovascular;   Laterality: N/A;  . CHOLECYSTECTOMY    . COLONOSCOPY W/ POLYPECTOMY     Dr Earlean Shawl  . CYSTOSCOPY N/A 02/06/2015   Procedure: CYSTOSCOPY;  Surgeon: Kathie Rhodes, MD;  Location: [facility] ORS;  Service: Urology;  Laterality: N/A;  . CYSTOSCOPY W/ RETROGRADES Left 11/17/2017   Procedure: CYSTOSCOPY WITH RETROGRADE /PYELOGRAM/;  Surgeon: Kathie Rhodes, MD;  Location: [facility] ORS;  Service: Urology;  Laterality: Left;  . CYSTOSCOPY WITH RETROGRADE PYELOGRAM, URETEROSCOPY AND STENT PLACEMENT Right 02/06/2015   Procedure: RETROGRADE PYELOGRAM, RIGHT URETEROSCOPY STENT PLACEMENT;  Surgeon: Kathie Rhodes, MD;  Location: [facility];  Service: Urology;  Laterality: Right;  . CYSTOSCOPY WITH RETROGRADE PYELOGRAM, URETEROSCOPY AND STENT PLACEMENT Right 03/07/2017   Procedure: CYSTOSCOPY WITH RIGHT RETROGRADE PYELOGRAM,RIGHT  URETEROSCOPYLASER LITHOTRIPSY  AND STENT PLACEMENT AND STONE BASKETRY;  Surgeon: Kathie Rhodes, MD;  Location: [place];  Service: Urology;  Laterality: Right;  . EYE SURGERY     cataract surgery  . HOLMIUM LASER APPLICATION N/A 7/56/4332   Procedure: HOLMIUM LASER APPLICATION;  Surgeon: Kathie Rhodes, MD;  [facility]: [facility];  Service: Urology;  Laterality: N/A;  . HOLMIUM LASER APPLICATION Right 9/51/8841   Procedure: HOLMIUM LASER APPLICATION;  Surgeon: Kathie Rhodes, MD;  Location: [facility];  Service: Urology;  Laterality: Right;  . HOLMIUM LASER APPLICATION Left 6/60/6301   Procedure: HOLMIUM LASER APPLICATION;  Surgeon: Kathie Rhodes, MD;  [facility]: [facility];  Service: Urology;  Laterality: Left;  . IR FLUORO GUIDE PORT INSERTION RIGHT  02/24/2017  . IR NEPHROSTOMY PLACEMENT RIGHT  11/17/2017  . IR PATIENT EVAL TECH 0-60 MINS  03/11/2017  . IR US GUIDE VASC ACCESS RIGHT  02/24/2017  . KNEE ARTHROSCOPY     bilateral  . LAPAROSCOPIC GASTRIC BANDING  07/10/2010  . LAPAROTOMY     for ruptured ovary and ovarian artery   . NEPHROLITHOTOMY Right 11/17/2017   Procedure:  NEPHROLITHOTOMY PERCUTANEOUS;  Surgeon: Kathie Rhodes, MD;  [facility];  Service: Urology;  Laterality: Right;  . PACEMAKER INSERTION  03/10/2013   MDT dual chamber PPM  . POCKET REVISION N/A 12/08/2013   Procedure: POCKET REVISION;  Surgeon: Deboraha Sprang, MD;  Location[facility] CATH LAB;  Service: Cardiovascular;  Laterality: N/A;  . PORTA CATH INSERTION    . TEE WITH CARDIOVERSION  09/22/2017  . TEE WITHOUT CARDIOVERSION N/A 10/03/2017   Procedure: TRANSESOPHAGEAL ECHOCARDIOGRAM (TEE);  Surgeon: Sanda Klein, MD;  Location: [place];  Service: Cardiovascular;  Laterality: N/A;  . THYROIDECTOMY  1998   Dr Margot Chimes  . TONSILLECTOMY    . TOTAL KNEE ARTHROPLASTY  04/13/2012   Procedure: TOTAL KNEE ARTHROPLASTY;  Surgeon: Rudean Haskell, MD;  Location: [place];  Service: Orthopedics;  Laterality: Right;  Marland Kitchen VIDEO BRONCHOSCOPY WITH ENDOBRONCHIAL ULTRASOUND N/A  02/07/2017   Procedure: VIDEO BRONCHOSCOPY WITH ENDOBRONCHIAL ULTRASOUND;  Surgeon: Marshell Garfinkel, MD;  Location: [place];  Service: Pulmonary;  Laterality: N/A;    Current Outpatient Medications  Medication Sig Dispense Refill  . amiodarone (PACERONE) 200 MG tablet Take 1 tablet (200 mg total) by mouth 2 (two) times daily. 90 tablet 3  . cycloSPORINE (RESTASIS) 0.05 % ophthalmic emulsion Place 1 drop into both eyes 2 (two) times daily.    . diphenhydramine-acetaminophen (TYLENOL PM) 25-500 MG TABS tablet Take 2 tablets by mouth at bedtime.     Marland Kitchen ELIQUIS 5 MG TABS tablet TAKE 1 TABLET BY MOUTH TWO  TIMES DAILY 180 tablet 1  . levothyroxine (SYNTHROID, LEVOTHROID) 175 MCG tablet Take 175 mcg by mouth See admin instructions. TAKE 1 TABLET (175 MCG) BY MOUTH DAILY, EXCEPT FRIDAYS.    Marland Kitchen Lifitegrast (XIIDRA) 5 % SOLN Apply 1 drop to eye 2 (two) times daily.    . Multiple Vitamin (MULTIVITAMIN) tablet Take 1 tablet by mouth daily.     Marland Kitchen oxyCODONE-acetaminophen (PERCOCET/ROXICET) 5-325 MG tablet Take 1 tablet by mouth daily as needed for  severe pain.    . furosemide (LASIX) 20 MG tablet Take 1 tablets daily till you see your cardiolgist (Patient not taking: Reported on 01/02/2018) 30 tablet 0   No current facility-administered medications for this encounter.     Allergies  Allergen Reactions  . Tikosyn [Dofetilide] Other (See Comments)    Prolonged QT interval Prolonged QT interval  . Xarelto [Rivaroxaban] Other (See Comments)    Nose Bleed X 6 hrs ; packing in ER  . Epinephrine Other (See Comments) and Rash    Dental form only (liquid). Patient stated she will become "out of it" she can hear you but cannot respond in normal fashion.  Marland Kitchen Ketamine     Hallucinations  . Oral Anesthetic [Benzocaine]   . Adhesive [Tape] Rash    Paper tape as well    Social History   Socioeconomic History  . Marital status: Single    Spouse name: Not on file  . Number of children: 0  . Years of education: Not on file  . Highest education level: Not on file  Occupational History  . 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    Employer: RETIRED  Social Needs  . Financial resource strain: Not on file  . Food insecurity:    Worry: Not on file    Inability: Not on file  . Transportation needs:    Medical: Not on file    Non-medical: Not on file  Tobacco Use  . Smoking status: Never Smoker  . Smokeless tobacco: Never Used  Substance and Sexual Activity  . Alcohol use: No  . Drug use: No  . Sexual activity: Never  Lifestyle  . Physical activity:    Days per week: Not on file    Minutes per session: Not on file  . Stress: Not on file  Relationships  . Social connections:    Talks on phone: Not on file    Gets together: Not on file    Attends religious service: Not on file    Active member of club or organization: Not on file    Attends meetings of clubs or organizations: Not on file    Relationship status: Not on file  . Intimate partner violence:    Fear of current or ex partner: Not on file     Emotionally abused: Not on file    Physically abused:  Not on file    Forced sexual activity: Not on file  Other Topics Concern  . Not on file  Social History Narrative   SINGLE    NO CHILDREN   NEVER SMOKED   EXERCISE 3 X WK          Family History  Problem Relation Age of Onset  . Heart disease Father 76       MI _0   . Colon cancer Father        COLON  . Heart attack Father   . Diabetes Sister   . Diabetes Brother   . Diabetes Paternal Aunt   . Diabetes Paternal Grandmother     ROS- All systems are reviewed and negative except as per the HPI above  Physical Exam: Vitals:   01/02/18 1420  BP: 108/82  Pulse: (!) 123  SpO2: 97%  Weight: 196 lb 12.8 oz (89.3 kg)  Height: _1  (1.676 m)   Wt Readings from Last 3 Encounters:  01/02/18 196 lb 12.8 oz (89.3 kg)  12/15/17 200 lb (90.7 kg)  11/27/17 200 lb 12.8 oz (91.1 kg)    Labs: Lab Results  Component Value Date   NA 140 11/27/2017   K 4.6 11/27/2017   CL 106 11/27/2017   CO2 26 11/27/2017   GLUCOSE 105 11/27/2017   BUN 22 11/27/2017   CREATININE 0.98 11/27/2017   CALCIUM 9.5 11/27/2017   PHOS 3.9 04/19/2013   MG 1.9 09/16/2017   Lab Results  Component Value Date   INR 1.01 11/17/2017   Lab Results  Component Value Date   CHOL 200 07/26/2013   HDL 51.80 07/26/2013   LDLCALC 116 (H) 07/26/2013   TRIG 162.0 (H) 07/26/2013     GEN- The patient is well appearing, alert and oriented x 3 today.   Head- normocephalic, atraumatic Eyes-  Sclera clear, conjunctiva pink Ears- hearing intact Oropharynx- clear Neck- supple, no JVP Lymph- no cervical lymphadenopathy Lungs- Clear to ausculation bilaterally, normal work of breathing Heart- irregular rate and rhythm, no murmurs, rubs or gallops, PMI not laterally displaced GI- soft, NT, ND, + BS Extremities- no clubbing, cyanosis, or edema MS- no significant deformity or atrophy Skin- no rash or lesion Psych- euthymic mood, full affect Neuro-  strength and sensation are intact  EKG-Atypical atrial flutter at 123 bpm, PR int 184 ms, qrs int 86 ms, qtc 469 ms Epic records reviewed     Assessment and Plan: 1. Afib/flutter Increase amiodarone to 200 mg bid Continue eliquis 5 mg bid for chadsvasc score of at least 4, states no missed doses x 3 weeks Scheduled for cardioversion 4/17 Labs today To ER this week end if lightheadedness worsens, for urgent cardioversion  Butch Penny C. Nereyda Bowler, [facility] [address] [place], [place] [address] [phone]

## 2018-01-02 NOTE — Patient Instructions (Signed)
Increase amiodarone to 200mg  twice a day until we see you back in follow up  Cardioversion scheduled for Wednesday, April 17th  - Arrive at the [facility] and go to admitting at 8:30am  -Do not eat or drink anything after midnight the night prior to your procedure.  - Take all your medication with a sip of water prior to arrival.  - You will not be able to drive home after your procedure.

## 2018-01-07 ENCOUNTER — Ambulatory Visit (HOSPITAL_COMMUNITY): Payer: Medicare Other | Admitting: Certified Registered Nurse Anesthetist

## 2018-01-07 ENCOUNTER — Encounter (HOSPITAL_COMMUNITY): Payer: Self-pay

## 2018-01-07 ENCOUNTER — Encounter (HOSPITAL_COMMUNITY)
Admission: RE | Disposition: A | Discharge status: Home / Self Care | Payer: Self-pay | Source: Ambulatory Visit | Attending: Cardiology

## 2018-01-07 ENCOUNTER — Ambulatory Visit (HOSPITAL_COMMUNITY)
Admission: RE | Admit: 2018-01-07 | Discharge: 2018-01-07 | Disposition: A | Discharge status: Home / Self Care | Payer: Medicare Other | Source: Ambulatory Visit | Attending: Cardiology | Admitting: Cardiology

## 2018-01-07 DIAGNOSIS — Z8249 Family history of ischemic heart disease and other diseases of the circulatory system: Secondary | ICD-10-CM | POA: Diagnosis not present

## 2018-01-07 DIAGNOSIS — M170 Bilateral primary osteoarthritis of knee: Secondary | ICD-10-CM | POA: Diagnosis not present

## 2018-01-07 DIAGNOSIS — D649 Anemia, unspecified: Secondary | ICD-10-CM | POA: Insufficient documentation

## 2018-01-07 DIAGNOSIS — Z8673 Personal history of transient ischemic attack (TIA), and cerebral infarction without residual deficits: Secondary | ICD-10-CM | POA: Insufficient documentation

## 2018-01-07 DIAGNOSIS — E039 Hypothyroidism, unspecified: Secondary | ICD-10-CM | POA: Diagnosis not present

## 2018-01-07 DIAGNOSIS — G4730 Sleep apnea, unspecified: Secondary | ICD-10-CM | POA: Diagnosis not present

## 2018-01-07 DIAGNOSIS — G4733 Obstructive sleep apnea (adult) (pediatric): Secondary | ICD-10-CM | POA: Diagnosis not present

## 2018-01-07 DIAGNOSIS — E785 Hyperlipidemia, unspecified: Secondary | ICD-10-CM | POA: Diagnosis not present

## 2018-01-07 DIAGNOSIS — I4819 Other persistent atrial fibrillation: Secondary | ICD-10-CM

## 2018-01-07 DIAGNOSIS — I5032 Chronic diastolic (congestive) heart failure: Secondary | ICD-10-CM | POA: Diagnosis not present

## 2018-01-07 DIAGNOSIS — I442 Atrioventricular block, complete: Secondary | ICD-10-CM | POA: Insufficient documentation

## 2018-01-07 DIAGNOSIS — I481 Persistent atrial fibrillation: Secondary | ICD-10-CM | POA: Diagnosis not present

## 2018-01-07 DIAGNOSIS — Z7901 Long term (current) use of anticoagulants: Secondary | ICD-10-CM | POA: Insufficient documentation

## 2018-01-07 DIAGNOSIS — Z923 Personal history of irradiation: Secondary | ICD-10-CM | POA: Diagnosis not present

## 2018-01-07 DIAGNOSIS — I4891 Unspecified atrial fibrillation: Secondary | ICD-10-CM | POA: Diagnosis not present

## 2018-01-07 DIAGNOSIS — Z9221 Personal history of antineoplastic chemotherapy: Secondary | ICD-10-CM | POA: Insufficient documentation

## 2018-01-07 HISTORY — PX: CARDIOVERSION: SHX1299

## 2018-01-07 SURGERY — CARDIOVERSION
Anesthesia: General

## 2018-01-07 MED ORDER — LIDOCAINE 2% (20 MG/ML) 5 ML SYRINGE
INTRAMUSCULAR | Status: DC | PRN
  Administered 2018-01-07: 80 mg via INTRAVENOUS

## 2018-01-07 MED ORDER — SODIUM CHLORIDE 0.9 % IV SOLN
INTRAVENOUS | Status: DC | PRN
  Administered 2018-01-07: 10:00:00 via INTRAVENOUS

## 2018-01-07 MED ORDER — PROPOFOL 10 MG/ML IV BOLUS
INTRAVENOUS | Status: DC | PRN
  Administered 2018-01-07: 20 mg via INTRAVENOUS
  Administered 2018-01-07: 80 mg via INTRAVENOUS

## 2018-01-07 NOTE — Anesthesia Preprocedure Evaluation (Addendum)
Anesthesia Evaluation  Patient identified by MRN, date of birth, ID band Patient awake    Reviewed: Allergy & Precautions, Patient's Chart, lab work & pertinent test results  Airway Mallampati: II  TM Distance: >3 FB Neck ROM: Full    Dental no notable dental hx.    Pulmonary shortness of breath and at rest, sleep apnea ,    Pulmonary exam normal breath sounds clear to auscultation       Cardiovascular negative cardio ROS Normal cardiovascular exam+ dysrhythmias Atrial Fibrillation + pacemaker  Rhythm:Regular Rate:Normal     Neuro/Psych negative neurological ROS     GI/Hepatic negative GI ROS, Neg liver ROS,   Endo/Other  negative endocrine ROS  Renal/GU negative Renal ROS     Musculoskeletal negative musculoskeletal ROS (+)   Abdominal   Peds  Hematology  (+) anemia ,   Anesthesia Other Findings   Reproductive/Obstetrics                           Anesthesia Physical Anesthesia Plan  ASA: III  Anesthesia Plan: General   Post-op Pain Management:    Induction: Intravenous  PONV Risk Score and Plan:   Airway Management Planned: Mask, Natural Airway and Nasal Cannula  Additional Equipment:   Intra-op Plan:   Post-operative Plan:   Informed Consent: I have reviewed the patients History and Physical, chart, labs and discussed the procedure including the risks, benefits and alternatives for the proposed anesthesia with the patient or authorized representative who has indicated his/her understanding and acceptance.     Plan Discussed with:   Anesthesia Plan Comments:         Anesthesia Quick Evaluation

## 2018-01-07 NOTE — Interval H&P Note (Signed)
History and Physical Interval Note:  01/07/2018 10:22 AM  Kayla Harrison  has presented today for surgery, with the diagnosis of AFIB  The various methods of treatment have been discussed with the patient and family. After consideration of risks, benefits and other options for treatment, the patient has consented to  Procedure(s): CARDIOVERSION (N/A) as a surgical intervention .  The patient's history has been reviewed, patient examined, no change in status, stable for surgery.  I have reviewed the patient's chart and labs.  Questions were answered to the patient's satisfaction.     Mertie Moores

## 2018-01-07 NOTE — CV Procedure (Signed)
    Cardioversion Note  Kayla Harrison [id number] [birth year]  Procedure: DC Cardioversion Indications: atrial fib   Procedure Details Consent: Obtained Time Out: Verified patient identification, verified procedure, site/side was marked, verified correct patient position, special equipment/implants available, Radiology Safety Procedures followed,  medications/allergies/relevent history reviewed, required imaging and test results available.  Performed  The patient has been on adequate anticoagulation.  The patient received IV Lidocaine 80 mg IV followed by Propofol 100 mg IV  for sedation.  Synchronous cardioversion was performed at 120 mg  joules.  The cardioversion was successful     Complications: No apparent complications Patient did tolerate procedure well.   Thayer Headings, Brooke Bonito., MD, [facility] 01/07/2018, 10:34 AM

## 2018-01-07 NOTE — Anesthesia Postprocedure Evaluation (Signed)
Anesthesia Post Note  Patient: Kayla Harrison  Procedure(s) Performed: CARDIOVERSION (N/A )     Patient location during evaluation: PACU Anesthesia Type: General Level of consciousness: awake and alert Pain management: pain level controlled Vital Signs Assessment: post-procedure vital signs reviewed and stable Respiratory status: spontaneous breathing, nonlabored ventilation, respiratory function stable and patient connected to nasal cannula oxygen Cardiovascular status: blood pressure returned to baseline and stable Postop Assessment: no apparent nausea or vomiting Anesthetic complications: no    Last Vitals:  Vitals:   01/07/18 1038 01/07/18 1050  BP: 121/61 108/60  Pulse: 74 75  Resp: (!) 27 15  Temp: 36.6 C   SpO2: 99% 100%    Last Pain:  Vitals:   01/07/18 1050  TempSrc:   PainSc: 0-No pain                 Barnet Glasgow

## 2018-01-07 NOTE — Transfer of Care (Signed)
Immediate Anesthesia Transfer of Care Note  Patient: Kayla Harrison  Procedure(s) Performed: CARDIOVERSION (N/A )  Patient Location: PACU and Endoscopy Unit  Anesthesia Type:General  Level of Consciousness: awake, patient cooperative and responds to stimulation  Airway & Oxygen Therapy: Patient Spontanous Breathing  Post-op Assessment: Report given to RN and Post -op Vital signs reviewed and stable  Post vital signs: Reviewed and stable  Last Vitals:  Vitals Value Taken Time  BP    Temp    Pulse    Resp    SpO2      Last Pain:  Vitals:   01/07/18 0907  PainSc: 0-No pain         Complications: No apparent anesthesia complications

## 2018-01-07 NOTE — Addendum Note (Signed)
Addendum  created 01/07/18 1113 by Cortland Crehan, Dahlia Bailiff, CRNA   Intraprocedure Flowsheets edited

## 2018-01-14 ENCOUNTER — Ambulatory Visit (HOSPITAL_COMMUNITY)
Admission: RE | Admit: 2018-01-14 | Discharge: 2018-01-14 | Disposition: A | Discharge status: Home / Self Care | Payer: Medicare Other | Source: Ambulatory Visit | Attending: Nurse Practitioner | Admitting: Nurse Practitioner

## 2018-01-14 ENCOUNTER — Encounter (HOSPITAL_COMMUNITY): Payer: Self-pay | Admitting: Nurse Practitioner

## 2018-01-14 VITALS — BP 106/74 | HR 75 | Ht 66.0 in | Wt 199.0 lb

## 2018-01-14 DIAGNOSIS — Z960 Presence of urogenital implants: Secondary | ICD-10-CM | POA: Insufficient documentation

## 2018-01-14 DIAGNOSIS — Z9889 Other specified postprocedural states: Secondary | ICD-10-CM | POA: Insufficient documentation

## 2018-01-14 DIAGNOSIS — Z96651 Presence of right artificial knee joint: Secondary | ICD-10-CM | POA: Insufficient documentation

## 2018-01-14 DIAGNOSIS — Z8249 Family history of ischemic heart disease and other diseases of the circulatory system: Secondary | ICD-10-CM | POA: Insufficient documentation

## 2018-01-14 DIAGNOSIS — Z9049 Acquired absence of other specified parts of digestive tract: Secondary | ICD-10-CM | POA: Insufficient documentation

## 2018-01-14 DIAGNOSIS — Z6832 Body mass index (BMI) 32.0-32.9, adult: Secondary | ICD-10-CM | POA: Diagnosis not present

## 2018-01-14 DIAGNOSIS — Z888 Allergy status to other drugs, medicaments and biological substances status: Secondary | ICD-10-CM | POA: Insufficient documentation

## 2018-01-14 DIAGNOSIS — Z9221 Personal history of antineoplastic chemotherapy: Secondary | ICD-10-CM | POA: Diagnosis not present

## 2018-01-14 DIAGNOSIS — I442 Atrioventricular block, complete: Secondary | ICD-10-CM | POA: Insufficient documentation

## 2018-01-14 DIAGNOSIS — M810 Age-related osteoporosis without current pathological fracture: Secondary | ICD-10-CM | POA: Diagnosis not present

## 2018-01-14 DIAGNOSIS — Z79890 Hormone replacement therapy (postmenopausal): Secondary | ICD-10-CM | POA: Insufficient documentation

## 2018-01-14 DIAGNOSIS — Z9884 Bariatric surgery status: Secondary | ICD-10-CM | POA: Insufficient documentation

## 2018-01-14 DIAGNOSIS — Z923 Personal history of irradiation: Secondary | ICD-10-CM | POA: Diagnosis not present

## 2018-01-14 DIAGNOSIS — Z87442 Personal history of urinary calculi: Secondary | ICD-10-CM | POA: Diagnosis not present

## 2018-01-14 DIAGNOSIS — Z7901 Long term (current) use of anticoagulants: Secondary | ICD-10-CM | POA: Insufficient documentation

## 2018-01-14 DIAGNOSIS — Z86010 Personal history of colonic polyps: Secondary | ICD-10-CM | POA: Diagnosis not present

## 2018-01-14 DIAGNOSIS — Z833 Family history of diabetes mellitus: Secondary | ICD-10-CM | POA: Insufficient documentation

## 2018-01-14 DIAGNOSIS — E785 Hyperlipidemia, unspecified: Secondary | ICD-10-CM | POA: Diagnosis not present

## 2018-01-14 DIAGNOSIS — M1990 Unspecified osteoarthritis, unspecified site: Secondary | ICD-10-CM | POA: Insufficient documentation

## 2018-01-14 DIAGNOSIS — E039 Hypothyroidism, unspecified: Secondary | ICD-10-CM | POA: Diagnosis not present

## 2018-01-14 DIAGNOSIS — Z79899 Other long term (current) drug therapy: Secondary | ICD-10-CM | POA: Diagnosis not present

## 2018-01-14 DIAGNOSIS — Z800 Family history of malignant neoplasm of digestive organs: Secondary | ICD-10-CM | POA: Insufficient documentation

## 2018-01-14 DIAGNOSIS — I4819 Other persistent atrial fibrillation: Secondary | ICD-10-CM

## 2018-01-14 DIAGNOSIS — Z884 Allergy status to anesthetic agent status: Secondary | ICD-10-CM | POA: Insufficient documentation

## 2018-01-14 DIAGNOSIS — I4892 Unspecified atrial flutter: Secondary | ICD-10-CM | POA: Insufficient documentation

## 2018-01-14 DIAGNOSIS — Z85118 Personal history of other malignant neoplasm of bronchus and lung: Secondary | ICD-10-CM | POA: Diagnosis not present

## 2018-01-14 DIAGNOSIS — I481 Persistent atrial fibrillation: Secondary | ICD-10-CM | POA: Diagnosis not present

## 2018-01-14 DIAGNOSIS — Z85850 Personal history of malignant neoplasm of thyroid: Secondary | ICD-10-CM | POA: Diagnosis not present

## 2018-01-14 DIAGNOSIS — Z853 Personal history of malignant neoplasm of breast: Secondary | ICD-10-CM | POA: Insufficient documentation

## 2018-01-14 DIAGNOSIS — Z950 Presence of cardiac pacemaker: Secondary | ICD-10-CM | POA: Diagnosis not present

## 2018-01-14 DIAGNOSIS — Z8673 Personal history of transient ischemic attack (TIA), and cerebral infarction without residual deficits: Secondary | ICD-10-CM | POA: Insufficient documentation

## 2018-01-14 DIAGNOSIS — Z90710 Acquired absence of both cervix and uterus: Secondary | ICD-10-CM | POA: Insufficient documentation

## 2018-01-14 NOTE — Progress Notes (Signed)
Primary Care Physician: [name], MD Referring Physician: Margerite Impastato is a 72 y.o. female with a h/o a very complicated afib h/o s/p ablation 2009 at [facility] and  and convergent procedure at [facility], 1287 complicated by CHB and subsequent  PPM.  Currently on amiodarone and last was seen by Dr. Caryl Comes 12/15/17 and was doing well. She started not feeling well Wednesday and noted elevated HR's on Thursday, which have persisted. EKG shows today probable atypical atrial flutter with rvr at 123 bpm.  She fell this am on a throw rug mostly falling to her knees and  on rt shoulder. She is sore but did not want to go to there ER for evaluation. She has had periods of lightheadedness with being out of rhythm. She was offered to be sent to the ER for immediate cardioversion but  she declined. First available is scheduled for Wednesday 4/17. No missed doses of eliquis.  F/u successful cardioversion in afib clinic, 4/24. She continues to stay in [place]. Feels much better. No further falls.   Today, she denies symptoms of  chest pain, shortness of breath, orthopnea, PND, lower extremity edema, dizziness, presyncope, syncope, or neurologic sequela.  .The patient is tolerating medications without difficulties and is otherwise without complaint today.   Past Medical History:  Diagnosis Date  . Anemia   . Arthritis    osteoarthritis - knees , history of  . Atrial fibrillation [facility])    ablation- 2x's-- 1st time- [facility], 2nd event at [facility] in 2008. Convergent ablation at [facility] 6/14  . Atrial fibrillation ([place])   . Blood transfusion without reported diagnosis   . Breast cancer ([place])    Dr Margot Chimes, total thyroidectomy- 1999- for cancer  . Chronic bilateral pleural effusions   . Colon polyp    Dr Earlean Shawl  . Complete heart block ([place])   . Complication of anesthesia    Ketamine produces LSD reaction, bright colored nightmarish experience   . Dyslipidemia   . Dyspnea   . H/O pleural effusion    s/p  thoracentesis w 3265m withdrawn  . Hepatitis    [place]hepatitis   . History of dysphagia    due to radiation therapy  . History of hiatal hernia    small noted on PET scan  . History of kidney stones   . Hx of thyroid cancer    Dr SForde Dandy . Hyperlipidemia   . Hypothyroidism   . Lung cancer, lower lobe ([place] 01/2017   radiation RX completed 03/04/17; will start chemo 6/27, pt unaware of lung cancer  . Morbid obesity ([place]    Status post lap band surgery  . Non Hodgkin's lymphoma ([place]    on chemotherapy  . Persistent atrial fibrillation ([place]    a. s/p PVI 2008 b. s/p convergent ablation [id number] by bradycardia requiring pacemaker implant  . Presence of permanent cardiac pacemaker   . Sinus node dysfunction ([place]    Complicating convergent ablation 6/14  . [place] ([facility]    [place]  . SVC syndrome    Past Surgical History:  Procedure Laterality Date  . ABDOMINAL HYSTERECTOMY  1983  . afib ablation     a. 2008 PVI b. 2014 convergent ablation  . APPENDECTOMY    . BONE MARROW BIOPSY  02/21/2017  . BREAST LUMPECTOMY  2010  . bso  1998  . CARDIOVERSION  10/09/2012   Procedure: CARDIOVERSION;  Surgeon:  Minus Breeding, MD;  Location: [place];  Service: Cardiovascular;  Laterality: N/A;  . CARDIOVERSION  10/09/2012   Procedure: CARDIOVERSION;  Surgeon: Minus Breeding, MD;  Location: [facility] ENDOSCOPY;  Service: Cardiovascular;  Laterality: N/A;  [name] gave the ok to add pt to the add on , but we must check to find out if the can add pt on at [phone] ( [phone])  . CARDIOVERSION N/A 11/20/2012   Procedure: CARDIOVERSION;  Surgeon: Fay Records, MD;  Location: [facility] ENDOSCOPY;  Service: Cardiovascular;  Laterality: N/A;  . CARDIOVERSION N/A 07/18/2017   Procedure: CARDIOVERSION;  Surgeon: Thayer Headings, MD;  Location: [facility] ENDOSCOPY;  Service: Cardiovascular;  Laterality: N/A;  . CARDIOVERSION N/A 10/03/2017   Procedure: CARDIOVERSION;  Surgeon:  Sanda Klein, MD;  Location: [facility] ENDOSCOPY;  Service: Cardiovascular;  Laterality: N/A;  . CARDIOVERSION N/A 01/07/2018   Procedure: CARDIOVERSION;  Surgeon: Acie Fredrickson Wonda Cheng, MD;  Location: [place];  Service: Cardiovascular;  Laterality: N/A;  . CHOLECYSTECTOMY    . COLONOSCOPY W/ POLYPECTOMY     Dr Earlean Shawl  . CYSTOSCOPY N/A 02/06/2015   Procedure: CYSTOSCOPY;  Surgeon: Kathie Rhodes, MD;  Location: [facility];  Service: Urology;  Laterality: N/A;  . CYSTOSCOPY W/ RETROGRADES Left 11/17/2017   Procedure: CYSTOSCOPY WITH RETROGRADE /PYELOGRAM/;  Surgeon: Kathie Rhodes, MD;  Location: [facility];  Service: Urology;  Laterality: Left;  . CYSTOSCOPY WITH RETROGRADE PYELOGRAM, URETEROSCOPY AND STENT PLACEMENT Right 02/06/2015   Procedure: RETROGRADE PYELOGRAM, RIGHT URETEROSCOPY STENT PLACEMENT;  Surgeon: Kathie Rhodes, MD;  [facility];  Service: Urology;  Laterality: Right;  . CYSTOSCOPY WITH RETROGRADE PYELOGRAM, URETEROSCOPY AND STENT PLACEMENT Right 03/07/2017   Procedure: CYSTOSCOPY WITH RIGHT RETROGRADE PYELOGRAM,RIGHT  URETEROSCOPYLASER LITHOTRIPSY  AND STENT PLACEMENT AND STONE BASKETRY;  Surgeon: Kathie Rhodes, MD;  Location: [place];  Service: Urology;  Laterality: Right;  . EYE SURGERY     cataract surgery  . HOLMIUM LASER APPLICATION N/A 3/66/4403   Procedure: HOLMIUM LASER APPLICATION;  Surgeon: Kathie Rhodes, MD;  [facility];  Service: Urology;  Laterality: N/A;  . HOLMIUM LASER APPLICATION Right 4/74/2595   Procedure: HOLMIUM LASER APPLICATION;  Surgeon: Kathie Rhodes, MD;  Location: [facility];  Service: Urology;  Laterality: Right;  . HOLMIUM LASER APPLICATION Left 6/38/7564   Procedure: HOLMIUM LASER APPLICATION;  Surgeon: Kathie Rhodes, MD;  [facility];  Service: Urology;  Laterality: Left;  . IR FLUORO GUIDE PORT INSERTION RIGHT  02/24/2017  . IR NEPHROSTOMY PLACEMENT RIGHT  11/17/2017  . IR PATIENT EVAL TECH 0-60 MINS  03/11/2017  . IR  US GUIDE VASC ACCESS RIGHT  02/24/2017  . KNEE ARTHROSCOPY     bilateral  . LAPAROSCOPIC GASTRIC BANDING  07/10/2010  . LAPAROTOMY     for ruptured ovary and ovarian artery   . NEPHROLITHOTOMY Right 11/17/2017   Procedure: NEPHROLITHOTOMY PERCUTANEOUS;  Surgeon: Kathie Rhodes, MD;  [facility];  Service: Urology;  Laterality: Right;  . PACEMAKER INSERTION  03/10/2013   MDT dual chamber PPM  . POCKET REVISION N/A 12/08/2013   Procedure: POCKET REVISION;  Surgeon: Deboraha Sprang, MD;  Location: [facility] CATH LAB;  Service: Cardiovascular;  Laterality: N/A;  . PORTA CATH INSERTION    . TEE WITH CARDIOVERSION  09/22/2017  . TEE WITHOUT CARDIOVERSION N/A 10/03/2017   Procedure: TRANSESOPHAGEAL ECHOCARDIOGRAM (TEE);  Surgeon: Sanda Klein, MD;  Location: [place];  Service: Cardiovascular;  Laterality: N/A;  . THYROIDECTOMY  1998   Dr Margot Chimes  .  TONSILLECTOMY    . TOTAL KNEE ARTHROPLASTY  04/13/2012   Procedure: TOTAL KNEE ARTHROPLASTY;  Surgeon: Rudean Haskell, MD;  Location: [place];  Service: Orthopedics;  Laterality: Right;  Marland Kitchen VIDEO BRONCHOSCOPY WITH ENDOBRONCHIAL ULTRASOUND N/A 02/07/2017   Procedure: VIDEO BRONCHOSCOPY WITH ENDOBRONCHIAL ULTRASOUND;  Surgeon: Marshell Garfinkel, MD;  Location: [place];  Service: Pulmonary;  Laterality: N/A;    Current Outpatient Medications  Medication Sig Dispense Refill  . amiodarone (PACERONE) 200 MG tablet Take 1 tablet (200 mg total) by mouth 2 (two) times daily. 90 tablet 3  . cycloSPORINE (RESTASIS) 0.05 % ophthalmic emulsion Place 1 drop into both eyes 2 (two) times daily.    . diphenhydramine-acetaminophen (TYLENOL PM) 25-500 MG TABS tablet Take 2 tablets by mouth at bedtime.     Marland Kitchen ELIQUIS 5 MG TABS tablet TAKE 1 TABLET BY MOUTH TWO  TIMES DAILY 180 tablet 1  . furosemide (LASIX) 20 MG tablet Take 1 tablets daily till you see your cardiolgist (Patient taking differently: Take 20 mg by mouth daily as needed for fluid or edema. ) 30 tablet 0  .  levothyroxine (SYNTHROID, LEVOTHROID) 175 MCG tablet Take 175 mcg by mouth See admin instructions. TAKE 1 TABLET (175 MCG) BY MOUTH DAILY, EXCEPT FRIDAYS.    Marland Kitchen Lifitegrast (XIIDRA) 5 % SOLN Apply 1 drop to eye 2 (two) times daily.    . Multiple Vitamin (MULTIVITAMIN) tablet Take 1 tablet by mouth daily.     Marland Kitchen oxyCODONE-acetaminophen (PERCOCET/ROXICET) 5-325 MG tablet Take 1 tablet by mouth daily as needed for severe pain.     No current facility-administered medications for this encounter.     Allergies  Allergen Reactions  . Tikosyn [Dofetilide] Other (See Comments)    Prolonged QT interval Prolonged QT interval  . Xarelto [Rivaroxaban] Other (See Comments)    Nose Bleed X 6 hrs ; packing in ER  . Epinephrine Other (See Comments) and Rash    Dental form only (liquid). Patient stated she will become "out of it" she can hear you but cannot respond in normal fashion.  Marland Kitchen Ketamine     Hallucinations  . Oral Anesthetic [Benzocaine]   . Adhesive [Tape] Rash    Paper tape as well    Social History   Socioeconomic History  . Marital status: Single    Spouse name: Not on file  . Number of children: 0  . Years of education: Not on file  . Highest education level: Not on file  Occupational History  . 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    Employer: RETIRED  Social Needs  . Financial resource strain: Not on file  . Food insecurity:    Worry: Not on file    Inability: Not on file  . Transportation needs:    Medical: Not on file    Non-medical: Not on file  Tobacco Use  . Smoking status: Never Smoker  . Smokeless tobacco: Never Used  Substance and Sexual Activity  . Alcohol use: No  . Drug use: No  . Sexual activity: Never  Lifestyle  . Physical activity:    Days per week: Not on file    Minutes per session: Not on file  . Stress: Not on file  Relationships  . Social connections:    Talks on phone: Not on file    Gets together: Not on file    Attends  religious service: Not on file    Active member of club or organization: Not on file  Attends meetings of clubs or organizations: Not on file    Relationship status: Not on file  . Intimate partner violence:    Fear of current or ex partner: Not on file    Emotionally abused: Not on file    Physically abused: Not on file    Forced sexual activity: Not on file  Other Topics Concern  . Not on file  Social History Narrative   SINGLE    NO CHILDREN   NEVER SMOKED   EXERCISE 3 X WK          Family History  Problem Relation Age of Onset  . Heart disease Father 24       MI _0   . Colon cancer Father        COLON  . Heart attack Father   . Diabetes Sister   . Diabetes Brother   . Diabetes Paternal Aunt   . Diabetes Paternal Grandmother     ROS- All systems are reviewed and negative except as per the HPI above  Physical Exam: Vitals:   01/14/18 1104  BP: 106/74  Pulse: 75  Weight: 199 lb (90.3 kg)  Height: _1  (1.676 m)   Wt Readings from Last 3 Encounters:  01/14/18 199 lb (90.3 kg)  01/02/18 196 lb 12.8 oz (89.3 kg)  12/15/17 200 lb (90.7 kg)    Labs: Lab Results  Component Value Date   NA 138 01/02/2018   K 4.0 01/02/2018   CL 105 01/02/2018   CO2 24 01/02/2018   GLUCOSE 116 (H) 01/02/2018   BUN 17 01/02/2018   CREATININE 1.07 (H) 01/02/2018   CALCIUM 9.2 01/02/2018   PHOS 3.9 04/19/2013   MG 1.9 09/16/2017   Lab Results  Component Value Date   INR 1.01 11/17/2017   Lab Results  Component Value Date   CHOL 200 07/26/2013   HDL 51.80 07/26/2013   LDLCALC 116 (H) 07/26/2013   TRIG 162.0 (H) 07/26/2013     GEN- The patient is well appearing, alert and oriented x 3 today.   Head- normocephalic, atraumatic Eyes-  Sclera clear, conjunctiva pink Ears- hearing intact Oropharynx- clear Neck- supple, no JVP Lymph- no cervical lymphadenopathy Lungs- Clear to ausculation bilaterally, normal work of breathing Heart-  regular rate and rhythm,  no murmurs, rubs or gallops, PMI not laterally displaced GI- soft, NT, ND, + BS Extremities- no clubbing, cyanosis, or edema MS- no significant deformity or atrophy Skin- no rash or lesion Psych- euthymic mood, full affect Neuro- strength and sensation are intact  EKG - a paced at 75 bpm, pr int 212 ms, qrs int 88 ms, qtc 493 ms Epic records reviewed     Assessment and Plan: 1. Afib/flutter Successful cardioversion Continue amiodarone  at 200 mg bid for one week and then reduce to 200 mg daily Continue eliquis 5 mg bid for chadsvasc score of at least 4   F/u as scheduled with Dr. Norma Fredrickson C. [place], [facility] [address] [place], [place] [address] [phone]

## 2018-01-14 NOTE — Patient Instructions (Signed)
Take amiodarone 200 mg twice a day for the next week and then decrease dose to 200 mg once daily thereafter.    If you have any questions please call our clinic at the above number  Keep scheduled appt with Dr. Caryl Comes

## 2018-01-21 ENCOUNTER — Encounter: Payer: Self-pay | Admitting: Hematology and Oncology

## 2018-01-26 DIAGNOSIS — N200 Calculus of kidney: Secondary | ICD-10-CM | POA: Diagnosis not present

## 2018-01-26 DIAGNOSIS — C50911 Malignant neoplasm of unspecified site of right female breast: Secondary | ICD-10-CM | POA: Diagnosis not present

## 2018-01-26 DIAGNOSIS — J984 Other disorders of lung: Secondary | ICD-10-CM | POA: Diagnosis not present

## 2018-01-26 DIAGNOSIS — E7849 Other hyperlipidemia: Secondary | ICD-10-CM | POA: Diagnosis not present

## 2018-01-26 DIAGNOSIS — C884 Extranodal marginal zone B-cell lymphoma of mucosa-associated lymphoid tissue [MALT-lymphoma]: Secondary | ICD-10-CM | POA: Diagnosis not present

## 2018-01-26 DIAGNOSIS — Z1389 Encounter for screening for other disorder: Secondary | ICD-10-CM | POA: Diagnosis not present

## 2018-01-26 DIAGNOSIS — Z6831 Body mass index (BMI) 31.0-31.9, adult: Secondary | ICD-10-CM | POA: Diagnosis not present

## 2018-01-26 DIAGNOSIS — E890 Postprocedural hypothyroidism: Secondary | ICD-10-CM | POA: Diagnosis not present

## 2018-01-26 DIAGNOSIS — C73 Malignant neoplasm of thyroid gland: Secondary | ICD-10-CM | POA: Diagnosis not present

## 2018-01-26 DIAGNOSIS — R7301 Impaired fasting glucose: Secondary | ICD-10-CM | POA: Diagnosis not present

## 2018-01-26 DIAGNOSIS — D508 Other iron deficiency anemias: Secondary | ICD-10-CM | POA: Diagnosis not present

## 2018-01-26 DIAGNOSIS — I480 Paroxysmal atrial fibrillation: Secondary | ICD-10-CM | POA: Diagnosis not present

## 2018-02-02 ENCOUNTER — Other Ambulatory Visit: Payer: Medicare Other

## 2018-02-03 DIAGNOSIS — H04123 Dry eye syndrome of bilateral lacrimal glands: Secondary | ICD-10-CM | POA: Diagnosis not present

## 2018-02-04 ENCOUNTER — Ambulatory Visit: Payer: Medicare Other | Admitting: Hematology and Oncology

## 2018-02-04 ENCOUNTER — Other Ambulatory Visit: Payer: Self-pay | Admitting: Orthopedic Surgery

## 2018-02-09 DIAGNOSIS — H26492 Other secondary cataract, left eye: Secondary | ICD-10-CM | POA: Diagnosis not present

## 2018-02-09 DIAGNOSIS — H26491 Other secondary cataract, right eye: Secondary | ICD-10-CM | POA: Diagnosis not present

## 2018-02-12 DIAGNOSIS — Z4651 Encounter for fitting and adjustment of gastric lap band: Secondary | ICD-10-CM | POA: Diagnosis not present

## 2018-02-17 ENCOUNTER — Inpatient Hospital Stay: Payer: Medicare Other | Attending: Hematology and Oncology

## 2018-02-17 ENCOUNTER — Ambulatory Visit (HOSPITAL_COMMUNITY)
Admission: RE | Admit: 2018-02-17 | Discharge: 2018-02-17 | Disposition: A | Discharge status: Home / Self Care | Payer: Medicare Other | Source: Ambulatory Visit | Attending: Hematology and Oncology | Admitting: Hematology and Oncology

## 2018-02-17 DIAGNOSIS — Y842 Radiological procedure and radiotherapy as the cause of abnormal reaction of the patient, or of later complication, without mention of misadventure at the time of the procedure: Secondary | ICD-10-CM | POA: Diagnosis not present

## 2018-02-17 DIAGNOSIS — I700 Atherosclerosis of aorta: Secondary | ICD-10-CM | POA: Diagnosis not present

## 2018-02-17 DIAGNOSIS — I4891 Unspecified atrial fibrillation: Secondary | ICD-10-CM | POA: Insufficient documentation

## 2018-02-17 DIAGNOSIS — J90 Pleural effusion, not elsewhere classified: Secondary | ICD-10-CM | POA: Diagnosis not present

## 2018-02-17 DIAGNOSIS — R918 Other nonspecific abnormal finding of lung field: Secondary | ICD-10-CM | POA: Insufficient documentation

## 2018-02-17 DIAGNOSIS — C8522 Mediastinal (thymic) large B-cell lymphoma, intrathoracic lymph nodes: Secondary | ICD-10-CM | POA: Insufficient documentation

## 2018-02-17 DIAGNOSIS — Z7901 Long term (current) use of anticoagulants: Secondary | ICD-10-CM | POA: Insufficient documentation

## 2018-02-17 DIAGNOSIS — Z79899 Other long term (current) drug therapy: Secondary | ICD-10-CM | POA: Diagnosis not present

## 2018-02-17 DIAGNOSIS — R05 Cough: Secondary | ICD-10-CM | POA: Insufficient documentation

## 2018-02-17 DIAGNOSIS — J700 Acute pulmonary manifestations due to radiation: Secondary | ICD-10-CM | POA: Diagnosis not present

## 2018-02-17 DIAGNOSIS — R1084 Generalized abdominal pain: Secondary | ICD-10-CM | POA: Insufficient documentation

## 2018-02-17 DIAGNOSIS — J181 Lobar pneumonia, unspecified organism: Secondary | ICD-10-CM | POA: Diagnosis not present

## 2018-02-17 DIAGNOSIS — Z9221 Personal history of antineoplastic chemotherapy: Secondary | ICD-10-CM | POA: Insufficient documentation

## 2018-02-17 DIAGNOSIS — R590 Localized enlarged lymph nodes: Secondary | ICD-10-CM | POA: Diagnosis not present

## 2018-02-17 DIAGNOSIS — R531 Weakness: Secondary | ICD-10-CM | POA: Insufficient documentation

## 2018-02-17 LAB — CBC WITH DIFFERENTIAL (CANCER CENTER ONLY)
BASOS PCT: 1 %
Basophils Absolute: 0 10*3/uL (ref 0.0–0.1)
EOS ABS: 0.1 10*3/uL (ref 0.0–0.5)
EOS PCT: 3 %
HCT: 37.4 % (ref 34.8–46.6)
HEMOGLOBIN: 11.9 g/dL (ref 11.6–15.9)
Lymphocytes Relative: 13 %
Lymphs Abs: 0.6 10*3/uL — ABNORMAL LOW (ref 0.9–3.3)
MCH: 29.9 pg (ref 25.1–34.0)
MCHC: 31.8 g/dL (ref 31.5–36.0)
MCV: 94 fL (ref 79.5–101.0)
Monocytes Absolute: 0.5 10*3/uL (ref 0.1–0.9)
Monocytes Relative: 11 %
NEUTROS PCT: 72 %
Neutro Abs: 3.1 10*3/uL (ref 1.5–6.5)
PLATELETS: 152 10*3/uL (ref 145–400)
RBC: 3.98 MIL/uL (ref 3.70–5.45)
RDW: 15.4 % — ABNORMAL HIGH (ref 11.2–14.5)
WBC: 4.3 10*3/uL (ref 3.9–10.3)

## 2018-02-17 LAB — CMP (CANCER CENTER ONLY)
ALK PHOS: 81 U/L (ref 40–150)
ALT: 18 U/L (ref 0–55)
AST: 19 U/L (ref 5–34)
Albumin: 3.8 g/dL (ref 3.5–5.0)
Anion gap: 7 (ref 3–11)
BILIRUBIN TOTAL: 0.5 mg/dL (ref 0.2–1.2)
BUN: 23 mg/dL (ref 7–26)
CALCIUM: 9.5 mg/dL (ref 8.4–10.4)
CO2: 25 mmol/L (ref 22–29)
CREATININE: 0.9 mg/dL (ref 0.60–1.10)
Chloride: 107 mmol/L (ref 98–109)
Glucose, Bld: 98 mg/dL (ref 70–140)
Potassium: 4.4 mmol/L (ref 3.5–5.1)
Sodium: 139 mmol/L (ref 136–145)
Total Protein: 6.8 g/dL (ref 6.4–8.3)

## 2018-02-17 LAB — LACTATE DEHYDROGENASE: LDH: 237 U/L (ref 125–245)

## 2018-02-17 MED ORDER — HEPARIN SOD (PORK) LOCK FLUSH 100 UNIT/ML IV SOLN: 500.0000 [IU] | Freq: Once | INTRAVENOUS | Status: DC

## 2018-02-17 MED ORDER — IOPAMIDOL (ISOVUE-300) INJECTION 61%
INTRAVENOUS | Status: AC
  Filled 2018-02-17: qty 100

## 2018-02-17 MED ORDER — HEPARIN SOD (PORK) LOCK FLUSH 100 UNIT/ML IV SOLN
INTRAVENOUS | Status: AC
  Administered 2018-02-17: 500 [IU]
  Filled 2018-02-17: qty 5

## 2018-02-17 MED ORDER — IOPAMIDOL (ISOVUE-300) INJECTION 61%
75.0000 mL | Freq: Once | INTRAVENOUS | Status: AC | PRN
  Administered 2018-02-17: 75 mL via INTRAVENOUS

## 2018-02-19 ENCOUNTER — Telehealth: Payer: Self-pay | Admitting: Hematology and Oncology

## 2018-02-19 ENCOUNTER — Inpatient Hospital Stay: Payer: Medicare Other

## 2018-02-19 ENCOUNTER — Inpatient Hospital Stay (HOSPITAL_BASED_OUTPATIENT_CLINIC_OR_DEPARTMENT_OTHER): Payer: Medicare Other | Admitting: Hematology and Oncology

## 2018-02-19 VITALS — BP 146/73 | HR 77 | Temp 97.8°F | Resp 16 | Ht 66.0 in | Wt 199.3 lb

## 2018-02-19 DIAGNOSIS — J700 Acute pulmonary manifestations due to radiation: Secondary | ICD-10-CM | POA: Diagnosis not present

## 2018-02-19 DIAGNOSIS — R531 Weakness: Secondary | ICD-10-CM | POA: Diagnosis not present

## 2018-02-19 DIAGNOSIS — I4891 Unspecified atrial fibrillation: Secondary | ICD-10-CM | POA: Diagnosis not present

## 2018-02-19 DIAGNOSIS — C8522 Mediastinal (thymic) large B-cell lymphoma, intrathoracic lymph nodes: Secondary | ICD-10-CM

## 2018-02-19 DIAGNOSIS — R1084 Generalized abdominal pain: Secondary | ICD-10-CM | POA: Diagnosis not present

## 2018-02-19 DIAGNOSIS — R05 Cough: Secondary | ICD-10-CM | POA: Diagnosis not present

## 2018-02-19 NOTE — Telephone Encounter (Signed)
Gave patient AVs and calendar of upcoming November appointments.  °

## 2018-02-19 NOTE — Assessment & Plan Note (Signed)
02/03/2017: Large superior mediastinal mass contiguous with right hilar region encasing the trachea and right mainstem bronchus measuring 5.4 x 7.5 x 6 cm causing SVC obstruction, additional mediastinal lymph nodes subcarinal 1.6 cm and left hilar 1 cm; multiple small pulmonary nodules 4-6 mm   Bronchoscopy and biopsy 02/09/2017: Monoclonal B-cell population expressing CD10 consistent with non-Hodgkin's B-cell lymphoma germinal center origin Bone marrow biopsy 02/21/2017: No evidence of lymphoma PET/CT scan 02/19/2017: Hypermetabolic right paratracheal nodal mass  R CHOP x6 cycles completed 07/16/2017 ---------------------------------------------------------------------- Atrial flutter: Cardioversion by Dr. Nahser 07/18/2017 PET/CT scan 08/27/2017:Hypermetabolic right paratracheal nodal mass February 2019: Renal stone status post nephrostomy  CT chest abdomen pelvis 02/17/2018: New patchy consolidation right lower lobe infection/inflammation, postradiation changes in the mediastinum.  No evidence of lymphoma recurrence  Return to clinic in 6 months with labs and follow-up 

## 2018-02-19 NOTE — Progress Notes (Signed)
Patient Care Team: [name], MD as PCP - General (Endocrinology) Deboraha Sprang, MD as PCP - Cardiology (Cardiology)  DIAGNOSIS:  Encounter Diagnoses  Name Primary?  . 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 Generalized abdominal pain Yes    SUMMARY OF ONCOLOGIC HISTORY:   Non-Hodgkin lymphoma, unspecified, intrathoracic lymph nodes ([place])   02/03/2017 Imaging    Large superior mediastinal mass contiguous with right hilar region encasing the trachea and right mainstem bronchus measuring 5.4 x 7.5 x 6 cm causing SVC obstruction, additional mediastinal lymph nodes subcarinal 1.6 cm and left hilar 1 cm; multiple small pulmonary nodules 4-6 mm       02/11/2017 Initial Diagnosis    Flow cytometry of mediastinal mass revealed monoclonal population of B cells with expression of CD10 consistent with non-Hodgkin B-cell lymphoma germinal center operation; bone marrow biopsy negative for lymphoma      04/02/2017 - 07/16/2017 Chemotherapy    R-CHOP X 6 cycles        CHIEF COMPLIANT: Follow-up after recent scans  INTERVAL HISTORY: Kayla Harrison is a 72 year old with above-mentioned history of large mediastinal mass biopsy-proven to be non-Hodgkin's lymphoma who received 6 cycles of R-CHOP chemotherapy and had a remarkable response to treatment.  She is here for annual follow-up with a CT scan of the chest abdomen pelvis performed on 02/17/2018.  She denies any B symptoms like fevers chills night sweats or weight loss.  She has been complaining of dizziness and unsteadiness of gait.  This is not been getting any better.  She has been able to walk with the help of a cane.  She however does not want me to refer her to neurology.  She is also been fatigued and generally weak.  She went to physical therapy but it has not completely improved her energy levels.  She has intermittent dry cough related to radiation.  She denies any fevers or  chills.   REVIEW OF SYSTEMS:   Constitutional: Denies fevers, chills or abnormal weight loss Eyes: Denies blurriness of vision Ears, nose, mouth, throat, and face: Denies mucositis or sore throat Respiratory: Intermittent dry cough Cardiovascular: Denies palpitation, chest discomfort Gastrointestinal:  Denies nausea, heartburn or change in bowel habits Skin: Denies abnormal skin rashes Lymphatics: Denies new lymphadenopathy or easy bruising Neurological:Denies numbness, tingling or new weaknesses Behavioral/Psych: Mood is stable, no new changes  Extremities: No lower extremity edema Breast:  denies any pain or lumps or nodules in either breasts All other systems were reviewed with the patient and are negative.  I have reviewed the past medical history, past surgical history, social history and family history with the patient and they are unchanged from previous note.  ALLERGIES:  is allergic to tikosyn [dofetilide]; xarelto [rivaroxaban]; epinephrine; ketamine; oral anesthetic [benzocaine]; and adhesive [tape].  MEDICATIONS:  Current Outpatient Medications  Medication Sig Dispense Refill  . amiodarone (PACERONE) 200 MG tablet Take 1 tablet (200 mg total) by mouth 2 (two) times daily. 90 tablet 3  . cycloSPORINE (RESTASIS) 0.05 % ophthalmic emulsion Place 1 drop into both eyes 2 (two) times daily.    . diphenhydramine-acetaminophen (TYLENOL PM) 25-500 MG TABS tablet Take 2 tablets by mouth at bedtime.     Marland Kitchen ELIQUIS 5 MG TABS tablet TAKE 1 TABLET BY MOUTH TWO  TIMES DAILY 180 tablet 1  . furosemide (LASIX) 20 MG tablet Take 1 tablets daily till you see your cardiolgist (Patient taking differently: Take 20 mg by  mouth daily as needed for fluid or edema. ) 30 tablet 0  . levothyroxine (SYNTHROID, LEVOTHROID) 175 MCG tablet Take 175 mcg by mouth See admin instructions. TAKE 1 TABLET (175 MCG) BY MOUTH DAILY, EXCEPT FRIDAYS.    Marland Kitchen Lifitegrast (XIIDRA) 5 % SOLN Apply 1 drop to eye 2 (two)  times daily.    . Multiple Vitamin (MULTIVITAMIN) tablet Take 1 tablet by mouth daily.     Marland Kitchen oxyCODONE-acetaminophen (PERCOCET/ROXICET) 5-325 MG tablet Take 1 tablet by mouth daily as needed for severe pain.     No current facility-administered medications for this visit.     PHYSICAL EXAMINATION: ECOG PERFORMANCE STATUS: 1 - Symptomatic but completely ambulatory  Vitals:   02/19/18 1155  BP: (!) 146/73  Pulse: 77  Resp: 16  Temp: 97.8 F (36.6 C)  SpO2: 96%   Filed Weights   02/19/18 1155  Weight: 199 lb 4.8 oz (90.4 kg)    GENERAL:alert, no distress and comfortable SKIN: skin color, texture, turgor are normal, no rashes or significant lesions EYES: normal, Conjunctiva are pink and non-injected, sclera clear OROPHARYNX:no exudate, no erythema and lips, buccal mucosa, and tongue normal  NECK: supple, thyroid normal size, non-tender, without nodularity LYMPH:  no palpable lymphadenopathy in the cervical, axillary or inguinal LUNGS: clear to auscultation and percussion with normal breathing effort HEART: regular rate & rhythm and no murmurs and no lower extremity edema ABDOMEN:abdomen soft, non-tender and normal bowel sounds MUSCULOSKELETAL:no cyanosis of digits and no clubbing  NEURO: alert & oriented x 3 with fluent speech, no focal motor/sensory deficits EXTREMITIES: No lower extremity edema  LABORATORY DATA:  I have reviewed the data as listed CMP Latest Ref Rng & Units 02/17/2018 01/02/2018 11/27/2017  Glucose 70 - 140 mg/dL 98 116(H) 105  BUN 7 - 26 mg/dL _0 Creatinine 0.60 - 1.10 mg/dL 0.90 1.07(H) 0.98  Sodium 136 - 145 mmol/L 139 138 140  Potassium 3.5 - 5.1 mmol/L 4.4 4.0 4.6  Chloride 98 - 109 mmol/L 107 105 106  CO2 22 - 29 mmol/L _1 Calcium 8.4 - 10.4 mg/dL 9.5 9.2 9.5  Total Protein 6.4 - 8.3 g/dL 6.8 - 6.5  Total Bilirubin 0.2 - 1.2 mg/dL 0.5 - 0.5  Alkaline Phos 40 - 150 U/L 81 - 80  AST 5 - 34 U/L 19 - 25  ALT 0 - 55 U/L 18 - 23    Lab  Results  Component Value Date   WBC 4.3 02/17/2018   HGB 11.9 02/17/2018   HCT 37.4 02/17/2018   MCV 94.0 02/17/2018   PLT 152 02/17/2018   NEUTROABS 3.1 02/17/2018    ASSESSMENT & PLAN:  Non-Hodgkin lymphoma, unspecified, intrathoracic lymph nodes ([facility]) 02/03/2017: Large superior mediastinal mass contiguous with right hilar region encasing the trachea and right mainstem bronchus measuring 5.4 x 7.5 x 6 cm causing SVC obstruction, additional mediastinal lymph nodes subcarinal 1.6 cm and left hilar 1 cm; multiple small pulmonary nodules 4-6 mm   Bronchoscopy and biopsy 02/09/2017: Monoclonal B-cell population expressing CD10 consistent with non-Hodgkin's B-cell lymphoma germinal center origin Bone marrow biopsy 02/21/2017: No evidence of lymphoma PET/CT scan 29/56/2130: Hypermetabolic right paratracheal nodal mass  R CHOP x6 cycles completed 07/16/2017 ---------------------------------------------------------------------- Atrial flutter: Cardioversion by Dr. Acie Fredrickson 07/18/2017 PET/CT scan 08/27/2017:Hypermetabolic right paratracheal nodal mass February 2019: Renal stone status post nephrostomy  CT chest abdomen pelvis 02/17/2018: New patchy consolidation right lower lobe infection/inflammation, postradiation changes in the mediastinum.  No evidence of  lymphoma recurrence  Lung infiltrates: I sent a message to Dr. Halford Chessman to look at the CT scan and give recommendations. Radiation pneumonitis: Appears to be stable to improved. Return to clinic in 6 months with labs and scans and follow-up    Orders Placed This Encounter  Procedures  . CT Chest W Contrast    Standing Status:   Future    Standing Expiration Date:   02/19/2019    Order Specific Question:   If indicated for the ordered procedure, I authorize the administration of contrast media per Radiology protocol    Answer:   Yes    Order Specific Question:   Preferred imaging location?    Answer:   [facility]    Order  Specific Question:   Radiology Contrast Protocol - do NOT remove file path    Answer:   [email or web address]    Order Specific Question:   Reason for Exam additional comments    Answer:   Lymphoma lung infiltrates  . CT Abdomen Pelvis W Contrast    Standing Status:   Future    Standing Expiration Date:   02/19/2019    Order Specific Question:   If indicated for the ordered procedure, I authorize the administration of contrast media per Radiology protocol    Answer:   Yes    Order Specific Question:   Preferred imaging location?    Answer:   [facility]    Order Specific Question:   Is Oral Contrast requested for this exam?    Answer:   Yes, Per Radiology protocol    Order Specific Question:   Radiology Contrast Protocol - do NOT remove file path    Answer:   [email or web address]    Order Specific Question:   Reason for Exam additional comments    Answer:   Lymphoma restaging  . CBC with Differential (Cancer Center Only)    Standing Status:   Future    Standing Expiration Date:   02/20/2019  . Lactate dehydrogenase (LDH)    Standing Status:   Future    Standing Expiration Date:   02/19/2019  . CMP ([place] only)    Standing Status:   Future    Standing Expiration Date:   02/20/2019   The patient has a good understanding of the overall plan. she agrees with it. she will call with any problems that may develop before the next visit here.   Harriette Ohara, MD 02/19/18

## 2018-02-24 ENCOUNTER — Ambulatory Visit (INDEPENDENT_AMBULATORY_CARE_PROVIDER_SITE_OTHER): Payer: Medicare Other | Admitting: Pulmonary Disease

## 2018-02-24 ENCOUNTER — Other Ambulatory Visit: Payer: Self-pay | Admitting: Internal Medicine

## 2018-02-24 ENCOUNTER — Ambulatory Visit (INDEPENDENT_AMBULATORY_CARE_PROVIDER_SITE_OTHER)
Admission: RE | Admit: 2018-02-24 | Discharge: 2018-02-24 | Disposition: A | Discharge status: Home / Self Care | Payer: Medicare Other | Source: Ambulatory Visit | Attending: Pulmonary Disease | Admitting: Pulmonary Disease

## 2018-02-24 ENCOUNTER — Encounter: Payer: Self-pay | Admitting: Pulmonary Disease

## 2018-02-24 VITALS — BP 98/70 | HR 83 | Ht 66.0 in | Wt 199.6 lb

## 2018-02-24 DIAGNOSIS — R918 Other nonspecific abnormal finding of lung field: Secondary | ICD-10-CM

## 2018-02-24 DIAGNOSIS — J439 Emphysema, unspecified: Secondary | ICD-10-CM | POA: Diagnosis not present

## 2018-02-24 MED ORDER — AMOXICILLIN-POT CLAVULANATE 875-125 MG PO TABS: 1.0000 | ORAL_TABLET | Freq: Two times a day (BID) | ORAL | 0 refills | Status: DC

## 2018-02-24 NOTE — Progress Notes (Signed)
[facility]  Chief Complaint  Patient presents with  . Follow-up    Pt is here for CT results. Pt is unsteady walking, and has very dry cough.    Vital signs: BP 98/70 (BP Location: Left Arm, Cuff Size: Normal)   Pulse 83   Ht '5\' 6"'$  (1.676 m)   Wt 199 lb 9.6 oz (90.5 kg)   SpO2 97%   BMI 32.22 kg/m   History of Present Illness: Kayla Harrison is a 72 y.o. female with dyspnea, dry cough.  She has hx of non Hodgkin's lymphoma s/p R-CHOP from 04/02/17 to 07/16/17.  I saw her in January 2019 for assessment of dyspnea with recurrent right effusion.  CXR at that time showed resolution of effusion.  She was seen by Dr. Lindi Harrison recently for f/u of NHL.  She had f/u CT chest that showed stable 6 mm LLL nodule, patchy nodular consolidation RLL, stable 3 mm RUL nodule, stable Rt paramediastinal post radiation changes.  Since her last visit she had treatment for renal stones.  She also had dental extraction about 6 weeks ago.    She has a dry cough.  She is not having fever, sweats, chills, weight loss, hemoptysis, wheeze, chest pain, nausea, vomiting, abdominal pain.  She still feels low energy from previous chemotherapy, but slowly improving.  She has chronic right shoulder pain and will need to have surgery for this at some point.   Physical Exam:  General - pleasant Eyes - pupils reactive ENT - no sinus tenderness, no oral exudate, no LAN, healing dental socket right upper jaw w/o drainage Cardiac - regular, no murmur Chest - no wheeze, rales Abd - soft, non tender Ext - no edema Skin - no rashes Neuro - normal strength Psych - normal mood   Discussion: She has nodular area of consolidation right lower lung.  She had recent dental procedure with dental extraction for recurrent cavity.  She has persistent cough.  It is possible she could have aspirated during procedure.  Her previous right pleural effusion has resolved.  This could have  been related to diastolic CHF with history of A fib/flutter.  She has expected changes from prior history of radiation therapy for NHL, and stable tiny lung nodules.  Assessment/Plan:  Nodular consolidation RLL. - will give course of augmetin - chest xray today - if findings persist, then will need bronchoscopy  NHL. - f/u with Dr. Lindi Harrison  Radiation fibrosis RUL in setting no B cell lymphoma. - monitor on f/u imaging studies - don't think she needs any intervention for this at present   Patient Instructions  Chest xray today  Augmentin 1 pill twice daily for 10 days  Follow up in 2 to 3 weeks with Dr. Halford Harrison or Nurse Practitioner    Kayla Mires, MD Kayla Harrison 02/24/2018, 11:56 AM Pager:  [phone]  Flow Sheet  Pulmonary tests: PFT 01/20/13 >> FEV1 2.97 (118%), FEV1% 86, TLC 5.91 (110%), DLCO 82% CT angio chest 09/15/17 >> b/l pleural effusions, radiation fibrosis RUL CT chest 528/19 >>  stable 6 mm LLL nodule, patchy nodular consolidation RLL, stable 3 mm RUL nodule, stable Rt paramediastinal post radiation changes.  Cardiac tests: Echo 09/17/17 >> EF 55 to 60%, mild MR, severe LA dilation, PAS 44 mmHg  Past Medical History: She  has a past medical history of Anemia, Arthritis, Atrial fibrillation ([place]), Atrial fibrillation ([place]), Blood transfusion without reported diagnosis, Breast cancer ([place]), Chronic bilateral pleural  effusions, Colon polyp, Complete heart block ([place]), Complication of anesthesia, Dyslipidemia, Dyspnea, H/O pleural effusion, Hepatitis, History of dysphagia, History of hiatal hernia, History of kidney stones, thyroid cancer, Hyperlipidemia, Hypothyroidism, Lung cancer, lower lobe ([place]) (01/2017), Morbid obesity ([place]), Non Hodgkin's lymphoma ([place]), Persistent atrial fibrillation ([place]), Presence of permanent cardiac pacemaker, Sinus node dysfunction ([place]), Stroke ([place]), and SVC syndrome.  Past Surgical History: She  has a past surgical  history that includes Thyroidectomy (1998); bso (1998); Cholecystectomy; Tonsillectomy; Colonoscopy w/ polypectomy; afib ablation; Abdominal hysterectomy (1983); Breast lumpectomy (2010); Laparoscopic gastric banding (07/10/2010); Appendectomy; Knee arthroscopy; laparotomy; Total knee arthroplasty (04/13/2012); Cardioversion (10/09/2012); Cardioversion (10/09/2012); Cardioversion (N/A, 11/20/2012); Pacemaker insertion (03/10/2013); pocket revision (N/A, 12/08/2013); Eye surgery; Cystoscopy with retrograde pyelogram, ureteroscopy and stent placement (Right, 02/06/2015); Holmium laser application (N/A, 8/36/6294); Cystoscopy (N/A, 02/06/2015); Video bronchoscopy with endobronchial ultrasound (N/A, 02/07/2017); IR FLUORO GUIDE PORT INSERTION RIGHT (02/24/2017); IR US Guide Vasc Access Right (02/24/2017); Bone marrow biopsy (02/21/2017); Cystoscopy with retrograde pyelogram, ureteroscopy and stent placement (Right, 03/07/2017); Holmium laser application (Right, 7/65/4650); IR PATIENT EVAL TECH 0-60 MINS (03/11/2017); Cardioversion (N/A, 07/18/2017); TEE with cardioversion (09/22/2017); PORTA CATH INSERTION; TEE without cardioversion (N/A, 10/03/2017); Cardioversion (N/A, 10/03/2017); IR NEPHROSTOMY PLACEMENT RIGHT (11/17/2017); Nephrolithotomy (Right, 11/17/2017); Cystoscopy w/ retrogrades (Left, 11/17/2017); Holmium laser application (Left, 3/54/6568); and Cardioversion (N/A, 01/07/2018).  Family History: Her family history includes Colon cancer in her father; Diabetes in her brother, paternal aunt, paternal grandmother, and sister; Heart attack in her father; Heart disease (age of onset: 51) in her father.  Social History: She  reports that she has never smoked. She has never used smokeless tobacco. She reports that she does not drink alcohol or use drugs.  Medications: Allergies as of 02/24/2018      Reactions   Tikosyn [dofetilide] Other (See Comments)   Prolonged QT interval Prolonged QT interval   Xarelto [rivaroxaban]  Other (See Comments)   Nose Bleed X 6 hrs ; packing in ER   Epinephrine Other (See Comments), Rash   Dental form only (liquid). Patient stated she will become "out of it" she can hear you but cannot respond in normal fashion.   Ketamine    Hallucinations   Oral Anesthetic [benzocaine]    Adhesive [tape] Rash   Paper tape as well      Medication List        Accurate as of 02/24/18 11:56 AM. Always use your most recent med list.          amiodarone 200 MG tablet Commonly known as:  PACERONE Take 1 tablet (200 mg total) by mouth 2 (two) times daily.   amoxicillin-clavulanate 875-125 MG tablet Commonly known as:  AUGMENTIN Take 1 tablet by mouth 2 (two) times daily.   cycloSPORINE 0.05 % ophthalmic emulsion Commonly known as:  RESTASIS Place 1 drop into both eyes 2 (two) times daily.   diphenhydramine-acetaminophen 25-500 MG Tabs tablet Commonly known as:  TYLENOL PM Take 2 tablets by mouth at bedtime.   ELIQUIS 5 MG Tabs tablet Generic drug:  apixaban TAKE 1 TABLET BY MOUTH TWO  TIMES DAILY   furosemide 20 MG tablet Commonly known as:  LASIX Take 1 tablets daily till you see your cardiolgist   levothyroxine 175 MCG tablet Commonly known as:  SYNTHROID, LEVOTHROID Take 175 mcg by mouth See admin instructions. TAKE 1 TABLET (175 MCG) BY MOUTH DAILY, EXCEPT FRIDAYS.   multivitamin tablet Take 1 tablet by mouth daily.   oxyCODONE-acetaminophen 5-325 MG tablet Commonly known as:  PERCOCET/ROXICET Take 1 tablet by mouth daily as needed for severe pain.   rosuvastatin 10 MG tablet Commonly known as:  CRESTOR Take 10 mg by mouth daily.   XIIDRA 5 % Soln Generic drug:  Lifitegrast Apply 1 drop to eye 2 (two) times daily.

## 2018-02-24 NOTE — Patient Instructions (Signed)
Chest xray today  Augmentin 1 pill twice daily for 10 days  Follow up in 2 to 3 weeks with Dr. Halford Chessman or Nurse Practitioner

## 2018-02-24 NOTE — Telephone Encounter (Signed)
Pt last saw [name] on 01/14/18, last labs 02/17/18 Creat 0.9, age 72, weight 90.5kg, based on specified criteria pt is on appropriate dosage of Eliquis 5mg  BID.  Will refill rx.

## 2018-03-05 ENCOUNTER — Telehealth: Payer: Self-pay | Admitting: Pulmonary Disease

## 2018-03-05 ENCOUNTER — Inpatient Hospital Stay: Admit: 2018-03-05 | Payer: Medicare Other | Admitting: Orthopedic Surgery

## 2018-03-05 SURGERY — ARTHROPLASTY, SHOULDER, TOTAL
Anesthesia: Choice | Site: Shoulder | Laterality: Right

## 2018-03-05 NOTE — Telephone Encounter (Signed)
Dg Chest 2 View  Result Date: 02/24/2018 CLINICAL DATA:  One year, atrial fibrillation, history lung cancer, non-Hodgkin's lymphoma EXAM: CHEST - 2 VIEW COMPARISON:  10/06/2017 Correlation: Interval CT chest 02/17/2018 FINDINGS: RIGHT jugular Port-A-Cath with tip projecting over SVC. LEFT subclavian sequential pacemaker with leads projecting at RIGHT atrium and RIGHT ventricle. Prior LEFT atrial appendage clipping. Normal heart size, mediastinal contours, and pulmonary vascularity. Emphysematous and bronchitic changes. Radiation fibrosis changes in the anteromedial RIGHT upper lobe again identified. No acute infiltrate, pleural effusion or pneumothorax. Bones unremarkable. Surgical clips and the inferior cervical region bilaterally, by history thyroidectomy 1998. IMPRESSION: Emphysematous changes with post radiation fibrosis changes at medial RIGHT upper lobe. No acute abnormalities. Electronically Signed   By: Lavonia Dana M.D.   On: 02/24/2018 10:57     Please let her know that CXR showed changes from radiation therapy and emphysema.  Will review in more detail at f/u on 03/13/18.

## 2018-03-05 NOTE — Telephone Encounter (Signed)
Attempted to call patient today regarding results. I did not receive an answer at time of call. I have left a voicemail message for pt to return call. X1  

## 2018-03-06 NOTE — Telephone Encounter (Signed)
Attempted to call patient today regarding results. I did not receive an answer at time of call. I have left a voicemail message for pt to return call. X2  

## 2018-03-09 NOTE — Telephone Encounter (Signed)
Attempted to call patient today regarding results. I did not receive an answer at time of call. I have left a voicemail message for pt to return call. X3 Mailed letter in mail today for pt to contact office for results. Nothing further needed at this time.

## 2018-03-10 ENCOUNTER — Telehealth: Payer: Self-pay | Admitting: Pulmonary Disease

## 2018-03-10 NOTE — Telephone Encounter (Signed)
Called and spoke with pt stating Kayla Harrison had tried to call her to let her know the results of the cxr.  Pt stated she saw the results on mychart.  Nothing further needed.

## 2018-03-13 ENCOUNTER — Ambulatory Visit (INDEPENDENT_AMBULATORY_CARE_PROVIDER_SITE_OTHER): Payer: Medicare Other | Admitting: Pulmonary Disease

## 2018-03-13 ENCOUNTER — Encounter: Payer: Self-pay | Admitting: Pulmonary Disease

## 2018-03-13 VITALS — BP 108/72 | HR 80 | Ht 66.0 in | Wt 197.0 lb

## 2018-03-13 DIAGNOSIS — R911 Solitary pulmonary nodule: Secondary | ICD-10-CM | POA: Diagnosis not present

## 2018-03-13 DIAGNOSIS — R918 Other nonspecific abnormal finding of lung field: Secondary | ICD-10-CM | POA: Diagnosis not present

## 2018-03-13 NOTE — Progress Notes (Signed)
[facility]  Chief Complaint  Patient presents with  . Follow-up    Pt is doing better since last OV.     Vital signs: BP 108/72 (BP Location: Left Arm, Cuff Size: Normal)   Pulse 80   Ht 5' 6" (1.676 m)   Wt 197 lb (89.4 kg)   SpO2 95%   BMI 31.80 kg/m   History of Present Illness: Kayla Harrison is a 72 y.o. female with dyspnea, dry cough.  She has hx of non Hodgkin's lymphoma s/p R-CHOP from 04/02/17 to 07/16/17.  Feeling better.  Not having as much cough.  No sputum, fever, wheeze, chest pain.  Keeping up with activity.   Physical Exam:  General - pleasant Eyes - pupils reactive ENT - no sinus tenderness, no oral exudate, no LAN Cardiac - regular, no murmur Chest - no wheeze, rales Abd - soft, non tender Ext - no edema Skin - no rashes Neuro - normal strength Psych - normal mood  Discussion: Likely had aspiration pneumonia after recent dental procedure.  Chest xray looked better.  Clinically improved.  Assessment/Plan:  Nodular consolidation RLL. - likely from aspiration - f/u CT chest w/o contrast August 2019  NHL. - f/u with Dr. Lindi Adie  Radiation fibrosis RUL in setting no B cell lymphoma. - monitor on f/u imaging studies - don't think she needs any intervention for this at present   Patient Instructions  Follow up in August 2019 after getting CT chest   Chesley Mires, MD [place] 03/13/2018, 2:33 PM Pager:  [phone]  Flow Sheet  Pulmonary tests: PFT 01/20/13 >> FEV1 2.97 (118%), FEV1% 86, TLC 5.91 (110%), DLCO 82% CT angio chest 09/15/17 >> b/l pleural effusions, radiation fibrosis RUL CT chest 528/19 >>  stable 6 mm LLL nodule, patchy nodular consolidation RLL, stable 3 mm RUL nodule, stable Rt paramediastinal post radiation changes.  Cardiac tests: Echo 09/17/17 >> EF 55 to 60%, mild MR, severe LA dilation, PAS 44 mmHg  Past Medical History: She  has a past medical  history of Anemia, Arthritis, Atrial fibrillation ([place]), Atrial fibrillation ([place]), Blood transfusion without reported diagnosis, Breast cancer ([place]), Chronic bilateral pleural effusions, Colon polyp, Complete heart block ([place]), Complication of anesthesia, Dyslipidemia, Dyspnea, H/O pleural effusion, Hepatitis, History of dysphagia, History of hiatal hernia, History of kidney stones, thyroid cancer, Hyperlipidemia, Hypothyroidism, Lung cancer, lower lobe ([place]) (01/2017), Morbid obesity ([place]), Non Hodgkin's lymphoma ([place]), Persistent atrial fibrillation ([place]), Presence of permanent cardiac pacemaker, Sinus node dysfunction ([place]), Stroke ([place]), and SVC syndrome.  Past Surgical History: She  has a past surgical history that includes Thyroidectomy (1998); bso (1998); Cholecystectomy; Tonsillectomy; Colonoscopy w/ polypectomy; afib ablation; Abdominal hysterectomy (1983); Breast lumpectomy (2010); Laparoscopic gastric banding (07/10/2010); Appendectomy; Knee arthroscopy; laparotomy; Total knee arthroplasty (04/13/2012); Cardioversion (10/09/2012); Cardioversion (10/09/2012); Cardioversion (N/A, 11/20/2012); Pacemaker insertion (03/10/2013); pocket revision (N/A, 12/08/2013); Eye surgery; Cystoscopy with retrograde pyelogram, ureteroscopy and stent placement (Right, 02/06/2015); Holmium laser application (N/A, 9/37/1696); Cystoscopy (N/A, 02/06/2015); Video bronchoscopy with endobronchial ultrasound (N/A, 02/07/2017); IR FLUORO GUIDE PORT INSERTION RIGHT (02/24/2017); IR US Guide Vasc Access Right (02/24/2017); Bone marrow biopsy (02/21/2017); Cystoscopy with retrograde pyelogram, ureteroscopy and stent placement (Right, 03/07/2017); Holmium laser application (Right, 7/89/3810); IR PATIENT EVAL TECH 0-60 MINS (03/11/2017); Cardioversion (N/A, 07/18/2017); TEE with cardioversion (09/22/2017); PORTA CATH INSERTION; TEE without cardioversion (N/A, 10/03/2017); Cardioversion (N/A, 10/03/2017); IR NEPHROSTOMY PLACEMENT RIGHT (11/17/2017);  Nephrolithotomy (Right, 11/17/2017); Cystoscopy w/ retrogrades (Left, 11/17/2017); Holmium laser application (Left, 1/75/1025);  and Cardioversion (N/A, 01/07/2018).  Family History: Her family history includes Colon cancer in her father; Diabetes in her brother, paternal aunt, paternal grandmother, and sister; Heart attack in her father; Heart disease (age of onset: 75) in her father.  Social History: She  reports that she has never smoked. She has never used smokeless tobacco. She reports that she does not drink alcohol or use drugs.  Medications: Allergies as of 03/13/2018      Reactions   Tikosyn [dofetilide] Other (See Comments)   Prolonged QT interval Prolonged QT interval   Xarelto [rivaroxaban] Other (See Comments)   Nose Bleed X 6 hrs ; packing in ER   Epinephrine Other (See Comments), Rash   Dental form only (liquid). Patient stated she will become "out of it" she can hear you but cannot respond in normal fashion.   Ketamine    Hallucinations   Oral Anesthetic [benzocaine]    Adhesive [tape] Rash   Paper tape as well      Medication List        Accurate as of 03/13/18  2:33 PM. Always use your most recent med list.          amiodarone 200 MG tablet Commonly known as:  PACERONE Take 1 tablet (200 mg total) by mouth 2 (two) times daily.   cycloSPORINE 0.05 % ophthalmic emulsion Commonly known as:  RESTASIS Place 1 drop into both eyes 2 (two) times daily.   diphenhydramine-acetaminophen 25-500 MG Tabs tablet Commonly known as:  TYLENOL PM Take 2 tablets by mouth at bedtime.   ELIQUIS 5 MG Tabs tablet Generic drug:  apixaban TAKE 1 TABLET BY MOUTH TWO  TIMES DAILY   furosemide 20 MG tablet Commonly known as:  LASIX Take 1 tablets daily till you see your cardiolgist   levothyroxine 175 MCG tablet Commonly known as:  SYNTHROID, LEVOTHROID Take 175 mcg by mouth See admin instructions. TAKE 1 TABLET (175 MCG) BY MOUTH DAILY, EXCEPT FRIDAYS.   multivitamin  tablet Take 1 tablet by mouth daily.   oxyCODONE-acetaminophen 5-325 MG tablet Commonly known as:  PERCOCET/ROXICET Take 1 tablet by mouth daily as needed for severe pain.   rosuvastatin 10 MG tablet Commonly known as:  CRESTOR Take 10 mg by mouth daily.   XIIDRA 5 % Soln Generic drug:  Lifitegrast Apply 1 drop to eye 2 (two) times daily.

## 2018-03-13 NOTE — Patient Instructions (Signed)
Follow up in August 2019 after getting CT chest

## 2018-03-15 IMAGING — MG 2D DIGITAL DIAGNOSTIC BILATERAL MAMMOGRAM WITH CAD AND ADJUNCT T
8 of 14 series · 8 of 30 positions shown · non-contrast
Comparison: Previous exam(s).

CLINICAL DATA: 70-year-old female with history of left lumpectomy
in 0757.

EXAM:
2D DIGITAL DIAGNOSTIC BILATERAL MAMMOGRAM WITH CAD AND ADJUNCT TOMO

[L CC (1 of 2)]
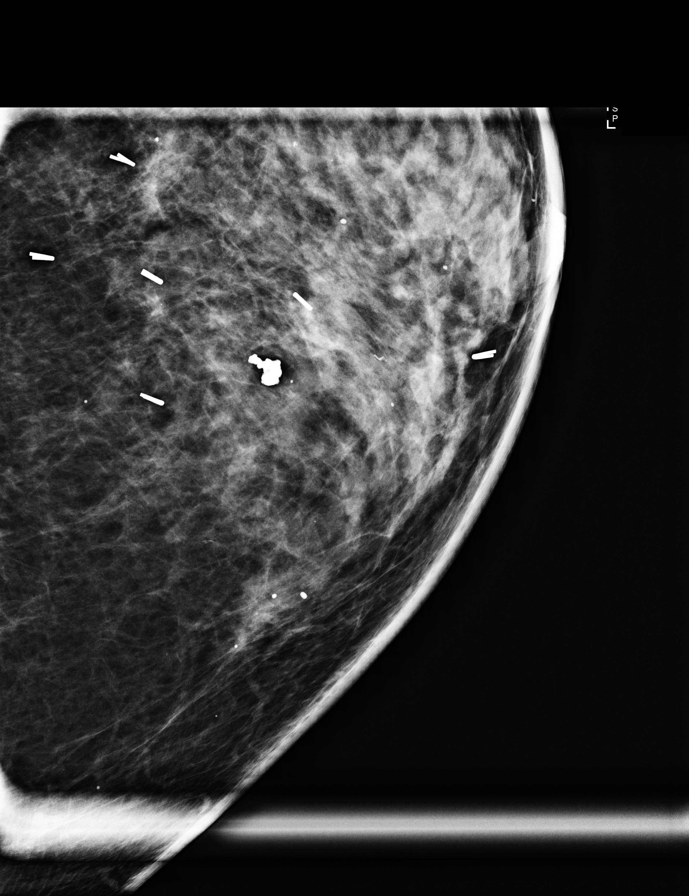

[L MLO]
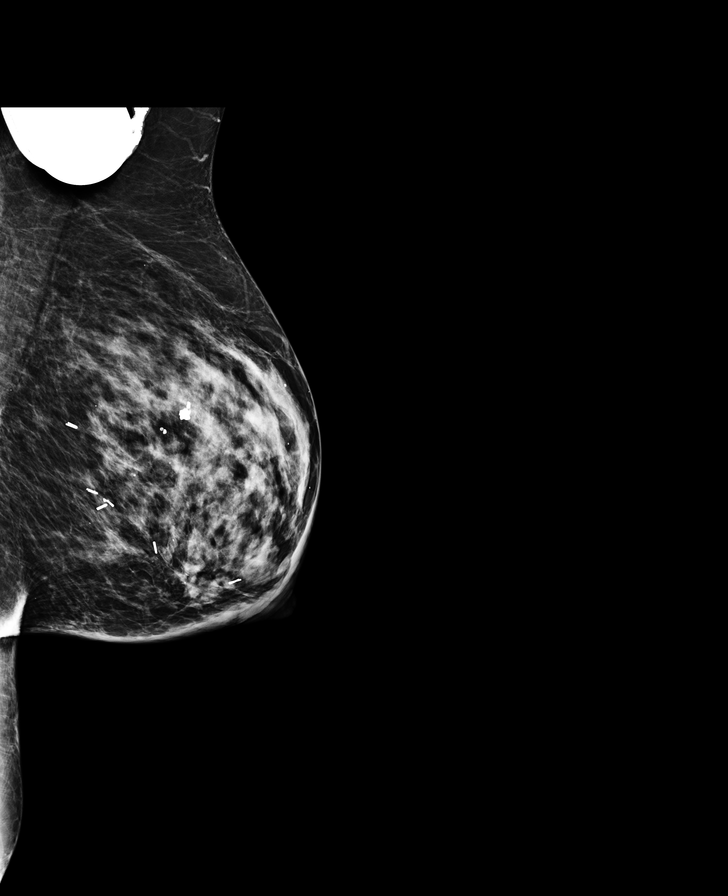

[R CC synth-2D]
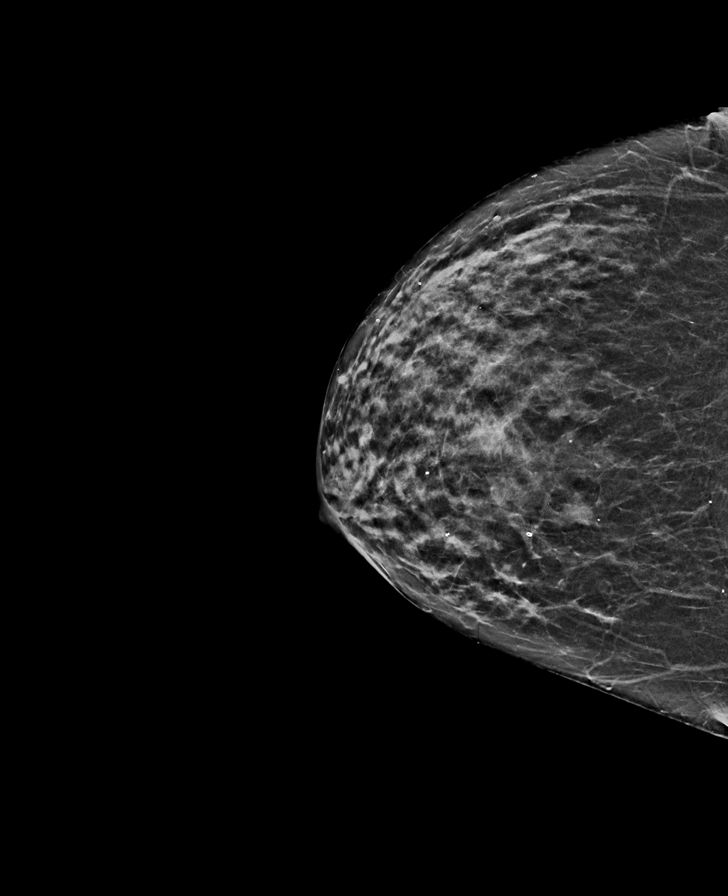

[R CC]
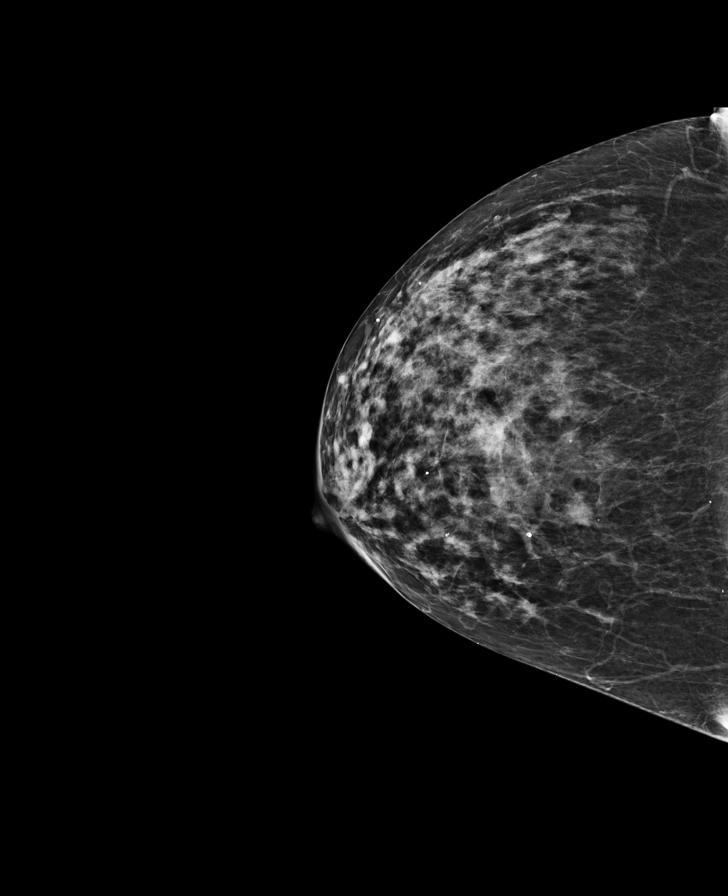

[L CC (2 of 2)]
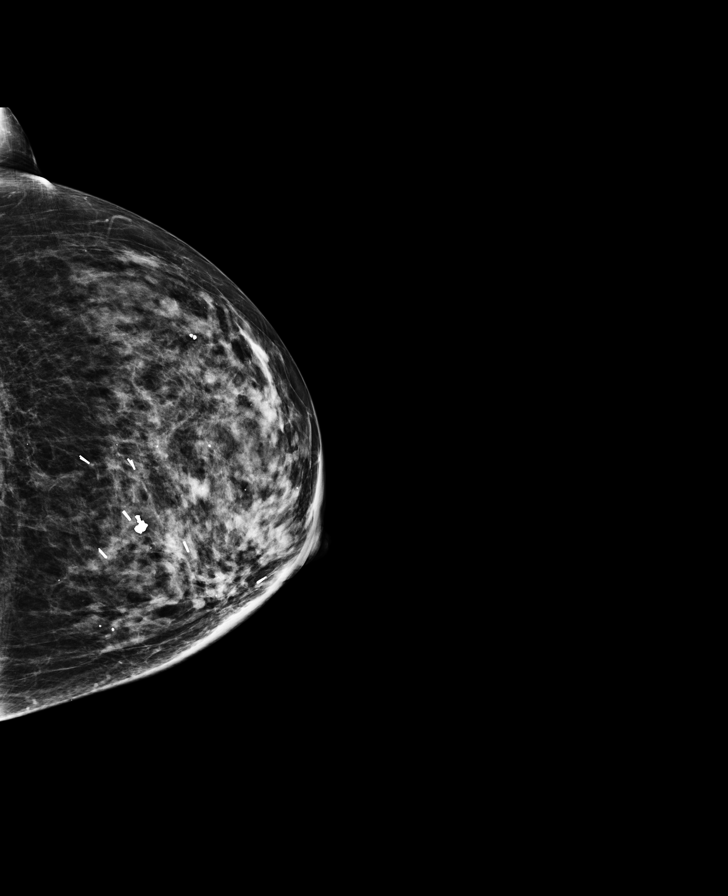

[R MLO synth-2D]
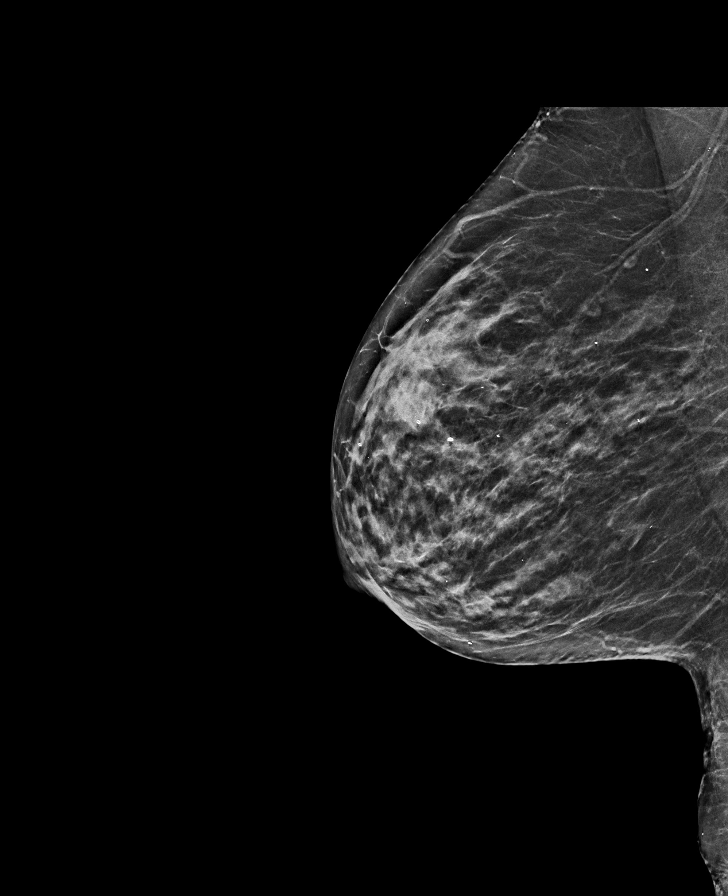

[L CC synth-2D]
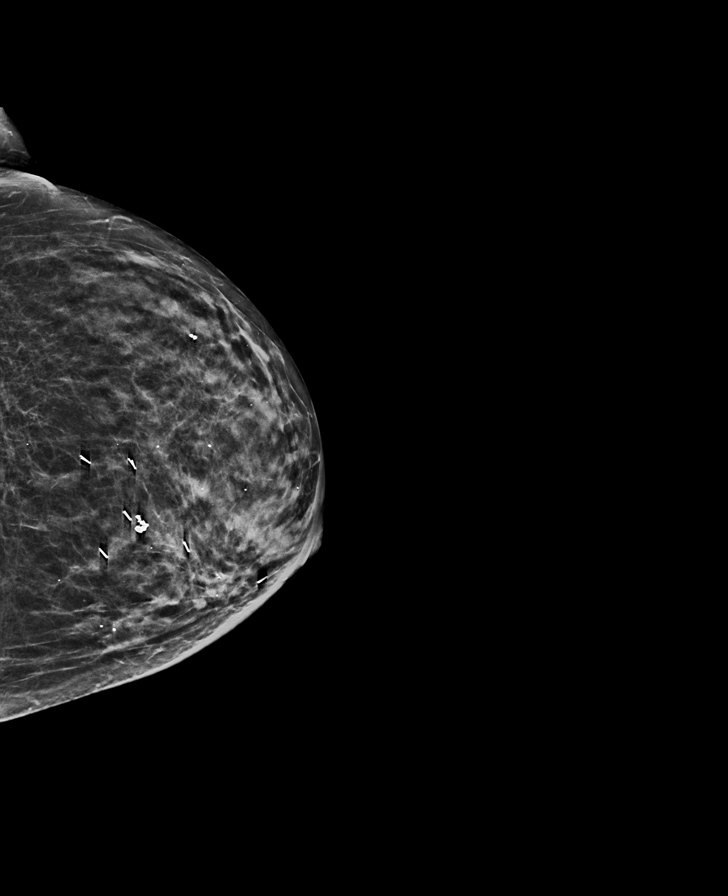

[L MLO synth-2D]
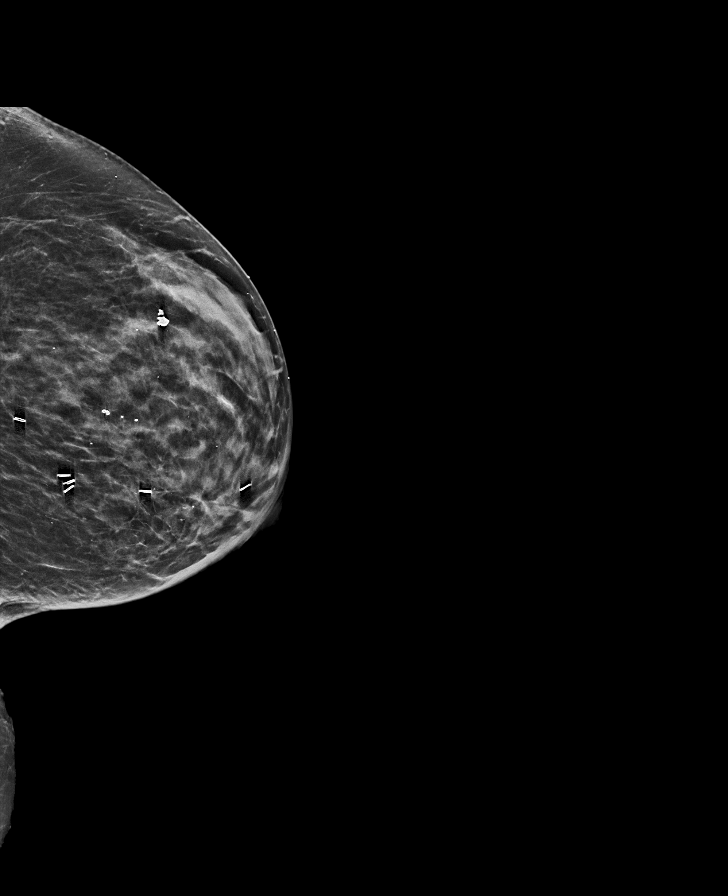

[8 of 30 positions shown; findings below may reference images not displayed]

ACR Breast Density Category c: The breast tissue is heterogeneously
dense, which may obscure small masses.
FINDINGS: Posttreatment changes are again seen of the medial left breast. No
new or suspicious abnormality is identified within either breast.

Mammographic images were processed with CAD.
IMPRESSION: No mammographic evidence of malignancy.

RECOMMENDATION:
Screening mammogram in one year.(Code:CU-[id number])

I have discussed the findings and recommendations with the patient.
Results were also provided in writing at the conclusion of the
visit. If applicable, a reminder letter will be sent to the patient
regarding the next appointment.

BI-RADS CATEGORY  2: Benign.

## 2018-03-27 ENCOUNTER — Telehealth: Payer: Self-pay | Admitting: Pulmonary Disease

## 2018-03-27 NOTE — Telephone Encounter (Signed)
She had a new finding on her CT chest from May 2019.  She needs to have f/u CT chest in August 2019 to compare with scan from May.  Based on CT results in August, can then d/w Dr. Lindi Adie with oncology about whether her CT scan in November 2019 can be postponed to later date.

## 2018-03-27 NOTE — Telephone Encounter (Signed)
Pt has pending CT wo contrast for 04/2018. When called to schedule CT, pt stated she has pending CT with contrast in 07/2018 ordered by oncology. Pt is wanting to know if it is necessary to have both CT's.  VS please advise. Thanks

## 2018-03-27 NOTE — Telephone Encounter (Signed)
Pt is aware of the CT appt

## 2018-03-27 NOTE — Telephone Encounter (Signed)
Pt is aware of below message and voiced her understanding. Pt is agreeable to CT in 04/2018.  PCC's can you guys contact pt to schedule CT. Thanks

## 2018-04-15 ENCOUNTER — Encounter: Payer: Self-pay | Admitting: Internal Medicine

## 2018-04-16 NOTE — Telephone Encounter (Signed)
Spoke with pt today regarding her surgical clearance and Afib. Pt states she has been in Afib for about 2 weeks. She has remained compliant with her anticoagulation.   I called Stacy at Afib clinic and unfortunately there are no sooner appointments before Aug 1. Per Marzetta Board, RN at [facility], pt should increase her amiodarone to two tablets (200mg  bid) per day (Per Kerby Less recommendation). Pt currently is taking only one 200mg  tab per day. Pt agreed to increase dose.  I offered pt a sooner appointment with Chanetta Marshall on 7/31, but she stated she will keep her appt with [name]. I advised pt if she becomes more symptomatic, she can visit the ED for a DCCV as well.   Pt is concerned with surgical clearance for upcoming shoulder surgery. I advised pt to discuss with [name] since she will most likely need to stay therapeutic on her anticoagulation after her cardioversion. Pt understands her last surgical clearance (from April 2019) may not be sufficient given recent changes. Her surgeon may need to submit a new request.    Pt has verbalized understanding and will call back with any additional questions.

## 2018-04-17 ENCOUNTER — Other Ambulatory Visit: Payer: Self-pay | Admitting: Student

## 2018-04-20 ENCOUNTER — Encounter (HOSPITAL_COMMUNITY): Payer: Self-pay

## 2018-04-20 ENCOUNTER — Ambulatory Visit (HOSPITAL_COMMUNITY)
Admission: RE | Admit: 2018-04-20 | Discharge: 2018-04-20 | Disposition: A | Discharge status: Home / Self Care | Payer: Medicare Other | Source: Ambulatory Visit | Attending: Hematology and Oncology | Admitting: Hematology and Oncology

## 2018-04-20 DIAGNOSIS — Z8673 Personal history of transient ischemic attack (TIA), and cerebral infarction without residual deficits: Secondary | ICD-10-CM | POA: Insufficient documentation

## 2018-04-20 DIAGNOSIS — E785 Hyperlipidemia, unspecified: Secondary | ICD-10-CM | POA: Insufficient documentation

## 2018-04-20 DIAGNOSIS — M1990 Unspecified osteoarthritis, unspecified site: Secondary | ICD-10-CM | POA: Insufficient documentation

## 2018-04-20 DIAGNOSIS — E039 Hypothyroidism, unspecified: Secondary | ICD-10-CM | POA: Diagnosis not present

## 2018-04-20 DIAGNOSIS — Z8572 Personal history of non-Hodgkin lymphomas: Secondary | ICD-10-CM | POA: Diagnosis not present

## 2018-04-20 DIAGNOSIS — Z9884 Bariatric surgery status: Secondary | ICD-10-CM | POA: Insufficient documentation

## 2018-04-20 DIAGNOSIS — I442 Atrioventricular block, complete: Secondary | ICD-10-CM | POA: Insufficient documentation

## 2018-04-20 DIAGNOSIS — Z452 Encounter for adjustment and management of vascular access device: Secondary | ICD-10-CM | POA: Diagnosis not present

## 2018-04-20 DIAGNOSIS — Z7901 Long term (current) use of anticoagulants: Secondary | ICD-10-CM | POA: Insufficient documentation

## 2018-04-20 DIAGNOSIS — Z923 Personal history of irradiation: Secondary | ICD-10-CM | POA: Diagnosis not present

## 2018-04-20 DIAGNOSIS — Z853 Personal history of malignant neoplasm of breast: Secondary | ICD-10-CM

## 2018-04-20 DIAGNOSIS — Z950 Presence of cardiac pacemaker: Secondary | ICD-10-CM | POA: Insufficient documentation

## 2018-04-20 DIAGNOSIS — Z5111 Encounter for antineoplastic chemotherapy: Secondary | ICD-10-CM | POA: Diagnosis not present

## 2018-04-20 HISTORY — PX: IR REMOVAL TUN ACCESS W/ PORT W/O FL MOD SED: IMG2290

## 2018-04-20 LAB — CBC
HCT: 37.2 % (ref 36.0–46.0)
Hemoglobin: 11.8 g/dL — ABNORMAL LOW (ref 12.0–15.0)
MCH: 29.5 pg (ref 26.0–34.0)
MCHC: 31.7 g/dL (ref 30.0–36.0)
MCV: 93 fL (ref 78.0–100.0)
Platelets: 202 10*3/uL (ref 150–400)
RBC: 4 MIL/uL (ref 3.87–5.11)
RDW: 14.7 % (ref 11.5–15.5)
WBC: 4.7 10*3/uL (ref 4.0–10.5)

## 2018-04-20 LAB — PROTIME-INR
INR: 0.99
PROTHROMBIN TIME: 13 s (ref 11.4–15.2)

## 2018-04-20 LAB — APTT: aPTT: 31 seconds (ref 24–36)

## 2018-04-20 MED ORDER — SODIUM CHLORIDE 0.9 % IV SOLN
INTRAVENOUS | Status: DC
  Administered 2018-04-20: 08:00:00 via INTRAVENOUS

## 2018-04-20 MED ORDER — FLUMAZENIL 0.5 MG/5ML IV SOLN
INTRAVENOUS | Status: AC
  Filled 2018-04-20: qty 5

## 2018-04-20 MED ORDER — NALOXONE HCL 0.4 MG/ML IJ SOLN
INTRAMUSCULAR | Status: AC
  Filled 2018-04-20: qty 1

## 2018-04-20 MED ORDER — MIDAZOLAM HCL 2 MG/2ML IJ SOLN
INTRAMUSCULAR | Status: AC
  Filled 2018-04-20: qty 4

## 2018-04-20 MED ORDER — FENTANYL CITRATE (PF) 100 MCG/2ML IJ SOLN
INTRAMUSCULAR | Status: AC | PRN
  Administered 2018-04-20 (×2): 50 ug via INTRAVENOUS

## 2018-04-20 MED ORDER — FENTANYL CITRATE (PF) 100 MCG/2ML IJ SOLN
INTRAMUSCULAR | Status: AC
  Filled 2018-04-20: qty 2

## 2018-04-20 MED ORDER — LIDOCAINE-EPINEPHRINE (PF) 2 %-1:200000 IJ SOLN
INTRAMUSCULAR | Status: AC
  Filled 2018-04-20: qty 20

## 2018-04-20 MED ORDER — CEFAZOLIN SODIUM-DEXTROSE 2-4 GM/100ML-% IV SOLN
INTRAVENOUS | Status: AC
  Administered 2018-04-20: 2 g via INTRAVENOUS
  Filled 2018-04-20: qty 100

## 2018-04-20 MED ORDER — CEFAZOLIN SODIUM-DEXTROSE 2-4 GM/100ML-% IV SOLN
2.0000 g | INTRAVENOUS | Status: AC
  Administered 2018-04-20: 2 g via INTRAVENOUS

## 2018-04-20 MED ORDER — MIDAZOLAM HCL 2 MG/2ML IJ SOLN
INTRAMUSCULAR | Status: AC | PRN
  Administered 2018-04-20 (×3): 1 mg via INTRAVENOUS

## 2018-04-20 NOTE — Discharge Instructions (Addendum)
Implanted Port Removal, Care After °Refer to this sheet in the next few weeks. These instructions provide you with information about caring for yourself after your procedure. Your health care provider may also give you more specific instructions. Your treatment has been planned according to current medical practices, but problems sometimes occur. Call your health care provider if you have any problems or questions after your procedure. °What can I expect after the procedure? °After the procedure, it is common to have: °· Soreness or pain near your incision. °· Some swelling or bruising near your incision. ° °Follow these instructions at home: °Medicines °· Take over-the-counter and prescription medicines only as told by your health care provider. °· If you were prescribed an antibiotic medicine, take it as told by your health care provider. Do not stop taking the antibiotic even if you start to feel better. °Bathing °· Do not take baths, swim, or use a hot tub until your health care provider approves. Ask your health care provider if you can take showers. You may only be allowed to take sponge baths for bathing. °Incision care °· Follow instructions from your health care provider about how to take care of your incision. Make sure you: °? Wash your hands with soap and water before you change your bandage (dressing). If soap and water are not available, use hand sanitizer. °? Change your dressing as told by your health care provider. °? Keep your dressing dry. °? Leave stitches (sutures), skin glue, or adhesive strips in place. These skin closures may need to stay in place for 2 weeks or longer. If adhesive strip edges start to loosen and curl up, you may trim the loose edges. Do not remove adhesive strips completely unless your health care provider tells you to do that. °· Check your incision area every day for signs of infection. Check for: °? More redness, swelling, or pain. °? More fluid or  blood. °? Warmth. °? Pus or a bad smell. °Driving °· If you received a sedative, do not drive for 24 hours after the procedure. °· If you did not receive a sedative, ask your health care provider when it is safe to drive. °Activity °· Return to your normal activities as told by your health care provider. Ask your health care provider what activities are safe for you. °· Until your health care provider says it is safe: °? Do not lift anything that is heavier than 10 lb (4.5 kg). °? Do not do activities that involve lifting your arms over your head. °General instructions °· Do not use any tobacco products, such as cigarettes, chewing tobacco, and e-cigarettes. Tobacco can delay healing. If you need help quitting, ask your health care provider. °· Keep all follow-up visits as told by your health care provider. This is important. °Contact a health care provider if: °· You have more redness, swelling, or pain around your incision. °· You have more fluid or blood coming from your incision. °· Your incision feels warm to the touch. °· You have pus or a bad smell coming from your incision. °· You have a fever. °· You have pain that is not relieved by your pain medicine. °Get help right away if: °· You have chest pain. °· You have difficulty breathing. °This information is not intended to replace advice given to you by your health care provider. Make sure you discuss any questions you have with your health care provider. °Document Released: 08/21/2015 Document Revised: 02/15/2016 Document Reviewed: 06/14/2015 °Elsevier Interactive Patient   Education © 2018 [employer] °Moderate Conscious Sedation, Adult, Care After °These instructions provide you with information about caring for yourself after your procedure. Your health care provider may also give you more specific instructions. Your treatment has been planned according to current medical practices, but problems sometimes occur. Call your health care provider if you have  any problems or questions after your procedure. °What can I expect after the procedure? °After your procedure, it is common: °· To feel sleepy for several hours. °· To feel clumsy and have poor balance for several hours. °· To have poor judgment for several hours. °· To vomit if you eat too soon. ° °Follow these instructions at home: °For at least 24 hours after the procedure: ° °· Do not: °? Participate in activities where you could fall or become injured. °? Drive. °? Use heavy machinery. °? Drink alcohol. °? Take sleeping pills or medicines that cause drowsiness. °? Make important decisions or sign legal documents. °? Take care of children on your own. °· Rest. °Eating and drinking °· Follow the diet recommended by your health care provider. °· If you vomit: °? Drink water, juice, or soup when you can drink without vomiting. °? Make sure you have little or no nausea before eating solid foods. °General instructions °· Have a responsible adult stay with you until you are awake and alert. °· Take over-the-counter and prescription medicines only as told by your health care provider. °· If you smoke, do not smoke without supervision. °· Keep all follow-up visits as told by your health care provider. This is important. °Contact a health care provider if: °· You keep feeling nauseous or you keep vomiting. °· You feel light-headed. °· You develop a rash. °· You have a fever. °Get help right away if: °· You have trouble breathing. °This information is not intended to replace advice given to you by your health care provider. Make sure you discuss any questions you have with your health care provider. °Document Released: 06/30/2013 Document Revised: 02/12/2016 Document Reviewed: 12/30/2015 °Elsevier Interactive Patient Education © 2018 Elsevier Inc. ° °

## 2018-04-20 NOTE — H&P (Signed)
Referring Physician(s): Nicholas Lose  Supervising Physician: Sandi Mariscal  Patient Status:  [facility]  Chief Complaint:  "I'm getting my port out"  Subjective: Patient familiar to IR service from prior right thoracenteses in 2014, bone marrow biopsy and Port-A-Cath placement in 2018 and right nephrostomy on 11/17/2017.  She has a history of non-Hodgkin's lymphoma and is status post chemoradiation.  She no longer needs her Port-A-Cath and presents today for removal.  She currently denies fever, headache, chest pain, dyspnea, cough, abdominal/back pain, nausea, vomiting or bleeding. Past Medical History:  Diagnosis Date  . Anemia   . Arthritis    osteoarthritis - knees , history of  . Atrial fibrillation [facility])    ablation- 2x's-- 1st time- [facility], 2nd event at [facility] in 2008. Convergent ablation at [facility] 6/14  . Atrial fibrillation ([place])   . Blood transfusion without reported diagnosis   . Breast cancer ([place])    Dr Margot Chimes, total thyroidectomy- 1999- for cancer  . Chronic bilateral pleural effusions   . Colon polyp    Dr Earlean Shawl  . Complete heart block ([place])   . Complication of anesthesia    Ketamine produces LSD reaction, bright colored nightmarish experience   . Dyslipidemia   . Dyspnea   . H/O pleural effusion    s/p thoracentesis w 3257m withdrawn  . Hepatitis    [place]itis   . History of dysphagia    due to radiation therapy  . History of hiatal hernia    small noted on PET scan  . History of kidney stones   . Hx of thyroid cancer    Dr SForde Dandy . Hyperlipidemia   . Hypothyroidism   . Lung cancer, lower lobe ([place] 01[place]   radiation RX completed 03/04/17; will start chemo 6/27, pt unaware of lung cancer  . [place]bid [place]    Status post lap band surgery  . Non Hodgkin's lymphoma ([place]    on chemotherapy  . Persistent atrial fibrillation ([place]    a. s/p PVI 2008 b. s/p convergent ablation [id number] by  bradycardia requiring pacemaker implant  . Presence of permanent cardiac pacemaker   . Sinus node dysfunction ([place]    Complicating convergent ablation 6/14  . Stroke ([facility]    [id number]  . SVC syndrome    Past Surgical History:  Procedure Laterality Date  . ABDOMINAL HYSTERECTOMY  1983  . afib ablation     a. 2008 PVI b. 2014 convergent ablation  . APPENDECTOMY    . BONE MARROW BIOPSY  02/21/2017  . BREAST LUMPECTOMY  2010  . bso  1998  . CARDIOVERSION  10/09/2012   Procedure: CARDIOVERSION;  Surgeon: JMinus Breeding MD;  [place]  Service: Cardiovascular;  Laterality: N/A;  . CARDIOVERSION  10/09/2012   Procedure: CARDIOVERSION;  Surgeon: JMinus Breeding MD;  [facility];  Service: Cardiovascular;  Laterality: N/A;  [name] the ok to add pt to the add on , but we must check to find out if the can add pt on at 1400 ( [phone])  . CARDIOVERSION N/A 11/20/2012   Procedure: CARDIOVERSION;  Surgeon: PFay Records MD;  [facility];  Service: Cardiovascular;  Laterality: N/A;  . CARDIOVERSION N/A 07/18/2017   Procedure: CARDIOVERSION;  Surgeon: NThayer Headings MD;  [facility];  Service: Cardiovascular;  Laterality: N/A;  . CARDIOVERSION N/A 10/03/2017   Procedure: CARDIOVERSION;  Surgeon: CSanda Klein MD;  Location: [place];  Service: Cardiovascular;  Laterality: N/A;  . CARDIOVERSION N/A 01/07/2018   Procedure: CARDIOVERSION;  Surgeon: Thayer Headings, MD;  Location: [place];  Service: Cardiovascular;  Laterality: N/A;  . CHOLECYSTECTOMY    . COLONOSCOPY W/ POLYPECTOMY     Dr Earlean Shawl  . CYSTOSCOPY N/A 02/06/2015   Procedure: CYSTOSCOPY;  Surgeon: Kathie Rhodes, MD;  [facility];  Service: Urology;  Laterality: N/A;  . CYSTOSCOPY W/ RETROGRADES Left 11/17/2017   Procedure: CYSTOSCOPY WITH RETROGRADE /PYELOGRAM/;  Surgeon: Kathie Rhodes, MD;  [facility];  Service: Urology;  Laterality: Left;  . CYSTOSCOPY WITH RETROGRADE  PYELOGRAM, URETEROSCOPY AND STENT PLACEMENT Right 02/06/2015   Procedure: RETROGRADE PYELOGRAM, RIGHT URETEROSCOPY STENT PLACEMENT;  Surgeon: Kathie Rhodes, MD;  [facility];  Service: Urology;  Laterality: Right;  . CYSTOSCOPY WITH RETROGRADE PYELOGRAM, URETEROSCOPY AND STENT PLACEMENT Right 03/07/2017   Procedure: CYSTOSCOPY WITH RIGHT RETROGRADE PYELOGRAM,RIGHT  URETEROSCOPYLASER LITHOTRIPSY  AND STENT PLACEMENT AND STONE BASKETRY;  Surgeon: Kathie Rhodes, MD;  Location: [place];  Service: Urology;  Laterality: Right;  . EYE SURGERY     cataract surgery  . HOLMIUM LASER APPLICATION N/A 10/23/8655   Procedure: HOLMIUM LASER APPLICATION;  Surgeon: Kathie Rhodes, MD;  [facility];  Service: Urology;  Laterality: N/A;  . HOLMIUM LASER APPLICATION Right 8/46/9629   Procedure: HOLMIUM LASER APPLICATION;  Surgeon: Kathie Rhodes, MD;  Location: [facility];  Service: Urology;  Laterality: Right;  . HOLMIUM LASER APPLICATION Left 02/18/4131   Procedure: HOLMIUM LASER APPLICATION;  Surgeon: Kathie Rhodes, MD;  [facility];  Service: Urology;  Laterality: Left;  . IR FLUORO GUIDE PORT INSERTION RIGHT  02/24/2017  . IR NEPHROSTOMY PLACEMENT RIGHT  11/17/2017  . IR PATIENT EVAL TECH 0-60 MINS  03/11/2017  . IR US GUIDE VASC ACCESS RIGHT  02/24/2017  . KNEE ARTHROSCOPY     bilateral  . LAPAROSCOPIC GASTRIC BANDING  07/10/2010  . LAPAROTOMY     for ruptured ovary and ovarian artery   . NEPHROLITHOTOMY Right 11/17/2017   Procedure: NEPHROLITHOTOMY PERCUTANEOUS;  Surgeon: Kathie Rhodes, MD;  [facility];  Service: Urology;  Laterality: Right;  . PACEMAKER INSERTION  03/10/2013   MDT dual chamber PPM  . POCKET REVISION N/A 12/08/2013   Procedure: POCKET REVISION;  Surgeon: Deboraha Sprang, MD;  [facility] C[facility];  Service: Cardiovascular;  Laterality: N/A;  . PORTA CATH INSERTION    . TEE WITH CARDIOVERSION  09/22/2017  . TEE WITHOUT CARDIOVERSION N/A  10/03/2017   Procedure: TRANSESOPHAGEAL ECHOCARDIOGRAM (TEE);  Surgeon: Sanda Klein, MD;  Location: [place];  Service: Cardiovascular;  Laterality: N/A;  . THYROIDECTOMY  1998   Dr Margot Chimes  . TONSILLECTOMY    . TOTAL KNEE ARTHROPLASTY  04/13/2012   Procedure: TOTAL KNEE ARTHROPLASTY;  Surgeon: Rudean Haskell, MD;  Location: [place];  Service: Orthopedics;  Laterality: Right;  Marland Kitchen VIDEO BRONCHOSCOPY WITH ENDOBRONCHIAL ULTRASOUND N/A 02/07/2017   Procedure: VIDEO BRONCHOSCOPY WITH ENDOBRONCHIAL ULTRASOUND;  Surgeon: Marshell Garfinkel, MD;  Location: [place];  Service: Pulmonary;  Laterality: N/A;      Allergies: Tikosyn [dofetilide]; Xarelto [rivaroxaban]; Epinephrine; Ketamine; Oral anesthetic [benzocaine]; and Adhesive [tape]  Medications: Prior to Admission medications   Medication Sig Start Date End Date Taking? Authorizing Provider  amiodarone (PACERONE) 200 MG tablet Take 1 tablet (200 mg total) by mouth 2 (two) times daily. 01/02/18  Yes Sherran Needs, NP  cycloSPORINE (RESTASIS) 0.05 % ophthalmic  emulsion Place 1 drop into both eyes 2 (two) times daily.   Yes [provider]  diphenhydramine-acetaminophen (TYLENOL PM) 25-500 MG TABS tablet Take 2 tablets by mouth at bedtime.    Yes [provider]  levothyroxine (SYNTHROID, LEVOTHROID) 175 MCG tablet Take 175 mcg by mouth See admin instructions. TAKE 1 TABLET (175 MCG) BY MOUTH DAILY, EXCEPT FRIDAYS.   Yes [provider]  Lifitegrast Shirley Friar) 5 % SOLN Apply 1 drop to eye 2 (two) times daily.   Yes [provider]  Multiple Vitamin (MULTIVITAMIN) tablet Take 1 tablet by mouth daily.    Yes [provider]  oxyCODONE-acetaminophen (PERCOCET/ROXICET) 5-325 MG tablet Take 1 tablet by mouth daily as needed for severe pain.   Yes [provider]  ELIQUIS 5 MG TABS tablet TAKE 1 TABLET BY MOUTH TWO  TIMES DAILY 02/24/18   Deboraha Sprang, MD  furosemide (LASIX) 20 MG tablet Take 1 tablets  daily till you see your cardiolgist Patient taking differently: Take 20 mg by mouth daily as needed for fluid or edema.  09/17/17   Bonnielee Haff, MD  rosuvastatin (CRESTOR) 10 MG tablet Take 10 mg by mouth daily.    [provider]     Vital Signs: BP 130/75 (BP Location: Left Arm)   Pulse 74   Temp 97.7 F (36.5 C) (Oral)   Resp 18   SpO2 98%   Physical Exam awake, alert.  Chest clear to auscultation bilaterally.  Clean, intact right chest wall Port-A-Cath.  Left chest wall pacer in place.  Heart with regular rate and rhythm.  Abdomen soft, positive bowel sounds, nontender.  No significant lower extremity edema.  Imaging: No results found.  Labs:  CBC: Recent Labs    11/27/17 1348 01/02/18 1440 02/17/18 1315 04/20/18 0801  WBC 4.2 6.5 4.3 4.7  HGB 11.2* 12.6 11.9 11.8*  HCT 34.8 39.3 37.4 37.2  PLT 219 199 152 202    COAGS: Recent Labs    04/28/17 1044 09/16/17 0420 09/26/17 1134 11/17/17 0755  INR 2.2 1.46 1.18 1.01    BMP: Recent Labs    11/17/17 0755 11/27/17 1348 01/02/18 1440 02/17/18 1315  NA 143 140 138 139  K 4.0 4.6 4.0 4.4  CL 110 106 105 107  CO2 '22 26 24 25  '$ GLUCOSE 109* 105 116* 98  BUN 27* '22 17 23  '$ CALCIUM 9.4 9.5 9.2 9.5  CREATININE 1.05* 0.98 1.07* 0.90  GFRNONAA 51* 56* 50* >60  GFRAA 60* >60 58* >60    LIVER FUNCTION TESTS: Recent Labs    09/15/17 0928 09/16/17 0420 11/27/17 1348 02/17/18 1315  BILITOT 1.2 1.1 0.5 0.5  AST '16 17 25 19  '$ ALT '15 14 23 18  '$ ALKPHOS 62 67 80 81  PROT 5.5* 5.4* 6.5 6.8  ALBUMIN 3.0* 3.0* 3.5 3.8    Assessment and Plan: Pt with history of non-Hodgkin's lymphoma ; status post chemoradiation.  She no longer needs her Port-A-Cath and presents today for removal.  Details/risks of procedure, including but not limited to, internal bleeding, infection, injury to adjacent structures discussed with patient with her understanding and consent.   Electronically Signed: D. Rowe Robert,  PA-C 04/20/2018, 8:11 AM   I spent a total of 20 minutes at the the patient's bedside AND on the patient's hospital floor or unit, greater than 50% of which was counseling/coordinating care for Port-A-Cath removal

## 2018-04-20 NOTE — Procedures (Signed)
Pre Procedural Dx: Poor venous access Post Procedural Dx: Same  Successful removal of anterior chest wall port-a-cath.  EBL: Minimal  No immediate post procedural complications.   Jay Tyreak Reagle, MD Pager #: [phone]   

## 2018-04-20 NOTE — Sedation Documentation (Signed)
Patient is resting comfortably with eyes closed in NAD. 

## 2018-04-21 ENCOUNTER — Encounter: Payer: Self-pay | Admitting: Internal Medicine

## 2018-04-23 ENCOUNTER — Encounter (HOSPITAL_COMMUNITY): Payer: Self-pay | Admitting: Nurse Practitioner

## 2018-04-23 ENCOUNTER — Ambulatory Visit (HOSPITAL_COMMUNITY)
Admission: RE | Admit: 2018-04-23 | Discharge: 2018-04-23 | Disposition: A | Discharge status: Home / Self Care | Payer: Medicare Other | Source: Ambulatory Visit | Attending: Nurse Practitioner | Admitting: Nurse Practitioner

## 2018-04-23 VITALS — BP 112/58 | HR 61 | Ht 66.0 in | Wt 202.0 lb

## 2018-04-23 DIAGNOSIS — Z888 Allergy status to other drugs, medicaments and biological substances status: Secondary | ICD-10-CM | POA: Insufficient documentation

## 2018-04-23 DIAGNOSIS — Z923 Personal history of irradiation: Secondary | ICD-10-CM | POA: Diagnosis not present

## 2018-04-23 DIAGNOSIS — I442 Atrioventricular block, complete: Secondary | ICD-10-CM | POA: Diagnosis not present

## 2018-04-23 DIAGNOSIS — Z8249 Family history of ischemic heart disease and other diseases of the circulatory system: Secondary | ICD-10-CM | POA: Diagnosis not present

## 2018-04-23 DIAGNOSIS — C8590 Non-Hodgkin lymphoma, unspecified, unspecified site: Secondary | ICD-10-CM | POA: Insufficient documentation

## 2018-04-23 DIAGNOSIS — Z79890 Hormone replacement therapy (postmenopausal): Secondary | ICD-10-CM | POA: Diagnosis not present

## 2018-04-23 DIAGNOSIS — Z85850 Personal history of malignant neoplasm of thyroid: Secondary | ICD-10-CM | POA: Diagnosis not present

## 2018-04-23 DIAGNOSIS — E785 Hyperlipidemia, unspecified: Secondary | ICD-10-CM | POA: Insufficient documentation

## 2018-04-23 DIAGNOSIS — Z8673 Personal history of transient ischemic attack (TIA), and cerebral infarction without residual deficits: Secondary | ICD-10-CM | POA: Diagnosis not present

## 2018-04-23 DIAGNOSIS — Z90710 Acquired absence of both cervix and uterus: Secondary | ICD-10-CM | POA: Diagnosis not present

## 2018-04-23 DIAGNOSIS — I4891 Unspecified atrial fibrillation: Secondary | ICD-10-CM | POA: Insufficient documentation

## 2018-04-23 DIAGNOSIS — I4892 Unspecified atrial flutter: Secondary | ICD-10-CM | POA: Insufficient documentation

## 2018-04-23 DIAGNOSIS — Z950 Presence of cardiac pacemaker: Secondary | ICD-10-CM | POA: Diagnosis not present

## 2018-04-23 DIAGNOSIS — E890 Postprocedural hypothyroidism: Secondary | ICD-10-CM | POA: Diagnosis not present

## 2018-04-23 DIAGNOSIS — Z800 Family history of malignant neoplasm of digestive organs: Secondary | ICD-10-CM | POA: Diagnosis not present

## 2018-04-23 DIAGNOSIS — Z79899 Other long term (current) drug therapy: Secondary | ICD-10-CM | POA: Diagnosis not present

## 2018-04-23 DIAGNOSIS — Z96651 Presence of right artificial knee joint: Secondary | ICD-10-CM | POA: Diagnosis not present

## 2018-04-23 DIAGNOSIS — Z87442 Personal history of urinary calculi: Secondary | ICD-10-CM | POA: Insufficient documentation

## 2018-04-23 DIAGNOSIS — R002 Palpitations: Secondary | ICD-10-CM | POA: Diagnosis not present

## 2018-04-23 DIAGNOSIS — Z7901 Long term (current) use of anticoagulants: Secondary | ICD-10-CM | POA: Diagnosis not present

## 2018-04-23 DIAGNOSIS — Z833 Family history of diabetes mellitus: Secondary | ICD-10-CM | POA: Insufficient documentation

## 2018-04-23 DIAGNOSIS — Z853 Personal history of malignant neoplasm of breast: Secondary | ICD-10-CM | POA: Insufficient documentation

## 2018-04-23 DIAGNOSIS — Z9884 Bariatric surgery status: Secondary | ICD-10-CM | POA: Diagnosis not present

## 2018-04-23 DIAGNOSIS — Z85118 Personal history of other malignant neoplasm of bronchus and lung: Secondary | ICD-10-CM | POA: Diagnosis not present

## 2018-04-23 MED ORDER — AMIODARONE HCL 200 MG PO TABS: 200.0000 mg | ORAL_TABLET | Freq: Every day | ORAL | 3 refills | Status: DC

## 2018-04-23 NOTE — Progress Notes (Signed)
Primary Care Physician: [name], MD Referring Physician: Kayleena Harrison is a 72 y.o. female with a h/o a very complicated afib h/o s/p ablation 2009 at [facility] and  and convergent procedure at [facility] complicated by CHB and subsequent  PPM.  Currently on amiodarone and last was seen by Dr. Caryl Comes 12/15/17 and was doing well. She started not feeling well later in April and  EKG showed probable atypical atrial flutter with rvr at 123 bpm.Successful cardioversion in afib clinic, 4/24. She continued to stay in [place].   Pt is being seen in the afib clinic 8/1, for sensation  of skipped beats and lightheadedness over the last couple of weeks and thought she was in afib. She was asked to increase amiodarone to bid. She is interrogated in the office today and is in [place]. Her afib burden is less than 0.1% and the only afib was noted July 9th at 6:30 am for 3 mins. She states that since she was intermittently lightheaded and would feel a few palpitations, she assumed it was her heart. She had her port removed last week and is pending shoulder surgery 8/22. She was noted  to have an occasional PVC, but burden is low.  Today, she denies symptoms of  chest pain, shortness of breath, orthopnea, PND, lower extremity edema, dizziness, presyncope, syncope, or neurologic sequela. + lightheadedness and palps  .The patient is tolerating medications without difficulties and is otherwise without complaint today.   Past Medical History:  Diagnosis Date  . Anemia   . Arthritis    osteoarthritis - knees , history of  . Atrial fibrillation [facility])    ablation- 2x's-- 1st time- [facility], 2nd event at [facility] in 2008. Convergent ablation at [facility] 6/14  . Atrial fibrillation ([place])   . Blood transfusion without reported diagnosis   . Breast cancer ([place])    Dr Margot Chimes, total thyroidectomy- 1999- for cancer  . Chronic bilateral pleural effusions   . Colon polyp    Dr Earlean Shawl  . Complete heart block ([place])   .  Complication of anesthesia    Ketamine produces LSD reaction, bright colored nightmarish experience   . Dyslipidemia   . Dyspnea   . H/O pleural effusion    s/p thoracentesis w 322m withdrawn  . Hepatitis    [place]hepatitis   . History of dysphagia    due to radiation therapy  . History of hiatal hernia    small noted on PET scan  . History of kidney stones   . Hx of thyroid cancer    Dr SForde Dandy . Hyperlipidemia   . Hypothyroidism   . Lung cancer, lower lobe ([place] 01[place]2017   radiation RX completed 03/04/17; will start chemo 6/27, pt unaware of lung cancer  . Morbid obesity ([place]    Status post lap band surgery  . Non Hodgkin's lymphoma ([place]    on chemotherapy  . Persistent atrial fibrillation ([place]    a. s/p PVI 2008 b. s/p convergent ablation 20254complicated by bradycardia requiring pacemaker implant  . Presence of permanent cardiac pacemaker   . Sinus node dysfunction ([place]    Complicating convergent ablation 6/14  . [place]    [place]  . SVC syndrome    Past Surgical History:  Procedure Laterality Date  . ABDOMINAL HYSTERECTOMY  1983  . afib ablation     a. 2008 PVI b. 2014 convergent ablation  .  APPENDECTOMY    . BONE MARROW BIOPSY  02/21/2017  . BREAST LUMPECTOMY  2010  . bso  1998  . CARDIOVERSION  10/09/2012   Procedure: CARDIOVERSION;  Surgeon: Minus Breeding, MD;  Location: [place];  Service: Cardiovascular;  Laterality: N/A;  . CARDIOVERSION  10/09/2012   Procedure: CARDIOVERSION;  Surgeon: Minus Breeding, MD;  Location: [facility] ENDOSCOPY;  Service: Cardiovascular;  Laterality: N/A;  [name] gave the ok to add pt to the add on , but we must check to find out if the can add pt on at [phone] ( [phone])  . CARDIOVERSION N/A 11/20/2012   Procedure: CARDIOVERSION;  Surgeon: Fay Records, MD;  Location: [facility] ENDOSCOPY;  Service: Cardiovascular;  Laterality: N/A;  . CARDIOVERSION N/A 07/18/2017   Procedure: CARDIOVERSION;   Surgeon: Thayer Headings, MD;  Location: [facility] END[facility]COPY;  Service: Cardiovascular;  Laterality: N/A;  . CARDIOVERSION N/A 10/03/2017   Procedure: CARDIOVERSION;  Surgeon: Sanda Klein, MD;  Location: [facility] ENDOSCOPY;  Service: Cardiovascular;  Laterality: N/A;  . CARDIOVERSION N/A 01/07/2018   Procedure: CARDIOVERSION;  Surgeon: Acie Fredrickson Wonda Cheng, MD;  Location: [place];  Service: Cardiovascular;  Laterality: N/A;  . CHOLECYSTECTOMY    . COLONOSCOPY W/ POLYPECTOMY     Dr Earlean Shawl  . CYSTOSCOPY N/A 02/06/2015   Procedure: CYSTOSCOPY;  Surgeon: Kathie Rhodes, MD;  [facility]: [facility];  Service: Urology;  Laterality: N/A;  . CYSTOSCOPY W/ RETROGRADES Left 11/17/2017   Procedure: CYSTOSCOPY WITH RETROGRADE /PYELOGRAM/;  Surgeon: Kathie Rhodes, MD;  Location: [facility];  Service: Urology;  Laterality: Left;  . CYSTOSCOPY WITH RETROGRADE PYELOGRAM, URETEROSCOPY AND STENT PLACEMENT Right 02/06/2015   Procedure: RETROGRADE PYELOGRAM, RIGHT URETEROSCOPY STENT PLACEMENT;  Surgeon: Kathie Rhodes, MD;  [facility];  Service: Urology;  Laterality: Right;  . CYSTOSCOPY WITH RETROGRADE PYELOGRAM, URETEROSCOPY AND STENT PLACEMENT Right 03/07/2017   Procedure: CYSTOSCOPY WITH RIGHT RETROGRADE PYELOGRAM,RIGHT  URETEROSCOPYLASER LITHOTRIPSY  AND STENT PLACEMENT AND STONE BASKETRY;  Surgeon: Kathie Rhodes, MD;  Location: [place];  Service: Urology;  Laterality: Right;  . EYE SURGERY     cataract surgery  . HOLMIUM LASER APPLICATION N/A 6/96/2952   Procedure: HOLMIUM LASER APPLICATION;  Surgeon: Kathie Rhodes, MD;  [facility];  Service: Urology;  Laterality: N/A;  . HOLMIUM LASER APPLICATION Right 8/41/3244   Procedure: HOLMIUM LASER APPLICATION;  Surgeon: Kathie Rhodes, MD;  Location: [facility];  Service: Urology;  Laterality: Right;  . HOLMIUM LASER APPLICATION Left 0/06/2724   Procedure: HOLMIUM LASER APPLICATION;  Surgeon: Kathie Rhodes, MD;  [facility];  Service:  Urology;  Laterality: Left;  . IR FLUORO GUIDE PORT INSERTION RIGHT  02/24/2017  . IR NEPHROSTOMY PLACEMENT RIGHT  11/17/2017  . IR PATIENT EVAL TECH 0-60 MINS  03/11/2017  . IR REMOVAL TUN ACCESS W/ PORT W/O FL MOD SED  04/20/2018  . IR US GUIDE VASC ACCESS RIGHT  02/24/2017  . KNEE ARTHROSCOPY     bilateral  . LAPAROSCOPIC GASTRIC BANDING  07/10/2010  . LAPAROTOMY     for ruptured ovary and ovarian artery   . NEPHROLITHOTOMY Right 11/17/2017   Procedure: NEPHROLITHOTOMY PERCUTANEOUS;  Surgeon: Kathie Rhodes, MD;  [facility];  Service: Urology;  Laterality: Right;  . PACEMAKER INSERTION  03/10/2013   MDT dual chamber PPM  . POCKET REVISION N/A 12/08/2013   Procedure: POCKET REVISION;  Surgeon: Deboraha Sprang, MD;  Location: [facility] CATH LAB;  Service: Cardiovascular;  Laterality: N/A;  . PORTA CATH INSERTION    .  TEE WITH CARDIOVERSION  09/22/2017  . TEE WITHOUT CARDIOVERSION N/A 10/03/2017   Procedure: TRANSESOPHAGEAL ECHOCARDIOGRAM (TEE);  Surgeon: Sanda Klein, MD;  Location: [place];  Service: Cardiovascular;  Laterality: N/A;  . THYROIDECTOMY  1998   Dr Margot Chimes  . TONSILLECTOMY    . TOTAL KNEE ARTHROPLASTY  04/13/2012   Procedure: TOTAL KNEE ARTHROPLASTY;  Surgeon: Rudean Haskell, MD;  Location: [place];  Service: Orthopedics;  Laterality: Right;  Marland Kitchen VIDEO BRONCHOSCOPY WITH ENDOBRONCHIAL ULTRASOUND N/A 02/07/2017   Procedure: VIDEO BRONCHOSCOPY WITH ENDOBRONCHIAL ULTRASOUND;  Surgeon: Marshell Garfinkel, MD;  Location: [place];  Service: Pulmonary;  Laterality: N/A;    Current Outpatient Medications  Medication Sig Dispense Refill  . amiodarone (PACERONE) 200 MG tablet Take 1 tablet (200 mg total) by mouth daily. 90 tablet 3  . cycloSPORINE (RESTASIS) 0.05 % ophthalmic emulsion Place 1 drop into both eyes 2 (two) times daily.    Marland Kitchen ELIQUIS 5 MG TABS tablet TAKE 1 TABLET BY MOUTH TWO  TIMES DAILY 180 tablet 2  . furosemide (LASIX) 20 MG tablet Take 1 tablets daily till you see your  cardiolgist (Patient taking differently: Take 20 mg by mouth daily as needed for fluid or edema. ) 30 tablet 0  . levothyroxine (SYNTHROID, LEVOTHROID) 175 MCG tablet Take 175 mcg by mouth See admin instructions. TAKE 1 TABLET (175 MCG) BY MOUTH DAILY, EXCEPT FRIDAYS.    Marland Kitchen Lifitegrast (XIIDRA) 5 % SOLN Apply 1 drop to eye daily.     . Multiple Vitamin (MULTIVITAMIN) tablet Take 1 tablet by mouth daily.     . rosuvastatin (CRESTOR) 10 MG tablet Take 10 mg by mouth once a week. Once a week on Wednesday    . oxyCODONE-acetaminophen (PERCOCET/ROXICET) 5-325 MG tablet Take 1 tablet by mouth daily as needed for severe pain.     No current facility-administered medications for this encounter.     Allergies  Allergen Reactions  . Tikosyn [Dofetilide] Other (See Comments)    Prolonged QT interval Prolonged QT interval  . Xarelto [Rivaroxaban] Other (See Comments)    Nose Bleed X 6 hrs ; packing in ER  . Epinephrine Other (See Comments) and Rash    Dental form only (liquid). Patient stated she will become "out of it" she can hear you but cannot respond in normal fashion.  Marland Kitchen Ketamine     Hallucinations  . Oral Anesthetic [Benzocaine]   . Adhesive [Tape] Rash    Paper tape as well    Social History   Socioeconomic History  . Marital status: Single    Spouse name: Not on file  . Number of children: 0  . Years of education: Not on file  . Highest education level: Not on file  Occupational History  . 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    Employer: RETIRED  Social Needs  . Financial resource strain: Not on file  . Food insecurity:    Worry: Not on file    Inability: Not on file  . Transportation needs:    Medical: Not on file    Non-medical: Not on file  Tobacco Use  . Smoking status: Never Smoker  . Smokeless tobacco: Never Used  Substance and Sexual Activity  . Alcohol use: No  . Drug use: No  . Sexual activity: Never  Lifestyle  . Physical activity:     Days per week: Not on file    Minutes per session: Not on file  . Stress: Not on file  Relationships  .  Social connections:    Talks on phone: Not on file    Gets together: Not on file    Attends religious service: Not on file    Active member of club or organization: Not on file    Attends meetings of clubs or organizations: Not on file    Relationship status: Not on file  . Intimate partner violence:    Fear of current or ex partner: Not on file    Emotionally abused: Not on file    Physically abused: Not on file    Forced sexual activity: Not on file  Other Topics Concern  . Not on file  Social History Narrative   SINGLE    NO CHILDREN   NEVER SMOKED   EXERCISE 3 X WK          Family History  Problem Relation Age of Onset  . Heart disease Father 61       MI '@autopsy'$   . Colon cancer Father        COLON  . Heart attack Father   . Diabetes Sister   . Diabetes Brother   . Diabetes Paternal Aunt   . Diabetes Paternal Grandmother     ROS- All systems are reviewed and negative except as per the HPI above  Physical Exam: Vitals:   04/23/18 1358  BP: (!) 112/58  Pulse: 61  Weight: 202 lb (91.6 kg)  Height: '5\' 6"'$  (1.676 m)   Wt Readings from Last 3 Encounters:  04/23/18 202 lb (91.6 kg)  03/13/18 197 lb (89.4 kg)  02/24/18 199 lb 9.6 oz (90.5 kg)    Labs: Lab Results  Component Value Date   NA 139 02/17/2018   K 4.4 02/17/2018   CL 107 02/17/2018   CO2 25 02/17/2018   GLUCOSE 98 02/17/2018   BUN 23 02/17/2018   CREATININE 0.90 02/17/2018   CALCIUM 9.5 02/17/2018   PHOS 3.9 04/19/2013   MG 1.9 09/16/2017   Lab Results  Component Value Date   INR 0.99 04/20/2018   Lab Results  Component Value Date   CHOL 200 07/26/2013   HDL 51.80 07/26/2013   LDLCALC 116 (H) 07/26/2013   TRIG 162.0 (H) 07/26/2013     GEN- The patient is well appearing, alert and oriented x 3 today.   Head- normocephalic, atraumatic Eyes-  Sclera clear, conjunctiva  pink Ears- hearing intact Oropharynx- clear Neck- supple, no JVP Lymph- no cervical lymphadenopathy Lungs- Clear to ausculation bilaterally, normal work of breathing Heart-  regular rate and rhythm, no murmurs, rubs or gallops, PMI not laterally displaced GI- soft, NT, ND, + BS Extremities- no clubbing, cyanosis, or edema MS- no significant deformity or atrophy Skin- no rash or lesion Psych- euthymic mood, full affect Neuro- strength and sensation are intact  EKG -v paced at 61 bpm  Epic records reviewed Interrogation of device today with normal functionating device, very low afib burden with only 3 mins of afib noted since previous interrogation in April  April.5 years of battery life left   Assessment and Plan: 1. Afib/flutter Pt reassured that her symptoms of intermittent lightheadedness, palpitation are not from afib, she is staying in rhythm confirmed  by interrogation Reduce  Amiodarone back to 200 mg daily Continue eliquis 5 mg bid for chadsvasc score of at least 4 IF symptoms continue discuss with oncologist/PCP/Dr. Caryl Comes   F/u as scheduled with Dr. Norma Fredrickson C. [place] [facility] [place],  [place] [address] [phone]

## 2018-04-24 ENCOUNTER — Ambulatory Visit (INDEPENDENT_AMBULATORY_CARE_PROVIDER_SITE_OTHER): Payer: Medicare Other | Admitting: *Deleted

## 2018-04-24 DIAGNOSIS — I442 Atrioventricular block, complete: Secondary | ICD-10-CM

## 2018-04-27 NOTE — Progress Notes (Signed)
Remote pacemaker transmission.   

## 2018-05-01 ENCOUNTER — Other Ambulatory Visit: Payer: Self-pay | Admitting: Orthopedic Surgery

## 2018-05-04 NOTE — Pre-Procedure Instructions (Signed)
Yanely Mast Straw  05/04/2018      [facility] [address], [place] [address] Phone: [phone] Fax: [phone]  [place], [place] [facility] [address] [place] [address] [place] [address] Phone: [phone] Fax: [phone]    Your procedure is scheduled on --05/14/18.  Report to [facility] Admitting at 530 A.M.  Call this number if you have problems the morning of surgery:  [phone]   Remember:  Do not eat or drink after midnight.      Take these medicines the morning of surgery with A SIP OF WATER --AMIODARONE,SYNTHROID,OXYCODONE,ULTRAM    Do not wear jewelry, make-up or nail polish.  Do not wear lotions, powders, or perfumes, or deodorant.  Do not shave 48 hours prior to surgery.  Men may shave face and neck.  Do not bring valuables to the hospital.  [facility] is not responsible for any belongings or valuables.  Contacts, dentures or bridgework may not be worn into surgery.  Leave your suitcase in the car.  After surgery it may be brought to your room.  For patients admitted to the hospital, discharge time will be determined by your treatment team.  Patients discharged the day of surgery will not be allowed to drive home.   Name and phone number of your driver:   Do not take any aspirin,anti-inflammatories,vitamins,or herbal supplements 5-7 days prior to surgery. Special instructions:  [place] - Preparing for Surgery  Before surgery, you can play an important role.  Because skin is not sterile, your skin needs to be as free of germs as possible.  You can reduce the number of germs on you skin by washing with CHG (chlorahexidine gluconate) soap before surgery.  CHG is an antiseptic cleaner which kills germs and bonds with the skin to continue killing germs even after washing.  Oral Hygiene is also important in reducing the risk of  infection.  Remember to brush your teeth with your regular toothpaste the morning of surgery.  Please DO NOT use if you have an allergy to CHG or antibacterial soaps.  If your skin becomes reddened/irritated stop using the CHG and inform your nurse when you arrive at Short Stay.  Do not shave (including legs and underarms) for at least 48 hours prior to the first CHG shower.  You may shave your face.  Please follow these instructions carefully:   1.  Shower with CHG Soap the night before surgery and the morning of Surgery.  2.  If you choose to wash your hair, wash your hair first as usual with your normal shampoo.  3.  After you shampoo, rinse your hair and body thoroughly to remove the shampoo. 4.  Use CHG as you would any other liquid soap.  You can apply chg directly to the skin and wash gently with a      scrungie or washcloth.           5.  Apply the CHG Soap to your body ONLY FROM THE NECK DOWN.   Do not use on open wounds or open sores. Avoid contact with your eyes, ears, mouth and genitals (private parts).  Wash genitals (private parts) with your normal soap.  6.  Wash thoroughly, paying special attention to the area where your surgery will be performed.  7.  Thoroughly rinse your body with warm water from the neck  down.  8.  DO NOT shower/wash with your normal soap after using and rinsing off the CHG Soap.  9.  Pat yourself dry with a clean towel.            10.  Wear clean pajamas.            11.  Place clean sheets on your bed the night of your first shower and do not sleep with pets.  Day of Surgery  Do not apply any lotions/deoderants the morning of surgery.   Please wear clean clothes to the hospital/surgery center. Remember to brush your teeth with toothpaste.    Please read over the following fact sheets that you were given. MRSA Information

## 2018-05-05 ENCOUNTER — Encounter (HOSPITAL_COMMUNITY): Payer: Self-pay

## 2018-05-05 ENCOUNTER — Other Ambulatory Visit: Payer: Self-pay

## 2018-05-05 ENCOUNTER — Encounter (HOSPITAL_COMMUNITY)
Admission: RE | Admit: 2018-05-05 | Discharge: 2018-05-05 | Disposition: A | Discharge status: Home / Self Care | Payer: Medicare Other | Source: Ambulatory Visit | Attending: Orthopedic Surgery | Admitting: Orthopedic Surgery

## 2018-05-05 DIAGNOSIS — Z01812 Encounter for preprocedural laboratory examination: Secondary | ICD-10-CM | POA: Insufficient documentation

## 2018-05-05 HISTORY — DX: Unspecified rotator cuff tear or rupture of unspecified shoulder, not specified as traumatic: M75.100

## 2018-05-05 LAB — URINALYSIS, ROUTINE W REFLEX MICROSCOPIC
Bilirubin Urine: NEGATIVE
GLUCOSE, UA: NEGATIVE mg/dL
KETONES UR: NEGATIVE mg/dL
Nitrite: NEGATIVE
PROTEIN: NEGATIVE mg/dL
Specific Gravity, Urine: 1.02 (ref 1.005–1.030)
pH: 5 (ref 5.0–8.0)

## 2018-05-05 LAB — SURGICAL PCR SCREEN
MRSA, PCR: NEGATIVE
Staphylococcus aureus: NEGATIVE

## 2018-05-05 LAB — CBC WITH DIFFERENTIAL/PLATELET
ABS IMMATURE GRANULOCYTES: 0 10*3/uL (ref 0.0–0.1)
BASOS PCT: 2 %
Basophils Absolute: 0.1 10*3/uL (ref 0.0–0.1)
Eosinophils Absolute: 0.2 10*3/uL (ref 0.0–0.7)
Eosinophils Relative: 4 %
HEMATOCRIT: 40.6 % (ref 36.0–46.0)
HEMOGLOBIN: 12.2 g/dL (ref 12.0–15.0)
Immature Granulocytes: 0 %
LYMPHS ABS: 0.6 10*3/uL — AB (ref 0.7–4.0)
Lymphocytes Relative: 13 %
MCH: 29 pg (ref 26.0–34.0)
MCHC: 30 g/dL (ref 30.0–36.0)
MCV: 96.7 fL (ref 78.0–100.0)
MONO ABS: 0.6 10*3/uL (ref 0.1–1.0)
MONOS PCT: 14 %
NEUTROS ABS: 2.9 10*3/uL (ref 1.7–7.7)
Neutrophils Relative %: 67 %
PLATELETS: 168 10*3/uL (ref 150–400)
RBC: 4.2 MIL/uL (ref 3.87–5.11)
RDW: 14.7 % (ref 11.5–15.5)
WBC: 4.3 10*3/uL (ref 4.0–10.5)

## 2018-05-05 LAB — TYPE AND SCREEN
ABO/RH(D): O POS
ANTIBODY SCREEN: NEGATIVE

## 2018-05-05 LAB — COMPREHENSIVE METABOLIC PANEL
ALK PHOS: 76 U/L (ref 38–126)
ALT: 15 U/L (ref 0–44)
AST: 17 U/L (ref 15–41)
Albumin: 3.4 g/dL — ABNORMAL LOW (ref 3.5–5.0)
Anion gap: 8 (ref 5–15)
BILIRUBIN TOTAL: 0.7 mg/dL (ref 0.3–1.2)
BUN: 20 mg/dL (ref 8–23)
CALCIUM: 9.2 mg/dL (ref 8.9–10.3)
CO2: 25 mmol/L (ref 22–32)
CREATININE: 1.02 mg/dL — AB (ref 0.44–1.00)
Chloride: 107 mmol/L (ref 98–111)
GFR, EST NON AFRICAN AMERICAN: 53 mL/min — AB (ref >60)
Glucose, Bld: 85 mg/dL (ref 70–99)
Potassium: 4.3 mmol/L (ref 3.5–5.1)
Sodium: 140 mmol/L (ref 135–145)
Total Protein: 6.1 g/dL — ABNORMAL LOW (ref 6.5–8.1)

## 2018-05-05 NOTE — Progress Notes (Signed)
Myriam Jacobson from Dr. Bettina Gavia office made aware of the abnormal UA result.

## 2018-05-05 NOTE — Progress Notes (Signed)
PCP - Dr. [name]  Cardiologist - Dr. [name]- also manages her PM  Chest x-ray - 02/24/18 (E)  EKG - 04/23/18 (E)  Stress Test - 08/31/07 (CE)  ECHO - 10/03/17 (E)  Cardiac Cath - 2015- Negative  Sleep Study - Yes- Negative CPAP - None  LABS- 05/05/18: CBC w/D, CMP, T/S, UA, PCR 05/14/18: PT, PTT  ASA- Denies Eliquis- LD- 8/18   Anesthesia- Yes- cardiac history  Pt denies having chest pain, sob, or fever at this time. All instructions explained to the pt, with a verbal understanding of the material. Pt agrees to go over the instructions while at home for a better understanding. The opportunity to ask questions was provided.

## 2018-05-05 NOTE — Progress Notes (Addendum)
Kayla Harrison            05/04/2018                          [facility] [address], [place] [address] Phone: [phone] Fax: [phone]  [place], [place] [facility] [address] [place] [address] [place] [address] Phone: [phone] Fax: [phone]              Your procedure is scheduled on Thurs., Aug. 22, 2019 from 7:30AM-9:35AM            Report to [facility] Admitting Entrance "A" at 5:30AM            Call this number if you have problems the morning of surgery:            [phone]             Remember:            Do not eat or drink after midnight on Aug. 21st                        Take these medicines the morning of surgery with A SIP OF WATER:  Amiodarone (PACERONE) and Levothyroxine (SYNTHROID, LEVOTHROID)   If needed TraMADol (ULTRAM) for moderate pain or OxyCODONE-acetaminophen (PERCOCET/ROXICET) for severe pain      Follow your surgeon's instructions on when to stop Eliquis. Per, Dr. Caryl Comes, last dose will be 8/20  7 days before surgery (8/15), stop taking all Other Aspirin Products, Vitamins, Fish oils, and Herbal medications. Also stop all NSAIDS i.e. Advil, Ibuprofen, Motrin, Aleve, Anaprox, Naproxen, BC, Goody Powders, and all Supplements.             Do not wear jewelry, make-up or nail polish.            Do not wear lotions, powders, or perfumes, or deodorant.            Do not shave 48 hours prior to surgery.              Do not bring valuables to the hospital.            [facility] is not responsible for any belongings or valuables.  Contacts, dentures or bridgework may not be worn into surgery.  Leave your suitcase in the car.  After surgery it may be brought to your room.  For patients admitted to the hospital, discharge time will be determined by your treatment team.  Patients discharged the day of surgery  will not be allowed to drive home.   Special instructions:   [name] - Preparing for Surgery  Before surgery, you can play an important role.  Because skin is not sterile, your skin needs to be as free of germs as possible.  You can reduce the number of germs on you skin by washing with CHG (chlorahexidine gluconate) soap before surgery.  CHG is an antiseptic cleaner which kills germs and bonds with the skin to continue killing germs even after washing.  Oral Hygiene is also important in reducing the risk of infection.  Remember to brush your teeth with your regular toothpaste the morning of surgery.  Please DO NOT use if you have an allergy to CHG or antibacterial soaps.  If your skin becomes  reddened/irritated stop using the CHG and inform your nurse when you arrive at Short Stay.  Do not shave (including legs and underarms) for at least 48 hours prior to the first CHG shower.  You may shave your face.  Please follow these instructions carefully:             1.  Shower with CHG Soap the night before surgery and the morning of Surgery.            2.  If you choose to wash your hair, wash your hair first as usual with your normal shampoo.            3.  After you shampoo, rinse your hair and body thoroughly to remove the shampoo. 4.  Use CHG as you would any other liquid soap.  You can apply chg directly to the skin and wash gently with a      scrungie or washcloth.           5.  Apply the CHG Soap to your body ONLY FROM THE NECK DOWN.   Do not use on open wounds or open sores. Avoid contact with your eyes, ears, mouth and genitals (private parts).  Wash genitals (private parts) with your normal soap.            6.  Wash thoroughly, paying special attention to the area where your surgery will be performed.            7.  Thoroughly rinse your body with warm water from the neck down.            8.  DO NOT shower/wash with your normal soap after using and rinsing off the CHG Soap.             9.  Pat yourself dry with a clean towel.            10.  Wear clean pajamas.            11.  Place clean sheets on your bed the night of your first shower and do not sleep with pets.  Day of Surgery  Do not apply any lotions/deoderants the morning of surgery.   Please wear clean clothes to the hospital/surgery center. Remember to brush your teeth with toothpaste.  Please read over the following fact sheets that you were given.

## 2018-05-06 NOTE — Progress Notes (Addendum)
Anesthesia Chart Review:  Case:  [id number] Date/Time:  05/14/18 0715   Procedure:  RIGHT REVERSE SHOULDER ARTHROPLASTY (Right )   Anesthesia type:  Choice   Pre-op diagnosis:      RIGHT SHOULDER OSTEOARTHRITIS AND ROTATOR CUFF TEAR     M19.11, M75.121   Location:  [facility] ROOM 07 / [facility]   Surgeon:  Tania Ade, MD      DISCUSSION: 72 yo female never smoker for above procedure. Pertinent hx includes Afib s/p multiple ablations (pulmonary vein isolation at [facility] and had recurrent atrial fibrillation and underwent convergent ablation at [facility] 2/68; it was complicated by heart block prompting pacemaker implantation), Elevated transaminases ("nonspecific"), Brucellosis as a teen, Stroke x 2 (2003), Chronic pleural effusions, DOE, Non-Hodgkin's lymphoma s/p chemoradiation, Hiatal hernia, Anemia, Dysphagia s/p radiation, Hypothyroid s/p thyroidectomy for CA, Breast CA.  Recently had successful cardioversion for Afib/flutter on 01/14/2018.  She has cardiac clearance from Dr. [name] telephone encounter 01/01/2018 stating "She is cleared for arthoplasty  The issue will be anticoagulation".   Subsequent telephone encounter 01/01/2018 by [facility] states "Per office protocol, patient can hold Eliquis for 24 hours prior to procedure.  Given history of stroke would recommend resume Eliquis as soon as safe post -procedure."  Pacemaker orders on chart per Dr. Caryl Comes.  Anticipate she can proceed as scheduled barring acute status change.  VS: BP 121/65   Pulse 84   Temp 36.6 C   Resp 18   Ht '5\' 6"'$  (1.676 m)   Wt 91.4 kg   SpO2 99%   BMI 32.54 kg/m   PROVIDERS: [name], MD is PCP  Nicholas Lose, MD is Oncologist last seen 02/19/2018  [name], MD is Cardiologist last seen 12/15/2017. Pt was seen in Afib clinic 04/23/2018 by [name], NP and was in sinus rhythm at that time.  LABS: Labs reviewed: Acceptable for surgery. Abn urine has been called to Dr. Bettina Gavia  office. (all labs ordered are listed, but only abnormal results are displayed)  Labs Reviewed  CBC WITH DIFFERENTIAL/PLATELET - Abnormal; Notable for the following components:      Result Value   Lymphs Abs 0.6 (*)    All other components within normal limits  COMPREHENSIVE METABOLIC PANEL - Abnormal; Notable for the following components:   Creatinine, Ser 1.02 (*)    Total Protein 6.1 (*)    Albumin 3.4 (*)    GFR calc non Af Amer 53 (*)    All other components within normal limits  URINALYSIS, ROUTINE W REFLEX MICROSCOPIC - Abnormal; Notable for the following components:   Hgb urine dipstick MODERATE (*)    Leukocytes, UA SMALL (*)    Bacteria, UA RARE (*)    All other components within normal limits  SURGICAL PCR SCREEN  TYPE AND SCREEN     IMAGES: CHEST - 2 VIEW 02/24/2018  COMPARISON:  10/06/2017  Correlation: Interval CT chest 02/17/2018  FINDINGS: RIGHT jugular Port-A-Cath with tip projecting over SVC.  LEFT subclavian sequential pacemaker with leads projecting at RIGHT atrium and RIGHT ventricle.  Prior LEFT atrial appendage clipping.  Normal heart size, mediastinal contours, and pulmonary vascularity.  Emphysematous and bronchitic changes.  Radiation fibrosis changes in the anteromedial RIGHT upper lobe again identified.  No acute infiltrate, pleural effusion or pneumothorax.  Bones unremarkable.  Surgical clips and the inferior cervical region bilaterally, by history thyroidectomy 1998.  IMPRESSION: Emphysematous changes with post radiation fibrosis changes at medial RIGHT upper lobe.  No acute abnormalities.   EKG: 04/23/2018: Normal sinus rhythm. Ventricular-paced rhythm. When compared with ECG of 01/14/18 atrial paicing spikes are no longer present  CV: TEE 10/03/2017: Study Conclusions  - Left ventricle: The cavity size was normal. Wall thickness was   normal. Systolic function was normal. The estimated ejection   fraction  was in the range of 55% to 60%. Wall motion was normal;   there were no regional wall motion abnormalities. - Mitral valve: Mildly calcified annulus. Mildly thickened, mildly   calcified leaflets . No evidence of vegetation. - Left atrium: The atrium was severely dilated. No spontaneous echo   contrast was observed. Emptying velocity was severely reduced. - Right atrium: The atrium was mildly dilated. - Atrial septum: No defect or patent foramen ovale was identified. - Tricuspid valve: There was mild-moderate regurgitation directed   centrally. - Pulmonary arteries: Systolic pressure was mildly increased. PA   peak pressure: 43 mm Hg (S).  Past Medical History:  Diagnosis Date  . Anemia   . Arthritis    osteoarthritis - knees and right shoulder  . Atrial fibrillation [facility])    ablation- 2x's-- 1st time- [facility], 2nd event at [facility] in 2008. Convergent ablation at [facility] 6/14  . Atrial fibrillation ([place])   . Blood transfusion without reported diagnosis   . Breast cancer ([place])    Dr Margot Chimes, total thyroidectomy- 1999- for cancer  . Chronic bilateral pleural effusions   . Colon polyp    Dr Earlean Shawl  . Complete heart block ([place])   . Complication of anesthesia    Ketamine produces LSD reaction, bright colored nightmarish experience   . Dyslipidemia   . Dyspnea   . H/O pleural effusion    s/p thoracentesis w 322m withdrawn  . Hepatitis    [place]   . History of dysphagia    due to radiation therapy  . History of hiatal hernia    small noted on PET scan  . History of kidney stones   . Hx of thyroid cancer    Dr SForde Dandy . Hyperlipidemia   . Hypothyroidism   . Lung cancer, [place] lobe [place] 01[place]   radiation RX completed 03/04/17; will start chemo 6/27, pt unaware of lung cancer  . Morbid [place]    Status post lap band surgery  . Non Hodgkin's lymphoma ([place]    on chemotherapy  . Persistent atrial fibrillation ([place]    a.  s/p PVI 2008 b. s/p convergent ablation 26553complicated by bradycardia requiring pacemaker implant  . Presence of permanent cardiac pacemaker   . Rotator cuff tear    Right  . Sinus node dysfunction ([place]    Complicating convergent ablation 6/14  . [place]    [place]  . SVC syndrome     Past Surgical History:  Procedure Laterality Date  . ABDOMINAL HYSTERECTOMY  1983  . afib ablation     a. 2008 PVI b. 2014 convergent ablation  . APPENDECTOMY    . BONE MARROW BIOPSY  02/21/2017  . BREAST LUMPECTOMY  2010  . bso  1998  . CARDIAC CATHETERIZATION     2015- negative  . CARDIOVERSION  10/09/2012   Procedure: CARDIOVERSION;  Surgeon: JMinus Breeding MD;  [place]  Service: Cardiovascular;  Laterality: N/A;  . CARDIOVERSION  10/09/2012   Procedure: CARDIOVERSION;  Surgeon: JMinus Breeding MD;  [place]  Service: Cardiovascular;  Laterality: N/A;  [name] gave the ok to add pt to the add on , but we must check to find out if the can add pt on at 1400 ( [phone])  . CARDIOVERSION N/A 11/20/2012   Procedure: CARDIOVERSION;  Surgeon: Fay Records, MD;  Location: [facility] ENDOSCOPY;  Service: Cardiovascular;  Laterality: N/A;  . CARDIOVERSION N/A 07/18/2017   Procedure: CARDIOVERSION;  Surgeon: Thayer Headings, MD;  Location: [facility] ENDOSCOPY;  Service: Cardiovascular;  Laterality: N/A;  . CARDIOVERSION N/A 10/03/2017   Procedure: CARDIOVERSION;  Surgeon: Sanda Klein, MD;  Location: [facility] ENDOSCOPY;  Service: Cardiovascular;  Laterality: N/A;  . CARDIOVERSION N/A 01/07/2018   Procedure: CARDIOVERSION;  Surgeon: Acie Fredrickson Wonda Cheng, MD;  Location: [place];  Service: Cardiovascular;  Laterality: N/A;  . CHOLECYSTECTOMY    . COLONOSCOPY W/ POLYPECTOMY     Dr Earlean Shawl  . CYSTOSCOPY N/A 02/06/2015   Procedure: CYSTOSCOPY;  Surgeon: Kathie Rhodes, MD;  [facility];  Service: Urology;  Laterality: N/A;  . CYSTOSCOPY W/ RETROGRADES Left 11/17/2017   Procedure: CYSTOSCOPY  WITH RETROGRADE /PYELOGRAM/;  Surgeon: Kathie Rhodes, MD;  Location: [facility];  Service: Urology;  Laterality: Left;  . CYSTOSCOPY WITH RETROGRADE PYELOGRAM, URETEROSCOPY AND STENT PLACEMENT Right 02/06/2015   Procedure: RETROGRADE PYELOGRAM, RIGHT URETEROSCOPY STENT PLACEMENT;  Surgeon: Kathie Rhodes, MD;  Location[facility];  Service: Urology;  Laterality: Right;  . CYSTOSCOPY WITH RETROGRADE PYELOGRAM, URETEROSCOPY AND STENT PLACEMENT Right 03/07/2017   Procedure: CYSTOSCOPY WITH RIGHT RETROGRADE PYELOGRAM,RIGHT  URETEROSCOPYLASER LITHOTRIPSY  AND STENT PLACEMENT AND STONE BASKETRY;  Surgeon: Kathie Rhodes, MD;  Location: [place];  Service: Urology;  Laterality: Right;  . EYE SURGERY     cataract surgery  . HOLMIUM LASER APPLICATION N/A 1/77/1165   Procedure: HOLMIUM LASER APPLICATION;  Surgeon: Kathie Rhodes, MD;  Location[facility];  Service: Urology;  Laterality: N/A;  . HOLMIUM LASER APPLICATION Right 7/90/3833   Procedure: HOLMIUM LASER APPLICATION;  Surgeon: Kathie Rhodes, MD;  Location: [facility];  Service: Urology;  Laterality: Right;  . HOLMIUM LASER APPLICATION Left 3/83/2919   Procedure: HOLMIUM LASER APPLICATION;  Surgeon: Kathie Rhodes, MD;  [facility];  Service: Urology;  Laterality: Left;  . IR FLUORO GUIDE PORT INSERTION RIGHT  02/24/2017  . IR NEPHROSTOMY PLACEMENT RIGHT  11/17/2017  . IR PATIENT EVAL TECH 0-60 MINS  03/11/2017  . IR REMOVAL TUN ACCESS W/ PORT W/O FL MOD SED  04/20/2018  . IR US GUIDE VASC ACCESS RIGHT  02/24/2017  . KNEE ARTHROSCOPY     bilateral  . LAPAROSCOPIC GASTRIC BANDING  07/10/2010  . LAPAROTOMY     for ruptured ovary and ovarian artery   . NEPHROLITHOTOMY Right 11/17/2017   Procedure: NEPHROLITHOTOMY PERCUTANEOUS;  Surgeon: Kathie Rhodes, MD;  [facility];  Service: Urology;  Laterality: Right;  . PACEMAKER INSERTION  03/10/2013   MDT dual chamber PPM  . POCKET REVISION N/A 12/08/2013   Procedure: POCKET REVISION;   Surgeon: Deboraha Sprang, MD;  Location: [facility] CATH LAB;  Service: Cardiovascular;  Laterality: N/A;  . PORTA CATH INSERTION    . TEE WITH CARDIOVERSION  09/22/2017  . TEE WITHOUT CARDIOVERSION N/A 10/03/2017   Procedure: TRANSESOPHAGEAL ECHOCARDIOGRAM (TEE);  Surgeon: Sanda Klein, MD;  Location: [place];  Service: Cardiovascular;  Laterality: N/A;  . THYROIDECTOMY  1998   Dr Margot Chimes  . TONSILLECTOMY    . TOTAL KNEE ARTHROPLASTY  04/13/2012   Procedure: TOTAL KNEE ARTHROPLASTY;  Surgeon: Rudean Haskell, MD;  Location: [place];  Service: Orthopedics;  Laterality: Right;  Marland Kitchen VIDEO BRONCHOSCOPY WITH ENDOBRONCHIAL ULTRASOUND N/A 02/07/2017   Procedure: VIDEO BRONCHOSCOPY WITH ENDOBRONCHIAL ULTRASOUND;  Surgeon: Marshell Garfinkel, MD;  Location: [place];  Service: Pulmonary;  Laterality: N/A;    MEDICATIONS: . amiodarone (PACERONE) 200 MG tablet  . ELIQUIS 5 MG TABS tablet  . furosemide (LASIX) 20 MG tablet  . levothyroxine (SYNTHROID, LEVOTHROID) 175 MCG tablet  . Lifitegrast (XIIDRA) 5 % SOLN  . Multiple Vitamin (MULTIVITAMIN) tablet  . oxyCODONE-acetaminophen (PERCOCET/ROXICET) 5-325 MG tablet  . rosuvastatin (CRESTOR) 10 MG tablet  . traMADol (ULTRAM) 50 MG tablet   No current facility-administered medications for this encounter.      Wynonia Musty [facility]/Anesthesiology Phone [phone] 05/06/2018 4:20 PM

## 2018-05-13 ENCOUNTER — Inpatient Hospital Stay: Admission: RE | Admit: 2018-05-13 | Payer: Medicare Other | Source: Ambulatory Visit

## 2018-05-13 MED ORDER — CEFAZOLIN SODIUM-DEXTROSE 2-4 GM/100ML-% IV SOLN
2.0000 g | INTRAVENOUS | Status: AC
  Administered 2018-05-14: 2 g via INTRAVENOUS
  Filled 2018-05-13: qty 100

## 2018-05-13 MED ORDER — TRANEXAMIC ACID 1000 MG/10ML IV SOLN
1000.0000 mg | INTRAVENOUS | Status: AC
  Administered 2018-05-14: 1000 mg via INTRAVENOUS
  Filled 2018-05-13: qty 1000

## 2018-05-14 ENCOUNTER — Inpatient Hospital Stay (HOSPITAL_COMMUNITY): Payer: Medicare Other

## 2018-05-14 ENCOUNTER — Inpatient Hospital Stay (HOSPITAL_COMMUNITY): Payer: Medicare Other | Admitting: Anesthesiology

## 2018-05-14 ENCOUNTER — Encounter (HOSPITAL_COMMUNITY): Payer: Self-pay | Admitting: *Deleted

## 2018-05-14 ENCOUNTER — Encounter (HOSPITAL_COMMUNITY)
Admission: RE | Disposition: A | Discharge status: Home / Self Care | Payer: Self-pay | Source: Ambulatory Visit | Attending: Obstetrics & Gynecology

## 2018-05-14 ENCOUNTER — Inpatient Hospital Stay (HOSPITAL_COMMUNITY): Payer: Medicare Other | Admitting: Physician Assistant

## 2018-05-14 ENCOUNTER — Inpatient Hospital Stay (HOSPITAL_COMMUNITY)
Admission: RE | Admit: 2018-05-14 | Discharge: 2018-05-15 | DRG: 483 | Disposition: A | Discharge status: Home / Self Care | Payer: Medicare Other | Source: Ambulatory Visit | Attending: Obstetrics & Gynecology | Admitting: Obstetrics & Gynecology

## 2018-05-14 DIAGNOSIS — M75121 Complete rotator cuff tear or rupture of right shoulder, not specified as traumatic: Secondary | ICD-10-CM | POA: Diagnosis not present

## 2018-05-14 DIAGNOSIS — Z96651 Presence of right artificial knee joint: Secondary | ICD-10-CM | POA: Diagnosis present

## 2018-05-14 DIAGNOSIS — Y842 Radiological procedure and radiotherapy as the cause of abnormal reaction of the patient, or of later complication, without mention of misadventure at the time of the procedure: Secondary | ICD-10-CM | POA: Diagnosis present

## 2018-05-14 DIAGNOSIS — I495 Sick sinus syndrome: Secondary | ICD-10-CM | POA: Diagnosis present

## 2018-05-14 DIAGNOSIS — Z7901 Long term (current) use of anticoagulants: Secondary | ICD-10-CM

## 2018-05-14 DIAGNOSIS — M19011 Primary osteoarthritis, right shoulder: Secondary | ICD-10-CM | POA: Diagnosis not present

## 2018-05-14 DIAGNOSIS — Z9884 Bariatric surgery status: Secondary | ICD-10-CM | POA: Diagnosis not present

## 2018-05-14 DIAGNOSIS — Z85118 Personal history of other malignant neoplasm of bronchus and lung: Secondary | ICD-10-CM

## 2018-05-14 DIAGNOSIS — K449 Diaphragmatic hernia without obstruction or gangrene: Secondary | ICD-10-CM | POA: Diagnosis present

## 2018-05-14 DIAGNOSIS — M19111 Post-traumatic osteoarthritis, right shoulder: Secondary | ICD-10-CM | POA: Diagnosis not present

## 2018-05-14 DIAGNOSIS — M25511 Pain in right shoulder: Secondary | ICD-10-CM | POA: Diagnosis not present

## 2018-05-14 DIAGNOSIS — Z79890 Hormone replacement therapy (postmenopausal): Secondary | ICD-10-CM

## 2018-05-14 DIAGNOSIS — I481 Persistent atrial fibrillation: Secondary | ICD-10-CM | POA: Diagnosis not present

## 2018-05-14 DIAGNOSIS — Z8249 Family history of ischemic heart disease and other diseases of the circulatory system: Secondary | ICD-10-CM | POA: Diagnosis not present

## 2018-05-14 DIAGNOSIS — Z9049 Acquired absence of other specified parts of digestive tract: Secondary | ICD-10-CM

## 2018-05-14 DIAGNOSIS — Z85850 Personal history of malignant neoplasm of thyroid: Secondary | ICD-10-CM

## 2018-05-14 DIAGNOSIS — Z8572 Personal history of non-Hodgkin lymphomas: Secondary | ICD-10-CM | POA: Diagnosis not present

## 2018-05-14 DIAGNOSIS — Z800 Family history of malignant neoplasm of digestive organs: Secondary | ICD-10-CM

## 2018-05-14 DIAGNOSIS — R1310 Dysphagia, unspecified: Secondary | ICD-10-CM | POA: Diagnosis not present

## 2018-05-14 DIAGNOSIS — Z950 Presence of cardiac pacemaker: Secondary | ICD-10-CM | POA: Diagnosis not present

## 2018-05-14 DIAGNOSIS — Z90710 Acquired absence of both cervix and uterus: Secondary | ICD-10-CM | POA: Diagnosis not present

## 2018-05-14 DIAGNOSIS — Z86010 Personal history of colonic polyps: Secondary | ICD-10-CM

## 2018-05-14 DIAGNOSIS — Z87442 Personal history of urinary calculi: Secondary | ICD-10-CM | POA: Diagnosis not present

## 2018-05-14 DIAGNOSIS — Z888 Allergy status to other drugs, medicaments and biological substances status: Secondary | ICD-10-CM

## 2018-05-14 DIAGNOSIS — I442 Atrioventricular block, complete: Secondary | ICD-10-CM | POA: Diagnosis present

## 2018-05-14 DIAGNOSIS — G8918 Other acute postprocedural pain: Secondary | ICD-10-CM | POA: Diagnosis not present

## 2018-05-14 DIAGNOSIS — Z8673 Personal history of transient ischemic attack (TIA), and cerebral infarction without residual deficits: Secondary | ICD-10-CM

## 2018-05-14 DIAGNOSIS — E785 Hyperlipidemia, unspecified: Secondary | ICD-10-CM | POA: Diagnosis present

## 2018-05-14 DIAGNOSIS — E890 Postprocedural hypothyroidism: Secondary | ICD-10-CM | POA: Diagnosis present

## 2018-05-14 DIAGNOSIS — Z853 Personal history of malignant neoplasm of breast: Secondary | ICD-10-CM | POA: Diagnosis not present

## 2018-05-14 DIAGNOSIS — Z833 Family history of diabetes mellitus: Secondary | ICD-10-CM

## 2018-05-14 DIAGNOSIS — Z96611 Presence of right artificial shoulder joint: Secondary | ICD-10-CM

## 2018-05-14 HISTORY — PX: OTHER SURGICAL HISTORY: SHX169

## 2018-05-14 HISTORY — PX: REVERSE SHOULDER ARTHROPLASTY: SHX5054

## 2018-05-14 LAB — PROTIME-INR
INR: 1.07
PROTHROMBIN TIME: 13.8 s (ref 11.4–15.2)

## 2018-05-14 LAB — APTT: aPTT: 34 seconds (ref 24–36)

## 2018-05-14 SURGERY — ARTHROPLASTY, SHOULDER, TOTAL, REVERSE
Anesthesia: General | Site: Shoulder | Laterality: Right

## 2018-05-14 MED ORDER — SODIUM CHLORIDE 0.9 % IR SOLN
Status: DC | PRN
  Administered 2018-05-14: 1000 mL

## 2018-05-14 MED ORDER — ONDANSETRON HCL 4 MG PO TABS: 4.0000 mg | ORAL_TABLET | Freq: Four times a day (QID) | ORAL | Status: DC | PRN

## 2018-05-14 MED ORDER — SODIUM CHLORIDE 0.9 % IV SOLN
INTRAVENOUS | Status: DC | PRN
  Administered 2018-05-14: 25 ug/min via INTRAVENOUS

## 2018-05-14 MED ORDER — ONDANSETRON HCL 4 MG/2ML IJ SOLN: 4.0000 mg | Freq: Four times a day (QID) | INTRAMUSCULAR | Status: DC | PRN

## 2018-05-14 MED ORDER — ONDANSETRON HCL 4 MG/2ML IJ SOLN
INTRAMUSCULAR | Status: DC | PRN
  Administered 2018-05-14: 4 mg via INTRAVENOUS

## 2018-05-14 MED ORDER — BUPIVACAINE LIPOSOME 1.3 % IJ SUSP
INTRAMUSCULAR | Status: DC | PRN
  Administered 2018-05-14: 10 mL via PERINEURAL

## 2018-05-14 MED ORDER — HYDROMORPHONE HCL 1 MG/ML IJ SOLN: 0.5000 mg | INTRAMUSCULAR | Status: DC | PRN

## 2018-05-14 MED ORDER — OXYCODONE HCL 5 MG PO TABS: 10.0000 mg | ORAL_TABLET | ORAL | Status: DC | PRN

## 2018-05-14 MED ORDER — ALUM & MAG HYDROXIDE-SIMETH 200-200-20 MG/5ML PO SUSP: 30.0000 mL | ORAL | Status: DC | PRN

## 2018-05-14 MED ORDER — ZOLPIDEM TARTRATE 5 MG PO TABS
5.0000 mg | ORAL_TABLET | Freq: Every evening | ORAL | Status: DC | PRN
  Administered 2018-05-14: 5 mg via ORAL
  Filled 2018-05-14: qty 1

## 2018-05-14 MED ORDER — METHOCARBAMOL 500 MG PO TABS
500.0000 mg | ORAL_TABLET | Freq: Four times a day (QID) | ORAL | Status: DC | PRN
  Administered 2018-05-14: 500 mg via ORAL
  Filled 2018-05-14: qty 1

## 2018-05-14 MED ORDER — DEXAMETHASONE SODIUM PHOSPHATE 10 MG/ML IJ SOLN
INTRAMUSCULAR | Status: AC
  Filled 2018-05-14: qty 1

## 2018-05-14 MED ORDER — LIDOCAINE 2% (20 MG/ML) 5 ML SYRINGE
INTRAMUSCULAR | Status: DC | PRN
  Administered 2018-05-14: 60 mg via INTRAVENOUS

## 2018-05-14 MED ORDER — AMIODARONE HCL 100 MG PO TABS
200.0000 mg | ORAL_TABLET | Freq: Every day | ORAL | Status: DC
  Administered 2018-05-15: 200 mg via ORAL
  Filled 2018-05-14 (×2): qty 2

## 2018-05-14 MED ORDER — BISACODYL 5 MG PO TBEC: 5.0000 mg | DELAYED_RELEASE_TABLET | Freq: Every day | ORAL | Status: DC | PRN

## 2018-05-14 MED ORDER — CEFAZOLIN SODIUM-DEXTROSE 2-4 GM/100ML-% IV SOLN
2.0000 g | Freq: Four times a day (QID) | INTRAVENOUS | Status: AC
  Administered 2018-05-14 – 2018-05-15 (×3): 2 g via INTRAVENOUS
  Filled 2018-05-14 (×3): qty 100

## 2018-05-14 MED ORDER — SUGAMMADEX SODIUM 200 MG/2ML IV SOLN
INTRAVENOUS | Status: DC | PRN
  Administered 2018-05-14: 200 mg via INTRAVENOUS

## 2018-05-14 MED ORDER — ACETAMINOPHEN 500 MG PO TABS
1000.0000 mg | ORAL_TABLET | Freq: Four times a day (QID) | ORAL | Status: AC
  Administered 2018-05-14 – 2018-05-15 (×3): 1000 mg via ORAL
  Filled 2018-05-14 (×4): qty 2

## 2018-05-14 MED ORDER — ROSUVASTATIN CALCIUM 10 MG PO TABS: 10.0000 mg | ORAL_TABLET | ORAL | Status: DC

## 2018-05-14 MED ORDER — OXYCODONE-ACETAMINOPHEN 5-325 MG PO TABS: 1.0000 | ORAL_TABLET | ORAL | 0 refills | Status: DC | PRN

## 2018-05-14 MED ORDER — FENTANYL CITRATE (PF) 100 MCG/2ML IJ SOLN: 25.0000 ug | INTRAMUSCULAR | Status: DC | PRN

## 2018-05-14 MED ORDER — ACETAMINOPHEN 325 MG PO TABS: 325.0000 mg | ORAL_TABLET | Freq: Four times a day (QID) | ORAL | Status: DC | PRN

## 2018-05-14 MED ORDER — ROCURONIUM BROMIDE 50 MG/5ML IV SOSY
PREFILLED_SYRINGE | INTRAVENOUS | Status: AC
  Filled 2018-05-14: qty 5

## 2018-05-14 MED ORDER — PHENOL 1.4 % MT LIQD: 1.0000 | OROMUCOSAL | Status: DC | PRN

## 2018-05-14 MED ORDER — FENTANYL CITRATE (PF) 250 MCG/5ML IJ SOLN
INTRAMUSCULAR | Status: AC
  Filled 2018-05-14: qty 5

## 2018-05-14 MED ORDER — PROPOFOL 10 MG/ML IV BOLUS
INTRAVENOUS | Status: DC | PRN
  Administered 2018-05-14: 110 mg via INTRAVENOUS

## 2018-05-14 MED ORDER — OXYCODONE HCL 5 MG/5ML PO SOLN: 5.0000 mg | Freq: Once | ORAL | Status: DC | PRN

## 2018-05-14 MED ORDER — OXYCODONE HCL 5 MG PO TABS
5.0000 mg | ORAL_TABLET | ORAL | Status: DC | PRN
  Administered 2018-05-14: 10 mg via ORAL
  Filled 2018-05-14 (×2): qty 2

## 2018-05-14 MED ORDER — MENTHOL 3 MG MT LOZG: 1.0000 | LOZENGE | OROMUCOSAL | Status: DC | PRN

## 2018-05-14 MED ORDER — METOCLOPRAMIDE HCL 5 MG PO TABS: 5.0000 mg | ORAL_TABLET | Freq: Three times a day (TID) | ORAL | Status: DC | PRN

## 2018-05-14 MED ORDER — METOCLOPRAMIDE HCL 5 MG/ML IJ SOLN: 5.0000 mg | Freq: Three times a day (TID) | INTRAMUSCULAR | Status: DC | PRN

## 2018-05-14 MED ORDER — POVIDONE-IODINE 7.5 % EX SOLN
Freq: Once | CUTANEOUS | Status: DC
  Filled 2018-05-14: qty 118

## 2018-05-14 MED ORDER — DIPHENHYDRAMINE HCL 12.5 MG/5ML PO ELIX: 12.5000 mg | ORAL_SOLUTION | ORAL | Status: DC | PRN

## 2018-05-14 MED ORDER — DEXAMETHASONE SODIUM PHOSPHATE 10 MG/ML IJ SOLN
INTRAMUSCULAR | Status: DC | PRN
  Administered 2018-05-14: 5 mg via INTRAVENOUS

## 2018-05-14 MED ORDER — BUPIVACAINE HCL (PF) 0.5 % IJ SOLN
INTRAMUSCULAR | Status: DC | PRN
  Administered 2018-05-14: 10 mL via PERINEURAL

## 2018-05-14 MED ORDER — TRANEXAMIC ACID 1000 MG/10ML IV SOLN
2000.0000 mg | INTRAVENOUS | Status: AC
  Administered 2018-05-14: 2000 mg via TOPICAL
  Filled 2018-05-14: qty 20

## 2018-05-14 MED ORDER — FLEET ENEMA 7-19 GM/118ML RE ENEM: 1.0000 | ENEMA | Freq: Once | RECTAL | Status: DC | PRN

## 2018-05-14 MED ORDER — DOCUSATE SODIUM 100 MG PO CAPS
100.0000 mg | ORAL_CAPSULE | Freq: Two times a day (BID) | ORAL | Status: DC
  Administered 2018-05-14 – 2018-05-15 (×3): 100 mg via ORAL
  Filled 2018-05-14 (×3): qty 1

## 2018-05-14 MED ORDER — ROCURONIUM BROMIDE 50 MG/5ML IV SOSY
PREFILLED_SYRINGE | INTRAVENOUS | Status: DC | PRN
  Administered 2018-05-14: 40 mg via INTRAVENOUS

## 2018-05-14 MED ORDER — LEVOTHYROXINE SODIUM 75 MCG PO TABS: 175.0000 ug | ORAL_TABLET | ORAL | Status: DC

## 2018-05-14 MED ORDER — OXYCODONE HCL 5 MG PO TABS: 5.0000 mg | ORAL_TABLET | Freq: Once | ORAL | Status: DC | PRN

## 2018-05-14 MED ORDER — LACTATED RINGERS IV SOLN
INTRAVENOUS | Status: DC | PRN
  Administered 2018-05-14: 07:00:00 via INTRAVENOUS

## 2018-05-14 MED ORDER — APIXABAN 5 MG PO TABS
5.0000 mg | ORAL_TABLET | Freq: Two times a day (BID) | ORAL | Status: DC
  Administered 2018-05-15: 5 mg via ORAL
  Filled 2018-05-14 (×2): qty 1

## 2018-05-14 MED ORDER — POLYETHYLENE GLYCOL 3350 17 G PO PACK: 17.0000 g | PACK | Freq: Every day | ORAL | Status: DC | PRN

## 2018-05-14 MED ORDER — FENTANYL CITRATE (PF) 100 MCG/2ML IJ SOLN
INTRAMUSCULAR | Status: DC | PRN
  Administered 2018-05-14: 50 ug via INTRAVENOUS

## 2018-05-14 MED ORDER — METHOCARBAMOL 1000 MG/10ML IJ SOLN
500.0000 mg | Freq: Four times a day (QID) | INTRAVENOUS | Status: DC | PRN
  Filled 2018-05-14: qty 5

## 2018-05-14 MED ORDER — ONDANSETRON HCL 4 MG/2ML IJ SOLN
INTRAMUSCULAR | Status: AC
  Filled 2018-05-14: qty 2

## 2018-05-14 MED ORDER — SODIUM CHLORIDE 0.9 % IV SOLN
INTRAVENOUS | Status: DC
  Administered 2018-05-14: 11:00:00 via INTRAVENOUS

## 2018-05-14 MED ORDER — PROPOFOL 10 MG/ML IV BOLUS
INTRAVENOUS | Status: AC
  Filled 2018-05-14: qty 20

## 2018-05-14 SURGICAL SUPPLY — 77 items
BASEPLATE P2 COATD GLND 6.5X30 (Shoulder) ×1 IMPLANT
BIT DRILL 2.5 DIA 127 CALI (BIT) ×3 IMPLANT
BIT DRILL 4 DIA CALIBRATED (BIT) ×3 IMPLANT
BIT DRILL 5/64X5 DISP (BIT) IMPLANT
BLADE SAW SAG 73X25 THK (BLADE) ×2
BLADE SAW SGTL 73X25 THK (BLADE) ×1 IMPLANT
BLADE SURG 15 STRL LF DISP TIS (BLADE) IMPLANT
BLADE SURG 15 STRL SS (BLADE)
BOWL SMART MIX CTS (DISPOSABLE) IMPLANT
CHLORAPREP W/TINT 26ML (MISCELLANEOUS) ×3 IMPLANT
CLOSURE STERI-STRIP 1/2X4 (GAUZE/BANDAGES/DRESSINGS) ×1
CLOSURE WOUND 1/2 X4 (GAUZE/BANDAGES/DRESSINGS) ×1
CLSR STERI-STRIP ANTIMIC 1/2X4 (GAUZE/BANDAGES/DRESSINGS) ×2 IMPLANT
COVER MAYO STAND STRL (DRAPES) IMPLANT
COVER SURGICAL LIGHT HANDLE (MISCELLANEOUS) ×3 IMPLANT
DRAPE INCISE IOBAN 66X45 STRL (DRAPES) ×3 IMPLANT
DRAPE ORTHO SPLIT 77X108 STRL (DRAPES) ×4
DRAPE SURG ORHT 6 SPLT 77X108 (DRAPES) ×2 IMPLANT
DRSG AQUACEL AG ADV 3.5X 6 (GAUZE/BANDAGES/DRESSINGS) ×3 IMPLANT
DRSG AQUACEL AG ADV 3.5X10 (GAUZE/BANDAGES/DRESSINGS) ×3 IMPLANT
ELECT BLADE 4.0 EZ CLEAN MEGAD (MISCELLANEOUS) ×3
ELECT REM PT RETURN 9FT ADLT (ELECTROSURGICAL) ×3
ELECTRODE BLDE 4.0 EZ CLN MEGD (MISCELLANEOUS) ×1 IMPLANT
ELECTRODE REM PT RTRN 9FT ADLT (ELECTROSURGICAL) ×1 IMPLANT
EVACUATOR 1/8 PVC DRAIN (DRAIN) IMPLANT
GLOVE BIO SURGEON STRL SZ7 (GLOVE) ×3 IMPLANT
GLOVE BIO SURGEON STRL SZ7.5 (GLOVE) ×3 IMPLANT
GLOVE BIOGEL PI IND STRL 7.0 (GLOVE) ×1 IMPLANT
GLOVE BIOGEL PI IND STRL 8 (GLOVE) ×1 IMPLANT
GLOVE BIOGEL PI INDICATOR 7.0 (GLOVE) ×2
GLOVE BIOGEL PI INDICATOR 8 (GLOVE) ×2
GOWN STRL REUS W/ TWL LRG LVL3 (GOWN DISPOSABLE) ×3 IMPLANT
GOWN STRL REUS W/ TWL XL LVL3 (GOWN DISPOSABLE) ×1 IMPLANT
GOWN STRL REUS W/TWL LRG LVL3 (GOWN DISPOSABLE) ×6
GOWN STRL REUS W/TWL XL LVL3 (GOWN DISPOSABLE) ×2
HANDPIECE INTERPULSE COAX TIP (DISPOSABLE) ×2
HOOD PEEL AWAY FLYTE STAYCOOL (MISCELLANEOUS) ×6 IMPLANT
INSERT SMALL SOCKET 32MM NEU (Insert) ×3 IMPLANT
KIT BASIN OR (CUSTOM PROCEDURE TRAY) ×3 IMPLANT
KIT TURNOVER KIT B (KITS) ×3 IMPLANT
MANIFOLD NEPTUNE II (INSTRUMENTS) ×3 IMPLANT
NEEDLE MAYO TROCAR (NEEDLE) IMPLANT
NS IRRIG 1000ML POUR BTL (IV SOLUTION) ×3 IMPLANT
P2 COATDE GLNOID BSEPLT 6.5X30 (Shoulder) ×3 IMPLANT
PACK SHOULDER (CUSTOM PROCEDURE TRAY) ×3 IMPLANT
PAD ARMBOARD 7.5X6 YLW CONV (MISCELLANEOUS) ×6 IMPLANT
RESTRAINT HEAD UNIVERSAL NS (MISCELLANEOUS) ×3 IMPLANT
RETRIEVER SUT HEWSON (MISCELLANEOUS) IMPLANT
SCREW BONE LOCKING RSP 5.0X14 (Screw) ×3 IMPLANT
SCREW BONE LOCKING RSP 5.0X30 (Screw) ×6 IMPLANT
SCREW BONE RSP LOCK 5X14 (Screw) ×1 IMPLANT
SCREW BONE RSP LOCK 5X22 (Screw) ×1 IMPLANT
SCREW BONE RSP LOCK 5X30 (Screw) ×2 IMPLANT
SCREW BONE RSP LOCKING 5.0X32 (Screw) ×3 IMPLANT
SCREW RETAIN W/HEAD 4MM OFFSET (Shoulder) ×3 IMPLANT
SET HNDPC FAN SPRY TIP SCT (DISPOSABLE) ×1 IMPLANT
SLING ARM FOAM STRAP LRG (SOFTGOODS) ×3 IMPLANT
SLING ARM FOAM STRAP MED (SOFTGOODS) IMPLANT
SPONGE LAP 18X18 X RAY DECT (DISPOSABLE) IMPLANT
SPONGE LAP 4X18 RFD (DISPOSABLE) IMPLANT
STEM HUMERAL REV SHL 6X108 SM (Stem) ×3 IMPLANT
STRIP CLOSURE SKIN 1/2X4 (GAUZE/BANDAGES/DRESSINGS) ×2 IMPLANT
SUCTION FRAZIER HANDLE 10FR (MISCELLANEOUS) ×2
SUCTION TUBE FRAZIER 10FR DISP (MISCELLANEOUS) ×1 IMPLANT
SUPPORT WRAP ARM LG (MISCELLANEOUS) ×3 IMPLANT
SUT ETHIBOND NAB CT1 #1 30IN (SUTURE) ×3 IMPLANT
SUT FIBERWIRE #2 38 T-5 BLUE (SUTURE)
SUT MNCRL AB 4-0 PS2 18 (SUTURE) ×3 IMPLANT
SUT SILK 2 0 TIES 17X18 (SUTURE)
SUT SILK 2-0 18XBRD TIE BLK (SUTURE) IMPLANT
SUT VIC AB 2-0 CT1 27 (SUTURE) ×4
SUT VIC AB 2-0 CT1 TAPERPNT 27 (SUTURE) ×2 IMPLANT
SUTURE FIBERWR #2 38 T-5 BLUE (SUTURE) IMPLANT
SYR 30ML LL (SYRINGE) IMPLANT
TOWEL OR 17X26 10 PK STRL BLUE (TOWEL DISPOSABLE) ×3 IMPLANT
WATER STERILE IRR 1000ML POUR (IV SOLUTION) ×3 IMPLANT
YANKAUER SUCT BULB TIP NO VENT (SUCTIONS) IMPLANT

## 2018-05-14 NOTE — Op Note (Signed)
Procedure(s): RIGHT REVERSE SHOULDER ARTHROPLASTY Procedure Note  BRENDOLYN STOCKLEY female 72 y.o. 05/14/2018  Procedure(s) and Anesthesia Type:    * RIGHT REVERSE SHOULDER ARTHROPLASTY - Choice   Indications:  72 y.o. female  With endstage right shoulder arthritis with severe rotator cuff pathology. Pain and dysfunction interfered with quality of life and nonoperative treatment with activity modification, NSAIDS and injections failed.     Surgeon: Isabella Stalling   Assistants: Jeanmarie Hubert PA-C [facility] was present and scrubbed throughout the procedure and was essential in positioning, retraction, exposure, and closure)  Anesthesia: General endotracheal anesthesia with preoperative interscalene block given by the attending anesthesiologist    Procedure Detail  RIGHT REVERSE SHOULDER ARTHROPLASTY   Estimated Blood Loss:  200 mL         Drains: none  Blood Given: none          Specimens: none        Complications:  * No complications entered in OR log *         Disposition: PACU - hemodynamically stable.         Condition: stable      OPERATIVE FINDINGS:  A DJO Altivate pressfit reverse total shoulder arthroplasty was placed with a  size 6 stem, a 32-4 glenosphere, and a standard poly insert. The base plate  fixation was excellent.  PROCEDURE: The patient was identified in the preoperative holding area  where I personally marked the operative site after verifying site, side,  and procedure with the patient. An interscalene block given by  the attending anesthesiologist in the holding area and the patient was taken back to the operating room where all extremities were  carefully padded in position after general anesthesia was induced. She  was placed in a beach-chair position and the operative upper extremity was  prepped and draped in a standard sterile fashion. An approximately 10-  cm incision was made from the tip of the coracoid process to the  center  point of the humerus at the level of the axilla. Dissection was carried  down through subcutaneous tissues to the level of the cephalic vein  which was taken laterally with the deltoid. The pectoralis major was  retracted medially. The subdeltoid space was developed and the lateral  edge of the conjoined tendon was identified. The undersurface of  conjoined tendon was palpated and the musculocutaneous nerve was not in  the field. Retractor was placed underneath the conjoined and second  retractor was placed lateral into the deltoid. The circumflex humeral  artery and vessels were identified and clamped and coagulated. The  biceps tendon was tenotomized.  The subscapularis was taken down as a peel.  The  joint was then gently externally rotated while the capsule was released  from the humeral neck around to just beyond the 6 o'clock position. At  this point, the joint was dislocated and the humeral head was presented  into the wound. The excessive osteophyte formation was removed with a  large rongeur.  The cutting guide was used to make the appropriate  head cut and the head was saved for potentially bone grafting.  The glenoid was exposed with the arm in an  abducted extended position. The anterior and posterior labrum were  completely excised and the capsule was released circumferentially to  allow for exposure of the glenoid for preparation. The 2.5 mm drill was  placed using the guide in 5-10 inferior angulation and the tap was then advanced in the same  hole. Small and large reamers were then used. The tap was then removed and the Metaglene was then screwed in with excellent purchase.  The peripheral guide was then used to drilled measured and filled peripheral locking screws. The size 32-4 glenosphere was then impacted on the [facility] taper and the central screw was placed. The humerus was then again exposed and the diaphyseal reamers were used followed by the metaphyseal reamers. The  final broach was left in place in the proximal trial was placed. The joint was reduced and with this implant it was felt that soft tissue tensioning was appropriate with excellent stability and excellent range of motion. Therefore, final humeral stem was placed press-fit with bone grafting.  And then the trial polyethylene inserts were tested again and the above implant was felt to be the most appropriate for final insertion. The joint was reduced taken through full range of motion and felt to be stable. Soft tissue tension was appropriate.  The joint was then copiously irrigated with pulse  lavage and the wound was then closed. The subscapularis was repaired through bone tunnels with two #2 FiberWire sutures.  .   Skin was closed with 2-0 Vicryl in a deep dermal layer and 4-0  Monocryl for skin closure. Steri-Strips were applied. Sterile  dressings were then applied as well as a sling. The patient was allowed  to awaken from general anesthesia, transferred to stretcher, and taken  to recovery room in stable condition.   POSTOPERATIVE PLAN: The patient will be kept in the hospital postoperatively  for pain control and therapy. We will restart her Eliquis tomorrow

## 2018-05-14 NOTE — Anesthesia Procedure Notes (Signed)
Anesthesia Regional Block: Interscalene brachial plexus block   Pre-Anesthetic Checklist: ,, timeout performed, Correct Patient, Correct Site, Correct Laterality, Correct Procedure, Correct Position, site marked, Risks and benefits discussed,  Surgical consent,  Pre-op evaluation,  At surgeon's request and post-op pain management  Laterality: Right  Prep: chloraprep       Needles:  Injection technique: Single-shot  Needle Type: Echogenic Stimulator Needle     Needle Length: 5cm  Needle Gauge: 22     Additional Needles:   Procedures:, nerve stimulator,,,,,,,   Nerve Stimulator or Paresthesia:  Response: biceps flexion, 0.45 mA,   Additional Responses:   Narrative:  Start time: 05/14/2018 7:12 AM End time: 05/14/2018 7:20 AM Injection made incrementally with aspirations every 5 mL.  Performed by: Personally  Anesthesiologist: Albertha Ghee, MD  Additional Notes: Functioning IV was confirmed and monitors were applied.  A 41mm 22ga Arrow echogenic stimulator needle was used. Sterile prep and drape,hand hygiene and sterile gloves were used.  Negative aspiration and negative test dose prior to incremental administration of local anesthetic. The patient tolerated the procedure well.  Ultrasound guidance: relevent anatomy identified, needle position confirmed, local anesthetic spread visualized around nerve(s), vascular puncture avoided.  Image printed for medical record.

## 2018-05-14 NOTE — Discharge Instructions (Signed)
Discharge Instructions after Reverse Total Shoulder Arthroplasty   A sling has been provided for you. You are to wear this at all times, even while sleeping, until your first post operative visit with Dr. Tamera Punt. Use ice on the shoulder intermittently over the first 48 hours after surgery.  Pain medicine has been prescribed for you.  Use your medicine liberally over the first 48 hours, and then you can begin to taper your use. You may take Extra Strength Tylenol or Tylenol only in place of the pain pills. DO NOT take ANY nonsteroidal anti-inflammatory pain medications: Advil, Motrin, Ibuprofen, Aleve, Naproxen or Naprosyn.  Take one aspirin a day for 2 weeks after surgery, unless you have an aspirin sensitivity/allergy or asthma.  Leave your dressing on until your first follow up visit.  You may shower with the dressing.  Hold your arm as if you still have your sling on while you shower. Simply allow the water to wash over the site and then pat dry. Make sure your axilla (armpit) is completely dry after showering.    Please call [phone] during normal business hours or [phone] after hours for any problems. Including the following:  - excessive redness of the incisions - drainage for more than 4 days - fever of more than 101.5 F  *Please note that pain medications will not be refilled after hours or on weekends.      Information on my medicine - ELIQUIS (apixaban)  Why was Eliquis prescribed for you? Eliquis was prescribed for you to reduce the risk of a blood clot forming that can cause a stroke if you have a medical condition called atrial fibrillation (a type of irregular heartbeat).  What do You need to know about Eliquis ? Take your Eliquis TWICE DAILY - one tablet in the morning and one tablet in the evening with or without food. If you have difficulty swallowing the tablet whole please discuss with your pharmacist how to take the medication safely.  Take  Eliquis exactly as prescribed by your doctor and DO NOT stop taking Eliquis without talking to the doctor who prescribed the medication.  Stopping may increase your risk of developing a stroke.  Refill your prescription before you run out.  After discharge, you should have regular check-up appointments with your healthcare provider that is prescribing your Eliquis.  In the future your dose may need to be changed if your kidney function or weight changes by a significant amount or as you get older.  What do you do if you miss a dose? If you miss a dose, take it as soon as you remember on the same day and resume taking twice daily.  Do not take more than one dose of ELIQUIS at the same time to make up a missed dose.  Important Safety Information A possible side effect of Eliquis is bleeding. You should call your healthcare provider right away if you experience any of the following: ? Bleeding from an injury or your nose that does not stop. ? Unusual colored urine (red or dark brown) or unusual colored stools (red or black). ? Unusual bruising for unknown reasons. ? A serious fall or if you hit your head (even if there is no bleeding).  Some medicines may interact with Eliquis and might increase your risk of bleeding or clotting while on Eliquis. To help avoid this, consult your healthcare provider or pharmacist prior to using any new prescription or non-prescription medications, including herbals, vitamins, non-steroidal anti-inflammatory drugs (NSAIDs) and  supplements.  This website has more information on Eliquis (apixaban): http://www.eliquis.com/eliquis/home

## 2018-05-14 NOTE — Anesthesia Procedure Notes (Signed)
Procedure Name: Intubation Date/Time: 05/14/2018 7:37 AM Performed by: Kyung Rudd, CRNA Pre-anesthesia Checklist: Patient identified, Emergency Drugs available, Suction available, Patient being monitored and Timeout performed Patient Re-evaluated:Patient Re-evaluated prior to induction Oxygen Delivery Method: Circle system utilized Preoxygenation: Pre-oxygenation with 100% oxygen Induction Type: IV induction Ventilation: Mask ventilation without difficulty Laryngoscope Size: Mac and 3 Grade View: Grade I Tube type: Oral Tube size: 7.0 mm Number of attempts: 1 Airway Equipment and Method: Stylet Placement Confirmation: ETT inserted through vocal cords under direct vision,  positive ETCO2 and breath sounds checked- equal and bilateral Secured at: 21 cm Tube secured with: Tape Dental Injury: Teeth and Oropharynx as per pre-operative assessment

## 2018-05-14 NOTE — Anesthesia Postprocedure Evaluation (Signed)
Anesthesia Post Note  Patient: Kayla Harrison  Procedure(s) Performed: RIGHT REVERSE SHOULDER ARTHROPLASTY (Right Shoulder)     Patient location during evaluation: PACU Anesthesia Type: General and Regional Level of consciousness: awake and alert Pain management: pain level controlled Vital Signs Assessment: post-procedure vital signs reviewed and stable Respiratory status: spontaneous breathing, nonlabored ventilation, respiratory function stable and patient connected to nasal cannula oxygen Cardiovascular status: blood pressure returned to baseline and stable Postop Assessment: no apparent nausea or vomiting Anesthetic complications: no    Last Vitals:  Vitals:   05/14/18 0950 05/14/18 1011  BP: 134/76 (!) 141/72  Pulse: 69 70  Resp: 14 16  Temp: 36.5 C 36.5 C  SpO2: 97% 95%    Last Pain:  Vitals:   05/14/18 1011  TempSrc: Oral  PainSc:                  Oliver S

## 2018-05-14 NOTE — H&P (Signed)
Kayla Harrison is an 72 y.o. female.   Chief Complaint: R shoulder pain and dysfunction HPI: Endstage R shoulder arthritis with significant pain and dysfunction, failed conservative measures.  Pain interferes with sleep and quality of life.   Past Medical History:  Diagnosis Date  . Anemia   . Arthritis    osteoarthritis - knees and right shoulder  . Atrial fibrillation [facility])    ablation- 2x's-- 1st time- [facility], 2nd event at [facility] in 2008. Convergent ablation at [facility] 6/14  . Atrial fibrillation ([place])   . Blood transfusion without reported diagnosis   . Breast cancer ([place])    Dr Margot Chimes, total thyroidectomy- 1999- for cancer  . Chronic bilateral pleural effusions   . Colon polyp    Dr Earlean Shawl  . Complete heart block ([place])   . Complication of anesthesia    Ketamine produces LSD reaction, bright colored nightmarish experience   . Dyslipidemia   . Dyspnea   . H/O pleural effusion    s/p thoracentesis w 3273m withdrawn  . Hepatitis    [place] ?hepatitis   . History of dysphagia    due to radiation therapy  . History of hiatal hernia    small noted on PET scan  . History of kidney stones   . Hx of thyroid cancer    Dr SForde Dandy . Hyperlipidemia   . Hypothyroidism   . [place]   radiation RX completed 03/04/17; will start chemo 6/27, pt unaware of lung cancer  . Morbid obesity ([place]    Status post lap band surgery  . Non Hodgkin's lymphoma ([place]    on chemotherapy  . Persistent atrial fibrillation ([place]    a. s/p PVI 2008 b. s/p convergent ablation 29811complicated by bradycardia requiring pacemaker implant  . Presence of permanent cardiac pacemaker   . Rotator cuff tear    Right  . Sinus node dysfunction ([place]    Complicating convergent ablation 6/14  . Stroke ([facility]    [place]  . SVC syndrome     Past Surgical History:  Procedure Laterality Date  . ABDOMINAL HYSTERECTOMY  1983  . afib  ablation     a. 2008 PVI b. 2014 convergent ablation  . APPENDECTOMY    . BONE MARROW BIOPSY  02/21/2017  . BREAST LUMPECTOMY  2010  . bso  1998  . CARDIAC CATHETERIZATION     2015- negative  . CARDIOVERSION  10/09/2012   Procedure: CARDIOVERSION;  Surgeon: JMinus Breeding MD;  [place]  Service: Cardiovascular;  Laterality: N/A;  . CARDIOVERSION  10/09/2012   Procedure: CARDIOVERSION;  Surgeon: JMinus Breeding MD;  [facility];  Service: Cardiovascular;  Laterality: N/A;  [name] the ok to add pt to the add on , but we must check to find out if the can add pt on at [phone] ( [phone])  . CARDIOVERSION N/A 11/20/2012   Procedure: CARDIOVERSION;  Surgeon: PFay Records MD;  [facility];  Service: Cardiovascular;  Laterality: N/A;  . CARDIOVERSION N/A 07/18/2017   Procedure: CARDIOVERSION;  Surgeon: NThayer Headings MD;  [facility];  Service: Cardiovascular;  Laterality: N/A;  . CARDIOVERSION N/A 10/03/2017   Procedure: CARDIOVERSION;  Surgeon: CSanda Klein MD;  [facility];  Service: Cardiovascular;  Laterality: N/A;  . CARDIOVERSION N/A 01/07/2018   Procedure: CARDIOVERSION;  Surgeon: NAcie FredricksonPWonda Cheng MD;  [place]  Service:  Cardiovascular;  Laterality: N/A;  . CHOLECYSTECTOMY    . COLONOSCOPY W/ POLYPECTOMY     Dr Earlean Shawl  . CYSTOSCOPY N/A 02/06/2015   Procedure: CYSTOSCOPY;  Surgeon: Kathie Rhodes, MD;  [facility];  Service: Urology;  Laterality: N/A;  . CYSTOSCOPY W/ RETROGRADES Left 11/17/2017   Procedure: CYSTOSCOPY WITH RETROGRADE /PYELOGRAM/;  Surgeon: Kathie Rhodes, MD;  [facility];  Service: Urology;  Laterality: Left;  . CYSTOSCOPY WITH RETROGRADE PYELOGRAM, URETEROSCOPY AND STENT PLACEMENT Right 02/06/2015   Procedure: RETROGRADE PYELOGRAM, RIGHT URETEROSCOPY STENT PLACEMENT;  Surgeon: Kathie Rhodes, MD;  [facility];  Service: Urology;  Laterality: Right;  . CYSTOSCOPY WITH RETROGRADE PYELOGRAM,  URETEROSCOPY AND STENT PLACEMENT Right 03/07/2017   Procedure: CYSTOSCOPY WITH RIGHT RETROGRADE PYELOGRAM,RIGHT  URETEROSCOPYLASER LITHOTRIPSY  AND STENT PLACEMENT AND STONE BASKETRY;  Surgeon: Kathie Rhodes, MD;  Location: [place];  Service: Urology;  Laterality: Right;  . EYE SURGERY     cataract surgery  . HOLMIUM LASER APPLICATION N/A 02/09/8021   Procedure: HOLMIUM LASER APPLICATION;  Surgeon: Kathie Rhodes, MD;  [facility];  Service: Urology;  Laterality: N/A;  . HOLMIUM LASER APPLICATION Right 3/36/1224   Procedure: HOLMIUM LASER APPLICATION;  Surgeon: Kathie Rhodes, MD;  Location[facility];  Service: Urology;  Laterality: Right;  . HOLMIUM LASER APPLICATION Left 4/97/5300   Procedure: HOLMIUM LASER APPLICATION;  Surgeon: Kathie Rhodes, MD;  [facility];  Service: Urology;  Laterality: Left;  . IR FLUORO GUIDE PORT INSERTION RIGHT  02/24/2017  . IR NEPHROSTOMY PLACEMENT RIGHT  11/17/2017  . IR PATIENT EVAL TECH 0-60 MINS  03/11/2017  . IR REMOVAL TUN ACCESS W/ PORT W/O FL MOD SED  04/20/2018  . IR US GUIDE VASC ACCESS RIGHT  02/24/2017  . KNEE ARTHROSCOPY     bilateral  . LAPAROSCOPIC GASTRIC BANDING  07/10/2010  . LAPAROTOMY     for ruptured ovary and ovarian artery   . NEPHROLITHOTOMY Right 11/17/2017   Procedure: NEPHROLITHOTOMY PERCUTANEOUS;  Surgeon: Kathie Rhodes, MD;  [facility];  Service: Urology;  Laterality: Right;  . PACEMAKER INSERTION  03/10/2013   MDT dual chamber PPM  . POCKET REVISION N/A 12/08/2013   Procedure: POCKET REVISION;  Surgeon: Deboraha Sprang, MD;  [facility];  Service: Cardiovascular;  Laterality: N/A;  . PORTA CATH INSERTION    . TEE WITH CARDIOVERSION  09/22/2017  . TEE WITHOUT CARDIOVERSION N/A 10/03/2017   Procedure: TRANSESOPHAGEAL ECHOCARDIOGRAM (TEE);  Surgeon: Sanda Klein, MD;  Location: [place];  Service: Cardiovascular;  Laterality: N/A;  . THYROIDECTOMY  1998   Dr Margot Chimes  .  TONSILLECTOMY    . TOTAL KNEE ARTHROPLASTY  04/13/2012   Procedure: TOTAL KNEE ARTHROPLASTY;  Surgeon: Rudean Haskell, MD;  Location: [address];  Service: Orthopedics;  Laterality: Right;  Marland Kitchen VIDEO BRONCHOSCOPY WITH ENDOBRONCHIAL ULTRASOUND N/A 02/07/2017   Procedure: VIDEO BRONCHOSCOPY WITH ENDOBRONCHIAL ULTRASOUND;  Surgeon: Marshell Garfinkel, MD;  Location: [place];  Service: Pulmonary;  Laterality: N/A;    Family History  Problem Relation Age of Onset  . Heart disease Father 10       MI _0   . Colon cancer Father        COLON  . Heart attack Father   . Diabetes Sister   . Diabetes Brother   . Diabetes Paternal Aunt   . Diabetes Paternal Grandmother    Social History:  reports that she has never smoked. She has never used smokeless  tobacco. She reports that she does not drink alcohol or use drugs.  Allergies:  Allergies  Allergen Reactions  . Dofetilide Other (See Comments)    Prolonged QT interval   . Rivaroxaban Other (See Comments)    Nose Bleed X 6 hrs ; packing in ER Nasal hemorrhage  . Apixaban Other (See Comments)    "BLEEDING IN URINE"  . Epinephrine Rash and Other (See Comments)    Oral anesthetic Dental form only (liquid). Patient stated she will become "out of it" she can hear you but cannot respond in normal fashion. "not fully with it"  . Adhesive [Tape] Rash    Paper tape as well  . Ketamine Other (See Comments)    HALLUCINATIONS    Medications Prior to Admission  Medication Sig Dispense Refill  . amiodarone (PACERONE) 200 MG tablet Take 1 tablet (200 mg total) by mouth daily. 90 tablet 3  . ELIQUIS 5 MG TABS tablet TAKE 1 TABLET BY MOUTH TWO  TIMES DAILY 180 tablet 2  . levothyroxine (SYNTHROID, LEVOTHROID) 175 MCG tablet Take 175 mcg by mouth See admin instructions. TAKE 1 TABLET (175 MCG) BY MOUTH DAILY, EXCEPT FRIDAYS.    Marland Kitchen Lifitegrast (XIIDRA) 5 % SOLN Apply 1 drop to eye at bedtime.     . Multiple Vitamin (MULTIVITAMIN) tablet Take 1 tablet by mouth  daily.     . rosuvastatin (CRESTOR) 10 MG tablet Take 10 mg by mouth every Wednesday. In the morning    . traMADol (ULTRAM) 50 MG tablet Take 50 mg by mouth 2 (two) times daily as needed (for moderate shoulder pain.).    Marland Kitchen furosemide (LASIX) 20 MG tablet Take 1 tablets daily till you see your cardiolgist (Patient taking differently: Take 20 mg by mouth daily as needed for fluid or edema. ) 30 tablet 0  . oxyCODONE-acetaminophen (PERCOCET/ROXICET) 5-325 MG tablet Take 1 tablet by mouth daily as needed (for severe pain.).       No results found. However, due to the size of the patient record, not all encounters were searched. Please check Results Review for a complete set of results. No results found.  Review of Systems  All other systems reviewed and are negative.   Blood pressure (!) 155/80, pulse 80, temperature 97.7 F (36.5 C), temperature source Oral, resp. rate 18, height _0  (1.676 m), weight 90.7 kg, SpO2 96 %. Physical Exam  Constitutional: She is oriented to person, place, and time. She appears well-developed and well-nourished.  HENT:  Head: Atraumatic.  Eyes: EOM are normal.  Cardiovascular: Intact distal pulses.  Respiratory: Effort normal.  Musculoskeletal:  R shoulder pain with limited ROM. NVID  Neurological: She is alert and oriented to person, place, and time.  Skin: Skin is warm and dry.  Psychiatric: She has a normal mood and affect.     Assessment/Plan Plan R reverse TSA Risks / benefits of surgery discussed Consent on chart  NPO for OR Preop antibiotics   Isabella Stalling, MD 05/14/2018, 7:08 AM

## 2018-05-14 NOTE — Anesthesia Preprocedure Evaluation (Addendum)
Anesthesia Evaluation  Patient identified by MRN, date of birth, ID band Patient awake    Reviewed: Allergy & Precautions, H&P , NPO status , Patient's Chart, lab work & pertinent test results  Airway Mallampati: II   Neck ROM: full    Dental  (+) Teeth Intact   Pulmonary shortness of breath, sleep apnea ,  Lung CA   breath sounds clear to auscultation       Cardiovascular + dysrhythmias Atrial Fibrillation + pacemaker  Rhythm:regular Rate:Normal  S/p EP ablation.  Now paced. Underlying heart block   Neuro/Psych CVA    GI/Hepatic hiatal hernia,   Endo/Other  Hypothyroidism   Renal/GU      Musculoskeletal  (+) Arthritis ,   Abdominal   Peds  Hematology   Anesthesia Other Findings   Reproductive/Obstetrics                            Anesthesia Physical Anesthesia Plan  ASA: III  Anesthesia Plan: General   Post-op Pain Management:  Regional for Post-op pain   Induction: Intravenous  PONV Risk Score and Plan: 3 and Ondansetron, Dexamethasone, Midazolam and Treatment may vary due to age or medical condition  Airway Management Planned: Oral ETT  Additional Equipment:   Intra-op Plan:   Post-operative Plan: Extubation in OR  Informed Consent: I have reviewed the patients History and Physical, chart, labs and discussed the procedure including the risks, benefits and alternatives for the proposed anesthesia with the patient or authorized representative who has indicated his/her understanding and acceptance.     Plan Discussed with: CRNA, Anesthesiologist and Surgeon  Anesthesia Plan Comments:         Anesthesia Quick Evaluation

## 2018-05-14 NOTE — Transfer of Care (Signed)
Immediate Anesthesia Transfer of Care Note  Patient: Kayla Harrison  Procedure(s) Performed: RIGHT REVERSE SHOULDER ARTHROPLASTY (Right Shoulder)  Patient Location: PACU  Anesthesia Type:MAC combined with regional for post-op pain  Level of Consciousness: awake, alert  and oriented  Airway & Oxygen Therapy: Patient Spontanous Breathing and Patient connected to nasal cannula oxygen  Post-op Assessment: Report given to RN, Post -op Vital signs reviewed and stable and Patient moving all extremities  Post vital signs: Reviewed and stable  Last Vitals:  Vitals Value Taken Time  BP 102/63 05/14/2018  9:37 AM  Temp    Pulse 68 05/14/2018  9:38 AM  Resp 15 05/14/2018  9:38 AM  SpO2 97 % 05/14/2018  9:38 AM  Vitals shown include unvalidated device data.  Last Pain:  Vitals:   05/14/18 0604  TempSrc:   PainSc: 3          Complications: No apparent anesthesia complications

## 2018-05-15 ENCOUNTER — Encounter (HOSPITAL_COMMUNITY): Payer: Self-pay | Admitting: Orthopedic Surgery

## 2018-05-15 LAB — CBC
HCT: 28.2 % — ABNORMAL LOW (ref 36.0–46.0)
Hemoglobin: 8.8 g/dL — ABNORMAL LOW (ref 12.0–15.0)
MCH: 29.4 pg (ref 26.0–34.0)
MCHC: 31.2 g/dL (ref 30.0–36.0)
MCV: 94.3 fL (ref 78.0–100.0)
Platelets: 122 10*3/uL — ABNORMAL LOW (ref 150–400)
RBC: 2.99 MIL/uL — ABNORMAL LOW (ref 3.87–5.11)
RDW: 14.4 % (ref 11.5–15.5)
WBC: 9.1 10*3/uL (ref 4.0–10.5)

## 2018-05-15 NOTE — Discharge Summary (Signed)
Patient ID: Kayla Harrison MRN: [id number] DOB/AGE: [birth year] 72 y.o.  Admit date: 05/14/2018 Discharge date: 05/15/2018  Admission Diagnoses:  Active Problems:   S/P reverse total shoulder arthroplasty, right   Discharge Diagnoses:  Same  Past Medical History:  Diagnosis Date  . Anemia   . Arthritis    osteoarthritis - knees and right shoulder  . Atrial fibrillation [facility])    ablation- 2x's-- 1st time- [facility], [facility] in [facility]. Convergent ablation [facility] 6/14  . Atrial fibrillation ([place])   . Blood transfusion without reported diagnosis   . Breast cancer ([place])    Dr Margot Chimes, total thyroidectomy- 1999- for cancer  . Chronic bilateral pleural effusions   . Colon polyp    Dr Earlean Shawl  . Complete heart block ([place])   . Complication of anesthesia    Ketamine produces LSD reaction, bright colored nightmarish experience   . Dyslipidemia   . Dyspnea   . H/O pleural effusion    s/p thoracentesis w 3267ml withdrawn  . Hepatitis    [place] ?hepatitis   . History of dysphagia    due to radiation therapy  . History of hiatal hernia    small noted on PET scan  . History of kidney stones   . Hx of thyroid cancer    Dr Forde Dandy  . Hyperlipidemia   . Hypothyroidism   . Lung cancer, lower lobe ([place]) 01/2017   radiation RX completed 03/04/17; will start chemo 6/27, pt unaware of lung cancer  . Morbid obesity ([place])    Status post lap band surgery  . Non Hodgkin's lymphoma ([place])    on chemotherapy  . Persistent atrial fibrillation ([place])    a. s/p PVI 2008 b. s/p convergent ablation 5732 complicated by bradycardia requiring pacemaker implant  . Presence of permanent cardiac pacemaker   . Rotator cuff tear    Right  . Sinus node dysfunction ([place])    Complicating convergent ablation 6/14  . Stroke [facility])    2003- [place] x2  . SVC syndrome     Surgeries: Procedure(s): RIGHT REVERSE SHOULDER ARTHROPLASTY on 05/14/2018   Consultants:    Discharged Condition: Improved  Hospital Course: LASHUN RAMSEYER is an 72 y.o. female who was admitted 05/14/2018 for operative treatment of Right shoulder OA. Patient has severe unremitting pain that affects sleep, daily activities, and work/hobbies. After pre-op clearance the patient was taken to the operating room on 05/14/2018 and underwent  Procedure(s): RIGHT REVERSE SHOULDER ARTHROPLASTY.    Patient was given perioperative antibiotics:  Anti-infectives (From admission, onward)   Start     Dose/Rate Route Frequency Ordered Stop   05/14/18 1330  ceFAZolin (ANCEF) IVPB 2g/100 mL premix     2 g 200 mL/hr over 30 Minutes Intravenous Every 6 hours 05/14/18 1017 05/15/18 0358   05/14/18 0700  ceFAZolin (ANCEF) IVPB 2g/100 mL premix     2 g 200 mL/hr over 30 Minutes Intravenous To ShortStay Surgical 05/13/18 0851 05/14/18 0755       Patient was given sequential compression devices, early ambulation, and chemoprophylaxis to prevent DVT.  Patient benefited maximally from hospital stay and there were no complications.    Recent vital signs:  Patient Vitals for the past 24 hrs:  BP Temp Temp src Pulse Resp SpO2  05/15/18 0421 119/67 98.1 F (36.7 C) Oral 76 16 90 %  05/15/18 0046 127/70 98.2 F (36.8 C) Oral 79 16 93 %  05/14/18 2023 136/74 98.2 F (36.8 C) Oral 78 16 92 %  05/14/18 1407 130/63 (!) 97.5 F (36.4 C) Oral 83 16 95 %  05/14/18 1011 (!) 141/72 97.7 F (36.5 C) Oral 70 16 95 %  05/14/18 0950 134/76 97.7 F (36.5 C) - 69 14 97 %  05/14/18 0935 102/63 - - 69 17 100 %  05/14/18 0920 (!) 163/75 97.7 F (36.5 C) - 70 15 96 %     Recent laboratory studies:  Recent Labs    05/14/18 0626 05/15/18 0445  WBC  --  9.1  HGB  --  8.8*  HCT  --  28.2*  PLT  --  122*  INR 1.07  --      Discharge Medications:   Allergies as of 05/15/2018      Reactions   Dofetilide Other (See Comments)   Prolonged QT interval   Rivaroxaban Other (See Comments)   Nose Bleed X 6  hrs ; packing in ER Nasal hemorrhage   Epinephrine Rash, Other (See Comments)   Oral anesthetic Dental form only (liquid). Patient stated she will become "out of it" she can hear you but cannot respond in normal fashion. "not fully with it"   Adhesive [tape] Rash   Paper tape as well   Ketamine Other (See Comments)   HALLUCINATIONS      Medication List    TAKE these medications   amiodarone 200 MG tablet Commonly known as:  PACERONE Take 1 tablet (200 mg total) by mouth daily.   ELIQUIS 5 MG Tabs tablet Generic drug:  apixaban TAKE 1 TABLET BY MOUTH TWO  TIMES DAILY   furosemide 20 MG tablet Commonly known as:  LASIX Take 1 tablets daily till you see your cardiolgist What changed:    how much to take  how to take this  when to take this  reasons to take this  additional instructions   levothyroxine 175 MCG tablet Commonly known as:  SYNTHROID, LEVOTHROID Take 175 mcg by mouth See admin instructions. TAKE 1 TABLET (175 MCG) BY MOUTH DAILY, EXCEPT FRIDAYS.   multivitamin tablet Take 1 tablet by mouth daily.   oxyCODONE-acetaminophen 5-325 MG tablet Commonly known as:  PERCOCET/ROXICET Take 1-2 tablets by mouth every 4 (four) hours as needed (for severe pain.). What changed:    how much to take  when to take this   rosuvastatin 10 MG tablet Commonly known as:  CRESTOR Take 10 mg by mouth every Wednesday. In the morning   traMADol 50 MG tablet Commonly known as:  ULTRAM Take 50 mg by mouth 2 (two) times daily as needed (for moderate shoulder pain.).   XIIDRA 5 % Soln Generic drug:  Lifitegrast Apply 1 drop to eye at bedtime.       Diagnostic Studies: Ir Removal Tun Access W/ Port W/o Fl  Result Date: 04/20/2018 CLINICAL DATA:  History of lymphoma, completed chemotherapy, no longer in need of Port a Catheter Port a catheter was placed by Dr. Vernard Gambles on 02/24/2017 and functioned well throughout duration of usage. There is no clinical sign or concern of  infection EXAM: REMOVAL OF IMPLANTED TUNNELED PORT-A-CATH MEDICATIONS: Ancef 2 gm IV. The antibiotic was administered within 1 hour prior to the start of the procedure. ANESTHESIA/SEDATION: Moderate (conscious) sedation was employed during this procedure. A total of Versed 3 mg and Fentanyl 100 mcg was administered intravenously. Moderate Sedation Time: 18 minutes. The patient's level of consciousness and vital signs were monitored continuously by  radiology nursing throughout the procedure under my direct supervision. FLUOROSCOPY TIME:  None PROCEDURE: Informed written consent was obtained from the patient after a discussion of the risk, benefits and alternatives to the procedure. The patient was positioned supine on the fluoroscopy table and the right chest Port-A-Cath site was prepped with chlorhexidine. A sterile gown and gloves were worn during the procedure. Local anesthesia was provided with 1% lidocaine with epinephrine. A timeout was performed prior to the initiation of the procedure. An incision was made overlying the Port-A-Cath with a #15 scalpel. Utilizing sharp and blunt dissection, the Port-A-Cath was removed completely. The pocked was irrigated with sterile saline. Wound closure was performed with subcutaneous 2-0 Vicryl, subcuticular 4-0 Vicryl, Dermabond and Steri-Strips. A dressing was placed. The patient tolerated the procedure well without immediate post procedural complication. FINDINGS: Successful removal of implant Port-A-Cath without immediate post procedural complication. IMPRESSION: Successful removal of implanted Port-A-Cath. Electronically Signed   By: Sandi Mariscal M.D.   On: 04/20/2018 09:50   Dg Shoulder Right Port  Result Date: 05/14/2018 CLINICAL DATA:  Postop right shoulder EXAM: PORTABLE RIGHT SHOULDER COMPARISON:  None similar FINDINGS: Reverse glenohumeral arthroplasty on the right with expected soft tissue gas. No evidence of periprosthetic fracture. Prosthetic joint is in  typical alignment. Remote distal clavicle resection. IMPRESSION: No acute finding after glenohumeral arthroplasty. Electronically Signed   By: Monte Fantasia M.D.   On: 05/14/2018 10:46    Disposition: Discharge disposition: 01-Home or Self Care       Discharge Instructions    Call MD / Call 911   Complete by:  As directed    If you experience chest pain or shortness of breath, CALL 911 and be transported to the hospital emergency room.  If you develope a fever above 101 F, pus (white drainage) or increased drainage or redness at the wound, or calf pain, call your surgeon's office.   Constipation Prevention   Complete by:  As directed    Drink plenty of fluids.  Prune juice may be helpful.  You may use a stool softener, such as Colace (over the counter) 100 mg twice a day.  Use MiraLax (over the counter) for constipation as needed.   Diet - low sodium heart healthy   Complete by:  As directed    Increase activity slowly as tolerated   Complete by:  As directed       Follow-up Information    Malena Catholic, MD. Schedule an appointment as soon as possible for a visit in 2 weeks.   Contact information: [place] [address] [phone]            Signed: Grier Mitts 05/15/2018, 8:13 AM

## 2018-05-15 NOTE — Progress Notes (Signed)
   PATIENT ID: Kayla Harrison   1 Day Post-Op Procedure(s) (LRB): RIGHT REVERSE SHOULDER ARTHROPLASTY (Right)  Subjective: Comfortable, no pain. Hopes to go home today, no other complaints or concerns.   Objective:  Vitals:   05/15/18 0046 05/15/18 0421  BP: 127/70 119/67  Pulse: 79 76  Resp: 16 16  Temp: 98.2 F (36.8 C) 98.1 F (36.7 C)  SpO2: 93% 90%     R UE dressing c/d/i Wiggles fingers, block still working, Physicist, medical  Labs:  Recent Labs    05/15/18 0445  HGB 8.8*   Recent Labs    05/15/18 0445  WBC 9.1  RBC 2.99*  HCT 28.2*  PLT 122*  No results for input(s): NA, K, CL, CO2, BUN, CREATININE, GLUCOSE, CALCIUM in the last 72 hours.  Assessment and Plan: 1 day s/p R tsa OT- PROM goal to 90 FF 0 ER D/c home when cleared by OT Scripts in chart Fu with Dr. Tamera Punt in 2 weeks  VTE proph: on eilquis, scds

## 2018-05-15 NOTE — Evaluation (Addendum)
Occupational Therapy Evaluation Patient Details Name: Kayla Harrison MRN: [id number] DOB: [birth year] Today's Date: 05/15/2018    History of Present Illness Pt is a 72 y/o female s/p R reverse shoulder arthroplasty due to endstage arthritis with severe rotator cuff pathology.  PMH signifincant for but not limited to: arthritis, anemia, a fib, breast CA, pleural effusions, complete heart block, hepatitis, lung cancer, non hodgkins lymphoma, R rotator cuff tear, permanent cardiac pacemakers, CVA, R TKA.    Clinical Impression   PTA patient reports independent with ADLs, limited IADLs and using SPC at times for mobility.  She currently requires minA for UB ADL, setup assist for grooming, setup assist for LB dressing, and supervision for bed mobility/toilet transfers. She has been educated on precautions, sling management and wear schedule, positioning of UE, pain management and edema control using ice, ADL compensatory techniques, exercises, and safety. Requires AAROM for exercises to elbow, wrist today as nerve block still active; requires min assist for sling mgmt. She reports she will have 24/7 assistance from neighbors initially, fading to intermittent assistance as needed.  Understands she will need assistance with ADLs and will have the support she will need at home.  At this time, patient does not need further OT services after discharge.  Will continue to follow while admitted.       Follow Up Recommendations  Follow surgeon's recommendation for DC plan and follow-up therapies;Supervision/Assistance - 24 hour    Equipment Recommendations  None recommended by OT    Recommendations for Other Services       Precautions / Restrictions Precautions Precautions: Shoulder Type of Shoulder Precautions: passive protocol: no ROM at shoulder, AROM elbow/wrist/hand Shoulder Interventions: Shoulder sling/immobilizer;At all times;Off for dressing/bathing/exercises Precaution Booklet Issued: Yes  (comment) Precaution Comments: reviewed precautions thorouglhy with patinet  Required Braces or Orthoses: Sling Restrictions Weight Bearing Restrictions: Yes RUE Weight Bearing: Non weight bearing      Mobility Bed Mobility Overal bed mobility: Needs Assistance Bed Mobility: Supine to Sit     Supine to sit: Supervision     General bed mobility comments: supervision for safety/ HOB elevated; reviewed sleeping and positioning   Transfers Overall transfer level: Needs assistance   Transfers: Sit to/from Stand Sit to Stand: Supervision         General transfer comment: supervision for safety, increased time and effort    Balance Overall balance assessment: Mild deficits observed, not formally tested                                         ADL either performed or assessed with clinical judgement   ADL Overall ADL's : Needs assistance/impaired Eating/Feeding: Set up;Sitting   Grooming: Sitting;Set up   Upper Body Bathing: Minimal assistance;Sitting;Cueing for compensatory techniques;Cueing for UE precautions Upper Body Bathing Details (indicate cue type and reason): reviewed technqiues and assistance needed due to R UE restrictions  Lower Body Bathing: Min guard;Cueing for compensatory techniques;Sit to/from stand Lower Body Bathing Details (indicate cue type and reason): min guard for safety while standing  Upper Body Dressing : Minimal assistance;Cueing for UE precautions;Cueing for compensatory techniques;Sitting Upper Body Dressing Details (indicate cue type and reason): reviewed and practiced techniques for UB dressing  Lower Body Dressing: Sit to/from stand;Cueing for compensatory techniques;Supervision/safety Lower Body Dressing Details (indicate cue type and reason): cueing for compensatory techniques and safety Toilet Transfer: Supervision/safety;Ambulation(simulated in room)   Toileting-  Clothing Manipulation and Hygiene: Supervision/safety;Sit  to/from stand;Cueing for compensatory techniques Toileting - Clothing Manipulation Details (indicate cue type and reason): educated on compensatory techniques for increased ease and safety   Tub/Shower Transfer Details (indicate cue type and reason): pt plans to basin bathe initally, agreeable to sit and have assistance once ready to shower  Functional mobility during ADLs: Supervision/safety;Cueing for safety General ADL Comments: completed ADLs, exercises to R UE, and transfers      Vision Baseline Vision/History: Wears glasses Wears Glasses: Reading only Vision Assessment?: No apparent visual deficits     Perception     Praxis      Pertinent Vitals/Pain Pain Assessment: No/denies pain     Hand Dominance Right   Extremity/Trunk Assessment Upper Extremity Assessment Upper Extremity Assessment: RUE deficits/detail;Generalized weakness RUE Deficits / Details: s/p surgery shoulder  RUE: Unable to fully assess due to immobilization RUE Sensation: decreased light touch(nerve block intact, no sensation to hand/ dulled fingertips) RUE Coordination: decreased fine motor;decreased gross motor   Lower Extremity Assessment Lower Extremity Assessment: Overall WFL for tasks assessed       Communication Communication Communication: No difficulties   Cognition Arousal/Alertness: Awake/alert Behavior During Therapy: WFL for tasks assessed/performed Overall Cognitive Status: Within Functional Limits for tasks assessed                                     General Comments  neighbor present throughout session     Exercises Exercises: Shoulder;Hand exercises Shoulder Exercises Elbow Flexion: AAROM;Right;10 reps;Supine Elbow Extension: AAROM;Right;10 reps;Supine Wrist Flexion: AAROM;Right;10 reps;Supine Wrist Extension: AAROM;Right;10 reps;Supine Digit Composite Flexion: AROM;Right;10 reps;Supine Composite Extension: AROM;Right;10 reps;Supine Neck Lateral Flexion -  Right: AROM;5 reps;Seated Neck Lateral Flexion - Left: AROM;5 reps;Seated Hand Exercises Forearm Supination: AAROM;Right;10 reps;Supine Forearm Pronation: AAROM;Right;10 reps;Supine   Shoulder Instructions Shoulder Instructions Donning/doffing shirt without moving shoulder: Minimal assistance Method for sponge bathing under operated UE: Minimal assistance Donning/doffing sling/immobilizer: Minimal assistance Correct positioning of sling/immobilizer: Minimal assistance ROM for elbow, wrist and digits of operated UE: Minimal assistance Sling wearing schedule (on at all times/off for ADL's): Modified independent Proper positioning of operated UE when showering: Supervision/safety Positioning of UE while sleeping: Minimal assistance    Home Living Family/patient expects to be discharged to:: Private residence Living Arrangements: Alone Available Help at Discharge: Friend(s);Available PRN/intermittently Type of Home: Other(Comment)(condo ) Home Access: Level entry     Home Layout: One level     Bathroom Shower/Tub: Walk-in shower;Curtain   Bathroom Toilet: Handicapped height     Home Equipment: Environmental consultant - 4 wheels;Cane - single point          Prior Functioning/Environment Level of Independence: Independent with assistive device(s)        Comments: used SPC for mobility outdoors, reports generalized weakness from chemo; light IADLs (has housekeepr 1x/month), ADLs independently (standing to shower)         OT Problem List: Decreased strength;Decreased range of motion;Decreased activity tolerance;Impaired balance (sitting and/or standing);Decreased coordination;Decreased knowledge of use of DME or AE;Decreased knowledge of precautions;Impaired UE functional use      OT Treatment/Interventions: Self-care/ADL training;Therapeutic exercise;Patient/family education;Therapeutic activities;DME and/or AE instruction;Energy conservation    OT Goals(Current goals can be found in the  care plan section) Acute Rehab OT Goals Patient Stated Goal: to use my right arm again OT Goal Formulation: With patient Time For Goal Achievement: 05/22/18 Potential to Achieve Goals: Good  OT Frequency: Min  2X/week   Barriers to D/C:            Co-evaluation              AM-PAC PT "6 Clicks" Daily Activity     Outcome Measure Help from another person eating meals?: A Little Help from another person taking care of personal grooming?: A Little Help from another person toileting, which includes using toliet, bedpan, or urinal?: None Help from another person bathing (including washing, rinsing, drying)?: A Little Help from another person to put on and taking off regular upper body clothing?: A Little Help from another person to put on and taking off regular lower body clothing?: None 6 Click Score: 20   End of Session Equipment Utilized During Treatment: Other (comment)(sling )  Activity Tolerance: Patient tolerated treatment well Patient left: with call bell/phone within reach;with family/visitor present;Other (comment)(seated EOB )  OT Visit Diagnosis: Muscle weakness (generalized) (M62.81)                Time: 8727-6184 OT Time Calculation (min): 52 min Charges:  OT General Charges $OT Visit: 1 Visit OT Evaluation $OT Eval Moderate Complexity: 1 Mod OT Treatments $Self Care/Home Management : 23-37 mins  Delight Stare, OTR/L  Pager [place] 05/15/2018, 10:26 AM

## 2018-05-21 LAB — CUP PACEART REMOTE DEVICE CHECK
Battery Remaining Longevity: 65 mo
Battery Voltage: 3 V
Brady Statistic AP VS Percent: 92.95 %
Brady Statistic AS VS Percent: 3.69 %
Brady Statistic RA Percent Paced: 96.02 %
Brady Statistic RV Percent Paced: 3.3 %
Implantable Lead Implant Date: 20140618
Implantable Lead Location: 753859
Implantable Lead Location: 753860
Implantable Pulse Generator Implant Date: 20140618
Lead Channel Impedance Value: 969 Ohm
Lead Channel Pacing Threshold Amplitude: 0.625 V
Lead Channel Pacing Threshold Amplitude: 0.875 V
Lead Channel Sensing Intrinsic Amplitude: 1.375 mV
Lead Channel Sensing Intrinsic Amplitude: 1.375 mV
Lead Channel Setting Sensing Sensitivity: 0.9 mV
MDC IDC LEAD IMPLANT DT: 20140618
MDC IDC MSMT LEADCHNL RA IMPEDANCE VALUE: 361 Ohm
MDC IDC MSMT LEADCHNL RA IMPEDANCE VALUE: 475 Ohm
MDC IDC MSMT LEADCHNL RA PACING THRESHOLD PULSEWIDTH: 0.4 ms
MDC IDC MSMT LEADCHNL RV IMPEDANCE VALUE: 912 Ohm
MDC IDC MSMT LEADCHNL RV PACING THRESHOLD PULSEWIDTH: 0.4 ms
MDC IDC MSMT LEADCHNL RV SENSING INTR AMPL: 11 mV
MDC IDC MSMT LEADCHNL RV SENSING INTR AMPL: 11 mV
MDC IDC SESS DTM: 20190801193038
MDC IDC SET LEADCHNL RA PACING AMPLITUDE: 2 V
MDC IDC SET LEADCHNL RV PACING AMPLITUDE: 2.5 V
MDC IDC SET LEADCHNL RV PACING PULSEWIDTH: 0.4 ms
MDC IDC STAT BRADY AP VP PERCENT: 3.33 %
MDC IDC STAT BRADY AS VP PERCENT: 0.04 %

## 2018-05-26 DIAGNOSIS — Z6831 Body mass index (BMI) 31.0-31.9, adult: Secondary | ICD-10-CM | POA: Diagnosis not present

## 2018-05-26 DIAGNOSIS — N200 Calculus of kidney: Secondary | ICD-10-CM | POA: Diagnosis not present

## 2018-05-26 DIAGNOSIS — E890 Postprocedural hypothyroidism: Secondary | ICD-10-CM | POA: Diagnosis not present

## 2018-05-26 DIAGNOSIS — Z23 Encounter for immunization: Secondary | ICD-10-CM | POA: Diagnosis not present

## 2018-05-26 DIAGNOSIS — C884 Extranodal marginal zone B-cell lymphoma of mucosa-associated lymphoid tissue [MALT-lymphoma]: Secondary | ICD-10-CM | POA: Diagnosis not present

## 2018-05-26 DIAGNOSIS — E7849 Other hyperlipidemia: Secondary | ICD-10-CM | POA: Diagnosis not present

## 2018-05-26 DIAGNOSIS — R7301 Impaired fasting glucose: Secondary | ICD-10-CM | POA: Diagnosis not present

## 2018-05-26 DIAGNOSIS — C73 Malignant neoplasm of thyroid gland: Secondary | ICD-10-CM | POA: Diagnosis not present

## 2018-05-26 DIAGNOSIS — C50911 Malignant neoplasm of unspecified site of right female breast: Secondary | ICD-10-CM | POA: Diagnosis not present

## 2018-05-29 ENCOUNTER — Ambulatory Visit (INDEPENDENT_AMBULATORY_CARE_PROVIDER_SITE_OTHER)
Admission: RE | Admit: 2018-05-29 | Discharge: 2018-05-29 | Disposition: A | Discharge status: Home / Self Care | Payer: Medicare Other | Source: Ambulatory Visit | Attending: Pulmonary Disease | Admitting: Pulmonary Disease

## 2018-05-29 DIAGNOSIS — Z96611 Presence of right artificial shoulder joint: Secondary | ICD-10-CM | POA: Diagnosis not present

## 2018-05-29 DIAGNOSIS — R911 Solitary pulmonary nodule: Secondary | ICD-10-CM | POA: Diagnosis not present

## 2018-05-29 DIAGNOSIS — M19011 Primary osteoarthritis, right shoulder: Secondary | ICD-10-CM | POA: Diagnosis not present

## 2018-05-29 DIAGNOSIS — Z471 Aftercare following joint replacement surgery: Secondary | ICD-10-CM | POA: Diagnosis not present

## 2018-05-29 DIAGNOSIS — R918 Other nonspecific abnormal finding of lung field: Secondary | ICD-10-CM | POA: Diagnosis not present

## 2018-06-03 ENCOUNTER — Telehealth: Payer: Self-pay | Admitting: Pulmonary Disease

## 2018-06-03 NOTE — Telephone Encounter (Signed)
Dr. [name] called for Dr. Halford Chessman stating this patient is scheduled for a CT without contrast on Friday, 06/05/18.  Dr. Forde Dandy states the patient's oncologist is also wanting the patient to have a CT but with contrast and Dr. Forde Dandy is wanting to know if Dr. Halford Chessman would be willing to have the order changed for CT to be done with contrast to meet the needs of oncologist as well.  If Dr. Halford Chessman has any questions, Dr. Forde Dandy states Dr. Halford Chessman can call him at [phone].

## 2018-06-03 NOTE — Telephone Encounter (Signed)
VS please advise on message below. Would you be ok with changing CT to with contrast instead of without. Please advise, thank you.

## 2018-06-04 NOTE — Telephone Encounter (Signed)
She just had her CT chest on 05/29/18.  If she needs additional CT imaging, then this needs to be determined by her oncologist.

## 2018-06-04 NOTE — Telephone Encounter (Signed)
Called and spoke with patient regarding VS recommendations Pt verbalized understanding and denied any questions or concerns at this time.  Nothing further needed.

## 2018-06-10 ENCOUNTER — Encounter: Payer: Self-pay | Admitting: Pulmonary Disease

## 2018-06-10 ENCOUNTER — Ambulatory Visit (INDEPENDENT_AMBULATORY_CARE_PROVIDER_SITE_OTHER): Payer: Medicare Other | Admitting: Pulmonary Disease

## 2018-06-10 DIAGNOSIS — R911 Solitary pulmonary nodule: Secondary | ICD-10-CM | POA: Diagnosis not present

## 2018-06-10 DIAGNOSIS — N200 Calculus of kidney: Secondary | ICD-10-CM | POA: Diagnosis not present

## 2018-06-10 NOTE — Patient Instructions (Signed)
Will schedule CT chest for March 2020 and follow up after that

## 2018-06-10 NOTE — Progress Notes (Signed)
[facility]  Chief Complaint  Patient presents with  . Follow-up    Pt has improved since last visit. Pt would like to go over CT results.     Constitutional: BP 128/70 (BP Location: Left Arm, Cuff Size: Normal)   Pulse 80   Ht _0  (1.676 m)   Wt 203 lb (92.1 kg)   SpO2 95%   BMI 32.77 kg/m   History of Present Illness: Kayla Harrison is a 72 y.o. female with dyspnea, dry cough.  Kayla Harrison has hx of non Hodgkin's lymphoma s/p R-CHOP from 04/02/17 to 07/16/17.  Kayla Harrison had CT chest earlier this month.  Decreased size of nodular cluster in RLL.  Has tiny Rt effusion that is somewhat more prominent than last scan.    Not having cough anymore.  Denies fever, chest pain, sputum, or hemoptysis.  Kayla Harrison isn't having any trouble with her swallowing.    Had Rt shoulder surgery few weeks ago.  Kayla Harrison is recovering well.  Comprehensive Respiratory Exam:  Appearance - well kempt  ENMT - nasal mucosa moist, turbinates clear, midline nasal septum, no dental lesions, no gingival bleeding, no oral exudates, no tonsillar hypertrophy Neck - no masses, trachea midline, no thyromegaly, no elevation in JVP Respiratory - normal appearance of chest wall, normal respiratory effort w/o accessory muscle use, no dullness on percussion, no wheezing or rales CV - s1s2 regular rate and rhythm, no murmurs, no peripheral edema, radial pulses symmetric GI - soft, non tender Lymph - no adenopathy noted in neck and axillary areas MSK - normal muscle strength and tone, normal gait Ext - no cyanosis, clubbing, or joint inflammation noted Skin - no rashes, lesions, or ulcers Neuro - oriented to person, place, and time Psych - normal mood and affect  Discussion: Kayla Harrison likely had aspiration event after having a dental procedure.  CT chest appearance improved, and clinically better.  Assessment/Plan:  Nodular consolidation RLL. - f/u CT chest w/o contrast in March 2020  Non  Hodgkin's Lymphoma. - f/u with Dr. Lindi Adie in November 2019 - Kayla Harrison will call Dr. Lindi Adie to determine when Kayla Harrison needs CT abdomen and if CT imaging needs to be with contrast; can then try to coordinate with when Kayla Harrison has f/u CT chest  Radiation fibrosis RUL in setting no B cell lymphoma. Tiny Rt pleural effusion. - will monitor on f/u imaging - discussed symptoms to monitor for - no intervention needed at this time   Patient Instructions  Will schedule CT chest for March 2020 and follow up after that   Chesley Mires, MD [place] 06/10/2018, 10:17 AM Pager:  [phone]  Flow Sheet  Pulmonary tests: PFT 01/20/13 >> FEV1 2.97 (118%), FEV1% 86, TLC 5.91 (110%), DLCO 82% CT angio chest 09/15/17 >> b/l pleural effusions, radiation fibrosis RUL CT chest 528/19 >>  stable 6 mm LLL nodule, patchy nodular consolidation RLL, stable 3 mm RUL nodule, stable Rt paramediastinal post radiation changes. CT chest 05/29/18 >> XRT changes paramediastium, decreased size of nodular cluster RLL, tiny Rt effusion  Cardiac tests: Echo 09/17/17 >> EF 55 to 60%, mild MR, severe LA dilation, PAS 44 mmHg  Past Medical History: Kayla Harrison  has a past medical history of Anemia, Arthritis, Atrial fibrillation ([place]), Atrial fibrillation ([place]), Blood transfusion without reported diagnosis, Breast cancer ([place]), Chronic bilateral pleural effusions, Colon polyp, Complete heart block ([place]), Complication of anesthesia, Dyslipidemia, Dyspnea, H/O pleural effusion, Hepatitis, History of dysphagia, History of hiatal hernia, History  of kidney stones, thyroid cancer, Hyperlipidemia, Hypothyroidism, Lung cancer, lower lobe ([place]) (01/2017), Morbid obesity ([place]), Non Hodgkin's lymphoma ([place]), Persistent atrial fibrillation ([place]), Presence of permanent cardiac pacemaker, Rotator cuff tear, Sinus node dysfunction ([place]), Stroke ([place]), and SVC syndrome.  Past Surgical History: Kayla Harrison  has a past surgical history that includes  Thyroidectomy (1998); bso (1998); Cholecystectomy; Tonsillectomy; Colonoscopy w/ polypectomy; afib ablation; Abdominal hysterectomy (1983); Breast lumpectomy (2010); Laparoscopic gastric banding (07/10/2010); Appendectomy; Knee arthroscopy; laparotomy; Total knee arthroplasty (04/13/2012); Cardioversion (10/09/2012); Cardioversion (10/09/2012); Cardioversion (N/A, 11/20/2012); Pacemaker insertion (03/10/2013); pocket revision (N/A, 12/08/2013); Eye surgery; Cystoscopy with retrograde pyelogram, ureteroscopy and stent placement (Right, 02/06/2015); Holmium laser application (N/A, 0/76/8088); Cystoscopy (N/A, 02/06/2015); Video bronchoscopy with endobronchial ultrasound (N/A, 02/07/2017); IR FLUORO GUIDE PORT INSERTION RIGHT (02/24/2017); IR US Guide Vasc Access Right (02/24/2017); Bone marrow biopsy (02/21/2017); Cystoscopy with retrograde pyelogram, ureteroscopy and stent placement (Right, 03/07/2017); Holmium laser application (Right, 10/02/3157); IR PATIENT EVAL TECH 0-60 MINS (03/11/2017); Cardioversion (N/A, 07/18/2017); TEE with cardioversion (09/22/2017); PORTA CATH INSERTION; TEE without cardioversion (N/A, 10/03/2017); Cardioversion (N/A, 10/03/2017); IR NEPHROSTOMY PLACEMENT RIGHT (11/17/2017); Nephrolithotomy (Right, 11/17/2017); Cystoscopy w/ retrogrades (Left, 11/17/2017); Holmium laser application (Left, 4/58/5929); Cardioversion (N/A, 01/07/2018); IR REMOVAL TUN ACCESS W/ PORT W/O FL MOD SED (04/20/2018); Cardiac catheterization; and Reverse shoulder arthroplasty (Right, 05/14/2018).  Family History: Her family history includes Colon cancer in her father; Diabetes in her brother, paternal aunt, paternal grandmother, and sister; Heart attack in her father; Heart disease (age of onset: 47) in her father.  Social History: Kayla Harrison  reports that Kayla Harrison has never smoked. Kayla Harrison has never used smokeless tobacco. Kayla Harrison reports that Kayla Harrison does not drink alcohol or use drugs.  Medications: Allergies as of 06/10/2018      Reactions    Dofetilide Other (See Comments)   Prolonged QT interval   Rivaroxaban Other (See Comments)   Nose Bleed X 6 hrs ; packing in ER Nasal hemorrhage   Epinephrine Rash, Other (See Comments)   Oral anesthetic Dental form only (liquid). Patient stated Kayla Harrison will become "out of it" Kayla Harrison can hear you but cannot respond in normal fashion. "not fully with it"   Adhesive [tape] Rash   Paper tape as well   Ketamine Other (See Comments)   HALLUCINATIONS      Medication List        Accurate as of 06/10/18 10:17 AM. Always use your most recent med list.          acetaminophen 500 MG tablet Commonly known as:  TYLENOL Take 1,000 mg by mouth every 6 (six) hours as needed.   amiodarone 200 MG tablet Commonly known as:  PACERONE Take 1 tablet (200 mg total) by mouth daily.   ELIQUIS 5 MG Tabs tablet Generic drug:  apixaban TAKE 1 TABLET BY MOUTH TWO  TIMES DAILY   furosemide 20 MG tablet Commonly known as:  LASIX Take 1 tablets daily till you see your cardiolgist   levothyroxine 175 MCG tablet Commonly known as:  SYNTHROID, LEVOTHROID Take 175 mcg by mouth See admin instructions. TAKE 1 TABLET (175 MCG) BY MOUTH DAILY, EXCEPT FRIDAYS.   multivitamin tablet Take 1 tablet by mouth daily.   oxyCODONE-acetaminophen 5-325 MG tablet Commonly known as:  PERCOCET/ROXICET Take 1-2 tablets by mouth every 4 (four) hours as needed (for severe pain.).   rosuvastatin 10 MG tablet Commonly known as:  CRESTOR Take 10 mg by mouth every Wednesday. In the morning   traMADol 50 MG tablet Commonly known as:  Veatrice Bourbon  Take 50 mg by mouth 2 (two) times daily as needed (for moderate shoulder pain.).   XIIDRA 5 % Soln Generic drug:  Lifitegrast Apply 1 drop to eye at bedtime.

## 2018-06-26 DIAGNOSIS — M19011 Primary osteoarthritis, right shoulder: Secondary | ICD-10-CM | POA: Diagnosis not present

## 2018-06-27 ENCOUNTER — Other Ambulatory Visit: Payer: Self-pay

## 2018-06-27 ENCOUNTER — Emergency Department (HOSPITAL_COMMUNITY)
Admission: EM | Admit: 2018-06-27 | Discharge: 2018-06-27 | Disposition: A | Discharge status: Home / Self Care | Payer: Medicare Other | Attending: Emergency Medicine | Admitting: Emergency Medicine

## 2018-06-27 DIAGNOSIS — R51 Headache: Secondary | ICD-10-CM | POA: Insufficient documentation

## 2018-06-27 DIAGNOSIS — Z85118 Personal history of other malignant neoplasm of bronchus and lung: Secondary | ICD-10-CM | POA: Insufficient documentation

## 2018-06-27 DIAGNOSIS — Z85238 Personal history of other malignant neoplasm of thymus: Secondary | ICD-10-CM | POA: Diagnosis not present

## 2018-06-27 DIAGNOSIS — R42 Dizziness and giddiness: Secondary | ICD-10-CM | POA: Diagnosis not present

## 2018-06-27 DIAGNOSIS — I10 Essential (primary) hypertension: Secondary | ICD-10-CM | POA: Diagnosis not present

## 2018-06-27 DIAGNOSIS — Z950 Presence of cardiac pacemaker: Secondary | ICD-10-CM | POA: Insufficient documentation

## 2018-06-27 DIAGNOSIS — Z96611 Presence of right artificial shoulder joint: Secondary | ICD-10-CM | POA: Insufficient documentation

## 2018-06-27 DIAGNOSIS — E162 Hypoglycemia, unspecified: Secondary | ICD-10-CM | POA: Diagnosis not present

## 2018-06-27 DIAGNOSIS — Z79899 Other long term (current) drug therapy: Secondary | ICD-10-CM | POA: Insufficient documentation

## 2018-06-27 DIAGNOSIS — Z7901 Long term (current) use of anticoagulants: Secondary | ICD-10-CM | POA: Diagnosis not present

## 2018-06-27 DIAGNOSIS — R030 Elevated blood-pressure reading, without diagnosis of hypertension: Secondary | ICD-10-CM

## 2018-06-27 DIAGNOSIS — I5032 Chronic diastolic (congestive) heart failure: Secondary | ICD-10-CM | POA: Diagnosis not present

## 2018-06-27 DIAGNOSIS — Z8572 Personal history of non-Hodgkin lymphomas: Secondary | ICD-10-CM | POA: Diagnosis not present

## 2018-06-27 DIAGNOSIS — E039 Hypothyroidism, unspecified: Secondary | ICD-10-CM | POA: Insufficient documentation

## 2018-06-27 DIAGNOSIS — E161 Other hypoglycemia: Secondary | ICD-10-CM | POA: Diagnosis not present

## 2018-06-27 DIAGNOSIS — Z853 Personal history of malignant neoplasm of breast: Secondary | ICD-10-CM | POA: Insufficient documentation

## 2018-06-27 DIAGNOSIS — Z85850 Personal history of malignant neoplasm of thyroid: Secondary | ICD-10-CM | POA: Diagnosis not present

## 2018-06-27 DIAGNOSIS — R110 Nausea: Secondary | ICD-10-CM | POA: Diagnosis not present

## 2018-06-27 LAB — BASIC METABOLIC PANEL
ANION GAP: 9 (ref 5–15)
BUN: 14 mg/dL (ref 8–23)
CALCIUM: 9.4 mg/dL (ref 8.9–10.3)
CO2: 26 mmol/L (ref 22–32)
Chloride: 104 mmol/L (ref 98–111)
Creatinine, Ser: 0.76 mg/dL (ref 0.44–1.00)
Glucose, Bld: 99 mg/dL (ref 70–99)
Potassium: 3.8 mmol/L (ref 3.5–5.1)
Sodium: 139 mmol/L (ref 135–145)

## 2018-06-27 LAB — CBC WITH DIFFERENTIAL/PLATELET
BASOS ABS: 0 10*3/uL (ref 0.0–0.1)
BASOS PCT: 0 %
EOS PCT: 0 %
Eosinophils Absolute: 0 10*3/uL (ref 0.0–0.7)
HCT: 38.7 % (ref 36.0–46.0)
Hemoglobin: 12.2 g/dL (ref 12.0–15.0)
Lymphocytes Relative: 14 %
Lymphs Abs: 0.8 10*3/uL (ref 0.7–4.0)
MCH: 29.1 pg (ref 26.0–34.0)
MCHC: 31.5 g/dL (ref 30.0–36.0)
MCV: 92.4 fL (ref 78.0–100.0)
MONO ABS: 0.4 10*3/uL (ref 0.1–1.0)
Monocytes Relative: 8 %
NEUTROS ABS: 4.6 10*3/uL (ref 1.7–7.7)
Neutrophils Relative %: 78 %
PLATELETS: 229 10*3/uL (ref 150–400)
RBC: 4.19 MIL/uL (ref 3.87–5.11)
RDW: 15.4 % (ref 11.5–15.5)
WBC: 5.9 10*3/uL (ref 4.0–10.5)

## 2018-06-27 MED ORDER — MECLIZINE HCL 25 MG PO TABS
50.0000 mg | ORAL_TABLET | Freq: Once | ORAL | Status: AC
  Administered 2018-06-27: 50 mg via ORAL
  Filled 2018-06-27: qty 2

## 2018-06-27 MED ORDER — SODIUM CHLORIDE 0.9 % IV BOLUS: 1000.0000 mL | Freq: Once | INTRAVENOUS | Status: DC

## 2018-06-27 MED ORDER — METOCLOPRAMIDE HCL 5 MG/ML IJ SOLN
10.0000 mg | Freq: Once | INTRAMUSCULAR | Status: DC
  Filled 2018-06-27: qty 2

## 2018-06-27 MED ORDER — ACETAMINOPHEN 325 MG PO TABS
650.0000 mg | ORAL_TABLET | Freq: Once | ORAL | Status: AC
  Administered 2018-06-27: 650 mg via ORAL
  Filled 2018-06-27: qty 2

## 2018-06-27 MED ORDER — MECLIZINE HCL 25 MG PO TABS: 25.0000 mg | ORAL_TABLET | Freq: Three times a day (TID) | ORAL | 0 refills | Status: DC | PRN

## 2018-06-27 MED ORDER — LORAZEPAM 1 MG PO TABS: 1.0000 mg | ORAL_TABLET | Freq: Three times a day (TID) | ORAL | 0 refills | Status: DC | PRN

## 2018-06-27 MED ORDER — LORAZEPAM 2 MG/ML IJ SOLN
1.0000 mg | Freq: Once | INTRAMUSCULAR | Status: DC
  Filled 2018-06-27 (×2): qty 1

## 2018-06-27 MED ORDER — LORAZEPAM 2 MG/ML IJ SOLN
1.0000 mg | Freq: Once | INTRAMUSCULAR | Status: AC
  Administered 2018-06-27: 1 mg via INTRAVENOUS

## 2018-06-27 NOTE — ED Provider Notes (Addendum)
[facility] DEPT Provider Note   CSN: [id number] Arrival date & time: 06/27/18  1159     History   Chief Complaint No chief complaint on file.  HPI Kayla Harrison is a 72 y.o. female.  HPI  72 year old female comes in with chief complaint of dizziness. Patient has history of breast cancer, thyroid cancer and non-Hodgkin's lymphoma.  She is no longer getting chemotherapy.  She reports that she also has history of gastric bypass surgery, and yesterday about 2 hours after lunch she woke up feeling nauseated and dizzy.  Patient felt like she was bloated.  Although her bloating resolved, patient continued to have nausea and dizziness.  Patient describes her dizziness as vertigo, and her symptoms are worse when she turns her head.  She has no history of similar symptoms in the past.  Patient denies any associated vision change, numbness, tingling, focal weakness, slurred speech.  She has had stroke in the past with balance issues, and her symptoms this time around are completely different than her stroke ataxia.  Past Medical History:  Diagnosis Date  . Anemia   . Arthritis    osteoarthritis - knees and right shoulder  . Atrial fibrillation [facility])    ablation- 2x's-- 1st time- [facility], 2nd event at [facility] in 2008. Convergent ablation at [facility] 6/14  . Atrial fibrillation ([place])   . Blood transfusion without reported diagnosis   . Breast cancer ([place])    Dr Margot Chimes, total thyroidectomy- 1999- for cancer  . Chronic bilateral pleural effusions   . Colon polyp    Dr Earlean Shawl  . Complete heart block ([place])   . Complication of anesthesia    Ketamine produces LSD reaction, bright colored nightmarish experience   . Dyslipidemia   . Dyspnea   . H/O pleural effusion    s/p thoracentesis w 3279m withdrawn  . Hepatitis    [place]hepatitis   . History of dysphagia    due to radiation therapy  . History of hiatal hernia    small noted on PET scan  . History of kidney stones   . Hx of thyroid cancer    Dr SForde Dandy . Hyperlipidemia   . Hypothyroidism   . Lung cancer, lower lobe ([place] 01[place]   radiation RX completed 03/04/17; will start chemo 6/27, pt unaware of lung cancer  . Morbid obesity ([place]    Status post lap band surgery  . [place] Hodgkin's lymphoma ([place]    on chemotherapy  . Persistent atrial fibrillation ([place]    a. s/p PVI 2008 b. s/p convergent ablation 24098complicated by bradycardia requiring pacemaker implant  . Presence of permanent cardiac pacemaker   . Rotator cuff tear    Right  . Sinus node dysfunction ([place]    Complicating convergent ablation 6/14  . [place]    [place]  . SVC syndrome     Patient Active Problem List   Diagnosis Date Noted  . S/P reverse total shoulder arthroplasty, 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  . Persistent atrial fibrillation   . Bilateral ureteral calculi 11/17/2017  . Atrial fibrillation with RVR ([place] 09/15/2017  . Atrial flutter ([place] 07/17/2017  . Port catheter in place 04/02/2017  . Non-Hodgkin lymphoma, unspecified, intrathoracic lymph nodes ([place] 02/13/2017  . Mediastinal mass 02/06/2017  . 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 Abnormal chest x-ray   . Lung mass 02/03/2017  . 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  .  OSA (obstructive sleep apnea) 02/03/2015  . History of renal calculi 11/16/2014  . Renal calculi 11/16/2014  . Family history of colon cancer 10/26/2014  . Mechanical complication due to cardiac pacemaker pulse generator 11/06/2013  . Dyspnea 05/12/2013  . Hypothyroidism 04/21/2013  . Pleural effusion 04/19/2013  . Long term (current) use of anticoagulants 12/20/2010  . EPIDERMOID CYST 08/22/2010  . Chronic diastolic heart failure ([place]) 08/06/2010  . SYNCOPE AND COLLAPSE 07/27/2010  . OSTEOARTHRITIS, KNEE, RIGHT 03/15/2010  . Morbid obesity ([place]) 12/07/2009  . NONSPEC ELEVATION OF LEVELS OF TRANSAMINASE/LDH  09/08/2009  . BREAST CANCER, HX OF 07/25/2009  . COLONIC POLYPS, HX OF 07/25/2009  . TUBULOVILLOUS ADENOMA, COLON 04/29/2008  . HYPERGLYCEMIA, FASTING 04/29/2008  . HYPERLIPIDEMIA 06/22/2007  . CARCINOMA, THYROID GLAND, HX OF 06/22/2007    Past Surgical History:  Procedure Laterality Date  . ABDOMINAL HYSTERECTOMY  1983  . afib ablation     a. 2008 PVI b. 2014 convergent ablation  . APPENDECTOMY    . BONE MARROW BIOPSY  02/21/2017  . BREAST LUMPECTOMY  2010  . bso  1998  . CARDIAC CATHETERIZATION     2015- negative  . CARDIOVERSION  10/09/2012   Procedure: CARDIOVERSION;  Surgeon: Minus Breeding, MD;  Location: [place];  Service: Cardiovascular;  Laterality: N/A;  . CARDIOVERSION  10/09/2012   Procedure: CARDIOVERSION;  Surgeon: Minus Breeding, MD;  Location: [facility] ENDOSCOPY;  Service: Cardiovascular;  Laterality: N/A;  [name] gave the ok to add pt to the add on , but we must check to find out if the can add pt on at [phone] ( [phone])  . CARDIOVERSION N/A 11/20/2012   Procedure: CARDIOVERSION;  Surgeon: Fay Records, MD;  Location: [facility] ENDOSCOPY;  Service: Cardiovascular;  Laterality: N/A;  . CARDIOVERSION N/A 07/18/2017   Procedure: CARDIOVERSION;  Surgeon: Thayer Headings, MD;  Location: [facility] ENDOSCOPY;  Service: Cardiovascular;  Laterality: N/A;  . CARDIOVERSION N/A 10/03/2017   Procedure: CARDIOVERSION;  Surgeon: Sanda Klein, MD;  Location: [facility] ENDOSCOPY;  Service: Cardiovascular;  Laterality: N/A;  . CARDIOVERSION N/A 01/07/2018   Procedure: CARDIOVERSION;  Surgeon: Acie Fredrickson Wonda Cheng, MD;  Location: [place];  Service: Cardiovascular;  Laterality: N/A;  . CHOLECYSTECTOMY    . COLONOSCOPY W/ POLYPECTOMY     Dr Earlean Shawl  . CYSTOSCOPY N/A 02/06/2015   Procedure: CYSTOSCOPY;  Surgeon: Kathie Rhodes, MD;  [facility];  Service: Urology;  Laterality: N/A;  . CYSTOSCOPY W/ RETROGRADES Left 11/17/2017   Procedure: CYSTOSCOPY WITH RETROGRADE /PYELOGRAM/;  Surgeon: Kathie Rhodes, MD;  Location:  [facility];  Service: Urology;  Laterality: Left;  . CYSTOSCOPY WITH RETROGRADE PYELOGRAM, URETEROSCOPY AND STENT PLACEMENT Right 02/06/2015   Procedure: RETROGRADE PYELOGRAM, RIGHT URETEROSCOPY STENT PLACEMENT;  Surgeon: Kathie Rhodes, MD;  [facility];  Service: Urology;  Laterality: Right;  . CYSTOSCOPY WITH RETROGRADE PYELOGRAM, URETEROSCOPY AND STENT PLACEMENT Right 03/07/2017   Procedure: CYSTOSCOPY WITH RIGHT RETROGRADE PYELOGRAM,RIGHT  URETEROSCOPYLASER LITHOTRIPSY  AND STENT PLACEMENT AND STONE BASKETRY;  Surgeon: Kathie Rhodes, MD;  Location: [place];  Service: Urology;  Laterality: Right;  . EYE SURGERY     cataract surgery  . HOLMIUM LASER APPLICATION N/A 2/95/1884   Procedure: HOLMIUM LASER APPLICATION;  Surgeon: Kathie Rhodes, MD;  [facility];  Service: Urology;  Laterality: N/A;  . HOLMIUM LASER APPLICATION Right 1/66/0630   Procedure: HOLMIUM LASER APPLICATION;  Surgeon: Kathie Rhodes, MD;  Location: [facility];  Service: Urology;  Laterality: Right;  .  HOLMIUM LASER APPLICATION Left 3/66/2947   Procedure: HOLMIUM LASER APPLICATION;  Surgeon: Kathie Rhodes, MD;  [facility];  Service: Urology;  Laterality: Left;  . IR FLUORO GUIDE PORT INSERTION RIGHT  02/24/2017  . IR NEPHROSTOMY PLACEMENT RIGHT  11/17/2017  . IR PATIENT EVAL TECH 0-60 MINS  03/11/2017  . IR REMOVAL TUN ACCESS W/ PORT W/O FL MOD SED  04/20/2018  . IR US GUIDE VASC ACCESS RIGHT  02/24/2017  . KNEE ARTHROSCOPY     bilateral  . LAPAROSCOPIC GASTRIC BANDING  07/10/2010  . LAPAROTOMY     for ruptured ovary and ovarian artery   . NEPHROLITHOTOMY Right 11/17/2017   Procedure: NEPHROLITHOTOMY PERCUTANEOUS;  Surgeon: Kathie Rhodes, MD;  [facility];  Service: Urology;  Laterality: Right;  . PACEMAKER INSERTION  03/10/2013   MDT dual chamber PPM  . POCKET REVISION N/A 12/08/2013   Procedure: POCKET REVISION;  Surgeon: Deboraha Sprang, MD;  Location[facility] CATH LAB;  Service:  Cardiovascular;  Laterality: N/A;  . PORTA CATH INSERTION    . REVERSE SHOULDER ARTHROPLASTY Right 05/14/2018   Procedure: RIGHT REVERSE SHOULDER ARTHROPLASTY;  Surgeon: Tania Ade, MD;  Location: [place];  Service: Orthopedics;  Laterality: Right;  . TEE WITH CARDIOVERSION  09/22/2017  . TEE WITHOUT CARDIOVERSION N/A 10/03/2017   Procedure: TRANSESOPHAGEAL ECHOCARDIOGRAM (TEE);  Surgeon: Sanda Klein, MD;  Location: [place];  Service: Cardiovascular;  Laterality: N/A;  . THYROIDECTOMY  1998   Dr Margot Chimes  . TONSILLECTOMY    . TOTAL KNEE ARTHROPLASTY  04/13/2012   Procedure: TOTAL KNEE ARTHROPLASTY;  Surgeon: Rudean Haskell, MD;  Location: [place];  Service: Orthopedics;  Laterality: Right;  Marland Kitchen VIDEO BRONCHOSCOPY WITH ENDOBRONCHIAL ULTRASOUND N/A 02/07/2017   Procedure: VIDEO BRONCHOSCOPY WITH ENDOBRONCHIAL ULTRASOUND;  Surgeon: Marshell Garfinkel, MD;  Location: [place];  Service: Pulmonary;  Laterality: N/A;     OB History   None      Home Medications    Prior to Admission medications   Medication Sig Start Date End Date Taking? Authorizing Provider  acetaminophen (TYLENOL) 500 MG tablet Take 1,000 mg by mouth at bedtime.    Yes [provider]  amiodarone (PACERONE) 200 MG tablet Take 1 tablet (200 mg total) by mouth daily. 04/23/18  Yes Sherran Needs, NP  ELIQUIS 5 MG TABS tablet TAKE 1 TABLET BY MOUTH TWO  TIMES DAILY 02/24/18  Yes Deboraha Sprang, MD  furosemide (LASIX) 20 MG tablet Take 1 tablets daily till you see your cardiolgist Patient taking differently: Take 20 mg by mouth daily as needed for fluid or edema.  09/17/17  Yes Bonnielee Haff, MD  levothyroxine (SYNTHROID, LEVOTHROID) 175 MCG tablet Take 175 mcg by mouth See admin instructions. TAKE 1 TABLET (175 MCG) BY MOUTH DAILY, EXCEPT FRIDAYS.   Yes [provider]  Lifitegrast Shirley Friar) 5 % SOLN Apply 1 drop to eye at bedtime.    Yes [provider]  Multiple Vitamin (MULTIVITAMIN) tablet Take  1 tablet by mouth daily.    Yes [provider]  rosuvastatin (CRESTOR) 10 MG tablet Take 10 mg by mouth every Wednesday. In the morning   Yes [provider]  traMADol (ULTRAM) 50 MG tablet Take 50 mg by mouth 2 (two) times daily as needed (for moderate shoulder pain.).   Yes [provider]  meclizine (ANTIVERT) 25 MG tablet Take 1 tablet (25 mg total) by mouth 3 (three) times daily as needed for dizziness. 06/27/18   Amnah Breuer,  Sibyl Mikula, MD  oxyCODONE-acetaminophen (PERCOCET/ROXICET) 5-325 MG tablet Take 1-2 tablets by mouth every 4 (four) hours as needed (for severe pain.). Patient not taking: Reported on 06/27/2018 05/14/18   Grier Mitts, PA-C    Family History Family History  Problem Relation Age of Onset  . Heart disease Father 52       MI '@autopsy'$   . Colon cancer Father        COLON  . Heart attack Father   . Diabetes Sister   . Diabetes Brother   . Diabetes Paternal Aunt   . Diabetes Paternal Grandmother     Social History Social History   Tobacco Use  . Smoking status: Never Smoker  . Smokeless tobacco: Never Used  Substance Use Topics  . Alcohol use: No  . Drug use: No     Allergies   Dofetilide; Rivaroxaban; Epinephrine; Adhesive [tape]; and Ketamine   Review of Systems Review of Systems  Constitutional: Positive for activity change.  Neurological: Positive for dizziness.  All other systems reviewed and are negative.    Physical Exam Updated Vital Signs BP (!) 173/97 (BP Location: Left Arm)   Pulse 78   Temp 98.3 F (36.8 C) (Oral)   Resp 16   Ht '5\' 6"'$  (1.676 m)   Wt 90.3 kg   SpO2 100%   BMI 32.12 kg/m   Physical Exam  Constitutional: She is oriented to person, place, and time. She appears well-developed.  HENT:  Head: Normocephalic and atraumatic.  Eyes: Pupils are equal, round, and reactive to light. EOM are normal.  No nystagmus appreciated.  Neck: Normal range of motion. Neck supple.  Cardiovascular: Normal  rate.  Pulmonary/Chest: Effort normal.  Abdominal: Bowel sounds are normal.  Neurological: She is alert and oriented to person, place, and time.  Cerebellar exam is normal (finger to nose) Sensory exam normal for bilateral upper and lower extremities - and patient is able to discriminate between sharp and dull. Motor exam is 4+/5  Symptoms of vertigo are reproduced with change in gaze and also with turning of the head   Skin: Skin is warm and dry.  Nursing note and vitals reviewed.    ED Treatments / Results  Labs (all labs ordered are listed, but only abnormal results are displayed) Labs Reviewed  CBC WITH DIFFERENTIAL/PLATELET  BASIC METABOLIC PANEL    EKG None  Radiology No results found.  Procedures Procedures (including critical care time)  Medications Ordered in ED Medications  sodium chloride 0.9 % bolus 1,000 mL (has no administration in time range)  metoCLOPramide (REGLAN) injection 10 mg (has no administration in time range)  acetaminophen (TYLENOL) tablet 650 mg (has no administration in time range)  LORazepam (ATIVAN) injection 1 mg (1 mg Intravenous Given 06/27/18 1644)  meclizine (ANTIVERT) tablet 50 mg (50 mg Oral Given 06/27/18 1439)     Initial Impression / Assessment and Plan / ED Course  I have reviewed the triage vital signs and the nursing notes.  Pertinent labs & imaging results that were available during my care of the patient were reviewed by me and considered in my medical decision making (see chart for details).  Clinical Course as of Jun 27 1733  Sat Jun 27, 2018  1718 Labs are reassuring. Patient's vertigo has improved dramatically.  She is able to move her eyes and her face without significant dizziness.  Patient's blood pressure has been high.  She reports that she does not have any history of elevated blood pressure.  I am not sure what the underlying process is for the elevated blood pressure, but we do not think there is a perfusion  related issue.  We have advised her to follow-up with her PCP for the blood pressure. Patient does have a moderate headache, will give her Tylenol and Reglan to see if that helps her with her blood pressure.  She has been advised to follow-up with the neurologist.  Patient is comfortable with the plan and she agrees to the strict ER return precautions.   [AN]  1732 Patient has a 5 or 6 out of 10 headache.  Tylenol and IV Reglan given.  Patient has refused CT head, stating that she does not think she has a bleed.  I also do not think she has a bleed.  I informed her that she might need to see a neurologist if her headaches persist and they might be interested in getting further studies, and she is okay with that.   [AN]    Clinical Course User Index [AN] Varney Biles, MD    DDx includes:  Peripheral Vertigo:  BPPV  Vestibular neuritis  Meniere disease  Migrainous vertigo  Ear Infection   72 year old female comes in with chief complaint of vertigo.  She also has associated nausea.  Her abdominal exam is negative for any acute findings.  Based on history and exam it seems like patient is having peripheral vertigo.  We will give her oral Antivert and IV Ativan and reassess.. Orthostatics also ordered.  Final Clinical Impressions(s) / ED Diagnoses   Final diagnoses:  Vertigo  Elevated BP without diagnosis of hypertension    ED Discharge Orders         Ordered    meclizine (ANTIVERT) 25 MG tablet  3 times daily PRN     06/27/18 1733           Varney Biles, MD 06/27/18 1723    Varney Biles, MD 06/27/18 1734

## 2018-06-27 NOTE — ED Notes (Addendum)
While doing orthostatic vital signs, patient's spO2 dropped to 80% during the 3 minute standing portion. After lying back down it went up to 95%.

## 2018-06-27 NOTE — ED Notes (Signed)
Bed: YW73 Expected date: 06/27/18 Expected time: 11:56 AM Means of arrival: Ambulance Comments: N/V

## 2018-06-27 NOTE — ED Triage Notes (Signed)
Patient has hx breast cancer, thyroid cancer, non hodgkins lymphoma, last chemo treatment was 06/2017

## 2018-06-27 NOTE — ED Triage Notes (Signed)
Pt comes from home via [facility]. Reports hx of gastric bypass, ate eggplant parm for lunch 06/26/18. Took nap afterwards and when she woke up she felt nauseated and dizzy. Reports vomiting. Denies pain. Feels dizzy when she turns her head from side to side, keeping her eyes closed makes her feel better. Denies hx of vertigo. Hx of A-fib currently on Eliquis daily but missed her morning dose. Denies fever, headache, chills. No recent infections. Had right shoulder replacement 6 weeks ago.

## 2018-06-27 NOTE — ED Provider Notes (Signed)
7:23 PM Assumed care from Dr. Kathrynn Humble, please see their note for full history, physical and decision making until this point. In brief this is a 72 y.o. year old female who presented to the ED tonight with No chief complaint on file.     Previously decided not to do any imaging for this is a seems very likely to be peripheral vertigo.  Patient is pending labs and reevaluation to ensure improvement symptoms.  Personally witnessed the patient apartment more than once without any difficulty.  Patient states she is been asymptomatic but is little bit sleepy from the medicine.  She does request that she get a prescription for Ativan in case the meclizine does not work.  I discussed not driving while she is on his medic.  She will take them only as needed.  She will not take them within 2 hours of each other.  She will not drive until she has gone at least a couple days without vertigo.  Discharge instructions, including strict return precautions for new or worsening symptoms, given. Patient and/or family verbalized understanding and agreement with the plan as described.   Labs, studies and imaging reviewed by myself and considered in medical decision making if ordered. Imaging interpreted by radiology.  Labs Reviewed  CBC WITH DIFFERENTIAL/PLATELET  BASIC METABOLIC PANEL    No orders to display    No follow-ups on file.    Merrily Pew, MD 06/27/18 [phone]

## 2018-06-27 NOTE — Discharge Instructions (Addendum)
We suspect that your dizziness is because of peripheral vertigo. Please perform the Apley's maneuver that are printed in your discharge paperwork.  Take the medications prescribed and see your neurologist. Do not take the medications within 2 hours of each other.   Given that you are on blood thinner, be very careful when you are walking with this condition so that you do not end of falling and hurting herself further.  Also have your neurologist see you for headaches if they continue and see your primary care doctor if your blood pressure continues to be high.  Please return to the ER if the headache gets severe and in not improving, you have associated new one sided numbness, tingling, weakness or confusion, seizures, worsening dizziness.

## 2018-06-27 NOTE — ED Notes (Signed)
Patient's ativan was pulled from pyxis and into syringe. I laid it on bedside table in 70mL syringe. I was unable to start IV. I left ativan on the bedside table. Two coworkers attempted to start IV without success. IV team was called to start IV. Once the IV team got the IV in and bloodwork drawn, I went to push the ativan. Ativan no longer on the bedside table. Patient insisted no one had put in her IV prior to my arrival. I spoke with both coworkers who attempted IV and neither pushed ativan. I searched room- floor, trash, sink .Marland Kitchen Cannot find the syringe with ativan. Spoke with provider regarding this issue. Was given approval to administer dose at this time.

## 2018-07-06 ENCOUNTER — Ambulatory Visit: Payer: Medicare Other | Admitting: Neurology

## 2018-07-06 ENCOUNTER — Encounter: Payer: Self-pay | Admitting: *Deleted

## 2018-07-09 ENCOUNTER — Encounter: Payer: Self-pay | Admitting: *Deleted

## 2018-07-09 ENCOUNTER — Ambulatory Visit (INDEPENDENT_AMBULATORY_CARE_PROVIDER_SITE_OTHER): Payer: Medicare Other | Admitting: Neurology

## 2018-07-09 ENCOUNTER — Encounter: Payer: Self-pay | Admitting: Neurology

## 2018-07-09 VITALS — BP 113/70 | HR 81 | Ht 66.0 in | Wt 200.0 lb

## 2018-07-09 DIAGNOSIS — G1221 Amyotrophic lateral sclerosis: Secondary | ICD-10-CM

## 2018-07-09 DIAGNOSIS — Z853 Personal history of malignant neoplasm of breast: Secondary | ICD-10-CM | POA: Diagnosis not present

## 2018-07-09 DIAGNOSIS — M6281 Muscle weakness (generalized): Secondary | ICD-10-CM | POA: Diagnosis not present

## 2018-07-09 DIAGNOSIS — I7771 Dissection of carotid artery: Secondary | ICD-10-CM | POA: Diagnosis not present

## 2018-07-09 DIAGNOSIS — G603 Idiopathic progressive neuropathy: Secondary | ICD-10-CM

## 2018-07-09 DIAGNOSIS — R270 Ataxia, unspecified: Secondary | ICD-10-CM

## 2018-07-09 DIAGNOSIS — G20 Parkinson's disease: Secondary | ICD-10-CM

## 2018-07-09 DIAGNOSIS — Z85850 Personal history of malignant neoplasm of thyroid: Secondary | ICD-10-CM

## 2018-07-09 DIAGNOSIS — G3281 Cerebellar ataxia in diseases classified elsewhere: Secondary | ICD-10-CM | POA: Diagnosis not present

## 2018-07-09 DIAGNOSIS — I6389 Other cerebral infarction: Secondary | ICD-10-CM

## 2018-07-09 DIAGNOSIS — W19XXXA Unspecified fall, initial encounter: Secondary | ICD-10-CM

## 2018-07-09 DIAGNOSIS — G902 Horner's syndrome: Secondary | ICD-10-CM | POA: Diagnosis not present

## 2018-07-09 DIAGNOSIS — R269 Unspecified abnormalities of gait and mobility: Secondary | ICD-10-CM

## 2018-07-09 DIAGNOSIS — G1229 Other motor neuron disease: Secondary | ICD-10-CM

## 2018-07-09 DIAGNOSIS — R42 Dizziness and giddiness: Secondary | ICD-10-CM

## 2018-07-09 DIAGNOSIS — G959 Disease of spinal cord, unspecified: Secondary | ICD-10-CM

## 2018-07-09 NOTE — Progress Notes (Signed)
[facility]    Provider:  Dr Jaynee Eagles Referring Provider: [name], MD Primary Care Physician:  [name], MD  CC:  Dizziness  HPI:  Kayla Harrison is a lovely 72 y.o. female here as requested by Dr. Forde Dandy for dizziness and gait abnormality.  Past medical history osteoarthritis, atrial fibrillation status post ablation complicated by bradycardia requiring pacemaker implant, breast cancer, total thyroidectomy 1999 for cancer, complete heart block, dyslipidemia, dyspnea, history of pleural effusion, hyperlipidemia, morbid obesity status post lap band surgery, non-Hodgkin's lymphoma, stroke, osa. Long term use of anticoagulants Eliquis. Vertigo started approx 2 weeks ago. No inciting events, no prior illnesses. She woke up with it, she sat up and the world was "going every which way". +vomiting. She does not ave a hx of migraines. No other episodes in the past. No focal neuro deficits other than the vertigo during the event. If she sat still and didn;t turn her head she was ok. Also had a headache. Movement worsened vertigo. Episodic vertigo for 4 days. She went to the emergency room. Meclizine helped but made her sleepy. She felt like she was moving and spinning, it was one of the worst experiences of her life. Epley maneuvers helped. Other concerns are gait abnormality and lightheadedness when head extension ongoing for years. Reports progressive Gait abnormality, weakness of the lower extremities, cant pick her feet up, significant imbalance, can't walk long distances without sitting, +neck and low back pain, falls.   Reviewed notes, labs and imaging from outside physicians, which showed:  Reviewed emergency room notes.  Patient was seen June 27, 2018 with a complaint of dizziness.  Patient felt bloated and she continued to have nausea and dizziness even after that resolved.  She describes her dizziness as vertigo and worse when turning her head.  She has no history  of similar symptoms in the past, there was no associated vision change, numbness, tingling, focal weakness or slurred speech.  She has a history of stroke in the past with balance issues but symptoms were not similar. No nystagmus was seen on examination.Symptoms of vertigo were reproduced with change in gaze and with turning of head.  Reviewed Dr. Baldwin Crown referring notes. HgbA1c 5.2. Given symptoms Dr. Forde Dandy diagnosed BPPV. BMP 06/27/2018 BUN 14 and creatinine 0.76.   Reviewed images MRI brain in 2004 and CT head that showed moderate to advanced white matter changes.  Review of Systems: Patient complains of symptoms per HPI as well as the following symptoms: vertigo. Pertinent negatives and positives per HPI. All others negative.   Social History   Socioeconomic History  . Marital status: Single    Spouse name: Not on file  . Number of children: 0  . Years of education: Not on file  . Highest education level: Master's degree (e.g., MA, MS, MEng, MEd, MSW, MBA)  Occupational History  . 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    Employer: RETIRED  Social Needs  . Financial resource strain: Not on file  . Food insecurity:    Worry: Not on file    Inability: Not on file  . Transportation needs:    Medical: Not on file    Non-medical: Not on file  Tobacco Use  . Smoking status: Never Smoker  . Smokeless tobacco: Never Used  Substance and Sexual Activity  . Alcohol use: No    Comment: none since 1990  . Drug use: Never  . Sexual activity: Never    Birth control/protection: Surgical  Lifestyle  .  Physical activity:    Days per week: Not on file    Minutes per session: Not on file  . Stress: Not on file  Relationships  . Social connections:    Talks on phone: Not on file    Gets together: Not on file    Attends religious service: Not on file    Active member of club or organization: Not on file    Attends meetings of clubs or organizations: Not on file     Relationship status: Not on file  . Intimate partner violence:    Fear of current or ex partner: Not on file    Emotionally abused: Not on file    Physically abused: Not on file    Forced sexual activity: Not on file  Other Topics Concern  . Not on file  Social History Narrative   SINGLE    NO CHILDREN   NEVER SMOKED   EXERCISE 3 X WK   RETIRED   NO CAFFEINE          Family History  Problem Relation Age of Onset  . Heart disease Father 25       MI '@autopsy'$   . Colon cancer Father        COLON  . Heart attack Father   . Other Mother        temporal arteritis   . Diabetes Sister   . Diabetes Brother   . Diabetes Paternal Aunt   . Diabetes Paternal Grandmother     Past Medical History:  Diagnosis Date  . Anemia   . Arthritis    osteoarthritis - knees and right shoulder  . Atrial fibrillation [facility])    ablation- 2x's-- 1st time- [facility], 2nd event at [facility] in 2008. Convergent ablation at [facility] 6/14  . Atrial fibrillation ([place])   . Blood transfusion without reported diagnosis   . Breast cancer ([place])    Dr Margot Chimes, total thyroidectomy- 1999- for cancer  . 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 Chronic bilateral pleural effusions   . Colon polyp    Dr Earlean Shawl  . Complete heart block ([place])   . Complication of anesthesia    Ketamine produces LSD reaction, bright colored nightmarish experience   . Dyslipidemia   . Dyspnea   . Endometriosis   . Fibroids   . H/O pleural effusion    s/p thoracentesis w 325m withdrawn  . Hepatitis    [place] ?hepatitis   . History of dysphagia    due to radiation therapy  . History of hiatal hernia    small noted on PET scan  . History of kidney stones   . Hx of thyroid cancer    Dr SForde Dandy . Hyperlipidemia   . Hypertension   . Hypothyroidism   . Lung cancer, lower lobe ([facility] 01/2017   radiation RX completed 03/04/17; will start chemo 6/27, pt unaware of lung cancer  . Morbid [place] ([place]    Status post lap  band surgery  . Nephrolithiasis   . [place] Hodgkin's lymphoma ([place]    on chemotherapy  . Persistent atrial fibrillation    a. s/p PVI 2008 b. s/p convergent ablation 24098complicated by bradycardia requiring pacemaker implant  . Presence of permanent cardiac pacemaker   . Rotator cuff tear    Right  . Sinus node dysfunction ([place]    Complicating convergent ablation 6/14  . St[place] ([facility]    [place]  . SVC syndrome  with lung mass and non hodgkins lymphoma  . Thyroid cancer ([place]) 2000    Past Surgical History:  Procedure Laterality Date  . ABDOMINAL HYSTERECTOMY  1983  . afib ablation     a. 2008 PVI b. 2014 convergent ablation  . APPENDECTOMY    . BONE MARROW BIOPSY  02/21/2017  . BREAST LUMPECTOMY Left 2010  . bso  1998  . CARDIAC CATHETERIZATION     2015- negative  . CARDIOVERSION  10/09/2012   Procedure: CARDIOVERSION;  Surgeon: Minus Breeding, MD;  Location: [place];  Service: Cardiovascular;  Laterality: N/A;  . CARDIOVERSION  10/09/2012   Procedure: CARDIOVERSION;  Surgeon: Minus Breeding, MD;  Location: [facility] ENDOSCOPY;  Service: Cardiovascular;  Laterality: N/A;  [name] gave the ok to add pt to the add on , but we must check to find out if the can add pt on at [phone] ( [phone])  . CARDIOVERSION N/A 11/20/2012   Procedure: CARDIOVERSION;  Surgeon: Fay Records, MD;  Location: [facility] ENDOSCOPY;  Service: Cardiovascular;  Laterality: N/A;  . CARDIOVERSION N/A 07/18/2017   Procedure: CARDIOVERSION;  Surgeon: Thayer Headings, MD;  Location: [facility] ENDOSCOPY;  Service: Cardiovascular;  Laterality: N/A;  . CARDIOVERSION N/A 10/03/2017   Procedure: CARDIOVERSION;  Surgeon: Sanda Klein, MD;  Location: [facility] ENDOSCOPY;  Service: Cardiovascular;  Laterality: N/A;  . CARDIOVERSION N/A 01/07/2018   Procedure: CARDIOVERSION;  Surgeon: Acie Fredrickson Wonda Cheng, MD;  Location: [place];  Service: Cardiovascular;  Laterality: N/A;  . CHOLECYSTECTOMY    . COLONOSCOPY W/ POLYPECTOMY     Dr Earlean Shawl  .  CYSTOSCOPY N/A 02/06/2015   Procedure: CYSTOSCOPY;  Surgeon: Kathie Rhodes, MD;  [facility];  Service: Urology;  Laterality: N/A;  . CYSTOSCOPY W/ RETROGRADES Left 11/17/2017   Procedure: CYSTOSCOPY WITH RETROGRADE /PYELOGRAM/;  Surgeon: Kathie Rhodes, MD;  [facility];  Service: Urology;  Laterality: Left;  . CYSTOSCOPY WITH RETROGRADE PYELOGRAM, URETEROSCOPY AND STENT PLACEMENT Right 02/06/2015   Procedure: RETROGRADE PYELOGRAM, RIGHT URETEROSCOPY STENT PLACEMENT;  Surgeon: Kathie Rhodes, MD;  [facility];  Service: Urology;  Laterality: Right;  . CYSTOSCOPY WITH RETROGRADE PYELOGRAM, URETEROSCOPY AND STENT PLACEMENT Right 03/07/2017   Procedure: CYSTOSCOPY WITH RIGHT RETROGRADE PYELOGRAM,RIGHT  URETEROSCOPYLASER LITHOTRIPSY  AND STENT PLACEMENT AND STONE BASKETRY;  Surgeon: Kathie Rhodes, MD;  Location: [place];  Service: Urology;  Laterality: Right;  . EYE SURGERY     cataract surgery  . fatty mass removal  1999   pubic area  . HOLMIUM LASER APPLICATION N/A 05/17/538   Procedure: HOLMIUM LASER APPLICATION;  Surgeon: Kathie Rhodes, MD;  [facility];  Service: Urology;  Laterality: N/A;  . HOLMIUM LASER APPLICATION Right 7/67/3419   Procedure: HOLMIUM LASER APPLICATION;  Surgeon: Kathie Rhodes, MD;  Location: [facility];  Service: Urology;  Laterality: Right;  . HOLMIUM LASER APPLICATION Left 3/79/0240   Procedure: HOLMIUM LASER APPLICATION;  Surgeon: Kathie Rhodes, MD;  [facility];  Service: Urology;  Laterality: Left;  . IR FLUORO GUIDE PORT INSERTION RIGHT  02/24/2017  . IR NEPHROSTOMY PLACEMENT RIGHT  11/17/2017  . IR PATIENT EVAL TECH 0-60 MINS  03/11/2017  . IR REMOVAL TUN ACCESS W/ PORT W/O FL MOD SED  04/20/2018  . IR US GUIDE VASC ACCESS RIGHT  02/24/2017  . KNEE ARTHROSCOPY     bilateral  . LAPAROSCOPIC GASTRIC BANDING  07/10/2010  . LAPAROSCOPIC GASTRIC BANDING     Laparoscopic adjustable banding APS System with posterior hiatal  hernia, 2 suture.  Marland Kitchen LAPAROTOMY     for ruptured ovary and ovarian artery   . NEPHROLITHOTOMY Right 11/17/2017   Procedure: NEPHROLITHOTOMY PERCUTANEOUS;  Surgeon: Kathie Rhodes, MD;  [facility];  Service: Urology;  Laterality: Right;  . PACEMAKER INSERTION  03/10/2013   MDT dual chamber PPM  . POCKET REVISION N/A 12/08/2013   Procedure: POCKET REVISION;  Surgeon: Deboraha Sprang, MD;  Location[facility] C[facility] LAB;  Service: Cardiovascular;  Laterality: N/A;  . PORTA CATH INSERTION    . REVERSE SHOULDER ARTHROPLASTY Right 05/14/2018   Procedure: RIGHT REVERSE SHOULDER ARTHROPLASTY;  Surgeon: Tania Ade, MD;  Location: [place];  Service: Orthopedics;  Laterality: Right;  . REVERSE SHOULDER REPLACEMENT Right 05/14/2018  . TEE WITH CARDIOVERSION  09/22/2017  . TEE WITHOUT CARDIOVERSION N/A 10/03/2017   Procedure: TRANSESOPHAGEAL ECHOCARDIOGRAM (TEE);  Surgeon: Sanda Klein, MD;  Location: [place];  Service: Cardiovascular;  Laterality: N/A;  . THYROIDECTOMY  1998   Dr Margot Chimes  . TONSILLECTOMY    . TOTAL KNEE ARTHROPLASTY  04/13/2012   Procedure: TOTAL KNEE ARTHROPLASTY;  Surgeon: Rudean Haskell, MD;  Location: [place];  Service: Orthopedics;  Laterality: Right;  Marland Kitchen VIDEO BRONCHOSCOPY WITH ENDOBRONCHIAL ULTRASOUND N/A 02/07/2017   Procedure: VIDEO BRONCHOSCOPY WITH ENDOBRONCHIAL ULTRASOUND;  Surgeon: Marshell Garfinkel, MD;  Location: [place];  Service: Pulmonary;  Laterality: N/A;    Current Outpatient Medications  Medication Sig Dispense Refill  . acetaminophen (TYLENOL) 500 MG tablet Take 1,000 mg by mouth at bedtime.     Marland Kitchen amiodarone (PACERONE) 200 MG tablet Take 1 tablet (200 mg total) by mouth daily. 90 tablet 3  . ELIQUIS 5 MG TABS tablet TAKE 1 TABLET BY MOUTH TWO  TIMES DAILY 180 tablet 2  . furosemide (LASIX) 20 MG tablet Take 1 tablets daily till you see your cardiolgist (Patient taking differently: Take 20 mg by mouth daily as needed for fluid or edema. ) 30 tablet 0  .  levothyroxine (SYNTHROID, LEVOTHROID) 175 MCG tablet Take 175 mcg by mouth See admin instructions. TAKE 1 TABLET (175 MCG) BY MOUTH DAILY, EXCEPT FRIDAYS.    Marland Kitchen Lifitegrast (XIIDRA) 5 % SOLN Apply 1 drop to eye at bedtime.     Marland Kitchen LORazepam (ATIVAN) 1 MG tablet Take 1 tablet (1 mg total) by mouth 3 (three) times daily as needed for anxiety. 15 tablet 0  . meclizine (ANTIVERT) 25 MG tablet Take 1 tablet (25 mg total) by mouth 3 (three) times daily as needed for dizziness. 30 tablet 0  . Multiple Vitamin (MULTIVITAMIN) tablet Take 1 tablet by mouth daily.     . rosuvastatin (CRESTOR) 10 MG tablet Take 10 mg by mouth every Wednesday. In the morning    . traMADol (ULTRAM) 50 MG tablet Take 50 mg by mouth 2 (two) times daily as needed (for moderate shoulder pain.).     No current facility-administered medications for this visit.     Allergies as of 07/09/2018 - Review Complete 07/09/2018  Allergen Reaction Noted  . Dofetilide Other (See Comments) 04/07/2013  . Rivaroxaban Other (See Comments) 02/11/2012  . Epinephrine Rash and Other (See Comments) 12/25/2010  . Adhesive [tape] Rash 02/24/2017  . Ketamine Other (See Comments) 04/16/2013    Vitals: BP 113/70 (BP Location: Left Arm, Patient Position: Sitting)   Pulse 81   Ht '5\' 6"'$  (1.676 m)   Wt 200 lb (90.7 kg)   BMI 32.28 kg/m  Last Weight:  Wt Readings from Last 1 Encounters:  07/09/18 200 lb (90.7 kg)   Last Height:   Ht Readings from Last 1 Encounters:  07/09/18 '5\' 6"'$  (1.676 m)   Physical exam: Exam: Gen: NAD, conversant, well nourised, obese, well groomed                     CV: RRR, no MRG. No Carotid Bruits. No peripheral edema, warm, nontender Eyes: Conjunctivae clear without exudates or hemorrhage  Neuro: Detailed Neurologic Exam  Speech:    Speech is normal; fluent and spontaneous with normal comprehension.  Cognition:    The patient is oriented to person, place, and time;     recent and remote memory intact;      language fluent;     normal attention, concentration,     fund of knowledge Cranial Nerves:    Anisocoria right > left by 65m. Left with miosis and decreased sympathetic innervation. Fundi flat. Visual fields are full to finger confrontation. Extraocular movements are intact. Trigeminal sensation is intact and the muscles of mastication are normal. The face is symmetric. The palate elevates in the midline. Hearing intact. Voice is normal. Shoulder shrug is normal. The tongue has normal motion without fasciculations.   Coordination:    Normal finger to nose and heel to shin. Normal rapid alternating movements.   Gait:    Wide based, shuffling, good arm swing. Lower body parkinsonism.Imbalance, cannot heel/toe/tandem.  Motor Observation:    No asymmetry, no atrophy, and no involuntary movements noted. Tone:    Normal muscle tone.    Posture:    Posture is normal. normal erect    Strength:  Right prox weakness (arthrosis) LE bilateral prox weakness 4/5 .      Sensation: decreased distally in the LE in a gradient fashion. Absent pin prick, vibration and proprioception in the great toes. Sensation improved mid calf. +Rhomberg.     Reflex Exam:  DTR's:    Absent AJs otherwise brisk Toes:    The toes are equivocal bilaterally.   Clonus:    Clonus is absent.     Assessment/Plan: JYASMEEN MANKAis a lovely 72y.o. female here as requested by Dr. SForde Dandyfor dizziness and gait abnormality.  Past medical history osteoarthritis, atrial fibrillation status post ablation complicated by bradycardia requiring pacemaker implant, breast cancer, total thyroidectomy 1999 for cancer, complete heart block, dyslipidemia, dyspnea, history of pleural effusion, hyperlipidemia, morbid obesity status post lap band surgery, non-Hodgkin's lymphoma, stroke, osa. Long term use of anticoagulants Eliquis  (Ordering MRI brain, MRA head and neck, MRI cervical spine. If unremarkable MRI lumbar spine as  below)  Vertigo and possible Horner Syndrome: Vertigo may be BPPV however given her past strokes, malignancies recommended imaging to evaluate for other causes such as stroke, schwannoma, metastatic brain disease and vascular studies for vertebrobasilar insufficiency. Abnormality of pupils needs evaulation for Horner Syndrome. MRI brain w/wo contrast, MRA of the head, MRA of the neck w/wo contrast to look for dissection  Ataxia and Parkinsonism: Patient has lower body parkinsonism. This is commonly seen in vascular parkinsonism which may correspond with the extensive white matter changes on CT of the head and prior MRI brain. Will MRI cervical spine due to brisk reflexes to rule out myelopathy or other cervical cord etiology. If unremarkable for cause of gait abnormality will order an MRI lumbar spine to rule out lumbar spinal stenosis given her claudication symptoms.  Proximal LE weakness: EMG/NCS to rule out Myopathy/Myositis. Given her increased leg circumference the nerve conductions  studies may not be acurate due to technical difficulties so will minimize the NCS and focus on the emg for myopathy/myositis  Significant peripheral neuropathy: Idiopathic, has had chemo in the past may be a factor. Will check B12. Neuropathy is also contributing to her abnormality of gait and imbalance.  Fall risk: discussed fall precautions. She has been to physical therapy.  Orders Placed This Encounter  Procedures  . MR BRAIN W WO CONTRAST  . MR MRA HEAD WO CONTRAST  . MR MRA NECK W WO CONTRAST  . MR CERVICAL SPINE WO CONTRAST  . B12 and Folate Panel  . Methylmalonic acid, serum  . CK  . NCV with EMG(electromyography)      Sarina Ill, MD  [facility] [address] [place], [place] [address]  Phone [phone] Fax [phone]

## 2018-07-09 NOTE — Patient Instructions (Addendum)
MRI brain cervical spine and lumbar spine Lab Emg/ncs  Electromyoneurogram Electromyoneurogram is a test to check how well your muscles and nerves are working. This procedure includes the combined use of electromyogram (EMG) and nerve conduction study (NCS). EMG is used to look for muscular disorders. NCS, which is also called electroneurogram, measures how well your nerves are controlling your muscles. The procedures are usually performed together to check if your muscles and nerves are healthy. If the reaction to testing is abnormal, this can indicate disease or injury, such as peripheral nerve damage. Tell a health care provider about:  Any allergies you have.  All medicines you are taking, including vitamins, herbs, eye drops, creams, and over-the-counter medicines.  Any problems you or family members have had with anesthetic medicines.  Any blood disorders you have.  Any surgeries you have had.  Any medical conditions you have.  Any pacemaker you have. What are the risks? Generally, this is a safe procedure. However, problems may occur, including:  Infection where the electrodes were inserted.  Bleeding.  What happens before the procedure?  Ask your health care provider about: ? Changing or stopping your regular medicines. This is especially important if you are taking diabetes medicines or blood thinners. ? Taking medicines such as aspirin and ibuprofen. These medicines can thin your blood. Do not take these medicines before your procedure if your health care provider instructs you not to.  Your health care provider may ask you to avoid: ? Caffeine, such as coffee and tea. ? Nicotine. This includes cigarettes and anything with tobacco.  Do not use lotions or creams on the same day that you will be having the procedure. What happens during the procedure? For EMG:  Your health care provider will ask you to stay in a position so that he or she can access the muscle that  will be studied. You may be standing, sitting down, or lying down.  You may be given a medicine that numbs the area (local anesthetic).  A very thin needle that has an electrode on it will be inserted into your muscle.  Another small electrode will be placed on your skin near the muscle.  Your health care provider will ask you to continue to remain still.  The electrodes will send a signal that tells about the electrical activity of your muscles. You may see this on a monitor or hear it in the room.  After your muscles have been studied at rest, your health care provider will ask you to contract or flex your muscles. The electrodes will send a signal that tells about the electrical activity of your muscles.  Your health care provider will remove the electrodes and the electrode needles when the procedure is finished. The procedure may vary among health care providers and hospitals. For NCS:  An electrode that records your nerve activity (recording electrode) will be placed on your skin by the muscle that is being studied.  An electrode that is used as a reference (reference electrode) will be placed near the recording electrode.  A paste or gel will be applied to your skin between the recording electrode and the reference electrode.  Your nerve will be stimulated with a mild shock. Your health care provider will measure how much time it takes for your muscle to react.  Your health care provider will remove the electrodes and the gel when the procedure is finished. The procedure may vary among health care providers and hospitals. What happens after the  procedure?  It is your responsibility to obtain your test results. Ask your health care provider or the department performing the test when and how you will get your results.  Your health care provider may: ? Give you medicines for any pain. ? Monitor the insertion sites to make sure that they stop bleeding. This information is not  intended to replace advice given to you by your health care provider. Make sure you discuss any questions you have with your health care provider. Document Released: 01/10/2005 Document Revised: 02/15/2016 Document Reviewed: 10/31/2014 Elsevier Interactive Patient Education  2018 [employer].

## 2018-07-10 ENCOUNTER — Encounter: Payer: Self-pay | Admitting: Neurology

## 2018-07-10 DIAGNOSIS — R269 Unspecified abnormalities of gait and mobility: Secondary | ICD-10-CM | POA: Insufficient documentation

## 2018-07-13 ENCOUNTER — Telehealth: Payer: Self-pay | Admitting: Neurology

## 2018-07-13 LAB — METHYLMALONIC ACID, SERUM: Methylmalonic Acid: 245 nmol/L (ref 0–378)

## 2018-07-13 LAB — B12 AND FOLATE PANEL
Folate: >20 ng/mL (ref >3.0)
VITAMIN B 12: 409 pg/mL (ref 232–1245)

## 2018-07-13 LAB — CK: CK TOTAL: 41 U/L (ref 24–173)

## 2018-07-13 NOTE — Telephone Encounter (Signed)
Patient is aware of this and has GI phone number of [phone] and to call if she has not heard in the next 2-3 business days.

## 2018-07-13 NOTE — Telephone Encounter (Signed)
[place]ic[place] of [place] order sent to GI. No auth they will reach out to the pt to schedule.

## 2018-07-15 DIAGNOSIS — R42 Dizziness and giddiness: Secondary | ICD-10-CM | POA: Diagnosis not present

## 2018-07-15 DIAGNOSIS — H8123 Vestibular neuronitis, bilateral: Secondary | ICD-10-CM | POA: Diagnosis not present

## 2018-07-15 NOTE — Telephone Encounter (Signed)
I have printed the most recently scanned pacemaker information from the media tab in pt's chart and it was placed on Emily's desk.   Manufacturer and Model: Medtronic [id number] Advisa DR MRI Device Type: Pacemaker Serial Number: [id number] H 3.00 V Remaining Longevity: Battery Status: Battery Voltage: OK (RRT = 2.83 V) 76yr, 45mo Implant Date: 03/10/2013 Implant Provider: Deboraha Sprang MD Lead Manufacturer & Model Serial Number Implant Date RA Medtronic [id number] CapSureFix MRI SureScan [id number] 03/10/2013 RV Medtronic [id number] CapSureFix MRI SureScan [id number] 03/10/2013

## 2018-07-15 NOTE — Telephone Encounter (Signed)
[facility] reached out to the pt to schedule MRI's but the patient informed them that she has a pacemaker.. Do you have any information on the pacemaker that she has??

## 2018-07-16 NOTE — Addendum Note (Signed)
Addended by: Sarina Ill B on: 07/16/2018 08:52 PM   Modules accepted: Orders

## 2018-07-16 NOTE — Telephone Encounter (Signed)
Noted thank you

## 2018-07-16 NOTE — Telephone Encounter (Signed)
What do you mean, can't u send the original orders to [place]? Let me know

## 2018-07-16 NOTE — Telephone Encounter (Signed)
No because they have been marked to [place] when the order was sent over there and [name] people will not change the location order on it when it has been marked at another location.

## 2018-07-16 NOTE — Telephone Encounter (Signed)
When you get a chance can you put new orders in for [name].

## 2018-07-16 NOTE — Telephone Encounter (Signed)
Ordered new ones, iselected "external" hope that's ok

## 2018-07-20 NOTE — Telephone Encounter (Signed)
Noted, patient is scheduled to have all 4 exams sedated at [name] on 07/29/18.

## 2018-07-22 ENCOUNTER — Encounter: Payer: Self-pay | Admitting: Hematology and Oncology

## 2018-07-27 ENCOUNTER — Ambulatory Visit (INDEPENDENT_AMBULATORY_CARE_PROVIDER_SITE_OTHER): Payer: Medicare Other | Admitting: *Deleted

## 2018-07-27 DIAGNOSIS — I442 Atrioventricular block, complete: Secondary | ICD-10-CM

## 2018-07-29 ENCOUNTER — Ambulatory Visit (HOSPITAL_COMMUNITY)
Admission: RE | Admit: 2018-07-29 | Discharge: 2018-07-29 | Disposition: A | Discharge status: Home / Self Care | Payer: Medicare Other | Source: Ambulatory Visit | Attending: Neurology | Admitting: Neurology

## 2018-07-29 ENCOUNTER — Telehealth: Payer: Self-pay | Admitting: Neurology

## 2018-07-29 ENCOUNTER — Encounter (HOSPITAL_COMMUNITY): Payer: Self-pay | Admitting: Radiology

## 2018-07-29 DIAGNOSIS — G959 Disease of spinal cord, unspecified: Secondary | ICD-10-CM | POA: Insufficient documentation

## 2018-07-29 DIAGNOSIS — Z85850 Personal history of malignant neoplasm of thyroid: Secondary | ICD-10-CM | POA: Diagnosis not present

## 2018-07-29 DIAGNOSIS — R270 Ataxia, unspecified: Secondary | ICD-10-CM

## 2018-07-29 DIAGNOSIS — G3281 Cerebellar ataxia in diseases classified elsewhere: Secondary | ICD-10-CM

## 2018-07-29 DIAGNOSIS — I7771 Dissection of carotid artery: Secondary | ICD-10-CM | POA: Insufficient documentation

## 2018-07-29 DIAGNOSIS — I739 Peripheral vascular disease, unspecified: Secondary | ICD-10-CM | POA: Insufficient documentation

## 2018-07-29 DIAGNOSIS — G20 Parkinson's disease: Secondary | ICD-10-CM | POA: Insufficient documentation

## 2018-07-29 DIAGNOSIS — M5023 Other cervical disc displacement, cervicothoracic region: Secondary | ICD-10-CM | POA: Diagnosis not present

## 2018-07-29 DIAGNOSIS — I6389 Other cerebral infarction: Secondary | ICD-10-CM | POA: Insufficient documentation

## 2018-07-29 DIAGNOSIS — M4802 Spinal stenosis, cervical region: Secondary | ICD-10-CM | POA: Insufficient documentation

## 2018-07-29 DIAGNOSIS — G1229 Other motor neuron disease: Secondary | ICD-10-CM

## 2018-07-29 DIAGNOSIS — W19XXXA Unspecified fall, initial encounter: Secondary | ICD-10-CM | POA: Diagnosis not present

## 2018-07-29 DIAGNOSIS — G902 Horner's syndrome: Secondary | ICD-10-CM | POA: Insufficient documentation

## 2018-07-29 DIAGNOSIS — G1221 Amyotrophic lateral sclerosis: Secondary | ICD-10-CM

## 2018-07-29 DIAGNOSIS — R269 Unspecified abnormalities of gait and mobility: Secondary | ICD-10-CM | POA: Insufficient documentation

## 2018-07-29 DIAGNOSIS — R9389 Abnormal findings on diagnostic imaging of other specified body structures: Secondary | ICD-10-CM | POA: Insufficient documentation

## 2018-07-29 DIAGNOSIS — M542 Cervicalgia: Secondary | ICD-10-CM | POA: Diagnosis not present

## 2018-07-29 DIAGNOSIS — R42 Dizziness and giddiness: Secondary | ICD-10-CM

## 2018-07-29 DIAGNOSIS — M6281 Muscle weakness (generalized): Secondary | ICD-10-CM

## 2018-07-29 DIAGNOSIS — Z853 Personal history of malignant neoplasm of breast: Secondary | ICD-10-CM | POA: Diagnosis not present

## 2018-07-29 DIAGNOSIS — M50322 Other cervical disc degeneration at C5-C6 level: Secondary | ICD-10-CM | POA: Diagnosis not present

## 2018-07-29 DIAGNOSIS — R2689 Other abnormalities of gait and mobility: Secondary | ICD-10-CM | POA: Diagnosis not present

## 2018-07-29 MED ORDER — GADOBUTROL 1 MMOL/ML IV SOLN
10.0000 mL | Freq: Once | INTRAVENOUS | Status: AC | PRN
  Administered 2018-07-29: 10 mL via INTRAVENOUS

## 2018-07-29 NOTE — Progress Notes (Signed)
Remote pacemaker transmission.   

## 2018-07-29 NOTE — Telephone Encounter (Signed)
Please call: Imaging did not show anything new, no new strokes for example. The MRI of the brain did however show the extensive white matter changes we discussed at last appointment showing advanced vascular disease in the brain which has progressed (this was discussed at length at appointment likely as the cause for her walking problems).The large blood vessels do not show any significant stenoses. She is scheduled for emg/ncs and we can discuss all of this at that appointment.  I suggest checking an MRI of the lumbar spine next. We discussed this at last appointment if we did not find anything new in the above workup. If they agree please let me know so I can order thanks   MRI brain and MRA of the head and neck: 1. No acute intracranial abnormality. No explanation for acute headache or vertigo. 2. Head and Neck MRA appear stable since 2004 and normal for age. 3. Chronic small vessel disease has progressed since 2011, notably interval micro-hemorrhage in the left thalamus. Underlying advanced chronic cerebral white matter signal changes.

## 2018-07-30 ENCOUNTER — Ambulatory Visit (INDEPENDENT_AMBULATORY_CARE_PROVIDER_SITE_OTHER): Payer: Medicare Other | Admitting: Neurology

## 2018-07-30 ENCOUNTER — Encounter: Payer: Self-pay | Admitting: Cardiology

## 2018-07-30 DIAGNOSIS — R270 Ataxia, unspecified: Secondary | ICD-10-CM

## 2018-07-30 DIAGNOSIS — G603 Idiopathic progressive neuropathy: Secondary | ICD-10-CM

## 2018-07-30 DIAGNOSIS — Z0289 Encounter for other administrative examinations: Secondary | ICD-10-CM

## 2018-07-30 DIAGNOSIS — R2689 Other abnormalities of gait and mobility: Secondary | ICD-10-CM | POA: Diagnosis not present

## 2018-07-30 DIAGNOSIS — R29898 Other symptoms and signs involving the musculoskeletal system: Secondary | ICD-10-CM

## 2018-07-30 DIAGNOSIS — M6281 Muscle weakness (generalized): Secondary | ICD-10-CM | POA: Diagnosis not present

## 2018-07-30 DIAGNOSIS — I6389 Other cerebral infarction: Secondary | ICD-10-CM

## 2018-07-30 NOTE — Progress Notes (Signed)
Kayla Harrison is a lovely 72 y.o. female here as requested by Dr. Forde Dandy for dizziness and gait abnormality.  Past medical history osteoarthritis, atrial fibrillation status post ablation complicated by bradycardia requiring pacemaker implant, breast cancer, total thyroidectomy 1999 for cancer, complete heart block, dyslipidemia, dyspnea, history of pleural effusion, hyperlipidemia, morbid obesity status post lap band surgery, non-Hodgkin's lymphoma, stroke, osa. Long term use of anticoagulants Eliquis.  Reviewed work-up to date with patient. EMF/NCS, MRI of the brain and cervical spine and MRA of the head and neck did not show any new etiologies to explain her dizziness and gait abnormality however her ataxia and parkinsonism is likely due to vascular parkinsonism due to extensive white matter changes which have progressed on latest MRI of the brain. Patient also has known chronic polyneuropathy which is contributing to her gait.  Today EMG nerve conduction study did not show any neuromuscular disorder but did confirm widespread polyneuropathy however given the girth of her legs, nerve conduction studies may not be accurate due to technical considerations.  I do recommend MRI of the lumbar spine to completely rule out every other etiology such as lumbar stenosis however her gait ataxia likely due to vascular parkinsonism and is chronic and nonreversible.  Labs were normal including CK, B12 and methylmalonic acid.    Mri cervical  1. Advanced chronic disc and endplate degeneration I3-J8 and C6-C7 with mild spinal stenosis and spinal cord mass effect. No cord signal abnormality. Lesser degenerative spinal stenosis and cord mass effect at C3-C4. 2. Suspected small right subarticular disc herniation at C7-T1. Query right C8 radiculitis. 3. Degenerative marrow signal changes in the spine with no osseous lesions suspicious for lymphoma.  Mri brain, mra head and neck   IMPRESSION: 1. No acute  intracranial abnormality. No explanation for acute headache or vertigo. 2. Head and Neck MRA appear stable since 2004 and normal for age. 3. Chronic small vessel disease has progressed since 2011, notably interval micro-hemorrhage in the left thalamus. Underlying advanced chronic cerebral white matter signal changes  Orders Placed This Encounter  Procedures  . MR LUMBAR SPINE WO CONTRAST  . Ambulatory referral to Physical Therapy     A total of 40 minutes was spent face-to-face with this patient. Over half this time was spent on counseling patient on the  1. Weakness of both lower extremities   2. Idiopathic progressive neuropathy   3. Proximal muscle weakness   4. Ataxia   5. Imbalance     diagnosis and different diagnostic and therapeutic options, counseling and coordination of care, risks ans benefits of management, compliance, or risk factor reduction and education.This does not include time spent on emg/ncs.

## 2018-07-30 NOTE — Telephone Encounter (Signed)
Pt here for EMG now. Dr. Jaynee Eagles stated she would go over results with pt.

## 2018-07-31 DIAGNOSIS — Z9889 Other specified postprocedural states: Secondary | ICD-10-CM | POA: Diagnosis not present

## 2018-08-03 NOTE — Procedures (Signed)
Full Name: Kayla Harrison Gender: Female MRN #: [id number] Date of Birth: 01/19/2046    Visit Date: 07/30/18 09:44 Age: 72 Years 2 Months Old Examining Physician: Sarina Ill, MD     History: Gait abnormality, lower extremity weakness  Summary:   Evaluation of the left Ulnar ADM motor nerve showed delayed latency(3.26ms , N<3.3).  The right peroneal motor nerve showed delayed latency (7.9 ms, N< 6.5) and reduced amplitude (0.2 mV, N> 2). The left peroneal motor nerve showed delayed latency (6.9 ms, N< 6.5) and reduced amplitude (0.5 mV, N> 2). The left tibial motor nerve showed delayed latency (6.4 ms, N< 5.8) and reduced amplitude (0.3 mV, N> 4). The right tibial motor nerve showed delayed latency (6.3 ms, N< 5.8) and reduced amplitude (0.8 mV, N> 4). The left radial sensory nerve showed delayed latency(3.71ms,N<2.9) and reduced amplitude (9uV, N>15). Bilateral sural sensory nerves showed no response. Bilateral superficial peroneal sensory nerves showed no response. The left  Ulnar 5th digit sensory nerve showed delayed latency(3.1ms, N<3.1) and reduced amplitude (3V, N>5).  The right Tibial F wave showed delayed latency(63.24ms,N<56). The left Tibial F wave showed delayed latency(64.ms,N<56). The left ulnar F wave showed delayed latency(34.31ms,N<32).  All remaining nerves (as indicated in the following tables) were within normal limits.    All muscles were within normal limits  Conclusion: 1. There is sensorimotor, axonal, length-dependent polyneuropathy. 2. No evidence on EMG needle study to suggest radiculopathy, myopathy/myositis or other neuromuscular disorders.   Sarina Ill M.D.  [facility] [place], [place] [address] Tel: [phone] Fax: [phone]        [facility]    Nerve / Sites Muscle Latency Ref. Amplitude Ref. Rel Amp Segments Distance Velocity Ref. Area    ms ms mV mV %  cm m/s m/s mVms  L Ulnar - ADM     Wrist ADM 3.5 ?3.3  6.5 ?6.0 100 Wrist - ADM 7   26.3     B.Elbow ADM 7.2  6.0  91.8 B.Elbow - Wrist 16 44 ?49 25.3     A.Elbow ADM 9.5  5.8  98.1 A.Elbow - B.Elbow 10 43 ?49 24.0         A.Elbow - Wrist      R Peroneal - EDB     Ankle EDB 7.9 ?6.5 0.2 ?2.0 100 Ankle - EDB 9   0.9     Fib head EDB 16.1  0.2  94 Fib head - Ankle 30 36 ?44 0.9     Pop fossa EDB 18.9  0.4  203 Pop fossa - Fib head 10 37 ?44 1.9         Pop fossa - Ankle      L Peroneal - EDB     Ankle EDB 6.9 ?6.5 0.5 ?2.0 100 Ankle - EDB 9   0.8     Fib head EDB 14.8  0.3  59.1 Fib head - Ankle 30 38 ?44 0.5     Pop fossa EDB 17.7  0.1  53.1 Pop fossa - Fib head 10 36 ?44 1.2         Pop fossa - Ankle      R Tibial - AH     Ankle AH 6.3 ?5.8 0.8 ?4.0 100 Ankle - AH 9   1.5     Pop fossa AH 17.7  1.1  140 Pop fossa - Ankle 41 36 ?41 3.3  L Tibial - AH  Ankle AH 6.4 ?5.8 0.3 ?4.0 100 Ankle - AH 9   1.9     Pop fossa AH 17.9  0.2  49.3 Pop fossa - Ankle 41 36 ?41 2.9               SNC    Nerve / Sites Rec. Site Peak Lat Ref.  Amp Ref. Segments Distance    ms ms V V  cm  L Radial - Anatomical snuff box (Forearm)     Forearm Wrist 3.2 ?2.9 9 ?15 Forearm - Wrist 10  R Sural - Ankle (Calf)     Calf Ankle NR ?4.4 NR ?6 Calf - Ankle 14  L Sural - Ankle (Calf)     Calf Ankle NR ?4.4 NR ?6 Calf - Ankle 14  R Superficial peroneal - Ankle     Lat leg Ankle NR ?4.4 NR ?6 Lat leg - Ankle 14  L Superficial peroneal - Ankle     Lat leg Ankle NR ?4.4 NR ?6 Lat leg - Ankle 14  L Ulnar - Orthodromic, (Dig V, Mid palm)     Dig V Wrist 3.4 ?3.1 3 ?5 Dig V - Wrist 19                 F  Wave    Nerve F Lat Ref.   ms ms  R Tibial - AH 63.7 ?56.0  L Tibial - AH 64.9 ?56.0  L Ulnar - ADM 34.3 ?32.0           EMG full       EMG Summary Table    Spontaneous MUAP Recruitment  Muscle IA Fib PSW Fasc Other Amp Dur. Poly Pattern  L. Iliopsoas Normal None None None _______ Normal Normal Normal Normal  L. Vastus medialis Normal None None None  _______ Normal Normal Normal Normal  L. Tibialis anterior Normal None None None _______ Normal Normal Normal Normal  L. Gastrocnemius (Medial head) Normal None None None _______ Normal Normal Normal Normal  L. Biceps femoris (long head) Normal None None None _______ Normal Normal Normal Normal  L. Gluteus medius Normal None None None _______ Normal Normal Normal Normal  L. Gluteus maximus Normal None None None _______ Normal Normal Normal Normal  L. Lumbar paraspinals (low) Normal None None None _______ Normal Normal Normal Normal

## 2018-08-03 NOTE — Progress Notes (Signed)
See procedure note.

## 2018-08-03 NOTE — Progress Notes (Signed)
Full Name: Kayla Harrison Gender: Female MRN #: [id number] Date of Birth: Feb 27, 2046    Visit Date: 07/30/18 09:44 Age: 72 Years 2 Months Old Examining Physician: Sarina Ill, MD  Referring Physician: [name], MD    History: Gait abnormality, lower extremity weakness  Summary:   Evaluation of the left Ulnar ADM motor nerve showed delayed latency(3.30ms , N<3.3).  The right peroneal motor nerve showed delayed latency (7.9 ms, N< 6.5) and reduced amplitude (0.2 mV, N> 2). The left peroneal motor nerve showed delayed latency (6.9 ms, N< 6.5) and reduced amplitude (0.5 mV, N> 2). The left tibial motor nerve showed delayed latency (6.4 ms, N< 5.8) and reduced amplitude (0.3 mV, N> 4). The right tibial motor nerve showed delayed latency (6.3 ms, N< 5.8) and reduced amplitude (0.8 mV, N> 4). The left radial sensory nerve showed delayed latency(3.66ms,N<2.9) and reduced amplitude (9uV, N>15). Bilateral sural sensory nerves showed no response. Bilateral superficial peroneal sensory nerves showed no response. The left  Ulnar 5th digit sensory nerve showed delayed latency(3.57ms, N<3.1) and reduced amplitude (3V, N>5).  The right Tibial F wave showed delayed latency(63.64ms,N<56). The left Tibial F wave showed delayed latency(64.ms,N<56). The left ulnar F wave showed delayed latency(34.81ms,N<32).  All remaining nerves (as indicated in the following tables) were within normal limits.    All muscles were within normal limits  Conclusion: 1. There is sensorimotor, axonal, length-dependent polyneuropathy. 2. No evidence on EMG needle study to suggest radiculopathy, myopathy/myositis or other neuromuscular disorders.   Cc: [name], MD  Sarina Ill M.D.  [facility] [place], [place] [address] Tel: [phone] Fax: [phone]        [facility]    Nerve / Sites Muscle Latency Ref. Amplitude Ref. Rel Amp Segments Distance Velocity Ref. Area    ms  ms mV mV %  cm m/s m/s mVms  L Ulnar - ADM     Wrist ADM 3.5 ?3.3 6.5 ?6.0 100 Wrist - ADM 7   26.3     B.Elbow ADM 7.2  6.0  91.8 B.Elbow - Wrist 16 44 ?49 25.3     A.Elbow ADM 9.5  5.8  98.1 A.Elbow - B.Elbow 10 43 ?49 24.0         A.Elbow - Wrist      R Peroneal - EDB     Ankle EDB 7.9 ?6.5 0.2 ?2.0 100 Ankle - EDB 9   0.9     Fib head EDB 16.1  0.2  94 Fib head - Ankle 30 36 ?44 0.9     Pop fossa EDB 18.9  0.4  203 Pop fossa - Fib head 10 37 ?44 1.9         Pop fossa - Ankle      L Peroneal - EDB     Ankle EDB 6.9 ?6.5 0.5 ?2.0 100 Ankle - EDB 9   0.8     Fib head EDB 14.8  0.3  59.1 Fib head - Ankle 30 38 ?44 0.5     Pop fossa EDB 17.7  0.1  53.1 Pop fossa - Fib head 10 36 ?44 1.2         Pop fossa - Ankle      R Tibial - AH     Ankle AH 6.3 ?5.8 0.8 ?4.0 100 Ankle - AH 9   1.5     Pop fossa AH 17.7  1.1  140 Pop fossa - Ankle  41 36 ?41 3.3  L Tibial - AH     Ankle AH 6.4 ?5.8 0.3 ?4.0 100 Ankle - AH 9   1.9     Pop fossa AH 17.9  0.2  49.3 Pop fossa - Ankle 41 36 ?41 2.9               SNC    Nerve / Sites Rec. Site Peak Lat Ref.  Amp Ref. Segments Distance    ms ms V V  cm  L Radial - Anatomical snuff box (Forearm)     Forearm Wrist 3.2 ?2.9 9 ?15 Forearm - Wrist 10  R Sural - Ankle (Calf)     Calf Ankle NR ?4.4 NR ?6 Calf - Ankle 14  L Sural - Ankle (Calf)     Calf Ankle NR ?4.4 NR ?6 Calf - Ankle 14  R Superficial peroneal - Ankle     Lat leg Ankle NR ?4.4 NR ?6 Lat leg - Ankle 14  L Superficial peroneal - Ankle     Lat leg Ankle NR ?4.4 NR ?6 Lat leg - Ankle 14  L Ulnar - Orthodromic, (Dig V, Mid palm)     Dig V Wrist 3.4 ?3.1 3 ?5 Dig V - Wrist 58                 F  Wave    Nerve F Lat Ref.   ms ms  R Tibial - AH 63.7 ?56.0  L Tibial - AH 64.9 ?56.0  L Ulnar - ADM 34.3 ?32.0           EMG full       EMG Summary Table    Spontaneous MUAP Recruitment  Muscle IA Fib PSW Fasc Other Amp Dur. Poly Pattern  L. Iliopsoas Normal None None None _______ Normal  Normal Normal Normal  L. Vastus medialis Normal None None None _______ Normal Normal Normal Normal  L. Tibialis anterior Normal None None None _______ Normal Normal Normal Normal  L. Gastrocnemius (Medial head) Normal None None None _______ Normal Normal Normal Normal  L. Biceps femoris (long head) Normal None None None _______ Normal Normal Normal Normal  L. Gluteus medius Normal None None None _______ Normal Normal Normal Normal  L. Gluteus maximus Normal None None None _______ Normal Normal Normal Normal  L. Lumbar paraspinals (low) Normal None None None _______ Normal Normal Normal Normal

## 2018-08-04 ENCOUNTER — Telehealth: Payer: Self-pay | Admitting: Neurology

## 2018-08-04 NOTE — Telephone Encounter (Signed)
Medicare/mutual of [place] no auth faxed pacemaker form to [place] at [facility] if everything is okay I asked for them to contact the patient to schedule.

## 2018-08-06 NOTE — Telephone Encounter (Signed)
Patient is scheduled at [facility] for 08/21/18.

## 2018-08-10 ENCOUNTER — Ambulatory Visit (HOSPITAL_COMMUNITY)
Admission: RE | Admit: 2018-08-10 | Discharge: 2018-08-10 | Disposition: A | Discharge status: Home / Self Care | Payer: Medicare Other | Source: Ambulatory Visit | Attending: Hematology and Oncology | Admitting: Hematology and Oncology

## 2018-08-10 ENCOUNTER — Encounter (HOSPITAL_COMMUNITY): Payer: Self-pay

## 2018-08-10 ENCOUNTER — Inpatient Hospital Stay: Payer: Medicare Other | Attending: Hematology and Oncology

## 2018-08-10 DIAGNOSIS — Z9221 Personal history of antineoplastic chemotherapy: Secondary | ICD-10-CM | POA: Diagnosis not present

## 2018-08-10 DIAGNOSIS — Z5111 Encounter for antineoplastic chemotherapy: Secondary | ICD-10-CM | POA: Diagnosis not present

## 2018-08-10 DIAGNOSIS — R590 Localized enlarged lymph nodes: Secondary | ICD-10-CM | POA: Diagnosis not present

## 2018-08-10 DIAGNOSIS — I700 Atherosclerosis of aorta: Secondary | ICD-10-CM | POA: Insufficient documentation

## 2018-08-10 DIAGNOSIS — R1084 Generalized abdominal pain: Secondary | ICD-10-CM

## 2018-08-10 DIAGNOSIS — J90 Pleural effusion, not elsewhere classified: Secondary | ICD-10-CM | POA: Insufficient documentation

## 2018-08-10 DIAGNOSIS — C8592 Non-Hodgkin lymphoma, unspecified, intrathoracic lymph nodes: Secondary | ICD-10-CM | POA: Diagnosis not present

## 2018-08-10 DIAGNOSIS — Z79899 Other long term (current) drug therapy: Secondary | ICD-10-CM | POA: Diagnosis not present

## 2018-08-10 DIAGNOSIS — C8522 Mediastinal (thymic) large B-cell lymphoma, intrathoracic lymph nodes: Secondary | ICD-10-CM | POA: Diagnosis not present

## 2018-08-10 DIAGNOSIS — Z7901 Long term (current) use of anticoagulants: Secondary | ICD-10-CM | POA: Insufficient documentation

## 2018-08-10 DIAGNOSIS — I4892 Unspecified atrial flutter: Secondary | ICD-10-CM | POA: Diagnosis not present

## 2018-08-10 DIAGNOSIS — K449 Diaphragmatic hernia without obstruction or gangrene: Secondary | ICD-10-CM | POA: Diagnosis not present

## 2018-08-10 LAB — CMP (CANCER CENTER ONLY)
ALT: 17 U/L (ref 0–44)
AST: 18 U/L (ref 15–41)
Albumin: 3.7 g/dL (ref 3.5–5.0)
Alkaline Phosphatase: 89 U/L (ref 38–126)
Anion gap: 10 (ref 5–15)
BILIRUBIN TOTAL: 0.7 mg/dL (ref 0.3–1.2)
BUN: 17 mg/dL (ref 8–23)
CO2: 26 mmol/L (ref 22–32)
CREATININE: 0.94 mg/dL (ref 0.44–1.00)
Calcium: 9.5 mg/dL (ref 8.9–10.3)
Chloride: 106 mmol/L (ref 98–111)
GFR, EST NON AFRICAN AMERICAN: 58 mL/min — AB (ref >60)
GFR, Est AFR Am: >60 mL/min (ref >60)
Glucose, Bld: 93 mg/dL (ref 70–99)
Potassium: 4.3 mmol/L (ref 3.5–5.1)
Sodium: 142 mmol/L (ref 135–145)
Total Protein: 6.9 g/dL (ref 6.5–8.1)

## 2018-08-10 LAB — CBC WITH DIFFERENTIAL (CANCER CENTER ONLY)
Abs Immature Granulocytes: 0 10*3/uL (ref 0.00–0.07)
BASOS PCT: 1 %
Basophils Absolute: 0 10*3/uL (ref 0.0–0.1)
Eosinophils Absolute: 0.1 10*3/uL (ref 0.0–0.5)
Eosinophils Relative: 2 %
HCT: 38.8 % (ref 36.0–46.0)
Hemoglobin: 12.2 g/dL (ref 12.0–15.0)
Immature Granulocytes: 0 %
LYMPHS ABS: 0.7 10*3/uL (ref 0.7–4.0)
Lymphocytes Relative: 14 %
MCH: 29.5 pg (ref 26.0–34.0)
MCHC: 31.4 g/dL (ref 30.0–36.0)
MCV: 93.9 fL (ref 80.0–100.0)
MONOS PCT: 13 %
Monocytes Absolute: 0.7 10*3/uL (ref 0.1–1.0)
NEUTROS ABS: 3.7 10*3/uL (ref 1.7–7.7)
Neutrophils Relative %: 70 %
Platelet Count: 172 10*3/uL (ref 150–400)
RBC: 4.13 MIL/uL (ref 3.87–5.11)
RDW: 14.7 % (ref 11.5–15.5)
WBC Count: 5.2 10*3/uL (ref 4.0–10.5)
nRBC: 0 % (ref 0.0–0.2)

## 2018-08-10 LAB — LACTATE DEHYDROGENASE: LDH: 224 U/L — AB (ref 98–192)

## 2018-08-10 MED ORDER — IOHEXOL 300 MG/ML  SOLN
100.0000 mL | Freq: Once | INTRAMUSCULAR | Status: AC | PRN
  Administered 2018-08-10: 100 mL via INTRAVENOUS

## 2018-08-10 MED ORDER — SODIUM CHLORIDE (PF) 0.9 % IJ SOLN
INTRAMUSCULAR | Status: AC
  Filled 2018-08-10: qty 50

## 2018-08-12 ENCOUNTER — Inpatient Hospital Stay (HOSPITAL_BASED_OUTPATIENT_CLINIC_OR_DEPARTMENT_OTHER): Payer: Medicare Other | Admitting: Hematology and Oncology

## 2018-08-12 DIAGNOSIS — C8522 Mediastinal (thymic) large B-cell lymphoma, intrathoracic lymph nodes: Secondary | ICD-10-CM

## 2018-08-12 DIAGNOSIS — Z9221 Personal history of antineoplastic chemotherapy: Secondary | ICD-10-CM

## 2018-08-12 DIAGNOSIS — I4892 Unspecified atrial flutter: Secondary | ICD-10-CM

## 2018-08-12 DIAGNOSIS — Z7901 Long term (current) use of anticoagulants: Secondary | ICD-10-CM

## 2018-08-12 DIAGNOSIS — Z79899 Other long term (current) drug therapy: Secondary | ICD-10-CM

## 2018-08-12 NOTE — Assessment & Plan Note (Signed)
02/03/2017: Large superior mediastinal mass contiguous with right hilar region encasing the trachea and right mainstem bronchus measuring 5.4 x 7.5 x 6 cm causing SVC obstruction, additional mediastinal lymph nodes subcarinal 1.6 cm and left hilar 1 cm; multiple small pulmonary nodules 4-6 mm   Bronchoscopy and biopsy 02/09/2017: Monoclonal B-cell population expressing CD10 consistent with non-Hodgkin's B-cell lymphoma germinal center origin Bone marrow biopsy 02/21/2017: No evidence of lymphoma PET/CT scan 29/93/7169: Hypermetabolic right paratracheal nodal mass  R CHOP x6 cycles completed 07/16/2017 ---------------------------------------------------------------------- Atrial flutter: Cardioversion by Dr. Acie Fredrickson 07/18/2017 PET/CT scan 08/27/2017:Hypermetabolic right paratracheal nodal mass February 2019: Renal stone status post nephrostomy  CT chest abdomen pelvis 02/17/2018: New patchy consolidation right lower lobe infection/inflammation, postradiation changes in the mediastinum.  No evidence of lymphoma recurrence  Lung infiltrates:  CT CAP 08/10/2018: Indeterminate clustered nodularity posterior right lower lobe mildly increased suggest inflammatory process.  Stable mild mediastinal adenopathy  Radiology review: I discussed with the patient that there is no clear-cut evidence of lymphoma.  We will need to monitor these lung infiltrates and nodules periodically. I recommended another 75-monthfollow-up with CT scans.

## 2018-08-12 NOTE — Progress Notes (Signed)
Patient Care Team: [name], MD as PCP - General (Endocrinology) Deboraha Sprang, MD as PCP - Cardiology (Cardiology)  DIAGNOSIS:  Encounter Diagnosis  Name Primary?  . 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)     SUMMARY OF ONCOLOGIC HISTORY:   Non-Hodgkin lymphoma, unspecified, intrathoracic lymph nodes ([place])   02/03/2017 Imaging    Large superior mediastinal mass contiguous with right hilar region encasing the trachea and right mainstem bronchus measuring 5.4 x 7.5 x 6 cm causing SVC obstruction, additional mediastinal lymph nodes subcarinal 1.6 cm and left hilar 1 cm; multiple small pulmonary nodules 4-6 mm     02/11/2017 Initial Diagnosis    Flow cytometry of mediastinal mass revealed monoclonal population of B cells with expression of CD10 consistent with non-Hodgkin B-cell lymphoma germinal center operation; bone marrow biopsy negative for lymphoma    04/02/2017 - 07/16/2017 Chemotherapy    R-CHOP X 6 cycles      CHIEF COMPLIANT: Follow-up of lymphoma to review blood work and CT scans, recent brain scans as well  INTERVAL HISTORY: Kayla Harrison is a 72 year old with above-mentioned history of large superior mediastinal mass which was lymphoma treated with R-CHOP for 6 cycles who is currently here to review the results of her blood work CT scans and recently performed brain imaging.  She does not have any symptoms other than cough and expectoration intermittently.  Denies any fevers chills night sweats or weight loss.  REVIEW OF SYSTEMS:   Constitutional: Denies fevers, chills or abnormal weight loss Eyes: Denies blurriness of vision Ears, nose, mouth, throat, and face: Denies mucositis or sore throat Respiratory: Denies cough, dyspnea or wheezes Cardiovascular: Denies palpitation, chest discomfort Gastrointestinal:  Denies nausea, heartburn or change in bowel habits Skin: Denies abnormal skin rashes Lymphatics: Denies new  lymphadenopathy or easy bruising Neurological:Denies numbness, tingling or new weaknesses Behavioral/Psych: Mood is stable, no new changes  Extremities: No lower extremity edema   All other systems were reviewed with the patient and are negative.  I have reviewed the past medical history, past surgical history, social history and family history with the patient and they are unchanged from previous note.  ALLERGIES:  is allergic to dofetilide; rivaroxaban; epinephrine; adhesive [tape]; and ketamine.  MEDICATIONS:  Current Outpatient Medications  Medication Sig Dispense Refill  . acetaminophen (TYLENOL) 500 MG tablet Take 1,000 mg by mouth at bedtime.     Marland Kitchen amiodarone (PACERONE) 200 MG tablet Take 1 tablet (200 mg total) by mouth daily. 90 tablet 3  . ELIQUIS 5 MG TABS tablet TAKE 1 TABLET BY MOUTH TWO  TIMES DAILY 180 tablet 2  . furosemide (LASIX) 20 MG tablet Take 1 tablets daily till you see your cardiolgist (Patient taking differently: Take 20 mg by mouth daily as needed for fluid or edema. ) 30 tablet 0  . levothyroxine (SYNTHROID, LEVOTHROID) 175 MCG tablet Take 175 mcg by mouth See admin instructions. TAKE 1 TABLET (175 MCG) BY MOUTH DAILY, EXCEPT FRIDAYS.    Marland Kitchen Lifitegrast (XIIDRA) 5 % SOLN Apply 1 drop to eye at bedtime.     Marland Kitchen LORazepam (ATIVAN) 1 MG tablet Take 1 tablet (1 mg total) by mouth 3 (three) times daily as needed for anxiety. 15 tablet 0  . meclizine (ANTIVERT) 25 MG tablet Take 1 tablet (25 mg total) by mouth 3 (three) times daily as needed for dizziness. 30 tablet 0  . Multiple Vitamin (MULTIVITAMIN) tablet Take 1 tablet by mouth daily.     Marland Kitchen  rosuvastatin (CRESTOR) 10 MG tablet Take 10 mg by mouth every Wednesday. In the morning    . traMADol (ULTRAM) 50 MG tablet Take 50 mg by mouth 2 (two) times daily as needed (for moderate shoulder pain.).     No current facility-administered medications for this visit.     PHYSICAL EXAMINATION: ECOG PERFORMANCE STATUS: 1 -  Symptomatic but completely ambulatory  Vitals:   08/12/18 1556  BP: 136/68  Pulse: 83  Resp: 17  Temp: 97.8 F (36.6 C)  SpO2: 100%   Filed Weights   08/12/18 1556  Weight: 206 lb 11.2 oz (93.8 kg)    GENERAL:alert, no distress and comfortable SKIN: skin color, texture, turgor are normal, no rashes or significant lesions EYES: normal, Conjunctiva are pink and non-injected, sclera clear OROPHARYNX:no exudate, no erythema and lips, buccal mucosa, and tongue normal  NECK: supple, thyroid normal size, non-tender, without nodularity LYMPH:  no palpable lymphadenopathy in the cervical, axillary or inguinal LUNGS: clear to auscultation and percussion with normal breathing effort HEART: regular rate & rhythm and no murmurs and no lower extremity edema ABDOMEN:abdomen soft, non-tender and normal bowel sounds MUSCULOSKELETAL:no cyanosis of digits and no clubbing  NEURO: alert & oriented x 3 with fluent speech, no focal motor/sensory deficits EXTREMITIES: No lower extremity edema   LABORATORY DATA:  I have reviewed the data as listed CMP Latest Ref Rng & Units 08/10/2018 06/27/2018 05/05/2018  Glucose 70 - 99 mg/dL 93 99 85  BUN 8 - 23 mg/dL _0 Creatinine 0.44 - 1.00 mg/dL 0.94 0.76 1.02(H)  Sodium 135 - 145 mmol/L 142 139 140  Potassium 3.5 - 5.1 mmol/L 4.3 3.8 4.3  Chloride 98 - 111 mmol/L 106 104 107  CO2 22 - 32 mmol/L _1 Calcium 8.9 - 10.3 mg/dL 9.5 9.4 9.2  Total Protein 6.5 - 8.1 g/dL 6.9 - 6.1(L)  Total Bilirubin 0.3 - 1.2 mg/dL 0.7 - 0.7  Alkaline Phos 38 - 126 U/L 89 - 76  AST 15 - 41 U/L 18 - 17  ALT 0 - 44 U/L 17 - 15    Lab Results  Component Value Date   WBC 5.2 08/10/2018   HGB 12.2 08/10/2018   HCT 38.8 08/10/2018   MCV 93.9 08/10/2018   PLT 172 08/10/2018   NEUTROABS 3.7 08/10/2018    ASSESSMENT & PLAN:  Non-Hodgkin lymphoma, unspecified, intrathoracic lymph nodes ([place]) 02/03/2017: Large superior mediastinal mass contiguous with right hilar  region encasing the trachea and right mainstem bronchus measuring 5.4 x 7.5 x 6 cm causing SVC obstruction, additional mediastinal lymph nodes subcarinal 1.6 cm and left hilar 1 cm; multiple small pulmonary nodules 4-6 mm   Bronchoscopy and biopsy 02/09/2017: Monoclonal B-cell population expressing CD10 consistent with non-Hodgkin's B-cell lymphoma germinal center origin Bone marrow biopsy 02/21/2017: No evidence of lymphoma PET/CT scan 79/89/2119: Hypermetabolic right paratracheal nodal mass  R CHOP x6 cycles completed 07/16/2017 ---------------------------------------------------------------------- Atrial flutter: Cardioversion by Dr. Acie Fredrickson 07/18/2017 PET/CT scan 08/27/2017:Hypermetabolic right paratracheal nodal mass February 2019: Renal stone status post nephrostomy  CT chest abdomen pelvis 02/17/2018: New patchy consolidation right lower lobe infection/inflammation, postradiation changes in the mediastinum.  No evidence of lymphoma recurrence  Lung infiltrates:  CT CAP 08/10/2018: Indeterminate clustered nodularity posterior right lower lobe mildly increased suggest inflammatory process.  Stable mild mediastinal adenopathy Instructed her to follow-up with pulmonary  Radiology review: I discussed with the patient that there is no clear-cut evidence of lymphoma.  We will need  to monitor these lung infiltrates and nodules periodically. I recommended another 1 yr follow-up with CT scans.    No orders of the defined types were placed in this encounter.  The patient has a good understanding of the overall plan. she agrees with it. she will call with any problems that may develop before the next visit here.   Harriette Ohara, MD 08/12/18

## 2018-08-13 ENCOUNTER — Telehealth: Payer: Self-pay | Admitting: Hematology and Oncology

## 2018-08-13 NOTE — Telephone Encounter (Signed)
No 11/21 los.  Radiology will call patient with CT appt.

## 2018-08-14 ENCOUNTER — Ambulatory Visit: Payer: Medicare Other | Attending: Neurology | Admitting: Rehabilitation

## 2018-08-14 ENCOUNTER — Encounter: Payer: Self-pay | Admitting: Rehabilitation

## 2018-08-14 DIAGNOSIS — R2681 Unsteadiness on feet: Secondary | ICD-10-CM | POA: Diagnosis not present

## 2018-08-14 DIAGNOSIS — R2689 Other abnormalities of gait and mobility: Secondary | ICD-10-CM | POA: Insufficient documentation

## 2018-08-14 DIAGNOSIS — R293 Abnormal posture: Secondary | ICD-10-CM | POA: Insufficient documentation

## 2018-08-14 DIAGNOSIS — M6281 Muscle weakness (generalized): Secondary | ICD-10-CM

## 2018-08-14 DIAGNOSIS — R296 Repeated falls: Secondary | ICD-10-CM | POA: Insufficient documentation

## 2018-08-14 NOTE — Therapy (Signed)
[facility] [address] [place], [place], [address] Phone: [phone]   Fax:  [phone]  Physical Therapy Evaluation  Patient Details  Name: Kayla Harrison MRN: [id number] Date of Birth: 27-Aug-1946 Referring Provider (PT): Sarina Ill, MD   Encounter Date: 08/14/2018  PT End of Session - 08/14/18 1453    Visit Number  1    Number of Visits  17    Date for PT Re-Evaluation  11/12/18   only plan to see for 60 days, note one week delay in start 2/2 holiday)   Authorization Type  Medicare and [employer]-needs 10th visit progress note    PT Start Time  1147    PT Stop Time  1232    PT Time Calculation (min)  45 min    Activity Tolerance  Patient tolerated treatment well    Behavior During Therapy  [facility] for tasks assessed/performed       Past Medical History:  Diagnosis Date  . Anemia   . Arthritis    osteoarthritis - knees and right shoulder  . Atrial fibrillation [facility])    ablation- 2x's-- 1st time- [facility], 2nd event at [facility] in 2008. Convergent ablation at [facility] 6/14  . Atrial fibrillation ([place])   . Blood transfusion without reported diagnosis   . Breast cancer ([place])    Dr Margot Chimes, total thyroidectomy- 1999- for cancer  . 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 Chronic bilateral pleural effusions   . Colon polyp    Dr Earlean Shawl  . Complete heart block ([place])   . Complication of anesthesia    Ketamine produces LSD reaction, bright colored nightmarish experience   . Dyslipidemia   . Dyspnea   . Endometriosis   . Fibroids   . H/O pleural effusion    s/p thoracentesis w 3239m withdrawn  . Hepatitis    [place] ?hepatitis   . History of dysphagia    due to radiation therapy  . History of hiatal hernia    small noted on PET scan  . History of kidney stones   . Hx of thyroid cancer    Dr SForde Dandy . Hyperlipidemia   . Hypertension   . Hypothyroidism   . [facility] [place] lobe  [facility]   radiation RX completed 03/04/17; will start chemo 6/27, pt unaware of lung cancer  . Morbid obesity ([facility]    Status post lap band surgery  . Nephrolithiasis   . Non Hodgkin's lymphoma ([facility]    on chemotherapy  . Persistent atrial fibrillation    a. s/p PVI 2008 b. s/p convergent ablation 22778complicated by bradycardia requiring pacemaker implant  . Presence of permanent cardiac pacemaker   . Rotator cuff tear    Right  . Sinus node dysfunction ([facility]    Complicating convergent ablation 6/14  . St[place] ([facility]    [place]  . SVC syndrome    with lung mass and non hodgkins lymphoma  . Thyroid cancer ([facility] 2000    Past Surgical History:  Procedure Laterality Date  . ABDOMINAL HYSTERECTOMY  1983  . afib ablation     a. 2008 PVI b. 2014 convergent ablation  . APPENDECTOMY    . BONE MARROW BIOPSY  02/21/2017  . BREAST LUMPECTOMY Left 2010  . bso  1998  . CARDIAC CATHETERIZATION     2015- negative  . CARDIOVERSION  10/09/2012   Procedure: CARDIOVERSION;  Surgeon: JMinus Breeding MD;  Location: [facility];  Service: Cardiovascular;  Laterality: N/A;  . CARDIOVERSION  10/09/2012   Procedure: CARDIOVERSION;  Surgeon: Minus Breeding, MD;  Location: [facility] ENDOSCOPY;  Service: Cardiovascular;  Laterality: N/A;  [name] gave the ok to add pt to the add on , but we must check to find out if the can add pt on at [phone] ( [phone])  . CARDIOVERSION N/A 11/20/2012   Procedure: CARDIOVERSION;  Surgeon: Fay Records, MD;  Location: [facility] ENDOSCOPY;  Service: Cardiovascular;  Laterality: N/A;  . CARDIOVERSION N/A 07/18/2017   Procedure: CARDIOVERSION;  Surgeon: Thayer Headings, MD;  Location[facility] ENDOSCOPY;  Service: Cardiovascular;  Laterality: N/A;  . CARDIOVERSION N/A 10/03/2017   Procedure: CARDIOVERSION;  Surgeon: Sanda Klein, MD;  Location: [facility] ENDOSCOPY;  Service: Cardiovascular;  Laterality: N/A;  . CARDIOVERSION N/A 01/07/2018   Procedure: CARDIOVERSION;  Surgeon: Acie Fredrickson Wonda Cheng, MD;   Location: [facility];  Service: Cardiovascular;  Laterality: N/A;  . CHOLECYSTECTOMY    . COLONOSCOPY W/ POLYPECTOMY     Dr Earlean Shawl  . CYSTOSCOPY N/A 02/06/2015   Procedure: CYSTOSCOPY;  Surgeon: Kathie Rhodes, MD;  [facility];  Service: Urology;  Laterality: N/A;  . CYSTOSCOPY W/ RETROGRADES Left 11/17/2017   Procedure: CYSTOSCOPY WITH RETROGRADE /PYELOGRAM/;  Surgeon: Kathie Rhodes, MD;  Location: [facility];  Service: Urology;  Laterality: Left;  . CYSTOSCOPY WITH RETROGRADE PYELOGRAM, URETEROSCOPY AND STENT PLACEMENT Right 02/06/2015   Procedure: RETROGRADE PYELOGRAM, RIGHT URETEROSCOPY STENT PLACEMENT;  Surgeon: Kathie Rhodes, MD;  [facility];  Service: Urology;  Laterality: Right;  . CYSTOSCOPY WITH RETROGRADE PYELOGRAM, URETEROSCOPY AND STENT PLACEMENT Right 03/07/2017   Procedure: CYSTOSCOPY WITH RIGHT RETROGRADE PYELOGRAM,RIGHT  URETEROSCOPYLASER LITHOTRIPSY  AND STENT PLACEMENT AND STONE BASKETRY;  Surgeon: Kathie Rhodes, MD;  Location: [facility];  Service: Urology;  Laterality: Right;  . EYE SURGERY     cataract surgery  . fatty mass removal  1999   pubic area  . HOLMIUM LASER APPLICATION N/A 12/30/1446   Procedure: HOLMIUM LASER APPLICATION;  Surgeon: Kathie Rhodes, MD;  [facility];  Service: Urology;  Laterality: N/A;  . HOLMIUM LASER APPLICATION Right 1/85/6314   Procedure: HOLMIUM LASER APPLICATION;  Surgeon: Kathie Rhodes, MD;  Location: [facility];  Service: Urology;  Laterality: Right;  . HOLMIUM LASER APPLICATION Left 9/70/2637   Procedure: HOLMIUM LASER APPLICATION;  Surgeon: Kathie Rhodes, MD;  [facility];  Service: Urology;  Laterality: Left;  . IR FLUORO GUIDE PORT INSERTION RIGHT  02/24/2017  . IR NEPHROSTOMY PLACEMENT RIGHT  11/17/2017  . IR PATIENT EVAL TECH 0-60 MINS  03/11/2017  . IR REMOVAL TUN ACCESS W/ PORT W/O FL MOD SED  04/20/2018  . IR US GUIDE VASC ACCESS RIGHT  02/24/2017  . KNEE ARTHROSCOPY     bilateral  .  LAPAROSCOPIC GASTRIC BANDING  07/10/2010  . LAPAROSCOPIC GASTRIC BANDING     Laparoscopic adjustable banding APS System with posterior hiatal hernia, 2 suture.  Marland Kitchen LAPAROTOMY     for ruptured ovary and ovarian artery   . NEPHROLITHOTOMY Right 11/17/2017   Procedure: NEPHROLITHOTOMY PERCUTANEOUS;  Surgeon: Kathie Rhodes, MD;  [facility];  Service: Urology;  Laterality: Right;  . PACEMAKER INSERTION  03/10/2013   MDT dual chamber PPM  . POCKET REVISION N/A 12/08/2013   Procedure: POCKET REVISION;  Surgeon: Deboraha Sprang, MD;  [facility] C[facility] LAB;  Service: Cardiovascular;  Laterality: N/A;  . PORTA CATH INSERTION    . REVERSE  SHOULDER ARTHROPLASTY Right 05/14/2018   Procedure: RIGHT REVERSE SHOULDER ARTHROPLASTY;  Surgeon: Tania Ade, MD;  Location: [facility];  Service: Orthopedics;  Laterality: Right;  . REVERSE SHOULDER REPLACEMENT Right 05/14/2018  . TEE WITH CARDIOVERSION  09/22/2017  . TEE WITHOUT CARDIOVERSION N/A 10/03/2017   Procedure: TRANSESOPHAGEAL ECHOCARDIOGRAM (TEE);  Surgeon: Sanda Klein, MD;  Location: [facility];  Service: Cardiovascular;  Laterality: N/A;  . THYROIDECTOMY  1998   Dr Margot Chimes  . TONSILLECTOMY    . TOTAL KNEE ARTHROPLASTY  04/13/2012   Procedure: TOTAL KNEE ARTHROPLASTY;  Surgeon: Rudean Haskell, MD;  Location: [place];  Service: Orthopedics;  Laterality: Right;  Marland Kitchen VIDEO BRONCHOSCOPY WITH ENDOBRONCHIAL ULTRASOUND N/A 02/07/2017   Procedure: VIDEO BRONCHOSCOPY WITH ENDOBRONCHIAL ULTRASOUND;  Surgeon: Marshell Garfinkel, MD;  Location: [facility];  Service: Pulmonary;  Laterality: N/A;    There were no vitals filed for this visit.   Subjective Assessment - 08/14/18 1154    Subjective  "I had lymphoma last year and at that time I was not able to even walk because I was so weak.  Then in Feb. I had home health PT and they got me walking.  I also walk in the pool and do the bike 3 days a week at the [facility]."     Pertinent History  lymphoma (had chemotherapy),  vertigo, small vessel disease, arthritis (has had R knee and reverse total shoulder on R)    Limitations  Walking;House hold activities    Patient Stated Goals  "I want to be able to walk in a normal fashion"     Currently in Pain?  No/denies         [facility] PT Assessment - 08/14/18 0001      Assessment   Medical Diagnosis  BLE weakness, imbalance    Referring Provider (PT)  Sarina Ill, MD    Onset Date/Surgical Date  --   about 15 months ago   Prior Therapy  HHPT      Precautions   Precautions  Fall      Balance Screen   Has the patient fallen in the past 6 months  Yes   near falls daily   How many times?  1    Has the patient had a decrease in activity level because of a fear of falling?   No    Is the patient reluctant to leave their home because of a fear of falling?   No      Home Environment   Living Environment  Private residence    Living Arrangements  Alone    Available Help at Discharge  Friend(s);Family;Available PRN/intermittently    Type of Home  Apartment    Home Access  Level entry    Home Layout  One level    [place] - single point;Walker - 4 wheels;Grab bars - tub/shower   walk in shower     Prior Function   Level of Independence  Independent with household mobility without device;Independent with basic ADLs;Independent with community mobility with device    Vocation  Retired    Leisure  Go for walks, go shopping       Cognition   Overall Cognitive Status  Within Functional Limits for tasks assessed      Observation/Other Assessments   Observations  Note that pt has extra adipose tissue in BLEs.  She reports that no MD has been able to explain this other than she has "fatty deposits" in her legs.  Note that on L mid anterior shin area, her skin seems to be almost adhered to muscle (feels like scar tissue).  Pt reports she has fallen multiple times and hit this area so wonders if it is scar tissue as well.  Feel that this greatly impacts her  mobility.       Sensation   Light Touch  Impaired Detail    Light Touch Impaired Details  Impaired RLE;Impaired LLE    Hot/Cold  Impaired Detail    Hot/Cold Impaired Details  Impaired RLE;Impaired LLE    Proprioception  Impaired by gross assessment      Coordination   Gross Motor Movements are Fluid and Coordinated  No    Fine Motor Movements are Fluid and Coordinated  No    Coordination and Movement Description  Due to weakness in BLEs    Heel Shin Test  unable to do due to weakness (L>R)      ROM / Strength   AROM / PROM / Strength  Strength      Strength   Overall Strength  Deficits    Overall Strength Comments  R hip flex 2+/5, L hip flex 2/5, B knee ext 4/5, B knee flex 3+/5, R ankle DF 3+/5, L ankle PF 3+/5      Transfers   Transfers  Sit to Stand;Stand to Sit    Sit to Stand  6: Modified independent (Device/Increase time)    Five time sit to stand comments   10.41 secs without UE support however utilized backs of legs on mat to push from     Stand to Sit  6: Modified independent (Device/Increase time)      Ambulation/Gait   Ambulation/Gait  Yes    Ambulation/Gait Assistance  5: Supervision;4: Min guard    Ambulation/Gait Assistance Details  Pt ambulatory with SPC during most of session.  Note that she tends to keep B knees flexed and reports she feels more stable when knees are bent rather than straight.  Note very little trunk/pelvis dissociation during gait and transitional movements.  Pt also demos poor L foot clearance     Ambulation Distance (Feet)  100 Feet    Assistive device  Straight cane    Gait Pattern  Step-through pattern;Decreased arm swing - right;Decreased arm swing - left;Decreased stride length;Right flexed knee in stance;Left flexed knee in stance;Left foot flat;Lateral hip instability;Trendelenburg;Wide base of support;Poor foot clearance - left;Poor foot clearance - right    Ambulation Surface  Level;Indoor    Gait velocity  2.41 ft/sec with SPC     Stairs  Yes    Stairs Assistance  5: Supervision    Stair Management Technique  One rail Left;Alternating pattern;Forwards;With cane    Number of Stairs  4    Height of Stairs  6      Standardized Balance Assessment   Standardized Balance Assessment  Timed Up and Go Test      Timed Up and Go Test   TUG  Normal TUG    Normal TUG (seconds)  10.84   without AD, but with marked gait deviations               Objective measurements completed on examination: See above findings.              PT Education - 08/14/18 1453    Education Details  evaluation findings, recommendations for continued fitness (increasing workload on bike as able), POC, goals     Person(s) Educated  Patient    Methods  Explanation    Comprehension  Verbalized understanding       PT Short Term Goals - 08/14/18 1504      PT SHORT TERM GOAL #1   Title  Pt will be independent with initial HEP in order to indicate decreased fall risk and improved functional mobility (Target Date: 09/20/18)    Time  5    Period  Weeks    Status  New    Target Date  09/20/18      PT SHORT TERM GOAL #2   Title  Pt will perform DGI and improve score 3 points from baseline in order to indicate decreased fall risk.      Time  5    Period  Weeks    Status  New      PT SHORT TERM GOAL #3   Title  Pt will improve gait speed to >/=2.62 ft/sec w/ LRAD in order to indicate safe community ambulator.     Time  5    Period  Weeks    Status  New      PT SHORT TERM GOAL #4   Title  Will assess need for AFO/support for LLE and request order as appropriate.      Time  5    Period  Weeks    Status  New      PT SHORT TERM GOAL #5   Title  Will assess 6MWT and improve distance by 150' in order to indicate improved functional endurance.     Time  5    Period  Weeks    Status  New      Additional Short Term Goals   Additional Short Term Goals  Yes      PT SHORT TERM GOAL #6   Title  Pt will perform floor recovery  at mod A level in order to indiciate improved ability to get up in case of fall.      Time  5    Period  Weeks    Status  New      PT SHORT TERM GOAL #7   Title  Pt will negotiate up/down curb step with LRAD at mod I level in order to indicate safe community negotiation.     Time  5    Period  Weeks    Status  New        PT Long Term Goals - 08/14/18 1508      PT LONG TERM GOAL #1   Title  Pt will be independent with final HEP in order to indicate decreased fall risk and improved functional mobility.  (Target Date: 10/20/18    Time  9    Period  Weeks    Status  New    Target Date  10/20/18      PT LONG TERM GOAL #2   Title  Pt will perform 5TSS in <11 secs without UE support and without using backs of legs on seated surface in order to indicate improved BLE strength.      Time  9    Period  Weeks    Status  New      PT LONG TERM GOAL #3   Title  Pt will ambulate x 500' w/ LRAD over unlevel outdoor paved surfaces at mod I level in order to indicate safe community mobility.      Time  9    Period  Weeks    Status  New      PT LONG TERM GOAL #4   Title  Pt will improve DGI by 6 points from baseline in order to indicate decreased fall risk.     Time  9    Period  Weeks    Status  New      PT LONG TERM GOAL #5   Title  Pt will perform floor recovery at min A level in order to indicate improved ability to get up from floor in case of fall.     Time  9    Period  Weeks    Status  New      Additional Long Term Goals   Additional Long Term Goals  Yes      PT LONG TERM GOAL #6   Title  Pt will improve 6MWT by 300' from baseline in order to indicate improved functional endurance.     Time  9    Period  Weeks    Status  New             Plan - 08/14/18 1454    Clinical Impression Statement  Pt presents with BLE weakness, decreased balance and idiopathic progressive neuropathy.  Note history of Non-Hodgkin lymphoma last year with chemotherapy which is when pt  reports increase in weakness and decreased balance.  Also note recent history of vertigo.  Dr. Jaynee Eagles has also reported that she has small vessel disease with slight worsening on MRI vs 2011 imaging.  Pt reports that she is walking in pool for 30 mins and riding bike in gym for 30 mins, however does not feel that she is getting better.  Has several near falls, almost daily.  When asked about recent vertigo, she reports it is better but is still present when she looks to the L.  Also note that she reports light headedness when looking upward.  Discussed possible positional vertigo vs vascular changes that could cause light headedness.  Also note that she has "fatty deposits" in BLEs that no MD is able to explain.  This seems to greatly impact her mobility and balance.  Furthermore, note gait speed is 2.41 ft/sec with cane indicative of limited community ambulation, 5TSS time of 10.41 secs with reliance of UEs on back on seated surface for support indicative of BLE weakness, and TUG time of 10.84 secs without AD but with marked gait deviations.  Pt will benefit from skilled OP neuro in order to address deficits.      History and Personal Factors relevant to plan of care:  see above    Clinical Presentation  Evolving    Clinical Presentation due to:  see above    Clinical Decision Making  Moderate    Rehab Potential  Good    Clinical Impairments Affecting Rehab Potential  neuropathy, uncertain of reason for adipose tissue in BLEs, chronic small vessel disease    PT Frequency  2x / week    PT Duration  8 weeks   note that she will miss next week 2/2 holiday, so POC for 9 weeks   PT Treatment/Interventions  ADLs/Self Care Home Management;Aquatic Therapy;DME Instruction;Gait training;Stair training;Functional mobility training;Therapeutic activities;Therapeutic exercise;Balance training;Neuromuscular re-education;Patient/family education;Orthotic Fit/Training;Scar mobilization;Passive range of motion;Dry  needling;Vestibular    PT Next Visit Plan  Talk to her about aquatic PT? 6MWT (with rollator),DGI-update goal as needed, seated hamstring stretch, get feedback about positional testing for vertigo, give HEP for BLE strength, balance, AFO for LLE?    Recommended Other Services  OT order??    Consulted and Agree with Plan of Care  Patient       Patient will benefit from skilled therapeutic intervention in order to improve the following deficits and impairments:  Decreased activity tolerance, Decreased balance, Decreased coordination, Decreased endurance, Decreased knowledge of use of DME, Decreased mobility, Decreased range of motion, Decreased strength, Difficulty walking, Dizziness, Hypomobility, Impaired flexibility, Impaired perceived functional ability, Impaired sensation, Impaired UE functional use, Postural dysfunction  Visit Diagnosis: Unsteadiness on feet  Muscle weakness (generalized)  Repeated falls  Abnormal posture  Other abnormalities of gait and mobility     Problem List Patient Active Problem List   Diagnosis Date Noted  . Gait abnormality 07/10/2018  . S/P reverse total shoulder arthroplasty, 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  . Persistent atrial fibrillation   . Bilateral ureteral calculi 11/17/2017  . Atrial fibrillation with RVR ([place]) 09/15/2017  . Atrial flutter ([place]) 07/17/2017  . Port catheter in place 04/02/2017  . Non-Hodgkin lymphoma, unspecified, intrathoracic lymph nodes ([place]) 02/13/2017  . Mediastinal mass 02/06/2017  . 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 Abnormal chest x-ray   . Lung mass 02/03/2017  . 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  . OSA (obstructive sleep apnea) 02/03/2015  . History of renal calculi 11/16/2014  . Renal calculi 11/16/2014  . Family history of colon cancer 10/26/2014  . Mechanical complication due to cardiac pacemaker pulse generator 11/06/2013  . Dyspnea 05/12/2013  . Hypothyroidism 04/21/2013  . Pleural effusion  04/19/2013  . Long term (current) use of anticoagulants 12/20/2010  . EPIDERMOID CYST 08/22/2010  . Chronic diastolic heart failure ([place]) 08/06/2010  . SYNCOPE AND COLLAPSE 07/27/2010  . OSTEOARTHRITIS, KNEE, RIGHT 03/15/2010  . Morbid obesity ([place]) 12/07/2009  . NONSPEC ELEVATION OF LEVELS OF TRANSAMINASE/LDH 09/08/2009  . BREAST CANCER, HX OF 07/25/2009  . COLONIC POLYPS, HX OF 07/25/2009  . TUBULOVILLOUS ADENOMA, COLON 04/29/2008  . HYPERGLYCEMIA, FASTING 04/29/2008  . HYPERLIPIDEMIA 06/22/2007  . CARCINOMA, THYROID GLAND, HX OF 06/22/2007    Cameron Sprang, PT, MPT [facility] [address] [place], [place], [address] Phone: [phone]   Fax:  [phone] 08/14/18, 3:15 PM  Name: Kayla Harrison MRN: [id number] Date of Birth: 01/06/1946

## 2018-08-21 ENCOUNTER — Ambulatory Visit (HOSPITAL_COMMUNITY)
Admission: RE | Admit: 2018-08-21 | Discharge: 2018-08-21 | Disposition: A | Discharge status: Home / Self Care | Payer: Medicare Other | Source: Ambulatory Visit | Attending: Neurology | Admitting: Neurology

## 2018-08-21 DIAGNOSIS — M5136 Other intervertebral disc degeneration, lumbar region: Secondary | ICD-10-CM | POA: Insufficient documentation

## 2018-08-21 DIAGNOSIS — M6281 Muscle weakness (generalized): Secondary | ICD-10-CM | POA: Diagnosis not present

## 2018-08-21 DIAGNOSIS — R29898 Other symptoms and signs involving the musculoskeletal system: Secondary | ICD-10-CM | POA: Diagnosis not present

## 2018-08-21 DIAGNOSIS — G603 Idiopathic progressive neuropathy: Secondary | ICD-10-CM | POA: Insufficient documentation

## 2018-08-21 DIAGNOSIS — R270 Ataxia, unspecified: Secondary | ICD-10-CM

## 2018-09-01 ENCOUNTER — Ambulatory Visit: Payer: Medicare Other | Attending: Neurology | Admitting: Rehabilitation

## 2018-09-01 ENCOUNTER — Encounter: Payer: Self-pay | Admitting: Rehabilitation

## 2018-09-01 DIAGNOSIS — R2689 Other abnormalities of gait and mobility: Secondary | ICD-10-CM | POA: Insufficient documentation

## 2018-09-01 DIAGNOSIS — R293 Abnormal posture: Secondary | ICD-10-CM | POA: Insufficient documentation

## 2018-09-01 DIAGNOSIS — R296 Repeated falls: Secondary | ICD-10-CM | POA: Diagnosis not present

## 2018-09-01 DIAGNOSIS — R2681 Unsteadiness on feet: Secondary | ICD-10-CM | POA: Diagnosis not present

## 2018-09-01 DIAGNOSIS — M6281 Muscle weakness (generalized): Secondary | ICD-10-CM | POA: Insufficient documentation

## 2018-09-01 NOTE — Patient Instructions (Signed)
Access Code: [email or web address]  URL: [email or web address]  Date: 09/01/2018  Prepared by: Cameron Sprang   Exercises  Sit to Stand with Arms Crossed - 10 reps - 1 sets - 2x daily - 7x weekly

## 2018-09-01 NOTE — Therapy (Signed)
[facility] [address] [place], [place], [address] Phone: [phone]   Fax:  [phone]  Physical Therapy Treatment  Patient Details  Name: Kayla Harrison MRN: [id number] Date of Birth: 1945/12/13 Referring Provider (PT): Sarina Ill, MD   Encounter Date: 09/01/2018  PT End of Session - 09/01/18 1538    Visit Number  2    Number of Visits  17    Date for PT Re-Evaluation  11/12/18   only plan to see for 60 days, note one week delay in start 2/2 holiday)   Authorization Type  Medicare and [employer]-needs 10th visit progress note    PT Start Time  1317    PT Stop Time  1402    PT Time Calculation (min)  45 min    Activity Tolerance  Patient tolerated treatment well    Behavior During Therapy  [facility] for tasks assessed/performed       Past Medical History:  Diagnosis Date  . Anemia   . Arthritis    osteoarthritis - knees and right shoulder  . Atrial fibrillation [facility])    ablation- 2x's-- 1st time- [facility], 2nd event at [facility] in 2008. Convergent ablation at [facility] 6/14  . Atrial fibrillation ([place])   . Blood transfusion without reported diagnosis   . Breast cancer ([place])    Dr Margot Chimes, total thyroidectomy- 1999- for cancer  . 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 Chronic bilateral pleural effusions   . Colon polyp    Dr Earlean Shawl  . Complete heart block ([place])   . Complication of anesthesia    Ketamine produces LSD reaction, bright colored nightmarish experience   . Dyslipidemia   . Dyspnea   . Endometriosis   . Fibroids   . H/O pleural effusion    s/p thoracentesis w 3258m withdrawn  . Hepatitis    [place] ?hepatitis   . History of dysphagia    due to radiation therapy  . History of hiatal hernia    small noted on PET scan  . History of kidney stones   . Hx of thyroid cancer    Dr SForde Dandy . Hyperlipidemia   . Hypertension   . Hypothyroidism   . Lung cancer, lower lobe  ([place] 01/2017   radiation RX completed 03/04/17; will start chemo 6/27, pt unaware of lung cancer  . Morbid obesity ([place]    Status post lap band surgery  . Nephrolithiasis   . Non Hodgkin's lymphoma ([place]    on chemotherapy  . Persistent atrial fibrillation    a. s/p PVI 2008 b. s/p convergent ablation [id number] by bradycardia requiring pacemaker implant  . Presence of permanent cardiac pacemaker   . Rotator cuff tear    Right  . Sinus node dysfunction ([place]    Complicating convergent ablation 6/14  . [place]    [place]  . SVC syndrome    with lung mass and non hodgkins lymphoma  . Thyroid cancer ([place] 2000    Past Surgical History:  Procedure Laterality Date  . ABDOMINAL HYSTERECTOMY  1983  . afib ablation     a. 2008 PVI b. 2014 convergent ablation  . APPENDECTOMY    . BONE MARROW BIOPSY  02/21/2017  . BREAST LUMPECTOMY Left 2010  . bso  1998  . CARDIAC CATHETERIZATION     2015- negative  . CARDIOVERSION  10/09/2012   Procedure: CARDIOVERSION;  Surgeon: JMinus Breeding MD;  Location: [facility];  Service: Cardiovascular;  Laterality: N/A;  . CARDIOVERSION  10/09/2012   Procedure: CARDIOVERSION;  Surgeon: Minus Breeding, MD;  Location: [facility] ENDOSCOPY;  Service: Cardiovascular;  Laterality: N/A;  [name] gave the ok to add pt to the add on , but we must check to find out if the can add pt on at 1400 ( [phone])  . CARDIOVERSION N/A 11/20/2012   Procedure: CARDIOVERSION;  Surgeon: Fay Records, MD;  Location: [facility] ENDOSCOPY;  Service: Cardiovascular;  Laterality: N/A;  . CARDIOVERSION N/A 07/18/2017   Procedure: CARDIOVERSION;  Surgeon: Thayer Headings, MD;  [facility] ENDOSCOPY;  Service: Cardiovascular;  Laterality: N/A;  . CARDIOVERSION N/A 10/03/2017   Procedure: CARDIOVERSION;  Surgeon: Sanda Klein, MD;  Location: [facility] ENDOSCOPY;  Service: Cardiovascular;  Laterality: N/A;  . CARDIOVERSION N/A 01/07/2018   Procedure: CARDIOVERSION;  Surgeon: Acie Fredrickson Wonda Cheng, MD;   Location: [facility];  Service: Cardiovascular;  Laterality: N/A;  . CHOLECYSTECTOMY    . COLONOSCOPY W/ POLYPECTOMY     Dr Earlean Shawl  . CYSTOSCOPY N/A 02/06/2015   Procedure: CYSTOSCOPY;  Surgeon: Kathie Rhodes, MD;  Location: [facility];  Service: Urology;  Laterality: N/A;  . CYSTOSCOPY W/ RETROGRADES Left 11/17/2017   Procedure: CYSTOSCOPY WITH RETROGRADE /PYELOGRAM/;  Surgeon: Kathie Rhodes, MD;  Location: [facility];  Service: Urology;  Laterality: Left;  . CYSTOSCOPY WITH RETROGRADE PYELOGRAM, URETEROSCOPY AND STENT PLACEMENT Right 02/06/2015   Procedure: RETROGRADE PYELOGRAM, RIGHT URETEROSCOPY STENT PLACEMENT;  Surgeon: Kathie Rhodes, MD;  Location: [facility];  Service: Urology;  Laterality: Right;  . CYSTOSCOPY WITH RETROGRADE PYELOGRAM, URETEROSCOPY AND STENT PLACEMENT Right 03/07/2017   Procedure: CYSTOSCOPY WITH RIGHT RETROGRADE PYELOGRAM,RIGHT  URETEROSCOPYLASER LITHOTRIPSY  AND STENT PLACEMENT AND STONE BASKETRY;  Surgeon: Kathie Rhodes, MD;  Location: [facility];  Service: Urology;  Laterality: Right;  . EYE SURGERY     cataract surgery  . fatty mass removal  1999   pubic area  . HOLMIUM LASER APPLICATION N/A 12/30/1446   Procedure: HOLMIUM LASER APPLICATION;  Surgeon: Kathie Rhodes, MD;  [facility]: [facility];  Service: Urology;  Laterality: N/A;  . HOLMIUM LASER APPLICATION Right 1/85/6314   Procedure: HOLMIUM LASER APPLICATION;  Surgeon: Kathie Rhodes, MD;  Location: [facility];  Service: Urology;  Laterality: Right;  . HOLMIUM LASER APPLICATION Left 9/70/2637   Procedure: HOLMIUM LASER APPLICATION;  Surgeon: Kathie Rhodes, MD;  [facility];  Service: Urology;  Laterality: Left;  . IR FLUORO GUIDE PORT INSERTION RIGHT  02/24/2017  . IR NEPHROSTOMY PLACEMENT RIGHT  11/17/2017  . IR PATIENT EVAL TECH 0-60 MINS  03/11/2017  . IR REMOVAL TUN ACCESS W/ PORT W/O FL MOD SED  04/20/2018  . IR US GUIDE VASC ACCESS RIGHT  02/24/2017  . KNEE ARTHROSCOPY     bilateral  .  LAPAROSCOPIC GASTRIC BANDING  07/10/2010  . LAPAROSCOPIC GASTRIC BANDING     Laparoscopic adjustable banding APS System with posterior hiatal hernia, 2 suture.  Marland Kitchen LAPAROTOMY     for ruptured ovary and ovarian artery   . NEPHROLITHOTOMY Right 11/17/2017   Procedure: NEPHROLITHOTOMY PERCUTANEOUS;  Surgeon: Kathie Rhodes, MD;  [facility];  Service: Urology;  Laterality: Right;  . PACEMAKER INSERTION  03/10/2013   MDT dual chamber PPM  . POCKET REVISION N/A 12/08/2013   Procedure: POCKET REVISION;  Surgeon: Deboraha Sprang, MD;  [facility] LAB;  Service: Cardiovascular;  Laterality: N/A;  . PORTA CATH INSERTION    . REVERSE  SHOULDER ARTHROPLASTY Right 05/14/2018   Procedure: RIGHT REVERSE SHOULDER ARTHROPLASTY;  Surgeon: Tania Ade, MD;  Location: [facility];  Service: Orthopedics;  Laterality: Right;  . REVERSE SHOULDER REPLACEMENT Right 05/14/2018  . TEE WITH CARDIOVERSION  09/22/2017  . TEE WITHOUT CARDIOVERSION N/A 10/03/2017   Procedure: TRANSESOPHAGEAL ECHOCARDIOGRAM (TEE);  Surgeon: Sanda Klein, MD;  Location: [facility];  Service: Cardiovascular;  Laterality: N/A;  . THYROIDECTOMY  1998   Dr Margot Chimes  . TONSILLECTOMY    . TOTAL KNEE ARTHROPLASTY  04/13/2012   Procedure: TOTAL KNEE ARTHROPLASTY;  Surgeon: Rudean Haskell, MD;  Location: [facility] [place];  Service: Orthopedics;  Laterality: Right;  Marland Kitchen VIDEO BRONCHOSCOPY WITH ENDOBRONCHIAL ULTRASOUND N/A 02/07/2017   Procedure: VIDEO BRONCHOSCOPY WITH ENDOBRONCHIAL ULTRASOUND;  Surgeon: Marshell Garfinkel, MD;  Location: [facility];  Service: Pulmonary;  Laterality: N/A;    There were no vitals filed for this visit.  Subjective Assessment - 09/01/18 1320    Subjective  Pt reports she had a fall Friday night.  Was bending forward to hold rug and fell.  No injury, shoulder is still sore.     Pertinent History  lymphoma (had chemotherapy), vertigo, small vessel disease, arthritis (has had R knee and reverse total shoulder on R)    Limitations   Walking;House hold activities    Patient Stated Goals  "I want to be able to walk in a normal fashion"     Currently in Pain?  No/denies         [facility] PT Assessment - 09/01/18 1326      6 Minute Walk- Baseline   6 Minute Walk- Baseline  yes    BP (mmHg)  98/78    HR (bpm)  80    02 Sat (%RA)  98 %    Modified Borg Scale for Dyspnea  0- Nothing at all    Perceived Rate of Exertion (Borg)  6-      6 Minute walk- Post Test   6 Minute Walk Post Test  yes    BP (mmHg)  118/80    HR (bpm)  82    02 Sat (%RA)  98 %    Modified Borg Scale for Dyspnea  0.5- Very, very slight shortness of breath    Perceived Rate of Exertion (Borg)  11- Fairly light      6 minute walk test results    Aerobic Endurance Distance Walked  859    Endurance additional comments  with SPC, no rest breaks      Standardized Balance Assessment   Standardized Balance Assessment  Dynamic Gait Index      Dynamic Gait Index   Level Surface  Normal    Change in Gait Speed  Mild Impairment    Gait with Horizontal Head Turns  Mild Impairment    Gait with Vertical Head Turns  Moderate Impairment    Gait and Pivot Turn  Mild Impairment    Step Over Obstacle  Moderate Impairment    Step Around Obstacles  Mild Impairment    Steps  Mild Impairment    Total Score  15    DGI comment:  Scores of 19 or less are predictive of falls in older community living adults            Access Code: [email or web address]  URL: [email or web address]  Date: 09/01/2018  Prepared by: Cameron Sprang   Exercises  Sit to Stand with Arms Crossed - 10 reps - 1 sets - 2x daily - 7x weekly  Self care:  Discussed improving endurance and pushing to increase stride length and gait speed as she did not demonstrate any fatigue following 6MWT.  Also educated on where to push on RPE scale to indicate exertion.  Pt verbalized understanding. Also discussed fall recovery and that we would address in future session.             PT Education - 09/01/18 1537    Education Details  6MWT results, DGI results, performing sit<>stand for HEP, performing floor transfer at next visit    Person(s) Educated  Patient    Methods  Explanation    Comprehension  Verbalized understanding       PT Short Term Goals - 09/01/18 1540      PT SHORT TERM GOAL #1   Title  Pt will be independent with initial HEP in order to indicate decreased fall risk and improved functional mobility (Target Date: 09/20/18)    Time  5    Period  Weeks    Status  New      PT SHORT TERM GOAL #2   Title  Pt will perform DGI and improve score 3 points from baseline in order to indicate decreased fall risk.      Baseline  15/24 on 12/10    Time  5    Period  Weeks    Status  New      PT SHORT TERM GOAL #3   Title  Pt will improve gait speed to >/=2.62 ft/sec w/ LRAD in order to indicate safe community ambulator.     Time  5    Period  Weeks    Status  New      PT SHORT TERM GOAL #4   Title  Will assess need for AFO/support for LLE and request order as appropriate.      Time  5    Period  Weeks    Status  New      PT SHORT TERM GOAL #5   Title  Will assess 6MWT and improve distance by 150' in order to indicate improved functional endurance.     Baseline  859' on 12/10    Time  5    Period  Weeks    Status  New      PT SHORT TERM GOAL #6   Title  Pt will perform floor recovery at mod A level in order to indiciate improved ability to get up in case of fall.      Time  5    Period  Weeks    Status  New      PT SHORT TERM GOAL #7   Title  Pt will negotiate up/down curb step with LRAD at mod I level in order to indicate safe community negotiation.     Time  5    Period  Weeks    Status  New        PT Long Term Goals - 08/14/18 1508      PT LONG TERM GOAL #1   Title  Pt will be independent with final HEP in order to indicate decreased fall risk and improved functional mobility.  (Target Date: 10/20/18    Time  9     Period  Weeks    Status  New    Target Date  10/20/18      PT LONG TERM GOAL #2   Title  Pt will perform 5TSS in <11 secs without UE support and without using backs  of legs on seated surface in order to indicate improved BLE strength.      Time  9    Period  Weeks    Status  New      PT LONG TERM GOAL #3   Title  Pt will ambulate x 500' w/ LRAD over unlevel outdoor paved surfaces at mod I level in order to indicate safe community mobility.      Time  9    Period  Weeks    Status  New      PT LONG TERM GOAL #4   Title  Pt will improve DGI by 6 points from baseline in order to indicate decreased fall risk.     Time  9    Period  Weeks    Status  New      PT LONG TERM GOAL #5   Title  Pt will perform floor recovery at min A level in order to indicate improved ability to get up from floor in case of fall.     Time  9    Period  Weeks    Status  New      Additional Long Term Goals   Additional Long Term Goals  Yes      PT LONG TERM GOAL #6   Title  Pt will improve 6MWT by 300' from baseline in order to indicate improved functional endurance.     Time  9    Period  Weeks    Status  New            Plan - 09/01/18 1538    Clinical Impression Statement  Skilled session focused on formal assessment of endurance with 6MWT and balance with DGI.  Pt able to ambulate x 859' with SPC during 6MWT indicative of poor functional endurance and demonstrates score of 15/24 on DGI indicative of high fall risk.  Discussed that she would like to be able to get up from the floor in case of another fall.  Will plan to address at next visit.     Rehab Potential  Good    Clinical Impairments Affecting Rehab Potential  neuropathy, uncertain of reason for adipose tissue in BLEs, chronic small vessel disease    PT Frequency  2x / week    PT Duration  8 weeks   note that she will miss next week 2/2 holiday, so POC for 9 weeks   PT Treatment/Interventions  ADLs/Self Care Home Management;Aquatic  Therapy;DME Instruction;Gait training;Stair training;Functional mobility training;Therapeutic activities;Therapeutic exercise;Balance training;Neuromuscular re-education;Patient/family education;Orthotic Fit/Training;Scar mobilization;Passive range of motion;Dry needling;Vestibular    PT Next Visit Plan  Floor recovery (maybe break down into segments) seated hamstring stretch, get feedback about positional testing for vertigo, give HEP for BLE strength, balance, AFO for LLE?    Consulted and Agree with Plan of Care  Patient       Patient will benefit from skilled therapeutic intervention in order to improve the following deficits and impairments:  Decreased activity tolerance, Decreased balance, Decreased coordination, Decreased endurance, Decreased knowledge of use of DME, Decreased mobility, Decreased range of motion, Decreased strength, Difficulty walking, Dizziness, Hypomobility, Impaired flexibility, Impaired perceived functional ability, Impaired sensation, Impaired UE functional use, Postural dysfunction  Visit Diagnosis: Unsteadiness on feet  Muscle weakness (generalized)  Repeated falls  Abnormal posture  Other abnormalities of gait and mobility     Problem List Patient Active Problem List   Diagnosis Date Noted  . Gait abnormality 07/10/2018  . S/P reverse total  shoulder arthroplasty, 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  . Persistent atrial fibrillation   . Bilateral ureteral calculi 11/17/2017  . Atrial fibrillation with RVR ([place]) 09/15/2017  . Atrial flutter ([place]) 07/17/2017  . Port catheter in place 04/02/2017  . Non-Hodgkin lymphoma, unspecified, intrathoracic lymph nodes ([place]) 02/13/2017  . Mediastinal mass 02/06/2017  . 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 Abnormal chest x-ray   . Lung mass 02/03/2017  . 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  . OSA (obstructive sleep apnea) 02/03/2015  . History of renal calculi 11/16/2014  . Renal calculi 11/16/2014  . Family  history of colon cancer 10/26/2014  . Mechanical complication due to cardiac pacemaker pulse generator 11/06/2013  . Dyspnea 05/12/2013  . Hypothyroidism 04/21/2013  . Pleural effusion 04/19/2013  . Long term (current) use of anticoagulants 12/20/2010  . EPIDERMOID CYST 08/22/2010  . Chronic diastolic heart failure ([place]) 08/06/2010  . SYNCOPE AND COLLAPSE 07/27/2010  . OSTEOARTHRITIS, KNEE, RIGHT 03/15/2010  . Morbid obesity ([place]) 12/07/2009  . NONSPEC ELEVATION OF LEVELS OF TRANSAMINASE/LDH 09/08/2009  . BREAST CANCER, HX OF 07/25/2009  . COLONIC POLYPS, HX OF 07/25/2009  . TUBULOVILLOUS ADENOMA, COLON 04/29/2008  . HYPERGLYCEMIA, FASTING 04/29/2008  . HYPERLIPIDEMIA 06/22/2007  . CARCINOMA, THYROID GLAND, HX OF 06/22/2007    Cameron Sprang, PT, MPT [facility] [address] [place], [place], [address] Phone: [phone]   Fax:  [phone] 09/01/18, 3:44 PM  Name: Kayla Harrison MRN: [id number] Date of Birth: 1945/10/05

## 2018-09-03 ENCOUNTER — Ambulatory Visit: Payer: Medicare Other | Admitting: Rehabilitation

## 2018-09-03 ENCOUNTER — Encounter: Payer: Self-pay | Admitting: Rehabilitation

## 2018-09-03 ENCOUNTER — Telehealth: Payer: Self-pay | Admitting: Rehabilitation

## 2018-09-03 DIAGNOSIS — R2689 Other abnormalities of gait and mobility: Secondary | ICD-10-CM

## 2018-09-03 DIAGNOSIS — M6281 Muscle weakness (generalized): Secondary | ICD-10-CM | POA: Diagnosis not present

## 2018-09-03 DIAGNOSIS — R2681 Unsteadiness on feet: Secondary | ICD-10-CM

## 2018-09-03 DIAGNOSIS — R293 Abnormal posture: Secondary | ICD-10-CM

## 2018-09-03 DIAGNOSIS — R296 Repeated falls: Secondary | ICD-10-CM | POA: Diagnosis not present

## 2018-09-03 NOTE — Patient Instructions (Signed)
Access Code: [email or web address]  URL: [email or web address]  Date: 09/03/2018  Prepared by: Cameron Sprang   Exercises  Sit to Stand with Arms Crossed - 10 reps - 1 sets - 2x daily - 7x weekly  Supine Straight Leg Raises - 10 reps - 3 sets - 1x daily - 7x weekly  Clamshell - 10 reps - 1 sets - 2x daily - 7x weekly  Sidelying Hip Extension in Abduction - 10 reps - 1 sets - 2x daily - 7x weekly  Supine Piriformis Stretch with Leg Straight - 2-3 reps - 1 sets - 30 hold - 2-3x daily - 7x weekly

## 2018-09-03 NOTE — Telephone Encounter (Signed)
Dr. Jaynee Eagles,   I am seeing Kayla Harrison here at [facility] neuro for balance and weakness.  She has several areas that would benefit from dry needling, however [name] wanted me to ask you about her adipose tissue and if you felt like dry needling would be okay.  Do you know if this adipose tissue is overlying muscles or superimposed?  We just wanted clearance before adding this to POC.    Thanks,  Cameron Sprang, PT, MPT [facility] [address] [place], [place], [address] Phone: [phone]   Fax:  [phone] 09/03/18, 3:08 PM

## 2018-09-03 NOTE — Therapy (Signed)
[facility] [address] [place], [place], [address] Phone: [phone]   Fax:  [phone]  Physical Therapy Treatment  Patient Details  Name: Kayla Harrison MRN: [id number] Date of Birth: 1945/11/02 Referring Provider (PT): Sarina Ill, MD   Encounter Date: 09/03/2018  PT End of Session - 09/03/18 1520    Visit Number  3    Number of Visits  17    Date for PT Re-Evaluation  11/12/18   only plan to see for 60 days, note one week delay in start 2/2 holiday)   Authorization Type  Medicare and [employer]-needs 10th visit progress note    PT Start Time  1404    PT Stop Time  1450    PT Time Calculation (min)  46 min    Activity Tolerance  Patient tolerated treatment well    Behavior During Therapy  [facility] for tasks assessed/performed       Past Medical History:  Diagnosis Date  . Anemia   . Arthritis    osteoarthritis - knees and right shoulder  . Atrial fibrillation [facility])    ablation- 2x's-- 1st time- [facility], 2nd event at [facility] in 2008. Convergent ablation at [facility] 6/14  . Atrial fibrillation ([place])   . Blood transfusion without reported diagnosis   . Breast cancer ([place])    Dr Margot Chimes, total thyroidectomy- 1999- for cancer  . 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 Chronic bilateral pleural effusions   . Colon polyp    Dr Earlean Shawl  . Complete heart block ([place])   . Complication of anesthesia    Ketamine produces LSD reaction, bright colored nightmarish experience   . Dyslipidemia   . Dyspnea   . Endometriosis   . Fibroids   . H/O pleural effusion    s/p thoracentesis w 3225m withdrawn  . Hepatitis    [place] ?hepatitis   . History of dysphagia    due to radiation therapy  . History of hiatal hernia    small noted on PET scan  . History of kidney stones   . Hx of thyroid cancer    Dr SForde Dandy . Hyperlipidemia   . Hypertension   . Hypothyroidism   . Lung cancer, lower lobe  ([place] 01/2017   radiation RX completed 03/04/17; will start chemo 6/27, pt unaware of lung cancer  . Morbid obesity ([place]    Status post lap band surgery  . Nephrolithiasis   . Non Hodgkin's lymphoma ([place]    on chemotherapy  . Persistent atrial fibrillation    a. s/p PVI 2008 b. s/p convergent ablation [id number] by bradycardia requiring pacemaker implant  . Presence of permanent cardiac pacemaker   . Rotator cuff tear    Right  . Sinus node dysfunction ([place]    Complicating convergent ablation 6/14  . [place]    [place]  . SVC syndrome    with lung mass and non hodgkins lymphoma  . Thyroid cancer ([place] 2000    Past Surgical History:  Procedure Laterality Date  . ABDOMINAL HYSTERECTOMY  1983  . afib ablation     a. 2008 PVI b. 2014 convergent ablation  . APPENDECTOMY    . BONE MARROW BIOPSY  02/21/2017  . BREAST LUMPECTOMY Left 2010  . bso  1998  . CARDIAC CATHETERIZATION     2015- negative  . CARDIOVERSION  10/09/2012   Procedure: CARDIOVERSION;  Surgeon: JMinus Breeding MD;  Location: [facility];  Service: Cardiovascular;  Laterality: N/A;  . CARDIOVERSION  10/09/2012   Procedure: CARDIOVERSION;  Surgeon: Minus Breeding, MD;  Location: [facility] ENDOSCOPY;  Service: Cardiovascular;  Laterality: N/A;  [name] gave the ok to add pt to the add on , but we must check to find out if the can add pt on at [phone] ( [phone])  . CARDIOVERSION N/A 11/20/2012   Procedure: CARDIOVERSION;  Surgeon: Fay Records, MD;  Location: [facility] ENDOSCOPY;  Service: Cardiovascular;  Laterality: N/A;  . CARDIOVERSION N/A 07/18/2017   Procedure: CARDIOVERSION;  Surgeon: Thayer Headings, MD;  [facility] ENDOSCOPY;  Service: Cardiovascular;  Laterality: N/A;  . CARDIOVERSION N/A 10/03/2017   Procedure: CARDIOVERSION;  Surgeon: Sanda Klein, MD;  Location: [facility] ENDOSCOPY;  Service: Cardiovascular;  Laterality: N/A;  . CARDIOVERSION N/A 01/07/2018   Procedure: CARDIOVERSION;  Surgeon: Acie Fredrickson Wonda Cheng, MD;   Location: [facility];  Service: Cardiovascular;  Laterality: N/A;  . CHOLECYSTECTOMY    . COLONOSCOPY W/ POLYPECTOMY     Dr Earlean Shawl  . CYSTOSCOPY N/A 02/06/2015   Procedure: CYSTOSCOPY;  Surgeon: Kathie Rhodes, MD;  [facility];  Service: Urology;  Laterality: N/A;  . CYSTOSCOPY W/ RETROGRADES Left 11/17/2017   Procedure: CYSTOSCOPY WITH RETROGRADE /PYELOGRAM/;  Surgeon: Kathie Rhodes, MD;  Location: [facility];  Service: Urology;  Laterality: Left;  . CYSTOSCOPY WITH RETROGRADE PYELOGRAM, URETEROSCOPY AND STENT PLACEMENT Right 02/06/2015   Procedure: RETROGRADE PYELOGRAM, RIGHT URETEROSCOPY STENT PLACEMENT;  Surgeon: Kathie Rhodes, MD;  [facility];  Service: Urology;  Laterality: Right;  . CYSTOSCOPY WITH RETROGRADE PYELOGRAM, URETEROSCOPY AND STENT PLACEMENT Right 03/07/2017   Procedure: CYSTOSCOPY WITH RIGHT RETROGRADE PYELOGRAM,RIGHT  URETEROSCOPYLASER LITHOTRIPSY  AND STENT PLACEMENT AND STONE BASKETRY;  Surgeon: Kathie Rhodes, MD;  Location: [facility];  Service: Urology;  Laterality: Right;  . EYE SURGERY     cataract surgery  . fatty mass removal  1999   pubic area  . HOLMIUM LASER APPLICATION N/A 12/30/1446   Procedure: HOLMIUM LASER APPLICATION;  Surgeon: Kathie Rhodes, MD;  [facility];  Service: Urology;  Laterality: N/A;  . HOLMIUM LASER APPLICATION Right 1/85/6314   Procedure: HOLMIUM LASER APPLICATION;  Surgeon: Kathie Rhodes, MD;  Location: [facility];  Service: Urology;  Laterality: Right;  . HOLMIUM LASER APPLICATION Left 9/70/2637   Procedure: HOLMIUM LASER APPLICATION;  Surgeon: Kathie Rhodes, MD;  [facility];  Service: Urology;  Laterality: Left;  . IR FLUORO GUIDE PORT INSERTION RIGHT  02/24/2017  . IR NEPHROSTOMY PLACEMENT RIGHT  11/17/2017  . IR PATIENT EVAL TECH 0-60 MINS  03/11/2017  . IR REMOVAL TUN ACCESS W/ PORT W/O FL MOD SED  04/20/2018  . IR US GUIDE VASC ACCESS RIGHT  02/24/2017  . KNEE ARTHROSCOPY     bilateral  .  LAPAROSCOPIC GASTRIC BANDING  07/10/2010  . LAPAROSCOPIC GASTRIC BANDING     Laparoscopic adjustable banding APS System with posterior hiatal hernia, 2 suture.  Marland Kitchen LAPAROTOMY     for ruptured ovary and ovarian artery   . NEPHROLITHOTOMY Right 11/17/2017   Procedure: NEPHROLITHOTOMY PERCUTANEOUS;  Surgeon: Kathie Rhodes, MD;  [facility];  Service: Urology;  Laterality: Right;  . PACEMAKER INSERTION  03/10/2013   MDT dual chamber PPM  . POCKET REVISION N/A 12/08/2013   Procedure: POCKET REVISION;  Surgeon: Deboraha Sprang, MD;  [facility] C[facility] LAB;  Service: Cardiovascular;  Laterality: N/A;  . PORTA CATH INSERTION    . REVERSE  SHOULDER ARTHROPLASTY Right 05/14/2018   Procedure: RIGHT REVERSE SHOULDER ARTHROPLASTY;  Surgeon: Tania Ade, MD;  Location: [place] [facility];  Service: Orthopedics;  Laterality: Right;  . REVERSE SHOULDER REPLACEMENT Right 05/14/2018  . TEE WITH CARDIOVERSION  09/22/2017  . TEE WITHOUT CARDIOVERSION N/A 10/03/2017   Procedure: TRANSESOPHAGEAL ECHOCARDIOGRAM (TEE);  Surgeon: Sanda Klein, MD;  Location: [facility];  Service: Cardiovascular;  Laterality: N/A;  . THYROIDECTOMY  1998   Dr Margot Chimes  . TONSILLECTOMY    . TOTAL KNEE ARTHROPLASTY  04/13/2012   Procedure: TOTAL KNEE ARTHROPLASTY;  Surgeon: Rudean Haskell, MD;  Location: [place];  Service: Orthopedics;  Laterality: Right;  Marland Kitchen VIDEO BRONCHOSCOPY WITH ENDOBRONCHIAL ULTRASOUND N/A 02/07/2017   Procedure: VIDEO BRONCHOSCOPY WITH ENDOBRONCHIAL ULTRASOUND;  Surgeon: Marshell Garfinkel, MD;  Location: [facility];  Service: Pulmonary;  Laterality: N/A;    There were no vitals filed for this visit.  Subjective Assessment - 09/03/18 1410    Subjective  "I've been so busy today so I don't know how I'll do getting up from the floor"     Pertinent History  lymphoma (had chemotherapy), vertigo, small vessel disease, arthritis (has had R knee and reverse total shoulder on R)    Limitations  Walking;House hold activities     Patient Stated Goals  "I want to be able to walk in a normal fashion"     Currently in Pain?  No/denies                       [facility] Adult PT Treatment/Exercise - 09/03/18 1405      Self-Care   Self-Care  Other Self-Care Comments    Other Self-Care Comments   Pt/PT problem solving how to go about performing floor recovery.  Pt reports she is unable to get onto knees due to increased pain from arthritis.  Asked if pt felt she could get onto knees if had padding.  She was still unable to get onto knees on mat due to inability to lift LLE.  Also discussed boosting up to step stool using more UE support.  Pt reports she would be unable to do this due to UE weakness.  Discussed doing more LE/UE strengthening before attemping to perform again.  Once getting onto mat and performing therex , note several areas of tightness and trigger points along lateral quad and IT band (TFL) with likely bursitis.  Did not perform ITB rolling this session due to time constraint, however does not have assist at home.  Discussed dry needling and had [name] briefly assess.  Will get clearance from MD due to adipose tissue in BLEs before proceeding.        Therapeutic Activites    Therapeutic Activities  Other Therapeutic Activities    Other Therapeutic Activities  Skilled palpation to R hip and note marked tenderness over greater trochanter and along TFL/IT band with several trigger points noted.        Exercises   Exercises  Knee/Hip      Knee/Hip Exercises: Stretches   ITB Stretch  Right;1 rep;30 seconds   in supine knee to opposite shoulder   ITB Stretch Limitations  Also performed supine straight leg across body, however pt felt more stretch with knee to shoulder      Knee/Hip Exercises: Standing   Hip Abduction  Stengthening;Both;1 set;10 reps    Abduction Limitations  Max cues for slower speed of movement     Other Standing Knee Exercises  Side stepping  in mini squat x 2 laps in // bars       Knee/Hip Exercises: Supine   Straight Leg Raises  Strengthening;Both;1 set;10 reps    Straight Leg Raises Limitations  added to HEP      Knee/Hip Exercises: Sidelying   Hip ADduction  Strengthening;Right;1 set;10 reps    Hip ADduction Limitations  tactile cues at hip to remain forward and cues for keeping toes pointed forward    Clams  Pt only able to do clam on RLE (not straight leg lift due to weakness in LLE) x 10 reps         Access Code: [email or web address]  URL: [email or web address]  Date: 09/03/2018  Prepared by: Cameron Sprang   Exercises  Sit to Stand with Arms Crossed - 10 reps - 1 sets - 2x daily - 7x weekly  Supine Straight Leg Raises - 10 reps - 3 sets - 1x daily - 7x weekly  Clamshell - 10 reps - 1 sets - 2x daily - 7x weekly  Sidelying Hip Extension in Abduction - 10 reps - 1 sets - 2x daily - 7x weekly  Supine Piriformis Stretch with Leg Straight - 2-3 reps - 1 sets - 30 hold - 2-3x daily - 7x weekly      PT Education - 09/03/18 1520    Education Details  see self care, additional HEP exercises    Person(s) Educated  Patient    Methods  Explanation    Comprehension  Verbalized understanding       PT Short Term Goals - 09/01/18 1540      PT SHORT TERM GOAL #1   Title  Pt will be independent with initial HEP in order to indicate decreased fall risk and improved functional mobility (Target Date: 09/20/18)    Time  5    Period  Weeks    Status  New      PT SHORT TERM GOAL #2   Title  Pt will perform DGI and improve score 3 points from baseline in order to indicate decreased fall risk.      Baseline  15/24 on 12/10    Time  5    Period  Weeks    Status  New      PT SHORT TERM GOAL #3   Title  Pt will improve gait speed to >/=2.62 ft/sec w/ LRAD in order to indicate safe community ambulator.     Time  5    Period  Weeks    Status  New      PT SHORT TERM GOAL #4   Title  Will assess need for AFO/support for LLE and request order as appropriate.       Time  5    Period  Weeks    Status  New      PT SHORT TERM GOAL #5   Title  Will assess 6MWT and improve distance by 150' in order to indicate improved functional endurance.     Baseline  859' on 12/10    Time  5    Period  Weeks    Status  New      PT SHORT TERM GOAL #6   Title  Pt will perform floor recovery at mod A level in order to indiciate improved ability to get up in case of fall.      Time  5    Period  Weeks    Status  New      PT SHORT TERM  GOAL #7   Title  Pt will negotiate up/down curb step with LRAD at mod I level in order to indicate safe community negotiation.     Time  5    Period  Weeks    Status  New        PT Long Term Goals - 08/14/18 1508      PT LONG TERM GOAL #1   Title  Pt will be independent with final HEP in order to indicate decreased fall risk and improved functional mobility.  (Target Date: 10/20/18    Time  9    Period  Weeks    Status  New    Target Date  10/20/18      PT LONG TERM GOAL #2   Title  Pt will perform 5TSS in <11 secs without UE support and without using backs of legs on seated surface in order to indicate improved BLE strength.      Time  9    Period  Weeks    Status  New      PT LONG TERM GOAL #3   Title  Pt will ambulate x 500' w/ LRAD over unlevel outdoor paved surfaces at mod I level in order to indicate safe community mobility.      Time  9    Period  Weeks    Status  New      PT LONG TERM GOAL #4   Title  Pt will improve DGI by 6 points from baseline in order to indicate decreased fall risk.     Time  9    Period  Weeks    Status  New      PT LONG TERM GOAL #5   Title  Pt will perform floor recovery at min A level in order to indicate improved ability to get up from floor in case of fall.     Time  9    Period  Weeks    Status  New      Additional Long Term Goals   Additional Long Term Goals  Yes      PT LONG TERM GOAL #6   Title  Pt will improve 6MWT by 300' from baseline in order to indicate  improved functional endurance.     Time  9    Period  Weeks    Status  New            Plan - 09/03/18 1521    Clinical Impression Statement  Skilled session focused on discussion of floor recovery however feel that we will need to work on more strengthening before this can be done safely.  Also continue to add to HEP for BLE strengthening.  Also note areas of tigtness that could benefit from dry needling, however will wait for clearance from MD before proceeding with DN.      Rehab Potential  Good    Clinical Impairments Affecting Rehab Potential  neuropathy, uncertain of reason for adipose tissue in BLEs, chronic small vessel disease    PT Frequency  2x / week    PT Duration  8 weeks   note that she will miss next week 2/2 holiday, so POC for 9 weeks   PT Treatment/Interventions  ADLs/Self Care Home Management;Aquatic Therapy;DME Instruction;Gait training;Stair training;Functional mobility training;Therapeutic activities;Therapeutic exercise;Balance training;Neuromuscular re-education;Patient/family education;Orthotic Fit/Training;Scar mobilization;Passive range of motion;Dry needling;Vestibular    PT Next Visit Plan  check for horizontal canal BPPV, check orthostatics with supine>sit, did we get clearance for  DN, UE strength, give HEP for BLE strength, balance, AFO for LLE?    PT Home Exercise Plan  [email or web address]    Consulted and Agree with Plan of Care  Patient       Patient will benefit from skilled therapeutic intervention in order to improve the following deficits and impairments:  Decreased activity tolerance, Decreased balance, Decreased coordination, Decreased endurance, Decreased knowledge of use of DME, Decreased mobility, Decreased range of motion, Decreased strength, Difficulty walking, Dizziness, Hypomobility, Impaired flexibility, Impaired perceived functional ability, Impaired sensation, Impaired UE functional use, Postural dysfunction  Visit Diagnosis: Unsteadiness on  feet  Muscle weakness (generalized)  Repeated falls  Abnormal posture  Other abnormalities of gait and mobility     Problem List Patient Active Problem List   Diagnosis Date Noted  . Gait abnormality 07/10/2018  . S/P reverse total shoulder arthroplasty, 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  . Persistent atrial fibrillation   . Bilateral ureteral calculi 11/17/2017  . Atrial fibrillation with RVR ([place]) 09/15/2017  . Atrial flutter ([place]) 07/17/2017  . Port catheter in place 04/02/2017  . Non-Hodgkin lymphoma, unspecified, intrathoracic lymph nodes ([place]) 02/13/2017  . Mediastinal mass 02/06/2017  . 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 Abnormal chest x-ray   . Lung mass 02/03/2017  . 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  . OSA (obstructive sleep apnea) 02/03/2015  . History of renal calculi 11/16/2014  . Renal calculi 11/16/2014  . Family history of colon cancer 10/26/2014  . Mechanical complication due to cardiac pacemaker pulse generator 11/06/2013  . Dyspnea 05/12/2013  . Hypothyroidism 04/21/2013  . Pleural effusion 04/19/2013  . Long term (current) use of anticoagulants 12/20/2010  . EPIDERMOID CYST 08/22/2010  . Chronic diastolic heart failure ([place]) 08/06/2010  . SYNCOPE AND COLLAPSE 07/27/2010  . OSTEOARTHRITIS, KNEE, RIGHT 03/15/2010  . Morbid obesity ([place]) 12/07/2009  . NONSPEC ELEVATION OF LEVELS OF TRANSAMINASE/LDH 09/08/2009  . BREAST CANCER, HX OF 07/25/2009  . COLONIC POLYPS, HX OF 07/25/2009  . TUBULOVILLOUS ADENOMA, COLON 04/29/2008  . HYPERGLYCEMIA, FASTING 04/29/2008  . HYPERLIPIDEMIA 06/22/2007  . CARCINOMA, THYROID GLAND, HX OF 06/22/2007    Cameron Sprang, PT, MPT [facility] [address] [place], [place], [address] Phone: [phone]   Fax:  [phone] 09/03/18, 3:38 PM  Name: NICLOE FRONTERA MRN: [id number] Date of Birth: 1946/02/13

## 2018-09-06 IMAGING — CT CT CHEST W/ CM
2 of 3 series · 15 of 36 positions shown, 18 images · IV contrast (APPLIED)
Comparison: 02/03/2017 and CT of the chest on 04/21/2013

CLINICAL DATA: Pt c/o difficulty breathing dizziness syncope, Afib
off and on but on almost all day today. History of thyroid cancer
and breast cancer.

EXAM:
CT CHEST WITH CONTRAST
TECHNIQUE: Multidetector CT imaging of the chest was performed during
intravenous contrast administration.
CONTRAST:  75mL DR0EHD-IMM IOPAMIDOL (DR0EHD-IMM) INJECTION 61%

[Series 3: thorax 2.0 i31f 2 · axial · 0.69mm/px · z∈[+1126,+1394]mm · 12 of 158 slices shown, 15 images]
[im 12/158  mediastinal]
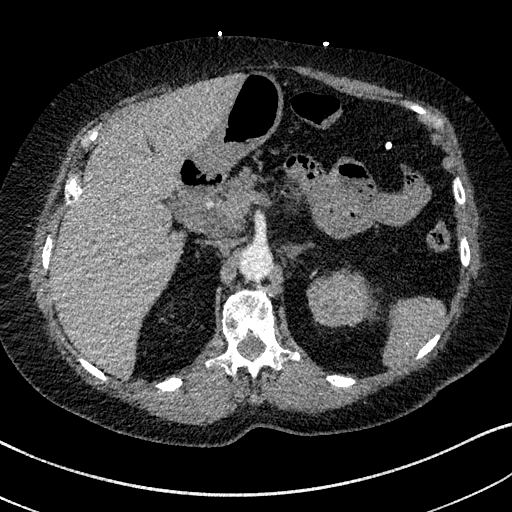
[im 12/158  lung]
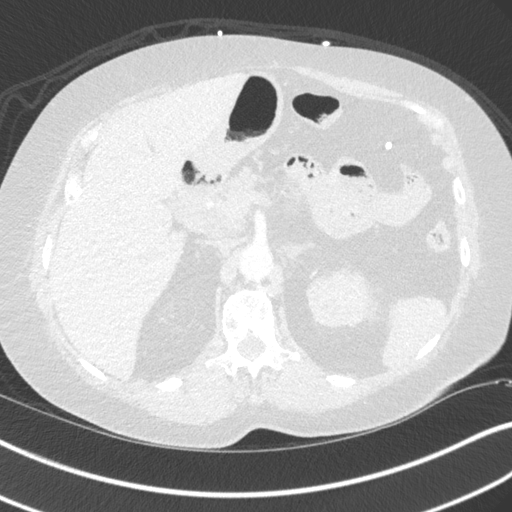
[im 24/158  lung]
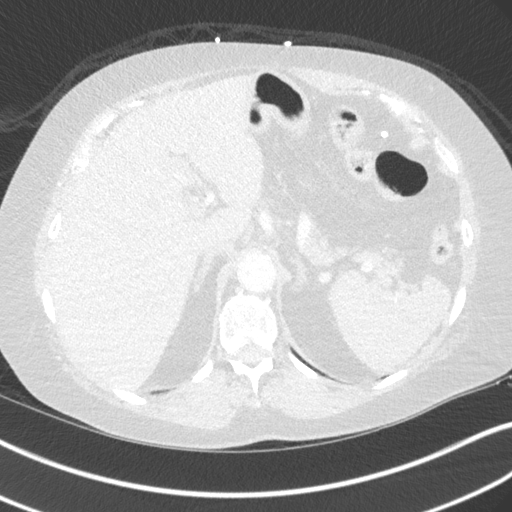
[im 35/158  lung]
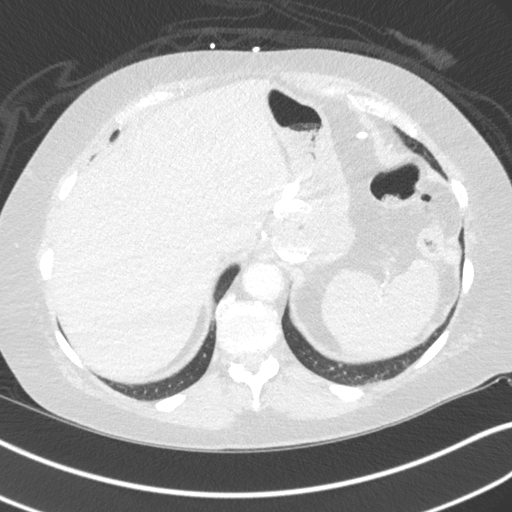
[im 47/158  lung]
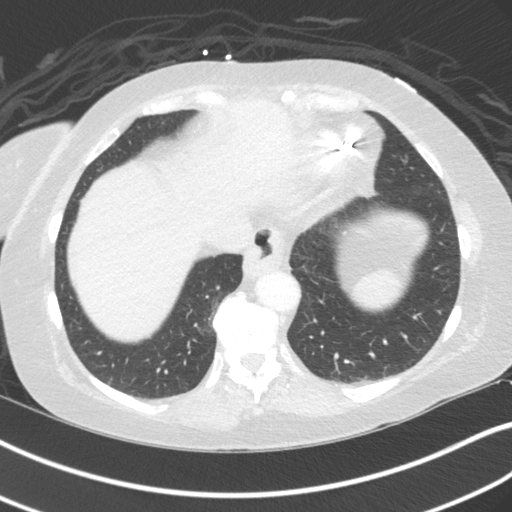
[im 59/158  mediastinal]
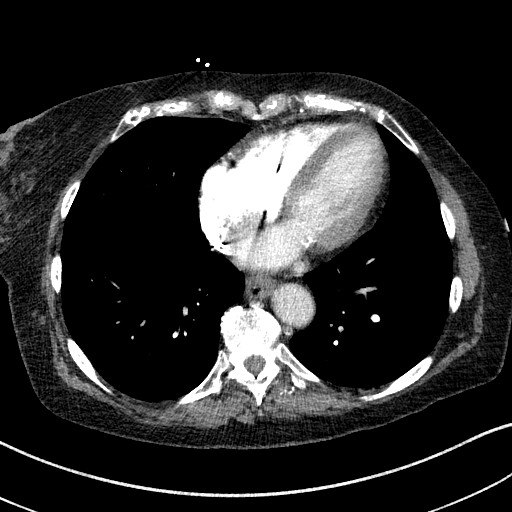
[im 59/158  lung]
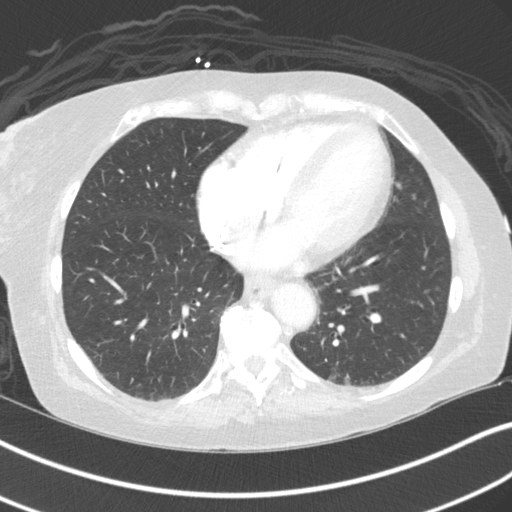
[im 70/158  lung]
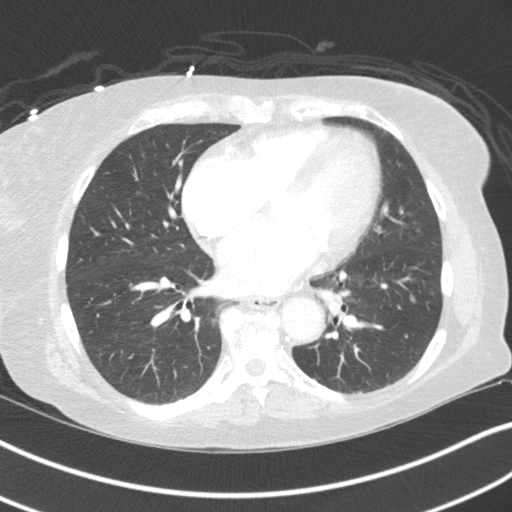
[im 88/158  lung]
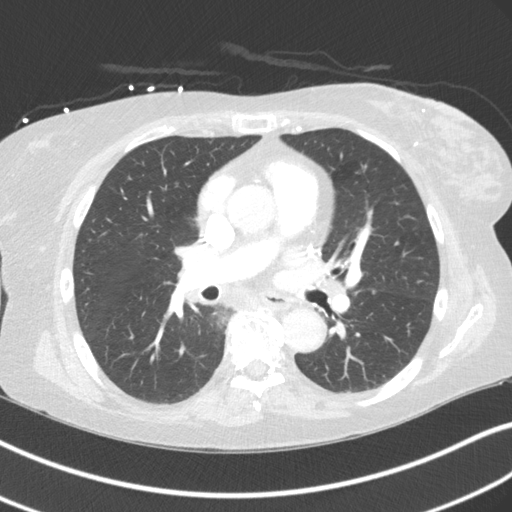
[im 99/158  lung]
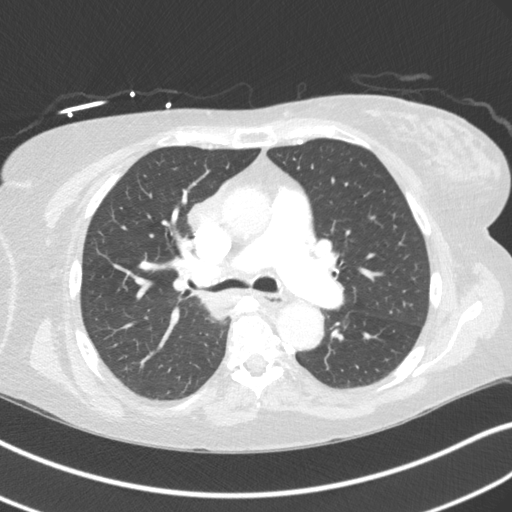
[im 111/158  mediastinal]
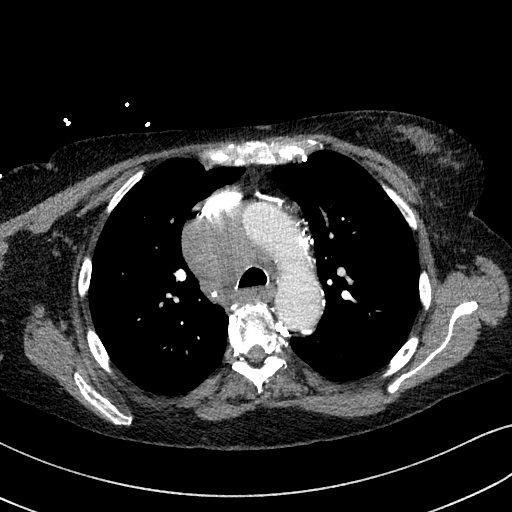
[im 111/158  lung]
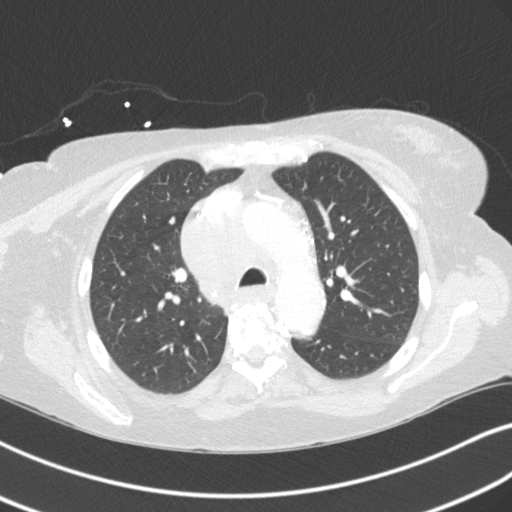
[im 123/158  lung]
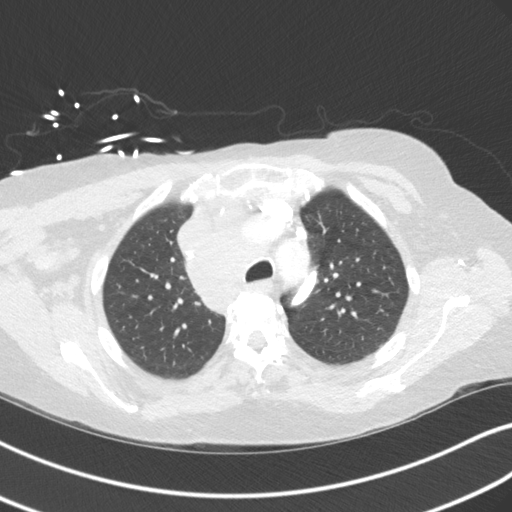
[im 134/158  lung]
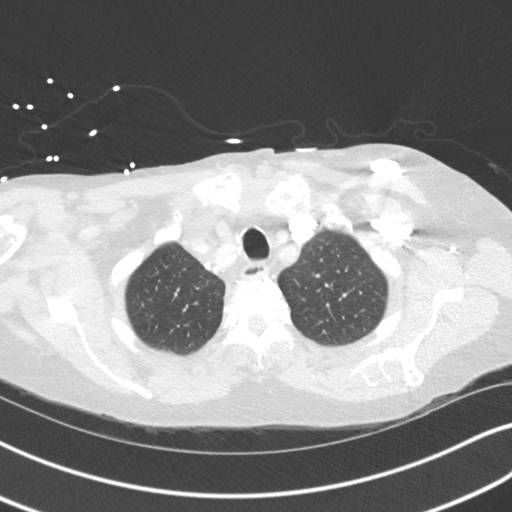
[im 146/158  lung]
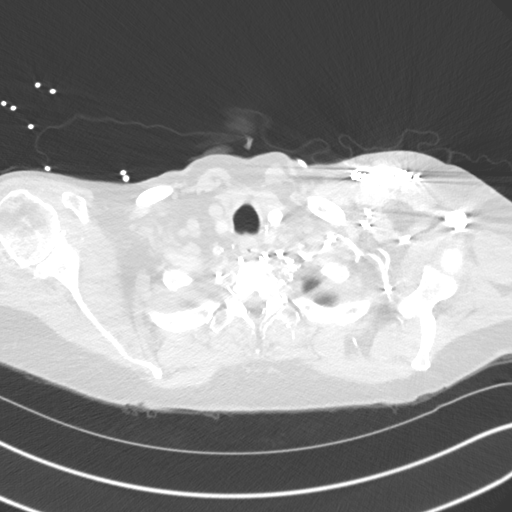

[Series 5: coronal · coronal · 0.62mm/px · 3 of 151 slices shown]
[im 31/151  lung]
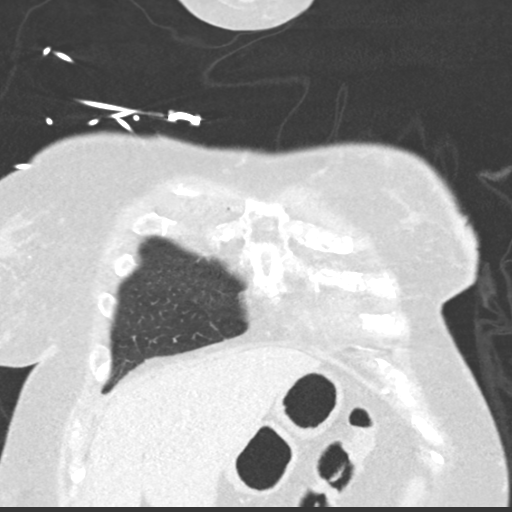
[im 61/151  lung]
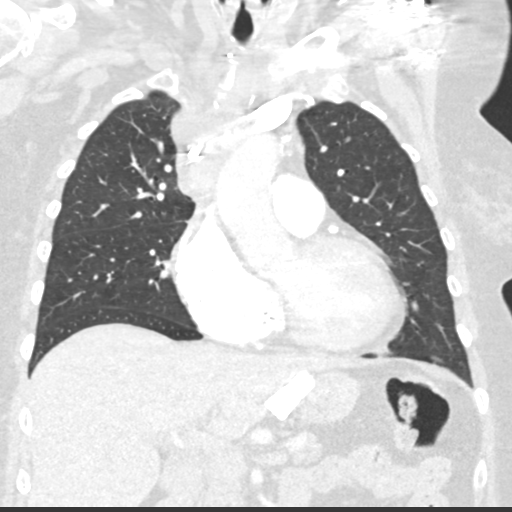
[im 91/151  lung]
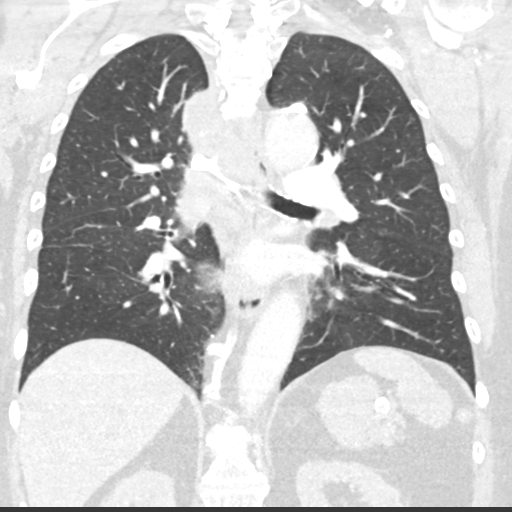

[15 of 36 positions shown; findings below may reference images not displayed]

FINDINGS: Cardiovascular: There is extensive coronary artery calcification.
Left atrial appendage clip is in place. Mitral annulus
calcification. The heart is mildly enlarged. No pericardial
effusion. Bovine arch anatomy.

There is narrowing of the superior vena cava secondary to
mediastinal mass described below. There is filling and numerous
collateral vessels. Left upper extremity contrast injection.

Mediastinum/Nodes: Superior mediastinal mass is 5.4 x 7.1 x 6.0 cm.
Mass results in significant narrowing of the superior vena cava and
has a long interface with the ascending aorta, trachea, superior
vena cava, and right brachiocephalic artery. Mass is contiguous with
the right hilar structures and encases the trachea and right
mainstem bronchus. Small mediastinal lymph nodes are identified in
the subcarinal region (1.6 cm) left hilar region (1.0 cm).

Lungs/Pleura: Multiple small pulmonary nodules are present. In the
right upper lobe, a 6 mm nodule is identified on image 39 of series
7. In the right upper lobe a 4 mm nodule is identified on image 50
of series 7. In the left lower lobe, a 5 mm nodule is identified on
image 90 of series 7. There are no pleural effusions or focal
consolidations.

Upper Abdomen: Multiple calcified granulomata are identified within
the spleen. Patient has a lap band in place around the proximal
stomach. A small low-attenuation lesion is identified in the left
hepatic lobe measuring 9 mm on image 117 of series 3, stable in
appearance and consistent with cyst. Small calcified hepatic
granulomata are present. The adrenal glands are normal in
appearance. Bilateral intrarenal calculi.

Musculoskeletal: Degenerative changes are seen in the thoracic
spine. No suspicious lytic or blastic lesions are identified.
IMPRESSION: 1. Large superior mediastinal mass contiguous with the right hilar
region resulting in significant narrowing of the superior vena cava
and collateral vessels filling. Findings are suspicious for
malignancy, either primary secondary.
2. Small pulmonary nodules as described, largest measuring 6 mm in
the right upper lobe.
3. Coronary artery disease. Mitral annulus calcification.
Cardiomegaly.
4. Bovine arch anatomy.
5. Prior granulomatous disease. Numerous calcified granulomata
within the liver and spleen.
6. Hepatic cyst.

## 2018-09-06 IMAGING — CR DG CHEST 2V
2 series · 2 of 2 positions shown · non-contrast
Comparison: 06/30/2015 chest x-ray

CLINICAL DATA: 70-year-old female with increased shortness breath
over the past 5-6 days. Thyroid cancer. Breast cancer. Initial
encounter.

EXAM:
CHEST  2 VIEW

[chest pa]
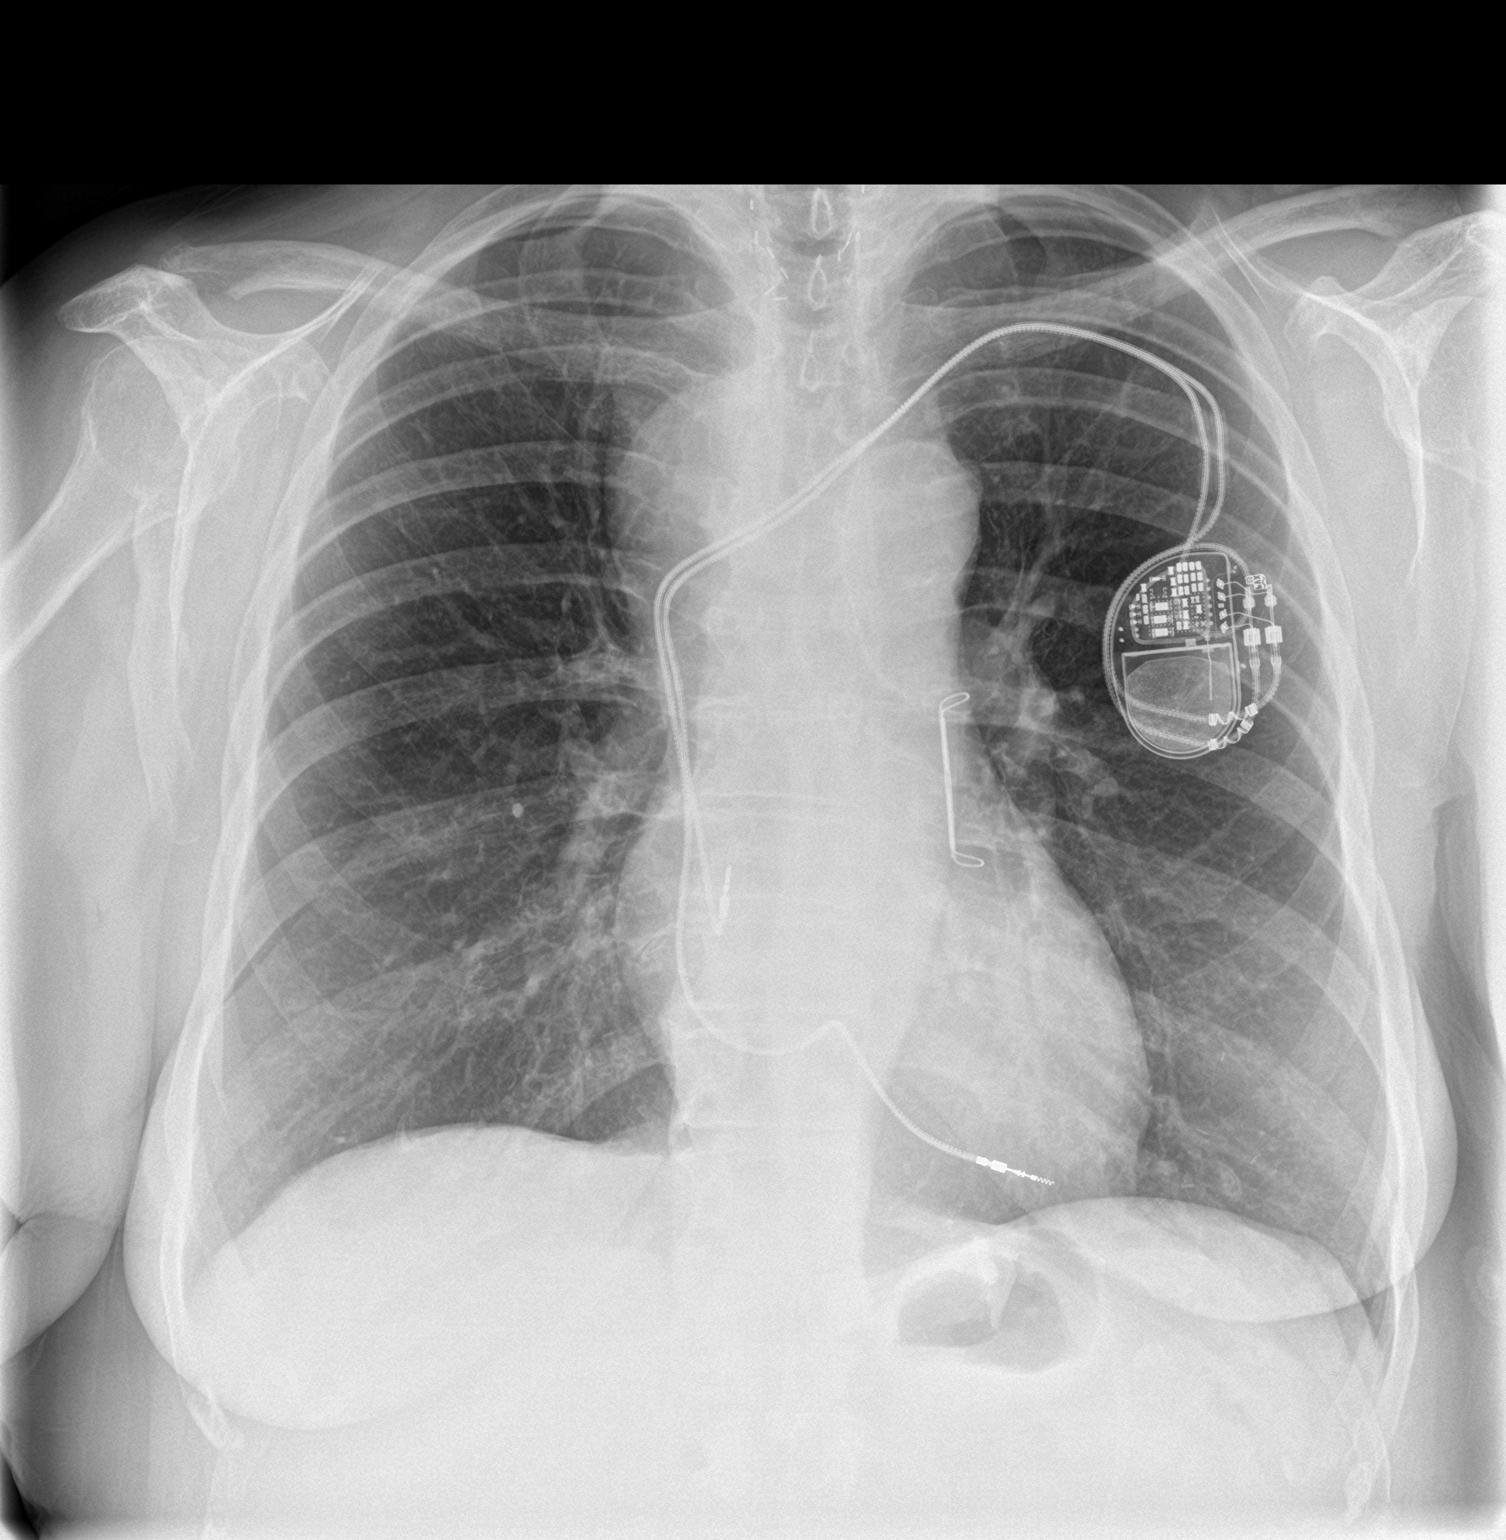

[chest lat]
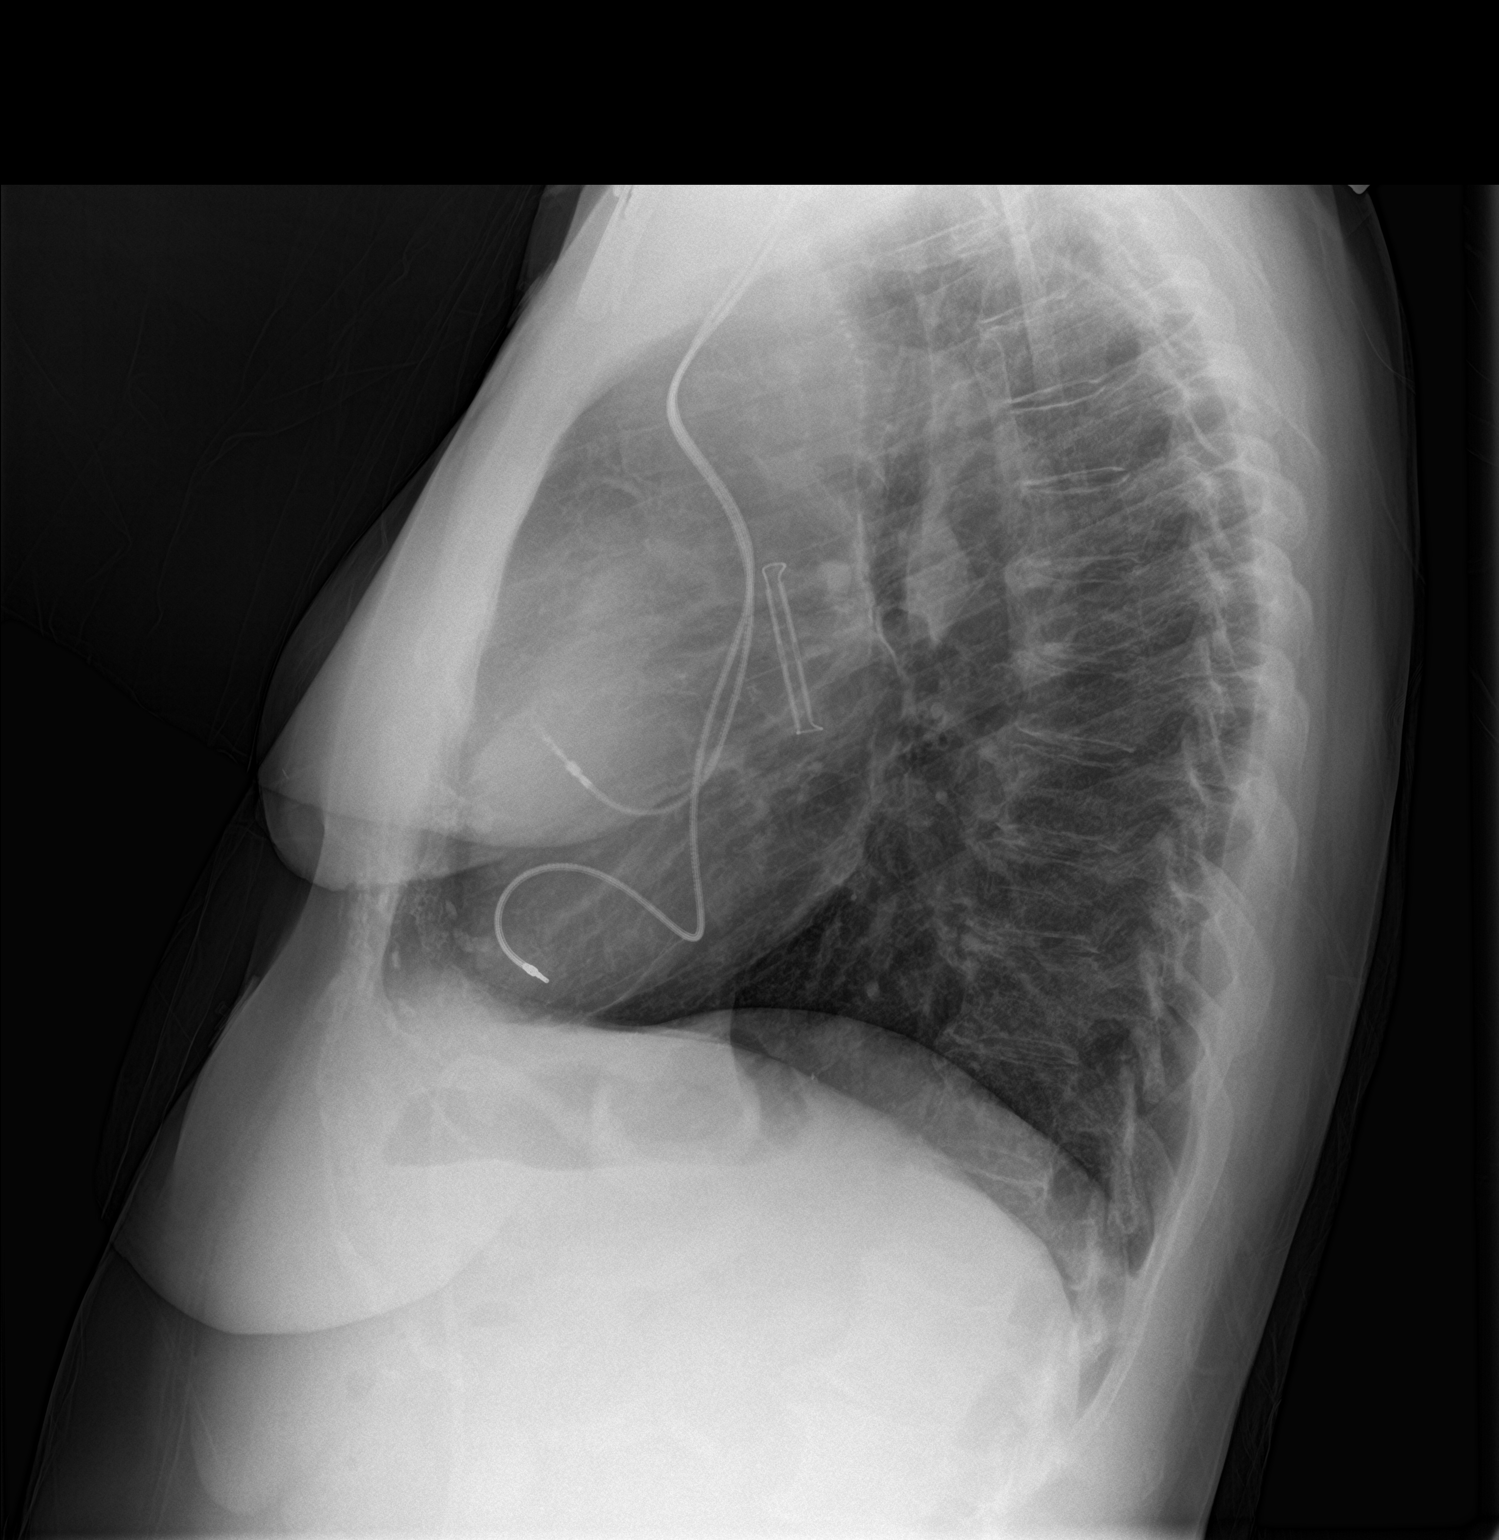

[2 of 2 positions shown; findings below may reference images not displayed]

FINDINGS: Abnormality right paratracheal region suspicious for mass. Vascular
abnormality secondary consideration. Chest CT recommended for
further evaluation.

Post thyroidectomy.

Prior clipping left atrium. Sequential pacemaker in place. Heart
size within normal limits.

No infiltrate, congestive heart failure or pneumothorax.

Erosion distal right clavicle possibly degenerative in origin.

Calcified aorta.
IMPRESSION: Abnormality right paratracheal region suspicious for mass. Vascular
abnormality secondary consideration. Chest CT recommended for
further evaluation.

Post thyroidectomy and breast surgery.

Prior clipping left atrium. Sequential pacemaker in place. Heart
size within normal limits.

No infiltrate or congestive heart failure.

Erosion distal right clavicle possibly degenerative in origin.

These results were called by telephone at the time of interpretation
on 02/03/2017 at [DATE] to Dr. TURKQANLI PULYA , who verbally
acknowledged these results.

## 2018-09-06 NOTE — Telephone Encounter (Signed)
I really don't know, sorry I can;t answer that. Thanks, Dr Jaynee Eagles

## 2018-09-08 ENCOUNTER — Encounter: Payer: Self-pay | Admitting: Rehabilitation

## 2018-09-08 ENCOUNTER — Ambulatory Visit: Payer: Medicare Other | Admitting: Rehabilitation

## 2018-09-08 DIAGNOSIS — R2689 Other abnormalities of gait and mobility: Secondary | ICD-10-CM | POA: Diagnosis not present

## 2018-09-08 DIAGNOSIS — M6281 Muscle weakness (generalized): Secondary | ICD-10-CM | POA: Diagnosis not present

## 2018-09-08 DIAGNOSIS — R293 Abnormal posture: Secondary | ICD-10-CM

## 2018-09-08 DIAGNOSIS — R296 Repeated falls: Secondary | ICD-10-CM | POA: Diagnosis not present

## 2018-09-08 DIAGNOSIS — R2681 Unsteadiness on feet: Secondary | ICD-10-CM | POA: Diagnosis not present

## 2018-09-08 IMAGING — CT CT HEAD W/O CM
3 series · 16 of 47 positions shown, 19 images · non-contrast
Comparison: 04/09/2016

CLINICAL DATA: Headache for 1 month.

EXAM:
CT HEAD WITHOUT CONTRAST
TECHNIQUE: Contiguous axial images were obtained from the base of the skull
through the vertex without intravenous contrast.

[Series 2: head 5.0 h31s · axial · 0.42mm/px · z∈[-134,-9]mm · 10 of 30 slices shown, 13 images]
[im 3/30  brain]
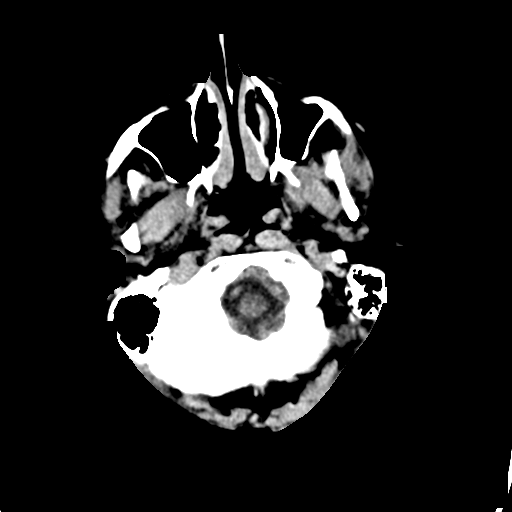
[im 3/30  bone]
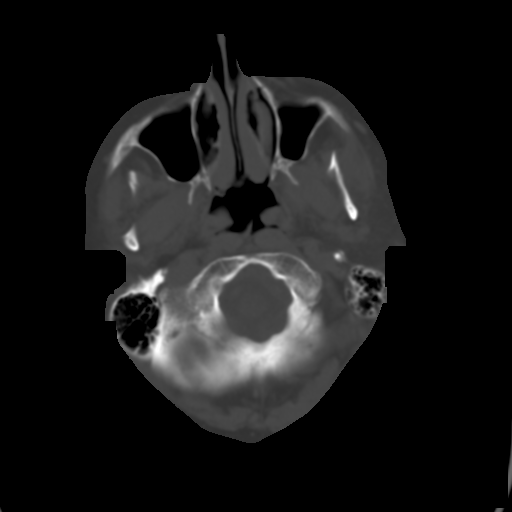
[im 6/30  brain]
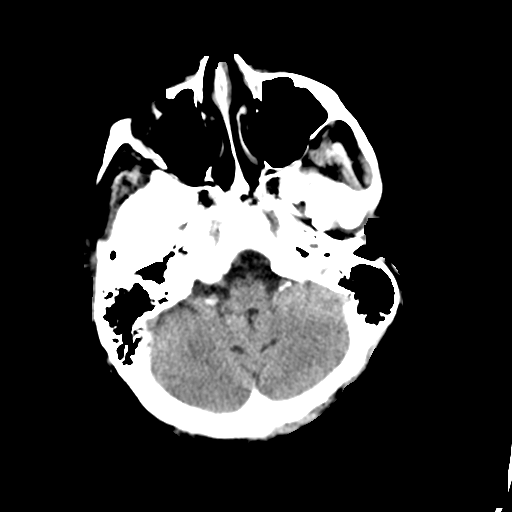
[im 9/30  brain]
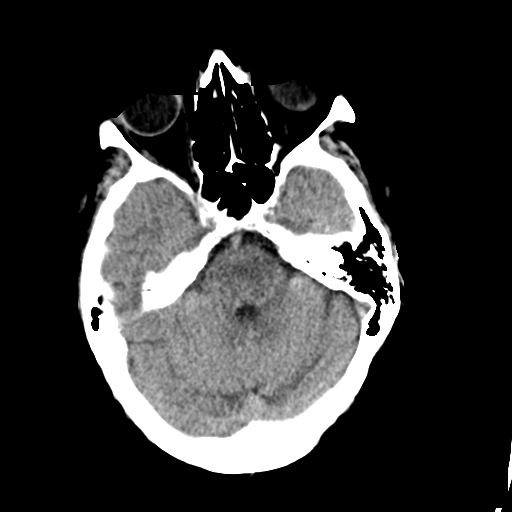
[im 11/30  brain]
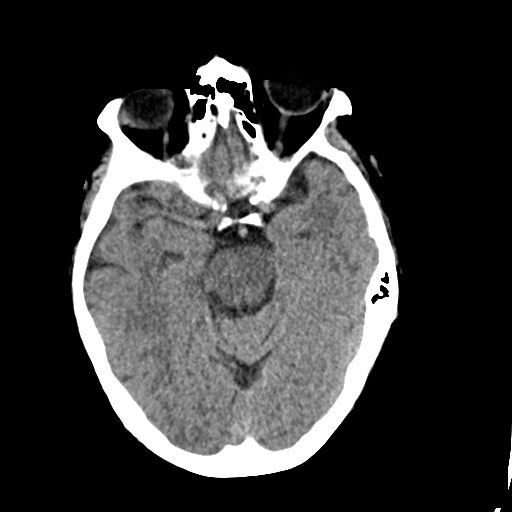
[im 14/30  brain]
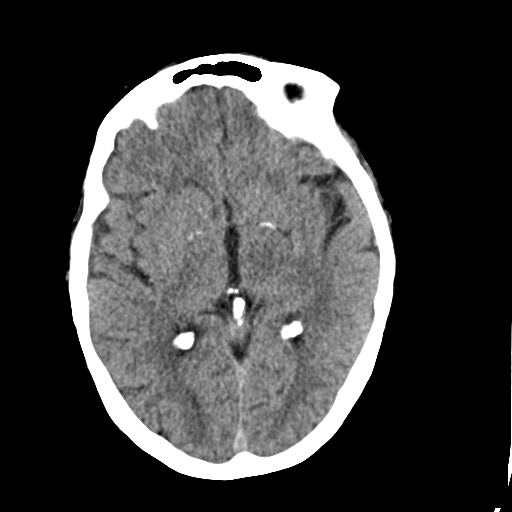
[im 14/30  bone]
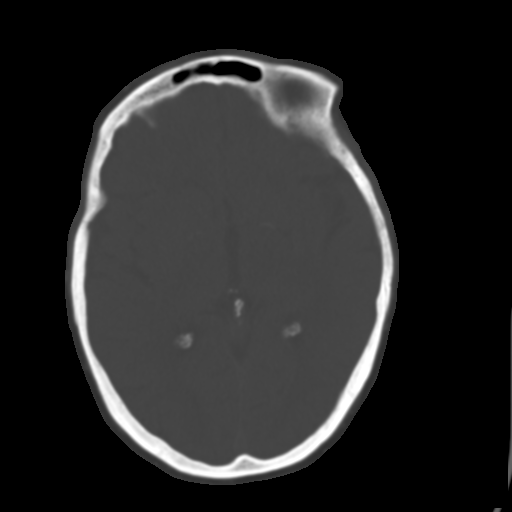
[im 17/30  brain]
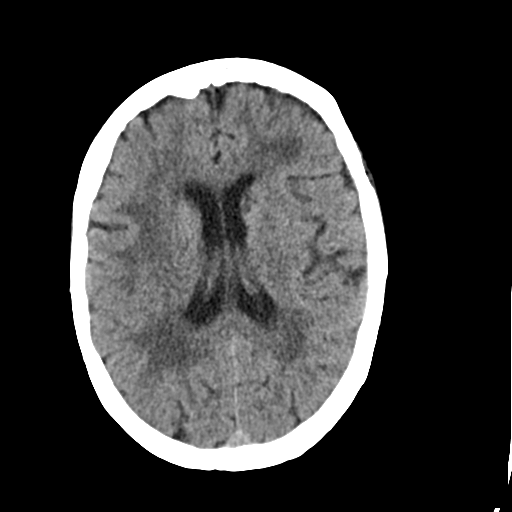
[im 20/30  brain]
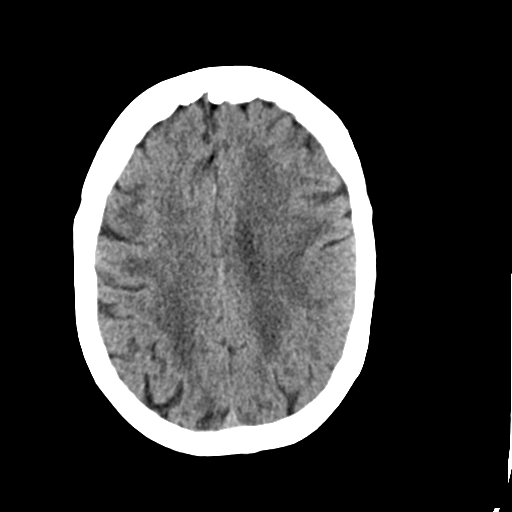
[im 23/30  brain]
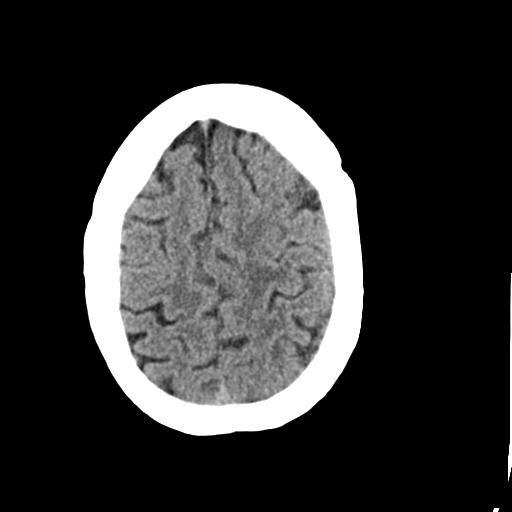
[im 25/30  brain]
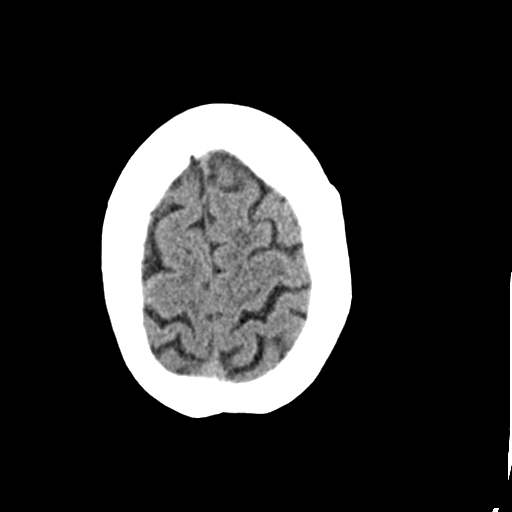
[im 25/30  bone]
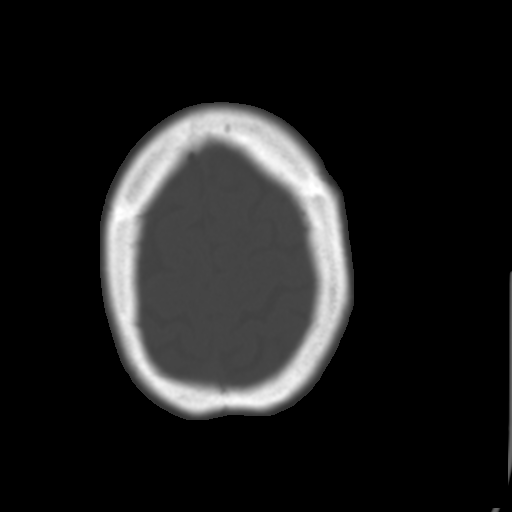
[im 28/30  brain]
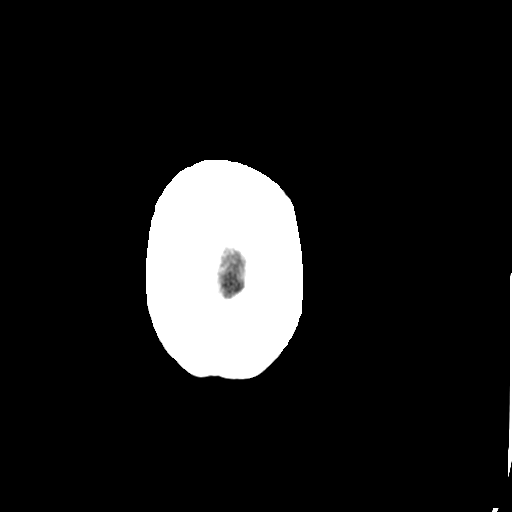

[Series 4: head 3.0 mpr cor · coronal · 0.31mm/px · 3 of 60 slices shown]
[im 20/60  brain]
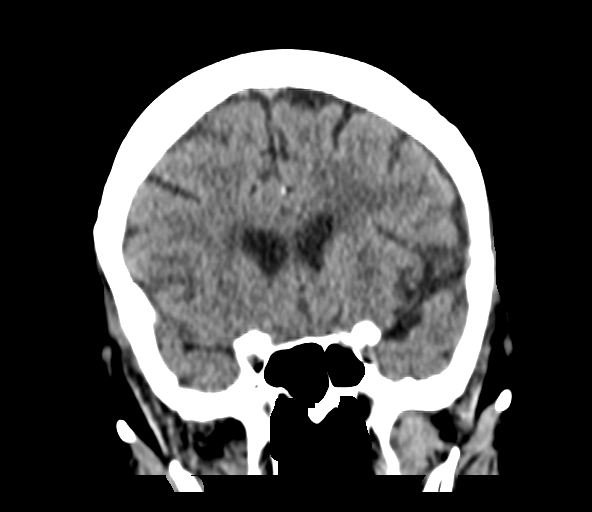
[im 27/60  brain]
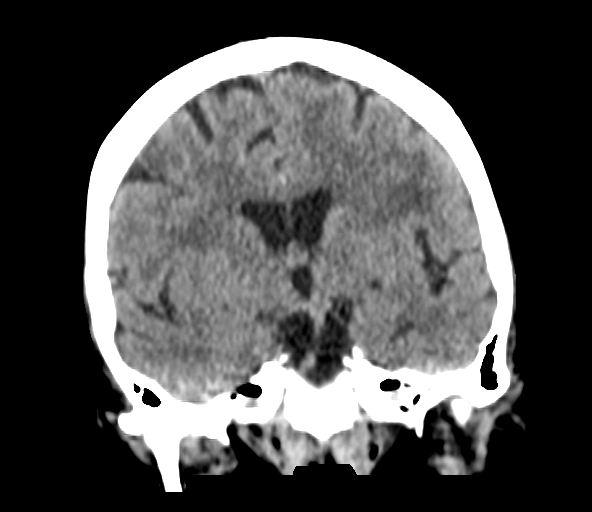
[im 33/60  brain]
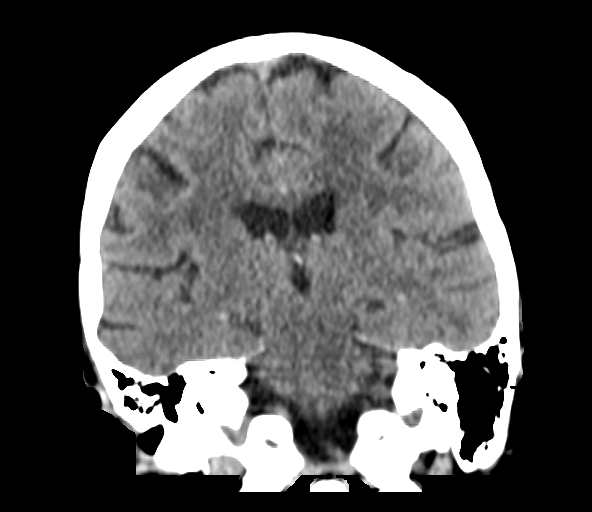

[Series 5: head 3.0 mpr sag · sagittal · 0.32mm/px · 3 of 46 slices shown]
[im 16/46  brain]
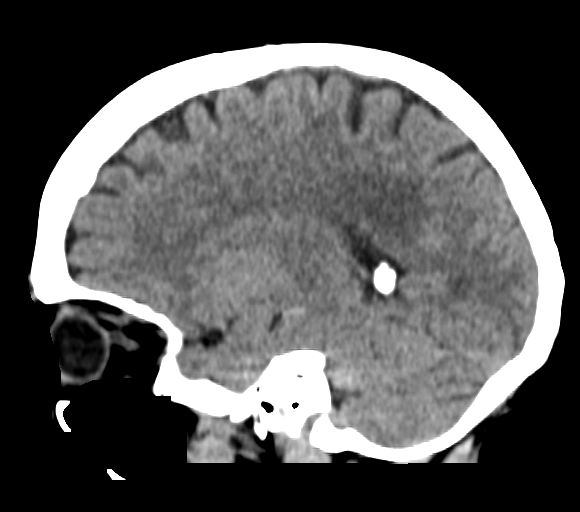
[im 23/46  brain]
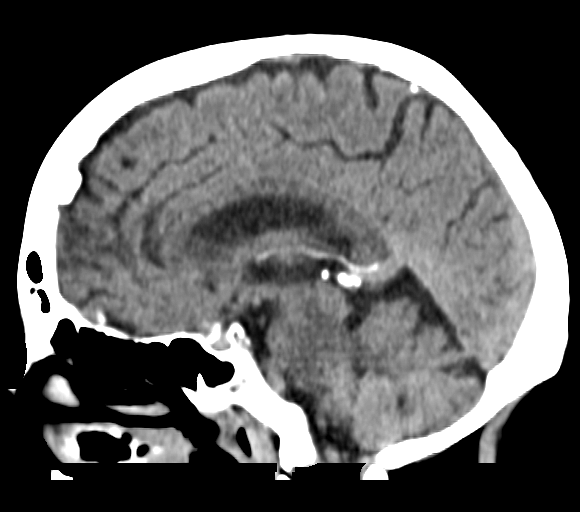
[im 31/46  brain]
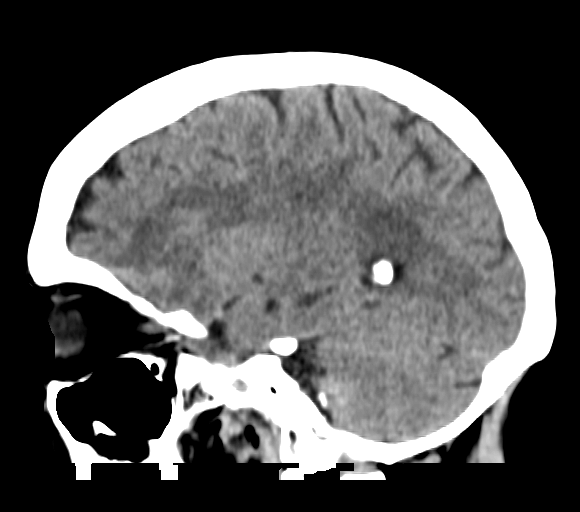

[16 of 47 positions shown; findings below may reference images not displayed]

FINDINGS: Brain: No evidence of acute infarction, hemorrhage, hydrocephalus,
extra-axial collection or mass lesion/mass effect.

Chronic small-vessel white matter ischemic changes again noted.

Vascular: Intracranial atherosclerotic calcifications again
identified.

Skull: Normal. Negative for fracture or focal lesion.

Sinuses/Orbits: No acute finding.

Other: None.
IMPRESSION: No evidence of acute intracranial abnormality.

Chronic small-vessel white matter ischemic changes.

## 2018-09-08 NOTE — Patient Instructions (Signed)
Access Code: [email or web address]  URL: [email or web address]  Date: 09/08/2018  Prepared by: Cameron Sprang   Exercises  Sit to Stand with Arms Crossed - 10 reps - 1 sets - 2x daily - 7x weekly  Supine Straight Leg Raises - 10 reps - 3 sets - 1x daily - 7x weekly  Clamshell - 10 reps - 1 sets - 2x daily - 7x weekly  Sidelying Hip Extension in Abduction - 10 reps - 1 sets - 2x daily - 7x weekly  Supine Figure 4 Piriformis Stretch - 3 reps - 1 sets - 15-20 hold - 1x daily - 7x weekly  Supine ITB Stretch with Strap - 3 reps - 1 sets - 15-20 hold - 1-2x daily - 7x weekly

## 2018-09-09 NOTE — Therapy (Signed)
[facility] [address] [place], [place], [address] Phone: [phone]   Fax:  [phone]  Physical Therapy Treatment  Patient Details  Name: Kayla Harrison MRN: [id number] Date of Birth: 1946/09/17 Referring Provider (PT): Sarina Ill, MD   Encounter Date: 09/08/2018  PT End of Session - 09/09/18 0844    Visit Number  4    Number of Visits  17    Date for PT Re-Evaluation  11/12/18   only plan to see for 60 days, note one week delay in start 2/2 holiday)   Authorization Type  Medicare and [employer]-needs 10th visit progress note    PT Start Time  1104    PT Stop Time  1150    PT Time Calculation (min)  46 min    Activity Tolerance  Patient tolerated treatment well    Behavior During Therapy  [facility] for tasks assessed/performed       Past Medical History:  Diagnosis Date  . Anemia   . Arthritis    osteoarthritis - knees and right shoulder  . Atrial fibrillation [facility])    ablation- 2x's-- 1st time- [facility], 2nd event at [facility] in 2008. Convergent ablation at [facility] 6/14  . Atrial fibrillation ([place])   . Blood transfusion without reported diagnosis   . Breast cancer ([place])    Dr Margot Chimes, total thyroidectomy- 1999- for cancer  . 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 Chronic bilateral pleural effusions   . Colon polyp    Dr Earlean Shawl  . Complete heart block ([place])   . Complication of anesthesia    Ketamine produces LSD reaction, bright colored nightmarish experience   . Dyslipidemia   . Dyspnea   . Endometriosis   . Fibroids   . H/O pleural effusion    s/p thoracentesis w 3243m withdrawn  . Hepatitis    [place] ?hepatitis   . History of dysphagia    due to radiation therapy  . History of hiatal hernia    small noted on PET scan  . History of kidney stones   . Hx of thyroid cancer    Dr SForde Dandy . Hyperlipidemia   . Hypertension   . Hypothyroidism   . Lung cancer, lower lobe  ([place] 01[place]   radiation RX completed 03/04/17; will start chemo 6/27, pt unaware of lung cancer  . Morbid obesity ([place]    Status post lap band surgery  . Nephrolithiasis   . Non Hodgkin's lymphoma ([place]    on chemotherapy  . Persistent atrial fibrillation    a. s/p PVI 2008 b. s/p convergent ablation 26962complicated by bradycardia requiring pacemaker implant  . Presence of permanent cardiac pacemaker   . Rotator cuff tear    Right  . Sinus node dysfunction ([place]    Complicating convergent ablation 6/14  . St[place]    [place]  . SVC syndrome    with lung mass and non hodgkins lymphoma  . Thyroid cancer ([place] 2000    Past Surgical History:  Procedure Laterality Date  . ABDOMINAL HYSTERECTOMY  1983  . afib ablation     a. 2008 PVI b. 2014 convergent ablation  . APPENDECTOMY    . BONE MARROW BIOPSY  02/21/2017  . BREAST LUMPECTOMY Left 2010  . bso  1998  . CARDIAC CATHETERIZATION     2015- negative  . CARDIOVERSION  10/09/2012   Procedure: CARDIOVERSION;  Surgeon: JMinus Breeding MD;  Location: [facility];  Service: Cardiovascular;  Laterality: N/A;  . CARDIOVERSION  10/09/2012   Procedure: CARDIOVERSION;  Surgeon: Minus Breeding, MD;  Location: [facility] ENDOSCOPY;  Service: Cardiovascular;  Laterality: N/A;  [name] gave the ok to add pt to the add on , but we must check to find out if the can add pt on at [phone] ( [phone])  . CARDIOVERSION N/A 11/20/2012   Procedure: CARDIOVERSION;  Surgeon: Fay Records, MD;  Location: [facility] ENDOSCOPY;  Service: Cardiovascular;  Laterality: N/A;  . CARDIOVERSION N/A 07/18/2017   Procedure: CARDIOVERSION;  Surgeon: Thayer Headings, MD;  [facility] ENDOSCOPY;  Service: Cardiovascular;  Laterality: N/A;  . CARDIOVERSION N/A 10/03/2017   Procedure: CARDIOVERSION;  Surgeon: Sanda Klein, MD;  Location: [facility] ENDOSCOPY;  Service: Cardiovascular;  Laterality: N/A;  . CARDIOVERSION N/A 01/07/2018   Procedure: CARDIOVERSION;  Surgeon: Acie Fredrickson Wonda Cheng, MD;   Location: [facility];  Service: Cardiovascular;  Laterality: N/A;  . CHOLECYSTECTOMY    . COLONOSCOPY W/ POLYPECTOMY     Dr Earlean Shawl  . CYSTOSCOPY N/A 02/06/2015   Procedure: CYSTOSCOPY;  Surgeon: Kathie Rhodes, MD;  [facility];  Service: Urology;  Laterality: N/A;  . CYSTOSCOPY W/ RETROGRADES Left 11/17/2017   Procedure: CYSTOSCOPY WITH RETROGRADE /PYELOGRAM/;  Surgeon: Kathie Rhodes, MD;  Location: [facility];  Service: Urology;  Laterality: Left;  . CYSTOSCOPY WITH RETROGRADE PYELOGRAM, URETEROSCOPY AND STENT PLACEMENT Right 02/06/2015   Procedure: RETROGRADE PYELOGRAM, RIGHT URETEROSCOPY STENT PLACEMENT;  Surgeon: Kathie Rhodes, MD;  [facility];  Service: Urology;  Laterality: Right;  . CYSTOSCOPY WITH RETROGRADE PYELOGRAM, URETEROSCOPY AND STENT PLACEMENT Right 03/07/2017   Procedure: CYSTOSCOPY WITH RIGHT RETROGRADE PYELOGRAM,RIGHT  URETEROSCOPYLASER LITHOTRIPSY  AND STENT PLACEMENT AND STONE BASKETRY;  Surgeon: Kathie Rhodes, MD;  Location: [facility];  Service: Urology;  Laterality: Right;  . EYE SURGERY     cataract surgery  . fatty mass removal  1999   pubic area  . HOLMIUM LASER APPLICATION N/A 12/30/1446   Procedure: HOLMIUM LASER APPLICATION;  Surgeon: Kathie Rhodes, MD;  [facility];  Service: Urology;  Laterality: N/A;  . HOLMIUM LASER APPLICATION Right 1/85/6314   Procedure: HOLMIUM LASER APPLICATION;  Surgeon: Kathie Rhodes, MD;  Location: [facility];  Service: Urology;  Laterality: Right;  . HOLMIUM LASER APPLICATION Left 9/70/2637   Procedure: HOLMIUM LASER APPLICATION;  Surgeon: Kathie Rhodes, MD;  [facility];  Service: Urology;  Laterality: Left;  . IR FLUORO GUIDE PORT INSERTION RIGHT  02/24/2017  . IR NEPHROSTOMY PLACEMENT RIGHT  11/17/2017  . IR PATIENT EVAL TECH 0-60 MINS  03/11/2017  . IR REMOVAL TUN ACCESS W/ PORT W/O FL MOD SED  04/20/2018  . IR US GUIDE VASC ACCESS RIGHT  02/24/2017  . KNEE ARTHROSCOPY     bilateral  .  LAPAROSCOPIC GASTRIC BANDING  07/10/2010  . LAPAROSCOPIC GASTRIC BANDING     Laparoscopic adjustable banding APS System with posterior hiatal hernia, 2 suture.  Marland Kitchen LAPAROTOMY     for ruptured ovary and ovarian artery   . NEPHROLITHOTOMY Right 11/17/2017   Procedure: NEPHROLITHOTOMY PERCUTANEOUS;  Surgeon: Kathie Rhodes, MD;  [facility];  Service: Urology;  Laterality: Right;  . PACEMAKER INSERTION  03/10/2013   MDT dual chamber PPM  . POCKET REVISION N/A 12/08/2013   Procedure: POCKET REVISION;  Surgeon: Deboraha Sprang, MD;  [facility] LAB;  Service: Cardiovascular;  Laterality: N/A;  . PORTA CATH INSERTION    . REVERSE  SHOULDER ARTHROPLASTY Right 05/14/2018   Procedure: RIGHT REVERSE SHOULDER ARTHROPLASTY;  Surgeon: Tania Ade, MD;  Location: [facility];  Service: Orthopedics;  Laterality: Right;  . REVERSE SHOULDER REPLACEMENT Right 05/14/2018  . TEE WITH CARDIOVERSION  09/22/2017  . TEE WITHOUT CARDIOVERSION N/A 10/03/2017   Procedure: TRANSESOPHAGEAL ECHOCARDIOGRAM (TEE);  Surgeon: Sanda Klein, MD;  Location: [facility];  Service: Cardiovascular;  Laterality: N/A;  . THYROIDECTOMY  1998   Dr Margot Chimes  . TONSILLECTOMY    . TOTAL KNEE ARTHROPLASTY  04/13/2012   Procedure: TOTAL KNEE ARTHROPLASTY;  Surgeon: Rudean Haskell, MD;  Location: [facility];  Service: Orthopedics;  Laterality: Right;  Marland Kitchen VIDEO BRONCHOSCOPY WITH ENDOBRONCHIAL ULTRASOUND N/A 02/07/2017   Procedure: VIDEO BRONCHOSCOPY WITH ENDOBRONCHIAL ULTRASOUND;  Surgeon: Marshell Garfinkel, MD;  Location: [facility];  Service: Pulmonary;  Laterality: N/A;    There were no vitals filed for this visit.  Subjective Assessment - 09/08/18 1110    Subjective  Sore from doing exercises, but better today.     Pertinent History  lymphoma (had chemotherapy), vertigo, small vessel disease, arthritis (has had R knee and reverse total shoulder on R)    Limitations  Walking;House hold activities    Patient Stated Goals  "I want to be able  to walk in a normal fashion"     Currently in Pain?  No/denies             Vestibular Assessment - 09/08/18 1110      Symptom Behavior   Type of Dizziness  Spinning    Frequency of Dizziness  when turning L in bed    Duration of Dizziness  short    Aggravating Factors  Turning head sideways;Rolling to left    Relieving Factors  Slow movements;Closing eyes      Occulomotor Exam   Occulomotor Alignment  Normal    Spontaneous  Absent      Positional Testing   Sidelying Test  Sidelying Right;Sidelying Left    Horizontal Canal Testing  Horizontal Canal Right;Horizontal Canal Left      Sidelying Right   Sidelying Right Duration  none    Sidelying Right Symptoms  No nystagmus      Sidelying Left   Sidelying Left Duration  none    Sidelying Left Symptoms  No nystagmus      Horizontal Canal Right   Horizontal Canal Right Duration  none    Horizontal Canal Right Symptoms  Normal      Horizontal Canal Left   Horizontal Canal Left Duration  none    Horizontal Canal Left Symptoms  Normal               [facility] Adult PT Treatment/Exercise - 09/08/18 1110      Ambulation/Gait   Ambulation/Gait  Yes    Ambulation/Gait Assistance  6: Modified independent (Device/Increase time)    Ambulation/Gait Assistance Details  Had pt ambulate following stretching and IT band/TFL manual therapy with improvement in pain with gait.  Also note slight decrease in antalgic gait pattern and decreased Trendelenburg with cues.      Ambulation Distance (Feet)  115 Feet    Assistive device  Straight cane    Gait Pattern  Step-through pattern;Decreased arm swing - right;Decreased arm swing - left;Decreased stride length;Right flexed knee in stance;Left flexed knee in stance;Left foot flat;Lateral hip instability;Trendelenburg;Wide base of support;Poor foot clearance - left;Poor foot clearance - right    Ambulation Surface  Level;Indoor      Self-Care  Self-Care  Other Self-Care Comments     Other Self-Care Comments   Discussed MD response to dry needling question regarding if adipose tissue is lying on top of muscle or superimposed.  MD reporting she is unsure.  Pt reporting she feels that it is.  Feel that adipose tissue/possible lipomas are becoming adhered in many areas and that PT feels this is impacting mobility.  Unsure if areas that are adhered would benefit from mobilization to reduce adherence.  Pt verbalized understanding.        Exercises   Exercises  Other Exercises    Other Exercises   Pt reporting increased groin pain with certain exercises.  Pt felt that it was with piriformis stretch (knee to opposite shoulder), therefore modified during session.  Pt able to do turned seated on EOM, however this did not reproduce effective stretch, therefore had her lie down and cross ankle over opposite knee and press knee towards mat and this produced adequate stretch-added to HEP.  Also addressed ITB/TFL tightness during session.  Had pt attempt to perform in SL dropping top leg off EOM with assist from PT.  This caused increased pain in low back, therefore demonstrated in supine pulling leg across body with strap.  Pt able to do this well and reports has done in the past, therefore added this to HEP.  Pt also demonstrated clam exercise in session x 10 reps without pain, but did provide cues for decreased trunk rotation as she tends to kick in some trunk and hip flexor muscles during exercise.  Pt verbalized and demonstrated exercise appropriately following cues.        Manual Therapy   Manual Therapy  Myofascial release    Manual therapy comments  For decreased pain and improved flexibilty    Myofascial Release  Performed foam roller release to ITB/TFL during session x 4 mins.              PT Education - 09/09/18 0844    Education Details  see self care, updated HEP     Person(s) Educated  Patient    Methods  Explanation;Demonstration;Handout    Comprehension  Verbalized  understanding;Returned demonstration       PT Short Term Goals - 09/01/18 1540      PT SHORT TERM GOAL #1   Title  Pt will be independent with initial HEP in order to indicate decreased fall risk and improved functional mobility (Target Date: 09/20/18)    Time  5    Period  Weeks    Status  New      PT SHORT TERM GOAL #2   Title  Pt will perform DGI and improve score 3 points from baseline in order to indicate decreased fall risk.      Baseline  15/24 on 12/10    Time  5    Period  Weeks    Status  New      PT SHORT TERM GOAL #3   Title  Pt will improve gait speed to >/=2.62 ft/sec w/ LRAD in order to indicate safe community ambulator.     Time  5    Period  Weeks    Status  New      PT SHORT TERM GOAL #4   Title  Will assess need for AFO/support for LLE and request order as appropriate.      Time  5    Period  Weeks    Status  New  PT SHORT TERM GOAL #5   Title  Will assess 6MWT and improve distance by 150' in order to indicate improved functional endurance.     Baseline  859' on 12/10    Time  5    Period  Weeks    Status  New      PT SHORT TERM GOAL #6   Title  Pt will perform floor recovery at mod A level in order to indiciate improved ability to get up in case of fall.      Time  5    Period  Weeks    Status  New      PT SHORT TERM GOAL #7   Title  Pt will negotiate up/down curb step with LRAD at mod I level in order to indicate safe community negotiation.     Time  5    Period  Weeks    Status  New        PT Long Term Goals - 08/14/18 1508      PT LONG TERM GOAL #1   Title  Pt will be independent with final HEP in order to indicate decreased fall risk and improved functional mobility.  (Target Date: 10/20/18    Time  9    Period  Weeks    Status  New    Target Date  10/20/18      PT LONG TERM GOAL #2   Title  Pt will perform 5TSS in <11 secs without UE support and without using backs of legs on seated surface in order to indicate improved BLE  strength.      Time  9    Period  Weeks    Status  New      PT LONG TERM GOAL #3   Title  Pt will ambulate x 500' w/ LRAD over unlevel outdoor paved surfaces at mod I level in order to indicate safe community mobility.      Time  9    Period  Weeks    Status  New      PT LONG TERM GOAL #4   Title  Pt will improve DGI by 6 points from baseline in order to indicate decreased fall risk.     Time  9    Period  Weeks    Status  New      PT LONG TERM GOAL #5   Title  Pt will perform floor recovery at min A level in order to indicate improved ability to get up from floor in case of fall.     Time  9    Period  Weeks    Status  New      Additional Long Term Goals   Additional Long Term Goals  Yes      PT LONG TERM GOAL #6   Title  Pt will improve 6MWT by 300' from baseline in order to indicate improved functional endurance.     Time  9    Period  Weeks    Status  New            Plan - 09/09/18 0845    Clinical Impression Statement  Skilled session focused on addressing possible concern with current stretching causing pain.  Modified and added stretches as needed.  Addressed ITB/TFL tension/tightness with improvement noted following manual therapy.  Educated on use of ice and stretching to reduce pain and inflammation.  Also assessed for horizontal canal BPPV today with inability to produce symptoms/nystagmus.  Also briefly assessed orthostatics which were also normal.      Rehab Potential  Good    Clinical Impairments Affecting Rehab Potential  neuropathy, uncertain of reason for adipose tissue in BLEs, chronic small vessel disease    PT Frequency  2x / week    PT Duration  8 weeks   note that she will miss next week 2/2 holiday, so POC for 9 weeks   PT Treatment/Interventions  ADLs/Self Care Home Management;Aquatic Therapy;DME Instruction;Gait training;Stair training;Functional mobility training;Therapeutic activities;Therapeutic exercise;Balance training;Neuromuscular  re-education;Patient/family education;Orthotic Fit/Training;Scar mobilization;Passive range of motion;Dry needling;Vestibular    PT Next Visit Plan  UE strength, balance, LE strengthening (esp hip), endurance.     PT Home Exercise Plan  [email or web address]    Consulted and Agree with Plan of Care  Patient       Patient will benefit from skilled therapeutic intervention in order to improve the following deficits and impairments:  Decreased activity tolerance, Decreased balance, Decreased coordination, Decreased endurance, Decreased knowledge of use of DME, Decreased mobility, Decreased range of motion, Decreased strength, Difficulty walking, Dizziness, Hypomobility, Impaired flexibility, Impaired perceived functional ability, Impaired sensation, Impaired UE functional use, Postural dysfunction  Visit Diagnosis: Unsteadiness on feet  Muscle weakness (generalized)  Repeated falls  Abnormal posture  Other abnormalities of gait and mobility     Problem List Patient Active Problem List   Diagnosis Date Noted  . Gait abnormality 07/10/2018  . S/P reverse total shoulder arthroplasty, 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  . Persistent atrial fibrillation   . Bilateral ureteral calculi 11/17/2017  . Atrial fibrillation with RVR ([place]) 09/15/2017  . Atrial flutter ([place]) 07/17/2017  . Port catheter in place 04/02/2017  . Non-Hodgkin lymphoma, unspecified, intrathoracic lymph nodes ([place]) 02/13/2017  . Mediastinal mass 02/06/2017  . 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 Abnormal chest x-ray   . Lung mass 02/03/2017  . 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  . OSA (obstructive sleep apnea) 02/03/2015  . History of renal calculi 11/16/2014  . Renal calculi 11/16/2014  . Family history of colon cancer 10/26/2014  . Mechanical complication due to cardiac pacemaker pulse generator 11/06/2013  . Dyspnea 05/12/2013  . Hypothyroidism 04/21/2013  . Pleural effusion 04/19/2013  . Long term (current) use of  anticoagulants 12/20/2010  . EPIDERMOID CYST 08/22/2010  . Chronic diastolic heart failure ([place]) 08/06/2010  . SYNCOPE AND COLLAPSE 07/27/2010  . OSTEOARTHRITIS, KNEE, RIGHT 03/15/2010  . Morbid obesity ([place]) 12/07/2009  . NONSPEC ELEVATION OF LEVELS OF TRANSAMINASE/LDH 09/08/2009  . BREAST CANCER, HX OF 07/25/2009  . COLONIC POLYPS, HX OF 07/25/2009  . TUBULOVILLOUS ADENOMA, COLON 04/29/2008  . HYPERGLYCEMIA, FASTING 04/29/2008  . HYPERLIPIDEMIA 06/22/2007  . CARCINOMA, THYROID GLAND, HX OF 06/22/2007    Cameron Sprang, PT, MPT [facility] [address] [place], [place], [address] Phone: [phone]   Fax:  [phone] 09/09/18, 8:50 AM  Name: Kayla Harrison MRN: [id number] Date of Birth: 19-Jun-1946

## 2018-09-10 ENCOUNTER — Ambulatory Visit: Payer: Medicare Other | Admitting: Physical Therapy

## 2018-09-10 DIAGNOSIS — M6281 Muscle weakness (generalized): Secondary | ICD-10-CM

## 2018-09-10 DIAGNOSIS — R296 Repeated falls: Secondary | ICD-10-CM | POA: Diagnosis not present

## 2018-09-10 DIAGNOSIS — R2689 Other abnormalities of gait and mobility: Secondary | ICD-10-CM

## 2018-09-10 DIAGNOSIS — R2681 Unsteadiness on feet: Secondary | ICD-10-CM | POA: Diagnosis not present

## 2018-09-10 DIAGNOSIS — R293 Abnormal posture: Secondary | ICD-10-CM | POA: Diagnosis not present

## 2018-09-10 IMAGING — CR DG CHEST 2V
3 series · 3 of 3 positions shown · non-contrast
Comparison: 02/03/2017 CT, chest radiograph and prior exams

CLINICAL DATA: Status post bronchoscopy with biopsy.

EXAM:
CHEST  2 VIEW

[w chest lat]
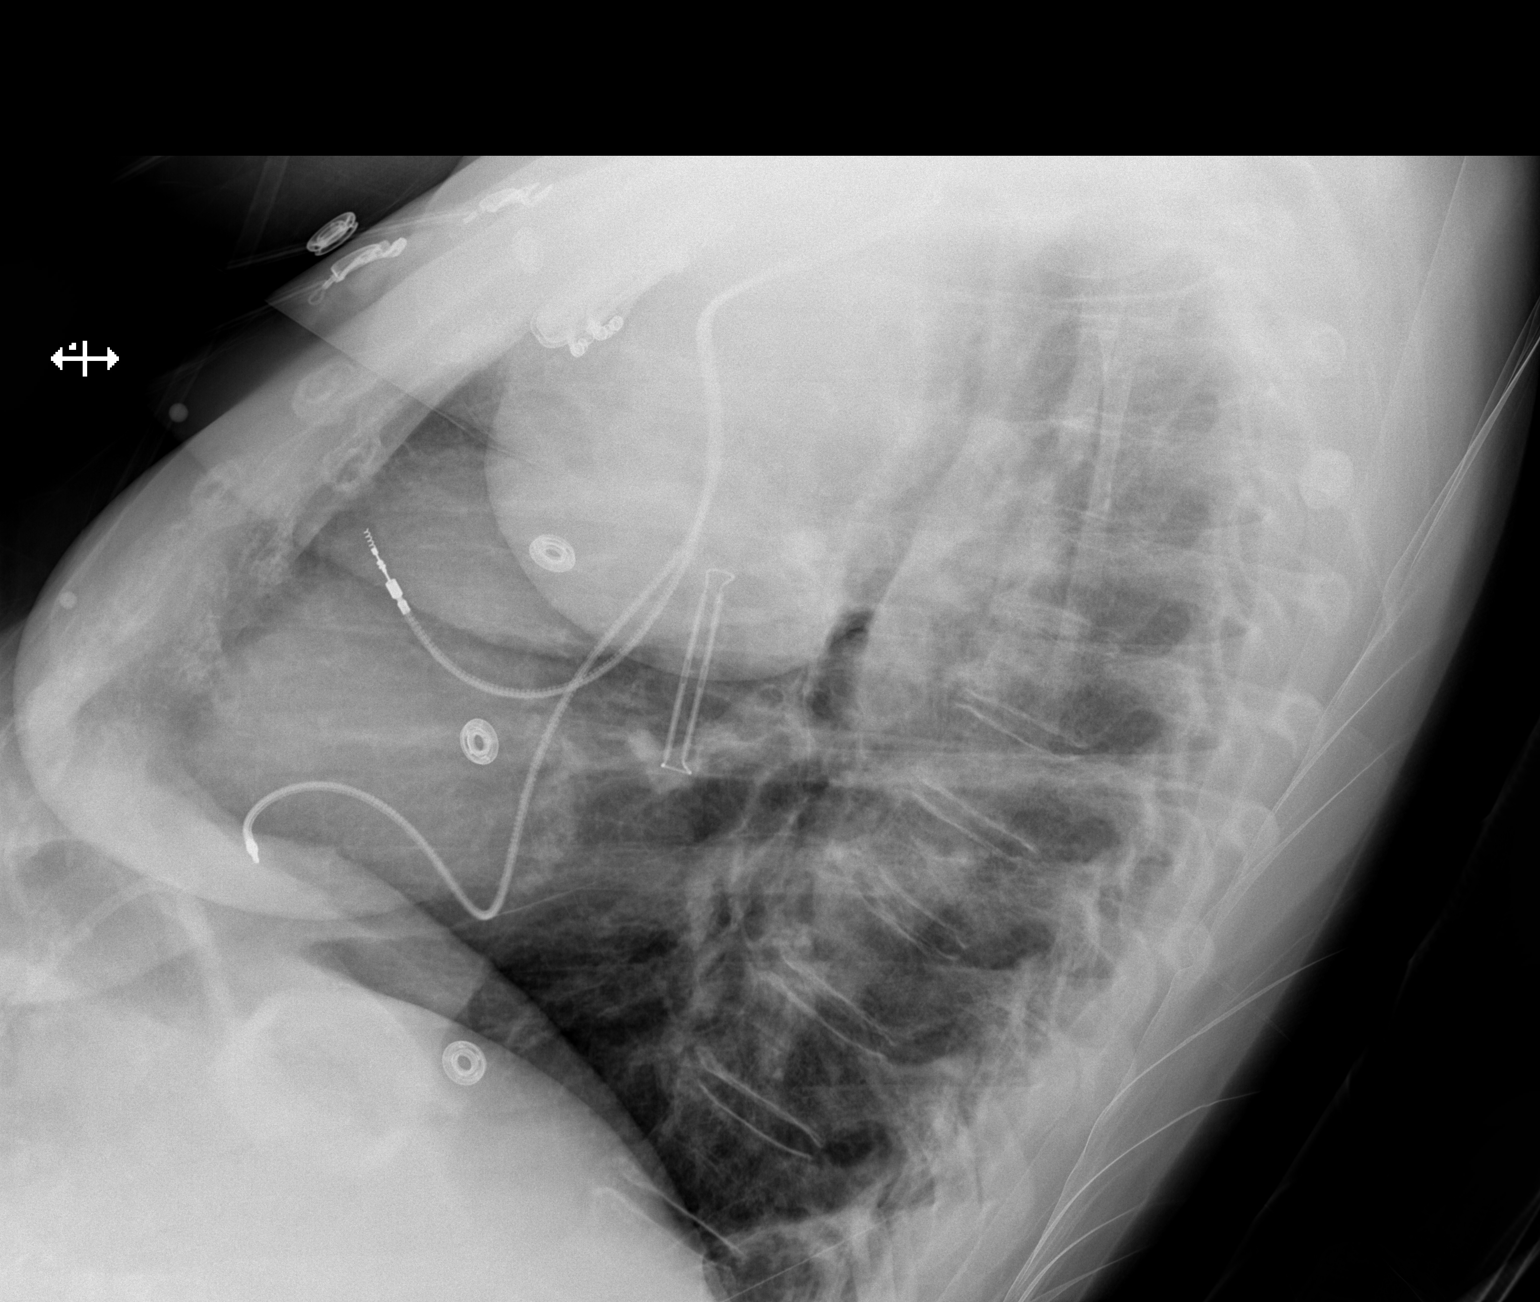

[x chest ap (1 of 2)]
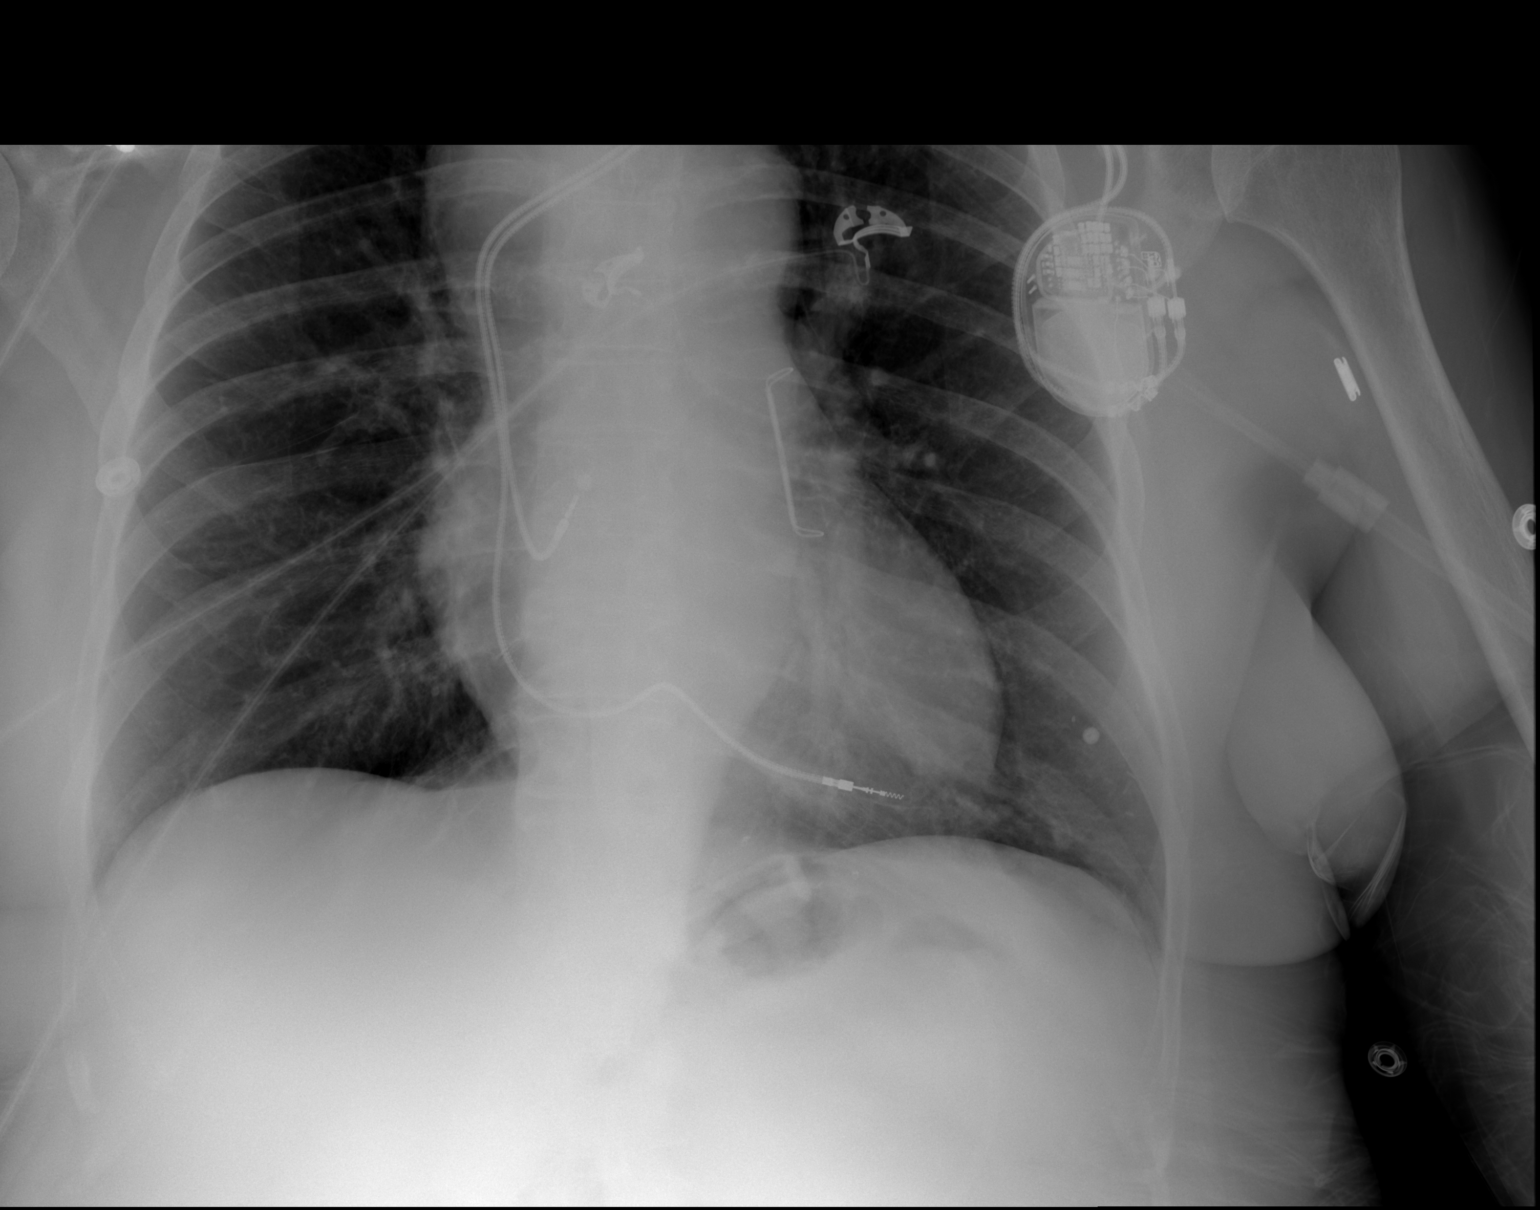

[x chest ap (2 of 2)]
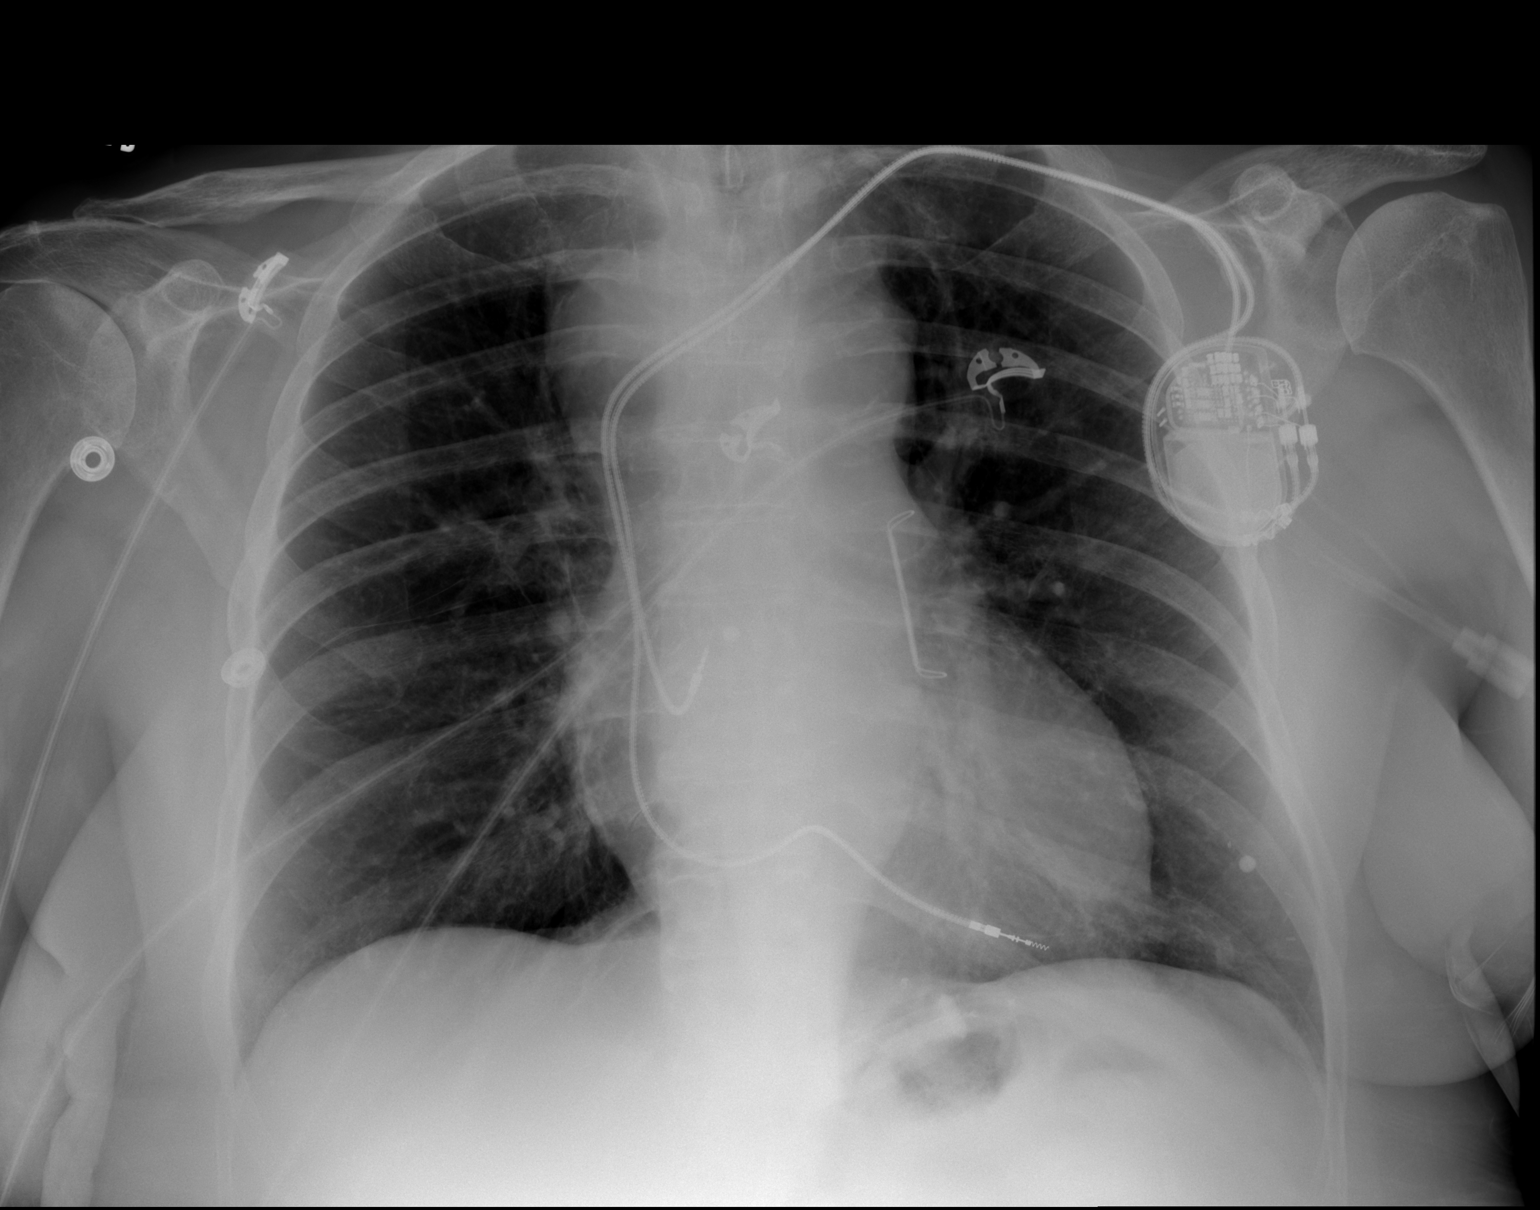

[3 of 3 positions shown; findings below may reference images not displayed]

FINDINGS: Cardiomegaly, left atrial clip and left-sided pacemaker again noted.

A superior right mediastinal mass is again identified.

There is no evidence of pneumothorax or pleural effusion.

No airspace disease identified.
IMPRESSION: No evidence of acute abnormality. No evidence of pneumothorax or
pleural effusion.

Superior right mediastinal mass again identified.

## 2018-09-11 LAB — CUP PACEART REMOTE DEVICE CHECK
Battery Remaining Longevity: 57 mo
Brady Statistic AP VS Percent: 91.04 %
Brady Statistic AS VP Percent: 0.06 %
Brady Statistic AS VS Percent: 3.48 %
Brady Statistic RV Percent Paced: 5.29 %
Date Time Interrogation Session: 20191104160258
Implantable Lead Implant Date: 20140618
Implantable Lead Location: 753859
Lead Channel Impedance Value: 361 Ohm
Lead Channel Pacing Threshold Amplitude: 0.625 V
Lead Channel Pacing Threshold Pulse Width: 0.4 ms
Lead Channel Pacing Threshold Pulse Width: 0.4 ms
Lead Channel Sensing Intrinsic Amplitude: 1.625 mV
Lead Channel Sensing Intrinsic Amplitude: 1.625 mV
Lead Channel Sensing Intrinsic Amplitude: 10.375 mV
Lead Channel Sensing Intrinsic Amplitude: 10.375 mV
Lead Channel Setting Pacing Amplitude: 2.5 V
Lead Channel Setting Pacing Pulse Width: 0.4 ms
MDC IDC LEAD IMPLANT DT: 20140618
MDC IDC LEAD LOCATION: 753860
MDC IDC MSMT BATTERY VOLTAGE: 2.99 V
MDC IDC MSMT LEADCHNL RA IMPEDANCE VALUE: 475 Ohm
MDC IDC MSMT LEADCHNL RA PACING THRESHOLD AMPLITUDE: 0.875 V
MDC IDC MSMT LEADCHNL RV IMPEDANCE VALUE: 931 Ohm
MDC IDC MSMT LEADCHNL RV IMPEDANCE VALUE: 988 Ohm
MDC IDC PG IMPLANT DT: 20140618
MDC IDC SET LEADCHNL RA PACING AMPLITUDE: 2 V
MDC IDC SET LEADCHNL RV SENSING SENSITIVITY: 0.9 mV
MDC IDC STAT BRADY AP VP PERCENT: 5.42 %
MDC IDC STAT BRADY RA PERCENT PACED: 96.08 %

## 2018-09-11 NOTE — Therapy (Signed)
[facility] [address] [place], [place], [address] Phone: [phone]   Fax:  [phone]  Physical Therapy Treatment  Patient Details  Name: Kayla Harrison MRN: [id number] Date of Birth: [birth year] Referring Provider (PT): Sarina Ill, MD   Encounter Date: 09/10/2018  PT End of Session - 09/11/18 1335    Visit Number  5    Number of Visits  17    Date for PT Re-Evaluation  11/12/18    Authorization Type  Medicare and [employer]-needs 10th visit progress note    PT Start Time  2774    PT Stop Time  1533    PT Time Calculation (min)  46 min       Past Medical History:  Diagnosis Date  . Anemia   . Arthritis    osteoarthritis - knees and right shoulder  . Atrial fibrillation [facility])    ablation- 2x's-- 1st time- [facility], 2nd event at [facility] in 2008. Convergent ablation at [facility] 6/14  . Atrial fibrillation ([place])   . Blood transfusion without reported diagnosis   . Breast cancer ([place])    Dr Margot Chimes, total thyroidectomy- 1999- for cancer  . 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 Chronic bilateral pleural effusions   . Colon polyp    Dr Earlean Shawl  . Complete heart block ([place])   . Complication of anesthesia    Ketamine produces LSD reaction, bright colored nightmarish experience   . Dyslipidemia   . Dyspnea   . Endometriosis   . Fibroids   . H/O pleural effusion    s/p thoracentesis w 3254m withdrawn  . Hepatitis    [place]hepatitis   . History of dysphagia    due to radiation therapy  . History of hiatal hernia    small noted on PET scan  . History of kidney stones   . Hx of thyroid cancer    Dr SForde Dandy . Hyperlipidemia   . Hypertension   . Hypothyroidism   . Lung cancer, lower lobe ([place] 01/2017   radiation RX completed 03/04/17; will start chemo 6/27, pt unaware of lung cancer  . Morbid obesity ([place]    Status post lap band surgery  . Nephrolithiasis   . Non Hodgkin's  lymphoma ([place]    on chemotherapy  . Persistent atrial fibrillation    a. s/p PVI 2008 b. s/p convergent ablation [id number] by bradycardia requiring pacemaker implant  . Presence of permanent cardiac pacemaker   . Rotator cuff tear    Right  . Sinus node dysfunction ([place]    Complicating convergent ablation 6/14  . [place] ([facility]    [place]  . SVC syndrome    with lung mass and non hodgkins lymphoma  . Thyroid cancer ([place] 2000    Past Surgical History:  Procedure Laterality Date  . ABDOMINAL HYSTERECTOMY  1983  . afib ablation     a. 2008 PVI b. 2014 convergent ablation  . APPENDECTOMY    . BONE MARROW BIOPSY  02/21/2017  . BREAST LUMPECTOMY Left 2010  . bso  1998  . CARDIAC CATHETERIZATION     2015- negative  . CARDIOVERSION  10/09/2012   Procedure: CARDIOVERSION;  Surgeon: JMinus Breeding MD;  [place]  Service: Cardiovascular;  Laterality: N/A;  . CARDIOVERSION  10/09/2012   Procedure: CARDIOVERSION;  Surgeon: JMinus Breeding MD;  [place] [facility]  Service: Cardiovascular;  Laterality: N/A;  [name]  the ok to add pt to the add on , but we must check to find out if the can add pt on at 1400 ( [phone])  . CARDIOVERSION N/A 11/20/2012   Procedure: CARDIOVERSION;  Surgeon: Fay Records, MD;  Location: [facility] ENDOSCOPY;  Service: Cardiovascular;  Laterality: N/A;  . CARDIOVERSION N/A 07/18/2017   Procedure: CARDIOVERSION;  Surgeon: Thayer Headings, MD;  Location: [facility] ENDOSCOPY;  Service: Cardiovascular;  Laterality: N/A;  . CARDIOVERSION N/A 10/03/2017   Procedure: CARDIOVERSION;  Surgeon: Sanda Klein, MD;  Location: [facility] ENDOSCOPY;  Service: Cardiovascular;  Laterality: N/A;  . CARDIOVERSION N/A 01/07/2018   Procedure: CARDIOVERSION;  Surgeon: Acie Fredrickson Wonda Cheng, MD;  Location: [facility];  Service: Cardiovascular;  Laterality: N/A;  . CHOLECYSTECTOMY    . COLONOSCOPY W/ POLYPECTOMY     Dr Earlean Shawl  . CYSTOSCOPY N/A 02/06/2015   Procedure: CYSTOSCOPY;   Surgeon: Kathie Rhodes, MD;  [facility]: [facility];  Service: Urology;  Laterality: N/A;  . CYSTOSCOPY W/ RETROGRADES Left 11/17/2017   Procedure: CYSTOSCOPY WITH RETROGRADE /PYELOGRAM/;  Surgeon: Kathie Rhodes, MD;  Location: [facility];  Service: Urology;  Laterality: Left;  . CYSTOSCOPY WITH RETROGRADE PYELOGRAM, URETEROSCOPY AND STENT PLACEMENT Right 02/06/2015   Procedure: RETROGRADE PYELOGRAM, RIGHT URETEROSCOPY STENT PLACEMENT;  Surgeon: Kathie Rhodes, MD;  Location: [facility];  Service: Urology;  Laterality: Right;  . CYSTOSCOPY WITH RETROGRADE PYELOGRAM, URETEROSCOPY AND STENT PLACEMENT Right 03/07/2017   Procedure: CYSTOSCOPY WITH RIGHT RETROGRADE PYELOGRAM,RIGHT  URETEROSCOPYLASER LITHOTRIPSY  AND STENT PLACEMENT AND STONE BASKETRY;  Surgeon: Kathie Rhodes, MD;  Location: [facility];  Service: Urology;  Laterality: Right;  . EYE SURGERY     cataract surgery  . fatty mass removal  1999   pubic area  . HOLMIUM LASER APPLICATION N/A 5/92/9244   Procedure: HOLMIUM LASER APPLICATION;  Surgeon: Kathie Rhodes, MD;  [facility];  Service: Urology;  Laterality: N/A;  . HOLMIUM LASER APPLICATION Right 03/20/6380   Procedure: HOLMIUM LASER APPLICATION;  Surgeon: Kathie Rhodes, MD;  Location: [facility];  Service: Urology;  Laterality: Right;  . HOLMIUM LASER APPLICATION Left 7/71/1657   Procedure: HOLMIUM LASER APPLICATION;  Surgeon: Kathie Rhodes, MD;  [facility];  Service: Urology;  Laterality: Left;  . IR FLUORO GUIDE PORT INSERTION RIGHT  02/24/2017  . IR NEPHROSTOMY PLACEMENT RIGHT  11/17/2017  . IR PATIENT EVAL TECH 0-60 MINS  03/11/2017  . IR REMOVAL TUN ACCESS W/ PORT W/O FL MOD SED  04/20/2018  . IR US GUIDE VASC ACCESS RIGHT  02/24/2017  . KNEE ARTHROSCOPY     bilateral  . LAPAROSCOPIC GASTRIC BANDING  07/10/2010  . LAPAROSCOPIC GASTRIC BANDING     Laparoscopic adjustable banding APS System with posterior hiatal hernia, 2 suture.  Marland Kitchen LAPAROTOMY     for ruptured  ovary and ovarian artery   . NEPHROLITHOTOMY Right 11/17/2017   Procedure: NEPHROLITHOTOMY PERCUTANEOUS;  Surgeon: Kathie Rhodes, MD;  [facility];  Service: Urology;  Laterality: Right;  . PACEMAKER INSERTION  03/10/2013   MDT dual chamber PPM  . POCKET REVISION N/A 12/08/2013   Procedure: POCKET REVISION;  Surgeon: Deboraha Sprang, MD;  [facility] C[facility] LAB;  Service: Cardiovascular;  Laterality: N/A;  . PORTA CATH INSERTION    . REVERSE SHOULDER ARTHROPLASTY Right 05/14/2018   Procedure: RIGHT REVERSE SHOULDER ARTHROPLASTY;  Surgeon: Tania Ade, MD;  Location: [facility];  Service: Orthopedics;  Laterality: Right;  . REVERSE SHOULDER REPLACEMENT Right 05/14/2018  . TEE  WITH CARDIOVERSION  09/22/2017  . TEE WITHOUT CARDIOVERSION N/A 10/03/2017   Procedure: TRANSESOPHAGEAL ECHOCARDIOGRAM (TEE);  Surgeon: Sanda Klein, MD;  Location: [facility];  Service: Cardiovascular;  Laterality: N/A;  . THYROIDECTOMY  1998   Dr Margot Chimes  . TONSILLECTOMY    . TOTAL KNEE ARTHROPLASTY  04/13/2012   Procedure: TOTAL KNEE ARTHROPLASTY;  Surgeon: Rudean Haskell, MD;  Location: [place];  Service: Orthopedics;  Laterality: Right;  Marland Kitchen VIDEO BRONCHOSCOPY WITH ENDOBRONCHIAL ULTRASOUND N/A 02/07/2017   Procedure: VIDEO BRONCHOSCOPY WITH ENDOBRONCHIAL ULTRASOUND;  Surgeon: Marshell Garfinkel, MD;  Location: [facility];  Service: Pulmonary;  Laterality: N/A;    There were no vitals filed for this visit.  Subjective Assessment - 09/11/18 1328    Subjective  Pt states she had vertigo this afternoon around 1:00 that lasted approx. 10 secs - "was one of the more severe ones"; occurred with sitting up on edge of bed; pt states she has not had any vertigo since then     Pertinent History  lymphoma (had chemotherapy), vertigo, small vessel disease, arthritis (has had R knee and reverse total shoulder on R)    Limitations  Walking;House hold activities    Patient Stated Goals  "I want to be able to walk in a normal fashion"      Currently in Pain?  No/denies         NeuroRe-ed:  Positional testing at beginning of session to assess for BPPV due to pt reporting severe episode of vertigo  of short duration (approx. 10 secs) earlier in pm prior to PT session: Rt and Lt horizontal roll test (-) with no nystagmus and no c/o vertigo Rt and Lt sidelying tests (-) with no nystagmus and no c/o vertigo; pt did c/o mild light-headedness with return to upright position     No signs of BPPV observed with any positional testing             [facility] Adult PT Treatment/Exercise - 09/11/18 0001      Ambulation/Gait   Ambulation/Gait  Yes    Ambulation/Gait Assistance  5: Supervision    Ambulation/Gait Assistance Details  pt amb. in clinic gym without [facility] for short distances - to steps, then to // bars and then to mat for seated rest period    Ambulation Distance (Feet)  100 Feet   total distance   Assistive device  None    Gait Pattern  Step-through pattern;Decreased arm swing - right;Decreased arm swing - left;Decreased stride length;Right flexed knee in stance;Left flexed knee in stance;Left foot flat;Lateral hip instability;Trendelenburg;Wide base of support;Poor foot clearance - left;Poor foot clearance - right    Ambulation Surface  Level;Indoor      Knee/Hip Exercises: Standing   Heel Raises  Both;1 set;10 reps    Hip Flexion  Stengthening;Right;Left;1 set;10 reps   LLE- yellow theraband;  RLE - red band   Hip Abduction  Stengthening;Right;1 set;10 reps;Knee straight;Left   with red theraband; LLE - with yellow theraband   Hip Extension  Stengthening;Right;Left;1 set;10 reps;Knee straight   RLE - red band;  LLE - yellow band   Forward Step Up  Right;Left;1 set;10 reps;Hand Hold: 2;Step Height: 6"   quad weakness bil. LE's   Functional Squat  2 sets   performed inside // bars - sidestepping w/squats   Other Standing Knee Exercises  hip flexion with knee flexed in standing - LLE yellow band; RLE red band 10  reps each  PT Short Term Goals - 09/01/18 1540      PT SHORT TERM GOAL #1   Title  Pt will be independent with initial HEP in order to indicate decreased fall risk and improved functional mobility (Target Date: 09/20/18)    Time  5    Period  Weeks    Status  New      PT SHORT TERM GOAL #2   Title  Pt will perform DGI and improve score 3 points from baseline in order to indicate decreased fall risk.      Baseline  15/24 on 12/10    Time  5    Period  Weeks    Status  New      PT SHORT TERM GOAL #3   Title  Pt will improve gait speed to >/=2.62 ft/sec w/ LRAD in order to indicate safe community ambulator.     Time  5    Period  Weeks    Status  New      PT SHORT TERM GOAL #4   Title  Will assess need for AFO/support for LLE and request order as appropriate.      Time  5    Period  Weeks    Status  New      PT SHORT TERM GOAL #5   Title  Will assess 6MWT and improve distance by 150' in order to indicate improved functional endurance.     Baseline  859' on 12/10    Time  5    Period  Weeks    Status  New      PT SHORT TERM GOAL #6   Title  Pt will perform floor recovery at mod A level in order to indiciate improved ability to get up in case of fall.      Time  5    Period  Weeks    Status  New      PT SHORT TERM GOAL #7   Title  Pt will negotiate up/down curb step with LRAD at mod I level in order to indicate safe community negotiation.     Time  5    Period  Weeks    Status  New        PT Long Term Goals - 08/14/18 1508      PT LONG TERM GOAL #1   Title  Pt will be independent with final HEP in order to indicate decreased fall risk and improved functional mobility.  (Target Date: 10/20/18    Time  9    Period  Weeks    Status  New    Target Date  10/20/18      PT LONG TERM GOAL #2   Title  Pt will perform 5TSS in <11 secs without UE support and without using backs of legs on seated surface in order to indicate improved BLE strength.       Time  9    Period  Weeks    Status  New      PT LONG TERM GOAL #3   Title  Pt will ambulate x 500' w/ LRAD over unlevel outdoor paved surfaces at mod I level in order to indicate safe community mobility.      Time  9    Period  Weeks    Status  New      PT LONG TERM GOAL #4   Title  Pt will improve DGI by 6 points from baseline in order to indicate  decreased fall risk.     Time  9    Period  Weeks    Status  New      PT LONG TERM GOAL #5   Title  Pt will perform floor recovery at min A level in order to indicate improved ability to get up from floor in case of fall.     Time  9    Period  Weeks    Status  New      Additional Long Term Goals   Additional Long Term Goals  Yes      PT LONG TERM GOAL #6   Title  Pt will improve 6MWT by 300' from baseline in order to indicate improved functional endurance.     Time  9    Period  Weeks    Status  New            Plan - 09/11/18 1335    Clinical Impression Statement  Pt's LLE is weaker than RLE with pt having difficulty with performing hip exercises with resistance as yellow band was sufficient resistance for LLE but too easy for RLE;  pt also c/o light-headedness with standing exercises and required frequent seated rest periods.  Pt had no nystagmus and no c/o vertigo with any positional testing - negative for horizontal and posterior canal BPPV.     Rehab Potential  Good    Clinical Impairments Affecting Rehab Potential  neuropathy, uncertain of reason for adipose tissue in BLEs, chronic small vessel disease    PT Frequency  2x / week    PT Duration  8 weeks    PT Treatment/Interventions  ADLs/Self Care Home Management;Aquatic Therapy;DME Instruction;Gait training;Stair training;Functional mobility training;Therapeutic activities;Therapeutic exercise;Balance training;Neuromuscular re-education;Patient/family education;Orthotic Fit/Training;Scar mobilization;Passive range of motion;Dry needling;Vestibular    PT Next Visit  Plan  UE strength, balance, LE strengthening (esp hip), endurance.     PT Home Exercise Plan  [id number]    Consulted and Agree with Plan of Care  Patient       Patient will benefit from skilled therapeutic intervention in order to improve the following deficits and impairments:  Decreased activity tolerance, Decreased balance, Decreased coordination, Decreased endurance, Decreased knowledge of use of DME, Decreased mobility, Decreased range of motion, Decreased strength, Difficulty walking, Dizziness, Hypomobility, Impaired flexibility, Impaired perceived functional ability, Impaired sensation, Impaired UE functional use, Postural dysfunction  Visit Diagnosis: Unsteadiness on feet  Muscle weakness (generalized)  Other abnormalities of gait and mobility     Problem List Patient Active Problem List   Diagnosis Date Noted  . Gait abnormality 07/10/2018  . S/P reverse total shoulder arthroplasty, 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  . Persistent atrial fibrillation   . Bilateral ureteral calculi 11/17/2017  . Atrial fibrillation with RVR ([place]) 09/15/2017  . Atrial flutter ([place]) 07/17/2017  . Port catheter in place 04/02/2017  . Non-Hodgkin lymphoma, unspecified, intrathoracic lymph nodes ([place]) 02/13/2017  . Mediastinal mass 02/06/2017  . 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 Abnormal chest x-ray   . Lung mass 02/03/2017  . 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  . OSA (obstructive sleep apnea) 02/03/2015  . History of renal calculi 11/16/2014  . Renal calculi 11/16/2014  . Family history of colon cancer 10/26/2014  . Mechanical complication due to cardiac pacemaker pulse generator 11/06/2013  . Dyspnea 05/12/2013  . Hypothyroidism 04/21/2013  . Pleural effusion 04/19/2013  . Long term (current) use of anticoagulants 12/20/2010  . EPIDERMOID CYST 08/22/2010  . Chronic diastolic heart failure ([place]) 08/06/2010  .  SYNCOPE AND COLLAPSE 07/27/2010  . OSTEOARTHRITIS, KNEE, RIGHT  03/15/2010  . Morbid obesity ([place]) 12/07/2009  . NONSPEC ELEVATION OF LEVELS OF TRANSAMINASE/LDH 09/08/2009  . BREAST CANCER, HX OF 07/25/2009  . COLONIC POLYPS, HX OF 07/25/2009  . TUBULOVILLOUS ADENOMA, COLON 04/29/2008  . HYPERGLYCEMIA, FASTING 04/29/2008  . HYPERLIPIDEMIA 06/22/2007  . CARCINOMA, THYROID GLAND, HX OF 06/22/2007    Nour Rodrigues, [place], PT 09/11/2018, 1:40 PM  [facility] [address] [place], [place], [address] Phone: [phone]   Fax:  [phone]  Name: Kayla Harrison MRN: [id number] Date of Birth: [birth year]

## 2018-09-17 ENCOUNTER — Encounter: Payer: Self-pay | Admitting: Rehabilitation

## 2018-09-17 ENCOUNTER — Ambulatory Visit: Payer: Medicare Other | Admitting: Rehabilitation

## 2018-09-17 DIAGNOSIS — R2681 Unsteadiness on feet: Secondary | ICD-10-CM

## 2018-09-17 DIAGNOSIS — R2689 Other abnormalities of gait and mobility: Secondary | ICD-10-CM | POA: Diagnosis not present

## 2018-09-17 DIAGNOSIS — R296 Repeated falls: Secondary | ICD-10-CM | POA: Diagnosis not present

## 2018-09-17 DIAGNOSIS — R293 Abnormal posture: Secondary | ICD-10-CM

## 2018-09-17 DIAGNOSIS — M6281 Muscle weakness (generalized): Secondary | ICD-10-CM | POA: Diagnosis not present

## 2018-09-17 NOTE — Therapy (Signed)
[facility] [address] [place], [place], [address] Phone: [phone]   Fax:  [phone]  Physical Therapy Treatment  Patient Details  Name: Kayla Harrison MRN: [id number] Date of Birth: 1945-12-26 Referring Provider (PT): Sarina Ill, MD   Encounter Date: 09/17/2018  PT End of Session - 09/17/18 1028    Visit Number  6    Number of Visits  17    Date for PT Re-Evaluation  11/12/18    Authorization Type  Medicare and [employer]-needs 10th visit progress note    PT Start Time  1022   PT late from previous session   PT Stop Time  1100    PT Time Calculation (min)  38 min    Activity Tolerance  Patient tolerated treatment well    Behavior During Therapy  [facility] for tasks assessed/performed       Past Medical History:  Diagnosis Date  . Anemia   . Arthritis    osteoarthritis - knees and right shoulder  . Atrial fibrillation [facility])    ablation- 2x's-- 1st time- [facility], 2nd event at [facility] in 2008. Convergent ablation at [facility] 6/14  . Atrial fibrillation ([place])   . Blood transfusion without reported diagnosis   . Breast cancer ([place])    Dr Margot Chimes, total thyroidectomy- 1999- for cancer  . 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 Chronic bilateral pleural effusions   . Colon polyp    Dr Earlean Shawl  . Complete heart block ([place])   . Complication of anesthesia    Ketamine produces LSD reaction, bright colored nightmarish experience   . Dyslipidemia   . Dyspnea   . Endometriosis   . Fibroids   . H/O pleural effusion    s/p thoracentesis w 3259m withdrawn  . Hepatitis    [place] ?hepatitis   . History of dysphagia    due to radiation therapy  . History of hiatal hernia    small noted on PET scan  . History of kidney stones   . Hx of thyroid cancer    Dr SForde Dandy . Hyperlipidemia   . Hypertension   . Hypothyroidism   . Lung cancer, [place] lobe [place]   radiation RX completed  03/04/17; will start chemo 6/27, pt unaware of lung cancer  . Morbid [place] ([place]    Status post lap band surgery  . Nephrolithiasis   . Non Hodgkin's lymphoma ([place]    on chemotherapy  . Persistent atrial fibrillation    a. s/p PVI 2008 b. s/p convergent ablation 23559complicated by bradycardia requiring pacemaker implant  . Presence of permanent cardiac pacemaker   . Rotator cuff tear    Right  . Sinus node dysfunction ([place]    Complicating convergent ablation 6/14  . [place]    [place]  . SVC syndrome    with lung mass and non hodgkins lymphoma  . Thyroid cancer [place]    Past Surgical History:  Procedure Laterality Date  . ABDOMINAL HYSTERECTOMY  1983  . afib ablation     a. 2008 PVI b. 2014 convergent ablation  . APPENDECTOMY    . BONE MARROW BIOPSY  02/21/2017  . BREAST LUMPECTOMY Left 2010  . bso  1998  . CARDIAC CATHETERIZATION     2015- negative  . CARDIOVERSION  10/09/2012   Procedure: CARDIOVERSION;  Surgeon: JMinus Breeding MD;  [facility]  Service: Cardiovascular;  Laterality: N/A;  .  CARDIOVERSION  10/09/2012   Procedure: CARDIOVERSION;  Surgeon: Minus Breeding, MD;  Location: [facility] ENDOSCOPY;  Service: Cardiovascular;  Laterality: N/A;  [name] gave the ok to add pt to the add on , but we must check to find out if the can add pt on at [phone] ( [phone])  . CARDIOVERSION N/A 11/20/2012   Procedure: CARDIOVERSION;  Surgeon: Fay Records, MD;  Location: [facility] ENDOSCOPY;  Service: Cardiovascular;  Laterality: N/A;  . CARDIOVERSION N/A 07/18/2017   Procedure: CARDIOVERSION;  Surgeon: Thayer Headings, MD;  Location: [facility] ENDOSCOPY;  Service: Cardiovascular;  Laterality: N/A;  . CARDIOVERSION N/A 10/03/2017   Procedure: CARDIOVERSION;  Surgeon: Sanda Klein, MD;  Location: [facility] ENDOSCOPY;  Service: Cardiovascular;  Laterality: N/A;  . CARDIOVERSION N/A 01/07/2018   Procedure: CARDIOVERSION;  Surgeon: Acie Fredrickson Wonda Cheng, MD;  Location: [facility];  Service:  Cardiovascular;  Laterality: N/A;  . CHOLECYSTECTOMY    . COLONOSCOPY W/ POLYPECTOMY     Dr Earlean Shawl  . CYSTOSCOPY N/A 02/06/2015   Procedure: CYSTOSCOPY;  Surgeon: Kathie Rhodes, MD;  [facility]: [facility];  Service: Urology;  Laterality: N/A;  . CYSTOSCOPY W/ RETROGRADES Left 11/17/2017   Procedure: CYSTOSCOPY WITH RETROGRADE /PYELOGRAM/;  Surgeon: Kathie Rhodes, MD;  Location: [facility];  Service: Urology;  Laterality: Left;  . CYSTOSCOPY WITH RETROGRADE PYELOGRAM, URETEROSCOPY AND STENT PLACEMENT Right 02/06/2015   Procedure: RETROGRADE PYELOGRAM, RIGHT URETEROSCOPY STENT PLACEMENT;  Surgeon: Kathie Rhodes, MD;  [facility]: [facility];  Service: Urology;  Laterality: Right;  . CYSTOSCOPY WITH RETROGRADE PYELOGRAM, URETEROSCOPY AND STENT PLACEMENT Right 03/07/2017   Procedure: CYSTOSCOPY WITH RIGHT RETROGRADE PYELOGRAM,RIGHT  URETEROSCOPYLASER LITHOTRIPSY  AND STENT PLACEMENT AND STONE BASKETRY;  Surgeon: Kathie Rhodes, MD;  Location: [facility];  Service: Urology;  Laterality: Right;  . EYE SURGERY     cataract surgery  . fatty mass removal  1999   pubic area  . HOLMIUM LASER APPLICATION N/A 01/15/9562   Procedure: HOLMIUM LASER APPLICATION;  Surgeon: Kathie Rhodes, MD;  [facility]: [facility];  Service: Urology;  Laterality: N/A;  . HOLMIUM LASER APPLICATION Right 8/75/6433   Procedure: HOLMIUM LASER APPLICATION;  Surgeon: Kathie Rhodes, MD;  Location: [facility];  Service: Urology;  Laterality: Right;  . HOLMIUM LASER APPLICATION Left 2/95/1884   Procedure: HOLMIUM LASER APPLICATION;  Surgeon: Kathie Rhodes, MD;  [facility]: [facility];  Service: Urology;  Laterality: Left;  . IR FLUORO GUIDE PORT INSERTION RIGHT  02/24/2017  . IR NEPHROSTOMY PLACEMENT RIGHT  11/17/2017  . IR PATIENT EVAL TECH 0-60 MINS  03/11/2017  . IR REMOVAL TUN ACCESS W/ PORT W/O FL MOD SED  04/20/2018  . IR US GUIDE VASC ACCESS RIGHT  02/24/2017  . KNEE ARTHROSCOPY     bilateral  . LAPAROSCOPIC GASTRIC BANDING   07/10/2010  . LAPAROSCOPIC GASTRIC BANDING     Laparoscopic adjustable banding APS System with posterior hiatal hernia, 2 suture.  Marland Kitchen LAPAROTOMY     for ruptured ovary and ovarian artery   . NEPHROLITHOTOMY Right 11/17/2017   Procedure: NEPHROLITHOTOMY PERCUTANEOUS;  Surgeon: Kathie Rhodes, MD;  [facility];  Service: Urology;  Laterality: Right;  . PACEMAKER INSERTION  03/10/2013   MDT dual chamber PPM  . POCKET REVISION N/A 12/08/2013   Procedure: POCKET REVISION;  Surgeon: Deboraha Sprang, MD;  Location: [facility] CATH LAB;  Service: Cardiovascular;  Laterality: N/A;  . PORTA CATH INSERTION    . REVERSE SHOULDER ARTHROPLASTY Right 05/14/2018   Procedure: RIGHT REVERSE SHOULDER ARTHROPLASTY;  Surgeon: Tania Ade, MD;  Location: [facility];  Service: Orthopedics;  Laterality: Right;  . REVERSE SHOULDER REPLACEMENT Right 05/14/2018  . TEE WITH CARDIOVERSION  09/22/2017  . TEE WITHOUT CARDIOVERSION N/A 10/03/2017   Procedure: TRANSESOPHAGEAL ECHOCARDIOGRAM (TEE);  Surgeon: Sanda Klein, MD;  Location: [facility];  Service: Cardiovascular;  Laterality: N/A;  . THYROIDECTOMY  1998   Dr Margot Chimes  . TONSILLECTOMY    . TOTAL KNEE ARTHROPLASTY  04/13/2012   Procedure: TOTAL KNEE ARTHROPLASTY;  Surgeon: Rudean Haskell, MD;  Location: [facility];  Service: Orthopedics;  Laterality: Right;  Marland Kitchen VIDEO BRONCHOSCOPY WITH ENDOBRONCHIAL ULTRASOUND N/A 02/07/2017   Procedure: VIDEO BRONCHOSCOPY WITH ENDOBRONCHIAL ULTRASOUND;  Surgeon: Marshell Garfinkel, MD;  Location: [facility];  Service: Pulmonary;  Laterality: N/A;    There were no vitals filed for this visit.  Subjective Assessment - 09/17/18 1026    Subjective  Pt reports had a good Christmas but was by herself.      Pertinent History  lymphoma (had chemotherapy), vertigo, small vessel disease, arthritis (has had R knee and reverse total shoulder on R)    Limitations  Walking;House hold activities    Patient Stated Goals  "I want to be able to walk in a normal  fashion"     Currently in Pain?  No/denies                       [facility] Adult PT Treatment/Exercise - 09/17/18 1030      Ambulation/Gait   Ambulation/Gait  Yes    Ambulation/Gait Assistance  5: Supervision    Ambulation/Gait Assistance Details  Gait without cane during session from task to task.  Note mild unsteadiness, esp following balance exercises, but overall did well.      Ambulation Distance (Feet)  100 Feet    Assistive device  None    Gait Pattern  Step-through pattern;Decreased arm swing - right;Decreased arm swing - left;Decreased stride length;Right flexed knee in stance;Left flexed knee in stance;Left foot flat;Lateral hip instability;Trendelenburg;Wide base of support;Poor foot clearance - left;Poor foot clearance - right    Ambulation Surface  Level;Indoor      Self-Care   Self-Care  Other Self-Care Comments    Other Self-Care Comments   Discussed getting pt into aquatic PT session at end of January to enusre appropriate/challenging HEP for pool as she goes to pool 3x/wk.  Pt very agreeable to this, therefore added to schedule.  Pt demonstrated L IT band stretch with her yoga strap during session.  Cues to hold longer and increase knee extension.  Also educated on likely vestibular hypofunction and how this relates to balance deficits.  Added to HEP to address.  Ended session with discussion about floor transfer and will try to have her perform at next session.       Therapeutic Activites    Therapeutic Activities  Other Therapeutic Activities    Other Therapeutic Activities  Assessed orthostatics during session.  BP in supine: 120/72, sitting 112/68, and standing 100/66.  Pt educated to allow time to transition from sitting to standing and not just jump out of bed quickly due to borderline orthostatic hypotension.  Pt verbalized understanding.       Neuro Re-ed    Neuro Re-ed Details   Corner balance tasks.  Standing on pillow with feet apart EO x 20 secs>EO  with head turns up/down and side/side x 10 reps each>EC x 2 sets of 20 secs.  Added last two exercises  to HEP as pt demonstrates moderate postural sway with them.               PT Education - 09/17/18 1028    Education Details  balance additions to HEP, see self care     Person(s) Educated  Patient    Methods  Explanation;Demonstration;Handout    Comprehension  Verbalized understanding;Returned demonstration       PT Short Term Goals - 09/01/18 1540      PT SHORT TERM GOAL #1   Title  Pt will be independent with initial HEP in order to indicate decreased fall risk and improved functional mobility (Target Date: 09/20/18)    Time  5    Period  Weeks    Status  New      PT SHORT TERM GOAL #2   Title  Pt will perform DGI and improve score 3 points from baseline in order to indicate decreased fall risk.      Baseline  15/24 on 12/10    Time  5    Period  Weeks    Status  New      PT SHORT TERM GOAL #3   Title  Pt will improve gait speed to >/=2.62 ft/sec w/ LRAD in order to indicate safe community ambulator.     Time  5    Period  Weeks    Status  New      PT SHORT TERM GOAL #4   Title  Will assess need for AFO/support for LLE and request order as appropriate.      Time  5    Period  Weeks    Status  New      PT SHORT TERM GOAL #5   Title  Will assess 6MWT and improve distance by 150' in order to indicate improved functional endurance.     Baseline  859' on 12/10    Time  5    Period  Weeks    Status  New      PT SHORT TERM GOAL #6   Title  Pt will perform floor recovery at mod A level in order to indiciate improved ability to get up in case of fall.      Time  5    Period  Weeks    Status  New      PT SHORT TERM GOAL #7   Title  Pt will negotiate up/down curb step with LRAD at mod I level in order to indicate safe community negotiation.     Time  5    Period  Weeks    Status  New        PT Long Term Goals - 08/14/18 1508      PT LONG TERM GOAL #1    Title  Pt will be independent with final HEP in order to indicate decreased fall risk and improved functional mobility.  (Target Date: 10/20/18    Time  9    Period  Weeks    Status  New    Target Date  10/20/18      PT LONG TERM GOAL #2   Title  Pt will perform 5TSS in <11 secs without UE support and without using backs of legs on seated surface in order to indicate improved BLE strength.      Time  9    Period  Weeks    Status  New      PT LONG TERM GOAL #3   Title  Pt will ambulate x 500' w/ LRAD over unlevel outdoor paved surfaces at mod I level in order to indicate safe community mobility.      Time  9    Period  Weeks    Status  New      PT LONG TERM GOAL #4   Title  Pt will improve DGI by 6 points from baseline in order to indicate decreased fall risk.     Time  9    Period  Weeks    Status  New      PT LONG TERM GOAL #5   Title  Pt will perform floor recovery at min A level in order to indicate improved ability to get up from floor in case of fall.     Time  9    Period  Weeks    Status  New      Additional Long Term Goals   Additional Long Term Goals  Yes      PT LONG TERM GOAL #6   Title  Pt will improve 6MWT by 300' from baseline in order to indicate improved functional endurance.     Time  9    Period  Weeks    Status  New            Plan - 09/17/18 1238    Clinical Impression Statement  Session focused on brief review of stretching HEP, assessment of orthostatics (which was slightly positive from supine to standing), education on likely vestibular hypofunction and corner exercises to address.  Will plan to do floor recovery tomorrow.     Rehab Potential  Good    Clinical Impairments Affecting Rehab Potential  neuropathy, uncertain of reason for adipose tissue in BLEs, chronic small vessel disease    PT Frequency  2x / week    PT Duration  8 weeks    PT Treatment/Interventions  ADLs/Self Care Home Management;Aquatic Therapy;DME Instruction;Gait  training;Stair training;Functional mobility training;Therapeutic activities;Therapeutic exercise;Balance training;Neuromuscular re-education;Patient/family education;Orthotic Fit/Training;Scar mobilization;Passive range of motion;Dry needling;Vestibular    PT Next Visit Plan  STGs 12/29, Floor recovery (maybe in room)  UE strength, balance, LE strengthening (esp hip), endurance.     PT Home Exercise Plan  [email or web address]    Consulted and Agree with Plan of Care  Patient       Patient will benefit from skilled therapeutic intervention in order to improve the following deficits and impairments:  Decreased activity tolerance, Decreased balance, Decreased coordination, Decreased endurance, Decreased knowledge of use of DME, Decreased mobility, Decreased range of motion, Decreased strength, Difficulty walking, Dizziness, Hypomobility, Impaired flexibility, Impaired perceived functional ability, Impaired sensation, Impaired UE functional use, Postural dysfunction  Visit Diagnosis: Unsteadiness on feet  Muscle weakness (generalized)  Other abnormalities of gait and mobility  Repeated falls  Abnormal posture     Problem List Patient Active Problem List   Diagnosis Date Noted  . Gait abnormality 07/10/2018  . S/P reverse total shoulder arthroplasty, 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  . Persistent atrial fibrillation   . Bilateral ureteral calculi 11/17/2017  . Atrial fibrillation with RVR ([place]) 09/15/2017  . Atrial flutter ([place]) 07/17/2017  . Port catheter in place 04/02/2017  . Non-Hodgkin lymphoma, unspecified, intrathoracic lymph nodes ([place]) 02/13/2017  . Mediastinal mass 02/06/2017  . 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 Abnormal chest x-ray   . Lung mass 02/03/2017  . 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  . OSA (obstructive sleep apnea) 02/03/2015  . History of renal calculi 11/16/2014  .  Renal calculi 11/16/2014  . Family history of colon cancer 10/26/2014  . Mechanical complication  due to cardiac pacemaker pulse generator 11/06/2013  . Dyspnea 05/12/2013  . Hypothyroidism 04/21/2013  . Pleural effusion 04/19/2013  . Long term (current) use of anticoagulants 12/20/2010  . EPIDERMOID CYST 08/22/2010  . Chronic diastolic heart failure ([place]) 08/06/2010  . SYNCOPE AND COLLAPSE 07/27/2010  . OSTEOARTHRITIS, KNEE, RIGHT 03/15/2010  . Morbid obesity ([place]) 12/07/2009  . NONSPEC ELEVATION OF LEVELS OF TRANSAMINASE/LDH 09/08/2009  . BREAST CANCER, HX OF 07/25/2009  . COLONIC POLYPS, HX OF 07/25/2009  . TUBULOVILLOUS ADENOMA, COLON 04/29/2008  . HYPERGLYCEMIA, FASTING 04/29/2008  . HYPERLIPIDEMIA 06/22/2007  . CARCINOMA, THYROID GLAND, HX OF 06/22/2007    Cameron Sprang, PT, MPT [facility] [address] [place], [place], [address] Phone: [phone]   Fax:  [phone] 09/17/18, 12:41 PM  Name: Kayla Harrison MRN: [id number] Date of Birth: 14-Oct-1945

## 2018-09-17 NOTE — Patient Instructions (Signed)
Access Code: [email or web address]  URL: [email or web address]  Date: 09/17/2018  Prepared by: Cameron Sprang   Exercises  Sit to Stand with Arms Crossed - 10 reps - 1 sets - 2x daily - 7x weekly  Supine Straight Leg Raises - 10 reps - 3 sets - 1x daily - 7x weekly  Clamshell - 10 reps - 1 sets - 2x daily - 7x weekly  Sidelying Hip Extension in Abduction - 10 reps - 1 sets - 2x daily - 7x weekly  Supine Figure 4 Piriformis Stretch - 3 reps - 1 sets - 15-20 hold - 1x daily - 7x weekly  Supine ITB Stretch with Strap - 3 reps - 1 sets - 15-20 hold - 1-2x daily - 7x weekly  Wide Stance with Head Nods on Foam Pad - 10 reps - 1 sets - 1x daily - 7x weekly  Wide Stance with Eyes Closed on Foam Pad - 3 reps - 1 sets - 20 hold - 1x daily - 7x weekly

## 2018-09-18 ENCOUNTER — Encounter: Payer: Self-pay | Admitting: Rehabilitation

## 2018-09-18 ENCOUNTER — Ambulatory Visit: Payer: Medicare Other | Admitting: Rehabilitation

## 2018-09-18 DIAGNOSIS — M6281 Muscle weakness (generalized): Secondary | ICD-10-CM | POA: Diagnosis not present

## 2018-09-18 DIAGNOSIS — R293 Abnormal posture: Secondary | ICD-10-CM | POA: Diagnosis not present

## 2018-09-18 DIAGNOSIS — R296 Repeated falls: Secondary | ICD-10-CM | POA: Diagnosis not present

## 2018-09-18 DIAGNOSIS — R2681 Unsteadiness on feet: Secondary | ICD-10-CM

## 2018-09-18 DIAGNOSIS — R2689 Other abnormalities of gait and mobility: Secondary | ICD-10-CM | POA: Diagnosis not present

## 2018-09-18 NOTE — Therapy (Signed)
[facility] [address] [place], [place], [address] Phone: [phone]   Fax:  [phone]  Physical Therapy Treatment  Patient Details  Name: Kayla Harrison MRN: [id number] Date of Birth: 1946/07/18 Referring Provider (PT): Sarina Ill, MD   Encounter Date: 09/18/2018  PT End of Session - 09/18/18 1259    Visit Number  7    Number of Visits  17    Date for PT Re-Evaluation  11/12/18    Authorization Type  Medicare and [employer]-needs 10th visit progress note    PT Start Time  1145    PT Stop Time  1230    PT Time Calculation (min)  45 min    Activity Tolerance  Patient tolerated treatment well    Behavior During Therapy  [facility] for tasks assessed/performed       Past Medical History:  Diagnosis Date  . Anemia   . Arthritis    osteoarthritis - knees and right shoulder  . Atrial fibrillation [facility])    ablation- 2x's-- 1st time- [facility], 2nd event at [facility] in 2008. Convergent ablation at [facility] 6/14  . Atrial fibrillation ([place])   . Blood transfusion without reported diagnosis   . Breast cancer ([place])    Dr Margot Chimes, total thyroidectomy- 1999- for cancer  . 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 Chronic bilateral pleural effusions   . Colon polyp    Dr Earlean Shawl  . Complete heart block ([place])   . Complication of anesthesia    Ketamine produces LSD reaction, bright colored nightmarish experience   . Dyslipidemia   . Dyspnea   . Endometriosis   . Fibroids   . H/O pleural effusion    s/p thoracentesis w 3214m withdrawn  . Hepatitis    [place] ?hepatitis   . History of dysphagia    due to radiation therapy  . History of hiatal hernia    small noted on PET scan  . History of kidney stones   . Hx of thyroid cancer    Dr SForde Dandy . Hyperlipidemia   . Hypertension   . Hypothyroidism   . Lung cancer, lower lobe ([place] 01/2017   radiation RX completed 03/04/17; will start chemo 6/27, pt  unaware of lung cancer  . Morbid obesity ([place]    Status post lap band surgery  . Nephrolithiasis   . Non Hodgkin's lymphoma ([place]    on chemotherapy  . Persistent atrial fibrillation    a. s/p PVI 2008 b. s/p convergent ablation 22094complicated by bradycardia requiring pacemaker implant  . Presence of permanent cardiac pacemaker   . Rotator cuff tear    Right  . Sinus node dysfunction ([place]    Complicating convergent ablation 6/14  . [place]    [place]  . SVC syndrome    with lung mass and non hodgkins lymphoma  . Thyroid cancer ([place] 2000    Past Surgical History:  Procedure Laterality Date  . ABDOMINAL HYSTERECTOMY  1983  . afib ablation     a. 2008 PVI b. 2014 convergent ablation  . APPENDECTOMY    . BONE MARROW BIOPSY  02/21/2017  . BREAST LUMPECTOMY Left 2010  . bso  1998  . CARDIAC CATHETERIZATION     2015- negative  . CARDIOVERSION  10/09/2012   Procedure: CARDIOVERSION;  Surgeon: JMinus Breeding MD;  [place]  Service: Cardiovascular;  Laterality: N/A;  . CARDIOVERSION  10/09/2012  Procedure: CARDIOVERSION;  Surgeon: Minus Breeding, MD;  Location: [facility] ENDOSCOPY;  Service: Cardiovascular;  Laterality: N/A;  [name] gave the ok to add pt to the add on , but we must check to find out if the can add pt on at [phone] ( [phone]  . CARDIOVERSION N/A 11/20/2012   Procedure: CARDIOVERSION;  Surgeon: Fay Records, MD;  Location: [facility] ENDOSCOPY;  Service: Cardiovascular;  Laterality: N/A;  . CARDIOVERSION N/A 07/18/2017   Procedure: CARDIOVERSION;  Surgeon: Thayer Headings, MD;  Location: [facility] ENDOSCOPY;  Service: Cardiovascular;  Laterality: N/A;  . CARDIOVERSION N/A 10/03/2017   Procedure: CARDIOVERSION;  Surgeon: Sanda Klein, MD;  Location: [facility] ENDOSCOPY;  Service: Cardiovascular;  Laterality: N/A;  . CARDIOVERSION N/A 01/07/2018   Procedure: CARDIOVERSION;  Surgeon: Acie Fredrickson Wonda Cheng, MD;  Location: [place];  Service: Cardiovascular;  Laterality: N/A;  .  CHOLECYSTECTOMY    . COLONOSCOPY W/ POLYPECTOMY     Dr Earlean Shawl  . CYSTOSCOPY N/A 02/06/2015   Procedure: CYSTOSCOPY;  Surgeon: Kathie Rhodes, MD;  Location: [facility];  Service: Urology;  Laterality: N/A;  . CYSTOSCOPY W/ RETROGRADES Left 11/17/2017   Procedure: CYSTOSCOPY WITH RETROGRADE /PYELOGRAM/;  Surgeon: Kathie Rhodes, MD;  Location: [facility];  Service: Urology;  Laterality: Left;  . CYSTOSCOPY WITH RETROGRADE PYELOGRAM, URETEROSCOPY AND STENT PLACEMENT Right 02/06/2015   Procedure: RETROGRADE PYELOGRAM, RIGHT URETEROSCOPY STENT PLACEMENT;  Surgeon: Kathie Rhodes, MD;  Location: [facility];  Service: Urology;  Laterality: Right;  . CYSTOSCOPY WITH RETROGRADE PYELOGRAM, URETEROSCOPY AND STENT PLACEMENT Right 03/07/2017   Procedure: CYSTOSCOPY WITH RIGHT RETROGRADE PYELOGRAM,RIGHT  URETEROSCOPYLASER LITHOTRIPSY  AND STENT PLACEMENT AND STONE BASKETRY;  Surgeon: Kathie Rhodes, MD;  Location: [place];  Service: Urology;  Laterality: Right;  . EYE SURGERY     cataract surgery  . fatty mass removal  1999   pubic area  . HOLMIUM LASER APPLICATION N/A 9/45/0388   Procedure: HOLMIUM LASER APPLICATION;  Surgeon: Kathie Rhodes, MD;  Location: [facility];  Service: Urology;  Laterality: N/A;  . HOLMIUM LASER APPLICATION Right 05/20/33   Procedure: HOLMIUM LASER APPLICATION;  Surgeon: Kathie Rhodes, MD;  Location: [facility];  Service: Urology;  Laterality: Right;  . HOLMIUM LASER APPLICATION Left 06/09/9149   Procedure: HOLMIUM LASER APPLICATION;  Surgeon: Kathie Rhodes, MD;  [facility];  Service: Urology;  Laterality: Left;  . IR FLUORO GUIDE PORT INSERTION RIGHT  02/24/2017  . IR NEPHROSTOMY PLACEMENT RIGHT  11/17/2017  . IR PATIENT EVAL TECH 0-60 MINS  03/11/2017  . IR REMOVAL TUN ACCESS W/ PORT W/O FL MOD SED  04/20/2018  . IR US GUIDE VASC ACCESS RIGHT  02/24/2017  . KNEE ARTHROSCOPY     bilateral  . LAPAROSCOPIC GASTRIC BANDING  07/10/2010  . LAPAROSCOPIC GASTRIC BANDING      Laparoscopic adjustable banding APS System with posterior hiatal hernia, 2 suture.  Marland Kitchen LAPAROTOMY     for ruptured ovary and ovarian artery   . NEPHROLITHOTOMY Right 11/17/2017   Procedure: NEPHROLITHOTOMY PERCUTANEOUS;  Surgeon: Kathie Rhodes, MD;  [facility];  Service: Urology;  Laterality: Right;  . PACEMAKER INSERTION  03/10/2013   MDT dual chamber PPM  . POCKET REVISION N/A 12/08/2013   Procedure: POCKET REVISION;  Surgeon: Deboraha Sprang, MD;  Location[facility] CATH LAB;  Service: Cardiovascular;  Laterality: N/A;  . PORTA CATH INSERTION    . REVERSE SHOULDER ARTHROPLASTY Right 05/14/2018   Procedure: RIGHT REVERSE SHOULDER ARTHROPLASTY;  Surgeon: Tania Ade, MD;  Location: [place];  Service: Orthopedics;  Laterality: Right;  . REVERSE SHOULDER REPLACEMENT Right 05/14/2018  . TEE WITH CARDIOVERSION  09/22/2017  . TEE WITHOUT CARDIOVERSION N/A 10/03/2017   Procedure: TRANSESOPHAGEAL ECHOCARDIOGRAM (TEE);  Surgeon: Sanda Klein, MD;  Location: [place];  Service: Cardiovascular;  Laterality: N/A;  . THYROIDECTOMY  1998   Dr Margot Chimes  . TONSILLECTOMY    . TOTAL KNEE ARTHROPLASTY  04/13/2012   Procedure: TOTAL KNEE ARTHROPLASTY;  Surgeon: Rudean Haskell, MD;  Location: [place];  Service: Orthopedics;  Laterality: Right;  Marland Kitchen VIDEO BRONCHOSCOPY WITH ENDOBRONCHIAL ULTRASOUND N/A 02/07/2017   Procedure: VIDEO BRONCHOSCOPY WITH ENDOBRONCHIAL ULTRASOUND;  Surgeon: Marshell Garfinkel, MD;  Location: [place];  Service: Pulmonary;  Laterality: N/A;    There were no vitals filed for this visit.  Subjective Assessment - 09/18/18 1149    Subjective  Pt reports not having good day today.     Pertinent History  lymphoma (had chemotherapy), vertigo, small vessel disease, arthritis (has had R knee and reverse total shoulder on R)    Limitations  Walking;House hold activities    Patient Stated Goals  "I want to be able to walk in a normal fashion"     Currently in Pain?  No/denies                        [facility] Adult PT Treatment/Exercise - 09/18/18 1231      Transfers   Transfers  Sit to Stand;Stand to Sit    Sit to Stand  6: Modified independent (Device/Increase time)    Stand to Sit  6: Modified independent (Device/Increase time)    Comments  Pt able to stand several times during session without UE support.       Ambulation/Gait   Ambulation/Gait  Yes    Ambulation/Gait Assistance  5: Supervision;4: Min guard    Ambulation/Gait Assistance Details  Pt with increased instability today with gait.  Pt did ambulate throughout session without AD, however PT provided closer S to min/guard for safety.  Pt with some light headedness during session.  BP was WFL.     Ambulation Distance (Feet)  100 Feet    Assistive device  None    Gait Pattern  Step-through pattern;Decreased arm swing - right;Decreased arm swing - left;Decreased stride length;Right flexed knee in stance;Left flexed knee in stance;Left foot flat;Lateral hip instability;Trendelenburg;Wide base of support;Poor foot clearance - left;Poor foot clearance - right    Ambulation Surface  Level;Indoor      Therapeutic Activites    Therapeutic Activities  Other Therapeutic Activities    Other Therapeutic Activities  Performed floor recovery during session.  Set up mat on floor in treatment room so that she could use lower mat for better UE support when getting up.  During first attempt, she lowered straight to floor with PT having to provide max A for safety (landed on 4 stacked pillows) then was able to get back up to knees, got into R half kneeling and required mod A to elevate enough to bring LEs under her and turn to mat.  Had pt perform again, this time facing mat, placing BUEs on mat and then lowering to knees then to L hip on pillows.  Pt then able to get back to knees and R half kneeling again, required only min/mod A this time to elevate to feet and turn and sit.  Pt very fatigued following second trial,  therefore allowed increased rest and water.  Neuro Re-ed    Neuro Re-ed Details   Continue high level balance during session standing on uneven surfaces.  Standing on foam airex in // bars with feet apart, maintaining balance x 2 reps of 20 secs, marching on foam x 2 sets of 10 reps with cues for slower marching and decreasing UE support, standing on foam tapping cones x 10 reps.  This was very difficult, therefore transitioned to standing on solid ground performing cone taps with emphasis on improved stance leg control/muscle activation.  Progressed to tipping cone over and back upright x 10 reps, all with intermittent to single UE support. Standing on small rocker board biased first vertically then horizontally maintaining balance x 2 sets of 20 secs each direction.  Note that horizontal direction was extremely difficult and caused pt to become more light headed, therefore discontinued activity and assessed BP.  BP was normal (110/82).        Exercises   Exercises  Knee/Hip    Other Exercises   Modified push ups from elevated mat x 2 sets of 10 reps.  Verbally added to HEP, will add picture at next session.  cues for technique.       Knee/Hip Exercises: Aerobic   Stepper  BUEs/LEs x 5 mins at level 2 resistance maintaining RPMs in 80's throughout.  Pt tolerated well.              PT Education - 09/18/18 1258    Education Details  strategies with floor recovery     Person(s) Educated  Patient    Methods  Explanation    Comprehension  Verbalized understanding       PT Short Term Goals - 09/01/18 1540      PT SHORT TERM GOAL #1   Title  Pt will be independent with initial HEP in order to indicate decreased fall risk and improved functional mobility (Target Date: 09/20/18)    Time  5    Period  Weeks    Status  New      PT SHORT TERM GOAL #2   Title  Pt will perform DGI and improve score 3 points from baseline in order to indicate decreased fall risk.      Baseline  15/24 on  12/10    Time  5    Period  Weeks    Status  New      PT SHORT TERM GOAL #3   Title  Pt will improve gait speed to >/=2.62 ft/sec w/ LRAD in order to indicate safe community ambulator.     Time  5    Period  Weeks    Status  New      PT SHORT TERM GOAL #4   Title  Will assess need for AFO/support for LLE and request order as appropriate.      Time  5    Period  Weeks    Status  New      PT SHORT TERM GOAL #5   Title  Will assess 6MWT and improve distance by 150' in order to indicate improved functional endurance.     Baseline  859' on 12/10    Time  5    Period  Weeks    Status  New      PT SHORT TERM GOAL #6   Title  Pt will perform floor recovery at mod A level in order to indiciate improved ability to get up in case of fall.  Time  5    Period  Weeks    Status  New      PT SHORT TERM GOAL #7   Title  Pt will negotiate up/down curb step with LRAD at mod I level in order to indicate safe community negotiation.     Time  5    Period  Weeks    Status  New        PT Long Term Goals - 08/14/18 1508      PT LONG TERM GOAL #1   Title  Pt will be independent with final HEP in order to indicate decreased fall risk and improved functional mobility.  (Target Date: 10/20/18    Time  9    Period  Weeks    Status  New    Target Date  10/20/18      PT LONG TERM GOAL #2   Title  Pt will perform 5TSS in <11 secs without UE support and without using backs of legs on seated surface in order to indicate improved BLE strength.      Time  9    Period  Weeks    Status  New      PT LONG TERM GOAL #3   Title  Pt will ambulate x 500' w/ LRAD over unlevel outdoor paved surfaces at mod I level in order to indicate safe community mobility.      Time  9    Period  Weeks    Status  New      PT LONG TERM GOAL #4   Title  Pt will improve DGI by 6 points from baseline in order to indicate decreased fall risk.     Time  9    Period  Weeks    Status  New      PT LONG TERM GOAL #5    Title  Pt will perform floor recovery at min A level in order to indicate improved ability to get up from floor in case of fall.     Time  9    Period  Weeks    Status  New      Additional Long Term Goals   Additional Long Term Goals  Yes      PT LONG TERM GOAL #6   Title  Pt will improve 6MWT by 300' from baseline in order to indicate improved functional endurance.     Time  9    Period  Weeks    Status  New            Plan - 09/18/18 1259    Clinical Impression Statement  Skilled session focused on floor recovery which pt was able to do today with min/mod A.  Will need to continue to work on LE and UE strengthening to decrease assist needed for floor recovery.  Also continue to work on higher level balance on uneven surfaces.  Pt with increased unsteadiness today and reports of light headedness.  Reports "I'm just having one of those days.  Usually I don't leave the house when I have a day like this."     Rehab Potential  Good    Clinical Impairments Affecting Rehab Potential  neuropathy, uncertain of reason for adipose tissue in BLEs, chronic small vessel disease    PT Frequency  2x / week    PT Duration  8 weeks    PT Treatment/Interventions  ADLs/Self Care Home Management;Aquatic Therapy;DME Instruction;Gait training;Stair training;Functional mobility training;Therapeutic activities;Therapeutic exercise;Balance training;Neuromuscular  re-education;Patient/family education;Orthotic Fit/Training;Scar mobilization;Passive range of motion;Dry needling;Vestibular    PT Next Visit Plan  STGs 12/29,  UE strength, balance, LE strengthening (esp hip), endurance.     PT Home Exercise Plan  [id number]    Consulted and Agree with Plan of Care  Patient       Patient will benefit from skilled therapeutic intervention in order to improve the following deficits and impairments:  Decreased activity tolerance, Decreased balance, Decreased coordination, Decreased endurance, Decreased knowledge of  use of DME, Decreased mobility, Decreased range of motion, Decreased strength, Difficulty walking, Dizziness, Hypomobility, Impaired flexibility, Impaired perceived functional ability, Impaired sensation, Impaired UE functional use, Postural dysfunction  Visit Diagnosis: Unsteadiness on feet  Muscle weakness (generalized)  Other abnormalities of gait and mobility  Repeated falls  Abnormal posture     Problem List Patient Active Problem List   Diagnosis Date Noted  . Gait abnormality 07/10/2018  . S/P reverse total shoulder arthroplasty, 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  . Persistent atrial fibrillation   . Bilateral ureteral calculi 11/17/2017  . Atrial fibrillation with RVR ([place]) 09/15/2017  . Atrial flutter ([place]) 07/17/2017  . Port catheter in place 04/02/2017  . Non-Hodgkin lymphoma, unspecified, intrathoracic lymph nodes ([place]) 02/13/2017  . Mediastinal mass 02/06/2017  . 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 Abnormal chest x-ray   . Lung mass 02/03/2017  . 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  . OSA (obstructive sleep apnea) 02/03/2015  . History of renal calculi 11/16/2014  . Renal calculi 11/16/2014  . Family history of colon cancer 10/26/2014  . Mechanical complication due to cardiac pacemaker pulse generator 11/06/2013  . Dyspnea 05/12/2013  . Hypothyroidism 04/21/2013  . Pleural effusion 04/19/2013  . Long term (current) use of anticoagulants 12/20/2010  . EPIDERMOID CYST 08/22/2010  . Chronic diastolic heart failure ([place]) 08/06/2010  . SYNCOPE AND COLLAPSE 07/27/2010  . OSTEOARTHRITIS, KNEE, RIGHT 03/15/2010  . Morbid obesity ([place]) 12/07/2009  . NONSPEC ELEVATION OF LEVELS OF TRANSAMINASE/LDH 09/08/2009  . BREAST CANCER, HX OF 07/25/2009  . COLONIC POLYPS, HX OF 07/25/2009  . TUBULOVILLOUS ADENOMA, COLON 04/29/2008  . HYPERGLYCEMIA, FASTING 04/29/2008  . HYPERLIPIDEMIA 06/22/2007  . CARCINOMA, THYROID GLAND, HX OF 06/22/2007    Cameron Sprang, PT,  [facility] [address] [place], [place], [address] Phone: [phone]   Fax:  [phone] 09/18/18, 1:06 PM  Name: Kayla Harrison MRN: [id number] Date of Birth: 09/12/1946

## 2018-09-22 ENCOUNTER — Encounter: Payer: Self-pay | Admitting: Rehabilitation

## 2018-09-22 ENCOUNTER — Ambulatory Visit: Payer: Medicare Other | Admitting: Rehabilitation

## 2018-09-22 DIAGNOSIS — R2681 Unsteadiness on feet: Secondary | ICD-10-CM

## 2018-09-22 DIAGNOSIS — R2689 Other abnormalities of gait and mobility: Secondary | ICD-10-CM | POA: Diagnosis not present

## 2018-09-22 DIAGNOSIS — R296 Repeated falls: Secondary | ICD-10-CM | POA: Diagnosis not present

## 2018-09-22 DIAGNOSIS — M6281 Muscle weakness (generalized): Secondary | ICD-10-CM

## 2018-09-22 DIAGNOSIS — R293 Abnormal posture: Secondary | ICD-10-CM | POA: Diagnosis not present

## 2018-09-22 IMAGING — CT NM PET TUM IMG INITIAL (PI) SKULL BASE T - THIGH
8 series · 25 of 25 positions shown · non-contrast
Comparison: CT chest 02/03/2017 and CT abdomen pelvis 05/18/2015.

CLINICAL DATA: Initial treatment strategy for mediastinal large
B-cell lymphoma.

EXAM:
NUCLEAR MEDICINE PET SKULL BASE TO THIGH
TECHNIQUE: 11.6 mCi F-18 FDG was injected intravenously. Full-ring PET imaging
was performed from the skull base to thigh after the radiotracer. CT
data was obtained and used for attenuation correction and anatomic
localization.
FASTING BLOOD GLUCOSE:  Value: 107 mg/dl

[Series 3: pet sk_thigh ac · axial · 5.0mm · 4.07mm/px · z∈[-556,+356]mm · 5 of 229 slices shown]
[im 1/229]
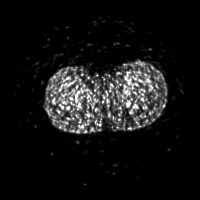
[im 58/229]
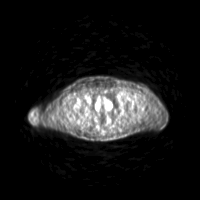
[im 115/229]
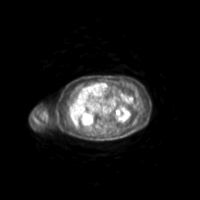
[im 172/229]
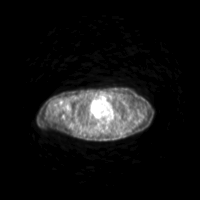
[im 229/229]
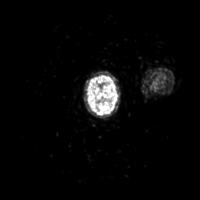

[Series 4: ct sk_thigh 5.0 hd_fov · axial · 5.0mm · 1.22mm/px · z∈[-556,+356]mm · 5 of 224 slices shown]
[im 1/224]
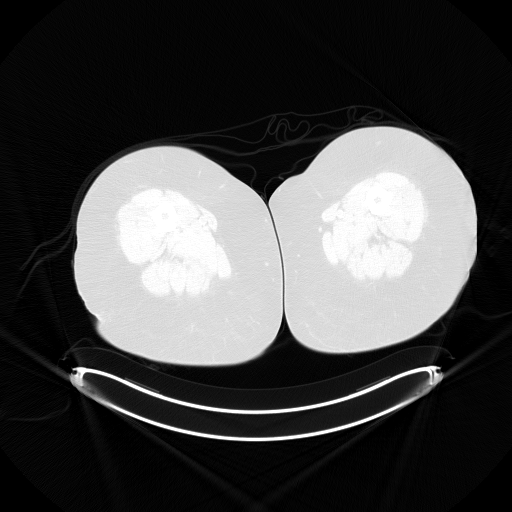
[im 56/224]
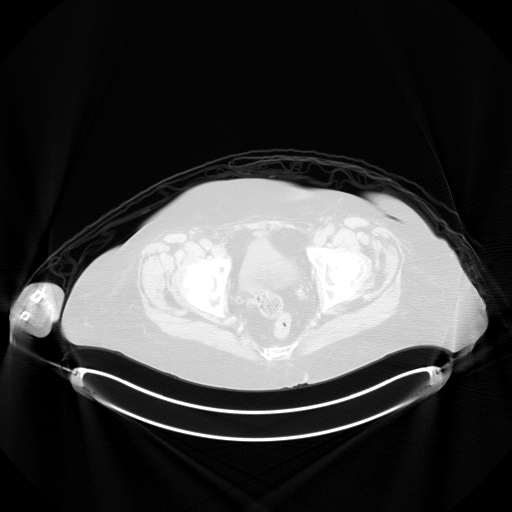
[im 112/224]
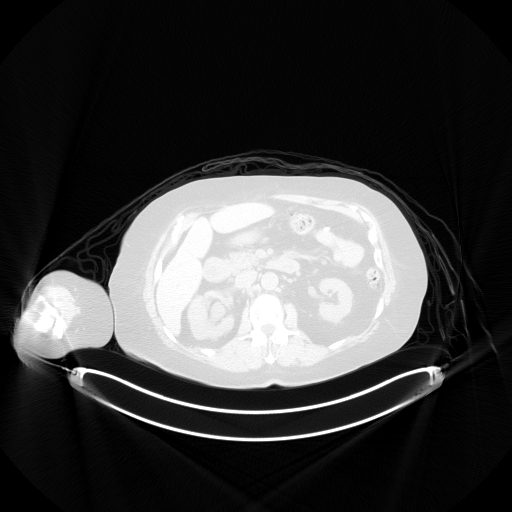
[im 168/224]
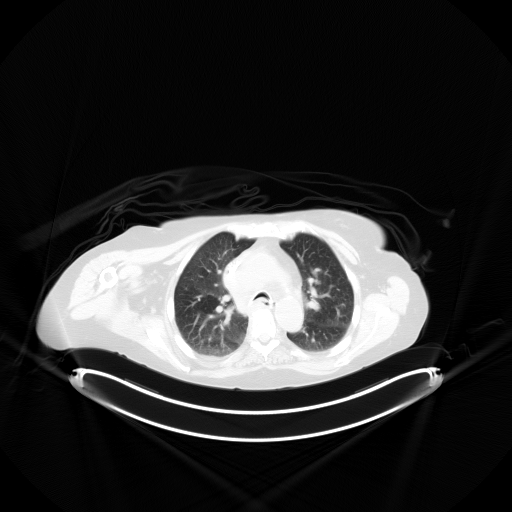
[im 224/224]
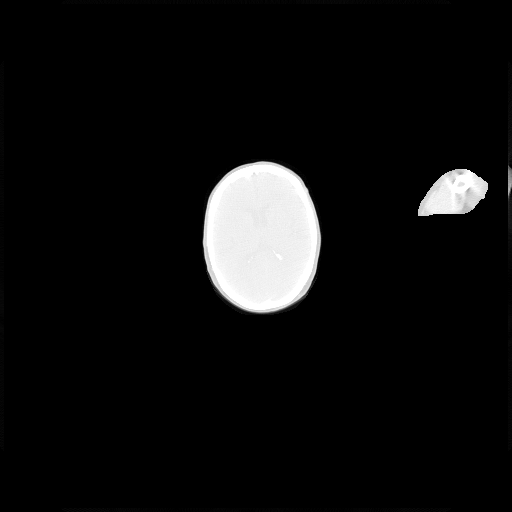

[Series 7: pet sk_thigh nac · axial · 5.0mm · 4.07mm/px · z∈[-556,+356]mm · 5 of 229 slices shown]
[im 1/229]
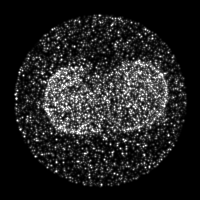
[im 58/229]
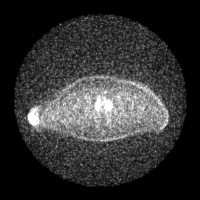
[im 115/229]
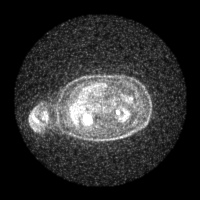
[im 172/229]
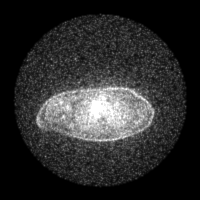
[im 229/229]
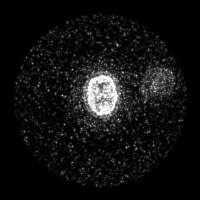

[Series 8: ct sk_thigh 5.0 b70f (id)_bone · axial · 5.0mm · 0.72mm/px · 1 of 64 slices shown]
[im 1/64  bone]
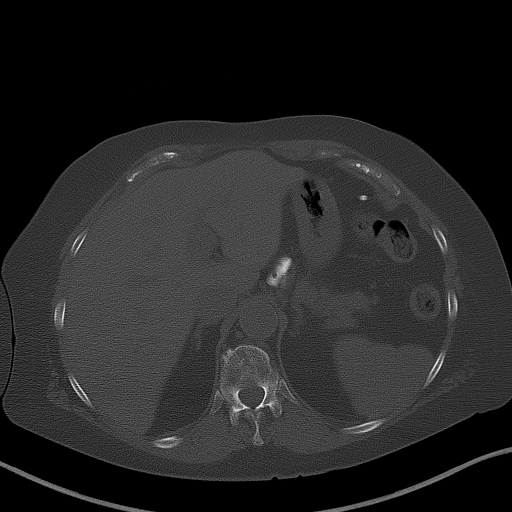

[Series 604: mip collection · coronal · 1.89mm/px · 1 of 32 slices shown]
[im 1/32]
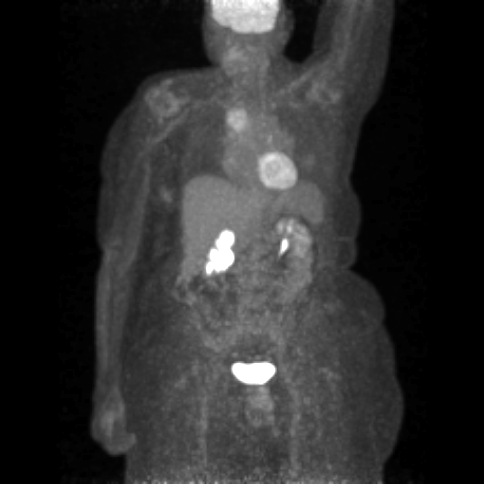

[Series 605: range-ct sk_thigh 5.0 hd_fov-cor-<alpha range> · 2 of 110 slices shown]
[im 1/110]
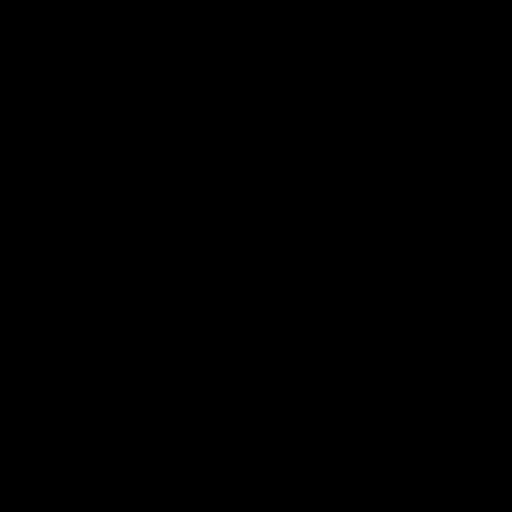
[im 110/110]
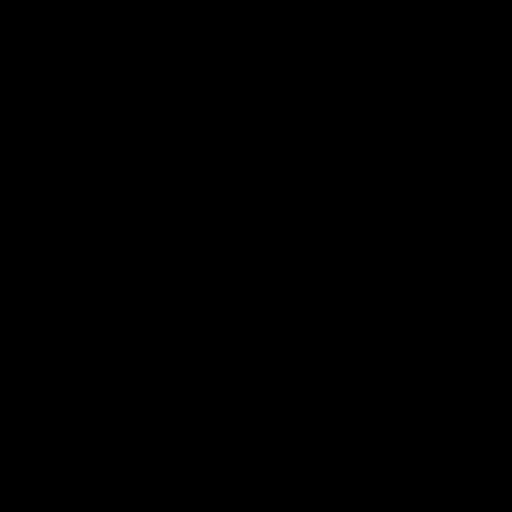

[Series 606: range-ct sk_thigh 5.0 hd_fov-tra-<alpha range> · 5 of 219 slices shown]
[im 1/219]
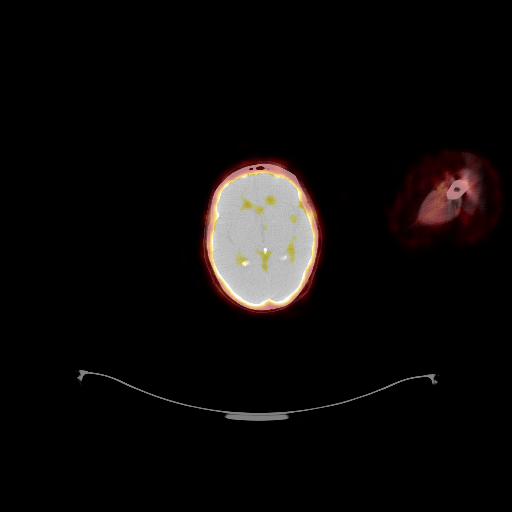
[im 55/219]
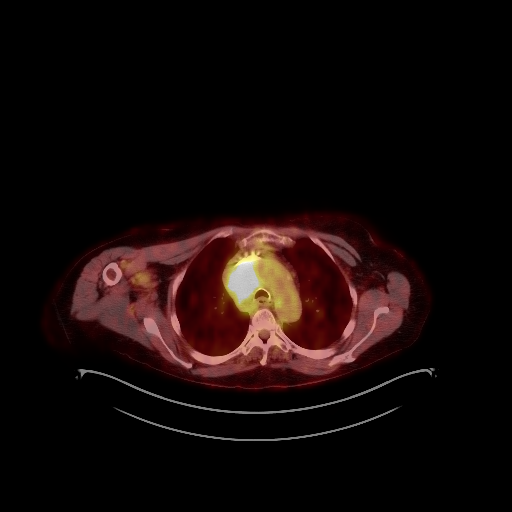
[im 110/219]
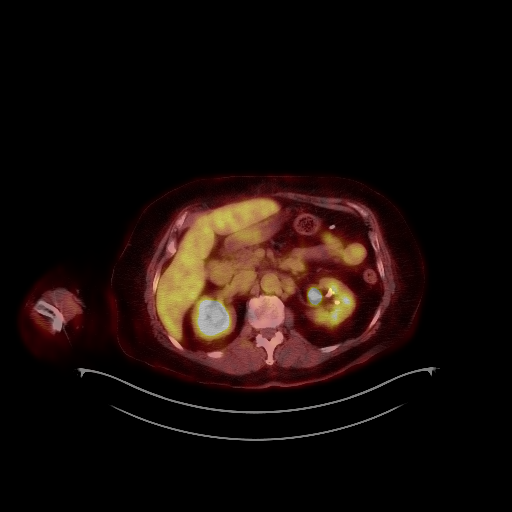
[im 164/219]
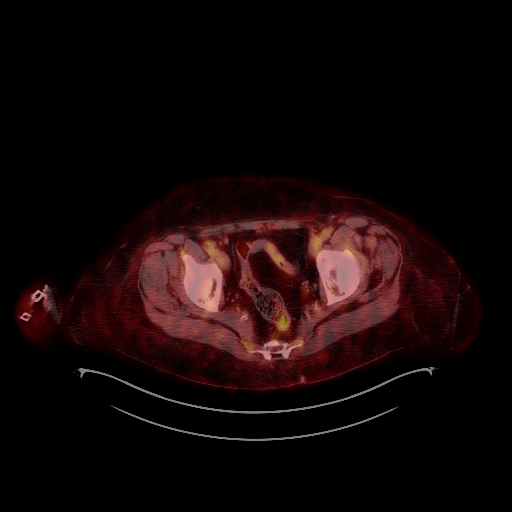
[im 219/219]
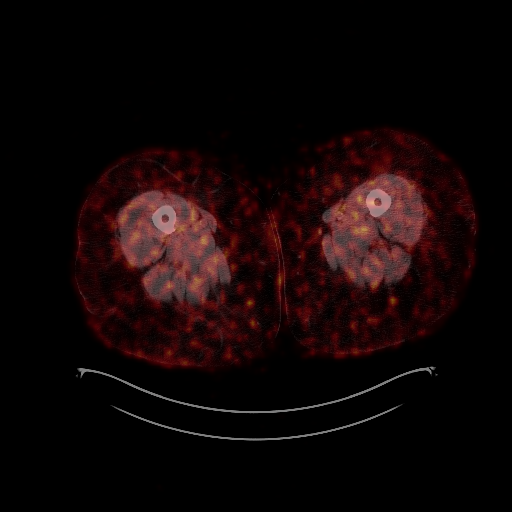

[Series 1032: results mm oncology reading · 1.27mm/px · 1 of 1 slices shown]
[im 1/1]
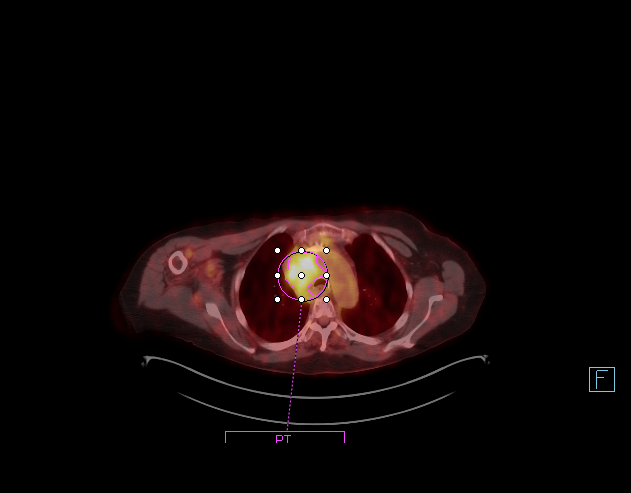

[25 of 25 positions shown; findings below may reference images not displayed]

FINDINGS: NECK

No hypermetabolic lymph nodes in the neck. CT images show no acute
findings.

CHEST

Right paratracheal nodal mass measures approximately 4.1 x 4.3 cm
with an SUV max of 9.1. No additional hypermetabolic lymph nodes in
the chest. 7 mm left lower lobe nodule (series 8, image 30), too
small for PET resolution. Atherosclerotic calcification of the aorta
and coronary arteries. Heart is mildly enlarged. No pericardial or
pleural effusion.

ABDOMEN/PELVIS

No abnormal hypermetabolism in the liver, adrenal glands, spleen or
pancreas. No hypermetabolic lymph nodes. 10 mm low-attenuation
lesion in segment 4 of the liver is unchanged, indicative of a
benign cyst or hemangioma. Liver is otherwise unremarkable.
Gallbladder is absent. Pancreas and spleen are unremarkable. There
is a 9 mm peripherally calcified splenic artery aneurysm in the
hilum, stable. Adrenal glands are unremarkable. Moderate to severe
right hydronephrosis. There is a 1.4 cm stone in the right renal
pelvis, at the ureteropelvic junction, as before. A 9 mm stone in
the proximal right ureter is new. Remainder of the right ureter is
decompressed. Stones are seen in the left kidney. Left ureter is
decompressed. Bladder is grossly unremarkable. Laparoscopic band is
seen at the proximal stomach. Stomach and bowel are otherwise
grossly unremarkable.

SKELETON

No abnormal osseous hypermetabolism.
IMPRESSION: 1. Hypermetabolic right paratracheal nodal mass, consistent with the
given history of lymphoma. No evidence of hypermetabolic
extrathoracic lymphoma.
2. Moderate to severe right hydronephrosis with stones at the right
ureteral pelvic junction (chronic) and proximal right ureter (new).
These results will be called to the ordering clinician or
representative by the Radiologist Assistant, and communication
documented in the PACS or zVision Dashboard.
3. 7 mm left lower lobe nodule, too small for PET resolution.
4. Left renal stones.
5. Aortic atherosclerosis (H681G-170.0). Coronary artery
calcification.

## 2018-09-22 NOTE — Therapy (Signed)
[facility] [address] [place], [place], [address] Phone: [phone]   Fax:  [phone]  Physical Therapy Treatment  Patient Details  Name: Kayla Harrison MRN: [id number] Date of Birth: [birth year] Referring Provider (PT): Sarina Ill, MD   Encounter Date: 09/22/2018  PT End of Session - 09/22/18 1025    Visit Number  8    Number of Visits  17    Date for PT Re-Evaluation  11/12/18    Authorization Type  Medicare and [employer]-needs 10th visit progress note    PT Start Time  1015    PT Stop Time  1100    PT Time Calculation (min)  45 min    Activity Tolerance  Patient tolerated treatment well    Behavior During Therapy  [facility] for tasks assessed/performed       Past Medical History:  Diagnosis Date  . Anemia   . Arthritis    osteoarthritis - knees and right shoulder  . Atrial fibrillation [facility])    ablation- 2x's-- 1st time- [facility], 2nd event at [facility] in 2008. Convergent ablation at [facility] 6/14  . Atrial fibrillation ([place])   . Blood transfusion without reported diagnosis   . Breast cancer ([place])    Dr Margot Chimes, total thyroidectomy- 1999- for cancer  . 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 Chronic bilateral pleural effusions   . Colon polyp    Dr Earlean Shawl  . Complete heart block ([place])   . Complication of anesthesia    Ketamine produces LSD reaction, bright colored nightmarish experience   . Dyslipidemia   . Dyspnea   . Endometriosis   . Fibroids   . H/O pleural effusion    s/p thoracentesis w 3221m withdrawn  . Hepatitis    [place] ?hepatitis   . History of dysphagia    due to radiation therapy  . History of hiatal hernia    small noted on PET scan  . History of kidney stones   . Hx of thyroid cancer    Dr SForde Dandy . Hyperlipidemia   . Hypertension   . Hypothyroidism   . Lung cancer, lower lobe ([place]   radiation RX completed 03/04/17; will start chemo 6/27, pt  unaware of lung cancer  . Morbid obesity ([place]    Status post lap band surgery  . Nephrolithiasis   . Non Hodgkin's lymphoma ([place]    on chemotherapy  . Persistent atrial fibrillation    a. s/p PVI 2008 b. s/p convergent ablation 21950complicated by bradycardia requiring pacemaker implant  . Presence of permanent cardiac pacemaker   . Rotator cuff tear    Right  . Sinus node dysfunction ([place]    Complicating convergent ablation 6/14  . [place]    [place]  . SVC syndrome    with lung mass and non hodgkins lymphoma  . Thyroid cancer [place]    Past Surgical History:  Procedure Laterality Date  . ABDOMINAL HYSTERECTOMY  1983  . afib ablation     a. 2008 PVI b. 2014 convergent ablation  . APPENDECTOMY    . BONE MARROW BIOPSY  02/21/2017  . BREAST LUMPECTOMY Left 2010  . bso  1998  . CARDIAC CATHETERIZATION     2015- negative  . CARDIOVERSION  10/09/2012   Procedure: CARDIOVERSION;  Surgeon: JMinus Breeding MD;  [place]  Service: Cardiovascular;  Laterality: N/A;  . CARDIOVERSION  10/09/2012  Procedure: CARDIOVERSION;  Surgeon: Minus Breeding, MD;  Location: [facility] ENDOSCOPY;  Service: Cardiovascular;  Laterality: N/A;  [name] gave the ok to add pt to the add on , but we must check to find out if the can add pt on at [phone] ( [phone])  . CARDIOVERSION N/A 11/20/2012   Procedure: CARDIOVERSION;  Surgeon: Fay Records, MD;  Location: [facility] ENDOSCOPY;  Service: Cardiovascular;  Laterality: N/A;  . CARDIOVERSION N/A 07/18/2017   Procedure: CARDIOVERSION;  Surgeon: Thayer Headings, MD;  Location: [facility] ENDOSCOPY;  Service: Cardiovascular;  Laterality: N/A;  . CARDIOVERSION N/A 10/03/2017   Procedure: CARDIOVERSION;  Surgeon: Sanda Klein, MD;  Location: [facility] ENDOSCOPY;  Service: Cardiovascular;  Laterality: N/A;  . CARDIOVERSION N/A 01/07/2018   Procedure: CARDIOVERSION;  Surgeon: Acie Fredrickson Wonda Cheng, MD;  Location: [place];  Service: Cardiovascular;  Laterality: N/A;  .  CHOLECYSTECTOMY    . COLONOSCOPY W/ POLYPECTOMY     Dr Earlean Shawl  . CYSTOSCOPY N/A 02/06/2015   Procedure: CYSTOSCOPY;  Surgeon: Kathie Rhodes, MD;  [facility]: [facility];  Service: Urology;  Laterality: N/A;  . CYSTOSCOPY W/ RETROGRADES Left 11/17/2017   Procedure: CYSTOSCOPY WITH RETROGRADE /PYELOGRAM/;  Surgeon: Kathie Rhodes, MD;  Location: [facility];  Service: Urology;  Laterality: Left;  . CYSTOSCOPY WITH RETROGRADE PYELOGRAM, URETEROSCOPY AND STENT PLACEMENT Right 02/06/2015   Procedure: RETROGRADE PYELOGRAM, RIGHT URETEROSCOPY STENT PLACEMENT;  Surgeon: Kathie Rhodes, MD;  [facility];  Service: Urology;  Laterality: Right;  . CYSTOSCOPY WITH RETROGRADE PYELOGRAM, URETEROSCOPY AND STENT PLACEMENT Right 03/07/2017   Procedure: CYSTOSCOPY WITH RIGHT RETROGRADE PYELOGRAM,RIGHT  URETEROSCOPYLASER LITHOTRIPSY  AND STENT PLACEMENT AND STONE BASKETRY;  Surgeon: Kathie Rhodes, MD;  Location: [place];  Service: Urology;  Laterality: Right;  . EYE SURGERY     cataract surgery  . fatty mass removal  1999   pubic area  . HOLMIUM LASER APPLICATION N/A 9/45/0388   Procedure: HOLMIUM LASER APPLICATION;  Surgeon: Kathie Rhodes, MD;  [facility];  Service: Urology;  Laterality: N/A;  . HOLMIUM LASER APPLICATION Right 05/20/33   Procedure: HOLMIUM LASER APPLICATION;  Surgeon: Kathie Rhodes, MD;  Location: [facility];  Service: Urology;  Laterality: Right;  . HOLMIUM LASER APPLICATION Left 06/09/9149   Procedure: HOLMIUM LASER APPLICATION;  Surgeon: Kathie Rhodes, MD;  [facility];  Service: Urology;  Laterality: Left;  . IR FLUORO GUIDE PORT INSERTION RIGHT  02/24/2017  . IR NEPHROSTOMY PLACEMENT RIGHT  11/17/2017  . IR PATIENT EVAL TECH 0-60 MINS  03/11/2017  . IR REMOVAL TUN ACCESS W/ PORT W/O FL MOD SED  04/20/2018  . IR US GUIDE VASC ACCESS RIGHT  02/24/2017  . KNEE ARTHROSCOPY     bilateral  . LAPAROSCOPIC GASTRIC BANDING  07/10/2010  . LAPAROSCOPIC GASTRIC BANDING      Laparoscopic adjustable banding APS System with posterior hiatal hernia, 2 suture.  Marland Kitchen LAPAROTOMY     for ruptured ovary and ovarian artery   . NEPHROLITHOTOMY Right 11/17/2017   Procedure: NEPHROLITHOTOMY PERCUTANEOUS;  Surgeon: Kathie Rhodes, MD;  [facility];  Service: Urology;  Laterality: Right;  . PACEMAKER INSERTION  03/10/2013   MDT dual chamber PPM  . POCKET REVISION N/A 12/08/2013   Procedure: POCKET REVISION;  Surgeon: Deboraha Sprang, MD;  Location: [facility] CATH LAB;  Service: Cardiovascular;  Laterality: N/A;  . PORTA CATH INSERTION    . REVERSE SHOULDER ARTHROPLASTY Right 05/14/2018   Procedure: RIGHT REVERSE SHOULDER ARTHROPLASTY;  Surgeon: Tania Ade, MD;  Location: [place];  Service: Orthopedics;  Laterality: Right;  . REVERSE SHOULDER REPLACEMENT Right 05/14/2018  . TEE WITH CARDIOVERSION  09/22/2017  . TEE WITHOUT CARDIOVERSION N/A 10/03/2017   Procedure: TRANSESOPHAGEAL ECHOCARDIOGRAM (TEE);  Surgeon: Sanda Klein, MD;  Location: [place];  Service: Cardiovascular;  Laterality: N/A;  . THYROIDECTOMY  1998   Dr Margot Chimes  . TONSILLECTOMY    . TOTAL KNEE ARTHROPLASTY  04/13/2012   Procedure: TOTAL KNEE ARTHROPLASTY;  Surgeon: Rudean Haskell, MD;  Location: [place];  Service: Orthopedics;  Laterality: Right;  Marland Kitchen VIDEO BRONCHOSCOPY WITH ENDOBRONCHIAL ULTRASOUND N/A 02/07/2017   Procedure: VIDEO BRONCHOSCOPY WITH ENDOBRONCHIAL ULTRASOUND;  Surgeon: Marshell Garfinkel, MD;  Location: [place];  Service: Pulmonary;  Laterality: N/A;    There were no vitals filed for this visit.  Subjective Assessment - 09/22/18 1025    Subjective  "I want to do the floor transfer 2 times at every session."     Pertinent History  lymphoma (had chemotherapy), vertigo, small vessel disease, arthritis (has had R knee and reverse total shoulder on R)    Limitations  Walking;House hold activities    Patient Stated Goals  "I want to be able to walk in a normal fashion"     Currently in Pain?   No/denies         [facility] PT Assessment - 09/22/18 1030      Ambulation/Gait   Ambulation/Gait  Yes    Ambulation/Gait Assistance  6: Modified independent (Device/Increase time)    Ambulation Distance (Feet)  400 Feet   plus 1010 for 6MWT   Assistive device  Straight cane;None    Gait Pattern  Step-through pattern;Decreased arm swing - right;Decreased arm swing - left;Decreased stride length;Right flexed knee in stance;Left flexed knee in stance;Left foot flat;Lateral hip instability;Trendelenburg;Wide base of support;Poor foot clearance - left;Poor foot clearance - right    Ambulation Surface  Level;Indoor    Gait velocity  3.50 ft/sec with cane, 3.64 ft/sec without cane, increase in L foot slap without cane     Stairs  Yes    Stairs Assistance  6: Modified independent (Device/Increase time)    Stair Management Technique  One rail Right;Alternating pattern;Forwards    Number of Stairs  4    Height of Stairs  6      6 Minute Walk- Baseline   6 Minute Walk- Baseline  yes    BP (mmHg)  122/84    HR (bpm)  75    02 Sat (%RA)  97 %    Modified Borg Scale for Dyspnea  0- Nothing at all    Perceived Rate of Exertion (Borg)  6-      6 Minute walk- Post Test   6 Minute Walk Post Test  yes    BP (mmHg)  122/90    HR (bpm)  85    02 Sat (%RA)  95 %    Modified Borg Scale for Dyspnea  2- Mild shortness of breath    Perceived Rate of Exertion (Borg)  13- Somewhat hard      6 minute walk test results    Aerobic Endurance Distance Walked  1010    Endurance additional comments  with SPC, no rest breaks      Dynamic Gait Index   Level Surface  Normal    Change in Gait Speed  Mild Impairment    Gait with Horizontal Head Turns  Normal    Gait with Vertical Head Turns  Normal  Gait and Pivot Turn  Normal    Step Over Obstacle  Normal    Step Around Obstacles  Normal    Steps  Mild Impairment    Total Score  22             TA:  Performed floor recovery with use of her own  yoga mat that she had purchased x 2 reps in session.  She was able to do today without having pillows to elevate on and she was able to do at min A level today.  Will revise goal to have her complete at mod I level at end of episode of care.  Pt pleased with progress.               PT Education - 09/22/18 1025    Education Details  safety with floor recovery, progress based on STGs    Person(s) Educated  Patient    Methods  Explanation    Comprehension  Verbalized understanding       PT Short Term Goals - 09/22/18 1025      PT SHORT TERM GOAL #1   Title  Pt will be independent with initial HEP in order to indicate decreased fall risk and improved functional mobility (Target Date: 09/20/18)    Baseline  met per verbal report    Time  5    Period  Weeks    Status  Achieved      PT SHORT TERM GOAL #2   Title  Pt will perform DGI and improve score 3 points from baseline in order to indicate decreased fall risk.      Baseline  15/24 on 12/10 to 22/24 on 12/31    Time  5    Period  Weeks    Status  Achieved      PT SHORT TERM GOAL #3   Title  Pt will improve gait speed to >/=2.62 ft/sec w/ LRAD in order to indicate safe community ambulator.     Baseline  3.50 ft/sec with cane, 3.64 ft/sec without cane on 09/22/18    Time  5    Period  Weeks    Status  Achieved      PT SHORT TERM GOAL #4   Title  Will assess need for AFO/support for LLE and request order as appropriate.      Baseline  deferred at this time, no need     Time  5    Period  Weeks    Status  Deferred      PT SHORT TERM GOAL #5   Title  Will assess 6MWT and improve distance by 150' in order to indicate improved functional endurance.     Baseline  859' on 12/10, 1010' on 12/31    Time  5    Period  Weeks    Status  Achieved      PT SHORT TERM GOAL #6   Title  Pt will perform floor recovery at mod A level in order to indiciate improved ability to get up in case of fall.      Baseline  min A on 09/22/18     Time  5    Period  Weeks    Status  Achieved      PT SHORT TERM GOAL #7   Title  Pt will negotiate up/down curb step with LRAD at mod I level in order to indicate safe community negotiation.     Time  5  Period  Weeks    Status  New        PT Long Term Goals - 09/22/18 1251      PT LONG TERM GOAL #1   Title  Pt will be independent with final HEP in order to indicate decreased fall risk and improved functional mobility.  (Target Date: 10/20/18    Time  9    Period  Weeks    Status  New      PT LONG TERM GOAL #2   Title  Pt will perform 5TSS in <11 secs without UE support and without using backs of legs on seated surface in order to indicate improved BLE strength.      Time  9    Period  Weeks    Status  New      PT LONG TERM GOAL #3   Title  Pt will ambulate x 500' w/ LRAD over unlevel outdoor paved surfaces at mod I level in order to indicate safe community mobility.      Time  9    Period  Weeks    Status  New      PT LONG TERM GOAL #4   Title  Pt will improve DGI by 6 points from baseline in order to indicate decreased fall risk.     Baseline  15/24 to 22/24    Time  9    Period  Weeks    Status  Achieved      PT LONG TERM GOAL #5   Title  Pt will perform floor recovery at mod I level in order to indicate improved ability to get up from floor in case of fall.     Time  9    Period  Weeks    Status  Revised      PT LONG TERM GOAL #6   Title  Pt will improve 6MWT by 300' from baseline in order to indicate improved functional endurance.     Time  9    Period  Weeks    Status  New            Plan - 09/22/18 1249    Clinical Impression Statement  Skilled session focused on assessment of STGs.  Pt has met 4/6 STGs, with PT deferring AFO/support goal for LLE as she does not need at this time.  Will assess curb step at next session with SPC.  Pt making excellent progress and has even met LTG for DGI and floor transfer, therefore will assess FGA and update  floor recovery goal to mod I.     Rehab Potential  Good    Clinical Impairments Affecting Rehab Potential  neuropathy, uncertain of reason for adipose tissue in BLEs, chronic small vessel disease    PT Frequency  2x / week    PT Duration  8 weeks    PT Treatment/Interventions  ADLs/Self Care Home Management;Aquatic Therapy;DME Instruction;Gait training;Stair training;Functional mobility training;Therapeutic activities;Therapeutic exercise;Balance training;Neuromuscular re-education;Patient/family education;Orthotic Fit/Training;Scar mobilization;Passive range of motion;Dry needling;Vestibular    PT Next Visit Plan  FGA,  UE strength, balance, LE strengthening (esp hip), endurance.     PT Home Exercise Plan  [id number]    Consulted and Agree with Plan of Care  Patient       Patient will benefit from skilled therapeutic intervention in order to improve the following deficits and impairments:  Decreased activity tolerance, Decreased balance, Decreased coordination, Decreased endurance, Decreased knowledge of use of DME, Decreased mobility, Decreased range  of motion, Decreased strength, Difficulty walking, Dizziness, Hypomobility, Impaired flexibility, Impaired perceived functional ability, Impaired sensation, Impaired UE functional use, Postural dysfunction  Visit Diagnosis: Unsteadiness on feet  Muscle weakness (generalized)  Other abnormalities of gait and mobility  Repeated falls     Problem List Patient Active Problem List   Diagnosis Date Noted  . Gait abnormality 07/10/2018  . S/P reverse total shoulder arthroplasty, 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  . Persistent atrial fibrillation   . Bilateral ureteral calculi 11/17/2017  . Atrial fibrillation with RVR ([place]) 09/15/2017  . Atrial flutter ([place]) 07/17/2017  . Port catheter in place 04/02/2017  . Non-Hodgkin lymphoma, unspecified, intrathoracic lymph nodes ([place]) 02/13/2017  . Mediastinal mass 02/06/2017  . 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 Abnormal chest x-ray   . Lung mass 02/03/2017  . 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  . OSA (obstructive sleep apnea) 02/03/2015  . History of renal calculi 11/16/2014  . Renal calculi 11/16/2014  . Family history of colon cancer 10/26/2014  . Mechanical complication due to cardiac pacemaker pulse generator 11/06/2013  . Dyspnea 05/12/2013  . Hypothyroidism 04/21/2013  . Pleural effusion 04/19/2013  . Long term (current) use of anticoagulants 12/20/2010  . EPIDERMOID CYST 08/22/2010  . Chronic diastolic heart failure ([place]) 08/06/2010  . SYNCOPE AND COLLAPSE 07/27/2010  . OSTEOARTHRITIS, KNEE, RIGHT 03/15/2010  . Morbid obesity ([place]) 12/07/2009  . NONSPEC ELEVATION OF LEVELS OF TRANSAMINASE/LDH 09/08/2009  . BREAST CANCER, HX OF 07/25/2009  . COLONIC POLYPS, HX OF 07/25/2009  . TUBULOVILLOUS ADENOMA, COLON 04/29/2008  . HYPERGLYCEMIA, FASTING 04/29/2008  . HYPERLIPIDEMIA 06/22/2007  . CARCINOMA, THYROID GLAND, HX OF 06/22/2007    Cameron Sprang, PT, MPT [facility] [address] [place], [place], [address] Phone: [phone]   Fax:  [phone] 09/22/18, 12:56 PM  Name: FLOETTA BRICKEY MRN: [id number] Date of Birth: [birth year]

## 2018-09-24 ENCOUNTER — Encounter: Payer: Self-pay | Admitting: Rehabilitation

## 2018-09-24 ENCOUNTER — Ambulatory Visit: Payer: Medicare Other | Attending: Neurology | Admitting: Rehabilitation

## 2018-09-24 DIAGNOSIS — R2689 Other abnormalities of gait and mobility: Secondary | ICD-10-CM

## 2018-09-24 DIAGNOSIS — R293 Abnormal posture: Secondary | ICD-10-CM | POA: Diagnosis not present

## 2018-09-24 DIAGNOSIS — R2681 Unsteadiness on feet: Secondary | ICD-10-CM | POA: Insufficient documentation

## 2018-09-24 DIAGNOSIS — R42 Dizziness and giddiness: Secondary | ICD-10-CM | POA: Diagnosis not present

## 2018-09-24 DIAGNOSIS — M6281 Muscle weakness (generalized): Secondary | ICD-10-CM | POA: Diagnosis not present

## 2018-09-24 IMAGING — CT CT BIOPSY AND ASPIRATION BONE MARROW
1 of 2 series · 13 of 16 positions shown, 17 images · non-contrast
Comparison: none

INDICATION: 70-year-old female with a history of non-Hodgkin's lymphoma

[Series 2: i-spiral 5.0 b40f · axial · 0.93mm/px · z∈[-193,-63]mm · 13 of 45 slices shown, 17 images]
[im 4/45  soft-tissue]
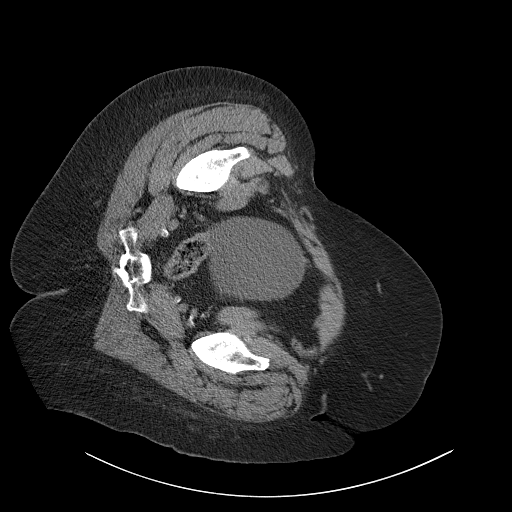
[im 4/45  bone]
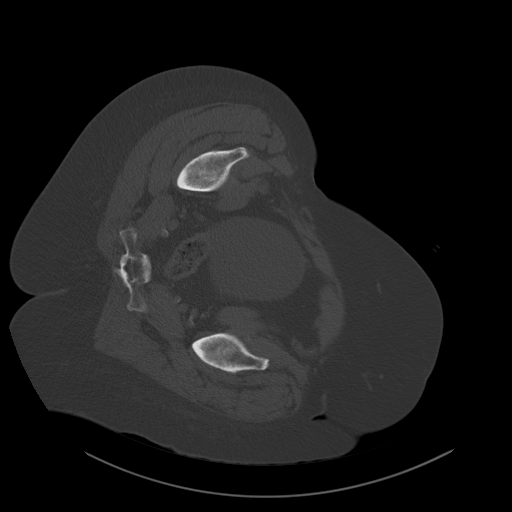
[im 7/45  soft-tissue]
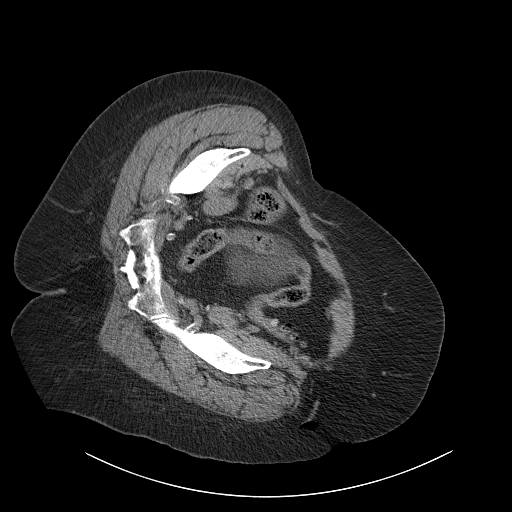
[im 10/45  soft-tissue]
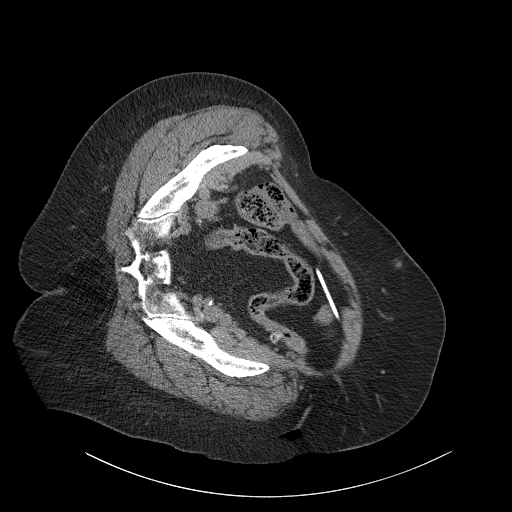
[im 13/45  soft-tissue]
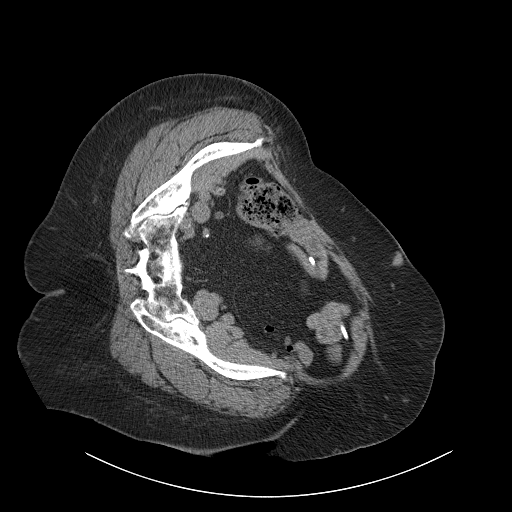
[im 16/45  soft-tissue]
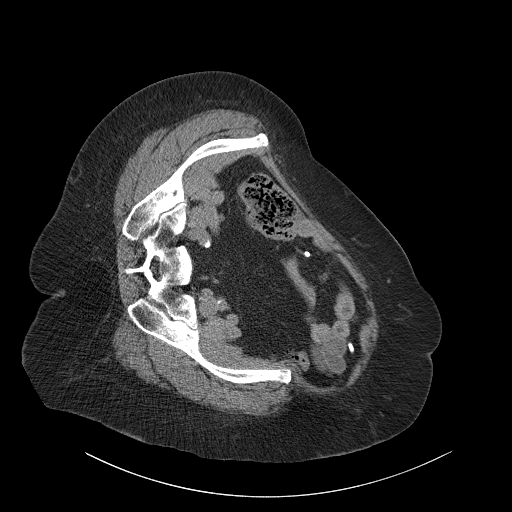
[im 16/45  bone]
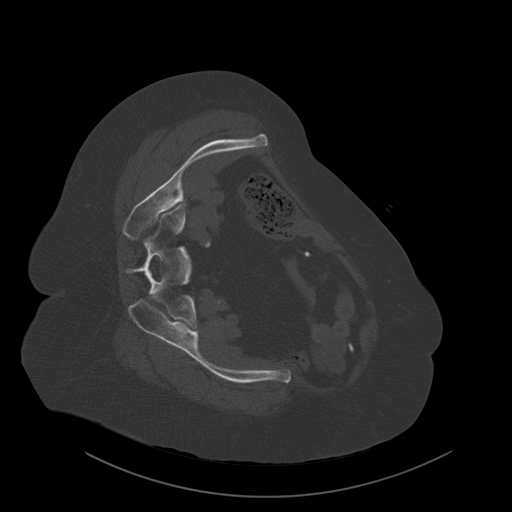
[im 19/45  soft-tissue]
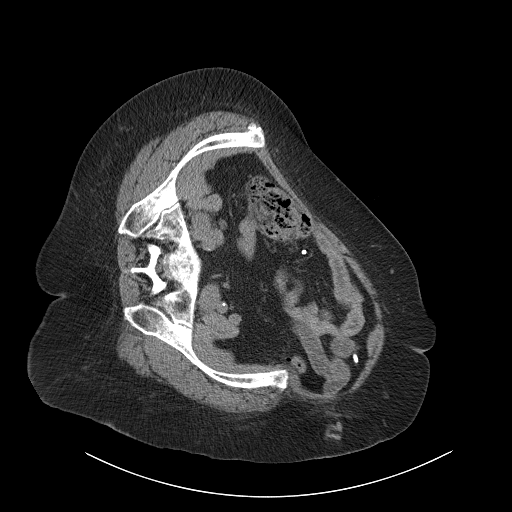
[im 23/45  soft-tissue]
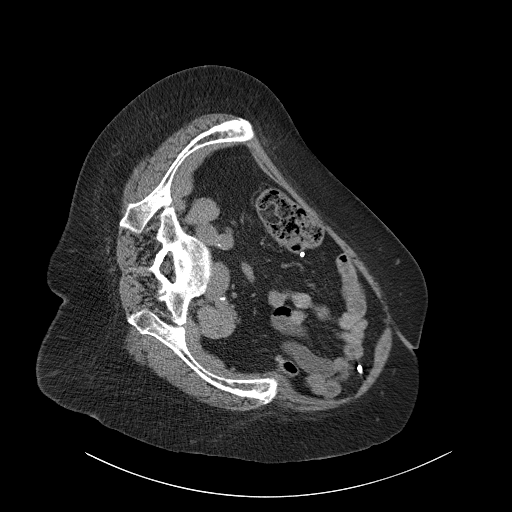
[im 26/45  soft-tissue]
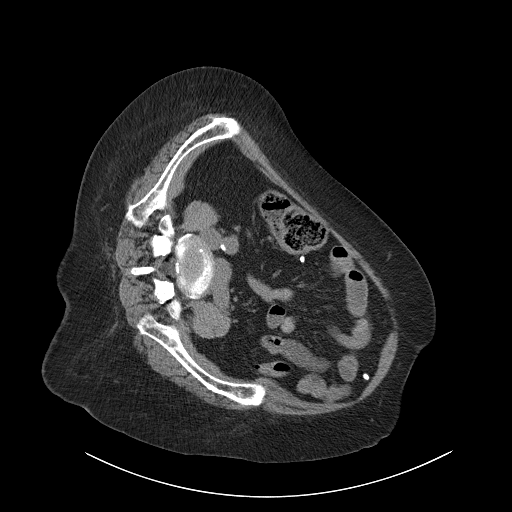
[im 29/45  soft-tissue]
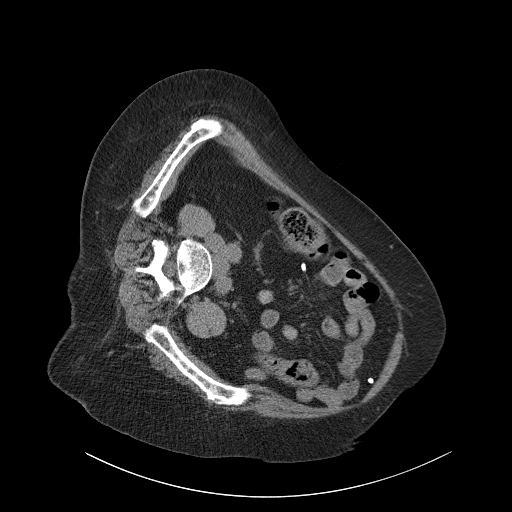
[im 29/45  bone]
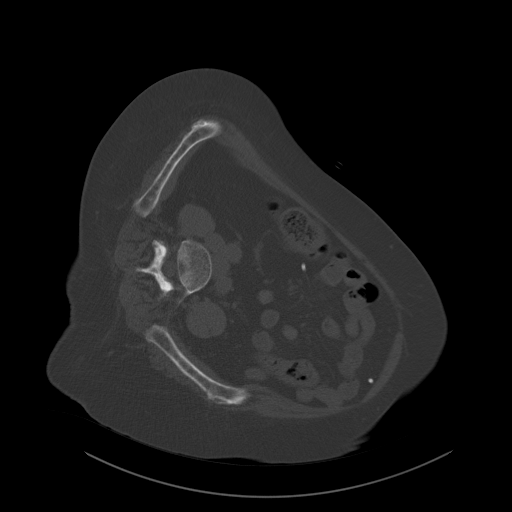
[im 32/45  soft-tissue]
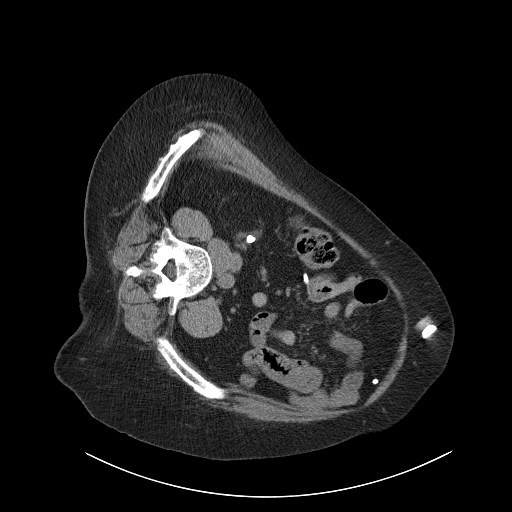
[im 35/45  soft-tissue]
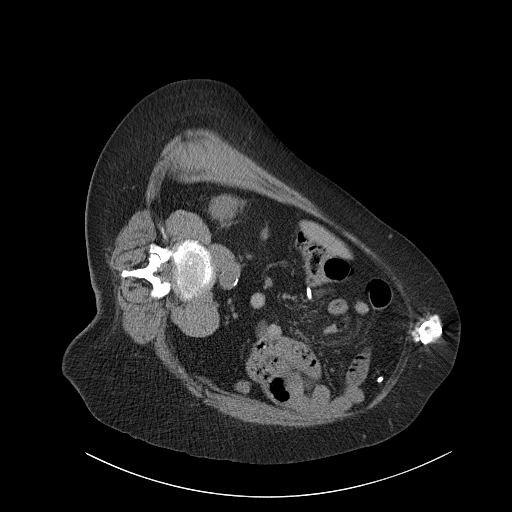
[im 38/45  soft-tissue]
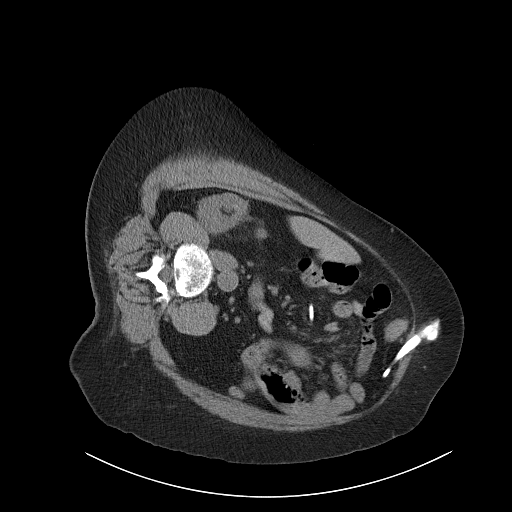
[im 41/45  soft-tissue]
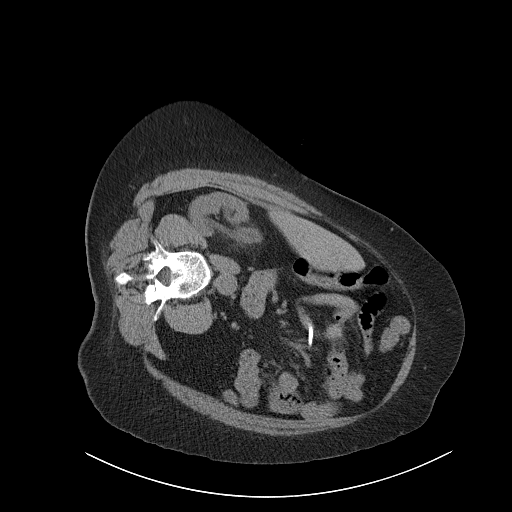
[im 41/45  bone]
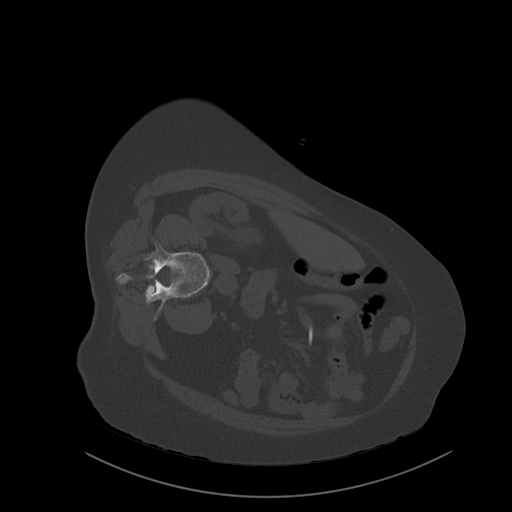

[13 of 16 positions shown; findings below may reference images not displayed]

EXAM:
CT BIOPSY; CT BONE MARROW BIOPSY AND ASPIRATION

MEDICATIONS:
None.

ANESTHESIA/SEDATION:
Moderate (conscious) sedation was employed during this procedure. A
total of Versed 3.0 mg and Fentanyl 100 mcg was administered
intravenously.

Moderate Sedation Time: 14 minutes. The patient's level of
consciousness and vital signs were monitored continuously by
radiology nursing throughout the procedure under my direct
supervision.

FLUOROSCOPY TIME:  CT

COMPLICATIONS:
None

PROCEDURE:
The procedure risks, benefits, and alternatives were explained to
the patient. Questions regarding the procedure were encouraged and
answered. The patient understands and consents to the procedure.

Scout CT of the pelvis was performed for surgical planning purposes.

The posterior pelvis was prepped with Betadinein a sterile fashion,
and a sterile drape was applied covering the operative field. A
sterile gown and sterile gloves were used for the procedure. Local
anesthesia was provided with 1% Lidocaine.

We targeted the left posterior iliac bone for biopsy. The skin and
subcutaneous tissues were infiltrated with 1% lidocaine without
epinephrine. A small stab incision was made with an 11 blade
scalpel, and an 11 gauge Darwin needle was advanced with CT guidance
to the posterior cortex. Manual forced was used to advance the
needle through the posterior cortex and the stylet was removed. A
bone marrow aspirate was retrieved and passed to a cytotechnologist
in the room. The Darwin needle was then advanced without the stylet
for a core biopsy. The core biopsy was retrieved and also passed to
a cytotechnologist.

Manual pressure was used for hemostasis and a sterile dressing was
placed.

No complications were encountered no significant blood loss was
encountered.

Patient tolerated the procedure well and remained hemodynamically
stable throughout.
IMPRESSION: Status post CT-guided bone marrow biopsy, with tissue specimen sent
to pathology for complete histopathologic analysis

## 2018-09-24 NOTE — Patient Instructions (Signed)
Tip Card 1.The goal of habituation training is to assist in decreasing symptoms of vertigo, dizziness, or nausea provoked by specific head and body motions. 2.These exercises may initially increase symptoms; however, be persistent and work through symptoms. With repetition and time, the exercises will assist in reducing or eliminating symptoms. 3.Exercises should be stopped and discussed with the therapist if you experience any of the following: - Sudden change or fluctuation in hearing - New onset of ringing in the ears, or increase in current intensity - Any fluid discharge from the ear - Severe pain in neck or back - Extreme nausea  Copyright  VHI. All rights reserved.  Sit to Side-Lying   Sit on edge of bed. Lie down onto the right side and hold until dizziness stops, plus 20 seconds.  Return to sitting and wait until dizziness stops, plus 20 seconds.  Repeat to the left side. Repeat sequence 2 times per session. Do 2 sessions per day. In 2-3 days if you can tolerate it, work your way up to 5 times each direction.   Special Instructions for Kayla Harrison:  You are going to sit up in bed for 20 mins following exercise.  So make sure you use restroom before doing exercises.  Have water, phone, remote, etc or anything you may want for those 20 mins near you.

## 2018-09-24 NOTE — Therapy (Signed)
[facility] [address] [place], [place], [address] Phone: [phone]   Fax:  [phone]  Physical Therapy Treatment  Patient Details  Name: Kayla Harrison MRN: [id number] Date of Birth: [birth year] Referring Provider (PT): Sarina Ill, MD   Encounter Date: 09/24/2018  PT End of Session - 09/24/18 0936    Visit Number  9    Number of Visits  17    Date for PT Re-Evaluation  11/12/18    Authorization Type  Medicare and [employer]-needs 10th visit progress note    PT Start Time  0932    PT Stop Time  1012    PT Time Calculation (min)  40 min    Activity Tolerance  Patient tolerated treatment well    Behavior During Therapy  [facility] for tasks assessed/performed       Past Medical History:  Diagnosis Date  . Anemia   . Arthritis    osteoarthritis - knees and right shoulder  . Atrial fibrillation [facility])    ablation- 2x's-- 1st time- [facility], 2nd event at [facility] in 2008. Convergent ablation at [facility] 6/14  . Atrial fibrillation ([place])   . Blood transfusion without reported diagnosis   . Breast cancer ([place])    Dr Margot Chimes, total thyroidectomy- 1999- for cancer  . 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 Chronic bilateral pleural effusions   . Colon polyp    Dr Earlean Shawl  . Complete heart block ([place])   . Complication of anesthesia    Ketamine produces LSD reaction, bright colored nightmarish experience   . Dyslipidemia   . Dyspnea   . Endometriosis   . Fibroids   . H/O pleural effusion    s/p thoracentesis w 3246m withdrawn  . Hepatitis    [place] ?hepatitis   . History of dysphagia    due to radiation therapy  . History of hiatal hernia    small noted on PET scan  . History of kidney stones   . Hx of thyroid cancer    Dr SForde Dandy . Hyperlipidemia   . Hypertension   . Hypothyroidism   . Lung cancer, [place] lobe ([place] 01[place]   radiation RX completed 03/04/17; will start chemo 6/27, pt  unaware of lung cancer  . Morbid obesity ([place]    Status post lap band surgery  . Nephrolithiasis   . Non Hodgkin's lymphoma ([place]    on chemotherapy  . Persistent atrial fibrillation    a. s/p PVI 2008 b. s/p convergent ablation [id number] by bradycardia requiring pacemaker implant  . Presence of permanent cardiac pacemaker   . Rotator cuff tear    Right  . Sinus node dysfunction ([place]    Complicating convergent ablation 6/14  . Stroke ([facility]    [place]  . SVC syndrome    with lung mass and non hodgkins lymphoma  . Thyroid cancer ([place] 2000    Past Surgical History:  Procedure Laterality Date  . ABDOMINAL HYSTERECTOMY  1983  . afib ablation     a. 2008 PVI b. 2014 convergent ablation  . APPENDECTOMY    . BONE MARROW BIOPSY  02/21/2017  . BREAST LUMPECTOMY Left 2010  . bso  1998  . CARDIAC CATHETERIZATION     2015- negative  . CARDIOVERSION  10/09/2012   Procedure: CARDIOVERSION;  Surgeon: JMinus Breeding MD;  [place]  Service: Cardiovascular;  Laterality: N/A;  . CARDIOVERSION  10/09/2012  Procedure: CARDIOVERSION;  Surgeon: Minus Breeding, MD;  Location: [facility] ENDOSCOPY;  Service: Cardiovascular;  Laterality: N/A;  [name] gave the ok to add pt to the add on , but we must check to find out if the can add pt on at [phone] ( [phone])  . CARDIOVERSION N/A 11/20/2012   Procedure: CARDIOVERSION;  Surgeon: Fay Records, MD;  Location: [facility] ENDOSCOPY;  Service: Cardiovascular;  Laterality: N/A;  . CARDIOVERSION N/A 07/18/2017   Procedure: CARDIOVERSION;  Surgeon: Thayer Headings, MD;  Location: [facility] ENDOSCOPY;  Service: Cardiovascular;  Laterality: N/A;  . CARDIOVERSION N/A 10/03/2017   Procedure: CARDIOVERSION;  Surgeon: Sanda Klein, MD;  Location: [facility] ENDOSCOPY;  Service: Cardiovascular;  Laterality: N/A;  . CARDIOVERSION N/A 01/07/2018   Procedure: CARDIOVERSION;  Surgeon: Acie Fredrickson Wonda Cheng, MD;  Location: [place];  Service: Cardiovascular;  Laterality: N/A;  .  CHOLECYSTECTOMY    . COLONOSCOPY W/ POLYPECTOMY     Dr Earlean Shawl  . CYSTOSCOPY N/A 02/06/2015   Procedure: CYSTOSCOPY;  Surgeon: Kathie Rhodes, MD;  Location: [facility];  Service: Urology;  Laterality: N/A;  . CYSTOSCOPY W/ RETROGRADES Left 11/17/2017   Procedure: CYSTOSCOPY WITH RETROGRADE /PYELOGRAM/;  Surgeon: Kathie Rhodes, MD;  Location: [facility];  Service: Urology;  Laterality: Left;  . CYSTOSCOPY WITH RETROGRADE PYELOGRAM, URETEROSCOPY AND STENT PLACEMENT Right 02/06/2015   Procedure: RETROGRADE PYELOGRAM, RIGHT URETEROSCOPY STENT PLACEMENT;  Surgeon: Kathie Rhodes, MD;  Location: [facility];  Service: Urology;  Laterality: Right;  . CYSTOSCOPY WITH RETROGRADE PYELOGRAM, URETEROSCOPY AND STENT PLACEMENT Right 03/07/2017   Procedure: CYSTOSCOPY WITH RIGHT RETROGRADE PYELOGRAM,RIGHT  URETEROSCOPYLASER LITHOTRIPSY  AND STENT PLACEMENT AND STONE BASKETRY;  Surgeon: Kathie Rhodes, MD;  Location: [place];  Service: Urology;  Laterality: Right;  . EYE SURGERY     cataract surgery  . fatty mass removal  1999   pubic area  . HOLMIUM LASER APPLICATION N/A 9/45/0388   Procedure: HOLMIUM LASER APPLICATION;  Surgeon: Kathie Rhodes, MD;  Location: [facility];  Service: Urology;  Laterality: N/A;  . HOLMIUM LASER APPLICATION Right 05/20/33   Procedure: HOLMIUM LASER APPLICATION;  Surgeon: Kathie Rhodes, MD;  Location: [facility];  Service: Urology;  Laterality: Right;  . HOLMIUM LASER APPLICATION Left 06/09/9149   Procedure: HOLMIUM LASER APPLICATION;  Surgeon: Kathie Rhodes, MD;  [facility];  Service: Urology;  Laterality: Left;  . IR FLUORO GUIDE PORT INSERTION RIGHT  02/24/2017  . IR NEPHROSTOMY PLACEMENT RIGHT  11/17/2017  . IR PATIENT EVAL TECH 0-60 MINS  03/11/2017  . IR REMOVAL TUN ACCESS W/ PORT W/O FL MOD SED  04/20/2018  . IR US GUIDE VASC ACCESS RIGHT  02/24/2017  . KNEE ARTHROSCOPY     bilateral  . LAPAROSCOPIC GASTRIC BANDING  07/10/2010  . LAPAROSCOPIC GASTRIC BANDING      Laparoscopic adjustable banding APS System with posterior hiatal hernia, 2 suture.  Marland Kitchen LAPAROTOMY     for ruptured ovary and ovarian artery   . NEPHROLITHOTOMY Right 11/17/2017   Procedure: NEPHROLITHOTOMY PERCUTANEOUS;  Surgeon: Kathie Rhodes, MD;  [facility];  Service: Urology;  Laterality: Right;  . PACEMAKER INSERTION  03/10/2013   MDT dual chamber PPM  . POCKET REVISION N/A 12/08/2013   Procedure: POCKET REVISION;  Surgeon: Deboraha Sprang, MD;  Location: [facility] CATH LAB;  Service: Cardiovascular;  Laterality: N/A;  . PORTA CATH INSERTION    . REVERSE SHOULDER ARTHROPLASTY Right 05/14/2018   Procedure: RIGHT REVERSE SHOULDER ARTHROPLASTY;  Surgeon: Tania Ade, MD;  Location: [place];  Service: Orthopedics;  Laterality: Right;  . REVERSE SHOULDER REPLACEMENT Right 05/14/2018  . TEE WITH CARDIOVERSION  09/22/2017  . TEE WITHOUT CARDIOVERSION N/A 10/03/2017   Procedure: TRANSESOPHAGEAL ECHOCARDIOGRAM (TEE);  Surgeon: Sanda Klein, MD;  Location: [place];  Service: Cardiovascular;  Laterality: N/A;  . THYROIDECTOMY  1998   Dr Margot Chimes  . TONSILLECTOMY    . TOTAL KNEE ARTHROPLASTY  04/13/2012   Procedure: TOTAL KNEE ARTHROPLASTY;  Surgeon: Rudean Haskell, MD;  Location: [place];  Service: Orthopedics;  Laterality: Right;  Marland Kitchen VIDEO BRONCHOSCOPY WITH ENDOBRONCHIAL ULTRASOUND N/A 02/07/2017   Procedure: VIDEO BRONCHOSCOPY WITH ENDOBRONCHIAL ULTRASOUND;  Surgeon: Marshell Garfinkel, MD;  Location: [place];  Service: Pulmonary;  Laterality: N/A;    There were no vitals filed for this visit.  Subjective Assessment - 09/24/18 0936    Subjective  No changes since last session.     Pertinent History  lymphoma (had chemotherapy), vertigo, small vessel disease, arthritis (has had R knee and reverse total shoulder on R)    Limitations  Walking;House hold activities    Patient Stated Goals  "I want to be able to walk in a normal fashion"     Currently in Pain?  No/denies              Vestibular Assessment - 09/24/18 0940      Symptom Behavior   Type of Dizziness  Spinning    Frequency of Dizziness  when getting in/out of bed    Duration of Dizziness  short (today) reports lasted 3-4 mins at home)    Aggravating Factors  Turning head sideways;Supine to sit    Relieving Factors  Slow movements;Closing eyes      Occulomotor Exam   Occulomotor Alignment  Normal    Spontaneous  Absent      Vestibulo-Occular Reflex   VOR 1 Head Only (x 1 viewing)  Note L retinal slip when performing x 1 viewing.       Positional Testing   Dix-Hallpike  Dix-Hallpike Left    Sidelying Test  Sidelying Right;Sidelying Left      Dix-Hallpike Left   Dix-Hallpike Left Duration  5 secs    Dix-Hallpike Left Symptoms  Upbeat Nystagmus   difficult to discern rotary component     Sidelying Right   Sidelying Right Duration  none    Sidelying Right Symptoms  No nystagmus      Sidelying Left   Sidelying Left Duration  3-4 secs    Sidelying Left Symptoms  Other (comment)   pt trying to visually target, unable to discern direction              [facility] Adult PT Treatment/Exercise - 09/24/18 9030      Therapeutic Activites    Therapeutic Activities  Other Therapeutic Activities    Other Therapeutic Activities  Performed floor transfer x 2 reps during session to her yoga mat at min/guard to light min A level.  Pt continues to make improvement with task.  Had her move yoga mat while supported with LUE so that when she stepped closer to sofa at home, she wouldn't trip over yoga mat.       Vestibular Treatment/Exercise - 09/24/18 0940      Vestibular Treatment/Exercise   Vestibular Treatment Provided  Habituation    Habituation Exercises  Nestor Lewandowsky      Nestor Lewandowsky   Number of Reps   2    Symptom Description   Did  not perform during session as she felt poorly following testing/manuever.  Provided for HEP, see pt instruction            PT Education -  09/24/18 0936    Education Details  vertigo treatment     Person(s) Educated  Patient    Methods  Explanation    Comprehension  Verbalized understanding       PT Short Term Goals - 09/22/18 1025      PT SHORT TERM GOAL #1   Title  Pt will be independent with initial HEP in order to indicate decreased fall risk and improved functional mobility (Target Date: 09/20/18)    Baseline  met per verbal report    Time  5    Period  Weeks    Status  Achieved      PT SHORT TERM GOAL #2   Title  Pt will perform DGI and improve score 3 points from baseline in order to indicate decreased fall risk.      Baseline  15/24 on 12/10 to 22/24 on 12/31    Time  5    Period  Weeks    Status  Achieved      PT SHORT TERM GOAL #3   Title  Pt will improve gait speed to >/=2.62 ft/sec w/ LRAD in order to indicate safe community ambulator.     Baseline  3.50 ft/sec with cane, 3.64 ft/sec without cane on 09/22/18    Time  5    Period  Weeks    Status  Achieved      PT SHORT TERM GOAL #4   Title  Will assess need for AFO/support for LLE and request order as appropriate.      Baseline  deferred at this time, no need     Time  5    Period  Weeks    Status  Deferred      PT SHORT TERM GOAL #5   Title  Will assess 6MWT and improve distance by 150' in order to indicate improved functional endurance.     Baseline  859' on 12/10, 1010' on 12/31    Time  5    Period  Weeks    Status  Achieved      PT SHORT TERM GOAL #6   Title  Pt will perform floor recovery at mod A level in order to indiciate improved ability to get up in case of fall.      Baseline  min A on 09/22/18    Time  5    Period  Weeks    Status  Achieved      PT SHORT TERM GOAL #7   Title  Pt will negotiate up/down curb step with LRAD at mod I level in order to indicate safe community negotiation.     Time  5    Period  Weeks    Status  New        PT Long Term Goals - 09/22/18 1251      PT LONG TERM GOAL #1   Title  Pt will be  independent with final HEP in order to indicate decreased fall risk and improved functional mobility.  (Target Date: 10/20/18    Time  9    Period  Weeks    Status  New      PT LONG TERM GOAL #2   Title  Pt will perform 5TSS in <11 secs without UE support and without using backs of legs  on seated surface in order to indicate improved BLE strength.      Time  9    Period  Weeks    Status  New      PT LONG TERM GOAL #3   Title  Pt will ambulate x 500' w/ LRAD over unlevel outdoor paved surfaces at mod I level in order to indicate safe community mobility.      Time  9    Period  Weeks    Status  New      PT LONG TERM GOAL #4   Title  Pt will improve DGI by 6 points from baseline in order to indicate decreased fall risk.     Baseline  15/24 to 22/24    Time  9    Period  Weeks    Status  Achieved      PT LONG TERM GOAL #5   Title  Pt will perform floor recovery at mod I level in order to indicate improved ability to get up from floor in case of fall.     Time  9    Period  Weeks    Status  Revised      PT LONG TERM GOAL #6   Title  Pt will improve 6MWT by 300' from baseline in order to indicate improved functional endurance.     Time  9    Period  Weeks    Status  New            Plan - 09/24/18 0936    Clinical Impression Statement  Skilled session focused on floor recovery per pt request.  Pt also reports instance of vertigo this morning when getting in/out of bed.  Assessed via sidelying test and note positive for L BPPV.  Therefore took pt through [place] which was also positive and completed L Epley manuever.  Pt felt very dizzy and lightheaded following treatment and needed to rest in clinic before leaving.  Rehab tech assisted pt to car for safety.   Will continue to follow up at next session.     Rehab Potential  Good    Clinical Impairments Affecting Rehab Potential  neuropathy, uncertain of reason for adipose tissue in BLEs, chronic small vessel disease    PT  Frequency  2x / week    PT Duration  8 weeks    PT Treatment/Interventions  ADLs/Self Care Home Management;Aquatic Therapy;DME Instruction;Gait training;Stair training;Functional mobility training;Therapeutic activities;Therapeutic exercise;Balance training;Neuromuscular re-education;Patient/family education;Orthotic Fit/Training;Scar mobilization;Passive range of motion;Dry needling;Vestibular    PT Next Visit Plan  Re-assess L dix hallpike, give gaze stabilization, FGA,  UE strength, balance, LE strengthening (esp hip), endurance.     PT Home Exercise Plan  [id number]    Consulted and Agree with Plan of Care  Patient       Patient will benefit from skilled therapeutic intervention in order to improve the following deficits and impairments:  Decreased activity tolerance, Decreased balance, Decreased coordination, Decreased endurance, Decreased knowledge of use of DME, Decreased mobility, Decreased range of motion, Decreased strength, Difficulty walking, Dizziness, Hypomobility, Impaired flexibility, Impaired perceived functional ability, Impaired sensation, Impaired UE functional use, Postural dysfunction  Visit Diagnosis: Unsteadiness on feet  Muscle weakness (generalized)  Other abnormalities of gait and mobility  Dizziness and giddiness     Problem List Patient Active Problem List   Diagnosis Date Noted  . Gait abnormality 07/10/2018  . S/P reverse total shoulder arthroplasty, 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  . Persistent atrial fibrillation   .  Bilateral ureteral calculi 11/17/2017  . Atrial fibrillation with RVR ([place]) 09/15/2017  . Atrial flutter ([place]) 07/17/2017  . Port catheter in place 04/02/2017  . Non-Hodgkin lymphoma, unspecified, intrathoracic lymph nodes ([place]) 02/13/2017  . Mediastinal mass 02/06/2017  . 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 Abnormal chest x-ray   . Lung mass 02/03/2017  . 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  . OSA (obstructive sleep apnea)  02/03/2015  . History of renal calculi 11/16/2014  . Renal calculi 11/16/2014  . Family history of colon cancer 10/26/2014  . Mechanical complication due to cardiac pacemaker pulse generator 11/06/2013  . Dyspnea 05/12/2013  . Hypothyroidism 04/21/2013  . Pleural effusion 04/19/2013  . Long term (current) use of anticoagulants 12/20/2010  . EPIDERMOID CYST 08/22/2010  . Chronic diastolic heart failure ([place]) 08/06/2010  . SYNCOPE AND COLLAPSE 07/27/2010  . OSTEOARTHRITIS, KNEE, RIGHT 03/15/2010  . Morbid obesity ([place]) 12/07/2009  . NONSPEC ELEVATION OF LEVELS OF TRANSAMINASE/LDH 09/08/2009  . BREAST CANCER, HX OF 07/25/2009  . COLONIC POLYPS, HX OF 07/25/2009  . TUBULOVILLOUS ADENOMA, COLON 04/29/2008  . HYPERGLYCEMIA, FASTING 04/29/2008  . HYPERLIPIDEMIA 06/22/2007  . CARCINOMA, THYROID GLAND, HX OF 06/22/2007   Cameron Sprang, PT, MPT [facility] [address] [place], [place], [address] Phone: [phone]   Fax:  [phone] 09/24/18, 12:37 PM  Name: Kayla Harrison MRN: [id number] Date of Birth: 03/26/1946

## 2018-09-29 ENCOUNTER — Ambulatory Visit: Payer: Medicare Other | Admitting: Rehabilitation

## 2018-09-29 ENCOUNTER — Encounter: Payer: Self-pay | Admitting: Rehabilitation

## 2018-09-29 DIAGNOSIS — R2689 Other abnormalities of gait and mobility: Secondary | ICD-10-CM | POA: Diagnosis not present

## 2018-09-29 DIAGNOSIS — R2681 Unsteadiness on feet: Secondary | ICD-10-CM

## 2018-09-29 DIAGNOSIS — R293 Abnormal posture: Secondary | ICD-10-CM | POA: Diagnosis not present

## 2018-09-29 DIAGNOSIS — M6281 Muscle weakness (generalized): Secondary | ICD-10-CM | POA: Diagnosis not present

## 2018-09-29 DIAGNOSIS — R42 Dizziness and giddiness: Secondary | ICD-10-CM

## 2018-09-29 NOTE — Patient Instructions (Signed)
Copyright  VHI. All rights reserved.  Gaze Stabilization: Tip Card 1.Target must remain in focus, not blurry, and appear stationary while head is in motion. 2.Perform exercises with small head movements (45 to either side of midline). 3.Increase speed of head motion so long as target is in focus. 4.If you wear eyeglasses, be sure you can see target through lens (therapist will give specific instructions for bifocal / progressive lenses). 5.These exercises may provoke dizziness or nausea. Work through these symptoms. If too dizzy, slow head movement slightly. Rest between each exercise. 6.Exercises demand concentration; avoid distractions. 7.For safety, perform standing exercises close to a counter, wall, corner, or next to someone.  Copyright  VHI. All rights reserved.  Gaze Stabilization: Sitting    Feet shoulder width apart, keeping eyes on target on wall 3 feet away, tilt head down slightly and move head side to side for 15 seconds. Repeat while moving head up and down for 15 seconds. Do 2 sessions per day.   Copyright  VHI. All rights reserved.

## 2018-09-29 NOTE — Therapy (Signed)
[facility] [address] [place], [place], [address] Phone: [phone]   Fax:  [phone]  Physical Therapy Treatment and Progress Note  Patient Details  Name: Kayla Harrison MRN: [id number] Date of Birth: Apr 21, 1946 Referring Provider (PT): Sarina Ill, MD   Encounter Date: 09/29/2018  PT End of Session - 09/29/18 1124    Visit Number  10    Number of Visits  17    Date for PT Re-Evaluation  11/12/18    Authorization Type  Medicare and [employer]-needs 10th visit progress note    PT Start Time  0933    PT Stop Time  1018    PT Time Calculation (min)  45 min    Activity Tolerance  Patient tolerated treatment well    Behavior During Therapy  [facility] for tasks assessed/performed       Past Medical History:  Diagnosis Date  . Anemia   . Arthritis    osteoarthritis - knees and right shoulder  . Atrial fibrillation [facility])    ablation- 2x's-- 1st time- [facility], 2nd event at [facility] in 2008. Convergent ablation at [facility] 6/14  . Atrial fibrillation ([place])   . Blood transfusion without reported diagnosis   . Breast cancer ([place])    Dr Margot Chimes, total thyroidectomy- 1999- for cancer  . 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 Chronic bilateral pleural effusions   . Colon polyp    Dr Earlean Shawl  . Complete heart block ([place])   . Complication of anesthesia    Ketamine produces LSD reaction, bright colored nightmarish experience   . Dyslipidemia   . Dyspnea   . Endometriosis   . Fibroids   . H/O pleural effusion    s/p thoracentesis w 3258m withdrawn  . Hepatitis    [place] ?hepatitis   . History of dysphagia    due to radiation therapy  . History of hiatal hernia    small noted on PET scan  . History of kidney stones   . Hx of thyroid cancer    Dr SForde Dandy . Hyperlipidemia   . Hypertension   . Hypothyroidism   . Lung cancer, [place] lobe ([place] 01[place]   radiation RX completed 03/04/17; will start  chemo 6/27, pt unaware of lung cancer  . Morbid obesity ([place]    Status post lap band surgery  . Nephrolithiasis   . Non Hodgkin's lymphoma ([place]    on chemotherapy  . Persistent atrial fibrillation    a. s/p PVI 2008 b. s/p convergent ablation [id number] by bradycardia requiring pacemaker implant  . Presence of permanent cardiac pacemaker   . Rotator cuff tear    Right  . Sinus node dysfunction ([place]    Complicating convergent ablation 6/14  . St[place]    [place]  . SVC syndrome    with lung mass and non hodgkins lymphoma  . Thyroid cancer ([place] 2000    Past Surgical History:  Procedure Laterality Date  . ABDOMINAL HYSTERECTOMY  1983  . afib ablation     a. 2008 PVI b. 2014 convergent ablation  . APPENDECTOMY    . BONE MARROW BIOPSY  02/21/2017  . BREAST LUMPECTOMY Left 2010  . bso  1998  . CARDIAC CATHETERIZATION     2015- negative  . CARDIOVERSION  10/09/2012   Procedure: CARDIOVERSION;  Surgeon: JMinus Breeding MD;  [place]  Service: Cardiovascular;  Laterality: N/A;  . CARDIOVERSION  10/09/2012   Procedure: CARDIOVERSION;  Surgeon: Minus Breeding, MD;  Location: [facility] ENDOSCOPY;  Service: Cardiovascular;  Laterality: N/A;  [name] gave the ok to add pt to the add on , but we must check to find out if the can add pt on at [phone] ( [phone])  . CARDIOVERSION N/A 11/20/2012   Procedure: CARDIOVERSION;  Surgeon: Fay Records, MD;  Location: [facility] ENDOSCOPY;  Service: Cardiovascular;  Laterality: N/A;  . CARDIOVERSION N/A 07/18/2017   Procedure: CARDIOVERSION;  Surgeon: Thayer Headings, MD;  Location: [facility] ENDOSCOPY;  Service: Cardiovascular;  Laterality: N/A;  . CARDIOVERSION N/A 10/03/2017   Procedure: CARDIOVERSION;  Surgeon: Sanda Klein, MD;  Location: [facility] ENDOSCOPY;  Service: Cardiovascular;  Laterality: N/A;  . CARDIOVERSION N/A 01/07/2018   Procedure: CARDIOVERSION;  Surgeon: Acie Fredrickson Wonda Cheng, MD;  Location: [place];  Service: Cardiovascular;   Laterality: N/A;  . CHOLECYSTECTOMY    . COLONOSCOPY W/ POLYPECTOMY     Dr Earlean Shawl  . CYSTOSCOPY N/A 02/06/2015   Procedure: CYSTOSCOPY;  Surgeon: Kathie Rhodes, MD;  Location: [facility];  Service: Urology;  Laterality: N/A;  . CYSTOSCOPY W/ RETROGRADES Left 11/17/2017   Procedure: CYSTOSCOPY WITH RETROGRADE /PYELOGRAM/;  Surgeon: Kathie Rhodes, MD;  Location: [facility];  Service: Urology;  Laterality: Left;  . CYSTOSCOPY WITH RETROGRADE PYELOGRAM, URETEROSCOPY AND STENT PLACEMENT Right 02/06/2015   Procedure: RETROGRADE PYELOGRAM, RIGHT URETEROSCOPY STENT PLACEMENT;  Surgeon: Kathie Rhodes, MD;  Location: [facility];  Service: Urology;  Laterality: Right;  . CYSTOSCOPY WITH RETROGRADE PYELOGRAM, URETEROSCOPY AND STENT PLACEMENT Right 03/07/2017   Procedure: CYSTOSCOPY WITH RIGHT RETROGRADE PYELOGRAM,RIGHT  URETEROSCOPYLASER LITHOTRIPSY  AND STENT PLACEMENT AND STONE BASKETRY;  Surgeon: Kathie Rhodes, MD;  Location: [place];  Service: Urology;  Laterality: Right;  . EYE SURGERY     cataract surgery  . fatty mass removal  1999   pubic area  . HOLMIUM LASER APPLICATION N/A 4/97/0263   Procedure: HOLMIUM LASER APPLICATION;  Surgeon: Kathie Rhodes, MD;  Location: [facility];  Service: Urology;  Laterality: N/A;  . HOLMIUM LASER APPLICATION Right 7/85/8850   Procedure: HOLMIUM LASER APPLICATION;  Surgeon: Kathie Rhodes, MD;  Location: [facility];  Service: Urology;  Laterality: Right;  . HOLMIUM LASER APPLICATION Left 2/77/4128   Procedure: HOLMIUM LASER APPLICATION;  Surgeon: Kathie Rhodes, MD;  Location: [facility];  Service: Urology;  Laterality: Left;  . IR FLUORO GUIDE PORT INSERTION RIGHT  02/24/2017  . IR NEPHROSTOMY PLACEMENT RIGHT  11/17/2017  . IR PATIENT EVAL TECH 0-60 MINS  03/11/2017  . IR REMOVAL TUN ACCESS W/ PORT W/O FL MOD SED  04/20/2018  . IR US GUIDE VASC ACCESS RIGHT  02/24/2017  . KNEE ARTHROSCOPY     bilateral  . LAPAROSCOPIC GASTRIC BANDING  07/10/2010  .  LAPAROSCOPIC GASTRIC BANDING     Laparoscopic adjustable banding APS System with posterior hiatal hernia, 2 suture.  Marland Kitchen LAPAROTOMY     for ruptured ovary and ovarian artery   . NEPHROLITHOTOMY Right 11/17/2017   Procedure: NEPHROLITHOTOMY PERCUTANEOUS;  Surgeon: Kathie Rhodes, MD;  [facility];  Service: Urology;  Laterality: Right;  . PACEMAKER INSERTION  03/10/2013   MDT dual chamber PPM  . POCKET REVISION N/A 12/08/2013   Procedure: POCKET REVISION;  Surgeon: Deboraha Sprang, MD;  Location[facility] CATH LAB;  Service: Cardiovascular;  Laterality: N/A;  . PORTA CATH INSERTION    . REVERSE SHOULDER ARTHROPLASTY Right 05/14/2018   Procedure: RIGHT REVERSE SHOULDER ARTHROPLASTY;  Surgeon:  Tania Ade, MD;  Location: [facility];  Service: Orthopedics;  Laterality: Right;  . REVERSE SHOULDER REPLACEMENT Right 05/14/2018  . TEE WITH CARDIOVERSION  09/22/2017  . TEE WITHOUT CARDIOVERSION N/A 10/03/2017   Procedure: TRANSESOPHAGEAL ECHOCARDIOGRAM (TEE);  Surgeon: Sanda Klein, MD;  Location: [place];  Service: Cardiovascular;  Laterality: N/A;  . THYROIDECTOMY  1998   Dr Margot Chimes  . TONSILLECTOMY    . TOTAL KNEE ARTHROPLASTY  04/13/2012   Procedure: TOTAL KNEE ARTHROPLASTY;  Surgeon: Rudean Haskell, MD;  Location: [facility];  Service: Orthopedics;  Laterality: Right;  Marland Kitchen VIDEO BRONCHOSCOPY WITH ENDOBRONCHIAL ULTRASOUND N/A 02/07/2017   Procedure: VIDEO BRONCHOSCOPY WITH ENDOBRONCHIAL ULTRASOUND;  Surgeon: Marshell Garfinkel, MD;  Location: [place];  Service: Pulmonary;  Laterality: N/A;    There were no vitals filed for this visit.  Subjective Assessment - 09/29/18 0940    Subjective  "I felt bad yesterday when doing the exercises.  I actually started dry heaving."     Pertinent History  lymphoma (had chemotherapy), vertigo, small vessel disease, arthritis (has had R knee and reverse total shoulder on R)    Limitations  Walking;House hold activities    Patient Stated Goals  "I want to be able to walk  in a normal fashion"     Currently in Pain?  No/denies             Vestibular Assessment - 09/29/18 0940      Symptom Behavior   Type of Dizziness  Spinning    Frequency of Dizziness  when sitting up in bed    Duration of Dizziness  short duration    Aggravating Factors  Supine to sit    Relieving Factors  Slow movements;Closing eyes      Occulomotor Exam   Occulomotor Alignment  Normal    Spontaneous  Absent      Positional Testing   Dix-Hallpike  Dix-Hallpike Right;Dix-Hallpike Left      Dix-Hallpike Right   Dix-Hallpike Right Duration  3-5 secs     Dix-Hallpike Right Symptoms  Upbeat, right rotatory nystagmus      Dix-Hallpike Left   Dix-Hallpike Left Duration  5 secs   then no nystagmus on second trial   Dix-Hallpike Left Symptoms  Upbeat, left rotatory nystagmus                Vestibular Treatment/Exercise - 09/29/18 0940      Vestibular Treatment/Exercise   Vestibular Treatment Provided  Canalith Repositioning    Canalith Repositioning  Epley Manuever Right;Epley Manuever Left    Gaze Exercises  X1 Viewing Horizontal       EPLEY MANUEVER RIGHT   Number of Reps   1    Overall Response  No change    Response Details   Pt positive with R Dix Hallpike and therefore performed R Epley, but did not re-check due to time and pt not feeling well.        EPLEY MANUEVER LEFT   Number of Reps   3    Overall Response   Improved Symptoms     RESPONSE DETAILS LEFT  Pt positive on initial rep of L [employer], but was negative of last two trials.  Positive nystagmus and symptoms when turning R on both trials.       X1 Viewing Horizontal   Foot Position  seated, apart    Time  --   5 secs   Reps  1    Comments  Pt unable  to tolerate without maximum increase in nausea/dizziness, therefore discontinued exercise for today.             PT Education - 09/29/18 1124    Education Details  Education on VOR and that pt has multi canal BPPV    Person(s)  Educated  Patient    Methods  Explanation    Comprehension  Verbalized understanding       PT Short Term Goals - 09/22/18 1025      PT SHORT TERM GOAL #1   Title  Pt will be independent with initial HEP in order to indicate decreased fall risk and improved functional mobility (Target Date: 09/20/18)    Baseline  met per verbal report    Time  5    Period  Weeks    Status  Achieved      PT SHORT TERM GOAL #2   Title  Pt will perform DGI and improve score 3 points from baseline in order to indicate decreased fall risk.      Baseline  15/24 on 12/10 to 22/24 on 12/31    Time  5    Period  Weeks    Status  Achieved      PT SHORT TERM GOAL #3   Title  Pt will improve gait speed to >/=2.62 ft/sec w/ LRAD in order to indicate safe community ambulator.     Baseline  3.50 ft/sec with cane, 3.64 ft/sec without cane on 09/22/18    Time  5    Period  Weeks    Status  Achieved      PT SHORT TERM GOAL #4   Title  Will assess need for AFO/support for LLE and request order as appropriate.      Baseline  deferred at this time, no need     Time  5    Period  Weeks    Status  Deferred      PT SHORT TERM GOAL #5   Title  Will assess 6MWT and improve distance by 150' in order to indicate improved functional endurance.     Baseline  859' on 12/10, 1010' on 12/31    Time  5    Period  Weeks    Status  Achieved      PT SHORT TERM GOAL #6   Title  Pt will perform floor recovery at mod A level in order to indiciate improved ability to get up in case of fall.      Baseline  min A on 09/22/18    Time  5    Period  Weeks    Status  Achieved      PT SHORT TERM GOAL #7   Title  Pt will negotiate up/down curb step with LRAD at mod I level in order to indicate safe community negotiation.     Time  5    Period  Weeks    Status  New        PT Long Term Goals - 09/22/18 1251      PT LONG TERM GOAL #1   Title  Pt will be independent with final HEP in order to indicate decreased fall risk and  improved functional mobility.  (Target Date: 10/20/18    Time  9    Period  Weeks    Status  New      PT LONG TERM GOAL #2   Title  Pt will perform 5TSS in <11 secs without UE support and without using  backs of legs on seated surface in order to indicate improved BLE strength.      Time  9    Period  Weeks    Status  New      PT LONG TERM GOAL #3   Title  Pt will ambulate x 500' w/ LRAD over unlevel outdoor paved surfaces at mod I level in order to indicate safe community mobility.      Time  9    Period  Weeks    Status  New      PT LONG TERM GOAL #4   Title  Pt will improve DGI by 6 points from baseline in order to indicate decreased fall risk.     Baseline  15/24 to 22/24    Time  9    Period  Weeks    Status  Achieved      PT LONG TERM GOAL #5   Title  Pt will perform floor recovery at mod I level in order to indicate improved ability to get up from floor in case of fall.     Time  9    Period  Weeks    Status  Revised      PT LONG TERM GOAL #6   Title  Pt will improve 6MWT by 300' from baseline in order to indicate improved functional endurance.     Time  9    Period  Weeks    Status  New            Plan - 09/29/18 1124    Clinical Impression Statement  Skilled session focused on re-assessment of L Marye Round which was still positive today for L posterior canal BPPV.  Re-performed Epley manuever x 3 reps total and note that on last two reps, only had symptoms/nystagmus with R head turn and sitting up, therefore tested R canal with R Dix hallpike which was also positive and performed single rep of R Epley manuever.  Attempted VOR exercises, however this caused max dizziness/nausea, therefore did not continue with this during session.     Rehab Potential  Good    Clinical Impairments Affecting Rehab Potential  neuropathy, uncertain of reason for adipose tissue in BLEs, chronic small vessel disease    PT Frequency  2x / week    PT Duration  8 weeks    PT  Treatment/Interventions  ADLs/Self Care Home Management;Aquatic Therapy;DME Instruction;Gait training;Stair training;Functional mobility training;Therapeutic activities;Therapeutic exercise;Balance training;Neuromuscular re-education;Patient/family education;Orthotic Fit/Training;Scar mobilization;Passive range of motion;Dry needling;Vestibular    PT Next Visit Plan  Re-assess R dix hallpike, give gaze stabilization, FGA,  UE strength, balance, LE strengthening (esp hip), endurance.     PT Home Exercise Plan  [id number]    Consulted and Agree with Plan of Care  Patient       Patient will benefit from skilled therapeutic intervention in order to improve the following deficits and impairments:  Decreased activity tolerance, Decreased balance, Decreased coordination, Decreased endurance, Decreased knowledge of use of DME, Decreased mobility, Decreased range of motion, Decreased strength, Difficulty walking, Dizziness, Hypomobility, Impaired flexibility, Impaired perceived functional ability, Impaired sensation, Impaired UE functional use, Postural dysfunction  Visit Diagnosis: Dizziness and giddiness  Unsteadiness on feet    Progress Note Reporting Period 08/14/18 to 09/29/18  See note below for Objective Data and Assessment of Progress/Goals.       Problem List Patient Active Problem List   Diagnosis Date Noted  . Gait abnormality 07/10/2018  . S/P reverse total shoulder  arthroplasty, 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  . Persistent atrial fibrillation   . Bilateral ureteral calculi 11/17/2017  . Atrial fibrillation with RVR ([place]) 09/15/2017  . Atrial flutter ([place]) 07/17/2017  . Port catheter in place 04/02/2017  . Non-Hodgkin lymphoma, unspecified, intrathoracic lymph nodes ([place]) 02/13/2017  . Mediastinal mass 02/06/2017  . 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 Abnormal chest x-ray   . Lung mass 02/03/2017  . 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  . OSA (obstructive sleep apnea)  02/03/2015  . History of renal calculi 11/16/2014  . Renal calculi 11/16/2014  . Family history of colon cancer 10/26/2014  . Mechanical complication due to cardiac pacemaker pulse generator 11/06/2013  . Dyspnea 05/12/2013  . Hypothyroidism 04/21/2013  . Pleural effusion 04/19/2013  . Long term (current) use of anticoagulants 12/20/2010  . EPIDERMOID CYST 08/22/2010  . Chronic diastolic heart failure ([place]) 08/06/2010  . SYNCOPE AND COLLAPSE 07/27/2010  . OSTEOARTHRITIS, KNEE, RIGHT 03/15/2010  . Morbid obesity ([place]) 12/07/2009  . NONSPEC ELEVATION OF LEVELS OF TRANSAMINASE/LDH 09/08/2009  . BREAST CANCER, HX OF 07/25/2009  . COLONIC POLYPS, HX OF 07/25/2009  . TUBULOVILLOUS ADENOMA, COLON 04/29/2008  . HYPERGLYCEMIA, FASTING 04/29/2008  . HYPERLIPIDEMIA 06/22/2007  . CARCINOMA, THYROID GLAND, HX OF 06/22/2007    Cameron Sprang, PT, MPT [facility] [address] [place], [place], [address] Phone: [phone]   Fax:  [phone] 09/29/18, 11:29 AM  Name: Kayla Harrison MRN: [id number] Date of Birth: 09-30-1945

## 2018-10-01 ENCOUNTER — Ambulatory Visit: Payer: Medicare Other | Admitting: Physical Therapy

## 2018-10-01 DIAGNOSIS — R2681 Unsteadiness on feet: Secondary | ICD-10-CM

## 2018-10-01 DIAGNOSIS — R293 Abnormal posture: Secondary | ICD-10-CM | POA: Diagnosis not present

## 2018-10-01 DIAGNOSIS — R42 Dizziness and giddiness: Secondary | ICD-10-CM

## 2018-10-01 DIAGNOSIS — M6281 Muscle weakness (generalized): Secondary | ICD-10-CM

## 2018-10-01 DIAGNOSIS — R2689 Other abnormalities of gait and mobility: Secondary | ICD-10-CM | POA: Diagnosis not present

## 2018-10-01 NOTE — Patient Instructions (Addendum)
  Gaze Stabilization: Standing Feet Apart    Feet shoulder width apart, keeping eyes on target on wall _3-4___ feet away, tilt head down 15-30 and move head side to side for _30___ seconds. Repeat while moving head up and down for __30__ seconds. Do _2-3___ sessions per day.    Gaze Stabilization: Tip Card  1.Target must remain in focus, not blurry, and appear stationary while head is in motion. 2.Perform exercises with small head movements (45 to either side of midline). 3.Increase speed of head motion so long as target is in focus. 4.If you wear eyeglasses, be sure you can see target through lens (therapist will give specific instructions for bifocal / progressive lenses). 5.These exercises may provoke dizziness or nausea. Work through these symptoms. If too dizzy, slow head movement slightly. Rest between each exercise. 6.Exercises demand concentration; avoid distractions. 7.For safety, perform standing exercises close to a counter, wall, corner, or next to someone.  Copyright  VHI. All rights reserved.     USE RED THERABAND FOR THE CLAM SHELL EXERCISE - 3 SETS 10 REPS -TRY TO HOLD FOR AT LEAST 2-3 SECS  USE GREEN FOR BUTTERFLY EXERCISE LYING ON YOUR BACK - DO EACH LEG SEPARATELY  HOLD FOR 3-5 SECS - DO 3 SETS 10 REPS

## 2018-10-02 NOTE — Therapy (Signed)
[facility] [address] [place], [place], [address] Phone: [phone]   Fax:  [phone]  Physical Therapy Treatment  Patient Details  Name: Kayla Harrison MRN: [id number] Date of Birth: [birth year] Referring Provider (PT): Sarina Ill, MD   Encounter Date: 10/01/2018  PT End of Session - 10/02/18 1643    Visit Number  11    Number of Visits  17    Date for PT Re-Evaluation  11/12/18    Authorization Type  Medicare and [employer]-needs 10th visit progress note    PT Start Time  3149    PT Stop Time  1532    PT Time Calculation (min)  47 min       Past Medical History:  Diagnosis Date  . Anemia   . Arthritis    osteoarthritis - knees and right shoulder  . Atrial fibrillation [facility])    ablation- 2x's-- 1st time- [facility], 2nd event at [facility] in 2008. Convergent ablation at [facility] 6/14  . Atrial fibrillation ([place])   . Blood transfusion without reported diagnosis   . Breast cancer ([place])    Dr Margot Chimes, total thyroidectomy- 1999- for cancer  . 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 Chronic bilateral pleural effusions   . Colon polyp    Dr Earlean Shawl  . Complete heart block ([place])   . Complication of anesthesia    Ketamine produces LSD reaction, bright colored nightmarish experience   . Dyslipidemia   . Dyspnea   . Endometriosis   . Fibroids   . H/O pleural effusion    s/p thoracentesis w 3291m withdrawn  . Hepatitis    [place]hepatitis   . History of dysphagia    due to radiation therapy  . History of hiatal hernia    small noted on PET scan  . History of kidney stones   . Hx of thyroid cancer    Dr SForde Dandy . Hyperlipidemia   . Hypertension   . Hypothyroidism   . Lung cancer, [place] lobe [place]   radiation RX completed 03/04/17; will start chemo 6/27, pt unaware of lung cancer  . Morbid obesity ([place]    Status post lap band surgery  . Nephrolithiasis   . Non Hodgkin's  lymphoma ([place]    on chemotherapy  . Persistent atrial fibrillation    a. s/p PVI 2008 b. s/p convergent ablation 27026complicated by bradycardia requiring pacemaker implant  . Presence of permanent cardiac pacemaker   . Rotator cuff tear    Right  . Sinus node dysfunction ([place]    Complicating convergent ablation 6/14  . [place]    [place]  . SVC syndrome    with lung mass and non hodgkins lymphoma  . Thyroid cancer ([place]    Past Surgical History:  Procedure Laterality Date  . ABDOMINAL HYSTERECTOMY  1983  . afib ablation     a. 2008 PVI b. 2014 convergent ablation  . APPENDECTOMY    . BONE MARROW BIOPSY  02/21/2017  . BREAST LUMPECTOMY Left 2010  . bso  1998  . CARDIAC CATHETERIZATION     2015- negative  . CARDIOVERSION  10/09/2012   Procedure: CARDIOVERSION;  Surgeon: JMinus Breeding MD;  [place]  Service: Cardiovascular;  Laterality: N/A;  . CARDIOVERSION  10/09/2012   Procedure: CARDIOVERSION;  Surgeon: JMinus Breeding MD;  [place] [facility]  Service: Cardiovascular;  Laterality: N/A;  [name]  the ok to add pt to the add on , but we must check to find out if the can add pt on at 1400 ( [phone])  . CARDIOVERSION N/A 11/20/2012   Procedure: CARDIOVERSION;  Surgeon: Fay Records, MD;  Location: [facility] ENDOSCOPY;  Service: Cardiovascular;  Laterality: N/A;  . CARDIOVERSION N/A 07/18/2017   Procedure: CARDIOVERSION;  Surgeon: Thayer Headings, MD;  Location: [facility] ENDOSCOPY;  Service: Cardiovascular;  Laterality: N/A;  . CARDIOVERSION N/A 10/03/2017   Procedure: CARDIOVERSION;  Surgeon: Sanda Klein, MD;  Location: [facility] ENDOSCOPY;  Service: Cardiovascular;  Laterality: N/A;  . CARDIOVERSION N/A 01/07/2018   Procedure: CARDIOVERSION;  Surgeon: Acie Fredrickson Wonda Cheng, MD;  Location: [facility];  Service: Cardiovascular;  Laterality: N/A;  . CHOLECYSTECTOMY    . COLONOSCOPY W/ POLYPECTOMY     Dr Earlean Shawl  . CYSTOSCOPY N/A 02/06/2015   Procedure: CYSTOSCOPY;   Surgeon: Kathie Rhodes, MD;  [facility]: [facility];  Service: Urology;  Laterality: N/A;  . CYSTOSCOPY W/ RETROGRADES Left 11/17/2017   Procedure: CYSTOSCOPY WITH RETROGRADE /PYELOGRAM/;  Surgeon: Kathie Rhodes, MD;  Location: [facility];  Service: Urology;  Laterality: Left;  . CYSTOSCOPY WITH RETROGRADE PYELOGRAM, URETEROSCOPY AND STENT PLACEMENT Right 02/06/2015   Procedure: RETROGRADE PYELOGRAM, RIGHT URETEROSCOPY STENT PLACEMENT;  Surgeon: Kathie Rhodes, MD;  [facility]: [facility];  Service: Urology;  Laterality: Right;  . CYSTOSCOPY WITH RETROGRADE PYELOGRAM, URETEROSCOPY AND STENT PLACEMENT Right 03/07/2017   Procedure: CYSTOSCOPY WITH RIGHT RETROGRADE PYELOGRAM,RIGHT  URETEROSCOPYLASER LITHOTRIPSY  AND STENT PLACEMENT AND STONE BASKETRY;  Surgeon: Kathie Rhodes, MD;  Location: [facility];  Service: Urology;  Laterality: Right;  . EYE SURGERY     cataract surgery  . fatty mass removal  1999   pubic area  . HOLMIUM LASER APPLICATION N/A 1/61/0960   Procedure: HOLMIUM LASER APPLICATION;  Surgeon: Kathie Rhodes, MD;  [facility]: [facility];  Service: Urology;  Laterality: N/A;  . HOLMIUM LASER APPLICATION Right 4/54/0981   Procedure: HOLMIUM LASER APPLICATION;  Surgeon: Kathie Rhodes, MD;  Location: [facility];  Service: Urology;  Laterality: Right;  . HOLMIUM LASER APPLICATION Left 1/91/4782   Procedure: HOLMIUM LASER APPLICATION;  Surgeon: Kathie Rhodes, MD;  [facility];  Service: Urology;  Laterality: Left;  . IR FLUORO GUIDE PORT INSERTION RIGHT  02/24/2017  . IR NEPHROSTOMY PLACEMENT RIGHT  11/17/2017  . IR PATIENT EVAL TECH 0-60 MINS  03/11/2017  . IR REMOVAL TUN ACCESS W/ PORT W/O FL MOD SED  04/20/2018  . IR US GUIDE VASC ACCESS RIGHT  02/24/2017  . KNEE ARTHROSCOPY     bilateral  . LAPAROSCOPIC GASTRIC BANDING  07/10/2010  . LAPAROSCOPIC GASTRIC BANDING     Laparoscopic adjustable banding APS System with posterior hiatal hernia, 2 suture.  Marland Kitchen LAPAROTOMY     for ruptured  ovary and ovarian artery   . NEPHROLITHOTOMY Right 11/17/2017   Procedure: NEPHROLITHOTOMY PERCUTANEOUS;  Surgeon: Kathie Rhodes, MD;  [facility];  Service: Urology;  Laterality: Right;  . PACEMAKER INSERTION  03/10/2013   MDT dual chamber PPM  . POCKET REVISION N/A 12/08/2013   Procedure: POCKET REVISION;  Surgeon: Deboraha Sprang, MD;  Location: [facility] CATH LAB;  Service: Cardiovascular;  Laterality: N/A;  . PORTA CATH INSERTION    . REVERSE SHOULDER ARTHROPLASTY Right 05/14/2018   Procedure: RIGHT REVERSE SHOULDER ARTHROPLASTY;  Surgeon: Tania Ade, MD;  Location: [facility];  Service: Orthopedics;  Laterality: Right;  . REVERSE SHOULDER REPLACEMENT Right 05/14/2018  . TEE  WITH CARDIOVERSION  09/22/2017  . TEE WITHOUT CARDIOVERSION N/A 10/03/2017   Procedure: TRANSESOPHAGEAL ECHOCARDIOGRAM (TEE);  Surgeon: Sanda Klein, MD;  Location: [facility];  Service: Cardiovascular;  Laterality: N/A;  . THYROIDECTOMY  1998   Dr Margot Chimes  . TONSILLECTOMY    . TOTAL KNEE ARTHROPLASTY  04/13/2012   Procedure: TOTAL KNEE ARTHROPLASTY;  Surgeon: Rudean Haskell, MD;  Location: [facility];  Service: Orthopedics;  Laterality: Right;  Marland Kitchen VIDEO BRONCHOSCOPY WITH ENDOBRONCHIAL ULTRASOUND N/A 02/07/2017   Procedure: VIDEO BRONCHOSCOPY WITH ENDOBRONCHIAL ULTRASOUND;  Surgeon: Marshell Garfinkel, MD;  Location: [facility];  Service: Pulmonary;  Laterality: N/A;    There were no vitals filed for this visit.  Subjective Assessment - 10/02/18 1635    Subjective  Pt reports the vertigo seems to be better; pt has brought her yoga mat to PT session - states "I want to practice getting up off of the floor"    Pertinent History  lymphoma (had chemotherapy), vertigo, small vessel disease, arthritis (has had R knee and reverse total shoulder on R)    Limitations  Walking;House hold activities    Patient Stated Goals  "I want to be able to walk in a normal fashion"     Currently in Pain?  No/denies                        [facility] Adult PT Treatment/Exercise - 10/02/18 0001      Therapeutic Activites    Therapeutic Activities  Other Therapeutic Activities    Other Therapeutic Activities  Pt performed floor to stand transfer with yoga mat doubled for knee comfort with increased cushioning; pt transferred from sitting to floor with UE support on mat, to side sitting on floor to tall kneeling to stand with bil. UE support on mat table with SBA; pt requested not to perform for 2ns rep as she was pleased she was able to complete the first transfer without any assistance      Knee/Hip Exercises: Supine   Other Supine Knee/Hip Exercises  hip abduction in hooklying - with green theraband as red theraband provided insufficient resistance       Knee/Hip Exercises: Sidelying   Hip ABduction  AROM;Both;1 set;10 reps    Clams  10 reps each leg leg - red theraband used for resistance      Rt and Lt Dix-Hallpike tests (-) with no nystagmus and no c/o vertigo; Rt Dix-Hallpike test performed 2 reps for confirmation of  Negative test result  Sit to Rt sidelying test (-) for nystagmus and c/o dizziness; pt did c/o mild light-headedness with return to upright sitting    Balance Exercises - 10/02/18 1640      Balance Exercises: Standing   Standing Eyes Opened  Narrow base of support (BOS);Wide (BOA);Head turns;Foam/compliant surface;5 reps   horizontal and vertical head turns    Standing Eyes Closed  Wide (BOA);Head turns;Foam/compliant surface;5 reps        PT Education - 10/02/18 1642    Education Details  gave pt red theraband for use with clam shell ex and green band for hip abdct. in hooklying position; gaze stabilization in standing    Person(s) Educated  Patient    Methods  Explanation;Demonstration;Handout    Comprehension  Verbalized understanding;Returned demonstration       PT Short Term Goals - 09/22/18 1025      PT SHORT TERM GOAL #1   Title  Pt will be independent  with initial  HEP in order to indicate decreased fall risk and improved functional mobility (Target Date: 09/20/18)    Baseline  met per verbal report    Time  5    Period  Weeks    Status  Achieved      PT SHORT TERM GOAL #2   Title  Pt will perform DGI and improve score 3 points from baseline in order to indicate decreased fall risk.      Baseline  15/24 on 12/10 to 22/24 on 12/31    Time  5    Period  Weeks    Status  Achieved      PT SHORT TERM GOAL #3   Title  Pt will improve gait speed to >/=2.62 ft/sec w/ LRAD in order to indicate safe community ambulator.     Baseline  3.50 ft/sec with cane, 3.64 ft/sec without cane on 09/22/18    Time  5    Period  Weeks    Status  Achieved      PT SHORT TERM GOAL #4   Title  Will assess need for AFO/support for LLE and request order as appropriate.      Baseline  deferred at this time, no need     Time  5    Period  Weeks    Status  Deferred      PT SHORT TERM GOAL #5   Title  Will assess 6MWT and improve distance by 150' in order to indicate improved functional endurance.     Baseline  859' on 12/10, 1010' on 12/31    Time  5    Period  Weeks    Status  Achieved      PT SHORT TERM GOAL #6   Title  Pt will perform floor recovery at mod A level in order to indiciate improved ability to get up in case of fall.      Baseline  min A on 09/22/18    Time  5    Period  Weeks    Status  Achieved      PT SHORT TERM GOAL #7   Title  Pt will negotiate up/down curb step with LRAD at mod I level in order to indicate safe community negotiation.     Time  5    Period  Weeks    Status  New        PT Long Term Goals - 09/22/18 1251      PT LONG TERM GOAL #1   Title  Pt will be independent with final HEP in order to indicate decreased fall risk and improved functional mobility.  (Target Date: 10/20/18    Time  9    Period  Weeks    Status  New      PT LONG TERM GOAL #2   Title  Pt will perform 5TSS in <11 secs without UE support and  without using backs of legs on seated surface in order to indicate improved BLE strength.      Time  9    Period  Weeks    Status  New      PT LONG TERM GOAL #3   Title  Pt will ambulate x 500' w/ LRAD over unlevel outdoor paved surfaces at mod I level in order to indicate safe community mobility.      Time  9    Period  Weeks    Status  New      PT LONG TERM GOAL #  4   Title  Pt will improve DGI by 6 points from baseline in order to indicate decreased fall risk.     Baseline  15/24 to 22/24    Time  9    Period  Weeks    Status  Achieved      PT LONG TERM GOAL #5   Title  Pt will perform floor recovery at mod I level in order to indicate improved ability to get up from floor in case of fall.     Time  9    Period  Weeks    Status  Revised      PT LONG TERM GOAL #6   Title  Pt will improve 6MWT by 300' from baseline in order to indicate improved functional endurance.     Time  9    Period  Weeks    Status  New            Plan - 10/02/18 1643    Clinical Impression Statement  Pt had (-) Rt and Lt Dix-Hallpike tests with no nystagmus and no c/o vertigo in test positions - indicative of resolution of BPPV; Rt Dix-Hallpike was performed twice for confirmation of (-) test result with no nystagmus occurring and no c/o vertigo on 2nd rep;  Pt able to perform x1 viewing exercise in standing for 30 secs with moderate c/o dizziness with horizontal and very minimal c/o dizziness with vertical  x1 viewing.  Pt able to use theraband for resistance with clam shell exercise and hip abdct. in hooklying position.      Rehab Potential  Good    Clinical Impairments Affecting Rehab Potential  neuropathy, uncertain of reason for adipose tissue in BLEs, chronic small vessel disease    PT Frequency  2x / week    PT Duration  8 weeks    PT Treatment/Interventions  ADLs/Self Care Home Management;Aquatic Therapy;DME Instruction;Gait training;Stair training;Functional mobility training;Therapeutic  activities;Therapeutic exercise;Balance training;Neuromuscular re-education;Patient/family education;Orthotic Fit/Training;Scar mobilization;Passive range of motion;Dry needling;Vestibular    PT Next Visit Plan  check gaze stabilization ex given for HEP;  FGA,  UE strength, balance, LE strengthening (esp hip), endurance.     Consulted and Agree with Plan of Care  Patient       Patient will benefit from skilled therapeutic intervention in order to improve the following deficits and impairments:  Decreased activity tolerance, Decreased balance, Decreased coordination, Decreased endurance, Decreased knowledge of use of DME, Decreased mobility, Decreased range of motion, Decreased strength, Difficulty walking, Dizziness, Hypomobility, Impaired flexibility, Impaired perceived functional ability, Impaired sensation, Impaired UE functional use, Postural dysfunction  Visit Diagnosis: Dizziness and giddiness  Unsteadiness on feet  Muscle weakness (generalized)     Problem List Patient Active Problem List   Diagnosis Date Noted  . Gait abnormality 07/10/2018  . S/P reverse total shoulder arthroplasty, 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  . Persistent atrial fibrillation   . Bilateral ureteral calculi 11/17/2017  . Atrial fibrillation with RVR ([name]) 09/15/2017  . Atrial flutter ([place]) 07/17/2017  . Port catheter in place 04/02/2017  . Non-Hodgkin lymphoma, unspecified, intrathoracic lymph nodes ([place]) 02/13/2017  . Mediastinal mass 02/06/2017  . 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 Abnormal chest x-ray   . Lung mass 02/03/2017  . 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  . OSA (obstructive sleep apnea) 02/03/2015  . History of renal calculi 11/16/2014  . Renal calculi 11/16/2014  . Family history of colon cancer 10/26/2014  . Mechanical complication due to cardiac pacemaker pulse  generator 11/06/2013  . Dyspnea 05/12/2013  . Hypothyroidism 04/21/2013  . Pleural effusion 04/19/2013  . Long term  (current) use of anticoagulants 12/20/2010  . EPIDERMOID CYST 08/22/2010  . Chronic diastolic heart failure ([place]) 08/06/2010  . SYNCOPE AND COLLAPSE 07/27/2010  . OSTEOARTHRITIS, KNEE, RIGHT 03/15/2010  . Morbid obesity ([place]) 12/07/2009  . NONSPEC ELEVATION OF LEVELS OF TRANSAMINASE/LDH 09/08/2009  . BREAST CANCER, HX OF 07/25/2009  . COLONIC POLYPS, HX OF 07/25/2009  . TUBULOVILLOUS ADENOMA, COLON 04/29/2008  . HYPERGLYCEMIA, FASTING 04/29/2008  . HYPERLIPIDEMIA 06/22/2007  . CARCINOMA, THYROID GLAND, HX OF 06/22/2007    Alda Lea, PT 10/02/2018, 4:49 PM  [facility] [address] [place], [place], [address] Phone: [phone]   Fax:  [phone]  Name: BRANDILYNN TAORMINA MRN: [id number] Date of Birth: 01/10/1946

## 2018-10-06 ENCOUNTER — Ambulatory Visit: Payer: Medicare Other | Admitting: Rehabilitation

## 2018-10-06 ENCOUNTER — Encounter: Payer: Self-pay | Admitting: Rehabilitation

## 2018-10-06 DIAGNOSIS — R2681 Unsteadiness on feet: Secondary | ICD-10-CM | POA: Diagnosis not present

## 2018-10-06 DIAGNOSIS — R2689 Other abnormalities of gait and mobility: Secondary | ICD-10-CM

## 2018-10-06 DIAGNOSIS — R293 Abnormal posture: Secondary | ICD-10-CM

## 2018-10-06 DIAGNOSIS — M6281 Muscle weakness (generalized): Secondary | ICD-10-CM | POA: Diagnosis not present

## 2018-10-06 DIAGNOSIS — R42 Dizziness and giddiness: Secondary | ICD-10-CM | POA: Diagnosis not present

## 2018-10-06 NOTE — Therapy (Signed)
[place] [address] [place], [place], [address] Phone: [phone]   Fax:  [phone]  Physical Therapy Treatment  Patient Details  Name: Kayla Harrison MRN: [id number] Date of Birth: [birth year] Referring Provider (PT): Sarina Ill, MD   Encounter Date: 10/06/2018  PT End of Session - 10/06/18 0938    Visit Number  12    Number of Visits  17    Date for PT Re-Evaluation  11/12/18    Authorization Type  Medicare and [employer]-needs 10th visit progress note    PT Start Time  0931    PT Stop Time  1018    PT Time Calculation (min)  47 min    Activity Tolerance  Patient tolerated treatment well    Behavior During Therapy  [facility] for tasks assessed/performed       Past Medical History:  Diagnosis Date  . Anemia   . Arthritis    osteoarthritis - knees and right shoulder  . Atrial fibrillation [facility])    ablation- 2x's-- 1st time- [facility], 2nd event at [facility] in 2008. Convergent ablation at [facility] 6/14  . Atrial fibrillation ([place])   . Blood transfusion without reported diagnosis   . Breast cancer ([place])    Dr Margot Chimes, total thyroidectomy- 1999- for cancer  . 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 Chronic bilateral pleural effusions   . Colon polyp    Dr Earlean Shawl  . Complete heart block ([place])   . Complication of anesthesia    Ketamine produces LSD reaction, bright colored nightmarish experience   . Dyslipidemia   . Dyspnea   . Endometriosis   . Fibroids   . H/O pleural effusion    s/p thoracentesis w 3229m withdrawn  . Hepatitis    [place] ?hepatitis   . History of dysphagia    due to radiation therapy  . History of hiatal hernia    small noted on PET scan  . History of kidney stones   . Hx of thyroid cancer    Dr SForde Dandy . Hyperlipidemia   . Hypertension   . Hypothyroidism   . [place]   radiation RX completed 03/04/17; will start chemo 6/27, pt  unaware of lung cancer  . Morbid obesity ([place]    Status post lap band surgery  . Nephrolithiasis   . Non Hodgkin's lymphoma ([place]    on chemotherapy  . Persistent atrial fibrillation    a. s/p PVI 2008 b. s/p convergent ablation [id number] by bradycardia requiring pacemaker implant  . Presence of permanent cardiac pacemaker   . Rotator cuff tear    Right  . Sinus node dysfunction ([place]    Complicating convergent ablation 6/14  . [place]    [place]  . SVC syndrome    with lung mass and non hodgkins lymphoma  . Thyroid cancer ([place] 2000    Past Surgical History:  Procedure Laterality Date  . ABDOMINAL HYSTERECTOMY  1983  . afib ablation     a. 2008 PVI b. 2014 convergent ablation  . APPENDECTOMY    . BONE MARROW BIOPSY  02/21/2017  . BREAST LUMPECTOMY Left 2010  . bso  1998  . CARDIAC CATHETERIZATION     2015- negative  . CARDIOVERSION  10/09/2012   Procedure: CARDIOVERSION;  Surgeon: JMinus Breeding MD;  [place]  Service: Cardiovascular;  Laterality: N/A;  . CARDIOVERSION  10/09/2012  Procedure: CARDIOVERSION;  Surgeon: Minus Breeding, MD;  Location[facility] ENDOSCOPY;  Service: Cardiovascular;  Laterality: N/A;  [name] gave the ok to add pt to the add on , but we must check to find out if the can add pt on at [phone] ( [phone])  . CARDIOVERSION N/A 11/20/2012   Procedure: CARDIOVERSION;  Surgeon: Fay Records, MD;  Location: [facility] ENDOSCOPY;  Service: Cardiovascular;  Laterality: N/A;  . CARDIOVERSION N/A 07/18/2017   Procedure: CARDIOVERSION;  Surgeon: Thayer Headings, MD;  Location: [facility] ENDOSCOPY;  Service: Cardiovascular;  Laterality: N/A;  . CARDIOVERSION N/A 10/03/2017   Procedure: CARDIOVERSION;  Surgeon: Sanda Klein, MD;  Location: [facility] ENDOSCOPY;  Service: Cardiovascular;  Laterality: N/A;  . CARDIOVERSION N/A 01/07/2018   Procedure: CARDIOVERSION;  Surgeon: Acie Fredrickson Wonda Cheng, MD;  Location: [place];  Service: Cardiovascular;  Laterality: N/A;  .  CHOLECYSTECTOMY    . COLONOSCOPY W/ POLYPECTOMY     Dr Earlean Shawl  . CYSTOSCOPY N/A 02/06/2015   Procedure: CYSTOSCOPY;  Surgeon: Kathie Rhodes, MD;  [facility];  Service: Urology;  Laterality: N/A;  . CYSTOSCOPY W/ RETROGRADES Left 11/17/2017   Procedure: CYSTOSCOPY WITH RETROGRADE /PYELOGRAM/;  Surgeon: Kathie Rhodes, MD;  [facility]: [facility];  Service: Urology;  Laterality: Left;  . CYSTOSCOPY WITH RETROGRADE PYELOGRAM, URETEROSCOPY AND STENT PLACEMENT Right 02/06/2015   Procedure: RETROGRADE PYELOGRAM, RIGHT URETEROSCOPY STENT PLACEMENT;  Surgeon: Kathie Rhodes, MD;  [facility];  Service: Urology;  Laterality: Right;  . CYSTOSCOPY WITH RETROGRADE PYELOGRAM, URETEROSCOPY AND STENT PLACEMENT Right 03/07/2017   Procedure: CYSTOSCOPY WITH RIGHT RETROGRADE PYELOGRAM,RIGHT  URETEROSCOPYLASER LITHOTRIPSY  AND STENT PLACEMENT AND STONE BASKETRY;  Surgeon: Kathie Rhodes, MD;  Location: [place];  Service: Urology;  Laterality: Right;  . EYE SURGERY     cataract surgery  . fatty mass removal  1999   pubic area  . HOLMIUM LASER APPLICATION N/A 9/45/0388   Procedure: HOLMIUM LASER APPLICATION;  Surgeon: Kathie Rhodes, MD;  [facility];  Service: Urology;  Laterality: N/A;  . HOLMIUM LASER APPLICATION Right 05/20/33   Procedure: HOLMIUM LASER APPLICATION;  Surgeon: Kathie Rhodes, MD;  Location: [facility];  Service: Urology;  Laterality: Right;  . HOLMIUM LASER APPLICATION Left 06/09/9149   Procedure: HOLMIUM LASER APPLICATION;  Surgeon: Kathie Rhodes, MD;  [facility];  Service: Urology;  Laterality: Left;  . IR FLUORO GUIDE PORT INSERTION RIGHT  02/24/2017  . IR NEPHROSTOMY PLACEMENT RIGHT  11/17/2017  . IR PATIENT EVAL TECH 0-60 MINS  03/11/2017  . IR REMOVAL TUN ACCESS W/ PORT W/O FL MOD SED  04/20/2018  . IR US GUIDE VASC ACCESS RIGHT  02/24/2017  . KNEE ARTHROSCOPY     bilateral  . LAPAROSCOPIC GASTRIC BANDING  07/10/2010  . LAPAROSCOPIC GASTRIC BANDING      Laparoscopic adjustable banding APS System with posterior hiatal hernia, 2 suture.  Marland Kitchen LAPAROTOMY     for ruptured ovary and ovarian artery   . NEPHROLITHOTOMY Right 11/17/2017   Procedure: NEPHROLITHOTOMY PERCUTANEOUS;  Surgeon: Kathie Rhodes, MD;  [facility];  Service: Urology;  Laterality: Right;  . PACEMAKER INSERTION  03/10/2013   MDT dual chamber PPM  . POCKET REVISION N/A 12/08/2013   Procedure: POCKET REVISION;  Surgeon: Deboraha Sprang, MD;  Location[facility] CATH LAB;  Service: Cardiovascular;  Laterality: N/A;  . PORTA CATH INSERTION    . REVERSE SHOULDER ARTHROPLASTY Right 05/14/2018   Procedure: RIGHT REVERSE SHOULDER ARTHROPLASTY;  Surgeon: Tania Ade, MD;  Location: [place];  Service: Orthopedics;  Laterality: Right;  . REVERSE SHOULDER REPLACEMENT Right 05/14/2018  . TEE WITH CARDIOVERSION  09/22/2017  . TEE WITHOUT CARDIOVERSION N/A 10/03/2017   Procedure: TRANSESOPHAGEAL ECHOCARDIOGRAM (TEE);  Surgeon: Sanda Klein, MD;  Location: [place];  Service: Cardiovascular;  Laterality: N/A;  . THYROIDECTOMY  1998   Dr Margot Chimes  . TONSILLECTOMY    . TOTAL KNEE ARTHROPLASTY  04/13/2012   Procedure: TOTAL KNEE ARTHROPLASTY;  Surgeon: Rudean Haskell, MD;  Location: [place];  Service: Orthopedics;  Laterality: Right;  Marland Kitchen VIDEO BRONCHOSCOPY WITH ENDOBRONCHIAL ULTRASOUND N/A 02/07/2017   Procedure: VIDEO BRONCHOSCOPY WITH ENDOBRONCHIAL ULTRASOUND;  Surgeon: Marshell Garfinkel, MD;  Location: [place];  Service: Pulmonary;  Laterality: N/A;    There were no vitals filed for this visit.  Subjective Assessment - 10/06/18 0938    Subjective  Reports no vertigo over the weekend.      Pertinent History  lymphoma (had chemotherapy), vertigo, small vessel disease, arthritis (has had R knee and reverse total shoulder on R)    Limitations  Walking;House hold activities    Patient Stated Goals  "I want to be able to walk in a normal fashion"     Currently in Pain?  No/denies         [facility]  PT Assessment - 10/06/18 1001      Functional Gait  Assessment   Gait assessed   Yes    Gait Level Surface  Walks 20 ft in less than 7 sec but greater than 5.5 sec, uses assistive device, slower speed, mild gait deviations, or deviates 6-10 in outside of the 12 in walkway width.   5.85 secs   Change in Gait Speed  Able to smoothly change walking speed without loss of balance or gait deviation. Deviate no more than 6 in outside of the 12 in walkway width.    Gait with Horizontal Head Turns  Performs head turns smoothly with no change in gait. Deviates no more than 6 in outside 12 in walkway width    Gait with Vertical Head Turns  Performs head turns with no change in gait. Deviates no more than 6 in outside 12 in walkway width.    Gait and Pivot Turn  Pivot turns safely within 3 sec and stops quickly with no loss of balance.    Step Over Obstacle  Is able to step over one shoe box (4.5 in total height) without changing gait speed. No evidence of imbalance.    Gait with Narrow Base of Support  Ambulates less than 4 steps heel to toe or cannot perform without assistance.    Gait with Eyes Closed  Walks 20 ft, slow speed, abnormal gait pattern, evidence for imbalance, deviates 10-15 in outside 12 in walkway width. Requires more than 9 sec to ambulate 20 ft.    Ambulating Backwards  Walks 20 ft, uses assistive device, slower speed, mild gait deviations, deviates 6-10 in outside 12 in walkway width.    Steps  Alternating feet, must use rail.    Total Score  21         Vestibular Assessment - 10/06/18 1000      Positional Testing   Dix-Hallpike  Dix-Hallpike Right      Dix-Hallpike Right   Dix-Hallpike Right Duration  none    Dix-Hallpike Right Symptoms  No nystagmus               [facility] Adult PT Treatment/Exercise - 10/06/18 1000  Ambulation/Gait   Curb  6: Modified independent (Device/increase time)   performed x 4 reps with SPC, no issues     Therapeutic Activites     Therapeutic Activities  Other Therapeutic Activities    Other Therapeutic Activities  Pt performed floor transfer today with use of personal yoga mat folded to increase cushion.   Pt unable to perform independently today as she reports she feels "weak."  PT provided min A to get from side sit to tall kneel and then from half kneel to stand.  Also assisted to move yoga mat today.  Will continue to address.       Vestibular Treatment/Exercise - 10/06/18 0001      Vestibular Treatment/Exercise   Gaze Exercises  X1 Viewing Vertical      X1 Viewing Horizontal   Foot Position  Standing, feet apart    Time  --   30 secs    Reps  2    Comments  Pt tolerated well without increase in symptoms, therefore had her increase speed of movement and pt tolerated well with slight increase in dizziness.        X1 Viewing Vertical   Foot Position  standing, feet apart     Time  --   30 secs   Reps  2    Comments  Pt performed as in HEP given at last session without increase in symptoms, therefore had her increase speed of movement which increased symptoms slightly.             PT Education - 10/06/18 1142    Education Details  increasing speed of movement with gaze stabilization, FGA score/results    Person(s) Educated  Patient    Methods  Explanation;Demonstration    Comprehension  Verbalized understanding;Returned demonstration       PT Short Term Goals - 10/06/18 1147      PT SHORT TERM GOAL #1   Title  Pt will be independent with initial HEP in order to indicate decreased fall risk and improved functional mobility (Target Date: 09/20/18)    Baseline  met per verbal report    Time  5    Period  Weeks    Status  Achieved      PT SHORT TERM GOAL #2   Title  Pt will perform DGI and improve score 3 points from baseline in order to indicate decreased fall risk.      Baseline  15/24 on 12/10 to 22/24 on 12/31    Time  5    Period  Weeks    Status  Achieved      PT SHORT TERM GOAL #3    Title  Pt will improve gait speed to >/=2.62 ft/sec w/ LRAD in order to indicate safe community ambulator.     Baseline  3.50 ft/sec with cane, 3.64 ft/sec without cane on 09/22/18    Time  5    Period  Weeks    Status  Achieved      PT SHORT TERM GOAL #4   Title  Will assess need for AFO/support for LLE and request order as appropriate.      Baseline  deferred at this time, no need     Time  5    Period  Weeks    Status  Deferred      PT SHORT TERM GOAL #5   Title  Will assess 6MWT and improve distance by 150' in order to indicate improved functional endurance.  Baseline  859' on 12/10, 1010' on 12/31    Time  5    Period  Weeks    Status  Achieved      PT SHORT TERM GOAL #6   Title  Pt will perform floor recovery at mod A level in order to indiciate improved ability to get up in case of fall.      Baseline  min A on 09/22/18    Time  5    Period  Weeks    Status  Achieved      PT SHORT TERM GOAL #7   Title  Pt will negotiate up/down curb step with LRAD at mod I level in order to indicate safe community negotiation.     Baseline  met 10/06/18    Time  5    Period  Weeks    Status  Achieved        PT Long Term Goals - 10/06/18 1148      PT LONG TERM GOAL #1   Title  Pt will be independent with final HEP in order to indicate decreased fall risk and improved functional mobility.  (Target Date: 10/20/18    Time  9    Period  Weeks    Status  New      PT LONG TERM GOAL #2   Title  Pt will perform 5TSS in <11 secs without UE support and without using backs of legs on seated surface in order to indicate improved BLE strength.      Time  9    Period  Weeks    Status  New      PT LONG TERM GOAL #3   Title  Pt will ambulate x 500' w/ LRAD over unlevel outdoor paved surfaces at mod I level in order to indicate safe community mobility.      Time  9    Period  Weeks    Status  New      PT LONG TERM GOAL #4   Title  Pt will improve FGA to >/=25/30 in order to indicate  decreased fall risk.     Baseline  21/30 on 10/06/18    Time  9    Period  Weeks    Status  Revised      PT LONG TERM GOAL #5   Title  Pt will perform floor recovery at mod I level in order to indicate improved ability to get up from floor in case of fall.     Time  9    Period  Weeks    Status  Revised      PT LONG TERM GOAL #6   Title  Pt will improve 6MWT by 300' from baseline in order to indicate improved functional endurance.     Time  9    Period  Weeks    Status  New            Plan - 10/06/18 1145    Clinical Impression Statement  Skilled session focused on assessment of more dynamic balance with FGA.  Pt scored 21/30 indicative of moderate fall risk.  Educated on meaning of results and updating LTGs.  Also continue to address floor recovery which pt was unable to do without min A today but reports feeling weak.  Pt continues to have negative R Dix hallpike and was able to progress gaze stabilization to increased speed of movement with head turns.      Rehab Potential  Good  Clinical Impairments Affecting Rehab Potential  neuropathy, uncertain of reason for adipose tissue in BLEs, chronic small vessel disease    PT Frequency  2x / week    PT Duration  8 weeks    PT Treatment/Interventions  ADLs/Self Care Home Management;Aquatic Therapy;DME Instruction;Gait training;Stair training;Functional mobility training;Therapeutic activities;Therapeutic exercise;Balance training;Neuromuscular re-education;Patient/family education;Orthotic Fit/Training;Scar mobilization;Passive range of motion;Dry needling;Vestibular    PT Next Visit Plan  Update/progress gaze stabilization/corner balance tasks, floor recovery, UE strength, balance, LE strengthening (esp hip), endurance.     Consulted and Agree with Plan of Care  Patient       Patient will benefit from skilled therapeutic intervention in order to improve the following deficits and impairments:  Decreased activity tolerance,  Decreased balance, Decreased coordination, Decreased endurance, Decreased knowledge of use of DME, Decreased mobility, Decreased range of motion, Decreased strength, Difficulty walking, Dizziness, Hypomobility, Impaired flexibility, Impaired perceived functional ability, Impaired sensation, Impaired UE functional use, Postural dysfunction  Visit Diagnosis: Dizziness and giddiness  Unsteadiness on feet  Muscle weakness (generalized)  Other abnormalities of gait and mobility  Abnormal posture     Problem List Patient Active Problem List   Diagnosis Date Noted  . Gait abnormality 07/10/2018  . S/P reverse total shoulder arthroplasty, 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  . Persistent atrial fibrillation   . Bilateral ureteral calculi 11/17/2017  . Atrial fibrillation with RVR ([place]) 09/15/2017  . Atrial flutter ([place]) 07/17/2017  . Port catheter in place 04/02/2017  . Non-Hodgkin lymphoma, unspecified, intrathoracic lymph nodes ([place]) 02/13/2017  . Mediastinal mass 02/06/2017  . 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 Abnormal chest x-ray   . Lung mass 02/03/2017  . 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  . OSA (obstructive sleep apnea) 02/03/2015  . History of renal calculi 11/16/2014  . Renal calculi 11/16/2014  . Family history of colon cancer 10/26/2014  . Mechanical complication due to cardiac pacemaker pulse generator 11/06/2013  . Dyspnea 05/12/2013  . Hypothyroidism 04/21/2013  . Pleural effusion 04/19/2013  . Long term (current) use of anticoagulants 12/20/2010  . EPIDERMOID CYST 08/22/2010  . Chronic diastolic heart failure ([place]) 08/06/2010  . SYNCOPE AND COLLAPSE 07/27/2010  . OSTEOARTHRITIS, KNEE, RIGHT 03/15/2010  . Morbid obesity ([place]) 12/07/2009  . NONSPEC ELEVATION OF LEVELS OF TRANSAMINASE/LDH 09/08/2009  . BREAST CANCER, HX OF 07/25/2009  . COLONIC POLYPS, HX OF 07/25/2009  . TUBULOVILLOUS ADENOMA, COLON 04/29/2008  . HYPERGLYCEMIA, FASTING 04/29/2008  .  HYPERLIPIDEMIA 06/22/2007  . CARCINOMA, THYROID GLAND, HX OF 06/22/2007    Cameron Sprang, PT, MPT [facility] [address] [place], [place], [address] Phone: [phone]   Fax:  [phone] 10/06/18, 11:52 AM  Name: Kayla Harrison MRN: [id number] Date of Birth: [birth year]

## 2018-10-08 ENCOUNTER — Ambulatory Visit: Payer: Medicare Other | Admitting: Rehabilitation

## 2018-10-08 ENCOUNTER — Encounter: Payer: Self-pay | Admitting: Rehabilitation

## 2018-10-08 DIAGNOSIS — R2689 Other abnormalities of gait and mobility: Secondary | ICD-10-CM | POA: Diagnosis not present

## 2018-10-08 DIAGNOSIS — R2681 Unsteadiness on feet: Secondary | ICD-10-CM | POA: Diagnosis not present

## 2018-10-08 DIAGNOSIS — M6281 Muscle weakness (generalized): Secondary | ICD-10-CM | POA: Diagnosis not present

## 2018-10-08 DIAGNOSIS — R42 Dizziness and giddiness: Secondary | ICD-10-CM | POA: Diagnosis not present

## 2018-10-08 DIAGNOSIS — R293 Abnormal posture: Secondary | ICD-10-CM

## 2018-10-08 IMAGING — CR DG ABDOMEN 1V
2 series · 2 of 2 positions shown · non-contrast
Comparison: 02/19/2017

CLINICAL DATA: Preoperative evaluation for upcoming lithotripsy

EXAM:
ABDOMEN - 1 VIEW

[t abdomen supine (1 of 2)]
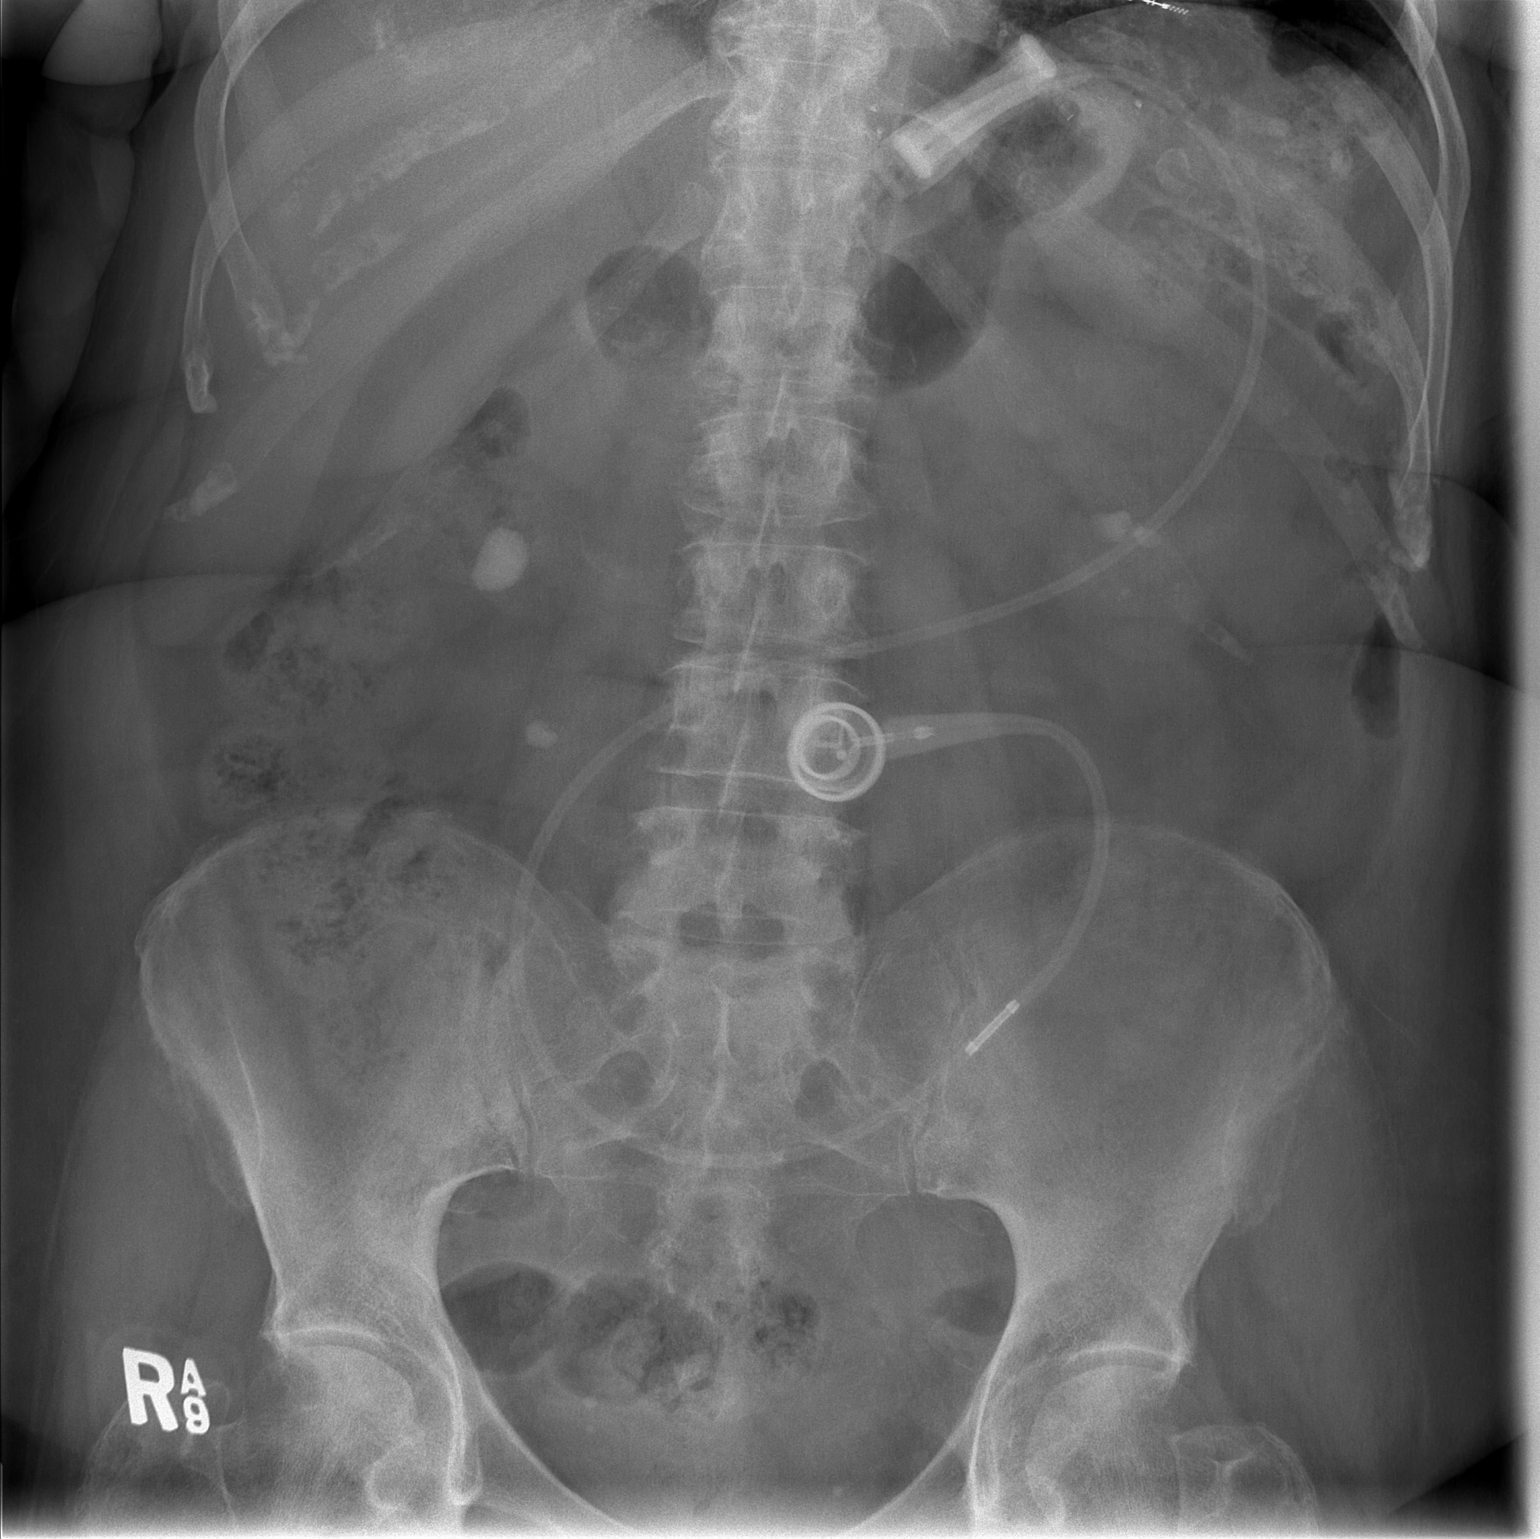

[t abdomen supine (2 of 2)]
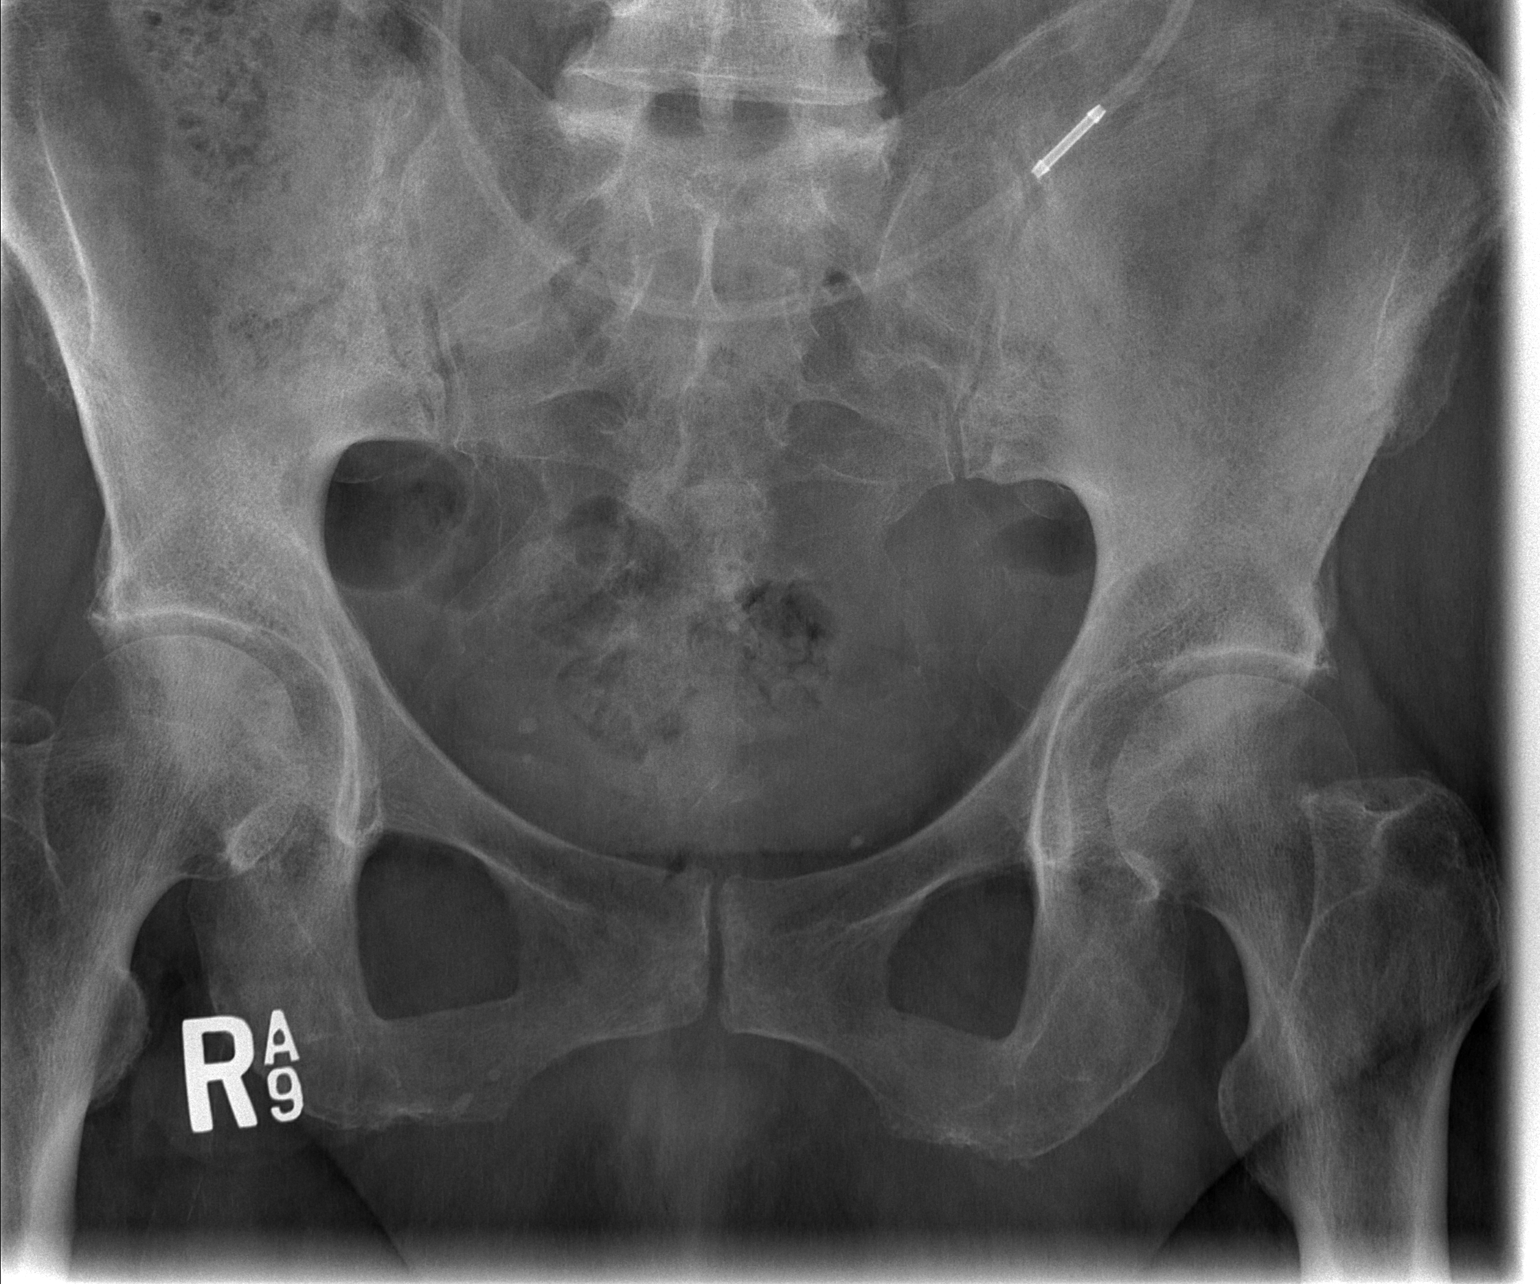

[2 of 2 positions shown; findings below may reference images not displayed]

FINDINGS: Large right renal pelvic stone is again identified and stable. Mid
right ureteral stone is also noted and stable. Scattered left renal
calculi are noted and stable. Gastric lap band is again noted.
Scattered large and small bowel gas is seen. Mild osteophytic
changes of the lumbar spine are noted.
IMPRESSION: Bilateral calculi stable from recent CT examination.

## 2018-10-08 NOTE — Therapy (Signed)
[facility] [address] [place], [place], [address] Phone: [phone]   Fax:  [phone]  Physical Therapy Treatment  Patient Details  Name: Kayla Harrison MRN: [id number] Date of Birth: 05/21/1946 Referring Provider (PT): Sarina Ill, MD   Encounter Date: 10/08/2018  PT End of Session - 10/08/18 1616    Visit Number  13    Number of Visits  17    Date for PT Re-Evaluation  11/12/18    Authorization Type  Medicare and [employer]-needs 10th visit progress note    PT Start Time  1316    PT Stop Time  1405    PT Time Calculation (min)  49 min    Activity Tolerance  Patient tolerated treatment well    Behavior During Therapy  [facility] for tasks assessed/performed       Past Medical History:  Diagnosis Date  . Anemia   . Arthritis    osteoarthritis - knees and right shoulder  . Atrial fibrillation [facility])    ablation- 2x's-- 1st time- [facility], 2nd event at [facility] in 2008. Convergent ablation at [facility] 6/14  . Atrial fibrillation ([place])   . Blood transfusion without reported diagnosis   . Breast cancer ([place])    Dr Margot Chimes, total thyroidectomy- 1999- for cancer  . 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 Chronic bilateral pleural effusions   . Colon polyp    Dr Earlean Shawl  . Complete heart block ([place])   . Complication of anesthesia    Ketamine produces LSD reaction, bright colored nightmarish experience   . Dyslipidemia   . Dyspnea   . Endometriosis   . Fibroids   . H/O pleural effusion    s/p thoracentesis w 3274m withdrawn  . Hepatitis    [place] ?hepatitis   . History of dysphagia    due to radiation therapy  . History of hiatal hernia    small noted on PET scan  . History of kidney stones   . Hx of thyroid cancer    Dr SForde Dandy . Hyperlipidemia   . Hypertension   . Hypothyroidism   . Lung cancer, [place] lobe ([place]   radiation RX completed 03/04/17; will start chemo 6/27, pt  unaware of lung cancer  . Morbid obesity ([place]    Status post lap band surgery  . Nephrolithiasis   . Non Hodgkin's lymphoma ([place]    on chemotherapy  . Persistent atrial fibrillation    a. s/p PVI 2008 b. s/p convergent ablation [id number] by bradycardia requiring pacemaker implant  . Presence of permanent cardiac pacemaker   . Rotator cuff tear    Right  . Sinus node dysfunction ([place]    Complicating convergent ablation 6/14  . [place]    [place]  . SVC syndrome    with lung mass and non hodgkins lymphoma  . Thyroid cancer ([place] 2000    Past Surgical History:  Procedure Laterality Date  . ABDOMINAL HYSTERECTOMY  1983  . afib ablation     a. 2008 PVI b. 2014 convergent ablation  . APPENDECTOMY    . BONE MARROW BIOPSY  02/21/2017  . BREAST LUMPECTOMY Left 2010  . bso  1998  . CARDIAC CATHETERIZATION     2015- negative  . CARDIOVERSION  10/09/2012   Procedure: CARDIOVERSION;  Surgeon: JMinus Breeding MD;  [place] [facility]  Service: Cardiovascular;  Laterality: N/A;  . CARDIOVERSION  10/09/2012  Procedure: CARDIOVERSION;  Surgeon: Minus Breeding, MD;  Location: [facility] ENDOSCOPY;  Service: Cardiovascular;  Laterality: N/A;  [name] gave the ok to add pt to the add on , but we must check to find out if the can add pt on at [phone] ( [phone])  . CARDIOVERSION N/A 11/20/2012   Procedure: CARDIOVERSION;  Surgeon: Fay Records, MD;  Location: [facility] ENDOSCOPY;  Service: Cardiovascular;  Laterality: N/A;  . CARDIOVERSION N/A 07/18/2017   Procedure: CARDIOVERSION;  Surgeon: Thayer Headings, MD;  Location: [facility] ENDOSCOPY;  Service: Cardiovascular;  Laterality: N/A;  . CARDIOVERSION N/A 10/03/2017   Procedure: CARDIOVERSION;  Surgeon: Sanda Klein, MD;  Location: [facility] ENDOSCOPY;  Service: Cardiovascular;  Laterality: N/A;  . CARDIOVERSION N/A 01/07/2018   Procedure: CARDIOVERSION;  Surgeon: Acie Fredrickson Wonda Cheng, MD;  Location: [place];  Service: Cardiovascular;  Laterality: N/A;  .  CHOLECYSTECTOMY    . COLONOSCOPY W/ POLYPECTOMY     Dr Earlean Shawl  . CYSTOSCOPY N/A 02/06/2015   Procedure: CYSTOSCOPY;  Surgeon: Kathie Rhodes, MD;  Location: [facility];  Service: Urology;  Laterality: N/A;  . CYSTOSCOPY W/ RETROGRADES Left 11/17/2017   Procedure: CYSTOSCOPY WITH RETROGRADE /PYELOGRAM/;  Surgeon: Kathie Rhodes, MD;  Location: [facility];  Service: Urology;  Laterality: Left;  . CYSTOSCOPY WITH RETROGRADE PYELOGRAM, URETEROSCOPY AND STENT PLACEMENT Right 02/06/2015   Procedure: RETROGRADE PYELOGRAM, RIGHT URETEROSCOPY STENT PLACEMENT;  Surgeon: Kathie Rhodes, MD;  Location: [facility];  Service: Urology;  Laterality: Right;  . CYSTOSCOPY WITH RETROGRADE PYELOGRAM, URETEROSCOPY AND STENT PLACEMENT Right 03/07/2017   Procedure: CYSTOSCOPY WITH RIGHT RETROGRADE PYELOGRAM,RIGHT  URETEROSCOPYLASER LITHOTRIPSY  AND STENT PLACEMENT AND STONE BASKETRY;  Surgeon: Kathie Rhodes, MD;  Location: [place];  Service: Urology;  Laterality: Right;  . EYE SURGERY     cataract surgery  . fatty mass removal  1999   pubic area  . HOLMIUM LASER APPLICATION N/A 9/45/0388   Procedure: HOLMIUM LASER APPLICATION;  Surgeon: Kathie Rhodes, MD;  Location: [facility];  Service: Urology;  Laterality: N/A;  . HOLMIUM LASER APPLICATION Right 05/20/33   Procedure: HOLMIUM LASER APPLICATION;  Surgeon: Kathie Rhodes, MD;  Location: [facility];  Service: Urology;  Laterality: Right;  . HOLMIUM LASER APPLICATION Left 06/09/9149   Procedure: HOLMIUM LASER APPLICATION;  Surgeon: Kathie Rhodes, MD;  Location: [facility];  Service: Urology;  Laterality: Left;  . IR FLUORO GUIDE PORT INSERTION RIGHT  02/24/2017  . IR NEPHROSTOMY PLACEMENT RIGHT  11/17/2017  . IR PATIENT EVAL TECH 0-60 MINS  03/11/2017  . IR REMOVAL TUN ACCESS W/ PORT W/O FL MOD SED  04/20/2018  . IR US GUIDE VASC ACCESS RIGHT  02/24/2017  . KNEE ARTHROSCOPY     bilateral  . LAPAROSCOPIC GASTRIC BANDING  07/10/2010  . LAPAROSCOPIC GASTRIC BANDING      Laparoscopic adjustable banding APS System with posterior hiatal hernia, 2 suture.  Marland Kitchen LAPAROTOMY     for ruptured ovary and ovarian artery   . NEPHROLITHOTOMY Right 11/17/2017   Procedure: NEPHROLITHOTOMY PERCUTANEOUS;  Surgeon: Kathie Rhodes, MD;  Location: [facility];  Service: Urology;  Laterality: Right;  . PACEMAKER INSERTION  03/10/2013   MDT dual chamber PPM  . POCKET REVISION N/A 12/08/2013   Procedure: POCKET REVISION;  Surgeon: Deboraha Sprang, MD;  Location[facility] CATH LAB;  Service: Cardiovascular;  Laterality: N/A;  . PORTA CATH INSERTION    . REVERSE SHOULDER ARTHROPLASTY Right 05/14/2018   Procedure: RIGHT REVERSE SHOULDER ARTHROPLASTY;  Surgeon: Tania Ade, MD;  Location: [place];  Service: Orthopedics;  Laterality: Right;  . REVERSE SHOULDER REPLACEMENT Right 05/14/2018  . TEE WITH CARDIOVERSION  09/22/2017  . TEE WITHOUT CARDIOVERSION N/A 10/03/2017   Procedure: TRANSESOPHAGEAL ECHOCARDIOGRAM (TEE);  Surgeon: Sanda Klein, MD;  Location: [place];  Service: Cardiovascular;  Laterality: N/A;  . THYROIDECTOMY  1998   Dr Margot Chimes  . TONSILLECTOMY    . TOTAL KNEE ARTHROPLASTY  04/13/2012   Procedure: TOTAL KNEE ARTHROPLASTY;  Surgeon: Rudean Haskell, MD;  Location: [place];  Service: Orthopedics;  Laterality: Right;  Marland Kitchen VIDEO BRONCHOSCOPY WITH ENDOBRONCHIAL ULTRASOUND N/A 02/07/2017   Procedure: VIDEO BRONCHOSCOPY WITH ENDOBRONCHIAL ULTRASOUND;  Surgeon: Marshell Garfinkel, MD;  Location: [place];  Service: Pulmonary;  Laterality: N/A;    There were no vitals filed for this visit.  Subjective Assessment - 10/08/18 1348    Subjective  "I would like to do the floor transfer again today.     Pertinent History  lymphoma (had chemotherapy), vertigo, small vessel disease, arthritis (has had R knee and reverse total shoulder on R)    Limitations  Walking;House hold activities    Patient Stated Goals  "I want to be able to walk in a normal fashion"     Currently in Pain?  No/denies                        [facility] Adult PT Treatment/Exercise - 10/08/18 1320      Self-Care   Self-Care  Other Self-Care Comments    Other Self-Care Comments   Discussed mechanics of floor recovery and how change in height of surface can effect her ability to get back to seated surface.  Also discussed core weakness and muscle groups that may be addressed during pool session.       Therapeutic Activites    Therapeutic Activities  Other Therapeutic Activities    Other Therapeutic Activities  Pt continued to have difficulty with floor transfer today. Performed again from slightly elevated mat, therefore pt unable to use UEs as much for leverage in getting into tall kneeling from side sitting.  Pt eventually needed +2 assist today (min A from each) to get up from floor.  Will address at next session in segments with use of red therapy mat.        Neuro Re-ed    Neuro Re-ed Details   Reviewed corner balance exercises on pillow: feet apart EO with head turns x 10 reps each direction.  No sway or LOB noted, feet apart EC x 20 secs without marked sway.  Progressed patient to feet together EO with head turns.  Pt demonstrates marked sway with this task and several small LOB, there had her take feet slightly apart and perform head motions x 10 reps with EO.  Pt able to do with mild sway, therefore verbally updated HEP.  Also performed feet together w/ EC x 2 sets of 10 secs.  Note marked difficulty, therefore again had her increase BOS slightly and perform task with min to moderate sway.  Continued to address hip and stepping strategy in // bars on foam balance beam maintaining balance with feet apart EO x 2 sets of 20 secs with cues for improved hip strategy and stepping as needed vs using UEs to regain balance.  Pt has marked difficulty with utilizing stepping strategy.  Continued with feet apart EO with head turns x 10 reps in each direction with marked improvement in hip strategy noted with  repetition in tasks.  Continued with standing on foam performing forward stepping alternating LEs x 10 reps, backwards stepping x 10 reps.  Ended on foam beam with feet apart with PT providing external perturbations in varying locations to elicit more automatic hip and stepping strategy.  Pt did very well.        Exercises   Exercises  Other Exercises    Other Exercises   Addressed core strength deficits due to noted difficulty with floor recovery transition.  Performed supine sit up (pt wanted to demonstrate that she is able to do this at home).  She was able to do here (legs extended on mat).  Then had her attempt an elevation and then crossing over body reach with feet extended, however she was unable to do this during session.  Modified exercise to having her lie back on wedge with two pillows and then perform sit up with trunk rotation for more transverse abdominal activation.  Pt tolerated well.  Also had her attempt a modified plank on elbows during session.   She was able to do x 4 reps with 3 sec hold, however note that she does not fully engage core causing increased low back pain.  D/c'd this exercise.               PT Education - 10/08/18 1616    Education Details  verbal modification to corner balance HEP     Person(s) Educated  Patient    Methods  Explanation;Demonstration    Comprehension  Verbalized understanding;Returned demonstration       PT Short Term Goals - 10/06/18 1147      PT SHORT TERM GOAL #1   Title  Pt will be independent with initial HEP in order to indicate decreased fall risk and improved functional mobility (Target Date: 09/20/18)    Baseline  met per verbal report    Time  5    Period  Weeks    Status  Achieved      PT SHORT TERM GOAL #2   Title  Pt will perform DGI and improve score 3 points from baseline in order to indicate decreased fall risk.      Baseline  15/24 on 12/10 to 22/24 on 12/31    Time  5    Period  Weeks    Status  Achieved       PT SHORT TERM GOAL #3   Title  Pt will improve gait speed to >/=2.62 ft/sec w/ LRAD in order to indicate safe community ambulator.     Baseline  3.50 ft/sec with cane, 3.64 ft/sec without cane on 09/22/18    Time  5    Period  Weeks    Status  Achieved      PT SHORT TERM GOAL #4   Title  Will assess need for AFO/support for LLE and request order as appropriate.      Baseline  deferred at this time, no need     Time  5    Period  Weeks    Status  Deferred      PT SHORT TERM GOAL #5   Title  Will assess 6MWT and improve distance by 150' in order to indicate improved functional endurance.     Baseline  859' on 12/10, 1010' on 12/31    Time  5    Period  Weeks    Status  Achieved      PT SHORT TERM GOAL #6   Title  Pt will perform floor recovery at mod A level in order to indiciate improved ability to get up in case of fall.      Baseline  min A on 09/22/18    Time  5    Period  Weeks    Status  Achieved      PT SHORT TERM GOAL #7   Title  Pt will negotiate up/down curb step with LRAD at mod I level in order to indicate safe community negotiation.     Baseline  met 10/06/18    Time  5    Period  Weeks    Status  Achieved        PT Long Term Goals - 10/06/18 1148      PT LONG TERM GOAL #1   Title  Pt will be independent with final HEP in order to indicate decreased fall risk and improved functional mobility.  (Target Date: 10/20/18    Time  9    Period  Weeks    Status  New      PT LONG TERM GOAL #2   Title  Pt will perform 5TSS in <11 secs without UE support and without using backs of legs on seated surface in order to indicate improved BLE strength.      Time  9    Period  Weeks    Status  New      PT LONG TERM GOAL #3   Title  Pt will ambulate x 500' w/ LRAD over unlevel outdoor paved surfaces at mod I level in order to indicate safe community mobility.      Time  9    Period  Weeks    Status  New      PT LONG TERM GOAL #4   Title  Pt will improve FGA to  >/=25/30 in order to indicate decreased fall risk.     Baseline  21/30 on 10/06/18    Time  9    Period  Weeks    Status  Revised      PT LONG TERM GOAL #5   Title  Pt will perform floor recovery at mod I level in order to indicate improved ability to get up from floor in case of fall.     Time  9    Period  Weeks    Status  Revised      PT LONG TERM GOAL #6   Title  Pt will improve 6MWT by 300' from baseline in order to indicate improved functional endurance.     Time  9    Period  Weeks    Status  New            Plan - 10/08/18 1617    Clinical Impression Statement  Skilled session focused on high level balance (review of current corner HEP) and continuing on compliant surfaces to elicit more hip and stepping strategy.  Also addressed core weakness due to deficits noted today during attempted floor recovery, as she needed increased assist today.     Rehab Potential  Good    Clinical Impairments Affecting Rehab Potential  neuropathy, uncertain of reason for adipose tissue in BLEs, chronic small vessel disease    PT Frequency  2x / week    PT Duration  8 weeks    PT Treatment/Interventions  ADLs/Self Care Home Management;Aquatic Therapy;DME Instruction;Gait training;Stair training;Functional mobility training;Therapeutic activities;Therapeutic exercise;Balance training;Neuromuscular re-education;Patient/family education;Orthotic Fit/Training;Scar mobilization;Passive range of motion;Dry needling;Vestibular    PT Next  Visit Plan  Perform floor recovery with our mat-have her work on transition from side sit to tall kneel, Update/progress gaze stabilization/corner balance tasks, floor recovery, UE strength, balance, LE strengthening (esp hip), endurance.     Consulted and Agree with Plan of Care  Patient       Patient will benefit from skilled therapeutic intervention in order to improve the following deficits and impairments:  Decreased activity tolerance, Decreased balance,  Decreased coordination, Decreased endurance, Decreased knowledge of use of DME, Decreased mobility, Decreased range of motion, Decreased strength, Difficulty walking, Dizziness, Hypomobility, Impaired flexibility, Impaired perceived functional ability, Impaired sensation, Impaired UE functional use, Postural dysfunction  Visit Diagnosis: Unsteadiness on feet  Muscle weakness (generalized)  Other abnormalities of gait and mobility  Abnormal posture     Problem List Patient Active Problem List   Diagnosis Date Noted  . Gait abnormality 07/10/2018  . S/P reverse total shoulder arthroplasty, 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  . Persistent atrial fibrillation   . Bilateral ureteral calculi 11/17/2017  . Atrial fibrillation with RVR ([place]) 09/15/2017  . Atrial flutter ([place]) 07/17/2017  . Port catheter in place 04/02/2017  . Non-Hodgkin lymphoma, unspecified, intrathoracic lymph nodes ([place]) 02/13/2017  . Mediastinal mass 02/06/2017  . 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 Abnormal chest x-ray   . Lung mass 02/03/2017  . 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  . OSA (obstructive sleep apnea) 02/03/2015  . History of renal calculi 11/16/2014  . Renal calculi 11/16/2014  . Family history of colon cancer 10/26/2014  . Mechanical complication due to cardiac pacemaker pulse generator 11/06/2013  . Dyspnea 05/12/2013  . Hypothyroidism 04/21/2013  . Pleural effusion 04/19/2013  . Long term (current) use of anticoagulants 12/20/2010  . EPIDERMOID CYST 08/22/2010  . Chronic diastolic heart failure ([place]) 08/06/2010  . SYNCOPE AND COLLAPSE 07/27/2010  . OSTEOARTHRITIS, KNEE, RIGHT 03/15/2010  . Morbid obesity ([place]) 12/07/2009  . NONSPEC ELEVATION OF LEVELS OF TRANSAMINASE/LDH 09/08/2009  . BREAST CANCER, HX OF 07/25/2009  . COLONIC POLYPS, HX OF 07/25/2009  . TUBULOVILLOUS ADENOMA, COLON 04/29/2008  . HYPERGLYCEMIA, FASTING 04/29/2008  . HYPERLIPIDEMIA 06/22/2007  . CARCINOMA, THYROID  GLAND, HX OF 06/22/2007    Cameron Sprang, PT, MPT [facility] [address] [place], [place], [address] Phone: [phone]   Fax:  [phone] 10/08/18, 4:20 PM  Name: SERIN THORNELL MRN: [id number] Date of Birth: [birth year]

## 2018-10-13 ENCOUNTER — Encounter: Payer: Self-pay | Admitting: Rehabilitation

## 2018-10-13 ENCOUNTER — Ambulatory Visit: Payer: Medicare Other | Admitting: Rehabilitation

## 2018-10-13 DIAGNOSIS — E7849 Other hyperlipidemia: Secondary | ICD-10-CM | POA: Diagnosis not present

## 2018-10-13 DIAGNOSIS — R2689 Other abnormalities of gait and mobility: Secondary | ICD-10-CM | POA: Diagnosis not present

## 2018-10-13 DIAGNOSIS — I679 Cerebrovascular disease, unspecified: Secondary | ICD-10-CM | POA: Diagnosis not present

## 2018-10-13 DIAGNOSIS — N183 Chronic kidney disease, stage 3 (moderate): Secondary | ICD-10-CM | POA: Diagnosis not present

## 2018-10-13 DIAGNOSIS — D509 Iron deficiency anemia, unspecified: Secondary | ICD-10-CM | POA: Diagnosis not present

## 2018-10-13 DIAGNOSIS — R293 Abnormal posture: Secondary | ICD-10-CM | POA: Diagnosis not present

## 2018-10-13 DIAGNOSIS — I480 Paroxysmal atrial fibrillation: Secondary | ICD-10-CM | POA: Diagnosis not present

## 2018-10-13 DIAGNOSIS — R269 Unspecified abnormalities of gait and mobility: Secondary | ICD-10-CM | POA: Diagnosis not present

## 2018-10-13 DIAGNOSIS — M6281 Muscle weakness (generalized): Secondary | ICD-10-CM | POA: Diagnosis not present

## 2018-10-13 DIAGNOSIS — C884 Extranodal marginal zone B-cell lymphoma of mucosa-associated lymphoid tissue [MALT-lymphoma]: Secondary | ICD-10-CM | POA: Diagnosis not present

## 2018-10-13 DIAGNOSIS — R2681 Unsteadiness on feet: Secondary | ICD-10-CM

## 2018-10-13 DIAGNOSIS — C73 Malignant neoplasm of thyroid gland: Secondary | ICD-10-CM | POA: Diagnosis not present

## 2018-10-13 DIAGNOSIS — R42 Dizziness and giddiness: Secondary | ICD-10-CM | POA: Diagnosis not present

## 2018-10-13 DIAGNOSIS — R7301 Impaired fasting glucose: Secondary | ICD-10-CM | POA: Diagnosis not present

## 2018-10-13 DIAGNOSIS — E890 Postprocedural hypothyroidism: Secondary | ICD-10-CM | POA: Diagnosis not present

## 2018-10-13 DIAGNOSIS — C50911 Malignant neoplasm of unspecified site of right female breast: Secondary | ICD-10-CM | POA: Diagnosis not present

## 2018-10-13 DIAGNOSIS — I779 Disorder of arteries and arterioles, unspecified: Secondary | ICD-10-CM | POA: Diagnosis not present

## 2018-10-13 NOTE — Therapy (Signed)
[facility] [address] [place], [place], [address] Phone: [phone]   Fax:  [phone]  Physical Therapy Treatment  Patient Details  Name: Kayla Harrison MRN: [id number] Date of Birth: Sep 09, 1946 Referring Provider (PT): Sarina Ill, MD   Encounter Date: 10/13/2018  PT End of Session - 10/13/18 1146    Visit Number  14    Number of Visits  17    Date for PT Re-Evaluation  11/12/18    Authorization Type  Medicare and [employer]-needs 10th visit progress note    PT Start Time  1017    PT Stop Time  1102    PT Time Calculation (min)  45 min    Activity Tolerance  Patient tolerated treatment well    Behavior During Therapy  [facility] for tasks assessed/performed       Past Medical History:  Diagnosis Date  . Anemia   . Arthritis    osteoarthritis - knees and right shoulder  . Atrial fibrillation [facility])    ablation- 2x's-- 1st time- [facility], 2nd event at [facility] in 2008. Convergent ablation at [facility] 6/14  . Atrial fibrillation ([place])   . Blood transfusion without reported diagnosis   . Breast cancer ([place])    Dr Margot Chimes, total thyroidectomy- 1999- for cancer  . 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 Chronic bilateral pleural effusions   . Colon polyp    Dr Earlean Shawl  . Complete heart block ([place])   . Complication of anesthesia    Ketamine produces LSD reaction, bright colored nightmarish experience   . Dyslipidemia   . Dyspnea   . Endometriosis   . Fibroids   . H/O pleural effusion    s/p thoracentesis w 3231m withdrawn  . Hepatitis    [place] ?hepatitis   . History of dysphagia    due to radiation therapy  . History of hiatal hernia    small noted on PET scan  . History of kidney stones   . Hx of thyroid cancer    Dr SForde Dandy . Hyperlipidemia   . Hypertension   . Hypothyroidism   . Lung cancer, [place] lobe ([place] 01[place]   radiation RX completed 03/04/17; will start chemo 6/27, pt  unaware of lung cancer  . Morbid obesity ([place]    Status post lap band surgery  . Nephrolithiasis   . Non Hodgkin's lymphoma ([place]    on chemotherapy  . Persistent atrial fibrillation    a. s/p PVI 2008 b. s/p convergent ablation 21497complicated by bradycardia requiring pacemaker implant  . Presence of permanent cardiac pacemaker   . Rotator cuff tear    Right  . Sinus node dysfunction ([place]    Complicating convergent ablation 6/14  . St[place]    [place]  . SVC syndrome    with lung mass and non hodgkins lymphoma  . Thyroid cancer ([place] 2000    Past Surgical History:  Procedure Laterality Date  . ABDOMINAL HYSTERECTOMY  1983  . afib ablation     a. 2008 PVI b. 2014 convergent ablation  . APPENDECTOMY    . BONE MARROW BIOPSY  02/21/2017  . BREAST LUMPECTOMY Left 2010  . bso  1998  . CARDIAC CATHETERIZATION     2015- negative  . CARDIOVERSION  10/09/2012   Procedure: CARDIOVERSION;  Surgeon: JMinus Breeding MD;  [facility]  Service: Cardiovascular;  Laterality: N/A;  . CARDIOVERSION  10/09/2012  Procedure: CARDIOVERSION;  Surgeon: Minus Breeding, MD;  Location: [facility] ENDOSCOPY;  Service: Cardiovascular;  Laterality: N/A;  [name] gave the ok to add pt to the add on , but we must check to find out if the can add pt on at [phone] ( [phone])  . CARDIOVERSION N/A 11/20/2012   Procedure: CARDIOVERSION;  Surgeon: Fay Records, MD;  Location: [facility] ENDOSCOPY;  Service: Cardiovascular;  Laterality: N/A;  . CARDIOVERSION N/A 07/18/2017   Procedure: CARDIOVERSION;  Surgeon: Thayer Headings, MD;  Location: [facility] ENDOSCOPY;  Service: Cardiovascular;  Laterality: N/A;  . CARDIOVERSION N/A 10/03/2017   Procedure: CARDIOVERSION;  Surgeon: Sanda Klein, MD;  Location: [facility] ENDOSCOPY;  Service: Cardiovascular;  Laterality: N/A;  . CARDIOVERSION N/A 01/07/2018   Procedure: CARDIOVERSION;  Surgeon: Acie Fredrickson Wonda Cheng, MD;  Location: [place];  Service: Cardiovascular;  Laterality: N/A;  .  CHOLECYSTECTOMY    . COLONOSCOPY W/ POLYPECTOMY     Dr Earlean Shawl  . CYSTOSCOPY N/A 02/06/2015   Procedure: CYSTOSCOPY;  Surgeon: Kathie Rhodes, MD;  [facility]: [facility];  Service: Urology;  Laterality: N/A;  . CYSTOSCOPY W/ RETROGRADES Left 11/17/2017   Procedure: CYSTOSCOPY WITH RETROGRADE /PYELOGRAM/;  Surgeon: Kathie Rhodes, MD;  Location: [facility];  Service: Urology;  Laterality: Left;  . CYSTOSCOPY WITH RETROGRADE PYELOGRAM, URETEROSCOPY AND STENT PLACEMENT Right 02/06/2015   Procedure: RETROGRADE PYELOGRAM, RIGHT URETEROSCOPY STENT PLACEMENT;  Surgeon: Kathie Rhodes, MD;  [facility];  Service: Urology;  Laterality: Right;  . CYSTOSCOPY WITH RETROGRADE PYELOGRAM, URETEROSCOPY AND STENT PLACEMENT Right 03/07/2017   Procedure: CYSTOSCOPY WITH RIGHT RETROGRADE PYELOGRAM,RIGHT  URETEROSCOPYLASER LITHOTRIPSY  AND STENT PLACEMENT AND STONE BASKETRY;  Surgeon: Kathie Rhodes, MD;  Location: [place];  Service: Urology;  Laterality: Right;  . EYE SURGERY     cataract surgery  . fatty mass removal  1999   pubic area  . HOLMIUM LASER APPLICATION N/A 9/45/0388   Procedure: HOLMIUM LASER APPLICATION;  Surgeon: Kathie Rhodes, MD;  [facility];  Service: Urology;  Laterality: N/A;  . HOLMIUM LASER APPLICATION Right 05/20/33   Procedure: HOLMIUM LASER APPLICATION;  Surgeon: Kathie Rhodes, MD;  Location: [facility];  Service: Urology;  Laterality: Right;  . HOLMIUM LASER APPLICATION Left 06/09/9149   Procedure: HOLMIUM LASER APPLICATION;  Surgeon: Kathie Rhodes, MD;  [facility];  Service: Urology;  Laterality: Left;  . IR FLUORO GUIDE PORT INSERTION RIGHT  02/24/2017  . IR NEPHROSTOMY PLACEMENT RIGHT  11/17/2017  . IR PATIENT EVAL TECH 0-60 MINS  03/11/2017  . IR REMOVAL TUN ACCESS W/ PORT W/O FL MOD SED  04/20/2018  . IR US GUIDE VASC ACCESS RIGHT  02/24/2017  . KNEE ARTHROSCOPY     bilateral  . LAPAROSCOPIC GASTRIC BANDING  07/10/2010  . LAPAROSCOPIC GASTRIC BANDING      Laparoscopic adjustable banding APS System with posterior hiatal hernia, 2 suture.  Marland Kitchen LAPAROTOMY     for ruptured ovary and ovarian artery   . NEPHROLITHOTOMY Right 11/17/2017   Procedure: NEPHROLITHOTOMY PERCUTANEOUS;  Surgeon: Kathie Rhodes, MD;  [facility];  Service: Urology;  Laterality: Right;  . PACEMAKER INSERTION  03/10/2013   MDT dual chamber PPM  . POCKET REVISION N/A 12/08/2013   Procedure: POCKET REVISION;  Surgeon: Deboraha Sprang, MD;  Location: [facility] CATH LAB;  Service: Cardiovascular;  Laterality: N/A;  . PORTA CATH INSERTION    . REVERSE SHOULDER ARTHROPLASTY Right 05/14/2018   Procedure: RIGHT REVERSE SHOULDER ARTHROPLASTY;  Surgeon: Tania Ade, MD;  Location: [place];  Service: Orthopedics;  Laterality: Right;  . REVERSE SHOULDER REPLACEMENT Right 05/14/2018  . TEE WITH CARDIOVERSION  09/22/2017  . TEE WITHOUT CARDIOVERSION N/A 10/03/2017   Procedure: TRANSESOPHAGEAL ECHOCARDIOGRAM (TEE);  Surgeon: Sanda Klein, MD;  Location: [facility];  Service: Cardiovascular;  Laterality: N/A;  . THYROIDECTOMY  1998   Dr Margot Chimes  . TONSILLECTOMY    . TOTAL KNEE ARTHROPLASTY  04/13/2012   Procedure: TOTAL KNEE ARTHROPLASTY;  Surgeon: Rudean Haskell, MD;  Location: [place];  Service: Orthopedics;  Laterality: Right;  Marland Kitchen VIDEO BRONCHOSCOPY WITH ENDOBRONCHIAL ULTRASOUND N/A 02/07/2017   Procedure: VIDEO BRONCHOSCOPY WITH ENDOBRONCHIAL ULTRASOUND;  Surgeon: Marshell Garfinkel, MD;  Location: [place];  Service: Pulmonary;  Laterality: N/A;    There were no vitals filed for this visit.  Subjective Assessment - 10/13/18 1125    Subjective  "I've been a little more sore from the exercises over the weekend.  I also did the stairs yesterday (3 flights) with both rails."     Pertinent History  lymphoma (had chemotherapy), vertigo, small vessel disease, arthritis (has had R knee and reverse total shoulder on R)    Limitations  Walking;House hold activities    Patient Stated Goals  "I want to  be able to walk in a normal fashion"     Currently in Pain?  No/denies                       [facility] Adult PT Treatment/Exercise - 10/13/18 1020      Self-Care   Self-Care  Other Self-Care Comments    Other Self-Care Comments   Discussed aquatic therapy for next week and provided with what to expect handout along with reminder of date and time.  Also had pt schedule for 4 more weeks with plan to continue 2x/wk for 4 more weeks following this POC.  Pt verbalized understanding.       Therapeutic Activites    Therapeutic Activities  Other Therapeutic Activities    Other Therapeutic Activities  Performed floor recovery today with clinics red mat (pt forgot hers plus wanted to address more transitional movements and needed more space than her mat provides).  Pt still needs cues for controlled descent but did go down to knees today.  Once on L hip (side sit) had her move away from mat slightly to place BUEs on floor to left of her and work on getting into quadruped position.  Pt able to do much better today (without assist) however once in R half kneel still needs assist to elevate to standing with hands supported on mat.  Performed x 2 reps.  Pt reporting increased tightness/pain in L hip.  Upon palpation, did note single trigger point in L hip flexor, however also has "ropey" feeling area that does not follow along muscle in that area therefore unsure as to what this may be.  Also felt lipoma type substance at other end of "ropey" area as it was easily moved but it is tender along this whole area.        Exercises   Exercises  Other Exercises    Other Exercises   To address tightness in L hip performed L hip flex stretch off EOM x 2 mins with PT increasing stretch with knee flex.  Also provided STM and light trigger point release to area during stretch.  continued with hip strengthening with hooklying position lifting L foot from mat and lowering to floor then lifting  from floor and placing  back to mat x 10 reps.  Pt needs cues for decreased hip abd during task for more straight plane hip flexion movement.  Added to HEP.  Ended session with prone hip extension.  Performed with R knee flexed x 10 reps and then with leg straight on LLE due to increased weakness.  Performed x 5 reps on L.  Added to HEP.   See pt instruction.         Access Code: [email or web address]  URL: [email or web address]  Date: 10/13/2018  Prepared by: Cameron Sprang   Exercises  Sit to Stand with Arms Crossed - 10 reps - 1 sets - 2x daily - 7x weekly  Supine Straight Leg Raises - 10 reps - 3 sets - 1x daily - 7x weekly  Clamshell - 10 reps - 1 sets - 2x daily - 7x weekly  Sidelying Hip Extension in Abduction - 10 reps - 1 sets - 2x daily - 7x weekly  Supine Figure 4 Piriformis Stretch - 3 reps - 1 sets - 15-20 hold - 1x daily - 7x weekly  Supine ITB Stretch with Strap - 3 reps - 1 sets - 15-20 hold - 1-2x daily - 7x weekly  Wide Stance with Head Nods on Foam Pad - 10 reps - 1 sets - 1x daily - 7x weekly  Wide Stance with Eyes Closed on Foam Pad - 3 reps - 1 sets - 20 hold - 1x daily - 7x weekly  Supine Hip Flexion - 5 reps - 2 sets - 1x daily - 7x weekly  Prone Hip Extension Sequence - 5 reps - 2 sets - 1x daily - 7x weekly  Prone Hip Extension with Bent Knee - 5 reps - 2 sets - 1x daily - 7x weekly       PT Education - 10/13/18 1145    Education Details  see self care, additional hip exercises.     Person(s) Educated  Patient    Methods  Explanation;Demonstration;Handout    Comprehension  Verbalized understanding;Returned demonstration       PT Short Term Goals - 10/06/18 1147      PT SHORT TERM GOAL #1   Title  Pt will be independent with initial HEP in order to indicate decreased fall risk and improved functional mobility (Target Date: 09/20/18)    Baseline  met per verbal report    Time  5    Period  Weeks    Status  Achieved      PT SHORT TERM GOAL #2   Title  Pt will perform DGI  and improve score 3 points from baseline in order to indicate decreased fall risk.      Baseline  15/24 on 12/10 to 22/24 on 12/31    Time  5    Period  Weeks    Status  Achieved      PT SHORT TERM GOAL #3   Title  Pt will improve gait speed to >/=2.62 ft/sec w/ LRAD in order to indicate safe community ambulator.     Baseline  3.50 ft/sec with cane, 3.64 ft/sec without cane on 09/22/18    Time  5    Period  Weeks    Status  Achieved      PT SHORT TERM GOAL #4   Title  Will assess need for AFO/support for LLE and request order as appropriate.      Baseline  deferred at this time, no need  Time  5    Period  Weeks    Status  Deferred      PT SHORT TERM GOAL #5   Title  Will assess 6MWT and improve distance by 150' in order to indicate improved functional endurance.     Baseline  859' on 12/10, 1010' on 12/31    Time  5    Period  Weeks    Status  Achieved      PT SHORT TERM GOAL #6   Title  Pt will perform floor recovery at mod A level in order to indiciate improved ability to get up in case of fall.      Baseline  min A on 09/22/18    Time  5    Period  Weeks    Status  Achieved      PT SHORT TERM GOAL #7   Title  Pt will negotiate up/down curb step with LRAD at mod I level in order to indicate safe community negotiation.     Baseline  met 10/06/18    Time  5    Period  Weeks    Status  Achieved        PT Long Term Goals - 10/06/18 1148      PT LONG TERM GOAL #1   Title  Pt will be independent with final HEP in order to indicate decreased fall risk and improved functional mobility.  (Target Date: 10/20/18    Time  9    Period  Weeks    Status  New      PT LONG TERM GOAL #2   Title  Pt will perform 5TSS in <11 secs without UE support and without using backs of legs on seated surface in order to indicate improved BLE strength.      Time  9    Period  Weeks    Status  New      PT LONG TERM GOAL #3   Title  Pt will ambulate x 500' w/ LRAD over unlevel outdoor  paved surfaces at mod I level in order to indicate safe community mobility.      Time  9    Period  Weeks    Status  New      PT LONG TERM GOAL #4   Title  Pt will improve FGA to >/=25/30 in order to indicate decreased fall risk.     Baseline  21/30 on 10/06/18    Time  9    Period  Weeks    Status  Revised      PT LONG TERM GOAL #5   Title  Pt will perform floor recovery at mod I level in order to indicate improved ability to get up from floor in case of fall.     Time  9    Period  Weeks    Status  Revised      PT LONG TERM GOAL #6   Title  Pt will improve 6MWT by 300' from baseline in order to indicate improved functional endurance.     Time  9    Period  Weeks    Status  New            Plan - 10/13/18 1240    Clinical Impression Statement  Skilled session focused on floor recovery with improved ability to transition from side sit to quadruped but still needing assist from half kneel to stand.  Remainder of session focused on L hip strengthening and  flexibility.     Rehab Potential  Good    Clinical Impairments Affecting Rehab Potential  neuropathy, uncertain of reason for adipose tissue in BLEs, chronic small vessel disease    PT Frequency  2x / week    PT Duration  8 weeks    PT Treatment/Interventions  ADLs/Self Care Home Management;Aquatic Therapy;DME Instruction;Gait training;Stair training;Functional mobility training;Therapeutic activities;Therapeutic exercise;Balance training;Neuromuscular re-education;Patient/family education;Orthotic Fit/Training;Scar mobilization;Passive range of motion;Dry needling;Vestibular    PT Next Visit Plan  How is L hip feeling, get with [name] again about dry needling?, Perform floor recovery with our mat-have her work on transition from side sit to tall kneel, Update/progress gaze stabilization/corner balance tasks, floor recovery, UE strength, balance, LE strengthening (esp hip), endurance.     Consulted and Agree with Plan of Care   Patient       Patient will benefit from skilled therapeutic intervention in order to improve the following deficits and impairments:  Decreased activity tolerance, Decreased balance, Decreased coordination, Decreased endurance, Decreased knowledge of use of DME, Decreased mobility, Decreased range of motion, Decreased strength, Difficulty walking, Dizziness, Hypomobility, Impaired flexibility, Impaired perceived functional ability, Impaired sensation, Impaired UE functional use, Postural dysfunction  Visit Diagnosis: Unsteadiness on feet  Muscle weakness (generalized)  Other abnormalities of gait and mobility  Abnormal posture     Problem List Patient Active Problem List   Diagnosis Date Noted  . Gait abnormality 07/10/2018  . S/P reverse total shoulder arthroplasty, 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  . Persistent atrial fibrillation   . Bilateral ureteral calculi 11/17/2017  . Atrial fibrillation with RVR ([place]) 09/15/2017  . Atrial flutter ([place]) 07/17/2017  . Port catheter in place 04/02/2017  . Non-Hodgkin lymphoma, unspecified, intrathoracic lymph nodes ([place]) 02/13/2017  . Mediastinal mass 02/06/2017  . 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 Abnormal chest x-ray   . Lung mass 02/03/2017  . 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  . OSA (obstructive sleep apnea) 02/03/2015  . History of renal calculi 11/16/2014  . Renal calculi 11/16/2014  . Family history of colon cancer 10/26/2014  . Mechanical complication due to cardiac pacemaker pulse generator 11/06/2013  . Dyspnea 05/12/2013  . Hypothyroidism 04/21/2013  . Pleural effusion 04/19/2013  . Long term (current) use of anticoagulants 12/20/2010  . EPIDERMOID CYST 08/22/2010  . Chronic diastolic heart failure ([place]) 08/06/2010  . SYNCOPE AND COLLAPSE 07/27/2010  . OSTEOARTHRITIS, KNEE, RIGHT 03/15/2010  . Morbid obesity ([place]) 12/07/2009  . NONSPEC ELEVATION OF LEVELS OF TRANSAMINASE/LDH 09/08/2009  . BREAST CANCER, HX OF  07/25/2009  . COLONIC POLYPS, HX OF 07/25/2009  . TUBULOVILLOUS ADENOMA, COLON 04/29/2008  . HYPERGLYCEMIA, FASTING 04/29/2008  . HYPERLIPIDEMIA 06/22/2007  . CARCINOMA, THYROID GLAND, HX OF 06/22/2007    Cameron Sprang, PT, MPT [facility] [address] [place], [place], [address] Phone: [phone]   Fax:  [phone] 10/13/18, 12:43 PM  Name: Kayla Harrison MRN: [id number] Date of Birth: [birth year]

## 2018-10-13 NOTE — Patient Instructions (Addendum)
Aquatic Therapy: What to Expect!  Where:  [facility] [address] [place], [place]  [address] [phone]  NOTE:  Dennis Bast will receive an automated phone message reminding you of your appointment and it will say the appointment is at the [facility] on [address]  We are working to fix this- just know that you will meet [place] at the pool!  How to Prepare: . Please make sure you drink 8 ounces of water about one hour prior to your pool session . A caregiver MUST attend the entire session with the patient.  The caregiver will be responsible for assisting with dressing as well as any toileting needs.  If the patient will be doing a home program this should likely be the person who will assist as well.  . Patients must wear either their street shoes or pool shoes until they are ready to enter the pool with the therapist.  Patients must also wear either street shoes or pool shoes once exiting the pool to walk to the locker room.  This will helps [place] prevent slips and falls.  . Please arrive 15 minutes early to prepare for your pool therapy session . Sign in at the front desk on the clipboard marked for [place] . You may use the locker rooms on your right and then enter directly into the recreation pool (NOT the competition pool) . Please make sure to attend to any toileting needs prior to entering the pool . Please be dressed in your swim suit and on the pool deck at least 5 minutes before your appointment . Once on the pool deck your therapist will ask you to sign the Patient  Consent and Assignment of Benefits form . Your therapist may take your blood pressure prior to, during and after your session if indicated  About the pool: 1. Entering the pool Your therapist will assist you; there are multiple ways to enter including stairs with railings, a walk in ramp, a roll in chair and a mechanical lift. Your therapist will determine the most appropriate way for you. 2. Water  temperature is usually between 86-87 degrees 3. There may be other swimmers in the pool at the same time   Contact Info:     Appointments: [facility]  All sessions are 45 minutes   [place] [address]     Please call the [facility] if   [place], [place]   [address]    you need to cancel or reschedule an appointment.  [phone]       Access Code: [email or web address]  URL: [email or web address]  Date: 10/13/2018  Prepared by: Cameron Sprang   Exercises  Sit to Stand with Arms Crossed - 10 reps - 1 sets - 2x daily - 7x weekly  Supine Straight Leg Raises - 10 reps - 3 sets - 1x daily - 7x weekly  Clamshell - 10 reps - 1 sets - 2x daily - 7x weekly  Sidelying Hip Extension in Abduction - 10 reps - 1 sets - 2x daily - 7x weekly  Supine Figure 4 Piriformis Stretch - 3 reps - 1 sets - 15-20 hold - 1x daily - 7x weekly  Supine ITB Stretch with Strap - 3 reps - 1 sets - 15-20 hold - 1-2x daily - 7x weekly  Wide Stance with Head Nods on Foam Pad - 10 reps - 1 sets - 1x daily - 7x weekly  Wide Stance with Eyes Closed on  Foam Pad - 3 reps - 1 sets - 20 hold - 1x daily - 7x weekly  Supine Hip Flexion - 5 reps - 2 sets - 1x daily - 7x weekly  Prone Hip Extension Sequence - 5 reps - 2 sets - 1x daily - 7x weekly  Prone Hip Extension with Bent Knee - 5 reps - 2 sets - 1x daily - 7x weekly

## 2018-10-15 ENCOUNTER — Ambulatory Visit: Payer: Medicare Other | Admitting: Rehabilitation

## 2018-10-20 ENCOUNTER — Ambulatory Visit: Payer: Medicare Other | Admitting: Rehabilitation

## 2018-10-21 ENCOUNTER — Ambulatory Visit: Payer: Medicare Other | Admitting: Physical Therapy

## 2018-10-22 ENCOUNTER — Ambulatory Visit: Payer: Self-pay | Admitting: Rehabilitation

## 2018-10-26 ENCOUNTER — Ambulatory Visit: Payer: Medicare Other

## 2018-10-27 ENCOUNTER — Ambulatory Visit (INDEPENDENT_AMBULATORY_CARE_PROVIDER_SITE_OTHER): Payer: Medicare Other

## 2018-10-27 ENCOUNTER — Other Ambulatory Visit: Payer: Self-pay | Admitting: Endocrinology

## 2018-10-27 DIAGNOSIS — I4819 Other persistent atrial fibrillation: Secondary | ICD-10-CM

## 2018-10-27 DIAGNOSIS — Z1231 Encounter for screening mammogram for malignant neoplasm of breast: Secondary | ICD-10-CM

## 2018-10-27 DIAGNOSIS — I495 Sick sinus syndrome: Secondary | ICD-10-CM | POA: Diagnosis not present

## 2018-10-29 ENCOUNTER — Ambulatory Visit: Payer: Medicare Other | Attending: Neurology | Admitting: Physical Therapy

## 2018-10-29 DIAGNOSIS — R2689 Other abnormalities of gait and mobility: Secondary | ICD-10-CM | POA: Diagnosis not present

## 2018-10-29 DIAGNOSIS — R42 Dizziness and giddiness: Secondary | ICD-10-CM | POA: Insufficient documentation

## 2018-10-29 DIAGNOSIS — R293 Abnormal posture: Secondary | ICD-10-CM | POA: Diagnosis not present

## 2018-10-29 DIAGNOSIS — R296 Repeated falls: Secondary | ICD-10-CM | POA: Insufficient documentation

## 2018-10-29 DIAGNOSIS — R2681 Unsteadiness on feet: Secondary | ICD-10-CM | POA: Diagnosis not present

## 2018-10-29 DIAGNOSIS — M6281 Muscle weakness (generalized): Secondary | ICD-10-CM

## 2018-10-29 NOTE — Patient Instructions (Signed)
Walking on Toes    Walk on toes for ____ feet while continuing on a straight path.  Do ____ sessions per day.  (IN POOL)   Heel Raise: Bilateral (Standing)    Rise on balls of feet. Repeat ____ times per set. Do ____ sets per session. Do ____ sessions per day.  http://orth.exer.us/39   Copyright  VHI. All rights reserved.  Heel Raise: Unilateral (Standing)    Balance on left foot, then rise on ball of foot. Repeat ____ times per set. Do ____ sets per session. Do ____ sessions per day.  http://orth.exer.us/41   Copyright  VHI. All rights reserved.

## 2018-10-30 DIAGNOSIS — M859 Disorder of bone density and structure, unspecified: Secondary | ICD-10-CM | POA: Diagnosis not present

## 2018-10-30 DIAGNOSIS — M8589 Other specified disorders of bone density and structure, multiple sites: Secondary | ICD-10-CM | POA: Diagnosis not present

## 2018-10-30 LAB — CUP PACEART REMOTE DEVICE CHECK
Battery Voltage: 2.99 V
Brady Statistic AP VP Percent: 5.2 %
Brady Statistic RA Percent Paced: 96.39 %
Brady Statistic RV Percent Paced: 5.27 %
Date Time Interrogation Session: 20200204185913
Implantable Lead Implant Date: 20140618
Implantable Lead Location: 753860
Implantable Pulse Generator Implant Date: 20140618
Lead Channel Impedance Value: 1007 Ohm
Lead Channel Impedance Value: 456 Ohm
Lead Channel Impedance Value: 950 Ohm
Lead Channel Pacing Threshold Amplitude: 0.875 V
Lead Channel Pacing Threshold Pulse Width: 0.4 ms
Lead Channel Setting Pacing Amplitude: 2 V
Lead Channel Setting Sensing Sensitivity: 0.9 mV
MDC IDC LEAD IMPLANT DT: 20140618
MDC IDC LEAD LOCATION: 753859
MDC IDC MSMT BATTERY REMAINING LONGEVITY: 57 mo
MDC IDC MSMT LEADCHNL RA IMPEDANCE VALUE: 361 Ohm
MDC IDC MSMT LEADCHNL RA SENSING INTR AMPL: 0.75 mV
MDC IDC MSMT LEADCHNL RA SENSING INTR AMPL: 0.75 mV
MDC IDC MSMT LEADCHNL RV PACING THRESHOLD AMPLITUDE: 0.625 V
MDC IDC MSMT LEADCHNL RV PACING THRESHOLD PULSEWIDTH: 0.4 ms
MDC IDC MSMT LEADCHNL RV SENSING INTR AMPL: 9.625 mV
MDC IDC MSMT LEADCHNL RV SENSING INTR AMPL: 9.625 mV
MDC IDC SET LEADCHNL RV PACING AMPLITUDE: 2.5 V
MDC IDC SET LEADCHNL RV PACING PULSEWIDTH: 0.4 ms
MDC IDC STAT BRADY AP VS PERCENT: 91.36 %
MDC IDC STAT BRADY AS VP PERCENT: 0.11 %
MDC IDC STAT BRADY AS VS PERCENT: 3.33 %

## 2018-10-30 NOTE — Therapy (Signed)
[facility] [address] [place], [place], [address] Phone: [phone]   Fax:  [phone]  Physical Therapy Treatment  Patient Details  Name: Kayla Harrison MRN: [id number] Date of Birth: 1946-01-29 Referring Provider (PT): Sarina Ill, MD   Encounter Date: 10/29/2018  PT End of Session - 10/30/18 1726    Visit Number  15    Number of Visits  17    Date for PT Re-Evaluation  11/12/18    Authorization Type  Medicare and [employer]-needs 10th visit progress note    PT Start Time  0802    PT Stop Time  0847    PT Time Calculation (min)  45 min    Equipment Utilized During Treatment  Gait belt       Past Medical History:  Diagnosis Date  . Anemia   . Arthritis    osteoarthritis - knees and right shoulder  . Atrial fibrillation [facility])    ablation- 2x's-- 1st time- [facility], 2nd event at [facility] in 2008. Convergent ablation at [facility] 6/14  . Atrial fibrillation ([place])   . Blood transfusion without reported diagnosis   . Breast cancer ([place])    Dr Margot Chimes, total thyroidectomy- 1999- for cancer  . 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 Chronic bilateral pleural effusions   . Colon polyp    Dr Earlean Shawl  . Complete heart block ([place])   . Complication of anesthesia    Ketamine produces LSD reaction, bright colored nightmarish experience   . Dyslipidemia   . Dyspnea   . Endometriosis   . Fibroids   . H/O pleural effusion    s/p thoracentesis w 3236m withdrawn  . Hepatitis    [place] ?hepatitis   . History of dysphagia    due to radiation therapy  . History of hiatal hernia    small noted on PET scan  . History of kidney stones   . Hx of thyroid cancer    Dr SForde Dandy . Hyperlipidemia   . Hypertension   . Hypothyroidism   . Lung cancer, lower lobe ([place] 01/2017   radiation RX completed 03/04/17; will start chemo 6/27, pt unaware of lung cancer  . Morbid obesity ([place]    Status post lap  band surgery  . Nephrolithiasis   . Non Hodgkin's lymphoma ([place]    on chemotherapy  . Persistent atrial fibrillation    a. s/p PVI 2008 b. s/p convergent ablation 21856complicated by bradycardia requiring pacemaker implant  . Presence of permanent cardiac pacemaker   . Rotator cuff tear    Right  . Sinus node dysfunction ([place]    Complicating convergent ablation 6/14  . [place]    [place]  . SVC syndrome    with lung mass and non hodgkins lymphoma  . Thyroid cancer ([place] 2000    Past Surgical History:  Procedure Laterality Date  . ABDOMINAL HYSTERECTOMY  1983  . afib ablation     a. 2008 PVI b. 2014 convergent ablation  . APPENDECTOMY    . BONE MARROW BIOPSY  02/21/2017  . BREAST LUMPECTOMY Left 2010  . bso  1998  . CARDIAC CATHETERIZATION     2015- negative  . CARDIOVERSION  10/09/2012   Procedure: CARDIOVERSION;  Surgeon: JMinus Breeding MD;  [place]  Service: Cardiovascular;  Laterality: N/A;  . CARDIOVERSION  10/09/2012   Procedure: CARDIOVERSION;  Surgeon: JMinus Breeding MD;  [facility];  Service: Cardiovascular;  Laterality: N/A;  [name] gave the ok to add pt to the add on , but we must check to find out if the can add pt on at [phone] ( [phone])  . CARDIOVERSION N/A 11/20/2012   Procedure: CARDIOVERSION;  Surgeon: Fay Records, MD;  Location: [facility] ENDOSCOPY;  Service: Cardiovascular;  Laterality: N/A;  . CARDIOVERSION N/A 07/18/2017   Procedure: CARDIOVERSION;  Surgeon: Thayer Headings, MD;  Location: [facility] ENDOSCOPY;  Service: Cardiovascular;  Laterality: N/A;  . CARDIOVERSION N/A 10/03/2017   Procedure: CARDIOVERSION;  Surgeon: Sanda Klein, MD;  Location: [facility] ENDOSCOPY;  Service: Cardiovascular;  Laterality: N/A;  . CARDIOVERSION N/A 01/07/2018   Procedure: CARDIOVERSION;  Surgeon: Acie Fredrickson Wonda Cheng, MD;  Location: [facility];  Service: Cardiovascular;  Laterality: N/A;  . CHOLECYSTECTOMY    . COLONOSCOPY W/ POLYPECTOMY     Dr Earlean Shawl  .  CYSTOSCOPY N/A 02/06/2015   Procedure: CYSTOSCOPY;  Surgeon: Kathie Rhodes, MD;  [facility]: [facility];  Service: Urology;  Laterality: N/A;  . CYSTOSCOPY W/ RETROGRADES Left 11/17/2017   Procedure: CYSTOSCOPY WITH RETROGRADE /PYELOGRAM/;  Surgeon: Kathie Rhodes, MD;  Location: [facility];  Service: Urology;  Laterality: Left;  . CYSTOSCOPY WITH RETROGRADE PYELOGRAM, URETEROSCOPY AND STENT PLACEMENT Right 02/06/2015   Procedure: RETROGRADE PYELOGRAM, RIGHT URETEROSCOPY STENT PLACEMENT;  Surgeon: Kathie Rhodes, MD;  Location: [facility];  Service: Urology;  Laterality: Right;  . CYSTOSCOPY WITH RETROGRADE PYELOGRAM, URETEROSCOPY AND STENT PLACEMENT Right 03/07/2017   Procedure: CYSTOSCOPY WITH RIGHT RETROGRADE PYELOGRAM,RIGHT  URETEROSCOPYLASER LITHOTRIPSY  AND STENT PLACEMENT AND STONE BASKETRY;  Surgeon: Kathie Rhodes, MD;  Location: [facility];  Service: Urology;  Laterality: Right;  . EYE SURGERY     cataract surgery  . fatty mass removal  1999   pubic area  . HOLMIUM LASER APPLICATION N/A 12/24/4740   Procedure: HOLMIUM LASER APPLICATION;  Surgeon: Kathie Rhodes, MD;  [facility]: [facility];  Service: Urology;  Laterality: N/A;  . HOLMIUM LASER APPLICATION Right 5/95/6387   Procedure: HOLMIUM LASER APPLICATION;  Surgeon: Kathie Rhodes, MD;  Location[facility];  Service: Urology;  Laterality: Right;  . HOLMIUM LASER APPLICATION Left 5/64/3329   Procedure: HOLMIUM LASER APPLICATION;  Surgeon: Kathie Rhodes, MD;  [facility];  Service: Urology;  Laterality: Left;  . IR FLUORO GUIDE PORT INSERTION RIGHT  02/24/2017  . IR NEPHROSTOMY PLACEMENT RIGHT  11/17/2017  . IR PATIENT EVAL TECH 0-60 MINS  03/11/2017  . IR REMOVAL TUN ACCESS W/ PORT W/O FL MOD SED  04/20/2018  . IR US GUIDE VASC ACCESS RIGHT  02/24/2017  . KNEE ARTHROSCOPY     bilateral  . LAPAROSCOPIC GASTRIC BANDING  07/10/2010  . LAPAROSCOPIC GASTRIC BANDING     Laparoscopic adjustable banding APS System with posterior hiatal  hernia, 2 suture.  Marland Kitchen LAPAROTOMY     for ruptured ovary and ovarian artery   . NEPHROLITHOTOMY Right 11/17/2017   Procedure: NEPHROLITHOTOMY PERCUTANEOUS;  Surgeon: Kathie Rhodes, MD;  [facility];  Service: Urology;  Laterality: Right;  . PACEMAKER INSERTION  03/10/2013   MDT dual chamber PPM  . POCKET REVISION N/A 12/08/2013   Procedure: POCKET REVISION;  Surgeon: Deboraha Sprang, MD;  Location: [facility] CATH LAB;  Service: Cardiovascular;  Laterality: N/A;  . PORTA CATH INSERTION    . REVERSE SHOULDER ARTHROPLASTY Right 05/14/2018   Procedure: RIGHT REVERSE SHOULDER ARTHROPLASTY;  Surgeon: Tania Ade, MD;  Location: [facility];  Service: Orthopedics;  Laterality: Right;  .  REVERSE SHOULDER REPLACEMENT Right 05/14/2018  . TEE WITH CARDIOVERSION  09/22/2017  . TEE WITHOUT CARDIOVERSION N/A 10/03/2017   Procedure: TRANSESOPHAGEAL ECHOCARDIOGRAM (TEE);  Surgeon: Sanda Klein, MD;  Location: [facility];  Service: Cardiovascular;  Laterality: N/A;  . THYROIDECTOMY  1998   Dr Margot Chimes  . TONSILLECTOMY    . TOTAL KNEE ARTHROPLASTY  04/13/2012   Procedure: TOTAL KNEE ARTHROPLASTY;  Surgeon: Rudean Haskell, MD;  Location: [facility];  Service: Orthopedics;  Laterality: Right;  Marland Kitchen VIDEO BRONCHOSCOPY WITH ENDOBRONCHIAL ULTRASOUND N/A 02/07/2017   Procedure: VIDEO BRONCHOSCOPY WITH ENDOBRONCHIAL ULTRASOUND;  Surgeon: Marshell Garfinkel, MD;  Location: [facility];  Service: Pulmonary;  Laterality: N/A;    There were no vitals filed for this visit.  Subjective Assessment - 10/30/18 1712    Subjective  Pt states she has been sick - has not done exercises alot; brought her yoga mat to practice floor transfers    Pertinent History  lymphoma (had chemotherapy), vertigo, small vessel disease, arthritis (has had R knee and reverse total shoulder on R)    Patient Stated Goals  "I want to be able to walk in a normal fashion"     Currently in Pain?  No/denies                       [facility] Adult PT  Treatment/Exercise - 10/30/18 0001      Transfers   Transfers  Sit to Stand;Stand to Sit    Sit to Stand  4: Min guard    Number of Reps  Other reps (comment)   5   Comments  feet on blue Airex      Ambulation/Gait   Ambulation/Gait  Yes    Ambulation/Gait Assistance Details  pt reports she is amb. more without use of her cane in home    Ambulation Distance (Feet)  100 Feet    Assistive device  None    Gait Pattern  Step-through pattern    Ambulation Surface  Level;Indoor      Therapeutic Activites    Therapeutic Activities  Other Therapeutic Activities    Other Therapeutic Activities  Pt performed floor to stand transfer using her yoga mat - pt able to transfer from side sitting to tall kneeling with SBA but need mod assist with 1/2 kneeling to standing position      Knee/Hip Exercises: Stretches   Active Hamstring Stretch  Both;1 rep;30 seconds   runner's stretch     Knee/Hip Exercises: Standing   Heel Raises  Both;1 set;10 reps    Hip Flexion  Stengthening;Both;1 set;10 reps;Knee bent;Knee straight   with red theraband   Hip Abduction  Stengthening;Both;1 set;10 reps;Knee straight   with red theraband   Hip Extension  Stengthening;Both;1 set;10 reps;Knee bent   with red theraband   Forward Step Up  Right;Left;1 set;10 reps;Hand Hold: 2;Step Height: 6"          Balance Exercises - 10/30/18 1723      Balance Exercises: Standing   Rockerboard  Anterior/posterior;10 reps;Lateral;Intermittent UE support      Pt performed gaze stabilization with letter approx. 5'  Away - pt reported visual changes in right eye - stating visual acuity was fluctuating -- recommended  To patient that she follow up with ophthalmologist for appt - pt stated it had been about a year since her last eye exam   Pt performed marching on Airex 10 reps each leg Alternate tap downs to floor - 10 reps  each leg with min assist for balance Tap ups to 1st step without use of hand rails 10 reps each leg  with SBA to CGA   PT Short Term Goals - 10/06/18 1147      PT SHORT TERM GOAL #1   Title  Pt will be independent with initial HEP in order to indicate decreased fall risk and improved functional mobility (Target Date: 09/20/18)    Baseline  met per verbal report    Time  5    Period  Weeks    Status  Achieved      PT SHORT TERM GOAL #2   Title  Pt will perform DGI and improve score 3 points from baseline in order to indicate decreased fall risk.      Baseline  15/24 on 12/10 to 22/24 on 12/31    Time  5    Period  Weeks    Status  Achieved      PT SHORT TERM GOAL #3   Title  Pt will improve gait speed to >/=2.62 ft/sec w/ LRAD in order to indicate safe community ambulator.     Baseline  3.50 ft/sec with cane, 3.64 ft/sec without cane on 09/22/18    Time  5    Period  Weeks    Status  Achieved      PT SHORT TERM GOAL #4   Title  Will assess need for AFO/support for LLE and request order as appropriate.      Baseline  deferred at this time, no need     Time  5    Period  Weeks    Status  Deferred      PT SHORT TERM GOAL #5   Title  Will assess 6MWT and improve distance by 150' in order to indicate improved functional endurance.     Baseline  859' on 12/10, 1010' on 12/31    Time  5    Period  Weeks    Status  Achieved      PT SHORT TERM GOAL #6   Title  Pt will perform floor recovery at mod A level in order to indiciate improved ability to get up in case of fall.      Baseline  min A on 09/22/18    Time  5    Period  Weeks    Status  Achieved      PT SHORT TERM GOAL #7   Title  Pt will negotiate up/down curb step with LRAD at mod I level in order to indicate safe community negotiation.     Baseline  met 10/06/18    Time  5    Period  Weeks    Status  Achieved        PT Long Term Goals - 10/06/18 1148      PT LONG TERM GOAL #1   Title  Pt will be independent with final HEP in order to indicate decreased fall risk and improved functional mobility.  (Target Date:  10/20/18    Time  9    Period  Weeks    Status  New      PT LONG TERM GOAL #2   Title  Pt will perform 5TSS in <11 secs without UE support and without using backs of legs on seated surface in order to indicate improved BLE strength.      Time  9    Period  Weeks    Status  New  PT LONG TERM GOAL #3   Title  Pt will ambulate x 500' w/ LRAD over unlevel outdoor paved surfaces at mod I level in order to indicate safe community mobility.      Time  9    Period  Weeks    Status  New      PT LONG TERM GOAL #4   Title  Pt will improve FGA to >/=25/30 in order to indicate decreased fall risk.     Baseline  21/30 on 10/06/18    Time  9    Period  Weeks    Status  Revised      PT LONG TERM GOAL #5   Title  Pt will perform floor recovery at mod I level in order to indicate improved ability to get up from floor in case of fall.     Time  9    Period  Weeks    Status  Revised      PT LONG TERM GOAL #6   Title  Pt will improve 6MWT by 300' from baseline in order to indicate improved functional endurance.     Time  9    Period  Weeks    Status  New            Plan - 10/30/18 1726    Clinical Impression Statement  Pt needed mod assist with floor to stand transfer during transitional movement of 1/2 kneeling to standing position.  Pt reports legs are somewhat weaker due to illness for past 2 weeks with pt not having had PT.  LLE remains somewhat weaker than RLE.    Rehab Potential  Good    Clinical Impairments Affecting Rehab Potential  neuropathy, uncertain of reason for adipose tissue in BLEs, chronic small vessel disease    PT Frequency  2x / week    PT Duration  8 weeks    PT Treatment/Interventions  ADLs/Self Care Home Management;Aquatic Therapy;DME Instruction;Gait training;Stair training;Functional mobility training;Therapeutic activities;Therapeutic exercise;Balance training;Neuromuscular re-education;Patient/family education;Orthotic Fit/Training;Scar mobilization;Passive  range of motion;Dry needling;Vestibular    PT Next Visit Plan  How is L hip feeling, get with [name] again about dry needling?, Perform floor recovery with our mat-have her work on transition from side sit to tall kneel, Update/progress gaze stabilization/corner balance tasks, floor recovery, UE strength, balance, LE strengthening (esp hip), endurance.     PT Home Exercise Plan  [id number]    Consulted and Agree with Plan of Care  Patient       Patient will benefit from skilled therapeutic intervention in order to improve the following deficits and impairments:  Decreased activity tolerance, Decreased balance, Decreased coordination, Decreased endurance, Decreased knowledge of use of DME, Decreased mobility, Decreased range of motion, Decreased strength, Difficulty walking, Dizziness, Hypomobility, Impaired flexibility, Impaired perceived functional ability, Impaired sensation, Impaired UE functional use, Postural dysfunction  Visit Diagnosis: Unsteadiness on feet  Muscle weakness (generalized)  Other abnormalities of gait and mobility     Problem List Patient Active Problem List   Diagnosis Date Noted  . Gait abnormality 07/10/2018  . S/P reverse total shoulder arthroplasty, 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  . Persistent atrial fibrillation   . Bilateral ureteral calculi 11/17/2017  . Atrial fibrillation with RVR ([place]) 09/15/2017  . Atrial flutter ([place]) 07/17/2017  . Port catheter in place 04/02/2017  . Non-Hodgkin lymphoma, unspecified, intrathoracic lymph nodes ([place]) 02/13/2017  . Mediastinal mass 02/06/2017  . 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 Abnormal chest x-ray   . Lung  mass 02/03/2017  . 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  . OSA (obstructive sleep apnea) 02/03/2015  . History of renal calculi 11/16/2014  . Renal calculi 11/16/2014  . Family history of colon cancer 10/26/2014  . Mechanical complication due to cardiac pacemaker pulse generator 11/06/2013  . Dyspnea  05/12/2013  . Hypothyroidism 04/21/2013  . Pleural effusion 04/19/2013  . Long term (current) use of anticoagulants 12/20/2010  . EPIDERMOID CYST 08/22/2010  . Chronic diastolic heart failure ([place]) 08/06/2010  . SYNCOPE AND COLLAPSE 07/27/2010  . OSTEOARTHRITIS, KNEE, RIGHT 03/15/2010  . Morbid obesity ([place]) 12/07/2009  . NONSPEC ELEVATION OF LEVELS OF TRANSAMINASE/LDH 09/08/2009  . BREAST CANCER, HX OF 07/25/2009  . COLONIC POLYPS, HX OF 07/25/2009  . TUBULOVILLOUS ADENOMA, COLON 04/29/2008  . HYPERGLYCEMIA, FASTING 04/29/2008  . HYPERLIPIDEMIA 06/22/2007  . CARCINOMA, THYROID GLAND, HX OF 06/22/2007    Alda Lea, PT 10/30/2018, 5:32 PM  [facility] [address] [place], [place], [address] Phone: [phone]   Fax:  [phone]  Name: Kayla Harrison MRN: [id number] Date of Birth: [birth year]

## 2018-11-03 ENCOUNTER — Ambulatory Visit: Payer: Medicare Other | Admitting: Physical Therapy

## 2018-11-03 DIAGNOSIS — M6281 Muscle weakness (generalized): Secondary | ICD-10-CM | POA: Diagnosis not present

## 2018-11-03 DIAGNOSIS — R2681 Unsteadiness on feet: Secondary | ICD-10-CM | POA: Diagnosis not present

## 2018-11-03 DIAGNOSIS — R2689 Other abnormalities of gait and mobility: Secondary | ICD-10-CM | POA: Diagnosis not present

## 2018-11-03 DIAGNOSIS — R293 Abnormal posture: Secondary | ICD-10-CM | POA: Diagnosis not present

## 2018-11-03 DIAGNOSIS — R296 Repeated falls: Secondary | ICD-10-CM | POA: Diagnosis not present

## 2018-11-03 DIAGNOSIS — R42 Dizziness and giddiness: Secondary | ICD-10-CM | POA: Diagnosis not present

## 2018-11-03 NOTE — Therapy (Signed)
[facility] [address] [place], [place], [address] Phone: [phone]   Fax:  [phone]  Physical Therapy Treatment  Patient Details  Name: Kayla Harrison MRN: [id number] Date of Birth: [birth year] Referring Provider (PT): Sarina Ill, MD   Encounter Date: 11/03/2018  PT End of Session - 11/03/18 1048    Visit Number  16    Number of Visits  17    Date for PT Re-Evaluation  11/12/18    Authorization Type  Medicare and [employer]-needs 10th visit progress note    PT Start Time  0934    PT Stop Time  1022    PT Time Calculation (min)  48 min       Past Medical History:  Diagnosis Date  . Anemia   . Arthritis    osteoarthritis - knees and right shoulder  . Atrial fibrillation [facility])    ablation- 2x's-- 1st time- [facility], 2nd event at [facility] in 2008. Convergent ablation at [facility] 6/14  . Atrial fibrillation ([place])   . Blood transfusion without reported diagnosis   . Breast cancer ([place])    Dr Margot Chimes, total thyroidectomy- 1999- for cancer  . 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 Chronic bilateral pleural effusions   . Colon polyp    Dr Earlean Shawl  . Complete heart block ([place])   . Complication of anesthesia    Ketamine produces LSD reaction, bright colored nightmarish experience   . Dyslipidemia   . Dyspnea   . Endometriosis   . Fibroids   . H/O pleural effusion    s/p thoracentesis w 3227m withdrawn  . Hepatitis    [place] ?hepatitis   . History of dysphagia    due to radiation therapy  . History of hiatal hernia    small noted on PET scan  . History of kidney stones   . Hx of thyroid cancer    Dr SForde Dandy . Hyperlipidemia   . Hypertension   . Hypothyroidism   . Lung cancer, lower lobe ([place] 01/2017   radiation RX completed 03/04/17; will start chemo 6/27, pt unaware of lung cancer  . Morbid obesity ([place]    Status post lap band surgery  . Nephrolithiasis   . Non Hodgkin's  lymphoma ([facility]    on chemotherapy  . Persistent atrial fibrillation    a. s/p PVI 2008 b. s/p convergent ablation [id number] by bradycardia requiring pacemaker implant  . Presence of permanent cardiac pacemaker   . Rotator cuff tear    Right  . Sinus node dysfunction ([place]    Complicating convergent ablation 6/14  . [place]    [place]  . SVC syndrome    with lung mass and non hodgkins lymphoma  . Thyroid cancer ([place] 2000    Past Surgical History:  Procedure Laterality Date  . ABDOMINAL HYSTERECTOMY  1983  . afib ablation     a. 2008 PVI b. 2014 convergent ablation  . APPENDECTOMY    . BONE MARROW BIOPSY  02/21/2017  . BREAST LUMPECTOMY Left 2010  . bso  1998  . CARDIAC CATHETERIZATION     2015- negative  . CARDIOVERSION  10/09/2012   Procedure: CARDIOVERSION;  Surgeon: JMinus Breeding MD;  [place] [facility]  Service: Cardiovascular;  Laterality: N/A;  . CARDIOVERSION  10/09/2012   Procedure: CARDIOVERSION;  Surgeon: JMinus Breeding MD;  [place] [facility]  Service: Cardiovascular;  Laterality: N/A;  [name]  the ok to add pt to the add on , but we must check to find out if the can add pt on at [phone] ( [phone])  . CARDIOVERSION N/A 11/20/2012   Procedure: CARDIOVERSION;  Surgeon: Fay Records, MD;  Location: [facility] ENDOSCOPY;  Service: Cardiovascular;  Laterality: N/A;  . CARDIOVERSION N/A 07/18/2017   Procedure: CARDIOVERSION;  Surgeon: Thayer Headings, MD;  Location: [facility] ENDOSCOPY;  Service: Cardiovascular;  Laterality: N/A;  . CARDIOVERSION N/A 10/03/2017   Procedure: CARDIOVERSION;  Surgeon: Sanda Klein, MD;  Location: [facility] ENDOSCOPY;  Service: Cardiovascular;  Laterality: N/A;  . CARDIOVERSION N/A 01/07/2018   Procedure: CARDIOVERSION;  Surgeon: Acie Fredrickson Wonda Cheng, MD;  Location: [place];  Service: Cardiovascular;  Laterality: N/A;  . CHOLECYSTECTOMY    . COLONOSCOPY W/ POLYPECTOMY     Dr Earlean Shawl  . CYSTOSCOPY N/A 02/06/2015   Procedure: CYSTOSCOPY;   Surgeon: Kathie Rhodes, MD;  [facility]: [facility];  Service: Urology;  Laterality: N/A;  . CYSTOSCOPY W/ RETROGRADES Left 11/17/2017   Procedure: CYSTOSCOPY WITH RETROGRADE /PYELOGRAM/;  Surgeon: Kathie Rhodes, MD;  [facility]: [facility];  Service: Urology;  Laterality: Left;  . CYSTOSCOPY WITH RETROGRADE PYELOGRAM, URETEROSCOPY AND STENT PLACEMENT Right 02/06/2015   Procedure: RETROGRADE PYELOGRAM, RIGHT URETEROSCOPY STENT PLACEMENT;  Surgeon: Kathie Rhodes, MD;  [facility];  Service: Urology;  Laterality: Right;  . CYSTOSCOPY WITH RETROGRADE PYELOGRAM, URETEROSCOPY AND STENT PLACEMENT Right 03/07/2017   Procedure: CYSTOSCOPY WITH RIGHT RETROGRADE PYELOGRAM,RIGHT  URETEROSCOPYLASER LITHOTRIPSY  AND STENT PLACEMENT AND STONE BASKETRY;  Surgeon: Kathie Rhodes, MD;  Location: [place];  Service: Urology;  Laterality: Right;  . EYE SURGERY     cataract surgery  . fatty mass removal  1999   pubic area  . HOLMIUM LASER APPLICATION N/A 5/92/9244   Procedure: HOLMIUM LASER APPLICATION;  Surgeon: Kathie Rhodes, MD;  [facility];  Service: Urology;  Laterality: N/A;  . HOLMIUM LASER APPLICATION Right 03/20/6380   Procedure: HOLMIUM LASER APPLICATION;  Surgeon: Kathie Rhodes, MD;  Location: [facility];  Service: Urology;  Laterality: Right;  . HOLMIUM LASER APPLICATION Left 7/71/1657   Procedure: HOLMIUM LASER APPLICATION;  Surgeon: Kathie Rhodes, MD;  [facility];  Service: Urology;  Laterality: Left;  . IR FLUORO GUIDE PORT INSERTION RIGHT  02/24/2017  . IR NEPHROSTOMY PLACEMENT RIGHT  11/17/2017  . IR PATIENT EVAL TECH 0-60 MINS  03/11/2017  . IR REMOVAL TUN ACCESS W/ PORT W/O FL MOD SED  04/20/2018  . IR US GUIDE VASC ACCESS RIGHT  02/24/2017  . KNEE ARTHROSCOPY     bilateral  . LAPAROSCOPIC GASTRIC BANDING  07/10/2010  . LAPAROSCOPIC GASTRIC BANDING     Laparoscopic adjustable banding APS System with posterior hiatal hernia, 2 suture.  Marland Kitchen LAPAROTOMY     for ruptured  ovary and ovarian artery   . NEPHROLITHOTOMY Right 11/17/2017   Procedure: NEPHROLITHOTOMY PERCUTANEOUS;  Surgeon: Kathie Rhodes, MD;  [facility];  Service: Urology;  Laterality: Right;  . PACEMAKER INSERTION  03/10/2013   MDT dual chamber PPM  . POCKET REVISION N/A 12/08/2013   Procedure: POCKET REVISION;  Surgeon: Deboraha Sprang, MD;  [facility] C[facility] LAB;  Service: Cardiovascular;  Laterality: N/A;  . PORTA CATH INSERTION    . REVERSE SHOULDER ARTHROPLASTY Right 05/14/2018   Procedure: RIGHT REVERSE SHOULDER ARTHROPLASTY;  Surgeon: Tania Ade, MD;  Location: [facility];  Service: Orthopedics;  Laterality: Right;  . REVERSE SHOULDER REPLACEMENT Right 05/14/2018  . TEE  WITH CARDIOVERSION  09/22/2017  . TEE WITHOUT CARDIOVERSION N/A 10/03/2017   Procedure: TRANSESOPHAGEAL ECHOCARDIOGRAM (TEE);  Surgeon: Sanda Klein, MD;  Location: [place];  Service: Cardiovascular;  Laterality: N/A;  . THYROIDECTOMY  1998   Dr Margot Chimes  . TONSILLECTOMY    . TOTAL KNEE ARTHROPLASTY  04/13/2012   Procedure: TOTAL KNEE ARTHROPLASTY;  Surgeon: Rudean Haskell, MD;  Location: [place];  Service: Orthopedics;  Laterality: Right;  Marland Kitchen VIDEO BRONCHOSCOPY WITH ENDOBRONCHIAL ULTRASOUND N/A 02/07/2017   Procedure: VIDEO BRONCHOSCOPY WITH ENDOBRONCHIAL ULTRASOUND;  Surgeon: Marshell Garfinkel, MD;  Location: [place];  Service: Pulmonary;  Laterality: N/A;    There were no vitals filed for this visit.  Subjective Assessment - 11/03/18 0944    Currently in Pain?  No/denies         [facility] PT Assessment - 11/03/18 1035      Functional Gait  Assessment   Gait assessed   Yes    Gait Level Surface  Walks 20 ft in less than 7 sec but greater than 5.5 sec, uses assistive device, slower speed, mild gait deviations, or deviates 6-10 in outside of the 12 in walkway width.   5.68 secs without cane   Change in Gait Speed  Able to smoothly change walking speed without loss of balance or gait deviation. Deviate no more than 6  in outside of the 12 in walkway width.    Gait with Horizontal Head Turns  Performs head turns smoothly with slight change in gait velocity (eg, minor disruption to smooth gait path), deviates 6-10 in outside 12 in walkway width, or uses an assistive device.    Gait with Vertical Head Turns  Performs task with slight change in gait velocity (eg, minor disruption to smooth gait path), deviates 6 - 10 in outside 12 in walkway width or uses assistive device    Gait and Pivot Turn  Pivot turns safely within 3 sec and stops quickly with no loss of balance.    Step Over Obstacle  Is able to step over one shoe box (4.5 in total height) without changing gait speed. No evidence of imbalance.    Gait with Narrow Base of Support  Ambulates less than 4 steps heel to toe or cannot perform without assistance.    Gait with Eyes Closed  Walks 20 ft, uses assistive device, slower speed, mild gait deviations, deviates 6-10 in outside 12 in walkway width. Ambulates 20 ft in less than 9 sec but greater than 7 sec.    Ambulating Backwards  Walks 20 ft, no assistive devices, good speed, no evidence for imbalance, normal gait    Steps  Alternating feet, must use rail.    Total Score  21    FGA comment:  pt was fatigued prior to start of test which impacted scores of gait with horizontal & vertical head turns                   [facility] Adult PT Treatment/Exercise - 11/03/18 1035      Transfers   Transfers  Sit to Stand    Five time sit to stand comments   9.12 secs without UE support from high/low mat table    Number of Reps  Other reps (comment)   5   Comments  feet on floor      Ambulation/Gait   Ambulation/Gait  Yes    Ambulation/Gait Assistance  5: Supervision    Ambulation/Gait Assistance Details  no cane used  Ambulation Distance (Feet)  240 Feet   prior to fatigue; incr. Lt Trendelenburg gait noted w/fatigu   Assistive device  None    Gait Pattern  Step-through pattern    Ambulation Surface   Level;Indoor    Stairs  Yes    Stairs Assistance  5: Supervision    Stairs Assistance Details (indicate cue type and reason)  no rail used with ascension using step over step sequence; rail used with descension using step by step sequence    Stair Management Technique  One rail Right;No rails;Alternating pattern;Forwards    Number of Stairs  4    Height of Stairs  6      Therapeutic Activites    Therapeutic Activities  Other Therapeutic Activities    Other Therapeutic Activities  Floor to stand transfer with min assist with side sitting to tall kneeling; min assist with 1/2 kneeling to standing position                                                              PT Short Term Goals - 10/06/18 1147      PT SHORT TERM GOAL #1   Title  Pt will be independent with initial HEP in order to indicate decreased fall risk and improved functional mobility (Target Date: 09/20/18)    Baseline  met per verbal report    Time  5    Period  Weeks    Status  Achieved      PT SHORT TERM GOAL #2   Title  Pt will perform DGI and improve score 3 points from baseline in order to indicate decreased fall risk.      Baseline  15/24 on 12/10 to 22/24 on 12/31    Time  5    Period  Weeks    Status  Achieved      PT SHORT TERM GOAL #3   Title  Pt will improve gait speed to >/=2.62 ft/sec w/ LRAD in order to indicate safe community ambulator.     Baseline  3.50 ft/sec with cane, 3.64 ft/sec without cane on 09/22/18    Time  5    Period  Weeks    Status  Achieved      PT SHORT TERM GOAL #4   Title  Will assess need for AFO/support for LLE and request order as appropriate.      Baseline  deferred at this time, no need     Time  5    Period  Weeks    Status  Deferred      PT SHORT TERM GOAL #5   Title  Will assess 6MWT and improve distance by 150' in order to indicate improved functional endurance.     Baseline  859' on 12/10, 1010' on 12/31    Time  5    Period  Weeks    Status  Achieved       PT SHORT TERM GOAL #6   Title  Pt will perform floor recovery at mod A level in order to indiciate improved ability to get up in case of fall.      Baseline  min A on 09/22/18    Time  5    Period  Weeks    Status  Achieved  PT SHORT TERM GOAL #7   Title  Pt will negotiate up/down curb step with LRAD at mod I level in order to indicate safe community negotiation.     Baseline  met 10/06/18    Time  5    Period  Weeks    Status  Achieved        PT Long Term Goals - 11/03/18 1049      PT LONG TERM GOAL #1   Title  Pt will be independent with final HEP in order to indicate decreased fall risk and improved functional mobility.  (Target Date: 10/20/18    Baseline  On-going - pt has not yet participated in aquatic therapy     Status  On-going      PT LONG TERM GOAL #2   Title  Pt will perform 5TSS in <11 secs without UE support and without using backs of legs on seated surface in order to indicate improved BLE strength.      Baseline  9.12 secs on 11-03-18    Status  Achieved      PT LONG TERM GOAL #3   Title  Pt will ambulate x 500' w/ LRAD over unlevel outdoor paved surfaces at mod I level in order to indicate safe community mobility.        PT LONG TERM GOAL #4   Title  Pt will improve FGA to >/=25/30 in order to indicate decreased fall risk.     Baseline  21/30 on 11-03-18    Status  On-going      PT LONG TERM GOAL #5   Title  Pt will perform floor recovery at mod I level in order to indicate improved ability to get up from floor in case of fall.     Baseline  min assist on 11-03-18    Status  On-going      PT LONG TERM GOAL #6   Title  Pt will improve 6MWT by 300' from baseline in order to indicate improved functional endurance.             Plan - 11/03/18 1052    Clinical Impression Statement  Pt has met LTG #2:  LTG's # 1, 4, and 5 remain on-going as not achieved as of this time;  pt has missed 2 weeks of PT due to respiratory illness. Pt is progressing with  balance and gait but continues to fatigue quickly; pt is ambulating short distances without use of cane safely.      Rehab Potential  Good    Clinical Impairments Affecting Rehab Potential  neuropathy, uncertain of reason for adipose tissue in BLEs, chronic small vessel disease    PT Frequency  2x / week    PT Duration  8 weeks    PT Treatment/Interventions  ADLs/Self Care Home Management;Aquatic Therapy;DME Instruction;Gait training;Stair training;Functional mobility training;Therapeutic activities;Therapeutic exercise;Balance training;Neuromuscular re-education;Patient/family education;Orthotic Fit/Training;Scar mobilization;Passive range of motion;Dry needling;Vestibular    PT Next Visit Plan  Check LTG's #3 and 6 and complete renewal (2x/week for 6 weeks discussed with pt on 11-03-18); continue with Lt hip strengthening, balance and gait training    PT Home Exercise Plan  [email or web address]    Consulted and Agree with Plan of Care  Patient       Patient will benefit from skilled therapeutic intervention in order to improve the following deficits and impairments:  Decreased activity tolerance, Decreased balance, Decreased coordination, Decreased endurance, Decreased knowledge of use of DME, Decreased mobility, Decreased range of motion,  Decreased strength, Difficulty walking, Dizziness, Hypomobility, Impaired flexibility, Impaired perceived functional ability, Impaired sensation, Impaired UE functional use, Postural dysfunction  Visit Diagnosis: Unsteadiness on feet  Muscle weakness (generalized)  Other abnormalities of gait and mobility     Problem List Patient Active Problem List   Diagnosis Date Noted  . Gait abnormality 07/10/2018  . S/P reverse total shoulder arthroplasty, 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  . Persistent atrial fibrillation   . Bilateral ureteral calculi 11/17/2017  . Atrial fibrillation with RVR ([place]) 09/15/2017  . Atrial flutter ([place]) 07/17/2017  . Port catheter in place 04/02/2017  .  Non-Hodgkin lymphoma, unspecified, intrathoracic lymph nodes ([place]) 02/13/2017  . Mediastinal mass 02/06/2017  . 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 Abnormal chest x-ray   . Lung mass 02/03/2017  . 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  . OSA (obstructive sleep apnea) 02/03/2015  . History of renal calculi 11/16/2014  . Renal calculi 11/16/2014  . Family history of colon cancer 10/26/2014  . Mechanical complication due to cardiac pacemaker pulse generator 11/06/2013  . Dyspnea 05/12/2013  . Hypothyroidism 04/21/2013  . Pleural effusion 04/19/2013  . Long term (current) use of anticoagulants 12/20/2010  . EPIDERMOID CYST 08/22/2010  . Chronic diastolic heart failure ([place]) 08/06/2010  . SYNCOPE AND COLLAPSE 07/27/2010  . OSTEOARTHRITIS, KNEE, RIGHT 03/15/2010  . Morbid obesity ([place]) 12/07/2009  . NONSPEC ELEVATION OF LEVELS OF TRANSAMINASE/LDH 09/08/2009  . BREAST CANCER, HX OF 07/25/2009  . COLONIC POLYPS, HX OF 07/25/2009  . TUBULOVILLOUS ADENOMA, COLON 04/29/2008  . HYPERGLYCEMIA, FASTING 04/29/2008  . HYPERLIPIDEMIA 06/22/2007  . CARCINOMA, THYROID GLAND, HX OF 06/22/2007    Alda Lea, PT 11/03/2018, [facility] [address] [place], [place], [address] Phone: [phone]   Fax:  [phone]  Name: Kayla Harrison MRN: [id number] Date of Birth: 1945-11-08

## 2018-11-05 NOTE — Progress Notes (Signed)
Remote pacemaker transmission.   

## 2018-11-10 ENCOUNTER — Ambulatory Visit: Payer: Medicare Other | Admitting: Rehabilitation

## 2018-11-10 ENCOUNTER — Encounter: Payer: Self-pay | Admitting: Rehabilitation

## 2018-11-10 DIAGNOSIS — R42 Dizziness and giddiness: Secondary | ICD-10-CM

## 2018-11-10 DIAGNOSIS — R2681 Unsteadiness on feet: Secondary | ICD-10-CM | POA: Diagnosis not present

## 2018-11-10 DIAGNOSIS — R2689 Other abnormalities of gait and mobility: Secondary | ICD-10-CM | POA: Diagnosis not present

## 2018-11-10 DIAGNOSIS — M6281 Muscle weakness (generalized): Secondary | ICD-10-CM

## 2018-11-10 DIAGNOSIS — R293 Abnormal posture: Secondary | ICD-10-CM

## 2018-11-10 DIAGNOSIS — R296 Repeated falls: Secondary | ICD-10-CM | POA: Diagnosis not present

## 2018-11-10 NOTE — Patient Instructions (Signed)
Access Code: [email or web address]  URL: [email or web address]  Date: 11/10/2018  Prepared by: Cameron Sprang   Exercises  Sit to Stand with Arms Crossed - 10 reps - 1 sets - 2x daily - 7x weekly  Supine Straight Leg Raises - 10 reps - 3 sets - 1x daily - 7x weekly  Clamshell - 10 reps - 1 sets - 2x daily - 7x weekly  Sidelying Hip Extension in Abduction - 10 reps - 1 sets - 2x daily - 7x weekly  Supine Figure 4 Piriformis Stretch - 3 reps - 1 sets - 15-20 hold - 1x daily - 7x weekly  Supine ITB Stretch with Strap - 3 reps - 1 sets - 15-20 hold - 1-2x daily - 7x weekly  Supine Hip Flexion - 5 reps - 2 sets - 1x daily - 7x weekly  Prone Hip Extension Sequence - 5 reps - 2 sets - 1x daily - 7x weekly  Romberg Stance Eyes Closed on Foam Pad - 3 reps - 1 sets - 20 hold - 1-2x daily - 7x weekly  Prone Hip Extension with Bent Knee - 5 reps - 2 sets - 1x daily - 7x weekly  Romberg Stance with Head Nods on Foam Pad - 10 reps - 1 sets - 1x daily - 7x weekly  Romberg Stance on Foam Pad with Head Rotation - 10 reps - 1 sets - 1x daily - 7x weekly

## 2018-11-10 NOTE — Therapy (Signed)
[facility] [address] [place], [place], [address] Phone: [phone]   Fax:  [phone]  Physical Therapy Treatment  Patient Details  Name: Kayla Harrison MRN: [id number] Date of Birth: 1946-04-18 Referring Provider (PT): Sarina Ill, MD   Encounter Date: 11/10/2018  PT End of Session - 11/10/18 1248    Visit Number  17    Number of Visits  29   per updated POC   Date for PT Re-Evaluation  67/59/16   cert written for 60 days, only plan to see for 45 days   Authorization Type  Medicare and [employer]-needs 10th visit progress note    PT Start Time  1016    PT Stop Time  1107    PT Time Calculation (min)  51 min    Activity Tolerance  Patient tolerated treatment well    Behavior During Therapy  [facility] for tasks assessed/performed       Past Medical History:  Diagnosis Date  . Anemia   . Arthritis    osteoarthritis - knees and right shoulder  . Atrial fibrillation [facility])    ablation- 2x's-- 1st time- [facility], 2nd event at [facility] in 2008. Convergent ablation at [facility] 6/14  . Atrial fibrillation ([place])   . Blood transfusion without reported diagnosis   . Breast cancer ([place])    Dr Margot Chimes, total thyroidectomy- 1999- for cancer  . 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 Chronic bilateral pleural effusions   . Colon polyp    Dr Earlean Shawl  . Complete heart block ([place])   . Complication of anesthesia    Ketamine produces LSD reaction, bright colored nightmarish experience   . Dyslipidemia   . Dyspnea   . Endometriosis   . Fibroids   . H/O pleural effusion    s/p thoracentesis w 3254m withdrawn  . Hepatitis    [place] ?hepatitis   . History of dysphagia    due to radiation therapy  . History of hiatal hernia    small noted on PET scan  . History of kidney stones   . Hx of thyroid cancer    Dr SForde Dandy . Hyperlipidemia   . Hypertension   . Hypothyroidism   . Lung cancer, lower lobe  ([place] 01[place]2017   radiation RX completed 03/04/17; will start chemo 6/27, pt unaware of lung cancer  . Morbid obesity ([place]    Status post lap band surgery  . Nephrolithiasis   . Non Hodgkin's lymphoma ([place]    on chemotherapy  . Persistent atrial fibrillation    a. s/p PVI 2008 b. s/p convergent ablation [id number] by bradycardia requiring pacemaker implant  . Presence of permanent cardiac pacemaker   . Rotator cuff tear    Right  . Sinus node dysfunction ([place]    Complicating convergent ablation 6/14  . St[place]    [place]  . SVC syndrome    with lung mass and non hodgkins lymphoma  . Thyroid cancer ([place] 2000    Past Surgical History:  Procedure Laterality Date  . ABDOMINAL HYSTERECTOMY  1983  . afib ablation     a. 2008 PVI b. 2014 convergent ablation  . APPENDECTOMY    . BONE MARROW BIOPSY  02/21/2017  . BREAST LUMPECTOMY Left 2010  . bso  1998  . CARDIAC CATHETERIZATION     2015- negative  . CARDIOVERSION  10/09/2012   Procedure: CARDIOVERSION;  Surgeon: JMinus Breeding  MD;  Location: [place];  Service: Cardiovascular;  Laterality: N/A;  . CARDIOVERSION  10/09/2012   Procedure: CARDIOVERSION;  Surgeon: Minus Breeding, MD;  Location: [facility] ENDOSCOPY;  Service: Cardiovascular;  Laterality: N/A;  [name] gave the ok to add pt to the add on , but we must check to find out if the can add pt on at [phone] ( [phone])  . CARDIOVERSION N/A 11/20/2012   Procedure: CARDIOVERSION;  Surgeon: Fay Records, MD;  Location: [facility] ENDOSCOPY;  Service: Cardiovascular;  Laterality: N/A;  . CARDIOVERSION N/A 07/18/2017   Procedure: CARDIOVERSION;  Surgeon: Thayer Headings, MD;  Location: [facility] ENDOSCOPY;  Service: Cardiovascular;  Laterality: N/A;  . CARDIOVERSION N/A 10/03/2017   Procedure: CARDIOVERSION;  Surgeon: Sanda Klein, MD;  Location: [facility] ENDOSCOPY;  Service: Cardiovascular;  Laterality: N/A;  . CARDIOVERSION N/A 01/07/2018   Procedure: CARDIOVERSION;  Surgeon: Acie Fredrickson Wonda Cheng, MD;   Location: [place];  Service: Cardiovascular;  Laterality: N/A;  . CHOLECYSTECTOMY    . COLONOSCOPY W/ POLYPECTOMY     Dr Earlean Shawl  . CYSTOSCOPY N/A 02/06/2015   Procedure: CYSTOSCOPY;  Surgeon: Kathie Rhodes, MD;  Location: [facility];  Service: Urology;  Laterality: N/A;  . CYSTOSCOPY W/ RETROGRADES Left 11/17/2017   Procedure: CYSTOSCOPY WITH RETROGRADE /PYELOGRAM/;  Surgeon: Kathie Rhodes, MD;  Location: [facility];  Service: Urology;  Laterality: Left;  . CYSTOSCOPY WITH RETROGRADE PYELOGRAM, URETEROSCOPY AND STENT PLACEMENT Right 02/06/2015   Procedure: RETROGRADE PYELOGRAM, RIGHT URETEROSCOPY STENT PLACEMENT;  Surgeon: Kathie Rhodes, MD;  [facility]: [facility];  Service: Urology;  Laterality: Right;  . CYSTOSCOPY WITH RETROGRADE PYELOGRAM, URETEROSCOPY AND STENT PLACEMENT Right 03/07/2017   Procedure: CYSTOSCOPY WITH RIGHT RETROGRADE PYELOGRAM,RIGHT  URETEROSCOPYLASER LITHOTRIPSY  AND STENT PLACEMENT AND STONE BASKETRY;  Surgeon: Kathie Rhodes, MD;  Location: [place];  Service: Urology;  Laterality: Right;  . EYE SURGERY     cataract surgery  . fatty mass removal  1999   pubic area  . HOLMIUM LASER APPLICATION N/A 03/24/6377   Procedure: HOLMIUM LASER APPLICATION;  Surgeon: Kathie Rhodes, MD;  [facility];  Service: Urology;  Laterality: N/A;  . HOLMIUM LASER APPLICATION Right 5/88/5027   Procedure: HOLMIUM LASER APPLICATION;  Surgeon: Kathie Rhodes, MD;  Location: [facility];  Service: Urology;  Laterality: Right;  . HOLMIUM LASER APPLICATION Left 7/41/2878   Procedure: HOLMIUM LASER APPLICATION;  Surgeon: Kathie Rhodes, MD;  [facility];  Service: Urology;  Laterality: Left;  . IR FLUORO GUIDE PORT INSERTION RIGHT  02/24/2017  . IR NEPHROSTOMY PLACEMENT RIGHT  11/17/2017  . IR PATIENT EVAL TECH 0-60 MINS  03/11/2017  . IR REMOVAL TUN ACCESS W/ PORT W/O FL MOD SED  04/20/2018  . IR US GUIDE VASC ACCESS RIGHT  02/24/2017  . KNEE ARTHROSCOPY     bilateral  .  LAPAROSCOPIC GASTRIC BANDING  07/10/2010  . LAPAROSCOPIC GASTRIC BANDING     Laparoscopic adjustable banding APS System with posterior hiatal hernia, 2 suture.  Marland Kitchen LAPAROTOMY     for ruptured ovary and ovarian artery   . NEPHROLITHOTOMY Right 11/17/2017   Procedure: NEPHROLITHOTOMY PERCUTANEOUS;  Surgeon: Kathie Rhodes, MD;  [facility];  Service: Urology;  Laterality: Right;  . PACEMAKER INSERTION  03/10/2013   MDT dual chamber PPM  . POCKET REVISION N/A 12/08/2013   Procedure: POCKET REVISION;  Surgeon: Deboraha Sprang, MD;  Location[facility] CATH LAB;  Service: Cardiovascular;  Laterality: N/A;  . PORTA CATH INSERTION    .  REVERSE SHOULDER ARTHROPLASTY Right 05/14/2018   Procedure: RIGHT REVERSE SHOULDER ARTHROPLASTY;  Surgeon: Tania Ade, MD;  Location: [place];  Service: Orthopedics;  Laterality: Right;  . REVERSE SHOULDER REPLACEMENT Right 05/14/2018  . TEE WITH CARDIOVERSION  09/22/2017  . TEE WITHOUT CARDIOVERSION N/A 10/03/2017   Procedure: TRANSESOPHAGEAL ECHOCARDIOGRAM (TEE);  Surgeon: Sanda Klein, MD;  Location: [place];  Service: Cardiovascular;  Laterality: N/A;  . THYROIDECTOMY  1998   Dr Margot Chimes  . TONSILLECTOMY    . TOTAL KNEE ARTHROPLASTY  04/13/2012   Procedure: TOTAL KNEE ARTHROPLASTY;  Surgeon: Rudean Haskell, MD;  Location: [place];  Service: Orthopedics;  Laterality: Right;  Marland Kitchen VIDEO BRONCHOSCOPY WITH ENDOBRONCHIAL ULTRASOUND N/A 02/07/2017   Procedure: VIDEO BRONCHOSCOPY WITH ENDOBRONCHIAL ULTRASOUND;  Surgeon: Marshell Garfinkel, MD;  Location: [place];  Service: Pulmonary;  Laterality: N/A;    There were no vitals filed for this visit.  Subjective Assessment - 11/10/18 1230    Subjective  Pt reports that her endurance has just not been the same since being sick.     Pertinent History  lymphoma (had chemotherapy), vertigo, small vessel disease, arthritis (has had R knee and reverse total shoulder on R)    Limitations  Walking;House hold activities    Patient  Stated Goals  "I want to be able to walk in a normal fashion"     Currently in Pain?  No/denies         [facility] PT Assessment - 11/10/18 1054      6 Minute Walk- Baseline   6 Minute Walk- Baseline  yes    BP (mmHg)  (!) 146/98    HR (bpm)  72    02 Sat (%RA)  98 %    Modified Borg Scale for Dyspnea  0- Nothing at all    Perceived Rate of Exertion (Borg)  6-      6 Minute walk- Post Test   6 Minute Walk Post Test  yes    BP (mmHg)  (!) 163/95    HR (bpm)  96    02 Sat (%RA)  92 %    Modified Borg Scale for Dyspnea  3- Moderate shortness of breath or breathing difficulty    Perceived Rate of Exertion (Borg)  13- Somewhat hard      6 minute walk test results    Endurance additional comments  1008                   [facility] Adult PT Treatment/Exercise - 11/10/18 1020      Ambulation/Gait   Ambulation/Gait  Yes    Ambulation/Gait Assistance  5: Supervision    Ambulation/Gait Assistance Details  Pt performed outdoor gait for LTG assessment without cane x 500' over unlevel paved surfaces.  Pt mostly steady, however did note overt trunk/hip compensations with increased fatigue.      Ambulation Distance (Feet)  500 Feet    Assistive device  None    Gait Pattern  Step-through pattern    Ambulation Surface  Level;Unlevel;Outdoor;Paved      High Level Balance   High Level Balance Comments  Reviewed corner balance exercises with use of her yoga mat. Performed with mat folded once with feet apart EC x 20 secs, feet apart with EO performing head turns x 10, nods x 10 reps.  These seem to have gotten easier, therefore had pt fold yoga mat again to increase compliance and also had her perform exercises just mentioned.  No overt postural  sway noted, therefore had her perform feet together EC x 20 secs-this caused some LOB, therefore had her seperate feet slightly and perform EC x 20 secs.  Moderate postural sway noted.  Also performed feet together EO with head motion up/down x 10 reps,  side/side x 10 reps again with moderate sway-updated HEP, see pt instruction.        Exercises   Other Exercises   Pt with question regarding hip/knee flex from mat lowering to floor and elevating back to mat during session.  She reports it "doesn't hurt anymore."  Upon having her perform, she needed cues to keep knee flexed throughout and to decrease L hip abd. Pt able to return demo correctly with adequate muscle fatigue.         Access Code: [email or web address]  URL: [email or web address]  Date: 11/10/2018  Prepared by: Cameron Sprang   Exercises  Sit to Stand with Arms Crossed - 10 reps - 1 sets - 2x daily - 7x weekly  Supine Straight Leg Raises - 10 reps - 3 sets - 1x daily - 7x weekly  Clamshell - 10 reps - 1 sets - 2x daily - 7x weekly  Sidelying Hip Extension in Abduction - 10 reps - 1 sets - 2x daily - 7x weekly  Supine Figure 4 Piriformis Stretch - 3 reps - 1 sets - 15-20 hold - 1x daily - 7x weekly  Supine ITB Stretch with Strap - 3 reps - 1 sets - 15-20 hold - 1-2x daily - 7x weekly  Supine Hip Flexion - 5 reps - 2 sets - 1x daily - 7x weekly  Prone Hip Extension Sequence - 5 reps - 2 sets - 1x daily - 7x weekly  Romberg Stance Eyes Closed on Foam Pad - 3 reps - 1 sets - 20 hold - 1-2x daily - 7x weekly  Prone Hip Extension with Bent Knee - 5 reps - 2 sets - 1x daily - 7x weekly  Romberg Stance with Head Nods on Foam Pad - 10 reps - 1 sets - 1x daily - 7x weekly  Romberg Stance on Foam Pad with Head Rotation - 10 reps - 1 sets - 1x daily - 7x weekly       PT Education - 11/10/18 1241    Education Details  updates to HEP balance     Person(s) Educated  Patient    Methods  Explanation;Demonstration;Handout    Comprehension  Verbalized understanding;Returned demonstration       PT Short Term Goals - 10/06/18 1147      PT SHORT TERM GOAL #1   Title  Pt will be independent with initial HEP in order to indicate decreased fall risk and improved functional mobility  (Target Date: 09/20/18)    Baseline  met per verbal report    Time  5    Period  Weeks    Status  Achieved      PT SHORT TERM GOAL #2   Title  Pt will perform DGI and improve score 3 points from baseline in order to indicate decreased fall risk.      Baseline  15/24 on 12/10 to 22/24 on 12/31    Time  5    Period  Weeks    Status  Achieved      PT SHORT TERM GOAL #3   Title  Pt will improve gait speed to >/=2.62 ft/sec w/ LRAD in order to indicate safe community ambulator.     Baseline  3.50 ft/sec  with cane, 3.64 ft/sec without cane on 09/22/18    Time  5    Period  Weeks    Status  Achieved      PT SHORT TERM GOAL #4   Title  Will assess need for AFO/support for LLE and request order as appropriate.      Baseline  deferred at this time, no need     Time  5    Period  Weeks    Status  Deferred      PT SHORT TERM GOAL #5   Title  Will assess 6MWT and improve distance by 150' in order to indicate improved functional endurance.     Baseline  859' on 12/10, 1010' on 12/31    Time  5    Period  Weeks    Status  Achieved      PT SHORT TERM GOAL #6   Title  Pt will perform floor recovery at mod A level in order to indiciate improved ability to get up in case of fall.      Baseline  min A on 09/22/18    Time  5    Period  Weeks    Status  Achieved      PT SHORT TERM GOAL #7   Title  Pt will negotiate up/down curb step with LRAD at mod I level in order to indicate safe community negotiation.     Baseline  met 10/06/18    Time  5    Period  Weeks    Status  Achieved        PT Long Term Goals - 11/10/18 1025      PT LONG TERM GOAL #1   Title  Pt will be independent with final HEP in order to indicate decreased fall risk and improved functional mobility.  (Target Date: 10/20/18    Baseline  On-going - pt has not yet participated in aquatic therapy     Status  On-going      PT LONG TERM GOAL #2   Title  Pt will perform 5TSS in <11 secs without UE support and without using  backs of legs on seated surface in order to indicate improved BLE strength.      Baseline  9.12 secs on 11-03-18    Status  Achieved      PT LONG TERM GOAL #3   Title  Pt will ambulate x 500' w/ LRAD over unlevel outdoor paved surfaces at mod I level in order to indicate safe community mobility.      Baseline  met but with gait compensations (Trendelenburg)    Status  Achieved      PT LONG TERM GOAL #4   Title  Pt will improve FGA to >/=25/30 in order to indicate decreased fall risk.     Baseline  21/30 on 11-03-18    Status  On-going      PT LONG TERM GOAL #5   Title  Pt will perform floor recovery at mod I level in order to indicate improved ability to get up from floor in case of fall.     Baseline  min assist on 11-03-18    Status  On-going      PT LONG TERM GOAL #6   Title  Pt will improve 6MWT by 300' from baseline in order to indicate improved functional endurance.         PT Long Term Goals - 11/10/18 1025      PT LONG  TERM GOAL #1   Title  Pt will be independent with final HEP in order to indicate decreased fall risk and improved functional mobility.  (Target Date: 12/25/2018)    Baseline  On-going - pt has not yet participated in aquatic therapy     Status  On-going    Target Date  12/25/18      PT LONG TERM GOAL #2   Title  --    Baseline  --    Status  --      PT LONG TERM GOAL #3   Title  Pt will ambulate x 1000' w/o AD over unlevel outdoor paved surfaces at mod I level with decreased gait compensations in order to indicate safe community mobility.      Baseline  met but with gait compensations (Trendelenburg)    Status  Revised      PT LONG TERM GOAL #4   Title  Pt will improve FGA to >/=25/30 in order to indicate decreased fall risk.     Baseline  21/30 on 11-03-18    Status  On-going      PT LONG TERM GOAL #5   Title  Pt will perform floor recovery at mod I level in order to indicate improved ability to get up from floor in case of fall.     Baseline  min  assist on 11-03-18    Status  On-going      PT LONG TERM GOAL #6   Title  Pt will improve 6MWT by 300' from baseline in order to indicate improved functional endurance.     Baseline  1008' on 11/10/2018    Status  On-going            Plan - 11/10/18 1248    Clinical Impression Statement  Pt met LTG for outdoor gait today, however would like to see her be able to ambulate longer with less gait compensation.  She did not meet goal for 6MWT, however she did remain the same from STG assessment.  Will re-cert for 2x/wk for 6 more weeks to address remaining deficits.      Rehab Potential  Good    Clinical Impairments Affecting Rehab Potential  neuropathy, uncertain of reason for adipose tissue in BLEs, chronic small vessel disease    PT Frequency  2x / week    PT Duration  6 weeks    PT Treatment/Interventions  ADLs/Self Care Home Management;Aquatic Therapy;DME Instruction;Gait training;Stair training;Functional mobility training;Therapeutic activities;Therapeutic exercise;Balance training;Neuromuscular re-education;Patient/family education;Orthotic Fit/Training;Scar mobilization;Passive range of motion;Dry needling;Vestibular;Manual techniques    PT Next Visit Plan  Give HEP handout from last session, continue floor recovery, balance, stepping strategy; continue with Lt hip strengthening, balance and gait training    PT Home Exercise Plan  [email or web address]    Consulted and Agree with Plan of Care  Patient       Patient will benefit from skilled therapeutic intervention in order to improve the following deficits and impairments:  Decreased activity tolerance, Decreased balance, Decreased coordination, Decreased endurance, Decreased knowledge of use of DME, Decreased mobility, Decreased range of motion, Decreased strength, Difficulty walking, Dizziness, Hypomobility, Impaired flexibility, Impaired perceived functional ability, Impaired sensation, Impaired UE functional use, Postural dysfunction  Visit  Diagnosis: Unsteadiness on feet  Muscle weakness (generalized)  Other abnormalities of gait and mobility  Abnormal posture  Dizziness and giddiness     Problem List Patient Active Problem List   Diagnosis Date Noted  . Gait abnormality 07/10/2018  . S/P reverse total  shoulder arthroplasty, 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  . Persistent atrial fibrillation   . Bilateral ureteral calculi 11/17/2017  . Atrial fibrillation with RVR ([place]) 09/15/2017  . Atrial flutter ([place]) 07/17/2017  . Port catheter in place 04/02/2017  . Non-Hodgkin lymphoma, unspecified, intrathoracic lymph nodes ([place]) 02/13/2017  . Mediastinal mass 02/06/2017  . 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 Abnormal chest x-ray   . Lung mass 02/03/2017  . 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  . OSA (obstructive sleep apnea) 02/03/2015  . History of renal calculi 11/16/2014  . Renal calculi 11/16/2014  . Family history of colon cancer 10/26/2014  . Mechanical complication due to cardiac pacemaker pulse generator 11/06/2013  . Dyspnea 05/12/2013  . Hypothyroidism 04/21/2013  . Pleural effusion 04/19/2013  . Long term (current) use of anticoagulants 12/20/2010  . EPIDERMOID CYST 08/22/2010  . Chronic diastolic heart failure ([place]) 08/06/2010  . SYNCOPE AND COLLAPSE 07/27/2010  . OSTEOARTHRITIS, KNEE, RIGHT 03/15/2010  . Morbid obesity ([place]) 12/07/2009  . NONSPEC ELEVATION OF LEVELS OF TRANSAMINASE/LDH 09/08/2009  . BREAST CANCER, HX OF 07/25/2009  . COLONIC POLYPS, HX OF 07/25/2009  . TUBULOVILLOUS ADENOMA, COLON 04/29/2008  . HYPERGLYCEMIA, FASTING 04/29/2008  . HYPERLIPIDEMIA 06/22/2007  . CARCINOMA, THYROID GLAND, HX OF 06/22/2007    Denice Bors 11/10/2018, 12:54 PM  [facility] [address] [place], [place], [address] Phone: [phone]   Fax:  [phone]  Name: Kayla Harrison MRN: [id number] Date of Birth: Dec 29, 1945

## 2018-11-11 DIAGNOSIS — G5763 Lesion of plantar nerve, bilateral lower limbs: Secondary | ICD-10-CM | POA: Diagnosis not present

## 2018-11-12 ENCOUNTER — Ambulatory Visit: Payer: Medicare Other | Admitting: Rehabilitation

## 2018-11-12 ENCOUNTER — Encounter: Payer: Self-pay | Admitting: Rehabilitation

## 2018-11-12 DIAGNOSIS — R2681 Unsteadiness on feet: Secondary | ICD-10-CM

## 2018-11-12 DIAGNOSIS — M6281 Muscle weakness (generalized): Secondary | ICD-10-CM | POA: Diagnosis not present

## 2018-11-12 DIAGNOSIS — R42 Dizziness and giddiness: Secondary | ICD-10-CM | POA: Diagnosis not present

## 2018-11-12 DIAGNOSIS — R2689 Other abnormalities of gait and mobility: Secondary | ICD-10-CM

## 2018-11-12 DIAGNOSIS — R296 Repeated falls: Secondary | ICD-10-CM

## 2018-11-12 DIAGNOSIS — R293 Abnormal posture: Secondary | ICD-10-CM | POA: Diagnosis not present

## 2018-11-12 NOTE — Therapy (Signed)
[facility] [address] [place], [place], [address] Phone: [phone]   Fax:  [phone]  Physical Therapy Treatment  Patient Details  Name: Kayla Harrison MRN: [id number] Date of Birth: 06-10-1946 Referring Provider (PT): Sarina Ill, MD   Encounter Date: 11/12/2018  PT End of Session - 11/12/18 1437    Visit Number  18    Number of Visits  29   per updated POC   Date for PT Re-Evaluation  25/42/70   cert written for 60 days, only plan to see for 45 days   Authorization Type  Medicare and [employer]-needs 10th visit progress note    PT Start Time  1316    PT Stop Time  1401    PT Time Calculation (min)  45 min    Activity Tolerance  Patient tolerated treatment well    Behavior During Therapy  [facility] for tasks assessed/performed       Past Medical History:  Diagnosis Date  . Anemia   . Arthritis    osteoarthritis - knees and right shoulder  . Atrial fibrillation [facility])    ablation- 2x's-- 1st time- [facility], 2nd event at [facility] in 2008. Convergent ablation at [facility] 6/14  . Atrial fibrillation ([place])   . Blood transfusion without reported diagnosis   . Breast cancer ([place])    Dr Margot Chimes, total thyroidectomy- 1999- for cancer  . 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 Chronic bilateral pleural effusions   . Colon polyp    Dr Earlean Shawl  . Complete heart block ([place])   . Complication of anesthesia    Ketamine produces LSD reaction, bright colored nightmarish experience   . Dyslipidemia   . Dyspnea   . Endometriosis   . Fibroids   . H/O pleural effusion    s/p thoracentesis w 3229m withdrawn  . Hepatitis    [place]hepatitis   . History of dysphagia    due to radiation therapy  . History of hiatal hernia    small noted on PET scan  . History of kidney stones   . Hx of thyroid cancer    Dr SForde Dandy . Hyperlipidemia   . Hypertension   . Hypothyroidism   . Lung cancer, lower lobe  [place] 01[place]   radiation RX completed 03/04/17; will start chemo 6/27, pt unaware of lung cancer  . Morbid obesity ([place]    Status post lap band surgery  . Nephrolithiasis   . Non Hodgkin's lymphoma ([place]    on chemotherapy  . Persistent atrial fibrillation    a. s/p PVI 2008 b. s/p convergent ablation [id number] by bradycardia requiring pacemaker implant  . Presence of permanent cardiac pacemaker   . Rotator cuff tear    Right  . Sinus node dysfunction ([place]    Complicating convergent ablation 6/14  . [place]    [place]  . SVC syndrome    with lung mass and non hodgkins lymphoma  . Thyroid cancer ([place] 2000    Past Surgical History:  Procedure Laterality Date  . ABDOMINAL HYSTERECTOMY  1983  . afib ablation     a. 2008 PVI b. 2014 convergent ablation  . APPENDECTOMY    . BONE MARROW BIOPSY  02/21/2017  . BREAST LUMPECTOMY Left 2010  . bso  1998  . CARDIAC CATHETERIZATION     2015- negative  . CARDIOVERSION  10/09/2012   Procedure: CARDIOVERSION;  Surgeon: JMinus Breeding  MD;  Location: [place];  Service: Cardiovascular;  Laterality: N/A;  . CARDIOVERSION  10/09/2012   Procedure: CARDIOVERSION;  Surgeon: Minus Breeding, MD;  Location: [facility] ENDOSCOPY;  Service: Cardiovascular;  Laterality: N/A;  [name] gave the ok to add pt to the add on , but we must check to find out if the can add pt on at [phone] ( [phone])  . CARDIOVERSION N/A 11/20/2012   Procedure: CARDIOVERSION;  Surgeon: Fay Records, MD;  Location: [facility] ENDOSCOPY;  Service: Cardiovascular;  Laterality: N/A;  . CARDIOVERSION N/A 07/18/2017   Procedure: CARDIOVERSION;  Surgeon: Thayer Headings, MD;  Location: [facility] ENDOSCOPY;  Service: Cardiovascular;  Laterality: N/A;  . CARDIOVERSION N/A 10/03/2017   Procedure: CARDIOVERSION;  Surgeon: Sanda Klein, MD;  Location: [facility] ENDOSCOPY;  Service: Cardiovascular;  Laterality: N/A;  . CARDIOVERSION N/A 01/07/2018   Procedure: CARDIOVERSION;  Surgeon: Acie Fredrickson Wonda Cheng, MD;   Location: [facility];  Service: Cardiovascular;  Laterality: N/A;  . CHOLECYSTECTOMY    . COLONOSCOPY W/ POLYPECTOMY     Dr Earlean Shawl  . CYSTOSCOPY N/A 02/06/2015   Procedure: CYSTOSCOPY;  Surgeon: Kathie Rhodes, MD;  Location: [facility];  Service: Urology;  Laterality: N/A;  . CYSTOSCOPY W/ RETROGRADES Left 11/17/2017   Procedure: CYSTOSCOPY WITH RETROGRADE /PYELOGRAM/;  Surgeon: Kathie Rhodes, MD;  Location: [facility];  Service: Urology;  Laterality: Left;  . CYSTOSCOPY WITH RETROGRADE PYELOGRAM, URETEROSCOPY AND STENT PLACEMENT Right 02/06/2015   Procedure: RETROGRADE PYELOGRAM, RIGHT URETEROSCOPY STENT PLACEMENT;  Surgeon: Kathie Rhodes, MD;  [facility]: [facility];  Service: Urology;  Laterality: Right;  . CYSTOSCOPY WITH RETROGRADE PYELOGRAM, URETEROSCOPY AND STENT PLACEMENT Right 03/07/2017   Procedure: CYSTOSCOPY WITH RIGHT RETROGRADE PYELOGRAM,RIGHT  URETEROSCOPYLASER LITHOTRIPSY  AND STENT PLACEMENT AND STONE BASKETRY;  Surgeon: Kathie Rhodes, MD;  Location: [place];  Service: Urology;  Laterality: Right;  . EYE SURGERY     cataract surgery  . fatty mass removal  1999   pubic area  . HOLMIUM LASER APPLICATION N/A 03/24/6377   Procedure: HOLMIUM LASER APPLICATION;  Surgeon: Kathie Rhodes, MD;  [facility];  Service: Urology;  Laterality: N/A;  . HOLMIUM LASER APPLICATION Right 5/88/5027   Procedure: HOLMIUM LASER APPLICATION;  Surgeon: Kathie Rhodes, MD;  Location: [facility];  Service: Urology;  Laterality: Right;  . HOLMIUM LASER APPLICATION Left 7/41/2878   Procedure: HOLMIUM LASER APPLICATION;  Surgeon: Kathie Rhodes, MD;  [facility];  Service: Urology;  Laterality: Left;  . IR FLUORO GUIDE PORT INSERTION RIGHT  02/24/2017  . IR NEPHROSTOMY PLACEMENT RIGHT  11/17/2017  . IR PATIENT EVAL TECH 0-60 MINS  03/11/2017  . IR REMOVAL TUN ACCESS W/ PORT W/O FL MOD SED  04/20/2018  . IR US GUIDE VASC ACCESS RIGHT  02/24/2017  . KNEE ARTHROSCOPY     bilateral  .  LAPAROSCOPIC GASTRIC BANDING  07/10/2010  . LAPAROSCOPIC GASTRIC BANDING     Laparoscopic adjustable banding APS System with posterior hiatal hernia, 2 suture.  Marland Kitchen LAPAROTOMY     for ruptured ovary and ovarian artery   . NEPHROLITHOTOMY Right 11/17/2017   Procedure: NEPHROLITHOTOMY PERCUTANEOUS;  Surgeon: Kathie Rhodes, MD;  [facility];  Service: Urology;  Laterality: Right;  . PACEMAKER INSERTION  03/10/2013   MDT dual chamber PPM  . POCKET REVISION N/A 12/08/2013   Procedure: POCKET REVISION;  Surgeon: Deboraha Sprang, MD;  [facility] C[facility] LAB;  Service: Cardiovascular;  Laterality: N/A;  . PORTA CATH INSERTION    .  REVERSE SHOULDER ARTHROPLASTY Right 05/14/2018   Procedure: RIGHT REVERSE SHOULDER ARTHROPLASTY;  Surgeon: Tania Ade, MD;  Location: [place];  Service: Orthopedics;  Laterality: Right;  . REVERSE SHOULDER REPLACEMENT Right 05/14/2018  . TEE WITH CARDIOVERSION  09/22/2017  . TEE WITHOUT CARDIOVERSION N/A 10/03/2017   Procedure: TRANSESOPHAGEAL ECHOCARDIOGRAM (TEE);  Surgeon: Sanda Klein, MD;  Location: [place];  Service: Cardiovascular;  Laterality: N/A;  . THYROIDECTOMY  1998   Dr Margot Chimes  . TONSILLECTOMY    . TOTAL KNEE ARTHROPLASTY  04/13/2012   Procedure: TOTAL KNEE ARTHROPLASTY;  Surgeon: Rudean Haskell, MD;  Location: [place];  Service: Orthopedics;  Laterality: Right;  Marland Kitchen VIDEO BRONCHOSCOPY WITH ENDOBRONCHIAL ULTRASOUND N/A 02/07/2017   Procedure: VIDEO BRONCHOSCOPY WITH ENDOBRONCHIAL ULTRASOUND;  Surgeon: Marshell Garfinkel, MD;  Location: [place];  Service: Pulmonary;  Laterality: N/A;    There were no vitals filed for this visit.  Subjective Assessment - 11/12/18 1322    Subjective  Went to podiatrist and got two injections due to pinched nerve in toe.      Pertinent History  lymphoma (had chemotherapy), vertigo, small vessel disease, arthritis (has had R knee and reverse total shoulder on R)    Limitations  Walking;House hold activities    Patient Stated  Goals  "I want to be able to walk in a normal fashion"     Currently in Pain?  No/denies                       [facility] Adult PT Treatment/Exercise - 11/12/18 1329      Transfers   Transfers  Sit to Stand;Stand to Sit    Comments  From 15" wooden step x 2 reps, however was slightly too difficulty without increased momentum, therefore changed to 15" step with airex performing sit<>stand with emphasis on slow controlled concentric and eccentric movements.  Pt with increased fatigue.        Self-Care   Self-Care  Other Self-Care Comments    Other Self-Care Comments   Discussed progress with therapy and showing pt that she can be challenged in less time as she now spends an hour and a half to 2 hours at the gym/pool.  She should be able to get adequate work out in 45 mins if pushing the right way with the right intensity/reps.  Pt verbalized understanding.       Therapeutic Activites    Therapeutic Activities  Other Therapeutic Activities    Other Therapeutic Activities  Floor to stand in treatment room to low mat x 1 reps at S level.  Min cues for safety of foot placement from tall kneel to half kneel.        Exercises   Other Exercises   Forward stepping aerobic step with opposite leg lift (into march position) x 15 reps on each side.  Progressed to lateral stepping over two aerobic steps with cues for wider steps to allow room on step/floor.  Performed x 15 reps.  Pt with marked fatigue following these tasks.  Performed steps x 1 rep with single rail for LE strengthening.  Due to increased fatigue, pt only able to perform single rep.  Standing on foam airex x 1 moving opposite LE into flex/ext x 10 reps on each side.              PT Education - 11/12/18 1437    Education Details  see self care     Person(s) Educated  Patient  Methods  Explanation    Comprehension  Verbalized understanding       PT Short Term Goals - 10/06/18 1147      PT SHORT TERM GOAL #1    Title  Pt will be independent with initial HEP in order to indicate decreased fall risk and improved functional mobility (Target Date: 09/20/18)    Baseline  met per verbal report    Time  5    Period  Weeks    Status  Achieved      PT SHORT TERM GOAL #2   Title  Pt will perform DGI and improve score 3 points from baseline in order to indicate decreased fall risk.      Baseline  15/24 on 12/10 to 22/24 on 12/31    Time  5    Period  Weeks    Status  Achieved      PT SHORT TERM GOAL #3   Title  Pt will improve gait speed to >/=2.62 ft/sec w/ LRAD in order to indicate safe community ambulator.     Baseline  3.50 ft/sec with cane, 3.64 ft/sec without cane on 09/22/18    Time  5    Period  Weeks    Status  Achieved      PT SHORT TERM GOAL #4   Title  Will assess need for AFO/support for LLE and request order as appropriate.      Baseline  deferred at this time, no need     Time  5    Period  Weeks    Status  Deferred      PT SHORT TERM GOAL #5   Title  Will assess 6MWT and improve distance by 150' in order to indicate improved functional endurance.     Baseline  859' on 12/10, 1010' on 12/31    Time  5    Period  Weeks    Status  Achieved      PT SHORT TERM GOAL #6   Title  Pt will perform floor recovery at mod A level in order to indiciate improved ability to get up in case of fall.      Baseline  min A on 09/22/18    Time  5    Period  Weeks    Status  Achieved      PT SHORT TERM GOAL #7   Title  Pt will negotiate up/down curb step with LRAD at mod I level in order to indicate safe community negotiation.     Baseline  met 10/06/18    Time  5    Period  Weeks    Status  Achieved        PT Long Term Goals - 11/10/18 1025      PT LONG TERM GOAL #1   Title  Pt will be independent with final HEP in order to indicate decreased fall risk and improved functional mobility.  (Target Date: 12/25/2018)    Baseline  On-going - pt has not yet participated in aquatic therapy      Status  On-going    Target Date  12/25/18      PT LONG TERM GOAL #2   Title  --    Baseline  --    Status  --      PT LONG TERM GOAL #3   Title  Pt will ambulate x 1000' w/o AD over unlevel outdoor paved surfaces at mod I level with decreased gait compensations in order to  indicate safe community mobility.      Baseline  met but with gait compensations (Trendelenburg)    Status  Revised      PT LONG TERM GOAL #4   Title  Pt will improve FGA to >/=25/30 in order to indicate decreased fall risk.     Baseline  21/30 on 11-03-18    Status  On-going      PT LONG TERM GOAL #5   Title  Pt will perform floor recovery at mod I level in order to indicate improved ability to get up from floor in case of fall.     Baseline  min assist on 11-03-18    Status  On-going      PT LONG TERM GOAL #6   Title  Pt will improve 6MWT by 300' from baseline in order to indicate improved functional endurance.     Baseline  1008' on 11/10/2018    Status  On-going            Plan - 11/12/18 1438    Clinical Impression Statement  Skilled session focused on floor recovery and LE strengthening along with endurance.  PT able to challenge pt today with continued standing exercises more so than she is at the gym.  PT would like for her to get a good challenging pool program and gym program and alternate days to have her work out for 45 mins at a time rather than 2 hours.      Rehab Potential  Good    Clinical Impairments Affecting Rehab Potential  neuropathy, uncertain of reason for adipose tissue in BLEs, chronic small vessel disease    PT Frequency  2x / week    PT Duration  6 weeks    PT Treatment/Interventions  ADLs/Self Care Home Management;Aquatic Therapy;DME Instruction;Gait training;Stair training;Functional mobility training;Therapeutic activities;Therapeutic exercise;Balance training;Neuromuscular re-education;Patient/family education;Orthotic Fit/Training;Scar mobilization;Passive range of motion;Dry  needling;Vestibular;Manual techniques    PT Next Visit Plan   continue floor recovery, balance, stepping strategy; continue with Lt hip strengthening, balance and gait training    PT Home Exercise Plan  [id number]    Consulted and Agree with Plan of Care  Patient       Patient will benefit from skilled therapeutic intervention in order to improve the following deficits and impairments:  Decreased activity tolerance, Decreased balance, Decreased coordination, Decreased endurance, Decreased knowledge of use of DME, Decreased mobility, Decreased range of motion, Decreased strength, Difficulty walking, Dizziness, Hypomobility, Impaired flexibility, Impaired perceived functional ability, Impaired sensation, Impaired UE functional use, Postural dysfunction  Visit Diagnosis: Unsteadiness on feet  Muscle weakness (generalized)  Other abnormalities of gait and mobility  Abnormal posture  Repeated falls     Problem List Patient Active Problem List   Diagnosis Date Noted  . Gait abnormality 07/10/2018  . S/P reverse total shoulder arthroplasty, 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  . Persistent atrial fibrillation   . Bilateral ureteral calculi 11/17/2017  . Atrial fibrillation with RVR ([place]) 09/15/2017  . Atrial flutter ([place]) 07/17/2017  . Port catheter in place 04/02/2017  . Non-Hodgkin lymphoma, unspecified, intrathoracic lymph nodes ([place]) 02/13/2017  . Mediastinal mass 02/06/2017  . 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 Abnormal chest x-ray   . Lung mass 02/03/2017  . 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  . OSA (obstructive sleep apnea) 02/03/2015  . History of renal calculi 11/16/2014  . Renal calculi 11/16/2014  . Family history of colon cancer 10/26/2014  . Mechanical complication due to cardiac pacemaker pulse generator 11/06/2013  .  Dyspnea 05/12/2013  . Hypothyroidism 04/21/2013  . Pleural effusion 04/19/2013  . Long term (current) use of anticoagulants 12/20/2010  .  EPIDERMOID CYST 08/22/2010  . Chronic diastolic heart failure ([place]) 08/06/2010  . SYNCOPE AND COLLAPSE 07/27/2010  . OSTEOARTHRITIS, KNEE, RIGHT 03/15/2010  . Morbid obesity ([place]) 12/07/2009  . NONSPEC ELEVATION OF LEVELS OF TRANSAMINASE/LDH 09/08/2009  . BREAST CANCER, HX OF 07/25/2009  . COLONIC POLYPS, HX OF 07/25/2009  . TUBULOVILLOUS ADENOMA, COLON 04/29/2008  . HYPERGLYCEMIA, FASTING 04/29/2008  . HYPERLIPIDEMIA 06/22/2007  . CARCINOMA, THYROID GLAND, HX OF 06/22/2007    Cameron Sprang, PT, MPT [facility] [address] [place], [place], [address] Phone: [phone]   Fax:  [phone] 11/12/18, 2:41 PM  Name: LESTINE RAHE MRN: [id number] Date of Birth: November 20, 1945

## 2018-11-17 ENCOUNTER — Ambulatory Visit: Payer: Medicare Other | Admitting: Rehabilitation

## 2018-11-17 ENCOUNTER — Encounter: Payer: Self-pay | Admitting: Rehabilitation

## 2018-11-17 DIAGNOSIS — M6281 Muscle weakness (generalized): Secondary | ICD-10-CM | POA: Diagnosis not present

## 2018-11-17 DIAGNOSIS — R2689 Other abnormalities of gait and mobility: Secondary | ICD-10-CM

## 2018-11-17 DIAGNOSIS — R296 Repeated falls: Secondary | ICD-10-CM | POA: Diagnosis not present

## 2018-11-17 DIAGNOSIS — R2681 Unsteadiness on feet: Secondary | ICD-10-CM | POA: Diagnosis not present

## 2018-11-17 DIAGNOSIS — R293 Abnormal posture: Secondary | ICD-10-CM

## 2018-11-17 DIAGNOSIS — R42 Dizziness and giddiness: Secondary | ICD-10-CM | POA: Diagnosis not present

## 2018-11-17 NOTE — Therapy (Signed)
[facility] [address] [place], [place], [address] Phone: [phone]   Fax:  [phone]  Physical Therapy Treatment  Patient Details  Name: BUFFEY ZABINSKI MRN: [id number] Date of Birth: 02-11-1946 Referring Provider (PT): Sarina Ill, MD   Encounter Date: 11/17/2018  PT End of Session - 11/17/18 0936    Visit Number  19    Number of Visits  29   per updated POC   Date for PT Re-Evaluation  60/63/01   cert written for 60 days, only plan to see for 45 days   Authorization Type  Medicare and [employer]-needs 10th visit progress note    PT Start Time  0931    PT Stop Time  1017    PT Time Calculation (min)  46 min    Activity Tolerance  Patient tolerated treatment well    Behavior During Therapy  [facility] for tasks assessed/performed       Past Medical History:  Diagnosis Date  . Anemia   . Arthritis    osteoarthritis - knees and right shoulder  . Atrial fibrillation [facility])    ablation- 2x's-- 1st time- [facility], 2nd event at [facility] in 2008. Convergent ablation at [facility] 6/14  . Atrial fibrillation ([place])   . Blood transfusion without reported diagnosis   . Breast cancer ([place])    Dr Margot Chimes, total thyroidectomy- 1999- for cancer  . 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 Chronic bilateral pleural effusions   . Colon polyp    Dr Earlean Shawl  . Complete heart block ([place])   . Complication of anesthesia    Ketamine produces LSD reaction, bright colored nightmarish experience   . Dyslipidemia   . Dyspnea   . Endometriosis   . Fibroids   . H/O pleural effusion    s/p thoracentesis w 3273m withdrawn  . Hepatitis    [place]hepatitis   . History of dysphagia    due to radiation therapy  . History of hiatal hernia    small noted on PET scan  . History of kidney stones   . Hx of thyroid cancer    Dr SForde Dandy . Hyperlipidemia   . Hypertension   . Hypothyroidism   . Lung cancer, lower lobe  ([place] 01[place]2017   radiation RX completed 03/04/17; will start chemo 6/27, pt unaware of lung cancer  . Morbid obesity ([place]    Status post lap band surgery  . Nephrolithiasis   . Non Hodgkin's lymphoma ([place]    on chemotherapy  . Persistent atrial fibrillation    a. s/p PVI 2008 b. s/p convergent ablation [id number] by bradycardia requiring pacemaker implant  . Presence of permanent cardiac pacemaker   . Rotator cuff tear    Right  . Sinus node dysfunction ([place]    Complicating convergent ablation 6/14  . St[place]    [place]  . SVC syndrome    with lung mass and non hodgkins lymphoma  . Thyroid cancer ([place] 2000    Past Surgical History:  Procedure Laterality Date  . ABDOMINAL HYSTERECTOMY  1983  . afib ablation     a. 2008 PVI b. 2014 convergent ablation  . APPENDECTOMY    . BONE MARROW BIOPSY  02/21/2017  . BREAST LUMPECTOMY Left 2010  . bso  1998  . CARDIAC CATHETERIZATION     2015- negative  . CARDIOVERSION  10/09/2012   Procedure: CARDIOVERSION;  Surgeon: JMinus Breeding  MD;  Location: [place];  Service: Cardiovascular;  Laterality: N/A;  . CARDIOVERSION  10/09/2012   Procedure: CARDIOVERSION;  Surgeon: Minus Breeding, MD;  Location: [facility] ENDOSCOPY;  Service: Cardiovascular;  Laterality: N/A;  [name] gave the ok to add pt to the add on , but we must check to find out if the can add pt on at [phone] ( [phone])  . CARDIOVERSION N/A 11/20/2012   Procedure: CARDIOVERSION;  Surgeon: Fay Records, MD;  Location: [facility] ENDOSCOPY;  Service: Cardiovascular;  Laterality: N/A;  . CARDIOVERSION N/A 07/18/2017   Procedure: CARDIOVERSION;  Surgeon: Thayer Headings, MD;  Location: [facility] ENDOSCOPY;  Service: Cardiovascular;  Laterality: N/A;  . CARDIOVERSION N/A 10/03/2017   Procedure: CARDIOVERSION;  Surgeon: Sanda Klein, MD;  Location: [facility] ENDOSCOPY;  Service: Cardiovascular;  Laterality: N/A;  . CARDIOVERSION N/A 01/07/2018   Procedure: CARDIOVERSION;  Surgeon: Acie Fredrickson Wonda Cheng, MD;   Location: [facility];  Service: Cardiovascular;  Laterality: N/A;  . CHOLECYSTECTOMY    . COLONOSCOPY W/ POLYPECTOMY     Dr Earlean Shawl  . CYSTOSCOPY N/A 02/06/2015   Procedure: CYSTOSCOPY;  Surgeon: Kathie Rhodes, MD;  Location: [facility];  Service: Urology;  Laterality: N/A;  . CYSTOSCOPY W/ RETROGRADES Left 11/17/2017   Procedure: CYSTOSCOPY WITH RETROGRADE /PYELOGRAM/;  Surgeon: Kathie Rhodes, MD;  Location: [facility];  Service: Urology;  Laterality: Left;  . CYSTOSCOPY WITH RETROGRADE PYELOGRAM, URETEROSCOPY AND STENT PLACEMENT Right 02/06/2015   Procedure: RETROGRADE PYELOGRAM, RIGHT URETEROSCOPY STENT PLACEMENT;  Surgeon: Kathie Rhodes, MD;  [facility]: [facility];  Service: Urology;  Laterality: Right;  . CYSTOSCOPY WITH RETROGRADE PYELOGRAM, URETEROSCOPY AND STENT PLACEMENT Right 03/07/2017   Procedure: CYSTOSCOPY WITH RIGHT RETROGRADE PYELOGRAM,RIGHT  URETEROSCOPYLASER LITHOTRIPSY  AND STENT PLACEMENT AND STONE BASKETRY;  Surgeon: Kathie Rhodes, MD;  Location: [place];  Service: Urology;  Laterality: Right;  . EYE SURGERY     cataract surgery  . fatty mass removal  1999   pubic area  . HOLMIUM LASER APPLICATION N/A 03/24/6377   Procedure: HOLMIUM LASER APPLICATION;  Surgeon: Kathie Rhodes, MD;  [facility];  Service: Urology;  Laterality: N/A;  . HOLMIUM LASER APPLICATION Right 5/88/5027   Procedure: HOLMIUM LASER APPLICATION;  Surgeon: Kathie Rhodes, MD;  Location: [facility];  Service: Urology;  Laterality: Right;  . HOLMIUM LASER APPLICATION Left 7/41/2878   Procedure: HOLMIUM LASER APPLICATION;  Surgeon: Kathie Rhodes, MD;  [facility];  Service: Urology;  Laterality: Left;  . IR FLUORO GUIDE PORT INSERTION RIGHT  02/24/2017  . IR NEPHROSTOMY PLACEMENT RIGHT  11/17/2017  . IR PATIENT EVAL TECH 0-60 MINS  03/11/2017  . IR REMOVAL TUN ACCESS W/ PORT W/O FL MOD SED  04/20/2018  . IR US GUIDE VASC ACCESS RIGHT  02/24/2017  . KNEE ARTHROSCOPY     bilateral  .  LAPAROSCOPIC GASTRIC BANDING  07/10/2010  . LAPAROSCOPIC GASTRIC BANDING     Laparoscopic adjustable banding APS System with posterior hiatal hernia, 2 suture.  Marland Kitchen LAPAROTOMY     for ruptured ovary and ovarian artery   . NEPHROLITHOTOMY Right 11/17/2017   Procedure: NEPHROLITHOTOMY PERCUTANEOUS;  Surgeon: Kathie Rhodes, MD;  [facility];  Service: Urology;  Laterality: Right;  . PACEMAKER INSERTION  03/10/2013   MDT dual chamber PPM  . POCKET REVISION N/A 12/08/2013   Procedure: POCKET REVISION;  Surgeon: Deboraha Sprang, MD;  Location[facility] CATH LAB;  Service: Cardiovascular;  Laterality: N/A;  . PORTA CATH INSERTION    .  REVERSE SHOULDER ARTHROPLASTY Right 05/14/2018   Procedure: RIGHT REVERSE SHOULDER ARTHROPLASTY;  Surgeon: Tania Ade, MD;  Location: [place];  Service: Orthopedics;  Laterality: Right;  . REVERSE SHOULDER REPLACEMENT Right 05/14/2018  . TEE WITH CARDIOVERSION  09/22/2017  . TEE WITHOUT CARDIOVERSION N/A 10/03/2017   Procedure: TRANSESOPHAGEAL ECHOCARDIOGRAM (TEE);  Surgeon: Sanda Klein, MD;  Location: [place];  Service: Cardiovascular;  Laterality: N/A;  . THYROIDECTOMY  1998   Dr Margot Chimes  . TONSILLECTOMY    . TOTAL KNEE ARTHROPLASTY  04/13/2012   Procedure: TOTAL KNEE ARTHROPLASTY;  Surgeon: Rudean Haskell, MD;  Location: [place];  Service: Orthopedics;  Laterality: Right;  Marland Kitchen VIDEO BRONCHOSCOPY WITH ENDOBRONCHIAL ULTRASOUND N/A 02/07/2017   Procedure: VIDEO BRONCHOSCOPY WITH ENDOBRONCHIAL ULTRASOUND;  Surgeon: Marshell Garfinkel, MD;  Location: [facility];  Service: Pulmonary;  Laterality: N/A;    There were no vitals filed for this visit.  Subjective Assessment - 11/17/18 0934    Subjective  Pt reports that there is some pain in R foot.     Pertinent History  lymphoma (had chemotherapy), vertigo, small vessel disease, arthritis (has had R knee and reverse total shoulder on R)    Limitations  Walking;House hold activities    Patient Stated Goals  "I want to be able  to walk in a normal fashion"     Currently in Pain?  Yes    Pain Score  3     Pain Location  Foot    Pain Orientation  Right    Pain Descriptors / Indicators  Aching    Pain Type  Acute pain    Pain Onset  1 to 4 weeks ago    Pain Frequency  Intermittent    Aggravating Factors   walking    Pain Relieving Factors  not walking                       [facility] Adult PT Treatment/Exercise - 11/17/18 0935      Self-Care   Self-Care  Other Self-Care Comments    Other Self-Care Comments   Discussed having pt try to do STM/release with use of frozen water bottle along TLF and ITB.  Educated for pt to do after walking program to reduce tightness/pain.  Also recommended she use rollator during walking in the neighborhood in order to reduce gait compensations likely leading to hip bursitis and tightness.  Pt verbalized understanding.       Therapeutic Activites    Other Therapeutic Activities  Performed floor to stand today at S level from low mat.  Min cues to remember to move yoga mat once stable on feet prior to stepping L foot closer to mat to avoid getting foot caught.  Will continue to practice from higher surfaces.       Neuro Re-ed    Neuro Re-ed Details   High level balance in // bars to address multi sensory balance deficits along with balance strategies:  Standing on BOSU (black top up) with feet apart EO x 3 sets of 20 secs with light intermittent single UE support.  Progressed to mini squats x 8 reps on BOSU with cues for forward reach to counter balance posterior weight shift.  Moved to balance on foam beam with tandem walking along beam with lateral cone tap alternating LEs x 4 laps.  Pt became very light headed during task and therefore allowed pt to sit.  BP was 117/72.  Pt felt better following several minutes  and returned to complete task x 2 more laps.  Cues for continued breathing throughout as well as cues for improved forward weight shift onto stance leg.         Exercises   Exercises  Other Exercises    Other Exercises   Standing hip abd/ext (45 deg angle) with blue theraband with cues for slower, more controlled movement and improved stability on stance leg with decreased UE support.  Performed x 10 rpes on each side.  Pt reporting pain at lateral anterior portion of L hip.  Upon further assessment, seems to have ITB/TFL tightness along with possible bursitis.  Performed ITB stretch with strap during session with cues for knee extension and more prolonged stretch.  Also performed ITB/TFL release with foam roll during session.               PT Education - 11/17/18 1126    Education Details  see self care    Person(s) Educated  Patient    Methods  Explanation    Comprehension  Verbalized understanding       PT Short Term Goals - 10/06/18 1147      PT SHORT TERM GOAL #1   Title  Pt will be independent with initial HEP in order to indicate decreased fall risk and improved functional mobility (Target Date: 09/20/18)    Baseline  met per verbal report    Time  5    Period  Weeks    Status  Achieved      PT SHORT TERM GOAL #2   Title  Pt will perform DGI and improve score 3 points from baseline in order to indicate decreased fall risk.      Baseline  15/24 on 12/10 to 22/24 on 12/31    Time  5    Period  Weeks    Status  Achieved      PT SHORT TERM GOAL #3   Title  Pt will improve gait speed to >/=2.62 ft/sec w/ LRAD in order to indicate safe community ambulator.     Baseline  3.50 ft/sec with cane, 3.64 ft/sec without cane on 09/22/18    Time  5    Period  Weeks    Status  Achieved      PT SHORT TERM GOAL #4   Title  Will assess need for AFO/support for LLE and request order as appropriate.      Baseline  deferred at this time, no need     Time  5    Period  Weeks    Status  Deferred      PT SHORT TERM GOAL #5   Title  Will assess 6MWT and improve distance by 150' in order to indicate improved functional endurance.     Baseline   859' on 12/10, 1010' on 12/31    Time  5    Period  Weeks    Status  Achieved      PT SHORT TERM GOAL #6   Title  Pt will perform floor recovery at mod A level in order to indiciate improved ability to get up in case of fall.      Baseline  min A on 09/22/18    Time  5    Period  Weeks    Status  Achieved      PT SHORT TERM GOAL #7   Title  Pt will negotiate up/down curb step with LRAD at mod I level in order to indicate safe  community negotiation.     Baseline  met 10/06/18    Time  5    Period  Weeks    Status  Achieved        PT Long Term Goals - 11/10/18 1025      PT LONG TERM GOAL #1   Title  Pt will be independent with final HEP in order to indicate decreased fall risk and improved functional mobility.  (Target Date: 12/25/2018)    Baseline  On-going - pt has not yet participated in aquatic therapy     Status  On-going    Target Date  12/25/18      PT LONG TERM GOAL #2   Title  --    Baseline  --    Status  --      PT LONG TERM GOAL #3   Title  Pt will ambulate x 1000' w/o AD over unlevel outdoor paved surfaces at mod I level with decreased gait compensations in order to indicate safe community mobility.      Baseline  met but with gait compensations (Trendelenburg)    Status  Revised      PT LONG TERM GOAL #4   Title  Pt will improve FGA to >/=25/30 in order to indicate decreased fall risk.     Baseline  21/30 on 11-03-18    Status  On-going      PT LONG TERM GOAL #5   Title  Pt will perform floor recovery at mod I level in order to indicate improved ability to get up from floor in case of fall.     Baseline  min assist on 11-03-18    Status  On-going      PT LONG TERM GOAL #6   Title  Pt will improve 6MWT by 300' from baseline in order to indicate improved functional endurance.     Baseline  1008' on 11/10/2018    Status  On-going            Plan - 11/17/18 1128    Clinical Impression Statement  Pt had instance of light headedness during balance tasks  today which resolved with seated rest.  Feel that pt likely holding breath during tasks.  Also note marked tightness/pain in L TFL/ITB during session.  Educated on rolling with frozen water bottle and using rollator for longer distances to decrease inflammation and gait compensations.  Pt verbalized understanding.     Rehab Potential  Good    Clinical Impairments Affecting Rehab Potential  neuropathy, uncertain of reason for adipose tissue in BLEs, chronic small vessel disease    PT Frequency  2x / week    PT Duration  6 weeks    PT Treatment/Interventions  ADLs/Self Care Home Management;Aquatic Therapy;DME Instruction;Gait training;Stair training;Functional mobility training;Therapeutic activities;Therapeutic exercise;Balance training;Neuromuscular re-education;Patient/family education;Orthotic Fit/Training;Scar mobilization;Passive range of motion;Dry needling;Vestibular;Manual techniques    PT Next Visit Plan  See if she was able to do water bottle rolling on L hip/rollator for distance walking to reduce compensations, continue floor recovery, balance, stepping strategy; continue with Lt hip strengthening, balance and gait training    PT Home Exercise Plan  [id number]    Consulted and Agree with Plan of Care  Patient       Patient will benefit from skilled therapeutic intervention in order to improve the following deficits and impairments:  Decreased activity tolerance, Decreased balance, Decreased coordination, Decreased endurance, Decreased knowledge of use of DME, Decreased mobility, Decreased range of motion, Decreased strength, Difficulty  walking, Dizziness, Hypomobility, Impaired flexibility, Impaired perceived functional ability, Impaired sensation, Impaired UE functional use, Postural dysfunction  Visit Diagnosis: Unsteadiness on feet  Muscle weakness (generalized)  Other abnormalities of gait and mobility  Abnormal posture     Problem List Patient Active Problem List   Diagnosis  Date Noted  . Gait abnormality 07/10/2018  . S/P reverse total shoulder arthroplasty, 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  . Persistent atrial fibrillation   . Bilateral ureteral calculi 11/17/2017  . Atrial fibrillation with RVR ([place]) 09/15/2017  . Atrial flutter ([place]) 07/17/2017  . Port catheter in place 04/02/2017  . Non-Hodgkin lymphoma, unspecified, intrathoracic lymph nodes ([place]) 02/13/2017  . Mediastinal mass 02/06/2017  . 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 Abnormal chest x-ray   . Lung mass 02/03/2017  . 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  . OSA (obstructive sleep apnea) 02/03/2015  . History of renal calculi 11/16/2014  . Renal calculi 11/16/2014  . Family history of colon cancer 10/26/2014  . Mechanical complication due to cardiac pacemaker pulse generator 11/06/2013  . Dyspnea 05/12/2013  . Hypothyroidism 04/21/2013  . Pleural effusion 04/19/2013  . Long term (current) use of anticoagulants 12/20/2010  . EPIDERMOID CYST 08/22/2010  . Chronic diastolic heart failure ([place]) 08/06/2010  . SYNCOPE AND COLLAPSE 07/27/2010  . OSTEOARTHRITIS, KNEE, RIGHT 03/15/2010  . Morbid obesity ([place]) 12/07/2009  . NONSPEC ELEVATION OF LEVELS OF TRANSAMINASE/LDH 09/08/2009  . BREAST CANCER, HX OF 07/25/2009  . COLONIC POLYPS, HX OF 07/25/2009  . TUBULOVILLOUS ADENOMA, COLON 04/29/2008  . HYPERGLYCEMIA, FASTING 04/29/2008  . HYPERLIPIDEMIA 06/22/2007  . CARCINOMA, THYROID GLAND, HX OF 06/22/2007    Cameron Sprang, PT, MPT [facility] [address] [place], [place], [address] Phone: [phone]   Fax:  [phone] 11/17/18, 11:31 AM  Name: ALYVIA DERK MRN: [id number] Date of Birth: May 06, 1946

## 2018-11-18 DIAGNOSIS — G5761 Lesion of plantar nerve, right lower limb: Secondary | ICD-10-CM | POA: Diagnosis not present

## 2018-11-19 ENCOUNTER — Ambulatory Visit: Payer: Medicare Other | Admitting: Physical Therapy

## 2018-11-19 VITALS — BP 110/55 | HR 84

## 2018-11-19 DIAGNOSIS — M6281 Muscle weakness (generalized): Secondary | ICD-10-CM | POA: Diagnosis not present

## 2018-11-19 DIAGNOSIS — R296 Repeated falls: Secondary | ICD-10-CM | POA: Diagnosis not present

## 2018-11-19 DIAGNOSIS — R2681 Unsteadiness on feet: Secondary | ICD-10-CM | POA: Diagnosis not present

## 2018-11-19 DIAGNOSIS — R2689 Other abnormalities of gait and mobility: Secondary | ICD-10-CM | POA: Diagnosis not present

## 2018-11-19 DIAGNOSIS — R293 Abnormal posture: Secondary | ICD-10-CM | POA: Diagnosis not present

## 2018-11-19 DIAGNOSIS — R42 Dizziness and giddiness: Secondary | ICD-10-CM | POA: Diagnosis not present

## 2018-11-20 NOTE — Therapy (Signed)
[facility] [address] [place], [place], [address] Phone: [phone]   Fax:  [phone]  Physical Therapy Treatment and 10th Visit Progress Note  Patient Details  Name: Kayla Harrison MRN: [id number] Date of Birth: 11/07/1945 Referring Provider (PT): Sarina Ill, MD   Encounter Date: 11/19/2018  PT End of Session - 11/20/18 0927    Visit Number  20    Number of Visits  29    Date for PT Re-Evaluation  01/09/19    Authorization Type  Medicare and [employer]-needs 10th visit progress note    PT Start Time  0801    PT Stop Time  0845    PT Time Calculation (min)  44 min       Past Medical History:  Diagnosis Date  . Anemia   . Arthritis    osteoarthritis - knees and right shoulder  . Atrial fibrillation [facility])    ablation- 2x's-- 1st time- [facility], 2nd event at [facility] in 2008. Convergent ablation at [facility] 6/14  . Atrial fibrillation ([place])   . Blood transfusion without reported diagnosis   . Breast cancer ([place])    Dr Margot Chimes, total thyroidectomy- 1999- for cancer  . 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 Chronic bilateral pleural effusions   . Colon polyp    Dr Earlean Shawl  . Complete heart block ([place])   . Complication of anesthesia    Ketamine produces LSD reaction, bright colored nightmarish experience   . Dyslipidemia   . Dyspnea   . Endometriosis   . Fibroids   . H/O pleural effusion    s/p thoracentesis w 3280m withdrawn  . Hepatitis    [place]hepatitis   . History of dysphagia    due to radiation therapy  . History of hiatal hernia    small noted on PET scan  . History of kidney stones   . Hx of thyroid cancer    Dr SForde Dandy . Hyperlipidemia   . Hypertension   . Hypothyroidism   . Lung cancer, [place] 01[place]   radiation RX completed 03/04/17; will start chemo 6/27, pt unaware of lung cancer  . Morbid obesity ([place]    Status post lap band surgery  .  Nephrolithiasis   . Non Hodgkin's lymphoma ([place]    on chemotherapy  . Persistent atrial fibrillation    a. s/p PVI 2008 b. s/p convergent ablation [id number] by bradycardia requiring pacemaker implant  . Presence of permanent cardiac pacemaker   . Rotator cuff tear    Right  . Sinus node dysfunction ([place]    Complicating convergent ablation 6/14  . [place]    [place]  . SVC syndrome    with lung mass and non hodgkins lymphoma  . Thyroid cancer ([facility] 2000    Past Surgical History:  Procedure Laterality Date  . ABDOMINAL HYSTERECTOMY  1983  . afib ablation     a. 2008 PVI b. 2014 convergent ablation  . APPENDECTOMY    . BONE MARROW BIOPSY  02/21/2017  . BREAST LUMPECTOMY Left 2010  . bso  1998  . CARDIAC CATHETERIZATION     2015- negative  . CARDIOVERSION  10/09/2012   Procedure: CARDIOVERSION;  Surgeon: JMinus Breeding MD;  [place]  Service: Cardiovascular;  Laterality: N/A;  . CARDIOVERSION  10/09/2012   Procedure: CARDIOVERSION;  Surgeon: JMinus Breeding MD;  [place]  Service: Cardiovascular;  Laterality: N/A;  [name] gave the ok to add pt to the add on , but we must check to find out if the can add pt on at [phone] ( [phone])  . CARDIOVERSION N/A 11/20/2012   Procedure: CARDIOVERSION;  Surgeon: Fay Records, MD;  Location: [facility] ENDOSCOPY;  Service: Cardiovascular;  Laterality: N/A;  . CARDIOVERSION N/A 07/18/2017   Procedure: CARDIOVERSION;  Surgeon: Thayer Headings, MD;  Location: [facility] ENDOSCOPY;  Service: Cardiovascular;  Laterality: N/A;  . CARDIOVERSION N/A 10/03/2017   Procedure: CARDIOVERSION;  Surgeon: Sanda Klein, MD;  Location: [facility] ENDOSCOPY;  Service: Cardiovascular;  Laterality: N/A;  . CARDIOVERSION N/A 01/07/2018   Procedure: CARDIOVERSION;  Surgeon: Acie Fredrickson Wonda Cheng, MD;  Location: [place];  Service: Cardiovascular;  Laterality: N/A;  . CHOLECYSTECTOMY    . COLONOSCOPY W/ POLYPECTOMY     Dr Earlean Shawl  . CYSTOSCOPY N/A  02/06/2015   Procedure: CYSTOSCOPY;  Surgeon: Kathie Rhodes, MD;  [facility];  Service: Urology;  Laterality: N/A;  . CYSTOSCOPY W/ RETROGRADES Left 11/17/2017   Procedure: CYSTOSCOPY WITH RETROGRADE /PYELOGRAM/;  Surgeon: Kathie Rhodes, MD;  [facility]: [facility];  Service: Urology;  Laterality: Left;  . CYSTOSCOPY WITH RETROGRADE PYELOGRAM, URETEROSCOPY AND STENT PLACEMENT Right 02/06/2015   Procedure: RETROGRADE PYELOGRAM, RIGHT URETEROSCOPY STENT PLACEMENT;  Surgeon: Kathie Rhodes, MD;  [facility];  Service: Urology;  Laterality: Right;  . CYSTOSCOPY WITH RETROGRADE PYELOGRAM, URETEROSCOPY AND STENT PLACEMENT Right 03/07/2017   Procedure: CYSTOSCOPY WITH RIGHT RETROGRADE PYELOGRAM,RIGHT  URETEROSCOPYLASER LITHOTRIPSY  AND STENT PLACEMENT AND STONE BASKETRY;  Surgeon: Kathie Rhodes, MD;  Location: [place];  Service: Urology;  Laterality: Right;  . EYE SURGERY     cataract surgery  . fatty mass removal  1999   pubic area  . HOLMIUM LASER APPLICATION N/A 7/89/3810   Procedure: HOLMIUM LASER APPLICATION;  Surgeon: Kathie Rhodes, MD;  [facility];  Service: Urology;  Laterality: N/A;  . HOLMIUM LASER APPLICATION Right 1/75/1025   Procedure: HOLMIUM LASER APPLICATION;  Surgeon: Kathie Rhodes, MD;  Location: [facility];  Service: Urology;  Laterality: Right;  . HOLMIUM LASER APPLICATION Left 8/52/7782   Procedure: HOLMIUM LASER APPLICATION;  Surgeon: Kathie Rhodes, MD;  [facility];  Service: Urology;  Laterality: Left;  . IR FLUORO GUIDE PORT INSERTION RIGHT  02/24/2017  . IR NEPHROSTOMY PLACEMENT RIGHT  11/17/2017  . IR PATIENT EVAL TECH 0-60 MINS  03/11/2017  . IR REMOVAL TUN ACCESS W/ PORT W/O FL MOD SED  04/20/2018  . IR US GUIDE VASC ACCESS RIGHT  02/24/2017  . KNEE ARTHROSCOPY     bilateral  . LAPAROSCOPIC GASTRIC BANDING  07/10/2010  . LAPAROSCOPIC GASTRIC BANDING     Laparoscopic adjustable banding APS System with posterior hiatal hernia, 2  suture.  Marland Kitchen LAPAROTOMY     for ruptured ovary and ovarian artery   . NEPHROLITHOTOMY Right 11/17/2017   Procedure: NEPHROLITHOTOMY PERCUTANEOUS;  Surgeon: Kathie Rhodes, MD;  [facility];  Service: Urology;  Laterality: Right;  . PACEMAKER INSERTION  03/10/2013   MDT dual chamber PPM  . POCKET REVISION N/A 12/08/2013   Procedure: POCKET REVISION;  Surgeon: Deboraha Sprang, MD;  Location: [facility] CATH LAB;  Service: Cardiovascular;  Laterality: N/A;  . PORTA CATH INSERTION    . REVERSE SHOULDER ARTHROPLASTY Right 05/14/2018   Procedure: RIGHT REVERSE SHOULDER ARTHROPLASTY;  Surgeon: Tania Ade, MD;  Location: [place];  Service: Orthopedics;  Laterality: Right;  . REVERSE SHOULDER REPLACEMENT  Right 05/14/2018  . TEE WITH CARDIOVERSION  09/22/2017  . TEE WITHOUT CARDIOVERSION N/A 10/03/2017   Procedure: TRANSESOPHAGEAL ECHOCARDIOGRAM (TEE);  Surgeon: Sanda Klein, MD;  Location: [place];  Service: Cardiovascular;  Laterality: N/A;  . THYROIDECTOMY  1998   Dr Margot Chimes  . TONSILLECTOMY    . TOTAL KNEE ARTHROPLASTY  04/13/2012   Procedure: TOTAL KNEE ARTHROPLASTY;  Surgeon: Rudean Haskell, MD;  Location: [place];  Service: Orthopedics;  Laterality: Right;  Marland Kitchen VIDEO BRONCHOSCOPY WITH ENDOBRONCHIAL ULTRASOUND N/A 02/07/2017   Procedure: VIDEO BRONCHOSCOPY WITH ENDOBRONCHIAL ULTRASOUND;  Surgeon: Marshell Garfinkel, MD;  Location: [place];  Service: Pulmonary;  Laterality: N/A;    Vitals:   11/19/18 0815  BP: (!) 110/55  Pulse: 84  SpO2: 95%    Subjective Assessment - 11/20/18 0925    Subjective  Pt states she is not doing well today - went to [facility] yesterday and did swimming; came home and went into atrial fibrillation - says she was breathing heavy but "cardioverted"; had feeling that knees were still wobbly and still on "that blue ball"; got injection in right foot yesterday     Pertinent History  lymphoma (had chemotherapy), vertigo, small vessel disease, arthritis (has had R knee and reverse  total shoulder on R)    Patient Stated Goals  "I want to be able to walk in a normal fashion"           Manual therapy; rolling with white foam bolster over Lt IT band area with deep palpation  Stretching of Lt IT band attempted but pt stated she did not have sufficient balance to perform stretch in standing today - pt reported not feeling well Since PT session on Tues., 11-17-18 - states she has felt wobbly and unstable since standing on Bosu activity (due to vestibular hypofunction??)   Self Care; BP recorded - 110/55 - instructed pt to follow up with MD regarding low BP and also due to symptoms consistent with Lt trochanteric bursitis  recommended pt to use frozen bag of peas over Lt hip region to allow the cold to easily conform to the area                    PT Short Term Goals - 10/06/18 1147      PT SHORT TERM GOAL #1   Title  Pt will be independent with initial HEP in order to indicate decreased fall risk and improved functional mobility (Target Date: 09/20/18)    Baseline  met per verbal report    Time  5    Period  Weeks    Status  Achieved      PT SHORT TERM GOAL #2   Title  Pt will perform DGI and improve score 3 points from baseline in order to indicate decreased fall risk.      Baseline  15/24 on 12/10 to 22/24 on 12/31    Time  5    Period  Weeks    Status  Achieved      PT SHORT TERM GOAL #3   Title  Pt will improve gait speed to >/=2.62 ft/sec w/ LRAD in order to indicate safe community ambulator.     Baseline  3.50 ft/sec with cane, 3.64 ft/sec without cane on 09/22/18    Time  5    Period  Weeks    Status  Achieved      PT SHORT TERM GOAL #4   Title  Will assess need for  AFO/support for LLE and request order as appropriate.      Baseline  deferred at this time, no need     Time  5    Period  Weeks    Status  Deferred      PT SHORT TERM GOAL #5   Title  Will assess 6MWT and improve distance by 150' in order to indicate improved  functional endurance.     Baseline  859' on 12/10, 1010' on 12/31    Time  5    Period  Weeks    Status  Achieved      PT SHORT TERM GOAL #6   Title  Pt will perform floor recovery at mod A level in order to indiciate improved ability to get up in case of fall.      Baseline  min A on 09/22/18    Time  5    Period  Weeks    Status  Achieved      PT SHORT TERM GOAL #7   Title  Pt will negotiate up/down curb step with LRAD at mod I level in order to indicate safe community negotiation.     Baseline  met 10/06/18    Time  5    Period  Weeks    Status  Achieved        PT Long Term Goals - 11/10/18 1025      PT LONG TERM GOAL #1   Title  Pt will be independent with final HEP in order to indicate decreased fall risk and improved functional mobility.  (Target Date: 12/25/2018)    Baseline  On-going - pt has not yet participated in aquatic therapy     Status  On-going    Target Date  12/25/18      PT LONG TERM GOAL #2   Title  --    Baseline  --    Status  --      PT LONG TERM GOAL #3   Title  Pt will ambulate x 1000' w/o AD over unlevel outdoor paved surfaces at mod I level with decreased gait compensations in order to indicate safe community mobility.      Baseline  met but with gait compensations (Trendelenburg)    Status  Revised      PT LONG TERM GOAL #4   Title  Pt will improve FGA to >/=25/30 in order to indicate decreased fall risk.     Baseline  21/30 on 11-03-18    Status  On-going      PT LONG TERM GOAL #5   Title  Pt will perform floor recovery at mod I level in order to indicate improved ability to get up from floor in case of fall.     Baseline  min assist on 11-03-18    Status  On-going      PT LONG TERM GOAL #6   Title  Pt will improve 6MWT by 300' from baseline in order to indicate improved functional endurance.     Baseline  1008' on 11/10/2018    Status  On-going            Plan - 11/20/18 0950    Clinical Impression Statement  This 10th visit  progress note covers dates 10-01-18 -11-19-18:  pt had been progressing well until today's session, in which she reported her knees had felt wobbly & unstable since performing Bosu activities in PT session on 11-17-18:  Pt also reports she experienced atrial fibrillation for  1st time in over a yr on Tues., 11-17-18 in late pm after going to [facility] & doing water ex. in pool.  Pt's BP is low today (110/55) with pt reporting fatigue & feeling "wiped out."  session focused on stretching, manula therpay & rolling of Lt IT band with discussion with pt that she may also have Lt trochanteric bursitis - recommended to pt that she follow up with MD.      Rehab Potential  Good    Clinical Impairments Affecting Rehab Potential  neuropathy, uncertain of reason for adipose tissue in BLEs, chronic small vessel disease    PT Frequency  2x / week    PT Duration  6 weeks    PT Treatment/Interventions  ADLs/Self Care Home Management;Aquatic Therapy;DME Instruction;Gait training;Stair training;Functional mobility training;Therapeutic activities;Therapeutic exercise;Balance training;Neuromuscular re-education;Patient/family education;Orthotic Fit/Training;Scar mobilization;Passive range of motion;Dry needling;Vestibular;Manual techniques    PT Next Visit Plan  See if she was able to do water bottle rolling on L hip/rollator for distance walking to reduce compensations, continue floor recovery, balance, stepping strategy; continue with Lt hip strengthening, balance and gait training    PT Home Exercise Plan  [id number]    Consulted and Agree with Plan of Care  Patient       Patient will benefit from skilled therapeutic intervention in order to improve the following deficits and impairments:  Decreased activity tolerance, Decreased balance, Decreased coordination, Decreased endurance, Decreased knowledge of use of DME, Decreased mobility, Decreased range of motion, Decreased strength, Difficulty walking, Dizziness, Hypomobility,  Impaired flexibility, Impaired perceived functional ability, Impaired sensation, Impaired UE functional use, Postural dysfunction  Visit Diagnosis: Other abnormalities of gait and mobility     Problem List Patient Active Problem List   Diagnosis Date Noted  . Gait abnormality 07/10/2018  . S/P reverse total shoulder arthroplasty, 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  . Persistent atrial fibrillation   . Bilateral ureteral calculi 11/17/2017  . Atrial fibrillation with RVR ([place]) 09/15/2017  . Atrial flutter ([place]) 07/17/2017  . Port catheter in place 04/02/2017  . Non-Hodgkin lymphoma, unspecified, intrathoracic lymph nodes ([place]) 02/13/2017  . Mediastinal mass 02/06/2017  . 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 Abnormal chest x-ray   . Lung mass 02/03/2017  . 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  . OSA (obstructive sleep apnea) 02/03/2015  . History of renal calculi 11/16/2014  . Renal calculi 11/16/2014  . Family history of colon cancer 10/26/2014  . Mechanical complication due to cardiac pacemaker pulse generator 11/06/2013  . Dyspnea 05/12/2013  . Hypothyroidism 04/21/2013  . Pleural effusion 04/19/2013  . Long term (current) use of anticoagulants 12/20/2010  . EPIDERMOID CYST 08/22/2010  . Chronic diastolic heart failure ([place]) 08/06/2010  . SYNCOPE AND COLLAPSE 07/27/2010  . OSTEOARTHRITIS, KNEE, RIGHT 03/15/2010  . Morbid obesity ([place]) 12/07/2009  . NONSPEC ELEVATION OF LEVELS OF TRANSAMINASE/LDH 09/08/2009  . BREAST CANCER, HX OF 07/25/2009  . COLONIC POLYPS, HX OF 07/25/2009  . TUBULOVILLOUS ADENOMA, COLON 04/29/2008  . HYPERGLYCEMIA, FASTING 04/29/2008  . HYPERLIPIDEMIA 06/22/2007  . CARCINOMA, THYROID GLAND, HX OF 06/22/2007    Alda Lea, PT 11/20/2018, 9:54 AM  [place] [address] [place], [place], [address] Phone: [phone]   Fax:  [phone]  Name: ZAYLIE GISLER MRN:  [id number] Date of Birth: Sep 09, 1946

## 2018-11-23 ENCOUNTER — Other Ambulatory Visit: Payer: Self-pay | Admitting: Internal Medicine

## 2018-11-23 ENCOUNTER — Ambulatory Visit: Payer: Medicare Other | Attending: Neurology | Admitting: Physical Therapy

## 2018-11-23 DIAGNOSIS — R2681 Unsteadiness on feet: Secondary | ICD-10-CM

## 2018-11-23 DIAGNOSIS — M6281 Muscle weakness (generalized): Secondary | ICD-10-CM

## 2018-11-23 DIAGNOSIS — R293 Abnormal posture: Secondary | ICD-10-CM | POA: Insufficient documentation

## 2018-11-23 DIAGNOSIS — R2689 Other abnormalities of gait and mobility: Secondary | ICD-10-CM

## 2018-11-23 DIAGNOSIS — R296 Repeated falls: Secondary | ICD-10-CM | POA: Diagnosis not present

## 2018-11-23 NOTE — Telephone Encounter (Signed)
Pt last saw [name], NP on 04/23/18, last labs 08/10/18 Creat 0.94, age 73, weight 93.8kg, based on specified criteria pt is on appropriate dosage of Eliquis 5mg  BID.  Will refill rx.

## 2018-11-24 ENCOUNTER — Ambulatory Visit
Admission: RE | Admit: 2018-11-24 | Discharge: 2018-11-24 | Disposition: A | Discharge status: Home / Self Care | Payer: Medicare Other | Source: Ambulatory Visit | Attending: Endocrinology | Admitting: Endocrinology

## 2018-11-24 ENCOUNTER — Ambulatory Visit: Payer: Medicare Other | Admitting: Rehabilitation

## 2018-11-24 DIAGNOSIS — Z1231 Encounter for screening mammogram for malignant neoplasm of breast: Secondary | ICD-10-CM

## 2018-11-24 HISTORY — DX: Personal history of irradiation: Z92.3

## 2018-11-24 NOTE — Therapy (Signed)
[place] [address] [place], [place], [address] Phone: [phone]   Fax:  [phone]  Physical Therapy Treatment  Patient Details  Name: Kayla Harrison MRN: [id number] Date of Birth: March 29, 1946 Referring Provider (PT): Sarina Ill, MD   Encounter Date: 11/23/2018  PT End of Session - 11/24/18 1239    Visit Number  21    Number of Visits  29    Date for PT Re-Evaluation  01/09/19    Authorization Type  Medicare and [employer]-needs 10th visit progress note    PT Start Time  1415    PT Stop Time  1500    PT Time Calculation (min)  45 min    Equipment Utilized During Treatment  Other (comment)   pool noodle used for resistance   Activity Tolerance  Patient tolerated treatment well    Behavior During Therapy  [facility] for tasks assessed/performed       Past Medical History:  Diagnosis Date  . Anemia   . Arthritis    osteoarthritis - knees and right shoulder  . Atrial fibrillation [facility])    ablation- 2x's-- 1st time- [facility], 2nd event at [facility] in 2008. Convergent ablation at [facility] 6/14  . Atrial fibrillation ([place])   . Blood transfusion without reported diagnosis   . Breast cancer ([place])    Dr Margot Chimes, total thyroidectomy- 1999- for cancer  . 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 Chronic bilateral pleural effusions   . Colon polyp    Dr Earlean Shawl  . Complete heart block ([place])   . Complication of anesthesia    Ketamine produces LSD reaction, bright colored nightmarish experience   . Dyslipidemia   . Dyspnea   . Endometriosis   . Fibroids   . H/O pleural effusion    s/p thoracentesis w 3223m withdrawn  . Hepatitis    [place]hepatitis   . History of dysphagia    due to radiation therapy  . History of hiatal hernia    small noted on PET scan  . History of kidney stones   . Hx of thyroid cancer    Dr SForde Dandy . Hyperlipidemia   . Hypertension   . Hypothyroidism   . Lung  cancer, lower lobe ([facility] 01/2017   radiation RX completed 03/04/17; will start chemo 6/27, pt unaware of lung cancer  . Morbid obesity ([place]    Status post lap band surgery  . Nephrolithiasis   . [place] Hodgkin's lymphoma [place]    on chemotherapy  . Persistent atrial fibrillation    a. s/p PVI 2008 b. s/p convergent ablation 23419complicated by bradycardia requiring pacemaker implant  . Personal history of radiation therapy   . Presence of permanent cardiac pacemaker   . Rotator cuff tear    Right  . Sinus node dysfunction ([place]    Complicating convergent ablation 6/14  . [place]    [place]  . SVC syndrome    with lung mass and non hodgkins lymphoma  . Thyroid cancer [place]    Past Surgical History:  Procedure Laterality Date  . ABDOMINAL HYSTERECTOMY  1983  . afib ablation     a. 2008 PVI b. 2014 convergent ablation  . APPENDECTOMY    . BONE MARROW BIOPSY  02/21/2017  . BREAST LUMPECTOMY Left 2010  . bso  1998  . CARDIAC CATHETERIZATION     2015- negative  . CARDIOVERSION  10/09/2012  Procedure: CARDIOVERSION;  Surgeon: Minus Breeding, MD;  Location: [place];  Service: Cardiovascular;  Laterality: N/A;  . CARDIOVERSION  10/09/2012   Procedure: CARDIOVERSION;  Surgeon: Minus Breeding, MD;  Location: [facility] ENDOSCOPY;  Service: Cardiovascular;  Laterality: N/A;  [name] gave the ok to add pt to the add on , but we must check to find out if the can add pt on at 1400 ( [phone])  . CARDIOVERSION N/A 11/20/2012   Procedure: CARDIOVERSION;  Surgeon: Fay Records, MD;  Location: [facility] ENDOSCOPY;  Service: Cardiovascular;  Laterality: N/A;  . CARDIOVERSION N/A 07/18/2017   Procedure: CARDIOVERSION;  Surgeon: Thayer Headings, MD;  Location[facility] ENDOSCOPY;  Service: Cardiovascular;  Laterality: N/A;  . CARDIOVERSION N/A 10/03/2017   Procedure: CARDIOVERSION;  Surgeon: Sanda Klein, MD;  Location: [facility] ENDOSCOPY;  Service: Cardiovascular;  Laterality: N/A;  . CARDIOVERSION N/A  01/07/2018   Procedure: CARDIOVERSION;  Surgeon: Acie Fredrickson Wonda Cheng, MD;  Location: [place];  Service: Cardiovascular;  Laterality: N/A;  . CHOLECYSTECTOMY    . COLONOSCOPY W/ POLYPECTOMY     Dr Earlean Shawl  . CYSTOSCOPY N/A 02/06/2015   Procedure: CYSTOSCOPY;  Surgeon: Kathie Rhodes, MD;  [facility]: [facility];  Service: Urology;  Laterality: N/A;  . CYSTOSCOPY W/ RETROGRADES Left 11/17/2017   Procedure: CYSTOSCOPY WITH RETROGRADE /PYELOGRAM/;  Surgeon: Kathie Rhodes, MD;  Location: [facility];  Service: Urology;  Laterality: Left;  . CYSTOSCOPY WITH RETROGRADE PYELOGRAM, URETEROSCOPY AND STENT PLACEMENT Right 02/06/2015   Procedure: RETROGRADE PYELOGRAM, RIGHT URETEROSCOPY STENT PLACEMENT;  Surgeon: Kathie Rhodes, MD;  Location: [facility];  Service: Urology;  Laterality: Right;  . CYSTOSCOPY WITH RETROGRADE PYELOGRAM, URETEROSCOPY AND STENT PLACEMENT Right 03/07/2017   Procedure: CYSTOSCOPY WITH RIGHT RETROGRADE PYELOGRAM,RIGHT  URETEROSCOPYLASER LITHOTRIPSY  AND STENT PLACEMENT AND STONE BASKETRY;  Surgeon: Kathie Rhodes, MD;  Location: [place];  Service: Urology;  Laterality: Right;  . EYE SURGERY     cataract surgery  . fatty mass removal  1999   pubic area  . HOLMIUM LASER APPLICATION N/A 3/41/9622   Procedure: HOLMIUM LASER APPLICATION;  Surgeon: Kathie Rhodes, MD;  [facility];  Service: Urology;  Laterality: N/A;  . HOLMIUM LASER APPLICATION Right 2/97/9892   Procedure: HOLMIUM LASER APPLICATION;  Surgeon: Kathie Rhodes, MD;  Location: [facility];  Service: Urology;  Laterality: Right;  . HOLMIUM LASER APPLICATION Left 10/11/4172   Procedure: HOLMIUM LASER APPLICATION;  Surgeon: Kathie Rhodes, MD;  Location[facility];  Service: Urology;  Laterality: Left;  . IR FLUORO GUIDE PORT INSERTION RIGHT  02/24/2017  . IR NEPHROSTOMY PLACEMENT RIGHT  11/17/2017  . IR PATIENT EVAL TECH 0-60 MINS  03/11/2017  . IR REMOVAL TUN ACCESS W/ PORT W/O FL MOD SED  04/20/2018  . IR US GUIDE  VASC ACCESS RIGHT  02/24/2017  . KNEE ARTHROSCOPY     bilateral  . LAPAROSCOPIC GASTRIC BANDING  07/10/2010  . LAPAROSCOPIC GASTRIC BANDING     Laparoscopic adjustable banding APS System with posterior hiatal hernia, 2 suture.  Marland Kitchen LAPAROTOMY     for ruptured ovary and ovarian artery   . NEPHROLITHOTOMY Right 11/17/2017   Procedure: NEPHROLITHOTOMY PERCUTANEOUS;  Surgeon: Kathie Rhodes, MD;  [facility];  Service: Urology;  Laterality: Right;  . PACEMAKER INSERTION  03/10/2013   MDT dual chamber PPM  . POCKET REVISION N/A 12/08/2013   Procedure: POCKET REVISION;  Surgeon: Deboraha Sprang, MD;  Location: [facility] CATH LAB;  Service: Cardiovascular;  Laterality: N/A;  .  PORTA CATH INSERTION    . REVERSE SHOULDER ARTHROPLASTY Right 05/14/2018   Procedure: RIGHT REVERSE SHOULDER ARTHROPLASTY;  Surgeon: Tania Ade, MD;  Location: [place];  Service: Orthopedics;  Laterality: Right;  . REVERSE SHOULDER REPLACEMENT Right 05/14/2018  . TEE WITH CARDIOVERSION  09/22/2017  . TEE WITHOUT CARDIOVERSION N/A 10/03/2017   Procedure: TRANSESOPHAGEAL ECHOCARDIOGRAM (TEE);  Surgeon: Sanda Klein, MD;  Location: [place];  Service: Cardiovascular;  Laterality: N/A;  . THYROIDECTOMY  1998   Dr Margot Chimes  . TONSILLECTOMY    . TOTAL KNEE ARTHROPLASTY  04/13/2012   Procedure: TOTAL KNEE ARTHROPLASTY;  Surgeon: Rudean Haskell, MD;  Location: [place];  Service: Orthopedics;  Laterality: Right;  Marland Kitchen VIDEO BRONCHOSCOPY WITH ENDOBRONCHIAL ULTRASOUND N/A 02/07/2017   Procedure: VIDEO BRONCHOSCOPY WITH ENDOBRONCHIAL ULTRASOUND;  Surgeon: Marshell Garfinkel, MD;  Location: [place];  Service: Pulmonary;  Laterality: N/A;    There were no vitals filed for this visit.  Subjective Assessment - 11/24/18 1236    Subjective  Pt presents for aquatic therapy at [facility] - states she is feeling better but did have another episode of atrial fibrillation on Sunday; pt states she has been using bag of peas on left hip and states she thinks it  is helping    Pertinent History  lymphoma (had chemotherapy), vertigo, small vessel disease, arthritis (has had R knee and reverse total shoulder on R)    Limitations  Walking;House hold activities    Patient Stated Goals  "I want to be able to walk in a normal fashion"     Currently in Pain?  No/denies         Aquatic therapy - pool temp 87.0 degrees  Patient seen for aquatic therapy today.  Treatment took place in water 3.5-4 feet deep depending upon activity.  Pt entered the pool via ramp negotiation (ambulation) independently prior to start of scheduled PT session.  Pt exited pool independently via ramp negotiation.  Pt performed amb. In water 4' deep forwards, backwards and sideways 44mwithout UE support (for warm-up)  Added cuff flotation weights on each ankle for 1 rep of forward, backward and sideways amb. Across pool approx. 54 each direction Marching backwards - approx. 52 - use of ankle cuff weights for increased resistance and for perturbations with balance   Pt performed standing hip exercises - hip abduction, extension and flexion with use of ankle cuff flotation weights for increased resistance with  eccentric contraction 10 reps each exercise; standing knee flexion - ankle cuff weights used - 10 reps each leg   Pt performed closed chain Rt & Lt hip extension with foot on noodle - standing with back on pool wall for assist with balance - pushing down on pool noodle With min assist - 10 reps each leg; pt then performed knee flexion with knee extended (foot on noodle) 10 reps each leg with use of pool wall for assist With balance   Pt held onto pool wall with both hands (to simulate use of noodle with this exercise - pt declined performing exercise with noodle due to not wanting to get her hair wet) Performed bicycling LE's, double knee to chest and hip abduction/adduction approx. 5 reps to demo understanding of exercises to perform with use of noodle if so desired   Pt  sat on bench in water - buoyancy of water used for support and also for perturbations with sitting balance to improve trunk control/core stabilization -  pt performed alternate knee extension/flexion (trying not  to use UE support); seated marching x 10 reps each with UE support prn; sit to stand 5 reps from bench without UE support  Pt performed slow marching in place - needed cues to perform slowly to work on SLS on each leg - 10 reps each without UE support  Pt performed Ai Chi postures - 2 postures to facilitate core stabilization and 2 postures to facilitate balance and weight shifting - pt needed intermittent assist for Recovery of LOB with these postures   Pt requires use of water to allow for reduced gait deviation due to reduced joint loading through buoyancy to assist with improved posture without excess pain; buoyancy of water needed for support for balance training in safe environment, with reduced fall risk Pt requires viscosity of water to provide resistance in various directions                    PT Short Term Goals - 10/06/18 1147      PT SHORT TERM GOAL #1   Title  Pt will be independent with initial HEP in order to indicate decreased fall risk and improved functional mobility (Target Date: 09/20/18)    Baseline  met per verbal report    Time  5    Period  Weeks    Status  Achieved      PT SHORT TERM GOAL #2   Title  Pt will perform DGI and improve score 3 points from baseline in order to indicate decreased fall risk.      Baseline  15/24 on 12/10 to 22/24 on 12/31    Time  5    Period  Weeks    Status  Achieved      PT SHORT TERM GOAL #3   Title  Pt will improve gait speed to >/=2.62 ft/sec w/ LRAD in order to indicate safe community ambulator.     Baseline  3.50 ft/sec with cane, 3.64 ft/sec without cane on 09/22/18    Time  5    Period  Weeks    Status  Achieved      PT SHORT TERM GOAL #4   Title  Will assess need for AFO/support for LLE and  request order as appropriate.      Baseline  deferred at this time, no need     Time  5    Period  Weeks    Status  Deferred      PT SHORT TERM GOAL #5   Title  Will assess 6MWT and improve distance by 150' in order to indicate improved functional endurance.     Baseline  859' on 12/10, 1010' on 12/31    Time  5    Period  Weeks    Status  Achieved      PT SHORT TERM GOAL #6   Title  Pt will perform floor recovery at mod A level in order to indiciate improved ability to get up in case of fall.      Baseline  min A on 09/22/18    Time  5    Period  Weeks    Status  Achieved      PT SHORT TERM GOAL #7   Title  Pt will negotiate up/down curb step with LRAD at mod I level in order to indicate safe community negotiation.     Baseline  met 10/06/18    Time  5    Period  Weeks    Status  Achieved  PT Long Term Goals - 11/10/18 1025      PT LONG TERM GOAL #1   Title  Pt will be independent with final HEP in order to indicate decreased fall risk and improved functional mobility.  (Target Date: 12/25/2018)    Baseline  On-going - pt has not yet participated in aquatic therapy     Status  On-going    Target Date  12/25/18      PT LONG TERM GOAL #2   Title  --    Baseline  --    Status  --      PT LONG TERM GOAL #3   Title  Pt will ambulate x 1000' w/o AD over unlevel outdoor paved surfaces at mod I level with decreased gait compensations in order to indicate safe community mobility.      Baseline  met but with gait compensations (Trendelenburg)    Status  Revised      PT LONG TERM GOAL #4   Title  Pt will improve FGA to >/=25/30 in order to indicate decreased fall risk.     Baseline  21/30 on 11-03-18    Status  On-going      PT LONG TERM GOAL #5   Title  Pt will perform floor recovery at mod I level in order to indicate improved ability to get up from floor in case of fall.     Baseline  min assist on 11-03-18    Status  On-going      PT LONG TERM GOAL #6   Title  Pt  will improve 6MWT by 300' from baseline in order to indicate improved functional endurance.     Baseline  1008' on 11/10/2018    Status  On-going            Plan - 11/24/18 1240    Clinical Impression Statement  Pt tolerated aquatic exercises well - session focused on ambulation in water in various directions, LE strengthening, balance and core stabilization exercises.  Used ankle cuff flotation weights and pt reported that she could not tell much difference in resistance with the use of them;  pt did have difficulty maintaining balance with Ai Chi postures, indicative of decreased trunk control; pt needed use of wall for intermittent support to assist with balance, especially with SLS exercises                                                                                                  Rehab Potential  Good    Clinical Impairments Affecting Rehab Potential  neuropathy, uncertain of reason for adipose tissue in BLEs, chronic small vessel disease    PT Frequency  2x / week    PT Duration  6 weeks    PT Treatment/Interventions  ADLs/Self Care Home Management;Aquatic Therapy;DME Instruction;Gait training;Stair training;Functional mobility training;Therapeutic activities;Therapeutic exercise;Balance training;Neuromuscular re-education;Patient/family education;Orthotic Fit/Training;Scar mobilization;Passive range of motion;Dry needling;Vestibular;Manual techniques    PT Next Visit Plan  continue floor recovery, balance, stepping strategy; continue with Lt hip strengthening, balance and gait training    PT [place]  Consulted and Agree with Plan of Care  Patient       Patient will benefit from skilled therapeutic intervention in order to improve the following deficits and impairments:  Decreased activity tolerance, Decreased balance, Decreased coordination, Decreased endurance, Decreased knowledge of use of DME, Decreased mobility, Decreased range of motion, Decreased  strength, Difficulty walking, Dizziness, Hypomobility, Impaired flexibility, Impaired perceived functional ability, Impaired sensation, Impaired UE functional use, Postural dysfunction  Visit Diagnosis: Other abnormalities of gait and mobility  Unsteadiness on feet  Muscle weakness (generalized)     Problem List Patient Active Problem List   Diagnosis Date Noted  . Gait abnormality 07/10/2018  . S/P reverse total shoulder arthroplasty, 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  . Persistent atrial fibrillation   . Bilateral ureteral calculi 11/17/2017  . Atrial fibrillation with RVR ([place]) 09/15/2017  . Atrial flutter ([place]) 07/17/2017  . Port catheter in place 04/02/2017  . Non-Hodgkin lymphoma, unspecified, intrathoracic lymph nodes ([place]) 02/13/2017  . Mediastinal mass 02/06/2017  . 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 Abnormal chest x-ray   . Lung mass 02/03/2017  . 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  . OSA (obstructive sleep apnea) 02/03/2015  . History of renal calculi 11/16/2014  . Renal calculi 11/16/2014  . Family history of colon cancer 10/26/2014  . Mechanical complication due to cardiac pacemaker pulse generator 11/06/2013  . Dyspnea 05/12/2013  . Hypothyroidism 04/21/2013  . Pleural effusion 04/19/2013  . Long term (current) use of anticoagulants 12/20/2010  . EPIDERMOID CYST 08/22/2010  . Chronic diastolic heart failure ([place]) 08/06/2010  . SYNCOPE AND COLLAPSE 07/27/2010  . OSTEOARTHRITIS, KNEE, RIGHT 03/15/2010  . Morbid obesity ([place]) 12/07/2009  . NONSPEC ELEVATION OF LEVELS OF TRANSAMINASE/LDH 09/08/2009  . BREAST CANCER, HX OF 07/25/2009  . COLONIC POLYPS, HX OF 07/25/2009  . TUBULOVILLOUS ADENOMA, COLON 04/29/2008  . HYPERGLYCEMIA, FASTING 04/29/2008  . HYPERLIPIDEMIA 06/22/2007  . CARCINOMA, THYROID GLAND, HX OF 06/22/2007    Alda Lea, PT 11/24/2018, 12:51 PM  [place] [address]  [place], [place], [address] Phone: [phone]   Fax:  [phone]  Name: Kayla Harrison MRN: [id number] Date of Birth: 04/09/1946

## 2018-11-25 ENCOUNTER — Ambulatory Visit: Payer: Medicare Other

## 2018-11-26 ENCOUNTER — Encounter: Payer: Self-pay | Admitting: Rehabilitation

## 2018-11-26 ENCOUNTER — Ambulatory Visit: Payer: Medicare Other | Admitting: Rehabilitation

## 2018-11-26 ENCOUNTER — Telehealth: Payer: Self-pay | Admitting: Internal Medicine

## 2018-11-26 DIAGNOSIS — R2681 Unsteadiness on feet: Secondary | ICD-10-CM | POA: Diagnosis not present

## 2018-11-26 DIAGNOSIS — R296 Repeated falls: Secondary | ICD-10-CM | POA: Diagnosis not present

## 2018-11-26 DIAGNOSIS — R293 Abnormal posture: Secondary | ICD-10-CM

## 2018-11-26 DIAGNOSIS — R2689 Other abnormalities of gait and mobility: Secondary | ICD-10-CM | POA: Diagnosis not present

## 2018-11-26 DIAGNOSIS — M6281 Muscle weakness (generalized): Secondary | ICD-10-CM | POA: Diagnosis not present

## 2018-11-26 NOTE — Telephone Encounter (Signed)
Spoke with pt and reviewed her PPM report. I advised her given her NSR with no arrhythmia events since her last interrogation, she should follow up with her PCP regarding her symptoms. She states her symptoms started when she began Zetia a few weeks ago and wonders if this could be the culprit.  I let her know we did not have any sooner appt's to offer her at this time, however if we have a cancellation I could call her to arrange. She stated she was [facility] with her upcoming appt on 3/23, but will call if she changes her mind.

## 2018-11-26 NOTE — Telephone Encounter (Signed)
Pt called and sent in a remote transmission. She believes she is in A Flutter with HR's in the 90s, SOB and fatigue. I told her we would take a look and call her back after discussing with Dr Caryl Comes.

## 2018-11-26 NOTE — Telephone Encounter (Signed)
New Message  Raquel Sarna is calling from outpatient Therapy and  Pt has had A couple episodes of AFIB. In therapy her BP will drop pretty suddenly and pt will feel dizziness.  Raquel Sarna thinks the pt needs to be seen sooner then scheduled appt.  Please call back

## 2018-11-26 NOTE — Therapy (Signed)
[facility] [address] [place], [place], [address] Phone: [phone]   Fax:  [phone]  Physical Therapy Treatment  Patient Details  Name: Kayla Harrison MRN: [id number] Date of Birth: [birth year] Referring Provider (PT): Sarina Ill, MD   Encounter Date: 11/26/2018  PT End of Session - 11/26/18 1229    Visit Number  22    Number of Visits  29    Date for PT Re-Evaluation  01/09/19    Authorization Type  Medicare and [employer]-needs 10th visit progress note    PT Start Time  1100    PT Stop Time  1145    PT Time Calculation (min)  45 min    Equipment Utilized During Treatment  Other (comment)   pool noodle used for resistance   Activity Tolerance  Patient tolerated treatment well    Behavior During Therapy  [facility] for tasks assessed/performed       Past Medical History:  Diagnosis Date  . Anemia   . Arthritis    osteoarthritis - knees and right shoulder  . Atrial fibrillation [facility])    ablation- 2x's-- 1st time- [facility], 2nd event at [facility] in 2008. Convergent ablation at [facility] 6/14  . Atrial fibrillation ([place])   . Blood transfusion without reported diagnosis   . Breast cancer ([place])    Dr Margot Chimes, total thyroidectomy- 1999- for cancer  . 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 Chronic bilateral pleural effusions   . Colon polyp    Dr Earlean Shawl  . Complete heart block ([place])   . Complication of anesthesia    Ketamine produces LSD reaction, bright colored nightmarish experience   . Dyslipidemia   . Dyspnea   . Endometriosis   . Fibroids   . H/O pleural effusion    s/p thoracentesis w 3233m withdrawn  . Hepatitis    [place]hepatitis   . History of dysphagia    due to radiation therapy  . History of hiatal hernia    small noted on PET scan  . History of kidney stones   . Hx of thyroid cancer    Dr SForde Dandy . Hyperlipidemia   . Hypertension   . Hypothyroidism   . Lung  cancer, lower lobe ([place] 01/2017   radiation RX completed 03/04/17; will start chemo 6/27, pt unaware of lung cancer  . Morbid obesity ([place]    Status post lap band surgery  . Nephrolithiasis   . Non Hodgkin's lymphoma ([place]    on chemotherapy  . Persistent atrial fibrillation    a. s/p PVI 2008 b. s/p convergent ablation 24562complicated by bradycardia requiring pacemaker implant  . Personal history of radiation therapy   . Presence of permanent cardiac pacemaker   . Rotator cuff tear    Right  . Sinus node dysfunction ([place]    Complicating convergent ablation 6/14  . [place]    [place]  . SVC syndrome    with lung mass and non hodgkins lymphoma  . Thyroid cancer ([place] 2000    Past Surgical History:  Procedure Laterality Date  . ABDOMINAL HYSTERECTOMY  1983  . afib ablation     a. 2008 PVI b. 2014 convergent ablation  . APPENDECTOMY    . BONE MARROW BIOPSY  02/21/2017  . BREAST LUMPECTOMY Left 2010  . bso  1998  . CARDIAC CATHETERIZATION     2015- negative  . CARDIOVERSION  10/09/2012  Procedure: CARDIOVERSION;  Surgeon: Minus Breeding, MD;  Location: [facility];  Service: Cardiovascular;  Laterality: N/A;  . CARDIOVERSION  10/09/2012   Procedure: CARDIOVERSION;  Surgeon: Minus Breeding, MD;  Location: [facility] ENDOSCOPY;  Service: Cardiovascular;  Laterality: N/A;  [name] gave the ok to add pt to the add on , but we must check to find out if the can add pt on at [phone] ( [phone])  . CARDIOVERSION N/A 11/20/2012   Procedure: CARDIOVERSION;  Surgeon: Fay Records, MD;  Location: [facility] ENDOSCOPY;  Service: Cardiovascular;  Laterality: N/A;  . CARDIOVERSION N/A 07/18/2017   Procedure: CARDIOVERSION;  Surgeon: Thayer Headings, MD;  Location: [facility] ENDOSCOPY;  Service: Cardiovascular;  Laterality: N/A;  . CARDIOVERSION N/A 10/03/2017   Procedure: CARDIOVERSION;  Surgeon: Sanda Klein, MD;  Location: [facility] ENDOSCOPY;  Service: Cardiovascular;  Laterality: N/A;  . CARDIOVERSION N/A  01/07/2018   Procedure: CARDIOVERSION;  Surgeon: Acie Fredrickson Wonda Cheng, MD;  Location: [place];  Service: Cardiovascular;  Laterality: N/A;  . CHOLECYSTECTOMY    . COLONOSCOPY W/ POLYPECTOMY     Dr Earlean Shawl  . CYSTOSCOPY N/A 02/06/2015   Procedure: CYSTOSCOPY;  Surgeon: Kathie Rhodes, MD;  Location: [facility];  Service: Urology;  Laterality: N/A;  . CYSTOSCOPY W/ RETROGRADES Left 11/17/2017   Procedure: CYSTOSCOPY WITH RETROGRADE /PYELOGRAM/;  Surgeon: Kathie Rhodes, MD;  Location: [facility];  Service: Urology;  Laterality: Left;  . CYSTOSCOPY WITH RETROGRADE PYELOGRAM, URETEROSCOPY AND STENT PLACEMENT Right 02/06/2015   Procedure: RETROGRADE PYELOGRAM, RIGHT URETEROSCOPY STENT PLACEMENT;  Surgeon: Kathie Rhodes, MD;  Location: [facility];  Service: Urology;  Laterality: Right;  . CYSTOSCOPY WITH RETROGRADE PYELOGRAM, URETEROSCOPY AND STENT PLACEMENT Right 03/07/2017   Procedure: CYSTOSCOPY WITH RIGHT RETROGRADE PYELOGRAM,RIGHT  URETEROSCOPYLASER LITHOTRIPSY  AND STENT PLACEMENT AND STONE BASKETRY;  Surgeon: Kathie Rhodes, MD;  Location: [place];  Service: Urology;  Laterality: Right;  . EYE SURGERY     cataract surgery  . fatty mass removal  1999   pubic area  . HOLMIUM LASER APPLICATION N/A 7/32/2025   Procedure: HOLMIUM LASER APPLICATION;  Surgeon: Kathie Rhodes, MD;  [facility]: [facility];  Service: Urology;  Laterality: N/A;  . HOLMIUM LASER APPLICATION Right 01/17/622   Procedure: HOLMIUM LASER APPLICATION;  Surgeon: Kathie Rhodes, MD;  Location: [facility];  Service: Urology;  Laterality: Right;  . HOLMIUM LASER APPLICATION Left 7/62/8315   Procedure: HOLMIUM LASER APPLICATION;  Surgeon: Kathie Rhodes, MD;  [facility]: [facility];  Service: Urology;  Laterality: Left;  . IR FLUORO GUIDE PORT INSERTION RIGHT  02/24/2017  . IR NEPHROSTOMY PLACEMENT RIGHT  11/17/2017  . IR PATIENT EVAL TECH 0-60 MINS  03/11/2017  . IR REMOVAL TUN ACCESS W/ PORT W/O FL MOD SED  04/20/2018  . IR US GUIDE  VASC ACCESS RIGHT  02/24/2017  . KNEE ARTHROSCOPY     bilateral  . LAPAROSCOPIC GASTRIC BANDING  07/10/2010  . LAPAROSCOPIC GASTRIC BANDING     Laparoscopic adjustable banding APS System with posterior hiatal hernia, 2 suture.  Marland Kitchen LAPAROTOMY     for ruptured ovary and ovarian artery   . NEPHROLITHOTOMY Right 11/17/2017   Procedure: NEPHROLITHOTOMY PERCUTANEOUS;  Surgeon: Kathie Rhodes, MD;  [facility];  Service: Urology;  Laterality: Right;  . PACEMAKER INSERTION  03/10/2013   MDT dual chamber PPM  . POCKET REVISION N/A 12/08/2013   Procedure: POCKET REVISION;  Surgeon: Deboraha Sprang, MD;  Location[facility] CATH LAB;  Service: Cardiovascular;  Laterality: N/A;  .  PORTA CATH INSERTION    . REVERSE SHOULDER ARTHROPLASTY Right 05/14/2018   Procedure: RIGHT REVERSE SHOULDER ARTHROPLASTY;  Surgeon: Tania Ade, MD;  Location: [facility];  Service: Orthopedics;  Laterality: Right;  . REVERSE SHOULDER REPLACEMENT Right 05/14/2018  . TEE WITH CARDIOVERSION  09/22/2017  . TEE WITHOUT CARDIOVERSION N/A 10/03/2017   Procedure: TRANSESOPHAGEAL ECHOCARDIOGRAM (TEE);  Surgeon: Sanda Klein, MD;  Location: [place];  Service: Cardiovascular;  Laterality: N/A;  . THYROIDECTOMY  1998   Dr Margot Chimes  . TONSILLECTOMY    . TOTAL KNEE ARTHROPLASTY  04/13/2012   Procedure: TOTAL KNEE ARTHROPLASTY;  Surgeon: Rudean Haskell, MD;  Location: [facility];  Service: Orthopedics;  Laterality: Right;  Marland Kitchen VIDEO BRONCHOSCOPY WITH ENDOBRONCHIAL ULTRASOUND N/A 02/07/2017   Procedure: VIDEO BRONCHOSCOPY WITH ENDOBRONCHIAL ULTRASOUND;  Surgeon: Marshell Garfinkel, MD;  Location: [place];  Service: Pulmonary;  Laterality: N/A;    There were no vitals filed for this visit.  Subjective Assessment - 11/26/18 1103    Subjective  Pt reports good work out at the pool.  She still has just not been feeling right.      Pertinent History  lymphoma (had chemotherapy), vertigo, small vessel disease, arthritis (has had R knee and reverse total  shoulder on R)    Patient Stated Goals  "I want to be able to walk in a normal fashion"     Currently in Pain?  No/denies                       [facility] Adult PT Treatment/Exercise - 11/26/18 1105      Self-Care   Self-Care  Other Self-Care Comments    Other Self-Care Comments   Pt continues to have light headedness, headaches and extreme fatigue.  She was unable to perform very many tasks during session due to these feelings.  PT contacted cardiac MD office during session.  PT spoke with Network engineer and is to transfer message to nurse and will contact pt.        Therapeutic Activites    Therapeutic Activities  Other Therapeutic Activities    Other Therapeutic Activities  Pt performed floor to stand transfer today with her yoga mat.  Note that she went into R side sit today (normally do L side sit) and therefore due to R shoulder issues, was unable to use UEs as much for leverage and needed to get into quadruped prior to tall kneeling.  pt with MARKED knee pain today as R knee landed on edge of doubled yoga mat.  PT provided mod A at times during transfer.        Neuro Re-ed    Neuro Re-ed Details   High level balance and strengthening:  Standing in // bars on foam airex with one LE in SLS while opposite extremity performed hip abd/ext x 10 reps then hip flex x 10 reps with blue theraband. Pt at times had to use maximal effort to complete task and became very fatigued and with headache.  Allowed pt to sit and checked vitals.  BP was 117/82.  HR was 78.  Allowed rest break before continuing on foam airex performing alternating cone taps x 10 reps, double cone taps (crossing over) x 10 reps and tipping cone over and back x 5 reps on each side.  Pt again feeling extrememly fatigued, therefore allowed rest and contacted cardiac MD to see if she could get in sooner than 2 more weeks.  PT Education - 11/26/18 1229    Education Details  see self care     Person(s) Educated   Patient    Methods  Explanation    Comprehension  Verbalized understanding       PT Short Term Goals - 10/06/18 1147      PT SHORT TERM GOAL #1   Title  Pt will be independent with initial HEP in order to indicate decreased fall risk and improved functional mobility (Target Date: 09/20/18)    Baseline  met per verbal report    Time  5    Period  Weeks    Status  Achieved      PT SHORT TERM GOAL #2   Title  Pt will perform DGI and improve score 3 points from baseline in order to indicate decreased fall risk.      Baseline  15/24 on 12/10 to 22/24 on 12/31    Time  5    Period  Weeks    Status  Achieved      PT SHORT TERM GOAL #3   Title  Pt will improve gait speed to >/=2.62 ft/sec w/ LRAD in order to indicate safe community ambulator.     Baseline  3.50 ft/sec with cane, 3.64 ft/sec without cane on 09/22/18    Time  5    Period  Weeks    Status  Achieved      PT SHORT TERM GOAL #4   Title  Will assess need for AFO/support for LLE and request order as appropriate.      Baseline  deferred at this time, no need     Time  5    Period  Weeks    Status  Deferred      PT SHORT TERM GOAL #5   Title  Will assess 6MWT and improve distance by 150' in order to indicate improved functional endurance.     Baseline  859' on 12/10, 1010' on 12/31    Time  5    Period  Weeks    Status  Achieved      PT SHORT TERM GOAL #6   Title  Pt will perform floor recovery at mod A level in order to indiciate improved ability to get up in case of fall.      Baseline  min A on 09/22/18    Time  5    Period  Weeks    Status  Achieved      PT SHORT TERM GOAL #7   Title  Pt will negotiate up/down curb step with LRAD at mod I level in order to indicate safe community negotiation.     Baseline  met 10/06/18    Time  5    Period  Weeks    Status  Achieved        PT Long Term Goals - 11/10/18 1025      PT LONG TERM GOAL #1   Title  Pt will be independent with final HEP in order to indicate  decreased fall risk and improved functional mobility.  (Target Date: 12/25/2018)    Baseline  On-going - pt has not yet participated in aquatic therapy     Status  On-going    Target Date  12/25/18      PT LONG TERM GOAL #2   Title  --    Baseline  --    Status  --      PT LONG TERM GOAL #3  Title  Pt will ambulate x 1000' w/o AD over unlevel outdoor paved surfaces at mod I level with decreased gait compensations in order to indicate safe community mobility.      Baseline  met but with gait compensations (Trendelenburg)    Status  Revised      PT LONG TERM GOAL #4   Title  Pt will improve FGA to >/=25/30 in order to indicate decreased fall risk.     Baseline  21/30 on 11-03-18    Status  On-going      PT LONG TERM GOAL #5   Title  Pt will perform floor recovery at mod I level in order to indicate improved ability to get up from floor in case of fall.     Baseline  min assist on 11-03-18    Status  On-going      PT LONG TERM GOAL #6   Title  Pt will improve 6MWT by 300' from baseline in order to indicate improved functional endurance.     Baseline  1008' on 11/10/2018    Status  On-going            Plan - 11/26/18 1230    Clinical Impression Statement  Pt with max difficulty with floor to stand today as she performed from R side sit instead of L side sit.  Also attempted high level balance with strengthening, however due to extreme fatigue and feeling headache, some light headedness, PT discontinued activity.  PT contacted cardiac MD office during session and they are to contact pt after nurse/MD can see information.      Rehab Potential  Good    Clinical Impairments Affecting Rehab Potential  neuropathy, uncertain of reason for adipose tissue in BLEs, chronic small vessel disease    PT Frequency  2x / week    PT Duration  6 weeks    PT Treatment/Interventions  ADLs/Self Care Home Management;Aquatic Therapy;DME Instruction;Gait training;Stair training;Functional mobility  training;Therapeutic activities;Therapeutic exercise;Balance training;Neuromuscular re-education;Patient/family education;Orthotic Fit/Training;Scar mobilization;Passive range of motion;Dry needling;Vestibular;Manual techniques    PT Next Visit Plan  Was she able to get into cardiac MD sooner?? continue floor recovery, balance, stepping strategy; continue with Lt hip strengthening, balance and gait training    PT Home Exercise Plan  [id number]    Consulted and Agree with Plan of Care  Patient       Patient will benefit from skilled therapeutic intervention in order to improve the following deficits and impairments:  Decreased activity tolerance, Decreased balance, Decreased coordination, Decreased endurance, Decreased knowledge of use of DME, Decreased mobility, Decreased range of motion, Decreased strength, Difficulty walking, Dizziness, Hypomobility, Impaired flexibility, Impaired perceived functional ability, Impaired sensation, Impaired UE functional use, Postural dysfunction  Visit Diagnosis: Other abnormalities of gait and mobility  Unsteadiness on feet  Muscle weakness (generalized)  Abnormal posture  Repeated falls     Problem List Patient Active Problem List   Diagnosis Date Noted  . Gait abnormality 07/10/2018  . S/P reverse total shoulder arthroplasty, 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  . Persistent atrial fibrillation   . Bilateral ureteral calculi 11/17/2017  . Atrial fibrillation with RVR ([place]) 09/15/2017  . Atrial flutter ([place]) 07/17/2017  . Port catheter in place 04/02/2017  . Non-Hodgkin lymphoma, unspecified, intrathoracic lymph nodes ([place]) 02/13/2017  . Mediastinal mass 02/06/2017  . 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 Abnormal chest x-ray   . Lung mass 02/03/2017  . 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  . OSA (obstructive sleep apnea) 02/03/2015  .  History of renal calculi 11/16/2014  . Renal calculi 11/16/2014  . Family history of colon cancer 10/26/2014   . Mechanical complication due to cardiac pacemaker pulse generator 11/06/2013  . Dyspnea 05/12/2013  . Hypothyroidism 04/21/2013  . Pleural effusion 04/19/2013  . Long term (current) use of anticoagulants 12/20/2010  . EPIDERMOID CYST 08/22/2010  . Chronic diastolic heart failure ([place]) 08/06/2010  . SYNCOPE AND COLLAPSE 07/27/2010  . OSTEOARTHRITIS, KNEE, RIGHT 03/15/2010  . Morbid obesity ([place]) 12/07/2009  . NONSPEC ELEVATION OF LEVELS OF TRANSAMINASE/LDH 09/08/2009  . BREAST CANCER, HX OF 07/25/2009  . COLONIC POLYPS, HX OF 07/25/2009  . TUBULOVILLOUS ADENOMA, COLON 04/29/2008  . HYPERGLYCEMIA, FASTING 04/29/2008  . HYPERLIPIDEMIA 06/22/2007  . CARCINOMA, THYROID GLAND, HX OF 06/22/2007    Cameron Sprang, PT, MPT [facility] [address] [place], [place], [address] Phone: [phone]   Fax:  [phone] 11/26/18, 12:32 PM  Name: Kayla Harrison MRN: [id number] Date of Birth: 1946-04-25

## 2018-11-26 NOTE — Telephone Encounter (Signed)
LVM for pt to return my call and send in a remote transmission.

## 2018-11-26 NOTE — Telephone Encounter (Signed)
Follow up:   Patient returning call back. Please call patient

## 2018-11-26 NOTE — Telephone Encounter (Signed)
Transmission reviewed. Normal device function. Pt in AP/VS rhythm. No arrhythmias since last device interrogation.   Chanetta Marshall, NP 11/26/2018 2:16 PM

## 2018-11-27 NOTE — Telephone Encounter (Signed)
Pt is aware of Dr Olin Pia recommendation. She stated she had already discontinued it's use today and feels better. She had no additional needs of concerns.

## 2018-11-27 NOTE — Telephone Encounter (Signed)
I would have her stop the zetia  Thanks

## 2018-12-01 ENCOUNTER — Ambulatory Visit: Payer: Medicare Other | Admitting: Rehabilitation

## 2018-12-03 ENCOUNTER — Ambulatory Visit: Payer: Medicare Other | Admitting: Physical Therapy

## 2018-12-03 DIAGNOSIS — R293 Abnormal posture: Secondary | ICD-10-CM | POA: Diagnosis not present

## 2018-12-03 DIAGNOSIS — R296 Repeated falls: Secondary | ICD-10-CM | POA: Diagnosis not present

## 2018-12-03 DIAGNOSIS — R2681 Unsteadiness on feet: Secondary | ICD-10-CM | POA: Diagnosis not present

## 2018-12-03 DIAGNOSIS — R2689 Other abnormalities of gait and mobility: Secondary | ICD-10-CM

## 2018-12-03 DIAGNOSIS — M6281 Muscle weakness (generalized): Secondary | ICD-10-CM | POA: Diagnosis not present

## 2018-12-03 NOTE — Patient Instructions (Signed)
Standing Marching   Using a chair if necessary, march in place. Repeat 10 times. Do 1 sessions per day.     Hip Backward Kick   Using a chair for balance, keep legs shoulder width apart and toes pointed for- ward. Slowly extend one leg back, keeping knee straight. Do not lean forward. Repeat with other leg. Repeat 10 times. Do 1  sessions per day.  ALTERNATE LEGS     Hip Side Kick   Holding a chair for balance, keep legs shoulder width apart and toes pointed forward. Swing a leg out to side, keeping knee straight. Do not lean. Repeat using other leg. Repeat 10 times. Do 1 sessions per day.  ALSO DO FORWARD KICKS - 10 reps alternating legs - do exs. standing at counter for upper extremity support as needed      Standing On One Leg Without Support .  Stand on one leg in neutral spine without support. Hold 10 seconds.  HOLD ONTO COUNTER FOR SUPPORT Repeat on other leg. Do 2 repetitions, 1 sets.  [email or web address].[email or web address]

## 2018-12-04 ENCOUNTER — Encounter: Payer: Self-pay | Admitting: Physical Therapy

## 2018-12-04 NOTE — Therapy (Signed)
[facility] [address] [place], [place], [address] Phone: [phone]   Fax:  [phone]  Physical Therapy Treatment  Patient Details  Name: Kayla Harrison MRN: [id number] Date of Birth: [birth year] Referring Provider (PT): Sarina Ill, MD   Encounter Date: 12/03/2018  PT End of Session - 12/04/18 1742    Visit Number  23    Number of Visits  29    Date for PT Re-Evaluation  01/09/19    Authorization Type  Medicare and [employer]-needs 10th visit progress note    PT Start Time  1315    PT Stop Time  1400    PT Time Calculation (min)  45 min    Activity Tolerance  Patient limited by fatigue    Behavior During Therapy  [facility] for tasks assessed/performed       Past Medical History:  Diagnosis Date  . Anemia   . Arthritis    osteoarthritis - knees and right shoulder  . Atrial fibrillation [facility])    ablation- 2x's-- 1st time- [facility], 2nd event at [facility] in 2008. Convergent ablation at [facility] 6/14  . Atrial fibrillation ([place])   . Blood transfusion without reported diagnosis   . Breast cancer ([place])    Dr Margot Chimes, total thyroidectomy- 1999- for cancer  . 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 Chronic bilateral pleural effusions   . Colon polyp    Dr Earlean Shawl  . Complete heart block ([place])   . Complication of anesthesia    Ketamine produces LSD reaction, bright colored nightmarish experience   . Dyslipidemia   . Dyspnea   . Endometriosis   . Fibroids   . H/O pleural effusion    s/p thoracentesis w 3212m withdrawn  . Hepatitis    [place]hepatitis   . History of dysphagia    due to radiation therapy  . History of hiatal hernia    small noted on PET scan  . History of kidney stones   . Hx of thyroid cancer    Dr SForde Dandy . Hyperlipidemia   . Hypertension   . Hypothyroidism   . Lung cancer, lower lobe ([place] 01[place]   radiation RX completed 03/04/17; will start chemo 6/27, pt unaware  of lung cancer  . Morbid obesity ([place]    Status post lap band surgery  . Nephrolithiasis   . Non Hodgkin's lymphoma ([place]    on chemotherapy  . Persistent atrial fibrillation    a. s/p PVI 2008 b. s/p convergent ablation [id number] by bradycardia requiring pacemaker implant  . Personal history of radiation therapy   . Presence of permanent cardiac pacemaker   . Rotator cuff tear    Right  . Sinus node dysfunction ([place]    Complicating convergent ablation 6/14  . [place]    [place]  . SVC syndrome    with lung mass and non hodgkins lymphoma  . Thyroid cancer ([place] 2000    Past Surgical History:  Procedure Laterality Date  . ABDOMINAL HYSTERECTOMY  1983  . afib ablation     a. 2008 PVI b. 2014 convergent ablation  . APPENDECTOMY    . BONE MARROW BIOPSY  02/21/2017  . BREAST LUMPECTOMY Left 2010  . bso  1998  . CARDIAC CATHETERIZATION     2015- negative  . CARDIOVERSION  10/09/2012   Procedure: CARDIOVERSION;  Surgeon: JMinus Breeding MD;  [place]  Service: Cardiovascular;  Laterality: N/A;  . CARDIOVERSION  10/09/2012   Procedure: CARDIOVERSION;  Surgeon: Minus Breeding, MD;  Location: [facility] ENDOSCOPY;  Service: Cardiovascular;  Laterality: N/A;  [name] gave the ok to add pt to the add on , but we must check to find out if the can add pt on at [phone] ( [phone])  . CARDIOVERSION N/A 11/20/2012   Procedure: CARDIOVERSION;  Surgeon: Fay Records, MD;  Location[facility]COPY;  Service: Cardiovascular;  Laterality: N/A;  . CARDIOVERSION N/A 07/18/2017   Procedure: CARDIOVERSION;  Surgeon: Thayer Headings, MD;  Location[facility] ENDOSCOPY;  Service: Cardiovascular;  Laterality: N/A;  . CARDIOVERSION N/A 10/03/2017   Procedure: CARDIOVERSION;  Surgeon: Sanda Klein, MD;  Location: [facility] ENDOSCOPY;  Service: Cardiovascular;  Laterality: N/A;  . CARDIOVERSION N/A 01/07/2018   Procedure: CARDIOVERSION;  Surgeon: Acie Fredrickson Wonda Cheng, MD;  Location: [facility];  Service:  Cardiovascular;  Laterality: N/A;  . CHOLECYSTECTOMY    . COLONOSCOPY W/ POLYPECTOMY     Dr Earlean Shawl  . CYSTOSCOPY N/A 02/06/2015   Procedure: CYSTOSCOPY;  Surgeon: Kathie Rhodes, MD;  [facility];  Service: Urology;  Laterality: N/A;  . CYSTOSCOPY W/ RETROGRADES Left 11/17/2017   Procedure: CYSTOSCOPY WITH RETROGRADE /PYELOGRAM/;  Surgeon: Kathie Rhodes, MD;  [facility];  Service: Urology;  Laterality: Left;  . CYSTOSCOPY WITH RETROGRADE PYELOGRAM, URETEROSCOPY AND STENT PLACEMENT Right 02/06/2015   Procedure: RETROGRADE PYELOGRAM, RIGHT URETEROSCOPY STENT PLACEMENT;  Surgeon: Kathie Rhodes, MD;  [facility];  Service: Urology;  Laterality: Right;  . CYSTOSCOPY WITH RETROGRADE PYELOGRAM, URETEROSCOPY AND STENT PLACEMENT Right 03/07/2017   Procedure: CYSTOSCOPY WITH RIGHT RETROGRADE PYELOGRAM,RIGHT  URETEROSCOPYLASER LITHOTRIPSY  AND STENT PLACEMENT AND STONE BASKETRY;  Surgeon: Kathie Rhodes, MD;  Location: [facility];  Service: Urology;  Laterality: Right;  . EYE SURGERY     cataract surgery  . fatty mass removal  1999   pubic area  . HOLMIUM LASER APPLICATION N/A 3/55/7322   Procedure: HOLMIUM LASER APPLICATION;  Surgeon: Kathie Rhodes, MD;  [facility];  Service: Urology;  Laterality: N/A;  . HOLMIUM LASER APPLICATION Right 0/25/4270   Procedure: HOLMIUM LASER APPLICATION;  Surgeon: Kathie Rhodes, MD;  Location: [facility];  Service: Urology;  Laterality: Right;  . HOLMIUM LASER APPLICATION Left 03/16/7627   Procedure: HOLMIUM LASER APPLICATION;  Surgeon: Kathie Rhodes, MD;  [facility];  Service: Urology;  Laterality: Left;  . IR FLUORO GUIDE PORT INSERTION RIGHT  02/24/2017  . IR NEPHROSTOMY PLACEMENT RIGHT  11/17/2017  . IR PATIENT EVAL TECH 0-60 MINS  03/11/2017  . IR REMOVAL TUN ACCESS W/ PORT W/O FL MOD SED  04/20/2018  . IR US GUIDE VASC ACCESS RIGHT  02/24/2017  . KNEE ARTHROSCOPY     bilateral  . LAPAROSCOPIC GASTRIC BANDING   07/10/2010  . LAPAROSCOPIC GASTRIC BANDING     Laparoscopic adjustable banding APS System with posterior hiatal hernia, 2 suture.  Marland Kitchen LAPAROTOMY     for ruptured ovary and ovarian artery   . NEPHROLITHOTOMY Right 11/17/2017   Procedure: NEPHROLITHOTOMY PERCUTANEOUS;  Surgeon: Kathie Rhodes, MD;  [facility];  Service: Urology;  Laterality: Right;  . PACEMAKER INSERTION  03/10/2013   MDT dual chamber PPM  . POCKET REVISION N/A 12/08/2013   Procedure: POCKET REVISION;  Surgeon: Deboraha Sprang, MD;  Location: [facility] CATH LAB;  Service: Cardiovascular;  Laterality: N/A;  . PORTA CATH INSERTION    . REVERSE SHOULDER ARTHROPLASTY Right 05/14/2018   Procedure:  RIGHT REVERSE SHOULDER ARTHROPLASTY;  Surgeon: Tania Ade, MD;  Location: [facility];  Service: Orthopedics;  Laterality: Right;  . REVERSE SHOULDER REPLACEMENT Right 05/14/2018  . TEE WITH CARDIOVERSION  09/22/2017  . TEE WITHOUT CARDIOVERSION N/A 10/03/2017   Procedure: TRANSESOPHAGEAL ECHOCARDIOGRAM (TEE);  Surgeon: Sanda Klein, MD;  Location: [place];  Service: Cardiovascular;  Laterality: N/A;  . THYROIDECTOMY  1998   Dr Margot Chimes  . TONSILLECTOMY    . TOTAL KNEE ARTHROPLASTY  04/13/2012   Procedure: TOTAL KNEE ARTHROPLASTY;  Surgeon: Rudean Haskell, MD;  Location: [facility];  Service: Orthopedics;  Laterality: Right;  Marland Kitchen VIDEO BRONCHOSCOPY WITH ENDOBRONCHIAL ULTRASOUND N/A 02/07/2017   Procedure: VIDEO BRONCHOSCOPY WITH ENDOBRONCHIAL ULTRASOUND;  Surgeon: Marshell Garfinkel, MD;  Location: [place];  Service: Pulmonary;  Laterality: N/A;    There were no vitals filed for this visit.  Subjective Assessment - 12/04/18 1726    Subjective  Pt reports she quit taking Zetia medication and this has helped; states she has not had any episodes of atrial fibrillation; was unable to get her cardiologist appt moved up    Pertinent History  lymphoma (had chemotherapy), vertigo, small vessel disease, arthritis (has had R knee and reverse total shoulder  on R)    Limitations  Walking;House hold activities    Patient Stated Goals  "I want to be able to walk in a normal fashion"     Currently in Pain?  No/denies                       [facility] Adult PT Treatment/Exercise - 12/04/18 0001      Transfers   Transfers  Sit to Stand    Number of Reps  Other reps (comment)   5 reps   Comments  no UE support - from mat table      Neuro Re-ed    Neuro Re-ed Details   Pt performed standing balance exercises inside // bars - forward, back and side kicks 10 reps alternating legs with UE support:  marching in place 10 reps each leg;  SLS on each leg                                                  sit to stand 5 reps with feet on Airex - no UE support Marching on Airex - 10 reps each leg with CGA Tapping down to floor 5 reps each foot (standing on Airex) - alternating  - with min assist for safety and recovery of LOB    Balance Exercises - 12/04/18 1757      Balance Exercises: Standing   SLS  Eyes open;2 reps;10 secs    Stepping Strategy  Anterior;Lateral;10 reps   each leg    Rockerboard  Anterior/posterior;EO;10 reps;UE support;Intermittent UE support   2 sets - 2nd set with less UE support    Step Ups  Forward;4 inch;UE support 2   5 reps each leg due to fatigue   Sidestepping  2 reps   inside // bars    Step Over Hurdles / Cones  stepping over balance beam         PT Education - 12/04/18 1742    Education Details  added standing balance exercises to HEP    Person(s) Educated  Patient    Methods  Explanation;Demonstration;Handout  Comprehension  Verbalized understanding;Returned demonstration       PT Short Term Goals - 10/06/18 1147      PT SHORT TERM GOAL #1   Title  Pt will be independent with initial HEP in order to indicate decreased fall risk and improved functional mobility (Target Date: 09/20/18)    Baseline  met per verbal report    Time  5    Period  Weeks    Status  Achieved      PT SHORT TERM  GOAL #2   Title  Pt will perform DGI and improve score 3 points from baseline in order to indicate decreased fall risk.      Baseline  15/24 on 12/10 to 22/24 on 12/31    Time  5    Period  Weeks    Status  Achieved      PT SHORT TERM GOAL #3   Title  Pt will improve gait speed to >/=2.62 ft/sec w/ LRAD in order to indicate safe community ambulator.     Baseline  3.50 ft/sec with cane, 3.64 ft/sec without cane on 09/22/18    Time  5    Period  Weeks    Status  Achieved      PT SHORT TERM GOAL #4   Title  Will assess need for AFO/support for LLE and request order as appropriate.      Baseline  deferred at this time, no need     Time  5    Period  Weeks    Status  Deferred      PT SHORT TERM GOAL #5   Title  Will assess 6MWT and improve distance by 150' in order to indicate improved functional endurance.     Baseline  859' on 12/10, 1010' on 12/31    Time  5    Period  Weeks    Status  Achieved      PT SHORT TERM GOAL #6   Title  Pt will perform floor recovery at mod A level in order to indiciate improved ability to get up in case of fall.      Baseline  min A on 09/22/18    Time  5    Period  Weeks    Status  Achieved      PT SHORT TERM GOAL #7   Title  Pt will negotiate up/down curb step with LRAD at mod I level in order to indicate safe community negotiation.     Baseline  met 10/06/18    Time  5    Period  Weeks    Status  Achieved        PT Long Term Goals - 11/10/18 1025      PT LONG TERM GOAL #1   Title  Pt will be independent with final HEP in order to indicate decreased fall risk and improved functional mobility.  (Target Date: 12/25/2018)    Baseline  On-going - pt has not yet participated in aquatic therapy     Status  On-going    Target Date  12/25/18      PT LONG TERM GOAL #2   Title  --    Baseline  --    Status  --      PT LONG TERM GOAL #3   Title  Pt will ambulate x 1000' w/o AD over unlevel outdoor paved surfaces at mod I level with decreased  gait compensations in order to indicate safe community mobility.  Baseline  met but with gait compensations (Trendelenburg)    Status  Revised      PT LONG TERM GOAL #4   Title  Pt will improve FGA to >/=25/30 in order to indicate decreased fall risk.     Baseline  21/30 on 11-03-18    Status  On-going      PT LONG TERM GOAL #5   Title  Pt will perform floor recovery at mod I level in order to indicate improved ability to get up from floor in case of fall.     Baseline  min assist on 11-03-18    Status  On-going      PT LONG TERM GOAL #6   Title  Pt will improve 6MWT by 300' from baseline in order to indicate improved functional endurance.     Baseline  1008' on 11/10/2018    Status  On-going            Plan - 12/04/18 1744    Clinical Impression Statement  Pt states she was unable to get her cardiac appt moved up; session focused on balance training (per pt's request) but pt became very fatigued and required frequent seated rest breaks with standing balance exercises performed inside // bars:  pt states the standing balance exs. makes her feel exhausted.  Pt did c/o pain in Lt hip (lump palpated) with LLE weight bearing.  Pt attributes this "lump" to injury sustained after a fall many years ago.      Rehab Potential  Good    Clinical Impairments Affecting Rehab Potential  neuropathy, uncertain of reason for adipose tissue in BLEs, chronic small vessel disease    PT Frequency  2x / week    PT Duration  6 weeks    PT Treatment/Interventions  ADLs/Self Care Home Management;Aquatic Therapy;DME Instruction;Gait training;Stair training;Functional mobility training;Therapeutic activities;Therapeutic exercise;Balance training;Neuromuscular re-education;Patient/family education;Orthotic Fit/Training;Scar mobilization;Passive range of motion;Dry needling;Vestibular;Manual techniques    PT Next Visit Plan  Check balance exs for HEP (given 12-03-18) continue floor recovery, balance, stepping  strategy; continue with Lt hip strengthening, balance and gait training    PT Home Exercise Plan  [id number]; balance HEP given on 12-03-18    Consulted and Agree with Plan of Care  Patient       Patient will benefit from skilled therapeutic intervention in order to improve the following deficits and impairments:  Decreased activity tolerance, Decreased balance, Decreased coordination, Decreased endurance, Decreased knowledge of use of DME, Decreased mobility, Decreased range of motion, Decreased strength, Difficulty walking, Dizziness, Hypomobility, Impaired flexibility, Impaired perceived functional ability, Impaired sensation, Impaired UE functional use, Postural dysfunction  Visit Diagnosis: Other abnormalities of gait and mobility  Unsteadiness on feet     Problem List Patient Active Problem List   Diagnosis Date Noted  . Gait abnormality 07/10/2018  . S/P reverse total shoulder arthroplasty, 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  . Persistent atrial fibrillation   . Bilateral ureteral calculi 11/17/2017  . Atrial fibrillation with RVR ([place]) 09/15/2017  . Atrial flutter ([place]) 07/17/2017  . Port catheter in place 04/02/2017  . Non-Hodgkin lymphoma, unspecified, intrathoracic lymph nodes ([place]) 02/13/2017  . Mediastinal mass 02/06/2017  . 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 Abnormal chest x-ray   . Lung mass 02/03/2017  . 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  . OSA (obstructive sleep apnea) 02/03/2015  . History of renal calculi 11/16/2014  . Renal calculi 11/16/2014  . Family history of colon cancer 10/26/2014  . Mechanical complication due to  cardiac pacemaker pulse generator 11/06/2013  . Dyspnea 05/12/2013  . Hypothyroidism 04/21/2013  . Pleural effusion 04/19/2013  . Long term (current) use of anticoagulants 12/20/2010  . EPIDERMOID CYST 08/22/2010  . Chronic diastolic heart failure ([place]) 08/06/2010  . SYNCOPE AND COLLAPSE 07/27/2010  . OSTEOARTHRITIS, KNEE, RIGHT  03/15/2010  . Morbid obesity ([place]) 12/07/2009  . NONSPEC ELEVATION OF LEVELS OF TRANSAMINASE/LDH 09/08/2009  . BREAST CANCER, HX OF 07/25/2009  . COLONIC POLYPS, HX OF 07/25/2009  . TUBULOVILLOUS ADENOMA, COLON 04/29/2008  . HYPERGLYCEMIA, FASTING 04/29/2008  . HYPERLIPIDEMIA 06/22/2007  . CARCINOMA, THYROID GLAND, HX OF 06/22/2007    Alda Lea, PT 12/04/2018, 6:04 PM  [facility] [address] [place], [place], [address] Phone: [phone]   Fax:  [phone]  Name: ANIAH PAULI MRN: [id number] Date of Birth: [birth year]

## 2018-12-07 ENCOUNTER — Other Ambulatory Visit: Payer: Self-pay | Admitting: Internal Medicine

## 2018-12-08 ENCOUNTER — Ambulatory Visit: Payer: Medicare Other | Admitting: Physical Therapy

## 2018-12-08 ENCOUNTER — Telehealth: Payer: Self-pay

## 2018-12-08 ENCOUNTER — Telehealth: Payer: Self-pay | Admitting: Internal Medicine

## 2018-12-08 MED ORDER — APIXABAN 5 MG PO TABS: 5.0000 mg | ORAL_TABLET | Freq: Two times a day (BID) | ORAL | 2 refills | Status: DC

## 2018-12-08 MED ORDER — AMIODARONE HCL 200 MG PO TABS: 200.0000 mg | ORAL_TABLET | Freq: Every day | ORAL | 3 refills | Status: DC

## 2018-12-08 NOTE — Telephone Encounter (Signed)
New Message        *STAT* If patient is at the pharmacy, call can be transferred to refill team.   1. Which medications need to be refilled? (please list name of each medication and dose if known) Amiodarone 200 mg  2. Which pharmacy/location (including street and city if local pharmacy) is medication to be sent to? [facility]   3. Do they need a 30 day or 90 day supply? 90     Patient states when medication was called in that [facility].

## 2018-12-08 NOTE — Telephone Encounter (Signed)
Spoke with pt to assess potential needs. She states she has no needs from Dr Caryl Comes at this time other than refills for amiodarone and eliquis. Pt denies SOB, CP, or palpitations. She states she agrees to cancel her upcoming 3/23 OV with Dr Caryl Comes. She understands we will call her in the future to reschedule. She has no additional needs or concerns. She will call or mychart for future needs.

## 2018-12-10 ENCOUNTER — Ambulatory Visit: Payer: Medicare Other | Admitting: Rehabilitation

## 2018-12-11 ENCOUNTER — Inpatient Hospital Stay: Admission: RE | Admit: 2018-12-11 | Payer: Medicare Other | Source: Ambulatory Visit

## 2018-12-14 ENCOUNTER — Encounter: Payer: Medicare Other | Admitting: Internal Medicine

## 2018-12-15 ENCOUNTER — Ambulatory Visit: Payer: Medicare Other | Admitting: Physical Therapy

## 2018-12-16 ENCOUNTER — Telehealth: Payer: Self-pay

## 2018-12-16 NOTE — Telephone Encounter (Signed)
Called pt to see if she would be interested in evisit with Caryl Comes. Pt prefers to only use evisit if necessary, would prefer OV when available.

## 2018-12-17 ENCOUNTER — Encounter (HOSPITAL_COMMUNITY): Payer: Self-pay | Admitting: Emergency Medicine

## 2018-12-17 ENCOUNTER — Emergency Department (HOSPITAL_COMMUNITY): Payer: Medicare Other

## 2018-12-17 ENCOUNTER — Emergency Department (HOSPITAL_COMMUNITY)
Admission: EM | Admit: 2018-12-17 | Discharge: 2018-12-17 | Disposition: A | Discharge status: Home / Self Care | Payer: Medicare Other | Attending: Emergency Medicine | Admitting: Emergency Medicine

## 2018-12-17 DIAGNOSIS — R55 Syncope and collapse: Secondary | ICD-10-CM | POA: Insufficient documentation

## 2018-12-17 DIAGNOSIS — I5032 Chronic diastolic (congestive) heart failure: Secondary | ICD-10-CM | POA: Diagnosis not present

## 2018-12-17 DIAGNOSIS — Z7901 Long term (current) use of anticoagulants: Secondary | ICD-10-CM | POA: Diagnosis not present

## 2018-12-17 DIAGNOSIS — Z950 Presence of cardiac pacemaker: Secondary | ICD-10-CM | POA: Diagnosis not present

## 2018-12-17 DIAGNOSIS — Z85850 Personal history of malignant neoplasm of thyroid: Secondary | ICD-10-CM | POA: Insufficient documentation

## 2018-12-17 DIAGNOSIS — Z96611 Presence of right artificial shoulder joint: Secondary | ICD-10-CM | POA: Diagnosis not present

## 2018-12-17 DIAGNOSIS — E039 Hypothyroidism, unspecified: Secondary | ICD-10-CM | POA: Diagnosis not present

## 2018-12-17 DIAGNOSIS — Z96651 Presence of right artificial knee joint: Secondary | ICD-10-CM | POA: Diagnosis not present

## 2018-12-17 DIAGNOSIS — R05 Cough: Secondary | ICD-10-CM | POA: Insufficient documentation

## 2018-12-17 DIAGNOSIS — R0602 Shortness of breath: Secondary | ICD-10-CM | POA: Insufficient documentation

## 2018-12-17 DIAGNOSIS — R000 Tachycardia, unspecified: Secondary | ICD-10-CM | POA: Diagnosis not present

## 2018-12-17 DIAGNOSIS — Z79899 Other long term (current) drug therapy: Secondary | ICD-10-CM | POA: Insufficient documentation

## 2018-12-17 DIAGNOSIS — Z853 Personal history of malignant neoplasm of breast: Secondary | ICD-10-CM | POA: Diagnosis not present

## 2018-12-17 DIAGNOSIS — R0902 Hypoxemia: Secondary | ICD-10-CM | POA: Diagnosis not present

## 2018-12-17 DIAGNOSIS — Z85118 Personal history of other malignant neoplasm of bronchus and lung: Secondary | ICD-10-CM | POA: Insufficient documentation

## 2018-12-17 DIAGNOSIS — I4891 Unspecified atrial fibrillation: Secondary | ICD-10-CM | POA: Diagnosis not present

## 2018-12-17 DIAGNOSIS — I110 Hypertensive heart disease with heart failure: Secondary | ICD-10-CM | POA: Diagnosis not present

## 2018-12-17 DIAGNOSIS — R51 Headache: Secondary | ICD-10-CM | POA: Diagnosis not present

## 2018-12-17 DIAGNOSIS — Z8572 Personal history of non-Hodgkin lymphomas: Secondary | ICD-10-CM | POA: Insufficient documentation

## 2018-12-17 DIAGNOSIS — R42 Dizziness and giddiness: Secondary | ICD-10-CM | POA: Diagnosis not present

## 2018-12-17 LAB — TROPONIN I: Troponin I: <0.03 ng/mL (ref <0.03)

## 2018-12-17 LAB — BASIC METABOLIC PANEL
Anion gap: 10 (ref 5–15)
BUN: 22 mg/dL (ref 8–23)
CO2: 24 mmol/L (ref 22–32)
Calcium: 8.9 mg/dL (ref 8.9–10.3)
Chloride: 108 mmol/L (ref 98–111)
Creatinine, Ser: 1.17 mg/dL — ABNORMAL HIGH (ref 0.44–1.00)
GFR calc Af Amer: 53 mL/min — ABNORMAL LOW (ref >60)
GFR calc non Af Amer: 46 mL/min — ABNORMAL LOW (ref >60)
GLUCOSE: 111 mg/dL — AB (ref 70–99)
Potassium: 4.4 mmol/L (ref 3.5–5.1)
Sodium: 142 mmol/L (ref 135–145)

## 2018-12-17 LAB — CBC WITH DIFFERENTIAL/PLATELET
ABS IMMATURE GRANULOCYTES: 0.03 10*3/uL (ref 0.00–0.07)
BASOS PCT: 1 %
Basophils Absolute: 0 10*3/uL (ref 0.0–0.1)
EOS ABS: 0.2 10*3/uL (ref 0.0–0.5)
Eosinophils Relative: 3 %
HCT: 41.4 % (ref 36.0–46.0)
Hemoglobin: 12.6 g/dL (ref 12.0–15.0)
IMMATURE GRANULOCYTES: 1 %
Lymphocytes Relative: 11 %
Lymphs Abs: 0.6 10*3/uL — ABNORMAL LOW (ref 0.7–4.0)
MCH: 29.2 pg (ref 26.0–34.0)
MCHC: 30.4 g/dL (ref 30.0–36.0)
MCV: 96.1 fL (ref 80.0–100.0)
Monocytes Absolute: 0.7 10*3/uL (ref 0.1–1.0)
Monocytes Relative: 12 %
NEUTROS PCT: 72 %
Neutro Abs: 4.3 10*3/uL (ref 1.7–7.7)
Platelets: 155 10*3/uL (ref 150–400)
RBC: 4.31 MIL/uL (ref 3.87–5.11)
RDW: 15.4 % (ref 11.5–15.5)
WBC: 5.8 10*3/uL (ref 4.0–10.5)
nRBC: 0 % (ref 0.0–0.2)

## 2018-12-17 LAB — CBG MONITORING, ED: Glucose-Capillary: 81 mg/dL (ref 70–99)

## 2018-12-17 LAB — D-DIMER, QUANTITATIVE: D-Dimer, Quant: 0.44 ug/mL-FEU (ref 0.00–0.50)

## 2018-12-17 NOTE — ED Notes (Signed)
CBG was 81.

## 2018-12-17 NOTE — ED Provider Notes (Signed)
[facility] EMERGENCY DEPARTMENT Provider Note   CSN: [id number] Arrival date & time: 12/17/18  7412    History   Chief Complaint Chief Complaint  Patient presents with  . Shortness of Breath    HPI Kayla Harrison is a 73 y.o. female.     HPI  73 year old female presents with near syncope.  She was in the grocery store checkout line and all of a sudden started feeling lightheaded.  She states that she felt like she was going to pass out.  Lasted overall about 10 minutes.  Other people in the store noticed she was feeling poorly and put her in a chair.  She never did pass out.  She developed a mild to moderate headache afterwards which is improving.  However no thunderclap/severe headache.  No chest pain or pressure but feels funny in her chest.  She states she is been having this feeling on and off for a couple weeks and thought it might be her atrial flutter.  She felt short of breath at the end of the episode and this is also somewhat improving.  No leg swelling.  No recent illness such as vomiting or diarrhea.  She carries a chronic cough but no new cough or fever.  Past Medical History:  Diagnosis Date  . Anemia   . Arthritis    osteoarthritis - knees and right shoulder  . Atrial fibrillation [facility])    ablation- 2x's-- 1st time- [facility], 2nd event at [facility] in 2008. Convergent ablation at [facility] 6/14  . Atrial fibrillation ([place])   . Blood transfusion without reported diagnosis   . Breast cancer ([place])    Dr Margot Chimes, total thyroidectomy- 1999- for cancer  . 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 Chronic bilateral pleural effusions   . Colon polyp    Dr Earlean Shawl  . Complete heart block ([place])   . Complication of anesthesia    Ketamine produces LSD reaction, bright colored nightmarish experience   . Dyslipidemia   . Dyspnea   . Endometriosis   . Fibroids   . H/O pleural effusion    s/p thoracentesis w 3228m withdrawn  . Hepatitis    [place]   . History of dysphagia    due to radiation therapy  . History of hiatal hernia    small noted on PET scan  . History of kidney stones   . Hx of thyroid cancer    Dr SForde Dandy . Hyperlipidemia   . Hypertension   . Hypothyroidism   . Lung cancer, [place] lobe ([place] 01[place]   radiation RX completed 03/04/17; will start chemo 6/27, pt unaware of lung cancer  . Morbid obesity ([place]    Status post lap band surgery  . Nephrolithiasis   . Non Hodgkin's lymphoma ([place]    on chemotherapy  . Persistent atrial fibrillation    a. s/p PVI 2008 b. s/p convergent ablation [id number] by bradycardia requiring pacemaker implant  . Personal history of radiation therapy   . Presence of permanent cardiac pacemaker   . Rotator cuff tear    Right  . Sinus node dysfunction ([place]    Complicating convergent ablation 6/14  . [place]    [place]  . SVC syndrome    with lung mass and non hodgkins lymphoma  . Thyroid cancer ([facility] 2000    Patient Active Problem List   Diagnosis Date Noted  . Gait abnormality 07/10/2018  .  S/P reverse total shoulder arthroplasty, 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  . Persistent atrial fibrillation   . Bilateral ureteral calculi 11/17/2017  . Atrial fibrillation with RVR ([place]) 09/15/2017  . Atrial flutter ([place]) 07/17/2017  . Port catheter in place 04/02/2017  . Non-Hodgkin lymphoma, unspecified, intrathoracic lymph nodes ([place]) 02/13/2017  . Mediastinal mass 02/06/2017  . 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 Abnormal chest x-ray   . Lung mass 02/03/2017  . 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  . OSA (obstructive sleep apnea) 02/03/2015  . History of renal calculi 11/16/2014  . Renal calculi 11/16/2014  . Family history of colon cancer 10/26/2014  . Mechanical complication due to cardiac pacemaker pulse generator 11/06/2013  . Dyspnea 05/12/2013  . Hypothyroidism 04/21/2013  . Pleural effusion 04/19/2013  . Long term (current) use  of anticoagulants 12/20/2010  . EPIDERMOID CYST 08/22/2010  . Chronic diastolic heart failure ([place]) 08/06/2010  . SYNCOPE AND COLLAPSE 07/27/2010  . OSTEOARTHRITIS, KNEE, RIGHT 03/15/2010  . Morbid obesity ([place]) 12/07/2009  . NONSPEC ELEVATION OF LEVELS OF TRANSAMINASE/LDH 09/08/2009  . BREAST CANCER, HX OF 07/25/2009  . COLONIC POLYPS, HX OF 07/25/2009  . TUBULOVILLOUS ADENOMA, COLON 04/29/2008  . HYPERGLYCEMIA, FASTING 04/29/2008  . HYPERLIPIDEMIA 06/22/2007  . CARCINOMA, THYROID GLAND, HX OF 06/22/2007    Past Surgical History:  Procedure Laterality Date  . ABDOMINAL HYSTERECTOMY  1983  . afib ablation     a. 2008 PVI b. 2014 convergent ablation  . APPENDECTOMY    . BONE MARROW BIOPSY  02/21/2017  . BREAST LUMPECTOMY Left 2010  . bso  1998  . CARDIAC CATHETERIZATION     2015- negative  . CARDIOVERSION  10/09/2012   Procedure: CARDIOVERSION;  Surgeon: Minus Breeding, MD;  Location: [facility];  Service: Cardiovascular;  Laterality: N/A;  . CARDIOVERSION  10/09/2012   Procedure: CARDIOVERSION;  Surgeon: Minus Breeding, MD;  Location: [facility] ENDOSCOPY;  Service: Cardiovascular;  Laterality: N/A;  [name] gave the ok to add pt to the add on , but we must check to find out if the can add pt on at [phone] ( [phone])  . CARDIOVERSION N/A 11/20/2012   Procedure: CARDIOVERSION;  Surgeon: Fay Records, MD;  Location: [facility] ENDOSCOPY;  Service: Cardiovascular;  Laterality: N/A;  . CARDIOVERSION N/A 07/18/2017   Procedure: CARDIOVERSION;  Surgeon: Thayer Headings, MD;  Location: [facility] ENDOSCOPY;  Service: Cardiovascular;  Laterality: N/A;  . CARDIOVERSION N/A 10/03/2017   Procedure: CARDIOVERSION;  Surgeon: Sanda Klein, MD;  Location: [facility] ENDOSCOPY;  Service: Cardiovascular;  Laterality: N/A;  . CARDIOVERSION N/A 01/07/2018   Procedure: CARDIOVERSION;  Surgeon: Acie Fredrickson Wonda Cheng, MD;  Location: [facility];  Service: Cardiovascular;  Laterality: N/A;  . CHOLECYSTECTOMY    . COLONOSCOPY W/ POLYPECTOMY     Dr  Earlean Shawl  . CYSTOSCOPY N/A 02/06/2015   Procedure: CYSTOSCOPY;  Surgeon: Kathie Rhodes, MD;  [facility];  Service: Urology;  Laterality: N/A;  . CYSTOSCOPY W/ RETROGRADES Left 11/17/2017   Procedure: CYSTOSCOPY WITH RETROGRADE /PYELOGRAM/;  Surgeon: Kathie Rhodes, MD;  Location: [facility];  Service: Urology;  Laterality: Left;  . CYSTOSCOPY WITH RETROGRADE PYELOGRAM, URETEROSCOPY AND STENT PLACEMENT Right 02/06/2015   Procedure: RETROGRADE PYELOGRAM, RIGHT URETEROSCOPY STENT PLACEMENT;  Surgeon: Kathie Rhodes, MD;  Location: [facility];  Service: Urology;  Laterality: Right;  . CYSTOSCOPY WITH RETROGRADE PYELOGRAM, URETEROSCOPY AND STENT PLACEMENT Right 03/07/2017   Procedure: CYSTOSCOPY WITH RIGHT RETROGRADE PYELOGRAM,RIGHT  URETEROSCOPYLASER LITHOTRIPSY  AND STENT PLACEMENT AND STONE BASKETRY;  Surgeon: Kathie Rhodes,  MD;  Location: [facility];  Service: Urology;  Laterality: Right;  . EYE SURGERY     cataract surgery  . fatty mass removal  1999   pubic area  . HOLMIUM LASER APPLICATION N/A 05/16/2352   Procedure: HOLMIUM LASER APPLICATION;  Surgeon: Kathie Rhodes, MD;  [facility];  Service: Urology;  Laterality: N/A;  . HOLMIUM LASER APPLICATION Right 03/06/4314   Procedure: HOLMIUM LASER APPLICATION;  Surgeon: Kathie Rhodes, MD;  Location: [facility];  Service: Urology;  Laterality: Right;  . HOLMIUM LASER APPLICATION Left 4/00/8676   Procedure: HOLMIUM LASER APPLICATION;  Surgeon: Kathie Rhodes, MD;  [facility];  Service: Urology;  Laterality: Left;  . IR FLUORO GUIDE PORT INSERTION RIGHT  02/24/2017  . IR NEPHROSTOMY PLACEMENT RIGHT  11/17/2017  . IR PATIENT EVAL TECH 0-60 MINS  03/11/2017  . IR REMOVAL TUN ACCESS W/ PORT W/O FL MOD SED  04/20/2018  . IR US GUIDE VASC ACCESS RIGHT  02/24/2017  . KNEE ARTHROSCOPY     bilateral  . LAPAROSCOPIC GASTRIC BANDING  07/10/2010  . LAPAROSCOPIC GASTRIC BANDING     Laparoscopic adjustable banding APS System with  posterior hiatal hernia, 2 suture.  Marland Kitchen LAPAROTOMY     for ruptured ovary and ovarian artery   . NEPHROLITHOTOMY Right 11/17/2017   Procedure: NEPHROLITHOTOMY PERCUTANEOUS;  Surgeon: Kathie Rhodes, MD;  [facility];  Service: Urology;  Laterality: Right;  . PACEMAKER INSERTION  03/10/2013   MDT dual chamber PPM  . POCKET REVISION N/A 12/08/2013   Procedure: POCKET REVISION;  Surgeon: Deboraha Sprang, MD;  Location: [facility] CATH LAB;  Service: Cardiovascular;  Laterality: N/A;  . PORTA CATH INSERTION    . REVERSE SHOULDER ARTHROPLASTY Right 05/14/2018   Procedure: RIGHT REVERSE SHOULDER ARTHROPLASTY;  Surgeon: Tania Ade, MD;  Location: [facility];  Service: Orthopedics;  Laterality: Right;  . REVERSE SHOULDER REPLACEMENT Right 05/14/2018  . TEE WITH CARDIOVERSION  09/22/2017  . TEE WITHOUT CARDIOVERSION N/A 10/03/2017   Procedure: TRANSESOPHAGEAL ECHOCARDIOGRAM (TEE);  Surgeon: Sanda Klein, MD;  Location: [facility];  Service: Cardiovascular;  Laterality: N/A;  . THYROIDECTOMY  1998   Dr Margot Chimes  . TONSILLECTOMY    . TOTAL KNEE ARTHROPLASTY  04/13/2012   Procedure: TOTAL KNEE ARTHROPLASTY;  Surgeon: Rudean Haskell, MD;  Location: [facility];  Service: Orthopedics;  Laterality: Right;  Marland Kitchen VIDEO BRONCHOSCOPY WITH ENDOBRONCHIAL ULTRASOUND N/A 02/07/2017   Procedure: VIDEO BRONCHOSCOPY WITH ENDOBRONCHIAL ULTRASOUND;  Surgeon: Marshell Garfinkel, MD;  Location: [place];  Service: Pulmonary;  Laterality: N/A;     OB History   No obstetric history on file.      Home Medications    Prior to Admission medications   Medication Sig Start Date End Date Taking? Authorizing Provider  acetaminophen (TYLENOL) 500 MG tablet Take 1,000 mg by mouth at bedtime.     [provider]  amiodarone (PACERONE) 200 MG tablet Take 1 tablet (200 mg total) by mouth daily. 12/08/18   Deboraha Sprang, MD  apixaban (ELIQUIS) 5 MG TABS tablet Take 1 tablet (5 mg total) by mouth 2 (two) times daily. 12/08/18   Deboraha Sprang, MD  levothyroxine (SYNTHROID, LEVOTHROID) 175 MCG tablet Take 175 mcg by mouth See admin instructions. TAKE 1 TABLET (175 MCG) BY MOUTH DAILY, EXCEPT FRIDAYS.    [provider]  Lifitegrast Shirley Friar) 5 % SOLN Apply 1 drop to eye at bedtime.     [provider]  LORazepam (ATIVAN) 1 MG tablet Take 1 tablet (1 mg total) by mouth 3 (three) times daily as needed for anxiety. 06/27/18   Mesner, Corene Cornea, MD  meclizine (ANTIVERT) 25 MG tablet Take 1 tablet (25 mg total) by mouth 3 (three) times daily as needed for dizziness. 06/27/18   Mesner, Corene Cornea, MD  Multiple Vitamin (MULTIVITAMIN) tablet Take 1 tablet by mouth daily.     [provider]  rosuvastatin (CRESTOR) 10 MG tablet Take 10 mg by mouth every Wednesday. In the morning    [provider]  traMADol (ULTRAM) 50 MG tablet Take 50 mg by mouth 2 (two) times daily as needed (for moderate shoulder pain.).    [provider]    Family History Family History  Problem Relation Age of Onset  . Heart disease Father 36       MI '@autopsy'$   . Colon cancer Father        COLON  . Heart attack Father   . Other Mother        temporal arteritis   . Diabetes Sister   . Diabetes Brother   . Diabetes Paternal Aunt   . Diabetes Paternal Grandmother     Social History Social History   Tobacco Use  . Smoking status: Never Smoker  . Smokeless tobacco: Never Used  Substance Use Topics  . Alcohol use: No    Comment: none since 1990  . Drug use: Never     Allergies   Dofetilide; Rivaroxaban; Epinephrine; Adhesive [tape]; and Ketamine   Review of Systems Review of Systems  Constitutional: Negative for fever.  Respiratory: Positive for shortness of breath.   Cardiovascular: Negative for chest pain and leg swelling.  Gastrointestinal: Negative for vomiting.  Neurological: Positive for light-headedness and headaches. Negative for syncope and weakness.  All other systems reviewed and are negative.     Physical Exam Updated Vital Signs BP (!) 150/86   Pulse 74   Temp 98 F (36.7 C) (Oral)   Resp 20   SpO2 97%   Physical Exam Vitals signs and nursing note reviewed.  Constitutional:      General: She is not in acute distress.    Appearance: She is well-developed. She is not ill-appearing or diaphoretic.  HENT:     Head: Normocephalic and atraumatic.     Right Ear: External ear normal.     Left Ear: External ear normal.     Nose: Nose normal.  Eyes:     General:        Right eye: No discharge.        Left eye: No discharge.  Cardiovascular:     Rate and Rhythm: Normal rate and regular rhythm.     Heart sounds: Normal heart sounds.  Pulmonary:     Effort: Pulmonary effort is normal.     Breath sounds: Normal breath sounds.  Abdominal:     Palpations: Abdomen is soft.     Tenderness: There is no abdominal tenderness.  Musculoskeletal:     Right lower leg: No edema.     Left lower leg: No edema.  Skin:    General: Skin is warm and dry.  Neurological:     Mental Status: She is alert.     Comments: CN 3-12 grossly intact. 5/5 strength in all 4 extremities. Grossly normal sensation. Normal finger to nose.   Psychiatric:        Mood and Affect: Mood is not anxious.      ED  Treatments / Results  Labs (all labs ordered are listed, but only abnormal results are displayed) Labs Reviewed  BASIC METABOLIC PANEL - Abnormal; Notable for the following components:      Result Value   Glucose, Bld 111 (*)    Creatinine, Ser 1.17 (*)    GFR calc non Af Amer 46 (*)    GFR calc Af Amer 53 (*)    All other components within normal limits  CBC WITH DIFFERENTIAL/PLATELET - Abnormal; Notable for the following components:   Lymphs Abs 0.6 (*)    All other components within normal limits  TROPONIN I  D-DIMER, QUANTITATIVE (NOT AT [facility])  CBG MONITORING, ED    EKG EKG Interpretation  Date/Time:  Thursday December 17 2018 09:29:30 EDT Ventricular Rate:  96 PR Interval:     QRS Duration: 195 QT Interval:  496 QTC Calculation: 627 R Axis:   -88 Text Interpretation:  Sinus rhythm Short PR interval Consider right atrial enlargement Nonspecific IVCD with LAD Left ventricular hypertrophy IVCD is new since Aug 2019 Confirmed by Sherwood Gambler [phone]) on 12/17/2018 9:40:03 AM   Radiology Dg Chest Port 1 View  Result Date: 12/17/2018 CLINICAL DATA:  Shortness of breath and dizziness EXAM: PORTABLE CHEST 1 VIEW COMPARISON:  Chest radiograph February 24, 2018 and chest CT August 10, 2018 FINDINGS: There is scarring in the right perihilar region. There is no edema or consolidation. Heart is upper normal in size with pulmonary vascularity normal. There is a left atrial appendage clamp present. Pacemaker leads are attached to the right atrium and right ventricle. Port-A-Cath is no longer evident. There is now a total shoulder replacement on the right. Surgical clips are noted in the region of the thyroid. IMPRESSION: Essentially stable right perihilar region scarring. No appreciable edema or consolidation. Stable cardiac silhouette. Areas of postoperative change noted. Electronically Signed   By: Lowella Grip III M.D.   On: 12/17/2018 10:13    Procedures Procedures (including critical care time)  Medications Ordered in ED Medications - No data to display   Initial Impression / Assessment and Plan / ED Course  I have reviewed the triage vital signs and the nursing notes.  Pertinent labs & imaging results that were available during my care of the patient were reviewed by me and considered in my medical decision making (see chart for details).        Patient's near syncope is of unclear origin.  Her pacemaker was interrogated and while discussing with [employer] representative, she has had no acute events within the last 10 days, including this morning.  She is noted to have some nonspecific intraventricular conduction delay but given no arrhythmias this does not seem to  be pathologic at this time.  Some hypertension but otherwise vitals are benign.  Has a mild headache but given no sudden/thunderclap headache I doubt subarachnoid hemorrhage.  Given the shortness of breath associated with it, troponin and d-dimer were sent and both are negative.  I discussed that while she does not have chest pain, she would need second troponin to rule out MI.  However she feels fairly confident this is not heart attack related and does not want to wait on a second troponin, which I think is reasonable.  We discussed strict return precautions but I think she is stable for discharge home to follow-up with her PCP.  Final Clinical Impressions(s) / ED Diagnoses   Final diagnoses:  Near syncope    ED Discharge Orders  None       Sherwood Gambler, MD 12/17/18 1216

## 2018-12-17 NOTE — Discharge Instructions (Signed)
If you develop recurrent dizziness/lightheadedness or pass out, chest pain, trouble breathing, severe headache, or any other new/concerning symptoms, then return to the ER or call 911 for evaluation.

## 2018-12-17 NOTE — ED Triage Notes (Signed)
PT standing at register at grocery store and felt light headed and short of breath. Hx of afib, has pacemaker. Pt still currently "does not feel right"

## 2018-12-23 ENCOUNTER — Other Ambulatory Visit: Payer: Self-pay | Admitting: Internal Medicine

## 2018-12-24 MED ORDER — AMIODARONE HCL 200 MG PO TABS: 200.0000 mg | ORAL_TABLET | Freq: Every day | ORAL | 3 refills | Status: DC

## 2018-12-24 NOTE — Telephone Encounter (Addendum)
Pt's medication was resent to pt's pharmacy as requested, last Rx was on print and was not sent. Confirmation received.

## 2018-12-24 NOTE — Addendum Note (Signed)
Addended by: Derl Barrow on: 12/24/2018 04:05 PM   Modules accepted: Orders

## 2018-12-28 IMAGING — CT CT CHEST W/ CM
2 of 4 series · 15 of 36 positions shown, 18 images · IV contrast (ISOVUE 300)
Comparison: PET of 02/19/2017.  Chest CT 02/03/2017.

CLINICAL DATA: Mediastinal/thymic non-Hodgkin's lymphoma, large
B-cell diagnosed [DATE] with radiation therapy completed. Ongoing
chemotherapy. Cough for 1 year. Thyroid cancer 18 years ago. Left
breast cancer 7 years ago with lumpectomy and radiation therapy.

EXAM:
CT CHEST WITH CONTRAST
TECHNIQUE: Multidetector CT imaging of the chest was performed during
intravenous contrast administration.
CONTRAST:  75mL 5L6KGE-S88 IOPAMIDOL (5L6KGE-S88) INJECTION 61%

[Series 2: axial st · axial · 0.83mm/px · z∈[-266,+18]mm · 12 of 164 slices shown, 15 images]
[im 11/164  mediastinal]
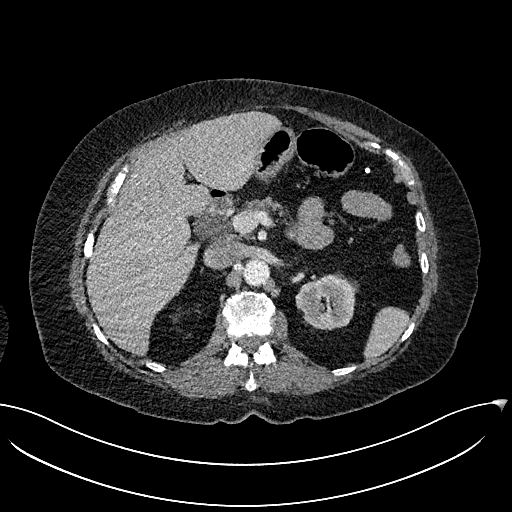
[im 11/164  lung]
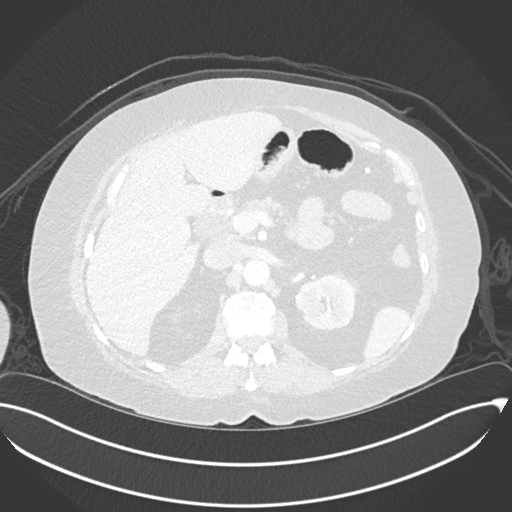
[im 22/164  lung]
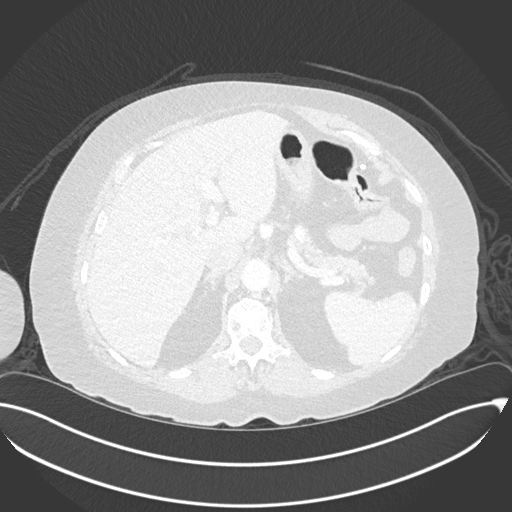
[im 33/164  lung]
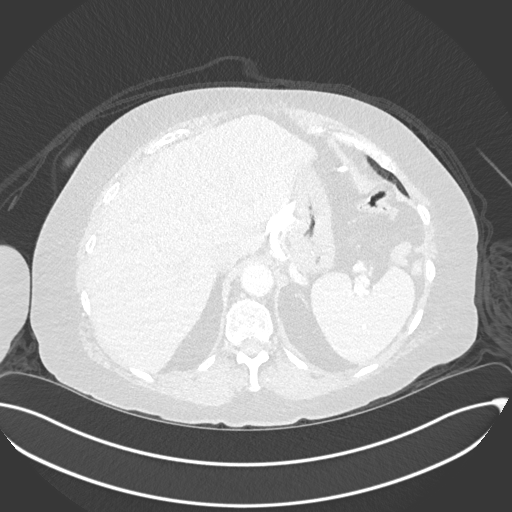
[im 55/164  lung]
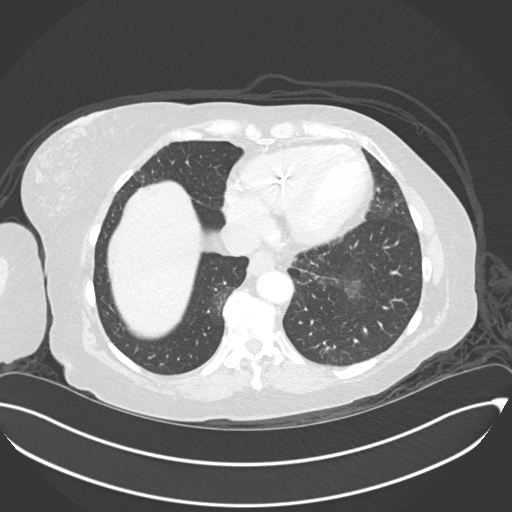
[im 66/164  mediastinal]
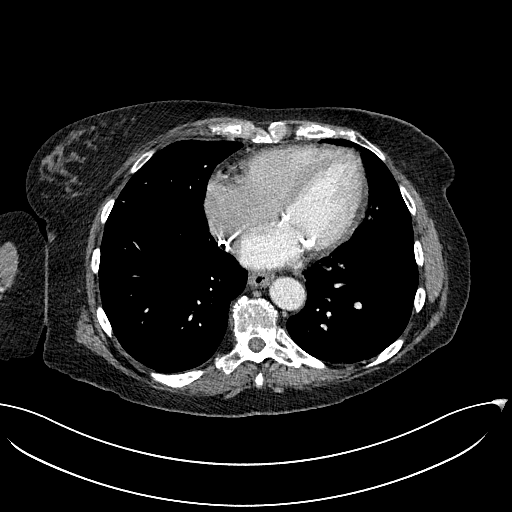
[im 66/164  lung]
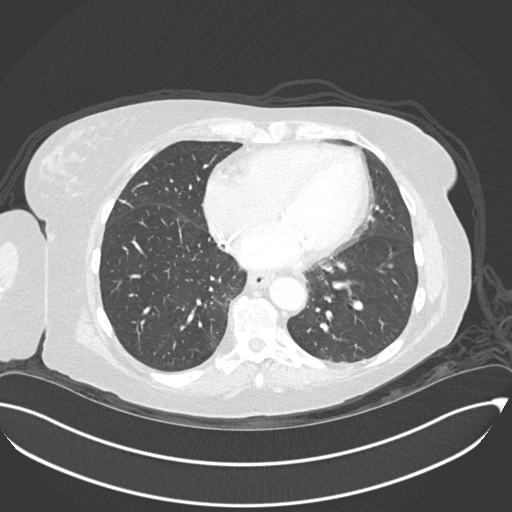
[im 77/164  lung]
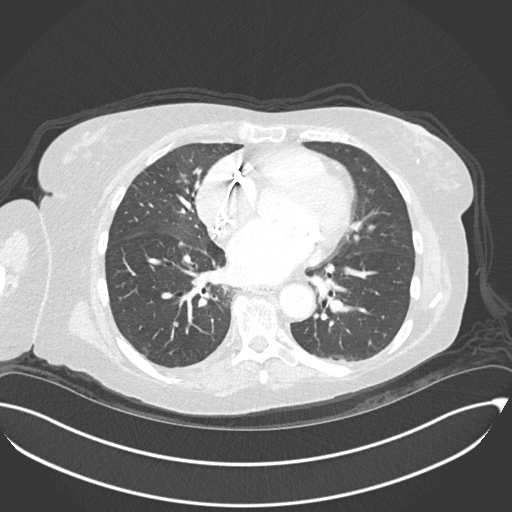
[im 87/164  lung]
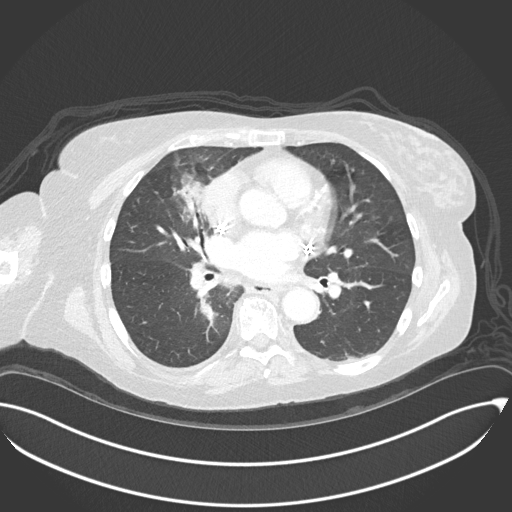
[im 98/164  lung]
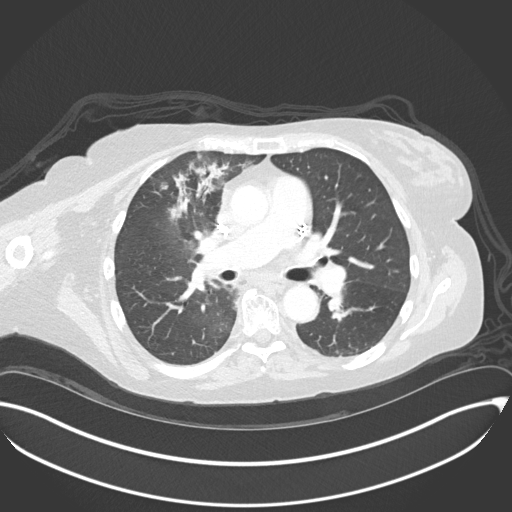
[im 109/164  mediastinal]
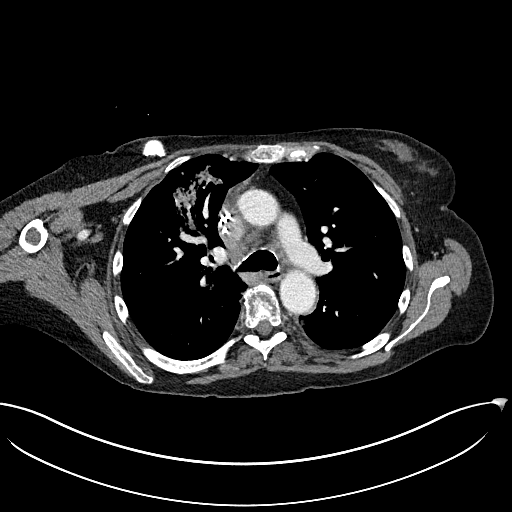
[im 109/164  lung]
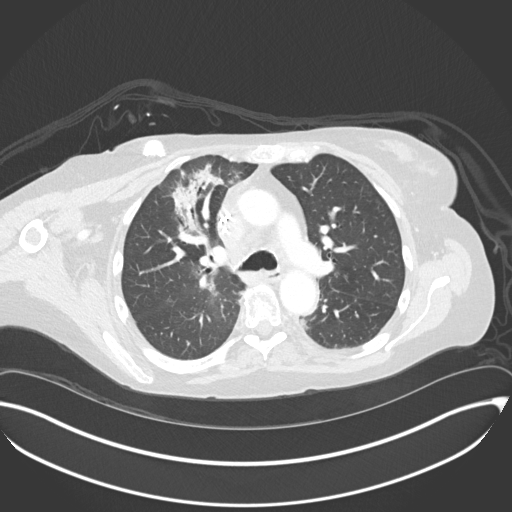
[im 131/164  lung]
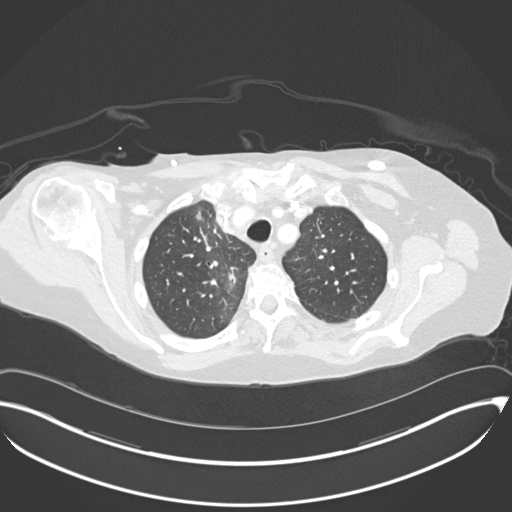
[im 142/164  lung]
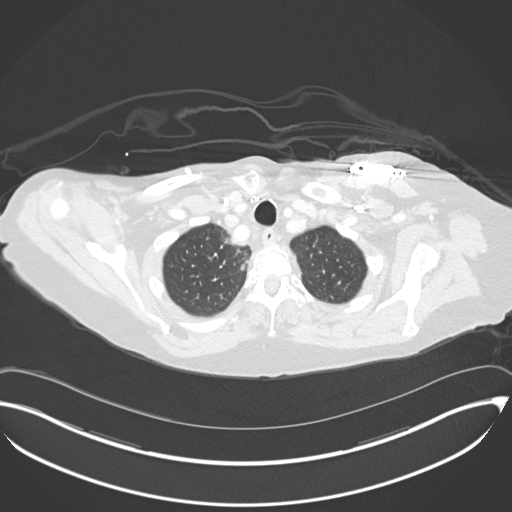
[im 153/164  lung]
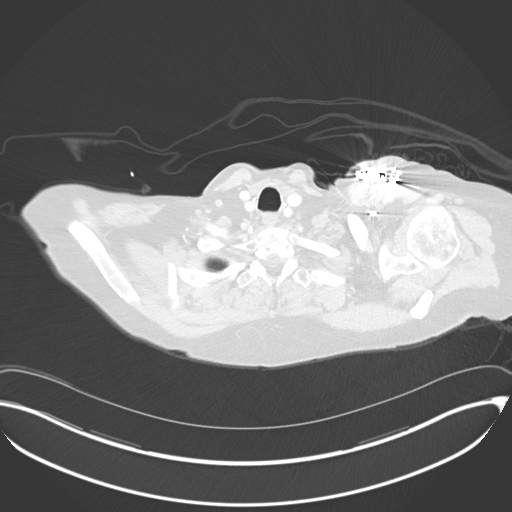

[Series 6: coronal · coronal · 0.68mm/px · 3 of 134 slices shown]
[im 27/134  lung]
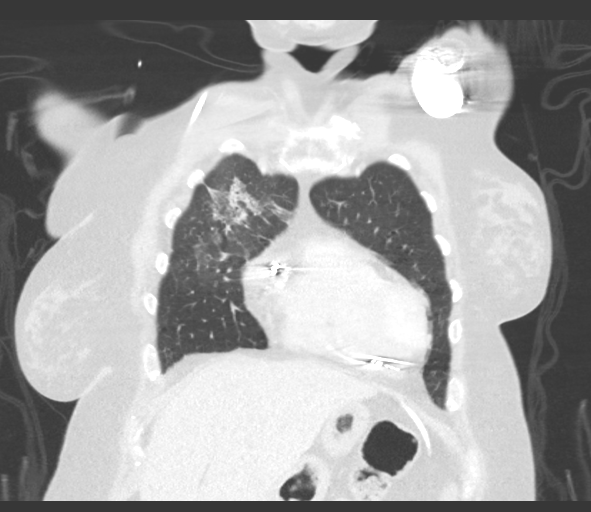
[im 54/134  lung]
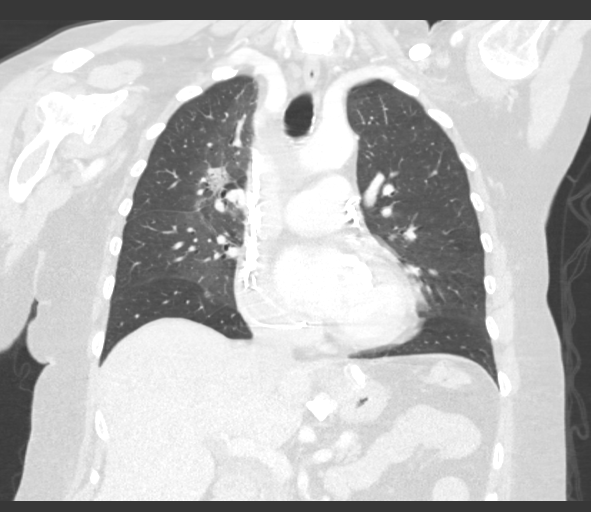
[im 80/134  lung]
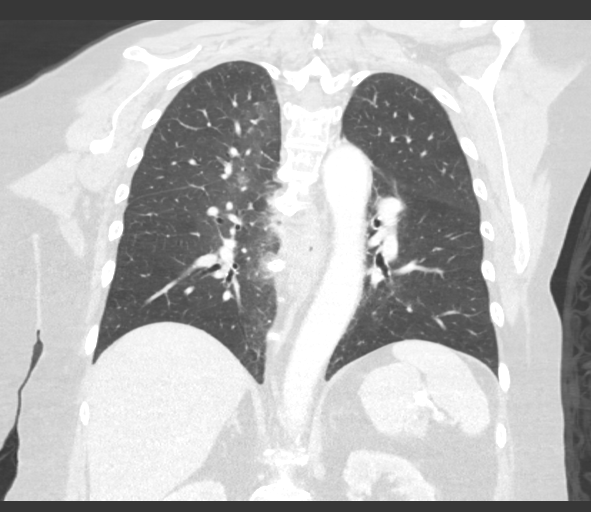

[15 of 36 positions shown; findings below may reference images not displayed]

FINDINGS: Cardiovascular: right-sided Port-A-Cath which terminates at the mid
right atrium. Dual lead pacer. Tortuous thoracic aorta. Aortic
atherosclerosis. Normal heart size, without pericardial effusion.
Multivessel coronary artery atherosclerosis. No central pulmonary
embolism, on this non-dedicated study.

Mediastinum/Nodes: No supraclavicular adenopathy. Status post
thyroidectomy. No axillary adenopathy. Significant improvement in
right paratracheal adenopathy. Residual nodal tissue measures
maximally 2.2 x 2.5 cm on image 54/series 2. Compare 7.1 x 5.4 cm on
the prior. No hilar adenopathy. Tiny hiatal hernia.

Lungs/Pleura: No pleural fluid. Patchy medial right upper and less
so right middle/right lower lobe airspace and ground-glass opacities
which are likely radiation induced. These are new. Minimal medial
superior segment left lower lobe airspace disease is also likely
radiation induced.

A 6 mm right upper lobe pulmonary nodule is similar on image
36/series 5.

Left lower lobe 6 mm nodule is unchanged on image 90/series 5.

Upper Abdomen: Left hepatic lobe cyst. Status post lap band
procedure. Old granulomatous disease in the spleen. Normal imaged
portions of the pancreas, adrenal glands, right kidney. Upper pole
left renal collecting system stone. Cholecystectomy.

Musculoskeletal: No acute osseous abnormality.
IMPRESSION: 1. Significant response to therapy of right paratracheal
adenopathy/mass.
2. Right greater than left ground-glass and airspace opacities are
likely radiation induced.
3. Coronary artery atherosclerosis. Aortic Atherosclerosis
([id number]).
4.  Tiny hiatal hernia.
5. Similar nonspecific pulmonary nodules.

## 2018-12-30 ENCOUNTER — Telehealth: Payer: Self-pay | Admitting: Internal Medicine

## 2018-12-30 NOTE — Telephone Encounter (Signed)
Spoke with pt regarding her transmission. She states she does not "feel the flutter" and has no symptoms. However, she is hypotensive. I offered her a virtual visit with the AF clinic. She declined and wants to wait to see Dr Caryl Comes on Monday. She believes her symptoms are secondary to medication vs her atrial fib/flutter. She agrees to call the office back with any changes. I advised her to stay hydrated and be careful when ambulating. She verbalized understanding and has no additional needs.

## 2018-12-30 NOTE — Telephone Encounter (Signed)
Transmission reviewed. Presenting rhythm shows A-flutter/Vs at ~120bpm. 42.1% AT/AF burden with V rate histograms right-shifted since last transmission on 12/17/18.

## 2018-12-30 NOTE — Telephone Encounter (Signed)
Noted  

## 2018-12-30 NOTE — Telephone Encounter (Signed)
Called pt back regarding her syncope episodes. Pt reports this has been ongoing for about 2 weeks.   2 weeks ago, pt was sent to ER after a near syncopal episode at the grocery store. ER MD reported normal EKG per pt. Pt was released from ER with no issues found.   Yesterday pt had another dizzy spell where she states she "feel and twisted her ankle and hit the floor", these spells have been ongoing but she typically feels when they are coming and can sit down before falling. She has been experiencing headaches more frequently. When asking pt about adequate fluid intake, she reports drinking plenty of water and no new diet changes.   Pt states she has been experiencing SOB on exertion and at rest. Denies chest pain and denies any new med changes.   Today's vitals: BP 93/69 HR 121   Pt suspects she is having a reaction to Eliquis or Amiodarone. Pt states her issues have been worsening over the past week.   Will route to [place] for advisement.

## 2018-12-30 NOTE — Telephone Encounter (Signed)
Pt calling today to discuss recent episodes of dizziness, unsteadiness, and near syncope. She states her SBP's have been in the 90's with HR between 110-119 today. She states she is eating and drinking regularly, urinating frequently. She has agreed to have a virtual visit with Dr Caryl Comes on Monday 4/13. She believes some of her symptoms could be side effects from Amiodarone or Eliquis. Pt denies taking ativan or tramadol which could drop her BP. She is compliant with all other medications.   In the meantime, she will send in a remote transmission to determine her recent HR's and rhythm's. She understands we will call her back with results.

## 2018-12-30 NOTE — Telephone Encounter (Signed)
New Message          Patient is calling in today because she is having passing out spells and some SOB, Pls call to advise.

## 2019-01-02 ENCOUNTER — Other Ambulatory Visit: Payer: Self-pay

## 2019-01-02 ENCOUNTER — Emergency Department (HOSPITAL_COMMUNITY): Payer: Medicare Other

## 2019-01-02 ENCOUNTER — Encounter (HOSPITAL_COMMUNITY): Payer: Self-pay | Admitting: Emergency Medicine

## 2019-01-02 ENCOUNTER — Emergency Department (HOSPITAL_COMMUNITY)
Admission: EM | Admit: 2019-01-02 | Discharge: 2019-01-02 | Disposition: A | Discharge status: Home / Self Care | Payer: Medicare Other | Attending: Emergency Medicine | Admitting: Emergency Medicine

## 2019-01-02 DIAGNOSIS — I10 Essential (primary) hypertension: Secondary | ICD-10-CM | POA: Diagnosis not present

## 2019-01-02 DIAGNOSIS — I471 Supraventricular tachycardia: Secondary | ICD-10-CM | POA: Insufficient documentation

## 2019-01-02 DIAGNOSIS — S39012A Strain of muscle, fascia and tendon of lower back, initial encounter: Secondary | ICD-10-CM | POA: Diagnosis not present

## 2019-01-02 DIAGNOSIS — E039 Hypothyroidism, unspecified: Secondary | ICD-10-CM | POA: Diagnosis not present

## 2019-01-02 DIAGNOSIS — S0990XA Unspecified injury of head, initial encounter: Secondary | ICD-10-CM | POA: Diagnosis not present

## 2019-01-02 DIAGNOSIS — W19XXXA Unspecified fall, initial encounter: Secondary | ICD-10-CM | POA: Diagnosis not present

## 2019-01-02 DIAGNOSIS — Y939 Activity, unspecified: Secondary | ICD-10-CM | POA: Insufficient documentation

## 2019-01-02 DIAGNOSIS — Z79899 Other long term (current) drug therapy: Secondary | ICD-10-CM | POA: Insufficient documentation

## 2019-01-02 DIAGNOSIS — Y929 Unspecified place or not applicable: Secondary | ICD-10-CM | POA: Diagnosis not present

## 2019-01-02 DIAGNOSIS — Y999 Unspecified external cause status: Secondary | ICD-10-CM | POA: Insufficient documentation

## 2019-01-02 DIAGNOSIS — R000 Tachycardia, unspecified: Secondary | ICD-10-CM | POA: Diagnosis not present

## 2019-01-02 DIAGNOSIS — R55 Syncope and collapse: Secondary | ICD-10-CM | POA: Insufficient documentation

## 2019-01-02 DIAGNOSIS — Z96651 Presence of right artificial knee joint: Secondary | ICD-10-CM | POA: Insufficient documentation

## 2019-01-02 DIAGNOSIS — M545 Low back pain: Secondary | ICD-10-CM | POA: Diagnosis not present

## 2019-01-02 LAB — CBC WITH DIFFERENTIAL/PLATELET
Abs Immature Granulocytes: 0.02 10*3/uL (ref 0.00–0.07)
Basophils Absolute: 0.1 10*3/uL (ref 0.0–0.1)
Basophils Relative: 1 %
Eosinophils Absolute: 0.2 10*3/uL (ref 0.0–0.5)
Eosinophils Relative: 3 %
HCT: 43.4 % (ref 36.0–46.0)
Hemoglobin: 13.2 g/dL (ref 12.0–15.0)
Immature Granulocytes: 0 %
Lymphocytes Relative: 12 %
Lymphs Abs: 0.7 10*3/uL (ref 0.7–4.0)
MCH: 29.1 pg (ref 26.0–34.0)
MCHC: 30.4 g/dL (ref 30.0–36.0)
MCV: 95.8 fL (ref 80.0–100.0)
Monocytes Absolute: 0.7 10*3/uL (ref 0.1–1.0)
Monocytes Relative: 12 %
Neutro Abs: 4.1 10*3/uL (ref 1.7–7.7)
Neutrophils Relative %: 72 %
Platelets: 162 10*3/uL (ref 150–400)
RBC: 4.53 MIL/uL (ref 3.87–5.11)
RDW: 15.5 % (ref 11.5–15.5)
WBC: 5.8 10*3/uL (ref 4.0–10.5)
nRBC: 0 % (ref 0.0–0.2)

## 2019-01-02 LAB — COMPREHENSIVE METABOLIC PANEL
ALT: 18 U/L (ref 0–44)
AST: 15 U/L (ref 15–41)
Albumin: 3.4 g/dL — ABNORMAL LOW (ref 3.5–5.0)
Alkaline Phosphatase: 73 U/L (ref 38–126)
Anion gap: 10 (ref 5–15)
BUN: 16 mg/dL (ref 8–23)
CO2: 23 mmol/L (ref 22–32)
Calcium: 9.1 mg/dL (ref 8.9–10.3)
Chloride: 108 mmol/L (ref 98–111)
Creatinine, Ser: 1 mg/dL (ref 0.44–1.00)
GFR calc Af Amer: >60 mL/min (ref >60)
GFR calc non Af Amer: 55 mL/min — ABNORMAL LOW (ref >60)
Glucose, Bld: 96 mg/dL (ref 70–99)
Potassium: 4.5 mmol/L (ref 3.5–5.1)
Sodium: 141 mmol/L (ref 135–145)
Total Bilirubin: 0.9 mg/dL (ref 0.3–1.2)
Total Protein: 6.1 g/dL — ABNORMAL LOW (ref 6.5–8.1)

## 2019-01-02 MED ORDER — LIDOCAINE 5 % EX PTCH: 1.0000 | MEDICATED_PATCH | CUTANEOUS | 0 refills | Status: DC

## 2019-01-02 MED ORDER — DILTIAZEM HCL 25 MG/5ML IV SOLN
10.0000 mg | Freq: Once | INTRAVENOUS | Status: AC
  Administered 2019-01-02: 10 mg via INTRAVENOUS
  Filled 2019-01-02: qty 5

## 2019-01-02 MED ORDER — SODIUM CHLORIDE 0.9 % IV BOLUS
1000.0000 mL | Freq: Once | INTRAVENOUS | Status: AC
  Administered 2019-01-02: 1000 mL via INTRAVENOUS

## 2019-01-02 MED ORDER — PROPOFOL 10 MG/ML IV BOLUS
1.0000 mg/kg | Freq: Once | INTRAVENOUS | Status: DC
  Filled 2019-01-02: qty 20

## 2019-01-02 MED ORDER — PROPOFOL 10 MG/ML IV BOLUS
INTRAVENOUS | Status: AC | PRN
  Administered 2019-01-02: 50 mg via INTRAVENOUS

## 2019-01-02 MED ORDER — TRAMADOL HCL 50 MG PO TABS: 50.0000 mg | ORAL_TABLET | Freq: Four times a day (QID) | ORAL | 0 refills | Status: DC | PRN

## 2019-01-02 NOTE — ED Triage Notes (Addendum)
Pt in with c/o L low back pain after recent fall on Tuesday. Pt has had 2 syncopal events in last 2 wks. Hx of afib, states she is in flutter today. Denies cp, sob or fevers. Pain at worst, 9/10. Hx of multiple ablations and cardioversions, has [employer] pacemaker, takes Eliquis

## 2019-01-02 NOTE — Discharge Instructions (Signed)
° °  Get help right away if: Your back pain is severe. You cannot stand or walk. You have difficulty controlling when you urinate or when you have a bowel movement. You feel nauseous or you vomit. Your feet get very cold. You have numbness, tingling, weakness, or problems using your arms or legs. You develop any of the following: Shortness of breath. Dizziness. Pain in your legs. Weakness in your buttocks or legs. Discoloration of the skin on your toes or legs.

## 2019-01-02 NOTE — ED Notes (Addendum)
Patient verbalizes understanding of discharge instructions. Opportunity for questioning and answers were provided. Armband removed by staff, pt discharged from ED. Wheeled out to lobby, family contacted for transport home

## 2019-01-02 NOTE — ED Notes (Signed)
Patient transported to X-ray 

## 2019-01-02 NOTE — ED Provider Notes (Addendum)
[facility] EMERGENCY DEPARTMENT Provider Note   CSN: [id number] Arrival date & time: 01/02/19  2979    History   Chief Complaint Chief Complaint  Patient presents with   Palpitations   syncope   Back Pain    HPI Kayla Harrison is a 73 y.o. female who presents for evaluation of back pain.  She has a history of atrial fibrillation and is status post pacemaker insertion.  She is chronically anticoagulated on Eliquis.  The patient had a syncopal even that occurred on Tuesday 12/29/2018. The patient states that she was standing at her dresser putting clothes away when she suddenly became presyncopal and then lost consciousness and fell to the floor . She states that as she felt herself getting ready to pass on that she was going down she twisted to try to grab the edge of her dresser before she hit the ground.  She states that she had no warning.  The patient later felt her heart racing and contacted her cardiologist who was able to tell her that she was in atrial flutter per her [employer] pacemaker reading.  She states that she has not felt her heart racing but has noticed since that time that her heart rate has been between 110 and 120 since that time.  She complains that since that time she has had severe pain in her lower back.  She has been unable to pinpoint the location but points to the left lower region of her back.  She states that it hurts worse to move or lift her legs.  She denies any radiating pain, numbness, saddle anesthesia, loss of control of her bowel or bladder.  She has been able to ambulate with great difficulty.  The patient did hit the side of her face and states that she has been having headaches but denies changes in vision, pain with eye movement, unilateral weakness paresthesia difficulty with speech or swallowing.  Patient denies melena or hematochezia.  She thinks that her syncopal event was secondary to amiodarone toxicity.    HPI  Past  Medical History:  Diagnosis Date   Anemia    Arthritis    osteoarthritis - knees and right shoulder   Atrial fibrillation ([place])    ablation- 2x's-- 1st time- [facility], 2nd event at [facility] in 2008. Convergent ablation at [facility] 6/14   Atrial fibrillation ([place])    Blood transfusion without reported diagnosis    Breast cancer ([place])    Dr Margot Chimes, total thyroidectomy- 1999- for cancer   Brucellosis 1964   Chronic bilateral pleural effusions    Colon polyp    Dr Earlean Shawl   Complete heart block ([place])    Complication of anesthesia    Ketamine produces LSD reaction, bright colored nightmarish experience    Dyslipidemia    Dyspnea    Endometriosis    Fibroids    H/O pleural effusion    s/p thoracentesis w 3267m withdrawn   Hepatitis    [place] ?hepatitis    History of dysphagia    due to radiation therapy   History of hiatal hernia    small noted on PET scan   History of kidney stones    Hx of thyroid cancer    Dr SForde Dandy  Hyperlipidemia    Hypertension    Hypothyroidism    Lung cancer, lower lobe ([place] 01/2017   radiation RX completed 03/04/17; will start chemo 6/27, pt unaware of  lung cancer   Morbid obesity ([place])    Status post lap band surgery   Nephrolithiasis    Non Hodgkin's lymphoma ([place])    on chemotherapy   Persistent atrial fibrillation    a. s/p PVI 2008 b. s/p convergent ablation 1583 complicated by bradycardia requiring pacemaker implant   Personal history of radiation therapy    Presence of permanent cardiac pacemaker    Rotator cuff tear    Right   Sinus node dysfunction ([place])    Complicating convergent ablation 6/14   Stroke ([place])    2003- [place] x2   SVC syndrome    with lung mass and non hodgkins lymphoma   Thyroid cancer ([place]) 2000    Patient Active Problem List   Diagnosis Date Noted   Gait abnormality 07/10/2018   S/P reverse total shoulder arthroplasty, 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    Persistent atrial fibrillation    Bilateral ureteral calculi 11/17/2017   Atrial fibrillation with RVR ([place]) 09/15/2017   Atrial flutter ([place]) 07/17/2017   Port catheter in place 04/02/2017   Non-Hodgkin lymphoma, unspecified, intrathoracic lymph nodes ([place]) 02/13/2017   Mediastinal mass 02/06/2017   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   Abnormal chest x-ray    Lung mass 02/03/2017   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   OSA (obstructive sleep apnea) 02/03/2015   History of renal calculi 11/16/2014   Renal calculi 11/16/2014   Family history of colon cancer 10/26/2014   Mechanical complication due to cardiac pacemaker pulse generator 11/06/2013   Dyspnea 05/12/2013   Hypothyroidism 04/21/2013   Pleural effusion 04/19/2013   Long term (current) use of anticoagulants 12/20/2010   EPIDERMOID CYST 08/22/2010   Chronic diastolic heart failure ([place]) 08/06/2010   SYNCOPE AND COLLAPSE 07/27/2010   OSTEOARTHRITIS, KNEE, RIGHT 03/15/2010   Morbid obesity ([place]) 12/07/2009   NONSPEC ELEVATION OF LEVELS OF TRANSAMINASE/LDH 09/08/2009   BREAST CANCER, HX OF 07/25/2009   COLONIC POLYPS, HX OF 07/25/2009   TUBULOVILLOUS ADENOMA, 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 2008 PVI b. 2014 convergent ablation   APPENDECTOMY     BONE MARROW BIOPSY  02/21/2017   BREAST LUMPECTOMY Left 2010   [place] CATHETERIZATION     2015- negative   CARDIOVERSION  10/09/2012   Procedure: CARDIOVERSION;  Surgeon: Minus Breeding, MD;  Location: [facility];  Service: Cardiovascular;  Laterality: N/A;   CARDIOVERSION  10/09/2012   Procedure: CARDIOVERSION;  Surgeon: Minus Breeding, MD;  Location: [facility] ENDOSCOPY;  Service: Cardiovascular;  Laterality: N/A;  [name]  gave the ok to add pt to the add on , but we must check to find out if the can add pt on at [phone] ( [phone])   CARDIOVERSION N/A 11/20/2012   Procedure: CARDIOVERSION;  Surgeon: Fay Records, MD;  Location: [facility];  Service: Cardiovascular;  Laterality: N/A;   CARDIOVERSION N/A 07/18/2017   Procedure: CARDIOVERSION;  Surgeon: Thayer Headings, MD;  Location: [facility] ENDOSCOPY;  Service: Cardiovascular;  Laterality: N/A;   CARDIOVERSION N/A 10/03/2017   Procedure: CARDIOVERSION;  Surgeon: Sanda Klein, MD;  Location: [facility] ENDOSCOPY;  Service: Cardiovascular;  Laterality: N/A;   CARDIOVERSION N/A 01/07/2018   Procedure: CARDIOVERSION;  Surgeon: Acie Fredrickson Wonda Cheng, MD;  Location: [facility] ENDOSCOPY;  Service: Cardiovascular;  Laterality:  N/A;   CHOLECYSTECTOMY     COLONOSCOPY W/ POLYPECTOMY     Dr Earlean Shawl   CYSTOSCOPY N/A 02/06/2015   Procedure: CYSTOSCOPY;  Surgeon: Kathie Rhodes, MD;  Location: [facility];  Service: Urology;  Laterality: N/A;   CYSTOSCOPY W/ RETROGRADES Left 11/17/2017   Procedure: CYSTOSCOPY WITH RETROGRADE /PYELOGRAM/;  Surgeon: Kathie Rhodes, MD;  Location: [facility];  Service: Urology;  Laterality: Left;   CYSTOSCOPY WITH RETROGRADE PYELOGRAM, URETEROSCOPY AND STENT PLACEMENT Right 02/06/2015   Procedure: RETROGRADE PYELOGRAM, RIGHT URETEROSCOPY STENT PLACEMENT;  Surgeon: Kathie Rhodes, MD;  [facility];  Service: Urology;  Laterality: Right;   CYSTOSCOPY WITH RETROGRADE PYELOGRAM, URETEROSCOPY AND STENT PLACEMENT Right 03/07/2017   Procedure: CYSTOSCOPY WITH RIGHT RETROGRADE PYELOGRAM,RIGHT  URETEROSCOPYLASER LITHOTRIPSY  AND STENT PLACEMENT AND STONE BASKETRY;  Surgeon: Kathie Rhodes, MD;  Location: [facility];  Service: Urology;  Laterality: Right;   EYE SURGERY     cataract surgery   fatty mass removal  1999   pubic area   HOLMIUM LASER APPLICATION N/A 6/31/4970   Procedure: HOLMIUM LASER APPLICATION;  Surgeon: Kathie Rhodes, MD;  [facility];  Service:  Urology;  Laterality: N/A;   HOLMIUM LASER APPLICATION Right 2/63/7858   Procedure: HOLMIUM LASER APPLICATION;  Surgeon: Kathie Rhodes, MD;  Location: [facility];  Service: Urology;  Laterality: Right;   HOLMIUM LASER APPLICATION Left 8/50/2774   Procedure: HOLMIUM LASER APPLICATION;  Surgeon: Kathie Rhodes, MD;  [facility];  Service: Urology;  Laterality: Left;   IR FLUORO GUIDE PORT INSERTION RIGHT  02/24/2017   IR NEPHROSTOMY PLACEMENT RIGHT  11/17/2017   IR PATIENT EVAL TECH 0-60 MINS  03/11/2017   IR REMOVAL TUN ACCESS W/ PORT W/O FL MOD SED  04/20/2018   IR US GUIDE VASC ACCESS RIGHT  02/24/2017   KNEE ARTHROSCOPY     bilateral   LAPAROSCOPIC GASTRIC BANDING  07/10/2010   LAPAROSCOPIC GASTRIC BANDING     Laparoscopic adjustable banding APS System with posterior hiatal hernia, 2 suture.   LAPAROTOMY     for ruptured ovary and ovarian artery    NEPHROLITHOTOMY Right 11/17/2017   Procedure: NEPHROLITHOTOMY PERCUTANEOUS;  Surgeon: Kathie Rhodes, MD;  [facility];  Service: Urology;  Laterality: Right;   PACEMAKER INSERTION  03/10/2013   MDT dual chamber PPM   POCKET REVISION N/A 12/08/2013   Procedure: POCKET REVISION;  Surgeon: Deboraha Sprang, MD;  Location: [facility] CATH LAB;  Service: Cardiovascular;  Laterality: N/A;   PORTA CATH INSERTION     REVERSE SHOULDER ARTHROPLASTY Right 05/14/2018   Procedure: RIGHT REVERSE SHOULDER ARTHROPLASTY;  Surgeon: Tania Ade, MD;  Location: [facility];  Service: Orthopedics;  Laterality: Right;   REVERSE SHOULDER REPLACEMENT Right 05/14/2018   TEE WITH CARDIOVERSION  09/22/2017   TEE WITHOUT CARDIOVERSION N/A 10/03/2017   Procedure: TRANSESOPHAGEAL ECHOCARDIOGRAM (TEE);  Surgeon: Sanda Klein, MD;  Location: [facility];  Service: Cardiovascular;  Laterality: N/A;   THYROIDECTOMY  1998   Dr Margot Chimes   TONSILLECTOMY     TOTAL KNEE ARTHROPLASTY  04/13/2012   Procedure: TOTAL KNEE ARTHROPLASTY;  Surgeon: Rudean Haskell, MD;  Location: [facility];  Service: Orthopedics;  Laterality: Right;   VIDEO BRONCHOSCOPY WITH ENDOBRONCHIAL ULTRASOUND N/A 02/07/2017   Procedure: VIDEO BRONCHOSCOPY WITH ENDOBRONCHIAL ULTRASOUND;  Surgeon: Marshell Garfinkel, MD;  Location: [facility];  Service: Pulmonary;  Laterality: N/A;     OB History   No obstetric history on file.  Home Medications    Prior to Admission medications   Medication Sig Start Date End Date Taking? Authorizing Provider  acetaminophen (TYLENOL) 500 MG tablet Take 1,000 mg by mouth at bedtime.     [provider]  amiodarone (PACERONE) 200 MG tablet Take 1 tablet (200 mg total) by mouth daily. 12/24/18   Deboraha Sprang, MD  apixaban (ELIQUIS) 5 MG TABS tablet Take 1 tablet (5 mg total) by mouth 2 (two) times daily. 12/08/18   Deboraha Sprang, MD  levothyroxine (SYNTHROID, LEVOTHROID) 175 MCG tablet Take 175 mcg by mouth See admin instructions. TAKE 1 TABLET (175 MCG) BY MOUTH DAILY, EXCEPT FRIDAYS.    [provider]  Lifitegrast Shirley Friar) 5 % SOLN Apply 1 drop to eye at bedtime.     [provider]  LORazepam (ATIVAN) 1 MG tablet Take 1 tablet (1 mg total) by mouth 3 (three) times daily as needed for anxiety. 06/27/18   Mesner, Corene Cornea, MD  meclizine (ANTIVERT) 25 MG tablet Take 1 tablet (25 mg total) by mouth 3 (three) times daily as needed for dizziness. 06/27/18   Mesner, Corene Cornea, MD  Multiple Vitamin (MULTIVITAMIN) tablet Take 1 tablet by mouth daily.     [provider]  rosuvastatin (CRESTOR) 10 MG tablet Take 10 mg by mouth every Wednesday. In the morning    [provider]  traMADol (ULTRAM) 50 MG tablet Take 50 mg by mouth 2 (two) times daily as needed (for moderate shoulder pain.).    [provider]    Family History Family History  Problem Relation Age of Onset   Heart disease Father 62       MI _0    Colon cancer Father        COLON   Heart attack Father    Other Mother         temporal arteritis    Diabetes Sister    Diabetes Brother    Diabetes Paternal Aunt    Diabetes Paternal Grandmother     Social History Social History   Tobacco Use   Smoking status: Never Smoker   Smokeless tobacco: Never Used  Substance Use Topics   Alcohol use: No    Comment: none since 1990   Drug use: Never     Allergies   Dofetilide; Rivaroxaban; Epinephrine; Adhesive [tape]; and Ketamine   Review of Systems Review of Systems Ten systems reviewed and are negative for acute change, except as noted in the HPI.    Physical Exam Updated Vital Signs Temp 97.6 F (36.4 C) (Oral)    Wt 93.8 kg    BMI 33.38 kg/m   Physical Exam Vitals signs and nursing note reviewed.  Constitutional:      General: She is not in acute distress.    Appearance: She is well-developed. She is not diaphoretic.  HENT:     Head: Normocephalic.     Comments: Tenderness over the right zygoma, mild swelling without ecchymosis.  No step-off or bony tenderness Eyes:     General: No scleral icterus.    Conjunctiva/sclera: Conjunctivae normal.  Neck:     Musculoskeletal: Normal range of motion.  Cardiovascular:     Rate and Rhythm: Normal rate and regular rhythm.     Heart sounds: Normal heart sounds. No murmur. No friction rub. No gallop.   Pulmonary:     Effort: Pulmonary effort is normal. No respiratory distress.     Breath sounds: Normal breath sounds.  Abdominal:  General: Bowel sounds are normal. There is no distension.     Palpations: Abdomen is soft. There is no mass.     Tenderness: There is no abdominal tenderness. There is no guarding.  Musculoskeletal:     Comments: Tenderness over the left SI joint, midline lumbar spinous process tenderness.  Able to lift both legs. Pain with any twisting flexion of the lumbar spine.  Normal DTRs bilaterally  Skin:    General: Skin is warm and dry.  Neurological:     Mental Status: She is alert and oriented to person, place, and  time.  Psychiatric:        Behavior: Behavior normal.      ED Treatments / Results  Labs (all labs ordered are listed, but only abnormal results are displayed) Labs Reviewed - No data to display  EKG EKG Interpretation  Date/Time:  Saturday January 02 2019 10:09:59 EDT Ventricular Rate:  112 PR Interval:    QRS Duration: 114 QT Interval:  367 QTC Calculation: 501 R Axis:   36 Text Interpretation:  probable Sinus tachycardia Borderline intraventricular conduction delay Prolonged QT interval Confirmed by Sherwood Gambler [id number]) on 01/02/2019 10:16:22 AM   Radiology Dg Chest 2 View  Result Date: 01/02/2019 CLINICAL DATA:  Syncope EXAM: CHEST - 2 VIEW COMPARISON:  12/17/2018 chest radiograph. FINDINGS: Stable configuration of 2 lead left subclavian pacemaker. Left atrial appendage clip in place. Partially visualized right total shoulder arthroplasty. Surgical clips overlie the medial lower neck bilaterally. Stable cardiomediastinal silhouette with normal heart size. No pneumothorax. No pleural effusion. No pulmonary edema. Small nodular opacity at the right lung base appears new. IMPRESSION: Small nodular opacity at the right lung base appears new, more likely infectious or inflammatory or artifactual given the short imaging interval. Recommend attention on follow-up PA and lateral chest radiographs in 1-2 months. Electronically Signed   By: Ilona Sorrel M.D.   On: 01/02/2019 11:57   Dg Lumbar Spine Complete  Result Date: 01/02/2019 CLINICAL DATA:  Syncope yesterday. Worsening back pain. History of lung and breast cancer. EXAM: LUMBAR SPINE - COMPLETE 4+ VIEW COMPARISON:  08/21/2018 lumbar spine MRI. FINDINGS: This report assumes 5 non rib-bearing lumbar vertebrae. Intact appearing gastric band in expected location overlying the proximal stomach with port overlying lower abdomen. Pacemaker lead terminates over the right ventricular apex. Lumbar vertebral body heights are preserved, with no  fracture. Moderate multilevel degenerative disc disease throughout the visualized thoracolumbar spine, most prominent at L3-4. No spondylolisthesis. Mild bilateral lower lumbar facet arthropathy. No aggressive appearing focal osseous lesions. IMPRESSION: 1. No evidence of lumbar spine fracture or acute malalignment. 2. Moderate multilevel lumbar degenerative disc disease, most prominent at L3-4. 3. Mild bilateral lower lumbar facet arthropathy. Electronically Signed   By: Ilona Sorrel M.D.   On: 01/02/2019 12:02   Ct Head Wo Contrast  Result Date: 01/02/2019 CLINICAL DATA:  Fall.  On anticoagulation EXAM: CT HEAD WITHOUT CONTRAST TECHNIQUE: Contiguous axial images were obtained from the base of the skull through the vertex without intravenous contrast. COMPARISON:  MRI head 07/29/2018 FINDINGS: Brain: Moderate to extensive chronic white matter changes. No acute infarct, hemorrhage, or mass. Chronic infarct head of caudate on the left. Ventricle size normal with mild atrophy Vascular: Negative for hyperdense vessel Skull: Negative for skull fracture Sinuses/Orbits: Paranasal sinuses clear.  Bilateral cataract surgery Other: None IMPRESSION: No acute intracranial abnormality. Moderate to extensive chronic microvascular ischemic change in the white matter. Electronically Signed   By: Franchot Gallo M.D.  On: 01/02/2019 15:01   Ct Lumbar Spine Wo Contrast  Result Date: 01/02/2019 CLINICAL DATA:  Recent fall.  Back pain EXAM: CT LUMBAR SPINE WITHOUT CONTRAST TECHNIQUE: Multidetector CT imaging of the lumbar spine was performed without intravenous contrast administration. Multiplanar CT image reconstructions were also generated. COMPARISON:  Lumbar radiographs 01/02/2019 FINDINGS: Segmentation: Normal Alignment: Normal Vertebrae: Negative for fracture Patchy sclerotic changes throughout all the vertebral bodies which are symmetric and similar. This is likely related to osteoporosis. Paraspinal and other soft  tissues: Atherosclerotic calcification without aneurysm. Left renal calculus without hydronephrosis. No paraspinous mass or hematoma. Laparoscopic band at the GE junction. Disc levels: Mild disc and facet degeneration throughout the lumbar spine without significant stenosis. IMPRESSION: Negative for lumbar fracture Mild degenerative changes. Electronically Signed   By: Franchot Gallo M.D.   On: 01/02/2019 14:58    Procedures .Critical Care Performed by: Margarita Mail, PA-C Authorized by: Margarita Mail, PA-C   Critical care provider statement:    Critical care time (minutes):  43   Critical care was necessary to treat or prevent imminent or life-threatening deterioration of the following conditions:  Cardiac failure   Critical care was time spent personally by me on the following activities:  Discussions with consultants, evaluation of patient's response to treatment, examination of patient, ordering and performing treatments and interventions, ordering and review of laboratory studies, ordering and review of radiographic studies, pulse oximetry, re-evaluation of patient's condition, obtaining history from patient or surrogate and review of old charts   (including critical care time)  Sedation and cardioversion- please refer to Dr. Regenia Skeeter notes 01/02/2019  Medications Ordered in ED Medications  sodium chloride 0.9 % bolus 1,000 mL (0 mLs Intravenous Stopped 01/02/19 1409)  diltiazem (CARDIZEM) injection 10 mg (10 mg Intravenous Given 01/02/19 1322)  propofol (DIPRIVAN) 10 mg/mL bolus/IV push (50 mg Intravenous Given 01/02/19 1531)     Initial Impression / Assessment and Plan / ED Course  I have reviewed the triage vital signs and the nursing notes.  Pertinent labs & imaging results that were available during my care of the patient were reviewed by me and considered in my medical decision making (see chart for details).  Clinical Course as of Jan 02 726  Sat Jan 02, 2019  1517  Patient CT head and lumbar spine showed no acute fractures or other abnormalities other than chronic degenerative changes.  I spoke with Dr. Caryl Comes of cardiology who asked that we cardiovert the patient.  We are obtaining consent and will progress with the procedure.   [AH]    Clinical Course User Index [AH] Margarita Mail, PA-C       JO:INOMVEH/ back pain VS: BP (!) 151/99    Pulse 80    Temp 97.6 F (36.4 C) (Oral)    Resp 20    Wt 93.8 kg    SpO2 100%    BMI 33.38 kg/m  MC:NOBSJGG is gathered by The patienti and review of EMR, phone consult with the patient's cardiologist Dr. Caryl Comes. DDX:The differential for syncope is extensive and includes, but is not limited to: arrythmia (Vtach, SVT, SSS, sinus arrest, AV block, bradycardia) aortic stenosis, AMI, HOCM, PE, atrial myxoma, pulmonary hypertension, orthostatic hypotension, (hypovolemia, drug effect, GB syndrome, micturition, cough, swall) carotid sinus sensitivity, Seizure, TIA/CVA, hypoglycemia,  Vertigo.  The emergent differential diagnosis for back pain includes but is not limited to fracture, muscle strain, cauda equina, spinal stenosis. DDD, ankylosing spondylitis, acute ligamentous injury, disk herniation, spondylolisthesis, Epidural compression syndrome, metastatic  cancer, transverse myelitis, vertebral osteomyelitis, diskitis, kidney stone, pyelonephritis, AAA, Perforated ulcer, Retrocecal appendicitis, pancreatitis, bowel obstruction, retroperitoneal hemorrhage or mass, meningitis. Labs: I reviewed the labs which show mild hypoproteinemia. Imaging: I personally reviewed the images (2 view chest x-ray, plain view of the lumbar spine, CT head and CT lumbar spine.) which show(s) no acute abnormalities.   EKG: A-flutter with 2:1 conduction prolonged QT MDM: Here with injury after syncopal event 4 days ago.  The patient pacemaker was interrogated and showed an atrial rate of 230 with a ventricular rate of about 112.  Difficult to see P waves  on the EKG and this is very likely atrial flutter with a 2-1 conduction rate.  I spoke with Dr. Caryl Comes who recommended cardioversion.  Patient was cardioverted successfully.  I believe that the abnormal tachycardia arrhythmia was a cause of the patient's syncopal event earlier this week.  Appears to have a musculoskeletal strain injury.  No evidence of fracture on CT.  Patient is advised to follow with her PCP should she continue to have severe pain and we discussed red flag symptoms and return precautions.  We will add on tramadol and lidocaine patches for her muscular pain.  Patient will follow-up with her cardiologist in the next 2 days Patient disposition: Discharge Patient condition: Good. The patient appears reasonably screened and/or stabilized for discharge and I doubt any other medical condition or other [facility] requiring further screening, evaluation, or treatment in the ED at this time prior to discharge. I have discussed lab and/or imaging findings with the patient and answered all questions/concerns to the best of my ability. I have discussed return precautions and OP follow up.      Final Clinical Impressions(s) / ED Diagnoses   Final diagnoses:  None    ED Discharge Orders    None       Margarita Mail, PA-C 01/03/19 0739    Sherwood Gambler, MD 01/04/19 1410    Margarita Mail, PA-C 01/21/19 1145    Sherwood Gambler, MD 01/21/19 [phone]

## 2019-01-02 NOTE — ED Provider Notes (Signed)
Physical Exam  BP (!) 155/112   Pulse (!) 112   Temp 97.6 F (36.4 C) (Oral)   Resp 18   Wt 93.8 kg   SpO2 100%   BMI 33.38 kg/m   Physical Exam  ED Course/Procedures     .Sedation Date/Time: 01/02/2019 3:35 PM Performed by: Sherwood Gambler, MD Authorized by: Sherwood Gambler, MD   Consent:    Consent obtained:  Verbal   Consent given by:  Patient   Risks discussed:  Allergic reaction, dysrhythmia, inadequate sedation, nausea, prolonged hypoxia resulting in organ damage, prolonged sedation necessitating reversal, respiratory compromise necessitating ventilatory assistance and intubation and vomiting   Alternatives discussed:  Analgesia without sedation, anxiolysis and regional anesthesia Universal protocol:    Procedure explained and questions answered to patient or proxy's satisfaction: yes     Relevant documents present and verified: yes     Test results available and properly labeled: yes     Imaging studies available: yes     Required blood products, implants, devices, and special equipment available: yes     Site/side marked: yes     Immediately prior to procedure a time out was called: yes     Patient identity confirmation method:  Verbally with patient Indications:    Procedure necessitating sedation performed by:  Physician performing sedation Pre-sedation assessment:    Time since last food or drink:  Last night   ASA classification: class 3 - patient with severe systemic disease     Neck mobility: normal     Mouth opening:  3 or more finger widths   Thyromental distance:  4 finger widths   Mallampati score:  I - soft palate, uvula, fauces, pillars visible   Pre-sedation assessments completed and reviewed: airway patency, cardiovascular function, hydration status, mental status, nausea/vomiting, pain level, respiratory function and temperature   Immediate pre-procedure details:    Reassessment: Patient reassessed immediately prior to procedure     Reviewed:  vital signs, relevant labs/tests and NPO status     Verified: bag valve mask available, emergency equipment available, intubation equipment available, IV patency confirmed, oxygen available and suction available   Procedure details (see MAR for exact dosages):    Preoxygenation:  Nasal cannula   Sedation:  Propofol   Intra-procedure monitoring:  Blood pressure monitoring, cardiac monitor, continuous pulse oximetry, frequent LOC assessments, frequent vital sign checks and continuous capnometry   Intra-procedure events: none     Total Provider sedation time (minutes):  5 Post-procedure details:    Attendance: Constant attendance by certified staff until patient recovered     Recovery: Patient returned to pre-procedure baseline     Post-sedation assessments completed and reviewed: airway patency, cardiovascular function, hydration status, mental status, nausea/vomiting, pain level, respiratory function and temperature     Patient is stable for discharge or admission: yes     Patient tolerance:  Tolerated well, no immediate complications .Cardioversion Date/Time: 01/02/2019 3:36 PM Performed by: Sherwood Gambler, MD Authorized by: Sherwood Gambler, MD   Consent:    Consent obtained:  Verbal and written   Consent given by:  Patient   Risks discussed:  Death, induced arrhythmia, pain and cutaneous burn   Alternatives discussed:  No treatment and rate-control medication Pre-procedure details:    Cardioversion basis:  Emergent   Rhythm:  Atrial flutter   Electrode placement:  Anterior-posterior Patient sedated: Yes. Refer to sedation procedure documentation for details of sedation.  Attempt one:    Cardioversion mode:  Synchronous   Shock (  Joules):  200   Shock outcome:  Conversion to normal sinus rhythm    MDM  Patient with symptomatic atrial flutter. Has been compliant with eliquis. Cardioverted without immediate complication.  CHA2DS2/VAS Stroke Risk Points  Current as of 19 minutes  ago     5 >= 2 Points: High Risk  1 - 1.99 Points: Medium Risk  0 Points: Low Risk    This is the only CHA2DS2/VAS Stroke Risk Points available for the past  year.:  Last Change: N/A     Details    This score determines the patient's risk of having a stroke if the  patient has atrial fibrillation.       Points Metrics  1 Has Congestive Heart Failure:  Yes    Current as of 19 minutes ago  0 Has Vascular Disease:  No    Current as of 19 minutes ago  0 Has Hypertension:  No    Current as of 19 minutes ago  1 Age:  73    Current as of 19 minutes ago  0 Has Diabetes:  No    Current as of 19 minutes ago  2 Had Stroke:  Yes  Had TIA:  No  Had thromboembolism:  No    Current as of 19 minutes ago  1 Female:  Yes    Current as of 19 minutes ago               Sherwood Gambler, MD 01/02/19 1543

## 2019-01-04 ENCOUNTER — Telehealth (INDEPENDENT_AMBULATORY_CARE_PROVIDER_SITE_OTHER): Payer: Medicare Other | Admitting: Internal Medicine

## 2019-01-04 ENCOUNTER — Telehealth: Payer: Self-pay | Admitting: Internal Medicine

## 2019-01-04 VITALS — BP 100/69 | HR 95 | Ht 66.0 in | Wt 190.0 lb

## 2019-01-04 DIAGNOSIS — I4819 Other persistent atrial fibrillation: Secondary | ICD-10-CM

## 2019-01-04 DIAGNOSIS — Z950 Presence of cardiac pacemaker: Secondary | ICD-10-CM | POA: Diagnosis not present

## 2019-01-04 DIAGNOSIS — I4892 Unspecified atrial flutter: Secondary | ICD-10-CM | POA: Diagnosis not present

## 2019-01-04 DIAGNOSIS — I442 Atrioventricular block, complete: Secondary | ICD-10-CM | POA: Diagnosis not present

## 2019-01-04 DIAGNOSIS — I495 Sick sinus syndrome: Secondary | ICD-10-CM | POA: Diagnosis not present

## 2019-01-04 NOTE — Progress Notes (Signed)
Electrophysiology TeleHealth Note   Due to national recommendations of social distancing due to COVID 19, an audio/video telehealth visit is felt to be most appropriate for this patient at this time.  See MyChart message from today for the patient's consent to telehealth for [facility].   Date:  01/04/2019   ID:  Kayla Harrison, DOB Jan 05, 1946, MRN [id number]  Location: patient's home  Provider location: [address] [place]  Evaluation Performed: Follow-up visit  PCP:  [name], MD  Cardiologist:  [name], MD *  Electrophysiologist:     Chief Complaint:  Atrial flutter and orthostatic lightheadedness   History of Present Illness:    Kayla Harrison is a 73 y.o. female who presents via audio/video conferencing for a telehealth visit today.  Since last being seen in our clinic, the patient reports a visit to ER  See Below   Followed for pacemaker implanted at [facility] and atrial fibrillation.  She underwent pulmonary vein isolation at [facility] and had recurrent atrial fibrillation and underwent convergent ablation at [facility] 0/03; it was complicated by heart block prompting pacemaker implantation.   5/18 presented w SOB and facial fullness, found to have SVC syndrome 2/2 NHlymphoma Rx initially with XRT  Now in remission  She has had recurrent aFib and  was restarted on amio--and then stopped 2/2 worsening SOB  She has a history of prior strokes and remains on oral anticoagulation . She is on apixoban and tolerating well.    Hospitalized 12/18 for wide-complex tachycardia which was thought to be related to intermittent pacing because of atrial flutter with blanking Reprogrammed DDIR.  Amiodarone resumed   Worsening symptoms of dizziness and SOB, two episodes of syncope.  One occurred during aflutter the other with sinus.  Head trauma and back trauma  >> called [place] at home over weekend with above complaints  Referred to ER-- CT neg DCCV  And now  much improved  Still with some OI    Date TSH ALT Hgb  8/18 0.2 15 11.9  9/18                     9.3  3/19 0.722 (12/18) 23 11.2  4/19 0.2          Levthyroixine dose decreased by Dr Dorothea Ogle 11/19; repeat blood work pending 5/20   DATE TEST    2011 Echo    EF 55 % LAE 40 mm  7/14 Echo    EF 55-60 %   9/18 Echo  EF 55-60%   12/18 Echo  EF 55-60% Severe LAE ?     The patient denies symptoms of fevers, chills, cough, or new SOB worrisome for COVID 19.    Past Medical History:  Diagnosis Date  . Anemia   . Arthritis    osteoarthritis - knees and right shoulder  . Atrial fibrillation [facility])    ablation- 2x's-- 1st time- [facility], 2nd event at [facility] in 2008. Convergent ablation at [facility] 6/14  . Atrial fibrillation ([place])   . Blood transfusion without reported diagnosis   . Breast cancer ([place])    Dr Margot Chimes, total thyroidectomy- 1999- for cancer  . 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 Chronic bilateral pleural effusions   . Colon polyp    Dr Earlean Shawl  . Complete heart block ([place])   . Complication of anesthesia    Ketamine produces LSD reaction, bright colored nightmarish experience   . Dyslipidemia   .  Dyspnea   . Endometriosis   . Fibroids   . H/O pleural effusion    s/p thoracentesis w 3244m withdrawn  . Hepatitis    [place]hepatitis   . History of dysphagia    due to radiation therapy  . History of hiatal hernia    small noted on PET scan  . History of kidney stones   . Hx of thyroid cancer    Dr SForde Dandy . Hyperlipidemia   . Hypertension   . Hypothyroidism   . Lung cancer[place]   radiation RX completed 03/04/17; will start chemo 6/27, pt unaware of lung cancer  . Morbid obesity ([place]    Status post lap band surgery  . Nephrolithiasis   . Non Hodgkin's lymphoma ([place]    on chemotherapy  . Persistent atrial fibrillation    a. s/p PVI 2008 b. s/p convergent ablation [id number] by bradycardia requiring  pacemaker implant  . Personal history of radiation therapy   . Presence of permanent cardiac pacemaker   . Rotator cuff tear    Right  . Sinus node dysfunction ([place]    Complicating convergent ablation 6/14  . [place]    [place]  . SVC syndrome    with lung mass and non hodgkins lymphoma  . Thyroid cancer ([place] 2000    Past Surgical History:  Procedure Laterality Date  . ABDOMINAL HYSTERECTOMY  1983  . afib ablation     a. 2008 PVI b. 2014 convergent ablation  . APPENDECTOMY    . BONE MARROW BIOPSY  02/21/2017  . BREAST LUMPECTOMY Left 2010  . bso  1998  . CARDIAC CATHETERIZATION     2015- negative  . CARDIOVERSION  10/09/2012   Procedure: CARDIOVERSION;  Surgeon: JMinus Breeding MD;  [place]  Service: Cardiovascular;  Laterality: N/A;  . CARDIOVERSION  10/09/2012   Procedure: CARDIOVERSION;  Surgeon: JMinus Breeding MD;  [facility]  Service: Cardiovascular;  Laterality: N/A;  [name] the ok to add pt to the add on , but we must check to find out if the can add pt on at [phone] ( [phone])  . CARDIOVERSION N/A 11/20/2012   Procedure: CARDIOVERSION;  Surgeon: PFay Records MD;  [facility]  Service: Cardiovascular;  Laterality: N/A;  . CARDIOVERSION N/A 07/18/2017   Procedure: CARDIOVERSION;  Surgeon: NThayer Headings MD;  [facility]  Service: Cardiovascular;  Laterality: N/A;  . CARDIOVERSION N/A 10/03/2017   Procedure: CARDIOVERSION;  Surgeon: CSanda Klein MD;  [facility]  Service: Cardiovascular;  Laterality: N/A;  . CARDIOVERSION N/A 01/07/2018   Procedure: CARDIOVERSION;  Surgeon: NAcie FredricksonPWonda Cheng MD;  [place]  Service: Cardiovascular;  Laterality: N/A;  . CHOLECYSTECTOMY    . COLONOSCOPY W/ POLYPECTOMY     Dr MEarlean Shawl . CYSTOSCOPY N/A 02/06/2015   Procedure: CYSTOSCOPY;  Surgeon: MKathie Rhodes MD;  [facility];  Service: Urology;  Laterality: N/A;  . CYSTOSCOPY W/ RETROGRADES Left  11/17/2017   Procedure: CYSTOSCOPY WITH RETROGRADE /PYELOGRAM/;  Surgeon: OKathie Rhodes MD;  [facility];  Service: Urology;  Laterality: Left;  . CYSTOSCOPY WITH RETROGRADE PYELOGRAM, URETEROSCOPY AND STENT PLACEMENT Right 02/06/2015   Procedure: RETROGRADE PYELOGRAM, RIGHT URETEROSCOPY STENT PLACEMENT;  Surgeon: MKathie Rhodes MD;  [facility];  Service: Urology;  Laterality: Right;  . CYSTOSCOPY WITH RETROGRADE PYELOGRAM, URETEROSCOPY AND STENT PLACEMENT Right 03/07/2017   Procedure:  CYSTOSCOPY WITH RIGHT RETROGRADE PYELOGRAM,RIGHT  URETEROSCOPYLASER LITHOTRIPSY  AND STENT PLACEMENT AND STONE BASKETRY;  Surgeon: Kathie Rhodes, MD;  Location: [place];  Service: Urology;  Laterality: Right;  . EYE SURGERY     cataract surgery  . fatty mass removal  1999   pubic area  . HOLMIUM LASER APPLICATION N/A 01/31/2584   Procedure: HOLMIUM LASER APPLICATION;  Surgeon: Kathie Rhodes, MD;  [facility];  Service: Urology;  Laterality: N/A;  . HOLMIUM LASER APPLICATION Right 2/77/8242   Procedure: HOLMIUM LASER APPLICATION;  Surgeon: Kathie Rhodes, MD;  Location[facility];  Service: Urology;  Laterality: Right;  . HOLMIUM LASER APPLICATION Left 3/53/6144   Procedure: HOLMIUM LASER APPLICATION;  Surgeon: Kathie Rhodes, MD;  [facility];  Service: Urology;  Laterality: Left;  . IR FLUORO GUIDE PORT INSERTION RIGHT  02/24/2017  . IR NEPHROSTOMY PLACEMENT RIGHT  11/17/2017  . IR PATIENT EVAL TECH 0-60 MINS  03/11/2017  . IR REMOVAL TUN ACCESS W/ PORT W/O FL MOD SED  04/20/2018  . IR US GUIDE VASC ACCESS RIGHT  02/24/2017  . KNEE ARTHROSCOPY     bilateral  . LAPAROSCOPIC GASTRIC BANDING  07/10/2010  . LAPAROSCOPIC GASTRIC BANDING     Laparoscopic adjustable banding APS System with posterior hiatal hernia, 2 suture.  Marland Kitchen LAPAROTOMY     for ruptured ovary and ovarian artery   . NEPHROLITHOTOMY Right 11/17/2017   Procedure: NEPHROLITHOTOMY PERCUTANEOUS;  Surgeon:  Kathie Rhodes, MD;  [facility];  Service: Urology;  Laterality: Right;  . PACEMAKER INSERTION  03/10/2013   MDT dual chamber PPM  . POCKET REVISION N/A 12/08/2013   Procedure: POCKET REVISION;  Surgeon: Deboraha Sprang, MD;  Location[facility] LAB;  Service: Cardiovascular;  Laterality: N/A;  . PORTA CATH INSERTION    . REVERSE SHOULDER ARTHROPLASTY Right 05/14/2018   Procedure: RIGHT REVERSE SHOULDER ARTHROPLASTY;  Surgeon: Tania Ade, MD;  Location: [place];  Service: Orthopedics;  Laterality: Right;  . REVERSE SHOULDER REPLACEMENT Right 05/14/2018  . TEE WITH CARDIOVERSION  09/22/2017  . TEE WITHOUT CARDIOVERSION N/A 10/03/2017   Procedure: TRANSESOPHAGEAL ECHOCARDIOGRAM (TEE);  Surgeon: Sanda Klein, MD;  Location: [place];  Service: Cardiovascular;  Laterality: N/A;  . THYROIDECTOMY  1998   Dr Margot Chimes  . TONSILLECTOMY    . TOTAL KNEE ARTHROPLASTY  04/13/2012   Procedure: TOTAL KNEE ARTHROPLASTY;  Surgeon: Rudean Haskell, MD;  Location: [place];  Service: Orthopedics;  Laterality: Right;  Marland Kitchen VIDEO BRONCHOSCOPY WITH ENDOBRONCHIAL ULTRASOUND N/A 02/07/2017   Procedure: VIDEO BRONCHOSCOPY WITH ENDOBRONCHIAL ULTRASOUND;  Surgeon: Marshell Garfinkel, MD;  Location: [place];  Service: Pulmonary;  Laterality: N/A;    Current Outpatient Medications  Medication Sig Dispense Refill  . acetaminophen (TYLENOL) 500 MG tablet Take 1,000 mg by mouth at bedtime.     Marland Kitchen amiodarone (PACERONE) 200 MG tablet Take 1 tablet (200 mg total) by mouth daily. 90 tablet 3  . apixaban (ELIQUIS) 5 MG TABS tablet Take 1 tablet (5 mg total) by mouth 2 (two) times daily. 180 tablet 2  . levothyroxine (SYNTHROID, LEVOTHROID) 175 MCG tablet Take 175 mcg by mouth See admin instructions. TAKE 1 TABLET (175 MCG) BY MOUTH DAILY, EXCEPT FRIDAYS.    Marland Kitchen Lifitegrast (XIIDRA) 5 % SOLN Apply 1 drop to eye at bedtime.     . Multiple Vitamin (MULTIVITAMIN) tablet Take 1 tablet by mouth daily.     . rosuvastatin (CRESTOR) 10 MG  tablet Take 10  mg by mouth every Wednesday. In the morning    . traMADol (ULTRAM) 50 MG tablet Take 1 tablet (50 mg total) by mouth every 6 (six) hours as needed. 15 tablet 0  . lidocaine (LIDODERM) 5 % Place 1 patch onto the skin daily. Remove & Discard patch within 12 hours or as directed by MD (Patient not taking: Reported on 01/04/2019) 15 patch 0   No current facility-administered medications for this visit.     Allergies:   Dofetilide; Rivaroxaban; Epinephrine; Adhesive [tape]; and Ketamine   Social History:  The patient  reports that she has never smoked. She has never used smokeless tobacco. She reports that she does not drink alcohol or use drugs.   Family History:  The patient's   family history includes Colon cancer in her father; Diabetes in her brother, paternal aunt, paternal grandmother, and sister; Heart attack in her father; Heart disease (age of onset: 17) in her father; Other in her mother.   ROS:  Please see the history of present illness.   All other systems are personally reviewed and negative.    Exam:    Vital Signs:  BP 100/69   Pulse 95   Ht _0  (1.676 m)   Wt 190 lb (86.2 kg)   BMI 30.67 kg/m  *   Well appearing, alert and conversant, regular work of breathing,  good skin color Eyes- anicteric, neuro- grossly intact, skin- no apparent rash or lesions or cyanosis, mouth- oral mucosa is pink   Labs/Other Tests and Data Reviewed:    Recent Labs: 01/02/2019: ALT 18; BUN 16; Creatinine, Ser 1.00; Hemoglobin 13.2; Platelets 162; Potassium 4.5; Sodium 141   Wt Readings from Last 3 Encounters:  01/04/19 190 lb (86.2 kg)  01/02/19 206 lb 12.7 oz (93.8 kg)  08/12/18 206 lb 11.2 oz (93.8 kg)     Other studies personally reviewed:  **  Last device remote is reviewed from [place] PDF dated 2/20  which reveals normal device function, no arrhythmias  Carelink confirmed over weekend 2:1 Aflutter    ASSESSMENT & PLAN:     Atrial fibrillation/flutter  S/p  convergent ablation persistent  Prior Strokes  Pacemaker Medtronic MRI compatible   Non-Hodgkin's lymphoma on chemotherapy  Dyspnea on exertion     Syncope  Orthostatic LH  Treated hypothyroidism  High Risk Medication Surveillance   Long discussion re Orthostasis, mechanism and potential assoc with amiodarone.  For now will continue amio Will use abdominal binder and hopefully avoid pharmacoglogical agents for OI  Reviewed orthostatic maneuvers   On Anticoagulation;  No bleeding issues   Need to reassess TSH  Per  Dr Dorothea Ogle         COVID 19 screen The patient denies symptoms of COVID 19 at this time.  The importance of social distancing was discussed today.  Follow-up:  31mNext remote: As Scheduled   Current medicines are reviewed at length with the patient today.   The patient has concerns regarding her medicines.  The following changes were made today:  none  Labs/ tests ordered today include: none  As above per PCP  No orders of the defined types were placed in this encounter.     Patient Risk:  after full review of this patients clinical status, I feel that they are at moderate risk at this time.  Today, I have spent 29 minutes with the patient with telehealth technology discussing the above.  Signed, SVirl Axe MD  01/04/2019 11:05 AM  Kayla Harrison  [place] [address] [phone] (office) [phone] (fax)

## 2019-01-04 NOTE — Telephone Encounter (Signed)
Patient called stating she hasn't received the link for today's video visit at 10:30.

## 2019-01-05 IMAGING — CR DG CHEST 2V
2 series · 2 of 2 positions shown · non-contrast
Comparison: Chest CT May 27, 2017, chest x-ray February 03, 2017

CLINICAL DATA: Mediastinal B-cell lymphoma with increase shortness
of breath.

EXAM:
CHEST  2 VIEW

[w chest pa]
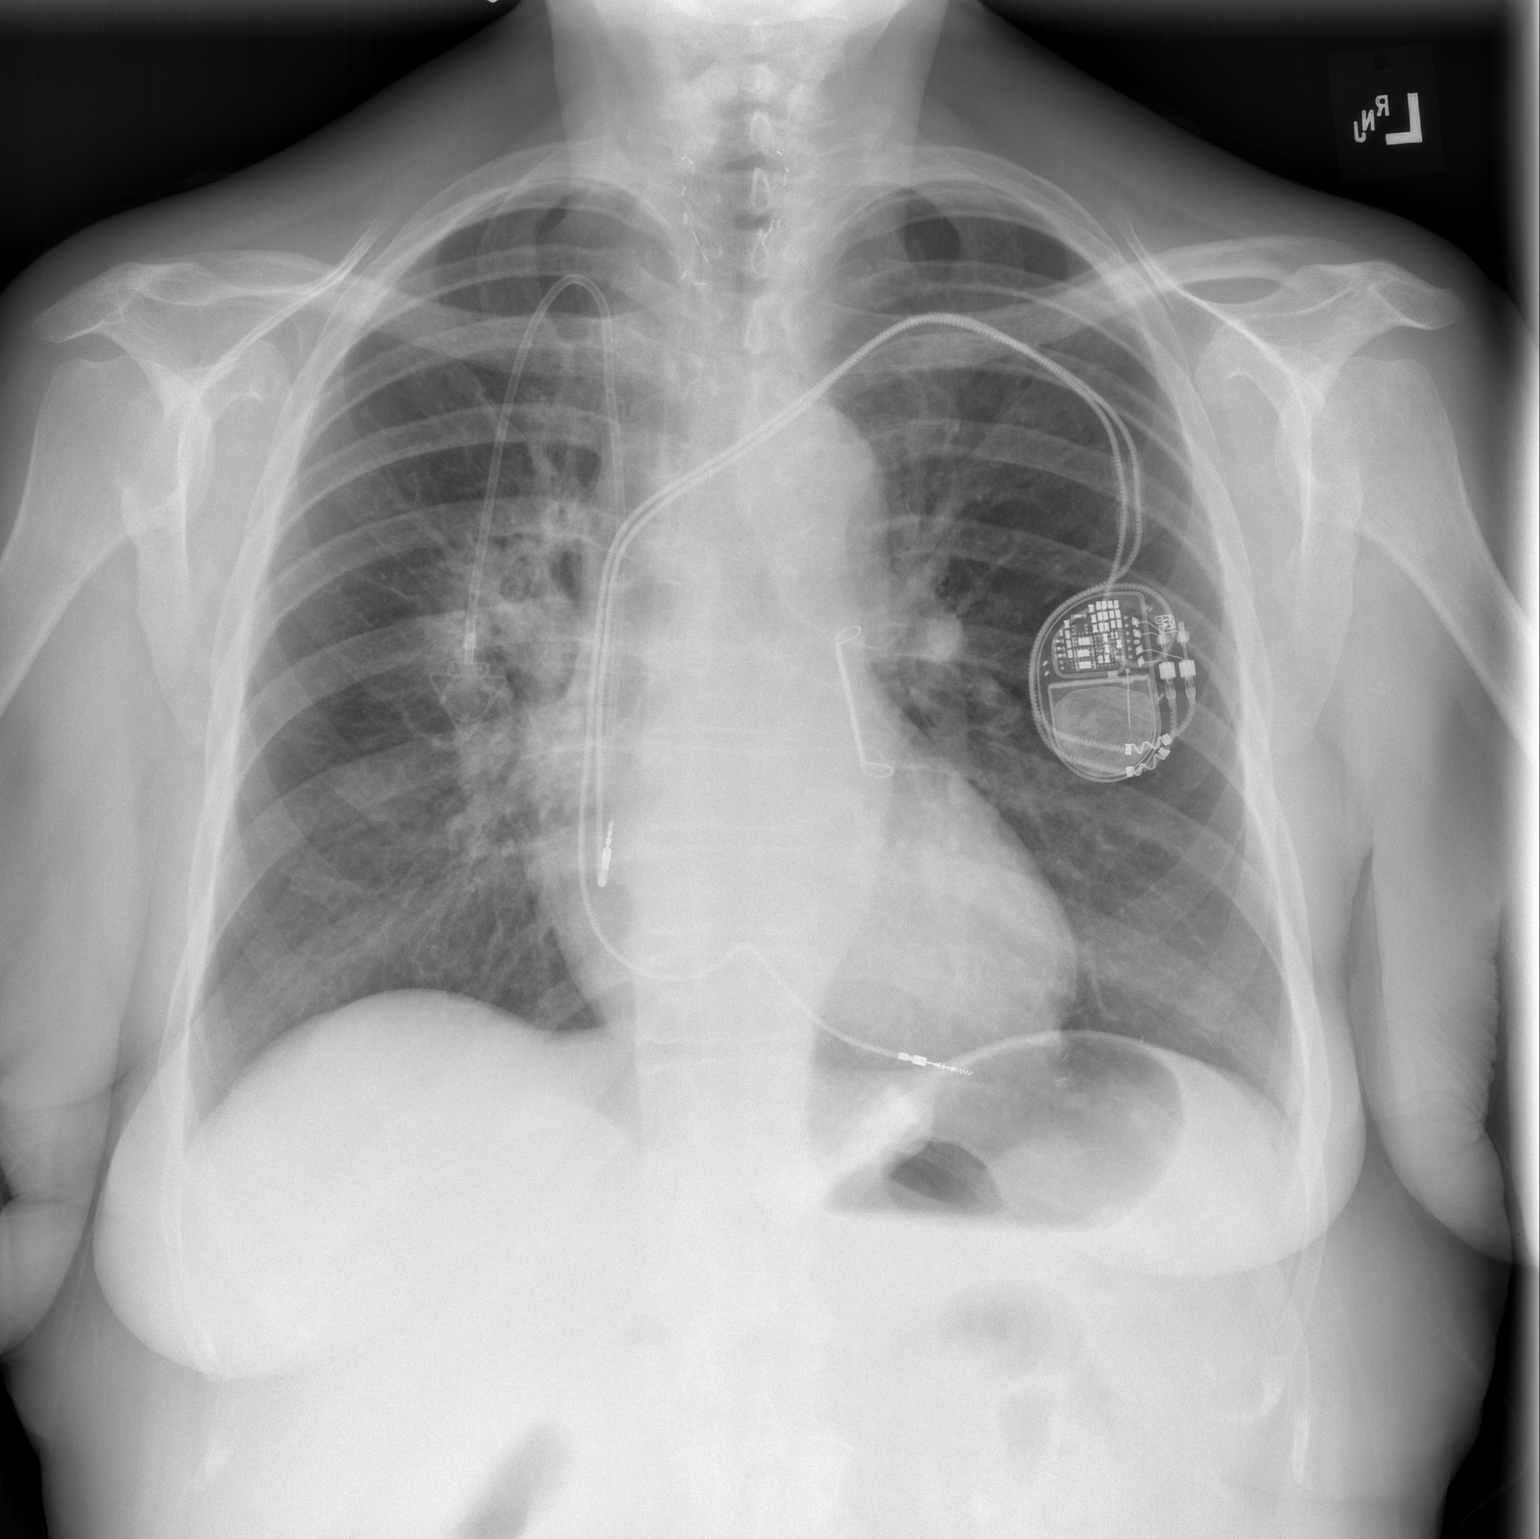

[w chest lat]
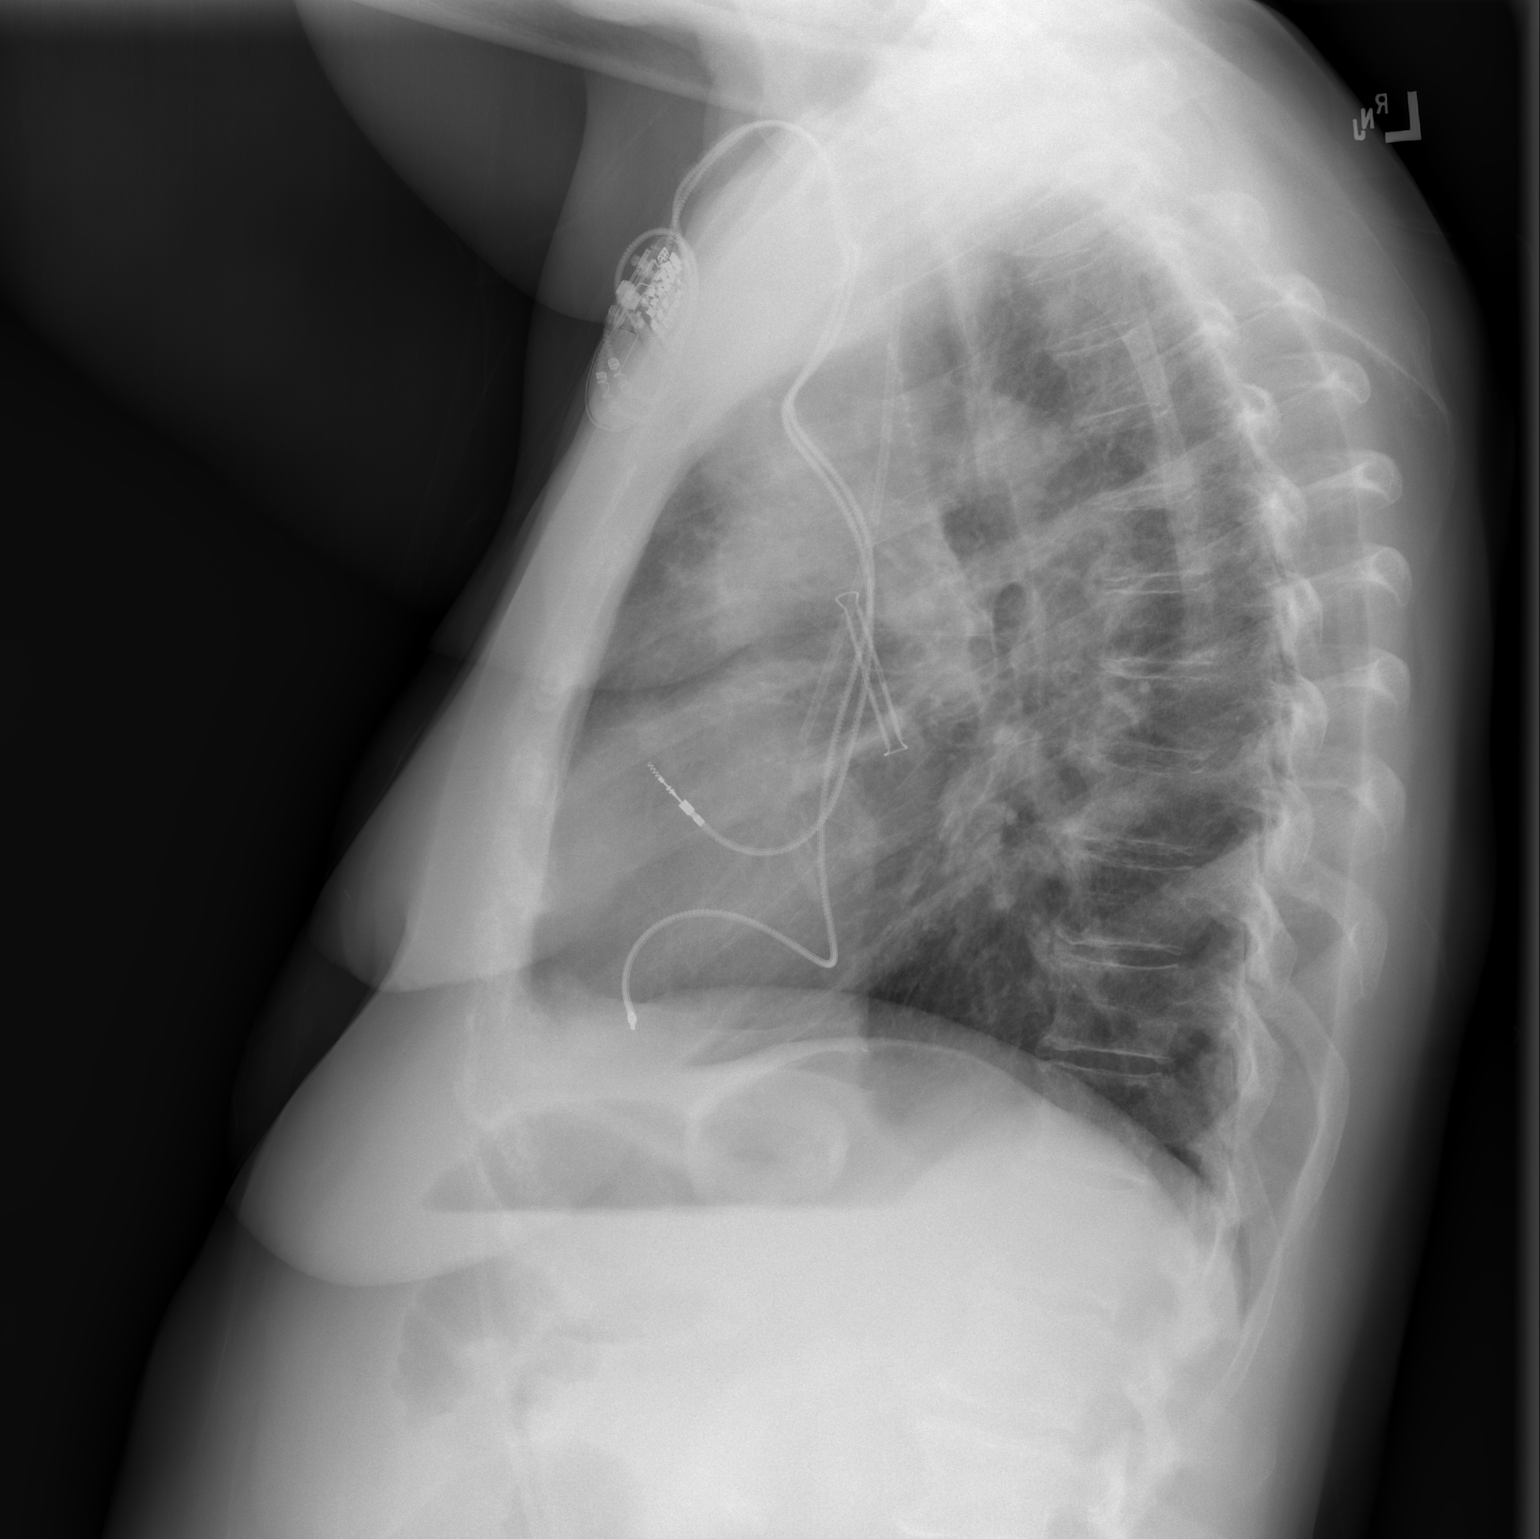

[2 of 2 positions shown; findings below may reference images not displayed]

FINDINGS: The heart size and mediastinal contours are stable. Dual lead
cardiac pacemaker is identified. Right central venous line is noted
with distal tip in the superior vena cava. Post radiation changes
identified in the right peritracheal and supra tracheal region
stable compared to prior CT chest. There is no pulmonary edema or
focal pneumonia. Degenerative joint changes of the spine are noted.
IMPRESSION: Postradiation changes identified in the right para and supra trachea
regions stable compared prior chest CT. No acute abnormality
identified.

## 2019-01-29 ENCOUNTER — Telehealth: Payer: Self-pay | Admitting: *Deleted

## 2019-01-29 NOTE — Telephone Encounter (Signed)
Called patient to reschedule CT that was canceled due to COVID restrictions pt wants to call Dr. Halford Chessman and see if she needs CT and will call back if he wants her to have CT.

## 2019-02-02 ENCOUNTER — Other Ambulatory Visit: Payer: Self-pay

## 2019-02-02 ENCOUNTER — Encounter: Payer: Medicare Other | Admitting: *Deleted

## 2019-02-03 ENCOUNTER — Telehealth: Payer: Self-pay

## 2019-02-03 NOTE — Telephone Encounter (Signed)
Spoke with patient to remind of missed remote transmission 

## 2019-02-12 DIAGNOSIS — E890 Postprocedural hypothyroidism: Secondary | ICD-10-CM | POA: Diagnosis not present

## 2019-02-16 DIAGNOSIS — E785 Hyperlipidemia, unspecified: Secondary | ICD-10-CM | POA: Diagnosis not present

## 2019-02-16 DIAGNOSIS — R918 Other nonspecific abnormal finding of lung field: Secondary | ICD-10-CM | POA: Diagnosis not present

## 2019-02-16 DIAGNOSIS — R269 Unspecified abnormalities of gait and mobility: Secondary | ICD-10-CM | POA: Diagnosis not present

## 2019-02-16 DIAGNOSIS — I480 Paroxysmal atrial fibrillation: Secondary | ICD-10-CM | POA: Diagnosis not present

## 2019-02-16 DIAGNOSIS — I779 Disorder of arteries and arterioles, unspecified: Secondary | ICD-10-CM | POA: Diagnosis not present

## 2019-02-16 DIAGNOSIS — N183 Chronic kidney disease, stage 3 (moderate): Secondary | ICD-10-CM | POA: Diagnosis not present

## 2019-02-16 DIAGNOSIS — C884 Extranodal marginal zone B-cell lymphoma of mucosa-associated lymphoid tissue [MALT-lymphoma]: Secondary | ICD-10-CM | POA: Diagnosis not present

## 2019-02-16 DIAGNOSIS — R7301 Impaired fasting glucose: Secondary | ICD-10-CM | POA: Diagnosis not present

## 2019-02-16 DIAGNOSIS — C73 Malignant neoplasm of thyroid gland: Secondary | ICD-10-CM | POA: Diagnosis not present

## 2019-02-16 DIAGNOSIS — M5416 Radiculopathy, lumbar region: Secondary | ICD-10-CM | POA: Diagnosis not present

## 2019-02-16 DIAGNOSIS — C50911 Malignant neoplasm of unspecified site of right female breast: Secondary | ICD-10-CM | POA: Diagnosis not present

## 2019-02-16 DIAGNOSIS — E890 Postprocedural hypothyroidism: Secondary | ICD-10-CM | POA: Diagnosis not present

## 2019-03-30 IMAGING — CT NM PET TUM IMG RESTAG (PS) SKULL BASE T - THIGH
1 of 8 series · 1 of 25 positions shown · non-contrast
Comparison: 02/19/2017 PET.  05/27/2017 diagnostic chest CT.

CLINICAL DATA: Subsequent treatment strategy for restaging of
lymphoma. Large B-cell lymphoma of the thymus. History of radiation
therapy for lung cancer in [REDACTED] of this year..

EXAM:
NUCLEAR MEDICINE PET SKULL BASE TO THIGH
TECHNIQUE: 10.5 mCi F-18 FDG was injected intravenously. Full-ring PET imaging
was performed from the skull base to thigh after the radiotracer. CT
data was obtained and used for attenuation correction and anatomic
localization.
FASTING BLOOD GLUCOSE:  Value: 72 mg/dl

[Series 3: pet sk_thigh ac · axial · 5.0mm · 4.07mm/px · 1 of 218 slices shown]
[im 109/218]
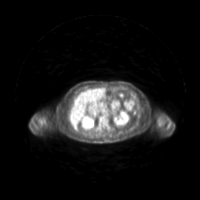

[1 of 25 positions shown; findings below may reference images not displayed]

FINDINGS: NECK: No areas of abnormal hypermetabolism. Left-sided carotid
atherosclerosis. No cervical adenopathy.

CHEST: Increased in paramediastinal pulmonary consolidation, likely
radiation induced. This corresponds to low-level hypermetabolism
which is diffuse. Measures on the order of a S.U.V. max of 4.2 in
the region of right paratracheal adenopathy/mass on the prior.
Example image 60/series 4. On the prior PET, dominant node in this
area measured 4.1 cm and a S.U.V. max of 9.1.

Atrial septal occlusion device. Pacer. Cardiomegaly. Coronary artery
atherosclerosis. Small hiatal hernia. Small right pleural effusion,
new since the prior diagnostic CT. Left lower lobe pulmonary nodule
is grossly similar at 7 mm on image 38/ series 8. Pulmonary artery
enlargement.

ABDOMEN/PELVIS: No intraabdominal/ pelvic hypermetabolism
identified. A subcutaneous left anterior pelvic wall 12 mm nodule
corresponds to hypermetabolism, measuring a S.U.V. max of 3.1 on
image 154/ series 4. This is new since the prior PET.

Status post lap band procedure. Left hepatic lobe cyst.
Cholecystectomy. Normal adrenal glands. Moderate right-sided
hydronephrosis secondary to a 1.7 cm right ureteral pelvic junction
stone. This has enlarged from 1.4 cm on the prior. Bilateral renal
collecting system calculi. The proximal right ureteric stone on the
prior exam has passed. There is a new stone at the left proximal
ureter which measures 12 mm on image 121/series 4. No significant
hydronephrosis.

Large amount of stool in the rectum.

SKELETON: No abnormal marrow activity. No focal osseous lesion.
IMPRESSION: 1. Response to therapy since the prior PET. Low-level mediastinal
and paramediastinal hypermetabolism is likely radiation induced. No
focal activity to suggest residual or recurrent disease in the
chest.
2. An anterior pelvic subcutaneous nodule is newly hypermetabolic.
Nonspecific. Consider physical exam correlation.
3. Right-sided hydronephrosis secondary to ureteropelvic junction
stone, similar. The stone has enlarged since the prior. New
left-sided proximal ureteric stone, without significant
hydronephrosis. A proximal right ureteric stone has passed since the
prior PET.
4.  Possible constipation.
5. Coronary artery atherosclerosis. Aortic Atherosclerosis
([id number]).
6. Pulmonary artery enlargement suggests pulmonary arterial
hypertension.
7. Small right pleural effusion.
8. Similar left lower lobe pulmonary nodule.

## 2019-03-31 ENCOUNTER — Encounter: Payer: Self-pay | Admitting: Internal Medicine

## 2019-03-31 ENCOUNTER — Other Ambulatory Visit: Payer: Self-pay

## 2019-03-31 ENCOUNTER — Telehealth: Payer: Self-pay | Admitting: Internal Medicine

## 2019-03-31 ENCOUNTER — Ambulatory Visit (INDEPENDENT_AMBULATORY_CARE_PROVIDER_SITE_OTHER): Payer: Medicare Other | Admitting: Internal Medicine

## 2019-03-31 VITALS — BP 122/86 | HR 75 | Ht 66.0 in | Wt 224.6 lb

## 2019-03-31 DIAGNOSIS — I442 Atrioventricular block, complete: Secondary | ICD-10-CM | POA: Diagnosis not present

## 2019-03-31 DIAGNOSIS — R05 Cough: Secondary | ICD-10-CM

## 2019-03-31 DIAGNOSIS — I495 Sick sinus syndrome: Secondary | ICD-10-CM | POA: Diagnosis not present

## 2019-03-31 DIAGNOSIS — Z950 Presence of cardiac pacemaker: Secondary | ICD-10-CM

## 2019-03-31 DIAGNOSIS — R059 Cough, unspecified: Secondary | ICD-10-CM

## 2019-03-31 DIAGNOSIS — I4819 Other persistent atrial fibrillation: Secondary | ICD-10-CM

## 2019-03-31 NOTE — Patient Instructions (Signed)
Medication Instructions:  Your physician recommends that you continue on your current medications as directed. Please refer to the Current Medication list given to you today.  Labwork: You will have labs drawn today: CBC, BMP, LFT, TSH  Testing/Procedures: Your physician has recommended that you have a pulmonary function test. Pulmonary Function Tests are a group of tests that measure how well air moves in and out of your lungs.  A chest x-ray takes a picture of the organs and structures inside the chest, including the heart, lungs, and blood vessels. This test can show several things, including, whether the heart is enlarges; whether fluid is building up in the lungs; and whether pacemaker / defibrillator leads are still in place.  Non-Cardiac CT scanning, (CAT scanning), is a noninvasive, special x-ray that produces cross-sectional images of the body using x-rays and a computer. CT scans help physicians diagnose and treat medical conditions. For some CT exams, a contrast material is used to enhance visibility in the area of the body being studied. CT scans provide greater clarity and reveal more details than regular x-ray exams.   Follow-Up: Your physician recommends that you schedule a follow-up appointment in:   3 months with Dr. Caryl Comes  Any Other Special Instructions Will Be Listed Below (If Applicable).     If you need a refill on your cardiac medications before your next appointment, please call your pharmacy.

## 2019-03-31 NOTE — Telephone Encounter (Signed)
New Message ° ° ° ° °  ° ° °COVID-19 Pre-Screening Questions: ° °• In the past 7 to 10 days have you had a cough,  shortness of breath, headache, congestion, fever (100 or greater) body aches, chills, sore throat, or sudden loss of taste or sense of smell? °NO °• Have you been around anyone with known Covid 19. °NO °• Have you been around anyone who is awaiting Covid 19 test results in the past 7 to 10 days? °NO °• Have you been around anyone who has been exposed to Covid 19, or has mentioned symptoms of Covid 19 within the past 7 to 10 days? °NO ° °If you have any concerns/questions about symptoms patients report during screening (either on the phone or at threshold). Contact the provider seeing the patient or DOD for further guidance.  If neither are available contact a member of the leadership team. ° ° ° °   ° ° ° ° ° °

## 2019-03-31 NOTE — Progress Notes (Signed)
Patient Care Team: [name], MD as PCP - General (Endocrinology) Deboraha Sprang, MD as PCP - Cardiology (Cardiology)   HPI  Kayla Harrison is a 72 y.o. female Seen in followup for a pacemaker implanted at [facility] and atrial fibrillation.  She underwent pulmonary vein isolation at [facility] and had recurrent atrial fibrillation and underwent convergent ablation at [facility] 8/84; it was complicated by heart block prompting pacemaker implantation.   5/18 presented w SOB and facial fullness, found to have SVC syndrome 2/2 NHlymphoma Rx initially with XRT  Now in remission  She has had recurrent aFib and  was restarted on amio--and then stopped 2/2 worsening SOB  She has a history of prior strokes and remains on oral anticoagulation . She is on apixoban and tolerating well.    Hospitalized 12/18 for wide-complex tachycardia which was thought to be related to intermittent pacing because of atrial flutter with blanking Reprogrammed DDIR.  Amiodarone resumed   Had B renal calculi Rx 2/19 w nephrostomy and stenting.  She was glad to get both sides done at the same time.  She is back to exercising.  She has had scant palpitations.  She is tolerating the amiodarone quite well.  There is no cough or nausea.  Shortness of breath is at baseline.  No edema.  Episodes of lightheadedness.  She fell and hit her head and back.  She called me at home in April.  She was referred to the ER.  CT was negative.  She underwent cardioversion.  Much improved thereafter.  Residual orthostatic intolerance.  Has been on amiodarone for a long time.  Suggested using an abdominal binder and avoid pharmacological therapies.  no improvement in orthostatics wth abd binder    Date TSH ALT Hgb  8/18 0.2 15 11.9  9/18                     9.3  3/19 0.722 (12/18) 23 11.2  4/20  18 13.2      Very SOB, with orthopnea but no PND edema.  No chest pain.  Venous discoloration with prolonged standing.  Unable to  stand for more than 10-15 minutes without getting very weak.  Difficulty with showers.  Has to rest prior to dressing.    Recurrent palpitations.  Repeat interrogations have been uninformative.  Weight is up 25 pounds since November.    DATE TEST    2011 Echo    EF 55 % LAE 40 mm  7/14 Echo    EF 55-60 %   9/18 Echo  EF 55-60%   12/18 Echo  EF 55-60% Severe LAE ?      She also has a history of morbid obesity status post lap band surgery    Thromboembolic risk factors ( age -24 , TIA/CVA-2, Gender-1) for a CHADSVASc Score of 4   Past Medical History:  Diagnosis Date  . Anemia   . Arthritis    osteoarthritis - knees and right shoulder  . Atrial fibrillation [facility])    ablation- 2x's-- 1st time- [facility], 2nd event at [facility] in 2008. Convergent ablation at [facility] 6/14  . Atrial fibrillation ([place])   . Blood transfusion without reported diagnosis   . Breast cancer ([place])    Dr Margot Chimes, total thyroidectomy- 1999- for cancer  . 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 Chronic bilateral pleural effusions   . Colon polyp    Dr Earlean Shawl  . Complete heart block ([place])   .  Complication of anesthesia    Ketamine produces LSD reaction, bright colored nightmarish experience   . Dyslipidemia   . Dyspnea   . Endometriosis   . Fibroids   . H/O pleural effusion    s/p thoracentesis w 3226m withdrawn  . Hepatitis    [place] ?hepatitis   . History of dysphagia    due to radiation therapy  . History of hiatal hernia    small noted on PET scan  . History of kidney stones   . Hx of thyroid cancer    Dr SForde Dandy . Hyperlipidemia   . Hypertension   . Hypothyroidism   . Lung cancer, [place] lobe [place]   radiation RX completed 03/04/17; will start chemo 6/27, pt unaware of lung cancer  . Morbid obesity ([place]    Status post lap band surgery  . Nephrolithiasis   . Non Hodgkin's lymphoma ([place]    on chemotherapy  . Persistent atrial fibrillation    a. s/p PVI 2008 b. s/p  convergent ablation 25465complicated by bradycardia requiring pacemaker implant  . Personal history of radiation therapy   . Presence of permanent cardiac pacemaker   . Rotator cuff tear    Right  . Sinus node dysfunction ([place]    Complicating convergent ablation 6/14  . St[place] ([facility]    [place]  . SVC syndrome    with lung mass and non hodgkins lymphoma  . Thyroid cancer [place]    Past Surgical History:  Procedure Laterality Date  . ABDOMINAL HYSTERECTOMY  1983  . afib ablation     a. 2008 PVI b. 2014 convergent ablation  . APPENDECTOMY    . BONE MARROW BIOPSY  02/21/2017  . BREAST LUMPECTOMY Left 2010  . bso  1998  . CARDIAC CATHETERIZATION     2015- negative  . CARDIOVERSION  10/09/2012   Procedure: CARDIOVERSION;  Surgeon: JMinus Breeding MD;  [place]  Service: Cardiovascular;  Laterality: N/A;  . CARDIOVERSION  10/09/2012   Procedure: CARDIOVERSION;  Surgeon: JMinus Breeding MD;  [facility];  Service: Cardiovascular;  Laterality: N/A;  [name] the ok to add pt to the add on , but we must check to find out if the can add pt on at [phone] ( [phone])  . CARDIOVERSION N/A 11/20/2012   Procedure: CARDIOVERSION;  Surgeon: PFay Records MD;  [facility]  Service: Cardiovascular;  Laterality: N/A;  . CARDIOVERSION N/A 07/18/2017   Procedure: CARDIOVERSION;  Surgeon: NThayer Headings MD;  [facility]  Service: Cardiovascular;  Laterality: N/A;  . CARDIOVERSION N/A 10/03/2017   Procedure: CARDIOVERSION;  Surgeon: CSanda  MD;  [facility]  Service: Cardiovascular;  Laterality: N/A;  . CARDIOVERSION N/A 01/07/2018   Procedure: CARDIOVERSION;  Surgeon: NAcie FredricksonPWonda Cheng MD;  [place]  Service: Cardiovascular;  Laterality: N/A;  . CHOLECYSTECTOMY    . COLONOSCOPY W/ POLYPECTOMY     Dr MEarlean Shawl . CYSTOSCOPY N/A 02/06/2015   Procedure: CYSTOSCOPY;  Surgeon: MKathie Rhodes MD;  [facility];  Service:  Urology;  Laterality: N/A;  . CYSTOSCOPY W/ RETROGRADES Left 11/17/2017   Procedure: CYSTOSCOPY WITH RETROGRADE /PYELOGRAM/;  Surgeon: OKathie Rhodes MD;  [facility];  Service: Urology;  Laterality: Left;  . CYSTOSCOPY WITH RETROGRADE PYELOGRAM, URETEROSCOPY AND STENT PLACEMENT Right 02/06/2015   Procedure: RETROGRADE PYELOGRAM, RIGHT URETEROSCOPY STENT PLACEMENT;  Surgeon: MKathie Rhodes MD;  [facility];  Service: Urology;  Laterality: Right;  . CYSTOSCOPY WITH RETROGRADE PYELOGRAM, URETEROSCOPY AND STENT PLACEMENT Right 03/07/2017   Procedure: CYSTOSCOPY WITH RIGHT RETROGRADE PYELOGRAM,RIGHT  URETEROSCOPYLASER LITHOTRIPSY  AND STENT PLACEMENT AND STONE BASKETRY;  Surgeon: Kathie Rhodes, MD;  [place];  Service: Urology;  Laterality: Right;  . EYE SURGERY     cataract surgery  . fatty mass removal  1999   pubic area  . HOLMIUM LASER APPLICATION N/A 7/49/4496   Procedure: HOLMIUM LASER APPLICATION;  Surgeon: Kathie Rhodes, MD;  [facility];  Service: Urology;  Laterality: N/A;  . HOLMIUM LASER APPLICATION Right 7/59/1638   Procedure: HOLMIUM LASER APPLICATION;  Surgeon: Kathie Rhodes, MD;  [facility];  Service: Urology;  Laterality: Right;  . HOLMIUM LASER APPLICATION Left 4/66/5993   Procedure: HOLMIUM LASER APPLICATION;  Surgeon: Kathie Rhodes, MD;  [facility];  Service: Urology;  Laterality: Left;  . IR FLUORO GUIDE PORT INSERTION RIGHT  02/24/2017  . IR NEPHROSTOMY PLACEMENT RIGHT  11/17/2017  . IR PATIENT EVAL TECH 0-60 MINS  03/11/2017  . IR REMOVAL TUN ACCESS W/ PORT W/O FL MOD SED  04/20/2018  . IR US GUIDE VASC ACCESS RIGHT  02/24/2017  . KNEE ARTHROSCOPY     bilateral  . LAPAROSCOPIC GASTRIC BANDING  07/10/2010  . LAPAROSCOPIC GASTRIC BANDING     Laparoscopic adjustable banding APS System with posterior hiatal hernia, 2 suture.  Marland Kitchen LAPAROTOMY     for ruptured ovary and ovarian artery   . NEPHROLITHOTOMY Right  11/17/2017   Procedure: NEPHROLITHOTOMY PERCUTANEOUS;  Surgeon: Kathie Rhodes, MD;  [facility];  Service: Urology;  Laterality: Right;  . PACEMAKER INSERTION  03/10/2013   MDT dual chamber PPM  . POCKET REVISION N/A 12/08/2013   Procedure: POCKET REVISION;  Surgeon: Deboraha Sprang, MD;  Location: [facility] C[facility];  Service: Cardiovascular;  Laterality: N/A;  . PORTA CATH INSERTION    . REVERSE SHOULDER ARTHROPLASTY Right 05/14/2018   Procedure: RIGHT REVERSE SHOULDER ARTHROPLASTY;  Surgeon: Tania Ade, MD;  Location: [place];  Service: Orthopedics;  Laterality: Right;  . REVERSE SHOULDER REPLACEMENT Right 05/14/2018  . TEE WITH CARDIOVERSION  09/22/2017  . TEE WITHOUT CARDIOVERSION N/A 10/03/2017   Procedure: TRANSESOPHAGEAL ECHOCARDIOGRAM (TEE);  Surgeon: Sanda , MD;  Location: [place];  Service: Cardiovascular;  Laterality: N/A;  . THYROIDECTOMY  1998   Dr Margot Chimes  . TONSILLECTOMY    . TOTAL KNEE ARTHROPLASTY  04/13/2012   Procedure: TOTAL KNEE ARTHROPLASTY;  Surgeon: Rudean Haskell, MD;  Location: [place];  Service: Orthopedics;  Laterality: Right;  Marland Kitchen VIDEO BRONCHOSCOPY WITH ENDOBRONCHIAL ULTRASOUND N/A 02/07/2017   Procedure: VIDEO BRONCHOSCOPY WITH ENDOBRONCHIAL ULTRASOUND;  Surgeon: Marshell Garfinkel, MD;  Location: [place];  Service: Pulmonary;  Laterality: N/A;    Current Outpatient Medications  Medication Sig Dispense Refill  . amiodarone (PACERONE) 200 MG tablet Take 1 tablet (200 mg total) by mouth daily. 90 tablet 3  . apixaban (ELIQUIS) 5 MG TABS tablet Take 1 tablet (5 mg total) by mouth 2 (two) times daily. 180 tablet 2  . levothyroxine (SYNTHROID, LEVOTHROID) 175 MCG tablet Take 175 mcg by mouth See admin instructions. TAKE 1 TABLET (175 MCG) BY MOUTH DAILY, EXCEPT FRIDAYS.    Marland Kitchen lidocaine (LIDODERM) 5 % Place 1 patch onto the skin daily. Remove & Discard patch within 12 hours or as directed by MD 15 patch 0  . Lifitegrast (XIIDRA) 5 % SOLN Apply 1 drop to  eye at  bedtime.     . Multiple Vitamin (MULTIVITAMIN) tablet Take 1 tablet by mouth daily.     . rosuvastatin (CRESTOR) 10 MG tablet Take 1 tablet by mouth 2 (two) times a week. Takes on Monday and Thursday    . traMADol (ULTRAM) 50 MG tablet Take 1 tablet (50 mg total) by mouth every 6 (six) hours as needed. 15 tablet 0   No current facility-administered medications for this visit.     Allergies  Allergen Reactions  . Dofetilide Other (See Comments)    Prolonged QT interval   . Rivaroxaban Other (See Comments)    Nose Bleed X 6 hrs ; packing in ER Nasal hemorrhage  . Epinephrine Rash and Other (See Comments)    Oral anesthetic Dental form only (liquid). Patient stated she will become "out of it" she can hear you but cannot respond in normal fashion. "not fully with it"  . Adhesive [Tape] Rash    Paper tape as well  . Ketamine Other (See Comments)    HALLUCINATIONS    Review of Systems negative except from HPI and PMH  Physical Exam BP 122/86 (BP Location: Right Arm, Cuff Size: Large)   Pulse 75   Ht 5' 6" (1.676 m)   Wt 224 lb 9.6 oz (101.9 kg)   SpO2 97%   BMI 36.25 kg/m  Well developed and nourished in no acute distress HENT normal Neck supple with JVP-6-7 cm Clear Regular rate and rhythm, no murmurs or gallops Abd-soft with active BS No Clubbing cyanosis 1-2+ edema Skin-warm and dry A & Oriented  Grossly normal sensory and motor function    ECG atrial pacing at 76 Interval 16/09/43 Otherwise normal  Previously symptoms have been associated with junctional rhythm   Assessment and  Plan  Atrial fibrillation/flutter  S/p convergent ablation persistent  Prior Strokes  Pacemaker Medtronic MRI compatible   Non-Hodgkin's lymphoma on chemotherapy  Dyspnea on exertion     Treated hypothyroidism  High Risk Medication Surveillance  Junctional rhythm  Symptomatic palpitations question mechanism  Orthostatic hypotension    The patient has recurrent  symptoms with pacing.  Atrial preference pacing had been programmed last time because of symptoms with junctional rhythm.  No other symptoms with ventricular pacing and I wonder if there is not some degree of RNRAVS   With her symptoms of shortness of breath and orthopnea, modest edema and not withstanding near normal JVP, will check a BNP.  If it is elevated would anticipate a little bit more aggressive diuresis; however, we worry about his aggravation of her orthostatic intolerance.  Unfortunately nonpharmacological treatment with an abdominal binder was of little benefit.  Obtain a chest CT scan to look for consequences of radiation therapy into a recurrent tumor.  Her cough is concerning but it could be heart failure.  Amiodarone could be contributing to much of this.  We will wait until we get the aforementioned results but we may have to discontinue the amiodarone as it may be causing toxicity.  We will schedule PFTs with [facility].   More than 50% of 40 min was spent in counseling related to the above

## 2019-04-01 LAB — BASIC METABOLIC PANEL
BUN/Creatinine Ratio: 20 (ref 12–28)
BUN: 22 mg/dL (ref 8–27)
CO2: 22 mmol/L (ref 20–29)
Calcium: 9.3 mg/dL (ref 8.7–10.3)
Chloride: 102 mmol/L (ref 96–106)
Creatinine, Ser: 1.1 mg/dL — ABNORMAL HIGH (ref 0.57–1.00)
GFR calc Af Amer: 57 mL/min/{1.73_m2} — ABNORMAL LOW (ref >59)
GFR calc non Af Amer: 50 mL/min/{1.73_m2} — ABNORMAL LOW (ref >59)
Glucose: 104 mg/dL — ABNORMAL HIGH (ref 65–99)
Potassium: 4.9 mmol/L (ref 3.5–5.2)
Sodium: 142 mmol/L (ref 134–144)

## 2019-04-01 LAB — CBC
Hematocrit: 41.4 % (ref 34.0–46.6)
Hemoglobin: 13.7 g/dL (ref 11.1–15.9)
MCH: 30.7 pg (ref 26.6–33.0)
MCHC: 33.1 g/dL (ref 31.5–35.7)
MCV: 93 fL (ref 79–97)
Platelets: 193 10*3/uL (ref 150–450)
RBC: 4.46 x10E6/uL (ref 3.77–5.28)
RDW: 12.7 % (ref 11.7–15.4)
WBC: 6.2 10*3/uL (ref 3.4–10.8)

## 2019-04-01 LAB — HEPATIC FUNCTION PANEL
ALT: 17 IU/L (ref 0–32)
AST: 19 IU/L (ref 0–40)
Albumin: 4.5 g/dL (ref 3.7–4.7)
Alkaline Phosphatase: 94 IU/L (ref 39–117)
Bilirubin Total: 0.4 mg/dL (ref 0.0–1.2)
Bilirubin, Direct: 0.14 mg/dL (ref 0.00–0.40)
Total Protein: 6.6 g/dL (ref 6.0–8.5)

## 2019-04-01 LAB — TSH: TSH: 0.748 u[IU]/mL (ref 0.450–4.500)

## 2019-04-02 ENCOUNTER — Ambulatory Visit (HOSPITAL_COMMUNITY)
Admission: RE | Admit: 2019-04-02 | Discharge: 2019-04-02 | Disposition: A | Discharge status: Home / Self Care | Payer: Medicare Other | Source: Ambulatory Visit | Attending: Internal Medicine | Admitting: Internal Medicine

## 2019-04-02 ENCOUNTER — Other Ambulatory Visit: Payer: Self-pay

## 2019-04-02 ENCOUNTER — Encounter (HOSPITAL_COMMUNITY): Payer: Self-pay

## 2019-04-02 DIAGNOSIS — I442 Atrioventricular block, complete: Secondary | ICD-10-CM

## 2019-04-02 DIAGNOSIS — R05 Cough: Secondary | ICD-10-CM

## 2019-04-02 DIAGNOSIS — I495 Sick sinus syndrome: Secondary | ICD-10-CM | POA: Insufficient documentation

## 2019-04-02 DIAGNOSIS — Z950 Presence of cardiac pacemaker: Secondary | ICD-10-CM | POA: Diagnosis not present

## 2019-04-02 DIAGNOSIS — R0602 Shortness of breath: Secondary | ICD-10-CM | POA: Diagnosis not present

## 2019-04-02 DIAGNOSIS — I4819 Other persistent atrial fibrillation: Secondary | ICD-10-CM

## 2019-04-02 DIAGNOSIS — J90 Pleural effusion, not elsewhere classified: Secondary | ICD-10-CM | POA: Diagnosis not present

## 2019-04-02 DIAGNOSIS — R918 Other nonspecific abnormal finding of lung field: Secondary | ICD-10-CM | POA: Diagnosis not present

## 2019-04-02 DIAGNOSIS — R059 Cough, unspecified: Secondary | ICD-10-CM

## 2019-04-02 DIAGNOSIS — Z8579 Personal history of other malignant neoplasms of lymphoid, hematopoietic and related tissues: Secondary | ICD-10-CM | POA: Diagnosis not present

## 2019-04-02 MED ORDER — IOHEXOL 300 MG/ML  SOLN
75.0000 mL | Freq: Once | INTRAMUSCULAR | Status: AC | PRN
  Administered 2019-04-02: 75 mL via INTRAVENOUS

## 2019-04-02 MED ORDER — SODIUM CHLORIDE (PF) 0.9 % IJ SOLN
INTRAMUSCULAR | Status: AC
  Filled 2019-04-02: qty 50

## 2019-04-08 LAB — CUP PACEART INCLINIC DEVICE CHECK
Battery Remaining Longevity: 50 mo
Battery Voltage: 2.99 V
Brady Statistic AP VP Percent: 18.15 %
Brady Statistic AP VS Percent: 78.26 %
Brady Statistic AS VP Percent: 0.1 %
Brady Statistic AS VS Percent: 3.48 %
Brady Statistic RA Percent Paced: 95.78 %
Brady Statistic RV Percent Paced: 18.02 %
Date Time Interrogation Session: 20200708191310
Implantable Lead Implant Date: 20140618
Implantable Lead Implant Date: 20140618
Implantable Lead Location: 753859
Implantable Lead Location: 753860
Implantable Pulse Generator Implant Date: 20140618
Lead Channel Impedance Value: 361 Ohm
Lead Channel Impedance Value: 456 Ohm
Lead Channel Impedance Value: 931 Ohm
Lead Channel Impedance Value: 988 Ohm
Lead Channel Pacing Threshold Amplitude: 0.75 V
Lead Channel Pacing Threshold Amplitude: 0.875 V
Lead Channel Pacing Threshold Pulse Width: 0.4 ms
Lead Channel Pacing Threshold Pulse Width: 0.4 ms
Lead Channel Sensing Intrinsic Amplitude: 0.375 mV
Lead Channel Sensing Intrinsic Amplitude: 0.5 mV
Lead Channel Sensing Intrinsic Amplitude: 16.75 mV
Lead Channel Sensing Intrinsic Amplitude: 9.25 mV
Lead Channel Setting Pacing Amplitude: 2 V
Lead Channel Setting Pacing Amplitude: 2.5 V
Lead Channel Setting Pacing Pulse Width: 0.4 ms
Lead Channel Setting Sensing Sensitivity: 0.9 mV

## 2019-04-18 IMAGING — DX DG CHEST 1V PORT
1 series · 1 of 1 positions shown · non-contrast
Comparison: PET-CT August 27, 2017; chest radiograph June 04, 2017

CLINICAL DATA: Shortness of Breath.  B-cell lymphoma

EXAM:
PORTABLE CHEST 1 VIEW

[chest ap]
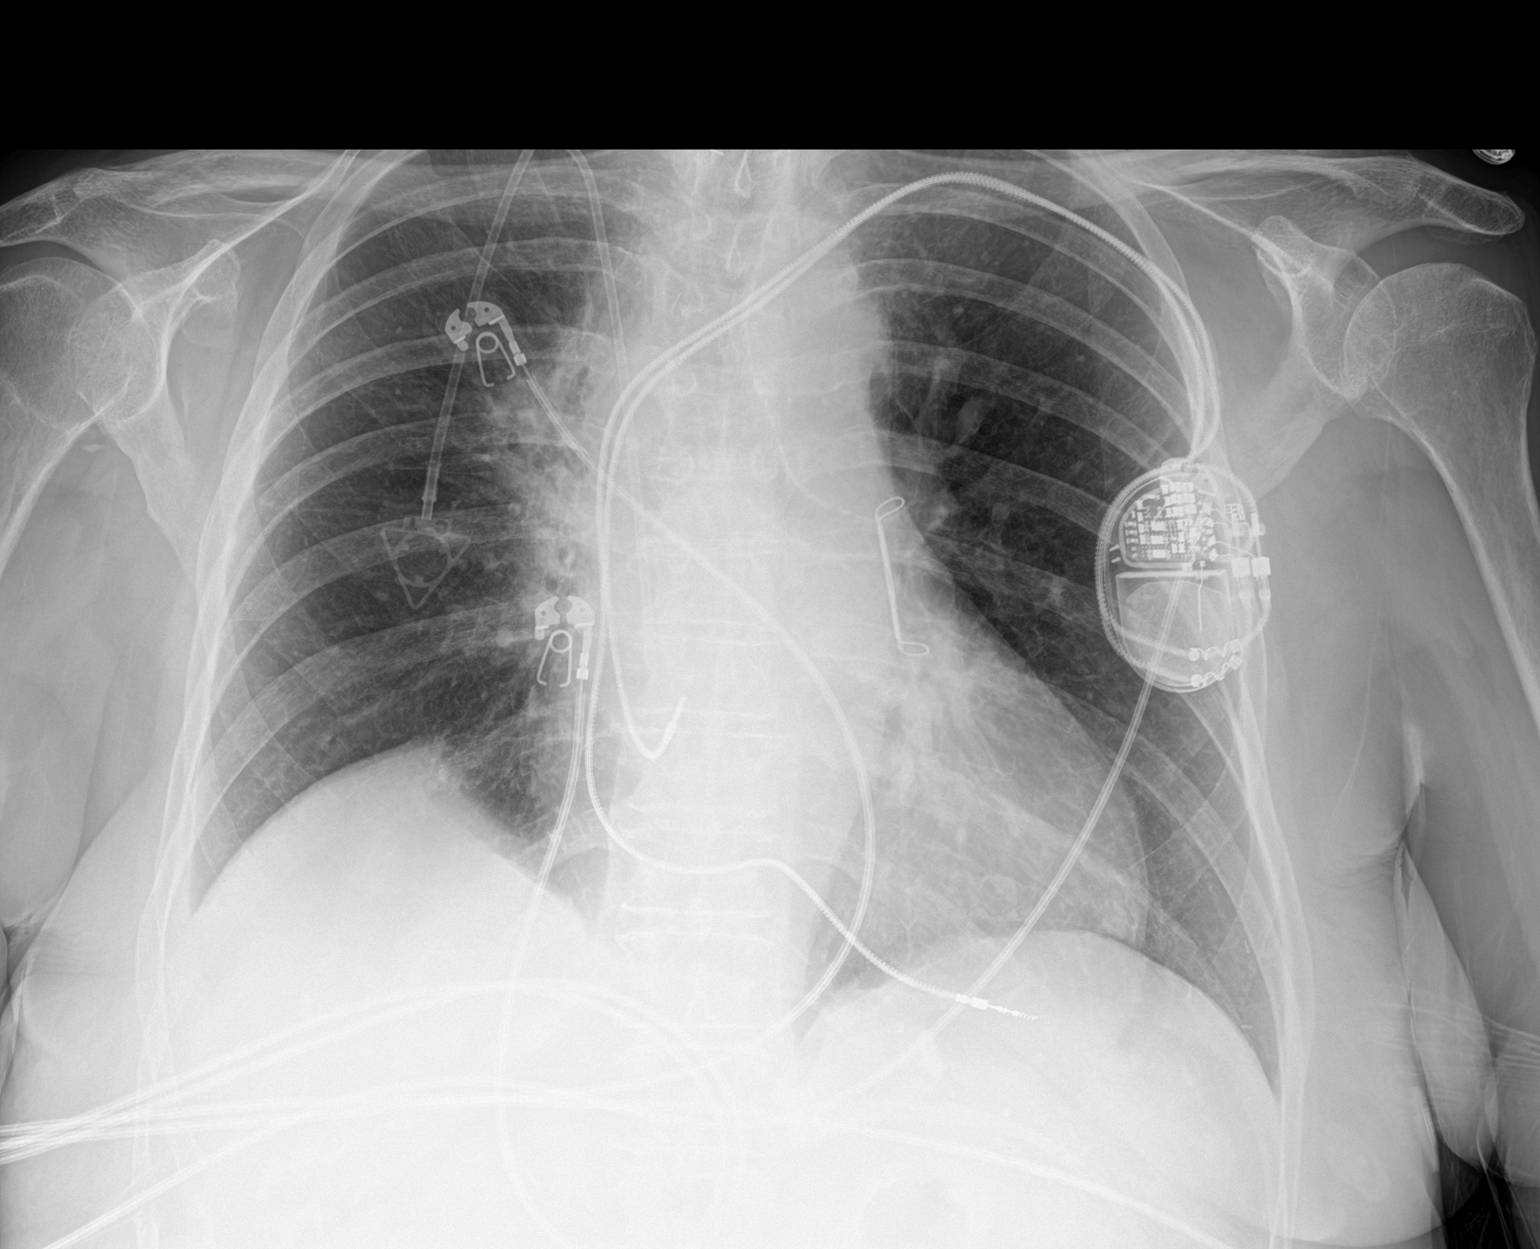

[1 of 1 positions shown; findings below may reference images not displayed]

FINDINGS: There is scarring with presumed radiation fibrosis in the right
medial thorax comfortably the right paratracheal region. There is no
edema or consolidation. There is a degree of volume loss on the
right. Heart size and pulmonary vascularity are normal. No
adenopathy is appreciable by radiography.

Port-A-Cath tip is in the superior vena cava. Pacemaker leads are
attached to the right atrium and right ventricle. There is a left
atrial appendage clamp.

There is postoperative change in the upper thoracic region in the
midline. No bone lesions.
IMPRESSION: Radiation fibrosis right peritracheal region. There is volume loss
on the right. There is no edema or consolidation. Stable cardiac
silhouette. No adenopathy demonstrable. No pneumothorax.

## 2019-04-18 IMAGING — CT CT ANGIO CHEST
2 of 6 series · 18 of 36 positions shown · IV contrast (Omni 300)
Comparison: CT scan of May 27, 2017. PET scan of August 27, 2017.

CLINICAL DATA: Difficulty breathing.

EXAM:
CT ANGIOGRAPHY CHEST WITH CONTRAST
TECHNIQUE: Multidetector CT imaging of the chest was performed using the
standard protocol during bolus administration of intravenous
contrast. Multiplanar CT image reconstructions and MIPs were
obtained to evaluate the vascular anatomy.
CONTRAST:  100mL LYHYZP-L4P IOPAMIDOL (LYHYZP-L4P) INJECTION 76%

[Series 7: pe thins · axial · 0.70mm/px · z∈[+1074,+1330]mm · 17 of 289 slices shown]
[im 17/289  lung]
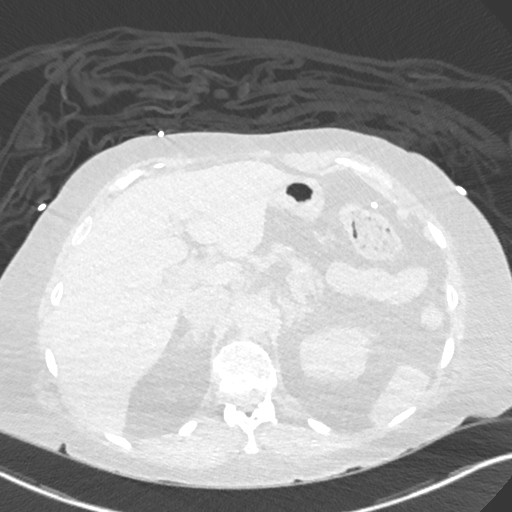
[im 33/289  mediastinal]
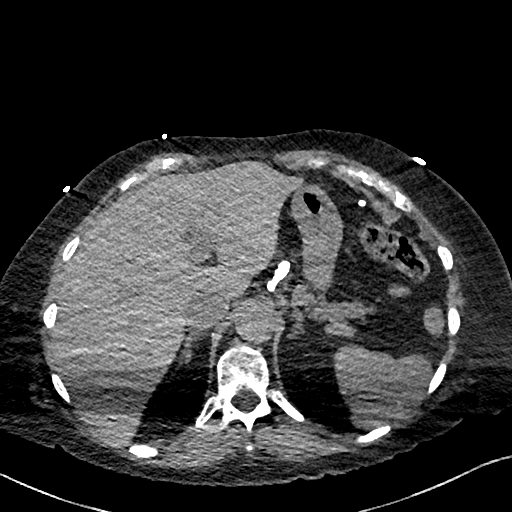
[im 49/289  lung]
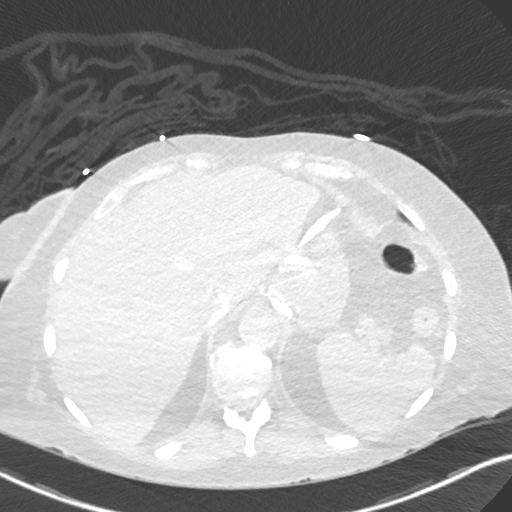
[im 65/289  mediastinal]
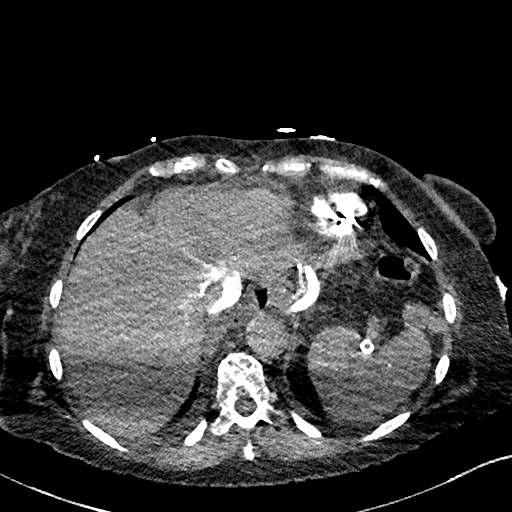
[im 81/289  lung]
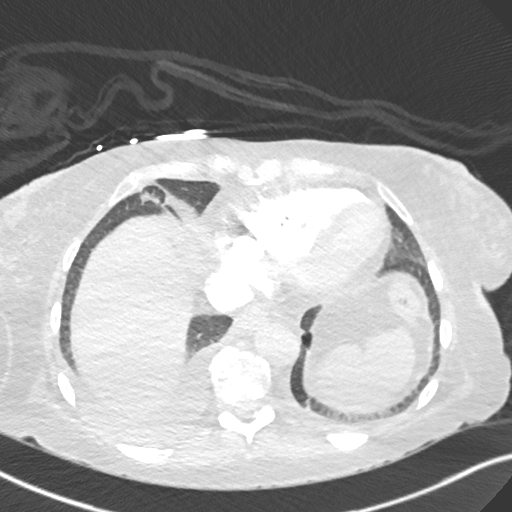
[im 97/289  mediastinal]
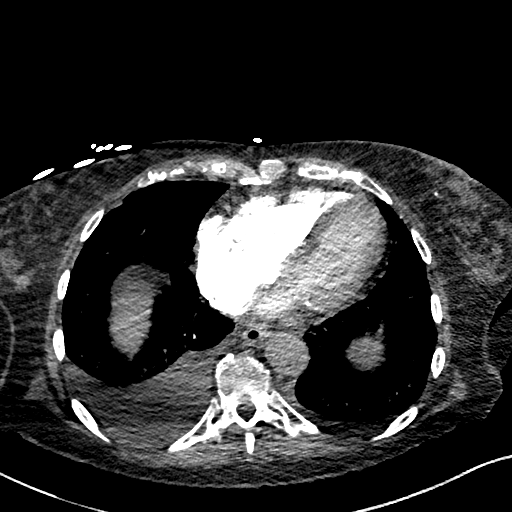
[im 113/289  lung]
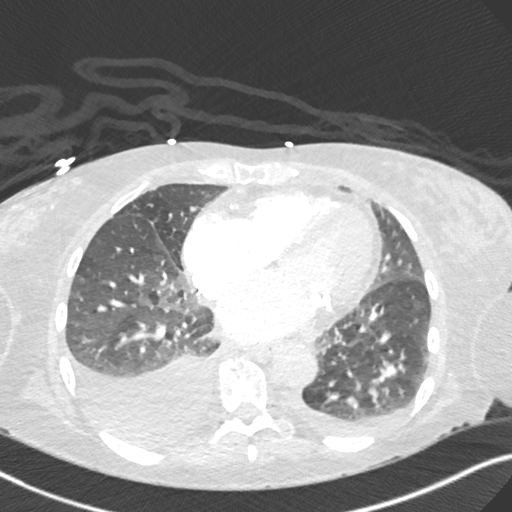
[im 129/289  mediastinal]
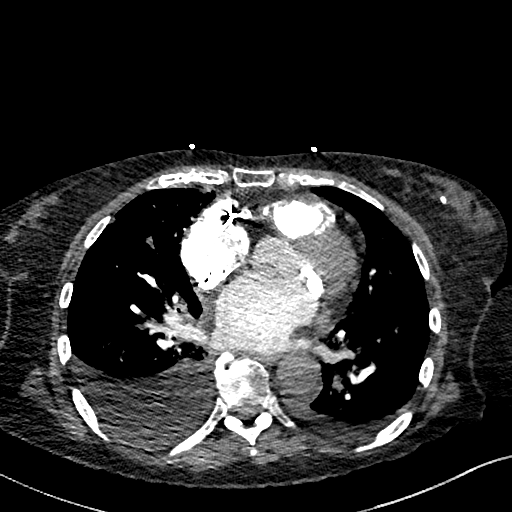
[im 145/289  lung]
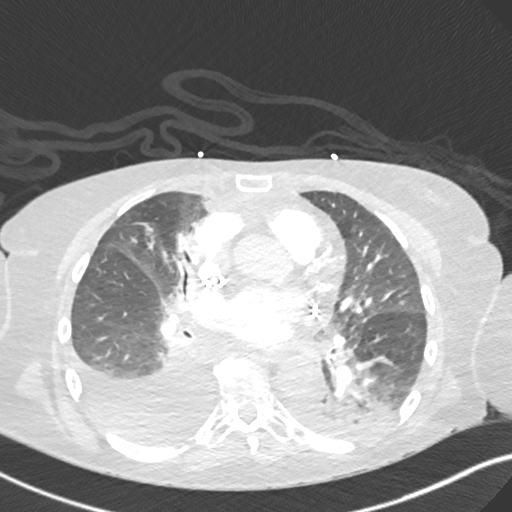
[im 161/289  mediastinal]
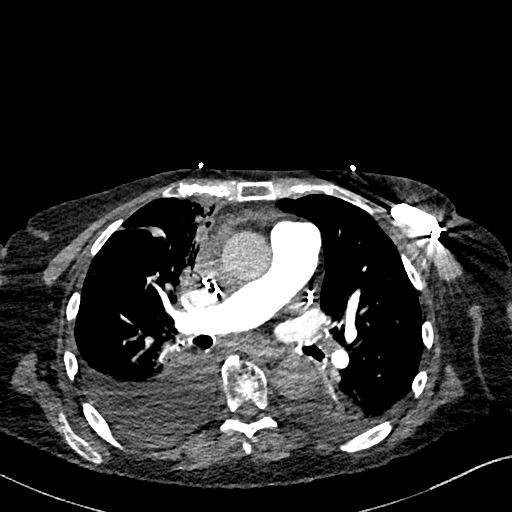
[im 177/289  lung]
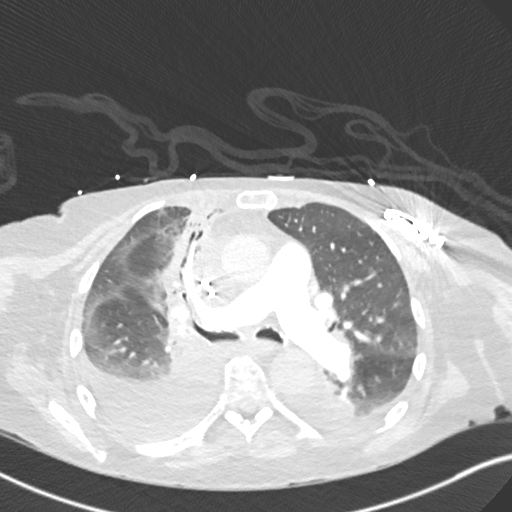
[im 193/289  mediastinal]
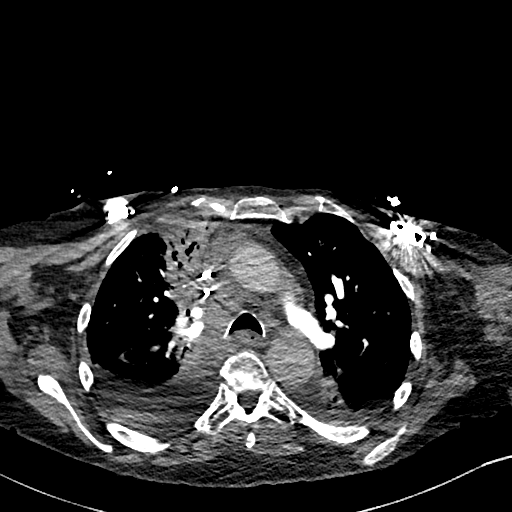
[im 209/289  lung]
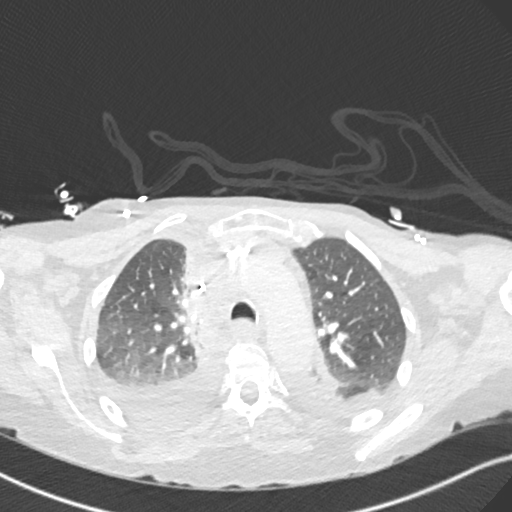
[im 225/289  mediastinal]
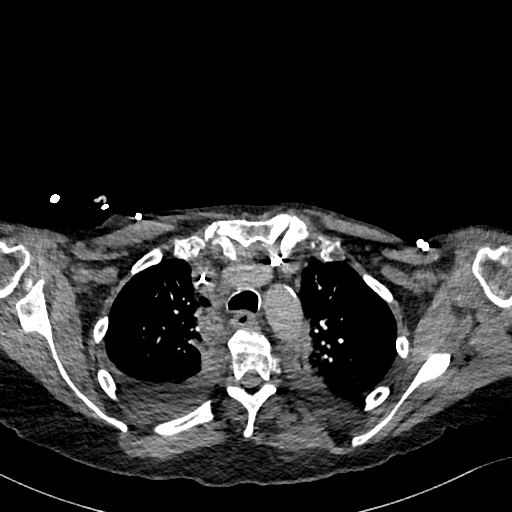
[im 241/289  lung]
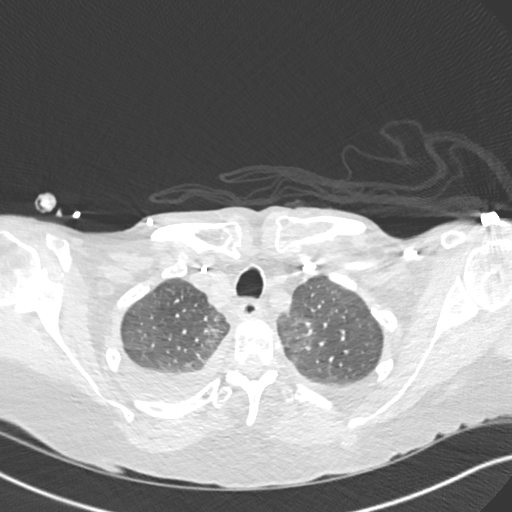
[im 257/289  mediastinal]
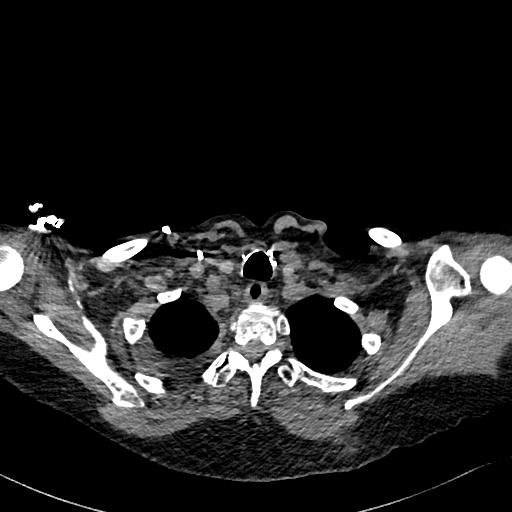
[im 273/289  lung]
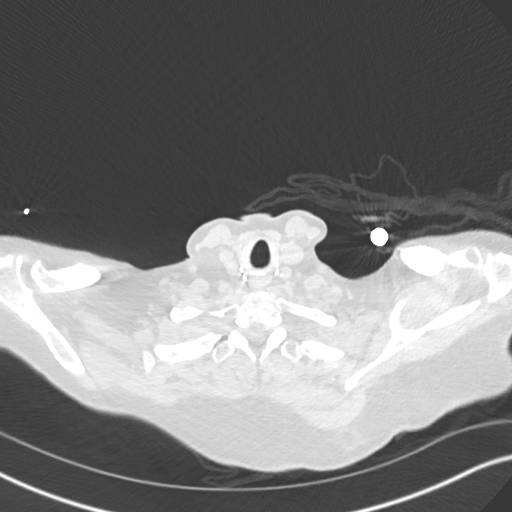

[Series 12: pe 2mm cor · coronal · 0.53mm/px · 1 of 140 slices shown]
[im 70/140  mediastinal]
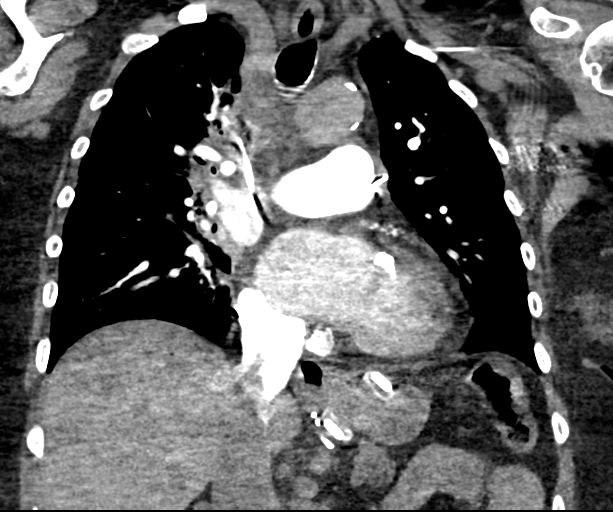

[18 of 36 positions shown; findings below may reference images not displayed]

FINDINGS: Cardiovascular: Satisfactory opacification of the pulmonary arteries
to the segmental level. No evidence of pulmonary embolism. No
pericardial effusion.

Mediastinum/Nodes: Stable right peritracheal in subcarinal
adenopathy is noted consistent with history of lymphoma. Esophagus
is unremarkable. Status post thyroidectomy.

Lungs/Pleura: No pneumothorax is noted. Bilateral pleural effusions
are noted with right greater than left, with adjacent subsegmental
atelectasis. Stable probable radiation fibrosis is noted medially in
right upper lobe.

Upper Abdomen: Status post lap band procedure.

Musculoskeletal: No chest wall abnormality. No acute or significant
osseous findings.

Review of the MIP images confirms the above findings.
IMPRESSION: No definite evidence of pulmonary embolus.

Moderate right pleural effusion is noted as well as mild left
pleural effusion, with adjacent subsegmental atelectasis.

Probable radiation fibrosis is noted medially in right upper lobe.

Stable mediastinal adenopathy compared to prior exam.

## 2019-04-19 IMAGING — DX DG CHEST 2V
2 series · 2 of 2 positions shown · non-contrast
Comparison: 09/15/2017.

CLINICAL DATA: Encounter for congestive heart failure.

EXAM:
CHEST  2 VIEW

[chest lat]
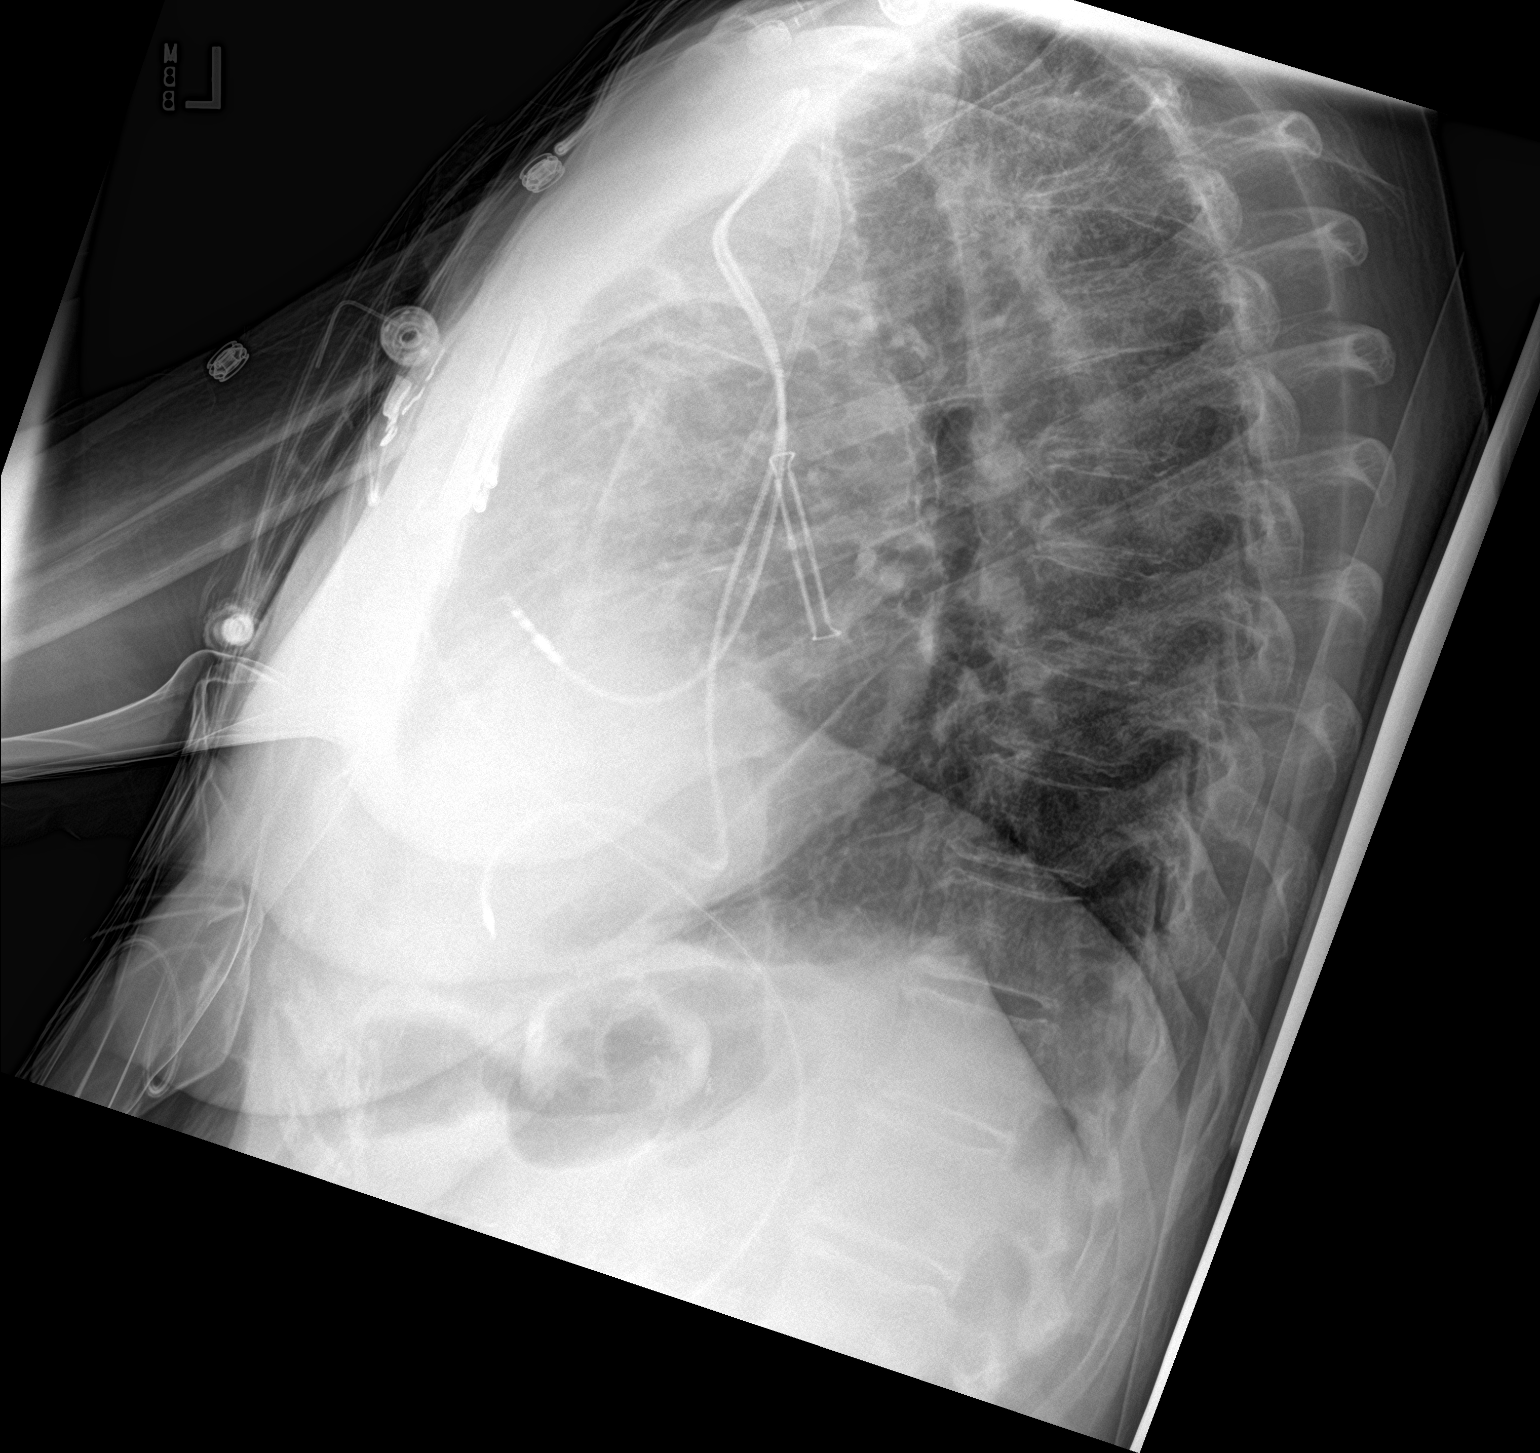

[chest ap]
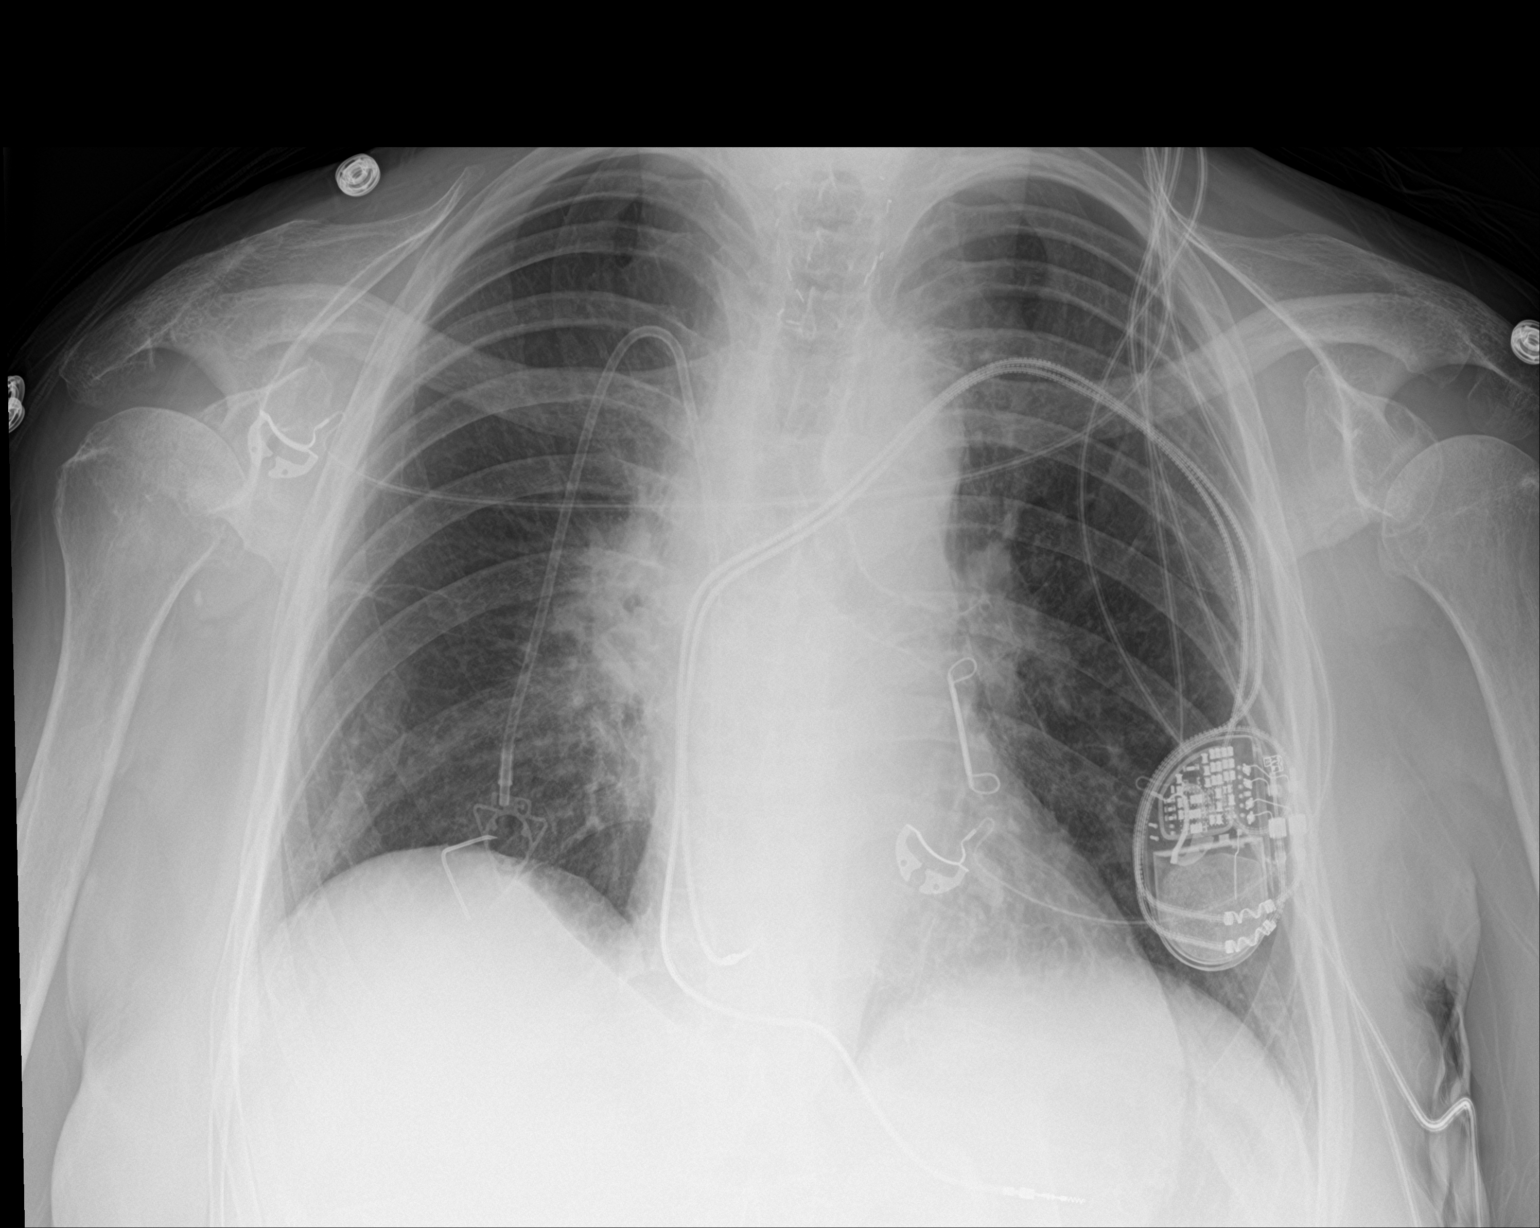

[2 of 2 positions shown; findings below may reference images not displayed]

FINDINGS: The heart size and mediastinal contours are within normal limits.
Dual lead pacer unchanged. Adenopathy with mediastinal fibrosis,
stable. Unchanged Port-A-Cath. Pleural effusions better visualized
on CT. No consolidation or frank edema.
IMPRESSION: Stable chest.

## 2019-04-29 DIAGNOSIS — H0102B Squamous blepharitis left eye, upper and lower eyelids: Secondary | ICD-10-CM | POA: Diagnosis not present

## 2019-04-29 DIAGNOSIS — H0102A Squamous blepharitis right eye, upper and lower eyelids: Secondary | ICD-10-CM | POA: Diagnosis not present

## 2019-04-30 DIAGNOSIS — M542 Cervicalgia: Secondary | ICD-10-CM | POA: Diagnosis not present

## 2019-05-03 ENCOUNTER — Other Ambulatory Visit: Payer: Self-pay | Admitting: Internal Medicine

## 2019-05-09 IMAGING — DX DG CHEST 2V
2 series · 2 of 2 positions shown · non-contrast
Comparison: PA and lateral chest x-ray September 16, 2017

CLINICAL DATA: Just completed chemotherapy for lymphoma. History of
lung and breast malignancy. Recently hospitalized for cough and
pleural effusion. Patient notes cough and shortness of breath
persist.

EXAM:
CHEST  2 VIEW

[chest pa]
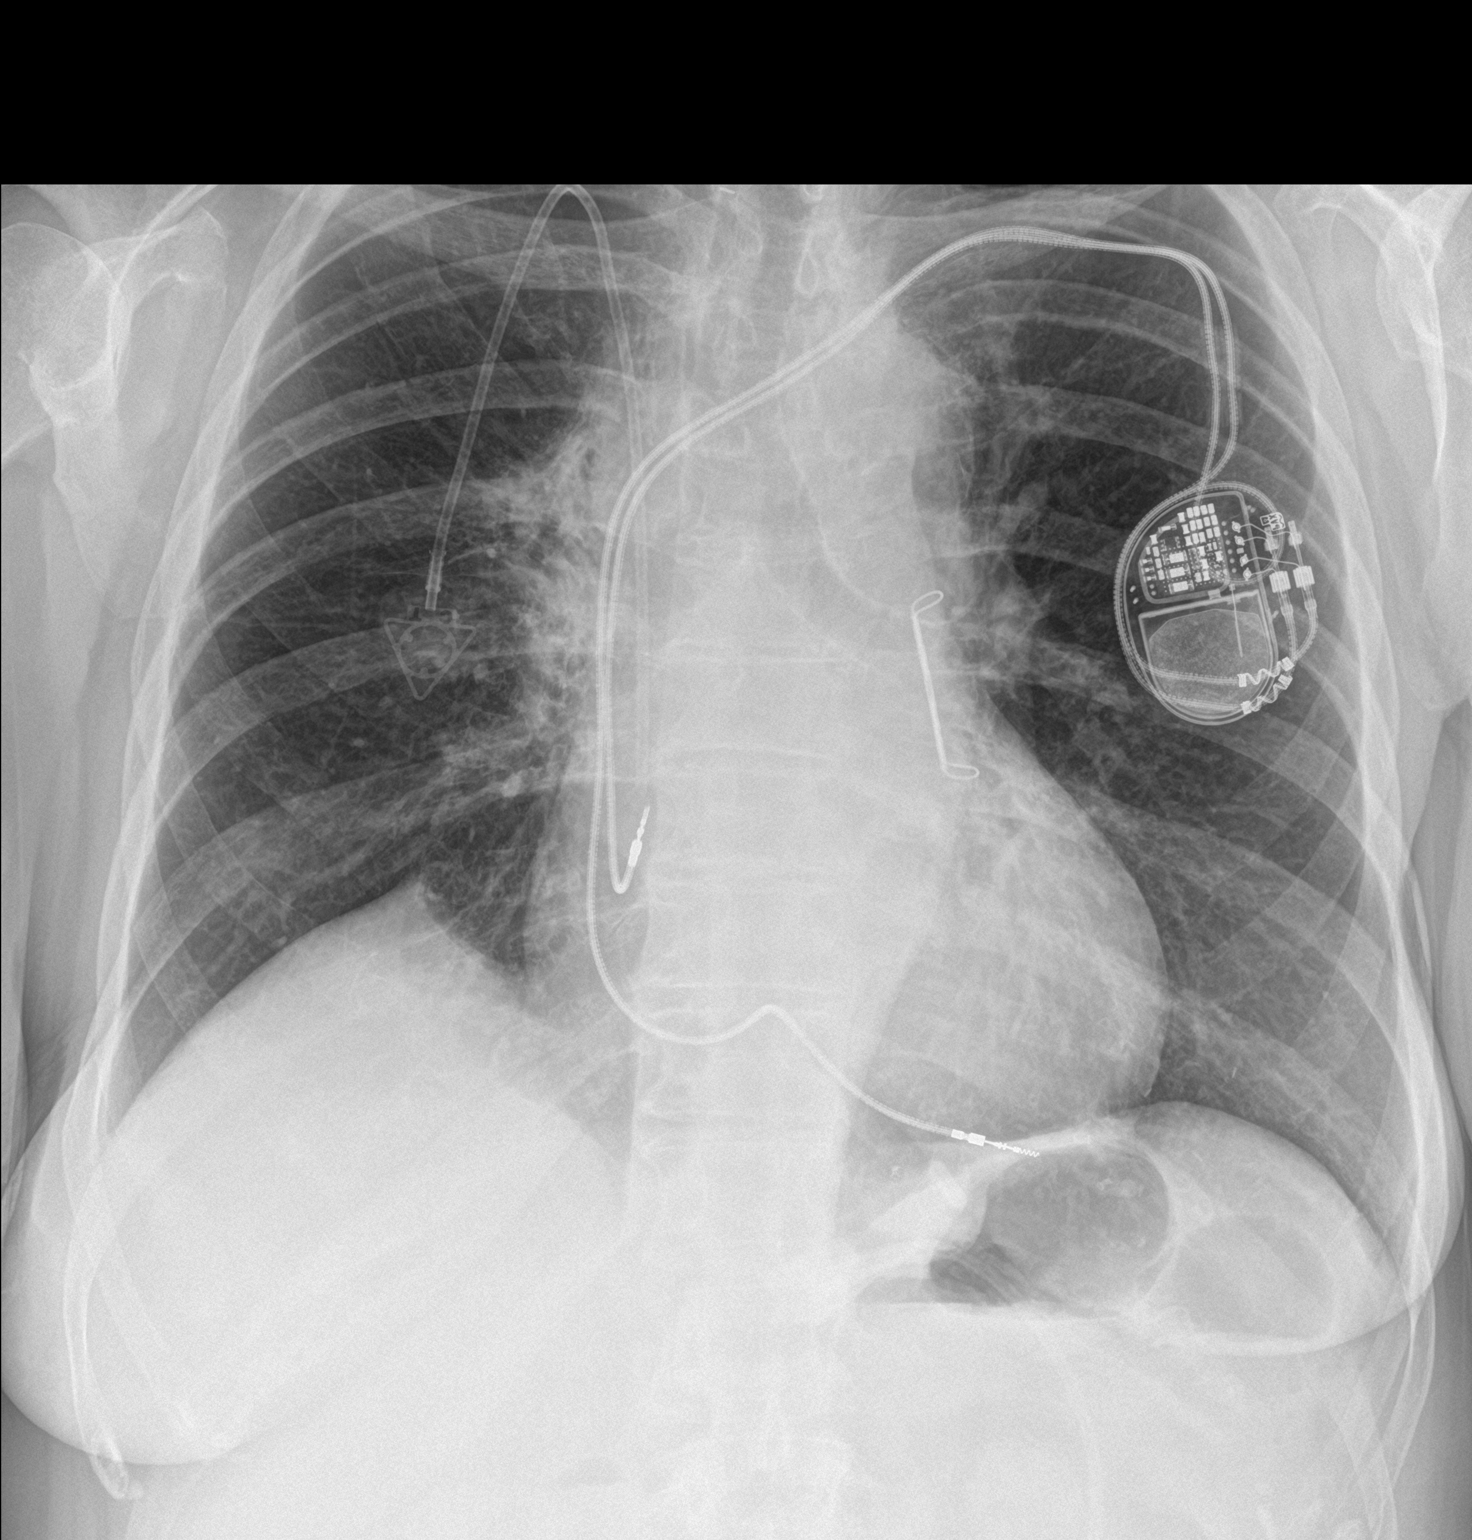

[chest lat]
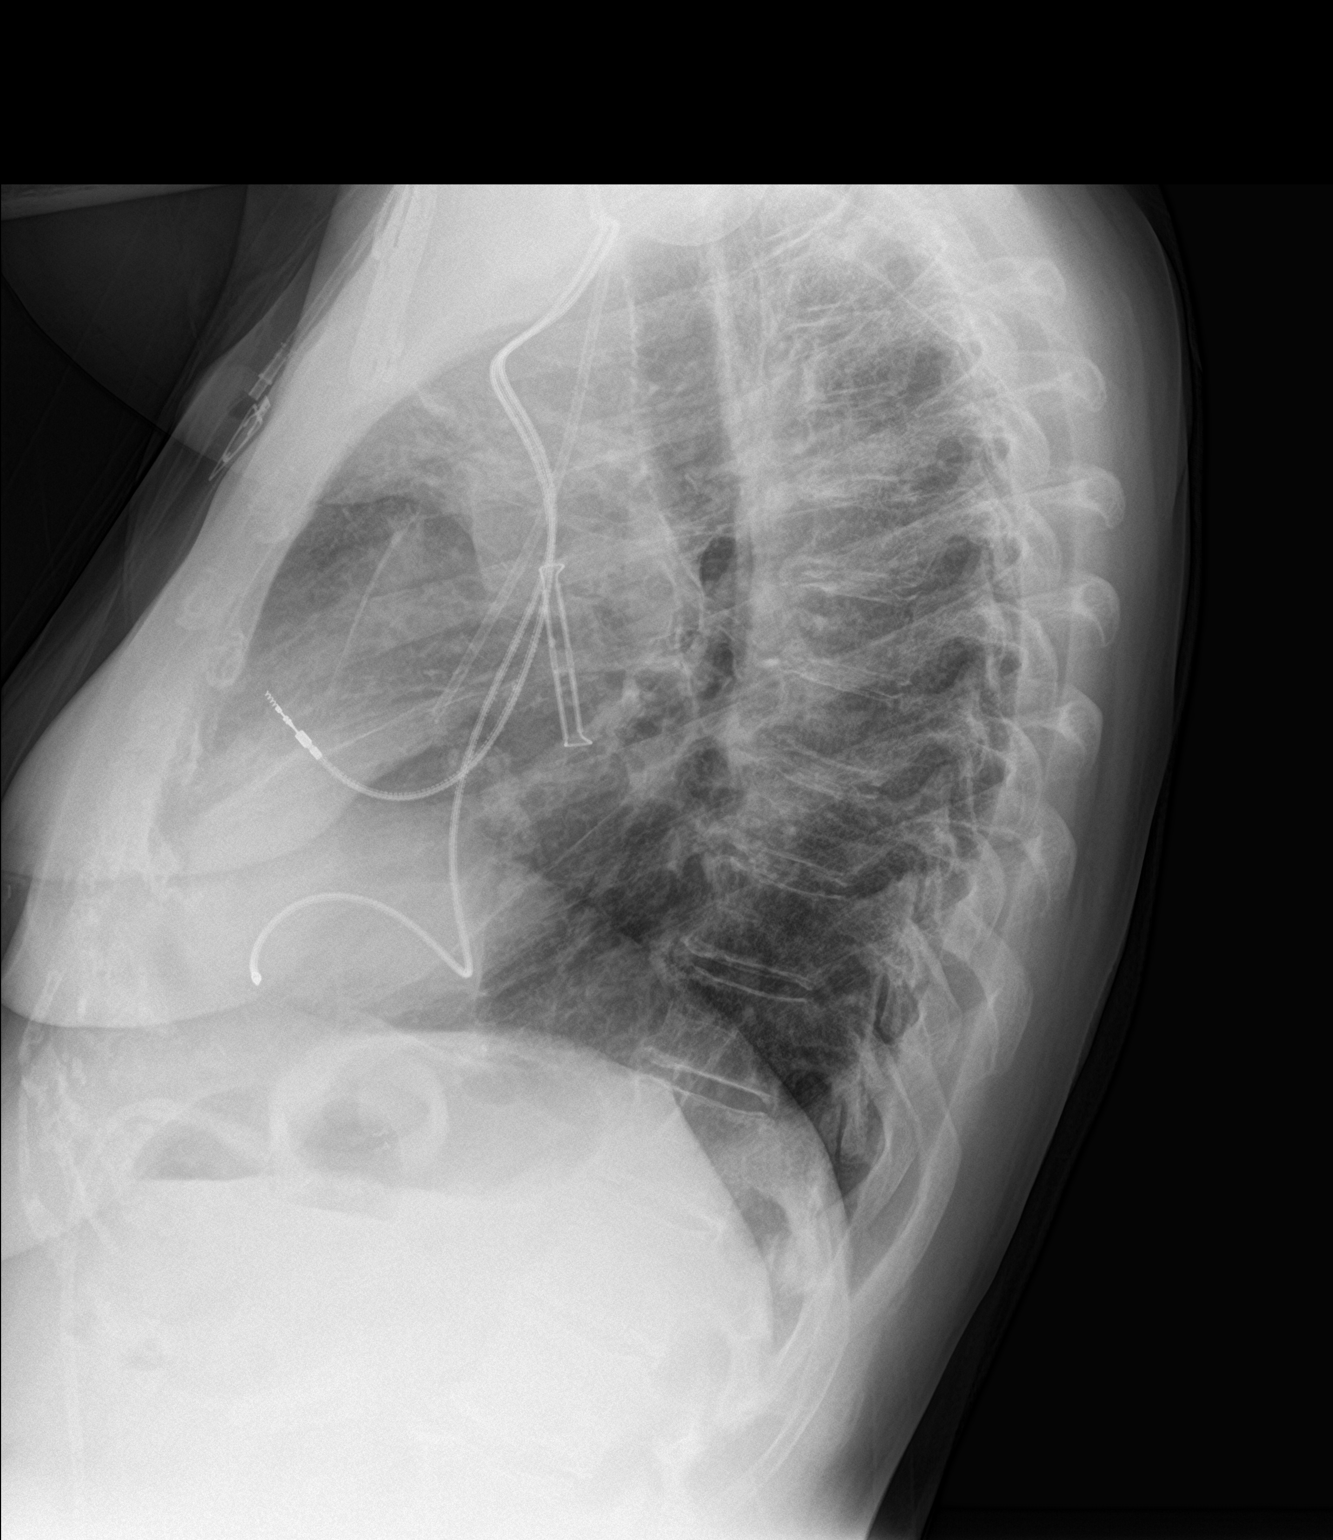

[2 of 2 positions shown; findings below may reference images not displayed]

FINDINGS: The lungs are adequately inflated. There is chronic tenting of the
dome of the right hemidiaphragm. There is no pleural effusion. There
is no alveolar or interstitial infiltrate. The heart and pulmonary
vascularity are normal. There is mild right hilar prominence which
is stable. The power port catheter tip projects over the junction of
the middle and distal thirds of the SVC. The ICD and the left atrial
appendage clip are stable in position. The bony thorax exhibits no
acute abnormality.
IMPRESSION: There is no pneumonia nor other acute cardiopulmonary abnormality.

## 2019-05-10 ENCOUNTER — Other Ambulatory Visit (HOSPITAL_COMMUNITY)
Admission: RE | Admit: 2019-05-10 | Discharge: 2019-05-10 | Disposition: A | Discharge status: Home / Self Care | Payer: Medicare Other | Source: Ambulatory Visit | Attending: Internal Medicine | Admitting: Internal Medicine

## 2019-05-10 DIAGNOSIS — Z01812 Encounter for preprocedural laboratory examination: Secondary | ICD-10-CM | POA: Diagnosis not present

## 2019-05-10 DIAGNOSIS — Z20828 Contact with and (suspected) exposure to other viral communicable diseases: Secondary | ICD-10-CM | POA: Diagnosis not present

## 2019-05-10 DIAGNOSIS — M6281 Muscle weakness (generalized): Secondary | ICD-10-CM | POA: Diagnosis not present

## 2019-05-10 DIAGNOSIS — S46911D Strain of unspecified muscle, fascia and tendon at shoulder and upper arm level, right arm, subsequent encounter: Secondary | ICD-10-CM | POA: Diagnosis not present

## 2019-05-10 DIAGNOSIS — M25611 Stiffness of right shoulder, not elsewhere classified: Secondary | ICD-10-CM | POA: Diagnosis not present

## 2019-05-10 LAB — SARS CORONAVIRUS 2 (TAT 6-24 HRS): SARS Coronavirus 2: NEGATIVE

## 2019-05-12 ENCOUNTER — Ambulatory Visit (INDEPENDENT_AMBULATORY_CARE_PROVIDER_SITE_OTHER): Payer: Medicare Other | Admitting: Internal Medicine

## 2019-05-12 ENCOUNTER — Other Ambulatory Visit: Payer: Self-pay

## 2019-05-12 DIAGNOSIS — Z950 Presence of cardiac pacemaker: Secondary | ICD-10-CM

## 2019-05-12 DIAGNOSIS — R059 Cough, unspecified: Secondary | ICD-10-CM

## 2019-05-12 DIAGNOSIS — I4819 Other persistent atrial fibrillation: Secondary | ICD-10-CM

## 2019-05-12 DIAGNOSIS — I495 Sick sinus syndrome: Secondary | ICD-10-CM

## 2019-05-12 DIAGNOSIS — I442 Atrioventricular block, complete: Secondary | ICD-10-CM

## 2019-05-12 DIAGNOSIS — R05 Cough: Secondary | ICD-10-CM

## 2019-05-12 LAB — PULMONARY FUNCTION TEST
DL/VA % pred: 66 %
DL/VA: 2.69 ml/min/mmHg/L
DLCO unc % pred: 61 %
DLCO unc: 12.69 ml/min/mmHg
FEF 25-75 Post: 3.14 L/sec
FEF 25-75 Pre: 2.72 L/sec
FEF2575-%Change-Post: 15 %
FEF2575-%Pred-Post: 171 %
FEF2575-%Pred-Pre: 149 %
FEV1-%Change-Post: 2 %
FEV1-%Pred-Post: 114 %
FEV1-%Pred-Pre: 112 %
FEV1-Post: 2.68 L
FEV1-Pre: 2.62 L
FEV1FVC-%Change-Post: 2 %
FEV1FVC-%Pred-Pre: 111 %
FEV6-%Change-Post: 0 %
FEV6-%Pred-Post: 105 %
FEV6-%Pred-Pre: 105 %
FEV6-Post: 3.12 L
FEV6-Pre: 3.12 L
FEV6FVC-%Pred-Post: 104 %
FEV6FVC-%Pred-Pre: 104 %
FVC-%Change-Post: 0 %
FVC-%Pred-Post: 100 %
FVC-%Pred-Pre: 100 %
FVC-Post: 3.12 L
FVC-Pre: 3.12 L
Post FEV1/FVC ratio: 86 %
Post FEV6/FVC ratio: 100 %
Pre FEV1/FVC ratio: 84 %
Pre FEV6/FVC Ratio: 100 %
RV % pred: 64 %
RV: 1.52 L
TLC % pred: 88 %
TLC: 4.76 L

## 2019-05-12 NOTE — Progress Notes (Signed)
Full PFT performed today. °

## 2019-05-17 DIAGNOSIS — S46911D Strain of unspecified muscle, fascia and tendon at shoulder and upper arm level, right arm, subsequent encounter: Secondary | ICD-10-CM | POA: Diagnosis not present

## 2019-05-17 DIAGNOSIS — M25611 Stiffness of right shoulder, not elsewhere classified: Secondary | ICD-10-CM | POA: Diagnosis not present

## 2019-05-17 DIAGNOSIS — M6281 Muscle weakness (generalized): Secondary | ICD-10-CM | POA: Diagnosis not present

## 2019-05-20 DIAGNOSIS — M25611 Stiffness of right shoulder, not elsewhere classified: Secondary | ICD-10-CM | POA: Diagnosis not present

## 2019-05-20 DIAGNOSIS — M6281 Muscle weakness (generalized): Secondary | ICD-10-CM | POA: Diagnosis not present

## 2019-05-20 DIAGNOSIS — S46911D Strain of unspecified muscle, fascia and tendon at shoulder and upper arm level, right arm, subsequent encounter: Secondary | ICD-10-CM | POA: Diagnosis not present

## 2019-05-24 DIAGNOSIS — M25611 Stiffness of right shoulder, not elsewhere classified: Secondary | ICD-10-CM | POA: Diagnosis not present

## 2019-05-24 DIAGNOSIS — M6281 Muscle weakness (generalized): Secondary | ICD-10-CM | POA: Diagnosis not present

## 2019-05-24 DIAGNOSIS — S46911D Strain of unspecified muscle, fascia and tendon at shoulder and upper arm level, right arm, subsequent encounter: Secondary | ICD-10-CM | POA: Diagnosis not present

## 2019-05-25 DIAGNOSIS — C44329 Squamous cell carcinoma of skin of other parts of face: Secondary | ICD-10-CM | POA: Diagnosis not present

## 2019-05-25 DIAGNOSIS — L720 Epidermal cyst: Secondary | ICD-10-CM | POA: Diagnosis not present

## 2019-05-25 DIAGNOSIS — D485 Neoplasm of uncertain behavior of skin: Secondary | ICD-10-CM | POA: Diagnosis not present

## 2019-05-25 DIAGNOSIS — L570 Actinic keratosis: Secondary | ICD-10-CM | POA: Diagnosis not present

## 2019-05-25 DIAGNOSIS — L218 Other seborrheic dermatitis: Secondary | ICD-10-CM | POA: Diagnosis not present

## 2019-05-25 DIAGNOSIS — D763 Other histiocytosis syndromes: Secondary | ICD-10-CM | POA: Diagnosis not present

## 2019-05-25 DIAGNOSIS — L819 Disorder of pigmentation, unspecified: Secondary | ICD-10-CM | POA: Diagnosis not present

## 2019-05-26 DIAGNOSIS — M542 Cervicalgia: Secondary | ICD-10-CM | POA: Diagnosis not present

## 2019-05-26 DIAGNOSIS — M6281 Muscle weakness (generalized): Secondary | ICD-10-CM | POA: Diagnosis not present

## 2019-05-26 DIAGNOSIS — M25611 Stiffness of right shoulder, not elsewhere classified: Secondary | ICD-10-CM | POA: Diagnosis not present

## 2019-05-26 DIAGNOSIS — S46911D Strain of unspecified muscle, fascia and tendon at shoulder and upper arm level, right arm, subsequent encounter: Secondary | ICD-10-CM | POA: Diagnosis not present

## 2019-06-01 DIAGNOSIS — S46911D Strain of unspecified muscle, fascia and tendon at shoulder and upper arm level, right arm, subsequent encounter: Secondary | ICD-10-CM | POA: Diagnosis not present

## 2019-06-01 DIAGNOSIS — M6281 Muscle weakness (generalized): Secondary | ICD-10-CM | POA: Diagnosis not present

## 2019-06-01 DIAGNOSIS — M25611 Stiffness of right shoulder, not elsewhere classified: Secondary | ICD-10-CM | POA: Diagnosis not present

## 2019-06-03 DIAGNOSIS — M6281 Muscle weakness (generalized): Secondary | ICD-10-CM | POA: Diagnosis not present

## 2019-06-03 DIAGNOSIS — M25611 Stiffness of right shoulder, not elsewhere classified: Secondary | ICD-10-CM | POA: Diagnosis not present

## 2019-06-03 DIAGNOSIS — S46911D Strain of unspecified muscle, fascia and tendon at shoulder and upper arm level, right arm, subsequent encounter: Secondary | ICD-10-CM | POA: Diagnosis not present

## 2019-06-07 ENCOUNTER — Ambulatory Visit (INDEPENDENT_AMBULATORY_CARE_PROVIDER_SITE_OTHER): Payer: Medicare Other | Admitting: Pulmonary Disease

## 2019-06-07 ENCOUNTER — Other Ambulatory Visit: Payer: Self-pay

## 2019-06-07 ENCOUNTER — Encounter: Payer: Self-pay | Admitting: Pulmonary Disease

## 2019-06-07 VITALS — BP 118/84 | HR 71 | Temp 98.2°F | Ht 66.0 in | Wt 225.2 lb

## 2019-06-07 DIAGNOSIS — I272 Pulmonary hypertension, unspecified: Secondary | ICD-10-CM

## 2019-06-07 DIAGNOSIS — R0609 Other forms of dyspnea: Secondary | ICD-10-CM

## 2019-06-07 DIAGNOSIS — M25611 Stiffness of right shoulder, not elsewhere classified: Secondary | ICD-10-CM | POA: Diagnosis not present

## 2019-06-07 DIAGNOSIS — S46911D Strain of unspecified muscle, fascia and tendon at shoulder and upper arm level, right arm, subsequent encounter: Secondary | ICD-10-CM | POA: Diagnosis not present

## 2019-06-07 DIAGNOSIS — M6281 Muscle weakness (generalized): Secondary | ICD-10-CM | POA: Diagnosis not present

## 2019-06-07 NOTE — Progress Notes (Signed)
[facility]  Chief Complaint  Patient presents with   Follow-up    She reports increased SOB for abuot 5 months that is getting worse. Ct 07/10. PFT 08/19. She reports she has to stop 4-5 times while walking to catch her breath. She reports increased fatigue.      Constitutional:  BP 118/84    Pulse 71    Temp 98.2 F (36.8 C) (Temporal)    Ht 5' 6" (1.676 m)    Wt 225 lb 3.2 oz (102.2 kg)    SpO2 98%    BMI 36.35 kg/m   Past Medical History:  Thyroid cancer, NHL, Sinus node dysfunction s/p ablation and PM, CVA, A fib, Nephrolithiasis, HTN, Hiatal hernia, Brucellosis, Endometriosis, HLD, Colon polyp, OA, Breast cancer  Brief Summary:  Kayla Harrison is a 73 y.o. female with dyspnea, dry cough.  She has hx of non Hodgkin's lymphoma s/p R-CHOP from 04/02/17 to 07/16/17.  She gets short of breath when she walks outside in hot, humid weather.  Has occasional cough.  This happens more when she changes position (laying to sitting up, or sitting to standing).  Not having chest congestion, or wheezing.  Not having significant allergy symptoms.  PFT from August showed mild decrease in diffusion capacity compared to 2014, but this is likely from radiation changes after treatment for lymphoma.  No evidence for amiodarone lung toxicity on CT chest from July (reviewed by me).  She has small Rt effusion and small cluster of nodules in RLL.  Neither of these seem significant enough to explain her symptoms.  She is able to exercise in a pool for about 40 minutes w/o difficulty.  She had Echo last in December 2018 that showed elevated pulmonary pressure.   Physical Exam:   Appearance - well kempt   ENMT - no sinus tenderness, no nasal discharge, no oral exudate  Neck - no masses, trachea midline, no thyromegaly, no elevation in JVP  Respiratory - normal appearance of chest wall, normal respiratory effort w/o accessory muscle use, no dullness on percussion,  no wheezing or rales  CV - s1s2 regular rate and rhythm, no murmurs, 1+ peripheral edema, radial pulses symmetric  GI - soft, non tender  Lymph - no adenopathy noted in neck and axillary areas  MSK - normal gait  Ext - no cyanosis, clubbing, or joint inflammation noted  Skin - no rashes, lesions, or ulcers  Neuro - normal strength, oriented x 3  Psych - normal mood and affect   Discussion:  She has dyspnea on exertion.  PFT showed mild diffusion defect.  CT chest showed small rt effusion which has been recurrent, but certainly not big enough to explain her symptoms.  Nodular cluster in RLL has decreased in size, and not likely causing her symptoms either.  She did have elevated pulmonary pressure from echo in December 2018.  No evidence for amiodarone lung toxicity.  Assessment/Plan:   Dyspnea on exertion. - will arrange for Echo to assess for pulmonary hypertension  Right lower lung nodular cluster. - likely benign  - no additional imaging needed for this  Small, recurrent Rt sided effusion. - monitor clinically for now - not likely contributing to symptoms at present - repeat CXR at next visit   Patient Instructions  Will arrange for Echocardiogram  Follow up in 6 weeks    Chesley Mires, MD [place] Pager: [phone] 06/07/2019, 11:55 AM  Flow Sheet  Pulmonary tests:  PFT 01/20/13 >> FEV1 2.97 (118%), FEV1% 86, TLC 5.91 (110%), DLCO 82% PFT 05/12/19 >> FEV1 2.68 (114%), FEV1% 86, TLC 4.55 (85%), DLCO 61%  Chest imaging:  CT angio chest 09/15/17 >> b/l pleural effusions, radiation fibrosis RUL CT chest 528/19 >>  stable 6 mm LLL nodule, patchy nodular consolidation RLL, stable 3 mm RUL nodule, stable Rt paramediastinal post radiation changes. CT chest 05/29/18 >> XRT changes paramediastium, decreased size of nodular cluster RLL, tiny Rt effusion CT chest 04/02/19 >> mild CM, atherosclerosis, s/p thyroidectomy, paramediastinal XRT  changes b/l Rt > Lt, nodular cluster RLL decreased in size, small Rt effusion  Cardiac tests:  Echo 09/17/17 >> EF 55 to 60%, mild MR, severe LA dilation, PAS 44 mmHg  Medications:   Allergies as of 06/07/2019      Reactions   Dofetilide Other (See Comments)   Prolonged QT interval   Rivaroxaban Other (See Comments)   Nose Bleed X 6 hrs ; packing in ER Nasal hemorrhage   Epinephrine Rash, Other (See Comments)   Oral anesthetic Dental form only (liquid). Patient stated she will become "out of it" she can hear you but cannot respond in normal fashion. "not fully with it"   Adhesive [tape] Rash   Paper tape as well   Ketamine Other (See Comments)   HALLUCINATIONS      Medication List       Accurate as of June 07, 2019 11:55 AM. If you have any questions, ask your nurse or doctor.        STOP taking these medications   lidocaine 5 % Commonly known as: Lidoderm Stopped by: Chesley Mires, MD   traMADol 50 MG tablet Commonly known as: ULTRAM Stopped by: Chesley Mires, MD     TAKE these medications   amiodarone 200 MG tablet Commonly known as: PACERONE Take 1 tablet (200 mg total) by mouth daily.   apixaban 5 MG Tabs tablet Commonly known as: Eliquis Take 1 tablet (5 mg total) by mouth 2 (two) times daily.   levothyroxine 175 MCG tablet Commonly known as: SYNTHROID Take 175 mcg by mouth See admin instructions. TAKE 1 TABLET (175 MCG) BY MOUTH DAILY, EXCEPT FRIDAYS.   multivitamin tablet Take 1 tablet by mouth daily.   rosuvastatin 10 MG tablet Commonly known as: CRESTOR Take 1 tablet by mouth 2 (two) times a week. Takes on Monday and Thursday   Xiidra 5 % Soln Generic drug: Lifitegrast Apply 1 drop to eye at bedtime.       Past Surgical History:  She  has a past surgical history that includes Thyroidectomy (1998); bso (1998); Cholecystectomy; Tonsillectomy; Colonoscopy w/ polypectomy; afib ablation; Abdominal hysterectomy (1983); Breast lumpectomy (Left,  2010); Laparoscopic gastric banding (07/10/2010); Appendectomy; Knee arthroscopy; laparotomy; Total knee arthroplasty (04/13/2012); Cardioversion (10/09/2012); Cardioversion (10/09/2012); Cardioversion (N/A, 11/20/2012); Pacemaker insertion (03/10/2013); pocket revision (N/A, 12/08/2013); Eye surgery; Cystoscopy with retrograde pyelogram, ureteroscopy and stent placement (Right, 02/06/2015); Holmium laser application (N/A, 7/67/3419); Cystoscopy (N/A, 02/06/2015); Video bronchoscopy with endobronchial ultrasound (N/A, 02/07/2017); IR FLUORO GUIDE PORT INSERTION RIGHT (02/24/2017); IR US Guide Vasc Access Right (02/24/2017); Bone marrow biopsy (02/21/2017); Cystoscopy with retrograde pyelogram, ureteroscopy and stent placement (Right, 03/07/2017); Holmium laser application (Right, 3/79/0240); IR PATIENT EVAL TECH 0-60 MINS (03/11/2017); Cardioversion (N/A, 07/18/2017); TEE with cardioversion (09/22/2017); PORTA CATH INSERTION; TEE without cardioversion (N/A, 10/03/2017); Cardioversion (N/A, 10/03/2017); IR NEPHROSTOMY PLACEMENT RIGHT (11/17/2017); Nephrolithotomy (Right, 11/17/2017); Cystoscopy w/ retrogrades (Left, 11/17/2017); Holmium laser application (Left, 9/73/5329); Cardioversion (N/A, 01/07/2018);  IR REMOVAL TUN ACCESS W/ PORT W/O FL MOD SED (04/20/2018); Cardiac catheterization; Reverse shoulder arthroplasty (Right, 05/14/2018); fatty mass removal (1999); Laparoscopic gastric banding; and REVERSE SHOULDER REPLACEMENT (Right, 05/14/2018).  Family History:  Her family history includes Colon cancer in her father; Diabetes in her brother, paternal aunt, paternal grandmother, and sister; Heart attack in her father; Heart disease (age of onset: 50) in her father; Other in her mother.  Social History:  She  reports that she has never smoked. She has never used smokeless tobacco. She reports that she does not drink alcohol or use drugs.

## 2019-06-07 NOTE — Patient Instructions (Signed)
Will arrange for Echocardiogram  Follow up in 6 weeks

## 2019-06-09 DIAGNOSIS — M25611 Stiffness of right shoulder, not elsewhere classified: Secondary | ICD-10-CM | POA: Diagnosis not present

## 2019-06-09 DIAGNOSIS — M6281 Muscle weakness (generalized): Secondary | ICD-10-CM | POA: Diagnosis not present

## 2019-06-09 DIAGNOSIS — S46911D Strain of unspecified muscle, fascia and tendon at shoulder and upper arm level, right arm, subsequent encounter: Secondary | ICD-10-CM | POA: Diagnosis not present

## 2019-06-10 DIAGNOSIS — N183 Chronic kidney disease, stage 3 (moderate): Secondary | ICD-10-CM | POA: Diagnosis not present

## 2019-06-10 DIAGNOSIS — Z23 Encounter for immunization: Secondary | ICD-10-CM | POA: Diagnosis not present

## 2019-06-10 DIAGNOSIS — R7301 Impaired fasting glucose: Secondary | ICD-10-CM | POA: Diagnosis not present

## 2019-06-10 DIAGNOSIS — C50911 Malignant neoplasm of unspecified site of right female breast: Secondary | ICD-10-CM | POA: Diagnosis not present

## 2019-06-10 DIAGNOSIS — R269 Unspecified abnormalities of gait and mobility: Secondary | ICD-10-CM | POA: Diagnosis not present

## 2019-06-10 DIAGNOSIS — R918 Other nonspecific abnormal finding of lung field: Secondary | ICD-10-CM | POA: Diagnosis not present

## 2019-06-10 DIAGNOSIS — G609 Hereditary and idiopathic neuropathy, unspecified: Secondary | ICD-10-CM | POA: Diagnosis not present

## 2019-06-10 DIAGNOSIS — E7849 Other hyperlipidemia: Secondary | ICD-10-CM | POA: Diagnosis not present

## 2019-06-10 DIAGNOSIS — C73 Malignant neoplasm of thyroid gland: Secondary | ICD-10-CM | POA: Diagnosis not present

## 2019-06-10 DIAGNOSIS — E890 Postprocedural hypothyroidism: Secondary | ICD-10-CM | POA: Diagnosis not present

## 2019-06-10 DIAGNOSIS — C760 Malignant neoplasm of head, face and neck: Secondary | ICD-10-CM | POA: Diagnosis not present

## 2019-06-10 DIAGNOSIS — M859 Disorder of bone density and structure, unspecified: Secondary | ICD-10-CM | POA: Diagnosis not present

## 2019-06-11 ENCOUNTER — Other Ambulatory Visit (HOSPITAL_COMMUNITY): Payer: Medicare Other

## 2019-06-14 DIAGNOSIS — M84375A Stress fracture, left foot, initial encounter for fracture: Secondary | ICD-10-CM | POA: Diagnosis not present

## 2019-06-14 DIAGNOSIS — G5791 Unspecified mononeuropathy of right lower limb: Secondary | ICD-10-CM | POA: Diagnosis not present

## 2019-06-15 DIAGNOSIS — M25611 Stiffness of right shoulder, not elsewhere classified: Secondary | ICD-10-CM | POA: Diagnosis not present

## 2019-06-15 DIAGNOSIS — M6281 Muscle weakness (generalized): Secondary | ICD-10-CM | POA: Diagnosis not present

## 2019-06-15 DIAGNOSIS — S46911D Strain of unspecified muscle, fascia and tendon at shoulder and upper arm level, right arm, subsequent encounter: Secondary | ICD-10-CM | POA: Diagnosis not present

## 2019-06-16 ENCOUNTER — Ambulatory Visit (HOSPITAL_COMMUNITY): Payer: Medicare Other | Attending: Cardiology

## 2019-06-16 ENCOUNTER — Other Ambulatory Visit: Payer: Self-pay

## 2019-06-16 DIAGNOSIS — I272 Pulmonary hypertension, unspecified: Secondary | ICD-10-CM | POA: Diagnosis not present

## 2019-06-17 ENCOUNTER — Telehealth: Payer: Self-pay | Admitting: Pulmonary Disease

## 2019-06-17 NOTE — Telephone Encounter (Signed)
Echo 06/16/19 >> EF 50 to 55%, mod RV enlargement, mild MR, mild MS, mod TR, mod elevation in PASP, RVSP 45.5 mmHg   Spoke with pt over the phone about results.  Concerned she could have pulmonary hypertension as contributor to her dyspnea.  Will d/w Dr. Caryl Comes with cardiology and then determine if she needs further assessment with Rt heart catheterization.

## 2019-06-20 IMAGING — XA IR NEPHROSTOMY PLACEMENT RIGHT
2 series · 9 of 9 positions shown · non-contrast
Comparison: PET-CT - 08/27/2017

INDICATION: Renal stones, access for right percutaneous nephrolithotomy.

EXAM:
1. ULTRASOUND AND FLUOROSCOPIC GUIDED PUNCTURE OF THE RIGHT SIDED
RENAL COLLECTING SYSTEM.
2. FLUOROSCOPIC GUIDED PLACEMENT OF A RIGHT SIDED NEPHROURETERAL
CATHETER.

[Series 7: care single · 1 of 1 slices shown]
[im 1/1]
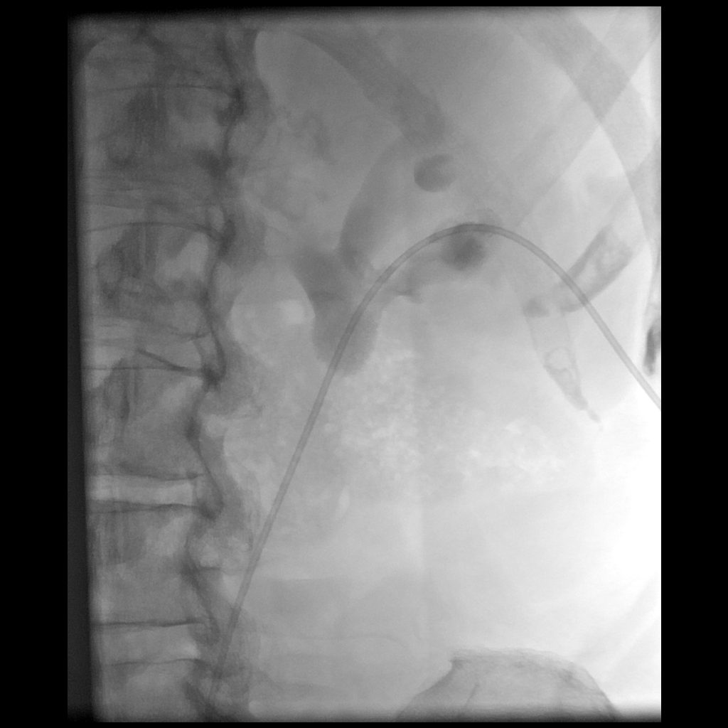

[Series 300: tube placements · 8 of 8 slices shown]
[im 1/8]
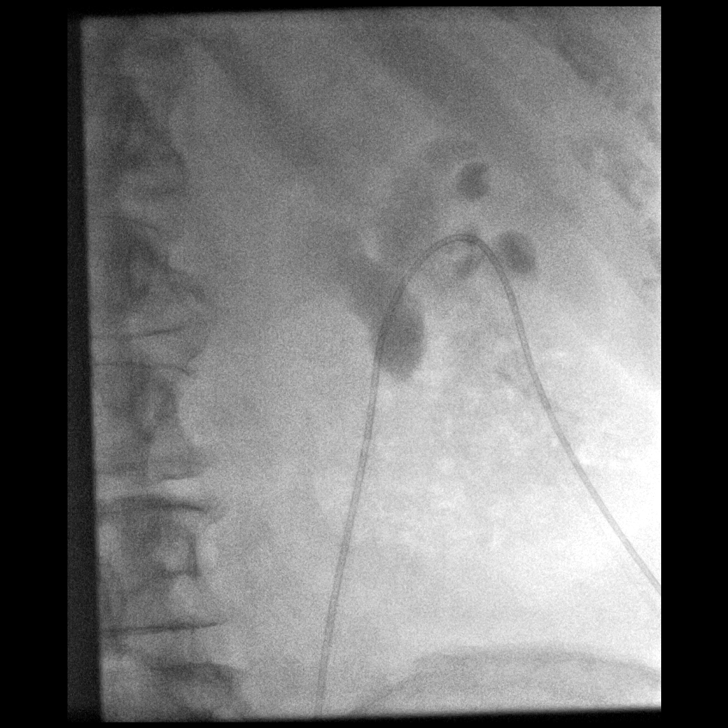
[im 2/8]
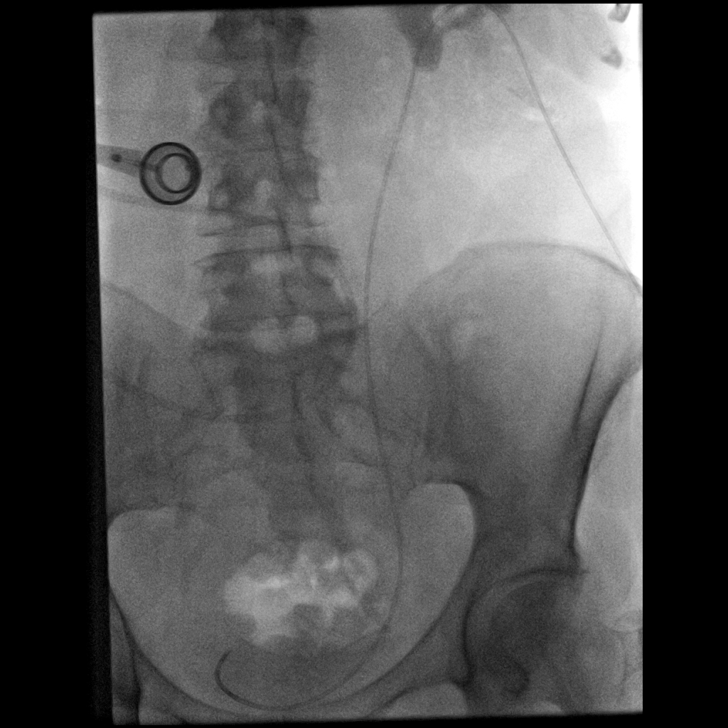
[im 3/8]
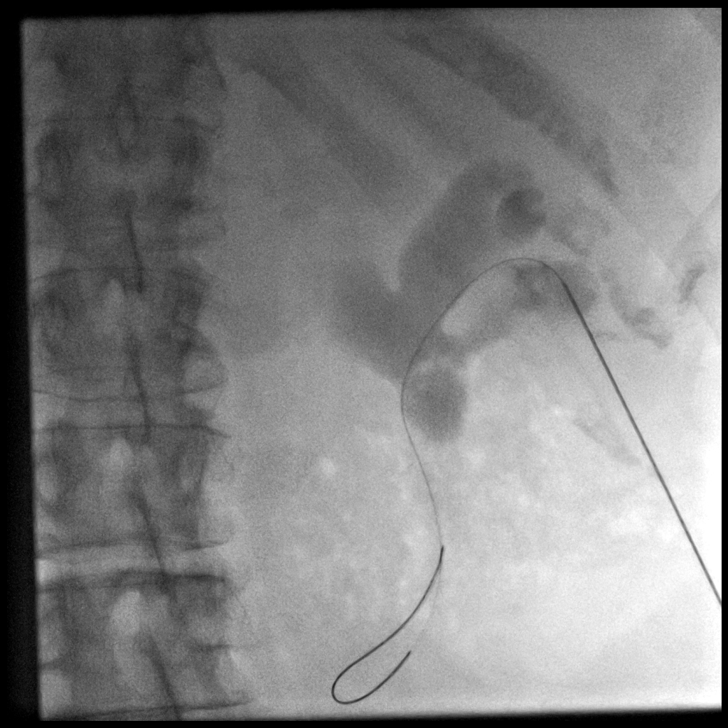
[im 4/8]
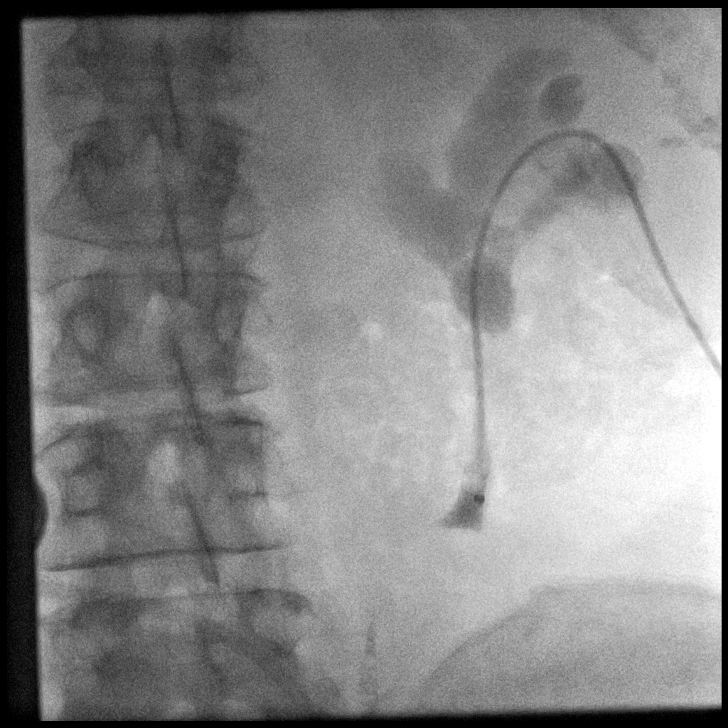
[im 5/8]
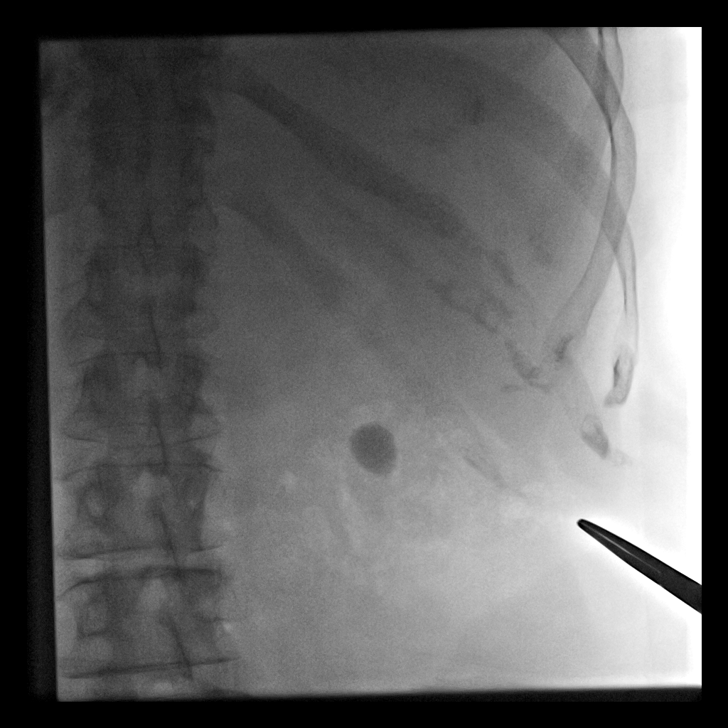
[im 6/8]
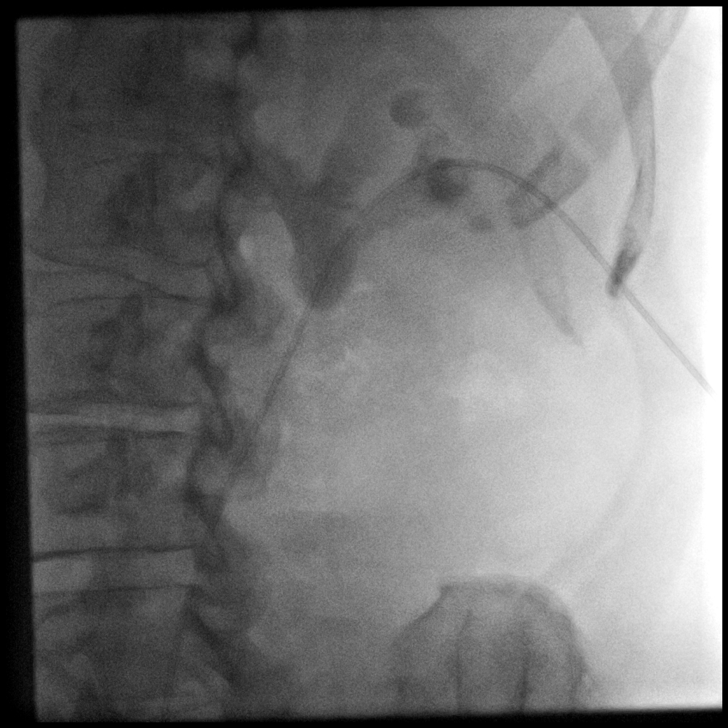
[im 7/8]
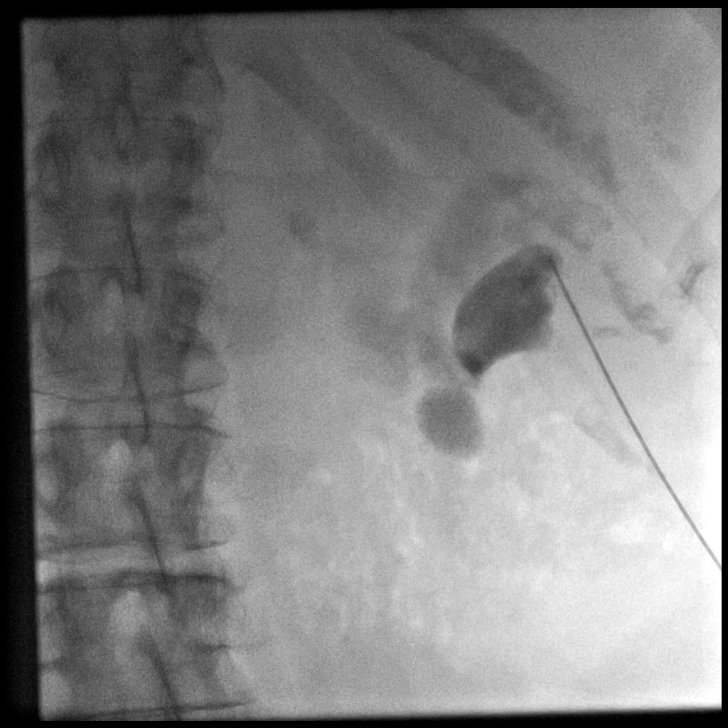
[im 8/8]
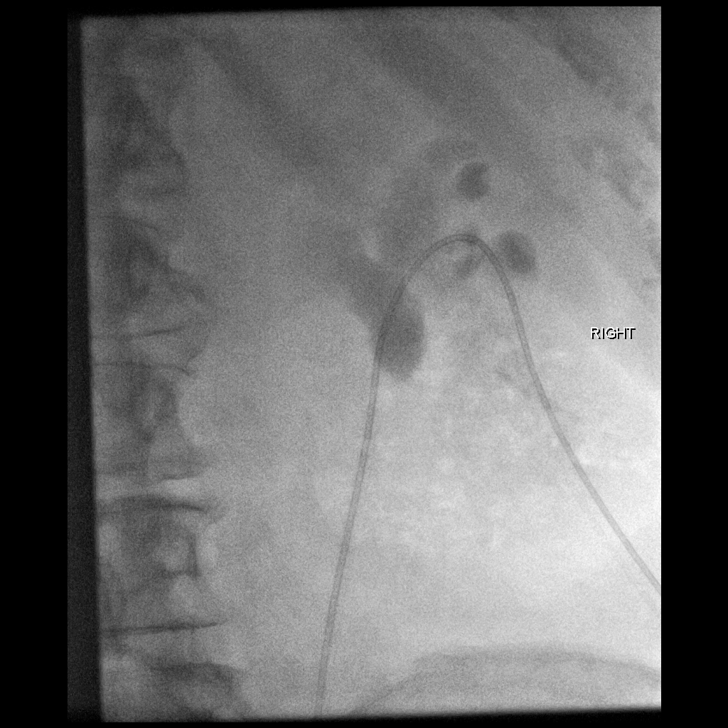

[9 of 9 positions shown; findings below may reference images not displayed]

MEDICATIONS:
Ancef 2 gm IV; The antibiotic was administered in an appropriate
time frame prior to skin puncture.

ANESTHESIA/SEDATION:
Moderate (conscious) sedation was employed during this procedure. A
total of Versed 3 mg and Fentanyl 100 mcg was administered
intravenously.

Moderate Sedation Time: 13 minutes. The patient's level of
consciousness and vital signs were monitored continuously by
radiology nursing throughout the procedure under my direct
supervision.

CONTRAST:  20 cc Gsovue-RLL - administered into the renal collecting
system.

FLUOROSCOPY TIME:  1 minute 12 seconds (18 mGy)

COMPLICATIONS:
None immediate.

PROCEDURE:
Informed written consent was obtained from the patient after a
discussion of the risks, benefits, and alternatives to treatment.
The flank flank region was prepped with Betadine in a sterile
fashion, and a sterile drape was applied covering the operative
field. A sterile gown and sterile gloves were used for the
procedure. A timeout was performed prior to the initiation of the
procedure.

A pre procedural spot fluoroscopic image was obtained of the upper
abdomen.

Ultrasound scanning was performed of the right kidney demonstrated
mild to moderate right-sided pelvicaliectasis. As such, a moderately
dilated posterior inferior calyx was targeted with a 22 gauge Chiba
needle under direct ultrasound guidance. Ultrasound images were
saved for procedural documentation purposes. Access to the
collecting system was confirmed with the injection of a small amount
of contrast a passage of a Nitrex wire through the right renal
collecting system and past the pelvic stone.

An Accustick set was utilized to dilate the tract and was
subsequently exchanged for a Kumpe catheter over a Bentson wire. The
Kumpe catheter was advanced down the ureter and into the urinary
bladder.

Postprocedural spot radiographs were obtained in various obliquities
and the catheter was sutured to the skin. The catheter was capped
and a dressing was placed.

The patient tolerated the procedure well without immediate post
procedural complication.
FINDINGS: Pre procedural spot radiographic images demonstrates an
approximately 1.6 x 1.2 cm stone overlying expected location of the
caudal aspect of the right renal pelvis, similar to PET-CT performed
08/27/2017.

Ultrasound scanning demonstrates mild to moderate right-sided
pelvicaliectasis as such, a posterior inferior calyx was targeted
with direct ultrasound guidance.

This allowed for placement of a Kumpe catheter through this
posterior inferior calyx past the pelvic stone to the level of the
urinary bladder.
IMPRESSION: Successful ultrasound and fluoroscopic guided placement of a right
sided 5 French Kumpe catheter to the level of the urinary bladder to
be utilized during impending nephrolithotomy procedure.

## 2019-06-20 IMAGING — CT CT ABDOMEN W/O CM
2 of 3 series · 15 of 46 positions shown, 17 images · non-contrast
Comparison: None.

CLINICAL DATA: Evaluate for residual fragments after RIGHT
percutaneous nephro LEFT lithotripsy

EXAM:
CT ABDOMEN WITHOUT CONTRAST
TECHNIQUE: Multidetector CT imaging of the abdomen was performed following the
standard protocol without IV contrast.

[Series 2: axial st · axial · 0.77mm/px · z∈[-264,-24]mm · 12 of 56 slices shown, 14 images]
[im 4/56  soft-tissue]
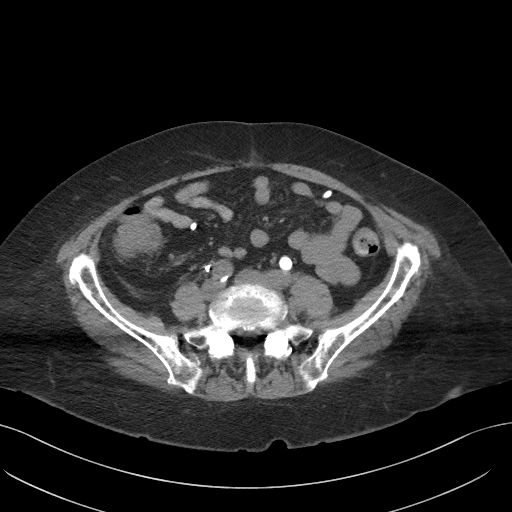
[im 4/56  bone]
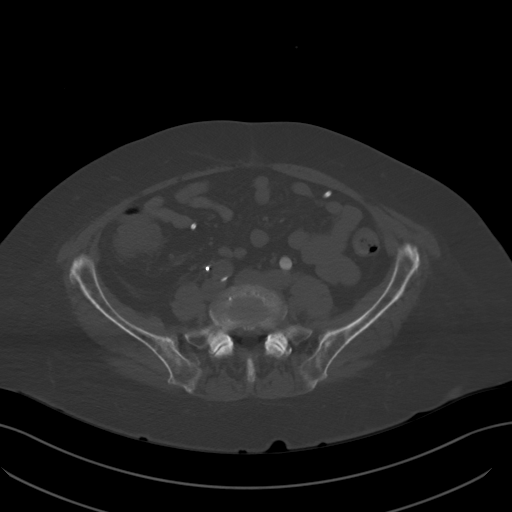
[im 8/56  soft-tissue]
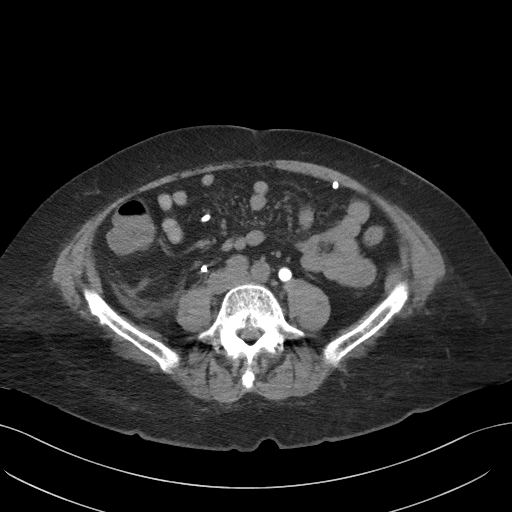
[im 13/56  soft-tissue]
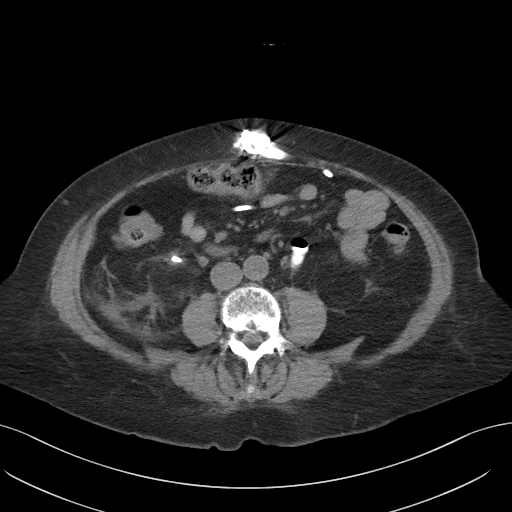
[im 16/56  soft-tissue]
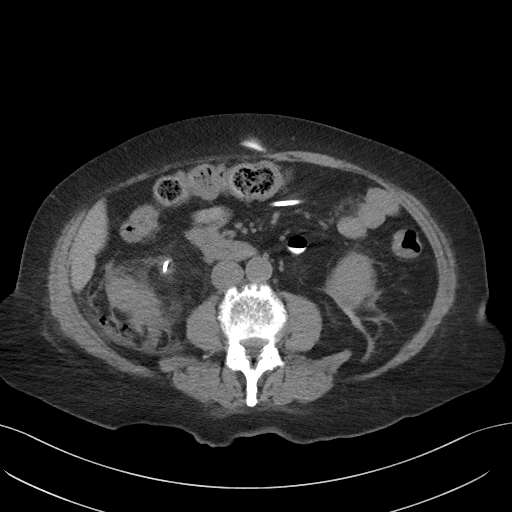
[im 22/56  soft-tissue]
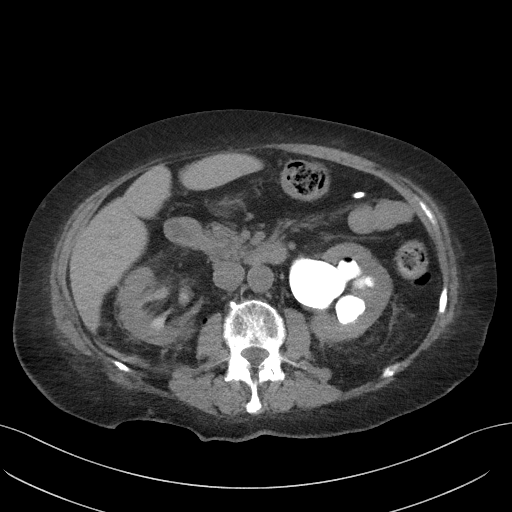
[im 25/56  soft-tissue]
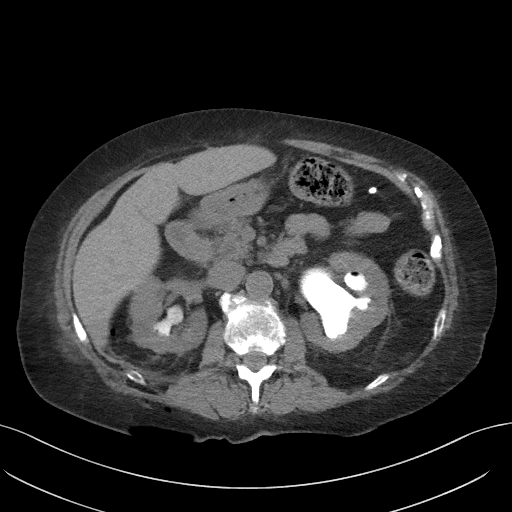
[im 31/56  soft-tissue]
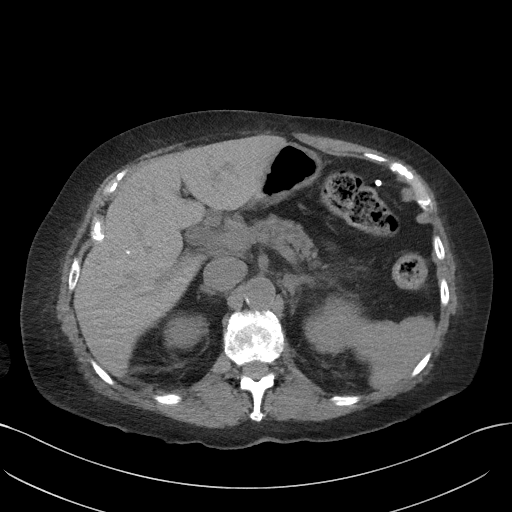
[im 34/56  soft-tissue]
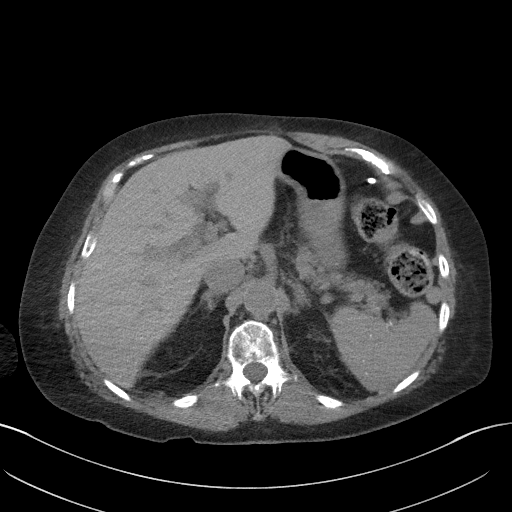
[im 40/56  soft-tissue]
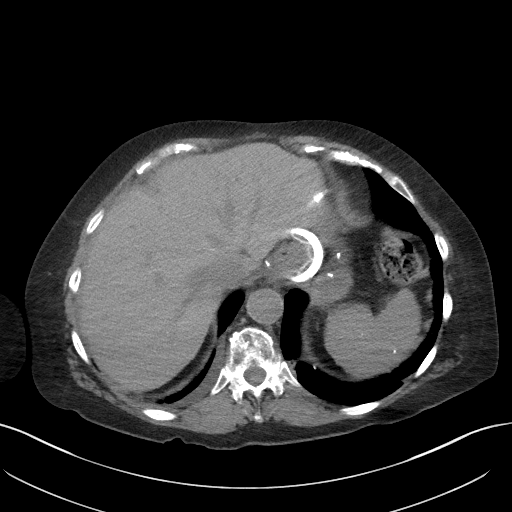
[im 40/56  bone]
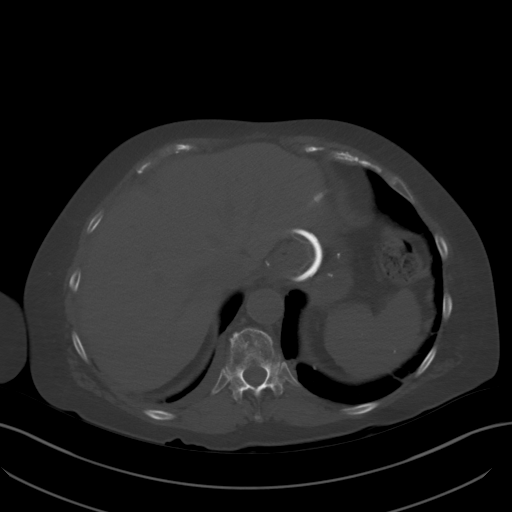
[im 43/56  soft-tissue]
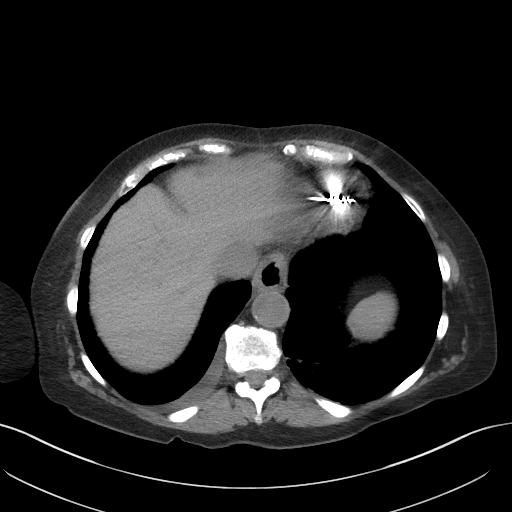
[im 48/56  soft-tissue]
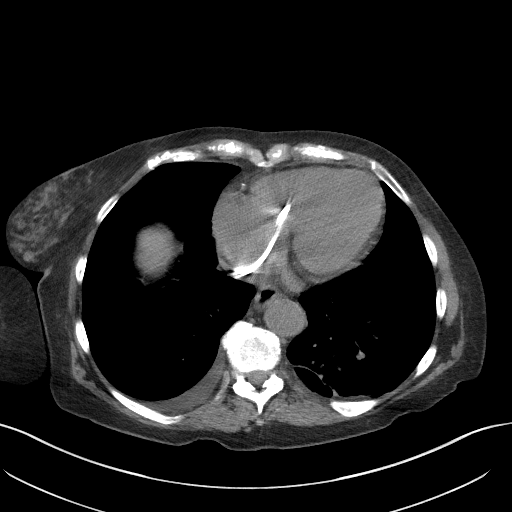
[im 52/56  soft-tissue]
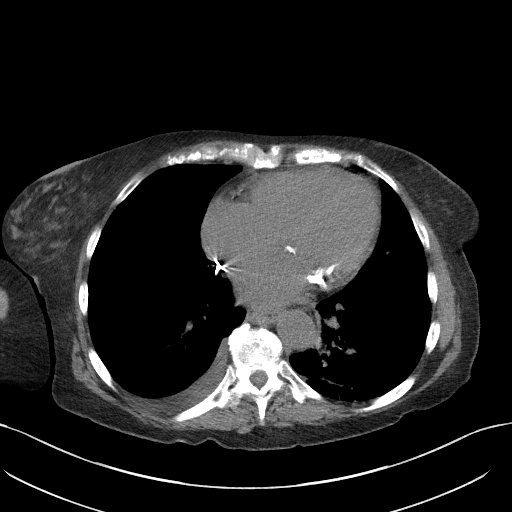

[Series 5: coronal st · coronal · 0.54mm/px · 3 of 88 slices shown]
[im 30/88  soft-tissue]
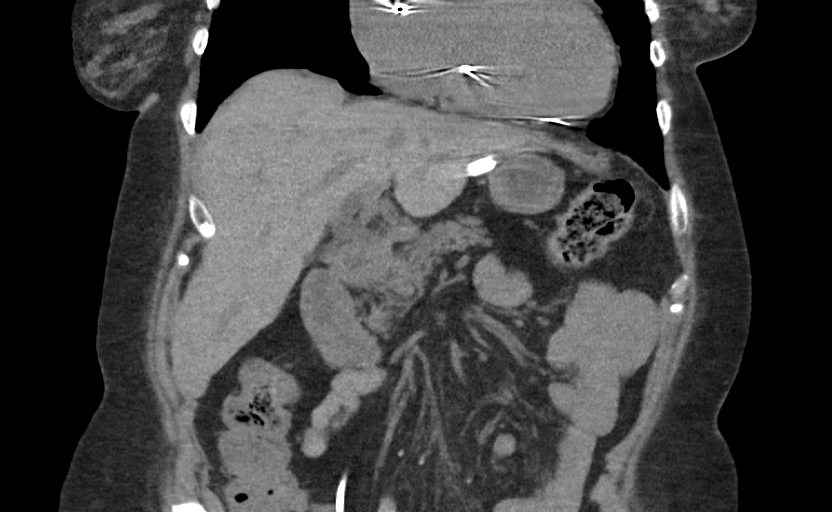
[im 39/88  soft-tissue]
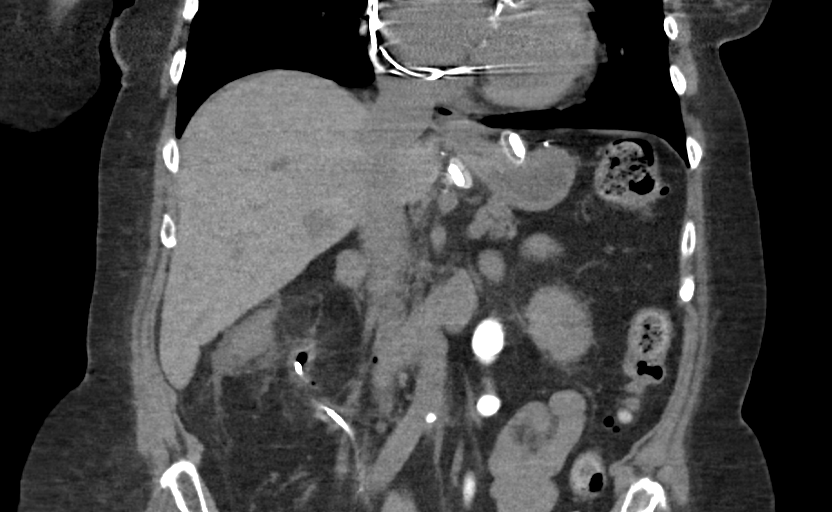
[im 49/88  soft-tissue]
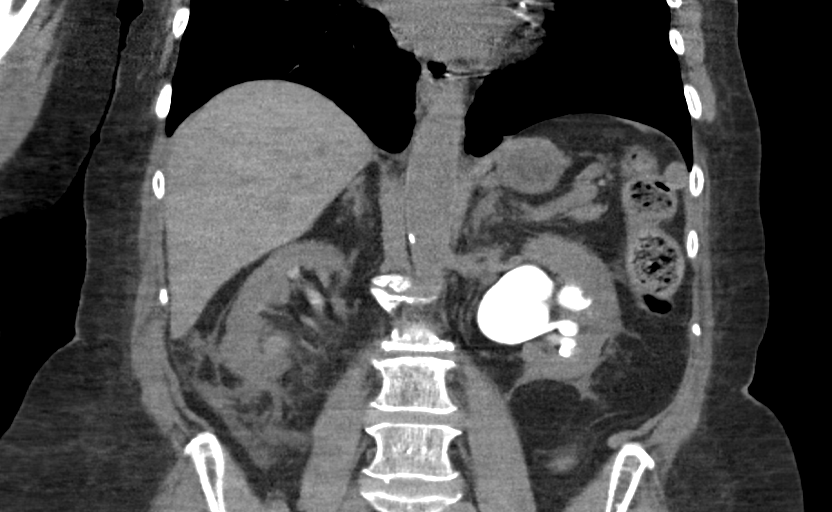

[15 of 46 positions shown; findings below may reference images not displayed]

FINDINGS: Lower chest:  Small RIGHT effusion.

Hepatobiliary: No focal hepatic lesion.  Postcholecystectomy

Pancreas: Normal pancreatic parenchymal intensity. No ductal
dilatation or inflammation.

Spleen: Granulomata within the spleen

Adrenals/urinary tract: Adrenal glands normal.

Upper aspect of the double-J ureteral stent within the RIGHT renal
collecting system. Tiny 2 mm upper pole calculi identified (coronal
image 56). Tiny 1 mm lower pole calculi on image 47, series 5..
There is residual contrast excreted in the RIGHT collecting system.
No hydronephrosis.

Perinephric stranding and small gas in the RIGHT pararenal fascia
consistent recent procedure.

There is hydronephrosis and hydroureter on the LEFT. Small gas
within the LEFT ureter and collecting system. There is a 2 calculi
within the LEFT kidney measuring 6 mm and 2 mm.

Stomach/Bowel: Gastric banding device noted. No complication of the
bowel.

Vascular/Lymphatic: Abdominal aorta normal.  No abdominal adenopathy

Musculoskeletal: No acute findings
IMPRESSION: 1. Double-J ureteral stent in place on the RIGHT. No evidence
obstruction.
2. Two tiny calculi within the RIGHT renal cortex.
3. LEFT hydronephrosis and hydroureter with retention of contrast.
4. LEFT nephrolithiasis.

## 2019-06-21 DIAGNOSIS — M84375D Stress fracture, left foot, subsequent encounter for fracture with routine healing: Secondary | ICD-10-CM | POA: Diagnosis not present

## 2019-06-22 DIAGNOSIS — L570 Actinic keratosis: Secondary | ICD-10-CM | POA: Diagnosis not present

## 2019-06-23 DIAGNOSIS — M6281 Muscle weakness (generalized): Secondary | ICD-10-CM | POA: Diagnosis not present

## 2019-06-23 DIAGNOSIS — M25611 Stiffness of right shoulder, not elsewhere classified: Secondary | ICD-10-CM | POA: Diagnosis not present

## 2019-06-23 DIAGNOSIS — S46911D Strain of unspecified muscle, fascia and tendon at shoulder and upper arm level, right arm, subsequent encounter: Secondary | ICD-10-CM | POA: Diagnosis not present

## 2019-06-26 NOTE — Telephone Encounter (Signed)
Reached out to [facility] re [place] He thought [place] might be helpful  We will arrange

## 2019-06-30 DIAGNOSIS — B029 Zoster without complications: Secondary | ICD-10-CM | POA: Diagnosis not present

## 2019-06-30 DIAGNOSIS — R21 Rash and other nonspecific skin eruption: Secondary | ICD-10-CM | POA: Diagnosis not present

## 2019-06-30 DIAGNOSIS — M792 Neuralgia and neuritis, unspecified: Secondary | ICD-10-CM | POA: Diagnosis not present

## 2019-07-05 ENCOUNTER — Telehealth: Payer: Self-pay

## 2019-07-05 DIAGNOSIS — M84375D Stress fracture, left foot, subsequent encounter for fracture with routine healing: Secondary | ICD-10-CM | POA: Diagnosis not present

## 2019-07-05 NOTE — Telephone Encounter (Signed)
-----   Message from Dollene Primrose, RN sent at 07/02/2019  4:57 PM EDT -----   Kayla Harrison  Here is that pt I texted you about. Thank you soooo much!!  Lor[place]  ----- Message ----- From: Deboraha Sprang, MD Sent: 06/26/2019   9:52 AM EDT To: Dollene Primrose, RN  DM thought she might benefit from [place]  I had spoken with her by phone 10/2 aobut this possibility  He offered to either do as it a procedure or meet w her in the office first whichever she prefers   would you be able to talk to her about this  Thanks SK

## 2019-07-05 NOTE — Telephone Encounter (Signed)
-----   Message from Dollene Primrose, RN sent at 07/02/2019  4:57 PM EDT -----   Kayla Harrison  Here is that pt I texted you about. Thank you soooo much!!  Lorren  ----- Message ----- From: Deboraha Sprang, MD Sent: 06/26/2019   9:52 AM EDT To: Dollene Primrose, RN  DM thought she might benefit from [place]  I had spoken with her by phone 10/2 aobut this possibility  He offered to either do as it a procedure or meet w her in the office first whichever she prefers   would you be able to talk to her about this  Thanks SK

## 2019-07-05 NOTE — Telephone Encounter (Signed)
I spoke with the pt and we talked about the consideration for her to have [place] per Dr. Claris Gladden recommendations..   Pt requested to keep her appt with Dr. Caryl Comes 07/06/19 at 2pm to further discuss.   She currently is in her 3rd week of shingles and she would not want any procedure until she has improved... no open wounds but she is still struggling with the discomfort.  Will keep her appt for 07/06/19.

## 2019-07-06 ENCOUNTER — Other Ambulatory Visit: Payer: Self-pay

## 2019-07-06 ENCOUNTER — Other Ambulatory Visit (HOSPITAL_COMMUNITY): Payer: Medicare Other

## 2019-07-06 ENCOUNTER — Encounter: Payer: Self-pay | Admitting: Internal Medicine

## 2019-07-06 ENCOUNTER — Ambulatory Visit (INDEPENDENT_AMBULATORY_CARE_PROVIDER_SITE_OTHER): Payer: Medicare Other | Admitting: Internal Medicine

## 2019-07-06 VITALS — BP 120/84 | HR 74 | Ht 66.0 in | Wt 228.0 lb

## 2019-07-06 DIAGNOSIS — I4892 Unspecified atrial flutter: Secondary | ICD-10-CM | POA: Diagnosis not present

## 2019-07-06 DIAGNOSIS — I495 Sick sinus syndrome: Secondary | ICD-10-CM | POA: Diagnosis not present

## 2019-07-06 DIAGNOSIS — I4819 Other persistent atrial fibrillation: Secondary | ICD-10-CM | POA: Diagnosis not present

## 2019-07-06 DIAGNOSIS — R0600 Dyspnea, unspecified: Secondary | ICD-10-CM | POA: Diagnosis not present

## 2019-07-06 DIAGNOSIS — Z950 Presence of cardiac pacemaker: Secondary | ICD-10-CM | POA: Diagnosis not present

## 2019-07-06 DIAGNOSIS — I442 Atrioventricular block, complete: Secondary | ICD-10-CM | POA: Diagnosis not present

## 2019-07-06 DIAGNOSIS — Z79899 Other long term (current) drug therapy: Secondary | ICD-10-CM | POA: Diagnosis not present

## 2019-07-06 DIAGNOSIS — R0609 Other forms of dyspnea: Secondary | ICD-10-CM

## 2019-07-06 LAB — CUP PACEART INCLINIC DEVICE CHECK
Battery Remaining Longevity: 57 mo
Battery Voltage: 2.99 V
Brady Statistic AP VP Percent: 0.16 %
Brady Statistic AP VS Percent: 3.31 %
Brady Statistic AS VP Percent: 0.06 %
Brady Statistic AS VS Percent: 96.48 %
Brady Statistic RA Percent Paced: 3.44 %
Brady Statistic RV Percent Paced: 0.22 %
Date Time Interrogation Session: 20201013182018
Implantable Lead Implant Date: 20140618
Implantable Lead Implant Date: 20140618
Implantable Lead Location: 753859
Implantable Lead Location: 753860
Implantable Pulse Generator Implant Date: 20140618
Lead Channel Impedance Value: 361 Ohm
Lead Channel Impedance Value: 437 Ohm
Lead Channel Impedance Value: 931 Ohm
Lead Channel Impedance Value: 988 Ohm
Lead Channel Pacing Threshold Amplitude: 1 V
Lead Channel Pacing Threshold Amplitude: 1 V
Lead Channel Pacing Threshold Pulse Width: 0.4 ms
Lead Channel Pacing Threshold Pulse Width: 0.4 ms
Lead Channel Sensing Intrinsic Amplitude: 0.375 mV
Lead Channel Sensing Intrinsic Amplitude: 14.375 mV
Lead Channel Setting Pacing Amplitude: 2.5 V
Lead Channel Setting Pacing Amplitude: 2.5 V
Lead Channel Setting Pacing Pulse Width: 0.4 ms
Lead Channel Setting Sensing Sensitivity: 0.9 mV

## 2019-07-06 NOTE — Progress Notes (Signed)
Patient Care Team: [name], MD as PCP - General (Endocrinology) Deboraha Sprang, MD as PCP - Cardiology (Cardiology)   HPI  Kayla Harrison is a 73 y.o. female Seen in followup for a pacemaker implanted at [facility] and atrial fibrillation.  She underwent pulmonary vein isolation at [facility] and had recurrent atrial fibrillation and underwent convergent ablation at [facility] 9/03; it was complicated by heart block prompting pacemaker implantation.   5/18 presented w SOB and facial fullness, found to have SVC syndrome 2/2 NHlymphoma Rx initially with XRT  Now in remission  She has had recurrent aFib and  was restarted on amio--and then stopped 2/2 worsening SOB  She has a history of prior strokes and remains on oral anticoagulation . She is on apixoban and tolerating well.    Hospitalized 12/18 for wide-complex tachycardia which was thought to be related to intermittent pacing because of atrial flutter with blanking Reprogrammed DDIR.  Amiodarone resumed   Had B renal calculi Rx 2/19 w nephrostomy and stenting.  She was glad to get both sides done at the same time.  She is back to exercising.  She has had scant palpitations.  She is tolerating the amiodarone quite well.  There is no cough or nausea.  Shortness of breath is at baseline.  No edema.  Episodes of lightheadedness.  She fell and hit her head and back.  She called me at home in April.  She was referred to the ER.  CT was negative.  She underwent cardioversion.  Much improved thereafter.  Residual orthostatic intolerance.  Has been on amiodarone for a long time.  Suggested using an abdominal binder and avoid pharmacological therapies.  no improvement in orthostatics wth abd binder    Date TSH ALT Hgb  8/18 0.2 15 11.9  9/18                     9.3  3/19 0.722 (12/18) 23 11.2  4/20  18 13.2     StillSOB  Has seen Dr Halford Chessman >Echo>> pulm HTN   I reviewed with DMcL re [facility] who concurred with Dr Collie Siad concern and  communicating with the latter today favored RHC over CPX  LH with prolonged standing and near dizziness and syncope with head extension, either looking up or bending at the waist and looking forward like into the washer. Symptoms abate with standing       DATE TEST    2011 Echo    EF 55 % LAE 40 mm  7/14 Echo    EF 55-60 %   9/18 Echo  EF 55-60%   12/18 Echo  EF 55-60% Severe LAE ?      Remote morbid obesity status>>  post lap band surgery    Thromboembolic risk factors ( age -76 , TIA/CVA-2, Gender-1) for a CHADSVASc Score of 5   Past Medical History:  Diagnosis Date  . Anemia   . Arthritis    osteoarthritis - knees and right shoulder  . Atrial fibrillation [facility])    ablation- 2x's-- 1st time- [facility], 2nd event at [facility] in 2008. Convergent ablation at [facility] 6/14  . Atrial fibrillation ([place])   . Blood transfusion without reported diagnosis   . Breast cancer ([place])    Dr Margot Chimes, total thyroidectomy- 1999- for cancer  . 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 Chronic bilateral pleural effusions   . Colon polyp    Dr Earlean Shawl  . Complete heart block (  [place])   . Complication of anesthesia    Ketamine produces LSD reaction, bright colored nightmarish experience   . Dyslipidemia   . Dyspnea   . Endometriosis   . Fibroids   . H/O pleural effusion    s/p thoracentesis w 3227m withdrawn  . Hepatitis    [place] ?hepatitis   . History of dysphagia    due to radiation therapy  . History of hiatal hernia    small noted on PET scan  . History of kidney stones   . Hx of thyroid cancer    Dr SForde Dandy . Hyperlipidemia   . Hypertension   . Hypothyroidism   . Lung cancer, lower lobe ([place] 01[place]   radiation RX completed 03/04/17; will start chemo 6/27, pt unaware of lung cancer  . Morbid obesity ([place]    Status post lap band surgery  . Nephrolithiasis   . Non Hodgkin's lymphoma ([place]    on chemotherapy  . Persistent atrial fibrillation ([place]    a. s/p PVI 2008  b. s/p convergent ablation 20938complicated by bradycardia requiring pacemaker implant  . Personal history of radiation therapy   . Presence of permanent cardiac pacemaker   . Rotator cuff tear    Right  . Sinus node dysfunction ([place]    Complicating convergent ablation 6/14  . [place]    [place]  . SVC syndrome    with lung mass and non hodgkins lymphoma  . Thyroid cancer ([place] 2000    Past Surgical History:  Procedure Laterality Date  . ABDOMINAL HYSTERECTOMY  1983  . afib ablation     a. 2008 PVI b. 2014 convergent ablation  . APPENDECTOMY    . BONE MARROW BIOPSY  02/21/2017  . BREAST LUMPECTOMY Left 2010  . bso  1998  . CARDIAC CATHETERIZATION     2015- negative  . CARDIOVERSION  10/09/2012   Procedure: CARDIOVERSION;  Surgeon: JMinus Breeding MD;  [place]  Service: Cardiovascular;  Laterality: N/A;  . CARDIOVERSION  10/09/2012   Procedure: CARDIOVERSION;  Surgeon: JMinus Breeding MD;  [facility];  Service: Cardiovascular;  Laterality: N/A;  [name] the ok to add pt to the add on , but we must check to find out if the can add pt on at [phone] ( [phone])  . CARDIOVERSION N/A 11/20/2012   Procedure: CARDIOVERSION;  Surgeon: PFay Records MD;  [facility]  Service: Cardiovascular;  Laterality: N/A;  . CARDIOVERSION N/A 07/18/2017   Procedure: CARDIOVERSION;  Surgeon: NThayer Headings MD;  [facility];  Service: Cardiovascular;  Laterality: N/A;  . CARDIOVERSION N/A 10/03/2017   Procedure: CARDIOVERSION;  Surgeon: CSanda  MD;  [facility]  Service: Cardiovascular;  Laterality: N/A;  . CARDIOVERSION N/A 01/07/2018   Procedure: CARDIOVERSION;  Surgeon: NAcie FredricksonPWonda Cheng MD;  [place]  Service: Cardiovascular;  Laterality: N/A;  . CHOLECYSTECTOMY    . COLONOSCOPY W/ POLYPECTOMY     Dr MEarlean Shawl . CYSTOSCOPY N/A 02/06/2015   Procedure: CYSTOSCOPY;  Surgeon: MKathie Rhodes MD;  [facility];   Service: Urology;  Laterality: N/A;  . CYSTOSCOPY W/ RETROGRADES Left 11/17/2017   Procedure: CYSTOSCOPY WITH RETROGRADE /PYELOGRAM/;  Surgeon: OKathie Rhodes MD;  [facility];  Service: Urology;  Laterality: Left;  . CYSTOSCOPY WITH RETROGRADE PYELOGRAM, URETEROSCOPY AND STENT PLACEMENT Right 02/06/2015   Procedure: RETROGRADE PYELOGRAM, RIGHT URETEROSCOPY STENT PLACEMENT;  Surgeon: MKathie Rhodes  MD;  [facility];  Service: Urology;  Laterality: Right;  . CYSTOSCOPY WITH RETROGRADE PYELOGRAM, URETEROSCOPY AND STENT PLACEMENT Right 03/07/2017   Procedure: CYSTOSCOPY WITH RIGHT RETROGRADE PYELOGRAM,RIGHT  URETEROSCOPYLASER LITHOTRIPSY  AND STENT PLACEMENT AND STONE BASKETRY;  Surgeon: Kathie Rhodes, MD;  Location: [place];  Service: Urology;  Laterality: Right;  . EYE SURGERY     cataract surgery  . fatty mass removal  1999   pubic area  . HOLMIUM LASER APPLICATION N/A 3/55/7322   Procedure: HOLMIUM LASER APPLICATION;  Surgeon: Kathie Rhodes, MD;  [facility];  Service: Urology;  Laterality: N/A;  . HOLMIUM LASER APPLICATION Right 0/25/4270   Procedure: HOLMIUM LASER APPLICATION;  Surgeon: Kathie Rhodes, MD;  Location: [facility];  Service: Urology;  Laterality: Right;  . HOLMIUM LASER APPLICATION Left 03/16/7627   Procedure: HOLMIUM LASER APPLICATION;  Surgeon: Kathie Rhodes, MD;  [facility];  Service: Urology;  Laterality: Left;  . IR FLUORO GUIDE PORT INSERTION RIGHT  02/24/2017  . IR NEPHROSTOMY PLACEMENT RIGHT  11/17/2017  . IR PATIENT EVAL TECH 0-60 MINS  03/11/2017  . IR REMOVAL TUN ACCESS W/ PORT W/O FL MOD SED  04/20/2018  . IR US GUIDE VASC ACCESS RIGHT  02/24/2017  . KNEE ARTHROSCOPY     bilateral  . LAPAROSCOPIC GASTRIC BANDING  07/10/2010  . LAPAROSCOPIC GASTRIC BANDING     Laparoscopic adjustable banding APS System with posterior hiatal hernia, 2 suture.  Marland Kitchen LAPAROTOMY     for ruptured ovary and ovarian artery   . NEPHROLITHOTOMY  Right 11/17/2017   Procedure: NEPHROLITHOTOMY PERCUTANEOUS;  Surgeon: Kathie Rhodes, MD;  [facility];  Service: Urology;  Laterality: Right;  . PACEMAKER INSERTION  03/10/2013   MDT dual chamber PPM  . POCKET REVISION N/A 12/08/2013   Procedure: POCKET REVISION;  Surgeon: Deboraha Sprang, MD;  [facility];  Service: Cardiovascular;  Laterality: N/A;  . PORTA CATH INSERTION    . REVERSE SHOULDER ARTHROPLASTY Right 05/14/2018   Procedure: RIGHT REVERSE SHOULDER ARTHROPLASTY;  Surgeon: Tania Ade, MD;  Location: [place];  Service: Orthopedics;  Laterality: Right;  . REVERSE SHOULDER REPLACEMENT Right 05/14/2018  . TEE WITH CARDIOVERSION  09/22/2017  . TEE WITHOUT CARDIOVERSION N/A 10/03/2017   Procedure: TRANSESOPHAGEAL ECHOCARDIOGRAM (TEE);  Surgeon: Sanda , MD;  Location: [place];  Service: Cardiovascular;  Laterality: N/A;  . THYROIDECTOMY  1998   Dr Margot Chimes  . TONSILLECTOMY    . TOTAL KNEE ARTHROPLASTY  04/13/2012   Procedure: TOTAL KNEE ARTHROPLASTY;  Surgeon: Rudean Haskell, MD;  Location: [place];  Service: Orthopedics;  Laterality: Right;  Marland Kitchen VIDEO BRONCHOSCOPY WITH ENDOBRONCHIAL ULTRASOUND N/A 02/07/2017   Procedure: VIDEO BRONCHOSCOPY WITH ENDOBRONCHIAL ULTRASOUND;  Surgeon: Marshell Garfinkel, MD;  Location: [place];  Service: Pulmonary;  Laterality: N/A;    Current Outpatient Medications  Medication Sig Dispense Refill  . amiodarone (PACERONE) 200 MG tablet Take 1 tablet (200 mg total) by mouth daily. 90 tablet 3  . apixaban (ELIQUIS) 5 MG TABS tablet Take 1 tablet (5 mg total) by mouth 2 (two) times daily. 180 tablet 2  . levothyroxine (SYNTHROID, LEVOTHROID) 175 MCG tablet Take 175 mcg by mouth See admin instructions. TAKE 1 TABLET (175 MCG) BY MOUTH DAILY, EXCEPT FRIDAYS.    Marland Kitchen Lifitegrast (XIIDRA) 5 % SOLN Apply 1 drop to eye at bedtime.     . Multiple Vitamin (MULTIVITAMIN) tablet Take 1 tablet by mouth daily.     Marland Kitchen  rosuvastatin (CRESTOR) 10 MG tablet Take  1 tablet by mouth 2 (two) times a week. Takes on Monday and Thursday     No current facility-administered medications for this visit.     Allergies  Allergen Reactions  . Dofetilide Other (See Comments)    Prolonged QT interval   . Rivaroxaban Other (See Comments)    Nose Bleed X 6 hrs ; packing in ER Nasal hemorrhage  . Epinephrine Rash and Other (See Comments)    Oral anesthetic Dental form only (liquid). Patient stated she will become "out of it" she can hear you but cannot respond in normal fashion. "not fully with it"  . Adhesive [Tape] Rash    Paper tape as well  . Ketamine Other (See Comments)    HALLUCINATIONS    Review of Systems negative except from HPI and PMH  Physical Exam BP 120/84   Pulse 74   Ht _0  (1.676 m)   Wt 228 lb (103.4 kg)   SpO2 98%   BMI 36.80 kg/m   Well developed and well nourished in no acute distress HENT normal Neck supple   Clear Device pocket well healed; without hematoma or erythema.  There is no tethering  Regular rate and rhythm, no  * murmur Abd-soft with active BS No Clubbing cyanosis tr* edema Skin-warm and dry A & Oriented  Grossly normal sensory and motor function  ECG a pacing at 74 with some junctional beats   Previously symptoms have been associated with junctional rhythm   Assessment and  Plan  Atrial fibrillation/flutter  S/p convergent ablation persistent  Prior Strokes  Pacemaker Medtronic MRI compatible   Non-Hodgkin's lymphoma on chemotherapy  Dyspnea on exertion     Treated hypothyroidism  High Risk Medication Surveillance  Junctional rhythm  Symptomatic palpitations question mechanism  Orthostatic hypotension   Dizziness with neck extension    Still with junctional rhythm on interrogation mostly emerging from sinus slowing ( see last in office note) today we have reprogrammed her rate to 70 to see if we can overdrive suppress the junctional rhythm which may be contributing to dizziness    Am also impressed with the story of dizziness assoc with neck extension, standing or bending and relieved by standing and straightening the head. This suggests a neurovascular mechanism.  She has seen Dr Jaynee Eagles and I will copy this on her note as she has seen her in the past   We spent more than 50% of our >25 min visit in face to face counseling regarding the above

## 2019-07-06 NOTE — Patient Instructions (Addendum)
Medication Instructions:  Your physician recommends that you continue on your current medications as directed. Please refer to the Current Medication list given to you today.  Labwork: You will get lab work today:  BMP and CBC  Testing/Procedures: None ordered.  Follow-Up: Your physician wants you to follow-up in: January 2021 with Dr. Caryl Comes.   You will receive a reminder letter in the mail two months in advance. If you don't receive a letter, please call our office to schedule the follow-up appointment.  Remote monitoring is used to monitor your Pacemaker from home. This monitoring reduces the number of office visits required to check your device to one time per year. It allows [place] to keep an eye on the functioning of your device to ensure it is working properly. You are scheduled for a device check from home on 10/05/2019. You may send your transmission at any time that day. If you have a wireless device, the transmission will be sent automatically. After your physician reviews your transmission, you will receive a postcard with your next transmission date.  Any Other Special Instructions Will Be Listed Below (If Applicable).  If you need a refill on your cardiac medications before your next appointment, please call your pharmacy.      [facility] OFFICE [place], [place] [address] Dept: [phone] Loc: [employer]  07/06/2019   You will be scheduled for a covid test 3 days prior to your procedure.   I will call you to schedule a right heart catheterization with Dr. Aundra Dubin.  1. Please arrive at the [facility] (Main Entrance A) at [facility]): [address] [place], [place] [address] (This time is two hours before your procedure to ensure your preparation). Free valet parking service is available.   Special note: Every effort is made to have your  procedure done on time. Please understand that emergencies sometimes delay scheduled procedures.  2. Diet: Do not eat solid foods after midnight.  The patient may have clear liquids until 5am upon the day of the procedure.  3. Labs: TODAY  4. Medication instructions in preparation for your procedure:   You will hold your ELIQUIS for 2 days prior to your procedure.  On the morning of your procedure you will take all your normal morning medications AND a BABY ASPIRIN   You may use sips of water.  5. Plan for one night stay--bring personal belongings. 6. Bring a current list of your medications and current insurance cards. 7. You MUST have a responsible person to drive you home. 8. Someone MUST be with you the first 24 hours after you arrive home or your discharge will be delayed. 9. Please wear clothes that are easy to get on and off and wear slip-on shoes.  Thank you for allowing [place] to care for you!   -- [place] [facility]

## 2019-07-06 NOTE — H&P (View-Only) (Signed)
Patient Care Team: [name], MD as PCP - General (Endocrinology) Deboraha Sprang, MD as PCP - Cardiology (Cardiology)   HPI  Kayla Harrison is a 73 y.o. female Seen in followup for a pacemaker implanted at [facility] and atrial fibrillation.  She underwent pulmonary vein isolation at [facility] and had recurrent atrial fibrillation and underwent convergent ablation at [facility] 9/03; it was complicated by heart block prompting pacemaker implantation.   5/18 presented w SOB and facial fullness, found to have SVC syndrome 2/2 NHlymphoma Rx initially with XRT  Now in remission  She has had recurrent aFib and  was restarted on amio--and then stopped 2/2 worsening SOB  She has a history of prior strokes and remains on oral anticoagulation . She is on apixoban and tolerating well.    Hospitalized 12/18 for wide-complex tachycardia which was thought to be related to intermittent pacing because of atrial flutter with blanking Reprogrammed DDIR.  Amiodarone resumed   Had B renal calculi Rx 2/19 w nephrostomy and stenting.  She was glad to get both sides done at the same time.  She is back to exercising.  She has had scant palpitations.  She is tolerating the amiodarone quite well.  There is no cough or nausea.  Shortness of breath is at baseline.  No edema.  Episodes of lightheadedness.  She fell and hit her head and back.  She called me at home in April.  She was referred to the ER.  CT was negative.  She underwent cardioversion.  Much improved thereafter.  Residual orthostatic intolerance.  Has been on amiodarone for a long time.  Suggested using an abdominal binder and avoid pharmacological therapies.  no improvement in orthostatics wth abd binder    Date TSH ALT Hgb  8/18 0.2 15 11.9  9/18                     9.3  3/19 0.722 (12/18) 23 11.2  4/20  18 13.2     StillSOB  Has seen Dr Halford Chessman >Echo>> pulm HTN   I reviewed with DMcL re [facility] who concurred with Dr Collie Siad concern and  communicating with the latter today favored RHC over CPX  LH with prolonged standing and near dizziness and syncope with head extension, either looking up or bending at the waist and looking forward like into the washer. Symptoms abate with standing       DATE TEST    2011 Echo    EF 55 % LAE 40 mm  7/14 Echo    EF 55-60 %   9/18 Echo  EF 55-60%   12/18 Echo  EF 55-60% Severe LAE ?      Remote morbid obesity status>>  post lap band surgery    Thromboembolic risk factors ( age -76 , TIA/CVA-2, Gender-1) for a CHADSVASc Score of 5   Past Medical History:  Diagnosis Date  . Anemia   . Arthritis    osteoarthritis - knees and right shoulder  . Atrial fibrillation [facility])    ablation- 2x's-- 1st time- [facility], 2nd event at [facility] in 2008. Convergent ablation at [facility] 6/14  . Atrial fibrillation ([place])   . Blood transfusion without reported diagnosis   . Breast cancer ([place])    Dr Margot Chimes, total thyroidectomy- 1999- for cancer  . 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 Chronic bilateral pleural effusions   . Colon polyp    Dr Earlean Shawl  . Complete heart block (  [place])   . Complication of anesthesia    Ketamine produces LSD reaction, bright colored nightmarish experience   . Dyslipidemia   . Dyspnea   . Endometriosis   . Fibroids   . H/O pleural effusion    s/p thoracentesis w 3227m withdrawn  . Hepatitis    [place]hepatitis   . History of dysphagia    due to radiation therapy  . History of hiatal hernia    small noted on PET scan  . History of kidney stones   . Hx of thyroid cancer    Dr SForde Dandy . Hyperlipidemia   . Hypertension   . Hypothyroidism   . Lung cancer, lower lobe ([place] 01[place]   radiation RX completed 03/04/17; will start chemo 6/27, pt unaware of lung cancer  . Morbid obesity ([place]    Status post lap band surgery  . Nephrolithiasis   . Non Hodgkin's lymphoma ([place]    on chemotherapy  . Persistent atrial fibrillation ([place]    a. s/p PVI 2008  b. s/p convergent ablation 20938complicated by bradycardia requiring pacemaker implant  . Personal history of radiation therapy   . Presence of permanent cardiac pacemaker   . Rotator cuff tear    Right  . Sinus node dysfunction ([place]    Complicating convergent ablation 6/14  . [place]    [place]  . SVC syndrome    with lung mass and non hodgkins lymphoma  . Thyroid cancer ([place] 2000    Past Surgical History:  Procedure Laterality Date  . ABDOMINAL HYSTERECTOMY  1983  . afib ablation     a. 2008 PVI b. 2014 convergent ablation  . APPENDECTOMY    . BONE MARROW BIOPSY  02/21/2017  . BREAST LUMPECTOMY Left 2010  . bso  1998  . CARDIAC CATHETERIZATION     2015- negative  . CARDIOVERSION  10/09/2012   Procedure: CARDIOVERSION;  Surgeon: JMinus Breeding MD;  [place]  Service: Cardiovascular;  Laterality: N/A;  . CARDIOVERSION  10/09/2012   Procedure: CARDIOVERSION;  Surgeon: JMinus Breeding MD;  [facility]  Service: Cardiovascular;  Laterality: N/A;  [name] the ok to add pt to the add on , but we must check to find out if the can add pt on at [phone] ( [phone])  . CARDIOVERSION N/A 11/20/2012   Procedure: CARDIOVERSION;  Surgeon: PFay Records MD;  [facility]  Service: Cardiovascular;  Laterality: N/A;  . CARDIOVERSION N/A 07/18/2017   Procedure: CARDIOVERSION;  Surgeon: NThayer Headings MD;  [facility];  Service: Cardiovascular;  Laterality: N/A;  . CARDIOVERSION N/A 10/03/2017   Procedure: CARDIOVERSION;  Surgeon: CSanda  MD;  [facility]  Service: Cardiovascular;  Laterality: N/A;  . CARDIOVERSION N/A 01/07/2018   Procedure: CARDIOVERSION;  Surgeon: NAcie FredricksonPWonda Cheng MD;  [place]  Service: Cardiovascular;  Laterality: N/A;  . CHOLECYSTECTOMY    . COLONOSCOPY W/ POLYPECTOMY     Dr MEarlean Shawl . CYSTOSCOPY N/A 02/06/2015   Procedure: CYSTOSCOPY;  Surgeon: MKathie Rhodes MD;  [facility];   Service: Urology;  Laterality: N/A;  . CYSTOSCOPY W/ RETROGRADES Left 11/17/2017   Procedure: CYSTOSCOPY WITH RETROGRADE /PYELOGRAM/;  Surgeon: OKathie Rhodes MD;  [facility];  Service: Urology;  Laterality: Left;  . CYSTOSCOPY WITH RETROGRADE PYELOGRAM, URETEROSCOPY AND STENT PLACEMENT Right 02/06/2015   Procedure: RETROGRADE PYELOGRAM, RIGHT URETEROSCOPY STENT PLACEMENT;  Surgeon: MKathie Rhodes  MD;  [facility];  Service: Urology;  Laterality: Right;  . CYSTOSCOPY WITH RETROGRADE PYELOGRAM, URETEROSCOPY AND STENT PLACEMENT Right 03/07/2017   Procedure: CYSTOSCOPY WITH RIGHT RETROGRADE PYELOGRAM,RIGHT  URETEROSCOPYLASER LITHOTRIPSY  AND STENT PLACEMENT AND STONE BASKETRY;  Surgeon: Kathie Rhodes, MD;  Location: [place];  Service: Urology;  Laterality: Right;  . EYE SURGERY     cataract surgery  . fatty mass removal  1999   pubic area  . HOLMIUM LASER APPLICATION N/A 3/55/7322   Procedure: HOLMIUM LASER APPLICATION;  Surgeon: Kathie Rhodes, MD;  [facility];  Service: Urology;  Laterality: N/A;  . HOLMIUM LASER APPLICATION Right 0/25/4270   Procedure: HOLMIUM LASER APPLICATION;  Surgeon: Kathie Rhodes, MD;  Location: [facility];  Service: Urology;  Laterality: Right;  . HOLMIUM LASER APPLICATION Left 03/16/7627   Procedure: HOLMIUM LASER APPLICATION;  Surgeon: Kathie Rhodes, MD;  [facility];  Service: Urology;  Laterality: Left;  . IR FLUORO GUIDE PORT INSERTION RIGHT  02/24/2017  . IR NEPHROSTOMY PLACEMENT RIGHT  11/17/2017  . IR PATIENT EVAL TECH 0-60 MINS  03/11/2017  . IR REMOVAL TUN ACCESS W/ PORT W/O FL MOD SED  04/20/2018  . IR US GUIDE VASC ACCESS RIGHT  02/24/2017  . KNEE ARTHROSCOPY     bilateral  . LAPAROSCOPIC GASTRIC BANDING  07/10/2010  . LAPAROSCOPIC GASTRIC BANDING     Laparoscopic adjustable banding APS System with posterior hiatal hernia, 2 suture.  Marland Kitchen LAPAROTOMY     for ruptured ovary and ovarian artery   . NEPHROLITHOTOMY  Right 11/17/2017   Procedure: NEPHROLITHOTOMY PERCUTANEOUS;  Surgeon: Kathie Rhodes, MD;  [facility];  Service: Urology;  Laterality: Right;  . PACEMAKER INSERTION  03/10/2013   MDT dual chamber PPM  . POCKET REVISION N/A 12/08/2013   Procedure: POCKET REVISION;  Surgeon: Deboraha Sprang, MD;  [facility];  Service: Cardiovascular;  Laterality: N/A;  . PORTA CATH INSERTION    . REVERSE SHOULDER ARTHROPLASTY Right 05/14/2018   Procedure: RIGHT REVERSE SHOULDER ARTHROPLASTY;  Surgeon: Tania Ade, MD;  Location: [place];  Service: Orthopedics;  Laterality: Right;  . REVERSE SHOULDER REPLACEMENT Right 05/14/2018  . TEE WITH CARDIOVERSION  09/22/2017  . TEE WITHOUT CARDIOVERSION N/A 10/03/2017   Procedure: TRANSESOPHAGEAL ECHOCARDIOGRAM (TEE);  Surgeon: Sanda Sevana Grandinetti, MD;  Location: [place];  Service: Cardiovascular;  Laterality: N/A;  . THYROIDECTOMY  1998   Dr Margot Chimes  . TONSILLECTOMY    . TOTAL KNEE ARTHROPLASTY  04/13/2012   Procedure: TOTAL KNEE ARTHROPLASTY;  Surgeon: Rudean Haskell, MD;  Location: [place];  Service: Orthopedics;  Laterality: Right;  Marland Kitchen VIDEO BRONCHOSCOPY WITH ENDOBRONCHIAL ULTRASOUND N/A 02/07/2017   Procedure: VIDEO BRONCHOSCOPY WITH ENDOBRONCHIAL ULTRASOUND;  Surgeon: Marshell Garfinkel, MD;  Location: [place];  Service: Pulmonary;  Laterality: N/A;    Current Outpatient Medications  Medication Sig Dispense Refill  . amiodarone (PACERONE) 200 MG tablet Take 1 tablet (200 mg total) by mouth daily. 90 tablet 3  . apixaban (ELIQUIS) 5 MG TABS tablet Take 1 tablet (5 mg total) by mouth 2 (two) times daily. 180 tablet 2  . levothyroxine (SYNTHROID, LEVOTHROID) 175 MCG tablet Take 175 mcg by mouth See admin instructions. TAKE 1 TABLET (175 MCG) BY MOUTH DAILY, EXCEPT FRIDAYS.    Marland Kitchen Lifitegrast (XIIDRA) 5 % SOLN Apply 1 drop to eye at bedtime.     . Multiple Vitamin (MULTIVITAMIN) tablet Take 1 tablet by mouth daily.     Marland Kitchen  rosuvastatin (CRESTOR) 10 MG tablet Take  1 tablet by mouth 2 (two) times a week. Takes on Monday and Thursday     No current facility-administered medications for this visit.     Allergies  Allergen Reactions  . Dofetilide Other (See Comments)    Prolonged QT interval   . Rivaroxaban Other (See Comments)    Nose Bleed X 6 hrs ; packing in ER Nasal hemorrhage  . Epinephrine Rash and Other (See Comments)    Oral anesthetic Dental form only (liquid). Patient stated she will become "out of it" she can hear you but cannot respond in normal fashion. "not fully with it"  . Adhesive [Tape] Rash    Paper tape as well  . Ketamine Other (See Comments)    HALLUCINATIONS    Review of Systems negative except from HPI and PMH  Physical Exam BP 120/84   Pulse 74   Ht _0  (1.676 m)   Wt 228 lb (103.4 kg)   SpO2 98%   BMI 36.80 kg/m   Well developed and well nourished in no acute distress HENT normal Neck supple   Clear Device pocket well healed; without hematoma or erythema.  There is no tethering  Regular rate and rhythm, no  * murmur Abd-soft with active BS No Clubbing cyanosis tr* edema Skin-warm and dry A & Oriented  Grossly normal sensory and motor function  ECG a pacing at 74 with some junctional beats   Previously symptoms have been associated with junctional rhythm   Assessment and  Plan  Atrial fibrillation/flutter  S/p convergent ablation persistent  Prior Strokes  Pacemaker Medtronic MRI compatible   Non-Hodgkin's lymphoma on chemotherapy  Dyspnea on exertion     Treated hypothyroidism  High Risk Medication Surveillance  Junctional rhythm  Symptomatic palpitations question mechanism  Orthostatic hypotension   Dizziness with neck extension    Still with junctional rhythm on interrogation mostly emerging from sinus slowing ( see last in office note) today we have reprogrammed her rate to 70 to see if we can overdrive suppress the junctional rhythm which may be contributing to dizziness    Am also impressed with the story of dizziness assoc with neck extension, standing or bending and relieved by standing and straightening the head. This suggests a neurovascular mechanism.  She has seen Dr Jaynee Eagles and I will copy this on her note as she has seen her in the past   We spent more than 50% of our >25 min visit in face to face counseling regarding the above

## 2019-07-07 ENCOUNTER — Other Ambulatory Visit: Payer: Self-pay | Admitting: Internal Medicine

## 2019-07-07 DIAGNOSIS — I272 Pulmonary hypertension, unspecified: Secondary | ICD-10-CM

## 2019-07-07 LAB — CBC WITH DIFFERENTIAL/PLATELET
Basophils Absolute: 0 10*3/uL (ref 0.0–0.2)
Basos: 1 %
EOS (ABSOLUTE): 0.1 10*3/uL (ref 0.0–0.4)
Eos: 2 %
Hematocrit: 39.9 % (ref 34.0–46.6)
Hemoglobin: 13 g/dL (ref 11.1–15.9)
Immature Grans (Abs): 0 10*3/uL (ref 0.0–0.1)
Immature Granulocytes: 0 %
Lymphocytes Absolute: 0.8 10*3/uL (ref 0.7–3.1)
Lymphs: 13 %
MCH: 30 pg (ref 26.6–33.0)
MCHC: 32.6 g/dL (ref 31.5–35.7)
MCV: 92 fL (ref 79–97)
Monocytes Absolute: 0.7 10*3/uL (ref 0.1–0.9)
Monocytes: 11 %
Neutrophils Absolute: 4.4 10*3/uL (ref 1.4–7.0)
Neutrophils: 73 %
Platelets: 192 10*3/uL (ref 150–450)
RBC: 4.33 x10E6/uL (ref 3.77–5.28)
RDW: 13.8 % (ref 11.7–15.4)
WBC: 6 10*3/uL (ref 3.4–10.8)

## 2019-07-07 LAB — BASIC METABOLIC PANEL
BUN/Creatinine Ratio: 23 (ref 12–28)
BUN: 21 mg/dL (ref 8–27)
CO2: 23 mmol/L (ref 20–29)
Calcium: 9.5 mg/dL (ref 8.7–10.3)
Chloride: 105 mmol/L (ref 96–106)
Creatinine, Ser: 0.91 mg/dL (ref 0.57–1.00)
GFR calc Af Amer: 71 mL/min/{1.73_m2} (ref >59)
GFR calc non Af Amer: 62 mL/min/{1.73_m2} (ref >59)
Glucose: 93 mg/dL (ref 65–99)
Potassium: 4.7 mmol/L (ref 3.5–5.2)
Sodium: 141 mmol/L (ref 134–144)

## 2019-07-07 MED ORDER — SODIUM CHLORIDE 0.9% FLUSH: 3.0000 mL | Freq: Two times a day (BID) | INTRAVENOUS | Status: DC

## 2019-07-09 DIAGNOSIS — M25611 Stiffness of right shoulder, not elsewhere classified: Secondary | ICD-10-CM | POA: Diagnosis not present

## 2019-07-09 DIAGNOSIS — M6281 Muscle weakness (generalized): Secondary | ICD-10-CM | POA: Diagnosis not present

## 2019-07-09 DIAGNOSIS — S46911D Strain of unspecified muscle, fascia and tendon at shoulder and upper arm level, right arm, subsequent encounter: Secondary | ICD-10-CM | POA: Diagnosis not present

## 2019-07-12 ENCOUNTER — Other Ambulatory Visit: Payer: Self-pay | Admitting: Internal Medicine

## 2019-07-12 NOTE — Telephone Encounter (Signed)
Eliquis 5mg  refill request received, pt is 73 yrs old, weight-103.4kg, Crea-0.91 on 07/06/2019, Diagnosis-Afib, and last seen by Dr. Caryl Comes on 07/06/2019. Dose is appropriate based on dosing criteria. Will send in refill to requested pharmacy.

## 2019-07-14 ENCOUNTER — Telehealth: Payer: Self-pay

## 2019-07-14 DIAGNOSIS — M6281 Muscle weakness (generalized): Secondary | ICD-10-CM | POA: Diagnosis not present

## 2019-07-14 DIAGNOSIS — S46911D Strain of unspecified muscle, fascia and tendon at shoulder and upper arm level, right arm, subsequent encounter: Secondary | ICD-10-CM | POA: Diagnosis not present

## 2019-07-14 DIAGNOSIS — M25611 Stiffness of right shoulder, not elsewhere classified: Secondary | ICD-10-CM | POA: Diagnosis not present

## 2019-07-14 NOTE — Telephone Encounter (Signed)
Spoke to patient 10/21

## 2019-07-14 NOTE — Telephone Encounter (Signed)
I spoke to the patient and reviewed her upcoming appointments.  She verbalized understanding.

## 2019-07-17 ENCOUNTER — Other Ambulatory Visit (HOSPITAL_COMMUNITY): Payer: Medicare Other

## 2019-07-19 ENCOUNTER — Other Ambulatory Visit (HOSPITAL_COMMUNITY)
Admission: RE | Admit: 2019-07-19 | Discharge: 2019-07-19 | Disposition: A | Discharge status: Home / Self Care | Payer: Medicare Other | Source: Ambulatory Visit | Attending: Cardiology | Admitting: Cardiology

## 2019-07-19 ENCOUNTER — Ambulatory Visit: Payer: Medicare Other | Admitting: Pulmonary Disease

## 2019-07-19 DIAGNOSIS — Z01812 Encounter for preprocedural laboratory examination: Secondary | ICD-10-CM | POA: Insufficient documentation

## 2019-07-19 DIAGNOSIS — Z20828 Contact with and (suspected) exposure to other viral communicable diseases: Secondary | ICD-10-CM | POA: Diagnosis not present

## 2019-07-19 LAB — SARS CORONAVIRUS 2 (TAT 6-24 HRS): SARS Coronavirus 2: NEGATIVE

## 2019-07-20 ENCOUNTER — Telehealth: Payer: Self-pay | Admitting: *Deleted

## 2019-07-20 NOTE — Telephone Encounter (Addendum)
Pt contacted pre-RHC scheduled at [facility] for: Wednesday July 21, 2019 10 AM Verified arrival time and place: [place] [facility]) at: 8 AM   No solid food after midnight prior to cath, clear liquids until 5 AM day of procedure.  AM meds can be taken pre-cath with sip of water including: RHC only-pt instructions 07/06/19-not necessary to take ASA 81 mg AM of procedure.   Confirmed patient has responsible adult to drive home post procedure and observe 24 hours after arriving home: no  Currently, due to Covid-19 pandemic, only one support person will be allowed with patient. Must be the same support person for that patient's entire stay, will be screened and required to wear a mask. They will be asked to wait in the waiting room for the duration of the patient's stay.  Patients are required to wear a mask when they enter the hospital.      COVID-19 Pre-Screening Questions:  . In the past 7 to 10 days have you had a cough,  shortness of breath, headache, congestion, fever (100 or greater) body aches, chills, sore throat, or sudden loss of taste or sense of smell? no . Have you been around anyone with known Covid 19? no . Have you been around anyone who is awaiting Covid 19 test results in the past 7 to 10 days? no . Have you been around anyone who has been exposed to Covid 19, or has mentioned symptoms of Covid 19 within the past 7 to 10 days? no    I reviewed procedure/mask/visitor instructions, Covid-19 screening questions with patient, she verbalized understanding, thanked me for call.

## 2019-07-21 ENCOUNTER — Encounter (HOSPITAL_COMMUNITY)
Admission: RE | Disposition: A | Discharge status: Home / Self Care | Payer: Self-pay | Source: Home / Self Care | Attending: Cardiology

## 2019-07-21 ENCOUNTER — Other Ambulatory Visit: Payer: Self-pay

## 2019-07-21 ENCOUNTER — Ambulatory Visit (HOSPITAL_COMMUNITY)
Admission: RE | Admit: 2019-07-21 | Discharge: 2019-07-21 | Disposition: A | Discharge status: Home / Self Care | Payer: Medicare Other | Attending: Cardiology | Admitting: Cardiology

## 2019-07-21 ENCOUNTER — Telehealth (HOSPITAL_COMMUNITY): Payer: Self-pay

## 2019-07-21 ENCOUNTER — Encounter (HOSPITAL_COMMUNITY): Payer: Self-pay | Admitting: Cardiology

## 2019-07-21 DIAGNOSIS — E039 Hypothyroidism, unspecified: Secondary | ICD-10-CM | POA: Insufficient documentation

## 2019-07-21 DIAGNOSIS — R0609 Other forms of dyspnea: Secondary | ICD-10-CM | POA: Insufficient documentation

## 2019-07-21 DIAGNOSIS — Z85850 Personal history of malignant neoplasm of thyroid: Secondary | ICD-10-CM | POA: Insufficient documentation

## 2019-07-21 DIAGNOSIS — I4891 Unspecified atrial fibrillation: Secondary | ICD-10-CM | POA: Diagnosis not present

## 2019-07-21 DIAGNOSIS — Z950 Presence of cardiac pacemaker: Secondary | ICD-10-CM | POA: Diagnosis not present

## 2019-07-21 DIAGNOSIS — Z853 Personal history of malignant neoplasm of breast: Secondary | ICD-10-CM | POA: Insufficient documentation

## 2019-07-21 DIAGNOSIS — E785 Hyperlipidemia, unspecified: Secondary | ICD-10-CM | POA: Insufficient documentation

## 2019-07-21 DIAGNOSIS — R0600 Dyspnea, unspecified: Secondary | ICD-10-CM | POA: Diagnosis not present

## 2019-07-21 DIAGNOSIS — R42 Dizziness and giddiness: Secondary | ICD-10-CM | POA: Diagnosis not present

## 2019-07-21 DIAGNOSIS — I4892 Unspecified atrial flutter: Secondary | ICD-10-CM | POA: Insufficient documentation

## 2019-07-21 DIAGNOSIS — Z6836 Body mass index (BMI) 36.0-36.9, adult: Secondary | ICD-10-CM | POA: Insufficient documentation

## 2019-07-21 DIAGNOSIS — Z7901 Long term (current) use of anticoagulants: Secondary | ICD-10-CM | POA: Diagnosis not present

## 2019-07-21 DIAGNOSIS — C8590 Non-Hodgkin lymphoma, unspecified, unspecified site: Secondary | ICD-10-CM | POA: Diagnosis not present

## 2019-07-21 DIAGNOSIS — M1990 Unspecified osteoarthritis, unspecified site: Secondary | ICD-10-CM | POA: Insufficient documentation

## 2019-07-21 DIAGNOSIS — Z79899 Other long term (current) drug therapy: Secondary | ICD-10-CM | POA: Insufficient documentation

## 2019-07-21 DIAGNOSIS — I951 Orthostatic hypotension: Secondary | ICD-10-CM | POA: Diagnosis not present

## 2019-07-21 DIAGNOSIS — Z8673 Personal history of transient ischemic attack (TIA), and cerebral infarction without residual deficits: Secondary | ICD-10-CM | POA: Diagnosis not present

## 2019-07-21 DIAGNOSIS — Z79890 Hormone replacement therapy (postmenopausal): Secondary | ICD-10-CM | POA: Diagnosis not present

## 2019-07-21 DIAGNOSIS — I272 Pulmonary hypertension, unspecified: Secondary | ICD-10-CM | POA: Diagnosis not present

## 2019-07-21 DIAGNOSIS — Z888 Allergy status to other drugs, medicaments and biological substances status: Secondary | ICD-10-CM | POA: Insufficient documentation

## 2019-07-21 DIAGNOSIS — Z884 Allergy status to anesthetic agent status: Secondary | ICD-10-CM | POA: Diagnosis not present

## 2019-07-21 HISTORY — PX: RIGHT HEART CATH: CATH118263

## 2019-07-21 LAB — POCT I-STAT EG7
Bicarbonate: 24.9 mmol/L (ref 20.0–28.0)
Calcium, Ion: 1.19 mmol/L (ref 1.15–1.40)
HCT: 35 % — ABNORMAL LOW (ref 36.0–46.0)
Hemoglobin: 11.9 g/dL — ABNORMAL LOW (ref 12.0–15.0)
O2 Saturation: 70 %
Potassium: 4 mmol/L (ref 3.5–5.1)
Sodium: 141 mmol/L (ref 135–145)
TCO2: 26 mmol/L (ref 22–32)
pCO2, Ven: 42.6 mmHg — ABNORMAL LOW (ref 44.0–60.0)
pH, Ven: 7.375 (ref 7.250–7.430)
pO2, Ven: 38 mmHg (ref 32.0–45.0)

## 2019-07-21 SURGERY — RIGHT HEART CATH
Anesthesia: LOCAL

## 2019-07-21 MED ORDER — SODIUM CHLORIDE 0.9 % IV SOLN: 250.0000 mL | INTRAVENOUS | Status: DC | PRN

## 2019-07-21 MED ORDER — ONDANSETRON HCL 4 MG/2ML IJ SOLN: 4.0000 mg | Freq: Four times a day (QID) | INTRAMUSCULAR | Status: DC | PRN

## 2019-07-21 MED ORDER — ACETAMINOPHEN 325 MG PO TABS: 650.0000 mg | ORAL_TABLET | ORAL | Status: DC | PRN

## 2019-07-21 MED ORDER — SODIUM CHLORIDE 0.9% FLUSH: 3.0000 mL | INTRAVENOUS | Status: DC | PRN

## 2019-07-21 MED ORDER — IOHEXOL 350 MG/ML SOLN
INTRAVENOUS | Status: DC | PRN
  Administered 2019-07-21: 11:00:00 10 mL via INTRAVENOUS

## 2019-07-21 MED ORDER — HYDRALAZINE HCL 20 MG/ML IJ SOLN: 10.0000 mg | INTRAMUSCULAR | Status: DC | PRN

## 2019-07-21 MED ORDER — HEPARIN (PORCINE) IN NACL 1000-0.9 UT/500ML-% IV SOLN
INTRAVENOUS | Status: DC | PRN
  Administered 2019-07-21: 500 mL

## 2019-07-21 MED ORDER — ASPIRIN 81 MG PO CHEW: 81.0000 mg | CHEWABLE_TABLET | ORAL | Status: DC

## 2019-07-21 MED ORDER — SODIUM CHLORIDE 0.9 % IV SOLN: INTRAVENOUS | Status: DC

## 2019-07-21 MED ORDER — LABETALOL HCL 5 MG/ML IV SOLN: 10.0000 mg | INTRAVENOUS | Status: DC | PRN

## 2019-07-21 MED ORDER — LIDOCAINE HCL (PF) 1 % IJ SOLN
INTRAMUSCULAR | Status: AC
  Filled 2019-07-21: qty 30

## 2019-07-21 MED ORDER — LIDOCAINE HCL (PF) 1 % IJ SOLN
INTRAMUSCULAR | Status: DC | PRN
  Administered 2019-07-21: 2 mL via INTRADERMAL

## 2019-07-21 MED ORDER — SODIUM CHLORIDE 0.9% FLUSH: 3.0000 mL | Freq: Two times a day (BID) | INTRAVENOUS | Status: DC

## 2019-07-21 MED ORDER — HEPARIN (PORCINE) IN NACL 1000-0.9 UT/500ML-% IV SOLN
INTRAVENOUS | Status: AC
  Filled 2019-07-21: qty 500

## 2019-07-21 SURGICAL SUPPLY — 6 items
CATH BALLN WEDGE 5F 110CM (CATHETERS) ×2 IMPLANT
GUIDEWIRE .025 260CM (WIRE) ×2 IMPLANT
PACK CARDIAC CATHETERIZATION (CUSTOM PROCEDURE TRAY) ×2 IMPLANT
SHEATH GLIDE SLENDER 4/5FR (SHEATH) ×4 IMPLANT
SHEATH PROBE COVER 6X72 (BAG) ×2 IMPLANT
TRANSDUCER W/STOPCOCK (MISCELLANEOUS) ×2 IMPLANT

## 2019-07-21 NOTE — Discharge Instructions (Signed)
Venogram, Care After °This sheet gives you information about how to care for yourself after your procedure. Your health care provider may also give you more specific instructions. If you have problems or questions, contact your health care provider. °What can I expect after the procedure? °After the procedure, it is common to have: °· Bruising or mild discomfort in the area where the IV was inserted (insertion site). °Follow these instructions at home: °Eating and drinking ° °· Follow instructions from your health care provider about eating or drinking restrictions. °· Drink a lot of fluids for the first several days after the procedure, as directed by your health care provider. This helps to wash (flush) the contrast out of your body. Examples of healthy fluids include water or low-calorie drinks. °General instructions °· Check your IV insertion area every day for signs of infection. Check for: °? Redness, swelling, or pain. °? Fluid or blood. °? Warmth. °? Pus or a bad smell. °· Take over-the-counter and prescription medicines only as told by your health care provider. °· Rest and return to your normal activities as told by your health care provider. Ask your health care provider what activities are safe for you. °· Do not drive for 24 hours if you were given a medicine to help you relax (sedative), or until your health care provider approves. °· Keep all follow-up visits as told by your health care provider. This is important. °Contact a health care provider if: °· Your skin becomes itchy or you develop a rash or hives. °· You have a fever that does not get better with medicine. °· You feel nauseous. °· You vomit. °· You have redness, swelling, or pain around the insertion site. °· You have fluid or blood coming from the insertion site. °· Your insertion area feels warm to the touch. °· You have pus or a bad smell coming from the insertion site. °Get help right away if: °· You have difficulty breathing or  shortness of breath. °· You develop chest pain. °· You faint. °· You feel very dizzy. °These symptoms may represent a serious problem that is an emergency. Do not wait to see if the symptoms will go away. Get medical help right away. Call your local emergency services (911 in the [place] Do not drive yourself to the hospital. °Summary °· After your procedure, it is common to have bruising or mild discomfort in the area where the IV was inserted. °· You should check your IV insertion area every day for signs of infection. °· Take over-the-counter and prescription medicines only as told by your health care provider. °· You should drink a lot of fluids for the first several days after the procedure to help flush the contrast from your body. °This information is not intended to replace advice given to you by your health care provider. Make sure you discuss any questions you have with your health care provider. °Document Released: 06/30/2013 Document Revised: 08/22/2017 Document Reviewed: 08/03/2016 °Elsevier Patient Education © 2020 [employer] ° °

## 2019-07-21 NOTE — Telephone Encounter (Signed)
Left message to return call to schedule work in appt per Loralie Champagne within the next 2 weeks as well as a lab appt

## 2019-07-21 NOTE — Interval H&P Note (Signed)
History and Physical Interval Note:  07/21/2019 10:46 AM  Kayla Harrison  has presented today for surgery, with the diagnosis of Shortness of breath.  The various methods of treatment have been discussed with the patient and family. After consideration of risks, benefits and other options for treatment, the patient has consented to  Procedure(s): RIGHT HEART CATH (N/A) as a surgical intervention.  The patient's history has been reviewed, patient examined, no change in status, stable for surgery.  I have reviewed the patient's chart and labs.  Questions were answered to the patient's satisfaction.     Carleta Woodrow [employer]

## 2019-07-23 DIAGNOSIS — M25461 Effusion, right knee: Secondary | ICD-10-CM | POA: Diagnosis not present

## 2019-07-23 DIAGNOSIS — L03115 Cellulitis of right lower limb: Secondary | ICD-10-CM | POA: Diagnosis not present

## 2019-07-23 NOTE — Telephone Encounter (Signed)
Patient left message on triage line requested an apt with DM after cath as stated by Dr Aundra Dubin will need in 1-2 weeks post cath  Detailed message left for patient that a "work in" appt is availablel 11/12 @ 1020, pt instructed to return call if this appt date and time works for her.

## 2019-07-26 ENCOUNTER — Other Ambulatory Visit: Payer: Self-pay

## 2019-07-26 ENCOUNTER — Ambulatory Visit (INDEPENDENT_AMBULATORY_CARE_PROVIDER_SITE_OTHER): Payer: Medicare Other | Admitting: Pulmonary Disease

## 2019-07-26 ENCOUNTER — Encounter: Payer: Self-pay | Admitting: Pulmonary Disease

## 2019-07-26 VITALS — BP 110/68 | HR 72 | Temp 97.6°F | Ht 66.0 in | Wt 225.2 lb

## 2019-07-26 DIAGNOSIS — R0600 Dyspnea, unspecified: Secondary | ICD-10-CM | POA: Diagnosis not present

## 2019-07-26 DIAGNOSIS — R0609 Other forms of dyspnea: Secondary | ICD-10-CM

## 2019-07-26 DIAGNOSIS — R0683 Snoring: Secondary | ICD-10-CM | POA: Diagnosis not present

## 2019-07-26 DIAGNOSIS — I272 Pulmonary hypertension, unspecified: Secondary | ICD-10-CM | POA: Diagnosis not present

## 2019-07-26 NOTE — Patient Instructions (Signed)
Will arrange for home sleep study Will call to arrange for follow up after sleep study reviewed  

## 2019-07-26 NOTE — Progress Notes (Signed)
[facility]  Chief Complaint  Patient presents with  . Pulmonary hypertension    Constitutional:  BP 110/68 (BP Location: Left Arm, Patient Position: Sitting, Cuff Size: Large)   Pulse 72   Temp 97.6 F (36.4 C)   Ht _0  (1.676 m)   Wt 225 lb 3.2 oz (102.2 kg)   SpO2 96% Comment: on room air  BMI 36.35 kg/m   Past Medical History:  Thyroid cancer, NHL, Sinus node dysfunction s/p ablation and PM, CVA, A fib, Nephrolithiasis, HTN, Hiatal hernia, Brucellosis, Endometriosis, HLD, Colon polyp, OA, Breast cancer  Brief Summary:  Kayla Harrison is a 73 y.o. female with dyspnea, dry cough.  She has hx of non Hodgkin's lymphoma s/p R-CHOP from 04/02/17 to 07/16/17.  She had RHC on 07/21/19 with Dr. Aundra Dubin.  Was more in line diastolic dysfunction or valvular heart disease.  She hasn't been very active recently after she broke her ankle.  She was doing water aerobics and light weight training before.  She still gets winded when walking to the mailbox.  Lost a couple bounds since being on lasix.  Not having cough, fever, or sputum.  She snores and has trouble sleeping on her back.  She does get sleepy during the day at times.  Physical Exam:   Appearance - well kempt   ENMT - no sinus tenderness, no nasal discharge, no oral exudate  Neck - no masses, trachea midline, no thyromegaly, no elevation in JVP  Respiratory - normal appearance of chest wall, normal respiratory effort w/o accessory muscle use, no dullness on percussion, no wheezing or rales  CV - s1s2 regular rate and rhythm, 2/6 murmurs, 1+ peripheral edema, radial pulses symmetric  GI - soft, non tender  Lymph - no adenopathy noted in neck and axillary areas  MSK - normal gait  Ext - no cyanosis, clubbing, or joint inflammation noted  Skin - no rashes, lesions, or ulcers  Neuro - normal strength, oriented x 3  Psych - normal mood and affect   Assessment/Plan:    Dyspnea on exertion. - likely from diastolic dysfunction and valvular heart disease with pulmonary hypertension, and deconditioning - encouraged her to resume gradual exercise regimen  Pulmonary hypertension. - in setting of diastolic heart failure and mitral valve disease - f/u with Dr. Caryl Comes and Dr. Aundra Dubin with cardiology  Snoring. - will arrange for home sleep study to assess for obstructive sleep apnea  Right lower lung nodular cluster. - likely benign  - no additional imaging needed for this  Small, recurrent Rt sided effusion. - monitor clinically for now - not likely contributing to symptoms at present   Patient Instructions  Will arrange for home sleep study Will call to arrange for follow up after sleep study reviewed   A total of  28 minutes were spent face to face with the patient and more than half of that time involved counseling or coordination of care.   Chesley Mires, MD [facility] Pager: [phone] 07/26/2019, 10:08 AM  Flow Sheet     Pulmonary tests:  PFT 01/20/13 >> FEV1 2.97 (118%), FEV1% 86, TLC 5.91 (110%), DLCO 82% PFT 05/12/19 >> FEV1 2.68 (114%), FEV1% 86, TLC 4.55 (85%), DLCO 61%  Chest imaging:  CT angio chest 09/15/17 >> b/l pleural effusions, radiation fibrosis RUL CT chest 528/19 >>  stable 6 mm LLL nodule, patchy nodular consolidation RLL, stable 3 mm RUL nodule, stable Rt paramediastinal post radiation changes. CT  chest 05/29/18 >> XRT changes paramediastium, decreased size of nodular cluster RLL, tiny Rt effusion CT chest 04/02/19 >> mild CM, atherosclerosis, s/p thyroidectomy, paramediastinal XRT changes b/l Rt > Lt, nodular cluster RLL decreased in size, small Rt effusion  Cardiac tests:  Echo 06/16/19 >> EF 50 to 55%, mod RV enlargement, mild MR, mild MS, mod TR, mod elevation in PASP, RVSP 45.5 mmHg RHC 07/21/19 >> PA mean 6, RV 57/7, PA 58/23 mean 40, PCWP 23, prominent V wave, CI 2.51, PVR 3.25 WU  Medications:    Allergies as of 07/26/2019      Reactions   Dofetilide Other (See Comments)   Prolonged QT interval   Rivaroxaban Other (See Comments)   Nose Bleed X 6 hrs ; packing in ER Nasal hemorrhage   Epinephrine Other (See Comments)   Oral anesthetic Dental form only (liquid). Patient stated she will become "out of it" she can hear you but cannot respond in normal fashion. "not fully with it"   Adhesive [tape] Rash   Paper tape as well   Ketamine Other (See Comments)   HALLUCINATIONS      Medication List       Accurate as of July 26, 2019 10:08 AM. If you have any questions, ask your nurse or doctor.        acetaminophen 500 MG tablet Commonly known as: TYLENOL Take 1,000 mg by mouth every 6 (six) hours as needed for moderate pain or headache.   amiodarone 200 MG tablet Commonly known as: PACERONE Take 1 tablet (200 mg total) by mouth daily.   doxycycline 100 MG tablet Commonly known as: VIBRA-TABS Take 100 mg by mouth 2 (two) times daily.   Eliquis 5 MG Tabs tablet Generic drug: apixaban TAKE 1 TABLET BY MOUTH  TWICE DAILY What changed: how much to take   furosemide 20 MG tablet Commonly known as: LASIX Take 20 mg by mouth daily.   gabapentin 100 MG capsule Commonly known as: NEURONTIN Take 100-200 mg by mouth 3 (three) times daily as needed (pain).   levothyroxine 137 MCG tablet Commonly known as: SYNTHROID Take 137 mcg by mouth See admin instructions. TAKE 1 TABLET (137 MCG) BY MOUTH DAILY, EXCEPT SATURDAYS   potassium chloride 10 MEQ tablet Commonly known as: KLOR-CON Take 10 mEq by mouth daily.   PRENATAL ADULT GUMMY/DHA/FA PO Take 1 capsule by mouth 2 (two) times daily.   rosuvastatin 10 MG tablet Commonly known as: CRESTOR Take 1 tablet by mouth 2 (two) times a week. Takes on Monday and Thursday   Xiidra 5 % Soln Generic drug: Lifitegrast Apply 1 drop to eye at bedtime.       Past Surgical History:  She  has a past surgical history that  includes Thyroidectomy (1998); bso (1998); Cholecystectomy; Tonsillectomy; Colonoscopy w/ polypectomy; afib ablation; Abdominal hysterectomy (1983); Breast lumpectomy (Left, 2010); Laparoscopic gastric banding (07/10/2010); Appendectomy; Knee arthroscopy; laparotomy; Total knee arthroplasty (04/13/2012); Cardioversion (10/09/2012); Cardioversion (10/09/2012); Cardioversion (N/A, 11/20/2012); Pacemaker insertion (03/10/2013); pocket revision (N/A, 12/08/2013); Eye surgery; Cystoscopy with retrograde pyelogram, ureteroscopy and stent placement (Right, 02/06/2015); Holmium laser application (N/A, 6/83/4196); Cystoscopy (N/A, 02/06/2015); Video bronchoscopy with endobronchial ultrasound (N/A, 02/07/2017); IR FLUORO GUIDE PORT INSERTION RIGHT (02/24/2017); IR US Guide Vasc Access Right (02/24/2017); Bone marrow biopsy (02/21/2017); Cystoscopy with retrograde pyelogram, ureteroscopy and stent placement (Right, 03/07/2017); Holmium laser application (Right, 11/14/9796); IR PATIENT EVAL TECH 0-60 MINS (03/11/2017); Cardioversion (N/A, 07/18/2017); TEE with cardioversion (09/22/2017); PORTA CATH INSERTION; TEE without cardioversion (N/A, 10/03/2017); Cardioversion (  N/A, 10/03/2017); IR NEPHROSTOMY PLACEMENT RIGHT (11/17/2017); Nephrolithotomy (Right, 11/17/2017); Cystoscopy w/ retrogrades (Left, 11/17/2017); Holmium laser application (Left, 4/94/9447); Cardioversion (N/A, 01/07/2018); IR REMOVAL TUN ACCESS W/ PORT W/O FL MOD SED (04/20/2018); Cardiac catheterization; Reverse shoulder arthroplasty (Right, 05/14/2018); fatty mass removal (1999); Laparoscopic gastric banding; REVERSE SHOULDER REPLACEMENT (Right, 05/14/2018); and RIGHT HEART CATH (N/A, 07/21/2019).  Family History:  Her family history includes Colon cancer in her father; Diabetes in her brother, paternal aunt, paternal grandmother, and sister; Heart attack in her father; Heart disease (age of onset: 14) in her father; Other in her mother.  Social History:  She  reports that she  has never smoked. She has never used smokeless tobacco. She reports that she does not drink alcohol or use drugs.

## 2019-07-27 ENCOUNTER — Encounter (HOSPITAL_COMMUNITY): Payer: Self-pay | Admitting: Cardiology

## 2019-07-27 ENCOUNTER — Other Ambulatory Visit (HOSPITAL_COMMUNITY): Payer: Self-pay

## 2019-07-27 ENCOUNTER — Ambulatory Visit (HOSPITAL_COMMUNITY)
Admission: RE | Admit: 2019-07-27 | Discharge: 2019-07-27 | Disposition: A | Discharge status: Home / Self Care | Payer: Medicare Other | Source: Ambulatory Visit | Attending: Cardiology | Admitting: Cardiology

## 2019-07-27 VITALS — BP 112/65 | HR 75 | Wt 225.6 lb

## 2019-07-27 DIAGNOSIS — Z923 Personal history of irradiation: Secondary | ICD-10-CM | POA: Diagnosis not present

## 2019-07-27 DIAGNOSIS — Z79899 Other long term (current) drug therapy: Secondary | ICD-10-CM | POA: Diagnosis not present

## 2019-07-27 DIAGNOSIS — I480 Paroxysmal atrial fibrillation: Secondary | ICD-10-CM | POA: Insufficient documentation

## 2019-07-27 DIAGNOSIS — I5032 Chronic diastolic (congestive) heart failure: Secondary | ICD-10-CM | POA: Diagnosis not present

## 2019-07-27 DIAGNOSIS — E785 Hyperlipidemia, unspecified: Secondary | ICD-10-CM | POA: Insufficient documentation

## 2019-07-27 DIAGNOSIS — G4733 Obstructive sleep apnea (adult) (pediatric): Secondary | ICD-10-CM | POA: Insufficient documentation

## 2019-07-27 DIAGNOSIS — Z8249 Family history of ischemic heart disease and other diseases of the circulatory system: Secondary | ICD-10-CM | POA: Diagnosis not present

## 2019-07-27 DIAGNOSIS — R0600 Dyspnea, unspecified: Secondary | ICD-10-CM

## 2019-07-27 DIAGNOSIS — Z7901 Long term (current) use of anticoagulants: Secondary | ICD-10-CM | POA: Insufficient documentation

## 2019-07-27 DIAGNOSIS — I340 Nonrheumatic mitral (valve) insufficiency: Secondary | ICD-10-CM | POA: Diagnosis not present

## 2019-07-27 DIAGNOSIS — I272 Pulmonary hypertension, unspecified: Secondary | ICD-10-CM | POA: Insufficient documentation

## 2019-07-27 DIAGNOSIS — Z9884 Bariatric surgery status: Secondary | ICD-10-CM | POA: Insufficient documentation

## 2019-07-27 DIAGNOSIS — Z8572 Personal history of non-Hodgkin lymphomas: Secondary | ICD-10-CM | POA: Insufficient documentation

## 2019-07-27 DIAGNOSIS — I442 Atrioventricular block, complete: Secondary | ICD-10-CM | POA: Diagnosis not present

## 2019-07-27 DIAGNOSIS — I110 Hypertensive heart disease with heart failure: Secondary | ICD-10-CM | POA: Insufficient documentation

## 2019-07-27 DIAGNOSIS — R0609 Other forms of dyspnea: Secondary | ICD-10-CM | POA: Diagnosis not present

## 2019-07-27 DIAGNOSIS — E039 Hypothyroidism, unspecified: Secondary | ICD-10-CM | POA: Insufficient documentation

## 2019-07-27 LAB — BASIC METABOLIC PANEL
Anion gap: 13 (ref 5–15)
BUN: 24 mg/dL — ABNORMAL HIGH (ref 8–23)
CO2: 23 mmol/L (ref 22–32)
Calcium: 9.7 mg/dL (ref 8.9–10.3)
Chloride: 105 mmol/L (ref 98–111)
Creatinine, Ser: 1.22 mg/dL — ABNORMAL HIGH (ref 0.44–1.00)
GFR calc Af Amer: 50 mL/min — ABNORMAL LOW (ref >60)
GFR calc non Af Amer: 43 mL/min — ABNORMAL LOW (ref >60)
Glucose, Bld: 108 mg/dL — ABNORMAL HIGH (ref 70–99)
Potassium: 4.5 mmol/L (ref 3.5–5.1)
Sodium: 141 mmol/L (ref 135–145)

## 2019-07-27 LAB — BRAIN NATRIURETIC PEPTIDE: B Natriuretic Peptide: 129.1 pg/mL — ABNORMAL HIGH (ref 0.0–100.0)

## 2019-07-27 NOTE — H&P (View-Only) (Signed)
PCP: [name], MD Cardiology: Dr. Caryl Comes HF Cardiology: Dr. Aundra Dubin  73 yo with history of non-Hodgkins lymphoma treated with radiation and R-CHOP, obesity with lap banding, paroxysmal atrial fibrillation, complete heart block s/p MDT PPM, and chronic dyspnea presents for followup of exertional shortness of breath.   Patient has a long history of atrial fibrillation.  She had initial ablation in 2009 at [facility], then convergent procedure in 6/14 at [facility]. This was complicated by CHB and MDT PPM was placed. She had breakthrough atrial fibrillation and is now on amiodarone.  She is in NSR today.  She has also had episodes of junctional rhythm in the past.  She has not felt palpitations.   She had Non-Hodgkins lymphoma in 2018.  This was treated with chest radiation and R-CHOP.  SVC syndrome complicated her treatment, now resolved.   She has had a long history of orthostatic intolerance.  It is worst when she takes a shower and raises her arms.  No recent falls and lightheadedness has been less lately.  Lasix has not affected her lightheadedness.    Her main complaint is exertional dyspnea.  She says that this has been present and somewhat stable for more than 2 years.  There was some concern for possible amiodarone lung toxicity.  CT chest was not suggestive of this, actually showed mild radiation changes.  PFTs showed normal spirometry, decreased DLCO.  RHC showed mildly elevated PCWP with prominent v-waves and pulmonary venous hypertension.  Finally, echo showed EF 50-55%, mildly decreased RV systolic function.  The mitral valve was calcified, probably mild stenosis and regurgitation. She gets short of breath walking 30-50 yards, such as to her mailbox.  She is short of breath with stairs. No dyspnea walking around her house.  No orthopnea/PND.  No chest pain. She has daytime fatigue and sleepiness.   ECG (personally reviewed): NSR, QTc 483  Labs (7/20): LFTs normal.  Labs (10/20): hgb 13, K 4.7,  creatinine 0.91  PMH: 1. Non-Hodgkins lymphoma: Diagnosed in 2018.  She was treated with radiation to the chest and R-CHOP.  - She developed SVC syndrome as a complication.  - Now in remission.  2. Nephrolithiasis.  3. Obesity with lap banding.  4. Atrial fibrillation: Paroxysmal.   - Ablation in 2009 at [facility].  - Convergent procedure in 6/14 at [facility].  This was complicated by CHB requiring MDT PPM.  - Now on amiodarone.  5. Complete heart block: MDT PPM.  6. Chronic diastolic CHF:  - Echo (4/40): EF 50-55%, mild diffuse hypokinesis, Moderate RV enlargement with mildly decreased systolic function, mild mitral stenosis and mild mitral regurgitation.  - RHC (10/20): mean RA 6, PA 58/23 mean 40, mean PCWP 23 with prominent v waves to 39, CI 2.5, PVR 3.25 WU.  7. Orthostatic hypotension.  8. H/o thryoid cancer.  9. H/o junctional rhythm.  10. Dyspnea:  - CT chest (7/20): radiation changes in the lungs.  - PFTs (8/20): normal spirometry, DLCO 61%.  11. Hypothyroidism 12. Hyperlipidemia  Social History   Socioeconomic History  . Marital status: Single    Spouse name: Not on file  . Number of children: 0  . Years of education: Not on file  . Highest education level: Master's degree (e.g., MA, MS, MEng, MEd, MSW, MBA)  Occupational History  . 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    Employer: RETIRED  Social Needs  . Financial resource strain: Not on file  . Food insecurity    Worry: Not on  file    Inability: Not on file  . Transportation needs    Medical: Not on file    Non-medical: Not on file  Tobacco Use  . Smoking status: Never Smoker  . Smokeless tobacco: Never Used  Substance and Sexual Activity  . Alcohol use: No    Comment: none since 1990  . Drug use: Never  . Sexual activity: Never    Birth control/protection: Surgical  Lifestyle  . Physical activity    Days per week: Not on file    Minutes per session: Not on file  . Stress: Not on file   Relationships  . Social Herbalist on phone: Not on file    Gets together: Not on file    Attends religious service: Not on file    Active member of club or organization: Not on file    Attends meetings of clubs or organizations: Not on file    Relationship status: Not on file  . Intimate partner violence    Fear of current or ex partner: Not on file    Emotionally abused: Not on file    Physically abused: Not on file    Forced sexual activity: Not on file  Other Topics Concern  . Not on file  Social History Narrative   SINGLE    NO CHILDREN   NEVER SMOKED   EXERCISE 3 X WK   RETIRED   NO CAFFEINE         Family History  Problem Relation Age of Onset  . Heart disease Father 44       MI @autopsy   . Colon cancer Father        COLON  . Heart attack Father   . Other Mother        temporal arteritis   . Diabetes Sister   . Diabetes Brother   . Diabetes Paternal Aunt   . Diabetes Paternal Grandmother     ROS: All systems reviewed and negative except as per HPI.   Current Outpatient Medications  Medication Sig Dispense Refill  . acetaminophen (TYLENOL) 500 MG tablet Take 1,000 mg by mouth every 6 (six) hours as needed for moderate pain or headache.    Marland Kitchen amiodarone (PACERONE) 200 MG tablet Take 1 tablet (200 mg total) by mouth daily. 90 tablet 3  . doxycycline (VIBRA-TABS) 100 MG tablet Take 100 mg by mouth 2 (two) times daily.    Marland Kitchen ELIQUIS 5 MG TABS tablet TAKE 1 TABLET BY MOUTH  TWICE DAILY (Patient taking differently: Take 5 mg by mouth 2 (two) times daily. ) 180 tablet 3  . furosemide (LASIX) 20 MG tablet Take 20 mg by mouth daily.    Marland Kitchen gabapentin (NEURONTIN) 100 MG capsule Take 100-200 mg by mouth 3 (three) times daily as needed (pain).    Marland Kitchen levothyroxine (SYNTHROID) 137 MCG tablet Take 137 mcg by mouth See admin instructions. TAKE 1 TABLET (137 MCG) BY MOUTH DAILY, EXCEPT SATURDAYS    . Lifitegrast (XIIDRA) 5 % SOLN Apply 1 drop to eye at bedtime.     .  potassium chloride (KLOR-CON) 10 MEQ tablet Take 10 mEq by mouth daily.    . Prenatal MV & Min w/FA-DHA (PRENATAL ADULT GUMMY/DHA/FA PO) Take 1 capsule by mouth 2 (two) times daily.    . rosuvastatin (CRESTOR) 10 MG tablet Take 1 tablet by mouth 2 (two) times a week. Takes on Monday and Thursday     Current Facility-Administered Medications  Medication  Dose Route Frequency Provider Last Rate Last Dose  . sodium chloride flush (NS) 0.9 % injection 3 mL  3 mL Intravenous Q12H Deboraha Sprang, MD       BP 112/65   Pulse 75   Wt 102.3 kg (225 lb 9.6 oz)   SpO2 100%   BMI 36.41 kg/m  General: NAD Neck: No JVD, no thyromegaly or thyroid nodule.  Lungs: Clear to auscultation bilaterally with normal respiratory effort. CV: Nondisplaced PMI.  Heart regular S1/S2, no S3/S4, no murmur.  No peripheral edema.  No carotid bruit.  Normal pedal pulses.  Abdomen: Soft, nontender, no hepatosplenomegaly, no distention.  Skin: Intact without lesions or rashes.  Neurologic: Alert and oriented x 3.  Psych: Normal affect. Extremities: No clubbing or cyanosis.  HEENT: Normal.   Assessment/Plan: 1. Exertional dyspnea: NYHA class III.  This has been present for several years now.  Extensive workup recently.  PFTs showed normal spirometry in 8/20 though DLCO was low. CT chest showed mild radiation changes in the lung.  She has seen pulmonary, and amiodarone toxicity does not appear to be a likely diagnosis.  She had an echo in 9/20 showing EF 50-55%, moderate RV enlargement with mildly decreased systolic function. The mitral valve was abnormal with at least mild mitral stenosis and mild mitral regurgitation. Interestingly, her mother had some type of mitral valve disease (she is not sure the exact diagnosis).  [place] in 10/20 showed elevated PCWP, pulmonary venous hypertension, and prominent v-waves on the PCWP tracing.  I started her on Lasix 20 mg daily after RHC.  She says this has not made much difference.  I  suspect that she either has LV diastolic dysfunction or significant mitral valve disease leading to the tall v-waves.  - Continue Lasix 20 mg daily.  - We discussed further evaluation of the mitral valve.  I am concerned about doing a TEE as she has had prior chest radiation.  This is a relative but not absolute contraindication.  We had a long discussion about this. She has significant dyspnea and wants full evaluation of this.  Therefore, I told her that I would arrange for a TEE with anesthesia.  She does not have any dysphagia.  If the probe does not pass easily, I will not aggressively try to pass it.  Additionally, she has a history of lap banding so will try to keep the probe out of her stomach.  She understands the risks and benefits and wants to proceed with the study.  - Check BMET and BNP today.  2. OSA: I do have some suspicion for OSA with daytime sleepiness and fatigue.  Home sleep study has been ordered.  3. Atrial fibrillation: Paroxysmal.  She is in NSR today. She has history of pulmonary vein isolation at [facility] and later convergent procedure at [facility].  She is now maintaining NSR on amiodarone.  As above, evaluation has not suggested amiodarone toxicity.   - Continue amiodarone 200 mg daily.  LFTs normal in 7/20.  She will need regular eye exam. She has hypothyroidism and is on Levoxyl.  - Continue Eliquis 5 mg bid.  4. Complete heart block: S/p convergent procedure.  Has MDT PPM.  5. Pulmonary hypertension: Pulmonary venous hypertension on RHC in 58/09, from diastolic LV dysfunction or mitral valve disease.   Loralie Champagne 07/27/2019

## 2019-07-27 NOTE — Patient Instructions (Addendum)
Labs today We will only contact you if something comes back abnormal or we need to make some changes. Otherwise no news is good news!   COVID Screen: You are scheduled for pre procedure covid screening on Monday November 9th, 2020 at 12pm at [facility].  The address [place] [address] [place] [address].  After this test, you are required to quarantined until the day of your procedure.  You are scheduled for TEE (transesophageal echo) on Wednesday, November 11th, 2020. (see attached sheet)  Your physician recommends that you schedule a follow-up appointment in: 1 month with Dr Aundra Dubin  At the [facility], you and your health needs are our priority. As part of our continuing mission to provide you with exceptional heart care, we have created designated Provider Care Teams. These Care Teams include your primary Cardiologist (physician) and Advanced Practice Providers (APPs- Physician Assistants and Nurse Practitioners) who all work together to provide you with the care you need, when you need it.   You may see any of the following providers on your designated Care Team at your next follow up: Marland Kitchen Dr Glori Bickers . Dr Loralie Champagne . Darrick Grinder, NP . Lyda Jester, PA   Please be sure to bring in all your medications bottles to every appointment.

## 2019-07-27 NOTE — Progress Notes (Signed)
PCP: [name], MD Cardiology: Dr. Caryl Comes HF Cardiology: Dr. Aundra Dubin  73 yo with history of non-Hodgkins lymphoma treated with radiation and R-CHOP, obesity with lap banding, paroxysmal atrial fibrillation, complete heart block s/p MDT PPM, and chronic dyspnea presents for followup of exertional shortness of breath.   Patient has a long history of atrial fibrillation.  She had initial ablation in 2009 at [facility], then convergent procedure in 6/14 at [facility]. This was complicated by CHB and MDT PPM was placed. She had breakthrough atrial fibrillation and is now on amiodarone.  She is in NSR today.  She has also had episodes of junctional rhythm in the past.  She has not felt palpitations.   She had Non-Hodgkins lymphoma in 2018.  This was treated with chest radiation and R-CHOP.  SVC syndrome complicated her treatment, now resolved.   She has had a long history of orthostatic intolerance.  It is worst when she takes a shower and raises her arms.  No recent falls and lightheadedness has been less lately.  Lasix has not affected her lightheadedness.    Her main complaint is exertional dyspnea.  She says that this has been present and somewhat stable for more than 2 years.  There was some concern for possible amiodarone lung toxicity.  CT chest was not suggestive of this, actually showed mild radiation changes.  PFTs showed normal spirometry, decreased DLCO.  RHC showed mildly elevated PCWP with prominent v-waves and pulmonary venous hypertension.  Finally, echo showed EF 50-55%, mildly decreased RV systolic function.  The mitral valve was calcified, probably mild stenosis and regurgitation. She gets short of breath walking 30-50 yards, such as to her mailbox.  She is short of breath with stairs. No dyspnea walking around her house.  No orthopnea/PND.  No chest pain. She has daytime fatigue and sleepiness.   ECG (personally reviewed): NSR, QTc 483  Labs (7/20): LFTs normal.  Labs (10/20): hgb 13, K 4.7,  creatinine 0.91  PMH: 1. Non-Hodgkins lymphoma: Diagnosed in 2018.  She was treated with radiation to the chest and R-CHOP.  - She developed SVC syndrome as a complication.  - Now in remission.  2. Nephrolithiasis.  3. Obesity with lap banding.  4. Atrial fibrillation: Paroxysmal.   - Ablation in 2009 at [facility].  - Convergent procedure in 6/14 at [facility]  This was complicated by CHB requiring MDT PPM.  - Now on amiodarone.  5. Complete heart block: MDT PPM.  6. Chronic diastolic CHF:  - Echo (4/19): EF 50-55%, mild diffuse hypokinesis, Moderate RV enlargement with mildly decreased systolic function, mild mitral stenosis and mild mitral regurgitation.  - RHC (10/20): mean RA 6, PA 58/23 mean 40, mean PCWP 23 with prominent v waves to 39, CI 2.5, PVR 3.25 WU.  7. Orthostatic hypotension.  8. H/o thryoid cancer.  9. H/o junctional rhythm.  10. Dyspnea:  - CT chest (7/20): radiation changes in the lungs.  - PFTs (8/20): normal spirometry, DLCO 61%.  11. Hypothyroidism 12. Hyperlipidemia  Social History   Socioeconomic History  . Marital status: Single    Spouse name: Not on file  . Number of children: 0  . Years of education: Not on file  . Highest education level: Master's degree (e.g., MA, MS, MEng, MEd, MSW, MBA)  Occupational History  . 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    Employer: RETIRED  Social Needs  . Financial resource strain: Not on file  . Food insecurity    Worry: Not on  file    Inability: Not on file  . Transportation needs    Medical: Not on file    Non-medical: Not on file  Tobacco Use  . Smoking status: Never Smoker  . Smokeless tobacco: Never Used  Substance and Sexual Activity  . Alcohol use: No    Comment: none since 1990  . Drug use: Never  . Sexual activity: Never    Birth control/protection: Surgical  Lifestyle  . Physical activity    Days per week: Not on file    Minutes per session: Not on file  . Stress: Not on file   Relationships  . Social Herbalist on phone: Not on file    Gets together: Not on file    Attends religious service: Not on file    Active member of club or organization: Not on file    Attends meetings of clubs or organizations: Not on file    Relationship status: Not on file  . Intimate partner violence    Fear of current or ex partner: Not on file    Emotionally abused: Not on file    Physically abused: Not on file    Forced sexual activity: Not on file  Other Topics Concern  . Not on file  Social History Narrative   SINGLE    NO CHILDREN   NEVER SMOKED   EXERCISE 3 X WK   RETIRED   NO CAFFEINE         Family History  Problem Relation Age of Onset  . Heart disease Father 92       MI @autopsy   . Colon cancer Father        COLON  . Heart attack Father   . Other Mother        temporal arteritis   . Diabetes Sister   . Diabetes Brother   . Diabetes Paternal Aunt   . Diabetes Paternal Grandmother     ROS: All systems reviewed and negative except as per HPI.   Current Outpatient Medications  Medication Sig Dispense Refill  . acetaminophen (TYLENOL) 500 MG tablet Take 1,000 mg by mouth every 6 (six) hours as needed for moderate pain or headache.    Marland Kitchen amiodarone (PACERONE) 200 MG tablet Take 1 tablet (200 mg total) by mouth daily. 90 tablet 3  . doxycycline (VIBRA-TABS) 100 MG tablet Take 100 mg by mouth 2 (two) times daily.    Marland Kitchen ELIQUIS 5 MG TABS tablet TAKE 1 TABLET BY MOUTH  TWICE DAILY (Patient taking differently: Take 5 mg by mouth 2 (two) times daily. ) 180 tablet 3  . furosemide (LASIX) 20 MG tablet Take 20 mg by mouth daily.    Marland Kitchen gabapentin (NEURONTIN) 100 MG capsule Take 100-200 mg by mouth 3 (three) times daily as needed (pain).    Marland Kitchen levothyroxine (SYNTHROID) 137 MCG tablet Take 137 mcg by mouth See admin instructions. TAKE 1 TABLET (137 MCG) BY MOUTH DAILY, EXCEPT SATURDAYS    . Lifitegrast (XIIDRA) 5 % SOLN Apply 1 drop to eye at bedtime.     .  potassium chloride (KLOR-CON) 10 MEQ tablet Take 10 mEq by mouth daily.    . Prenatal MV & Min w/FA-DHA (PRENATAL ADULT GUMMY/DHA/FA PO) Take 1 capsule by mouth 2 (two) times daily.    . rosuvastatin (CRESTOR) 10 MG tablet Take 1 tablet by mouth 2 (two) times a week. Takes on Monday and Thursday     Current Facility-Administered Medications  Medication  Dose Route Frequency Provider Last Rate Last Dose  . sodium chloride flush (NS) 0.9 % injection 3 mL  3 mL Intravenous Q12H Deboraha Sprang, MD       BP 112/65   Pulse 75   Wt 102.3 kg (225 lb 9.6 oz)   SpO2 100%   BMI 36.41 kg/m  General: NAD Neck: No JVD, no thyromegaly or thyroid nodule.  Lungs: Clear to auscultation bilaterally with normal respiratory effort. CV: Nondisplaced PMI.  Heart regular S1/S2, no S3/S4, no murmur.  No peripheral edema.  No carotid bruit.  Normal pedal pulses.  Abdomen: Soft, nontender, no hepatosplenomegaly, no distention.  Skin: Intact without lesions or rashes.  Neurologic: Alert and oriented x 3.  Psych: Normal affect. Extremities: No clubbing or cyanosis.  HEENT: Normal.   Assessment/Plan: 1. Exertional dyspnea: NYHA class III.  This has been present for several years now.  Extensive workup recently.  PFTs showed normal spirometry in 8/20 though DLCO was low. CT chest showed mild radiation changes in the lung.  She has seen pulmonary, and amiodarone toxicity does not appear to be a likely diagnosis.  She had an echo in 9/20 showing EF 50-55%, moderate RV enlargement with mildly decreased systolic function. The mitral valve was abnormal with at least mild mitral stenosis and mild mitral regurgitation. Interestingly, her mother had some type of mitral valve disease (she is not sure the exact diagnosis).  [place] in 10/20 showed elevated PCWP, pulmonary venous hypertension, and prominent v-waves on the PCWP tracing.  I started her on Lasix 20 mg daily after RHC.  She says this has not made much difference.  I  suspect that she either has LV diastolic dysfunction or significant mitral valve disease leading to the tall v-waves.  - Continue Lasix 20 mg daily.  - We discussed further evaluation of the mitral valve.  I am concerned about doing a TEE as she has had prior chest radiation.  This is a relative but not absolute contraindication.  We had a long discussion about this. She has significant dyspnea and wants full evaluation of this.  Therefore, I told her that I would arrange for a TEE with anesthesia.  She does not have any dysphagia.  If the probe does not pass easily, I will not aggressively try to pass it.  Additionally, she has a history of lap banding so will try to keep the probe out of her stomach.  She understands the risks and benefits and wants to proceed with the study.  - Check BMET and BNP today.  2. OSA: I do have some suspicion for OSA with daytime sleepiness and fatigue.  Home sleep study has been ordered.  3. Atrial fibrillation: Paroxysmal.  She is in NSR today. She has history of pulmonary vein isolation at [facility] and later convergent procedure at [facility].  She is now maintaining NSR on amiodarone.  As above, evaluation has not suggested amiodarone toxicity.   - Continue amiodarone 200 mg daily.  LFTs normal in 7/20.  She will need regular eye exam. She has hypothyroidism and is on Levoxyl.  - Continue Eliquis 5 mg bid.  4. Complete heart block: S/p convergent procedure.  Has MDT PPM.  5. Pulmonary hypertension: Pulmonary venous hypertension on RHC in 83/38, from diastolic LV dysfunction or mitral valve disease.   Loralie Champagne 07/27/2019

## 2019-07-28 DIAGNOSIS — M6281 Muscle weakness (generalized): Secondary | ICD-10-CM | POA: Diagnosis not present

## 2019-07-28 DIAGNOSIS — S46911D Strain of unspecified muscle, fascia and tendon at shoulder and upper arm level, right arm, subsequent encounter: Secondary | ICD-10-CM | POA: Diagnosis not present

## 2019-07-28 DIAGNOSIS — M25611 Stiffness of right shoulder, not elsewhere classified: Secondary | ICD-10-CM | POA: Diagnosis not present

## 2019-08-02 ENCOUNTER — Other Ambulatory Visit (HOSPITAL_COMMUNITY)
Admission: RE | Admit: 2019-08-02 | Discharge: 2019-08-02 | Disposition: A | Discharge status: Home / Self Care | Payer: Medicare Other | Source: Ambulatory Visit | Attending: Cardiology | Admitting: Cardiology

## 2019-08-02 DIAGNOSIS — Z20828 Contact with and (suspected) exposure to other viral communicable diseases: Secondary | ICD-10-CM | POA: Diagnosis not present

## 2019-08-02 DIAGNOSIS — Z01812 Encounter for preprocedural laboratory examination: Secondary | ICD-10-CM | POA: Insufficient documentation

## 2019-08-02 LAB — SARS CORONAVIRUS 2 (TAT 6-24 HRS): SARS Coronavirus 2: NEGATIVE

## 2019-08-04 ENCOUNTER — Ambulatory Visit (HOSPITAL_BASED_OUTPATIENT_CLINIC_OR_DEPARTMENT_OTHER)
Admission: RE | Admit: 2019-08-04 | Discharge: 2019-08-04 | Disposition: A | Discharge status: Home / Self Care | Payer: Medicare Other | Source: Ambulatory Visit | Attending: Cardiology | Admitting: Cardiology

## 2019-08-04 ENCOUNTER — Ambulatory Visit (HOSPITAL_COMMUNITY): Payer: Medicare Other | Admitting: Certified Registered Nurse Anesthetist

## 2019-08-04 ENCOUNTER — Other Ambulatory Visit: Payer: Self-pay

## 2019-08-04 ENCOUNTER — Ambulatory Visit (HOSPITAL_COMMUNITY)
Admission: RE | Admit: 2019-08-04 | Discharge: 2019-08-04 | Disposition: A | Discharge status: Home / Self Care | Payer: Medicare Other | Attending: Cardiology | Admitting: Cardiology

## 2019-08-04 ENCOUNTER — Encounter (HOSPITAL_COMMUNITY): Payer: Self-pay | Admitting: Certified Registered Nurse Anesthetist

## 2019-08-04 ENCOUNTER — Encounter (HOSPITAL_COMMUNITY)
Admission: RE | Disposition: A | Discharge status: Home / Self Care | Payer: Self-pay | Source: Home / Self Care | Attending: Cardiology

## 2019-08-04 DIAGNOSIS — I272 Pulmonary hypertension, unspecified: Secondary | ICD-10-CM | POA: Insufficient documentation

## 2019-08-04 DIAGNOSIS — I5032 Chronic diastolic (congestive) heart failure: Secondary | ICD-10-CM | POA: Diagnosis not present

## 2019-08-04 DIAGNOSIS — R0609 Other forms of dyspnea: Secondary | ICD-10-CM | POA: Insufficient documentation

## 2019-08-04 DIAGNOSIS — E785 Hyperlipidemia, unspecified: Secondary | ICD-10-CM | POA: Insufficient documentation

## 2019-08-04 DIAGNOSIS — Z8572 Personal history of non-Hodgkin lymphomas: Secondary | ICD-10-CM | POA: Insufficient documentation

## 2019-08-04 DIAGNOSIS — Z79899 Other long term (current) drug therapy: Secondary | ICD-10-CM | POA: Diagnosis not present

## 2019-08-04 DIAGNOSIS — I442 Atrioventricular block, complete: Secondary | ICD-10-CM | POA: Insufficient documentation

## 2019-08-04 DIAGNOSIS — E669 Obesity, unspecified: Secondary | ICD-10-CM | POA: Diagnosis not present

## 2019-08-04 DIAGNOSIS — Z6836 Body mass index (BMI) 36.0-36.9, adult: Secondary | ICD-10-CM | POA: Insufficient documentation

## 2019-08-04 DIAGNOSIS — I340 Nonrheumatic mitral (valve) insufficiency: Secondary | ICD-10-CM | POA: Diagnosis not present

## 2019-08-04 DIAGNOSIS — Z79890 Hormone replacement therapy (postmenopausal): Secondary | ICD-10-CM | POA: Diagnosis not present

## 2019-08-04 DIAGNOSIS — Z7901 Long term (current) use of anticoagulants: Secondary | ICD-10-CM | POA: Insufficient documentation

## 2019-08-04 DIAGNOSIS — E039 Hypothyroidism, unspecified: Secondary | ICD-10-CM | POA: Insufficient documentation

## 2019-08-04 DIAGNOSIS — I050 Rheumatic mitral stenosis: Secondary | ICD-10-CM | POA: Diagnosis not present

## 2019-08-04 DIAGNOSIS — Z923 Personal history of irradiation: Secondary | ICD-10-CM | POA: Diagnosis not present

## 2019-08-04 DIAGNOSIS — Z9884 Bariatric surgery status: Secondary | ICD-10-CM | POA: Insufficient documentation

## 2019-08-04 DIAGNOSIS — I480 Paroxysmal atrial fibrillation: Secondary | ICD-10-CM | POA: Insufficient documentation

## 2019-08-04 DIAGNOSIS — I4819 Other persistent atrial fibrillation: Secondary | ICD-10-CM | POA: Diagnosis not present

## 2019-08-04 DIAGNOSIS — I059 Rheumatic mitral valve disease, unspecified: Secondary | ICD-10-CM | POA: Insufficient documentation

## 2019-08-04 DIAGNOSIS — I4892 Unspecified atrial flutter: Secondary | ICD-10-CM | POA: Diagnosis not present

## 2019-08-04 DIAGNOSIS — I110 Hypertensive heart disease with heart failure: Secondary | ICD-10-CM | POA: Insufficient documentation

## 2019-08-04 HISTORY — PX: TEE WITHOUT CARDIOVERSION: SHX5443

## 2019-08-04 SURGERY — ECHOCARDIOGRAM, TRANSESOPHAGEAL
Anesthesia: Monitor Anesthesia Care

## 2019-08-04 MED ORDER — ONDANSETRON HCL 4 MG/2ML IJ SOLN
INTRAMUSCULAR | Status: DC | PRN
  Administered 2019-08-04: 4 mg via INTRAVENOUS

## 2019-08-04 MED ORDER — PROPOFOL 10 MG/ML IV BOLUS
INTRAVENOUS | Status: DC | PRN
  Administered 2019-08-04: 50 mg via INTRAVENOUS

## 2019-08-04 MED ORDER — LIDOCAINE 2% (20 MG/ML) 5 ML SYRINGE
INTRAMUSCULAR | Status: DC | PRN
  Administered 2019-08-04: 60 mg via INTRAVENOUS

## 2019-08-04 MED ORDER — PROPOFOL 500 MG/50ML IV EMUL
INTRAVENOUS | Status: DC | PRN
  Administered 2019-08-04: 50 ug/kg/min via INTRAVENOUS

## 2019-08-04 MED ORDER — SODIUM CHLORIDE 0.9 % IV SOLN: INTRAVENOUS | Status: DC

## 2019-08-04 MED ORDER — LACTATED RINGERS IV SOLN
INTRAVENOUS | Status: DC | PRN
  Administered 2019-08-04: 13:00:00 via INTRAVENOUS

## 2019-08-04 NOTE — Anesthesia Preprocedure Evaluation (Addendum)
Anesthesia Evaluation  Patient identified by MRN, date of birth, ID band Patient awake    Reviewed: Allergy & Precautions, NPO status , Patient's Chart, lab work & pertinent test results  History of Anesthesia Complications Negative for: history of anesthetic complications  Airway Mallampati: II  TM Distance: >3 FB Neck ROM: Full  Mouth opening: Limited Mouth Opening  Dental no notable dental hx.    Pulmonary sleep apnea ,    Pulmonary exam normal        Cardiovascular hypertension, Pt. on medications Normal cardiovascular exam+ dysrhythmias Atrial Fibrillation + pacemaker   TTE 2019: EF 50 to 55%, LV diffuse mild hypokinesis without focal wall motion abnormalities, RV with mildly reduced systolic function, moderate LAE, mild moderate TR, mild PVR, moderately elevated pulmonary artery systolic pressure, pacer wire is visualized in the RA and RV   Neuro/Psych CVA (2003) negative psych ROS   GI/Hepatic hiatal hernia,   Endo/Other  Hypothyroidism   Renal/GU negative Renal ROS  negative genitourinary   Musculoskeletal  (+) Arthritis ,   Abdominal   Peds  Hematology negative hematology ROS (+)   Anesthesia Other Findings Day of surgery medications reviewed with patient.  Reproductive/Obstetrics negative OB ROS                           Anesthesia Physical Anesthesia Plan  ASA: III  Anesthesia Plan: MAC   Post-op Pain Management:    Induction:   PONV Risk Score and Plan: 2 and Treatment may vary due to age or medical condition and Propofol infusion  Airway Management Planned: Natural Airway and Nasal Cannula  Additional Equipment: None  Intra-op Plan:   Post-operative Plan:   Informed Consent: I have reviewed the patients History and Physical, chart, labs and discussed the procedure including the risks, benefits and alternatives for the proposed anesthesia with the patient or  authorized representative who has indicated his/her understanding and acceptance.       Plan Discussed with: CRNA  Anesthesia Plan Comments:         Anesthesia Quick Evaluation

## 2019-08-04 NOTE — Progress Notes (Signed)
  Echocardiogram Echocardiogram Transesophageal has been performed.  Kayla Harrison M 08/04/2019, 1:18 PM

## 2019-08-04 NOTE — Anesthesia Procedure Notes (Signed)
Procedure Name: MAC Date/Time: 08/04/2019 12:43 PM Performed by: Lowella Dell, CRNA Pre-anesthesia Checklist: Patient identified, Emergency Drugs available, Suction available, Patient being monitored and Timeout performed Patient Re-evaluated:Patient Re-evaluated prior to induction Oxygen Delivery Method: Nasal cannula Induction Type: IV induction Placement Confirmation: positive ETCO2 Dental Injury: Teeth and Oropharynx as per pre-operative assessment

## 2019-08-04 NOTE — Interval H&P Note (Signed)
History and Physical Interval Note:  08/04/2019 12:48 PM  Kayla Harrison  has presented today for surgery, with the diagnosis of MITRAL VALVE DISEASE.  The various methods of treatment have been discussed with the patient and family. After consideration of risks, benefits and other options for treatment, the patient has consented to  Procedure(s): TRANSESOPHAGEAL ECHOCARDIOGRAM (TEE) (N/A) as a surgical intervention.  The patient's history has been reviewed, patient examined, no change in status, stable for surgery.  I have reviewed the patient's chart and labs.  Questions were answered to the patient's satisfaction.     Leonela Kivi [employer]

## 2019-08-04 NOTE — Transfer of Care (Signed)
Immediate Anesthesia Transfer of Care Note  Patient: Kayla Harrison  Procedure(s) Performed: TRANSESOPHAGEAL ECHOCARDIOGRAM (TEE) (N/A )  Patient Location: PACU and Endoscopy Unit  Anesthesia Type:MAC  Level of Consciousness: awake and patient cooperative  Airway & Oxygen Therapy: Patient Spontanous Breathing and Patient connected to nasal cannula oxygen  Post-op Assessment: Report given to RN, Post -op Vital signs reviewed and stable and Patient moving all extremities  Post vital signs: Reviewed and stable  Last Vitals:  Vitals Value Taken Time  BP 107/55 08/04/19 1308  Temp 36.6 C 08/04/19 1308  Pulse 70 08/04/19 1308  Resp 21 08/04/19 1308  SpO2 100 % 08/04/19 1308    Last Pain:  Vitals:   08/04/19 1308  TempSrc: Temporal  PainSc: 0-No pain         Complications: No apparent anesthesia complications

## 2019-08-04 NOTE — Discharge Instructions (Signed)
TEE ° °YOU HAD AN CARDIAC PROCEDURE TODAY: Refer to the procedure report and other information in the discharge instructions given to you for any specific questions about what was found during the examination. If this information does not answer your questions, please call [facility] office at [phone] to clarify.  ° °DIET: Your first meal following the procedure should be a light meal and then it is ok to progress to your normal diet. A half-sandwich or bowl of soup is an example of a good first meal. Heavy or fried foods are harder to digest and may make you feel nauseous or bloated. Drink plenty of fluids but you should avoid alcoholic beverages for 24 hours. If you had a esophageal dilation, please see attached instructions for diet.  ° °ACTIVITY: Your care partner should take you home directly after the procedure. You should plan to take it easy, moving slowly for the rest of the day. You can resume normal activity the day after the procedure however YOU SHOULD NOT DRIVE, use power tools, machinery or perform tasks that involve climbing or major physical exertion for 24 hours (because of the sedation medicines used during the test).  ° °SYMPTOMS TO REPORT IMMEDIATELY: °A cardiologist can be reached at any hour. Please call [phone] for any of the following symptoms:  °Vomiting of blood or coffee ground material  °New, significant abdominal pain  °New, significant chest pain or pain under the shoulder blades  °Painful or persistently difficult swallowing  °New shortness of breath  °Black, tarry-looking or red, bloody stools ° °FOLLOW UP:  °Please also call with any specific questions about appointments or follow up tests. ° ° °

## 2019-08-04 NOTE — Anesthesia Postprocedure Evaluation (Signed)
Anesthesia Post Note  Patient: Kayla Harrison  Procedure(s) Performed: TRANSESOPHAGEAL ECHOCARDIOGRAM (TEE) (N/A )     Patient location during evaluation: PACU Anesthesia Type: MAC Level of consciousness: awake and alert and oriented Pain management: pain level controlled Vital Signs Assessment: post-procedure vital signs reviewed and stable Respiratory status: spontaneous breathing, nonlabored ventilation and respiratory function stable Cardiovascular status: blood pressure returned to baseline Postop Assessment: no apparent nausea or vomiting Anesthetic complications: no    Last Vitals:  Vitals:   08/04/19 1308 08/04/19 1310  BP: (!) 107/55 (!) 111/49  Pulse: 70 70  Resp: (!) 21 16  Temp: 36.6 C   SpO2: 100% 96%    Last Pain:  Vitals:   08/04/19 1310  TempSrc:   PainSc: 0-No pain                 Brennan Bailey

## 2019-08-04 NOTE — CV Procedure (Signed)
Procedure: TEE  Indication: Mitral valve disease.   Sedation: Per anesthesiology.   Findings: Please see echo section for full report.  Normal LV size and systolic function, EF 35%.  No regional wall motion abnormalities.  Normal RV size and systolic function.  Pacemaker in RV.  Moderate left atrial enlargement, no LA appendage thrombus (small appendage).  Moderate right atrial enlargement.  No ASD or PFO noted by color doppler.  Moderate TR, peak RV-RA gradient 33 mmHg.  Moderately calcified mitral valve with moderate MAC, there was moderate mitral regurgitation (no pulmonary vein systolic flow reversal) and mild mitral stenosis (mean gradient 4 mmHg with MVA > 2 by PHT).  [place], mildly calcified aortic valve with no stenosis, trivial regurgitation.  Normal caliber thoracic aorta with mild plaque.   Transgastric views not obtained due to history of lap banding.   Loralie Champagne 08/04/2019 1:06 PM

## 2019-08-05 ENCOUNTER — Other Ambulatory Visit (HOSPITAL_COMMUNITY): Payer: Self-pay

## 2019-08-05 ENCOUNTER — Encounter (HOSPITAL_COMMUNITY): Payer: Self-pay | Admitting: *Deleted

## 2019-08-05 DIAGNOSIS — I5032 Chronic diastolic (congestive) heart failure: Secondary | ICD-10-CM

## 2019-08-05 NOTE — Progress Notes (Signed)
Received referral from Dr. Aundra Dubin for this pt to participate in pulmonary rehab with the diagnosis of diastolic heart failure. Clinical review of pt follow up appt on 11/3 with Dr. Aundra Dubin Heart failure office note. Pt with TEE on 11/12 and right heart cath on 10/28   Pt appropriate for scheduling for Pulmonary rehab. Covid risk score is 4. Will forward to support staff for scheduling and verification of insurance eligibility/benefits with pt  consent. Cherre Huger, BSN Cardiac and Training and development officer

## 2019-08-05 NOTE — Progress Notes (Signed)
Ordered for pulmonary rehab per MD request

## 2019-08-06 ENCOUNTER — Telehealth (HOSPITAL_COMMUNITY): Payer: Self-pay

## 2019-08-06 ENCOUNTER — Encounter (HOSPITAL_COMMUNITY): Payer: Self-pay | Admitting: Cardiology

## 2019-08-13 ENCOUNTER — Other Ambulatory Visit (HOSPITAL_COMMUNITY): Payer: Medicare Other

## 2019-08-13 ENCOUNTER — Encounter (HOSPITAL_COMMUNITY): Payer: Self-pay

## 2019-08-13 ENCOUNTER — Ambulatory Visit (HOSPITAL_COMMUNITY)
Admission: RE | Admit: 2019-08-13 | Discharge: 2019-08-13 | Disposition: A | Discharge status: Home / Self Care | Payer: Medicare Other | Source: Ambulatory Visit | Attending: Hematology and Oncology | Admitting: Hematology and Oncology

## 2019-08-13 ENCOUNTER — Other Ambulatory Visit: Payer: Self-pay

## 2019-08-13 DIAGNOSIS — C8522 Mediastinal (thymic) large B-cell lymphoma, intrathoracic lymph nodes: Secondary | ICD-10-CM | POA: Diagnosis not present

## 2019-08-13 DIAGNOSIS — R918 Other nonspecific abnormal finding of lung field: Secondary | ICD-10-CM | POA: Diagnosis not present

## 2019-08-13 MED ORDER — IOHEXOL 300 MG/ML  SOLN
100.0000 mL | Freq: Once | INTRAMUSCULAR | Status: AC | PRN
  Administered 2019-08-13: 60 mL via INTRAVENOUS

## 2019-08-13 MED ORDER — SODIUM CHLORIDE (PF) 0.9 % IJ SOLN
INTRAMUSCULAR | Status: AC
  Filled 2019-08-13: qty 50

## 2019-08-16 ENCOUNTER — Telehealth: Payer: Self-pay | Admitting: Hematology and Oncology

## 2019-08-16 NOTE — Telephone Encounter (Signed)
Scheduled appt per 11/23 sch message - pt is aware of appt date and time   

## 2019-08-16 NOTE — Telephone Encounter (Signed)
Scheduled appt per 11/23 sch message - pt aware of appt date and time

## 2019-08-23 DIAGNOSIS — Z85828 Personal history of other malignant neoplasm of skin: Secondary | ICD-10-CM | POA: Diagnosis not present

## 2019-08-23 DIAGNOSIS — L905 Scar conditions and fibrosis of skin: Secondary | ICD-10-CM | POA: Diagnosis not present

## 2019-08-23 DIAGNOSIS — L819 Disorder of pigmentation, unspecified: Secondary | ICD-10-CM | POA: Diagnosis not present

## 2019-08-24 ENCOUNTER — Other Ambulatory Visit: Payer: Self-pay

## 2019-08-24 ENCOUNTER — Encounter (HOSPITAL_COMMUNITY)
Admission: RE | Admit: 2019-08-24 | Discharge: 2019-08-24 | Disposition: A | Discharge status: Home / Self Care | Payer: Medicare Other | Source: Ambulatory Visit | Attending: Cardiology | Admitting: Cardiology

## 2019-08-24 VITALS — BP 124/70 | HR 75 | Ht 66.0 in | Wt 228.2 lb

## 2019-08-24 DIAGNOSIS — I5032 Chronic diastolic (congestive) heart failure: Secondary | ICD-10-CM | POA: Insufficient documentation

## 2019-08-24 NOTE — Progress Notes (Signed)
Patient Care Team: [name], MD as PCP - General (Endocrinology) Deboraha Sprang, MD as PCP - Cardiology (Cardiology)  DIAGNOSIS:    ICD-10-CM   1. 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)  C85.22     SUMMARY OF ONCOLOGIC HISTORY: Oncology History  Non-Hodgkin lymphoma, unspecified, intrathoracic lymph nodes ([place])  02/03/2017 Imaging   Large superior mediastinal mass contiguous with right hilar region encasing the trachea and right mainstem bronchus measuring 5.4 x 7.5 x 6 cm causing SVC obstruction, additional mediastinal lymph nodes subcarinal 1.6 cm and left hilar 1 cm; multiple small pulmonary nodules 4-6 mm    02/11/2017 Initial Diagnosis   Flow cytometry of mediastinal mass revealed monoclonal population of B cells with expression of CD10 consistent with non-Hodgkin B-cell lymphoma germinal center operation; bone marrow biopsy negative for lymphoma   04/02/2017 - 07/16/2017 Chemotherapy   R-CHOP X 6 cycles      CHIEF COMPLIANT: Follow-up of lymphoma to review CT scan  INTERVAL HISTORY: Kayla Harrison is a 73 y.o. with above-mentioned history of lymphoma treated with 6 cycles of R-CHOP. Chest CT on 08/13/19 showed opacities and nodularity in the right lower lobe improved, diminished right-sided pleural effusion, and stable 6 mm nodule in the left lung base. She presents to the clinic today for follow-up to review her recent scan.   REVIEW OF SYSTEMS:   Constitutional: Denies fevers, chills or abnormal weight loss Eyes: Denies blurriness of vision Ears, nose, mouth, throat, and face: Denies mucositis or sore throat Respiratory: Denies cough, dyspnea or wheezes Cardiovascular: Denies palpitation, chest discomfort Gastrointestinal: Denies nausea, heartburn or change in bowel habits Skin: Denies abnormal skin rashes Lymphatics: Denies new lymphadenopathy or easy bruising Neurological: Denies numbness, tingling or new weaknesses  Behavioral/Psych: Mood is stable, no new changes  Extremities: No lower extremity edema Breast: denies any pain or lumps or nodules in either breasts All other systems were reviewed with the patient and are negative.  I have reviewed the past medical history, past surgical history, social history and family history with the patient and they are unchanged from previous note.  ALLERGIES:  is allergic to dofetilide; rivaroxaban; epinephrine; adhesive [tape]; and ketamine.  MEDICATIONS:  Current Outpatient Medications  Medication Sig Dispense Refill  . acetaminophen (TYLENOL) 500 MG tablet Take 1,000 mg by mouth every 6 (six) hours as needed for moderate pain or headache.    Marland Kitchen amiodarone (PACERONE) 200 MG tablet Take 1 tablet (200 mg total) by mouth daily. 90 tablet 3  . doxycycline (VIBRA-TABS) 100 MG tablet Take 100 mg by mouth 2 (two) times daily.    Marland Kitchen ELIQUIS 5 MG TABS tablet TAKE 1 TABLET BY MOUTH  TWICE DAILY (Patient taking differently: Take 5 mg by mouth 2 (two) times daily. ) 180 tablet 3  . furosemide (LASIX) 20 MG tablet Take 20 mg by mouth daily.    Marland Kitchen gabapentin (NEURONTIN) 100 MG capsule Take 100-200 mg by mouth 3 (three) times daily as needed (pain).    Marland Kitchen levothyroxine (SYNTHROID) 137 MCG tablet Take 137 mcg by mouth See admin instructions. TAKE 1 TABLET (137 MCG) BY MOUTH DAILY, EXCEPT SATURDAYS    . Lifitegrast (XIIDRA) 5 % SOLN Apply 1 drop to eye at bedtime.     . potassium chloride (KLOR-CON) 10 MEQ tablet Take 10 mEq by mouth daily.    . Prenatal MV & Min w/FA-DHA (PRENATAL ADULT GUMMY/DHA/FA PO) Take 1 capsule by mouth 2 (two) times daily.    Marland Kitchen  rosuvastatin (CRESTOR) 10 MG tablet Take 1 tablet by mouth 2 (two) times a week. Takes on Monday and Thursday     Current Facility-Administered Medications  Medication Dose Route Frequency Provider Last Rate Last Dose  . sodium chloride flush (NS) 0.9 % injection 3 mL  3 mL Intravenous Q12H Deboraha Sprang, MD        PHYSICAL  EXAMINATION: ECOG PERFORMANCE STATUS: 1 - Symptomatic but completely ambulatory  Vitals:   08/25/19 1529  BP: (!) 144/70  Pulse: 75  Resp: 18  Temp: 98.3 F (36.8 C)  SpO2: 100%   Filed Weights   08/25/19 1529  Weight: 228 lb 6.4 oz (103.6 kg)    GENERAL: alert, no distress and comfortable SKIN: skin color, texture, turgor are normal, no rashes or significant lesions EYES: normal, Conjunctiva are pink and non-injected, sclera clear OROPHARYNX: no exudate, no erythema and lips, buccal mucosa, and tongue normal  NECK: supple, thyroid normal size, non-tender, without nodularity LYMPH: no palpable lymphadenopathy in the cervical, axillary or inguinal LUNGS: clear to auscultation and percussion with normal breathing effort HEART: regular rate & rhythm and no murmurs and no lower extremity edema ABDOMEN: abdomen soft, non-tender and normal bowel sounds MUSCULOSKELETAL: no cyanosis of digits and no clubbing  NEURO: alert & oriented x 3 with fluent speech, no focal motor/sensory deficits EXTREMITIES: No lower extremity edema  LABORATORY DATA:  I have reviewed the data as listed CMP Latest Ref Rng & Units 07/27/2019 07/21/2019 07/06/2019  Glucose 70 - 99 mg/dL 108(H) - 93  BUN 8 - 23 mg/dL 24(H) - 21  Creatinine 0.44 - 1.00 mg/dL 1.22(H) - 0.91  Sodium 135 - 145 mmol/L 141 141 141  Potassium 3.5 - 5.1 mmol/L 4.5 4.0 4.7  Chloride 98 - 111 mmol/L 105 - 105  CO2 22 - 32 mmol/L 23 - 23  Calcium 8.9 - 10.3 mg/dL 9.7 - 9.5  Total Protein 6.0 - 8.5 g/dL - - -  Total Bilirubin 0.0 - 1.2 mg/dL - - -  Alkaline Phos 39 - 117 IU/L - - -  AST 0 - 40 IU/L - - -  ALT 0 - 32 IU/L - - -    Lab Results  Component Value Date   WBC 6.0 07/06/2019   HGB 11.9 (L) 07/21/2019   HCT 35.0 (L) 07/21/2019   MCV 92 07/06/2019   PLT 192 07/06/2019   NEUTROABS 4.4 07/06/2019    ASSESSMENT & PLAN:  Non-Hodgkin lymphoma, unspecified, intrathoracic lymph nodes (HCC) 02/03/2017: Large superior  mediastinal mass contiguous with right hilar region encasing the trachea and right mainstem bronchus measuring 5.4 x 7.5 x 6 cm causing SVC obstruction, additional mediastinal lymph nodes subcarinal 1.6 cm and left hilar 1 cm; multiple small pulmonary nodules 4-6 mm   Bronchoscopy and biopsy 02/09/2017: Monoclonal B-cell population expressing CD10 consistent with non-Hodgkin's B-cell lymphoma germinal center origin Bone marrow biopsy 02/21/2017: No evidence of lymphoma PET/CT scan 44/31/5400: Hypermetabolic right paratracheal nodal mass  R CHOP x6 cycles completed 07/16/2017 ---------------------------------------------------------------------- Atrial flutter: Cardioversion by Dr. Acie Fredrickson 07/18/2017 PET/CT scan 08/27/2017:Hypermetabolic right paratracheal nodal mass February 2019: Renal stone status post nephrostomy  CT chest abdomen pelvis 02/17/2018: New patchy consolidation right lower lobe infection/inflammation, postradiation changes in the mediastinum. No evidence of lymphoma recurrence  Lung infiltrates:  CT CAP 08/13/2019: Stable posttreatment changes in the right lung and mediastinum.  Tree-in-bud opacities and branching nodularity right lower lobe similar to prior study.  Stable 6 mm left lung nodule.  Mild esophageal thickening Instructed her to follow-up with pulmonary regarding the lung findings.  There is no evidence of recurrence of lymphoma. Return to clinic in 1 year with labs scans and follow-up.    No orders of the defined types were placed in this encounter.  The patient has a good understanding of the overall plan. she agrees with it. she will call with any problems that may develop before the next visit here.  Nicholas Lose, MD 08/25/2019  Julious Oka Dorshimer, am acting as scribe for Dr. Nicholas Lose.  I have reviewed the above documentation for accuracy and completeness, and I agree with the above.
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
## 2019-08-24 NOTE — Progress Notes (Signed)
Pulmonary Individual Treatment Plan  Patient Details  Name: Kayla Harrison MRN: [id number] Date of Birth: 11/15/1945 Referring Provider:     Pulmonary Rehab Walk Test from 08/24/2019 in [facility]  Referring Provider  Dr. Aundra Dubin      Initial Encounter Date:    Pulmonary Rehab Walk Test from 08/24/2019 in [facility]  Date  08/24/19      Visit Diagnosis: Heart failure, diastolic, chronic ([place])  Patient's Home Medications on Admission:   Current Outpatient Medications:  .  acetaminophen (TYLENOL) 500 MG tablet, Take 1,000 mg by mouth every 6 (six) hours as needed for moderate pain or headache., Disp: , Rfl:  .  amiodarone (PACERONE) 200 MG tablet, Take 1 tablet (200 mg total) by mouth daily., Disp: 90 tablet, Rfl: 3 .  ELIQUIS 5 MG TABS tablet, TAKE 1 TABLET BY MOUTH  TWICE DAILY (Patient taking differently: Take 5 mg by mouth 2 (two) times daily. ), Disp: 180 tablet, Rfl: 3 .  levothyroxine (SYNTHROID) 137 MCG tablet, Take 137 mcg by mouth See admin instructions. TAKE 1 TABLET (137 MCG) BY MOUTH DAILY, EXCEPT SATURDAYS, Disp: , Rfl:  .  Lifitegrast (XIIDRA) 5 % SOLN, Apply 1 drop to eye at bedtime. , Disp: , Rfl:  .  Prenatal MV & Min w/FA-DHA (PRENATAL ADULT GUMMY/DHA/FA PO), Take 1 capsule by mouth 2 (two) times daily., Disp: , Rfl:  .  rosuvastatin (CRESTOR) 10 MG tablet, Take 1 tablet by mouth 2 (two) times a week. Takes on Monday and Thursday, Disp: , Rfl:  .  doxycycline (VIBRA-TABS) 100 MG tablet, Take 100 mg by mouth 2 (two) times daily., Disp: , Rfl:  .  furosemide (LASIX) 20 MG tablet, Take 20 mg by mouth daily., Disp: , Rfl:  .  gabapentin (NEURONTIN) 100 MG capsule, Take 100-200 mg by mouth 3 (three) times daily as needed (pain)., Disp: , Rfl:  .  potassium chloride (KLOR-CON) 10 MEQ tablet, Take 10 mEq by mouth daily., Disp: , Rfl:   Current Facility-Administered Medications:  .  sodium chloride flush (NS)  0.9 % injection 3 mL, 3 mL, Intravenous, Q12H, Deboraha Sprang, MD  Past Medical History: Past Medical History:  Diagnosis Date  . Anemia   . Arthritis    osteoarthritis - knees and right shoulder  . Atrial fibrillation [facility])    ablation- 2x's-- 1st time- [facility], 2nd event at [facility] in 2008. Convergent ablation at [facility] 6/14  . Atrial fibrillation ([place])   . Blood transfusion without reported diagnosis   . Breast cancer ([place])    Dr Margot Chimes, total thyroidectomy- 1999- for cancer  . 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 Chronic bilateral pleural effusions   . Colon polyp    Dr Earlean Shawl  . Complete heart block ([place])   . Complication of anesthesia    Ketamine produces LSD reaction, bright colored nightmarish experience   . Dyslipidemia   . Dyspnea   . Endometriosis   . Fibroids   . H/O pleural effusion    s/p thoracentesis w 3282ml withdrawn  . Hepatitis    [place]hepatitis   . History of dysphagia    due to radiation therapy  . History of hiatal hernia    small noted on PET scan  . History of kidney stones   . Hx of thyroid cancer    Dr Forde Dandy  . Hyperlipidemia   . Hypertension   .  Hypothyroidism   . Lung cancer, lower lobe ([place]) 01/2017   radiation RX completed 03/04/17; will start chemo 6/27, pt unaware of lung cancer  . Morbid obesity ([place])    Status post lap band surgery  . Nephrolithiasis   . Non Hodgkin's lymphoma ([place])    on chemotherapy  . Persistent atrial fibrillation ([place])    a. s/p PVI 2008 b. s/p convergent ablation 0272 complicated by bradycardia requiring pacemaker implant  . Personal history of radiation therapy   . Presence of permanent cardiac pacemaker   . Rotator cuff tear    Right  . Sinus node dysfunction ([place])    Complicating convergent ablation 6/14  . Stroke [facility])    2003- [place] x2  . SVC syndrome    with lung mass and non hodgkins lymphoma  . Thyroid cancer ([place]) 2000    Tobacco Use: Social History   Tobacco Use   Smoking Status Never Smoker  Smokeless Tobacco Never Used    Labs: Recent Review Flowsheet Data    Labs for ITP Cardiac and Pulmonary Rehab Latest Ref Rng & Units 01/25/2013 01/25/2013 02/04/2013 07/26/2013 07/21/2019   Cholestrol 0 - 200 mg/dL - - - 200 -   LDLCALC 0 - 99 mg/dL - - - 116(H) -   LDLDIRECT mg/dL - - - - -   HDL >39.00 mg/dL - - - 51.80 -   Trlycerides 0.0 - 149.0 mg/dL - - - 162.0(H) -   Hemoglobin A1c 4.6 - 6.5 % - - 5.9 - -   PHART 7.350 - 7.450 - 7.437 - - -   PCO2ART 35.0 - 45.0 mmHg - 37.4 - - -   HCO3 20.0 - 28.0 mmol/L 25.8(H) 25.2(H) - - 24.9   TCO2 22 - 32 mmol/L 27 26 - - 26   O2SAT % 66.0 95.0 - - 70.0      Capillary Blood Glucose: Lab Results  Component Value Date   GLUCAP 81 12/17/2018   GLUCAP 72 08/27/2017   GLUCAP 107 (H) 02/19/2017   GLUCAP 83 12/08/2013   GLUCAP 88 12/08/2013     Pulmonary Assessment Scores: Pulmonary Assessment Scores    Row Name 08/24/19 1548 08/24/19 1603       ADL UCSD   ADL Phase  Entry  Entry    SOB Score total  -  42      CAT Score   CAT Score  -  14      mMRC Score   mMRC Score  4  -      UCSD: Self-administered rating of dyspnea associated with activities of daily living (ADLs) 6-point scale (0 = "not at all" to 5 = "maximal or unable to do because of breathlessness")  Scoring Scores range from 0 to 120.  Minimally important difference is 5 units  CAT: CAT can identify the health impairment of COPD patients and is better correlated with disease progression.  CAT has a scoring range of zero to 40. The CAT score is classified into four groups of low (less than 10), medium (10 - 20), high (21-30) and very high (31-40) based on the impact level of disease on health status. A CAT score over 10 suggests significant symptoms.  A worsening CAT score could be explained by an exacerbation, poor medication adherence, poor inhaler technique, or progression of COPD or comorbid conditions.  CAT MCID is 2  points  mMRC: mMRC (Modified Medical Research Council) Dyspnea Scale is used to assess the degree of  baseline functional disability in patients of respiratory disease due to dyspnea. No minimal important difference is established. A decrease in score of 1 point or greater is considered a positive change.   Pulmonary Function Assessment: Pulmonary Function Assessment - 08/24/19 1600      Breath   Bilateral Breath Sounds  Clear    Shortness of Breath  Yes;Limiting activity       Exercise Target Goals: Exercise Program Goal: Individual exercise prescription set using results from initial 6 min walk test and THRR while considering  patient's activity barriers and safety.   Exercise Prescription Goal: Initial exercise prescription builds to 30-45 minutes a day of aerobic activity, 2-3 days per week.  Home exercise guidelines will be given to patient during program as part of exercise prescription that the participant will acknowledge.  Activity Barriers & Risk Stratification: Activity Barriers & Cardiac Risk Stratification - 08/24/19 1503      Activity Barriers & Cardiac Risk Stratification   Activity Barriers  Arthritis;Right Knee Replacement;Deconditioning;Muscular Weakness       6 Minute Walk: 6 Minute Walk    Row Name 08/24/19 1548         6 Minute Walk   Phase  Initial     Distance  884 feet     Walk Time  6 minutes     # of Rest Breaks  0     MPH  1.67     METS  1.41     RPE  17     Perceived Dyspnea   3     VO2 Peak  4.92     Symptoms  Yes (comment)     Comments  used walking cane     Resting HR  70 bpm     Resting BP  122/76     Resting Oxygen Saturation   100 %     Exercise Oxygen Saturation  during 6 min walk  93 %     Max Ex. HR  99 bpm     Max Ex. BP  146/80     2 Minute Post BP  116/70       Interval HR   1 Minute HR  87     2 Minute HR  99     3 Minute HR  94     4 Minute HR  95     5 Minute HR  94     6 Minute HR  96     2 Minute Post HR  78      Interval Heart Rate?  Yes       Interval Oxygen   Interval Oxygen?  Yes     Baseline Oxygen Saturation %  100 %     1 Minute Oxygen Saturation %  99 %     1 Minute Liters of Oxygen  0 L     2 Minute Oxygen Saturation %  98 %     2 Minute Liters of Oxygen  0 L     3 Minute Oxygen Saturation %  94 %     3 Minute Liters of Oxygen  0 L     4 Minute Oxygen Saturation %  93 %     4 Minute Liters of Oxygen  0 L     5 Minute Oxygen Saturation %  98 %     5 Minute Liters of Oxygen  0 L     6 Minute Oxygen Saturation %  93 %  6 Minute Liters of Oxygen  0 L     2 Minute Post Oxygen Saturation %  99 %     2 Minute Post Liters of Oxygen  0 L        Oxygen Initial Assessment: Oxygen Initial Assessment - 08/24/19 1547      Home Oxygen   Home Oxygen Device  None    Sleep Oxygen Prescription  None    Home Exercise Oxygen Prescription  None    Home at Rest Exercise Oxygen Prescription  None    Compliance with Home Oxygen Use  Yes      Initial 6 min Walk   Oxygen Used  None      Program Oxygen Prescription   Program Oxygen Prescription  None      Intervention   Short Term Goals  To learn and exhibit compliance with exercise, home and travel O2 prescription;To learn and understand importance of monitoring SPO2 with pulse oximeter and demonstrate accurate use of the pulse oximeter.;To learn and understand importance of maintaining oxygen saturations>88%;To learn and demonstrate proper pursed lip breathing techniques or other breathing techniques.;To learn and demonstrate proper use of respiratory medications    Long  Term Goals  Exhibits compliance with exercise, home and travel O2 prescription;Verbalizes importance of monitoring SPO2 with pulse oximeter and return demonstration;Maintenance of O2 saturations>88%;Exhibits proper breathing techniques, such as pursed lip breathing or other method taught during program session;Compliance with respiratory medication       Oxygen  Re-Evaluation:   Oxygen Discharge (Final Oxygen Re-Evaluation):   Initial Exercise Prescription: Initial Exercise Prescription - 08/24/19 1600      Date of Initial Exercise RX and Referring Provider   Date  08/24/19    Referring Provider  Dr. Aundra Dubin      NuStep   Level  2    SPM  80    Minutes  15      Arm Ergometer   Level  1    RPM  30    Minutes  15      Prescription Details   Frequency (times per week)  2    Duration  Progress to 30 minutes of continuous aerobic without signs/symptoms of physical distress      Intensity   THRR 40-80% of Max Heartrate  58-115    Ratings of Perceived Exertion  11-15    Perceived Dyspnea  0-4      Progression   Progression  Continue to progress workloads to maintain intensity without signs/symptoms of physical distress.      Resistance Training   Training Prescription  Yes    Weight  orange bands    Reps  10-15       Perform Capillary Blood Glucose checks as needed.  Exercise Prescription Changes:   Exercise Comments:   Exercise Goals and Review: Exercise Goals    Row Name 08/24/19 1613             Exercise Goals   Increase Physical Activity  Yes       Intervention  Provide advice, education, support and counseling about physical activity/exercise needs.;Develop an individualized exercise prescription for aerobic and resistive training based on initial evaluation findings, risk stratification, comorbidities and participant's personal goals.       Expected Outcomes  Short Term: Attend rehab on a regular basis to increase amount of physical activity.;Long Term: Add in home exercise to make exercise part of routine and to increase amount of physical activity.;Long Term: Exercising  regularly at least 3-5 days a week.       Increase Strength and Stamina  Yes       Intervention  Provide advice, education, support and counseling about physical activity/exercise needs.;Develop an individualized exercise prescription for aerobic  and resistive training based on initial evaluation findings, risk stratification, comorbidities and participant's personal goals.       Expected Outcomes  Short Term: Increase workloads from initial exercise prescription for resistance, speed, and METs.;Short Term: Perform resistance training exercises routinely during rehab and add in resistance training at home;Long Term: Improve cardiorespiratory fitness, muscular endurance and strength as measured by increased METs and functional capacity (6MWT)       Able to understand and use rate of perceived exertion (RPE) scale  Yes       Intervention  Provide education and explanation on how to use RPE scale       Expected Outcomes  Short Term: Able to use RPE daily in rehab to express subjective intensity level;Long Term:  Able to use RPE to guide intensity level when exercising independently       Able to understand and use Dyspnea scale  Yes       Intervention  Provide education and explanation on how to use Dyspnea scale       Expected Outcomes  Short Term: Able to use Dyspnea scale daily in rehab to express subjective sense of shortness of breath during exertion;Long Term: Able to use Dyspnea scale to guide intensity level when exercising independently       Knowledge and understanding of Target Heart Rate Range (THRR)  Yes       Expected Outcomes  Short Term: Able to state/look up THRR;Short Term: Able to use daily as guideline for intensity in rehab;Long Term: Able to use THRR to govern intensity when exercising independently       Understanding of Exercise Prescription  Yes       Intervention  Provide education, explanation, and written materials on patient's individual exercise prescription       Expected Outcomes  Short Term: Able to explain program exercise prescription;Long Term: Able to explain home exercise prescription to exercise independently          Exercise Goals Re-Evaluation :   Discharge Exercise Prescription (Final Exercise  Prescription Changes):   Nutrition:  Target Goals: Understanding of nutrition guidelines, daily intake of sodium 1500mg , cholesterol 200mg , calories 30% from fat and 7% or less from saturated fats, daily to have 5 or more servings of fruits and vegetables.  Biometrics: Pre Biometrics - 08/24/19 1504      Pre Biometrics   Height  5\' 6"  (1.676 m)    Weight  103.5 kg    BMI (Calculated)  36.85    Grip Strength  18.5 kg        Nutrition Therapy Plan and Nutrition Goals:   Nutrition Assessments:   Nutrition Goals Re-Evaluation:   Nutrition Goals Discharge (Final Nutrition Goals Re-Evaluation):   Psychosocial: Target Goals: Acknowledge presence or absence of significant depression and/or stress, maximize coping skills, provide positive support system. Participant is able to verbalize types and ability to use techniques and skills needed for reducing stress and depression.  Initial Review & Psychosocial Screening: Initial Psych Review & Screening - 08/24/19 1605      Initial Review   Current issues with  None Identified      Family Dynamics   Good Support System?  Yes      Barriers  Psychosocial barriers to participate in program  There are no identifiable barriers or psychosocial needs.      Screening Interventions   Interventions  Encouraged to exercise       Quality of Life Scores:  Scores of 19 and below usually indicate a poorer quality of life in these areas.  A difference of  2-3 points is a clinically meaningful difference.  A difference of 2-3 points in the total score of the Quality of Life Index has been associated with significant improvement in overall quality of life, self-image, physical symptoms, and general health in studies assessing change in quality of life.  PHQ-9: Recent Review Flowsheet Data    Depression screen [facility] 2/9 08/24/2019 04/30/2017   Decreased Interest 0 0   Down, Depressed, Hopeless 0 0   PHQ - 2 Score 0 0   Altered sleeping 1 -    Tired, decreased energy 1 -   Change in appetite 0 -   Feeling bad or failure about yourself  0 -   Trouble concentrating 0 -   Moving slowly or fidgety/restless 0 -   Suicidal thoughts 0 -   Difficult doing work/chores Not difficult at all -     Interpretation of Total Score  Total Score Depression Severity:  1-4 = Minimal depression, 5-9 = Mild depression, 10-14 = Moderate depression, 15-19 = Moderately severe depression, 20-27 = Severe depression   Psychosocial Evaluation and Intervention: Psychosocial Evaluation - 08/24/19 1605      Psychosocial Evaluation & Interventions   Interventions  Encouraged to exercise with the program and follow exercise prescription    Continue Psychosocial Services   No Follow up required       Psychosocial Re-Evaluation:   Psychosocial Discharge (Final Psychosocial Re-Evaluation):   Education: Education Goals: Education classes will be provided on a weekly basis, covering required topics. Participant will state understanding/return demonstration of topics presented.  Learning Barriers/Preferences: Learning Barriers/Preferences - 08/24/19 1607      Learning Barriers/Preferences   Learning Barriers  None    Learning Preferences  Audio;Computer/Internet;Group Instruction;Individual Instruction;Pictoral;Skilled Demonstration;Verbal Instruction;Video;Written Material       Education Topics: Risk Factor Reduction:  -Group instruction that is supported by a PowerPoint presentation. Instructor discusses the definition of a risk factor, different risk factors for pulmonary disease, and how the heart and lungs work together.     Nutrition for Pulmonary Patient:  -Group instruction provided by PowerPoint slides, verbal discussion, and written materials to support subject matter. The instructor gives an explanation and review of healthy diet recommendations, which includes a discussion on weight management, recommendations for fruit and vegetable  consumption, as well as protein, fluid, caffeine, fiber, sodium, sugar, and alcohol. Tips for eating when patients are short of breath are discussed.   Pursed Lip Breathing:  -Group instruction that is supported by demonstration and informational handouts. Instructor discusses the benefits of pursed lip and diaphragmatic breathing and detailed demonstration on how to preform both.     Oxygen Safety:  -Group instruction provided by PowerPoint, verbal discussion, and written material to support subject matter. There is an overview of "What is Oxygen" and "Why do we need it".  Instructor also reviews how to create a safe environment for oxygen use, the importance of using oxygen as prescribed, and the risks of noncompliance. There is a brief discussion on traveling with oxygen and resources the patient may utilize.   Oxygen Equipment:  -Group instruction provided by [facility] Staff utilizing handouts, written materials, and  equipment demonstrations.   Signs and Symptoms:  -Group instruction provided by written material and verbal discussion to support subject matter. Warning signs and symptoms of infection, stroke, and heart attack are reviewed and when to call the physician/911 reinforced. Tips for preventing the spread of infection discussed.   Advanced Directives:  -Group instruction provided by verbal instruction and written material to support subject matter. Instructor reviews Advanced Directive laws and proper instruction for filling out document.   Pulmonary Video:  -Group video education that reviews the importance of medication and oxygen compliance, exercise, good nutrition, pulmonary hygiene, and pursed lip and diaphragmatic breathing for the pulmonary patient.   Exercise for the Pulmonary Patient:  -Group instruction that is supported by a PowerPoint presentation. Instructor discusses benefits of exercise, core components of exercise, frequency, duration, and intensity of an  exercise routine, importance of utilizing pulse oximetry during exercise, safety while exercising, and options of places to exercise outside of rehab.     Pulmonary Medications:  -Verbally interactive group education provided by instructor with focus on inhaled medications and proper administration.   Anatomy and Physiology of the Respiratory System and Intimacy:  -Group instruction provided by PowerPoint, verbal discussion, and written material to support subject matter. Instructor reviews respiratory cycle and anatomical components of the respiratory system and their functions. Instructor also reviews differences in obstructive and restrictive respiratory diseases with examples of each. Intimacy, Sex, and Sexuality differences are reviewed with a discussion on how relationships can change when diagnosed with pulmonary disease. Common sexual concerns are reviewed.   MD DAY -A group question and answer session with a medical doctor that allows participants to ask questions that relate to their pulmonary disease state.   OTHER EDUCATION -Group or individual verbal, written, or video instructions that support the educational goals of the pulmonary rehab program.   Holiday Eating Survival Tips:  -Group instruction provided by PowerPoint slides, verbal discussion, and written materials to support subject matter. The instructor gives patients tips, tricks, and techniques to help them not only survive but enjoy the holidays despite the onslaught of food that accompanies the holidays.   Knowledge Questionnaire Score:   Core Components/Risk Factors/Patient Goals at Admission: Personal Goals and Risk Factors at Admission - 08/24/19 1608      Core Components/Risk Factors/Patient Goals on Admission   Improve shortness of breath with ADL's  Yes    Intervention  Provide education, individualized exercise plan and daily activity instruction to help decrease symptoms of SOB with activities of daily  living.    Expected Outcomes  Short Term: Improve cardiorespiratory fitness to achieve a reduction of symptoms when performing ADLs;Long Term: Be able to perform more ADLs without symptoms or delay the onset of symptoms       Core Components/Risk Factors/Patient Goals Review:  Goals and Risk Factor Review    Row Name 08/24/19 1609             Core Components/Risk Factors/Patient Goals Review   Personal Goals Review  Develop more efficient breathing techniques such as purse lipped breathing and diaphragmatic breathing and practicing self-pacing with activity.;Improve shortness of breath with ADL's          Core Components/Risk Factors/Patient Goals at Discharge (Final Review):  Goals and Risk Factor Review - 08/24/19 1609      Core Components/Risk Factors/Patient Goals Review   Personal Goals Review  Develop more efficient breathing techniques such as purse lipped breathing and diaphragmatic breathing and practicing self-pacing with activity.;Improve shortness of  breath with ADL's       ITP Comments:   Comments:

## 2019-08-25 ENCOUNTER — Inpatient Hospital Stay: Payer: Medicare Other | Attending: Hematology and Oncology | Admitting: Hematology and Oncology

## 2019-08-25 ENCOUNTER — Other Ambulatory Visit: Payer: Self-pay

## 2019-08-25 DIAGNOSIS — R222 Localized swelling, mass and lump, trunk: Secondary | ICD-10-CM | POA: Insufficient documentation

## 2019-08-25 DIAGNOSIS — I871 Compression of vein: Secondary | ICD-10-CM | POA: Insufficient documentation

## 2019-08-25 DIAGNOSIS — J90 Pleural effusion, not elsewhere classified: Secondary | ICD-10-CM | POA: Insufficient documentation

## 2019-08-25 DIAGNOSIS — C8522 Mediastinal (thymic) large B-cell lymphoma, intrathoracic lymph nodes: Secondary | ICD-10-CM | POA: Diagnosis not present

## 2019-08-25 DIAGNOSIS — Z888 Allergy status to other drugs, medicaments and biological substances status: Secondary | ICD-10-CM | POA: Diagnosis not present

## 2019-08-25 DIAGNOSIS — Z7901 Long term (current) use of anticoagulants: Secondary | ICD-10-CM | POA: Diagnosis not present

## 2019-08-25 DIAGNOSIS — Z936 Other artificial openings of urinary tract status: Secondary | ICD-10-CM | POA: Diagnosis not present

## 2019-08-25 DIAGNOSIS — Z79899 Other long term (current) drug therapy: Secondary | ICD-10-CM | POA: Insufficient documentation

## 2019-08-25 DIAGNOSIS — I4892 Unspecified atrial flutter: Secondary | ICD-10-CM | POA: Insufficient documentation

## 2019-08-25 DIAGNOSIS — Z87442 Personal history of urinary calculi: Secondary | ICD-10-CM | POA: Insufficient documentation

## 2019-08-25 DIAGNOSIS — J387 Other diseases of larynx: Secondary | ICD-10-CM | POA: Insufficient documentation

## 2019-08-25 NOTE — Assessment & Plan Note (Signed)
02/03/2017: Large superior mediastinal mass contiguous with right hilar region encasing the trachea and right mainstem bronchus measuring 5.4 x 7.5 x 6 cm causing SVC obstruction, additional mediastinal lymph nodes subcarinal 1.6 cm and left hilar 1 cm; multiple small pulmonary nodules 4-6 mm   Bronchoscopy and biopsy 02/09/2017: Monoclonal B-cell population expressing CD10 consistent with non-Hodgkin's B-cell lymphoma germinal center origin Bone marrow biopsy 02/21/2017: No evidence of lymphoma PET/CT scan 41/44/3601: Hypermetabolic right paratracheal nodal mass  R CHOP x6 cycles completed 07/16/2017 ---------------------------------------------------------------------- Atrial flutter: Cardioversion by Dr. Acie Fredrickson 07/18/2017 PET/CT scan 08/27/2017:Hypermetabolic right paratracheal nodal mass February 2019: Renal stone status post nephrostomy  CT chest abdomen pelvis 02/17/2018: New patchy consolidation right lower lobe infection/inflammation, postradiation changes in the mediastinum. No evidence of lymphoma recurrence  Lung infiltrates:  CT CAP 08/13/2019: Stable posttreatment changes in the right lung and mediastinum.  Tree-in-bud opacities and branching nodularity right lower lobe similar to prior study.  Stable 6 mm left lung nodule.  Mild esophageal thickening Instructed her to follow-up with pulmonary regarding the lung findings.  There is no evidence of recurrence of lymphoma. Return to clinic in 1 year with labs scans and follow-up.

## 2019-08-26 ENCOUNTER — Telehealth: Payer: Self-pay | Admitting: Hematology and Oncology

## 2019-08-26 NOTE — Telephone Encounter (Signed)
I talk with patient regarding schedule  

## 2019-08-31 ENCOUNTER — Encounter (HOSPITAL_COMMUNITY): Payer: Medicare Other

## 2019-08-31 ENCOUNTER — Telehealth (HOSPITAL_COMMUNITY): Payer: Self-pay | Admitting: Endocrinology

## 2019-09-02 ENCOUNTER — Encounter (HOSPITAL_COMMUNITY)
Admission: RE | Admit: 2019-09-02 | Discharge: 2019-09-02 | Disposition: A | Discharge status: Home / Self Care | Payer: Medicare Other | Source: Ambulatory Visit | Attending: Cardiology | Admitting: Cardiology

## 2019-09-02 ENCOUNTER — Other Ambulatory Visit: Payer: Self-pay

## 2019-09-02 DIAGNOSIS — I5032 Chronic diastolic (congestive) heart failure: Secondary | ICD-10-CM

## 2019-09-02 NOTE — Progress Notes (Signed)
Kayla Harrison 73 y.o. female Nutrition Note   Past Medical History:  Diagnosis Date  . Anemia   . Arthritis    osteoarthritis - knees and right shoulder  . Atrial fibrillation [facility])    ablation- 2x's-- 1st time- [facility], 2nd event at [facility] in 2008. Convergent ablation at [facility] 6/14  . Atrial fibrillation ([place])   . Blood transfusion without reported diagnosis   . Breast cancer ([place])    Dr Margot Chimes, total thyroidectomy- 1999- for cancer  . 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 Chronic bilateral pleural effusions   . Colon polyp    Dr Earlean Shawl  . Complete heart block ([place])   . Complication of anesthesia    Ketamine produces LSD reaction, bright colored nightmarish experience   . Dyslipidemia   . Dyspnea   . Endometriosis   . Fibroids   . H/O pleural effusion    s/p thoracentesis w 3218ml withdrawn  . Hepatitis    [place]hepatitis   . History of dysphagia    due to radiation therapy  . History of hiatal hernia    small noted on PET scan  . History of kidney stones   . Hx of thyroid cancer    Dr Forde Dandy  . Hyperlipidemia   . Hypertension   . Hypothyroidism   . Lung cancer, lower lobe ([place]) 01/2017   radiation RX completed 03/04/17; will start chemo 6/27, pt unaware of lung cancer  . Morbid obesity ([place])    Status post lap band surgery  . Nephrolithiasis   . Non Hodgkin's lymphoma ([place])    on chemotherapy  . Persistent atrial fibrillation ([place])    a. s/p PVI 2008 b. s/p convergent ablation 9242 complicated by bradycardia requiring pacemaker implant  . Personal history of radiation therapy   . Presence of permanent cardiac pacemaker   . Rotator cuff tear    Right  . Sinus node dysfunction ([place])    Complicating convergent ablation 6/14  . Stroke [facility])    2003- [place] x2  . SVC syndrome    with lung mass and non hodgkins lymphoma  . Thyroid cancer ([place]) 2000     Medications reviewed.   Current Outpatient Medications:  .  acetaminophen  (TYLENOL) 500 MG tablet, Take 1,000 mg by mouth every 6 (six) hours as needed for moderate pain or headache., Disp: , Rfl:  .  amiodarone (PACERONE) 200 MG tablet, Take 1 tablet (200 mg total) by mouth daily., Disp: 90 tablet, Rfl: 3 .  ELIQUIS 5 MG TABS tablet, TAKE 1 TABLET BY MOUTH  TWICE DAILY (Patient taking differently: Take 5 mg by mouth 2 (two) times daily. ), Disp: 180 tablet, Rfl: 3 .  levothyroxine (SYNTHROID) 137 MCG tablet, Take 137 mcg by mouth See admin instructions. TAKE 1 TABLET (137 MCG) BY MOUTH DAILY, EXCEPT SATURDAYS, Disp: , Rfl:  .  Lifitegrast (XIIDRA) 5 % SOLN, Apply 1 drop to eye at bedtime. , Disp: , Rfl:  .  Prenatal MV & Min w/FA-DHA (PRENATAL ADULT GUMMY/DHA/FA PO), Take 1 capsule by mouth 2 (two) times daily., Disp: , Rfl:  .  rosuvastatin (CRESTOR) 10 MG tablet, Take 1 tablet by mouth 2 (two) times a week. Takes on Monday and Thursday, Disp: , Rfl:   Current Facility-Administered Medications:  .  sodium chloride flush (NS) 0.9 % injection 3 mL, 3 mL, Intravenous, Q12H, Deboraha Sprang, MD   Ht Readings from Last 1 Encounters:  08/25/19  5\' 6"  (1.676 m)     Wt Readings from Last 3 Encounters:  08/25/19 228 lb 6.4 oz (103.6 kg)  08/24/19 228 lb 2.8 oz (103.5 kg)  08/04/19 225 lb 9.6 oz (102.3 kg)     There is no height or weight on file to calculate BMI.   Social History   Tobacco Use  Smoking Status Never Smoker  Smokeless Tobacco Never Used     Lab Results  Component Value Date   CHOL 200 07/26/2013   Lab Results  Component Value Date   HDL 51.80 07/26/2013   Lab Results  Component Value Date   LDLCALC 116 (H) 07/26/2013   Lab Results  Component Value Date   TRIG 162.0 (H) 07/26/2013   Lab Results  Component Value Date   CHOLHDL 4 07/26/2013     Lab Results  Component Value Date   HGBA1C 5.9 02/04/2013     CBG (last 3)  No results for input(s): GLUCAP in the last 72 hours.   Nutrition Note  Spoke with pt. Nutrition Plan and  Nutrition Survey goals reviewed with pt. Pt is following a Healthy diet. Pt had lap band about 10 years ago. She lost 87 lbs and has gained about 20 lbs this year d/t lack of activity. During pandemic, she was not able to utilize the gym. She had been going to the gym 3 days per week for 2 hours. Pt still eats a low calorie diet with a goal of increasing protein. No concerns with diet. Encouraged more activity and to follow exercise prescription.  Pt with dx of CHF. Per discussion, pt does not use canned/convenience foods often. Pt does not add salt to food. Pt does not eat out frequently.   Pt expressed understanding of the information reviewed.    Nutrition Diagnosis ? Food-and nutrition-related knowledge deficit related to lack of exposure to information as related to diagnosis of: ? CHF ? Obese  II = 35-39.9 related to excessive energy intake as evidenced by a 36.8  Nutrition Intervention ? Pt's individual nutrition plan reviewed with pt. ? Benefits of adopting Heart Healthy diet discussed when Med[employer] reviewed.   ? Continue client-centered nutrition education by RD, as part of interdisciplinary care.  Goal(s) ? Pt to identify and limit food sources of saturated fat, trans fat, refined carbohydrates and sodium ? Pt to identify food quantities necessary to achieve weight loss of 6-24 lb at graduation from cardiac rehab.   Plan:   Will provide client-centered nutrition education as part of interdisciplinary care  Monitor and evaluate progress toward nutrition goal with team.   Michaele Offer, MS, RDN, LDN

## 2019-09-02 NOTE — Progress Notes (Signed)
Daily Session Note  Patient Details  Name: Kayla Harrison MRN: [id number] Date of Birth: January 16, 1946 Referring Provider:     Pulmonary Rehab Walk Test from 08/24/2019 in [place]  Referring Provider  Dr. Aundra Dubin      Encounter Date: 09/02/2019  Check In: Session Check In - 09/02/19 1508      Check-In   Supervising physician immediately available to respond to emergencies  [facility] immediately available    Physician(s)  Dr. Benny Lennert    Location  [facility]    Staff Present  Rosebud Poles, RN, Bjorn Loser, MS, Exercise Physiologist;Vickee Mormino [name], RN    Virtual Visit  No    Medication changes reported      No    Fall or balance concerns reported     No    Tobacco Cessation  No Change    Warm-up and Cool-down  Performed as group-led instruction    Resistance Training Performed  Yes    VAD Patient?  No    PAD/SET Patient?  No      Pain Assessment   Currently in Pain?  No/denies    Multiple Pain Sites  No       Capillary Blood Glucose: No results found. However, due to the size of the patient record, not all encounters were searched. Please check Results Review for a complete set of results.    Social History   Tobacco Use  Smoking Status Never Smoker  Smokeless Tobacco Never Used    Goals Met:  Exercise tolerated well No report of cardiac concerns or symptoms Strength training completed today  Goals Unmet:  Not Applicable  Comments: Service time is from 1303 to 1420    Dr. Rush Farmer is Medical Director for Pulmonary Rehab at [facility].

## 2019-09-06 ENCOUNTER — Ambulatory Visit (HOSPITAL_COMMUNITY)
Admission: RE | Admit: 2019-09-06 | Discharge: 2019-09-06 | Disposition: A | Discharge status: Home / Self Care | Payer: Medicare Other | Source: Ambulatory Visit | Attending: Cardiology | Admitting: Cardiology

## 2019-09-06 ENCOUNTER — Encounter (HOSPITAL_COMMUNITY): Payer: Self-pay | Admitting: Cardiology

## 2019-09-06 ENCOUNTER — Other Ambulatory Visit: Payer: Self-pay

## 2019-09-06 VITALS — BP 147/69 | HR 72 | Wt 228.8 lb

## 2019-09-06 DIAGNOSIS — Z79899 Other long term (current) drug therapy: Secondary | ICD-10-CM | POA: Insufficient documentation

## 2019-09-06 DIAGNOSIS — I5032 Chronic diastolic (congestive) heart failure: Secondary | ICD-10-CM | POA: Diagnosis not present

## 2019-09-06 DIAGNOSIS — E039 Hypothyroidism, unspecified: Secondary | ICD-10-CM | POA: Diagnosis not present

## 2019-09-06 DIAGNOSIS — E669 Obesity, unspecified: Secondary | ICD-10-CM | POA: Insufficient documentation

## 2019-09-06 DIAGNOSIS — I480 Paroxysmal atrial fibrillation: Secondary | ICD-10-CM | POA: Insufficient documentation

## 2019-09-06 DIAGNOSIS — I442 Atrioventricular block, complete: Secondary | ICD-10-CM | POA: Diagnosis not present

## 2019-09-06 DIAGNOSIS — Z6836 Body mass index (BMI) 36.0-36.9, adult: Secondary | ICD-10-CM | POA: Insufficient documentation

## 2019-09-06 DIAGNOSIS — Z8249 Family history of ischemic heart disease and other diseases of the circulatory system: Secondary | ICD-10-CM | POA: Insufficient documentation

## 2019-09-06 DIAGNOSIS — R0609 Other forms of dyspnea: Secondary | ICD-10-CM | POA: Insufficient documentation

## 2019-09-06 DIAGNOSIS — Z7901 Long term (current) use of anticoagulants: Secondary | ICD-10-CM | POA: Diagnosis not present

## 2019-09-06 DIAGNOSIS — E785 Hyperlipidemia, unspecified: Secondary | ICD-10-CM | POA: Diagnosis not present

## 2019-09-06 DIAGNOSIS — I272 Pulmonary hypertension, unspecified: Secondary | ICD-10-CM | POA: Insufficient documentation

## 2019-09-06 DIAGNOSIS — Z8572 Personal history of non-Hodgkin lymphomas: Secondary | ICD-10-CM | POA: Diagnosis not present

## 2019-09-06 DIAGNOSIS — I340 Nonrheumatic mitral (valve) insufficiency: Secondary | ICD-10-CM | POA: Insufficient documentation

## 2019-09-06 DIAGNOSIS — I110 Hypertensive heart disease with heart failure: Secondary | ICD-10-CM | POA: Insufficient documentation

## 2019-09-06 NOTE — Patient Instructions (Signed)
Please call Kayla Harrison to schedule your follow up for June 2021  If you have any questions or concerns before your next appointment please send Kayla a message through [place] or call our office at [phone].  At the [facility], you and your health needs are our priority. As part of our continuing mission to provide you with exceptional heart care, we have created designated Provider Care Teams. These Care Teams include your primary Cardiologist (physician) and Advanced Practice Providers (APPs- Physician Assistants and Nurse Practitioners) who all work together to provide you with the care you need, when you need it.   You Harrison see any of the following providers on your designated Care Team at your next follow up: Marland Kitchen Dr Glori Bickers . Dr Loralie Champagne . Darrick Grinder, NP . Lyda Jester, PA . Audry Riles, PharmD   Please be sure to bring in all your medications bottles to every appointment.

## 2019-09-07 ENCOUNTER — Ambulatory Visit (INDEPENDENT_AMBULATORY_CARE_PROVIDER_SITE_OTHER): Payer: Medicare Other | Admitting: *Deleted

## 2019-09-07 ENCOUNTER — Encounter (HOSPITAL_COMMUNITY)
Admission: RE | Admit: 2019-09-07 | Discharge: 2019-09-07 | Disposition: A | Discharge status: Home / Self Care | Payer: Medicare Other | Source: Ambulatory Visit | Attending: Cardiology | Admitting: Cardiology

## 2019-09-07 VITALS — Wt 226.0 lb

## 2019-09-07 DIAGNOSIS — Z950 Presence of cardiac pacemaker: Secondary | ICD-10-CM | POA: Diagnosis not present

## 2019-09-07 DIAGNOSIS — I5032 Chronic diastolic (congestive) heart failure: Secondary | ICD-10-CM

## 2019-09-07 LAB — CUP PACEART REMOTE DEVICE CHECK
Battery Remaining Longevity: 45 mo
Battery Voltage: 2.98 V
Brady Statistic AP VP Percent: 0.1 %
Brady Statistic AP VS Percent: 23.05 %
Brady Statistic AS VP Percent: 0.05 %
Brady Statistic AS VS Percent: 76.8 %
Brady Statistic RA Percent Paced: 22.84 %
Brady Statistic RV Percent Paced: 0.15 %
Date Time Interrogation Session: 20201215130057
Implantable Lead Implant Date: 20140618
Implantable Lead Implant Date: 20140618
Implantable Lead Location: 753859
Implantable Lead Location: 753860
Implantable Pulse Generator Implant Date: 20140618
Lead Channel Impedance Value: 1007 Ohm
Lead Channel Impedance Value: 342 Ohm
Lead Channel Impedance Value: 437 Ohm
Lead Channel Impedance Value: 950 Ohm
Lead Channel Pacing Threshold Amplitude: 0.75 V
Lead Channel Pacing Threshold Amplitude: 0.875 V
Lead Channel Pacing Threshold Pulse Width: 0.4 ms
Lead Channel Pacing Threshold Pulse Width: 0.4 ms
Lead Channel Sensing Intrinsic Amplitude: 0.5 mV
Lead Channel Sensing Intrinsic Amplitude: 0.5 mV
Lead Channel Sensing Intrinsic Amplitude: 7.875 mV
Lead Channel Sensing Intrinsic Amplitude: 7.875 mV
Lead Channel Setting Pacing Amplitude: 2.5 V
Lead Channel Setting Pacing Amplitude: 2.5 V
Lead Channel Setting Pacing Pulse Width: 0.4 ms
Lead Channel Setting Sensing Sensitivity: 0.9 mV

## 2019-09-07 NOTE — Progress Notes (Signed)
Pulmonary Individual Treatment Plan  Patient Details  Name: Kayla Harrison MRN: [id number] Date of Birth: [birth year] Referring Provider:     Pulmonary Rehab Walk Test from 08/24/2019 in [place]  Referring Provider  Dr. Aundra Dubin      Initial Encounter Date:    Pulmonary Rehab Walk Test from 08/24/2019 in [place]  Date  08/24/19      Visit Diagnosis: Heart failure, diastolic, chronic ([place])  Patient's Home Medications on Admission:   Current Outpatient Medications:  .  acetaminophen (TYLENOL) 500 MG tablet, Take 1,000 mg by mouth every 6 (six) hours as needed for moderate pain or headache., Disp: , Rfl:  .  amiodarone (PACERONE) 200 MG tablet, Take 1 tablet (200 mg total) by mouth daily., Disp: 90 tablet, Rfl: 3 .  apixaban (ELIQUIS) 5 MG TABS tablet, Take 5 mg by mouth 2 (two) times daily., Disp: , Rfl:  .  levothyroxine (SYNTHROID) 137 MCG tablet, Take 137 mcg by mouth See admin instructions. TAKE 1 TABLET (137 MCG) BY MOUTH DAILY, EXCEPT SATURDAYS, Disp: , Rfl:  .  Lifitegrast (XIIDRA) 5 % SOLN, Apply 1 drop to eye at bedtime. , Disp: , Rfl:  .  Prenatal MV & Min w/FA-DHA (PRENATAL ADULT GUMMY/DHA/FA PO), Take 1 capsule by mouth 2 (two) times daily., Disp: , Rfl:  .  rosuvastatin (CRESTOR) 10 MG tablet, Take 1 tablet by mouth 2 (two) times a week. Takes on Monday and Thursday, Disp: , Rfl:   Current Facility-Administered Medications:  .  sodium chloride flush (NS) 0.9 % injection 3 mL, 3 mL, Intravenous, Q12H, Deboraha Sprang, MD  Past Medical History: Past Medical History:  Diagnosis Date  . Anemia   . Arthritis    osteoarthritis - knees and right shoulder  . Atrial fibrillation [facility])    ablation- 2x's-- 1st time- [facility], 2nd event at [facility] in 2008. Convergent ablation at [facility] 6/14  . Atrial fibrillation ([place])   . Blood transfusion without reported diagnosis   . Breast cancer ([place])    Dr Margot Chimes, total  thyroidectomy- 1999- for cancer  . 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 Chronic bilateral pleural effusions   . Colon polyp    Dr Earlean Shawl  . Complete heart block ([place])   . Complication of anesthesia    Ketamine produces LSD reaction, bright colored nightmarish experience   . Dyslipidemia   . Dyspnea   . Endometriosis   . Fibroids   . H/O pleural effusion    s/p thoracentesis w 3244m withdrawn  . Hepatitis    [place]hepatitis   . History of dysphagia    due to radiation therapy  . History of hiatal hernia    small noted on PET scan  . History of kidney stones   . Hx of thyroid cancer    Dr SForde Dandy . Hyperlipidemia   . Hypertension   . Hypothyroidism   . [place]   radiation RX completed 03/04/17; will start chemo 6/27, pt unaware of lung cancer  . Morbid obesity ([facility]    Status post lap band surgery  . Nephrolithiasis   . Non Hodgkin's lymphoma ([place]    on chemotherapy  . Persistent atrial fibrillation ([place]    a. s/p PVI 2008 b. s/p convergent ablation 26803complicated by bradycardia requiring pacemaker implant  . Personal history of radiation therapy   . Presence of permanent  cardiac pacemaker   . Rotator cuff tear    Right  . Sinus node dysfunction ([place])    Complicating convergent ablation 6/14  . Stroke [facility])    2003- [place] x2  . SVC syndrome    with lung mass and non hodgkins lymphoma  . Thyroid cancer ([place]) 2000    Tobacco Use: Social History   Tobacco Use  Smoking Status Never Smoker  Smokeless Tobacco Never Used    Labs: Recent Review Flowsheet Data    Labs for ITP Cardiac and Pulmonary Rehab Latest Ref Rng & Units 01/25/2013 01/25/2013 02/04/2013 07/26/2013 07/21/2019   Cholestrol 0 - 200 mg/dL - - - 200 -   LDLCALC 0 - 99 mg/dL - - - 116(H) -   LDLDIRECT mg/dL - - - - -   HDL >39.00 mg/dL - - - 51.80 -   Trlycerides 0.0 - 149.0 mg/dL - - - 162.0(H) -   Hemoglobin A1c 4.6 - 6.5 % - - 5.9 - -   PHART  7.350 - 7.450 - 7.437 - - -   PCO2ART 35.0 - 45.0 mmHg - 37.4 - - -   HCO3 20.0 - 28.0 mmol/L 25.8(H) 25.2(H) - - 24.9   TCO2 22 - 32 mmol/L 27 26 - - 26   O2SAT % 66.0 95.0 - - 70.0      Capillary Blood Glucose: Lab Results  Component Value Date   GLUCAP 81 12/17/2018   GLUCAP 72 08/27/2017   GLUCAP 107 (H) 02/19/2017   GLUCAP 83 12/08/2013   GLUCAP 88 12/08/2013     Pulmonary Assessment Scores: Pulmonary Assessment Scores    Row Name 08/24/19 1548 08/24/19 1603       ADL UCSD   ADL Phase  Entry  Entry    SOB Score total  --  42      CAT Score   CAT Score  --  14      mMRC Score   mMRC Score  4  --      UCSD: Self-administered rating of dyspnea associated with activities of daily living (ADLs) 6-point scale (0 = "not at all" to 5 = "maximal or unable to do because of breathlessness")  Scoring Scores range from 0 to 120.  Minimally important difference is 5 units  CAT: CAT can identify the health impairment of COPD patients and is better correlated with disease progression.  CAT has a scoring range of zero to 40. The CAT score is classified into four groups of low (less than 10), medium (10 - 20), high (21-30) and very high (31-40) based on the impact level of disease on health status. A CAT score over 10 suggests significant symptoms.  A worsening CAT score could be explained by an exacerbation, poor medication adherence, poor inhaler technique, or progression of COPD or comorbid conditions.  CAT MCID is 2 points  mMRC: mMRC (Modified Medical Research Council) Dyspnea Scale is used to assess the degree of baseline functional disability in patients of respiratory disease due to dyspnea. No minimal important difference is established. A decrease in score of 1 point or greater is considered a positive change.   Pulmonary Function Assessment: Pulmonary Function Assessment - 08/24/19 1600      Breath   Bilateral Breath Sounds  Clear    Shortness of Breath   Yes;Limiting activity       Exercise Target Goals: Exercise Program Goal: Individual exercise prescription set using results from initial 6 min walk test and THRR  while considering  patient's activity barriers and safety.   Exercise Prescription Goal: Initial exercise prescription builds to 30-45 minutes a day of aerobic activity, 2-3 days per week.  Home exercise guidelines will be given to patient during program as part of exercise prescription that the participant will acknowledge.  Activity Barriers & Risk Stratification: Activity Barriers & Cardiac Risk Stratification - 08/24/19 1503      Activity Barriers & Cardiac Risk Stratification   Activity Barriers  Arthritis;Right Knee Replacement;Deconditioning;Muscular Weakness       6 Minute Walk: 6 Minute Walk    Row Name 08/24/19 1548         6 Minute Walk   Phase  Initial     Distance  884 feet     Walk Time  6 minutes     # of Rest Breaks  0     MPH  1.67     METS  1.41     RPE  17     Perceived Dyspnea   3     VO2 Peak  4.92     Symptoms  Yes (comment)     Comments  used walking cane     Resting HR  70 bpm     Resting BP  122/76     Resting Oxygen Saturation   100 %     Exercise Oxygen Saturation  during 6 min walk  93 %     Max Ex. HR  99 bpm     Max Ex. BP  146/80     2 Minute Post BP  116/70       Interval HR   1 Minute HR  87     2 Minute HR  99     3 Minute HR  94     4 Minute HR  95     5 Minute HR  94     6 Minute HR  96     2 Minute Post HR  78     Interval Heart Rate?  Yes       Interval Oxygen   Interval Oxygen?  Yes     Baseline Oxygen Saturation %  100 %     1 Minute Oxygen Saturation %  99 %     1 Minute Liters of Oxygen  0 L     2 Minute Oxygen Saturation %  98 %     2 Minute Liters of Oxygen  0 L     3 Minute Oxygen Saturation %  94 %     3 Minute Liters of Oxygen  0 L     4 Minute Oxygen Saturation %  93 %     4 Minute Liters of Oxygen  0 L     5 Minute Oxygen Saturation %  98 %      5 Minute Liters of Oxygen  0 L     6 Minute Oxygen Saturation %  93 %     6 Minute Liters of Oxygen  0 L     2 Minute Post Oxygen Saturation %  99 %     2 Minute Post Liters of Oxygen  0 L        Oxygen Initial Assessment: Oxygen Initial Assessment - 08/24/19 1547      Home Oxygen   Home Oxygen Device  None    Sleep Oxygen Prescription  None    Home Exercise Oxygen Prescription  None    Home  at Rest Exercise Oxygen Prescription  None    Compliance with Home Oxygen Use  Yes      Initial 6 min Walk   Oxygen Used  None      Program Oxygen Prescription   Program Oxygen Prescription  None      Intervention   Short Term Goals  To learn and exhibit compliance with exercise, home and travel O2 prescription;To learn and understand importance of monitoring SPO2 with pulse oximeter and demonstrate accurate use of the pulse oximeter.;To learn and understand importance of maintaining oxygen saturations>88%;To learn and demonstrate proper pursed lip breathing techniques or other breathing techniques.;To learn and demonstrate proper use of respiratory medications    Long  Term Goals  Exhibits compliance with exercise, home and travel O2 prescription;Verbalizes importance of monitoring SPO2 with pulse oximeter and return demonstration;Maintenance of O2 saturations>88%;Exhibits proper breathing techniques, such as pursed lip breathing or other method taught during program session;Compliance with respiratory medication       Oxygen Re-Evaluation: Oxygen Re-Evaluation    [place] Name 09/06/19 1011             Program Oxygen Prescription   Program Oxygen Prescription  None         Home Oxygen   Home Oxygen Device  None       Sleep Oxygen Prescription  None       Home Exercise Oxygen Prescription  None       Home at Rest Exercise Oxygen Prescription  None       Compliance with Home Oxygen Use  Yes         Goals/Expected Outcomes   Short Term Goals  To learn and exhibit compliance with  exercise, home and travel O2 prescription;To learn and understand importance of monitoring SPO2 with pulse oximeter and demonstrate accurate use of the pulse oximeter.;To learn and understand importance of maintaining oxygen saturations>88%;To learn and demonstrate proper pursed lip breathing techniques or other breathing techniques.;To learn and demonstrate proper use of respiratory medications       Long  Term Goals  Exhibits compliance with exercise, home and travel O2 prescription;Verbalizes importance of monitoring SPO2 with pulse oximeter and return demonstration;Maintenance of O2 saturations>88%;Exhibits proper breathing techniques, such as pursed lip breathing or other method taught during program session;Compliance with respiratory medication       Goals/Expected Outcomes  compliance          Oxygen Discharge (Final Oxygen Re-Evaluation): Oxygen Re-Evaluation - 09/06/19 1011      Program Oxygen Prescription   Program Oxygen Prescription  None      Home Oxygen   Home Oxygen Device  None    Sleep Oxygen Prescription  None    Home Exercise Oxygen Prescription  None    Home at Rest Exercise Oxygen Prescription  None    Compliance with Home Oxygen Use  Yes      Goals/Expected Outcomes   Short Term Goals  To learn and exhibit compliance with exercise, home and travel O2 prescription;To learn and understand importance of monitoring SPO2 with pulse oximeter and demonstrate accurate use of the pulse oximeter.;To learn and understand importance of maintaining oxygen saturations>88%;To learn and demonstrate proper pursed lip breathing techniques or other breathing techniques.;To learn and demonstrate proper use of respiratory medications    Long  Term Goals  Exhibits compliance with exercise, home and travel O2 prescription;Verbalizes importance of monitoring SPO2 with pulse oximeter and return demonstration;Maintenance of O2 saturations>88%;Exhibits proper breathing techniques, such as pursed  lip breathing or other method taught during program session;Compliance with respiratory medication    Goals/Expected Outcomes  compliance       Initial Exercise Prescription: Initial Exercise Prescription - 08/24/19 1600      Date of Initial Exercise RX and Referring Provider   Date  08/24/19    Referring Provider  Dr. Aundra Dubin      NuStep   Level  2    SPM  80    Minutes  15      Arm Ergometer   Level  1    RPM  30    Minutes  15      Prescription Details   Frequency (times per week)  2    Duration  Progress to 30 minutes of continuous aerobic without signs/symptoms of physical distress      Intensity   THRR 40-80% of Max Heartrate  58-115    Ratings of Perceived Exertion  11-15    Perceived Dyspnea  0-4      Progression   Progression  Continue to progress workloads to maintain intensity without signs/symptoms of physical distress.      Resistance Training   Training Prescription  Yes    Weight  orange bands    Reps  10-15       Perform Capillary Blood Glucose checks as needed.  Exercise Prescription Changes: Exercise Prescription Changes    Row Name 09/07/19 1500             Response to Exercise   Blood Pressure (Admit)  100/70       Blood Pressure (Exercise)  120/60       Blood Pressure (Exit)  108/70       Heart Rate (Admit)  76 bpm       Heart Rate (Exercise)  77 bpm       Heart Rate (Exit)  72 bpm       Oxygen Saturation (Admit)  98 %       Oxygen Saturation (Exercise)  96 %       Oxygen Saturation (Exit)  100 %       Rating of Perceived Exertion (Exercise)  13       Perceived Dyspnea (Exercise)  1       Duration  Continue with 30 min of aerobic exercise without signs/symptoms of physical distress.       Intensity  -- 40-80% HRR         Progression   Progression  Continue to progress workloads to maintain intensity without signs/symptoms of physical distress.         Resistance Training   Training Prescription  Yes       Weight  orange bands        Reps  10-15       Time  10 Minutes         Interval Training   Interval Training  No         NuStep   Level  2       SPM  80       Minutes  15       METs  1.6         Arm Ergometer   Level  1       RPM  30       Minutes  15          Exercise Comments:   Exercise Goals and Review: Exercise Goals    Row  Name 08/24/19 1613 09/06/19 1012           Exercise Goals   Increase Physical Activity  Yes  Yes      Intervention  Provide advice, education, support and counseling about physical activity/exercise needs.;Develop an individualized exercise prescription for aerobic and resistive training based on initial evaluation findings, risk stratification, comorbidities and participant's personal goals.  Provide advice, education, support and counseling about physical activity/exercise needs.;Develop an individualized exercise prescription for aerobic and resistive training based on initial evaluation findings, risk stratification, comorbidities and participant's personal goals.      Expected Outcomes  Short Term: Attend rehab on a regular basis to increase amount of physical activity.;Long Term: Add in home exercise to make exercise part of routine and to increase amount of physical activity.;Long Term: Exercising regularly at least 3-5 days a week.  Short Term: Attend rehab on a regular basis to increase amount of physical activity.;Long Term: Add in home exercise to make exercise part of routine and to increase amount of physical activity.;Long Term: Exercising regularly at least 3-5 days a week.      Increase Strength and Stamina  Yes  Yes      Intervention  Provide advice, education, support and counseling about physical activity/exercise needs.;Develop an individualized exercise prescription for aerobic and resistive training based on initial evaluation findings, risk stratification, comorbidities and participant's personal goals.  Provide advice, education, support and counseling  about physical activity/exercise needs.;Develop an individualized exercise prescription for aerobic and resistive training based on initial evaluation findings, risk stratification, comorbidities and participant's personal goals.      Expected Outcomes  Short Term: Increase workloads from initial exercise prescription for resistance, speed, and METs.;Short Term: Perform resistance training exercises routinely during rehab and add in resistance training at home;Long Term: Improve cardiorespiratory fitness, muscular endurance and strength as measured by increased METs and functional capacity (6MWT)  Short Term: Increase workloads from initial exercise prescription for resistance, speed, and METs.;Short Term: Perform resistance training exercises routinely during rehab and add in resistance training at home;Long Term: Improve cardiorespiratory fitness, muscular endurance and strength as measured by increased METs and functional capacity (6MWT)      Able to understand and use rate of perceived exertion (RPE) scale  Yes  Yes      Intervention  Provide education and explanation on how to use RPE scale  Provide education and explanation on how to use RPE scale      Expected Outcomes  Short Term: Able to use RPE daily in rehab to express subjective intensity level;Long Term:  Able to use RPE to guide intensity level when exercising independently  Short Term: Able to use RPE daily in rehab to express subjective intensity level;Long Term:  Able to use RPE to guide intensity level when exercising independently      Able to understand and use Dyspnea scale  Yes  Yes      Intervention  Provide education and explanation on how to use Dyspnea scale  Provide education and explanation on how to use Dyspnea scale      Expected Outcomes  Short Term: Able to use Dyspnea scale daily in rehab to express subjective sense of shortness of breath during exertion;Long Term: Able to use Dyspnea scale to guide intensity level when  exercising independently  Short Term: Able to use Dyspnea scale daily in rehab to express subjective sense of shortness of breath during exertion;Long Term: Able to use Dyspnea scale to guide intensity level when exercising  independently      Knowledge and understanding of Target Heart Rate Range (THRR)  Yes  Yes      Expected Outcomes  Short Term: Able to state/look up THRR;Short Term: Able to use daily as guideline for intensity in rehab;Long Term: Able to use THRR to govern intensity when exercising independently  Short Term: Able to state/look up THRR;Short Term: Able to use daily as guideline for intensity in rehab;Long Term: Able to use THRR to govern intensity when exercising independently      Understanding of Exercise Prescription  Yes  Yes      Intervention  Provide education, explanation, and written materials on patient's individual exercise prescription  Provide education, explanation, and written materials on patient's individual exercise prescription      Expected Outcomes  Short Term: Able to explain program exercise prescription;Long Term: Able to explain home exercise prescription to exercise independently  Short Term: Able to explain program exercise prescription;Long Term: Able to explain home exercise prescription to exercise independently         Exercise Goals Re-Evaluation : Exercise Goals Re-Evaluation    [place] Name 09/06/19 1013             Exercise Goal Re-Evaluation   Exercise Goals Review  Increase Physical Activity;Increase Strength and Stamina;Able to understand and use rate of perceived exertion (RPE) scale;Able to understand and use Dyspnea scale;Knowledge and understanding of Target Heart Rate Range (THRR);Understanding of Exercise Prescription       Comments  Pt has completed 1 exercise session. Pt exercised at 1.5 METs on the stepper. Will continue to monitor and progress as able.       Expected Outcomes  Through exercise at rehab and at home, the patient will  decrease shortness of breath with daily activities and feel confident in carrying out an exercise regime at home.          Discharge Exercise Prescription (Final Exercise Prescription Changes): Exercise Prescription Changes - 09/07/19 1500      Response to Exercise   Blood Pressure (Admit)  100/70    Blood Pressure (Exercise)  120/60    Blood Pressure (Exit)  108/70    Heart Rate (Admit)  76 bpm    Heart Rate (Exercise)  77 bpm    Heart Rate (Exit)  72 bpm    Oxygen Saturation (Admit)  98 %    Oxygen Saturation (Exercise)  96 %    Oxygen Saturation (Exit)  100 %    Rating of Perceived Exertion (Exercise)  13    Perceived Dyspnea (Exercise)  1    Duration  Continue with 30 min of aerobic exercise without signs/symptoms of physical distress.    Intensity  --   40-80% HRR     Progression   Progression  Continue to progress workloads to maintain intensity without signs/symptoms of physical distress.      Resistance Training   Training Prescription  Yes    Weight  orange bands    Reps  10-15    Time  10 Minutes      Interval Training   Interval Training  No      NuStep   Level  2    SPM  80    Minutes  15    METs  1.6      Arm Ergometer   Level  1    RPM  30    Minutes  15       Nutrition:  Target Goals:  Understanding of nutrition guidelines, daily intake of sodium '1500mg'$ , cholesterol '200mg'$ , calories 30% from fat and 7% or less from saturated fats, daily to have 5 or more servings of fruits and vegetables.  Biometrics: Pre Biometrics - 08/24/19 1504      Pre Biometrics   Height  '5\' 6"'$  (1.676 m)    Weight  103.5 kg    BMI (Calculated)  36.85    Grip Strength  18.5 kg        Nutrition Therapy Plan and Nutrition Goals: Nutrition Therapy & Goals - 09/02/19 1513      Nutrition Therapy   Diet  Mediterranean      Personal Nutrition Goals   Nutrition Goal  Pt to identify and limit food sources of saturated fat, trans fat, refined carbohydrates and sodium     Personal Goal #2  Pt to identify food quantities necessary to achieve weight loss of 6-24 lb at graduation from cardiac rehab.      Intervention Plan   Intervention  Nutrition handout(s) given to patient.;Prescribe, educate and counsel regarding individualized specific dietary modifications aiming towards targeted core components such as weight, hypertension, lipid management, diabetes, heart failure and other comorbidities.    Expected Outcomes  Short Term Goal: Understand basic principles of dietary content, such as calories, fat, sodium, cholesterol and nutrients.;Short Term Goal: A plan has been developed with personal nutrition goals set during dietitian appointment.;Long Term Goal: Adherence to prescribed nutrition plan.       Nutrition Assessments: Nutrition Assessments - 09/02/19 1522      Rate Your Plate Scores   Pre Score  60       Nutrition Goals Re-Evaluation: Nutrition Goals Re-Evaluation    [place] Name 09/02/19 1522             Goals   Current Weight  228 lb (103.4 kg)       Nutrition Goal  Pt to identify and limit food sources of saturated fat, trans fat, refined carbohydrates and sodium         Personal Goal #2 Re-Evaluation   Personal Goal #2  Pt to identify food quantities necessary to achieve weight loss of 6-24 lb at graduation from cardiac rehab.          Nutrition Goals Discharge (Final Nutrition Goals Re-Evaluation): Nutrition Goals Re-Evaluation - 09/02/19 1522      Goals   Current Weight  228 lb (103.4 kg)    Nutrition Goal  Pt to identify and limit food sources of saturated fat, trans fat, refined carbohydrates and sodium      Personal Goal #2 Re-Evaluation   Personal Goal #2  Pt to identify food quantities necessary to achieve weight loss of 6-24 lb at graduation from cardiac rehab.       Psychosocial: Target Goals: Acknowledge presence or absence of significant depression and/or stress, maximize coping skills, provide positive support system.  Participant is able to verbalize types and ability to use techniques and skills needed for reducing stress and depression.  Initial Review & Psychosocial Screening: Initial Psych Review & Screening - 08/24/19 1605      Initial Review   Current issues with  None Identified      Family Dynamics   Good Support System?  Yes      Barriers   Psychosocial barriers to participate in program  There are no identifiable barriers or psychosocial needs.      Screening Interventions   Interventions  Encouraged to exercise  Quality of Life Scores:  Scores of 19 and below usually indicate a poorer quality of life in these areas.  A difference of  2-3 points is a clinically meaningful difference.  A difference of 2-3 points in the total score of the Quality of Life Index has been associated with significant improvement in overall quality of life, self-image, physical symptoms, and general health in studies assessing change in quality of life.  PHQ-9: Recent Review Flowsheet Data    Depression screen [facility] 2/9 08/24/2019 04/30/2017   Decreased Interest 0 0   Down, Depressed, Hopeless 0 0   PHQ - 2 Score 0 0   Altered sleeping 1 -   Tired, decreased energy 1 -   Change in appetite 0 -   Feeling bad or failure about yourself  0 -   Trouble concentrating 0 -   Moving slowly or fidgety/restless 0 -   Suicidal thoughts 0 -   Difficult doing work/chores Not difficult at all -     Interpretation of Total Score  Total Score Depression Severity:  1-4 = Minimal depression, 5-9 = Mild depression, 10-14 = Moderate depression, 15-19 = Moderately severe depression, 20-27 = Severe depression   Psychosocial Evaluation and Intervention: Psychosocial Evaluation - 08/24/19 1605      Psychosocial Evaluation & Interventions   Interventions  Encouraged to exercise with the program and follow exercise prescription    Continue Psychosocial Services   No Follow up required       Psychosocial  Re-Evaluation: Psychosocial Re-Evaluation    [place] Name 09/06/19 1313             Psychosocial Re-Evaluation   Current issues with  None Identified       Comments  No barriers or psychosocial concerns identified at this time       Expected Outcomes  That patient has no barriers or psychosocial concerns while participating in pulmonary rehab.       Interventions  Encouraged to attend Pulmonary Rehabilitation for the exercise       Continue Psychosocial Services   No Follow up required          Psychosocial Discharge (Final Psychosocial Re-Evaluation): Psychosocial Re-Evaluation - 09/06/19 1313      Psychosocial Re-Evaluation   Current issues with  None Identified    Comments  No barriers or psychosocial concerns identified at this time    Expected Outcomes  That patient has no barriers or psychosocial concerns while participating in pulmonary rehab.    Interventions  Encouraged to attend Pulmonary Rehabilitation for the exercise    Continue Psychosocial Services   No Follow up required       Education: Education Goals: Education classes will be provided on a weekly basis, covering required topics. Participant will state understanding/return demonstration of topics presented.  Learning Barriers/Preferences: Learning Barriers/Preferences - 08/24/19 1607      Learning Barriers/Preferences   Learning Barriers  None    Learning Preferences  Audio;Computer/Internet;Group Instruction;Individual Instruction;Pictoral;Skilled Demonstration;Verbal Instruction;Video;Written Material       Education Topics: Risk Factor Reduction:  -Group instruction that is supported by a PowerPoint presentation. Instructor discusses the definition of a risk factor, different risk factors for pulmonary disease, and how the heart and lungs work together.     Nutrition for Pulmonary Patient:  -Group instruction provided by PowerPoint slides, verbal discussion, and written materials to support subject  matter. The instructor gives an explanation and review of healthy diet recommendations, which includes a discussion on weight  management, recommendations for fruit and vegetable consumption, as well as protein, fluid, caffeine, fiber, sodium, sugar, and alcohol. Tips for eating when patients are short of breath are discussed.   Pursed Lip Breathing:  -Group instruction that is supported by demonstration and informational handouts. Instructor discusses the benefits of pursed lip and diaphragmatic breathing and detailed demonstration on how to preform both.     Oxygen Safety:  -Group instruction provided by PowerPoint, verbal discussion, and written material to support subject matter. There is an overview of "What is Oxygen" and "Why do we need it".  Instructor also reviews how to create a safe environment for oxygen use, the importance of using oxygen as prescribed, and the risks of noncompliance. There is a brief discussion on traveling with oxygen and resources the patient may utilize.   Oxygen Equipment:  -Group instruction provided by [facility] Staff utilizing handouts, written materials, and equipment demonstrations.   Signs and Symptoms:  -Group instruction provided by written material and verbal discussion to support subject matter. Warning signs and symptoms of infection, stroke, and heart attack are reviewed and when to call the physician/911 reinforced. Tips for preventing the spread of infection discussed.   Advanced Directives:  -Group instruction provided by verbal instruction and written material to support subject matter. Instructor reviews Advanced Directive laws and proper instruction for filling out document.   Pulmonary Video:  -Group video education that reviews the importance of medication and oxygen compliance, exercise, good nutrition, pulmonary hygiene, and pursed lip and diaphragmatic breathing for the pulmonary patient.   Exercise for the Pulmonary Patient:   -Group instruction that is supported by a PowerPoint presentation. Instructor discusses benefits of exercise, core components of exercise, frequency, duration, and intensity of an exercise routine, importance of utilizing pulse oximetry during exercise, safety while exercising, and options of places to exercise outside of rehab.     Pulmonary Medications:  -Verbally interactive group education provided by instructor with focus on inhaled medications and proper administration.   Anatomy and Physiology of the Respiratory System and Intimacy:  -Group instruction provided by PowerPoint, verbal discussion, and written material to support subject matter. Instructor reviews respiratory cycle and anatomical components of the respiratory system and their functions. Instructor also reviews differences in obstructive and restrictive respiratory diseases with examples of each. Intimacy, Sex, and Sexuality differences are reviewed with a discussion on how relationships can change when diagnosed with pulmonary disease. Common sexual concerns are reviewed.   MD DAY -A group question and answer session with a medical doctor that allows participants to ask questions that relate to their pulmonary disease state.   OTHER EDUCATION -Group or individual verbal, written, or video instructions that support the educational goals of the pulmonary rehab program.   Holiday Eating Survival Tips:  -Group instruction provided by PowerPoint slides, verbal discussion, and written materials to support subject matter. The instructor gives patients tips, tricks, and techniques to help them not only survive but enjoy the holidays despite the onslaught of food that accompanies the holidays.   Knowledge Questionnaire Score:   Core Components/Risk Factors/Patient Goals at Admission: Personal Goals and Risk Factors at Admission - 08/24/19 1608      Core Components/Risk Factors/Patient Goals on Admission   Improve shortness  of breath with ADL's  Yes    Intervention  Provide education, individualized exercise plan and daily activity instruction to help decrease symptoms of SOB with activities of daily living.    Expected Outcomes  Short Term: Improve cardiorespiratory fitness to  achieve a reduction of symptoms when performing ADLs;Long Term: Be able to perform more ADLs without symptoms or delay the onset of symptoms       Core Components/Risk Factors/Patient Goals Review:  Goals and Risk Factor Review    [place] Name 08/24/19 1609 09/06/19 1314           Core Components/Risk Factors/Patient Goals Review   Personal Goals Review  Develop more efficient breathing techniques such as purse lipped breathing and diaphragmatic breathing and practicing self-pacing with activity.;Improve shortness of breath with ADL's  Improve shortness of breath with ADL's;Increase knowledge of respiratory medications and ability to use respiratory devices properly.;Develop more efficient breathing techniques such as purse lipped breathing and diaphragmatic breathing and practicing self-pacing with activity.;Heart Failure      Review  --  Patient just started pulmonary rehab, has attended 1 exercise session, heart failure was stable at this time, will continue to evaluate, too early to have achieved program goals.      Expected Outcomes  --  See admission goals.         Core Components/Risk Factors/Patient Goals at Discharge (Final Review):  Goals and Risk Factor Review - 09/06/19 1314      Core Components/Risk Factors/Patient Goals Review   Personal Goals Review  Improve shortness of breath with ADL's;Increase knowledge of respiratory medications and ability to use respiratory devices properly.;Develop more efficient breathing techniques such as purse lipped breathing and diaphragmatic breathing and practicing self-pacing with activity.;Heart Failure    Review  Patient just started pulmonary rehab, has attended 1 exercise session, heart  failure was stable at this time, will continue to evaluate, too early to have achieved program goals.    Expected Outcomes  See admission goals.       ITP Comments:   Comments: ITP REVIEW Pt is making expected progress toward pulmonary rehab goals after completing 2 sessions. Recommend continued exercise, life style modification, education, and utilization of breathing techniques to increase stamina and strength and decrease shortness of breath with exertion.

## 2019-09-07 NOTE — Progress Notes (Signed)
Daily Session Note  Patient Details  Name: ZILAH VILLAFLOR MRN: [id number] Date of Birth: [birth year] Referring Provider:     Pulmonary Rehab Walk Test from 08/24/2019 in [place]  Referring Provider  Dr. Aundra Dubin      Encounter Date: 09/07/2019  Check In: Session Check In - 09/07/19 1505      Check-In   Supervising physician immediately available to respond to emergencies  [facility] immediately available    Physician(s)  Dr. Earnest Conroy    Location  [facility]    Staff Present  Rosebud Poles, RN, Bjorn Loser, MS, Exercise Physiologist;Lisa [name], RN    Virtual Visit  No    Medication changes reported      No    Fall or balance concerns reported     No    Tobacco Cessation  No Change    Warm-up and Cool-down  Performed on first and last piece of equipment    Resistance Training Performed  Yes    VAD Patient?  No    PAD/SET Patient?  No      Pain Assessment   Currently in Pain?  No/denies    Multiple Pain Sites  No       Capillary Blood Glucose: No results found. However, due to the size of the patient record, not all encounters were searched. Please check Results Review for a complete set of results.  Exercise Prescription Changes - 09/07/19 1500      Response to Exercise   Blood Pressure (Admit)  100/70    Blood Pressure (Exercise)  120/60    Blood Pressure (Exit)  108/70    Heart Rate (Admit)  76 bpm    Heart Rate (Exercise)  77 bpm    Heart Rate (Exit)  72 bpm    Oxygen Saturation (Admit)  98 %    Oxygen Saturation (Exercise)  96 %    Oxygen Saturation (Exit)  100 %    Rating of Perceived Exertion (Exercise)  13    Perceived Dyspnea (Exercise)  1    Duration  Continue with 30 min of aerobic exercise without signs/symptoms of physical distress.    Intensity  --   40-80% HRR     Progression   Progression  Continue to progress workloads to maintain intensity without signs/symptoms of physical  distress.      Resistance Training   Training Prescription  Yes    Weight  orange bands    Reps  10-15    Time  10 Minutes      Interval Training   Interval Training  No      NuStep   Level  2    SPM  80    Minutes  15    METs  1.6      Arm Ergometer   Level  1    RPM  30    Minutes  15       Social History   Tobacco Use  Smoking Status Never Smoker  Smokeless Tobacco Never Used    Goals Met:  Proper associated with RPD/PD & O2 Sat Exercise tolerated well Strength training completed today  Goals Unmet:  Not Applicable  Comments: Service time is from 1310 to 1425    Dr. Rush Farmer is Medical Director for Pulmonary Rehab at [facility].

## 2019-09-07 NOTE — Progress Notes (Signed)
PCP: [name], MD Cardiology: Dr. Caryl Comes HF Cardiology: Dr. Aundra Dubin  73 y.o. with history of non-Hodgkins lymphoma treated with radiation and R-CHOP, obesity with lap banding, paroxysmal atrial fibrillation, complete heart block s/p MDT PPM, and chronic dyspnea presents for followup of exertional shortness of breath.   Patient has a long history of atrial fibrillation.  She had initial ablation in 2009 at [facility], then convergent procedure in 6/14 at [facility]. This was complicated by CHB and MDT PPM was placed. She had breakthrough atrial fibrillation and is now on amiodarone.  She is in NSR today.  She has also had episodes of junctional rhythm in the past.  She has not felt palpitations.   She had Non-Hodgkins lymphoma in 2018.  This was treated with chest radiation and R-CHOP.  SVC syndrome complicated her treatment, now resolved.   She has had a long history of orthostatic intolerance.  It is worst when she takes a shower and raises her arms.  No recent falls and lightheadedness has been less lately.  Lasix has not affected her lightheadedness.    Her main complaint is exertional dyspnea.  She says that this has been present and somewhat stable for more than 2 years.  There was some concern for possible amiodarone lung toxicity.  CT chest was not suggestive of this, actually showed mild radiation changes.  PFTs showed normal spirometry, decreased DLCO.  RHC showed mildly elevated PCWP with prominent v-waves and pulmonary venous hypertension.  Echo showed EF 50-55%, mildly decreased RV systolic function.  The mitral valve was calcified, probably mild stenosis and regurgitation.  Due to concern for possible significant mitral valve disease, she had TEE in 11/20.  This showed EF 55%, normal RV, moderate TR, moderate MR, and mild mitral stenosis (mean gradient 4 mmHg).   She has stable exertional dyspnea after walking about a block or walking up a flight of stairs.  She is using Lasix prn.  No recent  significant lightheadedness or falls.  No orthopnea/PND.  She is doing pulmonary rehab and thinks that it is helpful. BP is high today but usually SBP runs 100s-110s when she checks.   ECG (personally reviewed): NSR, QTc 483  Labs (7/20): LFTs normal.  Labs (10/20): hgb 13, K 4.7, creatinine 0.91 Labs (11/20): BNP 129, K 4.5, creatinine 1.22  PMH: 1. Non-Hodgkins lymphoma: Diagnosed in 2018.  She was treated with radiation to the chest and R-CHOP.  - She developed SVC syndrome as a complication.  - Now in remission.  2. Nephrolithiasis.  3. Obesity with lap banding.  4. Atrial fibrillation: Paroxysmal.   - Ablation in 2009 at [facility].  - Convergent procedure in 6/14 at [facility].  This was complicated by CHB requiring MDT PPM.  - Now on amiodarone.  5. Complete heart block: MDT PPM.  6. Chronic diastolic CHF:  - Echo (5/64): EF 50-55%, mild diffuse hypokinesis, Moderate RV enlargement with mildly decreased systolic function, mild mitral stenosis and mild mitral regurgitation.  - RHC (10/20): mean RA 6, PA 58/23 mean 40, mean PCWP 23 with prominent v waves to 39, CI 2.5, PVR 3.25 WU.  7. Orthostatic hypotension.  8. H/o thryoid cancer.  9. H/o junctional rhythm.  10. Dyspnea:  - CT chest (7/20): radiation changes in the lungs.  - PFTs (8/20): normal spirometry, DLCO 61%.  11. Hypothyroidism 12. Hyperlipidemia 13. Mitral valve disorder: TEE (11/20) showed EF 55%, normal RV size and systolic function, moderate TR with peak RV-RA gradient 33 mmHg, moderate MR,  mild mitral stenosis (mean gradient 4 mmHg, MVA >2 cm^2).  Social History   Socioeconomic History  . Marital status: Single    Spouse name: Not on file  . Number of children: 0  . Years of education: Not on file  . Highest education level: Master's degree (e.g., MA, MS, MEng, MEd, MSW, MBA)  Occupational History  . 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 Smoking status: Never  Smoker  . Smokeless tobacco: Never Used  Substance and Sexual Activity  . Alcohol use: No    Comment: none since 1990  . Drug use: Never  . Sexual activity: Never    Birth control/protection: Surgical  Other Topics Concern  . Not on file  Social History Narrative   SINGLE    NO CHILDREN   NEVER SMOKED   EXERCISE 3 X WK   RETIRED   NO CAFFEINE         Social Determinants of Health   Financial Resource Strain:   . Difficulty of Paying Living Expenses: Not on file  Food Insecurity:   . Worried About Charity fundraiser in the Last Year: Not on file  . Ran Out of Food in the Last Year: Not on file  Transportation Needs:   . Lack of Transportation (Medical): Not on file  . Lack of Transportation (Non-Medical): Not on file  Physical Activity:   . Days of Exercise per Week: Not on file  . Minutes of Exercise per Session: Not on file  Stress:   . Feeling of Stress : Not on file  Social Connections:   . Frequency of Communication with Friends and Family: Not on file  . Frequency of Social Gatherings with Friends and Family: Not on file  . Attends Religious Services: Not on file  . Active Member of Clubs or Organizations: Not on file  . Attends Archivist Meetings: Not on file  . Marital Status: Not on file  Intimate Partner Violence:   . Fear of Current or Ex-Partner: Not on file  . Emotionally Abused: Not on file  . Physically Abused: Not on file  . Sexually Abused: Not on file   Family History  Problem Relation Age of Onset  . Heart disease Father 84       MI @autopsy   . Colon cancer Father        COLON  . Heart attack Father   . Other Mother        temporal arteritis   . Diabetes Sister   . Diabetes Brother   . Diabetes Paternal Aunt   . Diabetes Paternal Grandmother     ROS: All systems reviewed and negative except as per HPI.   Current Outpatient Medications  Medication Sig Dispense Refill  . acetaminophen (TYLENOL) 500 MG tablet Take 1,000 mg  by mouth every 6 (six) hours as needed for moderate pain or headache.    Marland Kitchen amiodarone (PACERONE) 200 MG tablet Take 1 tablet (200 mg total) by mouth daily. 90 tablet 3  . apixaban (ELIQUIS) 5 MG TABS tablet Take 5 mg by mouth 2 (two) times daily.    Marland Kitchen levothyroxine (SYNTHROID) 137 MCG tablet Take 137 mcg by mouth See admin instructions. TAKE 1 TABLET (137 MCG) BY MOUTH DAILY, EXCEPT SATURDAYS    . Lifitegrast (XIIDRA) 5 % SOLN Apply 1 drop to eye at bedtime.     . Prenatal MV & Min w/FA-DHA (PRENATAL ADULT GUMMY/DHA/FA  PO) Take 1 capsule by mouth 2 (two) times daily.    . rosuvastatin (CRESTOR) 10 MG tablet Take 1 tablet by mouth 2 (two) times a week. Takes on Monday and Thursday     Current Facility-Administered Medications  Medication Dose Route Frequency Provider Last Rate Last Admin  . sodium chloride flush (NS) 0.9 % injection 3 mL  3 mL Intravenous Q12H Deboraha Sprang, MD       BP (!) 147/69   Pulse 72   Wt 103.8 kg (228 lb 12.8 oz)   SpO2 99%   BMI 36.93 kg/m  General: NAD Neck: No JVD, no thyromegaly or thyroid nodule.  Lungs: Clear to auscultation bilaterally with normal respiratory effort. CV: Nondisplaced PMI.  Heart regular S1/S2, no S3/S4, 2/6 HSM apex.  No peripheral edema.  No carotid bruit.  Normal pedal pulses.  Abdomen: Soft, nontender, no hepatosplenomegaly, no distention.  Skin: Intact without lesions or rashes.  Neurologic: Alert and oriented x 3.  Psych: Normal affect. Extremities: No clubbing or cyanosis.  HEENT: Normal.   Assessment/Plan: 1. Exertional dyspnea: NYHA class III.  This has been present for several years now.  Extensive workup recently.  PFTs showed normal spirometry in 8/20 though DLCO was low. CT chest showed mild radiation changes in the lung.  She has seen pulmonary, and amiodarone toxicity does not appear to be a likely diagnosis.  She had an echo in 9/20 showing EF 50-55%, moderate RV enlargement with mildly decreased systolic function. The  mitral valve was abnormal with at least mild mitral stenosis and mild mitral regurgitation. Interestingly, her mother had some type of mitral valve disease (she is not sure the exact diagnosis).  [place] in 10/20 showed elevated PCWP, pulmonary venous hypertension, and prominent v-waves on the PCWP tracing.  I started her on Lasix 20 mg daily after RHC.  She says this has not made much difference and is now taking it prn. TEE was done to examine the mitral valve, there was moderate mitral regurgitation and mild mitral stenosis.  I suspect that the main issue in terms of her heart is diastolic dysfunction.  - She can continue to use Lasix prn and avoid sodium.  - Continue pulmonary rehab, this seems to be helping.  2. OSA: I do have some suspicion for OSA with daytime sleepiness and fatigue. Awaiting home sleep study.  3. Atrial fibrillation: Paroxysmal.  She is in NSR today. She has history of pulmonary vein isolation at [facility] and later convergent procedure at [facility].  She is now maintaining NSR on amiodarone.  As above, evaluation has not suggested amiodarone toxicity.   - Continue amiodarone 200 mg daily.  Follow LFTs.  She will need regular eye exam. She has hypothyroidism and is on Levoxyl.  - Continue Eliquis 5 mg bid.  4. Complete heart block: S/p convergent procedure.  Has MDT PPM.  5. Pulmonary hypertension: Pulmonary venous hypertension on RHC in 41/66, from diastolic LV dysfunction most likely.    Loralie Champagne 09/07/2019

## 2019-09-09 ENCOUNTER — Other Ambulatory Visit: Payer: Self-pay

## 2019-09-09 ENCOUNTER — Encounter (HOSPITAL_COMMUNITY)
Admission: RE | Admit: 2019-09-09 | Discharge: 2019-09-09 | Disposition: A | Discharge status: Home / Self Care | Payer: Medicare Other | Source: Ambulatory Visit | Attending: Cardiology | Admitting: Cardiology

## 2019-09-09 ENCOUNTER — Ambulatory Visit: Payer: Medicare Other

## 2019-09-09 DIAGNOSIS — R0683 Snoring: Secondary | ICD-10-CM

## 2019-09-09 DIAGNOSIS — I5032 Chronic diastolic (congestive) heart failure: Secondary | ICD-10-CM

## 2019-09-09 DIAGNOSIS — G4733 Obstructive sleep apnea (adult) (pediatric): Secondary | ICD-10-CM

## 2019-09-09 NOTE — Progress Notes (Signed)
Daily Session Note  Patient Details  Name: Kayla Harrison MRN: [id number] Date of Birth: 08/16/1946 Referring Provider:     Pulmonary Rehab Walk Test from 08/24/2019 in [place]  Referring Provider  Dr. Aundra Dubin      Encounter Date: 09/09/2019  Check In: Session Check In - 09/09/19 1442      Check-In   Supervising physician immediately available to respond to emergencies  [facility] immediately available    Physician(s)  Dr. Sloan Leiter    Location  [facility]    Staff Present  Rosebud Poles, RN, Bjorn Loser, MS, Exercise Physiologist;Kwabena Strutz [name], RN    Virtual Visit  No    Medication changes reported      No    Fall or balance concerns reported     No    Tobacco Cessation  No Change    Warm-up and Cool-down  Performed on first and last piece of equipment    Resistance Training Performed  Yes    VAD Patient?  No    PAD/SET Patient?  No      Pain Assessment   Currently in Pain?  No/denies    Multiple Pain Sites  No       Capillary Blood Glucose: No results found. However, due to the size of the patient record, not all encounters were searched. Please check Results Review for a complete set of results.    Social History   Tobacco Use  Smoking Status Never Smoker  Smokeless Tobacco Never Used    Goals Met:  Exercise tolerated well No report of cardiac concerns or symptoms Strength training completed today  Goals Unmet:  Not Applicable  Comments: Service time is from 1305 to 1415    Dr. Rush Farmer is Medical Director for Pulmonary Rehab at [facility].

## 2019-09-10 ENCOUNTER — Telehealth: Payer: Self-pay | Admitting: Pulmonary Disease

## 2019-09-10 DIAGNOSIS — G4733 Obstructive sleep apnea (adult) (pediatric): Secondary | ICD-10-CM | POA: Diagnosis not present

## 2019-09-10 NOTE — Telephone Encounter (Signed)
Called the patient and advised her of the results. She voiced understanding. Patient scheduled to see Dr. Halford Chessman 10/04/19 at 9:45. Nothing further needed at this time.

## 2019-09-10 NOTE — Telephone Encounter (Signed)
HST 09/09/19 >> AHI 18.5, SpO2 low 76%   Please inform her that her sleep study shows moderate obstructive sleep apnea.  Please arrange for ROV with me or NP to discuss treatment options.

## 2019-09-14 ENCOUNTER — Other Ambulatory Visit: Payer: Self-pay

## 2019-09-14 ENCOUNTER — Encounter (HOSPITAL_COMMUNITY)
Admission: RE | Admit: 2019-09-14 | Discharge: 2019-09-14 | Disposition: A | Discharge status: Home / Self Care | Payer: Medicare Other | Source: Ambulatory Visit | Attending: Cardiology | Admitting: Cardiology

## 2019-09-14 DIAGNOSIS — I5032 Chronic diastolic (congestive) heart failure: Secondary | ICD-10-CM

## 2019-09-14 NOTE — Progress Notes (Signed)
Daily Session Note  Patient Details  Name: Kayla Harrison MRN: [id number] Date of Birth: 11/03/1945 Referring Provider:     Pulmonary Rehab Walk Test from 08/24/2019 in [facility]  Referring Provider  Dr. McLean      Encounter Date: 09/14/2019  Check In: Session Check In - 09/14/19 1404      Check-In   Supervising physician immediately available to respond to emergencies  [facility] immediately available    Physician(s)  Dr, Hall    Location  [facility]    Staff Present  Joan Behrens, RN, BSN; , MS, Exercise Physiologist;Lisa Hughes, RN    Virtual Visit  No    Medication changes reported      No    Fall or balance concerns reported     No    Tobacco Cessation  No Change    Warm-up and Cool-down  Performed on first and last piece of equipment    Resistance Training Performed  Yes    VAD Patient?  No    PAD/SET Patient?  No      Pain Assessment   Currently in Pain?  No/denies    Multiple Pain Sites  No       Capillary Blood Glucose: No results found. However, due to the size of the patient record, not all encounters were searched. Please check Results Review for a complete set of results.    Social History   Tobacco Use  Smoking Status Never Smoker  Smokeless Tobacco Never Used    Goals Met:  Independence with exercise equipment Exercise tolerated well Strength training completed today  Goals Unmet:  Not Applicable  Comments: Service time is from 1305 to 1415    Dr. Wesam G. Yacoub is Medical Director for Pulmonary Rehab at [facility]. 

## 2019-09-16 ENCOUNTER — Other Ambulatory Visit: Payer: Self-pay

## 2019-09-16 ENCOUNTER — Encounter (HOSPITAL_COMMUNITY)
Admission: RE | Admit: 2019-09-16 | Discharge: 2019-09-16 | Disposition: A | Discharge status: Home / Self Care | Payer: Medicare Other | Source: Ambulatory Visit | Attending: Cardiology | Admitting: Cardiology

## 2019-09-16 DIAGNOSIS — I5032 Chronic diastolic (congestive) heart failure: Secondary | ICD-10-CM | POA: Diagnosis not present

## 2019-09-16 NOTE — Progress Notes (Signed)
Daily Session Note  Patient Details  Name: Kayla Harrison MRN: [id number] Date of Birth: Jul 26, 1946 Referring Provider:     Pulmonary Rehab Walk Test from 08/24/2019 in [place]  Referring Provider  Dr. Aundra Dubin      Encounter Date: 09/16/2019  Check In: Session Check In - 09/16/19 1029      Check-In   Supervising physician immediately available to respond to emergencies  [facility] immediately available    Physician(s)  Dr. Avon Gully    Location  [facility]    Staff Present  Rosebud Poles, RN, Bjorn Loser, MS, Exercise Physiologist;Lisa [name], RN    Virtual Visit  No    Medication changes reported      No    Fall or balance concerns reported     No    Tobacco Cessation  No Change    Warm-up and Cool-down  Performed on first and last piece of equipment    Resistance Training Performed  Yes    VAD Patient?  No    PAD/SET Patient?  No      Pain Assessment   Currently in Pain?  No/denies    Multiple Pain Sites  No       Capillary Blood Glucose: No results found. However, due to the size of the patient record, not all encounters were searched. Please check Results Review for a complete set of results.    Social History   Tobacco Use  Smoking Status Never Smoker  Smokeless Tobacco Never Used    Goals Met:  Independence with exercise equipment Exercise tolerated well Strength training completed today  Goals Unmet:  Not Applicable  Comments: Service time is from 1005 to 1110    Dr. Rush Farmer is Medical Director for Pulmonary Rehab at [facility].

## 2019-09-20 IMAGING — CT CT CHEST W/ CM
2 of 3 series · 15 of 36 positions shown, 18 images · IV contrast (iopamidol)
Comparison: CT a chest 09/15/2017; PET-CT 08/27/2017

CLINICAL DATA: Patient with history of B-cell lymphoma. Follow-up
exam. Patient with history of chemotherapy and radiation.

EXAM:
CT CHEST WITH CONTRAST
TECHNIQUE: Multidetector CT imaging of the chest was performed during
intravenous contrast administration.
CONTRAST:  75mL GSAPAB-PVV IOPAMIDOL (GSAPAB-PVV) INJECTION 61%

[Series 2: axial st · axial · 0.69mm/px · z∈[-283,-31]mm · 12 of 148 slices shown, 15 images]
[im 11/148  mediastinal]
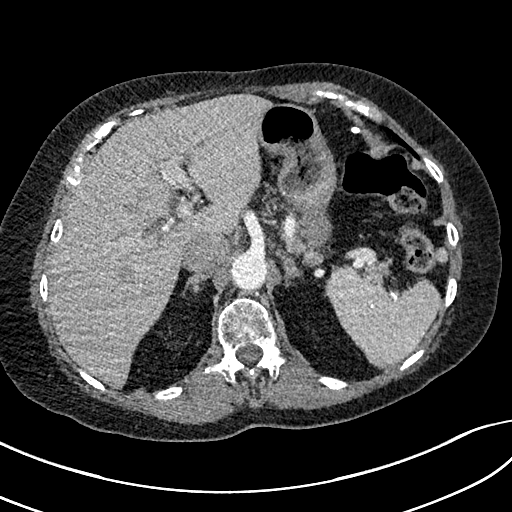
[im 11/148  lung]
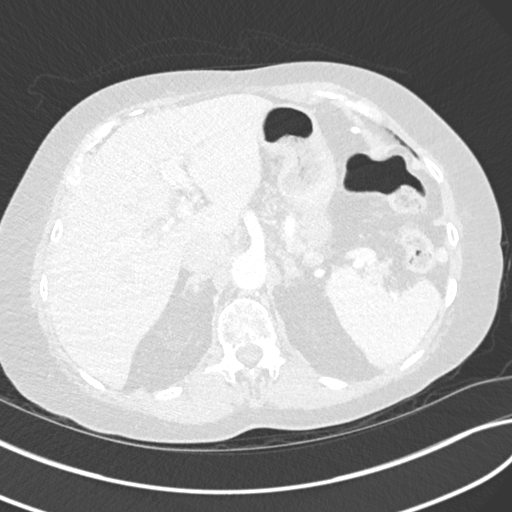
[im 22/148  lung]
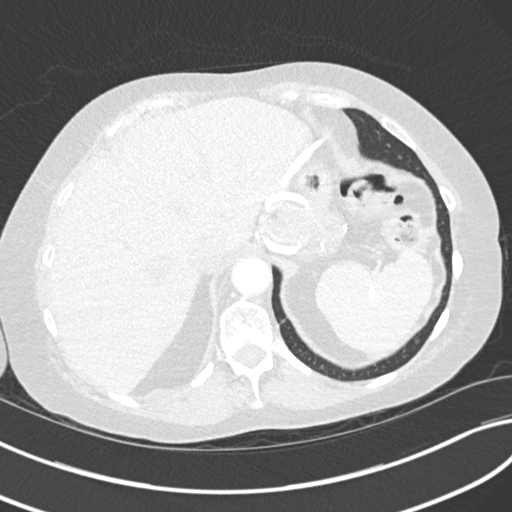
[im 33/148  lung]
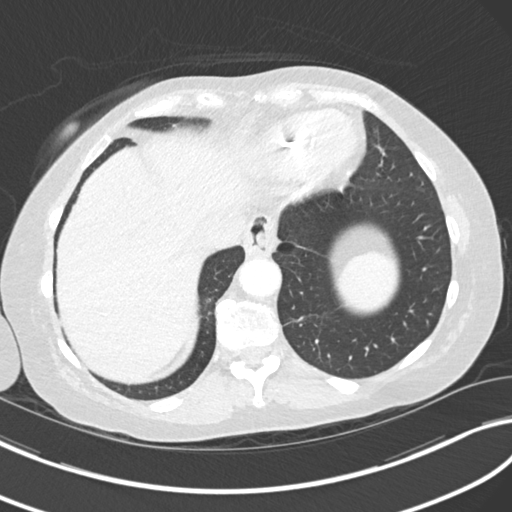
[im 44/148  lung]
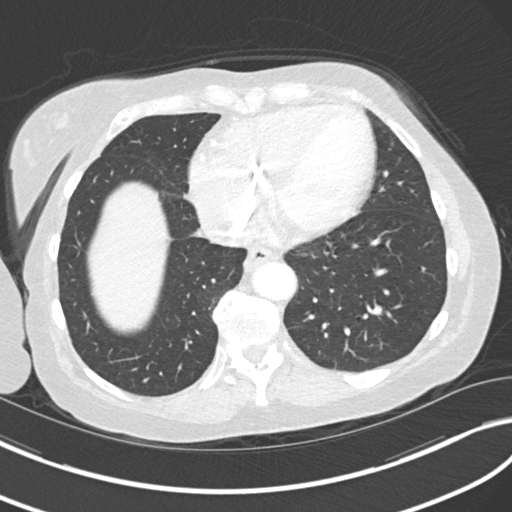
[im 55/148  mediastinal]
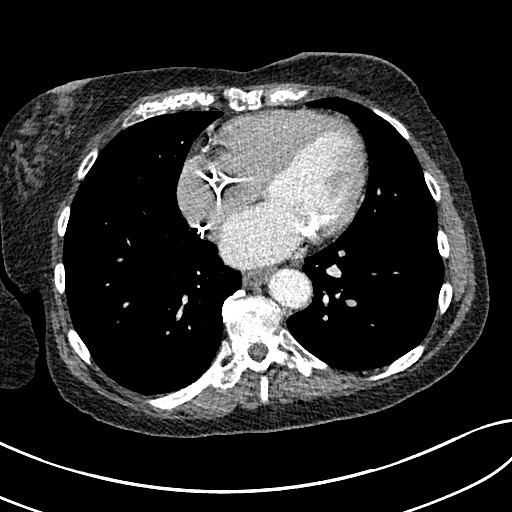
[im 55/148  lung]
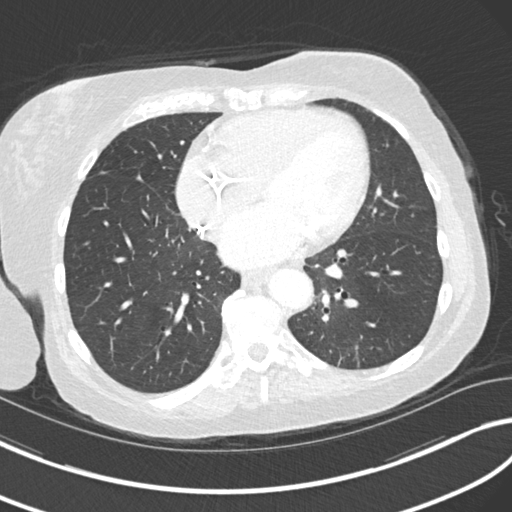
[im 66/148  lung]
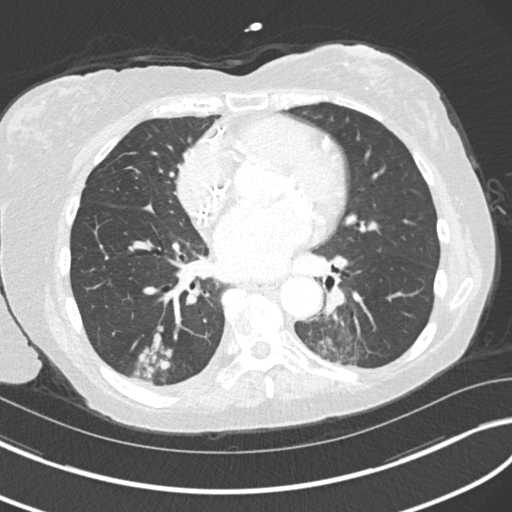
[im 82/148  lung]
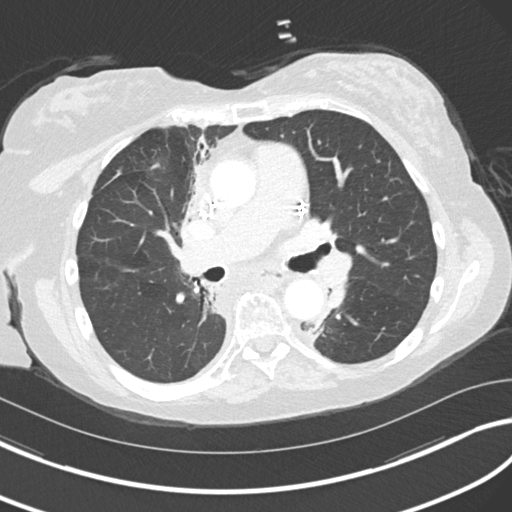
[im 93/148  lung]
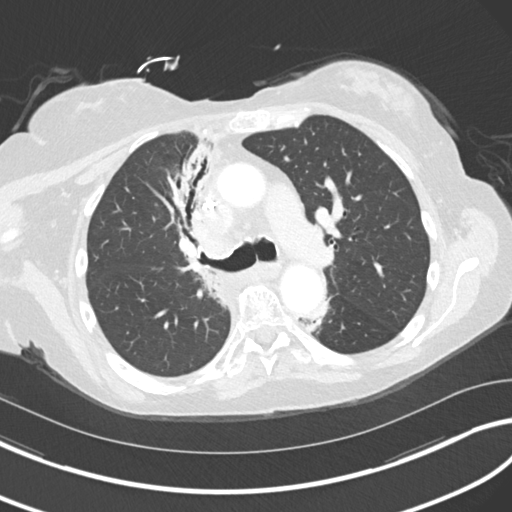
[im 104/148  mediastinal]
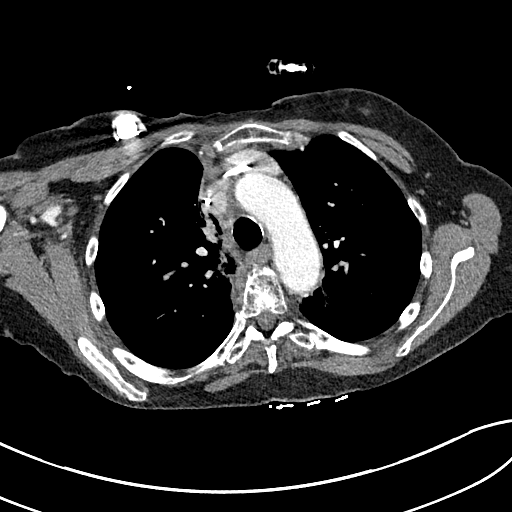
[im 104/148  lung]
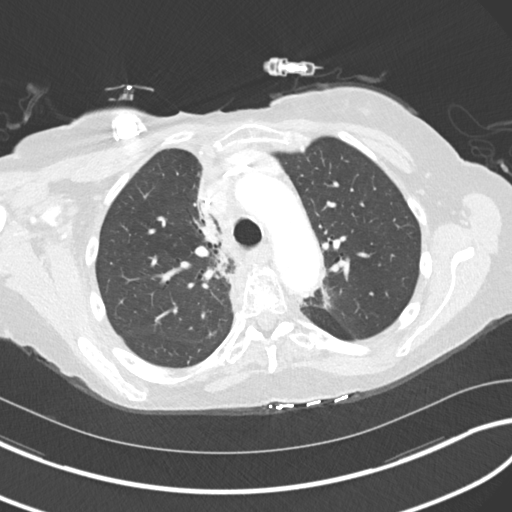
[im 115/148  lung]
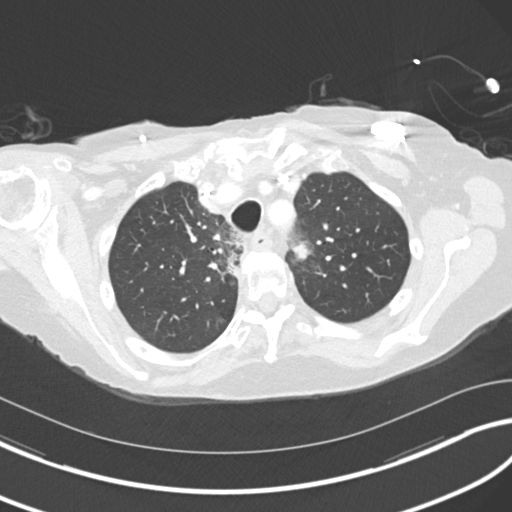
[im 126/148  lung]
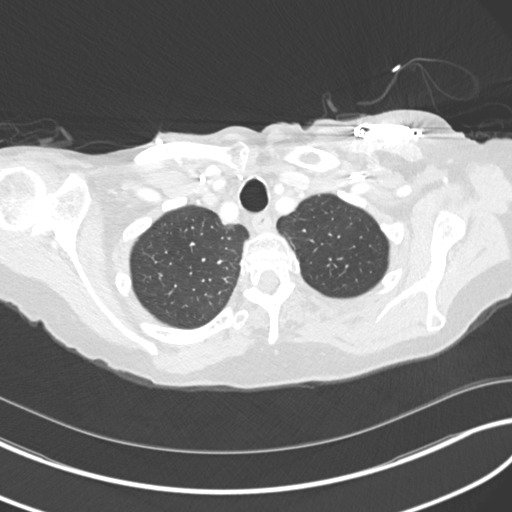
[im 137/148  lung]
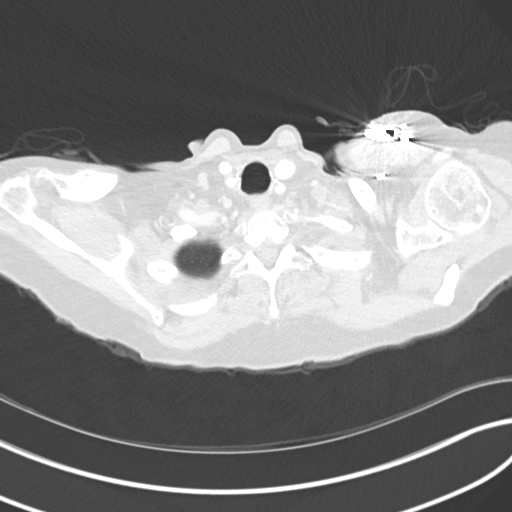

[Series 6: coronal · coronal · 0.60mm/px · 3 of 116 slices shown]
[im 24/116  lung]
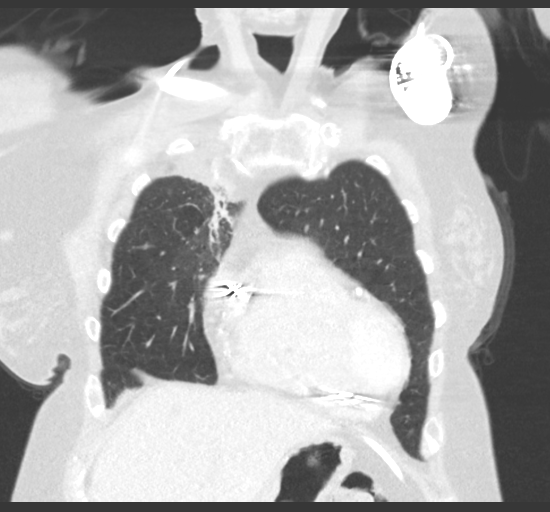
[im 47/116  lung]
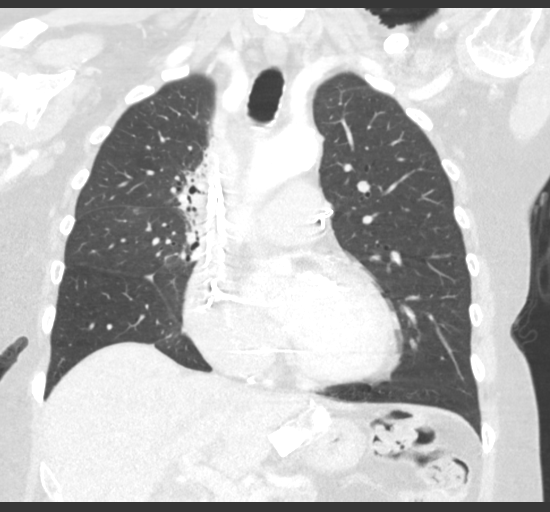
[im 70/116  lung]
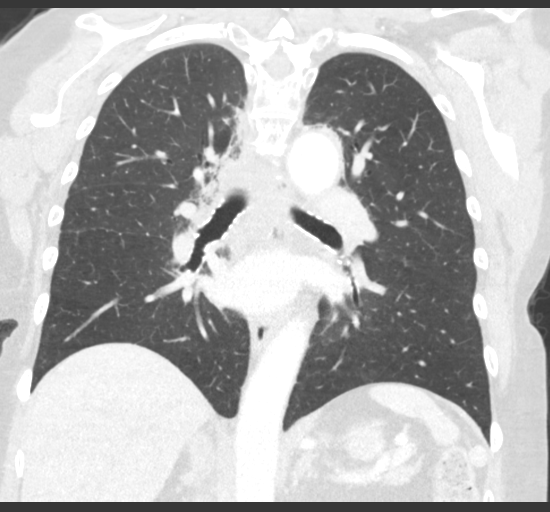

[15 of 36 positions shown; findings below may reference images not displayed]

FINDINGS: Cardiovascular: Right anterior chest wall Port-A-Cath is present
with tip terminating in the superior vena cava. Normal heart size.
Coronary arterial vascular calcifications. Mitral annular
calcifications.

Mediastinum/Nodes: No axillary lymphadenopathy. Slight interval
decrease in size of right paratracheal lymph node measuring 1.8 x
1.3 cm (image 53; series 2). No new or enlarging adenopathy.
Postsurgical changes involving the distal esophagus/GE junction.

Lungs/Pleura: Central airways are patent. Stable 6 mm left lower
lobe nodule (image 88; series 5). When compared to prior exam there
has been near complete resolution of previously described large
right and moderate left layering pleural effusions. New patchy
consolidative appearing nodularity within the right lower lobe
(image 85; series 5). Stable 3 mm right upper lobe nodule (image 25;
series 5). Suspect interval evolution of right paramediastinal post
radiation changes. Patchy ground-glass opacities with bronchiectasis
and architectural distortion within the medial left lower lobe and
left upper lobe likely post radiation changes. No pneumothorax.

Upper Abdomen: No acute process.

Musculoskeletal: Thoracic spine degenerative changes. No aggressive
or acute appearing osseous lesions.
IMPRESSION: 1. New patchy area of nodular consolidation within the right lower
lobe which may represent infectious/inflammatory process.
Possibility of lymphoma is not entirely excluded.
2. Evolving paramediastinal postradiation changes. Near complete
resolution of now trace right pleural effusion. Resolved left
pleural effusion.
3. Interval decrease in size of right paratracheal adenopathy.
4. Aortic Atherosclerosis ([id number].A).

## 2019-09-21 ENCOUNTER — Other Ambulatory Visit: Payer: Self-pay

## 2019-09-21 ENCOUNTER — Encounter (HOSPITAL_COMMUNITY)
Admission: RE | Admit: 2019-09-21 | Discharge: 2019-09-21 | Disposition: A | Discharge status: Home / Self Care | Payer: Medicare Other | Source: Ambulatory Visit | Attending: Cardiology | Admitting: Cardiology

## 2019-09-21 VITALS — Wt 228.2 lb

## 2019-09-21 DIAGNOSIS — I5032 Chronic diastolic (congestive) heart failure: Secondary | ICD-10-CM

## 2019-09-21 NOTE — Progress Notes (Signed)
Daily Session Note  Patient Details  Name: Kayla Harrison MRN: [id number] Date of Birth: 01/18/1946 Referring Provider:     Pulmonary Rehab Walk Test from 08/24/2019 in [place]  Referring Provider  Dr. Aundra Dubin      Encounter Date: 09/21/2019  Check In: Session Check In - 09/21/19 1325      Check-In   Supervising physician immediately available to respond to emergencies  [facility] immediately available    Physician(s)  Dr. [place] Crane    Location  [facility]    Staff Present  Rosebud Poles, RN, Bjorn Loser, MS, Exercise Physiologist;Elric Tirado [name], RN    Virtual Visit  No    Medication changes reported      No    Fall or balance concerns reported     No    Tobacco Cessation  No Change    Warm-up and Cool-down  Performed on first and last piece of equipment    Resistance Training Performed  Yes    VAD Patient?  No    PAD/SET Patient?  No      Pain Assessment   Currently in Pain?  No/denies    Multiple Pain Sites  No       Capillary Blood Glucose: No results found. However, due to the size of the patient record, not all encounters were searched. Please check Results Review for a complete set of results.  Exercise Prescription Changes - 09/21/19 1500      Response to Exercise   Blood Pressure (Admit)  106/70    Blood Pressure (Exercise)  120/62    Blood Pressure (Exit)  104/66    Heart Rate (Admit)  77 bpm    Heart Rate (Exercise)  83 bpm    Heart Rate (Exit)  75 bpm    Oxygen Saturation (Admit)  98 %    Oxygen Saturation (Exercise)  94 %    Oxygen Saturation (Exit)  98 %    Rating of Perceived Exertion (Exercise)  13    Perceived Dyspnea (Exercise)  2    Duration  Continue with 30 min of aerobic exercise without signs/symptoms of physical distress.    Intensity  THRR unchanged      Progression   Progression  Continue to progress workloads to maintain intensity without signs/symptoms of physical distress.       Resistance Training   Training Prescription  Yes    Weight  orange bands    Reps  10-15    Time  10 Minutes      Interval Training   Interval Training  No      Recumbant Bike   Level  1    Watts  10    Minutes  15      NuStep   Level  3    SPM  80    Minutes  15    METs  1.7       Social History   Tobacco Use  Smoking Status Never Smoker  Smokeless Tobacco Never Used    Goals Met:  Exercise tolerated well No report of cardiac concerns or symptoms Strength training completed today  Goals Unmet:  Not Applicable  Comments: Service time is from 1300 to 1407    Dr. Rush Farmer is Medical Director for Pulmonary Rehab at [facility].

## 2019-09-23 ENCOUNTER — Other Ambulatory Visit: Payer: Self-pay

## 2019-09-23 ENCOUNTER — Encounter (HOSPITAL_COMMUNITY)
Admission: RE | Admit: 2019-09-23 | Discharge: 2019-09-23 | Disposition: A | Discharge status: Home / Self Care | Payer: Medicare Other | Source: Ambulatory Visit | Attending: Cardiology | Admitting: Cardiology

## 2019-09-23 DIAGNOSIS — I5032 Chronic diastolic (congestive) heart failure: Secondary | ICD-10-CM

## 2019-09-23 NOTE — Progress Notes (Signed)
Daily Session Note  Patient Details  Name: Kayla Harrison MRN: [id number] Date of Birth: Jul 24, 1946 Referring Provider:     Pulmonary Rehab Walk Test from 08/24/2019 in [place]  Referring Provider  Dr. Aundra Dubin      Encounter Date: 09/23/2019  Check In: Session Check In - 09/23/19 1308      Check-In   Supervising physician immediately available to respond to emergencies  [facility] immediately available    Physician(s)  Dr. [place] Crane    Location  [facility]    Staff Present  Rosebud Poles, RN, Bjorn Loser, MS, Exercise Physiologist;[name] [name], RN    Virtual Visit  No    Medication changes reported      No    Fall or balance concerns reported     No    Tobacco Cessation  No Change    Warm-up and Cool-down  Performed as group-led instruction    Resistance Training Performed  Yes    VAD Patient?  No    PAD/SET Patient?  No      Pain Assessment   Currently in Pain?  No/denies    Multiple Pain Sites  No       Capillary Blood Glucose: No results found. However, due to the size of the patient record, not all encounters were searched. Please check Results Review for a complete set of results.    Social History   Tobacco Use  Smoking Status Never Smoker  Smokeless Tobacco Never Used    Goals Met:  Exercise tolerated well No report of cardiac concerns or symptoms Strength training completed today  Goals Unmet:  Not Applicable  Comments: Service time is from 1310 to 1410    Dr. Rush Farmer is Medical Director for Pulmonary Rehab at [facility].

## 2019-09-27 IMAGING — DX DG CHEST 2V
2 series · 2 of 2 positions shown · non-contrast
Comparison: 10/06/2017

Correlation: Interval CT chest 02/17/2018

CLINICAL DATA: One year, atrial fibrillation, history lung cancer,
non-Hodgkin's lymphoma

EXAM:
CHEST - 2 VIEW

[chest pa]
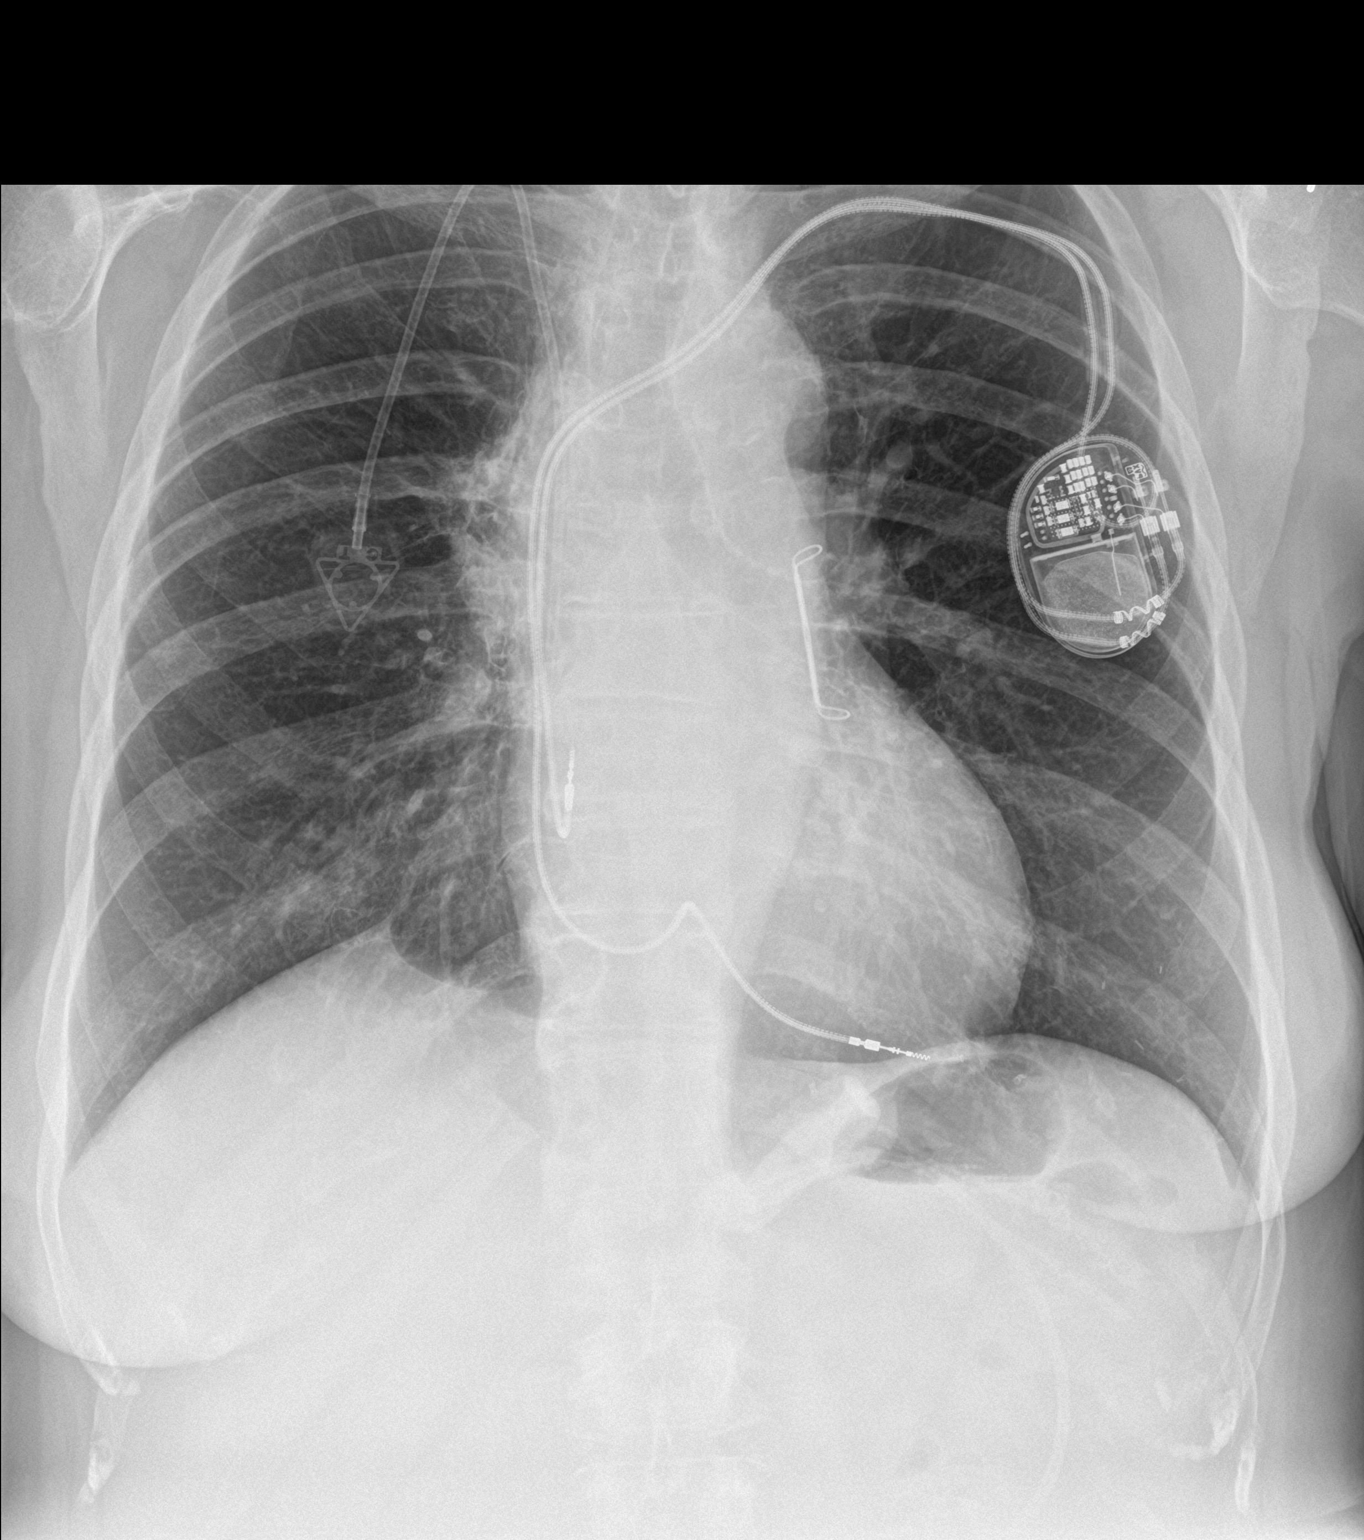

[chest lat]
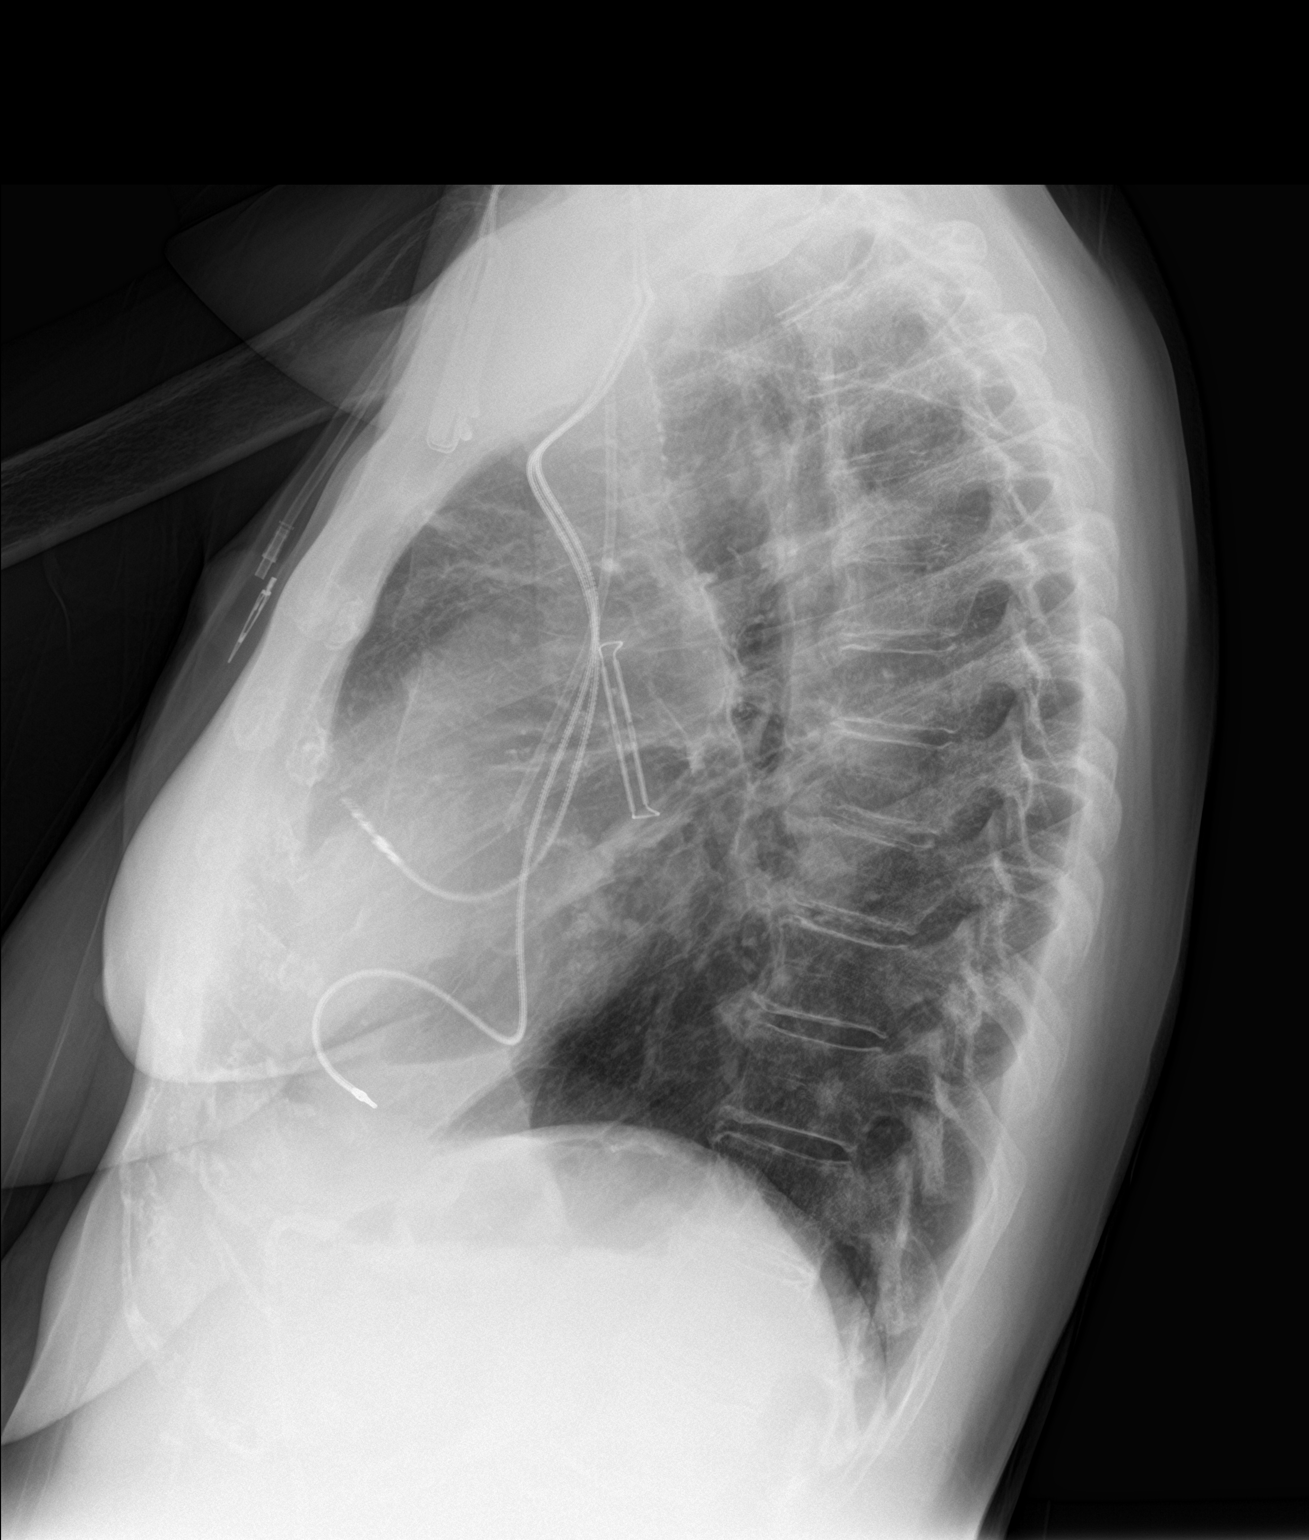

[2 of 2 positions shown; findings below may reference images not displayed]

FINDINGS: RIGHT jugular Port-A-Cath with tip projecting over SVC.

LEFT subclavian sequential pacemaker with leads projecting at RIGHT
atrium and RIGHT ventricle.

Prior LEFT atrial appendage clipping.

Normal heart size, mediastinal contours, and pulmonary vascularity.

Emphysematous and bronchitic changes.

Radiation fibrosis changes in the anteromedial RIGHT upper lobe
again identified.

No acute infiltrate, pleural effusion or pneumothorax.

Bones unremarkable.

Surgical clips and the inferior cervical region bilaterally, by
history thyroidectomy 3557.
IMPRESSION: Emphysematous changes with post radiation fibrosis changes at medial
RIGHT upper lobe.

No acute abnormalities.

## 2019-09-28 ENCOUNTER — Encounter (HOSPITAL_COMMUNITY): Payer: Medicare Other

## 2019-09-30 ENCOUNTER — Encounter (HOSPITAL_COMMUNITY): Payer: Medicare Other

## 2019-10-02 ENCOUNTER — Ambulatory Visit: Payer: Medicare Other | Attending: Internal Medicine

## 2019-10-02 DIAGNOSIS — Z23 Encounter for immunization: Secondary | ICD-10-CM | POA: Diagnosis not present

## 2019-10-02 NOTE — Progress Notes (Signed)
   Covid-19 Vaccination Clinic  Name:  CAMARI QUINTANILLA    MRN: [id number] DOB: 1946-09-05  10/02/2019  Ms. Kovar was observed post Covid-19 immunization for 30 minutes based on pre-vaccination screening without incidence. She was provided with Vaccine Information Sheet and instruction to access the V-Safe system.   Ms. Borba was instructed to call 911 with any severe reactions post vaccine: Marland Kitchen Difficulty breathing  . Swelling of your face and throat  . A fast heartbeat  . A bad rash all over your body  . Dizziness and weakness    Immunizations Administered    Name Date Dose VIS Date Route   Pfizer COVID-19 Vaccine 10/02/2019  1:36 PM 0.3 mL 09/03/2019 Intramuscular   Manufacturer: [employer]   Lot: [id number]   [place]: [id number]

## 2019-10-04 ENCOUNTER — Ambulatory Visit (INDEPENDENT_AMBULATORY_CARE_PROVIDER_SITE_OTHER): Payer: Medicare Other | Admitting: Internal Medicine

## 2019-10-04 ENCOUNTER — Encounter: Payer: Self-pay | Admitting: Pulmonary Disease

## 2019-10-04 ENCOUNTER — Encounter: Payer: Self-pay | Admitting: Internal Medicine

## 2019-10-04 ENCOUNTER — Other Ambulatory Visit: Payer: Self-pay

## 2019-10-04 ENCOUNTER — Ambulatory Visit (INDEPENDENT_AMBULATORY_CARE_PROVIDER_SITE_OTHER): Payer: Medicare Other | Admitting: Pulmonary Disease

## 2019-10-04 VITALS — BP 114/68 | HR 69 | Ht 66.0 in | Wt 227.0 lb

## 2019-10-04 VITALS — BP 130/68 | HR 74 | Temp 97.1°F | Ht 66.0 in | Wt 227.8 lb

## 2019-10-04 DIAGNOSIS — I4892 Unspecified atrial flutter: Secondary | ICD-10-CM | POA: Diagnosis not present

## 2019-10-04 DIAGNOSIS — I480 Paroxysmal atrial fibrillation: Secondary | ICD-10-CM

## 2019-10-04 DIAGNOSIS — G4733 Obstructive sleep apnea (adult) (pediatric): Secondary | ICD-10-CM | POA: Diagnosis not present

## 2019-10-04 NOTE — Patient Instructions (Signed)
Medication Instructions:  Your physician recommends that you continue on your current medications as directed. Please refer to the Current Medication list given to you today.  Labwork: None ordered.  Testing/Procedures: None ordered.  Follow-Up: Your physician wants you to follow-up in: 6 months with Dr Caryl Comes. You will receive a reminder letter in the mail two months in advance. If you don't receive a letter, please call our office to schedule the follow-up appointment.  Remote monitoring is used to monitor your Pacemaker of ICD from home. This monitoring reduces the number of office visits required to check your device to one time per year. It allows [place] to keep an eye on the functioning of your device to ensure it is working properly.Any Other Special Instructions Will Be Listed Below (If Applicable).  If you need a refill on your cardiac medications before your next appointment, please call your pharmacy.

## 2019-10-04 NOTE — Patient Instructions (Signed)
Will arrange for new CPAP set up Follow up in 2 months 

## 2019-10-04 NOTE — Progress Notes (Signed)
Patient Care Team: [name], MD as PCP - General (Endocrinology) Deboraha Sprang, MD as PCP - Cardiology (Cardiology)   HPI  Kayla Harrison is a 74 y.o. female Seen in followup for a pacemaker implanted at [facility] and atrial fibrillation.  She underwent pulmonary vein isolation at [facility] and had recurrent atrial fibrillation and underwent convergent ablation at [facility] 1/74; it was complicated by heart block prompting pacemaker implantation.  She has had recurrent aFib and  was restarted on amio--and then stopped 2/2 worsening SOB  She has a history of prior strokes and remains on oral anticoagulation . Hospitalized 12/18 for wide-complex tachycardia which was thought to be related to intermittent pacing because of atrial flutter with blanking Reprogrammed DDIR.  Amiodarone resumed     Symptomatic orthostasis; tried an abdominal binder without improvement.  When seen by Dr. Wallis Bamberg 12/20, "no recent significant lightheadedness "  5/18 presented w SOB and facial fullness, found to have SVC syndrome 2/2 NHlymphoma Rx initially with XRT  Now in remission     Date Cr K TSH ALT Hgb  8/18   0.2 15 11.9  9/18                       9.3  3/19   0.722 (12/18) 23 11.2  4/20    18 13.2  11/20 1.22 4.5 0.74 (7/20) 17(7/20) 11.9     StillSOB  Has seen Dr Halford Chessman >Echo>> pulm HTN   I reviewed with DMcL re [place] who concurred with Dr Collie Siad concern and communicating with the latter today favored Braxton over CPX  Raising questions of amiodarone lung toxicity; DLCO was decreased but CT scan was not suggestive.   She is on apixoban without bleeding   With her orthostasis, she had noted that the showers were her worst situation.  She started taking cooler shorter showers and her symptoms have attenuated significantly.  No interval falls.  Still quite short of breath with only modest exertion at 4-5 minutes.    DATE TEST EF   2011 Echo  55 % LAE 40 mm  7/14 Echo  55-60 %   9/18 Echo  55-60%    12/18 Echo  55-60% Severe LAE ?  11/20 TEE 55%  moderate MR/TR mild MS      Remote morbid obesity status>>  post lap band surgery    Thromboembolic risk factors ( age -19 , TIA/CVA-2, Gender-1) for a CHADSVASc Score of 5   Past Medical History:  Diagnosis Date  . Anemia   . Arthritis    osteoarthritis - knees and right shoulder  . Atrial fibrillation [facility])    ablation- 2x's-- 1st time- [facility], 2nd event at [facility] in 2008. Convergent ablation at [facility] 6/14  . Atrial fibrillation ([place])   . Blood transfusion without reported diagnosis   . Breast cancer ([place])    Dr Margot Chimes, total thyroidectomy- 1999- for cancer  . 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 Chronic bilateral pleural effusions   . Colon polyp    Dr Earlean Shawl  . Complete heart block ([place])   . Complication of anesthesia    Ketamine produces LSD reaction, bright colored nightmarish experience   . Dyslipidemia   . Dyspnea   . Endometriosis   . Fibroids   . H/O pleural effusion    s/p thoracentesis w 3243m withdrawn  . Hepatitis    [place] ?hepatitis   .  History of dysphagia    due to radiation therapy  . History of hiatal hernia    small noted on PET scan  . History of kidney stones   . Hx of thyroid cancer    Dr Forde Dandy  . Hyperlipidemia   . Hypertension   . Hypothyroidism   . Lung cancer, lower lobe ([place]) 01/2017   radiation RX completed 03/04/17; will start chemo 6/27, pt unaware of lung cancer  . Morbid obesity ([place])    Status post lap band surgery  . Nephrolithiasis   . Non Hodgkin's lymphoma ([place])    on chemotherapy  . Persistent atrial fibrillation ([place])    a. s/p PVI 2008 b. s/p convergent ablation 2334 complicated by bradycardia requiring pacemaker implant  . Personal history of radiation therapy   . Presence of permanent cardiac pacemaker   . Rotator cuff tear    Right  . Sinus node dysfunction ([place])    Complicating convergent ablation 6/14  . Stroke [facility])    2003- [place] x2    . SVC syndrome    with lung mass and non hodgkins lymphoma  . Thyroid cancer ([place]) 2000    Past Surgical History:  Procedure Laterality Date  . ABDOMINAL HYSTERECTOMY  1983  . afib ablation     a. 2008 PVI b. 2014 convergent ablation  . APPENDECTOMY    . BONE MARROW BIOPSY  02/21/2017  . BREAST LUMPECTOMY Left 2010  . bso  1998  . CARDIAC CATHETERIZATION     2015- negative  . CARDIOVERSION  10/09/2012   Procedure: CARDIOVERSION;  Surgeon: Minus Breeding, MD;  Location: [place];  Service: Cardiovascular;  Laterality: N/A;  . CARDIOVERSION  10/09/2012   Procedure: CARDIOVERSION;  Surgeon: Minus Breeding, MD;  Location: [facility] ENDOSCOPY;  Service: Cardiovascular;  Laterality: N/A;  [name] gave the ok to add pt to the add on , but we must check to find out if the can add pt on at [phone] ( [phone])  . CARDIOVERSION N/A 11/20/2012   Procedure: CARDIOVERSION;  Surgeon: Fay Records, MD;  Location: [facility] ENDOSCOPY;  Service: Cardiovascular;  Laterality: N/A;  . CARDIOVERSION N/A 07/18/2017   Procedure: CARDIOVERSION;  Surgeon: Thayer Headings, MD;  Location: [facility] ENDOSCOPY;  Service: Cardiovascular;  Laterality: N/A;  . CARDIOVERSION N/A 10/03/2017   Procedure: CARDIOVERSION;  Surgeon: Sanda , MD;  Location: [facility] ENDOSCOPY;  Service: Cardiovascular;  Laterality: N/A;  . CARDIOVERSION N/A 01/07/2018   Procedure: CARDIOVERSION;  Surgeon: Acie Fredrickson Wonda Cheng, MD;  Location: [place];  Service: Cardiovascular;  Laterality: N/A;  . CHOLECYSTECTOMY    . COLONOSCOPY W/ POLYPECTOMY     Dr Earlean Shawl  . CYSTOSCOPY N/A 02/06/2015   Procedure: CYSTOSCOPY;  Surgeon: Kathie Rhodes, MD;  Location: [facility];  Service: Urology;  Laterality: N/A;  . CYSTOSCOPY W/ RETROGRADES Left 11/17/2017   Procedure: CYSTOSCOPY WITH RETROGRADE /PYELOGRAM/;  Surgeon: Kathie Rhodes, MD;  Location: [facility];  Service: Urology;  Laterality: Left;  . CYSTOSCOPY WITH RETROGRADE PYELOGRAM, URETEROSCOPY AND STENT PLACEMENT Right 02/06/2015    Procedure: RETROGRADE PYELOGRAM, RIGHT URETEROSCOPY STENT PLACEMENT;  Surgeon: Kathie Rhodes, MD;  Location: [facility];  Service: Urology;  Laterality: Right;  . CYSTOSCOPY WITH RETROGRADE PYELOGRAM, URETEROSCOPY AND STENT PLACEMENT Right 03/07/2017   Procedure: CYSTOSCOPY WITH RIGHT RETROGRADE PYELOGRAM,RIGHT  URETEROSCOPYLASER LITHOTRIPSY  AND STENT PLACEMENT AND STONE BASKETRY;  Surgeon: Kathie Rhodes, MD;  Location: [place];  Service: Urology;  Laterality: Right;  . EYE SURGERY  cataract surgery  . fatty mass removal  1999   pubic area  . HOLMIUM LASER APPLICATION N/A 3/61/4431   Procedure: HOLMIUM LASER APPLICATION;  Surgeon: Kathie Rhodes, MD;  Location[facility];  Service: Urology;  Laterality: N/A;  . HOLMIUM LASER APPLICATION Right 5/40/0867   Procedure: HOLMIUM LASER APPLICATION;  Surgeon: Kathie Rhodes, MD;  Location[facility];  Service: Urology;  Laterality: Right;  . HOLMIUM LASER APPLICATION Left 03/11/5092   Procedure: HOLMIUM LASER APPLICATION;  Surgeon: Kathie Rhodes, MD;  Location: [facility];  Service: Urology;  Laterality: Left;  . IR FLUORO GUIDE PORT INSERTION RIGHT  02/24/2017  . IR NEPHROSTOMY PLACEMENT RIGHT  11/17/2017  . IR PATIENT EVAL TECH 0-60 MINS  03/11/2017  . IR REMOVAL TUN ACCESS W/ PORT W/O FL MOD SED  04/20/2018  . IR US GUIDE VASC ACCESS RIGHT  02/24/2017  . KNEE ARTHROSCOPY     bilateral  . LAPAROSCOPIC GASTRIC BANDING  07/10/2010  . LAPAROSCOPIC GASTRIC BANDING     Laparoscopic adjustable banding APS System with posterior hiatal hernia, 2 suture.  Marland Kitchen LAPAROTOMY     for ruptured ovary and ovarian artery   . NEPHROLITHOTOMY Right 11/17/2017   Procedure: NEPHROLITHOTOMY PERCUTANEOUS;  Surgeon: Kathie Rhodes, MD;  [facility];  Service: Urology;  Laterality: Right;  . PACEMAKER INSERTION  03/10/2013   MDT dual chamber PPM  . POCKET REVISION N/A 12/08/2013   Procedure: POCKET REVISION;  Surgeon: Deboraha Sprang, MD;  Location: [facility]  CATH LAB;  Service: Cardiovascular;  Laterality: N/A;  . PORTA CATH INSERTION    . REVERSE SHOULDER ARTHROPLASTY Right 05/14/2018   Procedure: RIGHT REVERSE SHOULDER ARTHROPLASTY;  Surgeon: Tania Ade, MD;  Location: [place];  Service: Orthopedics;  Laterality: Right;  . REVERSE SHOULDER REPLACEMENT Right 05/14/2018  . RIGHT HEART CATH N/A 07/21/2019   Procedure: RIGHT HEART CATH;  Surgeon: Larey Dresser, MD;  Location: [place] CV LAB;  Service: Cardiovascular;  Laterality: N/A;  . TEE WITH CARDIOVERSION  09/22/2017  . TEE WITHOUT CARDIOVERSION N/A 10/03/2017   Procedure: TRANSESOPHAGEAL ECHOCARDIOGRAM (TEE);  Surgeon: Sanda Meagen Limones, MD;  Location: [facility] ENDOSCOPY;  Service: Cardiovascular;  Laterality: N/A;  . TEE WITHOUT CARDIOVERSION N/A 08/04/2019   Procedure: TRANSESOPHAGEAL ECHOCARDIOGRAM (TEE);  Surgeon: Larey Dresser, MD;  Location: [facility] ENDOSCOPY;  Service: Cardiovascular;  Laterality: N/A;  . THYROIDECTOMY  1998   Dr Margot Chimes  . TONSILLECTOMY    . TOTAL KNEE ARTHROPLASTY  04/13/2012   Procedure: TOTAL KNEE ARTHROPLASTY;  Surgeon: Rudean Haskell, MD;  Location: [place];  Service: Orthopedics;  Laterality: Right;  Marland Kitchen VIDEO BRONCHOSCOPY WITH ENDOBRONCHIAL ULTRASOUND N/A 02/07/2017   Procedure: VIDEO BRONCHOSCOPY WITH ENDOBRONCHIAL ULTRASOUND;  Surgeon: Marshell Garfinkel, MD;  Location: [place];  Service: Pulmonary;  Laterality: N/A;    Current Outpatient Medications  Medication Sig Dispense Refill  . acetaminophen (TYLENOL) 500 MG tablet Take 1,000 mg by mouth every 6 (six) hours as needed for moderate pain or headache.    Marland Kitchen amiodarone (PACERONE) 200 MG tablet Take 1 tablet (200 mg total) by mouth daily. 90 tablet 3  . apixaban (ELIQUIS) 5 MG TABS tablet Take 5 mg by mouth 2 (two) times daily.    Marland Kitchen levothyroxine (SYNTHROID) 137 MCG tablet Take 137 mcg by mouth See admin instructions. TAKE 1 TABLET (137 MCG) BY MOUTH DAILY, EXCEPT SATURDAYS    . Lifitegrast (XIIDRA) 5 % SOLN Apply 1  drop to eye at bedtime.     Marland Kitchen  Prenatal MV & Min w/FA-DHA (PRENATAL ADULT GUMMY/DHA/FA PO) Take 1 capsule by mouth 2 (two) times daily.    . rosuvastatin (CRESTOR) 10 MG tablet Take 1 tablet by mouth 2 (two) times a week. Takes on Monday and Thursday     Current Facility-Administered Medications  Medication Dose Route Frequency Provider Last Rate Last Admin  . sodium chloride flush (NS) 0.9 % injection 3 mL  3 mL Intravenous Q12H Deboraha Sprang, MD        Allergies  Allergen Reactions  . Dofetilide Other (See Comments)    Prolonged QT interval   . Rivaroxaban Other (See Comments)    Nose Bleed X 6 hrs ; packing in ER Nasal hemorrhage  . Epinephrine Other (See Comments)    Oral anesthetic Dental form only (liquid). Patient stated she will become "out of it" she can hear you but cannot respond in normal fashion. "not fully with it"  . Adhesive [Tape] Rash    Paper tape as well  . Ketamine Other (See Comments)    HALLUCINATIONS    Review of Systems negative except from HPI and PMH  Physical Exam BP 114/68   Pulse 69   Ht _0  (1.676 m)   Wt 227 lb (103 kg)   SpO2 99%   BMI 36.64 kg/m   Well developed and well nourished in no acute distress HENT normal Neck supple with JVP-flat Clear Device pocket well healed; without hematoma or erythema.  There is no tethering  Regular rate and rhythm, no  murmur Abd-soft with active BS No Clubbing cyanosis edema Skin-warm and dry A & Oriented  Grossly normal sensory and motor function       Assessment and  Plan  Atrial fibrillation/flutter  S/p convergent ablation persistent  Prior Strokes  Pacemaker Medtronic MRI compatible   Non-Hodgkin's lymphoma on chemotherapy  Dyspnea on exertion     Treated hypothyroidism  High Risk Medication Surveillance-amiodarone  Junctional rhythm  Symptomatic palpitations question mechanism  Orthostatic hypotension     Much improved by taking cooler shorter showers.  Has also  improved in conjunction with the cooler weather.  No bleeding.  Chronotropic incompetence was addressed by activating rate response.  ADL was set at 90 so as not to be too high.  Tolerating amiodarone.  Surveillance laboratories were within appropriate limits.  Repeat TSH ordered  Encouraged exercise  We spent more than 50% of our >30 min visit in face to face counseling regarding the above

## 2019-10-04 NOTE — Progress Notes (Signed)
[facility]  Chief Complaint  Patient presents with   Follow-up    review sleep study    Constitutional:  BP 130/68 (BP Location: Right Arm, Cuff Size: Normal)    Pulse 74    Temp (!) 97.1 F (36.2 C) (Temporal)    Ht '5\' 6"'$  (1.676 m)    Wt 227 lb 12.8 oz (103.3 kg)    SpO2 97% Comment: RA   BMI 36.77 kg/m   Past Medical History:  Thyroid cancer, NHL, Sinus node dysfunction s/p ablation and PM, CVA, A fib, Nephrolithiasis, HTN, Hiatal hernia, Brucellosis, Endometriosis, HLD, Colon polyp, OA, Breast cancer  Brief Summary:  Kayla Harrison is a 74 y.o. female with dyspnea, dry cough.  She has hx of non Hodgkin's lymphoma s/p R-CHOP from 04/02/17 to 07/16/17.  She had home sleep study in December.  Moderate OSA.  Feeling more fatigued.  Has noticed getting winded more easily.  Respiratory Exam:    ENMT - no sinus tenderness, no nasal discharge, no oral exudate  Respiratory - normal appearance of chest wall, normal respiratory effort w/o accessory muscle use, no dullness on percussion, no wheezing or rales  CV - s1s2 regular rate and rhythm, 2/6 murmurs, no peripheral edema, radial pulses symmetri   Ext - no cyanosis, clubbing, or joint inflammation noted t    Assessment/Plan:   Obstructive sleep apnea. - reviewed her sleep study results - discussed treatment options - reviewed how untreated sleep apnea can impact her health - will proceed with auto CPAP set up  Dyspnea on exertion. - likely from diastolic dysfunction and valvular heart disease with pulmonary hypertension, and deconditioning - encouraged monitored exercise regimen  Pulmonary hypertension. - in setting of diastolic heart failure. mitral valve disease, and OSA - f/u with Dr. Caryl Comes and Dr. Aundra Dubin with cardiology  Right lower lung nodular cluster. - stable on most recent imaging studies - she will have f/u imaging with oncology   Patient Instructions  Will  arrange for new CPAP set up  Follow up in 2 months   A total of  38 minutes were spent face to face and non-face to face with the patient and more than half of that time involved counseling or coordination of care.   Chesley Mires, MD [place] [facility] Pager: [phone] 10/04/2019, 10:38 AM  Flow Sheet     Pulmonary tests:  PFT 01/20/13 >> FEV1 2.97 (118%), FEV1% 86, TLC 5.91 (110%), DLCO 82% PFT 05/12/19 >> FEV1 2.68 (114%), FEV1% 86, TLC 4.55 (85%), DLCO 61%  Chest imaging:  CT angio chest 09/15/17 >> b/l pleural effusions, radiation fibrosis RUL CT chest 528/19 >>  stable 6 mm LLL nodule, patchy nodular consolidation RLL, stable 3 mm RUL nodule, stable Rt paramediastinal post radiation changes. CT chest 05/29/18 >> XRT changes paramediastium, decreased size of nodular cluster RLL, tiny Rt effusion CT chest 04/02/19 >> mild CM, atherosclerosis, s/p thyroidectomy, paramediastinal XRT changes b/l Rt > Lt, nodular cluster RLL decreased in size, small Rt effusion CT chest 08/13/19 >> stable cluster RLL, decreased Rt effusion  Sleep testing:  HST 09/09/19 >> AHI 18.5, SpO2 low 76%  Cardiac tests:  Echo 06/16/19 >> EF 50 to 55%, mod RV enlargement, mild MR, mild MS, mod TR, mod elevation in PASP, RVSP 45.5 mmHg RHC 07/21/19 >> PA mean 6, RV 57/7, PA 58/23 mean 40, PCWP 23, prominent V wave, CI 2.51, PVR 3.25 WU  Medications:   Allergies as of 10/04/2019  Reactions   Dofetilide Other (See Comments)   Prolonged QT interval   Rivaroxaban Other (See Comments)   Nose Bleed X 6 hrs ; packing in ER Nasal hemorrhage   Epinephrine Other (See Comments)   Oral anesthetic Dental form only (liquid). Patient stated she will become "out of it" she can hear you but cannot respond in normal fashion. "not fully with it"   Adhesive [tape] Rash   Paper tape as well   Ketamine Other (See Comments)   HALLUCINATIONS      Medication List       Accurate as of October 04, 2019  10:38 AM. If you have any questions, ask your nurse or doctor.        acetaminophen 500 MG tablet Commonly known as: TYLENOL Take 1,000 mg by mouth every 6 (six) hours as needed for moderate pain or headache.   amiodarone 200 MG tablet Commonly known as: PACERONE Take 1 tablet (200 mg total) by mouth daily.   Eliquis 5 MG Tabs tablet Generic drug: apixaban Take 5 mg by mouth 2 (two) times daily.   levothyroxine 137 MCG tablet Commonly known as: SYNTHROID Take 137 mcg by mouth See admin instructions. TAKE 1 TABLET (137 MCG) BY MOUTH DAILY, EXCEPT SATURDAYS   PRENATAL ADULT GUMMY/DHA/FA PO Take 1 capsule by mouth 2 (two) times daily.   rosuvastatin 10 MG tablet Commonly known as: CRESTOR Take 1 tablet by mouth 2 (two) times a week. Takes on Monday and Thursday   Xiidra 5 % Soln Generic drug: Lifitegrast Apply 1 drop to eye at bedtime.       Past Surgical History:  She  has a past surgical history that includes Thyroidectomy (1998); bso (1998); Cholecystectomy; Tonsillectomy; Colonoscopy w/ polypectomy; afib ablation; Abdominal hysterectomy (1983); Breast lumpectomy (Left, 2010); Laparoscopic gastric banding (07/10/2010); Appendectomy; Knee arthroscopy; laparotomy; Total knee arthroplasty (04/13/2012); Cardioversion (10/09/2012); Cardioversion (10/09/2012); Cardioversion (N/A, 11/20/2012); Pacemaker insertion (03/10/2013); pocket revision (N/A, 12/08/2013); Eye surgery; Cystoscopy with retrograde pyelogram, ureteroscopy and stent placement (Right, 02/06/2015); Holmium laser application (N/A, 7/53/0104); Cystoscopy (N/A, 02/06/2015); Video bronchoscopy with endobronchial ultrasound (N/A, 02/07/2017); IR FLUORO GUIDE PORT INSERTION RIGHT (02/24/2017); IR US Guide Vasc Access Right (02/24/2017); Bone marrow biopsy (02/21/2017); Cystoscopy with retrograde pyelogram, ureteroscopy and stent placement (Right, 03/07/2017); Holmium laser application (Right, 0/45/9136); IR PATIENT EVAL TECH 0-60 MINS  (03/11/2017); Cardioversion (N/A, 07/18/2017); TEE with cardioversion (09/22/2017); PORTA CATH INSERTION; TEE without cardioversion (N/A, 10/03/2017); Cardioversion (N/A, 10/03/2017); IR NEPHROSTOMY PLACEMENT RIGHT (11/17/2017); Nephrolithotomy (Right, 11/17/2017); Cystoscopy w/ retrogrades (Left, 11/17/2017); Holmium laser application (Left, 8/59/9234); Cardioversion (N/A, 01/07/2018); IR REMOVAL TUN ACCESS W/ PORT W/O FL MOD SED (04/20/2018); Cardiac catheterization; Reverse shoulder arthroplasty (Right, 05/14/2018); fatty mass removal (1999); Laparoscopic gastric banding; REVERSE SHOULDER REPLACEMENT (Right, 05/14/2018); RIGHT HEART CATH (N/A, 07/21/2019); and TEE without cardioversion (N/A, 08/04/2019).  Family History:  Her family history includes Colon cancer in her father; Diabetes in her brother, paternal aunt, paternal grandmother, and sister; Heart attack in her father; Heart disease (age of onset: 29) in her father; Other in her mother.  Social History:  She  reports that she has never smoked. She has never used smokeless tobacco. She reports that she does not drink alcohol or use drugs.

## 2019-10-05 ENCOUNTER — Encounter (HOSPITAL_COMMUNITY): Payer: Medicare Other

## 2019-10-07 ENCOUNTER — Encounter (HOSPITAL_COMMUNITY): Payer: Medicare Other

## 2019-10-09 NOTE — Progress Notes (Signed)
PPM remote 

## 2019-10-12 ENCOUNTER — Encounter (HOSPITAL_COMMUNITY): Payer: Medicare Other

## 2019-10-14 ENCOUNTER — Encounter (HOSPITAL_COMMUNITY): Payer: Medicare Other

## 2019-10-19 ENCOUNTER — Encounter (HOSPITAL_COMMUNITY): Payer: Medicare Other

## 2019-10-21 ENCOUNTER — Encounter (HOSPITAL_COMMUNITY): Payer: Medicare Other

## 2019-10-22 ENCOUNTER — Other Ambulatory Visit: Payer: Self-pay | Admitting: Internal Medicine

## 2019-10-23 ENCOUNTER — Ambulatory Visit: Payer: Medicare Other | Attending: Internal Medicine

## 2019-10-23 ENCOUNTER — Ambulatory Visit: Payer: Medicare Other

## 2019-10-23 DIAGNOSIS — Z23 Encounter for immunization: Secondary | ICD-10-CM | POA: Insufficient documentation

## 2019-10-23 NOTE — Progress Notes (Signed)
   Covid-19 Vaccination Clinic  Name:  Kayla Harrison    MRN: [id number] DOB: Feb 10, 1946  10/23/2019  [name] was observed post Covid-19 immunization for 15 minutes without incidence. She was provided with Vaccine Information Sheet and instruction to access the V-Safe system.   [name] was instructed to call 911 with any severe reactions post vaccine: Marland Kitchen Difficulty breathing  . Swelling of your face and throat  . A fast heartbeat  . A bad rash all over your body  . Dizziness and weakness    Immunizations Administered    Name Date Dose VIS Date Route   Pfizer COVID-19 Vaccine 10/23/2019  1:47 PM 0.3 mL 09/03/2019 Intramuscular   Manufacturer: [place]   Lot: [id number]   [place]: [address]-1

## 2019-10-26 ENCOUNTER — Encounter (HOSPITAL_COMMUNITY): Payer: Medicare Other

## 2019-10-27 ENCOUNTER — Other Ambulatory Visit: Payer: Self-pay | Admitting: Endocrinology

## 2019-10-27 DIAGNOSIS — Z1231 Encounter for screening mammogram for malignant neoplasm of breast: Secondary | ICD-10-CM

## 2019-10-28 ENCOUNTER — Encounter (HOSPITAL_COMMUNITY): Payer: Medicare Other

## 2019-11-01 ENCOUNTER — Telehealth (HOSPITAL_COMMUNITY): Payer: Self-pay | Admitting: *Deleted

## 2019-11-01 NOTE — Telephone Encounter (Signed)
Kayla Harrison would like to change her pulmonary rehab class time to 1315 to begin 11/16/19.

## 2019-11-01 NOTE — Telephone Encounter (Signed)
Called to inform patient that we will resume in person pulmonary rehab exercise classes beginning Tuesday, 11/16/2019.  She would prefer changing to the 10:15 am class.

## 2019-11-09 NOTE — Progress Notes (Addendum)
Pulmonary Individual Treatment Plan  Patient Details  Name: DANIJA GOSA MRN: [id number] Date of Birth: [birth year] Referring Provider:     Pulmonary Rehab Walk Test from 08/24/2019 in [facility]  Referring Provider  Dr. Aundra Dubin      Initial Encounter Date:    Pulmonary Rehab Walk Test from 08/24/2019 in [facility]  Date  08/24/19      Visit Diagnosis: Heart failure, diastolic, chronic ([place])  Patient's Home Medications on Admission:   Current Outpatient Medications:  .  acetaminophen (TYLENOL) 500 MG tablet, Take 1,000 mg by mouth every 6 (six) hours as needed for moderate pain or headache., Disp: , Rfl:  .  amiodarone (PACERONE) 200 MG tablet, TAKE 1 TABLET BY MOUTH  DAILY, Disp: 90 tablet, Rfl: 3 .  apixaban (ELIQUIS) 5 MG TABS tablet, Take 5 mg by mouth 2 (two) times daily., Disp: , Rfl:  .  levothyroxine (SYNTHROID) 137 MCG tablet, Take 137 mcg by mouth See admin instructions. TAKE 1 TABLET (137 MCG) BY MOUTH DAILY, EXCEPT SATURDAYS, Disp: , Rfl:  .  Lifitegrast (XIIDRA) 5 % SOLN, Apply 1 drop to eye at bedtime. , Disp: , Rfl:  .  Prenatal MV & Min w/FA-DHA (PRENATAL ADULT GUMMY/DHA/FA PO), Take 1 capsule by mouth 2 (two) times daily., Disp: , Rfl:  .  rosuvastatin (CRESTOR) 10 MG tablet, Take 1 tablet by mouth 2 (two) times a week. Takes on Monday and Thursday, Disp: , Rfl:   Current Facility-Administered Medications:  .  sodium chloride flush (NS) 0.9 % injection 3 mL, 3 mL, Intravenous, Q12H, Deboraha Sprang, MD  Past Medical History: Past Medical History:  Diagnosis Date  . Anemia   . Arthritis    osteoarthritis - knees and right shoulder  . Atrial fibrillation [facility])    ablation- 2x's-- 1st time- [facility], 2nd event at [facility] in 2008. Convergent ablation at [facility] 6/14  . Atrial fibrillation ([place])   . Blood transfusion without reported diagnosis   . Breast cancer ([place])    Dr Margot Chimes, total thyroidectomy-  1999- for cancer  . 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 Chronic bilateral pleural effusions   . Colon polyp    Dr Earlean Shawl  . Complete heart block ([place])   . Complication of anesthesia    Ketamine produces LSD reaction, bright colored nightmarish experience   . Dyslipidemia   . Dyspnea   . Endometriosis   . Fibroids   . H/O pleural effusion    s/p thoracentesis w 3235m withdrawn  . Hepatitis    [place]hepatitis   . History of dysphagia    due to radiation therapy  . History of hiatal hernia    small noted on PET scan  . History of kidney stones   . Hx of thyroid cancer    Dr SForde Dandy . Hyperlipidemia   . Hypertension   . Hypothyroidism   . [place]   radiation RX completed 03/04/17; will start chemo 6/27, pt unaware of lung cancer  . Morbid [place] ([place]    Status post lap band surgery  . Nephrolithiasis   . [place] Hodgkin's [place]    on chemotherapy  . Persistent atrial fibrillation ([place]    a. s/p PVI 2008 b. s/p convergent ablation 22841complicated by bradycardia requiring pacemaker implant  . Personal history of radiation therapy   . Presence of permanent cardiac pacemaker   .  Rotator cuff tear    Right  . Sinus node dysfunction ([place])    Complicating convergent ablation 6/14  . Stroke [facility])    2003- [place] x2  . SVC syndrome    with lung mass and non hodgkins lymphoma  . Thyroid cancer ([place]) 2000    Tobacco Use: Social History   Tobacco Use  Smoking Status Never Smoker  Smokeless Tobacco Never Used    Labs: Recent Review Flowsheet Data    Labs for ITP Cardiac and Pulmonary Rehab Latest Ref Rng & Units 01/25/2013 01/25/2013 02/04/2013 07/26/2013 07/21/2019   Cholestrol 0 - 200 mg/dL - - - 200 -   LDLCALC 0 - 99 mg/dL - - - 116(H) -   LDLDIRECT mg/dL - - - - -   HDL >39.00 mg/dL - - - 51.80 -   Trlycerides 0.0 - 149.0 mg/dL - - - 162.0(H) -   Hemoglobin A1c 4.6 - 6.5 % - - 5.9 - -   PHART 7.350 - 7.450 -  7.437 - - -   PCO2ART 35.0 - 45.0 mmHg - 37.4 - - -   HCO3 20.0 - 28.0 mmol/L 25.8(H) 25.2(H) - - 24.9   TCO2 22 - 32 mmol/L 27 26 - - 26   O2SAT % 66.0 95.0 - - 70.0      Capillary Blood Glucose: Lab Results  Component Value Date   GLUCAP 81 12/17/2018   GLUCAP 72 08/27/2017   GLUCAP 107 (H) 02/19/2017   GLUCAP 83 12/08/2013   GLUCAP 88 12/08/2013     Pulmonary Assessment Scores: Pulmonary Assessment Scores    Row Name 08/24/19 1548 08/24/19 1603       ADL UCSD   ADL Phase  Entry  Entry    SOB Score total  --  42      CAT Score   CAT Score  --  14      mMRC Score   mMRC Score  4  --      UCSD: Self-administered rating of dyspnea associated with activities of daily living (ADLs) 6-point scale (0 = "not at all" to 5 = "maximal or unable to do because of breathlessness")  Scoring Scores range from 0 to 120.  Minimally important difference is 5 units  CAT: CAT can identify the health impairment of COPD patients and is better correlated with disease progression.  CAT has a scoring range of zero to 40. The CAT score is classified into four groups of low (less than 10), medium (10 - 20), high (21-30) and very high (31-40) based on the impact level of disease on health status. A CAT score over 10 suggests significant symptoms.  A worsening CAT score could be explained by an exacerbation, poor medication adherence, poor inhaler technique, or progression of COPD or comorbid conditions.  CAT MCID is 2 points  mMRC: mMRC (Modified Medical Research Council) Dyspnea Scale is used to assess the degree of baseline functional disability in patients of respiratory disease due to dyspnea. No minimal important difference is established. A decrease in score of 1 point or greater is considered a positive change.   Pulmonary Function Assessment: Pulmonary Function Assessment - 08/24/19 1600      Breath   Bilateral Breath Sounds  Clear    Shortness of Breath  Yes;Limiting activity        Exercise Target Goals: Exercise Program Goal: Individual exercise prescription set using results from initial 6 min walk test and THRR while considering  patient's activity  barriers and safety.   Exercise Prescription Goal: Initial exercise prescription builds to 30-45 minutes a day of aerobic activity, 2-3 days per week.  Home exercise guidelines will be given to patient during program as part of exercise prescription that the participant will acknowledge.  Activity Barriers & Risk Stratification: Activity Barriers & Cardiac Risk Stratification - 08/24/19 1503      Activity Barriers & Cardiac Risk Stratification   Activity Barriers  Arthritis;Right Knee Replacement;Deconditioning;Muscular Weakness       6 Minute Walk: 6 Minute Walk    Row Name 08/24/19 1548         6 Minute Walk   Phase  Initial     Distance  884 feet     Walk Time  6 minutes     # of Rest Breaks  0     MPH  1.67     METS  1.41     RPE  17     Perceived Dyspnea   3     VO2 Peak  4.92     Symptoms  Yes (comment)     Comments  used walking cane     Resting HR  70 bpm     Resting BP  122/76     Resting Oxygen Saturation   100 %     Exercise Oxygen Saturation  during 6 min walk  93 %     Max Ex. HR  99 bpm     Max Ex. BP  146/80     2 Minute Post BP  116/70       Interval HR   1 Minute HR  87     2 Minute HR  99     3 Minute HR  94     4 Minute HR  95     5 Minute HR  94     6 Minute HR  96     2 Minute Post HR  78     Interval Heart Rate?  Yes       Interval Oxygen   Interval Oxygen?  Yes     Baseline Oxygen Saturation %  100 %     1 Minute Oxygen Saturation %  99 %     1 Minute Liters of Oxygen  0 L     2 Minute Oxygen Saturation %  98 %     2 Minute Liters of Oxygen  0 L     3 Minute Oxygen Saturation %  94 %     3 Minute Liters of Oxygen  0 L     4 Minute Oxygen Saturation %  93 %     4 Minute Liters of Oxygen  0 L     5 Minute Oxygen Saturation %  98 %     5 Minute Liters of  Oxygen  0 L     6 Minute Oxygen Saturation %  93 %     6 Minute Liters of Oxygen  0 L     2 Minute Post Oxygen Saturation %  99 %     2 Minute Post Liters of Oxygen  0 L        Oxygen Initial Assessment: Oxygen Initial Assessment - 08/24/19 1547      Home Oxygen   Home Oxygen Device  None    Sleep Oxygen Prescription  None    Home Exercise Oxygen Prescription  None    Home at Rest Exercise Oxygen Prescription  None    Compliance with Home Oxygen Use  Yes      Initial 6 min Walk   Oxygen Used  None      Program Oxygen Prescription   Program Oxygen Prescription  None      Intervention   Short Term Goals  To learn and exhibit compliance with exercise, home and travel O2 prescription;To learn and understand importance of monitoring SPO2 with pulse oximeter and demonstrate accurate use of the pulse oximeter.;To learn and understand importance of maintaining oxygen saturations>88%;To learn and demonstrate proper pursed lip breathing techniques or other breathing techniques.;To learn and demonstrate proper use of respiratory medications    Long  Term Goals  Exhibits compliance with exercise, home and travel O2 prescription;Verbalizes importance of monitoring SPO2 with pulse oximeter and return demonstration;Maintenance of O2 saturations>88%;Exhibits proper breathing techniques, such as pursed lip breathing or other method taught during program session;Compliance with respiratory medication       Oxygen Re-Evaluation: Oxygen Re-Evaluation    Row Name 09/06/19 1011 11/09/19 0911           Program Oxygen Prescription   Program Oxygen Prescription  None  None        Home Oxygen   Home Oxygen Device  None  None      Sleep Oxygen Prescription  None  None      Home Exercise Oxygen Prescription  None  None      Home at Rest Exercise Oxygen Prescription  None  None      Compliance with Home Oxygen Use  Yes  Yes        Goals/Expected Outcomes   Short Term Goals  To learn and exhibit  compliance with exercise, home and travel O2 prescription;To learn and understand importance of monitoring SPO2 with pulse oximeter and demonstrate accurate use of the pulse oximeter.;To learn and understand importance of maintaining oxygen saturations>88%;To learn and demonstrate proper pursed lip breathing techniques or other breathing techniques.;To learn and demonstrate proper use of respiratory medications  To learn and exhibit compliance with exercise, home and travel O2 prescription;To learn and understand importance of monitoring SPO2 with pulse oximeter and demonstrate accurate use of the pulse oximeter.;To learn and understand importance of maintaining oxygen saturations>88%;To learn and demonstrate proper pursed lip breathing techniques or other breathing techniques.;To learn and demonstrate proper use of respiratory medications      Long  Term Goals  Exhibits compliance with exercise, home and travel O2 prescription;Verbalizes importance of monitoring SPO2 with pulse oximeter and return demonstration;Maintenance of O2 saturations>88%;Exhibits proper breathing techniques, such as pursed lip breathing or other method taught during program session;Compliance with respiratory medication  Exhibits compliance with exercise, home and travel O2 prescription;Verbalizes importance of monitoring SPO2 with pulse oximeter and return demonstration;Maintenance of O2 saturations>88%;Exhibits proper breathing techniques, such as pursed lip breathing or other method taught during program session;Compliance with respiratory medication      Goals/Expected Outcomes  compliance  compliance         Oxygen Discharge (Final Oxygen Re-Evaluation): Oxygen Re-Evaluation - 11/09/19 0911      Program Oxygen Prescription   Program Oxygen Prescription  None      Home Oxygen   Home Oxygen Device  None    Sleep Oxygen Prescription  None    Home Exercise Oxygen Prescription  None    Home at Rest Exercise Oxygen  Prescription  None    Compliance with Home Oxygen Use  Yes      Goals/Expected Outcomes  Short Term Goals  To learn and exhibit compliance with exercise, home and travel O2 prescription;To learn and understand importance of monitoring SPO2 with pulse oximeter and demonstrate accurate use of the pulse oximeter.;To learn and understand importance of maintaining oxygen saturations>88%;To learn and demonstrate proper pursed lip breathing techniques or other breathing techniques.;To learn and demonstrate proper use of respiratory medications    Long  Term Goals  Exhibits compliance with exercise, home and travel O2 prescription;Verbalizes importance of monitoring SPO2 with pulse oximeter and return demonstration;Maintenance of O2 saturations>88%;Exhibits proper breathing techniques, such as pursed lip breathing or other method taught during program session;Compliance with respiratory medication    Goals/Expected Outcomes  compliance       Initial Exercise Prescription: Initial Exercise Prescription - 08/24/19 1600      Date of Initial Exercise RX and Referring Provider   Date  08/24/19    Referring Provider  Dr. Aundra Dubin      NuStep   Level  2    SPM  80    Minutes  15      Arm Ergometer   Level  1    RPM  30    Minutes  15      Prescription Details   Frequency (times per week)  2    Duration  Progress to 30 minutes of continuous aerobic without signs/symptoms of physical distress      Intensity   THRR 40-80% of Max Heartrate  58-115    Ratings of Perceived Exertion  11-15    Perceived Dyspnea  0-4      Progression   Progression  Continue to progress workloads to maintain intensity without signs/symptoms of physical distress.      Resistance Training   Training Prescription  Yes    Weight  orange bands    Reps  10-15       Perform Capillary Blood Glucose checks as needed.  Exercise Prescription Changes: Exercise Prescription Changes    Row Name 09/07/19 1500 09/21/19  1500           Response to Exercise   Blood Pressure (Admit)  100/70  106/70      Blood Pressure (Exercise)  120/60  120/62      Blood Pressure (Exit)  108/70  104/66      Heart Rate (Admit)  76 bpm  77 bpm      Heart Rate (Exercise)  77 bpm  83 bpm      Heart Rate (Exit)  72 bpm  75 bpm      Oxygen Saturation (Admit)  98 %  98 %      Oxygen Saturation (Exercise)  96 %  94 %      Oxygen Saturation (Exit)  100 %  98 %      Rating of Perceived Exertion (Exercise)  13  13      Perceived Dyspnea (Exercise)  1  2      Duration  Continue with 30 min of aerobic exercise without signs/symptoms of physical distress.  Continue with 30 min of aerobic exercise without signs/symptoms of physical distress.      Intensity  -- 40-80% HRR  THRR unchanged        Progression   Progression  Continue to progress workloads to maintain intensity without signs/symptoms of physical distress.  Continue to progress workloads to maintain intensity without signs/symptoms of physical distress.        Resistance Training   Training Prescription  Yes  Yes      Weight  orange bands  orange bands      Reps  10-15  10-15      Time  10 Minutes  10 Minutes        Interval Training   Interval Training  No  No        Recumbant Bike   Level  --  1      Watts  --  10      Minutes  --  15        NuStep   Level  2  3      SPM  80  80      Minutes  15  15      METs  1.6  1.7        Arm Ergometer   Level  1  --      RPM  30  --      Minutes  15  --         Exercise Comments:   Exercise Goals and Review: Exercise Goals    Row Name 08/24/19 1613 09/06/19 1012 11/09/19 0911         Exercise Goals   Increase Physical Activity  Yes  Yes  Yes     Intervention  Provide advice, education, support and counseling about physical activity/exercise needs.;Develop an individualized exercise prescription for aerobic and resistive training based on initial evaluation findings, risk stratification, comorbidities and  participant's personal goals.  Provide advice, education, support and counseling about physical activity/exercise needs.;Develop an individualized exercise prescription for aerobic and resistive training based on initial evaluation findings, risk stratification, comorbidities and participant's personal goals.  Provide advice, education, support and counseling about physical activity/exercise needs.;Develop an individualized exercise prescription for aerobic and resistive training based on initial evaluation findings, risk stratification, comorbidities and participant's personal goals.     Expected Outcomes  Short Term: Attend rehab on a regular basis to increase amount of physical activity.;Long Term: Add in home exercise to make exercise part of routine and to increase amount of physical activity.;Long Term: Exercising regularly at least 3-5 days a week.  Short Term: Attend rehab on a regular basis to increase amount of physical activity.;Long Term: Add in home exercise to make exercise part of routine and to increase amount of physical activity.;Long Term: Exercising regularly at least 3-5 days a week.  Short Term: Attend rehab on a regular basis to increase amount of physical activity.;Long Term: Add in home exercise to make exercise part of routine and to increase amount of physical activity.;Long Term: Exercising regularly at least 3-5 days a week.     Increase Strength and Stamina  Yes  Yes  Yes     Intervention  Provide advice, education, support and counseling about physical activity/exercise needs.;Develop an individualized exercise prescription for aerobic and resistive training based on initial evaluation findings, risk stratification, comorbidities and participant's personal goals.  Provide advice, education, support and counseling about physical activity/exercise needs.;Develop an individualized exercise prescription for aerobic and resistive training based on initial evaluation findings, risk  stratification, comorbidities and participant's personal goals.  Provide advice, education, support and counseling about physical activity/exercise needs.;Develop an individualized exercise prescription for aerobic and resistive training based on initial evaluation findings, risk stratification, comorbidities and participant's personal goals.     Expected Outcomes  Short Term: Increase workloads from initial exercise prescription for resistance, speed, and METs.;Short Term: Perform resistance training exercises routinely during rehab and add in  resistance training at home;Long Term: Improve cardiorespiratory fitness, muscular endurance and strength as measured by increased METs and functional capacity (6MWT)  Short Term: Increase workloads from initial exercise prescription for resistance, speed, and METs.;Short Term: Perform resistance training exercises routinely during rehab and add in resistance training at home;Long Term: Improve cardiorespiratory fitness, muscular endurance and strength as measured by increased METs and functional capacity (6MWT)  Short Term: Increase workloads from initial exercise prescription for resistance, speed, and METs.;Short Term: Perform resistance training exercises routinely during rehab and add in resistance training at home;Long Term: Improve cardiorespiratory fitness, muscular endurance and strength as measured by increased METs and functional capacity (6MWT)     Able to understand and use rate of perceived exertion (RPE) scale  Yes  Yes  Yes     Intervention  Provide education and explanation on how to use RPE scale  Provide education and explanation on how to use RPE scale  Provide education and explanation on how to use RPE scale     Expected Outcomes  Short Term: Able to use RPE daily in rehab to express subjective intensity level;Long Term:  Able to use RPE to guide intensity level when exercising independently  Short Term: Able to use RPE daily in rehab to express  subjective intensity level;Long Term:  Able to use RPE to guide intensity level when exercising independently  Short Term: Able to use RPE daily in rehab to express subjective intensity level;Long Term:  Able to use RPE to guide intensity level when exercising independently     Able to understand and use Dyspnea scale  Yes  Yes  Yes     Intervention  Provide education and explanation on how to use Dyspnea scale  Provide education and explanation on how to use Dyspnea scale  Provide education and explanation on how to use Dyspnea scale     Expected Outcomes  Short Term: Able to use Dyspnea scale daily in rehab to express subjective sense of shortness of breath during exertion;Long Term: Able to use Dyspnea scale to guide intensity level when exercising independently  Short Term: Able to use Dyspnea scale daily in rehab to express subjective sense of shortness of breath during exertion;Long Term: Able to use Dyspnea scale to guide intensity level when exercising independently  Short Term: Able to use Dyspnea scale daily in rehab to express subjective sense of shortness of breath during exertion;Long Term: Able to use Dyspnea scale to guide intensity level when exercising independently     Knowledge and understanding of Target Heart Rate Range (THRR)  Yes  Yes  Yes     Expected Outcomes  Short Term: Able to state/look up THRR;Short Term: Able to use daily as guideline for intensity in rehab;Long Term: Able to use THRR to govern intensity when exercising independently  Short Term: Able to state/look up THRR;Short Term: Able to use daily as guideline for intensity in rehab;Long Term: Able to use THRR to govern intensity when exercising independently  Short Term: Able to state/look up THRR;Short Term: Able to use daily as guideline for intensity in rehab;Long Term: Able to use THRR to govern intensity when exercising independently     Understanding of Exercise Prescription  Yes  Yes  Yes     Intervention  Provide  education, explanation, and written materials on patient's individual exercise prescription  Provide education, explanation, and written materials on patient's individual exercise prescription  Provide education, explanation, and written materials on patient's individual exercise prescription  Expected Outcomes  Short Term: Able to explain program exercise prescription;Long Term: Able to explain home exercise prescription to exercise independently  Short Term: Able to explain program exercise prescription;Long Term: Able to explain home exercise prescription to exercise independently  Short Term: Able to explain program exercise prescription;Long Term: Able to explain home exercise prescription to exercise independently        Exercise Goals Re-Evaluation : Exercise Goals Re-Evaluation    [place] Name 09/06/19 1013 11/09/19 0912           Exercise Goal Re-Evaluation   Exercise Goals Review  Increase Physical Activity;Increase Strength and Stamina;Able to understand and use rate of perceived exertion (RPE) scale;Able to understand and use Dyspnea scale;Knowledge and understanding of Target Heart Rate Range (THRR);Understanding of Exercise Prescription  Increase Physical Activity;Increase Strength and Stamina;Able to understand and use rate of perceived exertion (RPE) scale;Able to understand and use Dyspnea scale;Knowledge and understanding of Target Heart Rate Range (THRR);Understanding of Exercise Prescription      Comments  Pt has completed 1 exercise session. Pt exercised at 1.5 METs on the stepper. Will continue to monitor and progress as able.  It is unknown if pt has exercised since we ceased in person exercise, as she did not wish to be called. Pt will return 2/23. Will monitor and progress as able.      Expected Outcomes  Through exercise at rehab and at home, the patient will decrease shortness of breath with daily activities and feel confident in carrying out an exercise regime at home.   Through exercise at rehab and at home, the patient will decrease shortness of breath with daily activities and feel confident in carrying out an exercise regime at home.         Discharge Exercise Prescription (Final Exercise Prescription Changes): Exercise Prescription Changes - 09/21/19 1500      Response to Exercise   Blood Pressure (Admit)  106/70    Blood Pressure (Exercise)  120/62    Blood Pressure (Exit)  104/66    Heart Rate (Admit)  77 bpm    Heart Rate (Exercise)  83 bpm    Heart Rate (Exit)  75 bpm    Oxygen Saturation (Admit)  98 %    Oxygen Saturation (Exercise)  94 %    Oxygen Saturation (Exit)  98 %    Rating of Perceived Exertion (Exercise)  13    Perceived Dyspnea (Exercise)  2    Duration  Continue with 30 min of aerobic exercise without signs/symptoms of physical distress.    Intensity  THRR unchanged      Progression   Progression  Continue to progress workloads to maintain intensity without signs/symptoms of physical distress.      Resistance Training   Training Prescription  Yes    Weight  orange bands    Reps  10-15    Time  10 Minutes      Interval Training   Interval Training  No      Recumbant Bike   Level  1    Watts  10    Minutes  15      NuStep   Level  3    SPM  80    Minutes  15    METs  1.7       Nutrition:  Target Goals: Understanding of nutrition guidelines, daily intake of sodium '1500mg'$ , cholesterol '200mg'$ , calories 30% from fat and 7% or less from saturated fats, daily to have 5  or more servings of fruits and vegetables.  Biometrics: Pre Biometrics - 08/24/19 1504      Pre Biometrics   Height  '5\' 6"'$  (1.676 m)    Weight  103.5 kg    BMI (Calculated)  36.85    Grip Strength  18.5 kg        Nutrition Therapy Plan and Nutrition Goals: Nutrition Therapy & Goals - 09/02/19 1513      Nutrition Therapy   Diet  Mediterranean      Personal Nutrition Goals   Nutrition Goal  Pt to identify and limit food sources of  saturated fat, trans fat, refined carbohydrates and sodium    Personal Goal #2  Pt to identify food quantities necessary to achieve weight loss of 6-24 lb at graduation from cardiac rehab.      Intervention Plan   Intervention  Nutrition handout(s) given to patient.;Prescribe, educate and counsel regarding individualized specific dietary modifications aiming towards targeted core components such as weight, hypertension, lipid management, diabetes, heart failure and other comorbidities.    Expected Outcomes  Short Term Goal: Understand basic principles of dietary content, such as calories, fat, sodium, cholesterol and nutrients.;Short Term Goal: A plan has been developed with personal nutrition goals set during dietitian appointment.;Long Term Goal: Adherence to prescribed nutrition plan.       Nutrition Assessments: Nutrition Assessments - 09/02/19 1522      Rate Your Plate Scores   Pre Score  60       Nutrition Goals Re-Evaluation: Nutrition Goals Re-Evaluation    [place] Name 09/02/19 1522             Goals   Current Weight  228 lb (103.4 kg)       Nutrition Goal  Pt to identify and limit food sources of saturated fat, trans fat, refined carbohydrates and sodium         Personal Goal #2 Re-Evaluation   Personal Goal #2  Pt to identify food quantities necessary to achieve weight loss of 6-24 lb at graduation from cardiac rehab.          Nutrition Goals Discharge (Final Nutrition Goals Re-Evaluation): Nutrition Goals Re-Evaluation - 09/02/19 1522      Goals   Current Weight  228 lb (103.4 kg)    Nutrition Goal  Pt to identify and limit food sources of saturated fat, trans fat, refined carbohydrates and sodium      Personal Goal #2 Re-Evaluation   Personal Goal #2  Pt to identify food quantities necessary to achieve weight loss of 6-24 lb at graduation from cardiac rehab.       Psychosocial: Target Goals: Acknowledge presence or absence of significant depression and/or  stress, maximize coping skills, provide positive support system. Participant is able to verbalize types and ability to use techniques and skills needed for reducing stress and depression.  Initial Review & Psychosocial Screening: Initial Psych Review & Screening - 08/24/19 1605      Initial Review   Current issues with  None Identified      Family Dynamics   Good Support System?  Yes      Barriers   Psychosocial barriers to participate in program  There are no identifiable barriers or psychosocial needs.      Screening Interventions   Interventions  Encouraged to exercise       Quality of Life Scores:  Scores of 19 and below usually indicate a poorer quality of life in these areas.  A difference  of  2-3 points is a clinically meaningful difference.  A difference of 2-3 points in the total score of the Quality of Life Index has been associated with significant improvement in overall quality of life, self-image, physical symptoms, and general health in studies assessing change in quality of life.  PHQ-9: Recent Review Flowsheet Data    Depression screen [facility] 2/9 08/24/2019   Decreased Interest 0   Down, Depressed, Hopeless 0   PHQ - 2 Score 0   Altered sleeping 1   Tired, decreased energy 1   Change in appetite 0   Feeling bad or failure about yourself  0   Trouble concentrating 0   Moving slowly or fidgety/restless 0   Suicidal thoughts 0   Difficult doing work/chores Not difficult at all     Interpretation of Total Score  Total Score Depression Severity:  1-4 = Minimal depression, 5-9 = Mild depression, 10-14 = Moderate depression, 15-19 = Moderately severe depression, 20-27 = Severe depression   Psychosocial Evaluation and Intervention: Psychosocial Evaluation - 08/24/19 1605      Psychosocial Evaluation & Interventions   Interventions  Encouraged to exercise with the program and follow exercise prescription    Continue Psychosocial Services   No Follow up required        Psychosocial Re-Evaluation: Psychosocial Re-Evaluation    [place] Name 09/06/19 1313 11/08/19 1408           Psychosocial Re-Evaluation   Current issues with  None Identified  None Identified      Comments  No barriers or psychosocial concerns identified at this time  department closure x 7 weeks, reopen 11/16/2019, will assess for psychosocial concerns      Expected Outcomes  That patient has no barriers or psychosocial concerns while participating in pulmonary rehab.  No barriers or psychosocial concerns while in pulmonary rehab.      Interventions  Encouraged to attend Pulmonary Rehabilitation for the exercise  Encouraged to attend Pulmonary Rehabilitation for the exercise      Continue Psychosocial Services   No Follow up required  No Follow up required         Psychosocial Discharge (Final Psychosocial Re-Evaluation): Psychosocial Re-Evaluation - 11/08/19 1408      Psychosocial Re-Evaluation   Current issues with  None Identified    Comments  department closure x 7 weeks, reopen 11/16/2019, will assess for psychosocial concerns    Expected Outcomes  No barriers or psychosocial concerns while in pulmonary rehab.    Interventions  Encouraged to attend Pulmonary Rehabilitation for the exercise    Continue Psychosocial Services   No Follow up required       Education: Education Goals: Education classes will be provided on a weekly basis, covering required topics. Participant will state understanding/return demonstration of topics presented.  Learning Barriers/Preferences: Learning Barriers/Preferences - 08/24/19 1607      Learning Barriers/Preferences   Learning Barriers  None    Learning Preferences  Audio;Computer/Internet;Group Instruction;Individual Instruction;Pictoral;Skilled Demonstration;Verbal Instruction;Video;Written Material       Education Topics: Risk Factor Reduction:  -Group instruction that is supported by a PowerPoint presentation. Instructor discusses the  definition of a risk factor, different risk factors for pulmonary disease, and how the heart and lungs work together.     Nutrition for Pulmonary Patient:  -Group instruction provided by PowerPoint slides, verbal discussion, and written materials to support subject matter. The instructor gives an explanation and review of healthy diet recommendations, which includes a discussion on weight management,  recommendations for fruit and vegetable consumption, as well as protein, fluid, caffeine, fiber, sodium, sugar, and alcohol. Tips for eating when patients are short of breath are discussed.   PULMONARY REHAB OTHER RESPIRATORY from 09/14/2019 in [place]  Date  09/14/19  Educator  -- [Handout]      Pursed Lip Breathing:  -Group instruction that is supported by demonstration and informational handouts. Instructor discusses the benefits of pursed lip and diaphragmatic breathing and detailed demonstration on how to preform both.     Oxygen Safety:  -Group instruction provided by PowerPoint, verbal discussion, and written material to support subject matter. There is an overview of "What is Oxygen" and "Why do we need it".  Instructor also reviews how to create a safe environment for oxygen use, the importance of using oxygen as prescribed, and the risks of noncompliance. There is a brief discussion on traveling with oxygen and resources the patient may utilize.   Oxygen Equipment:  -Group instruction provided by [facility] Staff utilizing handouts, written materials, and equipment demonstrations.   Signs and Symptoms:  -Group instruction provided by written material and verbal discussion to support subject matter. Warning signs and symptoms of infection, stroke, and heart attack are reviewed and when to call the physician/911 reinforced. Tips for preventing the spread of infection discussed.   Advanced Directives:  -Group instruction provided by verbal  instruction and written material to support subject matter. Instructor reviews Advanced Directive laws and proper instruction for filling out document.   Pulmonary Video:  -Group video education that reviews the importance of medication and oxygen compliance, exercise, good nutrition, pulmonary hygiene, and pursed lip and diaphragmatic breathing for the pulmonary patient.   Exercise for the Pulmonary Patient:  -Group instruction that is supported by a PowerPoint presentation. Instructor discusses benefits of exercise, core components of exercise, frequency, duration, and intensity of an exercise routine, importance of utilizing pulse oximetry during exercise, safety while exercising, and options of places to exercise outside of rehab.     Pulmonary Medications:  -Verbally interactive group education provided by instructor with focus on inhaled medications and proper administration.   PULMONARY REHAB OTHER RESPIRATORY from 09/14/2019 in [place]  Date  09/09/19  Educator  -- [Handout]      Anatomy and Physiology of the Respiratory System and Intimacy:  -Group instruction provided by PowerPoint, verbal discussion, and written material to support subject matter. Instructor reviews respiratory cycle and anatomical components of the respiratory system and their functions. Instructor also reviews differences in obstructive and restrictive respiratory diseases with examples of each. Intimacy, Sex, and Sexuality differences are reviewed with a discussion on how relationships can change when diagnosed with pulmonary disease. Common sexual concerns are reviewed.   MD DAY -A group question and answer session with a medical doctor that allows participants to ask questions that relate to their pulmonary disease state.   OTHER EDUCATION -Group or individual verbal, written, or video instructions that support the educational goals of the pulmonary rehab  program.   Holiday Eating Survival Tips:  -Group instruction provided by PowerPoint slides, verbal discussion, and written materials to support subject matter. The instructor gives patients tips, tricks, and techniques to help them not only survive but enjoy the holidays despite the onslaught of food that accompanies the holidays.   Knowledge Questionnaire Score:   Core Components/Risk Factors/Patient Goals at Admission: Personal Goals and Risk Factors at Admission - 08/24/19 1608  Core Components/Risk Factors/Patient Goals on Admission   Improve shortness of breath with ADL's  Yes    Intervention  Provide education, individualized exercise plan and daily activity instruction to help decrease symptoms of SOB with activities of daily living.    Expected Outcomes  Short Term: Improve cardiorespiratory fitness to achieve a reduction of symptoms when performing ADLs;Long Term: Be able to perform more ADLs without symptoms or delay the onset of symptoms       Core Components/Risk Factors/Patient Goals Review:  Goals and Risk Factor Review    [place] Name 08/24/19 1609 09/06/19 1314 11/08/19 1410         Core Components/Risk Factors/Patient Goals Review   Personal Goals Review  Develop more efficient breathing techniques such as purse lipped breathing and diaphragmatic breathing and practicing self-pacing with activity.;Improve shortness of breath with ADL's  Improve shortness of breath with ADL's;Increase knowledge of respiratory medications and ability to use respiratory devices properly.;Develop more efficient breathing techniques such as purse lipped breathing and diaphragmatic breathing and practicing self-pacing with activity.;Heart Failure  Develop more efficient breathing techniques such as purse lipped breathing and diaphragmatic breathing and practicing self-pacing with activity.;Improve shortness of breath with ADL's;Increase knowledge of respiratory medications and ability to use  respiratory devices properly.;Heart Failure     Review  --  Patient just started pulmonary rehab, has attended 1 exercise session, heart failure was stable at this time, will continue to evaluate, too early to have achieved program goals.  Department closure x 7 weeks, will reopen 04/14/2020, will work with Patrici Ranks to continue working on admission goals.     Expected Outcomes  --  See admission goals.  See admission goals.        Core Components/Risk Factors/Patient Goals at Discharge (Final Review):  Goals and Risk Factor Review - 11/08/19 1410      Core Components/Risk Factors/Patient Goals Review   Personal Goals Review  Develop more efficient breathing techniques such as purse lipped breathing and diaphragmatic breathing and practicing self-pacing with activity.;Improve shortness of breath with ADL's;Increase knowledge of respiratory medications and ability to use respiratory devices properly.;Heart Failure    Review  Department closure x 7 weeks, will reopen 04/14/2020, will work with Patrici Ranks to continue working on admission goals.    Expected Outcomes  See admission goals.       ITP Comments:   Comments: ITP REVIEW Pt is making expected progress toward pulmonary rehab goals after completing 7 sessions. Recommend continued exercise, life style modification, education, and utilization of breathing techniques to increase stamina and strength and decrease shortness of breath with exertion.

## 2019-11-16 ENCOUNTER — Encounter (HOSPITAL_COMMUNITY)
Admission: RE | Admit: 2019-11-16 | Discharge: 2019-11-16 | Disposition: A | Discharge status: Home / Self Care | Payer: Medicare Other | Source: Ambulatory Visit | Attending: Cardiology | Admitting: Cardiology

## 2019-11-16 ENCOUNTER — Other Ambulatory Visit: Payer: Self-pay

## 2019-11-16 VITALS — Wt 227.5 lb

## 2019-11-16 DIAGNOSIS — I5032 Chronic diastolic (congestive) heart failure: Secondary | ICD-10-CM | POA: Insufficient documentation

## 2019-11-16 NOTE — Progress Notes (Signed)
Daily Session Note  Patient Details   Name: Kayla Harrison MRN: [id number] Date of Birth: 11/14/1945 Referring Provider:     Pulmonary Rehab Walk Test from 08/24/2019 in [place]  Referring Provider  Dr. Aundra Dubin      Encounter Date: 11/16/2019  Check In: Session Check In - 11/16/19 1323      Check-In   Supervising physician immediately available to respond to emergencies  [facility] immediately available    Physician(s)  Dr. Cyndia Skeeters    Location  [facility]    Staff Present  Rosebud Poles, RN, Bjorn Loser, MS, Exercise Physiologist;Lisa [name], RN    Virtual Visit  No    Medication changes reported      No    Fall or balance concerns reported     No    Tobacco Cessation  No Change    Warm-up and Cool-down  Performed as group-led instruction    Resistance Training Performed  Yes    VAD Patient?  No    PAD/SET Patient?  No      Pain Assessment   Currently in Pain?  No/denies    Multiple Pain Sites  No       Capillary Blood Glucose: No results found. However, due to the size of the patient record, not all encounters were searched. Please check Results Review for a complete set of results.  Exercise Prescription Changes - 11/16/19 1400      Response to Exercise   Blood Pressure (Admit)  100/70    Blood Pressure (Exercise)  108/70    Blood Pressure (Exit)  102/68    Heart Rate (Admit)  103 bpm    Heart Rate (Exercise)  116 bpm    Heart Rate (Exit)  99 bpm    Oxygen Saturation (Admit)  95 %    Oxygen Saturation (Exercise)  95 %    Oxygen Saturation (Exit)  94 %    Rating of Perceived Exertion (Exercise)  15    Perceived Dyspnea (Exercise)  3    Duration  Continue with 30 min of aerobic exercise without signs/symptoms of physical distress.    Intensity  THRR unchanged      Progression   Progression  Continue to progress workloads to maintain intensity without signs/symptoms of physical distress.      Resistance Training   Training Prescription  Yes    Weight  orange bands    Reps  10-15    Time  10 Minutes      Interval Training   Interval Training  No      Recumbant Bike   Level  1    Minutes  8      NuStep   Level  2    SPM  80    Minutes  15       Social History   Tobacco Use  Smoking Status Never Smoker  Smokeless Tobacco Never Used    Goals Met:  Independence with exercise equipment Exercise tolerated well Strength training completed today  Goals Unmet:  Not Applicable  Comments: Service time is from 1305 to 1405    Dr. Fransico Him is Medical Director for Cardiac Rehab at [facility].

## 2019-11-17 ENCOUNTER — Encounter (HOSPITAL_COMMUNITY): Payer: Self-pay | Admitting: Nurse Practitioner

## 2019-11-17 ENCOUNTER — Telehealth: Payer: Self-pay

## 2019-11-17 ENCOUNTER — Ambulatory Visit (HOSPITAL_COMMUNITY)
Admission: RE | Admit: 2019-11-17 | Discharge: 2019-11-17 | Disposition: A | Discharge status: Home / Self Care | Payer: Medicare Other | Source: Ambulatory Visit | Attending: Nurse Practitioner | Admitting: Nurse Practitioner

## 2019-11-17 VITALS — BP 112/80 | HR 104 | Ht 66.0 in | Wt 227.2 lb

## 2019-11-17 DIAGNOSIS — I442 Atrioventricular block, complete: Secondary | ICD-10-CM | POA: Diagnosis not present

## 2019-11-17 DIAGNOSIS — Z923 Personal history of irradiation: Secondary | ICD-10-CM | POA: Diagnosis not present

## 2019-11-17 DIAGNOSIS — D6869 Other thrombophilia: Secondary | ICD-10-CM

## 2019-11-17 DIAGNOSIS — Z833 Family history of diabetes mellitus: Secondary | ICD-10-CM | POA: Diagnosis not present

## 2019-11-17 DIAGNOSIS — C8590 Non-Hodgkin lymphoma, unspecified, unspecified site: Secondary | ICD-10-CM | POA: Diagnosis not present

## 2019-11-17 DIAGNOSIS — I10 Essential (primary) hypertension: Secondary | ICD-10-CM | POA: Diagnosis not present

## 2019-11-17 DIAGNOSIS — Z8673 Personal history of transient ischemic attack (TIA), and cerebral infarction without residual deficits: Secondary | ICD-10-CM | POA: Insufficient documentation

## 2019-11-17 DIAGNOSIS — Z9884 Bariatric surgery status: Secondary | ICD-10-CM | POA: Insufficient documentation

## 2019-11-17 DIAGNOSIS — Z85118 Personal history of other malignant neoplasm of bronchus and lung: Secondary | ICD-10-CM | POA: Diagnosis not present

## 2019-11-17 DIAGNOSIS — Z853 Personal history of malignant neoplasm of breast: Secondary | ICD-10-CM | POA: Diagnosis not present

## 2019-11-17 DIAGNOSIS — Z8249 Family history of ischemic heart disease and other diseases of the circulatory system: Secondary | ICD-10-CM | POA: Insufficient documentation

## 2019-11-17 DIAGNOSIS — Z6836 Body mass index (BMI) 36.0-36.9, adult: Secondary | ICD-10-CM | POA: Diagnosis not present

## 2019-11-17 DIAGNOSIS — Z85850 Personal history of malignant neoplasm of thyroid: Secondary | ICD-10-CM | POA: Insufficient documentation

## 2019-11-17 DIAGNOSIS — Z79899 Other long term (current) drug therapy: Secondary | ICD-10-CM | POA: Insufficient documentation

## 2019-11-17 DIAGNOSIS — E890 Postprocedural hypothyroidism: Secondary | ICD-10-CM | POA: Diagnosis not present

## 2019-11-17 DIAGNOSIS — Z888 Allergy status to other drugs, medicaments and biological substances status: Secondary | ICD-10-CM | POA: Diagnosis not present

## 2019-11-17 DIAGNOSIS — Z79890 Hormone replacement therapy (postmenopausal): Secondary | ICD-10-CM | POA: Insufficient documentation

## 2019-11-17 DIAGNOSIS — Z950 Presence of cardiac pacemaker: Secondary | ICD-10-CM | POA: Insufficient documentation

## 2019-11-17 DIAGNOSIS — Z800 Family history of malignant neoplasm of digestive organs: Secondary | ICD-10-CM | POA: Diagnosis not present

## 2019-11-17 DIAGNOSIS — E785 Hyperlipidemia, unspecified: Secondary | ICD-10-CM | POA: Insufficient documentation

## 2019-11-17 DIAGNOSIS — Z7901 Long term (current) use of anticoagulants: Secondary | ICD-10-CM | POA: Diagnosis not present

## 2019-11-17 DIAGNOSIS — M1990 Unspecified osteoarthritis, unspecified site: Secondary | ICD-10-CM | POA: Insufficient documentation

## 2019-11-17 DIAGNOSIS — I480 Paroxysmal atrial fibrillation: Secondary | ICD-10-CM | POA: Diagnosis present

## 2019-11-17 DIAGNOSIS — I4892 Unspecified atrial flutter: Secondary | ICD-10-CM | POA: Diagnosis not present

## 2019-11-17 LAB — COMPREHENSIVE METABOLIC PANEL
ALT: 18 U/L (ref 0–44)
AST: 20 U/L (ref 15–41)
Albumin: 3.6 g/dL (ref 3.5–5.0)
Alkaline Phosphatase: 76 U/L (ref 38–126)
Anion gap: 10 (ref 5–15)
BUN: 26 mg/dL — ABNORMAL HIGH (ref 8–23)
CO2: 25 mmol/L (ref 22–32)
Calcium: 9.5 mg/dL (ref 8.9–10.3)
Chloride: 107 mmol/L (ref 98–111)
Creatinine, Ser: 1.07 mg/dL — ABNORMAL HIGH (ref 0.44–1.00)
GFR calc Af Amer: 59 mL/min — ABNORMAL LOW (ref >60)
GFR calc non Af Amer: 51 mL/min — ABNORMAL LOW (ref >60)
Glucose, Bld: 107 mg/dL — ABNORMAL HIGH (ref 70–99)
Potassium: 4.8 mmol/L (ref 3.5–5.1)
Sodium: 142 mmol/L (ref 135–145)
Total Bilirubin: 0.8 mg/dL (ref 0.3–1.2)
Total Protein: 6.2 g/dL — ABNORMAL LOW (ref 6.5–8.1)

## 2019-11-17 LAB — CBC
HCT: 43.3 % (ref 36.0–46.0)
Hemoglobin: 13.7 g/dL (ref 12.0–15.0)
MCH: 30.1 pg (ref 26.0–34.0)
MCHC: 31.6 g/dL (ref 30.0–36.0)
MCV: 95.2 fL (ref 80.0–100.0)
Platelets: 190 10*3/uL (ref 150–400)
RBC: 4.55 MIL/uL (ref 3.87–5.11)
RDW: 14.5 % (ref 11.5–15.5)
WBC: 5.5 10*3/uL (ref 4.0–10.5)
nRBC: 0 % (ref 0.0–0.2)

## 2019-11-17 LAB — TSH: TSH: 0.842 u[IU]/mL (ref 0.350–4.500)

## 2019-11-17 NOTE — Telephone Encounter (Signed)
Spoke with pt in response to [employer].  Pt symptomatic and unsure if she is in AF/ AFl.  Advised pt we have not received and alert from her PM, she will send a manual transmission as soon as she is able to.

## 2019-11-17 NOTE — Patient Instructions (Signed)
Cardioversion scheduled for Monday, March 1st  - Arrive at the [facility] and go to admitting at [facility] not eat or drink anything after midnight the night prior to your procedure.  - Take all your morning medication with a sip of water prior to arrival.  - You will not be able to drive home after your procedure.

## 2019-11-17 NOTE — Telephone Encounter (Signed)
Manual transmission received.  Pt does appear to be in persistent AFl, controlled VR. Known history of AFl on [place] and Amiodarone.   Due to pt report of symptoms offered pt AF clinic.  Pt hesitant but agreeable as she feels they have not been able to help in the past.  She mentioned wanting CV.  Advised that if deemed appropriate the AF clinic would be able to arrange CV.   Pt mychart message regarding symptoms:   11/17/19 10:25 AM [name], I am experiencing light headedness/foggy head constantly now for 4 days. My pulse never goes below 100 while normal is 72 for me. BP is 108/80 and O2sat is 96-99%. I am extremely fatigued and just want to lay down and sleep all the time. This definitely is not my normal feeling. Am I possibly in flutter? I have felt a couple of times like I was going into AF but then that feeling dissipates so I am not sensing exactly what is going on....just feeling terrible.

## 2019-11-17 NOTE — Telephone Encounter (Signed)
Called to schedule appt, pt aware of appt later this afternoon @2 :30 pm with [name], NP.

## 2019-11-17 NOTE — Progress Notes (Signed)
Primary Care Physician: [name], MD Referring Physician: Dr. [name] is a 74 y.o. female with a h/o paroxysmal afib that is in the afib clinic today for device clinic reports that pt has been in atrial flutter for the last several days and this correlates with the pt's symptoms of feeling poorly x 4 days with elevated HR's. She continues on amiodarone daily and has not missed any eliquis 5 mg bid for a CHA2DS2VASc score of 4. She can not correlate any trigger with onset of atrial flutter.   Today, she denies symptoms of palpitations, chest pain, shortness of breath, orthopnea, PND, lower extremity edema, dizziness, presyncope, syncope, or neurologic sequela. The patient is tolerating medications without difficulties and is otherwise without complaint today.   Past Medical History:  Diagnosis Date  . Anemia   . Arthritis    osteoarthritis - knees and right shoulder  . Atrial fibrillation [facility])    ablation- 2x's-- 1st time- [facility], 2nd event at [facility] in 2008. Convergent ablation at [facility] 6/14  . Atrial fibrillation ([place])   . Blood transfusion without reported diagnosis   . Breast cancer ([place])    Dr Margot Chimes, total thyroidectomy- 1999- for cancer  . 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 Chronic bilateral pleural effusions   . Colon polyp    Dr Earlean Shawl  . Complete heart block ([place])   . Complication of anesthesia    Ketamine produces LSD reaction, bright colored nightmarish experience   . Dyslipidemia   . Dyspnea   . Endometriosis   . Fibroids   . H/O pleural effusion    s/p thoracentesis w 3284m withdrawn  . Hepatitis    [place] ?hepatitis   . History of dysphagia    due to radiation therapy  . History of hiatal hernia    small noted on PET scan  . History of kidney stones   . Hx of thyroid cancer    Dr SForde Dandy . Hyperlipidemia   . Hypertension   . Hypothyroidism   . Lung cancer, lower lobe ([place] 01[place]   radiation RX  completed 03/04/17; will start chemo 6/27, pt unaware of lung cancer  . Morbid [place] ([place]    Status post lap band surgery  . Nephrolithiasis   . Non Hodgkin's lymphoma ([place]    on chemotherapy  . Persistent atrial fibrillation ([place]    a. s/p PVI 2008 b. s/p convergent ablation 25726complicated by bradycardia requiring pacemaker implant  . Personal history of radiation therapy   . Presence of permanent cardiac pacemaker   . Rotator cuff tear    Right  . Sinus node dysfunction ([place]    Complicating convergent ablation 6/14  . [place]    [place]  . SVC syndrome    with lung mass and non hodgkins lymphoma  . Thyroid cancer ([place]   Past Surgical History:  Procedure Laterality Date  . ABDOMINAL HYSTERECTOMY  1983  . afib ablation     a. 2008 PVI b. 2014 convergent ablation  . APPENDECTOMY    . BONE MARROW BIOPSY  02/21/2017  . BREAST LUMPECTOMY Left 2010  . bso  1998  . CARDIAC CATHETERIZATION     2015- negative  . CARDIOVERSION  10/09/2012   Procedure: CARDIOVERSION;  Surgeon: JMinus Breeding MD;  [place]  Service: Cardiovascular;  Laterality: N/A;  . CARDIOVERSION  10/09/2012   Procedure: CARDIOVERSION;  Surgeon:  Minus Breeding, MD;  Location[facility] ENDOSCOPY;  Service: Cardiovascular;  Laterality: N/A;  [name] gave the ok to add pt to the add on , but we must check to find out if the can add pt on at [phone] ( [phone])  . CARDIOVERSION N/A 11/20/2012   Procedure: CARDIOVERSION;  Surgeon: Fay Records, MD;  Location: [facility] ENDOSCOPY;  Service: Cardiovascular;  Laterality: N/A;  . CARDIOVERSION N/A 07/18/2017   Procedure: CARDIOVERSION;  Surgeon: Thayer Headings, MD;  Location: [facility] ENDOSCOPY;  Service: Cardiovascular;  Laterality: N/A;  . CARDIOVERSION N/A 10/03/2017   Procedure: CARDIOVERSION;  Surgeon: Sanda Klein, MD;  Location: [facility] ENDOSCOPY;  Service: Cardiovascular;  Laterality: N/A;  . CARDIOVERSION N/A 01/07/2018   Procedure: CARDIOVERSION;  Surgeon:  Acie Fredrickson Wonda Cheng, MD;  Location: [place];  Service: Cardiovascular;  Laterality: N/A;  . CHOLECYSTECTOMY    . COLONOSCOPY W/ POLYPECTOMY     Dr Earlean Shawl  . CYSTOSCOPY N/A 02/06/2015   Procedure: CYSTOSCOPY;  Surgeon: Kathie Rhodes, MD;  [facility];  Service: Urology;  Laterality: N/A;  . CYSTOSCOPY W/ RETROGRADES Left 11/17/2017   Procedure: CYSTOSCOPY WITH RETROGRADE /PYELOGRAM/;  Surgeon: Kathie Rhodes, MD;  [facility];  Service: Urology;  Laterality: Left;  . CYSTOSCOPY WITH RETROGRADE PYELOGRAM, URETEROSCOPY AND STENT PLACEMENT Right 02/06/2015   Procedure: RETROGRADE PYELOGRAM, RIGHT URETEROSCOPY STENT PLACEMENT;  Surgeon: Kathie Rhodes, MD;  [facility];  Service: Urology;  Laterality: Right;  . CYSTOSCOPY WITH RETROGRADE PYELOGRAM, URETEROSCOPY AND STENT PLACEMENT Right 03/07/2017   Procedure: CYSTOSCOPY WITH RIGHT RETROGRADE PYELOGRAM,RIGHT  URETEROSCOPYLASER LITHOTRIPSY  AND STENT PLACEMENT AND STONE BASKETRY;  Surgeon: Kathie Rhodes, MD;  Location: [place];  Service: Urology;  Laterality: Right;  . EYE SURGERY     cataract surgery  . fatty mass removal  1999   pubic area  . HOLMIUM LASER APPLICATION N/A 9/38/1829   Procedure: HOLMIUM LASER APPLICATION;  Surgeon: Kathie Rhodes, MD;  [facility];  Service: Urology;  Laterality: N/A;  . HOLMIUM LASER APPLICATION Right 9/37/1696   Procedure: HOLMIUM LASER APPLICATION;  Surgeon: Kathie Rhodes, MD;  [facility];  Service: Urology;  Laterality: Right;  . HOLMIUM LASER APPLICATION Left 7/89/3810   Procedure: HOLMIUM LASER APPLICATION;  Surgeon: Kathie Rhodes, MD;  [facility];  Service: Urology;  Laterality: Left;  . IR FLUORO GUIDE PORT INSERTION RIGHT  02/24/2017  . IR NEPHROSTOMY PLACEMENT RIGHT  11/17/2017  . IR PATIENT EVAL TECH 0-60 MINS  03/11/2017  . IR REMOVAL TUN ACCESS W/ PORT W/O FL MOD SED  04/20/2018  . IR US GUIDE VASC ACCESS RIGHT  02/24/2017  . KNEE ARTHROSCOPY      bilateral  . LAPAROSCOPIC GASTRIC BANDING  07/10/2010  . LAPAROSCOPIC GASTRIC BANDING     Laparoscopic adjustable banding APS System with posterior hiatal hernia, 2 suture.  Marland Kitchen LAPAROTOMY     for ruptured ovary and ovarian artery   . NEPHROLITHOTOMY Right 11/17/2017   Procedure: NEPHROLITHOTOMY PERCUTANEOUS;  Surgeon: Kathie Rhodes, MD;  [facility];  Service: Urology;  Laterality: Right;  . PACEMAKER INSERTION  03/10/2013   MDT dual chamber PPM  . POCKET REVISION N/A 12/08/2013   Procedure: POCKET REVISION;  Surgeon: Deboraha Sprang, MD;  [facility] CATH LAB;  Service: Cardiovascular;  Laterality: N/A;  . PORTA CATH INSERTION    . REVERSE SHOULDER ARTHROPLASTY Right 05/14/2018   Procedure: RIGHT REVERSE SHOULDER ARTHROPLASTY;  Surgeon: Tania Ade, MD;  Location: [place];  Service: Orthopedics;  Laterality: Right;  . REVERSE SHOULDER REPLACEMENT Right 05/14/2018  . RIGHT HEART CATH N/A 07/21/2019   Procedure: RIGHT HEART CATH;  Surgeon: Larey Dresser, MD;  Location: [place] CV LAB;  Service: Cardiovascular;  Laterality: N/A;  . TEE WITH CARDIOVERSION  09/22/2017  . TEE WITHOUT CARDIOVERSION N/A 10/03/2017   Procedure: TRANSESOPHAGEAL ECHOCARDIOGRAM (TEE);  Surgeon: Sanda Klein, MD;  Location: [facility] ENDOSCOPY;  Service: Cardiovascular;  Laterality: N/A;  . TEE WITHOUT CARDIOVERSION N/A 08/04/2019   Procedure: TRANSESOPHAGEAL ECHOCARDIOGRAM (TEE);  Surgeon: Larey Dresser, MD;  Location: [facility] ENDOSCOPY;  Service: Cardiovascular;  Laterality: N/A;  . THYROIDECTOMY  1998   Dr Margot Chimes  . TONSILLECTOMY    . TOTAL KNEE ARTHROPLASTY  04/13/2012   Procedure: TOTAL KNEE ARTHROPLASTY;  Surgeon: Rudean Haskell, MD;  Location: [place];  Service: Orthopedics;  Laterality: Right;  Marland Kitchen VIDEO BRONCHOSCOPY WITH ENDOBRONCHIAL ULTRASOUND N/A 02/07/2017   Procedure: VIDEO BRONCHOSCOPY WITH ENDOBRONCHIAL ULTRASOUND;  Surgeon: Marshell Garfinkel, MD;  Location: [place];  Service: Pulmonary;  Laterality:  N/A;    Current Outpatient Medications  Medication Sig Dispense Refill  . acetaminophen (TYLENOL) 500 MG tablet Take 1,000 mg by mouth as needed for moderate pain or headache.     Marland Kitchen amiodarone (PACERONE) 200 MG tablet TAKE 1 TABLET BY MOUTH  DAILY 90 tablet 3  . apixaban (ELIQUIS) 5 MG TABS tablet Take 5 mg by mouth 2 (two) times daily.    Marland Kitchen levothyroxine (SYNTHROID) 137 MCG tablet Take 137 mcg by mouth See admin instructions. TAKE 1 TABLET (137 MCG) BY MOUTH DAILY, EXCEPT SATURDAYS    . Lifitegrast (XIIDRA) 5 % SOLN Apply 1 drop to eye at bedtime.     . Prenatal MV & Min w/FA-DHA (PRENATAL ADULT GUMMY/DHA/FA PO) Take 1 capsule by mouth 2 (two) times daily.    . rosuvastatin (CRESTOR) 10 MG tablet Take 1 tablet by mouth 2 (two) times a week. Takes on Monday and Thursday     Current Facility-Administered Medications  Medication Dose Route Frequency Provider Last Rate Last Admin  . sodium chloride flush (NS) 0.9 % injection 3 mL  3 mL Intravenous Q12H Deboraha Sprang, MD        Allergies  Allergen Reactions  . Dofetilide Other (See Comments)    Prolonged QT interval   . Rivaroxaban Other (See Comments)    Nose Bleed X 6 hrs ; packing in ER Nasal hemorrhage  . Epinephrine Other (See Comments)    Oral anesthetic Dental form only (liquid). Patient stated she will become "out of it" she can hear you but cannot respond in normal fashion. "not fully with it"  . Adhesive [Tape] Rash    Paper tape as well  . Ketamine Other (See Comments)    HALLUCINATIONS    Social History   Socioeconomic History  . Marital status: Single    Spouse name: Not on file  . Number of children: 0  . Years of education: Not on file  . Highest education level: Master's degree (e.g., MA, MS, MEng, MEd, MSW, MBA)  Occupational History  . 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 Smoking status: Never Smoker  . Smokeless tobacco: Never Used  Substance and  Sexual Activity  . Alcohol use: No    Comment: none since 1990  . Drug use: Never  . Sexual activity: Never    Birth control/protection: Surgical  Other Topics Concern  .  Not on file  Social History Narrative   SINGLE    NO CHILDREN   NEVER SMOKED   EXERCISE 3 X WK   RETIRED   NO CAFFEINE         Social Determinants of Health   Financial Resource Strain:   . Difficulty of Paying Living Expenses: Not on file  Food Insecurity:   . Worried About Charity fundraiser in the Last Year: Not on file  . Ran Out of Food in the Last Year: Not on file  Transportation Needs:   . Lack of Transportation (Medical): Not on file  . Lack of Transportation (Non-Medical): Not on file  Physical Activity:   . Days of Exercise per Week: Not on file  . Minutes of Exercise per Session: Not on file  Stress:   . Feeling of Stress : Not on file  Social Connections:   . Frequency of Communication with Friends and Family: Not on file  . Frequency of Social Gatherings with Friends and Family: Not on file  . Attends Religious Services: Not on file  . Active Member of Clubs or Organizations: Not on file  . Attends Archivist Meetings: Not on file  . Marital Status: Not on file  Intimate Partner Violence:   . Fear of Current or Ex-Partner: Not on file  . Emotionally Abused: Not on file  . Physically Abused: Not on file  . Sexually Abused: Not on file    Family History  Problem Relation Age of Onset  . Heart disease Father 37       MI _0   . Colon cancer Father        COLON  . Heart attack Father   . Other Mother        temporal arteritis   . Diabetes Sister   . Diabetes Brother   . Diabetes Paternal Aunt   . Diabetes Paternal Grandmother     ROS- All systems are reviewed and negative except as per the HPI above  Physical Exam: Vitals:   11/17/19 1440  BP: 112/80  Pulse: (!) 104  Weight: 103.1 kg  Height: _1  (1.676 m)   Wt Readings from Last 3 Encounters:    11/17/19 103.1 kg  11/16/19 103.2 kg  10/04/19 103 kg    Labs: Lab Results  Component Value Date   NA 141 07/27/2019   K 4.5 07/27/2019   CL 105 07/27/2019   CO2 23 07/27/2019   GLUCOSE 108 (H) 07/27/2019   BUN 24 (H) 07/27/2019   CREATININE 1.22 (H) 07/27/2019   CALCIUM 9.7 07/27/2019   PHOS 3.9 04/19/2013   MG 1.9 09/16/2017   Lab Results  Component Value Date   INR 1.07 05/14/2018   Lab Results  Component Value Date   CHOL 200 07/26/2013   HDL 51.80 07/26/2013   LDLCALC 116 (H) 07/26/2013   TRIG 162.0 (H) 07/26/2013     GEN- The patient is well appearing, alert and oriented x 3 today.   Head- normocephalic, atraumatic Eyes-  Sclera clear, conjunctiva pink Ears- hearing intact Oropharynx- clear Neck- supple, no JVP Lymph- no cervical lymphadenopathy Lungs- Clear to ausculation bilaterally, normal work of breathing Heart- Regular rate and rhythm, no murmurs, rubs or gallops, PMI not laterally displaced GI- soft, NT, ND, + BS Extremities- no clubbing, cyanosis, or edema MS- no significant deformity or atrophy Skin- no rash or lesion Psych- euthymic mood, full affect Neuro- strength and sensation are intact  EKG-atrial flutter  at 104 bpm    Assessment and Plan: 1. Atrial  Flutter with mostly controlled v rates per device clinic x 4 days Continue amiodarone 200 mg daily Discussed pursing cardioversion and pt would like to proceed  Earliest DCCV would be Monday 3/1, pt disappointed that she would not be able to have elective  done earlier  Pt was offered today to go to the ER for urgent cardioversion but she declined Continue eliquis 5 mg bid for a CHA2DS2VASc of 4   Cmet/cbc/tsh today/covid test scheduled   If condition worsens go to the ER for urgent cardioversion  Butch Penny C. Alexa Blish, [facility] [address] [place], [place] [address] [phone]

## 2019-11-18 ENCOUNTER — Encounter (HOSPITAL_COMMUNITY): Payer: Self-pay | Admitting: Emergency Medicine

## 2019-11-18 ENCOUNTER — Other Ambulatory Visit: Payer: Self-pay

## 2019-11-18 ENCOUNTER — Telehealth (HOSPITAL_COMMUNITY): Payer: Self-pay | Admitting: Endocrinology

## 2019-11-18 ENCOUNTER — Encounter (HOSPITAL_COMMUNITY): Payer: Medicare Other

## 2019-11-18 ENCOUNTER — Emergency Department (HOSPITAL_COMMUNITY)
Admission: EM | Admit: 2019-11-18 | Discharge: 2019-11-18 | Disposition: A | Discharge status: Home / Self Care | Payer: Medicare Other | Attending: Emergency Medicine | Admitting: Emergency Medicine

## 2019-11-18 ENCOUNTER — Inpatient Hospital Stay (HOSPITAL_COMMUNITY): Admission: RE | Admit: 2019-11-18 | Payer: Medicare Other | Source: Ambulatory Visit

## 2019-11-18 DIAGNOSIS — Z923 Personal history of irradiation: Secondary | ICD-10-CM | POA: Insufficient documentation

## 2019-11-18 DIAGNOSIS — Z85850 Personal history of malignant neoplasm of thyroid: Secondary | ICD-10-CM | POA: Insufficient documentation

## 2019-11-18 DIAGNOSIS — Z8572 Personal history of non-Hodgkin lymphomas: Secondary | ICD-10-CM | POA: Insufficient documentation

## 2019-11-18 DIAGNOSIS — I480 Paroxysmal atrial fibrillation: Secondary | ICD-10-CM | POA: Diagnosis not present

## 2019-11-18 DIAGNOSIS — E039 Hypothyroidism, unspecified: Secondary | ICD-10-CM | POA: Diagnosis not present

## 2019-11-18 DIAGNOSIS — Z96611 Presence of right artificial shoulder joint: Secondary | ICD-10-CM | POA: Diagnosis not present

## 2019-11-18 DIAGNOSIS — Z8673 Personal history of transient ischemic attack (TIA), and cerebral infarction without residual deficits: Secondary | ICD-10-CM | POA: Insufficient documentation

## 2019-11-18 DIAGNOSIS — Z96651 Presence of right artificial knee joint: Secondary | ICD-10-CM | POA: Insufficient documentation

## 2019-11-18 DIAGNOSIS — R0789 Other chest pain: Secondary | ICD-10-CM | POA: Insufficient documentation

## 2019-11-18 DIAGNOSIS — Z85118 Personal history of other malignant neoplasm of bronchus and lung: Secondary | ICD-10-CM | POA: Diagnosis not present

## 2019-11-18 DIAGNOSIS — R42 Dizziness and giddiness: Secondary | ICD-10-CM | POA: Diagnosis present

## 2019-11-18 DIAGNOSIS — Z950 Presence of cardiac pacemaker: Secondary | ICD-10-CM | POA: Diagnosis not present

## 2019-11-18 DIAGNOSIS — Z79899 Other long term (current) drug therapy: Secondary | ICD-10-CM | POA: Insufficient documentation

## 2019-11-18 DIAGNOSIS — Z7901 Long term (current) use of anticoagulants: Secondary | ICD-10-CM | POA: Insufficient documentation

## 2019-11-18 DIAGNOSIS — Z853 Personal history of malignant neoplasm of breast: Secondary | ICD-10-CM | POA: Insufficient documentation

## 2019-11-18 LAB — CBC
HCT: 41.9 % (ref 36.0–46.0)
Hemoglobin: 13.2 g/dL (ref 12.0–15.0)
MCH: 30.1 pg (ref 26.0–34.0)
MCHC: 31.5 g/dL (ref 30.0–36.0)
MCV: 95.4 fL (ref 80.0–100.0)
Platelets: 200 10*3/uL (ref 150–400)
RBC: 4.39 MIL/uL (ref 3.87–5.11)
RDW: 14.6 % (ref 11.5–15.5)
WBC: 5.4 10*3/uL (ref 4.0–10.5)
nRBC: 0 % (ref 0.0–0.2)

## 2019-11-18 LAB — BASIC METABOLIC PANEL
Anion gap: 10 (ref 5–15)
BUN: 22 mg/dL (ref 8–23)
CO2: 24 mmol/L (ref 22–32)
Calcium: 9.2 mg/dL (ref 8.9–10.3)
Chloride: 105 mmol/L (ref 98–111)
Creatinine, Ser: 1.03 mg/dL — ABNORMAL HIGH (ref 0.44–1.00)
GFR calc Af Amer: >60 mL/min (ref >60)
GFR calc non Af Amer: 53 mL/min — ABNORMAL LOW (ref >60)
Glucose, Bld: 97 mg/dL (ref 70–99)
Potassium: 4.8 mmol/L (ref 3.5–5.1)
Sodium: 139 mmol/L (ref 135–145)

## 2019-11-18 LAB — TROPONIN I (HIGH SENSITIVITY): Troponin I (High Sensitivity): 5 ng/L (ref <18)

## 2019-11-18 MED ORDER — SODIUM CHLORIDE 0.9% FLUSH: 3.0000 mL | Freq: Once | INTRAVENOUS | Status: DC

## 2019-11-18 MED ORDER — ETOMIDATE 2 MG/ML IV SOLN
0.1500 mg/kg | Freq: Once | INTRAVENOUS | Status: AC
  Administered 2019-11-18: 15.46 mg via INTRAVENOUS
  Filled 2019-11-18: qty 10

## 2019-11-18 NOTE — ED Provider Notes (Signed)
[facility] EMERGENCY DEPARTMENT Provider Note   CSN: [id number] Arrival date & time: 11/18/19  1319     History No chief complaint on file.   Kayla Harrison is a 74 y.o. female.  HPI She presents for evaluation of possible atrial fibrillation.  She states she has been feeling dizzy and having a funny feeling in her chest for several days, so yesterday sent a pacemaker interrogation to her cardiologist.  They contacted her and told her to "come to the ER when she cannot stand it anymore."  She describes having dizziness which is lightheaded feeling, not spinning feeling.  She denies fever, chills, cough, shortness of breath, nausea, vomiting, focal weakness or paresthesia.  There is been no chest pain.  She took her morning dose of Eliquis, this morning, and denies missing any doses.  There are no other known modifying factors.    Past Medical History:  Diagnosis Date   Anemia    Arthritis    osteoarthritis - knees and right shoulder   Atrial fibrillation ([place])    ablation- 2x's-- 1st time- [facility], 2nd event at [facility] in 2008. Convergent ablation at [facility] 6/14   Atrial fibrillation ([place])    Blood transfusion without reported diagnosis    Breast cancer ([place])    Dr Margot Chimes, total thyroidectomy- 1999- for cancer   Brucellosis 1964   Chronic bilateral pleural effusions    Colon polyp    Dr Earlean Shawl   Complete heart block ([place])    Complication of anesthesia    Ketamine produces LSD reaction, bright colored nightmarish experience    Dyslipidemia    Dyspnea    Endometriosis    Fibroids    H/O pleural effusion    s/p thoracentesis w 3252m withdrawn   Hepatitis    [place] ?hepatitis    History of dysphagia    due to radiation therapy   History of hiatal hernia    small noted on PET scan   History of kidney stones    Hx of thyroid cancer    Dr SForde Dandy  Hyperlipidemia    Hypertension     Hypothyroidism    Lung cancer, lower lobe ([place] 01/2017   radiation RX completed 03/04/17; will start chemo 6/27, pt unaware of lung cancer   Morbid obesity ([place]    Status post lap band surgery   Nephrolithiasis    Non Hodgkin's lymphoma ([facility]    on chemotherapy   Persistent atrial fibrillation ([place]    a. s/p PVI 2008 b. s/p convergent ablation 20998complicated by bradycardia requiring pacemaker implant   Personal history of radiation therapy    Presence of permanent cardiac pacemaker    Rotator cuff tear    Right   Sinus node dysfunction ([place]    Complicating convergent ablation 6/14   Stroke ([place]    [place]   SVC syndrome    with lung mass and non hodgkins lymphoma   Thyroid cancer ([place] 2000    Patient Active Problem List   Diagnosis Date Noted   Gait abnormality 07/10/2018   S/P reverse total shoulder arthroplasty, 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   Persistent atrial fibrillation ([place]    Bilateral ureteral calculi 11/17/2017   Atrial fibrillation with RVR ([place] 09/15/2017   Atrial flutter ([place] 07/17/2017   Port catheter in place 04/02/2017   Non-Hodgkin lymphoma, unspecified, intrathoracic lymph nodes ([place] 02/13/2017   Mediastinal mass 02/06/2017   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   Abnormal chest x-ray    Lung mass 02/03/2017   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   OSA (obstructive sleep apnea) 02/03/2015   History of renal calculi 11/16/2014   Renal calculi 11/16/2014   Family history of colon cancer 10/26/2014   Mechanical complication due to cardiac pacemaker pulse generator 11/06/2013   Dyspnea 05/12/2013   Hypothyroidism 04/21/2013   Pleural effusion 04/19/2013   Long term (current) use of anticoagulants 12/20/2010   EPIDERMOID CYST 08/22/2010   Chronic diastolic heart failure ([place]) 08/06/2010   SYNCOPE AND COLLAPSE 07/27/2010   OSTEOARTHRITIS, KNEE, RIGHT 03/15/2010   Morbid obesity ([place]) 12/07/2009     NONSPEC ELEVATION OF LEVELS OF TRANSAMINASE/LDH 09/08/2009   BREAST CANCER, HX OF 07/25/2009   COLONIC POLYPS, HX OF 07/25/2009   TUBULOVILLOUS ADENOMA, 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 2008 PVI b. 2014 convergent ablation   APPENDECTOMY     BONE MARROW BIOPSY  02/21/2017   BREAST LUMPECTOMY Left 2010   [place]     2015- negative   CARDIOVERSION  10/09/2012   Procedure: CARDIOVERSION;  Surgeon: Minus Breeding, MD;  Location: [place];  Service: Cardiovascular;  Laterality: N/A;   CARDIOVERSION  10/09/2012   Procedure: CARDIOVERSION;  Surgeon: Minus Breeding, MD;  Location: [facility] ENDOSCOPY;  Service: Cardiovascular;  Laterality: N/A;  [name] gave the ok to add pt to the add on , but we must check to find out if the can add pt on at [phone] ( [phone])   CARDIOVERSION N/A 11/20/2012   Procedure: CARDIOVERSION;  Surgeon: Fay Records, MD;  Location: [place];  Service: Cardiovascular;  Laterality: N/A;   CARDIOVERSION N/A 07/18/2017   Procedure: CARDIOVERSION;  Surgeon: Thayer Headings, MD;  Location: [facility] ENDOSCOPY;  Service: Cardiovascular;  Laterality: N/A;   CARDIOVERSION N/A 10/03/2017   Procedure: CARDIOVERSION;  Surgeon: Sanda Klein, MD;  Location: [place] ENDOSCOPY;  Service: Cardiovascular;  Laterality: N/A;   CARDIOVERSION N/A 01/07/2018   Procedure: CARDIOVERSION;  Surgeon: Nahser, Wonda Cheng, MD;  Location: [place];  Service: Cardiovascular;  Laterality: N/A;   CHOLECYSTECTOMY     COLONOSCOPY W/ POLYPECTOMY     Dr Earlean Shawl   CYSTOSCOPY N/A 02/06/2015   Procedure: CYSTOSCOPY;  Surgeon: Kathie Rhodes, MD;  Location: [facility];  Service: Urology;  Laterality: N/A;   CYSTOSCOPY W/ RETROGRADES Left 11/17/2017   Procedure: CYSTOSCOPY WITH  RETROGRADE /PYELOGRAM/;  Surgeon: Kathie Rhodes, MD;  Location: [facility];  Service: Urology;  Laterality: Left;   CYSTOSCOPY WITH RETROGRADE PYELOGRAM, URETEROSCOPY AND STENT PLACEMENT Right 02/06/2015   Procedure: RETROGRADE PYELOGRAM, RIGHT URETEROSCOPY STENT PLACEMENT;  Surgeon: Kathie Rhodes, MD;  [facility];  Service: Urology;  Laterality: Right;   CYSTOSCOPY WITH RETROGRADE PYELOGRAM, URETEROSCOPY AND STENT PLACEMENT Right 03/07/2017   Procedure: CYSTOSCOPY WITH RIGHT RETROGRADE PYELOGRAM,RIGHT  URETEROSCOPYLASER LITHOTRIPSY  AND STENT PLACEMENT AND STONE BASKETRY;  Surgeon: Kathie Rhodes, MD;  Location: [place];  Service: Urology;  Laterality: Right;   EYE SURGERY     cataract surgery   fatty mass removal  1999   pubic area   HOLMIUM LASER APPLICATION N/A 9/39/0300   Procedure: HOLMIUM LASER APPLICATION;  Surgeon: Kathie Rhodes, MD;  [facility];  Service: Urology;  Laterality: N/A;   HOLMIUM LASER APPLICATION Right 6/94/8546   Procedure: HOLMIUM LASER APPLICATION;  Surgeon: Kathie Rhodes, MD;  Location: [facility];  Service: Urology;  Laterality: Right;   HOLMIUM LASER APPLICATION Left 2/70/3500   Procedure: HOLMIUM LASER APPLICATION;  Surgeon: Kathie Rhodes, MD;  [facility];  Service: Urology;  Laterality: Left;   IR FLUORO GUIDE PORT INSERTION RIGHT  02/24/2017   IR NEPHROSTOMY PLACEMENT RIGHT  11/17/2017   IR PATIENT EVAL TECH 0-60 MINS  03/11/2017   IR REMOVAL TUN ACCESS W/ PORT W/O FL MOD SED  04/20/2018   IR US GUIDE VASC ACCESS RIGHT  02/24/2017   KNEE ARTHROSCOPY     bilateral   LAPAROSCOPIC GASTRIC BANDING  07/10/2010   LAPAROSCOPIC GASTRIC BANDING     Laparoscopic adjustable banding APS System with posterior hiatal hernia, 2 suture.   LAPAROTOMY     for ruptured ovary and ovarian artery    NEPHROLITHOTOMY Right 11/17/2017   Procedure: NEPHROLITHOTOMY PERCUTANEOUS;  Surgeon: Kathie Rhodes, MD;  [facility];   Service: Urology;  Laterality: Right;   PACEMAKER INSERTION  03/10/2013   MDT dual chamber PPM   POCKET REVISION N/A 12/08/2013   Procedure: POCKET REVISION;  Surgeon: Deboraha Sprang, MD;  [facility];  Service: Cardiovascular;  Laterality: N/A;   PORTA CATH INSERTION     REVERSE SHOULDER ARTHROPLASTY Right 05/14/2018   Procedure: RIGHT REVERSE SHOULDER ARTHROPLASTY;  Surgeon: Tania Ade, MD;  Location: [place];  Service: Orthopedics;  Laterality: Right;   REVERSE SHOULDER REPLACEMENT Right 05/14/2018   RIGHT HEART CATH N/A 07/21/2019   Procedure: RIGHT HEART CATH;  Surgeon: Larey Dresser, MD;  Location: [place] CV LAB;  Service: Cardiovascular;  Laterality: N/A;   TEE WITH CARDIOVERSION  09/22/2017   TEE WITHOUT CARDIOVERSION N/A 10/03/2017   Procedure: TRANSESOPHAGEAL ECHOCARDIOGRAM (TEE);  Surgeon: Sanda Klein, MD;  Location: [facility] ENDOSCOPY;  Service: Cardiovascular;  Laterality: N/A;   TEE WITHOUT CARDIOVERSION N/A 08/04/2019   Procedure: TRANSESOPHAGEAL ECHOCARDIOGRAM (TEE);  Surgeon: Larey Dresser, MD;  Location: [facility] ENDOSCOPY;  Service: Cardiovascular;  Laterality: N/A;   THYROIDECTOMY  1998   Dr Margot Chimes   TONSILLECTOMY     TOTAL KNEE ARTHROPLASTY  04/13/2012   Procedure: TOTAL KNEE ARTHROPLASTY;  Surgeon: Rudean Haskell, MD;  Location: [place];  Service: Orthopedics;  Laterality: Right;   VIDEO BRONCHOSCOPY WITH ENDOBRONCHIAL ULTRASOUND N/A 02/07/2017   Procedure: VIDEO BRONCHOSCOPY WITH ENDOBRONCHIAL ULTRASOUND;  Surgeon: Marshell Garfinkel, MD;  Location: [place];  Service: Pulmonary;  Laterality: N/A;     OB History   No obstetric history on file.     Family History  Problem Relation Age of Onset   Heart disease Father 67       MI '@autopsy'$    Colon cancer Father        COLON   Heart attack Father    Other Mother        temporal arteritis    Diabetes Sister    Diabetes Brother    Diabetes Paternal Aunt    Diabetes Paternal  Grandmother     Social History   Tobacco Use   Smoking status: Never Smoker   Smokeless tobacco: Never Used  Substance Use Topics   Alcohol use: No    Comment: none since 1990   Drug use: Never    Home Medications Prior to Admission medications   Medication Sig Start Date End Date Taking? Authorizing Provider  acetaminophen (  TYLENOL) 500 MG tablet Take 1,000 mg by mouth as needed for moderate pain or headache.     [provider]  amiodarone (PACERONE) 200 MG tablet TAKE 1 TABLET BY MOUTH  DAILY Patient taking differently: Take 200 mg by mouth daily.  10/25/19   Deboraha Sprang, MD  apixaban (ELIQUIS) 5 MG TABS tablet Take 5 mg by mouth 2 (two) times daily.    [provider]  levothyroxine (SYNTHROID) 137 MCG tablet Take 137 mcg by mouth See admin instructions. TAKE 1 TABLET (137 MCG) BY MOUTH DAILY, EXCEPT SATURDAYS    [provider]  Lifitegrast (XIIDRA) 5 % SOLN Place 1 drop into both eyes at bedtime.     [provider]  Prenatal MV & Min w/FA-DHA (PRENATAL ADULT GUMMY/DHA/FA PO) Take 1 capsule by mouth 2 (two) times daily.    [provider]  rosuvastatin (CRESTOR) 10 MG tablet Take 10 mg by mouth 2 (two) times a week. Takes on Monday and Thursday 06/30/18   [provider]    Allergies    Dofetilide, Rivaroxaban, Epinephrine, Adhesive [tape], and Ketamine  Review of Systems   Review of Systems  All other systems reviewed and are negative.   Physical Exam Updated Vital Signs BP (!) 163/96    Pulse 73    Temp 98.1 F (36.7 C) (Oral)    Resp 18    SpO2 100%   Physical Exam Vitals and nursing note reviewed.  Constitutional:      General: She is not in acute distress.    Appearance: She is well-developed. She is obese. She is not ill-appearing, toxic-appearing or diaphoretic.  HENT:     Head: Normocephalic and atraumatic.     Right Ear: External ear normal.     Left Ear: External ear normal.  Eyes:      Conjunctiva/sclera: Conjunctivae normal.     Pupils: Pupils are equal, round, and reactive to light.  Neck:     Trachea: Phonation normal.  Cardiovascular:     Rate and Rhythm: Regular rhythm. Tachycardia present.     Heart sounds: Normal heart sounds.     Comments: Pulse palpated at right radial artery/wrist, intermittent, not consistent with heart monitor spikes.  She drops about every third or fourth beat. Pulmonary:     Effort: Pulmonary effort is normal.     Breath sounds: Normal breath sounds.  Abdominal:     Palpations: Abdomen is soft.     Tenderness: There is no abdominal tenderness.  Musculoskeletal:        General: Normal range of motion.     Cervical back: Normal range of motion and neck supple.  Skin:    General: Skin is warm and dry.  Neurological:     Mental Status: She is alert and oriented to person, place, and time.     Cranial Nerves: No cranial nerve deficit.     Sensory: No sensory deficit.     Motor: No abnormal muscle tone.     Coordination: Coordination normal.  Psychiatric:        Mood and Affect: Mood normal.        Behavior: Behavior normal.        Thought Content: Thought content normal.        Judgment: Judgment normal.     ED Results / Procedures / Treatments   Labs (all labs ordered are listed, but only abnormal results are displayed) Labs Reviewed  BASIC METABOLIC PANEL - Abnormal; Notable  for the following components:      Result Value   Creatinine, Ser 1.03 (*)    GFR calc non Af Amer 53 (*)    All other components within normal limits  CBC  TROPONIN I (HIGH SENSITIVITY)  TROPONIN I (HIGH SENSITIVITY)    EKG EKG Interpretation  Date/Time:  Thursday November 18 2019 13:25:52 EST Ventricular Rate:  106 PR Interval:    QRS Duration: 90 QT Interval:  354 QTC Calculation: 470 R Axis:   -12 Text Interpretation: indeterminant rhythym Abnormal ECG Artifact Baseline wander Serial tracing suggested since last tracing no significant  change Confirmed by Daleen Bo [phone]) on 11/18/2019 1:56:04 PM   EKG Interpretation  Date/Time:  Thursday November 18 2019 16:19:20 EST Ventricular Rate:  75 PR Interval:    QRS Duration: 110 QT Interval:  413 QTC Calculation: 462 R Axis:   35 Text Interpretation: Sinus rhythm Prolonged PR interval Since last tracing of earlier today Now in sinus rthyhym Otherwise normal ECG Confirmed by Daleen Bo [phone]) on 11/18/2019 4:32:15 PM        Radiology No results found.  Procedures .Sedation  Date/Time: 11/18/2019 4:27 PM Performed by: Daleen Bo, MD Authorized by: Daleen Bo, MD   Universal protocol:    Immediately prior to procedure a time out was called: yes     Patient identity confirmation method:  Verbally with patient Indications:    Procedure performed:  Cardioversion Pre-sedation assessment:    Time since last food or drink:  8 hours   ASA classification: class 2 - patient with mild systemic disease     Mallampati score:  II - soft palate, uvula, fauces visible   Pre-sedation assessments completed and reviewed: airway patency, cardiovascular function, hydration status, mental status and respiratory function     Pre-sedation assessments completed and reviewed: pre-procedure nausea and vomiting status not reviewed and pre-procedure pain level not reviewed     Pre-sedation assessment completed:  11/18/2019 4:20 PM Immediate pre-procedure details:    Reassessment: Patient reassessed immediately prior to procedure     Reviewed: vital signs     Verified: bag valve mask available, emergency equipment available, intubation equipment available, IV patency confirmed and oxygen available   Procedure details (see MAR for exact dosages):    Preoxygenation:  Nasal cannula   Sedation:  Etomidate   Intended level of sedation: deep   Intra-procedure monitoring:  Blood pressure monitoring, cardiac monitor, continuous capnometry, continuous pulse oximetry, frequent LOC  assessments and frequent vital sign checks   Intra-procedure events: none     Total Provider sedation time (minutes):  12 Post-procedure details:    Post-sedation assessment completed:  11/18/2019 4:30 PM   Attendance: Constant attendance by certified staff until patient recovered     Recovery: Patient returned to pre-procedure baseline     Post-sedation assessments completed and reviewed: airway patency, cardiovascular function, hydration status, mental status, nausea/vomiting and respiratory function     Post-sedation assessments completed and reviewed: pain level not reviewed     Patient tolerance:  Tolerated well, no immediate complications .Cardioversion  Date/Time: 11/18/2019 4:30 PM Performed by: Daleen Bo, MD Authorized by: Daleen Bo, MD   Consent:    Consent obtained:  Written   Consent given by:  Patient   Risks discussed:  Induced arrhythmia and pain   Alternatives discussed:  No treatment Pre-procedure details:    Cardioversion basis:  Elective   Rhythm:  Atrial fibrillation   Electrode placement:  Anterior-posterior Patient sedated: Yes. Refer  to sedation procedure documentation for details of sedation.  Attempt one:    Cardioversion mode:  Synchronous   Waveform:  Biphasic   Shock (Joules):  120   Shock outcome:  Conversion to normal sinus rhythm Post-procedure details:    Patient status:  Awake   Patient tolerance of procedure:  Tolerated well, no immediate complications   (including critical care time)  Medications Ordered in ED Medications  sodium chloride flush (NS) 0.9 % injection 3 mL (has no administration in time range)  etomidate (AMIDATE) injection 15.46 mg (15.46 mg Intravenous Given 11/18/19 1616)    ED Course  I have reviewed the triage vital signs and the nursing notes.  Pertinent labs & imaging results that were available during my care of the patient were reviewed by me and considered in my medical decision making (see chart for  details).  Clinical Course as of Nov 18 1639  Thu Nov 18, 2019  1410 I discussed the case with Dr. Marlou Porch, cardiologist.  He request that I cardiovert the patient here in the ED.  Preparations will be made.   [EW]  1542 normal  Troponin I (High Sensitivity) [EW]  1543 Normal except creatinine slightly elevated, GFR slightly low  Basic metabolic panel(!) [EW]  1610 Normal  CBC [EW]    Clinical Course User Index [EW] Daleen Bo, MD   MDM Rules/Calculators/A&P                       Patient Vitals for the past 24 hrs:  BP Temp Temp src Pulse Resp SpO2  11/18/19 1630 (!) 163/96 -- -- 73 18 100 %  11/18/19 1628 -- -- -- 74 16 100 %  11/18/19 1627 (!) 161/102 -- -- 75 (!) 21 100 %  11/18/19 1626 -- -- -- 74 (!) 23 100 %  11/18/19 1625 -- -- -- 78 (!) 22 100 %  11/18/19 1624 (!) 155/139 -- -- 78 17 100 %  11/18/19 1623 -- -- -- 82 (!) 23 100 %  11/18/19 1622 -- -- -- 82 (!) 24 99 %  11/18/19 1621 (!) 162/105 -- -- 72 16 97 %  11/18/19 1620 -- -- -- 81 (!) 23 97 %  11/18/19 1619 -- -- -- 73 (!) 28 100 %  11/18/19 1618 (!) 157/106 -- -- (!) 102 13 100 %  11/18/19 1617 -- -- -- (!) 106 19 100 %  11/18/19 1616 -- -- -- (!) 114 17 100 %  11/18/19 1615 (!) 168/122 -- -- (!) 108 16 100 %  11/18/19 1614 -- -- -- (!) 104 17 100 %  11/18/19 1613 -- -- -- (!) 101 15 100 %  11/18/19 1612 -- 98.1 F (36.7 C) Oral -- -- --  11/18/19 1612 -- -- -- (!) 105 (!) 25 100 %  11/18/19 1611 -- -- -- (!) 102 19 100 %  11/18/19 1610 -- -- -- 100 13 100 %  11/18/19 1609 -- -- -- (!) 101 11 100 %  11/18/19 1608 -- -- -- 100 15 100 %  11/18/19 1607 -- -- -- 100 14 100 %  11/18/19 1445 (!) 145/96 -- -- (!) 102 16 100 %  11/18/19 1430 (!) 148/96 -- -- (!) 101 19 100 %  11/18/19 1355 (!) 156/99 -- -- (!) 107 20 100 %  11/18/19 1333 140/88 98.2 F (36.8 C) Oral (!) 106 15 100 %    4:33 PM Reevaluation with update and discussion. After initial  assessment and treatment, an updated evaluation reveals  patient comfortable post procedure, no additional complaints.  Baseline mental status recovered.  Findings discussed and questions answered. Daleen Bo   Medical Decision Making: Atrial fibrillation, recurrent, symptomatic, requiring cardioversion.  Patient currently anticoagulated with Eliquis, taking twice daily.  No evidence for Bolick instability or impending vascular collapse.  Patient stable for discharge after cardioversion.   JAZLYN TIPPENS was evaluated in Emergency Department on 11/18/2019 for the symptoms described in the history of present illness. She was evaluated in the context of the global COVID-19 pandemic, which necessitated consideration that the patient might be at risk for infection with the SARS-CoV-2 virus that causes COVID-19. Institutional protocols and algorithms that pertain to the evaluation of patients at risk for COVID-19 are in a state of rapid change based on information released by regulatory bodies including the CDC and federal and state organizations. These policies and algorithms were followed during the patient's care in the ED.   CRITICAL CARE- No Performed by: Daleen Bo   Nursing Notes Reviewed/ Care Coordinated Applicable Imaging Reviewed Interpretation of Laboratory Data incorporated into ED treatment  The patient appears reasonably screened and/or stabilized for discharge and I doubt any other medical condition or other [facility] requiring further screening, evaluation, or treatment in the ED at this time prior to discharge.  Plan: Home Medications-continue usual; Home Treatments-rest, fluids; return here if the recommended treatment, does not improve the symptoms; Recommended follow up-cardiology 1 week and as needed     Final Clinical Impression(s) / ED Diagnoses Final diagnoses:  Paroxysmal atrial fibrillation [facility])    Rx / DC Orders ED Discharge Orders    None       Daleen Bo, MD 11/18/19 [phone]

## 2019-11-18 NOTE — Discharge Instructions (Addendum)
Call your cardiologist to arrange for follow-up care and reassessment of your atrial fibrillation.  Continue to take your regular medications.

## 2019-11-18 NOTE — ED Notes (Signed)
Pt discharge and follow up education provided. Pt verbalizes understanding. Pt is alert and oriented x 4 with Aldrete score of 10 at discharge.

## 2019-11-18 NOTE — ED Notes (Signed)
ED Provider at bedside. 

## 2019-11-18 NOTE — ED Notes (Signed)
Pt refuses BP monitoring q89min. Pt removed BP cuff. Education provided. Pt declines SpO2 monitoring. Education provided. Pt is alert and oriented x 4.

## 2019-11-18 NOTE — ED Triage Notes (Signed)
Pt here from home with c/o aflutter / afib  With sob no chest pain , history of same , was due to have a cardioversion on march 1st

## 2019-11-18 NOTE — ED Provider Notes (Signed)
  Physical Exam  BP (!) 143/94 (BP Location: Right Arm)   Pulse 71   Temp 98.1 F (36.7 C) (Oral)   Resp 18   SpO2 100%   Physical Exam  ED Course/Procedures   Clinical Course as of Nov 17 1738  Thu Nov 18, 2019  1410 I discussed the case with Dr. Marlou Porch, cardiologist.  He request that I cardiovert the patient here in the ED.  Preparations will be made.   [EW]  1542 normal  Troponin I (High Sensitivity) [EW]  1543 Normal except creatinine slightly elevated, GFR slightly low  Basic metabolic panel(!) [EW]  1771 Normal  CBC [EW]    Clinical Course User Index [EW] Daleen Bo, MD    Procedures  MDM  Patient feels better.  Appears to be back at baseline.  Discharge home.      Davonna Belling, MD 11/18/19 1740

## 2019-11-19 ENCOUNTER — Encounter (HOSPITAL_COMMUNITY): Payer: Self-pay

## 2019-11-22 ENCOUNTER — Encounter (HOSPITAL_COMMUNITY): Admission: RE | Payer: Self-pay | Source: Home / Self Care

## 2019-11-22 ENCOUNTER — Ambulatory Visit (HOSPITAL_COMMUNITY): Admission: RE | Admit: 2019-11-22 | Payer: Medicare Other | Source: Home / Self Care | Admitting: Cardiology

## 2019-11-22 LAB — CUP PACEART INCLINIC DEVICE CHECK
Date Time Interrogation Session: 20210111130233
Implantable Lead Implant Date: 20140618
Implantable Lead Implant Date: 20140618
Implantable Lead Location: 753859
Implantable Lead Location: 753860
Implantable Pulse Generator Implant Date: 20140618

## 2019-11-22 SURGERY — CARDIOVERSION
Anesthesia: General

## 2019-11-23 ENCOUNTER — Encounter (HOSPITAL_COMMUNITY)
Admission: RE | Admit: 2019-11-23 | Discharge: 2019-11-23 | Disposition: A | Discharge status: Home / Self Care | Payer: Medicare Other | Source: Ambulatory Visit | Attending: Cardiology | Admitting: Cardiology

## 2019-11-23 ENCOUNTER — Other Ambulatory Visit: Payer: Self-pay

## 2019-11-23 DIAGNOSIS — I5032 Chronic diastolic (congestive) heart failure: Secondary | ICD-10-CM | POA: Diagnosis not present

## 2019-11-23 NOTE — Progress Notes (Signed)
Daily Session Note   Patient Details  Name: Kayla Harrison MRN: [id number] Date of Birth: 1946/06/18 Referring Provider:     Pulmonary Rehab Walk Test from 08/24/2019 in [place]  Referring Provider  Dr. Aundra Dubin      Encounter Date: 11/23/2019  Check In: Session Check In - 11/23/19 1310      Check-In   Supervising physician immediately available to respond to emergencies  [facility] immediately available    Physician(s)  Dr. Doristine Bosworth    Location  [facility]    Staff Present  Rosebud Poles, RN, Bjorn Loser, MS, Exercise Physiologist;Brittany Durene Fruits, BS, ACSM CEP, Exercise Physiologist    Virtual Visit  No    Medication changes reported      No    Fall or balance concerns reported     No    Tobacco Cessation  No Change    Warm-up and Cool-down  Performed as group-led instruction    Resistance Training Performed  Yes    VAD Patient?  No    PAD/SET Patient?  No      Pain Assessment   Currently in Pain?  No/denies    Multiple Pain Sites  No       Capillary Blood Glucose: No results found. However, due to the size of the patient record, not all encounters were searched. Please check Results Review for a complete set of results.    Social History   Tobacco Use  Smoking Status Never Smoker  Smokeless Tobacco Never Used    Goals Met:  Independence with exercise equipment Exercise tolerated well Strength training completed today  Goals Unmet:  Not Applicable  Comments: Service time is from 1300 to 1405    Dr. Fransico Him is Medical Director for Cardiac Rehab at [facility].

## 2019-11-25 ENCOUNTER — Encounter (HOSPITAL_COMMUNITY)
Admission: RE | Admit: 2019-11-25 | Discharge: 2019-11-25 | Disposition: A | Discharge status: Home / Self Care | Payer: Medicare Other | Source: Ambulatory Visit | Attending: Cardiology | Admitting: Cardiology

## 2019-11-25 ENCOUNTER — Other Ambulatory Visit: Payer: Self-pay

## 2019-11-25 DIAGNOSIS — I5032 Chronic diastolic (congestive) heart failure: Secondary | ICD-10-CM

## 2019-11-25 NOTE — Progress Notes (Signed)
Daily Session Note  Patient Details  Name: Kayla Harrison MRN: [id number] Date of Birth: 1946-02-17 Referring Provider:     Pulmonary Rehab Walk Test from 08/24/2019 in [place]  Referring Provider  Dr. Aundra Dubin      Encounter Date: 11/25/2019  Check In: Session Check In - 11/25/19 1405      Check-In   Supervising physician immediately available to respond to emergencies  [facility] immediately available    Physician(s)  Dr. Broadus John    Location  [facility]    Staff Present  Rosebud Poles, RN, Bjorn Loser, MS, Exercise Physiologist    Virtual Visit  No    Medication changes reported      No    Fall or balance concerns reported     No    Tobacco Cessation  No Change    Warm-up and Cool-down  Performed as group-led instruction    Resistance Training Performed  Yes    VAD Patient?  No    PAD/SET Patient?  No      Pain Assessment   Currently in Pain?  No/denies    Multiple Pain Sites  No       Capillary Blood Glucose: No results found. However, due to the size of the patient record, not all encounters were searched. Please check Results Review for a complete set of results.    Social History   Tobacco Use  Smoking Status Never Smoker  Smokeless Tobacco Never Used    Goals Met:  Independence with exercise equipment Exercise tolerated well Strength training completed today  Goals Unmet:  Not Applicable  Comments: Service time is from 1330 to 1426    Dr. Rush Farmer is Medical Director for Pulmonary Rehab at [facility].

## 2019-11-29 ENCOUNTER — Ambulatory Visit (HOSPITAL_BASED_OUTPATIENT_CLINIC_OR_DEPARTMENT_OTHER)
Admission: RE | Admit: 2019-11-29 | Discharge: 2019-11-29 | Disposition: A | Discharge status: Home / Self Care | Payer: Medicare Other | Source: Ambulatory Visit | Attending: Nurse Practitioner | Admitting: Nurse Practitioner

## 2019-11-29 ENCOUNTER — Other Ambulatory Visit: Payer: Self-pay

## 2019-11-29 ENCOUNTER — Encounter (HOSPITAL_COMMUNITY): Payer: Self-pay | Admitting: Nurse Practitioner

## 2019-11-29 VITALS — BP 132/76 | HR 72 | Ht 66.0 in | Wt 229.0 lb

## 2019-11-29 DIAGNOSIS — I4892 Unspecified atrial flutter: Secondary | ICD-10-CM | POA: Diagnosis not present

## 2019-11-29 DIAGNOSIS — D6869 Other thrombophilia: Secondary | ICD-10-CM | POA: Diagnosis not present

## 2019-11-29 NOTE — Progress Notes (Signed)
Primary Care Physician: [name], MD Referring Physician: Dr. [name] is a 74 y.o. female with a h/o paroxysmal afib that is in the afib clinic today for device clinic report that pt has been in atrial flutter for the last several days and this correlates with the pt's symptoms of feeling poorly x 4 days with elevated HR's. She continues on amiodarone daily and has not missed any eliquis 5 mg bid for a CHA2DS2VASc score of 4. She can not correlate any trigger with onset of atrial flutter.   F/u 11/29/19. When I saw pt last, she was scheduled for a cardioversion but could not tolerate the symptoms until the scheduled event so had an urgent cardioversion in the ER. In the clinic today, she feels much improved and continues in [place].   Today, she denies symptoms of palpitations, chest pain, shortness of breath, orthopnea, PND, lower extremity edema, dizziness, presyncope, syncope, or neurologic sequela. The patient is tolerating medications without difficulties and is otherwise without complaint today.   Past Medical History:  Diagnosis Date  . Anemia   . Arthritis    osteoarthritis - knees and right shoulder  . Atrial fibrillation [facility])    ablation- 2x's-- 1st time- [facility], 2nd event at [facility] in 2008. Convergent ablation at [facility] 6/14  . Atrial fibrillation ([place])   . Blood transfusion without reported diagnosis   . Breast cancer ([place])    Dr Margot Chimes, total thyroidectomy- 1999- for cancer  . 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 Chronic bilateral pleural effusions   . Colon polyp    Dr Earlean Shawl  . Complete heart block ([place])   . Complication of anesthesia    Ketamine produces LSD reaction, bright colored nightmarish experience   . Dyslipidemia   . Dyspnea   . Endometriosis   . Fibroids   . H/O pleural effusion    s/p thoracentesis w 3279m withdrawn  . Hepatitis    [place] ?hepatitis   . History of dysphagia    due to radiation therapy  .  History of hiatal hernia    small noted on PET scan  . History of kidney stones   . Hx of thyroid cancer    Dr SForde Dandy . Hyperlipidemia   . Hypertension   . Hypothyroidism   . Lung cancer, [place] lobe [place]   radiation RX completed 03/04/17; will start chemo 6/27, pt unaware of lung cancer  . Morbid obesity ([place]    Status post lap band surgery  . Nephrolithiasis   . Non Hodgkin's [facility] ([facility]    on chemotherapy  . Persistent atrial fibrillation ([place]    a. s/p PVI 2008 b. s/p convergent ablation 28937complicated by bradycardia requiring pacemaker implant  . Personal history of radiation therapy   . Presence of permanent cardiac pacemaker   . Rotator cuff tear    Right  . Sinus node dysfunction ([place]    Complicating convergent ablation 6/14  . St[place] ([facility]    [place]  . SVC syndrome    with lung mass and non hodgkins lymphoma  . Thyroid cancer ([place] 2000   Past Surgical History:  Procedure Laterality Date  . ABDOMINAL HYSTERECTOMY  1983  . afib ablation     a. 2008 PVI b. 2014 convergent ablation  . APPENDECTOMY    . BONE MARROW BIOPSY  02/21/2017  . BREAST LUMPECTOMY Left 2010  . bso  1998  .  CARDIAC CATHETERIZATION     2015- negative  . CARDIOVERSION  10/09/2012   Procedure: CARDIOVERSION;  Surgeon: Minus Breeding, MD;  Location: [place];  Service: Cardiovascular;  Laterality: N/A;  . CARDIOVERSION  10/09/2012   Procedure: CARDIOVERSION;  Surgeon: Minus Breeding, MD;  Location: [facility] ENDOSCOPY;  Service: Cardiovascular;  Laterality: N/A;  [name] gave the ok to add pt to the add on , but we must check to find out if the can add pt on at 1400 ( [phone])  . CARDIOVERSION N/A 11/20/2012   Procedure: CARDIOVERSION;  Surgeon: Fay Records, MD;  Location: [facility] ENDOSCOPY;  Service: Cardiovascular;  Laterality: N/A;  . CARDIOVERSION N/A 07/18/2017   Procedure: CARDIOVERSION;  Surgeon: Thayer Headings, MD;  Location: [facility] ENDOSCOPY;  Service: Cardiovascular;  Laterality: N/A;   . CARDIOVERSION N/A 10/03/2017   Procedure: CARDIOVERSION;  Surgeon: Sanda Klein, MD;  Location: [facility] ENDOSCOPY;  Service: Cardiovascular;  Laterality: N/A;  . CARDIOVERSION N/A 01/07/2018   Procedure: CARDIOVERSION;  Surgeon: Acie Fredrickson Wonda Cheng, MD;  Location: [place];  Service: Cardiovascular;  Laterality: N/A;  . CHOLECYSTECTOMY    . COLONOSCOPY W/ POLYPECTOMY     Dr Earlean Shawl  . CYSTOSCOPY N/A 02/06/2015   Procedure: CYSTOSCOPY;  Surgeon: Kathie Rhodes, MD;  [facility];  Service: Urology;  Laterality: N/A;  . CYSTOSCOPY W/ RETROGRADES Left 11/17/2017   Procedure: CYSTOSCOPY WITH RETROGRADE /PYELOGRAM/;  Surgeon: Kathie Rhodes, MD;  [facility]: [facility];  Service: Urology;  Laterality: Left;  . CYSTOSCOPY WITH RETROGRADE PYELOGRAM, URETEROSCOPY AND STENT PLACEMENT Right 02/06/2015   Procedure: RETROGRADE PYELOGRAM, RIGHT URETEROSCOPY STENT PLACEMENT;  Surgeon: Kathie Rhodes, MD;  [facility];  Service: Urology;  Laterality: Right;  . CYSTOSCOPY WITH RETROGRADE PYELOGRAM, URETEROSCOPY AND STENT PLACEMENT Right 03/07/2017   Procedure: CYSTOSCOPY WITH RIGHT RETROGRADE PYELOGRAM,RIGHT  URETEROSCOPYLASER LITHOTRIPSY  AND STENT PLACEMENT AND STONE BASKETRY;  Surgeon: Kathie Rhodes, MD;  Location: [place];  Service: Urology;  Laterality: Right;  . EYE SURGERY     cataract surgery  . fatty mass removal  1999   pubic area  . HOLMIUM LASER APPLICATION N/A 5/99/3570   Procedure: HOLMIUM LASER APPLICATION;  Surgeon: Kathie Rhodes, MD;  [facility];  Service: Urology;  Laterality: N/A;  . HOLMIUM LASER APPLICATION Right 1/77/9390   Procedure: HOLMIUM LASER APPLICATION;  Surgeon: Kathie Rhodes, MD;  Location: [facility];  Service: Urology;  Laterality: Right;  . HOLMIUM LASER APPLICATION Left 3/00/9233   Procedure: HOLMIUM LASER APPLICATION;  Surgeon: Kathie Rhodes, MD;  [facility];  Service: Urology;  Laterality: Left;  . IR FLUORO GUIDE PORT INSERTION  RIGHT  02/24/2017  . IR NEPHROSTOMY PLACEMENT RIGHT  11/17/2017  . IR PATIENT EVAL TECH 0-60 MINS  03/11/2017  . IR REMOVAL TUN ACCESS W/ PORT W/O FL MOD SED  04/20/2018  . IR US GUIDE VASC ACCESS RIGHT  02/24/2017  . KNEE ARTHROSCOPY     bilateral  . LAPAROSCOPIC GASTRIC BANDING  07/10/2010  . LAPAROSCOPIC GASTRIC BANDING     Laparoscopic adjustable banding APS System with posterior hiatal hernia, 2 suture.  Marland Kitchen LAPAROTOMY     for ruptured ovary and ovarian artery   . NEPHROLITHOTOMY Right 11/17/2017   Procedure: NEPHROLITHOTOMY PERCUTANEOUS;  Surgeon: Kathie Rhodes, MD;  [facility];  Service: Urology;  Laterality: Right;  . PACEMAKER INSERTION  03/10/2013   MDT dual chamber PPM  . POCKET REVISION N/A 12/08/2013   Procedure: POCKET REVISION;  Surgeon: Revonda Standard  Caryl Comes, MD;  [facility] CATH LAB;  Service: Cardiovascular;  Laterality: N/A;  . PORTA CATH INSERTION    . REVERSE SHOULDER ARTHROPLASTY Right 05/14/2018   Procedure: RIGHT REVERSE SHOULDER ARTHROPLASTY;  Surgeon: Tania Ade, MD;  Location: [place];  Service: Orthopedics;  Laterality: Right;  . REVERSE SHOULDER REPLACEMENT Right 05/14/2018  . RIGHT HEART CATH N/A 07/21/2019   Procedure: RIGHT HEART CATH;  Surgeon: Larey Dresser, MD;  Location: [place] CV LAB;  Service: Cardiovascular;  Laterality: N/A;  . TEE WITH CARDIOVERSION  09/22/2017  . TEE WITHOUT CARDIOVERSION N/A 10/03/2017   Procedure: TRANSESOPHAGEAL ECHOCARDIOGRAM (TEE);  Surgeon: Sanda Klein, MD;  Location: [facility] ENDOSCOPY;  Service: Cardiovascular;  Laterality: N/A;  . TEE WITHOUT CARDIOVERSION N/A 08/04/2019   Procedure: TRANSESOPHAGEAL ECHOCARDIOGRAM (TEE);  Surgeon: Larey Dresser, MD;  Location: [facility] ENDOSCOPY;  Service: Cardiovascular;  Laterality: N/A;  . THYROIDECTOMY  1998   Dr Margot Chimes  . TONSILLECTOMY    . TOTAL KNEE ARTHROPLASTY  04/13/2012   Procedure: TOTAL KNEE ARTHROPLASTY;  Surgeon: Rudean Haskell, MD;  Location: [place];  Service: Orthopedics;   Laterality: Right;  Marland Kitchen VIDEO BRONCHOSCOPY WITH ENDOBRONCHIAL ULTRASOUND N/A 02/07/2017   Procedure: VIDEO BRONCHOSCOPY WITH ENDOBRONCHIAL ULTRASOUND;  Surgeon: Marshell Garfinkel, MD;  Location: [place];  Service: Pulmonary;  Laterality: N/A;    Current Outpatient Medications  Medication Sig Dispense Refill  . acetaminophen (TYLENOL) 500 MG tablet Take 1,000 mg by mouth as needed for moderate pain or headache.     Marland Kitchen amiodarone (PACERONE) 200 MG tablet TAKE 1 TABLET BY MOUTH  DAILY (Patient taking differently: Take 200 mg by mouth daily. ) 90 tablet 3  . apixaban (ELIQUIS) 5 MG TABS tablet Take 5 mg by mouth 2 (two) times daily.    Marland Kitchen levothyroxine (SYNTHROID) 137 MCG tablet Take 137 mcg by mouth See admin instructions. TAKE 1 TABLET (137 MCG) BY MOUTH DAILY, EXCEPT SATURDAYS    . Lifitegrast (XIIDRA) 5 % SOLN Place 1 drop into both eyes at bedtime.     . Prenatal MV & Min w/FA-DHA (PRENATAL ADULT GUMMY/DHA/FA PO) Take 1 capsule by mouth 2 (two) times daily.    . rosuvastatin (CRESTOR) 10 MG tablet Take 10 mg by mouth 2 (two) times a week. Takes on Monday and Thursday     Current Facility-Administered Medications  Medication Dose Route Frequency Provider Last Rate Last Admin  . sodium chloride flush (NS) 0.9 % injection 3 mL  3 mL Intravenous Q12H Deboraha Sprang, MD        Allergies  Allergen Reactions  . Dofetilide Other (See Comments)    Prolonged QT interval   . Rivaroxaban Other (See Comments)    Nose Bleed X 6 hrs ; packing in ER Nasal hemorrhage  . Epinephrine Other (See Comments)    Oral anesthetic Dental form only (liquid). Patient stated she will become "out of it" she can hear you but cannot respond in normal fashion. "not fully with it"  . Adhesive [Tape] Rash    Paper tape as well  . Ketamine Other (See Comments)    HALLUCINATIONS    Social History   Socioeconomic History  . Marital status: Single    Spouse name: Not on file  . Number of children: 0  . Years of  education: Not on file  . Highest education level: Master's degree (e.g., MA, MS, MEng, MEd, MSW, MBA)  Occupational History  . 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 Smoking status: Never Smoker  . Smokeless tobacco: Never Used  Substance and Sexual Activity  . Alcohol use: No    Comment: none since 1990  . Drug use: Never  . Sexual activity: Never    Birth control/protection: Surgical  Other Topics Concern  . Not on file  Social History Narrative   SINGLE    NO CHILDREN   NEVER SMOKED   EXERCISE 3 X WK   RETIRED   NO CAFFEINE         Social Determinants of Health   Financial Resource Strain:   . Difficulty of Paying Living Expenses: Not on file  Food Insecurity:   . Worried About Charity fundraiser in the Last Year: Not on file  . Ran Out of Food in the Last Year: Not on file  Transportation Needs:   . Lack of Transportation (Medical): Not on file  . Lack of Transportation (Non-Medical): Not on file  Physical Activity:   . Days of Exercise per Week: Not on file  . Minutes of Exercise per Session: Not on file  Stress:   . Feeling of Stress : Not on file  Social Connections:   . Frequency of Communication with Friends and Family: Not on file  . Frequency of Social Gatherings with Friends and Family: Not on file  . Attends Religious Services: Not on file  . Active Member of Clubs or Organizations: Not on file  . Attends Archivist Meetings: Not on file  . Marital Status: Not on file  Intimate Partner Violence:   . Fear of Current or Ex-Partner: Not on file  . Emotionally Abused: Not on file  . Physically Abused: Not on file  . Sexually Abused: Not on file    Family History  Problem Relation Age of Onset  . Heart disease Father 19       MI _0   . Colon cancer Father        COLON  . Heart attack Father   . Other Mother        temporal arteritis   . Diabetes Sister   . Diabetes Brother     . Diabetes Paternal Aunt   . Diabetes Paternal Grandmother     ROS- All systems are reviewed and negative except as per the HPI above  Physical Exam: Vitals:   11/29/19 1432  BP: 132/76  Pulse: 72  Weight: 103.9 kg  Height: 5' 6" (1.676 m)   Wt Readings from Last 3 Encounters:  11/29/19 103.9 kg  11/17/19 103.1 kg  11/16/19 103.2 kg    Labs: Lab Results  Component Value Date   NA 139 11/18/2019   K 4.8 11/18/2019   CL 105 11/18/2019   CO2 24 11/18/2019   GLUCOSE 97 11/18/2019   BUN 22 11/18/2019   CREATININE 1.03 (H) 11/18/2019   CALCIUM 9.2 11/18/2019   PHOS 3.9 04/19/2013   MG 1.9 09/16/2017   Lab Results  Component Value Date   INR 1.07 05/14/2018   Lab Results  Component Value Date   CHOL 200 07/26/2013   HDL 51.80 07/26/2013   LDLCALC 116 (H) 07/26/2013   TRIG 162.0 (H) 07/26/2013     GEN- The patient is well appearing, alert and oriented x 3 today.   Head- normocephalic, atraumatic Eyes-  Sclera clear, conjunctiva pink Ears- hearing intact Oropharynx- clear Neck- supple, no JVP Lymph- no cervical lymphadenopathy Lungs- Clear to ausculation bilaterally, normal work  of breathing Heart- Regular rate and rhythm, no murmurs, rubs or gallops, PMI not laterally displaced GI- soft, NT, ND, + BS Extremities- no clubbing, cyanosis, or edema MS- no significant deformity or atrophy Skin- no rash or lesion Psych- euthymic mood, full affect Neuro- strength and sensation are intact  EKG- NSR/normal EKG at 72 bpm, pr int 144 ms, qrs int 92 ms, qtc 473 ms    Assessment and Plan: 1. Symptomatic  Atrial  Flutter with mostly controlled v rates per device clinic x 4 days Successful cardioversion 11/17/19 Continue amiodarone 200 mg daily Continue eliquis 5 mg bid for a CHA2DS2VASc of 4     F/u with Dr. Caryl Comes per recall in July as well with remote device checks    Kayla Harrison, [facility] [address] [place], [place] [address] [phone]

## 2019-11-30 ENCOUNTER — Encounter (HOSPITAL_COMMUNITY)
Admission: RE | Admit: 2019-11-30 | Discharge: 2019-11-30 | Disposition: A | Discharge status: Home / Self Care | Payer: Medicare Other | Source: Ambulatory Visit | Attending: Cardiology | Admitting: Cardiology

## 2019-11-30 VITALS — Wt 228.4 lb

## 2019-11-30 DIAGNOSIS — I5032 Chronic diastolic (congestive) heart failure: Secondary | ICD-10-CM

## 2019-11-30 NOTE — Progress Notes (Signed)
Daily Session Note  Patient Details  Name: Kayla Harrison MRN: [id number] Date of Birth: 1946/07/25 Referring Provider:     Pulmonary Rehab Walk Test from 08/24/2019 in [place]  Referring Provider  Dr. Aundra Dubin      Encounter Date: 11/30/2019  Check In: Session Check In - 11/30/19 1350      Check-In   Supervising physician immediately available to respond to emergencies  [facility] immediately available    Physician(s)  Dr. Broadus John    Location  [facility]    Staff Present  Rosebud Poles, RN, Bjorn Loser, MS, Exercise Physiologist;Lisa [name], RN    Virtual Visit  No    Medication changes reported      No    Fall or balance concerns reported     No    Tobacco Cessation  No Change    Warm-up and Cool-down  Performed as group-led instruction    Resistance Training Performed  Yes    VAD Patient?  No    PAD/SET Patient?  No      Pain Assessment   Currently in Pain?  No/denies       Capillary Blood Glucose: No results found. However, due to the size of the patient record, not all encounters were searched. Please check Results Review for a complete set of results.  Exercise Prescription Changes - 11/30/19 1400      Response to Exercise   Blood Pressure (Admit)  120/76    Blood Pressure (Exercise)  120/68    Blood Pressure (Exit)  118/84    Heart Rate (Admit)  81 bpm    Heart Rate (Exercise)  91 bpm    Heart Rate (Exit)  80 bpm    Oxygen Saturation (Admit)  96 %    Oxygen Saturation (Exercise)  96 %    Oxygen Saturation (Exit)  96 %    Rating of Perceived Exertion (Exercise)  14    Perceived Dyspnea (Exercise)  2    Duration  Continue with 30 min of aerobic exercise without signs/symptoms of physical distress.    Intensity  THRR unchanged      Progression   Progression  Continue to progress workloads to maintain intensity without signs/symptoms of physical distress.      Resistance Training   Training  Prescription  Yes    Weight  orange bands    Reps  10-15    Time  10 Minutes      Interval Training   Interval Training  No      Recumbant Bike   Level  1.5    Minutes  15    METs  1.9      NuStep   Level  3    SPM  80    Minutes  15    METs  1.8       Social History   Tobacco Use  Smoking Status Never Smoker  Smokeless Tobacco Never Used    Goals Met:  Independence with exercise equipment Exercise tolerated well Strength training completed today  Goals Unmet:  Not Applicable  Comments: Service time is from 1300 to 1358    Dr. Fransico Him is Medical Director for Cardiac Rehab at [facility].

## 2019-12-01 DIAGNOSIS — R55 Syncope and collapse: Secondary | ICD-10-CM | POA: Diagnosis not present

## 2019-12-01 DIAGNOSIS — I4891 Unspecified atrial fibrillation: Secondary | ICD-10-CM | POA: Diagnosis not present

## 2019-12-02 ENCOUNTER — Inpatient Hospital Stay (HOSPITAL_COMMUNITY)
Admission: EM | Admit: 2019-12-02 | Discharge: 2019-12-04 | DRG: 309 | Disposition: A | Discharge status: Home / Self Care | Payer: Medicare Other | Attending: Student | Admitting: Student

## 2019-12-02 ENCOUNTER — Telehealth: Payer: Self-pay

## 2019-12-02 ENCOUNTER — Telehealth (HOSPITAL_COMMUNITY): Payer: Self-pay | Admitting: Endocrinology

## 2019-12-02 ENCOUNTER — Inpatient Hospital Stay (HOSPITAL_COMMUNITY): Payer: Medicare Other

## 2019-12-02 ENCOUNTER — Other Ambulatory Visit: Payer: Self-pay

## 2019-12-02 ENCOUNTER — Encounter (HOSPITAL_COMMUNITY): Payer: Self-pay | Admitting: Emergency Medicine

## 2019-12-02 ENCOUNTER — Emergency Department (HOSPITAL_COMMUNITY): Payer: Medicare Other

## 2019-12-02 ENCOUNTER — Encounter (HOSPITAL_COMMUNITY): Payer: Medicare Other

## 2019-12-02 DIAGNOSIS — Z90710 Acquired absence of both cervix and uterus: Secondary | ICD-10-CM

## 2019-12-02 DIAGNOSIS — Z8673 Personal history of transient ischemic attack (TIA), and cerebral infarction without residual deficits: Secondary | ICD-10-CM

## 2019-12-02 DIAGNOSIS — S199XXA Unspecified injury of neck, initial encounter: Secondary | ICD-10-CM | POA: Diagnosis not present

## 2019-12-02 DIAGNOSIS — I4819 Other persistent atrial fibrillation: Principal | ICD-10-CM | POA: Diagnosis present

## 2019-12-02 DIAGNOSIS — M170 Bilateral primary osteoarthritis of knee: Secondary | ICD-10-CM | POA: Diagnosis present

## 2019-12-02 DIAGNOSIS — Z950 Presence of cardiac pacemaker: Secondary | ICD-10-CM | POA: Diagnosis not present

## 2019-12-02 DIAGNOSIS — I4891 Unspecified atrial fibrillation: Secondary | ICD-10-CM | POA: Diagnosis not present

## 2019-12-02 DIAGNOSIS — R296 Repeated falls: Secondary | ICD-10-CM | POA: Diagnosis present

## 2019-12-02 DIAGNOSIS — S06360A Traumatic hemorrhage of cerebrum, unspecified, without loss of consciousness, initial encounter: Secondary | ICD-10-CM | POA: Diagnosis not present

## 2019-12-02 DIAGNOSIS — I110 Hypertensive heart disease with heart failure: Secondary | ICD-10-CM | POA: Diagnosis present

## 2019-12-02 DIAGNOSIS — Z85118 Personal history of other malignant neoplasm of bronchus and lung: Secondary | ICD-10-CM | POA: Diagnosis not present

## 2019-12-02 DIAGNOSIS — N1330 Unspecified hydronephrosis: Secondary | ICD-10-CM | POA: Diagnosis not present

## 2019-12-02 DIAGNOSIS — J439 Emphysema, unspecified: Secondary | ICD-10-CM | POA: Diagnosis not present

## 2019-12-02 DIAGNOSIS — W010XXA Fall on same level from slipping, tripping and stumbling without subsequent striking against object, initial encounter: Secondary | ICD-10-CM | POA: Diagnosis present

## 2019-12-02 DIAGNOSIS — M810 Age-related osteoporosis without current pathological fracture: Secondary | ICD-10-CM | POA: Diagnosis present

## 2019-12-02 DIAGNOSIS — I10 Essential (primary) hypertension: Secondary | ICD-10-CM | POA: Diagnosis not present

## 2019-12-02 DIAGNOSIS — D649 Anemia, unspecified: Secondary | ICD-10-CM | POA: Diagnosis present

## 2019-12-02 DIAGNOSIS — S06341A Traumatic hemorrhage of right cerebrum with loss of consciousness of 30 minutes or less, initial encounter: Secondary | ICD-10-CM

## 2019-12-02 DIAGNOSIS — Z03818 Encounter for observation for suspected exposure to other biological agents ruled out: Secondary | ICD-10-CM | POA: Diagnosis not present

## 2019-12-02 DIAGNOSIS — C8590 Non-Hodgkin lymphoma, unspecified, unspecified site: Secondary | ICD-10-CM | POA: Diagnosis not present

## 2019-12-02 DIAGNOSIS — R9431 Abnormal electrocardiogram [ECG] [EKG]: Secondary | ICD-10-CM

## 2019-12-02 DIAGNOSIS — R937 Abnormal findings on diagnostic imaging of other parts of musculoskeletal system: Secondary | ICD-10-CM | POA: Diagnosis not present

## 2019-12-02 DIAGNOSIS — Z7901 Long term (current) use of anticoagulants: Secondary | ICD-10-CM | POA: Diagnosis not present

## 2019-12-02 DIAGNOSIS — Z79899 Other long term (current) drug therapy: Secondary | ICD-10-CM | POA: Diagnosis not present

## 2019-12-02 DIAGNOSIS — E039 Hypothyroidism, unspecified: Secondary | ICD-10-CM

## 2019-12-02 DIAGNOSIS — W19XXXA Unspecified fall, initial encounter: Secondary | ICD-10-CM | POA: Diagnosis not present

## 2019-12-02 DIAGNOSIS — E785 Hyperlipidemia, unspecified: Secondary | ICD-10-CM | POA: Diagnosis present

## 2019-12-02 DIAGNOSIS — S22029A Unspecified fracture of second thoracic vertebra, initial encounter for closed fracture: Secondary | ICD-10-CM | POA: Diagnosis present

## 2019-12-02 DIAGNOSIS — S0990XA Unspecified injury of head, initial encounter: Secondary | ICD-10-CM | POA: Diagnosis not present

## 2019-12-02 DIAGNOSIS — I5032 Chronic diastolic (congestive) heart failure: Secondary | ICD-10-CM | POA: Diagnosis present

## 2019-12-02 DIAGNOSIS — C9101 Acute lymphoblastic leukemia, in remission: Secondary | ICD-10-CM | POA: Diagnosis present

## 2019-12-02 DIAGNOSIS — I361 Nonrheumatic tricuspid (valve) insufficiency: Secondary | ICD-10-CM | POA: Diagnosis not present

## 2019-12-02 DIAGNOSIS — R930 Abnormal findings on diagnostic imaging of skull and head, not elsewhere classified: Secondary | ICD-10-CM | POA: Diagnosis not present

## 2019-12-02 DIAGNOSIS — I2510 Atherosclerotic heart disease of native coronary artery without angina pectoris: Secondary | ICD-10-CM | POA: Diagnosis present

## 2019-12-02 DIAGNOSIS — I4892 Unspecified atrial flutter: Secondary | ICD-10-CM | POA: Diagnosis present

## 2019-12-02 DIAGNOSIS — Z9221 Personal history of antineoplastic chemotherapy: Secondary | ICD-10-CM

## 2019-12-02 DIAGNOSIS — I340 Nonrheumatic mitral (valve) insufficiency: Secondary | ICD-10-CM | POA: Diagnosis not present

## 2019-12-02 DIAGNOSIS — M549 Dorsalgia, unspecified: Secondary | ICD-10-CM | POA: Diagnosis not present

## 2019-12-02 DIAGNOSIS — E890 Postprocedural hypothyroidism: Secondary | ICD-10-CM | POA: Diagnosis present

## 2019-12-02 DIAGNOSIS — Z923 Personal history of irradiation: Secondary | ICD-10-CM

## 2019-12-02 DIAGNOSIS — R590 Localized enlarged lymph nodes: Secondary | ICD-10-CM | POA: Diagnosis not present

## 2019-12-02 DIAGNOSIS — I483 Typical atrial flutter: Secondary | ICD-10-CM | POA: Diagnosis not present

## 2019-12-02 DIAGNOSIS — Z853 Personal history of malignant neoplasm of breast: Secondary | ICD-10-CM | POA: Diagnosis not present

## 2019-12-02 DIAGNOSIS — Z800 Family history of malignant neoplasm of digestive organs: Secondary | ICD-10-CM

## 2019-12-02 DIAGNOSIS — M899 Disorder of bone, unspecified: Secondary | ICD-10-CM | POA: Diagnosis not present

## 2019-12-02 DIAGNOSIS — Z85850 Personal history of malignant neoplasm of thyroid: Secondary | ICD-10-CM | POA: Diagnosis not present

## 2019-12-02 DIAGNOSIS — S299XXA Unspecified injury of thorax, initial encounter: Secondary | ICD-10-CM | POA: Diagnosis not present

## 2019-12-02 DIAGNOSIS — Z20822 Contact with and (suspected) exposure to covid-19: Secondary | ICD-10-CM | POA: Diagnosis present

## 2019-12-02 DIAGNOSIS — I619 Nontraumatic intracerebral hemorrhage, unspecified: Secondary | ICD-10-CM

## 2019-12-02 DIAGNOSIS — T1490XA Injury, unspecified, initial encounter: Secondary | ICD-10-CM

## 2019-12-02 DIAGNOSIS — R000 Tachycardia, unspecified: Secondary | ICD-10-CM | POA: Diagnosis not present

## 2019-12-02 DIAGNOSIS — R55 Syncope and collapse: Secondary | ICD-10-CM | POA: Diagnosis not present

## 2019-12-02 DIAGNOSIS — M542 Cervicalgia: Secondary | ICD-10-CM | POA: Diagnosis not present

## 2019-12-02 DIAGNOSIS — Z6836 Body mass index (BMI) 36.0-36.9, adult: Secondary | ICD-10-CM

## 2019-12-02 DIAGNOSIS — Z96611 Presence of right artificial shoulder joint: Secondary | ICD-10-CM | POA: Diagnosis present

## 2019-12-02 DIAGNOSIS — S22080A Wedge compression fracture of T11-T12 vertebra, initial encounter for closed fracture: Secondary | ICD-10-CM | POA: Diagnosis not present

## 2019-12-02 DIAGNOSIS — S3992XA Unspecified injury of lower back, initial encounter: Secondary | ICD-10-CM | POA: Diagnosis not present

## 2019-12-02 DIAGNOSIS — W2209XA Striking against other stationary object, initial encounter: Secondary | ICD-10-CM | POA: Diagnosis present

## 2019-12-02 DIAGNOSIS — R52 Pain, unspecified: Secondary | ICD-10-CM | POA: Diagnosis not present

## 2019-12-02 DIAGNOSIS — Z8572 Personal history of non-Hodgkin lymphomas: Secondary | ICD-10-CM | POA: Diagnosis not present

## 2019-12-02 DIAGNOSIS — S069X0A Unspecified intracranial injury without loss of consciousness, initial encounter: Secondary | ICD-10-CM | POA: Diagnosis not present

## 2019-12-02 DIAGNOSIS — K639 Disease of intestine, unspecified: Secondary | ICD-10-CM | POA: Diagnosis not present

## 2019-12-02 LAB — COMPREHENSIVE METABOLIC PANEL
ALT: 19 U/L (ref 0–44)
AST: 18 U/L (ref 15–41)
Albumin: 3 g/dL — ABNORMAL LOW (ref 3.5–5.0)
Alkaline Phosphatase: 80 U/L (ref 38–126)
Anion gap: 11 (ref 5–15)
BUN: 22 mg/dL (ref 8–23)
CO2: 20 mmol/L — ABNORMAL LOW (ref 22–32)
Calcium: 8.5 mg/dL — ABNORMAL LOW (ref 8.9–10.3)
Chloride: 110 mmol/L (ref 98–111)
Creatinine, Ser: 1.07 mg/dL — ABNORMAL HIGH (ref 0.44–1.00)
GFR calc Af Amer: 59 mL/min — ABNORMAL LOW (ref >60)
GFR calc non Af Amer: 51 mL/min — ABNORMAL LOW (ref >60)
Glucose, Bld: 108 mg/dL — ABNORMAL HIGH (ref 70–99)
Potassium: 4.3 mmol/L (ref 3.5–5.1)
Sodium: 141 mmol/L (ref 135–145)
Total Bilirubin: 0.5 mg/dL (ref 0.3–1.2)
Total Protein: 5.8 g/dL — ABNORMAL LOW (ref 6.5–8.1)

## 2019-12-02 LAB — URINALYSIS, ROUTINE W REFLEX MICROSCOPIC
Bilirubin Urine: NEGATIVE
Glucose, UA: NEGATIVE mg/dL
Ketones, ur: NEGATIVE mg/dL
Nitrite: NEGATIVE
Protein, ur: NEGATIVE mg/dL
Specific Gravity, Urine: 1.012 (ref 1.005–1.030)
pH: 7 (ref 5.0–8.0)

## 2019-12-02 LAB — CBC
HCT: 38.5 % (ref 36.0–46.0)
Hemoglobin: 11.8 g/dL — ABNORMAL LOW (ref 12.0–15.0)
MCH: 29.7 pg (ref 26.0–34.0)
MCHC: 30.6 g/dL (ref 30.0–36.0)
MCV: 97 fL (ref 80.0–100.0)
Platelets: 167 10*3/uL (ref 150–400)
RBC: 3.97 MIL/uL (ref 3.87–5.11)
RDW: 14.4 % (ref 11.5–15.5)
WBC: 5.4 10*3/uL (ref 4.0–10.5)
nRBC: 0 % (ref 0.0–0.2)

## 2019-12-02 LAB — SARS CORONAVIRUS 2 (TAT 6-24 HRS): SARS Coronavirus 2: NEGATIVE

## 2019-12-02 LAB — I-STAT CHEM 8, ED
BUN: 22 mg/dL (ref 8–23)
Calcium, Ion: 0.93 mmol/L — ABNORMAL LOW (ref 1.15–1.40)
Chloride: 112 mmol/L — ABNORMAL HIGH (ref 98–111)
Creatinine, Ser: 1 mg/dL (ref 0.44–1.00)
Glucose, Bld: 106 mg/dL — ABNORMAL HIGH (ref 70–99)
HCT: 37 % (ref 36.0–46.0)
Hemoglobin: 12.6 g/dL (ref 12.0–15.0)
Potassium: 4.1 mmol/L (ref 3.5–5.1)
Sodium: 140 mmol/L (ref 135–145)
TCO2: 22 mmol/L (ref 22–32)

## 2019-12-02 LAB — TROPONIN I (HIGH SENSITIVITY)
Troponin I (High Sensitivity): 5 ng/L (ref <18)
Troponin I (High Sensitivity): 8 ng/L (ref <18)

## 2019-12-02 LAB — MAGNESIUM: Magnesium: 2.1 mg/dL (ref 1.7–2.4)

## 2019-12-02 LAB — PROTIME-INR
INR: 1.5 — ABNORMAL HIGH (ref 0.8–1.2)
Prothrombin Time: 17.7 seconds — ABNORMAL HIGH (ref 11.4–15.2)

## 2019-12-02 LAB — ETHANOL: Alcohol, Ethyl (B): <10 mg/dL (ref <10)

## 2019-12-02 MED ORDER — ADULT MULTIVITAMIN W/MINERALS CH
1.0000 | ORAL_TABLET | Freq: Every day | ORAL | Status: DC
  Administered 2019-12-03 – 2019-12-04 (×2): 1 via ORAL
  Filled 2019-12-02 (×4): qty 1

## 2019-12-02 MED ORDER — ROSUVASTATIN CALCIUM 5 MG PO TABS: 10.0000 mg | ORAL_TABLET | ORAL | Status: DC

## 2019-12-02 MED ORDER — ACETAMINOPHEN 650 MG RE SUPP: 650.0000 mg | Freq: Four times a day (QID) | RECTAL | Status: DC | PRN

## 2019-12-02 MED ORDER — LABETALOL HCL 5 MG/ML IV SOLN: 5.0000 mg | INTRAVENOUS | Status: DC | PRN

## 2019-12-02 MED ORDER — FENTANYL CITRATE (PF) 100 MCG/2ML IJ SOLN
50.0000 ug | INTRAMUSCULAR | Status: AC | PRN
  Administered 2019-12-03 (×2): 50 ug via INTRAVENOUS
  Filled 2019-12-02 (×2): qty 2

## 2019-12-02 MED ORDER — FENTANYL CITRATE (PF) 100 MCG/2ML IJ SOLN
50.0000 ug | Freq: Once | INTRAMUSCULAR | Status: AC
  Administered 2019-12-02: 50 ug via INTRAVENOUS
  Filled 2019-12-02: qty 2

## 2019-12-02 MED ORDER — LEVOTHYROXINE SODIUM 25 MCG PO TABS
137.0000 ug | ORAL_TABLET | ORAL | Status: DC
  Administered 2019-12-03: 137 ug via ORAL
  Filled 2019-12-02 (×2): qty 1

## 2019-12-02 MED ORDER — HYDRALAZINE HCL 20 MG/ML IJ SOLN: 5.0000 mg | INTRAMUSCULAR | Status: DC | PRN

## 2019-12-02 MED ORDER — LIFITEGRAST 5 % OP SOLN: 1.0000 [drp] | Freq: Every evening | OPHTHALMIC | Status: DC

## 2019-12-02 MED ORDER — AMIODARONE HCL 200 MG PO TABS
200.0000 mg | ORAL_TABLET | Freq: Every day | ORAL | Status: DC
  Administered 2019-12-03: 200 mg via ORAL
  Filled 2019-12-02: qty 1

## 2019-12-02 MED ORDER — ACETAMINOPHEN 325 MG PO TABS
650.0000 mg | ORAL_TABLET | Freq: Four times a day (QID) | ORAL | Status: DC | PRN
  Administered 2019-12-02 – 2019-12-04 (×2): 650 mg via ORAL
  Filled 2019-12-02 (×2): qty 2

## 2019-12-02 MED ORDER — IOHEXOL 300 MG/ML  SOLN
100.0000 mL | Freq: Once | INTRAMUSCULAR | Status: AC | PRN
  Administered 2019-12-02: 100 mL via INTRAVENOUS

## 2019-12-02 NOTE — Progress Notes (Signed)
This patient has an MR CONDITIONAL pacemaker and leads. This means a rep from [employer] has to be present to put the pacer in a safe mode before scanning can occur.  [employer] does not currently work outside of normal business hours and they require a cardiology order with the rate and mode to be set per the Cardiologist.The number to page the REP at [employer]: [phone].    Ordering physician aware.

## 2019-12-02 NOTE — ED Provider Notes (Signed)
[facility] EMERGENCY DEPARTMENT Provider Note   CSN: [id number] Arrival date & time: 12/02/19  1724     History Chief Complaint  Patient presents with  . Level 2 Fall    Kayla Harrison is a 74 y.o. female.  HPI Pt is a 74 yo F with a PMH of SVC syndrome, atrial fibrillation (on Eliquis), OSA, Non Hodgkin lymphoma, HLD, remote hx of breast and thyroid cancer presenting to the ED today as a level 2 trauma following a fall. Pt says that she was walking and her legs buckled underneath her. She fell hitting her head. She denies LOC. She denies experiencing any lightheadedness, CP or SOB prior to the fall. She does say that she "passed out" yesterday and did not seek medical attention. On arrival, she is complaining of pain in the back of her head.     History reviewed. No pertinent past medical history.  There are no problems to display for this patient.      OB History   No obstetric history on file.     History reviewed. No pertinent family history.  Social History   Tobacco Use  . Smoking status: Not on file  Substance Use Topics  . Alcohol use: Not on file  . Drug use: Not on file    Home Medications Prior to Admission medications   Not on File    Allergies    Patient has no allergy information on record.  Review of Systems   Review of Systems  Constitutional: Negative for chills and fever.  HENT: Negative for ear pain and sore throat.   Eyes: Negative for pain and visual disturbance.  Respiratory: Negative for cough and shortness of breath.   Cardiovascular: Negative for chest pain and leg swelling.  Gastrointestinal: Negative for abdominal pain, diarrhea, nausea and vomiting.  Genitourinary: Negative for dysuria and hematuria.  Musculoskeletal: Positive for back pain and neck pain. Negative for arthralgias.  Skin: Negative for rash and wound.  Neurological: Positive for syncope (yesterday) and headaches. Negative for seizures and  light-headedness.  Psychiatric/Behavioral: Negative for agitation.  All other systems reviewed and are negative.   Physical Exam Updated Vital Signs BP (!) 176/121   Pulse (!) 108   Temp (!) 96.9 F (36.1 C) (Temporal)   Resp (!) 26   SpO2 97%   Physical Exam Vitals and nursing note reviewed.  Constitutional:      General: She is not in acute distress.    Appearance: Normal appearance. She is well-developed. She is obese. She is not ill-appearing.  HENT:     Head: Normocephalic and atraumatic.     Right Ear: External ear normal.     Left Ear: External ear normal.     Nose: Nose normal. No congestion or rhinorrhea.     Mouth/Throat:     Mouth: Mucous membranes are moist.     Pharynx: Oropharynx is clear.  Eyes:     Extraocular Movements: Extraocular movements intact.     Pupils: Pupils are equal, round, and reactive to light.  Neck:     Comments: Ccollar in place. Midline Cspine TTP. Cardiovascular:     Rate and Rhythm: Normal rate. Rhythm irregular.     Pulses: Normal pulses.     Heart sounds: Normal heart sounds.  Pulmonary:     Effort: Pulmonary effort is normal.     Comments: B/l breath sounds. Abdominal:     General: There is no distension.  Palpations: Abdomen is soft.     Tenderness: There is no abdominal tenderness. There is no guarding or rebound.  Musculoskeletal:        General: No deformity or signs of injury. Normal range of motion.     Comments: Midline T and L spine TTP.  Skin:    General: Skin is warm and dry.     Capillary Refill: Capillary refill takes less than 2 seconds.  Neurological:     General: No focal deficit present.     Mental Status: She is alert and oriented to person, place, and time. Mental status is at baseline.     Cranial Nerves: No cranial nerve deficit.     Sensory: No sensory deficit.     Motor: No weakness.  Psychiatric:        Mood and Affect: Mood normal.     ED Results / Procedures / Treatments   Labs (all labs  ordered are listed, but only abnormal results are displayed) Labs Reviewed  I-STAT CHEM 8, ED - Abnormal; Notable for the following components:      Result Value   Chloride 112 (*)    Glucose, Bld 106 (*)    Calcium, Ion 0.93 (*)    All other components within normal limits  COMPREHENSIVE METABOLIC PANEL  CBC  ETHANOL  URINALYSIS, ROUTINE W REFLEX MICROSCOPIC  PROTIME-INR  TROPONIN I (HIGH SENSITIVITY)    EKG EKG Interpretation  Date/Time:  Thursday December 02 2019 17:37:13 EST Ventricular Rate:  112 PR Interval:    QRS Duration: 104 QT Interval:  373 QTC Calculation: 510 R Axis:   44 Text Interpretation: Atrial fibrillation Prolonged QT interval No old tracing to compare Confirmed by Deno Etienne [phone]) on 12/02/2019 5:38:23 PM   Radiology DG Chest Port 1 View  Result Date: 12/02/2019 CLINICAL DATA:  Trauma. Fall from standing.  On anticoagulation. EXAM: PORTABLE CHEST 1 VIEW COMPARISON:  Radiograph 04/02/2019 FINDINGS: Borderline cardiomegaly. Post left atrial clipping. Mediastinal contours are unchanged. Left-sided pacemaker with leads unchanged in position. No focal airspace disease. No pneumothorax. No large pleural effusion. No pulmonary edema. Reverse right shoulder arthroplasty. No acute osseous abnormalities are seen. Surgical clips at the thoracic inlet. IMPRESSION: No evidence of acute traumatic injury to the thorax. Electronically Signed   By: Keith Rake M.D.   On: 12/02/2019 18:10    Procedures Procedures (including critical care time)  Medications Ordered in ED Medications  amiodarone (PACERONE) tablet 200 mg (has no administration in time range)  rosuvastatin (CRESTOR) tablet 10 mg (has no administration in time range)  levothyroxine (SYNTHROID) tablet 137 mcg (has no administration in time range)  multivitamin with minerals tablet 1 tablet (has no administration in time range)  acetaminophen (TYLENOL) tablet 650 mg (650 mg Oral Given 12/02/19 2351)    Or    acetaminophen (TYLENOL) suppository 650 mg ( Rectal See Alternative 12/02/19 2351)  labetalol (NORMODYNE) injection 5 mg (has no administration in time range)  fentaNYL (SUBLIMAZE) injection 50 mcg (has no administration in time range)  fentaNYL (SUBLIMAZE) injection 50 mcg (50 mcg Intravenous Given 12/02/19 1818)  fentaNYL (SUBLIMAZE) injection 50 mcg (50 mcg Intravenous Given 12/02/19 1958)  iohexol (OMNIPAQUE) 300 MG/ML solution 100 mL (100 mLs Intravenous Contrast Given 12/02/19 2308)    ED Course  I have reviewed the triage vital signs and the nursing notes.  Pertinent labs & imaging results that were available during my care of the patient were reviewed by me and considered in my  medical decision making (see chart for details).    MDM Rules/Calculators/A&P                     Pt is a 74 yo F with a PMH of SVC syndrome, atrial fibrillation (on Eliquis), OSA, Non Hodgkin lymphoma, HLD, remote hx of breast and thyroid cancer presenting to the ED today as a level 2 trauma following a fall. On arrival, ABC's intact. GCS 15. BP 190/100, HR 122, RR 20, SPO2 100% on RA. Afebrile. On exam, pt has midline cervical, thoracic and lumbar spine TTP. Bedside CXR showed no obvious PTX. Pt stable for CT scanner. Will obtain CTH, CT C/T/L spine. Pt given fentanyl for pain control.  CT head showed "7 mm hyperdense focus in the right occipital lobe is concerning for an acute intraparenchymal hemorrhage. An underlying hemorrhagic metastatic lesion is a differential consideration." CT Cspine concerning for lytic lesions in T1 and C3 vertebra, likely metastatic in nature. CT Tspine with "possibly acute SEP intravertebral disc herniation at T12." NSU consulted for IPH who recommended MRI and SBP goal <160. MRI will likely wait until AM due to pt's pacemaker. Pt's BP and HR improved following pain control.  Pt disclosed frequent falls with recent syncopal event, which is concerning. Syncopal events may be explained  by metastatic brain cancer. Obtained EKG which showed atrial fibrillation consistent with baseline. Collected serial troponins which were 5 and 8, consecutively. Pacemaker interrogated which showed multiple episodes of atrial tachycardia and 3 runs of NSVT (b/w 1-4 seconds) with the most recent being on 3/9. Discussed these results with cardiology fellow on call who had low suspicion that these findings were causing syncope and likely more reflective of afib.   At this time, pt admitted to Internal Medicine Service for further w/u of syncope and likely new metastatic disease. Pt in agreement with plan. No further intervention required while in the ED. Pt stable at time of admission.   Pt assessed and evaluated with Dr. Tyrone Nine.  Nadeen Landau, MD    Final Clinical Impression(s) / ED Diagnoses Final diagnoses:  Trauma  Trauma    Rx / DC Orders ED Discharge Orders    None       Nadeen Landau, MD 12/03/19 [place], [place], DO 12/03/19 [phone]

## 2019-12-02 NOTE — Telephone Encounter (Signed)
Spoke with pt regarding mychart message sent.  Requested pt send manual transmission from PM device.   Pt is in AF.  I advised her it might be best to f/u with [facility] since she is symptomatic.  Pt declined indicating she would like Dr. Caryl Comes to provide recommendation.  Forwarding info to [place], RN for Dr. Caryl Comes

## 2019-12-02 NOTE — H&P (Signed)
History and Physical    Kayla Harrison GGY:[id number] DOB: 09-Feb-1946 DOA: 12/02/2019  PCP: [name], MD Patient coming from: Home  Chief Complaint: Fall  HPI: Kayla Harrison is a 74 y.o. female with medical history significant of recurrent A. fib/flutter status post PPM on Eliquis, history of non-Hodgkin's lymphoma status post chemotherapy in 2018, history of SVC syndrome, history of thyroid cancer status post thyroidectomy, hypertension, hyperlipidemia, hepatitis presenting to the ED after a fall.  Patient states she has A. fib and was cardioverted 2 weeks ago.  For the past 2 days now she is back in A. fib.  Yesterday while taking a shower all of a sudden she felt very lightheaded and next thing remembers waking up on the shower floor.  She then could not get up and had to drag her body out of the bathroom.  She called EMS at that time but it seems was not brought into the ED to be evaluated.  Today while walking her legs gave out and she fell and hit her head.  She did not lose consciousness today.  No associated chest pain or shortness of breath.  No fevers, chills, or recent illness.  No nausea, vomiting, diarrhea, abdominal pain, or dysuria.  ED Course: Afebrile.  In A. fib with RVR with heart rate in the 120s on arrival.  Blood pressure elevated on arrival. Labs showing no leukocytosis. Hemoglobin 11.8. Bicarb 20. Creatinine 1.0. Blood ethanol level undetectable. High-sensitivity troponin negative. SARS-CoV-2 PCR test pending. UA with small amount of leukocytes, 6-10 WBCs, and rare bacteria. Chest x-ray showing no evidence of acute traumatic injury to the thorax.  CT head IMPRESSION: A small 7 mm hyperdense focus in the right occipital lobe is concerning for an acute intraparenchymal hemorrhage. An underlying hemorrhagic metastatic lesion is a differential consideration. Surveillance CT brain without and with intravenous contrast recommended in 2-3 months for further  evaluation given the cardiac pacemaker. No acute skull fracture. Chronic microvascular ischemic changes in both cerebral hemispheres and left lacunar infarction.  CT C-spine IMPRESSION: 1. No acute fracture or malalignment of the cervical spine. 2. Two suspicious small lytic lesions in the T1 and C3 vertebra, possibly osteolytic metastasis to the skeleton. 3. Centrilobular emphysema with bilateral upper lobe bronchitis, small right pleural effusion, and patchy pulmonary disease, possibly infectious. 4. Aortic atherosclerosis (ICD10-I70.0) and Emphysema (ICD10-J43.9). 5. Advanced cervical degenerative disc disease. 6. Diffuse bone demineralization.  CT thoracic spine IMPRESSION: Possibly acute superior endplate intravertebral disc herniation at T12. Other chronic degenerative osseous findings. Diffuse abnormal permeative bone marrow pattern, possibly aggressive osteopenia or a benign or malignant marrow infiltrating disease. Small suspicious 6 mm lytic and sclerotic lesion in the T1 vertebra, possibly metastatic. Nonspecific mildly enlarged mediastinal lymph node that could be benign or metastatic. Cardiomegaly and pulmonary artery hypertension with coarse coronary calcifications, trace left pleural effusion, small right pleural effusion, mild pulmonary edema, bilateral bronchitis and possible superimposed infectious pneumonitis. Sequela of prior radiation therapy in the right middle lobe. Thoracoabdominal aortic calcified atherosclerosis and emphysema.  CT lumbar spine IMPRESSION: Interval T12 superior endplate intravertebral disc herniation, which could be acute. No acute bony injury of the lumbar spine. Diffusely abnormal permeative bone marrow pattern concerning for aggressive osteopenia or a benign or malignant marrow infiltrating disease. A couple small lytic and sclerotic osseous lesions, stable compared to April 2020. Lumbar muscle spasm and chronic degenerative changes  and acquired disc disease. Aortic calcified atherosclerosis and centrilobular emphysema. Mild bilateral hydronephrosis with perinephric and peri-ureteral  inflammatory changes and nonobstructing bilateral renal calculi. Recommend correlation with urinalysis to exclude an upper urinary tract infection.  Patient received fentanyl.  Review of Systems:  All systems reviewed and apart from history of presenting illness, are negative.  Past medical history: Listed above and please refer to duplicate chart under MRN [id number].  Past surgical history: Extensive, please refer to duplicate chart under MRN [id number].  Social history: No ethanol, tobacco, or drug use.  Family history: Extensive, please refer to duplicate chart under MRN [id number].  Allergies  Allergen Reactions  . Rivaroxaban     Nose Bleed X 6 hrs ; packing in ER Nasal hemorrhage  . Tape Other (See Comments)    SKIN IS THIN AND TEARS EASILY   . Tikosyn [Dofetilide] Other (See Comments)    "Long QT wave"   . Epinephrine   . Ketamine Other (See Comments)    Hallucinations     Prior to Admission medications   Medication Sig Start Date End Date Taking? Authorizing Provider  acetaminophen (TYLENOL) 500 MG tablet Take 500-1,000 mg by mouth every 6 (six) hours as needed for mild pain or headache.   Yes [provider]  amiodarone (PACERONE) 200 MG tablet Take 200 mg by mouth daily.  11/21/19  Yes [provider]  ELIQUIS 5 MG TABS tablet Take 5 mg by mouth 2 (two) times daily.  11/03/19  Yes [provider]  furosemide (LASIX) 20 MG tablet Take 20 mg by mouth daily as needed for fluid or edema.  07/21/19  Yes [provider]  levothyroxine (SYNTHROID) 137 MCG tablet Take 137 mcg by mouth See admin instructions. Take 137 mcg by mouth in the morning before breakfast on Sun/Mon/Tues/Wed/Thurs/Fri (nothing on Saturdays) 10/06/19  Yes [provider]  Lifitegrast Shirley Friar) 5 % SOLN Place 1  drop into both eyes every evening.    Yes [provider]  Multiple Vitamins-Minerals (ONE-A-DAY WOMENS 50+ ADVANTAGE) TABS Take 1 tablet by mouth daily with breakfast.   Yes [provider]  rosuvastatin (CRESTOR) 10 MG tablet Take 10 mg by mouth See admin instructions. Take 10 mg by mouth on Mondays and Thursdays 11/22/19  Yes [provider]    Physical Exam: Vitals:   12/02/19 2030 12/02/19 2100 12/02/19 2130 12/02/19 2233  BP: 140/85 (!) 145/93 (!) 147/99 (!) 155/107  Pulse: (!) 105 (!) 102 (!) 104 (!) 116  Resp: 19 19 19  (!) 21  Temp:    98.2 F (36.8 C)  TempSrc:    Oral  SpO2: 95% 95% 97% 98%  Weight:      Height:        Physical Exam  Constitutional: She is oriented to person, place, and time. She appears well-developed and well-nourished. No distress.  HENT:  Head: Normocephalic.  Eyes: Right eye exhibits no discharge. Left eye exhibits no discharge.  Cardiovascular: Regular rhythm and intact distal pulses.  Tachycardic  Pulmonary/Chest: Effort normal and breath sounds normal. No respiratory distress. She has no wheezes. She has no rales.  Abdominal: Soft. Bowel sounds are normal. She exhibits no distension. There is no abdominal tenderness. There is no guarding.  Musculoskeletal:        General: No edema.     Cervical back: Neck supple.  Neurological: She is alert and oriented to person, place, and time. No cranial nerve deficit.  Speech fluent, tongue midline, no facial droop Strength 5 out of 5 in bilateral upper and lower extremities. Sensation to light touch intact throughout.  Skin: Skin is warm and dry. She is not diaphoretic.     Labs on Admission: I have personally reviewed following labs and imaging studies  CBC: Recent Labs  Lab 12/02/19 1732 12/02/19 1751  WBC 5.4  --   HGB 11.8* 12.6  HCT 38.5 37.0  MCV 97.0  --   PLT 167  --    Basic Metabolic Panel: Recent Labs  Lab 12/02/19 1732 12/02/19 1751 12/02/19 2002    NA 141 140  --   K 4.3 4.1  --   CL 110 112*  --   CO2 20*  --   --   GLUCOSE 108* 106*  --   BUN 22 22  --   CREATININE 1.07* 1.00  --   CALCIUM 8.5*  --   --   MG  --   --  2.1   GFR: Estimated Creatinine Clearance: 57.9 mL/min (by C-G formula based on SCr of 1 mg/dL). Liver Function Tests: Recent Labs  Lab 12/02/19 1732  AST 18  ALT 19  ALKPHOS 80  BILITOT 0.5  PROT 5.8*  ALBUMIN 3.0*   No results for input(s): LIPASE, AMYLASE in the last 168 hours. No results for input(s): AMMONIA in the last 168 hours. Coagulation Profile: Recent Labs  Lab 12/02/19 1732  INR 1.5*   Cardiac Enzymes: No results for input(s): CKTOTAL, CKMB, CKMBINDEX, TROPONINI in the last 168 hours. BNP (last 3 results) No results for input(s): PROBNP in the last 8760 hours. HbA1C: No results for input(s): HGBA1C in the last 72 hours. CBG: No results for input(s): GLUCAP in the last 168 hours. Lipid Profile: No results for input(s): CHOL, HDL, LDLCALC, TRIG, CHOLHDL, LDLDIRECT in the last 72 hours. Thyroid Function Tests: No results for input(s): TSH, T4TOTAL, FREET4, T3FREE, THYROIDAB in the last 72 hours. Anemia Panel: No results for input(s): VITAMINB12, FOLATE, FERRITIN, TIBC, IRON, RETICCTPCT in the last 72 hours. Urine analysis:    Component Value Date/Time   COLORURINE YELLOW 12/02/2019 1920   APPEARANCEUR CLEAR 12/02/2019 1920   LABSPEC 1.012 12/02/2019 1920   PHURINE 7.0 12/02/2019 1920   GLUCOSEU NEGATIVE 12/02/2019 1920   HGBUR SMALL (A) 12/02/2019 1920   BILIRUBINUR NEGATIVE 12/02/2019 1920   KETONESUR NEGATIVE 12/02/2019 1920   PROTEINUR NEGATIVE 12/02/2019 1920   NITRITE NEGATIVE 12/02/2019 1920   LEUKOCYTESUR SMALL (A) 12/02/2019 1920    Radiological Exams on Admission: CT Head Wo Contrast  Result Date: 12/02/2019 CLINICAL DATA:  Level 2 trauma. Fall hitting the back of the head. EXAM: CT HEAD WITHOUT CONTRAST TECHNIQUE: Contiguous axial images were obtained from the  base of the skull through the vertex without intravenous contrast. Automatic exposure control utilized. COMPARISON:  January 02, 2019 FINDINGS: Brain: A hyperdense 7 mm focus in the right occipital lobe cortical gray matter on series 3, image 15 and series 5, image 61 which is new and concerning for an acute intraparenchymal hemorrhage. A chronic linear density in the left temporal convexity on series 3, image 13 is unchanged and probably vascular in etiology. There is no abnormal extra-axial fluid collection, midline shift, mass effect, vascular territory distribution infarction. Chronic left caudate lacunar infarction and bilateral cerebral microvascular white matter ischemic changes are similar. There is redemonstration of basal ganglia calcifications. Vascular: Atherosclerotic calcifications including along a right sylvian fissure and anterior parafalcine vessel. Skull: No acute skull fracture. No subgaleal hematoma. Sinuses/Orbits: Mild bilateral frontal and ethmoidal mucosal thickening. No posttraumatic air-fluid levels. Normal bilateral orbital soft tissues. Other: Metallic  dental artifact degrades image quality, particularly in the posterior fossa. I discussed critical Value/emergent results and the need for surveillance imaging by telephone at the time of interpretation on 12/02/2019 at 6:45 pm with provider [facility] , who verbally acknowledged these results. IMPRESSION: A small 7 mm hyperdense focus in the right occipital lobe is concerning for an acute intraparenchymal hemorrhage. An underlying hemorrhagic metastatic lesion is a differential consideration. Surveillance CT brain without and with intravenous contrast recommended in 2-3 months for further evaluation given the cardiac pacemaker. No acute skull fracture. Chronic microvascular ischemic changes in both cerebral hemispheres and left lacunar infarction. Electronically Signed   By: Revonda Humphrey   On: 12/02/2019 18:46   CT Cervical Spine Wo  Contrast  Result Date: 12/02/2019 CLINICAL DATA:  Level 2 trauma. Fall with neck pain. EXAM: CT CERVICAL SPINE WITHOUT CONTRAST TECHNIQUE: Multidetector CT imaging of the cervical spine was performed without intravenous contrast. Multiplanar CT image reconstructions were also generated. COMPARISON:  July 29, 2018 FINDINGS: Alignment: Degenerative appearing grade 1 retrolisthesis of C5 on C6 and C6 on C7. Reversal of the cervical lordosis. Skull base and vertebrae: No acute fracture. Degenerative anterior wedging and endplate moderate hypertrophy of the mid cervical levels. Soft tissues and spinal canal: No prevertebral fluid or swelling. No visible canal hematoma. Disc levels: No posttraumatic disruption of the disc spaces. Diffuse cervical degenerative disc space narrowing, advanced from C5 through C7. Upper chest: Centrilobular emphysema with bilateral upper lobe bronchitis, small right 4 Hounsfield unit dependent pleural effusion, and patchy ground-glass opacity and airspace disease in the medial upper lobes, possibly infectious. Aortic arch calcified atherosclerosis. Thyroidectomy surgical clips. Bilateral carotid calcified atherosclerosis. Partial imaging of a cardiac pacemaker. A benign granulomatous calcification in the left lung apex. Other: Diffuse bone demineralization. A 6 mm T1 and a 5 mm C3 ovoid lytic lesion, concerning for possible metastatic disease to the skeleton. IMPRESSION: 1. No acute fracture or malalignment of the cervical spine. 2. Two suspicious small lytic lesions in the T1 and C3 vertebra, possibly osteolytic metastasis to the skeleton. 3. Centrilobular emphysema with bilateral upper lobe bronchitis, small right pleural effusion, and patchy pulmonary disease, possibly infectious. 4. Aortic atherosclerosis (ICD10-I70.0) and Emphysema (ICD10-J43.9). 5. Advanced cervical degenerative disc disease. 6. Diffuse bone demineralization. Electronically Signed   By: Revonda Humphrey   On:  12/02/2019 19:04   CT Thoracic Spine Wo Contrast  Result Date: 12/02/2019 CLINICAL DATA:  Level 2 trauma.  Fall with back pain. EXAM: CT THORACIC SPINE WITHOUT CONTRAST TECHNIQUE: Multidetector CT images of the thoracic were obtained using the standard protocol without intravenous contrast. Automatic exposure control utilized. COMPARISON:  CT chest dated August 13, 2019 FINDINGS: Alignment: Normal. Vertebrae: Interval T12 superior endplate intravertebral disc herniation, possibly acute. Chronic mild compression deformities of T12, and the lower cervical vertebra without an acute compression fracture or fracture lucency. Redemonstration of abnormal diffuse permeative bone marrow pattern. Suspicious small lytic and sclerotic lesion measuring 6 mm in the T1 vertebra. Paraspinal and other soft tissues: Mildly enlarged precarinal lymph node measuring 12 mm short axis diameter, noncalcified. Cardiomegaly with pulmonary artery hypertension, cardiac pacemaker, coarse mitral annulus calcification and coarse coronary calcifications. Small right 7 Hounsfield unit pleural effusion. Trace left pleural fluid. Central pulmonary vascular congestion and patchy airspace and small airways disease in both lungs, bilateral bronchitis, centrilobular emphysema, and right middle lobe fibrosis, likely sequela from prior radiation therapy. Disc levels: No posttraumatic widening or disruption. Intervertebral vacuum disc phenomenon at T11-T12. IMPRESSION: Possibly  acute superior endplate intravertebral disc herniation at T12. Other chronic degenerative osseous findings. Diffuse abnormal permeative bone marrow pattern, possibly aggressive osteopenia or a benign or malignant marrow infiltrating disease. Small suspicious 6 mm lytic and sclerotic lesion in the T1 vertebra, possibly metastatic. Nonspecific mildly enlarged mediastinal lymph node that could be benign or metastatic. Cardiomegaly and pulmonary artery hypertension with coarse  coronary calcifications, trace left pleural effusion, small right pleural effusion, mild pulmonary edema, bilateral bronchitis and possible superimposed infectious pneumonitis. Sequela of prior radiation therapy in the right middle lobe. Thoracoabdominal aortic calcified atherosclerosis and emphysema. Electronically Signed   By: Revonda Humphrey   On: 12/02/2019 19:35   CT Lumbar Spine Wo Contrast  Result Date: 12/02/2019 CLINICAL DATA:  Level 2 trauma.  Fall with back pain. EXAM: CT LUMBAR SPINE WITHOUT CONTRAST TECHNIQUE: Multidetector CT imaging of the lumbar spine was performed without intravenous contrast administration. Multiplanar CT image reconstructions were also generated. Automatic exposure control utilized. COMPARISON:  January 02, 2019. FINDINGS: Segmentation: Five non-rib-bearing lumbar vertebral bodies. Alignment: Grade 1 retrolisthesis of L3 on L4, unchanged and likely degenerative. Straightening of the lumbar lordosis. Vertebrae: Interval T12 small intravertebral disc herniation. Similar chronic mild 10% compression deformities of the T12 and L4 superior endplates. Diffuse permeative bone pattern. A 5 mm L2 and T11 suspicious lytic lesion. An L2 5 mm sclerotic lesion, unchanged. Paraspinal and other soft tissues: Centrilobular emphysema. Remote gastric banding with hiatal hernia. Mild bilateral hydronephrosis with mild perinephric and peri ureteral inflammatory change, possibly infectious or secondary to medical renal disease. Nonobstructing left and right renal calcifications. Benign splenic granulomatous calcification. No retroperitoneal hematoma. Aortic calcified atherosclerosis without an abdominal aortic aneurysm. Disc levels: No posttraumatic disruption. Diffuse mild and moderate acquired disc disease and hypertrophic bone spurring. IMPRESSION: Interval T12 superior endplate intravertebral disc herniation, which could be acute. No acute bony injury of the lumbar spine. Diffusely abnormal  permeative bone marrow pattern concerning for aggressive osteopenia or a benign or malignant marrow infiltrating disease. A couple small lytic and sclerotic osseous lesions, stable compared to April 2020. Lumbar muscle spasm and chronic degenerative changes and acquired disc disease. Aortic calcified atherosclerosis and centrilobular emphysema. Mild bilateral hydronephrosis with perinephric and peri-ureteral inflammatory changes and nonobstructing bilateral renal calculi. Recommend correlation with urinalysis to exclude an upper urinary tract infection. Electronically Signed   By: Revonda Humphrey   On: 12/02/2019 19:20   DG Chest Port 1 View  Result Date: 12/02/2019 CLINICAL DATA:  Trauma. Fall from standing.  On anticoagulation. EXAM: PORTABLE CHEST 1 VIEW COMPARISON:  Radiograph 04/02/2019 FINDINGS: Borderline cardiomegaly. Post left atrial clipping. Mediastinal contours are unchanged. Left-sided pacemaker with leads unchanged in position. No focal airspace disease. No pneumothorax. No large pleural effusion. No pulmonary edema. Reverse right shoulder arthroplasty. No acute osseous abnormalities are seen. Surgical clips at the thoracic inlet. IMPRESSION: No evidence of acute traumatic injury to the thorax. Electronically Signed   By: Keith Rake M.D.   On: 12/02/2019 18:10    EKG: Independently reviewed. Atrial fibrillation, heart rate 112, QTc 510. No prior tracing for comparison.  Assessment/Plan Principal Problem:   ICH (intracerebral hemorrhage) (HCC) Active Problems:   A-fib (HCC)   Anemia   QT prolongation   Hypothyroidism   ICH vs hemorrhagic metastatic lesion of the brain: On chronic anticoagulation with Eliquis for A. fib.  Head CT showing a small 7 mm hyperdense focus in the right occipital lobe concerning for an acute intraparenchymal hemorrhage, although radiologist is also concerned  about possible underlying hemorrhagic metastatic lesion as a differential consideration.   Neurosurgery was consulted and recommending doing brain MRI to further characterize this lesion.  No plan for acute neurosurgical intervention at this time.  She has a [employer] pacemaker which needs to be reprogrammed by a representative before MRI.  Order frequent neurochecks.  Monitor H&H.  Concern for lytic lesions in the C3 and T1 vertebra: As noted on CT report mentioned above.  Has a prior history of non-Hodgkin's lymphoma and thyroid cancer.  Seen by neurosurgery and recommendation is to obtain a C-spine MRI to further characterize the lesions.  No plan for acute neurosurgical intervention at this time.  Possibly acute superior endplate intravertebral disc herniation at T12: As noted on CT report mentioned above.  Patient is not endorsing any pain at this time.  Seen by neurosurgery and no plan for acute surgical intervention at this time. Recommending ordering the TLSO brace and treating as an acute fracture if patient develops back pain when ambulating.  Will order PT and OT evaluation.  Recent falls/syncopal event: Suspect related to underlying arrhythmia.  Pacemaker interrogation done in the ED revealed ongoing multiple episodes of AT/AF and 3 episodes of NSVT on 3/9.  Cardiology has been consulted, appreciate recommendations.  A. Fib: Currently rate controlled.  Cardiology has been consulted.  Continue home amiodarone.  Not on a beta-blocker at home.  Hold Eliquis given concern for intracranial bleed.  ?UTI: Afebrile and no leukocytosis. UA with small amount of leukocytes, 6-10 WBCs, and rare bacteria.  Patient is not endorsing UTI symptoms.  Urine culture has been ordered.  Anemia: Hemoglobin 11.8, previously in the 13 range (under duplicate chart).  Check FOBT and anemia panel.  Repeat CBC in a.m.  QT prolongation on EKG: Suspect related to home amiodarone use.  Cardiology has been consulted.  Continue cardiac monitoring.  Keep potassium above 4 and magnesium above 2.  Repeat EKG in  a.m.  Abnormal CT finding: CT C-spine with concern for patchy pulmonary disease which radiologist feels could possibly be infectious.  Pneumonia less likely given no fever or leukocytosis.  Patient is not endorsing any respiratory symptoms.  No hypoxia or signs of respiratory distress.  Will order chest CT for further evaluation  History of non-Hodgkin's lymphoma: Followed by Dr. Sonny Dandy and was treated with chemotherapy in 2018.  CT with concern for lytic lesions in the cervical and thoracic vertebrae as mentioned above.  Please consult oncology in a.m.  Will order CT chest/abdomen/pelvis for further evaluation.  Hypothyroidism: Continue home Synthroid  Hypertension: Systolic currently in 086V to 150s.  Order labetalol PRN SBP >160.   Hyperlipidemia: Continue home Crestor  DVT prophylaxis: SCDs Code Status: Patient wishes to be full code. Family Communication: No family available at this time. Disposition Plan: Anticipate discharge after greater than 2 midnights. Consults called: Neurosurgery, cardiology Admission status: It is my clinical opinion that admission to INPATIENT is reasonable and necessary because of the expectation that this patient will require hospital care that crosses at least 2 midnights to treat this condition based on the medical complexity of the problems presented.  Given the aforementioned information, the predictability of an adverse outcome is felt to be significant.  The medical decision making on this patient was of high complexity and the patient is at high risk for clinical deterioration, therefore this is a level 3 visit.  Shela Leff MD [facility]  If 7PM-7AM, please contact night-coverage [email or web address]  12/02/2019, 10:45 PM

## 2019-12-02 NOTE — Progress Notes (Signed)
Chaplain responded to alert.  Chaplain is available to provide support as needed.

## 2019-12-02 NOTE — ED Notes (Signed)
Talked to [place]- patients model is [id number], for the MRI pts pacemaker needs to be reprogrammed by a rep

## 2019-12-02 NOTE — ED Notes (Signed)
Assumed care on patient , respirations unlabored , denies pain , alert and oriented , C- collar and IV intact . Pacemaker interrogated , patient waiting for MRI.

## 2019-12-02 NOTE — ED Notes (Signed)
Secretary notified [employer] technician to inquire regarding patient's pacemaker model and MD order for MRI .

## 2019-12-02 NOTE — Consult Note (Signed)
Cardiology Consultation:   Patient ID: [name] MRN: [id number]; DOB: 07/11/1946  Admit date: 12/02/2019 Date of Consult: 12/02/2019  Primary Care Provider: [name], MD Primary Cardiologist: No primary care provider on file.  Primary Electrophysiologist:  None Dr. Caryl Comes   Patient Profile:   Kayla Harrison is a 74 y.o. female with a hx of multiple cancers (thyroid, breast, NHL, all in remission), morbid obesity, hypothyroid, HLD, HFpEF, OSA, recurrent presycnope/syncope, and AF/AFL s/p multiple PVI on apixaban who is being seen today for the evaluation of fall at the request of Dr. Marlowe Sax.  History of Present Illness:   [name] presents to the ED today with a fall. Notably she has had many presycnopal episodes over the past year.  These occur approximately 2-3 times every week.  She has seen Dr. Caryl Comes about these and has undergone extensive testing without identification of a clear cause. She states that she thinks AF has contributed to these episodes, though she also has chronic lower extremity pain and neuropathy that are also major contributors.   On the day prior to admission, the patient states she was getting into the shower when she felt suddenly very dizzy and lightheaded similar to her multiple prior episodes.  She started to lower herself to the ground and fell onto her backside.  She states she completely lost consciousness, which is unusual for her as she has not completely lost consciousness from 1 of these episodes for quite a long time.  She does not believe she struck her head during this episode.  She does not know how long she was unconscious but thinks it could not have been more than a few seconds.  She woke up and quickly returned to her normal state of health.  Earlier today, the patient had an episode of presyncope.  This time however, she fell to the ground and struck the back of her head onto the concrete sidewalk.  She denies having any  prodrome of palpitations, chest pain, shortness of breath.  She has noticed that she has been in atrial fibrillation for much of the day today and thinks that this might have been one of the drivers of her episode, however she also states that the primary driver of her episode today was leg pain.  She did not lose consciousness during today's episode.  Given the fact that she is on apixaban for anticoagulation and she struck her head, she became concerned and came to the emergency department for work-up.  In the emergency department, the patient was found to be hypertensive with systolic blood pressures ranging from 140-190.  She was tachycardic with heart rates ranging from 102 120s in atrial fibrillation.  A CT scan of her head was performed which showed a small 7 mm hyperdense focus in the right occipital lobe concerning for acute intraparenchymal hemorrhage, however an underlying hemorrhagic metastatic lesion was also considered possible.  A brain MRI was ordered to further work this up.  Neurosurgery was consulted and recommended no acute neurosurgical intervention, but admission to the hospitalist service and close monitoring.  On my examination, the patient complains of a posterior headache but otherwise is asymptomatic.  Heart Pathway Score:     History reviewed. No pertinent past medical history.  Duplicate chart containing full medical history marked for merger    Home Medications:  Prior to Admission medications   Medication Sig Start Date End Date Taking? Authorizing Provider  acetaminophen (TYLENOL) 500 MG tablet Take 500-1,000 mg by mouth  every 6 (six) hours as needed for mild pain or headache.   Yes [provider]  amiodarone (PACERONE) 200 MG tablet Take 200 mg by mouth daily.  11/21/19  Yes [provider]  ELIQUIS 5 MG TABS tablet Take 5 mg by mouth 2 (two) times daily.  11/03/19  Yes [provider]  furosemide (LASIX) 20 MG tablet Take 20 mg by mouth  daily as needed for fluid or edema.  07/21/19  Yes [provider]  levothyroxine (SYNTHROID) 137 MCG tablet Take 137 mcg by mouth See admin instructions. Take 137 mcg by mouth in the morning before breakfast on Sun/Mon/Tues/Wed/Thurs/Fri (nothing on Saturdays) 10/06/19  Yes [provider]  Lifitegrast Shirley Friar) 5 % SOLN Place 1 drop into both eyes every evening.    Yes [provider]  Multiple Vitamins-Minerals (ONE-A-DAY WOMENS 50+ ADVANTAGE) TABS Take 1 tablet by mouth daily with breakfast.   Yes [provider]  rosuvastatin (CRESTOR) 10 MG tablet Take 10 mg by mouth See admin instructions. Take 10 mg by mouth on Mondays and Thursdays 11/22/19  Yes [provider]    Inpatient Medications: Scheduled Meds:  Continuous Infusions:  PRN Meds:   Allergies:    Allergies  Allergen Reactions  . Tape Other (See Comments)    SKIN IS THIN AND TEARS EASILY   . Ketamine Other (See Comments)    Hallucinations   . Tikosyn [Dofetilide] Other (See Comments)    "Long QT wave"     Social History:   Social History   Socioeconomic History  . Marital status: Single    Spouse name: Not on file  . Number of children: Not on file  . Years of education: Not on file  . Highest education level: Not on file  Occupational History  . Not on file  Tobacco Use  . Smoking status: Not on file  Substance and Sexual Activity  . Alcohol use: Not on file  . Drug use: Not on file  . Sexual activity: Not on file  Other Topics Concern  . Not on file  Social History Narrative  . Not on file   Social Determinants of Health   Financial Resource Strain:   . Difficulty of Paying Living Expenses:   Food Insecurity:   . Worried About Charity fundraiser in the Last Year:   . Arboriculturist in the Last Year:   Transportation Needs:   . Film/video editor (Medical):   Marland Kitchen Lack of Transportation (Non-Medical):   Physical Activity:   . Days of Exercise per  Week:   . Minutes of Exercise per Session:   Stress:   . Feeling of Stress :   Social Connections:   . Frequency of Communication with Friends and Family:   . Frequency of Social Gatherings with Friends and Family:   . Attends Religious Services:   . Active Member of Clubs or Organizations:   . Attends Archivist Meetings:   Marland Kitchen Marital Status:   Intimate Partner Violence:   . Fear of Current or Ex-Partner:   . Emotionally Abused:   Marland Kitchen Physically Abused:   . Sexually Abused:     Family History:   History reviewed. No pertinent family history.   ROS:  Please see the history of present illness.   All other ROS reviewed and negative.     Physical Exam/Data:   Vitals:   12/02/19 1856 12/02/19 1900 12/02/19 2000 12/02/19 2030  BP:  Marland Kitchen)  158/99 (!) 155/93 140/85  Pulse:  (!) 104 (!) 104 (!) 105  Resp:  (!) 23 14 19   Temp:      TempSrc:      SpO2:  100% 99% 95%  Weight: 99.8 kg     Height: 5\' 6"  (1.676 m)       Intake/Output Summary (Last 24 hours) at 12/02/2019 2046 Last data filed at 12/02/2019 2035 Gross per 24 hour  Intake 0 ml  Output 250 ml  Net -250 ml   Last 3 Weights 12/02/2019  Weight (lbs) 220 lb  Weight (kg) 99.791 kg     Body mass index is 35.51 kg/m.  General:  Well nourished, well developed, in no acute distress HEENT: normal Neck: no JVD Endocrine:  No thryomegaly Vascular: No carotid bruits; FA pulses 2+ bilaterally without bruits  Cardiac:  normal S1, S2; irregularly irregular rhythm; soft systolic murmur heard at the LLSB Lungs:  clear to auscultation bilaterally, no wheezing, rhonchi or rales  Abd: soft, nontender, no hepatomegaly  Ext: 1+ pitting edema of the bilateral ankles Skin: warm and dry, many superficial varicosities throughout Neuro:  CNs 2-12 intact, no focal abnormalities noted Psych:  Normal affect   EKG:  The EKG was personally reviewed and demonstrates:  AF, HR 112 bpm, no ST/T wave abnormalities  Relevant CV  Studies: Echo from 05/2019: 1. Left ventricular ejection fraction, by visual estimation, is 50 to  55%. The left ventricle has mildly decreased function. Normal left  ventricular size. Left ventricular septal wall thickness was normal.  Normal left ventricular posterior wall  thickness. There is no left ventricular hypertrophy.  2. LV diffuse mild hypokinesis without focal wall motion abnormalities.  3. Left ventricular diastolic Doppler parameters are consistent with  restrictive filling pattern of LV diastolic filling.  4. Global right ventricle has mildly reduced systolic function.The right  ventricular size is moderately enlarged. No increase in right ventricular  wall thickness.  5. Left atrial size was moderately dilated.  6. Right atrial size was normal.  7. Moderate to severe mitral annular calcification.  8. Moderately decreased mobility of the mitral valve leaflets.  9. The mitral valve is degenerative. Mild mitral valve regurgitation.  Mild mitral stenosis.  10. The tricuspid valve is normal in structure. Tricuspid valve  regurgitation moderate.  11. The aortic valve is tricuspid Aortic valve regurgitation was not  visualized by color flow Doppler. Mild aortic valve sclerosis without  stenosis.  12. The pulmonic valve was normal in structure. Pulmonic valve  regurgitation is mild by color flow Doppler.  13. Moderately elevated pulmonary artery systolic pressure.  14. A pacer wire is visualized in the RA and RV.  15. The inferior vena cava is normal in size with <50% respiratory  variability, suggesting right atrial pressure of 8 mmHg.   Laboratory Data:  High Sensitivity Troponin:   Recent Labs  Lab 12/02/19 1732  TROPONINIHS 5     Chemistry Recent Labs  Lab 12/02/19 1732 12/02/19 1751  NA 141 140  K 4.3 4.1  CL 110 112*  CO2 20*  --   GLUCOSE 108* 106*  BUN 22 22  CREATININE 1.07* 1.00  CALCIUM 8.5*  --   GFRNONAA 51*  --   GFRAA 59*  --    ANIONGAP 11  --     Recent Labs  Lab 12/02/19 1732  PROT 5.8*  ALBUMIN 3.0*  AST 18  ALT 19  ALKPHOS 80  BILITOT 0.5   Hematology Recent  Labs  Lab 12/02/19 1732 12/02/19 1751  WBC 5.4  --   RBC 3.97  --   HGB 11.8* 12.6  HCT 38.5 37.0  MCV 97.0  --   MCH 29.7  --   MCHC 30.6  --   RDW 14.4  --   PLT 167  --    BNPNo results for input(s): BNP, PROBNP in the last 168 hours.  DDimer No results for input(s): DDIMER in the last 168 hours.   Radiology/Studies:  CT Head Wo Contrast  Result Date: 12/02/2019 CLINICAL DATA:  Level 2 trauma. Fall hitting the back of the head. EXAM: CT HEAD WITHOUT CONTRAST TECHNIQUE: Contiguous axial images were obtained from the base of the skull through the vertex without intravenous contrast. Automatic exposure control utilized. COMPARISON:  January 02, 2019 FINDINGS: Brain: A hyperdense 7 mm focus in the right occipital lobe cortical gray matter on series 3, image 15 and series 5, image 61 which is new and concerning for an acute intraparenchymal hemorrhage. A chronic linear density in the left temporal convexity on series 3, image 13 is unchanged and probably vascular in etiology. There is no abnormal extra-axial fluid collection, midline shift, mass effect, vascular territory distribution infarction. Chronic left caudate lacunar infarction and bilateral cerebral microvascular white matter ischemic changes are similar. There is redemonstration of basal ganglia calcifications. Vascular: Atherosclerotic calcifications including along a right sylvian fissure and anterior parafalcine vessel. Skull: No acute skull fracture. No subgaleal hematoma. Sinuses/Orbits: Mild bilateral frontal and ethmoidal mucosal thickening. No posttraumatic air-fluid levels. Normal bilateral orbital soft tissues. Other: Metallic dental artifact degrades image quality, particularly in the posterior fossa. I discussed critical Value/emergent results and the need for surveillance  imaging by telephone at the time of interpretation on 12/02/2019 at 6:45 pm with provider [facility] , who verbally acknowledged these results. IMPRESSION: A small 7 mm hyperdense focus in the right occipital lobe is concerning for an acute intraparenchymal hemorrhage. An underlying hemorrhagic metastatic lesion is a differential consideration. Surveillance CT brain without and with intravenous contrast recommended in 2-3 months for further evaluation given the cardiac pacemaker. No acute skull fracture. Chronic microvascular ischemic changes in both cerebral hemispheres and left lacunar infarction. Electronically Signed   By: Revonda Humphrey   On: 12/02/2019 18:46   CT Cervical Spine Wo Contrast  Result Date: 12/02/2019 CLINICAL DATA:  Level 2 trauma. Fall with neck pain. EXAM: CT CERVICAL SPINE WITHOUT CONTRAST TECHNIQUE: Multidetector CT imaging of the cervical spine was performed without intravenous contrast. Multiplanar CT image reconstructions were also generated. COMPARISON:  July 29, 2018 FINDINGS: Alignment: Degenerative appearing grade 1 retrolisthesis of C5 on C6 and C6 on C7. Reversal of the cervical lordosis. Skull base and vertebrae: No acute fracture. Degenerative anterior wedging and endplate moderate hypertrophy of the mid cervical levels. Soft tissues and spinal canal: No prevertebral fluid or swelling. No visible canal hematoma. Disc levels: No posttraumatic disruption of the disc spaces. Diffuse cervical degenerative disc space narrowing, advanced from C5 through C7. Upper chest: Centrilobular emphysema with bilateral upper lobe bronchitis, small right 4 Hounsfield unit dependent pleural effusion, and patchy ground-glass opacity and airspace disease in the medial upper lobes, possibly infectious. Aortic arch calcified atherosclerosis. Thyroidectomy surgical clips. Bilateral carotid calcified atherosclerosis. Partial imaging of a cardiac pacemaker. A benign granulomatous calcification in the  left lung apex. Other: Diffuse bone demineralization. A 6 mm T1 and a 5 mm C3 ovoid lytic lesion, concerning for possible metastatic disease to the skeleton. IMPRESSION:  1. No acute fracture or malalignment of the cervical spine. 2. Two suspicious small lytic lesions in the T1 and C3 vertebra, possibly osteolytic metastasis to the skeleton. 3. Centrilobular emphysema with bilateral upper lobe bronchitis, small right pleural effusion, and patchy pulmonary disease, possibly infectious. 4. Aortic atherosclerosis (ICD10-I70.0) and Emphysema (ICD10-J43.9). 5. Advanced cervical degenerative disc disease. 6. Diffuse bone demineralization. Electronically Signed   By: Revonda Humphrey   On: 12/02/2019 19:04   CT Thoracic Spine Wo Contrast  Result Date: 12/02/2019 CLINICAL DATA:  Level 2 trauma.  Fall with back pain. EXAM: CT THORACIC SPINE WITHOUT CONTRAST TECHNIQUE: Multidetector CT images of the thoracic were obtained using the standard protocol without intravenous contrast. Automatic exposure control utilized. COMPARISON:  CT chest dated August 13, 2019 FINDINGS: Alignment: Normal. Vertebrae: Interval T12 superior endplate intravertebral disc herniation, possibly acute. Chronic mild compression deformities of T12, and the lower cervical vertebra without an acute compression fracture or fracture lucency. Redemonstration of abnormal diffuse permeative bone marrow pattern. Suspicious small lytic and sclerotic lesion measuring 6 mm in the T1 vertebra. Paraspinal and other soft tissues: Mildly enlarged precarinal lymph node measuring 12 mm short axis diameter, noncalcified. Cardiomegaly with pulmonary artery hypertension, cardiac pacemaker, coarse mitral annulus calcification and coarse coronary calcifications. Small right 7 Hounsfield unit pleural effusion. Trace left pleural fluid. Central pulmonary vascular congestion and patchy airspace and small airways disease in both lungs, bilateral bronchitis, centrilobular  emphysema, and right middle lobe fibrosis, likely sequela from prior radiation therapy. Disc levels: No posttraumatic widening or disruption. Intervertebral vacuum disc phenomenon at T11-T12. IMPRESSION: Possibly acute superior endplate intravertebral disc herniation at T12. Other chronic degenerative osseous findings. Diffuse abnormal permeative bone marrow pattern, possibly aggressive osteopenia or a benign or malignant marrow infiltrating disease. Small suspicious 6 mm lytic and sclerotic lesion in the T1 vertebra, possibly metastatic. Nonspecific mildly enlarged mediastinal lymph node that could be benign or metastatic. Cardiomegaly and pulmonary artery hypertension with coarse coronary calcifications, trace left pleural effusion, small right pleural effusion, mild pulmonary edema, bilateral bronchitis and possible superimposed infectious pneumonitis. Sequela of prior radiation therapy in the right middle lobe. Thoracoabdominal aortic calcified atherosclerosis and emphysema. Electronically Signed   By: Revonda Humphrey   On: 12/02/2019 19:35   CT Lumbar Spine Wo Contrast  Result Date: 12/02/2019 CLINICAL DATA:  Level 2 trauma.  Fall with back pain. EXAM: CT LUMBAR SPINE WITHOUT CONTRAST TECHNIQUE: Multidetector CT imaging of the lumbar spine was performed without intravenous contrast administration. Multiplanar CT image reconstructions were also generated. Automatic exposure control utilized. COMPARISON:  January 02, 2019. FINDINGS: Segmentation: Five non-rib-bearing lumbar vertebral bodies. Alignment: Grade 1 retrolisthesis of L3 on L4, unchanged and likely degenerative. Straightening of the lumbar lordosis. Vertebrae: Interval T12 small intravertebral disc herniation. Similar chronic mild 10% compression deformities of the T12 and L4 superior endplates. Diffuse permeative bone pattern. A 5 mm L2 and T11 suspicious lytic lesion. An L2 5 mm sclerotic lesion, unchanged. Paraspinal and other soft tissues:  Centrilobular emphysema. Remote gastric banding with hiatal hernia. Mild bilateral hydronephrosis with mild perinephric and peri ureteral inflammatory change, possibly infectious or secondary to medical renal disease. Nonobstructing left and right renal calcifications. Benign splenic granulomatous calcification. No retroperitoneal hematoma. Aortic calcified atherosclerosis without an abdominal aortic aneurysm. Disc levels: No posttraumatic disruption. Diffuse mild and moderate acquired disc disease and hypertrophic bone spurring. IMPRESSION: Interval T12 superior endplate intravertebral disc herniation, which could be acute. No acute bony injury of the lumbar spine. Diffusely abnormal permeative  bone marrow pattern concerning for aggressive osteopenia or a benign or malignant marrow infiltrating disease. A couple small lytic and sclerotic osseous lesions, stable compared to April 2020. Lumbar muscle spasm and chronic degenerative changes and acquired disc disease. Aortic calcified atherosclerosis and centrilobular emphysema. Mild bilateral hydronephrosis with perinephric and peri-ureteral inflammatory changes and nonobstructing bilateral renal calculi. Recommend correlation with urinalysis to exclude an upper urinary tract infection. Electronically Signed   By: Revonda Humphrey   On: 12/02/2019 19:20   DG Chest Port 1 View  Result Date: 12/02/2019 CLINICAL DATA:  Trauma. Fall from standing.  On anticoagulation. EXAM: PORTABLE CHEST 1 VIEW COMPARISON:  Radiograph 04/02/2019 FINDINGS: Borderline cardiomegaly. Post left atrial clipping. Mediastinal contours are unchanged. Left-sided pacemaker with leads unchanged in position. No focal airspace disease. No pneumothorax. No large pleural effusion. No pulmonary edema. Reverse right shoulder arthroplasty. No acute osseous abnormalities are seen. Surgical clips at the thoracic inlet. IMPRESSION: No evidence of acute traumatic injury to the thorax. Electronically Signed    By: Keith Rake M.D.   On: 12/02/2019 18:10   { Assessment and Plan:  LORENE KLIMAS is a 74 y.o. female with a hx of multiple cancers (thyroid, breast, NHL, all in remission), morbid obesity, hypothyroid, HLD, HFpEF, OSA, recurrent presycnope/syncope, and AF/AFL s/p multiple PVI on apixaban who presents today with fall and intraparenchymal bleeding.   #) ICH: small focus of bleeding, per neurosurgery no need for acute intervention. Most likely traumatic in setting of fall (without syncope) and posterior head strike. Did not receive PCC/Kcentra/andexanet. Also some concern for possible metastatic lesion associated with bleeding given history of multiple cancers. Awaiting MRI.  - head MRI - will need pacer reprogrammed for MRI (not pacemaker dependent) - discontinue apixaban for now (no heparin or LMWH) - type/screen  - neurosurgery recs - consider additional imaging for cancer workup per admitting team  #) AF: rate controlled and minimally symptomatic at this time. Pacemaker interrogation showing multiple episodes of AF today with poor rate control (HR's in the 140's). TSH from last month WNL. Has been on and off amiodarone in the past, some concern for amio lung toxicity though not definitive.  - per med rec, patient currently not on beta blocker; may be reasonable to start BB given hypertension and poor rate control - if patient is currently taking amiodarone (as it appears as though she is), ok to continue home dose (200mg  daily)  #) Recurrent presyncope: device interrogated in ED. Patient with multiple episodes of AF today with V rates ranging from 100 to 140. No ventricular arrhythmia or extreme RVR that would explain syncopal episodes yesterday, however I do not see any events logged from yesterday. Should double check with rep to make sure they printed the full interrogation report including any events surrounding the time of her syncopal episode yesterday as well as her  presyncopal episode and fall today.  - EP review device interrogation in AM - if arrhythmia determined not to be driver of presyncope/syncope, may be worth neurology consult (if not already done in past) to help work up  #) HFpEF: patient euvolemic currently - monitor     For questions or updates, please contact [facility] Please consult [email or web address] for contact info under     Signed, Marcie Mowers, MD  12/02/2019 8:46 PM

## 2019-12-02 NOTE — ED Triage Notes (Signed)
Pt arrives to ED as a level 2 trauma with [facility] with complaints of a fall. Per EMS patient lost her balance, stumbled and fell back hitting the back of her head. Patient denies any LOC and remembers the event. Patient complains of head, neck, upper back and lower back pain. Patient is on eliquis for Afib. Patient in Afib on arrival and hypertensive.

## 2019-12-02 NOTE — Consult Note (Addendum)
Chief Complaint   Chief Complaint  Patient presents with  . Level 2 Fall    HPI   Consult requested by: Dr Nadeen Landau, [facility] Reason for consult: ?brain met vs. intraparenchymal hematoma  HPI: MELENY TREGONING is a 74 y.o. female with history of Afib on Eliquis, hypothyroidism, hyperlipidemia, thyroid cancer 2010's, breast cancer 2016? And non-hogdkins lymphoma 2018 who presented to ED after a fall. Patient reports losing balance and falling backwards striking head on concrete. No LOC. Has since been have fairly severe occipital HA. As part of work up CT head, C/T/L spine were ordered. CT head showed a small intraparenchymal hemorrhage, ?met, as well as potential C2 and T1 lytic lesions. A NSY consultation was requested.  She continues to complain of posterior headache. No associated changes in vision, nausea, vomiting, N/T/W. Has had intermittent syncopal episodes for the past year which she attributes to Afib. Most recent syncopal episode yesterday. She denies LBP. She has a pacemaker.  There are no problems to display for this patient.   PMH: History reviewed. No pertinent past medical history.  PSH: (Not in a hospital admission)   SH: Social History   Tobacco Use  . Smoking status: Not on file  Substance Use Topics  . Alcohol use: Not on file  . Drug use: Not on file    MEDS: Prior to Admission medications   Medication Sig Start Date End Date Taking? Authorizing Provider  acetaminophen (TYLENOL) 500 MG tablet Take 500-1,000 mg by mouth every 6 (six) hours as needed for mild pain or headache.   Yes [provider]  amiodarone (PACERONE) 200 MG tablet Take 200 mg by mouth daily.  11/21/19  Yes [provider]  ELIQUIS 5 MG TABS tablet Take 5 mg by mouth 2 (two) times daily.  11/03/19  Yes [provider]  furosemide (LASIX) 20 MG tablet Take 20 mg by mouth daily as needed for fluid or edema.  07/21/19  Yes [provider]    levothyroxine (SYNTHROID) 137 MCG tablet Take 137 mcg by mouth See admin instructions. Take 137 mcg by mouth in the morning before breakfast on Sun/Mon/Tues/Wed/Thurs/Fri (nothing on Saturdays) 10/06/19  Yes [provider]  Lifitegrast Shirley Friar) 5 % SOLN Place 1 drop into both eyes every evening.    Yes [provider]  Multiple Vitamins-Minerals (ONE-A-DAY WOMENS 50+ ADVANTAGE) TABS Take 1 tablet by mouth daily with breakfast.   Yes [provider]  rosuvastatin (CRESTOR) 10 MG tablet Take 10 mg by mouth See admin instructions. Take 10 mg by mouth on Mondays and Thursdays 11/22/19  Yes [provider]    ALLERGY: Allergies  Allergen Reactions  . Tape Other (See Comments)    SKIN IS THIN AND TEARS EASILY   . Ketamine Other (See Comments)    Hallucinations   . Tikosyn [Dofetilide] Other (See Comments)    "Long QT wave"     Social History   Tobacco Use  . Smoking status: Not on file  Substance Use Topics  . Alcohol use: Not on file     History reviewed. No pertinent family history.   ROS   Review of Systems  Constitutional: Negative.   HENT: Negative.   Eyes: Negative for blurred vision, double vision and photophobia.  Respiratory: Negative.   Cardiovascular: Negative.   Gastrointestinal: Negative for nausea and vomiting.  Genitourinary: Negative.   Musculoskeletal: Positive for falls, myalgias and neck pain. Negative for back pain.  Skin: Negative.  Neurological: Positive for headaches. Negative for dizziness, tingling, tremors, sensory change, speech change, focal weakness, loss of consciousness and weakness.    Exam   Vitals:   12/02/19 1853 12/02/19 1900  BP:  (!) 158/99  Pulse: (!) 105 (!) 104  Resp: 14 (!) 23  Temp:    SpO2: 99% 100%   General appearance: WDWN, NAD, in [place] collar GCS 15 Eyes: No scleral injection Cardiovascular: Regular rate and rhythm without murmurs, rubs, gallops. No edema or variciosities.  Distal pulses normal. Pulmonary: Effort normal, non-labored breathing Musculoskeletal:     Muscle tone upper extremities: Normal    Muscle tone lower extremities: Normal    Motor exam: Upper Extremities Deltoid Bicep Tricep Grip  Right 5/5 5/5 5/5 5/5  Left 5/5 5/5 5/5 5/5   Lower Extremity IP Quad PF DF EHL  Right 5/5 5/5 5/5 5/5 5/5  Left 5/5 5/5 5/5 5/5 5/5   Neurological Mental Status:    - Patient is awake, alert, oriented to person, place, month, year, and situation    - Patient is able to give a clear and coherent history.    - No signs of aphasia or neglect Cranial Nerves    - II: Visual Fields are full. PERRL    - III/IV/VI: EOMI without ptosis or diploplia.     - V: Facial sensation is grossly normal    - VII: Facial movement is symmetric.     - VIII: hearing is intact to voice    - X: Uvula elevates symmetrically    - XI: Shoulder shrug is symmetric.    - XII: tongue is midline without atrophy or fasciculations.  Sensory: Sensation grossly intact to LT Deep Tendon Reflexes    - 2+ and symmetric in the biceps and patellae.   Results - Imaging/Labs   Results for orders placed or performed during the hospital encounter of 12/02/19 (from the past 48 hour(s))  Comprehensive metabolic panel     Status: Abnormal   Collection Time: 12/02/19  5:32 PM  Result Value Ref Range   Sodium 141 135 - 145 mmol/L   Potassium 4.3 3.5 - 5.1 mmol/L   Chloride 110 98 - 111 mmol/L   CO2 20 (L) 22 - 32 mmol/L   Glucose, Bld 108 (H) 70 - 99 mg/dL    Comment: Glucose reference range applies only to samples taken after fasting for at least 8 hours.   BUN 22 8 - 23 mg/dL   Creatinine, Ser 1.07 (H) 0.44 - 1.00 mg/dL   Calcium 8.5 (L) 8.9 - 10.3 mg/dL   Total Protein 5.8 (L) 6.5 - 8.1 g/dL   Albumin 3.0 (L) 3.5 - 5.0 g/dL   AST 18 15 - 41 U/L   ALT 19 0 - 44 U/L   Alkaline Phosphatase 80 38 - 126 U/L   Total Bilirubin 0.5 0.3 - 1.2 mg/dL   GFR calc non Af Amer 51 (L) >60 mL/min    GFR calc Af Amer 59 (L) >60 mL/min   Anion gap 11 5 - 15    Comment: Performed at [facility] [address] [place] [address]  CBC     Status: Abnormal   Collection Time: 12/02/19  5:32 PM  Result Value Ref Range   WBC 5.4 4.0 - 10.5 K/uL   RBC 3.97 3.87 - 5.11 MIL/uL   Hemoglobin 11.8 (L) 12.0 - 15.0 g/dL   HCT 38.5 36.0 - 46.0 %   MCV  97.0 80.0 - 100.0 fL   MCH 29.7 26.0 - 34.0 pg   MCHC 30.6 30.0 - 36.0 g/dL   RDW 14.4 11.5 - 15.5 %   Platelets 167 150 - 400 K/uL   nRBC 0.0 0.0 - 0.2 %    Comment: Performed at [facility], [place] [address], [place], [place] [address]  Ethanol     Status: None   Collection Time: 12/02/19  5:32 PM  Result Value Ref Range   Alcohol, Ethyl (B) <10 <10 mg/dL    Comment: (NOTE) Lowest detectable limit for serum alcohol is 10 mg/dL. For medical purposes only. Performed at [facility], [place] [address], [place], [place] [address]   Protime-INR     Status: Abnormal   Collection Time: 12/02/19  5:32 PM  Result Value Ref Range   Prothrombin Time 17.7 (H) 11.4 - 15.2 seconds   INR 1.5 (H) 0.8 - 1.2    Comment: (NOTE) INR goal varies based on device and disease states. Performed at [facility], [place] [address] [place], [place] [address]   Troponin I (High Sensitivity)     Status: None   Collection Time: 12/02/19  5:32 PM  Result Value Ref Range   Troponin I (High Sensitivity) 5 <18 ng/L    Comment: (NOTE) Elevated high sensitivity troponin I (hsTnI) values and significant  changes across serial measurements may suggest ACS but many other  chronic and acute conditions are known to elevate hsTnI results.  Refer to the "Links" section for chest pain algorithms and additional  guidance. Performed at [facility], [place] [address] [place], [place] [address]   I-Stat Chem 8, ED     Status: Abnormal   Collection Time: 12/02/19  5:51 PM  Result Value Ref Range   Sodium 140 135 - 145 mmol/L   Potassium 4.1  3.5 - 5.1 mmol/L   Chloride 112 (H) 98 - 111 mmol/L   BUN 22 8 - 23 mg/dL   Creatinine, Ser 1.00 0.44 - 1.00 mg/dL   Glucose, Bld 106 (H) 70 - 99 mg/dL    Comment: Glucose reference range applies only to samples taken after fasting for at least 8 hours.   Calcium, Ion 0.93 (L) 1.15 - 1.40 mmol/L   TCO2 22 22 - 32 mmol/L   Hemoglobin 12.6 12.0 - 15.0 g/dL   HCT 37.0 36.0 - 46.0 %  Urinalysis, Routine w reflex microscopic     Status: Abnormal   Collection Time: 12/02/19  7:20 PM  Result Value Ref Range   Color, Urine YELLOW YELLOW   APPearance CLEAR CLEAR   Specific Gravity, Urine 1.012 1.005 - 1.030   pH 7.0 5.0 - 8.0   Glucose, UA NEGATIVE NEGATIVE mg/dL   Hgb urine dipstick SMALL (A) NEGATIVE   Bilirubin Urine NEGATIVE NEGATIVE   Ketones, ur NEGATIVE NEGATIVE mg/dL   Protein, ur NEGATIVE NEGATIVE mg/dL   Nitrite NEGATIVE NEGATIVE   Leukocytes,Ua SMALL (A) NEGATIVE   RBC / HPF 0-5 0 - 5 RBC/hpf   WBC, UA 6-10 0 - 5 WBC/hpf   Bacteria, UA RARE (A) NONE SEEN   Squamous Epithelial / LPF 0-5 0 - 5    Comment: Performed at [facility], [place] [address], [place], [place] [address]    CT Head Wo Contrast  Result Date: 12/02/2019 CLINICAL DATA:  Level 2 trauma. Fall hitting the back of the head. EXAM: CT HEAD WITHOUT CONTRAST TECHNIQUE: Contiguous  axial images were obtained from the base of the skull through the vertex without intravenous contrast. Automatic exposure control utilized. COMPARISON:  January 02, 2019 FINDINGS: Brain: A hyperdense 7 mm focus in the right occipital lobe cortical gray matter on series 3, image 15 and series 5, image 61 which is new and concerning for an acute intraparenchymal hemorrhage. A chronic linear density in the left temporal convexity on series 3, image 13 is unchanged and probably vascular in etiology. There is no abnormal extra-axial fluid collection, midline shift, mass effect, vascular territory distribution infarction. Chronic left caudate lacunar  infarction and bilateral cerebral microvascular white matter ischemic changes are similar. There is redemonstration of basal ganglia calcifications. Vascular: Atherosclerotic calcifications including along a right sylvian fissure and anterior parafalcine vessel. Skull: No acute skull fracture. No subgaleal hematoma. Sinuses/Orbits: Mild bilateral frontal and ethmoidal mucosal thickening. No posttraumatic air-fluid levels. Normal bilateral orbital soft tissues. Other: Metallic dental artifact degrades image quality, particularly in the posterior fossa. I discussed critical Value/emergent results and the need for surveillance imaging by telephone at the time of interpretation on 12/02/2019 at 6:45 pm with provider [facility] , who verbally acknowledged these results. IMPRESSION: A small 7 mm hyperdense focus in the right occipital lobe is concerning for an acute intraparenchymal hemorrhage. An underlying hemorrhagic metastatic lesion is a differential consideration. Surveillance CT brain without and with intravenous contrast recommended in 2-3 months for further evaluation given the cardiac pacemaker. No acute skull fracture. Chronic microvascular ischemic changes in both cerebral hemispheres and left lacunar infarction. Electronically Signed   By: Revonda Humphrey   On: 12/02/2019 18:46   CT Cervical Spine Wo Contrast  Result Date: 12/02/2019 CLINICAL DATA:  Level 2 trauma. Fall with neck pain. EXAM: CT CERVICAL SPINE WITHOUT CONTRAST TECHNIQUE: Multidetector CT imaging of the cervical spine was performed without intravenous contrast. Multiplanar CT image reconstructions were also generated. COMPARISON:  July 29, 2018 FINDINGS: Alignment: Degenerative appearing grade 1 retrolisthesis of C5 on C6 and C6 on C7. Reversal of the cervical lordosis. Skull base and vertebrae: No acute fracture. Degenerative anterior wedging and endplate moderate hypertrophy of the mid cervical levels. Soft tissues and spinal canal: No  prevertebral fluid or swelling. No visible canal hematoma. Disc levels: No posttraumatic disruption of the disc spaces. Diffuse cervical degenerative disc space narrowing, advanced from C5 through C7. Upper chest: Centrilobular emphysema with bilateral upper lobe bronchitis, small right 4 Hounsfield unit dependent pleural effusion, and patchy ground-glass opacity and airspace disease in the medial upper lobes, possibly infectious. Aortic arch calcified atherosclerosis. Thyroidectomy surgical clips. Bilateral carotid calcified atherosclerosis. Partial imaging of a cardiac pacemaker. A benign granulomatous calcification in the left lung apex. Other: Diffuse bone demineralization. A 6 mm T1 and a 5 mm C3 ovoid lytic lesion, concerning for possible metastatic disease to the skeleton. IMPRESSION: 1. No acute fracture or malalignment of the cervical spine. 2. Two suspicious small lytic lesions in the T1 and C3 vertebra, possibly osteolytic metastasis to the skeleton. 3. Centrilobular emphysema with bilateral upper lobe bronchitis, small right pleural effusion, and patchy pulmonary disease, possibly infectious. 4. Aortic atherosclerosis (ICD10-I70.0) and Emphysema (ICD10-J43.9). 5. Advanced cervical degenerative disc disease. 6. Diffuse bone demineralization. Electronically Signed   By: Revonda Humphrey   On: 12/02/2019 19:04   CT Thoracic Spine Wo Contrast  Result Date: 12/02/2019 CLINICAL DATA:  Level 2 trauma.  Fall with back pain. EXAM: CT THORACIC SPINE WITHOUT CONTRAST TECHNIQUE: Multidetector CT images of the thoracic were obtained using  the standard protocol without intravenous contrast. Automatic exposure control utilized. COMPARISON:  CT chest dated August 13, 2019 FINDINGS: Alignment: Normal. Vertebrae: Interval T12 superior endplate intravertebral disc herniation, possibly acute. Chronic mild compression deformities of T12, and the lower cervical vertebra without an acute compression fracture or fracture  lucency. Redemonstration of abnormal diffuse permeative bone marrow pattern. Suspicious small lytic and sclerotic lesion measuring 6 mm in the T1 vertebra. Paraspinal and other soft tissues: Mildly enlarged precarinal lymph node measuring 12 mm short axis diameter, noncalcified. Cardiomegaly with pulmonary artery hypertension, cardiac pacemaker, coarse mitral annulus calcification and coarse coronary calcifications. Small right 7 Hounsfield unit pleural effusion. Trace left pleural fluid. Central pulmonary vascular congestion and patchy airspace and small airways disease in both lungs, bilateral bronchitis, centrilobular emphysema, and right middle lobe fibrosis, likely sequela from prior radiation therapy. Disc levels: No posttraumatic widening or disruption. Intervertebral vacuum disc phenomenon at T11-T12. IMPRESSION: Possibly acute superior endplate intravertebral disc herniation at T12. Other chronic degenerative osseous findings. Diffuse abnormal permeative bone marrow pattern, possibly aggressive osteopenia or a benign or malignant marrow infiltrating disease. Small suspicious 6 mm lytic and sclerotic lesion in the T1 vertebra, possibly metastatic. Nonspecific mildly enlarged mediastinal lymph node that could be benign or metastatic. Cardiomegaly and pulmonary artery hypertension with coarse coronary calcifications, trace left pleural effusion, small right pleural effusion, mild pulmonary edema, bilateral bronchitis and possible superimposed infectious pneumonitis. Sequela of prior radiation therapy in the right middle lobe. Thoracoabdominal aortic calcified atherosclerosis and emphysema. Electronically Signed   By: Revonda Humphrey   On: 12/02/2019 19:35   CT Lumbar Spine Wo Contrast  Result Date: 12/02/2019 CLINICAL DATA:  Level 2 trauma.  Fall with back pain. EXAM: CT LUMBAR SPINE WITHOUT CONTRAST TECHNIQUE: Multidetector CT imaging of the lumbar spine was performed without intravenous contrast  administration. Multiplanar CT image reconstructions were also generated. Automatic exposure control utilized. COMPARISON:  January 02, 2019. FINDINGS: Segmentation: Five non-rib-bearing lumbar vertebral bodies. Alignment: Grade 1 retrolisthesis of L3 on L4, unchanged and likely degenerative. Straightening of the lumbar lordosis. Vertebrae: Interval T12 small intravertebral disc herniation. Similar chronic mild 10% compression deformities of the T12 and L4 superior endplates. Diffuse permeative bone pattern. A 5 mm L2 and T11 suspicious lytic lesion. An L2 5 mm sclerotic lesion, unchanged. Paraspinal and other soft tissues: Centrilobular emphysema. Remote gastric banding with hiatal hernia. Mild bilateral hydronephrosis with mild perinephric and peri ureteral inflammatory change, possibly infectious or secondary to medical renal disease. Nonobstructing left and right renal calcifications. Benign splenic granulomatous calcification. No retroperitoneal hematoma. Aortic calcified atherosclerosis without an abdominal aortic aneurysm. Disc levels: No posttraumatic disruption. Diffuse mild and moderate acquired disc disease and hypertrophic bone spurring. IMPRESSION: Interval T12 superior endplate intravertebral disc herniation, which could be acute. No acute bony injury of the lumbar spine. Diffusely abnormal permeative bone marrow pattern concerning for aggressive osteopenia or a benign or malignant marrow infiltrating disease. A couple small lytic and sclerotic osseous lesions, stable compared to April 2020. Lumbar muscle spasm and chronic degenerative changes and acquired disc disease. Aortic calcified atherosclerosis and centrilobular emphysema. Mild bilateral hydronephrosis with perinephric and peri-ureteral inflammatory changes and nonobstructing bilateral renal calculi. Recommend correlation with urinalysis to exclude an upper urinary tract infection. Electronically Signed   By: Revonda Humphrey   On: 12/02/2019 19:20     DG Chest Port 1 View  Result Date: 12/02/2019 CLINICAL DATA:  Trauma. Fall from standing.  On anticoagulation. EXAM: PORTABLE CHEST 1 VIEW COMPARISON:  Radiograph  04/02/2019 FINDINGS: Borderline cardiomegaly. Post left atrial clipping. Mediastinal contours are unchanged. Left-sided pacemaker with leads unchanged in position. No focal airspace disease. No pneumothorax. No large pleural effusion. No pulmonary edema. Reverse right shoulder arthroplasty. No acute osseous abnormalities are seen. Surgical clips at the thoracic inlet. IMPRESSION: No evidence of acute traumatic injury to the thorax. Electronically Signed   By: Keith Rake M.D.   On: 12/02/2019 18:10   Impression/Plan   74 y.o. female who presented after a fall earlier today when she lost her balance. She was found to have a small hyperdense focus in right occipital lobe likely representing an intraparenchymal hemorrhage on head CT, although radiology also mentions possible hemorrhagic met as a consideration. On C spine imaging there are potentially lytic lesions at C3 and T1. Given the constellation of findings, the best option is to pursue MRI brain and C spine to further characterize these lesions. There is no role for acute NS intervention for either of these findings at present.  Will await MRI brain/C spine. Pacemaker will need to be turned off for MRI to be completed.  Also noted on CT T spine is possible T12 compression fracure, <10% height loss. She denies pain. ?Acuity of this fracture. Regardless, no role for NS intervention. If she develops back pain when up ambulating, can order a TLSO brace and treat as an acute fracture.  Patient to be admitted to [facility] for further work up. May need CT chest/abd/pelvis pending MRIs. Defer to [facility]. D/C Eliquis in setting of ICH.  Please call for any concerns.  Ferne Reus, PA-C [facility] [employer]

## 2019-12-03 ENCOUNTER — Inpatient Hospital Stay (HOSPITAL_COMMUNITY): Payer: Medicare Other

## 2019-12-03 ENCOUNTER — Ambulatory Visit: Payer: Medicare Other

## 2019-12-03 DIAGNOSIS — Z85850 Personal history of malignant neoplasm of thyroid: Secondary | ICD-10-CM

## 2019-12-03 DIAGNOSIS — I2584 Coronary atherosclerosis due to calcified coronary lesion: Secondary | ICD-10-CM

## 2019-12-03 DIAGNOSIS — M899 Disorder of bone, unspecified: Secondary | ICD-10-CM

## 2019-12-03 DIAGNOSIS — R9431 Abnormal electrocardiogram [ECG] [EKG]: Secondary | ICD-10-CM

## 2019-12-03 DIAGNOSIS — I340 Nonrheumatic mitral (valve) insufficiency: Secondary | ICD-10-CM

## 2019-12-03 DIAGNOSIS — Z8572 Personal history of non-Hodgkin lymphomas: Secondary | ICD-10-CM

## 2019-12-03 DIAGNOSIS — I4819 Other persistent atrial fibrillation: Principal | ICD-10-CM

## 2019-12-03 DIAGNOSIS — E890 Postprocedural hypothyroidism: Secondary | ICD-10-CM

## 2019-12-03 DIAGNOSIS — N1330 Unspecified hydronephrosis: Secondary | ICD-10-CM

## 2019-12-03 DIAGNOSIS — R590 Localized enlarged lymph nodes: Secondary | ICD-10-CM

## 2019-12-03 DIAGNOSIS — K639 Disease of intestine, unspecified: Secondary | ICD-10-CM

## 2019-12-03 DIAGNOSIS — J439 Emphysema, unspecified: Secondary | ICD-10-CM

## 2019-12-03 DIAGNOSIS — I2510 Atherosclerotic heart disease of native coronary artery without angina pectoris: Secondary | ICD-10-CM

## 2019-12-03 DIAGNOSIS — I10 Essential (primary) hypertension: Secondary | ICD-10-CM

## 2019-12-03 DIAGNOSIS — I361 Nonrheumatic tricuspid (valve) insufficiency: Secondary | ICD-10-CM

## 2019-12-03 DIAGNOSIS — R55 Syncope and collapse: Secondary | ICD-10-CM

## 2019-12-03 LAB — RENAL FUNCTION PANEL
Albumin: 2.9 g/dL — ABNORMAL LOW (ref 3.5–5.0)
Anion gap: 10 (ref 5–15)
BUN: 16 mg/dL (ref 8–23)
CO2: 23 mmol/L (ref 22–32)
Calcium: 8.8 mg/dL — ABNORMAL LOW (ref 8.9–10.3)
Chloride: 107 mmol/L (ref 98–111)
Creatinine, Ser: 0.91 mg/dL (ref 0.44–1.00)
GFR calc Af Amer: >60 mL/min (ref >60)
GFR calc non Af Amer: >60 mL/min (ref >60)
Glucose, Bld: 108 mg/dL — ABNORMAL HIGH (ref 70–99)
Phosphorus: 3.6 mg/dL (ref 2.5–4.6)
Potassium: 4.2 mmol/L (ref 3.5–5.1)
Sodium: 140 mmol/L (ref 135–145)

## 2019-12-03 LAB — ECHOCARDIOGRAM COMPLETE
Height: 66 in
Weight: 3622.6 oz

## 2019-12-03 LAB — IRON AND TIBC
Iron: 51 ug/dL (ref 28–170)
Saturation Ratios: 15 % (ref 10.4–31.8)
TIBC: 336 ug/dL (ref 250–450)
UIBC: 285 ug/dL

## 2019-12-03 LAB — CBC
HCT: 40 % (ref 36.0–46.0)
Hemoglobin: 12.8 g/dL (ref 12.0–15.0)
MCH: 29.6 pg (ref 26.0–34.0)
MCHC: 32 g/dL (ref 30.0–36.0)
MCV: 92.4 fL (ref 80.0–100.0)
Platelets: 180 10*3/uL (ref 150–400)
RBC: 4.33 MIL/uL (ref 3.87–5.11)
RDW: 14.3 % (ref 11.5–15.5)
WBC: 7.4 10*3/uL (ref 4.0–10.5)
nRBC: 0 % (ref 0.0–0.2)

## 2019-12-03 LAB — FERRITIN: Ferritin: 30 ng/mL (ref 11–307)

## 2019-12-03 LAB — MAGNESIUM: Magnesium: 1.7 mg/dL (ref 1.7–2.4)

## 2019-12-03 LAB — RETICULOCYTES
Immature Retic Fract: 16.7 % — ABNORMAL HIGH (ref 2.3–15.9)
RBC.: 4.28 MIL/uL (ref 3.87–5.11)
Retic Count, Absolute: 74.5 10*3/uL (ref 19.0–186.0)
Retic Ct Pct: 1.7 % (ref 0.4–3.1)

## 2019-12-03 LAB — VITAMIN B12: Vitamin B-12: 401 pg/mL (ref 180–914)

## 2019-12-03 LAB — FOLATE: Folate: 29.8 ng/mL (ref >5.9)

## 2019-12-03 MED ORDER — CALCIUM CARBONATE-VITAMIN D 500-200 MG-UNIT PO TABS
2.0000 | ORAL_TABLET | Freq: Every day | ORAL | Status: DC
  Administered 2019-12-04: 2 via ORAL
  Filled 2019-12-03: qty 2

## 2019-12-03 MED ORDER — MAGNESIUM SULFATE 2 GM/50ML IV SOLN
2.0000 g | Freq: Once | INTRAVENOUS | Status: AC
  Administered 2019-12-03: 2 g via INTRAVENOUS

## 2019-12-03 MED ORDER — GADOBUTROL 1 MMOL/ML IV SOLN
10.0000 mL | Freq: Once | INTRAVENOUS | Status: AC | PRN
  Administered 2019-12-03: 10 mL via INTRAVENOUS

## 2019-12-03 MED ORDER — AMIODARONE HCL 200 MG PO TABS
400.0000 mg | ORAL_TABLET | Freq: Two times a day (BID) | ORAL | Status: DC
  Administered 2019-12-03 – 2019-12-04 (×2): 400 mg via ORAL
  Filled 2019-12-03 (×2): qty 2

## 2019-12-03 MED ORDER — MAGNESIUM SULFATE 2 GM/50ML IV SOLN
2.0000 g | Freq: Once | INTRAVENOUS | Status: DC
  Filled 2019-12-03: qty 50

## 2019-12-03 NOTE — Progress Notes (Signed)
I have reviewed the MRI brain and C-spine. Questioned hyperdense focus seen on CT is not visualized on MRI. No evidence of acute hemorrhage or metastatic lesion. Neurosurgery will sign-off, please call with questions.

## 2019-12-03 NOTE — Progress Notes (Signed)
PROGRESS NOTE  Kayla Harrison UEA:[id number] DOB: October 21, 1945   PCP: [name], MD  Patient is from: Home.  Independently ambulates at baseline.  DOA: 12/02/2019 LOS: 1  Brief Narrative / Interim history: 74 year old female with history of A. Fib/flutter s/p PPM and on Eliquis, NHL s/p chemo in 2018, SVC syndrome, thyroid Ca s/p thyroidectomy, hepatitis and hypertension presenting with recurrent fall in the setting of A. fib with RVR.   Patient had A. fib and cardioverted 2 weeks ago.  She had syncopal episode in the setting of A. fib while taking a shower the day prior to presentation.  Evaluated by EMS but was not brought to ED. She had another fall and hitting the back of the head while walking on the day of admission.  No LOC with the later.  In ED, in A. fib with RVR with HR in 120s.  BP 190/100.  Afebrile.  100% on room air. Cr 1.07.  Hgb 11.8.  Otherwise CBC and CMP without significant finding.  PT/INR 17.7/1.5. HS Trop negative x2.  Alcohol level negative.  EKG with A. fib at 112 and QTc of 510 but no acute ischemic finding.  COVID-19 screen negative.  CXR without acute finding.  CT head revealed a small 7 mm hyperdense focus in the right occipital lobe concerning for hemorrhage or metastatic lesion.  CT cervical/thoracic/lumbar spine concerning for lytic lesion in T1 and C3 vertebra, possibly an acute superior endplate intervertebral disc herniation at T12, DDD, diffuse osteopenia, mildly enlarged mediastinal LAD, cardiomegaly, pulm HTN, mild bilateral hydro and perinephric changes.   Neurosurgery consulted and recommended MRI brain and cervical spine.  Cardiology consulted for PPM interrogation and A. fib with RVR.  Started on Cardizem drip.  Anticoagulation held.   Subjective: No major events overnight or this morning.  He states she had headache that has improved with fentanyl and Tylenol.  Headache now 3/10.  Mainly over her occipital area where she has the impact.  Denies  chest pain, dyspnea, GI or UTI symptoms.  Denies vision change, focal numbness, tingling or weakness.  Denies dizziness or lightheadedness.  Objective: Vitals:   12/03/19 0500 12/03/19 0535 12/03/19 0540 12/03/19 0733  BP: 127/80   120/75  Pulse: (!) 101 (!) 101  (!) 104  Resp: (!) 21 16  20   Temp: 98.3 F (36.8 C)   98.1 F (36.7 C)  TempSrc: Oral   Oral  SpO2: 94% 94%  95%  Weight:   102.7 kg   Height:        Intake/Output Summary (Last 24 hours) at 12/03/2019 1107 Last data filed at 12/03/2019 0743 Gross per 24 hour  Intake 100 ml  Output 1100 ml  Net -1000 ml   Filed Weights   12/02/19 1856 12/03/19 0540  Weight: 99.8 kg 102.7 kg    Examination:  GENERAL: No acute distress.  Appears well.  HEENT: MMM.  Vision and hearing grossly intact.  NECK: Supple.  No apparent JVD.  RESP:  No IWOB. Good air movement bilaterally. CVS: Regular rhythm.  HR 120. Heart sounds normal.  ABD/GI/GU: Bowel sounds present. Soft. Non tender.  MSK/EXT:  Moves extremities. No apparent deformity. No edema.  SKIN: no apparent skin lesion or wound NEURO: Awake, alert and oriented appropriately.  CN II-XII intact.  Motor 5/5 in all extremities.  Light sensation intact.  Rapid alternating movements intact.  Patellar reflex symmetric. PSYCH: Calm. Normal affect.  Procedures:  None  Assessment & Plan: Recurrent falls/syncope due to  A. fib/flutter with RVR.  Patient with PPM.  On amiodarone and Eliquis at home.  HR in 110s to 120s.  She also has QTC to 510 which could be exaggerated by RVR.  TSH within normal.  PPM interrogation showed multiple episodes of AF. -Cardiology managing-on amiodarone 400 mg twice daily -Eliquis/anticoagulation on hold. -Check echocardiogram  Possible ICH vs. hemorrhagic metastatic lesion of the brain: CT head showed small 7 mm hyperdense focus in the right occipital lobe.  Neuro exam was normal. -Anticoagulation on hold. -MRI brain per NS-pending  Possible lytic  lesions in the C3 and T1 vertebra-noted on cervical and thoracic CT-patient with history of NHL s/p chemo in 2018, thyroid ca s/p thyroidectomy.  Also with diffuse lymphadenopathy and bowel wall thickening -Follow MRI cervical spine-Hope to include T1 as well.  Possible T2 compression fracture/intervertebral discoordination: noted on thoracic and lumbar CT. neuro exam within normal.  No bowel or bladder issues.   -TLSO brace as needed for pain per NS.   -PT/OT  Osteopenia/possible osteoporosis -Start calcium and vitamin D supplementation -Could benefit from bisphosphonates or other agents for osteoporosis  Mild bilateral hydro-/perinephric stranding-noted on CT. Patient without UTI symptoms, fever, leukocytosis, suprapubic or CVA tenderness.  UA not convincing. -Continue monitoring -Follow urine culture  Prolonged QTc: QTC 510 but could be exaggerated by RVR. -Keep K above 4.0 and Mg above 2.0. -Avoid QT prolonging drugs  Mesenteric/mediastinal and periportal lymphadenopathy/pulmonary nodules/osseous lesions and diffusely thickened duodenal and jejunal wall-concerning given her history  History of NHL: s/p chemotherapy in 2018 -Will contact Dr. Lindi Adie, patient's oncologist  Hypothyroidism in patient with thyroid cancer s/p thyroidectomy:  -Check TSH -Continue home Synthroid  Hypertension:  Elevated to 190/100 on presentation.  Normotensive now. -Cardiac meds as above  Emphysema/possible pulmonary HTN/coronary calcification -Echocardiogram, A1c and lipid panel                 DVT prophylaxis: SCD Code Status: Full code Family Communication: Patient and/or RN. Available if any question.  Discharge barrier: A. fib/flutter with RVR and evaluation for possible ICH and metastatic lesions Patient is from: Home Final disposition: Likely home once medically stable and cleared by consultants  Consultants: Neurosurgery, cardiology and oncology   Microbiology  summarized: [id number] negative. Urine culture pending  Sch Meds:  Scheduled Meds: . amiodarone  400 mg Oral BID  . levothyroxine  137 mcg Oral Once per day on Sun Mon Tue Wed Thu Fri  . multivitamin with minerals  1 tablet Oral Q breakfast  . [START ON 12/06/2019] rosuvastatin  10 mg Oral Once per day on Mon Thu   Continuous Infusions: PRN Meds:.acetaminophen **OR** acetaminophen, fentaNYL (SUBLIMAZE) injection, labetalol  Antimicrobials: Anti-infectives (From admission, onward)   None       I have personally reviewed the following labs and images: CBC: Recent Labs  Lab 12/02/19 1732 12/02/19 1751 12/03/19 0339  WBC 5.4  --  7.4  HGB 11.8* 12.6 12.8  HCT 38.5 37.0 40.0  MCV 97.0  --  92.4  PLT 167  --  180   BMP &GFR Recent Labs  Lab 12/02/19 1732 12/02/19 1751 12/02/19 2002 12/03/19 0339  NA 141 140  --  140  K 4.3 4.1  --  4.2  CL 110 112*  --  107  CO2 20*  --   --  23  GLUCOSE 108* 106*  --  108*  BUN 22 22  --  16  CREATININE 1.07* 1.00  --  0.91  CALCIUM 8.5*  --   --  8.8*  MG  --   --  2.1 1.7  PHOS  --   --   --  3.6   Estimated Creatinine Clearance: 64.7 mL/min (by C-G formula based on SCr of 0.91 mg/dL). Liver & Pancreas: Recent Labs  Lab 12/02/19 1732 12/03/19 0339  AST 18  --   ALT 19  --   ALKPHOS 80  --   BILITOT 0.5  --   PROT 5.8*  --   ALBUMIN 3.0* 2.9*   No results for input(s): LIPASE, AMYLASE in the last 168 hours. No results for input(s): AMMONIA in the last 168 hours. Diabetic: No results for input(s): HGBA1C in the last 72 hours. No results for input(s): GLUCAP in the last 168 hours. Cardiac Enzymes: No results for input(s): CKTOTAL, CKMB, CKMBINDEX, TROPONINI in the last 168 hours. No results for input(s): PROBNP in the last 8760 hours. Coagulation Profile: Recent Labs  Lab 12/02/19 1732  INR 1.5*   Thyroid Function Tests: No results for input(s): TSH, T4TOTAL, FREET4, T3FREE, THYROIDAB in the last 72 hours. Lipid  Profile: No results for input(s): CHOL, HDL, LDLCALC, TRIG, CHOLHDL, LDLDIRECT in the last 72 hours. Anemia Panel: Recent Labs    12/03/19 0339  VITAMINB12 401  FOLATE 29.8  FERRITIN 30  TIBC 336  IRON 51  RETICCTPCT 1.7   Urine analysis:    Component Value Date/Time   COLORURINE YELLOW 12/02/2019 1920   APPEARANCEUR CLEAR 12/02/2019 1920   LABSPEC 1.012 12/02/2019 1920   PHURINE 7.0 12/02/2019 1920   GLUCOSEU NEGATIVE 12/02/2019 1920   HGBUR SMALL (A) 12/02/2019 1920   BILIRUBINUR NEGATIVE 12/02/2019 1920   KETONESUR NEGATIVE 12/02/2019 1920   PROTEINUR NEGATIVE 12/02/2019 1920   NITRITE NEGATIVE 12/02/2019 1920   LEUKOCYTESUR SMALL (A) 12/02/2019 1920   Sepsis Labs: Invalid input(s): PROCALCITONIN, [place]  Microbiology: Recent Results (from the past 240 hour(s))  SARS CORONAVIRUS 2 (TAT 6-24 HRS) Nasopharyngeal Nasopharyngeal Swab     Status: None   Collection Time: 12/02/19  6:17 PM   Specimen: Nasopharyngeal Swab  Result Value Ref Range Status   SARS Coronavirus 2 NEGATIVE NEGATIVE Final    Comment: (NOTE) SARS-CoV-2 target nucleic acids are NOT DETECTED. The SARS-CoV-2 RNA is generally detectable in upper and lower respiratory specimens during the acute phase of infection. Negative results do not preclude SARS-CoV-2 infection, do not rule out co-infections with other pathogens, and should not be used as the sole basis for treatment or other patient management decisions. Negative results must be combined with clinical observations, patient history, and epidemiological information. The expected result is Negative. Fact Sheet for Patients: SugarRoll.be Fact Sheet for Healthcare Providers: https://www.woods-mathews.com/ This test is not yet approved or cleared by the [place] FDA and  has been authorized for detection and/or diagnosis of SARS-CoV-2 by FDA under an Emergency Use Authorization (EUA). This EUA will  remain  in effect (meaning this test can be used) for the duration of the COVID-19 declaration under Section 56 4(b)(1) of the Act, 21 U.S.C. section 360bbb-3(b)(1), unless the authorization is terminated or revoked sooner. Performed at [facility], [place] [address], [place], [place] [address]     Radiology Studies: CT Head Wo Contrast  Result Date: 12/02/2019 CLINICAL DATA:  Level 2 trauma. Fall hitting the back of the head. EXAM: CT HEAD WITHOUT CONTRAST TECHNIQUE: Contiguous axial images were obtained from the base of the skull through the vertex without intravenous contrast. Automatic  exposure control utilized. COMPARISON:  January 02, 2019 FINDINGS: Brain: A hyperdense 7 mm focus in the right occipital lobe cortical gray matter on series 3, image 15 and series 5, image 61 which is new and concerning for an acute intraparenchymal hemorrhage. A chronic linear density in the left temporal convexity on series 3, image 13 is unchanged and probably vascular in etiology. There is no abnormal extra-axial fluid collection, midline shift, mass effect, vascular territory distribution infarction. Chronic left caudate lacunar infarction and bilateral cerebral microvascular white matter ischemic changes are similar. There is redemonstration of basal ganglia calcifications. Vascular: Atherosclerotic calcifications including along a right sylvian fissure and anterior parafalcine vessel. Skull: No acute skull fracture. No subgaleal hematoma. Sinuses/Orbits: Mild bilateral frontal and ethmoidal mucosal thickening. No posttraumatic air-fluid levels. Normal bilateral orbital soft tissues. Other: Metallic dental artifact degrades image quality, particularly in the posterior fossa. I discussed critical Value/emergent results and the need for surveillance imaging by telephone at the time of interpretation on 12/02/2019 at 6:45 pm with provider [facility] , who verbally acknowledged these results. IMPRESSION: A small  7 mm hyperdense focus in the right occipital lobe is concerning for an acute intraparenchymal hemorrhage. An underlying hemorrhagic metastatic lesion is a differential consideration. Surveillance CT brain without and with intravenous contrast recommended in 2-3 months for further evaluation given the cardiac pacemaker. No acute skull fracture. Chronic microvascular ischemic changes in both cerebral hemispheres and left lacunar infarction. Electronically Signed   By: Revonda Humphrey   On: 12/02/2019 18:46   CT CHEST W CONTRAST  Result Date: 12/03/2019 CLINICAL DATA:  Lymphoma. EXAM: CT CHEST, ABDOMEN, AND PELVIS WITH CONTRAST TECHNIQUE: Multidetector CT imaging of the chest, abdomen and pelvis was performed following the standard protocol during bolus administration of intravenous contrast. Automatic exposure control utilized. CONTRAST:  173mL OMNIPAQUE IOHEXOL 300 MG/ML  SOLN COMPARISON:  August 10 2018. CT imaging of the cervical, thoracic and lumbar spine earlier on the same date. FINDINGS: CT CHEST FINDINGS Cardiovascular: Mild cardiomegaly with coarse aortic valvular calcification and atrioventricular cardiac pacemaker with left-sided battery pack. Pulmonary trunk measuring 3.3 cm. Coarse long segment LAD and left circumflex coronary calcifications. Mild left main coronary and moderate right main coronary calcification. Mediastinum/Nodes: Thyroidectomy surgical clips. Redemonstration a mildly enlarged mediastinal lymph node measuring 12 mm short axis diameter, noncalcified. Small hiatal hernia through a gastric lap band. No adenopathy of the bilateral axilla or lower neck or chest wall. Lungs/Pleura: Similar sizes of a small right and trace left pleural effusion compared to earlier on the same date. Fibrotic bronchiectasis in the medial right upper and middle lobes, likely sequela of remote radiation therapy. Mild centrilobular emphysema. Patchy ground-glass opacities in both lungs. A couple bilateral small  pulmonary nodules measuring up to 6 mm similar to November 2019. A right basilar 14 mm and 13 mm nodule which were present on the 2019 study are not re-evaluated, at least partially obscured by the pleural effusion, which could be malignant. Musculoskeletal: Redemonstration of diffusely abnormal permeative bone pattern throughout the skeleton and several small lytic and sclerotic osseous lesions reported on spinal imaging performed earlier today. Right shoulder arthroplasty. CT ABDOMEN PELVIS FINDINGS Hepatobiliary: Borderline hepatomegaly. Chronic mild intrahepatic biliary duct dilatation measuring up to 11 mm and chronic 17 mm proximal common bile duct with normal distal tapering and no calcified choledocholithiasis. Stable size of a 13 mm simple hepatic cyst. Normal smooth hepatic contours with a couple benign granulomatous calcifications. Surgically absent or decompressed gallbladder. Pancreas: Questionable pancreatic  divisum, coronal series 6, image 62. Diffuse mild atrophy. No acute pancreatitis. No apparent dilatation of the main pancreatic duct. Spleen: Normally sized spleen with benign granulomatous calcifications. 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 Adrenals/Urinary Tract: Mild thickening of the bilateral adrenal glands without overt nodularity. Normal sizes of both kidneys. Nonobstructing bilateral renal calculi. Symmetrical medical renal disease. Abnormal enhancement of the bilateral ureteral uroepithelium and improved but persistent bilateral hydronephrosis. No right or left ureteral calculus. Stomach/Bowel: Gastric lap band placement, the band appropriately positioned at a 45 degree angle to the horizontal. No evidence of gastric erosion. Mostly decompressed gastric and small bowel with circumferential moderate mucosal thickening of the duodenum and proximal jejunum concerning for lymphomatous infiltration given the lack of adjacent mesenteric edema. Distal colonic  diverticulosis. Large amount of stool in the colon and rectum. Surgically absent or decompressed appendix. Vascular/Lymphatic: Thoracoabdominal aorto bi-iliac calcified atherosclerosis without aneurysmal dilatation. Small scattered retroperitoneal lymph nodes. A mildly enlarged periportal node measuring 20 x 14 mm. An abnormally increased number of clustered tiny mesenteric lymph nodes most confluent at the mesenteric root and lesser sac where there is subtle fat stranding, consistent with the patient history of lymphoma. Reproductive: Hysterectomy.  No adnexal cyst or mass. Other: A subcutaneous fat plane ovoid soft tissue with central calcification abutting the dermal surface without adjacent inflammatory change, probably a benign sebaceous cyst in the midline lower back soft tissues measuring 13 mm. Musculoskeletal: Redemonstration of diffusely abnormal permeative bone pattern throughout the skeleton and several small lytic and sclerotic osseous lesions reported on spinal imaging performed earlier today. IMPRESSION: An abnormal number of clustered small lymph nodes in the mesenteric root and lesser sac and mesenteric misting in the lesser sac. Mildly enlarged mediastinal and periportal lymph node. Abnormal diffusely thickened duodenal and proximal jejunal bowel wall. Diffuse permeative bone pattern with several small suspicious osseous lesions. These findings are consistent with the patient history of lymphoma. A small right pleural effusion and trace left pleural effusion, possibly bland or malignant. The former obscuring reevaluation of the prior suspicious 14 mm and 13 mm right basilar nodules. Stable left basilar 7 mm subsolid nodule. An additional number of smaller bilateral pulmonary nodules, some probably sequela of benign granulomatous disease. A concommitant lymphomatous plaque etiology, primary lung cancer, or metastatic nodule are also differential considerations. Benign granulomatous disease of the  spleen and liver. Gastric lap band appropriate placement with small hiatal herniation, but no evidence of gastric erosion. Improved but persistent bilateral mild hydronephrosis with abnormal enhancement of the bilateral ureteral uroepithelium concerning for an acute upper urinary tract infection. Emphysema and aortic calcified atherosclerosis. 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 Mild cardiomegaly with four-vessel coronary calcification, severe along the LAD and left circumflex arteries. Pulmonary artery hypertension. Chronic prominent biliary dilatation without apparent dilatation of the main pancreatic duct. Possible pancreatic divisum. Small subdermal lesion of the midline lower back, possibly a benign sebaceous cyst. Electronically Signed   By: Revonda Humphrey   On: 12/03/2019 00:33   CT Cervical Spine Wo Contrast  Result Date: 12/02/2019 CLINICAL DATA:  Level 2 trauma. Fall with neck pain. EXAM: CT CERVICAL SPINE WITHOUT CONTRAST TECHNIQUE: Multidetector CT imaging of the cervical spine was performed without intravenous contrast. Multiplanar CT image reconstructions were also generated. COMPARISON:  July 29, 2018 FINDINGS: Alignment: Degenerative appearing grade 1 retrolisthesis of C5 on C6 and C6 on C7. Reversal of the cervical lordosis. Skull base and vertebrae: No acute fracture. Degenerative anterior wedging and endplate  moderate hypertrophy of the mid cervical levels. Soft tissues and spinal canal: No prevertebral fluid or swelling. No visible canal hematoma. Disc levels: No posttraumatic disruption of the disc spaces. Diffuse cervical degenerative disc space narrowing, advanced from C5 through C7. Upper chest: Centrilobular emphysema with bilateral upper lobe bronchitis, small right 4 Hounsfield unit dependent pleural effusion, and patchy ground-glass opacity and airspace disease in the medial upper lobes, possibly infectious. Aortic arch calcified atherosclerosis.  Thyroidectomy surgical clips. Bilateral carotid calcified atherosclerosis. Partial imaging of a cardiac pacemaker. A benign granulomatous calcification in the left lung apex. Other: Diffuse bone demineralization. A 6 mm T1 and a 5 mm C3 ovoid lytic lesion, concerning for possible metastatic disease to the skeleton. IMPRESSION: 1. No acute fracture or malalignment of the cervical spine. 2. Two suspicious small lytic lesions in the T1 and C3 vertebra, possibly osteolytic metastasis to the skeleton. 3. Centrilobular emphysema with bilateral upper lobe bronchitis, small right pleural effusion, and patchy pulmonary disease, possibly infectious. 4. Aortic atherosclerosis (ICD10-I70.0) and Emphysema (ICD10-J43.9). 5. Advanced cervical degenerative disc disease. 6. Diffuse bone demineralization. Electronically Signed   By: Revonda Humphrey   On: 12/02/2019 19:04   CT Thoracic Spine Wo Contrast  Result Date: 12/02/2019 CLINICAL DATA:  Level 2 trauma.  Fall with back pain. EXAM: CT THORACIC SPINE WITHOUT CONTRAST TECHNIQUE: Multidetector CT images of the thoracic were obtained using the standard protocol without intravenous contrast. Automatic exposure control utilized. COMPARISON:  CT chest dated August 13, 2019 FINDINGS: Alignment: Normal. Vertebrae: Interval T12 superior endplate intravertebral disc herniation, possibly acute. Chronic mild compression deformities of T12, and the lower cervical vertebra without an acute compression fracture or fracture lucency. Redemonstration of abnormal diffuse permeative bone marrow pattern. Suspicious small lytic and sclerotic lesion measuring 6 mm in the T1 vertebra. Paraspinal and other soft tissues: Mildly enlarged precarinal lymph node measuring 12 mm short axis diameter, noncalcified. Cardiomegaly with pulmonary artery hypertension, cardiac pacemaker, coarse mitral annulus calcification and coarse coronary calcifications. Small right 7 Hounsfield unit pleural effusion. Trace  left pleural fluid. Central pulmonary vascular congestion and patchy airspace and small airways disease in both lungs, bilateral bronchitis, centrilobular emphysema, and right middle lobe fibrosis, likely sequela from prior radiation therapy. Disc levels: No posttraumatic widening or disruption. Intervertebral vacuum disc phenomenon at T11-T12. IMPRESSION: Possibly acute superior endplate intravertebral disc herniation at T12. Other chronic degenerative osseous findings. Diffuse abnormal permeative bone marrow pattern, possibly aggressive osteopenia or a benign or malignant marrow infiltrating disease. Small suspicious 6 mm lytic and sclerotic lesion in the T1 vertebra, possibly metastatic. Nonspecific mildly enlarged mediastinal lymph node that could be benign or metastatic. Cardiomegaly and pulmonary artery hypertension with coarse coronary calcifications, trace left pleural effusion, small right pleural effusion, mild pulmonary edema, bilateral bronchitis and possible superimposed infectious pneumonitis. Sequela of prior radiation therapy in the right middle lobe. Thoracoabdominal aortic calcified atherosclerosis and emphysema. Electronically Signed   By: Revonda Humphrey   On: 12/02/2019 19:35   CT Lumbar Spine Wo Contrast  Result Date: 12/02/2019 CLINICAL DATA:  Level 2 trauma.  Fall with back pain. EXAM: CT LUMBAR SPINE WITHOUT CONTRAST TECHNIQUE: Multidetector CT imaging of the lumbar spine was performed without intravenous contrast administration. Multiplanar CT image reconstructions were also generated. Automatic exposure control utilized. COMPARISON:  January 02, 2019. FINDINGS: Segmentation: Five non-rib-bearing lumbar vertebral bodies. Alignment: Grade 1 retrolisthesis of L3 on L4, unchanged and likely degenerative. Straightening of the lumbar lordosis. Vertebrae: Interval T12 small intravertebral disc herniation. Similar chronic  mild 10% compression deformities of the T12 and L4 superior endplates.  Diffuse permeative bone pattern. A 5 mm L2 and T11 suspicious lytic lesion. An L2 5 mm sclerotic lesion, unchanged. Paraspinal and other soft tissues: Centrilobular emphysema. Remote gastric banding with hiatal hernia. Mild bilateral hydronephrosis with mild perinephric and peri ureteral inflammatory change, possibly infectious or secondary to medical renal disease. Nonobstructing left and right renal calcifications. Benign splenic granulomatous calcification. No retroperitoneal hematoma. Aortic calcified atherosclerosis without an abdominal aortic aneurysm. Disc levels: No posttraumatic disruption. Diffuse mild and moderate acquired disc disease and hypertrophic bone spurring. IMPRESSION: Interval T12 superior endplate intravertebral disc herniation, which could be acute. No acute bony injury of the lumbar spine. Diffusely abnormal permeative bone marrow pattern concerning for aggressive osteopenia or a benign or malignant marrow infiltrating disease. A couple small lytic and sclerotic osseous lesions, stable compared to April 2020. Lumbar muscle spasm and chronic degenerative changes and acquired disc disease. Aortic calcified atherosclerosis and centrilobular emphysema. Mild bilateral hydronephrosis with perinephric and peri-ureteral inflammatory changes and nonobstructing bilateral renal calculi. Recommend correlation with urinalysis to exclude an upper urinary tract infection. Electronically Signed   By: Revonda Humphrey   On: 12/02/2019 19:20   CT ABDOMEN PELVIS W CONTRAST  Result Date: 12/03/2019 CLINICAL DATA:  Lymphoma. EXAM: CT CHEST, ABDOMEN, AND PELVIS WITH CONTRAST TECHNIQUE: Multidetector CT imaging of the chest, abdomen and pelvis was performed following the standard protocol during bolus administration of intravenous contrast. Automatic exposure control utilized. CONTRAST:  141mL OMNIPAQUE IOHEXOL 300 MG/ML  SOLN COMPARISON:  August 10 2018. CT imaging of the cervical, thoracic and lumbar spine  earlier on the same date. FINDINGS: CT CHEST FINDINGS Cardiovascular: Mild cardiomegaly with coarse aortic valvular calcification and atrioventricular cardiac pacemaker with left-sided battery pack. Pulmonary trunk measuring 3.3 cm. Coarse long segment LAD and left circumflex coronary calcifications. Mild left main coronary and moderate right main coronary calcification. Mediastinum/Nodes: Thyroidectomy surgical clips. Redemonstration a mildly enlarged mediastinal lymph node measuring 12 mm short axis diameter, noncalcified. Small hiatal hernia through a gastric lap band. No adenopathy of the bilateral axilla or lower neck or chest wall. Lungs/Pleura: Similar sizes of a small right and trace left pleural effusion compared to earlier on the same date. Fibrotic bronchiectasis in the medial right upper and middle lobes, likely sequela of remote radiation therapy. Mild centrilobular emphysema. Patchy ground-glass opacities in both lungs. A couple bilateral small pulmonary nodules measuring up to 6 mm similar to November 2019. A right basilar 14 mm and 13 mm nodule which were present on the 2019 study are not re-evaluated, at least partially obscured by the pleural effusion, which could be malignant. Musculoskeletal: Redemonstration of diffusely abnormal permeative bone pattern throughout the skeleton and several small lytic and sclerotic osseous lesions reported on spinal imaging performed earlier today. Right shoulder arthroplasty. CT ABDOMEN PELVIS FINDINGS Hepatobiliary: Borderline hepatomegaly. Chronic mild intrahepatic biliary duct dilatation measuring up to 11 mm and chronic 17 mm proximal common bile duct with normal distal tapering and no calcified choledocholithiasis. Stable size of a 13 mm simple hepatic cyst. Normal smooth hepatic contours with a couple benign granulomatous calcifications. Surgically absent or decompressed gallbladder. Pancreas: Questionable pancreatic divisum, coronal series 6, image 62.  Diffuse mild atrophy. No acute pancreatitis. No apparent dilatation of the main pancreatic duct. Spleen: Normally sized spleen with benign granulomatous calcifications. 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 Adrenals/Urinary Tract: Mild thickening of the bilateral adrenal glands without overt nodularity. Normal sizes  of both kidneys. Nonobstructing bilateral renal calculi. Symmetrical medical renal disease. Abnormal enhancement of the bilateral ureteral uroepithelium and improved but persistent bilateral hydronephrosis. No right or left ureteral calculus. Stomach/Bowel: Gastric lap band placement, the band appropriately positioned at a 45 degree angle to the horizontal. No evidence of gastric erosion. Mostly decompressed gastric and small bowel with circumferential moderate mucosal thickening of the duodenum and proximal jejunum concerning for lymphomatous infiltration given the lack of adjacent mesenteric edema. Distal colonic diverticulosis. Large amount of stool in the colon and rectum. Surgically absent or decompressed appendix. Vascular/Lymphatic: Thoracoabdominal aorto bi-iliac calcified atherosclerosis without aneurysmal dilatation. Small scattered retroperitoneal lymph nodes. A mildly enlarged periportal node measuring 20 x 14 mm. An abnormally increased number of clustered tiny mesenteric lymph nodes most confluent at the mesenteric root and lesser sac where there is subtle fat stranding, consistent with the patient history of lymphoma. Reproductive: Hysterectomy.  No adnexal cyst or mass. Other: A subcutaneous fat plane ovoid soft tissue with central calcification abutting the dermal surface without adjacent inflammatory change, probably a benign sebaceous cyst in the midline lower back soft tissues measuring 13 mm. Musculoskeletal: Redemonstration of diffusely abnormal permeative bone pattern throughout the skeleton and several small lytic and sclerotic osseous lesions  reported on spinal imaging performed earlier today. IMPRESSION: An abnormal number of clustered small lymph nodes in the mesenteric root and lesser sac and mesenteric misting in the lesser sac. Mildly enlarged mediastinal and periportal lymph node. Abnormal diffusely thickened duodenal and proximal jejunal bowel wall. Diffuse permeative bone pattern with several small suspicious osseous lesions. These findings are consistent with the patient history of lymphoma. A small right pleural effusion and trace left pleural effusion, possibly bland or malignant. The former obscuring reevaluation of the prior suspicious 14 mm and 13 mm right basilar nodules. Stable left basilar 7 mm subsolid nodule. An additional number of smaller bilateral pulmonary nodules, some probably sequela of benign granulomatous disease. A concommitant lymphomatous plaque etiology, primary lung cancer, or metastatic nodule are also differential considerations. Benign granulomatous disease of the spleen and liver. Gastric lap band appropriate placement with small hiatal herniation, but no evidence of gastric erosion. Improved but persistent bilateral mild hydronephrosis with abnormal enhancement of the bilateral ureteral uroepithelium concerning for an acute upper urinary tract infection. Emphysema and aortic calcified atherosclerosis. 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 Mild cardiomegaly with four-vessel coronary calcification, severe along the LAD and left circumflex arteries. Pulmonary artery hypertension. Chronic prominent biliary dilatation without apparent dilatation of the main pancreatic duct. Possible pancreatic divisum. Small subdermal lesion of the midline lower back, possibly a benign sebaceous cyst. Electronically Signed   By: Revonda Humphrey   On: 12/03/2019 00:33   DG Chest Port 1 View  Result Date: 12/02/2019 CLINICAL DATA:  Trauma. Fall from standing.  On anticoagulation. EXAM: PORTABLE CHEST 1 VIEW  COMPARISON:  Radiograph 04/02/2019 FINDINGS: Borderline cardiomegaly. Post left atrial clipping. Mediastinal contours are unchanged. Left-sided pacemaker with leads unchanged in position. No focal airspace disease. No pneumothorax. No large pleural effusion. No pulmonary edema. Reverse right shoulder arthroplasty. No acute osseous abnormalities are seen. Surgical clips at the thoracic inlet. IMPRESSION: No evidence of acute traumatic injury to the thorax. Electronically Signed   By: Keith Rake M.D.   On: 12/02/2019 18:10   50 minutes with more than 50% spent in reviewing records, counseling patient/family and coordinating care.   Arin Peral T. Dana  If 7PM-7AM, please contact night-coverage [email or web address] Password [facility] 12/03/2019, 11:07  AM

## 2019-12-03 NOTE — Progress Notes (Signed)
  NEUROSURGERY PROGRESS NOTE   No issues overnight.  Overall, feels improved with improved HA  No new N/T/W  EXAM:  BP 120/75   Pulse (!) 104   Temp 98.1 F (36.7 C) (Oral)   Resp 20   Ht '5\' 6"'$  (1.676 m)   Wt 102.7 kg   SpO2 95%   BMI 36.54 kg/m   Awake, alert, oriented  Speech fluent, appropriate  CN grossly intact  5/5 BUE/BLE  No drift  IMPRESSION/PLAN 74 y.o. female with mechanical fall yesterday noted to have Goessel vs met and ?cerival and thoracic lytic lesions. She is neurologically intact. - Awaiting MRI brain/C spine

## 2019-12-03 NOTE — Progress Notes (Signed)
  Echocardiogram 2D Echocardiogram has been performed.  Kayla Harrison 12/03/2019, 4:24 PM

## 2019-12-03 NOTE — Progress Notes (Signed)
PT Cancellation Note  Patient Details Name: Kayla Harrison MRN: [id number] DOB: Oct 15, 1945   Cancelled Treatment:    Reason Eval/Treat Not Completed: Patient at procedure or test/unavailable. Pt off floor at this time will f/u at a later time to complete PT eval.  Horald Chestnut, PT    Delford Field 12/03/2019, 12:50 PM

## 2019-12-03 NOTE — Progress Notes (Signed)
PT Cancellation Note  Patient Details Name: Kayla Harrison MRN: [id number] DOB: 04/20/1946   Cancelled Treatment:    Reason Eval/Treat Not Completed: Patient at procedure or test/unavailable Attempted to see pt again this pm but pt was having bed side study completed. Will f/u at next available time.   Horald Chestnut, PT    Delford Field 12/03/2019, 4:21 PM

## 2019-12-03 NOTE — H&P (View-Only) (Signed)
ELECTROPHYSIOLOGY CONSULT NOTE    Patient ID: Kayla Harrison MRN: [id number], DOB/AGE: Jun 01, 1946 74 y.o.  Admit date: 12/02/2019 Date of Consult: 12/03/2019  Primary Physician: [name], MD Electrophysiologist: Caryl Comes  *She has another medical record number with full chart*  Patient Profile: Kayla Harrison is a 74 y.o. female with a history of multiple cancers, morbid obesity, hypothyroidism, HFpEF, OSA, atrial fibrillation and flutter s/p convergent ablation at [facility], and chronic orthostasis who is being seen today for the evaluation of atrial fibrillation and syncope at the request of Dr Neena Rhymes.  HPI:  Kayla Harrison is a 74 y.o. female with the above past medical history.  She underwent cardioversion 10 days ago with significant symptomatic improvement.  On Tuesday evening, she went back into atrial fibrillation. She was taking a shower Wednesday morning and had her typical orthostasis prodrome and awoke sitting on the floor.  She called EMS and had to scoot across the floor to open the door.  She did not go to the hospital. She went grocery shopping yesterday and went to her sister's house afterwards. She walked outside with her walker and her legs gave out and she fell backwards. She is clear that she did not pass out yesterday.  She came to the ER by EMS.  Imaging demonstrated ICH as well as concerning spots on spine that may be metastasis.  She is pending MRI scan later today for further evaluation.   She denies chest pain, dyspnea, PND, orthopnea, nausea, vomiting,  edema, weight gain, or early satiety.  See her other chart for medical and surgical history   Medications Prior to Admission  Medication Sig Dispense Refill Last Dose   acetaminophen (TYLENOL) 500 MG tablet Take 500-1,000 mg by mouth every 6 (six) hours as needed for mild pain or headache.   unk at unk   amiodarone (PACERONE) 200 MG tablet Take 200 mg by mouth daily.    12/02/2019 at 0700    ELIQUIS 5 MG TABS tablet Take 5 mg by mouth 2 (two) times daily.    12/02/2019 at 0700   furosemide (LASIX) 20 MG tablet Take 20 mg by mouth daily as needed for fluid or edema.    unk at unk   levothyroxine (SYNTHROID) 137 MCG tablet Take 137 mcg by mouth See admin instructions. Take 137 mcg by mouth in the morning before breakfast on Sun/Mon/Tues/Wed/Thurs/Fri (nothing on Saturdays)   12/02/2019 at 0700   Lifitegrast (XIIDRA) 5 % SOLN Place 1 drop into both eyes every evening.    12/01/2019 at pm   Multiple Vitamins-Minerals (ONE-A-DAY WOMENS 50+ ADVANTAGE) TABS Take 1 tablet by mouth daily with breakfast.   12/02/2019 at am   rosuvastatin (CRESTOR) 10 MG tablet Take 10 mg by mouth See admin instructions. Take 10 mg by mouth on Mondays and Thursdays   12/02/2019 at am    Inpatient Medications:   amiodarone  200 mg Oral Daily   levothyroxine  137 mcg Oral Once per day on Sun Mon Tue Wed Thu Fri   multivitamin with minerals  1 tablet Oral Q breakfast   [START ON 12/06/2019] rosuvastatin  10 mg Oral Once per day on Mon Thu    Allergies:  Allergies  Allergen Reactions   Rivaroxaban     Nose Bleed X 6 hrs ; packing in ER Nasal hemorrhage   Tape Other (See Comments)    SKIN IS THIN AND TEARS EASILY    Tikosyn [Dofetilide] Other (See Comments)    "  Long QT wave"    Epinephrine    Ketamine Other (See Comments)    Hallucinations     Social History   Socioeconomic History   Marital status: Single    Spouse name: Not on file   Number of children: Not on file   Years of education: Not on file   Highest education level: Not on file  Occupational History   Not on file  Tobacco Use   Smoking status: Not on file  Substance and Sexual Activity   Alcohol use: Not on file   Drug use: Not on file   Sexual activity: Not on file  Other Topics Concern   Not on file  Social History Narrative   Not on file   Social Determinants of Health   Financial Resource Strain:     Difficulty of Paying Living Expenses:   Food Insecurity:    Worried About [place] in the Last Year:    [place] in the Last Year:   Transportation Needs:    Film/video editor (Medical):    Lack of Transportation (Non-Medical):   Physical Activity:    Days of Exercise per Week:    Minutes of Exercise per Session:   Stress:    Feeling of Stress :   Social Connections:    Frequency of Communication with Friends and Family:    Frequency of Social Gatherings with Friends and Family:    Attends Religious Services:    Active Member of Clubs or Organizations:    Attends Music therapist:    Marital Status:   Intimate Partner Violence:    Fear of Current or Ex-Partner:    Emotionally Abused:    Physically Abused:    Sexually Abused:      See other chart for family history   Review of Systems: All other systems reviewed and are otherwise negative except as noted above.  Physical Exam: Vitals:   12/03/19 0500 12/03/19 0535 12/03/19 0540 12/03/19 0733  BP: 127/80   120/75  Pulse: (!) 101 (!) 101  (!) 104  Resp: (!) 21 16  20   Temp: 98.3 F (36.8 C)   98.1 F (36.7 C)  TempSrc: Oral   Oral  SpO2: 94% 94%  95%  Weight:   102.7 kg   Height:        GEN- The patient is well appearing, alert and oriented x 3 today.   HEENT: normocephalic, atraumatic; sclera clear, conjunctiva pink; hearing intact; oropharynx clear; neck supple Lungs- Clear to ausculation bilaterally, normal work of breathing.  No wheezes, rales, rhonchi Heart- Regular rate and rhythm, no murmurs, rubs or gallops GI- soft, non-tender, non-distended, bowel sounds present Extremities- no clubbing, cyanosis, or edema; DP/PT/radial pulses 2+ bilaterally MS- no significant deformity or atrophy Skin- warm and dry, no rash or lesion Psych- euthymic mood, full affect Neuro- strength and sensation are intact  Labs:   Lab Results  Component Value Date   WBC 7.4  12/03/2019   HGB 12.8 12/03/2019   HCT 40.0 12/03/2019   MCV 92.4 12/03/2019   PLT 180 12/03/2019    Recent Labs  Lab 12/02/19 1732 12/02/19 1732 12/02/19 1751  NA 141   < > 140  K 4.3   < > 4.1  CL 110   < > 112*  CO2 20*  --   --   BUN 22   < > 22  CREATININE 1.07*   < > 1.00  CALCIUM 8.5*  --   --   PROT 5.8*  --   --   BILITOT 0.5  --   --   ALKPHOS 80  --   --   ALT 19  --   --   AST 18  --   --   GLUCOSE 108*   < > 106*   < > = values in this interval not displayed.      Radiology/Studies: CT Head Wo Contrast  Result Date: 12/02/2019 CLINICAL DATA:  Level 2 trauma. Fall hitting the back of the head. EXAM: CT HEAD WITHOUT CONTRAST TECHNIQUE: Contiguous axial images were obtained from the base of the skull through the vertex without intravenous contrast. Automatic exposure control utilized. COMPARISON:  January 02, 2019 FINDINGS: Brain: A hyperdense 7 mm focus in the right occipital lobe cortical gray matter on series 3, image 15 and series 5, image 61 which is new and concerning for an acute intraparenchymal hemorrhage. A chronic linear density in the left temporal convexity on series 3, image 13 is unchanged and probably vascular in etiology. There is no abnormal extra-axial fluid collection, midline shift, mass effect, vascular territory distribution infarction. Chronic left caudate lacunar infarction and bilateral cerebral microvascular white matter ischemic changes are similar. There is redemonstration of basal ganglia calcifications. Vascular: Atherosclerotic calcifications including along a right sylvian fissure and anterior parafalcine vessel. Skull: No acute skull fracture. No subgaleal hematoma. Sinuses/Orbits: Mild bilateral frontal and ethmoidal mucosal thickening. No posttraumatic air-fluid levels. Normal bilateral orbital soft tissues. Other: Metallic dental artifact degrades image quality, particularly in the posterior fossa. I discussed critical Value/emergent results  and the need for surveillance imaging by telephone at the time of interpretation on 12/02/2019 at 6:45 pm with provider [facility] , who verbally acknowledged these results. IMPRESSION: A small 7 mm hyperdense focus in the right occipital lobe is concerning for an acute intraparenchymal hemorrhage. An underlying hemorrhagic metastatic lesion is a differential consideration. Surveillance CT brain without and with intravenous contrast recommended in 2-3 months for further evaluation given the cardiac pacemaker. No acute skull fracture. Chronic microvascular ischemic changes in both cerebral hemispheres and left lacunar infarction. Electronically Signed   By: Revonda Humphrey   On: 12/02/2019 18:46   CT CHEST W CONTRAST  Result Date: 12/03/2019 CLINICAL DATA:  Lymphoma. EXAM: CT CHEST, ABDOMEN, AND PELVIS WITH CONTRAST TECHNIQUE: Multidetector CT imaging of the chest, abdomen and pelvis was performed following the standard protocol during bolus administration of intravenous contrast. Automatic exposure control utilized. CONTRAST:  145mL OMNIPAQUE IOHEXOL 300 MG/ML  SOLN COMPARISON:  August 10 2018. CT imaging of the cervical, thoracic and lumbar spine earlier on the same date. FINDINGS: CT CHEST FINDINGS Cardiovascular: Mild cardiomegaly with coarse aortic valvular calcification and atrioventricular cardiac pacemaker with left-sided battery pack. Pulmonary trunk measuring 3.3 cm. Coarse long segment LAD and left circumflex coronary calcifications. Mild left main coronary and moderate right main coronary calcification. Mediastinum/Nodes: Thyroidectomy surgical clips. Redemonstration a mildly enlarged mediastinal lymph node measuring 12 mm short axis diameter, noncalcified. Small hiatal hernia through a gastric lap band. No adenopathy of the bilateral axilla or lower neck or chest wall. Lungs/Pleura: Similar sizes of a small right and trace left pleural effusion compared to earlier on the same date. Fibrotic  bronchiectasis in the medial right upper and middle lobes, likely sequela of remote radiation therapy. Mild centrilobular emphysema. Patchy ground-glass opacities in both lungs. A couple bilateral small pulmonary nodules measuring up to 6 mm similar to  November 2019. A right basilar 14 mm and 13 mm nodule which were present on the 2019 study are not re-evaluated, at least partially obscured by the pleural effusion, which could be malignant. Musculoskeletal: Redemonstration of diffusely abnormal permeative bone pattern throughout the skeleton and several small lytic and sclerotic osseous lesions reported on spinal imaging performed earlier today. Right shoulder arthroplasty. CT ABDOMEN PELVIS FINDINGS Hepatobiliary: Borderline hepatomegaly. Chronic mild intrahepatic biliary duct dilatation measuring up to 11 mm and chronic 17 mm proximal common bile duct with normal distal tapering and no calcified choledocholithiasis. Stable size of a 13 mm simple hepatic cyst. Normal smooth hepatic contours with a couple benign granulomatous calcifications. Surgically absent or decompressed gallbladder. Pancreas: Questionable pancreatic divisum, coronal series 6, image 62. Diffuse mild atrophy. No acute pancreatitis. No apparent dilatation of the main pancreatic duct. Spleen: Normally sized spleen with benign granulomatous calcifications. 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 Adrenals/Urinary Tract: Mild thickening of the bilateral adrenal glands without overt nodularity. Normal sizes of both kidneys. Nonobstructing bilateral renal calculi. Symmetrical medical renal disease. Abnormal enhancement of the bilateral ureteral uroepithelium and improved but persistent bilateral hydronephrosis. No right or left ureteral calculus. Stomach/Bowel: Gastric lap band placement, the band appropriately positioned at a 45 degree angle to the horizontal. No evidence of gastric erosion. Mostly decompressed gastric and  small bowel with circumferential moderate mucosal thickening of the duodenum and proximal jejunum concerning for lymphomatous infiltration given the lack of adjacent mesenteric edema. Distal colonic diverticulosis. Large amount of stool in the colon and rectum. Surgically absent or decompressed appendix. Vascular/Lymphatic: Thoracoabdominal aorto bi-iliac calcified atherosclerosis without aneurysmal dilatation. Small scattered retroperitoneal lymph nodes. A mildly enlarged periportal node measuring 20 x 14 mm. An abnormally increased number of clustered tiny mesenteric lymph nodes most confluent at the mesenteric root and lesser sac where there is subtle fat stranding, consistent with the patient history of lymphoma. Reproductive: Hysterectomy.  No adnexal cyst or mass. Other: A subcutaneous fat plane ovoid soft tissue with central calcification abutting the dermal surface without adjacent inflammatory change, probably a benign sebaceous cyst in the midline lower back soft tissues measuring 13 mm. Musculoskeletal: Redemonstration of diffusely abnormal permeative bone pattern throughout the skeleton and several small lytic and sclerotic osseous lesions reported on spinal imaging performed earlier today. IMPRESSION: An abnormal number of clustered small lymph nodes in the mesenteric root and lesser sac and mesenteric misting in the lesser sac. Mildly enlarged mediastinal and periportal lymph node. Abnormal diffusely thickened duodenal and proximal jejunal bowel wall. Diffuse permeative bone pattern with several small suspicious osseous lesions. These findings are consistent with the patient history of lymphoma. A small right pleural effusion and trace left pleural effusion, possibly bland or malignant. The former obscuring reevaluation of the prior suspicious 14 mm and 13 mm right basilar nodules. Stable left basilar 7 mm subsolid nodule. An additional number of smaller bilateral pulmonary nodules, some probably  sequela of benign granulomatous disease. A concommitant lymphomatous plaque etiology, primary lung cancer, or metastatic nodule are also differential considerations. Benign granulomatous disease of the spleen and liver. Gastric lap band appropriate placement with small hiatal herniation, but no evidence of gastric erosion. Improved but persistent bilateral mild hydronephrosis with abnormal enhancement of the bilateral ureteral uroepithelium concerning for an acute upper urinary tract infection. Emphysema and aortic calcified atherosclerosis. 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 Mild cardiomegaly with four-vessel coronary calcification, severe along the LAD and left circumflex arteries. Pulmonary artery hypertension. Chronic prominent  biliary dilatation without apparent dilatation of the main pancreatic duct. Possible pancreatic divisum. Small subdermal lesion of the midline lower back, possibly a benign sebaceous cyst. Electronically Signed   By: Revonda Humphrey   On: 12/03/2019 00:33   CT Cervical Spine Wo Contrast  Result Date: 12/02/2019 CLINICAL DATA:  Level 2 trauma. Fall with neck pain. EXAM: CT CERVICAL SPINE WITHOUT CONTRAST TECHNIQUE: Multidetector CT imaging of the cervical spine was performed without intravenous contrast. Multiplanar CT image reconstructions were also generated. COMPARISON:  July 29, 2018 FINDINGS: Alignment: Degenerative appearing grade 1 retrolisthesis of C5 on C6 and C6 on C7. Reversal of the cervical lordosis. Skull base and vertebrae: No acute fracture. Degenerative anterior wedging and endplate moderate hypertrophy of the mid cervical levels. Soft tissues and spinal canal: No prevertebral fluid or swelling. No visible canal hematoma. Disc levels: No posttraumatic disruption of the disc spaces. Diffuse cervical degenerative disc space narrowing, advanced from C5 through C7. Upper chest: Centrilobular emphysema with bilateral upper lobe bronchitis, small  right 4 Hounsfield unit dependent pleural effusion, and patchy ground-glass opacity and airspace disease in the medial upper lobes, possibly infectious. Aortic arch calcified atherosclerosis. Thyroidectomy surgical clips. Bilateral carotid calcified atherosclerosis. Partial imaging of a cardiac pacemaker. A benign granulomatous calcification in the left lung apex. Other: Diffuse bone demineralization. A 6 mm T1 and a 5 mm C3 ovoid lytic lesion, concerning for possible metastatic disease to the skeleton. IMPRESSION: 1. No acute fracture or malalignment of the cervical spine. 2. Two suspicious small lytic lesions in the T1 and C3 vertebra, possibly osteolytic metastasis to the skeleton. 3. Centrilobular emphysema with bilateral upper lobe bronchitis, small right pleural effusion, and patchy pulmonary disease, possibly infectious. 4. Aortic atherosclerosis (ICD10-I70.0) and Emphysema (ICD10-J43.9). 5. Advanced cervical degenerative disc disease. 6. Diffuse bone demineralization. Electronically Signed   By: Revonda Humphrey   On: 12/02/2019 19:04   CT Thoracic Spine Wo Contrast  Result Date: 12/02/2019 CLINICAL DATA:  Level 2 trauma.  Fall with back pain. EXAM: CT THORACIC SPINE WITHOUT CONTRAST TECHNIQUE: Multidetector CT images of the thoracic were obtained using the standard protocol without intravenous contrast. Automatic exposure control utilized. COMPARISON:  CT chest dated August 13, 2019 FINDINGS: Alignment: Normal. Vertebrae: Interval T12 superior endplate intravertebral disc herniation, possibly acute. Chronic mild compression deformities of T12, and the lower cervical vertebra without an acute compression fracture or fracture lucency. Redemonstration of abnormal diffuse permeative bone marrow pattern. Suspicious small lytic and sclerotic lesion measuring 6 mm in the T1 vertebra. Paraspinal and other soft tissues: Mildly enlarged precarinal lymph node measuring 12 mm short axis diameter, noncalcified.  Cardiomegaly with pulmonary artery hypertension, cardiac pacemaker, coarse mitral annulus calcification and coarse coronary calcifications. Small right 7 Hounsfield unit pleural effusion. Trace left pleural fluid. Central pulmonary vascular congestion and patchy airspace and small airways disease in both lungs, bilateral bronchitis, centrilobular emphysema, and right middle lobe fibrosis, likely sequela from prior radiation therapy. Disc levels: No posttraumatic widening or disruption. Intervertebral vacuum disc phenomenon at T11-T12. IMPRESSION: Possibly acute superior endplate intravertebral disc herniation at T12. Other chronic degenerative osseous findings. Diffuse abnormal permeative bone marrow pattern, possibly aggressive osteopenia or a benign or malignant marrow infiltrating disease. Small suspicious 6 mm lytic and sclerotic lesion in the T1 vertebra, possibly metastatic. Nonspecific mildly enlarged mediastinal lymph node that could be benign or metastatic. Cardiomegaly and pulmonary artery hypertension with coarse coronary calcifications, trace left pleural effusion, small right pleural effusion, mild pulmonary edema, bilateral bronchitis and possible  superimposed infectious pneumonitis. Sequela of prior radiation therapy in the right middle lobe. Thoracoabdominal aortic calcified atherosclerosis and emphysema. Electronically Signed   By: Revonda Humphrey   On: 12/02/2019 19:35   CT Lumbar Spine Wo Contrast  Result Date: 12/02/2019 CLINICAL DATA:  Level 2 trauma.  Fall with back pain. EXAM: CT LUMBAR SPINE WITHOUT CONTRAST TECHNIQUE: Multidetector CT imaging of the lumbar spine was performed without intravenous contrast administration. Multiplanar CT image reconstructions were also generated. Automatic exposure control utilized. COMPARISON:  January 02, 2019. FINDINGS: Segmentation: Five non-rib-bearing lumbar vertebral bodies. Alignment: Grade 1 retrolisthesis of L3 on L4, unchanged and likely  degenerative. Straightening of the lumbar lordosis. Vertebrae: Interval T12 small intravertebral disc herniation. Similar chronic mild 10% compression deformities of the T12 and L4 superior endplates. Diffuse permeative bone pattern. A 5 mm L2 and T11 suspicious lytic lesion. An L2 5 mm sclerotic lesion, unchanged. Paraspinal and other soft tissues: Centrilobular emphysema. Remote gastric banding with hiatal hernia. Mild bilateral hydronephrosis with mild perinephric and peri ureteral inflammatory change, possibly infectious or secondary to medical renal disease. Nonobstructing left and right renal calcifications. Benign splenic granulomatous calcification. No retroperitoneal hematoma. Aortic calcified atherosclerosis without an abdominal aortic aneurysm. Disc levels: No posttraumatic disruption. Diffuse mild and moderate acquired disc disease and hypertrophic bone spurring. IMPRESSION: Interval T12 superior endplate intravertebral disc herniation, which could be acute. No acute bony injury of the lumbar spine. Diffusely abnormal permeative bone marrow pattern concerning for aggressive osteopenia or a benign or malignant marrow infiltrating disease. A couple small lytic and sclerotic osseous lesions, stable compared to April 2020. Lumbar muscle spasm and chronic degenerative changes and acquired disc disease. Aortic calcified atherosclerosis and centrilobular emphysema. Mild bilateral hydronephrosis with perinephric and peri-ureteral inflammatory changes and nonobstructing bilateral renal calculi. Recommend correlation with urinalysis to exclude an upper urinary tract infection. Electronically Signed   By: Revonda Humphrey   On: 12/02/2019 19:20   CT ABDOMEN PELVIS W CONTRAST  Result Date: 12/03/2019 CLINICAL DATA:  Lymphoma. EXAM: CT CHEST, ABDOMEN, AND PELVIS WITH CONTRAST TECHNIQUE: Multidetector CT imaging of the chest, abdomen and pelvis was performed following the standard protocol during bolus administration  of intravenous contrast. Automatic exposure control utilized. CONTRAST:  119mL OMNIPAQUE IOHEXOL 300 MG/ML  SOLN COMPARISON:  August 10 2018. CT imaging of the cervical, thoracic and lumbar spine earlier on the same date. FINDINGS: CT CHEST FINDINGS Cardiovascular: Mild cardiomegaly with coarse aortic valvular calcification and atrioventricular cardiac pacemaker with left-sided battery pack. Pulmonary trunk measuring 3.3 cm. Coarse long segment LAD and left circumflex coronary calcifications. Mild left main coronary and moderate right main coronary calcification. Mediastinum/Nodes: Thyroidectomy surgical clips. Redemonstration a mildly enlarged mediastinal lymph node measuring 12 mm short axis diameter, noncalcified. Small hiatal hernia through a gastric lap band. No adenopathy of the bilateral axilla or lower neck or chest wall. Lungs/Pleura: Similar sizes of a small right and trace left pleural effusion compared to earlier on the same date. Fibrotic bronchiectasis in the medial right upper and middle lobes, likely sequela of remote radiation therapy. Mild centrilobular emphysema. Patchy ground-glass opacities in both lungs. A couple bilateral small pulmonary nodules measuring up to 6 mm similar to November 2019. A right basilar 14 mm and 13 mm nodule which were present on the 2019 study are not re-evaluated, at least partially obscured by the pleural effusion, which could be malignant. Musculoskeletal: Redemonstration of diffusely abnormal permeative bone pattern throughout the skeleton and several small lytic and sclerotic osseous lesions reported  on spinal imaging performed earlier today. Right shoulder arthroplasty. CT ABDOMEN PELVIS FINDINGS Hepatobiliary: Borderline hepatomegaly. Chronic mild intrahepatic biliary duct dilatation measuring up to 11 mm and chronic 17 mm proximal common bile duct with normal distal tapering and no calcified choledocholithiasis. Stable size of a 13 mm simple hepatic cyst.  Normal smooth hepatic contours with a couple benign granulomatous calcifications. Surgically absent or decompressed gallbladder. Pancreas: Questionable pancreatic divisum, coronal series 6, image 62. Diffuse mild atrophy. No acute pancreatitis. No apparent dilatation of the main pancreatic duct. Spleen: Normally sized spleen with benign granulomatous calcifications. 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 Adrenals/Urinary Tract: Mild thickening of the bilateral adrenal glands without overt nodularity. Normal sizes of both kidneys. Nonobstructing bilateral renal calculi. Symmetrical medical renal disease. Abnormal enhancement of the bilateral ureteral uroepithelium and improved but persistent bilateral hydronephrosis. No right or left ureteral calculus. Stomach/Bowel: Gastric lap band placement, the band appropriately positioned at a 45 degree angle to the horizontal. No evidence of gastric erosion. Mostly decompressed gastric and small bowel with circumferential moderate mucosal thickening of the duodenum and proximal jejunum concerning for lymphomatous infiltration given the lack of adjacent mesenteric edema. Distal colonic diverticulosis. Large amount of stool in the colon and rectum. Surgically absent or decompressed appendix. Vascular/Lymphatic: Thoracoabdominal aorto bi-iliac calcified atherosclerosis without aneurysmal dilatation. Small scattered retroperitoneal lymph nodes. A mildly enlarged periportal node measuring 20 x 14 mm. An abnormally increased number of clustered tiny mesenteric lymph nodes most confluent at the mesenteric root and lesser sac where there is subtle fat stranding, consistent with the patient history of lymphoma. Reproductive: Hysterectomy.  No adnexal cyst or mass. Other: A subcutaneous fat plane ovoid soft tissue with central calcification abutting the dermal surface without adjacent inflammatory change, probably a benign sebaceous cyst in the midline  lower back soft tissues measuring 13 mm. Musculoskeletal: Redemonstration of diffusely abnormal permeative bone pattern throughout the skeleton and several small lytic and sclerotic osseous lesions reported on spinal imaging performed earlier today. IMPRESSION: An abnormal number of clustered small lymph nodes in the mesenteric root and lesser sac and mesenteric misting in the lesser sac. Mildly enlarged mediastinal and periportal lymph node. Abnormal diffusely thickened duodenal and proximal jejunal bowel wall. Diffuse permeative bone pattern with several small suspicious osseous lesions. These findings are consistent with the patient history of lymphoma. A small right pleural effusion and trace left pleural effusion, possibly bland or malignant. The former obscuring reevaluation of the prior suspicious 14 mm and 13 mm right basilar nodules. Stable left basilar 7 mm subsolid nodule. An additional number of smaller bilateral pulmonary nodules, some probably sequela of benign granulomatous disease. A concommitant lymphomatous plaque etiology, primary lung cancer, or metastatic nodule are also differential considerations. Benign granulomatous disease of the spleen and liver. Gastric lap band appropriate placement with small hiatal herniation, but no evidence of gastric erosion. Improved but persistent bilateral mild hydronephrosis with abnormal enhancement of the bilateral ureteral uroepithelium concerning for an acute upper urinary tract infection. Emphysema and aortic calcified atherosclerosis. 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 Mild cardiomegaly with four-vessel coronary calcification, severe along the LAD and left circumflex arteries. Pulmonary artery hypertension. Chronic prominent biliary dilatation without apparent dilatation of the main pancreatic duct. Possible pancreatic divisum. Small subdermal lesion of the midline lower back, possibly a benign sebaceous cyst. Electronically  Signed   By: Revonda Humphrey   On: 12/03/2019 00:33   DG Chest Port 1 View  Result Date: 12/02/2019 CLINICAL DATA:  Trauma. Fall from standing.  On anticoagulation. EXAM: PORTABLE CHEST 1 VIEW COMPARISON:  Radiograph 04/02/2019 FINDINGS: Borderline cardiomegaly. Post left atrial clipping. Mediastinal contours are unchanged. Left-sided pacemaker with leads unchanged in position. No focal airspace disease. No pneumothorax. No large pleural effusion. No pulmonary edema. Reverse right shoulder arthroplasty. No acute osseous abnormalities are seen. Surgical clips at the thoracic inlet. IMPRESSION: No evidence of acute traumatic injury to the thorax. Electronically Signed   By: Keith Rake M.D.   On: 12/02/2019 18:10    [facility]:ERDEYC fibrillation, rate 106 (personally reviewed)  TELEMETRY: atrial fibrillation, V rates 90-120 (personally reviewed)  DEVICE HISTORY: MDT dual chamber PPM   Assessment/Plan: 1.  Persistent atrial fibrillation Recurrent after recent cardioversion Will increase Amiodarone to 400mg  twice daily Discussed with Dr Caryl Comes this morning, consider Ranexa (this carries QT prolongation warning - will review with him before ordering) Anticoagulation currently on hold for ICH - resume when ok with neurosurgery Will need to pursue repeat cardioversion after back on therapeutic anticoagulation  2.  Syncope Her episode yesterday was clearly not syncope. Her episode Wednesday morning is consistent with prior orthostatic symptoms This is likely worsened by AF Will check orthostatics This will likely improve with restoration of SR  3.  ?lesion seen on CT Pacemaker is MRI compatible Ok to program pacemaker ODO for MRI scan - order placed   Dr Caryl Comes to see later today      For questions or updates, please contact [facility] Please consult [email or web address] for contact info under Cardiology/STEMI.  Signed, Chanetta Marshall, NP 12/03/2019 9:36 AM  As above.  Persistent atrial  fibrillation/flutter poorly tolerated.  Previously it was largely atrial fibrillation.  Now it has been in atrial flutter on the last few occasions.   She has had extensive ablation including convergent ablation.  I do not know if she would be a candidate for another endovascular procedure for the flutter circuirt  However it is noteworthy that her atrial arrhythmias as they have recurred have been associated with rapid ventricular response also with flutter at 2: 1 conduction with a ventricular rate of about 120.  This may be amenable to AV junction ablation   would like to anticipate cardioversion; however, her apixaban was held for 24 hours because of the concerns of intracranial bleed.  Hence, she will need TEE cardioversion.  It was offered to her to stay in hospital and have it done where to go home and have it done as an outpatient; she has elected the latter.  In anticipation, we will increase her amiodarone to 400 twice daily for reloading, and use as adjunctive ranolazine 500 mg twice daily.  I am concerned about her orthostatic dizziness.  Objective measurements are unilluminating.  Her syncope has been mostly in the showers.  I have encouraged her to get a shower chair.

## 2019-12-03 NOTE — Care Management (Signed)
12-03-19 1704 Case Manger spoke with patient regarding Occupational Therapy recommendations. Patient was undecided about outpatient therapy at the time of visit- patient is currently enrolled in cardiac pulmonary rehab. Weekend Case Manager to follow up with the patient in case she needs an ambulatory referral to outpatient therapy. Bethena Roys, RN,BSN Case Manager

## 2019-12-03 NOTE — Consult Note (Addendum)
ELECTROPHYSIOLOGY CONSULT NOTE    Patient ID: Kayla Harrison MRN: [id number], DOB/AGE: 12-17-1945 52 y.o.  Admit date: 12/02/2019 Date of Consult: 12/03/2019  Primary Physician: [name], MD Electrophysiologist: Caryl Comes  *She has another medical record number with full chart*  Patient Profile: Kayla Harrison is a 74 y.o. female with a history of multiple cancers, morbid obesity, hypothyroidism, HFpEF, OSA, atrial fibrillation and flutter s/p convergent ablation at [facility], and chronic orthostasis who is being seen today for the evaluation of atrial fibrillation and syncope at the request of Dr Neena Rhymes.  HPI:  Kayla Harrison is a 74 y.o. female with the above past medical history.  She underwent cardioversion 10 days ago with significant symptomatic improvement.  On Tuesday evening, she went back into atrial fibrillation. She was taking a shower Wednesday morning and had her typical orthostasis prodrome and awoke sitting on the floor.  She called EMS and had to scoot across the floor to open the door.  She did not go to the hospital. She went grocery shopping yesterday and went to her sister's house afterwards. She walked outside with her walker and her legs gave out and she fell backwards. She is clear that she did not pass out yesterday.  She came to the ER by EMS.  Imaging demonstrated ICH as well as concerning spots on spine that may be metastasis.  She is pending MRI scan later today for further evaluation.   She denies chest pain, dyspnea, PND, orthopnea, nausea, vomiting,  edema, weight gain, or early satiety.  See her other chart for medical and surgical history   Medications Prior to Admission  Medication Sig Dispense Refill Last Dose  . acetaminophen (TYLENOL) 500 MG tablet Take 500-1,000 mg by mouth every 6 (six) hours as needed for mild pain or headache.   unk at [employer]  . amiodarone (PACERONE) 200 MG tablet Take 200 mg by mouth daily.    12/02/2019 at 0700  .  ELIQUIS 5 MG TABS tablet Take 5 mg by mouth 2 (two) times daily.    12/02/2019 at 0700  . furosemide (LASIX) 20 MG tablet Take 20 mg by mouth daily as needed for fluid or edema.    unk at [facility]  . levothyroxine (SYNTHROID) 137 MCG tablet Take 137 mcg by mouth See admin instructions. Take 137 mcg by mouth in the morning before breakfast on Sun/Mon/Tues/Wed/Thurs/Fri (nothing on Saturdays)   12/02/2019 at 0700  . Lifitegrast (XIIDRA) 5 % SOLN Place 1 drop into both eyes every evening.    12/01/2019 at pm  . Multiple Vitamins-Minerals (ONE-A-DAY WOMENS 50+ ADVANTAGE) TABS Take 1 tablet by mouth daily with breakfast.   12/02/2019 at am  . rosuvastatin (CRESTOR) 10 MG tablet Take 10 mg by mouth See admin instructions. Take 10 mg by mouth on Mondays and Thursdays   12/02/2019 at am    Inpatient Medications:  . amiodarone  200 mg Oral Daily  . levothyroxine  137 mcg Oral Once per day on Sun Mon Tue Wed Thu Fri  . multivitamin with minerals  1 tablet Oral Q breakfast  . [START ON 12/06/2019] rosuvastatin  10 mg Oral Once per day on Mon Thu    Allergies:  Allergies  Allergen Reactions  . Rivaroxaban     Nose Bleed X 6 hrs ; packing in ER Nasal hemorrhage  . Tape Other (See Comments)    SKIN IS THIN AND TEARS EASILY   . Tikosyn [Dofetilide] Other (See Comments)    "  Long QT wave"   . Epinephrine   . Ketamine Other (See Comments)    Hallucinations     Social History   Socioeconomic History  . Marital status: Single    Spouse name: Not on file  . Number of children: Not on file  . Years of education: Not on file  . Highest education level: Not on file  Occupational History  . Not on file  Tobacco Use  . Smoking status: Not on file  Substance and Sexual Activity  . Alcohol use: Not on file  . Drug use: Not on file  . Sexual activity: Not on file  Other Topics Concern  . Not on file  Social History Narrative  . Not on file   Social Determinants of Health   Financial Resource Strain:    . Difficulty of Paying Living Expenses:   Food Insecurity:   . Worried About Charity fundraiser in the Last Year:   . Arboriculturist in the Last Year:   Transportation Needs:   . Film/video editor (Medical):   Marland Kitchen Lack of Transportation (Non-Medical):   Physical Activity:   . Days of Exercise per Week:   . Minutes of Exercise per Session:   Stress:   . Feeling of Stress :   Social Connections:   . Frequency of Communication with Friends and Family:   . Frequency of Social Gatherings with Friends and Family:   . Attends Religious Services:   . Active Member of Clubs or Organizations:   . Attends Archivist Meetings:   Marland Kitchen Marital Status:   Intimate Partner Violence:   . Fear of Current or Ex-Partner:   . Emotionally Abused:   Marland Kitchen Physically Abused:   . Sexually Abused:      See other chart for family history   Review of Systems: All other systems reviewed and are otherwise negative except as noted above.  Physical Exam: Vitals:   12/03/19 0500 12/03/19 0535 12/03/19 0540 12/03/19 0733  BP: 127/80   120/75  Pulse: (!) 101 (!) 101  (!) 104  Resp: (!) 21 16  20   Temp: 98.3 F (36.8 C)   98.1 F (36.7 C)  TempSrc: Oral   Oral  SpO2: 94% 94%  95%  Weight:   102.7 kg   Height:        GEN- The patient is well appearing, alert and oriented x 3 today.   HEENT: normocephalic, atraumatic; sclera clear, conjunctiva pink; hearing intact; oropharynx clear; neck supple Lungs- Clear to ausculation bilaterally, normal work of breathing.  No wheezes, rales, rhonchi Heart- Regular rate and rhythm, no murmurs, rubs or gallops GI- soft, non-tender, non-distended, bowel sounds present Extremities- no clubbing, cyanosis, or edema; DP/PT/radial pulses 2+ bilaterally MS- no significant deformity or atrophy Skin- warm and dry, no rash or lesion Psych- euthymic mood, full affect Neuro- strength and sensation are intact  Labs:   Lab Results  Component Value Date   WBC 7.4  12/03/2019   HGB 12.8 12/03/2019   HCT 40.0 12/03/2019   MCV 92.4 12/03/2019   PLT 180 12/03/2019    Recent Labs  Lab 12/02/19 1732 12/02/19 1732 12/02/19 1751  NA 141   < > 140  K 4.3   < > 4.1  CL 110   < > 112*  CO2 20*  --   --   BUN 22   < > 22  CREATININE 1.07*   < > 1.00  CALCIUM 8.5*  --   --   PROT 5.8*  --   --   BILITOT 0.5  --   --   ALKPHOS 80  --   --   ALT 19  --   --   AST 18  --   --   GLUCOSE 108*   < > 106*   < > = values in this interval not displayed.      Radiology/Studies: CT Head Wo Contrast  Result Date: 12/02/2019 CLINICAL DATA:  Level 2 trauma. Fall hitting the back of the head. EXAM: CT HEAD WITHOUT CONTRAST TECHNIQUE: Contiguous axial images were obtained from the base of the skull through the vertex without intravenous contrast. Automatic exposure control utilized. COMPARISON:  January 02, 2019 FINDINGS: Brain: A hyperdense 7 mm focus in the right occipital lobe cortical gray matter on series 3, image 15 and series 5, image 61 which is new and concerning for an acute intraparenchymal hemorrhage. A chronic linear density in the left temporal convexity on series 3, image 13 is unchanged and probably vascular in etiology. There is no abnormal extra-axial fluid collection, midline shift, mass effect, vascular territory distribution infarction. Chronic left caudate lacunar infarction and bilateral cerebral microvascular white matter ischemic changes are similar. There is redemonstration of basal ganglia calcifications. Vascular: Atherosclerotic calcifications including along a right sylvian fissure and anterior parafalcine vessel. Skull: No acute skull fracture. No subgaleal hematoma. Sinuses/Orbits: Mild bilateral frontal and ethmoidal mucosal thickening. No posttraumatic air-fluid levels. Normal bilateral orbital soft tissues. Other: Metallic dental artifact degrades image quality, particularly in the posterior fossa. I discussed critical Value/emergent results  and the need for surveillance imaging by telephone at the time of interpretation on 12/02/2019 at 6:45 pm with provider [facility] , who verbally acknowledged these results. IMPRESSION: A small 7 mm hyperdense focus in the right occipital lobe is concerning for an acute intraparenchymal hemorrhage. An underlying hemorrhagic metastatic lesion is a differential consideration. Surveillance CT brain without and with intravenous contrast recommended in 2-3 months for further evaluation given the cardiac pacemaker. No acute skull fracture. Chronic microvascular ischemic changes in both cerebral hemispheres and left lacunar infarction. Electronically Signed   By: Revonda Humphrey   On: 12/02/2019 18:46   CT CHEST W CONTRAST  Result Date: 12/03/2019 CLINICAL DATA:  Lymphoma. EXAM: CT CHEST, ABDOMEN, AND PELVIS WITH CONTRAST TECHNIQUE: Multidetector CT imaging of the chest, abdomen and pelvis was performed following the standard protocol during bolus administration of intravenous contrast. Automatic exposure control utilized. CONTRAST:  159mL OMNIPAQUE IOHEXOL 300 MG/ML  SOLN COMPARISON:  August 10 2018. CT imaging of the cervical, thoracic and lumbar spine earlier on the same date. FINDINGS: CT CHEST FINDINGS Cardiovascular: Mild cardiomegaly with coarse aortic valvular calcification and atrioventricular cardiac pacemaker with left-sided battery pack. Pulmonary trunk measuring 3.3 cm. Coarse long segment LAD and left circumflex coronary calcifications. Mild left main coronary and moderate right main coronary calcification. Mediastinum/Nodes: Thyroidectomy surgical clips. Redemonstration a mildly enlarged mediastinal lymph node measuring 12 mm short axis diameter, noncalcified. Small hiatal hernia through a gastric lap band. No adenopathy of the bilateral axilla or lower neck or chest wall. Lungs/Pleura: Similar sizes of a small right and trace left pleural effusion compared to earlier on the same date. Fibrotic  bronchiectasis in the medial right upper and middle lobes, likely sequela of remote radiation therapy. Mild centrilobular emphysema. Patchy ground-glass opacities in both lungs. A couple bilateral small pulmonary nodules measuring up to 6 mm similar to  November 2019. A right basilar 14 mm and 13 mm nodule which were present on the 2019 study are not re-evaluated, at least partially obscured by the pleural effusion, which could be malignant. Musculoskeletal: Redemonstration of diffusely abnormal permeative bone pattern throughout the skeleton and several small lytic and sclerotic osseous lesions reported on spinal imaging performed earlier today. Right shoulder arthroplasty. CT ABDOMEN PELVIS FINDINGS Hepatobiliary: Borderline hepatomegaly. Chronic mild intrahepatic biliary duct dilatation measuring up to 11 mm and chronic 17 mm proximal common bile duct with normal distal tapering and no calcified choledocholithiasis. Stable size of a 13 mm simple hepatic cyst. Normal smooth hepatic contours with a couple benign granulomatous calcifications. Surgically absent or decompressed gallbladder. Pancreas: Questionable pancreatic divisum, coronal series 6, image 62. Diffuse mild atrophy. No acute pancreatitis. No apparent dilatation of the main pancreatic duct. Spleen: Normally sized spleen with benign granulomatous calcifications. 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 Adrenals/Urinary Tract: Mild thickening of the bilateral adrenal glands without overt nodularity. Normal sizes of both kidneys. Nonobstructing bilateral renal calculi. Symmetrical medical renal disease. Abnormal enhancement of the bilateral ureteral uroepithelium and improved but persistent bilateral hydronephrosis. No right or left ureteral calculus. Stomach/Bowel: Gastric lap band placement, the band appropriately positioned at a 45 degree angle to the horizontal. No evidence of gastric erosion. Mostly decompressed gastric and  small bowel with circumferential moderate mucosal thickening of the duodenum and proximal jejunum concerning for lymphomatous infiltration given the lack of adjacent mesenteric edema. Distal colonic diverticulosis. Large amount of stool in the colon and rectum. Surgically absent or decompressed appendix. Vascular/Lymphatic: Thoracoabdominal aorto bi-iliac calcified atherosclerosis without aneurysmal dilatation. Small scattered retroperitoneal lymph nodes. A mildly enlarged periportal node measuring 20 x 14 mm. An abnormally increased number of clustered tiny mesenteric lymph nodes most confluent at the mesenteric root and lesser sac where there is subtle fat stranding, consistent with the patient history of lymphoma. Reproductive: Hysterectomy.  No adnexal cyst or mass. Other: A subcutaneous fat plane ovoid soft tissue with central calcification abutting the dermal surface without adjacent inflammatory change, probably a benign sebaceous cyst in the midline lower back soft tissues measuring 13 mm. Musculoskeletal: Redemonstration of diffusely abnormal permeative bone pattern throughout the skeleton and several small lytic and sclerotic osseous lesions reported on spinal imaging performed earlier today. IMPRESSION: An abnormal number of clustered small lymph nodes in the mesenteric root and lesser sac and mesenteric misting in the lesser sac. Mildly enlarged mediastinal and periportal lymph node. Abnormal diffusely thickened duodenal and proximal jejunal bowel wall. Diffuse permeative bone pattern with several small suspicious osseous lesions. These findings are consistent with the patient history of lymphoma. A small right pleural effusion and trace left pleural effusion, possibly bland or malignant. The former obscuring reevaluation of the prior suspicious 14 mm and 13 mm right basilar nodules. Stable left basilar 7 mm subsolid nodule. An additional number of smaller bilateral pulmonary nodules, some probably  sequela of benign granulomatous disease. A concommitant lymphomatous plaque etiology, primary lung cancer, or metastatic nodule are also differential considerations. Benign granulomatous disease of the spleen and liver. Gastric lap band appropriate placement with small hiatal herniation, but no evidence of gastric erosion. Improved but persistent bilateral mild hydronephrosis with abnormal enhancement of the bilateral ureteral uroepithelium concerning for an acute upper urinary tract infection. Emphysema and aortic calcified atherosclerosis. 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 Mild cardiomegaly with four-vessel coronary calcification, severe along the LAD and left circumflex arteries. Pulmonary artery hypertension. Chronic prominent  biliary dilatation without apparent dilatation of the main pancreatic duct. Possible pancreatic divisum. Small subdermal lesion of the midline lower back, possibly a benign sebaceous cyst. Electronically Signed   By: Revonda Humphrey   On: 12/03/2019 00:33   CT Cervical Spine Wo Contrast  Result Date: 12/02/2019 CLINICAL DATA:  Level 2 trauma. Fall with neck pain. EXAM: CT CERVICAL SPINE WITHOUT CONTRAST TECHNIQUE: Multidetector CT imaging of the cervical spine was performed without intravenous contrast. Multiplanar CT image reconstructions were also generated. COMPARISON:  July 29, 2018 FINDINGS: Alignment: Degenerative appearing grade 1 retrolisthesis of C5 on C6 and C6 on C7. Reversal of the cervical lordosis. Skull base and vertebrae: No acute fracture. Degenerative anterior wedging and endplate moderate hypertrophy of the mid cervical levels. Soft tissues and spinal canal: No prevertebral fluid or swelling. No visible canal hematoma. Disc levels: No posttraumatic disruption of the disc spaces. Diffuse cervical degenerative disc space narrowing, advanced from C5 through C7. Upper chest: Centrilobular emphysema with bilateral upper lobe bronchitis, small  right 4 Hounsfield unit dependent pleural effusion, and patchy ground-glass opacity and airspace disease in the medial upper lobes, possibly infectious. Aortic arch calcified atherosclerosis. Thyroidectomy surgical clips. Bilateral carotid calcified atherosclerosis. Partial imaging of a cardiac pacemaker. A benign granulomatous calcification in the left lung apex. Other: Diffuse bone demineralization. A 6 mm T1 and a 5 mm C3 ovoid lytic lesion, concerning for possible metastatic disease to the skeleton. IMPRESSION: 1. No acute fracture or malalignment of the cervical spine. 2. Two suspicious small lytic lesions in the T1 and C3 vertebra, possibly osteolytic metastasis to the skeleton. 3. Centrilobular emphysema with bilateral upper lobe bronchitis, small right pleural effusion, and patchy pulmonary disease, possibly infectious. 4. Aortic atherosclerosis (ICD10-I70.0) and Emphysema (ICD10-J43.9). 5. Advanced cervical degenerative disc disease. 6. Diffuse bone demineralization. Electronically Signed   By: Revonda Humphrey   On: 12/02/2019 19:04   CT Thoracic Spine Wo Contrast  Result Date: 12/02/2019 CLINICAL DATA:  Level 2 trauma.  Fall with back pain. EXAM: CT THORACIC SPINE WITHOUT CONTRAST TECHNIQUE: Multidetector CT images of the thoracic were obtained using the standard protocol without intravenous contrast. Automatic exposure control utilized. COMPARISON:  CT chest dated August 13, 2019 FINDINGS: Alignment: Normal. Vertebrae: Interval T12 superior endplate intravertebral disc herniation, possibly acute. Chronic mild compression deformities of T12, and the lower cervical vertebra without an acute compression fracture or fracture lucency. Redemonstration of abnormal diffuse permeative bone marrow pattern. Suspicious small lytic and sclerotic lesion measuring 6 mm in the T1 vertebra. Paraspinal and other soft tissues: Mildly enlarged precarinal lymph node measuring 12 mm short axis diameter, noncalcified.  Cardiomegaly with pulmonary artery hypertension, cardiac pacemaker, coarse mitral annulus calcification and coarse coronary calcifications. Small right 7 Hounsfield unit pleural effusion. Trace left pleural fluid. Central pulmonary vascular congestion and patchy airspace and small airways disease in both lungs, bilateral bronchitis, centrilobular emphysema, and right middle lobe fibrosis, likely sequela from prior radiation therapy. Disc levels: No posttraumatic widening or disruption. Intervertebral vacuum disc phenomenon at T11-T12. IMPRESSION: Possibly acute superior endplate intravertebral disc herniation at T12. Other chronic degenerative osseous findings. Diffuse abnormal permeative bone marrow pattern, possibly aggressive osteopenia or a benign or malignant marrow infiltrating disease. Small suspicious 6 mm lytic and sclerotic lesion in the T1 vertebra, possibly metastatic. Nonspecific mildly enlarged mediastinal lymph node that could be benign or metastatic. Cardiomegaly and pulmonary artery hypertension with coarse coronary calcifications, trace left pleural effusion, small right pleural effusion, mild pulmonary edema, bilateral bronchitis and possible  superimposed infectious pneumonitis. Sequela of prior radiation therapy in the right middle lobe. Thoracoabdominal aortic calcified atherosclerosis and emphysema. Electronically Signed   By: Revonda Humphrey   On: 12/02/2019 19:35   CT Lumbar Spine Wo Contrast  Result Date: 12/02/2019 CLINICAL DATA:  Level 2 trauma.  Fall with back pain. EXAM: CT LUMBAR SPINE WITHOUT CONTRAST TECHNIQUE: Multidetector CT imaging of the lumbar spine was performed without intravenous contrast administration. Multiplanar CT image reconstructions were also generated. Automatic exposure control utilized. COMPARISON:  January 02, 2019. FINDINGS: Segmentation: Five non-rib-bearing lumbar vertebral bodies. Alignment: Grade 1 retrolisthesis of L3 on L4, unchanged and likely  degenerative. Straightening of the lumbar lordosis. Vertebrae: Interval T12 small intravertebral disc herniation. Similar chronic mild 10% compression deformities of the T12 and L4 superior endplates. Diffuse permeative bone pattern. A 5 mm L2 and T11 suspicious lytic lesion. An L2 5 mm sclerotic lesion, unchanged. Paraspinal and other soft tissues: Centrilobular emphysema. Remote gastric banding with hiatal hernia. Mild bilateral hydronephrosis with mild perinephric and peri ureteral inflammatory change, possibly infectious or secondary to medical renal disease. Nonobstructing left and right renal calcifications. Benign splenic granulomatous calcification. No retroperitoneal hematoma. Aortic calcified atherosclerosis without an abdominal aortic aneurysm. Disc levels: No posttraumatic disruption. Diffuse mild and moderate acquired disc disease and hypertrophic bone spurring. IMPRESSION: Interval T12 superior endplate intravertebral disc herniation, which could be acute. No acute bony injury of the lumbar spine. Diffusely abnormal permeative bone marrow pattern concerning for aggressive osteopenia or a benign or malignant marrow infiltrating disease. A couple small lytic and sclerotic osseous lesions, stable compared to April 2020. Lumbar muscle spasm and chronic degenerative changes and acquired disc disease. Aortic calcified atherosclerosis and centrilobular emphysema. Mild bilateral hydronephrosis with perinephric and peri-ureteral inflammatory changes and nonobstructing bilateral renal calculi. Recommend correlation with urinalysis to exclude an upper urinary tract infection. Electronically Signed   By: Revonda Humphrey   On: 12/02/2019 19:20   CT ABDOMEN PELVIS W CONTRAST  Result Date: 12/03/2019 CLINICAL DATA:  Lymphoma. EXAM: CT CHEST, ABDOMEN, AND PELVIS WITH CONTRAST TECHNIQUE: Multidetector CT imaging of the chest, abdomen and pelvis was performed following the standard protocol during bolus administration  of intravenous contrast. Automatic exposure control utilized. CONTRAST:  165mL OMNIPAQUE IOHEXOL 300 MG/ML  SOLN COMPARISON:  August 10 2018. CT imaging of the cervical, thoracic and lumbar spine earlier on the same date. FINDINGS: CT CHEST FINDINGS Cardiovascular: Mild cardiomegaly with coarse aortic valvular calcification and atrioventricular cardiac pacemaker with left-sided battery pack. Pulmonary trunk measuring 3.3 cm. Coarse long segment LAD and left circumflex coronary calcifications. Mild left main coronary and moderate right main coronary calcification. Mediastinum/Nodes: Thyroidectomy surgical clips. Redemonstration a mildly enlarged mediastinal lymph node measuring 12 mm short axis diameter, noncalcified. Small hiatal hernia through a gastric lap band. No adenopathy of the bilateral axilla or lower neck or chest wall. Lungs/Pleura: Similar sizes of a small right and trace left pleural effusion compared to earlier on the same date. Fibrotic bronchiectasis in the medial right upper and middle lobes, likely sequela of remote radiation therapy. Mild centrilobular emphysema. Patchy ground-glass opacities in both lungs. A couple bilateral small pulmonary nodules measuring up to 6 mm similar to November 2019. A right basilar 14 mm and 13 mm nodule which were present on the 2019 study are not re-evaluated, at least partially obscured by the pleural effusion, which could be malignant. Musculoskeletal: Redemonstration of diffusely abnormal permeative bone pattern throughout the skeleton and several small lytic and sclerotic osseous lesions reported  on spinal imaging performed earlier today. Right shoulder arthroplasty. CT ABDOMEN PELVIS FINDINGS Hepatobiliary: Borderline hepatomegaly. Chronic mild intrahepatic biliary duct dilatation measuring up to 11 mm and chronic 17 mm proximal common bile duct with normal distal tapering and no calcified choledocholithiasis. Stable size of a 13 mm simple hepatic cyst.  Normal smooth hepatic contours with a couple benign granulomatous calcifications. Surgically absent or decompressed gallbladder. Pancreas: Questionable pancreatic divisum, coronal series 6, image 62. Diffuse mild atrophy. No acute pancreatitis. No apparent dilatation of the main pancreatic duct. Spleen: Normally sized spleen with benign granulomatous calcifications. 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 Adrenals/Urinary Tract: Mild thickening of the bilateral adrenal glands without overt nodularity. Normal sizes of both kidneys. Nonobstructing bilateral renal calculi. Symmetrical medical renal disease. Abnormal enhancement of the bilateral ureteral uroepithelium and improved but persistent bilateral hydronephrosis. No right or left ureteral calculus. Stomach/Bowel: Gastric lap band placement, the band appropriately positioned at a 45 degree angle to the horizontal. No evidence of gastric erosion. Mostly decompressed gastric and small bowel with circumferential moderate mucosal thickening of the duodenum and proximal jejunum concerning for lymphomatous infiltration given the lack of adjacent mesenteric edema. Distal colonic diverticulosis. Large amount of stool in the colon and rectum. Surgically absent or decompressed appendix. Vascular/Lymphatic: Thoracoabdominal aorto bi-iliac calcified atherosclerosis without aneurysmal dilatation. Small scattered retroperitoneal lymph nodes. A mildly enlarged periportal node measuring 20 x 14 mm. An abnormally increased number of clustered tiny mesenteric lymph nodes most confluent at the mesenteric root and lesser sac where there is subtle fat stranding, consistent with the patient history of lymphoma. Reproductive: Hysterectomy.  No adnexal cyst or mass. Other: A subcutaneous fat plane ovoid soft tissue with central calcification abutting the dermal surface without adjacent inflammatory change, probably a benign sebaceous cyst in the midline  lower back soft tissues measuring 13 mm. Musculoskeletal: Redemonstration of diffusely abnormal permeative bone pattern throughout the skeleton and several small lytic and sclerotic osseous lesions reported on spinal imaging performed earlier today. IMPRESSION: An abnormal number of clustered small lymph nodes in the mesenteric root and lesser sac and mesenteric misting in the lesser sac. Mildly enlarged mediastinal and periportal lymph node. Abnormal diffusely thickened duodenal and proximal jejunal bowel wall. Diffuse permeative bone pattern with several small suspicious osseous lesions. These findings are consistent with the patient history of lymphoma. A small right pleural effusion and trace left pleural effusion, possibly bland or malignant. The former obscuring reevaluation of the prior suspicious 14 mm and 13 mm right basilar nodules. Stable left basilar 7 mm subsolid nodule. An additional number of smaller bilateral pulmonary nodules, some probably sequela of benign granulomatous disease. A concommitant lymphomatous plaque etiology, primary lung cancer, or metastatic nodule are also differential considerations. Benign granulomatous disease of the spleen and liver. Gastric lap band appropriate placement with small hiatal herniation, but no evidence of gastric erosion. Improved but persistent bilateral mild hydronephrosis with abnormal enhancement of the bilateral ureteral uroepithelium concerning for an acute upper urinary tract infection. Emphysema and aortic calcified atherosclerosis. 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 Mild cardiomegaly with four-vessel coronary calcification, severe along the LAD and left circumflex arteries. Pulmonary artery hypertension. Chronic prominent biliary dilatation without apparent dilatation of the main pancreatic duct. Possible pancreatic divisum. Small subdermal lesion of the midline lower back, possibly a benign sebaceous cyst. Electronically  Signed   By: Revonda Humphrey   On: 12/03/2019 00:33   DG Chest Port 1 View  Result Date: 12/02/2019 CLINICAL DATA:  Trauma. Fall from standing.  On anticoagulation. EXAM: PORTABLE CHEST 1 VIEW COMPARISON:  Radiograph 04/02/2019 FINDINGS: Borderline cardiomegaly. Post left atrial clipping. Mediastinal contours are unchanged. Left-sided pacemaker with leads unchanged in position. No focal airspace disease. No pneumothorax. No large pleural effusion. No pulmonary edema. Reverse right shoulder arthroplasty. No acute osseous abnormalities are seen. Surgical clips at the thoracic inlet. IMPRESSION: No evidence of acute traumatic injury to the thorax. Electronically Signed   By: Keith Rake M.D.   On: 12/02/2019 18:10    [id number] fibrillation, rate 106 (personally reviewed)  TELEMETRY: atrial fibrillation, V rates 90-120 (personally reviewed)  DEVICE HISTORY: MDT dual chamber PPM   Assessment/Plan: 1.  Persistent atrial fibrillation Recurrent after recent cardioversion Will increase Amiodarone to 400mg  twice daily Discussed with Dr Caryl Comes this morning, consider Ranexa (this carries QT prolongation warning - will review with him before ordering) Anticoagulation currently on hold for ICH - resume when ok with neurosurgery Will need to pursue repeat cardioversion after back on therapeutic anticoagulation  2.  Syncope Her episode yesterday was clearly not syncope. Her episode Wednesday morning is consistent with prior orthostatic symptoms This is likely worsened by AF Will check orthostatics This will likely improve with restoration of SR  3.  ?lesion seen on CT Pacemaker is MRI compatible Ok to program pacemaker ODO for MRI scan - order placed   Dr Caryl Comes to see later today      For questions or updates, please contact [facility] Please consult [email or web address] for contact info under Cardiology/STEMI.  Signed, Chanetta Marshall, NP 12/03/2019 9:36 AM  As above.  Persistent atrial  fibrillation/flutter poorly tolerated.  Previously it was largely atrial fibrillation.  Now it has been in atrial flutter on the last few occasions.   She has had extensive ablation including convergent ablation.  I do not know if she would be a candidate for another endovascular procedure for the flutter circuirt  However it is noteworthy that her atrial arrhythmias as they have recurred have been associated with rapid ventricular response also with flutter at 2: 1 conduction with a ventricular rate of about 120.  This may be amenable to AV junction ablation   would like to anticipate cardioversion; however, her apixaban was held for 24 hours because of the concerns of intracranial bleed.  Hence, she will need TEE cardioversion.  It was offered to her to stay in hospital and have it done where to go home and have it done as an outpatient; she has elected the latter.  In anticipation, we will increase her amiodarone to 400 twice daily for reloading, and use as adjunctive ranolazine 500 mg twice daily.  I am concerned about her orthostatic dizziness.  Objective measurements are unilluminating.  Her syncope has been mostly in the showers.  I have encouraged her to get a shower chair.

## 2019-12-03 NOTE — Evaluation (Signed)
Occupational Therapy Evaluation and Discharge Patient Details Name: Kayla Harrison MRN: [id number] DOB: 20-May-1946 Today's Date: 12/03/2019    History of Present Illness Kayla Harrison is a 74 y.o. female with medical history significant of recurrent A. fib/flutter status post PPM on Eliquis, history of non-Hodgkin's lymphoma status post chemotherapy in 2018, history of SVC syndrome, history of thyroid cancer status post thyroidectomy, hypertension, hyperlipidemia, hepatitis presenting to the ED after a fall (x2 in last two days with last on hitting her head). CT:A small 7 mm hyperdense focus in the right occipital lobe isconcerning for an acute intraparenchymal hemorrhage. An underlyinghemorrhagic metastatic lesion is a differential consideration, MRI:No acute abnormality. No acute infarct or mass. No evidence of acute hemorrhage in the right occipital lobe as questioned on CT. MRI cervical spine:Negative for cervical spine fracture or mass. T1 vertebral bodylesion on CT does not appear to represent tumor on MRI   Clinical Impression   This 74 yo female admitted s/p 2 falls on back to back days and hitting her head on 2nd fall. Pta pt was independent to Mod I for all basic ADLs, IADLs, and driving. Currently she is able to still do these tasks but at a much slower pace due to the recent falls. She also c/o of decreased handwriting since being treated for lympohma. Feel she will benefit from follow up OP OT to address the handwriting. We will sign off.    Follow Up Recommendations  Outpatient OT;Supervision - Intermittent    Equipment Recommendations  None recommended by OT       Precautions / Restrictions Precautions Precautions: Fall Precaution Comments: Due to now history of 2 falls in last 2 days Restrictions Weight Bearing Restrictions: No      Mobility Bed Mobility Overal bed mobility: Independent                Transfers Overall transfer level:  Independent Equipment used: Rolling walker (2 wheeled);None             General transfer comment: Used RW to bathroom and no AD out of bathroom    Balance Overall balance assessment: Mild deficits observed, not formally tested("I am moving slower than I normally would")                                         ADL either performed or assessed with clinical judgement   ADL Overall ADL's : Modified independent                                       General ADL Comments: increased time. Educated pt Aging Gracefully program that may be of benefit to her and gave her the handout for her to call if she decides she wants to check it out. Pt reports she will get a shower seat on her own.     Vision Patient Visual Report: No change from baseline              Pertinent Vitals/Pain Pain Assessment: Faces Faces Pain Scale: Hurts little more Pain Location: bottom when she sits down (had to scoot to get to phone when she fell) Pain Descriptors / Indicators: Sore Pain Intervention(s): Limited activity within patient's tolerance;Monitored during session     Hand Dominance Right   Extremity/Trunk Assessment Upper Extremity Assessment  Upper Extremity Assessment: Overall WFL for tasks assessed(pt does report that her hand writing has gotten worse since her treatments for lymphoma)           Communication Communication Communication: No difficulties   Cognition Arousal/Alertness: Awake/alert Behavior During Therapy: WFL for tasks assessed/performed Overall Cognitive Status: Within Functional Limits for tasks assessed                                                Home Living Family/patient expects to be discharged to:: Private residence Living Arrangements: Alone Available Help at Discharge: Friend(s);Neighbor Type of Home: House       Home Layout: One level     Bathroom Shower/Tub: Occupational psychologist:  [place]: Environmental consultant - 4 wheels;Cane - single point   Additional Comments: Uses rollator when out and about, cane when out in yard and to get from car to grocery cart      Prior Functioning/Environment Level of Independence: Independent        Comments: Cooks, cleans, drives        OT Problem List: Decreased coordination;Impaired balance (sitting and/or standing);Pain(for writing)         OT Goals(Current goals can be found in the care plan section) Acute Rehab OT Goals Patient Stated Goal: to go home today  OT Frequency:                AM-PAC OT "6 Clicks" Daily Activity     Outcome Measure Help from another person eating meals?: None Help from another person taking care of personal grooming?: None Help from another person toileting, which includes using toliet, bedpan, or urinal?: None Help from another person bathing (including washing, rinsing, drying)?: None Help from another person to put on and taking off regular upper body clothing?: None Help from another person to put on and taking off regular lower body clothing?: None 6 Click Score: 24   End of Session Equipment Utilized During Treatment: Gait belt;Rolling walker  Activity Tolerance: Patient tolerated treatment well Patient left: in bed;with call bell/phone within reach;with bed alarm set  OT Visit Diagnosis: Unsteadiness on feet (R26.81);Repeated falls (R29.6);History of falling (Z91.81);Pain Pain - part of body: (bottom from scooting after she fell at home)                Time: 6599-3570 OT Time Calculation (min): 33 min Charges:  OT General Charges $OT Visit: 1 Visit OT Evaluation $OT Eval Moderate Complexity: 1 Mod OT Treatments $Self Care/Home Management : 8-22 mins  Tye [place], OTR/L Acute Rehab Services Pager [phone] Office [phone]     12/03/2019, 3:02 PM

## 2019-12-04 DIAGNOSIS — I483 Typical atrial flutter: Secondary | ICD-10-CM

## 2019-12-04 DIAGNOSIS — S22080A Wedge compression fracture of T11-T12 vertebra, initial encounter for closed fracture: Secondary | ICD-10-CM

## 2019-12-04 DIAGNOSIS — R930 Abnormal findings on diagnostic imaging of skull and head, not elsewhere classified: Secondary | ICD-10-CM

## 2019-12-04 DIAGNOSIS — I5032 Chronic diastolic (congestive) heart failure: Secondary | ICD-10-CM

## 2019-12-04 DIAGNOSIS — R937 Abnormal findings on diagnostic imaging of other parts of musculoskeletal system: Secondary | ICD-10-CM

## 2019-12-04 LAB — MAGNESIUM: Magnesium: 2.3 mg/dL (ref 1.7–2.4)

## 2019-12-04 LAB — HEMOGLOBIN A1C
Hgb A1c MFr Bld: 5.5 % (ref 4.8–5.6)
Mean Plasma Glucose: 111.15 mg/dL

## 2019-12-04 LAB — LIPID PANEL
Cholesterol: 143 mg/dL (ref 0–200)
HDL: 41 mg/dL (ref >40)
LDL Cholesterol: 85 mg/dL (ref 0–99)
Total CHOL/HDL Ratio: 3.5 RATIO
Triglycerides: 87 mg/dL (ref <150)
VLDL: 17 mg/dL (ref 0–40)

## 2019-12-04 LAB — RENAL FUNCTION PANEL
Albumin: 2.9 g/dL — ABNORMAL LOW (ref 3.5–5.0)
Anion gap: 9 (ref 5–15)
BUN: 17 mg/dL (ref 8–23)
CO2: 25 mmol/L (ref 22–32)
Calcium: 8.6 mg/dL — ABNORMAL LOW (ref 8.9–10.3)
Chloride: 106 mmol/L (ref 98–111)
Creatinine, Ser: 1.06 mg/dL — ABNORMAL HIGH (ref 0.44–1.00)
GFR calc Af Amer: 59 mL/min — ABNORMAL LOW
GFR calc non Af Amer: 51 mL/min — ABNORMAL LOW
Glucose, Bld: 110 mg/dL — ABNORMAL HIGH (ref 70–99)
Phosphorus: 3.5 mg/dL (ref 2.5–4.6)
Potassium: 4.2 mmol/L (ref 3.5–5.1)
Sodium: 140 mmol/L (ref 135–145)

## 2019-12-04 LAB — URINE CULTURE

## 2019-12-04 LAB — TSH: TSH: 0.824 u[IU]/mL (ref 0.350–4.500)

## 2019-12-04 MED ORDER — APIXABAN 5 MG PO TABS
5.0000 mg | ORAL_TABLET | Freq: Two times a day (BID) | ORAL | Status: DC
  Filled 2019-12-04: qty 1

## 2019-12-04 MED ORDER — RANOLAZINE ER 500 MG PO TB12: 500.0000 mg | ORAL_TABLET | Freq: Two times a day (BID) | ORAL | Status: DC

## 2019-12-04 MED ORDER — RANOLAZINE ER 500 MG PO TB12: 500.0000 mg | ORAL_TABLET | Freq: Two times a day (BID) | ORAL | 1 refills | Status: DC

## 2019-12-04 MED ORDER — AMIODARONE HCL 200 MG PO TABS: 400.0000 mg | ORAL_TABLET | Freq: Two times a day (BID) | ORAL | 0 refills | Status: DC

## 2019-12-04 MED ORDER — CALCIUM CARBONATE-VITAMIN D 500-200 MG-UNIT PO TABS: 2.0000 | ORAL_TABLET | Freq: Every day | ORAL | 1 refills | Status: DC

## 2019-12-04 NOTE — Progress Notes (Signed)
PROGRESS NOTE  Kayla Harrison XQJ:[id number] DOB: 24-Jul-1946   PCP: [name], MD  Patient is from: Home.  Independently ambulates at baseline.  DOA: 12/02/2019 LOS: 2  Brief Narrative / Interim history: 74 year old female with history of A. Fib/flutter s/p PPM and on Eliquis, NHL s/p chemo in 2018, SVC syndrome, thyroid Ca s/p thyroidectomy, hepatitis and hypertension presenting with recurrent fall in the setting of A. fib with RVR.   Patient had A. fib and cardioverted 2 weeks ago.  She had syncopal episode in the setting of A. fib while taking a shower the day prior to presentation.  Evaluated by EMS but was not brought to ED. She had another fall and hitting the back of the head while walking on the day of admission.  No LOC with the later.  In ED, in A. fib with RVR with HR in 120s.  BP 190/100.  Afebrile.  100% on room air. Cr 1.07.  Hgb 11.8.  Otherwise CBC and CMP without significant finding.  PT/INR 17.7/1.5. HS Trop negative x2.  Alcohol level negative.  EKG with A. fib at 112 and QTc of 510 but no acute ischemic finding.  COVID-19 screen negative.  CXR without acute finding.  CT head revealed a small 7 mm hyperdense focus in the right occipital lobe concerning for hemorrhage or metastatic lesion.  CT cervical/thoracic/lumbar spine concerning for lytic lesion in T1 and C3 vertebra, possibly an acute superior endplate intervertebral disc herniation at T12, DDD, diffuse osteopenia, mildly enlarged mediastinal LAD, cardiomegaly, pulm HTN, mild bilateral hydro and perinephric changes.   Neurosurgery consulted and recommended MRI brain and cervical spine.  Cardiology consulted for PPM interrogation and A. fib with RVR.  Started on Cardizem drip.  Anticoagulation held.   MRI brain and cervical spine negative for ICH or metastatic lesion.  Neurosurgery signed off.  Patient remains inpatient due to A. fib/flutter with RVR.  Currently on high-dose p.o. amiodarone per  cardiology.  Subjective: Seen and examined earlier this morning.  No major events overnight or this morning.  HR remained in 110s to 120s despite high-dose p.o. amiodarone.  No complaints but very eager to go home.  Sore over her back from fall.  Denies headache, vision change, focal numbness, tingling or weakness, chest pain, dyspnea, GI or UTI symptoms.  Objective: Vitals:   12/04/19 1443 12/04/19 1547 12/04/19 1549 12/04/19 1551  BP:  126/79 (!) 138/112 (!) 147/97  Pulse:      Resp: 19 20 (!) 22 19  Temp:      TempSrc:      SpO2:      Weight:      Height:        Intake/Output Summary (Last 24 hours) at 12/04/2019 1556 Last data filed at 12/03/2019 2300 Gross per 24 hour  Intake 50 ml  Output --  Net 50 ml   Filed Weights   12/02/19 1856 12/03/19 0540  Weight: 99.8 kg 102.7 kg    Examination:  GENERAL: No acute distress.  Appears well.  HEENT: MMM.  Vision and hearing grossly intact.  NECK: Supple.  No apparent JVD.  RESP:  No IWOB. Good air movement bilaterally. CVS: Regular rhythm.  HR 120. Heart sounds normal.  ABD/GI/GU: Bowel sounds present. Soft. Non tender.  MSK/EXT:  Moves extremities. No apparent deformity. No edema.  SKIN: no apparent skin lesion or wound NEURO: Awake, alert and oriented appropriately.  CN II-XII intact.  Motor 5/5 in all extremities.  Light sensation intact.  Rapid alternating movements intact.  Patellar reflex symmetric. PSYCH: Calm. Normal affect.  GENERAL: No acute distress.  Appears well.  HEENT: MMM.  Vision and hearing grossly intact.  NECK: Supple.  No apparent JVD.  RESP:  No IWOB. Good air movement bilaterally. CVS: HR in 120s.  Regular rhythm. Heart sounds normal.  ABD/GI/GU: Bowel sounds present. Soft. Non tender.  MSK/EXT:  Moves extremities. No apparent deformity. No edema.  Varicose veins. SKIN: no apparent skin lesion or wound NEURO: Awake, alert and oriented appropriately.  CN II-XII intact.  Motor 5/5 in all extremities.   Light sensation and rapid alternating movements intact.  Patellar reflex symmetric. PSYCH: Calm. Normal affect.  Procedures:  None  Assessment & Plan: Recurrent falls/syncope due to A. fib/flutter with RVR.  Patient with PPM.  On amiodarone and Eliquis at home.  HR in 110s to 120s.  She also has QTC to 510 which could be exaggerated by RVR.  TSH within normal.  PPM interrogation showed multiple episodes of AF.  Echo with normal EF and G2 DD -Cardiology managing-on amiodarone 400 mg twice daily -Eliquis on hold  Prolonged QTc: QTC 510 but could be exaggerated by RVR. -Keep K above 4.0 and Mg above 2.0. -Avoid QT prolonging drugs  Hypertension:  BP 190/100 on arrival.  Normotensive for most part. -Cardiac meds as above   Coronary calcification: Echo with normal EF and G2 DD.  LDL 85.  A1c 5.5%. -Cardiology on board.  Abnormal CT head/cervical spine-CT head showed small 7 mm hyperdense focus in the right occipital lobe concerning for possible ICH or metastatic lesion.  CT cervical spine raise concern about possible lytic lesions in C3 and T1.  MRI brain and cervical spine negative negative for ICH or metastatic lesion. -Neurosurgery signed off.  Possible T2 compression fracture/intervertebral discoordination: noted on thoracic and lumbar CT. neuro exam within normal.  No bowel or bladder issues.   -TLSO brace as needed for pain per NS.   -PT/OT  Osteopenia/possible osteoporosis -Start calcium and vitamin D supplementation -Could benefit from bisphosphonates or other agents for osteoporosis  Mild bilateral hydro-/perinephric stranding-noted on CT. Patient without UTI symptoms, fever, leukocytosis, suprapubic or CVA tenderness.  UA not convincing.  Urine culture with multiple species. -Observing without antibiotics.  Mesenteric/mediastinal and periportal lymphadenopathy/pulmonary nodules and diffusely thickened duodenal and jejunal wall-patient has no constitutional, pulmonary or GI  symptoms.  Unclear if these findings are acute or chronic has no previous imaging to compare to. -Outpatient follow-up with your oncologist, Dr. Lindi Adie  History of NHL: s/p chemotherapy in 2018 -Outpatient follow-up with oncology  Hypothyroidism in patient with thyroid cancer s/p thyroidectomy: TSH within normal. -Continue home Synthroid  Emphysema: Noted on CT chest.  No respiratory symptoms.  Decreased independence and activity tolerance -PT recommended outpatient PT.               DVT prophylaxis: SCD Code Status: Full code Family Communication: Patient and/or RN. Available if any question.  Discharge barrier: A. fib/flutter with RVR Patient is from: Home Final disposition: Likely home once medically stable and cleared by cardiologist.  Consultants: Neurosurgery (off), cardiology   Microbiology summarized: [id number] negative. Urine culture-multiple species.  Sch Meds:  Scheduled Meds: . amiodarone  400 mg Oral BID  . apixaban  5 mg Oral BID  . calcium-vitamin D  2 tablet Oral Q breakfast  . levothyroxine  137 mcg Oral Once per day on Sun Mon Tue Wed Thu Fri  . multivitamin with minerals  1  tablet Oral Q breakfast  . ranolazine  500 mg Oral BID  . [START ON 12/06/2019] rosuvastatin  10 mg Oral Once per day on Mon Thu   Continuous Infusions: PRN Meds:.acetaminophen **OR** acetaminophen, labetalol  Antimicrobials: Anti-infectives (From admission, onward)   None       I have personally reviewed the following labs and images: CBC: Recent Labs  Lab 12/02/19 1732 12/02/19 1751 12/03/19 0339  WBC 5.4  --  7.4  HGB 11.8* 12.6 12.8  HCT 38.5 37.0 40.0  MCV 97.0  --  92.4  PLT 167  --  180   BMP &GFR Recent Labs  Lab 12/02/19 1732 12/02/19 1751 12/02/19 2002 12/03/19 0339 12/04/19 0306  NA 141 140  --  140 140  K 4.3 4.1  --  4.2 4.2  CL 110 112*  --  107 106  CO2 20*  --   --  23 25  GLUCOSE 108* 106*  --  108* 110*  BUN 22 22  --  16 17    CREATININE 1.07* 1.00  --  0.91 1.06*  CALCIUM 8.5*  --   --  8.8* 8.6*  MG  --   --  2.1 1.7 2.3  PHOS  --   --   --  3.6 3.5   Estimated Creatinine Clearance: 55.5 mL/min (A) (by C-G formula based on SCr of 1.06 mg/dL (H)). Liver & Pancreas: Recent Labs  Lab 12/02/19 1732 12/03/19 0339 12/04/19 0306  AST 18  --   --   ALT 19  --   --   ALKPHOS 80  --   --   BILITOT 0.5  --   --   PROT 5.8*  --   --   ALBUMIN 3.0* 2.9* 2.9*   No results for input(s): LIPASE, AMYLASE in the last 168 hours. No results for input(s): AMMONIA in the last 168 hours. Diabetic: Recent Labs    12/04/19 0306  HGBA1C 5.5   No results for input(s): GLUCAP in the last 168 hours. Cardiac Enzymes: No results for input(s): CKTOTAL, CKMB, CKMBINDEX, TROPONINI in the last 168 hours. No results for input(s): PROBNP in the last 8760 hours. Coagulation Profile: Recent Labs  Lab 12/02/19 1732  INR 1.5*   Thyroid Function Tests: Recent Labs    12/04/19 0306  TSH 0.824   Lipid Profile: Recent Labs    12/04/19 0306  CHOL 143  HDL 41  LDLCALC 85  TRIG 87  CHOLHDL 3.5   Anemia Panel: Recent Labs    12/03/19 0339  VITAMINB12 401  FOLATE 29.8  FERRITIN 30  TIBC 336  IRON 51  RETICCTPCT 1.7   Urine analysis:    Component Value Date/Time   COLORURINE YELLOW 12/02/2019 1920   APPEARANCEUR CLEAR 12/02/2019 1920   LABSPEC 1.012 12/02/2019 1920   PHURINE 7.0 12/02/2019 1920   GLUCOSEU NEGATIVE 12/02/2019 1920   HGBUR SMALL (A) 12/02/2019 1920   BILIRUBINUR NEGATIVE 12/02/2019 1920   KETONESUR NEGATIVE 12/02/2019 1920   PROTEINUR NEGATIVE 12/02/2019 1920   NITRITE NEGATIVE 12/02/2019 1920   LEUKOCYTESUR SMALL (A) 12/02/2019 1920   Sepsis Labs: Invalid input(s): PROCALCITONIN, [place]  Microbiology: Recent Results (from the past 240 hour(s))  SARS CORONAVIRUS 2 (TAT 6-24 HRS) Nasopharyngeal Nasopharyngeal Swab     Status: None   Collection Time: 12/02/19  6:17 PM   Specimen:  Nasopharyngeal Swab  Result Value Ref Range Status   SARS Coronavirus 2 NEGATIVE NEGATIVE Final    Comment: (NOTE)  SARS-CoV-2 target nucleic acids are NOT DETECTED. The SARS-CoV-2 RNA is generally detectable in upper and lower respiratory specimens during the acute phase of infection. Negative results do not preclude SARS-CoV-2 infection, do not rule out co-infections with other pathogens, and should not be used as the sole basis for treatment or other patient management decisions. Negative results must be combined with clinical observations, patient history, and epidemiological information. The expected result is Negative. Fact Sheet for Patients: SugarRoll.be Fact Sheet for Healthcare Providers: https://www.woods-mathews.com/ This test is not yet approved or cleared by the [place] FDA and  has been authorized for detection and/or diagnosis of SARS-CoV-2 by FDA under an Emergency Use Authorization (EUA). This EUA will remain  in effect (meaning this test can be used) for the duration of the COVID-19 declaration under Section 56 4(b)(1) of the Act, 21 U.S.C. section 360bbb-3(b)(1), unless the authorization is terminated or revoked sooner. Performed at [facility], [place] [address], [place], [place] [address]   Culture, Urine     Status: Abnormal   Collection Time: 12/02/19  7:20 PM   Specimen: Urine, Random  Result Value Ref Range Status   Specimen Description URINE, RANDOM  Final   Special Requests   Final    NONE Performed at [facility], [place] [address], [place], [place] [address]    Culture MULTIPLE SPECIES PRESENT, SUGGEST RECOLLECTION (A)  Final   Report Status 12/04/2019 FINAL  Final    Radiology Studies: ECHOCARDIOGRAM COMPLETE  Result Date: 12/03/2019    ECHOCARDIOGRAM REPORT   Patient Name:   DAYANI WINBUSH Date of Exam: 12/03/2019 Medical Rec #:  [id number]           Height:       66.0 in Accession #:     [id number]          Weight:       226.4 lb Date of Birth:  04/20/1946            BSA:          2.108 m Patient Age:    27 years            BP:           120/75 mmHg Patient Gender: F                   HR:           104 bpm. Exam Location:  Inpatient Procedure: 2D Echo, Cardiac Doppler and Color Doppler Indications:    Atrial Fibrillation 427.31/I48.9  History:        Patient has prior history of Echocardiogram examinations, most                 recent 08/04/2019. Risk Factors:Hypertension. QT prolongation.  Sonographer:    Clayton Lefort RDCS (AE) Referring Phys: [id number] Charlesetta Ivory Sylvana Bonk IMPRESSIONS  1. Left ventricular ejection fraction, by estimation, is 55 to 60%. The left ventricle has normal function. The left ventricle has no regional wall motion abnormalities. Left ventricular diastolic parameters are consistent with Grade II diastolic dysfunction (pseudonormalization).  2. Right ventricular systolic function is normal. The right ventricular size is normal. There is mildly elevated pulmonary artery systolic pressure.  3. Right atrial size was mildly dilated.  4. The mitral valve is grossly normal. Mild mitral valve regurgitation. No evidence of mitral stenosis.  5. Tricuspid valve regurgitation is mild to moderate.  6. The aortic valve is normal in structure. Aortic  valve regurgitation is not visualized. No aortic stenosis is present. FINDINGS  Left Ventricle: Diastolic dysfunction is present. It is difficult to determine whether it grade II or III. Left ventricular ejection fraction, by estimation, is 55 to 60%. The left ventricle has normal function. The left ventricle has no regional wall motion abnormalities. The left ventricular internal cavity size was normal in size. There is no left ventricular hypertrophy. Left ventricular diastolic parameters are consistent with Grade II diastolic dysfunction (pseudonormalization). Right Ventricle: The right ventricular size is normal. No increase in right ventricular wall  thickness. Right ventricular systolic function is normal. There is mildly elevated pulmonary artery systolic pressure. The tricuspid regurgitant velocity is 2.78  m/s, and with an assumed right atrial pressure of 8 mmHg, the estimated right ventricular systolic pressure is 97.0 mmHg. Left Atrium: Left atrial size was normal in size. Right Atrium: Right atrial size was mildly dilated. Pericardium: There is no evidence of pericardial effusion. Mitral Valve: The mitral valve is grossly normal. Mild mitral valve regurgitation. No evidence of mitral valve stenosis. Tricuspid Valve: The tricuspid valve is normal in structure. Tricuspid valve regurgitation is mild to moderate. No evidence of tricuspid stenosis. Aortic Valve: The aortic valve is normal in structure. Aortic valve regurgitation is not visualized. No aortic stenosis is present. Aortic valve mean gradient measures 5.0 mmHg. Aortic valve peak gradient measures 8.3 mmHg. Aortic valve area, by VTI measures 1.39 cm. Pulmonic Valve: The pulmonic valve was not well visualized. Pulmonic valve regurgitation is not visualized. Aorta: The aortic root and ascending aorta are structurally normal, with no evidence of dilitation. IAS/Shunts: The atrial septum is grossly normal. Additional Comments: A pacer wire is visualized.  LEFT VENTRICLE PLAX 2D LVIDd:         4.12 cm LVIDs:         3.74 cm LV PW:         1.23 cm LV IVS:        0.94 cm LVOT diam:     1.80 cm LV SV:         34 LV SV Index:   16 LVOT Area:     2.54 cm  RIGHT VENTRICLE            IVC RV Basal diam:  2.88 cm    IVC diam: 2.17 cm RV S prime:     4.70 cm/s TAPSE (M-mode): 1.2 cm LEFT ATRIUM             Index       RIGHT ATRIUM           Index LA diam:        3.70 cm 1.76 cm/m  RA Area:     15.60 cm LA Vol (A2C):   61.3 ml 29.08 ml/m RA Volume:   38.90 ml  18.45 ml/m LA Vol (A4C):   45.6 ml 21.63 ml/m LA Biplane Vol: 55.7 ml 26.42 ml/m  AORTIC VALVE AV Area (Vmax):    1.48 cm AV Area (Vmean):   1.45  cm AV Area (VTI):     1.39 cm AV Vmax:           144.00 cm/s AV Vmean:          106.000 cm/s AV VTI:            0.244 m AV Peak Grad:      8.3 mmHg AV Mean Grad:      5.0 mmHg LVOT Vmax:  83.50 cm/s LVOT Vmean:        60.400 cm/s LVOT VTI:          0.133 m LVOT/AV VTI ratio: 0.55  AORTA Ao Root diam: 2.50 cm Ao Asc diam:  3.00 cm TRICUSPID VALVE TR Peak grad:   30.9 mmHg TR Vmax:        278.00 cm/s  SHUNTS Systemic VTI:  0.13 m Systemic Diam: 1.80 cm Mertie Moores MD Electronically signed by Mertie Moores MD Signature Date/Time: 12/03/2019/5:04:23 PM    Final    50 minutes with more than 50% spent in reviewing records, counseling patient/family and coordinating care.   Brietta Manso T. [place]  If 7PM-7AM, please contact night-coverage [email or web address] Password [facility] 12/04/2019, 3:56 PM

## 2019-12-04 NOTE — Discharge Summary (Signed)
Physician Discharge Summary  Kayla Harrison KZL:[id number] DOB: 1946-07-20 DOA: 12/02/2019  PCP: [name], MD  Admit date: 12/02/2019 Discharge date: 12/04/2019  Admitted From: Home Disposition: Home  Recommendations for Outpatient Follow-up:  1. Cardiology to arrange outpatient follow-up in 1 week for cardioversion 2. Please arrange outpatient PT 3. Please obtain CBC/BMP/Mag at follow up 4. Please follow up on the following pending results: None  Home Health: None Equipment/Devices: None  Discharge Condition: Stable CODE STATUS: Full code   Hospital Course: 74 year old female with history of A. Fib/flutter s/p PPM and on Eliquis, NHL s/p chemo in 2018, SVC syndrome, thyroid Ca s/p thyroidectomy, hepatitis and hypertension presenting with recurrent fall in the setting of A. fib with RVR.   Patient had A. fib and cardioverted 2 weeks ago.  She had syncopal episode in the setting of A. fib while taking a shower the day prior to presentation.  Evaluated by EMS but was not brought to ED. She had another fall and hitting the back of the head while walking on the day of admission.  No LOC with the later.  In ED, in A. fib with RVR with HR in 120s.  BP 190/100.  Afebrile.  100% on room air. Cr 1.07.  Hgb 11.8.  Otherwise CBC and CMP without significant finding.  PT/INR 17.7/1.5. HS Trop negative x2.  Alcohol level negative.  EKG with A. fib at 112 and QTc of 510 but no acute ischemic finding.  COVID-19 screen negative.  CXR without acute finding.  CT head revealed a small 7 mm hyperdense focus in the right occipital lobe concerning for hemorrhage or metastatic lesion.  CT cervical/thoracic/lumbar spine concerning for lytic lesion in T1 and C3 vertebra, possibly an acute superior endplate intervertebral disc herniation at T12, DDD, diffuse osteopenia, mildly enlarged mediastinal LAD, cardiomegaly, pulm HTN, mild bilateral hydro and perinephric changes.   Neurosurgery consulted and  recommended MRI brain and cervical spine.  Cardiology consulted for PPM interrogation and A. fib with RVR.  Started on Cardizem drip and transition to p.o. amiodarone 400 mg twice a day.   MRI brain and cervical spine negative for ICH or metastatic lesion.  Neurosurgery signed off.  Patient remained in A flutter with mild RVR with heart rate in 110s to 120s on p.o. amiodarone 400 mg twice a day. She was adamant about going home. Cardiology, Dr. Caryl Comes recommended discharging on amiodarone 400 mg twice a day.  Cardiology to arrange outpatient follow-up next week for cardioversion.  Eliquis resumed prior to discharge.  See individual problem list below for more hospital course.  Discharge Diagnoses:  Recurrent falls/syncope due to A. fib/flutter with RVR.  Patient with PPM.  On amiodarone and Eliquis at home.  HR in 110s to 120s.  She also has QTC to 510 which could be exaggerated by RVR.  TSH within normal.  PPM interrogation showed multiple episodes of AF.  Echo with normal EF and G2 DD.  Patient was adamant about going home. -Discharged on amiodarone 400 mg twice a day after discussion with cardiology -Eliquis 5 mg twice daily for anticoagulation  Prolonged QTc: QTC 510 but could be exaggerated by RVR. -Avoid QT prolonging drugs  Hypertension: BP 190/100 on arrival.  Normotensive for most part. -Cardiac meds as above   Coronary calcification: Echo with normal EF and G2 DD.  LDL 85.  A1c 5.5%. -Started on ranolazine this admission. -Cardiology on board.  Abnormal CT head/cervical spine-CT head showed small 7 mm hyperdense focus  in the right occipital lobe concerning for possible ICH or metastatic lesion.  CT cervical spine raise concern about possible lytic lesions in C3 and T1.  MRI brain and cervical spine negative negative for ICH or metastatic lesion. -Neurosurgery signed off.  Possible T2 compression fracture/intervertebral discoordination:noted on thoracic and lumbar CT. neuro  exam within normal.  No bowel or bladder issues.   -TLSO brace as needed for pain per NS.   -Outpatient PT  Osteopenia/possible osteoporosis -Start calcium and vitamin D supplementation -Could benefit from bisphosphonates or other agents for osteoporosis  Mild bilateral hydro-/perinephric stranding-noted on CT. Patient without UTI symptoms, fever, leukocytosis, suprapubic or CVA tenderness.  UA not convincing.  Urine culture with multiple species. -Observing without antibiotics.  Mesenteric/mediastinal and periportal lymphadenopathy/pulmonary nodules and diffusely thickened duodenal and jejunal wall-patient has no constitutional, pulmonary or GI symptoms.  Unclear if these findings are acute or chronic has no previous imaging to compare to. -Outpatient follow-up with your oncologist, Dr. Lindi Adie  History of NHL: s/p chemotherapy in 2018 -Outpatient follow-up with oncology  Hypothyroidism in patient with thyroid cancer s/p thyroidectomy: TSH within normal. -Continue home Synthroid  Emphysema: Noted on CT chest.  No respiratory symptoms.  Decreased independence and activity tolerance -PT recommended outpatient PT.   Discharge Instructions  Discharge Instructions    (HEART FAILURE PATIENTS) Call MD:  Anytime you have any of the following symptoms: 1) 3 pound weight gain in 24 hours or 5 pounds in 1 week 2) shortness of breath, with or without a dry hacking cough 3) swelling in the hands, feet or stomach 4) if you have to sleep on extra pillows at night in order to breathe.   Complete by: As directed    Call MD for:  difficulty breathing, headache or visual disturbances   Complete by: As directed    Call MD for:  extreme fatigue   Complete by: As directed    Call MD for:  persistant dizziness or light-headedness   Complete by: As directed    Call MD for:  persistant nausea and vomiting   Complete by: As directed    Call MD for:  severe uncontrolled pain   Complete by: As  directed    Diet - low sodium heart healthy   Complete by: As directed    Discharge instructions   Complete by: As directed    It has been a pleasure taking care of you! You were hospitalized with syncope and fall likely due to atrial flutter.  There is some concern about mild compression fracture of the thoracic spine.  This does not require surgery.  We have increased your amiodarone to 400 mg twice a day until you see your cardiologist next week.  Continue your Eliquis. Please review your new medication list and the directions before you take your medications.  Your cardiologist will arrange outpatient follow-up next week.   Please follow-up with your oncologist to discuss about CT finding of lymph nodes in your chest and you abdomen.  Unclear if these are new or old.  Your oncologist might order an additional test to evaluate these findings.   Take care, Dr. Cyndia Skeeters   Increase activity slowly   Complete by: As directed      Allergies as of 12/04/2019      Reactions   Rivaroxaban    Nose Bleed X 6 hrs ; packing in ER Nasal hemorrhage   Tape Other (See Comments)   SKIN IS THIN AND TEARS EASILY  Tikosyn [dofetilide] Other (See Comments)   "Long QT wave"   Epinephrine    Ketamine Other (See Comments)   Hallucinations      Medication List    TAKE these medications   acetaminophen 500 MG tablet Commonly known as: TYLENOL Take 500-1,000 mg by mouth every 6 (six) hours as needed for mild pain or headache.   amiodarone 200 MG tablet Commonly known as: PACERONE Take 2 tablets (400 mg total) by mouth 2 (two) times daily. What changed:   how much to take  when to take this   calcium-vitamin D 500-200 MG-UNIT tablet Commonly known as: OSCAL WITH D Take 2 tablets by mouth daily with breakfast. Start taking on: December 05, 2019   Eliquis 5 MG Tabs tablet Generic drug: apixaban Take 5 mg by mouth 2 (two) times daily.   furosemide 20 MG tablet Commonly known as:  LASIX Take 20 mg by mouth daily as needed for fluid or edema.   levothyroxine 137 MCG tablet Commonly known as: SYNTHROID Take 137 mcg by mouth See admin instructions. Take 137 mcg by mouth in the morning before breakfast on Sun/Mon/Tues/Wed/Thurs/Fri (nothing on Saturdays)   One-A-Day Womens 50+ Advantage Tabs Take 1 tablet by mouth daily with breakfast.   ranolazine 500 MG 12 hr tablet Commonly known as: RANEXA Take 1 tablet (500 mg total) by mouth 2 (two) times daily.   rosuvastatin 10 MG tablet Commonly known as: CRESTOR Take 10 mg by mouth See admin instructions. Take 10 mg by mouth on Mondays and Thursdays   Xiidra 5 % Soln Generic drug: Lifitegrast Place 1 drop into both eyes every evening.       Consultations:  Neurosurgery, cardiology  Procedures/Studies:  2D Echo on on 12/03/2019 1. Left ventricular ejection fraction, by estimation, is 55 to 60%. The  left ventricle has normal function. The left ventricle has no regional  wall motion abnormalities. Left ventricular diastolic parameters are  consistent with Grade II diastolic  dysfunction (pseudonormalization).  2. Right ventricular systolic function is normal. The right ventricular  size is normal. There is mildly elevated pulmonary artery systolic  pressure.  3. Right atrial size was mildly dilated.  4. The mitral valve is grossly normal. Mild mitral valve regurgitation.  No evidence of mitral stenosis.  5. Tricuspid valve regurgitation is mild to moderate.  6. The aortic valve is normal in structure. Aortic valve regurgitation is  not visualized. No aortic stenosis is present.    CT Head Wo Contrast  Result Date: 12/02/2019 CLINICAL DATA:  Level 2 trauma. Fall hitting the back of the head. EXAM: CT HEAD WITHOUT CONTRAST TECHNIQUE: Contiguous axial images were obtained from the base of the skull through the vertex without intravenous contrast. Automatic exposure control utilized. COMPARISON:  January 02, 2019 FINDINGS: Brain: A hyperdense 7 mm focus in the right occipital lobe cortical gray matter on series 3, image 15 and series 5, image 61 which is new and concerning for an acute intraparenchymal hemorrhage. A chronic linear density in the left temporal convexity on series 3, image 13 is unchanged and probably vascular in etiology. There is no abnormal extra-axial fluid collection, midline shift, mass effect, vascular territory distribution infarction. Chronic left caudate lacunar infarction and bilateral cerebral microvascular white matter ischemic changes are similar. There is redemonstration of basal ganglia calcifications. Vascular: Atherosclerotic calcifications including along a right sylvian fissure and anterior parafalcine vessel. Skull: No acute skull fracture. No subgaleal hematoma. Sinuses/Orbits: Mild bilateral frontal and ethmoidal mucosal  thickening. No posttraumatic air-fluid levels. Normal bilateral orbital soft tissues. Other: Metallic dental artifact degrades image quality, particularly in the posterior fossa. I discussed critical Value/emergent results and the need for surveillance imaging by telephone at the time of interpretation on 12/02/2019 at 6:45 pm with provider [facility] , who verbally acknowledged these results. IMPRESSION: A small 7 mm hyperdense focus in the right occipital lobe is concerning for an acute intraparenchymal hemorrhage. An underlying hemorrhagic metastatic lesion is a differential consideration. Surveillance CT brain without and with intravenous contrast recommended in 2-3 months for further evaluation given the cardiac pacemaker. No acute skull fracture. Chronic microvascular ischemic changes in both cerebral hemispheres and left lacunar infarction. Electronically Signed   By: Revonda Humphrey   On: 12/02/2019 18:46   CT CHEST W CONTRAST  Result Date: 12/03/2019 CLINICAL DATA:  Lymphoma. EXAM: CT CHEST, ABDOMEN, AND PELVIS WITH CONTRAST TECHNIQUE: Multidetector CT  imaging of the chest, abdomen and pelvis was performed following the standard protocol during bolus administration of intravenous contrast. Automatic exposure control utilized. CONTRAST:  126mL OMNIPAQUE IOHEXOL 300 MG/ML  SOLN COMPARISON:  August 10 2018. CT imaging of the cervical, thoracic and lumbar spine earlier on the same date. FINDINGS: CT CHEST FINDINGS Cardiovascular: Mild cardiomegaly with coarse aortic valvular calcification and atrioventricular cardiac pacemaker with left-sided battery pack. Pulmonary trunk measuring 3.3 cm. Coarse long segment LAD and left circumflex coronary calcifications. Mild left main coronary and moderate right main coronary calcification. Mediastinum/Nodes: Thyroidectomy surgical clips. Redemonstration a mildly enlarged mediastinal lymph node measuring 12 mm short axis diameter, noncalcified. Small hiatal hernia through a gastric lap band. No adenopathy of the bilateral axilla or lower neck or chest wall. Lungs/Pleura: Similar sizes of a small right and trace left pleural effusion compared to earlier on the same date. Fibrotic bronchiectasis in the medial right upper and middle lobes, likely sequela of remote radiation therapy. Mild centrilobular emphysema. Patchy ground-glass opacities in both lungs. A couple bilateral small pulmonary nodules measuring up to 6 mm similar to November 2019. A right basilar 14 mm and 13 mm nodule which were present on the 2019 study are not re-evaluated, at least partially obscured by the pleural effusion, which could be malignant. Musculoskeletal: Redemonstration of diffusely abnormal permeative bone pattern throughout the skeleton and several small lytic and sclerotic osseous lesions reported on spinal imaging performed earlier today. Right shoulder arthroplasty. CT ABDOMEN PELVIS FINDINGS Hepatobiliary: Borderline hepatomegaly. Chronic mild intrahepatic biliary duct dilatation measuring up to 11 mm and chronic 17 mm proximal common bile duct  with normal distal tapering and no calcified choledocholithiasis. Stable size of a 13 mm simple hepatic cyst. Normal smooth hepatic contours with a couple benign granulomatous calcifications. Surgically absent or decompressed gallbladder. Pancreas: Questionable pancreatic divisum, coronal series 6, image 62. Diffuse mild atrophy. No acute pancreatitis. No apparent dilatation of the main pancreatic duct. Spleen: Normally sized spleen with benign granulomatous calcifications. 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 Adrenals/Urinary Tract: Mild thickening of the bilateral adrenal glands without overt nodularity. Normal sizes of both kidneys. Nonobstructing bilateral renal calculi. Symmetrical medical renal disease. Abnormal enhancement of the bilateral ureteral uroepithelium and improved but persistent bilateral hydronephrosis. No right or left ureteral calculus. Stomach/Bowel: Gastric lap band placement, the band appropriately positioned at a 45 degree angle to the horizontal. No evidence of gastric erosion. Mostly decompressed gastric and small bowel with circumferential moderate mucosal thickening of the duodenum and proximal jejunum concerning for lymphomatous infiltration given the lack of  adjacent mesenteric edema. Distal colonic diverticulosis. Large amount of stool in the colon and rectum. Surgically absent or decompressed appendix. Vascular/Lymphatic: Thoracoabdominal aorto bi-iliac calcified atherosclerosis without aneurysmal dilatation. Small scattered retroperitoneal lymph nodes. A mildly enlarged periportal node measuring 20 x 14 mm. An abnormally increased number of clustered tiny mesenteric lymph nodes most confluent at the mesenteric root and lesser sac where there is subtle fat stranding, consistent with the patient history of lymphoma. Reproductive: Hysterectomy.  No adnexal cyst or mass. Other: A subcutaneous fat plane ovoid soft tissue with central calcification abutting  the dermal surface without adjacent inflammatory change, probably a benign sebaceous cyst in the midline lower back soft tissues measuring 13 mm. Musculoskeletal: Redemonstration of diffusely abnormal permeative bone pattern throughout the skeleton and several small lytic and sclerotic osseous lesions reported on spinal imaging performed earlier today. IMPRESSION: An abnormal number of clustered small lymph nodes in the mesenteric root and lesser sac and mesenteric misting in the lesser sac. Mildly enlarged mediastinal and periportal lymph node. Abnormal diffusely thickened duodenal and proximal jejunal bowel wall. Diffuse permeative bone pattern with several small suspicious osseous lesions. These findings are consistent with the patient history of lymphoma. A small right pleural effusion and trace left pleural effusion, possibly bland or malignant. The former obscuring reevaluation of the prior suspicious 14 mm and 13 mm right basilar nodules. Stable left basilar 7 mm subsolid nodule. An additional number of smaller bilateral pulmonary nodules, some probably sequela of benign granulomatous disease. A concommitant lymphomatous plaque etiology, primary lung cancer, or metastatic nodule are also differential considerations. Benign granulomatous disease of the spleen and liver. Gastric lap band appropriate placement with small hiatal herniation, but no evidence of gastric erosion. Improved but persistent bilateral mild hydronephrosis with abnormal enhancement of the bilateral ureteral uroepithelium concerning for an acute upper urinary tract infection. Emphysema and aortic calcified atherosclerosis. 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 Mild cardiomegaly with four-vessel coronary calcification, severe along the LAD and left circumflex arteries. Pulmonary artery hypertension. Chronic prominent biliary dilatation without apparent dilatation of the main pancreatic duct. Possible pancreatic divisum.  Small subdermal lesion of the midline lower back, possibly a benign sebaceous cyst. Electronically Signed   By: Revonda Humphrey   On: 12/03/2019 00:33   CT Cervical Spine Wo Contrast  Result Date: 12/02/2019 CLINICAL DATA:  Level 2 trauma. Fall with neck pain. EXAM: CT CERVICAL SPINE WITHOUT CONTRAST TECHNIQUE: Multidetector CT imaging of the cervical spine was performed without intravenous contrast. Multiplanar CT image reconstructions were also generated. COMPARISON:  July 29, 2018 FINDINGS: Alignment: Degenerative appearing grade 1 retrolisthesis of C5 on C6 and C6 on C7. Reversal of the cervical lordosis. Skull base and vertebrae: No acute fracture. Degenerative anterior wedging and endplate moderate hypertrophy of the mid cervical levels. Soft tissues and spinal canal: No prevertebral fluid or swelling. No visible canal hematoma. Disc levels: No posttraumatic disruption of the disc spaces. Diffuse cervical degenerative disc space narrowing, advanced from C5 through C7. Upper chest: Centrilobular emphysema with bilateral upper lobe bronchitis, small right 4 Hounsfield unit dependent pleural effusion, and patchy ground-glass opacity and airspace disease in the medial upper lobes, possibly infectious. Aortic arch calcified atherosclerosis. Thyroidectomy surgical clips. Bilateral carotid calcified atherosclerosis. Partial imaging of a cardiac pacemaker. A benign granulomatous calcification in the left lung apex. Other: Diffuse bone demineralization. A 6 mm T1 and a 5 mm C3 ovoid lytic lesion, concerning for possible metastatic disease to the skeleton. IMPRESSION: 1. No acute fracture or malalignment  of the cervical spine. 2. Two suspicious small lytic lesions in the T1 and C3 vertebra, possibly osteolytic metastasis to the skeleton. 3. Centrilobular emphysema with bilateral upper lobe bronchitis, small right pleural effusion, and patchy pulmonary disease, possibly infectious. 4. Aortic atherosclerosis  (ICD10-I70.0) and Emphysema (ICD10-J43.9). 5. Advanced cervical degenerative disc disease. 6. Diffuse bone demineralization. Electronically Signed   By: Revonda Humphrey   On: 12/02/2019 19:04   CT Thoracic Spine Wo Contrast  Result Date: 12/02/2019 CLINICAL DATA:  Level 2 trauma.  Fall with back pain. EXAM: CT THORACIC SPINE WITHOUT CONTRAST TECHNIQUE: Multidetector CT images of the thoracic were obtained using the standard protocol without intravenous contrast. Automatic exposure control utilized. COMPARISON:  CT chest dated August 13, 2019 FINDINGS: Alignment: Normal. Vertebrae: Interval T12 superior endplate intravertebral disc herniation, possibly acute. Chronic mild compression deformities of T12, and the lower cervical vertebra without an acute compression fracture or fracture lucency. Redemonstration of abnormal diffuse permeative bone marrow pattern. Suspicious small lytic and sclerotic lesion measuring 6 mm in the T1 vertebra. Paraspinal and other soft tissues: Mildly enlarged precarinal lymph node measuring 12 mm short axis diameter, noncalcified. Cardiomegaly with pulmonary artery hypertension, cardiac pacemaker, coarse mitral annulus calcification and coarse coronary calcifications. Small right 7 Hounsfield unit pleural effusion. Trace left pleural fluid. Central pulmonary vascular congestion and patchy airspace and small airways disease in both lungs, bilateral bronchitis, centrilobular emphysema, and right middle lobe fibrosis, likely sequela from prior radiation therapy. Disc levels: No posttraumatic widening or disruption. Intervertebral vacuum disc phenomenon at T11-T12. IMPRESSION: Possibly acute superior endplate intravertebral disc herniation at T12. Other chronic degenerative osseous findings. Diffuse abnormal permeative bone marrow pattern, possibly aggressive osteopenia or a benign or malignant marrow infiltrating disease. Small suspicious 6 mm lytic and sclerotic lesion in the T1  vertebra, possibly metastatic. Nonspecific mildly enlarged mediastinal lymph node that could be benign or metastatic. Cardiomegaly and pulmonary artery hypertension with coarse coronary calcifications, trace left pleural effusion, small right pleural effusion, mild pulmonary edema, bilateral bronchitis and possible superimposed infectious pneumonitis. Sequela of prior radiation therapy in the right middle lobe. Thoracoabdominal aortic calcified atherosclerosis and emphysema. Electronically Signed   By: Revonda Humphrey   On: 12/02/2019 19:35   CT Lumbar Spine Wo Contrast  Result Date: 12/02/2019 CLINICAL DATA:  Level 2 trauma.  Fall with back pain. EXAM: CT LUMBAR SPINE WITHOUT CONTRAST TECHNIQUE: Multidetector CT imaging of the lumbar spine was performed without intravenous contrast administration. Multiplanar CT image reconstructions were also generated. Automatic exposure control utilized. COMPARISON:  January 02, 2019. FINDINGS: Segmentation: Five non-rib-bearing lumbar vertebral bodies. Alignment: Grade 1 retrolisthesis of L3 on L4, unchanged and likely degenerative. Straightening of the lumbar lordosis. Vertebrae: Interval T12 small intravertebral disc herniation. Similar chronic mild 10% compression deformities of the T12 and L4 superior endplates. Diffuse permeative bone pattern. A 5 mm L2 and T11 suspicious lytic lesion. An L2 5 mm sclerotic lesion, unchanged. Paraspinal and other soft tissues: Centrilobular emphysema. Remote gastric banding with hiatal hernia. Mild bilateral hydronephrosis with mild perinephric and peri ureteral inflammatory change, possibly infectious or secondary to medical renal disease. Nonobstructing left and right renal calcifications. Benign splenic granulomatous calcification. No retroperitoneal hematoma. Aortic calcified atherosclerosis without an abdominal aortic aneurysm. Disc levels: No posttraumatic disruption. Diffuse mild and moderate acquired disc disease and hypertrophic  bone spurring. IMPRESSION: Interval T12 superior endplate intravertebral disc herniation, which could be acute. No acute bony injury of the lumbar spine. Diffusely abnormal permeative bone marrow pattern concerning for  aggressive osteopenia or a benign or malignant marrow infiltrating disease. A couple small lytic and sclerotic osseous lesions, stable compared to April 2020. Lumbar muscle spasm and chronic degenerative changes and acquired disc disease. Aortic calcified atherosclerosis and centrilobular emphysema. Mild bilateral hydronephrosis with perinephric and peri-ureteral inflammatory changes and nonobstructing bilateral renal calculi. Recommend correlation with urinalysis to exclude an upper urinary tract infection. Electronically Signed   By: Revonda Humphrey   On: 12/02/2019 19:20   MR Brain W and Wo Contrast  Result Date: 12/03/2019 CLINICAL DATA:  Fall with head injury.  History of lymphoma EXAM: MRI HEAD WITHOUT AND WITH CONTRAST TECHNIQUE: Multiplanar, multiecho pulse sequences of the brain and surrounding structures were obtained without and with intravenous contrast. CONTRAST:  85mL GADAVIST GADOBUTROL 1 MMOL/ML IV SOLN COMPARISON:  CT head 12/02/2019 FINDINGS: Brain: Negative for acute infarct. No area of hemorrhage in the right occipital lobe as questioned on CT. No subdural hematoma. Atrophy and chronic microvascular ischemic changes throughout the white matter. Chronic microhemorrhage left thalamus. No fluid collection or midline shift Normal enhancement postcontrast infusion. Vascular: Normal arterial flow voids Skull and upper cervical spine: No focal skeletal lesion. Sinuses/Orbits: Paranasal sinuses clear. Bilateral cataract extraction. Other: None IMPRESSION: No acute abnormality. No acute infarct or mass. No evidence of acute hemorrhage in the right occipital lobe as questioned on CT Atrophy with moderate chronic microvascular ischemic changes in the white matter. Chronic microhemorrhage  left thalamus likely due to hypertension. Electronically Signed   By: Franchot Gallo M.D.   On: 12/03/2019 13:50   MR CERVICAL SPINE W WO CONTRAST  Result Date: 12/03/2019 CLINICAL DATA:  Fall.  Head injury.  History of lymphoma. EXAM: MRI CERVICAL SPINE WITHOUT AND WITH CONTRAST TECHNIQUE: Multiplanar and multiecho pulse sequences of the cervical spine, to include the craniocervical junction and cervicothoracic junction, were obtained without and with intravenous contrast. CONTRAST:  70mL GADAVIST GADOBUTROL 1 MMOL/ML IV SOLN COMPARISON:  CT cervical spine 12/02/2019. MRI cervical spine 07/29/2018 FINDINGS: Alignment: Motion degraded study Normal alignment with straightening of the cervical lordosis Vertebrae: Negative for fracture or mass. No bone marrow lesion or abnormal enhancement. Small lesion in the T1 vertebral body question on CT does not show significant marrow abnormality on MRI and does not show any abnormal enhancement. Cord: Allowing for motion, no cord signal abnormality or abnormal enhancement is identified. Posterior Fossa, vertebral arteries, paraspinal tissues: No cervical adenopathy or mass. Disc levels: C2-3: Negative C3-4: Mild disc degeneration and spurring. Mild facet degeneration. Mild foraminal narrowing bilaterally C4-5: Disc degeneration and mild spurring.  No significant stenosis C5-6: Disc degeneration and moderate uncinate spurring centrally and to the right. Cord flattening on the right with mild spinal stenosis. Mild foraminal narrowing bilaterally due to spurring C6-7: Small left paracentral disc protrusion and spurring with cord flattening on the left with mild spinal stenosis. Mild to moderate foraminal stenosis bilaterally C7-T1: Mild disc degeneration and spurring IMPRESSION: Negative for cervical spine fracture or mass. T1 vertebral body lesion on CT does not appear to represent tumor on MRI Cervical spondylosis with spinal and foraminal stenosis as above. Electronically  Signed   By: Franchot Gallo M.D.   On: 12/03/2019 14:02   CT ABDOMEN PELVIS W CONTRAST  Result Date: 12/03/2019 CLINICAL DATA:  Lymphoma. EXAM: CT CHEST, ABDOMEN, AND PELVIS WITH CONTRAST TECHNIQUE: Multidetector CT imaging of the chest, abdomen and pelvis was performed following the standard protocol during bolus administration of intravenous contrast. Automatic exposure control utilized. CONTRAST:  129mL OMNIPAQUE  IOHEXOL 300 MG/ML  SOLN COMPARISON:  August 10 2018. CT imaging of the cervical, thoracic and lumbar spine earlier on the same date. FINDINGS: CT CHEST FINDINGS Cardiovascular: Mild cardiomegaly with coarse aortic valvular calcification and atrioventricular cardiac pacemaker with left-sided battery pack. Pulmonary trunk measuring 3.3 cm. Coarse long segment LAD and left circumflex coronary calcifications. Mild left main coronary and moderate right main coronary calcification. Mediastinum/Nodes: Thyroidectomy surgical clips. Redemonstration a mildly enlarged mediastinal lymph node measuring 12 mm short axis diameter, noncalcified. Small hiatal hernia through a gastric lap band. No adenopathy of the bilateral axilla or lower neck or chest wall. Lungs/Pleura: Similar sizes of a small right and trace left pleural effusion compared to earlier on the same date. Fibrotic bronchiectasis in the medial right upper and middle lobes, likely sequela of remote radiation therapy. Mild centrilobular emphysema. Patchy ground-glass opacities in both lungs. A couple bilateral small pulmonary nodules measuring up to 6 mm similar to November 2019. A right basilar 14 mm and 13 mm nodule which were present on the 2019 study are not re-evaluated, at least partially obscured by the pleural effusion, which could be malignant. Musculoskeletal: Redemonstration of diffusely abnormal permeative bone pattern throughout the skeleton and several small lytic and sclerotic osseous lesions reported on spinal imaging performed earlier  today. Right shoulder arthroplasty. CT ABDOMEN PELVIS FINDINGS Hepatobiliary: Borderline hepatomegaly. Chronic mild intrahepatic biliary duct dilatation measuring up to 11 mm and chronic 17 mm proximal common bile duct with normal distal tapering and no calcified choledocholithiasis. Stable size of a 13 mm simple hepatic cyst. Normal smooth hepatic contours with a couple benign granulomatous calcifications. Surgically absent or decompressed gallbladder. Pancreas: Questionable pancreatic divisum, coronal series 6, image 62. Diffuse mild atrophy. No acute pancreatitis. No apparent dilatation of the main pancreatic duct. Spleen: Normally sized spleen with benign granulomatous calcifications. 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 Adrenals/Urinary Tract: Mild thickening of the bilateral adrenal glands without overt nodularity. Normal sizes of both kidneys. Nonobstructing bilateral renal calculi. Symmetrical medical renal disease. Abnormal enhancement of the bilateral ureteral uroepithelium and improved but persistent bilateral hydronephrosis. No right or left ureteral calculus. Stomach/Bowel: Gastric lap band placement, the band appropriately positioned at a 45 degree angle to the horizontal. No evidence of gastric erosion. Mostly decompressed gastric and small bowel with circumferential moderate mucosal thickening of the duodenum and proximal jejunum concerning for lymphomatous infiltration given the lack of adjacent mesenteric edema. Distal colonic diverticulosis. Large amount of stool in the colon and rectum. Surgically absent or decompressed appendix. Vascular/Lymphatic: Thoracoabdominal aorto bi-iliac calcified atherosclerosis without aneurysmal dilatation. Small scattered retroperitoneal lymph nodes. A mildly enlarged periportal node measuring 20 x 14 mm. An abnormally increased number of clustered tiny mesenteric lymph nodes most confluent at the mesenteric root and lesser sac where  there is subtle fat stranding, consistent with the patient history of lymphoma. Reproductive: Hysterectomy.  No adnexal cyst or mass. Other: A subcutaneous fat plane ovoid soft tissue with central calcification abutting the dermal surface without adjacent inflammatory change, probably a benign sebaceous cyst in the midline lower back soft tissues measuring 13 mm. Musculoskeletal: Redemonstration of diffusely abnormal permeative bone pattern throughout the skeleton and several small lytic and sclerotic osseous lesions reported on spinal imaging performed earlier today. IMPRESSION: An abnormal number of clustered small lymph nodes in the mesenteric root and lesser sac and mesenteric misting in the lesser sac. Mildly enlarged mediastinal and periportal lymph node. Abnormal diffusely thickened duodenal and proximal jejunal bowel wall.  Diffuse permeative bone pattern with several small suspicious osseous lesions. These findings are consistent with the patient history of lymphoma. A small right pleural effusion and trace left pleural effusion, possibly bland or malignant. The former obscuring reevaluation of the prior suspicious 14 mm and 13 mm right basilar nodules. Stable left basilar 7 mm subsolid nodule. An additional number of smaller bilateral pulmonary nodules, some probably sequela of benign granulomatous disease. A concommitant lymphomatous plaque etiology, primary lung cancer, or metastatic nodule are also differential considerations. Benign granulomatous disease of the spleen and liver. Gastric lap band appropriate placement with small hiatal herniation, but no evidence of gastric erosion. Improved but persistent bilateral mild hydronephrosis with abnormal enhancement of the bilateral ureteral uroepithelium concerning for an acute upper urinary tract infection. Emphysema and aortic calcified atherosclerosis. 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 Mild cardiomegaly with four-vessel coronary  calcification, severe along the LAD and left circumflex arteries. Pulmonary artery hypertension. Chronic prominent biliary dilatation without apparent dilatation of the main pancreatic duct. Possible pancreatic divisum. Small subdermal lesion of the midline lower back, possibly a benign sebaceous cyst. Electronically Signed   By: Revonda Humphrey   On: 12/03/2019 00:33   DG Chest Port 1 View  Result Date: 12/02/2019 CLINICAL DATA:  Trauma. Fall from standing.  On anticoagulation. EXAM: PORTABLE CHEST 1 VIEW COMPARISON:  Radiograph 04/02/2019 FINDINGS: Borderline cardiomegaly. Post left atrial clipping. Mediastinal contours are unchanged. Left-sided pacemaker with leads unchanged in position. No focal airspace disease. No pneumothorax. No large pleural effusion. No pulmonary edema. Reverse right shoulder arthroplasty. No acute osseous abnormalities are seen. Surgical clips at the thoracic inlet. IMPRESSION: No evidence of acute traumatic injury to the thorax. Electronically Signed   By: Keith Rake M.D.   On: 12/02/2019 18:10   ECHOCARDIOGRAM COMPLETE  Result Date: 12/03/2019    ECHOCARDIOGRAM REPORT   Patient Name:   Kayla Harrison Date of Exam: 12/03/2019 Medical Rec #:  [id number]           Height:       66.0 in Accession #:    [id number]          Weight:       226.4 lb Date of Birth:  [birth year]            BSA:          2.108 m Patient Age:    74 years            BP:           120/75 mmHg Patient Gender: F                   HR:           104 bpm. Exam Location:  Inpatient Procedure: 2D Echo, Cardiac Doppler and Color Doppler Indications:    Atrial Fibrillation 427.31/I48.9  History:        Patient has prior history of Echocardiogram examinations, most                 recent 08/04/2019. Risk Factors:Hypertension. QT prolongation.  Sonographer:    Clayton Lefort RDCS (AE) Referring Phys: [id number] Charlesetta Ivory Elliana Bal IMPRESSIONS  1. Left ventricular ejection fraction, by estimation, is 55 to 60%. The left ventricle  has normal function. The left ventricle has no regional wall motion abnormalities. Left ventricular diastolic parameters are consistent with Grade II diastolic dysfunction (pseudonormalization).  2. Right ventricular systolic function is normal. The right ventricular size is normal. There  is mildly elevated pulmonary artery systolic pressure.  3. Right atrial size was mildly dilated.  4. The mitral valve is grossly normal. Mild mitral valve regurgitation. No evidence of mitral stenosis.  5. Tricuspid valve regurgitation is mild to moderate.  6. The aortic valve is normal in structure. Aortic valve regurgitation is not visualized. No aortic stenosis is present. FINDINGS  Left Ventricle: Diastolic dysfunction is present. It is difficult to determine whether it grade II or III. Left ventricular ejection fraction, by estimation, is 55 to 60%. The left ventricle has normal function. The left ventricle has no regional wall motion abnormalities. The left ventricular internal cavity size was normal in size. There is no left ventricular hypertrophy. Left ventricular diastolic parameters are consistent with Grade II diastolic dysfunction (pseudonormalization). Right Ventricle: The right ventricular size is normal. No increase in right ventricular wall thickness. Right ventricular systolic function is normal. There is mildly elevated pulmonary artery systolic pressure. The tricuspid regurgitant velocity is 2.78  m/s, and with an assumed right atrial pressure of 8 mmHg, the estimated right ventricular systolic pressure is 16.1 mmHg. Left Atrium: Left atrial size was normal in size. Right Atrium: Right atrial size was mildly dilated. Pericardium: There is no evidence of pericardial effusion. Mitral Valve: The mitral valve is grossly normal. Mild mitral valve regurgitation. No evidence of mitral valve stenosis. Tricuspid Valve: The tricuspid valve is normal in structure. Tricuspid valve regurgitation is mild to moderate. No  evidence of tricuspid stenosis. Aortic Valve: The aortic valve is normal in structure. Aortic valve regurgitation is not visualized. No aortic stenosis is present. Aortic valve mean gradient measures 5.0 mmHg. Aortic valve peak gradient measures 8.3 mmHg. Aortic valve area, by VTI measures 1.39 cm. Pulmonic Valve: The pulmonic valve was not well visualized. Pulmonic valve regurgitation is not visualized. Aorta: The aortic root and ascending aorta are structurally normal, with no evidence of dilitation. IAS/Shunts: The atrial septum is grossly normal. Additional Comments: A pacer wire is visualized.  LEFT VENTRICLE PLAX 2D LVIDd:         4.12 cm LVIDs:         3.74 cm LV PW:         1.23 cm LV IVS:        0.94 cm LVOT diam:     1.80 cm LV SV:         34 LV SV Index:   16 LVOT Area:     2.54 cm  RIGHT VENTRICLE            IVC RV Basal diam:  2.88 cm    IVC diam: 2.17 cm RV S prime:     4.70 cm/s TAPSE (M-mode): 1.2 cm LEFT ATRIUM             Index       RIGHT ATRIUM           Index LA diam:        3.70 cm 1.76 cm/m  RA Area:     15.60 cm LA Vol (A2C):   61.3 ml 29.08 ml/m RA Volume:   38.90 ml  18.45 ml/m LA Vol (A4C):   45.6 ml 21.63 ml/m LA Biplane Vol: 55.7 ml 26.42 ml/m  AORTIC VALVE AV Area (Vmax):    1.48 cm AV Area (Vmean):   1.45 cm AV Area (VTI):     1.39 cm AV Vmax:           144.00 cm/s AV Vmean:  106.000 cm/s AV VTI:            0.244 m AV Peak Grad:      8.3 mmHg AV Mean Grad:      5.0 mmHg LVOT Vmax:         83.50 cm/s LVOT Vmean:        60.400 cm/s LVOT VTI:          0.133 m LVOT/AV VTI ratio: 0.55  AORTA Ao Root diam: 2.50 cm Ao Asc diam:  3.00 cm TRICUSPID VALVE TR Peak grad:   30.9 mmHg TR Vmax:        278.00 cm/s  SHUNTS Systemic VTI:  0.13 m Systemic Diam: 1.80 cm Mertie Moores MD Electronically signed by Mertie Moores MD Signature Date/Time: 12/03/2019/5:04:23 PM    Final        Discharge Exam: Vitals:   12/04/19 1549 12/04/19 1551  BP: (!) 138/112 (!) 147/97  Pulse:      Resp: (!) 22 19  Temp:    SpO2:      GENERAL: No acute distress.  Appears well.  HEENT: MMM.  Vision and hearing grossly intact.  NECK: Supple.  No apparent JVD.  RESP:  No IWOB. Good air movement bilaterally. CVS: HR in 120s.  Regular rhythm. Heart sounds normal.  ABD/GI/GU: Bowel sounds present. Soft. Non tender.  MSK/EXT:  Moves extremities. No apparent deformity or edema.  Varicose veins SKIN: no apparent skin lesion or wound NEURO: Awake, alert and oriented appropriately.  CN II-XII intact.  Motor 5/5 in all extremities.  Speech clear.  Light sensation and rapid alternating movements intact.  Patellar reflex symmetric. PSYCH: Calm. Normal affect.    The results of significant diagnostics from this hospitalization (including imaging, microbiology, ancillary and laboratory) are listed below for reference.     Microbiology: Recent Results (from the past 240 hour(s))  SARS CORONAVIRUS 2 (TAT 6-24 HRS) Nasopharyngeal Nasopharyngeal Swab     Status: None   Collection Time: 12/02/19  6:17 PM   Specimen: Nasopharyngeal Swab  Result Value Ref Range Status   SARS Coronavirus 2 NEGATIVE NEGATIVE Final    Comment: (NOTE) SARS-CoV-2 target nucleic acids are NOT DETECTED. The SARS-CoV-2 RNA is generally detectable in upper and lower respiratory specimens during the acute phase of infection. Negative results do not preclude SARS-CoV-2 infection, do not rule out co-infections with other pathogens, and should not be used as the sole basis for treatment or other patient management decisions. Negative results must be combined with clinical observations, patient history, and epidemiological information. The expected result is Negative. Fact Sheet for Patients: SugarRoll.be Fact Sheet for Healthcare Providers: https://www.woods-mathews.com/ This test is not yet approved or cleared by the [place] FDA and  has been authorized for detection  and/or diagnosis of SARS-CoV-2 by FDA under an Emergency Use Authorization (EUA). This EUA will remain  in effect (meaning this test can be used) for the duration of the COVID-19 declaration under Section 56 4(b)(1) of the Act, 21 U.S.C. section 360bbb-3(b)(1), unless the authorization is terminated or revoked sooner. Performed at [facility], [place] [address] [place], [place] [address]   Culture, Urine     Status: Abnormal   Collection Time: 12/02/19  7:20 PM   Specimen: Urine, Random  Result Value Ref Range Status   Specimen Description URINE, RANDOM  Final   Special Requests   Final    NONE Performed at [facility], [place] [address] [place], [place] [address]  Culture MULTIPLE SPECIES PRESENT, SUGGEST RECOLLECTION (A)  Final   Report Status 12/04/2019 FINAL  Final     Labs: BNP (last 3 results) No results for input(s): BNP in the last 8760 hours. Basic Metabolic Panel: Recent Labs  Lab 12/02/19 1732 12/02/19 1751 12/02/19 2002 12/03/19 0339 12/04/19 0306  NA 141 140  --  140 140  K 4.3 4.1  --  4.2 4.2  CL 110 112*  --  107 106  CO2 20*  --   --  23 25  GLUCOSE 108* 106*  --  108* 110*  BUN 22 22  --  16 17  CREATININE 1.07* 1.00  --  0.91 1.06*  CALCIUM 8.5*  --   --  8.8* 8.6*  MG  --   --  2.1 1.7 2.3  PHOS  --   --   --  3.6 3.5   Liver Function Tests: Recent Labs  Lab 12/02/19 1732 12/03/19 0339 12/04/19 0306  AST 18  --   --   ALT 19  --   --   ALKPHOS 80  --   --   BILITOT 0.5  --   --   PROT 5.8*  --   --   ALBUMIN 3.0* 2.9* 2.9*   No results for input(s): LIPASE, AMYLASE in the last 168 hours. No results for input(s): AMMONIA in the last 168 hours. CBC: Recent Labs  Lab 12/02/19 1732 12/02/19 1751 12/03/19 0339  WBC 5.4  --  7.4  HGB 11.8* 12.6 12.8  HCT 38.5 37.0 40.0  MCV 97.0  --  92.4  PLT 167  --  180   Cardiac Enzymes: No results for input(s): CKTOTAL, CKMB, CKMBINDEX, TROPONINI in the last 168  hours. BNP: Invalid input(s): POCBNP CBG: No results for input(s): GLUCAP in the last 168 hours. D-Dimer No results for input(s): DDIMER in the last 72 hours. Hgb A1c Recent Labs    12/04/19 0306  HGBA1C 5.5   Lipid Profile Recent Labs    12/04/19 0306  CHOL 143  HDL 41  LDLCALC 85  TRIG 87  CHOLHDL 3.5   Thyroid function studies Recent Labs    12/04/19 0306  TSH 0.824   Anemia work up Recent Labs    12/03/19 0339  VITAMINB12 401  FOLATE 29.8  FERRITIN 30  TIBC 336  IRON 51  RETICCTPCT 1.7   Urinalysis    Component Value Date/Time   COLORURINE YELLOW 12/02/2019 1920   APPEARANCEUR CLEAR 12/02/2019 1920   LABSPEC 1.012 12/02/2019 1920   PHURINE 7.0 12/02/2019 1920   GLUCOSEU NEGATIVE 12/02/2019 1920   HGBUR SMALL (A) 12/02/2019 1920   BILIRUBINUR NEGATIVE 12/02/2019 1920   KETONESUR NEGATIVE 12/02/2019 1920   PROTEINUR NEGATIVE 12/02/2019 1920   NITRITE NEGATIVE 12/02/2019 1920   LEUKOCYTESUR SMALL (A) 12/02/2019 1920   Sepsis Labs Invalid input(s): PROCALCITONIN,  WBC,  LACTICIDVEN   Time coordinating discharge: 35 minutes  SIGNED:  Mercy Riding, MD  [facility] 12/04/2019, 4:18 PM  If 7PM-7AM, please contact night-coverage [email or web address] Password [email or web address]

## 2019-12-04 NOTE — Discharge Instructions (Signed)

## 2019-12-04 NOTE — Evaluation (Signed)
Physical Therapy Evaluation Patient Details Name: Kayla Harrison MRN: [id number] DOB: 1945/09/30 Today's Date: 12/04/2019   History of Present Illness  74 y.o. female w/ hx of recurrent A. fib/flutter s/p PPM on Eliquis, non-Hodgkin's lymphoma s/p chemo (2018), SVC syndrome, thyroid cancer s/p thyroidectomy, hypertension, hyperlipidemia, hepatitis presenting to the ED after a fall. Initial fall in shower EMS did not bring to hospital, fell again 2 days later and hit head w/o LOC. Pt dx w/ intercerebral hemorrhage (ICH).    Clinical Impression   Pt admitted with above diagnosis. PT lived home alone, but has some family and friends who check in on her daily. States was using cane indoors and 4WW outdoors fr for prolonged outing (>29mins). Pt currently with functional limitations due to the deficits listed below (see PT Problem List). This am pt did very well with mobility during session, as able to complete bed mob and transfers with independence, ambulated in hall approx 267ft with RW and supervision. Pt present with decreased independence and activity tolerance also increased pain as per own reporting. Pt will benefit from skilled PT to increase her independence and safety with mobility to allow discharge to the venue listed below.       Follow Up Recommendations Outpatient PT(to address balance and coordination to dec falls)    Equipment Recommendations  None recommended by PT    Recommendations for Other Services       Precautions / Restrictions Precautions Precautions: Fall Precaution Comments: multi falls at home Restrictions Weight Bearing Restrictions: No      Mobility  Bed Mobility Overal bed mobility: Independent                Transfers Overall transfer level: Modified independent Equipment used: Rolling walker (2 wheeled);None                Ambulation/Gait Ambulation/Gait assistance: Supervision Gait Distance (Feet): 200 Feet Assistive device:  Rolling walker (2 wheeled) Gait Pattern/deviations: Step-through pattern Gait velocity: dec   General Gait Details: ambulated on room air with RW, HR inceased to 120s max with ambulation  Stairs            Wheelchair Mobility    Modified Rankin (Stroke Patients Only)       Balance Overall balance assessment: Mild deficits observed, not formally tested                                           Pertinent Vitals/Pain Pain Assessment: 0-10 Pain Score: 4  Pain Location: bottom when she sits down (had to scoot to get to phone when she fell) Pain Intervention(s): Limited activity within patient's tolerance;Monitored during session    [place] expects to be discharged to:: Private residence Living Arrangements: Alone Available Help at Discharge: Friend(s);Neighbor Type of Home: House       Home Layout: One level Home Equipment: [place] - 4 wheels;Cane - single point Additional Comments: Uses rollator when out and about, cane when out in yard and to get from car to grocery cart    Prior Function Level of Independence: Independent with assistive device(s)               Hand Dominance   Dominant Hand: Right    Extremity/Trunk Assessment   Upper Extremity Assessment Upper Extremity Assessment: Defer to OT evaluation    Lower Extremity Assessment Lower Extremity Assessment: Overall  WFL for tasks assessed    Cervical / Trunk Assessment Cervical / Trunk Assessment: Normal  Communication   Communication: No difficulties  Cognition Arousal/Alertness: Awake/alert Behavior During Therapy: WFL for tasks assessed/performed Overall Cognitive Status: Within Functional Limits for tasks assessed                                        General Comments      Exercises     Assessment/Plan    PT Assessment Patient needs continued PT services  PT Problem List Decreased activity tolerance;Decreased safety  awareness;Decreased mobility;Decreased coordination       PT Treatment Interventions DME instruction;Gait training;Functional mobility training;Therapeutic activities;Therapeutic exercise;Balance training;Neuromuscular re-education    PT Goals (Current goals can be found in the Care Plan section)  Acute Rehab PT Goals Patient Stated Goal: to go home today PT Goal Formulation: With patient Time For Goal Achievement: 12/18/19 Potential to Achieve Goals: Good    Frequency Min 3X/week   Barriers to discharge        Co-evaluation               AM-PAC PT "6 Clicks" Mobility  Outcome Measure Help needed turning from your back to your side while in a flat bed without using bedrails?: None Help needed moving from lying on your back to sitting on the side of a flat bed without using bedrails?: None Help needed moving to and from a bed to a chair (including a wheelchair)?: None Help needed standing up from a chair using your arms (e.g., wheelchair or bedside chair)?: None Help needed to walk in hospital room?: A Little Help needed climbing 3-5 steps with a railing? : A Little 6 Click Score: 22    End of Session   Activity Tolerance: Patient tolerated treatment well Patient left: in bed;with call bell/phone within reach Nurse Communication: Mobility status PT Visit Diagnosis: Other abnormalities of gait and mobility (R26.89);History of falling (Z91.81)    Time: 1410-3013 PT Time Calculation (min) (ACUTE ONLY): 23 min   Charges:   PT Evaluation $PT Eval Low Complexity: 1 Low PT Treatments $Gait Training: 8-22 mins        Horald Chestnut, PT   Delford Field 12/04/2019, 9:19 AM

## 2019-12-04 NOTE — Progress Notes (Signed)
ANTICOAGULATION CONSULT NOTE - Initial Consult  Pharmacy Consult for Eliquis Indication: atrial fibrillation  Allergies  Allergen Reactions  . Rivaroxaban     Nose Bleed X 6 hrs ; packing in ER Nasal hemorrhage  . Tape Other (See Comments)    SKIN IS THIN AND TEARS EASILY   . Tikosyn [Dofetilide] Other (See Comments)    "Long QT wave"   . Epinephrine   . Ketamine Other (See Comments)    Hallucinations     Patient Measurements: Height: 5\' 6"  (167.6 cm) Weight: 226 lb 6.6 oz (102.7 kg) IBW/kg (Calculated) : 59.3   Vital Signs:    Labs: Recent Labs    12/02/19 1732 12/02/19 1732 12/02/19 1751 12/02/19 1933 12/03/19 0339 12/04/19 0306  HGB 11.8*   < > 12.6  --  12.8  --   HCT 38.5  --  37.0  --  40.0  --   PLT 167  --   --   --  180  --   LABPROT 17.7*  --   --   --   --   --   INR 1.5*  --   --   --   --   --   CREATININE 1.07*   < > 1.00  --  0.91 1.06*  TROPONINIHS 5  --   --  8  --   --    < > = values in this interval not displayed.    Estimated Creatinine Clearance: 55.5 mL/min (A) (by C-G formula based on SCr of 1.06 mg/dL (H)).   Medical History: History reviewed. No pertinent past medical history.   Assessment: CC/HPI: Syncope in shower day before admit. She had another fall and hitting the back of the head while walking on the day of admission.   CT head revealed a small 7 mm hyperdense focus in the right occipital lobe concerning for hemorrhage or metastatic lesion.  CT cervical/thoracic/lumbar spine concerning for lytic lesion in T1 and C3 vertebra  PMH:  A. Fib/flutter s/p PPM and on Eliquis, NHL s/p chemo in 2018, SVC syndrome, thyroid Ca s/p thyroidectomy, hepatitis and hypertension, CV 2 wks ago.   Anticoag: Eliquis PTA for afib. Fell PTA d/t syncope and hit head > small ICH, no intervention needed per Neurosurgery. No reversal. Resume Eliquis 3/13. Scr 1. Hgb 12.8 stable  Goal of Therapy:  Therapeutic oral anticoagulation   Plan:    Resume Eliquis 5mg  BID    Shone Leventhal S. Alford Highland, PharmD, BCPS Clinical Staff Pharmacist [email or web address] Alford Highland, Johne Buckle Stillinger 12/04/2019,3:17 PM

## 2019-12-04 NOTE — Care Management (Signed)
Referral placed for outpatient physical therapy.

## 2019-12-04 NOTE — Plan of Care (Signed)
  Problem: Education: Goal: Knowledge of disease or condition will improve Outcome: Adequate for Discharge Goal: Understanding of medication regimen will improve Outcome: Adequate for Discharge Goal: Individualized Educational Video(s) Outcome: Adequate for Discharge   Problem: Cardiac: Goal: Ability to achieve and maintain adequate cardiopulmonary perfusion will improve Outcome: Adequate for Discharge   Problem: Health Behavior/Discharge Planning: Goal: Ability to safely manage health-related needs after discharge will improve Outcome: Adequate for Discharge   Problem: Clinical Measurements: Goal: Ability to maintain clinical measurements within normal limits will improve Outcome: Adequate for Discharge   Problem: Coping: Goal: Level of anxiety will decrease Outcome: Adequate for Discharge   Problem: Pain Managment: Goal: General experience of comfort will improve Outcome: Adequate for Discharge   Problem: Safety: Goal: Ability to remain free from injury will improve Outcome: Adequate for Discharge

## 2019-12-06 ENCOUNTER — Ambulatory Visit (INDEPENDENT_AMBULATORY_CARE_PROVIDER_SITE_OTHER): Payer: Medicare Other | Admitting: *Deleted

## 2019-12-06 ENCOUNTER — Encounter (HOSPITAL_COMMUNITY): Payer: Self-pay | Admitting: Nurse Practitioner

## 2019-12-06 DIAGNOSIS — Z950 Presence of cardiac pacemaker: Secondary | ICD-10-CM

## 2019-12-07 ENCOUNTER — Telehealth (HOSPITAL_COMMUNITY): Payer: Self-pay | Admitting: *Deleted

## 2019-12-07 ENCOUNTER — Encounter (HOSPITAL_COMMUNITY): Payer: Medicare Other

## 2019-12-07 NOTE — Telephone Encounter (Signed)
Called to check on patient who was hospitalized for a recent fall.  Physical therapy has been ordered to assist patient in balance issues, she is being discharged from pulmonary rehab due to balance issues and a high fall risk.

## 2019-12-08 ENCOUNTER — Ambulatory Visit (INDEPENDENT_AMBULATORY_CARE_PROVIDER_SITE_OTHER): Payer: Medicare Other | Admitting: Physical Therapy

## 2019-12-08 ENCOUNTER — Telehealth: Payer: Self-pay

## 2019-12-08 ENCOUNTER — Other Ambulatory Visit: Payer: Self-pay

## 2019-12-08 ENCOUNTER — Encounter: Payer: Self-pay | Admitting: Physical Therapy

## 2019-12-08 ENCOUNTER — Other Ambulatory Visit (HOSPITAL_COMMUNITY)
Admission: RE | Admit: 2019-12-08 | Discharge: 2019-12-08 | Disposition: A | Discharge status: Home / Self Care | Payer: Medicare Other | Source: Ambulatory Visit | Attending: Cardiology | Admitting: Cardiology

## 2019-12-08 DIAGNOSIS — R2689 Other abnormalities of gait and mobility: Secondary | ICD-10-CM | POA: Diagnosis not present

## 2019-12-08 DIAGNOSIS — Z01812 Encounter for preprocedural laboratory examination: Secondary | ICD-10-CM | POA: Insufficient documentation

## 2019-12-08 DIAGNOSIS — Z20822 Contact with and (suspected) exposure to covid-19: Secondary | ICD-10-CM | POA: Diagnosis not present

## 2019-12-08 LAB — CUP PACEART REMOTE DEVICE CHECK
Battery Remaining Longevity: 40 mo
Battery Voltage: 2.97 V
Brady Statistic AP VP Percent: 1.85 %
Brady Statistic AP VS Percent: 1.86 %
Brady Statistic AS VP Percent: 1.22 %
Brady Statistic AS VS Percent: 95.07 %
Brady Statistic RA Percent Paced: 3.6 %
Brady Statistic RV Percent Paced: 3.12 %
Date Time Interrogation Session: 20210317122247
Implantable Lead Implant Date: 20140618
Implantable Lead Implant Date: 20140618
Implantable Lead Location: 753859
Implantable Lead Location: 753860
Implantable Pulse Generator Implant Date: 20140618
Lead Channel Impedance Value: 1007 Ohm
Lead Channel Impedance Value: 361 Ohm
Lead Channel Impedance Value: 418 Ohm
Lead Channel Impedance Value: 950 Ohm
Lead Channel Pacing Threshold Amplitude: 0.875 V
Lead Channel Pacing Threshold Amplitude: 1.125 V
Lead Channel Pacing Threshold Pulse Width: 0.4 ms
Lead Channel Pacing Threshold Pulse Width: 0.4 ms
Lead Channel Sensing Intrinsic Amplitude: 1 mV
Lead Channel Sensing Intrinsic Amplitude: 1 mV
Lead Channel Sensing Intrinsic Amplitude: 9.875 mV
Lead Channel Sensing Intrinsic Amplitude: 9.875 mV
Lead Channel Setting Pacing Amplitude: 2.5 V
Lead Channel Setting Pacing Amplitude: 2.5 V
Lead Channel Setting Pacing Pulse Width: 0.4 ms
Lead Channel Setting Sensing Sensitivity: 0.9 mV

## 2019-12-08 LAB — SARS CORONAVIRUS 2 (TAT 6-24 HRS): SARS Coronavirus 2: NEGATIVE

## 2019-12-08 NOTE — Progress Notes (Signed)
PPM Remote  

## 2019-12-08 NOTE — Telephone Encounter (Signed)
Attempted phone call to pt and left message re: scheduling Cardioversion.  Will attempt call back later this morning.

## 2019-12-08 NOTE — Telephone Encounter (Signed)
Spoke with pt and advised of Cardioversion date and time 12/10/2019 with arrival at 630am.  Pt advised instruction letter sent through [facility].  Pt verbalizes understanding and agrees with current plan.

## 2019-12-08 NOTE — Telephone Encounter (Signed)
Spoke with patient to remind of missed remote transmission 

## 2019-12-09 ENCOUNTER — Encounter (HOSPITAL_COMMUNITY): Payer: Medicare Other

## 2019-12-09 NOTE — Progress Notes (Signed)
Discharge Progress Report  Patient Details  Name: Kayla Harrison MRN: [id number] Date of Birth: 05/13/1946 Referring Provider:     Pulmonary Rehab Walk Test from 08/24/2019 in [facility]  Referring Provider  Dr. Aundra Dubin       Number of Visits: 11  Reason for Discharge:  Early Exit:  is seeing physical therapy for 2 recent falls and balance issue  Smoking History:  Social History   Tobacco Use  Smoking Status Never Smoker  Smokeless Tobacco Never Used    Diagnosis:  Heart failure, diastolic, chronic ([place])  ADL [facility]: Pulmonary Assessment Scores    Row Name 08/24/19 1548 08/24/19 1603       ADL [facility]   ADL Phase  Entry  Entry    SOB Score total  --  42      CAT Score   CAT Score  --  14      mMRC Score   mMRC Score  4  --       Initial Exercise Prescription: Initial Exercise Prescription - 08/24/19 1600      Date of Initial Exercise RX and Referring Provider   Date  08/24/19    Referring Provider  Dr. Aundra Dubin      NuStep   Level  2    SPM  80    Minutes  15      Arm Ergometer   Level  1    RPM  30    Minutes  15      Prescription Details   Frequency (times per week)  2    Duration  Progress to 30 minutes of continuous aerobic without signs/symptoms of physical distress      Intensity   THRR 40-80% of Max Heartrate  58-115    Ratings of Perceived Exertion  11-15    Perceived Dyspnea  0-4      Progression   Progression  Continue to progress workloads to maintain intensity without signs/symptoms of physical distress.      Resistance Training   Training Prescription  Yes    Weight  orange bands    Reps  10-15       Discharge Exercise Prescription (Final Exercise Prescription Changes): Exercise Prescription Changes - 11/30/19 1400      Response to Exercise   Blood Pressure (Admit)  120/76    Blood Pressure (Exercise)  120/68    Blood Pressure (Exit)  118/84    Heart Rate (Admit)  81 bpm    Heart Rate  (Exercise)  91 bpm    Heart Rate (Exit)  80 bpm    Oxygen Saturation (Admit)  96 %    Oxygen Saturation (Exercise)  96 %    Oxygen Saturation (Exit)  96 %    Rating of Perceived Exertion (Exercise)  14    Perceived Dyspnea (Exercise)  2    Duration  Continue with 30 min of aerobic exercise without signs/symptoms of physical distress.    Intensity  THRR unchanged      Progression   Progression  Continue to progress workloads to maintain intensity without signs/symptoms of physical distress.      Resistance Training   Training Prescription  Yes    Weight  orange bands    Reps  10-15    Time  10 Minutes      Interval Training   Interval Training  No      Recumbant Bike   Level  1.5  Minutes  15    METs  1.9      NuStep   Level  3    SPM  80    Minutes  15    METs  1.8       Functional Capacity: 6 Minute Walk    Row Name 08/24/19 1548         6 Minute Walk   Phase  Initial     Distance  884 feet     Walk Time  6 minutes     # of Rest Breaks  0     MPH  1.67     METS  1.41     RPE  17     Perceived Dyspnea   3     VO2 Peak  4.92     Symptoms  Yes (comment)     Comments  used walking cane     Resting HR  70 bpm     Resting BP  122/76     Resting Oxygen Saturation   100 %     Exercise Oxygen Saturation  during 6 min walk  93 %     Max Ex. HR  99 bpm     Max Ex. BP  146/80     2 Minute Post BP  116/70       Interval HR   1 Minute HR  87     2 Minute HR  99     3 Minute HR  94     4 Minute HR  95     5 Minute HR  94     6 Minute HR  96     2 Minute Post HR  78     Interval Heart Rate?  Yes       Interval Oxygen   Interval Oxygen?  Yes     Baseline Oxygen Saturation %  100 %     1 Minute Oxygen Saturation %  99 %     1 Minute Liters of Oxygen  0 L     2 Minute Oxygen Saturation %  98 %     2 Minute Liters of Oxygen  0 L     3 Minute Oxygen Saturation %  94 %     3 Minute Liters of Oxygen  0 L     4 Minute Oxygen Saturation %  93 %     4 Minute  Liters of Oxygen  0 L     5 Minute Oxygen Saturation %  98 %     5 Minute Liters of Oxygen  0 L     6 Minute Oxygen Saturation %  93 %     6 Minute Liters of Oxygen  0 L     2 Minute Post Oxygen Saturation %  99 %     2 Minute Post Liters of Oxygen  0 L        Psychological, QOL, Others - Outcomes: PHQ 2/9: Depression screen PHQ 2/9 08/24/2019  Decreased Interest 0  Down, Depressed, Hopeless 0  PHQ - 2 Score 0  Altered sleeping 1  Tired, decreased energy 1  Change in appetite 0  Feeling bad or failure about yourself  0  Trouble concentrating 0  Moving slowly or fidgety/restless 0  Suicidal thoughts 0  Difficult doing work/chores Not difficult at all  Some recent data might be hidden    Quality of Life:   Personal Goals: Goals established at  orientation with interventions provided to work toward goal. Personal Goals and Risk Factors at Admission - 08/24/19 1608      Core Components/Risk Factors/Patient Goals on Admission   Improve shortness of breath with ADL's  Yes    Intervention  Provide education, individualized exercise plan and daily activity instruction to help decrease symptoms of SOB with activities of daily living.    Expected Outcomes  Short Term: Improve cardiorespiratory fitness to achieve a reduction of symptoms when performing ADLs;Long Term: Be able to perform more ADLs without symptoms or delay the onset of symptoms        Personal Goals Discharge: Goals and Risk Factor Review    Row Name 08/24/19 1609 09/06/19 1314 11/08/19 1410         Core Components/Risk Factors/Patient Goals Review   Personal Goals Review  Develop more efficient breathing techniques such as purse lipped breathing and diaphragmatic breathing and practicing self-pacing with activity.;Improve shortness of breath with ADL's  Improve shortness of breath with ADL's;Increase knowledge of respiratory medications and ability to use respiratory devices properly.;Develop more efficient  breathing techniques such as purse lipped breathing and diaphragmatic breathing and practicing self-pacing with activity.;Heart Failure  Develop more efficient breathing techniques such as purse lipped breathing and diaphragmatic breathing and practicing self-pacing with activity.;Improve shortness of breath with ADL's;Increase knowledge of respiratory medications and ability to use respiratory devices properly.;Heart Failure     Review  --  Patient just started pulmonary rehab, has attended 1 exercise session, heart failure was stable at this time, will continue to evaluate, too early to have achieved program goals.  Department closure x 7 weeks, will reopen 04/14/2020, will work with Patrici Ranks to continue working on admission goals.     Expected Outcomes  --  See admission goals.  See admission goals.        Exercise Goals and Review: Exercise Goals    Row Name 08/24/19 1613 09/06/19 1012 11/09/19 0911         Exercise Goals   Increase Physical Activity  Yes  Yes  Yes     Intervention  Provide advice, education, support and counseling about physical activity/exercise needs.;Develop an individualized exercise prescription for aerobic and resistive training based on initial evaluation findings, risk stratification, comorbidities and participant's personal goals.  Provide advice, education, support and counseling about physical activity/exercise needs.;Develop an individualized exercise prescription for aerobic and resistive training based on initial evaluation findings, risk stratification, comorbidities and participant's personal goals.  Provide advice, education, support and counseling about physical activity/exercise needs.;Develop an individualized exercise prescription for aerobic and resistive training based on initial evaluation findings, risk stratification, comorbidities and participant's personal goals.     Expected Outcomes  Short Term: Attend rehab on a regular basis to increase amount of  physical activity.;Long Term: Add in home exercise to make exercise part of routine and to increase amount of physical activity.;Long Term: Exercising regularly at least 3-5 days a week.  Short Term: Attend rehab on a regular basis to increase amount of physical activity.;Long Term: Add in home exercise to make exercise part of routine and to increase amount of physical activity.;Long Term: Exercising regularly at least 3-5 days a week.  Short Term: Attend rehab on a regular basis to increase amount of physical activity.;Long Term: Add in home exercise to make exercise part of routine and to increase amount of physical activity.;Long Term: Exercising regularly at least 3-5 days a week.     Increase Strength and Stamina  Yes  Yes  Yes     Intervention  Provide advice, education, support and counseling about physical activity/exercise needs.;Develop an individualized exercise prescription for aerobic and resistive training based on initial evaluation findings, risk stratification, comorbidities and participant's personal goals.  Provide advice, education, support and counseling about physical activity/exercise needs.;Develop an individualized exercise prescription for aerobic and resistive training based on initial evaluation findings, risk stratification, comorbidities and participant's personal goals.  Provide advice, education, support and counseling about physical activity/exercise needs.;Develop an individualized exercise prescription for aerobic and resistive training based on initial evaluation findings, risk stratification, comorbidities and participant's personal goals.     Expected Outcomes  Short Term: Increase workloads from initial exercise prescription for resistance, speed, and METs.;Short Term: Perform resistance training exercises routinely during rehab and add in resistance training at home;Long Term: Improve cardiorespiratory fitness, muscular endurance and strength as measured by increased METs  and functional capacity (6MWT)  Short Term: Increase workloads from initial exercise prescription for resistance, speed, and METs.;Short Term: Perform resistance training exercises routinely during rehab and add in resistance training at home;Long Term: Improve cardiorespiratory fitness, muscular endurance and strength as measured by increased METs and functional capacity (6MWT)  Short Term: Increase workloads from initial exercise prescription for resistance, speed, and METs.;Short Term: Perform resistance training exercises routinely during rehab and add in resistance training at home;Long Term: Improve cardiorespiratory fitness, muscular endurance and strength as measured by increased METs and functional capacity (6MWT)     Able to understand and use rate of perceived exertion (RPE) scale  Yes  Yes  Yes     Intervention  Provide education and explanation on how to use RPE scale  Provide education and explanation on how to use RPE scale  Provide education and explanation on how to use RPE scale     Expected Outcomes  Short Term: Able to use RPE daily in rehab to express subjective intensity level;Long Term:  Able to use RPE to guide intensity level when exercising independently  Short Term: Able to use RPE daily in rehab to express subjective intensity level;Long Term:  Able to use RPE to guide intensity level when exercising independently  Short Term: Able to use RPE daily in rehab to express subjective intensity level;Long Term:  Able to use RPE to guide intensity level when exercising independently     Able to understand and use Dyspnea scale  Yes  Yes  Yes     Intervention  Provide education and explanation on how to use Dyspnea scale  Provide education and explanation on how to use Dyspnea scale  Provide education and explanation on how to use Dyspnea scale     Expected Outcomes  Short Term: Able to use Dyspnea scale daily in rehab to express subjective sense of shortness of breath during exertion;Long  Term: Able to use Dyspnea scale to guide intensity level when exercising independently  Short Term: Able to use Dyspnea scale daily in rehab to express subjective sense of shortness of breath during exertion;Long Term: Able to use Dyspnea scale to guide intensity level when exercising independently  Short Term: Able to use Dyspnea scale daily in rehab to express subjective sense of shortness of breath during exertion;Long Term: Able to use Dyspnea scale to guide intensity level when exercising independently     Knowledge and understanding of Target Heart Rate Range (THRR)  Yes  Yes  Yes     Expected Outcomes  Short Term: Able to state/look up THRR;Short Term: Able to use daily  as guideline for intensity in rehab;Long Term: Able to use THRR to govern intensity when exercising independently  Short Term: Able to state/look up THRR;Short Term: Able to use daily as guideline for intensity in rehab;Long Term: Able to use THRR to govern intensity when exercising independently  Short Term: Able to state/look up THRR;Short Term: Able to use daily as guideline for intensity in rehab;Long Term: Able to use THRR to govern intensity when exercising independently     Understanding of Exercise Prescription  Yes  Yes  Yes     Intervention  Provide education, explanation, and written materials on patient's individual exercise prescription  Provide education, explanation, and written materials on patient's individual exercise prescription  Provide education, explanation, and written materials on patient's individual exercise prescription     Expected Outcomes  Short Term: Able to explain program exercise prescription;Long Term: Able to explain home exercise prescription to exercise independently  Short Term: Able to explain program exercise prescription;Long Term: Able to explain home exercise prescription to exercise independently  Short Term: Able to explain program exercise prescription;Long Term: Able to explain home  exercise prescription to exercise independently        Exercise Goals Re-Evaluation: Exercise Goals Re-Evaluation    [place] Name 09/06/19 1013 11/09/19 0912           Exercise Goal Re-Evaluation   Exercise Goals Review  Increase Physical Activity;Increase Strength and Stamina;Able to understand and use rate of perceived exertion (RPE) scale;Able to understand and use Dyspnea scale;Knowledge and understanding of Target Heart Rate Range (THRR);Understanding of Exercise Prescription  Increase Physical Activity;Increase Strength and Stamina;Able to understand and use rate of perceived exertion (RPE) scale;Able to understand and use Dyspnea scale;Knowledge and understanding of Target Heart Rate Range (THRR);Understanding of Exercise Prescription      Comments  Pt has completed 1 exercise session. Pt exercised at 1.5 METs on the stepper. Will continue to monitor and progress as able.  It is unknown if pt has exercised since we ceased in person exercise, as she did not wish to be called. Pt will return 2/23. Will monitor and progress as able.      Expected Outcomes  Through exercise at rehab and at home, the patient will decrease shortness of breath with daily activities and feel confident in carrying out an exercise regime at home.  Through exercise at rehab and at home, the patient will decrease shortness of breath with daily activities and feel confident in carrying out an exercise regime at home.         Nutrition & Weight - Outcomes: Pre Biometrics - 08/24/19 1504      Pre Biometrics   Height  5\' 6"  (1.676 m)    Weight  103.5 kg    BMI (Calculated)  36.85    Grip Strength  18.5 kg        Nutrition: Nutrition Therapy & Goals - 09/02/19 1513      Nutrition Therapy   Diet  Mediterranean      Personal Nutrition Goals   Nutrition Goal  Pt to identify and limit food sources of saturated fat, trans fat, refined carbohydrates and sodium    Personal Goal #2  Pt to identify food quantities  necessary to achieve weight loss of 6-24 lb at graduation from cardiac rehab.      Intervention Plan   Intervention  Nutrition handout(s) given to patient.;Prescribe, educate and counsel regarding individualized specific dietary modifications aiming towards targeted core components such as weight, hypertension,  lipid management, diabetes, heart failure and other comorbidities.    Expected Outcomes  Short Term Goal: Understand basic principles of dietary content, such as calories, fat, sodium, cholesterol and nutrients.;Short Term Goal: A plan has been developed with personal nutrition goals set during dietitian appointment.;Long Term Goal: Adherence to prescribed nutrition plan.       Nutrition Discharge: Nutrition Assessments - 09/02/19 1522      Rate Your Plate Scores   Pre Score  60       Education Questionnaire Score:   Goals reviewed with patient; copy given to patient.

## 2019-12-09 NOTE — Progress Notes (Signed)
Discharge Progress Report  Patient Details  Name: Kayla Harrison MRN: [id number] Date of Birth: 1946-05-14 Referring Provider:     Pulmonary Rehab Walk Test from 08/24/2019 in [place]  Referring Provider  Dr. Aundra Dubin       Number of Visits: 7  Reason for Discharge:  Early Exit:  Lack of attendance  Smoking History:  Social History   Tobacco Use  Smoking Status Never Smoker  Smokeless Tobacco Never Used    Diagnosis:  Heart failure, diastolic, chronic ([place])  ADL [facility]: Pulmonary Assessment Scores    Row Name 08/24/19 1548 08/24/19 1603       ADL [facility]   ADL Phase  Entry  Entry    SOB Score total  --  42      CAT Score   CAT Score  --  14      mMRC Score   mMRC Score  4  --       Initial Exercise Prescription: Initial Exercise Prescription - 08/24/19 1600      Date of Initial Exercise RX and Referring Provider   Date  08/24/19    Referring Provider  Dr. Aundra Dubin      NuStep   Level  2    SPM  80    Minutes  15      Arm Ergometer   Level  1    RPM  30    Minutes  15      Prescription Details   Frequency (times per week)  2    Duration  Progress to 30 minutes of continuous aerobic without signs/symptoms of physical distress      Intensity   THRR 40-80% of Max Heartrate  58-115    Ratings of Perceived Exertion  11-15    Perceived Dyspnea  0-4      Progression   Progression  Continue to progress workloads to maintain intensity without signs/symptoms of physical distress.      Resistance Training   Training Prescription  Yes    Weight  orange bands    Reps  10-15       Discharge Exercise Prescription (Final Exercise Prescription Changes): Exercise Prescription Changes - 11/30/19 1400      Response to Exercise   Blood Pressure (Admit)  120/76    Blood Pressure (Exercise)  120/68    Blood Pressure (Exit)  118/84    Heart Rate (Admit)  81 bpm    Heart Rate (Exercise)  91 bpm    Heart Rate (Exit)  80 bpm     Oxygen Saturation (Admit)  96 %    Oxygen Saturation (Exercise)  96 %    Oxygen Saturation (Exit)  96 %    Rating of Perceived Exertion (Exercise)  14    Perceived Dyspnea (Exercise)  2    Duration  Continue with 30 min of aerobic exercise without signs/symptoms of physical distress.    Intensity  THRR unchanged      Progression   Progression  Continue to progress workloads to maintain intensity without signs/symptoms of physical distress.      Resistance Training   Training Prescription  Yes    Weight  orange bands    Reps  10-15    Time  10 Minutes      Interval Training   Interval Training  No      Recumbant Bike   Level  1.5    Minutes  15    METs  1.9      NuStep   Level  3    SPM  80    Minutes  15    METs  1.8       Functional Capacity: 6 Minute Walk    Row Name 08/24/19 1548         6 Minute Walk   Phase  Initial     Distance  884 feet     Walk Time  6 minutes     # of Rest Breaks  0     MPH  1.67     METS  1.41     RPE  17     Perceived Dyspnea   3     VO2 Peak  4.92     Symptoms  Yes (comment)     Comments  used walking cane     Resting HR  70 bpm     Resting BP  122/76     Resting Oxygen Saturation   100 %     Exercise Oxygen Saturation  during 6 min walk  93 %     Max Ex. HR  99 bpm     Max Ex. BP  146/80     2 Minute Post BP  116/70       Interval HR   1 Minute HR  87     2 Minute HR  99     3 Minute HR  94     4 Minute HR  95     5 Minute HR  94     6 Minute HR  96     2 Minute Post HR  78     Interval Heart Rate?  Yes       Interval Oxygen   Interval Oxygen?  Yes     Baseline Oxygen Saturation %  100 %     1 Minute Oxygen Saturation %  99 %     1 Minute Liters of Oxygen  0 L     2 Minute Oxygen Saturation %  98 %     2 Minute Liters of Oxygen  0 L     3 Minute Oxygen Saturation %  94 %     3 Minute Liters of Oxygen  0 L     4 Minute Oxygen Saturation %  93 %     4 Minute Liters of Oxygen  0 L     5 Minute Oxygen  Saturation %  98 %     5 Minute Liters of Oxygen  0 L     6 Minute Oxygen Saturation %  93 %     6 Minute Liters of Oxygen  0 L     2 Minute Post Oxygen Saturation %  99 %     2 Minute Post Liters of Oxygen  0 L        Psychological, QOL, Others - Outcomes: PHQ 2/9: Depression screen PHQ 2/9 08/24/2019  Decreased Interest 0  Down, Depressed, Hopeless 0  PHQ - 2 Score 0  Altered sleeping 1  Tired, decreased energy 1  Change in appetite 0  Feeling bad or failure about yourself  0  Trouble concentrating 0  Moving slowly or fidgety/restless 0  Suicidal thoughts 0  Difficult doing work/chores Not difficult at all  Some recent data might be hidden    Quality of Life:   Personal Goals: Goals established at orientation with interventions provided to work toward goal.  Personal Goals and Risk Factors at Admission - 08/24/19 1608      Core Components/Risk Factors/Patient Goals on Admission   Improve shortness of breath with ADL's  Yes    Intervention  Provide education, individualized exercise plan and daily activity instruction to help decrease symptoms of SOB with activities of daily living.    Expected Outcomes  Short Term: Improve cardiorespiratory fitness to achieve a reduction of symptoms when performing ADLs;Long Term: Be able to perform more ADLs without symptoms or delay the onset of symptoms        Personal Goals Discharge: Goals and Risk Factor Review    Row Name 08/24/19 1609 09/06/19 1314 11/08/19 1410         Core Components/Risk Factors/Patient Goals Review   Personal Goals Review  Develop more efficient breathing techniques such as purse lipped breathing and diaphragmatic breathing and practicing self-pacing with activity.;Improve shortness of breath with ADL's  Improve shortness of breath with ADL's;Increase knowledge of respiratory medications and ability to use respiratory devices properly.;Develop more efficient breathing techniques such as purse lipped  breathing and diaphragmatic breathing and practicing self-pacing with activity.;Heart Failure  Develop more efficient breathing techniques such as purse lipped breathing and diaphragmatic breathing and practicing self-pacing with activity.;Improve shortness of breath with ADL's;Increase knowledge of respiratory medications and ability to use respiratory devices properly.;Heart Failure     Review  --  Patient just started pulmonary rehab, has attended 1 exercise session, heart failure was stable at this time, will continue to evaluate, too early to have achieved program goals.  Department closure x 7 weeks, will reopen 04/14/2020, will work with Patrici Ranks to continue working on admission goals.     Expected Outcomes  --  See admission goals.  See admission goals.        Exercise Goals and Review: Exercise Goals    Row Name 08/24/19 1613 09/06/19 1012 11/09/19 0911         Exercise Goals   Increase Physical Activity  Yes  Yes  Yes     Intervention  Provide advice, education, support and counseling about physical activity/exercise needs.;Develop an individualized exercise prescription for aerobic and resistive training based on initial evaluation findings, risk stratification, comorbidities and participant's personal goals.  Provide advice, education, support and counseling about physical activity/exercise needs.;Develop an individualized exercise prescription for aerobic and resistive training based on initial evaluation findings, risk stratification, comorbidities and participant's personal goals.  Provide advice, education, support and counseling about physical activity/exercise needs.;Develop an individualized exercise prescription for aerobic and resistive training based on initial evaluation findings, risk stratification, comorbidities and participant's personal goals.     Expected Outcomes  Short Term: Attend rehab on a regular basis to increase amount of physical activity.;Long Term: Add in home  exercise to make exercise part of routine and to increase amount of physical activity.;Long Term: Exercising regularly at least 3-5 days a week.  Short Term: Attend rehab on a regular basis to increase amount of physical activity.;Long Term: Add in home exercise to make exercise part of routine and to increase amount of physical activity.;Long Term: Exercising regularly at least 3-5 days a week.  Short Term: Attend rehab on a regular basis to increase amount of physical activity.;Long Term: Add in home exercise to make exercise part of routine and to increase amount of physical activity.;Long Term: Exercising regularly at least 3-5 days a week.     Increase Strength and Stamina  Yes  Yes  Yes  Intervention  Provide advice, education, support and counseling about physical activity/exercise needs.;Develop an individualized exercise prescription for aerobic and resistive training based on initial evaluation findings, risk stratification, comorbidities and participant's personal goals.  Provide advice, education, support and counseling about physical activity/exercise needs.;Develop an individualized exercise prescription for aerobic and resistive training based on initial evaluation findings, risk stratification, comorbidities and participant's personal goals.  Provide advice, education, support and counseling about physical activity/exercise needs.;Develop an individualized exercise prescription for aerobic and resistive training based on initial evaluation findings, risk stratification, comorbidities and participant's personal goals.     Expected Outcomes  Short Term: Increase workloads from initial exercise prescription for resistance, speed, and METs.;Short Term: Perform resistance training exercises routinely during rehab and add in resistance training at home;Long Term: Improve cardiorespiratory fitness, muscular endurance and strength as measured by increased METs and functional capacity (6MWT)  Short  Term: Increase workloads from initial exercise prescription for resistance, speed, and METs.;Short Term: Perform resistance training exercises routinely during rehab and add in resistance training at home;Long Term: Improve cardiorespiratory fitness, muscular endurance and strength as measured by increased METs and functional capacity (6MWT)  Short Term: Increase workloads from initial exercise prescription for resistance, speed, and METs.;Short Term: Perform resistance training exercises routinely during rehab and add in resistance training at home;Long Term: Improve cardiorespiratory fitness, muscular endurance and strength as measured by increased METs and functional capacity (6MWT)     Able to understand and use rate of perceived exertion (RPE) scale  Yes  Yes  Yes     Intervention  Provide education and explanation on how to use RPE scale  Provide education and explanation on how to use RPE scale  Provide education and explanation on how to use RPE scale     Expected Outcomes  Short Term: Able to use RPE daily in rehab to express subjective intensity level;Long Term:  Able to use RPE to guide intensity level when exercising independently  Short Term: Able to use RPE daily in rehab to express subjective intensity level;Long Term:  Able to use RPE to guide intensity level when exercising independently  Short Term: Able to use RPE daily in rehab to express subjective intensity level;Long Term:  Able to use RPE to guide intensity level when exercising independently     Able to understand and use Dyspnea scale  Yes  Yes  Yes     Intervention  Provide education and explanation on how to use Dyspnea scale  Provide education and explanation on how to use Dyspnea scale  Provide education and explanation on how to use Dyspnea scale     Expected Outcomes  Short Term: Able to use Dyspnea scale daily in rehab to express subjective sense of shortness of breath during exertion;Long Term: Able to use Dyspnea scale to  guide intensity level when exercising independently  Short Term: Able to use Dyspnea scale daily in rehab to express subjective sense of shortness of breath during exertion;Long Term: Able to use Dyspnea scale to guide intensity level when exercising independently  Short Term: Able to use Dyspnea scale daily in rehab to express subjective sense of shortness of breath during exertion;Long Term: Able to use Dyspnea scale to guide intensity level when exercising independently     Knowledge and understanding of Target Heart Rate Range (THRR)  Yes  Yes  Yes     Expected Outcomes  Short Term: Able to state/look up THRR;Short Term: Able to use daily as guideline for intensity in rehab;Long Term:  Able to use THRR to govern intensity when exercising independently  Short Term: Able to state/look up THRR;Short Term: Able to use daily as guideline for intensity in rehab;Long Term: Able to use THRR to govern intensity when exercising independently  Short Term: Able to state/look up THRR;Short Term: Able to use daily as guideline for intensity in rehab;Long Term: Able to use THRR to govern intensity when exercising independently     Understanding of Exercise Prescription  Yes  Yes  Yes     Intervention  Provide education, explanation, and written materials on patient's individual exercise prescription  Provide education, explanation, and written materials on patient's individual exercise prescription  Provide education, explanation, and written materials on patient's individual exercise prescription     Expected Outcomes  Short Term: Able to explain program exercise prescription;Long Term: Able to explain home exercise prescription to exercise independently  Short Term: Able to explain program exercise prescription;Long Term: Able to explain home exercise prescription to exercise independently  Short Term: Able to explain program exercise prescription;Long Term: Able to explain home exercise prescription to exercise  independently        Exercise Goals Re-Evaluation: Exercise Goals Re-Evaluation    [place] Name 09/06/19 1013 11/09/19 0912           Exercise Goal Re-Evaluation   Exercise Goals Review  Increase Physical Activity;Increase Strength and Stamina;Able to understand and use rate of perceived exertion (RPE) scale;Able to understand and use Dyspnea scale;Knowledge and understanding of Target Heart Rate Range (THRR);Understanding of Exercise Prescription  Increase Physical Activity;Increase Strength and Stamina;Able to understand and use rate of perceived exertion (RPE) scale;Able to understand and use Dyspnea scale;Knowledge and understanding of Target Heart Rate Range (THRR);Understanding of Exercise Prescription      Comments  Pt has completed 1 exercise session. Pt exercised at 1.5 METs on the stepper. Will continue to monitor and progress as able.  It is unknown if pt has exercised since we ceased in person exercise, as she did not wish to be called. Pt will return 2/23. Will monitor and progress as able.      Expected Outcomes  Through exercise at rehab and at home, the patient will decrease shortness of breath with daily activities and feel confident in carrying out an exercise regime at home.  Through exercise at rehab and at home, the patient will decrease shortness of breath with daily activities and feel confident in carrying out an exercise regime at home.         Nutrition & Weight - Outcomes: Pre Biometrics - 08/24/19 1504      Pre Biometrics   Height  5\' 6"  (1.676 m)    Weight  103.5 kg    BMI (Calculated)  36.85    Grip Strength  18.5 kg        Nutrition: Nutrition Therapy & Goals - 09/02/19 1513      Nutrition Therapy   Diet  Mediterranean      Personal Nutrition Goals   Nutrition Goal  Pt to identify and limit food sources of saturated fat, trans fat, refined carbohydrates and sodium    Personal Goal #2  Pt to identify food quantities necessary to achieve weight loss of  6-24 lb at graduation from cardiac rehab.      Intervention Plan   Intervention  Nutrition handout(s) given to patient.;Prescribe, educate and counsel regarding individualized specific dietary modifications aiming towards targeted core components such as weight, hypertension, lipid management, diabetes, heart failure and other  comorbidities.    Expected Outcomes  Short Term Goal: Understand basic principles of dietary content, such as calories, fat, sodium, cholesterol and nutrients.;Short Term Goal: A plan has been developed with personal nutrition goals set during dietitian appointment.;Long Term Goal: Adherence to prescribed nutrition plan.       Nutrition Discharge: Nutrition Assessments - 09/02/19 1522      Rate Your Plate Scores   Pre Score  60       Education Questionnaire Score:   Goals reviewed with patient; copy given to patient.

## 2019-12-09 NOTE — Progress Notes (Signed)
Discharge Progress Report  Patient Details  Name: Kayla Harrison MRN: [id number] Date of Birth: 10/12/1945 Referring Provider:     Pulmonary Rehab Walk Test from 08/24/2019 in [facility]  Referring Provider  Dr. Aundra Dubin       Number of Visits: 11  Reason for Discharge:  Early Exit:  recently 2 falls and will go to physical therapy for balance issues  Smoking History:  Social History   Tobacco Use  Smoking Status Never Smoker  Smokeless Tobacco Never Used    Diagnosis:  Heart failure, diastolic, chronic ([facility])  ADL [facility]: Pulmonary Assessment Scores    Row Name 08/24/19 1548 08/24/19 1603       ADL [facility]   ADL Phase  Entry  Entry    SOB Score total  --  42      CAT Score   CAT Score  --  14      mMRC Score   mMRC Score  4  --       Initial Exercise Prescription: Initial Exercise Prescription - 08/24/19 1600      Date of Initial Exercise RX and Referring Provider   Date  08/24/19    Referring Provider  Dr. Aundra Dubin      NuStep   Level  2    SPM  80    Minutes  15      Arm Ergometer   Level  1    RPM  30    Minutes  15      Prescription Details   Frequency (times per week)  2    Duration  Progress to 30 minutes of continuous aerobic without signs/symptoms of physical distress      Intensity   THRR 40-80% of Max Heartrate  58-115    Ratings of Perceived Exertion  11-15    Perceived Dyspnea  0-4      Progression   Progression  Continue to progress workloads to maintain intensity without signs/symptoms of physical distress.      Resistance Training   Training Prescription  Yes    Weight  orange bands    Reps  10-15       Discharge Exercise Prescription (Final Exercise Prescription Changes): Exercise Prescription Changes - 11/30/19 1400      Response to Exercise   Blood Pressure (Admit)  120/76    Blood Pressure (Exercise)  120/68    Blood Pressure (Exit)  118/84    Heart Rate (Admit)  81 bpm    Heart Rate  (Exercise)  91 bpm    Heart Rate (Exit)  80 bpm    Oxygen Saturation (Admit)  96 %    Oxygen Saturation (Exercise)  96 %    Oxygen Saturation (Exit)  96 %    Rating of Perceived Exertion (Exercise)  14    Perceived Dyspnea (Exercise)  2    Duration  Continue with 30 min of aerobic exercise without signs/symptoms of physical distress.    Intensity  THRR unchanged      Progression   Progression  Continue to progress workloads to maintain intensity without signs/symptoms of physical distress.      Resistance Training   Training Prescription  Yes    Weight  orange bands    Reps  10-15    Time  10 Minutes      Interval Training   Interval Training  No      Recumbant Bike   Level  1.5  Minutes  15    METs  1.9      NuStep   Level  3    SPM  80    Minutes  15    METs  1.8       Functional Capacity: 6 Minute Walk    Row Name 08/24/19 1548         6 Minute Walk   Phase  Initial     Distance  884 feet     Walk Time  6 minutes     # of Rest Breaks  0     MPH  1.67     METS  1.41     RPE  17     Perceived Dyspnea   3     VO2 Peak  4.92     Symptoms  Yes (comment)     Comments  used walking cane     Resting HR  70 bpm     Resting BP  122/76     Resting Oxygen Saturation   100 %     Exercise Oxygen Saturation  during 6 min walk  93 %     Max Ex. HR  99 bpm     Max Ex. BP  146/80     2 Minute Post BP  116/70       Interval HR   1 Minute HR  87     2 Minute HR  99     3 Minute HR  94     4 Minute HR  95     5 Minute HR  94     6 Minute HR  96     2 Minute Post HR  78     Interval Heart Rate?  Yes       Interval Oxygen   Interval Oxygen?  Yes     Baseline Oxygen Saturation %  100 %     1 Minute Oxygen Saturation %  99 %     1 Minute Liters of Oxygen  0 L     2 Minute Oxygen Saturation %  98 %     2 Minute Liters of Oxygen  0 L     3 Minute Oxygen Saturation %  94 %     3 Minute Liters of Oxygen  0 L     4 Minute Oxygen Saturation %  93 %     4 Minute  Liters of Oxygen  0 L     5 Minute Oxygen Saturation %  98 %     5 Minute Liters of Oxygen  0 L     6 Minute Oxygen Saturation %  93 %     6 Minute Liters of Oxygen  0 L     2 Minute Post Oxygen Saturation %  99 %     2 Minute Post Liters of Oxygen  0 L        Psychological, QOL, Others - Outcomes: PHQ 2/9: Depression screen PHQ 2/9 08/24/2019  Decreased Interest 0  Down, Depressed, Hopeless 0  PHQ - 2 Score 0  Altered sleeping 1  Tired, decreased energy 1  Change in appetite 0  Feeling bad or failure about yourself  0  Trouble concentrating 0  Moving slowly or fidgety/restless 0  Suicidal thoughts 0  Difficult doing work/chores Not difficult at all  Some recent data might be hidden    Quality of Life:   Personal Goals: Goals established at  orientation with interventions provided to work toward goal. Personal Goals and Risk Factors at Admission - 08/24/19 1608      Core Components/Risk Factors/Patient Goals on Admission   Improve shortness of breath with ADL's  Yes    Intervention  Provide education, individualized exercise plan and daily activity instruction to help decrease symptoms of SOB with activities of daily living.    Expected Outcomes  Short Term: Improve cardiorespiratory fitness to achieve a reduction of symptoms when performing ADLs;Long Term: Be able to perform more ADLs without symptoms or delay the onset of symptoms        Personal Goals Discharge: Goals and Risk Factor Review    Row Name 08/24/19 1609 09/06/19 1314 11/08/19 1410         Core Components/Risk Factors/Patient Goals Review   Personal Goals Review  Develop more efficient breathing techniques such as purse lipped breathing and diaphragmatic breathing and practicing self-pacing with activity.;Improve shortness of breath with ADL's  Improve shortness of breath with ADL's;Increase knowledge of respiratory medications and ability to use respiratory devices properly.;Develop more efficient  breathing techniques such as purse lipped breathing and diaphragmatic breathing and practicing self-pacing with activity.;Heart Failure  Develop more efficient breathing techniques such as purse lipped breathing and diaphragmatic breathing and practicing self-pacing with activity.;Improve shortness of breath with ADL's;Increase knowledge of respiratory medications and ability to use respiratory devices properly.;Heart Failure     Review  --  Patient just started pulmonary rehab, has attended 1 exercise session, heart failure was stable at this time, will continue to evaluate, too early to have achieved program goals.  Department closure x 7 weeks, will reopen 04/14/2020, will work with Patrici Ranks to continue working on admission goals.     Expected Outcomes  --  See admission goals.  See admission goals.        Exercise Goals and Review: Exercise Goals    Row Name 08/24/19 1613 09/06/19 1012 11/09/19 0911         Exercise Goals   Increase Physical Activity  Yes  Yes  Yes     Intervention  Provide advice, education, support and counseling about physical activity/exercise needs.;Develop an individualized exercise prescription for aerobic and resistive training based on initial evaluation findings, risk stratification, comorbidities and participant's personal goals.  Provide advice, education, support and counseling about physical activity/exercise needs.;Develop an individualized exercise prescription for aerobic and resistive training based on initial evaluation findings, risk stratification, comorbidities and participant's personal goals.  Provide advice, education, support and counseling about physical activity/exercise needs.;Develop an individualized exercise prescription for aerobic and resistive training based on initial evaluation findings, risk stratification, comorbidities and participant's personal goals.     Expected Outcomes  Short Term: Attend rehab on a regular basis to increase amount of  physical activity.;Long Term: Add in home exercise to make exercise part of routine and to increase amount of physical activity.;Long Term: Exercising regularly at least 3-5 days a week.  Short Term: Attend rehab on a regular basis to increase amount of physical activity.;Long Term: Add in home exercise to make exercise part of routine and to increase amount of physical activity.;Long Term: Exercising regularly at least 3-5 days a week.  Short Term: Attend rehab on a regular basis to increase amount of physical activity.;Long Term: Add in home exercise to make exercise part of routine and to increase amount of physical activity.;Long Term: Exercising regularly at least 3-5 days a week.     Increase Strength and Stamina  Yes  Yes  Yes     Intervention  Provide advice, education, support and counseling about physical activity/exercise needs.;Develop an individualized exercise prescription for aerobic and resistive training based on initial evaluation findings, risk stratification, comorbidities and participant's personal goals.  Provide advice, education, support and counseling about physical activity/exercise needs.;Develop an individualized exercise prescription for aerobic and resistive training based on initial evaluation findings, risk stratification, comorbidities and participant's personal goals.  Provide advice, education, support and counseling about physical activity/exercise needs.;Develop an individualized exercise prescription for aerobic and resistive training based on initial evaluation findings, risk stratification, comorbidities and participant's personal goals.     Expected Outcomes  Short Term: Increase workloads from initial exercise prescription for resistance, speed, and METs.;Short Term: Perform resistance training exercises routinely during rehab and add in resistance training at home;Long Term: Improve cardiorespiratory fitness, muscular endurance and strength as measured by increased METs  and functional capacity (6MWT)  Short Term: Increase workloads from initial exercise prescription for resistance, speed, and METs.;Short Term: Perform resistance training exercises routinely during rehab and add in resistance training at home;Long Term: Improve cardiorespiratory fitness, muscular endurance and strength as measured by increased METs and functional capacity (6MWT)  Short Term: Increase workloads from initial exercise prescription for resistance, speed, and METs.;Short Term: Perform resistance training exercises routinely during rehab and add in resistance training at home;Long Term: Improve cardiorespiratory fitness, muscular endurance and strength as measured by increased METs and functional capacity (6MWT)     Able to understand and use rate of perceived exertion (RPE) scale  Yes  Yes  Yes     Intervention  Provide education and explanation on how to use RPE scale  Provide education and explanation on how to use RPE scale  Provide education and explanation on how to use RPE scale     Expected Outcomes  Short Term: Able to use RPE daily in rehab to express subjective intensity level;Long Term:  Able to use RPE to guide intensity level when exercising independently  Short Term: Able to use RPE daily in rehab to express subjective intensity level;Long Term:  Able to use RPE to guide intensity level when exercising independently  Short Term: Able to use RPE daily in rehab to express subjective intensity level;Long Term:  Able to use RPE to guide intensity level when exercising independently     Able to understand and use Dyspnea scale  Yes  Yes  Yes     Intervention  Provide education and explanation on how to use Dyspnea scale  Provide education and explanation on how to use Dyspnea scale  Provide education and explanation on how to use Dyspnea scale     Expected Outcomes  Short Term: Able to use Dyspnea scale daily in rehab to express subjective sense of shortness of breath during exertion;Long  Term: Able to use Dyspnea scale to guide intensity level when exercising independently  Short Term: Able to use Dyspnea scale daily in rehab to express subjective sense of shortness of breath during exertion;Long Term: Able to use Dyspnea scale to guide intensity level when exercising independently  Short Term: Able to use Dyspnea scale daily in rehab to express subjective sense of shortness of breath during exertion;Long Term: Able to use Dyspnea scale to guide intensity level when exercising independently     Knowledge and understanding of Target Heart Rate Range (THRR)  Yes  Yes  Yes     Expected Outcomes  Short Term: Able to state/look up THRR;Short Term: Able to use daily  as guideline for intensity in rehab;Long Term: Able to use THRR to govern intensity when exercising independently  Short Term: Able to state/look up THRR;Short Term: Able to use daily as guideline for intensity in rehab;Long Term: Able to use THRR to govern intensity when exercising independently  Short Term: Able to state/look up THRR;Short Term: Able to use daily as guideline for intensity in rehab;Long Term: Able to use THRR to govern intensity when exercising independently     Understanding of Exercise Prescription  Yes  Yes  Yes     Intervention  Provide education, explanation, and written materials on patient's individual exercise prescription  Provide education, explanation, and written materials on patient's individual exercise prescription  Provide education, explanation, and written materials on patient's individual exercise prescription     Expected Outcomes  Short Term: Able to explain program exercise prescription;Long Term: Able to explain home exercise prescription to exercise independently  Short Term: Able to explain program exercise prescription;Long Term: Able to explain home exercise prescription to exercise independently  Short Term: Able to explain program exercise prescription;Long Term: Able to explain home  exercise prescription to exercise independently        Exercise Goals Re-Evaluation: Exercise Goals Re-Evaluation    [place] Name 09/06/19 1013 11/09/19 0912           Exercise Goal Re-Evaluation   Exercise Goals Review  Increase Physical Activity;Increase Strength and Stamina;Able to understand and use rate of perceived exertion (RPE) scale;Able to understand and use Dyspnea scale;Knowledge and understanding of Target Heart Rate Range (THRR);Understanding of Exercise Prescription  Increase Physical Activity;Increase Strength and Stamina;Able to understand and use rate of perceived exertion (RPE) scale;Able to understand and use Dyspnea scale;Knowledge and understanding of Target Heart Rate Range (THRR);Understanding of Exercise Prescription      Comments  Pt has completed 1 exercise session. Pt exercised at 1.5 METs on the stepper. Will continue to monitor and progress as able.  It is unknown if pt has exercised since we ceased in person exercise, as she did not wish to be called. Pt will return 2/23. Will monitor and progress as able.      Expected Outcomes  Through exercise at rehab and at home, the patient will decrease shortness of breath with daily activities and feel confident in carrying out an exercise regime at home.  Through exercise at rehab and at home, the patient will decrease shortness of breath with daily activities and feel confident in carrying out an exercise regime at home.         Nutrition & Weight - Outcomes: Pre Biometrics - 08/24/19 1504      Pre Biometrics   Height  5\' 6"  (1.676 m)    Weight  103.5 kg    BMI (Calculated)  36.85    Grip Strength  18.5 kg        Nutrition: Nutrition Therapy & Goals - 09/02/19 1513      Nutrition Therapy   Diet  Mediterranean      Personal Nutrition Goals   Nutrition Goal  Pt to identify and limit food sources of saturated fat, trans fat, refined carbohydrates and sodium    Personal Goal #2  Pt to identify food quantities  necessary to achieve weight loss of 6-24 lb at graduation from cardiac rehab.      Intervention Plan   Intervention  Nutrition handout(s) given to patient.;Prescribe, educate and counsel regarding individualized specific dietary modifications aiming towards targeted core components such as weight, hypertension,  lipid management, diabetes, heart failure and other comorbidities.    Expected Outcomes  Short Term Goal: Understand basic principles of dietary content, such as calories, fat, sodium, cholesterol and nutrients.;Short Term Goal: A plan has been developed with personal nutrition goals set during dietitian appointment.;Long Term Goal: Adherence to prescribed nutrition plan.       Nutrition Discharge: Nutrition Assessments - 09/02/19 1522      Rate Your Plate Scores   Pre Score  60       Education Questionnaire Score:   Goals reviewed with patient; copy given to patient.

## 2019-12-10 ENCOUNTER — Encounter: Payer: Self-pay | Admitting: Physical Therapy

## 2019-12-10 ENCOUNTER — Ambulatory Visit (HOSPITAL_BASED_OUTPATIENT_CLINIC_OR_DEPARTMENT_OTHER)
Admission: RE | Admit: 2019-12-10 | Discharge: 2019-12-10 | Disposition: A | Discharge status: Home / Self Care | Payer: Medicare Other | Source: Home / Self Care | Attending: Cardiology | Admitting: Cardiology

## 2019-12-10 ENCOUNTER — Ambulatory Visit (HOSPITAL_COMMUNITY)
Admission: RE | Admit: 2019-12-10 | Discharge: 2019-12-10 | Disposition: A | Discharge status: Home / Self Care | Payer: Medicare Other | Attending: Cardiology | Admitting: Cardiology

## 2019-12-10 ENCOUNTER — Ambulatory Visit (HOSPITAL_COMMUNITY): Payer: Medicare Other | Admitting: Anesthesiology

## 2019-12-10 ENCOUNTER — Encounter (HOSPITAL_COMMUNITY): Payer: Self-pay | Admitting: Cardiology

## 2019-12-10 ENCOUNTER — Encounter (HOSPITAL_COMMUNITY)
Admission: RE | Disposition: A | Discharge status: Home / Self Care | Payer: Self-pay | Source: Home / Self Care | Attending: Cardiology

## 2019-12-10 DIAGNOSIS — E785 Hyperlipidemia, unspecified: Secondary | ICD-10-CM | POA: Diagnosis not present

## 2019-12-10 DIAGNOSIS — G4733 Obstructive sleep apnea (adult) (pediatric): Secondary | ICD-10-CM | POA: Diagnosis not present

## 2019-12-10 DIAGNOSIS — I4892 Unspecified atrial flutter: Secondary | ICD-10-CM | POA: Insufficient documentation

## 2019-12-10 DIAGNOSIS — I480 Paroxysmal atrial fibrillation: Secondary | ICD-10-CM | POA: Diagnosis not present

## 2019-12-10 DIAGNOSIS — I5032 Chronic diastolic (congestive) heart failure: Secondary | ICD-10-CM | POA: Diagnosis not present

## 2019-12-10 DIAGNOSIS — I361 Nonrheumatic tricuspid (valve) insufficiency: Secondary | ICD-10-CM

## 2019-12-10 DIAGNOSIS — Z79890 Hormone replacement therapy (postmenopausal): Secondary | ICD-10-CM | POA: Insufficient documentation

## 2019-12-10 DIAGNOSIS — E039 Hypothyroidism, unspecified: Secondary | ICD-10-CM | POA: Insufficient documentation

## 2019-12-10 DIAGNOSIS — Z79899 Other long term (current) drug therapy: Secondary | ICD-10-CM | POA: Insufficient documentation

## 2019-12-10 DIAGNOSIS — I509 Heart failure, unspecified: Secondary | ICD-10-CM | POA: Insufficient documentation

## 2019-12-10 DIAGNOSIS — I340 Nonrheumatic mitral (valve) insufficiency: Secondary | ICD-10-CM

## 2019-12-10 DIAGNOSIS — I4819 Other persistent atrial fibrillation: Secondary | ICD-10-CM | POA: Diagnosis not present

## 2019-12-10 DIAGNOSIS — I110 Hypertensive heart disease with heart failure: Secondary | ICD-10-CM | POA: Diagnosis not present

## 2019-12-10 DIAGNOSIS — Z7901 Long term (current) use of anticoagulants: Secondary | ICD-10-CM | POA: Insufficient documentation

## 2019-12-10 DIAGNOSIS — R55 Syncope and collapse: Secondary | ICD-10-CM | POA: Diagnosis not present

## 2019-12-10 HISTORY — PX: TEE WITHOUT CARDIOVERSION: SHX5443

## 2019-12-10 HISTORY — PX: CARDIOVERSION: SHX1299

## 2019-12-10 SURGERY — ECHOCARDIOGRAM, TRANSESOPHAGEAL
Anesthesia: General

## 2019-12-10 MED ORDER — PROPOFOL 500 MG/50ML IV EMUL
INTRAVENOUS | Status: DC | PRN
  Administered 2019-12-10: 80 ug/kg/min via INTRAVENOUS

## 2019-12-10 MED ORDER — SODIUM CHLORIDE 0.9 % IV SOLN: INTRAVENOUS | Status: DC

## 2019-12-10 MED ORDER — LIDOCAINE 2% (20 MG/ML) 5 ML SYRINGE
INTRAMUSCULAR | Status: DC | PRN
  Administered 2019-12-10: 40 mg via INTRAVENOUS

## 2019-12-10 MED ORDER — PROPOFOL 10 MG/ML IV BOLUS
INTRAVENOUS | Status: DC | PRN
  Administered 2019-12-10 (×4): 20 mg via INTRAVENOUS

## 2019-12-10 NOTE — Transfer of Care (Signed)
Immediate Anesthesia Transfer of Care Note  Patient: Kayla Harrison  Procedure(s) Performed: TRANSESOPHAGEAL ECHOCARDIOGRAM (TEE) (N/A ) CARDIOVERSION (N/A )  Patient Location: PACU  Anesthesia Type:General  Level of Consciousness: drowsy and responds to stimulation  Airway & Oxygen Therapy: Patient Spontanous Breathing and Patient connected to nasal cannula oxygen  Post-op Assessment: Report given to RN and Post -op Vital signs reviewed and stable  Post vital signs: Reviewed and stable  Last Vitals:  Vitals Value Taken Time  BP 108/62 12/10/19 0833  Temp    Pulse 70 12/10/19 0833  Resp 23 12/10/19 0833  SpO2 95 % 12/10/19 0833  Vitals shown include unvalidated device data.  Last Pain: There were no vitals filed for this visit.       Complications: No apparent anesthesia complications

## 2019-12-10 NOTE — Interval H&P Note (Signed)
History and Physical Interval Note:  12/10/2019 7:18 AM  Kayla Harrison  has presented today for surgery, with the diagnosis of afib.  The various methods of treatment have been discussed with the patient and family. After consideration of risks, benefits and other options for treatment, the patient has consented to  Procedure(s): TRANSESOPHAGEAL ECHOCARDIOGRAM (TEE) (N/A) CARDIOVERSION (N/A) as a surgical intervention.  The patient's history has been reviewed, patient examined, no change in status, stable for surgery.  I have reviewed the patient's chart and labs.  Questions were answered to the patient's satisfaction.     Nysir Fergusson Harrell Gave

## 2019-12-10 NOTE — Therapy (Signed)
[facility] [address] [place], [place], [address] Phone: [phone]   Fax:  [phone]  Physical Therapy Evaluation  Patient Details  Name: Kayla Harrison MRN: [id number] Date of Birth: Nov 14, 1945 Referring Provider (PT): Wendee Beavers   Encounter Date: 12/08/2019  PT End of Session - 12/10/19 1500    Visit Number  1    Number of Visits  12    Date for PT Re-Evaluation  01/19/20    Authorization Type  Medicare    PT Start Time  0846    PT Stop Time  0928    PT Time Calculation (min)  42 min    Equipment Utilized During Treatment  Gait belt    Behavior During Therapy  [facility] for tasks assessed/performed       Past Medical History:  Diagnosis Date  . Anemia   . Arthritis    osteoarthritis - knees and right shoulder  . Atrial fibrillation [facility])    ablation- 2x's-- 1st time- [facility], 2nd event at [facility] in 2008. Convergent ablation at [facility] 6/14  . Atrial fibrillation ([place])   . Blood transfusion without reported diagnosis   . Breast cancer ([place])    Dr Margot Chimes, total thyroidectomy- 1999- for cancer  . 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 Chronic bilateral pleural effusions   . Colon polyp    Dr Earlean Shawl  . Complete heart block ([place])   . Complication of anesthesia    Ketamine produces LSD reaction, bright colored nightmarish experience   . Dyslipidemia   . Dyspnea   . Endometriosis   . Fibroids   . H/O pleural effusion    s/p thoracentesis w 3271m withdrawn  . Hepatitis    [place] ?hepatitis   . History of dysphagia    due to radiation therapy  . History of hiatal hernia    small noted on PET scan  . History of kidney stones   . Hx of thyroid cancer    Dr SForde Dandy . Hyperlipidemia   . Hypertension   . Hypothyroidism   . Lung cancer, lower lobe ([place] 01/2017   radiation RX completed 03/04/17; will start chemo 6/27, pt unaware of lung cancer  . Morbid obesity ([place]    Status post lap band surgery   . Nephrolithiasis   . Non Hodgkin's lymphoma ([place]    on chemotherapy  . Persistent atrial fibrillation ([place]    a. s/p PVI 2008 b. s/p convergent ablation 25790complicated by bradycardia requiring pacemaker implant  . Personal history of radiation therapy   . Presence of permanent cardiac pacemaker   . Rotator cuff tear    Right  . Sinus node dysfunction ([place]    Complicating convergent ablation 6/14  . St[place] ([facility]    [place]  . SVC syndrome    with lung mass and non hodgkins lymphoma  . Thyroid cancer ([place] 2000    Past Surgical History:  Procedure Laterality Date  . ABDOMINAL HYSTERECTOMY  1983  . afib ablation     a. 2008 PVI b. 2014 convergent ablation  . APPENDECTOMY    . BONE MARROW BIOPSY  02/21/2017  . BREAST LUMPECTOMY Left 2010  . bso  1998  . CARDIAC CATHETERIZATION     2015- negative  . CARDIOVERSION  10/09/2012   Procedure: CARDIOVERSION;  Surgeon: JMinus Breeding MD;  [place]  Service: Cardiovascular;  Laterality: N/A;  . CARDIOVERSION  10/09/2012  Procedure: CARDIOVERSION;  Surgeon: Minus Breeding, MD;  Location: [facility] ENDOSCOPY;  Service: Cardiovascular;  Laterality: N/A;  [name] gave the ok to add pt to the add on , but we must check to find out if the can add pt on at [phone] ( [phone])  . CARDIOVERSION N/A 11/20/2012   Procedure: CARDIOVERSION;  Surgeon: Fay Records, MD;  Location: [facility] ENDOSCOPY;  Service: Cardiovascular;  Laterality: N/A;  . CARDIOVERSION N/A 07/18/2017   Procedure: CARDIOVERSION;  Surgeon: Thayer Headings, MD;  Location: [facility] ENDOSCOPY;  Service: Cardiovascular;  Laterality: N/A;  . CARDIOVERSION N/A 10/03/2017   Procedure: CARDIOVERSION;  Surgeon: Sanda Klein, MD;  Location: [facility] ENDOSCOPY;  Service: Cardiovascular;  Laterality: N/A;  . CARDIOVERSION N/A 01/07/2018   Procedure: CARDIOVERSION;  Surgeon: Acie Fredrickson Wonda Cheng, MD;  Location: [facility];  Service: Cardiovascular;  Laterality: N/A;  . CHOLECYSTECTOMY    . COLONOSCOPY  W/ POLYPECTOMY     Dr Earlean Shawl  . CYSTOSCOPY N/A 02/06/2015   Procedure: CYSTOSCOPY;  Surgeon: Kathie Rhodes, MD;  [facility];  Service: Urology;  Laterality: N/A;  . CYSTOSCOPY W/ RETROGRADES Left 11/17/2017   Procedure: CYSTOSCOPY WITH RETROGRADE /PYELOGRAM/;  Surgeon: Kathie Rhodes, MD;  [facility];  Service: Urology;  Laterality: Left;  . CYSTOSCOPY WITH RETROGRADE PYELOGRAM, URETEROSCOPY AND STENT PLACEMENT Right 02/06/2015   Procedure: RETROGRADE PYELOGRAM, RIGHT URETEROSCOPY STENT PLACEMENT;  Surgeon: Kathie Rhodes, MD;  [facility];  Service: Urology;  Laterality: Right;  . CYSTOSCOPY WITH RETROGRADE PYELOGRAM, URETEROSCOPY AND STENT PLACEMENT Right 03/07/2017   Procedure: CYSTOSCOPY WITH RIGHT RETROGRADE PYELOGRAM,RIGHT  URETEROSCOPYLASER LITHOTRIPSY  AND STENT PLACEMENT AND STONE BASKETRY;  Surgeon: Kathie Rhodes, MD;  Location: [place];  Service: Urology;  Laterality: Right;  . EYE SURGERY     cataract surgery  . fatty mass removal  1999   pubic area  . HOLMIUM LASER APPLICATION N/A 1/95/0932   Procedure: HOLMIUM LASER APPLICATION;  Surgeon: Kathie Rhodes, MD;  [facility];  Service: Urology;  Laterality: N/A;  . HOLMIUM LASER APPLICATION Right 6/71/2458   Procedure: HOLMIUM LASER APPLICATION;  Surgeon: Kathie Rhodes, MD;  Location[facility];  Service: Urology;  Laterality: Right;  . HOLMIUM LASER APPLICATION Left 0/99/8338   Procedure: HOLMIUM LASER APPLICATION;  Surgeon: Kathie Rhodes, MD;  [facility];  Service: Urology;  Laterality: Left;  . IR FLUORO GUIDE PORT INSERTION RIGHT  02/24/2017  . IR NEPHROSTOMY PLACEMENT RIGHT  11/17/2017  . IR PATIENT EVAL TECH 0-60 MINS  03/11/2017  . IR REMOVAL TUN ACCESS W/ PORT W/O FL MOD SED  04/20/2018  . IR US GUIDE VASC ACCESS RIGHT  02/24/2017  . KNEE ARTHROSCOPY     bilateral  . LAPAROSCOPIC GASTRIC BANDING  07/10/2010  . LAPAROSCOPIC GASTRIC BANDING     Laparoscopic adjustable banding  APS System with posterior hiatal hernia, 2 suture.  Marland Kitchen LAPAROTOMY     for ruptured ovary and ovarian artery   . NEPHROLITHOTOMY Right 11/17/2017   Procedure: NEPHROLITHOTOMY PERCUTANEOUS;  Surgeon: Kathie Rhodes, MD;  [facility];  Service: Urology;  Laterality: Right;  . PACEMAKER INSERTION  03/10/2013   MDT dual chamber PPM  . POCKET REVISION N/A 12/08/2013   Procedure: POCKET REVISION;  Surgeon: Deboraha Sprang, MD;  Location: [facility] CATH LAB;  Service: Cardiovascular;  Laterality: N/A;  . PORTA CATH INSERTION    . REVERSE SHOULDER ARTHROPLASTY Right 05/14/2018   Procedure: RIGHT REVERSE SHOULDER ARTHROPLASTY;  Surgeon: Tania Ade, MD;  Location: [facility];  Service: Orthopedics;  Laterality: Right;  . REVERSE SHOULDER REPLACEMENT Right 05/14/2018  . RIGHT HEART CATH N/A 07/21/2019   Procedure: RIGHT HEART CATH;  Surgeon: Larey Dresser, MD;  Location: [place] CV LAB;  Service: Cardiovascular;  Laterality: N/A;  . TEE WITH CARDIOVERSION  09/22/2017  . TEE WITHOUT CARDIOVERSION N/A 10/03/2017   Procedure: TRANSESOPHAGEAL ECHOCARDIOGRAM (TEE);  Surgeon: Sanda Klein, MD;  Location: [facility] ENDOSCOPY;  Service: Cardiovascular;  Laterality: N/A;  . TEE WITHOUT CARDIOVERSION N/A 08/04/2019   Procedure: TRANSESOPHAGEAL ECHOCARDIOGRAM (TEE);  Surgeon: Larey Dresser, MD;  Location: [facility] ENDOSCOPY;  Service: Cardiovascular;  Laterality: N/A;  . THYROIDECTOMY  1998   Dr Margot Chimes  . TONSILLECTOMY    . TOTAL KNEE ARTHROPLASTY  04/13/2012   Procedure: TOTAL KNEE ARTHROPLASTY;  Surgeon: Rudean Haskell, MD;  Location: [facility];  Service: Orthopedics;  Laterality: Right;  Marland Kitchen VIDEO BRONCHOSCOPY WITH ENDOBRONCHIAL ULTRASOUND N/A 02/07/2017   Procedure: VIDEO BRONCHOSCOPY WITH ENDOBRONCHIAL ULTRASOUND;  Surgeon: Marshell Garfinkel, MD;  Location: [facility];  Service: Pulmonary;  Laterality: N/A;    There were no vitals filed for this visit.   Subjective Assessment - 12/10/19 1458    Subjective  1 week ago  (12/01/19), Pt was in shower and passed out. Lives alone. EMS came, but pt did not go to hospital. Next day, 3/11 she was outside talking with neighbor and fell backwards(states lost her balance) and  hit head. Went to [facility], stayed 3 days. Pt states A-Fib, has been fainting in last 6 mo, but not in a few months. Also 3 weeks ago, was cardioverted, but afib has continued . She has been attending Pulmonary rehab. Now d/c'd, due to needing to have PT for balance.  She has had 3 falls from balance, 3 falls from fainting in last year. Has 4 [employer], now using all the time,was using cane.  No stairs at home, 1 level, walk in shower, has neighbor close by that helps her. Sister also lives close, but does not help out. Pt likes to walk outside, paved driveway. States some Trouble bathing, dressing, cleaning due to needing extra time. ALso states pain in coccyx, due to scooting on ground after recent fall, to get help    Pertinent History  A-Fib causing syncope, frequent falls, neruopathy, dizziness, OA, Previous lymphoma    Limitations  Lifting;Standing;Walking;House hold activities    Patient Stated Goals  Improved balance, decreased falls.    Currently in Pain?  Yes    Pain Score  5     Pain Location  Coccyx    Pain Orientation  Posterior    Pain Descriptors / Indicators  Aching;Sore    Pain Type  Acute pain    Pain Onset  1 to 4 weeks ago    Pain Frequency  Constant    Aggravating Factors   sittin, pressure on it    Pain Relieving Factors  pressure relief         [facility] PT Assessment - 12/10/19 0001      Assessment   Medical Diagnosis  Balance/Falls    Referring Provider (PT)  Wendee Beavers    Prior Therapy  in the past      Precautions   Precautions  Fall      Balance Screen   Has the patient fallen in the past 6 months  Yes    How many times?  4    Has the patient had a decrease in activity level  because of a fear of falling?   Yes    Is the patient reluctant to leave their home because of a  fear of falling?   No      Home Environment   Living Environment  Private residence    [place] [address]  Level entry    [place]  One level      Prior Function   Level of Independence  Independent      Cognition   Overall Cognitive Status  Within Functional Limits for tasks assessed      AROM   Overall AROM Comments  UE: mild limitations for shoulder elevation: LE: WFL      Strength   Overall Strength Comments  UE: 4/5,  LE: hips: 4-/5, knee: 4+/5      Palpation   Palpation comment  Sore coccyx (due to recent fall ) no bruising or abrasion      Transfers   Transfers  Sit to Stand    Stand to Sit  7: Independent    Comments  No UE support needed.       Ambulation/Gait   Ambulation Distance (Feet)  75 Feet    Assistive device  4-wheeled walker    Gait Comments  Decreased heel strike and TKE,       Standardized Balance Assessment   Standardized Balance Assessment  Berg Balance Test      Berg Balance Test   Sit to Stand  Able to stand without using hands and stabilize independently    Standing Unsupported  Able to stand safely 2 minutes    Sitting with Back Unsupported but Feet Supported on Floor or Stool  Able to sit safely and securely 2 minutes    Stand to Sit  Sits safely with minimal use of hands    Transfers  Able to transfer safely, minor use of hands    Standing Unsupported with Eyes Closed  Able to stand 10 seconds with supervision    Standing Unsupported with Feet Together  Needs help to attain position and unable to hold for 15 seconds    From Standing, Reach Forward with Outstretched Arm  Can reach forward >12 cm safely (5")    From Standing Position, Pick up Object from Floor  Able to pick up shoe, needs supervision    From Standing Position, Turn to Look Behind Over each Shoulder  Turn sideways only but maintains balance    Turn 360 Degrees  Able to turn 360 degrees safely but slowly    Standing Unsupported, Alternately Place Feet  on Step/Stool  Able to complete >2 steps/needs minimal assist    Standing Unsupported, One Foot in [employer] balance while stepping or standing    Standing on One Leg  Unable to try or needs assist to prevent fall    Total Score  34      Timed Up and Go Test   Normal TUG (seconds)  15.43    TUG Comments  used 4 ww, regular chair, used hands for transfer                Objective measurements completed on examination: See above findings.      [facility] Adult PT Treatment/Exercise - 12/10/19 0001      Knee/Hip Exercises: Seated   Long Arc Quad  10 reps    Marching  10 reps    Sit to [employer]  5 reps;without UE support  PT Education - 12/10/19 1459    Education Details  PT POC, HEP, home safety/balance education, recommended use of 4 WW at all times.    Person(s) Educated  Patient    Methods  Explanation    Comprehension  Verbalized understanding       PT Short Term Goals - 12/10/19 1503      PT SHORT TERM GOAL #1   Title  Pt to be independent with initial HEP for LE strength    Time  2    Period  Weeks    Status  New    Target Date  12/22/19      PT SHORT TERM GOAL #2   Title  Pt with be compliant with safe use of 4 WW at all times, for improved safety    Time  2    Period  Weeks    Status  New    Target Date  12/22/19        PT Long Term Goals - 12/10/19 1504      PT LONG TERM GOAL #1   Title  Pt to be independent with final HEP, for strength and balance    Time  6    Period  Weeks    Status  New    Target Date  01/19/20      PT LONG TERM GOAL #2   Title  Pt to demo improved score on BERG by at least 5 points, to improve balance and safety    Time  6    Period  Weeks    Status  New    Target Date  01/19/20      PT LONG TERM GOAL #3   Title  Pt to demo improved LE strength to at least 4+/5 to improve stability and endurance.    Time  6    Period  Weeks    Status  New    Target Date  01/19/20      PT LONG TERM GOAL #4   Title   Pt to demo ability for ambulation with LRAD, for at least 500 ft, with direction changes and dynamic stability to be [facility] for pt age.    Time  6    Period  Weeks    Status  New    Target Date  01/19/20             Plan - 12/10/19 1509    Clinical Impression Statement  Pt presents with primary complaint of increased falls, dizziness, and weakness. Pt with active a-fib causing some falls, and balance causing some as well. Recommended pt continue use of 4 ww at all times for safety. Pt with poor safety with dynmic balance, reaching , squatting, direction changes, and functional mobility. Pt also with decreased strength of LEs. Pt with stable HR during session today, but has had recent difficulty with A-fib. Pt was sending pulmonary rehab, but was discharged, due to poor balance, and needing PT . Pt lives alone and is a fall risk. Pt with decreased score on balance testing. Pt with no dizziness today, but may require further vestibular testing in future if dizziness returns. Pt to benefit from skilled PT to improve deficits and safety with functional activity. W    Personal Factors and Comorbidities  Comorbidity 1;Comorbidity 2    Comorbidities  A-Fib, dizziness, frequent falls, OA, Neuropathy    Examination-Activity Limitations  Bathing;Dressing;Hygiene/Grooming;Bend;Lift;Squat;Stairs;Locomotion Level    Examination-Participation Restrictions  Laundry;Cleaning;Shop;Community Activity;Meal Prep  Clinical Decision Making  Moderate    Rehab Potential  Good    PT Frequency  2x / week    PT Duration  6 weeks    PT Treatment/Interventions  ADLs/Self Care Home Management;DME Instruction;Gait training;Stair training;Functional mobility training;Therapeutic activities;Therapeutic exercise;Balance training;Neuromuscular re-education;Patient/family education;Orthotic Fit/Training;Scar mobilization;Passive range of motion;Dry needling;Vestibular;Manual techniques;Canalith  Repostioning;Cryotherapy;Electrical Stimulation;Iontophoresis 70m/ml Dexamethasone;Moist Heat    Consulted and Agree with Plan of Care  Patient       Patient will benefit from skilled therapeutic intervention in order to improve the following deficits and impairments:  Abnormal gait, Decreased balance, Decreased mobility, Difficulty walking, Dizziness, Decreased knowledge of precautions, Cardiopulmonary status limiting activity, Decreased activity tolerance, Decreased coordination, Decreased safety awareness, Decreased strength, Pain  Visit Diagnosis: Other abnormalities of gait and mobility     Problem List Patient Active Problem List   Diagnosis Date Noted  . ICH (intracerebral hemorrhage) ([place] 12/02/2019  . A-fib ([place] 12/02/2019  . Anemia 12/02/2019  . QT prolongation 12/02/2019  . Hypothyroidism 12/02/2019  . Gait abnormality 07/10/2018  . S/P reverse total shoulder arthroplasty, 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  . Persistent atrial fibrillation ([place]   . Bilateral ureteral calculi 11/17/2017  . Atrial fibrillation with RVR ([place] 09/15/2017  . Atrial flutter ([place] 07/17/2017  . Port catheter in place 04/02/2017  . Non-Hodgkin lymphoma, unspecified, intrathoracic lymph nodes ([place] 02/13/2017  . Mediastinal mass 02/06/2017  . 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[place] 02/06/2017  . Abnormal chest x-ray   . Lung mass 02/03/2017  . 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  . OSA (obstructive sleep apnea) 02/03/2015  . History of renal calculi 11/16/2014  . Renal calculi 11/16/2014  . Family history of colon cancer 10/26/2014  . Mechanical complication due to cardiac pacemaker pulse generator 11/06/2013  . Dyspnea 05/12/2013  . Hypothyroidism 04/21/2013  . Pleural effusion 04/19/2013  . Long term (current) use of anticoagulants 12/20/2010  . EPIDERMOID CYST 08/22/2010  . Chronic diastolic heart failure ([place] 08/06/2010  . SYNCOPE AND COLLAPSE 07/27/2010  . OSTEOARTHRITIS, KNEE, RIGHT  03/15/2010  . Morbid obesity (HF[place] 12/07/2009  . NONSPEC ELEVATION OF LEVELS OF TRANSAMINASE/LDH 09/08/2009  . BREAST CANCER, HX OF 07/25/2009  . COLONIC POLYPS, HX OF 07/25/2009  . TUBULOVILLOUS ADENOMA, COLON 04/29/2008  . HYPERGLYCEMIA, FASTING 04/29/2008  . HYPERLIPIDEMIA 06/22/2007  . CARCINOMA, THYROID GLAND, HX OF 06/22/2007    LLyndee Hensen PT, DPT 3:29 PM  12/10/19    [place] [address]Phone: [phone]  Fax:  [phone] Name: JRANDEE HUSTONMRN: [id number] of Birth: 904/20/1947

## 2019-12-10 NOTE — Discharge Instructions (Signed)
TEE  YOU HAD AN CARDIAC PROCEDURE TODAY: Refer to the procedure report and other information in the discharge instructions given to you for any specific questions about what was found during the examination. If this information does not answer your questions, please call [facility] office at [phone] to clarify.   DIET: Your first meal following the procedure should be a light meal and then it is ok to progress to your normal diet. A half-sandwich or bowl of soup is an example of a good first meal. Heavy or fried foods are harder to digest and may make you feel nauseous or bloated. Drink plenty of fluids but you should avoid alcoholic beverages for 24 hours. If you had a esophageal dilation, please see attached instructions for diet.   ACTIVITY: Your care partner should take you home directly after the procedure. You should plan to take it easy, moving slowly for the rest of the day. You can resume normal activity the day after the procedure however YOU SHOULD NOT DRIVE, use power tools, machinery or perform tasks that involve climbing or major physical exertion for 24 hours (because of the sedation medicines used during the test).   SYMPTOMS TO REPORT IMMEDIATELY: A cardiologist can be reached at any hour. Please call [phone] for any of the following symptoms:  Vomiting of blood or coffee ground material  New, significant abdominal pain  New, significant chest pain or pain under the shoulder blades  Painful or persistently difficult swallowing  New shortness of breath  Black, tarry-looking or red, bloody stools  FOLLOW UP:  Please also call with any specific questions about appointments or follow up tests.   Electrical Cardioversion Electrical cardioversion is the delivery of a jolt of electricity to restore a normal rhythm to the heart. A rhythm that is too fast or is not regular keeps the heart from pumping well. In this procedure, sticky patches or metal paddles are placed on  the chest to deliver electricity to the heart from a device. This procedure may be done in an emergency if:  There is low or no blood pressure as a result of the heart rhythm.  Normal rhythm must be restored as fast as possible to protect the brain and heart from further damage.  It may save a life. This may also be a scheduled procedure for irregular or fast heart rhythms that are not immediately life-threatening. Tell a health care provider about:  Any allergies you have.  All medicines you are taking, including vitamins, herbs, eye drops, creams, and over-the-counter medicines.  Any problems you or family members have had with anesthetic medicines.  Any blood disorders you have.  Any surgeries you have had.  Any medical conditions you have.  Whether you are pregnant or may be pregnant. What are the risks? Generally, this is a safe procedure. However, problems may occur, including:  Allergic reactions to medicines.  A blood clot that breaks free and travels to other parts of your body.  The possible return of an abnormal heart rhythm within hours or days after the procedure.  Your heart stopping (cardiac arrest). This is rare. What happens before the procedure? Medicines  Your health care provider may have you start taking: ? Blood-thinning medicines (anticoagulants) so your blood does not clot as easily. ? Medicines to help stabilize your heart rate and rhythm.  Ask your health care provider about: ? Changing or stopping your regular medicines. This is especially important if you are taking diabetes medicines or blood  thinners. ? Taking medicines such as aspirin and ibuprofen. These medicines can thin your blood. Do not take these medicines unless your health care provider tells you to take them. ? Taking over-the-counter medicines, vitamins, herbs, and supplements. General instructions  Follow instructions from your health care provider about eating or drinking  restrictions.  Plan to have someone take you home from the hospital or clinic.  If you will be going home right after the procedure, plan to have someone with you for 24 hours.  Ask your health care provider what steps will be taken to help prevent infection. These may include washing your skin with a germ-killing soap. What happens during the procedure?   An IV will be inserted into one of your veins.  Sticky patches (electrodes) or metal paddles may be placed on your chest.  You will be given a medicine to help you relax (sedative).  An electrical shock will be delivered. The procedure may vary among health care providers and hospitals. What can I expect after the procedure?  Your blood pressure, heart rate, breathing rate, and blood oxygen level will be monitored until you leave the hospital or clinic.  Your heart rhythm will be watched to make sure it does not change.  You may have some redness on the skin where the shocks were given. Follow these instructions at home:  Do not drive for 24 hours if you were given a sedative during your procedure.  Take over-the-counter and prescription medicines only as told by your health care provider.  Ask your health care provider how to check your pulse. Check it often.  Rest for 48 hours after the procedure or as told by your health care provider.  Avoid or limit your caffeine use as told by your health care provider.  Keep all follow-up visits as told by your health care provider. This is important. Contact a health care provider if:  You feel like your heart is beating too quickly or your pulse is not regular.  You have a serious muscle cramp that does not go away. Get help right away if:  You have discomfort in your chest.  You are dizzy or you feel faint.  You have trouble breathing or you are short of breath.  Your speech is slurred.  You have trouble moving an arm or leg on one side of your body.  Your fingers or  toes turn cold or blue. Summary  Electrical cardioversion is the delivery of a jolt of electricity to restore a normal rhythm to the heart.  This procedure may be done right away in an emergency or may be a scheduled procedure if the condition is not an emergency.  Generally, this is a safe procedure.  After the procedure, check your pulse often as told by your health care provider. This information is not intended to replace advice given to you by your health care provider. Make sure you discuss any questions you have with your health care provider. Document Revised: 04/12/2019 Document Reviewed: 04/12/2019 Elsevier Patient Education  [place].

## 2019-12-10 NOTE — Anesthesia Preprocedure Evaluation (Addendum)
Anesthesia Evaluation  Patient identified by MRN, date of birth, ID band Patient awake    Reviewed: Allergy & Precautions, NPO status , Patient's Chart, lab work & pertinent test results  History of Anesthesia Complications Negative for: history of anesthetic complications  Airway Mallampati: II  TM Distance: >3 FB Neck ROM: Full  Mouth opening: Limited Mouth Opening  Dental  (+) Dental Advisory Given   Pulmonary shortness of breath, sleep apnea and Continuous Positive Airway Pressure Ventilation ,    breath sounds clear to auscultation       Cardiovascular hypertension, Pt. on medications (-) angina(-) Past MI and (-) CHF + dysrhythmias Atrial Fibrillation + pacemaker  Rhythm:Irregular  TTE 2019: EF 50 to 55%, LV diffuse mild hypokinesis without focal wall motion abnormalities, RV with mildly reduced systolic function, moderate LAE, mild moderate TR, mild PVR, moderately elevated pulmonary artery systolic pressure, pacer wire is visualized in the RA and RV   Neuro/Psych CVA (2003), Residual Symptoms negative psych ROS   GI/Hepatic hiatal hernia,   Endo/Other  Hypothyroidism   Renal/GU Renal disease  negative genitourinary   Musculoskeletal  (+) Arthritis ,   Abdominal   Peds  Hematology negative hematology ROS (+)   Anesthesia Other Findings Day of surgery medications reviewed with patient.  Reproductive/Obstetrics negative OB ROS                            Anesthesia Physical Anesthesia Plan  ASA: II  Anesthesia Plan: General   Post-op Pain Management:    Induction: Intravenous  PONV Risk Score and Plan: 3 and Treatment may vary due to age or medical condition and Propofol infusion  Airway Management Planned: Nasal Cannula  Additional Equipment: None  Intra-op Plan:   Post-operative Plan:   Informed Consent: I have reviewed the patients History and Physical, chart, labs and  discussed the procedure including the risks, benefits and alternatives for the proposed anesthesia with the patient or authorized representative who has indicated his/her understanding and acceptance.     Dental advisory given  Plan Discussed with: CRNA and Surgeon  Anesthesia Plan Comments:         Anesthesia Quick Evaluation

## 2019-12-10 NOTE — CV Procedure (Signed)
   TRANSESOPHAGEAL ECHOCARDIOGRAM GUIDED DIRECT CURRENT CARDIOVERSION  NAME:  Kayla Harrison   MRN: [id number] DOB:  [birth year]   ADMIT DATE: 12/10/2019  INDICATIONS: Symptomatic atrial fibrillation  PROCEDURE:   Informed consent was obtained prior to the procedure. The risks, benefits and alternatives for the procedure were discussed and the patient comprehended these risks.  Risks include, but are not limited to, cough, sore throat, vomiting, nausea, somnolence, esophageal and stomach trauma or perforation, bleeding, low blood pressure, aspiration, pneumonia, infection, trauma to the teeth and death.    After a procedural time-out, the patient was sedated by the anesthesia service. The transesophageal probe was inserted in the esophagus and stomach without difficulty and multiple views were obtained. Anesthesia was monitored under the care of Dr. Ermalene Postin. Patient received 260 mg of propofol and 40 mg of lidocaine.  COMPLICATIONS:    Complications: No complications Patient tolerated procedure well.  FINDINGS:  LEFT VENTRICLE: EF = 55-60%. No regional wall motion abnormalities.  RIGHT VENTRICLE: Normal size and function.   LEFT ATRIUM: No thrombus/mass.  LEFT ATRIAL APPENDAGE: Very small. No thrombus/mass.   RIGHT ATRIUM: No thrombus/mass. Both the right atrial and right ventricular leads are visualized. There is a small amount of fibrin seen on the leads.  AORTIC VALVE:  Trileaflet. Trivial regurgitation. No vegetation.  MITRAL VALVE:    Calcified, mildly degenerative structure. Mild regurgitation. No vegetation.  TRICUSPID VALVE: Normal structure. Mild to moderate regurgitation. No vegetation.  PULMONIC VALVE: Grossly normal structure.  Mild regurgitation. No apparent vegetation.  INTERATRIAL SEPTUM: No PFO or ASD seen by color Doppler.  PERICARDIUM: No effusion noted.  DESCENDING AORTA: Mild diffuse plaque seen   CARDIOVERSION:     Indications:  Symptomatic Atrial  Fibrillation  Procedure Details:  Once the TEE was complete, the patient had the defibrillator pads placed in the anterior and posterior position. Once an appropriate level of sedation was confirmed, the patient was cardioverted x 1 with 120J of biphasic synchronized energy.  The patient converted to atrial paced, ventricular sensed rhythm confirmed via her [employer] device.  There were no apparent complications.  The patient had normal neuro status and respiratory status post procedure with vitals stable as recorded elsewhere.  Adequate airway was maintained throughout and vital signs monitored per protocol.  Buford Dresser, MD, PhD [facility]  [address] [place], [place] [address] [phone]   8:28 AM

## 2019-12-10 NOTE — Anesthesia Procedure Notes (Signed)
Procedure Name: MAC Date/Time: 12/10/2019 8:06 AM Performed by: Janace Litten, CRNA Pre-anesthesia Checklist: Patient identified, Emergency Drugs available, Suction available and Patient being monitored Patient Re-evaluated:Patient Re-evaluated prior to induction Oxygen Delivery Method: Nasal cannula

## 2019-12-10 NOTE — Progress Notes (Signed)
  Echocardiogram Echocardiogram Transesophageal has been performed.  Kayla Harrison 12/10/2019, 8:50 AM

## 2019-12-10 NOTE — Addendum Note (Signed)
Addended by: Lyndee Hensen on: 12/10/2019 03:31 PM   Modules accepted: Orders

## 2019-12-13 ENCOUNTER — Ambulatory Visit (INDEPENDENT_AMBULATORY_CARE_PROVIDER_SITE_OTHER): Payer: Medicare Other | Admitting: Pulmonary Disease

## 2019-12-13 ENCOUNTER — Ambulatory Visit (INDEPENDENT_AMBULATORY_CARE_PROVIDER_SITE_OTHER): Payer: Medicare Other | Admitting: Physical Therapy

## 2019-12-13 ENCOUNTER — Encounter: Payer: Self-pay | Admitting: Physical Therapy

## 2019-12-13 ENCOUNTER — Other Ambulatory Visit: Payer: Self-pay

## 2019-12-13 ENCOUNTER — Encounter: Payer: Self-pay | Admitting: Pulmonary Disease

## 2019-12-13 VITALS — BP 116/64 | HR 74 | Ht 66.0 in | Wt 229.0 lb

## 2019-12-13 DIAGNOSIS — G4733 Obstructive sleep apnea (adult) (pediatric): Secondary | ICD-10-CM | POA: Diagnosis not present

## 2019-12-13 DIAGNOSIS — R2689 Other abnormalities of gait and mobility: Secondary | ICD-10-CM | POA: Diagnosis not present

## 2019-12-13 DIAGNOSIS — R918 Other nonspecific abnormal finding of lung field: Secondary | ICD-10-CM | POA: Diagnosis not present

## 2019-12-13 DIAGNOSIS — I272 Pulmonary hypertension, unspecified: Secondary | ICD-10-CM | POA: Diagnosis not present

## 2019-12-13 NOTE — Progress Notes (Signed)
[facility]  Chief Complaint  Patient presents with  . Follow-up    follow up cpap.  pt states she is doing fair on cpap, notes the mask is creating an indent on the bridge of her nose, waking up several times throughout the night.      Constitutional:  BP 116/64 (BP Location: Left Arm, Cuff Size: Normal)   Pulse 74   Ht '5\' 6"'$  (1.676 m)   Wt 229 lb (103.9 kg)   SpO2 97%   BMI 36.96 kg/m   Past Medical History:  Thyroid cancer, NHL, Sinus node dysfunction s/p ablation and PM, CVA, A fib, Nephrolithiasis, HTN, Hiatal hernia, Brucellosis, Endometriosis, HLD, Colon polyp, OA, Breast cancer  Brief Summary:  Kayla Harrison is a 74 y.o. female with dyspnea, dry cough.  She has hx of non Hodgkin's lymphoma s/p R-CHOP from 04/02/17 to 07/16/17.  She was in hospital after falling at home.  Found to be in a fib.  Had cardioversion x two.    Had CT chest earlier this month.  Reviewed imaging.  Not significant change compared to 08/13/19.  Not as tired in the afternoon since starting CPAP.  Main issue is from mask fit and air feeling too hot.  Respiratory Exam:   Appearance - well kempt   ENMT - no sinus tenderness, no nasal discharge, no oral exudate  Respiratory - normal appearance of chest wall, normal respiratory effort w/o accessory muscle use, no dullness on percussion, no wheezing or rales  CV - s1s2 regular rate and rhythm, no murmurs, no peripheral edema  Ext - lymphedema of legs  Psych - normal mood and affect    Assessment/Plan:   Obstructive sleep apnea. - reviewed her sleep study results - discussed treatment options - reviewed how untreated sleep apnea can impact her health - will proceed with auto CPAP set up  Dyspnea on exertion. - likely from diastolic dysfunction and valvular heart disease with pulmonary hypertension, atrial fibrillation and deconditioning - recent CT chest shows very mild changes of emphysema;  don't think this is contributing to symptoms at present, will continue to monitor clinically  Pulmonary hypertension. - in setting of diastolic heart failure. mitral valve disease, and OSA - f/u with Dr. Caryl Comes and Dr. Aundra Dubin with cardiology - most recent Echo showed improvement in PA pressures  Right lower lung nodular cluster. - stable on most recent imaging studies - regional areas of bronchiectasis likely related to prior XRT - she will have f/u imaging with oncology   Patient Instructions  Try changing back to your original CPAP mask  Look up on-line how to adjust the temperature setting on your CPAP machine  Follow up in 6 months   Time spent 34 minutes   Chesley Mires, MD [place] Pager: [phone] 12/13/2019, 10:33 AM  Flow Sheet     Pulmonary tests:  PFT 01/20/13 >> FEV1 2.97 (118%), FEV1% 86, TLC 5.91 (110%), DLCO 82% PFT 05/12/19 >> FEV1 2.68 (114%), FEV1% 86, TLC 4.55 (85%), DLCO 61%  Chest imaging:  CT angio chest 09/15/17 >> b/l pleural effusions, radiation fibrosis RUL CT chest 528/19 >>  stable 6 mm LLL nodule, patchy nodular consolidation RLL, stable 3 mm RUL nodule, stable Rt paramediastinal post radiation changes. CT chest 05/29/18 >> XRT changes paramediastium, decreased size of nodular cluster RLL, tiny Rt effusion CT chest 04/02/19 >> mild CM, atherosclerosis, s/p thyroidectomy, paramediastinal XRT changes b/l Rt > Lt, nodular cluster RLL decreased  in size, small Rt effusion CT chest 08/13/19 >> stable cluster RLL, decreased Rt effusion CT chest 12/03/19 >> small effusions, fibrotic BTX RUL and RML, mild centrilobular emphysema  Sleep testing:  HST 09/09/19 >> AHI 18.5, SpO2 low 76% Auto CPAP 11/10/19 to 12/09/19 >> used on 27 of 30 nights with average 6 hrs 17 min.  Average AHI 0.7 with median CPAP 8 and 95 th percentile CPAP 10 cm H2O  Cardiac tests:  RHC 07/21/19 >> PA mean 6, RV 57/7, PA 58/23 mean 40, PCWP 23, prominent V  wave, CI 2.51, PVR 3.25 [facility] Echo 12/03/19 >> EF 55 to 60%, grade 2 DD, mild elevation in PASP, mild MR, mild/mod TR  Medications:   Allergies as of 12/13/2019      Reactions   Dofetilide Other (See Comments)   Prolonged QT interval   Rivaroxaban Other (See Comments)   Nose Bleed X 6 hrs ; packing in ER Nasal hemorrhage   Rivaroxaban    Nose Bleed X 6 hrs ; packing in ER Nasal hemorrhage   Tape Other (See Comments)   SKIN IS THIN AND TEARS EASILY   Tikosyn [dofetilide] Other (See Comments)   "Long QT wave"   Epinephrine Other (See Comments)   Oral anesthetic Dental form only (liquid). Patient stated she will become "out of it" she can hear you but cannot respond in normal fashion. "not fully with it"   Epinephrine    Ketamine Other (See Comments)   Hallucinations   Adhesive [tape] Rash   Paper tape as well   Ketamine Other (See Comments)   HALLUCINATIONS      Medication List       Accurate as of December 13, 2019 10:33 AM. If you have any questions, ask your nurse or doctor.        acetaminophen 500 MG tablet Commonly known as: TYLENOL Take 500-1,000 mg by mouth every 6 (six) hours as needed for mild pain or headache.   amiodarone 200 MG tablet Commonly known as: PACERONE Take 2 tablets (400 mg total) by mouth 2 (two) times daily.   calcium-vitamin D 500-200 MG-UNIT tablet Commonly known as: OSCAL WITH D Take 2 tablets by mouth daily with breakfast.   Eliquis 5 MG Tabs tablet Generic drug: apixaban Take 5 mg by mouth 2 (two) times daily.   furosemide 20 MG tablet Commonly known as: LASIX Take 20 mg by mouth daily as needed for fluid or edema.   levothyroxine 137 MCG tablet Commonly known as: SYNTHROID Take 137 mcg by mouth See admin instructions. TAKE 1 TABLET (137 MCG) BY MOUTH DAILY, EXCEPT SATURDAYS   One-A-Day Womens 50+ Advantage Tabs Take 1 tablet by mouth daily with breakfast.   ranolazine 500 MG 12 hr tablet Commonly known as: RANEXA Take 1 tablet  (500 mg total) by mouth 2 (two) times daily.   rosuvastatin 10 MG tablet Commonly known as: CRESTOR Take 10 mg by mouth 2 (two) times a week. Takes on Monday and Thursday   Xiidra 5 % Soln Generic drug: Lifitegrast Place 1 drop into both eyes at bedtime.       Past Surgical History:  She  has a past surgical history that includes Thyroidectomy (1998); bso (1998); Cholecystectomy; Tonsillectomy; Colonoscopy w/ polypectomy; afib ablation; Abdominal hysterectomy (1983); Breast lumpectomy (Left, 2010); Laparoscopic gastric banding (07/10/2010); Appendectomy; Knee arthroscopy; laparotomy; Total knee arthroplasty (04/13/2012); Cardioversion (10/09/2012); Cardioversion (10/09/2012); Cardioversion (N/A, 11/20/2012); Pacemaker insertion (03/10/2013); pocket revision (N/A, 12/08/2013); Eye surgery; Cystoscopy with retrograde  pyelogram, ureteroscopy and stent placement (Right, 02/06/2015); Holmium laser application (N/A, 8/47/3085); Cystoscopy (N/A, 02/06/2015); Video bronchoscopy with endobronchial ultrasound (N/A, 02/07/2017); IR FLUORO GUIDE PORT INSERTION RIGHT (02/24/2017); IR US Guide Vasc Access Right (02/24/2017); Bone marrow biopsy (02/21/2017); Cystoscopy with retrograde pyelogram, ureteroscopy and stent placement (Right, 03/07/2017); Holmium laser application (Right, 6/94/3700); IR PATIENT EVAL TECH 0-60 MINS (03/11/2017); Cardioversion (N/A, 07/18/2017); TEE with cardioversion (09/22/2017); PORTA CATH INSERTION; TEE without cardioversion (N/A, 10/03/2017); Cardioversion (N/A, 10/03/2017); IR NEPHROSTOMY PLACEMENT RIGHT (11/17/2017); Nephrolithotomy (Right, 11/17/2017); Cystoscopy w/ retrogrades (Left, 11/17/2017); Holmium laser application (Left, 02/14/9101); Cardioversion (N/A, 01/07/2018); IR REMOVAL TUN ACCESS W/ PORT W/O FL MOD SED (04/20/2018); Cardiac catheterization; Reverse shoulder arthroplasty (Right, 05/14/2018); fatty mass removal (1999); Laparoscopic gastric banding; REVERSE SHOULDER REPLACEMENT (Right,  05/14/2018); RIGHT HEART CATH (N/A, 07/21/2019); TEE without cardioversion (N/A, 08/04/2019); TEE without cardioversion (N/A, 12/10/2019); and Cardioversion (N/A, 12/10/2019).  Family History:  Her family history includes Colon cancer in her father; Diabetes in her brother, paternal aunt, paternal grandmother, and sister; Heart attack in her father; Heart disease (age of onset: 9) in her father; Other in her mother.  Social History:  She  reports that she has never smoked. She has never used smokeless tobacco. She reports that she does not drink alcohol or use drugs.

## 2019-12-13 NOTE — Patient Instructions (Signed)
Try changing back to your original CPAP mask  Look up on-line how to adjust the temperature setting on your CPAP machine  Follow up in 6 months

## 2019-12-13 NOTE — Therapy (Signed)
[facility] [address] [place], [place], [address] Phone: [phone]   Fax:  [phone]  Physical Therapy Treatment  Patient Details  Name: Kayla Harrison MRN: [id number] Date of Birth: [birth year] Referring Provider (PT): Wendee Beavers   Encounter Date: 12/13/2019  PT End of Session - 12/13/19 1213    Visit Number  2    Number of Visits  12    PT Start Time  0845    PT Stop Time  0929    PT Time Calculation (min)  44 min    Equipment Utilized During Treatment  Gait belt    Activity Tolerance  Patient tolerated treatment well    Behavior During Therapy  [facility] for tasks assessed/performed       Past Medical History:  Diagnosis Date  . Anemia   . Arthritis    osteoarthritis - knees and right shoulder  . Atrial fibrillation [facility])    ablation- 2x's-- 1st time- [facility], 2nd event at [facility] in 2008. Convergent ablation at [facility] 6/14  . Atrial fibrillation ([place])   . Blood transfusion without reported diagnosis   . Breast cancer ([place])    Dr Margot Chimes, total thyroidectomy- 1999- for cancer  . 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 Chronic bilateral pleural effusions   . Colon polyp    Dr Earlean Shawl  . Complete heart block ([place])   . Complication of anesthesia    Ketamine produces LSD reaction, bright colored nightmarish experience   . Dyslipidemia   . Dyspnea   . Endometriosis   . Fibroids   . H/O pleural effusion    s/p thoracentesis w 3281m withdrawn  . Hepatitis    [place]hepatitis   . History of dysphagia    due to radiation therapy  . History of hiatal hernia    small noted on PET scan  . History of kidney stones   . Hx of thyroid cancer    Dr SForde Dandy . Hyperlipidemia   . Hypertension   . Hypothyroidism   . Lung cancer, lower lobe ([place] 01/2017   radiation RX completed 03/04/17; will start chemo 6/27, pt unaware of lung cancer  . Morbid obesity ([place]    Status post lap band surgery  .  Nephrolithiasis   . Non Hodgkin's lymphoma ([place]    on chemotherapy  . Persistent atrial fibrillation ([place]    a. s/p PVI 2008 b. s/p convergent ablation [id number] by bradycardia requiring pacemaker implant  . Personal history of radiation therapy   . Presence of permanent cardiac pacemaker   . Rotator cuff tear    Right  . Sinus node dysfunction ([place]    Complicating convergent ablation 6/14  . St[place]    [place]  . SVC syndrome    with lung mass and non hodgkins lymphoma  . Thyroid cancer ([place] 2000    Past Surgical History:  Procedure Laterality Date  . ABDOMINAL HYSTERECTOMY  1983  . afib ablation     a. 2008 PVI b. 2014 convergent ablation  . APPENDECTOMY    . BONE MARROW BIOPSY  02/21/2017  . BREAST LUMPECTOMY Left 2010  . bso  1998  . CARDIAC CATHETERIZATION     2015- negative  . CARDIOVERSION  10/09/2012   Procedure: CARDIOVERSION;  Surgeon: JMinus Breeding MD;  [facility]  Service: Cardiovascular;  Laterality: N/A;  . CARDIOVERSION  10/09/2012   Procedure: CARDIOVERSION;  Surgeon: JMinus Breeding  MD;  [facility];  Service: Cardiovascular;  Laterality: N/A;  Mike gave the ok to add pt to the add on , but we must check to find out if the can add pt on at 1400 ( [phone])  . CARDIOVERSION N/A 11/20/2012   Procedure: CARDIOVERSION;  Surgeon: Paula V Ross, MD;  [facility];  Service: Cardiovascular;  Laterality: N/A;  . CARDIOVERSION N/A 07/18/2017   Procedure: CARDIOVERSION;  Surgeon: Nahser, Philip J, MD;  [facility];  Service: Cardiovascular;  Laterality: N/A;  . CARDIOVERSION N/A 10/03/2017   Procedure: CARDIOVERSION;  Surgeon: Croitoru, Mihai, MD;  [facility];  Service: Cardiovascular;  Laterality: N/A;  . CARDIOVERSION N/A 01/07/2018   Procedure: CARDIOVERSION;  Surgeon: Nahser, Philip J, MD;  [facility];  Service: Cardiovascular;  Laterality: N/A;  . CARDIOVERSION N/A 12/10/2019    Procedure: CARDIOVERSION;  Surgeon: Christopher, Bridgette, MD;  [facility];  Service: Cardiovascular;  Laterality: N/A;  . CHOLECYSTECTOMY    . COLONOSCOPY W/ POLYPECTOMY     Dr Medoff  . CYSTOSCOPY N/A 02/06/2015   Procedure: CYSTOSCOPY;  Surgeon: Mark Ottelin, MD;  [facility]: [facility];  Service: Urology;  Laterality: N/A;  . CYSTOSCOPY W/ RETROGRADES Left 11/17/2017   Procedure: CYSTOSCOPY WITH RETROGRADE /PYELOGRAM/;  Surgeon: Ottelin, Mark, MD;  [facility];  Service: Urology;  Laterality: Left;  . CYSTOSCOPY WITH RETROGRADE PYELOGRAM, URETEROSCOPY AND STENT PLACEMENT Right 02/06/2015   Procedure: RETROGRADE PYELOGRAM, RIGHT URETEROSCOPY STENT PLACEMENT;  Surgeon: Mark Ottelin, MD;  [facility];  Service: Urology;  Laterality: Right;  . CYSTOSCOPY WITH RETROGRADE PYELOGRAM, URETEROSCOPY AND STENT PLACEMENT Right 03/07/2017   Procedure: CYSTOSCOPY WITH RIGHT RETROGRADE PYELOGRAM,RIGHT  URETEROSCOPYLASER LITHOTRIPSY  AND STENT PLACEMENT AND STONE BASKETRY;  Surgeon: Ottelin, Mark, MD;  Location: [facility];  Service: Urology;  Laterality: Right;  . EYE SURGERY     cataract surgery  . fatty mass removal  1999   pubic area  . HOLMIUM LASER APPLICATION N/A 02/06/2015   Procedure: HOLMIUM LASER APPLICATION;  Surgeon: Mark Ottelin, MD;  [facility];  Service: Urology;  Laterality: N/A;  . HOLMIUM LASER APPLICATION Right 03/07/2017   Procedure: HOLMIUM LASER APPLICATION;  Surgeon: Ottelin, Mark, MD;  Location: [facility];  Service: Urology;  Laterality: Right;  . HOLMIUM LASER APPLICATION Left 11/17/2017   Procedure: HOLMIUM LASER APPLICATION;  Surgeon: Ottelin, Mark, MD;  [facility];  Service: Urology;  Laterality: Left;  . IR FLUORO GUIDE PORT INSERTION RIGHT  02/24/2017  . IR NEPHROSTOMY PLACEMENT RIGHT  11/17/2017  . IR PATIENT EVAL TECH 0-60 MINS  03/11/2017  . IR REMOVAL TUN ACCESS W/ PORT W/O FL MOD SED  04/20/2018  . IR US GUIDE VASC  ACCESS RIGHT  02/24/2017  . KNEE ARTHROSCOPY     bilateral  . LAPAROSCOPIC GASTRIC BANDING  07/10/2010  . LAPAROSCOPIC GASTRIC BANDING     Laparoscopic adjustable banding APS System with posterior hiatal hernia, 2 suture.  . LAPAROTOMY     for ruptured ovary and ovarian artery   . NEPHROLITHOTOMY Right 11/17/2017   Procedure: NEPHROLITHOTOMY PERCUTANEOUS;  Surgeon: Ottelin, Mark, MD;  [facility];  Service: Urology;  Laterality: Right;  . PACEMAKER INSERTION  03/10/2013   MDT dual chamber PPM  . POCKET REVISION N/A 12/08/2013   Procedure: POCKET REVISION;  Surgeon: Steven C Klein, MD;  [facility];  Service: Cardiovascular;  Laterality: N/A;  . PORTA CATH INSERTION    .   REVERSE SHOULDER ARTHROPLASTY Right 05/14/2018   Procedure: RIGHT REVERSE SHOULDER ARTHROPLASTY;  Surgeon: Tania Ade, MD;  Location: [facility];  Service: Orthopedics;  Laterality: Right;  . REVERSE SHOULDER REPLACEMENT Right 05/14/2018  . RIGHT HEART CATH N/A 07/21/2019   Procedure: RIGHT HEART CATH;  Surgeon: Larey Dresser, MD;  Location: [facility] [place] CV LAB;  Service: Cardiovascular;  Laterality: N/A;  . TEE WITH CARDIOVERSION  09/22/2017  . TEE WITHOUT CARDIOVERSION N/A 10/03/2017   Procedure: TRANSESOPHAGEAL ECHOCARDIOGRAM (TEE);  Surgeon: Sanda Klein, MD;  Location: [facility] ENDOSCOPY;  Service: Cardiovascular;  Laterality: N/A;  . TEE WITHOUT CARDIOVERSION N/A 08/04/2019   Procedure: TRANSESOPHAGEAL ECHOCARDIOGRAM (TEE);  Surgeon: Larey Dresser, MD;  Location: [facility] ENDOSCOPY;  Service: Cardiovascular;  Laterality: N/A;  . TEE WITHOUT CARDIOVERSION N/A 12/10/2019   Procedure: TRANSESOPHAGEAL ECHOCARDIOGRAM (TEE);  Surgeon: Buford Dresser, MD;  Location: [facility] ENDOSCOPY;  Service: Cardiovascular;  Laterality: N/A;  . THYROIDECTOMY  1998   Dr Margot Chimes  . TONSILLECTOMY    . TOTAL KNEE ARTHROPLASTY  04/13/2012   Procedure: TOTAL KNEE ARTHROPLASTY;  Surgeon: Rudean Haskell, MD;  Location: [facility];  Service:  Orthopedics;  Laterality: Right;  Marland Kitchen VIDEO BRONCHOSCOPY WITH ENDOBRONCHIAL ULTRASOUND N/A 02/07/2017   Procedure: VIDEO BRONCHOSCOPY WITH ENDOBRONCHIAL ULTRASOUND;  Surgeon: Marshell Garfinkel, MD;  Location: [facility];  Service: Pulmonary;  Laterality: N/A;    There were no vitals filed for this visit.  Subjective Assessment - 12/13/19 1210    Subjective  Pt states she was cardioverted after last session, on 3/19, states she feels good today. HR stable at start of session at 74. Back/tail bone pain feeling a bit better.    Currently in Pain?  Yes    Pain Location  Coccyx    Pain Orientation  Posterior    Pain Descriptors / Indicators  Aching;Sore    Pain Type  Acute pain    Pain Onset  1 to 4 weeks ago    Pain Frequency  Constant                       [facility] Adult PT Treatment/Exercise - 12/13/19 0001      Knee/Hip Exercises: Standing   Hip Flexion  20 reps;Knee bent    Other Standing Knee Exercises  L/R and A/P weight shifts x25 each; Lateral step with weight shift x15 bil; Tandem stance 20 sec x 2 bil;       Knee/Hip Exercises: Seated   Long Arc Quad  20 reps;Both    Marching  20 reps;Both    Sit to Sand  10 reps;without UE support               PT Short Term Goals - 12/10/19 1503      PT SHORT TERM GOAL #1   Title  Pt to be independent with initial HEP for LE strength    Time  2    Period  Weeks    Status  New    Target Date  12/22/19      PT SHORT TERM GOAL #2   Title  Pt with be compliant with safe use of 4 WW at all times, for improved safety    Time  2    Period  Weeks    Status  New    Target Date  12/22/19        PT Long Term Goals - 12/10/19 1504      PT LONG TERM GOAL #1  Title  Pt to be independent with final HEP, for strength and balance    Time  6    Period  Weeks    Status  New    Target Date  01/19/20      PT LONG TERM GOAL #2   Title  Pt to demo improved score on BERG by at least 5 points, to improve balance and safety     Time  6    Period  Weeks    Status  New    Target Date  01/19/20      PT LONG TERM GOAL #3   Title  Pt to demo improved LE strength to at least 4+/5 to improve stability and endurance.    Time  6    Period  Weeks    Status  New    Target Date  01/19/20      PT LONG TERM GOAL #4   Title  Pt to demo ability for ambulation with LRAD, for at least 500 ft, with direction changes and dynamic stability to be [facility] for pt age.    Time  6    Period  Weeks    Status  New    Target Date  01/19/20            Plan - 12/13/19 1214    Clinical Impression Statement  Pt educated on safe LE ther ex for HEP at this time, will continue to progress as able. Started with light balance exercises today as well. HR stable during session today, 72-76. Pt also with foot pain on L, appears to be painful plantars wart, recommended podiatry for this. Will discuss footwear and orthotics at future visit. Pt will benefit from foot wear change and/or orthotic fitting for improved support, and decreased pressure/pain on toes. Pt with pain in R toes 2-5. Plan to progress as tolerated.    Personal Factors and Comorbidities  Comorbidity 1;Comorbidity 2    Comorbidities  A-Fib, dizziness, frequent falls, OA, Neuropathy    Examination-Activity Limitations  Bathing;Dressing;Hygiene/Grooming;Bend;Lift;Squat;Stairs;Locomotion Level    Examination-Participation Restrictions  Laundry;Cleaning;Shop;Community Activity;Meal Prep    Rehab Potential  Good    PT Frequency  2x / week    PT Duration  6 weeks    PT Treatment/Interventions  ADLs/Self Care Home Management;DME Instruction;Gait training;Stair training;Functional mobility training;Therapeutic activities;Therapeutic exercise;Balance training;Neuromuscular re-education;Patient/family education;Orthotic Fit/Training;Scar mobilization;Passive range of motion;Dry needling;Vestibular;Manual techniques;Canalith Repostioning;Cryotherapy;Electrical Stimulation;Iontophoresis '4mg'$ /ml  Dexamethasone;Moist Heat    Consulted and Agree with Plan of Care  Patient       Patient will benefit from skilled therapeutic intervention in order to improve the following deficits and impairments:  Abnormal gait, Decreased balance, Decreased mobility, Difficulty walking, Dizziness, Decreased knowledge of precautions, Cardiopulmonary status limiting activity, Decreased activity tolerance, Decreased coordination, Decreased safety awareness, Decreased strength, Pain  Visit Diagnosis: Other abnormalities of gait and mobility     Problem List Patient Active Problem List   Diagnosis Date Noted  . ICH (intracerebral hemorrhage) ([place]) 12/02/2019  . A-fib ([place]) 12/02/2019  . Anemia 12/02/2019  . QT prolongation 12/02/2019  . Hypothyroidism 12/02/2019  . Gait abnormality 07/10/2018  . S/P reverse total shoulder arthroplasty, 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  . Persistent atrial fibrillation ([place])   . Bilateral ureteral calculi 11/17/2017  . Atrial fibrillation with RVR ([place]) 09/15/2017  . Atrial flutter ([place]) 07/17/2017  . Port catheter in place 04/02/2017  . Non-Hodgkin lymphoma, unspecified, intrathoracic lymph nodes ([place]) 02/13/2017  . Mediastinal mass 02/06/2017  . 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 Abnormal chest x-ray   . Lung mass 02/03/2017  . 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  . OSA (obstructive sleep apnea) 02/03/2015  . History of renal calculi 11/16/2014  . Renal calculi 11/16/2014  . Family history of colon cancer 10/26/2014  . Mechanical complication due to cardiac pacemaker pulse generator 11/06/2013  . Dyspnea 05/12/2013  . Hypothyroidism 04/21/2013  . Pleural effusion 04/19/2013  . Long term (current) use of anticoagulants 12/20/2010  . EPIDERMOID CYST 08/22/2010  . Chronic diastolic heart failure ([place]) 08/06/2010  . SYNCOPE AND COLLAPSE 07/27/2010  . OSTEOARTHRITIS, KNEE, RIGHT 03/15/2010  . Morbid obesity ([place]) 12/07/2009  . NONSPEC ELEVATION OF LEVELS  OF TRANSAMINASE/LDH 09/08/2009  . BREAST CANCER, HX OF 07/25/2009  . COLONIC POLYPS, HX OF 07/25/2009  . TUBULOVILLOUS ADENOMA, COLON 04/29/2008  . HYPERGLYCEMIA, FASTING 04/29/2008  . HYPERLIPIDEMIA 06/22/2007  . CARCINOMA, THYROID GLAND, HX OF 06/22/2007    Lyndee Hensen, PT, DPT 12:20 PM  12/13/19    [facility] [place], [place], [address] Phone: [phone]   Fax:  [phone]  Name: Kayla Harrison MRN: [id number] Date of Birth: Dec 29, 1945

## 2019-12-14 ENCOUNTER — Encounter (HOSPITAL_COMMUNITY): Payer: Medicare Other

## 2019-12-14 DIAGNOSIS — I480 Paroxysmal atrial fibrillation: Secondary | ICD-10-CM | POA: Diagnosis not present

## 2019-12-14 DIAGNOSIS — M5416 Radiculopathy, lumbar region: Secondary | ICD-10-CM | POA: Diagnosis not present

## 2019-12-14 DIAGNOSIS — G4733 Obstructive sleep apnea (adult) (pediatric): Secondary | ICD-10-CM | POA: Diagnosis not present

## 2019-12-14 DIAGNOSIS — E785 Hyperlipidemia, unspecified: Secondary | ICD-10-CM | POA: Diagnosis not present

## 2019-12-14 DIAGNOSIS — R269 Unspecified abnormalities of gait and mobility: Secondary | ICD-10-CM | POA: Diagnosis not present

## 2019-12-14 DIAGNOSIS — C50911 Malignant neoplasm of unspecified site of right female breast: Secondary | ICD-10-CM | POA: Diagnosis not present

## 2019-12-14 DIAGNOSIS — I700 Atherosclerosis of aorta: Secondary | ICD-10-CM | POA: Diagnosis not present

## 2019-12-14 DIAGNOSIS — J984 Other disorders of lung: Secondary | ICD-10-CM | POA: Diagnosis not present

## 2019-12-14 DIAGNOSIS — C884 Extranodal marginal zone B-cell lymphoma of mucosa-associated lymphoid tissue [MALT-lymphoma]: Secondary | ICD-10-CM | POA: Diagnosis not present

## 2019-12-14 DIAGNOSIS — M533 Sacrococcygeal disorders, not elsewhere classified: Secondary | ICD-10-CM | POA: Diagnosis not present

## 2019-12-14 DIAGNOSIS — N1831 Chronic kidney disease, stage 3a: Secondary | ICD-10-CM | POA: Diagnosis not present

## 2019-12-14 DIAGNOSIS — Z87820 Personal history of traumatic brain injury: Secondary | ICD-10-CM | POA: Diagnosis not present

## 2019-12-14 DIAGNOSIS — I2510 Atherosclerotic heart disease of native coronary artery without angina pectoris: Secondary | ICD-10-CM | POA: Diagnosis not present

## 2019-12-14 DIAGNOSIS — L84 Corns and callosities: Secondary | ICD-10-CM | POA: Diagnosis not present

## 2019-12-14 DIAGNOSIS — J432 Centrilobular emphysema: Secondary | ICD-10-CM | POA: Diagnosis not present

## 2019-12-14 NOTE — Anesthesia Postprocedure Evaluation (Signed)
Anesthesia Post Note  Patient: Kayla Harrison  Procedure(s) Performed: TRANSESOPHAGEAL ECHOCARDIOGRAM (TEE) (N/A ) CARDIOVERSION (N/A )     Patient location during evaluation: Endoscopy Anesthesia Type: General Level of consciousness: awake and alert Pain management: pain level controlled Vital Signs Assessment: post-procedure vital signs reviewed and stable Respiratory status: spontaneous breathing, nonlabored ventilation, respiratory function stable and patient connected to nasal cannula oxygen Cardiovascular status: blood pressure returned to baseline and stable Postop Assessment: no apparent nausea or vomiting Anesthetic complications: no    Last Vitals:  Vitals:   12/10/19 0845 12/10/19 0854  BP: (!) 95/58 114/65  Pulse: 69 69  Resp: 15 20  Temp:    SpO2: 99% 99%    Last Pain:  Vitals:   12/13/19 1224  TempSrc:   PainSc: 0-No pain                 Avett Reineck

## 2019-12-15 ENCOUNTER — Ambulatory Visit (INDEPENDENT_AMBULATORY_CARE_PROVIDER_SITE_OTHER): Payer: Medicare Other | Admitting: Physical Therapy

## 2019-12-15 ENCOUNTER — Other Ambulatory Visit: Payer: Self-pay

## 2019-12-15 ENCOUNTER — Encounter: Payer: Self-pay | Admitting: Physical Therapy

## 2019-12-15 DIAGNOSIS — R2689 Other abnormalities of gait and mobility: Secondary | ICD-10-CM | POA: Diagnosis not present

## 2019-12-15 IMAGING — DX DG SHOULDER 2+V PORT*R*
1 series · 1 of 1 positions shown · non-contrast
Comparison: None similar

CLINICAL DATA: Postop right shoulder

EXAM:
PORTABLE RIGHT SHOULDER

[shoulder ap]
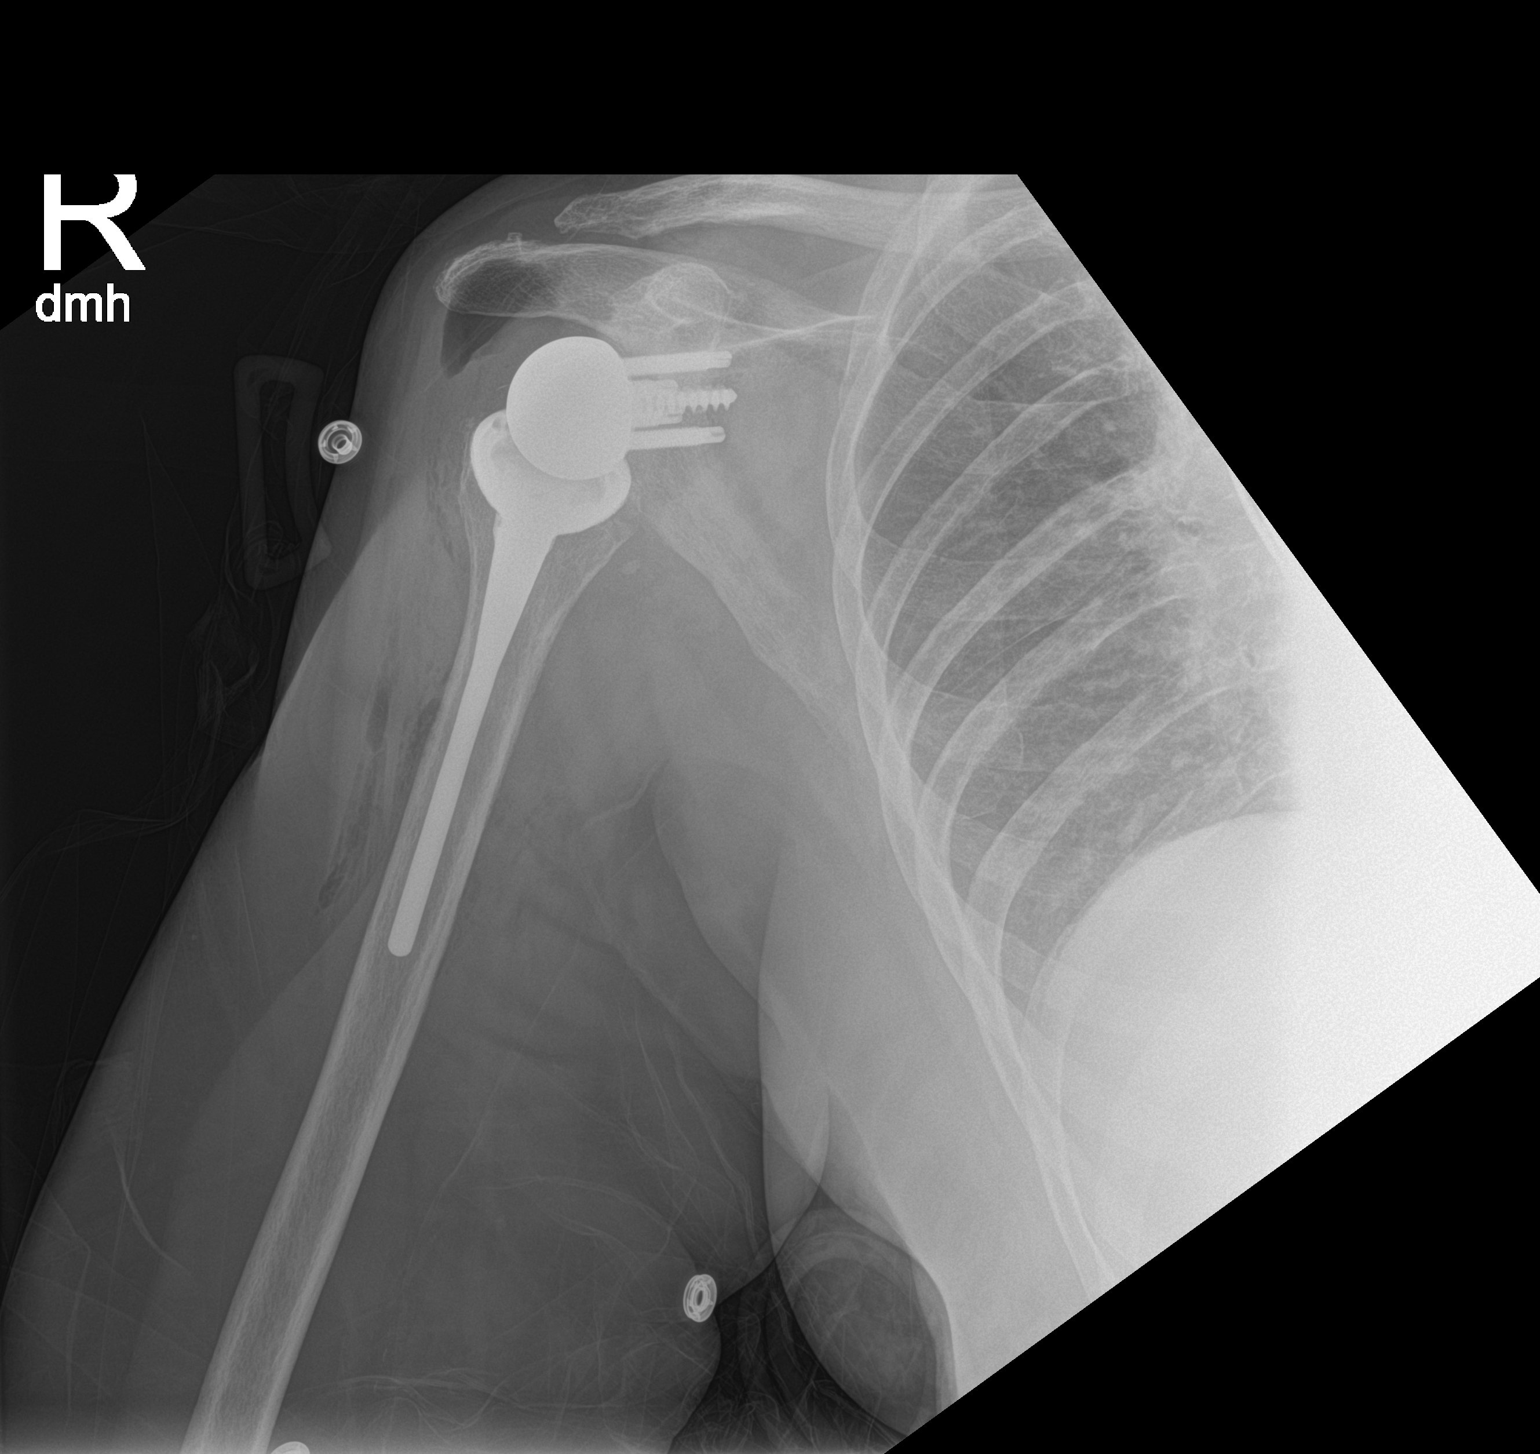

[1 of 1 positions shown; findings below may reference images not displayed]

FINDINGS: Reverse glenohumeral arthroplasty on the right with expected soft
tissue gas. No evidence of periprosthetic fracture. Prosthetic joint
is in typical alignment.

Remote distal clavicle resection.
IMPRESSION: No acute finding after glenohumeral arthroplasty.

## 2019-12-15 NOTE — Therapy (Signed)
[facility] [address] [place], [place], [address] Phone: [phone]   Fax:  [phone]  Physical Therapy Treatment  Patient Details  Name: Kayla Harrison MRN: [id number] Date of Birth: [birth year] Referring Provider (PT): Wendee Beavers   Encounter Date: 12/15/2019  PT End of Session - 12/15/19 0944    Visit Number  3    Number of Visits  12    PT Start Time  0937    PT Stop Time  1017    PT Time Calculation (min)  40 min    Equipment Utilized During Treatment  Gait belt    Activity Tolerance  Patient tolerated treatment well    Behavior During Therapy  [facility] for tasks assessed/performed       Past Medical History:  Diagnosis Date  . Anemia   . Arthritis    osteoarthritis - knees and right shoulder  . Atrial fibrillation [facility])    ablation- 2x's-- 1st time- [facility], 2nd event at [facility] in 2008. Convergent ablation at [facility] 6/14  . Atrial fibrillation ([place])   . Blood transfusion without reported diagnosis   . Breast cancer ([place])    Dr Margot Chimes, total thyroidectomy- 1999- for cancer  . 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 Chronic bilateral pleural effusions   . Colon polyp    Dr Earlean Shawl  . Complete heart block ([place])   . Complication of anesthesia    Ketamine produces LSD reaction, bright colored nightmarish experience   . Dyslipidemia   . Dyspnea   . Endometriosis   . Fibroids   . H/O pleural effusion    s/p thoracentesis w 3250m withdrawn  . Hepatitis    [place]patitis   . History of dysphagia    due to radiation therapy  . History of hiatal hernia    small noted on PET scan  . History of kidney stones   . Hx of thyroid cancer    Dr SForde Dandy . Hyperlipidemia   . Hypertension   . Hypothyroidism   . Lung cancer, lower lobe ([place] 01/2017   radiation RX completed 03/04/17; will start chemo 6/27, pt unaware of lung cancer  . Morbid obesity ([place]    Status post lap band surgery  .  Nephrolithiasis   . Non Hodgkin's lymphoma ([place]    on chemotherapy  . Persistent atrial fibrillation ([place]    a. s/p PVI 2008 b. s/p convergent ablation [id number] by bradycardia requiring pacemaker implant  . Personal history of radiation therapy   . Presence of permanent cardiac pacemaker   . Rotator cuff tear    Right  . Sinus node dysfunction ([place]    Complicating convergent ablation 6/14  . [place]    [place]  . SVC syndrome    with lung mass and non hodgkins lymphoma  . Thy[place] cancer [place]    Past Surgical History:  Procedure Laterality Date  . ABDOMINAL HYSTERECTOMY  1983  . afib ablation     a. 2008 PVI b. 2014 convergent ablation  . APPENDECTOMY    . BONE MARROW BIOPSY  02/21/2017  . BREAST LUMPECTOMY Left 2010  . bso  1998  . CARDIAC CATHETERIZATION     2015- negative  . CARDIOVERSION  10/09/2012   Procedure: CARDIOVERSION;  Surgeon: JMinus Breeding MD;  [facility]  Service: Cardiovascular;  Laterality: N/A;  . CARDIOVERSION  10/09/2012   Procedure: CARDIOVERSION;  Surgeon: JMinus Breeding  MD;  [facility];  Service: Cardiovascular;  Laterality: N/A;  Mike gave the ok to add pt to the add on , but we must check to find out if the can add pt on at 1400 ( [phone])  . CARDIOVERSION N/A 11/20/2012   Procedure: CARDIOVERSION;  Surgeon: Paula V Ross, MD;  [facility];  Service: Cardiovascular;  Laterality: N/A;  . CARDIOVERSION N/A 07/18/2017   Procedure: CARDIOVERSION;  Surgeon: Nahser, Philip J, MD;  [facility];  Service: Cardiovascular;  Laterality: N/A;  . CARDIOVERSION N/A 10/03/2017   Procedure: CARDIOVERSION;  Surgeon: Croitoru, Mihai, MD;  [facility];  Service: Cardiovascular;  Laterality: N/A;  . CARDIOVERSION N/A 01/07/2018   Procedure: CARDIOVERSION;  Surgeon: Nahser, Philip J, MD;  [facility];  Service: Cardiovascular;  Laterality: N/A;  . CARDIOVERSION N/A 12/10/2019    Procedure: CARDIOVERSION;  Surgeon: Christopher, Bridgette, MD;  [facility];  Service: Cardiovascular;  Laterality: N/A;  . CHOLECYSTECTOMY    . COLONOSCOPY W/ POLYPECTOMY     Dr Medoff  . CYSTOSCOPY N/A 02/06/2015   Procedure: CYSTOSCOPY;  Surgeon: Mark Ottelin, MD;  [facility]: [facility];  Service: Urology;  Laterality: N/A;  . CYSTOSCOPY W/ RETROGRADES Left 11/17/2017   Procedure: CYSTOSCOPY WITH RETROGRADE /PYELOGRAM/;  Surgeon: Ottelin, Mark, MD;  [facility];  Service: Urology;  Laterality: Left;  . CYSTOSCOPY WITH RETROGRADE PYELOGRAM, URETEROSCOPY AND STENT PLACEMENT Right 02/06/2015   Procedure: RETROGRADE PYELOGRAM, RIGHT URETEROSCOPY STENT PLACEMENT;  Surgeon: Mark Ottelin, MD;  [facility];  Service: Urology;  Laterality: Right;  . CYSTOSCOPY WITH RETROGRADE PYELOGRAM, URETEROSCOPY AND STENT PLACEMENT Right 03/07/2017   Procedure: CYSTOSCOPY WITH RIGHT RETROGRADE PYELOGRAM,RIGHT  URETEROSCOPYLASER LITHOTRIPSY  AND STENT PLACEMENT AND STONE BASKETRY;  Surgeon: Ottelin, Mark, MD;  Location: [facility];  Service: Urology;  Laterality: Right;  . EYE SURGERY     cataract surgery  . fatty mass removal  1999   pubic area  . HOLMIUM LASER APPLICATION N/A 02/06/2015   Procedure: HOLMIUM LASER APPLICATION;  Surgeon: Mark Ottelin, MD;  [facility];  Service: Urology;  Laterality: N/A;  . HOLMIUM LASER APPLICATION Right 03/07/2017   Procedure: HOLMIUM LASER APPLICATION;  Surgeon: Ottelin, Mark, MD;  Location:  SURGERY CENTER;  Service: Urology;  Laterality: Right;  . HOLMIUM LASER APPLICATION Left 11/17/2017   Procedure: HOLMIUM LASER APPLICATION;  Surgeon: Ottelin, Mark, MD;  [facility];  Service: Urology;  Laterality: Left;  . IR FLUORO GUIDE PORT INSERTION RIGHT  02/24/2017  . IR NEPHROSTOMY PLACEMENT RIGHT  11/17/2017  . IR PATIENT EVAL TECH 0-60 MINS  03/11/2017  . IR REMOVAL TUN ACCESS W/ PORT W/O FL MOD SED  04/20/2018  . IR US GUIDE VASC  ACCESS RIGHT  02/24/2017  . KNEE ARTHROSCOPY     bilateral  . LAPAROSCOPIC GASTRIC BANDING  07/10/2010  . LAPAROSCOPIC GASTRIC BANDING     Laparoscopic adjustable banding APS System with posterior hiatal hernia, 2 suture.  . LAPAROTOMY     for ruptured ovary and ovarian artery   . NEPHROLITHOTOMY Right 11/17/2017   Procedure: NEPHROLITHOTOMY PERCUTANEOUS;  Surgeon: Ottelin, Mark, MD;  [facility];  Service: Urology;  Laterality: Right;  . PACEMAKER INSERTION  03/10/2013   MDT dual chamber PPM  . POCKET REVISION N/A 12/08/2013   Procedure: POCKET REVISION;  Surgeon: Steven C Klein, MD;  [facility];  Service: Cardiovascular;  Laterality: N/A;  . PORTA CATH INSERTION    .   REVERSE SHOULDER ARTHROPLASTY Right 05/14/2018   Procedure: RIGHT REVERSE SHOULDER ARTHROPLASTY;  Surgeon: Tania Ade, MD;  Location: [facility];  Service: Orthopedics;  Laterality: Right;  . REVERSE SHOULDER REPLACEMENT Right 05/14/2018  . RIGHT HEART CATH N/A 07/21/2019   Procedure: RIGHT HEART CATH;  Surgeon: Larey Dresser, MD;  Location: [facility];  Service: Cardiovascular;  Laterality: N/A;  . TEE WITH CARDIOVERSION  09/22/2017  . TEE WITHOUT CARDIOVERSION N/A 10/03/2017   Procedure: TRANSESOPHAGEAL ECHOCARDIOGRAM (TEE);  Surgeon: Sanda Klein, MD;  Location: [facility];  Service: Cardiovascular;  Laterality: N/A;  . TEE WITHOUT CARDIOVERSION N/A 08/04/2019   Procedure: TRANSESOPHAGEAL ECHOCARDIOGRAM (TEE);  Surgeon: Larey Dresser, MD;  Location: [facility];  Service: Cardiovascular;  Laterality: N/A;  . TEE WITHOUT CARDIOVERSION N/A 12/10/2019   Procedure: TRANSESOPHAGEAL ECHOCARDIOGRAM (TEE);  Surgeon: Buford Dresser, MD;  Location: [facility];  Service: Cardiovascular;  Laterality: N/A;  . THYROIDECTOMY  1998   Dr Margot Chimes  . TONSILLECTOMY    . TOTAL KNEE ARTHROPLASTY  04/13/2012   Procedure: TOTAL KNEE ARTHROPLASTY;  Surgeon: Rudean Haskell, MD;  Location: [facility];  Service:  Orthopedics;  Laterality: Right;  Marland Kitchen VIDEO BRONCHOSCOPY WITH ENDOBRONCHIAL ULTRASOUND N/A 02/07/2017   Procedure: VIDEO BRONCHOSCOPY WITH ENDOBRONCHIAL ULTRASOUND;  Surgeon: Marshell Garfinkel, MD;  Location: [employer];  Service: Pulmonary;  Laterality: N/A;    There were no vitals filed for this visit.  Subjective Assessment - 12/15/19 0942    Subjective  Pt with no new complaints. Was sore in quads after last visit. Had L plantars wart removed yesterday, has much less pain.    Currently in Pain?  Yes    Pain Score  4     Pain Location  Coccyx    Pain Descriptors / Indicators  Sore    Pain Type  Acute pain    Pain Onset  1 to 4 weeks ago    Pain Frequency  Intermittent                       [facility] Adult PT Treatment/Exercise - 12/15/19 0945      Ambulation/Gait   Gait Comments  35 ft x6 with practice for slow speed, decreased step length, and increased step height      Self-Care   Other Self-Care Comments   Practiced safe mechanics for lifting 4WW into trunk outside      Knee/Hip Exercises: Standing   Hip Flexion  --    Forward Step Up  5 reps;Both;Hand Hold: 2    Forward Step Up Limitations  4 and 6 in heights with education on safety    Other Standing Knee Exercises  L/R and A/P weight shifts x25 each;     Other Standing Knee Exercises  Reaching outside of BOS varied heights with L and R UE x10 each side      Knee/Hip Exercises: Seated   Long Arc Quad  20 reps;Both    Marching  20 reps;Both    Sit to Sand  without UE support;5 reps      Knee/Hip Exercises: Supine   Straight Leg Raises Limitations  unable.     Other Supine Knee/Hip Exercises  Supine march x20;     Other Supine Knee/Hip Exercises  CLams BlTB x20;                PT Short Term Goals - 12/10/19 1503      PT SHORT TERM GOAL #1  Title  Pt to be independent with initial HEP for LE strength    Time  2    Period  Weeks    Status  New    Target Date  12/22/19      PT SHORT TERM GOAL #2    Title  Pt with be compliant with safe use of 4 WW at all times, for improved safety    Time  2    Period  Weeks    Status  New    Target Date  12/22/19        PT Long Term Goals - 12/10/19 1504      PT LONG TERM GOAL #1   Title  Pt to be independent with final HEP, for strength and balance    Time  6    Period  Weeks    Status  New    Target Date  01/19/20      PT LONG TERM GOAL #2   Title  Pt to demo improved score on BERG by at least 5 points, to improve balance and safety    Time  6    Period  Weeks    Status  New    Target Date  01/19/20      PT LONG TERM GOAL #3   Title  Pt to demo improved LE strength to at least 4+/5 to improve stability and endurance.    Time  6    Period  Weeks    Status  New    Target Date  01/19/20      PT LONG TERM GOAL #4   Title  Pt to demo ability for ambulation with LRAD, for at least 500 ft, with direction changes and dynamic stability to be [facility] for pt age.    Time  6    Period  Weeks    Status  New    Target Date  01/19/20            Plan - 12/15/19 1127    Clinical Impression Statement  LE ther ex progressed, as wel as standing balance. Pt hesitant and with decreased confidence on stairs, reliant on UEs, will benefit from continued practice . Plan to progess strength and balance as tolerated.    Personal Factors and Comorbidities  Comorbidity 1;Comorbidity 2    Comorbidities  A-Fib, dizziness, frequent falls, OA, Neuropathy    Examination-Activity Limitations  Bathing;Dressing;Hygiene/Grooming;Bend;Lift;Squat;Stairs;Locomotion Level    Examination-Participation Restrictions  Laundry;Cleaning;Shop;Community Activity;Meal Prep    Rehab Potential  Good    PT Frequency  2x / week    PT Duration  6 weeks    PT Treatment/Interventions  ADLs/Self Care Home Management;DME Instruction;Gait training;Stair training;Functional mobility training;Therapeutic activities;Therapeutic exercise;Balance training;Neuromuscular  re-education;Patient/family education;Orthotic Fit/Training;Scar mobilization;Passive range of motion;Dry needling;Vestibular;Manual techniques;Canalith Repostioning;Cryotherapy;Electrical Stimulation;Iontophoresis 42m/ml Dexamethasone;Moist Heat    Consulted and Agree with Plan of Care  Patient       Patient will benefit from skilled therapeutic intervention in order to improve the following deficits and impairments:  Abnormal gait, Decreased balance, Decreased mobility, Difficulty walking, Dizziness, Decreased knowledge of precautions, Cardiopulmonary status limiting activity, Decreased activity tolerance, Decreased coordination, Decreased safety awareness, Decreased strength, Pain  Visit Diagnosis: Other abnormalities of gait and mobility     Problem List Patient Active Problem List   Diagnosis Date Noted  . ICH (intracerebral hemorrhage) ([place] 12/02/2019  . A-fib ([place] 12/02/2019  . Anemia 12/02/2019  . QT prolongation 12/02/2019  . Hypothyroidism 12/02/2019  . Gait abnormality 07/10/2018  .  S/P reverse total shoulder arthroplasty, 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  . Persistent atrial fibrillation ([place])   . Bilateral ureteral calculi 11/17/2017  . Atrial fibrillation with RVR ([place]) 09/15/2017  . Atrial flutter ([place]) 07/17/2017  . Port catheter in place 04/02/2017  . Non-Hodgkin lymphoma, unspecified, intrathoracic lymph nodes ([place]) 02/13/2017  . Mediastinal mass 02/06/2017  . 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 Abnormal chest x-ray   . Lung mass 02/03/2017  . 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  . OSA (obstructive sleep apnea) 02/03/2015  . History of renal calculi 11/16/2014  . Renal calculi 11/16/2014  . Family history of colon cancer 10/26/2014  . Mechanical complication due to cardiac pacemaker pulse generator 11/06/2013  . Dyspnea 05/12/2013  . Hypothyroidism 04/21/2013  . Pleural effusion 04/19/2013  . Long term (current) use of anticoagulants 12/20/2010  .  EPIDERMOID CYST 08/22/2010  . Chronic diastolic heart failure ([place]) 08/06/2010  . SYNCOPE AND COLLAPSE 07/27/2010  . OSTEOARTHRITIS, KNEE, RIGHT 03/15/2010  . Morbid obesity ([place]) 12/07/2009  . NONSPEC ELEVATION OF LEVELS OF TRANSAMINASE/LDH 09/08/2009  . BREAST CANCER, HX OF 07/25/2009  . COLONIC POLYPS, HX OF 07/25/2009  . TUBULOVILLOUS ADENOMA, COLON 04/29/2008  . HYPERGLYCEMIA, FASTING 04/29/2008  . HYPERLIPIDEMIA 06/22/2007  . CARCINOMA, THYROID GLAND, HX OF 06/22/2007    Lyndee Hensen, PT, DPT 11:29 AM  12/15/19    [facility] [place], [place], [address] Phone: [phone]   Fax:  [phone]  Name: Kayla Harrison MRN: [id number] Date of Birth: 1946-01-07

## 2019-12-16 ENCOUNTER — Encounter (HOSPITAL_COMMUNITY): Payer: Medicare Other

## 2019-12-20 ENCOUNTER — Ambulatory Visit (INDEPENDENT_AMBULATORY_CARE_PROVIDER_SITE_OTHER): Payer: Medicare Other | Admitting: Physical Therapy

## 2019-12-20 ENCOUNTER — Encounter: Payer: Self-pay | Admitting: Physical Therapy

## 2019-12-20 ENCOUNTER — Other Ambulatory Visit: Payer: Self-pay

## 2019-12-20 ENCOUNTER — Other Ambulatory Visit: Payer: Self-pay | Admitting: Endocrinology

## 2019-12-20 DIAGNOSIS — R2689 Other abnormalities of gait and mobility: Secondary | ICD-10-CM

## 2019-12-20 DIAGNOSIS — M546 Pain in thoracic spine: Secondary | ICD-10-CM

## 2019-12-20 NOTE — Therapy (Signed)
[facility] [address] [place], [place], [address] Phone: [phone]   Fax:  [phone]  Physical Therapy Treatment  Patient Details  Name: Kayla Harrison MRN: [id number] Date of Birth: 05-11-1946 Referring Provider (PT): Wendee Beavers   Encounter Date: 12/20/2019  PT End of Session - 12/20/19 1218    Visit Number  4    Number of Visits  12    PT Start Time  1016    PT Stop Time  1058    PT Time Calculation (min)  42 min    Equipment Utilized During Treatment  Gait belt    Activity Tolerance  Patient tolerated treatment well    Behavior During Therapy  [facility] for tasks assessed/performed       Past Medical History:  Diagnosis Date  . Anemia   . Arthritis    osteoarthritis - knees and right shoulder  . Atrial fibrillation [facility])    ablation- 2x's-- 1st time- [facility], 2nd event at [facility] in 2008. Convergent ablation at [facility] 6/14  . Atrial fibrillation ([place])   . Blood transfusion without reported diagnosis   . Breast cancer ([place])    Dr Margot Chimes, total thyroidectomy- 1999- for cancer  . 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 Chronic bilateral pleural effusions   . Colon polyp    Dr Earlean Shawl  . Complete heart block ([place])   . Complication of anesthesia    Ketamine produces LSD reaction, bright colored nightmarish experience   . Dyslipidemia   . Dyspnea   . Endometriosis   . Fibroids   . H/O pleural effusion    s/p thoracentesis w 3254m withdrawn  . Hepatitis    [place] ?hepatitis   . History of dysphagia    due to radiation therapy  . History of hiatal hernia    small noted on PET scan  . History of kidney stones   . Hx of thyroid cancer    Dr SForde Dandy . Hyperlipidemia   . Hypertension   . Hypothyroidism   . Lung cancer, lower lobe ([place] 01/2017   radiation RX completed 03/04/17; will start chemo 6/27, pt unaware of lung cancer  . Morbid obesity ([place]    Status post lap band surgery  .  Nephrolithiasis   . Non Hodgkin's lymphoma ([place]    on chemotherapy  . Persistent atrial fibrillation ([place]    a. s/p PVI 2008 b. s/p convergent ablation [id number] by bradycardia requiring pacemaker implant  . Personal history of radiation therapy   . Presence of permanent cardiac pacemaker   . Rotator cuff tear    Right  . Sinus node dysfunction ([place]    Complicating convergent ablation 6/14  . St[place] ([facility]    [place]  . SVC syndrome    with lung mass and non hodgkins lymphoma  . Thyroid cancer ([place] 2000    Past Surgical History:  Procedure Laterality Date  . ABDOMINAL HYSTERECTOMY  1983  . afib ablation     a. 2008 PVI b. 2014 convergent ablation  . APPENDECTOMY    . BONE MARROW BIOPSY  02/21/2017  . BREAST LUMPECTOMY Left 2010  . bso  1998  . CARDIAC CATHETERIZATION     2015- negative  . CARDIOVERSION  10/09/2012   Procedure: CARDIOVERSION;  Surgeon: JMinus Breeding MD;  [place]  Service: Cardiovascular;  Laterality: N/A;  . CARDIOVERSION  10/09/2012   Procedure: CARDIOVERSION;  Surgeon: JMinus Breeding  MD;  [facility];  Service: Cardiovascular;  Laterality: N/A;  Mike gave the ok to add pt to the add on , but we must check to find out if the can add pt on at 1400 ( [phone])  . CARDIOVERSION N/A 11/20/2012   Procedure: CARDIOVERSION;  Surgeon: Paula V Ross, MD;  [facility];  Service: Cardiovascular;  Laterality: N/A;  . CARDIOVERSION N/A 07/18/2017   Procedure: CARDIOVERSION;  Surgeon: Nahser, Philip J, MD;  [facility];  Service: Cardiovascular;  Laterality: N/A;  . CARDIOVERSION N/A 10/03/2017   Procedure: CARDIOVERSION;  Surgeon: Croitoru, Mihai, MD;  [facility];  Service: Cardiovascular;  Laterality: N/A;  . CARDIOVERSION N/A 01/07/2018   Procedure: CARDIOVERSION;  Surgeon: Nahser, Philip J, MD;  [facility];  Service: Cardiovascular;  Laterality: N/A;  . CARDIOVERSION N/A 12/10/2019    Procedure: CARDIOVERSION;  Surgeon: Christopher, Bridgette, MD;  [facility];  Service: Cardiovascular;  Laterality: N/A;  . CHOLECYSTECTOMY    . COLONOSCOPY W/ POLYPECTOMY     Dr Medoff  . CYSTOSCOPY N/A 02/06/2015   Procedure: CYSTOSCOPY;  Surgeon: Mark Ottelin, MD;  [facility];  Service: Urology;  Laterality: N/A;  . CYSTOSCOPY W/ RETROGRADES Left 11/17/2017   Procedure: CYSTOSCOPY WITH RETROGRADE /PYELOGRAM/;  Surgeon: Ottelin, Mark, MD;  [facility];  Service: Urology;  Laterality: Left;  . CYSTOSCOPY WITH RETROGRADE PYELOGRAM, URETEROSCOPY AND STENT PLACEMENT Right 02/06/2015   Procedure: RETROGRADE PYELOGRAM, RIGHT URETEROSCOPY STENT PLACEMENT;  Surgeon: Mark Ottelin, MD;  [facility];  Service: Urology;  Laterality: Right;  . CYSTOSCOPY WITH RETROGRADE PYELOGRAM, URETEROSCOPY AND STENT PLACEMENT Right 03/07/2017   Procedure: CYSTOSCOPY WITH RIGHT RETROGRADE PYELOGRAM,RIGHT  URETEROSCOPYLASER LITHOTRIPSY  AND STENT PLACEMENT AND STONE BASKETRY;  Surgeon: Ottelin, Mark, MD;  Location: [facility];  Service: Urology;  Laterality: Right;  . EYE SURGERY     cataract surgery  . fatty mass removal  1999   pubic area  . HOLMIUM LASER APPLICATION N/A 02/06/2015   Procedure: HOLMIUM LASER APPLICATION;  Surgeon: Mark Ottelin, MD;  [facility];  Service: Urology;  Laterality: N/A;  . HOLMIUM LASER APPLICATION Right 03/07/2017   Procedure: HOLMIUM LASER APPLICATION;  Surgeon: Ottelin, Mark, MD;  Location: [facility];  Service: Urology;  Laterality: Right;  . HOLMIUM LASER APPLICATION Left 11/17/2017   Procedure: HOLMIUM LASER APPLICATION;  Surgeon: Ottelin, Mark, MD;  [facility];  Service: Urology;  Laterality: Left;  . IR FLUORO GUIDE PORT INSERTION RIGHT  02/24/2017  . IR NEPHROSTOMY PLACEMENT RIGHT  11/17/2017  . IR PATIENT EVAL TECH 0-60 MINS  03/11/2017  . IR REMOVAL TUN ACCESS W/ PORT W/O FL MOD SED  04/20/2018  . IR US GUIDE VASC  ACCESS RIGHT  02/24/2017  . KNEE ARTHROSCOPY     bilateral  . LAPAROSCOPIC GASTRIC BANDING  07/10/2010  . LAPAROSCOPIC GASTRIC BANDING     Laparoscopic adjustable banding APS System with posterior hiatal hernia, 2 suture.  . LAPAROTOMY     for ruptured ovary and ovarian artery   . NEPHROLITHOTOMY Right 11/17/2017   Procedure: NEPHROLITHOTOMY PERCUTANEOUS;  Surgeon: Ottelin, Mark, MD;  [facility];  Service: Urology;  Laterality: Right;  . PACEMAKER INSERTION  03/10/2013   MDT dual chamber PPM  . POCKET REVISION N/A 12/08/2013   Procedure: POCKET REVISION;  Surgeon: Steven C Klein, MD;  [facility];  Service: Cardiovascular;  Laterality: N/A;  . PORTA CATH INSERTION    .   REVERSE SHOULDER ARTHROPLASTY Right 05/14/2018   Procedure: RIGHT REVERSE SHOULDER ARTHROPLASTY;  Surgeon: Tania Ade, MD;  Location: [place];  Service: Orthopedics;  Laterality: Right;  . REVERSE SHOULDER REPLACEMENT Right 05/14/2018  . RIGHT HEART CATH N/A 07/21/2019   Procedure: RIGHT HEART CATH;  Surgeon: Larey Dresser, MD;  Location: [place] [facility];  Service: Cardiovascular;  Laterality: N/A;  . TEE WITH CARDIOVERSION  09/22/2017  . TEE WITHOUT CARDIOVERSION N/A 10/03/2017   Procedure: TRANSESOPHAGEAL ECHOCARDIOGRAM (TEE);  Surgeon: Sanda Klein, MD;  Location: [facility] ENDOSCOPY;  Service: Cardiovascular;  Laterality: N/A;  . TEE WITHOUT CARDIOVERSION N/A 08/04/2019   Procedure: TRANSESOPHAGEAL ECHOCARDIOGRAM (TEE);  Surgeon: Larey Dresser, MD;  Location: [facility] ENDOSCOPY;  Service: Cardiovascular;  Laterality: N/A;  . TEE WITHOUT CARDIOVERSION N/A 12/10/2019   Procedure: TRANSESOPHAGEAL ECHOCARDIOGRAM (TEE);  Surgeon: Buford Dresser, MD;  Location: [facility] ENDOSCOPY;  Service: Cardiovascular;  Laterality: N/A;  . THYROIDECTOMY  1998   Dr Margot Chimes  . TONSILLECTOMY    . TOTAL KNEE ARTHROPLASTY  04/13/2012   Procedure: TOTAL KNEE ARTHROPLASTY;  Surgeon: Rudean Haskell, MD;  Location: [place];  Service:  Orthopedics;  Laterality: Right;  Marland Kitchen VIDEO BRONCHOSCOPY WITH ENDOBRONCHIAL ULTRASOUND N/A 02/07/2017   Procedure: VIDEO BRONCHOSCOPY WITH ENDOBRONCHIAL ULTRASOUND;  Surgeon: Marshell Garfinkel, MD;  Location: [place];  Service: Pulmonary;  Laterality: N/A;    There were no vitals filed for this visit.  Subjective Assessment - 12/20/19 1218    Subjective  Pt with no new complaints. Pain in tail bone improving.    Currently in Pain?  Yes    Pain Score  2     Pain Location  Coccyx    Pain Descriptors / Indicators  Sore    Pain Type  Acute pain    Pain Onset  1 to 4 weeks ago    Pain Frequency  Intermittent                       [facility] Adult PT Treatment/Exercise - 12/20/19 1023      Ambulation/Gait   Gait Comments  35 ft x8       Self-Care   Other Self-Care Comments   --      Knee/Hip Exercises: Standing   Hip Flexion  20 reps;Knee bent    Forward Lunges Limitations  Fwd stepping, weight shifts, x15 bil;     Hip Abduction  2 sets;10 reps    Forward Step Up  --    Forward Step Up Limitations  --    Other Standing Knee Exercises  L/R and A/P weight shifts x25 each;     Other Standing Knee Exercises  --      Knee/Hip Exercises: Seated   Long Arc Quad  20 reps;Both    Sit to Sand  without UE support;2 sets;10 reps      Knee/Hip Exercises: Supine   Straight Leg Raises Limitations  2x 5 bil;     Other Supine Knee/Hip Exercises  Supine march x20;     Other Supine Knee/Hip Exercises  --               PT Short Term Goals - 12/10/19 1503      PT SHORT TERM GOAL #1   Title  Pt to be independent with initial HEP for LE strength    Time  2    Period  Weeks    Status  New    Target Date  12/22/19  PT SHORT TERM GOAL #2   Title  Pt with be compliant with safe use of 4 WW at all times, for improved safety    Time  2    Period  Weeks    Status  New    Target Date  12/22/19        PT Long Term Goals - 12/10/19 1504      PT LONG TERM GOAL #1   Title   Pt to be independent with final HEP, for strength and balance    Time  6    Period  Weeks    Status  New    Target Date  01/19/20      PT LONG TERM GOAL #2   Title  Pt to demo improved score on BERG by at least 5 points, to improve balance and safety    Time  6    Period  Weeks    Status  New    Target Date  01/19/20      PT LONG TERM GOAL #3   Title  Pt to demo improved LE strength to at least 4+/5 to improve stability and endurance.    Time  6    Period  Weeks    Status  New    Target Date  01/19/20      PT LONG TERM GOAL #4   Title  Pt to demo ability for ambulation with LRAD, for at least 500 ft, with direction changes and dynamic stability to be [facility] for pt age.    Time  6    Period  Weeks    Status  New    Target Date  01/19/20            Plan - 12/20/19 1219    Clinical Impression Statement  Pt with weakness noted in R >L hip today, difficulty to perform standing hip abd. Pt with improving safety awareness with use of walker, and speed of movment, less cuing required. Pt states difficulty at home changing cat litter box, discused options for this, including having cleaning person or other hired person do it, for safety of pt, may be too difficulty for pt to perform at this time.    Personal Factors and Comorbidities  Comorbidity 1;Comorbidity 2    Comorbidities  A-Fib, dizziness, frequent falls, OA, Neuropathy    Examination-Activity Limitations  Bathing;Dressing;Hygiene/Grooming;Bend;Lift;Squat;Stairs;Locomotion Level    Examination-Participation Restrictions  Laundry;Cleaning;Shop;Community Activity;Meal Prep    Rehab Potential  Good    PT Frequency  2x / week    PT Duration  6 weeks    PT Treatment/Interventions  ADLs/Self Care Home Management;DME Instruction;Gait training;Stair training;Functional mobility training;Therapeutic activities;Therapeutic exercise;Balance training;Neuromuscular re-education;Patient/family education;Orthotic Fit/Training;Scar  mobilization;Passive range of motion;Dry needling;Vestibular;Manual techniques;Canalith Repostioning;Cryotherapy;Electrical Stimulation;Iontophoresis '4mg'$ /ml Dexamethasone;Moist Heat    Consulted and Agree with Plan of Care  Patient       Patient will benefit from skilled therapeutic intervention in order to improve the following deficits and impairments:  Abnormal gait, Decreased balance, Decreased mobility, Difficulty walking, Dizziness, Decreased knowledge of precautions, Cardiopulmonary status limiting activity, Decreased activity tolerance, Decreased coordination, Decreased safety awareness, Decreased strength, Pain  Visit Diagnosis: Other abnormalities of gait and mobility     Problem List Patient Active Problem List   Diagnosis Date Noted  . ICH (intracerebral hemorrhage) ([place]) 12/02/2019  . A-fib ([place]) 12/02/2019  . Anemia 12/02/2019  . QT prolongation 12/02/2019  . Hypothyroidism 12/02/2019  . Gait abnormality 07/10/2018  . S/P reverse total shoulder arthroplasty,  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  . Persistent atrial fibrillation ([place])   . Bilateral ureteral calculi 11/17/2017  . Atrial fibrillation with RVR ([place]) 09/15/2017  . Atrial flutter ([place]) 07/17/2017  . Port catheter in place 04/02/2017  . Non-Hodgkin lymphoma, unspecified, intrathoracic lymph nodes ([place]) 02/13/2017  . Mediastinal mass 02/06/2017  . 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 Abnormal chest x-ray   . Lung mass 02/03/2017  . 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  . OSA (obstructive sleep apnea) 02/03/2015  . History of renal calculi 11/16/2014  . Renal calculi 11/16/2014  . Family history of colon cancer 10/26/2014  . Mechanical complication due to cardiac pacemaker pulse generator 11/06/2013  . Dyspnea 05/12/2013  . Hypothyroidism 04/21/2013  . Pleural effusion 04/19/2013  . Long term (current) use of anticoagulants 12/20/2010  . EPIDERMOID CYST 08/22/2010  . Chronic diastolic heart failure ([place])  08/06/2010  . SYNCOPE AND COLLAPSE 07/27/2010  . OSTEOARTHRITIS, KNEE, RIGHT 03/15/2010  . Morbid obesity ([place]) 12/07/2009  . NONSPEC ELEVATION OF LEVELS OF TRANSAMINASE/LDH 09/08/2009  . BREAST CANCER, HX OF 07/25/2009  . COLONIC POLYPS, HX OF 07/25/2009  . TUBULOVILLOUS ADENOMA, COLON 04/29/2008  . HYPERGLYCEMIA, FASTING 04/29/2008  . HYPERLIPIDEMIA 06/22/2007  . CARCINOMA, THYROID GLAND, HX OF 06/22/2007    Lyndee Hensen, PT, DPT 12:23 PM  12/20/19    [place], [place], [address] Phone: [phone]   Fax:  [phone]  Name: Kayla Harrison MRN: [id number] Date of Birth: Jul 07, 1946

## 2019-12-21 ENCOUNTER — Encounter (HOSPITAL_COMMUNITY): Payer: Medicare Other

## 2019-12-22 ENCOUNTER — Other Ambulatory Visit: Payer: Self-pay

## 2019-12-22 ENCOUNTER — Ambulatory Visit (INDEPENDENT_AMBULATORY_CARE_PROVIDER_SITE_OTHER): Payer: Medicare Other | Admitting: Physical Therapy

## 2019-12-22 DIAGNOSIS — R2689 Other abnormalities of gait and mobility: Secondary | ICD-10-CM | POA: Diagnosis not present

## 2019-12-23 ENCOUNTER — Encounter: Payer: Self-pay | Admitting: Physical Therapy

## 2019-12-23 ENCOUNTER — Encounter (HOSPITAL_COMMUNITY): Payer: Medicare Other

## 2019-12-23 NOTE — Therapy (Signed)
[facility] [address] [place], [place], [address] Phone: [phone]   Fax:  [phone]  Physical Therapy Treatment  Patient Details  Name: Kayla Harrison MRN: [id number] Date of Birth: Sep 29, 1945 Referring Provider (PT): Wendee Beavers   Encounter Date: 12/22/2019  PT End of Session - 12/23/19 1204    Visit Number  5    Number of Visits  12    PT Start Time  1018    PT Stop Time  1100    PT Time Calculation (min)  42 min    Equipment Utilized During Treatment  Gait belt    Activity Tolerance  Patient tolerated treatment well    Behavior During Therapy  [facility] for tasks assessed/performed       Past Medical History:  Diagnosis Date  . Anemia   . Arthritis    osteoarthritis - knees and right shoulder  . Atrial fibrillation [facility])    ablation- 2x's-- 1st time- [facility], 2nd event at [facility] in 2008. Convergent ablation at [facility] 6/14  . Atrial fibrillation ([place])   . Blood transfusion without reported diagnosis   . Breast cancer ([place])    Dr Margot Chimes, total thyroidectomy- 1999- for cancer  . 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 Chronic bilateral pleural effusions   . Colon polyp    Dr Earlean Shawl  . Complete heart block ([place])   . Complication of anesthesia    Ketamine produces LSD reaction, bright colored nightmarish experience   . Dyslipidemia   . Dyspnea   . Endometriosis   . Fibroids   . H/O pleural effusion    s/p thoracentesis w 325m withdrawn  . Hepatitis    [place]hepatitis   . History of dysphagia    due to radiation therapy  . History of hiatal hernia    small noted on PET scan  . History of kidney stones   . Hx of thyroid cancer    Dr SForde Dandy . Hyperlipidemia   . Hypertension   . Hypothyroidism   . Lung cancer, lower lobe ([place] 01/2017   radiation RX completed 03/04/17; will start chemo 6/27, pt unaware of lung cancer  . Morbid obesity ([place]    Status post lap band surgery  .  Nephrolithiasis   . Non Hodgkin's lymphoma ([place]    on chemotherapy  . Persistent atrial fibrillation ([place]    a. s/p PVI 2008 b. s/p convergent ablation 29024complicated by bradycardia requiring pacemaker implant  . Personal history of radiation therapy   . Presence of permanent cardiac pacemaker   . Rotator cuff tear    Right  . Sinus node dysfunction ([place]    Complicating convergent ablation 6/14  . [place]    [place]  . SVC syndrome    with lung mass and non hodgkins lymphoma  . Thyroid cancer ([facility] 2000    Past Surgical History:  Procedure Laterality Date  . ABDOMINAL HYSTERECTOMY  1983  . afib ablation     a. 2008 PVI b. 2014 convergent ablation  . APPENDECTOMY    . BONE MARROW BIOPSY  02/21/2017  . BREAST LUMPECTOMY Left 2010  . bso  1998  . CARDIAC CATHETERIZATION     2015- negative  . CARDIOVERSION  10/09/2012   Procedure: CARDIOVERSION;  Surgeon: JMinus Breeding MD;  [facility]  Service: Cardiovascular;  Laterality: N/A;  . CARDIOVERSION  10/09/2012   Procedure: CARDIOVERSION;  Surgeon: JMinus Breeding  MD;  [facility];  Service: Cardiovascular;  Laterality: N/A;  Mike gave the ok to add pt to the add on , but we must check to find out if the can add pt on at 1400 ( [phone])  . CARDIOVERSION N/A 11/20/2012   Procedure: CARDIOVERSION;  Surgeon: Paula V Ross, MD;  [facility];  Service: Cardiovascular;  Laterality: N/A;  . CARDIOVERSION N/A 07/18/2017   Procedure: CARDIOVERSION;  Surgeon: Nahser, Philip J, MD;  [facility];  Service: Cardiovascular;  Laterality: N/A;  . CARDIOVERSION N/A 10/03/2017   Procedure: CARDIOVERSION;  Surgeon: Croitoru, Mihai, MD;  [facility];  Service: Cardiovascular;  Laterality: N/A;  . CARDIOVERSION N/A 01/07/2018   Procedure: CARDIOVERSION;  Surgeon: Nahser, Philip J, MD;  [facility];  Service: Cardiovascular;  Laterality: N/A;  . CARDIOVERSION N/A 12/10/2019    Procedure: CARDIOVERSION;  Surgeon: Christopher, Bridgette, MD;  [facility];  Service: Cardiovascular;  Laterality: N/A;  . CHOLECYSTECTOMY    . COLONOSCOPY W/ POLYPECTOMY     Dr Medoff  . CYSTOSCOPY N/A 02/06/2015   Procedure: CYSTOSCOPY;  Surgeon: Mark Ottelin, MD;  [facility];  Service: Urology;  Laterality: N/A;  . CYSTOSCOPY W/ RETROGRADES Left 11/17/2017   Procedure: CYSTOSCOPY WITH RETROGRADE /PYELOGRAM/;  Surgeon: Ottelin, Mark, MD;  [facility];  Service: Urology;  Laterality: Left;  . CYSTOSCOPY WITH RETROGRADE PYELOGRAM, URETEROSCOPY AND STENT PLACEMENT Right 02/06/2015   Procedure: RETROGRADE PYELOGRAM, RIGHT URETEROSCOPY STENT PLACEMENT;  Surgeon: Mark Ottelin, MD;  [facility];  Service: Urology;  Laterality: Right;  . CYSTOSCOPY WITH RETROGRADE PYELOGRAM, URETEROSCOPY AND STENT PLACEMENT Right 03/07/2017   Procedure: CYSTOSCOPY WITH RIGHT RETROGRADE PYELOGRAM,RIGHT  URETEROSCOPYLASER LITHOTRIPSY  AND STENT PLACEMENT AND STONE BASKETRY;  Surgeon: Ottelin, Mark, MD;  Location: [facility];  Service: Urology;  Laterality: Right;  . EYE SURGERY     cataract surgery  . fatty mass removal  1999   pubic area  . HOLMIUM LASER APPLICATION N/A 02/06/2015   Procedure: HOLMIUM LASER APPLICATION;  Surgeon: Mark Ottelin, MD;  [facility];  Service: Urology;  Laterality: N/A;  . HOLMIUM LASER APPLICATION Right 03/07/2017   Procedure: HOLMIUM LASER APPLICATION;  Surgeon: Ottelin, Mark, MD;  Location:  SURGERY CENTER;  Service: Urology;  Laterality: Right;  . HOLMIUM LASER APPLICATION Left 11/17/2017   Procedure: HOLMIUM LASER APPLICATION;  Surgeon: Ottelin, Mark, MD;  [facility];  Service: Urology;  Laterality: Left;  . IR FLUORO GUIDE PORT INSERTION RIGHT  02/24/2017  . IR NEPHROSTOMY PLACEMENT RIGHT  11/17/2017  . IR PATIENT EVAL TECH 0-60 MINS  03/11/2017  . IR REMOVAL TUN ACCESS W/ PORT W/O FL MOD SED  04/20/2018  . IR US GUIDE VASC  ACCESS RIGHT  02/24/2017  . KNEE ARTHROSCOPY     bilateral  . LAPAROSCOPIC GASTRIC BANDING  07/10/2010  . LAPAROSCOPIC GASTRIC BANDING     Laparoscopic adjustable banding APS System with posterior hiatal hernia, 2 suture.  . LAPAROTOMY     for ruptured ovary and ovarian artery   . NEPHROLITHOTOMY Right 11/17/2017   Procedure: NEPHROLITHOTOMY PERCUTANEOUS;  Surgeon: Ottelin, Mark, MD;  [facility];  Service: Urology;  Laterality: Right;  . PACEMAKER INSERTION  03/10/2013   MDT dual chamber PPM  . POCKET REVISION N/A 12/08/2013   Procedure: POCKET REVISION;  Surgeon: Steven C Klein, MD;  [facility];  Service: Cardiovascular;  Laterality: N/A;  . PORTA CATH INSERTION    .   REVERSE SHOULDER ARTHROPLASTY Right 05/14/2018   Procedure: RIGHT REVERSE SHOULDER ARTHROPLASTY;  Surgeon: Tania Ade, MD;  Location: [facility];  Service: Orthopedics;  Laterality: Right;  . REVERSE SHOULDER REPLACEMENT Right 05/14/2018  . RIGHT HEART CATH N/A 07/21/2019   Procedure: RIGHT HEART CATH;  Surgeon: Larey Dresser, MD;  Location: [facility];  Service: Cardiovascular;  Laterality: N/A;  . TEE WITH CARDIOVERSION  09/22/2017  . TEE WITHOUT CARDIOVERSION N/A 10/03/2017   Procedure: TRANSESOPHAGEAL ECHOCARDIOGRAM (TEE);  Surgeon: Sanda Klein, MD;  Location[facility];  Service: Cardiovascular;  Laterality: N/A;  . TEE WITHOUT CARDIOVERSION N/A 08/04/2019   Procedure: TRANSESOPHAGEAL ECHOCARDIOGRAM (TEE);  Surgeon: Larey Dresser, MD;  Location: [facility];  Service: Cardiovascular;  Laterality: N/A;  . TEE WITHOUT CARDIOVERSION N/A 12/10/2019   Procedure: TRANSESOPHAGEAL ECHOCARDIOGRAM (TEE);  Surgeon: Buford Dresser, MD;  Location: [facility];  Service: Cardiovascular;  Laterality: N/A;  . THYROIDECTOMY  1998   Dr Margot Chimes  . TONSILLECTOMY    . TOTAL KNEE ARTHROPLASTY  04/13/2012   Procedure: TOTAL KNEE ARTHROPLASTY;  Surgeon: Rudean Haskell, MD;  Location: [place];  Service:  Orthopedics;  Laterality: Right;  Marland Kitchen VIDEO BRONCHOSCOPY WITH ENDOBRONCHIAL ULTRASOUND N/A 02/07/2017   Procedure: VIDEO BRONCHOSCOPY WITH ENDOBRONCHIAL ULTRASOUND;  Surgeon: Marshell Garfinkel, MD;  Location: [facility];  Service: Pulmonary;  Laterality: N/A;    There were no vitals filed for this visit.  Subjective Assessment - 12/23/19 1203    Subjective  Pt reports doing well. Got new life alert. Having difficulty changing cat litter, due to heavy job, feeling unsafe.    Currently in Pain?  No/denies    Pain Score  0-No pain                       [facility] Adult PT Treatment/Exercise - 12/22/19 1026      Ambulation/Gait   Gait Comments  35 ft x8 with RW;       Knee/Hip Exercises: Standing   Heel Raises  Both;20 reps    Hip Flexion  20 reps;Knee bent    Forward Lunges Limitations  --    Hip Abduction  2 sets;10 reps    Forward Step Up  10 reps;Both    Other Standing Knee Exercises  L/R and A/P weight shifts x25 each; Lateral stepping w weight shifts x20 bil;   Toe taps 6 in step x20;     Other Standing Knee Exercises  Mini squats at counter x15;       Knee/Hip Exercises: Seated   Long Arc Quad  20 reps;Both    Sit to Sand  without UE support;2 sets;10 reps      Knee/Hip Exercises: Supine   Straight Leg Raises Limitations  --    Other Supine Knee/Hip Exercises  --               PT Short Term Goals - 12/10/19 1503      PT SHORT TERM GOAL #1   Title  Pt to be independent with initial HEP for LE strength    Time  2    Period  Weeks    Status  New    Target Date  12/22/19      PT SHORT TERM GOAL #2   Title  Pt with be compliant with safe use of 4 WW at all times, for improved safety    Time  2    Period  Weeks    Status  New    Target Date  12/22/19        PT Long Term Goals - 12/10/19 1504      PT LONG TERM GOAL #1   Title  Pt to be independent with final HEP, for strength and balance    Time  6    Period  Weeks    Status  New    Target Date   01/19/20      PT LONG TERM GOAL #2   Title  Pt to demo improved score on BERG by at least 5 points, to improve balance and safety    Time  6    Period  Weeks    Status  New    Target Date  01/19/20      PT LONG TERM GOAL #3   Title  Pt to demo improved LE strength to at least 4+/5 to improve stability and endurance.    Time  6    Period  Weeks    Status  New    Target Date  01/19/20      PT LONG TERM GOAL #4   Title  Pt to demo ability for ambulation with LRAD, for at least 500 ft, with direction changes and dynamic stability to be [facility] for pt age.    Time  6    Period  Weeks    Status  New    Target Date  01/19/20            Plan - 12/23/19 1205    Clinical Impression Statement  Pt challenged with exercises, due to fatigue with standing activity, as well as R LE being weaker. Will benefit from practice with walking and curbs outside when able. No lob with activities today. Recommended continued use of RW.    Personal Factors and Comorbidities  Comorbidity 1;Comorbidity 2    Comorbidities  A-Fib, dizziness, frequent falls, OA, Neuropathy    Examination-Activity Limitations  Bathing;Dressing;Hygiene/Grooming;Bend;Lift;Squat;Stairs;Locomotion Level    Examination-Participation Restrictions  Laundry;Cleaning;Shop;Community Activity;Meal Prep    Rehab Potential  Good    PT Frequency  2x / week    PT Duration  6 weeks    PT Treatment/Interventions  ADLs/Self Care Home Management;DME Instruction;Gait training;Stair training;Functional mobility training;Therapeutic activities;Therapeutic exercise;Balance training;Neuromuscular re-education;Patient/family education;Orthotic Fit/Training;Scar mobilization;Passive range of motion;Dry needling;Vestibular;Manual techniques;Canalith Repostioning;Cryotherapy;Electrical Stimulation;Iontophoresis '4mg'$ /ml Dexamethasone;Moist Heat    Consulted and Agree with Plan of Care  Patient       Patient will benefit from skilled therapeutic  intervention in order to improve the following deficits and impairments:  Abnormal gait, Decreased balance, Decreased mobility, Difficulty walking, Dizziness, Decreased knowledge of precautions, Cardiopulmonary status limiting activity, Decreased activity tolerance, Decreased coordination, Decreased safety awareness, Decreased strength, Pain  Visit Diagnosis: Other abnormalities of gait and mobility     Problem List Patient Active Problem List   Diagnosis Date Noted  . ICH (intracerebral hemorrhage) ([place]) 12/02/2019  . A-fib ([place]) 12/02/2019  . Anemia 12/02/2019  . QT prolongation 12/02/2019  . Hypothyroidism 12/02/2019  . Gait abnormality 07/10/2018  . S/P reverse total shoulder arthroplasty, 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  . Persistent atrial fibrillation ([place])   . Bilateral ureteral calculi 11/17/2017  . Atrial fibrillation with RVR ([place]) 09/15/2017  . Atrial flutter ([place]) 07/17/2017  . Port catheter in place 04/02/2017  . Non-Hodgkin lymphoma, unspecified, intrathoracic lymph nodes ([place]) 02/13/2017  . Mediastinal mass 02/06/2017  . 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 Abnormal chest x-ray   . Lung mass 02/03/2017  . 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 OSA (obstructive sleep apnea) 02/03/2015  . History of renal calculi 11/16/2014  . Renal calculi 11/16/2014  . Family history of colon cancer 10/26/2014  . Mechanical complication due to cardiac pacemaker pulse generator 11/06/2013  . Dyspnea 05/12/2013  . Hypothyroidism 04/21/2013  . Pleural effusion 04/19/2013  . Long term (current) use of anticoagulants 12/20/2010  . EPIDERMOID CYST 08/22/2010  . Chronic diastolic heart failure ([place]) 08/06/2010  . SYNCOPE AND COLLAPSE 07/27/2010  . OSTEOARTHRITIS, KNEE, RIGHT 03/15/2010  . Morbid obesity ([place]) 12/07/2009  . NONSPEC ELEVATION OF LEVELS OF TRANSAMINASE/LDH 09/08/2009  . BREAST CANCER, HX OF 07/25/2009  . COLONIC POLYPS, HX OF 07/25/2009  . TUBULOVILLOUS ADENOMA,  COLON 04/29/2008  . HYPERGLYCEMIA, FASTING 04/29/2008  . HYPERLIPIDEMIA 06/22/2007  . CARCINOMA, THYROID GLAND, HX OF 06/22/2007  Lyndee Hensen, PT, DPT 12:07 PM  12/23/19    [place], [place], [address] Phone: [phone]   Fax:  [phone]  Name: Kayla Harrison MRN: [id number] Date of Birth: 04-06-1946

## 2019-12-27 ENCOUNTER — Other Ambulatory Visit: Payer: Self-pay

## 2019-12-27 ENCOUNTER — Ambulatory Visit (INDEPENDENT_AMBULATORY_CARE_PROVIDER_SITE_OTHER): Payer: Medicare Other | Admitting: Physical Therapy

## 2019-12-27 ENCOUNTER — Encounter: Payer: Self-pay | Admitting: Physical Therapy

## 2019-12-27 DIAGNOSIS — R2689 Other abnormalities of gait and mobility: Secondary | ICD-10-CM | POA: Diagnosis not present

## 2019-12-27 NOTE — Therapy (Signed)
[facility] [address] [place], [place], [address] Phone: [phone]   Fax:  [phone]  Physical Therapy Treatment  Patient Details  Name: Kayla Harrison MRN: [id number] Date of Birth: 09/15/1946 Referring Provider (PT): Taye Gonfa   Encounter Date: 12/27/2019  PT End of Session - 12/27/19 1053    Visit Number  6    Number of Visits  12    PT Start Time  0846    PT Stop Time  0926    PT Time Calculation (min)  40 min    Equipment Utilized During Treatment  Gait belt    Activity Tolerance  Patient tolerated treatment well    Behavior During Therapy  WFL for tasks assessed/performed       Past Medical History:  Diagnosis Date  . Anemia   . Arthritis    osteoarthritis - knees and right shoulder  . Atrial fibrillation (HCC)    ablation- 2x's-- 1st time- [facility], 2nd event at [facility] in 2008. Convergent ablation at [facility] 6/14  . Atrial fibrillation (HCC)   . Blood transfusion without reported diagnosis   . Breast cancer (HCC)    Dr Streck, total thyroidectomy- 1999- for cancer  . 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 Chronic bilateral pleural effusions   . Colon polyp    Dr Medoff  . Complete heart block (HCC)   . Complication of anesthesia    Ketamine produces LSD reaction, bright colored nightmarish experience   . Dyslipidemia   . Dyspnea   . Endometriosis   . Fibroids   . H/O pleural effusion    s/p thoracentesis w 3200ml withdrawn  . Hepatitis    [place] ?hepatitis   . History of dysphagia    due to radiation therapy  . History of hiatal hernia    small noted on PET scan  . History of kidney stones   . Hx of thyroid cancer    Dr South  . Hyperlipidemia   . Hypertension   . Hypothyroidism   . Lung cancer, lower lobe (HCC) 01/2017   radiation RX completed 03/04/17; will start chemo 6/27, pt unaware of lung cancer  . Morbid obesity (HCC)    Status post lap band surgery  .  Nephrolithiasis   . Non Hodgkin's lymphoma (HCC)    on chemotherapy  . Persistent atrial fibrillation (HCC)    a. s/p PVI 2008 b. s/p convergent ablation 2014 complicated by bradycardia requiring pacemaker implant  . Personal history of radiation therapy   . Presence of permanent cardiac pacemaker   . Rotator cuff tear    Right  . Sinus node dysfunction (HCC)    Complicating convergent ablation 6/14  . Stroke (HCC)    2003- [place] x2  . SVC syndrome    with lung mass and non hodgkins lymphoma  . Thyroid cancer (HCC) 2000    Past Surgical History:  Procedure Laterality Date  . ABDOMINAL HYSTERECTOMY  1983  . afib ablation     a. 2008 PVI b. 2014 convergent ablation  . APPENDECTOMY    . BONE MARROW BIOPSY  02/21/2017  . BREAST LUMPECTOMY Left 2010  . bso  1998  . CARDIAC CATHETERIZATION     2015- negative  . CARDIOVERSION  10/09/2012   Procedure: CARDIOVERSION;  Surgeon: James Hochrein, MD;  [facility]: [facility];  Service: Cardiovascular;  Laterality: N/A;  . CARDIOVERSION  10/09/2012   Procedure: CARDIOVERSION;  Surgeon: James Hochrein,   MD;  Location: [place];  Service: Cardiovascular;  Laterality: N/A;  [name] gave the ok to add pt to the add on , but we must check to find out if the can add pt on at [phone] ( [phone])  . CARDIOVERSION N/A 11/20/2012   Procedure: CARDIOVERSION;  Surgeon: Fay Records, MD;  Location: [place];  Service: Cardiovascular;  Laterality: N/A;  . CARDIOVERSION N/A 07/18/2017   Procedure: CARDIOVERSION;  Surgeon: Thayer Headings, MD;  Location: [facility] ENDOSCOPY;  Service: Cardiovascular;  Laterality: N/A;  . CARDIOVERSION N/A 10/03/2017   Procedure: CARDIOVERSION;  Surgeon: Sanda Klein, MD;  Location: [facility]OSCOPY;  Service: Cardiovascular;  Laterality: N/A;  . CARDIOVERSION N/A 01/07/2018   Procedure: CARDIOVERSION;  Surgeon: Thayer Headings, MD;  Location: [facility] ENDOSCOPY;  Service: Cardiovascular;  Laterality: N/A;  . CARDIOVERSION N/A 12/10/2019    Procedure: CARDIOVERSION;  Surgeon: Buford Dresser, MD;  Location: [facility] ENDOSCOPY;  Service: Cardiovascular;  Laterality: N/A;  . CHOLECYSTECTOMY    . COLONOSCOPY W/ POLYPECTOMY     Dr Earlean Shawl  . CYSTOSCOPY N/A 02/06/2015   Procedure: CYSTOSCOPY;  Surgeon: Kathie Rhodes, MD;  Location: [facility];  Service: Urology;  Laterality: N/A;  . CYSTOSCOPY W/ RETROGRADES Left 11/17/2017   Procedure: CYSTOSCOPY WITH RETROGRADE /PYELOGRAM/;  Surgeon: Kathie Rhodes, MD;  Location: [facility];  Service: Urology;  Laterality: Left;  . CYSTOSCOPY WITH RETROGRADE PYELOGRAM, URETEROSCOPY AND STENT PLACEMENT Right 02/06/2015   Procedure: RETROGRADE PYELOGRAM, RIGHT URETEROSCOPY STENT PLACEMENT;  Surgeon: Kathie Rhodes, MD;  Location: [facility];  Service: Urology;  Laterality: Right;  . CYSTOSCOPY WITH RETROGRADE PYELOGRAM, URETEROSCOPY AND STENT PLACEMENT Right 03/07/2017   Procedure: CYSTOSCOPY WITH RIGHT RETROGRADE PYELOGRAM,RIGHT  URETEROSCOPYLASER LITHOTRIPSY  AND STENT PLACEMENT AND STONE BASKETRY;  Surgeon: Kathie Rhodes, MD;  Location: [place];  Service: Urology;  Laterality: Right;  . EYE SURGERY     cataract surgery  . fatty mass removal  1999   pubic area  . HOLMIUM LASER APPLICATION N/A 5/73/2202   Procedure: HOLMIUM LASER APPLICATION;  Surgeon: Kathie Rhodes, MD;  Location: [facility];  Service: Urology;  Laterality: N/A;  . HOLMIUM LASER APPLICATION Right 5/42/7062   Procedure: HOLMIUM LASER APPLICATION;  Surgeon: Kathie Rhodes, MD;  Location: [facility];  Service: Urology;  Laterality: Right;  . HOLMIUM LASER APPLICATION Left 3/76/2831   Procedure: HOLMIUM LASER APPLICATION;  Surgeon: Kathie Rhodes, MD;  Location: [facility];  Service: Urology;  Laterality: Left;  . IR FLUORO GUIDE PORT INSERTION RIGHT  02/24/2017  . IR NEPHROSTOMY PLACEMENT RIGHT  11/17/2017  . IR PATIENT EVAL TECH 0-60 MINS  03/11/2017  . IR REMOVAL TUN ACCESS W/ PORT W/O FL MOD SED  04/20/2018  . IR US GUIDE VASC  ACCESS RIGHT  02/24/2017  . KNEE ARTHROSCOPY     bilateral  . LAPAROSCOPIC GASTRIC BANDING  07/10/2010  . LAPAROSCOPIC GASTRIC BANDING     Laparoscopic adjustable banding APS System with posterior hiatal hernia, 2 suture.  Marland Kitchen LAPAROTOMY     for ruptured ovary and ovarian artery   . NEPHROLITHOTOMY Right 11/17/2017   Procedure: NEPHROLITHOTOMY PERCUTANEOUS;  Surgeon: Kathie Rhodes, MD;  Location: [facility];  Service: Urology;  Laterality: Right;  . PACEMAKER INSERTION  03/10/2013   MDT dual chamber PPM  . POCKET REVISION N/A 12/08/2013   Procedure: POCKET REVISION;  Surgeon: Deboraha Sprang, MD;  Location: [facility] CATH LAB;  Service: Cardiovascular;  Laterality: N/A;  . PORTA CATH INSERTION    .  REVERSE SHOULDER ARTHROPLASTY Right 05/14/2018   Procedure: RIGHT REVERSE SHOULDER ARTHROPLASTY;  Surgeon: Chandler, Justin, MD;  [facility]: [facility];  Service: Orthopedics;  Laterality: Right;  . REVERSE SHOULDER REPLACEMENT Right 05/14/2018  . RIGHT HEART CATH N/A 07/21/2019   Procedure: RIGHT HEART CATH;  Surgeon: McLean, Dalton S, MD;  [facility]: [facility] IN[facility]AS[facility];  Service: Cardiovascular;  Laterality: N/A;  . TEE WITH CARDIOVERSION  09/22/2017  . TEE WITHOUT CARDIOVERSION N/A 10/03/2017   Procedure: TRANSESOPHAGEAL ECHOCARDIOGRAM (TEE);  Surgeon: Croitoru, Mihai, MD;  Location: [facility] ENDOSCOPY;  Service: Cardiovascular;  Laterality: N/A;  . TEE WITHOUT CARDIOVERSION N/A 08/04/2019   Procedure: TRANSESOPHAGEAL ECHOCARDIOGRAM (TEE);  Surgeon: McLean, Dalton S, MD;  [facility]: [facility] ENDOS[facility];  Service: Cardiovascular;  Laterality: N/A;  . TEE WITHOUT CARDIOVERSION N/A 12/10/2019   Procedure: TRANSESOPHAGEAL ECHOCARDIOGRAM (TEE);  Surgeon: Christopher, Bridgette, MD;  [facility]: [facility]OS[facility];  Service: Cardiovascular;  Laterality: N/A;  . THYROIDECTOMY  1998   Dr Streck  . TONSILLECTOMY    . TOTAL KNEE ARTHROPLASTY  04/13/2012   Procedure: TOTAL KNEE ARTHROPLASTY;  Surgeon: Stephen D Lucey, MD;  Location: [facility];  Service:  Orthopedics;  Laterality: Right;  . VIDEO BRONCHOSCOPY WITH ENDOBRONCHIAL ULTRASOUND N/A 02/07/2017   Procedure: VIDEO BRONCHOSCOPY WITH ENDOBRONCHIAL ULTRASOUND;  Surgeon: Mannam, Praveen, MD;  Location: [facility];  Service: Pulmonary;  Laterality: N/A;    There were no vitals filed for this visit.  Subjective Assessment - 12/27/19 1053    Subjective  Pt states doing well.    Currently in Pain?  No/denies    Pain Score  0-No pain                       [facility] Adult PT Treatment/Exercise - 12/27/19 0920      Ambulation/Gait   Gait Comments  SPC: 35 ft x6 with practice for sequencing and mechanics;   Stepping up/over 2 in object x12, CGA.        Exercises   Exercises  Knee/Hip      Knee/Hip Exercises: Standing   Heel Raises  Both;20 reps    Hip Flexion  20 reps;Knee bent    Hip Abduction  2 sets;10 reps    Forward Step Up  10 reps;Both    Forward Step Up Limitations  AirEx    Stairs  up/down 5 steps, 2 hand rails. reciprocol, x4;     Other Standing Knee Exercises   Lateral stepping w weight shifts x20 bil;   Toe taps on Air Ex x20;       Knee/Hip Exercises: Seated   Long Arc Quad  20 reps;Both    Sit to Sand  without UE support;10 reps               PT Short Term Goals - 12/10/19 1503      PT SHORT TERM GOAL #1   Title  Pt to be independent with initial HEP for LE strength    Time  2    Period  Weeks    Status  New    Target Date  12/22/19      PT SHORT TERM GOAL #2   Title  Pt with be compliant with safe use of 4 WW at all times, for improved safety    Time  2    Period  Weeks    Status  New    Target Date  12/22/19        PT Long Term Goals -   12/10/19 1504      PT LONG TERM GOAL #1   Title  Pt to be independent with final HEP, for strength and balance    Time  6    Period  Weeks    Status  New    Target Date  01/19/20      PT LONG TERM GOAL #2   Title  Pt to demo improved score on BERG by at least 5 points, to improve balance and  safety    Time  6    Period  Weeks    Status  New    Target Date  01/19/20      PT LONG TERM GOAL #3   Title  Pt to demo improved LE strength to at least 4+/5 to improve stability and endurance.    Time  6    Period  Weeks    Status  New    Target Date  01/19/20      PT LONG TERM GOAL #4   Title  Pt to demo ability for ambulation with LRAD, for at least 500 ft, with direction changes and dynamic stability to be WFL for pt age.    Time  6    Period  Weeks    Status  New    Target Date  01/19/20            Plan - 12/27/19 1054    Clinical Impression Statement  Pt fatigued during/after session today, she states due to diffiuclty of exercises, HR stable (74), no other symptoms. Pt challenged with all stability exercises today. Gait training with SPC today ,pt did well, safe to do short distances at home. Unable to step up/over 7 in cone, but able to practice on 2 in object, still challenging. Plan to practice curb outside when weather permitting.    Personal Factors and Comorbidities  Comorbidity 1;Comorbidity 2    Comorbidities  A-Fib, dizziness, frequent falls, OA, Neuropathy    Examination-Activity Limitations  Bathing;Dressing;Hygiene/Grooming;Bend;Lift;Squat;Stairs;Locomotion Level    Examination-Participation Restrictions  Laundry;Cleaning;Shop;Community Activity;Meal Prep    Rehab Potential  Good    PT Frequency  2x / week    PT Duration  6 weeks    PT Treatment/Interventions  ADLs/Self Care Home Management;DME Instruction;Gait training;Stair training;Functional mobility training;Therapeutic activities;Therapeutic exercise;Balance training;Neuromuscular re-education;Patient/family education;Orthotic Fit/Training;Scar mobilization;Passive range of motion;Dry needling;Vestibular;Manual techniques;Canalith Repostioning;Cryotherapy;Electrical Stimulation;Iontophoresis 4mg/ml Dexamethasone;Moist Heat    Consulted and Agree with Plan of Care  Patient       Patient will benefit  from skilled therapeutic intervention in order to improve the following deficits and impairments:  Abnormal gait, Decreased balance, Decreased mobility, Difficulty walking, Dizziness, Decreased knowledge of precautions, Cardiopulmonary status limiting activity, Decreased activity tolerance, Decreased coordination, Decreased safety awareness, Decreased strength, Pain  Visit Diagnosis: Other abnormalities of gait and mobility     Problem List Patient Active Problem List   Diagnosis Date Noted  . ICH (intracerebral hemorrhage) (HCC) 12/02/2019  . A-fib (HCC) 12/02/2019  . Anemia 12/02/2019  . QT prolongation 12/02/2019  . Hypothyroidism 12/02/2019  . Gait abnormality 07/10/2018  . S/P reverse total shoulder arthroplasty, 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  . Persistent atrial fibrillation (HCC)   . Bilateral ureteral calculi 11/17/2017  . Atrial fibrillation with RVR (HCC) 09/15/2017  . Atrial flutter (HCC) 07/17/2017  . Port catheter in place 04/02/2017  . Non-Hodgkin lymphoma, unspecified, intrathoracic lymph nodes (HCC) 02/13/2017  . Mediastinal mass 02/06/2017  . 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 Abnormal chest x-ray   .   Lung mass 02/03/2017  . 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  . OSA (obstructive sleep apnea) 02/03/2015  . History of renal calculi 11/16/2014  . Renal calculi 11/16/2014  . Family history of colon cancer 10/26/2014  . Mechanical complication due to cardiac pacemaker pulse generator 11/06/2013  . Dyspnea 05/12/2013  . Hypothyroidism 04/21/2013  . Pleural effusion 04/19/2013  . Long term (current) use of anticoagulants 12/20/2010  . EPIDERMOID CYST 08/22/2010  . Chronic diastolic heart failure (HCC) 08/06/2010  . SYNCOPE AND COLLAPSE 07/27/2010  . OSTEOARTHRITIS, KNEE, RIGHT 03/15/2010  . Morbid obesity (HCC) 12/07/2009  . NONSPEC ELEVATION OF LEVELS OF TRANSAMINASE/LDH 09/08/2009  . BREAST CANCER, HX OF 07/25/2009  . COLONIC POLYPS, HX OF 07/25/2009   . TUBULOVILLOUS ADENOMA, COLON 04/29/2008  . HYPERGLYCEMIA, FASTING 04/29/2008  . HYPERLIPIDEMIA 06/22/2007  . CARCINOMA, THYROID GLAND, HX OF 06/22/2007    , PT, DPT 10:58 AM  12/27/19    [facility] [address] [place], [place], [address] Phone: [phone]   Fax:  [phone]  Name: Kayla Harrison MRN: [id number] Date of Birth: 11/02/1945   

## 2019-12-28 ENCOUNTER — Encounter (HOSPITAL_COMMUNITY): Payer: Medicare Other

## 2019-12-29 ENCOUNTER — Encounter: Payer: Self-pay | Admitting: Physical Therapy

## 2019-12-29 ENCOUNTER — Ambulatory Visit (INDEPENDENT_AMBULATORY_CARE_PROVIDER_SITE_OTHER): Payer: Medicare Other | Admitting: Physical Therapy

## 2019-12-29 ENCOUNTER — Other Ambulatory Visit: Payer: Self-pay

## 2019-12-29 DIAGNOSIS — R2689 Other abnormalities of gait and mobility: Secondary | ICD-10-CM | POA: Diagnosis not present

## 2019-12-29 NOTE — Therapy (Signed)
[facility] [address] [place], [place], [address] Phone: [phone]   Fax:  [phone]  Physical Therapy Treatment  Patient Details  Name: Kayla Harrison MRN: [id number] Date of Birth: 08-13-1946 Referring Provider (PT): Wendee Beavers   Encounter Date: 12/29/2019  PT End of Session - 12/29/19 0830    Visit Number  7    Number of Visits  12    PT Start Time  0801    PT Stop Time  0844    PT Time Calculation (min)  43 min    Equipment Utilized During Treatment  Gait belt    Activity Tolerance  Patient tolerated treatment well    Behavior During Therapy  [facility] for tasks assessed/performed       Past Medical History:  Diagnosis Date  . Anemia   . Arthritis    osteoarthritis - knees and right shoulder  . Atrial fibrillation [facility])    ablation- 2x's-- 1st time- [facility], 2nd event at [facility] in 2008. Convergent ablation at [facility] 6/14  . Atrial fibrillation ([place])   . Blood transfusion without reported diagnosis   . Breast cancer ([place])    Dr Margot Chimes, total thyroidectomy- 1999- for cancer  . 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 Chronic bilateral pleural effusions   . Colon polyp    Dr Earlean Shawl  . Complete heart block ([place])   . Complication of anesthesia    Ketamine produces LSD reaction, bright colored nightmarish experience   . Dyslipidemia   . Dyspnea   . Endometriosis   . Fibroids   . H/O pleural effusion    s/p thoracentesis w 3260m withdrawn  . Hepatitis    [place] ?hepatitis   . History of dysphagia    due to radiation therapy  . History of hiatal hernia    small noted on PET scan  . History of kidney stones   . Hx of thyroid cancer    Dr SForde Dandy . Hyperlipidemia   . Hypertension   . Hypothyroidism   . Lung cancer, lower lobe ([place] 01/2017   radiation RX completed 03/04/17; will start chemo 6/27, pt unaware of lung cancer  . Morbid obesity ([place]    Status post lap band surgery  .  Nephrolithiasis   . Non Hodgkin's lymphoma ([place]    on chemotherapy  . Persistent atrial fibrillation ([place]    a. s/p PVI 2008 b. s/p convergent ablation [id number] by bradycardia requiring pacemaker implant  . Personal history of radiation therapy   . Presence of permanent cardiac pacemaker   . Rotator cuff tear    Right  . Sinus node dysfunction ([place]    Complicating convergent ablation 6/14  . St[place] ([facility]    [place]  . SVC syndrome    with lung mass and non hodgkins lymphoma  . Thyroid cancer ([place] 2000    Past Surgical History:  Procedure Laterality Date  . ABDOMINAL HYSTERECTOMY  1983  . afib ablation     a. 2008 PVI b. 2014 convergent ablation  . APPENDECTOMY    . BONE MARROW BIOPSY  02/21/2017  . BREAST LUMPECTOMY Left 2010  . bso  1998  . CARDIAC CATHETERIZATION     2015- negative  . CARDIOVERSION  10/09/2012   Procedure: CARDIOVERSION;  Surgeon: JMinus Breeding MD;  [facility]  Service: Cardiovascular;  Laterality: N/A;  . CARDIOVERSION  10/09/2012   Procedure: CARDIOVERSION;  Surgeon: JMinus Breeding  MD;  [facility];  Service: Cardiovascular;  Laterality: N/A;  Mike gave the ok to add pt to the add on , but we must check to find out if the can add pt on at 1400 ( [phone])  . CARDIOVERSION N/A 11/20/2012   Procedure: CARDIOVERSION;  Surgeon: Paula V Ross, MD;  [facility];  Service: Cardiovascular;  Laterality: N/A;  . CARDIOVERSION N/A 07/18/2017   Procedure: CARDIOVERSION;  Surgeon: Nahser, Philip J, MD;  [facility];  Service: Cardiovascular;  Laterality: N/A;  . CARDIOVERSION N/A 10/03/2017   Procedure: CARDIOVERSION;  Surgeon: Croitoru, Mihai, MD;  [facility];  Service: Cardiovascular;  Laterality: N/A;  . CARDIOVERSION N/A 01/07/2018   Procedure: CARDIOVERSION;  Surgeon: Nahser, Philip J, MD;  [facility];  Service: Cardiovascular;  Laterality: N/A;  . CARDIOVERSION N/A 12/10/2019    Procedure: CARDIOVERSION;  Surgeon: Christopher, Bridgette, MD;  [facility];  Service: Cardiovascular;  Laterality: N/A;  . CHOLECYSTECTOMY    . COLONOSCOPY W/ POLYPECTOMY     Dr Medoff  . CYSTOSCOPY N/A 02/06/2015   Procedure: CYSTOSCOPY;  Surgeon: Mark Ottelin, MD;  [facility];  Service: Urology;  Laterality: N/A;  . CYSTOSCOPY W/ RETROGRADES Left 11/17/2017   Procedure: CYSTOSCOPY WITH RETROGRADE /PYELOGRAM/;  Surgeon: Ottelin, Mark, MD;  [facility];  Service: Urology;  Laterality: Left;  . CYSTOSCOPY WITH RETROGRADE PYELOGRAM, URETEROSCOPY AND STENT PLACEMENT Right 02/06/2015   Procedure: RETROGRADE PYELOGRAM, RIGHT URETEROSCOPY STENT PLACEMENT;  Surgeon: Mark Ottelin, MD;  [facility];  Service: Urology;  Laterality: Right;  . CYSTOSCOPY WITH RETROGRADE PYELOGRAM, URETEROSCOPY AND STENT PLACEMENT Right 03/07/2017   Procedure: CYSTOSCOPY WITH RIGHT RETROGRADE PYELOGRAM,RIGHT  URETEROSCOPYLASER LITHOTRIPSY  AND STENT PLACEMENT AND STONE BASKETRY;  Surgeon: Ottelin, Mark, MD;  Location: [facility];  Service: Urology;  Laterality: Right;  . EYE SURGERY     cataract surgery  . fatty mass removal  1999   pubic area  . HOLMIUM LASER APPLICATION N/A 02/06/2015   Procedure: HOLMIUM LASER APPLICATION;  Surgeon: Mark Ottelin, MD;  [facility];  Service: Urology;  Laterality: N/A;  . HOLMIUM LASER APPLICATION Right 03/07/2017   Procedure: HOLMIUM LASER APPLICATION;  Surgeon: Ottelin, Mark, MD;  Location:  SURGERY CENTER;  Service: Urology;  Laterality: Right;  . HOLMIUM LASER APPLICATION Left 11/17/2017   Procedure: HOLMIUM LASER APPLICATION;  Surgeon: Ottelin, Mark, MD;  [facility];  Service: Urology;  Laterality: Left;  . IR FLUORO GUIDE PORT INSERTION RIGHT  02/24/2017  . IR NEPHROSTOMY PLACEMENT RIGHT  11/17/2017  . IR PATIENT EVAL TECH 0-60 MINS  03/11/2017  . IR REMOVAL TUN ACCESS W/ PORT W/O FL MOD SED  04/20/2018  . IR US GUIDE VASC  ACCESS RIGHT  02/24/2017  . KNEE ARTHROSCOPY     bilateral  . LAPAROSCOPIC GASTRIC BANDING  07/10/2010  . LAPAROSCOPIC GASTRIC BANDING     Laparoscopic adjustable banding APS System with posterior hiatal hernia, 2 suture.  . LAPAROTOMY     for ruptured ovary and ovarian artery   . NEPHROLITHOTOMY Right 11/17/2017   Procedure: NEPHROLITHOTOMY PERCUTANEOUS;  Surgeon: Ottelin, Mark, MD;  [facility];  Service: Urology;  Laterality: Right;  . PACEMAKER INSERTION  03/10/2013   MDT dual chamber PPM  . POCKET REVISION N/A 12/08/2013   Procedure: POCKET REVISION;  Surgeon: Steven C Klein, MD;  [facility];  Service: Cardiovascular;  Laterality: N/A;  . PORTA CATH INSERTION    .   REVERSE SHOULDER ARTHROPLASTY Right 05/14/2018   Procedure: RIGHT REVERSE SHOULDER ARTHROPLASTY;  Surgeon: Tania Ade, MD;  Location: [facility];  Service: Orthopedics;  Laterality: Right;  . REVERSE SHOULDER REPLACEMENT Right 05/14/2018  . RIGHT HEART CATH N/A 07/21/2019   Procedure: RIGHT HEART CATH;  Surgeon: Larey Dresser, MD;  Location: [facility];  Service: Cardiovascular;  Laterality: N/A;  . TEE WITH CARDIOVERSION  09/22/2017  . TEE WITHOUT CARDIOVERSION N/A 10/03/2017   Procedure: TRANSESOPHAGEAL ECHOCARDIOGRAM (TEE);  Surgeon: Sanda Klein, MD;  Location: [facility] END[facility]COPY;  Service: Cardiovascular;  Laterality: N/A;  . TEE WITHOUT CARDIOVERSION N/A 08/04/2019   Procedure: TRANSESOPHAGEAL ECHOCARDIOGRAM (TEE);  Surgeon: Larey Dresser, MD;  Location: [facility];  Service: Cardiovascular;  Laterality: N/A;  . TEE WITHOUT CARDIOVERSION N/A 12/10/2019   Procedure: TRANSESOPHAGEAL ECHOCARDIOGRAM (TEE);  Surgeon: Buford Dresser, MD;  Location[facility];  Service: Cardiovascular;  Laterality: N/A;  . THYROIDECTOMY  1998   Dr Margot Chimes  . TONSILLECTOMY    . TOTAL KNEE ARTHROPLASTY  04/13/2012   Procedure: TOTAL KNEE ARTHROPLASTY;  Surgeon: Rudean Haskell, MD;  Location: [place];  Service:  Orthopedics;  Laterality: Right;  Marland Kitchen VIDEO BRONCHOSCOPY WITH ENDOBRONCHIAL ULTRASOUND N/A 02/07/2017   Procedure: VIDEO BRONCHOSCOPY WITH ENDOBRONCHIAL ULTRASOUND;  Surgeon: Marshell Garfinkel, MD;  Location: [facility];  Service: Pulmonary;  Laterality: N/A;    There were no vitals filed for this visit.  Subjective Assessment - 12/29/19 0811    Subjective  Pt with no new complaints. Is using cane around the house quite a bit    Currently in Pain?  No/denies    Pain Score  0-No pain         [facility] PT Assessment - 12/29/19 0001      Timed Up and Go Test   Normal TUG (seconds)  14.31    TUG Comments  used RW                   [facility] Adult PT Treatment/Exercise - 12/29/19 0001      Knee/Hip Exercises: Standing   Hip Flexion  20 reps;Knee bent    Stairs  up/down 5 steps, 1 hand rail. reciprocol, x4, SPC.     Other Standing Knee Exercises  A/P weight shifts x30;  mild ant/post perturbations x2 min (CGA),      Other Standing Knee Exercises  up/down curb x5 with SPC, x5 with RW, with education and practice for mechanics and safety (CGA)       Knee/Hip Exercises: Seated   Long Arc Quad  20 reps;Both    Long Arc Quad Weight  2 lbs.      Knee/Hip Exercises: Supine   Bridges  15 reps    Straight Leg Raises  5 reps;3 sets    Other Supine Knee/Hip Exercises  Supine march x20;  Clam GTB x20; education on TA x10;                PT Short Term Goals - 12/10/19 1503      PT SHORT TERM GOAL #1   Title  Pt to be independent with initial HEP for LE strength    Time  2    Period  Weeks    Status  New    Target Date  12/22/19      PT SHORT TERM GOAL #2   Title  Pt with be compliant with safe use of 4 WW at all times, for improved safety  Time  2    Period  Weeks    Status  New    Target Date  12/22/19        PT Long Term Goals - 12/10/19 1504      PT LONG TERM GOAL #1   Title  Pt to be independent with final HEP, for strength and balance    Time  6    Period  Weeks     Status  New    Target Date  01/19/20      PT LONG TERM GOAL #2   Title  Pt to demo improved score on BERG by at least 5 points, to improve balance and safety    Time  6    Period  Weeks    Status  New    Target Date  01/19/20      PT LONG TERM GOAL #3   Title  Pt to demo improved LE strength to at least 4+/5 to improve stability and endurance.    Time  6    Period  Weeks    Status  New    Target Date  01/19/20      PT LONG TERM GOAL #4   Title  Pt to demo ability for ambulation with LRAD, for at least 500 ft, with direction changes and dynamic stability to be [facility] for pt age.    Time  6    Period  Weeks    Status  New    Target Date  01/19/20            Plan - 12/29/19 0906    Clinical Impression Statement  Practice and education for navigating curbs outside today. Reviewed stepping up with strong leg and down with weaker leg, pt with diffiucty with this sequencing, and will benefit from continued practice with this.Minor LOB x2 with practice today. Pt with improving safety with use of SPC for ambulation, and improivng ability for LE strength and ther ex. Pt to benefit from continued care.    Personal Factors and Comorbidities  Comorbidity 1;Comorbidity 2    Comorbidities  A-Fib, dizziness, frequent falls, OA, Neuropathy    Examination-Activity Limitations  Bathing;Dressing;Hygiene/Grooming;Bend;Lift;Squat;Stairs;Locomotion Level    Examination-Participation Restrictions  Laundry;Cleaning;Shop;Community Activity;Meal Prep    Rehab Potential  Good    PT Frequency  2x / week    PT Duration  6 weeks    PT Treatment/Interventions  ADLs/Self Care Home Management;DME Instruction;Gait training;Stair training;Functional mobility training;Therapeutic activities;Therapeutic exercise;Balance training;Neuromuscular re-education;Patient/family education;Orthotic Fit/Training;Scar mobilization;Passive range of motion;Dry needling;Vestibular;Manual techniques;Canalith  Repostioning;Cryotherapy;Electrical Stimulation;Iontophoresis 41m/ml Dexamethasone;Moist Heat    Consulted and Agree with Plan of Care  Patient       Patient will benefit from skilled therapeutic intervention in order to improve the following deficits and impairments:  Abnormal gait, Decreased balance, Decreased mobility, Difficulty walking, Dizziness, Decreased knowledge of precautions, Cardiopulmonary status limiting activity, Decreased activity tolerance, Decreased coordination, Decreased safety awareness, Decreased strength, Pain  Visit Diagnosis: Other abnormalities of gait and mobility     Problem List Patient Active Problem List   Diagnosis Date Noted  . ICH (intracerebral hemorrhage) ([place] 12/02/2019  . A-fib ([place] 12/02/2019  . Anemia 12/02/2019  . QT prolongation 12/02/2019  . Hypothyroidism 12/02/2019  . Gait abnormality 07/10/2018  . S/P reverse total shoulder arthroplasty, 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  . Persistent atrial fibrillation ([place]   . Bilateral ureteral calculi 11/17/2017  . Atrial fibrillation with RVR ([place] 09/15/2017  . Atrial flutter ([place] 07/17/2017  . Port catheter in  place 04/02/2017  . Non-Hodgkin lymphoma, unspecified, intrathoracic lymph nodes ([place]) 02/13/2017  . Mediastinal mass 02/06/2017  . 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 Abnormal chest x-ray   . Lung mass 02/03/2017  . 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  . OSA (obstructive sleep apnea) 02/03/2015  . History of renal calculi 11/16/2014  . Renal calculi 11/16/2014  . Family history of colon cancer 10/26/2014  . Mechanical complication due to cardiac pacemaker pulse generator 11/06/2013  . Dyspnea 05/12/2013  . Hypothyroidism 04/21/2013  . Pleural effusion 04/19/2013  . Long term (current) use of anticoagulants 12/20/2010  . EPIDERMOID CYST 08/22/2010  . Chronic diastolic heart failure ([place]) 08/06/2010  . SYNCOPE AND COLLAPSE 07/27/2010  . OSTEOARTHRITIS, KNEE, RIGHT  03/15/2010  . Morbid obesity ([place]) 12/07/2009  . NONSPEC ELEVATION OF LEVELS OF TRANSAMINASE/LDH 09/08/2009  . BREAST CANCER, HX OF 07/25/2009  . COLONIC POLYPS, HX OF 07/25/2009  . TUBULOVILLOUS ADENOMA, COLON 04/29/2008  . HYPERGLYCEMIA, FASTING 04/29/2008  . HYPERLIPIDEMIA 06/22/2007  . CARCINOMA, THYROID GLAND, HX OF 06/22/2007   Lyndee Hensen, PT, DPT 9:08 AM  12/29/19   [facility] [place], [place], [address] Phone: [phone]   Fax:  [phone]  Name: AVYA FLAVELL MRN: [id number] Date of Birth: 1946/02/13

## 2019-12-30 ENCOUNTER — Ambulatory Visit
Admission: RE | Admit: 2019-12-30 | Discharge: 2019-12-30 | Disposition: A | Discharge status: Home / Self Care | Payer: Medicare Other | Source: Ambulatory Visit | Attending: Endocrinology | Admitting: Endocrinology

## 2019-12-30 ENCOUNTER — Encounter (HOSPITAL_COMMUNITY): Payer: Medicare Other

## 2019-12-30 DIAGNOSIS — S299XXA Unspecified injury of thorax, initial encounter: Secondary | ICD-10-CM | POA: Diagnosis not present

## 2019-12-30 DIAGNOSIS — M549 Dorsalgia, unspecified: Secondary | ICD-10-CM | POA: Diagnosis not present

## 2019-12-30 DIAGNOSIS — M546 Pain in thoracic spine: Secondary | ICD-10-CM

## 2019-12-30 IMAGING — CT CT CHEST W/O CM
2 of 3 series · 15 of 36 positions shown, 18 images · non-contrast
Comparison: 02/17/2018 and 09/15/2017

CLINICAL DATA: Follow-up right lung nodule. Personal history of
B-cell lymphoma.

EXAM:
CT CHEST WITHOUT CONTRAST
TECHNIQUE: Multidetector CT imaging of the chest was performed following the
standard protocol without IV contrast.

[Series 2: thorax · axial · 0.79mm/px · z∈[-291,-25]mm · 12 of 157 slices shown, 15 images]
[im 12/157  mediastinal]
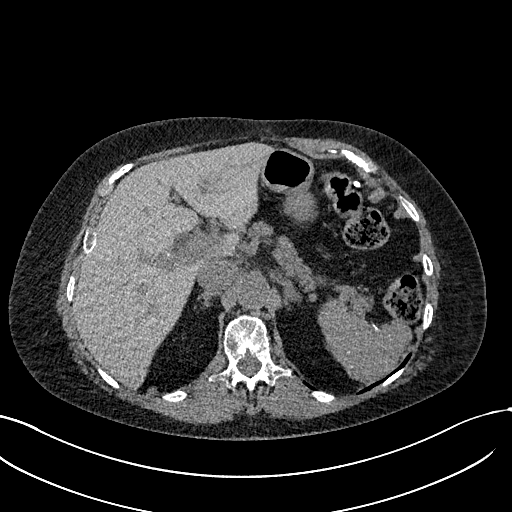
[im 12/157  lung]
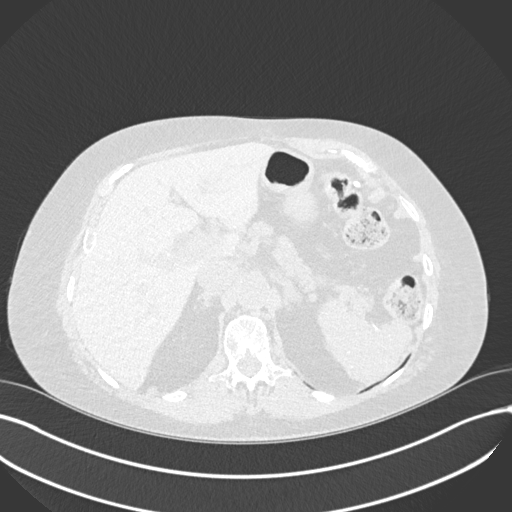
[im 24/157  lung]
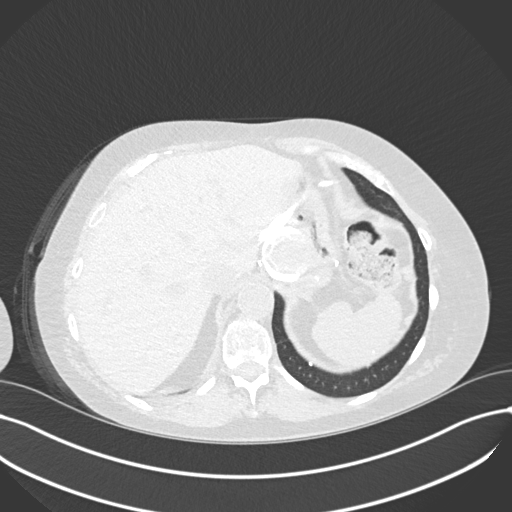
[im 35/157  lung]
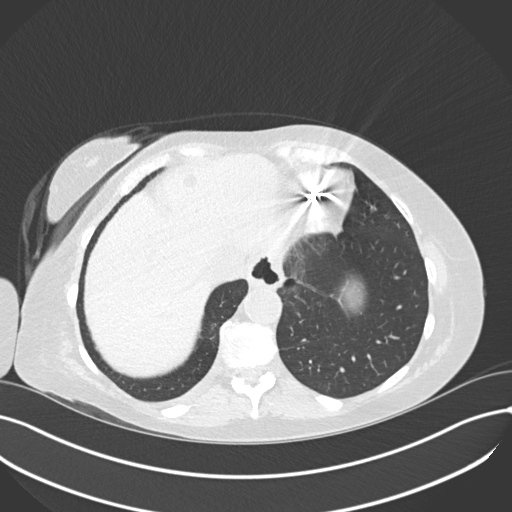
[im 47/157  lung]
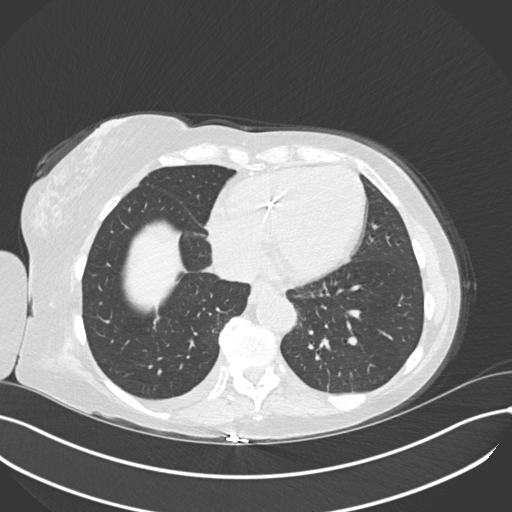
[im 58/157  mediastinal]
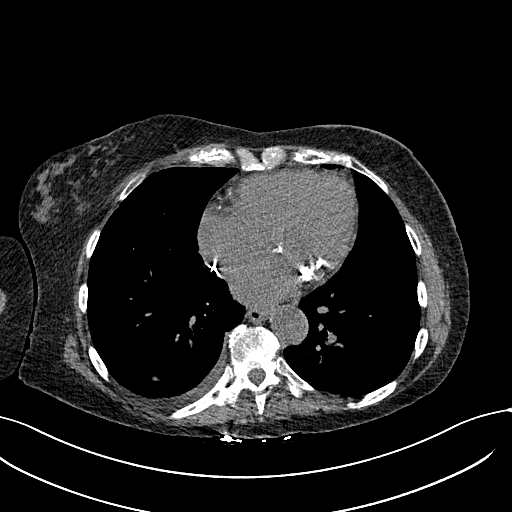
[im 58/157  lung]
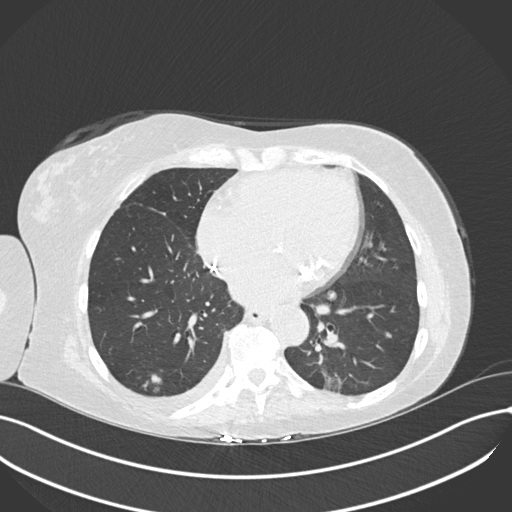
[im 70/157  lung]
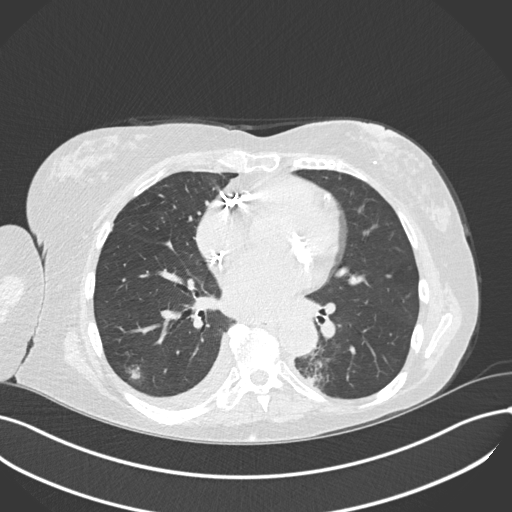
[im 87/157  lung]
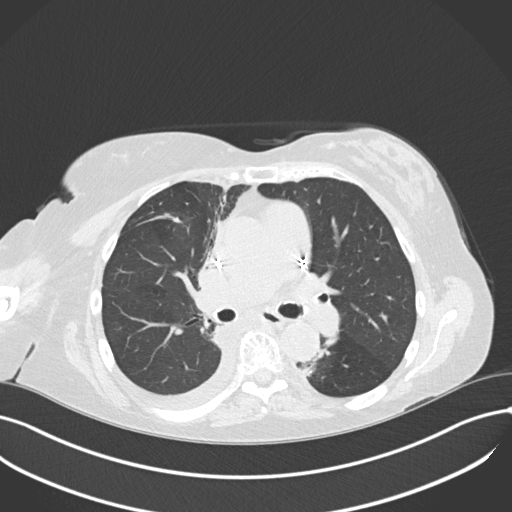
[im 99/157  lung]
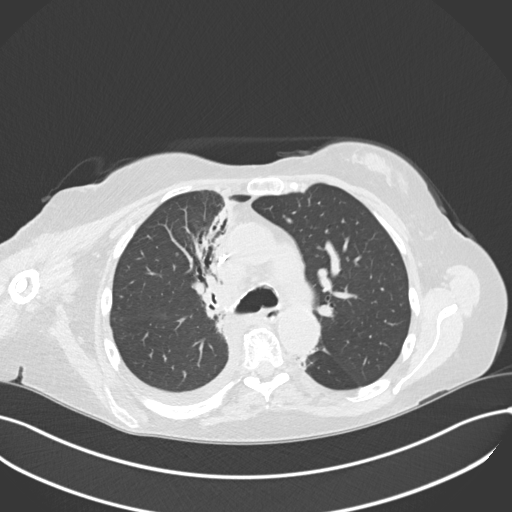
[im 110/157  mediastinal]
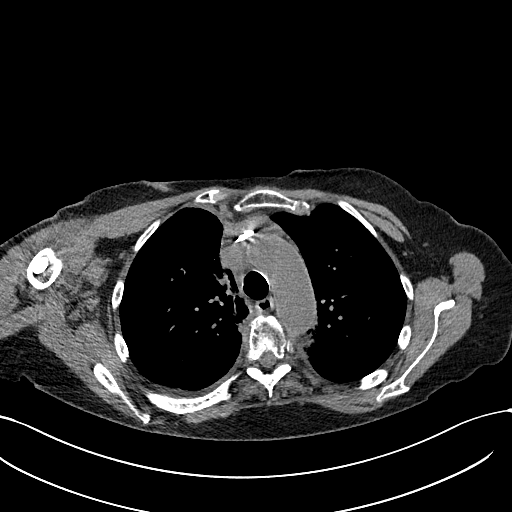
[im 110/157  lung]
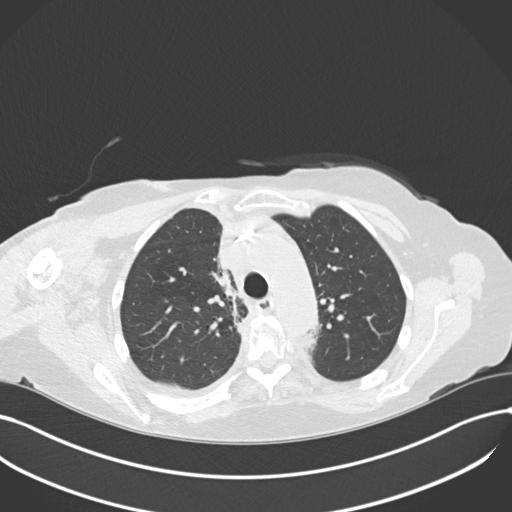
[im 122/157  lung]
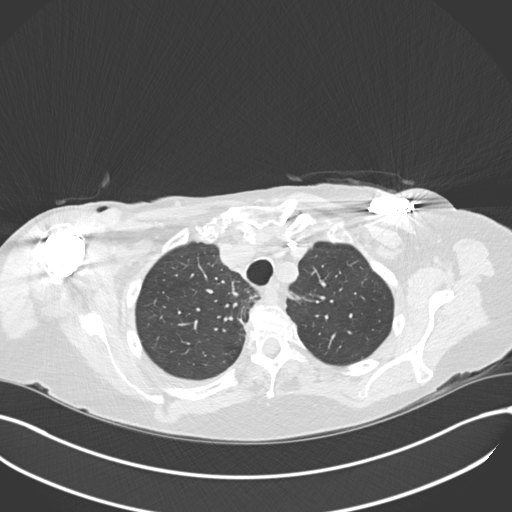
[im 133/157  lung]
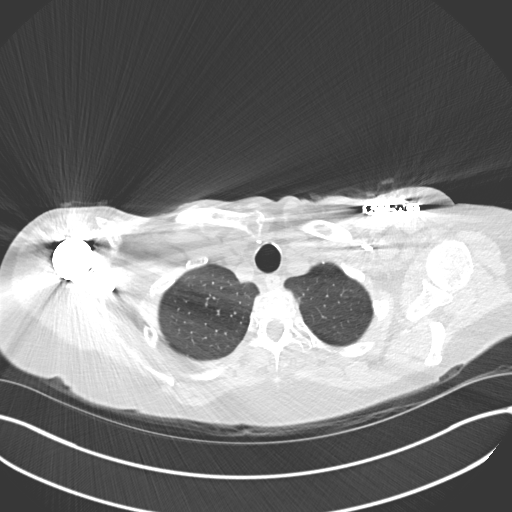
[im 145/157  lung]
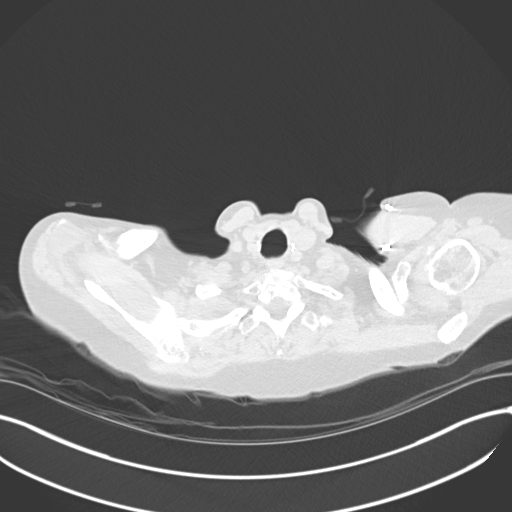

[Series 5: coronal · coronal · 0.61mm/px · 3 of 110 slices shown]
[im 22/110  lung]
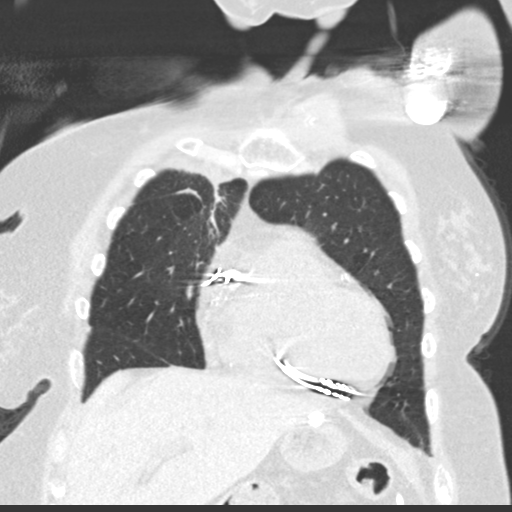
[im 44/110  lung]
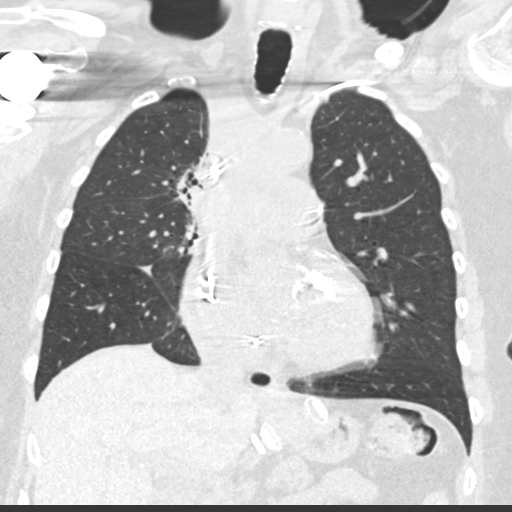
[im 66/110  lung]
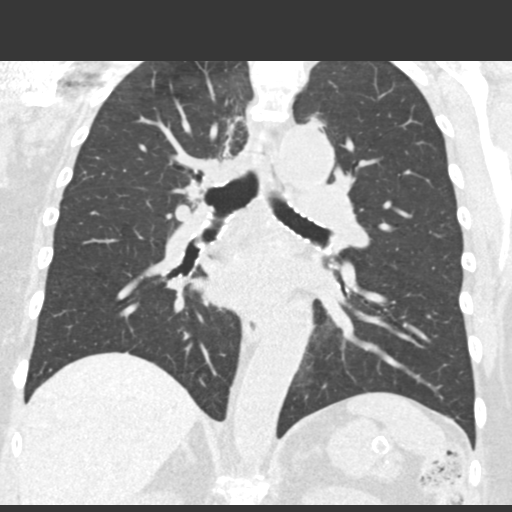

[15 of 36 positions shown; findings below may reference images not displayed]

FINDINGS: Cardiovascular: No acute findings. Aortic and coronary artery
atherosclerosis.

Mediastinum/Nodes: Stable 1.2 cm right paratracheal lymph node. No
new or increased lymphadenopathy seen on this unenhanced exam.

Lungs/Pleura: Paramediastinal radiation changes are stable. Focal
cluster of nodular densities in the posterior right lower lobe show
overall decrease since previous study, with largest residual area of
solid opacity measuring 1.3 cm on image 91/3, compared to 2.0 cm
previously. This is most consistent with resolving inflammatory or
infectious process. No new or enlarging pulmonary nodules or masses
identified. No evidence of acute infiltrate. New tiny right pleural
effusion noted.

Upper Abdomen: Normal adrenal glands. Stable small left hepatic lobe
cyst. Gastric lap band remains in appropriate position. Tiny
punctate calculi are noted in upper poles of both kidneys.

Musculoskeletal:  No suspicious bone lesions.
IMPRESSION: Decreased size of focal cluster of nodular opacities in the
posterior right lower lobe, consistent with resolving inflammatory
or infectious process. Stable 1.2 cm right paratracheal lymph node.
Suggest continued imaging follow-up by chest CT in 6-12 months.

New tiny right pleural effusion, which is of uncertain etiology and
clinical significance.

Stable 1.2 cm right paratracheal lymph node.

Aortic Atherosclerosis (OEZF6-IZ1.1) and Emphysema (OEZF6-KN2.N).
Coronary artery calcification.

## 2020-01-03 ENCOUNTER — Other Ambulatory Visit: Payer: Self-pay

## 2020-01-03 ENCOUNTER — Encounter: Payer: Self-pay | Admitting: Physical Therapy

## 2020-01-03 ENCOUNTER — Ambulatory Visit (INDEPENDENT_AMBULATORY_CARE_PROVIDER_SITE_OTHER): Payer: Medicare Other | Admitting: Physical Therapy

## 2020-01-03 DIAGNOSIS — R2689 Other abnormalities of gait and mobility: Secondary | ICD-10-CM

## 2020-01-03 NOTE — Therapy (Signed)
[place] [address] [place], [place], [address] Phone: [phone]   Fax:  [phone]  Physical Therapy Treatment  Patient Details  Name: Kayla Harrison MRN: [id number] Date of Birth: 08/14/1946 Referring Provider (PT): Wendee Beavers   Encounter Date: 01/03/2020  PT End of Session - 01/03/20 1145    Visit Number  8    Number of Visits  12    PT Start Time  1017    PT Stop Time  1059    PT Time Calculation (min)  42 min    Equipment Utilized During Treatment  Gait belt    Activity Tolerance  Patient tolerated treatment well    Behavior During Therapy  [facility] for tasks assessed/performed       Past Medical History:  Diagnosis Date  . Anemia   . Arthritis    osteoarthritis - knees and right shoulder  . Atrial fibrillation [facility])    ablation- 2x's-- 1st time- [facility], 2nd event at [facility] in 2008. Convergent ablation at [facility] 6/14  . Atrial fibrillation ([place])   . Blood transfusion without reported diagnosis   . Breast cancer ([place])    Dr Margot Chimes, total thyroidectomy- 1999- for cancer  . 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 Chronic bilateral pleural effusions   . Colon polyp    Dr Earlean Shawl  . Complete heart block ([place])   . Complication of anesthesia    Ketamine produces LSD reaction, bright colored nightmarish experience   . Dyslipidemia   . Dyspnea   . Endometriosis   . Fibroids   . H/O pleural effusion    s/p thoracentesis w 3277m withdrawn  . Hepatitis    [place]hepatitis   . History of dysphagia    due to radiation therapy  . History of hiatal hernia    small noted on PET scan  . History of kidney stones   . Hx of thyroid cancer    Dr SForde Dandy . Hyperlipidemia   . Hypertension   . Hypothyroidism   . Lung cancer, [place] lobe [place]   radiation RX completed 03/04/17; will start chemo 6/27, pt unaware of lung cancer  . Morbid obesity ([place]    Status post lap band surgery  .  Nephrolithiasis   . Non Hodgkin's lymphoma ([place]    on chemotherapy  . Persistent atrial fibrillation ([place]    a. s/p PVI 2008 b. s/p convergent ablation [id number] by bradycardia requiring pacemaker implant  . Personal history of radiation therapy   . Presence of permanent cardiac pacemaker   . Rotator cuff tear    Right  . Sinus node dysfunction ([place]    Complicating convergent ablation 6/14  . [place] ([facility]    [place]  . SVC syndrome    with lung mass and non hodgkins lymphoma  . Thyroid cancer ([place] 2000    Past Surgical History:  Procedure Laterality Date  . ABDOMINAL HYSTERECTOMY  1983  . afib ablation     a. 2008 PVI b. 2014 convergent ablation  . APPENDECTOMY    . BONE MARROW BIOPSY  02/21/2017  . BREAST LUMPECTOMY Left 2010  . bso  1998  . CARDIAC CATHETERIZATION     2015- negative  . CARDIOVERSION  10/09/2012   Procedure: CARDIOVERSION;  Surgeon: JMinus Breeding MD;  [place]  Service: Cardiovascular;  Laterality: N/A;  . CARDIOVERSION  10/09/2012   Procedure: CARDIOVERSION;  Surgeon: JMinus Breeding  MD;  [facility];  Service: Cardiovascular;  Laterality: N/A;  Mike gave the ok to add pt to the add on , but we must check to find out if the can add pt on at 1400 ( [phone])  . CARDIOVERSION N/A 11/20/2012   Procedure: CARDIOVERSION;  Surgeon: Paula V Ross, MD;  [facility];  Service: Cardiovascular;  Laterality: N/A;  . CARDIOVERSION N/A 07/18/2017   Procedure: CARDIOVERSION;  Surgeon: Nahser, Philip J, MD;  [facility];  Service: Cardiovascular;  Laterality: N/A;  . CARDIOVERSION N/A 10/03/2017   Procedure: CARDIOVERSION;  Surgeon: Croitoru, Mihai, MD;  [facility];  Service: Cardiovascular;  Laterality: N/A;  . CARDIOVERSION N/A 01/07/2018   Procedure: CARDIOVERSION;  Surgeon: Nahser, Philip J, MD;  [facility];  Service: Cardiovascular;  Laterality: N/A;  . CARDIOVERSION N/A 12/10/2019    Procedure: CARDIOVERSION;  Surgeon: Christopher, Bridgette, MD;  [facility];  Service: Cardiovascular;  Laterality: N/A;  . CHOLECYSTECTOMY    . COLONOSCOPY W/ POLYPECTOMY     Dr Medoff  . CYSTOSCOPY N/A 02/06/2015   Procedure: CYSTOSCOPY;  Surgeon: Mark Ottelin, MD;  [facility];  Service: Urology;  Laterality: N/A;  . CYSTOSCOPY W/ RETROGRADES Left 11/17/2017   Procedure: CYSTOSCOPY WITH RETROGRADE /PYELOGRAM/;  Surgeon: Ottelin, Mark, MD;  [facility];  Service: Urology;  Laterality: Left;  . CYSTOSCOPY WITH RETROGRADE PYELOGRAM, URETEROSCOPY AND STENT PLACEMENT Right 02/06/2015   Procedure: RETROGRADE PYELOGRAM, RIGHT URETEROSCOPY STENT PLACEMENT;  Surgeon: Mark Ottelin, MD;  [facility];  Service: Urology;  Laterality: Right;  . CYSTOSCOPY WITH RETROGRADE PYELOGRAM, URETEROSCOPY AND STENT PLACEMENT Right 03/07/2017   Procedure: CYSTOSCOPY WITH RIGHT RETROGRADE PYELOGRAM,RIGHT  URETEROSCOPYLASER LITHOTRIPSY  AND STENT PLACEMENT AND STONE BASKETRY;  Surgeon: Ottelin, Mark, MD;  Location: [facility];  Service: Urology;  Laterality: Right;  . EYE SURGERY     cataract surgery  . fatty mass removal  1999   pubic area  . HOLMIUM LASER APPLICATION N/A 02/06/2015   Procedure: HOLMIUM LASER APPLICATION;  Surgeon: Mark Ottelin, MD;  [facility];  Service: Urology;  Laterality: N/A;  . HOLMIUM LASER APPLICATION Right 03/07/2017   Procedure: HOLMIUM LASER APPLICATION;  Surgeon: Ottelin, Mark, MD;  Location: [facility];  Service: Urology;  Laterality: Right;  . HOLMIUM LASER APPLICATION Left 11/17/2017   Procedure: HOLMIUM LASER APPLICATION;  Surgeon: Ottelin, Mark, MD;  [facility];  Service: Urology;  Laterality: Left;  . IR FLUORO GUIDE PORT INSERTION RIGHT  02/24/2017  . IR NEPHROSTOMY PLACEMENT RIGHT  11/17/2017  . IR PATIENT EVAL TECH 0-60 MINS  03/11/2017  . IR REMOVAL TUN ACCESS W/ PORT W/O FL MOD SED  04/20/2018  . IR US GUIDE VASC  ACCESS RIGHT  02/24/2017  . KNEE ARTHROSCOPY     bilateral  . LAPAROSCOPIC GASTRIC BANDING  07/10/2010  . LAPAROSCOPIC GASTRIC BANDING     Laparoscopic adjustable banding APS System with posterior hiatal hernia, 2 suture.  . LAPAROTOMY     for ruptured ovary and ovarian artery   . NEPHROLITHOTOMY Right 11/17/2017   Procedure: NEPHROLITHOTOMY PERCUTANEOUS;  Surgeon: Ottelin, Mark, MD;  [facility];  Service: Urology;  Laterality: Right;  . PACEMAKER INSERTION  03/10/2013   MDT dual chamber PPM  . POCKET REVISION N/A 12/08/2013   Procedure: POCKET REVISION;  Surgeon: Steven C Klein, MD;  [facility];  Service: Cardiovascular;  Laterality: N/A;  . PORTA CATH INSERTION    .   REVERSE SHOULDER ARTHROPLASTY Right 05/14/2018   Procedure: RIGHT REVERSE SHOULDER ARTHROPLASTY;  Surgeon: Tania Ade, MD;  Location: [place];  Service: Orthopedics;  Laterality: Right;  . REVERSE SHOULDER REPLACEMENT Right 05/14/2018  . RIGHT HEART CATH N/A 07/21/2019   Procedure: RIGHT HEART CATH;  Surgeon: Larey Dresser, MD;  Location: [place] [facility];  Service: Cardiovascular;  Laterality: N/A;  . TEE WITH CARDIOVERSION  09/22/2017  . TEE WITHOUT CARDIOVERSION N/A 10/03/2017   Procedure: TRANSESOPHAGEAL ECHOCARDIOGRAM (TEE);  Surgeon: Sanda Klein, MD;  Location: [facility];  Service: Cardiovascular;  Laterality: N/A;  . TEE WITHOUT CARDIOVERSION N/A 08/04/2019   Procedure: TRANSESOPHAGEAL ECHOCARDIOGRAM (TEE);  Surgeon: Larey Dresser, MD;  [facility]: [facility];  Service: Cardiovascular;  Laterality: N/A;  . TEE WITHOUT CARDIOVERSION N/A 12/10/2019   Procedure: TRANSESOPHAGEAL ECHOCARDIOGRAM (TEE);  Surgeon: Buford Dresser, MD;  Location: [facility]COPY;  Service: Cardiovascular;  Laterality: N/A;  . THYROIDECTOMY  1998   Dr Margot Chimes  . TONSILLECTOMY    . TOTAL KNEE ARTHROPLASTY  04/13/2012   Procedure: TOTAL KNEE ARTHROPLASTY;  Surgeon: Rudean Haskell, MD;  Location: [place];  Service:  Orthopedics;  Laterality: Right;  Marland Kitchen VIDEO BRONCHOSCOPY WITH ENDOBRONCHIAL ULTRASOUND N/A 02/07/2017   Procedure: VIDEO BRONCHOSCOPY WITH ENDOBRONCHIAL ULTRASOUND;  Surgeon: Marshell Garfinkel, MD;  Location: [place];  Service: Pulmonary;  Laterality: N/A;    There were no vitals filed for this visit.  Subjective Assessment - 01/03/20 1144    Subjective  Pt states doing well. Felt more off balance over the weekend, no falls. Feels pain in R shoulder blade region, has had this before a couple times, since her R shoulder replacement.    Currently in Pain?  Yes    Pain Score  4     Pain Location  Shoulder    Pain Orientation  Right    Pain Descriptors / Indicators  Sore    Pain Type  Acute pain    Pain Onset  1 to 4 weeks ago    Pain Frequency  Intermittent         [facility] PT Assessment - 01/03/20 1023      Timed Up and Go Test   Normal TUG (seconds)  14.82    TUG Comments  SPC                   [facility] Adult PT Treatment/Exercise - 01/03/20 1023      Ambulation/Gait   Gait Comments  102f x6, RW, L/R head turns.       Knee/Hip Exercises: Standing   Hip Flexion  20 reps;Knee bent    Stairs  up/down 5 steps, 2 hand rails. reciprocol, x4,      Other Standing Knee Exercises  A/P weight shifts x30;  mild ant/post perturbations x2 min (CGA),   DLS with L/R head turns x20;  DLS with eyes closed x 1 min;       Knee/Hip Exercises: Seated   Long Arc Quad  20 reps;Both    Long Arc Quad Weight  2 lbs.    Sit to Sand  without UE support;10 reps      Knee/Hip Exercises: Supine   Bridges  --    Straight Leg Raises  --             PT Education - 01/03/20 1145    Education Details  Education on use of heat, and stretch for R shoulder blade, as well as optimal posture for activity with  R UE, and with walker.    Person(s) Educated  Patient    Methods  Explanation;Demonstration    Comprehension  Verbalized understanding;Returned demonstration       PT Short Term Goals -  12/10/19 1503      PT SHORT TERM GOAL #1   Title  Pt to be independent with initial HEP for LE strength    Time  2    Period  Weeks    Status  New    Target Date  12/22/19      PT SHORT TERM GOAL #2   Title  Pt with be compliant with safe use of 4 WW at all times, for improved safety    Time  2    Period  Weeks    Status  New    Target Date  12/22/19        PT Long Term Goals - 12/10/19 1504      PT LONG TERM GOAL #1   Title  Pt to be independent with final HEP, for strength and balance    Time  6    Period  Weeks    Status  New    Target Date  01/19/20      PT LONG TERM GOAL #2   Title  Pt to demo improved score on BERG by at least 5 points, to improve balance and safety    Time  6    Period  Weeks    Status  New    Target Date  01/19/20      PT LONG TERM GOAL #3   Title  Pt to demo improved LE strength to at least 4+/5 to improve stability and endurance.    Time  6    Period  Weeks    Status  New    Target Date  01/19/20      PT LONG TERM GOAL #4   Title  Pt to demo ability for ambulation with LRAD, for at least 500 ft, with direction changes and dynamic stability to be [facility] for pt age.    Time  6    Period  Weeks    Status  New    Target Date  01/19/20            Plan - 01/03/20 1147    Clinical Impression Statement  Pt with soreness in R rhomoid with palpation today, discussed heat, stretching, and posture. Pt doing well with balance exercises during sessons. Still has low confidence with stairs, curbs, and standing activity. Pt to benefit from continued care.    Personal Factors and Comorbidities  Comorbidity 1;Comorbidity 2    Comorbidities  A-Fib, dizziness, frequent falls, OA, Neuropathy    Examination-Activity Limitations  Bathing;Dressing;Hygiene/Grooming;Bend;Lift;Squat;Stairs;Locomotion Level    Examination-Participation Restrictions  Laundry;Cleaning;Shop;Community Activity;Meal Prep    Rehab Potential  Good    PT Frequency  2x / week    PT  Duration  6 weeks    PT Treatment/Interventions  ADLs/Self Care Home Management;DME Instruction;Gait training;Stair training;Functional mobility training;Therapeutic activities;Therapeutic exercise;Balance training;Neuromuscular re-education;Patient/family education;Orthotic Fit/Training;Scar mobilization;Passive range of motion;Dry needling;Vestibular;Manual techniques;Canalith Repostioning;Cryotherapy;Electrical Stimulation;Iontophoresis '4mg'$ /ml Dexamethasone;Moist Heat    Consulted and Agree with Plan of Care  Patient       Patient will benefit from skilled therapeutic intervention in order to improve the following deficits and impairments:  Abnormal gait, Decreased balance, Decreased mobility, Difficulty walking, Dizziness, Decreased knowledge of precautions, Cardiopulmonary status limiting activity, Decreased activity tolerance, Decreased coordination, Decreased safety awareness, Decreased strength,  Pain  Visit Diagnosis: Other abnormalities of gait and mobility     Problem List Patient Active Problem List   Diagnosis Date Noted  . ICH (intracerebral hemorrhage) ([place]) 12/02/2019  . A-fib ([place]) 12/02/2019  . Anemia 12/02/2019  . QT prolongation 12/02/2019  . Hypothyroidism 12/02/2019  . Gait abnormality 07/10/2018  . S/P reverse total shoulder arthroplasty, 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  . Persistent atrial fibrillation ([place])   . Bilateral ureteral calculi 11/17/2017  . Atrial fibrillation with RVR ([place]) 09/15/2017  . Atrial flutter ([place]) 07/17/2017  . Port catheter in place 04/02/2017  . Non-Hodgkin lymphoma, unspecified, intrathoracic lymph nodes ([place]) 02/13/2017  . Mediastinal mass 02/06/2017  . 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 Abnormal chest x-ray   . Lung mass 02/03/2017  . 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  . OSA (obstructive sleep apnea) 02/03/2015  . History of renal calculi 11/16/2014  . Renal calculi 11/16/2014  . Family history of colon cancer 10/26/2014   . Mechanical complication due to cardiac pacemaker pulse generator 11/06/2013  . Dyspnea 05/12/2013  . Hypothyroidism 04/21/2013  . Pleural effusion 04/19/2013  . Long term (current) use of anticoagulants 12/20/2010  . EPIDERMOID CYST 08/22/2010  . Chronic diastolic heart failure ([place]) 08/06/2010  . SYNCOPE AND COLLAPSE 07/27/2010  . OSTEOARTHRITIS, KNEE, RIGHT 03/15/2010  . Morbid obesity ([place]) 12/07/2009  . NONSPEC ELEVATION OF LEVELS OF TRANSAMINASE/LDH 09/08/2009  . BREAST CANCER, HX OF 07/25/2009  . COLONIC POLYPS, HX OF 07/25/2009  . TUBULOVILLOUS ADENOMA, COLON 04/29/2008  . HYPERGLYCEMIA, FASTING 04/29/2008  . HYPERLIPIDEMIA 06/22/2007  . CARCINOMA, THYROID GLAND, HX OF 06/22/2007    Lyndee Hensen, PT, DPT 11:52 AM  01/03/20    [facility] [address] [place], [place], [address] Phone: [phone]   Fax:  [phone]  Name: Kayla Harrison MRN: [id number] Date of Birth: Oct 03, 1945

## 2020-01-04 ENCOUNTER — Encounter (HOSPITAL_COMMUNITY): Payer: Medicare Other

## 2020-01-04 ENCOUNTER — Telehealth: Payer: Self-pay

## 2020-01-04 ENCOUNTER — Other Ambulatory Visit: Payer: Self-pay

## 2020-01-04 DIAGNOSIS — Z85828 Personal history of other malignant neoplasm of skin: Secondary | ICD-10-CM | POA: Diagnosis not present

## 2020-01-04 DIAGNOSIS — L570 Actinic keratosis: Secondary | ICD-10-CM | POA: Diagnosis not present

## 2020-01-04 DIAGNOSIS — L905 Scar conditions and fibrosis of skin: Secondary | ICD-10-CM | POA: Diagnosis not present

## 2020-01-04 DIAGNOSIS — L308 Other specified dermatitis: Secondary | ICD-10-CM | POA: Diagnosis not present

## 2020-01-04 MED ORDER — AMIODARONE HCL 200 MG PO TABS: 400.0000 mg | ORAL_TABLET | Freq: Two times a day (BID) | ORAL | 2 refills | Status: DC

## 2020-01-04 NOTE — Telephone Encounter (Signed)
Pt's medication was sent to pt's pharmacy as requested. Confirmation received.  °

## 2020-01-04 NOTE — Telephone Encounter (Signed)
[facility] mail order pharmacy requesting clarification on pt taking Amiodarone 200 mg tablet, 2 tablets BID. This medication was increase in the hospital. Does pt still supposed to be taking this much Amiodarone, because it may require a Prior Auth? Ph# [phone]. [phone]. Please address

## 2020-01-05 ENCOUNTER — Encounter: Payer: Self-pay | Admitting: Physical Therapy

## 2020-01-05 ENCOUNTER — Ambulatory Visit (INDEPENDENT_AMBULATORY_CARE_PROVIDER_SITE_OTHER): Payer: Medicare Other | Admitting: Physical Therapy

## 2020-01-05 ENCOUNTER — Telehealth: Payer: Self-pay | Admitting: *Deleted

## 2020-01-05 ENCOUNTER — Other Ambulatory Visit: Payer: Self-pay

## 2020-01-05 ENCOUNTER — Ambulatory Visit
Admission: RE | Admit: 2020-01-05 | Discharge: 2020-01-05 | Disposition: A | Discharge status: Home / Self Care | Payer: Medicare Other | Source: Ambulatory Visit | Attending: Endocrinology | Admitting: Endocrinology

## 2020-01-05 DIAGNOSIS — R2689 Other abnormalities of gait and mobility: Secondary | ICD-10-CM

## 2020-01-05 DIAGNOSIS — Z1231 Encounter for screening mammogram for malignant neoplasm of breast: Secondary | ICD-10-CM

## 2020-01-05 NOTE — Telephone Encounter (Signed)
[facility] ([facility]) advising that 01/06/20 [facility] appointment was scheduled in error and has been canceled. Pt is up to date with remote monitoring and is scheduled to see Dr. Caryl Comes on 01/24/20. DC phone number provided for any questions/concerns.

## 2020-01-05 NOTE — Therapy (Signed)
[facility] [address] [place], [place], [address] Phone: [phone]   Fax:  [phone]  Physical Therapy Treatment  Patient Details  Name: Kayla Harrison MRN: [id number] Date of Birth: [birth year] Referring Provider (PT): Wendee Beavers   Encounter Date: 01/05/2020  PT End of Session - 01/05/20 1202    Visit Number  9    Number of Visits  12    PT Start Time  1020    PT Stop Time  1100    PT Time Calculation (min)  40 min    Equipment Utilized During Treatment  Gait belt    Activity Tolerance  Patient tolerated treatment well    Behavior During Therapy  [facility] for tasks assessed/performed       Past Medical History:  Diagnosis Date  . Anemia   . Arthritis    osteoarthritis - knees and right shoulder  . Atrial fibrillation [facility])    ablation- 2x's-- 1st time- [facility], 2nd event at [facility] in 2008. Convergent ablation at [facility] 6/14  . Atrial fibrillation ([place])   . Blood transfusion without reported diagnosis   . Breast cancer ([place])    Dr Margot Chimes, total thyroidectomy- 1999- for cancer  . 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 Chronic bilateral pleural effusions   . Colon polyp    Dr Earlean Shawl  . Complete heart block ([place])   . Complication of anesthesia    Ketamine produces LSD reaction, bright colored nightmarish experience   . Dyslipidemia   . Dyspnea   . Endometriosis   . Fibroids   . H/O pleural effusion    s/p thoracentesis w 3226m withdrawn  . Hepatitis    [place]hepatitis   . History of dysphagia    due to radiation therapy  . History of hiatal hernia    small noted on PET scan  . History of kidney stones   . Hx of thyroid cancer    Dr SForde Dandy . Hyperlipidemia   . Hypertension   . Hypothyroidism   . Lung cancer[place] lobe [place] 01[place]   radiation RX completed 03/04/17; will start chemo 6/27, pt unaware of lung cancer  . Morbid obesity ([place]    Status post lap band surgery  .  Nephrolithiasis   . Non Hodgkin's lymphoma ([place]    on chemotherapy  . Persistent atrial fibrillation ([place]    a. s/p PVI 2008 b. s/p convergent ablation 29528complicated by bradycardia requiring pacemaker implant  . Personal history of radiation therapy   . Presence of permanent cardiac pacemaker   . Rotator cuff tear    Right  . Sinus node dysfunction ([place]    Complicating convergent ablation 6/14  . St[place] ([facility]    [place]  . SVC syndrome    with lung mass and non hodgkins lymphoma  . Thyroid cancer ([place] 2000    Past Surgical History:  Procedure Laterality Date  . ABDOMINAL HYSTERECTOMY  1983  . afib ablation     a. 2008 PVI b. 2014 convergent ablation  . APPENDECTOMY    . BONE MARROW BIOPSY  02/21/2017  . BREAST LUMPECTOMY Left 2010  . bso  1998  . CARDIAC CATHETERIZATION     2015- negative  . CARDIOVERSION  10/09/2012   Procedure: CARDIOVERSION;  Surgeon: JMinus Breeding MD;  [place]  Service: Cardiovascular;  Laterality: N/A;  . CARDIOVERSION  10/09/2012   Procedure: CARDIOVERSION;  Surgeon: JMinus Breeding  MD;  [facility];  Service: Cardiovascular;  Laterality: N/A;  Mike gave the ok to add pt to the add on , but we must check to find out if the can add pt on at 1400 ( [phone])  . CARDIOVERSION N/A 11/20/2012   Procedure: CARDIOVERSION;  Surgeon: Paula V Ross, MD;  [facility];  Service: Cardiovascular;  Laterality: N/A;  . CARDIOVERSION N/A 07/18/2017   Procedure: CARDIOVERSION;  Surgeon: Nahser, Philip J, MD;  [facility];  Service: Cardiovascular;  Laterality: N/A;  . CARDIOVERSION N/A 10/03/2017   Procedure: CARDIOVERSION;  Surgeon: Croitoru, Mihai, MD;  [facility];  Service: Cardiovascular;  Laterality: N/A;  . CARDIOVERSION N/A 01/07/2018   Procedure: CARDIOVERSION;  Surgeon: Nahser, Philip J, MD;  [facility];  Service: Cardiovascular;  Laterality: N/A;  . CARDIOVERSION N/A 12/10/2019    Procedure: CARDIOVERSION;  Surgeon: Christopher, Bridgette, MD;  [facility];  Service: Cardiovascular;  Laterality: N/A;  . CHOLECYSTECTOMY    . COLONOSCOPY W/ POLYPECTOMY     Dr Medoff  . CYSTOSCOPY N/A 02/06/2015   Procedure: CYSTOSCOPY;  Surgeon: Mark Ottelin, MD;  [facility];  Service: Urology;  Laterality: N/A;  . CYSTOSCOPY W/ RETROGRADES Left 11/17/2017   Procedure: CYSTOSCOPY WITH RETROGRADE /PYELOGRAM/;  Surgeon: Ottelin, Mark, MD;  [facility];  Service: Urology;  Laterality: Left;  . CYSTOSCOPY WITH RETROGRADE PYELOGRAM, URETEROSCOPY AND STENT PLACEMENT Right 02/06/2015   Procedure: RETROGRADE PYELOGRAM, RIGHT URETEROSCOPY STENT PLACEMENT;  Surgeon: Mark Ottelin, MD;  [facility];  Service: Urology;  Laterality: Right;  . CYSTOSCOPY WITH RETROGRADE PYELOGRAM, URETEROSCOPY AND STENT PLACEMENT Right 03/07/2017   Procedure: CYSTOSCOPY WITH RIGHT RETROGRADE PYELOGRAM,RIGHT  URETEROSCOPYLASER LITHOTRIPSY  AND STENT PLACEMENT AND STONE BASKETRY;  Surgeon: Ottelin, Mark, MD;  Location: [facility];  Service: Urology;  Laterality: Right;  . EYE SURGERY     cataract surgery  . fatty mass removal  1999   pubic area  . HOLMIUM LASER APPLICATION N/A 02/06/2015   Procedure: HOLMIUM LASER APPLICATION;  Surgeon: Mark Ottelin, MD;  [facility];  Service: Urology;  Laterality: N/A;  . HOLMIUM LASER APPLICATION Right 03/07/2017   Procedure: HOLMIUM LASER APPLICATION;  Surgeon: Ottelin, Mark, MD;  Location: [facility];  Service: Urology;  Laterality: Right;  . HOLMIUM LASER APPLICATION Left 11/17/2017   Procedure: HOLMIUM LASER APPLICATION;  Surgeon: Ottelin, Mark, MD;  [facility];  Service: Urology;  Laterality: Left;  . IR FLUORO GUIDE PORT INSERTION RIGHT  02/24/2017  . IR NEPHROSTOMY PLACEMENT RIGHT  11/17/2017  . IR PATIENT EVAL TECH 0-60 MINS  03/11/2017  . IR REMOVAL TUN ACCESS W/ PORT W/O FL MOD SED  04/20/2018  . IR US GUIDE VASC  ACCESS RIGHT  02/24/2017  . KNEE ARTHROSCOPY     bilateral  . LAPAROSCOPIC GASTRIC BANDING  07/10/2010  . LAPAROSCOPIC GASTRIC BANDING     Laparoscopic adjustable banding APS System with posterior hiatal hernia, 2 suture.  . LAPAROTOMY     for ruptured ovary and ovarian artery   . NEPHROLITHOTOMY Right 11/17/2017   Procedure: NEPHROLITHOTOMY PERCUTANEOUS;  Surgeon: Ottelin, Mark, MD;  [facility];  Service: Urology;  Laterality: Right;  . PACEMAKER INSERTION  03/10/2013   MDT dual chamber PPM  . POCKET REVISION N/A 12/08/2013   Procedure: POCKET REVISION;  Surgeon: Steven C Klein, MD;  [facility];  Service: Cardiovascular;  Laterality: N/A;  . PORTA CATH INSERTION    .   REVERSE SHOULDER ARTHROPLASTY Right 05/14/2018   Procedure: RIGHT REVERSE SHOULDER ARTHROPLASTY;  Surgeon: Tania Ade, MD;  Location: [facility];  Service: Orthopedics;  Laterality: Right;  . REVERSE SHOULDER REPLACEMENT Right 05/14/2018  . RIGHT HEART CATH N/A 07/21/2019   Procedure: RIGHT HEART CATH;  Surgeon: Larey Dresser, MD;  Location: [place] CV LAB;  Service: Cardiovascular;  Laterality: N/A;  . TEE WITH CARDIOVERSION  09/22/2017  . TEE WITHOUT CARDIOVERSION N/A 10/03/2017   Procedure: TRANSESOPHAGEAL ECHOCARDIOGRAM (TEE);  Surgeon: Sanda Klein, MD;  Location: [facility];  Service: Cardiovascular;  Laterality: N/A;  . TEE WITHOUT CARDIOVERSION N/A 08/04/2019   Procedure: TRANSESOPHAGEAL ECHOCARDIOGRAM (TEE);  Surgeon: Larey Dresser, MD;  Location: [facility];  Service: Cardiovascular;  Laterality: N/A;  . TEE WITHOUT CARDIOVERSION N/A 12/10/2019   Procedure: TRANSESOPHAGEAL ECHOCARDIOGRAM (TEE);  Surgeon: Buford Dresser, MD;  Location: [facility] END[facility]COPY;  Service: Cardiovascular;  Laterality: N/A;  . THYROIDECTOMY  1998   Dr Margot Chimes  . TONSILLECTOMY    . TOTAL KNEE ARTHROPLASTY  04/13/2012   Procedure: TOTAL KNEE ARTHROPLASTY;  Surgeon: Rudean Haskell, MD;  Location: [place];  Service:  Orthopedics;  Laterality: Right;  Marland Kitchen VIDEO BRONCHOSCOPY WITH ENDOBRONCHIAL ULTRASOUND N/A 02/07/2017   Procedure: VIDEO BRONCHOSCOPY WITH ENDOBRONCHIAL ULTRASOUND;  Surgeon: Marshell Garfinkel, MD;  Location: [facility];  Service: Pulmonary;  Laterality: N/A;    There were no vitals filed for this visit.  Subjective Assessment - 01/05/20 1202    Subjective  No new complaints.    Currently in Pain?  Yes    Pain Score  4     Pain Location  Shoulder    Pain Orientation  Right;Posterior    Pain Descriptors / Indicators  Sore    Pain Type  Acute pain    Pain Onset  1 to 4 weeks ago    Pain Frequency  Intermittent                       [facility] Adult PT Treatment/Exercise - 01/05/20 1040      Ambulation/Gait   Gait Comments  59f x 8 SPC with L/R head turns.       Knee/Hip Exercises: Standing   Hip Flexion  20 reps;Knee bent    Stairs  up/down 5 steps, 2 hand rails. reciprocol, x4,      Other Standing Knee Exercises  Corner balance: L/R and A/P weight shifts x30 each;  Tandem stance 30 sec x1 bil; DLS with L/R head turns x20;        Knee/Hip Exercises: Seated   Long Arc Quad  20 reps;Both    Long Arc Quad Weight  2 lbs.      Knee/Hip Exercises: Supine   Other Supine Knee/Hip Exercises  Clam GTB x20;       Knee/Hip Exercises: Sidelying   Hip ABduction  15 reps    Hip ABduction Limitations  diffiuculty on L                PT Short Term Goals - 12/10/19 1503      PT SHORT TERM GOAL #1   Title  Pt to be independent with initial HEP for LE strength    Time  2    Period  Weeks    Status  New    Target Date  12/22/19      PT SHORT TERM GOAL #2   Title  Pt with be compliant with safe use of [employer]  at all times, for improved safety    Time  2    Period  Weeks    Status  New    Target Date  12/22/19        PT Long Term Goals - 12/10/19 1504      PT LONG TERM GOAL #1   Title  Pt to be independent with final HEP, for strength and balance    Time  6    Period   Weeks    Status  New    Target Date  01/19/20      PT LONG TERM GOAL #2   Title  Pt to demo improved score on BERG by at least 5 points, to improve balance and safety    Time  6    Period  Weeks    Status  New    Target Date  01/19/20      PT LONG TERM GOAL #3   Title  Pt to demo improved LE strength to at least 4+/5 to improve stability and endurance.    Time  6    Period  Weeks    Status  New    Target Date  01/19/20      PT LONG TERM GOAL #4   Title  Pt to demo ability for ambulation with LRAD, for at least 500 ft, with direction changes and dynamic stability to be [facility] for pt age.    Time  6    Period  Weeks    Status  New    Target Date  01/19/20            Plan - 01/05/20 1203    Clinical Impression Statement  Pt has been able to progress balance exercises, does have moderate sway, and mild lob at times, but overall reports no falls at home. She has shown improvments, but continues to have variability and decreased safety with dynamic activitiy.  Most challenged with posterior weight shifts, stairs, and hip strength. Pt to benefit from continued care.    Personal Factors and Comorbidities  Comorbidity 1;Comorbidity 2    Comorbidities  A-Fib, dizziness, frequent falls, OA, Neuropathy    Examination-Activity Limitations  Bathing;Dressing;Hygiene/Grooming;Bend;Lift;Squat;Stairs;Locomotion Level    Examination-Participation Restrictions  Laundry;Cleaning;Shop;Community Activity;Meal Prep    Rehab Potential  Good    PT Frequency  2x / week    PT Duration  6 weeks    PT Treatment/Interventions  ADLs/Self Care Home Management;DME Instruction;Gait training;Stair training;Functional mobility training;Therapeutic activities;Therapeutic exercise;Balance training;Neuromuscular re-education;Patient/family education;Orthotic Fit/Training;Scar mobilization;Passive range of motion;Dry needling;Vestibular;Manual techniques;Canalith Repostioning;Cryotherapy;Electrical  Stimulation;Iontophoresis 32m/ml Dexamethasone;Moist Heat    Consulted and Agree with Plan of Care  Patient       Patient will benefit from skilled therapeutic intervention in order to improve the following deficits and impairments:  Abnormal gait, Decreased balance, Decreased mobility, Difficulty walking, Dizziness, Decreased knowledge of precautions, Cardiopulmonary status limiting activity, Decreased activity tolerance, Decreased coordination, Decreased safety awareness, Decreased strength, Pain  Visit Diagnosis: Other abnormalities of gait and mobility     Problem List Patient Active Problem List   Diagnosis Date Noted  . ICH (intracerebral hemorrhage) ([place] 12/02/2019  . A-fib ([place] 12/02/2019  . Anemia 12/02/2019  . QT prolongation 12/02/2019  . Hypothyroidism 12/02/2019  . Gait abnormality 07/10/2018  . S/P reverse total shoulder arthroplasty, 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  . Persistent atrial fibrillation ([place]   . Bilateral ureteral calculi 11/17/2017  . Atrial fibrillation with RVR ([place] 09/15/2017  . Atrial flutter ([place] 07/17/2017  . Port  catheter in place 04/02/2017  . Non-Hodgkin lymphoma, unspecified, intrathoracic lymph nodes ([place]) 02/13/2017  . Mediastinal mass 02/06/2017  . 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 Abnormal chest x-ray   . Lung mass 02/03/2017  . 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  . OSA (obstructive sleep apnea) 02/03/2015  . History of renal calculi 11/16/2014  . Renal calculi 11/16/2014  . Family history of colon cancer 10/26/2014  . Mechanical complication due to cardiac pacemaker pulse generator 11/06/2013  . Dyspnea 05/12/2013  . Hypothyroidism 04/21/2013  . Pleural effusion 04/19/2013  . Long term (current) use of anticoagulants 12/20/2010  . EPIDERMOID CYST 08/22/2010  . Chronic diastolic heart failure ([place]) 08/06/2010  . SYNCOPE AND COLLAPSE 07/27/2010  . OSTEOARTHRITIS, KNEE, RIGHT 03/15/2010  . Morbid obesity ([place])  12/07/2009  . NONSPEC ELEVATION OF LEVELS OF TRANSAMINASE/LDH 09/08/2009  . BREAST CANCER, HX OF 07/25/2009  . COLONIC POLYPS, HX OF 07/25/2009  . TUBULOVILLOUS ADENOMA, COLON 04/29/2008  . HYPERGLYCEMIA, FASTING 04/29/2008  . HYPERLIPIDEMIA 06/22/2007  . CARCINOMA, THYROID GLAND, HX OF 06/22/2007    Lyndee Hensen, PT, DPT 12:06 PM  01/05/20    [facility] [place], [place], [address] Phone: [phone]   Fax:  [phone]  Name: Kayla Harrison MRN: [id number] Date of Birth: 01/20/1946

## 2020-01-06 ENCOUNTER — Other Ambulatory Visit: Payer: Self-pay | Admitting: Endocrinology

## 2020-01-06 ENCOUNTER — Encounter (HOSPITAL_COMMUNITY): Payer: Medicare Other

## 2020-01-06 DIAGNOSIS — R928 Other abnormal and inconclusive findings on diagnostic imaging of breast: Secondary | ICD-10-CM

## 2020-01-07 MED ORDER — AMIODARONE HCL 200 MG PO TABS: ORAL_TABLET | ORAL | 0 refills | Status: DC

## 2020-01-07 NOTE — Telephone Encounter (Signed)
That was to be a loading dose  Now 400 daily x 2 weeks then 200 mg daily

## 2020-01-10 ENCOUNTER — Other Ambulatory Visit: Payer: Self-pay

## 2020-01-10 NOTE — Telephone Encounter (Signed)
New Rx orders for Amiodarone placed on 01/07/2020 and sent to pharmacy.

## 2020-01-10 NOTE — Telephone Encounter (Signed)
Spoke with pt and advised Amidarone Rx was sent to [facility] Friday 01/07/2020 and receipt was confirmed.  Pt was notified through [employer] message but states she did not receive.  Pt also advised Fabio Asa, RN with device clinic responded to her message on 12/24/2019 re: pacemaker and alert system stating it was ok to use with her pacer. Pt states she never received that message either.  Advised pt to check with pharmacy to confirm medication has been shipped and if not to please notify the office as she states she will be out of Amiodarone tomorrow.  Pt verbalizes understanding and agrees with current plan.

## 2020-01-11 ENCOUNTER — Encounter (HOSPITAL_COMMUNITY): Payer: Medicare Other

## 2020-01-11 ENCOUNTER — Other Ambulatory Visit: Payer: Self-pay

## 2020-01-11 ENCOUNTER — Encounter: Payer: Self-pay | Admitting: Physical Therapy

## 2020-01-11 ENCOUNTER — Ambulatory Visit (INDEPENDENT_AMBULATORY_CARE_PROVIDER_SITE_OTHER): Payer: Medicare Other | Admitting: Physical Therapy

## 2020-01-11 DIAGNOSIS — R2689 Other abnormalities of gait and mobility: Secondary | ICD-10-CM | POA: Diagnosis not present

## 2020-01-11 MED ORDER — AMIODARONE HCL 200 MG PO TABS: ORAL_TABLET | ORAL | 0 refills | Status: DC

## 2020-01-11 NOTE — Telephone Encounter (Signed)
Kayla Harrison, please follow-up on this.  Advised the pt to reach out to her Oral Surgeon's office and have them fax [place] a clearance form, so that we can clear her for needed oral surgery.  This is per [facility] cardiac clearance protocol. Just want to keep you in the loop, incase form makes it back to you. Thanks!

## 2020-01-11 NOTE — Therapy (Signed)
[facility] [address] [place], [place], [address] Phone: [phone]   Fax:  [phone]  Physical Therapy Treatment  Patient Details  Name: Kayla Harrison MRN: [id number] Date of Birth: Aug 31, 1946 Referring Provider (PT): Wendee Beavers   Encounter Date: 01/11/2020  PT End of Session - 01/11/20 1301    Visit Number  10    Number of Visits  12    Date for PT Re-Evaluation  01/19/20    PT Start Time  1020    PT Stop Time  1105    PT Time Calculation (min)  45 min    Equipment Utilized During Treatment  Gait belt    Activity Tolerance  Patient tolerated treatment well    Behavior During Therapy  [facility] for tasks assessed/performed       Past Medical History:  Diagnosis Date  . Anemia   . Arthritis    osteoarthritis - knees and right shoulder  . Atrial fibrillation [facility])    ablation- 2x's-- 1st time- [facility], 2nd event at [facility] in 2008. Convergent ablation at [facility] 6/14  . Atrial fibrillation ([place])   . Blood transfusion without reported diagnosis   . Breast cancer ([place])    Dr Margot Chimes, total thyroidectomy- 1999- for cancer  . 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 Chronic bilateral pleural effusions   . Colon polyp    Dr Earlean Shawl  . Complete heart block ([place])   . Complication of anesthesia    Ketamine produces LSD reaction, bright colored nightmarish experience   . Dyslipidemia   . Dyspnea   . Endometriosis   . Fibroids   . H/O pleural effusion    s/p thoracentesis w 3216m withdrawn  . Hepatitis    [place]hepatitis   . History of dysphagia    due to radiation therapy  . History of hiatal hernia    small noted on PET scan  . History of kidney stones   . Hx of thyroid cancer    Dr SForde Dandy . Hyperlipidemia   . Hypertension   . Hypothyroidism   . Lung cancer, [place] lobe ([place] 01[place]   radiation RX completed 03/04/17; will start chemo 6/27, pt unaware of lung cancer  . Morbid obesity ([place]    Status post lap band surgery  . Nephrolithiasis   . Non Hodgkin's lymphoma ([place]    on chemotherapy  . Persistent atrial fibrillation ([place]    a. s/p PVI 2008 b. s/p convergent ablation 21287complicated by bradycardia requiring pacemaker implant  . Personal history of radiation therapy   . Presence of permanent cardiac pacemaker   . Rotator cuff tear    Right  . Sinus node dysfunction ([place]    Complicating convergent ablation 6/14  . [place] ([facility]    [place]  . SVC syndrome    with lung mass and non hodgkins lymphoma  . Thyroid cancer ([place] 2000    Past Surgical History:  Procedure Laterality Date  . ABDOMINAL HYSTERECTOMY  1983  . afib ablation     a. 2008 PVI b. 2014 convergent ablation  . APPENDECTOMY    . BONE MARROW BIOPSY  02/21/2017  . BREAST LUMPECTOMY Left 2010  . bso  1998  . CARDIAC CATHETERIZATION     2015- negative  . CARDIOVERSION  10/09/2012   Procedure: CARDIOVERSION;  Surgeon: JMinus Breeding MD;  [place]  Service: Cardiovascular;  Laterality: N/A;  . CARDIOVERSION  10/09/2012  Procedure: CARDIOVERSION;  Surgeon: Minus Breeding, MD;  Location: [facility] ENDOSCOPY;  Service: Cardiovascular;  Laterality: N/A;  [name] gave the ok to add pt to the add on , but we must check to find out if the can add pt on at 1400 ( [phone])  . CARDIOVERSION N/A 11/20/2012   Procedure: CARDIOVERSION;  Surgeon: Fay Records, MD;  Location: [place];  Service: Cardiovascular;  Laterality: N/A;  . CARDIOVERSION N/A 07/18/2017   Procedure: CARDIOVERSION;  Surgeon: Thayer Headings, MD;  Location: [facility] ENDOSCOPY;  Service: Cardiovascular;  Laterality: N/A;  . CARDIOVERSION N/A 10/03/2017   Procedure: CARDIOVERSION;  Surgeon: Sanda Klein, MD;  Location: [facility] ENDOSCOPY;  Service: Cardiovascular;  Laterality: N/A;  . CARDIOVERSION N/A 01/07/2018   Procedure: CARDIOVERSION;  Surgeon: Thayer Headings, MD;  Location: [facility] ENDOSCOPY;  Service: Cardiovascular;  Laterality: N/A;  .  CARDIOVERSION N/A 12/10/2019   Procedure: CARDIOVERSION;  Surgeon: Buford Dresser, MD;  Location: [facility] ENDOSCOPY;  Service: Cardiovascular;  Laterality: N/A;  . CHOLECYSTECTOMY    . COLONOSCOPY W/ POLYPECTOMY     Dr Earlean Shawl  . CYSTOSCOPY N/A 02/06/2015   Procedure: CYSTOSCOPY;  Surgeon: Kathie Rhodes, MD;  Location: [facility] ORS;  Service: Urology;  Laterality: N/A;  . CYSTOSCOPY W/ RETROGRADES Left 11/17/2017   Procedure: CYSTOSCOPY WITH RETROGRADE /PYELOGRAM/;  Surgeon: Kathie Rhodes, MD;  Location: [facility];  Service: Urology;  Laterality: Left;  . CYSTOSCOPY WITH RETROGRADE PYELOGRAM, URETEROSCOPY AND STENT PLACEMENT Right 02/06/2015   Procedure: RETROGRADE PYELOGRAM, RIGHT URETEROSCOPY STENT PLACEMENT;  Surgeon: Kathie Rhodes, MD;  [facility]: [facility];  Service: Urology;  Laterality: Right;  . CYSTOSCOPY WITH RETROGRADE PYELOGRAM, URETEROSCOPY AND STENT PLACEMENT Right 03/07/2017   Procedure: CYSTOSCOPY WITH RIGHT RETROGRADE PYELOGRAM,RIGHT  URETEROSCOPYLASER LITHOTRIPSY  AND STENT PLACEMENT AND STONE BASKETRY;  Surgeon: Kathie Rhodes, MD;  Location: [place];  Service: Urology;  Laterality: Right;  . EYE SURGERY     cataract surgery  . fatty mass removal  1999   pubic area  . HOLMIUM LASER APPLICATION N/A 1/74/0814   Procedure: HOLMIUM LASER APPLICATION;  Surgeon: Kathie Rhodes, MD;  [facility]: [facility];  Service: Urology;  Laterality: N/A;  . HOLMIUM LASER APPLICATION Right 4/81/8563   Procedure: HOLMIUM LASER APPLICATION;  Surgeon: Kathie Rhodes, MD;  [facility];  Service: Urology;  Laterality: Right;  . HOLMIUM LASER APPLICATION Left 1/49/7026   Procedure: HOLMIUM LASER APPLICATION;  Surgeon: Kathie Rhodes, MD;  Location: [facility];  Service: Urology;  Laterality: Left;  . IR FLUORO GUIDE PORT INSERTION RIGHT  02/24/2017  . IR NEPHROSTOMY PLACEMENT RIGHT  11/17/2017  . IR PATIENT EVAL TECH 0-60 MINS  03/11/2017  . IR REMOVAL TUN ACCESS W/ PORT W/O FL MOD SED   04/20/2018  . IR US GUIDE VASC ACCESS RIGHT  02/24/2017  . KNEE ARTHROSCOPY     bilateral  . LAPAROSCOPIC GASTRIC BANDING  07/10/2010  . LAPAROSCOPIC GASTRIC BANDING     Laparoscopic adjustable banding APS System with posterior hiatal hernia, 2 suture.  Marland Kitchen LAPAROTOMY     for ruptured ovary and ovarian artery   . NEPHROLITHOTOMY Right 11/17/2017   Procedure: NEPHROLITHOTOMY PERCUTANEOUS;  Surgeon: Kathie Rhodes, MD;  [facility];  Service: Urology;  Laterality: Right;  . PACEMAKER INSERTION  03/10/2013   MDT dual chamber PPM  . POCKET REVISION N/A 12/08/2013   Procedure: POCKET REVISION;  Surgeon: Deboraha Sprang, MD;  Location: [facility] CATH LAB;  Service: Cardiovascular;  Laterality: N/A;  .  PORTA CATH INSERTION    . REVERSE SHOULDER ARTHROPLASTY Right 05/14/2018   Procedure: RIGHT REVERSE SHOULDER ARTHROPLASTY;  Surgeon: Tania Ade, MD;  Location: [place];  Service: Orthopedics;  Laterality: Right;  . REVERSE SHOULDER REPLACEMENT Right 05/14/2018  . RIGHT HEART CATH N/A 07/21/2019   Procedure: RIGHT HEART CATH;  Surgeon: Larey Dresser, MD;  Location: [place] CV LAB;  Service: Cardiovascular;  Laterality: N/A;  . TEE WITH CARDIOVERSION  09/22/2017  . TEE WITHOUT CARDIOVERSION N/A 10/03/2017   Procedure: TRANSESOPHAGEAL ECHOCARDIOGRAM (TEE);  Surgeon: Sanda Klein, MD;  Location: [facility] ENDOSCOPY;  Service: Cardiovascular;  Laterality: N/A;  . TEE WITHOUT CARDIOVERSION N/A 08/04/2019   Procedure: TRANSESOPHAGEAL ECHOCARDIOGRAM (TEE);  Surgeon: Larey Dresser, MD;  Location: [facility] ENDOSCOPY;  Service: Cardiovascular;  Laterality: N/A;  . TEE WITHOUT CARDIOVERSION N/A 12/10/2019   Procedure: TRANSESOPHAGEAL ECHOCARDIOGRAM (TEE);  Surgeon: Buford Dresser, MD;  Location: [facility] ENDOSCOPY;  Service: Cardiovascular;  Laterality: N/A;  . THYROIDECTOMY  1998   Dr Margot Chimes  . TONSILLECTOMY    . TOTAL KNEE ARTHROPLASTY  04/13/2012   Procedure: TOTAL KNEE ARTHROPLASTY;  Surgeon: Rudean Haskell,  MD;  Location: [place];  Service: Orthopedics;  Laterality: Right;  Marland Kitchen VIDEO BRONCHOSCOPY WITH ENDOBRONCHIAL ULTRASOUND N/A 02/07/2017   Procedure: VIDEO BRONCHOSCOPY WITH ENDOBRONCHIAL ULTRASOUND;  Surgeon: Marshell Garfinkel, MD;  Location: [place];  Service: Pulmonary;  Laterality: N/A;    There were no vitals filed for this visit.  Subjective Assessment - 01/11/20 1301    Subjective  Pt obtained new shoes. Will wear today during session to assess fit , comfort and safety.    Currently in Pain?  Yes    Pain Score  4     Pain Location  Shoulder    Pain Orientation  Right;Posterior    Pain Descriptors / Indicators  Sore;Spasm    Pain Type  Acute pain    Pain Onset  1 to 4 weeks ago    Pain Frequency  Intermittent                       [facility] Adult PT Treatment/Exercise - 01/11/20 0001      Ambulation/Gait   Gait Comments  20f x 8 SPC ; Curb navigation, outdoors, with SPC, x10 with education on safety and mechanics.       Knee/Hip Exercises: Standing   Hip Flexion  20 reps;Knee bent    Hip Abduction  2 sets;10 reps    Stairs  up/down 5 steps, 1 hand rail, SPC. reciprocol, x4,      Other Standing Knee Exercises  Corner balance: L/R and A/P weight shifts x30 each;  Tandem stance 30 sec x1 bil; DLS with L/R head turns x20;      Other Standing Knee Exercises  Posterior stepping with weight shifts x20;       Knee/Hip Exercises: Seated   Long Arc Quad  20 reps;Both    Long Arc Quad Weight  2 lbs.    Sit to Sand  without UE support;10 reps               PT Short Term Goals - 01/11/20 1302      PT SHORT TERM GOAL #1   Title  Pt to be independent with initial HEP for LE strength    Time  2    Period  Weeks    Status  Achieved    Target Date  12/22/19  PT SHORT TERM GOAL #2   Title  Pt with be compliant with safe use of 4 WW at all times, for improved safety    Time  2    Period  Weeks    Status  Achieved    Target Date  12/22/19        PT Long Term  Goals - 12/10/19 1504      PT LONG TERM GOAL #1   Title  Pt to be independent with final HEP, for strength and balance    Time  6    Period  Weeks    Status  New    Target Date  01/19/20      PT LONG TERM GOAL #2   Title  Pt to demo improved score on BERG by at least 5 points, to improve balance and safety    Time  6    Period  Weeks    Status  New    Target Date  01/19/20      PT LONG TERM GOAL #3   Title  Pt to demo improved LE strength to at least 4+/5 to improve stability and endurance.    Time  6    Period  Weeks    Status  New    Target Date  01/19/20      PT LONG TERM GOAL #4   Title  Pt to demo ability for ambulation with LRAD, for at least 500 ft, with direction changes and dynamic stability to be [facility] for pt age.    Time  6    Period  Weeks    Status  New    Target Date  01/19/20            Plan - 01/11/20 1304    Clinical Impression Statement  Shoes with good fit and comfort today. No increased LOB today with shoes. They do have heel out (for heel pain) that could effect balance, and posterior weight shift, but pt appears to be safe with shoes today. Discussed returning if she feels they are increasing her risk of falling backwards. Pt showing improvments today with gait with SPC, ability and safety with curbs and stairs, as well as LE strength. Pt progressing well.    Personal Factors and Comorbidities  Comorbidity 1;Comorbidity 2    Comorbidities  A-Fib, dizziness, frequent falls, OA, Neuropathy    Examination-Activity Limitations  Bathing;Dressing;Hygiene/Grooming;Bend;Lift;Squat;Stairs;Locomotion Level    Examination-Participation Restrictions  Laundry;Cleaning;Shop;Community Activity;Meal Prep    Rehab Potential  Good    PT Frequency  2x / week    PT Duration  6 weeks    PT Treatment/Interventions  ADLs/Self Care Home Management;DME Instruction;Gait training;Stair training;Functional mobility training;Therapeutic activities;Therapeutic exercise;Balance  training;Neuromuscular re-education;Patient/family education;Orthotic Fit/Training;Scar mobilization;Passive range of motion;Dry needling;Vestibular;Manual techniques;Canalith Repostioning;Cryotherapy;Electrical Stimulation;Iontophoresis '4mg'$ /ml Dexamethasone;Moist Heat    Consulted and Agree with Plan of Care  Patient       Patient will benefit from skilled therapeutic intervention in order to improve the following deficits and impairments:  Abnormal gait, Decreased balance, Decreased mobility, Difficulty walking, Dizziness, Decreased knowledge of precautions, Cardiopulmonary status limiting activity, Decreased activity tolerance, Decreased coordination, Decreased safety awareness, Decreased strength, Pain  Visit Diagnosis: Other abnormalities of gait and mobility     Problem List Patient Active Problem List   Diagnosis Date Noted  . ICH (intracerebral hemorrhage) ([place]) 12/02/2019  . A-fib ([place]) 12/02/2019  . Anemia 12/02/2019  . QT prolongation 12/02/2019  . Hypothyroidism 12/02/2019  . Gait abnormality 07/10/2018  .  S/P reverse total shoulder arthroplasty, 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  . Persistent atrial fibrillation ([place])   . Bilateral ureteral calculi 11/17/2017  . Atrial fibrillation with RVR ([place]) 09/15/2017  . Atrial flutter ([place]) 07/17/2017  . Port catheter in place 04/02/2017  . Non-Hodgkin lymphoma, unspecified, intrathoracic lymph nodes ([place]) 02/13/2017  . Mediastinal mass 02/06/2017  . 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 Abnormal chest x-ray   . Lung mass 02/03/2017  . 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  . OSA (obstructive sleep apnea) 02/03/2015  . History of renal calculi 11/16/2014  . Renal calculi 11/16/2014  . Family history of colon cancer 10/26/2014  . Mechanical complication due to cardiac pacemaker pulse generator 11/06/2013  . Dyspnea 05/12/2013  . Hypothyroidism 04/21/2013  . Pleural effusion 04/19/2013  . Long term (current) use of  anticoagulants 12/20/2010  . EPIDERMOID CYST 08/22/2010  . Chronic diastolic heart failure ([place]) 08/06/2010  . SYNCOPE AND COLLAPSE 07/27/2010  . OSTEOARTHRITIS, KNEE, RIGHT 03/15/2010  . Morbid obesity ([place]) 12/07/2009  . NONSPEC ELEVATION OF LEVELS OF TRANSAMINASE/LDH 09/08/2009  . BREAST CANCER, HX OF 07/25/2009  . COLONIC POLYPS, HX OF 07/25/2009  . TUBULOVILLOUS ADENOMA, COLON 04/29/2008  . HYPERGLYCEMIA, FASTING 04/29/2008  . HYPERLIPIDEMIA 06/22/2007  . CARCINOMA, THYROID GLAND, HX OF 06/22/2007   Lyndee Hensen, PT, DPT 1:09 PM  01/11/20    [place], [place], [address] Phone: [phone]   Fax:  [phone]  Name: Kayla Harrison MRN: [id number] Date of Birth: 10/12/1945

## 2020-01-12 NOTE — Telephone Encounter (Signed)
Good morning Kayla Harrison,  I have not seen anything come through pre op for this pt. She will need to let the requesting office know that they need to send over a clearance request to our office fax # [phone]. We will need to know actual procedure, if teeth being extracted then how many, type of anesthesia if any being used, meds to be held, surgeon doing procedure, fax # to fax clearance.   Thank you Arbie Cookey

## 2020-01-13 ENCOUNTER — Ambulatory Visit
Admission: RE | Admit: 2020-01-13 | Discharge: 2020-01-13 | Disposition: A | Discharge status: Home / Self Care | Payer: Medicare Other | Source: Ambulatory Visit | Attending: Endocrinology | Admitting: Endocrinology

## 2020-01-13 ENCOUNTER — Other Ambulatory Visit: Payer: Self-pay | Admitting: Endocrinology

## 2020-01-13 ENCOUNTER — Encounter: Payer: Self-pay | Admitting: Physical Therapy

## 2020-01-13 ENCOUNTER — Other Ambulatory Visit: Payer: Self-pay

## 2020-01-13 ENCOUNTER — Ambulatory Visit (INDEPENDENT_AMBULATORY_CARE_PROVIDER_SITE_OTHER): Payer: Medicare Other | Admitting: Physical Therapy

## 2020-01-13 DIAGNOSIS — R2689 Other abnormalities of gait and mobility: Secondary | ICD-10-CM

## 2020-01-13 DIAGNOSIS — R921 Mammographic calcification found on diagnostic imaging of breast: Secondary | ICD-10-CM | POA: Diagnosis not present

## 2020-01-13 DIAGNOSIS — R928 Other abnormal and inconclusive findings on diagnostic imaging of breast: Secondary | ICD-10-CM

## 2020-01-13 NOTE — Therapy (Signed)
[facility] [address] [place], [place], [address] Phone: [phone]   Fax:  [phone]  Physical Therapy Treatment/Re-Cert/ Progress Note    Progress note Reporting period: 12/08/19 -   01/13/20   Patient Details  Name: Kayla Harrison MRN: [id number] Date of Birth: Feb 09, 1946 Referring Provider (PT): Wendee Beavers   Encounter Date: 01/13/2020  PT End of Session - 01/13/20 1329    Visit Number  11    Number of Visits  12    Date for PT Re-Evaluation  02/10/20    PT Start Time  1217    PT Stop Time  1302    PT Time Calculation (min)  45 min    Equipment Utilized During Treatment  Gait belt    Activity Tolerance  Patient tolerated treatment well    Behavior During Therapy  [facility] for tasks assessed/performed       Past Medical History:  Diagnosis Date  . Anemia   . Arthritis    osteoarthritis - knees and right shoulder  . Atrial fibrillation [facility])    ablation- 2x's-- 1st time- [facility], 2nd event at [facility] in 2008. Convergent ablation at [facility] 6/14  . Atrial fibrillation ([place])   . Blood transfusion without reported diagnosis   . Breast cancer ([place])    Dr Margot Chimes, total thyroidectomy- 1999- for cancer  . 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 Chronic bilateral pleural effusions   . Colon polyp    Dr Earlean Shawl  . Complete heart block ([place])   . Complication of anesthesia    Ketamine produces LSD reaction, bright colored nightmarish experience   . Dyslipidemia   . Dyspnea   . Endometriosis   . Fibroids   . H/O pleural effusion    s/p thoracentesis w 3239m withdrawn  . Hepatitis    [place] ?hepatitis   . History of dysphagia    due to radiation therapy  . History of hiatal hernia    small noted on PET scan  . History of kidney stones   . Hx of thyroid cancer    Dr SForde Dandy . Hyperlipidemia   . Hypertension   . Hypothyroidism   . Lung cancer, [place] lobe [place] 01[place]   radiation RX completed 03/04/17;  will start chemo 6/27, pt unaware of lung cancer  . Morbid obesity ([place]    Status post lap band surgery  . Nephrolithiasis   . Non Hodgkin's lymphoma ([place]    on chemotherapy  . Persistent atrial fibrillation ([place]    a. s/p PVI 2008 b. s/p convergent ablation 21062complicated by bradycardia requiring pacemaker implant  . Personal history of radiation therapy   . Presence of permanent cardiac pacemaker   . Rotator cuff tear    Right  . Sinus node dysfunction ([place]    Complicating convergent ablation 6/14  . St[place] ([facility]    [place]  . SVC syndrome    with lung mass and non hodgkins lymphoma  . Thyroid cancer [place]    Past Surgical History:  Procedure Laterality Date  . ABDOMINAL HYSTERECTOMY  1983  . afib ablation     a. 2008 PVI b. 2014 convergent ablation  . APPENDECTOMY    . BONE MARROW BIOPSY  02/21/2017  . BREAST LUMPECTOMY Left 2010  . bso  1998  . CARDIAC CATHETERIZATION     2015- negative  . CARDIOVERSION  10/09/2012   Procedure: CARDIOVERSION;  Surgeon: JMinus Breeding  MD;  Location: [place];  Service: Cardiovascular;  Laterality: N/A;  . CARDIOVERSION  10/09/2012   Procedure: CARDIOVERSION;  Surgeon: Minus Breeding, MD;  Location: [facility] ENDOSCOPY;  Service: Cardiovascular;  Laterality: N/A;  [name] gave the ok to add pt to the add on , but we must check to find out if the can add pt on at [phone] ( [phone])  . CARDIOVERSION N/A 11/20/2012   Procedure: CARDIOVERSION;  Surgeon: Fay Records, MD;  Location: [place];  Service: Cardiovascular;  Laterality: N/A;  . CARDIOVERSION N/A 07/18/2017   Procedure: CARDIOVERSION;  Surgeon: Thayer Headings, MD;  Location: [facility] ENDOSCOPY;  Service: Cardiovascular;  Laterality: N/A;  . CARDIOVERSION N/A 10/03/2017   Procedure: CARDIOVERSION;  Surgeon: Sanda Klein, MD;  Location: [facility] ENDOSCOPY;  Service: Cardiovascular;  Laterality: N/A;  . CARDIOVERSION N/A 01/07/2018   Procedure: CARDIOVERSION;  Surgeon: Thayer Headings, MD;   Location: [facility] ENDOSCOPY;  Service: Cardiovascular;  Laterality: N/A;  . CARDIOVERSION N/A 12/10/2019   Procedure: CARDIOVERSION;  Surgeon: Buford Dresser, MD;  Location: [facility] ENDOSCOPY;  Service: Cardiovascular;  Laterality: N/A;  . CHOLECYSTECTOMY    . COLONOSCOPY W/ POLYPECTOMY     Dr Earlean Shawl  . CYSTOSCOPY N/A 02/06/2015   Procedure: CYSTOSCOPY;  Surgeon: Kathie Rhodes, MD;  Location: [facility];  Service: Urology;  Laterality: N/A;  . CYSTOSCOPY W/ RETROGRADES Left 11/17/2017   Procedure: CYSTOSCOPY WITH RETROGRADE /PYELOGRAM/;  Surgeon: Kathie Rhodes, MD;  Location: [facility];  Service: Urology;  Laterality: Left;  . CYSTOSCOPY WITH RETROGRADE PYELOGRAM, URETEROSCOPY AND STENT PLACEMENT Right 02/06/2015   Procedure: RETROGRADE PYELOGRAM, RIGHT URETEROSCOPY STENT PLACEMENT;  Surgeon: Kathie Rhodes, MD;  Location: [facility];  Service: Urology;  Laterality: Right;  . CYSTOSCOPY WITH RETROGRADE PYELOGRAM, URETEROSCOPY AND STENT PLACEMENT Right 03/07/2017   Procedure: CYSTOSCOPY WITH RIGHT RETROGRADE PYELOGRAM,RIGHT  URETEROSCOPYLASER LITHOTRIPSY  AND STENT PLACEMENT AND STONE BASKETRY;  Surgeon: Kathie Rhodes, MD;  Location: [place];  Service: Urology;  Laterality: Right;  . EYE SURGERY     cataract surgery  . fatty mass removal  1999   pubic area  . HOLMIUM LASER APPLICATION N/A 9/62/8366   Procedure: HOLMIUM LASER APPLICATION;  Surgeon: Kathie Rhodes, MD;  [facility]: [facility];  Service: Urology;  Laterality: N/A;  . HOLMIUM LASER APPLICATION Right 2/94/7654   Procedure: HOLMIUM LASER APPLICATION;  Surgeon: Kathie Rhodes, MD;  Location[facility];  Service: Urology;  Laterality: Right;  . HOLMIUM LASER APPLICATION Left 6/50/3546   Procedure: HOLMIUM LASER APPLICATION;  Surgeon: Kathie Rhodes, MD;  Location: [facility];  Service: Urology;  Laterality: Left;  . IR FLUORO GUIDE PORT INSERTION RIGHT  02/24/2017  . IR NEPHROSTOMY PLACEMENT RIGHT  11/17/2017  . IR PATIENT EVAL TECH  0-60 MINS  03/11/2017  . IR REMOVAL TUN ACCESS W/ PORT W/O FL MOD SED  04/20/2018  . IR US GUIDE VASC ACCESS RIGHT  02/24/2017  . KNEE ARTHROSCOPY     bilateral  . LAPAROSCOPIC GASTRIC BANDING  07/10/2010  . LAPAROSCOPIC GASTRIC BANDING     Laparoscopic adjustable banding APS System with posterior hiatal hernia, 2 suture.  Marland Kitchen LAPAROTOMY     for ruptured ovary and ovarian artery   . NEPHROLITHOTOMY Right 11/17/2017   Procedure: NEPHROLITHOTOMY PERCUTANEOUS;  Surgeon: Kathie Rhodes, MD;  [facility]: [facility];  Service: Urology;  Laterality: Right;  . PACEMAKER INSERTION  03/10/2013   MDT dual chamber PPM  . POCKET REVISION N/A 12/08/2013   Procedure: POCKET REVISION;  Surgeon: Deboraha Sprang, MD;  Location[facility] CATH LAB;  Service: Cardiovascular;  Laterality: N/A;  . PORTA CATH INSERTION    . REVERSE SHOULDER ARTHROPLASTY Right 05/14/2018   Procedure: RIGHT REVERSE SHOULDER ARTHROPLASTY;  Surgeon: Tania Ade, MD;  Location: [place];  Service: Orthopedics;  Laterality: Right;  . REVERSE SHOULDER REPLACEMENT Right 05/14/2018  . RIGHT HEART CATH N/A 07/21/2019   Procedure: RIGHT HEART CATH;  Surgeon: Larey Dresser, MD;  Location: [place] CV LAB;  Service: Cardiovascular;  Laterality: N/A;  . TEE WITH CARDIOVERSION  09/22/2017  . TEE WITHOUT CARDIOVERSION N/A 10/03/2017   Procedure: TRANSESOPHAGEAL ECHOCARDIOGRAM (TEE);  Surgeon: Sanda Klein, MD;  Location: [facility] ENDOSCOPY;  Service: Cardiovascular;  Laterality: N/A;  . TEE WITHOUT CARDIOVERSION N/A 08/04/2019   Procedure: TRANSESOPHAGEAL ECHOCARDIOGRAM (TEE);  Surgeon: Larey Dresser, MD;  Location: [facility] ENDOSCOPY;  Service: Cardiovascular;  Laterality: N/A;  . TEE WITHOUT CARDIOVERSION N/A 12/10/2019   Procedure: TRANSESOPHAGEAL ECHOCARDIOGRAM (TEE);  Surgeon: Buford Dresser, MD;  Location: [facility] ENDOSCOPY;  Service: Cardiovascular;  Laterality: N/A;  . THYROIDECTOMY  1998   Dr Margot Chimes  . TONSILLECTOMY    . TOTAL KNEE ARTHROPLASTY   04/13/2012   Procedure: TOTAL KNEE ARTHROPLASTY;  Surgeon: Rudean Haskell, MD;  Location: [place];  Service: Orthopedics;  Laterality: Right;  Marland Kitchen VIDEO BRONCHOSCOPY WITH ENDOBRONCHIAL ULTRASOUND N/A 02/07/2017   Procedure: VIDEO BRONCHOSCOPY WITH ENDOBRONCHIAL ULTRASOUND;  Surgeon: Marshell Garfinkel, MD;  Location: [place];  Service: Pulmonary;  Laterality: N/A;    There were no vitals filed for this visit.  Subjective Assessment - 01/13/20 1329    Subjective  Pt with no new complaints.    Currently in Pain?  Yes    Pain Score  3     Pain Location  Shoulder    Pain Orientation  Right    Pain Descriptors / Indicators  Sore;Spasm    Pain Type  Acute pain    Pain Onset  1 to 4 weeks ago    Pain Frequency  Intermittent         [facility] PT Assessment - 01/13/20 1227      Strength   Overall Strength Comments  knee: 4+/5;  Hip flex/abd : 4-/5 on L, 4/5 on R;       Berg Balance Test   Sit to Stand  Able to stand without using hands and stabilize independently    Standing Unsupported  Able to stand safely 2 minutes    Sitting with Back Unsupported but Feet Supported on Floor or Stool  Able to sit safely and securely 2 minutes    Stand to Sit  Sits safely with minimal use of hands    Transfers  Able to transfer safely, minor use of hands    Standing Unsupported with Eyes Closed  Able to stand 10 seconds safely    Standing Unsupported with Feet Together  Needs help to attain position but able to stand for 30 seconds with feet together    From Standing, Reach Forward with Outstretched Arm  Can reach confidently >25 cm (10")    From Standing Position, Pick up Object from Floor  Able to pick up shoe, needs supervision    From Standing Position, Turn to Look Behind Over each Shoulder  Looks behind one side only/other side shows less weight shift    Turn 360 Degrees  Able to turn 360 degrees safely but slowly    Standing Unsupported, Alternately Place Feet on Step/Stool  Able  to stand independently and  complete 8 steps >20 seconds    Standing Unsupported, One Foot in Front  Needs help to step but can hold 15 seconds    Standing on One Leg  Unable to try or needs assist to prevent fall    Total Score  41      Timed Up and Go Test   TUG Comments  14.8 SPC                    [facility] Adult PT Treatment/Exercise - 01/13/20 1227      Ambulation/Gait   Gait Comments  27f x 10 no AD (CGA)       Knee/Hip Exercises: Aerobic   Recumbent Bike  L1 x 5 min      Knee/Hip Exercises: Standing   Hip Flexion  --    Hip Abduction  --    Stairs  up/down 5 steps, 1 hand rail, SPC. reciprocol, x4,      Other Standing Knee Exercises  Tandem stance 30 sec x 2 bil; Staggered stance weight shifts x 20 bil;     Other Standing Knee Exercises  --      Knee/Hip Exercises: Seated   Long Arc Quad  20 reps;Both    Long Arc Quad Weight  2 lbs.      Knee/Hip Exercises: Supine   Bridges  20 reps    Straight Leg Raises  2 sets;10 reps    Other Supine Knee/Hip Exercises  Supine march x25;       Knee/Hip Exercises: Sidelying   Hip ABduction  10 reps;Both               PT Short Term Goals - 01/13/20 1330      PT SHORT TERM GOAL #1   Title  Pt to be independent with initial HEP for LE strength    Time  2    Period  Weeks    Status  Achieved    Target Date  12/22/19      PT SHORT TERM GOAL #2   Title  Pt with be compliant with safe use of 4 WW at all times, for improved safety    Time  2    Period  Weeks    Status  Achieved    Target Date  12/22/19        PT Long Term Goals - 01/13/20 1331      PT LONG TERM GOAL #1   Title  Pt to be independent with final HEP, for strength and balance    Time  6    Period  Weeks    Status  On-going    Target Date  02/10/20      PT LONG TERM GOAL #2   Title  Pt to demo improved score on BERG by at least 5 points, to improve balance and safety    Time  6    Period  Weeks    Status  Partially Met    Target Date  02/10/20      PT LONG  TERM GOAL #3   Title  Pt to demo improved LE strength to at least 4+/5 to improve stability and endurance.    Time  6    Period  Weeks    Status  Partially Met    Target Date  02/10/20      PT LONG TERM GOAL #4   Title  Pt to demo ability for ambulation with  LRAD, for at least 500 ft, with direction changes and dynamic stability to be [facility] for pt age.    Time  6    Period  Weeks    Status  Partially Met    Target Date  02/10/20            Plan - 01/13/20 1332    Clinical Impression Statement  Pt showing improvments in ability and safety with gait. Able to use SPC safely for short distances. Practiced with no AD today, CGA requires, but no LOB. Pt improving with ability and safety with stair and curb navigation, as well as LE strength. Pt has partially met LTGs, and will benefit from continued care to fully meet LTGs, and to improve safety, gait, and fall risk.    Personal Factors and Comorbidities  Comorbidity 1;Comorbidity 2    Comorbidities  A-Fib, dizziness, frequent falls, OA, Neuropathy    Examination-Activity Limitations  Bathing;Dressing;Hygiene/Grooming;Bend;Lift;Squat;Stairs;Locomotion Level    Examination-Participation Restrictions  Laundry;Cleaning;Shop;Community Activity;Meal Prep    Rehab Potential  Good    PT Frequency  2x / week    PT Duration  6 weeks    PT Treatment/Interventions  ADLs/Self Care Home Management;DME Instruction;Gait training;Stair training;Functional mobility training;Therapeutic activities;Therapeutic exercise;Balance training;Neuromuscular re-education;Patient/family education;Orthotic Fit/Training;Scar mobilization;Passive range of motion;Dry needling;Vestibular;Manual techniques;Canalith Repostioning;Cryotherapy;Electrical Stimulation;Iontophoresis '4mg'$ /ml Dexamethasone;Moist Heat    Consulted and Agree with Plan of Care  Patient       Patient will benefit from skilled therapeutic intervention in order to improve the following deficits and  impairments:  Abnormal gait, Decreased balance, Decreased mobility, Difficulty walking, Dizziness, Decreased knowledge of precautions, Cardiopulmonary status limiting activity, Decreased activity tolerance, Decreased coordination, Decreased safety awareness, Decreased strength, Pain  Visit Diagnosis: Other abnormalities of gait and mobility     Problem List Patient Active Problem List   Diagnosis Date Noted  . ICH (intracerebral hemorrhage) ([place]) 12/02/2019  . A-fib ([place]) 12/02/2019  . Anemia 12/02/2019  . QT prolongation 12/02/2019  . Hypothyroidism 12/02/2019  . Gait abnormality 07/10/2018  . S/P reverse total shoulder arthroplasty, 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  . Persistent atrial fibrillation ([place])   . Bilateral ureteral calculi 11/17/2017  . Atrial fibrillation with RVR ([place]) 09/15/2017  . Atrial flutter ([place]) 07/17/2017  . Port catheter in place 04/02/2017  . Non-Hodgkin lymphoma, unspecified, intrathoracic lymph nodes ([place]) 02/13/2017  . Mediastinal mass 02/06/2017  . 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 Abnormal chest x-ray   . Lung mass 02/03/2017  . 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  . OSA (obstructive sleep apnea) 02/03/2015  . History of renal calculi 11/16/2014  . Renal calculi 11/16/2014  . Family history of colon cancer 10/26/2014  . Mechanical complication due to cardiac pacemaker pulse generator 11/06/2013  . Dyspnea 05/12/2013  . Hypothyroidism 04/21/2013  . Pleural effusion 04/19/2013  . Long term (current) use of anticoagulants 12/20/2010  . EPIDERMOID CYST 08/22/2010  . Chronic diastolic heart failure ([place]) 08/06/2010  . SYNCOPE AND COLLAPSE 07/27/2010  . OSTEOARTHRITIS, KNEE, RIGHT 03/15/2010  . Morbid obesity ([place]) 12/07/2009  . NONSPEC ELEVATION OF LEVELS OF TRANSAMINASE/LDH 09/08/2009  . BREAST CANCER, HX OF 07/25/2009  . COLONIC POLYPS, HX OF 07/25/2009  . TUBULOVILLOUS ADENOMA, COLON 04/29/2008  . HYPERGLYCEMIA, FASTING 04/29/2008  .  HYPERLIPIDEMIA 06/22/2007  . CARCINOMA, THYROID GLAND, HX OF 06/22/2007   Kayla Harrison, PT, DPT 1:41 PM  01/13/20   [place], [place], [address] Phone: [phone]   Fax:  [phone]  Name: Kayla Harrison MRN: [id number] Date of Birth: 03-01-1946

## 2020-01-17 ENCOUNTER — Ambulatory Visit (INDEPENDENT_AMBULATORY_CARE_PROVIDER_SITE_OTHER): Payer: Medicare Other | Admitting: Physical Therapy

## 2020-01-17 ENCOUNTER — Other Ambulatory Visit: Payer: Self-pay

## 2020-01-17 ENCOUNTER — Encounter: Payer: Self-pay | Admitting: Physical Therapy

## 2020-01-17 DIAGNOSIS — R2689 Other abnormalities of gait and mobility: Secondary | ICD-10-CM | POA: Diagnosis not present

## 2020-01-17 NOTE — Therapy (Signed)
[facility] [address] [place], [place], [address] Phone: [phone]   Fax:  [phone]  Physical Therapy Treatment  Patient Details  Name: Kayla Harrison MRN: [id number] Date of Birth: 1946-01-26 Referring Provider (PT): Wendee Beavers   Encounter Date: 01/17/2020  PT End of Session - 01/17/20 1108    Visit Number  12    Number of Visits  20    Date for PT Re-Evaluation  02/10/20    Authorization Type  Medicare    PT Start Time  1018    PT Stop Time  1102    PT Time Calculation (min)  44 min    Equipment Utilized During Treatment  Gait belt    Activity Tolerance  Patient tolerated treatment well    Behavior During Therapy  [facility] for tasks assessed/performed       Past Medical History:  Diagnosis Date  . Anemia   . Arthritis    osteoarthritis - knees and right shoulder  . Atrial fibrillation [facility])    ablation- 2x's-- 1st time- [facility], 2nd event at [facility] in 2008. Convergent ablation at [facility] 6/14  . Atrial fibrillation ([place])   . Blood transfusion without reported diagnosis   . Breast cancer ([place])    Dr Margot Chimes, total thyroidectomy- 1999- for cancer  . 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 Chronic bilateral pleural effusions   . Colon polyp    Dr Earlean Shawl  . Complete heart block ([place])   . Complication of anesthesia    Ketamine produces LSD reaction, bright colored nightmarish experience   . Dyslipidemia   . Dyspnea   . Endometriosis   . Fibroids   . H/O pleural effusion    s/p thoracentesis w 3226m withdrawn  . Hepatitis    [place]hepatitis   . History of dysphagia    due to radiation therapy  . History of hiatal hernia    small noted on PET scan  . History of kidney stones   . Hx of thyroid cancer    Dr SForde Dandy . Hyperlipidemia   . Hypertension   . Hypothyroidism   . Lung cancer, lower lobe ([place] 01/2017   radiation RX completed 03/04/17; will start chemo 6/27, pt unaware of lung cancer   . Morbid obesity ([place]    Status post lap band surgery  . Nephrolithiasis   . Non Hodgkin's lymphoma ([place]    on chemotherapy  . Persistent atrial fibrillation ([place]    a. s/p PVI 2008 b. s/p convergent ablation 20350complicated by bradycardia requiring pacemaker implant  . Personal history of radiation therapy   . Presence of permanent cardiac pacemaker   . Rotator cuff tear    Right  . Sinus node dysfunction ([place]    Complicating convergent ablation 6/14  . St[place] ([facility]    [place]  . SVC syndrome    with lung mass and non hodgkins lymphoma  . Thyroid cancer ([place] 2000    Past Surgical History:  Procedure Laterality Date  . ABDOMINAL HYSTERECTOMY  1983  . afib ablation     a. 2008 PVI b. 2014 convergent ablation  . APPENDECTOMY    . BONE MARROW BIOPSY  02/21/2017  . BREAST LUMPECTOMY Left 2010  . bso  1998  . CARDIAC CATHETERIZATION     2015- negative  . CARDIOVERSION  10/09/2012   Procedure: CARDIOVERSION;  Surgeon: JMinus Breeding MD;  [place]  Service: Cardiovascular;  Laterality: N/A;  . CARDIOVERSION  10/09/2012   Procedure: CARDIOVERSION;  Surgeon: Minus Breeding, MD;  Location: [facility] ENDOSCOPY;  Service: Cardiovascular;  Laterality: N/A;  [name] gave the ok to add pt to the add on , but we must check to find out if the can add pt on at 1400 ( [phone])  . CARDIOVERSION N/A 11/20/2012   Procedure: CARDIOVERSION;  Surgeon: Fay Records, MD;  Location: [place];  Service: Cardiovascular;  Laterality: N/A;  . CARDIOVERSION N/A 07/18/2017   Procedure: CARDIOVERSION;  Surgeon: Thayer Headings, MD;  Location[facility] ENDOSCOPY;  Service: Cardiovascular;  Laterality: N/A;  . CARDIOVERSION N/A 10/03/2017   Procedure: CARDIOVERSION;  Surgeon: Sanda Klein, MD;  Location: [facility] ENDOSCOPY;  Service: Cardiovascular;  Laterality: N/A;  . CARDIOVERSION N/A 01/07/2018   Procedure: CARDIOVERSION;  Surgeon: Thayer Headings, MD;  Location: [facility] ENDOSCOPY;  Service:  Cardiovascular;  Laterality: N/A;  . CARDIOVERSION N/A 12/10/2019   Procedure: CARDIOVERSION;  Surgeon: Buford Dresser, MD;  Location: [facility] ENDOSCOPY;  Service: Cardiovascular;  Laterality: N/A;  . CHOLECYSTECTOMY    . COLONOSCOPY W/ POLYPECTOMY     Dr Earlean Shawl  . CYSTOSCOPY N/A 02/06/2015   Procedure: CYSTOSCOPY;  Surgeon: Kathie Rhodes, MD;  Location: [facility] ORS;  Service: Urology;  Laterality: N/A;  . CYSTOSCOPY W/ RETROGRADES Left 11/17/2017   Procedure: CYSTOSCOPY WITH RETROGRADE /PYELOGRAM/;  Surgeon: Kathie Rhodes, MD;  Location: [facility] ORS;  Service: Urology;  Laterality: Left;  . CYSTOSCOPY WITH RETROGRADE PYELOGRAM, URETEROSCOPY AND STENT PLACEMENT Right 02/06/2015   Procedure: RETROGRADE PYELOGRAM, RIGHT URETEROSCOPY STENT PLACEMENT;  Surgeon: Kathie Rhodes, MD;  Location: [facility]S;  Service: Urology;  Laterality: Right;  . CYSTOSCOPY WITH RETROGRADE PYELOGRAM, URETEROSCOPY AND STENT PLACEMENT Right 03/07/2017   Procedure: CYSTOSCOPY WITH RIGHT RETROGRADE PYELOGRAM,RIGHT  URETEROSCOPYLASER LITHOTRIPSY  AND STENT PLACEMENT AND STONE BASKETRY;  Surgeon: Kathie Rhodes, MD;  Location: [place];  Service: Urology;  Laterality: Right;  . EYE SURGERY     cataract surgery  . fatty mass removal  1999   pubic area  . HOLMIUM LASER APPLICATION N/A 05/21/5620   Procedure: HOLMIUM LASER APPLICATION;  Surgeon: Kathie Rhodes, MD;  Location: [facility];  Service: Urology;  Laterality: N/A;  . HOLMIUM LASER APPLICATION Right 11/28/6576   Procedure: HOLMIUM LASER APPLICATION;  Surgeon: Kathie Rhodes, MD;  Location[facility];  Service: Urology;  Laterality: Right;  . HOLMIUM LASER APPLICATION Left 4/69/6295   Procedure: HOLMIUM LASER APPLICATION;  Surgeon: Kathie Rhodes, MD;  Location: [facility];  Service: Urology;  Laterality: Left;  . IR FLUORO GUIDE PORT INSERTION RIGHT  02/24/2017  . IR NEPHROSTOMY PLACEMENT RIGHT  11/17/2017  . IR PATIENT EVAL TECH 0-60 MINS  03/11/2017  . IR REMOVAL  TUN ACCESS W/ PORT W/O FL MOD SED  04/20/2018  . IR US GUIDE VASC ACCESS RIGHT  02/24/2017  . KNEE ARTHROSCOPY     bilateral  . LAPAROSCOPIC GASTRIC BANDING  07/10/2010  . LAPAROSCOPIC GASTRIC BANDING     Laparoscopic adjustable banding APS System with posterior hiatal hernia, 2 suture.  Marland Kitchen LAPAROTOMY     for ruptured ovary and ovarian artery   . NEPHROLITHOTOMY Right 11/17/2017   Procedure: NEPHROLITHOTOMY PERCUTANEOUS;  Surgeon: Kathie Rhodes, MD;  Location: [facility];  Service: Urology;  Laterality: Right;  . PACEMAKER INSERTION  03/10/2013   MDT dual chamber PPM  . POCKET REVISION N/A 12/08/2013   Procedure: POCKET REVISION;  Surgeon: Deboraha Sprang, MD;  [facility] CATH  LAB;  Service: Cardiovascular;  Laterality: N/A;  . PORTA CATH INSERTION    . REVERSE SHOULDER ARTHROPLASTY Right 05/14/2018   Procedure: RIGHT REVERSE SHOULDER ARTHROPLASTY;  Surgeon: Tania Ade, MD;  Location: [place];  Service: Orthopedics;  Laterality: Right;  . REVERSE SHOULDER REPLACEMENT Right 05/14/2018  . RIGHT HEART CATH N/A 07/21/2019   Procedure: RIGHT HEART CATH;  Surgeon: Larey Dresser, MD;  Location: [place] CV LAB;  Service: Cardiovascular;  Laterality: N/A;  . TEE WITH CARDIOVERSION  09/22/2017  . TEE WITHOUT CARDIOVERSION N/A 10/03/2017   Procedure: TRANSESOPHAGEAL ECHOCARDIOGRAM (TEE);  Surgeon: Sanda Klein, MD;  Location: [facility] ENDOSCOPY;  Service: Cardiovascular;  Laterality: N/A;  . TEE WITHOUT CARDIOVERSION N/A 08/04/2019   Procedure: TRANSESOPHAGEAL ECHOCARDIOGRAM (TEE);  Surgeon: Larey Dresser, MD;  Location: [facility] ENDOSCOPY;  Service: Cardiovascular;  Laterality: N/A;  . TEE WITHOUT CARDIOVERSION N/A 12/10/2019   Procedure: TRANSESOPHAGEAL ECHOCARDIOGRAM (TEE);  Surgeon: Buford Dresser, MD;  Location: [facility] ENDOSCOPY;  Service: Cardiovascular;  Laterality: N/A;  . THYROIDECTOMY  1998   Dr Margot Chimes  . TONSILLECTOMY    . TOTAL KNEE ARTHROPLASTY  04/13/2012   Procedure: TOTAL KNEE  ARTHROPLASTY;  Surgeon: Rudean Haskell, MD;  Location: [place];  Service: Orthopedics;  Laterality: Right;  Marland Kitchen VIDEO BRONCHOSCOPY WITH ENDOBRONCHIAL ULTRASOUND N/A 02/07/2017   Procedure: VIDEO BRONCHOSCOPY WITH ENDOBRONCHIAL ULTRASOUND;  Surgeon: Marshell Garfinkel, MD;  Location: [place];  Service: Pulmonary;  Laterality: N/A;    There were no vitals filed for this visit.  Subjective Assessment - 01/17/20 1107    Subjective  Pt feels slightly more off balance today.    Currently in Pain?  Yes    Pain Location  Shoulder    Pain Orientation  Right    Pain Descriptors / Indicators  Sore;Spasm    Pain Onset  More than a month ago    Pain Frequency  Intermittent                       [facility] Adult PT Treatment/Exercise - 01/17/20 1026      Ambulation/Gait   Gait Comments  49f x 6 SPC ,  Side stepping 10 ft x 4 SPC       Knee/Hip Exercises: Aerobic   Recumbent Bike  L1 x 5 min      Knee/Hip Exercises: Standing   Heel Raises  Both;20 reps    Hip Flexion  20 reps;Knee bent    Hip Abduction  2 sets;10 reps    Forward Step Up  10 reps;Both    Other Standing Knee Exercises  Tandem stance w UE flexion x10 each bil; ; Anterior stepping with weight shifts x10 bil;       Knee/Hip Exercises: Seated   Long Arc Quad  20 reps;Both    Long Arc Quad Weight  2 lbs.               PT Short Term Goals - 01/13/20 1330      PT SHORT TERM GOAL #1   Title  Pt to be independent with initial HEP for LE strength    Time  2    Period  Weeks    Status  Achieved    Target Date  12/22/19      PT SHORT TERM GOAL #2   Title  Pt with be compliant with safe use of 4 WW at all times, for improved safety    Time  2  Period  Weeks    Status  Achieved    Target Date  12/22/19        PT Long Term Goals - 01/13/20 1331      PT LONG TERM GOAL #1   Title  Pt to be independent with final HEP, for strength and balance    Time  6    Period  Weeks    Status  On-going    Target Date   02/10/20      PT LONG TERM GOAL #2   Title  Pt to demo improved score on BERG by at least 5 points, to improve balance and safety    Time  6    Period  Weeks    Status  Partially Met    Target Date  02/10/20      PT LONG TERM GOAL #3   Title  Pt to demo improved LE strength to at least 4+/5 to improve stability and endurance.    Time  6    Period  Weeks    Status  Partially Met    Target Date  02/10/20      PT LONG TERM GOAL #4   Title  Pt to demo ability for ambulation with LRAD, for at least 500 ft, with direction changes and dynamic stability to be [facility] for pt age.    Time  6    Period  Weeks    Status  Partially Met    Target Date  02/10/20            Plan - 01/17/20 1116    Clinical Impression Statement  Pt with more slighlty instability and guarding needed today from previous sessions. Continued dynamic stability and ther ex for LE strength. Pt with decreased step height with L foot today. Will progress gait, balanc,e and strenth as tolerated.    Personal Factors and Comorbidities  Comorbidity 1;Comorbidity 2    Comorbidities  A-Fib, dizziness, frequent falls, OA, Neuropathy    Examination-Activity Limitations  Bathing;Dressing;Hygiene/Grooming;Bend;Lift;Squat;Stairs;Locomotion Level    Examination-Participation Restrictions  Laundry;Cleaning;Shop;Community Activity;Meal Prep    Rehab Potential  Good    PT Frequency  2x / week    PT Duration  6 weeks    PT Treatment/Interventions  ADLs/Self Care Home Management;DME Instruction;Gait training;Stair training;Functional mobility training;Therapeutic activities;Therapeutic exercise;Balance training;Neuromuscular re-education;Patient/family education;Orthotic Fit/Training;Scar mobilization;Passive range of motion;Dry needling;Vestibular;Manual techniques;Canalith Repostioning;Cryotherapy;Electrical Stimulation;Iontophoresis 59m/ml Dexamethasone;Moist Heat    Consulted and Agree with Plan of Care  Patient       Patient will  benefit from skilled therapeutic intervention in order to improve the following deficits and impairments:  Abnormal gait, Decreased balance, Decreased mobility, Difficulty walking, Dizziness, Decreased knowledge of precautions, Cardiopulmonary status limiting activity, Decreased activity tolerance, Decreased coordination, Decreased safety awareness, Decreased strength, Pain  Visit Diagnosis: Other abnormalities of gait and mobility     Problem List Patient Active Problem List   Diagnosis Date Noted  . ICH (intracerebral hemorrhage) ([place] 12/02/2019  . A-fib ([place] 12/02/2019  . Anemia 12/02/2019  . QT prolongation 12/02/2019  . Hypothyroidism 12/02/2019  . Gait abnormality 07/10/2018  . S/P reverse total shoulder arthroplasty, 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  . Persistent atrial fibrillation ([place]   . Bilateral ureteral calculi 11/17/2017  . Atrial fibrillation with RVR ([place] 09/15/2017  . Atrial flutter ([place] 07/17/2017  . Port catheter in place 04/02/2017  . Non-Hodgkin lymphoma, unspecified, intrathoracic lymph nodes ([place] 02/13/2017  . Mediastinal mass 02/06/2017  . 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[place] 02/06/2017  . Abnormal  chest x-ray   . Lung mass 02/03/2017  . 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  . OSA (obstructive sleep apnea) 02/03/2015  . History of renal calculi 11/16/2014  . Renal calculi 11/16/2014  . Family history of colon cancer 10/26/2014  . Mechanical complication due to cardiac pacemaker pulse generator 11/06/2013  . Dyspnea 05/12/2013  . Hypothyroidism 04/21/2013  . Pleural effusion 04/19/2013  . Long term (current) use of anticoagulants 12/20/2010  . EPIDERMOID CYST 08/22/2010  . Chronic diastolic heart failure ([place]) 08/06/2010  . SYNCOPE AND COLLAPSE 07/27/2010  . OSTEOARTHRITIS, KNEE, RIGHT 03/15/2010  . Morbid obesity ([place]) 12/07/2009  . NONSPEC ELEVATION OF LEVELS OF TRANSAMINASE/LDH 09/08/2009  . BREAST CANCER, HX OF 07/25/2009  . COLONIC POLYPS, HX OF  07/25/2009  . TUBULOVILLOUS ADENOMA, COLON 04/29/2008  . HYPERGLYCEMIA, FASTING 04/29/2008  . HYPERLIPIDEMIA 06/22/2007  . CARCINOMA, THYROID GLAND, HX OF 06/22/2007   Lyndee Hensen, PT, DPT 11:19 AM  01/17/20    [facility] [address] [place], [place], [address] Phone: [phone]   Fax:  [phone]  Name: Kayla Harrison MRN: [id number] Date of Birth: 25-Apr-1946

## 2020-01-19 ENCOUNTER — Other Ambulatory Visit: Payer: Self-pay

## 2020-01-19 ENCOUNTER — Ambulatory Visit (INDEPENDENT_AMBULATORY_CARE_PROVIDER_SITE_OTHER): Payer: Medicare Other | Admitting: Physical Therapy

## 2020-01-19 ENCOUNTER — Encounter: Payer: Self-pay | Admitting: Physical Therapy

## 2020-01-19 DIAGNOSIS — R2689 Other abnormalities of gait and mobility: Secondary | ICD-10-CM

## 2020-01-19 NOTE — Therapy (Signed)
Day [address] [place], [place], [address] Phone: [phone]   Fax:  [phone]  Physical Therapy Treatment  Patient Details  Name: Kayla Harrison MRN: [id number] Date of Birth: 1945/12/08 Referring Provider (PT): Wendee Beavers   Encounter Date: 01/19/2020  PT End of Session - 01/19/20 1118    Visit Number  13    Number of Visits  20    Date for PT Re-Evaluation  02/10/20    Authorization Type  Medicare    PT Start Time  0177    PT Stop Time  1105    PT Time Calculation (min)  50 min    Equipment Utilized During Treatment  Gait belt    Activity Tolerance  Patient tolerated treatment well    Behavior During Therapy  [facility] for tasks assessed/performed       Past Medical History:  Diagnosis Date  . Anemia   . Arthritis    osteoarthritis - knees and right shoulder  . Atrial fibrillation [facility])    ablation- 2x's-- 1st time- [facility], 2nd event at [facility] in 2008. Convergent ablation at [facility] 6/14  . Atrial fibrillation ([place])   . Blood transfusion without reported diagnosis   . Breast cancer ([place])    Dr Margot Chimes, total thyroidectomy- 1999- for cancer  . 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 Chronic bilateral pleural effusions   . Colon polyp    Dr Earlean Shawl  . Complete heart block ([place])   . Complication of anesthesia    Ketamine produces LSD reaction, bright colored nightmarish experience   . Dyslipidemia   . Dyspnea   . Endometriosis   . Fibroids   . H/O pleural effusion    s/p thoracentesis w 321m withdrawn  . Hepatitis    [place]hepatitis   . History of dysphagia    due to radiation therapy  . History of hiatal hernia    small noted on PET scan  . History of kidney stones   . Hx of thyroid cancer    Dr SForde Dandy . Hyperlipidemia   . Hypertension   . Hypothyroidism   . Lung cancer, lower lobe ([place] 01/2017   radiation RX completed 03/04/17; will start chemo 6/27, pt unaware of lung cancer   . Morbid obesity ([place]    Status post lap band surgery  . Nephrolithiasis   . Non Hodgkin's lymphoma ([place]    on chemotherapy  . Persistent atrial fibrillation ([place]    a. s/p PVI 2008 b. s/p convergent ablation [id number] by bradycardia requiring pacemaker implant  . Personal history of radiation therapy   . Presence of permanent cardiac pacemaker   . Rotator cuff tear    Right  . Sinus node dysfunction ([place]    Complicating convergent ablation 6/14  . St[place] ([facility]    [place]  . SVC syndrome    with lung mass and non hodgkins lymphoma  . Thyroid cancer ([place] 2000    Past Surgical History:  Procedure Laterality Date  . ABDOMINAL HYSTERECTOMY  1983  . afib ablation     a. 2008 PVI b. 2014 convergent ablation  . APPENDECTOMY    . BONE MARROW BIOPSY  02/21/2017  . BREAST LUMPECTOMY Left 2010  . bso  1998  . CARDIAC CATHETERIZATION     2015- negative  . CARDIOVERSION  10/09/2012   Procedure: CARDIOVERSION;  Surgeon: JMinus Breeding MD;  [place]  Service: Cardiovascular;  Laterality: N/A;  . CARDIOVERSION  10/09/2012   Procedure: CARDIOVERSION;  Surgeon: Minus Breeding, MD;  Location: [facility] ENDOSCOPY;  Service: Cardiovascular;  Laterality: N/A;  [name] gave the ok to add pt to the add on , but we must check to find out if the can add pt on at 1400 ( [phone])  . CARDIOVERSION N/A 11/20/2012   Procedure: CARDIOVERSION;  Surgeon: Fay Records, MD;  Location: [place];  Service: Cardiovascular;  Laterality: N/A;  . CARDIOVERSION N/A 07/18/2017   Procedure: CARDIOVERSION;  Surgeon: Thayer Headings, MD;  Location: [facility] ENDOSCOPY;  Service: Cardiovascular;  Laterality: N/A;  . CARDIOVERSION N/A 10/03/2017   Procedure: CARDIOVERSION;  Surgeon: Sanda Klein, MD;  Location: [facility] ENDOSCOPY;  Service: Cardiovascular;  Laterality: N/A;  . CARDIOVERSION N/A 01/07/2018   Procedure: CARDIOVERSION;  Surgeon: Thayer Headings, MD;  Location: [facility] ENDOSCOPY;  Service:  Cardiovascular;  Laterality: N/A;  . CARDIOVERSION N/A 12/10/2019   Procedure: CARDIOVERSION;  Surgeon: Buford Dresser, MD;  Location: [facility] ENDOSCOPY;  Service: Cardiovascular;  Laterality: N/A;  . CHOLECYSTECTOMY    . COLONOSCOPY W/ POLYPECTOMY     Dr Earlean Shawl  . CYSTOSCOPY N/A 02/06/2015   Procedure: CYSTOSCOPY;  Surgeon: Kathie Rhodes, MD;  Location: [facility] ORS;  Service: Urology;  Laterality: N/A;  . CYSTOSCOPY W/ RETROGRADES Left 11/17/2017   Procedure: CYSTOSCOPY WITH RETROGRADE /PYELOGRAM/;  Surgeon: Kathie Rhodes, MD;  Location: [facility] ORS;  Service: Urology;  Laterality: Left;  . CYSTOSCOPY WITH RETROGRADE PYELOGRAM, URETEROSCOPY AND STENT PLACEMENT Right 02/06/2015   Procedure: RETROGRADE PYELOGRAM, RIGHT URETEROSCOPY STENT PLACEMENT;  Surgeon: Kathie Rhodes, MD;  Location: [facility] ORS;  Service: Urology;  Laterality: Right;  . CYSTOSCOPY WITH RETROGRADE PYELOGRAM, URETEROSCOPY AND STENT PLACEMENT Right 03/07/2017   Procedure: CYSTOSCOPY WITH RIGHT RETROGRADE PYELOGRAM,RIGHT  URETEROSCOPYLASER LITHOTRIPSY  AND STENT PLACEMENT AND STONE BASKETRY;  Surgeon: Kathie Rhodes, MD;  Location: [place];  Service: Urology;  Laterality: Right;  . EYE SURGERY     cataract surgery  . fatty mass removal  1999   pubic area  . HOLMIUM LASER APPLICATION N/A 05/21/5620   Procedure: HOLMIUM LASER APPLICATION;  Surgeon: Kathie Rhodes, MD;  Location: [facility]S;  Service: Urology;  Laterality: N/A;  . HOLMIUM LASER APPLICATION Right 11/28/6576   Procedure: HOLMIUM LASER APPLICATION;  Surgeon: Kathie Rhodes, MD;  Location: [facility];  Service: Urology;  Laterality: Right;  . HOLMIUM LASER APPLICATION Left 4/69/6295   Procedure: HOLMIUM LASER APPLICATION;  Surgeon: Kathie Rhodes, MD;  Location: [facility] ORS;  Service: Urology;  Laterality: Left;  . IR FLUORO GUIDE PORT INSERTION RIGHT  02/24/2017  . IR NEPHROSTOMY PLACEMENT RIGHT  11/17/2017  . IR PATIENT EVAL TECH 0-60 MINS  03/11/2017  . IR REMOVAL  TUN ACCESS W/ PORT W/O FL MOD SED  04/20/2018  . IR US GUIDE VASC ACCESS RIGHT  02/24/2017  . KNEE ARTHROSCOPY     bilateral  . LAPAROSCOPIC GASTRIC BANDING  07/10/2010  . LAPAROSCOPIC GASTRIC BANDING     Laparoscopic adjustable banding APS System with posterior hiatal hernia, 2 suture.  Marland Kitchen LAPAROTOMY     for ruptured ovary and ovarian artery   . NEPHROLITHOTOMY Right 11/17/2017   Procedure: NEPHROLITHOTOMY PERCUTANEOUS;  Surgeon: Kathie Rhodes, MD;  Location: [facility]S;  Service: Urology;  Laterality: Right;  . PACEMAKER INSERTION  03/10/2013   MDT dual chamber PPM  . POCKET REVISION N/A 12/08/2013   Procedure: POCKET REVISION;  Surgeon: Deboraha Sprang, MD;  Location: [facility] CATH  LAB;  Service: Cardiovascular;  Laterality: N/A;  . PORTA CATH INSERTION    . REVERSE SHOULDER ARTHROPLASTY Right 05/14/2018   Procedure: RIGHT REVERSE SHOULDER ARTHROPLASTY;  Surgeon: Tania Ade, MD;  Location: [place];  Service: Orthopedics;  Laterality: Right;  . REVERSE SHOULDER REPLACEMENT Right 05/14/2018  . RIGHT HEART CATH N/A 07/21/2019   Procedure: RIGHT HEART CATH;  Surgeon: Larey Dresser, MD;  Location: [place] CV LAB;  Service: Cardiovascular;  Laterality: N/A;  . TEE WITH CARDIOVERSION  09/22/2017  . TEE WITHOUT CARDIOVERSION N/A 10/03/2017   Procedure: TRANSESOPHAGEAL ECHOCARDIOGRAM (TEE);  Surgeon: Sanda Klein, MD;  Location: [facility] ENDOSCOPY;  Service: Cardiovascular;  Laterality: N/A;  . TEE WITHOUT CARDIOVERSION N/A 08/04/2019   Procedure: TRANSESOPHAGEAL ECHOCARDIOGRAM (TEE);  Surgeon: Larey Dresser, MD;  Location: [facility] ENDOSCOPY;  Service: Cardiovascular;  Laterality: N/A;  . TEE WITHOUT CARDIOVERSION N/A 12/10/2019   Procedure: TRANSESOPHAGEAL ECHOCARDIOGRAM (TEE);  Surgeon: Buford Dresser, MD;  Location: [facility] ENDOSCOPY;  Service: Cardiovascular;  Laterality: N/A;  . THYROIDECTOMY  1998   Dr Margot Chimes  . TONSILLECTOMY    . TOTAL KNEE ARTHROPLASTY  04/13/2012   Procedure: TOTAL KNEE  ARTHROPLASTY;  Surgeon: Rudean Haskell, MD;  Location: [place];  Service: Orthopedics;  Laterality: Right;  Marland Kitchen VIDEO BRONCHOSCOPY WITH ENDOBRONCHIAL ULTRASOUND N/A 02/07/2017   Procedure: VIDEO BRONCHOSCOPY WITH ENDOBRONCHIAL ULTRASOUND;  Surgeon: Marshell Garfinkel, MD;  Location: [place];  Service: Pulmonary;  Laterality: N/A;    There were no vitals filed for this visit.  Subjective Assessment - 01/19/20 1116    Subjective  No new complaints. R shoulder blade muscles still very sore.    Currently in Pain?  Yes    Pain Score  3     Pain Location  Shoulder    Pain Orientation  Right    Pain Descriptors / Indicators  Spasm;Sore    Pain Type  Acute pain    Pain Onset  More than a month ago    Pain Frequency  Intermittent         [facility] PT Assessment - 01/19/20 1042      AROM   Overall AROM Comments  R shoulder: mild limitations for elevation      Palpation   Palpation comment  Painful trigger points in R rhomboid, mid trap, and thoracic paraspinals.                    [place] Adult PT Treatment/Exercise - 01/19/20 1042      Ambulation/Gait   Gait Comments  200 ft outdoor/grass and sidewalk with SPC (CGA), Curbs x5 with SPC,  Level ground 200 ft with SPC.       Knee/Hip Exercises: Standing   Heel Raises  --    Hip Flexion  20 reps;Knee bent    Hip Abduction  2 sets;10 reps    Forward Step Up  --    Stairs  up/down 5 steps, 1 hand rail, SPC. reciprocol, x4,      Other Standing Knee Exercises  --    Other Standing Knee Exercises  Lateral stepping w weight shifts / SPC x 15;       Knee/Hip Exercises: Seated   Long Arc Quad  --    Long Arc [employer]  --    Sit to [employer]  10 reps;with UE support      Manual Therapy   Manual therapy comments  skilled palpation and monitoring of soft tissue with dry needling.  Trigger Point Dry Needling - 01/19/20 0001    Consent Given?  Yes    Education Handout Provided  Yes    Muscles Treated Upper Quadrant  Rhomboids     Rhomboids Response  Twitch response elicited;Palpable increased muscle length   R          PT Education - 01/19/20 1117    Education Details  Education for Dry needling use, risks, benefits.    Person(s) Educated  Patient    Methods  Explanation;Demonstration    Comprehension  Verbalized understanding       PT Short Term Goals - 01/13/20 1330      PT SHORT TERM GOAL #1   Title  Pt to be independent with initial HEP for LE strength    Time  2    Period  Weeks    Status  Achieved    Target Date  12/22/19      PT SHORT TERM GOAL #2   Title  Pt with be compliant with safe use of 4 WW at all times, for improved safety    Time  2    Period  Weeks    Status  Achieved    Target Date  12/22/19        PT Long Term Goals - 01/13/20 1331      PT LONG TERM GOAL #1   Title  Pt to be independent with final HEP, for strength and balance    Time  6    Period  Weeks    Status  On-going    Target Date  02/10/20      PT LONG TERM GOAL #2   Title  Pt to demo improved score on BERG by at least 5 points, to improve balance and safety    Time  6    Period  Weeks    Status  Partially Met    Target Date  02/10/20      PT LONG TERM GOAL #3   Title  Pt to demo improved LE strength to at least 4+/5 to improve stability and endurance.    Time  6    Period  Weeks    Status  Partially Met    Target Date  02/10/20      PT LONG TERM GOAL #4   Title  Pt to demo ability for ambulation with LRAD, for at least 500 ft, with direction changes and dynamic stability to be [facility] for pt age.    Time  6    Period  Weeks    Status  Partially Met    Target Date  02/10/20            Plan - 01/19/20 1119    Clinical Impression Statement  Assessment for R thoracic pain: tenderness and trigger points in R rhomboid, and thoracic longissimus. Discussed use of heat and rolling tennis ball for MFR. DN done for trigger points, pt with good tolerance. Discussed use of heat later today and tomorrow as  needed. Pt with improved ability for ambulation on uneven surfaces, grass and curbs today, with use of SPC. Pt does require seated rest breaks during session, but has shown improved tolerance for functoinal activity, and improved safety. Pt progressing well.    Personal Factors and Comorbidities  Comorbidity 1;Comorbidity 2    Comorbidities  A-Fib, dizziness, frequent falls, OA, Neuropathy    Examination-Activity Limitations  Bathing;Dressing;Hygiene/Grooming;Bend;Lift;Squat;Stairs;Locomotion Level    Examination-Participation Restrictions  Laundry;Cleaning;Shop;Community Activity;Meal Prep  Rehab Potential  Good    PT Frequency  2x / week    PT Duration  6 weeks    PT Treatment/Interventions  ADLs/Self Care Home Management;DME Instruction;Gait training;Stair training;Functional mobility training;Therapeutic activities;Therapeutic exercise;Balance training;Neuromuscular re-education;Patient/family education;Orthotic Fit/Training;Scar mobilization;Passive range of motion;Dry needling;Vestibular;Manual techniques;Canalith Repostioning;Cryotherapy;Electrical Stimulation;Iontophoresis 78m/ml Dexamethasone;Moist Heat    Consulted and Agree with Plan of Care  Patient       Patient will benefit from skilled therapeutic intervention in order to improve the following deficits and impairments:  Abnormal gait, Decreased balance, Decreased mobility, Difficulty walking, Dizziness, Decreased knowledge of precautions, Cardiopulmonary status limiting activity, Decreased activity tolerance, Decreased coordination, Decreased safety awareness, Decreased strength, Pain  Visit Diagnosis: Other abnormalities of gait and mobility     Problem List Patient Active Problem List   Diagnosis Date Noted  . ICH (intracerebral hemorrhage) ([place] 12/02/2019  . A-fib ([place] 12/02/2019  . Anemia 12/02/2019  . QT prolongation 12/02/2019  . Hypothyroidism 12/02/2019  . Gait abnormality 07/10/2018  . S/P reverse total  shoulder arthroplasty, 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  . Persistent atrial fibrillation ([place]   . Bilateral ureteral calculi 11/17/2017  . Atrial fibrillation with RVR ([place] 09/15/2017  . Atrial flutter ([place] 07/17/2017  . Port catheter in place 04/02/2017  . Non-Hodgkin lymphoma, unspecified, intrathoracic lymph nodes ([place] 02/13/2017  . Mediastinal mass 02/06/2017  . 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[place] 02/06/2017  . Abnormal chest x-ray   . Lung mass 02/03/2017  . 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  . OSA (obstructive sleep apnea) 02/03/2015  . History of renal calculi 11/16/2014  . Renal calculi 11/16/2014  . Family history of colon cancer 10/26/2014  . Mechanical complication due to cardiac pacemaker pulse generator 11/06/2013  . Dyspnea 05/12/2013  . Hypothyroidism 04/21/2013  . Pleural effusion 04/19/2013  . Long term (current) use of anticoagulants 12/20/2010  . EPIDERMOID CYST 08/22/2010  . Chronic diastolic heart failure ([place] 08/06/2010  . SYNCOPE AND COLLAPSE 07/27/2010  . OSTEOARTHRITIS, KNEE, RIGHT 03/15/2010  . Morbid obesity ([place] 12/07/2009  . NONSPEC ELEVATION OF LEVELS OF TRANSAMINASE/LDH 09/08/2009  . BREAST CANCER, HX OF 07/25/2009  . COLONIC POLYPS, HX OF 07/25/2009  . TUBULOVILLOUS ADENOMA, COLON 04/29/2008  . HYPERGLYCEMIA, FASTING 04/29/2008  . HYPERLIPIDEMIA 06/22/2007  . CARCINOMA, THYROID GLAND, HX OF 06/22/2007    Kayla Harrison PT, DPT 11:26 AM  01/19/20    [facility] Center[facility]runo4[facility] [place] [address]Phone: [phone]  Fax:  [phone] Name: Kayla BRAUDMRN: [id number] of Birth: 9Apr 16, 1947

## 2020-01-23 DIAGNOSIS — I498 Other specified cardiac arrhythmias: Secondary | ICD-10-CM | POA: Insufficient documentation

## 2020-01-23 DIAGNOSIS — Z950 Presence of cardiac pacemaker: Secondary | ICD-10-CM | POA: Insufficient documentation

## 2020-01-23 DIAGNOSIS — R002 Palpitations: Secondary | ICD-10-CM | POA: Insufficient documentation

## 2020-01-24 ENCOUNTER — Other Ambulatory Visit: Payer: Self-pay

## 2020-01-24 ENCOUNTER — Ambulatory Visit (INDEPENDENT_AMBULATORY_CARE_PROVIDER_SITE_OTHER): Payer: Medicare Other | Admitting: Internal Medicine

## 2020-01-24 ENCOUNTER — Encounter: Payer: Self-pay | Admitting: Physical Therapy

## 2020-01-24 ENCOUNTER — Encounter: Payer: Self-pay | Admitting: Internal Medicine

## 2020-01-24 ENCOUNTER — Ambulatory Visit (INDEPENDENT_AMBULATORY_CARE_PROVIDER_SITE_OTHER): Payer: Medicare Other | Admitting: Physical Therapy

## 2020-01-24 VITALS — BP 106/78 | HR 71 | Ht 66.0 in | Wt 220.0 lb

## 2020-01-24 DIAGNOSIS — R002 Palpitations: Secondary | ICD-10-CM | POA: Diagnosis not present

## 2020-01-24 DIAGNOSIS — R2689 Other abnormalities of gait and mobility: Secondary | ICD-10-CM

## 2020-01-24 DIAGNOSIS — I4819 Other persistent atrial fibrillation: Secondary | ICD-10-CM

## 2020-01-24 DIAGNOSIS — I4892 Unspecified atrial flutter: Secondary | ICD-10-CM

## 2020-01-24 DIAGNOSIS — Z950 Presence of cardiac pacemaker: Secondary | ICD-10-CM | POA: Diagnosis not present

## 2020-01-24 DIAGNOSIS — I498 Other specified cardiac arrhythmias: Secondary | ICD-10-CM | POA: Diagnosis not present

## 2020-01-24 MED ORDER — RANOLAZINE ER 500 MG PO TB12: 500.0000 mg | ORAL_TABLET | Freq: Two times a day (BID) | ORAL | 3 refills | Status: DC

## 2020-01-24 NOTE — Progress Notes (Signed)
Patient Care Team: [name], MD as PCP - General (Endocrinology) Deboraha Sprang, MD as PCP - Cardiology (Cardiology) [name], MD (Endocrinology)   HPI  Kayla Harrison is a 74 y.o. female Seen in followup for a pacemaker implanted at [facility] and atrial fibrillation.  She underwent pulmonary vein isolation at [facility] and had recurrent atrial fibrillation and underwent convergent ablation at [facility] 6/81; it was complicated by heart block prompting pacemaker implantation.  She has had recurrent aFib and  was restarted on amio--and then stopped 2/2 worsening SOB  She has a history of prior strokes and remains on oral anticoagulation . Hospitalized 12/18 for wide-complex tachycardia which was thought to be related to intermittent pacing because of atrial flutter with blanking Reprogrammed DDIR.  Amiodarone resumed     Symptomatic orthostasis; tried an abdominal binder without improvement.   5/18 presented w SOB and facial fullness, found to have SVC syndrome 2/2 NHlymphoma Rx initially with XRT  Now in remission     Date Cr K TSH ALT Hgb  8/18   0.2 15 11.9  9/18                       9.3  3/19   0.722 (12/18) 23 11.2  4/20    18 13.2  11/20 1.22 4.5 0.74 (7/20) 17(7/20) 11.9        Has seen Dr Halford Chessman >Echo>> pulm HTN   I reviewed with DMcL re [place] who concurred with Dr Collie Siad concern and communicating with the latter today favored RHC over CPX  Raising questions of amiodarone lung toxicity; DLCO was decreased but CT scan was not suggestive.  anticoagulation with Apixoban    Orthostasis with falls.  Attenuated by cooler showers.     Hospitalized 3/21.  Atrial fibrillation with rapid rates having presented with a fall.  Brain imaging was concerning initially for a possible brain lesion; neurosurgery and MRI was negative.  Amiodarone dose was increased with a reload.  She was discharged for outpatient cardioversion. 12/10/19  DATE TEST EF   2011 Echo  55 % LAE 40  mm  7/14 Echo  55-60 %   9/18 Echo  55-60%   12/18 Echo  55-60% Severe LAE ?  11/20 TEE 55%  moderate MR/TR mild MS   She has felt some better.  She still has significant dyspnea on exertion.  She is going to physical therapy with gradual improvement.  Concerned about leg weakness.   Remote morbid obesity status>>  post lap band surgery    Thromboembolic risk factors ( age -25 , TIA/CVA-2, Gender-1) for a CHADSVASc Score of 5   Past Medical History:  Diagnosis Date  . Anemia   . Arthritis    osteoarthritis - knees and right shoulder  . Atrial fibrillation [facility])    ablation- 2x's-- 1st time- [facility], 2nd event at [facility] in 2008. Convergent ablation at [facility] 6/14  . Atrial fibrillation ([place])   . Blood transfusion without reported diagnosis   . Breast cancer ([place])    Dr Margot Chimes, total thyroidectomy- 1999- for cancer  . 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 Chronic bilateral pleural effusions   . Colon polyp    Dr Earlean Shawl  . Complete heart block ([place])   . Complication of anesthesia    Ketamine produces LSD reaction, bright colored nightmarish experience   . Dyslipidemia   . Dyspnea   . Endometriosis   . Fibroids   .  H/O pleural effusion    s/p thoracentesis w 3239m withdrawn  . Hepatitis    [place] ?hepatitis   . History of dysphagia    due to radiation therapy  . History of hiatal hernia    small noted on PET scan  . History of kidney stones   . Hx of thyroid cancer    Dr SForde Dandy . Hyperlipidemia   . Hypertension   . Hypothyroidism   . Lung cancer, [place] lobe [place] 01[place]   radiation RX completed 03/04/17; will start chemo 6/27, pt unaware of lung cancer  . Morbid obesity ([place]    Status post lap band surgery  . Nephrolithiasis   . Non Hodgkin's lymphoma ([place]    on chemotherapy  . Persistent atrial fibrillation ([place]    a. s/p PVI 2008 b. s/p convergent ablation [id number] by bradycardia requiring pacemaker implant  . Personal history of  radiation therapy   . Presence of permanent cardiac pacemaker   . Rotator cuff tear    Right  . Sinus node dysfunction ([place]    Complicating convergent ablation 6/14  . [place]    [place]  . SVC syndrome    with lung mass and non hodgkins lymphoma  . Thyroid cancer ([place]    Past Surgical History:  Procedure Laterality Date  . ABDOMINAL HYSTERECTOMY  1983  . afib ablation     a. 2008 PVI b. 2014 convergent ablation  . APPENDECTOMY    . BONE MARROW BIOPSY  02/21/2017  . BREAST LUMPECTOMY Left 2010  . bso  1998  . CARDIAC CATHETERIZATION     2015- negative  . CARDIOVERSION  10/09/2012   Procedure: CARDIOVERSION;  Surgeon: JMinus Breeding MD;  Location: [place]  Service: Cardiovascular;  Laterality: N/A;  . CARDIOVERSION  10/09/2012   Procedure: CARDIOVERSION;  Surgeon: JMinus Breeding MD;  [facility]  Service: Cardiovascular;  Laterality: N/A;  [name] the ok to add pt to the add on , but we must check to find out if the can add pt on at 1400 ( [phone])  . CARDIOVERSION N/A 11/20/2012   Procedure: CARDIOVERSION;  Surgeon: PFay Records MD;  [place]  Service: Cardiovascular;  Laterality: N/A;  . CARDIOVERSION N/A 07/18/2017   Procedure: CARDIOVERSION;  Surgeon: NThayer Headings MD;  [facility]  Service: Cardiovascular;  Laterality: N/A;  . CARDIOVERSION N/A 10/03/2017   Procedure: CARDIOVERSION;  Surgeon: CSanda  MD;  [facility];  Service: Cardiovascular;  Laterality: N/A;  . CARDIOVERSION N/A 01/07/2018   Procedure: CARDIOVERSION;  Surgeon: NThayer Headings MD;  [facility];  Service: Cardiovascular;  Laterality: N/A;  . CARDIOVERSION N/A 12/10/2019   Procedure: CARDIOVERSION;  Surgeon: CBuford Dresser MD;  [facility];  Service: Cardiovascular;  Laterality: N/A;  . CHOLECYSTECTOMY    . COLONOSCOPY W/ POLYPECTOMY     Dr MEarlean Shawl . CYSTOSCOPY N/A 02/06/2015   Procedure:  CYSTOSCOPY;  Surgeon: MKathie Rhodes MD;  [facility];  Service: Urology;  Laterality: N/A;  . CYSTOSCOPY W/ RETROGRADES Left 11/17/2017   Procedure: CYSTOSCOPY WITH RETROGRADE /PYELOGRAM/;  Surgeon: OKathie Rhodes MD;  [facility];  Service: Urology;  Laterality: Left;  . CYSTOSCOPY WITH RETROGRADE PYELOGRAM, URETEROSCOPY AND STENT PLACEMENT Right 02/06/2015   Procedure: RETROGRADE PYELOGRAM, RIGHT URETEROSCOPY STENT PLACEMENT;  Surgeon: MKathie Rhodes MD;  [facility];  Service: Urology;  Laterality: Right;  .  CYSTOSCOPY WITH RETROGRADE PYELOGRAM, URETEROSCOPY AND STENT PLACEMENT Right 03/07/2017   Procedure: CYSTOSCOPY WITH RIGHT RETROGRADE PYELOGRAM,RIGHT  URETEROSCOPYLASER LITHOTRIPSY  AND STENT PLACEMENT AND STONE BASKETRY;  Surgeon: Kathie Rhodes, MD;  Location: [place];  Service: Urology;  Laterality: Right;  . EYE SURGERY     cataract surgery  . fatty mass removal  1999   pubic area  . HOLMIUM LASER APPLICATION N/A 11/21/6008   Procedure: HOLMIUM LASER APPLICATION;  Surgeon: Kathie Rhodes, MD;  [facility];  Service: Urology;  Laterality: N/A;  . HOLMIUM LASER APPLICATION Right 9/32/3557   Procedure: HOLMIUM LASER APPLICATION;  Surgeon: Kathie Rhodes, MD;  Location[facility];  Service: Urology;  Laterality: Right;  . HOLMIUM LASER APPLICATION Left 12/12/252   Procedure: HOLMIUM LASER APPLICATION;  Surgeon: Kathie Rhodes, MD;  [facility];  Service: Urology;  Laterality: Left;  . IR FLUORO GUIDE PORT INSERTION RIGHT  02/24/2017  . IR NEPHROSTOMY PLACEMENT RIGHT  11/17/2017  . IR PATIENT EVAL TECH 0-60 MINS  03/11/2017  . IR REMOVAL TUN ACCESS W/ PORT W/O FL MOD SED  04/20/2018  . IR US GUIDE VASC ACCESS RIGHT  02/24/2017  . KNEE ARTHROSCOPY     bilateral  . LAPAROSCOPIC GASTRIC BANDING  07/10/2010  . LAPAROSCOPIC GASTRIC BANDING     Laparoscopic adjustable banding APS System with posterior hiatal hernia, 2 suture.  Marland Kitchen LAPAROTOMY      for ruptured ovary and ovarian artery   . NEPHROLITHOTOMY Right 11/17/2017   Procedure: NEPHROLITHOTOMY PERCUTANEOUS;  Surgeon: Kathie Rhodes, MD;  [facility];  Service: Urology;  Laterality: Right;  . PACEMAKER INSERTION  03/10/2013   MDT dual chamber PPM  . POCKET REVISION N/A 12/08/2013   Procedure: POCKET REVISION;  Surgeon: Deboraha Sprang, MD;  [facility];  Service: Cardiovascular;  Laterality: N/A;  . PORTA CATH INSERTION    . REVERSE SHOULDER ARTHROPLASTY Right 05/14/2018   Procedure: RIGHT REVERSE SHOULDER ARTHROPLASTY;  Surgeon: Tania Ade, MD;  Location: [place];  Service: Orthopedics;  Laterality: Right;  . REVERSE SHOULDER REPLACEMENT Right 05/14/2018  . RIGHT HEART CATH N/A 07/21/2019   Procedure: RIGHT HEART CATH;  Surgeon: Larey Dresser, MD;  Location: [place] CV LAB;  Service: Cardiovascular;  Laterality: N/A;  . TEE WITH CARDIOVERSION  09/22/2017  . TEE WITHOUT CARDIOVERSION N/A 10/03/2017   Procedure: TRANSESOPHAGEAL ECHOCARDIOGRAM (TEE);  Surgeon: Sanda Karlena Luebke, MD;  Location: [facility] ENDOSCOPY;  Service: Cardiovascular;  Laterality: N/A;  . TEE WITHOUT CARDIOVERSION N/A 08/04/2019   Procedure: TRANSESOPHAGEAL ECHOCARDIOGRAM (TEE);  Surgeon: Larey Dresser, MD;  [facility] ENDOSCOPY;  Service: Cardiovascular;  Laterality: N/A;  . TEE WITHOUT CARDIOVERSION N/A 12/10/2019   Procedure: TRANSESOPHAGEAL ECHOCARDIOGRAM (TEE);  Surgeon: Buford Dresser, MD;  Location: [facility] ENDOSCOPY;  Service: Cardiovascular;  Laterality: N/A;  . THYROIDECTOMY  1998   Dr Margot Chimes  . TONSILLECTOMY    . TOTAL KNEE ARTHROPLASTY  04/13/2012   Procedure: TOTAL KNEE ARTHROPLASTY;  Surgeon: Rudean Haskell, MD;  Location: [place];  Service: Orthopedics;  Laterality: Right;  Marland Kitchen VIDEO BRONCHOSCOPY WITH ENDOBRONCHIAL ULTRASOUND N/A 02/07/2017   Procedure: VIDEO BRONCHOSCOPY WITH ENDOBRONCHIAL ULTRASOUND;  Surgeon: Marshell Garfinkel, MD;  Location: [place];  Service: Pulmonary;   Laterality: N/A;    Current Outpatient Medications  Medication Sig Dispense Refill  . acetaminophen (TYLENOL) 500 MG tablet Take 500-1,000 mg by mouth every 6 (six) hours as needed for mild pain or headache.    Marland Kitchen  amiodarone (PACERONE) 200 MG tablet Take '200mg'$  - 1 tablet twice daily x 2 weeks then begin '200mg'$  1 tablet daily. 30 tablet 0  . apixaban (ELIQUIS) 5 MG TABS tablet Take 5 mg by mouth 2 (two) times daily.    . calcium-vitamin D (OSCAL WITH D) 500-200 MG-UNIT tablet Take 2 tablets by mouth daily with breakfast. 180 tablet 1  . furosemide (LASIX) 20 MG tablet Take 20 mg by mouth daily as needed for fluid or edema.     Marland Kitchen levothyroxine (SYNTHROID) 137 MCG tablet Take 137 mcg by mouth See admin instructions. TAKE 1 TABLET (137 MCG) BY MOUTH DAILY, EXCEPT SATURDAYS    . Lifitegrast (XIIDRA) 5 % SOLN Place 1 drop into both eyes at bedtime.     . Multiple Vitamins-Minerals (ONE-A-DAY WOMENS 50+ ADVANTAGE) TABS Take 1 tablet by mouth daily with breakfast.    . rosuvastatin (CRESTOR) 10 MG tablet Take 10 mg by mouth 2 (two) times a week. Takes on Monday and Thursday    . ranolazine (RANEXA) 500 MG 12 hr tablet Take 1 tablet (500 mg total) by mouth 2 (two) times daily. 180 tablet 3   Current Facility-Administered Medications  Medication Dose Route Frequency Provider Last Rate Last Admin  . sodium chloride flush (NS) 0.9 % injection 3 mL  3 mL Intravenous Q12H Deboraha Sprang, MD        Allergies  Allergen Reactions  . Dofetilide Other (See Comments)    Prolonged QT interval   . Rivaroxaban Other (See Comments)    Nose Bleed X 6 hrs ; packing in ER Nasal hemorrhage  . Rivaroxaban     Nose Bleed X 6 hrs ; packing in ER Nasal hemorrhage  . Tape Other (See Comments)    SKIN IS THIN AND TEARS EASILY   . Tikosyn [Dofetilide] Other (See Comments)    "Long QT wave"   . Epinephrine Other (See Comments)    Oral anesthetic Dental form only (liquid). Patient stated she will become "out of  it" she can hear you but cannot respond in normal fashion. "not fully with it"  . Epinephrine   . Ketamine Other (See Comments)    Hallucinations   . Adhesive [Tape] Rash    Paper tape as well  . Ketamine Other (See Comments)    HALLUCINATIONS    Review of Systems negative except from HPI and PMH  Physical Exam BP 106/78   Pulse 71   Ht '5\' 6"'$  (1.676 m)   Wt 220 lb (99.8 kg)   SpO2 97%   BMI 35.51 kg/m  Well developed and well nourished in no acute distress HENT normal Neck supple with JVP-flat Clear Device pocket well healed; without hematoma or erythema.  There is no tethering  Regular rate and rhythm, 2/6 murmur Abd-soft with active BS No Clubbing cyanosis 1-2+ edema Skin-warm and dry A & Oriented  Grossly normal sensory and motor function  ECG atrial pacing at 71 Intervals 26/10/46    Assessment and  Plan  Atrial fibrillation/flutter  S/p convergent ablation persistent  Prior Strokes  Pacemaker Medtronic MRI compatible   Non-Hodgkin's lymphoma on chemotherapy  Dyspnea on exertion     Treated hypothyroidism  High Risk Medication Surveillance-amiodarone  Junctional rhythm   Orthostatic hypotension     Orthostasis is some better.  Dyspnea on exertion might be related to exuberant heart rates.  We undertook a walking trial in the office, finding that more rapid rates i.e. changing her ADL rate  90--100 was associated with worsening symptoms and a changing her threshold from medium low--medium high was associated with fewer symptoms and a heart rate in the mid 70s as opposed to a heart rate in the mid to high 80s.  On Anticoagulation;  No bleeding issues   The patient has raised the question as to whether amiodarone is associated with muscle weakness.  There is a scant literature to suggest myopathy.  For now we have few other options.  We will resume ranolazine as she was no better having stopped it.  It may augment the antiarrhythmic benefits of  amiodarone

## 2020-01-24 NOTE — Patient Instructions (Signed)
Medication Instructions:  Your physician has recommended you make the following change in your medication: Resume Ranolazine 500mg  by mouth twice daily.  Pt has this medication at home  Labwork: None ordered.  Testing/Procedures: None ordered.  Follow-Up: Your physician wants you to follow-up in: 4 months with Dr Caryl Comes.  You will receive a reminder letter in the mail two months in advance. If you don't receive a letter, please call our office to schedule the follow-up appointment.  Remote monitoring is used to monitor your Pacemaker of ICD from home. This monitoring reduces the number of office visits required to check your device to one time per year. It allows [place] to keep an eye on the functioning of your device to ensure it is working properly.   Any Other Special Instructions Will Be Listed Below (If Applicable).  If you need a refill on your cardiac medications before your next appointment, please call your pharmacy.

## 2020-01-24 NOTE — Therapy (Signed)
[facility] [address] [place], [place], [address] Phone: [phone]   Fax:  [phone]  Physical Therapy Treatment  Patient Details  Name: Kayla Harrison MRN: [id number] Date of Birth: 1946-06-26 Referring Provider (PT): Wendee Beavers   Encounter Date: 01/24/2020  PT End of Session - 01/24/20 1200    Visit Number  14    Number of Visits  20    Date for PT Re-Evaluation  02/10/20    Authorization Type  Medicare    PT Start Time  0848    PT Stop Time  0932    PT Time Calculation (min)  44 min    Equipment Utilized During Treatment  Gait belt    Activity Tolerance  Patient tolerated treatment well    Behavior During Therapy  [facility] for tasks assessed/performed       Past Medical History:  Diagnosis Date  . Anemia   . Arthritis    osteoarthritis - knees and right shoulder  . Atrial fibrillation [facility])    ablation- 2x's-- 1st time- [facility], 2nd event at [facility] in 2008. Convergent ablation at [facility] 6/14  . Atrial fibrillation ([place])   . Blood transfusion without reported diagnosis   . Breast cancer ([place])    Dr Margot Chimes, total thyroidectomy- 1999- for cancer  . 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 Chronic bilateral pleural effusions   . Colon polyp    Dr Earlean Shawl  . Complete heart block ([place])   . Complication of anesthesia    Ketamine produces LSD reaction, bright colored nightmarish experience   . Dyslipidemia   . Dyspnea   . Endometriosis   . Fibroids   . H/O pleural effusion    s/p thoracentesis w 3246m withdrawn  . Hepatitis    [place] ?hepatitis   . History of dysphagia    due to radiation therapy  . History of hiatal hernia    small noted on PET scan  . History of kidney stones   . Hx of thyroid cancer    Dr SForde Dandy . Hyperlipidemia   . Hypertension   . Hypothyroidism   . Lung cancer, lower lobe ([place] 01/2017   radiation RX completed 03/04/17; will start chemo 6/27, pt unaware of lung cancer   . Morbid obesity ([place]    Status post lap band surgery  . Nephrolithiasis   . Non Hodgkin's lymphoma ([place]    on chemotherapy  . Persistent atrial fibrillation ([place]    a. s/p PVI 2008 b. s/p convergent ablation 26256complicated by bradycardia requiring pacemaker implant  . Personal history of radiation therapy   . Presence of permanent cardiac pacemaker   . Rotator cuff tear    Right  . Sinus node dysfunction ([facility]    Complicating convergent ablation 6/14  . [place]    [place]  . SVC syndrome    with lung mass and non hodgkins lymphoma  . Thyroid cancer ([place]    Past Surgical History:  Procedure Laterality Date  . ABDOMINAL HYSTERECTOMY  1983  . afib ablation     a. 2008 PVI b. 2014 convergent ablation  . APPENDECTOMY    . BONE MARROW BIOPSY  02/21/2017  . BREAST LUMPECTOMY Left 2010  . bso  1998  . CARDIAC CATHETERIZATION     2015- negative  . CARDIOVERSION  10/09/2012   Procedure: CARDIOVERSION;  Surgeon: JMinus Breeding MD;  [place]  Service: Cardiovascular;  Laterality: N/A;  . CARDIOVERSION  10/09/2012   Procedure: CARDIOVERSION;  Surgeon: Minus Breeding, MD;  Location: [facility] ENDOSCOPY;  Service: Cardiovascular;  Laterality: N/A;  [name] gave the ok to add pt to the add on , but we must check to find out if the can add pt on at 1400 ( [phone])  . CARDIOVERSION N/A 11/20/2012   Procedure: CARDIOVERSION;  Surgeon: Fay Records, MD;  Location: [place];  Service: Cardiovascular;  Laterality: N/A;  . CARDIOVERSION N/A 07/18/2017   Procedure: CARDIOVERSION;  Surgeon: Thayer Headings, MD;  Location[facility] ENDOSCOPY;  Service: Cardiovascular;  Laterality: N/A;  . CARDIOVERSION N/A 10/03/2017   Procedure: CARDIOVERSION;  Surgeon: Sanda Klein, MD;  Location: [facility] ENDOSCOPY;  Service: Cardiovascular;  Laterality: N/A;  . CARDIOVERSION N/A 01/07/2018   Procedure: CARDIOVERSION;  Surgeon: Thayer Headings, MD;  Location: [facility] ENDOSCOPY;  Service:  Cardiovascular;  Laterality: N/A;  . CARDIOVERSION N/A 12/10/2019   Procedure: CARDIOVERSION;  Surgeon: Buford Dresser, MD;  Location: [facility] ENDOSCOPY;  Service: Cardiovascular;  Laterality: N/A;  . CHOLECYSTECTOMY    . COLONOSCOPY W/ POLYPECTOMY     Dr Earlean Shawl  . CYSTOSCOPY N/A 02/06/2015   Procedure: CYSTOSCOPY;  Surgeon: Kathie Rhodes, MD;  Location: [facility] ORS;  Service: Urology;  Laterality: N/A;  . CYSTOSCOPY W/ RETROGRADES Left 11/17/2017   Procedure: CYSTOSCOPY WITH RETROGRADE /PYELOGRAM/;  Surgeon: Kathie Rhodes, MD;  Location: [facility];  Service: Urology;  Laterality: Left;  . CYSTOSCOPY WITH RETROGRADE PYELOGRAM, URETEROSCOPY AND STENT PLACEMENT Right 02/06/2015   Procedure: RETROGRADE PYELOGRAM, RIGHT URETEROSCOPY STENT PLACEMENT;  Surgeon: Kathie Rhodes, MD;  Location: [facility];  Service: Urology;  Laterality: Right;  . CYSTOSCOPY WITH RETROGRADE PYELOGRAM, URETEROSCOPY AND STENT PLACEMENT Right 03/07/2017   Procedure: CYSTOSCOPY WITH RIGHT RETROGRADE PYELOGRAM,RIGHT  URETEROSCOPYLASER LITHOTRIPSY  AND STENT PLACEMENT AND STONE BASKETRY;  Surgeon: Kathie Rhodes, MD;  Location: [place];  Service: Urology;  Laterality: Right;  . EYE SURGERY     cataract surgery  . fatty mass removal  1999   pubic area  . HOLMIUM LASER APPLICATION N/A 05/21/5620   Procedure: HOLMIUM LASER APPLICATION;  Surgeon: Kathie Rhodes, MD;  [facility]: [facility];  Service: Urology;  Laterality: N/A;  . HOLMIUM LASER APPLICATION Right 11/28/6576   Procedure: HOLMIUM LASER APPLICATION;  Surgeon: Kathie Rhodes, MD;  [facility];  Service: Urology;  Laterality: Right;  . HOLMIUM LASER APPLICATION Left 4/69/6295   Procedure: HOLMIUM LASER APPLICATION;  Surgeon: Kathie Rhodes, MD;  [facility]: [facility];  Service: Urology;  Laterality: Left;  . IR FLUORO GUIDE PORT INSERTION RIGHT  02/24/2017  . IR NEPHROSTOMY PLACEMENT RIGHT  11/17/2017  . IR PATIENT EVAL TECH 0-60 MINS  03/11/2017  . IR REMOVAL  TUN ACCESS W/ PORT W/O FL MOD SED  04/20/2018  . IR US GUIDE VASC ACCESS RIGHT  02/24/2017  . KNEE ARTHROSCOPY     bilateral  . LAPAROSCOPIC GASTRIC BANDING  07/10/2010  . LAPAROSCOPIC GASTRIC BANDING     Laparoscopic adjustable banding APS System with posterior hiatal hernia, 2 suture.  Marland Kitchen LAPAROTOMY     for ruptured ovary and ovarian artery   . NEPHROLITHOTOMY Right 11/17/2017   Procedure: NEPHROLITHOTOMY PERCUTANEOUS;  Surgeon: Kathie Rhodes, MD;  [facility];  Service: Urology;  Laterality: Right;  . PACEMAKER INSERTION  03/10/2013   MDT dual chamber PPM  . POCKET REVISION N/A 12/08/2013   Procedure: POCKET REVISION;  Surgeon: Deboraha Sprang, MD;  Location[facility] CATH  LAB;  Service: Cardiovascular;  Laterality: N/A;  . PORTA CATH INSERTION    . REVERSE SHOULDER ARTHROPLASTY Right 05/14/2018   Procedure: RIGHT REVERSE SHOULDER ARTHROPLASTY;  Surgeon: Tania Ade, MD;  Location: [place];  Service: Orthopedics;  Laterality: Right;  . REVERSE SHOULDER REPLACEMENT Right 05/14/2018  . RIGHT HEART CATH N/A 07/21/2019   Procedure: RIGHT HEART CATH;  Surgeon: Larey Dresser, MD;  Location: [place] CV LAB;  Service: Cardiovascular;  Laterality: N/A;  . TEE WITH CARDIOVERSION  09/22/2017  . TEE WITHOUT CARDIOVERSION N/A 10/03/2017   Procedure: TRANSESOPHAGEAL ECHOCARDIOGRAM (TEE);  Surgeon: Sanda Klein, MD;  Location: [facility] ENDOSCOPY;  Service: Cardiovascular;  Laterality: N/A;  . TEE WITHOUT CARDIOVERSION N/A 08/04/2019   Procedure: TRANSESOPHAGEAL ECHOCARDIOGRAM (TEE);  Surgeon: Larey Dresser, MD;  Location: [facility] ENDOSCOPY;  Service: Cardiovascular;  Laterality: N/A;  . TEE WITHOUT CARDIOVERSION N/A 12/10/2019   Procedure: TRANSESOPHAGEAL ECHOCARDIOGRAM (TEE);  Surgeon: Buford Dresser, MD;  Location: [facility] ENDOSCOPY;  Service: Cardiovascular;  Laterality: N/A;  . THYROIDECTOMY  1998   Dr Margot Chimes  . TONSILLECTOMY    . TOTAL KNEE ARTHROPLASTY  04/13/2012   Procedure: TOTAL KNEE  ARTHROPLASTY;  Surgeon: Rudean Haskell, MD;  Location: [place];  Service: Orthopedics;  Laterality: Right;  Marland Kitchen VIDEO BRONCHOSCOPY WITH ENDOBRONCHIAL ULTRASOUND N/A 02/07/2017   Procedure: VIDEO BRONCHOSCOPY WITH ENDOBRONCHIAL ULTRASOUND;  Surgeon: Marshell Garfinkel, MD;  Location: [place];  Service: Pulmonary;  Laterality: N/A;    There were no vitals filed for this visit.  Subjective Assessment - 01/24/20 1159    Subjective  Pt states doing well with walking at home, using SPC most of the time now. States soreness after DN, but also has had significant pain relief in shoulder blade region.    Currently in Pain?  No/denies    Pain Score  0-No pain         [facility] PT Assessment - 01/24/20 0001      Berg Balance Test   Sit to Stand  Able to stand without using hands and stabilize independently    Standing Unsupported  Able to stand safely 2 minutes    Sitting with Back Unsupported but Feet Supported on Floor or Stool  Able to sit safely and securely 2 minutes    Stand to Sit  Sits safely with minimal use of hands    Transfers  Able to transfer safely, minor use of hands    Standing Unsupported with Eyes Closed  Able to stand 10 seconds safely    Standing Unsupported with Feet Together  Able to place feet together independently and stand 1 minute safely    From Standing, Reach Forward with Outstretched Arm  Can reach confidently >25 cm (10")    From Standing Position, Pick up Object from Floor  Able to pick up shoe safely and easily    From Standing Position, Turn to Look Behind Over each Shoulder  Looks behind one side only/other side shows less weight shift    Turn 360 Degrees  Able to turn 360 degrees safely but slowly    Standing Unsupported, Alternately Place Feet on Step/Stool  Able to complete 4 steps without aid or supervision    Standing Unsupported, One Foot in Front  Able to take small step independently and hold 30 seconds    Standing on One Leg  Tries to lift leg/unable to hold 3  seconds but remains standing independently    Total Score  46  [facility] Adult PT Treatment/Exercise - 01/24/20 0001      Ambulation/Gait   Gait Comments  35 ft x 8 with SPC ,   Side stepping 25 ft x4 with SPC       Knee/Hip Exercises: Standing   Hip Flexion  20 reps;Knee bent    Hip Abduction  2 sets;10 reps    Stairs  up/down 5 steps, 1 hand rail, SPC. reciprocol, x2,      Other Standing Knee Exercises  toe taps x20 with SPC no UE support on 6 in step     Other Standing Knee Exercises  Lateral stepping w weight shifts / SPC x 15;                PT Short Term Goals - 01/13/20 1330      PT SHORT TERM GOAL #1   Title  Pt to be independent with initial HEP for LE strength    Time  2    Period  Weeks    Status  Achieved    Target Date  12/22/19      PT SHORT TERM GOAL #2   Title  Pt with be compliant with safe use of 4 WW at all times, for improved safety    Time  2    Period  Weeks    Status  Achieved    Target Date  12/22/19        PT Long Term Goals - 01/13/20 1331      PT LONG TERM GOAL #1   Title  Pt to be independent with final HEP, for strength and balance    Time  6    Period  Weeks    Status  On-going    Target Date  02/10/20      PT LONG TERM GOAL #2   Title  Pt to demo improved score on BERG by at least 5 points, to improve balance and safety    Time  6    Period  Weeks    Status  Partially Met    Target Date  02/10/20      PT LONG TERM GOAL #3   Title  Pt to demo improved LE strength to at least 4+/5 to improve stability and endurance.    Time  6    Period  Weeks    Status  Partially Met    Target Date  02/10/20      PT LONG TERM GOAL #4   Title  Pt to demo ability for ambulation with LRAD, for at least 500 ft, with direction changes and dynamic stability to be [facility] for pt age.    Time  6    Period  Weeks    Status  Partially Met    Target Date  02/10/20            Plan - 01/24/20 1201    Clinical  Impression Statement  Pt showing improvments with balance and stability with dynamic activity, and walking. Able to use [facility] for most activity now. Would still recommend that she use RW for outdoor or longer walking. Much improved score on BERG today. She is challenged with endurance, with standing activity and walking today, and still requires seated rest breaks for this. Plan to work towards d/c in 1-2 weeks.    Personal Factors and Comorbidities  Comorbidity 1;Comorbidity 2    Comorbidities  A-Fib, dizziness, frequent falls, OA, Neuropathy    Examination-Activity Limitations  Bathing;Dressing;Hygiene/Grooming;Bend;Lift;Squat;Stairs;Locomotion Level  Examination-Participation Restrictions  Laundry;Cleaning;Shop;Community Activity;Meal Prep    Rehab Potential  Good    PT Frequency  2x / week    PT Duration  6 weeks    PT Treatment/Interventions  ADLs/Self Care Home Management;DME Instruction;Gait training;Stair training;Functional mobility training;Therapeutic activities;Therapeutic exercise;Balance training;Neuromuscular re-education;Patient/family education;Orthotic Fit/Training;Scar mobilization;Passive range of motion;Dry needling;Vestibular;Manual techniques;Canalith Repostioning;Cryotherapy;Electrical Stimulation;Iontophoresis '4mg'$ /ml Dexamethasone;Moist Heat    Consulted and Agree with Plan of Care  Patient       Patient will benefit from skilled therapeutic intervention in order to improve the following deficits and impairments:  Abnormal gait, Decreased balance, Decreased mobility, Difficulty walking, Dizziness, Decreased knowledge of precautions, Cardiopulmonary status limiting activity, Decreased activity tolerance, Decreased coordination, Decreased safety awareness, Decreased strength, Pain  Visit Diagnosis: Other abnormalities of gait and mobility     Problem List Patient Active Problem List   Diagnosis Date Noted  . Pacemaker 01/23/2020  . Junctional rhythm 01/23/2020  .  Palpitations 01/23/2020  . ICH (intracerebral hemorrhage) ([place]) 12/02/2019  . A-fib ([place]) 12/02/2019  . Anemia 12/02/2019  . QT prolongation 12/02/2019  . Hypothyroidism 12/02/2019  . Gait abnormality 07/10/2018  . S/P reverse total shoulder arthroplasty, 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  . Persistent atrial fibrillation ([place])   . Bilateral ureteral calculi 11/17/2017  . Atrial fibrillation with RVR ([place]) 09/15/2017  . Atrial flutter ([place]) 07/17/2017  . Port catheter in place 04/02/2017  . Non-Hodgkin lymphoma, unspecified, intrathoracic lymph nodes ([place]) 02/13/2017  . Mediastinal mass 02/06/2017  . 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 Abnormal chest x-ray   . Lung mass 02/03/2017  . 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  . OSA (obstructive sleep apnea) 02/03/2015  . History of renal calculi 11/16/2014  . Renal calculi 11/16/2014  . Family history of colon cancer 10/26/2014  . Mechanical complication due to cardiac pacemaker pulse generator 11/06/2013  . Dyspnea 05/12/2013  . Hypothyroidism 04/21/2013  . Pleural effusion 04/19/2013  . Long term (current) use of anticoagulants 12/20/2010  . EPIDERMOID CYST 08/22/2010  . Chronic diastolic heart failure ([place]) 08/06/2010  . SYNCOPE AND COLLAPSE 07/27/2010  . OSTEOARTHRITIS, KNEE, RIGHT 03/15/2010  . Morbid obesity ([place]) 12/07/2009  . NONSPEC ELEVATION OF LEVELS OF TRANSAMINASE/LDH 09/08/2009  . BREAST CANCER, HX OF 07/25/2009  . COLONIC POLYPS, HX OF 07/25/2009  . TUBULOVILLOUS ADENOMA, COLON 04/29/2008  . HYPERGLYCEMIA, FASTING 04/29/2008  . HYPERLIPIDEMIA 06/22/2007  . CARCINOMA, THYROID GLAND, HX OF 06/22/2007    Lyndee Hensen, PT, DPT 12:03 PM  01/24/20    [place], [place], [address] Phone: [phone]   Fax:  [phone]  Name: Kayla Harrison MRN: [id number] Date of Birth: 1945-12-11

## 2020-01-25 NOTE — Telephone Encounter (Signed)
Spoke with pt during appointment with dr Caryl Comes.  Pt states she is not receiving responses to her MyChart messages.  Printed copy of recent MyChart communications and gave to pt.  Advised pt she should contact IT at number on MyChart re: issue she is having receiving messages.  Reiterated with pt process for pre-op clearance related to her pending dental procedure.  Pt verbalizes understanding and agrees with current plan.

## 2020-01-26 ENCOUNTER — Ambulatory Visit (INDEPENDENT_AMBULATORY_CARE_PROVIDER_SITE_OTHER): Payer: Medicare Other | Admitting: Physical Therapy

## 2020-01-26 ENCOUNTER — Other Ambulatory Visit: Payer: Self-pay

## 2020-01-26 DIAGNOSIS — R2689 Other abnormalities of gait and mobility: Secondary | ICD-10-CM

## 2020-01-27 ENCOUNTER — Encounter: Payer: Self-pay | Admitting: Physical Therapy

## 2020-01-27 NOTE — Telephone Encounter (Signed)
Phone call to Dr Landry Corporal office and spoke with Marzetta Board who states pre-op clearance form was faxed to [phone] on 01/04/2020.  Requested that she refax clearance request to [phone] attention pre-op clearance.

## 2020-01-27 NOTE — Therapy (Signed)
[facility] [address] [place], [place], [address] Phone: [phone]   Fax:  [phone]  Physical Therapy Treatment  Patient Details  Name: Kayla Harrison MRN: [id number] Date of Birth: [birth year] Referring Provider (PT): Wendee Beavers   Encounter Date: 01/26/2020  PT End of Session - 01/27/20 1208    Visit Number  15    Number of Visits  20    Date for PT Re-Evaluation  02/10/20    Authorization Type  Medicare    PT Start Time  8768    PT Stop Time  1100    PT Time Calculation (min)  45 min    Equipment Utilized During Treatment  Gait belt    Activity Tolerance  Patient tolerated treatment well    Behavior During Therapy  [facility] for tasks assessed/performed       Past Medical History:  Diagnosis Date  . Anemia   . Arthritis    osteoarthritis - knees and right shoulder  . Atrial fibrillation [facility])    ablation- 2x's-- 1st time- [facility], 2nd event at [facility] in 2008. Convergent ablation at [facility] 6/14  . Atrial fibrillation ([place])   . Blood transfusion without reported diagnosis   . Breast cancer ([place])    Dr Margot Chimes, total thyroidectomy- 1999- for cancer  . 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 Chronic bilateral pleural effusions   . Colon polyp    Dr Earlean Shawl  . Complete heart block ([place])   . Complication of anesthesia    Ketamine produces LSD reaction, bright colored nightmarish experience   . Dyslipidemia   . Dyspnea   . Endometriosis   . Fibroids   . H/O pleural effusion    s/p thoracentesis w 3218m withdrawn  . Hepatitis    [place] ?hepatitis   . History of dysphagia    due to radiation therapy  . History of hiatal hernia    small noted on PET scan  . History of kidney stones   . Hx of thyroid cancer    Dr SForde Dandy . Hyperlipidemia   . Hypertension   . Hypothyroidism   . Lung cancer, lower lobe ([place] 01/2017   radiation RX completed 03/04/17; will start chemo 6/27, pt unaware of lung cancer   . Morbid obesity ([place]    Status post lap band surgery  . Nephrolithiasis   . Non Hodgkin's lymphoma ([place]    on chemotherapy  . Persistent atrial fibrillation ([place]    a. s/p PVI 2008 b. s/p convergent ablation 21157complicated by bradycardia requiring pacemaker implant  . Personal history of radiation therapy   . Presence of permanent cardiac pacemaker   . Rotator cuff tear    Right  . Sinus node dysfunction ([place]    Complicating convergent ablation 6/14  . [place]    [place]  . SVC syndrome    with lung mass and non hodgkins lymphoma  . Thyroid cancer ([place] 2000    Past Surgical History:  Procedure Laterality Date  . ABDOMINAL HYSTERECTOMY  1983  . afib ablation     a. 2008 PVI b. 2014 convergent ablation  . APPENDECTOMY    . BONE MARROW BIOPSY  02/21/2017  . BREAST LUMPECTOMY Left 2010  . bso  1998  . CARDIAC CATHETERIZATION     2015- negative  . CARDIOVERSION  10/09/2012   Procedure: CARDIOVERSION;  Surgeon: JMinus Breeding MD;  [place]  Service: Cardiovascular;  Laterality: N/A;  . CARDIOVERSION  10/09/2012   Procedure: CARDIOVERSION;  Surgeon: Minus Breeding, MD;  Location: [facility] ENDOSCOPY;  Service: Cardiovascular;  Laterality: N/A;  [name] gave the ok to add pt to the add on , but we must check to find out if the can add pt on at 1400 ( [phone])  . CARDIOVERSION N/A 11/20/2012   Procedure: CARDIOVERSION;  Surgeon: Fay Records, MD;  Location: [place];  Service: Cardiovascular;  Laterality: N/A;  . CARDIOVERSION N/A 07/18/2017   Procedure: CARDIOVERSION;  Surgeon: Thayer Headings, MD;  Location[facility] ENDOSCOPY;  Service: Cardiovascular;  Laterality: N/A;  . CARDIOVERSION N/A 10/03/2017   Procedure: CARDIOVERSION;  Surgeon: Sanda Klein, MD;  Location: [facility] ENDOSCOPY;  Service: Cardiovascular;  Laterality: N/A;  . CARDIOVERSION N/A 01/07/2018   Procedure: CARDIOVERSION;  Surgeon: Thayer Headings, MD;  Location: [facility] ENDOSCOPY;  Service:  Cardiovascular;  Laterality: N/A;  . CARDIOVERSION N/A 12/10/2019   Procedure: CARDIOVERSION;  Surgeon: Buford Dresser, MD;  Location: [facility] ENDOSCOPY;  Service: Cardiovascular;  Laterality: N/A;  . CHOLECYSTECTOMY    . COLONOSCOPY W/ POLYPECTOMY     Dr Earlean Shawl  . CYSTOSCOPY N/A 02/06/2015   Procedure: CYSTOSCOPY;  Surgeon: Kathie Rhodes, MD;  Location: [facility] ORS;  Service: Urology;  Laterality: N/A;  . CYSTOSCOPY W/ RETROGRADES Left 11/17/2017   Procedure: CYSTOSCOPY WITH RETROGRADE /PYELOGRAM/;  Surgeon: Kathie Rhodes, MD;  Location: [facility];  Service: Urology;  Laterality: Left;  . CYSTOSCOPY WITH RETROGRADE PYELOGRAM, URETEROSCOPY AND STENT PLACEMENT Right 02/06/2015   Procedure: RETROGRADE PYELOGRAM, RIGHT URETEROSCOPY STENT PLACEMENT;  Surgeon: Kathie Rhodes, MD;  Location: [facility];  Service: Urology;  Laterality: Right;  . CYSTOSCOPY WITH RETROGRADE PYELOGRAM, URETEROSCOPY AND STENT PLACEMENT Right 03/07/2017   Procedure: CYSTOSCOPY WITH RIGHT RETROGRADE PYELOGRAM,RIGHT  URETEROSCOPYLASER LITHOTRIPSY  AND STENT PLACEMENT AND STONE BASKETRY;  Surgeon: Kathie Rhodes, MD;  Location: [place];  Service: Urology;  Laterality: Right;  . EYE SURGERY     cataract surgery  . fatty mass removal  1999   pubic area  . HOLMIUM LASER APPLICATION N/A 05/21/5620   Procedure: HOLMIUM LASER APPLICATION;  Surgeon: Kathie Rhodes, MD;  [facility]: [facility];  Service: Urology;  Laterality: N/A;  . HOLMIUM LASER APPLICATION Right 11/28/6576   Procedure: HOLMIUM LASER APPLICATION;  Surgeon: Kathie Rhodes, MD;  Location[facility];  Service: Urology;  Laterality: Right;  . HOLMIUM LASER APPLICATION Left 4/69/6295   Procedure: HOLMIUM LASER APPLICATION;  Surgeon: Kathie Rhodes, MD;  Location: [facility];  Service: Urology;  Laterality: Left;  . IR FLUORO GUIDE PORT INSERTION RIGHT  02/24/2017  . IR NEPHROSTOMY PLACEMENT RIGHT  11/17/2017  . IR PATIENT EVAL TECH 0-60 MINS  03/11/2017  . IR REMOVAL  TUN ACCESS W/ PORT W/O FL MOD SED  04/20/2018  . IR US GUIDE VASC ACCESS RIGHT  02/24/2017  . KNEE ARTHROSCOPY     bilateral  . LAPAROSCOPIC GASTRIC BANDING  07/10/2010  . LAPAROSCOPIC GASTRIC BANDING     Laparoscopic adjustable banding APS System with posterior hiatal hernia, 2 suture.  Marland Kitchen LAPAROTOMY     for ruptured ovary and ovarian artery   . NEPHROLITHOTOMY Right 11/17/2017   Procedure: NEPHROLITHOTOMY PERCUTANEOUS;  Surgeon: Kathie Rhodes, MD;  [facility];  Service: Urology;  Laterality: Right;  . PACEMAKER INSERTION  03/10/2013   MDT dual chamber PPM  . POCKET REVISION N/A 12/08/2013   Procedure: POCKET REVISION;  Surgeon: Deboraha Sprang, MD;  [facility] CATH  LAB;  Service: Cardiovascular;  Laterality: N/A;  . PORTA CATH INSERTION    . REVERSE SHOULDER ARTHROPLASTY Right 05/14/2018   Procedure: RIGHT REVERSE SHOULDER ARTHROPLASTY;  Surgeon: Tania Ade, MD;  Location: [place];  Service: Orthopedics;  Laterality: Right;  . REVERSE SHOULDER REPLACEMENT Right 05/14/2018  . RIGHT HEART CATH N/A 07/21/2019   Procedure: RIGHT HEART CATH;  Surgeon: Larey Dresser, MD;  Location: [place] CV LAB;  Service: Cardiovascular;  Laterality: N/A;  . TEE WITH CARDIOVERSION  09/22/2017  . TEE WITHOUT CARDIOVERSION N/A 10/03/2017   Procedure: TRANSESOPHAGEAL ECHOCARDIOGRAM (TEE);  Surgeon: Sanda Klein, MD;  Location: [facility] ENDOSCOPY;  Service: Cardiovascular;  Laterality: N/A;  . TEE WITHOUT CARDIOVERSION N/A 08/04/2019   Procedure: TRANSESOPHAGEAL ECHOCARDIOGRAM (TEE);  Surgeon: Larey Dresser, MD;  Location: [facility] ENDOSCOPY;  Service: Cardiovascular;  Laterality: N/A;  . TEE WITHOUT CARDIOVERSION N/A 12/10/2019   Procedure: TRANSESOPHAGEAL ECHOCARDIOGRAM (TEE);  Surgeon: Buford Dresser, MD;  Location: [facility] ENDOSCOPY;  Service: Cardiovascular;  Laterality: N/A;  . THYROIDECTOMY  1998   Dr Margot Chimes  . TONSILLECTOMY    . TOTAL KNEE ARTHROPLASTY  04/13/2012   Procedure: TOTAL KNEE  ARTHROPLASTY;  Surgeon: Rudean Haskell, MD;  Location: [place];  Service: Orthopedics;  Laterality: Right;  Marland Kitchen VIDEO BRONCHOSCOPY WITH ENDOBRONCHIAL ULTRASOUND N/A 02/07/2017   Procedure: VIDEO BRONCHOSCOPY WITH ENDOBRONCHIAL ULTRASOUND;  Surgeon: Marshell Garfinkel, MD;  Location: [place];  Service: Pulmonary;  Laterality: N/A;    There were no vitals filed for this visit.  Subjective Assessment - 01/27/20 1208    Subjective  Pt states she has been a bit more tired in last 2 days, she had her pace maker re-programmed. Feels more off balance when tired.    Currently in Pain?  No/denies    Pain Score  0-No pain                                 PT Short Term Goals - 01/13/20 1330      PT SHORT TERM GOAL #1   Title  Pt to be independent with initial HEP for LE strength    Time  2    Period  Weeks    Status  Achieved    Target Date  12/22/19      PT SHORT TERM GOAL #2   Title  Pt with be compliant with safe use of 4 WW at all times, for improved safety    Time  2    Period  Weeks    Status  Achieved    Target Date  12/22/19        PT Long Term Goals - 01/13/20 1331      PT LONG TERM GOAL #1   Title  Pt to be independent with final HEP, for strength and balance    Time  6    Period  Weeks    Status  On-going    Target Date  02/10/20      PT LONG TERM GOAL #2   Title  Pt to demo improved score on BERG by at least 5 points, to improve balance and safety    Time  6    Period  Weeks    Status  Partially Met    Target Date  02/10/20      PT LONG TERM GOAL #3   Title  Pt to demo improved LE strength to at least 4+/5  to improve stability and endurance.    Time  6    Period  Weeks    Status  Partially Met    Target Date  02/10/20      PT LONG TERM GOAL #4   Title  Pt to demo ability for ambulation with LRAD, for at least 500 ft, with direction changes and dynamic stability to be [facility] for pt age.    Time  6    Period  Weeks    Status  Partially Met     Target Date  02/10/20            Plan - 01/27/20 1210    Clinical Impression Statement  Scapular pain on R resolved after dry needling, she does have soreness/tenderness in R UT with palpation and STM today. Pt with stable HR today at 70-72 during session and activity. She does have mild dizziness with position changes of sit to and from supine, that resolve in less than 1 minute. Pt with good understanding to monitor this. Pt with slightly more fatigue today from previous sessions, requiring more rest. She is slightly less stable when fatigued. Plan to see pt for 1 more week, and progress to d/c.    Personal Factors and Comorbidities  Comorbidity 1;Comorbidity 2    Comorbidities  A-Fib, dizziness, frequent falls, OA, Neuropathy    Examination-Activity Limitations  Bathing;Dressing;Hygiene/Grooming;Bend;Lift;Squat;Stairs;Locomotion Level    Examination-Participation Restrictions  Laundry;Cleaning;Shop;Community Activity;Meal Prep    Rehab Potential  Good    PT Frequency  2x / week    PT Duration  6 weeks    PT Treatment/Interventions  ADLs/Self Care Home Management;DME Instruction;Gait training;Stair training;Functional mobility training;Therapeutic activities;Therapeutic exercise;Balance training;Neuromuscular re-education;Patient/family education;Orthotic Fit/Training;Scar mobilization;Passive range of motion;Dry needling;Vestibular;Manual techniques;Canalith Repostioning;Cryotherapy;Electrical Stimulation;Iontophoresis 18m/ml Dexamethasone;Moist Heat    Consulted and Agree with Plan of Care  Patient       Patient will benefit from skilled therapeutic intervention in order to improve the following deficits and impairments:  Abnormal gait, Decreased balance, Decreased mobility, Difficulty walking, Dizziness, Decreased knowledge of precautions, Cardiopulmonary status limiting activity, Decreased activity tolerance, Decreased coordination, Decreased safety awareness, Decreased strength,  Pain  Visit Diagnosis: Other abnormalities of gait and mobility     Problem List Patient Active Problem List   Diagnosis Date Noted  . Pacemaker 01/23/2020  . Junctional rhythm 01/23/2020  . Palpitations 01/23/2020  . ICH (intracerebral hemorrhage) ([place] 12/02/2019  . A-fib ([place] 12/02/2019  . Anemia 12/02/2019  . QT prolongation 12/02/2019  . Hypothyroidism 12/02/2019  . Gait abnormality 07/10/2018  . S/P reverse total shoulder arthroplasty, 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  . Persistent atrial fibrillation ([place]   . Bilateral ureteral calculi 11/17/2017  . Atrial fibrillation with RVR ([place] 09/15/2017  . Atrial flutter ([place] 07/17/2017  . Port catheter in place 04/02/2017  . Non-Hodgkin lymphoma, unspecified, intrathoracic lymph nodes ([place] 02/13/2017  . Mediastinal mass 02/06/2017  . 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[place] 02/06/2017  . Abnormal chest x-ray   . Lung mass 02/03/2017  . 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  . OSA (obstructive sleep apnea) 02/03/2015  . History of renal calculi 11/16/2014  . Renal calculi 11/16/2014  . Family history of colon cancer 10/26/2014  . Mechanical complication due to cardiac pacemaker pulse generator 11/06/2013  . Dyspnea 05/12/2013  . Hypothyroidism 04/21/2013  . Pleural effusion 04/19/2013  . Long term (current) use of anticoagulants 12/20/2010  . EPIDERMOID CYST 08/22/2010  . Chronic diastolic heart failure ([place] 08/06/2010  . SYNCOPE AND COLLAPSE 07/27/2010  .  OSTEOARTHRITIS, KNEE, RIGHT 03/15/2010  . Morbid obesity ([place]) 12/07/2009  . NONSPEC ELEVATION OF LEVELS OF TRANSAMINASE/LDH 09/08/2009  . BREAST CANCER, HX OF 07/25/2009  . COLONIC POLYPS, HX OF 07/25/2009  . TUBULOVILLOUS ADENOMA, COLON 04/29/2008  . HYPERGLYCEMIA, FASTING 04/29/2008  . HYPERLIPIDEMIA 06/22/2007  . CARCINOMA, THYROID GLAND, HX OF 06/22/2007    Lyndee Hensen, PT, DPT 12:12 PM  01/27/20    [place], [place], [address] Phone: [phone]   Fax:  [phone]  Name: Kayla Harrison MRN: [id number] Date of Birth: 10-18-1945

## 2020-01-31 ENCOUNTER — Other Ambulatory Visit: Payer: Self-pay

## 2020-01-31 ENCOUNTER — Ambulatory Visit (INDEPENDENT_AMBULATORY_CARE_PROVIDER_SITE_OTHER): Payer: Medicare Other | Admitting: Physical Therapy

## 2020-01-31 ENCOUNTER — Encounter: Payer: Self-pay | Admitting: Physical Therapy

## 2020-01-31 DIAGNOSIS — R2689 Other abnormalities of gait and mobility: Secondary | ICD-10-CM | POA: Diagnosis not present

## 2020-01-31 NOTE — Therapy (Signed)
[facility] [address] [place], [place], [address] Phone: [phone]   Fax:  [phone]  Physical Therapy Treatment  Patient Details  Name: Kayla Harrison MRN: [id number] Date of Birth: Sep 20, 1946 Referring Provider (PT): Wendee Beavers   Encounter Date: 01/31/2020  PT End of Session - 01/31/20 1332    Visit Number  16    Number of Visits  20    Date for PT Re-Evaluation  02/10/20    Authorization Type  Medicare    PT Start Time  1020    PT Stop Time  1105    PT Time Calculation (min)  45 min    Equipment Utilized During Treatment  Gait belt    Activity Tolerance  Patient tolerated treatment well    Behavior During Therapy  [facility] for tasks assessed/performed       Past Medical History:  Diagnosis Date  . Anemia   . Arthritis    osteoarthritis - knees and right shoulder  . Atrial fibrillation [facility])    ablation- 2x's-- 1st time- [facility], 2nd event at [facility] in 2008. Convergent ablation at [facility] 6/14  . Atrial fibrillation ([place])   . Blood transfusion without reported diagnosis   . Breast cancer ([place])    Dr Margot Chimes, total thyroidectomy- 1999- for cancer  . 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 Chronic bilateral pleural effusions   . Colon polyp    Dr Earlean Shawl  . Complete heart block ([place])   . Complication of anesthesia    Ketamine produces LSD reaction, bright colored nightmarish experience   . Dyslipidemia   . Dyspnea   . Endometriosis   . Fibroids   . H/O pleural effusion    s/p thoracentesis w 3244m withdrawn  . Hepatitis    [place] ?hepatitis   . History of dysphagia    due to radiation therapy  . History of hiatal hernia    small noted on PET scan  . History of kidney stones   . Hx of thyroid cancer    Dr SForde Dandy . Hyperlipidemia   . Hypertension   . Hypothyroidism   . Lung cancer, [place] lobe ([place] 01[place]   radiation RX completed 03/04/17; will start chemo 6/27, pt unaware of lung cancer   . Morbid obesity ([place]    Status post lap band surgery  . Nephrolithiasis   . Non Hodgkin's lymphoma ([place]    on chemotherapy  . Persistent atrial fibrillation ([place]    a. s/p PVI 2008 b. s/p convergent ablation 24801complicated by bradycardia requiring pacemaker implant  . Personal history of radiation therapy   . Presence of permanent cardiac pacemaker   . Rotator cuff tear    Right  . Sinus node dysfunction ([place]    Complicating convergent ablation 6/14  . [place]    [place]  . SVC syndrome    with lung mass and non hodgkins lymphoma  . Thyroid cancer ([place] 2000    Past Surgical History:  Procedure Laterality Date  . ABDOMINAL HYSTERECTOMY  1983  . afib ablation     a. 2008 PVI b. 2014 convergent ablation  . APPENDECTOMY    . BONE MARROW BIOPSY  02/21/2017  . BREAST LUMPECTOMY Left 2010  . bso  1998  . CARDIAC CATHETERIZATION     2015- negative  . CARDIOVERSION  10/09/2012   Procedure: CARDIOVERSION;  Surgeon: JMinus Breeding MD;  [place]  Service: Cardiovascular;  Laterality: N/A;  . CARDIOVERSION  10/09/2012   Procedure: CARDIOVERSION;  Surgeon: Minus Breeding, MD;  Location: [facility] ENDOSCOPY;  Service: Cardiovascular;  Laterality: N/A;  [name] gave the ok to add pt to the add on , but we must check to find out if the can add pt on at 1400 ( [phone])  . CARDIOVERSION N/A 11/20/2012   Procedure: CARDIOVERSION;  Surgeon: Fay Records, MD;  Location: [place];  Service: Cardiovascular;  Laterality: N/A;  . CARDIOVERSION N/A 07/18/2017   Procedure: CARDIOVERSION;  Surgeon: Thayer Headings, MD;  Location: [facility] ENDOSCOPY;  Service: Cardiovascular;  Laterality: N/A;  . CARDIOVERSION N/A 10/03/2017   Procedure: CARDIOVERSION;  Surgeon: Sanda Klein, MD;  Location: [facility] ENDOSCOPY;  Service: Cardiovascular;  Laterality: N/A;  . CARDIOVERSION N/A 01/07/2018   Procedure: CARDIOVERSION;  Surgeon: Thayer Headings, MD;  Location: [facility] ENDOSCOPY;  Service:  Cardiovascular;  Laterality: N/A;  . CARDIOVERSION N/A 12/10/2019   Procedure: CARDIOVERSION;  Surgeon: Buford Dresser, MD;  Location: [facility] ENDOSCOPY;  Service: Cardiovascular;  Laterality: N/A;  . CHOLECYSTECTOMY    . COLONOSCOPY W/ POLYPECTOMY     Dr Earlean Shawl  . CYSTOSCOPY N/A 02/06/2015   Procedure: CYSTOSCOPY;  Surgeon: Kathie Rhodes, MD;  Location: [facility] ORS;  Service: Urology;  Laterality: N/A;  . CYSTOSCOPY W/ RETROGRADES Left 11/17/2017   Procedure: CYSTOSCOPY WITH RETROGRADE /PYELOGRAM/;  Surgeon: Kathie Rhodes, MD;  Location: [facility] ORS;  Service: Urology;  Laterality: Left;  . CYSTOSCOPY WITH RETROGRADE PYELOGRAM, URETEROSCOPY AND STENT PLACEMENT Right 02/06/2015   Procedure: RETROGRADE PYELOGRAM, RIGHT URETEROSCOPY STENT PLACEMENT;  Surgeon: Kathie Rhodes, MD;  Location: [facility] ORS;  Service: Urology;  Laterality: Right;  . CYSTOSCOPY WITH RETROGRADE PYELOGRAM, URETEROSCOPY AND STENT PLACEMENT Right 03/07/2017   Procedure: CYSTOSCOPY WITH RIGHT RETROGRADE PYELOGRAM,RIGHT  URETEROSCOPYLASER LITHOTRIPSY  AND STENT PLACEMENT AND STONE BASKETRY;  Surgeon: Kathie Rhodes, MD;  Location: [place];  Service: Urology;  Laterality: Right;  . EYE SURGERY     cataract surgery  . fatty mass removal  1999   pubic area  . HOLMIUM LASER APPLICATION N/A 3/41/9379   Procedure: HOLMIUM LASER APPLICATION;  Surgeon: Kathie Rhodes, MD;  [facility]: [facility];  Service: Urology;  Laterality: N/A;  . HOLMIUM LASER APPLICATION Right 0/24/0973   Procedure: HOLMIUM LASER APPLICATION;  Surgeon: Kathie Rhodes, MD;  Location: [facility];  Service: Urology;  Laterality: Right;  . HOLMIUM LASER APPLICATION Left 5/32/9924   Procedure: HOLMIUM LASER APPLICATION;  Surgeon: Kathie Rhodes, MD;  [facility]: [facility] ORS;  Service: Urology;  Laterality: Left;  . IR FLUORO GUIDE PORT INSERTION RIGHT  02/24/2017  . IR NEPHROSTOMY PLACEMENT RIGHT  11/17/2017  . IR PATIENT EVAL TECH 0-60 MINS  03/11/2017  . IR REMOVAL  TUN ACCESS W/ PORT W/O FL MOD SED  04/20/2018  . IR US GUIDE VASC ACCESS RIGHT  02/24/2017  . KNEE ARTHROSCOPY     bilateral  . LAPAROSCOPIC GASTRIC BANDING  07/10/2010  . LAPAROSCOPIC GASTRIC BANDING     Laparoscopic adjustable banding APS System with posterior hiatal hernia, 2 suture.  Marland Kitchen LAPAROTOMY     for ruptured ovary and ovarian artery   . NEPHROLITHOTOMY Right 11/17/2017   Procedure: NEPHROLITHOTOMY PERCUTANEOUS;  Surgeon: Kathie Rhodes, MD;  [facility];  Service: Urology;  Laterality: Right;  . PACEMAKER INSERTION  03/10/2013   MDT dual chamber PPM  . POCKET REVISION N/A 12/08/2013   Procedure: POCKET REVISION;  Surgeon: Deboraha Sprang, MD;  [facility] C[facility]  LAB;  Service: Cardiovascular;  Laterality: N/A;  . PORTA CATH INSERTION    . REVERSE SHOULDER ARTHROPLASTY Right 05/14/2018   Procedure: RIGHT REVERSE SHOULDER ARTHROPLASTY;  Surgeon: Tania Ade, MD;  Location: [place];  Service: Orthopedics;  Laterality: Right;  . REVERSE SHOULDER REPLACEMENT Right 05/14/2018  . RIGHT HEART CATH N/A 07/21/2019   Procedure: RIGHT HEART CATH;  Surgeon: Larey Dresser, MD;  Location: [place] CV LAB;  Service: Cardiovascular;  Laterality: N/A;  . TEE WITH CARDIOVERSION  09/22/2017  . TEE WITHOUT CARDIOVERSION N/A 10/03/2017   Procedure: TRANSESOPHAGEAL ECHOCARDIOGRAM (TEE);  Surgeon: Sanda Klein, MD;  Location: [facility] ENDOSCOPY;  Service: Cardiovascular;  Laterality: N/A;  . TEE WITHOUT CARDIOVERSION N/A 08/04/2019   Procedure: TRANSESOPHAGEAL ECHOCARDIOGRAM (TEE);  Surgeon: Larey Dresser, MD;  Location: [facility] ENDOSCOPY;  Service: Cardiovascular;  Laterality: N/A;  . TEE WITHOUT CARDIOVERSION N/A 12/10/2019   Procedure: TRANSESOPHAGEAL ECHOCARDIOGRAM (TEE);  Surgeon: Buford Dresser, MD;  Location: [facility] ENDOSCOPY;  Service: Cardiovascular;  Laterality: N/A;  . THYROIDECTOMY  1998   Dr Margot Chimes  . TONSILLECTOMY    . TOTAL KNEE ARTHROPLASTY  04/13/2012   Procedure: TOTAL KNEE  ARTHROPLASTY;  Surgeon: Rudean Haskell, MD;  Location: [place];  Service: Orthopedics;  Laterality: Right;  Marland Kitchen VIDEO BRONCHOSCOPY WITH ENDOBRONCHIAL ULTRASOUND N/A 02/07/2017   Procedure: VIDEO BRONCHOSCOPY WITH ENDOBRONCHIAL ULTRASOUND;  Surgeon: Marshell Garfinkel, MD;  Location: [facility];  Service: Pulmonary;  Laterality: N/A;    There were no vitals filed for this visit.  Subjective Assessment - 01/31/20 1331    Subjective  Pt states she feels more off balance in last few days. She did have trip over threshold at home, fell into her walker and doorjam, did not actually fall to floor, but has skin tear at R wrist.    Currently in Pain?  No/denies    Pain Score  0-No pain         [facility] PT Assessment - 01/31/20 0001      Special Tests   Other special tests  Head turns up/down: no symptoms,  L/R : mild dizziness, subsides in less than 1 min;  Gaze: mild symptoms, resolve in less than 1 min;  VOR: mild dizziness, resolves in less than 1 min;                     [facility] Adult PT Treatment/Exercise - 01/31/20 0001      Ambulation/Gait   Gait Comments  35 ft x6 wtih RW.       Knee/Hip Exercises: Seated   Long Arc Quad  20 reps;Both               PT Short Term Goals - 01/13/20 1330      PT SHORT TERM GOAL #1   Title  Pt to be independent with initial HEP for LE strength    Time  2    Period  Weeks    Status  Achieved    Target Date  12/22/19      PT SHORT TERM GOAL #2   Title  Pt with be compliant with safe use of 4 WW at all times, for improved safety    Time  2    Period  Weeks    Status  Achieved    Target Date  12/22/19        PT Long Term Goals - 01/13/20 1331      PT LONG TERM GOAL #1   Title  Pt to be independent with final HEP, for strength and balance    Time  6    Period  Weeks    Status  On-going    Target Date  02/10/20      PT LONG TERM GOAL #2   Title  Pt to demo improved score on BERG by at least 5 points, to improve balance and safety     Time  6    Period  Weeks    Status  Partially Met    Target Date  02/10/20      PT LONG TERM GOAL #3   Title  Pt to demo improved LE strength to at least 4+/5 to improve stability and endurance.    Time  6    Period  Weeks    Status  Partially Met    Target Date  02/10/20      PT LONG TERM GOAL #4   Title  Pt to demo ability for ambulation with LRAD, for at least 500 ft, with direction changes and dynamic stability to be [facility] for pt age.    Time  6    Period  Weeks    Status  Partially Met    Target Date  02/10/20            Plan - 01/31/20 1334    Clinical Impression Statement  Pt with increased tiredness after ambulation today. States no other symptoms today, but states that she has been slightly dizzy at home with certain movements, turning, head turns, etc.. States she has had vertigo in the past, and this is "not as bad as it was then". Pts HR stable at 69-72 during session today, (tested multiple times) . Unable to get accurate blood pressure reading today. Head turn and VOR testing: Pt with slight dizizness with VOR testing, resolved with rest less than 1 min. Will send pt to different rehab site for further vertigo testing this week. Pt asymptomatic at end of session, but no further ther ex done today, due to complaints of pt feeling tired and having increased dizziness at home.Discussed calling cardiologist, as pt reported feeling more tired last week as well after getting pace maker adjusted. Pt will benefit from future sessions for vestibular testing/treatment, as well as balance, and finalizing HEP.    Personal Factors and Comorbidities  Comorbidity 1;Comorbidity 2    Comorbidities  A-Fib, dizziness, frequent falls, OA, Neuropathy    Examination-Activity Limitations  Bathing;Dressing;Hygiene/Grooming;Bend;Lift;Squat;Stairs;Locomotion Level    Examination-Participation Restrictions  Laundry;Cleaning;Shop;Community Activity;Meal Prep    Rehab Potential  Good    PT  Frequency  2x / week    PT Duration  6 weeks    PT Treatment/Interventions  ADLs/Self Care Home Management;DME Instruction;Gait training;Stair training;Functional mobility training;Therapeutic activities;Therapeutic exercise;Balance training;Neuromuscular re-education;Patient/family education;Orthotic Fit/Training;Scar mobilization;Passive range of motion;Dry needling;Vestibular;Manual techniques;Canalith Repostioning;Cryotherapy;Electrical Stimulation;Iontophoresis 76m/ml Dexamethasone;Moist Heat    Consulted and Agree with Plan of Care  Patient       Patient will benefit from skilled therapeutic intervention in order to improve the following deficits and impairments:  Abnormal gait, Decreased balance, Decreased mobility, Difficulty walking, Dizziness, Decreased knowledge of precautions, Cardiopulmonary status limiting activity, Decreased activity tolerance, Decreased coordination, Decreased safety awareness, Decreased strength, Pain  Visit Diagnosis: Other abnormalities of gait and mobility     Problem List Patient Active Problem List   Diagnosis Date Noted  . Pacemaker 01/23/2020  . Junctional rhythm 01/23/2020  . Palpitations 01/23/2020  . ICH (intracerebral hemorrhage) ([place] 12/02/2019  . A-fib (  [place]) 12/02/2019  . Anemia 12/02/2019  . QT prolongation 12/02/2019  . Hypothyroidism 12/02/2019  . Gait abnormality 07/10/2018  . S/P reverse total shoulder arthroplasty, 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  . Persistent atrial fibrillation ([place])   . Bilateral ureteral calculi 11/17/2017  . Atrial fibrillation with RVR ([place]) 09/15/2017  . Atrial flutter ([place]) 07/17/2017  . Port catheter in place 04/02/2017  . Non-Hodgkin lymphoma, unspecified, intrathoracic lymph nodes ([place]) 02/13/2017  . Mediastinal mass 02/06/2017  . 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 Abnormal chest x-ray   . Lung mass 02/03/2017  . 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  . OSA (obstructive sleep apnea) 02/03/2015   . History of renal calculi 11/16/2014  . Renal calculi 11/16/2014  . Family history of colon cancer 10/26/2014  . Mechanical complication due to cardiac pacemaker pulse generator 11/06/2013  . Dyspnea 05/12/2013  . Hypothyroidism 04/21/2013  . Pleural effusion 04/19/2013  . Long term (current) use of anticoagulants 12/20/2010  . EPIDERMOID CYST 08/22/2010  . Chronic diastolic heart failure ([place]) 08/06/2010  . SYNCOPE AND COLLAPSE 07/27/2010  . OSTEOARTHRITIS, KNEE, RIGHT 03/15/2010  . Morbid obesity ([place]) 12/07/2009  . NONSPEC ELEVATION OF LEVELS OF TRANSAMINASE/LDH 09/08/2009  . BREAST CANCER, HX OF 07/25/2009  . COLONIC POLYPS, HX OF 07/25/2009  . TUBULOVILLOUS ADENOMA, COLON 04/29/2008  . HYPERGLYCEMIA, FASTING 04/29/2008  . HYPERLIPIDEMIA 06/22/2007  . CARCINOMA, THYROID GLAND, HX OF 06/22/2007   Lyndee Hensen, PT, DPT 1:42 PM  01/31/20    [place], [place], [address] Phone: [phone]   Fax:  [phone]  Name: Kayla Harrison MRN: [id number] Date of Birth: Aug 05, 1946

## 2020-02-01 ENCOUNTER — Ambulatory Visit (INDEPENDENT_AMBULATORY_CARE_PROVIDER_SITE_OTHER): Payer: Medicare Other

## 2020-02-01 ENCOUNTER — Ambulatory Visit (INDEPENDENT_AMBULATORY_CARE_PROVIDER_SITE_OTHER): Payer: Medicare Other | Admitting: Podiatry

## 2020-02-01 VITALS — Temp 96.2°F

## 2020-02-01 DIAGNOSIS — Q828 Other specified congenital malformations of skin: Secondary | ICD-10-CM | POA: Diagnosis not present

## 2020-02-01 DIAGNOSIS — G629 Polyneuropathy, unspecified: Secondary | ICD-10-CM

## 2020-02-01 DIAGNOSIS — M2041 Other hammer toe(s) (acquired), right foot: Secondary | ICD-10-CM

## 2020-02-01 DIAGNOSIS — M79671 Pain in right foot: Secondary | ICD-10-CM

## 2020-02-02 ENCOUNTER — Other Ambulatory Visit: Payer: Self-pay

## 2020-02-02 ENCOUNTER — Encounter: Payer: Self-pay | Admitting: Physical Therapy

## 2020-02-02 ENCOUNTER — Ambulatory Visit (INDEPENDENT_AMBULATORY_CARE_PROVIDER_SITE_OTHER): Payer: Medicare Other | Admitting: Physical Therapy

## 2020-02-02 DIAGNOSIS — R2689 Other abnormalities of gait and mobility: Secondary | ICD-10-CM | POA: Diagnosis not present

## 2020-02-02 NOTE — Progress Notes (Signed)
Subjective:   Patient ID: Kayla Harrison, female   DOB: 74 y.o.   MRN: [id number]   HPI 74 year old female presents the office today for concerns of hammertoes to her right foot on the second through fifth toes.  She gets pain at the tip of the toes when she walks.  She recently just changed to wearing a strain comfort shoe which has been helpful.  She also states that she has a wart on the bottom of her left foot on the last 2 months.  Denies any redness or drainage or any swelling.  The previously has seen Dr. Gershon Mussel is been diagnosed with fractures but she is currently not experiencing the symptoms.  She has neuropathy from chemotherapy.   Review of Systems  All other systems reviewed and are negative.  Past Medical History:  Diagnosis Date  . Anemia   . Arthritis    osteoarthritis - knees and right shoulder  . Atrial fibrillation [facility])    ablation- 2x's-- 1st time- [facility], 2nd event at [facility] in 2008. Convergent ablation at [facility] 6/14  . Atrial fibrillation ([place])   . Blood transfusion without reported diagnosis   . Breast cancer ([place])    Dr Margot Chimes, total thyroidectomy- 1999- for cancer  . 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 Chronic bilateral pleural effusions   . Colon polyp    Dr Earlean Shawl  . Complete heart block ([place])   . Complication of anesthesia    Ketamine produces LSD reaction, bright colored nightmarish experience   . Dyslipidemia   . Dyspnea   . Endometriosis   . Fibroids   . H/O pleural effusion    s/p thoracentesis w 3264m withdrawn  . Hepatitis    [place]hepatitis   . History of dysphagia    due to radiation therapy  . History of hiatal hernia    small noted on PET scan  . History of kidney stones   . Hx of thyroid cancer    Dr SForde Dandy . Hyperlipidemia   . Hypertension   . Hypothyroidism   . Lung cancer, lower lobe ([place] 01[place]   radiation RX completed 03/04/17; will start chemo 6/27, pt unaware of lung cancer  . Morbid obesity  ([place]    Status post lap band surgery  . Nephrolithiasis   . [place] Hod[place]kin's [place]    on chemotherapy  . Persistent atrial fibrillation ([place]    a. s/p PVI 2008 b. s/p convergent ablation 25956complicated by bradycardia requiring pacemaker implant  . Personal history of radiation therapy   . Presence of permanent cardiac pacemaker   . Rotator cuff tear    Right  . Sinus node dysfunction ([place]    Complicating convergent ablation 6/14  . St[place]    [place]  . SVC syndrome    with lung mass and non hodgkins lymphoma  . Thyroid cancer ([place] 2000    Past Surgical History:  Procedure Laterality Date  . ABDOMINAL HYSTERECTOMY  1983  . afib ablation     a. 2008 PVI b. 2014 convergent ablation  . APPENDECTOMY    . BONE MARROW BIOPSY  02/21/2017  . BREAST LUMPECTOMY Left 2010  . bso  1998  . CARDIAC CATHETERIZATION     2015- negative  . CARDIOVERSION  10/09/2012   Procedure: CARDIOVERSION;  Surgeon: JMinus Breeding MD;  [place]  Service: Cardiovascular;  Laterality: N/A;  . CARDIOVERSION  10/09/2012   Procedure:  CARDIOVERSION;  Surgeon: Minus Breeding, MD;  Location: [facility] ENDOSCOPY;  Service: Cardiovascular;  Laterality: N/A;  [name] gave the ok to add pt to the add on , but we must check to find out if the can add pt on at [phone] ( [phone])  . CARDIOVERSION N/A 11/20/2012   Procedure: CARDIOVERSION;  Surgeon: Fay Records, MD;  Location: [place];  Service: Cardiovascular;  Laterality: N/A;  . CARDIOVERSION N/A 07/18/2017   Procedure: CARDIOVERSION;  Surgeon: Thayer Headings, MD;  Location: [facility] ENDOSCOPY;  Service: Cardiovascular;  Laterality: N/A;  . CARDIOVERSION N/A 10/03/2017   Procedure: CARDIOVERSION;  Surgeon: Sanda Klein, MD;  Location: [facility] ENDOSCOPY;  Service: Cardiovascular;  Laterality: N/A;  . CARDIOVERSION N/A 01/07/2018   Procedure: CARDIOVERSION;  Surgeon: Thayer Headings, MD;  Location: [facility] ENDOSCOPY;  Service: Cardiovascular;  Laterality:  N/A;  . CARDIOVERSION N/A 12/10/2019   Procedure: CARDIOVERSION;  Surgeon: Buford Dresser, MD;  Location: [facility] ENDOSCOPY;  Service: Cardiovascular;  Laterality: N/A;  . CHOLECYSTECTOMY    . COLONOSCOPY W/ POLYPECTOMY     Dr Earlean Shawl  . CYSTOSCOPY N/A 02/06/2015   Procedure: CYSTOSCOPY;  Surgeon: Kathie Rhodes, MD;  Location: [facility] ORS;  Service: Urology;  Laterality: N/A;  . CYSTOSCOPY W/ RETROGRADES Left 11/17/2017   Procedure: CYSTOSCOPY WITH RETROGRADE /PYELOGRAM/;  Surgeon: Kathie Rhodes, MD;  Location: [facility];  Service: Urology;  Laterality: Left;  . CYSTOSCOPY WITH RETROGRADE PYELOGRAM, URETEROSCOPY AND STENT PLACEMENT Right 02/06/2015   Procedure: RETROGRADE PYELOGRAM, RIGHT URETEROSCOPY STENT PLACEMENT;  Surgeon: Kathie Rhodes, MD;  Location: [facility];  Service: Urology;  Laterality: Right;  . CYSTOSCOPY WITH RETROGRADE PYELOGRAM, URETEROSCOPY AND STENT PLACEMENT Right 03/07/2017   Procedure: CYSTOSCOPY WITH RIGHT RETROGRADE PYELOGRAM,RIGHT  URETEROSCOPYLASER LITHOTRIPSY  AND STENT PLACEMENT AND STONE BASKETRY;  Surgeon: Kathie Rhodes, MD;  Location: [place];  Service: Urology;  Laterality: Right;  . EYE SURGERY     cataract surgery  . fatty mass removal  1999   pubic area  . HOLMIUM LASER APPLICATION N/A 5/95/6387   Procedure: HOLMIUM LASER APPLICATION;  Surgeon: Kathie Rhodes, MD;  [facility]: [facility];  Service: Urology;  Laterality: N/A;  . HOLMIUM LASER APPLICATION Right 5/64/3329   Procedure: HOLMIUM LASER APPLICATION;  Surgeon: Kathie Rhodes, MD;  Location: [facility];  Service: Urology;  Laterality: Right;  . HOLMIUM LASER APPLICATION Left 02/08/8415   Procedure: HOLMIUM LASER APPLICATION;  Surgeon: Kathie Rhodes, MD;  Location: [facility];  Service: Urology;  Laterality: Left;  . IR FLUORO GUIDE PORT INSERTION RIGHT  02/24/2017  . IR NEPHROSTOMY PLACEMENT RIGHT  11/17/2017  . IR PATIENT EVAL TECH 0-60 MINS  03/11/2017  . IR REMOVAL TUN ACCESS W/ PORT W/O FL  MOD SED  04/20/2018  . IR US GUIDE VASC ACCESS RIGHT  02/24/2017  . KNEE ARTHROSCOPY     bilateral  . LAPAROSCOPIC GASTRIC BANDING  07/10/2010  . LAPAROSCOPIC GASTRIC BANDING     Laparoscopic adjustable banding APS System with posterior hiatal hernia, 2 suture.  Marland Kitchen LAPAROTOMY     for ruptured ovary and ovarian artery   . NEPHROLITHOTOMY Right 11/17/2017   Procedure: NEPHROLITHOTOMY PERCUTANEOUS;  Surgeon: Kathie Rhodes, MD;  [facility];  Service: Urology;  Laterality: Right;  . PACEMAKER INSERTION  03/10/2013   MDT dual chamber PPM  . POCKET REVISION N/A 12/08/2013   Procedure: POCKET REVISION;  Surgeon: Deboraha Sprang, MD;  Location: [facility] CATH LAB;  Service: Cardiovascular;  Laterality: N/A;  . PORTA  CATH INSERTION    . REVERSE SHOULDER ARTHROPLASTY Right 05/14/2018   Procedure: RIGHT REVERSE SHOULDER ARTHROPLASTY;  Surgeon: Tania Ade, MD;  Location: [place];  Service: Orthopedics;  Laterality: Right;  . REVERSE SHOULDER REPLACEMENT Right 05/14/2018  . RIGHT HEART CATH N/A 07/21/2019   Procedure: RIGHT HEART CATH;  Surgeon: Larey Dresser, MD;  Location: [place] CV LAB;  Service: Cardiovascular;  Laterality: N/A;  . TEE WITH CARDIOVERSION  09/22/2017  . TEE WITHOUT CARDIOVERSION N/A 10/03/2017   Procedure: TRANSESOPHAGEAL ECHOCARDIOGRAM (TEE);  Surgeon: Sanda Klein, MD;  Location: [facility] ENDOSCOPY;  Service: Cardiovascular;  Laterality: N/A;  . TEE WITHOUT CARDIOVERSION N/A 08/04/2019   Procedure: TRANSESOPHAGEAL ECHOCARDIOGRAM (TEE);  Surgeon: Larey Dresser, MD;  Location: [facility] ENDOSCOPY;  Service: Cardiovascular;  Laterality: N/A;  . TEE WITHOUT CARDIOVERSION N/A 12/10/2019   Procedure: TRANSESOPHAGEAL ECHOCARDIOGRAM (TEE);  Surgeon: Buford Dresser, MD;  Location: [facility] ENDOSCOPY;  Service: Cardiovascular;  Laterality: N/A;  . THYROIDECTOMY  1998   Dr Margot Chimes  . TONSILLECTOMY    . TOTAL KNEE ARTHROPLASTY  04/13/2012   Procedure: TOTAL KNEE ARTHROPLASTY;  Surgeon: Rudean Haskell, MD;  Location: [place];  Service: Orthopedics;  Laterality: Right;  Marland Kitchen VIDEO BRONCHOSCOPY WITH ENDOBRONCHIAL ULTRASOUND N/A 02/07/2017   Procedure: VIDEO BRONCHOSCOPY WITH ENDOBRONCHIAL ULTRASOUND;  Surgeon: Marshell Garfinkel, MD;  Location: [place];  Service: Pulmonary;  Laterality: N/A;     Current Outpatient Medications:  .  acetaminophen (TYLENOL) 500 MG tablet, Take 500-1,000 mg by mouth every 6 (six) hours as needed for mild pain or headache., Disp: , Rfl:  .  amiodarone (PACERONE) 200 MG tablet, Take '200mg'$  - 1 tablet twice daily x 2 weeks then begin '200mg'$  1 tablet daily., Disp: 30 tablet, Rfl: 0 .  apixaban (ELIQUIS) 5 MG TABS tablet, Take 5 mg by mouth 2 (two) times daily., Disp: , Rfl:  .  calcium-vitamin D (OSCAL WITH D) 500-200 MG-UNIT tablet, Take 2 tablets by mouth daily with breakfast., Disp: 180 tablet, Rfl: 1 .  furosemide (LASIX) 20 MG tablet, Take 20 mg by mouth daily as needed for fluid or edema. , Disp: , Rfl:  .  levothyroxine (SYNTHROID) 137 MCG tablet, Take 137 mcg by mouth See admin instructions. TAKE 1 TABLET (137 MCG) BY MOUTH DAILY, EXCEPT SATURDAYS, Disp: , Rfl:  .  Lifitegrast (XIIDRA) 5 % SOLN, Place 1 drop into both eyes at bedtime. , Disp: , Rfl:  .  Multiple Vitamins-Minerals (ONE-A-DAY WOMENS 50+ ADVANTAGE) TABS, Take 1 tablet by mouth daily with breakfast., Disp: , Rfl:  .  rosuvastatin (CRESTOR) 10 MG tablet, Take 10 mg by mouth 2 (two) times a week. Takes on Monday and Thursday, Disp: , Rfl:   Current Facility-Administered Medications:  .  sodium chloride flush (NS) 0.9 % injection 3 mL, 3 mL, Intravenous, Q12H, Deboraha Sprang, MD  Allergies  Allergen Reactions  . Dofetilide Other (See Comments)    Prolonged QT interval   . Ranolazine     Balance issues  . Rivaroxaban Other (See Comments)    Nose Bleed X 6 hrs ; packing in ER Nasal hemorrhage  . Rivaroxaban     Nose Bleed X 6 hrs ; packing in ER Nasal hemorrhage  . Tape Other (See Comments)     SKIN IS THIN AND TEARS EASILY   . Tikosyn [Dofetilide] Other (See Comments)    "Long QT wave"   . Epinephrine Other (See Comments)    Oral anesthetic Dental form only (  liquid). Patient stated she will become "out of it" she can hear you but cannot respond in normal fashion. "not fully with it"  . Epinephrine   . Ketamine Other (See Comments)    Hallucinations   . Adhesive [Tape] Rash    Paper tape as well  . Ketamine Other (See Comments)    HALLUCINATIONS         Objective:  Physical Exam  General: AAO x3, NAD  Dermatological: On the left foot metatarsal 2 is a pinpoint annular hyperkeratotic lesion.  Upon debridement appear to be deep porokeratosis.  There is no evidence of breakage or pinpoint bleeding.  No open lesions.  Vascular: Dorsalis Pedis artery and Posterior Tibial artery pedal pulses are 2/4 bilateral with immedate capillary fill time. There is no pain with calf compression, swelling, warmth, erythema.  Denies any claudication symptoms  Neruologic: Decreased with Semmes Weinstein monofilament  Musculoskeletal: Semirigid hammertoe contractures present with the right side worse than left.  She is tenderness the tips of the toes subjectively with walking.  Flatfoot is evident bilaterally.  Mild tenderness to the hyperkeratotic lesion left foot prior to debridement.  Gait: Unassisted, Nonantalgic.       Assessment:   Hammertoes right foot, porokeratosis     Plan:   -Treatment options discussed including all alternatives, risks, and complications -Etiology of symptoms were discussed -X-rays were obtained and reviewed with the patient of the right foot. Flatfoot is evident with hammertoe contractures present.  No evidence of acute fracture.  Vessel calcifications present. -Dispensed toe crest we discussed modifications.  We also discussed stretching, toe exercises to help strengthen the toes.  Unfortunately discussed the toes are likely not to go straight without  surgery which she does not want to consider. -Debrided the hyperkeratotic lesion left foot with any complications or bleeding.  Moisturizer daily. -Significant vessel calcification on x-ray but she has palpable pulses.  Discussed signs of poor circulation today.  Trula Slade DPM

## 2020-02-02 NOTE — Therapy (Addendum)
[facility] [address] [place], [place], [address] Phone: [phone]   Fax:  [phone]  Physical Therapy Treatment  Patient Details  Name: Kayla Harrison MRN: [id number] Date of Birth: [birth year] Referring Provider (PT): Wendee Beavers   Encounter Date: 02/02/2020  PT End of Session - 02/02/20 1418    Visit Number  17    Number of Visits  20    Date for PT Re-Evaluation  02/10/20    Authorization Type  Medicare    PT Start Time  0923    PT Stop Time  1100    PT Time Calculation (min)  45 min    Equipment Utilized During Treatment  Gait belt    Activity Tolerance  Patient tolerated treatment well    Behavior During Therapy  [facility] for tasks assessed/performed       Past Medical History:  Diagnosis Date  . Anemia   . Arthritis    osteoarthritis - knees and right shoulder  . Atrial fibrillation [facility])    ablation- 2x's-- 1st time- [facility], 2nd event at [facility] in 2008. Convergent ablation at [facility] 6/14  . Atrial fibrillation ([place])   . Blood transfusion without reported diagnosis   . Breast cancer ([place])    Dr Margot Chimes, total thyroidectomy- 1999- for cancer  . 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 Chronic bilateral pleural effusions   . Colon polyp    Dr Earlean Shawl  . Complete heart block ([place])   . Complication of anesthesia    Ketamine produces LSD reaction, bright colored nightmarish experience   . Dyslipidemia   . Dyspnea   . Endometriosis   . Fibroids   . H/O pleural effusion    s/p thoracentesis w 327m withdrawn  . Hepatitis    [place] ?hepatitis   . History of dysphagia    due to radiation therapy  . History of hiatal hernia    small noted on PET scan  . History of kidney stones   . Hx of thyroid cancer    Dr SForde Dandy . Hyperlipidemia   . Hypertension   . Hypothyroidism   . Lung cancer, lower lobe ([place] 01/2017   radiation RX completed 03/04/17; will start chemo 6/27, pt unaware of lung cancer   . Morbid obesity ([place]    Status post lap band surgery  . Nephrolithiasis   . Non Hodgkin's lymphoma ([place]    on chemotherapy  . Persistent atrial fibrillation ([place]    a. s/p PVI 2008 b. s/p convergent ablation [id number] by bradycardia requiring pacemaker implant  . Personal history of radiation therapy   . Presence of permanent cardiac pacemaker   . Rotator cuff tear    Right  . Sinus node dysfunction ([place]    Complicating convergent ablation 6/14  . Stroke ([facility]    [place]  . SVC syndrome    with lung mass and non hodgkins lymphoma  . Thyroid cancer ([place] 2000    Past Surgical History:  Procedure Laterality Date  . ABDOMINAL HYSTERECTOMY  1983  . afib ablation     a. 2008 PVI b. 2014 convergent ablation  . APPENDECTOMY    . BONE MARROW BIOPSY  02/21/2017  . BREAST LUMPECTOMY Left 2010  . bso  1998  . CARDIAC CATHETERIZATION     2015- negative  . CARDIOVERSION  10/09/2012   Procedure: CARDIOVERSION;  Surgeon: JMinus Breeding MD;  [facility] [facility]  Service: Cardiovascular;  Laterality: N/A;  . CARDIOVERSION  10/09/2012   Procedure: CARDIOVERSION;  Surgeon: Minus Breeding, MD;  Location: [facility] ENDOSCOPY;  Service: Cardiovascular;  Laterality: N/A;  [name] gave the ok to add pt to the add on , but we must check to find out if the can add pt on at [phone] ( [phone])  . CARDIOVERSION N/A 11/20/2012   Procedure: CARDIOVERSION;  Surgeon: Fay Records, MD;  Location: [place];  Service: Cardiovascular;  Laterality: N/A;  . CARDIOVERSION N/A 07/18/2017   Procedure: CARDIOVERSION;  Surgeon: Thayer Headings, MD;  Location[facility] ENDOSCOPY;  Service: Cardiovascular;  Laterality: N/A;  . CARDIOVERSION N/A 10/03/2017   Procedure: CARDIOVERSION;  Surgeon: Sanda Klein, MD;  Location: [facility] ENDOSCOPY;  Service: Cardiovascular;  Laterality: N/A;  . CARDIOVERSION N/A 01/07/2018   Procedure: CARDIOVERSION;  Surgeon: Thayer Headings, MD;  Location: [facility] ENDOSCOPY;  Service:  Cardiovascular;  Laterality: N/A;  . CARDIOVERSION N/A 12/10/2019   Procedure: CARDIOVERSION;  Surgeon: Buford Dresser, MD;  Location: [facility] ENDOSCOPY;  Service: Cardiovascular;  Laterality: N/A;  . CHOLECYSTECTOMY    . COLONOSCOPY W/ POLYPECTOMY     Dr Earlean Shawl  . CYSTOSCOPY N/A 02/06/2015   Procedure: CYSTOSCOPY;  Surgeon: Kathie Rhodes, MD;  Location: [facility] ORS;  Service: Urology;  Laterality: N/A;  . CYSTOSCOPY W/ RETROGRADES Left 11/17/2017   Procedure: CYSTOSCOPY WITH RETROGRADE /PYELOGRAM/;  Surgeon: Kathie Rhodes, MD;  Location: [facility];  Service: Urology;  Laterality: Left;  . CYSTOSCOPY WITH RETROGRADE PYELOGRAM, URETEROSCOPY AND STENT PLACEMENT Right 02/06/2015   Procedure: RETROGRADE PYELOGRAM, RIGHT URETEROSCOPY STENT PLACEMENT;  Surgeon: Kathie Rhodes, MD;  [facility]: [facility];  Service: Urology;  Laterality: Right;  . CYSTOSCOPY WITH RETROGRADE PYELOGRAM, URETEROSCOPY AND STENT PLACEMENT Right 03/07/2017   Procedure: CYSTOSCOPY WITH RIGHT RETROGRADE PYELOGRAM,RIGHT  URETEROSCOPYLASER LITHOTRIPSY  AND STENT PLACEMENT AND STONE BASKETRY;  Surgeon: Kathie Rhodes, MD;  Location: [place];  Service: Urology;  Laterality: Right;  . EYE SURGERY     cataract surgery  . fatty mass removal  1999   pubic area  . HOLMIUM LASER APPLICATION N/A 05/21/5620   Procedure: HOLMIUM LASER APPLICATION;  Surgeon: Kathie Rhodes, MD;  [facility]: [facility];  Service: Urology;  Laterality: N/A;  . HOLMIUM LASER APPLICATION Right 11/28/6576   Procedure: HOLMIUM LASER APPLICATION;  Surgeon: Kathie Rhodes, MD;  Location: [facility];  Service: Urology;  Laterality: Right;  . HOLMIUM LASER APPLICATION Left 4/69/6295   Procedure: HOLMIUM LASER APPLICATION;  Surgeon: Kathie Rhodes, MD;  Location: [facility]S;  Service: Urology;  Laterality: Left;  . IR FLUORO GUIDE PORT INSERTION RIGHT  02/24/2017  . IR NEPHROSTOMY PLACEMENT RIGHT  11/17/2017  . IR PATIENT EVAL TECH 0-60 MINS  03/11/2017  . IR REMOVAL  TUN ACCESS W/ PORT W/O FL MOD SED  04/20/2018  . IR US GUIDE VASC ACCESS RIGHT  02/24/2017  . KNEE ARTHROSCOPY     bilateral  . LAPAROSCOPIC GASTRIC BANDING  07/10/2010  . LAPAROSCOPIC GASTRIC BANDING     Laparoscopic adjustable banding APS System with posterior hiatal hernia, 2 suture.  Marland Kitchen LAPAROTOMY     for ruptured ovary and ovarian artery   . NEPHROLITHOTOMY Right 11/17/2017   Procedure: NEPHROLITHOTOMY PERCUTANEOUS;  Surgeon: Kathie Rhodes, MD;  [facility]: [facility];  Service: Urology;  Laterality: Right;  . PACEMAKER INSERTION  03/10/2013   MDT dual chamber PPM  . POCKET REVISION N/A 12/08/2013   Procedure: POCKET REVISION;  Surgeon: Deboraha Sprang, MD;  [facility] CATH  LAB;  Service: Cardiovascular;  Laterality: N/A;  . PORTA CATH INSERTION    . REVERSE SHOULDER ARTHROPLASTY Right 05/14/2018   Procedure: RIGHT REVERSE SHOULDER ARTHROPLASTY;  Surgeon: Tania Ade, MD;  Location: [place];  Service: Orthopedics;  Laterality: Right;  . REVERSE SHOULDER REPLACEMENT Right 05/14/2018  . RIGHT HEART CATH N/A 07/21/2019   Procedure: RIGHT HEART CATH;  Surgeon: Larey Dresser, MD;  Location: [place] CV LAB;  Service: Cardiovascular;  Laterality: N/A;  . TEE WITH CARDIOVERSION  09/22/2017  . TEE WITHOUT CARDIOVERSION N/A 10/03/2017   Procedure: TRANSESOPHAGEAL ECHOCARDIOGRAM (TEE);  Surgeon: Sanda Klein, MD;  Location: [facility] ENDOSCOPY;  Service: Cardiovascular;  Laterality: N/A;  . TEE WITHOUT CARDIOVERSION N/A 08/04/2019   Procedure: TRANSESOPHAGEAL ECHOCARDIOGRAM (TEE);  Surgeon: Larey Dresser, MD;  Location: [facility] ENDOSCOPY;  Service: Cardiovascular;  Laterality: N/A;  . TEE WITHOUT CARDIOVERSION N/A 12/10/2019   Procedure: TRANSESOPHAGEAL ECHOCARDIOGRAM (TEE);  Surgeon: Buford Dresser, MD;  Location: [facility] ENDOSCOPY;  Service: Cardiovascular;  Laterality: N/A;  . THYROIDECTOMY  1998   Dr Margot Chimes  . TONSILLECTOMY    . TOTAL KNEE ARTHROPLASTY  04/13/2012   Procedure: TOTAL KNEE  ARTHROPLASTY;  Surgeon: Rudean Haskell, MD;  Location: [place];  Service: Orthopedics;  Laterality: Right;  Marland Kitchen VIDEO BRONCHOSCOPY WITH ENDOBRONCHIAL ULTRASOUND N/A 02/07/2017   Procedure: VIDEO BRONCHOSCOPY WITH ENDOBRONCHIAL ULTRASOUND;  Surgeon: Marshell Garfinkel, MD;  Location: [place];  Service: Pulmonary;  Laterality: N/A;    There were no vitals filed for this visit.  Subjective Assessment - 02/02/20 1416    Subjective  Pt states she has stopped taking one of her cardiac meds (as directed by cardiologist), and feels much better today. She feels no dizziness, and feels much more stable with balance and walking today.    Currently in Pain?  No/denies    Pain Score  0-No pain         [facility] PT Assessment - 02/02/20 0001      Berg Balance Test   Sit to Stand  Able to stand without using hands and stabilize independently    Standing Unsupported  Able to stand safely 2 minutes    Sitting with Back Unsupported but Feet Supported on Floor or Stool  Able to sit safely and securely 2 minutes    Stand to Sit  Sits safely with minimal use of hands    Transfers  Able to transfer safely, minor use of hands    Standing Unsupported with Eyes Closed  Able to stand 10 seconds safely    Standing Unsupported with Feet Together  Able to place feet together independently and stand 1 minute safely    From Standing, Reach Forward with Outstretched Arm  Can reach confidently >25 cm (10")    From Standing Position, Pick up Object from Floor  Able to pick up shoe safely and easily    From Standing Position, Turn to Look Behind Over each Shoulder  Looks behind from both sides and weight shifts well    Turn 360 Degrees  Able to turn 360 degrees safely one side only in 4 seconds or less    Standing Unsupported, Alternately Place Feet on Step/Stool  Able to complete 4 steps without aid or supervision    Standing Unsupported, One Foot in Front  Able to take small step independently and hold 30 seconds    Standing on  One Leg  Tries to lift leg/unable to hold 3 seconds but remains standing independently  Total Score  48      Timed Up and Go Test   TUG Comments  RW:  12:21,   SPC: 11.44                     [facility] Adult PT Treatment/Exercise - 02/02/20 0001      Ambulation/Gait   Gait Comments  35 ft x6 wtih RW.       Knee/Hip Exercises: Standing   Hip Flexion  20 reps;Knee bent    Hip Abduction  2 sets;10 reps    Other Standing Knee Exercises  Corner balance: Staggered stance weight shifts x20 bil; L/R and A/P weight shifts ; DLS with head turns L/R x10;  Tandem stance 30 sec x2 bil;       Knee/Hip Exercises: Seated   Long Arc Quad  20 reps;Both    Marching  20 reps    Sit to [employer]  5 reps      Knee/Hip Exercises: Supine   Bridges  20 reps    Straight Leg Raises  10 reps;Both    Other Supine Knee/Hip Exercises  Supine march x25;       Knee/Hip Exercises: Sidelying   Hip ABduction  10 reps;Both             PT Education - 02/02/20 1417    Education Details  Final HEP reviewed, with safety implications for balance exercises at home.    Person(s) Educated  Patient    Methods  Explanation;Demonstration;Tactile cues;Verbal cues;Handout    Comprehension  Verbalized understanding;Returned demonstration;Verbal cues required;Tactile cues required       PT Short Term Goals - 01/13/20 1330      PT SHORT TERM GOAL #1   Title  Pt to be independent with initial HEP for LE strength    Time  2    Period  Weeks    Status  Achieved    Target Date  12/22/19      PT SHORT TERM GOAL #2   Title  Pt with be compliant with safe use of 4 WW at all times, for improved safety    Time  2    Period  Weeks    Status  Achieved    Target Date  12/22/19        PT Long Term Goals - 02/02/20 1418      PT LONG TERM GOAL #1   Title  Pt to be independent with final HEP, for strength and balance    Time  6    Period  Weeks    Status  Achieved      PT LONG TERM GOAL #2   Title  Pt to  demo improved score on BERG by at least 5 points, to improve balance and safety    Time  6    Period  Weeks    Status  Achieved      PT LONG TERM GOAL #3   Title  Pt to demo improved LE strength to at least 4+/5 to improve stability and endurance.    Time  6    Period  Weeks    Status  Partially Met      PT LONG TERM GOAL #4   Title  Pt to demo ability for ambulation with LRAD, for at least 500 ft, with direction changes and dynamic stability to be [facility] for pt age.    Time  6    Period  Weeks  Status  Achieved            Plan - 02/02/20 1419    Clinical Impression Statement  Pt feeling much better today after medication change . Denies any dizziness, and has no sympotms during head turns, bending, or with activiteis today. Vestibular assesment canceled, pt will discuss with MD in future if needed. Pt has made good progress and is ready for d/c to HEP. Pt with much improved scores on balance tests, and improved safety with ambulation on level and outdoor surfaces. Pt with improved LE strength, but continues to have weakness in HIps, and will continue HEP for strengthening. Pt has met most goals at this time. Final Hep reviewed in detail today, discussed safe ways to perform balance exercises at home, in corner or in front of bed, with walker in front of her. Pt states understanding.    Personal Factors and Comorbidities  Comorbidity 1;Comorbidity 2    Comorbidities  A-Fib, dizziness, frequent falls, OA, Neuropathy    Examination-Activity Limitations  Bathing;Dressing;Hygiene/Grooming;Bend;Lift;Squat;Stairs;Locomotion Level    Examination-Participation Restrictions  Laundry;Cleaning;Shop;Community Activity;Meal Prep    Rehab Potential  Good    PT Frequency  2x / week    PT Duration  6 weeks    PT Treatment/Interventions  ADLs/Self Care Home Management;DME Instruction;Gait training;Stair training;Functional mobility training;Therapeutic activities;Therapeutic exercise;Balance  training;Neuromuscular re-education;Patient/family education;Orthotic Fit/Training;Scar mobilization;Passive range of motion;Dry needling;Vestibular;Manual techniques;Canalith Repostioning;Cryotherapy;Electrical Stimulation;Iontophoresis 8m/ml Dexamethasone;Moist Heat    Consulted and Agree with Plan of Care  Patient       Patient will benefit from skilled therapeutic intervention in order to improve the following deficits and impairments:  Abnormal gait, Decreased balance, Decreased mobility, Difficulty walking, Dizziness, Decreased knowledge of precautions, Cardiopulmonary status limiting activity, Decreased activity tolerance, Decreased coordination, Decreased safety awareness, Decreased strength, Pain  Visit Diagnosis: Other abnormalities of gait and mobility     Problem List Patient Active Problem List   Diagnosis Date Noted  . Pacemaker 01/23/2020  . Junctional rhythm 01/23/2020  . Palpitations 01/23/2020  . ICH (intracerebral hemorrhage) ([place] 12/02/2019  . A-fib ([place] 12/02/2019  . Anemia 12/02/2019  . QT prolongation 12/02/2019  . Hypothyroidism 12/02/2019  . Gait abnormality 07/10/2018  . S/P reverse total shoulder arthroplasty, 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  . Persistent atrial fibrillation ([place]   . Bilateral ureteral calculi 11/17/2017  . Atrial fibrillation with RVR ([place] 09/15/2017  . Atrial flutter ([place] 07/17/2017  . Port catheter in place 04/02/2017  . Non-Hodgkin lymphoma, unspecified, intrathoracic lymph nodes ([place] 02/13/2017  . Mediastinal mass 02/06/2017  . 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 Abnormal chest x-ray   . Lung mass 02/03/2017  . 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  . OSA (obstructive sleep apnea) 02/03/2015  . History of renal calculi 11/16/2014  . Renal calculi 11/16/2014  . Family history of colon cancer 10/26/2014  . Mechanical complication due to cardiac pacemaker pulse generator 11/06/2013  . Dyspnea 05/12/2013  . Hypothyroidism  04/21/2013  . Pleural effusion 04/19/2013  . Long term (current) use of anticoagulants 12/20/2010  . EPIDERMOID CYST 08/22/2010  . Chronic diastolic heart failure ([place] 08/06/2010  . SYNCOPE AND COLLAPSE 07/27/2010  . OSTEOARTHRITIS, KNEE, RIGHT 03/15/2010  . Morbid obesity ([place] 12/07/2009  . NONSPEC ELEVATION OF LEVELS OF TRANSAMINASE/LDH 09/08/2009  . BREAST CANCER, HX OF 07/25/2009  . COLONIC POLYPS, HX OF 07/25/2009  . TUBULOVILLOUS ADENOMA, COLON 04/29/2008  . HYPERGLYCEMIA, FASTING 04/29/2008  . HYPERLIPIDEMIA 06/22/2007  . CARCINOMA, THYROID GLAND, HX OF 06/22/2007    LAnder Purpura  Kayleen Memos, PT, DPT 2:23 PM  02/02/20    [place], [place], [address] Phone: [phone]   Fax:  [phone]  Name: JOZELYNN DANIELSON MRN: [id number] Date of Birth: [birth year]      PHYSICAL THERAPY DISCHARGE SUMMARY  Visits from Start of Care:17 Plan: Patient agrees to discharge.  Patient goals were met. Patient is being discharged due to meeting the stated rehab goals.  ?????    Lyndee Hensen, PT, DPT 2:23 PM  02/02/20

## 2020-02-02 NOTE — Patient Instructions (Signed)
Access Code: [email or web address] URL: [email or web address] Date: 02/02/2020 Prepared by: Lyndee Hensen  Exercises Supine Bridge - 1 x daily - 2 sets - 10 reps Supine March - 1 x daily - 2 sets - 10 reps Straight Leg Raise - 1 x daily - 1-2 sets - 5-10 reps Sidelying Hip Abduction - 1 x daily - 2 sets - 10 reps Standing March with Counter Support - 1 x daily - 2 sets - 10 reps Standing Hip Abduction with Counter Support - 1 x daily - 2 sets - 10 reps Standing Anterior Posterior Weight Shift with Chair - 1 x daily - 2 sets - 10 reps Standing Weight Shift Side to Side - 1 x daily - 2 sets - 10 reps Standing Romberg to 3/4 Tandem Stance - 1 x daily - 3 reps - 30 hold Seated March - 1 x daily - 2 sets - 10 reps

## 2020-02-03 ENCOUNTER — Other Ambulatory Visit: Payer: Self-pay | Admitting: Podiatry

## 2020-02-03 ENCOUNTER — Encounter: Payer: Medicare Other | Admitting: Rehabilitative and Restorative Service Providers"

## 2020-02-03 DIAGNOSIS — M2041 Other hammer toe(s) (acquired), right foot: Secondary | ICD-10-CM

## 2020-02-09 ENCOUNTER — Telehealth: Payer: Self-pay

## 2020-02-09 NOTE — Telephone Encounter (Signed)
   [facility] Pre-operative Risk Assessment    HEARTCARE STAFF: - Please ensure there is not already an duplicate clearance open for this procedure - Under Visit Info/Reason for Call, type in Other and utilize the format Clearance MM/DD/YY or Clearance TBD  Request for surgical clearance:   1. What type of surgery is being performed? One tooth extraction with bone grafting   2. When is this surgery scheduled? TBD   3. What type of clearance is required (medical clearance vs. Pharmacy clearance to hold med vs. Both)? both  4. Are there any medications that need to be held prior to surgery and how long? Eliquis   5. Practice name and name of physician performing surgery? Dr. Loyal Gambler   6. What is the office phone number? [phone]   7.   What is the office fax number? [phone]  8.   Anesthesia type (None, local, MAC, general) ? local   Kayla Harrison 02/09/2020, 4:38 PM  _________________________________________________________________   (provider comments below)

## 2020-02-10 NOTE — Telephone Encounter (Signed)
Patient with diagnosis of afib on Eliquis for anticoagulation.    Procedure:  One tooth extraction with bone grafting  Date of procedure: TBD  CHADS2-VASc score of  7 (CHF, HTN, AGE,  stroke/tia x 2, AGE, female)  CrCl 54 ml/min  Per office protocol, patient can hold Eliquis for 1 day prior to procedure.

## 2020-02-10 NOTE — Telephone Encounter (Signed)
   Primary Cardiologist: [name], MD  Chart reviewed as part of pre-operative protocol coverage.Tonita Bills Kanner was last seen on 01/24/2020 by Dr. Caryl Comes at which time she reported some symptom improvement.  PPM parameters were adjusted for optimal symptom reduction.     Simple dental extractions are considered low risk procedures per guidelines and generally do not require any specific cardiac clearance. It is also generally accepted that for simple extractions and dental cleanings, there is no need to interrupt blood thinner therapy however given that she will also have bone grafting, anticoagulation plan sent to pharmacy for review.  Patient with diagnosis of afib on Eliquis for anticoagulation.    Procedure: One tooth extraction with bone grafting Date of procedure: TBD  CHADS2-VASc score of  7 (CHF, HTN, AGE,  stroke/tia x 2, AGE, female)  CrCl 54 ml/min  Per office protocol, patient can hold Eliquis for 1 day prior to procedure  I will route this recommendation to the requesting party via [facility] fax function and remove from pre-op pool.  Please call with questions.  Kathyrn Drown, NP 02/10/2020, 10:56 AM        Patient with diagnosis of afib on Eliquis for anticoagulation.    Procedure: One tooth extraction with bone grafting Date of procedure: TBD  CHADS2-VASc score of  7 (CHF, HTN, AGE,  stroke/tia x 2, AGE, female)  CrCl 54 ml/min  Therefore, based on ACC/AHA guidelines, the patient would be at acceptable risk for the planned procedure without further cardiovascular testing.   I will route this recommendation to the requesting party via Epic fax function and remove from pre-op pool.  Please call with questions.  Kathyrn Drown, NP 02/10/2020, 10:54 AM

## 2020-02-29 IMAGING — MR MR CERVICAL SPINE W/O CM
5 series · 35 of 48 positions shown · non-contrast
Comparison: Head MRI, head and neck MRA today reported separately.
PET-CT 08/27/2017.

CLINICAL DATA: 72-year-old female with dizziness and gait
abnormality. Vertigo beginning about 2 weeks ago. Vomiting.
Headache. Neck pain. History of large B-cell lymphoma.

EXAM:
MRI CERVICAL SPINE WITHOUT CONTRAST
TECHNIQUE: Multiplanar, multisequence MR imaging of the cervical spine was
performed. No intravenous contrast was administered.

[Series 5: T1 · sagittal · 3.0mm · 0.69mm/px · 6 of 15 slices shown]
[im 1/15]
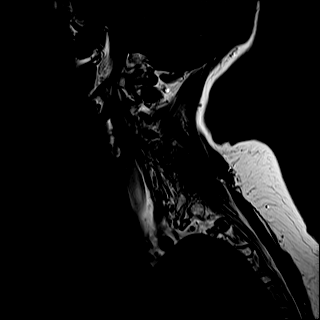
[im 3/15]
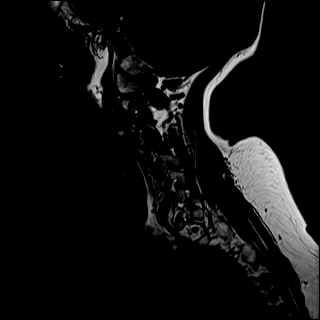
[im 6/15]
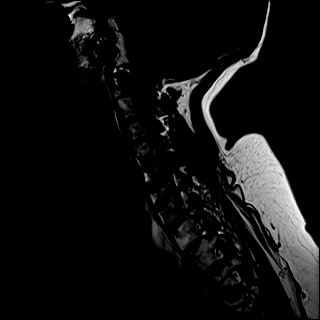
[im 9/15]
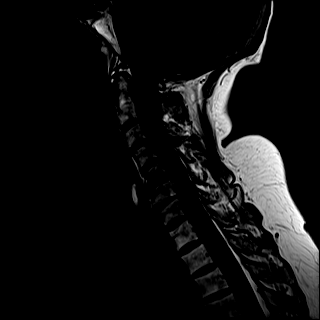
[im 12/15]
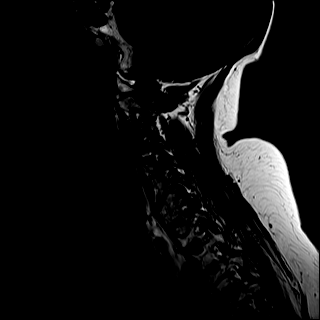
[im 15/15]
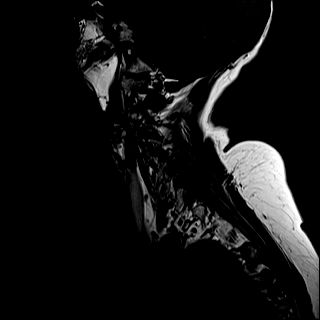

[Series 6: T2 · sagittal · 3.0mm · 0.69mm/px · 6 of 15 slices shown (1 of 2)]
[im 1/15]
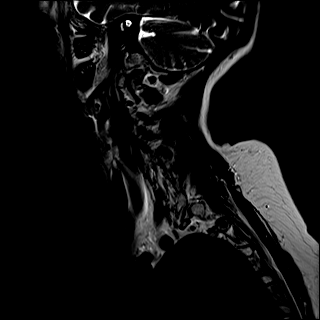
[im 3/15]
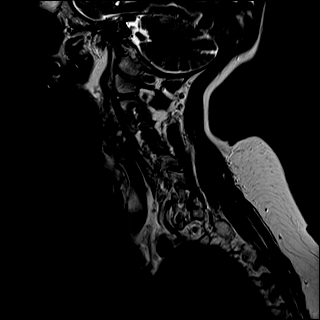
[im 6/15]
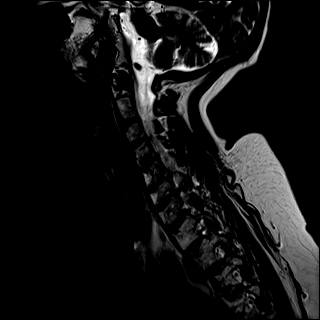
[im 9/15]
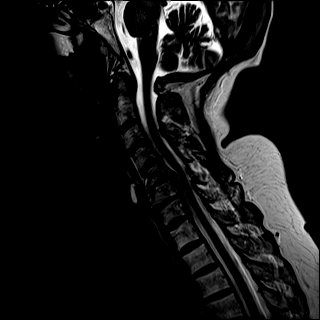
[im 12/15]
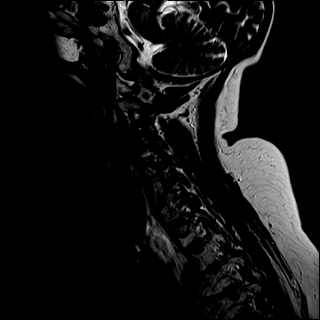
[im 15/15]
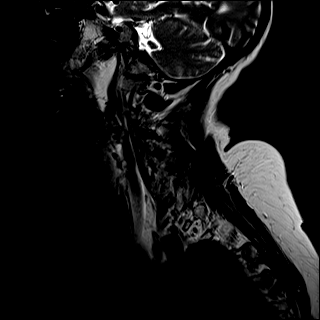

[Series 7: STIR · sagittal · 3.0mm · 0.86mm/px · 6 of 15 slices shown]
[im 1/15]
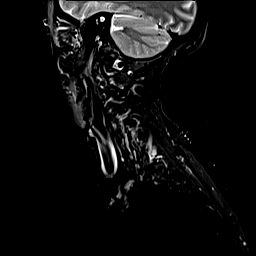
[im 3/15]
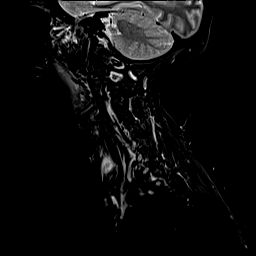
[im 6/15]
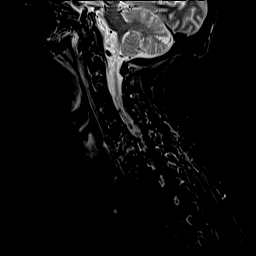
[im 9/15]
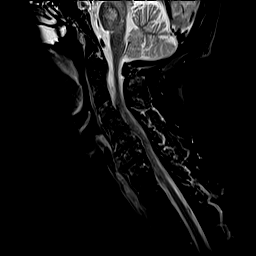
[im 12/15]
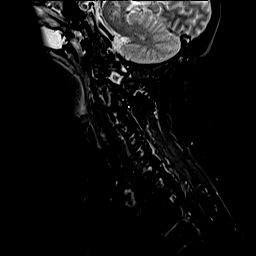
[im 15/15]
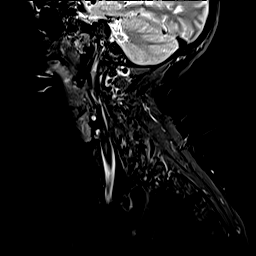

[Series 8: T2 · axial · 3.0mm · 0.66mm/px · z∈[-214,-102]mm · 9 of 40 slices shown (2 of 2)]
[im 1/40]
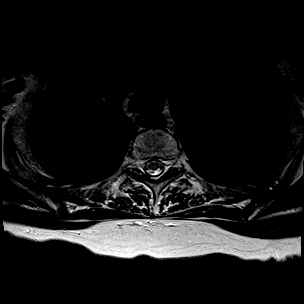
[im 6/40]
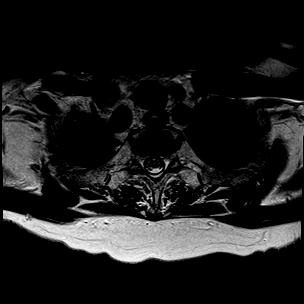
[im 12/40]
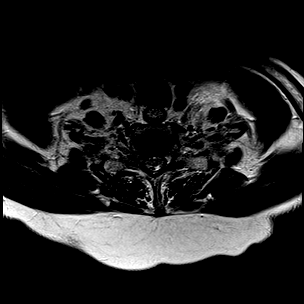
[im 17/40]
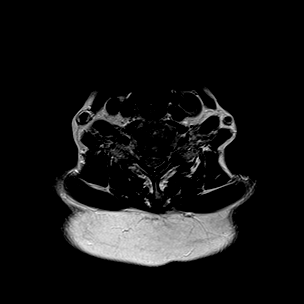
[im 20/40]
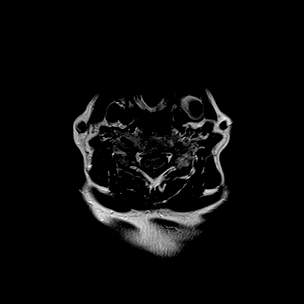
[im 23/40]
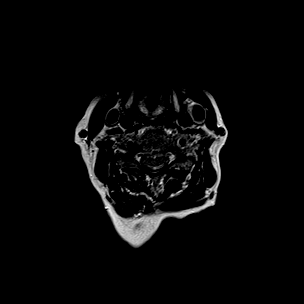
[im 28/40]
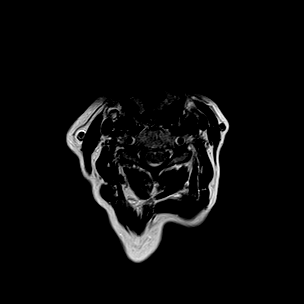
[im 34/40]
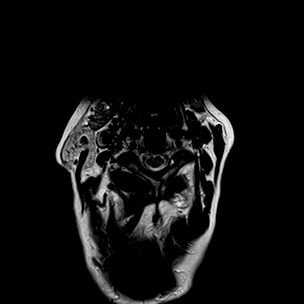
[im 40/40]
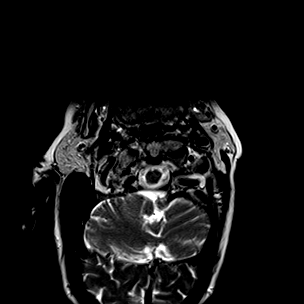

[Series 9: GRE · axial · 3.0mm · 0.39mm/px · z∈[-214,-102]mm · 8 of 40 slices shown]
[im 1/40]
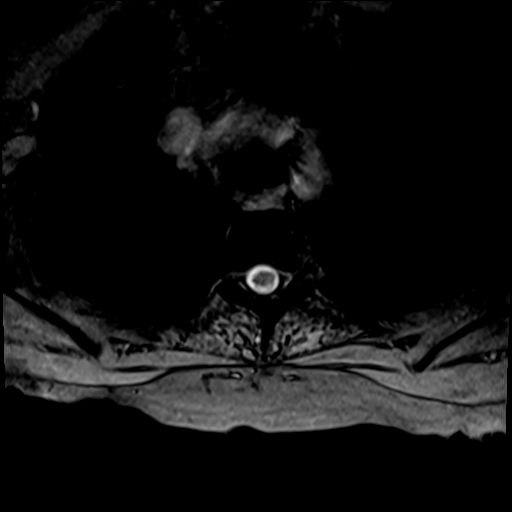
[im 6/40]
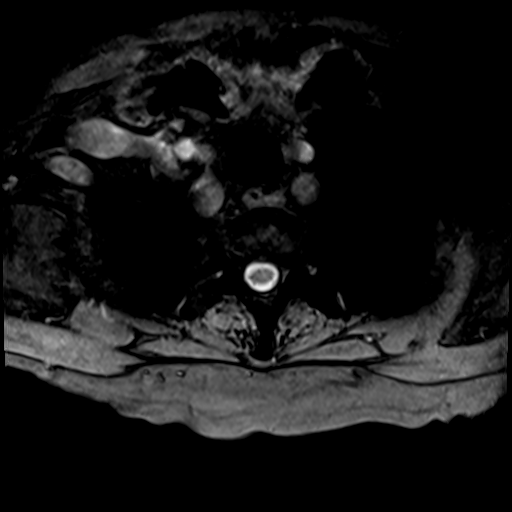
[im 12/40]
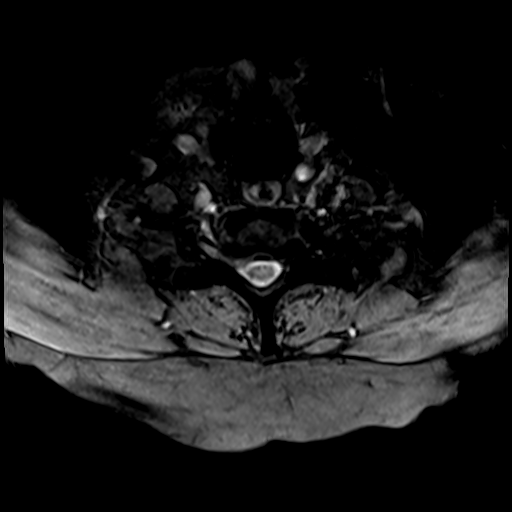
[im 17/40]
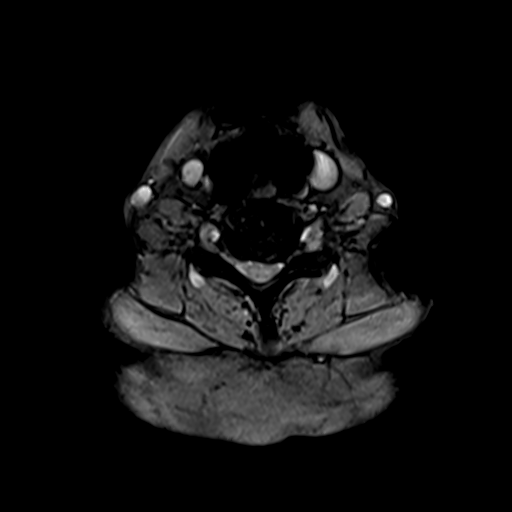
[im 23/40]
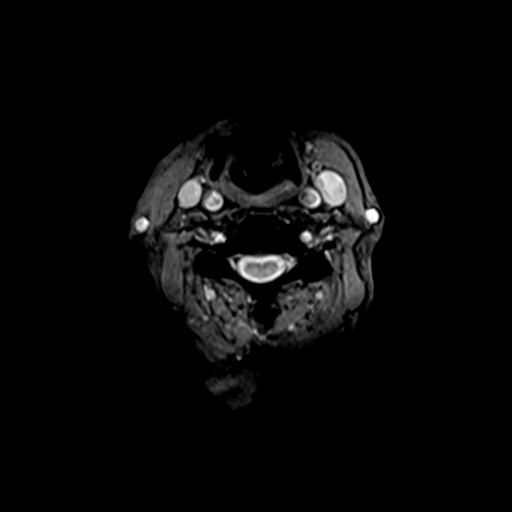
[im 28/40]
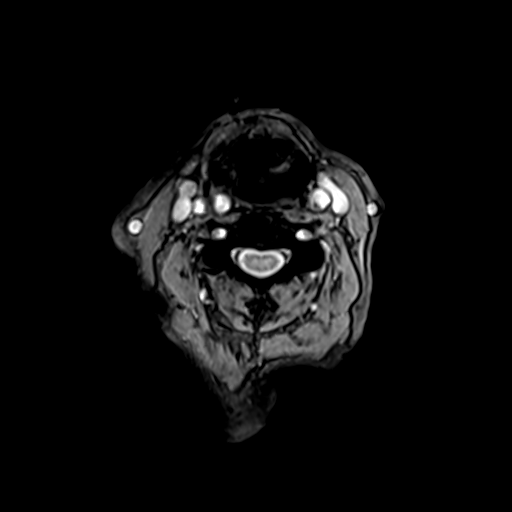
[im 34/40]
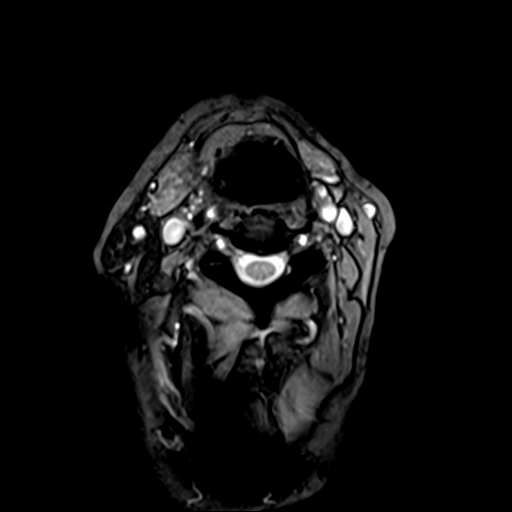
[im 40/40]
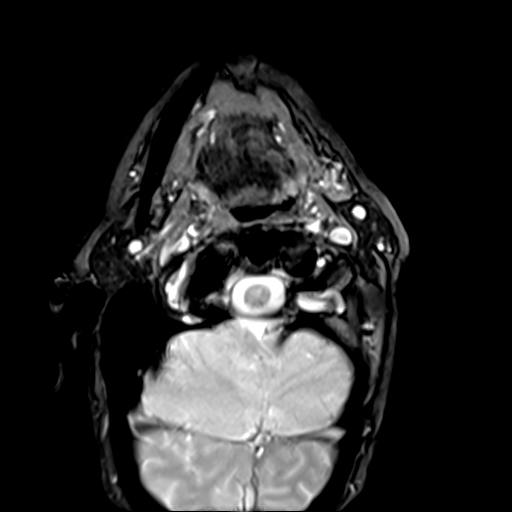

[35 of 48 positions shown; findings below may reference images not displayed]

FINDINGS: Alignment: Straightening and mild reversal of cervical lordosis. No
spondylolisthesis.

Vertebrae: Aside from what appear to be degenerative marrow signal
changes at the C5 through C7 levels, bone marrow signal is within
normal limits. Patchy degenerative marrow edema is suspected about
the chronically degenerated C5-C6 and C6-C7 disc spaces. Mild
chronic T1 superior endplate deformity. No destructive or suspicious
marrow lesion identified.

Cord: Spinal cord signal is within normal limits at all visualized
levels.

Posterior Fossa, vertebral arteries, paraspinal tissues: The major
vascular flow voids in the neck appear symmetric and normal.

Brain MRI reported separately today.

Negative neck soft tissues.  Negative visible lung apices.

Disc levels:

C2-C3:  Mild facet hypertrophy.

C3-C4: Mild disc bulge, facet and ligament flavum hypertrophy. Mild
spinal stenosis. Broad-based left paracentral component of disc on
series 9, image [DATE] result in mild spinal cord mass effect, but
there is no cord signal abnormality.

C4-C5: Mild disc bulge and endplate spurring. Mild facet
hypertrophy. No significant stenosis.

C5-C6: Disc space loss with bulky circumferential disc osteophyte
complex. Right paracentral posterior component. Mild facet and
ligament flavum hypertrophy. Spinal stenosis with mild right hemi
cord mass effect. Mild to moderate bilateral C6 foraminal stenosis.

C6-C7: Disc space loss with bulky circumferential disc osteophyte
complex.. Spinal mild to moderate ligament flavum and mild facet
hypertrophy stenosis with mild left hemi cord mass effect. Moderate
bilateral C7 foraminal stenosis.

C7-T1: Mild disc bulge with suspicion of a superimposed right
subarticular disc protrusion (series 6, image 6) located at the
proximal right C8 neural foramen. Mild facet and ligament flavum
hypertrophy. No spinal stenosis. Mild contralateral left C8
foraminal stenosis.

No upper thoracic stenosis.
IMPRESSION: 1. Advanced chronic disc and endplate degeneration C5-C6 and C6-C7
with mild spinal stenosis and spinal cord mass effect. No cord
signal abnormality. Lesser degenerative spinal stenosis and cord
mass effect at C3-C4.
2. Suspected small right subarticular disc herniation at C7-T1.
Query right C8 radiculitis.
3. Degenerative marrow signal changes in the spine with no osseous
lesions suspicious for lymphoma.

## 2020-02-29 IMAGING — MR MR MRA NECK WO/W CM
1 series · 17 of 48 positions shown · IV contrast (gadavist)
Comparison: Cervical spine MRI today reported separately.

MRI, MRA head and neck 08/08/2003.

Brain MRI 09/12/2010.

CLINICAL DATA: 72-year-old female with dizziness and gait
abnormality. Vertigo beginning about 2 weeks ago. Vomiting.
Headache. Neck pain. History of lymphoma.

EXAM:
MRI HEAD WITHOUT AND WITH CONTRAST
MRA HEAD WITHOUT CONTRAST
MRA NECK WITHOUT AND WITH CONTRAST
TECHNIQUE: Multiplanar, multiecho pulse sequences of the brain and surrounding
structures were obtained without and with intravenous contrast.
Angiographic images of the Circle of Willis were obtained using MRA
technique without intravenous contrast. Angiographic images of the
neck were obtained using MRA technique without and with intravenous
contrast. Carotid stenosis measurements (when applicable) are
obtained utilizing NASCET criteria, using the distal internal
carotid diameter as the denominator.
CONTRAST:  10 milliliters Gadavist

[Series 100: 3d cow · axial · 0.5mm · 0.41mm/px · z∈[-28,+51]mm · 17 of 172 slices shown]
[im 1/172]
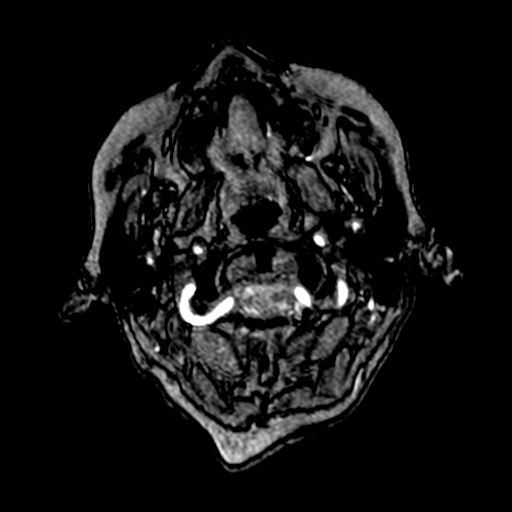
[im 4/172]
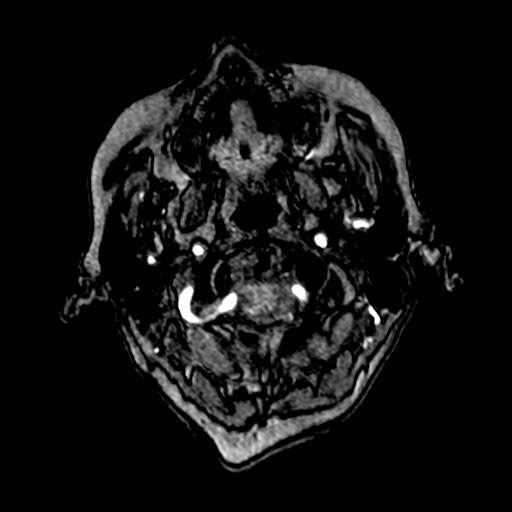
[im 8/172]
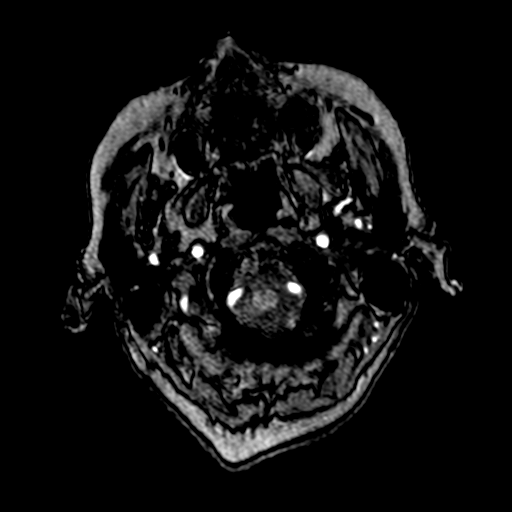
[im 11/172]
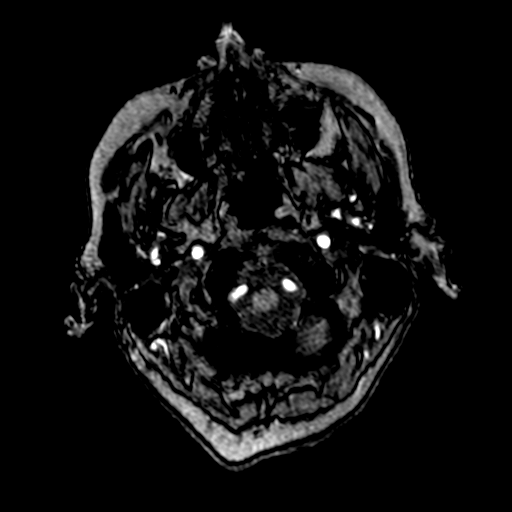
[im 15/172]
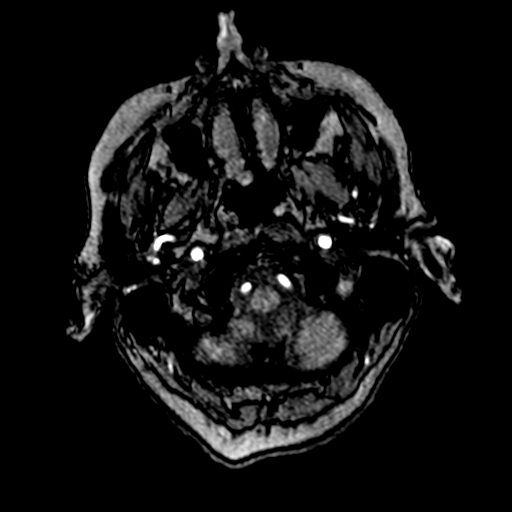
[im 19/172]
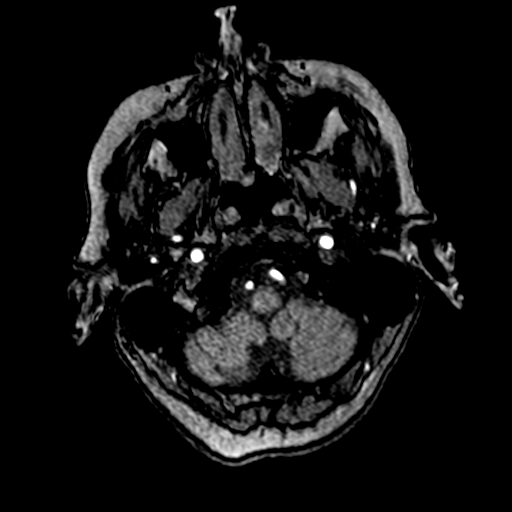
[im 22/172]
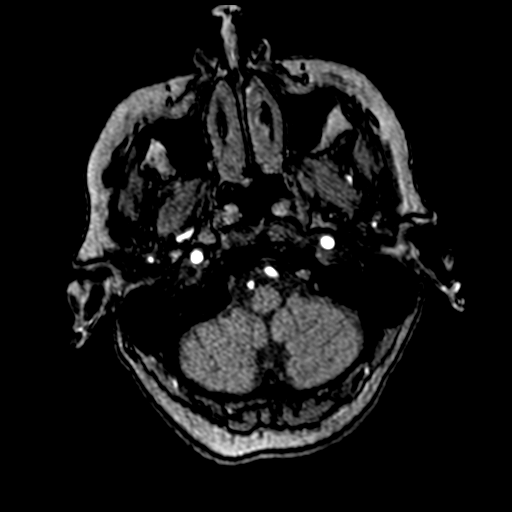
[im 30/172]
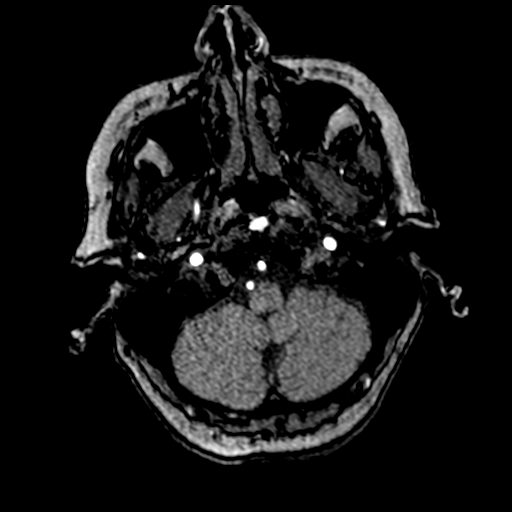
[im 33/172]
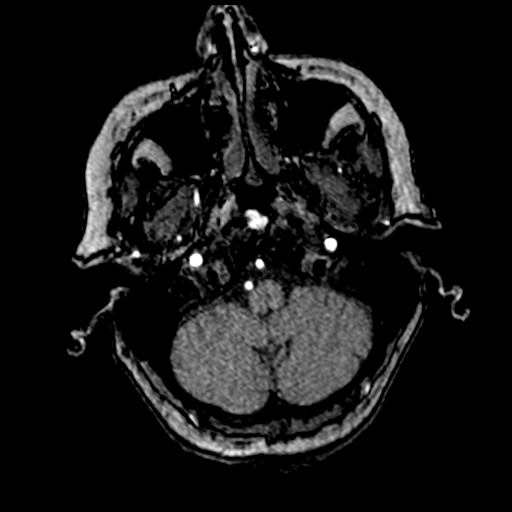
[im 55/172]
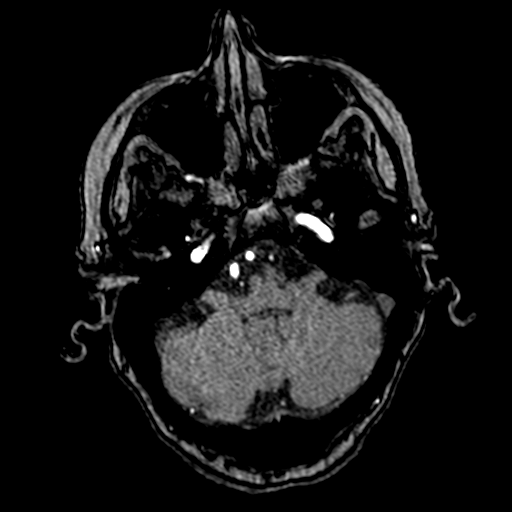
[im 77/172]
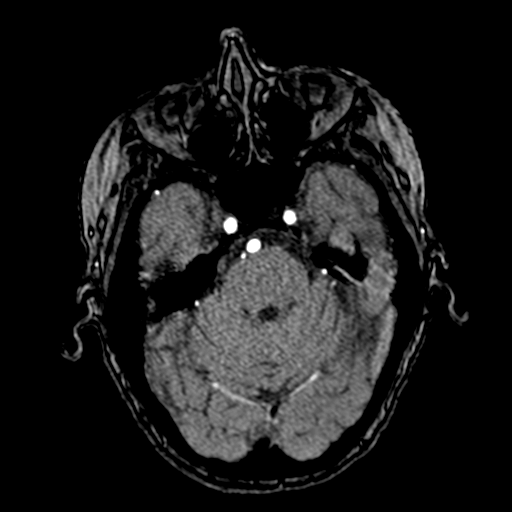
[im 88/172]
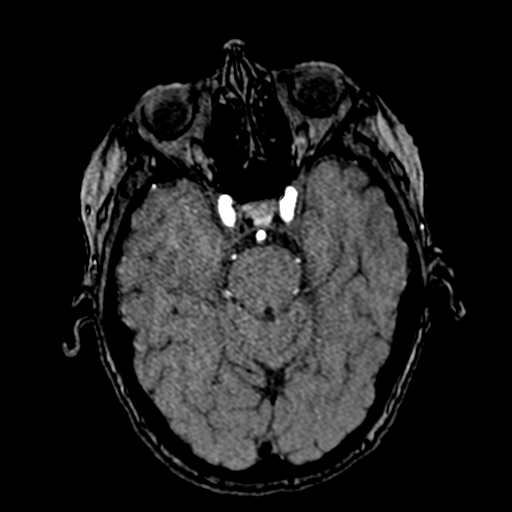
[im 99/172]
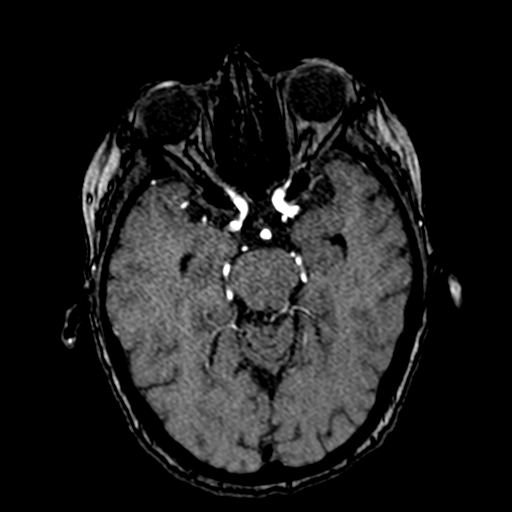
[im 121/172]
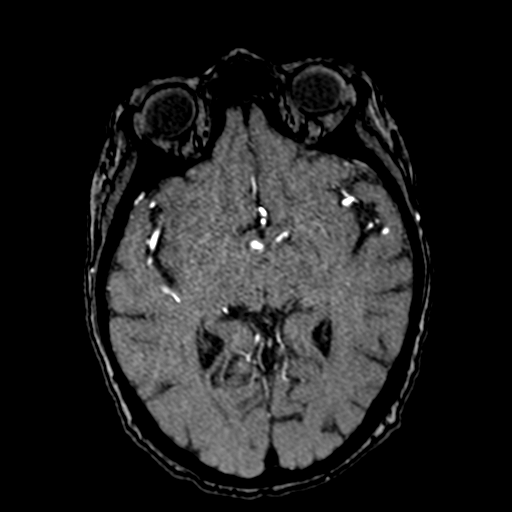
[im 142/172]
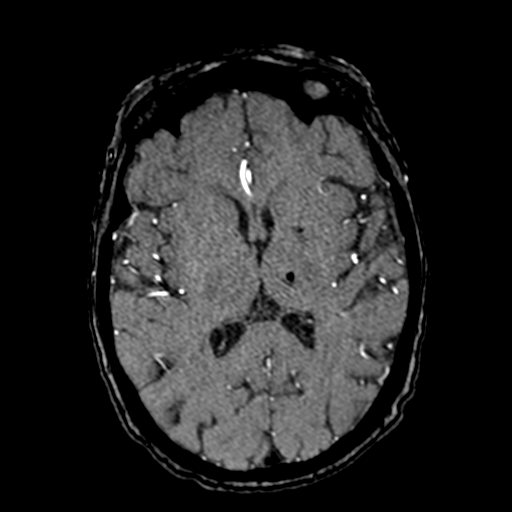
[im 146/172]
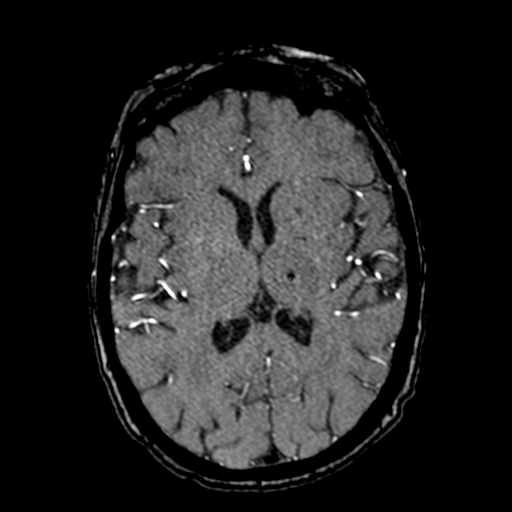
[im 164/172]
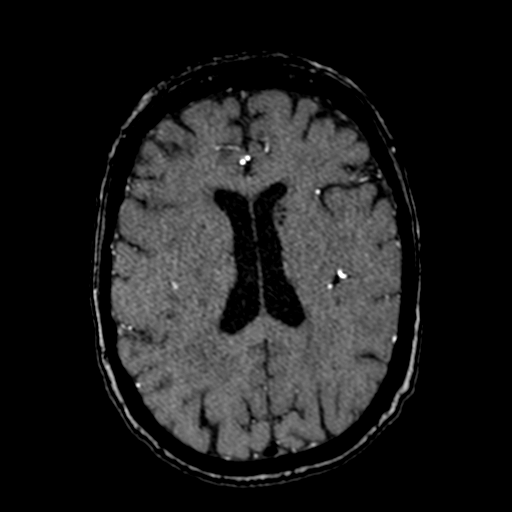

[17 of 48 positions shown; findings below may reference images not displayed]

FINDINGS: MRI HEAD FINDINGS

Brain: Cerebral volume has not significantly changed since 8322.

No restricted diffusion or evidence of acute infarction. Chronic
microhemorrhage in the left thalamus is new since 8322. Relatively
mild for age superimposed T2 heterogeneity in the bilateral deep
gray matter nuclei is stable. No chronic infarct or microhemorrhage
in the cerebellum.

Moderate patchy T2 and FLAIR hyperintensity in the pons, and
confluent involvement in the bilateral cerebral white matter has
mildly progressed since 8322 and is in a nonspecific configuration.

No superimposed cortical encephalomalacia. There are occasional
other chronic micro hemorrhages, such as in the left parietal lobe
series 17, image 36.

No abnormal enhancement identified.  No dural thickening.

No midline shift, mass effect, evidence of mass lesion,
ventriculomegaly, extra-axial collection or acute intracranial
hemorrhage. Cervicomedullary junction and pituitary are within
normal limits.

Vascular: Major intracranial vascular flow voids are stable since
5113. The major dural venous sinuses are enhancing and appear
patent.

Skull and upper cervical spine: Cervical spine reported separately
today. Visualized bone marrow signal is within normal limits.

Sinuses/Orbits: Postoperative changes to the left globe since 8322.
Otherwise stable and negative.

Other: Mastoids remain clear. Visible internal auditory structures
appear normal.

Chronic Tornwaldt cyst of the nasopharynx (normal variant). Scalp
and face soft tissues appear negative.

MRA NECK FINDINGS

Precontrast time-of-flight images reveal antegrade flow in both
cervical and carotid vertebral arteries. The vertebral arteries are
codominant. The carotid bifurcations appear within normal limits.

Post-contrast neck MRA images are mildly degraded by early contrast
bolus timing. Bovine type arch configuration redemonstrated.

Negative right CCA aside from mild tortuosity. Negative right
carotid bifurcation and cervical right ICA.

Negative left CCA, left carotid bifurcation and cervical left ICA.

Both vertebral artery origins are patent. The bilateral V1 segments
are chronically tortuous. The vertebral arteries appear codominant
and within normal limits to the skull base.

MRA HEAD FINDINGS

Antegrade flow in the posterior circulation with stable distal
vertebral arteries since 5113. No distal vertebral or basilar
stenosis. Both a ICAs appear dominant and patent. Normal SCA and PCA
origins. Left posterior communicating artery is present while the
right is diminutive or absent. Bilateral PCA branches are stable and
within normal limits.

Antegrade flow in both ICA siphons appear stable since 5113. Mild
siphon irregularity without stenosis. Normal ophthalmic and left
posterior communicating artery origins. Patent carotid termini.
Normal MCA and ACA origins. Anterior communicating artery and
visible ACA branches are within normal limits. Bilateral MCA M1
segments, MCA bifurcations, and visible bilateral MCA branches
appear stable and within normal limits.
IMPRESSION: 1. No acute intracranial abnormality. No explanation for acute
headache or vertigo.
2. Head and Neck MRA appear stable since 5113 and normal for age.
3. Chronic small vessel disease has progressed since 8322, notably
interval micro-hemorrhage in the left thalamus.. Underlying advanced
chronic cerebral white matter signal changes.

## 2020-03-06 ENCOUNTER — Ambulatory Visit (INDEPENDENT_AMBULATORY_CARE_PROVIDER_SITE_OTHER): Payer: Medicare Other | Admitting: *Deleted

## 2020-03-06 DIAGNOSIS — I495 Sick sinus syndrome: Secondary | ICD-10-CM

## 2020-03-06 DIAGNOSIS — I4819 Other persistent atrial fibrillation: Secondary | ICD-10-CM

## 2020-03-06 LAB — CUP PACEART REMOTE DEVICE CHECK
Battery Remaining Longevity: 43 mo
Battery Voltage: 2.97 V
Brady Statistic AP VP Percent: 2.15 %
Brady Statistic AP VS Percent: 78.78 %
Brady Statistic AS VP Percent: 1.18 %
Brady Statistic AS VS Percent: 17.89 %
Brady Statistic RA Percent Paced: 80.34 %
Brady Statistic RV Percent Paced: 3.36 %
Date Time Interrogation Session: 20210614154024
Implantable Lead Implant Date: 20140618
Implantable Lead Implant Date: 20140618
Implantable Lead Location: 753859
Implantable Lead Location: 753860
Implantable Pulse Generator Implant Date: 20140618
Lead Channel Impedance Value: 1007 Ohm
Lead Channel Impedance Value: 342 Ohm
Lead Channel Impedance Value: 418 Ohm
Lead Channel Impedance Value: 950 Ohm
Lead Channel Pacing Threshold Amplitude: 0.875 V
Lead Channel Pacing Threshold Amplitude: 1 V
Lead Channel Pacing Threshold Pulse Width: 0.4 ms
Lead Channel Pacing Threshold Pulse Width: 0.4 ms
Lead Channel Sensing Intrinsic Amplitude: 0.75 mV
Lead Channel Sensing Intrinsic Amplitude: 0.75 mV
Lead Channel Sensing Intrinsic Amplitude: 9.125 mV
Lead Channel Sensing Intrinsic Amplitude: 9.125 mV
Lead Channel Setting Pacing Amplitude: 2.5 V
Lead Channel Setting Pacing Amplitude: 2.5 V
Lead Channel Setting Pacing Pulse Width: 0.4 ms
Lead Channel Setting Sensing Sensitivity: 0.9 mV

## 2020-03-07 NOTE — Progress Notes (Signed)
Remote pacemaker transmission.   

## 2020-03-12 IMAGING — CT CT ABD-PELV W/ CM
2 of 5 series · 12 of 36 positions shown, 15 images · IV contrast (OMNIPAQUE)
Comparison: 05/29/2018 chest CT.  11/17/2017 CT abdomen/pelvis.

CLINICAL DATA: Non-Hodgkin's lymphoma status post chemotherapy and
radiation therapy. Restaging. Additional history of left breast and
thyroid cancer.

EXAM:
CT CHEST, ABDOMEN, AND PELVIS WITH CONTRAST
TECHNIQUE: Multidetector CT imaging of the chest, abdomen and pelvis was
performed following the standard protocol during bolus
administration of intravenous contrast.
CONTRAST:  100mL OMNIPAQUE IOHEXOL 300 MG/ML  SOLN

[Series 2: cap with · axial · 0.81mm/px · z∈[+1131,+1646]mm · 9 of 127 slices shown, 12 images]
[im 12/127  mediastinal]
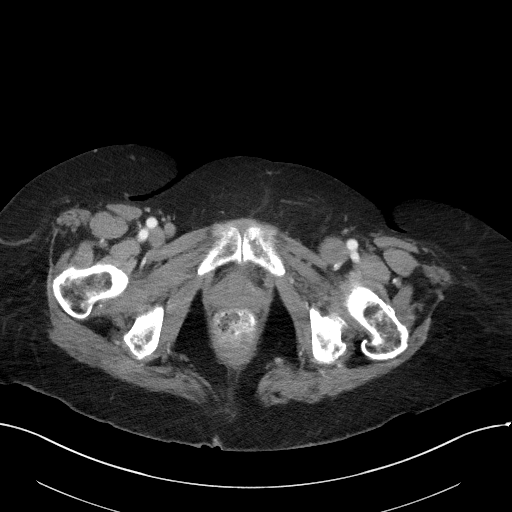
[im 12/127  lung]
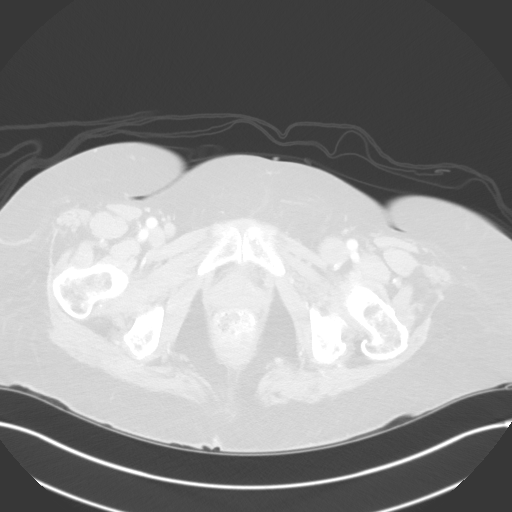
[im 23/127  lung]
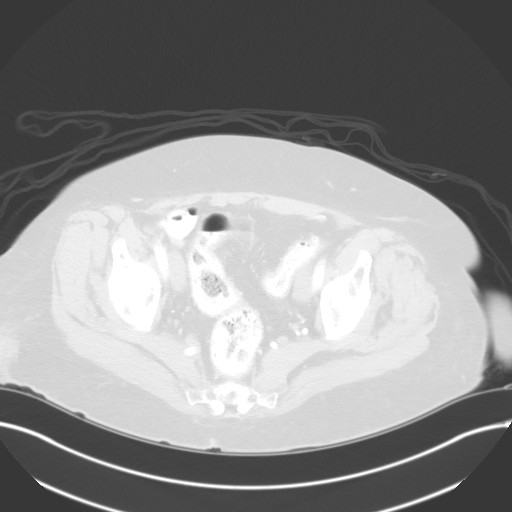
[im 35/127  lung]
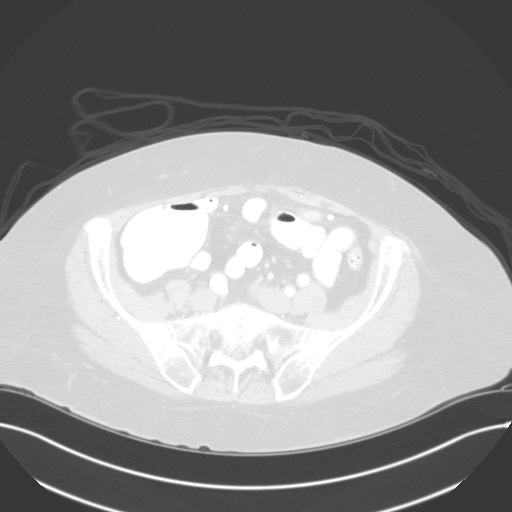
[im 46/127  lung]
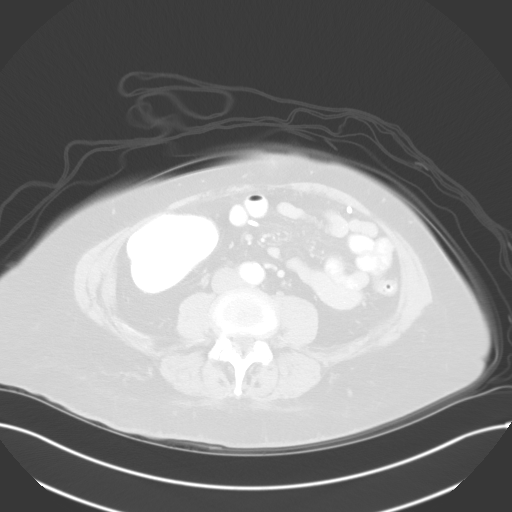
[im 69/127  mediastinal]
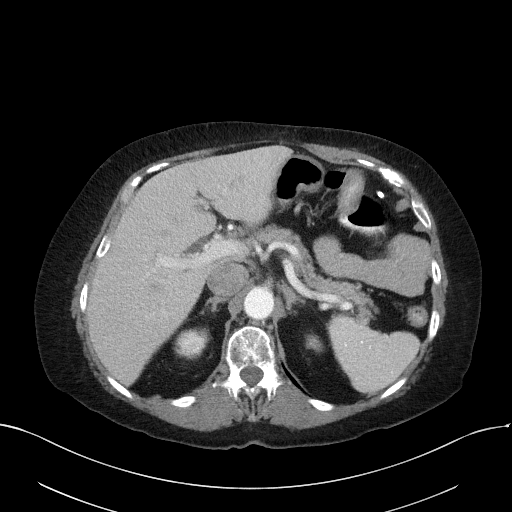
[im 69/127  lung]
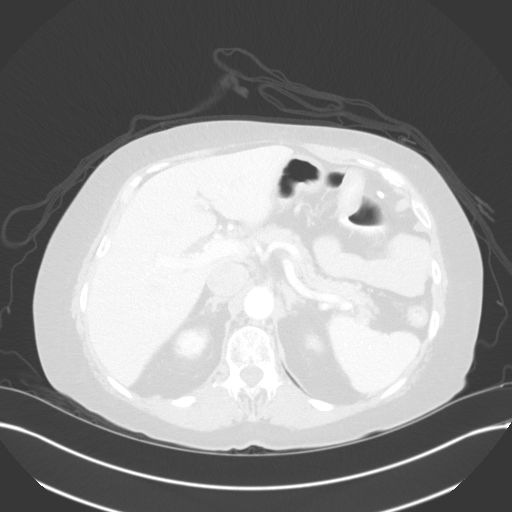
[im 81/127  lung]
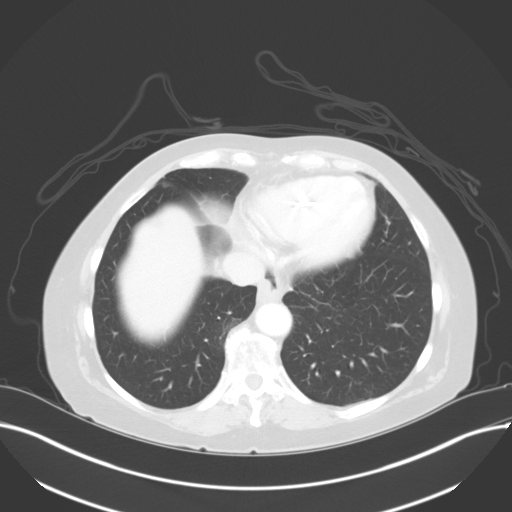
[im 92/127  lung]
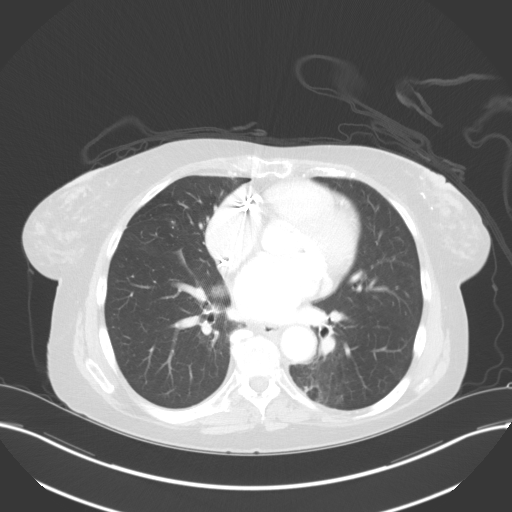
[im 104/127  lung]
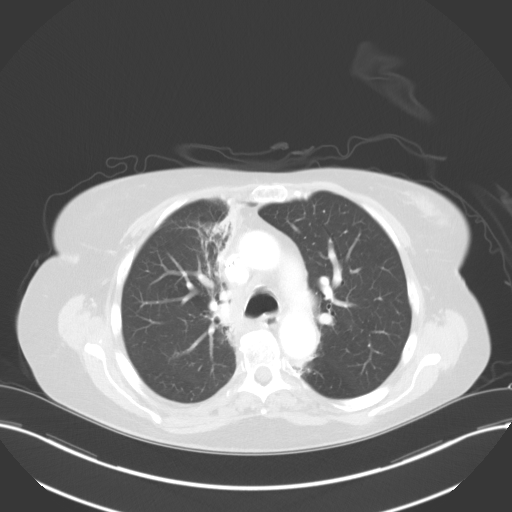
[im 115/127  mediastinal]
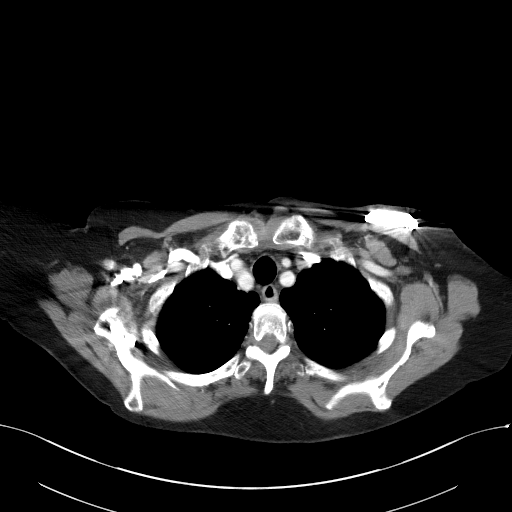
[im 115/127  lung]
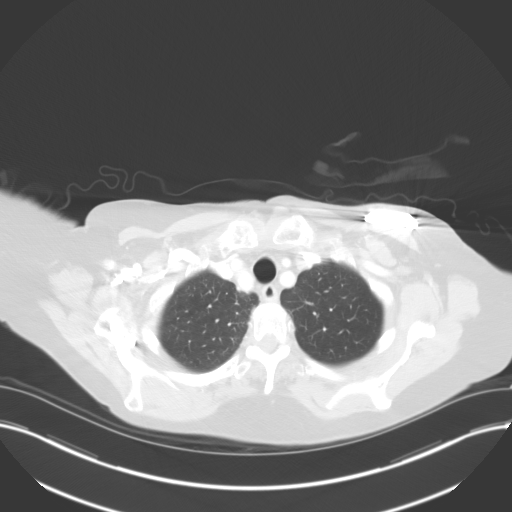

[Series 4: coronals · coronal · 0.93mm/px · 3 of 125 slices shown]
[im 25/125  lung]
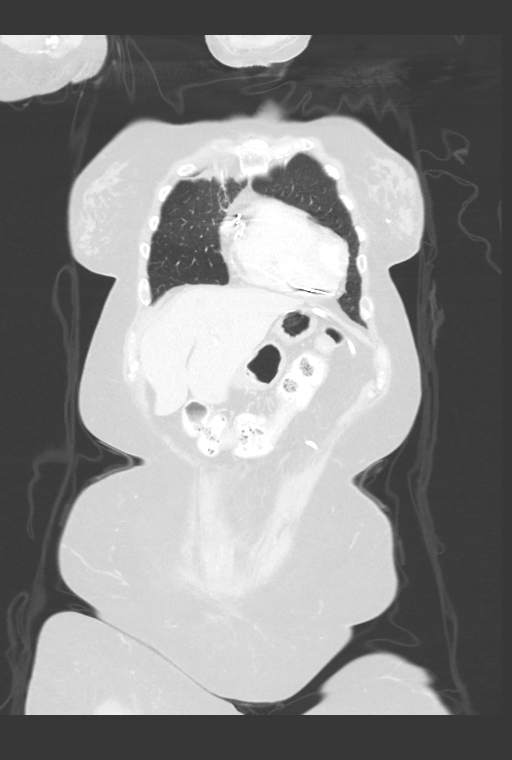
[im 50/125  lung]
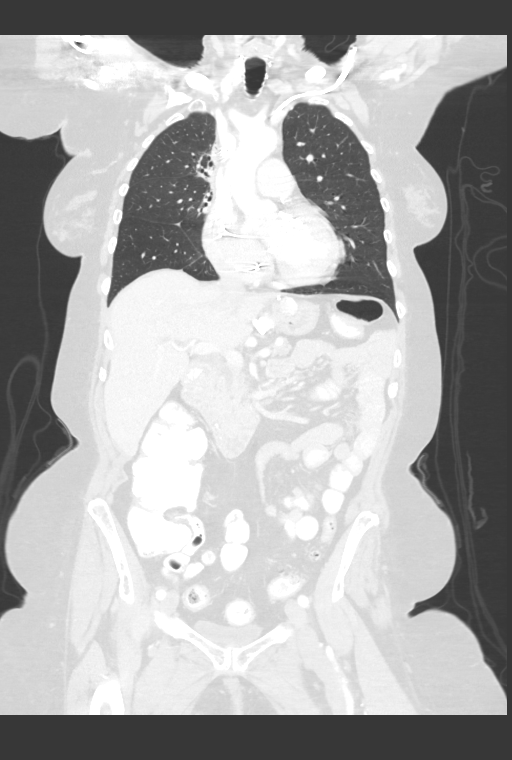
[im 75/125  lung]
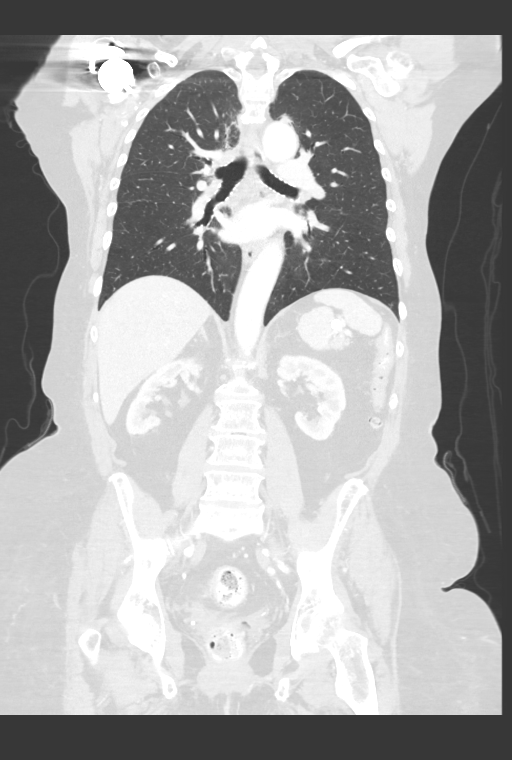

[12 of 36 positions shown; findings below may reference images not displayed]

FINDINGS: CT CHEST FINDINGS

Cardiovascular: Normal heart size. No significant pericardial
effusion/thickening. Two lead left subclavian pacemaker is noted
with lead tips in the right atrium and right ventricular apex. Left
mediastinal wire fragment lateral to the main pulmonary artery has
been stable since 02/03/2017 chest CT. Three-vessel coronary
atherosclerosis. Atherosclerotic nonaneurysmal thoracic aorta.
Normal caliber pulmonary arteries. No central pulmonary emboli.

Mediastinum/Nodes: Status post thyroidectomy. Unremarkable
esophagus. No axillary adenopathy. Previously described mildly
enlarged 1.2 cm right paratracheal node on the the 05/29/2018
noncontrast chest CT study correlates with the azygos vein on
today's contrast-enhanced scan. Mildly enlarged 1.2 cm subcarinal
node (series 2/image 31) is stable. No new pathologically enlarged
mediastinal nodes. No hilar adenopathy.

Lungs/Pleura: No pneumothorax. Trace dependent right pleural
effusion is stable. No left pleural effusion. Clustered nodularity
in the posterior right lower lobe is overall mildly increased with a
new 1.3 cm solid nodule at the inferior margin of the cluster
(series 6/image 91) and with a stable 1.3 cm nodule at the superior
margin of the cluster (series 6/image 85). Additional scattered
solid pulmonary nodules in both lungs measuring up to the 7 mm in
the left lower lobe (series 6/image 85) are all stable since
05/27/2017 chest CT and are more likely benign. No additional new
significant pulmonary nodules. Sharply marginated bilateral
paramediastinal consolidation in the mid to upper lungs with
associated volume loss, distortion and bronchiectasis, unchanged,
compatible with radiation fibrosis.

Musculoskeletal: No aggressive appearing focal osseous lesions. Mild
thoracic spondylosis. Surgical clips are again noted in the left
breast.

CT ABDOMEN PELVIS FINDINGS

Hepatobiliary: Normal liver size. Stable simple 1.1 cm left liver
lobe cyst. No new liver lesions. Cholecystectomy. No intrahepatic
biliary ductal dilatation. CBD diameter 6 mm, stable and within
normal post cholecystectomy limits.

Pancreas: Normal, with no mass or duct dilation.

Spleen: Normal size spleen. Stable granulomatous splenic
calcifications. No splenic mass.

Adrenals/Urinary Tract: Normal adrenals. No hydronephrosis.
Nonobstructing stones in the lower left kidney, largest 6 mm. No
renal masses. Normal bladder.

Stomach/Bowel: Stable small hiatal hernia. Laparoscopic gastric band
is in stable position in the proximal stomach with intact appearing
tubing connecting to subcutaneous port in the ventral midline
abdominal wall. Stomach is nondistended. No acute gastric
abnormality. Normal caliber small bowel with no small bowel wall
thickening. Appendectomy. Mild sigmoid diverticulosis. No large
bowel wall thickening or acute pericolonic fat stranding. Oral
contrast transits to the rectum.

Vascular/Lymphatic: Atherosclerotic nonaneurysmal abdominal aorta.
Patent portal, splenic, hepatic and renal veins. No pathologically
enlarged lymph nodes in the abdomen or pelvis.

Reproductive: Status post hysterectomy, with no abnormal findings at
the vaginal cuff. No adnexal mass.

Other: No pneumoperitoneum, ascites or focal fluid collection.
Ill-defined subcutaneous fluid and fat stranding in the right
gluteal region (series 2/image 103), partially visualized.

Musculoskeletal: No aggressive appearing focal osseous lesions.
Moderate lumbar spondylosis.
IMPRESSION: 1. Indeterminate clustered nodularity in the posterior right lower
lung lobe is overall mildly increased. Given the waxing and waning
behavior on multiple chest CT studies, chronic/recurrent infectious
or inflammatory process is favored. Pulmonary lymphoma is considered
less likely. Continued chest CT surveillance is warranted.
2. Stable mild mediastinal adenopathy. No new or progressive
adenopathy in the chest.
3. Stable trace dependent right pleural effusion.
4. No adenopathy or other findings of lymphoma in the abdomen or
pelvis. Normal size spleen.
5.  Aortic Atherosclerosis ([id number]).

## 2020-03-23 IMAGING — MR MR LUMBAR SPINE W/O CM
4 of 5 series · 28 of 48 positions shown · non-contrast
Comparison: CT chest, abdomen and pelvis 08/10/2018.

CLINICAL DATA: Ataxia possibly due to Parkinson's disease. Vertigo,
lower extremity weakness and ataxia for 2-3 months.

EXAM:
MRI LUMBAR SPINE WITHOUT CONTRAST
TECHNIQUE: Multiplanar, multisequence MR imaging of the lumbar spine was
performed. No intravenous contrast was administered.

[Series 5: T2 · sagittal · 4.0mm · 0.73mm/px · 8 of 15 slices shown (1 of 2)]
[im 1/15]
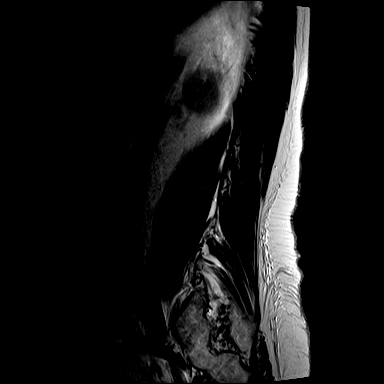
[im 3/15]
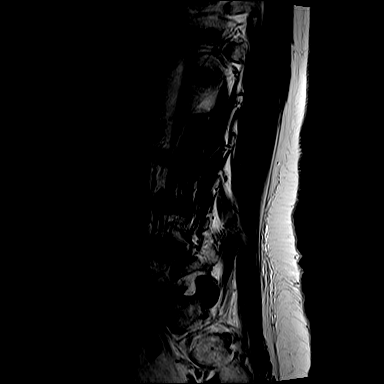
[im 5/15]
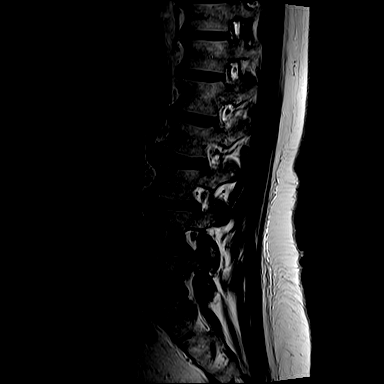
[im 7/15]
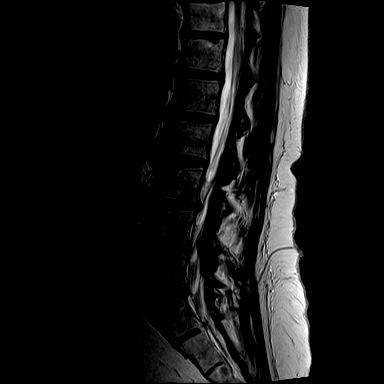
[im 9/15]
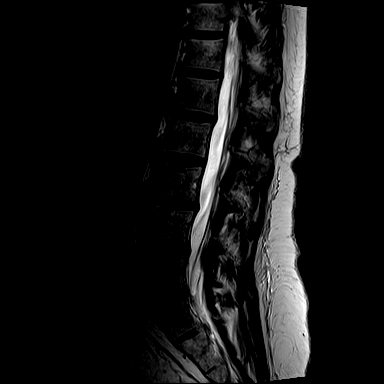
[im 11/15]
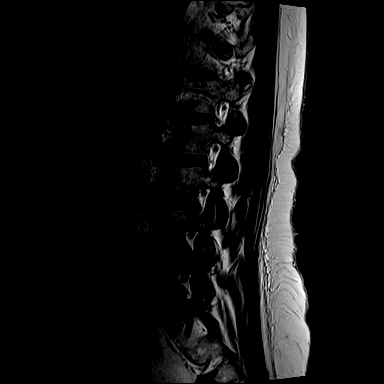
[im 13/15]
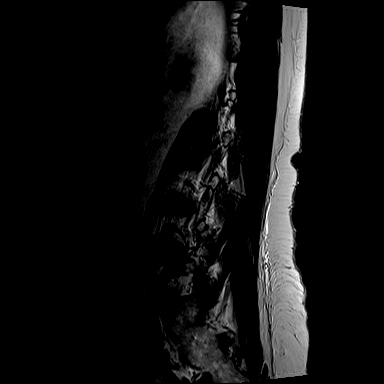
[im 15/15]
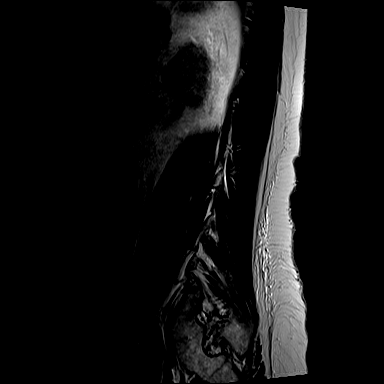

[Series 7: T1 · sagittal · 4.0mm · 0.88mm/px · 7 of 15 slices shown (1 of 2)]
[im 1/15]
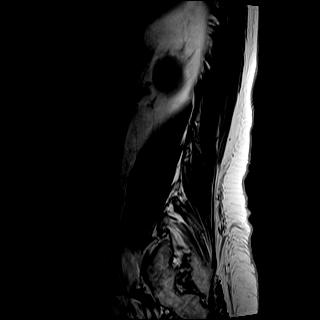
[im 3/15]
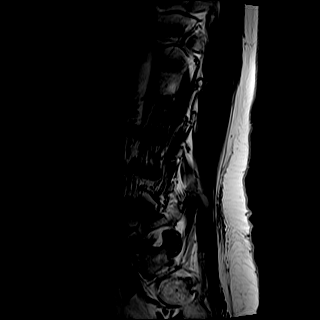
[im 5/15]
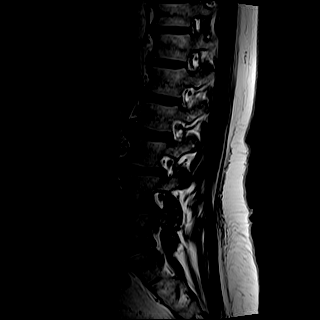
[im 8/15]
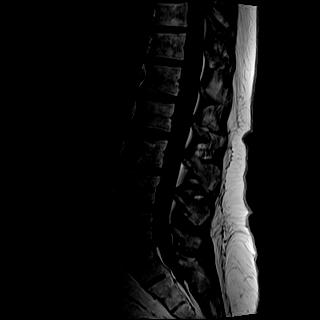
[im 10/15]
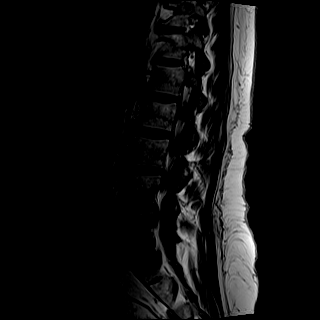
[im 12/15]
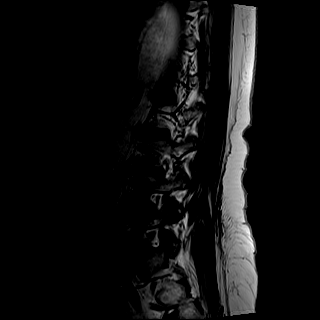
[im 15/15]
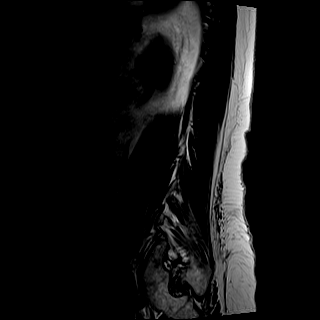

[Series 8: T2 · axial · 4.0mm · 0.57mm/px · z∈[-58,+104]mm · 9 of 28 slices shown (2 of 2)]
[im 1/28]
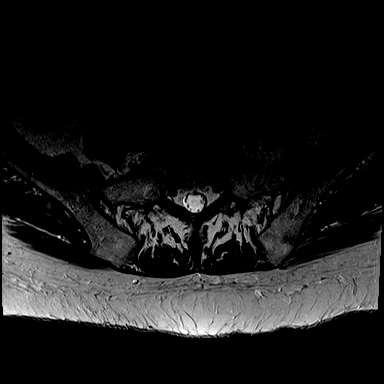
[im 5/28]
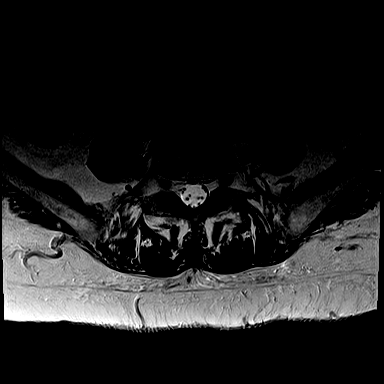
[im 10/28]
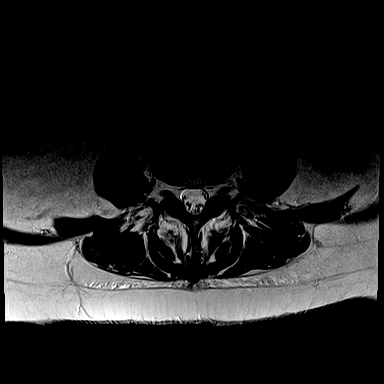
[im 12/28]
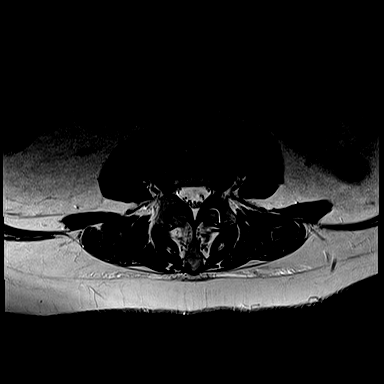
[im 14/28]
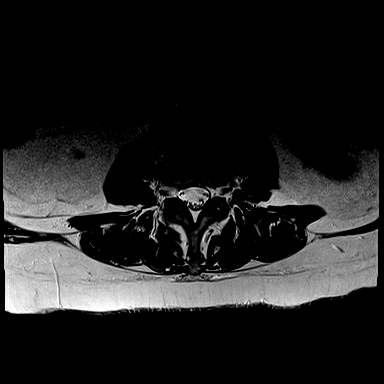
[im 16/28]
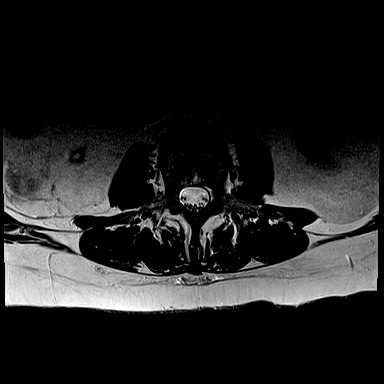
[im 19/28]
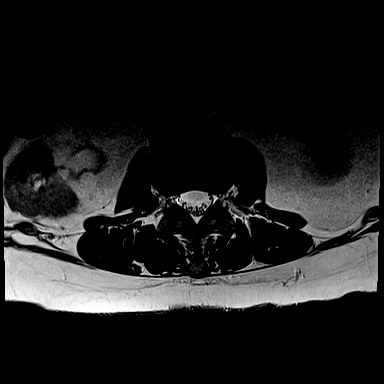
[im 23/28]
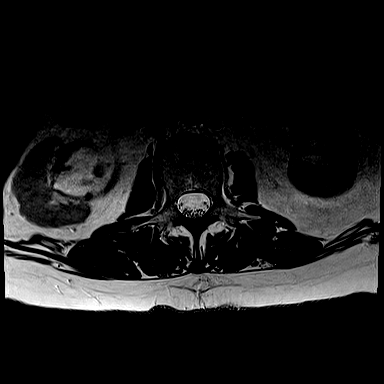
[im 28/28]
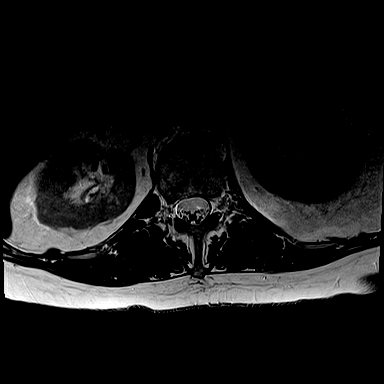

[Series 9: T1 · axial · 4.0mm · 0.34mm/px · z∈[-58,+79]mm · 4 of 28 slices shown (2 of 2)]
[im 1/28]
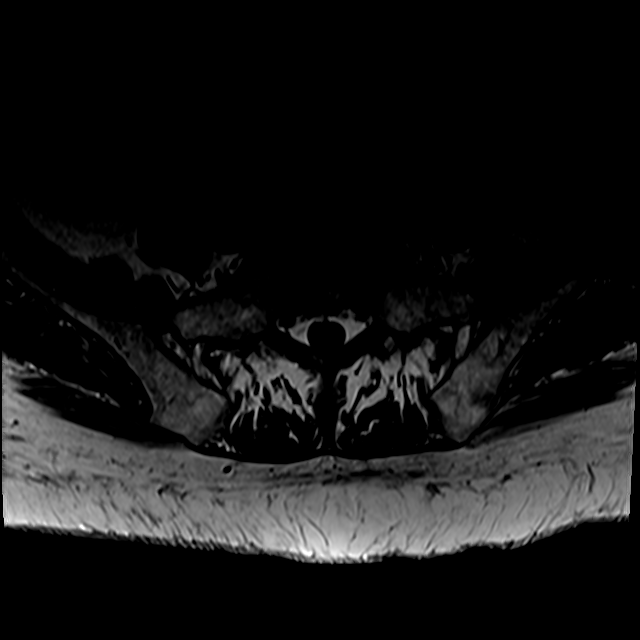
[im 5/28]
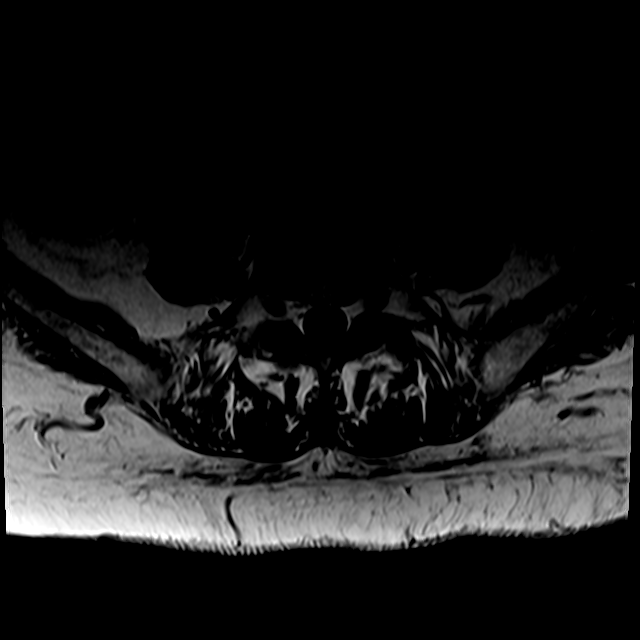
[im 14/28]
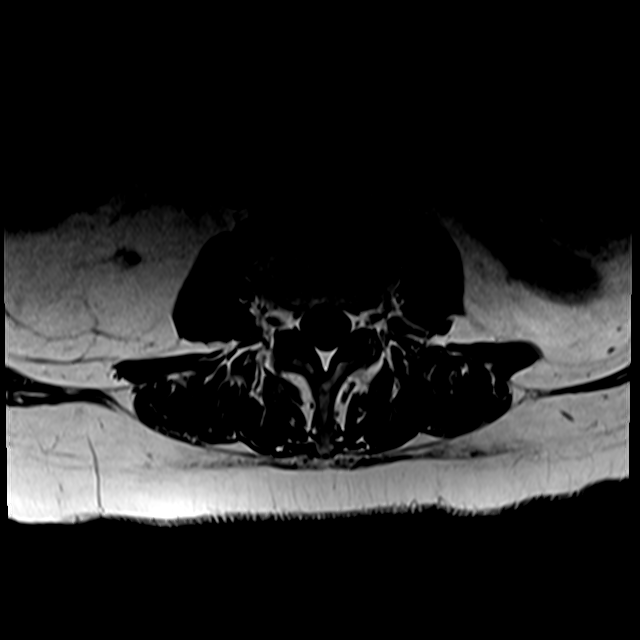
[im 23/28]
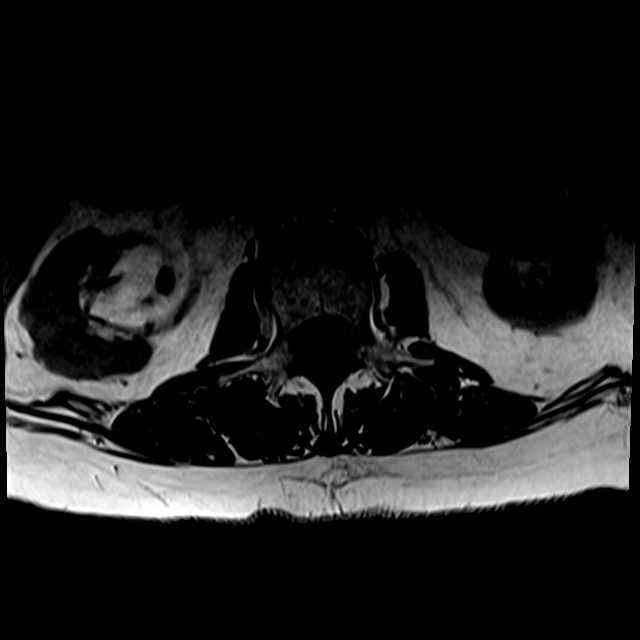

[28 of 48 positions shown; findings below may reference images not displayed]

FINDINGS: Segmentation:  Standard.

Alignment:  Maintained with mild straightening of lordosis noted.

Vertebrae:  No fracture or worrisome lesion.

Conus medullaris and cauda equina: Conus extends to the L1 level.
Conus and cauda equina appear normal.

Paraspinal and other soft tissues: Negative.

Disc levels:

T11-12 and T12-L1 are imaged in the sagittal plane only and
negative.

L1-2: Negative.

L2-3: Negative.

L3-4: Minimal disc bulge without stenosis.

L4-5: Mild-to-moderate facet degenerative disease, very shallow disc
bulge and ligamentum flavum thickening. No stenosis.

L5-S1: Mild-to-moderate facet degenerative change and a minimal disc
bulge. No stenosis.
IMPRESSION: Mild lumbar degenerative disease without central canal or foraminal
narrowing. No finding to explain the patient's symptoms.

## 2020-03-26 ENCOUNTER — Other Ambulatory Visit: Payer: Self-pay | Admitting: Internal Medicine

## 2020-04-11 ENCOUNTER — Ambulatory Visit: Payer: Medicare Other | Admitting: Podiatry

## 2020-04-12 DIAGNOSIS — I700 Atherosclerosis of aorta: Secondary | ICD-10-CM | POA: Diagnosis not present

## 2020-04-12 DIAGNOSIS — I2510 Atherosclerotic heart disease of native coronary artery without angina pectoris: Secondary | ICD-10-CM | POA: Diagnosis not present

## 2020-04-12 DIAGNOSIS — M546 Pain in thoracic spine: Secondary | ICD-10-CM | POA: Diagnosis not present

## 2020-04-12 DIAGNOSIS — N1831 Chronic kidney disease, stage 3a: Secondary | ICD-10-CM | POA: Diagnosis not present

## 2020-04-12 DIAGNOSIS — C73 Malignant neoplasm of thyroid gland: Secondary | ICD-10-CM | POA: Diagnosis not present

## 2020-04-12 DIAGNOSIS — E7849 Other hyperlipidemia: Secondary | ICD-10-CM | POA: Diagnosis not present

## 2020-04-12 DIAGNOSIS — I951 Orthostatic hypotension: Secondary | ICD-10-CM | POA: Diagnosis not present

## 2020-04-12 DIAGNOSIS — R7301 Impaired fasting glucose: Secondary | ICD-10-CM | POA: Diagnosis not present

## 2020-04-12 DIAGNOSIS — D126 Benign neoplasm of colon, unspecified: Secondary | ICD-10-CM | POA: Diagnosis not present

## 2020-04-12 DIAGNOSIS — E890 Postprocedural hypothyroidism: Secondary | ICD-10-CM | POA: Diagnosis not present

## 2020-04-12 DIAGNOSIS — C884 Extranodal marginal zone B-cell lymphoma of mucosa-associated lymphoid tissue [MALT-lymphoma]: Secondary | ICD-10-CM | POA: Diagnosis not present

## 2020-04-12 DIAGNOSIS — M859 Disorder of bone density and structure, unspecified: Secondary | ICD-10-CM | POA: Diagnosis not present

## 2020-04-12 DIAGNOSIS — Z950 Presence of cardiac pacemaker: Secondary | ICD-10-CM | POA: Diagnosis not present

## 2020-04-18 ENCOUNTER — Encounter: Payer: Self-pay | Admitting: Gastroenterology

## 2020-04-27 DIAGNOSIS — C44319 Basal cell carcinoma of skin of other parts of face: Secondary | ICD-10-CM | POA: Diagnosis not present

## 2020-04-27 DIAGNOSIS — H0102B Squamous blepharitis left eye, upper and lower eyelids: Secondary | ICD-10-CM | POA: Diagnosis not present

## 2020-04-27 DIAGNOSIS — H0102A Squamous blepharitis right eye, upper and lower eyelids: Secondary | ICD-10-CM | POA: Diagnosis not present

## 2020-05-01 DIAGNOSIS — L905 Scar conditions and fibrosis of skin: Secondary | ICD-10-CM | POA: Diagnosis not present

## 2020-05-01 DIAGNOSIS — Z85828 Personal history of other malignant neoplasm of skin: Secondary | ICD-10-CM | POA: Diagnosis not present

## 2020-05-01 DIAGNOSIS — L570 Actinic keratosis: Secondary | ICD-10-CM | POA: Diagnosis not present

## 2020-05-11 DIAGNOSIS — H16143 Punctate keratitis, bilateral: Secondary | ICD-10-CM | POA: Diagnosis not present

## 2020-05-29 ENCOUNTER — Other Ambulatory Visit: Payer: Self-pay | Admitting: Internal Medicine

## 2020-05-29 DIAGNOSIS — R059 Cough, unspecified: Secondary | ICD-10-CM

## 2020-06-05 ENCOUNTER — Ambulatory Visit (INDEPENDENT_AMBULATORY_CARE_PROVIDER_SITE_OTHER): Payer: Medicare Other | Admitting: *Deleted

## 2020-06-05 DIAGNOSIS — I495 Sick sinus syndrome: Secondary | ICD-10-CM | POA: Diagnosis not present

## 2020-06-08 LAB — CUP PACEART REMOTE DEVICE CHECK
Battery Remaining Longevity: 38 mo
Battery Voltage: 2.96 V
Brady Statistic AP VP Percent: 0.66 %
Brady Statistic AP VS Percent: 68.47 %
Brady Statistic AS VP Percent: 0.29 %
Brady Statistic AS VS Percent: 30.58 %
Brady Statistic RA Percent Paced: 68.92 %
Brady Statistic RV Percent Paced: 0.96 %
Date Time Interrogation Session: 20210915214715
Implantable Lead Implant Date: 20140618
Implantable Lead Implant Date: 20140618
Implantable Lead Location: 753859
Implantable Lead Location: 753860
Implantable Pulse Generator Implant Date: 20140618
Lead Channel Impedance Value: 1007 Ohm
Lead Channel Impedance Value: 342 Ohm
Lead Channel Impedance Value: 418 Ohm
Lead Channel Impedance Value: 950 Ohm
Lead Channel Pacing Threshold Amplitude: 1 V
Lead Channel Pacing Threshold Amplitude: 1 V
Lead Channel Pacing Threshold Pulse Width: 0.4 ms
Lead Channel Pacing Threshold Pulse Width: 0.4 ms
Lead Channel Sensing Intrinsic Amplitude: 0.75 mV
Lead Channel Sensing Intrinsic Amplitude: 0.75 mV
Lead Channel Sensing Intrinsic Amplitude: 9 mV
Lead Channel Sensing Intrinsic Amplitude: 9 mV
Lead Channel Setting Pacing Amplitude: 2.5 V
Lead Channel Setting Pacing Amplitude: 2.5 V
Lead Channel Setting Pacing Pulse Width: 0.4 ms
Lead Channel Setting Sensing Sensitivity: 0.9 mV

## 2020-06-12 NOTE — Progress Notes (Signed)
Remote pacemaker transmission.   

## 2020-06-13 ENCOUNTER — Other Ambulatory Visit: Payer: Self-pay | Admitting: Internal Medicine

## 2020-06-13 NOTE — Telephone Encounter (Signed)
Eliquis 5mg  refill request received. Patient is 74 years old, weight-99.8kg, Crea-1.06 on 12/04/2019, Diagnosis-Afib, and last seen by Dr. Caryl Comes on 01/24/2020. Dose is appropriate based on dosing criteria. Will send in refill to requested pharmacy.

## 2020-06-20 ENCOUNTER — Encounter: Payer: Self-pay | Admitting: Gastroenterology

## 2020-06-20 ENCOUNTER — Ambulatory Visit (INDEPENDENT_AMBULATORY_CARE_PROVIDER_SITE_OTHER): Payer: Medicare Other | Admitting: Gastroenterology

## 2020-06-20 VITALS — BP 96/60 | HR 70 | Ht 66.0 in | Wt 217.2 lb

## 2020-06-20 DIAGNOSIS — C884 Extranodal marginal zone B-cell lymphoma of mucosa-associated lymphoid tissue [MALT-lymphoma]: Secondary | ICD-10-CM | POA: Diagnosis not present

## 2020-06-20 DIAGNOSIS — R194 Change in bowel habit: Secondary | ICD-10-CM | POA: Diagnosis not present

## 2020-06-20 DIAGNOSIS — K649 Unspecified hemorrhoids: Secondary | ICD-10-CM | POA: Diagnosis not present

## 2020-06-20 DIAGNOSIS — M79604 Pain in right leg: Secondary | ICD-10-CM | POA: Diagnosis not present

## 2020-06-20 DIAGNOSIS — I872 Venous insufficiency (chronic) (peripheral): Secondary | ICD-10-CM | POA: Diagnosis not present

## 2020-06-20 DIAGNOSIS — K5790 Diverticulosis of intestine, part unspecified, without perforation or abscess without bleeding: Secondary | ICD-10-CM

## 2020-06-20 DIAGNOSIS — L723 Sebaceous cyst: Secondary | ICD-10-CM | POA: Diagnosis not present

## 2020-06-20 DIAGNOSIS — K5900 Constipation, unspecified: Secondary | ICD-10-CM | POA: Diagnosis not present

## 2020-06-20 DIAGNOSIS — R195 Other fecal abnormalities: Secondary | ICD-10-CM | POA: Diagnosis not present

## 2020-06-20 DIAGNOSIS — M79605 Pain in left leg: Secondary | ICD-10-CM | POA: Diagnosis not present

## 2020-06-20 DIAGNOSIS — Z86010 Personal history of colonic polyps: Secondary | ICD-10-CM

## 2020-06-20 DIAGNOSIS — Z800 Family history of malignant neoplasm of digestive organs: Secondary | ICD-10-CM

## 2020-06-20 MED ORDER — SUPREP BOWEL PREP KIT 17.5-3.13-1.6 GM/177ML PO SOLN: 1.0000 | ORAL | 0 refills | Status: DC

## 2020-06-20 NOTE — Patient Instructions (Addendum)
START: Miralax (1 capful ) dissolved in at least 8 ounces of water 1-2 times daily.   If Miralax does not help, then you may try a trial of Linzess 124mcg- once daily. Samples given today.   You have been scheduled for a colonoscopy. Please follow written instructions given to you at your visit today.  Please pick up your prep supplies at the pharmacy within the next 1-3 days. If you use inhalers (even only as needed), please bring them with you on the day of your procedure.   We have sent the following medications to your pharmacy for you to pick up at your convenience: Suprep   Due to recent changes in healthcare laws, you may see the results of your imaging and laboratory studies on MyChart before your provider has had a chance to review them.  We understand that in some cases there may be results that are confusing or concerning to you. Not all laboratory results come back in the same time frame and the provider may be waiting for multiple results in order to interpret others.  Please give [place] 48 hours in order for your provider to thoroughly review all the results before contacting the office for clarification of your results.   You will be contaced by our office prior to your procedure for directions on holding your Eliquis.  If you do not hear from our office 1 week prior to your scheduled procedure, please call [phone] to discuss.  Thank you for choosing me and [facility].  Dr. Rush Landmark

## 2020-06-21 ENCOUNTER — Telehealth: Payer: Self-pay

## 2020-06-21 NOTE — Telephone Encounter (Signed)
Request for surgical clearance:     Endoscopy Procedure  What type of surgery is being performed?     Colonoscopy   When is this surgery scheduled?     07/11/2020  What type of clearance is required ?   Pharmacy  Are there any medications that need to be held prior to surgery and how long? Eliquis x 2 days prior to procedure.  Practice name and name of physician performing surgery?      [facility]-Dr.Mansouraty   What is your office phone and fax number?      Phone- [phone]  [phone]  Anesthesia type (None, local, MAC, general) ?       MAC

## 2020-06-21 NOTE — Telephone Encounter (Signed)
Patient with diagnosis of A Fib on Eliquis for anticoagulation.    Procedure: Colonoscopy Date of procedure: 07/11/20  CHADS2-VASc score of  7 (CHF, HTN, stroke/tia x 2, AGE x 2, female)  CrCl 47 mL/min using adjusted body weight Platelet count 180K  GI requested 2 day hold.  Due to elevated risk and history of stroke, will route to MD for input

## 2020-06-21 NOTE — Telephone Encounter (Signed)
° °  Primary Cardiologist: [name], MD  Chart reviewed as part of pre-operative protocol coverage. Patient was contacted 06/21/2020 in reference to pre-operative risk assessment for pending surgery as outlined below.  Kayla Harrison was last seen on 01/24/20 by Dr. Caryl Comes with history outlined of pacemaker, atrial fibrillation, pulmonary vein isolation, strokes, WCT felt atrial flutter, symptomatic orthostasis, SVC syndrome with lymphoma, obesity s/p lap band, breast CA, brucellosis, pleural effusions, dyslipidemia, hepatitis, hiatal hernia, thyroid CA, HTN, HLD.  I spoke with patient who reports she's feeling great and states "I actually feel the best I have in years" from a heart standpoint. Therefore, based on ACC/AHA guidelines, the patient would be at acceptable risk for the planned procedure without further cardiovascular testing. The patient was advised that if she develops new symptoms prior to surgery to contact our office to arrange for a follow-up visit, and she verbalized understanding.  Will route to pharm for input on anticoagulation.  Charlie Pitter, PA-C 06/21/2020, 2:31 PM

## 2020-06-22 ENCOUNTER — Encounter: Payer: Self-pay | Admitting: Gastroenterology

## 2020-06-22 DIAGNOSIS — K649 Unspecified hemorrhoids: Secondary | ICD-10-CM | POA: Insufficient documentation

## 2020-06-22 DIAGNOSIS — R194 Change in bowel habit: Secondary | ICD-10-CM | POA: Insufficient documentation

## 2020-06-22 DIAGNOSIS — R195 Other fecal abnormalities: Secondary | ICD-10-CM | POA: Insufficient documentation

## 2020-06-22 DIAGNOSIS — K5900 Constipation, unspecified: Secondary | ICD-10-CM | POA: Insufficient documentation

## 2020-06-22 DIAGNOSIS — K5790 Diverticulosis of intestine, part unspecified, without perforation or abscess without bleeding: Secondary | ICD-10-CM | POA: Insufficient documentation

## 2020-06-22 NOTE — Progress Notes (Signed)
[place] VISIT   Primary Care Provider [name], [place] [address] [place] [address] [phone]  Referring Provider [name], MD [address] [place],   [address] [phone]  Patient Profile: Kayla Harrison is a 74 y.o. female with a pmh significant for atrial fibrillation (on anticoagulation), status post AICD (secondary to CHB), prior breast cancer, prior thyroid cancer, prior non-Hodgkin's lymphoma, status post CVA, nephrolithiasis, hypertension, hyperlipidemia, hypothyroidism, OSA, status post lap band, status post appendectomy, status post cholecystectomy, adenomatous colon polyps, family history colon cancer (father in 87s), diverticulosis.  The patient presents to the [facility] for an evaluation and management of problem(s) noted below:  Problem List 1. Change in bowel habits   2. Constipation, unspecified constipation type   3. Dark stools   4. Hemorrhoids, unspecified hemorrhoid type   5. Diverticulosis   6. Personal history of colonic polyps   7. Family history of colon cancer     History of Present Illness This is the patient's emergent visit to the outpatient [place] clinic.  The patient states that for years she has had abnormal sense of bowel movements.  She used to have daily bowel movements that were well formed.  Over the course of the last year to year and a half her bowels become more constipated.  She will go every 4 to 5 days.  She does require laxatives.  She has tried MiraLAX once daily for a few days without significant benefit.  She will use Senokot and that will help things move if she has not gone after 3 to 4 days.  Has never been on persistent use of laxatives for long periods of time.  She is never used Linzess Amitiza or Trulance.  She has not noticed any blood in her stools.  She does have a history of what she believes to be hemorrhoids and this is documented in prior  colonoscopy as well.  Patient states she has noticed at times darkness to her stools which occurs a few times every month and it looks normal subjective black.  She describes issues of what she believes to be acid reflux but actually is just more so belching that occurs at times.  She is has a history of laparoscopic band gastroplasty years ago.  She maintains a keto diet in an effort of trying to lose weight and thus any weight loss is intentional.  She has early family history of colon cancer for which she has been receiving colonoscopies every 5 years but also has a personal history of colon polyps back in 2000 and she remains on anticoagulation as a result of her atrial fibrillation.  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  The patient does not take significant nonsteroidals or BC/Goody powders.  GI Review of Systems Positive as above including infrequent bloating Negative for dysphagia, odynophagia, nocturnal cough, nausea, vomiting, hematochezia  Review of Systems General: Denies fevers/chills HEENT: Denies oral lesions Cardiovascular: Denies chest pain/current palpitations Pulmonary: Denies shortness of breath Gastroenterological: See HPI Genitourinary: Denies darkened urine Hematological: Positive for easy bruising/bleeding due to anticoagulation Endocrine: Denies temperature intolerance Dermatological: Denies jaundice Psychological: Mood is stable   Medications Current Outpatient Medications  Medication Sig Dispense Refill   acetaminophen (TYLENOL) 500 MG tablet Take 500-1,000 mg by mouth every 6 (six) hours as needed for mild pain or headache.     amiodarone (PACERONE) 200 MG tablet Take 1 tablet (200 mg total) by mouth  daily. 90 tablet 3   calcium-vitamin D (OSCAL WITH D) 500-200 MG-UNIT tablet Take 2 tablets by mouth daily with breakfast. 180 tablet 1   ELIQUIS 5 MG TABS tablet TAKE 1 TABLET BY MOUTH  TWICE DAILY 180 tablet 2   furosemide  (LASIX) 20 MG tablet Take 20 mg by mouth daily as needed for fluid or edema.      levothyroxine (SYNTHROID) 137 MCG tablet Take 137 mcg by mouth See admin instructions. TAKE 1 TABLET (137 MCG) BY MOUTH DAILY, EXCEPT SATURDAYS     Lifitegrast (XIIDRA) 5 % SOLN Place 1 drop into both eyes at bedtime.      Multiple Vitamins-Minerals (ONE-A-DAY WOMENS 50+ ADVANTAGE) TABS Take 1 tablet by mouth daily with breakfast.     rosuvastatin (CRESTOR) 10 MG tablet Take 10 mg by mouth 2 (two) times a week. Takes on Monday and Thursday     senna (SENOKOT) 8.6 MG tablet Take 1 tablet by mouth as needed for constipation.     Na Sulfate-K Sulfate-Mg Sulf (SUPREP BOWEL PREP KIT) 17.5-3.13-1.6 GM/177ML SOLN Take 1 kit by mouth as directed. For colonoscopy prep 354 mL 0   Current Facility-Administered Medications  Medication Dose Route Frequency Provider Last Rate Last Admin   sodium chloride flush (NS) 0.9 % injection 3 mL  3 mL Intravenous Q12H Deboraha Sprang, MD        Allergies Allergies  Allergen Reactions   Dofetilide Other (See Comments)    Prolonged QT interval    Ranolazine     Balance issues   Rivaroxaban Other (See Comments)    Nose Bleed X 6 hrs ; packing in ER Nasal hemorrhage   Rivaroxaban     Nose Bleed X 6 hrs ; packing in ER Nasal hemorrhage   Tape Other (See Comments)    SKIN IS THIN AND TEARS EASILY    Tikosyn [Dofetilide] Other (See Comments)    "Long QT wave"    Epinephrine Other (See Comments)    Oral anesthetic Dental form only (liquid). Patient stated she will become "out of it" she can hear you but cannot respond in normal fashion. "not fully with it"   Epinephrine    Ketamine Other (See Comments)    Hallucinations    Adhesive [Tape] Rash    Paper tape as well   Ketamine Other (See Comments)    HALLUCINATIONS    Histories Past Medical History:  Diagnosis Date   Anemia    Arthritis    osteoarthritis - knees and right shoulder   Atrial  fibrillation ([place])    ablation- 2x's-- 1st time- [facility], 2nd event at [facility] in 2008. Convergent ablation at [facility] 6/14   Atrial fibrillation ([place])    Blood transfusion without reported diagnosis    Breast cancer ([place])    Dr Margot Chimes, total thyroidectomy- 1999- for cancer   Brucellosis 1964   Chronic bilateral pleural effusions    Colon polyp    Dr Earlean Shawl   Complete heart block ([place])    Complication of anesthesia    Ketamine produces LSD reaction, bright colored nightmarish experience    Dyslipidemia    Dyspnea    Endometriosis    Fibroids    H/O pleural effusion    s/p thoracentesis w 3276m withdrawn   Hepatitis    [place] ?hepatitis    History of dysphagia    due to radiation therapy   History of hiatal hernia  small noted on PET scan   History of kidney stones    Hx of thyroid cancer    Dr Forde Dandy   Hyperlipidemia    Hypertension    Hypothyroidism    Lung cancer, lower lobe ([place]) 01/2017   radiation RX completed 03/04/17; will start chemo 6/27, pt unaware of lung cancer   Morbid obesity ([place])    Status post lap band surgery   Nephrolithiasis    Non Hodgkin's lymphoma ([place])    on chemotherapy   Persistent atrial fibrillation ([place])    a. s/p PVI 2008 b. s/p convergent ablation 5364 complicated by bradycardia requiring pacemaker implant   Personal history of radiation therapy    Presence of permanent cardiac pacemaker    Rotator cuff tear    Right   Sinus node dysfunction ([place])    Complicating convergent ablation 6/14   Stroke ([place])    2003- [place] x2   SVC syndrome    with lung mass and non hodgkins lymphoma   Thyroid cancer ([place]) 2000   Past Surgical History:  Procedure Laterality Date   ABDOMINAL HYSTERECTOMY  1983   afib ablation     a. 2008 PVI b. 2014 convergent ablation   APPENDECTOMY     BONE MARROW BIOPSY  02/21/2017   BREAST LUMPECTOMY Left 2010   [place]     2015- negative   CARDIOVERSION  10/09/2012   Procedure: CARDIOVERSION;  Surgeon: Minus Breeding, MD;  Location: [place];  Service: Cardiovascular;  Laterality: N/A;   CARDIOVERSION  10/09/2012   Procedure: CARDIOVERSION;  Surgeon: Minus Breeding, MD;  Location: [facility] ENDOSCOPY;  Service: Cardiovascular;  Laterality: N/A;  [name] gave the ok to add pt to the add on , but we must check to find out if the can add pt on at [phone])   CARDIOVERSION N/A 11/20/2012   Procedure: CARDIOVERSION;  Surgeon: Fay Records, MD;  Location: [place];  Service: Cardiovascular;  Laterality: N/A;   CARDIOVERSION N/A 07/18/2017   Procedure: CARDIOVERSION;  Surgeon: Thayer Headings, MD;  Location: [place];  Service: Cardiovascular;  Laterality: N/A;   CARDIOVERSION N/A 10/03/2017   Procedure: CARDIOVERSION;  Surgeon: Sanda Klein, MD;  Location: [place];  Service: Cardiovascular;  Laterality: N/A;   CARDIOVERSION N/A 01/07/2018   Procedure: CARDIOVERSION;  Surgeon: Nahser, Wonda Cheng, MD;  Location: [facility] ENDOSCOPY;  Service: Cardiovascular;  Laterality: N/A;   CARDIOVERSION N/A 12/10/2019   Procedure: CARDIOVERSION;  Surgeon: Buford Dresser, MD;  Location: [place];  Service: Cardiovascular;  Laterality: N/A;   CHOLECYSTECTOMY     COLONOSCOPY W/ POLYPECTOMY     Dr Earlean Shawl   CYSTOSCOPY N/A 02/06/2015   Procedure: CYSTOSCOPY;  Surgeon: Kathie Rhodes, MD;  Location: [facility];  Service: Urology;  Laterality: N/A;   CYSTOSCOPY W/ RETROGRADES Left 11/17/2017   Procedure: CYSTOSCOPY WITH RETROGRADE /PYELOGRAM/;  Surgeon: Kathie Rhodes, MD;  Location: [facility];  Service: Urology;  Laterality: Left;   CYSTOSCOPY WITH RETROGRADE PYELOGRAM, URETEROSCOPY AND STENT PLACEMENT Right 02/06/2015   Procedure: RETROGRADE PYELOGRAM, RIGHT URETEROSCOPY STENT PLACEMENT;  Surgeon: Kathie Rhodes, MD;  Location: [facility];  Service: Urology;  Laterality: Right;   CYSTOSCOPY WITH RETROGRADE  PYELOGRAM, URETEROSCOPY AND STENT PLACEMENT Right 03/07/2017   Procedure: CYSTOSCOPY WITH RIGHT RETROGRADE PYELOGRAM,RIGHT  URETEROSCOPYLASER LITHOTRIPSY  AND STENT PLACEMENT AND STONE BASKETRY;  Surgeon: Kathie Rhodes, MD;  Location: [place];  Service: Urology;  Laterality: Right;   EYE SURGERY  cataract surgery   fatty mass removal  1999   pubic area   HOLMIUM LASER APPLICATION N/A 6/78/9381   Procedure: HOLMIUM LASER APPLICATION;  Surgeon: Kathie Rhodes, MD;  [facility];  Service: Urology;  Laterality: N/A;   HOLMIUM LASER APPLICATION Right 0/17/5102   Procedure: HOLMIUM LASER APPLICATION;  Surgeon: Kathie Rhodes, MD;  Location: [facility];  Service: Urology;  Laterality: Right;   HOLMIUM LASER APPLICATION Left 5/85/2778   Procedure: HOLMIUM LASER APPLICATION;  Surgeon: Kathie Rhodes, MD;  [facility];  Service: Urology;  Laterality: Left;   IR FLUORO GUIDE PORT INSERTION RIGHT  02/24/2017   IR NEPHROSTOMY PLACEMENT RIGHT  11/17/2017   IR PATIENT EVAL TECH 0-60 MINS  03/11/2017   IR REMOVAL TUN ACCESS W/ PORT W/O FL MOD SED  04/20/2018   IR US GUIDE VASC ACCESS RIGHT  02/24/2017   KNEE ARTHROSCOPY     bilateral   LAPAROSCOPIC GASTRIC BANDING  07/10/2010   LAPAROSCOPIC GASTRIC BANDING     Laparoscopic adjustable banding APS System with posterior hiatal hernia, 2 suture.   LAPAROTOMY     for ruptured ovary and ovarian artery    NEPHROLITHOTOMY Right 11/17/2017   Procedure: NEPHROLITHOTOMY PERCUTANEOUS;  Surgeon: Kathie Rhodes, MD;  [facility];  Service: Urology;  Laterality: Right;   PACEMAKER INSERTION  03/10/2013   MDT dual chamber PPM   POCKET REVISION N/A 12/08/2013   Procedure: POCKET REVISION;  Surgeon: Deboraha Sprang, MD;  Location: [facility] CATH LAB;  Service: Cardiovascular;  Laterality: N/A;   PORTA CATH INSERTION     REVERSE SHOULDER ARTHROPLASTY Right 05/14/2018   Procedure: RIGHT REVERSE SHOULDER ARTHROPLASTY;   Surgeon: Tania Ade, MD;  Location: [place];  Service: Orthopedics;  Laterality: Right;   REVERSE SHOULDER REPLACEMENT Right 05/14/2018   RIGHT HEART CATH N/A 07/21/2019   Procedure: RIGHT HEART CATH;  Surgeon: Larey Dresser, MD;  Location: [place] CV LAB;  Service: Cardiovascular;  Laterality: N/A;   TEE WITH CARDIOVERSION  09/22/2017   TEE WITHOUT CARDIOVERSION N/A 10/03/2017   Procedure: TRANSESOPHAGEAL ECHOCARDIOGRAM (TEE);  Surgeon: Sanda Klein, MD;  Location: [facility] ENDOSCOPY;  Service: Cardiovascular;  Laterality: N/A;   TEE WITHOUT CARDIOVERSION N/A 08/04/2019   Procedure: TRANSESOPHAGEAL ECHOCARDIOGRAM (TEE);  Surgeon: Larey Dresser, MD;  Location: [facility] ENDOSCOPY;  Service: Cardiovascular;  Laterality: N/A;   TEE WITHOUT CARDIOVERSION N/A 12/10/2019   Procedure: TRANSESOPHAGEAL ECHOCARDIOGRAM (TEE);  Surgeon: Buford Dresser, MD;  Location: [place];  Service: Cardiovascular;  Laterality: N/A;   THYROIDECTOMY  1998   Dr Margot Chimes   TONSILLECTOMY     TOTAL KNEE ARTHROPLASTY  04/13/2012   Procedure: TOTAL KNEE ARTHROPLASTY;  Surgeon: Rudean Haskell, MD;  Location: [place];  Service: Orthopedics;  Laterality: Right;   VIDEO BRONCHOSCOPY WITH ENDOBRONCHIAL ULTRASOUND N/A 02/07/2017   Procedure: VIDEO BRONCHOSCOPY WITH ENDOBRONCHIAL ULTRASOUND;  Surgeon: Marshell Garfinkel, MD;  Location: [place];  Service: Pulmonary;  Laterality: N/A;   Social History   Socioeconomic History   Marital status: Single    Spouse name: Not on file   Number of children: 0   Years of education: Not on file   Highest education level: Master's degree (e.g., MA, MS, MEng, MEd, MSW, MBA)  Occupational History   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[employer]    Employer: RETIRED  Tobacco Use   Smoking status: Never Smoker   Smokeless tobacco: Never Used  Vaping Use   Vaping Use: Never used  Substance and  Sexual Activity   Alcohol use: No    Comment: none since 1990     Drug use: Never   Sexual activity: Never    Birth control/protection: Surgical  Other Topics Concern   Not on file  Social History Narrative   ** Merged History Encounter **       SINGLE  NO CHILDREN NEVER SMOKED EXERCISE 3 X WK RETIRED NO CAFFEINE      Social Determinants of Health   Financial Resource Strain:    Difficulty of Paying Living Expenses: Not on file  Food Insecurity:    Worried About Charity fundraiser in the Last Year: Not on file   [employer] of Food in the Last Year: Not on file  Transportation Needs:    Lack of Transportation (Medical): Not on file   Lack of Transportation (Non-Medical): Not on file  Physical Activity:    Days of Exercise per Week: Not on file   Minutes of Exercise per Session: Not on file  Stress:    Feeling of Stress : Not on file  Social Connections:    Frequency of Communication with Friends and Family: Not on file   Frequency of Social Gatherings with Friends and Family: Not on file   Attends Religious Services: Not on file   Active Member of Clubs or Organizations: Not on file   Attends Archivist Meetings: Not on file   Marital Status: Not on file  Intimate Partner Violence:    Fear of Current or Ex-Partner: Not on file   Emotionally Abused: Not on file   Physically Abused: Not on file   Sexually Abused: Not on file   Family History  Problem Relation Age of Onset   Heart disease Father 41       MI _0    Colon cancer Father        COLON   Heart attack Father    Other Mother        temporal arteritis    Diabetes Sister    Diabetes Brother    Diabetes Paternal Aunt    Diabetes Paternal Grandmother    Esophageal cancer Neg Hx    Inflammatory bowel disease Neg Hx    Liver disease Neg Hx    Pancreatic cancer Neg Hx    Stomach cancer Neg Hx    I have reviewed her medical, social, and family history in detail and updated the electronic medical record as necessary.     PHYSICAL EXAMINATION  BP 96/60 (BP Location: Left Arm, Patient Position: Sitting, Cuff Size: Normal)    Pulse 70    Ht _1  (1.676 m)    Wt 217 lb 4 oz (98.5 kg)    SpO2 98%    BMI 35.07 kg/m  Wt Readings from Last 3 Encounters:  06/20/20 217 lb 4 oz (98.5 kg)  01/24/20 220 lb (99.8 kg)  12/13/19 229 lb (103.9 kg)   GEN: NAD, appears stated age, doesn't appear chronically ill PSYCH: Cooperative, without pressured speech EYE: Conjunctivae pink, sclerae anicteric ENT: MMM, without oral ulcers, no erythema or exudates noted NECK: Supple CV: RR without R/Gs  RESP: CTAB posteriorly, without wheezing GI: NABS, soft, NT/ND, without rebound or guarding, no HSM appreciated GU: DRE shows MSK/EXT: _ edema, no palmar erythema SKIN: No jaundice, no spider angiomata, no concerning rashes NEURO:  Alert & Oriented x 3, no focal deficits, no evidence of asterixis   REVIEW OF DATA  I reviewed the following data  at the time of this encounter:  GI Procedures and Studies  2016 colonoscopy for history of adenomatous colon polyps  Moderate diverticulosis was noted in the sigmoid colon descending colon Internal hemorrhoids Examination was otherwise normal  Laboratory Studies  Reviewed those in epic  Outside records to be scanned into the chart Hemoglobin 13.8 Hematocrit 42.4 WBC 6.6 Platelets 186 MCV 95.2 Sodium 140 Potassium 4.9 BUN/creatinine 27/1.2 TSH 1.32 Free T4 1.6 AST/ALT 28/29 Alk phos of 85 Total bili 0.7   Imaging Studies  No relevant studies to review   ASSESSMENT  Ms. Eber is a 74 y.o. female with a pmh significant for atrial fibrillation (on anticoagulation), status post AICD (secondary to CHB), prior breast cancer, prior thyroid cancer, prior non-Hodgkin's lymphoma, status post CVA, nephrolithiasis, hypertension, hyperlipidemia, hypothyroidism, OSA, status post lap band, status post appendectomy, status post cholecystectomy, adenomatous colon polyps, family  history colon cancer (father in 74s), diverticulosis.  The patient is seen today for evaluation and management of:  1. Change in bowel habits   2. Constipation, unspecified constipation type   3. Dark stools   4. Hemorrhoids, unspecified hemorrhoid type   5. Diverticulosis   6. Personal history of colonic polyps   7. Family history of colon cancer    The patient is hemodynamically stable.  Clinically, she has had a significant change in her bowel habits over the course of the last year to year and a half.  Thyroid function seems to be intact.  Metabolically I cannot see a reason why she has had these changes.  Thankfully she is not having significant red flag symptoms otherwise regards to weight loss as she has had intentional weight loss.  No blood in her stool although she describes a blackish tinge to them occurring at times.  Her hemoglobin is normal without any evidence of anemia or microcytosis.  I think diagnostic endoscopies are indicated.  I offered the patient both a lower endoscopy as well as an upper endoscopy but she is deferred on the upper endoscopy.  Is not clear that the patient has true acid reflux but the belching symptoms could be a result of her laparoscopic banding but again she wants to hold on that currently.  We will initiate the patient on scheduled laxative therapy and give her samples for Linzess that may be effective for her.  We will schedule out her procedures a few weeks from now so we can try and have her bowel movements moving more so rather than hopefully needing to do a 2-day preparation but we will need to be prepared for that possibly.  We will get approval from cardiology to come off anticoagulation appropriately prior to her procedure.  The risks and benefits of endoscopic evaluation were discussed with the patient; these include but are not limited to the risk of perforation, infection, bleeding, missed lesions, lack of diagnosis, severe illness requiring  hospitalization, as well as anesthesia and sedation related illnesses.  The patient is agreeable to proceed.  All patient questions were answered to the best of my ability, and the patient agrees to the aforementioned plan of action with follow-up as indicated.   PLAN  Proceed with scheduling colonoscopy -1 day prep currently though 2-day prep may be necessary -We will hold Eliquis after obtaining cardiology approval for  Start taking MiraLAX once to twice daily on a regular basis Increase your fluid consumption to 64 to 80 ounces of fluid per day MiraLAX does not help after course of a couple weeks  then trial Linzess 145 mcg once daily with samples that were given today Diagnostic endoscopy recommended although patient deferred on this currently   Orders Placed This Encounter  Procedures   Ambulatory referral to Gastroenterology    New Prescriptions   NA SULFATE-K SULFATE-MG SULF (SUPREP BOWEL PREP KIT) 17.5-3.13-1.6 GM/177ML SOLN    Take 1 kit by mouth as directed. For colonoscopy prep   Modified Medications   No medications on file    Planned Follow Up No follow-ups on file.   Total Time in Face-to-Face and in Coordination of Care for patient including independent/personal interpretation/review of prior testing, medical history, examination, medication adjustment, communicating results with the patient directly, and documentation with the EHR is 45 minutes.   Justice Britain, MD [facility] Advanced [facility]oscopy Office # [phone]

## 2020-06-23 NOTE — Telephone Encounter (Signed)
Kayla Harrison we should find out what the indication is   if its not absolutely indicateed would suggest canceling  I coulndt find out who the GI doc ws  Thanks SK

## 2020-06-26 IMAGING — MG DIGITAL SCREENING BILATERAL MAMMOGRAM WITH TOMO AND CAD
8 series · 8 of 24 positions shown · non-contrast
Comparison: Previous exam(s).

CLINICAL DATA: Screening.

EXAM:
DIGITAL SCREENING BILATERAL MAMMOGRAM WITH TOMO AND CAD

[R CC synth-2D]
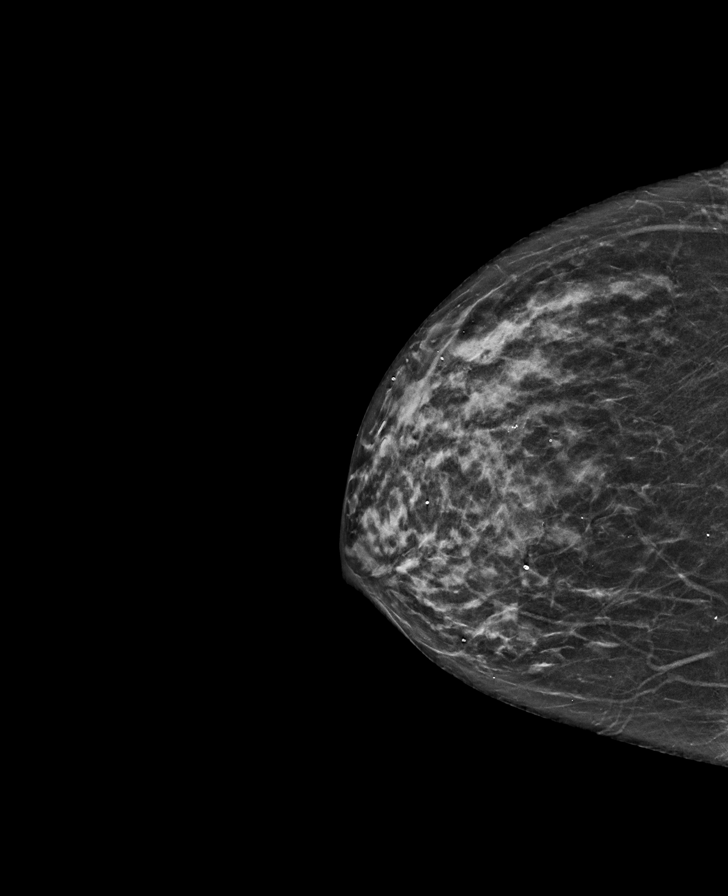

[L CC synth-2D]
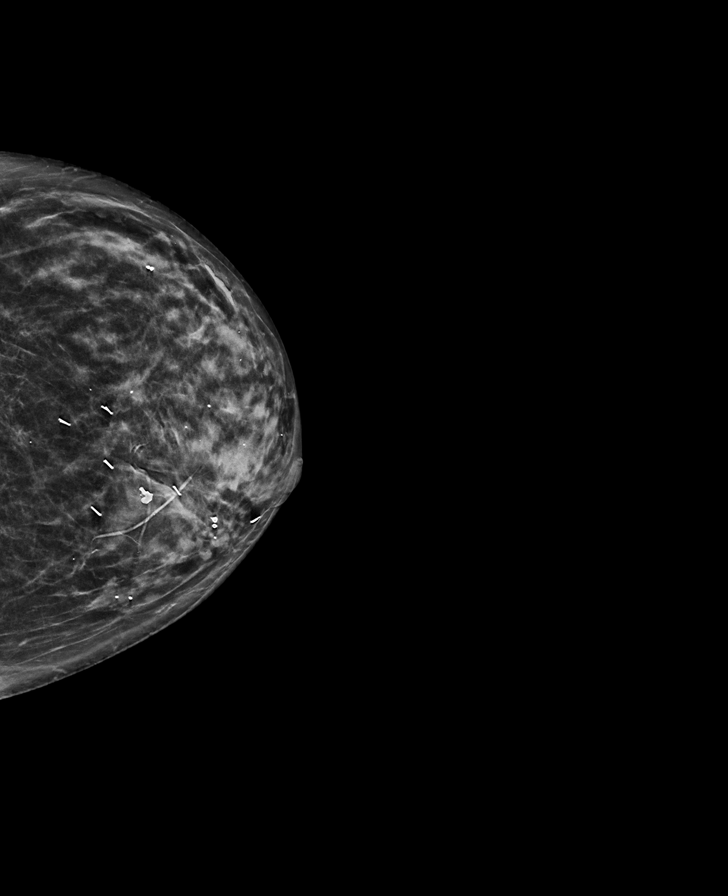

[R MLO synth-2D]
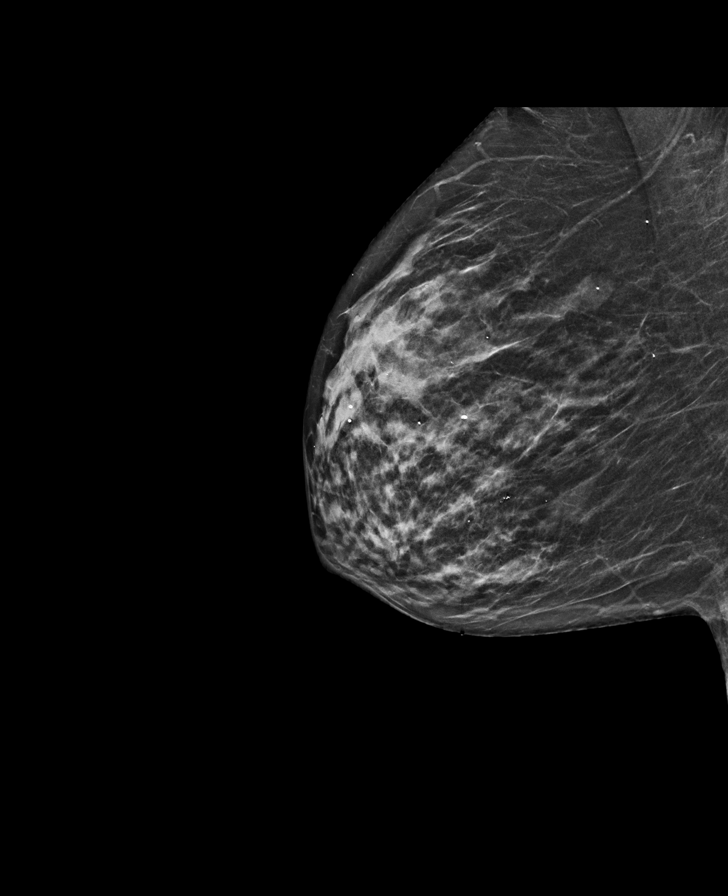

[L MLO synth-2D]
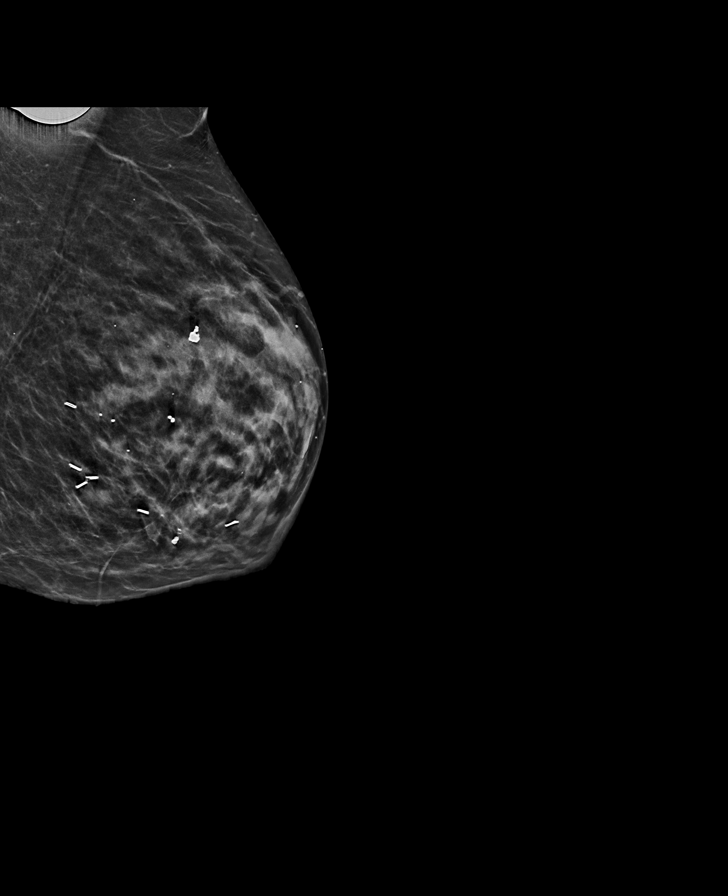

[R MLO tomo · tomo slice 27/52.0]
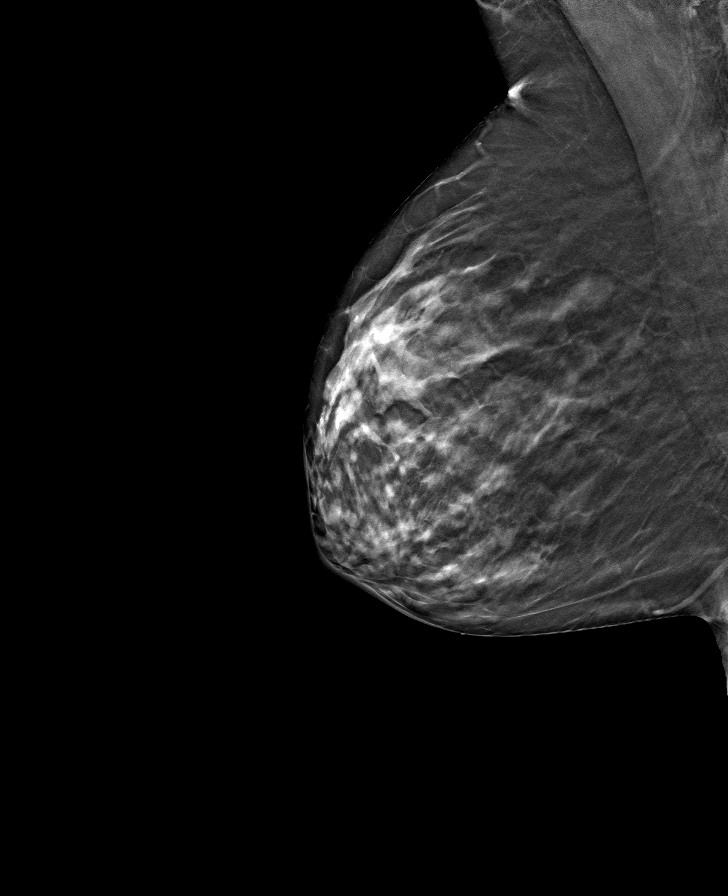

[R CC tomo · tomo slice 25/50.0]
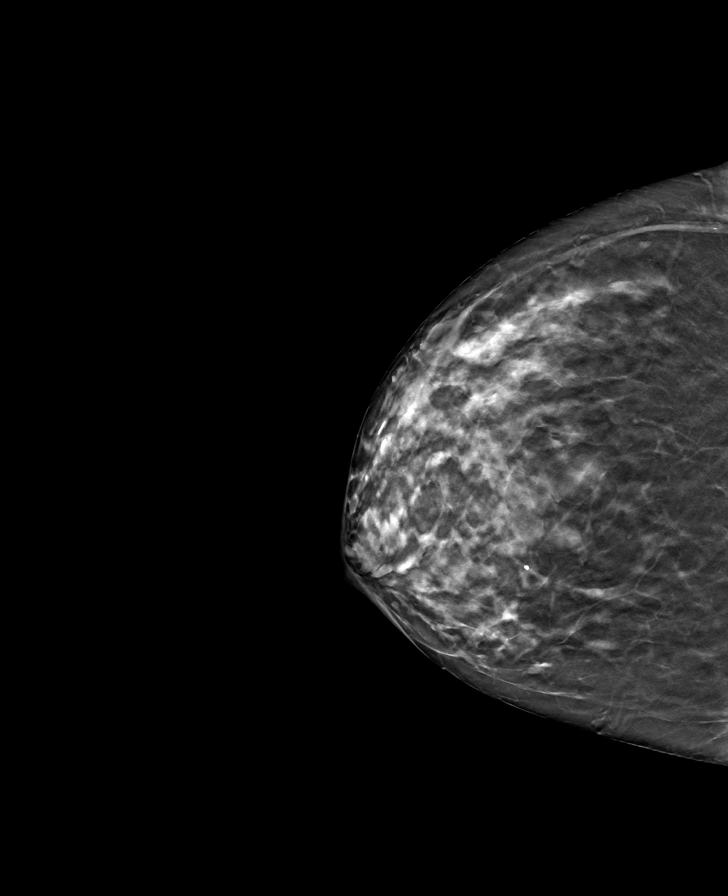

[L CC tomo · tomo slice 27/54.0]
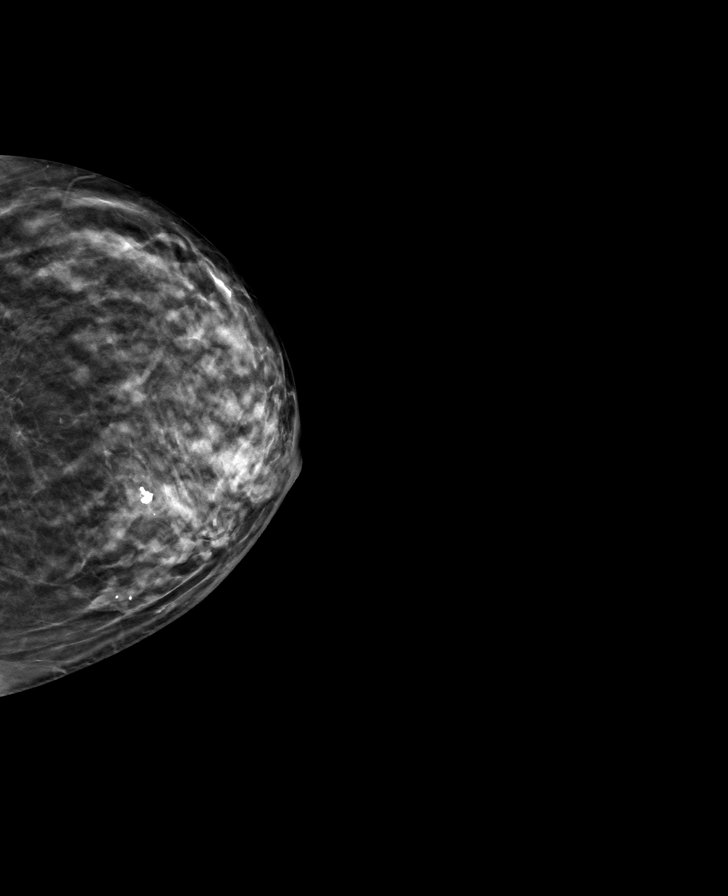

[L MLO tomo · tomo slice 27/53.0]
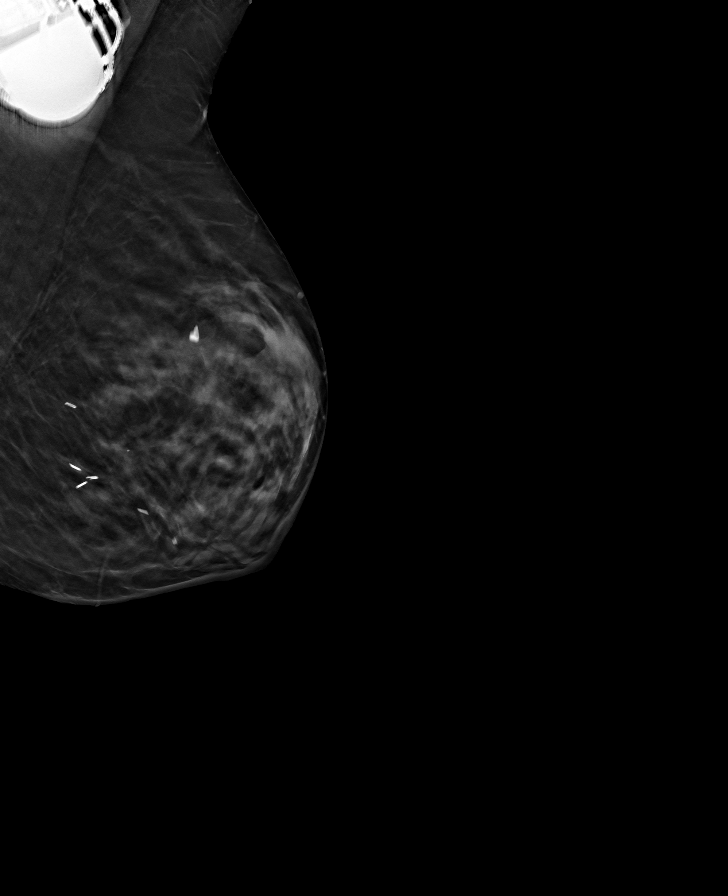

[8 of 24 positions shown; findings below may reference images not displayed]

ACR Breast Density Category c: The breast tissue is heterogeneously
dense, which may obscure small masses.
FINDINGS: There are no findings suspicious for malignancy. Images were
processed with CAD.
IMPRESSION: No mammographic evidence of malignancy. A result letter of this
screening mammogram will be mailed directly to the patient.

RECOMMENDATION:
Screening mammogram in one year. (Code:[id number]-U-LHB)

BI-RADS CATEGORY  1: Negative.

## 2020-06-26 NOTE — Telephone Encounter (Signed)
[name], If GI was willing to do with a 1 day hold, would you be ok with this?

## 2020-06-26 NOTE — Telephone Encounter (Signed)
Gm  Thanks  Started a secure chat-- get everybody on the line Thanks SK

## 2020-06-27 ENCOUNTER — Telehealth: Payer: Self-pay | Admitting: Gastroenterology

## 2020-06-27 DIAGNOSIS — R194 Change in bowel habit: Secondary | ICD-10-CM

## 2020-06-27 DIAGNOSIS — K5900 Constipation, unspecified: Secondary | ICD-10-CM

## 2020-06-27 DIAGNOSIS — Z800 Family history of malignant neoplasm of digestive organs: Secondary | ICD-10-CM

## 2020-06-27 DIAGNOSIS — Z860100 Personal history of colon polyps, unspecified: Secondary | ICD-10-CM

## 2020-06-27 DIAGNOSIS — R195 Other fecal abnormalities: Secondary | ICD-10-CM

## 2020-06-27 NOTE — Telephone Encounter (Signed)
After extensive discussion with the patient this afternoon, as well as discussions occurring between cardiology and internal medicine in pharmacy, we have decided that the patient's risk of coming off anticoagulation is significant to the point that we should try to minimize any disruption in her anticoagulation.  As such, in an effort of trying to further evaluate her symptoms and only come off anticoagulation if absolutely necessary, we are going to move forward with the following plan: 1) cancel screening colonoscopy 2) order CT colonography 3) set up a follow-up in clinic with myself or one of our APP's to do an in office anoscopy to evaluate anorectum (this was not performed at last clinic visit since we were planning a colonoscopy) 4) no anticoagulation hold 5) only pursue colonoscopy if findings on CT colonography or anorectal endoscopy are concerning (at which point anticoagulation will need to be placed on hold) 6) based on findings on anorectum will consider the role of internal hemorrhoidal banding versus need for colorectal surgery referral and hemorrhoidectomy  Patient appreciative for the care she has received with [facility] as well as the multidisciplinary discussion with her team. We will move forward with the plan as outlined.  Kayla Britain, MD [facility] Advanced [facility]oscopy Office # [phone]

## 2020-06-27 NOTE — Telephone Encounter (Signed)
Left a message with [facility] requesting details regarding indication for colonoscopy. They will call back.

## 2020-06-29 NOTE — Telephone Encounter (Signed)
Dr.Mansouraty-  Colonoscopy cancelled for 07/11/2020. CT colonography scheduled for 07/18/20 [facility] 9:00am. Pt to pick up 4 day prep prior to CT. Appointment made with APP- Ellouise Newer 11/2@ 1:30pm. Pt would like to make sure that this will all be covered by  Insurance before proceeding. We will leave everything scheduled as is, but will make changes if needed once approval or denial has been received from insurance. Message sent to [facility] regarding pre-cert for insurance.

## 2020-06-29 NOTE — Telephone Encounter (Signed)
Thank you Kayla Harrison. Please make sure they are aware we are doing this for Colon Cancer Screening so it should end up being covered and high risk of anticoagulation coming off. Her changes in bowel habits are incidental, but certainly we need to screen and ensure she hasn't developed a mass/lesion. Thanks. GM

## 2020-06-29 NOTE — Addendum Note (Signed)
Addended by: Debbe Mounts on: 06/29/2020 11:01 AM   Modules accepted: Orders

## 2020-07-03 DIAGNOSIS — Z23 Encounter for immunization: Secondary | ICD-10-CM | POA: Diagnosis not present

## 2020-07-05 DIAGNOSIS — D229 Melanocytic nevi, unspecified: Secondary | ICD-10-CM | POA: Diagnosis not present

## 2020-07-05 DIAGNOSIS — L309 Dermatitis, unspecified: Secondary | ICD-10-CM | POA: Diagnosis not present

## 2020-07-05 DIAGNOSIS — L814 Other melanin hyperpigmentation: Secondary | ICD-10-CM | POA: Diagnosis not present

## 2020-07-05 DIAGNOSIS — Z85828 Personal history of other malignant neoplasm of skin: Secondary | ICD-10-CM | POA: Diagnosis not present

## 2020-07-05 DIAGNOSIS — L720 Epidermal cyst: Secondary | ICD-10-CM | POA: Diagnosis not present

## 2020-07-05 DIAGNOSIS — L905 Scar conditions and fibrosis of skin: Secondary | ICD-10-CM | POA: Diagnosis not present

## 2020-07-10 ENCOUNTER — Telehealth: Payer: Self-pay

## 2020-07-10 NOTE — Telephone Encounter (Signed)
Carelink alert received for presenting ?junctional vs SR with long 1st degree clock. Patient reports she has felt ok and not had any issues recently. Patient requested to send manual transmission. If no changes we will not call her back. Verbalized understanding.  Manual transmission received 07/10/20. Presenting AP/ VS 70. Patient has hx of same EGM.

## 2020-07-11 ENCOUNTER — Encounter: Payer: Medicare Other | Admitting: Gastroenterology

## 2020-07-14 ENCOUNTER — Telehealth: Payer: Self-pay | Admitting: Gastroenterology

## 2020-07-14 NOTE — Telephone Encounter (Signed)
Patient is calling in reference to her prep for CT scan appointment

## 2020-07-14 NOTE — Telephone Encounter (Signed)
Returned pt call. Informed pt that she has to go to [facility] [address] Kristeen Mans [address] to pick up prep and instructions on how to take prep will be given at that time. Pt voiced understanding.

## 2020-07-17 ENCOUNTER — Other Ambulatory Visit: Payer: Self-pay | Admitting: *Deleted

## 2020-07-17 DIAGNOSIS — M7989 Other specified soft tissue disorders: Secondary | ICD-10-CM

## 2020-07-18 ENCOUNTER — Ambulatory Visit
Admission: RE | Admit: 2020-07-18 | Discharge: 2020-07-18 | Disposition: A | Discharge status: Home / Self Care | Payer: Medicare Other | Source: Ambulatory Visit | Attending: Gastroenterology | Admitting: Gastroenterology

## 2020-07-18 DIAGNOSIS — R194 Change in bowel habit: Secondary | ICD-10-CM

## 2020-07-18 DIAGNOSIS — R195 Other fecal abnormalities: Secondary | ICD-10-CM

## 2020-07-18 DIAGNOSIS — K5730 Diverticulosis of large intestine without perforation or abscess without bleeding: Secondary | ICD-10-CM | POA: Diagnosis not present

## 2020-07-18 DIAGNOSIS — Z86010 Personal history of colonic polyps: Secondary | ICD-10-CM

## 2020-07-18 DIAGNOSIS — K5900 Constipation, unspecified: Secondary | ICD-10-CM

## 2020-07-18 DIAGNOSIS — Z800 Family history of malignant neoplasm of digestive organs: Secondary | ICD-10-CM

## 2020-07-19 ENCOUNTER — Other Ambulatory Visit: Payer: Self-pay | Admitting: Endocrinology

## 2020-07-19 ENCOUNTER — Ambulatory Visit
Admission: RE | Admit: 2020-07-19 | Discharge: 2020-07-19 | Disposition: A | Discharge status: Home / Self Care | Payer: Medicare Other | Source: Ambulatory Visit | Attending: Endocrinology | Admitting: Endocrinology

## 2020-07-19 ENCOUNTER — Other Ambulatory Visit: Payer: Self-pay

## 2020-07-19 DIAGNOSIS — R921 Mammographic calcification found on diagnostic imaging of breast: Secondary | ICD-10-CM

## 2020-07-19 DIAGNOSIS — R920 Mammographic microcalcification found on diagnostic imaging of breast: Secondary | ICD-10-CM | POA: Diagnosis not present

## 2020-07-19 IMAGING — DX PORTABLE CHEST - 1 VIEW
1 series · 1 of 1 positions shown · non-contrast
Comparison: Chest radiograph February 24, 2018 and chest CT August 10, 2018

CLINICAL DATA: Shortness of breath and dizziness

EXAM:
PORTABLE CHEST 1 VIEW

[chest]
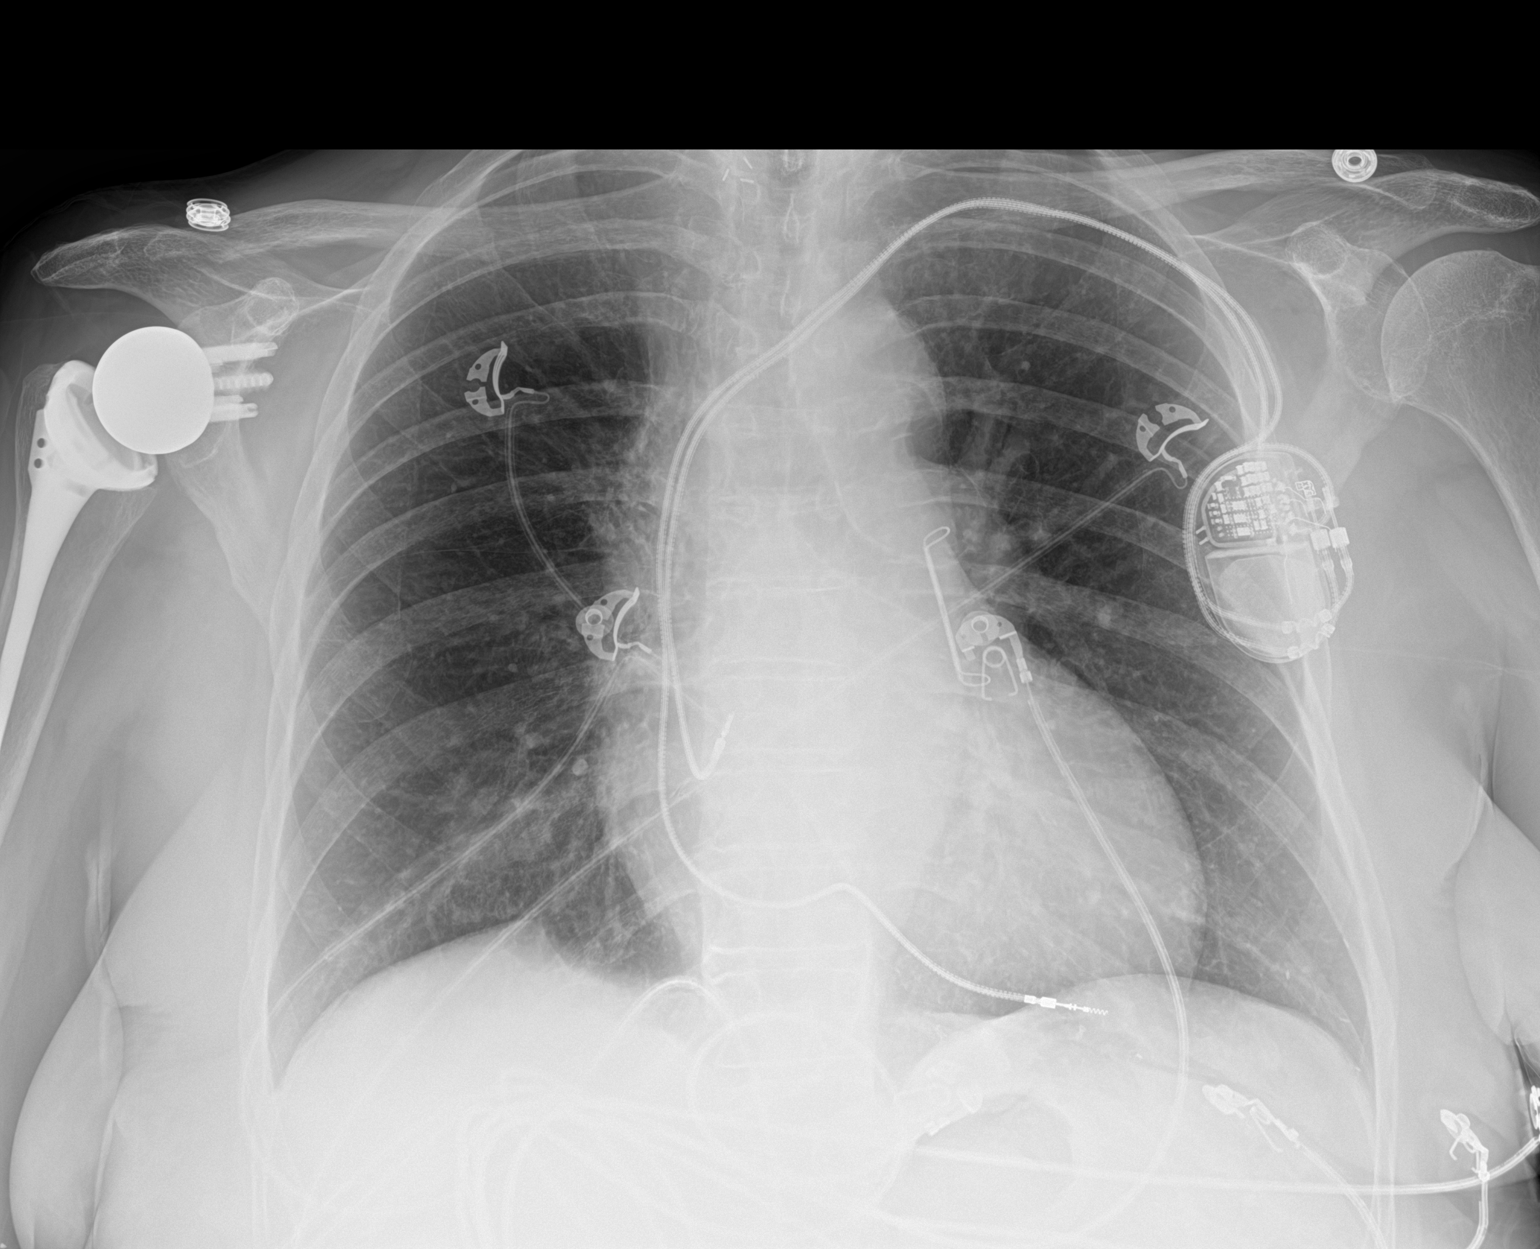

[1 of 1 positions shown; findings below may reference images not displayed]

FINDINGS: There is scarring in the right perihilar region. There is no edema
or consolidation. Heart is upper normal in size with pulmonary
vascularity normal. There is a left atrial appendage clamp present.
Pacemaker leads are attached to the right atrium and right
ventricle. Port-A-Cath is no longer evident. There is now a total
shoulder replacement on the right. Surgical clips are noted in the
region of the thyroid.
IMPRESSION: Essentially stable right perihilar region scarring. No appreciable
edema or consolidation. Stable cardiac silhouette. Areas of
postoperative change noted.

## 2020-07-25 ENCOUNTER — Encounter: Payer: Self-pay | Admitting: Physician Assistant

## 2020-07-25 ENCOUNTER — Ambulatory Visit (INDEPENDENT_AMBULATORY_CARE_PROVIDER_SITE_OTHER): Payer: Medicare Other | Admitting: Physician Assistant

## 2020-07-25 VITALS — BP 130/90 | HR 72 | Ht 66.0 in | Wt 211.0 lb

## 2020-07-25 DIAGNOSIS — Z86010 Personal history of colonic polyps: Secondary | ICD-10-CM | POA: Diagnosis not present

## 2020-07-25 DIAGNOSIS — K640 First degree hemorrhoids: Secondary | ICD-10-CM | POA: Diagnosis not present

## 2020-07-25 NOTE — Patient Instructions (Signed)
If you are age 76 or older, your body mass index should be between 23-30. Your Body mass index is 34.06 kg/m. If this is out of the aforementioned range listed, please consider follow up with your Primary Care Provider.  If you are age 17 or younger, your body mass index should be between 19-25. Your Body mass index is 34.06 kg/m. If this is out of the aformentioned range listed, please consider follow up with your Primary Care Provider.   Start Benefiber 2 teaspoons in 8 ounces of liquid daily.  Start Miralax to the first "mark" once daily and may titrate as needed.  Follow up as needed.

## 2020-07-25 NOTE — Progress Notes (Signed)
Chief Complaint: Follow-up CT enterography  HPI:    [name] is a 74 year old female with a past medical history as listed below including A. fib on Eliquis, known to Dr. Rush Landmark, who presents clinic today for follow-up of her CT enterography.    06/27/2020 Dr. Rush Landmark noted that after extensive discussion with the patient as well as discussion between cardiology and internal medicine they decided the patient's risk of coming off anticoagulation was significant to the point that patient try to minimize any disruption.  In an effort to do this her colonoscopy was canceled and a CT colonography was ordered.  Was recommended she have a follow-up in clinic to do an anoscopy to evaluate anorectum.  The plan was to only pursue colonoscopy findings on CT colonography or anorectal endoscopy were concerning.    07/18/2020 virtual colonoscopy with mildly constrained evaluation due to layering fluid and under distention of the left colon, with that constraint there are no significant colonic polyps, masses, apple core lesions or strictures, sigmoid diverticulosis without evidence of diverticulitis and stable postsurgical and ancillary findings.    Today, the patient tells me that she is chronically constipated and uses MiraLAX and stool softeners about every 3 to 4 days to achieve a bowel movement but does describe some leakage of stool after that as she typically has a solid stool first and then loose stool afterwards before getting everything out.  She will have a normal stool about once a week.  Denies any new complaints or concerns but does tell me that the "CT colonoscopy was horrible".  She felt like the chemicals they used burned her insides.    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    Denies fever, chills, weight loss or blood in her stool.  Past Medical History:  Diagnosis Date  . Anemia   . Arthritis    osteoarthritis - knees and right shoulder  . Atrial fibrillation [facility])     ablation- 2x's-- 1st time- [facility], 2nd event at [facility] in 2008. Convergent ablation at [facility] 6/14  . Atrial fibrillation ([place])   . Blood transfusion without reported diagnosis   . Breast cancer ([place])    Dr Margot Chimes, total thyroidectomy- 1999- for cancer  . 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 Chronic bilateral pleural effusions   . Colon polyp    Dr Earlean Shawl  . Complete heart block ([place])   . Complication of anesthesia    Ketamine produces LSD reaction, bright colored nightmarish experience   . Dyslipidemia   . Dyspnea   . Endometriosis   . Fibroids   . H/O pleural effusion    s/p thoracentesis w 3265m withdrawn  . Hepatitis    [place] ?hepatitis   . History of dysphagia    due to radiation therapy  . History of hiatal hernia    small noted on PET scan  . History of kidney stones   . Hx of thyroid cancer    Dr SForde Dandy . Hyperlipidemia   . Hypertension   . Hypothyroidism   . Lung cancer, lower lobe ([place] 01/2017   radiation RX completed 03/04/17; will start chemo 6/27, pt unaware of lung cancer  . Morbid obesity ([place]    Status post lap band surgery  . Nephrolithiasis   . Non Hodgkin's lymphoma ([place]    on chemotherapy  . Persistent atrial fibrillation ([facility]    a. s/p PVI 2008 b. s/p convergent ablation 20737complicated by  bradycardia requiring pacemaker implant  . Personal history of radiation therapy   . Presence of permanent cardiac pacemaker   . Rotator cuff tear    Right  . Sinus node dysfunction ([place])    Complicating convergent ablation 6/14  . Stroke [facility])    2003- [place] x2  . SVC syndrome    with lung mass and non hodgkins lymphoma  . Thyroid cancer ([place]) 2000    Past Surgical History:  Procedure Laterality Date  . ABDOMINAL HYSTERECTOMY  1983  . afib ablation     a. 2008 PVI b. 2014 convergent ablation  . APPENDECTOMY    . BONE MARROW BIOPSY  02/21/2017  . BREAST LUMPECTOMY Left 2010  . bso  1998  . CARDIAC CATHETERIZATION      2015- negative  . CARDIOVERSION  10/09/2012   Procedure: CARDIOVERSION;  Surgeon: Minus Breeding, MD;  Location: [place];  Service: Cardiovascular;  Laterality: N/A;  . CARDIOVERSION  10/09/2012   Procedure: CARDIOVERSION;  Surgeon: Minus Breeding, MD;  Location: [facility] ENDOSCOPY;  Service: Cardiovascular;  Laterality: N/A;  [name] gave the ok to add pt to the add on , but we must check to find out if the can add pt on at [phone] ( [phone])  . CARDIOVERSION N/A 11/20/2012   Procedure: CARDIOVERSION;  Surgeon: Fay Records, MD;  Location: [place];  Service: Cardiovascular;  Laterality: N/A;  . CARDIOVERSION N/A 07/18/2017   Procedure: CARDIOVERSION;  Surgeon: Thayer Headings, MD;  Location: [facility] ENDOSCOPY;  Service: Cardiovascular;  Laterality: N/A;  . CARDIOVERSION N/A 10/03/2017   Procedure: CARDIOVERSION;  Surgeon: Sanda Klein, MD;  Location: [facility] ENDOSCOPY;  Service: Cardiovascular;  Laterality: N/A;  . CARDIOVERSION N/A 01/07/2018   Procedure: CARDIOVERSION;  Surgeon: Thayer Headings, MD;  Location: [facility] ENDOSCOPY;  Service: Cardiovascular;  Laterality: N/A;  . CARDIOVERSION N/A 12/10/2019   Procedure: CARDIOVERSION;  Surgeon: Buford Dresser, MD;  Location: [facility] ENDOSCOPY;  Service: Cardiovascular;  Laterality: N/A;  . CHOLECYSTECTOMY    . COLONOSCOPY W/ POLYPECTOMY     Dr Earlean Shawl  . CYSTOSCOPY N/A 02/06/2015   Procedure: CYSTOSCOPY;  Surgeon: Kathie Rhodes, MD;  Location: [facility];  Service: Urology;  Laterality: N/A;  . CYSTOSCOPY W/ RETROGRADES Left 11/17/2017   Procedure: CYSTOSCOPY WITH RETROGRADE /PYELOGRAM/;  Surgeon: Kathie Rhodes, MD;  Location: [facility];  Service: Urology;  Laterality: Left;  . CYSTOSCOPY WITH RETROGRADE PYELOGRAM, URETEROSCOPY AND STENT PLACEMENT Right 02/06/2015   Procedure: RETROGRADE PYELOGRAM, RIGHT URETEROSCOPY STENT PLACEMENT;  Surgeon: Kathie Rhodes, MD;  Location: [facility];  Service: Urology;  Laterality: Right;  . CYSTOSCOPY WITH RETROGRADE PYELOGRAM, URETEROSCOPY AND  STENT PLACEMENT Right 03/07/2017   Procedure: CYSTOSCOPY WITH RIGHT RETROGRADE PYELOGRAM,RIGHT  URETEROSCOPYLASER LITHOTRIPSY  AND STENT PLACEMENT AND STONE BASKETRY;  Surgeon: Kathie Rhodes, MD;  Location: [place];  Service: Urology;  Laterality: Right;  . EYE SURGERY     cataract surgery  . fatty mass removal  1999   pubic area  . HOLMIUM LASER APPLICATION N/A 05/10/2992   Procedure: HOLMIUM LASER APPLICATION;  Surgeon: Kathie Rhodes, MD;  [facility]: [facility];  Service: Urology;  Laterality: N/A;  . HOLMIUM LASER APPLICATION Right 04/07/9677   Procedure: HOLMIUM LASER APPLICATION;  Surgeon: Kathie Rhodes, MD;  Location: [facility];  Service: Urology;  Laterality: Right;  . HOLMIUM LASER APPLICATION Left 9/38/1017   Procedure: HOLMIUM LASER APPLICATION;  Surgeon: Kathie Rhodes, MD;  Location: [facility];  Service: Urology;  Laterality: Left;  .  IR FLUORO GUIDE PORT INSERTION RIGHT  02/24/2017  . IR NEPHROSTOMY PLACEMENT RIGHT  11/17/2017  . IR PATIENT EVAL TECH 0-60 MINS  03/11/2017  . IR REMOVAL TUN ACCESS W/ PORT W/O FL MOD SED  04/20/2018  . IR US GUIDE VASC ACCESS RIGHT  02/24/2017  . KNEE ARTHROSCOPY     bilateral  . LAPAROSCOPIC GASTRIC BANDING  07/10/2010  . LAPAROSCOPIC GASTRIC BANDING     Laparoscopic adjustable banding APS System with posterior hiatal hernia, 2 suture.  Marland Kitchen LAPAROTOMY     for ruptured ovary and ovarian artery   . NEPHROLITHOTOMY Right 11/17/2017   Procedure: NEPHROLITHOTOMY PERCUTANEOUS;  Surgeon: Kathie Rhodes, MD;  [facility]: [facility];  Service: Urology;  Laterality: Right;  . PACEMAKER INSERTION  03/10/2013   MDT dual chamber PPM  . POCKET REVISION N/A 12/08/2013   Procedure: POCKET REVISION;  Surgeon: Deboraha Sprang, MD;  Location: [facility] CATH LAB;  Service: Cardiovascular;  Laterality: N/A;  . PORTA CATH INSERTION    . REVERSE SHOULDER ARTHROPLASTY Right 05/14/2018   Procedure: RIGHT REVERSE SHOULDER ARTHROPLASTY;  Surgeon: Tania Ade, MD;   Location: [place];  Service: Orthopedics;  Laterality: Right;  . REVERSE SHOULDER REPLACEMENT Right 05/14/2018  . RIGHT HEART CATH N/A 07/21/2019   Procedure: RIGHT HEART CATH;  Surgeon: Larey Dresser, MD;  Location: [place] CV LAB;  Service: Cardiovascular;  Laterality: N/A;  . TEE WITH CARDIOVERSION  09/22/2017  . TEE WITHOUT CARDIOVERSION N/A 10/03/2017   Procedure: TRANSESOPHAGEAL ECHOCARDIOGRAM (TEE);  Surgeon: Sanda Klein, MD;  Location: [facility] ENDOSCOPY;  Service: Cardiovascular;  Laterality: N/A;  . TEE WITHOUT CARDIOVERSION N/A 08/04/2019   Procedure: TRANSESOPHAGEAL ECHOCARDIOGRAM (TEE);  Surgeon: Larey Dresser, MD;  Location: [facility] ENDOSCOPY;  Service: Cardiovascular;  Laterality: N/A;  . TEE WITHOUT CARDIOVERSION N/A 12/10/2019   Procedure: TRANSESOPHAGEAL ECHOCARDIOGRAM (TEE);  Surgeon: Buford Dresser, MD;  Location: [facility] ENDOSCOPY;  Service: Cardiovascular;  Laterality: N/A;  . THYROIDECTOMY  1998   Dr Margot Chimes  . TONSILLECTOMY    . TOTAL KNEE ARTHROPLASTY  04/13/2012   Procedure: TOTAL KNEE ARTHROPLASTY;  Surgeon: Rudean Haskell, MD;  Location: [place];  Service: Orthopedics;  Laterality: Right;  Marland Kitchen VIDEO BRONCHOSCOPY WITH ENDOBRONCHIAL ULTRASOUND N/A 02/07/2017   Procedure: VIDEO BRONCHOSCOPY WITH ENDOBRONCHIAL ULTRASOUND;  Surgeon: Marshell Garfinkel, MD;  Location: [place];  Service: Pulmonary;  Laterality: N/A;    Current Outpatient Medications  Medication Sig Dispense Refill  . acetaminophen (TYLENOL) 500 MG tablet Take 500-1,000 mg by mouth every 6 (six) hours as needed for mild pain or headache.    Marland Kitchen amiodarone (PACERONE) 200 MG tablet Take 1 tablet (200 mg total) by mouth daily. 90 tablet 3  . calcium-vitamin D (OSCAL WITH D) 500-200 MG-UNIT tablet Take 2 tablets by mouth daily with breakfast. 180 tablet 1  . ELIQUIS 5 MG TABS tablet TAKE 1 TABLET BY MOUTH  TWICE DAILY 180 tablet 2  . furosemide (LASIX) 20 MG tablet Take 20 mg by mouth daily as needed for fluid or  edema.     Marland Kitchen levothyroxine (SYNTHROID) 137 MCG tablet Take 137 mcg by mouth See admin instructions. TAKE 1 TABLET (137 MCG) BY MOUTH DAILY, EXCEPT SATURDAYS    . Lifitegrast (XIIDRA) 5 % SOLN Place 1 drop into both eyes at bedtime.     . Multiple Vitamins-Minerals (ONE-A-DAY WOMENS 50+ ADVANTAGE) TABS Take 1 tablet by mouth daily with breakfast.    . rosuvastatin (CRESTOR) 10 MG tablet Take 10 mg by mouth  2 (two) times a week. Takes on Monday and Thursday    . senna (SENOKOT) 8.6 MG tablet Take 1 tablet by mouth as needed for constipation.     Current Facility-Administered Medications  Medication Dose Route Frequency Provider Last Rate Last Admin  . sodium chloride flush (NS) 0.9 % injection 3 mL  3 mL Intravenous Q12H Deboraha Sprang, MD        Allergies as of 07/25/2020 - Review Complete 07/25/2020  Allergen Reaction Noted  . Dofetilide Other (See Comments) 04/07/2013  . Ranolazine  01/31/2020  . Rivaroxaban Other (See Comments) 02/11/2012  . Rivaroxaban  12/02/2019  . Tape Other (See Comments) 12/02/2019  . Tikosyn [dofetilide] Other (See Comments) 12/02/2019  . Epinephrine Other (See Comments) 12/25/2010  . Epinephrine  12/02/2019  . Ketamine Other (See Comments) 12/02/2019  . Adhesive [tape] Rash 02/24/2017  . Ketamine Other (See Comments) 04/16/2013    Family History  Problem Relation Age of Onset  . Heart disease Father 8       MI _0   . Colon cancer Father        COLON  . Heart attack Father   . Other Mother        temporal arteritis   . Diabetes Sister   . Diabetes Brother   . Diabetes Paternal Aunt   . Diabetes Paternal Grandmother   . Esophageal cancer Neg Hx   . Inflammatory bowel disease Neg Hx   . Liver disease Neg Hx   . Pancreatic cancer Neg Hx   . Stomach cancer Neg Hx     Social History   Socioeconomic History  . Marital status: Single    Spouse name: Not on file  . Number of children: 0  . Years of education: Not on file  . Highest  education level: Master's degree (e.g., MA, MS, MEng, MEd, MSW, MBA)  Occupational History  . 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 Smoking status: Never Smoker  . Smokeless tobacco: Never Used  Vaping Use  . Vaping Use: Never used  Substance and Sexual Activity  . Alcohol use: No    Comment: none since 1990  . Drug use: Never  . Sexual activity: Never    Birth control/protection: Surgical  Other Topics Concern  . Not on file  Social History Narrative   ** Merged History Encounter **       SINGLE  NO CHILDREN NEVER SMOKED EXERCISE 3 X WK RETIRED NO CAFFEINE      Social Determinants of Health   Financial Resource Strain:   . Difficulty of Paying Living Expenses: Not on file  Food Insecurity:   . Worried About Charity fundraiser in the Last Year: Not on file  . Ran Out of Food in the Last Year: Not on file  Transportation Needs:   . Lack of Transportation (Medical): Not on file  . Lack of Transportation (Non-Medical): Not on file  Physical Activity:   . Days of Exercise per Week: Not on file  . Minutes of Exercise per Session: Not on file  Stress:   . Feeling of Stress : Not on file  Social Connections:   . Frequency of Communication with Friends and Family: Not on file  . Frequency of Social Gatherings with Friends and Family: Not on file  . Attends Religious Services: Not on file  . Active Member of Clubs or Organizations: Not on file  . Attends  Club or Organization Meetings: Not on file  . Marital Status: Not on file  Intimate Partner Violence:   . Fear of Current or Ex-Partner: Not on file  . Emotionally Abused: Not on file  . Physically Abused: Not on file  . Sexually Abused: Not on file    Review of Systems:    Constitutional: No weight loss, fever or chills Cardiovascular: No chest pain  Respiratory: No SOB Gastrointestinal: See HPI and otherwise negative   Physical Exam:  Vital signs: BP  130/90   Pulse 72   Ht _0  (1.676 m)   Wt 211 lb (95.7 kg)   SpO2 91%   BMI 34.06 kg/m   Constitutional:   Pleasant Elderly Caucasian female appears to be in NAD, Well developed, Well nourished, alert and cooperative Respiratory: Respirations even and unlabored. Lungs clear to auscultation bilaterally.   No wheezes, crackles, or rhonchi.  Cardiovascular: Normal S1, S2. No MRG. Regular rate and rhythm. No peripheral edema, cyanosis or pallor.  Gastrointestinal:  Soft, nondistended, nontender. No rebound or guarding. Normal bowel sounds. No appreciable masses or hepatomegaly. Rectal: External: Small hemorrhoid tag posteriorly; internal: No mass, no tenderness; anoscopy: Grade 1 hemorrhoids, no mass or lesion Psychiatric: Oriented to person, place and time. Demonstrates good judgement and reason without abnormal affect or behaviors.  See HPI for recent imaging.  Assessment: 1.  Personal history of colon polyps: Recently normal CT colonoscopy 2.  Grade 1 hemorrhoids: Seen at time of exam today 3.  Constipation with some fecal leakage: Leakage likely due to hemorrhoids above  Plan: 1.  Discussed with patient that there is no need for further screening/surveillance of her history of polyps given her recently normal CT colonoscopy and anoscopy today. 2.  Briefly discussed a grade 1 hemorrhoids, these do not bother her at all but explained that they possibly become enlarged at times which leads to her leakage after times of constipation. 3.  Recommend the patient start a fiber supplement such as Benefiber once daily and use MiraLAX "the first line".  She can titrate the MiraLAX as needed but would recommend a Benefiber daily. 4.  Patient to follow in clinic with Dr. Rush Landmark as needed in the future.  Ellouise Newer, PA-C [facility] 07/25/2020, 1:33 PM  Cc: [name], MD

## 2020-08-01 ENCOUNTER — Telehealth: Payer: Self-pay

## 2020-08-01 NOTE — Telephone Encounter (Signed)
Patient called to ask why she needed another CT scan. Directed her to call her cancer doctor; he is the one who ordered it. Patient verbalized understanding.

## 2020-08-04 ENCOUNTER — Encounter: Payer: Self-pay | Admitting: Hematology and Oncology

## 2020-08-04 IMAGING — CT CT HEAD WITHOUT CONTRAST
3 of 4 series · 16 of 47 positions shown, 19 images · non-contrast
Comparison: MRI head 07/29/2018

CLINICAL DATA: Fall.  On anticoagulation

EXAM:
CT HEAD WITHOUT CONTRAST
TECHNIQUE: Contiguous axial images were obtained from the base of the skull
through the vertex without intravenous contrast.

[Series 4: head 2.0 h70h · axial · 0.39mm/px · z∈[-101,+37]mm · 10 of 77 slices shown, 13 images]
[im 4/77  brain]
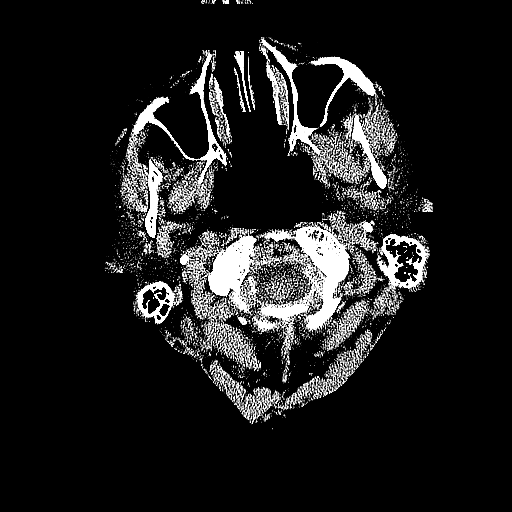
[im 4/77  bone]
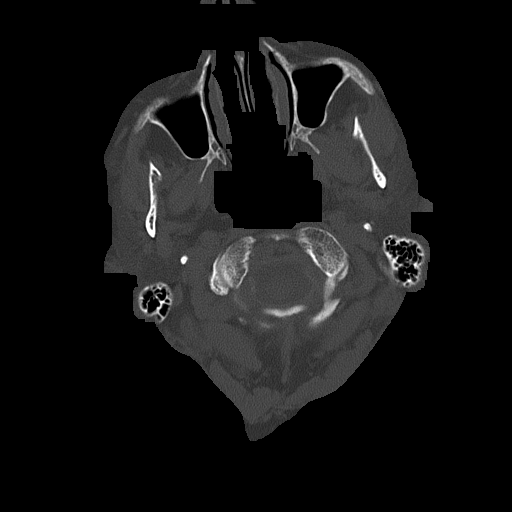
[im 12/77  brain]
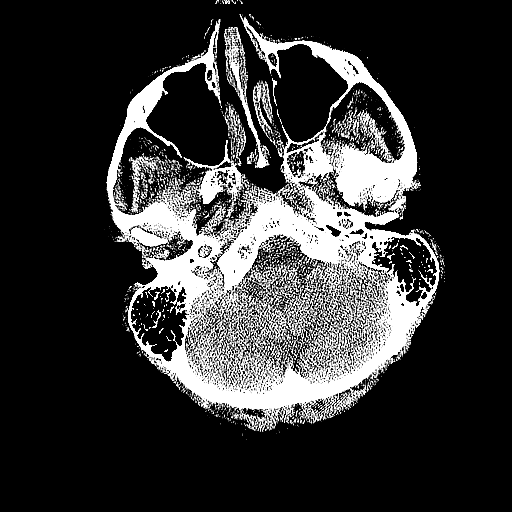
[im 20/77  brain]
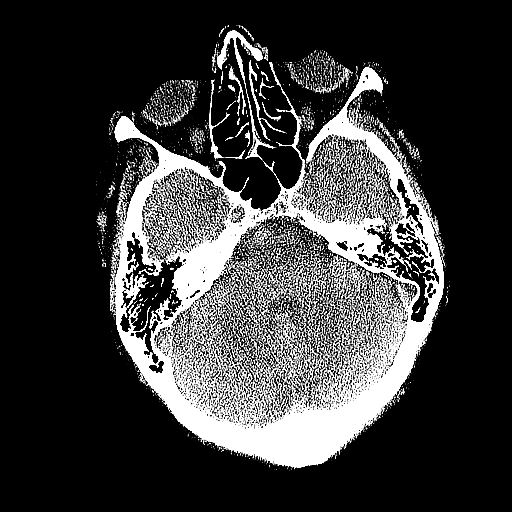
[im 27/77  brain]
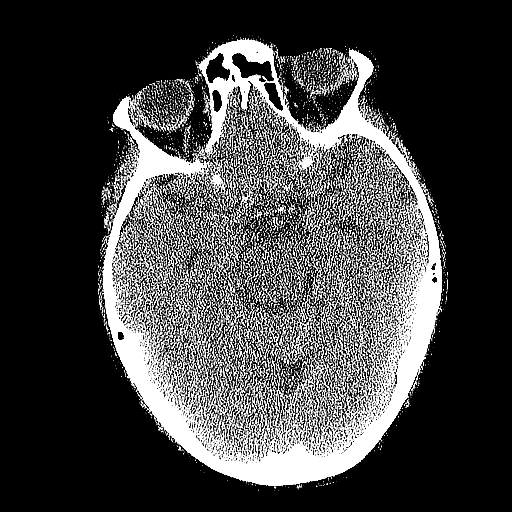
[im 35/77  brain]
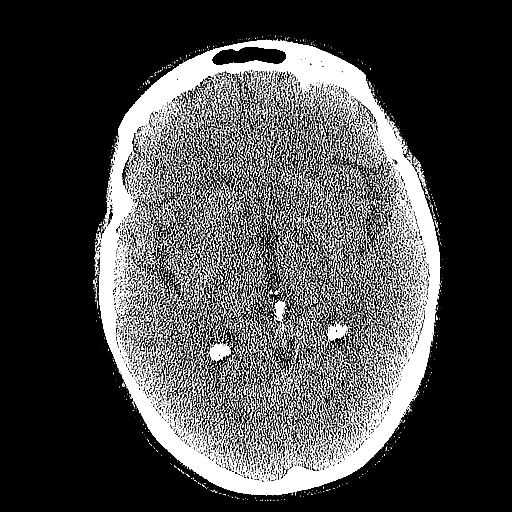
[im 35/77  bone]
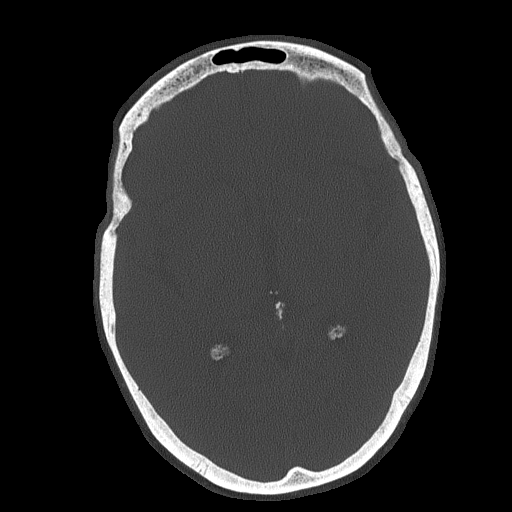
[im 42/77  brain]
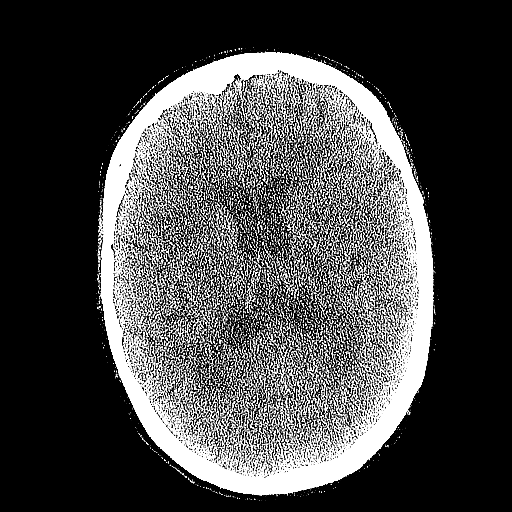
[im 50/77  brain]
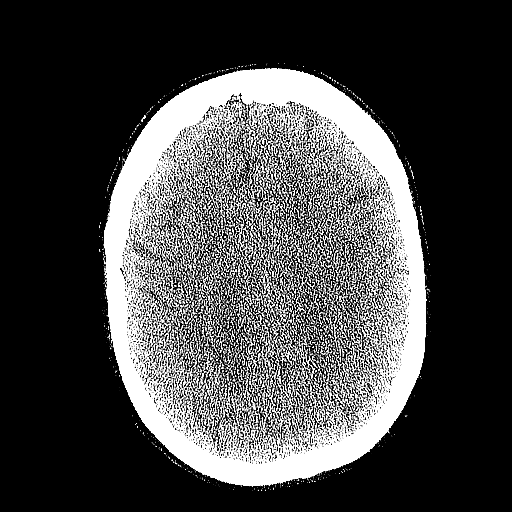
[im 58/77  brain]
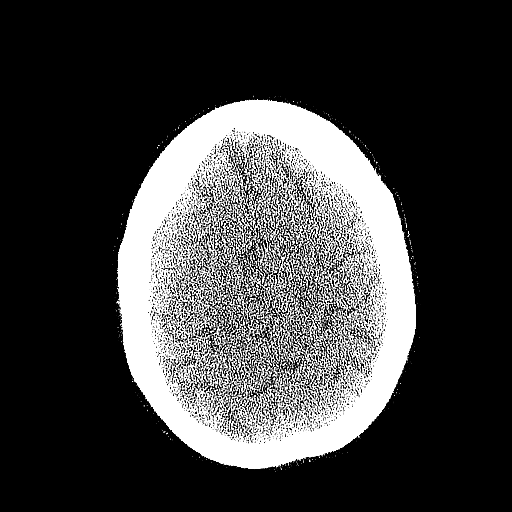
[im 65/77  brain]
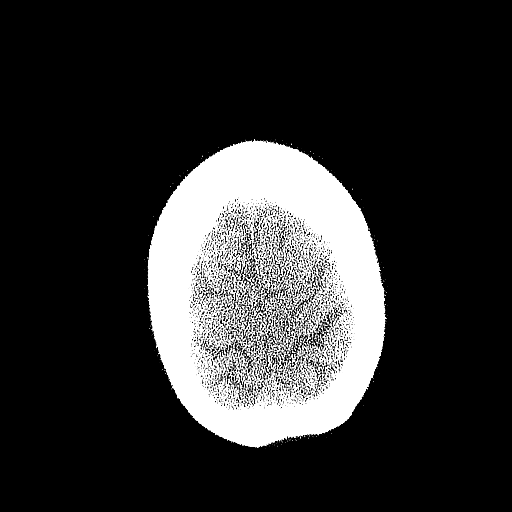
[im 65/77  bone]
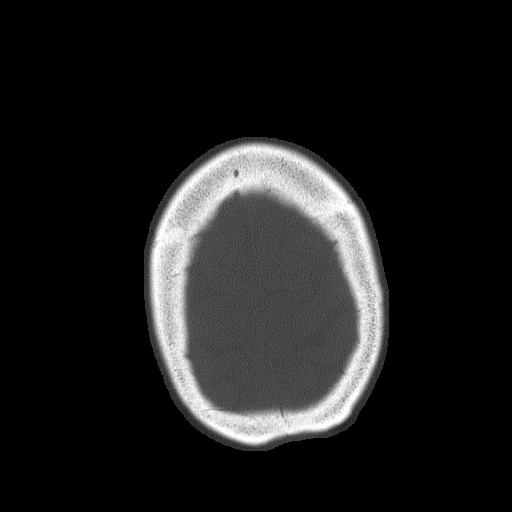
[im 73/77  brain]
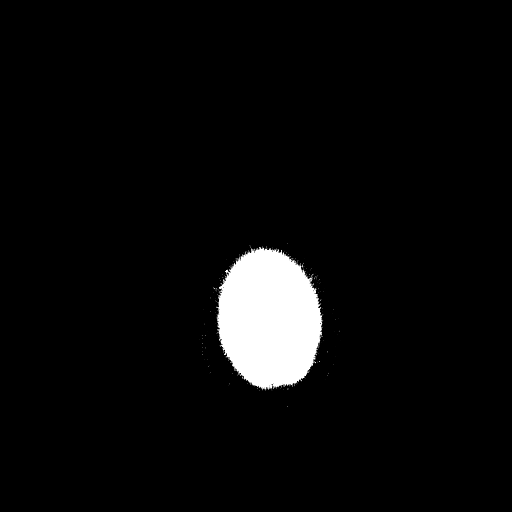

[Series 5: head 3.0 mpr cor · coronal · 0.29mm/px · 3 of 67 slices shown]
[im 23/67  brain]
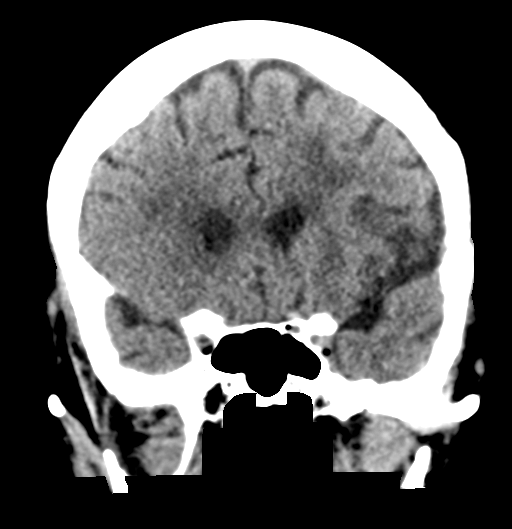
[im 30/67  brain]
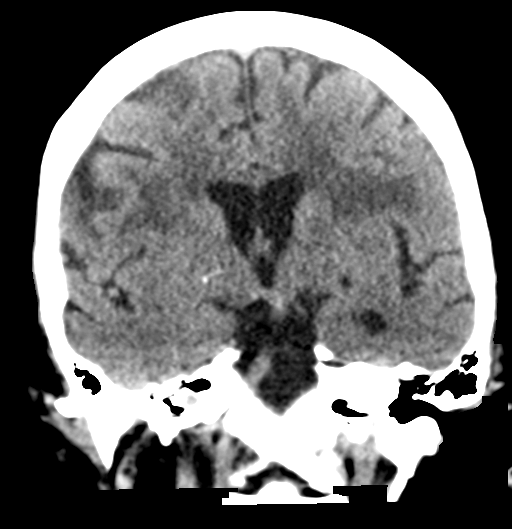
[im 37/67  brain]
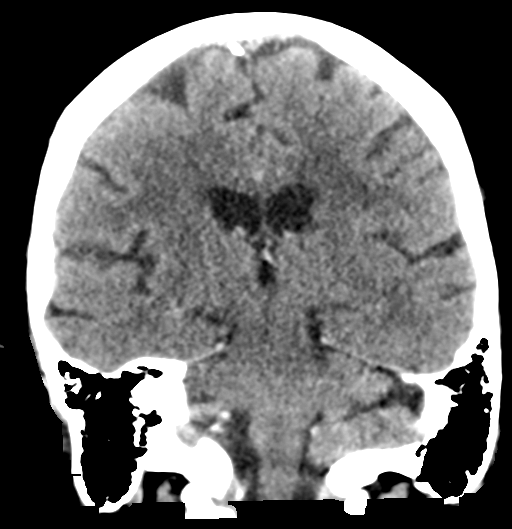

[Series 6: head 3.0 mpr sag · sagittal · 0.30mm/px · 3 of 67 slices shown]
[im 23/67  brain]
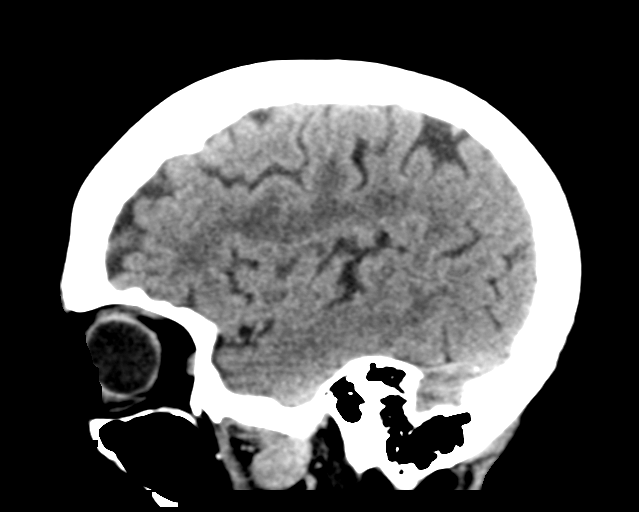
[im 34/67  brain]
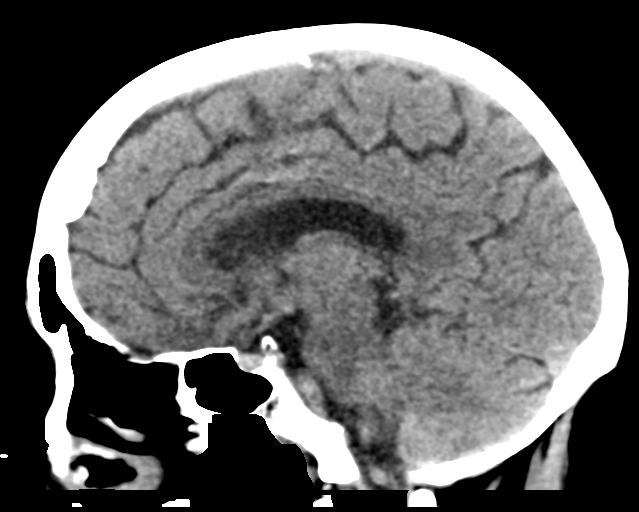
[im 45/67  brain]
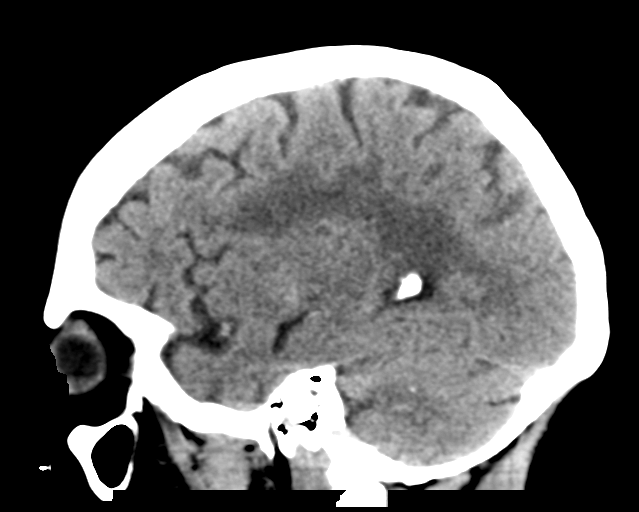

[16 of 47 positions shown; findings below may reference images not displayed]

FINDINGS: Brain: Moderate to extensive chronic white matter changes. No acute
infarct, hemorrhage, or mass. Chronic infarct head of caudate on the
left. Ventricle size normal with mild atrophy

Vascular: Negative for hyperdense vessel

Skull: Negative for skull fracture

Sinuses/Orbits: Paranasal sinuses clear.  Bilateral cataract surgery

Other: None
IMPRESSION: No acute intracranial abnormality. Moderate to extensive chronic
microvascular ischemic change in the white matter.

## 2020-08-04 IMAGING — CR CHEST - 2 VIEW
2 series · 2 of 2 positions shown · non-contrast
Comparison: 12/17/2018 chest radiograph.

CLINICAL DATA: Syncope

EXAM:
CHEST - 2 VIEW

[chest pa]
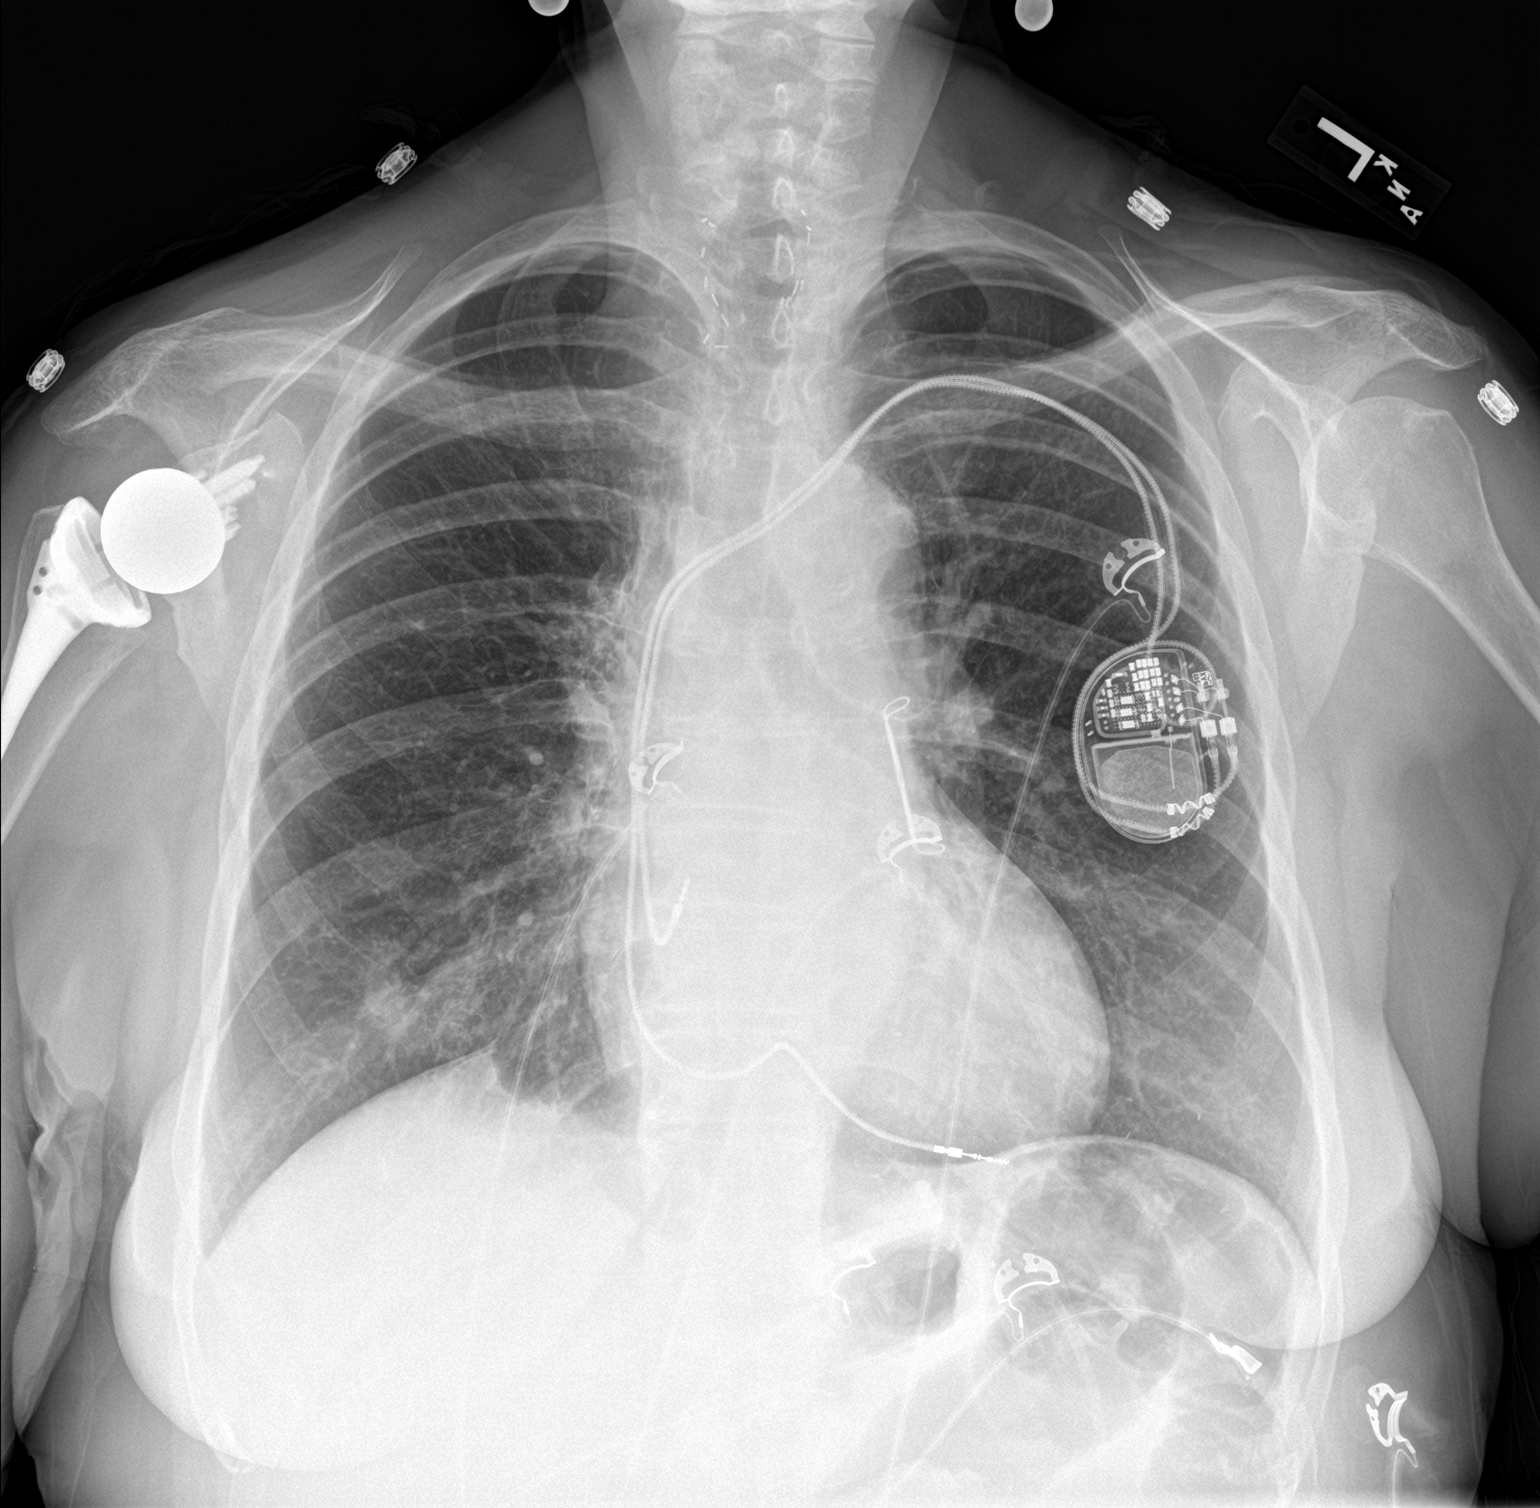

[chest lat]
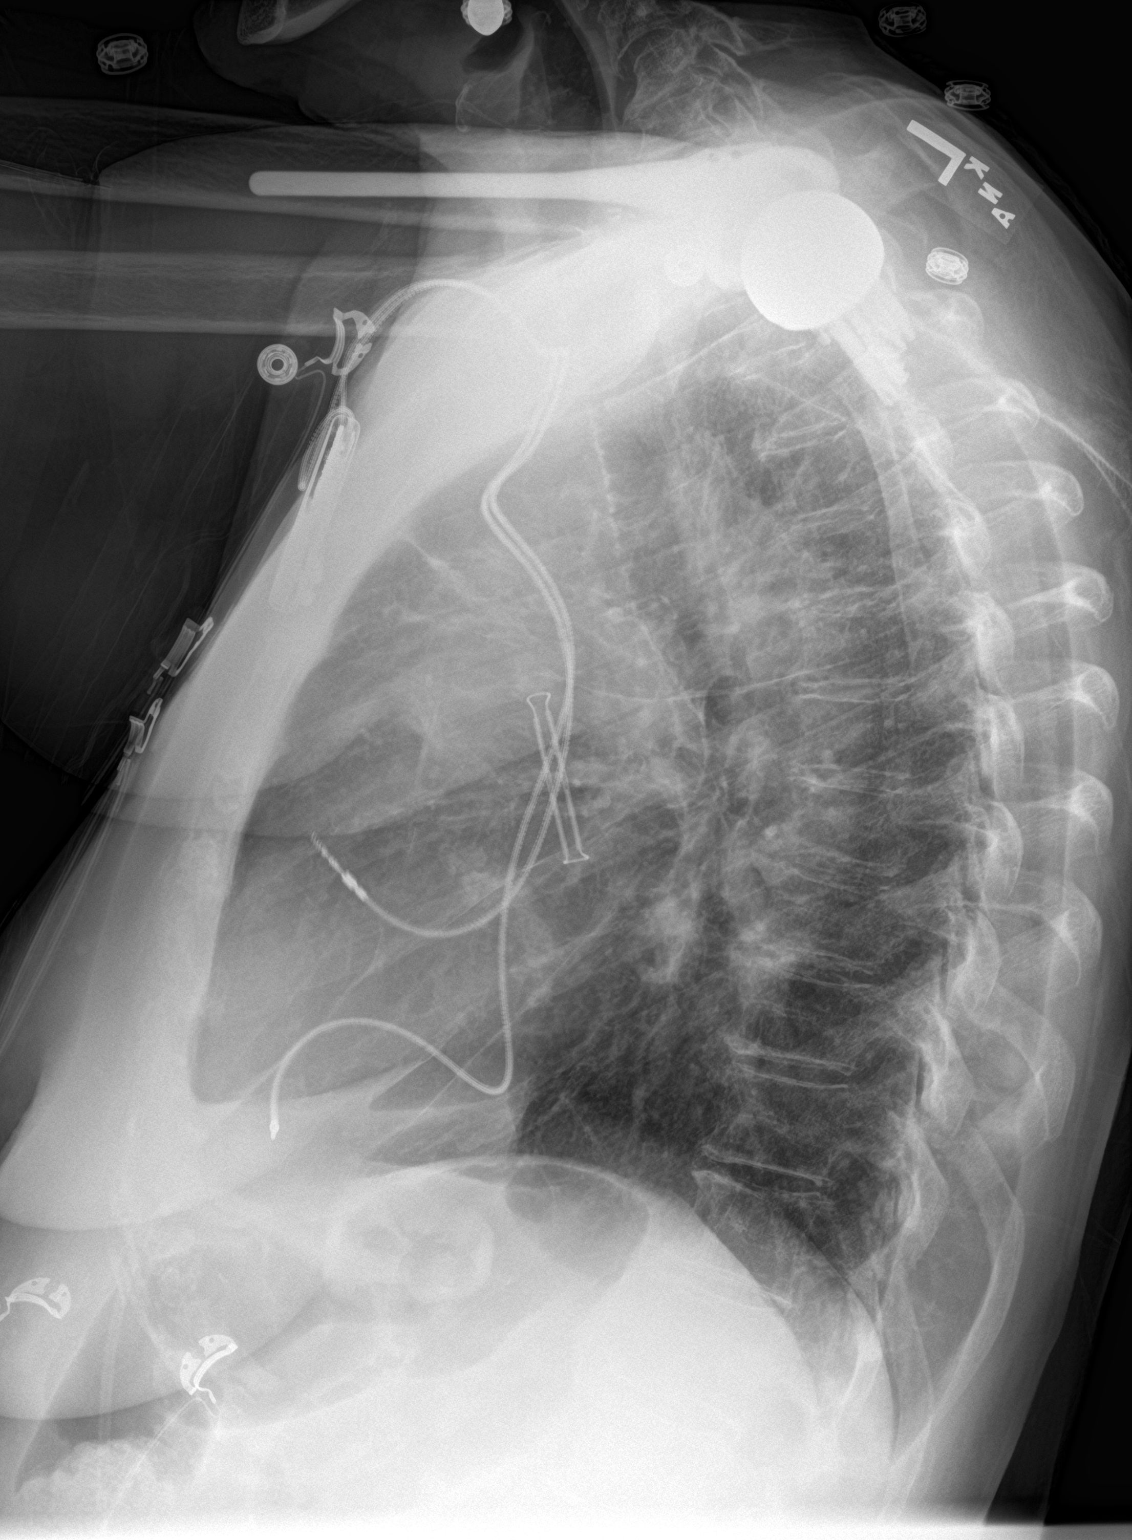

[2 of 2 positions shown; findings below may reference images not displayed]

FINDINGS: Stable configuration of 2 lead left subclavian pacemaker. Left
atrial appendage clip in place. Partially visualized right total
shoulder arthroplasty. Surgical clips overlie the medial lower neck
bilaterally. Stable cardiomediastinal silhouette with normal heart
size. No pneumothorax. No pleural effusion. No pulmonary edema.
Small nodular opacity at the right lung base appears new.
IMPRESSION: Small nodular opacity at the right lung base appears new, more
likely infectious or inflammatory or artifactual given the short
imaging interval. Recommend attention on follow-up PA and lateral
chest radiographs in 1-2 months.

## 2020-08-04 IMAGING — CR LUMBAR SPINE - COMPLETE 4+ VIEW
5 series · 5 of 5 positions shown · non-contrast
Comparison: 08/21/2018 lumbar spine MRI.

CLINICAL DATA: Syncope yesterday. Worsening back pain. History of
lung and breast cancer.

EXAM:
LUMBAR SPINE - COMPLETE 4+ VIEW

[l-spine ap]
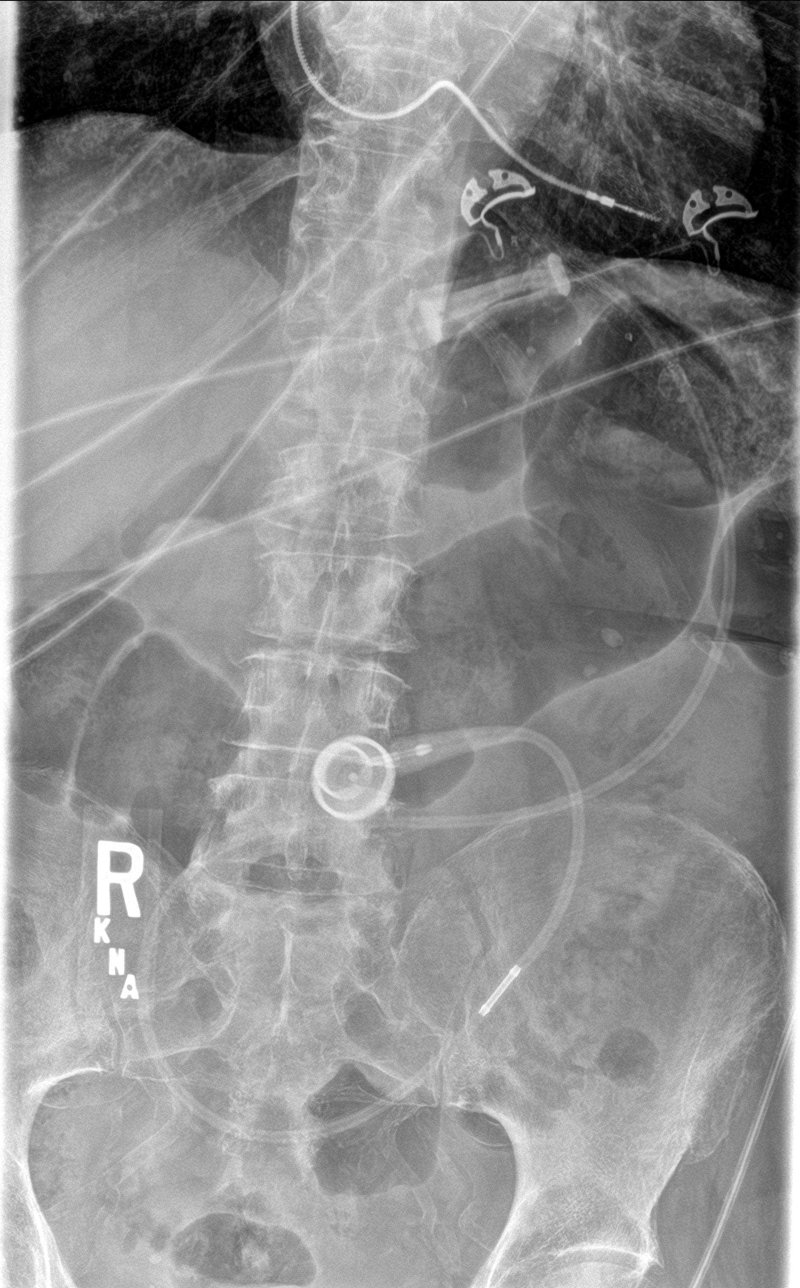

[l-spine obl (1 of 2)]
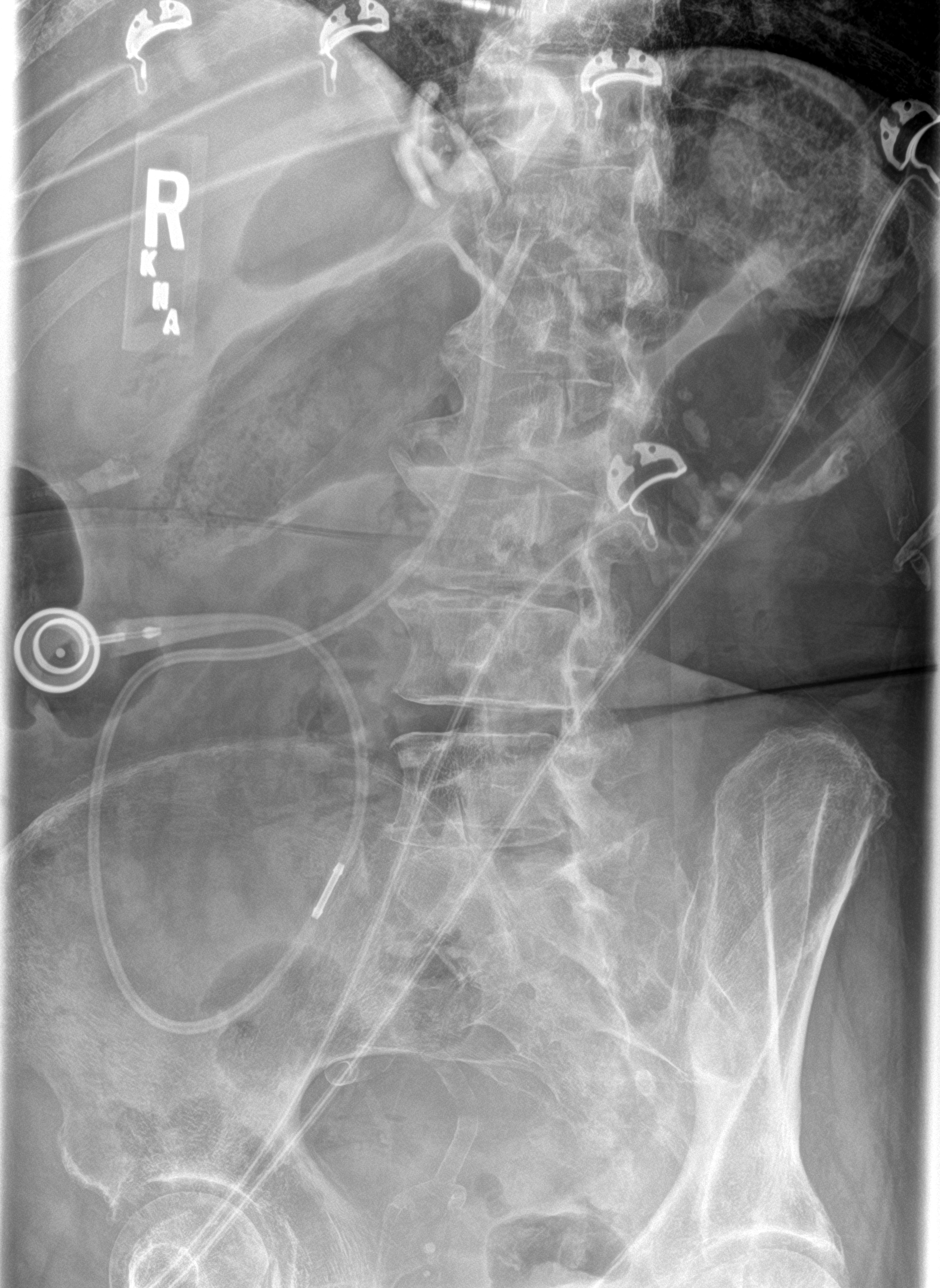

[l-spine obl (2 of 2)]
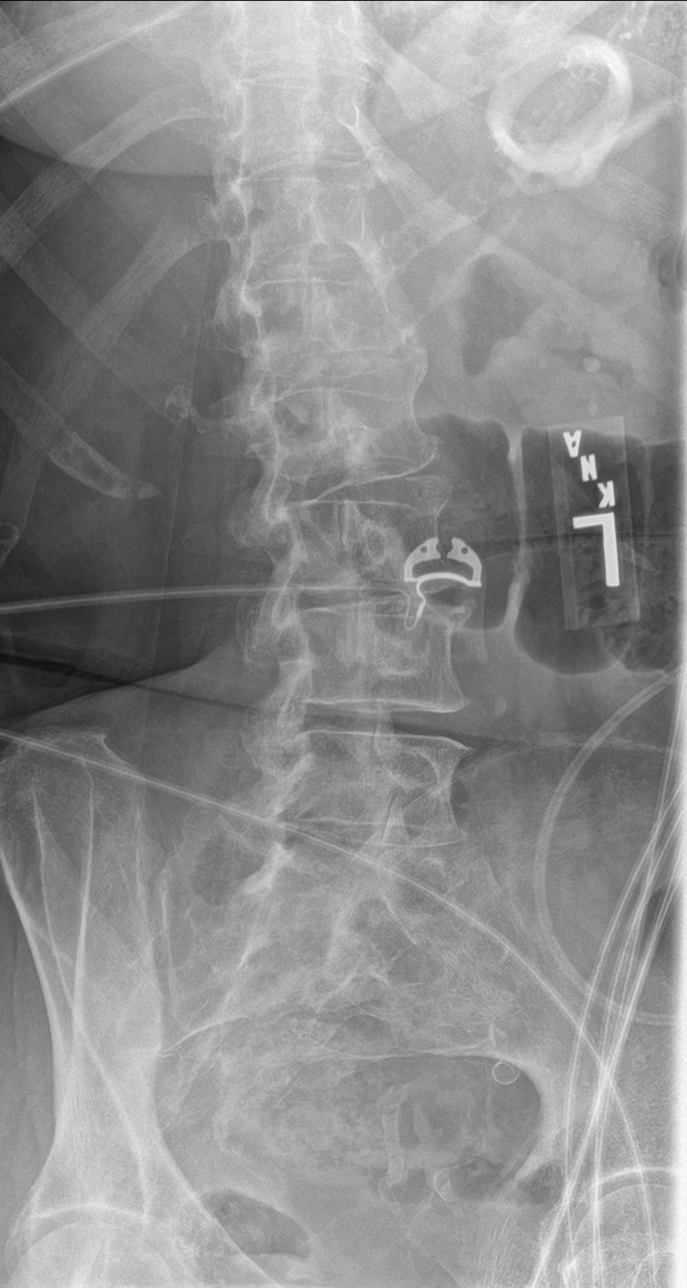

[l-spine lat]
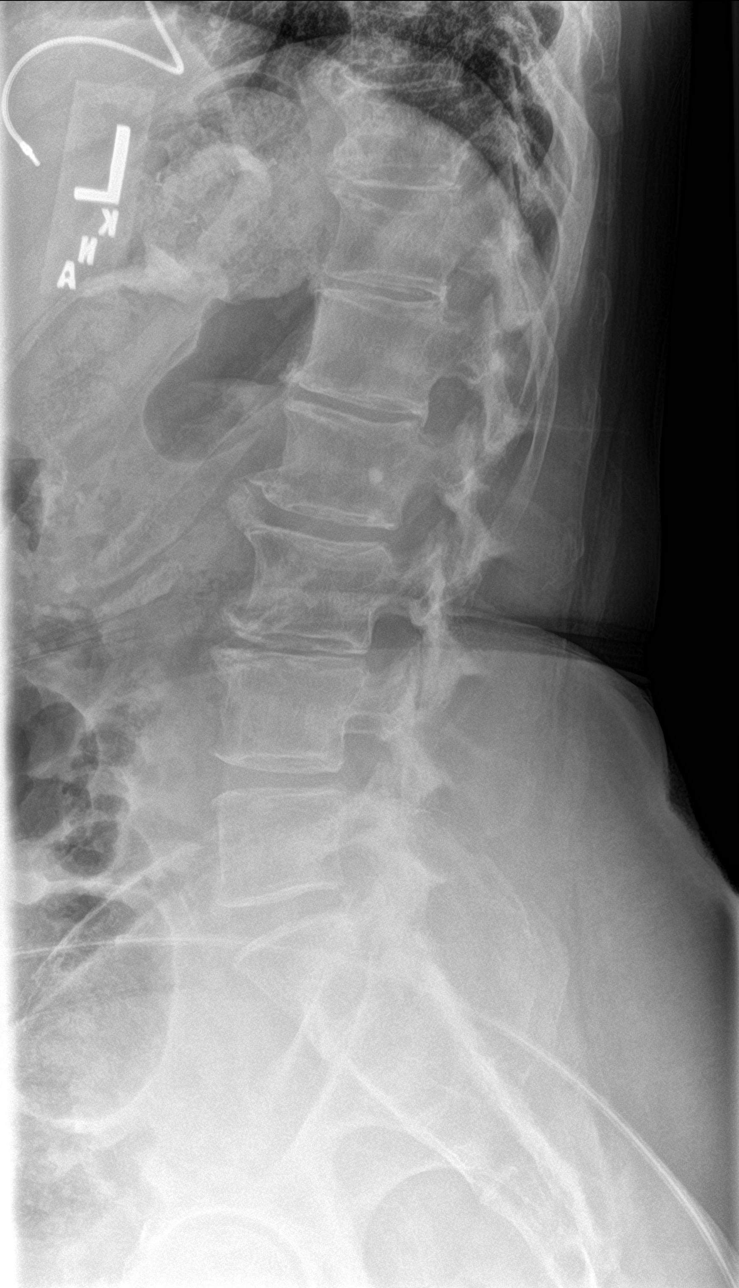

[l-spine spot]
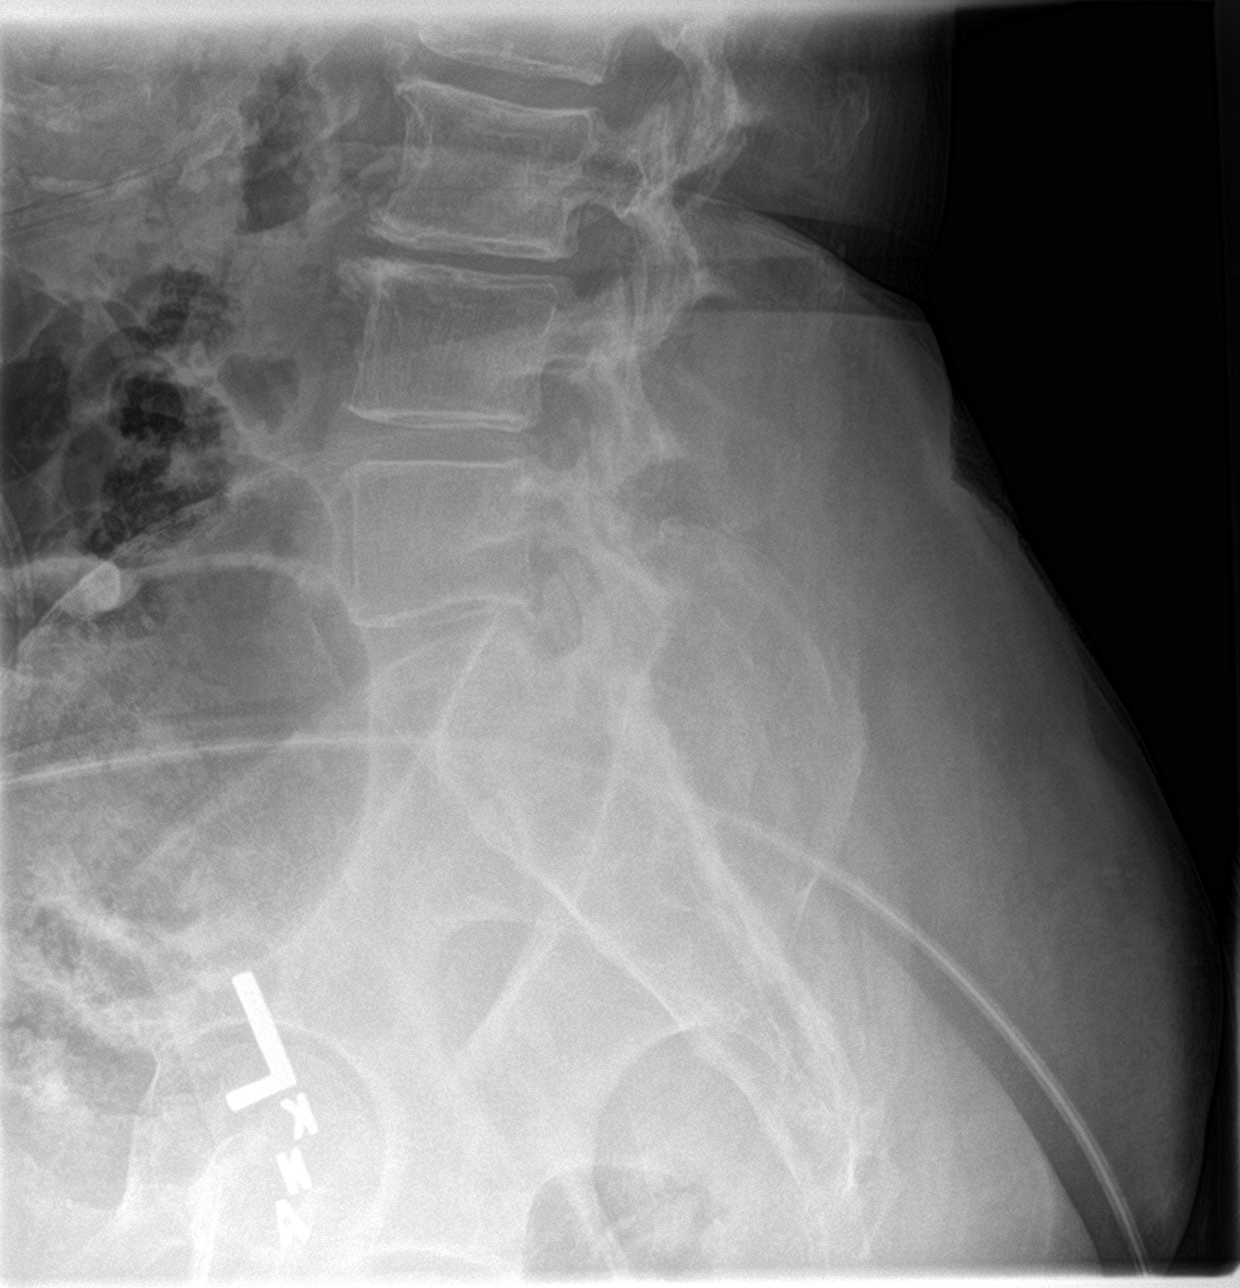

[5 of 5 positions shown; findings below may reference images not displayed]

FINDINGS: This report assumes 5 non rib-bearing lumbar vertebrae. Intact
appearing gastric band in expected location overlying the proximal
stomach with port overlying lower abdomen. Pacemaker lead terminates
over the right ventricular apex.

Lumbar vertebral body heights are preserved, with no fracture.

Moderate multilevel degenerative disc disease throughout the
visualized thoracolumbar spine, most prominent at L3-4. No
spondylolisthesis. Mild bilateral lower lumbar facet arthropathy. No
aggressive appearing focal osseous lesions.
IMPRESSION: 1. No evidence of lumbar spine fracture or acute malalignment.
2. Moderate multilevel lumbar degenerative disc disease, most
prominent at L3-4.
3. Mild bilateral lower lumbar facet arthropathy.

## 2020-08-14 DIAGNOSIS — I700 Atherosclerosis of aorta: Secondary | ICD-10-CM | POA: Diagnosis not present

## 2020-08-14 DIAGNOSIS — G609 Hereditary and idiopathic neuropathy, unspecified: Secondary | ICD-10-CM | POA: Diagnosis not present

## 2020-08-14 DIAGNOSIS — M8589 Other specified disorders of bone density and structure, multiple sites: Secondary | ICD-10-CM | POA: Diagnosis not present

## 2020-08-14 DIAGNOSIS — J432 Centrilobular emphysema: Secondary | ICD-10-CM | POA: Diagnosis not present

## 2020-08-14 DIAGNOSIS — C73 Malignant neoplasm of thyroid gland: Secondary | ICD-10-CM | POA: Diagnosis not present

## 2020-08-14 DIAGNOSIS — E785 Hyperlipidemia, unspecified: Secondary | ICD-10-CM | POA: Diagnosis not present

## 2020-08-14 DIAGNOSIS — C884 Extranodal marginal zone B-cell lymphoma of mucosa-associated lymphoid tissue [MALT-lymphoma]: Secondary | ICD-10-CM | POA: Diagnosis not present

## 2020-08-14 DIAGNOSIS — E890 Postprocedural hypothyroidism: Secondary | ICD-10-CM | POA: Diagnosis not present

## 2020-08-14 DIAGNOSIS — N1831 Chronic kidney disease, stage 3a: Secondary | ICD-10-CM | POA: Diagnosis not present

## 2020-08-14 DIAGNOSIS — C50911 Malignant neoplasm of unspecified site of right female breast: Secondary | ICD-10-CM | POA: Diagnosis not present

## 2020-08-14 DIAGNOSIS — I480 Paroxysmal atrial fibrillation: Secondary | ICD-10-CM | POA: Diagnosis not present

## 2020-08-14 DIAGNOSIS — I2510 Atherosclerotic heart disease of native coronary artery without angina pectoris: Secondary | ICD-10-CM | POA: Diagnosis not present

## 2020-08-15 ENCOUNTER — Other Ambulatory Visit: Payer: Self-pay

## 2020-08-15 ENCOUNTER — Ambulatory Visit (HOSPITAL_COMMUNITY)
Admission: RE | Admit: 2020-08-15 | Discharge: 2020-08-15 | Disposition: A | Discharge status: Home / Self Care | Payer: Medicare Other | Source: Ambulatory Visit | Attending: Vascular Surgery | Admitting: Vascular Surgery

## 2020-08-15 ENCOUNTER — Ambulatory Visit (INDEPENDENT_AMBULATORY_CARE_PROVIDER_SITE_OTHER): Payer: Medicare Other | Admitting: Physician Assistant

## 2020-08-15 VITALS — BP 130/89 | HR 70 | Temp 98.2°F | Wt 209.4 lb

## 2020-08-15 DIAGNOSIS — M7989 Other specified soft tissue disorders: Secondary | ICD-10-CM | POA: Insufficient documentation

## 2020-08-15 DIAGNOSIS — I890 Lymphedema, not elsewhere classified: Secondary | ICD-10-CM | POA: Diagnosis not present

## 2020-08-15 DIAGNOSIS — I8390 Asymptomatic varicose veins of unspecified lower extremity: Secondary | ICD-10-CM

## 2020-08-15 NOTE — Progress Notes (Signed)
Requested by:  [name], [place],  [place] [id number]  Reason for consultation: dysmorphic lower extemities    History of Present Illness   Kayla Harrison is a 74 y.o. ([birth year]) female who presents for evaluation of localized bulging of lower extremities. Not really described as swelling, but has noticed the change in the shape of her legs since her 40s. Complains of bilateral lower extremity heaviness and coolness of anterior aspect of her lower legs. She is unable to wear usual ladies slacks. No history of DVT or previous venous abnormality or intervention. She has tried using compression stockings but the shape of her legs prohibits proper sizing.  Venous symptoms include: positive if (X) [  ] aching [x  ] heavy [ x ] tired  [  ] throbbing [  ] burning  [  ] itching [  ]swelling [  ] bleeding [  ] ulcer Onset/duration:  decades  Occupation:  retired Aggravating factors: none Alleviating factors: none Compression:  yes Helps:   Pain medications:  No pain Previous vein procedures:  no History of DVT:  no  Past Medical History:  Diagnosis Date  . Anemia   . Arthritis    osteoarthritis - knees and right shoulder  . Atrial fibrillation [facility])    ablation- 2x's-- 1st time- [facility], 2nd event at [facility] in 2008. Convergent ablation at [facility] 6/14  . Atrial fibrillation ([place])   . Blood transfusion without reported diagnosis   . Breast cancer ([place])    Dr Margot Chimes, total thyroidectomy- 1999- for cancer  . 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 Chronic bilateral pleural effusions   . Colon polyp    Dr Earlean Shawl  . Complete heart block ([place])   . Complication of anesthesia    Ketamine produces LSD reaction, bright colored nightmarish experience   . Dyslipidemia   . Dyspnea   . Endometriosis   . Fibroids   . H/O pleural effusion    s/p thoracentesis w 3232m withdrawn  . Hepatitis    [place] ?hepatitis   . History of dysphagia     due to radiation therapy  . History of hiatal hernia    small noted on PET scan  . History of kidney stones   . Hx of thyroid cancer    Dr SForde Dandy . Hyperlipidemia   . Hypertension   . Hypothyroidism   . Lung cancer, [place] lobe [place]   radiation RX completed 03/04/17; will start chemo 6/27, pt unaware of lung cancer  . Morbid [place] ([place]    Status post lap band surgery  . Nephrolithiasis   . Non Hodgkin's lymphoma ([place]    on chemotherapy  . Persistent atrial fibrillation ([place]    a. s/p PVI 2008 b. s/p convergent ablation 23300complicated by bradycardia requiring pacemaker implant  . Personal history of radiation therapy   . Presence of permanent cardiac pacemaker   . Rotator cuff tear    Right  . Sinus node dysfunction ([place]    Complicating convergent ablation 6/14  . St[place]    [place]  . SVC syndrome    with lung mass and non hodgkins lymphoma  . Thyroid cancer ([place] 2000    Past Surgical History:  Procedure Laterality Date  . ABDOMINAL HYSTERECTOMY  1983  . afib ablation     a. 2008 PVI b. 2014 convergent ablation  . APPENDECTOMY    . BONE  MARROW BIOPSY  02/21/2017  . BREAST LUMPECTOMY Left 2010  . bso  1998  . CARDIAC CATHETERIZATION     2015- negative  . CARDIOVERSION  10/09/2012   Procedure: CARDIOVERSION;  Surgeon: Minus Breeding, MD;  Location: [place];  Service: Cardiovascular;  Laterality: N/A;  . CARDIOVERSION  10/09/2012   Procedure: CARDIOVERSION;  Surgeon: Minus Breeding, MD;  Location: [facility] ENDOSCOPY;  Service: Cardiovascular;  Laterality: N/A;  [name] gave the ok to add pt to the add on , but we must check to find out if the can add pt on at [phone] ( [phone])  . CARDIOVERSION N/A 11/20/2012   Procedure: CARDIOVERSION;  Surgeon: Fay Records, MD;  Location: [facility];  Service: Cardiovascular;  Laterality: N/A;  . CARDIOVERSION N/A 07/18/2017   Procedure: CARDIOVERSION;  Surgeon: Thayer Headings, MD;  Location[facility] ENDOSCOPY;  Service:  Cardiovascular;  Laterality: N/A;  . CARDIOVERSION N/A 10/03/2017   Procedure: CARDIOVERSION;  Surgeon: Sanda Klein, MD;  Location: [facility] ENDOSCOPY;  Service: Cardiovascular;  Laterality: N/A;  . CARDIOVERSION N/A 01/07/2018   Procedure: CARDIOVERSION;  Surgeon: Thayer Headings, MD;  Location: [facility] ENDOSCOPY;  Service: Cardiovascular;  Laterality: N/A;  . CARDIOVERSION N/A 12/10/2019   Procedure: CARDIOVERSION;  Surgeon: Buford Dresser, MD;  Location: [facility] ENDOSCOPY;  Service: Cardiovascular;  Laterality: N/A;  . CHOLECYSTECTOMY    . COLONOSCOPY W/ POLYPECTOMY     Dr Earlean Shawl  . CYSTOSCOPY N/A 02/06/2015   Procedure: CYSTOSCOPY;  Surgeon: Kathie Rhodes, MD;  Location: [facility] ORS;  Service: Urology;  Laterality: N/A;  . CYSTOSCOPY W/ RETROGRADES Left 11/17/2017   Procedure: CYSTOSCOPY WITH RETROGRADE /PYELOGRAM/;  Surgeon: Kathie Rhodes, MD;  Location: [facility];  Service: Urology;  Laterality: Left;  . CYSTOSCOPY WITH RETROGRADE PYELOGRAM, URETEROSCOPY AND STENT PLACEMENT Right 02/06/2015   Procedure: RETROGRADE PYELOGRAM, RIGHT URETEROSCOPY STENT PLACEMENT;  Surgeon: Kathie Rhodes, MD;  Location: [facility];  Service: Urology;  Laterality: Right;  . CYSTOSCOPY WITH RETROGRADE PYELOGRAM, URETEROSCOPY AND STENT PLACEMENT Right 03/07/2017   Procedure: CYSTOSCOPY WITH RIGHT RETROGRADE PYELOGRAM,RIGHT  URETEROSCOPYLASER LITHOTRIPSY  AND STENT PLACEMENT AND STONE BASKETRY;  Surgeon: Kathie Rhodes, MD;  Location: [place];  Service: Urology;  Laterality: Right;  . EYE SURGERY     cataract surgery  . fatty mass removal  1999   pubic area  . HOLMIUM LASER APPLICATION N/A 01/15/9562   Procedure: HOLMIUM LASER APPLICATION;  Surgeon: Kathie Rhodes, MD;  [facility]: [facility];  Service: Urology;  Laterality: N/A;  . HOLMIUM LASER APPLICATION Right 8/75/6433   Procedure: HOLMIUM LASER APPLICATION;  Surgeon: Kathie Rhodes, MD;  Location[facility];  Service: Urology;  Laterality: Right;  .  HOLMIUM LASER APPLICATION Left 2/95/1884   Procedure: HOLMIUM LASER APPLICATION;  Surgeon: Kathie Rhodes, MD;  [facility]: [facility];  Service: Urology;  Laterality: Left;  . IR FLUORO GUIDE PORT INSERTION RIGHT  02/24/2017  . IR NEPHROSTOMY PLACEMENT RIGHT  11/17/2017  . IR PATIENT EVAL TECH 0-60 MINS  03/11/2017  . IR REMOVAL TUN ACCESS W/ PORT W/O FL MOD SED  04/20/2018  . IR US GUIDE VASC ACCESS RIGHT  02/24/2017  . KNEE ARTHROSCOPY     bilateral  . LAPAROSCOPIC GASTRIC BANDING  07/10/2010  . LAPAROSCOPIC GASTRIC BANDING     Laparoscopic adjustable banding APS System with posterior hiatal hernia, 2 suture.  Marland Kitchen LAPAROTOMY     for ruptured ovary and ovarian artery   . NEPHROLITHOTOMY Right 11/17/2017   Procedure: NEPHROLITHOTOMY PERCUTANEOUS;  Surgeon: Kathie Rhodes, MD;  [facility];  Service: Urology;  Laterality: Right;  . PACEMAKER INSERTION  03/10/2013   MDT dual chamber PPM  . POCKET REVISION N/A 12/08/2013   Procedure: POCKET REVISION;  Surgeon: Deboraha Sprang, MD;  Location[facility] CATH LAB;  Service: Cardiovascular;  Laterality: N/A;  . PORTA CATH INSERTION    . REVERSE SHOULDER ARTHROPLASTY Right 05/14/2018   Procedure: RIGHT REVERSE SHOULDER ARTHROPLASTY;  Surgeon: Tania Ade, MD;  Location: [place];  Service: Orthopedics;  Laterality: Right;  . REVERSE SHOULDER REPLACEMENT Right 05/14/2018  . RIGHT HEART CATH N/A 07/21/2019   Procedure: RIGHT HEART CATH;  Surgeon: Larey Dresser, MD;  Location: [place] CV LAB;  Service: Cardiovascular;  Laterality: N/A;  . TEE WITH CARDIOVERSION  09/22/2017  . TEE WITHOUT CARDIOVERSION N/A 10/03/2017   Procedure: TRANSESOPHAGEAL ECHOCARDIOGRAM (TEE);  Surgeon: Sanda Klein, MD;  Location: [facility] ENDOSCOPY;  Service: Cardiovascular;  Laterality: N/A;  . TEE WITHOUT CARDIOVERSION N/A 08/04/2019   Procedure: TRANSESOPHAGEAL ECHOCARDIOGRAM (TEE);  Surgeon: Larey Dresser, MD;  Location: [facility] ENDOSCOPY;  Service: Cardiovascular;  Laterality: N/A;  .  TEE WITHOUT CARDIOVERSION N/A 12/10/2019   Procedure: TRANSESOPHAGEAL ECHOCARDIOGRAM (TEE);  Surgeon: Buford Dresser, MD;  Location: [facility] ENDOSCOPY;  Service: Cardiovascular;  Laterality: N/A;  . THYROIDECTOMY  1998   Dr Margot Chimes  . TONSILLECTOMY    . TOTAL KNEE ARTHROPLASTY  04/13/2012   Procedure: TOTAL KNEE ARTHROPLASTY;  Surgeon: Rudean Haskell, MD;  Location: [place];  Service: Orthopedics;  Laterality: Right;  Marland Kitchen VIDEO BRONCHOSCOPY WITH ENDOBRONCHIAL ULTRASOUND N/A 02/07/2017   Procedure: VIDEO BRONCHOSCOPY WITH ENDOBRONCHIAL ULTRASOUND;  Surgeon: Marshell Garfinkel, MD;  Location: [place];  Service: Pulmonary;  Laterality: N/A;    Social History   Socioeconomic History  . Marital status: Single    Spouse name: Not on file  . Number of children: 0  . Years of education: Not on file  . Highest education level: Master's degree (e.g., MA, MS, MEng, MEd, MSW, MBA)  Occupational History  . 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 Smoking status: Never Smoker  . Smokeless tobacco: Never Used  Vaping Use  . Vaping Use: Never used  Substance and Sexual Activity  . Alcohol use: No    Comment: none since 1990  . Drug use: Never  . Sexual activity: Never    Birth control/protection: Surgical  Other Topics Concern  . Not on file  Social History Narrative   ** Merged History Encounter **       SINGLE  NO CHILDREN NEVER SMOKED EXERCISE 3 X WK RETIRED NO CAFFEINE      Social Determinants of Health   Financial Resource Strain:   . Difficulty of Paying Living Expenses: Not on file  Food Insecurity:   . Worried About Charity fundraiser in the Last Year: Not on file  . Ran Out of Food in the Last Year: Not on file  Transportation Needs:   . Lack of Transportation (Medical): Not on file  . Lack of Transportation (Non-Medical): Not on file  Physical Activity:   . Days of Exercise per Week: Not on file  . Minutes of Exercise  per Session: Not on file  Stress:   . Feeling of Stress : Not on file  Social Connections:   . Frequency of Communication with Friends and Family: Not on file  . Frequency of Social Gatherings with Friends and Family:  Not on file  . Attends Religious Services: Not on file  . Active Member of Clubs or Organizations: Not on file  . Attends Archivist Meetings: Not on file  . Marital Status: Not on file  Intimate Partner Violence:   . Fear of Current or Ex-Partner: Not on file  . Emotionally Abused: Not on file  . Physically Abused: Not on file  . Sexually Abused: Not on file    Family History  Problem Relation Age of Onset  . Heart disease Father 72       MI _0   . Colon cancer Father        COLON  . Heart attack Father   . Other Mother        temporal arteritis   . Diabetes Sister   . Diabetes Brother   . Diabetes Paternal Aunt   . Diabetes Paternal Grandmother   . Esophageal cancer Neg Hx   . Inflammatory bowel disease Neg Hx   . Liver disease Neg Hx   . Pancreatic cancer Neg Hx   . Stomach cancer Neg Hx     Current Outpatient Medications  Medication Sig Dispense Refill  . acetaminophen (TYLENOL) 500 MG tablet Take 500-1,000 mg by mouth every 6 (six) hours as needed for mild pain or headache.    Marland Kitchen amiodarone (PACERONE) 200 MG tablet Take 1 tablet (200 mg total) by mouth daily. 90 tablet 3  . calcium-vitamin D (OSCAL WITH D) 500-200 MG-UNIT tablet Take 2 tablets by mouth daily with breakfast. 180 tablet 1  . ELIQUIS 5 MG TABS tablet TAKE 1 TABLET BY MOUTH  TWICE DAILY 180 tablet 2  . furosemide (LASIX) 20 MG tablet Take 20 mg by mouth daily as needed for fluid or edema.     Marland Kitchen levothyroxine (SYNTHROID) 137 MCG tablet Take 137 mcg by mouth See admin instructions. TAKE 1 TABLET (137 MCG) BY MOUTH DAILY, EXCEPT SATURDAYS    . Lifitegrast (XIIDRA) 5 % SOLN Place 1 drop into both eyes at bedtime.     . Multiple Vitamins-Minerals (ONE-A-DAY WOMENS 50+ ADVANTAGE)  TABS Take 1 tablet by mouth daily with breakfast.    . rosuvastatin (CRESTOR) 10 MG tablet Take 10 mg by mouth 2 (two) times a week. Takes on Monday and Thursday    . senna (SENOKOT) 8.6 MG tablet Take 1 tablet by mouth as needed for constipation.     Current Facility-Administered Medications  Medication Dose Route Frequency Provider Last Rate Last Admin  . sodium chloride flush (NS) 0.9 % injection 3 mL  3 mL Intravenous Q12H Deboraha Sprang, MD        Allergies  Allergen Reactions  . Dofetilide Other (See Comments)    Prolonged QT interval   . Ranolazine     Balance issues  . Rivaroxaban Other (See Comments)    Nose Bleed X 6 hrs ; packing in ER Nasal hemorrhage  . Rivaroxaban     Nose Bleed X 6 hrs ; packing in ER Nasal hemorrhage  . Tape Other (See Comments)    SKIN IS THIN AND TEARS EASILY   . Tikosyn [Dofetilide] Other (See Comments)    "Long QT wave"   . Epinephrine Other (See Comments)    Oral anesthetic Dental form only (liquid). Patient stated she will become "out of it" she can hear you but cannot respond in normal fashion. "not fully with it"  . Epinephrine   . Ketamine Other (See Comments)  Hallucinations   . Adhesive [Tape] Rash    Paper tape as well  . Ketamine Other (See Comments)    HALLUCINATIONS    REVIEW OF SYSTEMS (negative unless checked):   Cardiac:  [] Chest pain or chest pressure? [] Shortness of breath upon activity? [] Shortness of breath when lying flat? [] Irregular heart rhythm?  Vascular:  [] Pain in calf, thigh, or hip brought on by walking? [] Pain in feet at night that wakes you up from your sleep? [] Blood clot in your veins? [x] Leg swelling?  Pulmonary:  [] Oxygen at home? [] Productive cough? [] Wheezing?  Neurologic:  [] Sudden weakness in arms or legs? [] Sudden numbness in arms or legs? [] Sudden onset of difficult speaking or slurred speech? [] Temporary loss of vision in one eye? [] Problems with  dizziness?  Gastrointestinal:  [] Blood in stool? [] Vomited blood?  Genitourinary:  [] Burning when urinating? [] Blood in urine?  Psychiatric:  [] Major depression  Hematologic:  [] Bleeding problems? [] Problems with blood clotting?  Dermatologic:  [] Rashes or ulcers?  Constitutional:  [] Fever or chills?  Ear/Nose/Throat:  [] Change in hearing? [] Nose bleeds? [] Sore throat?  Musculoskeletal:  [] Back pain? [] Joint pain? [] Muscle pain?   Physical Examination     Vitals:   08/15/20 1256  BP: 130/89  Pulse: 70  Temp: 98.2 F (36.8 C)  TempSrc: Skin  SpO2: 97%  Weight: 209 lb 6.4 oz (95 kg)   Body mass index is 33.8 kg/m.  General:  WDWN in NAD; vital signs documented above Gait: unaided HENT: WNL, normocephalic Pulmonary: normal non-labored breathing , without Rales, rhonchi,  wheezing Cardiac: regular HR,  Skin: without rashes Extremities: without varicose veins, with reticular veins, with edema, without stasis pigmentation, without lipodermatosclerosis, without ulcers. There is hyperplasis of the thigh and knee area bilaterally. Musculoskeletal: no muscle wasting or atrophy  Neurologic: A&O X 3;  No focal weakness or paresthesias are detected Psychiatric:  The pt has Normal affect.     Non-invasive Vascular Imaging   BLE Venous Insufficiency Duplex (08/15/2020):   RLE:   no DVT and SVT,   no GSV reflux ,  GSV diameter   positive SSV reflux , mid calf  postive deep venous reflux at SFJ   LLE:  no DVT and SVT,   no GSV reflux ,   GSV diameter   no SSV reflux ,  positive deep venous reflux at [facility], CFV   Medical Decision Making    Kayla Harrison is a 74 y.o. female who presents with: bilateral lower extremity dysmorphia with likely bilateral lymphedema. No evidence of arterial insufficiency. No evidence of DVT or significant venous reflux. C2 venous disease.    Based on the patient's history and examination,  I recommend: referral to [facility] for lymphedema pump, +/- phsycial therapy lymph massage.  She has attempted use of compression stockings in past for more than one month.  She exercises on a daily basis.  Thank you for allowing [place] to participate in this patient's care.   Barbie Banner, PA-C [facility] Office: [phone]  08/15/2020, 1:14 PM  Clinic MD: Stanford Breed

## 2020-08-16 ENCOUNTER — Ambulatory Visit (INDEPENDENT_AMBULATORY_CARE_PROVIDER_SITE_OTHER): Payer: Medicare Other | Admitting: Internal Medicine

## 2020-08-16 ENCOUNTER — Encounter: Payer: Self-pay | Admitting: Internal Medicine

## 2020-08-16 VITALS — BP 116/68 | HR 70 | Ht 66.0 in | Wt 209.6 lb

## 2020-08-16 DIAGNOSIS — R002 Palpitations: Secondary | ICD-10-CM

## 2020-08-16 DIAGNOSIS — I495 Sick sinus syndrome: Secondary | ICD-10-CM | POA: Diagnosis not present

## 2020-08-16 DIAGNOSIS — Z950 Presence of cardiac pacemaker: Secondary | ICD-10-CM

## 2020-08-16 DIAGNOSIS — I4819 Other persistent atrial fibrillation: Secondary | ICD-10-CM

## 2020-08-16 LAB — CUP PACEART INCLINIC DEVICE CHECK
Battery Remaining Longevity: 34 mo
Battery Voltage: 2.96 V
Brady Statistic AP VP Percent: 1.41 %
Brady Statistic AP VS Percent: 74.84 %
Brady Statistic AS VP Percent: 0.8 %
Brady Statistic AS VS Percent: 22.96 %
Brady Statistic RA Percent Paced: 75.91 %
Brady Statistic RV Percent Paced: 2.23 %
Date Time Interrogation Session: 20211124153722
Implantable Lead Implant Date: 20140618
Implantable Lead Implant Date: 20140618
Implantable Lead Location: 753859
Implantable Lead Location: 753860
Implantable Pulse Generator Implant Date: 20140618
Lead Channel Impedance Value: 1007 Ohm
Lead Channel Impedance Value: 1064 Ohm
Lead Channel Impedance Value: 399 Ohm
Lead Channel Impedance Value: 456 Ohm
Lead Channel Pacing Threshold Amplitude: 1 V
Lead Channel Pacing Threshold Amplitude: 1.25 V
Lead Channel Pacing Threshold Pulse Width: 0.4 ms
Lead Channel Pacing Threshold Pulse Width: 0.4 ms
Lead Channel Sensing Intrinsic Amplitude: 0.6 mV
Lead Channel Sensing Intrinsic Amplitude: 14.4 mV
Lead Channel Setting Pacing Amplitude: 2.5 V
Lead Channel Setting Pacing Amplitude: 2.5 V
Lead Channel Setting Pacing Pulse Width: 0.4 ms
Lead Channel Setting Sensing Sensitivity: 0.9 mV

## 2020-08-16 NOTE — Progress Notes (Signed)
Patient Care Team: [name], MD as PCP - General (Endocrinology) Deboraha Sprang, MD as PCP - Cardiology (Cardiology) [name], MD (Endocrinology)   HPI  Kayla Harrison is a 74 y.o. female Seen in followup for a pacemaker implanted at [facility] and atrial fibrillation.  She underwent pulmonary vein isolation at [facility] and had recurrent atrial fibrillation and underwent convergent ablation at [facility] 3/26; it was complicated by heart block prompting pacemaker implantation.  She has had recurrent aFib and  was restarted on amio--and then stopped 2/2 worsening SOB amiodarone resumed and then adjunctive ranolazine  She has a history of prior strokes and remains on oral anticoagulation .  Symptomatic orthostasis; tried an abdominal binder without improvement.   5/18 presented w SOB and facial fullness, found to have SVC syndrome 2/2 NHlymphoma Rx initially with XRT  Now in remission     Date Cr K TSH ALT Hgb  8/18   0.2 15 11.9  9/18                       9.3  3/19   0.722 (12/18) 23 11.2  4/20    18 13.2  11/20 1.22 4.5 0.74 (7/20) 17(7/20) 11.9  3/21 1.06 4.2 0.82 19 12.8         Hospitalized 3/21.  Atrial fibrillation with rapid rates having presented with a fall.  Brain imaging was concerning initially for a possible brain lesion; neurosurgery and MRI was negative.  Amiodarone dose was increased with a reload.  She was discharged for outpatient cardioversion. 12/10/19  She is doing as well as she has some third.  No significant dizziness.  Able to walk with minimal dyspnea.  She has seen a vein specialist and has been diagnosed with lymphedema of her right greater than left leg.  They have talked about pumps.  No bleeding on the Eliquis.  Very costly.  Was not able to tolerate rivaroxaban.  DATE TEST EF   2011 Echo  55 % LAE 40 mm  7/14 Echo  55-60 %   9/18 Echo  55-60%   12/18 Echo  55-60% Severe LAE ?  11/20 TEE 55%  moderate MR/TR mild MS       Remote morbid obesity status>>  post lap band surgery    Thromboembolic risk factors ( age -32 , TIA/CVA-2, Gender-1) for a CHADSVASc Score of 5   Past Medical History:  Diagnosis Date  . Anemia   . Arthritis    osteoarthritis - knees and right shoulder  . Atrial fibrillation [facility])    ablation- 2x's-- 1st time- [facility], 2nd event at [facility] in 2008. Convergent ablation at [facility] 6/14  . Atrial fibrillation ([place])   . Blood transfusion without reported diagnosis   . Breast cancer ([place])    Dr Margot Chimes, total thyroidectomy- 1999- for cancer  . 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 Chronic bilateral pleural effusions   . Colon polyp    Dr Earlean Shawl  . Complete heart block ([place])   . Complication of anesthesia    Ketamine produces LSD reaction, bright colored nightmarish experience   . Dyslipidemia   . Dyspnea   . Endometriosis   . Fibroids   . H/O pleural effusion    s/p thoracentesis w 3279m withdrawn  . Hepatitis    [place] ?hepatitis   . History of dysphagia    due to radiation therapy  . History  of hiatal hernia    small noted on PET scan  . History of kidney stones   . Hx of thyroid cancer    Dr South  . Hyperlipidemia   . Hypertension   . Hypothyroidism   . Lung cancer, lower lobe (HCC) 01/2017   radiation RX completed 03/04/17; will start chemo 6/27, pt unaware of lung cancer  . Morbid obesity (HCC)    Status post lap band surgery  . Nephrolithiasis   . Non Hodgkin's lymphoma (HCC)    on chemotherapy  . Persistent atrial fibrillation (HCC)    a. s/p PVI 2008 b. s/p convergent ablation 2014 complicated by bradycardia requiring pacemaker implant  . Personal history of radiation therapy   . Presence of permanent cardiac pacemaker   . Rotator cuff tear    Right  . Sinus node dysfunction (HCC)    Complicating convergent ablation 6/14  . Stroke (HCC)    2003- [place] x2  . SVC syndrome    with lung mass and non hodgkins lymphoma  . Thyroid cancer  (HCC) 2000    Past Surgical History:  Procedure Laterality Date  . ABDOMINAL HYSTERECTOMY  1983  . afib ablation     a. 2008 PVI b. 2014 convergent ablation  . APPENDECTOMY    . BONE MARROW BIOPSY  02/21/2017  . BREAST LUMPECTOMY Left 2010  . bso  1998  . CARDIAC CATHETERIZATION     2015- negative  . CARDIOVERSION  10/09/2012   Procedure: CARDIOVERSION;  Surgeon: James Hochrein, MD;  [facility]: [facility];  Service: Cardiovascular;  Laterality: N/A;  . CARDIOVERSION  10/09/2012   Procedure: CARDIOVERSION;  Surgeon: James Hochrein, MD;  [facility]: [facility];  Service: Cardiovascular;  Laterality: N/A;  Mike gave the ok to add pt to the add on , but we must check to find out if the can add pt on at [phone] ( [phone])  . CARDIOVERSION N/A 11/20/2012   Procedure: CARDIOVERSION;  Surgeon: Paula V Ross, MD;  [facility]: [facility];  Service: Cardiovascular;  Laterality: N/A;  . CARDIOVERSION N/A 07/18/2017   Procedure: CARDIOVERSION;  Surgeon: Nahser, Philip J, MD;  [facility];  Service: Cardiovascular;  Laterality: N/A;  . CARDIOVERSION N/A 10/03/2017   Procedure: CARDIOVERSION;  Surgeon: Croitoru, Mihai, MD;  [facility];  Service: Cardiovascular;  Laterality: N/A;  . CARDIOVERSION N/A 01/07/2018   Procedure: CARDIOVERSION;  Surgeon: Nahser, Philip J, MD;  [facility];  Service: Cardiovascular;  Laterality: N/A;  . CARDIOVERSION N/A 12/10/2019   Procedure: CARDIOVERSION;  Surgeon: Christopher, Bridgette, MD;  [facility];  Service: Cardiovascular;  Laterality: N/A;  . CHOLECYSTECTOMY    . COLONOSCOPY W/ POLYPECTOMY     Dr Medoff  . CYSTOSCOPY N/A 02/06/2015   Procedure: CYSTOSCOPY;  Surgeon: Mark Ottelin, MD;  Location: [facility];  Service: Urology;  Laterality: N/A;  . CYSTOSCOPY W/ RETROGRADES Left 11/17/2017   Procedure: CYSTOSCOPY WITH RETROGRADE /PYELOGRAM/;  Surgeon: Ottelin, Mark, MD;  Location: [facility];  Service: Urology;  Laterality: Left;  .  CYSTOSCOPY WITH RETROGRADE PYELOGRAM, URETEROSCOPY AND STENT PLACEMENT Right 02/06/2015   Procedure: RETROGRADE PYELOGRAM, RIGHT URETEROSCOPY STENT PLACEMENT;  Surgeon: Mark Ottelin, MD;  Location: [facility];  Service: Urology;  Laterality: Right;  . CYSTOSCOPY WITH RETROGRADE PYELOGRAM, URETEROSCOPY AND STENT PLACEMENT Right 03/07/2017   Procedure: CYSTOSCOPY WITH RIGHT RETROGRADE PYELOGRAM,RIGHT  URETEROSCOPYLASER LITHOTRIPSY  AND STENT PLACEMENT AND STONE BASKETRY;  Surgeon: Ottelin, Mark, MD;  Location: [facility];  Service: Urology;    Laterality: Right;  . EYE SURGERY     cataract surgery  . fatty mass removal  1999   pubic area  . HOLMIUM LASER APPLICATION N/A 0/25/4270   Procedure: HOLMIUM LASER APPLICATION;  Surgeon: Kathie Rhodes, MD;  [facility]: [facility];  Service: Urology;  Laterality: N/A;  . HOLMIUM LASER APPLICATION Right 03/16/7627   Procedure: HOLMIUM LASER APPLICATION;  Surgeon: Kathie Rhodes, MD;  Location[facility];  Service: Urology;  Laterality: Right;  . HOLMIUM LASER APPLICATION Left 12/06/1759   Procedure: HOLMIUM LASER APPLICATION;  Surgeon: Kathie Rhodes, MD;  [facility]: [facility];  Service: Urology;  Laterality: Left;  . IR FLUORO GUIDE PORT INSERTION RIGHT  02/24/2017  . IR NEPHROSTOMY PLACEMENT RIGHT  11/17/2017  . IR PATIENT EVAL TECH 0-60 MINS  03/11/2017  . IR REMOVAL TUN ACCESS W/ PORT W/O FL MOD SED  04/20/2018  . IR US GUIDE VASC ACCESS RIGHT  02/24/2017  . KNEE ARTHROSCOPY     bilateral  . LAPAROSCOPIC GASTRIC BANDING  07/10/2010  . LAPAROSCOPIC GASTRIC BANDING     Laparoscopic adjustable banding APS System with posterior hiatal hernia, 2 suture.  Marland Kitchen LAPAROTOMY     for ruptured ovary and ovarian artery   . NEPHROLITHOTOMY Right 11/17/2017   Procedure: NEPHROLITHOTOMY PERCUTANEOUS;  Surgeon: Kathie Rhodes, MD;  [facility]: [facility];  Service: Urology;  Laterality: Right;  . PACEMAKER INSERTION  03/10/2013   MDT dual chamber PPM  . POCKET REVISION  N/A 12/08/2013   Procedure: POCKET REVISION;  Surgeon: Deboraha Sprang, MD;  Location: [facility] CATH LAB;  Service: Cardiovascular;  Laterality: N/A;  . PORTA CATH INSERTION    . REVERSE SHOULDER ARTHROPLASTY Right 05/14/2018   Procedure: RIGHT REVERSE SHOULDER ARTHROPLASTY;  Surgeon: Tania Ade, MD;  Location: [place];  Service: Orthopedics;  Laterality: Right;  . REVERSE SHOULDER REPLACEMENT Right 05/14/2018  . RIGHT HEART CATH N/A 07/21/2019   Procedure: RIGHT HEART CATH;  Surgeon: Larey Dresser, MD;  Location: [place] CV LAB;  Service: Cardiovascular;  Laterality: N/A;  . TEE WITH CARDIOVERSION  09/22/2017  . TEE WITHOUT CARDIOVERSION N/A 10/03/2017   Procedure: TRANSESOPHAGEAL ECHOCARDIOGRAM (TEE);  Surgeon: Sanda Kavin Weckwerth, MD;  Location: [facility] ENDOSCOPY;  Service: Cardiovascular;  Laterality: N/A;  . TEE WITHOUT CARDIOVERSION N/A 08/04/2019   Procedure: TRANSESOPHAGEAL ECHOCARDIOGRAM (TEE);  Surgeon: Larey Dresser, MD;  Location: [facility] ENDOSCOPY;  Service: Cardiovascular;  Laterality: N/A;  . TEE WITHOUT CARDIOVERSION N/A 12/10/2019   Procedure: TRANSESOPHAGEAL ECHOCARDIOGRAM (TEE);  Surgeon: Buford Dresser, MD;  Location: [facility] ENDOSCOPY;  Service: Cardiovascular;  Laterality: N/A;  . THYROIDECTOMY  1998   Dr Margot Chimes  . TONSILLECTOMY    . TOTAL KNEE ARTHROPLASTY  04/13/2012   Procedure: TOTAL KNEE ARTHROPLASTY;  Surgeon: Rudean Haskell, MD;  Location: [place];  Service: Orthopedics;  Laterality: Right;  Marland Kitchen VIDEO BRONCHOSCOPY WITH ENDOBRONCHIAL ULTRASOUND N/A 02/07/2017   Procedure: VIDEO BRONCHOSCOPY WITH ENDOBRONCHIAL ULTRASOUND;  Surgeon: Marshell Garfinkel, MD;  Location: [place];  Service: Pulmonary;  Laterality: N/A;    Current Outpatient Medications  Medication Sig Dispense Refill  . acetaminophen (TYLENOL) 500 MG tablet Take 500-1,000 mg by mouth every 6 (six) hours as needed for mild pain or headache.    Marland Kitchen amiodarone (PACERONE) 200 MG tablet Take 1 tablet (200 mg total) by mouth  daily. 90 tablet 3  . calcium-vitamin D (OSCAL WITH D) 500-200 MG-UNIT tablet Take 2 tablets by mouth daily with breakfast. 180 tablet 1  . ELIQUIS 5 MG  TABS tablet TAKE 1 TABLET BY MOUTH  TWICE DAILY 180 tablet 2  . furosemide (LASIX) 20 MG tablet Take 20 mg by mouth daily as needed for fluid or edema.     Marland Kitchen levothyroxine (SYNTHROID) 137 MCG tablet Take 137 mcg by mouth See admin instructions. TAKE 1 TABLET (137 MCG) BY MOUTH DAILY, EXCEPT SATURDAYS    . Lifitegrast (XIIDRA) 5 % SOLN Place 1 drop into both eyes at bedtime.     . Multiple Vitamins-Minerals (ONE-A-DAY WOMENS 50+ ADVANTAGE) TABS Take 1 tablet by mouth daily with breakfast.    . rosuvastatin (CRESTOR) 10 MG tablet Take 10 mg by mouth 2 (two) times a week. Takes on Monday and Thursday    . senna (SENOKOT) 8.6 MG tablet Take 1 tablet by mouth as needed for constipation.     Current Facility-Administered Medications  Medication Dose Route Frequency Provider Last Rate Last Admin  . sodium chloride flush (NS) 0.9 % injection 3 mL  3 mL Intravenous Q12H Deboraha Sprang, MD        Allergies  Allergen Reactions  . Dofetilide Other (See Comments)    Prolonged QT interval   . Ranolazine     Balance issues  . Rivaroxaban Other (See Comments)    Nose Bleed X 6 hrs ; packing in ER Nasal hemorrhage  . Rivaroxaban     Nose Bleed X 6 hrs ; packing in ER Nasal hemorrhage  . Tape Other (See Comments)    SKIN IS THIN AND TEARS EASILY   . Tikosyn [Dofetilide] Other (See Comments)    "Long QT wave"   . Epinephrine Other (See Comments)    Oral anesthetic Dental form only (liquid). Patient stated she will become "out of it" she can hear you but cannot respond in normal fashion. "not fully with it"  . Epinephrine   . Ketamine Other (See Comments)    Hallucinations   . Adhesive [Tape] Rash    Paper tape as well  . Ketamine Other (See Comments)    HALLUCINATIONS    Review of Systems negative except from HPI and PMH  Physical  Exam BP 116/68   Pulse 70   Ht 5' 6" (1.676 m)   Wt 209 lb 9.6 oz (95.1 kg)   SpO2 99%   BMI 33.83 kg/m  Well developed and Morbidly obese  in no acute distress HENT normal Neck supple with JVP-flat Clear Device pocket well healed; without hematoma or erythema.  There is no tethering  Regular rate and rhythm, no  * murmur Abd-soft with active BS No Clubbing cyanosis  Edema with right greater than leg swelling Skin-warm and dry A & Oriented  Grossly normal sensory and motor function  ECG atrial paced at 78 Interval 23/10/39    Assessment and  Plan  Atrial fibrillation/flutter  S/p convergent ablation persistent  Prior Strokes  Pacemaker Medtronic MRI compatible   Non-Hodgkin's lymphoma on chemotherapy  Dyspnea on exertion     Treated hypothyroidism  High Risk Medication Surveillance-amiodarone  Junctional rhythm   Orthostatic hypotension     No interval afib of nte ( Atrial rate 180 s dur 4 h)  Tolerating amio willneed labs   On Anticoagulation;  No bleeding issues   Orthostasis much improved.  Device interrogation demonstrates "no activity "and this relates to our having made her threshold considerably higher and with this attenuated her symptoms.  Please see last office note

## 2020-08-16 NOTE — Patient Instructions (Addendum)
° °  Medication Instructions:  Your physician recommends that you continue on your current medications as directed. Please refer to the Current Medication list given to you today.  *If you need a refill on your cardiac medications before your next appointment, please call your pharmacy*  Labs: Today: CMET, TSH  Follow-Up: At [facility], you and your health needs are our priority.  As part of our continuing mission to provide you with exceptional heart care, we have created designated Provider Care Teams.  These Care Teams include your primary Cardiologist (physician) and Advanced Practice Providers (APPs -  Physician Assistants and Nurse Practitioners) who all work together to provide you with the care you need, when you need it.  We recommend signing up for the patient portal called "MyChart".  Sign up information is provided on this After Visit Summary.  MyChart is used to connect with patients for Virtual Visits (Telemedicine).  Patients are able to view lab/test results, encounter notes, upcoming appointments, etc.  Non-urgent messages can be sent to your provider as well.   To learn more about what you can do with MyChart, go to [email or web address].    Your next appointment:   1 year(s)  The format for your next appointment:   In Person  Provider:   Dr. Caryl Comes  Remote monitoring is used to monitor your Pacemaker from home. This monitoring reduces the number of office visits required to check your device to one time per year. It allows [place] to keep an eye on the functioning of your device to ensure it is working properly. You are scheduled for a device check from home on December 13th, 2021. You may send your transmission at any time that day. If you have a wireless device, the transmission will be sent automatically. After your physician reviews your transmission, you will receive a postcard with your next transmission date.

## 2020-08-17 LAB — COMPREHENSIVE METABOLIC PANEL
ALT: 23 IU/L (ref 0–32)
AST: 32 IU/L (ref 0–40)
Albumin/Globulin Ratio: 1.7 (ref 1.2–2.2)
Albumin: 4.1 g/dL (ref 3.7–4.7)
Alkaline Phosphatase: 98 IU/L (ref 44–121)
BUN/Creatinine Ratio: 23 (ref 12–28)
BUN: 27 mg/dL (ref 8–27)
Bilirubin Total: 0.5 mg/dL (ref 0.0–1.2)
CO2: 22 mmol/L (ref 20–29)
Calcium: 9.6 mg/dL (ref 8.7–10.3)
Chloride: 103 mmol/L (ref 96–106)
Creatinine, Ser: 1.16 mg/dL — ABNORMAL HIGH (ref 0.57–1.00)
GFR calc Af Amer: 53 mL/min/{1.73_m2} — ABNORMAL LOW (ref >59)
GFR calc non Af Amer: 46 mL/min/{1.73_m2} — ABNORMAL LOW (ref >59)
Globulin, Total: 2.4 g/dL (ref 1.5–4.5)
Glucose: 97 mg/dL (ref 65–99)
Potassium: 4.8 mmol/L (ref 3.5–5.2)
Sodium: 141 mmol/L (ref 134–144)
Total Protein: 6.5 g/dL (ref 6.0–8.5)

## 2020-08-17 LAB — TSH: TSH: 1.21 u[IU]/mL (ref 0.450–4.500)

## 2020-08-21 ENCOUNTER — Ambulatory Visit (HOSPITAL_COMMUNITY)
Admission: RE | Admit: 2020-08-21 | Discharge: 2020-08-21 | Disposition: A | Discharge status: Home / Self Care | Payer: Medicare Other | Source: Ambulatory Visit | Attending: Hematology and Oncology | Admitting: Hematology and Oncology

## 2020-08-21 ENCOUNTER — Inpatient Hospital Stay: Payer: Medicare Other | Attending: Hematology and Oncology

## 2020-08-21 ENCOUNTER — Other Ambulatory Visit: Payer: Self-pay

## 2020-08-21 DIAGNOSIS — I728 Aneurysm of other specified arteries: Secondary | ICD-10-CM | POA: Diagnosis not present

## 2020-08-21 DIAGNOSIS — I2510 Atherosclerotic heart disease of native coronary artery without angina pectoris: Secondary | ICD-10-CM | POA: Diagnosis not present

## 2020-08-21 DIAGNOSIS — C8522 Mediastinal (thymic) large B-cell lymphoma, intrathoracic lymph nodes: Secondary | ICD-10-CM | POA: Diagnosis not present

## 2020-08-21 DIAGNOSIS — J90 Pleural effusion, not elsewhere classified: Secondary | ICD-10-CM | POA: Diagnosis not present

## 2020-08-21 DIAGNOSIS — I4819 Other persistent atrial fibrillation: Secondary | ICD-10-CM | POA: Diagnosis not present

## 2020-08-21 DIAGNOSIS — C8590 Non-Hodgkin lymphoma, unspecified, unspecified site: Secondary | ICD-10-CM | POA: Diagnosis not present

## 2020-08-21 LAB — CBC WITH DIFFERENTIAL (CANCER CENTER ONLY)
Abs Immature Granulocytes: 0.02 10*3/uL (ref 0.00–0.07)
Basophils Absolute: 0.1 10*3/uL (ref 0.0–0.1)
Basophils Relative: 1 %
Eosinophils Absolute: 0.1 10*3/uL (ref 0.0–0.5)
Eosinophils Relative: 2 %
HCT: 40.7 % (ref 36.0–46.0)
Hemoglobin: 13 g/dL (ref 12.0–15.0)
Immature Granulocytes: 0 %
Lymphocytes Relative: 12 %
Lymphs Abs: 0.7 10*3/uL (ref 0.7–4.0)
MCH: 30.1 pg (ref 26.0–34.0)
MCHC: 31.9 g/dL (ref 30.0–36.0)
MCV: 94.2 fL (ref 80.0–100.0)
Monocytes Absolute: 0.7 10*3/uL (ref 0.1–1.0)
Monocytes Relative: 11 %
Neutro Abs: 4.3 10*3/uL (ref 1.7–7.7)
Neutrophils Relative %: 74 %
Platelet Count: 177 10*3/uL (ref 150–400)
RBC: 4.32 MIL/uL (ref 3.87–5.11)
RDW: 14.8 % (ref 11.5–15.5)
WBC Count: 5.9 10*3/uL (ref 4.0–10.5)
nRBC: 0 % (ref 0.0–0.2)

## 2020-08-21 LAB — CMP (CANCER CENTER ONLY)
ALT: 20 U/L (ref 0–44)
AST: 23 U/L (ref 15–41)
Albumin: 3.5 g/dL (ref 3.5–5.0)
Alkaline Phosphatase: 85 U/L (ref 38–126)
Anion gap: 9 (ref 5–15)
BUN: 18 mg/dL (ref 8–23)
CO2: 25 mmol/L (ref 22–32)
Calcium: 9.6 mg/dL (ref 8.9–10.3)
Chloride: 110 mmol/L (ref 98–111)
Creatinine: 1.1 mg/dL — ABNORMAL HIGH (ref 0.44–1.00)
GFR, Estimated: 52 mL/min — ABNORMAL LOW (ref >60)
Glucose, Bld: 97 mg/dL (ref 70–99)
Potassium: 4.7 mmol/L (ref 3.5–5.1)
Sodium: 144 mmol/L (ref 135–145)
Total Bilirubin: 0.8 mg/dL (ref 0.3–1.2)
Total Protein: 6.7 g/dL (ref 6.5–8.1)

## 2020-08-21 LAB — LACTATE DEHYDROGENASE: LDH: 219 U/L — ABNORMAL HIGH (ref 98–192)

## 2020-08-21 MED ORDER — IOHEXOL 300 MG/ML  SOLN
75.0000 mL | Freq: Once | INTRAMUSCULAR | Status: AC | PRN
  Administered 2020-08-21: 75 mL via INTRAVENOUS

## 2020-08-23 NOTE — Progress Notes (Signed)
Patient Care Team: [name], MD as PCP - General (Endocrinology) Deboraha Sprang, MD as PCP - Cardiology (Cardiology) [name], MD (Endocrinology)  DIAGNOSIS:    ICD-10-CM   1. 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)  C85.22     SUMMARY OF ONCOLOGIC HISTORY: Oncology History  Non-Hodgkin lymphoma, unspecified, intrathoracic lymph nodes ([place])  02/03/2017 Imaging   Large superior mediastinal mass contiguous with right hilar region encasing the trachea and right mainstem bronchus measuring 5.4 x 7.5 x 6 cm causing SVC obstruction, additional mediastinal lymph nodes subcarinal 1.6 cm and left hilar 1 cm; multiple small pulmonary nodules 4-6 mm    02/11/2017 Initial Diagnosis   Flow cytometry of mediastinal mass revealed monoclonal population of B cells with expression of CD10 consistent with non-Hodgkin B-cell lymphoma germinal center operation; bone marrow biopsy negative for lymphoma   04/02/2017 - 07/16/2017 Chemotherapy   R-CHOP X 6 cycles      CHIEF COMPLIANT: Follow-up of lymphoma  INTERVAL HISTORY: Kayla Harrison is a 74 y.o. with above-mentioned history of lymphoma treated with 6 cycles of R-CHOP. Chest CT on 08/21/20 showed no new or progressive findings, stable treatment related changes in the right mediastinum, right hilum and paramediastinal lungs, and a stable tiny right pleural effusion. She presents to the clinic today for follow-up to review her recent scan.  She had a remarkable improvement in her leg strength and energy levels since April 2021.  Is unclear why that change so dramatically but she is very happy about that.  She continues to have peripheral neuropathy which appears to be getting worse.  ALLERGIES:  is allergic to dofetilide, ranolazine, rivaroxaban, rivaroxaban, tape, tikosyn [dofetilide], epinephrine, epinephrine, ketamine, adhesive [tape], and ketamine.  MEDICATIONS:  Current Outpatient Medications   Medication Sig Dispense Refill  . acetaminophen (TYLENOL) 500 MG tablet Take 500-1,000 mg by mouth every 6 (six) hours as needed for mild pain or headache.    Marland Kitchen amiodarone (PACERONE) 200 MG tablet Take 1 tablet (200 mg total) by mouth daily. 90 tablet 3  . calcium-vitamin D (OSCAL WITH D) 500-200 MG-UNIT tablet Take 2 tablets by mouth daily with breakfast. 180 tablet 1  . ELIQUIS 5 MG TABS tablet TAKE 1 TABLET BY MOUTH  TWICE DAILY 180 tablet 2  . furosemide (LASIX) 20 MG tablet Take 20 mg by mouth daily as needed for fluid or edema.     Marland Kitchen levothyroxine (SYNTHROID) 137 MCG tablet Take 137 mcg by mouth See admin instructions. TAKE 1 TABLET (137 MCG) BY MOUTH DAILY, EXCEPT SATURDAYS    . Lifitegrast (XIIDRA) 5 % SOLN Place 1 drop into both eyes at bedtime.     . Multiple Vitamins-Minerals (ONE-A-DAY WOMENS 50+ ADVANTAGE) TABS Take 1 tablet by mouth daily with breakfast.    . rosuvastatin (CRESTOR) 10 MG tablet Take 10 mg by mouth 2 (two) times a week. Takes on Monday and Thursday    . senna (SENOKOT) 8.6 MG tablet Take 1 tablet by mouth as needed for constipation.     Current Facility-Administered Medications  Medication Dose Route Frequency Provider Last Rate Last Admin  . sodium chloride flush (NS) 0.9 % injection 3 mL  3 mL Intravenous Q12H Deboraha Sprang, MD        PHYSICAL EXAMINATION: ECOG PERFORMANCE STATUS: 1 - Symptomatic but completely ambulatory  Vitals:   08/24/20 1429  BP: 119/82  Pulse: 71  Resp: 16  Temp: (!) 97 F (36.1 C)  SpO2:  99%   Filed Weights   08/24/20 1429  Weight: 210 lb 12.8 oz (95.6 kg)    LABORATORY DATA:  I have reviewed the data as listed CMP Latest Ref Rng & Units 08/21/2020 08/16/2020 12/04/2019  Glucose 70 - 99 mg/dL 97 97 110(H)  BUN 8 - 23 mg/dL $Remove'18 27 17  'JridoTq$ Creatinine 0.44 - 1.00 mg/dL 1.10(H) 1.16(H) 1.06(H)  Sodium 135 - 145 mmol/L 144 141 140  Potassium 3.5 - 5.1 mmol/L 4.7 4.8 4.2  Chloride 98 - 111 mmol/L 110 103 106  CO2 22 - 32  mmol/L $Remov'25 22 25  'fOTIoP$ Calcium 8.9 - 10.3 mg/dL 9.6 9.6 8.6(L)  Total Protein 6.5 - 8.1 g/dL 6.7 6.5 -  Total Bilirubin 0.3 - 1.2 mg/dL 0.8 0.5 -  Alkaline Phos 38 - 126 U/L 85 98 -  AST 15 - 41 U/L 23 32 -  ALT 0 - 44 U/L 20 23 -    Lab Results  Component Value Date   WBC 5.9 08/21/2020   HGB 13.0 08/21/2020   HCT 40.7 08/21/2020   MCV 94.2 08/21/2020   PLT 177 08/21/2020   NEUTROABS 4.3 08/21/2020    ASSESSMENT & PLAN:  Non-Hodgkin lymphoma, unspecified, intrathoracic lymph nodes ([place]) 02/03/2017: Large superior mediastinal mass contiguous with right hilar region encasing the trachea and right mainstem bronchus measuring 5.4 x 7.5 x 6 cm causing SVC obstruction, additional mediastinal lymph nodes subcarinal 1.6 cm and left hilar 1 cm; multiple small pulmonary nodules 4-6 mm   Bronchoscopy and biopsy 02/09/2017: Monoclonal B-cell population expressing CD10 consistent with non-Hodgkin's B-cell lymphoma germinal center origin Bone marrow biopsy 02/21/2017: No evidence of lymphoma PET/CT scan 48/09/6551: Hypermetabolic right paratracheal nodal mass  R CHOP x6 cycles completed 07/16/2017 ---------------------------------------------------------------------- Atrial flutter: Cardioversion by Dr. Acie Fredrickson 07/18/2017 PET/CT scan 08/27/2017:Hypermetabolic right paratracheal nodal mass February 2019: Renal stone status post nephrostomy  CT chest abdomen pelvis 02/17/2018: New patchy consolidation right lower lobe infection/inflammation, postradiation changes in the mediastinum. No evidence of lymphoma recurrence  Lung infiltrates: CT CAP 08/21/2020:Stable exam, small lung nodule stable, splenic aneurysm stable Remote improvement in her level since April 2021. Chemo-induced peripheral neuropathy: Continues to progress slowly get worse.  I discussed with her about using CBD oil.  There is no evidence of recurrence of lymphoma. I discussed with her based upon the new guidelines she does not  need annual CT scans anymore. Return to clinic in 1 year with labs and follow-up.    No orders of the defined types were placed in this encounter.  The patient has a good understanding of the overall plan. she agrees with it. she will call with any problems that may develop before the next visit here.  Total time spent: 20 mins including face to face time and time spent for planning, charting and coordination of care  Nicholas Lose, MD 08/24/2020  I, Cloyde Reams Dorshimer, am acting as scribe for Dr. Nicholas Lose.  I have reviewed the above documentation for accuracy and completeness, and I agree with the above.

## 2020-08-23 NOTE — Assessment & Plan Note (Signed)
02/03/2017: Large superior mediastinal mass contiguous with right hilar region encasing the trachea and right mainstem bronchus measuring 5.4 x 7.5 x 6 cm causing SVC obstruction, additional mediastinal lymph nodes subcarinal 1.6 cm and left hilar 1 cm; multiple small pulmonary nodules 4-6 mm   Bronchoscopy and biopsy 02/09/2017: Monoclonal B-cell population expressing CD10 consistent with non-Hodgkin's B-cell lymphoma germinal center origin Bone marrow biopsy 02/21/2017: No evidence of lymphoma PET/CT scan 04/13/8287: Hypermetabolic right paratracheal nodal mass  R CHOP x6 cycles completed 07/16/2017 ---------------------------------------------------------------------- Atrial flutter: Cardioversion by Dr. Acie Fredrickson 07/18/2017 PET/CT scan 08/27/2017:Hypermetabolic right paratracheal nodal mass February 2019: Renal stone status post nephrostomy  CT chest abdomen pelvis 02/17/2018: New patchy consolidation right lower lobe infection/inflammation, postradiation changes in the mediastinum. No evidence of lymphoma recurrence  Lung infiltrates: CT CAP 08/21/2020:Stable exam  There is no evidence of recurrence of lymphoma. Return to clinic in 1 year with labs scans and follow-up.

## 2020-08-24 ENCOUNTER — Inpatient Hospital Stay: Payer: Medicare Other | Attending: Hematology and Oncology | Admitting: Hematology and Oncology

## 2020-08-24 ENCOUNTER — Other Ambulatory Visit: Payer: Self-pay

## 2020-08-24 ENCOUNTER — Telehealth: Payer: Self-pay | Admitting: Hematology and Oncology

## 2020-08-24 DIAGNOSIS — C8522 Mediastinal (thymic) large B-cell lymphoma, intrathoracic lymph nodes: Secondary | ICD-10-CM | POA: Diagnosis not present

## 2020-08-24 DIAGNOSIS — Z888 Allergy status to other drugs, medicaments and biological substances status: Secondary | ICD-10-CM | POA: Insufficient documentation

## 2020-08-24 DIAGNOSIS — T451X5A Adverse effect of antineoplastic and immunosuppressive drugs, initial encounter: Secondary | ICD-10-CM | POA: Diagnosis not present

## 2020-08-24 DIAGNOSIS — N200 Calculus of kidney: Secondary | ICD-10-CM | POA: Diagnosis not present

## 2020-08-24 DIAGNOSIS — G620 Drug-induced polyneuropathy: Secondary | ICD-10-CM | POA: Diagnosis not present

## 2020-08-24 DIAGNOSIS — I871 Compression of vein: Secondary | ICD-10-CM | POA: Diagnosis not present

## 2020-08-24 DIAGNOSIS — R918 Other nonspecific abnormal finding of lung field: Secondary | ICD-10-CM | POA: Insufficient documentation

## 2020-08-24 DIAGNOSIS — I4892 Unspecified atrial flutter: Secondary | ICD-10-CM | POA: Insufficient documentation

## 2020-08-24 DIAGNOSIS — Z936 Other artificial openings of urinary tract status: Secondary | ICD-10-CM | POA: Insufficient documentation

## 2020-08-24 DIAGNOSIS — Z79899 Other long term (current) drug therapy: Secondary | ICD-10-CM | POA: Insufficient documentation

## 2020-08-24 DIAGNOSIS — Z7901 Long term (current) use of anticoagulants: Secondary | ICD-10-CM | POA: Diagnosis not present

## 2020-08-24 NOTE — Telephone Encounter (Signed)
Scheduled appts per 12/2 los. Gave pt a print out of AVS and appt calendar.

## 2020-09-04 ENCOUNTER — Ambulatory Visit (INDEPENDENT_AMBULATORY_CARE_PROVIDER_SITE_OTHER): Payer: Medicare Other

## 2020-09-04 DIAGNOSIS — I4819 Other persistent atrial fibrillation: Secondary | ICD-10-CM | POA: Diagnosis not present

## 2020-09-05 LAB — CUP PACEART REMOTE DEVICE CHECK
Battery Remaining Longevity: 36 mo
Battery Voltage: 2.96 V
Brady Statistic AP VP Percent: 3.07 %
Brady Statistic AP VS Percent: 76.41 %
Brady Statistic AS VP Percent: 1.35 %
Brady Statistic AS VS Percent: 19.17 %
Brady Statistic RA Percent Paced: 79.06 %
Brady Statistic RV Percent Paced: 4.44 %
Date Time Interrogation Session: 20211213154518
Implantable Lead Implant Date: 20140618
Implantable Lead Implant Date: 20140618
Implantable Lead Location: 753859
Implantable Lead Location: 753860
Implantable Pulse Generator Implant Date: 20140618
Lead Channel Impedance Value: 1007 Ohm
Lead Channel Impedance Value: 342 Ohm
Lead Channel Impedance Value: 437 Ohm
Lead Channel Impedance Value: 950 Ohm
Lead Channel Pacing Threshold Amplitude: 0.875 V
Lead Channel Pacing Threshold Amplitude: 1.125 V
Lead Channel Pacing Threshold Pulse Width: 0.4 ms
Lead Channel Pacing Threshold Pulse Width: 0.4 ms
Lead Channel Sensing Intrinsic Amplitude: 0.875 mV
Lead Channel Sensing Intrinsic Amplitude: 0.875 mV
Lead Channel Sensing Intrinsic Amplitude: 8.875 mV
Lead Channel Sensing Intrinsic Amplitude: 8.875 mV
Lead Channel Setting Pacing Amplitude: 2.5 V
Lead Channel Setting Pacing Amplitude: 2.5 V
Lead Channel Setting Pacing Pulse Width: 0.4 ms
Lead Channel Setting Sensing Sensitivity: 0.9 mV

## 2020-09-18 NOTE — Progress Notes (Signed)
Remote pacemaker transmission.   

## 2020-09-24 ENCOUNTER — Telehealth: Payer: Self-pay | Admitting: Pulmonary Disease

## 2020-09-24 NOTE — Telephone Encounter (Signed)
Patient called answering service, tested Covid positive Significant symptoms yesterday, better today Vaccinated  Feels better today compared with yesterday Cough, shortness of breath, saturations 92%  Over-the-counter cough/sinus medication recommended  Call office/primary doctor if she does not continue to feel better

## 2020-09-27 ENCOUNTER — Telehealth: Payer: Self-pay | Admitting: Pulmonary Disease

## 2020-09-27 MED ORDER — CEFDINIR 300 MG PO CAPS: 300.0000 mg | ORAL_CAPSULE | Freq: Two times a day (BID) | ORAL | 0 refills | Status: DC

## 2020-09-27 NOTE — Telephone Encounter (Signed)
Discussed with patient CPAP care and cleaning.  Advised to change CPAP supplies once recovery from COVID-19.  Patient says she continues to have ongoing cough congestion with thick mucus and discoloration.  She has been checking her oxygen levels and has normal O2 sats greater than 90%.  She denies any chest pain hemoptysis or fever.  Advised on red flag symptoms such as severe nausea vomiting high fevers, hypoxia, chest pain, calf pain to report immediately or seek emergency room care.  Patient has been received all 3 Covid vaccines. We will call in Omnicef 300 mg twice daily for 5 days for secondary bronchitis.  Advised to continue on Mucinex and supportive care.  Fluids and rest. We will have her follow-up in office in 2 weeks office visit May.  Please contact office for sooner follow up if symptoms do not improve or worsen or seek emergency care   FYI to Dr. Halford Chessman

## 2020-09-27 NOTE — Telephone Encounter (Signed)
Disregard

## 2020-10-12 ENCOUNTER — Other Ambulatory Visit: Payer: Self-pay

## 2020-10-12 ENCOUNTER — Ambulatory Visit (INDEPENDENT_AMBULATORY_CARE_PROVIDER_SITE_OTHER): Payer: Medicare Other

## 2020-10-12 ENCOUNTER — Encounter: Payer: Self-pay | Admitting: Adult Health

## 2020-10-12 ENCOUNTER — Ambulatory Visit (INDEPENDENT_AMBULATORY_CARE_PROVIDER_SITE_OTHER): Payer: Medicare Other | Admitting: Adult Health

## 2020-10-12 VITALS — BP 118/70 | HR 71 | Temp 97.0°F | Ht 66.0 in | Wt 207.4 lb

## 2020-10-12 DIAGNOSIS — G4733 Obstructive sleep apnea (adult) (pediatric): Secondary | ICD-10-CM

## 2020-10-12 DIAGNOSIS — R059 Cough, unspecified: Secondary | ICD-10-CM | POA: Diagnosis not present

## 2020-10-12 DIAGNOSIS — U071 COVID-19: Secondary | ICD-10-CM | POA: Diagnosis not present

## 2020-10-12 MED ORDER — BENZONATATE 100 MG PO CAPS: 100.0000 mg | ORAL_CAPSULE | Freq: Four times a day (QID) | ORAL | 1 refills | Status: DC | PRN

## 2020-10-12 NOTE — Assessment & Plan Note (Signed)
Clinically patient is improving from COVID-19.  Chest x-ray today shows no sign of acute pneumonia. Most likely has a postviral cough.  Have advised her to use Mucinex DM and Tessalon.  Can add in saline nasal spray for postnasal drainage.  Did discuss a short course of steroids however patient declines at this time.  Plan  Patient Instructions  Mucinex DM Twice daily  As needed  Cough/congestion .  Tessalon Three times a day  As needed  Cough  Saline nasal spray Twice daily   Saline nasal gel At bedtime   Continue on CPAP At bedtime   Call if you change your mind on Dream Wear full mask. (Can look at pricing on [email or web address])  I will call with Chest xray results.  Follow up with Dr. Halford Chessman  In 3 months and As needed   Please contact office for sooner follow up if symptoms do not improve or worsen or seek emergency care

## 2020-10-12 NOTE — Progress Notes (Signed)
@Patient  ID: Kayla Harrison, female    DOB: [birth year], 75 y.o.   MRN: [id number]  No chief complaint on file.   Referring provider: [name], MD  HPI: 75 year old female followed for active sleep apnea, dyspnea, pulmonary hypertension Medical history significant for non-Hodgkin's lymphoma status post R-CHOP, diastolic heart disease History of A. fib on amiodarone and Eliquis  TEST/EVENTS :  PFT 01/20/13 >> FEV1 2.97 (118%), FEV1% 86, TLC 5.91 (110%), DLCO 82% PFT 05/12/19 >> FEV1 2.68 (114%), FEV1% 86, TLC 4.55 (85%), DLCO 61%  Chest imaging:  CT angio chest 09/15/17 >> b/l pleural effusions, radiation fibrosis RUL CT chest 528/19 >>stable 6 mm LLL nodule, patchy nodular consolidation RLL, stable 3 mm RUL nodule, stable Rt paramediastinal post radiation changes. CT chest 05/29/18 >> XRT changes paramediastium, decreased size of nodular cluster RLL, tiny Rt effusion CT chest 04/02/19 >> mild CM, atherosclerosis, s/p thyroidectomy, paramediastinal XRT changes b/l Rt > Lt, nodular cluster RLL decreased in size, small Rt effusion CT chest 08/13/19 >> stable cluster RLL, decreased Rt effusion  Sleep testing:  HST 09/09/19 >> AHI 18.5, SpO2 low 76%  Cardiac tests:  [place] 06/16/19 >> EF 50 to 55%, mod RV enlargement, mild MR, mild MS, mod TR, mod elevation in PASP, RVSP 45.5 mmHg RHC 07/21/19 >> PA mean 6, RV 57/7, PA 58/23 mean 40, PCWP 23, prominent V wave, CI 2.51, PVR 3.25 WU  10/12/2020 Follow up ; Covid 19 , Cough  Patient presents for a follow-up visit.   She complains that she has had ongoing cough for the last 3 weeks.  She was diagnosed with COVID at [facility].  She complains that she developed a cough, that has been ongoing since she got diagnosed with COVID-19.  Says she feels like her mucus is getting lighter in color.  She denies any hemoptysis, chest pain, orthopnea, edema or n/v.d. . No loss of taste or smell.  Was called in [place] on 1/5 due to increased cough  and congestion . Tessalon helps cough but she is out of rx. Feels she is getting better but frustrated at ongoing symptoms and slow process. Still has fatigue .  Patient has been fully vaccinated. Chest x-ray today shows no acute cardiopulmonary process.   Patient has underlying obstructive sleep apnea is on nocturnal CPAP.  Says she is doing well but complains of postnasal drainage.  And feels that her mask is tight at times.  We discussed different mask options.  But she wants to hold off for a while.   Allergies  Allergen Reactions  . Dofetilide Other (See Comments)    Prolonged QT interval   . Ranolazine     Balance issues  . Rivaroxaban Other (See Comments)    Nose Bleed X 6 hrs ; packing in ER Nasal hemorrhage  . Rivaroxaban     Nose Bleed X 6 hrs ; packing in ER Nasal hemorrhage  . Tape Other (See Comments)    SKIN IS THIN AND TEARS EASILY   . Tikosyn [Dofetilide] Other (See Comments)    "Long QT wave"   . Epinephrine Other (See Comments)    Oral anesthetic Dental form only (liquid). Patient stated she will become "out of it" she can hear you but cannot respond in normal fashion. "not fully with it"  . Epinephrine   . Ketamine Other (See Comments)    Hallucinations   . Adhesive [Tape] Rash    Paper tape as well  . Ketamine Other (See  Comments)    HALLUCINATIONS    Immunization History  Administered Date(s) Administered  . Fluad Quad(high Dose 65+) 07/03/2020  . Influenza Split 07/06/2017  . Influenza Whole 06/23/2012  . Influenza, High Dose Seasonal PF 06/27/2015, 05/25/2018  . Influenza,inj,Quad PF,6+ Mos 07/09/2014, 07/30/2016  . Influenza-Unspecified 05/24/2013, 06/10/2019  . PFIZER(Purple Top)SARS-COV-2 Vaccination 10/02/2019, 10/23/2019, 07/03/2020  . Pneumococcal Conjugate-13 06/01/2015  . Pneumococcal Polysaccharide-23 06/14/2010  . Td 10/22/2015  . Tdap 09/08/2006    Past Medical History:  Diagnosis Date  . Anemia   . Arthritis     osteoarthritis - knees and right shoulder  . Atrial fibrillation [facility])    ablation- 2x's-- 1st time- [facility], 2nd event at [facility] in 2008. Convergent ablation at [facility] 6/14  . Atrial fibrillation ([place])   . Blood transfusion without reported diagnosis   . Breast cancer ([place])    Dr Margot Chimes, total thyroidectomy- 1999- for cancer  . 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 Chronic bilateral pleural effusions   . Colon polyp    Dr Earlean Shawl  . Complete heart block ([place])   . Complication of anesthesia    Ketamine produces LSD reaction, bright colored nightmarish experience   . Dyslipidemia   . Dyspnea   . Endometriosis   . Fibroids   . H/O pleural effusion    s/p thoracentesis w 3276ml withdrawn  . Hepatitis    [place] ?hepatitis   . History of dysphagia    due to radiation therapy  . History of hiatal hernia    small noted on PET scan  . History of kidney stones   . Hx of thyroid cancer    Dr Forde Dandy  . Hyperlipidemia   . Hypertension   . Hypothyroidism   . Lung cancer, lower lobe ([place]) 01/2017   radiation RX completed 03/04/17; will start chemo 6/27, pt unaware of lung cancer  . Morbid obesity ([place])    Status post lap band surgery  . Nephrolithiasis   . Non Hodgkin's lymphoma ([place])    on chemotherapy  . Persistent atrial fibrillation ([place])    a. s/p PVI 2008 b. s/p convergent ablation 8295 complicated by bradycardia requiring pacemaker implant  . Personal history of radiation therapy   . Presence of permanent cardiac pacemaker   . Rotator cuff tear    Right  . Sinus node dysfunction ([place])    Complicating convergent ablation 6/14  . Stroke [facility])    2003- [place] x2  . SVC syndrome    with lung mass and non hodgkins lymphoma  . Thyroid cancer ([place]) 2000    Tobacco History: Social History   Tobacco Use  Smoking Status Never Smoker  Smokeless Tobacco Never Used   Counseling given: Not Answered   Outpatient Medications Prior to Visit  Medication Sig  Dispense Refill  . acetaminophen (TYLENOL) 500 MG tablet Take 500-1,000 mg by mouth every 6 (six) hours as needed for mild pain or headache.    Marland Kitchen amiodarone (PACERONE) 200 MG tablet Take 1 tablet (200 mg total) by mouth daily. 90 tablet 3  . ELIQUIS 5 MG TABS tablet TAKE 1 TABLET BY MOUTH  TWICE DAILY 180 tablet 2  . furosemide (LASIX) 20 MG tablet Take 20 mg by mouth daily as needed for fluid or edema.     Marland Kitchen levothyroxine (SYNTHROID) 137 MCG tablet Take 137 mcg by mouth See admin instructions. TAKE 1 TABLET (137 MCG) BY MOUTH DAILY, EXCEPT SATURDAYS    . Lifitegrast  5 % SOLN Place 1 drop into both eyes at bedtime.     . Multiple Vitamins-Minerals (ONE-A-DAY WOMENS 50+ ADVANTAGE) TABS Take 1 tablet by mouth daily with breakfast.    . rosuvastatin (CRESTOR) 10 MG tablet Take 10 mg by mouth 2 (two) times a week. Takes on Monday and Thursday    . senna (SENOKOT) 8.6 MG tablet Take 1 tablet by mouth as needed for constipation.    . calcium-vitamin D (OSCAL WITH D) 500-200 MG-UNIT tablet Take 2 tablets by mouth daily with breakfast. (Patient not taking: Reported on 10/12/2020) 180 tablet 1  . cefdinir (OMNICEF) 300 MG capsule Take 1 capsule (300 mg total) by mouth 2 (two) times daily. (Patient not taking: Reported on 10/12/2020) 10 capsule 0   Facility-Administered Medications Prior to Visit  Medication Dose Route Frequency Provider Last Rate Last Admin  . sodium chloride flush (NS) 0.9 % injection 3 mL  3 mL Intravenous Q12H Deboraha Sprang, MD         Review of Systems:   Constitutional:   No  weight loss, night sweats,  Fevers, chills,  +fatigue, or  lassitude.  HEENT:   No headaches,  Difficulty swallowing,  Tooth/dental problems, or  Sore throat,                No sneezing, itching, ear ache,  +nasal congestion, post nasal drip,   CV:  No chest pain,  Orthopnea, PND, swelling in lower extremities, anasarca, dizziness, palpitations, syncope.   GI  No heartburn, indigestion, abdominal pain,  nausea, vomiting, diarrhea, change in bowel habits, loss of appetite, bloody stools.   Resp:   No chest wall deformity  Skin: no rash or lesions.  GU: no dysuria, change in color of urine, no urgency or frequency.  No flank pain, no hematuria   MS:  No joint pain or swelling.  No decreased range of motion.  No back pain.    Physical Exam  BP 118/70 (BP Location: Left Arm, Cuff Size: Large)   Pulse 71   Temp (!) 97 F (36.1 C)   Ht 5\' 6"  (1.676 m)   Wt 207 lb 6.4 oz (94.1 kg)   SpO2 98%   BMI 33.48 kg/m   GEN: A/Ox3; pleasant , NAD, well nourished    HEENT:  [place]/AT,   , NOSE-clear,  No skin breakdown  NECK:  Supple w/ fair ROM; no JVD; normal carotid impulses w/o bruits; no thyromegaly or nodules palpated; no lymphadenopathy.    RESP  Clear  P & A; w/o, wheezes/ rales/ or rhonchi. no accessory muscle use, no dullness to percussion  CARD:  RRR, no m/r/g, tr  peripheral edema, pulses intact, no cyanosis or clubbing.  GI:   Soft & nt; nml bowel sounds; no organomegaly or masses detected.   Musco: Warm bil, no deformities or joint swelling noted.   Neuro: alert, no focal deficits noted.    Skin: Warm, no lesions or rashes    Lab Results:  CBC  BMET  BNP ProBNP  Imaging: DG Chest 2 View  Result Date: 10/12/2020 CLINICAL DATA:  Cough EXAM: CHEST - 2 VIEW COMPARISON:  12/02/2019, CT 08/21/2020, chest x-ray 01/02/2019 FINDINGS: Right shoulder replacement. Left-sided pacing device as before with atrial appendage clip. No focal opacity or pleural effusion. Stable cardiomediastinal silhouette with aortic atherosclerosis. No pneumothorax. Postsurgical changes at the thoracic inlet. IMPRESSION: No active cardiopulmonary disease. Electronically Signed   By: Donavan Foil M.D.   On: 10/12/2020  16:01      PFT Results Latest Ref Rng & Units 05/12/2019  FVC-Pre L 3.12  FVC-Predicted Pre % 100  FVC-Post L 3.12  FVC-Predicted Post % 100  Pre FEV1/FVC % % 84  Post FEV1/FCV  % % 86  FEV1-Pre L 2.62  FEV1-Predicted Pre % 112  FEV1-Post L 2.68  DLCO uncorrected ml/min/mmHg 12.69  DLCO UNC% % 61  DLVA Predicted % 66  TLC L 4.76  TLC % Predicted % 88  RV % Predicted % 64    No results found for: NITRICOXIDE      Assessment & Plan:   COVID-19 virus infection Clinically patient is improving from COVID-19.  Chest x-ray today shows no sign of acute pneumonia. Most likely has a postviral cough.  Have advised her to use Mucinex DM and Tessalon.  Can add in saline nasal spray for postnasal drainage.  Did discuss a short course of steroids however patient declines at this time.  Plan  Patient Instructions  Mucinex DM Twice daily  As needed  Cough/congestion .  Tessalon Three times a day  As needed  Cough  Saline nasal spray Twice daily   Saline nasal gel At bedtime   Continue on CPAP At bedtime   Call if you change your mind on Dream Wear full mask. (Can look at pricing on [email or web address])  I will call with Chest xray results.  Follow up with Dr. Halford Chessman  In 3 months and As needed   Please contact office for sooner follow up if symptoms do not improve or worsen or seek emergency care        OSA (obstructive sleep apnea) Patient is continue on nocturnal CPAP.  Discussed alternative mask to help prevent irritation to her nose.  However patient declines at this time  Plan  Patient Instructions  Mucinex DM Twice daily  As needed  Cough/congestion .  Tessalon Three times a day  As needed  Cough  Saline nasal spray Twice daily   Saline nasal gel At bedtime   Continue on CPAP At bedtime   Call if you change your mind on Dream Wear full mask. (Can look at pricing on [email or web address])  I will call with Chest xray results.  Follow up with Dr. Halford Chessman  In 3 months and As needed   Please contact office for sooner follow up if symptoms do not improve or worsen or seek emergency care           Rexene Edison, NP 10/12/2020

## 2020-10-12 NOTE — Progress Notes (Signed)
Reviewed and agree with assessment/plan.   Chesley Mires, MD [facility] Pulmonary/Critical Care 10/12/2020, 5:02 PM Pager:  [phone]

## 2020-10-12 NOTE — Patient Instructions (Addendum)
Mucinex DM Twice daily  As needed  Cough/congestion .  Tessalon Three times a day  As needed  Cough  Saline nasal spray Twice daily   Saline nasal gel At bedtime   Continue on CPAP At bedtime   Call if you change your mind on Dream Wear full mask. (Can look at pricing on [email or web address])  I will call with Chest xray results.  Follow up with Dr. Halford Chessman  In 3 months and As needed   Please contact office for sooner follow up if symptoms do not improve or worsen or seek emergency care

## 2020-10-12 NOTE — Assessment & Plan Note (Signed)
Patient is continue on nocturnal CPAP.  Discussed alternative mask to help prevent irritation to her nose.  However patient declines at this time  Plan  Patient Instructions  Mucinex DM Twice daily  As needed  Cough/congestion .  Tessalon Three times a day  As needed  Cough  Saline nasal spray Twice daily   Saline nasal gel At bedtime   Continue on CPAP At bedtime   Call if you change your mind on Dream Wear full mask. (Can look at pricing on [email or web address])  I will call with Chest xray results.  Follow up with Dr. Halford Chessman  In 3 months and As needed   Please contact office for sooner follow up if symptoms do not improve or worsen or seek emergency care

## 2020-11-02 IMAGING — CT CT CHEST WITH CONTRAST
2 of 3 series · 15 of 36 positions shown, 18 images · IV contrast (OMNIPAQUE)
Comparison: 08/10/2018

CLINICAL DATA: Persistent cough x1 year. History of large B-cell,
status post chemotherapy and XRT. Remote history of left breast
cancer, status post XRT.

EXAM:
CT CHEST WITH CONTRAST
TECHNIQUE: Multidetector CT imaging of the chest was performed during
intravenous contrast administration.
CONTRAST:  75mL OMNIPAQUE IOHEXOL 300 MG/ML  SOLN

[Series 2: axial st · axial · 0.79mm/px · z∈[-353,-81]mm · 12 of 160 slices shown, 15 images]
[im 12/160  mediastinal]
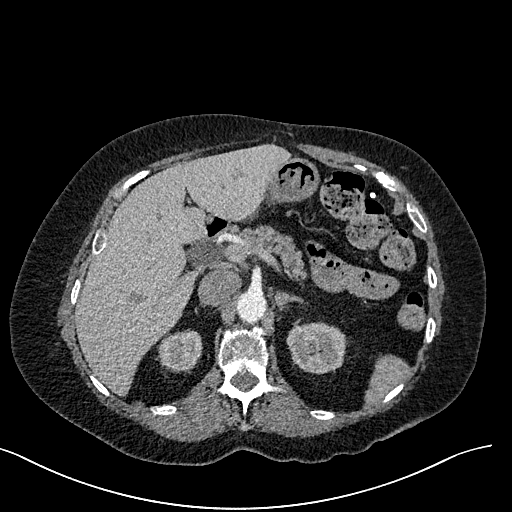
[im 12/160  lung]
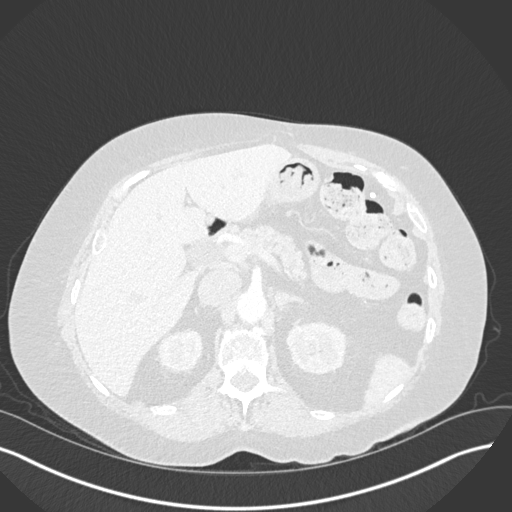
[im 24/160  lung]
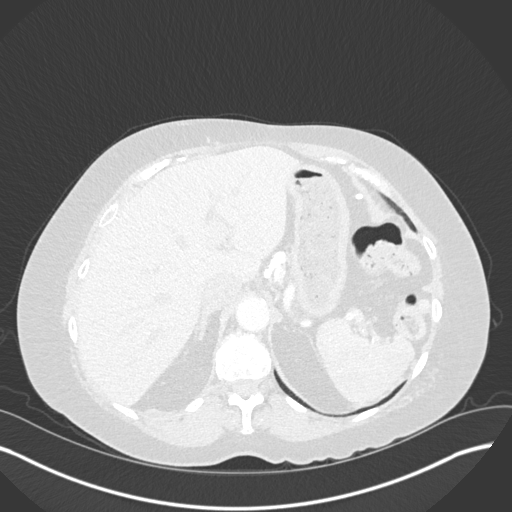
[im 36/160  lung]
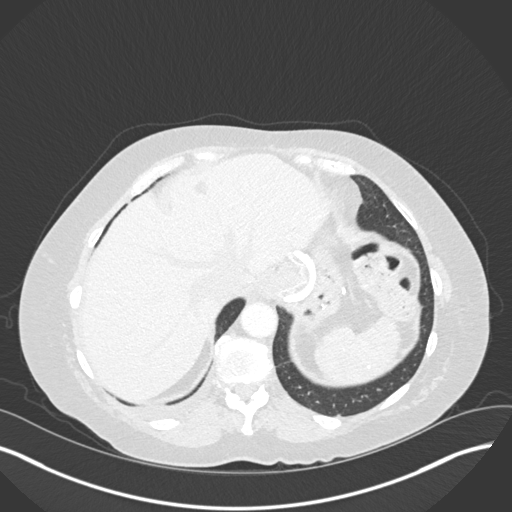
[im 48/160  lung]
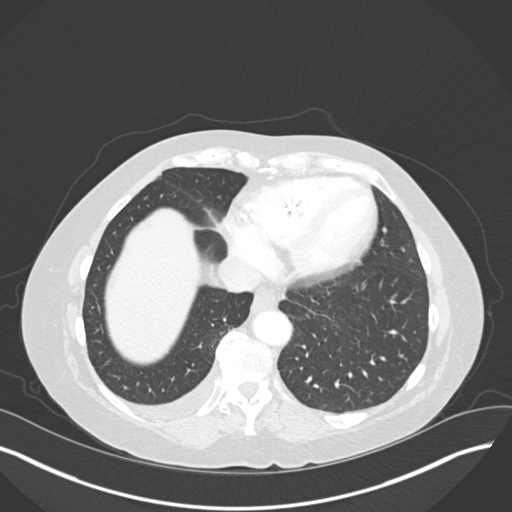
[im 59/160  mediastinal]
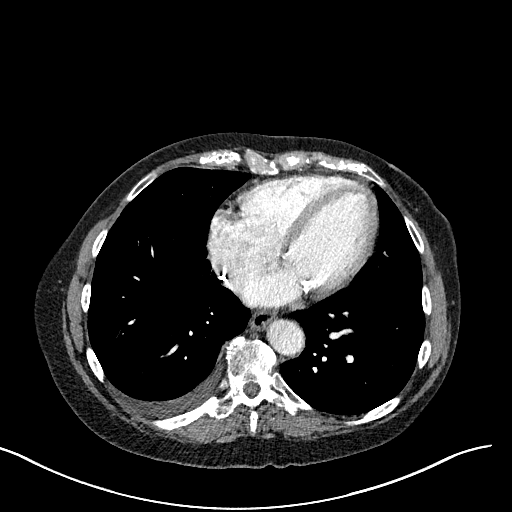
[im 59/160  lung]
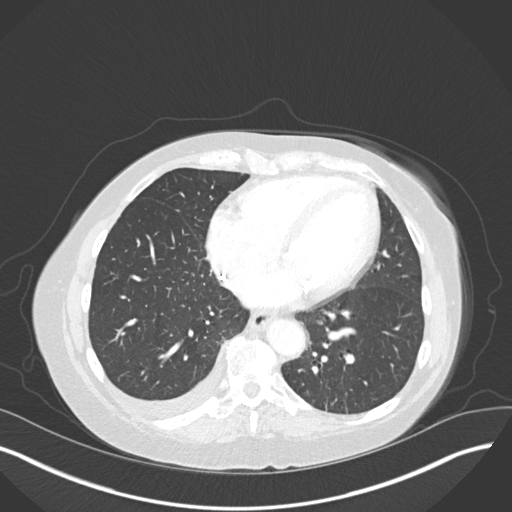
[im 71/160  lung]
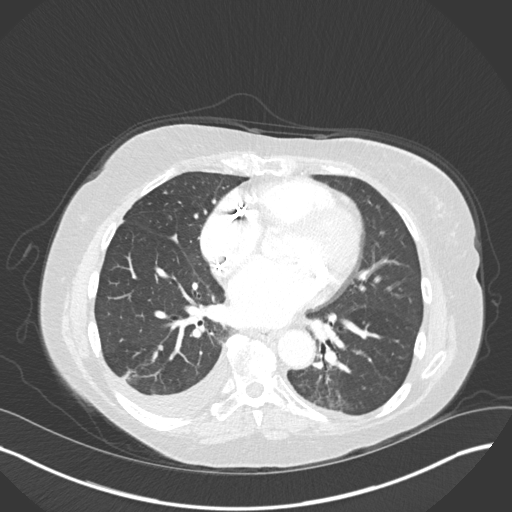
[im 89/160  lung]
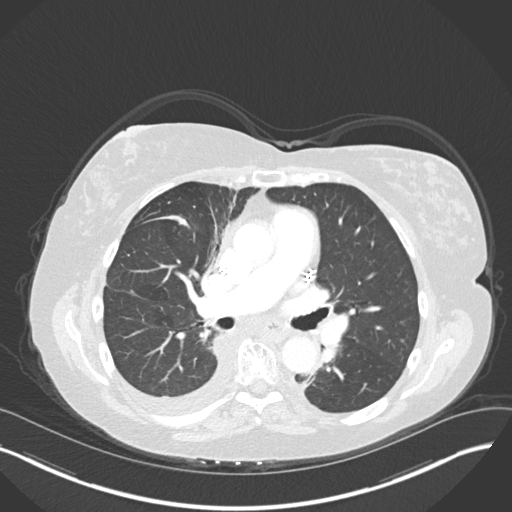
[im 101/160  lung]
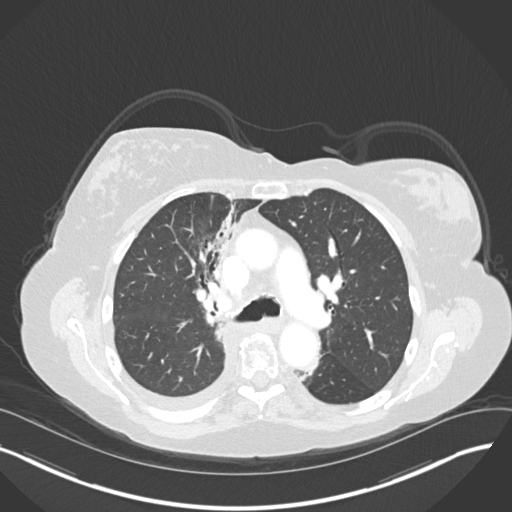
[im 112/160  mediastinal]
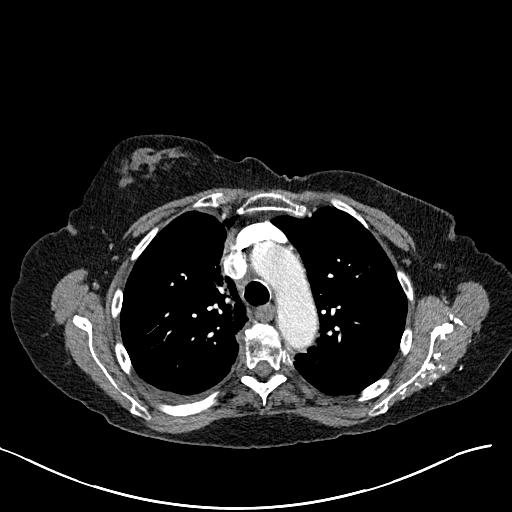
[im 112/160  lung]
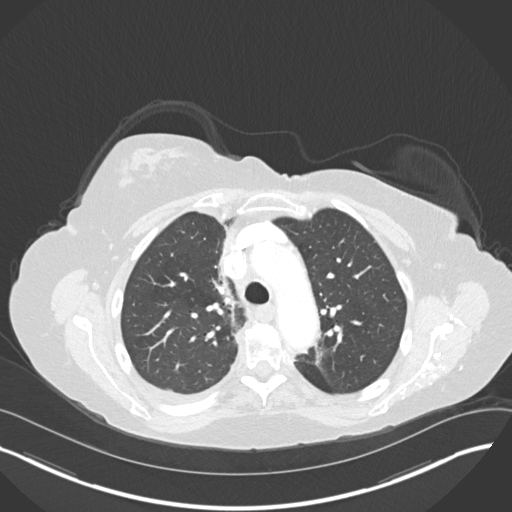
[im 124/160  lung]
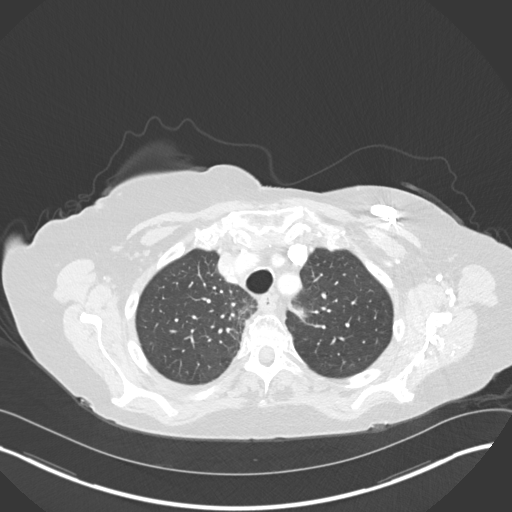
[im 136/160  lung]
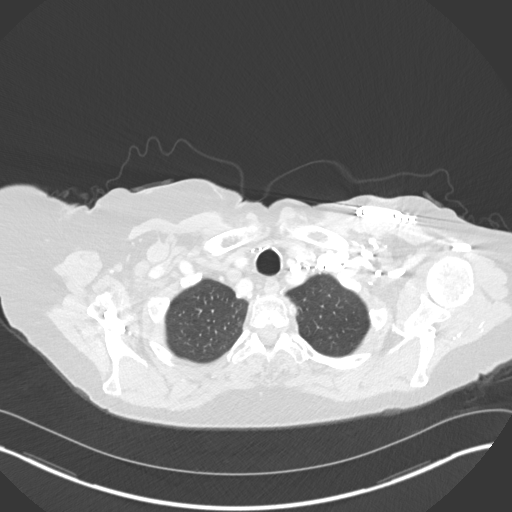
[im 148/160  lung]
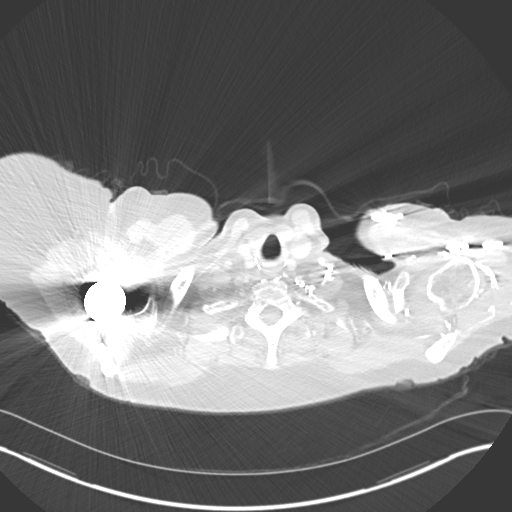

[Series 6: coronal · coronal · 0.65mm/px · 3 of 153 slices shown]
[im 31/153  lung]
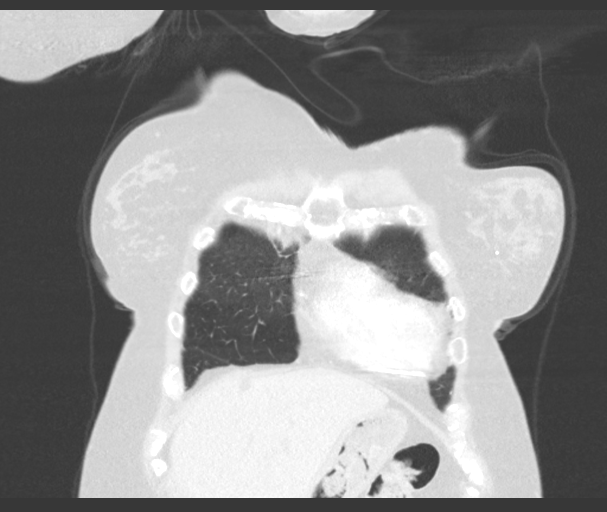
[im 61/153  lung]
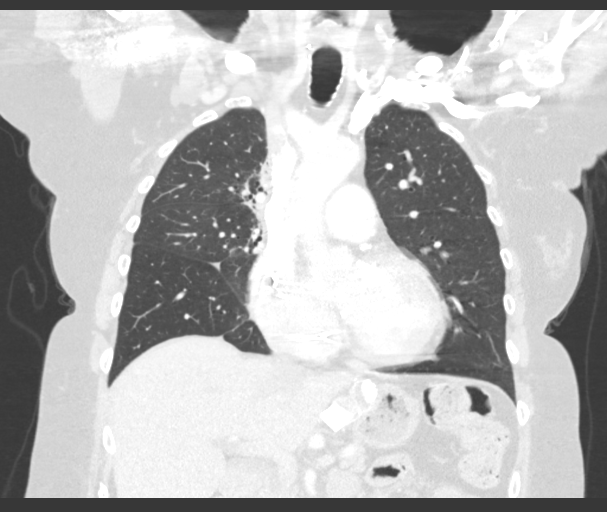
[im 92/153  lung]
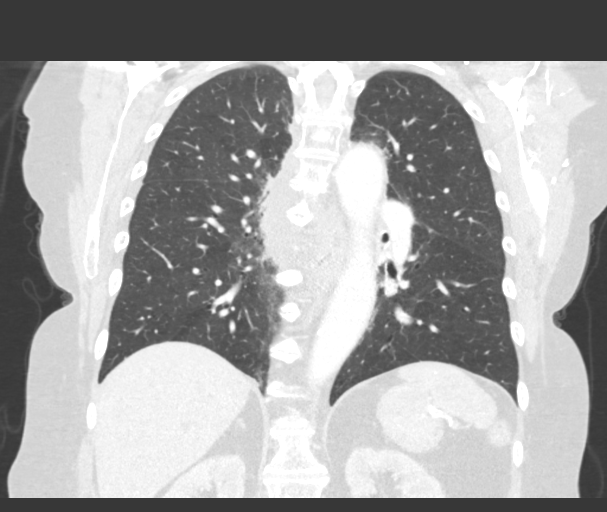

[15 of 36 positions shown; findings below may reference images not displayed]

FINDINGS: Cardiovascular: Mild cardiomegaly with left atrial enlargement. No
pericardial effusion.

No evidence of thoracic aortic aneurysm. Mild atherosclerotic
calcifications of the aortic arch.

Three vessel coronary atherosclerosis.

Left chest pacemaker.

Mediastinum/Nodes: No suspicious mediastinal lymphadenopathy.

Status post thyroidectomy.

Lungs/Pleura: Paramediastinal radiation changes in the lungs
bilaterally, right greater than left.

Clustered peribronchovascular nodular opacities in the posterior
right lower lobe, measuring up to 7 mm (series 5/image 96),
previously 14 mm. This favors resolving infection/inflammation with
post infectious/inflammatory scarring.

Small right pleural effusion, new.

No pneumothorax.

Upper Abdomen: Visualized upper abdomen is notable for a 10 mm
benign hepatic cyst in segment 4A (series 2/image 123) and
postprocedural changes related to a laparoscopic band in
satisfactory position.

Musculoskeletal: Status post left breast lumpectomy.

Right shoulder arthroplasty.

Degenerative changes of the thoracic spine.
IMPRESSION: Paramediastinal radiation changes in the lungs bilaterally. No
suspicious mediastinal lymphadenopathy.

Status post left breast lumpectomy. No evidence of recurrent or
metastatic disease.

Clustered peribronchovascular nodular opacities in the posterior
right lower lobe, improved, favoring post infectious/inflammatory
scarring.

Small right pleural effusion, new.

Aortic Atherosclerosis (FRA73-728.8).

## 2020-11-02 IMAGING — CR CHEST - 2 VIEW
2 series · 2 of 2 positions shown · non-contrast
Comparison: Chest x-rays dated 01/02/2019, 12/17/2018 and
10/06/2017.

CLINICAL DATA: Cough, dizziness, shortness of breath with exertion
for 2 months. History of lymphoma 2 years ago.

EXAM:
CHEST - 2 VIEW

[w chest pa]
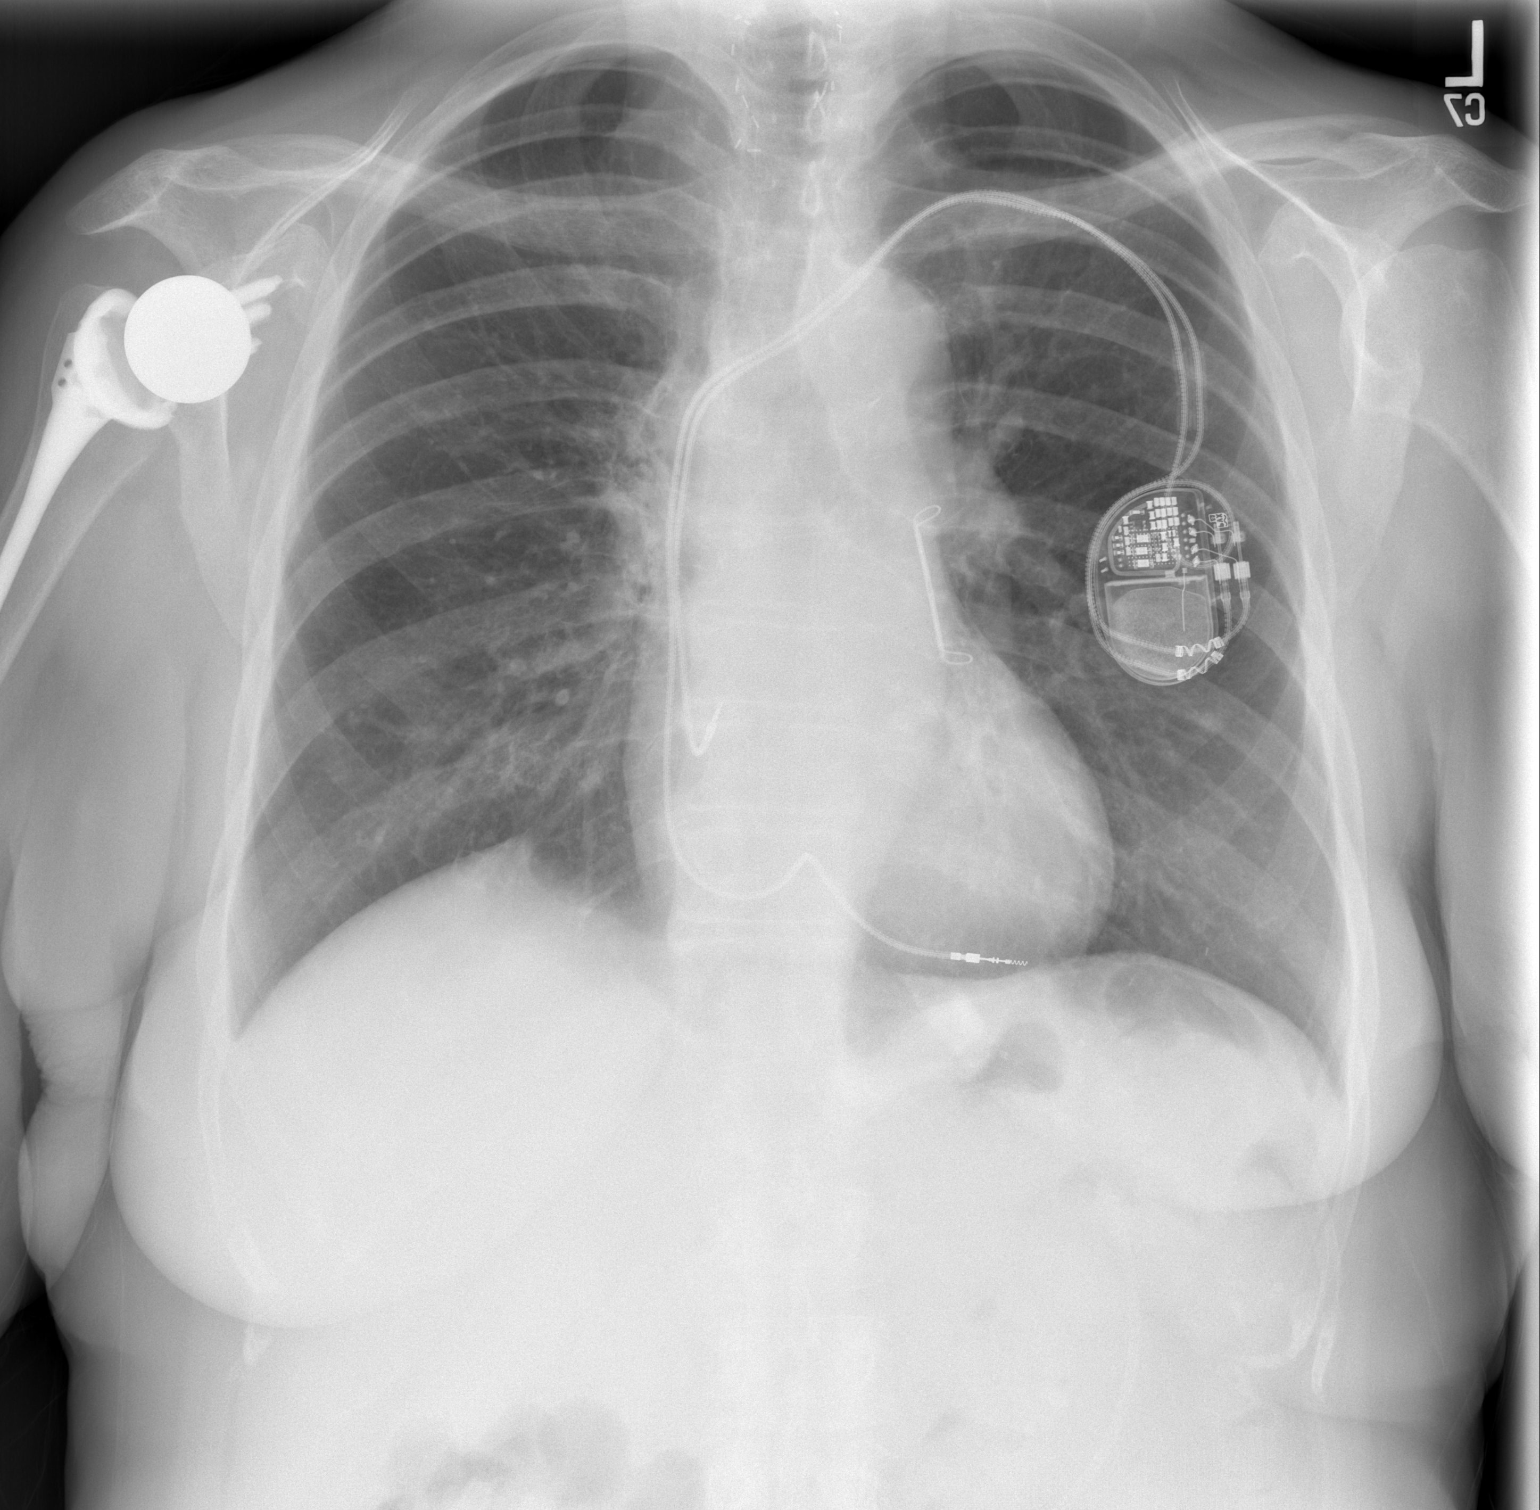

[w chest lat]
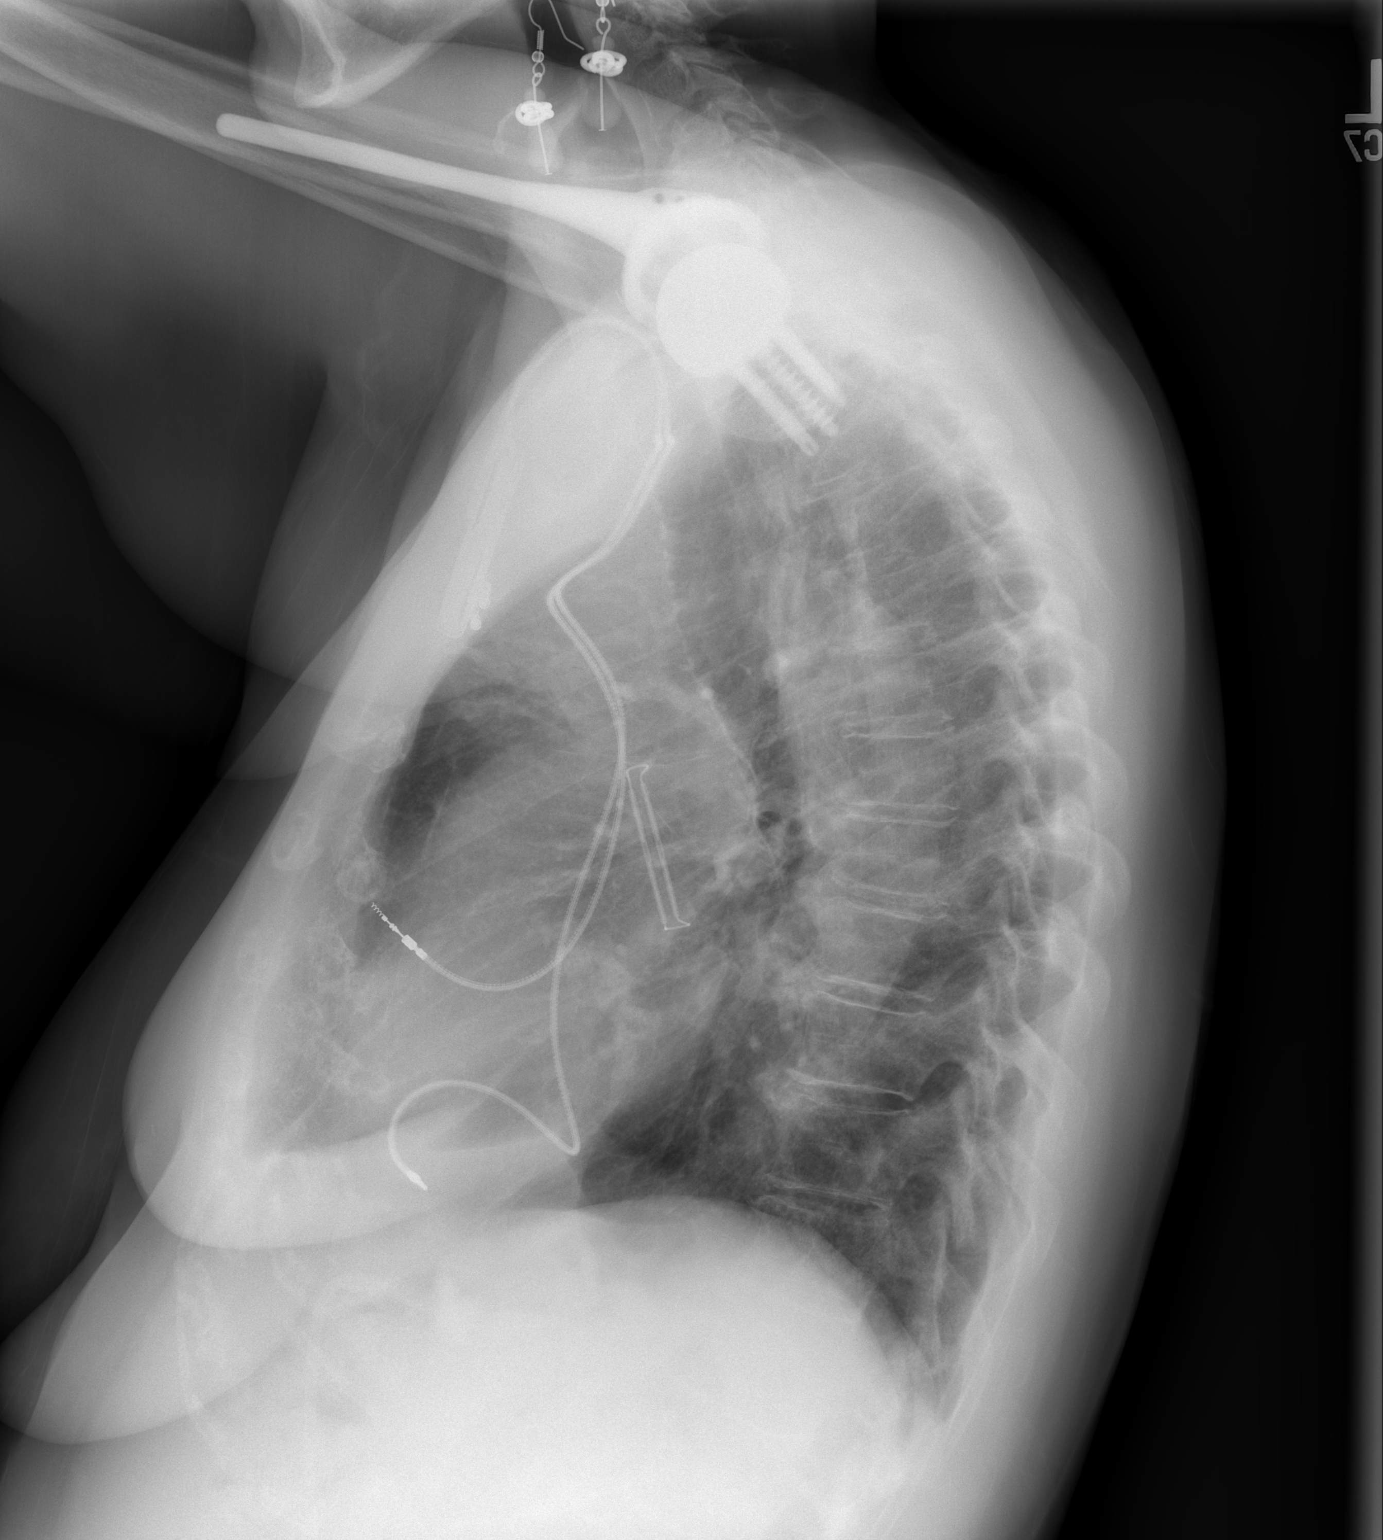

[2 of 2 positions shown; findings below may reference images not displayed]

FINDINGS: Heart size and mediastinal contours are stable. LEFT chest wall
pacemaker/ICD apparatus is stable. Stable chronic tenting of the
RIGHT hemidiaphragm, presumably related to overlying scarring. The
nodular opacity visualized at the RIGHT lung base on most recent
chest x-ray of 01/02/2019 has resolved confirming benignity. Lungs
otherwise clear. No pleural effusion or pneumothorax seen. No acute
or suspicious osseous finding.
IMPRESSION: No active cardiopulmonary disease. No evidence of pneumonia or
pulmonary edema.

## 2020-12-01 ENCOUNTER — Telehealth: Payer: Self-pay | Admitting: Adult Health

## 2020-12-04 ENCOUNTER — Ambulatory Visit (INDEPENDENT_AMBULATORY_CARE_PROVIDER_SITE_OTHER): Payer: Medicare Other

## 2020-12-04 DIAGNOSIS — I495 Sick sinus syndrome: Secondary | ICD-10-CM

## 2020-12-04 LAB — CUP PACEART REMOTE DEVICE CHECK
Battery Remaining Longevity: 32 mo
Battery Voltage: 2.95 V
Brady Statistic AP VP Percent: 1.46 %
Brady Statistic AP VS Percent: 73.47 %
Brady Statistic AS VP Percent: 0.47 %
Brady Statistic AS VS Percent: 24.61 %
Brady Statistic RA Percent Paced: 74.33 %
Brady Statistic RV Percent Paced: 1.94 %
Date Time Interrogation Session: 20220314103453
Implantable Lead Implant Date: 20140618
Implantable Lead Implant Date: 20140618
Implantable Lead Location: 753859
Implantable Lead Location: 753860
Implantable Pulse Generator Implant Date: 20140618
Lead Channel Impedance Value: 1026 Ohm
Lead Channel Impedance Value: 342 Ohm
Lead Channel Impedance Value: 418 Ohm
Lead Channel Impedance Value: 969 Ohm
Lead Channel Pacing Threshold Amplitude: 1 V
Lead Channel Pacing Threshold Amplitude: 1.25 V
Lead Channel Pacing Threshold Pulse Width: 0.4 ms
Lead Channel Pacing Threshold Pulse Width: 0.4 ms
Lead Channel Sensing Intrinsic Amplitude: 0.75 mV
Lead Channel Sensing Intrinsic Amplitude: 0.75 mV
Lead Channel Sensing Intrinsic Amplitude: 10 mV
Lead Channel Sensing Intrinsic Amplitude: 10 mV
Lead Channel Setting Pacing Amplitude: 2.5 V
Lead Channel Setting Pacing Amplitude: 2.5 V
Lead Channel Setting Pacing Pulse Width: 0.4 ms
Lead Channel Setting Sensing Sensitivity: 0.9 mV

## 2020-12-04 NOTE — Telephone Encounter (Signed)
Spoke with the pt  She is asking about new CPAP mask and if her insurance would cover this  I advised she would need to check with DME on this  Will need order  She wanted to hold off on the order until she checks on price  She will call back if she wants [place] to place the order  Nothing further needed at this time

## 2020-12-04 NOTE — Telephone Encounter (Signed)
We will need a order and we have no idea how much the cost would be she would have to ask the DME company looks like she uses [place]

## 2020-12-07 DIAGNOSIS — M5416 Radiculopathy, lumbar region: Secondary | ICD-10-CM | POA: Diagnosis not present

## 2020-12-07 DIAGNOSIS — S93492A Sprain of other ligament of left ankle, initial encounter: Secondary | ICD-10-CM | POA: Diagnosis not present

## 2020-12-07 DIAGNOSIS — M542 Cervicalgia: Secondary | ICD-10-CM | POA: Diagnosis not present

## 2020-12-11 NOTE — Progress Notes (Signed)
Remote pacemaker transmission.   

## 2020-12-15 ENCOUNTER — Other Ambulatory Visit: Payer: Self-pay

## 2020-12-15 ENCOUNTER — Inpatient Hospital Stay (HOSPITAL_COMMUNITY)
Admission: EM | Admit: 2020-12-15 | Discharge: 2020-12-22 | DRG: 580 | Disposition: A | Discharge status: Rehabilitation Facility | Payer: Medicare Other | Attending: Internal Medicine | Admitting: Internal Medicine

## 2020-12-15 ENCOUNTER — Emergency Department (HOSPITAL_COMMUNITY): Payer: Medicare Other

## 2020-12-15 ENCOUNTER — Encounter (HOSPITAL_COMMUNITY): Payer: Self-pay | Admitting: Internal Medicine

## 2020-12-15 DIAGNOSIS — E782 Mixed hyperlipidemia: Secondary | ICD-10-CM | POA: Diagnosis not present

## 2020-12-15 DIAGNOSIS — M7989 Other specified soft tissue disorders: Secondary | ICD-10-CM | POA: Diagnosis not present

## 2020-12-15 DIAGNOSIS — S42402A Unspecified fracture of lower end of left humerus, initial encounter for closed fracture: Secondary | ICD-10-CM | POA: Diagnosis present

## 2020-12-15 DIAGNOSIS — E039 Hypothyroidism, unspecified: Secondary | ICD-10-CM | POA: Diagnosis present

## 2020-12-15 DIAGNOSIS — M4802 Spinal stenosis, cervical region: Secondary | ICD-10-CM | POA: Diagnosis not present

## 2020-12-15 DIAGNOSIS — D62 Acute posthemorrhagic anemia: Secondary | ICD-10-CM | POA: Diagnosis not present

## 2020-12-15 DIAGNOSIS — Z853 Personal history of malignant neoplasm of breast: Secondary | ICD-10-CM

## 2020-12-15 DIAGNOSIS — K449 Diaphragmatic hernia without obstruction or gangrene: Secondary | ICD-10-CM | POA: Diagnosis not present

## 2020-12-15 DIAGNOSIS — W1830XD Fall on same level, unspecified, subsequent encounter: Secondary | ICD-10-CM | POA: Diagnosis not present

## 2020-12-15 DIAGNOSIS — I672 Cerebral atherosclerosis: Secondary | ICD-10-CM | POA: Diagnosis not present

## 2020-12-15 DIAGNOSIS — S52042D Displaced fracture of coronoid process of left ulna, subsequent encounter for closed fracture with routine healing: Secondary | ICD-10-CM | POA: Diagnosis not present

## 2020-12-15 DIAGNOSIS — G8929 Other chronic pain: Secondary | ICD-10-CM | POA: Diagnosis present

## 2020-12-15 DIAGNOSIS — G4700 Insomnia, unspecified: Secondary | ICD-10-CM | POA: Diagnosis present

## 2020-12-15 DIAGNOSIS — I517 Cardiomegaly: Secondary | ICD-10-CM | POA: Diagnosis not present

## 2020-12-15 DIAGNOSIS — R9082 White matter disease, unspecified: Secondary | ICD-10-CM | POA: Diagnosis not present

## 2020-12-15 DIAGNOSIS — Z9884 Bariatric surgery status: Secondary | ICD-10-CM | POA: Diagnosis not present

## 2020-12-15 DIAGNOSIS — J984 Other disorders of lung: Secondary | ICD-10-CM | POA: Diagnosis not present

## 2020-12-15 DIAGNOSIS — K5900 Constipation, unspecified: Secondary | ICD-10-CM | POA: Diagnosis present

## 2020-12-15 DIAGNOSIS — S5001XD Contusion of right elbow, subsequent encounter: Secondary | ICD-10-CM | POA: Diagnosis not present

## 2020-12-15 DIAGNOSIS — C8592 Non-Hodgkin lymphoma, unspecified, intrathoracic lymph nodes: Secondary | ICD-10-CM | POA: Diagnosis present

## 2020-12-15 DIAGNOSIS — I4819 Other persistent atrial fibrillation: Secondary | ICD-10-CM | POA: Diagnosis present

## 2020-12-15 DIAGNOSIS — Z91048 Other nonmedicinal substance allergy status: Secondary | ICD-10-CM

## 2020-12-15 DIAGNOSIS — R600 Localized edema: Secondary | ICD-10-CM | POA: Diagnosis not present

## 2020-12-15 DIAGNOSIS — S7012XA Contusion of left thigh, initial encounter: Secondary | ICD-10-CM | POA: Diagnosis not present

## 2020-12-15 DIAGNOSIS — I890 Lymphedema, not elsewhere classified: Secondary | ICD-10-CM | POA: Diagnosis present

## 2020-12-15 DIAGNOSIS — C8522 Mediastinal (thymic) large B-cell lymphoma, intrathoracic lymph nodes: Secondary | ICD-10-CM | POA: Diagnosis not present

## 2020-12-15 DIAGNOSIS — E669 Obesity, unspecified: Secondary | ICD-10-CM | POA: Diagnosis present

## 2020-12-15 DIAGNOSIS — S73192S Other sprain of left hip, sequela: Secondary | ICD-10-CM | POA: Diagnosis not present

## 2020-12-15 DIAGNOSIS — Z6832 Body mass index (BMI) 32.0-32.9, adult: Secondary | ICD-10-CM | POA: Diagnosis not present

## 2020-12-15 DIAGNOSIS — Z96611 Presence of right artificial shoulder joint: Secondary | ICD-10-CM | POA: Diagnosis present

## 2020-12-15 DIAGNOSIS — S2241XA Multiple fractures of ribs, right side, initial encounter for closed fracture: Secondary | ICD-10-CM | POA: Diagnosis present

## 2020-12-15 DIAGNOSIS — Z87828 Personal history of other (healed) physical injury and trauma: Secondary | ICD-10-CM | POA: Diagnosis not present

## 2020-12-15 DIAGNOSIS — Z923 Personal history of irradiation: Secondary | ICD-10-CM

## 2020-12-15 DIAGNOSIS — E890 Postprocedural hypothyroidism: Secondary | ICD-10-CM

## 2020-12-15 DIAGNOSIS — I110 Hypertensive heart disease with heart failure: Secondary | ICD-10-CM | POA: Diagnosis present

## 2020-12-15 DIAGNOSIS — S52045A Nondisplaced fracture of coronoid process of left ulna, initial encounter for closed fracture: Secondary | ICD-10-CM

## 2020-12-15 DIAGNOSIS — A4151 Sepsis due to Escherichia coli [E. coli]: Secondary | ICD-10-CM | POA: Diagnosis not present

## 2020-12-15 DIAGNOSIS — W07XXXA Fall from chair, initial encounter: Secondary | ICD-10-CM | POA: Diagnosis present

## 2020-12-15 DIAGNOSIS — S76312S Strain of muscle, fascia and tendon of the posterior muscle group at thigh level, left thigh, sequela: Secondary | ICD-10-CM | POA: Diagnosis not present

## 2020-12-15 DIAGNOSIS — R5381 Other malaise: Secondary | ICD-10-CM | POA: Diagnosis not present

## 2020-12-15 DIAGNOSIS — I10 Essential (primary) hypertension: Secondary | ICD-10-CM | POA: Diagnosis not present

## 2020-12-15 DIAGNOSIS — W19XXXD Unspecified fall, subsequent encounter: Secondary | ICD-10-CM | POA: Diagnosis not present

## 2020-12-15 DIAGNOSIS — S2249XA Multiple fractures of ribs, unspecified side, initial encounter for closed fracture: Secondary | ICD-10-CM | POA: Diagnosis present

## 2020-12-15 DIAGNOSIS — S7002XA Contusion of left hip, initial encounter: Secondary | ICD-10-CM | POA: Diagnosis present

## 2020-12-15 DIAGNOSIS — I708 Atherosclerosis of other arteries: Secondary | ICD-10-CM | POA: Diagnosis not present

## 2020-12-15 DIAGNOSIS — Z20822 Contact with and (suspected) exposure to covid-19: Secondary | ICD-10-CM | POA: Diagnosis present

## 2020-12-15 DIAGNOSIS — A499 Bacterial infection, unspecified: Secondary | ICD-10-CM | POA: Diagnosis not present

## 2020-12-15 DIAGNOSIS — J9811 Atelectasis: Secondary | ICD-10-CM | POA: Diagnosis not present

## 2020-12-15 DIAGNOSIS — K5901 Slow transit constipation: Secondary | ICD-10-CM | POA: Diagnosis not present

## 2020-12-15 DIAGNOSIS — M79601 Pain in right arm: Secondary | ICD-10-CM | POA: Diagnosis not present

## 2020-12-15 DIAGNOSIS — Z950 Presence of cardiac pacemaker: Secondary | ICD-10-CM | POA: Diagnosis present

## 2020-12-15 DIAGNOSIS — S73102D Unspecified sprain of left hip, subsequent encounter: Secondary | ICD-10-CM | POA: Diagnosis not present

## 2020-12-15 DIAGNOSIS — Z8673 Personal history of transient ischemic attack (TIA), and cerebral infarction without residual deficits: Secondary | ICD-10-CM

## 2020-12-15 DIAGNOSIS — T8130XA Disruption of wound, unspecified, initial encounter: Secondary | ICD-10-CM | POA: Diagnosis not present

## 2020-12-15 DIAGNOSIS — I495 Sick sinus syndrome: Secondary | ICD-10-CM | POA: Diagnosis present

## 2020-12-15 DIAGNOSIS — D72829 Elevated white blood cell count, unspecified: Secondary | ICD-10-CM | POA: Diagnosis present

## 2020-12-15 DIAGNOSIS — I509 Heart failure, unspecified: Secondary | ICD-10-CM | POA: Diagnosis present

## 2020-12-15 DIAGNOSIS — T148XXA Other injury of unspecified body region, initial encounter: Secondary | ICD-10-CM | POA: Diagnosis not present

## 2020-12-15 DIAGNOSIS — M8580 Other specified disorders of bone density and structure, unspecified site: Secondary | ICD-10-CM | POA: Diagnosis present

## 2020-12-15 DIAGNOSIS — B952 Enterococcus as the cause of diseases classified elsewhere: Secondary | ICD-10-CM | POA: Diagnosis not present

## 2020-12-15 DIAGNOSIS — I480 Paroxysmal atrial fibrillation: Secondary | ICD-10-CM | POA: Diagnosis not present

## 2020-12-15 DIAGNOSIS — Z79890 Hormone replacement therapy (postmenopausal): Secondary | ICD-10-CM

## 2020-12-15 DIAGNOSIS — Z85850 Personal history of malignant neoplasm of thyroid: Secondary | ICD-10-CM | POA: Diagnosis not present

## 2020-12-15 DIAGNOSIS — I5032 Chronic diastolic (congestive) heart failure: Secondary | ICD-10-CM | POA: Diagnosis not present

## 2020-12-15 DIAGNOSIS — I4891 Unspecified atrial fibrillation: Secondary | ICD-10-CM | POA: Diagnosis not present

## 2020-12-15 DIAGNOSIS — Z2239 Carrier of other specified bacterial diseases: Secondary | ICD-10-CM | POA: Diagnosis not present

## 2020-12-15 DIAGNOSIS — C8590 Non-Hodgkin lymphoma, unspecified, unspecified site: Secondary | ICD-10-CM | POA: Diagnosis present

## 2020-12-15 DIAGNOSIS — M79672 Pain in left foot: Secondary | ICD-10-CM | POA: Diagnosis not present

## 2020-12-15 DIAGNOSIS — S76312D Strain of muscle, fascia and tendon of the posterior muscle group at thigh level, left thigh, subsequent encounter: Secondary | ICD-10-CM | POA: Diagnosis not present

## 2020-12-15 DIAGNOSIS — M47816 Spondylosis without myelopathy or radiculopathy, lumbar region: Secondary | ICD-10-CM | POA: Diagnosis not present

## 2020-12-15 DIAGNOSIS — E785 Hyperlipidemia, unspecified: Secondary | ICD-10-CM | POA: Diagnosis present

## 2020-12-15 DIAGNOSIS — Z96651 Presence of right artificial knee joint: Secondary | ICD-10-CM | POA: Diagnosis present

## 2020-12-15 DIAGNOSIS — W19XXXA Unspecified fall, initial encounter: Secondary | ICD-10-CM | POA: Diagnosis present

## 2020-12-15 DIAGNOSIS — S73192A Other sprain of left hip, initial encounter: Secondary | ICD-10-CM | POA: Diagnosis not present

## 2020-12-15 DIAGNOSIS — R296 Repeated falls: Secondary | ICD-10-CM | POA: Diagnosis present

## 2020-12-15 DIAGNOSIS — Z888 Allergy status to other drugs, medicaments and biological substances status: Secondary | ICD-10-CM

## 2020-12-15 DIAGNOSIS — Z7901 Long term (current) use of anticoagulants: Secondary | ICD-10-CM

## 2020-12-15 DIAGNOSIS — Z8249 Family history of ischemic heart disease and other diseases of the circulatory system: Secondary | ICD-10-CM

## 2020-12-15 DIAGNOSIS — T8131XS Disruption of external operation (surgical) wound, not elsewhere classified, sequela: Secondary | ICD-10-CM | POA: Diagnosis not present

## 2020-12-15 DIAGNOSIS — K5903 Drug induced constipation: Secondary | ICD-10-CM | POA: Diagnosis not present

## 2020-12-15 DIAGNOSIS — S7002XD Contusion of left hip, subsequent encounter: Secondary | ICD-10-CM | POA: Diagnosis not present

## 2020-12-15 DIAGNOSIS — M25552 Pain in left hip: Secondary | ICD-10-CM | POA: Diagnosis not present

## 2020-12-15 DIAGNOSIS — S52042A Displaced fracture of coronoid process of left ulna, initial encounter for closed fracture: Secondary | ICD-10-CM | POA: Diagnosis present

## 2020-12-15 DIAGNOSIS — N390 Urinary tract infection, site not specified: Secondary | ICD-10-CM | POA: Diagnosis not present

## 2020-12-15 DIAGNOSIS — L603 Nail dystrophy: Secondary | ICD-10-CM | POA: Diagnosis present

## 2020-12-15 DIAGNOSIS — I951 Orthostatic hypotension: Secondary | ICD-10-CM

## 2020-12-15 DIAGNOSIS — M1612 Unilateral primary osteoarthritis, left hip: Secondary | ICD-10-CM | POA: Diagnosis not present

## 2020-12-15 DIAGNOSIS — Z85118 Personal history of other malignant neoplasm of bronchus and lung: Secondary | ICD-10-CM

## 2020-12-15 DIAGNOSIS — M170 Bilateral primary osteoarthritis of knee: Secondary | ICD-10-CM | POA: Diagnosis present

## 2020-12-15 DIAGNOSIS — Z8572 Personal history of non-Hodgkin lymphomas: Secondary | ICD-10-CM | POA: Diagnosis not present

## 2020-12-15 DIAGNOSIS — M47812 Spondylosis without myelopathy or radiculopathy, cervical region: Secondary | ICD-10-CM | POA: Diagnosis not present

## 2020-12-15 DIAGNOSIS — I69320 Aphasia following cerebral infarction: Secondary | ICD-10-CM | POA: Diagnosis not present

## 2020-12-15 DIAGNOSIS — S76312A Strain of muscle, fascia and tendon of the posterior muscle group at thigh level, left thigh, initial encounter: Secondary | ICD-10-CM | POA: Diagnosis not present

## 2020-12-15 DIAGNOSIS — Z79899 Other long term (current) drug therapy: Secondary | ICD-10-CM

## 2020-12-15 LAB — CBC WITH DIFFERENTIAL/PLATELET
Abs Immature Granulocytes: 0.09 10*3/uL — ABNORMAL HIGH (ref 0.00–0.07)
Basophils Absolute: 0.1 10*3/uL (ref 0.0–0.1)
Basophils Relative: 1 %
Eosinophils Absolute: 0 10*3/uL (ref 0.0–0.5)
Eosinophils Relative: 0 %
HCT: 41.5 % (ref 36.0–46.0)
Hemoglobin: 13.4 g/dL (ref 12.0–15.0)
Immature Granulocytes: 1 %
Lymphocytes Relative: 12 %
Lymphs Abs: 1 10*3/uL (ref 0.7–4.0)
MCH: 30.5 pg (ref 26.0–34.0)
MCHC: 32.3 g/dL (ref 30.0–36.0)
MCV: 94.3 fL (ref 80.0–100.0)
Monocytes Absolute: 0.9 10*3/uL (ref 0.1–1.0)
Monocytes Relative: 11 %
Neutro Abs: 6.2 10*3/uL (ref 1.7–7.7)
Neutrophils Relative %: 75 %
Platelets: 171 10*3/uL (ref 150–400)
RBC: 4.4 MIL/uL (ref 3.87–5.11)
RDW: 15.4 % (ref 11.5–15.5)
WBC: 8.3 10*3/uL (ref 4.0–10.5)
nRBC: 0 % (ref 0.0–0.2)

## 2020-12-15 LAB — URINALYSIS, ROUTINE W REFLEX MICROSCOPIC
Bilirubin Urine: NEGATIVE
Glucose, UA: NEGATIVE mg/dL
Hgb urine dipstick: NEGATIVE
Ketones, ur: NEGATIVE mg/dL
Leukocytes,Ua: NEGATIVE
Nitrite: NEGATIVE
Protein, ur: NEGATIVE mg/dL
Specific Gravity, Urine: 1.016 (ref 1.005–1.030)
pH: 8 (ref 5.0–8.0)

## 2020-12-15 LAB — BASIC METABOLIC PANEL
Anion gap: 9 (ref 5–15)
BUN: 21 mg/dL (ref 8–23)
CO2: 24 mmol/L (ref 22–32)
Calcium: 8.6 mg/dL — ABNORMAL LOW (ref 8.9–10.3)
Chloride: 101 mmol/L (ref 98–111)
Creatinine, Ser: 0.92 mg/dL (ref 0.44–1.00)
GFR, Estimated: >60 mL/min (ref >60)
Glucose, Bld: 104 mg/dL — ABNORMAL HIGH (ref 70–99)
Potassium: 4.7 mmol/L (ref 3.5–5.1)
Sodium: 134 mmol/L — ABNORMAL LOW (ref 135–145)

## 2020-12-15 LAB — RESP PANEL BY RT-PCR (FLU A&B, COVID) ARPGX2
Influenza A by PCR: NEGATIVE
Influenza B by PCR: NEGATIVE
SARS Coronavirus 2 by RT PCR: NEGATIVE

## 2020-12-15 MED ORDER — BISACODYL 5 MG PO TBEC
5.0000 mg | DELAYED_RELEASE_TABLET | Freq: Every day | ORAL | Status: DC | PRN
  Administered 2020-12-18 – 2020-12-20 (×3): 5 mg via ORAL
  Filled 2020-12-15 (×3): qty 1

## 2020-12-15 MED ORDER — DOCUSATE SODIUM 100 MG PO CAPS
100.0000 mg | ORAL_CAPSULE | Freq: Two times a day (BID) | ORAL | Status: DC
  Administered 2020-12-15 – 2020-12-21 (×9): 100 mg via ORAL
  Filled 2020-12-15 (×13): qty 1

## 2020-12-15 MED ORDER — LEVOTHYROXINE SODIUM 25 MCG PO TABS
137.0000 ug | ORAL_TABLET | ORAL | Status: DC
  Administered 2020-12-16: 137 ug via ORAL
  Filled 2020-12-15: qty 1

## 2020-12-15 MED ORDER — HYDROCODONE-ACETAMINOPHEN 5-325 MG PO TABS
1.0000 | ORAL_TABLET | ORAL | Status: DC | PRN
  Administered 2020-12-15 – 2020-12-17 (×7): 1 via ORAL
  Filled 2020-12-15 (×5): qty 1
  Filled 2020-12-15: qty 2
  Filled 2020-12-15 (×2): qty 1

## 2020-12-15 MED ORDER — HYDRALAZINE HCL 20 MG/ML IJ SOLN: 5.0000 mg | INTRAMUSCULAR | Status: DC | PRN

## 2020-12-15 MED ORDER — ADULT MULTIVITAMIN W/MINERALS CH
1.0000 | ORAL_TABLET | Freq: Every day | ORAL | Status: DC
  Administered 2020-12-15 – 2020-12-22 (×7): 1 via ORAL
  Filled 2020-12-15 (×7): qty 1

## 2020-12-15 MED ORDER — LEVOTHYROXINE SODIUM 25 MCG PO TABS
68.5000 ug | ORAL_TABLET | ORAL | Status: DC
  Administered 2020-12-16: 68.5 ug via ORAL
  Filled 2020-12-15: qty 1

## 2020-12-15 MED ORDER — ONDANSETRON HCL 4 MG/2ML IJ SOLN: 4.0000 mg | Freq: Four times a day (QID) | INTRAMUSCULAR | Status: DC | PRN

## 2020-12-15 MED ORDER — SENNA 8.6 MG PO TABS
1.0000 | ORAL_TABLET | ORAL | Status: DC | PRN
  Administered 2020-12-19: 8.6 mg via ORAL
  Filled 2020-12-15: qty 1

## 2020-12-15 MED ORDER — MORPHINE SULFATE (PF) 2 MG/ML IV SOLN: 2.0000 mg | INTRAVENOUS | Status: DC | PRN

## 2020-12-15 MED ORDER — MORPHINE SULFATE (PF) 4 MG/ML IV SOLN
4.0000 mg | Freq: Once | INTRAVENOUS | Status: AC
  Administered 2020-12-15: 4 mg via INTRAVENOUS
  Filled 2020-12-15: qty 1

## 2020-12-15 MED ORDER — LIDOCAINE 5 % EX PTCH
1.0000 | MEDICATED_PATCH | CUTANEOUS | Status: DC
  Administered 2020-12-15 – 2020-12-16 (×2): 1 via TRANSDERMAL
  Filled 2020-12-15 (×2): qty 1

## 2020-12-15 MED ORDER — AMIODARONE HCL 200 MG PO TABS
200.0000 mg | ORAL_TABLET | Freq: Every day | ORAL | Status: DC
  Administered 2020-12-15 – 2020-12-21 (×6): 200 mg via ORAL
  Filled 2020-12-15 (×6): qty 1

## 2020-12-15 MED ORDER — ONDANSETRON HCL 4 MG/2ML IJ SOLN
4.0000 mg | Freq: Once | INTRAMUSCULAR | Status: AC
  Administered 2020-12-15: 4 mg via INTRAVENOUS
  Filled 2020-12-15: qty 2

## 2020-12-15 MED ORDER — ONDANSETRON HCL 4 MG PO TABS: 4.0000 mg | ORAL_TABLET | Freq: Four times a day (QID) | ORAL | Status: DC | PRN

## 2020-12-15 MED ORDER — LIFITEGRAST 5 % OP SOLN: 1.0000 [drp] | Freq: Every day | OPHTHALMIC | Status: DC

## 2020-12-15 MED ORDER — ACETAMINOPHEN 325 MG PO TABS
650.0000 mg | ORAL_TABLET | Freq: Four times a day (QID) | ORAL | Status: DC | PRN
  Administered 2020-12-16 – 2020-12-18 (×3): 650 mg via ORAL
  Filled 2020-12-15 (×3): qty 2

## 2020-12-15 MED ORDER — HEPARIN (PORCINE) 25000 UT/250ML-% IV SOLN
1050.0000 [IU]/h | INTRAVENOUS | Status: DC
  Administered 2020-12-15: 950 [IU]/h via INTRAVENOUS
  Administered 2020-12-16: 1050 [IU]/h via INTRAVENOUS
  Filled 2020-12-15 (×3): qty 250

## 2020-12-15 MED ORDER — POLYETHYLENE GLYCOL 3350 17 G PO PACK: 17.0000 g | PACK | Freq: Every day | ORAL | Status: DC | PRN

## 2020-12-15 MED ORDER — ROSUVASTATIN CALCIUM 5 MG PO TABS
10.0000 mg | ORAL_TABLET | ORAL | Status: DC
  Administered 2020-12-18 – 2020-12-21 (×2): 10 mg via ORAL
  Filled 2020-12-15 (×2): qty 2

## 2020-12-15 MED ORDER — LEVOTHYROXINE SODIUM 137 MCG PO TABS: 68.5000 ug | ORAL_TABLET | ORAL | Status: DC

## 2020-12-15 MED ORDER — ACETAMINOPHEN 650 MG RE SUPP: 650.0000 mg | Freq: Four times a day (QID) | RECTAL | Status: DC | PRN

## 2020-12-15 NOTE — ED Notes (Signed)
Report called. Awaiting IV team to obtain IV access and for Ortho Tech to apply splint.

## 2020-12-15 NOTE — ED Notes (Signed)
Called Ortho tech to apply splint.

## 2020-12-15 NOTE — Progress Notes (Signed)
ANTICOAGULATION CONSULT NOTE - Initial Consult  Pharmacy Consult for heparin Indication: atrial fibrillation  Allergies  Allergen Reactions  . Dofetilide Other (See Comments)    Prolonged QT interval   . Ranolazine     Balance issues  . Rivaroxaban Other (See Comments)    Nose Bleed X 6 hrs ; packing in ER Nasal hemorrhage  . Rivaroxaban     Nose Bleed X 6 hrs ; packing in ER Nasal hemorrhage  . Tape Other (See Comments)    SKIN IS THIN AND TEARS EASILY   . Tikosyn [Dofetilide] Other (See Comments)    "Long QT wave"   . Epinephrine Other (See Comments)    Oral anesthetic Dental form only (liquid). Patient stated she will become "out of it" she can hear you but cannot respond in normal fashion. "not fully with it"  . Epinephrine   . Ketamine Other (See Comments)    Hallucinations   . Adhesive [Tape] Rash    Paper tape as well  . Ketamine Other (See Comments)    HALLUCINATIONS    Patient Measurements: Height: 5\' 6"  (167.6 cm) Weight: 94 kg (207 lb 3.7 oz) IBW/kg (Calculated) : 59.3 Heparin Dosing Weight: 80kg  Vital Signs: Temp: 98.7 F (37.1 C) (03/25 1200) Temp Source: Oral (03/25 1200) BP: 139/71 (03/25 1200) Pulse Rate: 70 (03/25 1200)  Labs: Recent Labs    12/15/20 0559  HGB 13.4  HCT 41.5  PLT 171  CREATININE 0.92    Estimated Creatinine Clearance: 60.1 mL/min (by C-G formula based on SCr of 0.92 mg/dL).   Medical History: Past Medical History:  Diagnosis Date  . Anemia   . Arthritis    osteoarthritis - knees and right shoulder  . Blood transfusion without reported diagnosis   . Breast cancer ([place])    Dr Margot Chimes, total thyroidectomy- 1999- for cancer  . 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 Chronic bilateral pleural effusions   . Colon polyp    Dr Earlean Shawl  . Complication of anesthesia    Ketamine produces LSD reaction, bright colored nightmarish experience   . Dyslipidemia   . Endometriosis   . Fibroids   . H/O pleural effusion    s/p thoracentesis w  3260ml withdrawn  . Hepatitis    [place] ?hepatitis   . History of dysphagia    due to radiation therapy  . History of hiatal hernia    small noted on PET scan  . Hypertension   . Hypothyroidism   . Lung cancer, lower lobe ([place]) 01/2017   radiation RX completed 03/04/17; will start chemo 6/27, pt unaware of lung cancer  . Morbid obesity ([place])    Status post lap band surgery  . Nephrolithiasis   . Non Hodgkin's lymphoma ([place])    on chemotherapy  . Persistent atrial fibrillation ([place])    a. s/p PVI 2008 b. s/p convergent ablation 9379 complicated by bradycardia requiring pacemaker implant  . Personal history of radiation therapy   . Presence of permanent cardiac pacemaker   . Rotator cuff tear    Right  . Stroke [facility])    2003- [place] x2  . SVC syndrome    with lung mass and non hodgkins lymphoma  . Thyroid cancer ([place]) 2000   Assessment: 29 YOF presenting s/p fall, on Eliquis PTA for afib with last dose taken 3/24 @1900 .  CT head negative, elbow/rib fx, hip hematoma, CBC wnl, OK to resume AC per MD.  Will use  low end goals and will not bolus.  Goal of Therapy:  Heparin level 0.3-0.5 units/ml aPTT 66-85 seconds Monitor platelets by anticoagulation protocol: Yes   Plan:  Heparin gtt at 950 units/hr, no bolus F/u aPTT/HL in 8 hours F/u long term [facility] plan  Bertis Ruddy, PharmD Clinical Pharmacist ED Pharmacist Phone # [phone] 12/15/2020 12:26 PM

## 2020-12-15 NOTE — Plan of Care (Signed)
  Problem: Health Behavior/Discharge Planning: Goal: Ability to manage health-related needs will improve Outcome: Not Progressing   Problem: Pain Managment: Goal: General experience of comfort will improve Outcome: Progressing

## 2020-12-15 NOTE — ED Provider Notes (Signed)
[facility] EMERGENCY DEPARTMENT Provider Note   CSN: [id number] Arrival date & time: 12/15/20  0546     History No chief complaint on file.   Kayla Harrison is a 75 y.o. female.  Patient with history of atrial fibrillation on Eliquis presents from home where she lives by herself after multiple falls.  Patient tried to get up in the middle the night and slid out of bed.  She was unable to get up and had to call EMS for a lift assist.  She refused transport to the hospital.  Later in the morning she tried to get out of bed again, stood up and fell backwards, and ended up underneath a chair.  She called EMS again and was transported to the hospital.  Patient currently complains of left elbow and left hip pain as well as pain inside of her bilateral calves and right lateral ribs.  C-collar was applied prior to arrival.  Patient currently complains of neck pain due to the c-collar.  She thinks that she hit her head when she fell but did not sustain any lacerations.  No reported loss of consciousness.  Patient denies associated chest pain, shortness of breath.  She states that she falls a lot.  She has a life alert button to use if she falls.  She denies any recent medical illnesses.        Past Medical History:  Diagnosis Date  . Anemia   . Arthritis    osteoarthritis - knees and right shoulder  . Atrial fibrillation [facility])    ablation- 2x's-- 1st time- [facility], 2nd event at [facility] in 2008. Convergent ablation at [facility] 6/14  . Atrial fibrillation ([place])   . Blood transfusion without reported diagnosis   . Breast cancer ([place])    Dr Margot Chimes, total thyroidectomy- 1999- for cancer  . 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 Chronic bilateral pleural effusions   . Colon polyp    Dr Earlean Shawl  . Complete heart block ([place])   . Complication of anesthesia    Ketamine produces LSD reaction, bright colored nightmarish experience   . Dyslipidemia   . Dyspnea   . Endometriosis   . Fibroids   .  H/O pleural effusion    s/p thoracentesis w 3224ml withdrawn  . Hepatitis    [place]itis   . History of dysphagia    due to radiation therapy  . History of hiatal hernia    small noted on PET scan  . History of kidney stones   . Hx of thyroid cancer    Dr Forde Dandy  . Hyperlipidemia   . Hypertension   . Hypothyroidism   . Lung cancer, lower lobe ([place]) 01/2017   radiation RX completed 03/04/17; will start chemo 6/27, pt unaware of lung cancer  . Morbid obesity ([place])    Status post lap band surgery  . Nephrolithiasis   . Non Hodgkin's lymphoma ([place])    on chemotherapy  . Persistent atrial fibrillation ([place])    a. s/p PVI 2008 b. s/p convergent ablation 4259 complicated by bradycardia requiring pacemaker implant  . Personal history of radiation therapy   . Presence of permanent cardiac pacemaker   . Rotator cuff tear    Right  . Sinus node dysfunction ([place])    Complicating convergent ablation 6/14  . Stroke [facility])    2003- [place] x2  . SVC syndrome    with lung mass and non hodgkins lymphoma  .  Thyroid cancer [facility]) 2000    Patient Active Problem List   Diagnosis Date Noted  . COVID-19 virus infection 10/12/2020  . Constipation 06/22/2020  . Change in bowel habits 06/22/2020  . Diverticulosis 06/22/2020  . Dark stools 06/22/2020  . Hemorrhoids 06/22/2020  . Pacemaker 01/23/2020  . Junctional rhythm 01/23/2020  . Palpitations 01/23/2020  . ICH (intracerebral hemorrhage) ([place]) 12/02/2019  . A-fib ([place]) 12/02/2019  . Anemia 12/02/2019  . QT prolongation 12/02/2019  . Hypothyroidism 12/02/2019  . Gait abnormality 07/10/2018  . S/P reverse total shoulder arthroplasty, 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  . Persistent atrial fibrillation ([place])   . Bilateral ureteral calculi 11/17/2017  . Atrial fibrillation with RVR ([place]) 09/15/2017  . Atrial flutter ([place]) 07/17/2017  . Port catheter in place 04/02/2017  . Non-Hodgkin lymphoma, unspecified,  intrathoracic lymph nodes ([place]) 02/13/2017  . Mediastinal mass 02/06/2017  . 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 Abnormal chest x-ray   . Lung mass 02/03/2017  . 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  . OSA (obstructive sleep apnea) 02/03/2015  . History of renal calculi 11/16/2014  . Renal calculi 11/16/2014  . Family history of colon cancer 10/26/2014  . Mechanical complication due to cardiac pacemaker pulse generator 11/06/2013  . Dyspnea 05/12/2013  . Hypothyroidism 04/21/2013  . Pleural effusion 04/19/2013  . Long term (current) use of anticoagulants 12/20/2010  . EPIDERMOID CYST 08/22/2010  . Chronic diastolic heart failure ([place]) 08/06/2010  . SYNCOPE AND COLLAPSE 07/27/2010  . OSTEOARTHRITIS, KNEE, RIGHT 03/15/2010  . Morbid obesity ([place]) 12/07/2009  . NONSPEC ELEVATION OF LEVELS OF TRANSAMINASE/LDH 09/08/2009  . BREAST CANCER, HX OF 07/25/2009  . COLONIC POLYPS, HX OF 07/25/2009  . TUBULOVILLOUS ADENOMA, COLON 04/29/2008  . HYPERGLYCEMIA, FASTING 04/29/2008  . HYPERLIPIDEMIA 06/22/2007  . CARCINOMA, THYROID GLAND, HX OF 06/22/2007    Past Surgical History:  Procedure Laterality Date  . ABDOMINAL HYSTERECTOMY  1983  . afib ablation     a. 2008 PVI b. 2014 convergent ablation  . APPENDECTOMY    . BONE MARROW BIOPSY  02/21/2017  . BREAST LUMPECTOMY Left 2010  . bso  1998  . CARDIAC CATHETERIZATION     2015- negative  . CARDIOVERSION  10/09/2012   Procedure: CARDIOVERSION;  Surgeon: Minus Breeding, MD;  Location: [place];  Service: Cardiovascular;  Laterality: N/A;  . CARDIOVERSION  10/09/2012   Procedure: CARDIOVERSION;  Surgeon: Minus Breeding, MD;  Location: [facility] ENDOSCOPY;  Service: Cardiovascular;  Laterality: N/A;  [name] gave the ok to add pt to the add on , but we must check to find out if the can add pt on at [phone] ( [phone])  . CARDIOVERSION N/A 11/20/2012   Procedure: CARDIOVERSION;  Surgeon: Fay Records, MD;  Location: [place];  Service:  Cardiovascular;  Laterality: N/A;  . CARDIOVERSION N/A 07/18/2017   Procedure: CARDIOVERSION;  Surgeon: Thayer Headings, MD;  Location: [facility] ENDOSCOPY;  Service: Cardiovascular;  Laterality: N/A;  . CARDIOVERSION N/A 10/03/2017   Procedure: CARDIOVERSION;  Surgeon: Sanda Klein, MD;  Location: [facility] ENDOSCOPY;  Service: Cardiovascular;  Laterality: N/A;  . CARDIOVERSION N/A 01/07/2018   Procedure: CARDIOVERSION;  Surgeon: Thayer Headings, MD;  Location: [facility] ENDOSCOPY;  Service: Cardiovascular;  Laterality: N/A;  . CARDIOVERSION N/A 12/10/2019   Procedure: CARDIOVERSION;  Surgeon: Buford Dresser, MD;  Location: [facility] ENDOSCOPY;  Service: Cardiovascular;  Laterality: N/A;  . CHOLECYSTECTOMY    . COLONOSCOPY W/ POLYPECTOMY     Dr Earlean Shawl  . CYSTOSCOPY N/A 02/06/2015  Procedure: CYSTOSCOPY;  Surgeon: Kathie Rhodes, MD;  Location: [facility] ORS;  Service: Urology;  Laterality: N/A;  . CYSTOSCOPY W/ RETROGRADES Left 11/17/2017   Procedure: CYSTOSCOPY WITH RETROGRADE /PYELOGRAM/;  Surgeon: Kathie Rhodes, MD;  Location: [facility];  Service: Urology;  Laterality: Left;  . CYSTOSCOPY WITH RETROGRADE PYELOGRAM, URETEROSCOPY AND STENT PLACEMENT Right 02/06/2015   Procedure: RETROGRADE PYELOGRAM, RIGHT URETEROSCOPY STENT PLACEMENT;  Surgeon: Kathie Rhodes, MD;  Location: [facility];  Service: Urology;  Laterality: Right;  . CYSTOSCOPY WITH RETROGRADE PYELOGRAM, URETEROSCOPY AND STENT PLACEMENT Right 03/07/2017   Procedure: CYSTOSCOPY WITH RIGHT RETROGRADE PYELOGRAM,RIGHT  URETEROSCOPYLASER LITHOTRIPSY  AND STENT PLACEMENT AND STONE BASKETRY;  Surgeon: Kathie Rhodes, MD;  Location: [place];  Service: Urology;  Laterality: Right;  . EYE SURGERY     cataract surgery  . fatty mass removal  1999   pubic area  . HOLMIUM LASER APPLICATION N/A 7/74/1287   Procedure: HOLMIUM LASER APPLICATION;  Surgeon: Kathie Rhodes, MD;  Location: [facility];  Service: Urology;  Laterality: N/A;  . HOLMIUM LASER APPLICATION Right  8/67/6720   Procedure: HOLMIUM LASER APPLICATION;  Surgeon: Kathie Rhodes, MD;  Location: [facility];  Service: Urology;  Laterality: Right;  . HOLMIUM LASER APPLICATION Left 9/47/0962   Procedure: HOLMIUM LASER APPLICATION;  Surgeon: Kathie Rhodes, MD;  Location: [facility];  Service: Urology;  Laterality: Left;  . IR FLUORO GUIDE PORT INSERTION RIGHT  02/24/2017  . IR NEPHROSTOMY PLACEMENT RIGHT  11/17/2017  . IR PATIENT EVAL TECH 0-60 MINS  03/11/2017  . IR REMOVAL TUN ACCESS W/ PORT W/O FL MOD SED  04/20/2018  . IR US GUIDE VASC ACCESS RIGHT  02/24/2017  . KNEE ARTHROSCOPY     bilateral  . LAPAROSCOPIC GASTRIC BANDING  07/10/2010  . LAPAROSCOPIC GASTRIC BANDING     Laparoscopic adjustable banding APS System with posterior hiatal hernia, 2 suture.  Marland Kitchen LAPAROTOMY     for ruptured ovary and ovarian artery   . NEPHROLITHOTOMY Right 11/17/2017   Procedure: NEPHROLITHOTOMY PERCUTANEOUS;  Surgeon: Kathie Rhodes, MD;  Location: [facility];  Service: Urology;  Laterality: Right;  . PACEMAKER INSERTION  03/10/2013   MDT dual chamber PPM  . POCKET REVISION N/A 12/08/2013   Procedure: POCKET REVISION;  Surgeon: Deboraha Sprang, MD;  Location: [facility] CATH LAB;  Service: Cardiovascular;  Laterality: N/A;  . PORTA CATH INSERTION    . REVERSE SHOULDER ARTHROPLASTY Right 05/14/2018   Procedure: RIGHT REVERSE SHOULDER ARTHROPLASTY;  Surgeon: Tania Ade, MD;  Location: [place];  Service: Orthopedics;  Laterality: Right;  . REVERSE SHOULDER REPLACEMENT Right 05/14/2018  . RIGHT HEART CATH N/A 07/21/2019   Procedure: RIGHT HEART CATH;  Surgeon: Larey Dresser, MD;  Location: [place] CV LAB;  Service: Cardiovascular;  Laterality: N/A;  . TEE WITH CARDIOVERSION  09/22/2017  . TEE WITHOUT CARDIOVERSION N/A 10/03/2017   Procedure: TRANSESOPHAGEAL ECHOCARDIOGRAM (TEE);  Surgeon: Sanda Klein, MD;  Location: [facility] ENDOSCOPY;  Service: Cardiovascular;  Laterality: N/A;  . TEE WITHOUT CARDIOVERSION N/A  08/04/2019   Procedure: TRANSESOPHAGEAL ECHOCARDIOGRAM (TEE);  Surgeon: Larey Dresser, MD;  Location: [facility] ENDOSCOPY;  Service: Cardiovascular;  Laterality: N/A;  . TEE WITHOUT CARDIOVERSION N/A 12/10/2019   Procedure: TRANSESOPHAGEAL ECHOCARDIOGRAM (TEE);  Surgeon: Buford Dresser, MD;  Location: [facility] ENDOSCOPY;  Service: Cardiovascular;  Laterality: N/A;  . THYROIDECTOMY  1998   Dr Margot Chimes  . TONSILLECTOMY    . TOTAL KNEE ARTHROPLASTY  04/13/2012   Procedure: TOTAL KNEE ARTHROPLASTY;  Surgeon:  Rudean Haskell, MD;  Location: [place];  Service: Orthopedics;  Laterality: Right;  Marland Kitchen VIDEO BRONCHOSCOPY WITH ENDOBRONCHIAL ULTRASOUND N/A 02/07/2017   Procedure: VIDEO BRONCHOSCOPY WITH ENDOBRONCHIAL ULTRASOUND;  Surgeon: Marshell Garfinkel, MD;  Location: [place];  Service: Pulmonary;  Laterality: N/A;     OB History   No obstetric history on file.     Family History  Problem Relation Age of Onset  . Heart disease Father 28       MI $R'@autopsy'gd$   . Colon cancer Father        COLON  . Heart attack Father   . Other Mother        temporal arteritis   . Diabetes Sister   . Diabetes Brother   . Diabetes Paternal Aunt   . Diabetes Paternal Grandmother   . Esophageal cancer Neg Hx   . Inflammatory bowel disease Neg Hx   . Liver disease Neg Hx   . Pancreatic cancer Neg Hx   . Stomach cancer Neg Hx     Social History   Tobacco Use  . Smoking status: Never Smoker  . Smokeless tobacco: Never Used  Vaping Use  . Vaping Use: Never used  Substance Use Topics  . Alcohol use: No    Comment: none since 1990  . Drug use: Never    Home Medications Prior to Admission medications   Medication Sig Start Date End Date Taking? Authorizing Provider  acetaminophen (TYLENOL) 500 MG tablet Take 500-1,000 mg by mouth every 6 (six) hours as needed for mild pain or headache.    [provider]  amiodarone (PACERONE) 200 MG tablet Take 1 tablet (200 mg total) by mouth daily. 03/28/20   Deboraha Sprang, MD  benzonatate (TESSALON) 100 MG capsule Take 1 capsule (100 mg total) by mouth every 6 (six) hours as needed for cough. 10/12/20   Parrett, Fonnie Mu, NP  calcium-vitamin D (OSCAL WITH D) 500-200 MG-UNIT tablet Take 2 tablets by mouth daily with breakfast. Patient not taking: Reported on 10/12/2020 12/05/19   Mercy Riding, MD  cefdinir (OMNICEF) 300 MG capsule Take 1 capsule (300 mg total) by mouth 2 (two) times daily. Patient not taking: Reported on 10/12/2020 09/27/20   Parrett, Fonnie Mu, NP  ELIQUIS 5 MG TABS tablet TAKE 1 TABLET BY MOUTH  TWICE DAILY 06/13/20   Deboraha Sprang, MD  furosemide (LASIX) 20 MG tablet Take 20 mg by mouth daily as needed for fluid or edema.  07/21/19   [provider]  levothyroxine (SYNTHROID) 137 MCG tablet Take 137 mcg by mouth See admin instructions. TAKE 1 TABLET (137 MCG) BY MOUTH DAILY, EXCEPT SATURDAYS    [provider]  Lifitegrast 5 % SOLN Place 1 drop into both eyes at bedtime.     [provider]  Multiple Vitamins-Minerals (ONE-A-DAY WOMENS 50+ ADVANTAGE) TABS Take 1 tablet by mouth daily with breakfast.    [provider]  rosuvastatin (CRESTOR) 10 MG tablet Take 10 mg by mouth 2 (two) times a week. Takes on Monday and Thursday 06/30/18   [provider]  senna (SENOKOT) 8.6 MG tablet Take 1 tablet by mouth as needed for constipation.    [provider]    Allergies    Dofetilide, Ranolazine, Rivaroxaban, Rivaroxaban, Tape, Tikosyn [dofetilide], Epinephrine, Epinephrine, Ketamine, Adhesive [tape], and Ketamine  Review of Systems   Review of Systems  Constitutional: Negative for fever.  HENT: Negative for rhinorrhea and sore throat.   Eyes:  Negative for redness.  Respiratory: Negative for cough.   Cardiovascular: Positive for chest pain (rib pain).  Gastrointestinal: Negative for abdominal pain, diarrhea, nausea and vomiting.  Genitourinary: Negative for dysuria, frequency, hematuria and urgency.   Musculoskeletal: Positive for arthralgias (hip and calf pain), myalgias (calf pain) and neck pain.  Skin: Negative for rash.  Neurological: Negative for headaches.    Physical Exam Updated Vital Signs BP (!) 176/83   Pulse 72   Temp 98.5 F (36.9 C) (Oral)   Resp 19   Ht $R'5\' 6"'Of$  (1.676 m)   Wt 94 kg   SpO2 98%   BMI 33.45 kg/m   Physical Exam Vitals and nursing note reviewed.  Constitutional:      General: She is not in acute distress.    Appearance: She is well-developed.  HENT:     Head: Normocephalic and atraumatic.     Right Ear: Ear canal and external ear normal.     Left Ear: Ear canal and external ear normal.     Nose: Nose normal.  Eyes:     Conjunctiva/sclera: Conjunctivae normal.  Neck:     Comments: C-collar in place Cardiovascular:     Rate and Rhythm: Normal rate and regular rhythm.     Heart sounds: Murmur (lower LSB) heard.    Pulmonary:     Effort: No respiratory distress.     Breath sounds: No wheezing, rhonchi or rales.  Chest:     Chest wall: Tenderness (right lateral ribs) present.     Comments: Tender right lateral ribs without ecchymosis or deformity Abdominal:     Palpations: Abdomen is soft.     Tenderness: There is no abdominal tenderness. There is no guarding or rebound.  Musculoskeletal:     Right shoulder: No tenderness. Normal range of motion.     Left shoulder: No tenderness. Normal range of motion.     Right upper arm: No tenderness.     Left upper arm: No tenderness.     Right elbow: No swelling. Normal range of motion. No tenderness.     Left elbow: No swelling. Normal range of motion. Tenderness present.     Right forearm: No tenderness.     Left forearm: No tenderness.     Right hip: No tenderness or bony tenderness. Normal range of motion.     Left hip: Tenderness and bony tenderness present. Decreased range of motion.     Right knee: Normal range of motion.     Left knee: Normal range of motion.     Right lower leg: No  edema.     Left lower leg: No edema.     Comments: Extensive venous stasis changes and varicosities of the lower extremities.  Patient has tenderness to the medial calves bilaterally without any skin findings of trauma.  Patient does actively range her left hip in slight flexion.  No foreshortening or external rotation.  Skin:    General: Skin is warm and dry.     Findings: No rash.  Neurological:     General: No focal deficit present.     Mental Status: She is alert. Mental status is at baseline.     Motor: No weakness.  Psychiatric:        Mood and Affect: Mood normal.     ED Results / Procedures / Treatments   Labs (all labs ordered are listed, but only abnormal results are displayed) Labs Reviewed  CBC WITH DIFFERENTIAL/PLATELET  BASIC METABOLIC PANEL  URINALYSIS, ROUTINE W REFLEX MICROSCOPIC    EKG None  Radiology CT Head Wo Contrast  Result Date: 12/15/2020 CLINICAL DATA:  Fall, on anticoagulation EXAM: CT HEAD WITHOUT CONTRAST CT CERVICAL SPINE WITHOUT CONTRAST TECHNIQUE: Multidetector CT imaging of the head and cervical spine was performed following the standard protocol without intravenous contrast. Multiplanar CT image reconstructions of the cervical spine were also generated. COMPARISON:  MR head and cervical spine 07/29/2018, CT head and cervical spine 12/02/2018 FINDINGS: CT HEAD FINDINGS Brain: No evidence of acute infarction, hemorrhage, hydrocephalus, extra-axial collection, visible mass lesion or mass effect. Symmetric prominence of the ventricles, cisterns and sulci compatible with parenchymal volume loss. Mixed patchy and confluent areas of white matter hypoattenuation are most compatible with chronic microvascular angiopathy. Vascular: Atherosclerotic calcification of the carotid siphons and intradural vertebral arteries. No hyperdense vessel. Skull: No calvarial fracture or suspicious osseous lesion. No scalp swelling or hematoma. Sinuses/Orbits: Paranasal sinuses  and mastoid air cells are predominantly clear. Orbital structures are unremarkable aside from prior lens extractions. Other: Bilateral TMJ arthrosis, right greater than left. CT CERVICAL SPINE FINDINGS Alignment: Stabilization collar in place at the time of exam. Straightening of the normal cervical lordosis. Focal reversal noted at the C5-6 level. No evidence of traumatic listhesis. No abnormally widened, perched or jumped facets. Normal alignment of the craniocervical and atlantoaxial articulations accounting for cranial positioning and atlantodental arthrosis. Skull base and vertebrae: No acute skull base fracture. No vertebral body fracture or height loss. The osseous structures appear diffusely demineralized which may limit detection of small or nondisplaced fractures. Stable appearance of slightly more focal lucency in the C3 and T1 posterosuperior corner, likely vertebral body hemangiomata or demineralization without concerning features on comparison MR imaging. Moderate arthrosis at the atlantodental and basion dens intervals, stable prior. Multilevel cervical spondylitic changes, as detailed below. Soft tissues and spinal canal: No pre or paravertebral fluid or swelling. No visible canal hematoma. Airways patent. Disc levels: Multilevel intervertebral disc height loss with spondylitic endplate changes. Features maximal C5-C7 with larger disc osteophyte complexes at the C5-6, C6-7 levels resulting and moderate canal stenoses. Additional uncinate spurring facet hypertrophic changes throughout the cervical spine result in mild multilevel neural foraminal narrowing. Upper chest: Biapical pleuroparenchymal scarring is noted. Postsurgical changes at the base of the neck likely reflecting prior thyroidectomy. Calcification nodes. Other: None. IMPRESSION: 1. No acute intracranial abnormality. No scalp swelling or calvarial fracture. 2. Stable parenchymal volume loss and chronic microvascular ischemic white matter  disease. 3. No acute cervical spine fracture or traumatic listhesis. 4. Multilevel cervical spondylitic and facet degenerative changes, maximal C5-C7 where there is moderate canal stenoses and mild multilevel neural foraminal narrowing. 5. Stable appearance of slightly more focal lucency in the C3 and T1 posterosuperior corner, likely vertebral body hemangiomata or demineralization without concerning features on interval comparison MR imaging. Electronically Signed   By: Kreg Shropshire M.D.   On: 12/15/2020 06:36   CT Cervical Spine Wo Contrast  Result Date: 12/15/2020 CLINICAL DATA:  Fall, on anticoagulation EXAM: CT HEAD WITHOUT CONTRAST CT CERVICAL SPINE WITHOUT CONTRAST TECHNIQUE: Multidetector CT imaging of the head and cervical spine was performed following the standard protocol without intravenous contrast. Multiplanar CT image reconstructions of the cervical spine were also generated. COMPARISON:  MR head and cervical spine 07/29/2018, CT head and cervical spine 12/02/2018 FINDINGS: CT HEAD FINDINGS Brain: No evidence of acute infarction, hemorrhage, hydrocephalus, extra-axial collection, visible mass lesion or mass effect. Symmetric prominence of the ventricles, cisterns and sulci compatible  with parenchymal volume loss. Mixed patchy and confluent areas of white matter hypoattenuation are most compatible with chronic microvascular angiopathy. Vascular: Atherosclerotic calcification of the carotid siphons and intradural vertebral arteries. No hyperdense vessel. Skull: No calvarial fracture or suspicious osseous lesion. No scalp swelling or hematoma. Sinuses/Orbits: Paranasal sinuses and mastoid air cells are predominantly clear. Orbital structures are unremarkable aside from prior lens extractions. Other: Bilateral TMJ arthrosis, right greater than left. CT CERVICAL SPINE FINDINGS Alignment: Stabilization collar in place at the time of exam. Straightening of the normal cervical lordosis. Focal reversal  noted at the C5-6 level. No evidence of traumatic listhesis. No abnormally widened, perched or jumped facets. Normal alignment of the craniocervical and atlantoaxial articulations accounting for cranial positioning and atlantodental arthrosis. Skull base and vertebrae: No acute skull base fracture. No vertebral body fracture or height loss. The osseous structures appear diffusely demineralized which may limit detection of small or nondisplaced fractures. Stable appearance of slightly more focal lucency in the C3 and T1 posterosuperior corner, likely vertebral body hemangiomata or demineralization without concerning features on comparison MR imaging. Moderate arthrosis at the atlantodental and basion dens intervals, stable prior. Multilevel cervical spondylitic changes, as detailed below. Soft tissues and spinal canal: No pre or paravertebral fluid or swelling. No visible canal hematoma. Airways patent. Disc levels: Multilevel intervertebral disc height loss with spondylitic endplate changes. Features maximal C5-C7 with larger disc osteophyte complexes at the C5-6, C6-7 levels resulting and moderate canal stenoses. Additional uncinate spurring facet hypertrophic changes throughout the cervical spine result in mild multilevel neural foraminal narrowing. Upper chest: Biapical pleuroparenchymal scarring is noted. Postsurgical changes at the base of the neck likely reflecting prior thyroidectomy. Calcification nodes. Other: None. IMPRESSION: 1. No acute intracranial abnormality. No scalp swelling or calvarial fracture. 2. Stable parenchymal volume loss and chronic microvascular ischemic white matter disease. 3. No acute cervical spine fracture or traumatic listhesis. 4. Multilevel cervical spondylitic and facet degenerative changes, maximal C5-C7 where there is moderate canal stenoses and mild multilevel neural foraminal narrowing. 5. Stable appearance of slightly more focal lucency in the C3 and T1 posterosuperior  corner, likely vertebral body hemangiomata or demineralization without concerning features on interval comparison MR imaging. Electronically Signed   By: Lovena Le M.D.   On: 12/15/2020 06:36    Procedures Procedures   Medications Ordered in ED Medications  morphine 4 MG/ML injection 4 mg (has no administration in time range)  ondansetron (ZOFRAN) injection 4 mg (has no administration in time range)    ED Course  I have reviewed the triage vital signs and the nursing notes.  Pertinent labs & imaging results that were available during my care of the patient were reviewed by me and considered in my medical decision making (see chart for details).  Patient seen and examined. Work-up initiated. Medications ordered.   Vital signs reviewed and are as follows: BP (!) 176/83   Pulse 72   Temp 98.5 F (36.9 C) (Oral)   Resp 19   Ht $R'5\' 6"'VN$  (1.676 m)   Wt 94 kg   SpO2 98%   BMI 33.45 kg/m   6:30 AM Signout to [employer] at shift change pending results of work-up. EKG reviewed. H/o QT prolongation, none today.       MDM Rules/Calculators/A&P                          Pending completion of evaluation.   Final Clinical Impression(s) / ED Diagnoses  Final diagnoses:  None    Rx / DC Orders ED Discharge Orders    None       Carlisle Cater, PA-C 12/15/20 [id number]    Margette Fast, MD 12/15/20 [phone]

## 2020-12-15 NOTE — Progress Notes (Signed)
Orthopedic Tech Progress Note Patient Details:  Kayla Harrison 10-08-1945 [id number]  Ortho Devices Type of Ortho Device: Post (long arm) splint Ortho Device/Splint Location: LUE Ortho Device/Splint Interventions: Ordered,Application,Adjustment   Post Interventions Patient Tolerated: Well Instructions Provided: Care of device,Poper ambulation with device   [name] 12/15/2020, 12:40 PM

## 2020-12-15 NOTE — H&P (Addendum)
History and Physical    Kayla Harrison DHR:[id number] DOB: 04-05-1946 DOA: 12/15/2020  PCP: [name], MD Consultants:  Ander Slade - pulmonology; Lindi Adie - oncology; Caryl Comes - cardiology; Mansouraty - GI Patient coming from:  Home - lives alone; NOK: Younger sister, Kayla Harrison, [phone]   Chief Complaint: Fall  HPI: Kayla Harrison is a 75 y.o. female with medical history significant of thyroid cancer/lung cancer/NHL with h/o SVC syndrome; CVA; pacemaker placement; afib on Eliquis; obesity s/p lap band surgery; hypothyroidism; HTN; and HLD presenting with a fall.  "It's a complicated story, which is why I wanted to be admitted".  She has been very unsteady.  Fell 2 weeks ago by the garbage can.  [place] again a week later, twisted her foot and fell into the recliner.  She has been getting weaker.  She went to ortho and was told she sprained her foot; she was given steroids and finished these Wednesday.  That AM, she started having severe pain on her B shins.  She has lymphedema and has been using a leg pump - ?related.  She started getting very weak yesterday and in the afternoon she was having significant trouble walking.  She felt like she had no energy and had difficulty getting up with her walker.  She had to urinate overnight and fell when she tried to stand, slid into the floor.   She got up again about 4AM and she was too weak to walk once again.  She fell against the bookcase and damaged her hip, elbow, and ribs.  She has had a "wicked headache" for 2 days.  She had prior CVA in 2002, but is taking Eliquis.  Her inability to think, act yesterday was frustrating yesterday.  No dysphagia or dysarthria.  B weakness.  She has a biochemistry and medical technology background and so "has significant medical knowledge and understands medical problems" better than most people.  Headache is in the mid frontal region, much better today.  +neck stiffness.  Ortho did xrays and saw "4 fused vertebrae in her  neck and 3-4 in her lower back."  She has a lot of pain in her neck (chronic) and low back (new).  Her sister acknowledges that her weakness is more subacute than the patient reports and also has concerns about cognitive impairment.    ED Course:  [place] this AM, on Eliquis.  Elbow fracture, massive L hip hematoma.  CT without acute hip fracture.  R rib fractures?  Needs PT/OT.  Splinting elbow fracture, non-displaced, no ortho consulted.  Pain controlled currently.  Review of Systems: As per HPI; otherwise review of systems reviewed and negative.   Ambulatory Status:  Ambulates with a walker  COVID Vaccine Status:   Complete plus booster  Past Medical History:  Diagnosis Date  . Anemia   . Arthritis    osteoarthritis - knees and right shoulder  . Blood transfusion without reported diagnosis   . Breast cancer ([place])    Dr Margot Chimes, total thyroidectomy- 1999- for cancer  . 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 Chronic bilateral pleural effusions   . Colon polyp    Dr Earlean Shawl  . Complication of anesthesia    Ketamine produces LSD reaction, bright colored nightmarish experience   . Dyslipidemia   . Endometriosis   . Fibroids   . H/O pleural effusion    s/p thoracentesis w 3287m withdrawn  . Hepatitis    [place] ?hepatitis   . History of dysphagia  due to radiation therapy  . History of hiatal hernia    small noted on PET scan  . Hypertension   . Hypothyroidism   . Lung cancer, lower lobe ([place]) 01/2017   radiation RX completed 03/04/17; will start chemo 6/27, pt unaware of lung cancer  . Morbid obesity ([place])    Status post lap band surgery  . Nephrolithiasis   . Non Hodgkin's lymphoma ([place])    on chemotherapy  . Persistent atrial fibrillation ([place])    a. s/p PVI 2008 b. s/p convergent ablation 5681 complicated by bradycardia requiring pacemaker implant  . Personal history of radiation therapy   . Presence of permanent cardiac pacemaker   . Rotator cuff  tear    Right  . Stroke [facility])    2003- [place] x2  . SVC syndrome    with lung mass and non hodgkins lymphoma  . Thyroid cancer ([place]) 2000    Past Surgical History:  Procedure Laterality Date  . ABDOMINAL HYSTERECTOMY  1983  . afib ablation     a. 2008 PVI b. 2014 convergent ablation  . APPENDECTOMY    . BONE MARROW BIOPSY  02/21/2017  . BREAST LUMPECTOMY Left 2010  . bso  1998  . CARDIAC CATHETERIZATION     2015- negative  . CARDIOVERSION  10/09/2012   Procedure: CARDIOVERSION;  Surgeon: Minus Breeding, MD;  Location: [place];  Service: Cardiovascular;  Laterality: N/A;  . CARDIOVERSION  10/09/2012   Procedure: CARDIOVERSION;  Surgeon: Minus Breeding, MD;  Location: [facility] ENDOSCOPY;  Service: Cardiovascular;  Laterality: N/A;  [name] gave the ok to add pt to the add on , but we must check to find out if the can add pt on at [phone] ( [phone])  . CARDIOVERSION N/A 11/20/2012   Procedure: CARDIOVERSION;  Surgeon: Fay Records, MD;  Location: [employer];  Service: Cardiovascular;  Laterality: N/A;  . CARDIOVERSION N/A 07/18/2017   Procedure: CARDIOVERSION;  Surgeon: Thayer Headings, MD;  [facility] END[facility]COPY;  Service: Cardiovascular;  Laterality: N/A;  . CARDIOVERSION N/A 10/03/2017   Procedure: CARDIOVERSION;  Surgeon: Sanda Klein, MD;  Location: [facility] ENDOSCOPY;  Service: Cardiovascular;  Laterality: N/A;  . CARDIOVERSION N/A 01/07/2018   Procedure: CARDIOVERSION;  Surgeon: Thayer Headings, MD;  Location: [facility] ENDOSCOPY;  Service: Cardiovascular;  Laterality: N/A;  . CARDIOVERSION N/A 12/10/2019   Procedure: CARDIOVERSION;  Surgeon: Buford Dresser, MD;  Location: [facility] ENDOSCOPY;  Service: Cardiovascular;  Laterality: N/A;  . CHOLECYSTECTOMY    . COLONOSCOPY W/ POLYPECTOMY     Dr Earlean Shawl  . CYSTOSCOPY N/A 02/06/2015   Procedure: CYSTOSCOPY;  Surgeon: Kathie Rhodes, MD;  Location: [facility] ORS;  Service: Urology;  Laterality: N/A;  . CYSTOSCOPY W/ RETROGRADES Left 11/17/2017   Procedure:  CYSTOSCOPY WITH RETROGRADE /PYELOGRAM/;  Surgeon: Kathie Rhodes, MD;  Location: [facility];  Service: Urology;  Laterality: Left;  . CYSTOSCOPY WITH RETROGRADE PYELOGRAM, URETEROSCOPY AND STENT PLACEMENT Right 02/06/2015   Procedure: RETROGRADE PYELOGRAM, RIGHT URETEROSCOPY STENT PLACEMENT;  Surgeon: Kathie Rhodes, MD;  Location: [facility];  Service: Urology;  Laterality: Right;  . CYSTOSCOPY WITH RETROGRADE PYELOGRAM, URETEROSCOPY AND STENT PLACEMENT Right 03/07/2017   Procedure: CYSTOSCOPY WITH RIGHT RETROGRADE PYELOGRAM,RIGHT  URETEROSCOPYLASER LITHOTRIPSY  AND STENT PLACEMENT AND STONE BASKETRY;  Surgeon: Kathie Rhodes, MD;  Location: [place];  Service: Urology;  Laterality: Right;  . EYE SURGERY     cataract surgery  . fatty mass removal  1999   pubic area  . HOLMIUM LASER  APPLICATION N/A 11/21/6008   Procedure: HOLMIUM LASER APPLICATION;  Surgeon: Kathie Rhodes, MD;  Location: [facility];  Service: Urology;  Laterality: N/A;  . HOLMIUM LASER APPLICATION Right 9/32/3557   Procedure: HOLMIUM LASER APPLICATION;  Surgeon: Kathie Rhodes, MD;  Location[facility];  Service: Urology;  Laterality: Right;  . HOLMIUM LASER APPLICATION Left 12/12/252   Procedure: HOLMIUM LASER APPLICATION;  Surgeon: Kathie Rhodes, MD;  Location: [facility];  Service: Urology;  Laterality: Left;  . IR FLUORO GUIDE PORT INSERTION RIGHT  02/24/2017  . IR NEPHROSTOMY PLACEMENT RIGHT  11/17/2017  . IR PATIENT EVAL TECH 0-60 MINS  03/11/2017  . IR REMOVAL TUN ACCESS W/ PORT W/O FL MOD SED  04/20/2018  . IR US GUIDE VASC ACCESS RIGHT  02/24/2017  . KNEE ARTHROSCOPY     bilateral  . LAPAROSCOPIC GASTRIC BANDING  07/10/2010  . LAPAROSCOPIC GASTRIC BANDING     Laparoscopic adjustable banding APS System with posterior hiatal hernia, 2 suture.  Marland Kitchen LAPAROTOMY     for ruptured ovary and ovarian artery   . NEPHROLITHOTOMY Right 11/17/2017   Procedure: NEPHROLITHOTOMY PERCUTANEOUS;  Surgeon: Kathie Rhodes, MD;   Location: [facility];  Service: Urology;  Laterality: Right;  . PACEMAKER INSERTION  03/10/2013   MDT dual chamber PPM  . POCKET REVISION N/A 12/08/2013   Procedure: POCKET REVISION;  Surgeon: Deboraha Sprang, MD;  Location: [facility] CATH LAB;  Service: Cardiovascular;  Laterality: N/A;  . PORTA CATH INSERTION    . REVERSE SHOULDER ARTHROPLASTY Right 05/14/2018   Procedure: RIGHT REVERSE SHOULDER ARTHROPLASTY;  Surgeon: Tania Ade, MD;  Location: [place];  Service: Orthopedics;  Laterality: Right;  . REVERSE SHOULDER REPLACEMENT Right 05/14/2018  . RIGHT HEART CATH N/A 07/21/2019   Procedure: RIGHT HEART CATH;  Surgeon: Larey Dresser, MD;  Location: [place] CV LAB;  Service: Cardiovascular;  Laterality: N/A;  . TEE WITH CARDIOVERSION  09/22/2017  . TEE WITHOUT CARDIOVERSION N/A 10/03/2017   Procedure: TRANSESOPHAGEAL ECHOCARDIOGRAM (TEE);  Surgeon: Sanda Klein, MD;  Location: [facility] ENDOSCOPY;  Service: Cardiovascular;  Laterality: N/A;  . TEE WITHOUT CARDIOVERSION N/A 08/04/2019   Procedure: TRANSESOPHAGEAL ECHOCARDIOGRAM (TEE);  Surgeon: Larey Dresser, MD;  Location: [facility] ENDOSCOPY;  Service: Cardiovascular;  Laterality: N/A;  . TEE WITHOUT CARDIOVERSION N/A 12/10/2019   Procedure: TRANSESOPHAGEAL ECHOCARDIOGRAM (TEE);  Surgeon: Buford Dresser, MD;  Location: [facility] ENDOSCOPY;  Service: Cardiovascular;  Laterality: N/A;  . THYROIDECTOMY  1998   Dr Margot Chimes  . TONSILLECTOMY    . TOTAL KNEE ARTHROPLASTY  04/13/2012   Procedure: TOTAL KNEE ARTHROPLASTY;  Surgeon: Rudean Haskell, MD;  Location: [place];  Service: Orthopedics;  Laterality: Right;  Marland Kitchen VIDEO BRONCHOSCOPY WITH ENDOBRONCHIAL ULTRASOUND N/A 02/07/2017   Procedure: VIDEO BRONCHOSCOPY WITH ENDOBRONCHIAL ULTRASOUND;  Surgeon: Marshell Garfinkel, MD;  Location: [place];  Service: Pulmonary;  Laterality: N/A;    Social History   Socioeconomic History  . Marital status: Single    Spouse name: Not on file  . Number of children: 0  . Years of  education: Not on file  . Highest education level: Master's degree (e.g., MA, MS, MEng, MEd, MSW, MBA)  Occupational History  . 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 Smoking status: Never Smoker  . Smokeless tobacco: Never Used  Vaping Use  . Vaping Use: Never used  Substance and Sexual Activity  . Alcohol use: No    Comment: none since 1990  .  Drug use: Never  . Sexual activity: Not Currently    Birth control/protection: Surgical  Other Topics Concern  . Not on file  Social History Narrative   ** Merged History Encounter **       SINGLE  NO CHILDREN NEVER SMOKED EXERCISE 3 X WK RETIRED NO CAFFEINE      Social Determinants of Health   Financial Resource Strain: Not on file  Food Insecurity: Not on file  Transportation Needs: Not on file  Physical Activity: Not on file  Stress: Not on file  Social Connections: Not on file  Intimate Partner Violence: Not on file    Allergies  Allergen Reactions  . Dofetilide Other (See Comments)    Prolonged QT interval   . Ranolazine     Balance issues  . Rivaroxaban Other (See Comments)    Nose Bleed X 6 hrs ; packing in ER Nasal hemorrhage  . Rivaroxaban     Nose Bleed X 6 hrs ; packing in ER Nasal hemorrhage  . Tape Other (See Comments)    SKIN IS THIN AND TEARS EASILY   . Tikosyn [Dofetilide] Other (See Comments)    "Long QT wave"   . Epinephrine Other (See Comments)    Oral anesthetic Dental form only (liquid). Patient stated she will become "out of it" she can hear you but cannot respond in normal fashion. "not fully with it"  . Epinephrine   . Ketamine Other (See Comments)    Hallucinations   . Adhesive [Tape] Rash    Paper tape as well  . Ketamine Other (See Comments)    HALLUCINATIONS    Family History  Problem Relation Age of Onset  . Heart disease Father 60       MI _0   . Colon cancer Father        COLON  . Heart attack Father   .  Other Mother        temporal arteritis   . Diabetes Sister   . Diabetes Brother   . Diabetes Paternal Aunt   . Diabetes Paternal Grandmother   . Esophageal cancer Neg Hx   . Inflammatory bowel disease Neg Hx   . Liver disease Neg Hx   . Pancreatic cancer Neg Hx   . Stomach cancer Neg Hx     Prior to Admission medications   Medication Sig Start Date End Date Taking? Authorizing Provider  acetaminophen (TYLENOL) 500 MG tablet Take 500-1,000 mg by mouth every 6 (six) hours as needed for mild pain or headache.   Yes [provider]  amiodarone (PACERONE) 200 MG tablet Take 1 tablet (200 mg total) by mouth daily. 03/28/20  Yes Deboraha Sprang, MD  calcium-vitamin D (OSCAL WITH D) 500-200 MG-UNIT tablet Take 2 tablets by mouth daily with breakfast. Patient taking differently: Take 1 tablet by mouth daily with breakfast. 12/05/19  Yes Gonfa, Charlesetta Ivory, MD  ELIQUIS 5 MG TABS tablet TAKE 1 TABLET BY MOUTH  TWICE DAILY Patient taking differently: Take 5 mg by mouth 2 (two) times daily. 06/13/20  Yes Deboraha Sprang, MD  furosemide (LASIX) 20 MG tablet Take 20 mg by mouth daily as needed for fluid or edema.  07/21/19  Yes [provider]  levothyroxine (SYNTHROID) 137 MCG tablet Take 68.5-137 mcg by mouth See admin instructions. Taking 137 mcg tablet daily except on Saturday taking 1/2 tablet   Yes [provider]  Lifitegrast 5 % SOLN Place 1 drop into both eyes at bedtime.  Yes [provider]  Multiple Vitamins-Minerals (ONE-A-DAY WOMENS 50+ ADVANTAGE) TABS Take 1 tablet by mouth daily with breakfast.   Yes [provider]  rosuvastatin (CRESTOR) 10 MG tablet Take 10 mg by mouth 2 (two) times a week. Takes on Monday and Thursday 06/30/18  Yes [provider]  senna (SENOKOT) 8.6 MG tablet Take 1 tablet by mouth as needed for constipation.   Yes [provider]  benzonatate (TESSALON) 100 MG capsule Take 1 capsule (100 mg total) by mouth  every 6 (six) hours as needed for cough. Patient not taking: No sig reported 10/12/20   Parrett, Tammy S, NP  cefdinir (OMNICEF) 300 MG capsule Take 1 capsule (300 mg total) by mouth 2 (two) times daily. Patient not taking: No sig reported 09/27/20   Melvenia Needles, NP    Physical Exam: Vitals:   12/15/20 1115 12/15/20 1200 12/15/20 1257 12/15/20 1633  BP: 137/76 139/71  117/67  Pulse: 69 70  70  Resp: _0 Temp:  98.7 F (37.1 C)  98 F (36.7 C)  TempSrc:  Oral  Oral  SpO2: 98% 94%  97%  Weight:   92.5 kg   Height:   _1  (1.676 m)      . General:  Appears calm and comfortable and is in NAD, very conversant . Eyes:  PERRL, EOMI, normal lids, iris . ENT:  grossly normal hearing, lips & tongue, mmm . Neck:  no LAD, masses or thyromegaly . Cardiovascular:  RRR, no m/r/g. No LE edema.  Marland Kitchen Respiratory:   CTA bilaterally with no wheezes/rales/rhonchi.  Normal respiratory effort. . Abdomen:  soft, NT, ND, NABS . Back:   normal alignment, no CVAT  . Skin:  no rash or induration seen on limited exam . Musculoskeletal:  Large L hip buttock ecchymosis with underlying hematoma; no visible R rib injury; able to use L arm to bear weight despite fractre . Lower extremity:  No LE edema.  Limited foot exam with no ulcerations.  2+ distal pulses. Marland Kitchen Psychiatric:  grossly normal mood and affect, speech fluent and appropriate, AOx3 . Neurologic:  CN 2-12 grossly intact, moves all extremities in coordinated fashion    Radiological Exams on Admission: Independently reviewed - see discussion in A/P where applicable  DG Ribs Unilateral W/Chest Right  Result Date: 12/15/2020 CLINICAL DATA:  Fall. EXAM: RIGHT RIBS AND CHEST - 3+ VIEW COMPARISON:  10/12/2020 FINDINGS: Cardiomegaly. Dual-chamber pacer leads in place. Left atrial clipping. Interstitial prominence which is stable. There is paramediastinal scarring on a 2021 chest CT. No hemothorax or pneumothorax. Lateral right seventh and eighth  rib fractures which are nondisplaced and marked on the images. Reverse glenohumeral arthroplasty on the right. Lap band. Postoperative thoracic inlet and left breast. IMPRESSION: Nondisplaced right seventh and eighth rib fractures. No hemothorax or pneumothorax. Electronically Signed   By: Monte Fantasia M.D.   On: 12/15/2020 06:52   DG Elbow Complete Left  Result Date: 12/15/2020 CLINICAL DATA:  Fall EXAM: LEFT ELBOW - COMPLETE 3+ VIEW COMPARISON:  None. FINDINGS: The osseous structures appear diffusely demineralized which may limit detection of small or nondisplaced fractures. Nondisplaced fracture through the coronoid process. Questionable lucency through the lateral aspect of the radial head. Degenerative changes about the elbow with enthesopathic changes on the medial epicondyle. Vascular calcium the soft tissues. Soft tissue swelling of the elbow is present. IMPRESSION: 1. Nondisplaced fracture through the coronoid process. 2. Questionable lucency versus projection over the lateral  aspect of the radial head, correlate for point tenderness to exclude acute fracture. 3. Degenerative changes about the elbow with enthesopathic changes on the medial epicondyle. 4. Minimal soft tissue swelling, no sizeable effusion. Electronically Signed   By: Lovena Le M.D.   On: 12/15/2020 06:57   CT Head Wo Contrast  Result Date: 12/15/2020 CLINICAL DATA:  Fall, on anticoagulation EXAM: CT HEAD WITHOUT CONTRAST CT CERVICAL SPINE WITHOUT CONTRAST TECHNIQUE: Multidetector CT imaging of the head and cervical spine was performed following the standard protocol without intravenous contrast. Multiplanar CT image reconstructions of the cervical spine were also generated. COMPARISON:  MR head and cervical spine 07/29/2018, CT head and cervical spine 12/02/2018 FINDINGS: CT HEAD FINDINGS Brain: No evidence of acute infarction, hemorrhage, hydrocephalus, extra-axial collection, visible mass lesion or mass effect. Symmetric  prominence of the ventricles, cisterns and sulci compatible with parenchymal volume loss. Mixed patchy and confluent areas of white matter hypoattenuation are most compatible with chronic microvascular angiopathy. Vascular: Atherosclerotic calcification of the carotid siphons and intradural vertebral arteries. No hyperdense vessel. Skull: No calvarial fracture or suspicious osseous lesion. No scalp swelling or hematoma. Sinuses/Orbits: Paranasal sinuses and mastoid air cells are predominantly clear. Orbital structures are unremarkable aside from prior lens extractions. Other: Bilateral TMJ arthrosis, right greater than left. CT CERVICAL SPINE FINDINGS Alignment: Stabilization collar in place at the time of exam. Straightening of the normal cervical lordosis. Focal reversal noted at the C5-6 level. No evidence of traumatic listhesis. No abnormally widened, perched or jumped facets. Normal alignment of the craniocervical and atlantoaxial articulations accounting for cranial positioning and atlantodental arthrosis. Skull base and vertebrae: No acute skull base fracture. No vertebral body fracture or height loss. The osseous structures appear diffusely demineralized which may limit detection of small or nondisplaced fractures. Stable appearance of slightly more focal lucency in the C3 and T1 posterosuperior corner, likely vertebral body hemangiomata or demineralization without concerning features on comparison MR imaging. Moderate arthrosis at the atlantodental and basion dens intervals, stable prior. Multilevel cervical spondylitic changes, as detailed below. Soft tissues and spinal canal: No pre or paravertebral fluid or swelling. No visible canal hematoma. Airways patent. Disc levels: Multilevel intervertebral disc height loss with spondylitic endplate changes. Features maximal C5-C7 with larger disc osteophyte complexes at the C5-6, C6-7 levels resulting and moderate canal stenoses. Additional uncinate spurring  facet hypertrophic changes throughout the cervical spine result in mild multilevel neural foraminal narrowing. Upper chest: Biapical pleuroparenchymal scarring is noted. Postsurgical changes at the base of the neck likely reflecting prior thyroidectomy. Calcification nodes. Other: None. IMPRESSION: 1. No acute intracranial abnormality. No scalp swelling or calvarial fracture. 2. Stable parenchymal volume loss and chronic microvascular ischemic white matter disease. 3. No acute cervical spine fracture or traumatic listhesis. 4. Multilevel cervical spondylitic and facet degenerative changes, maximal C5-C7 where there is moderate canal stenoses and mild multilevel neural foraminal narrowing. 5. Stable appearance of slightly more focal lucency in the C3 and T1 posterosuperior corner, likely vertebral body hemangiomata or demineralization without concerning features on interval comparison MR imaging. Electronically Signed   By: Lovena Le M.D.   On: 12/15/2020 06:36   CT Chest Wo Contrast  Result Date: 12/15/2020 CLINICAL DATA:  Rib fractures.  History of non-Hodgkin's lymphoma. EXAM: CT CHEST WITHOUT CONTRAST TECHNIQUE: Multidetector CT imaging of the chest was performed following the standard protocol without IV contrast. COMPARISON:  August 21, 2020. FINDINGS: Cardiovascular: Atherosclerosis of thoracic aorta is noted without aneurysm formation. Normal cardiac size. No pericardial  effusion. Left-sided pacemaker is in grossly good position. Coronary artery calcifications are noted. Mediastinum/Nodes: Small sliding-type hiatal hernia. Thyroid gland is unremarkable. No definite adenopathy is noted, although evaluation is significantly limited due the lack of intravenous contrast. Lungs/Pleura: No pneumothorax or significant pleural effusion is noted. Mild bilateral posterior basilar subsegmental atelectasis is noted. Probable scarring or fibrosis is noted in the right upper lobe. Upper Abdomen: Lap band device is  noted. Bilateral nonobstructive nephrolithiasis is noted. Musculoskeletal: No chest wall mass or suspicious bone lesions identified. Status post right total shoulder arthroplasty. IMPRESSION: 1. No definite rib fracture is noted. 2. Coronary artery calcifications are noted suggesting coronary artery disease. 3. Small sliding-type hiatal hernia. 4. Bilateral nonobstructive nephrolithiasis. 5. Probable scarring or fibrosis is noted in the right upper lobe. 6. Aortic atherosclerosis. Aortic Atherosclerosis (ICD10-I70.0). Electronically Signed   By: Marijo Conception M.D.   On: 12/15/2020 09:28   CT Cervical Spine Wo Contrast  Result Date: 12/15/2020 CLINICAL DATA:  Fall, on anticoagulation EXAM: CT HEAD WITHOUT CONTRAST CT CERVICAL SPINE WITHOUT CONTRAST TECHNIQUE: Multidetector CT imaging of the head and cervical spine was performed following the standard protocol without intravenous contrast. Multiplanar CT image reconstructions of the cervical spine were also generated. COMPARISON:  MR head and cervical spine 07/29/2018, CT head and cervical spine 12/02/2018 FINDINGS: CT HEAD FINDINGS Brain: No evidence of acute infarction, hemorrhage, hydrocephalus, extra-axial collection, visible mass lesion or mass effect. Symmetric prominence of the ventricles, cisterns and sulci compatible with parenchymal volume loss. Mixed patchy and confluent areas of white matter hypoattenuation are most compatible with chronic microvascular angiopathy. Vascular: Atherosclerotic calcification of the carotid siphons and intradural vertebral arteries. No hyperdense vessel. Skull: No calvarial fracture or suspicious osseous lesion. No scalp swelling or hematoma. Sinuses/Orbits: Paranasal sinuses and mastoid air cells are predominantly clear. Orbital structures are unremarkable aside from prior lens extractions. Other: Bilateral TMJ arthrosis, right greater than left. CT CERVICAL SPINE FINDINGS Alignment: Stabilization collar in place at the  time of exam. Straightening of the normal cervical lordosis. Focal reversal noted at the C5-6 level. No evidence of traumatic listhesis. No abnormally widened, perched or jumped facets. Normal alignment of the craniocervical and atlantoaxial articulations accounting for cranial positioning and atlantodental arthrosis. Skull base and vertebrae: No acute skull base fracture. No vertebral body fracture or height loss. The osseous structures appear diffusely demineralized which may limit detection of small or nondisplaced fractures. Stable appearance of slightly more focal lucency in the C3 and T1 posterosuperior corner, likely vertebral body hemangiomata or demineralization without concerning features on comparison MR imaging. Moderate arthrosis at the atlantodental and basion dens intervals, stable prior. Multilevel cervical spondylitic changes, as detailed below. Soft tissues and spinal canal: No pre or paravertebral fluid or swelling. No visible canal hematoma. Airways patent. Disc levels: Multilevel intervertebral disc height loss with spondylitic endplate changes. Features maximal C5-C7 with larger disc osteophyte complexes at the C5-6, C6-7 levels resulting and moderate canal stenoses. Additional uncinate spurring facet hypertrophic changes throughout the cervical spine result in mild multilevel neural foraminal narrowing. Upper chest: Biapical pleuroparenchymal scarring is noted. Postsurgical changes at the base of the neck likely reflecting prior thyroidectomy. Calcification nodes. Other: None. IMPRESSION: 1. No acute intracranial abnormality. No scalp swelling or calvarial fracture. 2. Stable parenchymal volume loss and chronic microvascular ischemic white matter disease. 3. No acute cervical spine fracture or traumatic listhesis. 4. Multilevel cervical spondylitic and facet degenerative changes, maximal C5-C7 where there is moderate canal stenoses and mild multilevel neural  foraminal narrowing. 5. Stable  appearance of slightly more focal lucency in the C3 and T1 posterosuperior corner, likely vertebral body hemangiomata or demineralization without concerning features on interval comparison MR imaging. Electronically Signed   By: Lovena Le M.D.   On: 12/15/2020 06:36   CT Hip Left Wo Contrast  Result Date: 12/15/2020 CLINICAL DATA:  [place].  Left hip pain. EXAM: CT OF THE LEFT HIP WITHOUT CONTRAST TECHNIQUE: Multidetector CT imaging of the left hip was performed according to the standard protocol. Multiplanar CT image reconstructions were also generated. COMPARISON:  Radiographs 12/15/2020 FINDINGS: The left hip is normally located. No acute hip fracture is identified. There are moderate degenerative changes. No evidence of AVN. The left hemipelvic bony structures are intact. No pelvic fractures. There is a large subcutaneous hematoma overlying the left hip. I do not see an obvious muscle tear or intramuscular hematoma. No significant left-sided intrapelvic abnormalities are identified. Incidental note is made of diverticulosis and moderate to advanced vascular calcifications. IMPRESSION: 1. Large subcutaneous hematoma overlying the left hip. 2. No acute hip or left-sided pelvic fractures. 3. Moderate degenerative changes involving the left hip. 4. Aortic atherosclerosis. Aortic Atherosclerosis (ICD10-I70.0). Electronically Signed   By: Marijo Sanes M.D.   On: 12/15/2020 09:19   DG Hip Unilat W or Wo Pelvis 2-3 Views Left  Result Date: 12/15/2020 CLINICAL DATA:  Left hip and elbow pain due to fall EXAM: DG HIP (WITH OR WITHOUT PELVIS) 2-3V LEFT COMPARISON:  None. FINDINGS: Lateral left hip swelling and/or hematoma. The osseous structures appear diffusely demineralized which may limit detection of small or nondisplaced fractures. No acute bony abnormality. Specifically, no fracture, subluxation, or dislocation. Degenerative changes present in the lower lumbar spine, bilateral hips and pelvis. Extensive  atherosclerotic calcifications throughout the abdomen pelvis. Catheter tubing projects over the mid abdomen. IMPRESSION: Lateral left hip swelling and/or hematoma. No acute osseous abnormality. Degenerative changes in the spine, hips and pelvis. Electronically Signed   By: Lovena Le M.D.   On: 12/15/2020 06:53    EKG: Independently reviewed.  NSR with rate 72; no evidence of acute ischemia   Labs on Admission: I have personally reviewed the available labs and imaging studies at the time of the admission.  Pertinent labs:   Unremarkable BMP Normal CBC Normal UA Unegative COVID/flu   Assessment/Plan Principal Problem:   Fall Active Problems:   HYPERLIPIDEMIA   Hypothyroidism   Non-Hodgkin lymphoma, unspecified, intrathoracic lymph nodes (HCC)   Persistent atrial fibrillation (HCC)   Hip hematoma, left, initial encounter   Nondisplaced fracture of coronoid process of left ulna, initial encounter for closed fracture   Multiple rib fractures   Falls -Patient with recurrent falls -According to her, she has had new-onset weakness and this is the source of her falls -According to her sister, she has had several falls dating back a number of months with gradually worsening weakness -Also with ? Of cognitive impairment - patient denies but her sister appears to recognize this -Patient is adamant that she is safe to return to home but wants to know why she is falling -Could check ESR, CRP but given her current injuries it is not clear how much utility they will have -Labwork is unremarkable -Will request PT/OT/ST (cognitive) evaluations -TOC consult for possible HH assistance  Afib -Hold Eliquis given large hematoma -Will transition to Heparin drip for now and monitor -Continue Amio for rate control -Has pacemaker  L hip hematoma -No obvious MSK injury other than hematoma -Will  follow on Heparin drip  L elbow fracture -Orthopedic splint placed -She was actually able to use  that arm to roll herself  Possible R rib fractures -Xray with lateral R 7/8 rib fractures which were not seen on CT -Lidocaine patch  HTN -She does not appear to be taking medications for this issue at this time  HLD -Continue Crestor  Hypothyroidism -Normal TSH in 07/2020 -Continue Synthroid at current dose for now  Class 1 obesity -Body mass index is 32.93 kg/m.  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 Lindi Adie in 12/021 and no longer needs routine imaging -Annual f/u     Note: This patient has been tested and is negative for the novel coronavirus COVID-19. The patient has been fully vaccinated against COVID-19.   Level of care: Med-SurgMed Surg DVT prophylaxis: Heparin drip Code Status:  Full - confirmed with patient/family Family Communication: Sister was present throughout evaluation Disposition Plan:  The patient is from: home  Anticipated d/c is to: be determined  Anticipated d/c date will depend on clinical response to treatment, but possibly as early as tomorrow if she has excellent response to treatment  Patient is currently: acutely ill Consults called: PT/OT/ST/TOC team  Admission status:  Admit - It is my clinical opinion that admission to INPATIENT is reasonable and necessary because of the expectation that this patient will require hospital care that crosses at least 2 midnights to treat this condition based on the medical complexity of the problems presented.  Given the aforementioned information, the predictability of an adverse outcome is felt to be significant.    Karmen Bongo MD [facility]   How to contact the [facility] Attending or Consulting provider [place] or covering provider during after hours [place], for this patient?  1. Check the care team in [facility] and look for a)  attending/consulting TR[facility] provider listed and b) the [facility] team listed 2. Log into [email or web address] and use [place]'s universal password to access. If you do not have the password, please contact the hospital operator. 3. Locate the [facility] provider you are looking for under [facility] and page to a number that you can be directly reached. 4. If you still have difficulty reaching the provider, please page the [facility] (Director on Call) for the Hospitalists listed on amion for assistance.   12/15/2020, 5:29 PM

## 2020-12-15 NOTE — ED Triage Notes (Addendum)
Pt brought to ED via EMS from home with c/o left hip and elbow pain due to fall. Pt on eliquis for afib, has implanted pacemaker. No obvious bleeding. Per EMS, pt slid off of bed earlier this evening and called EMS but refused transport. Pt woke up later and fell again when attempting to go to the bathroom and called EMS again. Pt alert and oriented x 4 in ED, hypertensive, NAD. C-collar placed by EMS.  EMS v/s: 75 HR 96% room air 170/92

## 2020-12-15 NOTE — ED Notes (Signed)
Patient transported to CT 

## 2020-12-15 NOTE — Progress Notes (Signed)
Initial Nutrition Assessment  DOCUMENTATION CODES:  Not applicable  INTERVENTION:  Add Magic cup TID with meals, each supplement provides 290 kcal and 9 grams of protein.  Add MVI with minerals daily.  NUTRITION DIAGNOSIS:  Increased nutrient needs related to acute illness (broken L arm and 2 rib fx) as evidenced by estimated needs.  GOAL:  Patient will meet greater than or equal to 90% of their needs  MONITOR:  PO intake,Supplement acceptance,Labs  REASON FOR ASSESSMENT:  Consult  (Nutritional goals)  ASSESSMENT:  75 yo female with a PMH of dyslipidemia, hypothyroidism, hx of dysphagia, anemia, persistent A-fib, chronic bilateral pleural effusions, s/p pacemaker, hx of breast, lung cancer and non-Hodgkin's lymphoma, arthritis, and HTN who presents with a fall.   Pt reports eating well at home - having sandwiches and vegetables with protein and starches. She reports that the last 8 months, she has been on the ketogenic diet per recommendation of her doctor for weight loss. She reports losing 35 lbs on the diet and near the end, she did not deny herself foods she loved because she missed them.  Pt also reports a history of cancer where she lost 35 lbs 3 years ago and has since gained it back. She reports this is why she tried the diet.  Pt's exam noted some mild and one severe depletion. Pt has fluid accumulation in the BLE and may be masking other depletions. Some concern for malnutrition - cannot diagnose at this time.  In hx, pt has had a lap band procedure, pt did not mention this during interview.  Pt agreeable to having Magic Cup TID and having an MVI with minerals. Recommend to supplement her intake while admitted.  Relevant Medications: colace BID, synthroid, heparin @ 9.5 ml/hr, hydrocodone Labs: reviewed; Na 134, Glucose 104 HbA1c: 5.5% (11/2019)  NUTRITION - FOCUSED PHYSICAL EXAM: Flowsheet Row Most Recent Value  Orbital Region Mild depletion  Upper Arm Region Mild  depletion  Thoracic and Lumbar Region No depletion  Buccal Region Mild depletion  Temple Region Severe depletion  Clavicle Bone Region Mild depletion  Clavicle and Acromion Bone Region No depletion  Scapular Bone Region No depletion  Dorsal Hand Mild depletion  Patellar Region No depletion  Anterior Thigh Region No depletion  Posterior Calf Region No depletion  Edema (RD Assessment) Moderate  [BLE]  Hair Reviewed  Eyes Reviewed  [pale conjunctiva]  Mouth Reviewed  Skin Reviewed  Nails Reviewed  [Muehrcke's lines - indicates malnutrition]     Diet Order:   Diet Order            Diet Heart Room service appropriate? Yes with Assist; Fluid consistency: Thin  Diet effective now                EDUCATION NEEDS:  Education needs have been addressed  Skin:  Skin Assessment: Skin Integrity Issues: Skin Integrity Issues:: Other (Comment) Other: Ecchymosis on L hip  Last BM:  12/14/20  Height:  Ht Readings from Last 1 Encounters:  12/15/20 5\' 6"  (1.676 m)   Weight:  Wt Readings from Last 1 Encounters:  12/15/20 92.5 kg   Ideal Body Weight:  59.1 kg  BMI:  Body mass index is 32.93 kg/m.  Estimated Nutritional Needs:  Kcal:  1650-1850 Protein:  75-90 grams Fluid:  >1.65 L  Derrel Nip, RD, LDN Registered Dietitian After Hours/Weekend Pager # in [place]

## 2020-12-15 NOTE — ED Provider Notes (Signed)
Care of the patient was assumed from [place] PA-C at 630; see this provider's note for complete history of present illness, review of systems, and physical exam.  Briefly, the patient is a 75 y.o. female who presented to the ED with fall.   History significant for multiple falls, patient is on Eliquis for A. fib.  Was not leveled.  Patient was complaining of left elbow pain and left hip pain.  Questionably hit her head, no LOC according to prior provider.  Patient has a life alert button to use for falls which she frequently does.  Patient is a states that she normally uses a walker at home.  States for the past week she has been feeling weak.  Plan at time of handoff:  Awaiting imaging.      Physical Exam  BP (!) 165/81   Pulse 71   Temp 98.5 F (36.9 C) (Oral)   Resp 16   Ht 5\' 6"  (1.676 m)   Wt 94 kg   SpO2 98%   BMI 33.45 kg/m   Physical Exam Constitutional:      General: She is not in acute distress.    Appearance: Normal appearance. She is not ill-appearing, toxic-appearing or diaphoretic.  Cardiovascular:     Rate and Rhythm: Normal rate and regular rhythm.     Pulses: Normal pulses.  Pulmonary:     Effort: Pulmonary effort is normal.     Breath sounds: Normal breath sounds.  Musculoskeletal:        General: Normal range of motion.     Comments: Normal range of motion to neck. No thoracic or lumbar midline tenderness.  Left elbow with tenderness throughout olecranon.  Able to range elbow.  Radial pulse 2+ distally.  Point tenderness to ribs on right side, no flail chest.  Left hip with large hematoma, able to range hip with limited range of motion, very tender to palpation.  No external or internal rotation, is not shortened.  Unable to stand.  Unable to sit up on bed without assistance.  Skin:    General: Skin is warm and dry.     Capillary Refill: Capillary refill takes less than 2 seconds.  Neurological:     General: No focal deficit present.     Mental  Status: She is alert and oriented to person, place, and time. Mental status is at baseline.  Psychiatric:        Mood and Affect: Mood normal.        Behavior: Behavior normal.        Thought Content: Thought content normal.     ED Course/Procedures     Procedures  MDM  75 year old female on Eliquis that presents emerge department today for frequent falls.  Use life alert last night after falling twice.  See previous providers notes for full HPI.  Imaging shows nondisplaced left coronoid process fracture of elbow.  Also shows right seventh and eighth nondisplaced rib fractures.  Plain films of hip without any acute abnormality, CT head and neck without any acute abnormality.  Long-arm posterior splint applied.  Upon evaluation patient is complaining of severe left hip pain, states that this hurts her the most.  Denies any difficulty breathing.    CT chest without contrast does not show any acute fractures, spoke to radiologist Dr. Nyoka Cowden Junior about this since plain films do not show fractures.  He states that he does not see any obvious fracture, however this could be subclinical.  CT  of hip negative.  Upon reevaluation, patient states that she is unable to walk.  I think patient need to be admitted at this time, patient lives at home alone and unable to walk, with left hip pain and left elbow fracture patient will need to come in for PT OT.  Patient admitted to Dr. Lorin Mercy.  The patient appears reasonably stabilized for admission considering the current resources, flow, and capabilities available in the ED at this time, and I doubt any other [employer] requiring further screening and/or treatment in the ED prior to admission.  I discussed this case with my attending physician who cosigned this note including patient's presenting symptoms, physical exam, and planned diagnostics and interventions. Attending physician stated agreement with plan or made changes to plan which were implemented.    Attending physician assessed patient at bedside.        Alfredia Client, PA-C 12/15/20 1201    Breck Coons, MD 12/18/20 [phone]

## 2020-12-16 LAB — BASIC METABOLIC PANEL
Anion gap: 8 (ref 5–15)
BUN: 19 mg/dL (ref 8–23)
CO2: 27 mmol/L (ref 22–32)
Calcium: 8.5 mg/dL — ABNORMAL LOW (ref 8.9–10.3)
Chloride: 100 mmol/L (ref 98–111)
Creatinine, Ser: 0.99 mg/dL (ref 0.44–1.00)
GFR, Estimated: 59 mL/min — ABNORMAL LOW (ref >60)
Glucose, Bld: 94 mg/dL (ref 70–99)
Potassium: 4.5 mmol/L (ref 3.5–5.1)
Sodium: 135 mmol/L (ref 135–145)

## 2020-12-16 LAB — CBC
HCT: 38.5 % (ref 36.0–46.0)
Hemoglobin: 12.1 g/dL (ref 12.0–15.0)
MCH: 29.6 pg (ref 26.0–34.0)
MCHC: 31.4 g/dL (ref 30.0–36.0)
MCV: 94.1 fL (ref 80.0–100.0)
Platelets: 173 10*3/uL (ref 150–400)
RBC: 4.09 MIL/uL (ref 3.87–5.11)
RDW: 15.2 % (ref 11.5–15.5)
WBC: 6.3 10*3/uL (ref 4.0–10.5)
nRBC: 0 % (ref 0.0–0.2)

## 2020-12-16 LAB — APTT
aPTT: 29 seconds (ref 24–36)
aPTT: 74 seconds — ABNORMAL HIGH (ref 24–36)

## 2020-12-16 LAB — SEDIMENTATION RATE: Sed Rate: 12 mm/hr (ref 0–22)

## 2020-12-16 LAB — HEPARIN LEVEL (UNFRACTIONATED)
Heparin Unfractionated: 0.59 IU/mL (ref 0.30–0.70)
Heparin Unfractionated: 0.69 IU/mL (ref 0.30–0.70)

## 2020-12-16 NOTE — Evaluation (Signed)
Physical Therapy Evaluation Patient Details Name: Kayla Harrison MRN: [id number] DOB: 06-30-1946 Today's Date: 12/16/2020   History of Present Illness  Kayla Harrison is a 75 y.o. female with medical history significant of thyroid cancer/lung cancer/NHL with h/o SVC syndrome; CVA; pacemaker placement; afib on Eliquis; obesity s/p lap band surgery; hypothyroidism; HTN; and HLD presenting with a fall.  "It's a complicated story, which is why I wanted to be admitted".  She has been very unsteady.  Fell 2 weeks ago by the garbage can.  [place] again a week later, twisted her foot and fell into the recliner.  She has been getting weaker.  She went to ortho and was told she sprained her foot; she was given steroids and finished these Wednesday.  That AM, she started having severe pain on her B shins.  She has lymphedema and has been using a leg pump - ?related.  She started getting very weak yesterday and in the afternoon she was having significant trouble walking.  She felt like she had no energy and had difficulty getting up with her walker.  She had to urinate overnight and fell when she tried to stand, slid into the floor.   She got up again about 4AM and she was too weak to walk once again.  She fell against the bookcase and damaged her hip, elbow, and ribs. Patient has R rib fxs, Left elbow fracture, large hematoma over left hip.  Clinical Impression  Patient received in bed, alert, oriented, friends present in room. Patient reports she is just feeling weak. Reports most pain is in left hip where she has large hematoma. Little to no pain in left UE. Patient L UE wrapped and in sling. No weight bearing restrictions for L UE noted in chart. Patient requires min assist for bed mobility and sit to stand transfer. Upon sitting eob patient reports dizziness. BP in sitting 85/43. Patient requires min assist for sit to stand and mod assist for pivoting steps to recliner. Patient very fatigued after this. BP in  recliner 73/49. Patient is having low BP with mobility. Did not check in standing as she could not stand for that long. Patient will continue to benefit from skilled PT while here to improve strength and functional independence.        Follow Up Recommendations Home health PT;Supervision/Assistance - 24 hour    Equipment Recommendations  None recommended by PT    Recommendations for Other Services       Precautions / Restrictions Precautions Precautions: Fall Restrictions Weight Bearing Restrictions: No Other Position/Activity Restrictions: no orders for weight bearing restirctions      Mobility  Bed Mobility Overal bed mobility: Needs Assistance Bed Mobility: Supine to Sit     Supine to sit: Min assist;HOB elevated     General bed mobility comments: min assist to raise trunk to seated position    Transfers Overall transfer level: Needs assistance Equipment used: None Transfers: Sit to/from Stand Sit to Stand: Min assist         General transfer comment: patient feeling weak and dizzy with mobility.  Ambulation/Gait Ambulation/Gait assistance: Min assist;Mod assist Gait Distance (Feet): 3 Feet Assistive device: 1 person hand held assist Gait Pattern/deviations: Step-to pattern;Decreased step length - right;Decreased step length - left Gait velocity: decreased   General Gait Details: patient able to take a few pivoting steps to recliner. Once in recliner patient looking pale, became sleepy and very fatigued with minimal activity.  Stairs  Wheelchair Mobility    Modified Rankin (Stroke Patients Only)       Balance Overall balance assessment: Needs assistance;History of Falls Sitting-balance support: Feet supported Sitting balance-Leahy Scale: Good     Standing balance support: Single extremity supported;During functional activity Standing balance-Leahy Scale: Fair Standing balance comment: patient reliant on at least single UE support                              Pertinent Vitals/Pain Pain Assessment: Faces Faces Pain Scale: Hurts little more Pain Location: left hip Pain Descriptors / Indicators: Discomfort;Sore Pain Intervention(s): Monitored during session;Repositioned    Home Living Family/patient expects to be discharged to:: Private residence Living Arrangements: Alone Available Help at Discharge: Friend(s);Available PRN/intermittently Type of Home: House Home Access: Level entry     Home Layout: One level Home Equipment: Walker - 4 wheels;Shower seat - built in      Prior Function           Comments: cooks, Microbiologist, drives     Hand Dominance   Dominant Hand: Right    Extremity/Trunk Assessment   Upper Extremity Assessment Upper Extremity Assessment: Defer to OT evaluation    Lower Extremity Assessment Lower Extremity Assessment: Generalized weakness    Cervical / Trunk Assessment Cervical / Trunk Assessment: Normal  Communication   Communication: HOH  Cognition Arousal/Alertness: Awake/alert Behavior During Therapy: WFL for tasks assessed/performed Overall Cognitive Status: Within Functional Limits for tasks assessed                                        General Comments      Exercises     Assessment/Plan    PT Assessment Patient needs continued PT services  PT Problem List Decreased strength;Decreased mobility;Decreased activity tolerance;Decreased balance;Pain       PT Treatment Interventions DME instruction;Therapeutic exercise;Balance training;Gait training;Functional mobility training;Therapeutic activities;Patient/family education    PT Goals (Current goals can be found in the Care Plan section)  Acute Rehab PT Goals Patient Stated Goal: to return home, improve strength, stop falling PT Goal Formulation: With patient Time For Goal Achievement: 12/29/20 Potential to Achieve Goals: Fair    Frequency Min 3X/week   Barriers to  discharge Decreased caregiver support      Co-evaluation               AM-PAC PT "6 Clicks" Mobility  Outcome Measure Help needed turning from your back to your side while in a flat bed without using bedrails?: A Little Help needed moving from lying on your back to sitting on the side of a flat bed without using bedrails?: A Little Help needed moving to and from a bed to a chair (including a wheelchair)?: A Little Help needed standing up from a chair using your arms (e.g., wheelchair or bedside chair)?: A Little Help needed to walk in hospital room?: A Lot Help needed climbing 3-5 steps with a railing? : Total 6 Click Score: 15    End of Session   Activity Tolerance: Patient limited by fatigue Patient left: in chair;with call bell/phone within reach Nurse Communication: Mobility status PT Visit Diagnosis: Unsteadiness on feet (R26.81);Other abnormalities of gait and mobility (R26.89);Repeated falls (R29.6);Muscle weakness (generalized) (M62.81);Difficulty in walking, not elsewhere classified (R26.2);Pain Pain - Right/Left: Left Pain - part of body: Hip    Time: 1350-1420 PT  Time Calculation (min) (ACUTE ONLY): 30 min   Charges:   PT Evaluation $PT Eval Moderate Complexity: 1 Mod PT Treatments $Therapeutic Activity: 8-22 mins        Somaya Grassi, PT, GCS 12/16/20,2:37 PM

## 2020-12-16 NOTE — Progress Notes (Signed)
[facility] for heparin Indication: atrial fibrillation  Allergies  Allergen Reactions  . Dofetilide Other (See Comments)    Prolonged QT interval   . Ranolazine     Balance issues  . Rivaroxaban Other (See Comments)    Nose Bleed X 6 hrs ; packing in ER Nasal hemorrhage  . Rivaroxaban     Nose Bleed X 6 hrs ; packing in ER Nasal hemorrhage  . Tape Other (See Comments)    SKIN IS THIN AND TEARS EASILY   . Tikosyn [Dofetilide] Other (See Comments)    "Long QT wave"   . Epinephrine Other (See Comments)    Oral anesthetic Dental form only (liquid). Patient stated she will become "out of it" she can hear you but cannot respond in normal fashion. "not fully with it"  . Epinephrine   . Ketamine Other (See Comments)    Hallucinations   . Adhesive [Tape] Rash    Paper tape as well  . Ketamine Other (See Comments)    HALLUCINATIONS    Patient Measurements: Height: 5\' 6"  (167.6 cm) Weight: 92.5 kg (204 lb) IBW/kg (Calculated) : 59.3 Heparin Dosing Weight: 80kg  Vital Signs: Temp: 97.3 F (36.3 C) (03/25 2023) Temp Source: Oral (03/25 2023) BP: 95/42 (03/25 2023) Pulse Rate: 70 (03/25 2023)  Labs: Recent Labs    12/15/20 0559 12/16/20 0251  HGB 13.4 12.1  HCT 41.5 38.5  PLT 171 173  APTT  --  29  HEPARINUNFRC  --  0.59  CREATININE 0.92 0.99    Estimated Creatinine Clearance: 55.4 mL/min (by C-G formula based on SCr of 0.99 mg/dL).   Medical History: Past Medical History:  Diagnosis Date  . Anemia   . Arthritis    osteoarthritis - knees and right shoulder  . Blood transfusion without reported diagnosis   . Breast cancer ([place])    Dr Margot Chimes, total thyroidectomy- 1999- for cancer  . 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 Chronic bilateral pleural effusions   . Colon polyp    Dr Earlean Shawl  . Complication of anesthesia    Ketamine produces LSD reaction, bright colored nightmarish experience   . Dyslipidemia   . Endometriosis   .  Fibroids   . H/O pleural effusion    s/p thoracentesis w 3221ml withdrawn  . Hepatitis    [place] ?hepatitis   . History of dysphagia    due to radiation therapy  . History of hiatal hernia    small noted on PET scan  . Hypertension   . Hypothyroidism   . Lung cancer, lower lobe ([place]) 01/2017   radiation RX completed 03/04/17; will start chemo 6/27, pt unaware of lung cancer  . Morbid obesity ([place])    Status post lap band surgery  . Nephrolithiasis   . Non Hodgkin's lymphoma ([place])    on chemotherapy  . Persistent atrial fibrillation ([place])    a. s/p PVI 2008 b. s/p convergent ablation 9030 complicated by bradycardia requiring pacemaker implant  . Personal history of radiation therapy   . Presence of permanent cardiac pacemaker   . Rotator cuff tear    Right  . Stroke [facility])    2003- [place] x2  . SVC syndrome    with lung mass and non hodgkins lymphoma  . Thyroid cancer ([place]) 2000   Assessment: 73 YOF presenting s/p fall, on Eliquis PTA for afib with last dose taken 3/24 @1900 .  CT head negative, elbow/rib  fx, hip hematoma, CBC wnl, OK to resume AC per MD.  Will use low end goals and will not bolus.  3/26 AM update:  APTT below goal  Goal of Therapy:  Heparin level 0.3-0.5 units/ml aPTT 66-85 seconds Monitor platelets by anticoagulation protocol: Yes   Plan:  Inc heparin to 1050 units/hr 1300 aPTT/heparin level  Narda Bonds, PharmD, BCPS Clinical Pharmacist Phone: [phone]

## 2020-12-16 NOTE — Progress Notes (Signed)
Secure chat sent to DrVann and Marlowe Kays RN re potential need for PICC placement. LUE restricted d/t fx.  RUE with poor vasculature, bruising and infiltrates.  Has PIV access at this time.

## 2020-12-16 NOTE — Plan of Care (Signed)
°  Problem: Clinical Measurements: °Goal: Cardiovascular complication will be avoided °Outcome: Progressing °  °Problem: Activity: °Goal: Risk for activity intolerance will decrease °Outcome: Progressing °  °

## 2020-12-16 NOTE — Progress Notes (Signed)
Progress Note    Kayla Harrison  UUV:[id number] DOB: [birth year]  DOA: 12/15/2020 PCP: [name], MD    Brief Narrative:     Medical records reviewed and are as summarized below:  Kayla Harrison is an 75 y.o. female with medical history significant of thyroid cancer/lung cancer/NHL with h/o SVC syndrome; CVA; pacemaker placement; afib on Eliquis; obesity s/p lap band surgery; hypothyroidism; HTN; and HLD presenting with a fall.  "It's a complicated story, which is why I wanted to be admitted". She has been very unsteady. Fell 2 weeks ago by the garbage can. [place] again a week later, twisted her foot and fell into the recliner. She has been getting weaker. She went to ortho and was told she sprained her foot; she was given steroids and finished these Wednesday. That AM, she started having severe pain on her B shins. She has lymphedema and has been using a leg pump - ?related. She started getting very weak yesterday and in the afternoon she was having significant trouble walking. She felt like she had no energy and had difficulty getting up with her walker. She had to urinate overnight and fell when she tried to stand, slid into the floor. She got up again about 4AM and she was too weak to walk once again. She fell against the bookcase and damaged her hip, elbow, and ribs. She has had a "wicked headache" for 2 days. She had prior CVA in 2002, but is taking Eliquis. Her inability to think, act yesterday was frustrating yesterday. No dysphagia or dysarthria. B weakness. She has a biochemistry and medical technology background and so "has significant medical knowledge and understands medical problems" better than most people.   Assessment/Plan:   Principal Problem:   Fall Active Problems:   HYPERLIPIDEMIA   Hypothyroidism   Non-Hodgkin lymphoma, unspecified, intrathoracic lymph nodes (HCC)   Persistent atrial fibrillation (HCC)   Hip hematoma, left, initial  encounter   Nondisplaced fracture of coronoid process of left ulna, initial encounter for closed fracture   Multiple rib fractures   Falls -Patient with recurrent falls -According to her, she has had new-onset weakness and this is the source of her falls -According to her sister, she has had several falls dating back a number of months with gradually worsening weakness -Also with ? Of cognitive impairment - patient denies but her sister appears to recognize this -Patient is adamant that she is safe to return to home but wants to know why she is falling -Labwork is unremarkable -Will request PT/OT/ST (cognitive) evaluations -TOC consult for possible HH assistance -follows with Dr. Caryl Comes and seems to have a h/o orthostatic hypotension although patient describes her falling as "legs giving out" at times and during other times "losing balance"  Afib -Hold Eliquis given large hematoma -Will transition to Heparin drip for now and monitor -Continue Amio for rate control -Has pacemaker -may need cards consult for risk/benefit of eliquis if falls continue  L hip hematoma -No obvious MSK injury other than hematoma -Will follow on Heparin drip  L elbow fracture - splint placed -She was actually able to use that arm to roll herself -outpatient follow up?  Possible R rib fractures -Xray with lateral R 7/8 rib fractures which were not seen on CT -Lidocaine patch  HTN -She does not appear to be taking medications for this issue at this time  HLD -Continue Crestor  Hypothyroidism -Normal TSH in 07/2020 -Continue Synthroid at current dose for now  Class 1 obesity -Body mass index is 32.93 kg/m.  -She is s/p lap band   H/o cancer -NHL with large mediastinal mass involving thyroid/lungs -Seen by Dr. Lindi Adie in 12/021 and no longer needs routine imaging -Annual f/u   Family Communication/Anticipated D/C date and plan/Code Status   DVT prophylaxis: heparin Code Status:  Full Code.  Disposition Plan: Status is: Inpatient  Remains inpatient appropriate because:Inpatient level of care appropriate due to severity of illness   Dispo: The patient is from: Home              Anticipated d/c is to: tbd              Patient currently is not medically stable to d/c.   Difficult to place patient No         Medical Consultants:    None.     Subjective:   Multiple complaints- time frame differentiates from H&P-- ie headache- from few days to weeks Also, despite falls and bring on blood thinner, she does not want to go to rehab  Objective:    Vitals:   12/15/20 1633 12/15/20 2023 12/16/20 0404 12/16/20 0726  BP: 117/67 (!) 95/42 125/69 (!) 108/51  Pulse: 70 70 67 69  Resp: 16 16 17 15   Temp: 98 F (36.7 C) (!) 97.3 F (36.3 C) 98 F (36.7 C) 98.3 F (36.8 C)  TempSrc: Oral Oral Oral Oral  SpO2: 97% 96% 97% 98%  Weight:      Height:        Intake/Output Summary (Last 24 hours) at 12/16/2020 1208 Last data filed at 12/16/2020 0900 Gross per 24 hour  Intake 591.94 ml  Output 200 ml  Net 391.94 ml   Filed Weights   12/15/20 0552 12/15/20 1257  Weight: 94 kg 92.5 kg    Exam:  General: Appearance:    Obese female in no acute distress     Lungs:     respirations unlabored  Heart:    Normal heart rate.    MS:   All extremities are intact. Large bruise on left hip  Neurologic:   Awake, alert, pleasant and cooperative    Data Reviewed:   I have personally reviewed following labs and imaging studies:  Labs: Labs show the following:   Basic Metabolic Panel: Recent Labs  Lab 12/15/20 0559 12/16/20 0251  NA 134* 135  K 4.7 4.5  CL 101 100  CO2 24 27  GLUCOSE 104* 94  BUN 21 19  CREATININE 0.92 0.99  CALCIUM 8.6* 8.5*   GFR Estimated Creatinine Clearance: 55.4 mL/min (by C-G formula based on SCr of 0.99 mg/dL). Liver Function Tests: No results for input(s): AST, ALT, ALKPHOS, BILITOT, PROT, ALBUMIN in the last 168  hours. No results for input(s): LIPASE, AMYLASE in the last 168 hours. No results for input(s): AMMONIA in the last 168 hours. Coagulation profile No results for input(s): INR, PROTIME in the last 168 hours.  CBC: Recent Labs  Lab 12/15/20 0559 12/16/20 0251  WBC 8.3 6.3  NEUTROABS 6.2  --   HGB 13.4 12.1  HCT 41.5 38.5  MCV 94.3 94.1  PLT 171 173   Cardiac Enzymes: No results for input(s): CKTOTAL, CKMB, CKMBINDEX, TROPONINI in the last 168 hours. BNP (last 3 results) No results for input(s): PROBNP in the last 8760 hours. CBG: No results for input(s): GLUCAP in the last 168 hours. D-Dimer: No results for input(s): DDIMER in the last 72 hours. Hgb A1c: No  results for input(s): HGBA1C in the last 72 hours. Lipid Profile: No results for input(s): CHOL, HDL, LDLCALC, TRIG, CHOLHDL, LDLDIRECT in the last 72 hours. Thyroid function studies: No results for input(s): TSH, T4TOTAL, T3FREE, THYROIDAB in the last 72 hours.  Invalid input(s): FREET3 Anemia work up: No results for input(s): VITAMINB12, FOLATE, FERRITIN, TIBC, IRON, RETICCTPCT in the last 72 hours. Sepsis Labs: Recent Labs  Lab 12/15/20 0559 12/16/20 0251  WBC 8.3 6.3    Microbiology Recent Results (from the past 240 hour(s))  Resp Panel by RT-PCR (Flu A&B, Covid) Nasopharyngeal Swab     Status: None   Collection Time: 12/15/20  8:44 AM   Specimen: Nasopharyngeal Swab; Nasopharyngeal(NP) swabs in vial transport medium  Result Value Ref Range Status   SARS Coronavirus 2 by RT PCR NEGATIVE NEGATIVE Final    Comment: (NOTE) SARS-CoV-2 target nucleic acids are NOT DETECTED.  The SARS-CoV-2 RNA is generally detectable in upper respiratory specimens during the acute phase of infection. The lowest concentration of SARS-CoV-2 viral copies this assay can detect is 138 copies/mL. A negative result does not preclude SARS-Cov-2 infection and should not be used as the sole basis for treatment or other patient  management decisions. A negative result may occur with  improper specimen collection/handling, submission of specimen other than nasopharyngeal swab, presence of viral mutation(s) within the areas targeted by this assay, and inadequate number of viral copies(<138 copies/mL). A negative result must be combined with clinical observations, patient history, and epidemiological information. The expected result is Negative.  Fact Sheet for Patients:  EntrepreneurPulse.com.au  Fact Sheet for Healthcare Providers:  IncredibleEmployment.be  This test is no t yet approved or cleared by the [place] FDA and  has been authorized for detection and/or diagnosis of SARS-CoV-2 by FDA under an Emergency Use Authorization (EUA). This EUA will remain  in effect (meaning this test can be used) for the duration of the COVID-19 declaration under Section 564(b)(1) of the Act, 21 U.S.C.section 360bbb-3(b)(1), unless the authorization is terminated  or revoked sooner.       Influenza A by PCR NEGATIVE NEGATIVE Final   Influenza B by PCR NEGATIVE NEGATIVE Final    Comment: (NOTE) The Xpert Xpress SARS-CoV-2/FLU/RSV plus assay is intended as an aid in the diagnosis of influenza from Nasopharyngeal swab specimens and should not be used as a sole basis for treatment. Nasal washings and aspirates are unacceptable for Xpert Xpress SARS-CoV-2/FLU/RSV testing.  Fact Sheet for Patients: EntrepreneurPulse.com.au  Fact Sheet for Healthcare Providers: IncredibleEmployment.be  This test is not yet approved or cleared by the [place] FDA and has been authorized for detection and/or diagnosis of SARS-CoV-2 by FDA under an Emergency Use Authorization (EUA). This EUA will remain in effect (meaning this test can be used) for the duration of the COVID-19 declaration under Section 564(b)(1) of the Act, 21 U.S.C. section 360bbb-3(b)(1),  unless the authorization is terminated or revoked.  Performed at [facility], [place] [address], [place], [place] [address]     Procedures and diagnostic studies:  DG Ribs Unilateral W/Chest Right  Result Date: 12/15/2020 CLINICAL DATA:  Fall. EXAM: RIGHT RIBS AND CHEST - 3+ VIEW COMPARISON:  10/12/2020 FINDINGS: Cardiomegaly. Dual-chamber pacer leads in place. Left atrial clipping. Interstitial prominence which is stable. There is paramediastinal scarring on a 2021 chest CT. No hemothorax or pneumothorax. Lateral right seventh and eighth rib fractures which are nondisplaced and marked on the images. Reverse glenohumeral arthroplasty on the right. Lap band. Postoperative  thoracic inlet and left breast. IMPRESSION: Nondisplaced right seventh and eighth rib fractures. No hemothorax or pneumothorax. Electronically Signed   By: Monte Fantasia M.D.   On: 12/15/2020 06:52   DG Elbow Complete Left  Result Date: 12/15/2020 CLINICAL DATA:  Fall EXAM: LEFT ELBOW - COMPLETE 3+ VIEW COMPARISON:  None. FINDINGS: The osseous structures appear diffusely demineralized which may limit detection of small or nondisplaced fractures. Nondisplaced fracture through the coronoid process. Questionable lucency through the lateral aspect of the radial head. Degenerative changes about the elbow with enthesopathic changes on the medial epicondyle. Vascular calcium the soft tissues. Soft tissue swelling of the elbow is present. IMPRESSION: 1. Nondisplaced fracture through the coronoid process. 2. Questionable lucency versus projection over the lateral aspect of the radial head, correlate for point tenderness to exclude acute fracture. 3. Degenerative changes about the elbow with enthesopathic changes on the medial epicondyle. 4. Minimal soft tissue swelling, no sizeable effusion. Electronically Signed   By: Lovena Le M.D.   On: 12/15/2020 06:57   CT Head Wo Contrast  Result Date: 12/15/2020 CLINICAL DATA:  Fall, on  anticoagulation EXAM: CT HEAD WITHOUT CONTRAST CT CERVICAL SPINE WITHOUT CONTRAST TECHNIQUE: Multidetector CT imaging of the head and cervical spine was performed following the standard protocol without intravenous contrast. Multiplanar CT image reconstructions of the cervical spine were also generated. COMPARISON:  MR head and cervical spine 07/29/2018, CT head and cervical spine 12/02/2018 FINDINGS: CT HEAD FINDINGS Brain: No evidence of acute infarction, hemorrhage, hydrocephalus, extra-axial collection, visible mass lesion or mass effect. Symmetric prominence of the ventricles, cisterns and sulci compatible with parenchymal volume loss. Mixed patchy and confluent areas of white matter hypoattenuation are most compatible with chronic microvascular angiopathy. Vascular: Atherosclerotic calcification of the carotid siphons and intradural vertebral arteries. No hyperdense vessel. Skull: No calvarial fracture or suspicious osseous lesion. No scalp swelling or hematoma. Sinuses/Orbits: Paranasal sinuses and mastoid air cells are predominantly clear. Orbital structures are unremarkable aside from prior lens extractions. Other: Bilateral TMJ arthrosis, right greater than left. CT CERVICAL SPINE FINDINGS Alignment: Stabilization collar in place at the time of exam. Straightening of the normal cervical lordosis. Focal reversal noted at the C5-6 level. No evidence of traumatic listhesis. No abnormally widened, perched or jumped facets. Normal alignment of the craniocervical and atlantoaxial articulations accounting for cranial positioning and atlantodental arthrosis. Skull base and vertebrae: No acute skull base fracture. No vertebral body fracture or height loss. The osseous structures appear diffusely demineralized which may limit detection of small or nondisplaced fractures. Stable appearance of slightly more focal lucency in the C3 and T1 posterosuperior corner, likely vertebral body hemangiomata or demineralization  without concerning features on comparison MR imaging. Moderate arthrosis at the atlantodental and basion dens intervals, stable prior. Multilevel cervical spondylitic changes, as detailed below. Soft tissues and spinal canal: No pre or paravertebral fluid or swelling. No visible canal hematoma. Airways patent. Disc levels: Multilevel intervertebral disc height loss with spondylitic endplate changes. Features maximal C5-C7 with larger disc osteophyte complexes at the C5-6, C6-7 levels resulting and moderate canal stenoses. Additional uncinate spurring facet hypertrophic changes throughout the cervical spine result in mild multilevel neural foraminal narrowing. Upper chest: Biapical pleuroparenchymal scarring is noted. Postsurgical changes at the base of the neck likely reflecting prior thyroidectomy. Calcification nodes. Other: None. IMPRESSION: 1. No acute intracranial abnormality. No scalp swelling or calvarial fracture. 2. Stable parenchymal volume loss and chronic microvascular ischemic white matter disease. 3. No acute cervical spine fracture or traumatic  listhesis. 4. Multilevel cervical spondylitic and facet degenerative changes, maximal C5-C7 where there is moderate canal stenoses and mild multilevel neural foraminal narrowing. 5. Stable appearance of slightly more focal lucency in the C3 and T1 posterosuperior corner, likely vertebral body hemangiomata or demineralization without concerning features on interval comparison MR imaging. Electronically Signed   By: Lovena Le M.D.   On: 12/15/2020 06:36   CT Chest Wo Contrast  Result Date: 12/15/2020 CLINICAL DATA:  Rib fractures.  History of non-Hodgkin's lymphoma. EXAM: CT CHEST WITHOUT CONTRAST TECHNIQUE: Multidetector CT imaging of the chest was performed following the standard protocol without IV contrast. COMPARISON:  August 21, 2020. FINDINGS: Cardiovascular: Atherosclerosis of thoracic aorta is noted without aneurysm formation. Normal cardiac  size. No pericardial effusion. Left-sided pacemaker is in grossly good position. Coronary artery calcifications are noted. Mediastinum/Nodes: Small sliding-type hiatal hernia. Thyroid gland is unremarkable. No definite adenopathy is noted, although evaluation is significantly limited due the lack of intravenous contrast. Lungs/Pleura: No pneumothorax or significant pleural effusion is noted. Mild bilateral posterior basilar subsegmental atelectasis is noted. Probable scarring or fibrosis is noted in the right upper lobe. Upper Abdomen: Lap band device is noted. Bilateral nonobstructive nephrolithiasis is noted. Musculoskeletal: No chest wall mass or suspicious bone lesions identified. Status post right total shoulder arthroplasty. IMPRESSION: 1. No definite rib fracture is noted. 2. Coronary artery calcifications are noted suggesting coronary artery disease. 3. Small sliding-type hiatal hernia. 4. Bilateral nonobstructive nephrolithiasis. 5. Probable scarring or fibrosis is noted in the right upper lobe. 6. Aortic atherosclerosis. Aortic Atherosclerosis (ICD10-I70.0). Electronically Signed   By: Marijo Conception M.D.   On: 12/15/2020 09:28   CT Cervical Spine Wo Contrast  Result Date: 12/15/2020 CLINICAL DATA:  Fall, on anticoagulation EXAM: CT HEAD WITHOUT CONTRAST CT CERVICAL SPINE WITHOUT CONTRAST TECHNIQUE: Multidetector CT imaging of the head and cervical spine was performed following the standard protocol without intravenous contrast. Multiplanar CT image reconstructions of the cervical spine were also generated. COMPARISON:  MR head and cervical spine 07/29/2018, CT head and cervical spine 12/02/2018 FINDINGS: CT HEAD FINDINGS Brain: No evidence of acute infarction, hemorrhage, hydrocephalus, extra-axial collection, visible mass lesion or mass effect. Symmetric prominence of the ventricles, cisterns and sulci compatible with parenchymal volume loss. Mixed patchy and confluent areas of white matter  hypoattenuation are most compatible with chronic microvascular angiopathy. Vascular: Atherosclerotic calcification of the carotid siphons and intradural vertebral arteries. No hyperdense vessel. Skull: No calvarial fracture or suspicious osseous lesion. No scalp swelling or hematoma. Sinuses/Orbits: Paranasal sinuses and mastoid air cells are predominantly clear. Orbital structures are unremarkable aside from prior lens extractions. Other: Bilateral TMJ arthrosis, right greater than left. CT CERVICAL SPINE FINDINGS Alignment: Stabilization collar in place at the time of exam. Straightening of the normal cervical lordosis. Focal reversal noted at the C5-6 level. No evidence of traumatic listhesis. No abnormally widened, perched or jumped facets. Normal alignment of the craniocervical and atlantoaxial articulations accounting for cranial positioning and atlantodental arthrosis. Skull base and vertebrae: No acute skull base fracture. No vertebral body fracture or height loss. The osseous structures appear diffusely demineralized which may limit detection of small or nondisplaced fractures. Stable appearance of slightly more focal lucency in the C3 and T1 posterosuperior corner, likely vertebral body hemangiomata or demineralization without concerning features on comparison MR imaging. Moderate arthrosis at the atlantodental and basion dens intervals, stable prior. Multilevel cervical spondylitic changes, as detailed below. Soft tissues and spinal canal: No pre or paravertebral fluid or swelling.  No visible canal hematoma. Airways patent. Disc levels: Multilevel intervertebral disc height loss with spondylitic endplate changes. Features maximal C5-C7 with larger disc osteophyte complexes at the C5-6, C6-7 levels resulting and moderate canal stenoses. Additional uncinate spurring facet hypertrophic changes throughout the cervical spine result in mild multilevel neural foraminal narrowing. Upper chest: Biapical  pleuroparenchymal scarring is noted. Postsurgical changes at the base of the neck likely reflecting prior thyroidectomy. Calcification nodes. Other: None. IMPRESSION: 1. No acute intracranial abnormality. No scalp swelling or calvarial fracture. 2. Stable parenchymal volume loss and chronic microvascular ischemic white matter disease. 3. No acute cervical spine fracture or traumatic listhesis. 4. Multilevel cervical spondylitic and facet degenerative changes, maximal C5-C7 where there is moderate canal stenoses and mild multilevel neural foraminal narrowing. 5. Stable appearance of slightly more focal lucency in the C3 and T1 posterosuperior corner, likely vertebral body hemangiomata or demineralization without concerning features on interval comparison MR imaging. Electronically Signed   By: Lovena Le M.D.   On: 12/15/2020 06:36   CT Hip Left Wo Contrast  Result Date: 12/15/2020 CLINICAL DATA:  [place].  Left hip pain. EXAM: CT OF THE LEFT HIP WITHOUT CONTRAST TECHNIQUE: Multidetector CT imaging of the left hip was performed according to the standard protocol. Multiplanar CT image reconstructions were also generated. COMPARISON:  Radiographs 12/15/2020 FINDINGS: The left hip is normally located. No acute hip fracture is identified. There are moderate degenerative changes. No evidence of AVN. The left hemipelvic bony structures are intact. No pelvic fractures. There is a large subcutaneous hematoma overlying the left hip. I do not see an obvious muscle tear or intramuscular hematoma. No significant left-sided intrapelvic abnormalities are identified. Incidental note is made of diverticulosis and moderate to advanced vascular calcifications. IMPRESSION: 1. Large subcutaneous hematoma overlying the left hip. 2. No acute hip or left-sided pelvic fractures. 3. Moderate degenerative changes involving the left hip. 4. Aortic atherosclerosis. Aortic Atherosclerosis (ICD10-I70.0). Electronically Signed   By: Marijo Sanes M.D.   On: 12/15/2020 09:19   DG Hip Unilat W or Wo Pelvis 2-3 Views Left  Result Date: 12/15/2020 CLINICAL DATA:  Left hip and elbow pain due to fall EXAM: DG HIP (WITH OR WITHOUT PELVIS) 2-3V LEFT COMPARISON:  None. FINDINGS: Lateral left hip swelling and/or hematoma. The osseous structures appear diffusely demineralized which may limit detection of small or nondisplaced fractures. No acute bony abnormality. Specifically, no fracture, subluxation, or dislocation. Degenerative changes present in the lower lumbar spine, bilateral hips and pelvis. Extensive atherosclerotic calcifications throughout the abdomen pelvis. Catheter tubing projects over the mid abdomen. IMPRESSION: Lateral left hip swelling and/or hematoma. No acute osseous abnormality. Degenerative changes in the spine, hips and pelvis. Electronically Signed   By: Lovena Le M.D.   On: 12/15/2020 06:53    Medications:    amiodarone  200 mg Oral Daily   docusate sodium  100 mg Oral BID   levothyroxine  137 mcg Oral Once per day on Sat   Or   levothyroxine  68.5 mcg Oral Once per day on Sat   lidocaine  1 patch Transdermal Q24H   multivitamin with minerals  1 tablet Oral Daily   [START ON 12/18/2020] rosuvastatin  10 mg Oral Once per day on Mon Thu   Continuous Infusions:  heparin 1,050 Units/hr (12/16/20 0948)     LOS: 1 day   Geradine Girt  [facility]   How to contact the [facility] Attending or Consulting provider [place] or covering provider  during after hours 7P -7A, for this patient?  1. Check the care team in [facility] and look for a) attending/consulting [facility] provider listed and b) the [facility] team listed 2. Log into [email or web address] and use [place]'s universal password to access. If you do not have the password, please contact the hospital operator. 3. Locate the [facility] provider you are looking for under [facility] and page to a number that you can be directly reached. 4. If you still have difficulty  reaching the provider, please page the [facility] (Director on Call) for the Hospitalists listed on amion for assistance.  12/16/2020, 12:08 PM

## 2020-12-16 NOTE — Progress Notes (Addendum)
[place] for heparin Indication: atrial fibrillation  Allergies  Allergen Reactions  . Dofetilide Other (See Comments)    Prolonged QT interval   . Ranolazine     Balance issues  . Rivaroxaban Other (See Comments)    Nose Bleed X 6 hrs ; packing in ER Nasal hemorrhage  . Rivaroxaban     Nose Bleed X 6 hrs ; packing in ER Nasal hemorrhage  . Tape Other (See Comments)    SKIN IS THIN AND TEARS EASILY   . Tikosyn [Dofetilide] Other (See Comments)    "Long QT wave"   . Epinephrine Other (See Comments)    Oral anesthetic Dental form only (liquid). Patient stated she will become "out of it" she can hear you but cannot respond in normal fashion. "not fully with it"  . Epinephrine   . Ketamine Other (See Comments)    Hallucinations   . Adhesive [Tape] Rash    Paper tape as well  . Ketamine Other (See Comments)    HALLUCINATIONS    Patient Measurements: Height: 5\' 6"  (167.6 cm) Weight: 92.5 kg (204 lb) IBW/kg (Calculated) : 59.3 Heparin Dosing Weight: 80kg  Vital Signs: Temp: 98.3 F (36.8 C) (03/26 0726) Temp Source: Oral (03/26 0726) BP: 108/51 (03/26 0726) Pulse Rate: 69 (03/26 0726)  Labs: Recent Labs    12/15/20 0559 12/16/20 0251 12/16/20 1243  HGB 13.4 12.1  --   HCT Kayla.5 38.5  --   PLT 171 173  --   APTT  --  29 74*  HEPARINUNFRC  --  0.59 0.69  CREATININE 0.92 0.99  --     Estimated Creatinine Clearance: 55.4 mL/min (by C-G formula based on SCr of 0.99 mg/dL).  Assessment: Kayla Harrison presenting s/p fall, on Eliquis PTA for afib with last dose taken 3/24 @1900 .  CT head negative, elbow/rib fx, hip hematoma, CBC wnl, OK to resume AC per MD.  Will use low end goals and will not bolus.  PTT within range this afternoon  Goal of Therapy:  Heparin level 0.3-0.5 units/ml aPTT 66-85 seconds Monitor platelets by anticoagulation protocol: Yes   Plan:  Continue heparin at 1050 units / hr Follow up AM labs F/u long  term [facility] plan  Thank you Anette Guarneri, PharmD 12/16/2020 2:17 PM

## 2020-12-16 NOTE — Evaluation (Signed)
Occupational Therapy Evaluation Patient Details Name: Kayla Harrison MRN: [id number] DOB: 12-Jan-1946 Today's Date: 12/16/2020    History of Present Illness Kayla Harrison is a 75 y.o. female with medical history significant of thyroid cancer/lung cancer/NHL with h/o SVC syndrome; CVA; pacemaker placement; afib on Eliquis; obesity s/p lap band surgery; hypothyroidism; HTN; and HLD presenting with a fall.  Patient has been very unsteady.  Multiple falls over the last several weeks.  Presents with painful hip, and non displaced fxs to L elbow, and  R ribs. Large hematoma over left hip.   Clinical Impression   Patient admitted for the diagnosis above.  PTA she was living alone, but admits to progressing weakness.  She was still driving, and was largely independent with ADL and functional mobility/toileting at RW level.  Deficits are listed below.  Currently she is needing up to [place] for mobility due to balance deficits, and up to [place] A for ADL support.  OT will follow in the acute setting to maximize her functional status.  OT will recommend [place] at this point, but she will need to demonstrate she has the capacity to be safe alone at home with intermittent assist.  SNF may be needed. OT issued theraputty and HEP sheet for fist pumps and shoulder ROM.     Follow Up Recommendations  Home health OT;Supervision - Intermittent    Equipment Recommendations  3 in 1 bedside commode    Recommendations for Other Services       Precautions / Restrictions Precautions Precautions: Fall Required Braces or Orthoses: Splint/Cast Splint/Cast: L long arm gutter splint restricting L wrist, forearm and elbow AROM.  No orders regarding WBS or ROM restrictions. Restrictions Weight Bearing Restrictions: No Other Position/Activity Restrictions: assume NWB given splint.  Sling for comfort and mobility.      Mobility Bed Mobility Overal bed mobility: Needs Assistance Bed Mobility: Sit to Supine      Supine to sit: Supervision     General bed mobility comments: min assist to raise trunk to seated position    Transfers Overall transfer level: Needs assistance Equipment used: None Transfers: Sit to/from Stand;Stand Pivot Transfers Sit to Stand: Min assist Stand pivot transfers: Min assist       General transfer comment: patient feeling weak and dizzy with mobility.    Balance Overall balance assessment: Needs assistance;History of Falls Sitting-balance support: Feet supported Sitting balance-Leahy Scale: Good     Standing balance support: Single extremity supported;During functional activity Standing balance-Leahy Scale: Poor Standing balance comment: patient reliant on at least single UE support               High Level Balance Comments: patient losing balance bacwards with sit to stand.           ADL either performed or assessed with clinical judgement   ADL Overall ADL's : Needs assistance/impaired Eating/Feeding: Set up;Sitting   Grooming: Wash/dry hands;Wash/dry face;Oral care;Set up;Sitting           Upper Body Dressing : Moderate assistance;Sitting   Lower Body Dressing: Moderate assistance;Sit to/from stand               Functional mobility during ADLs: Minimal assistance General ADL Comments: HHA     Vision Baseline Vision/History: Wears glasses Wears Glasses: Reading only Patient Visual Report: No change from baseline       Perception     Praxis      Pertinent Vitals/Pain Pain Assessment: Faces Pain Score: 3  Faces Pain Scale: Hurts little more Pain Location: left hip Pain Descriptors / Indicators: Discomfort;Sore Pain Intervention(s): Monitored during session     Hand Dominance Right   Extremity/Trunk Assessment Upper Extremity Assessment Upper Extremity Assessment: LUE deficits/detail RUE Deficits / Details: Generalized weakness LUE: Unable to fully assess due to immobilization   Lower Extremity  Assessment Lower Extremity Assessment: Defer to PT evaluation   Cervical / Trunk Assessment Cervical / Trunk Assessment: Normal   Communication Communication Communication: No difficulties   Cognition Arousal/Alertness: Awake/alert Behavior During Therapy: WFL for tasks assessed/performed Overall Cognitive Status: Within Functional Limits for tasks assessed                                     General Comments       Exercises Shoulder Exercises Shoulder Flexion: AROM;Left;10 reps;Seated Shoulder Extension: AROM;Left;10 reps;Seated Shoulder ABduction: AROM;Left;10 reps;Seated Digit Composite Flexion: AROM;Left;15 reps;Seated Composite Extension: AROM;Left;15 reps;Seated   Shoulder Instructions      Home Living Family/patient expects to be discharged to:: Private residence Living Arrangements: Alone Available Help at Discharge: Friend(s);Available PRN/intermittently Type of Home: House Home Access: Level entry     Home Layout: One level     Bathroom Shower/Tub: Occupational psychologist: Handicapped height     Home Equipment: Environmental consultant - 4 wheels;Shower seat - built in          Prior Functioning/Environment Level of Independence: Independent with assistive device(s)        Comments: RW for mobility.  she has used a cane in the past.        OT Problem List: Decreased strength;Decreased range of motion;Decreased activity tolerance;Impaired balance (sitting and/or standing);Decreased knowledge of use of DME or AE;Impaired UE functional use;Pain      OT Treatment/Interventions: Self-care/ADL training;Therapeutic exercise;DME and/or AE instruction;Balance training;Therapeutic activities    OT Goals(Current goals can be found in the care plan section) Acute Rehab OT Goals Patient Stated Goal: to return home, improve strength, stop falling OT Goal Formulation: With patient Time For Goal Achievement: 12/30/20 Potential to Achieve Goals:  Good ADL Goals Pt Will Perform Grooming: with set-up;sitting;standing Pt Will Perform Lower Body Bathing: with set-up;sit to/from stand Pt Will Perform Lower Body Dressing: with set-up;sit to/from stand Pt Will Transfer to Toilet: with modified independence;ambulating Pt Will Perform Toileting - Clothing Manipulation and hygiene: with modified independence;sit to/from stand Pt/caregiver will Perform Home Exercise Program: Increased ROM;Left upper extremity;Independently;With theraputty;With written HEP provided  OT Frequency: Min 2X/week   Barriers to D/C:    none noted       Co-evaluation              AM-PAC OT "6 Clicks" Daily Activity     Outcome Measure Help from another person eating meals?: None Help from another person taking care of personal grooming?: None Help from another person toileting, which includes using toliet, bedpan, or urinal?: A Little Help from another person bathing (including washing, rinsing, drying)?: A Lot Help from another person to put on and taking off regular upper body clothing?: A Lot Help from another person to put on and taking off regular lower body clothing?: A Lot 6 Click Score: 17   End of Session Nurse Communication: Mobility status  Activity Tolerance: Patient tolerated treatment well Patient left: in bed;with call bell/phone within reach  OT Visit Diagnosis: Unsteadiness on feet (R26.81);Muscle weakness (generalized) (M62.81);History of falling (Z91.81);Pain;Dizziness  and giddiness (R42) Pain - Right/Left: Left Pain - part of body: Hip                Time: 1445-1511 OT Time Calculation (min): 26 min Charges:  OT General Charges $OT Visit: 1 Visit OT Evaluation $OT Eval Moderate Complexity: 1 Mod OT Treatments $Self Care/Home Management : 8-22 mins  12/16/2020  Rich, OTR/L  Acute Rehabilitation Services  Office:  [phone]   Metta Clines 12/16/2020, 3:24 PM

## 2020-12-17 DIAGNOSIS — Z950 Presence of cardiac pacemaker: Secondary | ICD-10-CM

## 2020-12-17 DIAGNOSIS — S42402A Unspecified fracture of lower end of left humerus, initial encounter for closed fracture: Secondary | ICD-10-CM

## 2020-12-17 DIAGNOSIS — Z8673 Personal history of transient ischemic attack (TIA), and cerebral infarction without residual deficits: Secondary | ICD-10-CM

## 2020-12-17 DIAGNOSIS — I951 Orthostatic hypotension: Secondary | ICD-10-CM | POA: Diagnosis not present

## 2020-12-17 DIAGNOSIS — W19XXXA Unspecified fall, initial encounter: Secondary | ICD-10-CM

## 2020-12-17 DIAGNOSIS — S7002XA Contusion of left hip, initial encounter: Principal | ICD-10-CM

## 2020-12-17 DIAGNOSIS — I4819 Other persistent atrial fibrillation: Secondary | ICD-10-CM | POA: Diagnosis not present

## 2020-12-17 LAB — CBC
HCT: 32.6 % — ABNORMAL LOW (ref 36.0–46.0)
Hemoglobin: 10.7 g/dL — ABNORMAL LOW (ref 12.0–15.0)
MCH: 30 pg (ref 26.0–34.0)
MCHC: 32.8 g/dL (ref 30.0–36.0)
MCV: 91.3 fL (ref 80.0–100.0)
Platelets: 172 10*3/uL (ref 150–400)
RBC: 3.57 MIL/uL — ABNORMAL LOW (ref 3.87–5.11)
RDW: 15 % (ref 11.5–15.5)
WBC: 8 10*3/uL (ref 4.0–10.5)
nRBC: 0 % (ref 0.0–0.2)

## 2020-12-17 LAB — BASIC METABOLIC PANEL
Anion gap: 5 (ref 5–15)
BUN: 25 mg/dL — ABNORMAL HIGH (ref 8–23)
CO2: 28 mmol/L (ref 22–32)
Calcium: 8.3 mg/dL — ABNORMAL LOW (ref 8.9–10.3)
Chloride: 103 mmol/L (ref 98–111)
Creatinine, Ser: 1.03 mg/dL — ABNORMAL HIGH (ref 0.44–1.00)
GFR, Estimated: 56 mL/min — ABNORMAL LOW (ref >60)
Glucose, Bld: 118 mg/dL — ABNORMAL HIGH (ref 70–99)
Potassium: 4.5 mmol/L (ref 3.5–5.1)
Sodium: 136 mmol/L (ref 135–145)

## 2020-12-17 LAB — APTT
aPTT: 178 seconds (ref 24–36)
aPTT: 82 seconds — ABNORMAL HIGH (ref 24–36)

## 2020-12-17 LAB — VITAMIN B12: Vitamin B-12: 479 pg/mL (ref 180–914)

## 2020-12-17 LAB — HEPARIN LEVEL (UNFRACTIONATED): Heparin Unfractionated: 0.94 IU/mL — ABNORMAL HIGH (ref 0.30–0.70)

## 2020-12-17 MED ORDER — LIDOCAINE 5 % EX PTCH
2.0000 | MEDICATED_PATCH | CUTANEOUS | Status: DC
  Administered 2020-12-17 – 2020-12-20 (×3): 2 via TRANSDERMAL
  Filled 2020-12-17 (×6): qty 2

## 2020-12-17 MED ORDER — FLUDROCORTISONE ACETATE 0.1 MG PO TABS
0.1000 mg | ORAL_TABLET | Freq: Every day | ORAL | Status: DC
  Administered 2020-12-17 – 2020-12-18 (×2): 0.1 mg via ORAL
  Filled 2020-12-17 (×4): qty 1

## 2020-12-17 MED ORDER — HEPARIN (PORCINE) 25000 UT/250ML-% IV SOLN
1150.0000 [IU]/h | INTRAVENOUS | Status: DC
  Administered 2020-12-17 – 2020-12-18 (×2): 850 [IU]/h via INTRAVENOUS
  Administered 2020-12-21: 1100 [IU]/h via INTRAVENOUS
  Administered 2020-12-22: 1150 [IU]/h via INTRAVENOUS
  Filled 2020-12-17 (×7): qty 250

## 2020-12-17 NOTE — Consult Note (Signed)
Cardiology Consultation:   Patient ID: Kayla Harrison MRN: [id number]; DOB: 10/31/1945  Admit date: 12/15/2020 Date of Consult: 12/17/2020  PCP:  [name], [place]  Cardiologist:  [name], MD   Patient Profile:   Kayla Harrison is a 75 y.o. female with a hx of atrial fibrillation, heart block s/p PPM, history of CVA, symptomatic orthostasis, lymphedema, prior lap band surgery, prior SVC syndrome, history of nonhodgkins lymphoma who is being seen today for the evaluation of orthostasis and frequent falls at the request of Dr. Eliseo Squires.  History of Present Illness:   [name] was last seen by Dr. Caryl Comes in the office on 08/16/20. He is very involved in her care, and I discussed her case with him today.  She presented via EMS on 12/15/20 after several falls at home. She fell 2 weeks prior to admission, then again a week later. Was on steroids for a foot sprain until 3/23. Overnight 3/24-3/25 had difficulty getting up to urinate, felt weak. First attempt getting up she slid to the floor, but second time she fell against the bookcase. Brought in by EMS, found to have elbow fracture, L hip hematoma without hip fracture. Her apixaban was held and was transitioned to heparin.  She had not had a fall for about a year until recently. She reports that she was feeling well on the prednisone burst until she was coming off of it, then had a horrible headache and felt terrible. Has also have severe leg pain after starting to use lymphedema pumps in January of this year, hurts to use the pumps and then has residual tenderness for days after. Gets very lightheaded bending over or looking up. No complete syncope, when she falls feels like her legs give out under her.  Past Medical History:  Diagnosis Date  . Anemia   . Arthritis    osteoarthritis - knees and right shoulder  . Blood transfusion without reported diagnosis   . Breast cancer ([place])    Dr  Margot Chimes, total thyroidectomy- 1999- for cancer  . 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 Chronic bilateral pleural effusions   . Colon polyp    Dr Earlean Shawl  . Complication of anesthesia    Ketamine produces LSD reaction, bright colored nightmarish experience   . Dyslipidemia   . Endometriosis   . Fibroids   . H/O pleural effusion    s/p thoracentesis w 3234ml withdrawn  . Hepatitis    [place] ?hepatitis   . History of dysphagia    due to radiation therapy  . History of hiatal hernia    small noted on PET scan  . Hypertension   . Hypothyroidism   . Lung cancer, lower lobe ([place]) 01/2017   radiation RX completed 03/04/17; will start chemo 6/27, pt unaware of lung cancer  . Morbid obesity ([place])    Status post lap band surgery  . Nephrolithiasis   . Non Hodgkin's lymphoma ([place])    on chemotherapy  . Persistent atrial fibrillation ([place])    a. s/p PVI 2008 b. s/p convergent ablation 9563 complicated by bradycardia requiring pacemaker implant  . Personal history of radiation therapy   . Presence of permanent cardiac pacemaker   . Rotator cuff tear    Right  . Stroke [facility])    2003- [place] x2  . SVC syndrome    with lung mass and non hodgkins lymphoma  . Thyroid cancer ([place]) 2000  Past Surgical History:  Procedure Laterality Date  . ABDOMINAL HYSTERECTOMY  1983  . afib ablation     a. 2008 PVI b. 2014 convergent ablation  . APPENDECTOMY    . BONE MARROW BIOPSY  02/21/2017  . BREAST LUMPECTOMY Left 2010  . bso  1998  . CARDIAC CATHETERIZATION     2015- negative  . CARDIOVERSION  10/09/2012   Procedure: CARDIOVERSION;  Surgeon: Minus Breeding, MD;  Location: [place];  Service: Cardiovascular;  Laterality: N/A;  . CARDIOVERSION  10/09/2012   Procedure: CARDIOVERSION;  Surgeon: Minus Breeding, MD;  Location: [facility] ENDOSCOPY;  Service: Cardiovascular;  Laterality: N/A;  [name] gave the ok to add pt to the add on , but we must check to find out if the can add pt on at  [phone] ( [phone]  . CARDIOVERSION N/A 11/20/2012   Procedure: CARDIOVERSION;  Surgeon: Fay Records, MD;  Location: [place];  Service: Cardiovascular;  Laterality: N/A;  . CARDIOVERSION N/A 07/18/2017   Procedure: CARDIOVERSION;  Surgeon: [place]ayer Headings, MD;  Location: [facility] ENDOSCOPY;  Service: Cardiovascular;  Laterality: N/A;  . CARDIOVERSION N/A 10/03/2017   Procedure: CARDIOVERSION;  Surgeon: Sanda Klein, MD;  Location: [facility] ENDOSCOPY;  Service: Cardiovascular;  Laterality: N/A;  . CARDIOVERSION N/A 01/07/2018   Procedure: CARDIOVERSION;  Surgeon: Thayer Headings, MD;  Location: [facility] ENDOSCOPY;  Service: Cardiovascular;  Laterality: N/A;  . CARDIOVERSION N/A 12/10/2019   Procedure: CARDIOVERSION;  Surgeon: [place] Dresser, MD;  Location: [facility] ENDOSCOPY;  Service: Cardiovascular;  Laterality: N/A;  . CHOLECYSTECTOMY    . COLONOSCOPY W/ POLYPECTOMY     Dr Earlean Shawl  . CYSTOSCOPY N/A 02/06/2015   Procedure: CYSTOSCOPY;  Surgeon: Kathie Rhodes, MD;  Location: [facility] ORS;  Service: Urology;  Laterality: N/A;  . CYSTOSCOPY W/ RETROGRADES Left 11/17/2017   Procedure: CYSTOSCOPY WITH RETROGRADE /PYELOGRAM/;  Surgeon: Kathie Rhodes, MD;  Location: [facility];  Service: Urology;  Laterality: Left;  . CYSTOSCOPY WITH RETROGRADE PYELOGRAM, URETEROSCOPY AND STENT PLACEMENT Right 02/06/2015   Procedure: RETROGRADE PYELOGRAM, RIGHT URETEROSCOPY STENT PLACEMENT;  Surgeon: Kathie Rhodes, MD;  Location: [facility];  Service: Urology;  Laterality: Right;  . CYSTOSCOPY WITH RETROGRADE PYELOGRAM, URETEROSCOPY AND STENT PLACEMENT Right 03/07/2017   Procedure: CYSTOSCOPY WITH RIGHT RETROGRADE PYELOGRAM,RIGHT  URETEROSCOPYLASER LITHOTRIPSY  AND STENT PLACEMENT AND STONE BASKETRY;  Surgeon: Kathie Rhodes, MD;  Location: [place];  Service: Urology;  Laterality: Right;  . EYE SURGERY     cataract surgery  . fatty mass removal  1999   pubic area  . HOLMIUM LASER APPLICATION N/A 5/63/1497   Procedure: HOLMIUM  LASER APPLICATION;  Surgeon: Kathie Rhodes, MD;  Location: [facility];  Service: Urology;  Laterality: N/A;  . HOLMIUM LASER APPLICATION Right 0/26/3785   Procedure: HOLMIUM LASER APPLICATION;  Surgeon: Kathie Rhodes, MD;  Location: [facility];  Service: Urology;  Laterality: Right;  . HOLMIUM LASER APPLICATION Left 8/85/0277   Procedure: HOLMIUM LASER APPLICATION;  Surgeon: Kathie Rhodes, MD;  Location: [facility];  Service: Urology;  Laterality: Left;  . IR FLUORO GUIDE PORT INSERTION RIGHT  02/24/2017  . IR NEPHROSTOMY PLACEMENT RIGHT  11/17/2017  . IR PATIENT EVAL TECH 0-60 MINS  03/11/2017  . IR REMOVAL TUN ACCESS W/ PORT W/O FL MOD SED  04/20/2018  . IR US GUIDE VASC ACCESS RIGHT  02/24/2017  . KNEE ARTHROSCOPY     bilateral  . LAPAROSCOPIC GASTRIC BANDING  07/10/2010  . LAPAROSCOPIC GASTRIC BANDING  Laparoscopic adjustable banding APS System with posterior hiatal hernia, 2 suture.  Marland Kitchen LAPAROTOMY     for ruptured ovary and ovarian artery   . NEPHROLITHOTOMY Right 11/17/2017   Procedure: NEPHROLITHOTOMY PERCUTANEOUS;  Surgeon: Kathie Rhodes, MD;  Location: [facility];  Service: Urology;  Laterality: Right;  . PACEMAKER INSERTION  03/10/2013   MDT dual chamber PPM  . POCKET REVISION N/A 12/08/2013   Procedure: POCKET REVISION;  Surgeon: Deboraha Sprang, MD;  Location: [facility] CATH LAB;  Service: Cardiovascular;  Laterality: N/A;  . PORTA CATH INSERTION    . REVERSE SHOULDER ARTHROPLASTY Right 05/14/2018   Procedure: RIGHT REVERSE SHOULDER ARTHROPLASTY;  Surgeon: Tania Ade, MD;  Location: [place];  Service: Orthopedics;  Laterality: Right;  . REVERSE SHOULDER REPLACEMENT Right 05/14/2018  . RIGHT HEART CATH N/A 07/21/2019   Procedure: RIGHT HEART CATH;  Surgeon: Larey Dresser, MD;  Location: [place] CV LAB;  Service: Cardiovascular;  Laterality: N/A;  . TEE WITH CARDIOVERSION  09/22/2017  . TEE WITHOUT CARDIOVERSION N/A 10/03/2017   Procedure: TRANSESOPHAGEAL ECHOCARDIOGRAM (TEE);   Surgeon: Sanda Klein, MD;  Location: [facility] ENDOSCOPY;  Service: Cardiovascular;  Laterality: N/A;  . TEE WITHOUT CARDIOVERSION N/A 08/04/2019   Procedure: TRANSESOPHAGEAL ECHOCARDIOGRAM (TEE);  Surgeon: [place]arey Dresser, MD;  Location: [facility] ENDOSCOPY;  Service: Cardiovascular;  Laterality: N/A;  . TEE WITHOUT CARDIOVERSION N/A 12/10/2019   Procedure: TRANSESOPHAGEAL ECHOCARDIOGRAM (TEE);  Surgeon: [place] Dresser, MD;  Location: [facility] ENDOSCOPY;  Service: Cardiovascular;  Laterality: N/A;  . THYROIDECTOMY  1998   Dr Margot Chimes  . TONSILLECTOMY    . TOTAL KNEE ARTHROPLASTY  04/13/2012   Procedure: TOTAL KNEE ARTHROPLASTY;  Surgeon: Rudean Haskell, MD;  Location: [place];  Service: Orthopedics;  Laterality: Right;  Marland Kitchen VIDEO BRONCHOSCOPY WITH ENDOBRONCHIAL ULTRASOUND N/A 02/07/2017   Procedure: VIDEO BRONCHOSCOPY WITH ENDOBRONCHIAL ULTRASOUND;  Surgeon: [place] Garfinkel, MD;  Location: [place];  Service: Pulmonary;  Laterality: N/A;     Home Medications:  Prior to Admission medications   Medication Sig Start Date End Date Taking? Authorizing Provider  acetaminophen (TYLENOL) 500 MG tablet Take 500-1,000 mg by mouth every 6 (six) hours as needed for mild pain or headache.   Yes [provider]  amiodarone (PACERONE) 200 MG tablet Take 1 tablet (200 mg total) by mouth daily. 03/28/20  Yes Deboraha Sprang, MD  calcium-vitamin D (OSCAL WITH D) 500-200 MG-UNIT tablet Take 2 tablets by mouth daily with breakfast. Patient taking differently: Take 1 tablet by mouth daily with breakfast. 12/05/19  Yes Gonfa, Charlesetta Ivory, MD  ELIQUIS 5 MG TABS tablet TAKE 1 TABLET BY MOUTH  TWICE DAILY Patient taking differently: Take 5 mg by mouth 2 (two) times daily. 06/13/20  Yes Deboraha Sprang, MD  furosemide (LASIX) 20 MG tablet Take 20 mg by mouth daily as needed for fluid or edema.  07/21/19  Yes [provider]  levothyroxine (SYNTHROID) 137 MCG tablet Take 68.5-137 mcg by mouth See admin instructions. Taking 137  mcg tablet daily except on Saturday taking 1/2 tablet   Yes [provider]  Lifitegrast 5 % SOLN Place 1 drop into both eyes at bedtime.    Yes [provider]  Multiple Vitamins-Minerals (ONE-A-DAY WOMENS 50+ ADVANTAGE) TABS Take 1 tablet by mouth daily with breakfast.   Yes [provider]  rosuvastatin (CRESTOR) 10 MG tablet Take 10 mg by mouth 2 (two) times a week. Takes on Monday and Thursday 06/30/18  Yes [provider]  senna (  SENOKOT) 8.6 MG tablet Take 1 tablet by mouth as needed for constipation.   Yes [provider]    Inpatient Medications: Scheduled Meds: . amiodarone  200 mg Oral Daily  . docusate sodium  100 mg Oral BID  . fludrocortisone  0.1 mg Oral Daily  . levothyroxine  137 mcg Oral Once per day on Sat   Or  . levothyroxine  68.5 mcg Oral Once per day on Sat  . lidocaine  2 patch Transdermal Q24H  . multivitamin with minerals  1 tablet Oral Daily  . [START ON 12/18/2020] rosuvastatin  10 mg Oral Once per day on Mon Thu   Continuous Infusions: . heparin 850 Units/hr (12/17/20 0903)   PRN Meds: acetaminophen **OR** acetaminophen, bisacodyl, HYDROcodone-acetaminophen, morphine injection, ondansetron **OR** ondansetron (ZOFRAN) IV, polyethylene glycol, senna  Allergies:    Allergies  Allergen Reactions  . Dofetilide Other (See Comments)    Prolonged QT interval   . Ranolazine     Balance issues  . Rivaroxaban Other (See Comments)    Nose Bleed X 6 hrs ; packing in ER Nasal hemorrhage  . Rivaroxaban     Nose Bleed X 6 hrs ; packing in ER Nasal hemorrhage  . Tape Other (See Comments)    SKIN IS THIN AND TEARS EASILY   . Tikosyn [Dofetilide] Other (See Comments)    "Long QT wave"   . Epinephrine Other (See Comments)    Oral anesthetic Dental form only (liquid). Patient stated she will become "out of it" she can hear you but cannot respond in normal fashion. "not fully with it"  . Epinephrine   . Ketamine  Other (See Comments)    Hallucinations   . Adhesive [Tape] Rash    Paper tape as well  . Ketamine Other (See Comments)    HALLUCINATIONS    Social History:   Social History   Socioeconomic History  . Marital status: Single    Spouse name: Not on file  . Number of children: 0  . Years of education: Not on file  . Highest education level: Master's degree (e.g., MA, MS, MEng, MEd, MSW, MBA)  Occupational History  . 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 Smoking status: Never Smoker  . Smokeless tobacco: Never Used  Vaping Use  . Vaping Use: Never used  Substance and Sexual Activity  . Alcohol use: No    Comment: none since 1990  . Drug use: Never  . Sexual activity: Not Currently    Birth control/protection: Surgical  Other Topics Concern  . Not on file  Social History Narrative   ** Merged History Encounter **       SINGLE  NO CHILDREN NEVER SMOKED EXERCISE 3 X WK RETIRED NO CAFFEINE      Social Determinants of Health   Financial Resource Strain: Not on file  Food Insecurity: Not on file  Transportation Needs: Not on file  Physical Activity: Not on file  Stress: Not on file  Social Connections: Not on file  Intimate Partner Violence: Not on file    Family History:    Family History  Problem Relation Age of Onset  . Heart disease Father 38       MI $R'@autopsy'Er$   . Colon cancer Father        COLON  . Heart attack Father   . Other Mother        temporal arteritis   . Diabetes  Sister   . Diabetes Brother   . Diabetes Paternal Aunt   . Diabetes Paternal Grandmother   . Esophageal cancer Neg Hx   . Inflammatory bowel disease Neg Hx   . Liver disease Neg Hx   . Pancreatic cancer Neg Hx   . Stomach cancer Neg Hx      ROS:  Please see the history of present illness.  Constitutional: Negative for chills, fever, night sweats, unintentional weight loss  HENT: Negative for ear pain and hearing loss.    Eyes: Negative for loss of vision and eye pain.  Respiratory: Negative for cough, sputum, wheezing.   Cardiovascular: See HPI. Gastrointestinal: Negative for abdominal pain, melena, and hematochezia.  Genitourinary: Negative for dysuria and hematuria.  Musculoskeletal: Positive for falls, generalized weakness Skin: Negative for itching and rash.  Neurological: Positive for falls, generalized weakness Endo/Heme/Allergies: Does bruise/bleed easily.  All other ROS reviewed and negative.     Physical Exam/Data:   Vitals:   12/16/20 0726 12/16/20 1500 12/16/20 2143 12/17/20 0836  BP: (!) 108/51 110/69 114/68 (!) 107/53  Pulse: 69 67 61 70  Resp: $Remo'15 16 18 17  'XvFVz$ Temp: 98.3 F (36.8 C) 98.3 F (36.8 C) 98.7 F (37.1 C) 98.2 F (36.8 C)  TempSrc: Oral Oral Oral Oral  SpO2: 98% 99% 95% 100%  Weight:      Height:        Intake/Output Summary (Last 24 hours) at 12/17/2020 1046 Last data filed at 12/17/2020 0900 Gross per 24 hour  Intake 720 ml  Output 900 ml  Net -180 ml   Last 3 Weights 12/15/2020 12/15/2020 10/12/2020  Weight (lbs) 204 lb 207 lb 3.7 oz 207 lb 6.4 oz  Weight (kg) 92.534 kg 94 kg 94.076 kg     Body mass index is 32.93 kg/m.  General:  Well nourished, well developed, in no acute distress HEENT: normal Neck: no JVD Endocrine:  No thryomegaly Vascular: No carotid bruits; RA pulses 2+ bilaterally  Cardiac:  normal S1, S2; RRR; no murmur  Lungs:  clear to auscultation bilaterally, no wheezing, rhonchi or rales  Abd: soft, nontender, no hepatomegaly  Ext: Chronic bilateral LE lymphedema, small at ankles to mid calf then severely enlarged. Musculoskeletal:  No deformities, BUE and BLE strength normal and equal. Scattered bruising, worst on L hip Skin: warm and dry  Neuro:  CNs 2-12 intact, no focal abnormalities noted Psych:  Normal affect   Orthostatic VS for the past 24 hrs (Last 3 readings):  BP- Lying Pulse- Lying BP- Sitting Pulse- Sitting  12/16/20 1500 110/69  67 (!) 85/43 68    EKG:  The EKG was personally reviewed and demonstrates:  Normal sinus rhythm Telemetry:  Telemetry was personally reviewed and demonstrates:  Intermittently sinus rhythm and atrial paced rhythm, predominantly atrial paced  Relevant CV Studies: Echo 12/03/19 1. Left ventricular ejection fraction, by estimation, is 55 to 60%. The  left ventricle has normal function. The left ventricle has no regional  wall motion abnormalities. Left ventricular diastolic parameters are  consistent with Grade II diastolic  dysfunction (pseudonormalization).  2. Right ventricular systolic function is normal. The right ventricular  size is normal. There is mildly elevated pulmonary artery systolic  pressure.  3. Right atrial size was mildly dilated.  4. The mitral valve is grossly normal. Mild mitral valve regurgitation.  No evidence of mitral stenosis.  5. Tricuspid valve regurgitation is mild to moderate.  6. The aortic valve is normal in structure. Aortic valve  regurgitation is  not visualized. No aortic stenosis is present.   Laboratory Data:  High Sensitivity Troponin:  No results for input(s): TROPONINIHS in the last 720 hours.   Chemistry Recent Labs  Lab 12/15/20 0559 12/16/20 0251 12/17/20 0122  NA 134* 135 136  K 4.7 4.5 4.5  CL 101 100 103  CO2 $Re'24 27 28  'kjY$ GLUCOSE 104* 94 118*  BUN 21 19 25*  CREATININE 0.92 0.99 1.03*  CALCIUM 8.6* 8.5* 8.3*  GFRNONAA >60 59* 56*  ANIONGAP $RemoveB'9 8 5    'CqUuPUYI$ No results for input(s): PROT, ALBUMIN, AST, ALT, ALKPHOS, BILITOT in the last 168 hours. Hematology Recent Labs  Lab 12/15/20 0559 12/16/20 0251 12/17/20 0122  WBC 8.3 6.3 8.0  RBC 4.40 4.09 3.57*  HGB 13.4 12.1 10.7*  HCT 41.5 38.5 32.6*  MCV 94.3 94.1 91.3  MCH 30.5 29.6 30.0  MCHC 32.3 31.4 32.8  RDW 15.4 15.2 15.0  PLT 171 173 172   BNPNo results for input(s): BNP, PROBNP in the last 168 hours.  DDimer No results for input(s): DDIMER in the last 168  hours.   Radiology/Studies:  DG Ribs Unilateral W/Chest Right  Result Date: 12/15/2020 CLINICAL DATA:  Fall. EXAM: RIGHT RIBS AND CHEST - 3+ VIEW COMPARISON:  10/12/2020 FINDINGS: Cardiomegaly. Dual-chamber pacer leads in place. Left atrial clipping. Interstitial prominence which is stable. There is paramediastinal scarring on a 2021 chest CT. No hemothorax or pneumothorax. Lateral right seventh and eighth rib fractures which are nondisplaced and marked on the images. Reverse glenohumeral arthroplasty on the right. Lap band. Postoperative thoracic inlet and left breast. IMPRESSION: Nondisplaced right seventh and eighth rib fractures. No hemothorax or pneumothorax. Electronically Signed   By: Monte Fantasia M.D.   On: 12/15/2020 06:52   DG Elbow Complete Left  Result Date: 12/15/2020 CLINICAL DATA:  Fall EXAM: LEFT ELBOW - COMPLETE 3+ VIEW COMPARISON:  None. FINDINGS: The osseous structures appear diffusely demineralized which may limit detection of small or nondisplaced fractures. Nondisplaced fracture through the coronoid process. Questionable lucency through the lateral aspect of the radial head. Degenerative changes about the elbow with enthesopathic changes on the medial epicondyle. Vascular calcium the soft tissues. Soft tissue swelling of the elbow is present. IMPRESSION: 1. Nondisplaced fracture through the coronoid process. 2. Questionable lucency versus projection over the lateral aspect of the radial head, correlate for point tenderness to exclude acute fracture. 3. Degenerative changes about the elbow with enthesopathic changes on the medial epicondyle. 4. Minimal soft tissue swelling, no sizeable effusion. Electronically Signed   By: Lovena Le M.D.   On: 12/15/2020 06:57   CT Head Wo Contrast  Result Date: 12/15/2020 CLINICAL DATA:  Fall, on anticoagulation EXAM: CT HEAD WITHOUT CONTRAST CT CERVICAL SPINE WITHOUT CONTRAST TECHNIQUE: Multidetector CT imaging of the head and cervical  spine was performed following the standard protocol without intravenous contrast. Multiplanar CT image reconstructions of the cervical spine were also generated. COMPARISON:  MR head and cervical spine 07/29/2018, CT head and cervical spine 12/02/2018 FINDINGS: CT HEAD FINDINGS Brain: No evidence of acute infarction, hemorrhage, hydrocephalus, extra-axial collection, visible mass lesion or mass effect. Symmetric prominence of the ventricles, cisterns and sulci compatible with parenchymal volume loss. Mixed patchy and confluent areas of white matter hypoattenuation are most compatible with chronic microvascular angiopathy. Vascular: Atherosclerotic calcification of the carotid siphons and intradural vertebral arteries. No hyperdense vessel. Skull: No calvarial fracture or suspicious osseous lesion. No scalp swelling or hematoma. Sinuses/Orbits: Paranasal sinuses and mastoid  air cells are predominantly clear. Orbital structures are unremarkable aside from prior lens extractions. Other: Bilateral TMJ arthrosis, right greater than left. CT CERVICAL SPINE FINDINGS Alignment: Stabilization collar in place at the time of exam. Straightening of the normal cervical lordosis. Focal reversal noted at the C5-6 level. No evidence of traumatic listhesis. No abnormally widened, perched or jumped facets. Normal alignment of the craniocervical and atlantoaxial articulations accounting for cranial positioning and atlantodental arthrosis. Skull base and vertebrae: No acute skull base fracture. No vertebral body fracture or height loss. The osseous structures appear diffusely demineralized which may limit detection of small or nondisplaced fractures. Stable appearance of slightly more focal lucency in the C3 and T1 posterosuperior corner, likely vertebral body hemangiomata or demineralization without concerning features on comparison MR imaging. Moderate arthrosis at the atlantodental and basion dens intervals, stable prior. Multilevel  cervical spondylitic changes, as detailed below. Soft tissues and spinal canal: No pre or paravertebral fluid or swelling. No visible canal hematoma. Airways patent. Disc levels: Multilevel intervertebral disc height loss with spondylitic endplate changes. Features maximal C5-C7 with larger disc osteophyte complexes at the C5-6, C6-7 levels resulting and moderate canal stenoses. Additional uncinate spurring facet hypertrophic changes throughout the cervical spine result in mild multilevel neural foraminal narrowing. Upper chest: Biapical pleuroparenchymal scarring is noted. Postsurgical changes at the base of the neck likely reflecting prior thyroidectomy. Calcification nodes. Other: None. IMPRESSION: 1. No acute intracranial abnormality. No scalp swelling or calvarial fracture. 2. Stable parenchymal volume loss and chronic microvascular ischemic white matter disease. 3. No acute cervical spine fracture or traumatic listhesis. 4. Multilevel cervical spondylitic and facet degenerative changes, maximal C5-C7 where there is moderate canal stenoses and mild multilevel neural foraminal narrowing. 5. Stable appearance of slightly more focal lucency in the C3 and T1 posterosuperior corner, likely vertebral body hemangiomata or demineralization without concerning features on interval comparison MR imaging. Electronically Signed   By: Kreg Shropshire M.D.   On: 12/15/2020 06:36   CT Chest Wo Contrast  Result Date: 12/15/2020 CLINICAL DATA:  Rib fractures.  History of non-Hodgkin's lymphoma. EXAM: CT CHEST WITHOUT CONTRAST TECHNIQUE: Multidetector CT imaging of the chest was performed following the standard protocol without IV contrast. COMPARISON:  August 21, 2020. FINDINGS: Cardiovascular: Atherosclerosis of thoracic aorta is noted without aneurysm formation. Normal cardiac size. No pericardial effusion. Left-sided pacemaker is in grossly good position. Coronary artery calcifications are noted. Mediastinum/Nodes: Small  sliding-type hiatal hernia. Thyroid gland is unremarkable. No definite adenopathy is noted, although evaluation is significantly limited due the lack of intravenous contrast. Lungs/Pleura: No pneumothorax or significant pleural effusion is noted. Mild bilateral posterior basilar subsegmental atelectasis is noted. Probable scarring or fibrosis is noted in the right upper lobe. Upper Abdomen: Lap band device is noted. Bilateral nonobstructive nephrolithiasis is noted. Musculoskeletal: No chest wall mass or suspicious bone lesions identified. Status post right total shoulder arthroplasty. IMPRESSION: 1. No definite rib fracture is noted. 2. Coronary artery calcifications are noted suggesting coronary artery disease. 3. Small sliding-type hiatal hernia. 4. Bilateral nonobstructive nephrolithiasis. 5. Probable scarring or fibrosis is noted in the right upper lobe. 6. Aortic atherosclerosis. Aortic Atherosclerosis (ICD10-I70.0). Electronically Signed   By: Lupita Raider M.D.   On: 12/15/2020 09:28   CT Cervical Spine Wo Contrast  Result Date: 12/15/2020 CLINICAL DATA:  Fall, on anticoagulation EXAM: CT HEAD WITHOUT CONTRAST CT CERVICAL SPINE WITHOUT CONTRAST TECHNIQUE: Multidetector CT imaging of the head and cervical spine was performed following the standard protocol without intravenous  contrast. Multiplanar CT image reconstructions of the cervical spine were also generated. COMPARISON:  MR head and cervical spine 07/29/2018, CT head and cervical spine 12/02/2018 FINDINGS: CT HEAD FINDINGS Brain: No evidence of acute infarction, hemorrhage, hydrocephalus, extra-axial collection, visible mass lesion or mass effect. Symmetric prominence of the ventricles, cisterns and sulci compatible with parenchymal volume loss. Mixed patchy and confluent areas of white matter hypoattenuation are most compatible with chronic microvascular angiopathy. Vascular: Atherosclerotic calcification of the carotid siphons and intradural  vertebral arteries. No hyperdense vessel. Skull: No calvarial fracture or suspicious osseous lesion. No scalp swelling or hematoma. Sinuses/Orbits: Paranasal sinuses and mastoid air cells are predominantly clear. Orbital structures are unremarkable aside from prior lens extractions. Other: Bilateral TMJ arthrosis, right greater than left. CT CERVICAL SPINE FINDINGS Alignment: Stabilization collar in place at the time of exam. Straightening of the normal cervical lordosis. Focal reversal noted at the C5-6 level. No evidence of traumatic listhesis. No abnormally widened, perched or jumped facets. Normal alignment of the craniocervical and atlantoaxial articulations accounting for cranial positioning and atlantodental arthrosis. Skull base and vertebrae: No acute skull base fracture. No vertebral body fracture or height loss. The osseous structures appear diffusely demineralized which may limit detection of small or nondisplaced fractures. Stable appearance of slightly more focal lucency in the C3 and T1 posterosuperior corner, likely vertebral body hemangiomata or demineralization without concerning features on comparison MR imaging. Moderate arthrosis at the atlantodental and basion dens intervals, stable prior. Multilevel cervical spondylitic changes, as detailed below. Soft tissues and spinal canal: No pre or paravertebral fluid or swelling. No visible canal hematoma. Airways patent. Disc levels: Multilevel intervertebral disc height loss with spondylitic endplate changes. Features maximal C5-C7 with larger disc osteophyte complexes at the C5-6, C6-7 levels resulting and moderate canal stenoses. Additional uncinate spurring facet hypertrophic changes throughout the cervical spine result in mild multilevel neural foraminal narrowing. Upper chest: Biapical pleuroparenchymal scarring is noted. Postsurgical changes at the base of the neck likely reflecting prior thyroidectomy. Calcification nodes. Other: None.  IMPRESSION: 1. No acute intracranial abnormality. No scalp swelling or calvarial fracture. 2. Stable parenchymal volume loss and chronic microvascular ischemic white matter disease. 3. No acute cervical spine fracture or traumatic listhesis. 4. Multilevel cervical spondylitic and facet degenerative changes, maximal C5-C7 where there is moderate canal stenoses and mild multilevel neural foraminal narrowing. 5. Stable appearance of slightly more focal lucency in the C3 and T1 posterosuperior corner, likely vertebral body hemangiomata or demineralization without concerning features on interval comparison MR imaging. Electronically Signed   By: Lovena Le M.D.   On: 12/15/2020 06:36   CT Hip Left Wo Contrast  Result Date: 12/15/2020 CLINICAL DATA:  [place].  Left hip pain. EXAM: CT OF THE LEFT HIP WITHOUT CONTRAST TECHNIQUE: Multidetector CT imaging of the left hip was performed according to the standard protocol. Multiplanar CT image reconstructions were also generated. COMPARISON:  Radiographs 12/15/2020 FINDINGS: The left hip is normally located. No acute hip fracture is identified. There are moderate degenerative changes. No evidence of AVN. The left hemipelvic bony structures are intact. No pelvic fractures. There is a large subcutaneous hematoma overlying the left hip. I do not see an obvious muscle tear or intramuscular hematoma. No significant left-sided intrapelvic abnormalities are identified. Incidental note is made of diverticulosis and moderate to advanced vascular calcifications. IMPRESSION: 1. Large subcutaneous hematoma overlying the left hip. 2. No acute hip or left-sided pelvic fractures. 3. Moderate degenerative changes involving the left hip. 4. Aortic atherosclerosis. Aortic  Atherosclerosis (ICD10-I70.0). Electronically Signed   By: Marijo Sanes M.D.   On: 12/15/2020 09:19   DG Hip Unilat W or Wo Pelvis 2-3 Views Left  Result Date: 12/15/2020 CLINICAL DATA:  Left hip and elbow pain due to  fall EXAM: DG HIP (WITH OR WITHOUT PELVIS) 2-3V LEFT COMPARISON:  None. FINDINGS: Lateral left hip swelling and/or hematoma. The osseous structures appear diffusely demineralized which may limit detection of small or nondisplaced fractures. No acute bony abnormality. Specifically, no fracture, subluxation, or dislocation. Degenerative changes present in the lower lumbar spine, bilateral hips and pelvis. Extensive atherosclerotic calcifications throughout the abdomen pelvis. Catheter tubing projects over the mid abdomen. IMPRESSION: Lateral left hip swelling and/or hematoma. No acute osseous abnormality. Degenerative changes in the spine, hips and pelvis. Electronically Signed   By: Lovena Le M.D.   On: 12/15/2020 06:53     Assessment and Plan:   Orthostatic hypotension: -suspect this is the etiology of her falls -Discussed with Dr. Caryl Comes. Recommended thigh compression, abdominal binder, out of bed, florinef -agree with PT/home health -should be out of bed as much as possible, as this could contribute to her orthostasis -interesting that she felt better on steroid burst. May feel better with florinef, but may also need to consider proamantine or midodrine if orthostasis persists  Atrial fibrillation, paroxysmal/persistent S/P ablation with complication of complete heart block, s/p permanent pacemaker History of CVA -CHA2DS2/VAS Stroke Risk Points=at least 5, though reports prior history of remote heart failure around the time of her complex ablation -discussed with Dr. Caryl Comes, who knows her well. Given her elevated stroke risk/history of prior stroke, would continue anticoagulation. Will aim to treat orthostasis to improve falls risk. -limited options for management, continue amiodarone -if Hgb remains stable, would change back to apixaban if no interventions planned. Hgb 10.7 today   Risk Assessment/Risk Scores:   For questions or updates, please contact [place] Please consult  [email or web address] for contact info under    Signed, Buford Dresser, MD  12/17/2020 10:46 AM

## 2020-12-17 NOTE — Progress Notes (Signed)
Progress Note    Kayla Harrison  RFF:[id number] DOB: [birth year]  DOA: 12/15/2020 PCP: [name], MD    Brief Narrative:     Medical records reviewed and are as summarized below:  Kayla Harrison is an 75 y.o. female with medical history significant of thyroid cancer/lung cancer/NHL with h/o SVC syndrome; CVA; pacemaker placement; afib on Eliquis; obesity s/p lap band surgery; hypothyroidism; HTN; and HLD presenting with a fall.  "It's a complicated story, which is why I wanted to be admitted". She has been very unsteady. Fell 2 weeks ago by the garbage can. [place] again a week later, twisted her foot and fell into the recliner. She has been getting weaker. She went to ortho and was told she sprained her foot; she was given steroids and finished these Wednesday. That AM, she started having severe pain on her B shins. She has lymphedema and has been using a leg pump - ?related. She started getting very weak yesterday and in the afternoon she was having significant trouble walking. She felt like she had no energy and had difficulty getting up with her walker. She had to urinate overnight and fell when she tried to stand, slid into the floor. She got up again about 4AM and she was too weak to walk once again. She fell against the bookcase and damaged her hip, elbow, and ribs. She has had a "wicked headache" for 2 days. She had prior CVA in 2002, but is taking Eliquis. Her inability to think, act yesterday was frustrating yesterday. No dysphagia or dysarthria. B weakness. She has a biochemistry and medical technology background and so "has significant medical knowledge and understands medical problems" better than most people.   Assessment/Plan:   Principal Problem:   Fall Active Problems:   HYPERLIPIDEMIA   Hypothyroidism   Non-Hodgkin lymphoma, unspecified, intrathoracic lymph nodes (HCC)   Persistent atrial fibrillation (HCC)   Pacemaker   Hip hematoma, left,  initial encounter   Nondisplaced fracture of coronoid process of left ulna, initial encounter for closed fracture   Multiple rib fractures   Orthostatic hypotension   History of CVA (cerebrovascular accident)   Falls suspected due to orthostatic hypotension -Patient with recurrent falls -According to her, she has had new-onset weakness and this is the source of her falls -According to her sister, she has had several falls dating back a number of months with gradually worsening weakness -Also with ? Of cognitive impairment - patient denies but her sister appears to recognize this -Patient is adamant that she is safe to return to home but wants to know why she is falling -Will request PT/OT/ST (cognitive) evaluations -follows with Dr. Caryl Comes and seems to have a h/o orthostatic hypotension although patient describes her falling as "legs giving out" at times and during other times "losing balance" -cards consult: TED hose, florinef, abdominal binder -sleep HOB elevated -OOB  Afib -Hold Eliquis given large hematoma -Will transition to Heparin drip for now and monitor -Continue Amio for rate control -Has pacemaker -cards consult  L hip hematoma -No obvious MSK injury other than hematoma -Will follow on Heparin drip -trend Hgb-- down to 10  L elbow fracture - splint placed -outpatient follow up?  Possible R rib fractures -Xray with lateral R 7/8 rib fractures which were not seen on CT -Lidocaine patch   HLD -Continue Crestor  Hypothyroidism -Normal TSH in 07/2020 -Continue Synthroid at current dose for now  Class 1 obesity -Body mass index is 32.93 kg/m.  -  She is s/p lap band  H/o cancer -NHL with large mediastinal mass involving thyroid/lungs -Seen by Dr. Lindi Adie in 12/021 and no longer needs routine imaging -Annual f/u   Family Communication/Anticipated D/C date and plan/Code Status   DVT prophylaxis: heparin gtt Code Status: Full Code.  Disposition  Plan: Status is: Inpatient  Remains inpatient appropriate because:Inpatient level of care appropriate due to severity of illness   Dispo: The patient is from: Home              Anticipated d/c is to: tbd              Patient currently is not medically stable to d/c.   Difficult to place patient No         Medical Consultants:   cards    Subjective:   C/o discomfort in left hip   Objective:    Vitals:   12/16/20 0726 12/16/20 1500 12/16/20 2143 12/17/20 0836  BP: (!) 108/51 110/69 114/68 (!) 107/53  Pulse: 69 67 61 70  Resp: 15 16 18 17   Temp: 98.3 F (36.8 C) 98.3 F (36.8 C) 98.7 F (37.1 C) 98.2 F (36.8 C)  TempSrc: Oral Oral Oral Oral  SpO2: 98% 99% 95% 100%  Weight:      Height:        Intake/Output Summary (Last 24 hours) at 12/17/2020 1113 Last data filed at 12/17/2020 0900 Gross per 24 hour  Intake 720 ml  Output 900 ml  Net -180 ml   Filed Weights   12/15/20 0552 12/15/20 1257  Weight: 94 kg 92.5 kg    Exam:  General: Appearance:    Obese female in no acute distress     Lungs:     respirations unlabored  Heart:    Normal heart rate. Irregularly irregular rhythm. No murmurs, rubs, or gallops.   MS:   All extremities are intact. Large bruise on left hip   Neurologic:   Awake, alert, oriented x 3. No apparent focal neurological           defect.      Data Reviewed:   I have personally reviewed following labs and imaging studies:  Labs: Labs show the following:   Basic Metabolic Panel: Recent Labs  Lab 12/15/20 0559 12/16/20 0251 12/17/20 0122  NA 134* 135 136  K 4.7 4.5 4.5  CL 101 100 103  CO2 24 27 28   GLUCOSE 104* 94 118*  BUN 21 19 25*  CREATININE 0.92 0.99 1.03*  CALCIUM 8.6* 8.5* 8.3*   GFR Estimated Creatinine Clearance: 53.3 mL/min (A) (by C-G formula based on SCr of 1.03 mg/dL (H)). Liver Function Tests: No results for input(s): AST, ALT, ALKPHOS, BILITOT, PROT, ALBUMIN in the last 168 hours. No results for  input(s): LIPASE, AMYLASE in the last 168 hours. No results for input(s): AMMONIA in the last 168 hours. Coagulation profile No results for input(s): INR, PROTIME in the last 168 hours.  CBC: Recent Labs  Lab 12/15/20 0559 12/16/20 0251 12/17/20 0122  WBC 8.3 6.3 8.0  NEUTROABS 6.2  --   --   HGB 13.4 12.1 10.7*  HCT 41.5 38.5 32.6*  MCV 94.3 94.1 91.3  PLT 171 173 172   Cardiac Enzymes: No results for input(s): CKTOTAL, CKMB, CKMBINDEX, TROPONINI in the last 168 hours. BNP (last 3 results) No results for input(s): PROBNP in the last 8760 hours. CBG: No results for input(s): GLUCAP in the last 168 hours. D-Dimer: No results for  input(s): DDIMER in the last 72 hours. Hgb A1c: No results for input(s): HGBA1C in the last 72 hours. Lipid Profile: No results for input(s): CHOL, HDL, LDLCALC, TRIG, CHOLHDL, LDLDIRECT in the last 72 hours. Thyroid function studies: No results for input(s): TSH, T4TOTAL, T3FREE, THYROIDAB in the last 72 hours.  Invalid input(s): FREET3 Anemia work up: Recent Labs    12/17/20 0122  VITAMINB12 479   Sepsis Labs: Recent Labs  Lab 12/15/20 0559 12/16/20 0251 12/17/20 0122  WBC 8.3 6.3 8.0    Microbiology Recent Results (from the past 240 hour(s))  Resp Panel by RT-PCR (Flu A&B, Covid) Nasopharyngeal Swab     Status: None   Collection Time: 12/15/20  8:44 AM   Specimen: Nasopharyngeal Swab; Nasopharyngeal(NP) swabs in vial transport medium  Result Value Ref Range Status   SARS Coronavirus 2 by RT PCR NEGATIVE NEGATIVE Final    Comment: (NOTE) SARS-CoV-2 target nucleic acids are NOT DETECTED.  The SARS-CoV-2 RNA is generally detectable in upper respiratory specimens during the acute phase of infection. The lowest concentration of SARS-CoV-2 viral copies this assay can detect is 138 copies/mL. A negative result does not preclude SARS-Cov-2 infection and should not be used as the sole basis for treatment or other patient management  decisions. A negative result may occur with  improper specimen collection/handling, submission of specimen other than nasopharyngeal swab, presence of viral mutation(s) within the areas targeted by this assay, and inadequate number of viral copies(<138 copies/mL). A negative result must be combined with clinical observations, patient history, and epidemiological information. The expected result is Negative.  Fact Sheet for Patients:  EntrepreneurPulse.com.au  Fact Sheet for Healthcare Providers:  IncredibleEmployment.be  This test is no t yet approved or cleared by the [place] FDA and  has been authorized for detection and/or diagnosis of SARS-CoV-2 by FDA under an Emergency Use Authorization (EUA). This EUA will remain  in effect (meaning this test can be used) for the duration of the COVID-19 declaration under Section 564(b)(1) of the Act, 21 U.S.C.section 360bbb-3(b)(1), unless the authorization is terminated  or revoked sooner.       Influenza A by PCR NEGATIVE NEGATIVE Final   Influenza B by PCR NEGATIVE NEGATIVE Final    Comment: (NOTE) The Xpert Xpress SARS-CoV-2/FLU/RSV plus assay is intended as an aid in the diagnosis of influenza from Nasopharyngeal swab specimens and should not be used as a sole basis for treatment. Nasal washings and aspirates are unacceptable for Xpert Xpress SARS-CoV-2/FLU/RSV testing.  Fact Sheet for Patients: EntrepreneurPulse.com.au  Fact Sheet for Healthcare Providers: IncredibleEmployment.be  This test is not yet approved or cleared by the [place] FDA and has been authorized for detection and/or diagnosis of SARS-CoV-2 by FDA under an Emergency Use Authorization (EUA). This EUA will remain in effect (meaning this test can be used) for the duration of the COVID-19 declaration under Section 564(b)(1) of the Act, 21 U.S.C. section 360bbb-3(b)(1), unless the  authorization is terminated or revoked.  Performed at [facility], [place] [address] [place], [place] [address]     Procedures and diagnostic studies:  No results found.  Medications:   . amiodarone  200 mg Oral Daily  . docusate sodium  100 mg Oral BID  . fludrocortisone  0.1 mg Oral Daily  . levothyroxine  137 mcg Oral Once per day on Sat   Or  . levothyroxine  68.5 mcg Oral Once per day on Sat  . lidocaine  2 patch Transdermal Q24H  .  multivitamin with minerals  1 tablet Oral Daily  . [START ON 12/18/2020] rosuvastatin  10 mg Oral Once per day on Mon Thu   Continuous Infusions: . heparin 850 Units/hr (12/17/20 0903)     LOS: 2 days   Geradine Girt  [facility]   How to contact the [facility] Attending or Consulting provider [place] or covering provider during after hours [place], for this patient?  1. Check the care team in [facility] and look for a) attending/consulting [facility] provider listed and b) the [facility] team listed 2. Log into [email or web address] and use [place]'s universal password to access. If you do not have the password, please contact the hospital operator. 3. Locate the [facility] provider you are looking for under [facility] and page to a number that you can be directly reached. 4. If you still have difficulty reaching the provider, please page the [facility] (Director on Call) for the Hospitalists listed on amion for assistance.  12/17/2020, 11:13 AM

## 2020-12-17 NOTE — Progress Notes (Signed)
[place] for heparin Indication: atrial fibrillation  Allergies  Allergen Reactions  . Dofetilide Other (See Comments)    Prolonged QT interval   . Ranolazine     Balance issues  . Rivaroxaban Other (See Comments)    Nose Bleed X 6 hrs ; packing in ER Nasal hemorrhage  . Rivaroxaban     Nose Bleed X 6 hrs ; packing in ER Nasal hemorrhage  . Tape Other (See Comments)    SKIN IS THIN AND TEARS EASILY   . Tikosyn [Dofetilide] Other (See Comments)    "Long QT wave"   . Epinephrine Other (See Comments)    Oral anesthetic Dental form only (liquid). Patient stated she will become "out of it" she can hear you but cannot respond in normal fashion. "not fully with it"  . Epinephrine   . Ketamine Other (See Comments)    Hallucinations   . Adhesive [Tape] Rash    Paper tape as well  . Ketamine Other (See Comments)    HALLUCINATIONS    Patient Measurements: Height: 5\' 6"  (167.6 cm) Weight: 92.5 kg (204 lb) IBW/kg (Calculated) : 59.3 Heparin Dosing Weight: 80kg  Vital Signs: Temp: 98.6 F (37 C) (03/27 2030) Temp Source: Oral (03/27 2030) BP: 102/55 (03/27 2030) Pulse Rate: 70 (03/27 2030)  Labs: Recent Labs    12/15/20 0559 12/15/20 0559 12/16/20 0251 12/16/20 1243 12/17/20 0122 12/17/20 0638 12/17/20 1914  HGB 13.4  --  12.1  --  10.7*  --   --   HCT 41.5  --  38.5  --  32.6*  --   --   PLT 171  --  173  --  172  --   --   APTT  --    < > 29 74*  --  178* 82*  HEPARINUNFRC  --   --  0.59 0.69 0.94*  --   --   CREATININE 0.92  --  0.99  --  1.03*  --   --    < > = values in this interval not displayed.    Estimated Creatinine Clearance: 53.3 mL/min (A) (by C-G formula based on SCr of 1.03 mg/dL (H)).  Assessment: 49 YOF presenting s/p fall, on Eliquis PTA for afib with last dose taken 3/24 @1900 .  CT head negative. Pharmacy to dose heparin. -aPTT= 82   Goal of Therapy:  Heparin level 0.3-0.5 units/ml aPTT 66-85  seconds Monitor platelets by anticoagulation protocol: Yes   Plan:  -Continue heparin at 850 units/hr -Recheck heparin level, aPTT and CBC in am  Hildred Laser, PharmD Clinical Pharmacist **Pharmacist phone directory can now be found on [email or web address] ([email or web address]).  Listed under [place].

## 2020-12-17 NOTE — Progress Notes (Signed)
[place] for heparin Indication: atrial fibrillation  Allergies  Allergen Reactions  . Dofetilide Other (See Comments)    Prolonged QT interval   . Ranolazine     Balance issues  . Rivaroxaban Other (See Comments)    Nose Bleed X 6 hrs ; packing in ER Nasal hemorrhage  . Rivaroxaban     Nose Bleed X 6 hrs ; packing in ER Nasal hemorrhage  . Tape Other (See Comments)    SKIN IS THIN AND TEARS EASILY   . Tikosyn [Dofetilide] Other (See Comments)    "Long QT wave"   . Epinephrine Other (See Comments)    Oral anesthetic Dental form only (liquid). Patient stated she will become "out of it" she can hear you but cannot respond in normal fashion. "not fully with it"  . Epinephrine   . Ketamine Other (See Comments)    Hallucinations   . Adhesive [Tape] Rash    Paper tape as well  . Ketamine Other (See Comments)    HALLUCINATIONS    Patient Measurements: Height: 5\' 6"  (167.6 cm) Weight: 92.5 kg (204 lb) IBW/kg (Calculated) : 59.3 Heparin Dosing Weight: 80kg  Vital Signs: Temp: 98.7 F (37.1 C) (03/26 2143) Temp Source: Oral (03/26 2143) BP: 114/68 (03/26 2143) Pulse Rate: 61 (03/26 2143)  Labs: Recent Labs    12/15/20 0559 12/16/20 0251 12/16/20 1243 12/17/20 0122 12/17/20 0638  HGB 13.4 12.1  --  10.7*  --   HCT 41.5 38.5  --  32.6*  --   PLT 171 173  --  172  --   APTT  --  29 74*  --  178*  HEPARINUNFRC  --  0.59 0.69 0.94*  --   CREATININE 0.92 0.99  --  1.03*  --     Estimated Creatinine Clearance: 53.3 mL/min (A) (by C-G formula based on SCr of 1.03 mg/dL (H)).  Assessment: 42 YOF presenting s/p fall, on Eliquis PTA for afib with last dose taken 3/24 @1900 .  CT head negative, elbow/rib fx, hip hematoma, CBC wnl, OK to resume AC per MD.  Will use low end goals and will not bolus.  PTT elevated this AM at 178 seconds No bleedingnoted  Goal of Therapy:  Heparin level 0.3-0.5 units/ml aPTT 66-85 seconds Monitor  platelets by anticoagulation protocol: Yes   Plan:  Hold heparin for 1 hours Decrease heparin to 850 units / hr PTT in 8 hours F/u long term AC plan  Thank you Anette Guarneri, PharmD 12/17/2020 7:45 AM

## 2020-12-17 NOTE — TOC Initial Note (Signed)
Transition of Care [facility]) - Initial/Assessment Note    Patient Details  Name: Kayla Harrison MRN: [id number] Date of Birth: 01/19/1946  Transition of Care [facility]) CM/SW Contact:    Kermit Balo, RN Phone Number: 12/17/2020, 11:57 AM  Clinical Narrative:                 CM met with the patient to go over the recommendations for [facility] services. She is open to [facility] services and has no preference. CM inquired about 24 hour supervision and she states she guesses her sister could stay with her. Pt does have alert system for falls. CM noted that her address is [facility] [address] in an apartment. FH may feel she needs to d/c to their rehab prior to returning to her apartment. CM will updated CSW and follow.    Expected Discharge Plan: Home w Home Health Services Barriers to Discharge: Continued Medical Work up   Patient Goals and CMS Choice   CMS Medicare.gov Compare Post Acute Care list provided to:: Patient Choice offered to / list presented to : Patient  Expected Discharge Plan and Services Expected Discharge Plan: Home w Home Health Services In-house Referral: Clinical Social Work Discharge Planning Services: CM Consult Post Acute Care Choice: Home Health Living arrangements for the past 2 months: Apartment                                      Prior Living Arrangements/Services Living arrangements for the past 2 months: Apartment Lives with:: Self Patient language and need for interpreter reviewed:: Yes Do you feel safe going back to the place where you live?: Yes      Need for Family Participation in Patient Care: Yes (Comment) Care giver support system in place?: No (comment) Current home services: DME (elevated toilet) Criminal Activity/Legal Involvement Pertinent to Current Situation/Hospitalization: No - Comment as needed  Activities of Daily Living Home Assistive Devices/Equipment: Cane (specify quad or straight),Eyeglasses,Walker (specify type) ADL Screening  (condition at time of admission) Patient's cognitive ability adequate to safely complete daily activities?: Yes Is the patient deaf or have difficulty hearing?: No Does the patient have difficulty seeing, even when wearing glasses/contacts?: No Does the patient have difficulty concentrating, remembering, or making decisions?: No Patient able to express need for assistance with ADLs?: Yes Does the patient have difficulty dressing or bathing?: No Independently performs ADLs?: Yes (appropriate for developmental age) Does the patient have difficulty walking or climbing stairs?: Yes Weakness of Legs: Both Weakness of Arms/Hands: Left  Permission Sought/Granted                  Emotional Assessment Appearance:: Appears stated age Attitude/Demeanor/Rapport: Engaged Affect (typically observed): Accepting Orientation: : Oriented to Self,Oriented to Place,Oriented to  Time,Oriented to Situation   Psych Involvement: No (comment)  Admission diagnosis:  Fall [W19.XXXA] Patient Active Problem List   Diagnosis Date Noted  . Orthostatic hypotension 12/17/2020  . History of CVA (cerebrovascular accident) 12/17/2020  . Fall 12/15/2020  . Hip hematoma, left, initial encounter 12/15/2020  . Nondisplaced fracture of coronoid process of left ulna, initial encounter for closed fracture 12/15/2020  . Multiple rib fractures 12/15/2020  . COVID-19 virus infection 10/12/2020  . Constipation 06/22/2020  . Change in bowel habits 06/22/2020  . Diverticulosis 06/22/2020  . Dark stools 06/22/2020  . Hemorrhoids 06/22/2020  . Pacemaker 01/23/2020  . Junctional rhythm 01/23/2020  .  Palpitations 01/23/2020  . ICH (intracerebral hemorrhage) ([place]) 12/02/2019  . A-fib ([place]) 12/02/2019  . Anemia 12/02/2019  . QT prolongation 12/02/2019  . Hypothyroidism 12/02/2019  . Gait abnormality 07/10/2018  . S/P reverse total shoulder arthroplasty, 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  . Persistent atrial fibrillation ([place])   .  Bilateral ureteral calculi 11/17/2017  . Atrial fibrillation with RVR ([place]) 09/15/2017  . Atrial flutter ([place]) 07/17/2017  . Port catheter in place 04/02/2017  . Non-Hodgkin lymphoma, unspecified, intrathoracic lymph nodes ([place]) 02/13/2017  . Mediastinal mass 02/06/2017  . 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 Abnormal chest x-ray   . Lung mass 02/03/2017  . 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  . OSA (obstructive sleep apnea) 02/03/2015  . History of renal calculi 11/16/2014  . Renal calculi 11/16/2014  . Family history of colon cancer 10/26/2014  . Mechanical complication due to cardiac pacemaker pulse generator 11/06/2013  . Dyspnea 05/12/2013  . Hypothyroidism 04/21/2013  . Pleural effusion 04/19/2013  . Long term (current) use of anticoagulants 12/20/2010  . EPIDERMOID CYST 08/22/2010  . Chronic diastolic heart failure ([place]) 08/06/2010  . SYNCOPE AND COLLAPSE 07/27/2010  . OSTEOARTHRITIS, KNEE, RIGHT 03/15/2010  . Morbid obesity ([place]) 12/07/2009  . NONSPEC ELEVATION OF LEVELS OF TRANSAMINASE/LDH 09/08/2009  . BREAST CANCER, HX OF 07/25/2009  . COLONIC POLYPS, HX OF 07/25/2009  . TUBULOVILLOUS ADENOMA, COLON 04/29/2008  . HYPERGLYCEMIA, FASTING 04/29/2008  . HYPERLIPIDEMIA 06/22/2007  . CARCINOMA, THYROID GLAND, HX OF 06/22/2007   PCP:  [name], MD Pharmacy:   [facility], [place] [address] Phone: [phone] Fax: [phone]  [facility], [address], [address] [place], [place] [address] Phone: [phone] Fax: [phone]     Social Determinants of Health (SDOH) Interventions    Readmission Risk Interventions No flowsheet data found.

## 2020-12-17 NOTE — Progress Notes (Signed)
Pt doesn't want the abd binder when in bed. She would like to try it tom when she gets out of bed.

## 2020-12-17 NOTE — Progress Notes (Signed)
Lab called [phone]) this morning.  They need to recollect patient's PTT.

## 2020-12-17 NOTE — Progress Notes (Signed)
Date and time results received: 12/17/20 0730 (use smartphrase ".now" to insert current time)  Test: PTT Critical Value: 178  Name of Provider Notified: Anette Guarneri [facility]  Orders Received? Or Actions Taken?: Orders Received - See Orders for details  Stop heparin drip.

## 2020-12-18 DIAGNOSIS — W19XXXA Unspecified fall, initial encounter: Secondary | ICD-10-CM | POA: Diagnosis not present

## 2020-12-18 DIAGNOSIS — I951 Orthostatic hypotension: Secondary | ICD-10-CM | POA: Diagnosis not present

## 2020-12-18 LAB — CBC
HCT: 29.7 % — ABNORMAL LOW (ref 36.0–46.0)
Hemoglobin: 9.7 g/dL — ABNORMAL LOW (ref 12.0–15.0)
MCH: 30.1 pg (ref 26.0–34.0)
MCHC: 32.7 g/dL (ref 30.0–36.0)
MCV: 92.2 fL (ref 80.0–100.0)
Platelets: 151 10*3/uL (ref 150–400)
RBC: 3.22 MIL/uL — ABNORMAL LOW (ref 3.87–5.11)
RDW: 15 % (ref 11.5–15.5)
WBC: 8.8 10*3/uL (ref 4.0–10.5)
nRBC: 0 % (ref 0.0–0.2)

## 2020-12-18 LAB — APTT: aPTT: 78 seconds — ABNORMAL HIGH (ref 24–36)

## 2020-12-18 LAB — HEPARIN LEVEL (UNFRACTIONATED): Heparin Unfractionated: 0.34 IU/mL (ref 0.30–0.70)

## 2020-12-18 LAB — BASIC METABOLIC PANEL
Anion gap: 5 (ref 5–15)
BUN: 20 mg/dL (ref 8–23)
CO2: 28 mmol/L (ref 22–32)
Calcium: 8.3 mg/dL — ABNORMAL LOW (ref 8.9–10.3)
Chloride: 103 mmol/L (ref 98–111)
Creatinine, Ser: 0.91 mg/dL (ref 0.44–1.00)
GFR, Estimated: >60 mL/min (ref >60)
Glucose, Bld: 109 mg/dL — ABNORMAL HIGH (ref 70–99)
Potassium: 4.4 mmol/L (ref 3.5–5.1)
Sodium: 136 mmol/L (ref 135–145)

## 2020-12-18 MED ORDER — HYDROCODONE-ACETAMINOPHEN 5-325 MG PO TABS
1.0000 | ORAL_TABLET | ORAL | Status: DC | PRN
Start: 2020-12-18 — End: 2020-12-22
  Administered 2020-12-18 – 2020-12-22 (×9): 1 via ORAL
  Filled 2020-12-18 (×9): qty 1

## 2020-12-18 MED ORDER — ENSURE PRE-SURGERY PO LIQD
296.0000 mL | Freq: Once | ORAL | Status: AC
  Administered 2020-12-18: 296 mL via ORAL
  Filled 2020-12-18: qty 296

## 2020-12-18 NOTE — Progress Notes (Signed)
Pt. Has home cpap but missing a piece. Pt. States she will wait til family can bring her the rest of her equipment. RT informed pt. To notify if she would like RT to provide a cpap.

## 2020-12-18 NOTE — Progress Notes (Signed)
Patient Name: Kayla Harrison      SUBJECTIVE: Admitted with a fall fracture of her elbow, ribs.  Also bruised hip Orthostatic vital signs showed profound decrease in blood pressure 110--80 going from lying to sitting.  Unable to stand. Has been mostly in bed since   Past Medical History:  Diagnosis Date  . Anemia   . Arthritis    osteoarthritis - knees and right shoulder  . Blood transfusion without reported diagnosis   . Breast cancer ([place])    Dr Margot Chimes, total thyroidectomy- 1999- for cancer  . 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 Chronic bilateral pleural effusions   . Colon polyp    Dr Earlean Shawl  . Complication of anesthesia    Ketamine produces LSD reaction, bright colored nightmarish experience   . Dyslipidemia   . Endometriosis   . Fibroids   . H/O pleural effusion    s/p thoracentesis w 3293ml withdrawn  . Hepatitis    [place]hepatitis   . History of dysphagia    due to radiation therapy  . History of hiatal hernia    small noted on PET scan  . Hypertension   . Hypothyroidism   . Lung cancer, lower lobe ([place]) 01/2017   radiation RX completed 03/04/17; will start chemo 6/27, pt unaware of lung cancer  . Morbid obesity ([place])    Status post lap band surgery  . Nephrolithiasis   . Non Hodgkin's lymphoma ([place])    on chemotherapy  . Persistent atrial fibrillation ([place])    a. s/p PVI 2008 b. s/p convergent ablation 8119 complicated by bradycardia requiring pacemaker implant  . Personal history of radiation therapy   . Presence of permanent cardiac pacemaker   . Rotator cuff tear    Right  . Stroke [facility])    2003- [place] x2  . SVC syndrome    with lung mass and non hodgkins lymphoma  . Thyroid cancer ([place]) 2000    Scheduled Meds:  Scheduled Meds: . amiodarone  200 mg Oral Daily  . docusate sodium  100 mg Oral BID  . fludrocortisone  0.1 mg Oral Daily  . levothyroxine  137 mcg Oral Once per day on Sat   Or  .  levothyroxine  68.5 mcg Oral Once per day on Sat  . lidocaine  2 patch Transdermal Q24H  . multivitamin with minerals  1 tablet Oral Daily  . rosuvastatin  10 mg Oral Once per day on Mon Thu   Continuous Infusions: . heparin 850 Units/hr (12/17/20 0903)   acetaminophen **OR** acetaminophen, bisacodyl, HYDROcodone-acetaminophen, morphine injection, ondansetron **OR** ondansetron (ZOFRAN) IV, polyethylene glycol, senna    PHYSICAL EXAM Vitals:   12/17/20 0836 12/17/20 1457 12/17/20 2030 12/18/20 0857  BP: (!) 107/53 (!) 104/57 (!) 102/55 (!) 110/32  Pulse: 70 70 70 69  Resp: 17 15 (!) 21 16  Temp: 98.2 F (36.8 C) 97.6 F (36.4 C) 98.6 F (37 C) 98.1 F (36.7 C)  TempSrc: Oral Oral Oral Oral  SpO2: 100% 100% 98% 97%  Weight:      Height:       Well developed and nourished in no acute distress HENT normal Neck supple Clear Regular rate and rhythm, no murmurs or gallops Abd-soft with active BS No Clubbing cyanosis edema swelling of her right forearm with pain just upstream from the IV site Skin-warm and dry A & Oriented  Grossly normal sensory and motor function  Tel  Apacing  Intake/Output Summary (Last 24 hours) at 12/18/2020 0910 Last data filed at 12/17/2020 1700 Gross per 24 hour  Intake 538.84 ml  Output 500 ml  Net 38.84 ml    LABS: Basic Metabolic Panel: Recent Labs  Lab 12/15/20 0559 12/16/20 0251 12/17/20 0122 12/18/20 0756  NA 134* 135 136 136  K 4.7 4.5 4.5 4.4  CL 101 100 103 103  CO2 24 27 28 28   GLUCOSE 104* 94 118* 109*  BUN 21 19 25* 20  CREATININE 0.92 0.99 1.03* 0.91  CALCIUM 8.6* 8.5* 8.3* 8.3*   Cardiac Enzymes: No results for input(s): CKTOTAL, CKMB, CKMBINDEX, TROPONINI in the last 72 hours. CBC: Recent Labs  Lab 12/15/20 0559 12/16/20 0251 12/17/20 0122 12/18/20 0756  WBC 8.3 6.3 8.0 8.8  NEUTROABS 6.2  --   --   --   HGB 13.4 12.1 10.7* 9.7*  HCT 41.5 38.5 32.6* 29.7*  MCV 94.3 94.1 91.3 92.2  PLT 171 173 172 151    PROTIME: No results for input(s): LABPROT, INR in the last 72 hours. Liver Function Tests: No results for input(s): AST, ALT, ALKPHOS, BILITOT, PROT, ALBUMIN in the last 72 hours. No results for input(s): LIPASE, AMYLASE in the last 72 hours. BNP: BNP (last 3 results) No results for input(s): BNP in the last 8760 hours.  ProBNP (last 3 results) No results for input(s): PROBNP in the last 8760 hours.  D-Dimer: No results for input(s): DDIMER in the last 72 hours. Hemoglobin A1C: No results for input(s): HGBA1C in the last 72 hours. Fasting Lipid Panel: No results for input(s): CHOL, HDL, LDLCALC, TRIG, CHOLHDL, LDLDIRECT in the last 72 hours. Thyroid Function Tests: No results for input(s): TSH, T4TOTAL, T3FREE, THYROIDAB in the last 72 hours.  Invalid input(s): FREET3 Anemia Panel: Recent Labs    12/17/20 0122  VITAMINB12 40     Device Interrogation:* pending    ASSESSMENT AND PLAN:  Atrial fibrillation-persistent  Amiodarone therapy-longstanding  Sinus node dysfunction status post pacemaker  Orthostatic hypotension-profound  Falls-recurrent   Patient has profound orthostatic hypotension.  Pharmacological and nonpharmacological therapies will be necessary to address this.  Initially, have started fludrocortisone.  It may be complicated by edema.  Adjunctive pharmacological therapies could include ProAmatine and/or pyridostigmine; however, I would start 1 at a time.  Nonpharmacological therapies include the bed in reverse Trendelenburg, and abdominal binder but will probably need to be a 6 inch or 9 inch binder is not 12 inches, and thigh sleeves which I do not know if we have available in hospital.  Thigh-high support stockings are an alternative; however, most patients there are possible to put on.  The most important thing however is to get her out of bed.  Bed rest is going to be debilitating.  As much time as she can be in a chair is to her benefit.  The  issue of amiodarone needs to be considered as it can be associated with orthostatic hypotension.  It has been long used to maintain sinus rhythm which is also been a problem for her.  Its half-life is as well no very long and so getting in and out of her system anytime soon is not possible.  Having said that, neurological side effects typically resolve more quickly than the amiodarone half-life would predict.  Still, I would continue it for now and see if we can deal with the orthostasis as above.  10   Signed, [name] MD  12/18/2020

## 2020-12-18 NOTE — Plan of Care (Signed)
  Problem: Activity: Goal: Risk for activity intolerance will decrease Outcome: Progressing   Problem: Coping: Goal: Level of anxiety will decrease Outcome: Progressing   Problem: Pain Managment: Goal: General experience of comfort will improve Outcome: Progressing   Problem: Safety: Goal: Ability to remain free from injury will improve Outcome: Progressing   Problem: Skin Integrity: Goal: Risk for impaired skin integrity will decrease Outcome: Progressing   

## 2020-12-18 NOTE — Progress Notes (Signed)
ANTICOAGULATION CONSULT NOTE - Follow Up Consult  Pharmacy Consult for Heparin Indication: atrial fibrillation  Allergies  Allergen Reactions  . Dofetilide Other (See Comments)    Prolonged QT interval   . Ranolazine     Balance issues  . Rivaroxaban Other (See Comments)    Nose Bleed X 6 hrs ; packing in ER Nasal hemorrhage  . Rivaroxaban     Nose Bleed X 6 hrs ; packing in ER Nasal hemorrhage  . Tape Other (See Comments)    SKIN IS THIN AND TEARS EASILY   . Tikosyn [Dofetilide] Other (See Comments)    "Long QT wave"   . Epinephrine Other (See Comments)    Oral anesthetic Dental form only (liquid). Patient stated she will become "out of it" she can hear you but cannot respond in normal fashion. "not fully with it"  . Epinephrine   . Ketamine Other (See Comments)    Hallucinations   . Adhesive [Tape] Rash    Paper tape as well  . Ketamine Other (See Comments)    HALLUCINATIONS    Patient Measurements: Height: 5\' 6"  (167.6 cm) Weight: 92.5 kg (204 lb) IBW/kg (Calculated) : 59.3 Heparin Dosing Weight:    Vital Signs: Temp: 98.1 F (36.7 C) (03/28 0857) Temp Source: Oral (03/28 0857) BP: 115/45 (03/28 0920) Pulse Rate: 69 (03/28 0857)  Labs: Recent Labs    12/16/20 0251 12/16/20 1243 12/17/20 0122 12/17/20 0638 12/17/20 1914 12/18/20 0756  HGB 12.1  --  10.7*  --   --  9.7*  HCT 38.5  --  32.6*  --   --  29.7*  PLT 173  --  172  --   --  151  APTT 29 74*  --  178* 82* 78*  HEPARINUNFRC 0.59 0.69 0.94*  --   --  0.34  CREATININE 0.99  --  1.03*  --   --  0.91    Estimated Creatinine Clearance: 60.3 mL/min (by C-G formula based on SCr of 0.91 mg/dL).   Assessment: Anticoag:  s/p fall, on Eliquis PTA for afib with last dose taken 3/24 @1900 .  CT head negative, elbow/rib fx, large hip hematoma, OK to resume AC per MD.   - Will use low end goals and will not bolus. - aPTT 78 in goal. HL 0.3 also in goal. Hgb 10.7>9.7. Plts stable.  Goal of Therapy:   Heparin level 0.3-0.5 units/ml Monitor platelets by anticoagulation protocol: Yes   Plan:  Con't IV heparin at 850 units/hr Daily HL and CBC   Leonora Gores S. Alford Highland, PharmD, BCPS Clinical Staff Pharmacist [email or web address] Alford Highland, [employer] 12/18/2020,9:56 AM

## 2020-12-18 NOTE — Progress Notes (Signed)
Occupational Therapy Treatment Patient Details Name: Kayla Harrison MRN: [id number] DOB: Sep 22, 1946 Today's Date: 12/18/2020    History of present illness Kayla Harrison is a 75 y.o. female with medical history significant of thyroid cancer/lung cancer/NHL with h/o SVC syndrome; CVA; pacemaker placement; afib on Eliquis; obesity s/p lap band surgery; hypothyroidism; HTN; and HLD presenting with a fall.  Patient has been very unsteady.  Multiple falls over the last several weeks.  Presents with painful hip, and non displaced fxs to L elbow, and  R ribs. Large hematoma over left hip (PA is hopeful for surgery for this tomorrow).   OT comments  This 75 yo female admitted with above presents to acute OT with not making progress today due to her dizziness when up on her feet. Abdominal binder was used but ted hose were not (knee highs nor thigh highs work for her due to her leg structure secondary to lymphedema). Could try ace wraps and/or SCDs when up and about with therapy. Left arm splint is now off which will make it overall easier for her from an ADL standpoint, but with the dizziness she is experiencing when up on her feet and LLE hematoma at hip (surgery hopeful for tomorrow) she is unable to do her LB ADLs without extensive A. Changing recommendation to CIR for follow up.  BP:  Supine 111/39 HR 69 Sitting 127/42  HR 69 Standing unable to get without dizziness impeding Stand pivot to recliner next to bed once seated with legs down 94/41  HR 67 Sitting in recliner with legs up post 5 minutes 106/56 HR 69    Follow Up Recommendations  CIR;Supervision - Intermittent    Equipment Recommendations  3 in 1 bedside commode    Recommendations for Other Services Rehab consult    Precautions / Restrictions Precautions Precautions: Fall Splint/Cast: No longer has long arm gutter splint on LUE Silvestre Gunner, PA-C discontinued it 3/28) Restrictions Weight Bearing Restrictions:  No LUE Weight Bearing: Weight bearing as tolerated       Mobility Bed Mobility Overal bed mobility: Needs Assistance Bed Mobility: Supine to Sit     Supine to sit: Min assist     General bed mobility comments: VCs for sequencing and Min A for up with trunk from flat bed, no rails    Transfers Overall transfer level: Needs assistance Equipment used: None Transfers: Sit to/from [facility] Sit to Stand: Min assist Stand pivot transfers: Min assist       General transfer comment: Pt feeling dizzy with standing not long after standing up (unable to get BP in standing).    Balance Overall balance assessment: Needs assistance Sitting-balance support: No upper extremity supported;Feet supported Sitting balance-Leahy Scale: Good     Standing balance support: Single extremity supported Standing balance-Leahy Scale: Poor                             ADL either performed or assessed with clinical judgement   ADL Overall ADL's : Needs assistance/impaired                         Toilet Transfer: Minimal assistance;Stand-pivot Toilet Transfer Details (indicate cue type and reason): bed>recliner on her left                 Vision Baseline Vision/History: Wears glasses Wears Glasses: Reading only Patient Visual Report: No change from baseline  Cognition Arousal/Alertness: Awake/alert Behavior During Therapy: WFL for tasks assessed/performed Overall Cognitive Status: Within Functional Limits for tasks assessed                                                     Pertinent Vitals/ Pain       Pain Assessment: Faces Pain Score: 4  Pain Location: all over when I move Pain Descriptors / Indicators: Discomfort;Sore Pain Intervention(s): Limited activity within patient's tolerance;Monitored during session;Repositioned         Frequency  Min 2X/week        Progress Toward Goals  OT  Goals(current goals can now be found in the care plan section)  Progress towards OT goals: Not progressing toward goals - comment (due to dizziness when up on her feet)  Acute Rehab OT Goals Patient Stated Goal: to return home, improve strength, stop falling OT Goal Formulation: With patient Time For Goal Achievement: 12/30/20 Potential to Achieve Goals: Good  Plan Discharge plan needs to be updated       AM-PAC OT "6 Clicks" Daily Activity     Outcome Measure   Help from another person eating meals?: None Help from another person taking care of personal grooming?: A Little Help from another person toileting, which includes using toliet, bedpan, or urinal?: A Lot Help from another person bathing (including washing, rinsing, drying)?: A Lot Help from another person to put on and taking off regular upper body clothing?: A Lot Help from another person to put on and taking off regular lower body clothing?: A Lot 6 Click Score: 15    End of Session    OT Visit Diagnosis: Unsteadiness on feet (R26.81);Muscle weakness (generalized) (M62.81);History of falling (Z91.81);Pain;Dizziness and giddiness (R42) Pain - Right/Left:  (all over)   Activity Tolerance  (patient limited by dizziness)   Patient Left in chair;with call bell/phone within reach;with chair alarm set   Nurse Communication Mobility status (BP's, may benefit from SCD's to have on when therapy is working with her, Clare Gandy hose do not work with her leg shapes (due to lymphedema))        Time: 3790-2409 OT Time Calculation (min): 65 min  Charges: OT General Charges $OT Visit: 1 Visit OT Treatments $Self Care/Home Management : 53-67 mins  [place] Circle, OTR/L Acute Rehab Services Pager [phone] Office [phone]      Almon Register 12/18/2020, 12:34 PM

## 2020-12-18 NOTE — Progress Notes (Signed)
Progress Note    Kayla Harrison  ACZ:[id number] DOB: 1945-10-07  DOA: 12/15/2020 PCP: [name], MD    Brief Narrative:     Medical records reviewed and are as summarized below:  Kayla Harrison is an 75 y.o. female with medical history significant of thyroid cancer/lung cancer/NHL with h/o SVC syndrome; CVA; pacemaker placement; afib on Eliquis; obesity s/p lap band surgery; hypothyroidism; HTN; and HLD presenting with a fall.  "It's a complicated story, which is why I wanted to be admitted". She has been very unsteady. Fell 2 weeks ago by the garbage can. [place] again a week later, twisted her foot and fell into the recliner. She has been getting weaker. She went to ortho and was told she sprained her foot; she was given steroids and finished these Wednesday. That AM, she started having severe pain on her B shins. She has lymphedema and has been using a leg pump - ?related. She started getting very weak yesterday and in the afternoon she was having significant trouble walking. She felt like she had no energy and had difficulty getting up with her walker. She had to urinate overnight and fell when she tried to stand, slid into the floor. She got up again about 4AM and she was too weak to walk once again. She fell against the bookcase and damaged her hip, elbow, and ribs. She has had a "wicked headache" for 2 days. She had prior CVA in 2002, but is taking Eliquis. Her inability to think, act yesterday was frustrating yesterday. No dysphagia or dysarthria. B weakness. She has a biochemistry and medical technology background and so "has significant medical knowledge and understands medical problems" better than most people.   Assessment/Plan:   Principal Problem:   Fall Active Problems:   HYPERLIPIDEMIA   Hypothyroidism   Non-Hodgkin lymphoma, unspecified, intrathoracic lymph nodes (HCC)   Persistent atrial fibrillation (HCC)   Pacemaker   Hip hematoma, left,  initial encounter   Nondisplaced fracture of coronoid process of left ulna, initial encounter for closed fracture   Multiple rib fractures   Orthostatic hypotension   History of CVA (cerebrovascular accident)   Falls suspected due to orthostatic hypotension -Patient with recurrent falls -follows with Dr. Caryl Comes and seems to have a h/o orthostatic hypotension although patient describes her falling as "legs giving out" at times and during other times "losing balance" -cards consult: TED hose, florinef, abdominal binder -sleep HOB elevated -OOB  Afib -Hold Eliquis given large hematoma -Will transition to Heparin drip for now and monitor -Continue Amio for rate control -Has pacemaker -cards consult  L hip hematoma -No obvious MSK injury other than hematoma -Will follow on Heparin drip as she is high risk for CVA -trend Hgb-- trending down -MRI hip  L elbow fracture - splint placed -outpatient follow up?  Possible R rib fractures -Xray with lateral R 7/8 rib fractures which were not seen on CT -Lidocaine patch   HLD -Continue Crestor  Hypothyroidism -Normal TSH in 07/2020 -Continue Synthroid at current dose for now  Class 1 obesity -Body mass index is 32.93 kg/m.  -She is s/p lap band  H/o cancer -NHL with large mediastinal mass involving thyroid/lungs -Seen by Dr. Lindi Adie in 12/021 and no longer needs routine imaging -Annual f/u   Family Communication/Anticipated D/C date and plan/Code Status   DVT prophylaxis: heparin gtt Code Status: Full Code.  Disposition Plan: Status is: Inpatient  Remains inpatient appropriate because:Inpatient level of care appropriate due to severity  of illness   Dispo: The patient is from: Home              Anticipated d/c is to: tbd              Patient currently is not medically stable to d/c.   Difficult to place patient No         Medical Consultants:   cards    Subjective:   Says she was not out of bed  yesterday   Objective:    Vitals:   12/17/20 1457 12/17/20 2030 12/18/20 0857 12/18/20 0920  BP: (!) 104/57 (!) 102/55 (!) 110/32 (!) 115/45  Pulse: 70 70 69   Resp: 15 (!) 21 16   Temp: 97.6 F (36.4 C) 98.6 F (37 C) 98.1 F (36.7 C)   TempSrc: Oral Oral Oral   SpO2: 100% 98% 97%   Weight:      Height:        Intake/Output Summary (Last 24 hours) at 12/18/2020 1149 Last data filed at 12/17/2020 1700 Gross per 24 hour  Intake 538.84 ml  Output 500 ml  Net 38.84 ml   Filed Weights   12/15/20 0552 12/15/20 1257  Weight: 94 kg 92.5 kg    Exam:  General: Appearance:    Obese female in no acute distress     Lungs:     respirations unlabored  Heart:    Normal heart rate.   MS:   All extremities are intact. Large bruise on left hip  Neurologic:   Awake, alert, oriented x 3. No apparent focal neurological           defect.     Data Reviewed:   I have personally reviewed following labs and imaging studies:  Labs: Labs show the following:   Basic Metabolic Panel: Recent Labs  Lab 12/15/20 0559 12/16/20 0251 12/17/20 0122 12/18/20 0756  NA 134* 135 136 136  K 4.7 4.5 4.5 4.4  CL 101 100 103 103  CO2 24 27 28 28   GLUCOSE 104* 94 118* 109*  BUN 21 19 25* 20  CREATININE 0.92 0.99 1.03* 0.91  CALCIUM 8.6* 8.5* 8.3* 8.3*   GFR Estimated Creatinine Clearance: 60.3 mL/min (by C-G formula based on SCr of 0.91 mg/dL). Liver Function Tests: No results for input(s): AST, ALT, ALKPHOS, BILITOT, PROT, ALBUMIN in the last 168 hours. No results for input(s): LIPASE, AMYLASE in the last 168 hours. No results for input(s): AMMONIA in the last 168 hours. Coagulation profile No results for input(s): INR, PROTIME in the last 168 hours.  CBC: Recent Labs  Lab 12/15/20 0559 12/16/20 0251 12/17/20 0122 12/18/20 0756  WBC 8.3 6.3 8.0 8.8  NEUTROABS 6.2  --   --   --   HGB 13.4 12.1 10.7* 9.7*  HCT 41.5 38.5 32.6* 29.7*  MCV 94.3 94.1 91.3 92.2  PLT 171 173 172 151    Cardiac Enzymes: No results for input(s): CKTOTAL, CKMB, CKMBINDEX, TROPONINI in the last 168 hours. BNP (last 3 results) No results for input(s): PROBNP in the last 8760 hours. CBG: No results for input(s): GLUCAP in the last 168 hours. D-Dimer: No results for input(s): DDIMER in the last 72 hours. Hgb A1c: No results for input(s): HGBA1C in the last 72 hours. Lipid Profile: No results for input(s): CHOL, HDL, LDLCALC, TRIG, CHOLHDL, LDLDIRECT in the last 72 hours. Thyroid function studies: No results for input(s): TSH, T4TOTAL, T3FREE, THYROIDAB in the last 72 hours.  Invalid input(s):  FREET3 Anemia work up: Recent Labs    12/17/20 0122  VITAMINB12 479   Sepsis Labs: Recent Labs  Lab 12/15/20 0559 12/16/20 0251 12/17/20 0122 12/18/20 0756  WBC 8.3 6.3 8.0 8.8    Microbiology Recent Results (from the past 240 hour(s))  Resp Panel by RT-PCR (Flu A&B, Covid) Nasopharyngeal Swab     Status: None   Collection Time: 12/15/20  8:44 AM   Specimen: Nasopharyngeal Swab; Nasopharyngeal(NP) swabs in vial transport medium  Result Value Ref Range Status   SARS Coronavirus 2 by RT PCR NEGATIVE NEGATIVE Final    Comment: (NOTE) SARS-CoV-2 target nucleic acids are NOT DETECTED.  The SARS-CoV-2 RNA is generally detectable in upper respiratory specimens during the acute phase of infection. The lowest concentration of SARS-CoV-2 viral copies this assay can detect is 138 copies/mL. A negative result does not preclude SARS-Cov-2 infection and should not be used as the sole basis for treatment or other patient management decisions. A negative result may occur with  improper specimen collection/handling, submission of specimen other than nasopharyngeal swab, presence of viral mutation(s) within the areas targeted by this assay, and inadequate number of viral copies(<138 copies/mL). A negative result must be combined with clinical observations, patient history, and  epidemiological information. The expected result is Negative.  Fact Sheet for Patients:  EntrepreneurPulse.com.au  Fact Sheet for Healthcare Providers:  IncredibleEmployment.be  This test is no t yet approved or cleared by the [place] FDA and  has been authorized for detection and/or diagnosis of SARS-CoV-2 by FDA under an Emergency Use Authorization (EUA). This EUA will remain  in effect (meaning this test can be used) for the duration of the COVID-19 declaration under Section 564(b)(1) of the Act, 21 U.S.C.section 360bbb-3(b)(1), unless the authorization is terminated  or revoked sooner.       Influenza A by PCR NEGATIVE NEGATIVE Final   Influenza B by PCR NEGATIVE NEGATIVE Final    Comment: (NOTE) The Xpert Xpress SARS-CoV-2/FLU/RSV plus assay is intended as an aid in the diagnosis of influenza from Nasopharyngeal swab specimens and should not be used as a sole basis for treatment. Nasal washings and aspirates are unacceptable for Xpert Xpress SARS-CoV-2/FLU/RSV testing.  Fact Sheet for Patients: EntrepreneurPulse.com.au  Fact Sheet for Healthcare Providers: IncredibleEmployment.be  This test is not yet approved or cleared by the [place] FDA and has been authorized for detection and/or diagnosis of SARS-CoV-2 by FDA under an Emergency Use Authorization (EUA). This EUA will remain in effect (meaning this test can be used) for the duration of the COVID-19 declaration under Section 564(b)(1) of the Act, 21 U.S.C. section 360bbb-3(b)(1), unless the authorization is terminated or revoked.  Performed at [facility], [place] [address], [place], [place] [address]     Procedures and diagnostic studies:  No results found.  Medications:   . amiodarone  200 mg Oral Daily  . docusate sodium  100 mg Oral BID  . fludrocortisone  0.1 mg Oral Daily  . levothyroxine  137 mcg Oral Once per  day on Sat   Or  . levothyroxine  68.5 mcg Oral Once per day on Sat  . lidocaine  2 patch Transdermal Q24H  . multivitamin with minerals  1 tablet Oral Daily  . rosuvastatin  10 mg Oral Once per day on Mon Thu   Continuous Infusions: . heparin 850 Units/hr (12/17/20 0903)     LOS: 3 days   Geradine Girt  [facility]   How to  contact the [facility] Attending or Consulting provider [place] or covering provider during after hours [place], for this patient?  1. Check the care team in [facility] and look for a) attending/consulting TRH provider listed and b) the [facility] team listed 2. Log into [email or web address] and use [place]'s universal password to access. If you do not have the password, please contact the hospital operator. 3. Locate the [facility] provider you are looking for under [facility] and page to a number that you can be directly reached. 4. If you still have difficulty reaching the provider, please page the [facility] (Director on Call) for the Hospitalists listed on amion for assistance.  12/18/2020, 11:49 AM

## 2020-12-18 NOTE — Evaluation (Signed)
Speech Language Pathology Evaluation Patient Details Name: Kayla Harrison MRN: [id number] DOB: May 24, 1946 Today's Date: 12/18/2020 Time: 1093-2355 SLP Time Calculation (min) (ACUTE ONLY): 26 min  Problem List:  Patient Active Problem List   Diagnosis Date Noted  . Orthostatic hypotension 12/17/2020  . History of CVA (cerebrovascular accident) 12/17/2020  . Fall 12/15/2020  . Hip hematoma, left, initial encounter 12/15/2020  . Nondisplaced fracture of coronoid process of left ulna, initial encounter for closed fracture 12/15/2020  . Multiple rib fractures 12/15/2020  . COVID-19 virus infection 10/12/2020  . Constipation 06/22/2020  . Change in bowel habits 06/22/2020  . Diverticulosis 06/22/2020  . Dark stools 06/22/2020  . Hemorrhoids 06/22/2020  . Pacemaker 01/23/2020  . Junctional rhythm 01/23/2020  . Palpitations 01/23/2020  . ICH (intracerebral hemorrhage) ([place]) 12/02/2019  . A-fib ([place]) 12/02/2019  . Anemia 12/02/2019  . QT prolongation 12/02/2019  . Hypothyroidism 12/02/2019  . Gait abnormality 07/10/2018  . S/P reverse total shoulder arthroplasty, 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  . Persistent atrial fibrillation ([place])   . Bilateral ureteral calculi 11/17/2017  . Atrial fibrillation with RVR ([place]) 09/15/2017  . Atrial flutter ([place]) 07/17/2017  . Port catheter in place 04/02/2017  . Non-Hodgkin lymphoma, unspecified, intrathoracic lymph nodes ([place]) 02/13/2017  . Mediastinal mass 02/06/2017  . 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 Abnormal chest x-ray   . Lung mass 02/03/2017  . 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  . OSA (obstructive sleep apnea) 02/03/2015  . History of renal calculi 11/16/2014  . Renal calculi 11/16/2014  . Family history of colon cancer 10/26/2014  . Mechanical complication due to cardiac pacemaker pulse generator 11/06/2013  . Dyspnea 05/12/2013  . Hypothyroidism 04/21/2013  . Pleural effusion 04/19/2013  . Long term (current) use of  anticoagulants 12/20/2010  . EPIDERMOID CYST 08/22/2010  . Chronic diastolic heart failure ([place]) 08/06/2010  . SYNCOPE AND COLLAPSE 07/27/2010  . OSTEOARTHRITIS, KNEE, RIGHT 03/15/2010  . Morbid obesity ([place]) 12/07/2009  . NONSPEC ELEVATION OF LEVELS OF TRANSAMINASE/LDH 09/08/2009  . BREAST CANCER, HX OF 07/25/2009  . COLONIC POLYPS, HX OF 07/25/2009  . TUBULOVILLOUS ADENOMA, COLON 04/29/2008  . HYPERGLYCEMIA, FASTING 04/29/2008  . HYPERLIPIDEMIA 06/22/2007  . CARCINOMA, THYROID GLAND, HX OF 06/22/2007   Past Medical History:  Past Medical History:  Diagnosis Date  . Anemia   . Arthritis    osteoarthritis - knees and right shoulder  . Blood transfusion without reported diagnosis   . Breast cancer ([place])    Dr Margot Chimes, total thyroidectomy- 1999- for cancer  . 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 Chronic bilateral pleural effusions   . Colon polyp    Dr Earlean Shawl  . Complication of anesthesia    Ketamine produces LSD reaction, bright colored nightmarish experience   . Dyslipidemia   . Endometriosis   . Fibroids   . H/O pleural effusion    s/p thoracentesis w 3258ml withdrawn  . Hepatitis    [place] ?hepatitis   . History of dysphagia    due to radiation therapy  . History of hiatal hernia    small noted on PET scan  . Hypertension   . Hypothyroidism   . Lung cancer, lower lobe ([place]) 01/2017   radiation RX completed 03/04/17; will start chemo 6/27, pt unaware of lung cancer  . Morbid obesity ([place])    Status post lap band surgery  . Nephrolithiasis   . Non Hodgkin's lymphoma ([place])    on chemotherapy  . Persistent atrial  fibrillation ([place])    a. s/p PVI 2008 b. s/p convergent ablation 8841 complicated by bradycardia requiring pacemaker implant  . Personal history of radiation therapy   . Presence of permanent cardiac pacemaker   . Rotator cuff tear    Right  . Stroke [facility])    2003- [place] x2  . SVC syndrome    with lung mass and non hodgkins  lymphoma  . Thyroid cancer ([place]) 2000   Past Surgical History:  Past Surgical History:  Procedure Laterality Date  . ABDOMINAL HYSTERECTOMY  1983  . afib ablation     a. 2008 PVI b. 2014 convergent ablation  . APPENDECTOMY    . BONE MARROW BIOPSY  02/21/2017  . BREAST LUMPECTOMY Left 2010  . bso  1998  . CARDIAC CATHETERIZATION     2015- negative  . CARDIOVERSION  10/09/2012   Procedure: CARDIOVERSION;  Surgeon: Minus Breeding, MD;  Location: [facility];  Service: Cardiovascular;  Laterality: N/A;  . CARDIOVERSION  10/09/2012   Procedure: CARDIOVERSION;  Surgeon: Minus Breeding, MD;  Location: [facility] ENDOSCOPY;  Service: Cardiovascular;  Laterality: N/A;  [name] gave the ok to add pt to the add on , but we must check to find out if the can add pt on at [phone] ( [phone])  . CARDIOVERSION N/A 11/20/2012   Procedure: CARDIOVERSION;  Surgeon: Fay Records, MD;  Location: [place];  Service: Cardiovascular;  Laterality: N/A;  . CARDIOVERSION N/A 07/18/2017   Procedure: CARDIOVERSION;  Surgeon: Thayer Headings, MD;  Location[facility] ENDOSCOPY;  Service: Cardiovascular;  Laterality: N/A;  . CARDIOVERSION N/A 10/03/2017   Procedure: CARDIOVERSION;  Surgeon: Sanda Klein, MD;  Location: [facility] ENDOSCOPY;  Service: Cardiovascular;  Laterality: N/A;  . CARDIOVERSION N/A 01/07/2018   Procedure: CARDIOVERSION;  Surgeon: Thayer Headings, MD;  Location: [facility] ENDOSCOPY;  Service: Cardiovascular;  Laterality: N/A;  . CARDIOVERSION N/A 12/10/2019   Procedure: CARDIOVERSION;  Surgeon: Buford Dresser, MD;  Location: [facility] ENDOSCOPY;  Service: Cardiovascular;  Laterality: N/A;  . CHOLECYSTECTOMY    . COLONOSCOPY W/ POLYPECTOMY     Dr Earlean Shawl  . CYSTOSCOPY N/A 02/06/2015   Procedure: CYSTOSCOPY;  Surgeon: Kathie Rhodes, MD;  Location: [facility] ORS;  Service: Urology;  Laterality: N/A;  . CYSTOSCOPY W/ RETROGRADES Left 11/17/2017   Procedure: CYSTOSCOPY WITH RETROGRADE /PYELOGRAM/;  Surgeon: Kathie Rhodes, MD;  Location: [facility]S;   Service: Urology;  Laterality: Left;  . CYSTOSCOPY WITH RETROGRADE PYELOGRAM, URETEROSCOPY AND STENT PLACEMENT Right 02/06/2015   Procedure: RETROGRADE PYELOGRAM, RIGHT URETEROSCOPY STENT PLACEMENT;  Surgeon: Kathie Rhodes, MD;  Location: [facility];  Service: Urology;  Laterality: Right;  . CYSTOSCOPY WITH RETROGRADE PYELOGRAM, URETEROSCOPY AND STENT PLACEMENT Right 03/07/2017   Procedure: CYSTOSCOPY WITH RIGHT RETROGRADE PYELOGRAM,RIGHT  URETEROSCOPYLASER LITHOTRIPSY  AND STENT PLACEMENT AND STONE BASKETRY;  Surgeon: Kathie Rhodes, MD;  Location: [facility];  Service: Urology;  Laterality: Right;  . EYE SURGERY     cataract surgery  . fatty mass removal  1999   pubic area  . HOLMIUM LASER APPLICATION N/A 6/60/6301   Procedure: HOLMIUM LASER APPLICATION;  Surgeon: Kathie Rhodes, MD;  Location: [facility];  Service: Urology;  Laterality: N/A;  . HOLMIUM LASER APPLICATION Right 02/22/931   Procedure: HOLMIUM LASER APPLICATION;  Surgeon: Kathie Rhodes, MD;  Location: [facility];  Service: Urology;  Laterality: Right;  . HOLMIUM LASER APPLICATION Left 3/55/7322   Procedure: HOLMIUM LASER APPLICATION;  Surgeon: Kathie Rhodes, MD;  Location: [facility];  Service:  Urology;  Laterality: Left;  . IR FLUORO GUIDE PORT INSERTION RIGHT  02/24/2017  . IR NEPHROSTOMY PLACEMENT RIGHT  11/17/2017  . IR PATIENT EVAL TECH 0-60 MINS  03/11/2017  . IR REMOVAL TUN ACCESS W/ PORT W/O FL MOD SED  04/20/2018  . IR US GUIDE VASC ACCESS RIGHT  02/24/2017  . KNEE ARTHROSCOPY     bilateral  . LAPAROSCOPIC GASTRIC BANDING  07/10/2010  . LAPAROSCOPIC GASTRIC BANDING     Laparoscopic adjustable banding APS System with posterior hiatal hernia, 2 suture.  Marland Kitchen LAPAROTOMY     for ruptured ovary and ovarian artery   . NEPHROLITHOTOMY Right 11/17/2017   Procedure: NEPHROLITHOTOMY PERCUTANEOUS;  Surgeon: Kathie Rhodes, MD;  Location: [facility];  Service: Urology;  Laterality: Right;  . PACEMAKER INSERTION  03/10/2013    MDT dual chamber PPM  . POCKET REVISION N/A 12/08/2013   Procedure: POCKET REVISION;  Surgeon: Deboraha Sprang, MD;  Location: [facility] CATH LAB;  Service: Cardiovascular;  Laterality: N/A;  . PORTA CATH INSERTION    . REVERSE SHOULDER ARTHROPLASTY Right 05/14/2018   Procedure: RIGHT REVERSE SHOULDER ARTHROPLASTY;  Surgeon: Tania Ade, MD;  Location: [facility];  Service: Orthopedics;  Laterality: Right;  . REVERSE SHOULDER REPLACEMENT Right 05/14/2018  . RIGHT HEART CATH N/A 07/21/2019   Procedure: RIGHT HEART CATH;  Surgeon: Larey Dresser, MD;  Location: [facility] CV LAB;  Service: Cardiovascular;  Laterality: N/A;  . TEE WITH CARDIOVERSION  09/22/2017  . TEE WITHOUT CARDIOVERSION N/A 10/03/2017   Procedure: TRANSESOPHAGEAL ECHOCARDIOGRAM (TEE);  Surgeon: Sanda Klein, MD;  Location: [facility] ENDOSCOPY;  Service: Cardiovascular;  Laterality: N/A;  . TEE WITHOUT CARDIOVERSION N/A 08/04/2019   Procedure: TRANSESOPHAGEAL ECHOCARDIOGRAM (TEE);  Surgeon: Larey Dresser, MD;  Location: [facility] ENDOSCOPY;  Service: Cardiovascular;  Laterality: N/A;  . TEE WITHOUT CARDIOVERSION N/A 12/10/2019   Procedure: TRANSESOPHAGEAL ECHOCARDIOGRAM (TEE);  Surgeon: Buford Dresser, MD;  Location: [facility] ENDOSCOPY;  Service: Cardiovascular;  Laterality: N/A;  . THYROIDECTOMY  1998   Dr Margot Chimes  . TONSILLECTOMY    . TOTAL KNEE ARTHROPLASTY  04/13/2012   Procedure: TOTAL KNEE ARTHROPLASTY;  Surgeon: Rudean Haskell, MD;  Location: [facility];  Service: Orthopedics;  Laterality: Right;  Marland Kitchen VIDEO BRONCHOSCOPY WITH ENDOBRONCHIAL ULTRASOUND N/A 02/07/2017   Procedure: VIDEO BRONCHOSCOPY WITH ENDOBRONCHIAL ULTRASOUND;  Surgeon: Marshell Garfinkel, MD;  Location: [facility];  Service: Pulmonary;  Laterality: N/A;   HPI:  SHERELLE CASTELLI is a 75 y.o. female with medical history significant of thyroid cancer/lung cancer/NHL with h/o SVC syndrome; CVA; pacemaker placement; afib on Eliquis; obesity s/p lap band surgery; hypothyroidism; HTN;  and HLD presenting with a fall.  Patient has been very unsteady.  Multiple falls over the last several weeks.  Presents with painful hip, and non displaced fxs to L elbow, and  R ribs. Large hematoma over left hip. Referring MD noted "pt's sister has concerns about cognitive impairment."   Assessment / Plan / Recommendation Clinical Impression  At present time, further ST involvement is not warranted. She denied cognitive challenges and stated frustration that her sister told the MD she was having problems thinking. She stated infrequent difficulty "coming up with the correct word but I describe it to my friends and they guess it." Given the SLUMS examination, she scored a 25/30 with 27 being within normal range. Pt recalled 3/5 words and 2/5 with semantic cues. She described her system for managing her meds and showed this SLP her calendar where she documents/organizes  activities/appointments. If pt is presented with increased volume of more complex information, she may have difficulty retaining and executing. No tx warranted at this time.    SLP Assessment  SLP Recommendation/Assessment: Patient does not need any further Speech Lanaguage Pathology Services SLP Visit Diagnosis: Cognitive communication deficit (R41.841)    Follow Up Recommendations  None    Frequency and Duration           SLP Evaluation Cognition  Overall Cognitive Status: Within Functional Limits for tasks assessed Arousal/Alertness: Awake/alert Orientation Level: Oriented X4 Attention: Sustained Sustained Attention: Appears intact Memory: Impaired Memory Impairment: Retrieval deficit Awareness: Appears intact Problem Solving: Appears intact Safety/Judgment: Appears intact       Comprehension  Auditory Comprehension Overall Auditory Comprehension: Appears within functional limits for tasks assessed Visual Recognition/Discrimination Discrimination: Not tested Reading Comprehension Reading Status: Not tested     Expression Expression Primary Mode of Expression: Verbal Verbal Expression Overall Verbal Expression: Appears within functional limits for tasks assessed Pragmatics: No impairment Written Expression Dominant Hand: Right Written Expression: Not tested   Oral / Motor  Oral Motor/Sensory Function Overall Oral Motor/Sensory Function: Within functional limits Motor Speech Overall Motor Speech: Appears within functional limits for tasks assessed   GO                    Houston Siren 12/18/2020, 3:58 PM Orbie Pyo Mattheu Brodersen M.Ed Risk analyst [phone] Office [phone]

## 2020-12-18 NOTE — Consult Note (Signed)
Reason for Consult:Hip hematoma Referring Physician: Princella Ion Time called: 1018 Time at bedside: Kayla Harrison is an 75 y.o. female.  HPI: Kayla Harrison fell multiple times 2/2 weakness last week. She was admitted 3d ago and was noted to have a radial head fx and hip hematoma. Orthopedic surgery was consulted today as she was continuing to have severe pain 2/2 the hematoma. She was on Eliquis for afib and is on heparin here. She lives alone and is RHD.  Past Medical History:  Diagnosis Date  . Anemia   . Arthritis    osteoarthritis - knees and right shoulder  . Blood transfusion without reported diagnosis   . Breast cancer ([place])    Dr Margot Chimes, total thyroidectomy- 1999- for cancer  . 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 Chronic bilateral pleural effusions   . Colon polyp    Dr Earlean Shawl  . Complication of anesthesia    Ketamine produces LSD reaction, bright colored nightmarish experience   . Dyslipidemia   . Endometriosis   . Fibroids   . H/O pleural effusion    s/p thoracentesis w 3221m withdrawn  . Hepatitis    [place]hepatitis   . History of dysphagia    due to radiation therapy  . History of hiatal hernia    small noted on PET scan  . Hypertension   . Hypothyroidism   . Lung cancer, lower lobe ([place] 01[place]   radiation RX completed 03/04/17; will start chemo 6/27, pt unaware of lung cancer  . Morbid obesity ([place]    Status post lap band surgery  . Nephrolithiasis   . Non Hodgkin's lymphoma ([place]    on chemotherapy  . Persistent atrial fibrillation ([place]    a. s/p PVI 2008 b. s/p convergent ablation [id number] by bradycardia requiring pacemaker implant  . Personal history of radiation therapy   . Presence of permanent cardiac pacemaker   . Rotator cuff tear    Right  . Stroke ([facility]    [place]  . SVC syndrome    with lung mass and non hodgkins lymphoma  . Thyroid cancer ([place]    Past Surgical History:  Procedure  Laterality Date  . ABDOMINAL HYSTERECTOMY  1983  . afib ablation     a. 2008 PVI b. 2014 convergent ablation  . APPENDECTOMY    . BONE MARROW BIOPSY  02/21/2017  . BREAST LUMPECTOMY Left 2010  . bso  1998  . CARDIAC CATHETERIZATION     2015- negative  . CARDIOVERSION  10/09/2012   Procedure: CARDIOVERSION;  Surgeon: JMinus Breeding MD;  [place]  Service: Cardiovascular;  Laterality: N/A;  . CARDIOVERSION  10/09/2012   Procedure: CARDIOVERSION;  Surgeon: JMinus Breeding MD;  [facility]  Service: Cardiovascular;  Laterality: N/A;  [name] the ok to add pt to the add on , but we must check to find out if the can add pt on at [phone] ( [phone])  . CARDIOVERSION N/A 11/20/2012   Procedure: CARDIOVERSION;  Surgeon: PFay Records MD;  [facility];  Service: Cardiovascular;  Laterality: N/A;  . CARDIOVERSION N/A 07/18/2017   Procedure: CARDIOVERSION;  Surgeon: NThayer Headings MD;  [facility];  Service: Cardiovascular;  Laterality: N/A;  . CARDIOVERSION N/A 10/03/2017   Procedure: CARDIOVERSION;  Surgeon: CSanda Klein MD;  [facility];  Service: Cardiovascular;  Laterality: N/A;  . CARDIOVERSION N/A 01/07/2018   Procedure: CARDIOVERSION;  Surgeon: Thayer Headings, MD;  Location[facility] ENDOSCOPY;  Service: Cardiovascular;  Laterality: N/A;  . CARDIOVERSION N/A 12/10/2019   Procedure: CARDIOVERSION;  Surgeon: Buford Dresser, MD;  Location: [facility] ENDOSCOPY;  Service: Cardiovascular;  Laterality: N/A;  . CHOLECYSTECTOMY    . COLONOSCOPY W/ POLYPECTOMY     Dr Earlean Shawl  . CYSTOSCOPY N/A 02/06/2015   Procedure: CYSTOSCOPY;  Surgeon: Kathie Rhodes, MD;  [facility];  Service: Urology;  Laterality: N/A;  . CYSTOSCOPY W/ RETROGRADES Left 11/17/2017   Procedure: CYSTOSCOPY WITH RETROGRADE /PYELOGRAM/;  Surgeon: Kathie Rhodes, MD;  [facility];  Service: Urology;  Laterality: Left;  . CYSTOSCOPY WITH RETROGRADE PYELOGRAM, URETEROSCOPY AND STENT  PLACEMENT Right 02/06/2015   Procedure: RETROGRADE PYELOGRAM, RIGHT URETEROSCOPY STENT PLACEMENT;  Surgeon: Kathie Rhodes, MD;  [facility];  Service: Urology;  Laterality: Right;  . CYSTOSCOPY WITH RETROGRADE PYELOGRAM, URETEROSCOPY AND STENT PLACEMENT Right 03/07/2017   Procedure: CYSTOSCOPY WITH RIGHT RETROGRADE PYELOGRAM,RIGHT  URETEROSCOPYLASER LITHOTRIPSY  AND STENT PLACEMENT AND STONE BASKETRY;  Surgeon: Kathie Rhodes, MD;  Location: [place];  Service: Urology;  Laterality: Right;  . EYE SURGERY     cataract surgery  . fatty mass removal  1999   pubic area  . HOLMIUM LASER APPLICATION N/A 9/93/7169   Procedure: HOLMIUM LASER APPLICATION;  Surgeon: Kathie Rhodes, MD;  [facility];  Service: Urology;  Laterality: N/A;  . HOLMIUM LASER APPLICATION Right 6/78/9381   Procedure: HOLMIUM LASER APPLICATION;  Surgeon: Kathie Rhodes, MD;  Location[facility];  Service: Urology;  Laterality: Right;  . HOLMIUM LASER APPLICATION Left 0/17/5102   Procedure: HOLMIUM LASER APPLICATION;  Surgeon: Kathie Rhodes, MD;  [facility];  Service: Urology;  Laterality: Left;  . IR FLUORO GUIDE PORT INSERTION RIGHT  02/24/2017  . IR NEPHROSTOMY PLACEMENT RIGHT  11/17/2017  . IR PATIENT EVAL TECH 0-60 MINS  03/11/2017  . IR REMOVAL TUN ACCESS W/ PORT W/O FL MOD SED  04/20/2018  . IR US GUIDE VASC ACCESS RIGHT  02/24/2017  . KNEE ARTHROSCOPY     bilateral  . LAPAROSCOPIC GASTRIC BANDING  07/10/2010  . LAPAROSCOPIC GASTRIC BANDING     Laparoscopic adjustable banding APS System with posterior hiatal hernia, 2 suture.  Marland Kitchen LAPAROTOMY     for ruptured ovary and ovarian artery   . NEPHROLITHOTOMY Right 11/17/2017   Procedure: NEPHROLITHOTOMY PERCUTANEOUS;  Surgeon: Kathie Rhodes, MD;  [facility];  Service: Urology;  Laterality: Right;  . PACEMAKER INSERTION  03/10/2013   MDT dual chamber PPM  . POCKET REVISION N/A 12/08/2013   Procedure: POCKET REVISION;  Surgeon: Deboraha Sprang, MD;  Location: [facility] CATH LAB;  Service: Cardiovascular;  Laterality: N/A;  . PORTA CATH INSERTION    . REVERSE SHOULDER ARTHROPLASTY Right 05/14/2018   Procedure: RIGHT REVERSE SHOULDER ARTHROPLASTY;  Surgeon: Tania Ade, MD;  Location: [place];  Service: Orthopedics;  Laterality: Right;  . REVERSE SHOULDER REPLACEMENT Right 05/14/2018  . RIGHT HEART CATH N/A 07/21/2019   Procedure: RIGHT HEART CATH;  Surgeon: Larey Dresser, MD;  Location: [place] CV LAB;  Service: Cardiovascular;  Laterality: N/A;  . TEE WITH CARDIOVERSION  09/22/2017  . TEE WITHOUT CARDIOVERSION N/A 10/03/2017   Procedure: TRANSESOPHAGEAL ECHOCARDIOGRAM (TEE);  Surgeon: Sanda Klein, MD;  Location: [facility] ENDOSCOPY;  Service: Cardiovascular;  Laterality: N/A;  . TEE WITHOUT CARDIOVERSION N/A 08/04/2019   Procedure: TRANSESOPHAGEAL ECHOCARDIOGRAM (TEE);  Surgeon: Larey Dresser, MD;  Location: [facility] ENDOSCOPY;  Service: Cardiovascular;  Laterality: N/A;  . TEE WITHOUT CARDIOVERSION N/A 12/10/2019   Procedure: TRANSESOPHAGEAL ECHOCARDIOGRAM (TEE);  Surgeon: Buford Dresser, MD;  Location: [facility] ENDOSCOPY;  Service: Cardiovascular;  Laterality: N/A;  . THYROIDECTOMY  1998   Dr Margot Chimes  . TONSILLECTOMY    . TOTAL KNEE ARTHROPLASTY  04/13/2012   Procedure: TOTAL KNEE ARTHROPLASTY;  Surgeon: Rudean Haskell, MD;  Location: [place];  Service: Orthopedics;  Laterality: Right;  Marland Kitchen VIDEO BRONCHOSCOPY WITH ENDOBRONCHIAL ULTRASOUND N/A 02/07/2017   Procedure: VIDEO BRONCHOSCOPY WITH ENDOBRONCHIAL ULTRASOUND;  Surgeon: Marshell Garfinkel, MD;  Location: [place];  Service: Pulmonary;  Laterality: N/A;    Family History  Problem Relation Age of Onset  . Heart disease Father 25       MI _0   . Colon cancer Father        COLON  . Heart attack Father   . Other Mother        temporal arteritis   . Diabetes Sister   . Diabetes Brother   . Diabetes Paternal Aunt   . Diabetes Paternal Grandmother   . Esophageal cancer Neg  Hx   . Inflammatory bowel disease Neg Hx   . Liver disease Neg Hx   . Pancreatic cancer Neg Hx   . Stomach cancer Neg Hx     Social History:  reports that she has never smoked. She has never used smokeless tobacco. She reports that she does not drink alcohol and does not use drugs.  Allergies:  Allergies  Allergen Reactions  . Dofetilide Other (See Comments)    Prolonged QT interval   . Ranolazine     Balance issues  . Rivaroxaban Other (See Comments)    Nose Bleed X 6 hrs ; packing in ER Nasal hemorrhage  . Rivaroxaban     Nose Bleed X 6 hrs ; packing in ER Nasal hemorrhage  . Tape Other (See Comments)    SKIN IS THIN AND TEARS EASILY   . Tikosyn [Dofetilide] Other (See Comments)    "Long QT wave"   . Epinephrine Other (See Comments)    Oral anesthetic Dental form only (liquid). Patient stated she will become "out of it" she can hear you but cannot respond in normal fashion. "not fully with it"  . Epinephrine   . Ketamine Other (See Comments)    Hallucinations   . Adhesive [Tape] Rash    Paper tape as well  . Ketamine Other (See Comments)    HALLUCINATIONS    Medications: I have reviewed the patient's current medications.  Results for orders placed or performed during the hospital encounter of 12/15/20 (from the past 48 hour(s))  Heparin level (unfractionated)     Status: None   Collection Time: 12/16/20 12:43 PM  Result Value Ref Range   Heparin Unfractionated 0.69 0.30 - 0.70 IU/mL    Comment: (NOTE) If heparin results are below expected values, and patient dosage has  been confirmed, suggest follow up testing of antithrombin III levels. Performed at [facility], [place] [address], [place], [place] [address]   APTT     Status: Abnormal   Collection Time: 12/16/20 12:43 PM  Result Value Ref Range   aPTT 74 (H) 24 - 36 seconds    Comment:        IF BASELINE aPTT IS ELEVATED, SUGGEST PATIENT RISK ASSESSMENT BE USED TO DETERMINE  APPROPRIATE ANTICOAGULANT THERAPY. Performed at [facility], [place] [address], [place],  [address]  Heparin level (unfractionated)     Status: Abnormal   Collection Time: 12/17/20  1:22 AM  Result Value Ref Range   Heparin Unfractionated 0.94 (H) 0.30 - 0.70 IU/mL    Comment: (NOTE) If heparin results are below expected values, and patient dosage has  been confirmed, suggest follow up testing of antithrombin III levels. Performed at [facility], [place] [address], [place], [place] [address]   CBC     Status: Abnormal   Collection Time: 12/17/20  1:22 AM  Result Value Ref Range   WBC 8.0 4.0 - 10.5 K/uL   RBC 3.57 (L) 3.87 - 5.11 MIL/uL   Hemoglobin 10.7 (L) 12.0 - 15.0 g/dL   HCT 32.6 (L) 36.0 - 46.0 %   MCV 91.3 80.0 - 100.0 fL   MCH 30.0 26.0 - 34.0 pg   MCHC 32.8 30.0 - 36.0 g/dL   RDW 15.0 11.5 - 15.5 %   Platelets 172 150 - 400 K/uL   nRBC 0.0 0.0 - 0.2 %    Comment: Performed at [facility], [place] [address], [place], [place] [address]  Vitamin B12     Status: None   Collection Time: 12/17/20  1:22 AM  Result Value Ref Range   Vitamin B-12 479 180 - 914 pg/mL    Comment: (NOTE) This assay is not validated for testing neonatal or myeloproliferative syndrome specimens for Vitamin B12 levels. Performed at [facility], [place] [address], [place], [place] [address]   Basic metabolic panel     Status: Abnormal   Collection Time: 12/17/20  1:22 AM  Result Value Ref Range   Sodium 136 135 - 145 mmol/L   Potassium 4.5 3.5 - 5.1 mmol/L   Chloride 103 98 - 111 mmol/L   CO2 28 22 - 32 mmol/L   Glucose, Bld 118 (H) 70 - 99 mg/dL    Comment: Glucose reference range applies only to samples taken after fasting for at least 8 hours.   BUN 25 (H) 8 - 23 mg/dL   Creatinine, Ser 1.03 (H) 0.44 - 1.00 mg/dL   Calcium 8.3 (L) 8.9 - 10.3 mg/dL   GFR, Estimated 56 (L) >60 mL/min    Comment: (NOTE) Calculated using the CKD-EPI Creatinine Equation (2021)     Anion gap 5 5 - 15    Comment: Performed at [place] [address], [place], [place] [address]  APTT     Status: Abnormal   Collection Time: 12/17/20  6:38 AM  Result Value Ref Range   aPTT 178 (HH) 24 - 36 seconds    Comment:        IF BASELINE aPTT IS ELEVATED, SUGGEST PATIENT RISK ASSESSMENT BE USED TO DETERMINE APPROPRIATE ANTICOAGULANT THERAPY. REPEATED TO VERIFY CRITICAL RESULT CALLED TO, READ BACK BY AND VERIFIED WITH: Cameron Sprang, RN 12/17/2020 9678 [address] Performed at [facility], [place] [address], [place], [place] [address]   APTT     Status: Abnormal   Collection Time: 12/17/20  7:14 PM  Result Value Ref Range   aPTT 82 (H) 24 - 36 seconds    Comment:        IF BASELINE aPTT IS ELEVATED, SUGGEST PATIENT RISK ASSESSMENT BE USED TO DETERMINE APPROPRIATE ANTICOAGULANT THERAPY. Performed at [facility], [place] [address] [place], [place] [address]   Heparin level (unfractionated)     Status: None   Collection Time: 12/18/20  7:56 AM  Result  Value Ref Range   Heparin Unfractionated 0.34 0.30 - 0.70 IU/mL    Comment: (NOTE) If heparin results are below expected values, and patient dosage has  been confirmed, suggest follow up testing of antithrombin III levels. Performed at [facility], [place] [address] [place], [place] [address]   CBC     Status: Abnormal   Collection Time: 12/18/20  7:56 AM  Result Value Ref Range   WBC 8.8 4.0 - 10.5 K/uL   RBC 3.22 (L) 3.87 - 5.11 MIL/uL   Hemoglobin 9.7 (L) 12.0 - 15.0 g/dL   HCT 29.7 (L) 36.0 - 46.0 %   MCV 92.2 80.0 - 100.0 fL   MCH 30.1 26.0 - 34.0 pg   MCHC 32.7 30.0 - 36.0 g/dL   RDW 15.0 11.5 - 15.5 %   Platelets 151 150 - 400 K/uL   nRBC 0.0 0.0 - 0.2 %    Comment: Performed at [facility], [place] [address], [place], [place] [address]  APTT     Status: Abnormal   Collection Time: 12/18/20  7:56 AM  Result Value Ref Range   aPTT 78 (H) 24 - 36 seconds    Comment:        IF  BASELINE aPTT IS ELEVATED, SUGGEST PATIENT RISK ASSESSMENT BE USED TO DETERMINE APPROPRIATE ANTICOAGULANT THERAPY. Performed at [facility], [place] [address], [place], [place] [address]   Basic metabolic panel     Status: Abnormal   Collection Time: 12/18/20  7:56 AM  Result Value Ref Range   Sodium 136 135 - 145 mmol/L   Potassium 4.4 3.5 - 5.1 mmol/L   Chloride 103 98 - 111 mmol/L   CO2 28 22 - 32 mmol/L   Glucose, Bld 109 (H) 70 - 99 mg/dL    Comment: Glucose reference range applies only to samples taken after fasting for at least 8 hours.   BUN 20 8 - 23 mg/dL   Creatinine, Ser 0.91 0.44 - 1.00 mg/dL   Calcium 8.3 (L) 8.9 - 10.3 mg/dL   GFR, Estimated >60 >60 mL/min    Comment: (NOTE) Calculated using the CKD-EPI Creatinine Equation (2021)    Anion gap 5 5 - 15    Comment: Performed at [facility] [address] [place], [place] [address]   *Note: Due to a large number of results and/or encounters for the requested time period, some results have not been displayed. A complete set of results can be found in Results Review.    No results found.  Review of Systems  HENT: Negative for ear discharge, ear pain, hearing loss and tinnitus.   Eyes: Negative for photophobia and pain.  Respiratory: Negative for cough and shortness of breath.   Cardiovascular: Negative for chest pain.  Gastrointestinal: Negative for abdominal pain, nausea and vomiting.  Genitourinary: Negative for dysuria, flank pain, frequency and urgency.  Musculoskeletal: Positive for arthralgias (Left hip, elbow, right FA). Negative for back pain, myalgias and neck pain.  Neurological: Negative for dizziness and headaches.  Hematological: Does not bruise/bleed easily.  Psychiatric/Behavioral: The patient is not nervous/anxious.    Blood pressure (!) 115/45, pulse 69, temperature 98.1 F (36.7 C), temperature source Oral, resp. rate 16, height _0  (1.676 m), weight 92.5 kg, SpO2 97 %. Physical  Exam Constitutional:      General: She is not in acute distress.    Appearance: She is well-developed. She is not diaphoretic.  HENT:  Head: Normocephalic and atraumatic.  Eyes:     General: No scleral icterus.       Right eye: No discharge.        Left eye: No discharge.     Conjunctiva/sclera: Conjunctivae normal.  Cardiovascular:     Rate and Rhythm: Normal rate and regular rhythm.  Pulmonary:     Effort: Pulmonary effort is normal. No respiratory distress.  Musculoskeletal:     Cervical back: Normal range of motion.     Comments: Left shoulder, elbow, wrist, digits- no skin wounds, elbow splint in place, nontender, no instability, no blocks to motion  Sens  Ax/R/M/U intact  Mot   Ax/ R/ PIN/ M/ AIN/ U intact  Rad 2+  LLE No traumatic wounds or rash  Large hip hematoma, mod TTP, no skin breakdown  No knee or ankle effusion  Knee stable to varus/ valgus and anterior/posterior stress  Sens DPN, SPN, TN intact  Motor EHL, ext, flex, evers 5/5  DP 2+, PT 0, No significant edema  Skin:    General: Skin is warm and dry.  Neurological:     Mental Status: She is alert.  Psychiatric:        Behavior: Behavior normal.     Assessment/Plan: Left radial head fx -- Can WBAT, motion encouraged, will remove splint Left hip hematoma -- I don't think wound issues can be avoided at this point. However, may be able to help pain with I&D, suspect will need VAC placement and plastic surgery involvement. Will get MRI to r/o femoral neck fx. Multiple medical problems including thyroid cancer/lung cancer/NHL with h/o SVC syndrome; CVA; pacemaker placement; afib on Eliquis; obesity s/p lap band surgery; hypothyroidism; HTN; and HLD -- per primary service    Lisette Abu, PA-C Orthopedic Surgery [phone] 12/18/2020, 11:10 AM

## 2020-12-19 ENCOUNTER — Encounter (HOSPITAL_COMMUNITY): Payer: Self-pay | Admitting: Internal Medicine

## 2020-12-19 ENCOUNTER — Inpatient Hospital Stay (HOSPITAL_COMMUNITY): Payer: Medicare Other

## 2020-12-19 ENCOUNTER — Inpatient Hospital Stay (HOSPITAL_COMMUNITY): Payer: Medicare Other | Admitting: Certified Registered Nurse Anesthetist

## 2020-12-19 ENCOUNTER — Encounter (HOSPITAL_COMMUNITY)
Admission: EM | Disposition: A | Discharge status: Rehabilitation Facility | Payer: Self-pay | Source: Home / Self Care | Attending: Internal Medicine

## 2020-12-19 DIAGNOSIS — I951 Orthostatic hypotension: Secondary | ICD-10-CM | POA: Diagnosis not present

## 2020-12-19 DIAGNOSIS — Z8673 Personal history of transient ischemic attack (TIA), and cerebral infarction without residual deficits: Secondary | ICD-10-CM | POA: Diagnosis not present

## 2020-12-19 DIAGNOSIS — I4819 Other persistent atrial fibrillation: Secondary | ICD-10-CM | POA: Diagnosis not present

## 2020-12-19 HISTORY — PX: I & D EXTREMITY: SHX5045

## 2020-12-19 LAB — TSH: TSH: 1.587 u[IU]/mL (ref 0.350–4.500)

## 2020-12-19 LAB — CBC
HCT: 30.1 % — ABNORMAL LOW (ref 36.0–46.0)
Hemoglobin: 9.5 g/dL — ABNORMAL LOW (ref 12.0–15.0)
MCH: 29.9 pg (ref 26.0–34.0)
MCHC: 31.6 g/dL (ref 30.0–36.0)
MCV: 94.7 fL (ref 80.0–100.0)
Platelets: 146 10*3/uL — ABNORMAL LOW (ref 150–400)
RBC: 3.18 MIL/uL — ABNORMAL LOW (ref 3.87–5.11)
RDW: 15 % (ref 11.5–15.5)
WBC: 8.8 10*3/uL (ref 4.0–10.5)
nRBC: 0 % (ref 0.0–0.2)

## 2020-12-19 LAB — SURGICAL PCR SCREEN
MRSA, PCR: NEGATIVE
Staphylococcus aureus: NEGATIVE

## 2020-12-19 LAB — HEPARIN LEVEL (UNFRACTIONATED): Heparin Unfractionated: 0.26 IU/mL — ABNORMAL LOW (ref 0.30–0.70)

## 2020-12-19 SURGERY — IRRIGATION AND DEBRIDEMENT EXTREMITY
Anesthesia: Choice | Laterality: Left

## 2020-12-19 SURGERY — IRRIGATION AND DEBRIDEMENT EXTREMITY
Anesthesia: General | Site: Hip | Laterality: Left

## 2020-12-19 MED ORDER — MUPIROCIN 2 % EX OINT
1.0000 "application " | TOPICAL_OINTMENT | Freq: Two times a day (BID) | CUTANEOUS | Status: DC
  Administered 2020-12-20 – 2020-12-21 (×3): 1 via NASAL
  Filled 2020-12-19: qty 22

## 2020-12-19 MED ORDER — CHLORHEXIDINE GLUCONATE 4 % EX LIQD: 60.0000 mL | Freq: Once | CUTANEOUS | Status: DC

## 2020-12-19 MED ORDER — FENTANYL CITRATE (PF) 100 MCG/2ML IJ SOLN
25.0000 ug | INTRAMUSCULAR | Status: DC | PRN
Start: 2020-12-19 — End: 2020-12-19
  Administered 2020-12-19 (×3): 50 ug via INTRAVENOUS

## 2020-12-19 MED ORDER — OXYCODONE HCL 5 MG PO TABS: 5.0000 mg | ORAL_TABLET | Freq: Once | ORAL | Status: DC | PRN

## 2020-12-19 MED ORDER — SODIUM CHLORIDE 0.9 % IR SOLN
Status: DC | PRN
  Administered 2020-12-19: 3000 mL

## 2020-12-19 MED ORDER — PROPOFOL 10 MG/ML IV BOLUS
INTRAVENOUS | Status: DC | PRN
  Administered 2020-12-19: 110 mg via INTRAVENOUS

## 2020-12-19 MED ORDER — ORAL CARE MOUTH RINSE: 15.0000 mL | Freq: Once | OROMUCOSAL | Status: AC

## 2020-12-19 MED ORDER — LACTATED RINGERS IV SOLN: INTRAVENOUS | Status: DC

## 2020-12-19 MED ORDER — ACETAMINOPHEN 10 MG/ML IV SOLN: 1000.0000 mg | Freq: Once | INTRAVENOUS | Status: DC | PRN

## 2020-12-19 MED ORDER — CEFAZOLIN SODIUM-DEXTROSE 2-4 GM/100ML-% IV SOLN
2.0000 g | INTRAVENOUS | Status: AC
  Administered 2020-12-19: 2 g via INTRAVENOUS
  Filled 2020-12-19: qty 100

## 2020-12-19 MED ORDER — OXYCODONE HCL 5 MG/5ML PO SOLN: 5.0000 mg | Freq: Once | ORAL | Status: DC | PRN

## 2020-12-19 MED ORDER — FENTANYL CITRATE (PF) 250 MCG/5ML IJ SOLN
INTRAMUSCULAR | Status: DC | PRN
  Administered 2020-12-19: 25 ug via INTRAVENOUS
  Administered 2020-12-19: 50 ug via INTRAVENOUS
  Administered 2020-12-19: 25 ug via INTRAVENOUS

## 2020-12-19 MED ORDER — POVIDONE-IODINE 10 % EX SWAB
2.0000 "application " | Freq: Once | CUTANEOUS | Status: AC
  Administered 2020-12-19: 2 via TOPICAL

## 2020-12-19 MED ORDER — FENTANYL CITRATE (PF) 100 MCG/2ML IJ SOLN
INTRAMUSCULAR | Status: AC
  Filled 2020-12-19: qty 2

## 2020-12-19 MED ORDER — PHENYLEPHRINE 40 MCG/ML (10ML) SYRINGE FOR IV PUSH (FOR BLOOD PRESSURE SUPPORT)
PREFILLED_SYRINGE | INTRAVENOUS | Status: DC | PRN
  Administered 2020-12-19 (×3): 120 ug via INTRAVENOUS
  Administered 2020-12-19: 80 ug via INTRAVENOUS

## 2020-12-19 MED ORDER — LEVOTHYROXINE SODIUM 25 MCG PO TABS: 68.5000 ug | ORAL_TABLET | ORAL | Status: DC

## 2020-12-19 MED ORDER — CEFAZOLIN SODIUM-DEXTROSE 1-4 GM/50ML-% IV SOLN
1.0000 g | Freq: Three times a day (TID) | INTRAVENOUS | Status: AC
  Administered 2020-12-20 (×3): 1 g via INTRAVENOUS
  Filled 2020-12-19 (×3): qty 50

## 2020-12-19 MED ORDER — DEXAMETHASONE SODIUM PHOSPHATE 10 MG/ML IJ SOLN
INTRAMUSCULAR | Status: DC | PRN
  Administered 2020-12-19: 5 mg via INTRAVENOUS

## 2020-12-19 MED ORDER — EPHEDRINE SULFATE-NACL 50-0.9 MG/10ML-% IV SOSY
PREFILLED_SYRINGE | INTRAVENOUS | Status: DC | PRN
  Administered 2020-12-19: 5 mg via INTRAVENOUS

## 2020-12-19 MED ORDER — 0.9 % SODIUM CHLORIDE (POUR BTL) OPTIME
TOPICAL | Status: DC | PRN
  Administered 2020-12-19: 1000 mL

## 2020-12-19 MED ORDER — CHLORHEXIDINE GLUCONATE 0.12 % MT SOLN: 15.0000 mL | Freq: Once | OROMUCOSAL | Status: AC

## 2020-12-19 MED ORDER — LIDOCAINE 2% (20 MG/ML) 5 ML SYRINGE
INTRAMUSCULAR | Status: DC | PRN
  Administered 2020-12-19: 60 mg via INTRAVENOUS

## 2020-12-19 MED ORDER — ONDANSETRON HCL 4 MG/2ML IJ SOLN
INTRAMUSCULAR | Status: DC | PRN
  Administered 2020-12-19: 4 mg via INTRAVENOUS

## 2020-12-19 MED ORDER — CHLORHEXIDINE GLUCONATE 0.12 % MT SOLN
OROMUCOSAL | Status: AC
  Administered 2020-12-19: 15 mL
  Filled 2020-12-19: qty 15

## 2020-12-19 MED ORDER — FENTANYL CITRATE (PF) 250 MCG/5ML IJ SOLN
INTRAMUSCULAR | Status: AC
  Filled 2020-12-19: qty 5

## 2020-12-19 MED ORDER — LEVOTHYROXINE SODIUM 25 MCG PO TABS
137.0000 ug | ORAL_TABLET | ORAL | Status: DC
  Administered 2020-12-19 – 2020-12-22 (×4): 137 ug via ORAL
  Filled 2020-12-19 (×4): qty 1

## 2020-12-19 SURGICAL SUPPLY — 48 items
BANDAGE ESMARK 6X9 LF (GAUZE/BANDAGES/DRESSINGS) IMPLANT
BNDG CMPR 9X4 STRL LF SNTH (GAUZE/BANDAGES/DRESSINGS)
BNDG CMPR 9X6 STRL LF SNTH (GAUZE/BANDAGES/DRESSINGS)
BNDG CMPR MED 10X6 ELC LF (GAUZE/BANDAGES/DRESSINGS)
BNDG COHESIVE 4X5 TAN STRL (GAUZE/BANDAGES/DRESSINGS) IMPLANT
BNDG ELASTIC 6X10 VLCR STRL LF (GAUZE/BANDAGES/DRESSINGS) IMPLANT
BNDG ESMARK 4X9 LF (GAUZE/BANDAGES/DRESSINGS) IMPLANT
BNDG ESMARK 6X9 LF (GAUZE/BANDAGES/DRESSINGS)
BNDG GAUZE ELAST 4 BULKY (GAUZE/BANDAGES/DRESSINGS) IMPLANT
CANISTER WOUNDNEG PRESSURE 500 (CANNISTER) ×2 IMPLANT
COVER SURGICAL LIGHT HANDLE (MISCELLANEOUS) ×2 IMPLANT
COVER WAND RF STERILE (DRAPES) IMPLANT
CUFF TOURN SGL QUICK 34 (TOURNIQUET CUFF)
CUFF TRNQT CYL 34X4.125X (TOURNIQUET CUFF) IMPLANT
DRAPE U-SHAPE 47X51 STRL (DRAPES) ×2 IMPLANT
DRSG PAD ABDOMINAL 8X10 ST (GAUZE/BANDAGES/DRESSINGS) IMPLANT
DRSG VAC ATS SM SENSATRAC (GAUZE/BANDAGES/DRESSINGS) ×2 IMPLANT
DRSG XEROFORM 1X8 (GAUZE/BANDAGES/DRESSINGS) IMPLANT
DURAPREP 26ML APPLICATOR (WOUND CARE) ×2 IMPLANT
ELECT REM PT RETURN 9FT ADLT (ELECTROSURGICAL) ×2
ELECTRODE REM PT RTRN 9FT ADLT (ELECTROSURGICAL) ×1 IMPLANT
GAUZE SPONGE 4X4 12PLY STRL (GAUZE/BANDAGES/DRESSINGS) ×2 IMPLANT
GAUZE SPONGE 4X4 12PLY STRL LF (GAUZE/BANDAGES/DRESSINGS) IMPLANT
GAUZE XEROFORM 1X8 LF (GAUZE/BANDAGES/DRESSINGS) IMPLANT
GLOVE BIOGEL M STRL SZ7.5 (GLOVE) ×2 IMPLANT
GLOVE INDICATOR 8.0 STRL GRN (GLOVE) ×2 IMPLANT
GOWN STRL REUS W/ TWL LRG LVL3 (GOWN DISPOSABLE) ×1 IMPLANT
GOWN STRL REUS W/ TWL XL LVL3 (GOWN DISPOSABLE) ×1 IMPLANT
GOWN STRL REUS W/TWL LRG LVL3 (GOWN DISPOSABLE) ×2
GOWN STRL REUS W/TWL XL LVL3 (GOWN DISPOSABLE) ×2
HANDPIECE INTERPULSE COAX TIP (DISPOSABLE)
KIT BASIN OR (CUSTOM PROCEDURE TRAY) ×2 IMPLANT
KIT TURNOVER KIT B (KITS) ×2 IMPLANT
MANIFOLD NEPTUNE II (INSTRUMENTS) ×2 IMPLANT
NEEDLE 22X1 1/2 (OR ONLY) (NEEDLE) IMPLANT
NS IRRIG 1000ML POUR BTL (IV SOLUTION) ×2 IMPLANT
PACK ORTHO EXTREMITY (CUSTOM PROCEDURE TRAY) ×2 IMPLANT
PAD ARMBOARD 7.5X6 YLW CONV (MISCELLANEOUS) ×4 IMPLANT
PAD CAST 4YDX4 CTTN HI CHSV (CAST SUPPLIES) IMPLANT
PADDING CAST COTTON 4X4 STRL (CAST SUPPLIES)
SET CYSTO W/LG BORE CLAMP LF (SET/KITS/TRAYS/PACK) ×2 IMPLANT
SET HNDPC FAN SPRY TIP SCT (DISPOSABLE) IMPLANT
SPONGE LAP 18X18 RF (DISPOSABLE) IMPLANT
STOCKINETTE IMPERVIOUS 9X36 MD (GAUZE/BANDAGES/DRESSINGS) IMPLANT
SUT ETHILON 2 0 FS 18 (SUTURE) IMPLANT
TUBE CONNECTING 12X1/4 (SUCTIONS) ×2 IMPLANT
UNDERPAD 30X36 HEAVY ABSORB (UNDERPADS AND DIAPERS) IMPLANT
YANKAUER SUCT BULB TIP NO VENT (SUCTIONS) ×2 IMPLANT

## 2020-12-19 NOTE — Plan of Care (Signed)

## 2020-12-19 NOTE — Anesthesia Preprocedure Evaluation (Addendum)
Anesthesia Evaluation  Patient identified by MRN, date of birth, ID band Patient awake    Reviewed: Allergy & Precautions, NPO status , Patient's Chart, lab work & pertinent test results  Airway Mallampati: II  TM Distance: >3 FB Neck ROM: Full    Dental  (+) Teeth Intact   Pulmonary sleep apnea ,  Lung Ca    Pulmonary exam normal        Cardiovascular hypertension, + dysrhythmias (on Eliquis ) Atrial Fibrillation + pacemaker  Rhythm:Regular Rate:Normal  SVC  Syndrome    Neuro/Psych CVA (2002), Residual Symptoms    GI/Hepatic Neg liver ROS, hiatal hernia,   Endo/Other  Hypothyroidism Thyroid Ca s/p total thyroidectomy 1999  Renal/GU negative Renal ROS     Musculoskeletal  (+) Arthritis , S/p fall 12/15/20 with left hip hematoma    Abdominal (+)  Abdomen: soft. Bowel sounds: normal.  Peds  Hematology  (+) Blood dyscrasia, anemia , NHL   Anesthesia Other Findings   Reproductive/Obstetrics                            Anesthesia Physical Anesthesia Plan  ASA: III  Anesthesia Plan: General   Post-op Pain Management:    Induction: Intravenous  PONV Risk Score and Plan: 3 and Ondansetron and Treatment may vary due to age or medical condition  Airway Management Planned: Mask and Oral ETT  Additional Equipment: None  Intra-op Plan:   Post-operative Plan: Extubation in OR  Informed Consent: I have reviewed the patients History and Physical, chart, labs and discussed the procedure including the risks, benefits and alternatives for the proposed anesthesia with the patient or authorized representative who has indicated his/her understanding and acceptance.     Dental advisory given  Plan Discussed with: CRNA  Anesthesia Plan Comments: (- patient arrived to short stay on heparin gtt for high CVA risk and off Eliquis post fall. Spoke with Dr. Lucia Gaskins, heparin discontinued 1630.   Lab  Results      Component                Value               Date                      WBC                      8.8                 12/19/2020                HGB                      9.5 (L)             12/19/2020                HCT                      30.1 (L)            12/19/2020                MCV                      94.7  12/19/2020                PLT                      146 (L)             12/19/2020           Lab Results      Component                Value               Date                      NA                       136                 12/18/2020                K                        4.4                 12/18/2020                CO2                      28                  12/18/2020                GLUCOSE                  109 (H)             12/18/2020                BUN                      20                  12/18/2020                CREATININE               0.91                12/18/2020                CALCIUM                  8.3 (L)             12/18/2020                GFRNONAA                 >60                 12/18/2020                GFRAA                    53 (L)              08/16/2020          )      Anesthesia Quick Evaluation

## 2020-12-19 NOTE — Anesthesia Procedure Notes (Signed)
Procedure Name: LMA Insertion Date/Time: 12/19/2020 5:33 PM Performed by: Dorthea Cove, CRNA Pre-anesthesia Checklist: Patient identified, Emergency Drugs available, Suction available and Patient being monitored Patient Re-evaluated:Patient Re-evaluated prior to induction Oxygen Delivery Method: Circle System Utilized Preoxygenation: Pre-oxygenation with 100% oxygen Induction Type: IV induction Ventilation: Mask ventilation without difficulty LMA: LMA inserted LMA Size: 4.0 Number of attempts: 1 Airway Equipment and Method: Bite block Placement Confirmation: positive ETCO2 Tube secured with: Tape Dental Injury: Teeth and Oropharynx as per pre-operative assessment

## 2020-12-19 NOTE — Consult Note (Signed)
Reason for Consult: Left lateral hip hematoma with MRI evidence of loculated hematoma and potential skin compromise Referring Physician:   [name] is an 74 y.o. female.  HPI: Patient had a fall proximally 5 days ago onto her left hip.  She had pain.  She was seen in the emergency department and was asked to be admitted due to frequent falls recently.  She also had evidence of elbow fracture of the radial head and coronoid process that was minimally displaced.  MRI scan was performed due to concern for occult femoral neck fracture and this was negative.  CT and MRI demonstrated large area of hematoma to the lateral soft tissues.  Patient is obese and takes Eliquis.  She has tension of the skin therefore was indicated for drainage of the hematoma and wound VAC placement.  She has pain in the left hip.  Severe.  Sharp and achy in quality.  She has pain with range of motion of the hip laterally.  Past Medical History:  Diagnosis Date  . Anemia   . Arthritis    osteoarthritis - knees and right shoulder  . Blood transfusion without reported diagnosis   . Breast cancer ([place])    Dr Margot Chimes, total thyroidectomy- 1999- for cancer  . 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 Chronic bilateral pleural effusions   . Colon polyp    Dr Earlean Shawl  . Complication of anesthesia    Ketamine produces LSD reaction, bright colored nightmarish experience   . Dyslipidemia   . Endometriosis   . Fibroids   . H/O pleural effusion    s/p thoracentesis w 3264ml withdrawn  . Hepatitis    [place]hepatitis   . History of dysphagia    due to radiation therapy  . History of hiatal hernia    small noted on PET scan  . Hypertension   . Hypothyroidism   . Lung cancer, lower lobe ([place]) 01/2017   radiation RX completed 03/04/17; will start chemo 6/27, pt unaware of lung cancer  . Morbid obesity ([place])    Status post lap band surgery  . Nephrolithiasis   . Non Hodgkin's lymphoma ([place])    on  chemotherapy  . Persistent atrial fibrillation ([place])    a. s/p PVI 2008 b. s/p convergent ablation 5852 complicated by bradycardia requiring pacemaker implant  . Personal history of radiation therapy   . Presence of permanent cardiac pacemaker   . Rotator cuff tear    Right  . Stroke [facility])    2003- [place] x2  . SVC syndrome    with lung mass and non hodgkins lymphoma  . Thyroid cancer ([place]) 2000    Past Surgical History:  Procedure Laterality Date  . ABDOMINAL HYSTERECTOMY  1983  . afib ablation     a. 2008 PVI b. 2014 convergent ablation  . APPENDECTOMY    . BONE MARROW BIOPSY  02/21/2017  . BREAST LUMPECTOMY Left 2010  . bso  1998  . CARDIAC CATHETERIZATION     2015- negative  . CARDIOVERSION  10/09/2012   Procedure: CARDIOVERSION;  Surgeon: Minus Breeding, MD;  Location: [place];  Service: Cardiovascular;  Laterality: N/A;  . CARDIOVERSION  10/09/2012   Procedure: CARDIOVERSION;  Surgeon: Minus Breeding, MD;  Location: [facility] ENDOSCOPY;  Service: Cardiovascular;  Laterality: N/A;  [name] gave the ok to add pt to the add on , but we must check to find out if the can add pt on at [phone] (  [phone]  . CARDIOVERSION N/A 11/20/2012   Procedure: CARDIOVERSION;  Surgeon: Fay Records, MD;  Location: [place];  Service: Cardiovascular;  Laterality: N/A;  . CARDIOVERSION N/A 07/18/2017   Procedure: CARDIOVERSION;  Surgeon: Thayer Headings, MD;  Location: [facility] ENDOSCOPY;  Service: Cardiovascular;  Laterality: N/A;  . CARDIOVERSION N/A 10/03/2017   Procedure: CARDIOVERSION;  Surgeon: Sanda Klein, MD;  Location: [facility] ENDOSCOPY;  Service: Cardiovascular;  Laterality: N/A;  . CARDIOVERSION N/A 01/07/2018   Procedure: CARDIOVERSION;  Surgeon: Thayer Headings, MD;  Location: [facility] ENDOSCOPY;  Service: Cardiovascular;  Laterality: N/A;  . CARDIOVERSION N/A 12/10/2019   Procedure: CARDIOVERSION;  Surgeon: Buford Dresser, MD;  Location: [facility] ENDOSCOPY;  Service: Cardiovascular;  Laterality: N/A;  .  CHOLECYSTECTOMY    . COLONOSCOPY W/ POLYPECTOMY     Dr Earlean Shawl  . CYSTOSCOPY N/A 02/06/2015   Procedure: CYSTOSCOPY;  Surgeon: Kathie Rhodes, MD;  Location: [facility] ORS;  Service: Urology;  Laterality: N/A;  . CYSTOSCOPY W/ RETROGRADES Left 11/17/2017   Procedure: CYSTOSCOPY WITH RETROGRADE /PYELOGRAM/;  Surgeon: Kathie Rhodes, MD;  Location: [facility] ORS;  Service: Urology;  Laterality: Left;  . CYSTOSCOPY WITH RETROGRADE PYELOGRAM, URETEROSCOPY AND STENT PLACEMENT Right 02/06/2015   Procedure: RETROGRADE PYELOGRAM, RIGHT URETEROSCOPY STENT PLACEMENT;  Surgeon: Kathie Rhodes, MD;  Location: [facility] ORS;  Service: Urology;  Laterality: Right;  . CYSTOSCOPY WITH RETROGRADE PYELOGRAM, URETEROSCOPY AND STENT PLACEMENT Right 03/07/2017   Procedure: CYSTOSCOPY WITH RIGHT RETROGRADE PYELOGRAM,RIGHT  URETEROSCOPYLASER LITHOTRIPSY  AND STENT PLACEMENT AND STONE BASKETRY;  Surgeon: Kathie Rhodes, MD;  Location: [place];  Service: Urology;  Laterality: Right;  . EYE SURGERY     cataract surgery  . fatty mass removal  1999   pubic area  . HOLMIUM LASER APPLICATION N/A 0/05/3817   Procedure: HOLMIUM LASER APPLICATION;  Surgeon: Kathie Rhodes, MD;  Location: [facility];  Service: Urology;  Laterality: N/A;  . HOLMIUM LASER APPLICATION Right 2/99/3716   Procedure: HOLMIUM LASER APPLICATION;  Surgeon: Kathie Rhodes, MD;  Location: [facility];  Service: Urology;  Laterality: Right;  . HOLMIUM LASER APPLICATION Left 9/67/8938   Procedure: HOLMIUM LASER APPLICATION;  Surgeon: Kathie Rhodes, MD;  Location: [facility] ORS;  Service: Urology;  Laterality: Left;  . IR FLUORO GUIDE PORT INSERTION RIGHT  02/24/2017  . IR NEPHROSTOMY PLACEMENT RIGHT  11/17/2017  . IR PATIENT EVAL TECH 0-60 MINS  03/11/2017  . IR REMOVAL TUN ACCESS W/ PORT W/O FL MOD SED  04/20/2018  . IR US GUIDE VASC ACCESS RIGHT  02/24/2017  . KNEE ARTHROSCOPY     bilateral  . LAPAROSCOPIC GASTRIC BANDING  07/10/2010  . LAPAROSCOPIC GASTRIC BANDING      Laparoscopic adjustable banding APS System with posterior hiatal hernia, 2 suture.  Marland Kitchen LAPAROTOMY     for ruptured ovary and ovarian artery   . NEPHROLITHOTOMY Right 11/17/2017   Procedure: NEPHROLITHOTOMY PERCUTANEOUS;  Surgeon: Kathie Rhodes, MD;  Location: [facility]S;  Service: Urology;  Laterality: Right;  . PACEMAKER INSERTION  03/10/2013   MDT dual chamber PPM  . POCKET REVISION N/A 12/08/2013   Procedure: POCKET REVISION;  Surgeon: Deboraha Sprang, MD;  Location: [facility] CATH LAB;  Service: Cardiovascular;  Laterality: N/A;  . PORTA CATH INSERTION    . REVERSE SHOULDER ARTHROPLASTY Right 05/14/2018   Procedure: RIGHT REVERSE SHOULDER ARTHROPLASTY;  Surgeon: Tania Ade, MD;  Location: [place];  Service: Orthopedics;  Laterality: Right;  . REVERSE SHOULDER REPLACEMENT Right 05/14/2018  . RIGHT HEART CATH  N/A 07/21/2019   Procedure: RIGHT HEART CATH;  Surgeon: Larey Dresser, MD;  Location: [place] CV LAB;  Service: Cardiovascular;  Laterality: N/A;  . TEE WITH CARDIOVERSION  09/22/2017  . TEE WITHOUT CARDIOVERSION N/A 10/03/2017   Procedure: TRANSESOPHAGEAL ECHOCARDIOGRAM (TEE);  Surgeon: Sanda Klein, MD;  Location: [facility] ENDOSCOPY;  Service: Cardiovascular;  Laterality: N/A;  . TEE WITHOUT CARDIOVERSION N/A 08/04/2019   Procedure: TRANSESOPHAGEAL ECHOCARDIOGRAM (TEE);  Surgeon: Larey Dresser, MD;  Location: [facility] ENDOSCOPY;  Service: Cardiovascular;  Laterality: N/A;  . TEE WITHOUT CARDIOVERSION N/A 12/10/2019   Procedure: TRANSESOPHAGEAL ECHOCARDIOGRAM (TEE);  Surgeon: Buford Dresser, MD;  Location: [facility] ENDOSCOPY;  Service: Cardiovascular;  Laterality: N/A;  . THYROIDECTOMY  1998   Dr Margot Chimes  . TONSILLECTOMY    . TOTAL KNEE ARTHROPLASTY  04/13/2012   Procedure: TOTAL KNEE ARTHROPLASTY;  Surgeon: Rudean Haskell, MD;  Location: [place];  Service: Orthopedics;  Laterality: Right;  Marland Kitchen VIDEO BRONCHOSCOPY WITH ENDOBRONCHIAL ULTRASOUND N/A 02/07/2017   Procedure: VIDEO BRONCHOSCOPY WITH  ENDOBRONCHIAL ULTRASOUND;  Surgeon: Marshell Garfinkel, MD;  Location: [place];  Service: Pulmonary;  Laterality: N/A;    Family History  Problem Relation Age of Onset  . Heart disease Father 3       MI _0   . Colon cancer Father        COLON  . Heart attack Father   . Other Mother        temporal arteritis   . Diabetes Sister   . Diabetes Brother   . Diabetes Paternal Aunt   . Diabetes Paternal Grandmother   . Esophageal cancer Neg Hx   . Inflammatory bowel disease Neg Hx   . Liver disease Neg Hx   . Pancreatic cancer Neg Hx   . Stomach cancer Neg Hx     Social History:  reports that she has never smoked. She has never used smokeless tobacco. She reports that she does not drink alcohol and does not use drugs.  Allergies:  Allergies  Allergen Reactions  . Dofetilide Other (See Comments)    Prolonged QT interval   . Ranolazine     Balance issues  . Rivaroxaban Other (See Comments)    Nose Bleed X 6 hrs ; packing in ER Nasal hemorrhage  . Rivaroxaban     Nose Bleed X 6 hrs ; packing in ER Nasal hemorrhage  . Tape Other (See Comments)    SKIN IS THIN AND TEARS EASILY   . Tikosyn [Dofetilide] Other (See Comments)    "Long QT wave"   . Epinephrine Other (See Comments)    Oral anesthetic Dental form only (liquid). Patient stated she will become "out of it" she can hear you but cannot respond in normal fashion. "not fully with it"  . Epinephrine   . Ketamine Other (See Comments)    Hallucinations   . Adhesive [Tape] Rash    Paper tape as well  . Ketamine Other (See Comments)    HALLUCINATIONS    Medications: I have reviewed the patient's current medications.  Results for orders placed or performed during the hospital encounter of 12/15/20 (from the past 48 hour(s))  APTT     Status: Abnormal   Collection Time: 12/17/20  7:14 PM  Result Value Ref Range   aPTT 82 (H) 24 - 36 seconds    Comment:        IF BASELINE aPTT IS ELEVATED, SUGGEST PATIENT RISK  ASSESSMENT BE USED TO DETERMINE APPROPRIATE ANTICOAGULANT THERAPY. Performed  at [facility], [place] [address] [place], [place] [address]   Heparin level (unfractionated)     Status: None   Collection Time: 12/18/20  7:56 AM  Result Value Ref Range   Heparin Unfractionated 0.34 0.30 - 0.70 IU/mL    Comment: (NOTE) If heparin results are below expected values, and patient dosage has  been confirmed, suggest follow up testing of antithrombin III levels. Performed at [facility], [place] [address], [place], [place] [address]   CBC     Status: Abnormal   Collection Time: 12/18/20  7:56 AM  Result Value Ref Range   WBC 8.8 4.0 - 10.5 K/uL   RBC 3.22 (L) 3.87 - 5.11 MIL/uL   Hemoglobin 9.7 (L) 12.0 - 15.0 g/dL   HCT 29.7 (L) 36.0 - 46.0 %   MCV 92.2 80.0 - 100.0 fL   MCH 30.1 26.0 - 34.0 pg   MCHC 32.7 30.0 - 36.0 g/dL   RDW 15.0 11.5 - 15.5 %   Platelets 151 150 - 400 K/uL   nRBC 0.0 0.0 - 0.2 %    Comment: Performed at [facility], [place] [address] [place], [place] [address]  APTT     Status: Abnormal   Collection Time: 12/18/20  7:56 AM  Result Value Ref Range   aPTT 78 (H) 24 - 36 seconds    Comment:        IF BASELINE aPTT IS ELEVATED, SUGGEST PATIENT RISK ASSESSMENT BE USED TO DETERMINE APPROPRIATE ANTICOAGULANT THERAPY. Performed at [facility], [place] [address], [place], [place] [address]   Basic metabolic panel     Status: Abnormal   Collection Time: 12/18/20  7:56 AM  Result Value Ref Range   Sodium 136 135 - 145 mmol/L   Potassium 4.4 3.5 - 5.1 mmol/L   Chloride 103 98 - 111 mmol/L   CO2 28 22 - 32 mmol/L   Glucose, Bld 109 (H) 70 - 99 mg/dL    Comment: Glucose reference range applies only to samples taken after fasting for at least 8 hours.   BUN 20 8 - 23 mg/dL   Creatinine, Ser 0.91 0.44 - 1.00 mg/dL   Calcium 8.3 (L) 8.9 - 10.3 mg/dL   GFR, Estimated >60 >60 mL/min    Comment: (NOTE) Calculated using the CKD-EPI Creatinine Equation  (2021)    Anion gap 5 5 - 15    Comment: Performed at [facility] [address] [place], [place] [address]  Heparin level (unfractionated)     Status: Abnormal   Collection Time: 12/19/20  3:14 AM  Result Value Ref Range   Heparin Unfractionated 0.26 (L) 0.30 - 0.70 IU/mL    Comment: (NOTE) If heparin results are below expected values, and patient dosage has  been confirmed, suggest follow up testing of antithrombin III levels. Performed at [facility], [place] [address], [place], [place] [address]   CBC     Status: Abnormal   Collection Time: 12/19/20  3:14 AM  Result Value Ref Range   WBC 8.8 4.0 - 10.5 K/uL   RBC 3.18 (L) 3.87 - 5.11 MIL/uL   Hemoglobin 9.5 (L) 12.0 - 15.0 g/dL   HCT 30.1 (L) 36.0 - 46.0 %   MCV 94.7 80.0 - 100.0 fL   MCH 29.9 26.0 - 34.0 pg   MCHC 31.6 30.0 - 36.0 g/dL   RDW 15.0 11.5 - 15.5 %   Platelets 146 (  L) 150 - 400 K/uL   nRBC 0.0 0.0 - 0.2 %    Comment: Performed at [facility], [place] [address], [place], [place] [address]  TSH     Status: None   Collection Time: 12/19/20  3:14 AM  Result Value Ref Range   TSH 1.587 0.350 - 4.500 uIU/mL    Comment: Performed by a 3rd Generation assay with a functional sensitivity of <=0.01 uIU/mL. Performed at [facility], [place] [address] [place], [place] [address]   Surgical PCR screen     Status: None   Collection Time: 12/19/20  3:16 AM   Specimen: Nasal Mucosa; Nasal Swab  Result Value Ref Range   MRSA, PCR NEGATIVE NEGATIVE   Staphylococcus aureus NEGATIVE NEGATIVE    Comment: Performed at [facility], [place] [address] [place], [place] [address]   *Note: Due to a large number of results and/or encounters for the requested time period, some results have not been displayed. A complete set of results can be found in Results Review.    MR HIP LEFT WO CONTRAST  Result Date: 12/19/2020 CLINICAL DATA:  Fall, left hip pain with large subcutaneous hematoma EXAM: MR OF THE LEFT  HIP WITHOUT CONTRAST TECHNIQUE: Multiplanar, multisequence MR imaging was performed. No intravenous contrast was administered. COMPARISON:  CT and x-ray 12/15/2020 FINDINGS: Bones: No acute fracture. No dislocation. No femoral head avascular necrosis. No bone marrow edema. No marrow replacing bone lesion. Articular cartilage and labrum Articular cartilage: Mild chondral thinning of the bilateral hip joints without focal cartilage defect. No subchondral marrow signal changes. Labrum: Partial-thickness tear of the anterosuperior labrum (series 11, images 9-10). Joint or bursal effusion Joint effusion:  Trace bilateral joint effusions, nonspecific. Bursae: No abnormal bursal fluid collection. Muscles and tendons Muscles and tendons: Low-grade partial tears of the bilateral hamstring tendon origins. Remaining tendinous structures about the left hip are intact. Intramuscular edema within the distal left gluteus maximus, likely posttraumatic. Other findings Miscellaneous: Large complex fluid collection within the subcutaneous soft tissues overlying the posterolateral aspect of the left hip with layering hematocrit levels and areas of T1 intermediate signal compatible with hematoma. The full extent of the collection was not entirely included within the field of view but measures at least 10 x 10 x 6 cm. Difficult to determine if the collection has significantly changed in size from the prior CT as the full extent of the collection was also not entirely included within the field of view. Extensive surrounding soft tissue edema. Colonic diverticulosis is noted. IMPRESSION: 1. Large hematoma within the subcutaneous soft tissues overlying the left hip measuring at least 10 cm in size. Difficult to determine if the collection has significantly changed in size from the prior CT as the full extent of the collection was also not entirely included within the field of view on either study. 2. No acute osseous abnormality of the left  hip. 3. Mild left hip arthropathy with partial-thickness tear of the anterosuperior labrum. 4. Low-grade partial tears of the bilateral hamstring tendon origins. Electronically Signed   By: Davina Poke D.O.   On: 12/19/2020 11:01    Review of Systems  Constitutional: Negative.   HENT: Negative.   Eyes: Negative.   Respiratory: Negative.   Cardiovascular: Negative.   Gastrointestinal: Negative.   Musculoskeletal:       Left lateral hip pain  Skin:       bruising  Neurological: Negative.   Psychiatric/Behavioral: Negative.  Blood pressure (!) 117/45, pulse 70, temperature 98.2 F (36.8 C), temperature source Oral, resp. rate 17, height _0  (1.676 m), weight 92.5 kg, SpO2 100 %. Physical Exam HENT:     Head: Normocephalic.     Mouth/Throat:     Mouth: Mucous membranes are moist.  Cardiovascular:     Rate and Rhythm: Normal rate.     Pulses: Normal pulses.  Pulmonary:     Effort: Pulmonary effort is normal.  Abdominal:     General: Abdomen is flat.  Musculoskeletal:     Cervical back: Neck supple.     Comments: Left hip with large lateral hematoma.  There is significant skin tension and tertius of the hematoma centrally.  The area of skin tension is approximately 20 x 20 cm.  There is surrounding ecchymosis.  This area is significantly tender to palpation.  She has pain with range of motion of the hip.  Skin:    Findings: Bruising present.  Neurological:     General: No focal deficit present.     Mental Status: She is alert.  Psychiatric:        Mood and Affect: Mood normal.     Assessment/Plan: She has a large lateral hip hematoma that is deep.  She also has surrounding ecchymosis and significant amount of skin tension.  Due to this and the potential for skin compromise she is indicated for incision and drainage of the hematoma and placement of a wound VAC.  We did discuss the possibility of a large area of skin necrosis needing plastic surgery consult for coverage  if this occurs.  So far the skin appears healthy but it is very tense and indurated.  We will proceed with surgery.  She understands the risks, benefits and alternatives which include but are not limited to wound healing complications, infection, need for further surgery, bleeding, damage surrounding structures and the possibility of anesthetic complications.  Erle Crocker 12/19/2020, 3:49 PM

## 2020-12-19 NOTE — Op Note (Signed)
Kayla Harrison female 75 y.o. 12/19/2020  PreOperative Diagnosis: Left lateral thigh deep hematoma  PostOperative Diagnosis: Same  PROCEDURE: Incision and drainage with evacuation of lateral hip hematoma, deep Application of negative pressure wound dressing  SURGEON: Melony Overly, MD  ASSISTANT: Jesse Martinique, PA-S  ANESTHESIA: General LMA anesthesia  FINDINGS: Large indurated hematoma on the lateral aspect of the left hip and greater trochanter region  IMPLANTS: None  INDICATIONS:74 y.o. female had a fall and due to taking Eliquis had a large hematoma formed on the lateral aspect of her left hip.  CT scan and MRI scan demonstrated large pockets of hematoma and significant induration was seen on exam in the area.  She was indicated for drainage.  MRI scan was negative for any acute bone injury about the hip.   Patient understood the risks, benefits and alternatives to surgery which include but are not limited to wound healing complications, infection, nonunion, malunion, need for further surgery as well as damage to surrounding structures. They also understood the potential for continued pain in that there were no guarantees of acceptable outcome After weighing these risks the patient opted to proceed with surgery.  PROCEDURE: Patient was identified in the preoperative holding area.  The left hip was marked by myself.  Consent was signed by myself and the patient.  She was taken to the operative suite and placed supine on the operative table.  General LMA anesthesia was induced without difficulty.  Bump was placed under the left side to elevate the left hip region.  The left lower extremity was prepped and draped in the usual sterile fashion.  I began by making a 4 cm incision overlying the anterior aspect of the hematoma and induration.  This was at a site of viable appearing skin that was not under much tension.  The incision was taken sharply down through skin and  subcutaneous tissue.  Then blunt dissection was performed through the deep tissues and fascia to gain access to the hematoma.  There was a rush of hematoma fluid and some old appearing blood.  Through direct manipulation and evacuation the multiple loculated areas were decompressed and drained.  Sucker probe was placed within the wound to evacuate hematoma.  Then the wound was irrigated copiously with approximately 1000 cc of normal saline.  Then further palpation was performed and there was no further areas of induration or tenseness about the skin.  Then the appropriate sized wound VAC sponge was created.  A single sponge was placed at the site of entry from the skin incision.  Then the wound VAC seal material and suction device was attached.  There was good seal.  There was some fluid material that was seen within the wound VAC tubing.  Dressing was placed around the wound VAC.  She was then awakened from anesthesia and taken recovery in stable condition.  There were no complications.  She tolerated the procedure well.  Counts were correct at the end of the case.   POST OPERATIVE INSTRUCTIONS: Tuesday Thursday Saturday wound VAC changes until output decreases Monitor wound VAC output. Okay to restart heparin drip if wound VAC output is minimal.  Likely postoperative day 1 She may require wound closure at bedside once wound VAC is removed. Okay to be discharged with the wound VAC as long as wound VAC changes occur.  TOURNIQUET TIME: No tourniquet was used  BLOOD LOSS:  Minimal acute blood loss.  Approximately 500 cc of hematoma fluid was evacuated.  DRAINS: none         SPECIMEN: none       COMPLICATIONS:  * No complications entered in OR log *         Disposition: PACU - hemodynamically stable.         Condition: stable

## 2020-12-19 NOTE — Progress Notes (Signed)
Progress Note    HARGUN SPURLING  QQI:[id number] DOB: [birth year]  DOA: 12/15/2020 PCP: [name], MD    Brief Narrative:     Medical records reviewed and are as summarized below:  Kayla Harrison is an 75 y.o. female with medical history significant of thyroid cancer/lung cancer/NHL with h/o SVC syndrome; CVA; pacemaker placement; afib on Eliquis; obesity s/p lap band surgery; hypothyroidism; HTN; and HLD presenting with a fall.  Found to be orthostatic.  Has a large hematoma on left hip that is requiring surgery.   Assessment/Plan:   Principal Problem:   Fall Active Problems:   HYPERLIPIDEMIA   Hypothyroidism   Non-Hodgkin lymphoma, unspecified, intrathoracic lymph nodes (HCC)   Persistent atrial fibrillation (HCC)   Pacemaker   Hip hematoma, left, initial encounter   Nondisplaced fracture of coronoid process of left ulna, initial encounter for closed fracture   Multiple rib fractures   Orthostatic hypotension   History of CVA (cerebrovascular accident)   Falls suspected due to orthostatic hypotension -Patient with recurrent falls -follows with Dr. Caryl Comes and seems to have a h/o orthostatic hypotension although patient describes her falling as "legs giving out" at times and during other times "losing balance" -cards consult: TED hose, florinef, abdominal binder -sleep HOB elevated -OOB as much as possible  Afib -Hold Eliquis given large hematoma - Heparin drip for now and monitor -Continue Amio for rate control -Has pacemaker -cards consult appreciated   L hip hematoma -No obvious MSK injury other than hematoma -Will follow on Heparin drip as she is high risk for CVA -MRI hip and plan for hematoma evacuation  L elbow fracture - splint placed -outpatient follow up with ortho  Possible R rib fractures -Xray with lateral R 7/8 rib fractures which were not seen on CT -Lidocaine patch  HLD -Continue Crestor  Hypothyroidism -Normal  TSH -Continue Synthroid at current dose for now  Class 1 obesity -Body mass index is 32.93 kg/m.  -She is s/p lap band  H/o cancer -NHL with large mediastinal mass involving thyroid/lungs -Seen by Dr. Lindi Adie in 12/021 and no longer needs routine imaging -Annual f/u   Family Communication/Anticipated D/C date and plan/Code Status   DVT prophylaxis: heparin gtt Code Status: Full Code.  Disposition Plan: Status is: Inpatient  Remains inpatient appropriate because:Inpatient level of care appropriate due to severity of illness   Dispo: The patient is from: Home              Anticipated d/c is to: tbd              Patient currently is not medically stable to d/c.   Difficult to place patient No         Medical Consultants:   cards ortho   Subjective:   C/o pain on left shoulder/axialla area   Objective:    Vitals:   12/18/20 1539 12/18/20 1900 12/19/20 0300 12/19/20 0749  BP: (!) 120/53 (!) 118/59 (!) 115/56 (!) 117/45  Pulse: 70 72 60 70  Resp: 20 18 16 17   Temp: 98.3 F (36.8 C) 98 F (36.7 C) 98.2 F (36.8 C) 98.2 F (36.8 C)  TempSrc: Oral Oral Oral Oral  SpO2: 98% 100% 99% 100%  Weight:      Height:        Intake/Output Summary (Last 24 hours) at 12/19/2020 0918 Last data filed at 12/18/2020 2200 Gross per 24 hour  Intake 489.3 ml  Output 300 ml  Net 189.3 ml  Filed Weights   12/15/20 0552 12/15/20 1257  Weight: 94 kg 92.5 kg    Exam:  General: Appearance:    Obese female in no acute distress     Lungs:      respirations unlabored  Heart:    Normal heart rate. irr   MS:   All extremities are intact. Large hematoma on left hip  Neurologic:   Awake, alert, oriented x 3     Data Reviewed:   I have personally reviewed following labs and imaging studies:  Labs: Labs show the following:   Basic Metabolic Panel: Recent Labs  Lab 12/15/20 0559 12/16/20 0251 12/17/20 0122 12/18/20 0756  NA 134* 135 136 136  K 4.7 4.5 4.5 4.4   CL 101 100 103 103  CO2 24 27 28 28   GLUCOSE 104* 94 118* 109*  BUN 21 19 25* 20  CREATININE 0.92 0.99 1.03* 0.91  CALCIUM 8.6* 8.5* 8.3* 8.3*   GFR Estimated Creatinine Clearance: 60.3 mL/min (by C-G formula based on SCr of 0.91 mg/dL). Liver Function Tests: No results for input(s): AST, ALT, ALKPHOS, BILITOT, PROT, ALBUMIN in the last 168 hours. No results for input(s): LIPASE, AMYLASE in the last 168 hours. No results for input(s): AMMONIA in the last 168 hours. Coagulation profile No results for input(s): INR, PROTIME in the last 168 hours.  CBC: Recent Labs  Lab 12/15/20 0559 12/16/20 0251 12/17/20 0122 12/18/20 0756 12/19/20 0314  WBC 8.3 6.3 8.0 8.8 8.8  NEUTROABS 6.2  --   --   --   --   HGB 13.4 12.1 10.7* 9.7* 9.5*  HCT 41.5 38.5 32.6* 29.7* 30.1*  MCV 94.3 94.1 91.3 92.2 94.7  PLT 171 173 172 151 146*   Cardiac Enzymes: No results for input(s): CKTOTAL, CKMB, CKMBINDEX, TROPONINI in the last 168 hours. BNP (last 3 results) No results for input(s): PROBNP in the last 8760 hours. CBG: No results for input(s): GLUCAP in the last 168 hours. D-Dimer: No results for input(s): DDIMER in the last 72 hours. Hgb A1c: No results for input(s): HGBA1C in the last 72 hours. Lipid Profile: No results for input(s): CHOL, HDL, LDLCALC, TRIG, CHOLHDL, LDLDIRECT in the last 72 hours. Thyroid function studies: Recent Labs    12/19/20 0314  TSH 1.587   Anemia work up: Recent Labs    12/17/20 0122  VITAMINB12 479   Sepsis Labs: Recent Labs  Lab 12/16/20 0251 12/17/20 0122 12/18/20 0756 12/19/20 0314  WBC 6.3 8.0 8.8 8.8    Microbiology Recent Results (from the past 240 hour(s))  Resp Panel by RT-PCR (Flu A&B, Covid) Nasopharyngeal Swab     Status: None   Collection Time: 12/15/20  8:44 AM   Specimen: Nasopharyngeal Swab; Nasopharyngeal(NP) swabs in vial transport medium  Result Value Ref Range Status   SARS Coronavirus 2 by RT PCR NEGATIVE NEGATIVE Final     Comment: (NOTE) SARS-CoV-2 target nucleic acids are NOT DETECTED.  The SARS-CoV-2 RNA is generally detectable in upper respiratory specimens during the acute phase of infection. The lowest concentration of SARS-CoV-2 viral copies this assay can detect is 138 copies/mL. A negative result does not preclude SARS-Cov-2 infection and should not be used as the sole basis for treatment or other patient management decisions. A negative result may occur with  improper specimen collection/handling, submission of specimen other than nasopharyngeal swab, presence of viral mutation(s) within the areas targeted by this assay, and inadequate number of viral copies(<138 copies/mL). A negative result must  be combined with clinical observations, patient history, and epidemiological information. The expected result is Negative.  Fact Sheet for Patients:  EntrepreneurPulse.com.au  Fact Sheet for Healthcare Providers:  IncredibleEmployment.be  This test is no t yet approved or cleared by the [place] FDA and  has been authorized for detection and/or diagnosis of SARS-CoV-2 by FDA under an Emergency Use Authorization (EUA). This EUA will remain  in effect (meaning this test can be used) for the duration of the COVID-19 declaration under Section 564(b)(1) of the Act, 21 U.S.C.section 360bbb-3(b)(1), unless the authorization is terminated  or revoked sooner.       Influenza A by PCR NEGATIVE NEGATIVE Final   Influenza B by PCR NEGATIVE NEGATIVE Final    Comment: (NOTE) The Xpert Xpress SARS-CoV-2/FLU/RSV plus assay is intended as an aid in the diagnosis of influenza from Nasopharyngeal swab specimens and should not be used as a sole basis for treatment. Nasal washings and aspirates are unacceptable for Xpert Xpress SARS-CoV-2/FLU/RSV testing.  Fact Sheet for Patients: EntrepreneurPulse.com.au  Fact Sheet for Healthcare  Providers: IncredibleEmployment.be  This test is not yet approved or cleared by the [place] FDA and has been authorized for detection and/or diagnosis of SARS-CoV-2 by FDA under an Emergency Use Authorization (EUA). This EUA will remain in effect (meaning this test can be used) for the duration of the COVID-19 declaration under Section 564(b)(1) of the Act, 21 U.S.C. section 360bbb-3(b)(1), unless the authorization is terminated or revoked.  Performed at [facility], [place] [address], [place], [place] [address]   Surgical PCR screen     Status: None   Collection Time: 12/19/20  3:16 AM   Specimen: Nasal Mucosa; Nasal Swab  Result Value Ref Range Status   MRSA, PCR NEGATIVE NEGATIVE Final   Staphylococcus aureus NEGATIVE NEGATIVE Final    Comment: Performed at [facility], [place] [address] [place], [place] [address]    Procedures and diagnostic studies:  No results found.  Medications:   . amiodarone  200 mg Oral Daily  . docusate sodium  100 mg Oral BID  . fludrocortisone  0.1 mg Oral Daily  . levothyroxine  137 mcg Oral Once per day on Sun Mon Tue Wed Thu Fri  . [START ON 12/23/2020] levothyroxine  68.5 mcg Oral Once per day on Sat  . lidocaine  2 patch Transdermal Q24H  . multivitamin with minerals  1 tablet Oral Daily  . mupirocin ointment  1 application Nasal BID  . rosuvastatin  10 mg Oral Once per day on Mon Thu   Continuous Infusions: . heparin 900 Units/hr (12/19/20 0741)     LOS: 4 days   Geradine Girt  [facility]   How to contact the [facility] Attending or Consulting provider [place] or covering provider during after hours [place], for this patient?  1. Check the care team in [facility] and look for a) attending/consulting TR[facility] provider listed and b) the [facility] team listed 2. Log into [email or web address] and use [place]'s universal password to access. If you do not have the password, please contact the hospital operator. 3. Locate  the [facility] provider you are looking for under [facility] and page to a number that you can be directly reached. 4. If you still have difficulty reaching the provider, please page the [facility] (Director on Call) for the Hospitalists listed on amion for assistance.  12/19/2020, 9:18 AM

## 2020-12-19 NOTE — Progress Notes (Signed)
ANTICOAGULATION CONSULT NOTE - Follow Up Consult  Pharmacy Consult for Heparin Indication: atrial fibrillation  Allergies  Allergen Reactions  . Dofetilide Other (See Comments)    Prolonged QT interval   . Ranolazine     Balance issues  . Rivaroxaban Other (See Comments)    Nose Bleed X 6 hrs ; packing in ER Nasal hemorrhage  . Rivaroxaban     Nose Bleed X 6 hrs ; packing in ER Nasal hemorrhage  . Tape Other (See Comments)    SKIN IS THIN AND TEARS EASILY   . Tikosyn [Dofetilide] Other (See Comments)    "Long QT wave"   . Epinephrine Other (See Comments)    Oral anesthetic Dental form only (liquid). Patient stated she will become "out of it" she can hear you but cannot respond in normal fashion. "not fully with it"  . Epinephrine   . Ketamine Other (See Comments)    Hallucinations   . Adhesive [Tape] Rash    Paper tape as well  . Ketamine Other (See Comments)    HALLUCINATIONS    Patient Measurements: Height: 5\' 6"  (167.6 cm) Weight: 92.5 kg (204 lb) IBW/kg (Calculated) : 59.3 Heparin Dosing Weight:    Vital Signs: Temp: 98.2 F (36.8 C) (03/29 0300) Temp Source: Oral (03/29 0300) BP: 115/56 (03/29 0300) Pulse Rate: 60 (03/29 0300)  Labs: Recent Labs    12/17/20 0122 12/17/20 0321 12/17/20 1914 12/18/20 0756 12/19/20 0314  HGB 10.7*  --   --  9.7* 9.5*  HCT 32.6*  --   --  29.7* 30.1*  PLT 172  --   --  151 146*  APTT  --  178* 82* 78*  --   HEPARINUNFRC 0.94*  --   --  0.34 0.26*  CREATININE 1.03*  --   --  0.91  --     Estimated Creatinine Clearance: 60.3 mL/min (by C-G formula based on SCr of 0.91 mg/dL).   Assessment: Anticoag:  s/p fall, on Eliquis PTA for afib with last dose taken 3/24 @1900 .  CT head negative, elbow/rib fx, large hip hematoma, OK to resume AC per MD.   - Will use low end goals and will not bolus. - Hep level 0.26 slightly < goal.  Hgb 13.4 baseline>9.5. Plts 146.  Goal of Therapy:  Heparin level 0.3-0.5  units/ml Monitor platelets by anticoagulation protocol: Yes   Plan:  Increase heparin slightly to 900 units/hr Daily HL and CBC  Do not resume oral anticoagulation until hematoma treatment plans delineated   Eric Nees S. Alford [place], PharmD, BCPS Clinical Staff Pharmacist [email or web address] Alford Highland, Taleah Bellantoni Stillinger 12/19/2020,7:09 AM

## 2020-12-19 NOTE — Care Management Important Message (Signed)
Important Message  Patient Details  Name: Kayla Harrison MRN: [id number] Date of Birth: 1946/09/02   Medicare Important Message Given:  Yes     Vanisha Whiten P [place] 12/19/2020, 12:22 PM

## 2020-12-19 NOTE — Progress Notes (Signed)
Inpatient Rehabilitation Admissions Coordinator  Inpatient rehab consult received. I will follow up postop tomorrow to assist with planning rehab venue options.  Danne Baxter, RN, MSN Rehab Admissions Coordinator [phone] 12/19/2020 2:39 PM

## 2020-12-19 NOTE — Progress Notes (Signed)
Inpatient Rehab Admissions Coordinator Note:   Per updated OT recommendations, pt was screened for CIR candidacy by Shann Medal, PT, DPT.  At this time we are recommending a CIR consult and I will place an order per our protocol.  Please contact me with questions.   Shann Medal, PT, DPT [phone] 12/19/20 1:13 PM

## 2020-12-19 NOTE — Progress Notes (Signed)
Patient Name: Kayla Harrison      SUBJECTIVE: Admitted with a fall fracture of her elbow, ribs.  Also bruised hip Orthostatic vital signs showed profound decrease in blood pressure 110--80 going from lying to sitting.  Unable to stand. Has been mostly in bed since No sob  For surgery today   Blood pressures better   Past Medical History:  Diagnosis Date  . Anemia   . Arthritis    osteoarthritis - knees and right shoulder  . Blood transfusion without reported diagnosis   . Breast cancer ([facility] [place])    Dr Margot Chimes, total thyroidectomy- 1999- for cancer  . 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 Chronic bilateral pleural effusions   . Colon polyp    Dr Earlean Shawl  . Complication of anesthesia    Ketamine produces LSD reaction, bright colored nightmarish experience   . Dyslipidemia   . Endometriosis   . Fibroids   . H/O pleural effusion    s/p thoracentesis w 3228ml withdrawn  . Hepatitis    [place]hepatitis   . History of dysphagia    due to radiation therapy  . History of hiatal hernia    small noted on PET scan  . Hypertension   . Hypothyroidism   . Lung cancer, lower lobe ([place]) 01/2017   radiation RX completed 03/04/17; will start chemo 6/27, pt unaware of lung cancer  . Morbid obesity ([place])    Status post lap band surgery  . Nephrolithiasis   . Non Hodgkin's lymphoma ([place])    on chemotherapy  . Persistent atrial fibrillation ([place])    a. s/p PVI 2008 b. s/p convergent ablation 0960 complicated by bradycardia requiring pacemaker implant  . Personal history of radiation therapy   . Presence of permanent cardiac pacemaker   . Rotator cuff tear    Right  . Stroke [facility])    2003- [place] x2  . SVC syndrome    with lung mass and non hodgkins lymphoma  . Thyroid cancer ([place]) 2000    Scheduled Meds:  Scheduled Meds: . amiodarone  200 mg Oral Daily  . docusate sodium  100 mg Oral BID  . fludrocortisone  0.1 mg Oral Daily  . levothyroxine   137 mcg Oral Once per day on Sun Mon Tue Wed Thu Fri  . [START ON 12/23/2020] levothyroxine  68.5 mcg Oral Once per day on Sat  . lidocaine  2 patch Transdermal Q24H  . multivitamin with minerals  1 tablet Oral Daily  . mupirocin ointment  1 application Nasal BID  . rosuvastatin  10 mg Oral Once per day on Mon Thu   Continuous Infusions: . heparin 850 Units/hr (12/18/20 2200)   acetaminophen **OR** acetaminophen, bisacodyl, HYDROcodone-acetaminophen, ondansetron **OR** ondansetron (ZOFRAN) IV, polyethylene glycol, senna    PHYSICAL EXAM Vitals:   12/18/20 1400 12/18/20 1539 12/18/20 1900 12/19/20 0300  BP: (!) 108/50 (!) 120/53 (!) 118/59 (!) 115/56  Pulse:  70 72 60  Resp:  20 18 16   Temp:  98.3 F (36.8 C) 98 F (36.7 C) 98.2 F (36.8 C)  TempSrc:  Oral Oral Oral  SpO2:  98% 100% 99%  Weight:      Height:       Well developed and nourished in no acute distress HENT normal Neck supple  Clear laterally  Regular rate and rhythm, no murmurs or gallops Abd-soft with active BS No Clubbing cyanosis edema Skin-warm and dry A &  Oriented  Grossly normal sensory and motor function      Tel a pacing  Intake/Output Summary (Last 24 hours) at 12/19/2020 0723 Last data filed at 12/18/2020 2200 Gross per 24 hour  Intake 729.3 ml  Output 500 ml  Net 229.3 ml    LABS: Basic Metabolic Panel: Recent Labs  Lab 12/15/20 0559 12/16/20 0251 12/17/20 0122 12/18/20 0756  NA 134* 135 136 136  K 4.7 4.5 4.5 4.4  CL 101 100 103 103  CO2 24 27 28 28   GLUCOSE 104* 94 118* 109*  BUN 21 19 25* 20  CREATININE 0.92 0.99 1.03* 0.91  CALCIUM 8.6* 8.5* 8.3* 8.3*   Cardiac Enzymes: No results for input(s): CKTOTAL, CKMB, CKMBINDEX, TROPONINI in the last 72 hours. CBC: Recent Labs  Lab 12/15/20 0559 12/16/20 0251 12/17/20 0122 12/18/20 0756 12/19/20 0314  WBC 8.3 6.3 8.0 8.8 8.8  NEUTROABS 6.2  --   --   --   --   HGB 13.4 12.1 10.7* 9.7* 9.5*  HCT 41.5 38.5 32.6* 29.7* 30.1*   MCV 94.3 94.1 91.3 92.2 94.7  PLT 171 173 172 151 146*   PROTIME: No results for input(s): LABPROT, INR in the last 72 hours. Liver Function Tests: No results for input(s): AST, ALT, ALKPHOS, BILITOT, PROT, ALBUMIN in the last 72 hours. No results for input(s): LIPASE, AMYLASE in the last 72 hours. BNP: BNP (last 3 results) No results for input(s): BNP in the last 8760 hours.  ProBNP (last 3 results) No results for input(s): PROBNP in the last 8760 hours.  D-Dimer: No results for input(s): DDIMER in the last 72 hours. Hemoglobin A1C: No results for input(s): HGBA1C in the last 72 hours. Fasting Lipid Panel: No results for input(s): CHOL, HDL, LDLCALC, TRIG, CHOLHDL, LDLDIRECT in the last 72 hours. Thyroid Function Tests: Recent Labs    12/19/20 0314  TSH 1.587   Anemia Panel: Recent Labs    12/17/20 0122  VITAMINB12 479     Device Interrogation:* pending    ASSESSMENT AND PLAN:  Atrial fibrillation-persistent  Amiodarone therapy-longstanding  Sinus node dysfunction status post pacemaker  Orthostatic hypotension-profound  Falls-recurrent   BP better Fatigued with sitting up but imperative For drain today  Signed, [name] MD  12/19/2020

## 2020-12-19 NOTE — Transfer of Care (Signed)
Immediate Anesthesia Transfer of Care Note  Patient: Kayla Harrison  Procedure(s) Performed: IRRIGATION AND DEBRIDEMENT OF LEFT HIP HEMATOMA WITH APPLICATION OF WOUND VAC (Left Hip)  Patient Location: PACU  Anesthesia Type:General  Level of Consciousness: awake, alert  and oriented  Airway & Oxygen Therapy: Patient Spontanous Breathing and Patient connected to nasal cannula oxygen  Post-op Assessment: Report given to RN and Post -op Vital signs reviewed and stable  Post vital signs: Reviewed and stable  Last Vitals:  Vitals Value Taken Time  BP 122/61 12/19/20 1815  Temp 36.9 C 12/19/20 1815  Pulse 70 12/19/20 1826  Resp 21 12/19/20 1826  SpO2 96 % 12/19/20 1826  Vitals shown include unvalidated device data.  Last Pain:  Vitals:   12/19/20 1815  TempSrc:   PainSc: 0-No pain      Patients Stated Pain Goal: 3 (91/50/56 9794)  Complications: No complications documented.

## 2020-12-19 NOTE — Progress Notes (Signed)
Per order, changed device settings for MRI to   DOO at 85 bpm  Will program device back to pre-MRI settings after completion of exam, and send transmission 

## 2020-12-20 ENCOUNTER — Encounter (HOSPITAL_COMMUNITY): Payer: Self-pay | Admitting: Orthopaedic Surgery

## 2020-12-20 DIAGNOSIS — C8522 Mediastinal (thymic) large B-cell lymphoma, intrathoracic lymph nodes: Secondary | ICD-10-CM

## 2020-12-20 DIAGNOSIS — I951 Orthostatic hypotension: Secondary | ICD-10-CM | POA: Diagnosis not present

## 2020-12-20 DIAGNOSIS — W19XXXD Unspecified fall, subsequent encounter: Secondary | ICD-10-CM | POA: Diagnosis not present

## 2020-12-20 LAB — BASIC METABOLIC PANEL
Anion gap: 5 (ref 5–15)
BUN: 20 mg/dL (ref 8–23)
CO2: 28 mmol/L (ref 22–32)
Calcium: 8.2 mg/dL — ABNORMAL LOW (ref 8.9–10.3)
Chloride: 104 mmol/L (ref 98–111)
Creatinine, Ser: 0.84 mg/dL (ref 0.44–1.00)
GFR, Estimated: >60 mL/min (ref >60)
Glucose, Bld: 142 mg/dL — ABNORMAL HIGH (ref 70–99)
Potassium: 4.8 mmol/L (ref 3.5–5.1)
Sodium: 137 mmol/L (ref 135–145)

## 2020-12-20 LAB — CBC
HCT: 25.7 % — ABNORMAL LOW (ref 36.0–46.0)
Hemoglobin: 8.3 g/dL — ABNORMAL LOW (ref 12.0–15.0)
MCH: 30.3 pg (ref 26.0–34.0)
MCHC: 32.3 g/dL (ref 30.0–36.0)
MCV: 93.8 fL (ref 80.0–100.0)
Platelets: 179 10*3/uL (ref 150–400)
RBC: 2.74 MIL/uL — ABNORMAL LOW (ref 3.87–5.11)
RDW: 15 % (ref 11.5–15.5)
WBC: 14.1 10*3/uL — ABNORMAL HIGH (ref 4.0–10.5)
nRBC: 0 % (ref 0.0–0.2)

## 2020-12-20 LAB — HEPARIN LEVEL (UNFRACTIONATED)
Heparin Unfractionated: 0.2 IU/mL — ABNORMAL LOW (ref 0.30–0.70)
Heparin Unfractionated: 0.19 IU/mL — ABNORMAL LOW (ref 0.30–0.70)

## 2020-12-20 MED ORDER — SODIUM CHLORIDE 0.9 % IV SOLN
INTRAVENOUS | Status: DC | PRN
  Administered 2020-12-20: 250 mL via INTRAVENOUS

## 2020-12-20 MED ORDER — FLUDROCORTISONE ACETATE 0.1 MG PO TABS
0.1000 mg | ORAL_TABLET | Freq: Two times a day (BID) | ORAL | Status: DC
  Administered 2020-12-20 – 2020-12-22 (×5): 0.1 mg via ORAL
  Filled 2020-12-20 (×6): qty 1

## 2020-12-20 MED ORDER — SODIUM CHLORIDE 0.9 % IV SOLN
125.0000 mg | Freq: Once | INTRAVENOUS | Status: AC
  Administered 2020-12-20: 125 mg via INTRAVENOUS
  Filled 2020-12-20 (×2): qty 10

## 2020-12-20 MED ORDER — SODIUM CHLORIDE 0.9 % IV SOLN: 510.0000 mg | Freq: Once | INTRAVENOUS | Status: DC

## 2020-12-20 NOTE — Progress Notes (Addendum)
ANTICOAGULATION CONSULT NOTE - Follow Up Consult  Pharmacy Consult for IV Heparin Indication: atrial fibrillation  Allergies  Allergen Reactions  . Dofetilide Other (See Comments)    Prolonged QT interval   . Ranolazine     Balance issues  . Rivaroxaban Other (See Comments)    Nose Bleed X 6 hrs ; packing in ER Nasal hemorrhage  . Rivaroxaban     Nose Bleed X 6 hrs ; packing in ER Nasal hemorrhage  . Tape Other (See Comments)    SKIN IS THIN AND TEARS EASILY   . Tikosyn [Dofetilide] Other (See Comments)    "Long QT wave"   . Epinephrine Other (See Comments)    Oral anesthetic Dental form only (liquid). Patient stated she will become "out of it" she can hear you but cannot respond in normal fashion. "not fully with it"  . Epinephrine   . Ketamine Other (See Comments)    Hallucinations   . Adhesive [Tape] Rash    Paper tape as well  . Ketamine Other (See Comments)    HALLUCINATIONS    Patient Measurements: Height: 5\' 6"  (167.6 cm) Weight: 92.5 kg (204 lb) IBW/kg (Calculated) : 59.3 Heparin Dosing Weight: 80.5 kg  Vital Signs: Temp: 98.7 F (37.1 C) (03/30 1550) Temp Source: Oral (03/30 1550) BP: 104/48 (03/30 1550) Pulse Rate: 71 (03/30 1550)  Labs: Recent Labs    12/17/20 1914 12/18/20 0756 12/18/20 0756 12/19/20 0314 12/20/20 0650 12/20/20 1659  HGB  --  9.7*   < > 9.5* 8.3*  --   HCT  --  29.7*  --  30.1* 25.7*  --   PLT  --  151  --  146* 179  --   APTT 82* 78*  --   --   --   --   HEPARINUNFRC  --  0.34   < > 0.26* 0.20* 0.19*  CREATININE  --  0.91  --   --  0.84  --    < > = values in this interval not displayed.    Estimated Creatinine Clearance: 65.3 mL/min (by C-G formula based on SCr of 0.84 mg/dL).   Assessment: 75 yr old female admitted 12/15/20 after fall, with elbow/rib fracture and large hip hematoma; head CT negative. Pt was on apixaban for atrial fibrillation PTA, with last dose on 3/24 at 1900. Pharmacy was consulted to dose IV  heparin. Due to large hip hematoma (500 ml fluid evacuated on 3/29), we are using lower end of heparin goals (0.3-0.5 units/ml) and omitting boluses.   Heparin level ~6.5 hrs after heparin infusion was increased to 1000 units/hr was 0.19 units/ml, which remains below the goal range for this pt. H/H 8.3/25.7, plt 179. Per RN, no current issues with IV or bleeding observed (RN did state that pt has a lot of bruising around IV site that infiltrated yesterday; heparin IV has been running well today).  Goal of Therapy:  Heparin level 0.3-0.5 units/ml Monitor platelets by anticoagulation protocol: Yes   Plan:  Increase heparin infusion to 1100 units/hr Check heparin level in 8 hrs Monitor daily heparin level, CBC Monitor for bleeding F/U plans for transitioning back to oral anticogulant  Gillermina Hu, PharmD, BCPS, [facility] Clinical Pharmacist 12/20/2020,7:07 PM

## 2020-12-20 NOTE — H&P (Signed)
Physical Medicine and Rehabilitation Admission H&P    CC: Functional deficits  HPI: Kayla Harrison is a 75 year old female with history of Lung cancer, NHL w/ SVC syndrome, lap band surgery, A fib--on Eliquis, chronic LBP, CVA with expressive deficits (CIR  who was admitted on 12/15/20 after a fall with reports of HA, acute on chronic neck and back pain as well as increase in weakness. She was found to have non-displaced left coronoid process fracture with question of lucency over lateral aspect of radial head and large SQ hematoma left hip with moderate OA. Ortho evaluated patient and recommended WBAT left elbow and questioned need of I & D of left hip hematoma. Right rib pain treated with lidocaine patch.  Eliquis held and she was placed on heparin bridge.   She continued to have severe hip pain with significant edema and concerns of skin necrosis. MRI left hip showed large hematoma left hip 10 cm in size, partial thickness tear of anterosuperior labrum and low grade partial tears of bilateral hamstring tendon origin. She was taken to OR on 03/29 for I & D of latera hip hematoma by Dr. Lucia Gaskins.  Dr. Caryl Comes consulted for input on h/o recurrent falls and profound orthostatic hypotension with near syncope with activity. Florinef added and titrated upwards. Post op wound VAC removed 03/31 and wound closed. She has not had BM since surgery and Mag citrate added today.  She is WBAT and showing improvement in activity tolerance but continues to be limited by weakness with mild dizziness and HA. CIR recommended due to functional decline.     Review of Systems  Constitutional: Positive for malaise/fatigue. Negative for fever.  HENT: Negative for hearing loss.   Eyes: Negative for blurred vision.  Respiratory: Positive for shortness of breath.   Cardiovascular: Negative for chest pain and claudication.  Gastrointestinal: Positive for constipation. Negative for nausea and vomiting.  Genitourinary:  Positive for dysuria, frequency and hematuria.  Musculoskeletal: Positive for back pain, joint pain and myalgias.  Skin: Positive for itching.  Neurological: Positive for dizziness and weakness.  Psychiatric/Behavioral: Negative for depression and suicidal ideas.      Past Medical History:  Diagnosis Date  . Anemia   . Arthritis    osteoarthritis - knees and right shoulder  . Blood transfusion without reported diagnosis   . Breast cancer ([place])    Dr Margot Chimes, total thyroidectomy- 1999- for cancer  . 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 Chronic bilateral pleural effusions   . Colon polyp    Dr Earlean Shawl  . Complication of anesthesia    Ketamine produces LSD reaction, bright colored nightmarish experience   . Dyslipidemia   . Endometriosis   . Fibroids   . H/O pleural effusion    s/p thoracentesis w 3232m withdrawn  . Hepatitis    [place] ?hepatitis   . History of dysphagia    due to radiation therapy  . History of hiatal hernia    small noted on PET scan  . Hypertension   . Hypothyroidism   . Lung cancer, lower lobe ([facility] 01[place]   radiation RX completed 03/04/17; will start chemo 6/27, pt unaware of lung cancer  . Morbid [facility] ([facility]    Status post lap band surgery  . Nephrolithiasis   . Non Hodgkin's lymphoma ([facility]    on chemotherapy  . Persistent atrial fibrillation ([facility]    a. s/p PVI 2008 b. s/p convergent ablation [id number] by bradycardia  requiring pacemaker implant  . Personal history of radiation therapy   . Presence of permanent cardiac pacemaker   . Rotator cuff tear    Right  . Stroke [facility])    2003- [place] x2  . SVC syndrome    with lung mass and non hodgkins lymphoma  . Thyroid cancer ([place]) 2000    Past Surgical History:  Procedure Laterality Date  . ABDOMINAL HYSTERECTOMY  1983  . afib ablation     a. 2008 PVI b. 2014 convergent ablation  . APPENDECTOMY    . BONE MARROW BIOPSY  02/21/2017  . BREAST LUMPECTOMY Left 2010   . bso  1998  . CARDIAC CATHETERIZATION     2015- negative  . CARDIOVERSION  10/09/2012   Procedure: CARDIOVERSION;  Surgeon: Minus Breeding, MD;  Location: [place];  Service: Cardiovascular;  Laterality: N/A;  . CARDIOVERSION  10/09/2012   Procedure: CARDIOVERSION;  Surgeon: Minus Breeding, MD;  Location: [facility] ENDOSCOPY;  Service: Cardiovascular;  Laterality: N/A;  [name] gave the ok to add pt to the add on , but we must check to find out if the can add pt on at [phone] ( [phone])  . CARDIOVERSION N/A 11/20/2012   Procedure: CARDIOVERSION;  Surgeon: Fay Records, MD;  Location: [place];  Service: Cardiovascular;  Laterality: N/A;  . CARDIOVERSION N/A 07/18/2017   Procedure: CARDIOVERSION;  Surgeon: Thayer Headings, MD;  Location: [facility] ENDOSCOPY;  Service: Cardiovascular;  Laterality: N/A;  . CARDIOVERSION N/A 10/03/2017   Procedure: CARDIOVERSION;  Surgeon: Sanda Klein, MD;  Location: [facility] ENDOSCOPY;  Service: Cardiovascular;  Laterality: N/A;  . CARDIOVERSION N/A 01/07/2018   Procedure: CARDIOVERSION;  Surgeon: Thayer Headings, MD;  Location: [facility] ENDOSCOPY;  Service: Cardiovascular;  Laterality: N/A;  . CARDIOVERSION N/A 12/10/2019   Procedure: CARDIOVERSION;  Surgeon: Buford Dresser, MD;  Location: [facility] ENDOSCOPY;  Service: Cardiovascular;  Laterality: N/A;  . CHOLECYSTECTOMY    . COLONOSCOPY W/ POLYPECTOMY     Dr Earlean Shawl  . CYSTOSCOPY N/A 02/06/2015   Procedure: CYSTOSCOPY;  Surgeon: Kathie Rhodes, MD;  Location: [facility] ORS;  Service: Urology;  Laterality: N/A;  . CYSTOSCOPY W/ RETROGRADES Left 11/17/2017   Procedure: CYSTOSCOPY WITH RETROGRADE /PYELOGRAM/;  Surgeon: Kathie Rhodes, MD;  [facility];  Service: Urology;  Laterality: Left;  . CYSTOSCOPY WITH RETROGRADE PYELOGRAM, URETEROSCOPY AND STENT PLACEMENT Right 02/06/2015   Procedure: RETROGRADE PYELOGRAM, RIGHT URETEROSCOPY STENT PLACEMENT;  Surgeon: Kathie Rhodes, MD;  [facility];  Service: Urology;  Laterality: Right;  . CYSTOSCOPY  WITH RETROGRADE PYELOGRAM, URETEROSCOPY AND STENT PLACEMENT Right 03/07/2017   Procedure: CYSTOSCOPY WITH RIGHT RETROGRADE PYELOGRAM,RIGHT  URETEROSCOPYLASER LITHOTRIPSY  AND STENT PLACEMENT AND STONE BASKETRY;  Surgeon: Kathie Rhodes, MD;  Location: [place];  Service: Urology;  Laterality: Right;  . EYE SURGERY     cataract surgery  . fatty mass removal  1999   pubic area  . HOLMIUM LASER APPLICATION N/A 4/76/5465   Procedure: HOLMIUM LASER APPLICATION;  Surgeon: Kathie Rhodes, MD;  [facility];  Service: Urology;  Laterality: N/A;  . HOLMIUM LASER APPLICATION Right 0/35/4656   Procedure: HOLMIUM LASER APPLICATION;  Surgeon: Kathie Rhodes, MD;  Location: [facility];  Service: Urology;  Laterality: Right;  . HOLMIUM LASER APPLICATION Left 05/04/7516   Procedure: HOLMIUM LASER APPLICATION;  Surgeon: Kathie Rhodes, MD;  [facility];  Service: Urology;  Laterality: Left;  . I & D EXTREMITY Left 12/19/2020   Procedure: IRRIGATION AND DEBRIDEMENT OF LEFT  HIP HEMATOMA WITH APPLICATION OF WOUND VAC;  Surgeon: Erle Crocker, MD;  Location: [place];  Service: Orthopedics;  Laterality: Left;  . IR FLUORO GUIDE PORT INSERTION RIGHT  02/24/2017  . IR NEPHROSTOMY PLACEMENT RIGHT  11/17/2017  . IR PATIENT EVAL TECH 0-60 MINS  03/11/2017  . IR REMOVAL TUN ACCESS W/ PORT W/O FL MOD SED  04/20/2018  . IR US GUIDE VASC ACCESS RIGHT  02/24/2017  . KNEE ARTHROSCOPY     bilateral  . LAPAROSCOPIC GASTRIC BANDING  07/10/2010  . LAPAROSCOPIC GASTRIC BANDING     Laparoscopic adjustable banding APS System with posterior hiatal hernia, 2 suture.  Marland Kitchen LAPAROTOMY     for ruptured ovary and ovarian artery   . NEPHROLITHOTOMY Right 11/17/2017   Procedure: NEPHROLITHOTOMY PERCUTANEOUS;  Surgeon: Kathie Rhodes, MD;  [facility];  Service: Urology;  Laterality: Right;  . PACEMAKER INSERTION  03/10/2013   MDT dual chamber PPM  . POCKET REVISION N/A 12/08/2013   Procedure: POCKET  REVISION;  Surgeon: Deboraha Sprang, MD;  Location: [facility] CATH LAB;  Service: Cardiovascular;  Laterality: N/A;  . PORTA CATH INSERTION    . REVERSE SHOULDER ARTHROPLASTY Right 05/14/2018   Procedure: RIGHT REVERSE SHOULDER ARTHROPLASTY;  Surgeon: Tania Ade, MD;  Location: [place];  Service: Orthopedics;  Laterality: Right;  . REVERSE SHOULDER REPLACEMENT Right 05/14/2018  . RIGHT HEART CATH N/A 07/21/2019   Procedure: RIGHT HEART CATH;  Surgeon: Larey Dresser, MD;  Location: [place] CV LAB;  Service: Cardiovascular;  Laterality: N/A;  . TEE WITH CARDIOVERSION  09/22/2017  . TEE WITHOUT CARDIOVERSION N/A 10/03/2017   Procedure: TRANSESOPHAGEAL ECHOCARDIOGRAM (TEE);  Surgeon: Sanda Klein, MD;  Location: [facility] ENDOSCOPY;  Service: Cardiovascular;  Laterality: N/A;  . TEE WITHOUT CARDIOVERSION N/A 08/04/2019   Procedure: TRANSESOPHAGEAL ECHOCARDIOGRAM (TEE);  Surgeon: Larey Dresser, MD;  Location: [facility] ENDOSCOPY;  Service: Cardiovascular;  Laterality: N/A;  . TEE WITHOUT CARDIOVERSION N/A 12/10/2019   Procedure: TRANSESOPHAGEAL ECHOCARDIOGRAM (TEE);  Surgeon: Buford Dresser, MD;  Location: [facility] ENDOSCOPY;  Service: Cardiovascular;  Laterality: N/A;  . THYROIDECTOMY  1998   Dr Margot Chimes  . TONSILLECTOMY    . TOTAL KNEE ARTHROPLASTY  04/13/2012   Procedure: TOTAL KNEE ARTHROPLASTY;  Surgeon: Rudean Haskell, MD;  Location: [place];  Service: Orthopedics;  Laterality: Right;  Marland Kitchen VIDEO BRONCHOSCOPY WITH ENDOBRONCHIAL ULTRASOUND N/A 02/07/2017   Procedure: VIDEO BRONCHOSCOPY WITH ENDOBRONCHIAL ULTRASOUND;  Surgeon: Marshell Garfinkel, MD;  Location: [place];  Service: Pulmonary;  Laterality: N/A;    Family History  Problem Relation Age of Onset  . Heart disease Father 52       MI _0   . Colon cancer Father        COLON  . Heart attack Father   . Other Mother        temporal arteritis   . Diabetes Sister   . Diabetes Brother   . Diabetes Paternal Aunt   . Diabetes Paternal Grandmother    . Esophageal cancer Neg Hx   . Inflammatory bowel disease Neg Hx   . Liver disease Neg Hx   . Pancreatic cancer Neg Hx   . Stomach cancer Neg Hx     Social History:  reports that she has never smoked. She has never used smokeless tobacco. She reports that she does not drink alcohol and does not use drugs.   Allergies  Allergen Reactions  . Dofetilide Other (See Comments)    Prolonged QT interval   .  Ranolazine     Balance issues  . Rivaroxaban Other (See Comments)    Nose Bleed X 6 hrs ; packing in ER Nasal hemorrhage  . Rivaroxaban     Nose Bleed X 6 hrs ; packing in ER Nasal hemorrhage  . Tape Other (See Comments)    SKIN IS THIN AND TEARS EASILY   . Tikosyn [Dofetilide] Other (See Comments)    "Long QT wave"   . Epinephrine Other (See Comments)    Oral anesthetic Dental form only (liquid). Patient stated she will become "out of it" she can hear you but cannot respond in normal fashion. "not fully with it"  . Epinephrine   . Ketamine Other (See Comments)    Hallucinations   . Adhesive [Tape] Rash    Paper tape as well  . Ketamine Other (See Comments)    HALLUCINATIONS   Facility-Administered Medications Prior to Admission  Medication Dose Route Frequency Provider Last Rate Last Admin  . sodium chloride flush (NS) 0.9 % injection 3 mL  3 mL Intravenous Q12H Deboraha Sprang, MD       Medications Prior to Admission  Medication Sig Dispense Refill  . acetaminophen (TYLENOL) 500 MG tablet Take 500-1,000 mg by mouth every 6 (six) hours as needed for mild pain or headache.    Marland Kitchen amiodarone (PACERONE) 200 MG tablet Take 1 tablet (200 mg total) by mouth daily. 90 tablet 3  . calcium-vitamin D (OSCAL WITH D) 500-200 MG-UNIT tablet Take 2 tablets by mouth daily with breakfast. (Patient taking differently: Take 1 tablet by mouth daily with breakfast.) 180 tablet 1  . ELIQUIS 5 MG TABS tablet TAKE 1 TABLET BY MOUTH  TWICE DAILY (Patient taking differently: Take 5 mg by mouth  2 (two) times daily.) 180 tablet 2  . furosemide (LASIX) 20 MG tablet Take 20 mg by mouth daily as needed for fluid or edema.     Marland Kitchen levothyroxine (SYNTHROID) 137 MCG tablet Take 68.5-137 mcg by mouth See admin instructions. Taking 137 mcg tablet daily except on Saturday taking 1/2 tablet    . Lifitegrast 5 % SOLN Place 1 drop into both eyes at bedtime.     . Multiple Vitamins-Minerals (ONE-A-DAY WOMENS 50+ ADVANTAGE) TABS Take 1 tablet by mouth daily with breakfast.    . rosuvastatin (CRESTOR) 10 MG tablet Take 10 mg by mouth 2 (two) times a week. Takes on Monday and Thursday    . senna (SENOKOT) 8.6 MG tablet Take 1 tablet by mouth as needed for constipation.      Drug Regimen Review  Drug regimen was reviewed and remains appropriate with no significant issues identified  Home: Home Living Family/patient expects to be discharged to:: Private residence Living Arrangements: Alone Available Help at Discharge: Friend(s),Available PRN/intermittently Type of Home: House Home Access: Level entry Home Layout: One level Bathroom Shower/Tub: Multimedia programmer: Handicapped height Home Equipment: Environmental consultant - 4 wheels,Shower seat - built in  Lives With: Alone   Functional History: Prior Function Level of Independence: Independent with assistive device(s) Comments: RW for mobility.  she has used a cane in the past.  Functional Status:  Mobility: Bed Mobility Overal bed mobility: Needs Assistance Bed Mobility: Supine to Sit,Sit to Supine Supine to sit: Min assist Sit to supine: Mod assist General bed mobility comments: Min assistance to rise into sitting and advance B LEs to edge of bed.  Pt with use of holding to PTA hands this session to pull trunk upright.  Pt  sat edge of bed extended time and denies dizziness. Transfers Overall transfer level: Needs assistance Equipment used: Rolling walker (2 wheeled) Transfers: Sit to/from Stand Sit to Stand: Min assist,From elevated  surface Stand pivot transfers: Min assist General transfer comment: Cues for hand placement to and from seated surface.  Pt denies dizziness in standing. Ambulation/Gait Ambulation/Gait assistance: Min assist,Mod assist Gait Distance (Feet): 6 Feet Assistive device: Rolling walker (2 wheeled) Gait Pattern/deviations: Step-to pattern,Trunk flexed,Decreased stride length General Gait Details: Pt performed short bout of gt training before requesting to sit and become non verbal with +syncopal episode.  PTA assisted patient to seated surface with mod-max assistance at edge of bed.  BP 106/66 after resting in modified supine position with trunk down and feet in dependent position.  Pt came to and PTA assisted from edge of bed to recliner. Gait velocity: decreased    ADL: ADL Overall ADL's : Needs assistance/impaired Eating/Feeding: Set up,Sitting Grooming: Wash/dry hands,Wash/dry face,Oral care,Set up,Sitting Upper Body Dressing : Moderate assistance,Sitting Lower Body Dressing: Moderate assistance,Sit to/from stand Toilet Transfer: Minimal Furniture conservator/restorer Details (indicate cue type and reason): bed>recliner on her left Functional mobility during ADLs: Minimal assistance General ADL Comments: HHA  Cognition: Cognition Overall Cognitive Status: Within Functional Limits for tasks assessed Arousal/Alertness: Awake/alert Orientation Level: Oriented X4 Attention: Sustained Sustained Attention: Appears intact Memory: Impaired Memory Impairment: Retrieval deficit Awareness: Appears intact Problem Solving: Appears intact Safety/Judgment: Appears intact Cognition Arousal/Alertness: Awake/alert Behavior During Therapy: WFL for tasks assessed/performed Overall Cognitive Status: Within Functional Limits for tasks assessed   Blood pressure (!) 105/52, pulse 72, temperature 98.3 F (36.8 C), temperature source Oral, resp. rate 18, height 5' 6" (1.676 m), weight 92.5 kg,  SpO2 97 %. Physical Exam Constitutional:      General: She is not in acute distress.    Appearance: She is obese.  HENT:     Head: Normocephalic and atraumatic.     Nose: Nose normal.     Mouth/Throat:     Mouth: Mucous membranes are moist.  Eyes:     Pupils: Pupils are equal, round, and reactive to light.  Cardiovascular:     Rate and Rhythm: Normal rate and regular rhythm.     Heart sounds: No murmur heard. No gallop.   Pulmonary:     Effort: Pulmonary effort is normal. No respiratory distress.     Breath sounds: No wheezing.  Abdominal:     General: There is no distension.     Palpations: Abdomen is soft.     Tenderness: There is no abdominal tenderness.  Musculoskeletal:        General: Swelling and deformity present.     Cervical back: Normal range of motion.     Right lower leg: Edema present.     Left lower leg: Edema present.  Skin:    Findings: Bruising and erythema present.  Neurological:     Comments: Alert and oriented x 3. Normal insight and awareness. Intact Memory. Normal language and speech. Cranial nerve exam unremarkable. RUE 5/5 prox to distal. LUE 4-/5 with limitations at elbow d/t pain. LUE 2/5 prox to 4/5 distally d/t pain. RLE 3+ prox to 4+/5 distally. No focal sensory findings.   Psychiatric:        Mood and Affect: Mood normal.        Behavior: Behavior normal.     Results for orders placed or performed during the hospital encounter of 12/15/20 (from the past 48 hour(s))  Heparin level (unfractionated)  Status: Abnormal   Collection Time: 12/19/20  3:14 AM  Result Value Ref Range   Heparin Unfractionated 0.26 (L) 0.30 - 0.70 IU/mL    Comment: (NOTE) If heparin results are below expected values, and patient dosage has  been confirmed, suggest follow up testing of antithrombin III levels. Performed at [facility], [place] [address], [place], [place] [address]   CBC     Status: Abnormal   Collection Time: 12/19/20  3:14 AM  Result Value  Ref Range   WBC 8.8 4.0 - 10.5 K/uL   RBC 3.18 (L) 3.87 - 5.11 MIL/uL   Hemoglobin 9.5 (L) 12.0 - 15.0 g/dL   HCT 30.1 (L) 36.0 - 46.0 %   MCV 94.7 80.0 - 100.0 fL   MCH 29.9 26.0 - 34.0 pg   MCHC 31.6 30.0 - 36.0 g/dL   RDW 15.0 11.5 - 15.5 %   Platelets 146 (L) 150 - 400 K/uL   nRBC 0.0 0.0 - 0.2 %    Comment: Performed at [facility], [place] [address] [place], [place] [address]  TSH     Status: None   Collection Time: 12/19/20  3:14 AM  Result Value Ref Range   TSH 1.587 0.350 - 4.500 uIU/mL    Comment: Performed by a 3rd Generation assay with a functional sensitivity of <=0.01 uIU/mL. Performed at [facility], [place] [address], [place], [place] [address]   Surgical PCR screen     Status: None   Collection Time: 12/19/20  3:16 AM   Specimen: Nasal Mucosa; Nasal Swab  Result Value Ref Range   MRSA, PCR NEGATIVE NEGATIVE   Staphylococcus aureus NEGATIVE NEGATIVE    Comment: Performed at [facility], [place] [address], [place], [place] [address]  Heparin level (unfractionated)     Status: Abnormal   Collection Time: 12/20/20  6:50 AM  Result Value Ref Range   Heparin Unfractionated 0.20 (L) 0.30 - 0.70 IU/mL    Comment: (NOTE) If heparin results are below expected values, and patient dosage has  been confirmed, suggest follow up testing of antithrombin III levels. Performed at [facility], [place] [address], [place], [place] [address]   CBC     Status: Abnormal   Collection Time: 12/20/20  6:50 AM  Result Value Ref Range   WBC 14.1 (H) 4.0 - 10.5 K/uL   RBC 2.74 (L) 3.87 - 5.11 MIL/uL   Hemoglobin 8.3 (L) 12.0 - 15.0 g/dL   HCT 25.7 (L) 36.0 - 46.0 %   MCV 93.8 80.0 - 100.0 fL   MCH 30.3 26.0 - 34.0 pg   MCHC 32.3 30.0 - 36.0 g/dL   RDW 15.0 11.5 - 15.5 %   Platelets 179 150 - 400 K/uL   nRBC 0.0 0.0 - 0.2 %    Comment: Performed at [facility], [place] [address] [place], [place] [address]  Basic metabolic panel     Status: Abnormal    Collection Time: 12/20/20  6:50 AM  Result Value Ref Range   Sodium 137 135 - 145 mmol/L   Potassium 4.8 3.5 - 5.1 mmol/L   Chloride 104 98 - 111 mmol/L   CO2 28 22 - 32 mmol/L   Glucose, Bld 142 (H) 70 - 99 mg/dL    Comment: Glucose reference range applies only to samples taken after fasting for at least 8 hours.   BUN 20 8 - 23 mg/dL   Creatinine,  Ser 0.84 0.44 - 1.00 mg/dL   Calcium 8.2 (L) 8.9 - 10.3 mg/dL   GFR, Estimated >60 >60 mL/min    Comment: (NOTE) Calculated using the CKD-EPI Creatinine Equation (2021)    Anion gap 5 5 - 15    Comment: Performed at [place] [address] [place], [place] [address]   *Note: Due to a large number of results and/or encounters for the requested time period, some results have not been displayed. A complete set of results can be found in Results Review.   MR HIP LEFT WO CONTRAST  Result Date: 12/19/2020 CLINICAL DATA:  Fall, left hip pain with large subcutaneous hematoma EXAM: MR OF THE LEFT HIP WITHOUT CONTRAST TECHNIQUE: Multiplanar, multisequence MR imaging was performed. No intravenous contrast was administered. COMPARISON:  CT and x-ray 12/15/2020 FINDINGS: Bones: No acute fracture. No dislocation. No femoral head avascular necrosis. No bone marrow edema. No marrow replacing bone lesion. Articular cartilage and labrum Articular cartilage: Mild chondral thinning of the bilateral hip joints without focal cartilage defect. No subchondral marrow signal changes. Labrum: Partial-thickness tear of the anterosuperior labrum (series 11, images 9-10). Joint or bursal effusion Joint effusion:  Trace bilateral joint effusions, nonspecific. Bursae: No abnormal bursal fluid collection. Muscles and tendons Muscles and tendons: Low-grade partial tears of the bilateral hamstring tendon origins. Remaining tendinous structures about the left hip are intact. Intramuscular edema within the distal left gluteus maximus, likely posttraumatic. Other findings  Miscellaneous: Large complex fluid collection within the subcutaneous soft tissues overlying the posterolateral aspect of the left hip with layering hematocrit levels and areas of T1 intermediate signal compatible with hematoma. The full extent of the collection was not entirely included within the field of view but measures at least 10 x 10 x 6 cm. Difficult to determine if the collection has significantly changed in size from the prior CT as the full extent of the collection was also not entirely included within the field of view. Extensive surrounding soft tissue edema. Colonic diverticulosis is noted. IMPRESSION: 1. Large hematoma within the subcutaneous soft tissues overlying the left hip measuring at least 10 cm in size. Difficult to determine if the collection has significantly changed in size from the prior CT as the full extent of the collection was also not entirely included within the field of view on either study. 2. No acute osseous abnormality of the left hip. 3. Mild left hip arthropathy with partial-thickness tear of the anterosuperior labrum. 4. Low-grade partial tears of the bilateral hamstring tendon origins. Electronically Signed   By: Davina Poke D.O.   On: 12/19/2020 11:01       Medical Problem List and Plan: 1.  Functional deficits secondary to left cornoid process fracture and major left hip hematoma which required I&D with vac therapy  -patient may not yet shower  -ELOS/Goals: 14 days, mod I to supervision with PT and OT 2.  Antithrombotics: -DVT/anticoagulation:  Pharmaceutical: Heparin-->question transition to Eliquis   -antiplatelet therapy: N/A 3. Pain Management: Hydrocodone prn 4. Mood: LCSW to follow for evaluation and support.   -antipsychotic agents:  5. Neuropsych: This patient is capable of making decisions on her own behalf. 6. Skin/Wound Care: Will monitor wound for healing. Add protein supplement to promote wound healing.   -VAC removed from wound  yesterday and wound closed by ortho   -continue local dressing   -ACE wrap removed as it creating a tourniquet effect on thigh, skin almost broken down in fact 7. Fluids/Electrolytes/Nutrition:  Monitor I/O. Check lytes in am 8. CAF: Monitor HR tid--continue amiodarone 9. Hypothyroid: On supplement 10. Orthostatic hypotension: Will continue to monitor orthostatic vitals   --Florinef increased to 0.2 mg bid on 04/01  -blood transfusion today, may need more than one unit  -difficult pt to apply TEDS/abd binder to given wounds and body habitus.  -encourage PO intake 11. H/o CVA with residual mild aphasia: On Crestor and Eliquis 12. OIC: Has not had BM since admission  --augment bowel regimen.   13. Acute blood loss anemia:  H/H trending down from 10.7-->7.2.  --Recheck CBC in am.   --Transfused with one unit on 04/01, may need an additional unit especially if she's a little dry 14. Pre-renal azotemia: BUN up to 25-->push po fluid intake 15. Leucocytosis: WBC has varied since surgery. Will monitor for fevers and other signs of infection.   --recheck white count in am.      Bary Leriche, PA-C 12/20/2020

## 2020-12-20 NOTE — Anesthesia Postprocedure Evaluation (Signed)
Anesthesia Post Note  Patient: Sherolyn Buba Stock  Procedure(s) Performed: IRRIGATION AND DEBRIDEMENT OF LEFT HIP HEMATOMA WITH APPLICATION OF WOUND VAC (Left Hip)     Patient location during evaluation: PACU Anesthesia Type: General Level of consciousness: awake and alert Pain management: pain level controlled Vital Signs Assessment: post-procedure vital signs reviewed and stable Respiratory status: spontaneous breathing, nonlabored ventilation, respiratory function stable and patient connected to nasal cannula oxygen Cardiovascular status: blood pressure returned to baseline and stable Postop Assessment: no apparent nausea or vomiting Anesthetic complications: no   No complications documented.  Last Vitals:  Vitals:   12/20/20 1200 12/20/20 1310  BP: (!) 105/52 (!) 117/53  Pulse:    Resp:    Temp:    SpO2:      Last Pain:  Vitals:   12/20/20 1053  TempSrc:   PainSc: 3                  Tamica Covell S

## 2020-12-20 NOTE — Progress Notes (Signed)
Occupational Therapy Treatment Patient Details Name: Kayla Harrison MRN: [id number] DOB: 03-May-1946 Today's Date: 12/20/2020    History of present illness Pt is a 75 y.o. female admitted on 12/15/20 presenting with a fall.  Patient has been very unsteady.  Multiple falls over the last several weeks.  Presents with painful hip, and non displaced fxs to L elbow, and  R ribs. Large hematoma over left hip that is now s/p hematoma evacuation on 12/19/20 with wound vac placement.  Syncopal event with PT on 12/20/20.  PMH: thyroid cancer/lung cancer/NHL with h/o SVC syndrome; CVA; pacemaker placement; afib on Eliquis; obesity s/p lap band surgery; hypothyroidism; HTN; and HLD.   OT comments  Pt received in recliner. Completed stand-pivot transfer with pt experiencing a near syncopal event while talking pivotal steps. +1 with transfers and pivotal steps but unable to progress further this session. Pt assisted to supine position to recover. D/c plan remains appropriate.    Follow Up Recommendations  CIR;Supervision - Intermittent    Equipment Recommendations  3 in 1 bedside commode    Recommendations for Other Services Rehab consult    Precautions / Restrictions Precautions Precautions: Fall Required Braces or Orthoses: Splint/Cast Splint/Cast: No longer has long arm gutter splint on LUE Silvestre Gunner, PA-C discontinued it 3/28) Restrictions Weight Bearing Restrictions: No LUE Weight Bearing: Weight bearing as tolerated       Mobility Bed Mobility Overal bed mobility: Needs Assistance Bed Mobility: Sit to Supine     Supine to sit: Min assist Sit to supine: Mod assist   General bed mobility comments: assist to advance BLE up and onto bed    Transfers Overall transfer level: Needs assistance Equipment used: Rolling walker (2 wheeled) Transfers: Sit to/from [employer] Transfers Sit to Stand: Min assist;From elevated surface Stand pivot transfers: Min assist        General transfer comment: min A to steady and light powerup. + dizziness while taking pivotal steps to EOB    Balance Overall balance assessment: Needs assistance Sitting-balance support: No upper extremity supported;Feet supported Sitting balance-Leahy Scale: Good     Standing balance support: Bilateral upper extremity supported;During functional activity Standing balance-Leahy Scale: Poor Standing balance comment: patient reliant on at least single UE support                           ADL either performed or assessed with clinical judgement   ADL Overall ADL's : Needs assistance/impaired                         Toilet Transfer: Minimal assistance;Stand-pivot Armed forces technical officer Details (indicate cue type and reason): recliner to EOB on her right           General ADL Comments: Pt completed SPT then immediately back to bed d/t near syncope during transfers     Vision       Perception     Praxis      Cognition Arousal/Alertness: Awake/alert Behavior During Therapy: WFL for tasks assessed/performed Overall Cognitive Status: Within Functional Limits for tasks assessed                                          Exercises     Shoulder Instructions       General Comments      Pertinent Vitals/  Pain       Pain Assessment: Faces Pain Score: 4  Faces Pain Scale: Hurts even more Pain Location: headache "just behind my eyes" Pain Descriptors / Indicators: Sore;Aching Pain Intervention(s): Monitored during session;Other (comment) (applied cool, wet cloth to forehead and eyes)  Home Living                                          Prior Functioning/Environment              Frequency  Min 2X/week        Progress Toward Goals  OT Goals(current goals can now be found in the care plan section)  Progress towards OT goals: Not progressing toward goals - comment (limited by near syncope taking pivotal steps  from recliner to EOB)  Acute Rehab OT Goals Patient Stated Goal: to return home, improve strength, stop falling OT Goal Formulation: With patient Time For Goal Achievement: 12/30/20 Potential to Achieve Goals: Good ADL Goals Pt Will Perform Grooming: with set-up;sitting;standing Pt Will Perform Lower Body Bathing: with set-up;sit to/from stand Pt Will Perform Lower Body Dressing: with set-up;sit to/from stand Pt Will Transfer to Toilet: with modified independence;ambulating Pt Will Perform Toileting - Clothing Manipulation and hygiene: with modified independence;sit to/from stand Pt/caregiver will Perform Home Exercise Program: Increased ROM;Left upper extremity;Independently;With theraputty;With written HEP provided  Plan Discharge plan needs to be updated    Co-evaluation                 AM-PAC OT "6 Clicks" Daily Activity     Outcome Measure   Help from another person eating meals?: None Help from another person taking care of personal grooming?: A Little Help from another person toileting, which includes using toliet, bedpan, or urinal?: A Lot Help from another person bathing (including washing, rinsing, drying)?: A Lot Help from another person to put on and taking off regular upper body clothing?: A Lot Help from another person to put on and taking off regular lower body clothing?: A Lot 6 Click Score: 15    End of Session Equipment Utilized During Treatment: Rolling walker  OT Visit Diagnosis: Unsteadiness on feet (R26.81);Muscle weakness (generalized) (M62.81);History of falling (Z91.81);Pain;Dizziness and giddiness (R42)   Activity Tolerance Other (comment) (near syncope during pivotal steps recliner to EOB.)   Patient Left in bed;with call bell/phone within reach;with bed alarm set   Nurse Communication Other (comment) (near syncope taking pivotal steps. headache.)        Time: 6440-3474 OT Time Calculation (min): 22 min  Charges: OT General Charges $OT  Visit: 1 Visit OT Treatments $Self Care/Home Management : 8-22 mins  Tyrone Schimke, OT Acute Rehabilitation Services Pager: [phone] Office: [phone]    Hortencia Pilar 12/20/2020, 1:39 PM

## 2020-12-20 NOTE — Progress Notes (Signed)
Inpatient Rehabilitation Admissions Coordinator  I await patient's ability to tolerate the intensity of therapy required of CIR admit. Noted near syncopal episodes today.  I will follow.  Danne Baxter, RN, MSN Rehab Admissions Coordinator [phone] 12/20/2020 4:08 PM

## 2020-12-20 NOTE — Progress Notes (Signed)
ANTICOAGULATION CONSULT NOTE - Follow Up Consult  Pharmacy Consult for Heparin Indication: atrial fibrillation  Allergies  Allergen Reactions  . Dofetilide Other (See Comments)    Prolonged QT interval   . Ranolazine     Balance issues  . Rivaroxaban Other (See Comments)    Nose Bleed X 6 hrs ; packing in ER Nasal hemorrhage  . Rivaroxaban     Nose Bleed X 6 hrs ; packing in ER Nasal hemorrhage  . Tape Other (See Comments)    SKIN IS THIN AND TEARS EASILY   . Tikosyn [Dofetilide] Other (See Comments)    "Long QT wave"   . Epinephrine Other (See Comments)    Oral anesthetic Dental form only (liquid). Patient stated she will become "out of it" she can hear you but cannot respond in normal fashion. "not fully with it"  . Epinephrine   . Ketamine Other (See Comments)    Hallucinations   . Adhesive [Tape] Rash    Paper tape as well  . Ketamine Other (See Comments)    HALLUCINATIONS    Patient Measurements: Height: 5\' 6"  (167.6 cm) Weight: 92.5 kg (204 lb) IBW/kg (Calculated) : 59.3 Heparin Dosing Weight:    Vital Signs: Temp: 98.3 F (36.8 C) (03/30 0814) Temp Source: Oral (03/30 0814) BP: 101/47 (03/30 0814) Pulse Rate: 70 (03/30 0814)  Labs: Recent Labs    12/17/20 1914 12/18/20 0756 12/18/20 0756 12/19/20 0314 12/20/20 0650  HGB  --  9.7*   < > 9.5* 8.3*  HCT  --  29.7*  --  30.1* 25.7*  PLT  --  151  --  146* 179  APTT 82* 78*  --   --   --   HEPARINUNFRC  --  0.34  --  0.26* 0.20*  CREATININE  --  0.91  --   --  0.84   < > = values in this interval not displayed.    Estimated Creatinine Clearance: 65.3 mL/min (by C-G formula based on SCr of 0.84 mg/dL).   Assessment: Anticoag:  s/p fall, on Eliquis PTA for afib with last dose taken 3/24 @1900 .  CT head negative, elbow/rib fx, large hip hematoma, CHADS2VASC 7. - Will use low end goals and will not bolus. - 3/29: Approximately 500 cc of hematoma fluid was evacuated. - Hep level 0.2 lower today.  Hgb 13.4 baseline>8.3. Plts 179  Goal of Therapy:  Heparin level 0.3-0.5 units/ml Monitor platelets by anticoagulation protocol: Yes   Plan:  Increase heparin slightly to 1000 units/hr Recheck in 6 hrs. Daily HL and CBC  Do not resume oral anticoagulation until hematoma treatment plans delineated and cleared by surgery  Kamauri Denardo S. Alford Highland, PharmD, BCPS Clinical Staff Pharmacist [email or web address] Alford Highland, [employer] 12/20/2020,10:16 AM

## 2020-12-20 NOTE — Progress Notes (Addendum)
Patient Name: Kayla Harrison      SUBJECTIVE: Admitted with a fall fracture of her elbow, ribs.  Also bruised hip Orthostatic vital signs showed profound decrease in blood pressure 110--80 going from lying to sitting.  Unable to stand.   HIP MRI neg>> drainage 3/29 wand wound VAC -- Less pain No dyspnea  Past Medical History:  Diagnosis Date  . Anemia   . Arthritis    osteoarthritis - knees and right shoulder  . Blood transfusion without reported diagnosis   . Breast cancer ([place])    Dr Margot Chimes, total thyroidectomy- 1999- for cancer  . 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 Chronic bilateral pleural effusions   . Colon polyp    Dr Earlean Shawl  . Complication of anesthesia    Ketamine produces LSD reaction, bright colored nightmarish experience   . Dyslipidemia   . Endometriosis   . Fibroids   . H/O pleural effusion    s/p thoracentesis w 3260ml withdrawn  . Hepatitis    [place] ?hepatitis   . History of dysphagia    due to radiation therapy  . History of hiatal hernia    small noted on PET scan  . Hypertension   . Hypothyroidism   . Lung cancer, lower lobe ([place]) 01/2017   radiation RX completed 03/04/17; will start chemo 6/27, pt unaware of lung cancer  . Morbid obesity ([place])    Status post lap band surgery  . Nephrolithiasis   . Non Hodgkin's lymphoma ([place])    on chemotherapy  . Persistent atrial fibrillation ([place])    a. s/p PVI 2008 b. s/p convergent ablation 8119 complicated by bradycardia requiring pacemaker implant  . Personal history of radiation therapy   . Presence of permanent cardiac pacemaker   . Rotator cuff tear    Right  . Stroke [facility])    2003- [place] x2  . SVC syndrome    with lung mass and non hodgkins lymphoma  . Thyroid cancer ([place]) 2000    Scheduled Meds:  Scheduled Meds: . amiodarone  200 mg Oral Daily  . docusate sodium  100 mg Oral BID  . fludrocortisone  0.1 mg Oral Daily  . levothyroxine  137 mcg Oral  Once per day on Sun Mon Tue Wed Thu Fri  . [START ON 12/23/2020] levothyroxine  68.5 mcg Oral Once per day on Sat  . lidocaine  2 patch Transdermal Q24H  . multivitamin with minerals  1 tablet Oral Daily  . mupirocin ointment  1 application Nasal BID  . rosuvastatin  10 mg Oral Once per day on Mon Thu   Continuous Infusions: .  ceFAZolin (ANCEF) IV Stopped (12/20/20 0202)  . heparin Stopped (12/19/20 1632)   acetaminophen **OR** acetaminophen, bisacodyl, HYDROcodone-acetaminophen, ondansetron **OR** ondansetron (ZOFRAN) IV, polyethylene glycol, senna    PHYSICAL EXAM Vitals:   12/19/20 2007 12/19/20 2330 12/20/20 0550 12/20/20 0814  BP: (!) 129/59 115/62 (!) 98/51 (!) 101/47  Pulse: 69 62 69 70  Resp: 16 16 18 18   Temp: 98.1 F (36.7 C) 97.9 F (36.6 C) 98.1 F (36.7 C) 98.3 F (36.8 C)  TempSrc: Oral Oral Oral Oral  SpO2: 98% 98% 96% 98%  Weight:      Height:       Well developed and nourished in no acute distress HENT normal Neck supple  Clear-laterally  Regular rate and rhythm, no murmurs or gallops Abd-soft with active BS No Clubbing cyanosis  edema Skin-warm and dry A & Oriented  Grossly normal sensory and motor function         Tel A pacing    Intake/Output Summary (Last 24 hours) at 12/20/2020 0819 Last data filed at 12/20/2020 0817 Gross per 24 hour  Intake 95.33 ml  Output 1100 ml  Net -1004.67 ml    LABS: Basic Metabolic Panel: Recent Labs  Lab 12/15/20 0559 12/16/20 0251 12/17/20 0122 12/18/20 0756  NA 134* 135 136 136  K 4.7 4.5 4.5 4.4  CL 101 100 103 103  CO2 24 27 28 28   GLUCOSE 104* 94 118* 109*  BUN 21 19 25* 20  CREATININE 0.92 0.99 1.03* 0.91  CALCIUM 8.6* 8.5* 8.3* 8.3*   Cardiac Enzymes: No results for input(s): CKTOTAL, CKMB, CKMBINDEX, TROPONINI in the last 72 hours. CBC: Recent Labs  Lab 12/15/20 0559 12/16/20 0251 12/17/20 0122 12/18/20 0756 12/19/20 0314  WBC 8.3 6.3 8.0 8.8 8.8  NEUTROABS 6.2  --   --   --   --    HGB 13.4 12.1 10.7* 9.7* 9.5*  HCT 41.5 38.5 32.6* 29.7* 30.1*  MCV 94.3 94.1 91.3 92.2 94.7  PLT 171 173 172 151 146*   Thyroid Function Tests: Recent Labs    12/19/20 0314  TSH 1.587   Anemia Panel: No results for input(s): VITAMINB12, FOLATE, FERRITIN, TIBC, IRON, RETICCTPCT in the last 72 hours.   Device Interrogation:* pending    ASSESSMENT AND PLAN:  Atrial fibrillation-persistent  Amiodarone therapy-longstanding  Sinus node dysfunction status post pacemaker  Orthostatic hypotension-profound  Falls-recurrent  Anemia   Hypotension even supine  Will increase fludrocortisone >>0.1 bid   Reverse trendelenburg  oob chair   Will begin iron replacement feraheme  WOULD TRANSITION BACK TO [place]  WHEN OK PER SURGERY   Signed, [name] MD  12/20/2020

## 2020-12-20 NOTE — Progress Notes (Signed)
Physical Therapy Treatment Patient Details Name: Kayla Harrison MRN: [id number] DOB: [birth year] Today's Date: 12/20/2020    History of Present Illness Pt is a 75 y.o. female admitted on 12/15/20 presenting with a fall.  Patient has been very unsteady.  Multiple falls over the last several weeks.  Presents with painful hip, and non displaced fxs to L elbow, and  R ribs. Large hematoma over left hip that is now s/p hematoma evacuation on 12/19/20 with wound vac placement.  Syncopal event with PT on 12/20/20.  PMH: thyroid cancer/lung cancer/NHL with h/o SVC syndrome; CVA; pacemaker placement; afib on Eliquis; obesity s/p lap band surgery; hypothyroidism; HTN; and HLD.    PT Comments    Pt supine in bed and eager to mobilize this session.  Pt moving quite well with no complaints of dizziness in sitting or standing.  PTA then progressed to gt training and after 6 ft of ambulation patient requested to sit and has + syncopal event.  PTA assisted to sitting on edge of bed and called for help.  BP 106/66.  Pt came within 3-5 seconds.  RN at bedside for assessment.  Plan for update in recommendation at this time to CIR where she can be medically managed during her recovery.  Pt remains a high fall risk at this time.   Plan for orthostatic vital assessment next session and close chair follow for safety.    Follow Up Recommendations  CIR;Supervision/Assistance - 24 hour     Equipment Recommendations  None recommended by PT    Recommendations for Other Services       Precautions / Restrictions Precautions Precautions: Fall Required Braces or Orthoses: Splint/Cast Splint/Cast: No longer has long arm gutter splint on LUE Silvestre Gunner, PA-C discontinued it 3/28) Restrictions Weight Bearing Restrictions: No    Mobility  Bed Mobility Overal bed mobility: Needs Assistance Bed Mobility: Supine to Sit;Sit to Supine     Supine to sit: Min assist Sit to supine: Mod assist   General bed  mobility comments: Min assistance to rise into sitting and advance B LEs to edge of bed.  Pt with use of holding to PTA hands this session to pull trunk upright.  Pt sat edge of bed extended time and denies dizziness.    Transfers Overall transfer level: Needs assistance Equipment used: Rolling walker (2 wheeled) Transfers: Sit to/from Stand Sit to Stand: Min assist;From elevated surface         General transfer comment: Cues for hand placement to and from seated surface.  Pt denies dizziness in standing.  Ambulation/Gait Ambulation/Gait assistance: Min assist;Mod assist Gait Distance (Feet): 6 Feet Assistive device: Rolling walker (2 wheeled) Gait Pattern/deviations: Step-to pattern;Trunk flexed;Decreased stride length     General Gait Details: Pt performed short bout of gt training before requesting to sit and become non verbal with +syncopal episode.  PTA assisted patient to seated surface with mod-max assistance at edge of bed.  BP 106/66 after resting in modified supine position with trunk down and feet in dependent position.  Pt came to and PTA assisted from edge of bed to recliner.   Stairs             Wheelchair Mobility    Modified Rankin (Stroke Patients Only)       Balance Overall balance assessment: Needs assistance Sitting-balance support: No upper extremity supported;Feet supported Sitting balance-Leahy Scale: Good       Standing balance-Leahy Scale: Poor  Cognition Arousal/Alertness: Awake/alert Behavior During Therapy: WFL for tasks assessed/performed Overall Cognitive Status: Within Functional Limits for tasks assessed                                        Exercises      General Comments        Pertinent Vitals/Pain Pain Assessment: Faces Pain Score: 4  Pain Location: L hip Pain Descriptors / Indicators: Discomfort;Sore Pain Intervention(s): Monitored during session    Home  Living                      Prior Function            PT Goals (current goals can now be found in the care plan section) Acute Rehab PT Goals Patient Stated Goal: to return home, improve strength, stop falling Potential to Achieve Goals: Fair Progress towards PT goals: Progressing toward goals    Frequency    Min 4X/week      PT Plan Discharge plan needs to be updated    Co-evaluation              AM-PAC PT "6 Clicks" Mobility   Outcome Measure  Help needed turning from your back to your side while in a flat bed without using bedrails?: A Little Help needed moving from lying on your back to sitting on the side of a flat bed without using bedrails?: A Little Help needed moving to and from a bed to a chair (including a wheelchair)?: A Lot Help needed standing up from a chair using your arms (e.g., wheelchair or bedside chair)?: A Lot Help needed to walk in hospital room?: A Lot Help needed climbing 3-5 steps with a railing? : Total 6 Click Score: 13    End of Session Equipment Utilized During Treatment: Gait belt Activity Tolerance: Patient limited by fatigue Patient left: in chair;with call bell/phone within reach Nurse Communication: Mobility status PT Visit Diagnosis: Unsteadiness on feet (R26.81);Other abnormalities of gait and mobility (R26.89);Repeated falls (R29.6);Muscle weakness (generalized) (M62.81);Difficulty in walking, not elsewhere classified (R26.2);Pain Pain - Right/Left: Left Pain - part of body: Hip     Time: 6578-4696 PT Time Calculation (min) (ACUTE ONLY): 19 min  Charges:  $Therapeutic Activity: 8-22 mins                     Erasmo Leventhal , PTA Acute Rehabilitation Services Pager [phone] Office [phone]     Bennie Scaff Eli Hose 12/20/2020, 12:34 PM

## 2020-12-20 NOTE — Progress Notes (Signed)
Inpatient Rehabilitation Admissions Coordinator  Inpatient rehab consult received. I met at bedside with patient and her sister, Santiago Glad. I discussed goals and expectations of a possible CIR admit. They prefer CIR, not SNF. I will discuss with Rehab MD and follow up tomorrow. Patient seems to be a good candidate for CIR admit.  Danne Baxter, RN, MSN Rehab Admissions Coordinator [phone] 12/20/2020 11:03 AM

## 2020-12-20 NOTE — Plan of Care (Signed)
  Problem: Education: Goal: Knowledge of General Education information will improve Description: Including pain rating scale, medication(s)/side effects and non-pharmacologic comfort measures Outcome: Progressing   Problem: Clinical Measurements: Goal: Respiratory complications will improve Outcome: Progressing   Problem: Activity: Goal: Risk for activity intolerance will decrease Outcome: Progressing   Problem: Nutrition: Goal: Adequate nutrition will be maintained Outcome: Progressing   Problem: Coping: Goal: Level of anxiety will decrease Outcome: Progressing   Problem: Elimination: Goal: Will not experience complications related to urinary retention Outcome: Progressing   Problem: Pain Managment: Goal: General experience of comfort will improve Outcome: Progressing   Problem: Safety: Goal: Ability to remain free from injury will improve Outcome: Progressing

## 2020-12-20 NOTE — Progress Notes (Addendum)
PROGRESS NOTE  Kayla Harrison MVH:[id number] DOB: 09-04-1946 DOA: 12/15/2020 PCP: [name], MD   LOS: 5 days   Brief narrative: This is 75 years old female with past medical history of thyroid cancer lung cancer non-Hodgkin's lymphoma, CVA, history of atrial fibrillation on Eliquis, obesity status post lap band surgery, hypothyroidism, hyperlipidemia and hypertension presented to hospital after sustaining a fall which was thought to be orthostatic in nature.  This was followed by a large hematoma on the left hip.  Patient underwent evacuation of the hematoma on 12/19/2020 with improvement in her symptoms.  Assessment/Plan:  Principal Problem:   Fall Active Problems:   HYPERLIPIDEMIA   Hypothyroidism   Non-Hodgkin lymphoma, unspecified, intrathoracic lymph nodes (HCC)   Persistent atrial fibrillation (HCC)   Pacemaker   Hip hematoma, left, initial encounter   Nondisplaced fracture of coronoid process of left ulna, initial encounter for closed fracture   Multiple rib fractures   Orthostatic hypotension   History of CVA (cerebrovascular accident)  Falls suspected due to orthostatic hypotension Patient with history of recurrent falls.  Patient follows up with Dr. Caryl Comes cardiology as outpatient.  Fall is appreciated by giving up losing balance feeling.  Cardiology has consulted in the past and recommended TED hose Florinef and abdominal binder.  Physical therapy was consulted for ambulation but patient nearly had a syncopal episode.  Florinef has been increased to 0.1 twice daily.  Rehabilitation has been consulted.  Persistent atrial fibrillation. Underwent large hematoma evacuation.  Was put on heparin drip due to significant risk from her previous history.  We will continue to monitor hemoglobin.  On amiodarone.  History of pacemaker.  Hold Eliquis given large hematoma.  Left hip hematoma. Post hematoma evacuation.  L elbow fracture Status post splint with outpatient plan  for Ortho follow-up.  Possible R rib fractures -Xray with lateral R 7/8 rib fractures which were not seen on CT. We will continue lidocaine patch  Hyperlipidemia Continue statins  Hypothyroidism TSH within normal limits.  Continue Synthroid  Class 1 obesity -Body mass index is 32.93 kg/m. Patient is status post laparoscopic banding surgery.  History of non-Hodgkin's lymphoma Seen by Dr. Lindi Adie as outpatient.  Annual follow-up.  History of lung mass thyroid cancer.  DVT prophylaxis: Place and maintain sequential compression device Start: 12/18/20 1251 Place TED hose Start: 12/18/20 0934 Place TED hose Start: 12/17/20 8413   Code Status: Full code  Family Communication: None today  Status is: Inpatient  Remains inpatient appropriate because:IV treatments appropriate due to intensity of illness or inability to take PO and Inpatient level of care appropriate due to severity of illness   Dispo: The patient is from: Home, lives alone              Anticipated d/c is to: CIR              Patient currently is not medically stable to d/c.   Difficult to place patient No  Consultants:  Orthopedics  Cardiology  Procedures: Evacuation of the hematoma on 12/19/2020  Anti-infectives:  . None  Anti-infectives (From admission, onward)   Start     Dose/Rate Route Frequency Ordered Stop   12/20/20 0000  ceFAZolin (ANCEF) IVPB 1 g/50 mL premix        1 g 100 mL/hr over 30 Minutes Intravenous Every 8 hours 12/19/20 2155 12/20/20 2359   12/19/20 1015  ceFAZolin (ANCEF) IVPB 2g/100 mL premix        2 g 200 mL/hr over  30 Minutes Intravenous On call to O.R. 12/19/20 0920 12/19/20 1738     Subjective: Today, patient was seen and examined at bedside.  Patient complains of feeling little better with her thigh and has less pain.  Denies any shortness of breath, cough, fever or chills.  Wishes to sit up on the bed with physical therapy.  Objective: Vitals:   12/20/20 0814  12/20/20 1115  BP: (!) 101/47 106/66  Pulse: 70 72  Resp: 18   Temp: 98.3 F (36.8 C)   SpO2: 98% 97%    Intake/Output Summary (Last 24 hours) at 12/20/2020 1146 Last data filed at 12/20/2020 1015 Gross per 24 hour  Intake 150.39 ml  Output 600 ml  Net -449.61 ml   Filed Weights   12/15/20 0552 12/15/20 1257  Weight: 94 kg 92.5 kg   Body mass index is 32.93 kg/m.   Physical Exam:  GENERAL: Patient is alert awake and oriented. Not in obvious distress.  Obese built HENT: No scleral pallor or icterus. Pupils equally reactive to light. Oral mucosa is moist NECK: is supple, no gross swelling noted. CHEST: Clear to auscultation. No crackles or wheezes.  Diminished breath sounds bilaterally. CVS: S1 and S2 heard, no murmur. Regular rate and rhythm.  ABDOMEN: Soft, non-tender, bowel sounds are present.  External urinary catheter in place. EXTREMITIES: No edema.  Large hematoma of the left lower extremity status post evacuation with large dressing and  significant ecchymosis.  Wound VAC in place. CNS: Cranial nerves are intact. No focal motor deficits. SKIN: warm and dry left lower extremity ecchymosis, right arm ecchymosis  Data Review: I have personally reviewed the following laboratory data and studies,  CBC: Recent Labs  Lab 12/15/20 0559 12/16/20 0251 12/17/20 0122 12/18/20 0756 12/19/20 0314 12/20/20 0650  WBC 8.3 6.3 8.0 8.8 8.8 14.1*  NEUTROABS 6.2  --   --   --   --   --   HGB 13.4 12.1 10.7* 9.7* 9.5* 8.3*  HCT 41.5 38.5 32.6* 29.7* 30.1* 25.7*  MCV 94.3 94.1 91.3 92.2 94.7 93.8  PLT 171 173 172 151 146* 007   Basic Metabolic Panel: Recent Labs  Lab 12/15/20 0559 12/16/20 0251 12/17/20 0122 12/18/20 0756 12/20/20 0650  NA 134* 135 136 136 137  K 4.7 4.5 4.5 4.4 4.8  CL 101 100 103 103 104  CO2 24 27 28 28 28   GLUCOSE 104* 94 118* 109* 142*  BUN 21 19 25* 20 20  CREATININE 0.92 0.99 1.03* 0.91 0.84  CALCIUM 8.6* 8.5* 8.3* 8.3* 8.2*   Liver Function  Tests: No results for input(s): AST, ALT, ALKPHOS, BILITOT, PROT, ALBUMIN in the last 168 hours. No results for input(s): LIPASE, AMYLASE in the last 168 hours. No results for input(s): AMMONIA in the last 168 hours. Cardiac Enzymes: No results for input(s): CKTOTAL, CKMB, CKMBINDEX, TROPONINI in the last 168 hours. BNP (last 3 results) No results for input(s): BNP in the last 8760 hours.  ProBNP (last 3 results) No results for input(s): PROBNP in the last 8760 hours.  CBG: No results for input(s): GLUCAP in the last 168 hours. Recent Results (from the past 240 hour(s))  Resp Panel by RT-PCR (Flu A&B, Covid) Nasopharyngeal Swab     Status: None   Collection Time: 12/15/20  8:44 AM   Specimen: Nasopharyngeal Swab; Nasopharyngeal(NP) swabs in vial transport medium  Result Value Ref Range Status   SARS Coronavirus 2 by RT PCR NEGATIVE NEGATIVE Final    Comment: (  NOTE) SARS-CoV-2 target nucleic acids are NOT DETECTED.  The SARS-CoV-2 RNA is generally detectable in upper respiratory specimens during the acute phase of infection. The lowest concentration of SARS-CoV-2 viral copies this assay can detect is 138 copies/mL. A negative result does not preclude SARS-Cov-2 infection and should not be used as the sole basis for treatment or other patient management decisions. A negative result may occur with  improper specimen collection/handling, submission of specimen other than nasopharyngeal swab, presence of viral mutation(s) within the areas targeted by this assay, and inadequate number of viral copies(<138 copies/mL). A negative result must be combined with clinical observations, patient history, and epidemiological information. The expected result is Negative.  Fact Sheet for Patients:  EntrepreneurPulse.com.au  Fact Sheet for Healthcare Providers:  IncredibleEmployment.be  This test is no t yet approved or cleared by the [place] FDA and   has been authorized for detection and/or diagnosis of SARS-CoV-2 by FDA under an Emergency Use Authorization (EUA). This EUA will remain  in effect (meaning this test can be used) for the duration of the COVID-19 declaration under Section 564(b)(1) of the Act, 21 U.S.C.section 360bbb-3(b)(1), unless the authorization is terminated  or revoked sooner.       Influenza A by PCR NEGATIVE NEGATIVE Final   Influenza B by PCR NEGATIVE NEGATIVE Final    Comment: (NOTE) The Xpert Xpress SARS-CoV-2/FLU/RSV plus assay is intended as an aid in the diagnosis of influenza from Nasopharyngeal swab specimens and should not be used as a sole basis for treatment. Nasal washings and aspirates are unacceptable for Xpert Xpress SARS-CoV-2/FLU/RSV testing.  Fact Sheet for Patients: EntrepreneurPulse.com.au  Fact Sheet for Healthcare Providers: IncredibleEmployment.be  This test is not yet approved or cleared by the [place] FDA and has been authorized for detection and/or diagnosis of SARS-CoV-2 by FDA under an Emergency Use Authorization (EUA). This EUA will remain in effect (meaning this test can be used) for the duration of the COVID-19 declaration under Section 564(b)(1) of the Act, 21 U.S.C. section 360bbb-3(b)(1), unless the authorization is terminated or revoked.  Performed at [facility], [place] [address], [place], [place] [address]   Surgical PCR screen     Status: None   Collection Time: 12/19/20  3:16 AM   Specimen: Nasal Mucosa; Nasal Swab  Result Value Ref Range Status   MRSA, PCR NEGATIVE NEGATIVE Final   Staphylococcus aureus NEGATIVE NEGATIVE Final    Comment: Performed at [facility], [place] [address], [place], [place] [address]     Studies: MR HIP LEFT WO CONTRAST  Result Date: 12/19/2020 CLINICAL DATA:  Fall, left hip pain with large subcutaneous hematoma EXAM: MR OF THE LEFT HIP WITHOUT CONTRAST TECHNIQUE: Multiplanar,  multisequence MR imaging was performed. No intravenous contrast was administered. COMPARISON:  CT and x-ray 12/15/2020 FINDINGS: Bones: No acute fracture. No dislocation. No femoral head avascular necrosis. No bone marrow edema. No marrow replacing bone lesion. Articular cartilage and labrum Articular cartilage: Mild chondral thinning of the bilateral hip joints without focal cartilage defect. No subchondral marrow signal changes. Labrum: Partial-thickness tear of the anterosuperior labrum (series 11, images 9-10). Joint or bursal effusion Joint effusion:  Trace bilateral joint effusions, nonspecific. Bursae: No abnormal bursal fluid collection. Muscles and tendons Muscles and tendons: Low-grade partial tears of the bilateral hamstring tendon origins. Remaining tendinous structures about the left hip are intact. Intramuscular edema within the distal left gluteus maximus, likely posttraumatic. Other findings Miscellaneous: Large complex fluid collection within the  subcutaneous soft tissues overlying the posterolateral aspect of the left hip with layering hematocrit levels and areas of T1 intermediate signal compatible with hematoma. The full extent of the collection was not entirely included within the field of view but measures at least 10 x 10 x 6 cm. Difficult to determine if the collection has significantly changed in size from the prior CT as the full extent of the collection was also not entirely included within the field of view. Extensive surrounding soft tissue edema. Colonic diverticulosis is noted. IMPRESSION: 1. Large hematoma within the subcutaneous soft tissues overlying the left hip measuring at least 10 cm in size. Difficult to determine if the collection has significantly changed in size from the prior CT as the full extent of the collection was also not entirely included within the field of view on either study. 2. No acute osseous abnormality of the left hip. 3. Mild left hip arthropathy with  partial-thickness tear of the anterosuperior labrum. 4. Low-grade partial tears of the bilateral hamstring tendon origins. Electronically Signed   By: Davina Poke D.O.   On: 12/19/2020 11:01      Flora Lipps, MD  [facility] 12/20/2020  If 7PM-7AM, please contact night-coverage

## 2020-12-20 NOTE — Progress Notes (Signed)
     Kayla Harrison is a 75 y.o. female   Orthopaedic diagnosis:left lateral thigh and hip deep hematoma  Subjective: Patient is more comfortable today.  She says her pain is much better since the surgery.  She has been working with physical therapy but did have some orthostasis.  She is planning to discharge to rehab.  Objectyive: Vitals:   12/20/20 0814 12/20/20 1115  BP: (!) 101/47 106/66  Pulse: 70 72  Resp: 18   Temp: 98.3 F (36.8 C)   SpO2: 98% 97%     Exam: Awake and alert Respirations even and unlabored No acute distress  Left lateral thigh with decompressed hematoma.  VAC in place and is suctioning well with good seal.  Minimal tenderness to palpation along the lateral thigh.  Significant ecchymosis.  Assessment: Status post left lateral thigh hematoma decompression and VAC placement, doing well   Plan: We will continue VAC and monitor output.  She is doing much better.  We did discuss the possibility of skin necrosis given the amount of tension on her skin during the hematoma phase.  Hopefully this will heal well.  She has had relief in her discomfort after the decompression which is encouraging.  If the VAC output decreases significantly we will plan to DC the [facility] and close the wound at the bedside.  Have ordered for VAC changes by wound care.  I will also write to have them attempt to put a pressure dressing around her thigh possibly Ace wrap versus compression of shortness.  Okay to discharge from orthopedic standpoint when appropriate medically.   Radene Journey, MD

## 2020-12-21 LAB — COMPREHENSIVE METABOLIC PANEL
ALT: 18 U/L (ref 0–44)
AST: 54 U/L — ABNORMAL HIGH (ref 15–41)
Albumin: 2.1 g/dL — ABNORMAL LOW (ref 3.5–5.0)
Alkaline Phosphatase: 50 U/L (ref 38–126)
Anion gap: 5 (ref 5–15)
BUN: 25 mg/dL — ABNORMAL HIGH (ref 8–23)
CO2: 28 mmol/L (ref 22–32)
Calcium: 8 mg/dL — ABNORMAL LOW (ref 8.9–10.3)
Chloride: 105 mmol/L (ref 98–111)
Creatinine, Ser: 1.06 mg/dL — ABNORMAL HIGH (ref 0.44–1.00)
GFR, Estimated: 54 mL/min — ABNORMAL LOW (ref >60)
Glucose, Bld: 114 mg/dL — ABNORMAL HIGH (ref 70–99)
Potassium: 4.5 mmol/L (ref 3.5–5.1)
Sodium: 138 mmol/L (ref 135–145)
Total Bilirubin: 1 mg/dL (ref 0.3–1.2)
Total Protein: 4.8 g/dL — ABNORMAL LOW (ref 6.5–8.1)

## 2020-12-21 LAB — PHOSPHORUS: Phosphorus: 3 mg/dL (ref 2.5–4.6)

## 2020-12-21 LAB — CBC
HCT: 23.6 % — ABNORMAL LOW (ref 36.0–46.0)
Hemoglobin: 7.4 g/dL — ABNORMAL LOW (ref 12.0–15.0)
MCH: 31 pg (ref 26.0–34.0)
MCHC: 31.4 g/dL (ref 30.0–36.0)
MCV: 98.7 fL (ref 80.0–100.0)
Platelets: 184 10*3/uL (ref 150–400)
RBC: 2.39 MIL/uL — ABNORMAL LOW (ref 3.87–5.11)
RDW: 15.3 % (ref 11.5–15.5)
WBC: 10.3 10*3/uL (ref 4.0–10.5)
nRBC: 0 % (ref 0.0–0.2)

## 2020-12-21 LAB — HEPARIN LEVEL (UNFRACTIONATED)
Heparin Unfractionated: 0.37 IU/mL (ref 0.30–0.70)
Heparin Unfractionated: 0.2 IU/mL — ABNORMAL LOW (ref 0.30–0.70)
Heparin Unfractionated: 0.48 IU/mL (ref 0.30–0.70)

## 2020-12-21 LAB — MAGNESIUM: Magnesium: 1.9 mg/dL (ref 1.7–2.4)

## 2020-12-21 MED ORDER — LIDOCAINE HCL (PF) 1 % IJ SOLN: 5.0000 mL | Freq: Once | INTRAMUSCULAR | Status: DC

## 2020-12-21 MED ORDER — LIDOCAINE HCL (PF) 1 % IJ SOLN
5.0000 mL | Freq: Once | INTRAMUSCULAR | Status: AC
  Administered 2020-12-21: 5 mL
  Filled 2020-12-21 (×2): qty 5

## 2020-12-21 MED ORDER — MAGNESIUM CITRATE PO SOLN
1.0000 | Freq: Once | ORAL | Status: DC
  Filled 2020-12-21: qty 296

## 2020-12-21 MED ORDER — ENSURE ENLIVE PO LIQD: 237.0000 mL | Freq: Three times a day (TID) | ORAL | Status: DC

## 2020-12-21 MED ORDER — MORPHINE SULFATE (PF) 2 MG/ML IV SOLN
2.0000 mg | INTRAVENOUS | Status: DC | PRN
  Administered 2020-12-21: 2 mg via INTRAVENOUS
  Filled 2020-12-21: qty 1

## 2020-12-21 MED ORDER — FENTANYL CITRATE (PF) 100 MCG/2ML IJ SOLN
25.0000 ug | INTRAMUSCULAR | Status: DC | PRN
  Administered 2020-12-21: 50 ug via INTRAVENOUS
  Filled 2020-12-21 (×2): qty 2

## 2020-12-21 NOTE — Consult Note (Signed)
WOC Nurse Consult Note: Reason for Consult: NPWT dressing change to left thigh hematoma site.  bedside): I spoke with Dr. Gwenlyn Saran and explained that a member of the surgical team must be present at the first [facility] dressing surgical change to address any bleeding issues that may occur.  Also, that the [facility] nurses are not available for [facility] dressing changes on Saturdays.  He has agreed to meet at noon tomorrow at the patient bedside for the [facility] change. Orders have been modified to reflect that. Val Riles, RN, MSN, CWOCN, CNS-BC, pager [phone]

## 2020-12-21 NOTE — Progress Notes (Signed)
ANTICOAGULATION CONSULT NOTE - Follow Up Consult  Pharmacy Consult for IV Heparin Indication: atrial fibrillation  Allergies  Allergen Reactions  . Dofetilide Other (See Comments)    Prolonged QT interval   . Ranolazine     Balance issues  . Rivaroxaban Other (See Comments)    Nose Bleed X 6 hrs ; packing in ER Nasal hemorrhage  . Rivaroxaban     Nose Bleed X 6 hrs ; packing in ER Nasal hemorrhage  . Tape Other (See Comments)    SKIN IS THIN AND TEARS EASILY   . Tikosyn [Dofetilide] Other (See Comments)    "Long QT wave"   . Epinephrine Other (See Comments)    Oral anesthetic Dental form only (liquid). Patient stated she will become "out of it" she can hear you but cannot respond in normal fashion. "not fully with it"  . Epinephrine   . Ketamine Other (See Comments)    Hallucinations   . Adhesive [Tape] Rash    Paper tape as well  . Ketamine Other (See Comments)    HALLUCINATIONS    Patient Measurements: Height: 5\' 6"  (167.6 cm) Weight: 92.5 kg (204 lb) IBW/kg (Calculated) : 59.3 Heparin Dosing Weight: 80.5 kg  Vital Signs: Temp: 98.4 F (36.9 C) (03/30 2009) Temp Source: Oral (03/30 2009) BP: 109/58 (03/30 2009) Pulse Rate: 69 (03/30 2009)  Labs: Recent Labs    12/18/20 0756 12/19/20 0314 12/20/20 0650 12/20/20 1659 12/21/20 0423  HGB 9.7* 9.5* 8.3*  --  7.4*  HCT 29.7* 30.1* 25.7*  --  23.6*  PLT 151 146* 179  --  184  APTT 78*  --   --   --   --   HEPARINUNFRC 0.34 0.26* 0.20* 0.19* 0.37  CREATININE 0.91  --  0.84  --  1.06*    Estimated Creatinine Clearance: 51.7 mL/min (A) (by C-G formula based on SCr of 1.06 mg/dL (H)).   Assessment: 75 yr old female admitted 12/15/20 after fall, with elbow/rib fracture and large hip hematoma; head CT negative. Pt was on apixaban for atrial fibrillation PTA, with last dose on 3/24 at 1900. Pharmacy was consulted to dose IV heparin. Due to large hip hematoma (500 ml fluid evacuated on 3/29), we are using  lower end of heparin goals (0.3-0.5 units/ml) and omitting boluses.   Heparin level therapeutic (0.37) on gtt at 1100 units/hr.   Goal of Therapy:  Heparin level 0.3-0.5 units/ml Monitor platelets by anticoagulation protocol: Yes   Plan:  Continue heparin infusion at 1100 units/hr Will f/u confirmatory heparin level  Sherlon Handing, PharmD, BCPS Please see amion for complete clinical pharmacist phone list 12/21/2020,5:14 AM

## 2020-12-21 NOTE — Progress Notes (Signed)
Physical Therapy Treatment Patient Details Name: Kayla Harrison MRN: [id number] DOB: 1946-06-21 Today's Date: 12/21/2020    History of Present Illness Pt is a 75 y.o. female admitted on 12/15/20 presenting with a fall.  Patient has been very unsteady.  Multiple falls over the last several weeks.  Presents with painful hip, and non displaced fxs to L elbow, and  R ribs. Large hematoma over left hip that is now s/p hematoma evacuation on 12/19/20 with wound vac placement.  Syncopal event with PT on 12/20/20.  PMH: thyroid cancer/lung cancer/NHL with h/o SVC syndrome; CVA; pacemaker placement; afib on Eliquis; obesity s/p lap band surgery; hypothyroidism; HTN; and HLD.    PT Comments    Pt supine in bed on arrival and eager to mobilize.  Performed mobility slow and assessed orthostatic vitals.  Pt continues to present with mild dizziness but able to tolerate increased activity.  Continue to recommend CIR for medical management during her rehab recovery.    Orthostatic vitals:  Supine-109/47 Sitting - 108/49 Standing - 120/110 ( likely a skewed reading as she was having difficulty remaining still )    Follow Up Recommendations  CIR;Supervision/Assistance - 24 hour     Equipment Recommendations  None recommended by PT    Recommendations for Other Services       Precautions / Restrictions Precautions Precautions: Fall Restrictions Weight Bearing Restrictions: Yes LUE Weight Bearing: Weight bearing as tolerated    Mobility  Bed Mobility Overal bed mobility: Needs Assistance Bed Mobility: Supine to Sit     Supine to sit: Min assist     General bed mobility comments: Min assistance for trunk elevation and use of bed pad to advance hips to edge of bed.  Denies dizziness sitting edge of bed.    Transfers Overall transfer level: Needs assistance Equipment used: Rolling walker (2 wheeled) Transfers: Sit to/from Stand Sit to Stand: Min assist;From elevated surface          General transfer comment: Cues for hand placement performed sit to stand x 2 reps.  Ambulation/Gait Ambulation/Gait assistance: Min assist;+2 safety/equipment (for close chair follow.) Gait Distance (Feet): 15 Feet (+ 20 ft.) Assistive device: Rolling walker (2 wheeled) Gait Pattern/deviations: Step-to pattern;Trunk flexed;Decreased stride length Gait velocity: decreased   General Gait Details: Pt able to progress gt distance with mild dizziness.  Close chair follow used this session but no syncopal events.   Stairs             Wheelchair Mobility    Modified Rankin (Stroke Patients Only)       Balance Overall balance assessment: Needs assistance Sitting-balance support: No upper extremity supported;Feet supported Sitting balance-Leahy Scale: Good     Standing balance support: Bilateral upper extremity supported;During functional activity Standing balance-Leahy Scale: Poor Standing balance comment: patient reliant on at least single UE support                            Cognition Arousal/Alertness: Awake/alert Behavior During Therapy: WFL for tasks assessed/performed Overall Cognitive Status: Within Functional Limits for tasks assessed                                        Exercises General Exercises - Lower Extremity Long Arc Quad: AROM;Both;10 reps;Seated    General Comments        Pertinent Vitals/Pain Pain Assessment:  Faces Faces Pain Scale: Hurts even more Pain Location: headache "just behind my eyes" Pain Descriptors / Indicators: Sore;Aching Pain Intervention(s): Monitored during session;Repositioned    Home Living                      Prior Function            PT Goals (current goals can now be found in the care plan section) Acute Rehab PT Goals Patient Stated Goal: to return home, improve strength, stop falling Potential to Achieve Goals: Fair Progress towards PT goals: Progressing toward goals     Frequency    Min 4X/week      PT Plan Current plan remains appropriate    Co-evaluation              AM-PAC PT "6 Clicks" Mobility   Outcome Measure  Help needed turning from your back to your side while in a flat bed without using bedrails?: A Little Help needed moving from lying on your back to sitting on the side of a flat bed without using bedrails?: A Little Help needed moving to and from a bed to a chair (including a wheelchair)?: A Little Help needed standing up from a chair using your arms (e.g., wheelchair or bedside chair)?: A Little Help needed to walk in hospital room?: A Little Help needed climbing 3-5 steps with a railing? : A Little 6 Click Score: 18    End of Session Equipment Utilized During Treatment: Gait belt Activity Tolerance: Patient limited by fatigue Patient left: in chair;with call bell/phone within reach Nurse Communication: Mobility status PT Visit Diagnosis: Unsteadiness on feet (R26.81);Other abnormalities of gait and mobility (R26.89);Repeated falls (R29.6);Muscle weakness (generalized) (M62.81);Difficulty in walking, not elsewhere classified (R26.2);Pain Pain - Right/Left: Left Pain - part of body: Hip     Time: 1325-1345 PT Time Calculation (min) (ACUTE ONLY): 20 min  Charges:  $Gait Training: 8-22 mins                     Erasmo Leventhal , PTA Acute Rehabilitation Services Pager [phone] Office [phone]     Saleah Rishel Eli Hose 12/21/2020, 3:34 PM

## 2020-12-21 NOTE — Progress Notes (Signed)
Inpatient Rehabilitation Admissions Coordinator  I met with patient at bedside. Noted plans for VAC change and closure of wound if appropriate. I await patient tolerance for more intensive therapies before proceeding with CIR admit. Note anemic.  Danne Baxter, RN, MSN Rehab Admissions Coordinator [phone] 12/21/2020 11:51 AM

## 2020-12-21 NOTE — Progress Notes (Addendum)
PROGRESS NOTE  Kayla Harrison XBJ:[id number] DOB: 1946-07-29 DOA: 12/15/2020 PCP: [name], MD   LOS: 6 days   Brief narrative:  Patient is a 75 years old female with past medical history of thyroid cancer, lung cancer non-Hodgkin's lymphoma, CVA, history of atrial fibrillation on Eliquis, obesity status post lap band surgery, hypothyroidism, hyperlipidemia and hypertension who presented to hospital after sustaining a fall which was thought to be orthostatic in nature.  This was followed by a large hematoma on the left hip.  Patient underwent evacuation of the hematoma on 12/19/2020 with improvement in her symptoms.  Assessment/Plan:  Principal Problem:   Fall Active Problems:   HYPERLIPIDEMIA   Hypothyroidism   Non-Hodgkin lymphoma, unspecified, intrathoracic lymph nodes (HCC)   Persistent atrial fibrillation (HCC)   Pacemaker   Hip hematoma, left, initial encounter   Nondisplaced fracture of coronoid process of left ulna, initial encounter for closed fracture   Multiple rib fractures   Orthostatic hypotension   History of CVA (cerebrovascular accident)  Falls suspected due to orthostatic hypotension Patient with history of recurrent falls.  Patient follows up with Dr. Caryl Comes cardiology as outpatient and was seen by Dr. Jonna Clark here in the hospital.  Cardiology had recommended TED hose Florinef and abdominal binder.  Florinef dose has been increased by Dr. Caryl Comes yesterday.  Physical therapy on board and patient did experience syncopal event yesterday.  Patient will likely benefit from CIR as per PT.  Constipation.  Will add magnesium citrate today.  On Dulcolax Colace and MiraLAX  Persistent atrial fibrillation. Evacuation of the hematoma on 12/19/2020.  Was put on heparin drip due to significant risk thrombosis.  We will continue to monitor hemoglobin.  On amiodarone.  History of pacemaker.    Left hip hematoma. Post hematoma evacuation.  Eliquis on hold.  Currently on  heparin drip.  We will continue to monitor hemoglobin.  Latest hemoglobin of 7.4.  Transfuse for hemoglobin less than 7.  Platelet count has improved.  CBC Latest Ref Rng & Units 12/21/2020 12/20/2020 12/19/2020  WBC 4.0 - 10.5 K/uL 10.3 14.1(H) 8.8  Hemoglobin 12.0 - 15.0 g/dL 7.4(L) 8.3(L) 9.5(L)  Hematocrit 36.0 - 46.0 % 23.6(L) 25.7(L) 30.1(L)  Platelets 150 - 400 K/uL 184 179 146(L)    Left elbow fracture Status post splint with outpatient plan for Ortho follow-up.  Supportive care.  Possible R rib fractures -Xray with lateral R 7/8 rib fractures which were not seen on CT. We will continue lidocaine patch and supportive care.  Continue incentive spirometry.  Hyperlipidemia Continue statins  Hypothyroidism TSH within normal limits.  Continue Synthroid  Class 1 obesity -Body mass index is 32.93 kg/m. status post laparoscopic banding surgery.  History of non-Hodgkin's lymphoma Seen by Dr. Lindi Adie as outpatient. History of lung mass thyroid cancer.  DVT prophylaxis: Place and maintain sequential compression device Start: 12/18/20 1251 Place TED hose Start: 12/18/20 0934 Place TED hose Start: 12/17/20 3086   Code Status: Full code  Family Communication:  I tried to reach the patient's friend listed in the computer but was unable to reach her today.  Status is: Inpatient  Remains inpatient appropriate because:IV treatments appropriate due to intensity of illness or inability to take PO and Inpatient level of care appropriate due to severity of illness status post a hematoma evacuation, syncope, need for CIR.   Dispo: The patient is from: Home, lives alone              Anticipated d/c is to:  CIR              Patient currently is not medically stable to d/c.   Difficult to place patient No  Consultants:  Orthopedics  Cardiology  Procedures:  Evacuation of the left lateral hip hematoma on 12/19/2020  Anti-infectives:  . None  Anti-infectives (From admission,  onward)   Start     Dose/Rate Route Frequency Ordered Stop   12/20/20 0000  ceFAZolin (ANCEF) IVPB 1 g/50 mL premix        1 g 100 mL/hr over 30 Minutes Intravenous Every 8 hours 12/19/20 2155 12/20/20 1642   12/19/20 1015  ceFAZolin (ANCEF) IVPB 2g/100 mL premix        2 g 200 mL/hr over 30 Minutes Intravenous On call to O.R. 12/19/20 0920 12/19/20 1738     Subjective: Today, patient was seen and examined at bedside.  Events of mild hip pain.  She remained in chair.  States that she has not had a bowel movement in a week or so..   Objective: Vitals:   12/20/20 2009 12/21/20 0652  BP: (!) 109/58 (!) 109/52  Pulse: 69 69  Resp: 18 18  Temp: 98.4 F (36.9 C) 98.5 F (36.9 C)  SpO2: 98% 97%    Intake/Output Summary (Last 24 hours) at 12/21/2020 0736 Last data filed at 12/20/2020 1720 Gross per 24 hour  Intake 231.81 ml  Output --  Net 231.81 ml   Filed Weights   12/15/20 0552 12/15/20 1257  Weight: 94 kg 92.5 kg   Body mass index is 32.93 kg/m.   Physical Exam: General: Obese built, not in obvious distress HENT:   No scleral pallor or icterus noted. Oral mucosa is moist.  Chest:  Clear breath sounds.  Diminished breath sounds bilaterally. No crackles or wheezes.  CVS: S1 &S2 heard. No murmur.  Regular rate and rhythm. Abdomen: Soft, nontender, nondistended.  Bowel sounds are heard.   Extremities: Left hip hematoma with wound VAC. \Psych: Alert, awake and oriented, normal mood CNS:  No cranial nerve deficits.  Power equal in all extremities.   Skin: Left lower extremity ecchymosis, right arm ecchymosis   Data Review: I have personally reviewed the following laboratory data and studies,  CBC: Recent Labs  Lab 12/15/20 0559 12/16/20 0251 12/17/20 0122 12/18/20 0756 12/19/20 0314 12/20/20 0650 12/21/20 0423  WBC 8.3   < > 8.0 8.8 8.8 14.1* 10.3  NEUTROABS 6.2  --   --   --   --   --   --   HGB 13.4   < > 10.7* 9.7* 9.5* 8.3* 7.4*  HCT 41.5   < > 32.6* 29.7*  30.1* 25.7* 23.6*  MCV 94.3   < > 91.3 92.2 94.7 93.8 98.7  PLT 171   < > 172 151 146* 179 184   < > = values in this interval not displayed.   Basic Metabolic Panel: Recent Labs  Lab 12/16/20 0251 12/17/20 0122 12/18/20 0756 12/20/20 0650 12/21/20 0423  NA 135 136 136 137 138  K 4.5 4.5 4.4 4.8 4.5  CL 100 103 103 104 105  CO2 27 28 28 28 28   GLUCOSE 94 118* 109* 142* 114*  BUN 19 25* 20 20 25*  CREATININE 0.99 1.03* 0.91 0.84 1.06*  CALCIUM 8.5* 8.3* 8.3* 8.2* 8.0*  MG  --   --   --   --  1.9  PHOS  --   --   --   --  3.0   Liver Function Tests: Recent Labs  Lab 12/21/20 0423  AST 54*  ALT 18  ALKPHOS 50  BILITOT 1.0  PROT 4.8*  ALBUMIN 2.1*   No results for input(s): LIPASE, AMYLASE in the last 168 hours. No results for input(s): AMMONIA in the last 168 hours. Cardiac Enzymes: No results for input(s): CKTOTAL, CKMB, CKMBINDEX, TROPONINI in the last 168 hours. BNP (last 3 results) No results for input(s): BNP in the last 8760 hours.  ProBNP (last 3 results) No results for input(s): PROBNP in the last 8760 hours.  CBG: No results for input(s): GLUCAP in the last 168 hours. Recent Results (from the past 240 hour(s))  Resp Panel by RT-PCR (Flu A&B, Covid) Nasopharyngeal Swab     Status: None   Collection Time: 12/15/20  8:44 AM   Specimen: Nasopharyngeal Swab; Nasopharyngeal(NP) swabs in vial transport medium  Result Value Ref Range Status   SARS Coronavirus 2 by RT PCR NEGATIVE NEGATIVE Final    Comment: (NOTE) SARS-CoV-2 target nucleic acids are NOT DETECTED.  The SARS-CoV-2 RNA is generally detectable in upper respiratory specimens during the acute phase of infection. The lowest concentration of SARS-CoV-2 viral copies this assay can detect is 138 copies/mL. A negative result does not preclude SARS-Cov-2 infection and should not be used as the sole basis for treatment or other patient management decisions. A negative result may occur with  improper  specimen collection/handling, submission of specimen other than nasopharyngeal swab, presence of viral mutation(s) within the areas targeted by this assay, and inadequate number of viral copies(<138 copies/mL). A negative result must be combined with clinical observations, patient history, and epidemiological information. The expected result is Negative.  Fact Sheet for Patients:  EntrepreneurPulse.com.au  Fact Sheet for Healthcare Providers:  IncredibleEmployment.be  This test is no t yet approved or cleared by the [place] FDA and  has been authorized for detection and/or diagnosis of SARS-CoV-2 by FDA under an Emergency Use Authorization (EUA). This EUA will remain  in effect (meaning this test can be used) for the duration of the COVID-19 declaration under Section 564(b)(1) of the Act, 21 U.S.C.section 360bbb-3(b)(1), unless the authorization is terminated  or revoked sooner.       Influenza A by PCR NEGATIVE NEGATIVE Final   Influenza B by PCR NEGATIVE NEGATIVE Final    Comment: (NOTE) The Xpert Xpress SARS-CoV-2/FLU/RSV plus assay is intended as an aid in the diagnosis of influenza from Nasopharyngeal swab specimens and should not be used as a sole basis for treatment. Nasal washings and aspirates are unacceptable for Xpert Xpress SARS-CoV-2/FLU/RSV testing.  Fact Sheet for Patients: EntrepreneurPulse.com.au  Fact Sheet for Healthcare Providers: IncredibleEmployment.be  This test is not yet approved or cleared by the [place] FDA and has been authorized for detection and/or diagnosis of SARS-CoV-2 by FDA under an Emergency Use Authorization (EUA). This EUA will remain in effect (meaning this test can be used) for the duration of the COVID-19 declaration under Section 564(b)(1) of the Act, 21 U.S.C. section 360bbb-3(b)(1), unless the authorization is terminated or revoked.  Performed at  [facility], [place] [address], [place],  [address]   Surgical PCR screen     Status: None   Collection Time: 12/19/20  3:16 AM   Specimen: Nasal Mucosa; Nasal Swab  Result Value Ref Range Status   MRSA, PCR NEGATIVE NEGATIVE Final   Staphylococcus aureus NEGATIVE NEGATIVE Final    Comment: Performed at [facility], [place]  [address], [place], [place] [address]     Studies: MR HIP LEFT WO CONTRAST  Result Date: 12/19/2020 CLINICAL DATA:  Fall, left hip pain with large subcutaneous hematoma EXAM: MR OF THE LEFT HIP WITHOUT CONTRAST TECHNIQUE: Multiplanar, multisequence MR imaging was performed. No intravenous contrast was administered. COMPARISON:  CT and x-ray 12/15/2020 FINDINGS: Bones: No acute fracture. No dislocation. No femoral head avascular necrosis. No bone marrow edema. No marrow replacing bone lesion. Articular cartilage and labrum Articular cartilage: Mild chondral thinning of the bilateral hip joints without focal cartilage defect. No subchondral marrow signal changes. Labrum: Partial-thickness tear of the anterosuperior labrum (series 11, images 9-10). Joint or bursal effusion Joint effusion:  Trace bilateral joint effusions, nonspecific. Bursae: No abnormal bursal fluid collection. Muscles and tendons Muscles and tendons: Low-grade partial tears of the bilateral hamstring tendon origins. Remaining tendinous structures about the left hip are intact. Intramuscular edema within the distal left gluteus maximus, likely posttraumatic. Other findings Miscellaneous: Large complex fluid collection within the subcutaneous soft tissues overlying the posterolateral aspect of the left hip with layering hematocrit levels and areas of T1 intermediate signal compatible with hematoma. The full extent of the collection was not entirely included within the field of view but measures at least 10 x 10 x 6 cm. Difficult to determine if the collection has significantly changed in size from the  prior CT as the full extent of the collection was also not entirely included within the field of view. Extensive surrounding soft tissue edema. Colonic diverticulosis is noted. IMPRESSION: 1. Large hematoma within the subcutaneous soft tissues overlying the left hip measuring at least 10 cm in size. Difficult to determine if the collection has significantly changed in size from the prior CT as the full extent of the collection was also not entirely included within the field of view on either study. 2. No acute osseous abnormality of the left hip. 3. Mild left hip arthropathy with partial-thickness tear of the anterosuperior labrum. 4. Low-grade partial tears of the bilateral hamstring tendon origins. Electronically Signed   By: Davina Poke D.O.   On: 12/19/2020 11:01     Flora Lipps, MD  [facility] 12/21/2020   If 7PM-7AM, please contact night-coverage

## 2020-12-21 NOTE — Consult Note (Signed)
WOC Nurse Consult Note:  Patient receiving care in [facility] 5N30. I have called the [place] office and asked to have Dr. Gwenlyn Saran contact me about this WOC consult that has been placed.  I gave the answering staff member my name, personal cell number and pager number. Awaiting a response. Val Riles, RN, MSN, CWOCN, CNS-BC, pager [phone]

## 2020-12-21 NOTE — Progress Notes (Signed)
Nutrition Follow Up  DOCUMENTATION CODES:   Not applicable  INTERVENTION:   Liberalize diet to REGULAR  No BM x 7 days- plan magnesium citrate   Magic cup TID with meals, each supplement provides 290 kcal and 9 grams of protein  MVI with minerals daily  NUTRITION DIAGNOSIS:   Increased nutrient needs related to acute illness (broken L arm and 2 rib fx) as evidenced by estimated needs.  Ongoing  GOAL:   Patient will meet greater than or equal to 90% of their needs   Progressing   MONITOR:   PO intake,Supplement acceptance,Labs  REASON FOR ASSESSMENT:   Consult  (Nutritional goals)  ASSESSMENT:   75 yo female with a PMH of dyslipidemia, hypothyroidism, hx of dysphagia, anemia, persistent A-fib, chronic bilateral pleural effusions, s/p pacemaker, hx of breast, lung cancer and non-Hodgkin's lymphoma, arthritis, and HTN who presents with a fall.   3/29- s/p incision/drainage with evacuation of lateral hip hematoma, Wound VAC  Wound VAC changed this am. Intake stable. Last 5 meal completions charted as 75-100%. Liberalize diet to regular to provide more option. Continue Magic Cup TID.   Admission weight: 92.5 kg (no current weight obtained)  Medications: colace Labs: CBG 109-142  Diet Order:   Diet Order            Diet Heart Room service appropriate? Yes with Assist; Fluid consistency: Thin  Diet effective now                EDUCATION NEEDS:  Education needs have been addressed  Skin:  Skin Assessment: Skin Integrity Issues: Skin Integrity Issues:: Other (Comment) Other: Ecchymosis on L hip  Last BM:  3/24  Height:  Ht Readings from Last 1 Encounters:  12/15/20 5\' 6"  (1.676 m)   Weight:  Wt Readings from Last 1 Encounters:  12/15/20 92.5 kg   Ideal Body Weight:  59.1 kg  BMI:  Body mass index is 32.93 kg/m.  Estimated Nutritional Needs:   Kcal:  1650-1850   Protein:  75-90 grams   Fluid:  >1.65 L  Mariana Single RD, LDN Clinical  Nutrition Pager listed in [place]

## 2020-12-21 NOTE — Progress Notes (Signed)
ANTICOAGULATION CONSULT NOTE - Follow Up Consult  Pharmacy Consult for Heparin Indication: atrial fibrillation  Allergies  Allergen Reactions  . Dofetilide Other (See Comments)    Prolonged QT interval   . Ranolazine     Balance issues  . Rivaroxaban Other (See Comments)    Nose Bleed X 6 hrs ; packing in ER Nasal hemorrhage  . Rivaroxaban     Nose Bleed X 6 hrs ; packing in ER Nasal hemorrhage  . Tape Other (See Comments)    SKIN IS THIN AND TEARS EASILY   . Tikosyn [Dofetilide] Other (See Comments)    "Long QT wave"   . Epinephrine Other (See Comments)    Oral anesthetic Dental form only (liquid). Patient stated she will become "out of it" she can hear you but cannot respond in normal fashion. "not fully with it"  . Epinephrine   . Ketamine Other (See Comments)    Hallucinations   . Adhesive [Tape] Rash    Paper tape as well  . Ketamine Other (See Comments)    HALLUCINATIONS    Patient Measurements: Height: 5\' 6"  (167.6 cm) Weight: 92.5 kg (204 lb) IBW/kg (Calculated) : 59.3 Heparin Dosing Weight:    Vital Signs: Temp: 98.9 F (37.2 C) (03/31 1743) Temp Source: Oral (03/31 1743) BP: 103/52 (03/31 1743) Pulse Rate: 69 (03/31 1743)  Labs: Recent Labs    12/19/20 0314 12/20/20 0650 12/20/20 1659 12/21/20 0423 12/21/20 1245 12/21/20 2031  HGB 9.5* 8.3*  --  7.4*  --   --   HCT 30.1* 25.7*  --  23.6*  --   --   PLT 146* 179  --  184  --   --   HEPARINUNFRC 0.26* 0.20*   < > 0.37 0.20* 0.48  CREATININE  --  0.84  --  1.06*  --   --    < > = values in this interval not displayed.    Estimated Creatinine Clearance: 51.7 mL/min (A) (by C-G formula based on SCr of 1.06 mg/dL (H)).   Assessment:  Anticoag:  s/p fall, on Eliquis PTA for afib with last dose taken 3/24 @1900 .  CT head negative, elbow/rib fx, large hip hematoma, OK to resume AC per MD.  CHADS2VASC 7. - Will use low end goals and will not bolus. - 3/29: Approximately 500 cc of hematoma  fluid was evacuated.  Heparin level now therapeutic at 0.48  Goal of Therapy:  Heparin level 0.3-0.5 units/ml Monitor platelets by anticoagulation protocol: Yes   Plan:  Continue heparin at 1250 units / hr Daily HL and CBC  Not stable to resume [place] yet.  Thank you Anette Guarneri, PharmD [email or web address] 12/21/2020,9:47 PM

## 2020-12-21 NOTE — Progress Notes (Signed)
ANTICOAGULATION CONSULT NOTE - Follow Up Consult  Pharmacy Consult for Heparin Indication: atrial fibrillation  Allergies  Allergen Reactions  . Dofetilide Other (See Comments)    Prolonged QT interval   . Ranolazine     Balance issues  . Rivaroxaban Other (See Comments)    Nose Bleed X 6 hrs ; packing in ER Nasal hemorrhage  . Rivaroxaban     Nose Bleed X 6 hrs ; packing in ER Nasal hemorrhage  . Tape Other (See Comments)    SKIN IS THIN AND TEARS EASILY   . Tikosyn [Dofetilide] Other (See Comments)    "Long QT wave"   . Epinephrine Other (See Comments)    Oral anesthetic Dental form only (liquid). Patient stated she will become "out of it" she can hear you but cannot respond in normal fashion. "not fully with it"  . Epinephrine   . Ketamine Other (See Comments)    Hallucinations   . Adhesive [Tape] Rash    Paper tape as well  . Ketamine Other (See Comments)    HALLUCINATIONS    Patient Measurements: Height: 5\' 6"  (167.6 cm) Weight: 92.5 kg (204 lb) IBW/kg (Calculated) : 59.3 Heparin Dosing Weight:    Vital Signs: Temp: 98.1 F (36.7 C) (03/31 1015) Temp Source: Oral (03/31 1015) BP: 95/52 (03/31 1015) Pulse Rate: 69 (03/31 1015)  Labs: Recent Labs    12/19/20 0314 12/20/20 0650 12/20/20 1659 12/21/20 0423 12/21/20 1245  HGB 9.5* 8.3*  --  7.4*  --   HCT 30.1* 25.7*  --  23.6*  --   PLT 146* 179  --  184  --   HEPARINUNFRC 0.26* 0.20* 0.19* 0.37 0.20*  CREATININE  --  0.84  --  1.06*  --     Estimated Creatinine Clearance: 51.7 mL/min (A) (by C-G formula based on SCr of 1.06 mg/dL (H)).   Assessment:  Anticoag:  s/p fall, on Eliquis PTA for afib with last dose taken 3/24 @1900 .  CT head negative, elbow/rib fx, large hip hematoma, OK to resume AC per MD.  CHADS2VASC 7. - Will use low end goals and will not bolus. - 3/29: Approximately 500 cc of hematoma fluid was evacuated. - Hep level 0.37>0.2 lower today. Hgb 13.4 baseline>down to 7.4.  Transfuse for Hgb <7  Goal of Therapy:  Heparin level 0.3-0.5 units/ml Monitor platelets by anticoagulation protocol: Yes   Plan:  Increase heparin to 1250 units/hr Recheck in 6 hrs. Daily HL and CBC  Not stable to resume [place] yet.  Kayla Harrison S. Alford Highland, PharmD, BCPS Clinical Staff Pharmacist [email or web address] Alford Highland, [place] 12/21/2020,2:29 PM

## 2020-12-22 ENCOUNTER — Inpatient Hospital Stay (HOSPITAL_COMMUNITY)
Admission: RE | Admit: 2020-12-22 | Discharge: 2021-01-09 | DRG: 945 | Disposition: A | Discharge status: Home Health | Payer: Medicare Other | Source: Intra-hospital | Attending: Physical Medicine and Rehabilitation | Admitting: Physical Medicine and Rehabilitation

## 2020-12-22 ENCOUNTER — Other Ambulatory Visit: Payer: Self-pay

## 2020-12-22 DIAGNOSIS — Y848 Other medical procedures as the cause of abnormal reaction of the patient, or of later complication, without mention of misadventure at the time of the procedure: Secondary | ICD-10-CM | POA: Diagnosis not present

## 2020-12-22 DIAGNOSIS — G4700 Insomnia, unspecified: Secondary | ICD-10-CM | POA: Diagnosis present

## 2020-12-22 DIAGNOSIS — L603 Nail dystrophy: Secondary | ICD-10-CM | POA: Diagnosis present

## 2020-12-22 DIAGNOSIS — M79672 Pain in left foot: Secondary | ICD-10-CM | POA: Diagnosis not present

## 2020-12-22 DIAGNOSIS — Z85850 Personal history of malignant neoplasm of thyroid: Secondary | ICD-10-CM

## 2020-12-22 DIAGNOSIS — Z8616 Personal history of COVID-19: Secondary | ICD-10-CM | POA: Diagnosis not present

## 2020-12-22 DIAGNOSIS — Z85118 Personal history of other malignant neoplasm of bronchus and lung: Secondary | ICD-10-CM | POA: Diagnosis not present

## 2020-12-22 DIAGNOSIS — T148XXA Other injury of unspecified body region, initial encounter: Secondary | ICD-10-CM

## 2020-12-22 DIAGNOSIS — I110 Hypertensive heart disease with heart failure: Secondary | ICD-10-CM | POA: Diagnosis present

## 2020-12-22 DIAGNOSIS — D72829 Elevated white blood cell count, unspecified: Secondary | ICD-10-CM | POA: Diagnosis present

## 2020-12-22 DIAGNOSIS — Z7901 Long term (current) use of anticoagulants: Secondary | ICD-10-CM | POA: Diagnosis not present

## 2020-12-22 DIAGNOSIS — M79601 Pain in right arm: Secondary | ICD-10-CM | POA: Diagnosis not present

## 2020-12-22 DIAGNOSIS — E890 Postprocedural hypothyroidism: Secondary | ICD-10-CM | POA: Diagnosis present

## 2020-12-22 DIAGNOSIS — I69320 Aphasia following cerebral infarction: Secondary | ICD-10-CM

## 2020-12-22 DIAGNOSIS — Z91048 Other nonmedicinal substance allergy status: Secondary | ICD-10-CM

## 2020-12-22 DIAGNOSIS — M170 Bilateral primary osteoarthritis of knee: Secondary | ICD-10-CM | POA: Diagnosis present

## 2020-12-22 DIAGNOSIS — S73102D Unspecified sprain of left hip, subsequent encounter: Secondary | ICD-10-CM

## 2020-12-22 DIAGNOSIS — S7002XD Contusion of left hip, subsequent encounter: Secondary | ICD-10-CM

## 2020-12-22 DIAGNOSIS — Z923 Personal history of irradiation: Secondary | ICD-10-CM

## 2020-12-22 DIAGNOSIS — N390 Urinary tract infection, site not specified: Secondary | ICD-10-CM | POA: Diagnosis not present

## 2020-12-22 DIAGNOSIS — S76312D Strain of muscle, fascia and tendon of the posterior muscle group at thigh level, left thigh, subsequent encounter: Secondary | ICD-10-CM | POA: Diagnosis not present

## 2020-12-22 DIAGNOSIS — K5903 Drug induced constipation: Secondary | ICD-10-CM | POA: Diagnosis not present

## 2020-12-22 DIAGNOSIS — I4891 Unspecified atrial fibrillation: Secondary | ICD-10-CM | POA: Diagnosis not present

## 2020-12-22 DIAGNOSIS — W1830XD Fall on same level, unspecified, subsequent encounter: Secondary | ICD-10-CM

## 2020-12-22 DIAGNOSIS — K5900 Constipation, unspecified: Secondary | ICD-10-CM | POA: Diagnosis present

## 2020-12-22 DIAGNOSIS — S76312A Strain of muscle, fascia and tendon of the posterior muscle group at thigh level, left thigh, initial encounter: Secondary | ICD-10-CM | POA: Diagnosis present

## 2020-12-22 DIAGNOSIS — Z79899 Other long term (current) drug therapy: Secondary | ICD-10-CM

## 2020-12-22 DIAGNOSIS — I495 Sick sinus syndrome: Secondary | ICD-10-CM | POA: Diagnosis present

## 2020-12-22 DIAGNOSIS — S73192A Other sprain of left hip, initial encounter: Secondary | ICD-10-CM | POA: Diagnosis present

## 2020-12-22 DIAGNOSIS — R52 Pain, unspecified: Secondary | ICD-10-CM

## 2020-12-22 DIAGNOSIS — Z950 Presence of cardiac pacemaker: Secondary | ICD-10-CM | POA: Diagnosis not present

## 2020-12-22 DIAGNOSIS — Z853 Personal history of malignant neoplasm of breast: Secondary | ICD-10-CM

## 2020-12-22 DIAGNOSIS — R296 Repeated falls: Secondary | ICD-10-CM | POA: Diagnosis present

## 2020-12-22 DIAGNOSIS — I890 Lymphedema, not elsewhere classified: Secondary | ICD-10-CM | POA: Diagnosis present

## 2020-12-22 DIAGNOSIS — Z90710 Acquired absence of both cervix and uterus: Secondary | ICD-10-CM

## 2020-12-22 DIAGNOSIS — Z96611 Presence of right artificial shoulder joint: Secondary | ICD-10-CM | POA: Diagnosis present

## 2020-12-22 DIAGNOSIS — I951 Orthostatic hypotension: Secondary | ICD-10-CM | POA: Diagnosis present

## 2020-12-22 DIAGNOSIS — R5381 Other malaise: Principal | ICD-10-CM | POA: Diagnosis present

## 2020-12-22 DIAGNOSIS — E785 Hyperlipidemia, unspecified: Secondary | ICD-10-CM | POA: Diagnosis present

## 2020-12-22 DIAGNOSIS — S73192S Other sprain of left hip, sequela: Secondary | ICD-10-CM | POA: Diagnosis not present

## 2020-12-22 DIAGNOSIS — S5001XD Contusion of right elbow, subsequent encounter: Secondary | ICD-10-CM | POA: Diagnosis not present

## 2020-12-22 DIAGNOSIS — M8580 Other specified disorders of bone density and structure, unspecified site: Secondary | ICD-10-CM | POA: Diagnosis present

## 2020-12-22 DIAGNOSIS — Z9221 Personal history of antineoplastic chemotherapy: Secondary | ICD-10-CM

## 2020-12-22 DIAGNOSIS — G8929 Other chronic pain: Secondary | ICD-10-CM | POA: Diagnosis present

## 2020-12-22 DIAGNOSIS — Z8572 Personal history of non-Hodgkin lymphomas: Secondary | ICD-10-CM

## 2020-12-22 DIAGNOSIS — Z885 Allergy status to narcotic agent status: Secondary | ICD-10-CM

## 2020-12-22 DIAGNOSIS — Z79890 Hormone replacement therapy (postmenopausal): Secondary | ICD-10-CM

## 2020-12-22 DIAGNOSIS — Z9884 Bariatric surgery status: Secondary | ICD-10-CM

## 2020-12-22 DIAGNOSIS — S76312S Strain of muscle, fascia and tendon of the posterior muscle group at thigh level, left thigh, sequela: Secondary | ICD-10-CM | POA: Diagnosis not present

## 2020-12-22 DIAGNOSIS — I4819 Other persistent atrial fibrillation: Secondary | ICD-10-CM | POA: Diagnosis present

## 2020-12-22 DIAGNOSIS — D62 Acute posthemorrhagic anemia: Secondary | ICD-10-CM | POA: Diagnosis present

## 2020-12-22 DIAGNOSIS — E039 Hypothyroidism, unspecified: Secondary | ICD-10-CM | POA: Diagnosis not present

## 2020-12-22 DIAGNOSIS — Z96651 Presence of right artificial knee joint: Secondary | ICD-10-CM | POA: Diagnosis not present

## 2020-12-22 DIAGNOSIS — S52042D Displaced fracture of coronoid process of left ulna, subsequent encounter for closed fracture with routine healing: Secondary | ICD-10-CM | POA: Diagnosis not present

## 2020-12-22 DIAGNOSIS — I5032 Chronic diastolic (congestive) heart failure: Secondary | ICD-10-CM | POA: Diagnosis present

## 2020-12-22 DIAGNOSIS — Z888 Allergy status to other drugs, medicaments and biological substances status: Secondary | ICD-10-CM

## 2020-12-22 DIAGNOSIS — T8131XA Disruption of external operation (surgical) wound, not elsewhere classified, initial encounter: Secondary | ICD-10-CM | POA: Diagnosis not present

## 2020-12-22 LAB — BASIC METABOLIC PANEL
Anion gap: 6 (ref 5–15)
BUN: 25 mg/dL — ABNORMAL HIGH (ref 8–23)
CO2: 27 mmol/L (ref 22–32)
Calcium: 8.2 mg/dL — ABNORMAL LOW (ref 8.9–10.3)
Chloride: 104 mmol/L (ref 98–111)
Creatinine, Ser: 0.93 mg/dL (ref 0.44–1.00)
GFR, Estimated: >60 mL/min (ref >60)
Glucose, Bld: 117 mg/dL — ABNORMAL HIGH (ref 70–99)
Potassium: 4.6 mmol/L (ref 3.5–5.1)
Sodium: 137 mmol/L (ref 135–145)

## 2020-12-22 LAB — CBC
HCT: 23.1 % — ABNORMAL LOW (ref 36.0–46.0)
Hemoglobin: 7.2 g/dL — ABNORMAL LOW (ref 12.0–15.0)
MCH: 29.9 pg (ref 26.0–34.0)
MCHC: 31.2 g/dL (ref 30.0–36.0)
MCV: 95.9 fL (ref 80.0–100.0)
Platelets: 202 10*3/uL (ref 150–400)
RBC: 2.41 MIL/uL — ABNORMAL LOW (ref 3.87–5.11)
RDW: 15.4 % (ref 11.5–15.5)
WBC: 12 10*3/uL — ABNORMAL HIGH (ref 4.0–10.5)
nRBC: 0.2 % (ref 0.0–0.2)

## 2020-12-22 LAB — MAGNESIUM: Magnesium: 1.9 mg/dL (ref 1.7–2.4)

## 2020-12-22 LAB — HEPARIN LEVEL (UNFRACTIONATED): Heparin Unfractionated: 0.56 IU/mL (ref 0.30–0.70)

## 2020-12-22 LAB — PREPARE RBC (CROSSMATCH)

## 2020-12-22 MED ORDER — OXYCODONE-ACETAMINOPHEN 5-325 MG PO TABS: 1.0000 | ORAL_TABLET | ORAL | 0 refills | Status: DC | PRN

## 2020-12-22 MED ORDER — BISACODYL 5 MG PO TBEC: 5.0000 mg | DELAYED_RELEASE_TABLET | Freq: Every day | ORAL | Status: DC | PRN

## 2020-12-22 MED ORDER — LIDOCAINE 5 % EX PTCH
2.0000 | MEDICATED_PATCH | CUTANEOUS | Status: DC
  Administered 2020-12-22 – 2020-12-28 (×5): 2 via TRANSDERMAL
  Filled 2020-12-22 (×10): qty 2

## 2020-12-22 MED ORDER — POLYETHYLENE GLYCOL 3350 17 G PO PACK: 17.0000 g | PACK | Freq: Every day | ORAL | Status: DC | PRN

## 2020-12-22 MED ORDER — TRAZODONE HCL 50 MG PO TABS
25.0000 mg | ORAL_TABLET | Freq: Every evening | ORAL | Status: DC | PRN
  Administered 2020-12-24 – 2021-01-01 (×4): 50 mg via ORAL
  Administered 2021-01-06 (×2): 25 mg via ORAL
  Administered 2021-01-07 – 2021-01-08 (×2): 50 mg via ORAL
  Filled 2020-12-22 (×9): qty 1

## 2020-12-22 MED ORDER — ADULT MULTIVITAMIN W/MINERALS CH
1.0000 | ORAL_TABLET | Freq: Every day | ORAL | Status: DC
  Administered 2020-12-23 – 2021-01-09 (×18): 1 via ORAL
  Filled 2020-12-22 (×18): qty 1

## 2020-12-22 MED ORDER — GUAIFENESIN-DM 100-10 MG/5ML PO SYRP
5.0000 mL | ORAL_SOLUTION | Freq: Four times a day (QID) | ORAL | Status: DC | PRN
  Filled 2020-12-22: qty 10

## 2020-12-22 MED ORDER — FLUDROCORTISONE ACETATE 0.1 MG PO TABS
0.2000 mg | ORAL_TABLET | Freq: Two times a day (BID) | ORAL | Status: DC
  Administered 2020-12-22 – 2021-01-06 (×30): 0.2 mg via ORAL
  Filled 2020-12-22 (×34): qty 2

## 2020-12-22 MED ORDER — DIPHENHYDRAMINE HCL 12.5 MG/5ML PO ELIX: 12.5000 mg | ORAL_SOLUTION | Freq: Four times a day (QID) | ORAL | Status: DC | PRN

## 2020-12-22 MED ORDER — LIDOCAINE 5 % EX PTCH: 2.0000 | MEDICATED_PATCH | CUTANEOUS | Status: DC

## 2020-12-22 MED ORDER — PROCHLORPERAZINE 25 MG RE SUPP
12.5000 mg | Freq: Four times a day (QID) | RECTAL | Status: DC | PRN
  Filled 2020-12-22: qty 1

## 2020-12-22 MED ORDER — ACETAMINOPHEN 325 MG PO TABS
325.0000 mg | ORAL_TABLET | ORAL | Status: DC | PRN
  Administered 2020-12-22 – 2020-12-23 (×3): 650 mg via ORAL
  Filled 2020-12-22 (×3): qty 2

## 2020-12-22 MED ORDER — SODIUM CHLORIDE 0.9% IV SOLUTION: Freq: Once | INTRAVENOUS | Status: DC

## 2020-12-22 MED ORDER — APIXABAN 5 MG PO TABS: 5.0000 mg | ORAL_TABLET | Freq: Two times a day (BID) | ORAL | Status: DC

## 2020-12-22 MED ORDER — DOCUSATE SODIUM 100 MG PO CAPS: 100.0000 mg | ORAL_CAPSULE | Freq: Two times a day (BID) | ORAL | Status: DC

## 2020-12-22 MED ORDER — PROCHLORPERAZINE MALEATE 5 MG PO TABS
5.0000 mg | ORAL_TABLET | Freq: Four times a day (QID) | ORAL | Status: DC | PRN
  Filled 2020-12-22: qty 2

## 2020-12-22 MED ORDER — FLUDROCORTISONE ACETATE 0.1 MG PO TABS: 0.2000 mg | ORAL_TABLET | Freq: Two times a day (BID) | ORAL | Status: DC

## 2020-12-22 MED ORDER — FLUDROCORTISONE ACETATE 0.1 MG PO TABS
0.2000 mg | ORAL_TABLET | Freq: Two times a day (BID) | ORAL | Status: DC
  Filled 2020-12-22: qty 2

## 2020-12-22 MED ORDER — PROCHLORPERAZINE EDISYLATE 10 MG/2ML IJ SOLN: 5.0000 mg | Freq: Four times a day (QID) | INTRAMUSCULAR | Status: DC | PRN

## 2020-12-22 MED ORDER — SENNA 8.6 MG PO TABS
2.0000 | ORAL_TABLET | Freq: Every day | ORAL | Status: DC
  Administered 2020-12-23: 17.2 mg via ORAL
  Filled 2020-12-22: qty 2

## 2020-12-22 MED ORDER — BISACODYL 10 MG RE SUPP
10.0000 mg | Freq: Every day | RECTAL | Status: DC | PRN
  Administered 2020-12-23: 10 mg via RECTAL
  Filled 2020-12-22: qty 1

## 2020-12-22 MED ORDER — ROSUVASTATIN CALCIUM 5 MG PO TABS
10.0000 mg | ORAL_TABLET | ORAL | Status: DC
  Administered 2020-12-25 – 2021-01-08 (×5): 10 mg via ORAL
  Filled 2020-12-22 (×4): qty 2

## 2020-12-22 MED ORDER — AMIODARONE HCL 200 MG PO TABS
200.0000 mg | ORAL_TABLET | Freq: Every day | ORAL | Status: DC
  Administered 2020-12-23 – 2021-01-09 (×18): 200 mg via ORAL
  Filled 2020-12-22 (×18): qty 1

## 2020-12-22 MED ORDER — ONDANSETRON HCL 4 MG PO TABS: 4.0000 mg | ORAL_TABLET | Freq: Four times a day (QID) | ORAL | 0 refills | Status: DC | PRN

## 2020-12-22 MED ORDER — FLEET ENEMA 7-19 GM/118ML RE ENEM: 1.0000 | ENEMA | Freq: Once | RECTAL | Status: DC | PRN

## 2020-12-22 MED ORDER — OXYCODONE HCL 5 MG PO TABS
5.0000 mg | ORAL_TABLET | ORAL | Status: DC | PRN
  Administered 2020-12-22 – 2020-12-23 (×3): 10 mg via ORAL
  Administered 2020-12-25 – 2020-12-26 (×3): 5 mg via ORAL
  Filled 2020-12-22 (×2): qty 1
  Filled 2020-12-22: qty 2
  Filled 2020-12-22: qty 1
  Filled 2020-12-22 (×2): qty 2
  Filled 2020-12-22: qty 1
  Filled 2020-12-22: qty 2

## 2020-12-22 MED ORDER — LEVOTHYROXINE SODIUM 25 MCG PO TABS
68.5000 ug | ORAL_TABLET | ORAL | Status: DC
  Administered 2020-12-23 – 2021-01-06 (×3): 68.5 ug via ORAL
  Filled 2020-12-22 (×5): qty 1

## 2020-12-22 MED ORDER — APIXABAN 5 MG PO TABS
5.0000 mg | ORAL_TABLET | Freq: Two times a day (BID) | ORAL | Status: DC
  Administered 2020-12-22 – 2021-01-09 (×36): 5 mg via ORAL
  Filled 2020-12-22 (×36): qty 1

## 2020-12-22 MED ORDER — MUPIROCIN 2 % EX OINT
1.0000 "application " | TOPICAL_OINTMENT | Freq: Two times a day (BID) | CUTANEOUS | Status: AC
  Administered 2020-12-22 – 2020-12-23 (×2): 1 via NASAL
  Filled 2020-12-22: qty 22

## 2020-12-22 MED ORDER — LEVOTHYROXINE SODIUM 25 MCG PO TABS
137.0000 ug | ORAL_TABLET | ORAL | Status: DC
  Administered 2020-12-24 – 2021-01-09 (×13): 137 ug via ORAL
  Filled 2020-12-22 (×14): qty 1

## 2020-12-22 MED ORDER — ALUM & MAG HYDROXIDE-SIMETH 200-200-20 MG/5ML PO SUSP
30.0000 mL | ORAL | Status: DC | PRN
  Administered 2021-01-03 (×2): 30 mL via ORAL
  Filled 2020-12-22 (×2): qty 30

## 2020-12-22 NOTE — Progress Notes (Signed)
Inpatient Rehabilitation Admissions Coordinator  I met with patient at bedside an spoke with her sister, Mary/POA by phone. All in agreement to admit to CIR. I have contacted acute team and TOC. I will make the arrangements to admit today.   , RN, MSN Rehab Admissions Coordinator [phone] 12/22/2020 12:32 PM  

## 2020-12-22 NOTE — Care Management Important Message (Signed)
Important Message  Patient Details  Name: Kayla Harrison MRN: [id number] Date of Birth: [birth year]   Medicare Important Message Given:  Yes - Important Message mailed due to current National Emergency   Verbal consent obtained due to current National Emergency  Relationship to patient: Self Contact Name: [place] Call Date: 12/22/20  Time: 1053 Phone: [phone] Outcome: Spoke with contact Important Message mailed to: Patient address on file    Delorse Lek 12/22/2020, 10:54 AM

## 2020-12-22 NOTE — Progress Notes (Addendum)
Physical Therapy Treatment Patient Details Name: Kayla Harrison MRN: [id number] DOB: [birth year] Today's Date: 12/22/2020    History of Present Illness Pt is a 75 y.o. female admitted on 12/15/20 presenting with a fall.  Patient has been very unsteady.  Multiple falls over the last several weeks.  Presents with painful hip, and non displaced fxs to L elbow, and  R ribs. Large hematoma over left hip that is now s/p hematoma evacuation on 12/19/20 with wound vac placement.  Syncopal event with PT on 12/20/20.  PMH: thyroid cancer/lung cancer/NHL with h/o SVC syndrome; CVA; pacemaker placement; afib on Eliquis; obesity s/p lap band surgery; hypothyroidism; HTN; and HLD.    PT Comments    Pt supine in bed on arrival this session.  Nursing present and requesting orthostatic vitals.  Unable to complete due to increased dizziness in sitting.  Pt returned to supine and focused on LE strengthening.  Pt eager to mobilize but feel pretty terrible today.  Plan for blood transfusion and transfer to CIR.  Rehab MD in room and reports it is okay to remove her ace wrap from her L thigh.  Pt noted with pressure related breakdown from wrap and reports relief after wrap is removed.  Pt eager to transfer to rehab.    Orthostatic Vitals: Supine: 104/53 Sitting: 70/55 Sitting after 2 min: 79/66 Supine:130/57  Unable to obtain reading in standing due to patient response to sitting.      Follow Up Recommendations  CIR;Supervision/Assistance - 24 hour     Equipment Recommendations  None recommended by PT    Recommendations for Other Services       Precautions / Restrictions Precautions Precautions: Fall Restrictions Weight Bearing Restrictions: Yes LUE Weight Bearing: Weight bearing as tolerated Other Position/Activity Restrictions: assume NWB given splint.  Sling for comfort and mobility.    Mobility  Bed Mobility Overal bed mobility: Needs Assistance Bed Mobility: Supine to Sit;Sit to Supine      Supine to sit: Mod assist Sit to supine: Mod assist;+2 for physical assistance   General bed mobility comments: Pt supine in bed on arrival.  Performed supine to sit for orthostatic reading.  Pt sat edge of bed and dizziness continued to increase so deferred standing orthostatic reading.    Transfers Overall transfer level:  (unable due to drop in BP with supine to sit.  returned back to bed and focused on LE strengthening.)                  Ambulation/Gait                 Stairs             Wheelchair Mobility    Modified Rankin (Stroke Patients Only)       Balance Overall balance assessment: Needs assistance   Sitting balance-Leahy Scale: Fair Sitting balance - Comments: mildly unsteady in sitting due to dizziness.                                    Cognition Arousal/Alertness: Awake/alert Behavior During Therapy: WFL for tasks assessed/performed Overall Cognitive Status: Within Functional Limits for tasks assessed                                        Exercises General Exercises - Lower Extremity  Ankle Circles/Pumps: AROM;Both;15 reps;Supine Quad Sets: AROM;Both;10 reps;Supine Heel Slides: AAROM;Both;10 reps;Supine Hip ABduction/ADduction: AAROM;Both;10 reps;Supine Straight Leg Raises: AAROM;Both;Supine;5 reps    General Comments        Pertinent Vitals/Pain Pain Assessment: Faces Faces Pain Scale: Hurts even more Pain Location: L hip, L lower aspect of rib cage. Pain Descriptors / Indicators: Sore;Aching Pain Intervention(s): Monitored during session;Repositioned    Home Living                      Prior Function            PT Goals (current goals can now be found in the care plan section) Acute Rehab PT Goals Patient Stated Goal: to return home, improve strength, stop falling Potential to Achieve Goals: Fair Progress towards PT goals: Progressing toward goals    Frequency    Min  4X/week      PT Plan Current plan remains appropriate    Co-evaluation              AM-PAC PT "6 Clicks" Mobility   Outcome Measure  Help needed turning from your back to your side while in a flat bed without using bedrails?: A Little Help needed moving from lying on your back to sitting on the side of a flat bed without using bedrails?: A Little Help needed moving to and from a bed to a chair (including a wheelchair)?: A Lot Help needed standing up from a chair using your arms (e.g., wheelchair or bedside chair)?: A Lot Help needed to walk in hospital room?: A Lot Help needed climbing 3-5 steps with a railing? : Total 6 Click Score: 13    End of Session Equipment Utilized During Treatment: Gait belt Activity Tolerance: Patient limited by fatigue Patient left: in bed;with nursing/sitter in room;with call bell/phone within reach Nurse Communication: Mobility status PT Visit Diagnosis: Unsteadiness on feet (R26.81);Other abnormalities of gait and mobility (R26.89);Repeated falls (R29.6);Muscle weakness (generalized) (M62.81);Difficulty in walking, not elsewhere classified (R26.2);Pain Pain - Right/Left: Left Pain - part of body: Hip     Time: 6767-2094 PT Time Calculation (min) (ACUTE ONLY): 36 min  Charges:  $Therapeutic Exercise: 8-22 mins $Therapeutic Activity: 8-22 mins                     Erasmo Leventhal , PTA Acute Rehabilitation Services Pager [phone] Office [phone]     Kayla Harrison 12/22/2020, 1:57 PM

## 2020-12-22 NOTE — Progress Notes (Signed)
Pt states pain is 2/10, is resting quietly,call bell w/in reach.

## 2020-12-22 NOTE — Progress Notes (Addendum)
1645 Pt transferred to 4W04. Report was given to [facility]. Blood transfusion running.

## 2020-12-22 NOTE — Discharge Summary (Signed)
Physician Discharge Summary  Kayla Harrison CHE:[id number] DOB: Jun 19, 1946 DOA: 12/15/2020  PCP: [name], MD  Admit date: 12/15/2020 Discharge date: 12/22/2020  Admitted From: Home  Discharge disposition: CIR  Recommendations for Outpatient Follow-Up:   . Follow-up with orthopedic surgery and primary care physician as outpatient . Orthopedic surgery will need to follow-up for thigh hematoma status post evacuation. . Please monitor CBC daily and transfuse as necessary. Marland Kitchen Has been started back on anticoagulation due to her history of atrial fibrillation malignancy.  Will need to closely monitor hemoglobin. . Patient was mildly orthostatic in the hospital.  Will need orthostatic precautions. Continue TED hose and abdominal binder for orthostatic hypotension. . Continue left elbow splint, to follow-up with orthopedics as outpatient   Discharge Diagnosis:   Principal Problem:   Fall Active Problems:   HYPERLIPIDEMIA   Hypothyroidism   Non-Hodgkin lymphoma, unspecified, intrathoracic lymph nodes (HCC)   Persistent atrial fibrillation (HCC)   Pacemaker   Hip hematoma, left, initial encounter   Nondisplaced fracture of coronoid process of left ulna, initial encounter for closed fracture   Multiple rib fractures   Orthostatic hypotension   History of CVA (cerebrovascular accident)   Discharge Condition: Improved.  Diet recommendation: Low sodium, heart healthy.    Wound care: Local hematoma care.  Code status: Full.   History of Present Illness:   Patient is a 75 years old female with past medical history of thyroid cancer, lung cancer non-Hodgkin's lymphoma, CVA, history of atrial fibrillation on Eliquis, obesity status post lap band surgery, hypothyroidism, hyperlipidemia and hypertension who presented to hospital after sustaining a fall which was thought to be orthostatic in nature.  This was followed by a large hematoma on the left hip.  Patient underwent  evacuation of the hematoma on 12/19/2020 with improvement in her symptoms.  Hospital Course:   Following conditions were addressed during hospitalization as listed below,  Falls suspected due to orthostatic hypotension Patient with history of recurrent falls.  Patient follows up with Dr. Caryl Comes cardiology as outpatient and was seen by Dr.klein here in the hospital.  Cardiology had recommended TED hose, Florinef and abdominal binder.  Florinef dose has been increased by Dr. Caryl Comes .  Physical therapy on board and patient did experience syncopal event yesterday.    PT on board and recommend CIR placement.  Constipation.    Received bowel regimen in the hospital.  Continue bowel regimen due to being on narcotics  Persistent atrial fibrillation. Evacuation of the hematoma on 12/19/2020.  Was put on heparin drip during hospitalization due to significant risk thrombosis, previous history of atrial fibrillation and malignancy.  Continue  amiodarone.  History of pacemaker.    Left hip hematoma. Status post hematoma evacuation.  Eliquis on hold.  Currently on heparin drip.  We will continue to monitor hemoglobin.  Latest hemoglobin of 7.2.  Patient does have a borderline low blood pressure and with low hemoglobin will transfuse 1 unit.  Platelet count has improved to 202.Marland Kitchen  Left elbow fracture Status post splint with outpatient plan for Ortho follow-up.  Supportive care.  Possible R rib fractures -Xray with lateral R 7/8 rib fractures which were not seen on CT. Was on lidocaine patch and supportive care.    Continue Lidoderm patch.  Continue incentive spirometry.  Hyperlipidemia Continue statins  Hypothyroidism TSH within normal limits.  Continue Synthroid  Class 1 obesity -Body mass index is 32.93 kg/m. status post laparoscopic banding surgery.  History of non-Hodgkin's lymphoma Seen by  Dr. Lindi Adie as outpatient. History of lung mass, thyroid cancer.  Disposition.  At this time,  patient is stable for disposition to CIR.  Medical Consultants:    Orthopedics  Cardiology  Procedures:     Evacuation of the left lateral hip hematoma on 12/19/2020  Subjective:   Today, patient was seen and examined at bedside.  Complains of mild hip pain.  Denies any nausea vomiting dizziness lightheadedness or shortness of breath.  Discharge Exam:   Vitals:   12/22/20 0549 12/22/20 0815  BP: 111/67 (!) 97/50  Pulse: 66 70  Resp: 16 17  Temp: 98.6 F (37 C) 98.4 F (36.9 C)  SpO2: 97% 97%   Vitals:   12/21/20 1743 12/21/20 2255 12/22/20 0549 12/22/20 0815  BP: (!) 103/52 (!) 96/57 111/67 (!) 97/50  Pulse: 69 72 66 70  Resp: 16 16 16 17   Temp: 98.9 F (37.2 C) 98.4 F (36.9 C) 98.6 F (37 C) 98.4 F (36.9 C)  TempSrc: Oral Oral Oral Oral  SpO2: 98% 96% 97% 97%  Weight:      Height:       General: Alert awake, not in obvious distress, obese, HENT: pupils equally reacting to light,  No scleral pallor or icterus noted. Oral mucosa is moist.  Chest:  Clear breath sounds.  Diminished breath sounds bilaterally. No crackles or wheezes.  CVS: S1 &S2 heard. No murmur.  Irregular rhythm Abdomen: Soft, nontender, nondistended.  Bowel sounds are heard.   Extremities: No cyanosis, clubbing or edema.  Left hip with compression bandage. Psych: Alert, awake and oriented, normal mood CNS:  No cranial nerve deficits.  Power equal in all extremities.   Skin: Warm and dry.  Left hip with compression bandage, right arm ecchymosis.  The results of significant diagnostics from this hospitalization (including imaging, microbiology, ancillary and laboratory) are listed below for reference.     Diagnostic Studies:   DG Ribs Unilateral W/Chest Right  Result Date: 12/15/2020 CLINICAL DATA:  Fall. EXAM: RIGHT RIBS AND CHEST - 3+ VIEW COMPARISON:  10/12/2020 FINDINGS: Cardiomegaly. Dual-chamber pacer leads in place. Left atrial clipping. Interstitial prominence which is stable.  There is paramediastinal scarring on a 2021 chest CT. No hemothorax or pneumothorax. Lateral right seventh and eighth rib fractures which are nondisplaced and marked on the images. Reverse glenohumeral arthroplasty on the right. Lap band. Postoperative thoracic inlet and left breast. IMPRESSION: Nondisplaced right seventh and eighth rib fractures. No hemothorax or pneumothorax. Electronically Signed   By: Monte Fantasia M.D.   On: 12/15/2020 06:52   DG Elbow Complete Left  Result Date: 12/15/2020 CLINICAL DATA:  Fall EXAM: LEFT ELBOW - COMPLETE 3+ VIEW COMPARISON:  None. FINDINGS: The osseous structures appear diffusely demineralized which may limit detection of small or nondisplaced fractures. Nondisplaced fracture through the coronoid process. Questionable lucency through the lateral aspect of the radial head. Degenerative changes about the elbow with enthesopathic changes on the medial epicondyle. Vascular calcium the soft tissues. Soft tissue swelling of the elbow is present. IMPRESSION: 1. Nondisplaced fracture through the coronoid process. 2. Questionable lucency versus projection over the lateral aspect of the radial head, correlate for point tenderness to exclude acute fracture. 3. Degenerative changes about the elbow with enthesopathic changes on the medial epicondyle. 4. Minimal soft tissue swelling, no sizeable effusion. Electronically Signed   By: Lovena Le M.D.   On: 12/15/2020 06:57   CT Head Wo Contrast  Result Date: 12/15/2020 CLINICAL DATA:  Fall, on anticoagulation EXAM: CT  HEAD WITHOUT CONTRAST CT CERVICAL SPINE WITHOUT CONTRAST TECHNIQUE: Multidetector CT imaging of the head and cervical spine was performed following the standard protocol without intravenous contrast. Multiplanar CT image reconstructions of the cervical spine were also generated. COMPARISON:  MR head and cervical spine 07/29/2018, CT head and cervical spine 12/02/2018 FINDINGS: CT HEAD FINDINGS Brain: No evidence of  acute infarction, hemorrhage, hydrocephalus, extra-axial collection, visible mass lesion or mass effect. Symmetric prominence of the ventricles, cisterns and sulci compatible with parenchymal volume loss. Mixed patchy and confluent areas of white matter hypoattenuation are most compatible with chronic microvascular angiopathy. Vascular: Atherosclerotic calcification of the carotid siphons and intradural vertebral arteries. No hyperdense vessel. Skull: No calvarial fracture or suspicious osseous lesion. No scalp swelling or hematoma. Sinuses/Orbits: Paranasal sinuses and mastoid air cells are predominantly clear. Orbital structures are unremarkable aside from prior lens extractions. Other: Bilateral TMJ arthrosis, right greater than left. CT CERVICAL SPINE FINDINGS Alignment: Stabilization collar in place at the time of exam. Straightening of the normal cervical lordosis. Focal reversal noted at the C5-6 level. No evidence of traumatic listhesis. No abnormally widened, perched or jumped facets. Normal alignment of the craniocervical and atlantoaxial articulations accounting for cranial positioning and atlantodental arthrosis. Skull base and vertebrae: No acute skull base fracture. No vertebral body fracture or height loss. The osseous structures appear diffusely demineralized which may limit detection of small or nondisplaced fractures. Stable appearance of slightly more focal lucency in the C3 and T1 posterosuperior corner, likely vertebral body hemangiomata or demineralization without concerning features on comparison MR imaging. Moderate arthrosis at the atlantodental and basion dens intervals, stable prior. Multilevel cervical spondylitic changes, as detailed below. Soft tissues and spinal canal: No pre or paravertebral fluid or swelling. No visible canal hematoma. Airways patent. Disc levels: Multilevel intervertebral disc height loss with spondylitic endplate changes. Features maximal C5-C7 with larger disc  osteophyte complexes at the C5-6, C6-7 levels resulting and moderate canal stenoses. Additional uncinate spurring facet hypertrophic changes throughout the cervical spine result in mild multilevel neural foraminal narrowing. Upper chest: Biapical pleuroparenchymal scarring is noted. Postsurgical changes at the base of the neck likely reflecting prior thyroidectomy. Calcification nodes. Other: None. IMPRESSION: 1. No acute intracranial abnormality. No scalp swelling or calvarial fracture. 2. Stable parenchymal volume loss and chronic microvascular ischemic white matter disease. 3. No acute cervical spine fracture or traumatic listhesis. 4. Multilevel cervical spondylitic and facet degenerative changes, maximal C5-C7 where there is moderate canal stenoses and mild multilevel neural foraminal narrowing. 5. Stable appearance of slightly more focal lucency in the C3 and T1 posterosuperior corner, likely vertebral body hemangiomata or demineralization without concerning features on interval comparison MR imaging. Electronically Signed   By: Lovena Le M.D.   On: 12/15/2020 06:36   CT Chest Wo Contrast  Result Date: 12/15/2020 CLINICAL DATA:  Rib fractures.  History of non-Hodgkin's lymphoma. EXAM: CT CHEST WITHOUT CONTRAST TECHNIQUE: Multidetector CT imaging of the chest was performed following the standard protocol without IV contrast. COMPARISON:  August 21, 2020. FINDINGS: Cardiovascular: Atherosclerosis of thoracic aorta is noted without aneurysm formation. Normal cardiac size. No pericardial effusion. Left-sided pacemaker is in grossly good position. Coronary artery calcifications are noted. Mediastinum/Nodes: Small sliding-type hiatal hernia. Thyroid gland is unremarkable. No definite adenopathy is noted, although evaluation is significantly limited due the lack of intravenous contrast. Lungs/Pleura: No pneumothorax or significant pleural effusion is noted. Mild bilateral posterior basilar subsegmental  atelectasis is noted. Probable scarring or fibrosis is noted in the right upper  lobe. Upper Abdomen: Lap band device is noted. Bilateral nonobstructive nephrolithiasis is noted. Musculoskeletal: No chest wall mass or suspicious bone lesions identified. Status post right total shoulder arthroplasty. IMPRESSION: 1. No definite rib fracture is noted. 2. Coronary artery calcifications are noted suggesting coronary artery disease. 3. Small sliding-type hiatal hernia. 4. Bilateral nonobstructive nephrolithiasis. 5. Probable scarring or fibrosis is noted in the right upper lobe. 6. Aortic atherosclerosis. Aortic Atherosclerosis (ICD10-I70.0). Electronically Signed   By: Marijo Conception M.D.   On: 12/15/2020 09:28   CT Cervical Spine Wo Contrast  Result Date: 12/15/2020 CLINICAL DATA:  Fall, on anticoagulation EXAM: CT HEAD WITHOUT CONTRAST CT CERVICAL SPINE WITHOUT CONTRAST TECHNIQUE: Multidetector CT imaging of the head and cervical spine was performed following the standard protocol without intravenous contrast. Multiplanar CT image reconstructions of the cervical spine were also generated. COMPARISON:  MR head and cervical spine 07/29/2018, CT head and cervical spine 12/02/2018 FINDINGS: CT HEAD FINDINGS Brain: No evidence of acute infarction, hemorrhage, hydrocephalus, extra-axial collection, visible mass lesion or mass effect. Symmetric prominence of the ventricles, cisterns and sulci compatible with parenchymal volume loss. Mixed patchy and confluent areas of white matter hypoattenuation are most compatible with chronic microvascular angiopathy. Vascular: Atherosclerotic calcification of the carotid siphons and intradural vertebral arteries. No hyperdense vessel. Skull: No calvarial fracture or suspicious osseous lesion. No scalp swelling or hematoma. Sinuses/Orbits: Paranasal sinuses and mastoid air cells are predominantly clear. Orbital structures are unremarkable aside from prior lens extractions. Other:  Bilateral TMJ arthrosis, right greater than left. CT CERVICAL SPINE FINDINGS Alignment: Stabilization collar in place at the time of exam. Straightening of the normal cervical lordosis. Focal reversal noted at the C5-6 level. No evidence of traumatic listhesis. No abnormally widened, perched or jumped facets. Normal alignment of the craniocervical and atlantoaxial articulations accounting for cranial positioning and atlantodental arthrosis. Skull base and vertebrae: No acute skull base fracture. No vertebral body fracture or height loss. The osseous structures appear diffusely demineralized which may limit detection of small or nondisplaced fractures. Stable appearance of slightly more focal lucency in the C3 and T1 posterosuperior corner, likely vertebral body hemangiomata or demineralization without concerning features on comparison MR imaging. Moderate arthrosis at the atlantodental and basion dens intervals, stable prior. Multilevel cervical spondylitic changes, as detailed below. Soft tissues and spinal canal: No pre or paravertebral fluid or swelling. No visible canal hematoma. Airways patent. Disc levels: Multilevel intervertebral disc height loss with spondylitic endplate changes. Features maximal C5-C7 with larger disc osteophyte complexes at the C5-6, C6-7 levels resulting and moderate canal stenoses. Additional uncinate spurring facet hypertrophic changes throughout the cervical spine result in mild multilevel neural foraminal narrowing. Upper chest: Biapical pleuroparenchymal scarring is noted. Postsurgical changes at the base of the neck likely reflecting prior thyroidectomy. Calcification nodes. Other: None. IMPRESSION: 1. No acute intracranial abnormality. No scalp swelling or calvarial fracture. 2. Stable parenchymal volume loss and chronic microvascular ischemic white matter disease. 3. No acute cervical spine fracture or traumatic listhesis. 4. Multilevel cervical spondylitic and facet degenerative  changes, maximal C5-C7 where there is moderate canal stenoses and mild multilevel neural foraminal narrowing. 5. Stable appearance of slightly more focal lucency in the C3 and T1 posterosuperior corner, likely vertebral body hemangiomata or demineralization without concerning features on interval comparison MR imaging. Electronically Signed   By: Lovena Le M.D.   On: 12/15/2020 06:36   CT Hip Left Wo Contrast  Result Date: 12/15/2020 CLINICAL DATA:  [place].  Left hip pain. EXAM:  CT OF THE LEFT HIP WITHOUT CONTRAST TECHNIQUE: Multidetector CT imaging of the left hip was performed according to the standard protocol. Multiplanar CT image reconstructions were also generated. COMPARISON:  Radiographs 12/15/2020 FINDINGS: The left hip is normally located. No acute hip fracture is identified. There are moderate degenerative changes. No evidence of AVN. The left hemipelvic bony structures are intact. No pelvic fractures. There is a large subcutaneous hematoma overlying the left hip. I do not see an obvious muscle tear or intramuscular hematoma. No significant left-sided intrapelvic abnormalities are identified. Incidental note is made of diverticulosis and moderate to advanced vascular calcifications. IMPRESSION: 1. Large subcutaneous hematoma overlying the left hip. 2. No acute hip or left-sided pelvic fractures. 3. Moderate degenerative changes involving the left hip. 4. Aortic atherosclerosis. Aortic Atherosclerosis (ICD10-I70.0). Electronically Signed   By: Marijo Sanes M.D.   On: 12/15/2020 09:19   DG Hip Unilat W or Wo Pelvis 2-3 Views Left  Result Date: 12/15/2020 CLINICAL DATA:  Left hip and elbow pain due to fall EXAM: DG HIP (WITH OR WITHOUT PELVIS) 2-3V LEFT COMPARISON:  None. FINDINGS: Lateral left hip swelling and/or hematoma. The osseous structures appear diffusely demineralized which may limit detection of small or nondisplaced fractures. No acute bony abnormality. Specifically, no fracture,  subluxation, or dislocation. Degenerative changes present in the lower lumbar spine, bilateral hips and pelvis. Extensive atherosclerotic calcifications throughout the abdomen pelvis. Catheter tubing projects over the mid abdomen. IMPRESSION: Lateral left hip swelling and/or hematoma. No acute osseous abnormality. Degenerative changes in the spine, hips and pelvis. Electronically Signed   By: Lovena Le M.D.   On: 12/15/2020 06:53     Labs:   Basic Metabolic Panel: Recent Labs  Lab 12/17/20 0122 12/18/20 0756 12/20/20 0650 12/21/20 0423 12/22/20 0302  NA 136 136 137 138 137  K 4.5 4.4 4.8 4.5 4.6  CL 103 103 104 105 104  CO2 28 28 28 28 27   GLUCOSE 118* 109* 142* 114* 117*  BUN 25* 20 20 25* 25*  CREATININE 1.03* 0.91 0.84 1.06* 0.93  CALCIUM 8.3* 8.3* 8.2* 8.0* 8.2*  MG  --   --   --  1.9 1.9  PHOS  --   --   --  3.0  --    GFR Estimated Creatinine Clearance: 59 mL/min (by C-G formula based on SCr of 0.93 mg/dL). Liver Function Tests: Recent Labs  Lab 12/21/20 0423  AST 54*  ALT 18  ALKPHOS 50  BILITOT 1.0  PROT 4.8*  ALBUMIN 2.1*   No results for input(s): LIPASE, AMYLASE in the last 168 hours. No results for input(s): AMMONIA in the last 168 hours. Coagulation profile No results for input(s): INR, PROTIME in the last 168 hours.  CBC: Recent Labs  Lab 12/18/20 0756 12/19/20 0314 12/20/20 0650 12/21/20 0423 12/22/20 0302  WBC 8.8 8.8 14.1* 10.3 12.0*  HGB 9.7* 9.5* 8.3* 7.4* 7.2*  HCT 29.7* 30.1* 25.7* 23.6* 23.1*  MCV 92.2 94.7 93.8 98.7 95.9  PLT 151 146* 179 184 202   Cardiac Enzymes: No results for input(s): CKTOTAL, CKMB, CKMBINDEX, TROPONINI in the last 168 hours. BNP: Invalid input(s): POCBNP CBG: No results for input(s): GLUCAP in the last 168 hours. D-Dimer No results for input(s): DDIMER in the last 72 hours. Hgb A1c No results for input(s): HGBA1C in the last 72 hours. Lipid Profile No results for input(s): CHOL, HDL, LDLCALC, TRIG,  CHOLHDL, LDLDIRECT in the last 72 hours. Thyroid function studies No results for input(s):  TSH, T4TOTAL, T3FREE, THYROIDAB in the last 72 hours.  Invalid input(s): FREET3 Anemia work up No results for input(s): VITAMINB12, FOLATE, FERRITIN, TIBC, IRON, RETICCTPCT in the last 72 hours. Microbiology Recent Results (from the past 240 hour(s))  Resp Panel by RT-PCR (Flu A&B, Covid) Nasopharyngeal Swab     Status: None   Collection Time: 12/15/20  8:44 AM   Specimen: Nasopharyngeal Swab; Nasopharyngeal(NP) swabs in vial transport medium  Result Value Ref Range Status   SARS Coronavirus 2 by RT PCR NEGATIVE NEGATIVE Final    Comment: (NOTE) SARS-CoV-2 target nucleic acids are NOT DETECTED.  The SARS-CoV-2 RNA is generally detectable in upper respiratory specimens during the acute phase of infection. The lowest concentration of SARS-CoV-2 viral copies this assay can detect is 138 copies/mL. A negative result does not preclude SARS-Cov-2 infection and should not be used as the sole basis for treatment or other patient management decisions. A negative result may occur with  improper specimen collection/handling, submission of specimen other than nasopharyngeal swab, presence of viral mutation(s) within the areas targeted by this assay, and inadequate number of viral copies(<138 copies/mL). A negative result must be combined with clinical observations, patient history, and epidemiological information. The expected result is Negative.  Fact Sheet for Patients:  EntrepreneurPulse.com.au  Fact Sheet for Healthcare Providers:  IncredibleEmployment.be  This test is no t yet approved or cleared by the [place] FDA and  has been authorized for detection and/or diagnosis of SARS-CoV-2 by FDA under an Emergency Use Authorization (EUA). This EUA will remain  in effect (meaning this test can be used) for the duration of the COVID-19 declaration under Section  564(b)(1) of the Act, 21 U.S.C.section 360bbb-3(b)(1), unless the authorization is terminated  or revoked sooner.       Influenza A by PCR NEGATIVE NEGATIVE Final   Influenza B by PCR NEGATIVE NEGATIVE Final    Comment: (NOTE) The Xpert Xpress SARS-CoV-2/FLU/RSV plus assay is intended as an aid in the diagnosis of influenza from Nasopharyngeal swab specimens and should not be used as a sole basis for treatment. Nasal washings and aspirates are unacceptable for Xpert Xpress SARS-CoV-2/FLU/RSV testing.  Fact Sheet for Patients: EntrepreneurPulse.com.au  Fact Sheet for Healthcare Providers: IncredibleEmployment.be  This test is not yet approved or cleared by the [place] FDA and has been authorized for detection and/or diagnosis of SARS-CoV-2 by FDA under an Emergency Use Authorization (EUA). This EUA will remain in effect (meaning this test can be used) for the duration of the COVID-19 declaration under Section 564(b)(1) of the Act, 21 U.S.C. section 360bbb-3(b)(1), unless the authorization is terminated or revoked.  Performed at [facility], [place] [address] [place], [place] [address]   Surgical PCR screen     Status: None   Collection Time: 12/19/20  3:16 AM   Specimen: Nasal Mucosa; Nasal Swab  Result Value Ref Range Status   MRSA, PCR NEGATIVE NEGATIVE Final   Staphylococcus aureus NEGATIVE NEGATIVE Final    Comment: Performed at [facility], [place] [address] [place], [place] [address]     Discharge Instructions:   Discharge Instructions    Diet general   Complete by: As directed    Discharge instructions   Complete by: As directed    Follow-up with primary care physician and orthopedic surgery as outpatient.  Continue rehabilitation as per rehab team.   Discharge wound care:   Complete by: As directed    Hip site hematoma care.   Increase  activity slowly   Complete by: As directed      Allergies as of  12/22/2020      Reactions   Dofetilide Other (See Comments)   Prolonged QT interval   Ranolazine    Balance issues   Rivaroxaban Other (See Comments)   Nose Bleed X 6 hrs ; packing in ER Nasal hemorrhage   Rivaroxaban    Nose Bleed X 6 hrs ; packing in ER Nasal hemorrhage   Tape Other (See Comments)   SKIN IS THIN AND TEARS EASILY   Tikosyn [dofetilide] Other (See Comments)   "Long QT wave"   Epinephrine Other (See Comments)   Oral anesthetic Dental form only (liquid). Patient stated she will become "out of it" she can hear you but cannot respond in normal fashion. "not fully with it"   Epinephrine    Ketamine Other (See Comments)   Hallucinations   Adhesive [tape] Rash   Paper tape as well   Ketamine Other (See Comments)   HALLUCINATIONS      Medication List    TAKE these medications   acetaminophen 500 MG tablet Commonly known as: TYLENOL Take 500-1,000 mg by mouth every 6 (six) hours as needed for mild pain or headache.   amiodarone 200 MG tablet Commonly known as: PACERONE Take 1 tablet (200 mg total) by mouth daily.   bisacodyl 5 MG EC tablet Commonly known as: DULCOLAX Take 1 tablet (5 mg total) by mouth daily as needed for moderate constipation.   calcium-vitamin D 500-200 MG-UNIT tablet Commonly known as: OSCAL WITH D Take 2 tablets by mouth daily with breakfast. What changed: how much to take   docusate sodium 100 MG capsule Commonly known as: COLACE Take 1 capsule (100 mg total) by mouth 2 (two) times daily.   Eliquis 5 MG Tabs tablet Generic drug: apixaban TAKE 1 TABLET BY MOUTH  TWICE DAILY What changed: how much to take   fludrocortisone 0.1 MG tablet Commonly known as: FLORINEF Take 2 tablets (0.2 mg total) by mouth 2 (two) times daily.   furosemide 20 MG tablet Commonly known as: LASIX Take 20 mg by mouth daily as needed for fluid or edema.   levothyroxine 137 MCG tablet Commonly known as: SYNTHROID Take 68.5-137 mcg by mouth See admin  instructions. Taking 137 mcg tablet daily except on Saturday taking 1/2 tablet   lidocaine 5 % Commonly known as: LIDODERM Place 2 patches onto the skin daily. Remove & Discard patch within 12 hours or as directed by MD   Lifitegrast 5 % Soln Place 1 drop into both eyes at bedtime.   ondansetron 4 MG tablet Commonly known as: ZOFRAN Take 1 tablet (4 mg total) by mouth every 6 (six) hours as needed for nausea.   One-A-Day Womens 50+ Advantage Tabs Take 1 tablet by mouth daily with breakfast.   oxyCODONE-acetaminophen 5-325 MG tablet Commonly known as: Percocet Take 1 tablet by mouth every 4 (four) hours as needed for moderate pain or severe pain.   polyethylene glycol 17 g packet Commonly known as: MIRALAX / GLYCOLAX Take 17 g by mouth daily as needed for mild constipation.   rosuvastatin 10 MG tablet Commonly known as: CRESTOR Take 10 mg by mouth 2 (two) times a week. Takes on Monday and Thursday   senna 8.6 MG tablet Commonly known as: SENOKOT Take 1 tablet by mouth as needed for constipation.            Discharge Care Instructions  (From admission,  onward)         Start     Ordered   12/22/20 0000  Discharge wound care:       Comments: Hip site hematoma care.   12/22/20 1301            Time coordinating discharge: 39 minutes  Signed:  Olisa Quesnel  [facility] 12/22/2020, 1:02 PM

## 2020-12-22 NOTE — Progress Notes (Signed)
Pt  states she has   8/10   pain on left hip, 2 hrs after Oxy 10mg  dose. Gave Tylennol for break-thru pain, will re-chaeck @ 2130, when pt can have another dose of Oxy.

## 2020-12-22 NOTE — Progress Notes (Signed)
Pt has a blood-tinged urine, pharmacist notified. Message sent to Dr Louanne Belton. 1420 Blood transfusion started. Pt did not want to start taking Magnesium citrate until blood transfusion is done.

## 2020-12-22 NOTE — Progress Notes (Signed)
ANTICOAGULATION CONSULT NOTE - Follow Up Consult  Pharmacy Consult for Heparin Indication: atrial fibrillation  Allergies  Allergen Reactions  . Dofetilide Other (See Comments)    Prolonged QT interval   . Ranolazine     Balance issues  . Rivaroxaban Other (See Comments)    Nose Bleed X 6 hrs ; packing in ER Nasal hemorrhage  . Rivaroxaban     Nose Bleed X 6 hrs ; packing in ER Nasal hemorrhage  . Tape Other (See Comments)    SKIN IS THIN AND TEARS EASILY   . Tikosyn [Dofetilide] Other (See Comments)    "Long QT wave"   . Epinephrine Other (See Comments)    Oral anesthetic Dental form only (liquid). Patient stated she will become "out of it" she can hear you but cannot respond in normal fashion. "not fully with it"  . Epinephrine   . Ketamine Other (See Comments)    Hallucinations   . Adhesive [Tape] Rash    Paper tape as well  . Ketamine Other (See Comments)    HALLUCINATIONS    Patient Measurements: Height: 5\' 6"  (167.6 cm) Weight: 92.5 kg (204 lb) IBW/kg (Calculated) : 59.3 Heparin Dosing Weight:    Vital Signs: Temp: 98.6 F (37 C) (04/01 0549) Temp Source: Oral (04/01 0549) BP: 111/67 (04/01 0549) Pulse Rate: 66 (04/01 0549)  Labs: Recent Labs    12/20/20 0650 12/20/20 1659 12/21/20 0423 12/21/20 1245 12/21/20 2031 12/22/20 0302  HGB 8.3*  --  7.4*  --   --  7.2*  HCT 25.7*  --  23.6*  --   --  23.1*  PLT 179  --  184  --   --  202  HEPARINUNFRC 0.20*   < > 0.37 0.20* 0.48 0.56  CREATININE 0.84  --  1.06*  --   --  0.93   < > = values in this interval not displayed.    Estimated Creatinine Clearance: 59 mL/min (by C-G formula based on SCr of 0.93 mg/dL).   Assessment: 28 YOF on Apixaban PTA for hx Afib who presented with fall and was found to have elbow/rib fx, large hip hematoma. Heparin bridge started on 3/25. CHADS2VASC 7. - Will use low end goals and will not bolus. - 3/29: Approximately 500 cc of hematoma fluid was  evacuated.  Heparin level this morning is slightly SUPRAtherapeutic of lower goal range (HL 0.56 << 0.48, goal of 0.3-0.5). Hgb down to 7.2 << 7.4, will continue to monitor.   Goal of Therapy:  Heparin level 0.3-0.5 units/ml Monitor platelets by anticoagulation protocol: Yes   Plan:  - Reduce Heparin to 1150 units/hr (11.5 ml/hr) - Will follow-up on plans to resume [place] - Will continue to monitor for any signs/symptoms of bleeding and will follow up with heparin level in 8 hours   Thank you for allowing pharmacy to be a part of this patient's care.  Alycia Rossetti, PharmD, BCPS Clinical Pharmacist Clinical phone for 12/22/2020: [phone] 12/22/2020 7:22 AM   **Pharmacist phone directory can now be found on [email or web address] (PW [email or web address]).  Listed under [place].

## 2020-12-22 NOTE — Progress Notes (Addendum)
Patient Name: Kayla Harrison      SUBJECTIVE: Admitted with a fall fracture of her elbow, ribs.  Also bruised hip Orthostatic vital signs showed profound decrease in blood pressure 110--80 going from lying to sitting.  Unable to stand.   Drain out and modest hip pain Feels better   Not out of bed today  Not sure why acutely orthostatic--  Past Medical History:  Diagnosis Date  . Anemia   . Arthritis    osteoarthritis - knees and right shoulder  . Blood transfusion without reported diagnosis   . Breast cancer ([place])    Dr Margot Chimes, total thyroidectomy- 1999- for cancer  . 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 Chronic bilateral pleural effusions   . Colon polyp    Dr Earlean Shawl  . Complication of anesthesia    Ketamine produces LSD reaction, bright colored nightmarish experience   . Dyslipidemia   . Endometriosis   . Fibroids   . H/O pleural effusion    s/p thoracentesis w 3289ml withdrawn  . Hepatitis    [place]hepatitis   . History of dysphagia    due to radiation therapy  . History of hiatal hernia    small noted on PET scan  . Hypertension   . Hypothyroidism   . Lung cancer, lower lobe ([place]) 01/2017   radiation RX completed 03/04/17; will start chemo 6/27, pt unaware of lung cancer  . Morbid obesity ([place])    Status post lap band surgery  . Nephrolithiasis   . Non Hodgkin's lymphoma ([place])    on chemotherapy  . Persistent atrial fibrillation ([place])    a. s/p PVI 2008 b. s/p convergent ablation 2130 complicated by bradycardia requiring pacemaker implant  . Personal history of radiation therapy   . Presence of permanent cardiac pacemaker   . Rotator cuff tear    Right  . Stroke [facility])    2003- [place] x2  . SVC syndrome    with lung mass and non hodgkins lymphoma  . Thyroid cancer ([place]) 2000    Scheduled Meds:  Scheduled Meds: . [START ON 12/23/2020] amiodarone  200 mg Oral Daily  . apixaban  5 mg Oral BID  . fludrocortisone   0.2 mg Oral BID WC  . [START ON 12/24/2020] levothyroxine  137 mcg Oral Once per day on Sun Mon Tue Wed Thu Fri  . [START ON 12/23/2020] levothyroxine  68.5 mcg Oral Once per day on Sat  . lidocaine  2 patch Transdermal Q24H  . [START ON 12/23/2020] multivitamin with minerals  1 tablet Oral Daily  . mupirocin ointment  1 application Nasal BID  . [START ON 12/25/2020] rosuvastatin  10 mg Oral Once per day on Mon Thu  . [START ON 12/23/2020] senna  2 tablet Oral Daily   Continuous Infusions:  acetaminophen, alum & mag hydroxide-simeth, bisacodyl, diphenhydrAMINE, guaiFENesin-dextromethorphan, oxyCODONE, polyethylene glycol, prochlorperazine **OR** prochlorperazine **OR** prochlorperazine, sodium phosphate, traZODone    PHYSICAL EXAM Vitals:   12/22/20 1721  BP: (!) 94/49  Pulse: 72  Resp: 17  Temp: 99.5 F (37.5 C)   Well developed and nourished in no acute distress HENT normal Neck supple  Clear Regular rate and rhythm, no murmurs or gallops Abd-soft with active BS No Clubbing cyanosis edema Skin-warm and dry A & Oriented  Grossly normal sensory and motor function         No intake or output data in the 24 hours ending  12/22/20 1811  LABS: Basic Metabolic Panel: Recent Labs  Lab 12/16/20 0251 12/17/20 0122 12/18/20 0756 12/20/20 0650 12/21/20 0423 12/22/20 0302  NA 135 136 136 137 138 137  K 4.5 4.5 4.4 4.8 4.5 4.6  CL 100 103 103 104 105 104  CO2 27 28 28 28 28 27   GLUCOSE 94 118* 109* 142* 114* 117*  BUN 19 25* 20 20 25* 25*  CREATININE 0.99 1.03* 0.91 0.84 1.06* 0.93  CALCIUM 8.5* 8.3* 8.3* 8.2* 8.0* 8.2*  MG  --   --   --   --  1.9 1.9  PHOS  --   --   --   --  3.0  --    Cardiac Enzymes: No results for input(s): CKTOTAL, CKMB, CKMBINDEX, TROPONINI in the last 72 hours. CBC: Recent Labs  Lab 12/16/20 0251 12/17/20 0122 12/18/20 0756 12/19/20 0314 12/20/20 0650 12/21/20 0423 12/22/20 0302  WBC 6.3 8.0 8.8 8.8 14.1* 10.3 12.0*  HGB 12.1 10.7* 9.7* 9.5*  8.3* 7.4* 7.2*  HCT 38.5 32.6* 29.7* 30.1* 25.7* 23.6* 23.1*  MCV 94.1 91.3 92.2 94.7 93.8 98.7 95.9  PLT 173 172 151 146* 179 184 202   Thyroid Function Tests: No results for input(s): TSH, T4TOTAL, T3FREE, THYROIDAB in the last 72 hours.  Invalid input(s): FREET3 Anemia Panel: No results for input(s): VITAMINB12, FOLATE, FERRITIN, TIBC, IRON, RETICCTPCT in the last 72 hours.   Device Interrogation:* pending    ASSESSMENT AND PLAN:  Atrial fibrillation-persistent  Amiodarone therapy-longstanding  Sinus node dysfunction status post pacemaker  Orthostatic hypotension-profound  Falls-recurrent  Anemia  Still hypotensive  Will increase florinef>>0.2  Mg bid   Follow K   Anemic>>  Reached out to hospitalist re transfusion-- done  Now at rehab prob 2 weeks  Put bed in reverse trendelenburg  Out of bed as much as possible  With more hip pain and on anticoagulation will have to worry regarding recurrent hip bleeding  For now, with prior cva would keep on Apixoban  And amiodarone, although it can be assoc with Orthostatic hypotension    Signed, [name] MD  12/22/2020

## 2020-12-22 NOTE — Progress Notes (Signed)
Error

## 2020-12-22 NOTE — Progress Notes (Signed)
ANTICOAGULATION CONSULT NOTE - Follow Up Consult  Pharmacy Consult for Heparin >> Apixaban Indication: atrial fibrillation  Patient Measurements: Height: 5\' 6"  (167.6 cm) Weight: 92.5 kg (204 lb) IBW/kg (Calculated) : 59.3 Heparin Dosing Weight:    Vital Signs: Temp: 98.4 F (36.9 C) (04/01 0815) Temp Source: Oral (04/01 0815) BP: 97/50 (04/01 0815) Pulse Rate: 70 (04/01 0815)  Labs: Recent Labs    12/20/20 0650 12/20/20 1659 12/21/20 0423 12/21/20 1245 12/21/20 2031 12/22/20 0302  HGB 8.3*  --  7.4*  --   --  7.2*  HCT 25.7*  --  23.6*  --   --  23.1*  PLT 179  --  184  --   --  202  HEPARINUNFRC 0.20*   < > 0.37 0.20* 0.48 0.56  CREATININE 0.84  --  1.06*  --   --  0.93   < > = values in this interval not displayed.    Estimated Creatinine Clearance: 59 mL/min (by C-G formula based on SCr of 0.93 mg/dL).   Assessment: 65 YOF on Apixaban PTA for hx Afib who presented with fall and was found to have elbow/rib fx, large hip hematoma. Heparin bridge started on 3/25. CHADS2VASC 7. - Will use low end goals and will not bolus. - 3/29: Approximately 500 cc of hematoma fluid was evacuated.  Plan this afternoon is to transition back to Apixaban. Heparin drip has been d/ced. Will transition over this afternoon. RN reported some hematuria - making MD aware, will need to monitor.   Goal of Therapy:  Appropriate anticoagulation for indication and hepatic/renal function    Plan:  - D/c Heparin - Resume Apixaban 5 mg bid - appropriate for age/wt/renal fxn - On PTA, AVS in chart - Pharmacy will sign off of consult and monitor peripherally   Thank you for allowing pharmacy to be a part of this patient's care.  Alycia Rossetti, PharmD, BCPS Clinical Pharmacist Clinical phone for 12/22/2020: [phone] 12/22/2020 1:42 PM   **Pharmacist phone directory can now be found on [email or web address] ([email or web address]).  Listed under [place].

## 2020-12-22 NOTE — H&P (Addendum)
Physical Medicine and Rehabilitation Admission H&P     CC: Functional deficits   HPI: Kayla Harrison is a 75 year old female with history of Lung cancer, NHL w/ SVC syndrome, lap band surgery, A fib--on Eliquis, chronic LBP, CVA with expressive deficits  who was admitted on 12/15/20 after a fall with reports of HA, acute on chronic neck and back pain as well as increase in weakness. She was found to have non-displaced left coronoid process fracture with question of lucency over lateral aspect of radial head and large SQ hematoma left hip with moderate OA. Ortho evaluated patient and recommended WBAT left elbow and questioned need of I & D of left hip hematoma. Right rib pain treated with lidocaine patch.  Eliquis held and she was placed on heparin bridge.    She continued to have severe hip pain with significant edema and concerns of skin necrosis. MRI left hip showed large hematoma left hip 10 cm in size, partial thickness tear of anterosuperior labrum and low grade partial tears of bilateral hamstring tendon origin. She was taken to OR on 03/29 for I & D of latera hip hematoma by Dr. Lucia Gaskins.  Dr. Caryl Comes consulted for input on h/o recurrent falls and profound orthostatic hypotension with near syncope with activity. Florinef added and titrated upwards. Post op wound VAC removed 03/31 and wound closed. She has not had BM since surgery and Mag citrate added today.  She is WBAT and showing improvement in activity tolerance but continues to be limited by weakness with mild dizziness and HA. CIR recommended due to functional decline.       Review of Systems  Constitutional: Positive for malaise/fatigue. Negative for fever.  HENT: Negative for hearing loss.   Eyes: Negative for blurred vision.  Respiratory: Positive for shortness of breath.   Cardiovascular: Negative for chest pain and claudication.  Gastrointestinal: Positive for constipation. Negative for nausea and vomiting.  Genitourinary:  Positive for dysuria, frequency and hematuria.  Musculoskeletal: Positive for back pain, joint pain and myalgias.  Skin: Positive for itching.  Neurological: Positive for dizziness and weakness.  Psychiatric/Behavioral: Negative for depression and suicidal ideas.        Past Medical History:  Diagnosis Date  . Anemia    . Arthritis      osteoarthritis - knees and right shoulder  . Blood transfusion without reported diagnosis    . Breast cancer ([place])      Dr Margot Chimes, total thyroidectomy- 1999- for cancer  . 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 Chronic bilateral pleural effusions    . Colon polyp      Dr Earlean Shawl  . Complication of anesthesia      Ketamine produces LSD reaction, bright colored nightmarish experience   . Dyslipidemia    . Endometriosis    . Fibroids    . H/O pleural effusion      s/p thoracentesis w 3267ml withdrawn  . Hepatitis      [place] ?hepatitis   . History of dysphagia      due to radiation therapy  . History of hiatal hernia      small noted on PET scan  . Hypertension    . Hypothyroidism    . Lung cancer, lower lobe ([place]) 01/2017    radiation RX completed 03/04/17; will start chemo 6/27, pt unaware of lung cancer  . Morbid obesity ([place])      Status post lap band surgery  .  Nephrolithiasis    . Non Hodgkin's lymphoma ([place])      on chemotherapy  . Persistent atrial fibrillation ([place])      a. s/p PVI 2008 b. s/p convergent ablation 9211 complicated by bradycardia requiring pacemaker implant  . Personal history of radiation therapy    . Presence of permanent cardiac pacemaker    . Rotator cuff tear      Right  . Stroke [facility])      2003- [place] x2  . SVC syndrome      with lung mass and non hodgkins lymphoma  . Thyroid cancer ([place]) 2000           Past Surgical History:  Procedure Laterality Date  . ABDOMINAL HYSTERECTOMY   1983  . afib ablation        a. 2008 PVI b. 2014 convergent ablation  . APPENDECTOMY      . BONE  MARROW BIOPSY   02/21/2017  . BREAST LUMPECTOMY Left 2010  . bso   1998  . CARDIAC CATHETERIZATION        2015- negative  . CARDIOVERSION   10/09/2012    Procedure: CARDIOVERSION;  Surgeon: Minus Breeding, MD;  Location: [place];  Service: Cardiovascular;  Laterality: N/A;  . CARDIOVERSION   10/09/2012    Procedure: CARDIOVERSION;  Surgeon: Minus Breeding, MD;  Location: [facility] ENDOSCOPY;  Service: Cardiovascular;  Laterality: N/A;  [name] gave the ok to add pt to the add on , but we must check to find out if the can add pt on at [phone] ( [phone])  . CARDIOVERSION N/A 11/20/2012    Procedure: CARDIOVERSION;  Surgeon: Fay Records, MD;  Location: [place];  Service: Cardiovascular;  Laterality: N/A;  . CARDIOVERSION N/A 07/18/2017    Procedure: CARDIOVERSION;  Surgeon: Thayer Headings, MD;  Location[facility] ENDOSCOPY;  Service: Cardiovascular;  Laterality: N/A;  . CARDIOVERSION N/A 10/03/2017    Procedure: CARDIOVERSION;  Surgeon: Sanda Klein, MD;  Location: [facility] ENDOSCOPY;  Service: Cardiovascular;  Laterality: N/A;  . CARDIOVERSION N/A 01/07/2018    Procedure: CARDIOVERSION;  Surgeon: Thayer Headings, MD;  Location: [facility] ENDOSCOPY;  Service: Cardiovascular;  Laterality: N/A;  . CARDIOVERSION N/A 12/10/2019    Procedure: CARDIOVERSION;  Surgeon: Buford Dresser, MD;  Location: [facility] ENDOSCOPY;  Service: Cardiovascular;  Laterality: N/A;  . CHOLECYSTECTOMY      . COLONOSCOPY W/ POLYPECTOMY        Dr Earlean Shawl  . CYSTOSCOPY N/A 02/06/2015    Procedure: CYSTOSCOPY;  Surgeon: Kathie Rhodes, MD;  Location: [facility] ORS;  Service: Urology;  Laterality: N/A;  . CYSTOSCOPY W/ RETROGRADES Left 11/17/2017    Procedure: CYSTOSCOPY WITH RETROGRADE /PYELOGRAM/;  Surgeon: Kathie Rhodes, MD;  Location: [facility]S;  Service: Urology;  Laterality: Left;  . CYSTOSCOPY WITH RETROGRADE PYELOGRAM, URETEROSCOPY AND STENT PLACEMENT Right 02/06/2015    Procedure: RETROGRADE PYELOGRAM, RIGHT URETEROSCOPY STENT PLACEMENT;  Surgeon: Kathie Rhodes, MD;  Location: [facility]S;  Service: Urology;  Laterality: Right;  . CYSTOSCOPY WITH RETROGRADE PYELOGRAM, URETEROSCOPY AND STENT PLACEMENT Right 03/07/2017    Procedure: CYSTOSCOPY WITH RIGHT RETROGRADE PYELOGRAM,RIGHT  URETEROSCOPYLASER LITHOTRIPSY  AND STENT PLACEMENT AND STONE BASKETRY;  Surgeon: Kathie Rhodes, MD;  Location: [place];  Service: Urology;  Laterality: Right;  . EYE SURGERY        cataract surgery  . fatty mass removal   1999    pubic area  . HOLMIUM LASER APPLICATION N/A 9/41/7408    Procedure: HOLMIUM LASER APPLICATION;  Surgeon: Kathie Rhodes, MD;  Location: [facility];  Service: Urology;  Laterality: N/A;  . HOLMIUM LASER APPLICATION Right 7/41/2878    Procedure: HOLMIUM LASER APPLICATION;  Surgeon: Kathie Rhodes, MD;  Location: [facility];  Service: Urology;  Laterality: Right;  . HOLMIUM LASER APPLICATION Left 6/76/7209    Procedure: HOLMIUM LASER APPLICATION;  Surgeon: Kathie Rhodes, MD;  Location: [facility];  Service: Urology;  Laterality: Left;  . I & D EXTREMITY Left 12/19/2020    Procedure: IRRIGATION AND DEBRIDEMENT OF LEFT HIP HEMATOMA WITH APPLICATION OF WOUND VAC;  Surgeon: Erle Crocker, MD;  Location: [place];  Service: Orthopedics;  Laterality: Left;  . IR FLUORO GUIDE PORT INSERTION RIGHT   02/24/2017  . IR NEPHROSTOMY PLACEMENT RIGHT   11/17/2017  . IR PATIENT EVAL TECH 0-60 MINS   03/11/2017  . IR REMOVAL TUN ACCESS W/ PORT W/O FL MOD SED   04/20/2018  . IR US GUIDE VASC ACCESS RIGHT   02/24/2017  . KNEE ARTHROSCOPY        bilateral  . LAPAROSCOPIC GASTRIC BANDING   07/10/2010  . LAPAROSCOPIC GASTRIC BANDING        Laparoscopic adjustable banding APS System with posterior hiatal hernia, 2 suture.  Marland Kitchen LAPAROTOMY        for ruptured ovary and ovarian artery   . NEPHROLITHOTOMY Right 11/17/2017    Procedure: NEPHROLITHOTOMY PERCUTANEOUS;  Surgeon: Kathie Rhodes, MD;  [facility];  Service: Urology;  Laterality: Right;  .  PACEMAKER INSERTION   03/10/2013    MDT dual chamber PPM  . POCKET REVISION N/A 12/08/2013    Procedure: POCKET REVISION;  Surgeon: Deboraha Sprang, MD;  Location: [facility] CATH LAB;  Service: Cardiovascular;  Laterality: N/A;  . PORTA CATH INSERTION      . REVERSE SHOULDER ARTHROPLASTY Right 05/14/2018    Procedure: RIGHT REVERSE SHOULDER ARTHROPLASTY;  Surgeon: Tania Ade, MD;  Location: [place];  Service: Orthopedics;  Laterality: Right;  . REVERSE SHOULDER REPLACEMENT Right 05/14/2018  . RIGHT HEART CATH N/A 07/21/2019    Procedure: RIGHT HEART CATH;  Surgeon: Larey Dresser, MD;  Location: [place] CV LAB;  Service: Cardiovascular;  Laterality: N/A;  . TEE WITH CARDIOVERSION   09/22/2017  . TEE WITHOUT CARDIOVERSION N/A 10/03/2017    Procedure: TRANSESOPHAGEAL ECHOCARDIOGRAM (TEE);  Surgeon: Sanda Klein, MD;  Location: [facility] ENDOSCOPY;  Service: Cardiovascular;  Laterality: N/A;  . TEE WITHOUT CARDIOVERSION N/A 08/04/2019    Procedure: TRANSESOPHAGEAL ECHOCARDIOGRAM (TEE);  Surgeon: Larey Dresser, MD;  Location: [facility] ENDOSCOPY;  Service: Cardiovascular;  Laterality: N/A;  . TEE WITHOUT CARDIOVERSION N/A 12/10/2019    Procedure: TRANSESOPHAGEAL ECHOCARDIOGRAM (TEE);  Surgeon: Buford Dresser, MD;  Location: [facility] ENDOSCOPY;  Service: Cardiovascular;  Laterality: N/A;  . THYROIDECTOMY   1998    Dr Margot Chimes  . TONSILLECTOMY      . TOTAL KNEE ARTHROPLASTY   04/13/2012    Procedure: TOTAL KNEE ARTHROPLASTY;  Surgeon: Rudean Haskell, MD;  Location: [place];  Service: Orthopedics;  Laterality: Right;  Marland Kitchen VIDEO BRONCHOSCOPY WITH ENDOBRONCHIAL ULTRASOUND N/A 02/07/2017    Procedure: VIDEO BRONCHOSCOPY WITH ENDOBRONCHIAL ULTRASOUND;  Surgeon: Marshell Garfinkel, MD;  Location: [place];  Service: Pulmonary;  Laterality: N/A;           Family History  Problem Relation Age of Onset  . Heart disease Father 53        MI @autopsy   . Colon cancer Father  COLON  . Heart attack Father    . Other  Mother          temporal arteritis   . Diabetes Sister    . Diabetes Brother    . Diabetes Paternal Aunt    . Diabetes Paternal Grandmother    . Esophageal cancer Neg Hx    . Inflammatory bowel disease Neg Hx    . Liver disease Neg Hx    . Pancreatic cancer Neg Hx    . Stomach cancer Neg Hx        Social History:  reports that she has never smoked. She has never used smokeless tobacco. She reports that she does not drink alcohol and does not use drugs.          Allergies  Allergen Reactions  . Dofetilide Other (See Comments)      Prolonged QT interval    . Ranolazine        Balance issues  . Rivaroxaban Other (See Comments)      Nose Bleed X 6 hrs ; packing in ER Nasal hemorrhage  . Rivaroxaban        Nose Bleed X 6 hrs ; packing in ER Nasal hemorrhage  . Tape Other (See Comments)      SKIN IS THIN AND TEARS EASILY    . Tikosyn [Dofetilide] Other (See Comments)      "Long QT wave"    . Epinephrine Other (See Comments)      Oral anesthetic Dental form only (liquid). Patient stated she will become "out of it" she can hear you but cannot respond in normal fashion. "not fully with it"  . Epinephrine    . Ketamine Other (See Comments)      Hallucinations    . Adhesive [Tape] Rash      Paper tape as well  . Ketamine Other (See Comments)      HALLUCINATIONS             Facility-Administered Medications Prior to Admission  Medication Dose Route Frequency Provider Last Rate Last Admin  . sodium chloride flush (NS) 0.9 % injection 3 mL  3 mL Intravenous Q12H Deboraha Sprang, MD              Medications Prior to Admission  Medication Sig Dispense Refill  . acetaminophen (TYLENOL) 500 MG tablet Take 500-1,000 mg by mouth every 6 (six) hours as needed for mild pain or headache.      Marland Kitchen amiodarone (PACERONE) 200 MG tablet Take 1 tablet (200 mg total) by mouth daily. 90 tablet 3  . calcium-vitamin D (OSCAL WITH D) 500-200 MG-UNIT tablet Take 2 tablets by mouth daily with  breakfast. (Patient taking differently: Take 1 tablet by mouth daily with breakfast.) 180 tablet 1  . ELIQUIS 5 MG TABS tablet TAKE 1 TABLET BY MOUTH  TWICE DAILY (Patient taking differently: Take 5 mg by mouth 2 (two) times daily.) 180 tablet 2  . furosemide (LASIX) 20 MG tablet Take 20 mg by mouth daily as needed for fluid or edema.       Marland Kitchen levothyroxine (SYNTHROID) 137 MCG tablet Take 68.5-137 mcg by mouth See admin instructions. Taking 137 mcg tablet daily except on Saturday taking 1/2 tablet      . Lifitegrast 5 % SOLN Place 1 drop into both eyes at bedtime.       . Multiple Vitamins-Minerals (ONE-A-DAY WOMENS 50+ ADVANTAGE) TABS Take 1 tablet by mouth daily with breakfast.      .  rosuvastatin (CRESTOR) 10 MG tablet Take 10 mg by mouth 2 (two) times a week. Takes on Monday and Thursday      . senna (SENOKOT) 8.6 MG tablet Take 1 tablet by mouth as needed for constipation.          Drug Regimen Review  Drug regimen was reviewed and remains appropriate with no significant issues identified   Home: Home Living Family/patient expects to be discharged to:: Private residence Living Arrangements: Alone Available Help at Discharge: Friend(s),Available PRN/intermittently Type of Home: House Home Access: Level entry Home Layout: One level Bathroom Shower/Tub: Multimedia programmer: Handicapped height Home Equipment: Environmental consultant - 4 wheels,Shower seat - built in  Lives With: Alone   Functional History: Prior Function Level of Independence: Independent with assistive device(s) Comments: RW for mobility.  she has used a cane in the past.   Functional Status:  Mobility: Bed Mobility Overal bed mobility: Needs Assistance Bed Mobility: Supine to Sit,Sit to Supine Supine to sit: Min assist Sit to supine: Mod assist General bed mobility comments: Min assistance to rise into sitting and advance B LEs to edge of bed.  Pt with use of holding to PTA hands this session to pull trunk upright.   Pt sat edge of bed extended time and denies dizziness. Transfers Overall transfer level: Needs assistance Equipment used: Rolling walker (2 wheeled) Transfers: Sit to/from Stand Sit to Stand: Min assist,From elevated surface Stand pivot transfers: Min assist General transfer comment: Cues for hand placement to and from seated surface.  Pt denies dizziness in standing. Ambulation/Gait Ambulation/Gait assistance: Min assist,Mod assist Gait Distance (Feet): 6 Feet Assistive device: Rolling walker (2 wheeled) Gait Pattern/deviations: Step-to pattern,Trunk flexed,Decreased stride length General Gait Details: Pt performed short bout of gt training before requesting to sit and become non verbal with +syncopal episode.  PTA assisted patient to seated surface with mod-max assistance at edge of bed.  BP 106/66 after resting in modified supine position with trunk down and feet in dependent position.  Pt came to and PTA assisted from edge of bed to recliner. Gait velocity: decreased   ADL: ADL Overall ADL's : Needs assistance/impaired Eating/Feeding: Set up,Sitting Grooming: Wash/dry hands,Wash/dry face,Oral care,Set up,Sitting Upper Body Dressing : Moderate assistance,Sitting Lower Body Dressing: Moderate assistance,Sit to/from stand Toilet Transfer: Minimal Furniture conservator/restorer Details (indicate cue type and reason): bed>recliner on her left Functional mobility during ADLs: Minimal assistance General ADL Comments: HHA   Cognition: Cognition Overall Cognitive Status: Within Functional Limits for tasks assessed Arousal/Alertness: Awake/alert Orientation Level: Oriented X4 Attention: Sustained Sustained Attention: Appears intact Memory: Impaired Memory Impairment: Retrieval deficit Awareness: Appears intact Problem Solving: Appears intact Safety/Judgment: Appears intact Cognition Arousal/Alertness: Awake/alert Behavior During Therapy: WFL for tasks  assessed/performed Overall Cognitive Status: Within Functional Limits for tasks assessed     Blood pressure (!) 105/52, pulse 72, temperature 98.3 F (36.8 C), temperature source Oral, resp. rate 18, height 5\' 6"  (1.676 m), weight 92.5 kg, SpO2 97 %. Physical Exam Constitutional:      General: She is not in acute distress.    Appearance: She is obese.  HENT:     Head: Normocephalic and atraumatic.     Nose: Nose normal.     Mouth/Throat:     Mouth: Mucous membranes are moist.  Eyes:     Pupils: Pupils are equal, round, and reactive to light.  Cardiovascular:     Rate and Rhythm: Normal rate and regular rhythm.     Heart sounds: No murmur  heard. No gallop.   Pulmonary:     Effort: Pulmonary effort is normal. No respiratory distress.     Breath sounds: No wheezing.  Abdominal:     General: There is no distension.     Palpations: Abdomen is soft.     Tenderness: There is no abdominal tenderness.  Musculoskeletal:        General: Swelling and deformity present.     Cervical back: Normal range of motion.     Right lower leg: Edema present.     Left lower leg: Edema present.  Skin:    Findings: Bruising and erythema present.     Neurological:     Comments: Alert and oriented x 3. Normal insight and awareness. Intact Memory. Normal language and speech. Cranial nerve exam unremarkable. RUE 5/5 prox to distal. LUE 4-/5 with limitations at elbow d/t pain. LUE 2/5 prox to 4/5 distally d/t pain. RLE 3+ prox to 4+/5 distally. No focal sensory findings.   Psychiatric:        Mood and Affect: Mood normal.        Behavior: Behavior normal.        Lab Results Last 48 Hours        Results for orders placed or performed during the hospital encounter of 12/15/20 (from the past 48 hour(s))  Heparin level (unfractionated)     Status: Abnormal    Collection Time: 12/19/20  3:14 AM  Result Value Ref Range    Heparin Unfractionated 0.26 (L) 0.30 - 0.70 IU/mL      Comment: (NOTE) If  heparin results are below expected values, and patient dosage has  been confirmed, suggest follow up testing of antithrombin III levels. Performed at [facility], [place] [address], [place], [place] [address]    CBC     Status: Abnormal    Collection Time: 12/19/20  3:14 AM  Result Value Ref Range    WBC 8.8 4.0 - 10.5 K/uL    RBC 3.18 (L) 3.87 - 5.11 MIL/uL    Hemoglobin 9.5 (L) 12.0 - 15.0 g/dL    HCT 30.1 (L) 36.0 - 46.0 %    MCV 94.7 80.0 - 100.0 fL    MCH 29.9 26.0 - 34.0 pg    MCHC 31.6 30.0 - 36.0 g/dL    RDW 15.0 11.5 - 15.5 %    Platelets 146 (L) 150 - 400 K/uL    nRBC 0.0 0.0 - 0.2 %      Comment: Performed at [facility], [place] [address], [place], [place] [address]  TSH     Status: None    Collection Time: 12/19/20  3:14 AM  Result Value Ref Range    TSH 1.587 0.350 - 4.500 uIU/mL      Comment: Performed by a 3rd Generation assay with a functional sensitivity of <=0.01 uIU/mL. Performed at [facility], [place] [address], [place],  [address]    Surgical PCR screen     Status: None    Collection Time: 12/19/20  3:16 AM    Specimen: Nasal Mucosa; Nasal Swab  Result Value Ref Range    MRSA, PCR NEGATIVE NEGATIVE    Staphylococcus aureus NEGATIVE NEGATIVE      Comment: Performed at [facility], [place] [address], [place], [place] [address]  Heparin level (unfractionated)     Status: Abnormal    Collection Time: 12/20/20  6:50 AM  Result Value Ref Range  Heparin Unfractionated 0.20 (L) 0.30 - 0.70 IU/mL      Comment: (NOTE) If heparin results are below expected values, and patient dosage has  been confirmed, suggest follow up testing of antithrombin III levels. Performed at [facility], [place] [address], [place], [place] [address]    CBC     Status: Abnormal    Collection Time: 12/20/20  6:50 AM  Result Value Ref Range    WBC 14.1 (H) 4.0 - 10.5 K/uL    RBC 2.74 (L) 3.87 - 5.11 MIL/uL    Hemoglobin 8.3 (L) 12.0 - 15.0 g/dL    HCT  25.7 (L) 36.0 - 46.0 %    MCV 93.8 80.0 - 100.0 fL    MCH 30.3 26.0 - 34.0 pg    MCHC 32.3 30.0 - 36.0 g/dL    RDW 15.0 11.5 - 15.5 %    Platelets 179 150 - 400 K/uL    nRBC 0.0 0.0 - 0.2 %      Comment: Performed at [facility], [place] [address], [place], [place] [address]  Basic metabolic panel     Status: Abnormal    Collection Time: 12/20/20  6:50 AM  Result Value Ref Range    Sodium 137 135 - 145 mmol/L    Potassium 4.8 3.5 - 5.1 mmol/L    Chloride 104 98 - 111 mmol/L    CO2 28 22 - 32 mmol/L    Glucose, Bld 142 (H) 70 - 99 mg/dL      Comment: Glucose reference range applies only to samples taken after fasting for at least 8 hours.    BUN 20 8 - 23 mg/dL    Creatinine, Ser 0.84 0.44 - 1.00 mg/dL    Calcium 8.2 (L) 8.9 - 10.3 mg/dL    GFR, Estimated >60 >60 mL/min      Comment: (NOTE) Calculated using the CKD-EPI Creatinine Equation (2021)      Anion gap 5 5 - 15      Comment: Performed at [facility] [address], [place], [place] [address]    *Note: Due to a large number of results and/or encounters for the requested time period, some results have not been displayed. A complete set of results can be found in Results Review.       Imaging Results (Last 48 hours)  MR HIP LEFT WO CONTRAST   Result Date: 12/19/2020 CLINICAL DATA:  Fall, left hip pain with large subcutaneous hematoma EXAM: MR OF THE LEFT HIP WITHOUT CONTRAST TECHNIQUE: Multiplanar, multisequence MR imaging was performed. No intravenous contrast was administered. COMPARISON:  CT and x-ray 12/15/2020 FINDINGS: Bones: No acute fracture. No dislocation. No femoral head avascular necrosis. No bone marrow edema. No marrow replacing bone lesion. Articular cartilage and labrum Articular cartilage: Mild chondral thinning of the bilateral hip joints without focal cartilage defect. No subchondral marrow signal changes. Labrum: Partial-thickness tear of the anterosuperior labrum (series 11, images 9-10). Joint  or bursal effusion Joint effusion:  Trace bilateral joint effusions, nonspecific. Bursae: No abnormal bursal fluid collection. Muscles and tendons Muscles and tendons: Low-grade partial tears of the bilateral hamstring tendon origins. Remaining tendinous structures about the left hip are intact. Intramuscular edema within the distal left gluteus maximus, likely posttraumatic. Other findings Miscellaneous: Large complex fluid collection within the subcutaneous soft tissues overlying the posterolateral aspect of the left hip with layering hematocrit levels and areas of T1 intermediate signal compatible with hematoma. The full extent of  the collection was not entirely included within the field of view but measures at least 10 x 10 x 6 cm. Difficult to determine if the collection has significantly changed in size from the prior CT as the full extent of the collection was also not entirely included within the field of view. Extensive surrounding soft tissue edema. Colonic diverticulosis is noted. IMPRESSION: 1. Large hematoma within the subcutaneous soft tissues overlying the left hip measuring at least 10 cm in size. Difficult to determine if the collection has significantly changed in size from the prior CT as the full extent of the collection was also not entirely included within the field of view on either study. 2. No acute osseous abnormality of the left hip. 3. Mild left hip arthropathy with partial-thickness tear of the anterosuperior labrum. 4. Low-grade partial tears of the bilateral hamstring tendon origins. Electronically Signed   By: Davina Poke D.O.   On: 12/19/2020 11:01             Medical Problem List and Plan: 1.  Functional deficits secondary to left coronoid process fracture and left hip labral and hamstring tendon tears with associated large hematoma which required I&D, vac             -patient may not yet shower             -ELOS/Goals: 14 days, mod I to supervision with PT and OT 2.   Antithrombotics: -DVT/anticoagulation:  Pharmaceutical: Heparin-->question transition to Eliquis              -antiplatelet therapy: N/A 3. Pain Management: Hydrocodone prn 4. Mood: LCSW to follow for evaluation and support.              -antipsychotic agents:  5. Neuropsych: This patient is capable of making decisions on her own behalf. 6. Skin/Wound Care: Will monitor wound for healing. Add protein supplement to promote wound healing.              -VAC removed from wound yesterday and wound closed by ortho                         -continue local dressing                         -ACE wrap removed as it creating a tourniquet effect on thigh, skin almost broken down in fact 7. Fluids/Electrolytes/Nutrition: Monitor I/O. Check lytes in am 8. CAF: Monitor HR tid--continue amiodarone 9. Hypothyroid: On supplement 10. Orthostatic hypotension: Will continue to monitor orthostatic vitals              --Florinef increased to 0.2 mg bid on 04/01             -blood transfusion today, may need more than one unit             -difficult pt to apply TEDS/abd binder to given wounds and body habitus.             -encourage PO intake 11. H/o CVA with residual mild aphasia: On Crestor and Eliquis 12. OIC: Has not had BM since admission             --augment bowel regimen.   13. Acute blood loss anemia:  H/H trending down from 10.7-->7.2.             --Recheck CBC in am.              --  Transfused with one unit on 04/01, may need an additional unit especially if she's a little dry 14. Pre-renal azotemia: BUN up to 25-->push po fluid intake 15. Leucocytosis: WBC has varied since surgery. Will monitor for fevers and other signs of infection.              --recheck white count in am.     I have personally performed a face to face diagnostic evaluation of this patient and formulated the key components of the plan.  Additionally, I have personally reviewed laboratory data, imaging studies, as well as relevant  notes and concur with the physician assistant's documentation above.  The patient's status has not changed from the original H&P.  Any changes in documentation from the acute care chart have been noted above.  Meredith Staggers, MD, [place], PA-C 12/20/2020

## 2020-12-22 NOTE — Progress Notes (Addendum)
     Kayla Harrison is a 75 y.o. female   Orthopaedic diagnosis:status post incision and drainage of left thigh hematoma and wound VAC placement  Subjective: She is doing well.  Pain continues to be improved since surgery.  She notes the wound VAC occasionally will make some noise but then stops.  Output has decreased.  Objectyive: Vitals:   12/21/20 2255 12/22/20 0549  BP: (!) 96/57 111/67  Pulse: 72 66  Resp: 16 16  Temp: 98.4 F (36.9 C) 98.6 F (37 C)  SpO2: 96% 97%     Exam: Awake and alert Respirations even and unlabored No acute distress  Left thigh with wound VAC in place.  There has been some reaccumulation of the fluid but overall is improved.  No worsening ecchymosis.  No fluctuance.  Assessment: Left thigh hematoma status post incision and drainage and wound VAC placement   Plan: I do not feel like the wound VAC is doing much in the way of reducing reaccumulation of the fluid.  Will discontinue the wound VAC today and close the wound.  We will place a compressive dressing on the wound and continue to monitor the skin.  Procedure: The wound VAC was shut off and the wound VAC sponge was removed without event.  Then the area around the incision was cleaned was saline and sterilized with iodine. Then 1% lidocaine was used to infiltrate around the wound for anesthetic purposes.  About 10 mL were used. Then using a 3-0 nylon suture and the wound was closed in a simple interrupted fashion.  The wound closed well without complication.  She tolerated the procedure well.  Soft dressing was placed.  Compressive Ace wrap was placed.  She is suitable to discharge from an orthopedic standpoint once appropriate with regard to the medical team.  Patient remains on heparin drip.  Okay from orthopedic standpoint to resume DOAC.   Radene Journey, MD

## 2020-12-22 NOTE — PMR Pre-admission (Signed)
PMR Admission Coordinator Pre-Admission Assessment  Patient: Kayla Harrison is an 75 y.o., female MRN: [id number] DOB: 08-25-1946 Height: _0  (167.6 cm) Weight: 92.5 kg  Insurance Information HMO:     PPO:      PCP:      IPA:      80/20:      OTHER:  PRIMARY: Medicare      Policy#: [id number]      Subscriber: pt Benefits:  Phone #: passport one online     Name: 4/1 Eff. Date: 05/24/2009     Deduct: $1556      Out of Pocket Max: none      Life Max: none CIR: 100%      SNF: 20 full days Outpatient: 80%     Co-Pay: 20% Home Health: 100%      Co-Pay: none DME: 80%     Co-Pay: 20% Providers: pt choice  SECONDARY: [employer]      Policy#: [id number]  Financial Counselor:       Phone#:   The "Data Collection Information Summary" for patients in Inpatient Rehabilitation Facilities with attached "Privacy Act [place] Records" was provided and verbally reviewed with: Patient  Emergency Contact Information Contact Information    Name Relation Home Work Mobile   [place] Sister   [phone]   Smith,[name] [place], [place]   [phone]   Geanie Berlin   [phone]      Current Medical History  Patient Admitting Diagnosis: Debility  History of Present Illness:  75 year old female with history of Lung cancer, NHL w/ SVC syndrome, lap band surgery, A fib--on Eliquis, chronic LBP, CVA with expressive deficits (CIR  who was admitted on 12/15/20 after a fall with reports of HA, acute on chronic neck and back pain as well as increase in weakness. She was found to have non-displaced left coronoid process fracture with question of lucency over lateral aspect of radial head and large SQ hematoma left hip with moderate OA. Ortho evaluated patient and recommended WBAT left elbow and questioned need of I & D of left hip hematoma. Right rib pain treated with lidocaine patch.  Eliquis held and she was placed on heparin bridge.   She  continued to have severe hip pain with significant edema and concerns of skin necrosis. MRI left hip showed large hematoma left hip 10 cm in size, partial thickness tear of anterosuperior labrum and low grade partial tears of bilateral hamstring tendon origin. She was taken to OR on 03/29 for I & D of latera hip hematoma by Dr. Lucia Gaskins. To receive 1 unit PRBCs today for Hgb 7.2.   Dr. Caryl Comes consulted for input on h/o recurrent falls and profound orthostatic hypotension with near syncope with activity. Florinef added and titrated upwards. Post op wound VAC removed 03/31 and wound closed. She is WBAT and showing improvement in activity tolerance but continues to be limited by weakness with mild dizziness and HA.   Patient's medical record from [facility] has been reviewed by the rehabilitation admission coordinator and physician.  Past Medical History  Past Medical History:  Diagnosis Date  . Anemia   . Arthritis    osteoarthritis - knees and right shoulder  . Blood transfusion without reported diagnosis   . Breast cancer ([place])    Dr Margot Chimes, total thyroidectomy- 1999- for cancer  . 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 Chronic bilateral pleural effusions   . Colon polyp  Dr Earlean Shawl  . Complication of anesthesia    Ketamine produces LSD reaction, bright colored nightmarish experience   . Dyslipidemia   . Endometriosis   . Fibroids   . H/O pleural effusion    s/p thoracentesis w 3266ml withdrawn  . Hepatitis    [place]hepatitis   . History of dysphagia    due to radiation therapy  . History of hiatal hernia    small noted on PET scan  . Hypertension   . Hypothyroidism   . Lung cancer, lower lobe ([place]) 01/2017   radiation RX completed 03/04/17; will start chemo 6/27, pt unaware of lung cancer  . Morbid obesity ([place])    Status post lap band surgery  . Nephrolithiasis   . Non Hodgkin's lymphoma ([place])    on chemotherapy  . Persistent atrial fibrillation  ([place])    a. s/p PVI 2008 b. s/p convergent ablation 9562 complicated by bradycardia requiring pacemaker implant  . Personal history of radiation therapy   . Presence of permanent cardiac pacemaker   . Rotator cuff tear    Right  . Stroke [facility])    2003- [place] x2  . SVC syndrome    with lung mass and non hodgkins lymphoma  . Thyroid cancer ([place]) 2000    Family History   family history includes Colon cancer in her father; Diabetes in her brother, paternal aunt, paternal grandmother, and sister; Heart attack in her father; Heart disease (age of onset: 19) in her father; Other in her mother.  Prior Rehab/Hospitalizations Has the patient had prior rehab or hospitalizations prior to admission? Yes  Has the patient had major surgery during 100 days prior to admission? Yes   Current Medications  Current Facility-Administered Medications:  .  0.9 %  sodium chloride infusion (Manually program via Guardrails IV Fluids), , Intravenous, Once, Pokhrel, Laxman, MD .  0.9 %  sodium chloride infusion, , Intravenous, PRN, Pokhrel, Laxman, MD, Last Rate: 10 mL/hr at 12/20/20 1720, Infusion Verify at 12/20/20 1720 .  acetaminophen (TYLENOL) tablet 650 mg, 650 mg, Oral, Q6H PRN, 650 mg at 12/18/20 1810 **OR** acetaminophen (TYLENOL) suppository 650 mg, 650 mg, Rectal, Q6H PRN, Erle Crocker, MD .  amiodarone (PACERONE) tablet 200 mg, 200 mg, Oral, Daily, Erle Crocker, MD, 200 mg at 12/21/20 1045 .  bisacodyl (DULCOLAX) EC tablet 5 mg, 5 mg, Oral, Daily PRN, Erle Crocker, MD, 5 mg at 12/20/20 2229 .  docusate sodium (COLACE) capsule 100 mg, 100 mg, Oral, BID, Erle Crocker, MD, 100 mg at 12/21/20 1045 .  fentaNYL (SUBLIMAZE) injection 25-50 mcg, 25-50 mcg, Intravenous, Q2H PRN, Opyd, Ilene Qua, MD, 50 mcg at 12/21/20 2203 .  fludrocortisone (FLORINEF) tablet 0.2 mg, 0.2 mg, Oral, BID, Deboraha Sprang, MD .  heparin ADULT infusion 100 units/mL (25000 units/258mL), 1,150  Units/hr, Intravenous, Continuous, Rolla Flatten, [facility], Last Rate: 11.5 mL/hr at 12/22/20 0733, 1,150 Units/hr at 12/22/20 0733 .  HYDROcodone-acetaminophen (NORCO/VICODIN) 5-325 MG per tablet 1 tablet, 1 tablet, Oral, Q4H PRN, Erle Crocker, MD, 1 tablet at 12/22/20 [phone] .  levothyroxine (SYNTHROID) tablet 137 mcg, 137 mcg, Oral, Once per day on Sun Mon Tue Wed Thu Fri, Adair, Christopher R, MD, 137 mcg at 12/22/20 [phone] .  [START ON 12/23/2020] levothyroxine (SYNTHROID) tablet 68.5 mcg, 68.5 mcg, Oral, Once per day on Sat, Melony Overly R, MD .  lidocaine (LIDODERM) 5 % 2 patch, 2 patch, Transdermal, Q24H,  Erle Crocker, MD, 2 patch at 12/20/20 2222 .  magnesium citrate solution 1 Bottle, 1 Bottle, Oral, Once, Pokhrel, Laxman, MD .  multivitamin with minerals tablet 1 tablet, 1 tablet, Oral, Daily, Erle Crocker, MD, 1 tablet at 12/22/20 [phone] .  mupirocin ointment (BACTROBAN) 2 % 1 application, 1 application, Nasal, BID, Erle Crocker, MD, 1 application at 64/33/29 2127 .  ondansetron (ZOFRAN) tablet 4 mg, 4 mg, Oral, Q6H PRN **OR** ondansetron (ZOFRAN) injection 4 mg, 4 mg, Intravenous, Q6H PRN, Erle Crocker, MD .  polyethylene glycol (MIRALAX / GLYCOLAX) packet 17 g, 17 g, Oral, Daily PRN, Erle Crocker, MD .  rosuvastatin (CRESTOR) tablet 10 mg, 10 mg, Oral, Once per day on Mon Thu, Adair, Christopher R, MD, 10 mg at 12/21/20 1045 .  senna (SENOKOT) tablet 8.6 mg, 1 tablet, Oral, PRN, Erle Crocker, MD, 8.6 mg at 12/19/20 2147  Patients Current Diet:  Diet Order            Diet regular Room service appropriate? Yes; Fluid consistency: Thin  Diet effective now                 Precautions / Restrictions Precautions Precautions: Fall Restrictions Weight Bearing Restrictions: Yes LUE Weight Bearing: Weight bearing as tolerated Other Position/Activity Restrictions: assume NWB given splint.  Sling for comfort and mobility.   Has  the patient had 2 or more falls or a fall with injury in the past year? Yes Frequent falls due to ortho stasis  Prior Activity Level Community (5-7x/wk): Mod I with RW; drives; goes to gym  Prior Functional Level Self Care: Did the patient need help bathing, dressing, using the toilet or eating? Independent  Indoor Mobility: Did the patient need assistance with walking from room to room (with or without device)? Independent  Stairs: Did the patient need assistance with internal or external stairs (with or without device)? Independent  Functional Cognition: Did the patient need help planning regular tasks such as shopping or remembering to take medications? [place] / [place] Devices/Equipment: Cane (specify quad or straight),Eyeglasses,Walker (specify type) Home Equipment: Walker - 4 wheels,Shower seat - built in  Prior Device Use: Indicate devices/aids used by the patient prior to current illness, exacerbation or injury? Walker  Current Functional Level Cognition  Arousal/Alertness: Awake/alert Overall Cognitive Status: Within Functional Limits for tasks assessed Orientation Level: Oriented X4 Attention: Sustained Sustained Attention: Appears intact Memory: Impaired Memory Impairment: Retrieval deficit Awareness: Appears intact Problem Solving: Appears intact Safety/Judgment: Appears intact    Extremity Assessment (includes Sensation/Coordination)  Upper Extremity Assessment: LUE deficits/detail,RUE deficits/detail RUE Deficits / Details: Generalized weakness. Pt reports 2 year h/o intermittent fine motor coordination impacting writing and grasping small items. LUE Deficits / Details: able to use functionally with rw during SPT LUE: Unable to fully assess due to immobilization  Lower Extremity Assessment: Defer to PT evaluation    ADLs  Overall ADL's : Needs assistance/impaired Eating/Feeding: Set up,Sitting Grooming: Wash/dry  hands,Wash/dry face,Oral care,Set up,Sitting Upper Body Dressing : Moderate assistance,Sitting Lower Body Dressing: Moderate assistance,Sit to/from stand Toilet Transfer: Minimal Furniture conservator/restorer Details (indicate cue type and reason): recliner to EOB on her right Functional mobility during ADLs: Minimal assistance General ADL Comments: Pt completed SPT then immediately back to bed d/t near syncope during transfers    Mobility  Overal bed mobility: Needs Assistance Bed Mobility: Supine to Sit Supine to sit: Min assist Sit to supine: Mod assist General  bed mobility comments: Min assistance for trunk elevation and use of bed pad to advance hips to edge of bed.  Denies dizziness sitting edge of bed.    Transfers  Overall transfer level: Needs assistance Equipment used: Rolling walker (2 wheeled) Transfers: Sit to/from Stand Sit to Stand: Min assist,From elevated surface Stand pivot transfers: Min assist General transfer comment: Cues for hand placement performed sit to stand x 2 reps.    Ambulation / Gait / Stairs / Wheelchair Mobility  Ambulation/Gait Ambulation/Gait assistance: Min assist,+2 safety/equipment (for close chair follow.) Gait Distance (Feet): 15 Feet (+ 20 ft.) Assistive device: Rolling walker (2 wheeled) Gait Pattern/deviations: Step-to pattern,Trunk flexed,Decreased stride length General Gait Details: Pt able to progress gt distance with mild dizziness.  Close chair follow used this session but no syncopal events. Gait velocity: decreased    Posture / Balance Balance Overall balance assessment: Needs assistance Sitting-balance support: No upper extremity supported,Feet supported Sitting balance-Leahy Scale: Good Standing balance support: Bilateral upper extremity supported,During functional activity Standing balance-Leahy Scale: Poor Standing balance comment: patient reliant on at least single UE support High Level Balance Comments: patient  losing balance bacwards with sit to stand.    Special needs/care consideration Ortho stasis at baseline chronic   Previous Home Environment  Living Arrangements: Alone  Lives With: Alone Available Help at Discharge: Friend(s),Available PRN/intermittently Type of Home: House Home Layout: One level Home Access: Level entry Bathroom Shower/Tub: Multimedia programmer: Handicapped height Bathroom Accessibility: Yes How Accessible: Accessible via walker [place]: No  Discharge Living Setting Plans for Discharge Living Setting: Patient's home,Alone Type of Home at Discharge: House Discharge Home Layout: One level Discharge Home Access: Level entry Discharge Bathroom Shower/Tub: Walk-in shower Discharge Bathroom Toilet: Handicapped height Discharge Bathroom Accessibility: Yes How Accessible: Accessible via walker Does the patient have any problems obtaining your medications?: No  Social/Family/Support Systems Contact Information: sisters Anticipated Caregiver: sisters. Local sister and Raymon Mutton, from [place] to come short term Anticipated Caregiver's Contact Information: see above Caregiver Availability: Intermittent Discharge Plan Discussed with Primary Caregiver: Yes Is Caregiver In Agreement with Plan?: Yes Does Caregiver/Family have Issues with Lodging/Transportation while Pt is in Rehab?: No  Goals Patient/Family Goal for Rehab: Mod I to supervision wiht PT and OT Expected length of stay: ELOS 2 weeks Pt/Family Agrees to Admission and willing to participate: Yes Program Orientation Provided & Reviewed with Pt/Caregiver Including Roles  & Responsibilities: Yes  Decrease burden of Care through IP rehab admission: n/a  Possible need for SNF placement upon discharge: not anticipated  Patient Condition: I have reviewed medical records from [facility]  spoken with  patient and family member. I met with patient at the bedside for inpatient  rehabilitation assessment.  Patient will benefit from ongoing PT and OT, can actively participate in 3 hours of therapy a day 5 days of the week, and can make measurable gains during the admission.  Patient will also benefit from the coordinated team approach during an Inpatient Acute Rehabilitation admission.  The patient will receive intensive therapy as well as Rehabilitation physician, nursing, social worker, and care management interventions.  Due to bladder management, bowel management, safety, skin/wound care, disease management, medication administration, pain management and patient education the patient requires 24 hour a day rehabilitation nursing.  The patient is currently mod assist overall with mobility and basic ADLs.  Discharge setting and therapy post discharge at home with home health is anticipated.  Patient has agreed to participate in  the Acute Inpatient Rehabilitation Program and will admit today.  Preadmission Screen Completed By:  Cleatrice Burke, 12/22/2020 12:35 PM ______________________________________________________________________   Discussed status with Dr. Naaman Plummer  on  12/22/2020 at  1242 and received approval for admission today.  Admission Coordinator:  Cleatrice Burke, RN, time  6122 Date  12/22/2020   Assessment/Plan: Diagnosis: left elbow fx, left hip hematoma after fall with associated pain and gait deficits 1. Does the need for close, 24 hr/day Medical supervision in concert with the patient's rehab needs make it unreasonable for this patient to be served in a less intensive setting? Yes 2. Co-Morbidities requiring supervision/potential complications: lung ca, NHL, ABLA, afib 3. Due to bladder management, bowel management, safety, skin/wound care, disease management, medication administration, pain management and patient education, does the patient require 24 hr/day rehab nursing? Yes 4. Does the patient require coordinated care of a physician, rehab nurse,  PT, OT, and SLP to address physical and functional deficits in the context of the above medical diagnosis(es)? Yes Addressing deficits in the following areas: balance, endurance, locomotion, strength, transferring, bowel/bladder control, bathing, dressing, feeding, grooming, toileting and psychosocial support 5. Can the patient actively participate in an intensive therapy program of at least 3 hrs of therapy 5 days a week? Yes 6. The potential for patient to make measurable gains while on inpatient rehab is excellent 7. Anticipated functional outcomes upon discharge from inpatient rehab: modified independent to supervision PT, modified independent and supervision OT, n/a SLP 8. Estimated rehab length of stay to reach the above functional goals is: 14 days 9. Anticipated discharge destination: Home 10. Overall Rehab/Functional Prognosis: excellent   MD Signature: Meredith Staggers, MD, [facility] 12/22/2020

## 2020-12-22 NOTE — Consult Note (Signed)
Per my phone conversation with the PA working with Dr. Gwenlyn Saran, the surgical team removed the [facility] dressing yesterday and closed the wound. No need for WOC care at this time. Val Riles, RN, MSN, CWOCN, CNS-BC, pager [phone]

## 2020-12-22 NOTE — Progress Notes (Signed)
Inpatient Rehabilitation Medication Review by a Pharmacist  A complete drug regimen review was completed for this patient to identify any potential clinically significant medication issues.  Clinically significant medication issues were identified:  Yes   Type of Medication Issue Identified Description of Issue Plan Plan Accepted by Provider? (Yes / No)  Drug Interaction(s) (clinically significant)      Duplicate Therapy      Allergy      No Medication Administration End Date      Incorrect Dose      Additional Drug Therapy Needed      Other  Patient transitioned from heparin to apixaban this afternoon. Of note patient with large hematoma s/p evacuation. No documentation that RN from 5N30 gave apixaban as prescribed at 1600. Apxiaban to start 4/2 AM per rehab orders.  Called 5N30 RN Mable Fill) and confirmed she did NOT give apixaban prior to patient transfer but did stop heparin. Will change apixaban first dose to now and called 4W RN to ask her to give it.        Pharmacist comments: No provider notified since pharmacy consulted to transition from heparin to apixaban.   Time spent performing this drug regimen review (minutes): 15 minutes   Cristela Felt, PharmD Clinical Pharmacist  12/22/2020 5:19 PM

## 2020-12-23 LAB — CBC WITH DIFFERENTIAL/PLATELET
Abs Immature Granulocytes: 0.22 10*3/uL — ABNORMAL HIGH (ref 0.00–0.07)
Basophils Absolute: 0 10*3/uL (ref 0.0–0.1)
Basophils Relative: 0 %
Eosinophils Absolute: 0.1 10*3/uL (ref 0.0–0.5)
Eosinophils Relative: 1 %
HCT: 22.1 % — ABNORMAL LOW (ref 36.0–46.0)
Hemoglobin: 7.1 g/dL — ABNORMAL LOW (ref 12.0–15.0)
Immature Granulocytes: 2 %
Lymphocytes Relative: 8 %
Lymphs Abs: 1 10*3/uL (ref 0.7–4.0)
MCH: 29.6 pg (ref 26.0–34.0)
MCHC: 32.1 g/dL (ref 30.0–36.0)
MCV: 92.1 fL (ref 80.0–100.0)
Monocytes Absolute: 1.5 10*3/uL — ABNORMAL HIGH (ref 0.1–1.0)
Monocytes Relative: 12 %
Neutro Abs: 9.8 10*3/uL — ABNORMAL HIGH (ref 1.7–7.7)
Neutrophils Relative %: 77 %
Platelets: 205 10*3/uL (ref 150–400)
RBC: 2.4 MIL/uL — ABNORMAL LOW (ref 3.87–5.11)
RDW: 17.1 % — ABNORMAL HIGH (ref 11.5–15.5)
WBC: 12.6 10*3/uL — ABNORMAL HIGH (ref 4.0–10.5)
nRBC: 0.3 % — ABNORMAL HIGH (ref 0.0–0.2)

## 2020-12-23 LAB — COMPREHENSIVE METABOLIC PANEL
ALT: 16 U/L (ref 0–44)
AST: 34 U/L (ref 15–41)
Albumin: 2 g/dL — ABNORMAL LOW (ref 3.5–5.0)
Alkaline Phosphatase: 56 U/L (ref 38–126)
Anion gap: 8 (ref 5–15)
BUN: 26 mg/dL — ABNORMAL HIGH (ref 8–23)
CO2: 23 mmol/L (ref 22–32)
Calcium: 8.1 mg/dL — ABNORMAL LOW (ref 8.9–10.3)
Chloride: 105 mmol/L (ref 98–111)
Creatinine, Ser: 0.92 mg/dL (ref 0.44–1.00)
GFR, Estimated: >60 mL/min (ref >60)
Glucose, Bld: 124 mg/dL — ABNORMAL HIGH (ref 70–99)
Potassium: 4.6 mmol/L (ref 3.5–5.1)
Sodium: 136 mmol/L (ref 135–145)
Total Bilirubin: 1.1 mg/dL (ref 0.3–1.2)
Total Protein: 4.5 g/dL — ABNORMAL LOW (ref 6.5–8.1)

## 2020-12-23 LAB — PREPARE RBC (CROSSMATCH)

## 2020-12-23 MED ORDER — MAGNESIUM CITRATE PO SOLN
1.0000 | Freq: Once | ORAL | Status: AC
  Administered 2020-12-23: 1 via ORAL

## 2020-12-23 MED ORDER — SENNA 8.6 MG PO TABS
2.0000 | ORAL_TABLET | Freq: Two times a day (BID) | ORAL | Status: DC
  Administered 2020-12-23 – 2021-01-01 (×19): 17.2 mg via ORAL
  Filled 2020-12-23 (×21): qty 2

## 2020-12-23 MED ORDER — MAGNESIUM CITRATE PO SOLN
2.0000 | Freq: Once | ORAL | Status: DC
  Filled 2020-12-23: qty 296

## 2020-12-23 MED ORDER — MAGNESIUM CITRATE PO SOLN
1.0000 | Freq: Once | ORAL | Status: DC
  Filled 2020-12-23: qty 296

## 2020-12-23 MED ORDER — SODIUM CHLORIDE 0.9% IV SOLUTION: Freq: Once | INTRAVENOUS | Status: AC

## 2020-12-23 NOTE — Progress Notes (Signed)
Patient transferred from 4W-04 to 5C-06. Patient is in no acute distress. Oriented to this unit call light placed in reach.

## 2020-12-23 NOTE — Progress Notes (Signed)
Pt hasn't had BM in 8 days. Gave pt dulcolax suppository @0530 , dig stim and put on bed pan. Pt called stating she is in great pain and unable to void. Requests an enema, says she is afraid she is going to rupture, requests enema; an order is there for one, so will advise oncoming staff to administer enema.

## 2020-12-23 NOTE — Progress Notes (Signed)
Patient Name: Kayla Harrison      SUBJECTIVE: Admitted with a fall fracture of her elbow, ribs.  Also bruised hip Orthostatic vital signs showed profound decrease in blood pressure 110--80 going from lying to sitting.  Unable to stand.   Feels washed out Transfused today  Called me early this am 2/2 constipation and not being able to get help from nursing  BP better     Past Medical History:  Diagnosis Date  . Anemia   . Arthritis    osteoarthritis - knees and right shoulder  . Blood transfusion without reported diagnosis   . Breast cancer ([place])    Dr Margot Chimes, total thyroidectomy- 1999- for cancer  . 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 Chronic bilateral pleural effusions   . Colon polyp    Dr Earlean Shawl  . Complication of anesthesia    Ketamine produces LSD reaction, bright colored nightmarish experience   . Dyslipidemia   . Endometriosis   . Fibroids   . H/O pleural effusion    s/p thoracentesis w 3270ml withdrawn  . Hepatitis    [place] ?hepatitis   . History of dysphagia    due to radiation therapy  . History of hiatal hernia    small noted on PET scan  . Hypertension   . Hypothyroidism   . Lung cancer, lower lobe ([place]) 01/2017   radiation RX completed 03/04/17; will start chemo 6/27, pt unaware of lung cancer  . Morbid obesity ([place])    Status post lap band surgery  . Nephrolithiasis   . Non Hodgkin's lymphoma ([place])    on chemotherapy  . Persistent atrial fibrillation ([place])    a. s/p PVI 2008 b. s/p convergent ablation 0092 complicated by bradycardia requiring pacemaker implant  . Personal history of radiation therapy   . Presence of permanent cardiac pacemaker   . Rotator cuff tear    Right  . Stroke [facility])    2003- [place] x2  . SVC syndrome    with lung mass and non hodgkins lymphoma  . Thyroid cancer ([place]) 2000    Scheduled Meds:  Scheduled Meds: . amiodarone  200 mg Oral Daily  . apixaban  5 mg Oral BID  .  fludrocortisone  0.2 mg Oral BID WC  . [START ON 12/24/2020] levothyroxine  137 mcg Oral Once per day on Sun Mon Tue Wed Thu Fri  . levothyroxine  68.5 mcg Oral Once per day on Sat  . lidocaine  2 patch Transdermal Q24H  . multivitamin with minerals  1 tablet Oral Daily  . [START ON 12/25/2020] rosuvastatin  10 mg Oral Once per day on Mon Thu  . senna  2 tablet Oral Daily   Continuous Infusions:  acetaminophen, alum & mag hydroxide-simeth, bisacodyl, diphenhydrAMINE, guaiFENesin-dextromethorphan, oxyCODONE, polyethylene glycol, prochlorperazine **OR** prochlorperazine **OR** prochlorperazine, sodium phosphate, traZODone    PHYSICAL EXAM Vitals:   12/23/20 1049 12/23/20 1104 12/23/20 1109 12/23/20 1256  BP: (!) 114/53 (!) 105/49 (!) 105/49 (!) 108/55  Pulse: 70 69 69 70  Resp: 17 18 18 17   Temp: 99.1 F (37.3 C) 98.2 F (36.8 C) 98.2 F (36.8 C) 98.5 F (36.9 C)  TempSrc: Oral  Oral   SpO2: 98%   98%   Well developed and nourished in no acute distress HENT normal Neck supple   Regular rate and rhythm,   No Clubbing cyanosis edema Skin-warm and dry A & Oriented  Grossly normal  sensory and motor function       Intake/Output Summary (Last 24 hours) at 12/23/2020 1402 Last data filed at 12/23/2020 1049 Gross per 24 hour  Intake 575 ml  Output --  Net 575 ml    LABS: Basic Metabolic Panel: Recent Labs  Lab 12/17/20 0122 12/18/20 0756 12/20/20 0650 12/21/20 0423 12/22/20 0302 12/23/20 0510  NA 136 136 137 138 137 136  K 4.5 4.4 4.8 4.5 4.6 4.6  CL 103 103 104 105 104 105  CO2 28 28 28 28 27 23   GLUCOSE 118* 109* 142* 114* 117* 124*  BUN 25* 20 20 25* 25* 26*  CREATININE 1.03* 0.91 0.84 1.06* 0.93 0.92  CALCIUM 8.3* 8.3* 8.2* 8.0* 8.2* 8.1*  MG  --   --   --  1.9 1.9  --   PHOS  --   --   --  3.0  --   --    Cardiac Enzymes: No results for input(s): CKTOTAL, CKMB, CKMBINDEX, TROPONINI in the last 72 hours. CBC: Recent Labs  Lab 12/17/20 0122 12/18/20 0756  12/19/20 0314 12/20/20 0650 12/21/20 0423 12/22/20 0302 12/23/20 0510  WBC 8.0 8.8 8.8 14.1* 10.3 12.0* 12.6*  NEUTROABS  --   --   --   --   --   --  9.8*  HGB 10.7* 9.7* 9.5* 8.3* 7.4* 7.2* 7.1*  HCT 32.6* 29.7* 30.1* 25.7* 23.6* 23.1* 22.1*  MCV 91.3 92.2 94.7 93.8 98.7 95.9 92.1  PLT 172 151 146* 179 184 202 205   Thyroid Function Tests: No results for input(s): TSH, T4TOTAL, T3FREE, THYROIDAB in the last 72 hours.  Invalid input(s): FREET3 Anemia Panel: No results for input(s): VITAMINB12, FOLATE, FERRITIN, TIBC, IRON, RETICCTPCT in the last 72 hours.   Device Interrogation:* pending    ASSESSMENT AND PLAN:  Atrial fibrillation-persistent  Amiodarone therapy-longstanding  Sinus node dysfunction status post pacemaker  Orthostatic hypotension-profound  Falls-recurrent  Anemia  Still hypotensive  Will increase florinef>>0.2  Mg bid    Reviewed w nursing the importance of reverse trendelenburg and OOB> chair      Signed, [name] MD  12/23/2020

## 2020-12-23 NOTE — Plan of Care (Signed)
  Problem: Consults Goal: Skin Care Protocol Initiated - if Braden Score 18 or less Description: If consults are not indicated, leave blank or document N/A Outcome: Progressing   Problem: RH BOWEL ELIMINATION Goal: RH STG MANAGE BOWEL WITH ASSISTANCE Description: STG Manage Bowel with mod I Assistance. Outcome: Progressing   Problem: RH BLADDER ELIMINATION Goal: RH STG MANAGE BLADDER WITH ASSISTANCE Description: STG Manage Bladder With mod I Assistance Outcome: Progressing   Problem: RH SKIN INTEGRITY Goal: RH STG MAINTAIN SKIN INTEGRITY WITH ASSISTANCE Description: STG Maintain Skin Integrity With MOD I Assistance. Outcome: Progressing   Problem: RH SAFETY Goal: RH STG ADHERE TO SAFETY PRECAUTIONS W/ASSISTANCE/DEVICE Description: STG Adhere to Safety Precautions With cues and reminders Outcome: Progressing   Problem: RH PAIN MANAGEMENT Goal: RH STG PAIN MANAGED AT OR BELOW PT'S PAIN GOAL Description: Pain level less than 4 on scale of 0-10 Outcome: Progressing   Problem: Consults Goal: RH GENERAL PATIENT EDUCATION Description: See Patient Education module for education specifics. Outcome: Progressing

## 2020-12-23 NOTE — Progress Notes (Signed)
PROGRESS NOTE   Subjective/Complaints:  Pt reported to nursing at 5am that she hadn't had a BM in 8 days and needed t go- there was no description to nursing prior to this time she needed to have a BM prior/last night.  She was upset with me this AM because "you didn't respond when we called you at 5am for Mg citrate"- I explained I was called at 7am and she had the medications that were ordered until then-   I ordered Mg citrate and when I saw her at 9am, she was having a BM- "but not enough".   She also reports she feels very weak and having severe orthostatic hypotension, and has trouble using bedpan- but explained with hypotension, can get her up easily to BSC/toilet.  She reports her pain as 8/10- and explains she needed manual evacuation, but then asked me to "explain manual evacuation". In spite of asking for it.   Then she admitted she was tired and "felt a little loopy'.  I ordered more blood since her Hb was still 7.1 and very orthostatic- and pale.     ROS:  Pt denies SOB, abd pain, CP, N/V, and vision changes  Objective:   No results found. Recent Labs    12/22/20 0302 12/23/20 0510  WBC 12.0* 12.6*  HGB 7.2* 7.1*  HCT 23.1* 22.1*  PLT 202 205   Recent Labs    12/22/20 0302 12/23/20 0510  NA 137 136  K 4.6 4.6  CL 104 105  CO2 27 23  GLUCOSE 117* 124*  BUN 25* 26*  CREATININE 0.93 0.92  CALCIUM 8.2* 8.1*    Intake/Output Summary (Last 24 hours) at 12/23/2020 2024 Last data filed at 12/23/2020 1715 Gross per 24 hour  Intake 973.83 ml  Output --  Net 973.83 ml        Physical Exam: Vital Signs Blood pressure (!) 105/59, pulse 68, temperature 98.2 F (36.8 C), resp. rate 17, SpO2 98 %.    General: awake, alert, appropriate, very pale appearing, sitting up on bedpan; irritable,  NAD HENT: conjugate gaze but very pale conjunctivae B/L; oropharynx moist CV: regular rate but orthostatic with  sitting up; no JVD Pulmonary: CTA B/L; no W/R/R- good air movement GI: abd soft, but TTP diffusely- hypoactive BS; ND Psychiatric: irritable;  Neurological: Ox2-3- a little confused and kept repeating her self  Musculoskeletal:  General: Swellingand deformitypresent.  Cervical back: Normal range of motion.  Right lower leg: Edemapresent.  Left lower leg: Edemapresent.  Skin: Findings: Bruisingand erythemapresent. Also has a large purple hematoma on R elbow and L hip very bruised/purple/swollen.    MS:  Cranial nerve exam unremarkable. RUE 5/5 prox to distal. LUE 4-/5 with limitations at elbow d/t pain. LUE 2/5 prox to 4/5 distally d/t pain. RLE 3+ prox to 4+/5 distally. No focal sensory findings.    Assessment/Plan: 1. Functional deficits which require 3+ hours per day of interdisciplinary therapy in a comprehensive inpatient rehab setting.  Physiatrist is providing close team supervision and 24 hour management of active medical problems listed below.  Physiatrist and rehab team continue to assess barriers to discharge/monitor patient progress toward functional and medical  goals  Care Tool:  Bathing              Bathing assist       Upper Body Dressing/Undressing Upper body dressing   What is the patient wearing?: Hospital gown only    Upper body assist Assist Level: Set up assist    Lower Body Dressing/Undressing Lower body dressing      What is the patient wearing?: Incontinence brief     Lower body assist Assist for lower body dressing: Moderate Assistance - Patient 50 - 74%     Toileting Toileting    Toileting assist Assist for toileting: Maximal Assistance - Patient 25 - 49% (incontinent)     Transfers Chair/bed transfer  Transfers assist           Locomotion Ambulation   Ambulation assist              Walk 10 feet activity   Assist           Walk 50 feet activity   Assist            Walk 150 feet activity   Assist           Walk 10 feet on uneven surface  activity   Assist           Wheelchair     Assist               Wheelchair 50 feet with 2 turns activity    Assist            Wheelchair 150 feet activity     Assist          Blood pressure (!) 105/59, pulse 68, temperature 98.2 F (36.8 C), resp. rate 17, SpO2 98 %.  Medical Problem List and Plan: 1.Functional deficitssecondary to left coronoid process fracture and left hip labral and hamstring tendon tears with associated large hematoma which required I&D, vac -patient maynot yetshower- due to orthostatic hypotension -ELOS/Goals: 14 days, mod I to supervision with PT and OT 2. Antithrombotics: -DVT/anticoagulation:Pharmaceutical:Heparin-->question transition to Eliquis -antiplatelet therapy: N/A 3. Pain Management:Hydrocodone prn  4/2- pt reports pain 8/10, and "needs something stronger", but then admitted didn't feel like herself- will con't Norco for now due to concern that will get confused.  4. Mood:LCSW to follow for evaluation and support. -antipsychotic agents:  5. Neuropsych: This patientiscapable of making decisions on herown behalf. 6. Skin/Wound Care:Will monitor wound for healing. Add protein supplement to promote wound healing. -VAC removed from wound yesterday and wound closed by ortho -continue local dressing -ACE wrap removed as it creating a tourniquet effect on thigh, skin almost broken down in fact  4/2- also has severe R elbow hematoma- not when it occurred, will con't to monitor- if needs more IVFs/blood, etc, IV team said would need PICC>  7. Fluids/Electrolytes/Nutrition:Monitor I/O. Check lytes in am 8. CAF: Monitor HR tid--continue amiodarone 9. Hypothyroid: On supplement 10. Orthostatic hypotension: Will  continue to monitor orthostatic vitals  --Florinef increased to 0.2 mg bid on 04/01 -blood transfusion today, may need more than one unit -difficult pt to apply TEDS/abd binder to given wounds and body habitus. -encourage PO intake  4/2- per note today Florinef increased, but was yesterday- will monitor- hopefully will improve with more pRBCs today.  11. H/o CVA with residual mild aphasia: On Crestor and Eliquis 12. OIC: Has not had BM since admission --augment bowel regimen  4/2- will give Mg citrate- had at least 2  BMs today- will monitor- also was manually disimimpacted- will also increase bowel meds 13. Acute blood loss anemia: H/H trending down from 10.7-->7.2. --Recheck CBC in am.  --Transfused with one unit on 04/01, may need an additional unitespecially if she's a little dry  4/2- Hb stable at 7.2- will give 1 more unit of blood.  14. Pre-renal azotemia: BUN up to 25-->push pofluid intake  4/2- same- will push fluids 15. Leucocytosis: WBC has varied since surgery. Will monitor for fevers and other signs of infection.  --recheck white count in am.   4/2- stable- will monitor for infection.      LOS: 1 days A FACE TO FACE EVALUATION WAS PERFORMED  Dellamae Rosamilia 12/23/2020, 8:24 PM

## 2020-12-23 NOTE — Progress Notes (Signed)
Physical Therapy Note  Patient Details  Name: Kayla Harrison MRN: [id number] Date of Birth: 1946/08/30 Today's Date: 12/23/2020  Per RN and MD, to hold therapies this date 2/2 blood transfusion. PT will follow up as appropriate.    Breck Coons, PT, DPT 12/23/2020, 12:46 PM

## 2020-12-23 NOTE — Plan of Care (Signed)
  Problem: Consults Goal: Skin Care Protocol Initiated - if Braden Score 18 or less Description: If consults are not indicated, leave blank or document N/A 12/23/2020 2230 by Dahna Hattabaugh, Renato Gails, RN Outcome: Progressing 12/23/2020 2007 by Hillery Jacks, RN Outcome: Progressing   Problem: RH BOWEL ELIMINATION Goal: RH STG MANAGE BOWEL WITH ASSISTANCE Description: STG Manage Bowel with mod I Assistance. 12/23/2020 2230 by Hillery Jacks, RN Outcome: Progressing 12/23/2020 2007 by Hillery Jacks, RN Outcome: Progressing   Problem: RH BLADDER ELIMINATION Goal: RH STG MANAGE BLADDER WITH ASSISTANCE Description: STG Manage Bladder With mod I Assistance 12/23/2020 2230 by Hillery Jacks, RN Outcome: Progressing 12/23/2020 2007 by Hillery Jacks, RN Outcome: Progressing   Problem: RH SKIN INTEGRITY Goal: RH STG MAINTAIN SKIN INTEGRITY WITH ASSISTANCE Description: STG Maintain Skin Integrity With MOD I Assistance. 12/23/2020 2230 by Hillery Jacks, RN Outcome: Progressing 12/23/2020 2007 by Hillery Jacks, RN Outcome: Progressing   Problem: RH SAFETY Goal: RH STG ADHERE TO SAFETY PRECAUTIONS W/ASSISTANCE/DEVICE Description: STG Adhere to Safety Precautions With cues and reminders 12/23/2020 2230 by Hillery Jacks, RN Outcome: Progressing 12/23/2020 2007 by Hillery Jacks, RN Outcome: Progressing   Problem: RH PAIN MANAGEMENT Goal: RH STG PAIN MANAGED AT OR BELOW PT'S PAIN GOAL Description: Pain level less than 4 on scale of 0-10 12/23/2020 2230 by Hillery Jacks, RN Outcome: Progressing 12/23/2020 2007 by Hillery Jacks, RN Outcome: Progressing   Problem: Consults Goal: RH GENERAL PATIENT EDUCATION Description: See Patient Education module for education specifics. 12/23/2020 2230 by Hillery Jacks, RN Outcome: Progressing 12/23/2020 2007 by Hillery Jacks, RN Outcome: Progressing

## 2020-12-23 NOTE — Progress Notes (Signed)
Called on-call physician and received a verbal order for mag citrate. Given to pt, and awaiting results.

## 2020-12-23 NOTE — Progress Notes (Signed)
Occupational Therapy Session Note  Patient Details  Name: Kayla Harrison MRN: [id number] Date of Birth: 03/06/1946  Today's Date: 12/23/2020 OT Missed Time: 19 Minutes Missed Time Reason: MD hold (comment) (Per MD [place], pt on hold for therapies this date 2/2 blood transfusion.)   Attempted to see pt for scheduled OT eval. Per Rn and MD, to hold therapies this date 2/2 blood transfusion.   Volanda Napoleon MS, OTR/L   12/23/2020, 12:15 PM

## 2020-12-24 DIAGNOSIS — R5381 Other malaise: Principal | ICD-10-CM

## 2020-12-24 DIAGNOSIS — K5903 Drug induced constipation: Secondary | ICD-10-CM

## 2020-12-24 LAB — TYPE AND SCREEN
ABO/RH(D): O POS
Antibody Screen: NEGATIVE
Unit division: 0
Unit division: 0

## 2020-12-24 LAB — CBC WITH DIFFERENTIAL/PLATELET
Abs Immature Granulocytes: 0.14 10*3/uL — ABNORMAL HIGH (ref 0.00–0.07)
Basophils Absolute: 0.1 10*3/uL (ref 0.0–0.1)
Basophils Relative: 1 %
Eosinophils Absolute: 0.1 10*3/uL (ref 0.0–0.5)
Eosinophils Relative: 1 %
HCT: 24.3 % — ABNORMAL LOW (ref 36.0–46.0)
Hemoglobin: 8 g/dL — ABNORMAL LOW (ref 12.0–15.0)
Immature Granulocytes: 1 %
Lymphocytes Relative: 8 %
Lymphs Abs: 0.9 10*3/uL (ref 0.7–4.0)
MCH: 30 pg (ref 26.0–34.0)
MCHC: 32.9 g/dL (ref 30.0–36.0)
MCV: 91 fL (ref 80.0–100.0)
Monocytes Absolute: 1.2 10*3/uL — ABNORMAL HIGH (ref 0.1–1.0)
Monocytes Relative: 11 %
Neutro Abs: 8.5 10*3/uL — ABNORMAL HIGH (ref 1.7–7.7)
Neutrophils Relative %: 78 %
Platelets: 211 10*3/uL (ref 150–400)
RBC: 2.67 MIL/uL — ABNORMAL LOW (ref 3.87–5.11)
RDW: 17 % — ABNORMAL HIGH (ref 11.5–15.5)
WBC: 10.9 10*3/uL — ABNORMAL HIGH (ref 4.0–10.5)
nRBC: 0 % (ref 0.0–0.2)

## 2020-12-24 LAB — BASIC METABOLIC PANEL
Anion gap: 8 (ref 5–15)
BUN: 22 mg/dL (ref 8–23)
CO2: 26 mmol/L (ref 22–32)
Calcium: 8.1 mg/dL — ABNORMAL LOW (ref 8.9–10.3)
Chloride: 104 mmol/L (ref 98–111)
Creatinine, Ser: 0.88 mg/dL (ref 0.44–1.00)
GFR, Estimated: >60 mL/min (ref >60)
Glucose, Bld: 111 mg/dL — ABNORMAL HIGH (ref 70–99)
Potassium: 5.1 mmol/L (ref 3.5–5.1)
Sodium: 138 mmol/L (ref 135–145)

## 2020-12-24 LAB — BPAM RBC
Blood Product Expiration Date: 202204292359
Blood Product Expiration Date: 202204292359
ISSUE DATE / TIME: 202204011402
ISSUE DATE / TIME: 202204021037
Unit Type and Rh: 5100
Unit Type and Rh: 5100

## 2020-12-24 NOTE — Progress Notes (Signed)
Patient Name: Kayla Harrison      SUBJECTIVE: Admitted with a fall fracture of her elbow, ribs.  Also bruised hip Orthostatic vital signs showed profound decrease in blood pressure 110--80 going from lying to sitting.   Stood today and feels much better   Past Medical History:  Diagnosis Date  . Anemia   . Arthritis    osteoarthritis - knees and right shoulder  . Blood transfusion without reported diagnosis   . Breast cancer ([place])    Dr Margot Chimes, total thyroidectomy- 1999- for cancer  . 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 Chronic bilateral pleural effusions   . Colon polyp    Dr Earlean Shawl  . Complication of anesthesia    Ketamine produces LSD reaction, bright colored nightmarish experience   . Dyslipidemia   . Endometriosis   . Fibroids   . H/O pleural effusion    s/p thoracentesis w 3216ml withdrawn  . Hepatitis    [place] ?[place]patitis   . History of dysphagia    due to radiation therapy  . History of hiatal hernia    small noted on PET scan  . Hypertension   . Hypothyroidism   . Lung cancer, lower lobe ([place]) 01/2017   radiation RX completed 03/04/17; will start chemo 6/27, pt unaware of lung cancer  . Morbid obesity ([place])    Status post lap band surgery  . Nephrolithiasis   . Non Hodgkin's lymphoma ([place])    on chemotherapy  . Persistent atrial fibrillation ([place])    a. s/p PVI 2008 b. s/p convergent ablation 4696 complicated by bradycardia requiring pacemaker implant  . Personal history of radiation therapy   . Presence of permanent cardiac pacemaker   . Rotator cuff tear    Right  . Stroke [facility])    2003- [place] x2  . SVC syndrome    with lung mass and non hodgkins lymphoma  . Thyroid cancer ([place]) 2000    Scheduled Meds:  Scheduled Meds: . amiodarone  200 mg Oral Daily  . apixaban  5 mg Oral BID  . fludrocortisone  0.2 mg Oral BID WC  . levothyroxine  137 mcg Oral Once per day on Sun Mon Tue Wed Thu Fri  . levothyroxine   68.5 mcg Oral Once per day on Sat  . lidocaine  2 patch Transdermal Q24H  . multivitamin with minerals  1 tablet Oral Daily  . [START ON 12/25/2020] rosuvastatin  10 mg Oral Once per day on Mon Thu  . senna  2 tablet Oral BID   Continuous Infusions:  acetaminophen, alum & mag hydroxide-simeth, bisacodyl, diphenhydrAMINE, guaiFENesin-dextromethorphan, oxyCODONE, polyethylene glycol, prochlorperazine **OR** prochlorperazine **OR** prochlorperazine, sodium phosphate, traZODone    PHYSICAL EXAM Vitals:   12/23/20 1413 12/23/20 1528 12/23/20 1923 12/24/20 0503  BP: (!) 125/52 (!) 125/54 (!) 105/59 (!) 106/58  Pulse: 70 69 68 68  Resp: 16 18 17 17   Temp: 98.7 F (37.1 C) 98.1 F (36.7 C) 98.2 F (36.8 C) 98.3 F (36.8 C)  TempSrc: Oral Oral    SpO2: 97% 98% 98% 97%   Well developed and nourished in no acute distress HENT normal Neck supple   Clear Regular rate and rhythm, no murmurs or gallops Abd-soft with active BS No Clubbing cyanosis edema Skin-warm and dry  Ecchymoses on the forearms  A & Oriented  Grossly normal sensory and motor function    Intake/Output Summary (Last 24 hours) at 12/24/2020 1336  Last data filed at 12/24/2020 1255 Gross per 24 hour  Intake 934.83 ml  Output --  Net 934.83 ml    LABS: Basic Metabolic Panel: Recent Labs  Lab 12/18/20 0756 12/20/20 0650 12/21/20 0423 12/22/20 0302 12/23/20 0510 12/24/20 0705  NA 136 137 138 137 136 138  K 4.4 4.8 4.5 4.6 4.6 5.1  CL 103 104 105 104 105 104  CO2 28 28 28 27 23 26   GLUCOSE 109* 142* 114* 117* 124* 111*  BUN 20 20 25* 25* 26* 22  CREATININE 0.91 0.84 1.06* 0.93 0.92 0.88  CALCIUM 8.3* 8.2* 8.0* 8.2* 8.1* 8.1*  MG  --   --  1.9 1.9  --   --   PHOS  --   --  3.0  --   --   --    Cardiac Enzymes: No results for input(s): CKTOTAL, CKMB, CKMBINDEX, TROPONINI in the last 72 hours. CBC: Recent Labs  Lab 12/18/20 0756 12/19/20 0314 12/20/20 0650 12/21/20 0423 12/22/20 0302 12/23/20 0510  12/24/20 0705  WBC 8.8 8.8 14.1* 10.3 12.0* 12.6* 10.9*  NEUTROABS  --   --   --   --   --  9.8* 8.5*  HGB 9.7* 9.5* 8.3* 7.4* 7.2* 7.1* 8.0*  HCT 29.7* 30.1* 25.7* 23.6* 23.1* 22.1* 24.3*  MCV 92.2 94.7 93.8 98.7 95.9 92.1 91.0  PLT 151 146* 179 184 202 205 211   Thyroid Function Tests: No results for input(s): TSH, T4TOTAL, T3FREE, THYROIDAB in the last 72 hours.  Invalid input(s): FREET3 Anemia Panel: No results for input(s): VITAMINB12, FOLATE, FERRITIN, TIBC, IRON, RETICCTPCT in the last 72 hours.   Device Interrogation:* pending    ASSESSMENT AND PLAN:  Atrial fibrillation-persistent  Amiodarone therapy-longstanding  Sinus node dysfunction status post pacemaker  Orthostatic hypotension-profound  Falls-recurrent  Anemia    Continue florinef  ? fereheme replacement   OOB chair to avoid bed rest aggravation of orthostatic hypotension  Need Ortho VS  Will work on this tomorrow    Will need a BMET   Requests PICC line because of difficulty drawing blood, .    Signed, [name] MD  12/24/2020

## 2020-12-24 NOTE — Plan of Care (Signed)
  Problem: Consults Goal: Skin Care Protocol Initiated - if Braden Score 18 or less Description: If consults are not indicated, leave blank or document N/A Outcome: Progressing   Problem: RH BOWEL ELIMINATION Goal: RH STG MANAGE BOWEL WITH ASSISTANCE Description: STG Manage Bowel with mod I Assistance. Outcome: Progressing   Problem: RH BLADDER ELIMINATION Goal: RH STG MANAGE BLADDER WITH ASSISTANCE Description: STG Manage Bladder With mod I Assistance Outcome: Progressing   Problem: RH SKIN INTEGRITY Goal: RH STG MAINTAIN SKIN INTEGRITY WITH ASSISTANCE Description: STG Maintain Skin Integrity With MOD I Assistance. Outcome: Progressing   Problem: RH SAFETY Goal: RH STG ADHERE TO SAFETY PRECAUTIONS W/ASSISTANCE/DEVICE Description: STG Adhere to Safety Precautions With cues and reminders Outcome: Progressing

## 2020-12-24 NOTE — Progress Notes (Signed)
PROGRESS NOTE   Subjective/Complaints:  Patient reports she "poop so much-at least 4-5 times"-her abdominal pain is much better.  However her bottom hurts from frequent bowel movements. She says in general she feels a lot better today. She is ready to participate in therapy  ROS:  Pt denies SOB, abd pain, CP, N/V/C/D, and vision changes  Objective:   No results found. Recent Labs    12/23/20 0510 12/24/20 0705  WBC 12.6* 10.9*  HGB 7.1* 8.0*  HCT 22.1* 24.3*  PLT 205 211   Recent Labs    12/23/20 0510 12/24/20 0705  NA 136 138  K 4.6 5.1  CL 105 104  CO2 23 26  GLUCOSE 124* 111*  BUN 26* 22  CREATININE 0.92 0.88  CALCIUM 8.1* 8.1*    Intake/Output Summary (Last 24 hours) at 12/24/2020 1856 Last data filed at 12/24/2020 1255 Gross per 24 hour  Intake 476 ml  Output --  Net 476 ml        Physical Exam: Vital Signs Blood pressure 134/65, pulse 69, temperature 98.3 F (36.8 C), temperature source Oral, resp. rate 17, SpO2 98 %.     General: awake, alert, appropriate, sitting up in bed with a lot more color in her cheeks-yesterday she was is white as her hair; NAD HENT: conjugate gaze; oropharynx moist; conjunctivae still pale but not as bad CV: regular rate; no JVD Pulmonary: CTA B/L; no W/R/R- good air movement GI: soft, ND, (+)BS; much softer, less TTP Psychiatric: appropriate-much more interactive Neurological: Ox3-much more appropriate Musculoskeletal:  General: Swellingand deformitypresent.  Cervical back: Normal range of motion.  Right lower leg: Edemapresent.  Left lower leg: Edemapresent.  Skin: Findings: Bruisingand erythemapresent. Also has a large purple hematoma on R elbow and L hip very bruised/purple/swollen.  The swelling of her hematoma on her right elbow is much better and resorbing her left hip incision the swelling is better she does have a long thin  blister lateral to the incision the erythema is about 50% improved-from the picture below.   MS:  Cranial nerve exam unremarkable. RUE 5/5 prox to distal. LUE 4-/5 with limitations at elbow d/t pain. LUE 2/5 prox to 4/5 distally d/t pain. RLE 3+ prox to 4+/5 distally. No focal sensory findings.    Assessment/Plan: 1. Functional deficits which require 3+ hours per day of interdisciplinary therapy in a comprehensive inpatient rehab setting.  Physiatrist is providing close team supervision and 24 hour management of active medical problems listed below.  Physiatrist and rehab team continue to assess barriers to discharge/monitor patient progress toward functional and medical goals  Care Tool:  Bathing    Body parts bathed by patient: Right arm,Left arm,Chest,Abdomen,Right upper leg,Left upper leg,Face   Body parts bathed by helper: Front perineal area,Buttocks,Right lower leg,Left lower leg     Bathing assist Assist Level: 2 Helpers     Upper Body Dressing/Undressing Upper body dressing   What is the patient wearing?: Dress    Upper body assist Assist Level: Moderate Assistance - Patient 50 - 74%    Lower Body Dressing/Undressing Lower body dressing      What is the patient wearing?: Incontinence brief  Lower body assist Assist for lower body dressing: 2 Helpers     Toileting Toileting    Toileting assist Assist for toileting: 2 Helpers     Transfers Chair/bed transfer  Transfers assist  Chair/bed transfer activity did not occur: Safety/medical concerns (orthostatic on eval)        Locomotion Ambulation   Ambulation assist   Ambulation activity did not occur: Safety/medical concerns (orthostatic on eval)          Walk 10 feet activity   Assist  Walk 10 feet activity did not occur: Safety/medical concerns        Walk 50 feet activity   Assist Walk 50 feet with 2 turns activity did not occur: Safety/medical concerns         Walk  150 feet activity   Assist Walk 150 feet activity did not occur: Safety/medical concerns         Walk 10 feet on uneven surface  activity   Assist Walk 10 feet on uneven surfaces activity did not occur: Safety/medical concerns         Wheelchair     Assist     Wheelchair activity did not occur: Safety/medical concerns (orthostatic on eval)         Wheelchair 50 feet with 2 turns activity    Assist    Wheelchair 50 feet with 2 turns activity did not occur: Safety/medical concerns       Wheelchair 150 feet activity     Assist  Wheelchair 150 feet activity did not occur: Safety/medical concerns       Blood pressure 134/65, pulse 69, temperature 98.3 F (36.8 C), temperature source Oral, resp. rate 17, SpO2 98 %.  Medical Problem List and Plan: 1.Functional deficitssecondary to left coronoid process fracture and left hip labral and hamstring tendon tears with associated large hematoma which required I&D, vac -patient maynot yetshower- due to orthostatic hypotension  con't PT and OT -ELOS/Goals: 14 days, mod I to supervision with PT and OT 2. Antithrombotics: -DVT/anticoagulation:Pharmaceutical:Heparin-->question transition to Eliquis -antiplatelet therapy: N/A 3. Pain Management:Hydrocodone prn  4/2- pt reports pain 8/10, and "needs something stronger", but then admitted didn't feel like herself- will con't Oxy at current dose for now due to concern that will get confused.   4/3-patient said pain is better controlled probably due to her abdominal pain resolving; continue regimen 4. Mood:LCSW to follow for evaluation and support. -antipsychotic agents:  5. Neuropsych: This patientiscapable of making decisions on herown behalf. 6. Skin/Wound Care:Will monitor wound for healing. Add protein supplement to promote wound healing. -VAC removed from wound yesterday and wound  closed by ortho -continue local dressing -ACE wrap removed as it creating a tourniquet effect on thigh, skin almost broken down in fact  4/2- also has severe R elbow hematoma- not when it occurred, will con't to monitor- if needs more IVFs/blood, etc, IV team said would need PICC>   4/3-patient says if she needs to receive anything more through an IV, she insists on getting a PICC line 7. Fluids/Electrolytes/Nutrition:Monitor I/O. Check lytes in am 8. CAF: Monitor HR tid--continue amiodarone 9. Hypothyroid: On supplement 10. Orthostatic hypotension: Will continue to monitor orthostatic vitals  --Florinef increased to 0.2 mg bid on 04/01 -blood transfusion today, may need more than one unit -difficult pt to apply TEDS/abd binder to given wounds and body habitus. -encourage PO intake  4/2- per note today Florinef increased, but was yesterday- will monitor- hopefully will improve with more pRBCs today.  I got 2 of PT today-  4/3-cardiology is asking for a more regular BMP-however patient refuses unless she gets a PICC line-we will have primary physician address during this week 11. H/o CVA with residual mild aphasia: On Crestor and Eliquis 12. OIC: Has not had BM since admission --augment bowel regimen  4/2- will give Mg citrate- had at least 2 BMs today- will monitor- also was manually disimimpacted- will also increase bowel meds  4/3- had many BMs- feeling much better- con't bowel meds 13. Acute blood loss anemia: H/H trending down from 10.7-->7.2. --Recheck CBC in am.  --Transfused with one unit on 04/01, may need an additional unitespecially if she's a little dry  4/2- Hb stable at 7.2- will give 1 more unit of blood.   4/3-hemoglobin is up to 8.0-continue to monitor 14. Pre-renal azotemia: BUN up to 25-->push pofluid intake  4/2- same- will push  fluids  4/3- BUN down to 22- con't to push fluids PO 15. Leucocytosis: WBC has varied since surgery. Will monitor for fevers and other signs of infection.  --recheck white count in am.   4/2- stable- will monitor for infection.   4/3- down to 10k- con't to monitor     LOS: 2 days A FACE TO FACE EVALUATION WAS PERFORMED  Kayla Harrison 12/24/2020, 6:56 PM

## 2020-12-24 NOTE — Evaluation (Addendum)
Physical Therapy Assessment and Plan  Patient Details  Name: Kayla Harrison MRN: [id number] Date of Birth: [birth year]  PT Diagnosis: Abnormal posture, Abnormality of gait, Difficulty walking, Dizziness and giddiness, Edema, Impaired sensation, Muscle spasms, Muscle weakness, Pain in joint and Pain in L elbow and hip Rehab Potential: Good ELOS: 2 weeks   Today's Date: 12/24/2020 PT Individual Time: 5681-2751 PT Individual Time Calculation (min): 61 min    Hospital Problem: Principal Problem:   Tear of left hamstring Active Problems:   Physical debility   Labral tear of left hip joint   Acute blood loss anemia   Past Medical History:  Past Medical History:  Diagnosis Date  . Anemia   . Arthritis    osteoarthritis - knees and right shoulder  . Blood transfusion without reported diagnosis   . Breast cancer ([place])    Dr Margot Chimes, total thyroidectomy- 1999- for cancer  . 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 Chronic bilateral pleural effusions   . Colon polyp    Dr Earlean Shawl  . Complication of anesthesia    Ketamine produces LSD reaction, bright colored nightmarish experience   . Dyslipidemia   . Endometriosis   . Fibroids   . H/O pleural effusion    s/p thoracentesis w 3248ml withdrawn  . Hepatitis    [place] ?hepatitis   . History of dysphagia    due to radiation therapy  . History of hiatal hernia    small noted on PET scan  . Hypertension   . Hypothyroidism   . Lung cancer, lower lobe ([place]) 01/2017   radiation RX completed 03/04/17; will start chemo 6/27, pt unaware of lung cancer  . Morbid obesity ([place])    Status post lap band surgery  . Nephrolithiasis   . Non Hodgkin's lymphoma ([place])    on chemotherapy  . Persistent atrial fibrillation ([place])    a. s/p PVI 2008 b. s/p convergent ablation 7001 complicated by bradycardia requiring pacemaker implant  . Personal history of radiation therapy   . Presence of permanent cardiac pacemaker   . Rotator  cuff tear    Right  . Stroke [facility])    2003- [place] x2  . SVC syndrome    with lung mass and non hodgkins lymphoma  . Thyroid cancer ([place]) 2000   Past Surgical History:  Past Surgical History:  Procedure Laterality Date  . ABDOMINAL HYSTERECTOMY  1983  . afib ablation     a. 2008 PVI b. 2014 convergent ablation  . APPENDECTOMY    . BONE MARROW BIOPSY  02/21/2017  . BREAST LUMPECTOMY Left 2010  . bso  1998  . CARDIAC CATHETERIZATION     2015- negative  . CARDIOVERSION  10/09/2012   Procedure: CARDIOVERSION;  Surgeon: Minus Breeding, MD;  Location: [place];  Service: Cardiovascular;  Laterality: N/A;  . CARDIOVERSION  10/09/2012   Procedure: CARDIOVERSION;  Surgeon: Minus Breeding, MD;  Location: [facility] ENDOSCOPY;  Service: Cardiovascular;  Laterality: N/A;  [name] gave the ok to add pt to the add on , but we must check to find out if the can add pt on at [phone] ( [phone])  . CARDIOVERSION N/A 11/20/2012   Procedure: CARDIOVERSION;  Surgeon: Fay Records, MD;  Location: [facility] ENDOSCOPY;  Service: Cardiovascular;  Laterality: N/A;  . CARDIOVERSION N/A 07/18/2017   Procedure: CARDIOVERSION;  Surgeon: Thayer Headings, MD;  Location: [facility] ENDOSCOPY;  Service: Cardiovascular;  Laterality: N/A;  . CARDIOVERSION N/A 10/03/2017  Procedure: CARDIOVERSION;  Surgeon: Sanda Klein, MD;  Location: [facility] ENDOSCOPY;  Service: Cardiovascular;  Laterality: N/A;  . CARDIOVERSION N/A 01/07/2018   Procedure: CARDIOVERSION;  Surgeon: Thayer Headings, MD;  Location: [facility] ENDOSCOPY;  Service: Cardiovascular;  Laterality: N/A;  . CARDIOVERSION N/A 12/10/2019   Procedure: CARDIOVERSION;  Surgeon: Buford Dresser, MD;  Location: [facility] ENDOSCOPY;  Service: Cardiovascular;  Laterality: N/A;  . CHOLECYSTECTOMY    . COLONOSCOPY W/ POLYPECTOMY     Dr Earlean Shawl  . CYSTOSCOPY N/A 02/06/2015   Procedure: CYSTOSCOPY;  Surgeon: Kathie Rhodes, MD;  Location: [facility];  Service: Urology;  Laterality: N/A;  . CYSTOSCOPY W/ RETROGRADES Left  11/17/2017   Procedure: CYSTOSCOPY WITH RETROGRADE /PYELOGRAM/;  Surgeon: Kathie Rhodes, MD;  Location: [facility];  Service: Urology;  Laterality: Left;  . CYSTOSCOPY WITH RETROGRADE PYELOGRAM, URETEROSCOPY AND STENT PLACEMENT Right 02/06/2015   Procedure: RETROGRADE PYELOGRAM, RIGHT URETEROSCOPY STENT PLACEMENT;  Surgeon: Kathie Rhodes, MD;  Location: [facility];  Service: Urology;  Laterality: Right;  . CYSTOSCOPY WITH RETROGRADE PYELOGRAM, URETEROSCOPY AND STENT PLACEMENT Right 03/07/2017   Procedure: CYSTOSCOPY WITH RIGHT RETROGRADE PYELOGRAM,RIGHT  URETEROSCOPYLASER LITHOTRIPSY  AND STENT PLACEMENT AND STONE BASKETRY;  Surgeon: Kathie Rhodes, MD;  Location: [place];  Service: Urology;  Laterality: Right;  . EYE SURGERY     cataract surgery  . fatty mass removal  1999   pubic area  . HOLMIUM LASER APPLICATION N/A 7/82/4235   Procedure: HOLMIUM LASER APPLICATION;  Surgeon: Kathie Rhodes, MD;  [facility]: [facility];  Service: Urology;  Laterality: N/A;  . HOLMIUM LASER APPLICATION Right 3/61/4431   Procedure: HOLMIUM LASER APPLICATION;  Surgeon: Kathie Rhodes, MD;  Location[facility];  Service: Urology;  Laterality: Right;  . HOLMIUM LASER APPLICATION Left 5/40/0867   Procedure: HOLMIUM LASER APPLICATION;  Surgeon: Kathie Rhodes, MD;  [facility];  Service: Urology;  Laterality: Left;  . I & D EXTREMITY Left 12/19/2020   Procedure: IRRIGATION AND DEBRIDEMENT OF LEFT HIP HEMATOMA WITH APPLICATION OF WOUND VAC;  Surgeon: Erle Crocker, MD;  Location: [place];  Service: Orthopedics;  Laterality: Left;  . IR FLUORO GUIDE PORT INSERTION RIGHT  02/24/2017  . IR NEPHROSTOMY PLACEMENT RIGHT  11/17/2017  . IR PATIENT EVAL TECH 0-60 MINS  03/11/2017  . IR REMOVAL TUN ACCESS W/ PORT W/O FL MOD SED  04/20/2018  . IR US GUIDE VASC ACCESS RIGHT  02/24/2017  . KNEE ARTHROSCOPY     bilateral  . LAPAROSCOPIC GASTRIC BANDING  07/10/2010  . LAPAROSCOPIC GASTRIC BANDING      Laparoscopic adjustable banding APS System with posterior hiatal hernia, 2 suture.  Marland Kitchen LAPAROTOMY     for ruptured ovary and ovarian artery   . NEPHROLITHOTOMY Right 11/17/2017   Procedure: NEPHROLITHOTOMY PERCUTANEOUS;  Surgeon: Kathie Rhodes, MD;  [facility];  Service: Urology;  Laterality: Right;  . PACEMAKER INSERTION  03/10/2013   MDT dual chamber PPM  . POCKET REVISION N/A 12/08/2013   Procedure: POCKET REVISION;  Surgeon: Deboraha Sprang, MD;  Location: [facility] CATH LAB;  Service: Cardiovascular;  Laterality: N/A;  . PORTA CATH INSERTION    . REVERSE SHOULDER ARTHROPLASTY Right 05/14/2018   Procedure: RIGHT REVERSE SHOULDER ARTHROPLASTY;  Surgeon: Tania Ade, MD;  Location: [place];  Service: Orthopedics;  Laterality: Right;  . REVERSE SHOULDER REPLACEMENT Right 05/14/2018  . RIGHT HEART CATH N/A 07/21/2019   Procedure: RIGHT HEART CATH;  Surgeon: Larey Dresser, MD;  Location: [facility] INVASIVE CV  LAB;  Service: Cardiovascular;  Laterality: N/A;  . TEE WITH CARDIOVERSION  09/22/2017  . TEE WITHOUT CARDIOVERSION N/A 10/03/2017   Procedure: TRANSESOPHAGEAL ECHOCARDIOGRAM (TEE);  Surgeon: Sanda Klein, MD;  Location: [facility] ENDOSCOPY;  Service: Cardiovascular;  Laterality: N/A;  . TEE WITHOUT CARDIOVERSION N/A 08/04/2019   Procedure: TRANSESOPHAGEAL ECHOCARDIOGRAM (TEE);  Surgeon: Larey Dresser, MD;  Location: [facility] ENDOSCOPY;  Service: Cardiovascular;  Laterality: N/A;  . TEE WITHOUT CARDIOVERSION N/A 12/10/2019   Procedure: TRANSESOPHAGEAL ECHOCARDIOGRAM (TEE);  Surgeon: Buford Dresser, MD;  Location: [facility] ENDOSCOPY;  Service: Cardiovascular;  Laterality: N/A;  . THYROIDECTOMY  1998   Dr Margot Chimes  . TONSILLECTOMY    . TOTAL KNEE ARTHROPLASTY  04/13/2012   Procedure: TOTAL KNEE ARTHROPLASTY;  Surgeon: Rudean Haskell, MD;  Location: [place];  Service: Orthopedics;  Laterality: Right;  Marland Kitchen VIDEO BRONCHOSCOPY WITH ENDOBRONCHIAL ULTRASOUND N/A 02/07/2017   Procedure: VIDEO BRONCHOSCOPY WITH  ENDOBRONCHIAL ULTRASOUND;  Surgeon: Marshell Garfinkel, MD;  Location: [place];  Service: Pulmonary;  Laterality: N/A;    Assessment & Plan Clinical Impression: Patient is a 75 y.o. year old female with history of Lung cancer, NHL w/ SVC syndrome, lap band surgery, A fib--on Eliquis, chronic LBP, CVA with expressive deficits who was admitted on 12/15/20 after a fall with reports of HA, acute on chronic neck and back pain as well as increase in weakness. She was found to have non-displaced left coronoid process fracture with question of lucency over lateral aspect of radial head and large SQ hematoma left hip with moderate OA. Ortho evaluated patient and recommended WBAT left elbow and questioned need of I &D of left hip hematoma. Right rib pain treated with lidocaine patch. Eliquis held and she was placed on heparin bridge.   She continued to have severe hip pain with significant edema and concerns of skin necrosis. MRI left hip showed large hematoma left hip 10 cm in size, partial thickness tear of anterosuperior labrum and low grade partial tears of bilateral hamstring tendon origin. She was taken to OR on 03/29 for I & D of latera hip hematoma by Dr. Lucia Gaskins. Dr. Caryl Comes consulted for input on h/o recurrent falls and profound orthostatic hypotension with near syncope with activity. Florinef added and titrated upwards. Post op wound VAC removed 03/31 and wound closed. She has not had BM since surgery and Mag citrate added today. She is WBAT and showing improvement in activity tolerance but continues to be limited by weakness with mild dizziness and HA. CIR recommended due to functional decline.  Patient transferred to CIR on 12/22/2020 .   Patient currently requires min with mobility secondary to muscle weakness, decreased cardiorespiratoy endurance and decreased sitting balance, decreased standing balance, decreased postural control and decreased balance strategies.  Prior to hospitalization, patient was  modified independent  with mobility and lived with Alone in a Other(Comment) (condo) home.  Home access is  Other (comment) (small threshold).  Patient will benefit from skilled PT intervention to maximize safe functional mobility, minimize fall risk and decrease caregiver burden for planned discharge home with 24 hour supervision.  Anticipate patient will benefit from follow up [place] at discharge.  PT - End of Session Activity Tolerance: Tolerates 10 - 20 min activity with multiple rests Endurance Deficit: Yes PT Assessment Rehab Potential (ACUTE/IP ONLY): Good PT Barriers to Discharge: Decreased caregiver support;Lack of/limited family support;Home environment access/layout;Weight;Other (comments) PT Barriers to Discharge Comments: orthostatic hypotension with hx of at least 5 syncopal episodes with falls in the past  year per patient report PT Patient demonstrates impairments in the following area(s): Balance;Sensory;Skin Integrity;Edema;Endurance;Motor;Nutrition;Pain;Safety PT Transfers Functional Problem(s): Bed Mobility;Bed to Chair;Car;Furniture PT Locomotion Functional Problem(s): Ambulation;Wheelchair Mobility;Stairs PT Plan PT Intensity: Minimum of 1-2 x/day ,45 to 90 minutes PT Frequency: 5 out of 7 days PT Duration Estimated Length of Stay: 2 weeks PT Treatment/Interventions: Ambulation/gait training;Community reintegration;DME/adaptive equipment instruction;Neuromuscular re-education;Psychosocial support;Stair training;UE/LE Strength taining/ROM;Wheelchair propulsion/positioning;UE/LE Coordination activities;Therapeutic Activities;Skin care/wound management;Functional electrical stimulation;Pain management;Discharge planning;Balance/vestibular training;Disease management/prevention;Functional mobility training;Patient/family education;Splinting/orthotics;Therapeutic Exercise PT Transfers Anticipated Outcome(s): Mod I using LRAD PT Locomotion Anticipated Outcome(s): Supervision using LRAD  50 ft. PT Recommendation Follow Up Recommendations: Home health PT Patient destination: Home Equipment Recommended: To be determined Equipment Details: patient has RW   PT Evaluation Precautions/Restrictions Precautions Precautions: Fall Precaution Comments: orthostatic hypotension, B LE ACE wraps, Abdominal binder as approriate Restrictions LUE Weight Bearing: Weight bearing as tolerated Home Living/Prior Functioning Home Living Available Help at Discharge: Friend(s);Available PRN/intermittently (encouraged finding 24/7 supervision assist due to multiple falls in the past year secondary to syncope) Type of Home: Other(Comment) (condo) Home Access: Other (comment) (small threshold) Home Layout: One level  Lives With: Alone Prior Function Level of Independence: Independent with basic ADLs;Independent with gait;Independent with homemaking with ambulation;Independent with transfers;Requires assistive device for independence  Able to Take Stairs?: Yes Comments: RW for mobility.  she has used a cane in the past. Vision/Perception  Perception Perception: Within Functional Limits Praxis Praxis: Intact  Cognition Overall Cognitive Status: Within Functional Limits for tasks assessed Arousal/Alertness: Awake/alert Orientation Level: Oriented X4 Safety/Judgment: Appears intact Sensation Sensation Light Touch: Appears Intact Proprioception: Appears Intact Additional Comments: Reports infrequent myoclonic jerks of L arm in chest at night since admission Coordination Gross Motor Movements are Fluid and Coordinated: No Coordination and Movement Description: generalized deconditioning, morbid obesisty, baseline lymphedema, limited by orthostasis Motor  Motor Motor: Other (comment) Motor - Skilled Clinical Observations: Generalized deconditioning and weakness   Trunk/Postural Assessment  Cervical Assessment Cervical Assessment: Exceptions to [facility] (forward head) Thoracic  Assessment Thoracic Assessment: Exceptions to [facility] (mild kyphosis) Lumbar Assessment Lumbar Assessment: Exceptions to [facility] (lumbar lordosis) Postural Control Postural Control: Deficits on evaluation (decreased/delayed)  Balance Balance Balance Assessed: Yes Static Sitting Balance Static Sitting - Balance Support: Right upper extremity supported;Left upper extremity supported Static Sitting - Level of Assistance: 5: Stand by assistance Static Sitting - Comment/# of Minutes: symptomatic orthostasis that resolved sitting >3 min EOB Dynamic Sitting Balance Dynamic Sitting - Balance Support: Right upper extremity supported;Left upper extremity supported Dynamic Sitting - Level of Assistance: 5: Stand by assistance Static Standing Balance Static Standing - Balance Support: Bilateral upper extremity supported Static Standing - Level of Assistance: 4: Min assist Static Standing - Comment/# of Minutes: <2 min limited by symptomatic orthostasis requiring that patient return to sitting due to low BP and patient stating "I feel like I am going to pass out." Extremity Assessment  RLE Assessment RLE Assessment: Exceptions to [facility] Active Range of Motion (AROM) Comments: decreased hip flexion due to body habitus General Strength Comments: Grossly 4-/5 throughout in sitting LLE Assessment LLE Assessment: Exceptions to [facility] Active Range of Motion (AROM) Comments: decreased hip flexion due to body habitus General Strength Comments: Grossly 4-/5 throughout in sitting  Care Tool Care Tool Bed Mobility Roll left and right activity   Roll left and right assist level: Minimal Assistance - Patient > 75%    Sit to lying activity   Sit to lying assist level: Minimal Assistance - Patient > 75%  Lying to sitting edge of bed activity   Lying to sitting edge of bed assist level: Minimal Assistance - Patient > 75%     Care Tool Transfers Sit to stand transfer   Sit to stand assist level: Minimal Assistance  - Patient > 75%    Chair/bed transfer Chair/bed transfer activity did not occur: Safety/medical concerns (orthostatic on eval)       Psychologist, counselling transfer activity did not occur: Safety/medical concerns (orthostatic on eval)        Care Tool Locomotion Ambulation Ambulation activity did not occur: Safety/medical concerns (orthostatic on eval)        Walk 10 feet activity Walk 10 feet activity did not occur: Safety/medical concerns       Walk 50 feet with 2 turns activity Walk 50 feet with 2 turns activity did not occur: Safety/medical concerns      Walk 150 feet activity Walk 150 feet activity did not occur: Safety/medical concerns      Walk 10 feet on uneven surfaces activity Walk 10 feet on uneven surfaces activity did not occur: Safety/medical concerns      Stairs Stair activity did not occur: Safety/medical concerns        Walk up/down 1 step activity Walk up/down 1 step or curb (drop down) activity did not occur: Safety/medical concerns     Walk up/down 4 steps activity did not occuR: Safety/medical concerns  Walk up/down 4 steps activity      Walk up/down 12 steps activity Walk up/down 12 steps activity did not occur: Safety/medical concerns      Pick up small objects from floor Pick up small object from the floor (from standing position) activity did not occur: Safety/medical concerns      Wheelchair     Wheelchair activity did not occur: Safety/medical concerns (orthostatic on eval)      Wheel 50 feet with 2 turns activity Wheelchair 50 feet with 2 turns activity did not occur: Safety/medical concerns    Wheel 150 feet activity Wheelchair 150 feet activity did not occur: Safety/medical concerns      Refer to Care Plan for Long Term Goals  SHORT TERM GOAL WEEK 1 PT Short Term Goal 1 (Week 1): Patient will perform bed mobility with supervision consistently. PT Short Term Goal 2 (Week 1): Patient will perform basic transfers  with CGA using LRAD. PT Short Term Goal 3 (Week 1): Patient will perform standing tolerace without symptoms >4 min. PT Short Term Goal 4 (Week 1): Patient will ambulate >20 ft using LRAD with min A.  Recommendations for other services: None   Skilled Therapeutic Intervention Evaluation completed (see details above and below) with education on PT POC and goals and individual treatment initiated with focus on functional mobility/transfers, LE strength, dynamic standing balance/coordination, ambulation, stair navigation, simulated car transfers, and improved endurance with activity Patient provided with wheelchair with standard contour cushion and adjustments made to promote optimal seating posture and pressure distribution. Patient also provided with bariatric RW for use in room and therapist adjusted to proper height for patient.  Patient in bed upon PT arrival. Patient alert and agreeable to PT session. Patient reported 7/10 L hip pain during session, RN made aware. PT provided repositioning, rest breaks, and distraction as pain interventions throughout session.   Orthostatic Vitals: Supine: BP 112/52, HR 61 (asymptomatic) Sitting: BP 95/49, HR 64 (mild symptoms) Sitting x3 min: BP 100/55, HR 66 (asymptomatic) Standing:  BP 78/42, HR 72 (8/10 symptoms of light headedness, patient reports she feels like she (is going to pass out." Seated in chair position at end of session: BP 145/66, HR 71 (asymptomatic)  Therapeutic Activity: Bed Mobility: Patient performed bed mobility as described above in a flat bed without use of bed rails. Performed rolling x3 progressing to supervision with toileting using the bed pan, provided cues for bringing opposite knee across for increased roll. Patient continent and incontinent of bladder, reports poor sensation of then she had to void. Utilized bed pan with total A due to urgency and hx of orthostatic hypotension. Peri-care performed with total A and gown and bed  linens changes with NT assistance during session.  Transfers: Patient performed sit to/from stand x1 as described above. Patient with significant symptoms and drop in BP, see vitals above. Patient returned to sitting then lying.  Placed patient in the chair position in the bed for improved vertical tolerance in a seated and supported position, RN made aware.   Patient required significant time for all mobility due to deconditioning and fear of symptomatic mobility. Patient remained verbose throughout session, requiring redirection and encouragement to continue with activities.   Instructed pt in results of PT evaluation as detailed above, PT POC, rehab potential, rehab goals, and discharge recommendations. Additionally discussed CIR's policies regarding fall safety and use of chair alarm and/or quick release belt. Pt verbalized understanding and in agreement. Will update pt's family members as they become available.   Patient in seated position in the bed at end of session with breaks locked, bed alarm set, and all needs within reach.    Mobility Bed Mobility Bed Mobility: Rolling Right;Rolling Left;Sit to Supine;Supine to Sit Rolling Right: Minimal Assistance - Patient > 75% Rolling Left: Minimal Assistance - Patient > 75% Supine to Sit: Minimal Assistance - Patient > 75% Sit to Supine: Minimal Assistance - Patient > 75% Transfers Transfers: Sit to Stand;Stand to Sit Sit to Stand: Minimal Assistance - Patient > 75% Stand to Sit: Minimal Assistance - Patient > 75% Transfer (Assistive device): Rolling walker (bariatric RW) Locomotion  Gait Ambulation: No (unable do to symptomatic orthostasis in standing)   Discharge Criteria: Patient will be discharged from PT if patient refuses treatment 3 consecutive times without medical reason, if treatment goals not met, if there is a change in medical status, if patient makes no progress towards goals or if patient is discharged from  hospital.  The above assessment, treatment plan, treatment alternatives and goals were discussed and mutually agreed upon: by patient  Doreene Burke PT, DPT  12/24/2020, 12:43 PM

## 2020-12-24 NOTE — Evaluation (Signed)
Occupational Therapy Assessment and Plan  Patient Details  Name: Kayla Harrison MRN: [id number] Date of Birth: February 24, 1946  OT Diagnosis: abnormal posture, acute pain, muscle weakness (generalized), other lymphedema, pain in joint, swelling of limb and impaired sensation Rehab Potential: Rehab Potential (ACUTE ONLY): Good ELOS: 12-14 days   Today's Date: 12/24/2020 OT Individual Time: 1330-1430 OT Individual Time Calculation (min): 60 min     Hospital Problem: Principal Problem:   Tear of left hamstring Active Problems:   Physical debility   Labral tear of left hip joint   Acute blood loss anemia   Past Medical History:  Past Medical History:  Diagnosis Date  . Anemia   . Arthritis    osteoarthritis - knees and right shoulder  . Blood transfusion without reported diagnosis   . Breast cancer ([place])    Dr Margot Chimes, total thyroidectomy- 1999- for cancer  . 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 Chronic bilateral pleural effusions   . Colon polyp    Dr Earlean Shawl  . Complication of anesthesia    Ketamine produces LSD reaction, bright colored nightmarish experience   . Dyslipidemia   . Endometriosis   . Fibroids   . H/O pleural effusion    s/p thoracentesis w 3258m withdrawn  . Hepatitis    [place]hepatitis   . History of dysphagia    due to radiation therapy  . History of hiatal hernia    small noted on PET scan  . Hypertension   . Hypothyroidism   . Lung cancer, lower lobe ([facility] 01/2017   radiation RX completed 03/04/17; will start chemo 6/27, pt unaware of lung cancer  . Morbid obesity ([place]    Status post lap band surgery  . Nephrolithiasis   . [place] Hod[place]kin's [place]    on chemotherapy  . Persistent atrial fibrillation ([place]    a. s/p PVI 2008 b. s/p convergent ablation 28099complicated by bradycardia requiring pacemaker implant  . Personal history of radiation therapy   . Presence of permanent cardiac pacemaker   . Rotator cuff tear     Right  . Stroke ([facility]    [place]  . SVC syndrome    with lung mass and non hodgkins lymphoma  . Thyroid cancer ([place] 2000   Past Surgical History:  Past Surgical History:  Procedure Laterality Date  . ABDOMINAL HYSTERECTOMY  1983  . afib ablation     a. 2008 PVI b. 2014 convergent ablation  . APPENDECTOMY    . BONE MARROW BIOPSY  02/21/2017  . BREAST LUMPECTOMY Left 2010  . bso  1998  . CARDIAC CATHETERIZATION     2015- negative  . CARDIOVERSION  10/09/2012   Procedure: CARDIOVERSION;  Surgeon: JMinus Breeding MD;  [place]  Service: Cardiovascular;  Laterality: N/A;  . CARDIOVERSION  10/09/2012   Procedure: CARDIOVERSION;  Surgeon: JMinus Breeding MD;  [facility];  Service: Cardiovascular;  Laterality: N/A;  [name] the ok to add pt to the add on , but we must check to find out if the can add pt on at [phone] ( [phone])  . CARDIOVERSION N/A 11/20/2012   Procedure: CARDIOVERSION;  Surgeon: PFay Records MD;  [facility];  Service: Cardiovascular;  Laterality: N/A;  . CARDIOVERSION N/A 07/18/2017   Procedure: CARDIOVERSION;  Surgeon: NThayer Headings MD;  [facility];  Service: Cardiovascular;  Laterality: N/A;  . CARDIOVERSION N/A 10/03/2017   Procedure: CARDIOVERSION;  Surgeon:  Croitoru, Dani Gobble, MD;  Location: [place];  Service: Cardiovascular;  Laterality: N/A;  . CARDIOVERSION N/A 01/07/2018   Procedure: CARDIOVERSION;  Surgeon: Thayer Headings, MD;  Location: [facility] ENDOSCOPY;  Service: Cardiovascular;  Laterality: N/A;  . CARDIOVERSION N/A 12/10/2019   Procedure: CARDIOVERSION;  Surgeon: Buford Dresser, MD;  [facility];  Service: Cardiovascular;  Laterality: N/A;  . CHOLECYSTECTOMY    . COLONOSCOPY W/ POLYPECTOMY     Dr Earlean Shawl  . CYSTOSCOPY N/A 02/06/2015   Procedure: CYSTOSCOPY;  Surgeon: Kathie Rhodes, MD;  [facility];  Service: Urology;  Laterality: N/A;  . CYSTOSCOPY W/ RETROGRADES Left 11/17/2017    Procedure: CYSTOSCOPY WITH RETROGRADE /PYELOGRAM/;  Surgeon: Kathie Rhodes, MD;  [facility];  Service: Urology;  Laterality: Left;  . CYSTOSCOPY WITH RETROGRADE PYELOGRAM, URETEROSCOPY AND STENT PLACEMENT Right 02/06/2015   Procedure: RETROGRADE PYELOGRAM, RIGHT URETEROSCOPY STENT PLACEMENT;  Surgeon: Kathie Rhodes, MD;  [facility];  Service: Urology;  Laterality: Right;  . CYSTOSCOPY WITH RETROGRADE PYELOGRAM, URETEROSCOPY AND STENT PLACEMENT Right 03/07/2017   Procedure: CYSTOSCOPY WITH RIGHT RETROGRADE PYELOGRAM,RIGHT  URETEROSCOPYLASER LITHOTRIPSY  AND STENT PLACEMENT AND STONE BASKETRY;  Surgeon: Kathie Rhodes, MD;  Location: [place];  Service: Urology;  Laterality: Right;  . EYE SURGERY     cataract surgery  . fatty mass removal  1999   pubic area  . HOLMIUM LASER APPLICATION N/A 2/70/6237   Procedure: HOLMIUM LASER APPLICATION;  Surgeon: Kathie Rhodes, MD;  [facility];  Service: Urology;  Laterality: N/A;  . HOLMIUM LASER APPLICATION Right 03/20/3150   Procedure: HOLMIUM LASER APPLICATION;  Surgeon: Kathie Rhodes, MD;  Location: [facility];  Service: Urology;  Laterality: Right;  . HOLMIUM LASER APPLICATION Left 7/61/6073   Procedure: HOLMIUM LASER APPLICATION;  Surgeon: Kathie Rhodes, MD;  [facility];  Service: Urology;  Laterality: Left;  . I & D EXTREMITY Left 12/19/2020   Procedure: IRRIGATION AND DEBRIDEMENT OF LEFT HIP HEMATOMA WITH APPLICATION OF WOUND VAC;  Surgeon: Erle Crocker, MD;  Location: [place];  Service: Orthopedics;  Laterality: Left;  . IR FLUORO GUIDE PORT INSERTION RIGHT  02/24/2017  . IR NEPHROSTOMY PLACEMENT RIGHT  11/17/2017  . IR PATIENT EVAL TECH 0-60 MINS  03/11/2017  . IR REMOVAL TUN ACCESS W/ PORT W/O FL MOD SED  04/20/2018  . IR US GUIDE VASC ACCESS RIGHT  02/24/2017  . KNEE ARTHROSCOPY     bilateral  . LAPAROSCOPIC GASTRIC BANDING  07/10/2010  . LAPAROSCOPIC GASTRIC BANDING     Laparoscopic adjustable  banding APS System with posterior hiatal hernia, 2 suture.  Marland Kitchen LAPAROTOMY     for ruptured ovary and ovarian artery   . NEPHROLITHOTOMY Right 11/17/2017   Procedure: NEPHROLITHOTOMY PERCUTANEOUS;  Surgeon: Kathie Rhodes, MD;  [facility];  Service: Urology;  Laterality: Right;  . PACEMAKER INSERTION  03/10/2013   MDT dual chamber PPM  . POCKET REVISION N/A 12/08/2013   Procedure: POCKET REVISION;  Surgeon: Deboraha Sprang, MD;  Location: [facility] CATH LAB;  Service: Cardiovascular;  Laterality: N/A;  . PORTA CATH INSERTION    . REVERSE SHOULDER ARTHROPLASTY Right 05/14/2018   Procedure: RIGHT REVERSE SHOULDER ARTHROPLASTY;  Surgeon: Tania Ade, MD;  Location: [place];  Service: Orthopedics;  Laterality: Right;  . REVERSE SHOULDER REPLACEMENT Right 05/14/2018  . RIGHT HEART CATH N/A 07/21/2019   Procedure: RIGHT HEART CATH;  Surgeon: Larey Dresser, MD;  Location: [place] CV LAB;  Service: Cardiovascular;  Laterality: N/A;  . TEE WITH CARDIOVERSION  09/22/2017  . TEE WITHOUT CARDIOVERSION N/A 10/03/2017   Procedure: TRANSESOPHAGEAL ECHOCARDIOGRAM (TEE);  Surgeon: Sanda Klein, MD;  Location: [facility] ENDOSCOPY;  Service: Cardiovascular;  Laterality: N/A;  . TEE WITHOUT CARDIOVERSION N/A 08/04/2019   Procedure: TRANSESOPHAGEAL ECHOCARDIOGRAM (TEE);  Surgeon: Larey Dresser, MD;  Location: [facility] ENDOSCOPY;  Service: Cardiovascular;  Laterality: N/A;  . TEE WITHOUT CARDIOVERSION N/A 12/10/2019   Procedure: TRANSESOPHAGEAL ECHOCARDIOGRAM (TEE);  Surgeon: Buford Dresser, MD;  Location: [facility] ENDOSCOPY;  Service: Cardiovascular;  Laterality: N/A;  . THYROIDECTOMY  1998   Dr Margot Chimes  . TONSILLECTOMY    . TOTAL KNEE ARTHROPLASTY  04/13/2012   Procedure: TOTAL KNEE ARTHROPLASTY;  Surgeon: Rudean Haskell, MD;  Location: [place];  Service: Orthopedics;  Laterality: Right;  Marland Kitchen VIDEO BRONCHOSCOPY WITH ENDOBRONCHIAL ULTRASOUND N/A 02/07/2017   Procedure: VIDEO BRONCHOSCOPY WITH ENDOBRONCHIAL ULTRASOUND;   Surgeon: Marshell Garfinkel, MD;  Location: [place];  Service: Pulmonary;  Laterality: N/A;    Assessment & Plan Clinical Impression: Kayla Harrison is a 75 year old female with history of Lung cancer, NHL w/ SVC syndrome, lap band surgery, A fib--on Eliquis, chronic LBP, CVA with expressive deficits who was admitted on 12/15/20 after a fall with reports of HA, acute on chronic neck and back pain as well as increase in weakness. She was found to have non-displaced left coronoid process fracture with question of lucency over lateral aspect of radial head and large SQ hematoma left hip with moderate OA. Ortho evaluated patient and recommended WBAT left elbow and questioned need of I &D of left hip hematoma. Right rib pain treated with lidocaine patch. Eliquis held and she was placed on heparin bridge.   She continued to have severe hip pain with significant edema and concerns of skin necrosis. MRI left hip showed large hematoma left hip 10 cm in size, partial thickness tear of anterosuperior labrum and low grade partial tears of bilateral hamstring tendon origin. She was taken to OR on 03/29 for I & D of latera hip hematoma by Dr. Lucia Gaskins. Dr. Caryl Comes consulted for input on h/o recurrent falls and profound orthostatic hypotension with near syncope with activity. Florinef added and titrated upwards. Post op wound VAC removed 03/31 and wound closed. She has not had BM since surgery and Mag citrate added today. She is WBAT and showing improvement in activity tolerance but continues to be limited by weakness with mild dizziness and HA. CIR recommended due to functional decline.   Patient currently requires max with basic self-care skills secondary to muscle weakness, decreased cardiorespiratoy endurance and decreased standing balance, decreased postural control and decreased balance strategies.  Prior to hospitalization, patient could complete BADLs with modified independent .  Patient will benefit from  skilled intervention to increase independence with basic self-care skills prior to discharge home with support of friends.  Anticipate patient will require 24 hour supervision and follow up home health.  OT - End of Session Endurance Deficit: Yes (pt with limited tolerance sitting EOB) OT Assessment Rehab Potential (ACUTE ONLY): Good OT Barriers to Discharge: Decreased caregiver support;Lack of/limited family support;Weight;Incontinence;Wound Care OT Patient demonstrates impairments in the following area(s): Balance;Edema;Endurance;Motor;Pain;Safety;Sensory;Skin Integrity OT Basic ADL's Functional Problem(s): Grooming;Bathing;Dressing;Toileting OT Advanced ADL's Functional Problem(s): Simple Meal Preparation OT Transfers Functional Problem(s): Toilet OT Additional Impairment(s): Fuctional Use of Upper Extremity (pt reports her handwriting is illegible) OT Plan OT Intensity: Minimum of 1-2 x/day, 45 to 90 minutes OT Frequency: 5 out of 7 days  OT Duration/Estimated Length of Stay: 12-14 days OT Treatment/Interventions: Balance/vestibular training;DME/adaptive equipment instruction;Patient/family education;Therapeutic Activities;Psychosocial support;Therapeutic Exercise;UE/LE Strength taining/ROM;Self Care/advanced ADL retraining;Functional mobility training;Community reintegration;Discharge planning;Skin care/wound managment;UE/LE Coordination activities;Pain management;Disease mangement/prevention OT Self Feeding Anticipated Outcome(s): No goal OT Basic Self-Care Anticipated Outcome(s): Supervision-Min A OT Toileting Anticipated Outcome(s): Supervision OT Bathroom Transfers Anticipated Outcome(s): Supervision OT Recommendation Recommendations for Other Services: Neuropsych consult;Therapeutic Recreation consult Therapeutic Recreation Interventions: Kitchen group;Stress management Patient destination: Home Follow Up Recommendations: Home health OT Equipment Recommended: To be  determined   OT Evaluation Precautions/Restrictions  Precautions Precautions: Fall Precaution Comments: orthostatic hypotension, B LE ACE wraps, Abdominal binder as approriate Restrictions Weight Bearing Restrictions: Yes LUE Weight Bearing: Weight bearing as tolerated Vital Signs Therapy Vitals Temp: 98.3 F (36.8 C) Pulse Rate: 69 Resp: 17 BP: 134/65 Patient Position (if appropriate): Lying Oxygen Therapy SpO2: 98 % O2 Device: Room Air Home Living/Prior Functioning Home Living Available Help at Discharge: Friend(s),Available PRN/intermittently Type of Home: Other(Comment) (condo) Home Access: Other (comment) (small threshold) Home Layout: One level Bathroom Shower/Tub: Multimedia programmer: Handicapped height Bathroom Accessibility: Yes Additional Comments: Used a rollator when completing ADLs/IADLs in the home  Lives With: Alone IADL History Homemaking Responsibilities: Yes Meal Prep Responsibility: Primary Laundry Responsibility: Primary Cleaning Responsibility: Primary Bill Paying/Finance Responsibility: Primary Shopping Responsibility: Primary Occupation: Retired Type of Occupation: Med Designer, multimedia, 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  she has used a cane in the past. Vision Baseline Vision/History: Wears glasses Wears Glasses: Reading only Patient Visual Report: Blurring of vision Perception  Perception: Within Functional Limits Praxis Praxis: Intact Cognition Overall Cognitive Status: Within Functional Limits for tasks assessed Arousal/Alertness: Awake/alert Orientation Level: Person;Place;Situation Person: Oriented Place: Oriented Situation: Oriented Year: 2022 Month: April Day of Week:  Correct Immediate Memory Recall: Sock;Blue;Bed Memory Recall Sock: Without Cue Memory Recall Blue: Without Cue Memory Recall Bed: Without Cue Safety/Judgment: Appears intact Sensation Sensation Light Touch: Impaired by gross assessment (decreased appreciation to light touch in B LEs, pt reports numbness/tingling sensation) Proprioception: Appears Intact Additional Comments: Reports infrequent myoclonic jerks of L arm in chest at night since admission Coordination Gross Motor Movements are Fluid and Coordinated: No Fine Motor Movements are Fluid and Coordinated: No Coordination and Movement Description: generalized deconditioning, morbid obesisty, baseline lymphedema, limited by orthostasis Finger Nose Finger Test: Tremulous Lt>Rt Motor  Motor Motor: Other (comment) Motor - Skilled Clinical Observations: Generalized deconditioning and weakness, arthritis + tremors in hands  Trunk/Postural Assessment  Cervical Assessment Cervical Assessment: Exceptions to [facility] (forward head) Thoracic Assessment Thoracic Assessment: Exceptions to [facility] (kyphotic) Lumbar Assessment Lumbar Assessment: Exceptions to [facility] (lordosis) Postural Control Postural Control: Deficits on evaluation (limited in sitting/standing)  Balance Balance Balance Assessed: Yes Static Sitting Balance Static Sitting - Balance Support: Right upper extremity supported;Left upper extremity supported Static Sitting - Level of Assistance: 5: Stand by assistance Static Sitting - Comment/# of Minutes: symptomatic orthostasis that resolved sitting >3 min EOB Dynamic Sitting Balance Dynamic Sitting - Balance Support: During functional activity Dynamic Sitting - Level of Assistance: 5: Stand by assistance (removing gripper socks from feet) Static Standing Balance Static Standing - Balance Support: Bilateral upper extremity supported Static Standing - Level of Assistance: 4: Min assist (sit<stand during bathing) Static Standing -  Comment/# of Minutes: <2 min limited by symptomatic orthostasis requiring that  patient return to sitting due to low BP and patient stating "I feel like I am going to pass out." Extremity/Trunk Assessment RUE Assessment RUE Assessment: Not tested (unable to thoroughly assess, pt with mild tremor activity and mild arthritic deformity) LUE Assessment LUE Assessment: Not tested (unable to thoroughly assess, pt with moderate tremor activity)  Care Tool Care Tool Self Care Eating       not assessed  Oral Care  Oral care, brush teeth, clean dentures activity did not occur: Refused (pt reported feeling too tired to attempt)      Bathing   Body parts bathed by patient: Right arm;Left arm;Chest;Abdomen;Right upper leg;Left upper leg;Face Body parts bathed by helper: Front perineal area;Buttocks;Right lower leg;Left lower leg   Assist Level: 2 Helpers    Upper Body Dressing(including orthotics)   What is the patient wearing?: Dress   Assist Level: Moderate Assistance - Patient 50 - 74%    Lower Body Dressing (excluding footwear)   What is the patient wearing?: Incontinence brief Assist for lower body dressing: 2 Helpers    Putting on/Taking off footwear   What is the patient wearing?: Non-skid slipper socks Assist for footwear: Dependent - Patient 0%       Care Tool Toileting Toileting activity   Assist for toileting: 2 Helpers     Care Tool Bed Mobility Roll left and right activity   Roll left and right assist level: Minimal Assistance - Patient > 75%    Sit to lying activity   Sit to lying assist level: Minimal Assistance - Patient > 75%    Lying to sitting edge of bed activity   Lying to sitting edge of bed assist level: Minimal Assistance - Patient > 75%     Care Tool Transfers Sit to stand transfer   Sit to stand assist level: Minimal Assistance - Patient > 75%    Chair/bed transfer Chair/bed transfer activity did not occur: Safety/medical concerns (orthostatic on  eval)       Toilet transfer Toilet transfer activity did not occur: Safety/medical concerns (due to fatigue and pts limited standing endurance)        Refer to Care Plan for Long Term Goals  SHORT TERM GOAL WEEK 1 OT Short Term Goal 1 (Week 1): Pt will complete toilet transfer with 1 assist and LRAD OT Short Term Goal 2 (Week 1): Pt will complete LB dressing at sit<stand level to increase standing tolerance OT Short Term Goal 3 (Week 1): Pt will complete 1 grooming task while sitting at the sink to increase OOB tolerance  Recommendations for other services: Neuropsych and Therapeutic Recreation  Kitchen group and Stress management   Skilled Therapeutic Intervention Skilled Therapeutic intervention completed with focus on initial evaluation, education on OT role/POC, and establishment of patient-centered goals.   Pt greeted in bed, reporting 4/10 pain in the Lt hip which increases to 8/10 with activity. Rest breaks and ADL modifications used as pain mgt interventions during session. She was agreeable to engage in UB self care EOB. Pt directed care in regards to bed setup, pt rising on her Rt side with supervision assist. Setup for UB bathing and pt able to doff gripper socks and assist with washing lower legs. Close supervision for balance at this time. Note that pt has significant LE edema bilaterally. She needed assistance for thoroughly washing legs and also for donning footwear. Sit<stand with Min A using RW to complete perihygiene when pt reported urinary incontinence. She sat back down after  and reported she was urinating again, asking to return to bed. When cued to laterally scoot up towards [facility], pt reported she felt too tired to complete/assist. +2 for sit<supine and for boosting pt up in bed. +2 for hygiene with pt rolling Rt>Lt with Mod A. Note she had 2x more episodes of voiding bladder, pt reporting "I'm peeing" and within seconds voiding. Pt only able to tolerate sidelying for brief  windows of time. Per pt, "I just have no strength." She also reported feeling too tired to be set up with oral care either EOB or bedlevel. Pt was left in care of NT at end of tx for obtaining vital signs.   Pt with no s/s orthostatics during session, though was often asked  ADL ADL Eating: Not assessed Grooming: Minimal assistance Where Assessed-Grooming: Edge of bed Upper Body Bathing: Supervision/safety Where Assessed-Upper Body Bathing: Edge of bed Lower Body Bathing: Dependent (+2 assist) Where Assessed-Lower Body Bathing: Edge of bed;Bed level Upper Body Dressing: Moderate assistance Where Assessed-Upper Body Dressing: Bed level Lower Body Dressing: Dependent (+2 assist) Where Assessed-Lower Body Dressing: Edge of bed;Bed level Toileting: Dependent (+2 assist) Where Assessed-Toileting: Bed level Toilet Transfer: Not assessed Tub/Shower Transfer: Not assessed Mobility  Mod A rolling Rt>Lt during pericare  Discharge Criteria: Patient will be discharged from OT if patient refuses treatment 3 consecutive times without medical reason, if treatment goals not met, if there is a change in medical status, if patient makes no progress towards goals or if patient is discharged from hospital.  The above assessment, treatment plan, treatment alternatives and goals were discussed and mutually agreed upon: by patient  Skeet Simmer 12/24/2020, 3:20 PM

## 2020-12-24 NOTE — Plan of Care (Signed)
  Problem: Sit to Stand Goal: LTG:  Patient will perform sit to stand in prep for activites of daily living with assistance level (OT) Description: LTG:  Patient will perform sit to stand in prep for activites of daily living with assistance level (OT) Flowsheets (Taken 12/24/2020 1456) LTG: PT will perform sit to stand in prep for activites of daily living with assistance level: Supervision/Verbal cueing   Problem: RH Grooming Goal: LTG Patient will perform grooming w/assist,cues/equip (OT) Description: LTG: Patient will perform grooming with assist, with/without cues using equipment (OT) Flowsheets (Taken 12/24/2020 1456) LTG: Pt will perform grooming with assistance level of: Set up assist    Problem: RH Bathing Goal: LTG Patient will bathe all body parts with assist levels (OT) Description: LTG: Patient will bathe all body parts with assist levels (OT) Flowsheets (Taken 12/24/2020 1456) LTG: Pt will perform bathing with assistance level/cueing: Minimal Assistance - Patient > 75%   Problem: RH Dressing Goal: LTG Patient will perform upper body dressing (OT) Description: LTG Patient will perform upper body dressing with assist, with/without cues (OT). Flowsheets (Taken 12/24/2020 1456) LTG: Pt will perform upper body dressing with assistance level of: Set up assist Goal: LTG Patient will perform lower body dressing w/assist (OT) Description: LTG: Patient will perform lower body dressing with assist, with/without cues in positioning using equipment (OT) Flowsheets (Taken 12/24/2020 1456) LTG: Pt will perform lower body dressing with assistance level of: Minimal Assistance - Patient > 75%   Problem: RH Toileting Goal: LTG Patient will perform toileting task (3/3 steps) with assistance level (OT) Description: LTG: Patient will perform toileting task (3/3 steps) with assistance level (OT)  Flowsheets (Taken 12/24/2020 1456) LTG: Pt will perform toileting task (3/3 steps) with assistance level:  Supervision/Verbal cueing   Problem: RH Toilet Transfers Goal: LTG Patient will perform toilet transfers w/assist (OT) Description: LTG: Patient will perform toilet transfers with assist, with/without cues using equipment (OT) Flowsheets (Taken 12/24/2020 1456) LTG: Pt will perform toilet transfers with assistance level of: Supervision/Verbal cueing

## 2020-12-25 ENCOUNTER — Encounter (HOSPITAL_COMMUNITY): Payer: Self-pay | Admitting: Physical Medicine and Rehabilitation

## 2020-12-25 MED ORDER — ACETAMINOPHEN 325 MG PO TABS
650.0000 mg | ORAL_TABLET | Freq: Three times a day (TID) | ORAL | Status: DC
  Administered 2020-12-25 – 2021-01-01 (×20): 650 mg via ORAL
  Filled 2020-12-25 (×21): qty 2

## 2020-12-25 NOTE — Progress Notes (Signed)
Inpatient Rehabilitation Care Coordinator Assessment and Plan Patient Details  Name: Kayla Harrison MRN: [id number] Date of Birth: 10/20/1945  Today's Date: 12/25/2020  Hospital Problems: Principal Problem:   Physical debility Active Problems:   Constipation   Orthostatic hypotension   Tear of left hamstring   Labral tear of left hip joint   Acute blood loss anemia  Past Medical History:  Past Medical History:  Diagnosis Date  . Anemia   . Arthritis    osteoarthritis - knees and right shoulder  . Blood transfusion without reported diagnosis   . Breast cancer ([place])    Dr Margot Chimes, total thyroidectomy- 1999- for cancer  . 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 Chronic bilateral pleural effusions   . Colon polyp    Dr Earlean Shawl  . Complication of anesthesia    Ketamine produces LSD reaction, bright colored nightmarish experience   . Dyslipidemia   . Endometriosis   . Fibroids   . H/O pleural effusion    s/p thoracentesis w 3219ml withdrawn  . Hepatitis    [place] ?hepatitis   . History of dysphagia    due to radiation therapy  . History of hiatal hernia    small noted on PET scan  . Hypertension   . Hypothyroidism   . Lung cancer, lower lobe ([place]) 01/2017   radiation RX completed 03/04/17; will start chemo 6/27, pt unaware of lung cancer  . Morbid obesity ([place])    Status post lap band surgery  . Nephrolithiasis   . Non Hodgkin's lymphoma ([place])    on chemotherapy  . Persistent atrial fibrillation ([place])    a. s/p PVI 2008 b. s/p convergent ablation 4098 complicated by bradycardia requiring pacemaker implant  . Personal history of radiation therapy   . Presence of permanent cardiac pacemaker   . Rotator cuff tear    Right  . Stroke [facility])    2003- [place] x2  . SVC syndrome    with lung mass and non hodgkins lymphoma  . Thyroid cancer ([place]) 2000   Past Surgical History:  Past Surgical History:  Procedure Laterality Date  . ABDOMINAL  HYSTERECTOMY  1983  . afib ablation     a. 2008 PVI b. 2014 convergent ablation  . APPENDECTOMY    . BONE MARROW BIOPSY  02/21/2017  . BREAST LUMPECTOMY Left 2010  . bso  1998  . CARDIAC CATHETERIZATION     2015- negative  . CARDIOVERSION  10/09/2012   Procedure: CARDIOVERSION;  Surgeon: Minus Breeding, MD;  Location: [place];  Service: Cardiovascular;  Laterality: N/A;  . CARDIOVERSION  10/09/2012   Procedure: CARDIOVERSION;  Surgeon: Minus Breeding, MD;  Location: [facility] ENDOSCOPY;  Service: Cardiovascular;  Laterality: N/A;  [name] gave the ok to add pt to the add on , but we must check to find out if the can add pt on at [phone] ( [phone])  . CARDIOVERSION N/A 11/20/2012   Procedure: CARDIOVERSION;  Surgeon: Fay Records, MD;  Location: [facility] ENDOSCOPY;  Service: Cardiovascular;  Laterality: N/A;  . CARDIOVERSION N/A 07/18/2017   Procedure: CARDIOVERSION;  Surgeon: Thayer Headings, MD;  Location: [facility] ENDOSCOPY;  Service: Cardiovascular;  Laterality: N/A;  . CARDIOVERSION N/A 10/03/2017   Procedure: CARDIOVERSION;  Surgeon: Sanda Klein, MD;  Location: [facility] ENDOSCOPY;  Service: Cardiovascular;  Laterality: N/A;  . CARDIOVERSION N/A 01/07/2018   Procedure: CARDIOVERSION;  Surgeon: Acie Fredrickson Wonda Cheng, MD;  Location: [place];  Service: Cardiovascular;  Laterality: N/A;  .  CARDIOVERSION N/A 12/10/2019   Procedure: CARDIOVERSION;  Surgeon: Jodelle Red, MD;  Location: [facility] ENDOSCOPY;  Service: Cardiovascular;  Laterality: N/A;  . CHOLECYSTECTOMY    . COLONOSCOPY W/ POLYPECTOMY     Dr Kinnie Scales  . CYSTOSCOPY N/A 02/06/2015   Procedure: CYSTOSCOPY;  Surgeon: Ihor Gully, MD;  Location: [facility];  Service: Urology;  Laterality: N/A;  . CYSTOSCOPY W/ RETROGRADES Left 11/17/2017   Procedure: CYSTOSCOPY WITH RETROGRADE /PYELOGRAM/;  Surgeon: Ihor Gully, MD;  Location: [facility];  Service: Urology;  Laterality: Left;  . CYSTOSCOPY WITH RETROGRADE PYELOGRAM, URETEROSCOPY AND STENT PLACEMENT Right 02/06/2015    Procedure: RETROGRADE PYELOGRAM, RIGHT URETEROSCOPY STENT PLACEMENT;  Surgeon: Ihor Gully, MD;  Location: [facility];  Service: Urology;  Laterality: Right;  . CYSTOSCOPY WITH RETROGRADE PYELOGRAM, URETEROSCOPY AND STENT PLACEMENT Right 03/07/2017   Procedure: CYSTOSCOPY WITH RIGHT RETROGRADE PYELOGRAM,RIGHT  URETEROSCOPYLASER LITHOTRIPSY  AND STENT PLACEMENT AND STONE BASKETRY;  Surgeon: Ihor Gully, MD;  Location: [facility];  Service: Urology;  Laterality: Right;  . EYE SURGERY     cataract surgery  . fatty mass removal  1999   pubic area  . HOLMIUM LASER APPLICATION N/A 02/06/2015   Procedure: HOLMIUM LASER APPLICATION;  Surgeon: Ihor Gully, MD;  [facility]: [facility];  Service: Urology;  Laterality: N/A;  . HOLMIUM LASER APPLICATION Right 03/07/2017   Procedure: HOLMIUM LASER APPLICATION;  Surgeon: Ihor Gully, MD;  Location: [facility];  Service: Urology;  Laterality: Right;  . HOLMIUM LASER APPLICATION Left 11/17/2017   Procedure: HOLMIUM LASER APPLICATION;  Surgeon: Ihor Gully, MD;  [facility];  Service: Urology;  Laterality: Left;  . I & D EXTREMITY Left 12/19/2020   Procedure: IRRIGATION AND DEBRIDEMENT OF LEFT HIP HEMATOMA WITH APPLICATION OF WOUND VAC;  Surgeon: Terance Hart, MD;  Location: [facility] OR;  Service: Orthopedics;  Laterality: Left;  . IR FLUORO GUIDE PORT INSERTION RIGHT  02/24/2017  . IR NEPHROSTOMY PLACEMENT RIGHT  11/17/2017  . IR PATIENT EVAL TECH 0-60 MINS  03/11/2017  . IR REMOVAL TUN ACCESS W/ PORT W/O FL MOD SED  04/20/2018  . IR US GUIDE VASC ACCESS RIGHT  02/24/2017  . KNEE ARTHROSCOPY     bilateral  . LAPAROSCOPIC GASTRIC BANDING  07/10/2010  . LAPAROSCOPIC GASTRIC BANDING     Laparoscopic adjustable banding APS System with posterior hiatal hernia, 2 suture.  Marland Kitchen LAPAROTOMY     for ruptured ovary and ovarian artery   . NEPHROLITHOTOMY Right 11/17/2017   Procedure: NEPHROLITHOTOMY PERCUTANEOUS;  Surgeon: Ihor Gully, MD;   [facility];  Service: Urology;  Laterality: Right;  . PACEMAKER INSERTION  03/10/2013   MDT dual chamber PPM  . POCKET REVISION N/A 12/08/2013   Procedure: POCKET REVISION;  Surgeon: Duke Salvia, MD;  Location: [facility] CATH LAB;  Service: Cardiovascular;  Laterality: N/A;  . PORTA CATH INSERTION    . REVERSE SHOULDER ARTHROPLASTY Right 05/14/2018   Procedure: RIGHT REVERSE SHOULDER ARTHROPLASTY;  Surgeon: Jones Broom, MD;  Location: [facility] OR;  Service: Orthopedics;  Laterality: Right;  . REVERSE SHOULDER REPLACEMENT Right 05/14/2018  . RIGHT HEART CATH N/A 07/21/2019   Procedure: RIGHT HEART CATH;  Surgeon: Laurey Morale, MD;  Location: [facility] INVASIVE CV LAB;  Service: Cardiovascular;  Laterality: N/A;  . TEE WITH CARDIOVERSION  09/22/2017  . TEE WITHOUT CARDIOVERSION N/A 10/03/2017   Procedure: TRANSESOPHAGEAL ECHOCARDIOGRAM (TEE);  Surgeon: Thurmon Fair, MD;  Location: [facility] ENDOSCOPY;  Service: Cardiovascular;  Laterality: N/A;  .  TEE WITHOUT CARDIOVERSION N/A 08/04/2019   Procedure: TRANSESOPHAGEAL ECHOCARDIOGRAM (TEE);  Surgeon: Larey Dresser, MD;  Location: [facility] ENDOSCOPY;  Service: Cardiovascular;  Laterality: N/A;  . TEE WITHOUT CARDIOVERSION N/A 12/10/2019   Procedure: TRANSESOPHAGEAL ECHOCARDIOGRAM (TEE);  Surgeon: Buford Dresser, MD;  Location: [facility] ENDOSCOPY;  Service: Cardiovascular;  Laterality: N/A;  . THYROIDECTOMY  1998   Dr Margot Chimes  . TONSILLECTOMY    . TOTAL KNEE ARTHROPLASTY  04/13/2012   Procedure: TOTAL KNEE ARTHROPLASTY;  Surgeon: Rudean Haskell, MD;  Location: [place];  Service: Orthopedics;  Laterality: Right;  Marland Kitchen VIDEO BRONCHOSCOPY WITH ENDOBRONCHIAL ULTRASOUND N/A 02/07/2017   Procedure: VIDEO BRONCHOSCOPY WITH ENDOBRONCHIAL ULTRASOUND;  Surgeon: Marshell Garfinkel, MD;  Location: [place];  Service: Pulmonary;  Laterality: N/A;   Social History:  reports that she has never smoked. She has never used smokeless tobacco. She reports that she does not drink alcohol and  does not use drugs.  Family / Support Systems Other Supports: [name] (Sister), [name] (friend), Jacqlyn Larsen (Friend), Alyssa (friend) Anticipated Caregiver: Mary (sister) Ability/Limitations of Caregiver: Lives in [place]- plans to stay with patient for short period of time Caregiver Availability: 24/7  Social History Preferred language: English Religion: Non-Denominational Read: Yes Write: Yes Employment Status: Retired Public relations account executive Issues: n/a Guardian/Conservator: Norva Riffle   Abuse/Neglect Abuse/Neglect Assessment Can Be Completed: Yes Physical Abuse: Denies Verbal Abuse: Denies Sexual Abuse: Denies Exploitation of patient/patient's resources: Denies Self-Neglect: Denies  Emotional Status Pt's affect, behavior and adjustment status: n/a Recent Psychosocial Issues: n/a Psychiatric History: n/a Substance Abuse History: n/a  Patient / Family Perceptions, Expectations & Goals Pt/Family understanding of illness & functional limitations: yes per sister [name] Premorbid pt/family roles/activities: Previosuly MOD I W/ RW, driving, going to gym Anticipated changes in roles/activities/participation: MOD I/Supervision some assitance from family Pt/family expectations/goals: MOD I/Supervision  [employer]: None Premorbid Home Care/DME Agencies: Other (Comment) (Rolling walker, Cane, Marketing executive, Civil engineer, contracting) Transportation available at discharge: Friends able to [employer] referrals recommended: Neuropsychology (coping)  Discharge Planning Living Arrangements: Alone Support Systems: Other relatives (Pt sister (Mary-[place])) Type of Residence: Private residence (1 level home, level entrance) Insurance Resources: [employer] (specify) ([employer]) Museum/gallery curator Resources: Social Data processing manager Screen Referred: No Living Expenses: Own Money Management: Patient Does the patient have any problems obtaining your  medications?: No Home Management: Independent Patient/Family Preliminary Plans: Independent with assistance Care Coordinator Barriers to Discharge: Decreased caregiver support,Lack of/limited family support Care Coordinator Anticipated Follow Up Needs: HH/OP Expected length of stay: 2 weeks  Clinical Impression SW met with pt, called sister via telephone. Introduced self, explained role. Addressed questions and concerns. SW informed pt sister that [facility] is not considered a SNF and 20 days are not guaranteed, sister understood. Provided pt sister wit [facility] resources for pt at d/c. Pt plans to d/c home independent.  sister potentially will come down form [place] to stay with pt for short period of time. 1 level home, level entrance. No additional questions or concerns, sw will cont to follow up.    Dyanne Iha 12/25/2020, 11:41 AM

## 2020-12-25 NOTE — Progress Notes (Incomplete)
Resting in bed, Alert, pleasant and in good spirits, states she beginning to feel somewhat better today, slight pain to left hip surgical area with moment but tolerable.Patient refused Lidoderm patches states she is trying not to take to much pain medications because it makes her feel like she is not in control of herself. She was informed and made aware that there were other medications available if she need something for pain, states she is aware. Kineso tape placed by Therapy to left upper thigh area patient states this has helped her some much with her pain

## 2020-12-25 NOTE — Progress Notes (Signed)
Physical Therapy Session Note  Patient Details  Name: Kayla Harrison MRN: [id number] Date of Birth: [birth year]  Today's Date: 12/25/2020 PT Individual Time: 1450-1530 PT Individual Time Calculation (min): 40 min   Short Term Goals: Week 1:  PT Short Term Goal 1 (Week 1): Patient will perform bed mobility with supervision consistently. PT Short Term Goal 2 (Week 1): Patient will perform basic transfers with CGA using LRAD. PT Short Term Goal 3 (Week 1): Patient will perform standing tolerace without symptoms >4 min. PT Short Term Goal 4 (Week 1): Patient will ambulate >20 ft using LRAD with min A.  Skilled Therapeutic Interventions/Progress Updates:     Patient in recliner in the room upon PT arrival. Patient alert and agreeable to PT session. Patient reported 7/10 L hip pain during session, RN made aware and brought pain medicine at end of session. PT provided repositioning, rest breaks, and distraction as pain interventions throughout session.   Per Robina Ade, PT, patient orthostatic in standing during session this afternoon. Focused session on returning patient to bed, doffing and performing skin checks for ACE wraps, and placement of kinesiotape for L hip edema control.   Therapeutic Activity: Bed Mobility: Patient performed sit to supine with supervision with use of bed rail in a flat bed. Provided verbal cues for reducing use of bed rail to simulate home set-up. Performed rolling R/L with supervision with use of bed rail to void using bed pan. Patient with urgency incontinence during bed pan placement, otherwise continent of bladder. Required total A for peri-car and donning a new incontinence brief.  Transfers: Patient performed stand pivot recliner>bed with min A with mild posterior LOB at end of turn. Provided verbal cues for forward weight shift and boosting up to stand and shifting weight into her toes to reduce posterior bias during turn. Patient asymptomatic during transfer.  Doffed  B ace wraps, noted pitting edema without redness or skin breakdown. Pitting edema resolved after ~5 min with wraps doffed.   Applied Kinesiotape over > lateral/posterior thigh using lymphatic drainage technique with the aim of edema control. Prior to application, patient denied any history of skin irritation or allergy to adhesive. Educated patient on purpose of kinesiotape placement and signs symptoms of allergic reaction or irritation. Cleaned patient's skin and applied test strip at beginning of session and removed at end of session without sings of skin irritation or tearing. Instructed patient that the tape can be left on up to 3 days and can be worn in the shower. Instructed to removed the tape if peeling off, signs of skin irritation arise, or it has been on for >3 days. Informed patient that the tape is best removed in the shower or with a wet wash cloth. Patient stated understanding. Rehab team informed about tape placement to assist with monitoring patient's response. RN educated on tape at end of session.   Patient in bed with RN changing L hip dressing at end of session with breaks locked, bed alarm set, and all needs within reach.    Therapy Documentation Precautions:  Precautions Precautions: Fall Precaution Comments: orthostatic hypotension, B LE ACE wraps, Abdominal binder as approriate Restrictions Weight Bearing Restrictions: Yes LUE Weight Bearing: Weight bearing as tolerated   Therapy/Group: Individual Therapy  Ronel Rodeheaver L Katheryne Gorr PT, DPT  12/25/2020, 4:28 PM

## 2020-12-25 NOTE — Progress Notes (Signed)
Occupational Therapy Session Note  Patient Details  Name: Kayla Harrison MRN: [id number] Date of Birth: Nov 14, 1945  Today's Date: 12/25/2020 OT Individual Time: 1225-8346 OT Individual Time Calculation (min): 60 min    Short Term Goals: Week 1:  OT Short Term Goal 1 (Week 1): Pt will complete toilet transfer with 1 assist and LRAD OT Short Term Goal 2 (Week 1): Pt will complete LB dressing at sit<stand level to increase standing tolerance OT Short Term Goal 3 (Week 1): Pt will complete 1 grooming task while sitting at the sink to increase OOB tolerance  Skilled Therapeutic Interventions/Progress Updates:    Pt supine, finishing up BM on bedpan with NT. She reported 8/10 L hip pain, alleviated with rest but willing to participate in OT session. Extensive time discussing OH and hx of falls. Pt with BLE lymphedema making ted hose wear difficult and likely insufficient. BP supine 120/57. Once EOB BP was 115/57. Pt required min A HHA to come EOB. Pt completed oral care with set up assist sitting EOB. Pt completed UB bathing and dressing with set up assist, assist provided to wash back only. Pt donned new hospital gown for comfort. She stood from EOB with CGA using RW, requiring cueing for hand placement. Attempted to complete standing level BP reading but pt was unable to remain standing and sat- however still got BP reading half standing/sitting and it read 88/49. Pt scooted up on EOB and returned to supine with CGA, Pt was left supine with all needs met, bed alarm set.   Therapy Documentation Precautions:  Precautions Precautions: Fall Precaution Comments: orthostatic hypotension, B LE ACE wraps, Abdominal binder as approriate Restrictions Weight Bearing Restrictions: Yes LUE Weight Bearing: Weight bearing as tolerated Therapy/Group: Individual Therapy  Curtis Sites 12/25/2020, 6:19 AM

## 2020-12-25 NOTE — Progress Notes (Signed)
Physical Therapy Session Note  Patient Details  Name: Kayla Harrison MRN: [id number] Date of Birth: 1946-07-19  Today's Date: 12/25/2020 PT Individual Time: 1350-1445 PT Individual Time Calculation (min): 55 min   Short Term Goals: Week 1:  PT Short Term Goal 1 (Week 1): Patient will perform bed mobility with supervision consistently. PT Short Term Goal 2 (Week 1): Patient will perform basic transfers with CGA using LRAD. PT Short Term Goal 3 (Week 1): Patient will perform standing tolerace without symptoms >4 min. PT Short Term Goal 4 (Week 1): Patient will ambulate >20 ft using LRAD with min A.  Skilled Therapeutic Interventions/Progress Updates:     Pt received seated in recliner and agrees to therapy. Reports pain in L hip. PT provides repositioning and rest breaks as needed to manage pain. Pt blood pressure in short sitting 117/51. PT wraps bilateral lower extremities with ace wraps for lymphedema management and to assist with orthostatic hypotension. Pt then performs sit to stands with RW and minA. PT assess pt's blood pressure in standing. Vitals machine takes approximately x1 minute to register blood pressure (65/55), and pt is symptomatic. Pt takes extended seated rest break, performing alternating heel and toe raises, and PT adds additional ace wrap to each leg. Pt stands again with minA and RW. Blood pressure is not able to accurately be taken as pt becomes very symptomatic and sits down suddenly. Pt left seated in recliner with alarm intact and all needs within reach.  Therapy Documentation Precautions:  Precautions Precautions: Fall Precaution Comments: orthostatic hypotension, B LE ACE wraps, Abdominal binder as approriate Restrictions Weight Bearing Restrictions: Yes LUE Weight Bearing: Weight bearing as tolerated   Therapy/Group: Individual Therapy  Breck Coons, PT, DPT 12/25/2020, 3:45 PM

## 2020-12-25 NOTE — Progress Notes (Signed)
Meredith Staggers, MD  Physician  Physical Medicine and Rehabilitation  PMR Pre-admission     Signed  Date of Service:  12/22/2020 12:35 PM      Related encounter: ED to Hosp-Admission (Discharged) from 12/15/2020 in [place]          Show:Clear all $RemoveBefore'[x]'VqnFyGNiiTycr$ Manual$R'[x]'JC$ Templa'[x]'$ Copied  Added by: $RemoveB'[x]'QBHuHCdm$ Cristina Gong, RN$RemoveBeforeDE'[x]'KJfkWjCUXJbcJdd$ Meredith Staggers, MD   '[]'$ Hover for details  PMR Admission Coordinator Pre-Admission Assessment   Patient: Kayla Harrison is an 75 y.o., female MRN: [id number] DOB: [birth year] Height: $RemoveBeforeDE'5\' 6"'YqKTkxDfShlIaNG$  (167.6 cm) Weight: 92.5 kg   Insurance Information HMO:     PPO:      PCP:      IPA:      80/20:      OTHER:  PRIMARY: Medicare      Policy#: [id number]      Subscriber: pt Benefits:  Phone #: passport one online     Name: 4/1 Eff. Date: 05/24/2009     Deduct: $1556      Out of Pocket Max: none      Life Max: none CIR: 100%      SNF: 20 full days Outpatient: 80%     Co-Pay: 20% Home Health: 100%      Co-Pay: none DME: 80%     Co-Pay: 20% Providers: pt choice  SECONDARY: [employer]      Policy#: [id number]   Financial Counselor:       Phone#:    The "Data Collection Information Summary" for patients in Inpatient Rehabilitation Facilities with attached "Privacy Act [place] Records" was provided and verbally reviewed with: Patient   Emergency Contact Information         Contact Information     Name Relation Home Work Mobile    [facility] Sister     [phone]    Smith,[name] [place], [place]     [phone]    Geanie Berlin     [phone]         Current Medical History  Patient Admitting Diagnosis: Debility   History of Present Illness:  75 year old female with history of Lung cancer, NHL w/ SVC syndrome, lap band surgery, A fib--on Eliquis, chronic LBP, CVA with expressive deficits (CIR  who was admitted on 12/15/20 after a fall with  reports of HA, acute on chronic neck and back pain as well as increase in weakness. She was found to have non-displaced left coronoid process fracture with question of lucency over lateral aspect of radial head and large SQ hematoma left hip with moderate OA. Ortho evaluated patient and recommended WBAT left elbow and questioned need of I & D of left hip hematoma. Right rib pain treated with lidocaine patch.  Eliquis held and she was placed on heparin bridge.    She continued to have severe hip pain with significant edema and concerns of skin necrosis. MRI left hip showed large hematoma left hip 10 cm in size, partial thickness tear of anterosuperior labrum and low grade partial tears of bilateral hamstring tendon origin. She was taken to OR on 03/29 for I & D of latera hip hematoma by Dr. Lucia Gaskins. To receive 1 unit PRBCs today for Hgb 7.2.    Dr. Caryl Comes consulted for input on h/o recurrent falls and profound orthostatic hypotension with near syncope with activity. Florinef added and titrated upwards.  Post op wound VAC removed 03/31 and wound closed. She is WBAT and showing improvement in activity tolerance but continues to be limited by weakness with mild dizziness and HA.    Patient's medical record from [facility] has been reviewed by the rehabilitation admission coordinator and physician.   Past Medical History      Past Medical History:  Diagnosis Date  . Anemia    . Arthritis      osteoarthritis - knees and right shoulder  . Blood transfusion without reported diagnosis    . Breast cancer ([place])      Dr Margot Chimes, total thyroidectomy- 1999- for cancer  . 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 Chronic bilateral pleural effusions    . Colon polyp      Dr Earlean Shawl  . Complication of anesthesia      Ketamine produces LSD reaction, bright colored nightmarish experience   . Dyslipidemia    . Endometriosis    . Fibroids    . H/O pleural effusion      s/p thoracentesis w 3229ml withdrawn  . Hepatitis       [place] ?hepatitis   . History of dysphagia      due to radiation therapy  . History of hiatal hernia      small noted on PET scan  . Hypertension    . Hypothyroidism    . Lung cancer, lower lobe ([place]) 01/2017    radiation RX completed 03/04/17; will start chemo 6/27, pt unaware of lung cancer  . Morbid obesity ([place])      Status post lap band surgery  . Nephrolithiasis    . Non Hodgkin's lymphoma ([place])      on chemotherapy  . Persistent atrial fibrillation ([place])      a. s/p PVI 2008 b. s/p convergent ablation 5366 complicated by bradycardia requiring pacemaker implant  . Personal history of radiation therapy    . Presence of permanent cardiac pacemaker    . Rotator cuff tear      Right  . Stroke [facility])      2003- [place] x2  . SVC syndrome      with lung mass and non hodgkins lymphoma  . Thyroid cancer ([place]) 2000      Family History   family history includes Colon cancer in her father; Diabetes in her brother, paternal aunt, paternal grandmother, and sister; Heart attack in her father; Heart disease (age of onset: 8) in her father; Other in her mother.   Prior Rehab/Hospitalizations Has the patient had prior rehab or hospitalizations prior to admission? Yes   Has the patient had major surgery during 100 days prior to admission? Yes              Current Medications   Current Facility-Administered Medications:  .  0.9 %  sodium chloride infusion (Manually program via Guardrails IV Fluids), , Intravenous, Once, Pokhrel, Laxman, MD .  0.9 %  sodium chloride infusion, , Intravenous, PRN, Pokhrel, Laxman, MD, Last Rate: 10 mL/hr at 12/20/20 1720, Infusion Verify at 12/20/20 1720 .  acetaminophen (TYLENOL) tablet 650 mg, 650 mg, Oral, Q6H PRN, 650 mg at 12/18/20 1810 **OR** acetaminophen (TYLENOL) suppository 650 mg, 650 mg, Rectal, Q6H PRN, Erle Crocker, MD .  amiodarone (PACERONE) tablet 200 mg, 200 mg, Oral, Daily, Erle Crocker,  MD, 200 mg at 12/21/20 1045 .  bisacodyl (DULCOLAX) EC tablet 5 mg, 5 mg, Oral, Daily PRN, Erle Crocker,  MD, 5 mg at 12/20/20 2229 .  docusate sodium (COLACE) capsule 100 mg, 100 mg, Oral, BID, Erle Crocker, MD, 100 mg at 12/21/20 1045 .  fentaNYL (SUBLIMAZE) injection 25-50 mcg, 25-50 mcg, Intravenous, Q2H PRN, Opyd, Ilene Qua, MD, 50 mcg at 12/21/20 2203 .  fludrocortisone (FLORINEF) tablet 0.2 mg, 0.2 mg, Oral, BID, Deboraha Sprang, MD .  heparin ADULT infusion 100 units/mL (25000 units/26mL), 1,150 Units/hr, Intravenous, Continuous, Rolla Flatten, [facility], Last Rate: 11.5 mL/hr at 12/22/20 0733, 1,150 Units/hr at 12/22/20 0733 .  HYDROcodone-acetaminophen (NORCO/VICODIN) 5-325 MG per tablet 1 tablet, 1 tablet, Oral, Q4H PRN, Erle Crocker, MD, 1 tablet at 12/22/20 [phone] .  levothyroxine (SYNTHROID) tablet 137 mcg, 137 mcg, Oral, Once per day on Sun Mon Tue Wed Thu Fri, Adair, Christopher R, MD, 137 mcg at 12/22/20 [phone] .  [START ON 12/23/2020] levothyroxine (SYNTHROID) tablet 68.5 mcg, 68.5 mcg, Oral, Once per day on Sat, Erle Crocker, MD .  lidocaine (LIDODERM) 5 % 2 patch, 2 patch, Transdermal, Q24H, Erle Crocker, MD, 2 patch at 12/20/20 2222 .  magnesium citrate solution 1 Bottle, 1 Bottle, Oral, Once, Pokhrel, Laxman, MD .  multivitamin with minerals tablet 1 tablet, 1 tablet, Oral, Daily, Erle Crocker, MD, 1 tablet at 12/22/20 [phone] .  mupirocin ointment (BACTROBAN) 2 % 1 application, 1 application, Nasal, BID, Erle Crocker, MD, 1 application at 34/74/25 2127 .  ondansetron (ZOFRAN) tablet 4 mg, 4 mg, Oral, Q6H PRN **OR** ondansetron (ZOFRAN) injection 4 mg, 4 mg, Intravenous, Q6H PRN, Erle Crocker, MD .  polyethylene glycol (MIRALAX / GLYCOLAX) packet 17 g, 17 g, Oral, Daily PRN, Erle Crocker, MD .  rosuvastatin (CRESTOR) tablet 10 mg, 10 mg, Oral, Once per day on Mon Thu, Adair, Christopher R, MD, 10 mg at 12/21/20  1045 .  senna (SENOKOT) tablet 8.6 mg, 1 tablet, Oral, PRN, Erle Crocker, MD, 8.6 mg at 12/19/20 2147   Patients Current Diet:     Diet Order                      Diet regular Room service appropriate? Yes; Fluid consistency: Thin  Diet effective now                      Precautions / Restrictions Precautions Precautions: Fall Restrictions Weight Bearing Restrictions: Yes LUE Weight Bearing: Weight bearing as tolerated Other Position/Activity Restrictions: assume NWB given splint.  Sling for comfort and mobility.    Has the patient had 2 or more falls or a fall with injury in the past year? Yes Frequent falls due to ortho stasis   Prior Activity Level Community (5-7x/wk): Mod I with RW; drives; goes to gym   Prior Functional Level Self Care: Did the patient need help bathing, dressing, using the toilet or eating? Independent   Indoor Mobility: Did the patient need assistance with walking from room to room (with or without device)? Independent   Stairs: Did the patient need assistance with internal or external stairs (with or without device)? Independent   Functional Cognition: Did the patient need help planning regular tasks such as shopping or remembering to take medications? [place] / [place] Devices/Equipment: Cane (specify quad or straight),Eyeglasses,Walker (specify type) Home Equipment: Walker - 4 wheels,Shower seat - built in   Prior Device Use: Indicate devices/aids used by the patient prior to current illness, exacerbation or injury?  Walker   Current Functional Level Cognition   Arousal/Alertness: Awake/alert Overall Cognitive Status: Within Functional Limits for tasks assessed Orientation Level: Oriented X4 Attention: Sustained Sustained Attention: Appears intact Memory: Impaired Memory Impairment: Retrieval deficit Awareness: Appears intact Problem Solving: Appears intact Safety/Judgment: Appears  intact    Extremity Assessment (includes Sensation/Coordination)   Upper Extremity Assessment: LUE deficits/detail,RUE deficits/detail RUE Deficits / Details: Generalized weakness. Pt reports 2 year h/o intermittent fine motor coordination impacting writing and grasping small items. LUE Deficits / Details: able to use functionally with rw during SPT LUE: Unable to fully assess due to immobilization  Lower Extremity Assessment: Defer to PT evaluation     ADLs   Overall ADL's : Needs assistance/impaired Eating/Feeding: Set up,Sitting Grooming: Wash/dry hands,Wash/dry face,Oral care,Set up,Sitting Upper Body Dressing : Moderate assistance,Sitting Lower Body Dressing: Moderate assistance,Sit to/from stand Toilet Transfer: Minimal Furniture conservator/restorer Details (indicate cue type and reason): recliner to EOB on her right Functional mobility during ADLs: Minimal assistance General ADL Comments: Pt completed SPT then immediately back to bed d/t near syncope during transfers     Mobility   Overal bed mobility: Needs Assistance Bed Mobility: Supine to Sit Supine to sit: Min assist Sit to supine: Mod assist General bed mobility comments: Min assistance for trunk elevation and use of bed pad to advance hips to edge of bed.  Denies dizziness sitting edge of bed.     Transfers   Overall transfer level: Needs assistance Equipment used: Rolling walker (2 wheeled) Transfers: Sit to/from Stand Sit to Stand: Min assist,From elevated surface Stand pivot transfers: Min assist General transfer comment: Cues for hand placement performed sit to stand x 2 reps.     Ambulation / Gait / Stairs / Wheelchair Mobility   Ambulation/Gait Ambulation/Gait assistance: Min assist,+2 safety/equipment (for close chair follow.) Gait Distance (Feet): 15 Feet (+ 20 ft.) Assistive device: Rolling walker (2 wheeled) Gait Pattern/deviations: Step-to pattern,Trunk flexed,Decreased stride length General  Gait Details: Pt able to progress gt distance with mild dizziness.  Close chair follow used this session but no syncopal events. Gait velocity: decreased     Posture / Balance Balance Overall balance assessment: Needs assistance Sitting-balance support: No upper extremity supported,Feet supported Sitting balance-Leahy Scale: Good Standing balance support: Bilateral upper extremity supported,During functional activity Standing balance-Leahy Scale: Poor Standing balance comment: patient reliant on at least single UE support High Level Balance Comments: patient losing balance bacwards with sit to stand.     Special needs/care consideration Ortho stasis at baseline chronic    Previous Home Environment  Living Arrangements: Alone  Lives With: Alone Available Help at Discharge: Friend(s),Available PRN/intermittently Type of Home: House Home Layout: One level Home Access: Level entry Bathroom Shower/Tub: Multimedia programmer: Handicapped height Bathroom Accessibility: Yes How Accessible: Accessible via walker [place]: No   Discharge Living Setting Plans for Discharge Living Setting: Patient's home,Alone Type of Home at Discharge: House Discharge Home Layout: One level Discharge Home Access: Level entry Discharge Bathroom Shower/Tub: Walk-in shower Discharge Bathroom Toilet: Handicapped height Discharge Bathroom Accessibility: Yes How Accessible: Accessible via walker Does the patient have any problems obtaining your medications?: No   Social/Family/Support Systems Contact Information: sisters Anticipated Caregiver: sisters. Local sister and Raymon Mutton, from [place] to come short term Anticipated Caregiver's Contact Information: see above Caregiver Availability: Intermittent Discharge Plan Discussed with Primary Caregiver: Yes Is Caregiver In Agreement with Plan?: Yes Does Caregiver/Family have Issues with Lodging/Transportation while Pt is in Rehab?: No  Goals Patient/Family Goal for Rehab: Mod I to supervision wiht PT and OT Expected length of stay: ELOS 2 weeks Pt/Family Agrees to Admission and willing to participate: Yes Program Orientation Provided & Reviewed with Pt/Caregiver Including Roles  & Responsibilities: Yes   Decrease burden of Care through IP rehab admission: n/a   Possible need for SNF placement upon discharge: not anticipated   Patient Condition: I have reviewed medical records from [facility]  spoken with  patient and family member. I met with patient at the bedside for inpatient rehabilitation assessment.  Patient will benefit from ongoing PT and OT, can actively participate in 3 hours of therapy a day 5 days of the week, and can make measurable gains during the admission.  Patient will also benefit from the coordinated team approach during an Inpatient Acute Rehabilitation admission.  The patient will receive intensive therapy as well as Rehabilitation physician, nursing, social worker, and care management interventions.  Due to bladder management, bowel management, safety, skin/wound care, disease management, medication administration, pain management and patient education the patient requires 24 hour a day rehabilitation nursing.  The patient is currently mod assist overall with mobility and basic ADLs.  Discharge setting and therapy post discharge at home with home health is anticipated.  Patient has agreed to participate in the Acute Inpatient Rehabilitation Program and will admit today.   Preadmission Screen Completed By:  Cleatrice Burke, 12/22/2020 12:35 PM ______________________________________________________________________   Discussed status with Dr. Naaman Plummer  on  12/22/2020 at  1242 and received approval for admission today.   Admission Coordinator:  Cleatrice Burke, RN, time  2951 Date  12/22/2020    Assessment/Plan: Diagnosis: left elbow fx, left hip hematoma after fall with associated pain and  gait deficits 1. Does the need for close, 24 hr/day Medical supervision in concert with the patient's rehab needs make it unreasonable for this patient to be served in a less intensive setting? Yes 2. Co-Morbidities requiring supervision/potential complications: lung ca, NHL, ABLA, afib 3. Due to bladder management, bowel management, safety, skin/wound care, disease management, medication administration, pain management and patient education, does the patient require 24 hr/day rehab nursing? Yes 4. Does the patient require coordinated care of a physician, rehab nurse, PT, OT, and SLP to address physical and functional deficits in the context of the above medical diagnosis(es)? Yes Addressing deficits in the following areas: balance, endurance, locomotion, strength, transferring, bowel/bladder control, bathing, dressing, feeding, grooming, toileting and psychosocial support 5. Can the patient actively participate in an intensive therapy program of at least 3 hrs of therapy 5 days a week? Yes 6. The potential for patient to make measurable gains while on inpatient rehab is excellent 7. Anticipated functional outcomes upon discharge from inpatient rehab: modified independent to supervision PT, modified independent and supervision OT, n/a SLP 8. Estimated rehab length of stay to reach the above functional goals is: 14 days 9. Anticipated discharge destination: Home 10. Overall Rehab/Functional Prognosis: excellent     MD Signature: Meredith Staggers, MD, [place] 12/22/2020           Revision History                             Note Details  Author Meredith Staggers, MD File Time 12/22/2020 12:59 PM  Author Type Physician Status Signed  Last Editor Meredith Staggers, MD Service Physical Medicine and [facility]  Acct # [id number] Admit Date 12/22/2020

## 2020-12-25 NOTE — Progress Notes (Addendum)
Electrophysiology Rounding Note  Patient Name: Kayla Harrison Date of Encounter: 12/25/2020  Electrophysiologist: [name], MD    Subjective   The patient is doing well today. Refused blood work. Asking about PICC. States her hip feels more firm today.   Inpatient Medications    Scheduled Meds: . amiodarone  200 mg Oral Daily  . apixaban  5 mg Oral BID  . fludrocortisone  0.2 mg Oral BID WC  . levothyroxine  137 mcg Oral Once per day on Sun Mon Tue Wed Thu Fri  . levothyroxine  68.5 mcg Oral Once per day on Sat  . lidocaine  2 patch Transdermal Q24H  . multivitamin with minerals  1 tablet Oral Daily  . rosuvastatin  10 mg Oral Once per day on Mon Thu  . senna  2 tablet Oral BID   Continuous Infusions:  PRN Meds: acetaminophen, alum & mag hydroxide-simeth, bisacodyl, diphenhydrAMINE, guaiFENesin-dextromethorphan, oxyCODONE, polyethylene glycol, prochlorperazine **OR** prochlorperazine **OR** prochlorperazine, sodium phosphate, traZODone   Vital Signs    Vitals:   12/24/20 1437 12/24/20 1929 12/25/20 0520 12/25/20 0642  BP: 134/65 (!) 108/54 120/61 122/62  Pulse: 69 70 70 70  Resp: 17 16 16 17   Temp: 98.3 F (36.8 C) 98.4 F (36.9 C) 98.2 F (36.8 C) 97.9 F (36.6 C)  TempSrc: Oral     SpO2: 98% 97% 100% 100%    Intake/Output Summary (Last 24 hours) at 12/25/2020 0930 Last data filed at 12/25/2020 0743 Gross per 24 hour  Intake 360 ml  Output --  Net 360 ml   There were no vitals filed for this visit.  Physical Exam    GEN- The patient is well appearing, alert and oriented x 3 today.   Head- normocephalic, atraumatic Eyes-  Sclera clear, conjunctiva pink Ears- hearing intact Oropharynx- clear Neck- supple Lungs- Clear to ausculation bilaterally, normal work of breathing Heart- Regular rate and rhythm, no murmurs, rubs or gallops GI- soft, NT, ND, + BS Extremities- no clubbing or cyanosis. No edema Skin- no rash or lesion. Large left hip/thigh  hematoma.  Psych- flat but appropriate affect. Neuro- strength and sensation are intact  Labs    CBC Recent Labs    12/23/20 0510 12/24/20 0705  WBC 12.6* 10.9*  NEUTROABS 9.8* 8.5*  HGB 7.1* 8.0*  HCT 22.1* 24.3*  MCV 92.1 91.0  PLT 205 979   Basic Metabolic Panel Recent Labs    12/23/20 0510 12/24/20 0705  NA 136 138  K 4.6 5.1  CL 105 104  CO2 23 26  GLUCOSE 124* 111*  BUN 26* 22  CREATININE 0.92 0.88  CALCIUM 8.1* 8.1*   Liver Function Tests Recent Labs    12/23/20 0510  AST 34  ALT 16  ALKPHOS 56  BILITOT 1.1  PROT 4.5*  ALBUMIN 2.0*   No results for input(s): LIPASE, AMYLASE in the last 72 hours. Cardiac Enzymes No results for input(s): CKTOTAL, CKMB, CKMBINDEX, TROPONINI in the last 72 hours.   Telemetry    Not currently connected (personally reviewed)  Radiology    No results found.  Patient Profile     Admitted with a fall fracture of her elbow, ribs.  Also bruised hip Orthostatic vital signs showed profound decrease in blood pressure 110--80 going from lying to sitting.   Assessment & Plan    Atrial fibrillation-persistent  Amiodarone therapy-longstanding  Sinus node dysfunction status post pacemaker  Orthostatic hypotension-profound  Falls-recurrent  Anemia   Cr 0.88 4/3 K  5.1.  Hgb 8.0 4/3 s/p transfusion. Refusing labs this am. She requests PICC line due to continued difficulties with blood draws and frequent blood draws, but per CIR MD, pt would like to avoid if possible.  Perhaps if we can limit labwork she will tolerate blood draws. Will defer for today.   Continue florinef. Orthostatic VS pending  OOB to chair as tolerated to avoid bed rest aggravation of orthostatic hyptension    For questions or updates, please contact [place] Please consult [email or web address] for contact info under Cardiology/STEMI.  Signed, Shirley Friar, PA-C  12/25/2020, 9:30 AM

## 2020-12-25 NOTE — IPOC Note (Signed)
Overall Plan of Care [facility]) Patient Details Name: Kayla Harrison MRN: [id number] DOB: 12-16-1945  Admitting Diagnosis: Physical debility  Hospital Problems: Principal Problem:   Physical debility Active Problems:   Constipation   Orthostatic hypotension   Tear of left hamstring   Labral tear of left hip joint   Acute blood loss anemia     Functional Problem List: Nursing Bladder,Bowel,Endurance,Medication Management,Pain,Safety,Skin Integrity,Motor  PT Balance,Sensory,Skin Integrity,Edema,Endurance,Motor,Nutrition,Pain,Safety  OT Balance,Edema,Endurance,Motor,Pain,Safety,Sensory,Skin Integrity  SLP    TR         Basic ADL's: OT Grooming,Bathing,Dressing,Toileting     Advanced  ADL's: OT Simple Meal Preparation     Transfers: PT Bed Mobility,Bed to [employer]  OT Toilet     Locomotion: PT Ambulation,Wheelchair Mobility,Stairs     Additional Impairments: OT Fuctional Use of Upper Extremity (pt reports her handwriting is illegible)  SLP        TR      Anticipated Outcomes Item Anticipated Outcome  Self Feeding No goal  Swallowing      Basic self-care  Supervision-Min A  Toileting  Supervision   Bathroom Transfers Supervision  Bowel/Bladder  manage bowel with min assist  Transfers  Mod I using LRAD  Locomotion  Supervision using LRAD 50 ft.  Communication     Cognition     Pain  Pain level less than 4 on scale of 0-10  Safety/Judgment  Remain free of injury, prevent falls with cues and reminders   Therapy Plan: PT Intensity: Minimum of 1-2 x/day ,45 to 90 minutes PT Frequency: 5 out of 7 days PT Duration Estimated Length of Stay: 2 weeks OT Intensity: Minimum of 1-2 x/day, 45 to 90 minutes OT Frequency: 5 out of 7 days OT Duration/Estimated Length of Stay: 12-14 days     Due to the current state of emergency, patients may not be receiving their 3-hours of Medicare-mandated therapy.   Team Interventions: Nursing Interventions  Patient/Family Education,Bladder Management,Bowel Management,Pain Management,Medication Management,Skin Care/Wound [employer] Planning  PT interventions Ambulation/gait training,Community Corporate treasurer re-education,Psychosocial support,Stair training,UE/LE Strength taining/ROM,Wheelchair propulsion/positioning,UE/LE Coordination activities,Therapeutic Activities,Skin care/wound Medical laboratory scientific officer stimulation,Pain management,Discharge planning,Balance/vestibular training,Disease management/prevention,Functional mobility training,Patient/family education,Splinting/orthotics,Therapeutic Exercise  OT Interventions Balance/vestibular training,DME/adaptive equipment instruction,Patient/family education,Therapeutic Activities,Psychosocial support,Therapeutic Exercise,UE/LE Strength taining/ROM,Self Care/advanced ADL retraining,Functional mobility training,Community reintegration,Discharge planning,Skin care/wound managment,UE/LE Coordination activities,Pain management,Disease mangement/prevention  SLP Interventions    TR Interventions    SW/CM Interventions     Barriers to Discharge MD  Medical stability  Nursing      PT Decreased caregiver support,Lack of/limited family support,Home environment access/layout,Weight,Other (comments) orthostatic hypotension with hx of at least 5 syncopal episodes with falls in the past year per patient report  OT Decreased caregiver support,Lack of/limited family support,Weight,Incontinence,Wound Care    SLP      SW       Team Discharge Planning: Destination: PT-Home ,OT- Home , SLP-  Projected Follow-up: PT-Home health PT, OT-  Home health OT, SLP-  Projected Equipment Needs: PT-To be determined, OT- To be determined, SLP-  Equipment Details: PT-patient has RW, OT-  Patient/family involved in discharge planning: PT- Patient,  OT-Patient, SLP-   MD ELOS: 2 weeks Medical  Rehab Prognosis:  Excellent Assessment: Kayla Harrison is a 75 year old woman who is admitted to CIR with physical debility following a left coronoid process fracture and left hip labral and hamstring tendon tears with associated large hematoma which required I&D and vac. Active medical issues include post-operative pan, sutures, hematoma of arm and leg, anemia but difficulty with blood  draws due to hematoma, severe hypotension   See Team Conference Notes for weekly updates to the plan of care

## 2020-12-25 NOTE — Progress Notes (Signed)
Inpatient [facility] Individual Statement of Services  Patient Name:  Kayla Harrison  Date:  12/25/2020  Welcome to the [facility].  Our goal is to provide you with an individualized program based on your diagnosis and situation, designed to meet your specific needs.  With this comprehensive rehabilitation program, you will be expected to participate in at least 3 hours of rehabilitation therapies Monday-Friday, with modified therapy programming on the weekends.  Your rehabilitation program will include the following services:  Physical Therapy (PT), Occupational Therapy (OT), Speech Therapy (ST), 24 hour per day rehabilitation nursing, Therapeutic Recreaction (TR), Neuropsychology, Care Coordinator, Rehabilitation Medicine, Nutrition Services, Pharmacy Services and Other  Weekly team conferences will be held on Tuesdays to discuss your progress.  Your Inpatient Rehabilitation Care Coordinator will talk with you frequently to get your input and to update you on team discussions.  Team conferences with you and your family in attendance may also be held.  Expected length of stay: 2 Weeks   Overall anticipated outcome: MOD I/Supervision  Depending on your progress and recovery, your program may change. Your Inpatient Rehabilitation Care Coordinator will coordinate services and will keep you informed of any changes. Your Inpatient Rehabilitation Care Coordinator's name and contact numbers are listed  below.  The following services may also be recommended but are not provided by the [facility]:    [facility] will be made to provide these services after discharge if needed.  Arrangements include referral to agencies that provide these services.  Your insurance has been verified to be:  Medicare Your primary doctor is:  [name], MD  Pertinent information  will be shared with your doctor and your insurance company.  Inpatient Rehabilitation Care Coordinator:  Erlene Quan, [place] or [phone]  Information discussed with and copy given to patient by: Dyanne Iha, 12/25/2020, 11:39 AM

## 2020-12-25 NOTE — Progress Notes (Signed)
Patient refused lab draw this morning, will inform oncoming nurse

## 2020-12-25 NOTE — Progress Notes (Signed)
Occupational Therapy Session Note  Patient Details  Name: Kayla Harrison MRN: [id number] Date of Birth: 01-08-1946  Today's Date: 12/25/2020 OT Individual Time: 1134-1200 OT Individual Time Calculation (min): 26 min    Short Term Goals: Week 1:  OT Short Term Goal 1 (Week 1): Pt will complete toilet transfer with 1 assist and LRAD OT Short Term Goal 2 (Week 1): Pt will complete LB dressing at sit<stand level to increase standing tolerance OT Short Term Goal 3 (Week 1): Pt will complete 1 grooming task while sitting at the sink to increase OOB tolerance  Skilled Therapeutic Interventions/Progress Updates:    Pt greeted semi-reclined in bed and agreeable to OT treatment session. Pt reported need to urinate. Pt completed bed mobility with min A to elevate trunk. Sit<>stand w/ RW and min A, then min A to pivot to BSC. Pt needed OT assist to doff brief and voided bladder. Worked on sit<>stand and standing endurance to wash peri-area. Pt maintained standing for OT to fasten new brief, then pt ambulated 4 feet over to recliner with RW and min A. Pt left seated in recliner with chair alarm pad on, call bell in reach, and needs met.   Therapy Documentation Precautions:  Precautions Precautions: Fall Precaution Comments: orthostatic hypotension, B LE ACE wraps, Abdominal binder as approriate Restrictions Weight Bearing Restrictions: Yes LUE Weight Bearing: Weight bearing as tolerated Pain: Pain Assessment Pain Scale: 0-10 Pain Score: 0-No pain   Therapy/Group: Individual Therapy  Valma Cava 12/25/2020, 12:41 PM

## 2020-12-25 NOTE — Progress Notes (Signed)
Inpatient Rehabilitation  Patient information reviewed and entered into eRehab system by Alayna Mabe M. Bohdi Leeds, M.A., CCC/SLP, PPS Coordinator.  Information including medical coding, functional ability and quality indicators will be reviewed and updated through discharge.    

## 2020-12-25 NOTE — Progress Notes (Signed)
PROGRESS NOTE   Subjective/Complaints: Kayla Harrison complains of pain in her left hip. It is bearable when she is resting and worse with movement. She is trying to minimize use of pain medications as they affect her cognition.   ROS:  Pt denies SOB, abd pain, CP, N/V/C/D, and vision changes, + pain in left hip.  Objective:   No results found. Recent Labs    12/23/20 0510 12/24/20 0705  WBC 12.6* 10.9*  HGB 7.1* 8.0*  HCT 22.1* 24.3*  PLT 205 211   Recent Labs    12/23/20 0510 12/24/20 0705  NA 136 138  K 4.6 5.1  CL 105 104  CO2 23 26  GLUCOSE 124* 111*  BUN 26* 22  CREATININE 0.92 0.88  CALCIUM 8.1* 8.1*    Intake/Output Summary (Last 24 hours) at 12/25/2020 1050 Last data filed at 12/25/2020 0743 Gross per 24 hour  Intake 360 ml  Output --  Net 360 ml        Physical Exam: Vital Signs Blood pressure 136/65, pulse 70, temperature 98.1 F (36.7 C), resp. rate 17, SpO2 97 %. Gen: no distress, normal appearing HEENT: oral mucosa pink and moist, NCAT Cardio: Reg rate Chest: normal effort, normal rate of breathing Abd: soft, non-distended Ext: no edema Psych: pleasant, normal affect Musculoskeletal:  General: Swellingand deformitypresent.  Cervical back: Normal range of motion.  Right lower leg: Edemapresent.  Left lower leg: Edemapresent.  Skin: Findings: Bruisingand erythemapresent. Also has a large purple hematoma on R elbow and L hip very bruised/purple/swollen.  The swelling of her hematoma on her right elbow has worsened. Resorbing her left hip incision the swelling is better she does have a long thin blister lateral to the incision the erythema is about 50% improved-from the picture below.   MS:  Cranial nerve exam unremarkable. RUE 5/5 prox to distal. LUE 4-/5 with limitations at elbow d/t pain. LUE 2/5 prox to 4/5 distally d/t pain. RLE 3+ prox to 4+/5 distally.  No focal sensory findings.    Assessment/Plan: 1. Functional deficits which require 3+ hours per day of interdisciplinary therapy in a comprehensive inpatient rehab setting.  Physiatrist is providing close team supervision and 24 hour management of active medical problems listed below.  Physiatrist and rehab team continue to assess barriers to discharge/monitor patient progress toward functional and medical goals  Care Tool:  Bathing    Body parts bathed by patient: Right arm,Left arm,Chest,Abdomen,Right upper leg,Left upper leg,Face   Body parts bathed by helper: Front perineal area,Buttocks,Right lower leg,Left lower leg     Bathing assist Assist Level: 2 Helpers     Upper Body Dressing/Undressing Upper body dressing   What is the patient wearing?: Dress    Upper body assist Assist Level: Moderate Assistance - Patient 50 - 74%    Lower Body Dressing/Undressing Lower body dressing      What is the patient wearing?: Incontinence brief     Lower body assist Assist for lower body dressing: Dependent - Patient 0%     Toileting Toileting    Toileting assist Assist for toileting: Dependent - Patient 0%     Transfers Chair/bed transfer  Transfers  assist  Chair/bed transfer activity did not occur: Safety/medical concerns (orthostatic on eval)        Locomotion Ambulation   Ambulation assist   Ambulation activity did not occur: Safety/medical concerns (orthostatic on eval)          Walk 10 feet activity   Assist  Walk 10 feet activity did not occur: Safety/medical concerns        Walk 50 feet activity   Assist Walk 50 feet with 2 turns activity did not occur: Safety/medical concerns         Walk 150 feet activity   Assist Walk 150 feet activity did not occur: Safety/medical concerns         Walk 10 feet on uneven surface  activity   Assist Walk 10 feet on uneven surfaces activity did not occur: Safety/medical concerns          Wheelchair     Assist Will patient use wheelchair at discharge?: No (Per PT long term goals)   Wheelchair activity did not occur: Safety/medical concerns (orthostatic on eval)         Wheelchair 50 feet with 2 turns activity    Assist    Wheelchair 50 feet with 2 turns activity did not occur: Safety/medical concerns       Wheelchair 150 feet activity     Assist  Wheelchair 150 feet activity did not occur: Safety/medical concerns       Blood pressure 136/65, pulse 70, temperature 98.1 F (36.7 C), resp. rate 17, SpO2 97 %.  Medical Problem List and Plan: 1.Functional deficitssecondary to left coronoid process fracture and left hip labral and hamstring tendon tears with associated large hematoma which required I&D, vac -patient maynot yetshower- due to orthostatic hypotension  Continue CIR -ELOS/Goals: 14 days, mod I to supervision with PT and OT 2. Antithrombotics: -DVT/anticoagulation:Pharmaceutical:Heparin-->question transition to Eliquis -antiplatelet therapy: N/A 3. Pain Management:Hydrocodone prn  4/4: schedule Tylenol 650mg  TID.  4. Mood:LCSW to follow for evaluation and support. -antipsychotic agents:  5. Neuropsych: This patientiscapable of making decisions on herown behalf. 6. Skin/Wound Care:Will monitor wound for healing. Add protein supplement to promote wound healing. -VAC removed from wound yesterday and wound closed by ortho -continue local dressing -ACE wrap removed as it creating a tourniquet effect on thigh, skin almost broken down in fact  4/4- also has severe R elbow hematoma- agreeable to PICC if she requires further blood draws. Advised that reason for blood draws is to monitor Hgb.  7. Fluids/Electrolytes/Nutrition:Monitor I/O. Lytes have been stable, repeat PRN.  8. CAF: Monitor HR tid--continue  amiodarone 9. Hypothyroid: On supplement 10. Orthostatic hypotension: Will continue to monitor orthostatic vitals  --Florinef increased to 0.2 mg bid on 04/01 -blood transfusion today, may need more than one unit -difficult pt to apply TEDS/abd binder to given wounds and body habitus. -encourage PO intake  4/2- per note today Florinef increased, but was yesterday- will monitor- hopefully will improve with more pRBCs today.  I got 2 of PT today-  4/4: discussed with cards PA patient wants PICC if future blood draws required.  11. H/o CVA with residual mild aphasia: On Crestor and Eliquis 12. OIC: Has not had BM since admission --augment bowel regimen  4/2- will give Mg citrate- had at least 2 BMs today- will monitor- also was manually disimimpacted- will also increase bowel meds  4/3- had many BMs- feeling much better- con't bowel meds 13. Acute blood loss anemia: H/H trending down from 10.7-->7.2. --Recheck CBC  in am.  --Transfused with one unit on 04/01, may need an additional unitespecially if she's a little dry  4/2- Hb stable at 7.2- will give 1 more unit of blood.   4/3-hemoglobin is up to 8.0-continue to monitor 14. Pre-renal azotemia: BUN up to 25-->push pofluid intake  4/2- same- will push fluids  4/3- BUN down to 22- con't to push fluids PO 15. Leucocytosis: WBC has varied since surgery. Will monitor for fevers and other signs of infection.  --recheck white count in am.   4/2- stable- will monitor for infection.   4/3- down to 10k- con't to monitor     LOS: 3 days A FACE TO FACE EVALUATION WAS PERFORMED  Timon Geissinger P [place] 12/25/2020, 10:50 AM

## 2020-12-26 ENCOUNTER — Inpatient Hospital Stay (HOSPITAL_COMMUNITY): Payer: Medicare Other

## 2020-12-26 ENCOUNTER — Inpatient Hospital Stay: Payer: Self-pay

## 2020-12-26 MED ORDER — SODIUM CHLORIDE 1 G PO TABS
0.5000 g | ORAL_TABLET | Freq: Two times a day (BID) | ORAL | Status: DC
  Administered 2020-12-26 – 2021-01-03 (×17): 0.5 g via ORAL
  Filled 2020-12-26 (×21): qty 0.5

## 2020-12-26 MED ORDER — SODIUM CHLORIDE 0.9% FLUSH: 10.0000 mL | INTRAVENOUS | Status: DC | PRN

## 2020-12-26 MED ORDER — SODIUM CHLORIDE 0.9% FLUSH: 10.0000 mL | Freq: Two times a day (BID) | INTRAVENOUS | Status: DC

## 2020-12-26 NOTE — Progress Notes (Signed)
Occupational Therapy Session Note  Patient Details  Name: Kayla Harrison MRN: [id number] Date of Birth: 1946/07/13  Today's Date: 12/26/2020 OT Individual Time: 6063-0160 OT Individual Time Calculation (min): 56 min    Short Term Goals: Week 1:  OT Short Term Goal 1 (Week 1): Pt will complete toilet transfer with 1 assist and LRAD OT Short Term Goal 2 (Week 1): Pt will complete LB dressing at sit<stand level to increase standing tolerance OT Short Term Goal 3 (Week 1): Pt will complete 1 grooming task while sitting at the sink to increase OOB tolerance   Skilled Therapeutic Interventions/Progress Updates:    Pt greeted at time of session semireclined in bed resting but agreeable to OT session. Wanting to use toilet but BSC too small and bathroom toilet too low. Retrieved bariatric BSC and placed in room, did not fit over toilet d/t tight fit. Set up BSC by bed, and supine > sit CGA and stand pivot bed <> BSC with Min A using RW. Note pt did have on abdominal binder and no dizziness. Pt did say she felt "shaky" and does get nervous with transfers. Mod A for toileting tasks as she was able to help with brief management but in the end needed help d/t decreased balance and able to do hygiene in standing. Discussed techniques for donning brief at home, able to forward weight shift and reach toes to simulate LB dressing. Supine > sit CGA able to manage LEs. Pt c/o discomfort in LEs with wraps, communicated with PT and pt is going to leave on as long as bearable for max benefit, aware they should stay on for 3 days. She is aware that nursing can remove if too painful. Pt in bed resting with alarm on call bell in reach.   Therapy Documentation Precautions:  Precautions Precautions: Fall Precaution Comments: orthostatic hypotension, B LE ACE wraps, Abdominal binder as approriate Restrictions Weight Bearing Restrictions: Yes LUE Weight Bearing: Weight bearing as tolerated     Therapy/Group:  Individual Therapy  Viona Gilmore 12/26/2020, 4:15 PM

## 2020-12-26 NOTE — Progress Notes (Addendum)
PROGRESS NOTE   Subjective/Complaints: C/o left foot pain from when she tripped and fell. Was XRed at that time, negative for fracture, but pain has worsened today Right upper extremity hematoma is expanding.   ROS:  Pt denies SOB, abd pain, CP, N/V/C/D, and vision changes, + pain in left hip. +expanding right upper extremity hematoma  Objective:   No results found. Recent Labs    12/24/20 0705  WBC 10.9*  HGB 8.0*  HCT 24.3*  PLT 211   Recent Labs    12/24/20 0705  NA 138  K 5.1  CL 104  CO2 26  GLUCOSE 111*  BUN 22  CREATININE 0.88  CALCIUM 8.1*    Intake/Output Summary (Last 24 hours) at 12/26/2020 1108 Last data filed at 12/26/2020 0726 Gross per 24 hour  Intake 598 ml  Output --  Net 598 ml        Physical Exam: Vital Signs Blood pressure (!) 122/58, pulse 70, temperature 97.7 F (36.5 C), temperature source Oral, resp. rate 16, height 5\' 6"  (1.676 m), weight 96.3 kg, SpO2 97 %. Gen: no distress, normal appearing HEENT: oral mucosa pink and moist, NCAT Cardio: Reg rate Chest: normal effort, normal rate of breathing Abd: soft, non-distended Ext: no edema Psych: pleasant, normal affect Skin: intact Musculoskeletal:  General: Swellingand deformitypresent.  Cervical back: Normal range of motion.  Right lower leg: Edemapresent.  Left lower leg: Edemapresent.  Skin: Findings: Bruisingand erythemapresent. Also has a large purple hematoma on R elbow and L hip very bruised/purple/swollen- expanding The swelling of her hematoma on her right elbow has worsened. Resorbing her left hip incision the swelling is better she does have a long thin blister lateral to the incision the erythema is about 50% improved-from the picture below.   MS:  Cranial nerve exam unremarkable. RUE 5/5 prox to distal. LUE 4-/5 with limitations at elbow d/t pain. LUE 2/5 prox to 4/5 distally d/t pain. RLE  3+ prox to 4+/5 distally. No focal sensory findings.    Assessment/Plan: 1. Functional deficits which require 3+ hours per day of interdisciplinary therapy in a comprehensive inpatient rehab setting.  Physiatrist is providing close team supervision and 24 hour management of active medical problems listed below.  Physiatrist and rehab team continue to assess barriers to discharge/monitor patient progress toward functional and medical goals  Care Tool:  Bathing    Body parts bathed by patient: Right arm,Left arm,Chest,Abdomen,Right upper leg,Left upper leg,Face   Body parts bathed by helper: Front perineal area,Buttocks,Right lower leg,Left lower leg     Bathing assist Assist Level: 2 Helpers     Upper Body Dressing/Undressing Upper body dressing   What is the patient wearing?: Dress    Upper body assist Assist Level: Moderate Assistance - Patient 50 - 74%    Lower Body Dressing/Undressing Lower body dressing      What is the patient wearing?: Incontinence brief     Lower body assist Assist for lower body dressing: Dependent - Patient 0%     Toileting Toileting    Toileting assist Assist for toileting: Dependent - Patient 0%     Transfers Chair/bed transfer  Transfers assist  Chair/bed  transfer activity did not occur: Safety/medical concerns (orthostatic on eval)  Chair/bed transfer assist level: Minimal Assistance - Patient > 75%     Locomotion Ambulation   Ambulation assist   Ambulation activity did not occur: Safety/medical concerns (orthostatic on eval)          Walk 10 feet activity   Assist  Walk 10 feet activity did not occur: Safety/medical concerns        Walk 50 feet activity   Assist Walk 50 feet with 2 turns activity did not occur: Safety/medical concerns         Walk 150 feet activity   Assist Walk 150 feet activity did not occur: Safety/medical concerns         Walk 10 feet on uneven surface   activity   Assist Walk 10 feet on uneven surfaces activity did not occur: Safety/medical concerns         Wheelchair     Assist Will patient use wheelchair at discharge?: No (Per PT long term goals)   Wheelchair activity did not occur: Safety/medical concerns (orthostatic on eval)         Wheelchair 50 feet with 2 turns activity    Assist    Wheelchair 50 feet with 2 turns activity did not occur: Safety/medical concerns       Wheelchair 150 feet activity     Assist  Wheelchair 150 feet activity did not occur: Safety/medical concerns       Blood pressure (!) 122/58, pulse 70, temperature 97.7 F (36.5 C), temperature source Oral, resp. rate 16, height 5\' 6"  (1.676 m), weight 96.3 kg, SpO2 97 %.  Medical Problem List and Plan: 1.Functional deficitssecondary to left coronoid process fracture and left hip labral and hamstring tendon tears with associated large hematoma which required I&D, vac -patient maynot yetshower- due to orthostatic hypotension  Continue CIR -ELOS/Goals: 14 days, mod I to supervision with PT and OT 2. Antithrombotics: -DVT/anticoagulation:Pharmaceutical:Heparin-->question transition to Eliquis -antiplatelet therapy: N/A 3. Pain Management:Hydrocodone prn  4/4: schedule Tylenol 650mg  TID.  4. Mood:LCSW to follow for evaluation and support. -antipsychotic agents:  5. Neuropsych: This patientiscapable of making decisions on herown behalf. 6. Skin/Wound Care:Will monitor wound for healing. Add protein supplement to promote wound healing. -VAC removed from wound yesterday and wound closed by ortho -continue local dressing -ACE wrap removed as it creating a tourniquet effect on thigh, skin almost broken down in fact  -advised to wear compression garments as tolerated but they are tight for her and she can remove if  too painful.   4/5- also has severe R elbow hematoma- agreeable to PICC if she requires further blood draws. Advised that reason for blood draws is to monitor Hgb. Ordered PICC. Will order Hgb once placed. Given expansion of hematoma, ordered right upper extremity ultrasound.  7. Fluids/Electrolytes/Nutrition:Monitor I/O. Lytes have been stable, repeat PRN.  8. CAF: Monitor HR tid--continue amiodarone 9. Hypothyroid: On supplement 10. Orthostatic hypotension: Will continue to monitor orthostatic vitals  --Florinef increased to 0.2 mg bid on 04/01 -blood transfusion today, may need more than one unit -difficult pt to apply TEDS/abd binder to given wounds and body habitus. -encourage PO intake  4/2- per note today Florinef increased, but was yesterday- will monitor- hopefully will improve with more pRBCs today.  I got 2 of PT today-  4/4: discussed with cards PA patient wants PICC if future blood draws required.  11. H/o CVA with residual mild aphasia: On Crestor and Eliquis 12. OIC:  Has not had BM since admission --augment bowel regimen  4/2- will give Mg citrate- had at least 2 BMs today- will monitor- also was manually disimimpacted- will also increase bowel meds  4/3- had many BMs- feeling much better- con't bowel meds 13. Acute blood loss anemia: H/H trending down from 10.7-->7.2. --Recheck CBC in am.  --Transfused with one unit on 04/01, may need an additional unitespecially if she's a little dry  4/2- Hb stable at 7.2- will give 1 more unit of blood.   4/3-hemoglobin is up to 8.0-continue to monitor 14. Pre-renal azotemia: BUN up to 25-->push pofluid intake  4/2- same- will push fluids  4/3- BUN down to 22- con't to push fluids PO 15. Leucocytosis: WBC has varied since surgery. Will monitor for fevers and other signs of infection.  --recheck white count in am.   4/2- stable- will  monitor for infection.   4/3- down to 10k- con't to monitor 16. Left foot pain: ordered XR to assess for fracture given her fall. 17. Disposition: d/c on 4/19. F/u with me on 01/18/21 to arrive in office at 9:20 for 9:40     >35 minutes spent in discussion of her history of orthostasis, discussion of slow positional changes, eliminating her rugs at home, discussing with cardiology whether we may start salt tabs, ordering XR of left foot, ordering ultrasound of right arm, plus more. >50% of time spent in care or coordination of care.      LOS: 4 days A FACE TO FACE EVALUATION WAS PERFORMED  Kayla Harrison 12/26/2020, 11:08 AM

## 2020-12-26 NOTE — Progress Notes (Signed)
Physical Therapy Session Note  Patient Details  Name: Kayla Harrison MRN: [id number] Date of Birth: Jul 24, 1946  Today's Date: 12/26/2020 PT Individual Time: 1105-1230 PT Individual Time Calculation (min): 85 min   Short Term Goals: Week 1:  PT Short Term Goal 1 (Week 1): Patient will perform bed mobility with supervision consistently. PT Short Term Goal 2 (Week 1): Patient will perform basic transfers with CGA using LRAD. PT Short Term Goal 3 (Week 1): Patient will perform standing tolerace without symptoms >4 min. PT Short Term Goal 4 (Week 1): Patient will ambulate >20 ft using LRAD with min A.  Skilled Therapeutic Interventions/Progress Updates:    Patient in supine and gave history of lymphedema and current treatment at home with pump x 1 hour a day 5 days a week.  States she has been taking some measurements since staring the pump about 3 months ago.  Noted history of orthostatic hypotension with limited upright tolerance and several recent syncopal episodes.  Patient supine for measurements as noted below.  Applied stockinette, Artiflex, then short stretch wraps 6cm x 1, 8cm x 1, 10cm x 1 and 12cm x 1 to both legs.  Noted some slight color change in toes, but still blanchable and with no change in temperature, sensation or motor.  Patient supine to sit and BP measurements as noted below.  Eager to be OOB so sit to stand min to mod A and ambulated around the bed x 15' to recliner with w/c close if needed.  Once up in chair BP 138/67, HR 74.  Left with chair alarm active and needs in reach.  Lymphedema measurements in cm:   R  L [address] MT 21.5  21.5 Med mall 23  24 20  cm up MM 37  34.5 Tib tub  54.5  51 Inf pole pat 59  57 Sup pole pat 58  60  Orthostatic VS for the past 24 hrs (Last 3 readings):  BP- Lying Pulse- Lying BP- Sitting Pulse- Sitting BP- Standing at 0 minutes Pulse- Standing at 0 minutes BP- Standing at 3 minutes Pulse- Standing at 3 minutes  12/26/20 1200 117/57 69  116/58 72 (!) 80/46 77 90/75 101    Therapy Documentation Precautions:  Precautions Precautions: Fall Precaution Comments: orthostatic hypotension, B LE ACE wraps, Abdominal binder as approriate Restrictions Weight Bearing Restrictions: Yes LUE Weight Bearing: Weight bearing as tolerated Pain: Pain Assessment Pain Scale: 0-10 Pain Score: 4  Pain Type: Surgical pain Pain Location: Hip Pain Orientation: Left Pain Descriptors / Indicators: Aching;Sore Pain Onset: On-going Pain Intervention(s): Repositioned   Therapy/Group: Individual Therapy  Reginia Naas  Magda Kiel, PT 12/26/2020, 11:24 AM

## 2020-12-26 NOTE — Progress Notes (Signed)
Electrophysiology Rounding Note  Patient Name: Kayla Harrison Date of Encounter: 12/26/2020  Electrophysiologist: [name], MD  Patient Profile     Admitted with a fall fracture of her elbow, ribs. Also bruised hip Orthostatic vital signs showed profound decrease in blood pressure 110--80 going from lying to sitting. S/p Hip hematoma resection Still OI    Subjective   Feeling better Discussed the role of PICC  Dont see a lot of blood draws necessary   Inpatient Medications    Scheduled Meds: . acetaminophen  650 mg Oral TID  . amiodarone  200 mg Oral Daily  . apixaban  5 mg Oral BID  . fludrocortisone  0.2 mg Oral BID WC  . levothyroxine  137 mcg Oral Once per day on Sun Mon Tue Wed Thu Fri  . levothyroxine  68.5 mcg Oral Once per day on Sat  . lidocaine  2 patch Transdermal Q24H  . multivitamin with minerals  1 tablet Oral Daily  . rosuvastatin  10 mg Oral Once per day on Mon Thu  . senna  2 tablet Oral BID   Continuous Infusions:  PRN Meds: alum & mag hydroxide-simeth, bisacodyl, diphenhydrAMINE, guaiFENesin-dextromethorphan, oxyCODONE, polyethylene glycol, prochlorperazine **OR** prochlorperazine **OR** prochlorperazine, sodium phosphate, traZODone   Vital Signs    Vitals:   12/25/20 1147 12/25/20 1835 12/25/20 1930 12/26/20 0515  BP:   (!) 105/59 (!) 122/58  Pulse:   68 70  Resp:   17 16  Temp:   98.2 F (36.8 C) 97.7 F (36.5 C)  TempSrc:    Oral  SpO2:   96% 97%  Weight:  96.3 kg    Height: 5\' 6"  (1.676 m)       Intake/Output Summary (Last 24 hours) at 12/26/2020 0721 Last data filed at 12/25/2020 1837 Gross per 24 hour  Intake 600 ml  Output --  Net 600 ml   Filed Weights   12/25/20 1835  Weight: 96.3 kg    Physical Exam  Well developed and nourished in no acute distress HENT normal Neck supple   Clear Regular rate and rhythm, no murmurs or gallops Abd-soft with active BS No Clubbing cyanosis edema Skin-warm and dry A &  Oriented  Grossly normal sensory and motor function   *   Labs    CBC Recent Labs    12/24/20 0705  WBC 10.9*  NEUTROABS 8.5*  HGB 8.0*  HCT 24.3*  MCV 91.0  PLT 774   Basic Metabolic Panel Recent Labs    12/24/20 0705  NA 138  K 5.1  CL 104  CO2 26  GLUCOSE 111*  BUN 22  CREATININE 0.88  CALCIUM 8.1*   Liver Function Tests No results for input(s): AST, ALT, ALKPHOS, BILITOT, PROT, ALBUMIN in the last 72 hours. No results for input(s): LIPASE, AMYLASE in the last 72 hours. Cardiac Enzymes No results for input(s): CKTOTAL, CKMB, CKMBINDEX, TROPONINI in the last 72 hours.      Assessment & Plan    Atrial fibrillation-persistent  Amiodarone therapy-longstanding  Sinus node dysfunction status post pacemaker  Orthostatic hypotension-profound  Falls-recurrent  Anemia   Hip hematoma       Continue florinef. Still orthostatic Need to continue to do Orthostatic VS-- trying to see what happens to HR   Reverse trendelenburg  OOB to chair to avoid aggravation of orthostasis 2/2 bed rest  Would use abdominal binder ( can probably only tolerated 6-9" so may need to cut) and will prob benefit from  thigh sleeves  Limit blood draws  With concerns of hip firmness, does she need repeat hip CT ?     For questions or updates, please contact [place] Please consult [email or web address] for contact info under Cardiology/STEMI.  Signed, [name], MD  12/26/2020, 7:21 AM

## 2020-12-26 NOTE — Discharge Instructions (Addendum)
Inpatient Rehab Discharge Instructions  Kayla Harrison Discharge date and time: 01/09/21   Activities/Precautions/ Functional Status: Activity: no lifting, driving, or strenuous exercise till cleared by MD Diet: cardiac diet Wound Care: keep wound clean and dry. Contact MD if you develop any problems with your incision/wound--redness, swelling, increase in pain, drainage or if you develop fever or chills.    Functional status:  ___ No restrictions     ___ Walk up steps independently ___ 24/7 supervision/assistance   ___ Walk up steps with assistance _X__ Intermittent supervision/assistance  _X__ Bathe/dress independently ___ Walk with walker     ___ Bathe/dress with assistance ___ Walk Independently    ___ Shower independently ___ Walk with assistance    _X__ Shower with supervision _X__ No alcohol     ___ Return to work/school ________  Special Instructions:  COMMUNITY REFERRALS UPON DISCHARGE:    Home Health:   PT     OT    SN                Agency: [facility]  Phone: [phone]       My questions have been answered and I understand these instructions. I will adhere to these goals and the provided educational materials after my discharge from the hospital.  Patient/Caregiver Signature _______________________________ Date __________  Clinician Signature _______________________________________ Date __________  Please bring this form and your medication list with you to all your follow-up doctor's appointments.    Information on my medicine - ELIQUIS (apixaban)  This medication education was reviewed with me or my healthcare representative as part of my discharge preparation.   Why was Eliquis prescribed for you? Eliquis was prescribed for you to reduce the risk of a blood clot forming that can cause a stroke if you have a medical condition called atrial fibrillation (a type of irregular heartbeat).  What do You need to know about Eliquis ? Take your  Eliquis TWICE DAILY - one tablet in the morning and one tablet in the evening with or without food. If you have difficulty swallowing the tablet whole please discuss with your pharmacist how to take the medication safely.  Take Eliquis exactly as prescribed by your doctor and DO NOT stop taking Eliquis without talking to the doctor who prescribed the medication.  Stopping may increase your risk of developing a stroke.  Refill your prescription before you run out.  After discharge, you should have regular check-up appointments with your healthcare provider that is prescribing your Eliquis.  In the future your dose may need to be changed if your kidney function or weight changes by a significant amount or as you get older.  What do you do if you miss a dose? If you miss a dose, take it as soon as you remember on the same day and resume taking twice daily.  Do not take more than one dose of ELIQUIS at the same time to make up a missed dose.  Important Safety Information A possible side effect of Eliquis is bleeding. You should call your healthcare provider right away if you experience any of the following: ? Bleeding from an injury or your nose that does not stop. ? Unusual colored urine (red or dark brown) or unusual colored stools (red or black). ? Unusual bruising for unknown reasons. ? A serious fall or if you hit your head (even if there is no bleeding).  Some medicines may interact with Eliquis and might increase your risk of bleeding or clotting while  on Eliquis. To help avoid this, consult your healthcare provider or pharmacist prior to using any new prescription or non-prescription medications, including herbals, vitamins, non-steroidal anti-inflammatory drugs (NSAIDs) and supplements.  This website has more information on Eliquis (apixaban): http://www.eliquis.com/eliquis/home

## 2020-12-26 NOTE — Progress Notes (Signed)
Physical Therapy Session Note  Patient Details  Name: Kayla Harrison MRN: [id number] Date of Birth: 1946/08/08  Today's Date: 12/26/2020 PT Individual Time: 0800-0840 PT Individual Time Calculation (min): 40 min   Short Term Goals: Week 1:  PT Short Term Goal 1 (Week 1): Patient will perform bed mobility with supervision consistently. PT Short Term Goal 2 (Week 1): Patient will perform basic transfers with CGA using LRAD. PT Short Term Goal 3 (Week 1): Patient will perform standing tolerace without symptoms >4 min. PT Short Term Goal 4 (Week 1): Patient will ambulate >20 ft using LRAD with min A.  Skilled Therapeutic Interventions/Progress Updates:   Received pt supine in bed with NT present assisting pt to get thigh high ted hose on. Per Dr Caryl Comes, ted hose to be put on daily. Assisted NT in donning ted hose with total A, however ted hose uncomfortable to pt and rolling down thighs due to swelling from lymphedema; RN and MD notified. Pt reported spreading of bruising on RUE and increased "tenderness" along L hip; notifed treatment team. BP in supine: 115/58 and pt asymptomatic. Pt reported urge to urinate. Attempted to locate bedside commode, however bedside commode in room too small; therefore used bedpan due to urgency. Rolled L and R with supervision and use of bedrails and required total A to place bedpan. Pt able to void and required total A for peri-care. Donned clean brief with max A with pt able to bridge twice to assist therapist. Pt transferred supine<>sitting EOB with CGA and use of bedrails and denied any orthostatic symptoms. Stand<>pivot bed<>recliner with RW and min A and BP in standing: 89/63 with pt reporting increased lightheadedness after ~1 minute therefore returned to sitting. Abdominal binder delivered to room during session however pt politely declined putting it on due to discomfortPer RN, Cardiology requested BP to be taken after 3 minutes of standing each day; however  during this session pt unablele to safelet tolerate standing for 3 minutes. Concluded session with pt sitting in recliner, needs within reach, and chair pad alarm on.  Therapy Documentation Precautions:  Precautions Precautions: Fall Precaution Comments: orthostatic hypotension, B LE ACE wraps, Abdominal binder as approriate Restrictions Weight Bearing Restrictions: Yes LUE Weight Bearing: Weight bearing as tolerated  Therapy/Group: Individual Therapy Alfonse Alpers PT, DPT   12/26/2020, 7:34 AM

## 2020-12-26 NOTE — Patient Care Conference (Signed)
Inpatient RehabilitationTeam Conference and Plan of Care Update Date: 12/26/2020   Time: 9:37 AM    Patient Name: Kayla Harrison      Medical Record Number: [id number]  Date of Birth: 1946/03/03 Sex: Female         Room/Bed: 6Y69S/8N46E-70 Payor Info: Payor: MEDICARE / Plan: MEDICARE PART A AND B / Product Type: *No Product type* /    Admit Date/Time:  12/22/2020  4:49 PM  Primary Diagnosis:  Physical debility  Hospital Problems: Principal Problem:   Physical debility Active Problems:   Constipation   Orthostatic hypotension   Tear of left hamstring   Labral tear of left hip joint   Acute blood loss anemia    Expected Discharge Date: Expected Discharge Date: 01/09/21  Team Members Present: Physician leading conference: Dr. Leeroy Cha Care Coodinator Present: Erlene Quan, BSW;Caran Storck Creig Hines, RN, BSN, [place] Nurse Present: Dorthula Nettles, RN PT Present: Becky Sax, PT OT Present: Other (comment) Providence Lanius) [place] Coordinator present : Gunnar Fusi, SLP     Current Status/Progress Goal Weekly Team Focus  Bowel/Bladder   Patient is continent of B/B,LBM 4/4/.2022  Maintain normalcy  Assess toileting needs QS/PRN,   Swallow/Nutrition/ Hydration             ADL's   S for UBB, mod A UBD, dep for LBB/LBD, Min A stand-pivot to toilet  S to min A  ADL/functional mobility/balance retraining, energy conservation, pt/family/DME/AE education   Mobility   min A bed mobility and for sit<>stands. Limited by orthostatics  mod I/supervision  functional mobility/transfers, generalized strengthening, dynamic standing balance/coordination, and improved activity tolerance   Communication             Safety/Cognition/ Behavioral Observations            Pain   Patient is currently on Lidocaine patches x83m Oxycodone Q3hrs, prn  pain <= 3  Assess QS/PRN pain ,reassess   Skin   Patient has large swollen purpish right arm/elbow Hematoma and eft hip and leg surgical  incision with purpish discolraion, swelling and drainage, ,  Prevention of infection and effective wound healing without complications  Assess qs/prn with dressing changes prn, educate patient, family  and staff     Discharge Planning:  pt plans to d/c MOD I. Sisters able to assit at d/c. Primary sister potentially coming down from [place] to assist   Team Discussion: Sodium still low, will have to try salt tablets, bleeds easily, may need PICC line for lab draws, complains of left foot pain, x-rays today. She request that therapy push her a little bit because she will be going home alone with no support. She has one sister that might be able to come and stay for a little while.   Patient on target to meet rehab goals: Yes, min assist to contact guard for bed mobility. She can't stand long enough to get a 3 minute standing BP for the orthostatic BP reading. Goals are supervision/mod I. She is supervision for ADL's with mod I goals.   *See Care Plan and progress notes for long and short-term goals.   Revisions to Treatment Plan:  Going to discuss with Cardiology about starting salt tablets.  Teaching Needs: Family education, medication management, pain management, orthostatic hypotension education, skin/wound care, transfer training, gait training, balance training, endurance training, activity tolerance.  Current Barriers to Discharge: Decreased caregiver support, Medical stability, Home enviroment access/layout, Incontinence, Wound care, Lack of/limited family support, Weight, Weight bearing restrictions, Medication compliance and  Behavior  Possible Resolutions to Barriers: Continue current medications, provide emotional support.     Medical Summary Current Status: orthostatic hypotension, left thigh pain, right arm hematoma  Barriers to Discharge: Decreased family/caregiver support;Medical stability;Wound care  Barriers to Discharge Comments: no family support, orthostatic hypotension, left  thigh pain, right arm hematoma Possible Resolutions to Barriers/Weekly Focus: see if we can get her hospital bed, discuss with cardiology regarding starting salt tabs for hypotension, orthostatic vitals, daily wound monitoring, ultrasound right arm today, discussion of whether she would benefit from PICC.   Continued Need for Acute Rehabilitation Level of Care: The patient requires daily medical management by a physician with specialized training in physical medicine and rehabilitation for the following reasons: Direction of a multidisciplinary physical rehabilitation program to maximize functional independence : Yes Medical management of patient stability for increased activity during participation in an intensive rehabilitation regime.: Yes Analysis of laboratory values and/or radiology reports with any subsequent need for medication adjustment and/or medical intervention. : Yes   I attest that I was present, lead the team conference, and concur with the assessment and plan of the team.   Cristi Loron 12/26/2020, 12:59 PM

## 2020-12-26 NOTE — Progress Notes (Signed)
Team Conference Report to Patient/Family  Team Conference discussion was reviewed with the patient and caregiver, including goals, any changes in plan of care and target discharge date.  Patient and caregiver express understanding and are in agreement.  The patient has a target discharge date of 01/09/21.  SW met with pt and sister (via telephone) provided updates. No questions or concerns currently. SW will cont to follow up  Dyanne Iha 12/26/2020, 1:48 PM

## 2020-12-26 NOTE — Progress Notes (Signed)
Orthopedic Tech Progress Note Patient Details:  Kayla Harrison 10-30-1945 [id number] 4W called requesting an ABDOMINAL BINDER for patient. Patient was working with THERAPY was working with patient at that time Ortho Devices Type of Ortho Device: Abdominal binder Ortho Device/Splint Location: STOMACH   Post Interventions Patient Tolerated: Well Instructions Provided: Care of device   Janit Pagan 12/26/2020, 9:14 AM

## 2020-12-26 NOTE — Progress Notes (Incomplete)
Resting in bed      2300 Patient states still having pain to left hip and LE's pain score  8/10 and also need something for sleep.Patient was medicated with prn Oxycodone 5mg  and Trazodone and repositioned in bed, Still refused Lidoderm patches and Tylenol.Verbalizes discomfort to LE/s of bilateral acewrap applied by therapy, causing discomfort and difficulty in resting and moving. Readjusted with extra pillows in bed.Continue to monitor   0345 Informed by Penny,RN( IVT) patients refusal to have phelbomist draw labs due to discont.Marland Kitchen of mid-line site

## 2020-12-27 ENCOUNTER — Inpatient Hospital Stay (HOSPITAL_COMMUNITY): Payer: Medicare Other

## 2020-12-27 DIAGNOSIS — M79601 Pain in right arm: Secondary | ICD-10-CM

## 2020-12-27 DIAGNOSIS — T148XXA Other injury of unspecified body region, initial encounter: Secondary | ICD-10-CM

## 2020-12-27 LAB — CBC WITH DIFFERENTIAL/PLATELET
Abs Immature Granulocytes: 0.09 10*3/uL — ABNORMAL HIGH (ref 0.00–0.07)
Basophils Absolute: 0 10*3/uL (ref 0.0–0.1)
Basophils Relative: 0 %
Eosinophils Absolute: 0.2 10*3/uL (ref 0.0–0.5)
Eosinophils Relative: 2 %
HCT: 30.5 % — ABNORMAL LOW (ref 36.0–46.0)
Hemoglobin: 9.3 g/dL — ABNORMAL LOW (ref 12.0–15.0)
Immature Granulocytes: 1 %
Lymphocytes Relative: 9 %
Lymphs Abs: 0.9 10*3/uL (ref 0.7–4.0)
MCH: 30 pg (ref 26.0–34.0)
MCHC: 30.5 g/dL (ref 30.0–36.0)
MCV: 98.4 fL (ref 80.0–100.0)
Monocytes Absolute: 0.9 10*3/uL (ref 0.1–1.0)
Monocytes Relative: 9 %
Neutro Abs: 7.5 10*3/uL (ref 1.7–7.7)
Neutrophils Relative %: 79 %
Platelets: 335 10*3/uL (ref 150–400)
RBC: 3.1 MIL/uL — ABNORMAL LOW (ref 3.87–5.11)
RDW: 18.9 % — ABNORMAL HIGH (ref 11.5–15.5)
WBC: 9.6 10*3/uL (ref 4.0–10.5)
nRBC: 0 % (ref 0.0–0.2)

## 2020-12-27 LAB — BASIC METABOLIC PANEL
Anion gap: 4 — ABNORMAL LOW (ref 5–15)
BUN: 20 mg/dL (ref 8–23)
CO2: 29 mmol/L (ref 22–32)
Calcium: 8.3 mg/dL — ABNORMAL LOW (ref 8.9–10.3)
Chloride: 105 mmol/L (ref 98–111)
Creatinine, Ser: 0.92 mg/dL (ref 0.44–1.00)
GFR, Estimated: >60 mL/min (ref >60)
Glucose, Bld: 119 mg/dL — ABNORMAL HIGH (ref 70–99)
Potassium: 4.2 mmol/L (ref 3.5–5.1)
Sodium: 138 mmol/L (ref 135–145)

## 2020-12-27 LAB — PROTIME-INR
INR: 1.4 — ABNORMAL HIGH (ref 0.8–1.2)
Prothrombin Time: 16.7 seconds — ABNORMAL HIGH (ref 11.4–15.2)

## 2020-12-27 NOTE — Progress Notes (Signed)
Right upper ext venous US completed     Please see CV Proc for preliminary results.   Ajani Rineer, RVT  

## 2020-12-27 NOTE — Progress Notes (Signed)
Visit to room for [place]. Midline with no blood return and pt complains of pain with flushing. Midline discontinued; pt declines restart at this time. No IV medications ordered at present. RN notified.

## 2020-12-27 NOTE — Progress Notes (Addendum)
Electrophysiology Rounding Note  Patient Name: Kayla Harrison Date of Encounter: 12/27/2020  Primary Cardiologist: [name], MD  Subjective   Had mild lightheadedness and fatigue working with fatigue consistent with BP in the 80s, improved with rest.   LUE bruising has worsened and pending [place].   She has resigned herself to infrequent labwork if needed. Midline clogged this am and has been discontinued. No labs were drawn off of it after placement, so most recent labs 4/3.  Concerned about sodium supplements  Sitting up and feeling some stronger but this waxes and wanes   Inpatient Medications    Scheduled Meds: . acetaminophen  650 mg Oral TID  . amiodarone  200 mg Oral Daily  . apixaban  5 mg Oral BID  . fludrocortisone  0.2 mg Oral BID WC  . levothyroxine  137 mcg Oral Once per day on Sun Mon Tue Wed Thu Fri  . levothyroxine  68.5 mcg Oral Once per day on Sat  . lidocaine  2 patch Transdermal Q24H  . multivitamin with minerals  1 tablet Oral Daily  . rosuvastatin  10 mg Oral Once per day on Mon Thu  . senna  2 tablet Oral BID  . sodium chloride  0.5 g Oral BID WC   Continuous Infusions:  PRN Meds: alum & mag hydroxide-simeth, bisacodyl, diphenhydrAMINE, guaiFENesin-dextromethorphan, oxyCODONE, polyethylene glycol, prochlorperazine **OR** prochlorperazine **OR** prochlorperazine, sodium phosphate, traZODone   Vital Signs    Vitals:   12/26/20 0515 12/26/20 1633 12/26/20 1925 12/27/20 0503  BP: (!) 122/58 119/70 (!) 135/58 (!) 118/52  Pulse: 70 69 70 70  Resp: 16 18 17 16   Temp: 97.7 F (36.5 C) 98.2 F (36.8 C) 98.1 F (36.7 C) 98.2 F (36.8 C)  TempSrc: Oral Oral Oral   SpO2: 97% 100% 100% 95%  Weight:      Height:        Intake/Output Summary (Last 24 hours) at 12/27/2020 1319 Last data filed at 12/27/2020 0831 Gross per 24 hour  Intake 710 ml  Output --  Net 710 ml   Filed Weights   12/25/20 1835  Weight: 96.3 kg    Physical Exam    GEN-  The patient is well appearing, alert and oriented x 3 today.   Head- normocephalic, atraumatic Eyes-  Sclera clear, conjunctiva pink Ears- hearing intact Oropharynx- clear Neck- supple Lungs- Clear to ausculation bilaterally, normal work of breathing Heart- Regular rate and rhythm, no murmurs, rubs or gallops GI- soft, NT, ND, + BS Extremities- no clubbing or cyanosis. No edema Skin- no rash or lesion Psych- euthymic mood, full affect Neuro- strength and sensation are intact  Labs    CBC No results for input(s): WBC, NEUTROABS, HGB, HCT, MCV, PLT in the last 72 hours. Basic Metabolic Panel No results for input(s): NA, K, CL, CO2, GLUCOSE, BUN, CREATININE, CALCIUM, MG, PHOS in the last 72 hours. Liver Function Tests No results for input(s): AST, ALT, ALKPHOS, BILITOT, PROT, ALBUMIN in the last 72 hours. No results for input(s): LIPASE, AMYLASE in the last 72 hours. Cardiac Enzymes No results for input(s): CKTOTAL, CKMB, CKMBINDEX, TROPONINI in the last 72 hours.   Telemetry    Not connected  Radiology    DG Foot Complete Left  Result Date: 12/26/2020 CLINICAL DATA:  75 year old female with pain over the base of the fifth metatarsal. EXAM: LEFT FOOT - COMPLETE 3+ VIEW COMPARISON:  None. FINDINGS: A faint linear lucency through the base of the fifth metatarsal  is likely artifactual or represent vascular groove. A nondisplaced fracture is not excluded. Evaluation for fracture is limited due to advanced osteopenia. No other fracture identified. There is no dislocation. The soft tissues are grossly unremarkable. IMPRESSION: Artifact versus less likely a nondisplaced fracture of the base of the fifth metatarsal. Electronically Signed   By: Anner Crete M.D.   On: 12/26/2020 16:53   [place] EKG SITE RITE  Result Date: 12/26/2020 If Site Rite image not attached, placement could not be confirmed due to current cardiac rhythm.   Patient Profile     Admitted with a fall fracture of her  elbow, ribs. Also bruised hip Orthostatic vital signs showed profound decrease in blood pressure 110--80 going from lying to sitting.  Assessment & Plan    Atrial fibrillation-persistent  Amiodarone therapy-longstanding  Sinus node dysfunction status post pacemaker  Orthostatic hypotension-profound  Falls-recurrent  Anemia  Hip hematoma   Continue orthostatic Vitals signs.   Salt tabs added 4/5. No clear improvement.  -BP Supine: 111/56; pt asymptomatic -BP Sitting: 86/56; pt asymptomatic -BP Sitting with abdominal binder on: 95/62 and pt asymptomatic -BP Standing: unable to stand long enough for reading; donned abdominal binder and stood again BP: 63/47 and pt symptomatic   Midline placed 4/5 but clotted this am. She is resigned to infrequent peripheral lab draws by IV team. Will discuss timing. Her labs are several days old so we would at least like to update her BMET and CBC.   Refusing lymph edema wraps due to comfort.   Worsening ecchymosis R arm. Pending dopplers.   For questions or updates, please contact [place] Please consult [email or web address] for contact info under Cardiology/STEMI.  Signed, Shirley Friar, PA-C  12/27/2020, 1:19 PM   (As above)  The abdominal binder is too big  Will cut to 6 inches  For now will continue sodium and fludrocortisone, but I do worry about edema and then wouldhave to stop  At that juncture, proamatine might be better tolerated

## 2020-12-27 NOTE — Progress Notes (Signed)
PROGRESS NOTE   Subjective/Complaints: Continues to have symptomatic hypotension with therapy.  Lymphedema compression garments has helped with her swelling, but is very painful for her Right hematoma expanding  ROS:  Pt denies SOB, abd pain, CP, N/V/C/D, and vision changes, + pain in left hip. +expanding right upper extremity hematoma  Objective:   DG Foot Complete Left  Result Date: 12/26/2020 CLINICAL DATA:  75 year old female with pain over the base of the fifth metatarsal. EXAM: LEFT FOOT - COMPLETE 3+ VIEW COMPARISON:  None. FINDINGS: A faint linear lucency through the base of the fifth metatarsal is likely artifactual or represent vascular groove. A nondisplaced fracture is not excluded. Evaluation for fracture is limited due to advanced osteopenia. No other fracture identified. There is no dislocation. The soft tissues are grossly unremarkable. IMPRESSION: Artifact versus less likely a nondisplaced fracture of the base of the fifth metatarsal. Electronically Signed   By: Anner Crete M.D.   On: 12/26/2020 16:53   [place] EKG SITE RITE  Result Date: 12/26/2020 If Site Rite image not attached, placement could not be confirmed due to current cardiac rhythm.  No results for input(s): WBC, HGB, HCT, PLT in the last 72 hours. No results for input(s): NA, K, CL, CO2, GLUCOSE, BUN, CREATININE, CALCIUM in the last 72 hours.  Intake/Output Summary (Last 24 hours) at 12/27/2020 1128 Last data filed at 12/27/2020 0831 Gross per 24 hour  Intake 710 ml  Output --  Net 710 ml        Physical Exam: Vital Signs Blood pressure (!) 118/52, pulse 70, temperature 98.2 F (36.8 C), resp. rate 16, height 5\' 6"  (1.676 m), weight 96.3 kg, SpO2 95 %. Gen: no distress, normal appearing HEENT: oral mucosa pink and moist, NCAT Cardio: Reg rate Chest: normal effort, normal rate of breathing Abd: soft, non-distended Ext: no edema Psych:  pleasant, normal affect Skin: intact Musculoskeletal:  General: Swellingand deformitypresent.  Cervical back: Normal range of motion.  Right lower leg: Edemapresent.  Left lower leg: Edemapresent.  Skin: Findings: Bruisingand erythemapresent. Also has a large purple hematoma on R elbow and L hip very bruised/purple/swollen- expanding The swelling of her hematoma on her right elbow has worsened. Resorbing her left hip incision the swelling is better she does have a long thin blister lateral to the incision the erythema is about 50% improved-from the picture below.   MS:  Cranial nerve exam unremarkable. RUE 5/5 prox to distal. LUE 4-/5 with limitations at elbow d/t pain. LUE 2/5 prox to 4/5 distally d/t pain. RLE 3+ prox to 4+/5 distally. No focal sensory findings.    Assessment/Plan: 1. Functional deficits which require 3+ hours per day of interdisciplinary therapy in a comprehensive inpatient rehab setting.  Physiatrist is providing close team supervision and 24 hour management of active medical problems listed below.  Physiatrist and rehab team continue to assess barriers to discharge/monitor patient progress toward functional and medical goals  Care Tool:  Bathing    Body parts bathed by patient: Right arm,Left arm,Chest,Abdomen,Right upper leg,Left upper leg,Face   Body parts bathed by helper: Front perineal area,Buttocks,Right lower leg,Left lower leg     Bathing assist Assist Level: 2  Helpers     Upper Body Dressing/Undressing Upper body dressing   What is the patient wearing?: Dress    Upper body assist Assist Level: Moderate Assistance - Patient 50 - 74%    Lower Body Dressing/Undressing Lower body dressing      What is the patient wearing?: Incontinence brief     Lower body assist Assist for lower body dressing: Dependent - Patient 0%     Toileting Toileting    Toileting assist Assist for toileting: Moderate Assistance -  Patient 50 - 74%     Transfers Chair/bed transfer  Transfers assist  Chair/bed transfer activity did not occur: Safety/medical concerns (orthostatic on eval)  Chair/bed transfer assist level: Minimal Assistance - Patient > 75%     Locomotion Ambulation   Ambulation assist   Ambulation activity did not occur: Safety/medical concerns (orthostatic on eval)          Walk 10 feet activity   Assist  Walk 10 feet activity did not occur: Safety/medical concerns        Walk 50 feet activity   Assist Walk 50 feet with 2 turns activity did not occur: Safety/medical concerns         Walk 150 feet activity   Assist Walk 150 feet activity did not occur: Safety/medical concerns         Walk 10 feet on uneven surface  activity   Assist Walk 10 feet on uneven surfaces activity did not occur: Safety/medical concerns         Wheelchair     Assist Will patient use wheelchair at discharge?: No (Per PT long term goals)   Wheelchair activity did not occur: Safety/medical concerns (orthostatic on eval)         Wheelchair 50 feet with 2 turns activity    Assist    Wheelchair 50 feet with 2 turns activity did not occur: Safety/medical concerns       Wheelchair 150 feet activity     Assist  Wheelchair 150 feet activity did not occur: Safety/medical concerns       Blood pressure (!) 118/52, pulse 70, temperature 98.2 F (36.8 C), resp. rate 16, height 5\' 6"  (1.676 m), weight 96.3 kg, SpO2 95 %.  Medical Problem List and Plan: 1.Functional deficitssecondary to left coronoid process fracture and left hip labral and hamstring tendon tears with associated large hematoma which required I&D, vac -patient maynot yetshower- due to orthostatic hypotension  Continue CIR -ELOS/Goals: 14 days, mod I to supervision with PT and OT 2. Antithrombotics: -DVT/anticoagulation:Pharmaceutical:Heparin-->question transition to  Eliquis -antiplatelet therapy: N/A 3. Pain Management:Hydrocodone prn  Continue Tylenol 650mg  TID.  4. Mood:LCSW to follow for evaluation and support. -antipsychotic agents:  5. Neuropsych: This patientiscapable of making decisions on herown behalf. 6. Skin/Wound Care:Will monitor wound for healing. Add protein supplement to promote wound healing. -VAC removed from wound yesterday and wound closed by ortho -continue local dressing -ACE wrap removed as it creating a tourniquet effect on thigh, skin almost broken down in fact  -advised to wear compression garments as tolerated but they are tight for her and she can remove if too painful.   4/5- also has severe R elbow hematoma- agreeable to PICC if she requires further blood draws. Advised that reason for blood draws is to monitor Hgb. Ordered PICC. Will order Hgb once placed. Given expansion of hematoma, ordered right upper extremity ultrasound.  7. Fluids/Electrolytes/Nutrition:Monitor I/O. Lytes have been stable, repeat PRN.  8. CAF: Monitor HR tid--continue amiodarone  9. Hypothyroid: On supplement 10. Orthostatic hypotension: Will continue to monitor orthostatic vitals  --Florinef increased to 0.2 mg bid on 04/01 -blood transfusion today, may need more than one unit -difficult pt to apply TEDS/abd binder to given wounds and body habitus. -encourage PO intake  4/2- per note today Florinef increased, but was yesterday- will monitor- hopefully will improve with more pRBCs today.  I got 2 of PT today-  4/4: discussed with cards PA patient wants PICC if future blood draws required.   4/6: slat tabs started 11. H/o CVA with residual mild aphasia: On Crestor and Eliquis 12. OIC: Has not had BM since admission --augment bowel regimen  4/2- will give Mg citrate- had at least 2 BMs today- will  monitor- also was manually disimimpacted- will also increase bowel meds  4/3- had many BMs- feeling much better- con't bowel meds 13. Acute blood loss anemia: H/H trending down from 10.7-->7.2. --Recheck CBC in am.  --Transfused with one unit on 04/01, may need an additional unitespecially if she's a little dry  4/2- Hb stable at 7.2- will give 1 more unit of blood.   4/3-hemoglobin is up to 8.0-continue to monitor 14. Pre-renal azotemia: BUN up to 25-->push pofluid intake  4/2- same- will push fluids  4/3- BUN down to 22- con't to push fluids PO 15. Leucocytosis: WBC has varied since surgery. Will monitor for fevers and other signs of infection.  --recheck white count in am.   4/2- stable- will monitor for infection.   4/3- down to 10k- con't to monitor 16. Left foot pain: ordered XR to assess for fracture given her fall. 17. Lymphedema: continue garments as much as tolerated; may remove if painful.  17. Disposition: d/c on 4/19. F/u with me on 01/18/21 to arrive in office at 9:20 for 9:40        LOS: 5 days A FACE TO [place] 12/27/2020, 11:28 AM

## 2020-12-27 NOTE — Progress Notes (Addendum)
PA Kayla Harrison ordered labs BMP and CBC with diff. Patient is refusing lab draws at this time. Stated she is now feeling better.

## 2020-12-27 NOTE — Plan of Care (Signed)
  Problem: Consults Goal: Skin Care Protocol Initiated - if Braden Score 18 or less Description: If consults are not indicated, leave blank or document N/A Outcome: Progressing   Problem: RH BOWEL ELIMINATION Goal: RH STG MANAGE BOWEL WITH ASSISTANCE Description: STG Manage Bowel with mod I Assistance. Outcome: Progressing   Problem: RH BLADDER ELIMINATION Goal: RH STG MANAGE BLADDER WITH ASSISTANCE Description: STG Manage Bladder With mod I Assistance Outcome: Progressing   Problem: RH SKIN INTEGRITY Goal: RH STG MAINTAIN SKIN INTEGRITY WITH ASSISTANCE Description: STG Maintain Skin Integrity With MOD I Assistance. Outcome: Progressing   Problem: RH SAFETY Goal: RH STG ADHERE TO SAFETY PRECAUTIONS W/ASSISTANCE/DEVICE Description: STG Adhere to Safety Precautions With cues and reminders Outcome: Progressing

## 2020-12-27 NOTE — Progress Notes (Signed)
Patient BP 80/44 with therapy. Advised that patient rest for some time to recover. Recheck of BP 115/54. Patient complains of not feeling well still. PA Reesa Chew notified and continue to monitor patient at this time.

## 2020-12-27 NOTE — Progress Notes (Signed)
Physical Therapy Session Note  Patient Details  Name: Kayla Harrison MRN: [id number] Date of Birth: June 02, 1946  Today's Date: 12/27/2020 PT Individual Time: 0037-0488 and 8916-9450 PT Individual Time Calculation (min): 57 min and 25 min  Short Term Goals: Week 1:  PT Short Term Goal 1 (Week 1): Patient will perform bed mobility with supervision consistently. PT Short Term Goal 2 (Week 1): Patient will perform basic transfers with CGA using LRAD. PT Short Term Goal 3 (Week 1): Patient will perform standing tolerace without symptoms >4 min. PT Short Term Goal 4 (Week 1): Patient will ambulate >20 ft using LRAD with min A.  Skilled Therapeutic Interventions/Progress Updates:   Treatment Session 1: 3888-2800 57 min Received pt semi-reclined in bed, pt agreeable to therapy, and reported pain 3/10 in L hip and 7/10 pain in BLE from lymphedema wraps (premedicated). Notified certified Lymphedema PT for suggestions for wraps. Pt also reported not sleeping well last night due to interruptions and discomfort. Session with focus on functional mobility/transfers, generalized strengthening, dynamic standing balance/coordination, and improved activity tolerance.  Orthostatics: -BP Supine: 111/56; pt asymptomatic -BP Sitting: 86/56; pt asymptomatic -BP Sitting with abdominal binder on: 95/62 and pt asymptomatic -BP Standing: unable to stand long enough for reading; donned abdominal binder and stood again BP: 63/47 and pt symptomatic  Pt transported to 4W dayroom in [facility] total A for time management purposes and transferred sit<>stand on scale with min A x 3 trials to obtain weight; 212lbs. Pt transported back to room in [facility] total A and transferred WC<>recliner with RW and min A. Doffed Lymphedema wraps with total A and increased time and noted reduction in edema with removal of wrapping. MD present for morning rounds. Concluded session with pt sitting in recliner, needs within reach, and chair pad alarm on. MD  present at bedside. Nursing updated that they are cleared to remove lymphedema wrapping and pt is able to transfer to bedside commode with min A of 1 with RW and abdominal binder on.  Treatment Session 2: 3491-7915 25 min Received pt supine in bed, pt agreeable to therapy, and denied any pain during session and reported feeling much better than this morning. Session with focus on functional mobility/transfers, generalized strengthening, dynamic standing balance/coordination, gait training, and improved activity tolerance. Supine<>sitting EOB with supervision and use of bedrails and donned abdominal binder with max A.  Orthostatics: -Sitting: 109/65;  -Standing: 89/69; pt immediately requesting to sit after obtaining BP reading After extended rest break, pt transferred sit<>stand with RW and min A and ambulated 97ft x 2 trials with RW and CGA. Pt reported mild dizziness but symptoms resolved with seated rest break. Concluded session with pt sitting in recliner, needs within reach, and chair pad alarm on. Laboratory present to draw blood.   Therapy Documentation Precautions:  Precautions Precautions: Fall Precaution Comments: orthostatic hypotension, B LE ACE wraps, Abdominal binder as approriate Restrictions Weight Bearing Restrictions: Yes LUE Weight Bearing: Weight bearing as tolerated  Therapy/Group: Individual Therapy Alfonse Alpers PT, DPT   12/27/2020, 7:30 AM

## 2020-12-27 NOTE — Progress Notes (Signed)
Occupational Therapy Session Note  Patient Details  Name: Kayla Harrison MRN: [id number] Date of Birth: 03-20-1946  Today's Date: 12/27/2020 OT Individual Time: 3568-6168 OT Individual Time Calculation (min): 54 min    Short Term Goals: Week 1:  OT Short Term Goal 1 (Week 1): Pt will complete toilet transfer with 1 assist and LRAD OT Short Term Goal 2 (Week 1): Pt will complete LB dressing at sit<stand level to increase standing tolerance OT Short Term Goal 3 (Week 1): Pt will complete 1 grooming task while sitting at the sink to increase OOB tolerance  Skilled Therapeutic Interventions/Progress Updates:    Treatment session with focus on self-care retraining, functional transfers, and activity tolerance.  Pt received upright in recliner expressing desire to bathe and put on clean gown.  Engaged in discussion regarding BLE wrapping for lymphedema as well as hypotension, pt expressing desire to leave legs open to air at this time due to discomfort and minimal sleep overnight d/t lymphedema wrapping.  Therapist set up pt to engage in bathing from recliner.  Pt completed UB bathing with supervision/setup.  Pt then reports need to toilet.  Ambulated 5-8' with RW to bariatric BSC with RW.  Pt continent of bowel and bladder on toilet.  Pt attempted to complete hygiene by leaning forward on commode, ultimately requiring assistance for thoroughness.  BP assessed in sitting on BSC 110/56 and 80/44 after standing attempt (pt unable to stand for entire BP reading).   Pt reports lightheadedness and weakness.  Engaged in prolonged rest break.  Pt ambulated 5' back to bed with RW with close supervision - CGA.  Pt returned to supine with CGA, reports headache and "spinning" - RN notified.  Pt requested to remain in supine and rest with lights off as she reports fatigued from minimal sleep overnight.  Therapy Documentation Precautions:  Precautions Precautions: Fall Precaution Comments: orthostatic  hypotension, B LE ACE wraps, Abdominal binder as approriate Restrictions Weight Bearing Restrictions: Yes LUE Weight Bearing: Weight bearing as tolerated General: General OT Amount of Missed Time: 6 Minutes Pain:  Pt with no c/o pain   Therapy/Group: Individual Therapy  Simonne Come 12/27/2020, 12:20 PM

## 2020-12-28 MED ORDER — FERROUS SULFATE 325 (65 FE) MG PO TABS
325.0000 mg | ORAL_TABLET | Freq: Every day | ORAL | Status: DC
  Administered 2020-12-29 – 2021-01-09 (×12): 325 mg via ORAL
  Filled 2020-12-28 (×12): qty 1

## 2020-12-28 MED ORDER — OXYCODONE HCL 5 MG PO TABS
5.0000 mg | ORAL_TABLET | ORAL | Status: DC | PRN
  Administered 2020-12-28 (×2): 5 mg via ORAL
  Filled 2020-12-28 (×2): qty 1

## 2020-12-28 NOTE — Progress Notes (Signed)
Physical Therapy Session Note  Patient Details  Name: Kayla Harrison MRN: [id number] Date of Birth: 1946/02/18  Today's Date: 12/28/2020 PT Individual Time: 1000-1054 PT Individual Time Calculation (min): 54 min   Short Term Goals: Week 1:  PT Short Term Goal 1 (Week 1): Patient will perform bed mobility with supervision consistently. PT Short Term Goal 2 (Week 1): Patient will perform basic transfers with CGA using LRAD. PT Short Term Goal 3 (Week 1): Patient will perform standing tolerace without symptoms >4 min. PT Short Term Goal 4 (Week 1): Patient will ambulate >20 ft using LRAD with min A.  Skilled Therapeutic Interventions/Progress Updates:   Received pt sitting in recliner, pt agreeable to therapy, and reported mild "throbbing" in L hip (premedicated) but did not state pain level. Session with emphasis on functional mobility/transfers, generalized strengthening, dynamic standing balance/coordination, gait training, simulated car transfers, and improved activity tolerance. Donned abdominal binder with max A.  Orthostatics: -Sitting: 117/62 -Standing: 86/55 -Standing after 3 minutes: 79/56; returned to sitting and pt reported increasing headache requiring extensive seated rest break. Sit<>stand with RW and CGA throughout session and pt ambulated 49ft with RW and CGA to WC and donned regular socks and shoes with supervision and increased time. Pt transported to therapy gym in [facility] total A for time management purposes and ambulated 47ft x 2 trials with RW and CGA. Pt required min cues for RW safety and denied any dizziness during remainder of session. Pt ambulated additional 56ft x 2 trials to/from car and performed simulated car transfer with RW and CGA requiring BUE use to get legs into car due to "heaviness". Pt transported back to room in [facility] total A and reported increased L hip pain to 8/10 at end of session. RN notified and present to administer medication. Concluded session with pt  sitting in [facility] with all needs within reach awaiting next PT session.   Therapy Documentation Precautions:  Precautions Precautions: Fall Precaution Comments: orthostatic hypotension, B LE ACE wraps, Abdominal binder as approriate Restrictions Weight Bearing Restrictions: Yes LUE Weight Bearing: Weight bearing as tolerated  Therapy/Group: Individual Therapy Alfonse Alpers PT, DPT   12/28/2020, 7:27 AM

## 2020-12-28 NOTE — Progress Notes (Signed)
Physical Therapy Session Note  Patient Details  Name: Kayla Harrison MRN: [id number] Date of Birth: [birth year]  Today's Date: 12/28/2020 PT Individual Time: 1110-1210 PT Individual Time Calculation (min): 60 min   Short Term Goals: Week 1:  PT Short Term Goal 1 (Week 1): Patient will perform bed mobility with supervision consistently. PT Short Term Goal 2 (Week 1): Patient will perform basic transfers with CGA using LRAD. PT Short Term Goal 3 (Week 1): Patient will perform standing tolerace without symptoms >4 min. PT Short Term Goal 4 (Week 1): Patient will ambulate >20 ft using LRAD with min A.  Skilled Therapeutic Interventions/Progress Updates:    Patient in w/c in room and reports pain at hip due to w/c pressing on her.  Raised armrest to remove pressure and pt reported feeling better.  Wearing abdominal binder throughout session, but declined ace wraps though encouraged for BP management.  Assisted in w/c to therapy gym total A for time management and energy conservation.  Patient sit to stand with S to RW and ambulated 94' with RW and CGA with w/c follow for safety.  Denied symptoms of orthostatic hypotension.  Patient performed standing hip flexion x 10, then did report symptoms.  BP seated 921 systolic, standing 96 systolic.  Patient ambulated with RW x 60' toward ortho gym (limited by symptoms.)  Assisted in w/c to ortho gym.  Standing balance/endurance tapping letters "A-Z" on BITS with 1 UE support x 3 trials.  Fatigued but not feeling symptomatic with standing activities 1:14 initially, then 1:06 last trial.  Patient assisted in w/c to room.  Toileted on [facility] pt managing clothing with CGA.  Patient assisted to recliner and left with legs elevated with call bell/needs in reach.  Reinforced need to call for assistance, not to get to bathroom on her own suggesting she times her toileting to avoid urgency or accidents.    Therapy Documentation Precautions:  Precautions Precautions:  Fall Precaution Comments: orthostatic hypotension, B LE ACE wraps, Abdominal binder as approriate Restrictions Weight Bearing Restrictions: Yes LUE Weight Bearing: Weight bearing as tolerated Pain: Pain Assessment Pain Scale: 0-10 Pain Score: 4  Faces Pain Scale: Hurts little more Pain Type: Acute pain;Surgical pain Pain Location: Hip Pain Orientation: Left Pain Descriptors / Indicators: Sore Pain Frequency: Intermittent Pain Onset: On-going Patients Stated Pain Goal: 3 Pain Intervention(s): Repositioned;Cold applied   Therapy/Group: Individual Therapy  Reginia Naas  [place], PT 12/28/2020, 5:07 PM

## 2020-12-28 NOTE — Plan of Care (Signed)
  Problem: Consults Goal: Skin Care Protocol Initiated - if Braden Score 18 or less Description: If consults are not indicated, leave blank or document N/A Outcome: Progressing   Problem: RH BOWEL ELIMINATION Goal: RH STG MANAGE BOWEL WITH ASSISTANCE Description: STG Manage Bowel with mod I Assistance. Outcome: Progressing   Problem: RH BLADDER ELIMINATION Goal: RH STG MANAGE BLADDER WITH ASSISTANCE Description: STG Manage Bladder With mod I Assistance Outcome: Progressing   Problem: RH SKIN INTEGRITY Goal: RH STG MAINTAIN SKIN INTEGRITY WITH ASSISTANCE Description: STG Maintain Skin Integrity With MOD I Assistance. Outcome: Progressing   Problem: RH SAFETY Goal: RH STG ADHERE TO SAFETY PRECAUTIONS W/ASSISTANCE/DEVICE Description: STG Adhere to Safety Precautions With cues and reminders Outcome: Progressing   Problem: RH PAIN MANAGEMENT Goal: RH STG PAIN MANAGED AT OR BELOW PT'S PAIN GOAL Description: Pain level less than 4 on scale of 0-10 Outcome: Progressing   Problem: Consults Goal: RH GENERAL PATIENT EDUCATION Description: See Patient Education module for education specifics. Outcome: Progressing

## 2020-12-28 NOTE — Progress Notes (Signed)
PROGRESS NOTE   Subjective/Complaints: Slept better She was able to stand for 2.59minutes this morning before feeling dizzy and needing to sit down- she is pleased with this improvement!   ROS:  Pt denies SOB, abd pain, CP, N/V/C/D, and vision changes, + pain in left hip. + right upper extremity hematoma, +orthostatic hypotension  Objective:   DG Foot Complete Left  Result Date: 12/26/2020 CLINICAL DATA:  75 year old female with pain over the base of the fifth metatarsal. EXAM: LEFT FOOT - COMPLETE 3+ VIEW COMPARISON:  None. FINDINGS: A faint linear lucency through the base of the fifth metatarsal is likely artifactual or represent vascular groove. A nondisplaced fracture is not excluded. Evaluation for fracture is limited due to advanced osteopenia. No other fracture identified. There is no dislocation. The soft tissues are grossly unremarkable. IMPRESSION: Artifact versus less likely a nondisplaced fracture of the base of the fifth metatarsal. Electronically Signed   By: Anner Crete M.D.   On: 12/26/2020 16:53   VAS [place] UPPER EXTREMITY VENOUS DUPLEX  Result Date: 12/27/2020 UPPER VENOUS STUDY  Indications: hematoma Risk Factors: Cancer multiple types of cancer. Comparison Study: No previous upper ext venous Performing Technologist: Vonzell Schlatter RVT  Examination Guidelines: A complete evaluation includes B-mode imaging, spectral Doppler, color Doppler, and power Doppler as needed of all accessible portions of each vessel. Bilateral testing is considered an integral part of a complete examination. Limited examinations for reoccurring indications may be performed as noted.  Right Findings: +----------+------------+---------+-----------+----------+-------+ RIGHT     CompressiblePhasicitySpontaneousPropertiesSummary +----------+------------+---------+-----------+----------+-------+ IJV           Full       Yes       Yes                       +----------+------------+---------+-----------+----------+-------+ Subclavian               Yes       Yes                      +----------+------------+---------+-----------+----------+-------+ Axillary      Full       Yes       Yes                      +----------+------------+---------+-----------+----------+-------+ Brachial      Full       Yes       Yes                      +----------+------------+---------+-----------+----------+-------+ Radial        Full                                          +----------+------------+---------+-----------+----------+-------+ Ulnar         Full                                          +----------+------------+---------+-----------+----------+-------+  Cephalic      Full                                          +----------+------------+---------+-----------+----------+-------+ Basilic       Full                                          +----------+------------+---------+-----------+----------+-------+  Summary:  Right: No evidence of deep vein thrombosis in the upper extremity. No evidence of superficial vein thrombosis in the upper extremity.  *See table(s) above for measurements and observations.  Diagnosing physician: Harold Barban MD Electronically signed by Harold Barban MD on 12/27/2020 at 9:59:40 PM.    Final    [place] EKG SITE RITE  Result Date: 12/26/2020 If Site Rite image not attached, placement could not be confirmed due to current cardiac rhythm.  Recent Labs    12/27/20 1525  WBC 9.6  HGB 9.3*  HCT 30.5*  PLT 335   Recent Labs    12/27/20 1525  NA 138  K 4.2  CL 105  CO2 29  GLUCOSE 119*  BUN 20  CREATININE 0.92  CALCIUM 8.3*    Intake/Output Summary (Last 24 hours) at 12/28/2020 0847 Last data filed at 12/28/2020 0840 Gross per 24 hour  Intake 717 ml  Output --  Net 717 ml        Physical Exam: Vital Signs Blood pressure (!) 117/94, pulse 71, temperature 98.2 F (36.8 C),  resp. rate 18, height 5\' 6"  (1.676 m), weight 96.3 kg, SpO2 99 %. Gen: no distress, normal appearing HEENT: oral mucosa pink and moist, NCAT Cardio: Reg rate Chest: normal effort, normal rate of breathing Abd: soft, non-distended Ext: no edema Psych: pleasant, normal affect Musculoskeletal:  General: Swellingand deformitypresent.  Cervical back: Normal range of motion.  Right lower leg: Edemapresent.  Left lower leg: Edemapresent.  Skin: Findings: Bruisingand erythemapresent. Also has a large purple hematoma on R elbow and L hip very bruised/purple/swollen- expanding The swelling of her hematoma on her right elbow has worsened. Resorbing her left hip incision the swelling is better she does have a long thin blister lateral to the incision the erythema is about 50% improved-from the picture below.   MS:  Cranial nerve exam unremarkable. RUE 5/5 prox to distal. LUE 4-/5 with limitations at elbow d/t pain. LUE 2/5 prox to 4/5 distally d/t pain. RLE 3+ prox to 4+/5 distally. No focal sensory findings.    Assessment/Plan: 1. Functional deficits which require 3+ hours per day of interdisciplinary therapy in a comprehensive inpatient rehab setting.  Physiatrist is providing close team supervision and 24 hour management of active medical problems listed below.  Physiatrist and rehab team continue to assess barriers to discharge/monitor patient progress toward functional and medical goals  Care Tool:  Bathing    Body parts bathed by patient: Right arm,Left arm,Chest,Abdomen,Face   Body parts bathed by helper: Front perineal area,Buttocks     Bathing assist Assist Level: Minimal Assistance - Patient > 75%     Upper Body Dressing/Undressing Upper body dressing   What is the patient wearing?: Dress    Upper body assist Assist Level: Moderate Assistance - Patient 50 - 74%    Lower Body Dressing/Undressing Lower body dressing      What is  the patient  wearing?: Incontinence brief     Lower body assist Assist for lower body dressing: Dependent - Patient 0%     Toileting Toileting    Toileting assist Assist for toileting: Moderate Assistance - Patient 50 - 74%     Transfers Chair/bed transfer  Transfers assist  Chair/bed transfer activity did not occur: Safety/medical concerns (orthostatic on eval)  Chair/bed transfer assist level: Minimal Assistance - Patient > 75%     Locomotion Ambulation   Ambulation assist   Ambulation activity did not occur: Safety/medical concerns (orthostatic on eval)  Assist level: Contact Guard/Touching assist Assistive device: Walker-rolling Max distance: 65ft   Walk 10 feet activity   Assist  Walk 10 feet activity did not occur: Safety/medical concerns  Assist level: Contact Guard/Touching assist Assistive device: Walker-rolling   Walk 50 feet activity   Assist Walk 50 feet with 2 turns activity did not occur: Safety/medical concerns         Walk 150 feet activity   Assist Walk 150 feet activity did not occur: Safety/medical concerns         Walk 10 feet on uneven surface  activity   Assist Walk 10 feet on uneven surfaces activity did not occur: Safety/medical concerns         Wheelchair     Assist Will patient use wheelchair at discharge?: No (Per PT long term goals)   Wheelchair activity did not occur: Safety/medical concerns (orthostatic on eval)         Wheelchair 50 feet with 2 turns activity    Assist    Wheelchair 50 feet with 2 turns activity did not occur: Safety/medical concerns       Wheelchair 150 feet activity     Assist  Wheelchair 150 feet activity did not occur: Safety/medical concerns       Blood pressure (!) 117/94, pulse 71, temperature 98.2 F (36.8 C), resp. rate 18, height 5\' 6"  (1.676 m), weight 96.3 kg, SpO2 99 %.  Medical Problem List and Plan: 1.Functional deficitssecondary to left coronoid process  fracture and left hip labral and hamstring tendon tears with associated large hematoma which required I&D, vac -patient maynot yetshower- due to orthostatic hypotension  Continue CIR -ELOS/Goals: 14 days, mod I to supervision with PT and OT 2. Antithrombotics: -DVT/anticoagulation:Pharmaceutical:Transitioned to Eliquis.  -antiplatelet therapy: N/A 3. Pain Management:Decrease oxycodone to 5mg  q3H prn.   Continue Tylenol 650mg  TID.  4. Mood:LCSW to follow for evaluation and support. -antipsychotic agents:  5. Neuropsych: This patientiscapable of making decisions on herown behalf. 6. Skin/Wound Care:Will monitor wound for healing. Add protein supplement to promote wound healing. -VAC removed from wound yesterday and wound closed by ortho -continue local dressing -ACE wrap removed as it creating a tourniquet effect on thigh, skin almost broken down in fact  -advised to wear compression garments as tolerated but they are tight for her and she can remove if too painful.   4/5- also has severe R elbow hematoma- agreeable to PICC if she requires further blood draws. Advised that reason for blood draws is to monitor Hgb. Ordered PICC. Will order Hgb once placed. Given expansion of hematoma, ordered right upper extremity ultrasound.  7. Fluids/Electrolytes/Nutrition:Monitor I/O. Lytes have been stable, repeat PRN.  8. CAF: Monitor HR tid--continue amiodarone 9. Hypothyroid: On supplement 10. Orthostatic hypotension: Will continue to monitor orthostatic vitals  --Florinef increased to 0.2 mg bid on 04/01 -blood transfusion today, may need more than one unit -difficult pt to apply TEDS/abd  binder to given wounds and body habitus. -encourage PO intake  4/2- per note today Florinef increased, but was yesterday- will monitor- hopefully  will improve with more pRBCs today.  I got 2 of PT today-  4/4: discussed with cards PA patient wants PICC if future blood draws required.   4/6: slat tabs started 11. H/o CVA with residual mild aphasia: On Crestor and Eliquis 12. OIC: Has not had BM since admission --augment bowel regimen  4/2- will give Mg citrate- had at least 2 BMs today- will monitor- also was manually disimimpacted- will also increase bowel meds  4/3- had many BMs- feeling much better- con't bowel meds 13. Acute blood loss anemia:   Hgb up to 9.3- iron supplement started.  14. Pre-renal azotemia: BUN up to 25-->push pofluid intake  4/2- same- will push fluids  4/3- BUN down to 22- con't to push fluids PO 15. Leucocytosis: WBC has varied since surgery. Will monitor for fevers and other signs of infection.  --recheck white count in am.   4/2- stable- will monitor for infection.   4/3- down to 10k- con't to monitor 16. Left foot pain: ordered XR to assess for fracture given her fall. 17. Lymphedema: continue garments as much as tolerated; may remove if painful.  17. Disposition: d/c on 4/19. F/u with me on 01/18/21 to arrive in office at 9:20 for 9:40        LOS: 6 days A FACE TO [place] 12/28/2020, 8:47 AM

## 2020-12-28 NOTE — Progress Notes (Signed)
Occupational Therapy Session Note  Patient Details  Name: Kayla Harrison MRN: [id number] Date of Birth: [birth year]  Today's Date: 12/28/2020 OT Individual Time: 0759-0900 OT Individual Time Calculation (min): 61 min    Short Term Goals: Week 1:  OT Short Term Goal 1 (Week 1): Pt will complete toilet transfer with 1 assist and LRAD OT Short Term Goal 2 (Week 1): Pt will complete LB dressing at sit<stand level to increase standing tolerance OT Short Term Goal 3 (Week 1): Pt will complete 1 grooming task while sitting at the sink to increase OOB tolerance  Skilled Therapeutic Interventions/Progress Updates:    Pt received seated in recliner, agreeable to therapy. Initially c/o nausea and headache, however reports having resolved post BM. Donned abdominal binder total A, per RN pt will req smaller binder, to f/u with nursing secretary to order a new one. STS and amb to [facility] with min hand-held A, total A for LB clothing management. Cont void of bowel, pt req total A for posterior pericare. Seated BP read at 92/61 (72), stood for 2.5 min in attempt to obtain standing orthostatics, however, BP did not read before pt req seated rest break. Stand-pivot to w/c with RW and CGA. Seated at sink, bathed full-body with min A to bathe buttocks. Completed UBD close S, donned pants with CGA for STS and max A to don new brief. Pt had 2 episodes on incont bladder, able to clean anterior peri area. Attempted to obtain orthotstatics again, seated read at 104/64, standing for 1/5 min read at 72.44. Stand-pivot transfer to recliner with CGA.   Pt left in recliner with BLE elevated, chair alarm engaged, call bell in reach, MD present and all immediate needs met.    Therapy Documentation Precautions:  Precautions Precautions: Fall Precaution Comments: orthostatic hypotension, B LE ACE wraps, Abdominal binder as approriate Restrictions Weight Bearing Restrictions: Yes LUE Weight Bearing: Weight bearing as  tolerated Pain: See above   ADL: See Care Tool for more details.  Therapy/Group: Individual Therapy  Volanda Napoleon MS, OTR/L  12/28/2020, 6:41 AM

## 2020-12-28 NOTE — Progress Notes (Signed)
Pt declined scheduled CBC this am.

## 2020-12-28 NOTE — Progress Notes (Signed)
Electrophysiology Rounding Note  Patient Name: Kayla Harrison Date of Encounter: 12/28/2020  Primary Cardiologist: [name], MD  Subjective  Slept better  Stood this am without dizziness as she transferred to chair  No edema     Orthostatics the other day importantly demonstrated an increase HR with falling BP , reflecting an intact autonomic reflex   Inpatient Medications    Scheduled Meds: . acetaminophen  650 mg Oral TID  . amiodarone  200 mg Oral Daily  . apixaban  5 mg Oral BID  . fludrocortisone  0.2 mg Oral BID WC  . levothyroxine  137 mcg Oral Once per day on Sun Mon Tue Wed Thu Fri  . levothyroxine  68.5 mcg Oral Once per day on Sat  . lidocaine  2 patch Transdermal Q24H  . multivitamin with minerals  1 tablet Oral Daily  . rosuvastatin  10 mg Oral Once per day on Mon Thu  . senna  2 tablet Oral BID  . sodium chloride  0.5 g Oral BID WC   Continuous Infusions:  PRN Meds: alum & mag hydroxide-simeth, bisacodyl, diphenhydrAMINE, guaiFENesin-dextromethorphan, oxyCODONE, polyethylene glycol, prochlorperazine **OR** prochlorperazine **OR** prochlorperazine, sodium phosphate, traZODone   Vital Signs    Vitals:   12/27/20 0503 12/27/20 1555 12/27/20 1932 12/28/20 0600  BP: (!) 118/52 113/60 (!) 115/56 (!) 117/94  Pulse: 70 69 68 71  Resp: 16 17 18 18   Temp: 98.2 F (36.8 C) 98.3 F (36.8 C) 98.3 F (36.8 C) 98.2 F (36.8 C)  TempSrc:  Oral    SpO2: 95% 99% 100% 99%  Weight:      Height:        Intake/Output Summary (Last 24 hours) at 12/28/2020 0735 Last data filed at 12/27/2020 1900 Gross per 24 hour  Intake 595 ml  Output --  Net 595 ml   Filed Weights   12/25/20 1835  Weight: 96.3 kg    Physical Exam  Well developed and nourished in no acute distress HENT normal A & Oriented  Grossly normal sensory and motor function       Labs    CBC Recent Labs    12/27/20 1525  WBC 9.6  NEUTROABS 7.5  HGB 9.3*  HCT 30.5*  MCV 98.4  PLT  884   Basic Metabolic Panel Recent Labs    12/27/20 1525  NA 138  K 4.2  CL 105  CO2 29  GLUCOSE 119*  BUN 20  CREATININE 0.92  CALCIUM 8.3*   Liver Function Tests No results for input(s): AST, ALT, ALKPHOS, BILITOT, PROT, ALBUMIN in the last 72 hours. No results for input(s): LIPASE, AMYLASE in the last 72 hours. Cardiac Enzymes No results for input(s): CKTOTAL, CKMB, CKMBINDEX, TROPONINI in the last 72 hours.   Telemetry    Not connected  Radiology    DG Foot Complete Left  Result Date: 12/26/2020 CLINICAL DATA:  75 year old female with pain over the base of the fifth metatarsal. EXAM: LEFT FOOT - COMPLETE 3+ VIEW COMPARISON:  None. FINDINGS: A faint linear lucency through the base of the fifth metatarsal is likely artifactual or represent vascular groove. A nondisplaced fracture is not excluded. Evaluation for fracture is limited due to advanced osteopenia. No other fracture identified. There is no dislocation. The soft tissues are grossly unremarkable. IMPRESSION: Artifact versus less likely a nondisplaced fracture of the base of the fifth metatarsal. Electronically Signed   By: Anner Crete M.D.   On: 12/26/2020 16:53   VAS  [place] UPPER EXTREMITY VENOUS DUPLEX  Result Date: 12/27/2020 UPPER VENOUS STUDY  Indications: hematoma Risk Factors: Cancer multiple types of cancer. Comparison Study: No previous upper ext venous Performing Technologist: Vonzell Schlatter RVT  Examination Guidelines: A complete evaluation includes B-mode imaging, spectral Doppler, color Doppler, and power Doppler as needed of all accessible portions of each vessel. Bilateral testing is considered an integral part of a complete examination. Limited examinations for reoccurring indications may be performed as noted.  Right Findings: +----------+------------+---------+-----------+----------+-------+ RIGHT     CompressiblePhasicitySpontaneousPropertiesSummary  +----------+------------+---------+-----------+----------+-------+ IJV           Full       Yes       Yes                      +----------+------------+---------+-----------+----------+-------+ Subclavian               Yes       Yes                      +----------+------------+---------+-----------+----------+-------+ Axillary      Full       Yes       Yes                      +----------+------------+---------+-----------+----------+-------+ Brachial      Full       Yes       Yes                      +----------+------------+---------+-----------+----------+-------+ Radial        Full                                          +----------+------------+---------+-----------+----------+-------+ Ulnar         Full                                          +----------+------------+---------+-----------+----------+-------+ Cephalic      Full                                          +----------+------------+---------+-----------+----------+-------+ Basilic       Full                                          +----------+------------+---------+-----------+----------+-------+  Summary:  Right: No evidence of deep vein thrombosis in the upper extremity. No evidence of superficial vein thrombosis in the upper extremity.  *See table(s) above for measurements and observations.  Diagnosing physician: Harold Barban MD Electronically signed by Harold Barban MD on 12/27/2020 at 9:59:40 PM.    Final    [place] EKG SITE RITE  Result Date: 12/26/2020 If Site Rite image not attached, placement could not be confirmed due to current cardiac rhythm.   Patient Profile     Admitted with a fall fracture of her elbow, ribs. Also bruised hip Orthostatic vital signs showed profound decrease in blood pressure 110--80 going from lying to sitting.  Assessment & Plan    Atrial fibrillation-persistent  Amiodarone therapy-longstanding  Sinus node dysfunction status post  pacemaker  Orthostatic hypotension-profound  Falls-recurrent  Anemia  Hip hematoma    Will cut the abdomnal binder to 6 inches  Continue to mobilize  Continue fludrocortisone and salt for now  Does she need IRON replacement

## 2020-12-29 MED ORDER — OXYCODONE HCL 5 MG PO TABS
10.0000 mg | ORAL_TABLET | ORAL | Status: DC | PRN
Start: 2020-12-29 — End: 2021-01-01

## 2020-12-29 MED ORDER — MELATONIN 3 MG PO TABS
3.0000 mg | ORAL_TABLET | Freq: Every day | ORAL | Status: DC
  Administered 2020-12-29 – 2021-01-04 (×7): 3 mg via ORAL
  Filled 2020-12-29 (×7): qty 1

## 2020-12-29 MED ORDER — ZINC SULFATE 220 (50 ZN) MG PO CAPS
220.0000 mg | ORAL_CAPSULE | Freq: Every day | ORAL | Status: DC
  Administered 2020-12-29 – 2021-01-04 (×7): 220 mg via ORAL
  Filled 2020-12-29 (×8): qty 1

## 2020-12-29 NOTE — Progress Notes (Signed)
Physical Therapy Session Note  Patient Details  Name: Kayla Harrison MRN: [id number] Date of Birth: April 02, 1946  Today's Date: 12/29/2020 PT Individual Time: 1440-1605 PT Individual Time Calculation (min): 85 min   Short Term Goals: Week 1:  PT Short Term Goal 1 (Week 1): Patient will perform bed mobility with supervision consistently. PT Short Term Goal 2 (Week 1): Patient will perform basic transfers with CGA using LRAD. PT Short Term Goal 3 (Week 1): Patient will perform standing tolerace without symptoms >4 min. PT Short Term Goal 4 (Week 1): Patient will ambulate >20 ft using LRAD with min A.  Skilled Therapeutic Interventions/Progress Updates:    Patient in recliner with visitor.  Reports walked further this morning than she has and felt well with BP managed better. Patient sit to stand to RW with S.  Ambulated x ~100' with CGA and w/c close follow for safety.  Pushed in w/c to 4W gym.  Patient in parallel bars performed side stepping, forward marching, forward step up on 4" step and side steps up on 4" step, then stepping forward then over step all with min A.  Patient propelled w/c x 37' with S to min A and cues for UE strength.  Patient in ADL apartment standing at sink to place and retrieve plastic cups from first shelf, then second shelf.  C/o dizzy more when looking up.  Vertebral artery test negative for symptoms in sitting.  Patient discussed issues related to her bed at home and how to modify to allow improved ability to access including low long step, leg lifter, having friend make a new base or just getting adjustable base versus getting hospital bed.  Patient appreciative of suggestions/options.  Demonstrated leg lifter and pt performed to bed in ADL apartment.  Pushed in w/c to her room and practiced on bed up close to height of her bed at home with cues and increased time.  Requesting to toilet.  Sit to stand S and with RW transferred to 3:1 managing clothing with S.  Left on 3:1  with call bell in reach and RN aware.   Therapy Documentation Precautions:  Precautions Precautions: Fall Precaution Comments: orthostatic hypotension, B LE ACE wraps, Abdominal binder as approriate Restrictions Weight Bearing Restrictions: No LUE Weight Bearing: Weight bearing as tolerated Pain: Pain Assessment Pain Score: 2  Pain Location: Hip Pain Orientation: Left Pain Descriptors / Indicators: Sore Pain Intervention(s): Ambulation/increased activity;Rest   Therapy/Group: Individual Therapy  Reginia Naas  [place], PT 12/29/2020, 3:00 PM

## 2020-12-29 NOTE — Progress Notes (Signed)
Electrophysiology Rounding Note  Patient Name: Kayla Harrison Date of Encounter: 12/29/2020  Primary Cardiologist: [name], MD  Subjective   Up in chair on my arrival with abdominal binder on.   She felt better with PT/OT yesterday and today. Very excited.   Orthostatics today 117/57 lying 127/67 sitting 83/51 standing 90/75 standing at 3 minutes   Inpatient Medications    Scheduled Meds: . acetaminophen  650 mg Oral TID  . amiodarone  200 mg Oral Daily  . apixaban  5 mg Oral BID  . ferrous sulfate  325 mg Oral Q breakfast  . fludrocortisone  0.2 mg Oral BID WC  . levothyroxine  137 mcg Oral Once per day on Sun Mon Tue Wed Thu Fri  . levothyroxine  68.5 mcg Oral Once per day on Sat  . lidocaine  2 patch Transdermal Q24H  . multivitamin with minerals  1 tablet Oral Daily  . rosuvastatin  10 mg Oral Once per day on Mon Thu  . senna  2 tablet Oral BID  . sodium chloride  0.5 g Oral BID WC   Continuous Infusions:  PRN Meds: alum & mag hydroxide-simeth, bisacodyl, diphenhydrAMINE, guaiFENesin-dextromethorphan, oxyCODONE, polyethylene glycol, prochlorperazine **OR** prochlorperazine **OR** prochlorperazine, sodium phosphate, traZODone   Vital Signs    Vitals:   12/28/20 0600 12/28/20 1326 12/28/20 1911 12/29/20 0432  BP: (!) 117/94 111/66 116/64 120/60  Pulse: 71 71 72 71  Resp: 18 15 18 17   Temp: 98.2 F (36.8 C) (!) 97.4 F (36.3 C) 98.5 F (36.9 C) 97.9 F (36.6 C)  TempSrc:  Oral    SpO2: 99% 99% 100% 98%  Weight:      Height:        Intake/Output Summary (Last 24 hours) at 12/29/2020 0904 Last data filed at 12/29/2020 0728 Gross per 24 hour  Intake 720 ml  Output --  Net 720 ml   Filed Weights   12/25/20 1835  Weight: 96.3 kg    Physical Exam    GEN- The patient is well appearing, alert and oriented x 3 today.   Head- normocephalic, atraumatic Eyes-  Sclera clear, conjunctiva pink Ears- hearing intact Oropharynx- clear Neck-  supple Lungs- Clear to ausculation bilaterally, normal work of breathing Heart- Regular rate and rhythm, no murmurs, rubs or gallops GI- soft, NT, ND, + BS Extremities- no clubbing or cyanosis. No edema Skin- no rash or lesion Psych- euthymic mood, full affect Neuro- strength and sensation are intact  Labs    CBC Recent Labs    12/27/20 1525  WBC 9.6  NEUTROABS 7.5  HGB 9.3*  HCT 30.5*  MCV 98.4  PLT 884   Basic Metabolic Panel Recent Labs    12/27/20 1525  NA 138  K 4.2  CL 105  CO2 29  GLUCOSE 119*  BUN 20  CREATININE 0.92  CALCIUM 8.3*   Liver Function Tests No results for input(s): AST, ALT, ALKPHOS, BILITOT, PROT, ALBUMIN in the last 72 hours. No results for input(s): LIPASE, AMYLASE in the last 72 hours. Cardiac Enzymes No results for input(s): CKTOTAL, CKMB, CKMBINDEX, TROPONINI in the last 72 hours.   Telemetry    Not connected in CIR (personally reviewed)  Radiology    VAS [place] UPPER EXTREMITY VENOUS DUPLEX  Result Date: 12/27/2020 UPPER VENOUS STUDY  Indications: hematoma Risk Factors: Cancer multiple types of cancer. Comparison Study: No previous upper ext venous Performing Technologist: Vonzell Schlatter RVT  Examination Guidelines: A complete evaluation includes B-mode imaging,  spectral Doppler, color Doppler, and power Doppler as needed of all accessible portions of each vessel. Bilateral testing is considered an integral part of a complete examination. Limited examinations for reoccurring indications may be performed as noted.  Right Findings: +----------+------------+---------+-----------+----------+-------+ RIGHT     CompressiblePhasicitySpontaneousPropertiesSummary +----------+------------+---------+-----------+----------+-------+ IJV           Full       Yes       Yes                      +----------+------------+---------+-----------+----------+-------+ Subclavian               Yes       Yes                       +----------+------------+---------+-----------+----------+-------+ Axillary      Full       Yes       Yes                      +----------+------------+---------+-----------+----------+-------+ Brachial      Full       Yes       Yes                      +----------+------------+---------+-----------+----------+-------+ Radial        Full                                          +----------+------------+---------+-----------+----------+-------+ Ulnar         Full                                          +----------+------------+---------+-----------+----------+-------+ Cephalic      Full                                          +----------+------------+---------+-----------+----------+-------+ Basilic       Full                                          +----------+------------+---------+-----------+----------+-------+  Summary:  Right: No evidence of deep vein thrombosis in the upper extremity. No evidence of superficial vein thrombosis in the upper extremity.  *See table(s) above for measurements and observations.  Diagnosing physician: Harold Barban MD Electronically signed by Harold Barban MD on 12/27/2020 at 9:59:40 PM.    Final     Patient Profile      Admitted with a fall fracture of her elbow, ribs. Also bruised hip Orthostatic vital signs showed profound decrease in blood pressure 110--80 going from lying to sitting.  Assessment & Plan    Atrial fibrillation-persistent  Amiodarone therapy-longstanding  Sinus node dysfunction status post pacemaker  Orthostatic hypotension-profound  Falls-recurrent  Anemia Hgb 9.3. 4/6  Hip hematoma  Remains orthostatic but symptomatically improving.  Continue to mobilize   Continue fludrocortisone and salt for now  ?  If she needs iron replacement. Iron panels would likely be skewed by recent transfusion.   For questions or updates, please contact [place] Please consult  [email or web address]  for contact info under Cardiology/STEMI.  Signed, Shirley Friar, PA-C  12/29/2020, 9:04 AM

## 2020-12-29 NOTE — Progress Notes (Signed)
PROGRESS NOTE   Subjective/Complaints: Sleep overall better but still not great- discussed starting melatonin 3mg  at night and she is agreeable Hematoma improving Left hip pain a little worse- discussed that I had decreased oxy 10 to 5 given good pain control, will go back up since pain worsened.    ROS:  Pt denies SOB, abd pain, CP, N/V/C/D, and vision changes, + pain in left hip. + right upper extremity hematoma, +orthostatic hypotension, +b/l edema LE, improving  Objective:   VAS [place] UPPER EXTREMITY VENOUS DUPLEX  Result Date: 12/27/2020 UPPER VENOUS STUDY  Indications: hematoma Risk Factors: Cancer multiple types of cancer. Comparison Study: No previous upper ext venous Performing Technologist: Vonzell Schlatter RVT  Examination Guidelines: A complete evaluation includes B-mode imaging, spectral Doppler, color Doppler, and power Doppler as needed of all accessible portions of each vessel. Bilateral testing is considered an integral part of a complete examination. Limited examinations for reoccurring indications may be performed as noted.  Right Findings: +----------+------------+---------+-----------+----------+-------+ RIGHT     CompressiblePhasicitySpontaneousPropertiesSummary +----------+------------+---------+-----------+----------+-------+ IJV           Full       Yes       Yes                      +----------+------------+---------+-----------+----------+-------+ Subclavian               Yes       Yes                      +----------+------------+---------+-----------+----------+-------+ Axillary      Full       Yes       Yes                      +----------+------------+---------+-----------+----------+-------+ Brachial      Full       Yes       Yes                      +----------+------------+---------+-----------+----------+-------+ Radial        Full                                           +----------+------------+---------+-----------+----------+-------+ Ulnar         Full                                          +----------+------------+---------+-----------+----------+-------+ Cephalic      Full                                          +----------+------------+---------+-----------+----------+-------+ Basilic       Full                                          +----------+------------+---------+-----------+----------+-------+  Summary:  Right: No evidence of deep vein thrombosis in the upper extremity. No evidence of superficial vein thrombosis in the upper extremity.  *See table(s) above for measurements and observations.  Diagnosing physician: Harold Barban MD Electronically signed by Harold Barban MD on 12/27/2020 at 9:59:40 PM.    Final    Recent Labs    12/27/20 1525  WBC 9.6  HGB 9.3*  HCT 30.5*  PLT 335   Recent Labs    12/27/20 1525  NA 138  K 4.2  CL 105  CO2 29  GLUCOSE 119*  BUN 20  CREATININE 0.92  CALCIUM 8.3*    Intake/Output Summary (Last 24 hours) at 12/29/2020 1014 Last data filed at 12/29/2020 0728 Gross per 24 hour  Intake 720 ml  Output --  Net 720 ml        Physical Exam: Vital Signs Blood pressure 120/60, pulse 71, temperature 97.9 F (36.6 C), resp. rate 17, height 5\' 6"  (1.676 m), weight 96.3 kg, SpO2 98 %. Gen: no distress, normal appearing HEENT: oral mucosa pink and moist, NCAT Cardio: Reg rate Chest: normal effort, normal rate of breathing Abd: soft, non-distended Ext: no edema Psych: pleasant, normal affect Musculoskeletal:  General: Swellingand deformitypresent.  Cervical back: Normal range of motion.  Right lower leg: Edemapresent.  Left lower leg: Edemapresent.  Skin: Findings: Bruisingand erythemapresent. Also has a large purple hematoma on R elbow and L hip very bruised/purple/swollen- expanding The swelling of her hematoma on her right elbow has worsened. Resorbing her left  hip incision the swelling is better she does have a long thin blister lateral to the incision the erythema is about 50% improved-from the picture below.   MS:  Cranial nerve exam unremarkable. RUE 5/5 prox to distal. LUE 4-/5 with limitations at elbow d/t pain. LUE 2/5 prox to 4/5 distally d/t pain. RLE 3+ prox to 4+/5 distally. No focal sensory findings.    Assessment/Plan: 1. Functional deficits which require 3+ hours per day of interdisciplinary therapy in a comprehensive inpatient rehab setting.  Physiatrist is providing close team supervision and 24 hour management of active medical problems listed below.  Physiatrist and rehab team continue to assess barriers to discharge/monitor patient progress toward functional and medical goals  Care Tool:  Bathing    Body parts bathed by patient: Right arm,Left arm,Chest,Abdomen,Face,Front perineal area,Right upper leg,Left upper leg,Buttocks   Body parts bathed by helper: Buttocks     Bathing assist Assist Level: Supervision/Verbal cueing     Upper Body Dressing/Undressing Upper body dressing   What is the patient wearing?: Button up shirt    Upper body assist Assist Level: Supervision/Verbal cueing    Lower Body Dressing/Undressing Lower body dressing      What is the patient wearing?: Incontinence brief,Pants     Lower body assist Assist for lower body dressing: Contact Guard/Touching assist     Toileting Toileting    Toileting assist Assist for toileting: Moderate Assistance - Patient 50 - 74% (total posterior, S anterior)     Transfers Chair/bed transfer  Transfers assist  Chair/bed transfer activity did not occur: Safety/medical concerns (orthostatic on eval)  Chair/bed transfer assist level: Contact Guard/Touching assist     Locomotion Ambulation   Ambulation assist   Ambulation activity did not occur: Safety/medical concerns (orthostatic on eval)  Assist level: Contact Guard/Touching  assist Assistive device: Walker-rolling Max distance: 54ft   Walk 10 feet activity   Assist  Walk 10 feet activity did not occur: Safety/medical concerns  Assist level: Contact Guard/Touching assist Assistive device: Walker-rolling   Walk 50 feet activity   Assist Walk 50 feet with 2 turns activity did not occur: Safety/medical concerns         Walk 150 feet activity   Assist Walk 150 feet activity did not occur: Safety/medical concerns         Walk 10 feet on uneven surface  activity   Assist Walk 10 feet on uneven surfaces activity did not occur: Safety/medical concerns         Wheelchair     Assist Will patient use wheelchair at discharge?: No (Per PT long term goals)   Wheelchair activity did not occur: Safety/medical concerns (orthostatic on eval)         Wheelchair 50 feet with 2 turns activity    Assist    Wheelchair 50 feet with 2 turns activity did not occur: Safety/medical concerns       Wheelchair 150 feet activity     Assist  Wheelchair 150 feet activity did not occur: Safety/medical concerns       Blood pressure 120/60, pulse 71, temperature 97.9 F (36.6 C), resp. rate 17, height 5\' 6"  (1.676 m), weight 96.3 kg, SpO2 98 %.  Medical Problem List and Plan: 1.Functional deficitssecondary to left coronoid process fracture and left hip labral and hamstring tendon tears with associated large hematoma which required I&D, vac -patient maynot yetshower- due to orthostatic hypotension  Continue CIR -ELOS/Goals: 14 days, mod I to supervision with PT and OT 2. Antithrombotics: -DVT/anticoagulation:Pharmaceutical:Transitioned to Eliquis.  -antiplatelet therapy: N/A 3. Pain Management:Adjust oxycodone to 10mg  q4H prn.   Continue Tylenol 650mg  TID.  4. Mood:LCSW to follow for evaluation and support. -antipsychotic agents:  5. Neuropsych: This patientiscapable of  making decisions on herown behalf. 6. Skin/Wound Care:Will monitor wound for healing. Add protein supplement to promote wound healing. -VAC removed from wound yesterday and wound closed by ortho -continue local dressing -ACE wrap removed as it creating a tourniquet effect on thigh, skin almost broken down in fact  -advised to wear compression garments as tolerated but they are tight for her and she can remove if too painful.   4/5- also has severe R elbow hematoma- agreeable to PICC if she requires further blood draws. Advised that reason for blood draws is to monitor Hgb. Ordered PICC. Will order Hgb once placed. Given expansion of hematoma, ordered right upper extremity ultrasound.  7. Fluids/Electrolytes/Nutrition:Monitor I/O. Lytes have been stable, repeat PRN.  8. CAF: Monitor HR tid--continue amiodarone 9. Hypothyroid: On supplement 10. Orthostatic hypotension: Will continue to monitor orthostatic vitals  --Florinef increased to 0.2 mg bid on 04/01 -blood transfusion today, may need more than one unit -difficult pt to apply TEDS/abd binder to given wounds and body habitus. -encourage PO intake  4/2- per note today Florinef increased, but was yesterday- will monitor- hopefully will improve with more pRBCs today.  I got 2 of PT today-  4/4: discussed with cards PA patient wants PICC if future blood draws required.   4/6: slat tabs started 11. H/o CVA with residual mild aphasia: On Crestor and Eliquis 12. OIC: Has not had BM since admission --augment bowel regimen  4/2- will give Mg citrate- had at least 2 BMs today- will monitor- also was manually disimimpacted- will also increase bowel meds  4/3- had many BMs- feeling much better- con't bowel meds 13. Acute blood loss anemia:   Hgb up to 9.3- iron supplement started.  14. Pre-renal azotemia: BUN  up to 25-->push  pofluid intake  4/2- same- will push fluids  4/3- BUN down to 22- con't to push fluids PO 15. Leucocytosis: WBC has varied since surgery. Will monitor for fevers and other signs of infection.  --recheck white count in am.   4/2- stable- will monitor for infection.   4/3- down to 10k- con't to monitor 16. Left foot pain: ordered XR to assess for fracture given her fall. 17. Lymphedema: continue garments as much as tolerated; may remove if painful.  18. Brittle nails: Zinc supplement ordered while in hospital. Recommended 4 [place] nuts per day at home.  19. Osteopenia: discussed risks of calcium supplement- she may refuse. Discussed high calcium foods, pharmacologic options.  20. Insomnia: melatonin 3mg  HS started 21. Disposition: d/c on 4/19. F/u with me on 01/18/21 to arrive in office at 9:20 for 9:40        LOS: 7 days A FACE TO [place] 12/29/2020, 10:14 AM

## 2020-12-29 NOTE — Progress Notes (Signed)
Occupational Therapy Session Note  Patient Details  Name: Kayla Harrison MRN: [id number] Date of Birth: 1946/05/05  Today's Date: 12/29/2020 OT Individual Time: 1133-1200 OT Individual Time Calculation (min): 27 min   Short Term Goals: Week 1:  OT Short Term Goal 1 (Week 1): Pt will complete toilet transfer with 1 assist and LRAD OT Short Term Goal 2 (Week 1): Pt will complete LB dressing at sit<stand level to increase standing tolerance OT Short Term Goal 3 (Week 1): Pt will complete 1 grooming task while sitting at the sink to increase OOB tolerance  Skilled Therapeutic Interventions/Progress Updates:    Pt greeted in bed with pain manageable for tx without additional interventions. She wanted to use the [facility]. Setup for supine<sit while using the bedrail. Close supervision for ambulatory transfer to the [facility] while using RW. Supervision for toileting tasks with pt having +bladder void. She then ambulated to the sink to wash her hands before returning to EOB to doff gripper socks and don socks + sneakers. Assistance for donning abdominal binder. She wanted to work on her standing endurance, so pt ambulated down to the [employer] with close w/c follow. She needed 1 seated rest break before making it to the destination, pt stated it was due mostly to fatigue and some lightheadedness. She was then able to ambulate half the distance back to her room but needed assistance for the rest of the way. Close supervision for stand pivot<recliner. Left pt sitting up for lunch, all needs within reach and chair alarm set.   Therapy Documentation Precautions:  Precautions Precautions: Fall Precaution Comments: orthostatic hypotension, B LE ACE wraps, Abdominal binder as approriate Restrictions Weight Bearing Restrictions: No LUE Weight Bearing: Weight bearing as tolerated ADL: ADL Eating: Not assessed Grooming: Minimal assistance Where Assessed-Grooming: Edge of bed Upper Body  Bathing: Supervision/safety Where Assessed-Upper Body Bathing: Edge of bed Lower Body Bathing: Dependent (+2 assist) Where Assessed-Lower Body Bathing: Edge of bed,Bed level Upper Body Dressing: Moderate assistance Where Assessed-Upper Body Dressing: Bed level Lower Body Dressing: Dependent (+2 assist) Where Assessed-Lower Body Dressing: Edge of bed,Bed level Toileting: Dependent (+2 assist) Where Assessed-Toileting: Bed level Toilet Transfer: Not assessed Tub/Shower Transfer: Not assessed      Therapy/Group: Individual Therapy  Artemio Dobie A Aiko Belko 12/29/2020, 12:52 PM

## 2020-12-29 NOTE — Progress Notes (Signed)
Occupational Therapy Session Note  Patient Details  Name: Kayla Harrison MRN: [id number] Date of Birth: [birth year]  Today's Date: 12/29/2020 OT Individual Time: 9021-1155 OT Individual Time Calculation (min): 60 min    Short Term Goals: Week 1:  OT Short Term Goal 1 (Week 1): Pt will complete toilet transfer with 1 assist and LRAD OT Short Term Goal 2 (Week 1): Pt will complete LB dressing at sit<stand level to increase standing tolerance OT Short Term Goal 3 (Week 1): Pt will complete 1 grooming task while sitting at the sink to increase OOB tolerance  Skilled Therapeutic Interventions/Progress Updates:    Treatment session with focus on self-care retraining and functional transfers.  Pt received upright in recliner expressing desire to bath and dress this session.  Assessed vitals during session.  Pt ambulated to sink with RW with CGA.  Pt reports lightheadedness with ambulation but reports feeling "better" today and noted having "more color" in her face.  Pt engaged in bathing and dressing at sit > stand level with overall supervision when standing.  Pt able to complete oral care in standing this session, however reporting need for extended rest break at completion of oral care.  Pt tolerated standing ~2 mins at a time this session.  Pt required frequent rest breaks throughout session.  Pt ambulated back to recliner with RW with supervision.  Pt remained upright in recliner with chair alarm on, legs elevated, and all needs in reach.    BP assessed in sitting 89/56, 95/53 after 10' walk.  Towards end of session assessed BP in sitting 102/54 HR 64 and 90/56 in standing HR 69.  Therapy Documentation Precautions:  Precautions Precautions: Fall Precaution Comments: orthostatic hypotension, B LE ACE wraps, Abdominal binder as approriate Restrictions Weight Bearing Restrictions: No LUE Weight Bearing: Weight bearing as tolerated General:   Vital Signs: Therapy Vitals Temp: 97.9 F (36.6  C) Pulse Rate: 71 Resp: 17 BP: 120/60 Patient Position (if appropriate): Lying Oxygen Therapy SpO2: 98 % O2 Device: Room Air Pain:  Pt with c/o pain in L hip, not rated.  MD aware.   Therapy/Group: Individual Therapy  Simonne Come 12/29/2020, 8:32 AM

## 2020-12-30 NOTE — Progress Notes (Signed)
Occupational Therapy Session Note  Patient Details  Name: Kayla Harrison MRN: [id number] Date of Birth: Nov 20, 1945  Today's Date: 12/30/2020 OT Individual Time: 1400-1450 OT Individual Time Calculation (min): 50 min    Short Term Goals: Week 1:  OT Short Term Goal 1 (Week 1): Pt will complete toilet transfer with 1 assist and LRAD OT Short Term Goal 2 (Week 1): Pt will complete LB dressing at sit<stand level to increase standing tolerance OT Short Term Goal 3 (Week 1): Pt will complete 1 grooming task while sitting at the sink to increase OOB tolerance  Skilled Therapeutic Interventions/Progress Updates:    Pt received in bed about to take a nap but agreeable to therapy. Pt explained in detail about why she does not want ACE wraps on her legs and that her legs have been like this for 20 years. Pt eager to work on ambulation.  Worked on walking in hallway with RW with CGA with w/c follow.  Pt stated double wheeled RW was difficult to turn, provided her with regular walker to try. She also felt it was difficult to turn. She uses a rollator at home.  When one becomes available here, pt could practice with that one.   Due to hypotension, her standing tolerance was only 1.5 minutes at a time and her ambulation tolerance with 2-3 minutes at a time. Pt practiced walking numerous times.  No difficulty with donning and doffing shoes. Returned to bed with all needs met and alarm set.     Therapy Documentation Precautions:  Precautions Precautions: Fall Precaution Comments: orthostatic hypotension, B LE ACE wraps, Abdominal binder as approriate Restrictions Weight Bearing Restrictions: No LUE Weight Bearing: Weight bearing as tolerated    Vital Signs: Therapy Vitals Temp: 97.7 F (36.5 C) Temp Source: Oral Pulse Rate: 70 Resp: 17 BP: (!) 112/56 Patient Position (if appropriate): Lying Oxygen Therapy SpO2: 100 % O2 Device: Room Air Pain: Pain Assessment Pain Score: 0-No  pain    Therapy/Group: Individual Therapy  [place] 12/30/2020, 3:44 PM

## 2020-12-30 NOTE — Progress Notes (Signed)
PROGRESS NOTE   Subjective/Complaints:  Pt feels like she is getting stronger , feels like swelling in RIgh telbow has improved, freq urination but no burning no foul odor , mainly at night   ROS:  Pt denies CP, SOB, N/V/D  Objective:   No results found. Recent Labs    12/27/20 1525  WBC 9.6  HGB 9.3*  HCT 30.5*  PLT 335   Recent Labs    12/27/20 1525  NA 138  K 4.2  CL 105  CO2 29  GLUCOSE 119*  BUN 20  CREATININE 0.92  CALCIUM 8.3*    Intake/Output Summary (Last 24 hours) at 12/30/2020 0823 Last data filed at 12/30/2020 5038 Gross per 24 hour  Intake 840 ml  Output --  Net 840 ml        Physical Exam: Vital Signs Blood pressure 130/77, pulse 71, temperature 97.7 F (36.5 C), resp. rate 18, height 5\' 6"  (1.676 m), weight 96.3 kg, SpO2 100 %.  General: No acute distress Mood and affect are appropriate Heart: Regular rate and rhythm no rubs murmurs or extra sounds Lungs: Clear to auscultation, breathing unlabored, no rales or wheezes Abdomen: Positive bowel sounds, soft nontender to palpation, nondistended Extremities: No clubbing, cyanosis, or edema Skin: No evidence of breakdown, no evidence of rash   Musculoskeletal:  General: Swellingand deformitypresent.  Cervical back: Normal range of motion.  Right lower leg: Edemapresent.  Left lower leg: Edemapresent.  Skin: Findings: Bruisingand erythemapresent. Hip incision CDI 4/9 small amt serosang fluid on telfa L hip, ecchymosis is 60% less than on photo below    MS: RUE 5/5 prox to distal. LUE 4-/5 with limitations at elbow d/t pain. LUE 2/5 prox to 4/5 distally d/t pain. RLE 3+ prox to 4+/5 distally. No focal sensory findings. SIt to stand with sup    Assessment/Plan: 1. Functional deficits which require 3+ hours per day of interdisciplinary therapy in a comprehensive inpatient rehab setting.  Physiatrist is  providing close team supervision and 24 hour management of active medical problems listed below.  Physiatrist and rehab team continue to assess barriers to discharge/monitor patient progress toward functional and medical goals  Care Tool:  Bathing    Body parts bathed by patient: Right arm,Left arm,Chest,Abdomen,Face,Front perineal area,Right upper leg,Left upper leg,Buttocks   Body parts bathed by helper: Buttocks     Bathing assist Assist Level: Supervision/Verbal cueing     Upper Body Dressing/Undressing Upper body dressing   What is the patient wearing?: Button up shirt    Upper body assist Assist Level: Supervision/Verbal cueing    Lower Body Dressing/Undressing Lower body dressing      What is the patient wearing?: Incontinence brief,Pants     Lower body assist Assist for lower body dressing: Contact Guard/Touching assist     Toileting Toileting    Toileting assist Assist for toileting: Moderate Assistance - Patient 50 - 74% (total posterior, S anterior)     Transfers Chair/bed transfer  Transfers assist  Chair/bed transfer activity did not occur: Safety/medical concerns (orthostatic on eval)  Chair/bed transfer assist level: Contact Guard/Touching assist     Locomotion Ambulation   Ambulation assist   Ambulation  activity did not occur: Safety/medical concerns (orthostatic on eval)  Assist level: Contact Guard/Touching assist Assistive device: Walker-rolling Max distance: 100'   Walk 10 feet activity   Assist  Walk 10 feet activity did not occur: Safety/medical concerns  Assist level: Contact Guard/Touching assist Assistive device: Walker-rolling   Walk 50 feet activity   Assist Walk 50 feet with 2 turns activity did not occur: Safety/medical concerns  Assist level: Contact Guard/Touching assist Assistive device: Walker-rolling    Walk 150 feet activity   Assist Walk 150 feet activity did not occur: Safety/medical concerns          Walk 10 feet on uneven surface  activity   Assist Walk 10 feet on uneven surfaces activity did not occur: Safety/medical concerns         Wheelchair     Assist Will patient use wheelchair at discharge?: No (Per PT long term goals)   Wheelchair activity did not occur: Safety/medical concerns (orthostatic on eval)  Wheelchair assist level: Supervision/Verbal cueing Max wheelchair distance: 64'    Wheelchair 50 feet with 2 turns activity    Assist    Wheelchair 50 feet with 2 turns activity did not occur: Safety/medical concerns   Assist Level: Supervision/Verbal cueing   Wheelchair 150 feet activity     Assist  Wheelchair 150 feet activity did not occur: Safety/medical concerns       Blood pressure 130/77, pulse 71, temperature 97.7 F (36.5 C), resp. rate 18, height 5\' 6"  (1.676 m), weight 96.3 kg, SpO2 100 %.  Medical Problem List and Plan: 1.Functional deficitssecondary to left coronoid process fracture and left hip labral and hamstring tendon tears with associated large hematoma which required I&D, vac -patient maynot yetshower- due to orthostatic hypotension  Continue CIR -ELOS/Goals: 14 days, mod I to supervision with PT and OT 2. Antithrombotics: -DVT/anticoagulation:Pharmaceutical:Transitioned to Eliquis.  -antiplatelet therapy: N/A 3. Pain Management:Adjust oxycodone to 10mg  q4H prn.   Continue Tylenol 650mg  TID.  4. Mood:LCSW to follow for evaluation and support. -antipsychotic agents:  5. Neuropsych: This patientiscapable of making decisions on herown behalf. 6. Skin/Wound Care:Will monitor wound for healing. Add protein supplement to promote wound healing. -VAC removed from wound yesterday and wound closed by ortho -continue local dressing -ACE wrap removed as it creating a tourniquet effect on thigh, skin almost  broken down in fact  -advised to wear compression garments as tolerated but they are tight for her and she can remove if too painful.   4/5- also has severe R elbow hematoma- agreeable to PICC if she requires further blood draws. Advised that reason for blood draws is to monitor Hgb. Ordered PICC. Marland Kitchen 7. Fluids/Electrolytes/Nutrition:Monitor I/O. Lytes have been stable, repeat PRN.  8. CAF: Monitor HR tid--continue amiodarone 9. Hypothyroid: On supplement 10. Orthostatic hypotension: Will continue to monitor orthostatic vitals  --Florinef increased to 0.2 mg bid on 04/01 -blood transfusion today, may need more than one unit -difficult pt to apply TEDS/abd binder to given wounds and body habitus. -encourage PO intake  4/2- per note today Florinef increased, but was yesterday- will monitor- hopefully will improve with more pRBCs today.  I got 2 of PT today-  4/4: discussed with cards PA patient wants PICC if future blood draws required.    11. H/o CVA with residual mild aphasia: On Crestor and Eliquis 12. OIC: Has not had BM since admission --augment bowel regimen  4/2- will give Mg citrate- had at least 2 BMs today- will monitor- also was manually  disimimpacted- will also increase bowel meds  4/3- had many BMs- feeling much better- con't bowel meds 13. Acute blood loss anemia:   Hgb up to 9.3- iron supplement started.  14. Pre-renal azotemia: BUN up to 25-->push pofluid intake  4/2- same- will push fluids  4/3- BUN down to 22- con't to push fluids PO 15. Leucocytosis: WBC has varied since surgery. Will monitor for fevers and other signs of infection.  --recheck white count in am.   4/2- stable- will monitor for infection.   4/3- down to 10k- con't to monitor 16. Left foot pain: ordered XR to assess for fracture given her fall. 17. Lymphedema: continue garments as much as tolerated; may remove if painful.  18. Brittle  nails: Zinc supplement ordered while in hospital. Recommended 4 [place] nuts per day at home.  19. Osteopenia: discussed risks of calcium supplement- she may refuse. Discussed high calcium foods, pharmacologic options.  20. Insomnia: melatonin 3mg  HS started 21. Disposition: d/c on 4/19. F/u with me on 01/18/21 to arrive in office at 9:20 for 9:40        LOS: 8 days A FACE TO [place] E Zymiere Trostle 12/30/2020, 8:23 AM

## 2020-12-31 NOTE — Progress Notes (Signed)
Occupational Therapy Session Note  Patient Details  Name: Kayla Harrison MRN: [id number] Date of Birth: 13-Jan-1946  Today's Date: 12/31/2020 OT Individual Time: 0835-0900 OT Individual Time Calculation (min): 25 min    Short Term Goals: Week 1:  OT Short Term Goal 1 (Week 1): Pt will complete toilet transfer with 1 assist and LRAD OT Short Term Goal 2 (Week 1): Pt will complete LB dressing at sit<stand level to increase standing tolerance OT Short Term Goal 3 (Week 1): Pt will complete 1 grooming task while sitting at the sink to increase OOB tolerance  Skilled Therapeutic Interventions/Progress Updates:    Pt sitting up with c/o itching in her R bicep, reporting this was from a previously infiltrated IV. No redness or heat observed. Pt completed sit > stand from recliner with supervision. Abdominal binder donned. Pt completed ambulatory transfer in room to door and back with RW- BP followed 110/44 (66). Pt completed a further bout of functional mobility- 25 ft with reports of dizziness at end. BP obtained- 99/79. Last trial completed- 30 ft and BP following was 105/58. Functional mobility performed to challenge functional activity tolerance and to simulate household distances. Lengthy discussions re need for increased AT for syncope-fall risk. Suggested apple watch with new fall detection feature and pt also stated she would look into what life alert offers. Pt was left sitting up with all needs met.   Therapy Documentation Precautions:  Precautions Precautions: Fall Precaution Comments: orthostatic hypotension, B LE ACE wraps, Abdominal binder as approriate Restrictions Weight Bearing Restrictions: No LUE Weight Bearing: Weight bearing as tolerated   Therapy/Group: Individual Therapy  Curtis Sites 12/31/2020, 7:19 AM

## 2021-01-01 LAB — COMPREHENSIVE METABOLIC PANEL
ALT: 31 U/L (ref 0–44)
AST: 39 U/L (ref 15–41)
Albumin: 2.4 g/dL — ABNORMAL LOW (ref 3.5–5.0)
Alkaline Phosphatase: 81 U/L (ref 38–126)
Anion gap: 6 (ref 5–15)
BUN: 18 mg/dL (ref 8–23)
CO2: 25 mmol/L (ref 22–32)
Calcium: 8.4 mg/dL — ABNORMAL LOW (ref 8.9–10.3)
Chloride: 109 mmol/L (ref 98–111)
Creatinine, Ser: 1.06 mg/dL — ABNORMAL HIGH (ref 0.44–1.00)
GFR, Estimated: 54 mL/min — ABNORMAL LOW (ref >60)
Glucose, Bld: 155 mg/dL — ABNORMAL HIGH (ref 70–99)
Potassium: 4 mmol/L (ref 3.5–5.1)
Sodium: 140 mmol/L (ref 135–145)
Total Bilirubin: 1.3 mg/dL — ABNORMAL HIGH (ref 0.3–1.2)
Total Protein: 5.2 g/dL — ABNORMAL LOW (ref 6.5–8.1)

## 2021-01-01 LAB — CBC WITH DIFFERENTIAL/PLATELET
Abs Immature Granulocytes: 0.14 10*3/uL — ABNORMAL HIGH (ref 0.00–0.07)
Basophils Absolute: 0 10*3/uL (ref 0.0–0.1)
Basophils Relative: 0 %
Eosinophils Absolute: 0 10*3/uL (ref 0.0–0.5)
Eosinophils Relative: 0 %
HCT: 30.1 % — ABNORMAL LOW (ref 36.0–46.0)
Hemoglobin: 9.2 g/dL — ABNORMAL LOW (ref 12.0–15.0)
Immature Granulocytes: 1 %
Lymphocytes Relative: 3 %
Lymphs Abs: 0.3 10*3/uL — ABNORMAL LOW (ref 0.7–4.0)
MCH: 30.7 pg (ref 26.0–34.0)
MCHC: 30.6 g/dL (ref 30.0–36.0)
MCV: 100.3 fL — ABNORMAL HIGH (ref 80.0–100.0)
Monocytes Absolute: 0.9 10*3/uL (ref 0.1–1.0)
Monocytes Relative: 9 %
Neutro Abs: 8.9 10*3/uL — ABNORMAL HIGH (ref 1.7–7.7)
Neutrophils Relative %: 87 %
Platelets: 214 10*3/uL (ref 150–400)
RBC: 3 MIL/uL — ABNORMAL LOW (ref 3.87–5.11)
RDW: 21.1 % — ABNORMAL HIGH (ref 11.5–15.5)
WBC: 10.3 10*3/uL (ref 4.0–10.5)
nRBC: 0 % (ref 0.0–0.2)

## 2021-01-01 LAB — GLUCOSE, CAPILLARY: Glucose-Capillary: 87 mg/dL (ref 70–99)

## 2021-01-01 MED ORDER — MIDODRINE HCL 5 MG PO TABS
5.0000 mg | ORAL_TABLET | Freq: Two times a day (BID) | ORAL | Status: DC
  Administered 2021-01-01 – 2021-01-09 (×16): 5 mg via ORAL
  Filled 2021-01-01 (×16): qty 1

## 2021-01-01 MED ORDER — ACETAMINOPHEN 325 MG PO TABS
650.0000 mg | ORAL_TABLET | ORAL | Status: DC | PRN
  Administered 2021-01-03 – 2021-01-07 (×3): 650 mg via ORAL
  Filled 2021-01-01 (×3): qty 2

## 2021-01-01 MED ORDER — OXYCODONE HCL 5 MG PO TABS
10.0000 mg | ORAL_TABLET | Freq: Four times a day (QID) | ORAL | Status: DC | PRN
Start: 2021-01-01 — End: 2021-01-02

## 2021-01-01 NOTE — Progress Notes (Signed)
Occupational Therapy Weekly Progress Note  Patient Details  Name: Kayla Harrison MRN: [id number] Date of Birth: [birth year]  Beginning of progress report period: December 23, 2020 End of progress report period: January 01, 2021  Today's Date: 01/01/2021 OT Individual Time: 1761-6073 OT Individual Time Calculation (min): 75 min    Patient has met 3 of 3 short term goals.  Pt is making steady progress towards goals.  Pt is currently able to complete ambulatory transfers with RW with supervision.  Pt is completing bathing and dressing at overall supervision - setup at sit > stand, demonstrating improved standing tolerance to complete tasks with minimal reports of dizziness or lightheadedness.  Pt is able to maintain standing 1.5-2.5 mins during bathing and grooming tasks.  Pt BP is improving, however does continue to drop with increased standing and activity.  Pt continues to report decreased balance.    Patient continues to demonstrate the following deficits: muscle weakness, decreased cardiorespiratoy endurance and decreased standing balance, decreased postural control and decreased balance strategies and therefore will continue to benefit from skilled OT intervention to enhance overall performance with BADL and Reduce care partner burden.  Patient progressing toward long term goals..  Continue plan of care.  OT Short Term Goals Week 1:  OT Short Term Goal 1 (Week 1): Pt will complete toilet transfer with 1 assist and LRAD OT Short Term Goal 1 - Progress (Week 1): Met OT Short Term Goal 2 (Week 1): Pt will complete LB dressing at sit<stand level to increase standing tolerance OT Short Term Goal 2 - Progress (Week 1): Met OT Short Term Goal 3 (Week 1): Pt will complete 1 grooming task while sitting at the sink to increase OOB tolerance OT Short Term Goal 3 - Progress (Week 1): Met Week 2:  OT Short Term Goal 1 (Week 2): STTG = LTGs due to remaining LOS  Skilled Therapeutic  Interventions/Progress Updates:    Treatment session with focus on self-care retraining, functional mobility, and standing tolerance.  Pt received upright in recliner expressing desire to wash up. BP assessed in sitting 107/43 and then after ambulating 10' to sink 114/47 HR 60.  Pt engaged in bathing at sit > stand level with supervision-setup for items.  Pt demonstrating improved standing tolerance 1.5-2 mins during bathing, dressing, and oral care with reports of fatigue but no lightheadedness or dizziness.  BP assessed after standing for oral care 111/61 and HR 63.  Pt completed stand pivot transfers w/c > wide drop arm BSC with supervision.  Pt able to complete toileting tasks with supervision - setup.  Applied abdominal binder post bathing.  Ambulated back to recliner 15' with RW with supervision.  Pt ambulated 45' with RW with supervision and w/c follow then 125' after seated rest break.  Pt returned to room and transferred back to recliner with supervision with RW.  Pt remained upright in recliner with legs elevated, chair alarm on, and all needs in reach.  Therapy Documentation Precautions:  Precautions Precautions: Fall Precaution Comments: orthostatic hypotension, B LE ACE wraps, Abdominal binder as approriate Restrictions Weight Bearing Restrictions: No LUE Weight Bearing: Weight bearing as tolerated General:   Vital Signs: Therapy Vitals Temp: 98.9 F (37.2 C) Temp Source: Oral Pulse Rate: 72 Resp: 18 BP: (!) 111/53 Patient Position (if appropriate): Lying Oxygen Therapy SpO2: 99 % O2 Device: Room Air Pain:   ADL: ADL Eating: Not assessed Grooming: Minimal assistance Where Assessed-Grooming: Edge of bed Upper Body Bathing: Supervision/safety Where Assessed-Upper Body Bathing:  Edge of bed Lower Body Bathing: Dependent (+2 assist) Where Assessed-Lower Body Bathing: Edge of bed,Bed level Upper Body Dressing: Moderate assistance Where Assessed-Upper Body Dressing: Bed  level Lower Body Dressing: Dependent (+2 assist) Where Assessed-Lower Body Dressing: Edge of bed,Bed level Toileting: Dependent (+2 assist) Where Assessed-Toileting: Bed level Toilet Transfer: Not assessed Tub/Shower Transfer: Not assessed Vision   Perception    Praxis   Exercises:   Other Treatments:     Therapy/Group: Individual Therapy  Simonne Come 01/01/2021, 8:13 AM

## 2021-01-01 NOTE — Progress Notes (Signed)
PROGRESS NOTE   Subjective/Complaints: Had chills last night, felt better after drinking orange juice. No fever. Does feel exhausted today.  Hematoma continues to improve Ambulated 140 feet yesterday!!   ROS:  Pt denies CP, SOB, N/V/D  Objective:   No results found. No results for input(s): WBC, HGB, HCT, PLT in the last 72 hours. No results for input(s): NA, K, CL, CO2, GLUCOSE, BUN, CREATININE, CALCIUM in the last 72 hours.  Intake/Output Summary (Last 24 hours) at 01/01/2021 1445 Last data filed at 01/01/2021 0803 Gross per 24 hour  Intake 238 ml  Output --  Net 238 ml        Physical Exam: Vital Signs Blood pressure (!) 149/73, pulse 80, temperature 98.2 F (36.8 C), temperature source Oral, resp. rate 17, height 5\' 6"  (1.676 m), weight 96.3 kg, SpO2 98 %.  Gen: no distress, normal appearing HEENT: oral mucosa pink and moist, NCAT Cardio: Reg rate Chest: normal effort, normal rate of breathing Abd: soft, non-distended Ext: no edema Psych: pleasant, normal affect  Musculoskeletal:  General: Swellingand deformitypresent.  Cervical back: Normal range of motion.  Right lower leg: Edemapresent.  Left lower leg: Edemapresent.  Skin: Findings: Bruisingand erythemapresent. Hip incision CDI 4/9 small amt serosang fluid on telfa L hip, ecchymosis is 60% less than on photo below    MS: RUE 5/5 prox to distal. LUE 4-/5 with limitations at elbow d/t pain. LUE 2/5 prox to 4/5 distally d/t pain. RLE 3+ prox to 4+/5 distally. No focal sensory findings. SIt to stand with sup    Assessment/Plan: 1. Functional deficits which require 3+ hours per day of interdisciplinary therapy in a comprehensive inpatient rehab setting.  Physiatrist is providing close team supervision and 24 hour management of active medical problems listed below.  Physiatrist and rehab team continue to assess barriers to  discharge/monitor patient progress toward functional and medical goals  Care Tool:  Bathing    Body parts bathed by patient: Right arm,Left arm,Chest,Abdomen,Face,Front perineal area,Right upper leg,Left upper leg,Right lower leg,Left lower leg,Buttocks   Body parts bathed by helper: Buttocks     Bathing assist Assist Level: Supervision/Verbal cueing     Upper Body Dressing/Undressing Upper body dressing   What is the patient wearing?: Pull over shirt    Upper body assist Assist Level: Supervision/Verbal cueing    Lower Body Dressing/Undressing Lower body dressing      What is the patient wearing?: Incontinence brief,Pants     Lower body assist Assist for lower body dressing: Contact Guard/Touching assist     Toileting Toileting    Toileting assist Assist for toileting: Supervision/Verbal cueing     Transfers Chair/bed transfer  Transfers assist  Chair/bed transfer activity did not occur: Safety/medical concerns (orthostatic on eval)  Chair/bed transfer assist level: Supervision/Verbal cueing     Locomotion Ambulation   Ambulation assist   Ambulation activity did not occur: Safety/medical concerns (orthostatic on eval)  Assist level: Supervision/Verbal cueing Assistive device: Walker-rolling Max distance: 173ft   Walk 10 feet activity   Assist  Walk 10 feet activity did not occur: Safety/medical concerns  Assist level: Supervision/Verbal cueing Assistive device: Walker-rolling   Walk 50 feet activity  Assist Walk 50 feet with 2 turns activity did not occur: Safety/medical concerns  Assist level: Supervision/Verbal cueing Assistive device: Walker-rolling    Walk 150 feet activity   Assist Walk 150 feet activity did not occur: Safety/medical concerns         Walk 10 feet on uneven surface  activity   Assist Walk 10 feet on uneven surfaces activity did not occur: Safety/medical concerns         Wheelchair     Assist Will  patient use wheelchair at discharge?: No (Per PT long term goals)   Wheelchair activity did not occur: Safety/medical concerns (orthostatic on eval)  Wheelchair assist level: Supervision/Verbal cueing Max wheelchair distance: 81'    Wheelchair 50 feet with 2 turns activity    Assist    Wheelchair 50 feet with 2 turns activity did not occur: Safety/medical concerns   Assist Level: Supervision/Verbal cueing   Wheelchair 150 feet activity     Assist  Wheelchair 150 feet activity did not occur: Safety/medical concerns       Blood pressure (!) 149/73, pulse 80, temperature 98.2 F (36.8 C), temperature source Oral, resp. rate 17, height 5\' 6"  (1.676 m), weight 96.3 kg, SpO2 98 %.  Medical Problem List and Plan: 1.Functional deficitssecondary to left coronoid process fracture and left hip labral and hamstring tendon tears with associated large hematoma which required I&D, vac -patient maynot yetshower- due to orthostatic hypotension  Continue CIR -ELOS/Goals: 14 days, mod I to supervision with PT and OT 2. Antithrombotics: -DVT/anticoagulation:Pharmaceutical:Transitioned to Eliquis.  -antiplatelet therapy: N/A 3. Pain Management:Decrease oxycodone to 10mg  q6H prn.   Continue Tylenol 650mg  TID.  4. Mood:LCSW to follow for evaluation and support. -antipsychotic agents:  5. Neuropsych: This patientiscapable of making decisions on herown behalf. 6. Skin/Wound Care:Will monitor wound for healing. Add protein supplement to promote wound healing. -VAC removed from wound yesterday and wound closed by ortho -continue local dressing -ACE wrap removed as it creating a tourniquet effect on thigh, skin almost broken down in fact  -advised to wear compression garments as tolerated but they are tight for her and she can remove if too painful.   4/5- also has  severe R elbow hematoma- agreeable to PICC if she requires further blood draws.   4/11: requested WOC eval of left thigh wound given pain, erythema, induration.   7. Fluids/Electrolytes/Nutrition:Monitor I/O. Lytes have been stable, repeat PRN.  8. CAF: Monitor HR tid--continue amiodarone 9. Hypothyroid: On supplement 10. Orthostatic hypotension: Will continue to monitor orthostatic vitals  --Florinef increased to 0.2 mg bid on 04/01 -blood transfusion today, may need more than one unit -difficult pt to apply TEDS/abd binder to given wounds and body habitus. -encourage PO intake  4/2- per note today Florinef increased, but was yesterday- will monitor- hopefully will improve with more pRBCs today.  I got 2 of PT today-  4/4: discussed with cards PA patient wants PICC if future blood draws required.   4/11: improved 11. H/o CVA with residual mild aphasia: On Crestor and Eliquis 12. OIC: Has not had BM since admission --augment bowel regimen  4/2- will give Mg citrate- had at least 2 BMs today- will monitor- also was manually disimimpacted- will also increase bowel meds  4/3- had many BMs- feeling much better- con't bowel meds 13. Acute blood loss anemia:   Hgb up to 9.3- iron supplement started.  14. Pre-renal azotemia: BUN up to 25-->push pofluid intake  4/2- same- will push fluids  4/3- BUN  down to 22- con't to push fluids PO 15. Leucocytosis: WBC has varied since surgery. Will monitor for fevers and other signs of infection.  --recheck white count in am.   4/2- stable- will monitor for infection.   4/3- down to 10k- con't to monitor 16. Left foot pain: ordered XR to assess for fracture given her fall. 17. Lymphedema: continue garments as much as tolerated; may remove if painful.  18. Brittle nails: Zinc supplement ordered while in hospital. Recommended 4 [place] nuts per day at home.  19. Osteopenia: discussed  risks of calcium supplement- she may refuse. Discussed high calcium foods, pharmacologic options.  20. Hypothyroidism: continue synthroid 68.13mcg weekly.  21. Insomnia: melatonin 3mg  HS started 22. Disposition: d/c on 4/19. F/u with me on 01/18/21 to arrive in office at 9:20 for 9:40        LOS: 10 days A FACE TO [place] 01/01/2021, 2:45 PM

## 2021-01-01 NOTE — Progress Notes (Signed)
Physical Therapy Weekly Progress Note  Patient Details  Name: Kayla Harrison MRN: [id number] Date of Birth: 13-Feb-1946  Beginning of progress report period: December 24, 2020 End of progress report period: January 01, 2021  Today's Date: 01/01/2021 PT Individual Time: 2458-0998 PT Individual Time Calculation (min): 69 min   Patient has met 4 of 4 short term goals. Pt demonstrates steady progress towards long term goals. Pt is currently able to perform bed mobility with supervision and use of bedrails, transfers with RW and close supervision, and ambulate up to 119ft with RW and supervision. Pt demonstrates improvements in orthostatic symptoms, although BP continues to remain low.   Patient continues to demonstrate the following deficits muscle weakness, decreased cardiorespiratoy endurance and decreased standing balance, decreased postural control and decreased balance strategies and therefore will continue to benefit from skilled PT intervention to increase functional independence with mobility.  Patient progressing toward long term goals..  Continue plan of care.  PT Short Term Goals Week 1:  PT Short Term Goal 1 (Week 1): Patient will perform bed mobility with supervision consistently. PT Short Term Goal 1 - Progress (Week 1): Met PT Short Term Goal 2 (Week 1): Patient will perform basic transfers with CGA using LRAD. PT Short Term Goal 2 - Progress (Week 1): Met PT Short Term Goal 3 (Week 1): Patient will perform standing tolerace without symptoms >4 min. PT Short Term Goal 3 - Progress (Week 1): Met PT Short Term Goal 4 (Week 1): Patient will ambulate >20 ft using LRAD with min A. PT Short Term Goal 4 - Progress (Week 1): Met Week 2:  PT Short Term Goal 1 (Week 2): STG=LTG due to LOS  Skilled Therapeutic Interventions/Progress Updates:  Ambulation/gait training;Community reintegration;DME/adaptive equipment instruction;Neuromuscular re-education;Psychosocial support;Stair  training;UE/LE Strength taining/ROM;Wheelchair propulsion/positioning;UE/LE Coordination activities;Therapeutic Activities;Skin care/wound management;Functional electrical stimulation;Pain management;Discharge planning;Balance/vestibular training;Disease management/prevention;Functional mobility training;Patient/family education;Splinting/orthotics;Therapeutic Exercise   Today's Interventions: Received pt sitting in recliner with electrophysiology PA present at bedside, pt agreeable to therapy, and denied any pain during session. Session with focus on functional mobility/transfers, generalized strengthening, dynamic standing balance/coordination, toileting, ambulation, NMR, and improved activity tolerance. Abdominal binder donned during session.  Orthostatics: -Sitting: 136/77; pt asymptomatic -Standing: 111/65; pt asymptomatic Pt reported urge to urinate and ambulated 40ft with RW and supervision to commode and able to manage clothing with close supervision. Pt able to void and perform hygiene management with supervision. Pt stood at sink and washed hands with supervision. Pt transported to 4W therapy gym in [facility] total A for time management purposes and ambulated 18ft with RW and close supervision. Pt denied any orthostatic symptoms but reported fatigue and mild increase in bilateral knee pain after ambulating. Pt demonstrates wide BOS due to body habitus and bilateral LE lymphedema, decreased step length, and decreased bilateral foot clearance. Pt then performed BUE strengthening on UBE at level 1 alternating 1 minute forwards and 1 minute backwards for a total of 6 minutes with pillow behind low back for support. Worked on dynamic standing balance performing alternating toe taps to 6in step 3x10 reps using RW with min A for balance. Pt reported increased lightheadedness and required multiple rest breaks throughout session. During rest breaks, discussed pt's goal of wanting to be able to go on her cruise to  [place] but reported getting on and off a bus would be a concern as steps to get into bus are >6in. Discussed importance of practicing simulating this environment in therapy sessions. Pt transported back to room in [facility]  total A and ambulated 67f with RW and supervision to bed and transferred sit<>supine with supervision. Pt reported increased headache at end of session but declined pain medications. Demonstrated gentle temporalis massage to pt and pt reported relief. Concluded session with pt semi-reclined in bed, needs within reach, and bed alarm on.   Therapy Documentation Precautions:  Precautions Precautions: Fall Precaution Comments: orthostatic hypotension, B LE ACE wraps, Abdominal binder as approriate Restrictions Weight Bearing Restrictions: No LUE Weight Bearing: Weight bearing as tolerated  Therapy/Group: Individual Therapy AAlfonse AlpersPT, DPT   01/01/2021, 7:25 AM

## 2021-01-01 NOTE — Progress Notes (Signed)
Electrophysiology Rounding Note  Patient Name: Kayla Harrison Date of Encounter: 01/01/2021  Primary Cardiologist: [name], MD  Subjective   Up in chair on my arrival with abdominal binder on.   She felt better with PT/OT yesterday though did get a little lightheaded.  Chills last night Hematoma remains quite uncomfortable, reports MD looked at it today, wound care will assist   Inpatient Medications    Scheduled Meds: . acetaminophen  650 mg Oral TID  . amiodarone  200 mg Oral Daily  . apixaban  5 mg Oral BID  . ferrous sulfate  325 mg Oral Q breakfast  . fludrocortisone  0.2 mg Oral BID WC  . levothyroxine  137 mcg Oral Once per day on Sun Mon Tue Wed Thu Fri  . levothyroxine  68.5 mcg Oral Once per day on Sat  . lidocaine  2 patch Transdermal Q24H  . melatonin  3 mg Oral QHS  . multivitamin with minerals  1 tablet Oral Daily  . rosuvastatin  10 mg Oral Once per day on Mon Thu  . senna  2 tablet Oral BID  . sodium chloride  0.5 g Oral BID WC  . zinc sulfate  220 mg Oral Daily   Continuous Infusions:  PRN Meds: alum & mag hydroxide-simeth, bisacodyl, diphenhydrAMINE, guaiFENesin-dextromethorphan, oxyCODONE, polyethylene glycol, prochlorperazine **OR** prochlorperazine **OR** prochlorperazine, sodium phosphate, traZODone   Vital Signs    Vitals:   12/31/20 1918 12/31/20 1919 01/01/21 0100 01/01/21 0515  BP: 115/67  (!) 156/92 (!) 111/53  Pulse: 70 69 73 72  Resp:  18 19 18   Temp:  98.3 F (36.8 C) 99.4 F (37.4 C) 98.9 F (37.2 C)  TempSrc:   Oral Oral  SpO2:  99% 98% 99%  Weight:      Height:        Intake/Output Summary (Last 24 hours) at 01/01/2021 1006 Last data filed at 01/01/2021 0803 Gross per 24 hour  Intake 478 ml  Output --  Net 478 ml   Filed Weights   12/25/20 1835  Weight: 96.3 kg    Physical Exam    GEN- The patient is well appearing, alert and oriented x 3 today.   Head- normocephalic, atraumatic Eyes-  Sclera clear,  conjunctiva pink Ears- hearing intact Oropharynx- clear Neck- supple Lungs- CTA b/l Heart- RRR, 1-2/6 SM, no rubs or gallops GI- soft, NT, ND, Extremities- no clubbing or cyanosis. No edema Skin- no rash or lesion Psych- euthymic mood, full affect Neuro- strength and sensation are intact  Hematoma L hip  Labs    CBC No results for input(s): WBC, NEUTROABS, HGB, HCT, MCV, PLT in the last 72 hours. Basic Metabolic Panel No results for input(s): NA, K, CL, CO2, GLUCOSE, BUN, CREATININE, CALCIUM, MG, PHOS in the last 72 hours. Liver Function Tests No results for input(s): AST, ALT, ALKPHOS, BILITOT, PROT, ALBUMIN in the last 72 hours. No results for input(s): LIPASE, AMYLASE in the last 72 hours. Cardiac Enzymes No results for input(s): CKTOTAL, CKMB, CKMBINDEX, TROPONINI in the last 72 hours.   Telemetry    Not connected in CIR (personally reviewed)  Radiology    No results found.  Patient Profile      Admitted with a fall fracture of her elbow, ribs. Also bruised hip Orthostatic vital signs showed profound decrease in blood pressure 110--80 going from lying to sitting.  Assessment & Plan    Atrial fibrillation-persistent     CHA2DS2Vasc is 6, on Eliquis, appropriately  dosed Amiodarone therapy-longstanding Sinus node dysfunction status post pacemaker  Orthostatic hypotension-profound   Falls-recurrent  orthostatics today   111/53 (72bpm) supine   111/61 (63) sit     87/43 (74) stand     D/w Dr. Caryl Comes will continue Florinef add  Midodrine    Hip hematoma Anemia Hgb trending up last check    For questions or updates, please contact [facility] Please consult [email or web address] for contact info under Cardiology/STEMI.  Signed, [name], PA-C  01/01/2021, 10:06 AM

## 2021-01-01 NOTE — Progress Notes (Signed)
Pt noted with uncontrollable shivering, pt just checked on approximately 5 minutes early. Alert and oriented, reports that her teeth are "chattering". Pt initially denies that she is cold but then informs to staff that she is cold. Denies pain, no distress noted. Assessment unremarkable. Pt's CBG within limits, difficulty obtaining BP due to shivering, with final check-156/92, HR-73, R19, T-99.4. Pt requesting orange juice. Patient obtained a warm blanket. Pt reports that she feels better and is no longer shivering approximately 15 minutes later. Will continue to monitor.  -0430: Pt up to the bathroom x2 with staff assist. Reports that she felt much better after consuming orange juice. No shivering noted. Denies pain, no distress noted. Rested well throughout the latter part of the shift. Will continue to monitor.

## 2021-01-01 NOTE — Progress Notes (Signed)
Pt resting comfortably at this time, no tremors noted.

## 2021-01-01 NOTE — Progress Notes (Signed)
Blood draw collected for labs. Pt tolerated well.

## 2021-01-01 NOTE — Progress Notes (Signed)
Occupational Therapy Session Note  Patient Details  Name: Kayla Harrison MRN: [id number] Date of Birth: 1945-11-21  Today's Date: 01/01/2021 OT Individual Time: 1445-1510 OT Individual Time Calculation (min): 25 min  and Today's Date: 01/01/2021 OT Missed Time: 20 Minutes Missed Time Reason: Patient ill (comment) (c/o shivering, shaking, teeth chattering)   Short Term Goals: Week 2:  OT Short Term Goal 1 (Week 2): STTG = LTGs due to remaining LOS  Skilled Therapeutic Interventions/Progress Updates:    Pt received in bed stating she felt very poorly. C/o feeling cold and that she was beginning to shiver. Pt agreeable to trying to sit on EOB to work on UE exercises.  Pt sat to EOB with S and as soon as she sat up, began shaking aggressively with tremors everywhere.  Sat in front of pt stabilizing her knees and holding her hands, hoping that in time the tremors would reduce.  Pt' hands were not cold but she stated she felt very cold. Wrapped blankets around her. When tremors would not stop, called RN to room to observe and assess before moving her back to supine.  Needed to A pt with holding cup of OJ with straw as she was shaking too much. Pt moved back to supine and covered with more blankets. Stopped therapy as pt not able to participate further.    Therapy Documentation Precautions:  Precautions Precautions: Fall Precaution Comments: orthostatic hypotension, B LE ACE wraps, Abdominal binder as approriate Restrictions Weight Bearing Restrictions: No LUE Weight Bearing: Weight bearing as tolerated Therapy Vitals Temp: 98.2 F (36.8 C) Temp Source: Oral Pulse Rate: 80 Resp: 17 BP: (!) 149/73 Patient Position (if appropriate): Lying Oxygen Therapy SpO2: 98 % O2 Device: Room Air Pain: Pain Assessment Pain Scale: 0-10 Pain Score: 0-No pain   Therapy/Group: Individual Therapy  [place] 01/01/2021, 4:07 PM

## 2021-01-01 NOTE — Progress Notes (Addendum)
Called to room by therapy, pt c/o severe shaking to bilateral upper extremities, feels cold (was wrapped up with extra blankets), skin is warm to the touch. Same thing happened last night per report. Pt felt better once had had some orange juice. Had pt hold bottle filled with water and tremors still continued. Pt requested orange juice again. Notified PA. Pt is agreeable to blood work being drawn as this is something she has refused. Last labs taken 04/06

## 2021-01-02 MED ORDER — CEPHALEXIN 500 MG PO CAPS
500.0000 mg | ORAL_CAPSULE | Freq: Two times a day (BID) | ORAL | Status: DC
  Administered 2021-01-02 – 2021-01-03 (×3): 500 mg via ORAL
  Filled 2021-01-02 (×3): qty 1

## 2021-01-02 MED ORDER — OXYCODONE HCL 5 MG PO TABS
10.0000 mg | ORAL_TABLET | Freq: Three times a day (TID) | ORAL | Status: DC | PRN
Start: 2021-01-02 — End: 2021-01-05

## 2021-01-02 NOTE — Progress Notes (Signed)
Occupational Therapy Session Note  Patient Details  Name: Kayla Harrison MRN: [id number] Date of Birth: 10-Apr-1946  Today's Date: 01/02/2021 OT Individual Time: 1103-1594 OT Individual Time Calculation (min): 75 min    Short Term Goals: Week 2:  OT Short Term Goal 1 (Week 2): STTG = LTGs due to remaining LOS  Skilled Therapeutic Interventions/Progress Updates:    Treatment session with focus on self-care retraining, dynamic standing balance, and overall endurance.  Pt received semi-reclined in bed with cardiologist rounding.  Pt reports no orthostatic vitals taken this AM, therefore therapist completed (see flowsheets).  Pt completed bed mobility Mod I and when attempting to stand for orthostatic vitals pt reports need to toilet.  Pt ambulated 5' to [facility] with RW supervision. Pt had loose BM on toilet, reporting 3rd BM this morning. Pt completed toileting with setup for supplies.  Pt ambulated to sink with RW supervision.  Pt completed bathing and dressing with setup and distant supervision at sink with no complaints of lightheadedness this session.  Pt completed LB dressing with assistance to thread incontinence brief between legs.  Pt donned shoes with setup.  Pt reports sister plans to check in on her but will not be "taking care of her".  Pt ambulated 32' with RW without abdominal binder. Pt reports lightheadedness BP 107/56 HR 64 after ambulating 99' with RW with supervision.  Pt returned to room and transferred to recliner stand pivot distant supervision.  Pt remained upright in recliner with chair alarm on and all needs in reach, MD arriving for morning rounds.   Therapy Documentation Precautions:  Precautions Precautions: Fall Precaution Comments: orthostatic hypotension, B LE ACE wraps, Abdominal binder as approriate Restrictions Weight Bearing Restrictions: No LUE Weight Bearing: Weight bearing as tolerated Pain:  Pt with no c/o pain   Therapy/Group: Individual  Therapy  Simonne Come 01/02/2021, 8:45 AM

## 2021-01-02 NOTE — Patient Care Conference (Signed)
Inpatient RehabilitationTeam Conference and Plan of Care Update Date: 01/02/2021   Time: 9:42 AM    Patient Name: Kayla Harrison      Medical Record Number: [id number]  Date of Birth: 05-23-1946 Sex: Female         Room/Bed: 2K02R/4Y70W-23 Payor Info: Payor: MEDICARE / Plan: MEDICARE PART A AND B / Product Type: *No Product type* /    Admit Date/Time:  12/22/2020  4:49 PM  Primary Diagnosis:  Physical debility  Hospital Problems: Principal Problem:   Physical debility Active Problems:   Constipation   Orthostatic hypotension   Tear of left hamstring   Labral tear of left hip joint   Acute blood loss anemia    Expected Discharge Date: Expected Discharge Date: 01/09/21  Team Members Present: Physician leading conference: Dr. Leeroy Cha Care Coodinator Present: Erlene Quan, BSW;Maytte Jacot Creig Hines, RN, BSN, [place] Nurse Present: Dorthula Nettles, RN PT Present: Becky Sax, PT OT Present: Simonne Come, OT PPS Coordinator present : Gunnar Fusi, SLP     Current Status/Progress Goal Weekly Team Focus  Bowel/Bladder   continent of B/B LBM 04/12  remain continent with normal bowel pattern  toilet prn   Swallow/Nutrition/ Hydration             ADL's   Setup UB bathing and dressing, CGA LB bathing and dressing, Supervision stand pivot and short distance ambulation  Supervision overall - upgraded due to Mod I  ADL retraining, functional mobility, dynamic standing balance, energy conservation, activity tolerance   Mobility   bed mobility supervision, transfers with RW supervision, gait 123ft with RW CGA.  mod I/supervision  functional mobility/transfers, generalized strengthening, dynamic standing balance/coordination, and improved activity tolerance   Communication             Safety/Cognition/ Behavioral Observations            Pain   pt denies refused lidoderm patch d/t feels better  free of pain  assess pain qshift and prn   Skin   hematoma to left hip  flank and buttock BUE ecchymosis surgical incision with sutures to left hip with foam dressing  pt will be free of infection or breakdown  assess skin qshift and prn dressings changes as ordered and prn     Discharge Planning:  pt plans to d/c MOD I. Sisters able to assit at d/c. Primary sister potentially coming down from [place] to assist   Team Discussion: Less dizziness this week, has had 2 episodes of chills, some warmth to incision site, started antibiotics prophylactic. Still having ortho stasis, push fluids please, Hgb is doing good. Continent with incontinent episodes of bladder. Feels very tired, hematoma to the left hip with sutures and a foam dressing. Lymphedema to LLE, shakes to BUE, encouraging to drink fluids, and patient reporting that she is consuming more fluids.   Patient on target to meet rehab goals: yes, supervision for bed mobility, supervision for transfers, walked 140 ft with RW, and without the abdominal binder. Upgraded goals to mod I. Distant supervision for self care.  *See Care Plan and progress notes for long and short-term goals.   Revisions to Treatment Plan:  Not at this time.  Teaching Needs: Family education, medication management, pain management, skin/wound care, dressing changes, transfer training, gait training, balance training, endurance training.  Current Barriers to Discharge: Decreased caregiver support, Medical stability, Home enviroment access/layout, Incontinence, Wound care, Lack of/limited family support, Medication compliance and Behavior  Possible Resolutions to Barriers: Continue current medications,  provide emotional support.     Medical Summary Current Status: fatigue, lymphedema, chills, warmth to incision site, AKI, hypoalbuminemia, urine incontinence, postoperative pain  Barriers to Discharge: Medical stability;Wound care  Barriers to Discharge Comments: fatigue, lymphedema, chills, warmth to incision site, AKI, hypoalbuminemia, urine  incontinence, postoperative pain Possible Resolutions to [employer]: discussed only using trazodone as needed, wean oxycodone, encourage hydration and high protein foods, prophylactic antibiotics, requested surgery eval of her incision   Continued Need for Acute Rehabilitation Level of Care: The patient requires daily medical management by a physician with specialized training in physical medicine and rehabilitation for the following reasons: Direction of a multidisciplinary physical rehabilitation program to maximize functional independence : Yes Medical management of patient stability for increased activity during participation in an intensive rehabilitation regime.: Yes Analysis of laboratory values and/or radiology reports with any subsequent need for medication adjustment and/or medical intervention. : Yes   I attest that I was present, lead the team conference, and concur with the assessment and plan of the team.   Cristi Loron 01/02/2021, 1:48 PM

## 2021-01-02 NOTE — Progress Notes (Signed)
Physical Therapy Session Note  Patient Details  Name: Kayla Harrison MRN: [id number] Date of Birth: 06-11-1946  Today's Date: 01/02/2021 PT Individual Time: 2637-8588 PT Individual Time Calculation (min): 56 min   Short Term Goals: Week 1:  PT Short Term Goal 1 (Week 1): Patient will perform bed mobility with supervision consistently. PT Short Term Goal 1 - Progress (Week 1): Met PT Short Term Goal 2 (Week 1): Patient will perform basic transfers with CGA using LRAD. PT Short Term Goal 2 - Progress (Week 1): Met PT Short Term Goal 3 (Week 1): Patient will perform standing tolerace without symptoms >4 min. PT Short Term Goal 3 - Progress (Week 1): Met PT Short Term Goal 4 (Week 1): Patient will ambulate >20 ft using LRAD with min A. PT Short Term Goal 4 - Progress (Week 1): Met Week 2:  PT Short Term Goal 1 (Week 2): STG=LTG due to LOS  Skilled Therapeutic Interventions/Progress Updates:   Received pt sitting in recliner, pt agreeable to therapy, and denied any pain during session. Session with emphasis on functional mobility/transfers, generalized strengthening, dynamic standing balance/coordination, gait training, toileting, and improved activity tolerance. Donned shoes with set up assist and abdominal binder with max A. Orthostatics: Sitting: 131/67; pt asymptomatic Standing: 123/62; pt asymptomatic Pt ambulated 15ft with RW and supervision to [facility] and transported to 4W dayroom in [facility] total A for time management purposes. Pt transferred onto Nustep with supervision and use of RW and performed BUE/LE strengthening on workload 3 for a total of 8 minutes for 533 steps for improved cardiovascular endurance. Pt reported feeling "foggy" after 5 minutes and took 1 rest break. BP: 134/70. Pt transported to 4W dayroom and ambulated 18ft x1 and 74ft x 1 with RW and supervision. Pt transported back to room in [facility] total A and ambulated 65ft with RW and supervision to recliner. Pt attempted to stand  without UE support but was unable. Performed x3 sit<>stands with 1 UE support and supervision to fatigue and then pt reported urge to urinate. Pt ambulated 55ft x 2 trials with RW and supervision to/from commode and able to manage clothing, void, and perform peri-care with supervision. Concluded session with pt sitting in recliner, needs within reach, and chair pad alarm on. Provided pt with fresh drink.   Therapy Documentation Precautions:  Precautions Precautions: Fall Precaution Comments: orthostatic hypotension, B LE ACE wraps, Abdominal binder as approriate Restrictions Weight Bearing Restrictions: No LUE Weight Bearing: Weight bearing as tolerated  Therapy/Group: Individual Therapy Alfonse Alpers PT, DPT   01/02/2021, 7:27 AM

## 2021-01-02 NOTE — Progress Notes (Signed)
PROGRESS NOTE   Subjective/Complaints: Ambulating better Encouraged high protein diet given low albumin Team conference today Goals have been updraded  ROS:  Pt denies CP, SOB, N/V/D, dizziness improved  Objective:   No results found. Recent Labs    01/01/21 1624  WBC 10.3  HGB 9.2*  HCT 30.1*  PLT 214   Recent Labs    01/01/21 1624  NA 140  K 4.0  CL 109  CO2 25  GLUCOSE 155*  BUN 18  CREATININE 1.06*  CALCIUM 8.4*    Intake/Output Summary (Last 24 hours) at 01/02/2021 0945 Last data filed at 01/02/2021 0814 Gross per 24 hour  Intake 360 ml  Output --  Net 360 ml        Physical Exam: Vital Signs Blood pressure 114/61, pulse 70, temperature 97.7 F (36.5 C), temperature source Oral, resp. rate 18, height 5\' 6"  (1.676 m), weight 96.3 kg, SpO2 93 %. Gen: no distress, normal appearing HEENT: oral mucosa pink and moist, NCAT Cardio: Reg rate Chest: normal effort, normal rate of breathing Abd: soft, non-distended Ext: no edema Psych: pleasant, normal affect  Musculoskeletal:  General: Swellingand deformitypresent.  Cervical back: Normal range of motion.  Right lower leg: Edemapresent.  Left lower leg: Edemapresent.  Skin: Findings: Bruisingand erythemapresent. Hip incision CDI 4/9 small amt serosang fluid on telfa L hip, ecchymosis is 60% less than on photo below    MS: RUE 5/5 prox to distal. LUE 4-/5 with limitations at elbow d/t pain. LUE 2/5 prox to 4/5 distally d/t pain. RLE 3+ prox to 4+/5 distally. No focal sensory findings. SIt to stand with sup    Assessment/Plan: 1. Functional deficits which require 3+ hours per day of interdisciplinary therapy in a comprehensive inpatient rehab setting.  Physiatrist is providing close team supervision and 24 hour management of active medical problems listed below.  Physiatrist and rehab team continue to assess  barriers to discharge/monitor patient progress toward functional and medical goals  Care Tool:  Bathing    Body parts bathed by patient: Right arm,Left arm,Chest,Abdomen,Face,Front perineal area,Right upper leg,Left upper leg,Right lower leg,Left lower leg,Buttocks   Body parts bathed by helper: Buttocks     Bathing assist Assist Level: Supervision/Verbal cueing     Upper Body Dressing/Undressing Upper body dressing   What is the patient wearing?: Pull over shirt,Button up shirt    Upper body assist Assist Level: Set up assist    Lower Body Dressing/Undressing Lower body dressing      What is the patient wearing?: Incontinence brief,Pants     Lower body assist Assist for lower body dressing: Contact Guard/Touching assist     Toileting Toileting    Toileting assist Assist for toileting: Supervision/Verbal cueing     Transfers Chair/bed transfer  Transfers assist  Chair/bed transfer activity did not occur: Safety/medical concerns (orthostatic on eval)  Chair/bed transfer assist level: Supervision/Verbal cueing     Locomotion Ambulation   Ambulation assist   Ambulation activity did not occur: Safety/medical concerns (orthostatic on eval)  Assist level: Supervision/Verbal cueing Assistive device: Walker-rolling Max distance: 161ft   Walk 10 feet activity   Assist  Walk 10 feet activity did not  occur: Safety/medical concerns  Assist level: Supervision/Verbal cueing Assistive device: Walker-rolling   Walk 50 feet activity   Assist Walk 50 feet with 2 turns activity did not occur: Safety/medical concerns  Assist level: Supervision/Verbal cueing Assistive device: Walker-rolling    Walk 150 feet activity   Assist Walk 150 feet activity did not occur: Safety/medical concerns         Walk 10 feet on uneven surface  activity   Assist Walk 10 feet on uneven surfaces activity did not occur: Safety/medical concerns          Wheelchair     Assist Will patient use wheelchair at discharge?: No (Per PT long term goals)   Wheelchair activity did not occur: Safety/medical concerns (orthostatic on eval)  Wheelchair assist level: Supervision/Verbal cueing Max wheelchair distance: 83'    Wheelchair 50 feet with 2 turns activity    Assist    Wheelchair 50 feet with 2 turns activity did not occur: Safety/medical concerns   Assist Level: Supervision/Verbal cueing   Wheelchair 150 feet activity     Assist  Wheelchair 150 feet activity did not occur: Safety/medical concerns       Blood pressure 114/61, pulse 70, temperature 97.7 F (36.5 C), temperature source Oral, resp. rate 18, height 5\' 6"  (1.676 m), weight 96.3 kg, SpO2 93 %.  Medical Problem List and Plan: 1.Functional deficitssecondary to left coronoid process fracture and left hip labral and hamstring tendon tears with associated large hematoma which required I&D, vac -patient maynot yetshower- due to orthostatic hypotension  Continue CIR -ELOS/Goals: 14 days, mod I to supervision with PT and OT 2. Antithrombotics: -DVT/anticoagulation:Pharmaceutical:Transitioned to Eliquis.  -antiplatelet therapy: N/A 3. Pain Management:Decrease oxycodone to 10mg  q8H prn.   Continue Tylenol 650mg  TID.  4. Mood:LCSW to follow for evaluation and support. -antipsychotic agents:  5. Neuropsych: This patientiscapable of making decisions on herown behalf. 6. Skin/Wound Care:Will monitor wound for healing. Add protein supplement to promote wound healing. -VAC removed from wound yesterday and wound closed by ortho -continue local dressing -ACE wrap removed as it creating a tourniquet effect on thigh, skin almost broken down in fact  -advised to wear compression garments as tolerated but they are tight for her and she can remove if too  painful.   4/5- also has severe R elbow hematoma- agreeable to PICC if she requires further blood draws.   4/12: given chills, continued warmth to incision, requested surgical eval.   7. Fluids/Electrolytes/Nutrition:Monitor I/O. Lytes have been stable, repeat PRN.  8. CAF: Monitor HR tid--continue amiodarone 9. Hypothyroid: On supplement 10. Orthostatic hypotension: Will continue to monitor orthostatic vitals  --Florinef increased to 0.2 mg bid on 04/01 -blood transfusion today, may need more than one unit -difficult pt to apply TEDS/abd binder to given wounds and body habitus. -encourage PO intake  4/2- per note today Florinef increased, but was yesterday- will monitor- hopefully will improve with more pRBCs today.  I got 2 of PT today-  4/4: discussed with cards PA patient wants PICC if future blood draws required.   4/11: improved 11. H/o CVA with residual mild aphasia: On Crestor and Eliquis 12. OIC: Has not had BM since admission --augment bowel regimen  4/2- will give Mg citrate- had at least 2 BMs today- will monitor- also was manually disimimpacted- will also increase bowel meds  4/3- had many BMs- feeling much better- con't bowel meds 13. Acute blood loss anemia:   Hgb up to 9.3- iron supplement started.  14. Pre-renal  azotemia: BUN up to 25-->push pofluid intake  4/2- same- will push fluids  4/3- BUN down to 22- con't to push fluids PO 15. Leucocytosis: WBC has varied since surgery. Will monitor for fevers and other signs of infection.  --recheck white count in am.   4/2- stable- will monitor for infection.   4/3- down to 10k- con't to monitor 16. Left foot pain: ordered XR to assess for fracture given her fall. 17. Lymphedema: continue garments as much as tolerated; may remove if painful.  18. Brittle nails: Zinc supplement ordered while in hospital. Recommended 4 [place] nuts per day at home.  19.  Osteopenia: discussed risks of calcium supplement- she may refuse. Discussed high calcium foods, pharmacologic options.  20. Hypothyroidism: continue synthroid 68.33mcg weekly. 21. Chills: start Keflex 500mg  q12H. Afebrile and with normal WBC but with warmth to incision  21. Insomnia: melatonin 3mg  HS started 22. Disposition: d/c on 4/19. F/u with me on 01/18/21 to arrive in office at 9:20 for 9:40        LOS: 11 days [place] 01/02/2021, 9:45 AM

## 2021-01-02 NOTE — Progress Notes (Addendum)
Electrophysiology Rounding Note  Patient Name: Kayla Harrison Date of Encounter: 01/02/2021  Primary Cardiologist: [name], MD  Subjective   Up in chair on my arrival with abdominal binder on.    Chills again last night Hematoma remains quite uncomfortable,  wound care will assist   Inpatient Medications    Scheduled Meds: . amiodarone  200 mg Oral Daily  . apixaban  5 mg Oral BID  . cephALEXin  500 mg Oral Q12H  . ferrous sulfate  325 mg Oral Q breakfast  . fludrocortisone  0.2 mg Oral BID WC  . levothyroxine  137 mcg Oral Once per day on Sun Mon Tue Wed Thu Fri  . levothyroxine  68.5 mcg Oral Once per day on Sat  . lidocaine  2 patch Transdermal Q24H  . melatonin  3 mg Oral QHS  . midodrine  5 mg Oral BID WC  . multivitamin with minerals  1 tablet Oral Daily  . rosuvastatin  10 mg Oral Once per day on Mon Thu  . senna  2 tablet Oral BID  . sodium chloride  0.5 g Oral BID WC  . zinc sulfate  220 mg Oral Daily   Continuous Infusions:  PRN Meds: acetaminophen, alum & mag hydroxide-simeth, bisacodyl, diphenhydrAMINE, guaiFENesin-dextromethorphan, oxyCODONE, polyethylene glycol, prochlorperazine **OR** prochlorperazine **OR** prochlorperazine, sodium phosphate, traZODone   Vital Signs    Vitals:   01/01/21 0515 01/01/21 1304 01/01/21 1926 01/02/21 0428  BP: (!) 111/53 (!) 149/73 (!) 105/56 114/61  Pulse: 72 80 70 70  Resp: 18 17 17 18   Temp: 98.9 F (37.2 C) 98.2 F (36.8 C) 98.7 F (37.1 C) 97.7 F (36.5 C)  TempSrc: Oral Oral Oral Oral  SpO2: 99% 98% 96% 93%  Weight:      Height:        Intake/Output Summary (Last 24 hours) at 01/02/2021 0846 Last data filed at 01/02/2021 0814 Gross per 24 hour  Intake 360 ml  Output --  Net 360 ml   Filed Weights   12/25/20 1835  Weight: 96.3 kg    Physical Exam    Essentially unchanged today GEN- The patient is well appearing, alert and oriented x 3 today.   Head- normocephalic, atraumatic Eyes-   Sclera clear, conjunctiva pink Ears- hearing intact Oropharynx- clear Neck- supple Lungs- CTA b/l Heart- RRR, 1-2/6 SM, no rubs or gallops GI- soft, NT, ND, Extremities- no clubbing or cyanosis. No edema Skin- no rash or lesion Psych- euthymic mood, full affect Neuro- strength and sensation are intact  Hematoma L hip  Labs    CBC Recent Labs    01/01/21 1624  WBC 10.3  NEUTROABS 8.9*  HGB 9.2*  HCT 30.1*  MCV 100.3*  PLT 659   Basic Metabolic Panel Recent Labs    01/01/21 1624  NA 140  K 4.0  CL 109  CO2 25  GLUCOSE 155*  BUN 18  CREATININE 1.06*  CALCIUM 8.4*   Liver Function Tests Recent Labs    01/01/21 1624  AST 39  ALT 31  ALKPHOS 81  BILITOT 1.3*  PROT 5.2*  ALBUMIN 2.4*   No results for input(s): LIPASE, AMYLASE in the last 72 hours. Cardiac Enzymes No results for input(s): CKTOTAL, CKMB, CKMBINDEX, TROPONINI in the last 72 hours.   Telemetry    Not connected in CIR (personally reviewed)  Radiology    No results found.  Patient Profile      Admitted with a fall fracture of her  elbow, ribs. Also bruised hip Orthostatic vital signs showed profound decrease in blood pressure 110--80 going from lying to sitting.  Assessment & Plan    Atrial fibrillation-persistent     CHA2DS2Vasc is 6, on Eliquis, appropriately dosed Amiodarone therapy-longstanding Sinus node dysfunction w/PPM   Hip hematoma Anemia Hgb trending up last check  ?Chills    Afebrile, orange juice seems to help  Orthostatic hypotension-profound   Falls-recurrent  orthostatics today   111/57 (72bpm) supine   116/57 (65) sit     84/67 (69) stand, no msymptoms   No 3 min set  Looks like she is making progress with PT       Continue the same for now May be helpful to get orthostatics after she had gotten the midodrine Dr. Caryl Comes has seen the patient this am His thoughts to follow      For questions or updates, please contact [facility] Please consult [email or web address] for contact info under Cardiology/STEMI.  Signed, Harrison [place], PA-C  01/02/2021, 8:46 AM   Seen and examined.  Concerned about what she is calling tremors that they may be rigors and that she is a set up for infection.  Would recommend blood cultures but if they return  Continue support for her orthostasis with fludrocortisone and ProAmatine

## 2021-01-02 NOTE — Progress Notes (Signed)
Occupational Therapy Session Note  Patient Details  Name: Kayla Harrison MRN: [id number] Date of Birth: November 30, 1945  Today's Date: 01/02/2021 OT Individual Time: 7471-8550 OT Individual Time Calculation (min): 45 min    Short Term Goals: Week 1:  OT Short Term Goal 1 (Week 1): Pt will complete toilet transfer with 1 assist and LRAD OT Short Term Goal 1 - Progress (Week 1): Met OT Short Term Goal 2 (Week 1): Pt will complete LB dressing at sit<stand level to increase standing tolerance OT Short Term Goal 2 - Progress (Week 1): Met OT Short Term Goal 3 (Week 1): Pt will complete 1 grooming task while sitting at the sink to increase OOB tolerance OT Short Term Goal 3 - Progress (Week 1): Met Week 2:  OT Short Term Goal 1 (Week 2): STTG = LTGs due to remaining LOS  Skilled Therapeutic Interventions/Progress Updates:    1:1 Pt was received in the recliner. Continued focus on functional endurance. Pt ambulated from  her room to RN station. Pt then had a nose bleed- RN made aware. Iced applied to nose with pressure for a few minutes. Pt about to ambulated about ~ 100 feet before needing a seated rest break . Pt able to perform this about 6 times with seated rest breaks each time. Discussed energy conservation in the kitchen. Returned to room and transferred into the recliner. Then practiced sit to stands from recliner with bilateral hands (standing on foam) without RW in front of her 10 times to further address standing balance on complaint surface and endurance.   Left reclined back in the recliner.   Therapy Documentation Precautions:  Precautions Precautions: Fall Precaution Comments: orthostatic hypotension, B LE ACE wraps, Abdominal binder as approriate Restrictions Weight Bearing Restrictions: No LUE Weight Bearing: Weight bearing as tolerated Pain: Pain Assessment Pain Scale: 0-10 Pain Score: 0-No pain   Therapy/Group: Individual Therapy  Willeen Cass [facility] 01/02/2021,  3:58 PM

## 2021-01-02 NOTE — Plan of Care (Signed)
  Problem: Sit to Stand Goal: LTG:  Patient will perform sit to stand in prep for activites of daily living with assistance level (OT) Description: LTG:  Patient will perform sit to stand in prep for activites of daily living with assistance level (OT) Flowsheets (Taken 01/02/2021 0813) LTG: PT will perform sit to stand in prep for activites of daily living with assistance level: Independent with assistive device Note: Upgraded due to progress   Problem: RH Grooming Goal: LTG Patient will perform grooming w/assist,cues/equip (OT) Description: LTG: Patient will perform grooming with assist, with/without cues using equipment (OT) Flowsheets (Taken 01/02/2021 0816) LTG: Pt will perform grooming with assistance level of: Independent with assistive device  Note: Upgraded due to progress   Problem: RH Bathing Goal: LTG Patient will bathe all body parts with assist levels (OT) Description: LTG: Patient will bathe all body parts with assist levels (OT) Flowsheets (Taken 01/02/2021 0813) LTG: Pt will perform bathing with assistance level/cueing: Independent with assistive device  Note: Upgraded due to progress   Problem: RH Dressing Goal: LTG Patient will perform upper body dressing (OT) Description: LTG Patient will perform upper body dressing with assist, with/without cues (OT). Flowsheets (Taken 01/02/2021 0816) LTG: Pt will perform upper body dressing with assistance level of: Independent with assistive device Note: Upgraded due to progress Goal: LTG Patient will perform lower body dressing w/assist (OT) Description: LTG: Patient will perform lower body dressing with assist, with/without cues in positioning using equipment (OT) Flowsheets (Taken 01/02/2021 0813) LTG: Pt will perform lower body dressing with assistance level of: Independent with assistive device Note: Upgraded due to progress   Problem: RH Toileting Goal: LTG Patient will perform toileting task (3/3 steps) with assistance  level (OT) Description: LTG: Patient will perform toileting task (3/3 steps) with assistance level (OT)  Flowsheets (Taken 01/02/2021 0816) LTG: Pt will perform toileting task (3/3 steps) with assistance level: Independent with assistive device Note: Upgraded due to progress   Problem: RH Toilet Transfers Goal: LTG Patient will perform toilet transfers w/assist (OT) Description: LTG: Patient will perform toilet transfers with assist, with/without cues using equipment (OT) Flowsheets (Taken 01/02/2021 0816) LTG: Pt will perform toilet transfers with assistance level of: Independent with assistive device Note: Upgraded due to progress

## 2021-01-03 NOTE — Progress Notes (Signed)
Pt temperature elevated this evening, pt had low grade temperature last evening. Pt denies any pain remains A/O x4.  Voiding without difficulty. Will monitor temperature tylenol given per order.

## 2021-01-03 NOTE — Progress Notes (Signed)
Patient ID: Kayla Harrison, female   DOB: 19-Jul-1946, 75 y.o.   MRN: [id number]   LOS: 12 days   Subjective: Doing very well in CIR, pain much improved. Still having discharge from wound.   Objective: Vital signs in last 24 hours: Temp:  [98.6 F (37 C)-100.2 F (37.9 C)] 98.7 F (37.1 C) (04/13 0600) Pulse Rate:  [68-76] 73 (04/13 0412) Resp:  [16-24] 24 (04/13 0412) BP: (103-133)/(55-72) 133/71 (04/13 0412) SpO2:  [97 %-100 %] 98 % (04/12 1919) Last BM Date: 01/03/21   Laboratory  CBC Recent Labs    01/01/21 1624  WBC 10.3  HGB 9.2*  HCT 30.1*  PLT 214   BMET Recent Labs    01/01/21 1624  NA 140  K 4.0  CL 109  CO2 25  GLUCOSE 155*  BUN 18  CREATININE 1.06*  CALCIUM 8.4*     Physical Exam General appearance: alert and no distress  LLE: Incision clean and dry. Superior wound edges still not sealed. Mild amount of bloody discharge on dressing 24h out.   Assessment/Plan: Left hip hematoma -- Will leave sutures in for now. Recheck prior to discharge in one week.    Lisette Abu, PA-C Orthopedic Surgery [phone] 01/03/2021

## 2021-01-03 NOTE — Progress Notes (Signed)
Patient ID: Kayla Harrison, female   DOB: [birth year], 75 y.o.   MRN: [id number] Team Conference Report to Patient/Family  Team Conference discussion was reviewed with the patient and caregiver, including goals, any changes in plan of care and target discharge date.  Patient and caregiver express understanding and are in agreement.  The patient has a target discharge date of 01/09/21.  SW met with pt and called sister at beside. Provided updates. Pt reports she is no longer interested in hospital bed. Sw will cont to follow up  Dyanne Iha 01/03/2021, 9:07 AM

## 2021-01-03 NOTE — Progress Notes (Signed)
Physical Therapy Session Note  Patient Details  Name: Kayla Harrison MRN: [id number] Date of Birth: [birth year]  Today's Date: 01/03/2021 PT Individual Time: 1300-1325 PT Individual Time Calculation (min): 25 min   Short Term Goals: Week 2:  PT Short Term Goal 1 (Week 2): STG=LTG due to LOS  Skilled Therapeutic Interventions/Progress Updates:    Patient received sitting up in recliner, agreeable to PT. She denies pain and requests to ambulate. Sitting BP: 117/60, standing BP: 117/60, no reports of OH. Patient able to come to stand with RW and supervision. Ambulating ~243ft with RW and supervision. Upon returning to her room, patient requesting to use BSC. Clothing management and perihygiene in standing with supervision. Continent of bladder. Returning to wc for next therapy session. Call light within reach.   Therapy Documentation Precautions:  Precautions Precautions: Fall Precaution Comments: orthostatic hypotension, B LE ACE wraps, Abdominal binder as approriate Restrictions Weight Bearing Restrictions: No LUE Weight Bearing: Weight bearing as tolerated    Therapy/Group: Individual Therapy  Karoline Caldwell, PT, DPT, CBIS  01/03/2021, 7:50 AM

## 2021-01-03 NOTE — Progress Notes (Signed)
Occupational Therapy Session Note  Patient Details  Name: Kayla Harrison MRN: [id number] Date of Birth: 08-23-1946  Today's Date: 01/03/2021 OT Individual Time: 4627-0350 OT Individual Time Calculation (min): 46 min    Short Term Goals: Week 2:  OT Short Term Goal 1 (Week 2): STTG = LTGs due to remaining LOS  Skilled Therapeutic Interventions/Progress Updates:     Treatment session with focus on activity tolerance and endurance.  Pt received upright in recliner reporting already bathed/dressed after setup from nurse tech.  Pt requesting to focus on increased ambulation and endurance.  Pt requested BP checked prior to ambulation as cardiologist wanting standing BP (see flowsheets).  Pt ambulated 152' and 168' with RW and supervision.  Pt reports no lightheadedness with ambulation this time but reports fatigue with increased activity.  Discussed use of Apple watch with new fall detection feature.  Pt reports having a friend who has one and they were able to look more into it - she plans to purchase one.  Pt ambulated 100' and 57' with RW with supervision, still with no reports of lightheadedness but reports fatigue.  Pt returned to supine in bed to allow for inspection of hip incision and possible removal of sutures.  Pt pleased with progress overall, still wanting to have increased endurance with ambulation.    Therapy Documentation Precautions:  Precautions Precautions: Fall Precaution Comments: orthostatic hypotension, B LE ACE wraps, Abdominal binder as approriate Restrictions Weight Bearing Restrictions: No LUE Weight Bearing: Weight bearing as tolerated General:   Vital Signs: Therapy Vitals Temp: 98.7 F (37.1 C) Temp Source: Oral Pulse Rate: 73 Resp: (!) 24 BP: 133/71 Patient Position (if appropriate): Lying Pain: Pain Assessment Pain Scale: 0-10 Pain Score: Asleep Pain Type: Acute pain Pain Location: Head Pain Descriptors / Indicators: Aching Pain Frequency:  Intermittent Pain Onset: Gradual Patients Stated Pain Goal: 0 Pain Intervention(s): Medication (See eMAR)   Therapy/Group: Individual Therapy  Simonne Come 01/03/2021, 6:57 AM

## 2021-01-03 NOTE — Consult Note (Signed)
Neuropsychological Consultation   Patient:   Kayla Harrison   DOB:   09-06-1946  MR Number:  [id number]  Location:  [facility] [id number] [place] [address] Dept: [place]: [phone]           Date of Service:   01/03/2021  Start Time:   3 PM End Time:   4 PM  Provider/Observer:  Ilean Skill, Psy.D.       Clinical Neuropsychologist       Billing Code/Service: [phone]  Chief Complaint:    KEELAN POMERLEAU is a 75 year old female with history of lung cancer, NHL with SVC syndrome, LAP-BAND surgery, A. fib on Eliquis, chronic low blood pressure, CVA with lingering expressive deficits but largely recovered.  The patient's CVA occurred in 2001/2 and potentially was related to her A. fib prior to being identified and treated.  The patient has recently had numerous falls prior to her recent admission related to possibly low blood pressure and syncopal events as well as getting tangled up with furniture/apparatus when waking up at night.  Patient was admitted on 12/15/2020 after a fall with reports of headache, acute on chronic neck and back pain as well as increasing weakness.  Patient was found to have a nondisplaced left coronoid process fracture with question of lucency over lateral aspect of radial head and large SQ hematoma left hip with moderate OA.  Patient continues to have severe hip pain with significant edema and concerns of skin necrosis.  MRI left hip showed large hematoma left hip 10 cm in size, partial thickness tear of anterior labrum and low-grade partial tears of bilateral hamstring tendon origin.  Patient was treated o surgically.  Patient was limited by weakness with mild dizziness and headaches and CIR was recommended due to functional decline.  Reason for Service:  Patient initially had some degree of anxiety with loss of functioning and referred for  neuropsychological consultation due to coping adjustment issues at least initially.  Below is the HPI for the current admission.  JQB:HALPFX VanSchaiack is a 75 year old female with history of Lung cancer, NHL w/ SVC syndrome, lap band surgery, A fib--on Eliquis, chronic LBP, CVA with expressive deficits who was admitted on 12/15/20 after a fall with reports of HA, acute on chronic neck and back pain as well as increase in weakness. She was found to have non-displaced left coronoid process fracture with question of lucency over lateral aspect of radial head and large SQ hematoma left hip with moderate OA. Ortho evaluated patient and recommended WBAT left elbow and questioned need of I &D of left hip hematoma. Right rib pain treated with lidocaine patch. Eliquis held and she was placed on heparin bridge.   She continued to have severe hip pain with significant edema and concerns of skin necrosis. MRI left hip showed large hematoma left hip 10 cm in size, partial thickness tear of anterosuperior labrum and low grade partial tears of bilateral hamstring tendon origin. She was taken to OR on 03/29 for I & D of latera hip hematoma by Dr. Lucia Gaskins. Dr. Caryl Comes consulted for input on h/o recurrent falls and profound orthostatic hypotension with near syncope with activity. Florinef added and titrated upwards. Post op wound VAC removed 03/31 and wound closed. She has not had BM since surgery and Mag citrate added today. She is WBAT and showing improvement in activity tolerance but continues to be limited by  weakness with mild dizziness and HA. CIR recommended due to functional decline.   Current Status:  Patient was oriented and alert and was able to describe events leading up to her recent hospitalization.  Patient has been struggling with low blood pressure and A. fib for some time with multiple attempts to address these issues over the past 20 years.  Patient has been having more falls and was able to describe  events around his falls.  Patient reports that she is well versed in hospital treatments with regard to her past cancer, gastric bypass surgery, A. fib and previous CVA.  Patient reports that she was coping well with the hospitalization and is looking forward to discharge on 4/19 and has been progressing very well.  Behavioral Observation: DEVERA ENGLANDER  presents as a 75 y.o.-year-old Right Caucasian Female who appeared her stated age. her dress was Appropriate and she was Well Groomed and her manners were Appropriate to the situation.  her participation was indicative of Appropriate and Attentive behaviors.  There were physical disabilities noted.  she displayed an appropriate level of cooperation and motivation.     Interactions:    Active Appropriate and Attentive  Attention:   within normal limits and attention span and concentration were age appropriate  Memory:   within normal limits; recent and remote memory intact  Visuo-spatial:  not examined  Speech (Volume):  normal  Speech:   normal; normal  Thought Process:  Coherent and Relevant  Though Content:  WNL; not suicidal and not homicidal  Orientation:   person, place, time/date and situation  Judgment:   Good  Planning:   Good  Affect:    Appropriate  Mood:    Euthymic  Insight:   Good  Intelligence:   high  Medical History:   Past Medical History:  Diagnosis Date  . Anemia   . Arthritis    osteoarthritis - knees and right shoulder  . Blood transfusion without reported diagnosis   . Breast cancer ([place])    Dr Margot Chimes, total thyroidectomy- 1999- for cancer  . 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 Chronic bilateral pleural effusions   . Colon polyp    Dr Earlean Shawl  . Complication of anesthesia    Ketamine produces LSD reaction, bright colored nightmarish experience   . Dyslipidemia   . Endometriosis   . Fibroids   . H/O pleural effusion    s/p thoracentesis w 3235ml withdrawn  . Hepatitis    [place] ?hepatitis   . History of dysphagia    due to radiation therapy  . History of hiatal hernia    small noted on PET scan  . Hypertension   . Hypothyroidism   . Lung cancer, lower lobe ([place]) 01/2017   radiation RX completed 03/04/17; will start chemo 6/27, pt unaware of lung cancer  . Morbid obesity ([place])    Status post lap band surgery  . Nephrolithiasis   . Non Hodgkin's lymphoma ([place])    on chemotherapy  . Persistent atrial fibrillation ([place])    a. s/p PVI 2008 b. s/p convergent ablation 6962 complicated by bradycardia requiring pacemaker implant  . Personal history of radiation therapy   . Presence of permanent cardiac pacemaker   . Rotator cuff tear    Right  . Stroke [facility])    2003- [place] x2  . SVC syndrome    with lung mass and non hodgkins lymphoma  . Thyroid cancer ([place]) 2000  Patient Active Problem List   Diagnosis Date Noted  . Physical debility 12/22/2020  . Tear of left hamstring 12/22/2020  . Labral tear of left hip joint 12/22/2020  . Acute blood loss anemia   . Orthostatic hypotension 12/17/2020  . History of CVA (cerebrovascular accident) 12/17/2020  . Fall 12/15/2020  . Hip hematoma, left, initial encounter 12/15/2020  . Nondisplaced fracture of coronoid process of left ulna, initial encounter for closed fracture 12/15/2020  . Multiple rib fractures 12/15/2020  . COVID-19 virus infection 10/12/2020  . Constipation 06/22/2020  . Change in bowel habits 06/22/2020  . Diverticulosis 06/22/2020  . Dark stools 06/22/2020  . Hemorrhoids 06/22/2020  . Pacemaker 01/23/2020  . Junctional rhythm 01/23/2020  . Palpitations 01/23/2020  . ICH (intracerebral hemorrhage) ([place]) 12/02/2019  . A-fib ([place]) 12/02/2019  . Anemia 12/02/2019  . QT prolongation 12/02/2019  . Hypothyroidism 12/02/2019  . Gait abnormality 07/10/2018  . S/P reverse total shoulder arthroplasty, 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  . Persistent atrial fibrillation ([place])   . Bilateral  ureteral calculi 11/17/2017  . Atrial fibrillation with RVR ([place]) 09/15/2017  . Atrial flutter ([place]) 07/17/2017  . Port catheter in place 04/02/2017  . Non-Hodgkin lymphoma, unspecified, intrathoracic lymph nodes ([place]) 02/13/2017  . Mediastinal mass 02/06/2017  . 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 Abnormal chest x-ray   . Lung mass 02/03/2017  . 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  . OSA (obstructive sleep apnea) 02/03/2015  . History of renal calculi 11/16/2014  . Renal calculi 11/16/2014  . Family history of colon cancer 10/26/2014  . Mechanical complication due to cardiac pacemaker pulse generator 11/06/2013  . Dyspnea 05/12/2013  . Hypothyroidism 04/21/2013  . Pleural effusion 04/19/2013  . Long term (current) use of anticoagulants 12/20/2010  . EPIDERMOID CYST 08/22/2010  . Chronic diastolic heart failure ([place]) 08/06/2010  . SYNCOPE AND COLLAPSE 07/27/2010  . OSTEOARTHRITIS, KNEE, RIGHT 03/15/2010  . Morbid obesity ([place]) 12/07/2009  . NONSPEC ELEVATION OF LEVELS OF TRANSAMINASE/LDH 09/08/2009  . BREAST CANCER, HX OF 07/25/2009  . COLONIC POLYPS, HX OF 07/25/2009  . TUBULOVILLOUS ADENOMA, COLON 04/29/2008  . HYPERGLYCEMIA, FASTING 04/29/2008  . HYPERLIPIDEMIA 06/22/2007  . CARCINOMA, THYROID GLAND, HX OF 06/22/2007   Psychiatric History:  No prior psychiatric history noted  Family Med/Psych History:  Family History  Problem Relation Age of Onset  . Heart disease Father 33       MI @autopsy   . Colon cancer Father        COLON  . Heart attack Father   . Other Mother        temporal arteritis   . Diabetes Sister   . Diabetes Brother   . Diabetes Paternal Aunt   . Diabetes Paternal Grandmother   . Esophageal cancer Neg Hx   . Inflammatory bowel disease Neg Hx   . Liver disease Neg Hx   . Pancreatic cancer Neg Hx   . Stomach cancer Neg Hx    Impression/DX:  KYANN HEYDT is a 75 year old female with history of lung cancer, NHL with SVC  syndrome, LAP-BAND surgery, A. fib on Eliquis, chronic low blood pressure, CVA with lingering expressive deficits but largely recovered.  The patient's CVA occurred in 2001/2 and potentially was related to her A. fib prior to being identified and treated.  The patient has recently had numerous falls prior to her recent admission related to possibly low blood pressure and syncopal events as well as getting tangled up with furniture/apparatus when waking up  at night.  Patient was admitted on 12/15/2020 after a fall with reports of headache, acute on chronic neck and back pain as well as increasing weakness.  Patient was found to have a nondisplaced left coronoid process fracture with question of lucency over lateral aspect of radial head and large SQ hematoma left hip with moderate OA.  Patient continues to have severe hip pain with significant edema and concerns of skin necrosis.  MRI left hip showed large hematoma left hip 10 cm in size, partial thickness tear of anterior labrum and low-grade partial tears of bilateral hamstring tendon origin.  Patient was treated o surgically.  Patient was limited by weakness with mild dizziness and headaches and CIR was recommended due to functional decline.  Patient was oriented and alert and was able to describe events leading up to her recent hospitalization.  Patient has been struggling with low blood pressure and A. fib for some time with multiple attempts to address these issues over the past 20 years.  Patient has been having more falls and was able to describe events around his falls.  Patient reports that she is well versed in hospital treatments with regard to her past cancer, gastric bypass surgery, A. fib and previous CVA.  Patient reports that she was coping well with the hospitalization and is looking forward to discharge on 4/19 and has been progressing very well.    Diagnosis:    Hematoma - Plan: CANCELED: [place] RT UPPER EXTREM LTD SOFT TISSUE NON VASCULAR,  CANCELED: [place] RT UPPER EXTREM LTD SOFT TISSUE NON VASCULAR  Pain - Plan: DG Foot Complete Left, DG Foot Complete Left         Electronically Signed   _______________________ Ilean Skill, Psy.D. Clinical Neuropsychologist

## 2021-01-03 NOTE — Progress Notes (Signed)
Electrophysiology Rounding Note  Patient Name: Kayla Harrison Date of Encounter: 01/03/2021  Primary Cardiologist: [name], MD  Subjective   Sitting up.  No rigors last night.  Some nausea and low-grade fever and loose stool.  Ongoing drainage from her left hip  Inpatient Medications    Scheduled Meds: . amiodarone  200 mg Oral Daily  . apixaban  5 mg Oral BID  . cephALEXin  500 mg Oral Q12H  . ferrous sulfate  325 mg Oral Q breakfast  . fludrocortisone  0.2 mg Oral BID WC  . levothyroxine  137 mcg Oral Once per day on Sun Mon Tue Wed Thu Fri  . levothyroxine  68.5 mcg Oral Once per day on Sat  . lidocaine  2 patch Transdermal Q24H  . melatonin  3 mg Oral QHS  . midodrine  5 mg Oral BID WC  . multivitamin with minerals  1 tablet Oral Daily  . rosuvastatin  10 mg Oral Once per day on Mon Thu  . senna  2 tablet Oral BID  . sodium chloride  0.5 g Oral BID WC  . zinc sulfate  220 mg Oral Daily   Continuous Infusions:  PRN Meds: acetaminophen, alum & mag hydroxide-simeth, bisacodyl, diphenhydrAMINE, guaiFENesin-dextromethorphan, oxyCODONE, polyethylene glycol, prochlorperazine **OR** prochlorperazine **OR** prochlorperazine, sodium phosphate, traZODone   Vital Signs    Vitals:   01/02/21 1341 01/02/21 1919 01/03/21 0412 01/03/21 0600  BP: (!) 117/55 125/72 133/71   Pulse: 76 68 73   Resp: 17 16 (!) 24   Temp: 98.6 F (37 C) 99.3 F (37.4 C) 100.2 F (37.9 C) 98.7 F (37.1 C)  TempSrc: Oral Oral Oral Oral  SpO2: 98% 98%    Weight:      Height:        Intake/Output Summary (Last 24 hours) at 01/03/2021 1109 Last data filed at 01/03/2021 0830 Gross per 24 hour  Intake 460 ml  Output --  Net 460 ml   Filed Weights   12/25/20 1835  Weight: 96.3 kg    Physical Exam    Well developed and nourished in no acute distress HENT normal Neck supple   Clear Regular rate and rhythm  Abd-soft   No Clubbing cyanosis1+  edema Skin-warm and dry A &  Oriented  Grossly normal sensory and motor function  *     Labs    CBC Recent Labs    01/01/21 1624  WBC 10.3  NEUTROABS 8.9*  HGB 9.2*  HCT 30.1*  MCV 100.3*  PLT 254   Basic Metabolic Panel Recent Labs    01/01/21 1624  NA 140  K 4.0  CL 109  CO2 25  GLUCOSE 155*  BUN 18  CREATININE 1.06*  CALCIUM 8.4*   Liver Function Tests Recent Labs    01/01/21 1624  AST 39  ALT 31  ALKPHOS 81  BILITOT 1.3*  PROT 5.2*  ALBUMIN 2.4*   No results for input(s): LIPASE, AMYLASE in the last 72 hours. Cardiac Enzymes No results for input(s): CKTOTAL, CKMB, CKMBINDEX, TROPONINI in the last 72 hours.   Telemetry    Not connected in CIR (personally reviewed)  Radiology    No results found.  Patient Profile      Admitted with a fall fracture of her elbow, ribs. Also bruised hip Orthostatic vital signs showed profound decrease in blood pressure 110--80 going from lying to sitting.  Assessment & Plan    Atrial fibrillation-persistent     CHA2DS2Vasc  is 6, on Eliquis, appropriately dosed Amiodarone therapy-longstanding Sinus node dysfunction w/PPM   Hip hematoma Anemia Hgb trending up last check  ?Chills/rigors    Afebrile, orange juice seems to help  Orthostatic hypotension-profound   Falls-recurrent  orthostatics today   111/57 (72bpm) supine   116/57 (65) sit     84/67 (69) stand, no msymptoms   No 3 min set  Looks like she is making progress with PT       Continue the same for now May be helpful to get orthostatics after she had gotten the midodrine Dr. Caryl Comes has seen the patient this am His thoughts to follow      For questions or updates, please contact [facility] Please consult [email or web address] for contact info under Cardiology/STEMI.   Orthostatic intolerance profound hypotension-improving fludrocortisone and ProAmatine  Rigors/chills  Hematoma status post operative evacuation with ongoing drainage  Atrial  fibrillation-persistent on amiodarone and anticoagulation  Orthostasis is somewhat improved.  We will continue the current medical regime as well as using the abdominal binder.  She will need thigh compression.  I am concerned about what she is calling chills and some more like rigors.  It is particularly in the context of her implanted device and her recent hip surgery concerned about bacteremia even more so with the history of low-grade fever overnight.   This may be related to her GI issue but in the event that she has recurrent rigors and fevers I would recommend blood cultures.  I wonder to whether her cephalexin should be stopped.  I would worry about C. difficile in the setting of chronic antibiotics in the hospital abdomen    Signed, [name], MD  01/03/2021, 11:09 AM

## 2021-01-03 NOTE — Progress Notes (Signed)
Physical Therapy Session Note  Patient Details  Name: Kayla Harrison MRN: [id number] Date of Birth: 01/29/1946  Today's Date: 01/03/2021 PT Individual Time: 1100-1154 and 1330-1425  PT Individual Time Calculation (min): 54 min and 55 min  Short Term Goals: Week 1:  PT Short Term Goal 1 (Week 1): Patient will perform bed mobility with supervision consistently. PT Short Term Goal 1 - Progress (Week 1): Met PT Short Term Goal 2 (Week 1): Patient will perform basic transfers with CGA using LRAD. PT Short Term Goal 2 - Progress (Week 1): Met PT Short Term Goal 3 (Week 1): Patient will perform standing tolerace without symptoms >4 min. PT Short Term Goal 3 - Progress (Week 1): Met PT Short Term Goal 4 (Week 1): Patient will ambulate >20 ft using LRAD with min A. PT Short Term Goal 4 - Progress (Week 1): Met Week 2:  PT Short Term Goal 1 (Week 2): STG=LTG due to LOS  Skilled Therapeutic Interventions/Progress Updates:   Treatment Session 1: 1100-1154 54 min Received pt sitting in recliner, pt agreeable to therapy, and denied any pain during session but reported feeling fatigued. Session with focus on functional mobility/transfers, generalized strengthening, dynamic standing balance/coordination, ambulation, stair navigation, and improved activity tolerance. Abdominal binder donned throughout session.  Orthostatics: Sitting: 119/67 Standing: 113/72 Standing after 3 minutes: 117/64 Pt ambulated 36ft with RW and supervision to commode and able to manage clothing and void with supervision. Pt able to perform peri-care with supervision and stood at sink and washed hands with supervision. Pt transported to 4W therapy gym in [facility] total A for time management purposes and navigated 4 steps x 2 trials with 2 rails and CGA. Trial 1 ascending and descending with a step through pattern and Trial 2 ascending and descending with a step to pattern due to fatigue. Pt performed seated BLE strengthening on  Kinetron at 20 cm/sec for 1 minute x 4 trials with BUE support with emphasis on core, glute, and quad strengthening. Worked on dynamic standing balance playing cornhole without AD and CGA for balance x 1 trial. Pt then requested to be weighed and stepped on/off scale x 3 different times with CGA. Pt transported back to room in [facility] total A and ambulated 38ft with RW and supervision to recliner. Concluded session with pt sitting in recliner, needs within reach, and chair pad alarm on.   Treatment Session 2: 1330-1425 55 min Received pt sitting in WC ready to go outside, pt agreeable to therapy, and denied any pain during session. Session with focus on functional mobility/transfers, generalized strengthening, dynamic standing balance/coordination, gait training, community navigation, and improved activity tolerance. Abdominal binder donned throughout session. Pt transported outside to entrance of [place] in [place] total A for time management and energy conservation purposes. Pt ambulated 122ft x 1, 59ft x 1, 29ft x 1, and 4ft x1 with RW and supervision/CGA over uneven concrete and brick surfaces and up/down mild slopes. Pt required min cues for RW safety when sitting as pt frequently abandoning RW when turning to sit. Pt reported lightheadedness and "difficulty focusing" but reported symptoms due to bright sun outside. Pt performed 3 transfers on/off low sitting park benches with CGA/close supervision. Pt performed the following exercises sitting on park bench with supervision and verbal cues for technique: -LAQ 3x10 bilaterally -hip flexion 3x10 bilaterally  -hip abd/add with knee extended 2x10 bilaterally  Pt performed WC mobility 11ft x 1 and 62ft x 1 with min A overall over uneven surfaces (concrete) and  up/down mild slopes (required mod A when ascending hills). Pt requested to look around gift shop and was transported in [facility] dependently around gift shop due to time restrictions. Pt transported back to room in [facility]  total A and ambulated 27ft with RW and supervision to recliner and changed shoes. Pt then transferred into bed with supervision and doffed abdominal binder with total A. Concluded session with pt supine in bed, needs within reach, and bed alarm on. Provided pt with fresh ice water.   Therapy Documentation Precautions:  Precautions Precautions: Fall Precaution Comments: orthostatic hypotension, B LE ACE wraps, Abdominal binder as approriate Restrictions Weight Bearing Restrictions: No LUE Weight Bearing: Weight bearing as tolerated  Therapy/Group: Individual Therapy Alfonse Alpers PT, DPT   01/03/2021, 7:38 AM

## 2021-01-04 LAB — URINALYSIS, ROUTINE W REFLEX MICROSCOPIC
Bilirubin Urine: NEGATIVE
Glucose, UA: NEGATIVE mg/dL
Ketones, ur: NEGATIVE mg/dL
Nitrite: NEGATIVE
Protein, ur: 100 mg/dL — AB
RBC / HPF: >50 RBC/hpf — ABNORMAL HIGH (ref 0–5)
Specific Gravity, Urine: 1.016 (ref 1.005–1.030)
WBC, UA: >50 WBC/hpf — ABNORMAL HIGH (ref 0–5)
pH: 5 (ref 5.0–8.0)

## 2021-01-04 NOTE — Progress Notes (Signed)
Patient ID: Kayla Harrison, female   DOB: 03/09/1946, 75 y.o.   MRN: [id number]   [facility] agency list provided to pt for review. Pt currently declining HH due to feeling back to prior level, therapist have made a recommendation for [facility] follow up. Sw arrange [facility] and allow pt to make decision at d/c.   [place], [place]

## 2021-01-04 NOTE — Progress Notes (Signed)
Pt has been afebrile throughout the night. Resting requested to eat later in day.

## 2021-01-04 NOTE — Progress Notes (Signed)
PROGRESS NOTE   Subjective/Complaints: Had fever last night and has dysuria- UA/UC ordered She enjoyed eating the nuts and speaking with Dr. Sima Matas Has not felt any dizziness: discussed stopping the salt tabs.  Still with loose stool- stool softeners d/ced  ROS:  Pt denies CP, SOB, N/V/D, dizziness improved  Objective:   No results found. Recent Labs    01/01/21 1624  WBC 10.3  HGB 9.2*  HCT 30.1*  PLT 214   Recent Labs    01/01/21 1624  NA 140  K 4.0  CL 109  CO2 25  GLUCOSE 155*  BUN 18  CREATININE 1.06*  CALCIUM 8.4*    Intake/Output Summary (Last 24 hours) at 01/04/2021 0932 Last data filed at 01/04/2021 0819 Gross per 24 hour  Intake 360 ml  Output --  Net 360 ml        Physical Exam: Vital Signs Blood pressure 127/66, pulse 73, temperature 98 F (36.7 C), temperature source Oral, resp. rate 16, height 5\' 6"  (1.676 m), weight 96.3 kg, SpO2 97 %. Gen: no distress, normal appearing HEENT: oral mucosa pink and moist, NCAT Cardio: Reg rate Chest: normal effort, normal rate of breathing Abd: soft, non-distended Ext: no edema Psych: pleasant, normal affect Skin: intact  Musculoskeletal:  General: Swellingand deformitypresent.  Cervical back: Normal range of motion.  Right lower leg: Edemapresent.  Left lower leg: Edemapresent.  Skin: Findings: Bruisingand erythemapresent. Hip incision CDI 4/9 small amt serosang fluid on telfa L hip, ecchymosis is 60% less than on photo below    MS: RUE 5/5 prox to distal. LUE 4-/5 with limitations at elbow d/t pain. LUE 2/5 prox to 4/5 distally d/t pain. RLE 3+ prox to 4+/5 distally. No focal sensory findings. SIt to stand with sup    Assessment/Plan: 1. Functional deficits which require 3+ hours per day of interdisciplinary therapy in a comprehensive inpatient rehab setting.  Physiatrist is providing close team  supervision and 24 hour management of active medical problems listed below.  Physiatrist and rehab team continue to assess barriers to discharge/monitor patient progress toward functional and medical goals  Care Tool:  Bathing    Body parts bathed by patient: Right arm,Left arm,Chest,Abdomen,Face,Front perineal area,Right upper leg,Left upper leg,Right lower leg,Left lower leg,Buttocks   Body parts bathed by helper: Buttocks     Bathing assist Assist Level: Supervision/Verbal cueing     Upper Body Dressing/Undressing Upper body dressing   What is the patient wearing?: Pull over shirt,Button up shirt    Upper body assist Assist Level: Set up assist    Lower Body Dressing/Undressing Lower body dressing      What is the patient wearing?: Incontinence brief,Pants     Lower body assist Assist for lower body dressing: Contact Guard/Touching assist     Toileting Toileting    Toileting assist Assist for toileting: Supervision/Verbal cueing     Transfers Chair/bed transfer  Transfers assist  Chair/bed transfer activity did not occur: Safety/medical concerns (orthostatic on eval)  Chair/bed transfer assist level: Supervision/Verbal cueing     Locomotion Ambulation   Ambulation assist   Ambulation activity did not occur: Safety/medical concerns (orthostatic on eval)  Assist level: Supervision/Verbal  cueing Assistive device: Walker-rolling Max distance: 200   Walk 10 feet activity   Assist  Walk 10 feet activity did not occur: Safety/medical concerns  Assist level: Supervision/Verbal cueing Assistive device: Walker-rolling   Walk 50 feet activity   Assist Walk 50 feet with 2 turns activity did not occur: Safety/medical concerns  Assist level: Supervision/Verbal cueing Assistive device: Walker-rolling    Walk 150 feet activity   Assist Walk 150 feet activity did not occur: Safety/medical concerns  Assist level: Supervision/Verbal cueing Assistive  device: Walker-rolling    Walk 10 feet on uneven surface  activity   Assist Walk 10 feet on uneven surfaces activity did not occur: Safety/medical concerns         Wheelchair     Assist Will patient use wheelchair at discharge?: No (Per PT long term goals)   Wheelchair activity did not occur: Safety/medical concerns (orthostatic on eval)  Wheelchair assist level: Supervision/Verbal cueing Max wheelchair distance: 35'    Wheelchair 50 feet with 2 turns activity    Assist    Wheelchair 50 feet with 2 turns activity did not occur: Safety/medical concerns   Assist Level: Supervision/Verbal cueing   Wheelchair 150 feet activity     Assist  Wheelchair 150 feet activity did not occur: Safety/medical concerns       Blood pressure 127/66, pulse 73, temperature 98 F (36.7 C), temperature source Oral, resp. rate 16, height 5\' 6"  (1.676 m), weight 96.3 kg, SpO2 97 %.  Medical Problem List and Plan: 1.Functional deficitssecondary to left coronoid process fracture and left hip labral and hamstring tendon tears with associated large hematoma which required I&D, vac -patient maynot yetshower- due to orthostatic hypotension  Continue CIR -ELOS/Goals: 14 days, mod I to supervision with PT and OT 2. Antithrombotics: -DVT/anticoagulation:Pharmaceutical:Transitioned to Eliquis.  -antiplatelet therapy: N/A 3. Pain Management:Decrease oxycodone to 10mg  q8H prn.   Continue Tylenol 650mg  TID.  4. Mood:LCSW to follow for evaluation and support. -antipsychotic agents:  5. Neuropsych: This patientiscapable of making decisions on herown behalf. 6. Skin/Wound Care:Will monitor wound for healing. Add protein supplement to promote wound healing. -VAC removed from wound yesterday and wound closed by ortho -continue local dressing -ACE wrap removed as it  creating a tourniquet effect on thigh, skin almost broken down in fact  -advised to wear compression garments as tolerated but they are tight for her and she can remove if too painful.   4/5- also has severe R elbow hematoma- agreeable to PICC if she requires further blood draws.   4/12: given chills, continued warmth to incision, requested surgical eval.   7. Fluids/Electrolytes/Nutrition:Monitor I/O. Lytes have been stable, repeat PRN.  8. CAF: Monitor HR tid--continue amiodarone 9. Hypothyroid: On supplement 10. Orthostatic hypotension: Will continue to monitor orthostatic vitals  --Florinef increased to 0.2 mg bid on 04/01  4/14: improved, d/c salt tabs 11. H/o CVA with residual mild aphasia: On Crestor and Eliquis 12. OIC: Has not had BM since admission --augment bowel regimen  4/2- will give Mg citrate- had at least 2 BMs today- will monitor- also was manually disimimpacted- will also increase bowel meds  4/3- had many BMs- feeling much better- con't bowel meds 13. Acute blood loss anemia:   Hgb up to 9.3- iron supplement started.  14. Pre-renal azotemia: BUN up to 25-->push pofluid intake  4/2- same- will push fluids  4/3- BUN down to 22- con't to push fluids PO 15. Leucocytosis: WBC has varied since surgery. Will monitor for fevers and  other signs of infection.  --recheck white count in am.   4/2- stable- will monitor for infection.   4/3- down to 10k- con't to monitor 16. Left foot pain: ordered XR to assess for fracture given her fall. 17. Lymphedema: continue garments as much as tolerated; may remove if painful.  18. Brittle nails: Zinc supplement ordered while in hospital. Recommended 4 [place] nuts per day at home.  19. Osteopenia: discussed risks of calcium supplement- she may refuse. Discussed high calcium foods, pharmacologic options.  20. Hypothyroidism: continue synthroid 68.17mcg weekly. 21. Insomnia: melatonin 3mg  HS started 22.  Dysuria: UA/UC ordered.  23. Hypoalbuminema: brought nuts for her which she liked, will bring more tomorrow.  23. Disposition: d/c on 4/19. F/u with me on 01/18/21 to arrive in office at 9:20 for 9:40        LOS: 13 days [place] 01/04/2021, 9:32 AM

## 2021-01-04 NOTE — Progress Notes (Signed)
Physical Therapy Session Note  Patient Details  Name: Kayla Harrison MRN: [id number] Date of Birth: 1945/09/28  Today's Date: 01/04/2021 PT Individual Time: 2993-7169 and 1300-1355 PT Individual Time Calculation (min): 69 min and 55 min  Short Term Goals: Week 1:  PT Short Term Goal 1 (Week 1): Patient will perform bed mobility with supervision consistently. PT Short Term Goal 1 - Progress (Week 1): Met PT Short Term Goal 2 (Week 1): Patient will perform basic transfers with CGA using LRAD. PT Short Term Goal 2 - Progress (Week 1): Met PT Short Term Goal 3 (Week 1): Patient will perform standing tolerace without symptoms >4 min. PT Short Term Goal 3 - Progress (Week 1): Met PT Short Term Goal 4 (Week 1): Patient will ambulate >20 ft using LRAD with min A. PT Short Term Goal 4 - Progress (Week 1): Met Week 2:  PT Short Term Goal 1 (Week 2): STG=LTG due to LOS  Skilled Therapeutic Interventions/Progress Updates:   Treatment Session 1: 6789-3810 69 min Received pt sitting in WC getting dressed with OT. Pt agreeable to therapy and denied any pain during session but reported burning sensation with urination and c/o loose stool. MD present for morning rounds and notified of symptoms. Session with focus on dressing, functional mobility/transfers, generalized strengthening, dynamic standing balance/coordination, gait training, and improved activity tolerance. Pt stood at sink and donned brief with max A. Applied lotions and donned pull over shirt and pants sitting in WC with set up assist. Sit<>stand with supervision to pull pants over hips. Donned socks and shoes with set up assist and NT present to assess vitals: BP sitting in WC without abdominal binder: 127/66; pt asymptomatic. Donned abdominal binder with max A and pt transported to 4W therapy gym in [facility] total A for time management purposes. Pt ambulated 115ft, 125ft, 163ft, 14ft, and 11ft with RW and supervision. Pt requested to stop and sit  due to fatigue in thighs but denied any dizziness/lightheadedness. During rest breaks discussed energy conservation strategies and safety at home as pt will need to be mod I. Worked on dynamic standing balance and coordination tossing ball against rebounder for [employer] and x20 reps with min A for balance. Pt able to pick up ball from floor without AD and min A but demonstrates overall unsteadiness when standing without AD. Pt transported back to room in [facility] total A and ambulated 19ft with RW and supervision to recliner. Pt began shivering and teeth chattering and reported being "freezing cold" at conclusion of session. Provided pt with heat packs and multiple blankets and notified RN and NT. Concluded session with pt sitting in recliner, needs within reach, and chair pad alarm on.   Treatment Session 2: 1751-0258 55 min Received pt sitting in recliner, pt agreeable to therapy, and denied any pain during session but reported fatigue from previous therapies. Session with focus on functional mobility/transfers, generalized strengthening, dynamic standing balance/coordination, gait training, and improved activity tolerance. Pt ambulated 33ft with RW and supervision to [facility] and performed WC mobility 147ft using BUE and supervision with increased time with 2 rest breaks in between. Pt stated "I could fall asleep right now I'm so tired" but motivated to participate in session. In rehab apartment pt performed ambulatory furniture transfers onto recliner and couch with RW and CGA but reports not having any low sitting furniture at home. Pt transported to dayroom in [facility] total A and transferred onto Nustep with RW and supervision. Pt performed BUE/LE strengthening on Nustep at  workload 4 for 8 minutes for a total of 419 steps for improved cardiovascular endurance. Pt required multiple rest/water breaks throughout session due to fatigue. Pt then ambulated 21ft x 1 and 128ft x 1 with RW and supervision; limited by fatigue in LEs. Pt  transported back to room in [facility] total A and reported urge to urinate. Stand<>pivot to bedside commode with RW and supervision. Pt able to manage clothing, void, and perform peri-care with supervision. Sit<>supine with supervision. Concluded session with pt supine in bed, needs within reach, and bed alarm on.   Therapy Documentation Precautions:  Precautions Precautions: Fall Precaution Comments: orthostatic hypotension, B LE ACE wraps, Abdominal binder as approriate Restrictions Weight Bearing Restrictions: No LUE Weight Bearing: Weight bearing as tolerated  Therapy/Group: Individual Therapy Alfonse Alpers PT, DPT   01/04/2021, 7:17 AM

## 2021-01-04 NOTE — Progress Notes (Signed)
Occupational Therapy Session Note  Patient Details  Name: Kayla Harrison MRN: [id number] Date of Birth: [birth year]  Today's Date: 01/04/2021 OT Individual Time: 3953-2023 OT Individual Time Calculation (min): 48 min    Short Term Goals: Week 2:  OT Short Term Goal 1 (Week 2): STTG = LTGs due to remaining LOS  Skilled Therapeutic Interventions/Progress Updates:    Pt received semi-reclined in bed with MD present, agreeable to therapy. Provided therapy schedule for the day as pt reports not having received hers. Denies pain/dizziness throughout session. Completes bed mobility with use of bed features and distant S. Amb to and from [facility] x2 with min hand held A, close S for seated pericare and LB clothing management. Cont void of b/b, see flowsheets for details. Additionally assessed orthostatics, see flowsheets. With set-up A at sink, pt doffed bra/shirt and bathed UB. Hand-off to PT to cont with LBD.  Pt left with PT present and all immediate needs met.  Therapy Documentation Precautions:  Precautions Precautions: Fall Precaution Comments: orthostatic hypotension, B LE ACE wraps, Abdominal binder as approriate Restrictions Weight Bearing Restrictions: No LUE Weight Bearing: Weight bearing as tolerated Pain: denies   ADL: See Care Tool for more details.   Therapy/Group: Individual Therapy  Volanda Napoleon MS, OTR/L  01/04/2021, 6:40 AM

## 2021-01-05 MED ORDER — CEPHALEXIN 500 MG PO CAPS
500.0000 mg | ORAL_CAPSULE | Freq: Two times a day (BID) | ORAL | Status: DC
  Administered 2021-01-05 – 2021-01-08 (×7): 500 mg via ORAL
  Filled 2021-01-05 (×7): qty 1

## 2021-01-05 NOTE — Progress Notes (Signed)
PROGRESS NOTE   Subjective/Complaints: Walked 900 feet yesterday! She is very proud of her progress. She may be modI in room.  Plan for d/c home Tuesday- her friends will be able to provide some support.   ROS:  Pt denies CP, SOB, N/V/D, dizziness improved  Objective:   No results found. No results for input(s): WBC, HGB, HCT, PLT in the last 72 hours. No results for input(s): NA, K, CL, CO2, GLUCOSE, BUN, CREATININE, CALCIUM in the last 72 hours.  Intake/Output Summary (Last 24 hours) at 01/05/2021 1057 Last data filed at 01/05/2021 0800 Gross per 24 hour  Intake 720 ml  Output --  Net 720 ml        Physical Exam: Vital Signs Blood pressure (!) 146/68, pulse 69, temperature 98.4 F (36.9 C), temperature source Oral, resp. rate 16, height 5\' 6"  (1.676 m), weight 96.3 kg, SpO2 94 %. Gen: no distress, normal appearing HEENT: oral mucosa pink and moist, NCAT Cardio: Reg rate Chest: normal effort, normal rate of breathing Abd: soft, non-distended Ext: no edema Psych: pleasant, normal affect  Musculoskeletal:  General: Swellingand deformitypresent.  Cervical back: Normal range of motion.  Right lower leg: Edemapresent.  Left lower leg: Edemapresent.  Skin: Findings: Bruisingand erythemapresent. Hip incision CDI 4/9 small amt serosang fluid on telfa L hip, ecchymosis is 60% less than on photo below    MS: RUE 5/5 prox to distal. LUE 4-/5 with limitations at elbow d/t pain. LUE 2/5 prox to 4/5 distally d/t pain. RLE 3+ prox to 4+/5 distally. No focal sensory findings. SIt to stand with sup    Assessment/Plan: 1. Functional deficits which require 3+ hours per day of interdisciplinary therapy in a comprehensive inpatient rehab setting.  Physiatrist is providing close team supervision and 24 hour management of active medical problems listed below.  Physiatrist and rehab team continue to  assess barriers to discharge/monitor patient progress toward functional and medical goals  Care Tool:  Bathing    Body parts bathed by patient: Right arm,Left arm,Chest,Abdomen   Body parts bathed by helper: Buttocks     Bathing assist Assist Level: Set up assist     Upper Body Dressing/Undressing Upper body dressing   What is the patient wearing?: Pull over shirt,Bra    Upper body assist Assist Level: Set up assist    Lower Body Dressing/Undressing Lower body dressing      What is the patient wearing?: Incontinence brief,Pants     Lower body assist Assist for lower body dressing: Contact Guard/Touching assist     Toileting Toileting    Toileting assist Assist for toileting: Supervision/Verbal cueing     Transfers Chair/bed transfer  Transfers assist  Chair/bed transfer activity did not occur: Safety/medical concerns (orthostatic on eval)  Chair/bed transfer assist level: Supervision/Verbal cueing     Locomotion Ambulation   Ambulation assist   Ambulation activity did not occur: Safety/medical concerns (orthostatic on eval)  Assist level: Supervision/Verbal cueing Assistive device: Walker-rolling Max distance: 149ft   Walk 10 feet activity   Assist  Walk 10 feet activity did not occur: Safety/medical concerns  Assist level: Supervision/Verbal cueing Assistive device: Walker-rolling   Walk 50 feet activity  Assist Walk 50 feet with 2 turns activity did not occur: Safety/medical concerns  Assist level: Supervision/Verbal cueing Assistive device: Walker-rolling    Walk 150 feet activity   Assist Walk 150 feet activity did not occur: Safety/medical concerns  Assist level: Supervision/Verbal cueing Assistive device: Walker-rolling    Walk 10 feet on uneven surface  activity   Assist Walk 10 feet on uneven surfaces activity did not occur: Safety/medical concerns         Wheelchair     Assist Will patient use wheelchair at  discharge?: No (Per PT long term goals)   Wheelchair activity did not occur: Safety/medical concerns (orthostatic on eval)  Wheelchair assist level: Supervision/Verbal cueing Max wheelchair distance: 7'    Wheelchair 50 feet with 2 turns activity    Assist    Wheelchair 50 feet with 2 turns activity did not occur: Safety/medical concerns   Assist Level: Supervision/Verbal cueing   Wheelchair 150 feet activity     Assist  Wheelchair 150 feet activity did not occur: Safety/medical concerns       Blood pressure (!) 146/68, pulse 69, temperature 98.4 F (36.9 C), temperature source Oral, resp. rate 16, height 5\' 6"  (1.676 m), weight 96.3 kg, SpO2 94 %.  Medical Problem List and Plan: 1.Functional deficitssecondary to left coronoid process fracture and left hip labral and hamstring tendon tears with associated large hematoma which required I&D, vac -patient maynot yetshower- due to orthostatic hypotension  Continue CIR -ELOS/Goals: 14 days, mod I to supervision with PT and OT 2. Antithrombotics: -DVT/anticoagulation:Pharmaceutical:Transitioned to Eliquis.  -antiplatelet therapy: N/A 3. Pain Management: d/c oxycodone. Continue tylenol PRN 4. Mood:LCSW to follow for evaluation and support. -antipsychotic agents:  5. Neuropsych: This patientiscapable of making decisions on herown behalf. 6. Skin/Wound Care:RUE hematoma and LLE hematoma healing well. Messaged SW regarding whether patient can get home wound care to help with LLE.   7. Fluids/Electrolytes/Nutrition:Monitor I/O. Lytes have been stable, repeat PRN.  8. CAF: Monitor HR tid--continue amiodarone 9. Hypothyroid: On supplement 10. Orthostatic hypotension:   4/15: much improved, continue midodrine and florinef- will be weaned by Dr. Caryl Comes 11. H/o CVA with residual mild aphasia: On Crestor and Eliquis 12. OIC: Has not had BM since  admission --augment bowel regimen  4/2- will give Mg citrate- had at least 2 BMs today- will monitor- also was manually disimimpacted- will also increase bowel meds  4/3- had many BMs- feeling much better- con't bowel meds 13. Acute blood loss anemia:   Hgb up to 9.3- iron supplement started.  14. Pre-renal azotemia: BUN up to 25-->push pofluid intake  4/2- same- will push fluids  4/3- BUN down to 22- con't to push fluids PO 15. Leucocytosis: WBC has varied since surgery. Will monitor for fevers and other signs of infection.  --recheck white count in am.   4/2- stable- will monitor for infection.   4/3- down to 10k- con't to monitor 16. Left foot pain: ordered XR to assess for fracture given her fall. 17. Lymphedema: continue garments as much as tolerated; may remove if painful.  18. Brittle nails: Zinc supplement ordered while in hospital. Recommended 4 [place] nuts per day at home.  19. Osteopenia: discussed risks of calcium supplement- she may refuse. Discussed high calcium foods, pharmacologic options.  20. Hypothyroidism: continue synthroid 68.41mcg weekly. 21. Insomnia: melatonin 3mg  HS started 22. Dysuria: UA/UC ordered. + for UTI. Restarted Keflex 500mg  q12H 7 days 23. Hypoalbuminema: brought nuts for her which she liked, will bring more tomorrow.  24. Disposition: d/c on 4/19. F/u with me on 01/18/21 to arrive in office at 9:20 for 9:40        LOS: 14 days A FACE TO [place] 01/05/2021, 10:57 AM

## 2021-01-05 NOTE — Progress Notes (Signed)
Occupational Therapy Session Note  Patient Details  Name: Kayla Harrison MRN: [id number] Date of Birth: [birth year]  Today's Date: 01/05/2021 OT Individual Time: 3383-2919 OT Individual Time Calculation (min): 53 min    Short Term Goals: Week 2:  OT Short Term Goal 1 (Week 2): STTG = LTGs due to remaining LOS  Skilled Therapeutic Interventions/Progress Updates:    Pt received semi-reclined in bed, denies pain/dizziness throughout session, agreeable to therapy. Completes bed mobility with close S and use of bed rail. STS with RW and amb to and from [facility] x2 with cont void of b/b with close S for all toileting tasks. Completed UBD/UBB set-up A, LBD with min A to thread RLE into brief, and LBB and oral care while standing with close S. Amb transfer to recliner with close S. Donned B shoes with set-up A and increased time. Pt with questions re being cleared to shower, will confirm with MD. Rec pt cont to use home shower chair upon clearance to shower and DC home vs standing.    Pt left in recliner with chair alarm engaged, call bell in reach, and all immediate needs met.    Therapy Documentation Precautions:  Precautions Precautions: Fall Precaution Comments: orthostatic hypotension, B LE ACE wraps, Abdominal binder as approriate Restrictions Weight Bearing Restrictions: No LUE Weight Bearing: Weight bearing as tolerated Pain:   denies ADL: See Care Tool for more details.   Therapy/Group: Individual Therapy  Volanda Napoleon MS, OTR/L  01/05/2021, 6:47 AM

## 2021-01-05 NOTE — Progress Notes (Signed)
Physical Therapy Session Note  Patient Details  Name: Kayla Harrison MRN: [id number] Date of Birth: Aug 10, 1946  Today's Date: 01/05/2021 PT Individual Time: 3729-0211 and 1500-1525 PT Individual Time Calculation (min): 38 min and 25 min  Short Term Goals: Week 1:  PT Short Term Goal 1 (Week 1): Patient will perform bed mobility with supervision consistently. PT Short Term Goal 1 - Progress (Week 1): Met PT Short Term Goal 2 (Week 1): Patient will perform basic transfers with CGA using LRAD. PT Short Term Goal 2 - Progress (Week 1): Met PT Short Term Goal 3 (Week 1): Patient will perform standing tolerace without symptoms >4 min. PT Short Term Goal 3 - Progress (Week 1): Met PT Short Term Goal 4 (Week 1): Patient will ambulate >20 ft using LRAD with min A. PT Short Term Goal 4 - Progress (Week 1): Met Week 2:  PT Short Term Goal 1 (Week 2): STG=LTG due to LOS  Skilled Therapeutic Interventions/Progress Updates:   Treatment Session 1: 1115-1153 38 min Received pt supine in bed asleep, upon wakening pt agreeable to therapy, and denied any pain during session. Pt reports continued loose stool and having UTI, starting antibiotics this morning. Session with focus on functional mobility/transfers, toileting, generalized strengthening, dynamic standing balance/coordination, gait training, and improved activity tolerance. Pt transferred supine<>sitting EOB with supervision and donned abdominal binder with max A. Donned socks and shoes with set up assist and ambulated 17ft with RW and supervision to commode. Pt able to manage clothing, void and with loose stool, and perform peri-care with supervision. Pt stood at sink and washed hands with supervision. Pt ambulated 256ft x 1 and 136ft x 2 trials with RW and supervision; limited by LE fatigue but denied any dizziness. Pt transported back to room in [facility] total A and ambulated 78ft with RW and supervision to recliner. Concluded session with pt sitting in  recliner with all needs within reach.  Treatment Session 2: 1552-0802 25 min Received pt sitting in recliner asleep, upon wakening pt agreeable to therapy, and denied any pain during session. Session with focus on functional mobility/transfers, toileting, generalized strengthening, dynamic standing balance/coordination, gait training, and improved activity tolerance. Abdominal binder donned throughout session. Pt ambulated 24ft with RW and supervision to commode and able to manage clothing, void, and perform peri-care with supervision. Pt stood at sink and washed hands with supervision and ambulated 270ft x 1 and 12ft x 2 trials with RW and supervision. Pt reported increased soreness in bilateral thighs but remains motivated to continue. Problem solved getting into front door with plans to pin screen door back for easier access with AD. Concluded session with pt sitting in recliner with all needs within reach.   Therapy Documentation Precautions:  Precautions Precautions: Fall Precaution Comments: orthostatic hypotension, B LE ACE wraps, Abdominal binder as approriate Restrictions Weight Bearing Restrictions: No LUE Weight Bearing: Weight bearing as tolerated  Therapy/Group: Individual Therapy Alfonse Alpers PT, DPT   01/05/2021, 7:28 AM

## 2021-01-05 NOTE — Progress Notes (Signed)
Physical Therapy Session Note  Patient Details  Name: Kayla Harrison MRN: [id number] Date of Birth: 05/02/1946  Today's Date: 01/05/2021 PT Individual Time: 1305-1405 PT Individual Time Calculation (min): 60 min   Short Term Goals: Week 2:  PT Short Term Goal 1 (Week 2): STG=LTG due to LOS  Skilled Therapeutic Interventions/Progress Updates:  Patient seated upright on EOB upon PT arrival to room. Patient alert and agreeable to PT session. Patient denied pain during session. Session performed outside front entrance to hospital in order to assess ability to mobilize over uneven surfaces.   Therapeutic Activity: Transfers: Patient performed STS and SPVT transfers to from various surfaces and heights throughout session with supervision. Minimal vc provided for safety awareness of w/c and RW placement.  Gait Training:  Patient ambulated up and down graded surface using RW with supervision covering 85 feet in each direction. Requires seated rest break after each bout. Demonstrates ability to control walker and speed during downhill gradient and is able to smoothly push RW over uneven surface and uphill. Pt is also able to cover 100 foot distance and manage RW over changes in surface from outdoor to indoor and over floor mats with no catch of walker. Provided verbal cues for technique in unweighting walker.   Patient seated in recliner at end of session with brakes locked, BLE elevated, chair alarm set, and all needs within reach.     Therapy Documentation Precautions:  Precautions Precautions: Fall Precaution Comments: orthostatic hypotension, B LE ACE wraps, Abdominal binder as approriate Restrictions Weight Bearing Restrictions: No LUE Weight Bearing: Weight bearing as tolerated  Therapy/Group: Individual Therapy  Alger Simons PT, DPT 01/05/2021, 5:37 PM

## 2021-01-05 NOTE — Progress Notes (Signed)
Patient ID: Kayla Harrison, female   DOB: [birth year], 75 y.o.   MRN: [id number]   Pt accepted by [facility].  Orders sent  Darrol Angel [phone]

## 2021-01-06 MED ORDER — COVID-19 MRNA VACC (MODERNA) 50 MCG/0.25ML IM SUSP
0.2500 mL | Freq: Once | INTRAMUSCULAR | Status: AC
  Administered 2021-01-06: 0.25 mL via INTRAMUSCULAR
  Filled 2021-01-06: qty 0.25

## 2021-01-06 MED ORDER — LIDOCAINE 5 % EX PTCH
1.0000 | MEDICATED_PATCH | CUTANEOUS | Status: DC
  Administered 2021-01-06: 1 via TRANSDERMAL
  Filled 2021-01-06 (×3): qty 1

## 2021-01-06 MED ORDER — FUROSEMIDE 40 MG PO TABS
40.0000 mg | ORAL_TABLET | Freq: Once | ORAL | Status: AC
  Administered 2021-01-06: 40 mg via ORAL
  Filled 2021-01-06: qty 1

## 2021-01-06 MED ORDER — FLUDROCORTISONE ACETATE 0.1 MG PO TABS
0.1000 mg | ORAL_TABLET | Freq: Two times a day (BID) | ORAL | Status: DC
  Administered 2021-01-06 – 2021-01-08 (×4): 0.1 mg via ORAL
  Filled 2021-01-06 (×6): qty 1

## 2021-01-06 NOTE — Progress Notes (Signed)
PROGRESS NOTE   Subjective/Complaints: Complains of some right sided rib pain- lidocaine patch ordered Agree with florinef decrease- BP much better controlled.  Hematoma pain well controlled She would like to go outside today with her friend.   ROS:  Pt denies CP, SOB, N/V/D, dizziness improved  Objective:   No results found. No results for input(s): WBC, HGB, HCT, PLT in the last 72 hours. No results for input(s): NA, K, CL, CO2, GLUCOSE, BUN, CREATININE, CALCIUM in the last 72 hours.  Intake/Output Summary (Last 24 hours) at 01/06/2021 1131 Last data filed at 01/06/2021 0829 Gross per 24 hour  Intake 598 ml  Output --  Net 598 ml        Physical Exam: Vital Signs Blood pressure (!) 142/73, pulse 70, temperature 97.8 F (36.6 C), temperature source Oral, resp. rate 12, height 5\' 6"  (1.676 m), weight 96.3 kg, SpO2 96 %. Gen: no distress, normal appearing HEENT: oral mucosa pink and moist, NCAT Cardio: Reg rate Chest: normal effort, normal rate of breathing Abd: soft, non-distended Ext: no edema Psych: pleasant, normal affect Skin: intact  Musculoskeletal:  General: Swellingand deformitypresent.  Cervical back: Normal range of motion.  Right lower leg: Edemapresent.  Left lower leg: Edemapresent.  Skin: Findings: Bruisingand erythemapresent. Hip incision CDI 4/9 small amt serosang fluid on telfa L hip, ecchymosis is 60% less than on photo below    MS: RUE 5/5 prox to distal. LUE 4-/5 with limitations at elbow d/t pain. LUE 2/5 prox to 4/5 distally d/t pain. RLE 3+ prox to 4+/5 distally. No focal sensory findings. SIt to stand with sup    Assessment/Plan: 1. Functional deficits which require 3+ hours per day of interdisciplinary therapy in a comprehensive inpatient rehab setting.  Physiatrist is providing close team supervision and 24 hour management of active medical problems  listed below.  Physiatrist and rehab team continue to assess barriers to discharge/monitor patient progress toward functional and medical goals  Care Tool:  Bathing    Body parts bathed by patient: Right arm,Left arm,Chest,Abdomen,Front perineal area,Right upper leg,Left upper leg,Right lower leg,Left lower leg,Buttocks,Face   Body parts bathed by helper: Buttocks     Bathing assist Assist Level: Supervision/Verbal cueing     Upper Body Dressing/Undressing Upper body dressing   What is the patient wearing?: Pull over shirt    Upper body assist Assist Level: Set up assist    Lower Body Dressing/Undressing Lower body dressing      What is the patient wearing?: Incontinence brief,Pants     Lower body assist Assist for lower body dressing: Supervision/Verbal cueing     Toileting Toileting    Toileting assist Assist for toileting: Supervision/Verbal cueing     Transfers Chair/bed transfer  Transfers assist  Chair/bed transfer activity did not occur: Safety/medical concerns (orthostatic on eval)  Chair/bed transfer assist level: Supervision/Verbal cueing     Locomotion Ambulation   Ambulation assist   Ambulation activity did not occur: Safety/medical concerns (orthostatic on eval)  Assist level: Supervision/Verbal cueing Assistive device: Walker-rolling Max distance: 278ft   Walk 10 feet activity   Assist  Walk 10 feet activity did not occur: Safety/medical concerns  Assist level:  Supervision/Verbal cueing Assistive device: Walker-rolling   Walk 50 feet activity   Assist Walk 50 feet with 2 turns activity did not occur: Safety/medical concerns  Assist level: Supervision/Verbal cueing Assistive device: Walker-rolling    Walk 150 feet activity   Assist Walk 150 feet activity did not occur: Safety/medical concerns  Assist level: Supervision/Verbal cueing Assistive device: Walker-rolling    Walk 10 feet on uneven surface   activity   Assist Walk 10 feet on uneven surfaces activity did not occur: Safety/medical concerns   Assist level: Supervision/Verbal cueing Assistive device: Aeronautical engineer Will patient use wheelchair at discharge?: No (Per PT long term goals)   Wheelchair activity did not occur: Safety/medical concerns (orthostatic on eval)  Wheelchair assist level: Supervision/Verbal cueing Max wheelchair distance: 44'    Wheelchair 50 feet with 2 turns activity    Assist    Wheelchair 50 feet with 2 turns activity did not occur: Safety/medical concerns   Assist Level: Supervision/Verbal cueing   Wheelchair 150 feet activity     Assist  Wheelchair 150 feet activity did not occur: Safety/medical concerns       Blood pressure (!) 142/73, pulse 70, temperature 97.8 F (36.6 C), temperature source Oral, resp. rate 12, height 5\' 6"  (1.676 m), weight 96.3 kg, SpO2 96 %.  Medical Problem List and Plan: 1.Functional deficitssecondary to left coronoid process fracture and left hip labral and hamstring tendon tears with associated large hematoma which required I&D, vac -patient maynot yetshower- due to orthostatic hypotension  Continue CIR -ELOS/Goals: 14 days, mod I to supervision with PT and OT 2. Antithrombotics: -DVT/anticoagulation:Pharmaceutical:Transitioned to Eliquis.  -antiplatelet therapy: N/A 3. Pain from LLE hematoma, right sided rib pain: d/c oxycodone. Continue tylenol PRN. Add lidocaine patch for rib pain.  4. Mood:LCSW to follow for evaluation and support. -antipsychotic agents:  5. Neuropsych: This patientiscapable of making decisions on herown behalf. 6. Skin/Wound Care:RUE hematoma and LLE hematoma healing well. Messaged SW regarding whether patient can get home wound care to help with LLE.   7. Fluids/Electrolytes/Nutrition:Monitor I/O. Lytes have been stable, repeat PRN.   8. CAF: Monitor HR tid--continue amiodarone 9. Hypothyroid: On supplement 10. Orthostatic hypotension: improved, decrease florinef to 0.1mg  BID 11. H/o CVA with residual mild aphasia: On Crestor and Eliquis 12. OIC: Has not had BM since admission --augment bowel regimen  4/2- will give Mg citrate- had at least 2 BMs today- will monitor- also was manually disimimpacted- will also increase bowel meds  4/3- had many BMs- feeling much better- con't bowel meds 13. Acute blood loss anemia:   Hgb up to 9.3- iron supplement started.  14. Pre-renal azotemia: BUN up to 25-->push pofluid intake  4/2- same- will push fluids  4/3- BUN down to 22- con't to push fluids PO 15. Leucocytosis: WBC has varied since surgery. Will monitor for fevers and other signs of infection.  --recheck white count in am.   4/2- stable- will monitor for infection.   4/3- down to 10k- con't to monitor 16. Left foot pain: ordered XR to assess for fracture given her fall. 17. Lymphedema: continue garments as much as tolerated; may remove if painful.  18. Brittle nails: Zinc supplement ordered while in hospital. Recommended 4 [place] nuts per day at home.  19. Osteopenia: discussed risks of calcium supplement- she may refuse. Discussed high calcium foods, pharmacologic options.  20. Hypothyroidism: continue synthroid 68.49mcg weekly. 21. Insomnia: melatonin 3mg  HS started 22. Dysuria: UA/UC ordered. + for  UTI. Restarted Keflex 500mg  q12H 7 days. Discussed that UC shows multiple species, will recollect today.  23. Hypoalbuminema: brought nuts for her which she liked, will bring more tomorrow.  24. Preventative care: COVID Booster ordered.  25. Disposition: d/c on 4/19. F/u with me on 01/18/21 to arrive in office at 9:20 for 9:40        LOS: 15 days A FACE TO [place] 01/06/2021, 11:31 AM

## 2021-01-06 NOTE — Plan of Care (Signed)
  Problem: Consults Goal: Skin Care Protocol Initiated - if Braden Score 18 or less Description: If consults are not indicated, leave blank or document N/A Outcome: Progressing   Problem: RH BOWEL ELIMINATION Goal: RH STG MANAGE BOWEL WITH ASSISTANCE Description: STG Manage Bowel with mod I Assistance. Outcome: Progressing   Problem: RH BLADDER ELIMINATION Goal: RH STG MANAGE BLADDER WITH ASSISTANCE Description: STG Manage Bladder With mod I Assistance Outcome: Progressing   Problem: RH SKIN INTEGRITY Goal: RH STG MAINTAIN SKIN INTEGRITY WITH ASSISTANCE Description: STG Maintain Skin Integrity With MOD I Assistance. Outcome: Progressing   Problem: RH SAFETY Goal: RH STG ADHERE TO SAFETY PRECAUTIONS W/ASSISTANCE/DEVICE Description: STG Adhere to Safety Precautions With cues and reminders Outcome: Progressing   Problem: RH PAIN MANAGEMENT Goal: RH STG PAIN MANAGED AT OR BELOW PT'S PAIN GOAL Description: Pain level less than 4 on scale of 0-10 Outcome: Progressing   Problem: Consults Goal: RH GENERAL PATIENT EDUCATION Description: See Patient Education module for education specifics. Outcome: Progressing

## 2021-01-06 NOTE — Progress Notes (Signed)
Patient c/o dull right lower rib pain, scale 2/10. Refused pain medication. Will continue to monitor.

## 2021-01-06 NOTE — Progress Notes (Signed)
Occupational Therapy Session Note  Patient Details  Name: Kayla Harrison MRN: [id number] Date of Birth: 1946/09/16  Today's Date: 01/06/2021 OT Individual Time: 4665-9935 OT Individual Time Calculation (min): 45 min    Short Term Goals: Week 1:  OT Short Term Goal 1 (Week 1): Pt will complete toilet transfer with 1 assist and LRAD OT Short Term Goal 1 - Progress (Week 1): Met OT Short Term Goal 2 (Week 1): Pt will complete LB dressing at sit<stand level to increase standing tolerance OT Short Term Goal 2 - Progress (Week 1): Met OT Short Term Goal 3 (Week 1): Pt will complete 1 grooming task while sitting at the sink to increase OOB tolerance OT Short Term Goal 3 - Progress (Week 1): Met Week 2:  OT Short Term Goal 1 (Week 2): STTG = LTGs due to remaining LOS  Skilled Therapeutic Interventions/Progress Updates:    1;1. Pt received in bed agreeable to OT. Pt completes sup>sit and mobility with RW with supervision. Pt completes UB bathing at sink with setup and LB bathing with sueprvision only for peri area by pt choice. Pt reporting rib pain and OT suggests heat or ice pack but pt politely declines. Pt dons shirt with set up. Pants socks and shoes donned with supervision leaning forward towards seat. Pt declines ace/teds/abdominal binder. Pt completes grooming standing with set up. Functional mobility in hallway 1x>300 feet with RW with VC for relaxed shoulders, upright posture and looking ahead. Pt requires seated rest before return to recliner with all needs met and call light tin reach.   Therapy Documentation Precautions:  Precautions Precautions: Fall Precaution Comments: orthostatic hypotension, B LE ACE wraps, Abdominal binder as approriate Restrictions Weight Bearing Restrictions: No LUE Weight Bearing: Weight bearing as tolerated General:   Vital Signs: Therapy Vitals Temp: 97.8 F (36.6 C) Temp Source: Oral Pulse Rate: 70 Resp: 12 BP: (!) 142/73 Patient Position  (if appropriate): Lying Oxygen Therapy SpO2: 96 % Pain:   ADL: ADL Eating: Not assessed Grooming: Minimal assistance Where Assessed-Grooming: Edge of bed Upper Body Bathing: Supervision/safety Where Assessed-Upper Body Bathing: Edge of bed Lower Body Bathing: Dependent (+2 assist) Where Assessed-Lower Body Bathing: Edge of bed,Bed level Upper Body Dressing: Moderate assistance Where Assessed-Upper Body Dressing: Bed level Lower Body Dressing: Dependent (+2 assist) Where Assessed-Lower Body Dressing: Edge of bed,Bed level Toileting: Dependent (+2 assist) Where Assessed-Toileting: Bed level Toilet Transfer: Not assessed Tub/Shower Transfer: Not assessed Vision   Perception    Praxis   Exercises:   Other Treatments:     Therapy/Group: Individual Therapy  Tonny Branch 01/06/2021, 6:48 AM

## 2021-01-06 NOTE — Progress Notes (Signed)
Progress Note  Patient Name: Kayla Harrison Date of Encounter: 01/06/2021  [facility] Cardiologist: [name], MD   Subjective   Pt denies dizziness   Says her BP has been better on new meds   Does note some SOB when she lays down   Inpatient Medications    Scheduled Meds: . amiodarone  200 mg Oral Daily  . apixaban  5 mg Oral BID  . cephALEXin  500 mg Oral Q12H  . ferrous sulfate  325 mg Oral Q breakfast  . fludrocortisone  0.2 mg Oral BID WC  . levothyroxine  137 mcg Oral Once per day on Sun Mon Tue Wed Thu Fri  . levothyroxine  68.5 mcg Oral Once per day on Sat  . midodrine  5 mg Oral BID WC  . multivitamin with minerals  1 tablet Oral Daily  . rosuvastatin  10 mg Oral Once per day on Mon Thu   Continuous Infusions:  PRN Meds: acetaminophen, alum & mag hydroxide-simeth, diphenhydrAMINE, guaiFENesin-dextromethorphan, prochlorperazine **OR** prochlorperazine **OR** prochlorperazine, sodium phosphate, traZODone   Vital Signs    Vitals:   01/05/21 1316 01/05/21 1319 01/05/21 1906 01/06/21 0426  BP: 101/68 108/63 125/62 (!) 142/73  Pulse: 70 75 72 70  Resp:  17 16 12   Temp: 98 F (36.7 C)  98.5 F (36.9 C) 97.8 F (36.6 C)  TempSrc: Oral  Oral Oral  SpO2: 98%  97% 96%  Weight:      Height:        Intake/Output Summary (Last 24 hours) at 01/06/2021 1013 Last data filed at 01/06/2021 0829 Gross per 24 hour  Intake 598 ml  Output --  Net 598 ml   Last 3 Weights 12/25/2020 12/15/2020 12/15/2020  Weight (lbs) 212 lb 4.9 oz 204 lb 207 lb 3.7 oz  Weight (kg) 96.3 kg 92.534 kg 94 kg      Telemetry    None  - Personally Reviewed  ECG    No new - Personally Reviewed  Physical Exam   GEN: No acute distress.   Neck: No JVD Cardiac: RRR, Gr III/VI systolic murmur LSB Respiratory: Clear to auscultation bilaterally. GI: Soft, nontender, non-distended  MS: 1+ edema;  Neuro:  Nonfocal  Psych: Normal affect   Labs    High Sensitivity Troponin:  No  results for input(s): TROPONINIHS in the last 720 hours.    Chemistry Recent Labs  Lab 01/01/21 1624  NA 140  K 4.0  CL 109  CO2 25  GLUCOSE 155*  BUN 18  CREATININE 1.06*  CALCIUM 8.4*  PROT 5.2*  ALBUMIN 2.4*  AST 39  ALT 31  ALKPHOS 81  BILITOT 1.3*  GFRNONAA 54*  ANIONGAP 6     Hematology Recent Labs  Lab 01/01/21 1624  WBC 10.3  RBC 3.00*  HGB 9.2*  HCT 30.1*  MCV 100.3*  MCH 30.7  MCHC 30.6  RDW 21.1*  PLT 214    BNPNo results for input(s): BNP, PROBNP in the last 168 hours.   DDimer No results for input(s): DDIMER in the last 168 hours.   Radiology    No results found.  Cardiac Studies   Echo:  12/03/19  1. Left ventricular ejection fraction, by estimation, is 55 to 60%. The left ventricle has normal function. The left ventricle has no regional wall motion abnormalities. Left ventricular diastolic parameters are consistent with Grade II diastolic dysfunction (pseudonormalization). 2. Right ventricular systolic function is normal. The right ventricular size is normal.  There is mildly elevated pulmonary artery systolic pressure. 3. Right atrial size was mildly dilated. 4. The mitral valve is grossly normal. Mild mitral valve regurgitation. No evidence of mitral stenosis. 5. Tricuspid valve regurgitation is mild to moderate. 6. The aortic valve is normal in structure. Aortic valve regurgitation is not visualized. No aortic stenosis is present.  Patient Profile     75 y.o. female admitted after fall and elbow fx.  Hip bruised  Found to be orhtostatic even lying to sitting   Assessment & Plan    1  Orthostatic hypotension  Pt denies dizziness  BP is good  Will have orthostatics checked She does have 1+ edema   She says she is a little SOB laying flat Will give 1 tab lasix 40    Follow    She is on midodrine now   Would back off a little on florinef to 0.1   Follow     2  Rigors/chils  Pt denies  Check CBC   3  Afib  CHA2DS2Vasc of 6   Pt  with persistent atrial fib   On amiodarone and anticoagulation    For questions or updates, please contact [facility] Please consult [email or web address] for contact info under        Signed, Dorris Carnes, MD  01/06/2021, 10:13 AM

## 2021-01-07 LAB — CBC
HCT: 28.3 % — ABNORMAL LOW (ref 36.0–46.0)
Hemoglobin: 8.9 g/dL — ABNORMAL LOW (ref 12.0–15.0)
MCH: 30.5 pg (ref 26.0–34.0)
MCHC: 31.4 g/dL (ref 30.0–36.0)
MCV: 96.9 fL (ref 80.0–100.0)
Platelets: 240 10*3/uL (ref 150–400)
RBC: 2.92 MIL/uL — ABNORMAL LOW (ref 3.87–5.11)
RDW: 19.9 % — ABNORMAL HIGH (ref 11.5–15.5)
WBC: 7.9 10*3/uL (ref 4.0–10.5)
nRBC: 0 % (ref 0.0–0.2)

## 2021-01-07 LAB — BRAIN NATRIURETIC PEPTIDE: B Natriuretic Peptide: 157 pg/mL — ABNORMAL HIGH (ref 0.0–100.0)

## 2021-01-07 MED ORDER — PHENAZOPYRIDINE HCL 100 MG PO TABS
100.0000 mg | ORAL_TABLET | Freq: Three times a day (TID) | ORAL | Status: AC
  Administered 2021-01-07 – 2021-01-09 (×6): 100 mg via ORAL
  Filled 2021-01-07 (×6): qty 1

## 2021-01-07 MED ORDER — FLUDROCORTISONE ACETATE 0.1 MG PO TABS
0.1000 mg | ORAL_TABLET | Freq: Every day | ORAL | Status: AC
  Administered 2021-01-07: 0.1 mg via ORAL
  Filled 2021-01-07: qty 1

## 2021-01-07 NOTE — Progress Notes (Signed)
PROGRESS NOTE   Subjective/Complaints: C/o waking every hour to urinate- was started on Lasix yesterday by cardiology due to ankle swelling. Ankle swelling appears stable today. Discussed BNP level with her and reviewed Echo from last year with her. Agree with titrating down Florinef as dizziness has resolved and BP slightly high now at rest.   ROS: Pt denies CP, SOB, N/V/D, dizziness improved, +urinary frequency and dysuria  Objective:   No results found. Recent Labs    01/07/21 0502  WBC 7.9  HGB 8.9*  HCT 28.3*  PLT 240   No results for input(s): NA, K, CL, CO2, GLUCOSE, BUN, CREATININE, CALCIUM in the last 72 hours.  Intake/Output Summary (Last 24 hours) at 01/07/2021 0904 Last data filed at 01/07/2021 0725 Gross per 24 hour  Intake 517 ml  Output --  Net 517 ml        Physical Exam: Vital Signs Blood pressure 139/70, pulse 72, temperature 98.2 F (36.8 C), temperature source Oral, resp. rate 18, height 5\' 6"  (1.676 m), weight 96.3 kg, SpO2 95 %. Gen: no distress, normal appearing HEENT: oral mucosa pink and moist, NCAT Cardio: Reg rate Chest: normal effort, normal rate of breathing Abd: soft, non-distended Ext: no edema Psych: pleasant, normal affect Skin: intact Musculoskeletal:  General: Swellingand deformitypresent.  Cervical back: Normal range of motion.  Right lower leg: Edemapresent.  Left lower leg: Edemapresent.  Skin: Findings: Bruisingand erythemapresent. Hip incision CDI 4/9 small amt serosang fluid on telfa L hip, ecchymosis is 60% less than on photo below , continues to improve.    MS: RUE 5/5 prox to distal. LUE 4-/5 with limitations at elbow d/t pain. LUE 2/5 prox to 4/5 distally d/t pain. RLE 4 prox to 4+/5 distally. No focal sensory findings. SIt to stand with sup    Assessment/Plan: 1. Functional deficits which require 3+ hours per day of  interdisciplinary therapy in a comprehensive inpatient rehab setting.  Physiatrist is providing close team supervision and 24 hour management of active medical problems listed below.  Physiatrist and rehab team continue to assess barriers to discharge/monitor patient progress toward functional and medical goals  Care Tool:  Bathing    Body parts bathed by patient: Right arm,Left arm,Chest,Abdomen,Front perineal area,Right upper leg,Left upper leg,Right lower leg,Left lower leg,Buttocks,Face   Body parts bathed by helper: Buttocks     Bathing assist Assist Level: Supervision/Verbal cueing     Upper Body Dressing/Undressing Upper body dressing   What is the patient wearing?: Pull over shirt    Upper body assist Assist Level: Set up assist    Lower Body Dressing/Undressing Lower body dressing      What is the patient wearing?: Incontinence brief,Pants     Lower body assist Assist for lower body dressing: Supervision/Verbal cueing     Toileting Toileting    Toileting assist Assist for toileting: Supervision/Verbal cueing     Transfers Chair/bed transfer  Transfers assist  Chair/bed transfer activity did not occur: Safety/medical concerns (orthostatic on eval)  Chair/bed transfer assist level: Supervision/Verbal cueing     Locomotion Ambulation   Ambulation assist   Ambulation activity did not occur: Safety/medical concerns (orthostatic on eval)  Assist  level: Supervision/Verbal cueing Assistive device: Walker-rolling Max distance: 242ft   Walk 10 feet activity   Assist  Walk 10 feet activity did not occur: Safety/medical concerns  Assist level: Supervision/Verbal cueing Assistive device: Walker-rolling   Walk 50 feet activity   Assist Walk 50 feet with 2 turns activity did not occur: Safety/medical concerns  Assist level: Supervision/Verbal cueing Assistive device: Walker-rolling    Walk 150 feet activity   Assist Walk 150 feet activity  did not occur: Safety/medical concerns  Assist level: Supervision/Verbal cueing Assistive device: Walker-rolling    Walk 10 feet on uneven surface  activity   Assist Walk 10 feet on uneven surfaces activity did not occur: Safety/medical concerns   Assist level: Supervision/Verbal cueing Assistive device: Aeronautical engineer Will patient use wheelchair at discharge?: No (Per PT long term goals)   Wheelchair activity did not occur: Safety/medical concerns (orthostatic on eval)  Wheelchair assist level: Supervision/Verbal cueing Max wheelchair distance: 37'    Wheelchair 50 feet with 2 turns activity    Assist    Wheelchair 50 feet with 2 turns activity did not occur: Safety/medical concerns   Assist Level: Supervision/Verbal cueing   Wheelchair 150 feet activity     Assist  Wheelchair 150 feet activity did not occur: Safety/medical concerns       Blood pressure 139/70, pulse 72, temperature 98.2 F (36.8 C), temperature source Oral, resp. rate 18, height 5\' 6"  (1.676 m), weight 96.3 kg, SpO2 95 %.  Medical Problem List and Plan: 1.Functional deficitssecondary to left coronoid process fracture and left hip labral and hamstring tendon tears with associated large hematoma which required I&D, vac -patient maynot yetshower- due to orthostatic hypotension  Continue CIR -ELOS/Goals: 14 days, mod I to supervision with PT and OT 2. Antithrombotics: -DVT/anticoagulation:Pharmaceutical:Transitioned to Eliquis.  -antiplatelet therapy: N/A 3. Pain from LLE hematoma, right sided rib pain: d/c oxycodone. Continue tylenol PRN. Add lidocaine patch for rib pain.  4. Mood:LCSW to follow for evaluation and support. -antipsychotic agents:  5. Neuropsych: This patientiscapable of making decisions on herown behalf. 6. Skin/Wound Care:RUE hematoma and LLE hematoma healing well. Messaged SW  regarding whether patient can get home wound care to help with LLE.   7. Fluids/Electrolytes/Nutrition:Monitor I/O. Lytes have been stable, repeat PRN.  8. CAF: Monitor HR tid--continue amiodarone 9. Hypothyroid: On supplement 10. Orthostatic hypotension: improved, decrease florinef to 0.1mg  BID 11. H/o CVA with residual mild aphasia: On Crestor and Eliquis 12. OIC: Has not had BM since admission --augment bowel regimen  4/2- will give Mg citrate- had at least 2 BMs today- will monitor- also was manually disimimpacted- will also increase bowel meds  4/3- had many BMs- feeling much better- con't bowel meds 13. Acute blood loss anemia:   Hgb up to 9.3- iron supplement started.  14. Pre-renal azotemia: BUN up to 25-->push pofluid intake  4/2- same- will push fluids  4/3- BUN down to 22- con't to push fluids PO 15. Leucocytosis: Normalized 16. Left foot pain: ordered XR to assess for fracture given her fall. Negative- no more complaints 17. Lymphedema: continue garments as much as tolerated; may remove if painful. Stable today. Reviewed BNP with patient and her Echo from last year (great EF). Continue to wear Florinef as tolerated as per cardiology recommendations 18. Brittle nails: Zinc supplement ordered while in hospital. Recommended 4 [place] nuts per day at home.  19. Osteopenia: discussed risks of calcium supplement- she may refuse. Discussed high calcium foods,  pharmacologic options.  20. Hypothyroidism: continue synthroid 68.73mcg weekly. 21. Insomnia: melatonin 3mg  HS started 22. Dysuria: UA/UC ordered. + for UTI. Restarted Keflex 500mg  q12H 7 days. Discussed that UC shows multiple species, recollected, results pending. Started pyridium x2 days for burning  23. Hypoalbuminema: brought nuts for her which she liked, will bring more tomorrow.  24. Preventative care: Received COVID Booster 4/16. Feeling tired from this today 25. Urinary frequency: likely double crush from Lasix  and UTI: hopefully will not need more Lasix since ankle swelling is stable 25. Disposition: d/c on 4/19. F/u with me on 01/18/21 to arrive in office at 9:20 for 9:40        LOS: 16 days [place] 01/07/2021, 9:04 AM

## 2021-01-07 NOTE — Progress Notes (Signed)
Progress Note  Patient Name: Kayla Harrison Date of Encounter: 01/07/2021  [facility] Cardiologist: [name], MD   Subjective   Pt denies CP   Breathing OK  No dizziness  Inpatient Medications    Scheduled Meds: . amiodarone  200 mg Oral Daily  . apixaban  5 mg Oral BID  . cephALEXin  500 mg Oral Q12H  . ferrous sulfate  325 mg Oral Q breakfast  . fludrocortisone  0.1 mg Oral BID WC  . levothyroxine  137 mcg Oral Once per day on Sun Mon Tue Wed Thu Fri  . levothyroxine  68.5 mcg Oral Once per day on Sat  . lidocaine  1 patch Transdermal Q24H  . midodrine  5 mg Oral BID WC  . multivitamin with minerals  1 tablet Oral Daily  . phenazopyridine  100 mg Oral TID WC  . rosuvastatin  10 mg Oral Once per day on Mon Thu   Continuous Infusions:  PRN Meds: acetaminophen, alum & mag hydroxide-simeth, diphenhydrAMINE, guaiFENesin-dextromethorphan, prochlorperazine **OR** prochlorperazine **OR** prochlorperazine, sodium phosphate, traZODone   Vital Signs    Vitals:   01/06/21 0426 01/06/21 1521 01/06/21 2011 01/07/21 0348  BP: (!) 142/73 128/69 (!) 150/72 139/70  Pulse: 70 71 70 72  Resp: 12 14 18 18   Temp: 97.8 F (36.6 C) 97.9 F (36.6 C) 98.2 F (36.8 C) 98.2 F (36.8 C)  TempSrc: Oral Oral Oral Oral  SpO2: 96% 100% 98% 95%  Weight:      Height:        Intake/Output Summary (Last 24 hours) at 01/07/2021 1032 Last data filed at 01/07/2021 0725 Gross per 24 hour  Intake 517 ml  Output --  Net 517 ml   Last 3 Weights 12/25/2020 12/15/2020 12/15/2020  Weight (lbs) 212 lb 4.9 oz 204 lb 207 lb 3.7 oz  Weight (kg) 96.3 kg 92.534 kg 94 kg      Telemetry    None  - Personally Reviewed  ECG    No new - Personally Reviewed  Physical Exam   GEN: No acute distress.   Neck: No JVD Cardiac: RRR, Gr III/VI systolic murmur LSB  NoS3 Respiratory: Clear to auscultation bilaterally. GI: Soft, nontender, non-distended  MS: Trivial LE  edema;  Neuro:  Nonfocal   Psych: Normal affect   Labs    High Sensitivity Troponin:  No results for input(s): TROPONINIHS in the last 720 hours.    Chemistry Recent Labs  Lab 01/01/21 1624  NA 140  K 4.0  CL 109  CO2 25  GLUCOSE 155*  BUN 18  CREATININE 1.06*  CALCIUM 8.4*  PROT 5.2*  ALBUMIN 2.4*  AST 39  ALT 31  ALKPHOS 81  BILITOT 1.3*  GFRNONAA 54*  ANIONGAP 6     Hematology Recent Labs  Lab 01/01/21 1624 01/07/21 0502  WBC 10.3 7.9  RBC 3.00* 2.92*  HGB 9.2* 8.9*  HCT 30.1* 28.3*  MCV 100.3* 96.9  MCH 30.7 30.5  MCHC 30.6 31.4  RDW 21.1* 19.9*  PLT 214 240    BNP Recent Labs  Lab 01/07/21 0502  BNP 157.0*     DDimer No results for input(s): DDIMER in the last 168 hours.   Radiology    No results found.  Cardiac Studies   Echo:  12/03/19  1. Left ventricular ejection fraction, by estimation, is 55 to 60%. The left ventricle has normal function. The left ventricle has no regional wall motion abnormalities. Left ventricular diastolic  parameters are consistent with Grade II diastolic dysfunction (pseudonormalization). 2. Right ventricular systolic function is normal. The right ventricular size is normal. There is mildly elevated pulmonary artery systolic pressure. 3. Right atrial size was mildly dilated. 4. The mitral valve is grossly normal. Mild mitral valve regurgitation. No evidence of mitral stenosis. 5. Tricuspid valve regurgitation is mild to moderate. 6. The aortic valve is normal in structure. Aortic valve regurgitation is not visualized. No aortic stenosis is present.  Patient Profile     75 y.o. female admitted after fall and elbow fx.  Hip bruised  Found to be orhtostatic even lying to sitting   Assessment & Plan    1  Orthostatic hypotension Pt's BP is better   I recomm backing down on florinef.   Really this drug should be given at 7 or 8 am and then the last dose at 2 PM   This avoids Hypertension when supine   (more difficult in hospital  setting) For midodrine, again, early am--late am--mid afternoon (Ex 7/11/2)   Not at true tid schedule   Minimizes HTN while supine Pt does says she feels better than 1 wk ago    The pt appears to have responded to lasix   Upset that she urinated all night   I/O and exam sugg she was way ahead. She has it as a prn at home          2  Rigors/chils  Pt denies   WBC normal 7.9  3  Anemai  H/H rel steady from 1 week agoa   8.9     3  Afib  CHA2DS2Vasc of 6   Pt with persistent atrial fib   Clinically OK    On amiodarone and anticoagulation    For questions or updates, please contact [facility] Please consult [email or web address] for contact info under        Signed, Dorris Carnes, MD  01/07/2021, 10:32 AM

## 2021-01-07 NOTE — Progress Notes (Signed)
Patient on keflex for UTI. Voiding about every hour. Patient reporting some burning with urination.

## 2021-01-07 NOTE — Plan of Care (Signed)
  Problem: Consults Goal: Skin Care Protocol Initiated - if Braden Score 18 or less Description: If consults are not indicated, leave blank or document N/A Outcome: Progressing   Problem: RH BOWEL ELIMINATION Goal: RH STG MANAGE BOWEL WITH ASSISTANCE Description: STG Manage Bowel with mod I Assistance. Outcome: Progressing   Problem: RH BLADDER ELIMINATION Goal: RH STG MANAGE BLADDER WITH ASSISTANCE Description: STG Manage Bladder With mod I Assistance Outcome: Progressing   Problem: RH SKIN INTEGRITY Goal: RH STG MAINTAIN SKIN INTEGRITY WITH ASSISTANCE Description: STG Maintain Skin Integrity With MOD I Assistance. Outcome: Progressing   Problem: RH SAFETY Goal: RH STG ADHERE TO SAFETY PRECAUTIONS W/ASSISTANCE/DEVICE Description: STG Adhere to Safety Precautions With cues and reminders Outcome: Progressing   Problem: RH PAIN MANAGEMENT Goal: RH STG PAIN MANAGED AT OR BELOW PT'S PAIN GOAL Description: Pain level less than 4 on scale of 0-10 Outcome: Progressing   Problem: Consults Goal: RH GENERAL PATIENT EDUCATION Description: See Patient Education module for education specifics. Outcome: Progressing

## 2021-01-08 ENCOUNTER — Encounter: Payer: Medicare Other | Admitting: Physical Medicine and Rehabilitation

## 2021-01-08 LAB — URINE CULTURE: Culture: 50000 — AB

## 2021-01-08 MED ORDER — FOSFOMYCIN TROMETHAMINE 3 G PO PACK
3.0000 g | PACK | Freq: Once | ORAL | Status: AC
  Administered 2021-01-08: 3 g via ORAL
  Filled 2021-01-08 (×2): qty 3

## 2021-01-08 MED ORDER — SULFAMETHOXAZOLE-TRIMETHOPRIM 800-160 MG PO TABS: 1.0000 | ORAL_TABLET | Freq: Two times a day (BID) | ORAL | Status: DC

## 2021-01-08 MED ORDER — FLUDROCORTISONE ACETATE 0.1 MG PO TABS
0.0500 mg | ORAL_TABLET | Freq: Two times a day (BID) | ORAL | Status: DC
  Administered 2021-01-09: 0.05 mg via ORAL
  Filled 2021-01-08 (×2): qty 0.5

## 2021-01-08 NOTE — Progress Notes (Signed)
Physical Therapy Discharge Summary  Patient Details  Name: Kayla Harrison MRN: [id number] Date of Birth: [birth year]  Patient has met 10 of 10 long term goals due to improved activity tolerance, improved balance, improved postural control, increased strength, decreased pain, improved awareness and improved coordination. Patient to discharge at an ambulatory level Supervision/Mod I. Pt's family did not attend family education training. However, pt demonstrates good understanding of signs/symptoms of syncope and demonstrates good safety awareness. Pt also reports she will have a friend staying with her for a few days upon discharge.   All goals met   Recommendation:  Patient will benefit from ongoing skilled PT services in home health setting to continue to advance safe functional mobility, address ongoing impairments in transfers, generalized strengthening, dynamic standing balance/coordination, ambulation, endurance, and to minimize fall risk.  Equipment: No equipment provided; pt has rollator at home  Reasons for discharge: treatment goals met  Patient/family agrees with progress made and goals achieved: Yes  PT Discharge Precautions/Restrictions Precautions Precautions: Fall Precaution Comments: monitor BP; abdominal binder as appropriate Restrictions Weight Bearing Restrictions: No Cognition Overall Cognitive Status: Within Functional Limits for tasks assessed Arousal/Alertness: Awake/alert Orientation Level: Oriented X4 Memory: Appears intact Awareness: Appears intact Problem Solving: Appears intact Safety/Judgment: Appears intact Sensation Sensation Light Touch: Impaired by gross assessment Proprioception: Appears Intact Additional Comments: pt reports neuropathy in BLEs Coordination Gross Motor Movements are Fluid and Coordinated: No Fine Motor Movements are Fluid and Coordinated: Yes Coordination and Movement Description: generalized deconditioning and  weakness Finger Nose Finger Test: [facility] bilaterally Heel Shin Test: [facility] bilaterally Motor  Motor Motor: Other (comment) Motor - Skilled Clinical Observations: generalized weakness and deconditioning  Mobility Bed Mobility Bed Mobility: Rolling Right;Rolling Left;Sit to Supine;Supine to Sit Rolling Right: Independent Rolling Left: Independent Supine to Sit: Independent Sit to Supine: Independent Transfers Transfers: Sit to Stand;Stand to Sit;Stand Pivot Transfers Sit to Stand: Independent with assistive device Stand to Sit: Independent with assistive device Stand Pivot Transfers: Independent with assistive device Transfer (Assistive device): Rolling walker Locomotion  Gait Ambulation: Yes Gait Assistance: Supervision/Verbal cueing Gait Distance (Feet): 220 Feet Assistive device: Rolling walker Gait Assistance Details: Verbal cues for safe use of DME/AE Gait Assistance Details: verbal cues for energy conservation and occasional cues for RW safety Gait Gait: Yes Gait Pattern: Impaired Gait Pattern: Step-through pattern;Decreased step length - left;Decreased step length - right;Wide base of support;Poor foot clearance - right;Poor foot clearance - left;Decreased trunk rotation Gait velocity: decreased Stairs / Additional Locomotion Stairs: Yes Stairs Assistance: Contact Guard/Touching assist Stair Management Technique: Two rails Number of Stairs: 4 Height of Stairs: 6 Ramp: Supervision/Verbal cueing (RW) Wheelchair Mobility Wheelchair Mobility: Yes Wheelchair Assistance: Chartered loss adjuster: Both upper extremities Wheelchair Parts Management: Needs assistance Distance: 79f  Trunk/Postural Assessment  Cervical Assessment Cervical Assessment: Exceptions to [facility](forward head) Thoracic Assessment Thoracic Assessment: Exceptions to [facility](rounded shoulders) Lumbar Assessment Lumbar Assessment: Exceptions to [facility](posterior pelvic tilt) Postural  Control Postural Control: Deficits on evaluation  Balance Balance Balance Assessed: Yes Static Sitting Balance Static Sitting - Balance Support: Feet supported;No upper extremity supported Static Sitting - Level of Assistance: 7: Independent Dynamic Sitting Balance Dynamic Sitting - Balance Support: Feet supported;No upper extremity supported Dynamic Sitting - Level of Assistance: 7: Independent Static Standing Balance Static Standing - Balance Support: Bilateral upper extremity supported (RW) Static Standing - Level of Assistance: 6: Modified independent (Device/Increase time) Dynamic Standing Balance Dynamic Standing - Balance Support: Bilateral upper extremity supported (RW) Dynamic Standing -  Level of Assistance: 6: Modified independent (Device/Increase time) Dynamic Standing - Comments: with transfers Extremity Assessment  RLE Assessment RLE Assessment: Exceptions to [facility] General Strength Comments: grossly generalized to 4/5 LLE Assessment LLE Assessment: Exceptions to [facility] General Strength Comments: grossly generalized to 4/5 (except hip flexion and DF 4-/5)  Alfonse Alpers PT, DPT  01/08/2021, 7:49 AM

## 2021-01-08 NOTE — Progress Notes (Signed)
Physical Therapy Session Note  Patient Details  Name: Kayla Harrison MRN: [id number] Date of Birth: 1946-01-26  Today's Date: 01/08/2021 PT Individual Time: 8338-2505 and 3976-7341 PT Individual Time Calculation (min): 55 min and 42 min  Short Term Goals: Week 1:  PT Short Term Goal 1 (Week 1): Patient will perform bed mobility with supervision consistently. PT Short Term Goal 1 - Progress (Week 1): Met PT Short Term Goal 2 (Week 1): Patient will perform basic transfers with CGA using LRAD. PT Short Term Goal 2 - Progress (Week 1): Met PT Short Term Goal 3 (Week 1): Patient will perform standing tolerace without symptoms >4 min. PT Short Term Goal 3 - Progress (Week 1): Met PT Short Term Goal 4 (Week 1): Patient will ambulate >20 ft using LRAD with min A. PT Short Term Goal 4 - Progress (Week 1): Met Week 2:  PT Short Term Goal 1 (Week 2): STG=LTG due to LOS  Skilled Therapeutic Interventions/Progress Updates:   Treatment Session 1: 9379-0240 55 min Received pt sitting in WC at sink with MD present for morning rounds. Pt agreeable to therapy and denied any pain during session. Session with focus on discharge planning, functional mobility/transfers, generalized strengthening, dynamic standing balance/coordination, ambulation, simulated car transfers, stair navigation, toileting, and improved activity tolerance. Pt stood at sink and brushed teeth mod I and donned socks and shoes mod I. Pt transported to 52M ortho gym in [facility] total A for time management purposes and performed simulated car transfer with RW and supervision. Pt ambulated 3ft on uneven surfaces (ramp) with RW and supervision and picked up cone with RW and CGA. Pt then navigated 4 steps with 2 rails and CGA/close supervision ascending and descending with a step to pattern. Pt then reported sudden urge to have BM and performed all toileting with mod I/supervision (cues for hand placement on grab bar when standing from low toilet). Pt  with small amount of loose stool. Pt stood at sink and washed hands mod I. Pt transported back to room in [facility] total A and ambulated 37ft with RW and supervision to recliner. Concluded session with pt sitting in recliner with all needs within reach. Pt made mod I in room. Per RN, pt now on enteric precautions due to E.Coli in urine.   Treatment Session 2: 1445-1527 42 min Received pt sitting in recliner, pt agreeable to therapy, and denied any pain during session. Session with focus on functional mobility/transfers, generalized strengthening, dynamic standing balance/coordination, gait training, curb navigation, and improved activity tolerance. Pt ambulated 20ft x 1, 236ft x 2, and 47ft x 1 with RW and supervision throughout session. Pt transported to 4W therapy gym in [facility] total A for time management purposes and directed in curb navigation using RW. Pt navigated 1 6in curb x 2 trials with RW and CGA with cues for technique. Pt with mild difficulty lifting bodyweight up onto curb with noted Trendelenburg hip drop on L. Doffed jacket and shoes sitting EOB with mod I and transferred sit<>supine independently. Concluded session with pt supine in bed with all needs within reach.   Therapy Documentation Precautions:  Precautions Precautions: Fall Precaution Comments: orthostatic hypotension, B LE ACE wraps, Abdominal binder as approriate Restrictions Weight Bearing Restrictions: No LUE Weight Bearing: Weight bearing as tolerated  Therapy/Group: Individual Therapy Alfonse Alpers PT, DPT   01/08/2021, 7:17 AM

## 2021-01-08 NOTE — Progress Notes (Signed)
Occupational Therapy Session Note  Patient Details  Name: Kayla Harrison MRN: [id number] Date of Birth: 20-Jan-1946  Today's Date: 01/08/2021 OT Individual Time: 1115-1202 OT Individual Time Calculation (min): 47 min    Short Term Goals: Week 2:  OT Short Term Goal 1 (Week 2): STTG = LTGs due to remaining LOS  Skilled Therapeutic Interventions/Progress Updates:    Treatment session with focus on functional mobility, bathroom transfers, and activity tolerance in preparation for d/c.  Pt received upright in recliner upon arrival reporting already bathed and dressed this AM without any assistance.  Pt asking questions about safety with showering.  Discussed temperature moderation, sitting during shower, and modifying setup to increase reach towards feet with drying to decrease extreme bending.  Pt reports lightheadedness when bending over - educated on propping lower leg on step stool vs RW rung to decrease need to bend for LB drying and dressing.  Pt completed walk-in shower transfer in room bathroom to include stepping over 4" ledge similar to home.  Pt reports grab bars at home more conducive to safe transfer when stepping over shower ledge.  Pt completed at supervision level.  Pt reports plan to have someone present when she bathes at shower level.  Pt requested to ambulate for activity tolerance/strengthening.  Pt ambulated ~250' with RW before requiring seated rest break.  Pt then ambulated total of 820' with rest breaks and supervision with w/c follow to allow pt to sit when needed.  Pt averaging 200' at a time before needing seated rest break.  Discussed energy conservation and safety with community reintegration.  Pt returned to room and transferred to [facility] with RW Mod I.  Pt remained on [facility] upon exit as pt Mod I in room in preparation for d/c home tomorrow.  Therapy Documentation Precautions:  Precautions Precautions: Fall Precaution Comments: monitor BP; abdominal binder as  appropriate Restrictions Weight Bearing Restrictions: No LUE Weight Bearing: Weight bearing as tolerated Pain: Pain Assessment Pain Scale: 0-10 Pain Score: 0-No pain   Therapy/Group: Individual Therapy  Simonne Come 01/08/2021, 12:23 PM

## 2021-01-08 NOTE — Progress Notes (Signed)
   Patient symptomatically has continued to improve.    Plan for home tomorrow.   BP now running 140-150s on florinef.   Will gradually wean and decrease to 0.05 mg BID for now and plan to discharge on this dose.    Would continue to use lasix prn at home.   Will schedule follow up with Dr. Caryl Comes 4-6 weeks.   Legrand Como [address] [place], PA-C  01/08/2021 2:22 PM

## 2021-01-08 NOTE — Progress Notes (Signed)
Occupational Therapy Discharge Summary  Patient Details  Name: Kayla Harrison MRN: [id number] Date of Birth: September 22, 1946   Patient has met 7 of 7 long term goals due to improved activity tolerance, improved balance, ability to compensate for deficits and improved awareness.  Patient to discharge at overall Modified Independent level.  Patient to discharge home alone with intermittent assistance from family and friends, therefore no formal education completed with family as pt able to provide her own care and direct care as needed.  Therapy is recommending pt have supervision when bathing at shower level due to h/o syncopal episodes in shower.    Reasons goals not met: N/A  Recommendation:  Patient will benefit from ongoing skilled OT services in home health setting to continue to advance functional skills in the area of BADL and Reduce care partner burden.  Equipment: No equipment provided  Reasons for discharge: treatment goals met and discharge from hospital  Patient/family agrees with progress made and goals achieved: Yes  OT Discharge Precautions/Restrictions  Precautions Precautions: Fall Precaution Comments: monitor BP; abdominal binder as appropriate Pain Pain Assessment Pain Scale: 0-10 Pain Score: 0-No pain ADL ADL Eating: Independent Grooming: Modified independent Where Assessed-Grooming: Sitting at sink,Standing at sink Upper Body Bathing: Modified independent Where Assessed-Upper Body Bathing: Sitting at sink Lower Body Bathing: Modified independent Where Assessed-Lower Body Bathing: Sitting at sink,Standing at sink Upper Body Dressing: Modified independent (Device) Where Assessed-Upper Body Dressing: Sitting at sink Lower Body Dressing: Modified independent Where Assessed-Lower Body Dressing: Sitting at sink,Standing at sink Toileting: Modified independent Where Assessed-Toileting: Bedside Commode Toilet Transfer: Modified independent Toilet Transfer  Method: Counselling psychologist: Extra wide drop arm bedside commode Tub/Shower Transfer: Not assessed Social research officer, government: Close supervision Social research officer, government Method: Heritage manager: Civil engineer, contracting with back,Grab bars Vision Baseline Vision/History: Wears glasses Wears Glasses: Reading only Patient Visual Report: Blurring of vision Vision Assessment?: No apparent visual deficits Perception  Perception: Within Functional Limits Praxis Praxis: Intact Cognition Overall Cognitive Status: Within Functional Limits for tasks assessed Arousal/Alertness: Awake/alert Orientation Level: Oriented X4 Attention: Sustained;Selective Sustained Attention: Appears intact Selective Attention: Appears intact Memory: Appears intact Awareness: Appears intact Problem Solving: Appears intact Safety/Judgment: Appears intact Sensation Sensation Light Touch: Impaired by gross assessment Proprioception: Appears Intact Additional Comments: pt reports neuropathy in BLEs Coordination Gross Motor Movements are Fluid and Coordinated: No Fine Motor Movements are Fluid and Coordinated: Yes Coordination and Movement Description: generalized deconditioning and weakness Finger Nose Finger Test: [facility] bilaterally Heel Shin Test: [facility] bilaterally Motor  Motor Motor: Other (comment) Motor - Skilled Clinical Observations: generalized weakness and deconditioning Mobility  Bed Mobility Bed Mobility: Rolling Right;Rolling Left;Sit to Supine;Supine to Sit Rolling Right: Independent Rolling Left: Independent Supine to Sit: Independent Sit to Supine: Independent Transfers Sit to Stand: Independent with assistive device Stand to Sit: Independent with assistive device  Trunk/Postural Assessment  Cervical Assessment Cervical Assessment: Exceptions to [facility] (forward head) Thoracic Assessment Thoracic Assessment: Exceptions to [facility] (rounded shoulders) Lumbar Assessment Lumbar  Assessment: Exceptions to [facility] (posterior pelvic tilt) Postural Control Postural Control: Deficits on evaluation  Balance Balance Balance Assessed: Yes Static Sitting Balance Static Sitting - Balance Support: Feet supported;No upper extremity supported Static Sitting - Level of Assistance: 7: Independent Dynamic Sitting Balance Dynamic Sitting - Balance Support: Feet supported;No upper extremity supported Dynamic Sitting - Level of Assistance: 7: Independent Static Standing Balance Static Standing - Balance Support: Bilateral upper extremity supported (RW) Static Standing - Level of Assistance: 6: Modified independent (  Device/Increase time) Dynamic Standing Balance Dynamic Standing - Balance Support: Bilateral upper extremity supported (RW) Dynamic Standing - Level of Assistance: 6: Modified independent (Device/Increase time) Extremity/Trunk Assessment RUE Assessment RUE Assessment: Within Functional Limits General Strength Comments: not formally tested, but [facility] for all self-care tasks and mobility LUE Assessment LUE Assessment: Within Functional Limits General Strength Comments: not formally tested, but [facility] for all self-care tasks and mobility   [name], [facility] 01/08/2021, 1:25 PM

## 2021-01-08 NOTE — Progress Notes (Signed)
PROGRESS NOTE   Subjective/Complaints: Slept much better last night without Lasix Has increased ankle swelling today and SBP upt to 150s. Discussed my be good to stop Florinef- she prefers for cardiology to manage this UC + for ESBL- 1 dose fosfomycin given  ROS: Pt denies CP, SOB, N/V/D, dizziness improved, urinary frequency and dysuria improved  Objective:   No results found. Recent Labs    01/07/21 0502  WBC 7.9  HGB 8.9*  HCT 28.3*  PLT 240   No results for input(s): NA, K, CL, CO2, GLUCOSE, BUN, CREATININE, CALCIUM in the last 72 hours.  Intake/Output Summary (Last 24 hours) at 01/08/2021 1242 Last data filed at 01/08/2021 0746 Gross per 24 hour  Intake 480 ml  Output --  Net 480 ml        Physical Exam: Vital Signs Blood pressure (!) 152/74, pulse 73, temperature 97.7 F (36.5 C), resp. rate 18, height 5\' 6"  (1.676 m), weight 96.3 kg, SpO2 94 %. Gen: no distress, normal appearing HEENT: oral mucosa pink and moist, NCAT Cardio: Reg rate Chest: normal effort, normal rate of breathing Abd: soft, non-distended Ext: no edema Psych: pleasant, normal affect Skin: intact Musculoskeletal:  General: Swellingand deformitypresent.  Cervical back: Normal range of motion.  Right lower leg: Edemapresent.  Left lower leg: Edemapresent.  Skin: Findings: Bruisingand erythemapresent. Hip incision CDI 4/9 small amt serosang fluid on telfa L hip, ecchymosis is 60% less than on photo below , continues to improve.    MS: RUE 5/5 prox to distal. LUE 4-/5 with limitations at elbow d/t pain. LUE 2/5 prox to 4/5 distally d/t pain. RLE 4 prox to 4+/5 distally. No focal sensory findings. SIt to stand with sup    Assessment/Plan: 1. Functional deficits which require 3+ hours per day of interdisciplinary therapy in a comprehensive inpatient rehab setting.  Physiatrist is providing close team  supervision and 24 hour management of active medical problems listed below.  Physiatrist and rehab team continue to assess barriers to discharge/monitor patient progress toward functional and medical goals  Care Tool:  Bathing    Body parts bathed by patient: Right arm,Left arm,Chest,Abdomen,Front perineal area,Right upper leg,Left upper leg,Right lower leg,Left lower leg,Buttocks,Face   Body parts bathed by helper: Buttocks     Bathing assist Assist Level: Independent with assistive device     Upper Body Dressing/Undressing Upper body dressing   What is the patient wearing?: Pull over shirt    Upper body assist Assist Level: Independent with assistive device    Lower Body Dressing/Undressing Lower body dressing      What is the patient wearing?: Incontinence brief,Pants     Lower body assist Assist for lower body dressing: Independent with assitive device     Toileting Toileting    Toileting assist Assist for toileting: Independent with assistive device     Transfers Chair/bed transfer  Transfers assist  Chair/bed transfer activity did not occur: Safety/medical concerns (orthostatic on eval)  Chair/bed transfer assist level: Independent with assistive device Chair/bed transfer assistive device: Programmer, multimedia   Ambulation assist   Ambulation activity did not occur: Safety/medical concerns (orthostatic on eval)  Assist level: Supervision/Verbal  cueing Assistive device: Walker-rolling Max distance: 222ft   Walk 10 feet activity   Assist  Walk 10 feet activity did not occur: Safety/medical concerns  Assist level: Supervision/Verbal cueing Assistive device: Walker-rolling   Walk 50 feet activity   Assist Walk 50 feet with 2 turns activity did not occur: Safety/medical concerns  Assist level: Supervision/Verbal cueing Assistive device: Walker-rolling    Walk 150 feet activity   Assist Walk 150 feet activity did not occur:  Safety/medical concerns  Assist level: Supervision/Verbal cueing Assistive device: Walker-rolling    Walk 10 feet on uneven surface  activity   Assist Walk 10 feet on uneven surfaces activity did not occur: Safety/medical concerns   Assist level: Supervision/Verbal cueing Assistive device: Aeronautical engineer Will patient use wheelchair at discharge?: No   Wheelchair activity did not occur: Safety/medical concerns (orthostatic on eval)  Wheelchair assist level: Supervision/Verbal cueing Max wheelchair distance: 67'    Wheelchair 50 feet with 2 turns activity    Assist    Wheelchair 50 feet with 2 turns activity did not occur: Safety/medical concerns   Assist Level: Supervision/Verbal cueing   Wheelchair 150 feet activity     Assist  Wheelchair 150 feet activity did not occur: Safety/medical concerns   Assist Level: Dependent - Patient 0%   Blood pressure (!) 152/74, pulse 73, temperature 97.7 F (36.5 C), resp. rate 18, height 5\' 6"  (1.676 m), weight 96.3 kg, SpO2 94 %.  Medical Problem List and Plan: 1.Functional deficitssecondary to left coronoid process fracture and left hip labral and hamstring tendon tears with associated large hematoma which required I&D, vac -patient maynot yetshower- due to orthostatic hypotension  Continue CIR -ELOS/Goals: 14 days, mod I to supervision with PT and OT 2. Antithrombotics: -DVT/anticoagulation:Pharmaceutical:Transitioned to Eliquis.  -antiplatelet therapy: N/A 3. Pain from LLE hematoma, right sided rib pain: d/c oxycodone. Continue tylenol PRN. Add lidocaine patch for rib pain.  4. Mood:LCSW to follow for evaluation and support. -antipsychotic agents:  5. Neuropsych: This patientiscapable of making decisions on herown behalf. 6. Skin/Wound Care:RUE hematoma and LLE hematoma healing well. Messaged SW regarding whether patient can  get home wound care to help with LLE.   7. Fluids/Electrolytes/Nutrition:Monitor I/O. Lytes have been stable, repeat PRN.  8. CAF: Monitor HR tid--continue amiodarone 9. Hypothyroid: On supplement 10. Orthostatic hypotension: improved, decrease florinef to 0.1mg  BID -recommended d/cing since SBP now increased, she prefers for cardiology to manage 11. H/o CVA with residual mild aphasia: On Crestor and Eliquis 12. OIC: Has not had BM since admission --augment bowel regimen  4/2- will give Mg citrate- had at least 2 BMs today- will monitor- also was manually disimimpacted- will also increase bowel meds  4/3- had many BMs- feeling much better- con't bowel meds 13. Acute blood loss anemia:   Hgb up to 9.3- iron supplement started.  14. Pre-renal azotemia: BUN up to 25-->push pofluid intake  4/2- same- will push fluids  4/3- BUN down to 22- con't to push fluids PO 15. Leucocytosis: Normalized 16. Left foot pain: ordered XR to assess for fracture given her fall. Negative- no more complaints 17. Lymphedema: continue garments as much as tolerated; may remove if painful. Stable today. Reviewed BNP with patient and her Echo from last year (great EF). Continue to wean Florinef as tolerated as per cardiology recommendations 18. Brittle nails: Zinc supplement ordered while in hospital. Recommended 4 [place] nuts per day at home.  19. Osteopenia: discussed risks of calcium supplement-  she may refuse. Discussed high calcium foods, pharmacologic options.  20. Hypothyroidism: continue synthroid 68.47mcg weekly. 21. Insomnia: melatonin 3mg  HS started 22. Dysuria: UA/UC ordered. + for UTI. Restarted Keflex 500mg  q12H 7 days. Discussed that UC shows multiple species, recollected, results pending. Started pyridium x2 days for burning  23. Hypoalbuminema: brought nuts for her which she liked, will bring more tomorrow.  24. Preventative care: Received COVID Booster 4/16. Feeling tired from this  today 25. Urinary frequency: UC+ for ESBL, 1 dose fosfomycin ordered. Symptoms improved since stopping lasix  25. Disposition: d/c on 4/19. F/u with me on 01/18/21 to arrive in office at 9:20 for 9:40        LOS: 17 days [place] 01/08/2021, 12:42 PM

## 2021-01-09 ENCOUNTER — Other Ambulatory Visit: Payer: Self-pay

## 2021-01-09 ENCOUNTER — Encounter (HOSPITAL_COMMUNITY): Payer: Self-pay

## 2021-01-09 ENCOUNTER — Emergency Department (HOSPITAL_COMMUNITY)
Admission: EM | Admit: 2021-01-09 | Discharge: 2021-01-10 | Disposition: A | Discharge status: Home / Self Care | Payer: Medicare Other | Attending: Emergency Medicine | Admitting: Emergency Medicine

## 2021-01-09 DIAGNOSIS — Z85118 Personal history of other malignant neoplasm of bronchus and lung: Secondary | ICD-10-CM | POA: Diagnosis not present

## 2021-01-09 DIAGNOSIS — Z79899 Other long term (current) drug therapy: Secondary | ICD-10-CM | POA: Insufficient documentation

## 2021-01-09 DIAGNOSIS — Y848 Other medical procedures as the cause of abnormal reaction of the patient, or of later complication, without mention of misadventure at the time of the procedure: Secondary | ICD-10-CM | POA: Insufficient documentation

## 2021-01-09 DIAGNOSIS — Z85850 Personal history of malignant neoplasm of thyroid: Secondary | ICD-10-CM | POA: Diagnosis not present

## 2021-01-09 DIAGNOSIS — Z8616 Personal history of COVID-19: Secondary | ICD-10-CM | POA: Diagnosis not present

## 2021-01-09 DIAGNOSIS — I110 Hypertensive heart disease with heart failure: Secondary | ICD-10-CM | POA: Insufficient documentation

## 2021-01-09 DIAGNOSIS — Z96651 Presence of right artificial knee joint: Secondary | ICD-10-CM | POA: Insufficient documentation

## 2021-01-09 DIAGNOSIS — E039 Hypothyroidism, unspecified: Secondary | ICD-10-CM | POA: Insufficient documentation

## 2021-01-09 DIAGNOSIS — T8131XA Disruption of external operation (surgical) wound, not elsewhere classified, initial encounter: Secondary | ICD-10-CM | POA: Diagnosis not present

## 2021-01-09 DIAGNOSIS — I5032 Chronic diastolic (congestive) heart failure: Secondary | ICD-10-CM | POA: Insufficient documentation

## 2021-01-09 DIAGNOSIS — T8130XA Disruption of wound, unspecified, initial encounter: Secondary | ICD-10-CM

## 2021-01-09 DIAGNOSIS — Z950 Presence of cardiac pacemaker: Secondary | ICD-10-CM | POA: Diagnosis not present

## 2021-01-09 DIAGNOSIS — I4891 Unspecified atrial fibrillation: Secondary | ICD-10-CM | POA: Insufficient documentation

## 2021-01-09 DIAGNOSIS — Z7901 Long term (current) use of anticoagulants: Secondary | ICD-10-CM | POA: Insufficient documentation

## 2021-01-09 LAB — CBC WITH DIFFERENTIAL/PLATELET
Abs Immature Granulocytes: 0.38 10*3/uL — ABNORMAL HIGH (ref 0.00–0.07)
Basophils Absolute: 0.1 10*3/uL (ref 0.0–0.1)
Basophils Relative: 1 %
Eosinophils Absolute: 0.2 10*3/uL (ref 0.0–0.5)
Eosinophils Relative: 3 %
HCT: 36 % (ref 36.0–46.0)
Hemoglobin: 10.9 g/dL — ABNORMAL LOW (ref 12.0–15.0)
Immature Granulocytes: 5 %
Lymphocytes Relative: 12 %
Lymphs Abs: 1 10*3/uL (ref 0.7–4.0)
MCH: 30.1 pg (ref 26.0–34.0)
MCHC: 30.3 g/dL (ref 30.0–36.0)
MCV: 99.4 fL (ref 80.0–100.0)
Monocytes Absolute: 0.9 10*3/uL (ref 0.1–1.0)
Monocytes Relative: 11 %
Neutro Abs: 5.8 10*3/uL (ref 1.7–7.7)
Neutrophils Relative %: 68 %
Platelets: 357 10*3/uL (ref 150–400)
RBC: 3.62 MIL/uL — ABNORMAL LOW (ref 3.87–5.11)
RDW: 19.9 % — ABNORMAL HIGH (ref 11.5–15.5)
WBC: 8.3 10*3/uL (ref 4.0–10.5)
nRBC: 0 % (ref 0.0–0.2)

## 2021-01-09 MED ORDER — ACETAMINOPHEN 325 MG PO TABS: 650.0000 mg | ORAL_TABLET | ORAL | Status: DC | PRN

## 2021-01-09 MED ORDER — MIDODRINE HCL 5 MG PO TABS: 5.0000 mg | ORAL_TABLET | Freq: Two times a day (BID) | ORAL | 0 refills | Status: DC

## 2021-01-09 MED ORDER — TRAZODONE HCL 50 MG PO TABS: 25.0000 mg | ORAL_TABLET | Freq: Every evening | ORAL | 0 refills | Status: DC | PRN

## 2021-01-09 MED ORDER — FERROUS SULFATE 325 (65 FE) MG PO TABS: 325.0000 mg | ORAL_TABLET | Freq: Every day | ORAL | 3 refills | Status: DC

## 2021-01-09 MED ORDER — FLUDROCORTISONE ACETATE 0.1 MG PO TABS: 0.0500 mg | ORAL_TABLET | Freq: Two times a day (BID) | ORAL | 0 refills | Status: DC

## 2021-01-09 NOTE — Discharge Summary (Signed)
Physician Discharge Summary  Patient ID: Kayla Harrison MRN: [id number] DOB/AGE: [birth year] 75 y.o.  Admit date: 12/22/2020 Discharge date: 01/09/2021  Discharge Diagnoses:  Principal Problem:   Physical debility Active Problems:   Constipation   Orthostatic hypotension   Tear of left hamstring   Labral tear of left hip joint   Acute blood loss anemia   Discharged Condition: Stable   Significant Diagnostic Studies: DG Foot Complete Left  Result Date: 12/26/2020 CLINICAL DATA:  75 year old female with pain over the base of the fifth metatarsal. EXAM: LEFT FOOT - COMPLETE 3+ VIEW COMPARISON:  None. FINDINGS: A faint linear lucency through the base of the fifth metatarsal is likely artifactual or represent vascular groove. A nondisplaced fracture is not excluded. Evaluation for fracture is limited due to advanced osteopenia. No other fracture identified. There is no dislocation. The soft tissues are grossly unremarkable. IMPRESSION: Artifact versus less likely a nondisplaced fracture of the base of the fifth metatarsal. Electronically Signed   By: Anner Crete M.D.   On: 12/26/2020 16:53    VAS [place] UPPER EXTREMITY VENOUS DUPLEX  Result Date: 12/27/2020 UPPER VENOUS STUDY  Indications: hematoma Risk Factors: Cancer multiple types of cancer. Comparison Study: No previous upper ext venous Performing Technologist: Vonzell Schlatter RVT  Examination Guidelines: A complete evaluation includes B-mode imaging, spectral Doppler, color Doppler, and power Doppler as needed of all accessible portions of each vessel. Bilateral testing is considered an integral part of a complete examination. Limited examinations for reoccurring indications may be performed as noted.  Right Findings: +----------+------------+---------+-----------+----------+-------+ RIGHT     CompressiblePhasicitySpontaneousPropertiesSummary +----------+------------+---------+-----------+----------+-------+ IJV           Full        Yes       Yes                      +----------+------------+---------+-----------+----------+-------+ Subclavian               Yes       Yes                      +----------+------------+---------+-----------+----------+-------+ Axillary      Full       Yes       Yes                      +----------+------------+---------+-----------+----------+-------+ Brachial      Full       Yes       Yes                      +----------+------------+---------+-----------+----------+-------+ Radial        Full                                          +----------+------------+---------+-----------+----------+-------+ Ulnar         Full                                          +----------+------------+---------+-----------+----------+-------+ Cephalic      Full                                          +----------+------------+---------+-----------+----------+-------+  Basilic       Full                                          +----------+------------+---------+-----------+----------+-------+  Summary:  Right: No evidence of deep vein thrombosis in the upper extremity. No evidence of superficial vein thrombosis in the upper extremity.  *See table(s) above for measurements and observations.  Diagnosing physician: Harold Barban MD Electronically signed by Harold Barban MD on 12/27/2020 at 9:59:40 PM.    Final    [place] EKG SITE RITE  Result Date: 12/26/2020 If Site Rite image not attached, placement could not be confirmed due to current cardiac rhythm.   Labs:  Basic Metabolic Panel: BMP Latest Ref Rng & Units 01/01/2021 12/27/2020 12/24/2020  Glucose 70 - 99 mg/dL 155(H) 119(H) 111(H)  BUN 8 - 23 mg/dL 18 20 22   Creatinine 0.44 - 1.00 mg/dL 1.06(H) 0.92 0.88  BUN/Creat Ratio 12 - 28 - - -  Sodium 135 - 145 mmol/L 140 138 138  Potassium 3.5 - 5.1 mmol/L 4.0 4.2 5.1  Chloride 98 - 111 mmol/L 109 105 104  CO2 22 - 32 mmol/L 25 29 26   Calcium 8.9 - 10.3 mg/dL 8.4(L) 8.3(L) 8.1(L)     CBC: CBC Latest Ref Rng & Units 01/07/2021 01/01/2021 12/27/2020  WBC 4.0 - 10.5 K/uL 7.9 10.3 9.6  Hemoglobin 12.0 - 15.0 g/dL 8.9(L) 9.2(L) 9.3(L)  Hematocrit 36.0 - 46.0 % 28.3(L) 30.1(L) 30.5(L)  Platelets 150 - 400 K/uL 240 214 335    CBG: No results for input(s): GLUCAP in the last 168 hours.  Brief HPI:   Kayla Harrison is a 75 y.o. female with history of Lung cancer, NHL w/SVC syndrome, lap band surgery, A fib, CVA who was admitted on 12/15/20 after a fall with reports of headache, acute on chronic neck and back pain as well as increasing weakness.  She was found to have nondisplaced left coronoid process fracture with question of lucency over the lateral aspect of radial head and large subcu hematoma left hip with moderate OA.  Ortho evaluated patient and recommended WBAT on left elbow.  She continues to be limited by severe left hip pain with significant edema and concerns of skin necrosis.  MRI left hip showed large hematoma 10 cm in size with partial thickness tear of the anterior superior labrum and low-grade partial tears of bilateral hamstring tendon origin.    Eliquis was held, she was placed on heparin bridge and underwent I&D of lateral left hip hematoma by Dr. Lucia Gaskins.  Postop wound VAC was removed on 03/31 and wound was closed.  Dr. Caryl Comes was consulted for input due to history of recurrent falls as well as profound orthostatic hypotension with near syncope with activity.  Florinef was added and titrated upwards.  Therapy ongoing inpatient showing improvement in activity tolerance with continue be limited by weakness and mild dizziness as well as headaches.  CIR was recommended due to functional decline.   Hospital Course: Kayla Harrison was admitted to rehab 12/22/2020 for inpatient therapies to consist of PT, ST and OT at least three hours five days a week. Past admission physiatrist, therapy team and rehab RN have worked together to provide customized collaborative  inpatient rehab. Her blood pressures were monitored on TID basis and cardiology was consulted to help manage orthostatic symptoms. Abdominal binder and BLE ace wraps were added  to help with symptoms but patient has frequent declined this due to body habitus and discomfort.  At admission she reported significant constipation and lack of bowel movement since surgery and bowel program was titrated with good results.  RUE dopplers done due to extensive ecchymosis and was negative for thrombosis. She continued to have extensive edema left hip which is improving with decrease in edema.  Follow up CBC showed persistent leucocytosis and she reported intermittent issues with chills therefore was started on Keflex due to concerns of UTI and for wound prophylaxis. WBC started trending down on antibiotics and UCS showed evidence of ESBL E coli. Keflex was d/c and she was treated with a dose of Monurol with improvement in symptoms. As her activity tolerance and endurance, BP has started stabilizing with onset of supine HTN later in the day. Florinef was weaned down to 0.5 mg bid at discharge.   She did report some SOB when supine on 04/14 and received dose of lasix with improvement of symptoms.  Pre-renal azotemia is improving with increase in fluid intake. Iron supplement was added to supplement ABLA and recommend repeat CBC in a couple of weeks to monitor for recovery. Her insomnia has been managed with use of trazodone at nights and she was advised to wean this after discharge. Left foot xray done due to reports of pain with incresae in activity and was negative for fracture. She received her Covid booster on 04/16. Pain control has improved, oxycodone has been weaned off and she continues on tylenol prn for pain control.  Lidocaine patches ordered for rib pain but patient has declined this. Left hip sutures were removed on day of discharge.  Her activity tolerance and mobility has improved and she supervision is  recommended with ambulation for safety. She will continue to receive follow up HHPT and [place] [facility] after discharge.     Rehab course: During patient's stay in rehab weekly team conferences were held to monitor patient's progress, set goals and discuss barriers to discharge. At admission, patient required max assist with ADL task and min assist with mobility. She  has had improvement in activity tolerance, balance, postural control as well as ability to compensate for deficits. She is able to complete ADL tasks at modified independent level and supervision is recommended for showers due to history of orthostasis. She is modified independent for transfers and needs supervision with cues to ambulate 220' with RW. Her family did not attend family training and plans on checking in. Patient is able to recognize presyncope symptoms and has friend who will be staying with her a few days to transition to home.   Disposition: Home  Diet: Heart Healthy.   Special Instructions: 1. Wash incision with soap and water. Pat dry and apply dry dressing. Change daily and prn.  2. Recommend repeat CBC/BMET in a couple of weeks to monitor H/H and renal status.    Allergies as of 01/09/2021      Reactions   Dofetilide Other (See Comments)   Prolonged QT interval   Ranolazine    Balance issues   Rivaroxaban Other (See Comments)   Nose Bleed X 6 hrs ; packing in ER Nasal hemorrhage   Rivaroxaban    Nose Bleed X 6 hrs ; packing in ER Nasal hemorrhage   Tape Other (See Comments)   SKIN IS THIN AND TEARS EASILY   Tikosyn [dofetilide] Other (See Comments)   "Long QT wave"   Epinephrine Other (See Comments)  Oral anesthetic Dental form only (liquid). Patient stated she will become "out of it" she can hear you but cannot respond in normal fashion. "not fully with it"   Epinephrine    Ketamine Other (See Comments)   Hallucinations   Adhesive [tape] Rash   Paper tape as well   Ketamine Other  (See Comments)   HALLUCINATIONS      Medication List    STOP taking these medications   bisacodyl 5 MG EC tablet Commonly known as: DULCOLAX   calcium-vitamin D 500-200 MG-UNIT tablet Commonly known as: OSCAL WITH D   docusate sodium 100 MG capsule Commonly known as: COLACE   lidocaine 5 % Commonly known as: LIDODERM   ondansetron 4 MG tablet Commonly known as: ZOFRAN   oxyCODONE-acetaminophen 5-325 MG tablet Commonly known as: Percocet   polyethylene glycol 17 g packet Commonly known as: MIRALAX / GLYCOLAX   senna 8.6 MG tablet Commonly known as: SENOKOT     TAKE these medications   acetaminophen 325 MG tablet Commonly known as: TYLENOL Take 2 tablets (650 mg total) by mouth every 4 (four) hours as needed for mild pain. What changed:   medication strength  how much to take  when to take this  reasons to take this   amiodarone 200 MG tablet Commonly known as: PACERONE Take 1 tablet (200 mg total) by mouth daily.   Eliquis 5 MG Tabs tablet Generic drug: apixaban TAKE 1 TABLET BY MOUTH  TWICE DAILY What changed: how much to take   ferrous sulfate 325 (65 FE) MG tablet Take 1 tablet (325 mg total) by mouth daily with breakfast. Start taking on: January 10, 2021   fludrocortisone 0.1 MG tablet Commonly known as: FLORINEF Take 0.5 tablets (0.05 mg total) by mouth 2 (two) times daily with a meal. What changed:   how much to take  when to take this   furosemide 20 MG tablet Commonly known as: LASIX Take 20 mg by mouth daily as needed for fluid or edema.   levothyroxine 137 MCG tablet Commonly known as: SYNTHROID Take 68.5-137 mcg by mouth See admin instructions. Taking 137 mcg tablet daily except on Saturday taking 1/2 tablet   Lifitegrast 5 % Soln Place 1 drop into both eyes at bedtime.   midodrine 5 MG tablet Commonly known as: PROAMATINE Take 1 tablet (5 mg total) by mouth 2 (two) times daily with a meal.   One-A-Day Womens 50+ Advantage  Tabs Take 1 tablet by mouth daily with breakfast.   rosuvastatin 10 MG tablet Commonly known as: CRESTOR Take 10 mg by mouth 2 (two) times a week. Takes on Monday and Thursday   traZODone 50 MG tablet Commonly known as: DESYREL Take 0.5 tablets (25 mg total) by mouth at bedtime as needed for sleep.       Follow-up Information    Raulkar, Clide Deutscher, MD Follow up.   Specialty: Physical Medicine and Rehabilitation Why: 01/18/21 please arrive in office at 9:20 for 9:40 appointment Contact information: [address] [place] [address] [phone]        Erle Crocker, MD. Call in 2 day(s).   Specialty: Orthopedic Surgery Why: for post op appointment Contact information: [place] [address] [phone]        [name], MD. Call in 1 day(s).   Specialty: Endocrinology Why: for post hospital  Contact information: [address] [place] [address] [phone]  Deboraha Sprang, MD .   Specialty: Cardiology Contact information: [phone] [address] [place] [address] [phone]               Signed: Bary Leriche 01/09/2021, 4:19 PM

## 2021-01-09 NOTE — ED Triage Notes (Signed)
Bleeding from incision site (left hip) pt was just sitting in recliner. Packing still in place. Pt states clothes were soaked in blood.   Pt reports being on eliquis and had 500cc of blood taken out from left hip.

## 2021-01-09 NOTE — Progress Notes (Signed)
Inpatient Rehabilitation Care Coordinator Discharge Note  The overall goal for the admission was met for:   Discharge location: Yes, home  Length of Stay: Yes, 18 days  Discharge activity level: Yes, ambulatory level Supervision/Mod I  Home/community participation: Yes  Services provided included: MD, RD, PT, OT, SLP, RN, CM, TR, Pharmacy, [place]: Medicare  Choices offered to/list presented to:pt  Follow-up services arranged: Home Health: [facility]  Comments (or additional information): PT OT SN NO DME  Patient/Family verbalized understanding of follow-up arrangements: Yes  Individual responsible for coordination of the follow-up plan: pt, [phone]  Confirmed correct DME delivered: Dyanne Iha 01/09/2021    Dyanne Iha

## 2021-01-09 NOTE — Progress Notes (Signed)
PROGRESS NOTE   Subjective/Complaints: Plan for d/c today Appreciate cardiology weaning florinef.  She is feeling great, ready to go home Hgb 8.9 two days ago.   ROS: Pt denies CP, SOB, N/V/D, dizziness improved, urinary frequency and dysuria improved, +warmth incision site (unchanged)  Objective:   No results found. Recent Labs    01/07/21 0502  WBC 7.9  HGB 8.9*  HCT 28.3*  PLT 240   No results for input(s): NA, K, CL, CO2, GLUCOSE, BUN, CREATININE, CALCIUM in the last 72 hours.  Intake/Output Summary (Last 24 hours) at 01/09/2021 1013 Last data filed at 01/09/2021 0715 Gross per 24 hour  Intake 359 ml  Output --  Net 359 ml        Physical Exam: Vital Signs Blood pressure (!) 144/75, pulse 74, temperature 97.8 F (36.6 C), temperature source Oral, resp. rate 16, height 5\' 6"  (1.676 m), weight 96.3 kg, SpO2 94 %. Gen: no distress, normal appearing HEENT: oral mucosa pink and moist, NCAT Cardio: Reg rate Chest: normal effort, normal rate of breathing Abd: soft, non-distended Ext: no edema Psych: pleasant, normal affect Skin: intact Musculoskeletal:        General: Swelling and deformity present.     Cervical back: Normal range of motion.     Right lower leg: Edema present.     Left lower leg: Edema present.  Skin:    Findings: Bruising and erythema present. Hip incision CDI 4/9 small amt serosang fluid on telfa L hip, ecchymosis is 60% less than on photo below , continues to improve. Warmth of incision on hip (unchanged). Sutures removed today    MS: RUE 5/5 prox to distal. LUE 4-/5 with limitations at elbow d/t pain. LUE 2/5 prox to 4/5 distally d/t pain. RLE 4 prox to 4+/5 distally. No focal sensory findings.   SIt to stand with sup     Assessment/Plan: 1. Functional deficits which require 3+ hours per day of interdisciplinary therapy in a comprehensive inpatient rehab setting.  Physiatrist is  providing close team supervision and 24 hour management of active medical problems listed below.  Physiatrist and rehab team continue to assess barriers to discharge/monitor patient progress toward functional and medical goals  Care Tool:  Bathing    Body parts bathed by patient: Right arm,Left arm,Chest,Abdomen,Front perineal area,Right upper leg,Left upper leg,Right lower leg,Left lower leg,Buttocks,Face   Body parts bathed by helper: Buttocks     Bathing assist Assist Level: Independent with assistive device     Upper Body Dressing/Undressing Upper body dressing   What is the patient wearing?: Pull over shirt    Upper body assist Assist Level: Independent with assistive device    Lower Body Dressing/Undressing Lower body dressing      What is the patient wearing?: Incontinence brief,Pants     Lower body assist Assist for lower body dressing: Independent with assitive device     Toileting Toileting    Toileting assist Assist for toileting: Independent with assistive device     Transfers Chair/bed transfer  Transfers assist  Chair/bed transfer activity did not occur: Safety/medical concerns (orthostatic on eval)  Chair/bed transfer assist level: Independent with assistive device Chair/bed transfer  assistive device: Museum/gallery exhibitions officer assist   Ambulation activity did not occur: Safety/medical concerns (orthostatic on eval)  Assist level: Supervision/Verbal cueing Assistive device: Walker-rolling Max distance: 226ft   Walk 10 feet activity   Assist  Walk 10 feet activity did not occur: Safety/medical concerns  Assist level: Supervision/Verbal cueing Assistive device: Walker-rolling   Walk 50 feet activity   Assist Walk 50 feet with 2 turns activity did not occur: Safety/medical concerns  Assist level: Supervision/Verbal cueing Assistive device: Walker-rolling    Walk 150 feet activity   Assist Walk 150 feet  activity did not occur: Safety/medical concerns  Assist level: Supervision/Verbal cueing Assistive device: Walker-rolling    Walk 10 feet on uneven surface  activity   Assist Walk 10 feet on uneven surfaces activity did not occur: Safety/medical concerns   Assist level: Supervision/Verbal cueing Assistive device: Aeronautical engineer Will patient use wheelchair at discharge?: No   Wheelchair activity did not occur: Safety/medical concerns (orthostatic on eval)  Wheelchair assist level: Supervision/Verbal cueing Max wheelchair distance: 65'    Wheelchair 50 feet with 2 turns activity    Assist    Wheelchair 50 feet with 2 turns activity did not occur: Safety/medical concerns   Assist Level: Supervision/Verbal cueing   Wheelchair 150 feet activity     Assist  Wheelchair 150 feet activity did not occur: Safety/medical concerns   Assist Level: Dependent - Patient 0%   Blood pressure (!) 144/75, pulse 74, temperature 97.8 F (36.6 C), temperature source Oral, resp. rate 16, height 5\' 6"  (1.676 m), weight 96.3 kg, SpO2 94 %.  Medical Problem List and Plan: 1.  Functional deficits secondary to left coronoid process fracture and left hip labral and hamstring tendon tears with associated large hematoma which required I&D, vac             -patient may not yet shower- due to orthostatic hypotension  D/c home             -ELOS/Goals: 14 days, mod I to supervision with PT and OT 2.  Antithrombotics: -DVT/anticoagulation:  Pharmaceutical: Transitioned to Eliquis.              -antiplatelet therapy: N/A 3. Pain from LLE hematoma, right sided rib pain: d/c oxycodone. Continue tylenol PRN. Add lidocaine patch for rib pain.  4. Mood: LCSW to follow for evaluation and support.              -antipsychotic agents:  5. Neuropsych: This patient is capable of making decisions on her own behalf. 6. Skin/Wound Care: RUE hematoma and LLE hematoma healing well.  Messaged SW regarding whether patient can get home wound care to help with LLE. Sutures removed today.  7. Fluids/Electrolytes/Nutrition: Monitor I/O. Lytes have been stable, repeat PRN.  8. CAF: Monitor HR tid--continue amiodarone 9. Hypothyroid: On supplement 10. Orthostatic hypotension: improved, decrease florinef to 0.5mg  BID -recommended d/cing since SBP now increased, she prefers for cardiology to manage 11. H/o CVA with residual mild aphasia: On Crestor and Eliquis 12. OIC: Has not had BM since admission             --augment bowel regimen  4/2- will give Mg citrate- had at least 2 BMs today- will monitor- also was manually disimimpacted- will also increase bowel meds  4/3- had many BMs- feeling much better- con't bowel meds 13. Acute blood loss anemia:   Hgb down to 8.9- iron supplement started.  Repeat outpatient 14. Pre-renal azotemia: BUN up to 25-->push po fluid intake  4/2- same- will push fluids  4/3- BUN down to 22- con't to push fluids PO 15. Leucocytosis: Normalized 16. Left foot pain: ordered XR to assess for fracture given her fall. Negative- no more complaints 17. Lymphedema: continue garments as much as tolerated; may remove if painful. Stable today. Reviewed BNP with patient and her Echo from last year (great EF). Continue to wean Florinef as tolerated as per cardiology recommendations 18. Brittle nails: Zinc supplement ordered while in hospital. Recommended 4 [place] nuts per day at home.  19. Osteopenia: discussed risks of calcium supplement- she may refuse. Discussed high calcium foods, pharmacologic options.  20. Hypothyroidism: continue synthroid 68.19mcg weekly. 21. Insomnia: melatonin 3mg  HS started 22. Dysuria: UA/UC ordered. + for UTI. Restarted Keflex 500mg  q12H 7 days. Discussed that UC shows multiple species, recollected, results pending. Started pyridium x2 days for burning  23. Hypoalbuminema: brought nuts for her which she liked, will bring more tomorrow.   24. Preventative care: Received COVID Booster 4/16. Feeling tired from this today 25. Urinary frequency: UC+ for ESBL, 1 dose fosfomycin ordered. Symptoms improved since stopping lasix  25. Disposition: d/c on 4/19. F/u with me on 01/18/21 to arrive in office at 9:20 for 9:40       >30 minutes spent in discharge of patient including review of medications and follow-up appointments, physical examination, and in answering all patient's questions  LOS: 18 days A FACE TO FACE EVALUATION WAS [place] 01/09/2021, 10:13 AM

## 2021-01-10 LAB — BASIC METABOLIC PANEL
Anion gap: 5 (ref 5–15)
BUN: 11 mg/dL (ref 8–23)
CO2: 30 mmol/L (ref 22–32)
Calcium: 8.6 mg/dL — ABNORMAL LOW (ref 8.9–10.3)
Chloride: 104 mmol/L (ref 98–111)
Creatinine, Ser: 0.97 mg/dL (ref 0.44–1.00)
GFR, Estimated: >60 mL/min (ref >60)
Glucose, Bld: 109 mg/dL — ABNORMAL HIGH (ref 70–99)
Potassium: 3.5 mmol/L (ref 3.5–5.1)
Sodium: 139 mmol/L (ref 135–145)

## 2021-01-10 NOTE — ED Provider Notes (Signed)
[facility] EMERGENCY DEPARTMENT Provider Note  CSN: [id number] Arrival date & time: 01/09/21 2137  Chief Complaint(s) Post-op Problem and Wound Check  HPI Kayla Harrison is a 75 y.o. female here for open wounds to left thigh that were bleeding from prior hematoma I&D. Patient is on Eliquis. Bleeding has subsided. No trauma. No associated pain. No purulent discharge. No other complaints.  HPI  Past Medical History Past Medical History:  Diagnosis Date  . Anemia   . Arthritis    osteoarthritis - knees and right shoulder  . Blood transfusion without reported diagnosis   . Breast cancer ([place])    Dr Margot Chimes, total thyroidectomy- 1999- for cancer  . 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 Chronic bilateral pleural effusions   . Colon polyp    Dr Earlean Shawl  . Complication of anesthesia    Ketamine produces LSD reaction, bright colored nightmarish experience   . Dyslipidemia   . Endometriosis   . Fibroids   . H/O pleural effusion    s/p thoracentesis w 3282ml withdrawn  . Hepatitis    [place] ?hepatitis   . History of dysphagia    due to radiation therapy  . History of hiatal hernia    small noted on PET scan  . Hypertension   . Hypothyroidism   . Lung cancer, lower lobe ([place]) 01/2017   radiation RX completed 03/04/17; will start chemo 6/27, pt unaware of lung cancer  . Morbid obesity ([place])    Status post lap band surgery  . Nephrolithiasis   . Non Hodgkin's lymphoma ([place])    on chemotherapy  . Persistent atrial fibrillation ([place])    a. s/p PVI 2008 b. s/p convergent ablation 1224 complicated by bradycardia requiring pacemaker implant  . Personal history of radiation therapy   . Presence of permanent cardiac pacemaker   . Rotator cuff tear    Right  . Stroke [facility])    2003- [place] x2  . SVC syndrome    with lung mass and non hodgkins lymphoma  . Thyroid cancer [facility]) 2000   Patient Active Problem List   Diagnosis Date Noted  .  Physical debility 12/22/2020  . Tear of left hamstring 12/22/2020  . Labral tear of left hip joint 12/22/2020  . Acute blood loss anemia   . Orthostatic hypotension 12/17/2020  . History of CVA (cerebrovascular accident) 12/17/2020  . Fall 12/15/2020  . Hip hematoma, left, initial encounter 12/15/2020  . Nondisplaced fracture of coronoid process of left ulna, initial encounter for closed fracture 12/15/2020  . Multiple rib fractures 12/15/2020  . COVID-19 virus infection 10/12/2020  . Constipation 06/22/2020  . Change in bowel habits 06/22/2020  . Diverticulosis 06/22/2020  . Dark stools 06/22/2020  . Hemorrhoids 06/22/2020  . Pacemaker 01/23/2020  . Junctional rhythm 01/23/2020  . Palpitations 01/23/2020  . ICH (intracerebral hemorrhage) ([place]) 12/02/2019  . A-fib ([place]) 12/02/2019  . Anemia 12/02/2019  . QT prolongation 12/02/2019  . Hypothyroidism 12/02/2019  . Gait abnormality 07/10/2018  . S/P reverse total shoulder arthroplasty, 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  . Persistent atrial fibrillation ([place])   . Bilateral ureteral calculi 11/17/2017  . Atrial fibrillation with RVR ([place]) 09/15/2017  . Atrial flutter ([place]) 07/17/2017  . Port catheter in place 04/02/2017  . Non-Hodgkin lymphoma, unspecified, intrathoracic lymph nodes ([place]) 02/13/2017  . Mediastinal mass 02/06/2017  . 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 Abnormal chest x-ray   . Lung mass 02/03/2017  . 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 OSA (obstructive sleep apnea) 02/03/2015  . History of renal calculi 11/16/2014  . Renal calculi 11/16/2014  . Family history of colon cancer 10/26/2014  . Mechanical complication due to cardiac pacemaker pulse generator 11/06/2013  . Dyspnea 05/12/2013  . Hypothyroidism 04/21/2013  . Pleural effusion 04/19/2013  . Long term (current) use of anticoagulants 12/20/2010  . EPIDERMOID CYST 08/22/2010  . Chronic diastolic heart failure ([place]) 08/06/2010  . SYNCOPE AND COLLAPSE  07/27/2010  . OSTEOARTHRITIS, KNEE, RIGHT 03/15/2010  . Morbid obesity ([place]) 12/07/2009  . NONSPEC ELEVATION OF LEVELS OF TRANSAMINASE/LDH 09/08/2009  . BREAST CANCER, HX OF 07/25/2009  . COLONIC POLYPS, HX OF 07/25/2009  . TUBULOVILLOUS ADENOMA, COLON 04/29/2008  . HYPERGLYCEMIA, FASTING 04/29/2008  . HYPERLIPIDEMIA 06/22/2007  . CARCINOMA, THYROID GLAND, HX OF 06/22/2007   Home Medication(s) Prior to Admission medications   Medication Sig Start Date End Date Taking? Authorizing Provider  acetaminophen (TYLENOL) 325 MG tablet Take 2 tablets (650 mg total) by mouth every 4 (four) hours as needed for mild pain. 01/09/21   Love, Ivan Anchors, PA-C  amiodarone (PACERONE) 200 MG tablet Take 1 tablet (200 mg total) by mouth daily. 03/28/20   Deboraha Sprang, MD  ELIQUIS 5 MG TABS tablet TAKE 1 TABLET BY MOUTH  TWICE DAILY Patient taking differently: Take 5 mg by mouth 2 (two) times daily. 06/13/20   Deboraha Sprang, MD  ferrous sulfate 325 (65 FE) MG tablet Take 1 tablet (325 mg total) by mouth daily with breakfast. 01/10/21   Love, Ivan Anchors, PA-C  fludrocortisone (FLORINEF) 0.1 MG tablet Take 0.5 tablets (0.05 mg total) by mouth 2 (two) times daily with a meal. 01/09/21   Love, Ivan Anchors, PA-C  furosemide (LASIX) 20 MG tablet Take 20 mg by mouth daily as needed for fluid or edema.  07/21/19   [provider]  levothyroxine (SYNTHROID) 137 MCG tablet Take 68.5-137 mcg by mouth See admin instructions. Taking 137 mcg tablet daily except on Saturday taking 1/2 tablet    [provider]  Lifitegrast 5 % SOLN Place 1 drop into both eyes at bedtime.     [provider]  midodrine (PROAMATINE) 5 MG tablet Take 1 tablet (5 mg total) by mouth 2 (two) times daily with a meal. 01/09/21   Love, Ivan Anchors, PA-C  Multiple Vitamins-Minerals (ONE-A-DAY WOMENS 50+ ADVANTAGE) TABS Take 1 tablet by mouth daily with breakfast.    [provider]  rosuvastatin (CRESTOR) 10 MG tablet Take 10 mg  by mouth 2 (two) times a week. Takes on Monday and Thursday 06/30/18   [provider]  traZODone (DESYREL) 50 MG tablet Take 0.5 tablets (25 mg total) by mouth at bedtime as needed for sleep. 01/09/21   Bary Leriche, PA-C  Past Surgical History Past Surgical History:  Procedure Laterality Date  . ABDOMINAL HYSTERECTOMY  1983  . afib ablation     a. 2008 PVI b. 2014 convergent ablation  . APPENDECTOMY    . BONE MARROW BIOPSY  02/21/2017  . BREAST LUMPECTOMY Left 2010  . bso  1998  . CARDIAC CATHETERIZATION     2015- negative  . CARDIOVERSION  10/09/2012   Procedure: CARDIOVERSION;  Surgeon: Minus Breeding, MD;  Location: [facility];  Service: Cardiovascular;  Laterality: N/A;  . CARDIOVERSION  10/09/2012   Procedure: CARDIOVERSION;  Surgeon: Minus Breeding, MD;  Location: [facility] ENDOSCOPY;  Service: Cardiovascular;  Laterality: N/A;  [name] gave the ok to add pt to the add on , but we must check to find out if the can add pt on at [phone] ( [phone]  . CARDIOVERSION N/A 11/20/2012   Procedure: CARDIOVERSION;  Surgeon: Fay Records, MD;  Location: [facility];  Service: Cardiovascular;  Laterality: N/A;  . CARDIOVERSION N/A 07/18/2017   Procedure: CARDIOVERSION;  Surgeon: Thayer Headings, MD;  Location: [facility] ENDOSCOPY;  Service: Cardiovascular;  Laterality: N/A;  . CARDIOVERSION N/A 10/03/2017   Procedure: CARDIOVERSION;  Surgeon: Sanda Klein, MD;  Location: [facility] ENDOSCOPY;  Service: Cardiovascular;  Laterality: N/A;  . CARDIOVERSION N/A 01/07/2018   Procedure: CARDIOVERSION;  Surgeon: Thayer Headings, MD;  Location: [facility] ENDOSCOPY;  Service: Cardiovascular;  Laterality: N/A;  . CARDIOVERSION N/A 12/10/2019   Procedure: CARDIOVERSION;  Surgeon: Buford Dresser, MD;  Location: [facility] ENDOSCOPY;  Service: Cardiovascular;  Laterality: N/A;  . CHOLECYSTECTOMY    .  COLONOSCOPY W/ POLYPECTOMY     Dr Earlean Shawl  . CYSTOSCOPY N/A 02/06/2015   Procedure: CYSTOSCOPY;  Surgeon: Kathie Rhodes, MD;  Location: [facility] ORS;  Service: Urology;  Laterality: N/A;  . CYSTOSCOPY W/ RETROGRADES Left 11/17/2017   Procedure: CYSTOSCOPY WITH RETROGRADE /PYELOGRAM/;  Surgeon: Kathie Rhodes, MD;  Location: [facility] ORS;  Service: Urology;  Laterality: Left;  . CYSTOSCOPY WITH RETROGRADE PYELOGRAM, URETEROSCOPY AND STENT PLACEMENT Right 02/06/2015   Procedure: RETROGRADE PYELOGRAM, RIGHT URETEROSCOPY STENT PLACEMENT;  Surgeon: Kathie Rhodes, MD;  Location: [facility] ORS;  Service: Urology;  Laterality: Right;  . CYSTOSCOPY WITH RETROGRADE PYELOGRAM, URETEROSCOPY AND STENT PLACEMENT Right 03/07/2017   Procedure: CYSTOSCOPY WITH RIGHT RETROGRADE PYELOGRAM,RIGHT  URETEROSCOPYLASER LITHOTRIPSY  AND STENT PLACEMENT AND STONE BASKETRY;  Surgeon: Kathie Rhodes, MD;  Location: [facility];  Service: Urology;  Laterality: Right;  . EYE SURGERY     cataract surgery  . fatty mass removal  1999   pubic area  . HOLMIUM LASER APPLICATION N/A 12/30/1446   Procedure: HOLMIUM LASER APPLICATION;  Surgeon: Kathie Rhodes, MD;  Location: [facility]S;  Service: Urology;  Laterality: N/A;  . HOLMIUM LASER APPLICATION Right 1/85/6314   Procedure: HOLMIUM LASER APPLICATION;  Surgeon: Kathie Rhodes, MD;  Location[facility];  Service: Urology;  Laterality: Right;  . HOLMIUM LASER APPLICATION Left 9/70/2637   Procedure: HOLMIUM LASER APPLICATION;  Surgeon: Kathie Rhodes, MD;  Location: [facility] ORS;  Service: Urology;  Laterality: Left;  . I & D EXTREMITY Left 12/19/2020   Procedure: IRRIGATION AND DEBRIDEMENT OF LEFT HIP HEMATOMA WITH APPLICATION OF WOUND VAC;  Surgeon: Erle Crocker, MD;  Location: [facility];  Service: Orthopedics;  Laterality: Left;  . IR FLUORO GUIDE PORT INSERTION RIGHT  02/24/2017  . IR NEPHROSTOMY PLACEMENT RIGHT  11/17/2017  . IR PATIENT EVAL TECH 0-60 MINS  03/11/2017  . IR REMOVAL TUN  ACCESS W/  PORT W/O FL MOD SED  04/20/2018  . IR US GUIDE VASC ACCESS RIGHT  02/24/2017  . KNEE ARTHROSCOPY     bilateral  . LAPAROSCOPIC GASTRIC BANDING  07/10/2010  . LAPAROSCOPIC GASTRIC BANDING     Laparoscopic adjustable banding APS System with posterior hiatal hernia, 2 suture.  Marland Kitchen LAPAROTOMY     for ruptured ovary and ovarian artery   . NEPHROLITHOTOMY Right 11/17/2017   Procedure: NEPHROLITHOTOMY PERCUTANEOUS;  Surgeon: Kathie Rhodes, MD;  Location: [facility];  Service: Urology;  Laterality: Right;  . PACEMAKER INSERTION  03/10/2013   MDT dual chamber PPM  . POCKET REVISION N/A 12/08/2013   Procedure: POCKET REVISION;  Surgeon: Deboraha Sprang, MD;  Location[facility] C[facility] LAB;  Service: Cardiovascular;  Laterality: N/A;  . PORTA CATH INSERTION    . REVERSE SHOULDER ARTHROPLASTY Right 05/14/2018   Procedure: RIGHT REVERSE SHOULDER ARTHROPLASTY;  Surgeon: Tania Ade, MD;  Location: [facility];  Service: Orthopedics;  Laterality: Right;  . REVERSE SHOULDER REPLACEMENT Right 05/14/2018  . RIGHT HEART CATH N/A 07/21/2019   Procedure: RIGHT HEART CATH;  Surgeon: Larey Dresser, MD;  Location: [facility] CV LAB;  Service: Cardiovascular;  Laterality: N/A;  . TEE WITH CARDIOVERSION  09/22/2017  . TEE WITHOUT CARDIOVERSION N/A 10/03/2017   Procedure: TRANSESOPHAGEAL ECHOCARDIOGRAM (TEE);  Surgeon: Sanda Klein, MD;  Location: [facility] ENDOSCOPY;  Service: Cardiovascular;  Laterality: N/A;  . TEE WITHOUT CARDIOVERSION N/A 08/04/2019   Procedure: TRANSESOPHAGEAL ECHOCARDIOGRAM (TEE);  Surgeon: Larey Dresser, MD;  Location: [facility] ENDOSCOPY;  Service: Cardiovascular;  Laterality: N/A;  . TEE WITHOUT CARDIOVERSION N/A 12/10/2019   Procedure: TRANSESOPHAGEAL ECHOCARDIOGRAM (TEE);  Surgeon: Buford Dresser, MD;  Location: [facility] ENDOSCOPY;  Service: Cardiovascular;  Laterality: N/A;  . THYROIDECTOMY  1998   Dr Margot Chimes  . TONSILLECTOMY    . TOTAL KNEE ARTHROPLASTY  04/13/2012   Procedure: TOTAL KNEE  ARTHROPLASTY;  Surgeon: Rudean Haskell, MD;  Location: [place];  Service: Orthopedics;  Laterality: Right;  Marland Kitchen VIDEO BRONCHOSCOPY WITH ENDOBRONCHIAL ULTRASOUND N/A 02/07/2017   Procedure: VIDEO BRONCHOSCOPY WITH ENDOBRONCHIAL ULTRASOUND;  Surgeon: Marshell Garfinkel, MD;  Location: [facility];  Service: Pulmonary;  Laterality: N/A;   Family History Family History  Problem Relation Age of Onset  . Heart disease Father 92       MI $R'@autopsy'CB$   . Colon cancer Father        COLON  . Heart attack Father   . Other Mother        temporal arteritis   . Diabetes Sister   . Diabetes Brother   . Diabetes Paternal Aunt   . Diabetes Paternal Grandmother   . Esophageal cancer Neg Hx   . Inflammatory bowel disease Neg Hx   . Liver disease Neg Hx   . Pancreatic cancer Neg Hx   . Stomach cancer Neg Hx     Social History Social History   Tobacco Use  . Smoking status: Never Smoker  . Smokeless tobacco: Never Used  Vaping Use  . Vaping Use: Never used  Substance Use Topics  . Alcohol use: No    Comment: none since 1990  . Drug use: Never   Allergies Dofetilide, Ranolazine, Rivaroxaban, Rivaroxaban, Tape, Tikosyn [dofetilide], Epinephrine, Epinephrine, Ketamine, Adhesive [tape], and Ketamine  Review of Systems Review of Systems All other systems are reviewed and are negative for acute change except as noted in the HPI  Physical Exam Vital Signs  I have reviewed the triage vital signs BP Marland Kitchen)  143/73 (BP Location: Left Arm)   Pulse 72   Temp (!) 97.5 F (36.4 C)   Resp 17   Ht $R'5\' 6"'gu$  (1.676 m)   Wt 96.3 kg   SpO2 100%   BMI 34.27 kg/m   Physical Exam Skin:         ED Results and Treatments Labs (all labs ordered are listed, but only abnormal results are displayed) Labs Reviewed  CBC WITH DIFFERENTIAL/PLATELET - Abnormal; Notable for the following components:      Result Value   RBC 3.62 (*)    Hemoglobin 10.9 (*)    RDW 19.9 (*)    Abs Immature Granulocytes 0.38 (*)    All other  components within normal limits  BASIC METABOLIC PANEL - Abnormal; Notable for the following components:   Glucose, Bld 109 (*)    Calcium 8.6 (*)    All other components within normal limits                                                                                                                         EKG  EKG Interpretation  Date/Time:    Ventricular Rate:    PR Interval:    QRS Duration:   QT Interval:    QTC Calculation:   R Axis:     Text Interpretation:        Radiology No results found.  Pertinent labs & imaging results that were available during my care of the patient were reviewed by me and considered in my medical decision making (see chart for details).  Medications Ordered in ED Medications - No data to display                                                                                                                                  Procedures Procedures  (including critical care time)  Medical Decision Making / ED Course I have reviewed the nursing notes for this encounter and the patient's prior records (if available in EHR or on provided paperwork).   Kayla Harrison was evaluated in Emergency Department on 01/10/2021 for the symptoms described in the history of present illness. She was evaluated in the context of the global COVID-19 pandemic, which necessitated consideration that the patient might be at risk for infection with the SARS-CoV-2 virus that causes COVID-19. Institutional protocols and algorithms that pertain to the evaluation of patients at risk  for COVID-19 are in a state of rapid change based on information released by regulatory bodies including the CDC and federal and state organizations. These policies and algorithms were followed during the patient's care in the ED.  Wound dehiscence.  Initial incisions were not sutured. No active bleeding noted on exam Hb improved from DC. Recommended wet to dry dressings. Surgery follow up  for additional wound care if needed,      Final Clinical Impression(s) / ED Diagnoses Final diagnoses:  Wound dehiscence   The patient appears reasonably screened and/or stabilized for discharge and I doubt any other medical condition or other [facility] requiring further screening, evaluation, or treatment in the ED at this time prior to discharge. Safe for discharge with strict return precautions.  Disposition: Discharge  Condition: Good  I have discussed the results, Dx and Tx plan with the patient/family who expressed understanding and agree(s) with the plan. Discharge instructions discussed at length. The patient/family was given strict return precautions who verbalized understanding of the instructions. No further questions at time of discharge.    ED Discharge Orders    None        Follow Up: Erle Crocker, MD [place] [phone]  Call  to schedule an appointment for close follow up for wound check  [name], [place] [address] [phone]  Call  as needed      This chart was dictated using voice recognition software.  Despite best efforts to proofread,  errors can occur which can change the documentation meaning.   Fatima Blank, MD 01/10/21 [phone]

## 2021-01-11 ENCOUNTER — Telehealth: Payer: Self-pay | Admitting: *Deleted

## 2021-01-11 DIAGNOSIS — I4819 Other persistent atrial fibrillation: Secondary | ICD-10-CM | POA: Diagnosis not present

## 2021-01-11 DIAGNOSIS — Z6832 Body mass index (BMI) 32.0-32.9, adult: Secondary | ICD-10-CM | POA: Diagnosis not present

## 2021-01-11 DIAGNOSIS — Z9181 History of falling: Secondary | ICD-10-CM | POA: Diagnosis not present

## 2021-01-11 DIAGNOSIS — Z8616 Personal history of COVID-19: Secondary | ICD-10-CM | POA: Diagnosis not present

## 2021-01-11 DIAGNOSIS — I871 Compression of vein: Secondary | ICD-10-CM | POA: Diagnosis not present

## 2021-01-11 DIAGNOSIS — I498 Other specified cardiac arrhythmias: Secondary | ICD-10-CM | POA: Diagnosis not present

## 2021-01-11 DIAGNOSIS — M170 Bilateral primary osteoarthritis of knee: Secondary | ICD-10-CM | POA: Diagnosis not present

## 2021-01-11 DIAGNOSIS — Z7901 Long term (current) use of anticoagulants: Secondary | ICD-10-CM | POA: Diagnosis not present

## 2021-01-11 DIAGNOSIS — E669 Obesity, unspecified: Secondary | ICD-10-CM | POA: Diagnosis not present

## 2021-01-11 DIAGNOSIS — I890 Lymphedema, not elsewhere classified: Secondary | ICD-10-CM | POA: Diagnosis not present

## 2021-01-11 DIAGNOSIS — I110 Hypertensive heart disease with heart failure: Secondary | ICD-10-CM | POA: Diagnosis not present

## 2021-01-11 DIAGNOSIS — I69320 Aphasia following cerebral infarction: Secondary | ICD-10-CM | POA: Diagnosis not present

## 2021-01-11 DIAGNOSIS — I5032 Chronic diastolic (congestive) heart failure: Secondary | ICD-10-CM | POA: Diagnosis not present

## 2021-01-11 DIAGNOSIS — I4892 Unspecified atrial flutter: Secondary | ICD-10-CM | POA: Diagnosis not present

## 2021-01-11 DIAGNOSIS — S52045D Nondisplaced fracture of coronoid process of left ulna, subsequent encounter for closed fracture with routine healing: Secondary | ICD-10-CM | POA: Diagnosis not present

## 2021-01-11 DIAGNOSIS — W19XXXD Unspecified fall, subsequent encounter: Secondary | ICD-10-CM | POA: Diagnosis not present

## 2021-01-11 DIAGNOSIS — S2241XD Multiple fractures of ribs, right side, subsequent encounter for fracture with routine healing: Secondary | ICD-10-CM | POA: Diagnosis not present

## 2021-01-11 DIAGNOSIS — I951 Orthostatic hypotension: Secondary | ICD-10-CM | POA: Diagnosis not present

## 2021-01-11 DIAGNOSIS — C8592 Non-Hodgkin lymphoma, unspecified, intrathoracic lymph nodes: Secondary | ICD-10-CM | POA: Diagnosis not present

## 2021-01-11 DIAGNOSIS — S71002D Unspecified open wound, left hip, subsequent encounter: Secondary | ICD-10-CM | POA: Diagnosis not present

## 2021-01-11 DIAGNOSIS — Z602 Problems related to living alone: Secondary | ICD-10-CM | POA: Diagnosis not present

## 2021-01-11 NOTE — Telephone Encounter (Signed)
Transitional Care call completed Spoke with patient on phone Appointment confirmed Address confirmed    1. Are you/is patient experiencing any problems since coming home? Yes, ED visit wound dehiscence Are there any questions regarding any aspect of care? No   2. Are there any questions regarding medications administration/dosing? No Are meds being taken as prescribed? Yes Patient should review meds with caller to confirm   3. Have there been any falls?  No  4. Has Home Health been to the house and/or have they contacted you? Yes, they were there during this call If not, have you tried to contact them? Can we help you contact them?   5. Are bowels and bladder emptying properly? Yes Are there any unexpected incontinence issues? No If applicable, is patient following bowel/bladder programs?   6. Any fevers, problems with breathing, unexpected pain?  No  7. Are there any skin problems or new areas of breakdown?  Yes, wound broke open, ED visit. All is good for now  8. Has the patient/family member arranged specialty MD follow up (ie cardiology/neurology/renal/surgical/etc)? Yes Can we help arrange? No  9. Does the patient need any other services or support that we can help arrange? No  10. Are caregivers following through as expected in assisting the patient? Yes  11. Has the patient quit smoking, drinking alcohol, or using drugs as recommended? Patient states she has never done any of these things

## 2021-01-12 DIAGNOSIS — I110 Hypertensive heart disease with heart failure: Secondary | ICD-10-CM | POA: Diagnosis not present

## 2021-01-12 DIAGNOSIS — I5032 Chronic diastolic (congestive) heart failure: Secondary | ICD-10-CM | POA: Diagnosis not present

## 2021-01-12 DIAGNOSIS — S71002D Unspecified open wound, left hip, subsequent encounter: Secondary | ICD-10-CM | POA: Diagnosis not present

## 2021-01-12 DIAGNOSIS — S52045D Nondisplaced fracture of coronoid process of left ulna, subsequent encounter for closed fracture with routine healing: Secondary | ICD-10-CM | POA: Diagnosis not present

## 2021-01-12 DIAGNOSIS — S2241XD Multiple fractures of ribs, right side, subsequent encounter for fracture with routine healing: Secondary | ICD-10-CM | POA: Diagnosis not present

## 2021-01-12 DIAGNOSIS — W19XXXD Unspecified fall, subsequent encounter: Secondary | ICD-10-CM | POA: Diagnosis not present

## 2021-01-15 DIAGNOSIS — S71002D Unspecified open wound, left hip, subsequent encounter: Secondary | ICD-10-CM | POA: Diagnosis not present

## 2021-01-15 DIAGNOSIS — S52045D Nondisplaced fracture of coronoid process of left ulna, subsequent encounter for closed fracture with routine healing: Secondary | ICD-10-CM | POA: Diagnosis not present

## 2021-01-15 DIAGNOSIS — W19XXXD Unspecified fall, subsequent encounter: Secondary | ICD-10-CM | POA: Diagnosis not present

## 2021-01-15 DIAGNOSIS — S2241XD Multiple fractures of ribs, right side, subsequent encounter for fracture with routine healing: Secondary | ICD-10-CM | POA: Diagnosis not present

## 2021-01-15 DIAGNOSIS — I110 Hypertensive heart disease with heart failure: Secondary | ICD-10-CM | POA: Diagnosis not present

## 2021-01-15 DIAGNOSIS — I5032 Chronic diastolic (congestive) heart failure: Secondary | ICD-10-CM | POA: Diagnosis not present

## 2021-01-16 DIAGNOSIS — S52045D Nondisplaced fracture of coronoid process of left ulna, subsequent encounter for closed fracture with routine healing: Secondary | ICD-10-CM | POA: Diagnosis not present

## 2021-01-16 DIAGNOSIS — I110 Hypertensive heart disease with heart failure: Secondary | ICD-10-CM | POA: Diagnosis not present

## 2021-01-16 DIAGNOSIS — S2241XD Multiple fractures of ribs, right side, subsequent encounter for fracture with routine healing: Secondary | ICD-10-CM | POA: Diagnosis not present

## 2021-01-16 DIAGNOSIS — I5032 Chronic diastolic (congestive) heart failure: Secondary | ICD-10-CM | POA: Diagnosis not present

## 2021-01-16 DIAGNOSIS — S71002D Unspecified open wound, left hip, subsequent encounter: Secondary | ICD-10-CM | POA: Diagnosis not present

## 2021-01-16 DIAGNOSIS — W19XXXD Unspecified fall, subsequent encounter: Secondary | ICD-10-CM | POA: Diagnosis not present

## 2021-01-18 ENCOUNTER — Other Ambulatory Visit: Payer: Self-pay

## 2021-01-18 ENCOUNTER — Encounter
Payer: Medicare Other | Attending: Physical Medicine and Rehabilitation | Admitting: Physical Medicine and Rehabilitation

## 2021-01-18 ENCOUNTER — Encounter: Payer: Self-pay | Admitting: Physical Medicine and Rehabilitation

## 2021-01-18 VITALS — BP 123/81 | HR 71 | Temp 97.6°F | Ht 66.0 in | Wt 208.4 lb

## 2021-01-18 DIAGNOSIS — C8522 Mediastinal (thymic) large B-cell lymphoma, intrathoracic lymph nodes: Secondary | ICD-10-CM | POA: Insufficient documentation

## 2021-01-18 DIAGNOSIS — I890 Lymphedema, not elsewhere classified: Secondary | ICD-10-CM | POA: Diagnosis not present

## 2021-01-18 MED ORDER — FUROSEMIDE 20 MG PO TABS: 20.0000 mg | ORAL_TABLET | Freq: Every day | ORAL | 1 refills | Status: DC

## 2021-01-18 NOTE — Progress Notes (Signed)
Subjective:    Patient ID: Kayla Harrison, female    DOB: [birth year], 75 y.o.   MRN: [id number]  HPI  1) Left hip hematoma: -Bled during the night. -Average pain is 0/10 -she did not feel she bled enough for her Hgb to be affected.  -surgeon stitched  -a friend of hers who is a nurse has been helping her  2) Right arm hematoma:  -much better.   3) Bilateral lower extremity lymphedema: -Needs a refill of her Lasix.  -she has not done lymphedema therapy -she has compression garments for   4) Hypotension: -improved  5)nInsomnia: -trazodone leaves her drugged the next day  6) Loose stools: -no more -small Bm this morning.   7) Hypoalbuminemia: -has been trying to eat more nuts  -Impaired mobility:  -she has gotten deconditioned again, has not been receiving home therapy as frequently as she would like   Pain Inventory Average Pain 0 Pain Right Now 0 My pain is intermittent, tingling, aching and surgical incision left hip  In the last 24 hours, has pain interfered with the following? General activity 4 Relation with others 0 Enjoyment of life 4 What TIME of day is your pain at its worst? morning  Sleep (in general) Poor  Has trouble staying asleep  Pain is worse with: some activites and when bathing and dressing--no pain just uncomfortable Pain improves with: rest Relief from Meds: no pain  use a walker  Typically [facility] at the [facility] but that is on hold, [facility] PT currently  I need assistance with the following:  household duties and shopping  tingling  Any changes since last visit?  yes  Saw the surgeon yesterday  Any changes since last visit?  no  Sees PCP next month    Family History  Problem Relation Age of Onset  . Heart disease Father 70       MI _0   . Colon cancer Father        COLON  . Heart attack Father   . Other Mother        temporal arteritis   . Diabetes Sister   . Diabetes Brother   . Diabetes Paternal Aunt   .  Diabetes Paternal Grandmother   . Esophageal cancer Neg Hx   . Inflammatory bowel disease Neg Hx   . Liver disease Neg Hx   . Pancreatic cancer Neg Hx   . Stomach cancer Neg Hx    Social History   Socioeconomic History  . Marital status: Single    Spouse name: Not on file  . Number of children: 0  . Years of education: Not on file  . Highest education level: Master's degree (e.g., MA, MS, MEng, MEd, MSW, MBA)  Occupational History  . 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 Smoking status: Never Smoker  . Smokeless tobacco: Never Used  Vaping Use  . Vaping Use: Never used  Substance and Sexual Activity  . Alcohol use: No    Comment: none since 1990  . Drug use: Never  . Sexual activity: Not Currently    Birth control/protection: Surgical  Other Topics Concern  . Not on file  Social History Narrative   ** Merged History Encounter **       SINGLE  NO CHILDREN NEVER SMOKED EXERCISE 3 X WK RETIRED NO CAFFEINE      Social Determinants of Health   Financial Resource Strain: Not on file  Food Insecurity: Not on file  Transportation Needs: Not on file  Physical Activity: Not on file  Stress: Not on file  Social Connections: Not on file   Past Surgical History:  Procedure Laterality Date  . ABDOMINAL HYSTERECTOMY  1983  . afib ablation     a. 2008 PVI b. 2014 convergent ablation  . APPENDECTOMY    . BONE MARROW BIOPSY  02/21/2017  . BREAST LUMPECTOMY Left 2010  . bso  1998  . CARDIAC CATHETERIZATION     2015- negative  . CARDIOVERSION  10/09/2012   Procedure: CARDIOVERSION;  Surgeon: Minus Breeding, MD;  Location: [place];  Service: Cardiovascular;  Laterality: N/A;  . CARDIOVERSION  10/09/2012   Procedure: CARDIOVERSION;  Surgeon: Minus Breeding, MD;  Location: [facility] ENDOSCOPY;  Service: Cardiovascular;  Laterality: N/A;  [name] gave the ok to add pt to the add on , but we must check to find out if the can add pt on  at [phone] ( [phone])  . CARDIOVERSION N/A 11/20/2012   Procedure: CARDIOVERSION;  Surgeon: Fay Records, MD;  Location: [place];  Service: Cardiovascular;  Laterality: N/A;  . CARDIOVERSION N/A 07/18/2017   Procedure: CARDIOVERSION;  Surgeon: Thayer Headings, MD;  Location: [facility] ENDOSCOPY;  Service: Cardiovascular;  Laterality: N/A;  . CARDIOVERSION N/A 10/03/2017   Procedure: CARDIOVERSION;  Surgeon: Sanda Klein, MD;  Location: [facility] ENDOSCOPY;  Service: Cardiovascular;  Laterality: N/A;  . CARDIOVERSION N/A 01/07/2018   Procedure: CARDIOVERSION;  Surgeon: Thayer Headings, MD;  Location: [facility] ENDOSCOPY;  Service: Cardiovascular;  Laterality: N/A;  . CARDIOVERSION N/A 12/10/2019   Procedure: CARDIOVERSION;  Surgeon: Buford Dresser, MD;  Location: [facility] ENDOSCOPY;  Service: Cardiovascular;  Laterality: N/A;  . CHOLECYSTECTOMY    . COLONOSCOPY W/ POLYPECTOMY     Dr Earlean Shawl  . CYSTOSCOPY N/A 02/06/2015   Procedure: CYSTOSCOPY;  Surgeon: Kathie Rhodes, MD;  Location: [facility] ORS;  Service: Urology;  Laterality: N/A;  . CYSTOSCOPY W/ RETROGRADES Left 11/17/2017   Procedure: CYSTOSCOPY WITH RETROGRADE /PYELOGRAM/;  Surgeon: Kathie Rhodes, MD;  Location: [facility];  Service: Urology;  Laterality: Left;  . CYSTOSCOPY WITH RETROGRADE PYELOGRAM, URETEROSCOPY AND STENT PLACEMENT Right 02/06/2015   Procedure: RETROGRADE PYELOGRAM, RIGHT URETEROSCOPY STENT PLACEMENT;  Surgeon: Kathie Rhodes, MD;  Location: [facility];  Service: Urology;  Laterality: Right;  . CYSTOSCOPY WITH RETROGRADE PYELOGRAM, URETEROSCOPY AND STENT PLACEMENT Right 03/07/2017   Procedure: CYSTOSCOPY WITH RIGHT RETROGRADE PYELOGRAM,RIGHT  URETEROSCOPYLASER LITHOTRIPSY  AND STENT PLACEMENT AND STONE BASKETRY;  Surgeon: Kathie Rhodes, MD;  Location: [place];  Service: Urology;  Laterality: Right;  . EYE SURGERY     cataract surgery  . fatty mass removal  1999   pubic area  . HOLMIUM LASER APPLICATION N/A 2/77/8242   Procedure:  HOLMIUM LASER APPLICATION;  Surgeon: Kathie Rhodes, MD;  Location: [facility];  Service: Urology;  Laterality: N/A;  . HOLMIUM LASER APPLICATION Right 3/53/6144   Procedure: HOLMIUM LASER APPLICATION;  Surgeon: Kathie Rhodes, MD;  Location: [facility];  Service: Urology;  Laterality: Right;  . HOLMIUM LASER APPLICATION Left 12/06/4006   Procedure: HOLMIUM LASER APPLICATION;  Surgeon: Kathie Rhodes, MD;  Location: [facility];  Service: Urology;  Laterality: Left;  . I & D EXTREMITY Left 12/19/2020   Procedure: IRRIGATION AND DEBRIDEMENT OF LEFT HIP HEMATOMA WITH APPLICATION OF WOUND VAC;  Surgeon: Erle Crocker, MD;  Location: [place];  Service: Orthopedics;  Laterality: Left;  . IR FLUORO GUIDE PORT INSERTION RIGHT  02/24/2017  . IR NEPHROSTOMY PLACEMENT RIGHT  11/17/2017  . IR PATIENT EVAL TECH 0-60 MINS  03/11/2017  . IR REMOVAL TUN ACCESS W/ PORT W/O FL MOD SED  04/20/2018  . IR US GUIDE VASC ACCESS RIGHT  02/24/2017  . KNEE ARTHROSCOPY     bilateral  . LAPAROSCOPIC GASTRIC BANDING  07/10/2010  . LAPAROSCOPIC GASTRIC BANDING     Laparoscopic adjustable banding APS System with posterior hiatal hernia, 2 suture.  Marland Kitchen LAPAROTOMY     for ruptured ovary and ovarian artery   . NEPHROLITHOTOMY Right 11/17/2017   Procedure: NEPHROLITHOTOMY PERCUTANEOUS;  Surgeon: Kathie Rhodes, MD;  Location: [facility];  Service: Urology;  Laterality: Right;  . PACEMAKER INSERTION  03/10/2013   MDT dual chamber PPM  . POCKET REVISION N/A 12/08/2013   Procedure: POCKET REVISION;  Surgeon: Deboraha Sprang, MD;  Location: [facility] CATH LAB;  Service: Cardiovascular;  Laterality: N/A;  . PORTA CATH INSERTION    . REVERSE SHOULDER ARTHROPLASTY Right 05/14/2018   Procedure: RIGHT REVERSE SHOULDER ARTHROPLASTY;  Surgeon: Tania Ade, MD;  Location: [place];  Service: Orthopedics;  Laterality: Right;  . REVERSE SHOULDER REPLACEMENT Right 05/14/2018  . RIGHT HEART CATH N/A 07/21/2019   Procedure: RIGHT HEART CATH;  Surgeon:  Larey Dresser, MD;  Location: [name] CV LAB;  Service: Cardiovascular;  Laterality: N/A;  . TEE WITH CARDIOVERSION  09/22/2017  . TEE WITHOUT CARDIOVERSION N/A 10/03/2017   Procedure: TRANSESOPHAGEAL ECHOCARDIOGRAM (TEE);  Surgeon: Sanda Klein, MD;  Location: [facility] ENDOSCOPY;  Service: Cardiovascular;  Laterality: N/A;  . TEE WITHOUT CARDIOVERSION N/A 08/04/2019   Procedure: TRANSESOPHAGEAL ECHOCARDIOGRAM (TEE);  Surgeon: Larey Dresser, MD;  Location: [facility] ENDOSCOPY;  Service: Cardiovascular;  Laterality: N/A;  . TEE WITHOUT CARDIOVERSION N/A 12/10/2019   Procedure: TRANSESOPHAGEAL ECHOCARDIOGRAM (TEE);  Surgeon: Buford Dresser, MD;  Location: [facility] ENDOSCOPY;  Service: Cardiovascular;  Laterality: N/A;  . THYROIDECTOMY  1998   Dr Margot Chimes  . TONSILLECTOMY    . TOTAL KNEE ARTHROPLASTY  04/13/2012   Procedure: TOTAL KNEE ARTHROPLASTY;  Surgeon: Rudean Haskell, MD;  Location: [place];  Service: Orthopedics;  Laterality: Right;  Marland Kitchen VIDEO BRONCHOSCOPY WITH ENDOBRONCHIAL ULTRASOUND N/A 02/07/2017   Procedure: VIDEO BRONCHOSCOPY WITH ENDOBRONCHIAL ULTRASOUND;  Surgeon: Marshell Garfinkel, MD;  Location: [place];  Service: Pulmonary;  Laterality: N/A;   Past Medical History:  Diagnosis Date  . Anemia   . Arthritis    osteoarthritis - knees and right shoulder  . Blood transfusion without reported diagnosis   . Breast cancer ([place])    Dr Margot Chimes, total thyroidectomy- 1999- for cancer  . 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 Chronic bilateral pleural effusions   . Colon polyp    Dr Earlean Shawl  . Complication of anesthesia    Ketamine produces LSD reaction, bright colored nightmarish experience   . Dyslipidemia   . Endometriosis   . Fibroids   . H/O pleural effusion    s/p thoracentesis w 3247m withdrawn  . Hepatitis    [place]hepatitis   . History of dysphagia    due to radiation therapy  . History of hiatal hernia    small noted on PET scan  . Hypertension   .  Hypothyroidism   . [place]   radiation RX completed 03/04/17; will start chemo 6/27, pt unaware of lung cancer  . [place]    Status post lap band surgery  . Nephrolithiasis   .  Non Hodgkin's lymphoma ([place])    on chemotherapy  . Persistent atrial fibrillation ([place])    a. s/p PVI 2008 b. s/p convergent ablation 7494 complicated by bradycardia requiring pacemaker implant  . Personal history of radiation therapy   . Presence of permanent cardiac pacemaker   . Rotator cuff tear    Right  . Stroke [facility])    2003- [place] x2  . SVC syndrome    with lung mass and non hodgkins lymphoma  . Thyroid cancer ([place]) 2000   BP 123/81   Pulse 71   Temp 97.6 F (36.4 C)   Ht _0  (1.676 m)   Wt 208 lb 6.4 oz (94.5 kg)   SpO2 98%   BMI 33.64 kg/m   Opioid Risk Score:   Fall Risk Score:  `1  Depression screen PHQ 2/9  Depression screen [facility] 2/9 01/18/2021 08/24/2019  Decreased Interest 0 0  Down, Depressed, Hopeless 0 0  PHQ - 2 Score 0 0  Altered sleeping 3 1  Tired, decreased energy 1 1  Change in appetite 1 0  Feeling bad or failure about yourself  0 0  Trouble concentrating 1 0  Moving slowly or fidgety/restless 0 0  Suicidal thoughts 0 0  PHQ-9 Score 6 -  Difficult doing work/chores - Not difficult at all  Some recent data might be hidden    Review of Systems  Constitutional: Negative.   HENT: Negative.   Eyes: Negative.   Respiratory: Negative.   Cardiovascular: Negative.   Gastrointestinal: Negative.   Musculoskeletal:       Tingling at incision site left hip  Skin: Positive for wound.       Incision site left hip  Allergic/Immunologic: Negative.   Neurological: Negative.        Tingling but it is more incisional  Hematological: Negative.   Psychiatric/Behavioral: Negative.   All other systems reviewed and are negative.      Objective:   Physical Exam Gen: no distress, normal appearing HEENT: oral mucosa pink and  moist, NCAT Cardio: Reg rate Chest: normal effort, normal rate of breathing Abd: soft, non-distended Ext: 2+ edema right leg, 3+ left leg Psych: pleasant, normal affect Skin: left hip incision with sutures, C/D/I, area of darkening about 1inc below sutures, minimial bleeding on dressing Neuo: Alert and oriented x3    Assessment & Plan:  1) Left hip hematoma: -Bled during the night. -Average pain is 0/10 -she did not feel she bled enough for her Hgb to be affected.  -surgeon stitched  -a friend of hers who is a nurse has been helping her -continue to monitor daily -continue three times per week dressing changes -I applied steri-strips today and gave her some for home -educated regarding the importance of orthopedic follow-up  2) Right arm hematoma:  -much better.  -continue to monitor -repeat Hgb with next blood draw  3) Bilateral lower extremity lymphedema: -Needs a refill of her Lasix, provided. Educated regarding risks of hypotension and AKI -she has not done lymphedema therapy, prescribed at cancer center -educated that etiology could be her lymphoma  4) Hypotension: -improved -continue florinef, important to f/u with cardiology to wean  5)Insomnia: -trazodone leaves her drugged the next day -discussed melatonin, magnesium, magnesium rich and melatonin producing foods -Try to go outside near sunrise -Get exercise during the day.  -Discussed good sleep hygiene: turning off all devices an hour before bedtime.   6) Loose stools: -no more -small Bm this morning.  -discussed  foods that are good for constipation  7) Hypoalbuminemia: -has been trying to eat more nuts, commended on this.   8) Impaired mobility:  -she has gotten deconditioned again, has not been receiving home therapy as frequently as she would like -encouraged daily walking with her friends when available, and safely when she has to on her own, wearing her life alert.   Current impairments: -impaired  mobility with hypotension, active bleeding from incision, lower extremity lymphedema  Patient will require home OT and PT; orders have been placed  The patient's medical and/or psychosocial problems require moderate decision-making during transitions in care from inpatient rehabilitation to home. This transitional care appointment included review of the patient's hospital discharge summary, review of the patient's hospital diagnostic tests and discussion of appropriate follow-up, education of the patient regarding their condition, re-establishment of necessary referrals. I will be reviewing patient's home and/or outpatient therapy notes as they progress through therapy and corresponding with therapists accordingly. I have encouraged compliance with current medication regimen (with adjustment to regimen as needed), follow-up with necessary providers, and the importance of following a healthy diet and exercise routine to maximize recovery, health, and quality of life.

## 2021-01-19 DIAGNOSIS — S71002D Unspecified open wound, left hip, subsequent encounter: Secondary | ICD-10-CM | POA: Diagnosis not present

## 2021-01-19 DIAGNOSIS — I110 Hypertensive heart disease with heart failure: Secondary | ICD-10-CM | POA: Diagnosis not present

## 2021-01-19 DIAGNOSIS — W19XXXD Unspecified fall, subsequent encounter: Secondary | ICD-10-CM | POA: Diagnosis not present

## 2021-01-19 DIAGNOSIS — I5032 Chronic diastolic (congestive) heart failure: Secondary | ICD-10-CM | POA: Diagnosis not present

## 2021-01-19 DIAGNOSIS — S52045D Nondisplaced fracture of coronoid process of left ulna, subsequent encounter for closed fracture with routine healing: Secondary | ICD-10-CM | POA: Diagnosis not present

## 2021-01-19 DIAGNOSIS — S2241XD Multiple fractures of ribs, right side, subsequent encounter for fracture with routine healing: Secondary | ICD-10-CM | POA: Diagnosis not present

## 2021-01-23 DIAGNOSIS — I5032 Chronic diastolic (congestive) heart failure: Secondary | ICD-10-CM | POA: Diagnosis not present

## 2021-01-23 DIAGNOSIS — I110 Hypertensive heart disease with heart failure: Secondary | ICD-10-CM | POA: Diagnosis not present

## 2021-01-23 DIAGNOSIS — S71002D Unspecified open wound, left hip, subsequent encounter: Secondary | ICD-10-CM | POA: Diagnosis not present

## 2021-01-23 DIAGNOSIS — W19XXXD Unspecified fall, subsequent encounter: Secondary | ICD-10-CM | POA: Diagnosis not present

## 2021-01-23 DIAGNOSIS — S52045D Nondisplaced fracture of coronoid process of left ulna, subsequent encounter for closed fracture with routine healing: Secondary | ICD-10-CM | POA: Diagnosis not present

## 2021-01-23 DIAGNOSIS — S2241XD Multiple fractures of ribs, right side, subsequent encounter for fracture with routine healing: Secondary | ICD-10-CM | POA: Diagnosis not present

## 2021-01-25 DIAGNOSIS — S52045D Nondisplaced fracture of coronoid process of left ulna, subsequent encounter for closed fracture with routine healing: Secondary | ICD-10-CM | POA: Diagnosis not present

## 2021-01-25 DIAGNOSIS — S2241XD Multiple fractures of ribs, right side, subsequent encounter for fracture with routine healing: Secondary | ICD-10-CM | POA: Diagnosis not present

## 2021-01-25 DIAGNOSIS — I5032 Chronic diastolic (congestive) heart failure: Secondary | ICD-10-CM | POA: Diagnosis not present

## 2021-01-25 DIAGNOSIS — W19XXXD Unspecified fall, subsequent encounter: Secondary | ICD-10-CM | POA: Diagnosis not present

## 2021-01-25 DIAGNOSIS — S71002D Unspecified open wound, left hip, subsequent encounter: Secondary | ICD-10-CM | POA: Diagnosis not present

## 2021-01-25 DIAGNOSIS — I110 Hypertensive heart disease with heart failure: Secondary | ICD-10-CM | POA: Diagnosis not present

## 2021-01-26 DIAGNOSIS — S71002D Unspecified open wound, left hip, subsequent encounter: Secondary | ICD-10-CM | POA: Diagnosis not present

## 2021-01-26 DIAGNOSIS — R300 Dysuria: Secondary | ICD-10-CM | POA: Diagnosis not present

## 2021-01-26 DIAGNOSIS — S52045D Nondisplaced fracture of coronoid process of left ulna, subsequent encounter for closed fracture with routine healing: Secondary | ICD-10-CM | POA: Diagnosis not present

## 2021-01-26 DIAGNOSIS — I110 Hypertensive heart disease with heart failure: Secondary | ICD-10-CM | POA: Diagnosis not present

## 2021-01-26 DIAGNOSIS — S2241XD Multiple fractures of ribs, right side, subsequent encounter for fracture with routine healing: Secondary | ICD-10-CM | POA: Diagnosis not present

## 2021-01-26 DIAGNOSIS — W19XXXD Unspecified fall, subsequent encounter: Secondary | ICD-10-CM | POA: Diagnosis not present

## 2021-01-26 DIAGNOSIS — I5032 Chronic diastolic (congestive) heart failure: Secondary | ICD-10-CM | POA: Diagnosis not present

## 2021-01-29 DIAGNOSIS — I5032 Chronic diastolic (congestive) heart failure: Secondary | ICD-10-CM | POA: Diagnosis not present

## 2021-01-29 DIAGNOSIS — W19XXXD Unspecified fall, subsequent encounter: Secondary | ICD-10-CM | POA: Diagnosis not present

## 2021-01-29 DIAGNOSIS — S71002D Unspecified open wound, left hip, subsequent encounter: Secondary | ICD-10-CM | POA: Diagnosis not present

## 2021-01-29 DIAGNOSIS — I110 Hypertensive heart disease with heart failure: Secondary | ICD-10-CM | POA: Diagnosis not present

## 2021-01-29 DIAGNOSIS — S52045D Nondisplaced fracture of coronoid process of left ulna, subsequent encounter for closed fracture with routine healing: Secondary | ICD-10-CM | POA: Diagnosis not present

## 2021-01-29 DIAGNOSIS — S2241XD Multiple fractures of ribs, right side, subsequent encounter for fracture with routine healing: Secondary | ICD-10-CM | POA: Diagnosis not present

## 2021-01-31 ENCOUNTER — Encounter (HOSPITAL_BASED_OUTPATIENT_CLINIC_OR_DEPARTMENT_OTHER): Payer: Medicare Other | Attending: Physician Assistant | Admitting: Internal Medicine

## 2021-01-31 ENCOUNTER — Other Ambulatory Visit: Payer: Self-pay

## 2021-01-31 DIAGNOSIS — T8131XA Disruption of external operation (surgical) wound, not elsewhere classified, initial encounter: Secondary | ICD-10-CM | POA: Insufficient documentation

## 2021-01-31 DIAGNOSIS — S7002XS Contusion of left hip, sequela: Secondary | ICD-10-CM | POA: Insufficient documentation

## 2021-01-31 DIAGNOSIS — T8130XA Disruption of wound, unspecified, initial encounter: Secondary | ICD-10-CM | POA: Diagnosis not present

## 2021-01-31 DIAGNOSIS — Y838 Other surgical procedures as the cause of abnormal reaction of the patient, or of later complication, without mention of misadventure at the time of the procedure: Secondary | ICD-10-CM | POA: Insufficient documentation

## 2021-01-31 DIAGNOSIS — Z7901 Long term (current) use of anticoagulants: Secondary | ICD-10-CM | POA: Diagnosis not present

## 2021-01-31 DIAGNOSIS — I4891 Unspecified atrial fibrillation: Secondary | ICD-10-CM | POA: Diagnosis not present

## 2021-01-31 DIAGNOSIS — Z853 Personal history of malignant neoplasm of breast: Secondary | ICD-10-CM | POA: Diagnosis not present

## 2021-01-31 DIAGNOSIS — S7002XA Contusion of left hip, initial encounter: Secondary | ICD-10-CM

## 2021-01-31 DIAGNOSIS — Z950 Presence of cardiac pacemaker: Secondary | ICD-10-CM | POA: Insufficient documentation

## 2021-01-31 DIAGNOSIS — W19XXXS Unspecified fall, sequela: Secondary | ICD-10-CM | POA: Insufficient documentation

## 2021-01-31 NOTE — Progress Notes (Signed)
Kayla, Harrison ([id number]) Visit Report for 01/31/2021 Abuse/Suicide Risk Screen Details Patient Name: Date of Service: Kayla, Harrison 01/31/2021 7:30 A M Medical Record Number: [id number] Patient Account Number: [id number] Date of Birth/Sex: Treating RN: July 25, 1946 (75 y.o. Female) Levan Hurst Primary Care Eilah Common: [name] Other Clinician: Referring Moet Mikulski: Treating Aries Kasa/Extender: Betsey Holiday in Treatment: 0 Abuse/Suicide Risk Screen Items Answer ABUSE RISK SCREEN: Has anyone close to you tried to hurt or harm you recentlyo No Do you feel uncomfortable with anyone in your familyo No Has anyone forced you do things that you didnt want to doo No Electronic Signature(s) Signed: 01/31/2021 5:58:33 PM By: Levan Hurst RN, BSN Entered By: Levan Hurst on 01/31/2021 08:09:24 -------------------------------------------------------------------------------- Activities of Daily Living Details Patient Name: Date of Service: Kayla, PETRAITIS R. 01/31/2021 7:30 A M Medical Record Number: [id number] Patient Account Number: [id number] Date of Birth/Sex: Treating RN: 10/21/1945 (75 y.o. Female) Levan Hurst Primary Care Yuval Nolet: [name] Other Clinician: Referring Wynne Jury: Treating Ranae Casebier/Extender: Betsey Holiday in Treatment: 0 Activities of Daily Living Items Answer Activities of Daily Living (Please select one for each item) Drive Automobile Completely Able T Medications ake Completely Able Use T elephone Completely Able Care for Appearance Completely Able Use T oilet Completely Able Bath / Shower Completely Able Dress Self Completely Able Feed Self Completely Able Walk Completely Able Get In / Out Bed Completely Able Housework Completely Able Prepare Meals Completely [place] for Self Completely Able Electronic Signature(s) Signed: 01/31/2021 5:58:33 PM  By: Levan Hurst RN, BSN Entered By: Levan Hurst on 01/31/2021 08:10:08 -------------------------------------------------------------------------------- Education Screening Details Patient Name: Date of Service: Kayla Bacon R. 01/31/2021 7:30 A M Medical Record Number: [id number] Patient Account Number: [id number] Date of Birth/Sex: Treating RN: 11-Jan-1946 (75 y.o. Female) Levan Hurst Primary Care Taino Maertens: [name] Other Clinician: Referring Katalin Colledge: Treating Rudie Sermons/Extender: Betsey Holiday in Treatment: 0 Primary Learner Assessed: Patient Learning Preferences/Education Level/Primary Language Learning Preference: Explanation, Demonstration, Printed Material Highest Education Level: College or Above Preferred Language: English Cognitive Barrier Language Barrier: No Translator Needed: No Memory Deficit: No Emotional Barrier: No Cultural/Religious Beliefs Affecting Medical Care: No Physical Barrier Impaired Vision: No Impaired Hearing: No Decreased Hand dexterity: No Knowledge/Comprehension Knowledge Level: High Comprehension Level: High Ability to understand written instructions: High Ability to understand verbal instructions: High Motivation Anxiety Level: Calm Cooperation: Cooperative Education Importance: Acknowledges Need Interest in Health Problems: Asks Questions Perception: Coherent Willingness to Engage in Self-Management High Activities: Readiness to Engage in Self-Management High Activities: Electronic Signature(s) Signed: 01/31/2021 5:58:33 PM By: Levan Hurst RN, BSN Entered By: Levan Hurst on 01/31/2021 08:10:47 -------------------------------------------------------------------------------- Fall Risk Assessment Details Patient Name: Date of Service: Kayla Bacon R. 01/31/2021 7:30 A M Medical Record Number: [id number] Patient Account Number: [id number] Date of Birth/Sex: Treating  RN: 09-08-1946 (75 y.o. Female) Levan Hurst Primary Care Norwin Aleman: [name] Other Clinician: Referring Galena Logie: Treating Reah Justo/Extender: Betsey Holiday in Treatment: 0 Fall Risk Assessment Items Have you had 2 or more falls in the last 12 monthso 0 Yes Have you had any fall that resulted in injury in the last 12 monthso 0 Yes FALLS RISK SCREEN History of falling - immediate or within 3 months 25 Yes Secondary diagnosis (Do you have 2 or more medical diagnoseso) 15 Yes Ambulatory aid None/bed rest/wheelchair/nurse 0 No Crutches/cane/walker 15 Yes Furniture 0 No Intravenous therapy Access/Saline/Heparin [employer]  0 No Gait/Transferring Normal/ bed rest/ wheelchair 0 Yes Weak (short steps with or without shuffle, stooped but able to lift head while walking, may seek 0 No support from furniture) Impaired (short steps with shuffle, may have difficulty arising from chair, head down, impaired 0 No balance) Mental Status Oriented to own ability 0 Yes Electronic Signature(s) Signed: 01/31/2021 5:58:33 PM By: Levan Hurst RN, BSN Entered By: Levan Hurst on 01/31/2021 08:11:45 -------------------------------------------------------------------------------- Nutrition Risk Screening Details Patient Name: Date of Service: Kayla, NIELD R. 01/31/2021 7:30 A M Medical Record Number: [id number] Patient Account Number: [id number] Date of Birth/Sex: Treating RN: June 07, 1946 (75 y.o. Female) Levan Hurst Primary Care Reynoldo Mainer: [name] Other Clinician: Referring Anntonette Madewell: Treating Hazel Wrinkle/Extender: Betsey Holiday in Treatment: 0 Height (in): 66 Weight (lbs): 200 Body Mass Index (BMI): 32.3 Nutrition Risk Screening Items Score Screening NUTRITION RISK SCREEN: I have an illness or condition that made me change the kind and/or amount of food I eat 0 No I eat fewer than two meals per day 0 No I eat few fruits and  vegetables, or milk products 0 No I have three or more drinks of beer, liquor or wine almost every day 0 No I have tooth or mouth problems that make it hard for me to eat 0 No I don't always have enough money to buy the food I need 0 No I eat alone most of the time 0 No I take three or more different prescribed or over-the-counter drugs a day 1 Yes Without wanting to, I have lost or gained 10 pounds in the last six months 0 No I am not always physically able to shop, cook and/or feed myself 0 No Nutrition Protocols Good Risk Protocol Moderate Risk Protocol 0 Provide education on nutrition High Risk Proctocol Risk Level: Good Risk Score: 1 Electronic Signature(s) Signed: 01/31/2021 5:58:33 PM By: Levan Hurst RN, BSN Entered By: Levan Hurst on 01/31/2021 08:12:00

## 2021-01-31 NOTE — Progress Notes (Signed)
ARIANNI, GALLEGO ([id number]) Visit Report for 01/31/2021 Allergy List Details Patient Name: Date of Service: LENNAN, MALONE 01/31/2021 7:30 A M Medical Record Number: [id number] Patient Account Number: [id number] Date of Birth/Sex: Treating RN: 11-Oct-1945 (75 y.o. Female) Levan Hurst Primary Care Thelda Gagan: [name] Other Clinician: Referring Madilynn Montante: Treating Nasif Bos/Extender: Betsey Holiday in Treatment: 0 Allergies Active Allergies dofetilide Reaction: prolonged QT interval ranolazine Reaction: balance issues rivaroxaban Reaction: nose bleeds Severity: Moderate epinephrine ketamine Reaction: hallucinations Severity: Severe Allergy Notes Electronic Signature(s) Signed: 01/31/2021 5:58:33 PM By: Levan Hurst RN, BSN Entered By: Levan Hurst on 01/31/2021 07:53:27 -------------------------------------------------------------------------------- Arrival Information Details Patient Name: Date of Service: Levin Bacon R. 01/31/2021 7:30 A M Medical Record Number: [id number] Patient Account Number: [id number] Date of Birth/Sex: Treating RN: 1946/04/29 (75 y.o. Female) Levan Hurst Primary Care Aryana Wonnacott: [name] Other Clinician: Referring Tena Linebaugh: Treating Yaret Hush/Extender: Betsey Holiday in Treatment: 0 Visit Information Patient Arrived: Gilford Rile Arrival Time: 07:48 Accompanied By: alone Transfer Assistance: None Patient Identification Verified: Yes Secondary Verification Process Completed: Yes Patient Requires Transmission-Based Precautions: No Patient Has Alerts: Yes Patient Alerts: Patient on Blood Thinner Electronic Signature(s) Signed: 01/31/2021 5:58:33 PM By: Levan Hurst RN, BSN Entered By: Levan Hurst on 01/31/2021 07:49:43 -------------------------------------------------------------------------------- Clinic Level of Care Assessment Details Patient Name: Date of  Service: AVAREE, GILBERTI R. 01/31/2021 7:30 A M Medical Record Number: [id number] Patient Account Number: [id number] Date of Birth/Sex: Treating RN: [birth year] (75 y.o. Female) Baruch Gouty Primary Care Shantana Christon: [name] Other Clinician: Referring Jada Fass: Treating Zakhi Dupre/Extender: Betsey Holiday in Treatment: 0 Clinic Level of Care Assessment Items TOOL 2 Quantity Score []  - 0 Use when only an EandM is performed on the INITIAL visit ASSESSMENTS - Nursing Assessment / Reassessment X- 1 20 General Physical Exam (combine w/ comprehensive assessment (listed just below) when performed on new pt. evals) X- 1 25 Comprehensive Assessment (HX, ROS, Risk Assessments, Wounds Hx, etc.) ASSESSMENTS - Wound and Skin A ssessment / Reassessment []  - 0 Simple Wound Assessment / Reassessment - one wound X- 2 5 Complex Wound Assessment / Reassessment - multiple wounds []  - 0 Dermatologic / Skin Assessment (not related to wound area) ASSESSMENTS - Ostomy and/or Continence Assessment and Care []  - 0 Incontinence Assessment and Management []  - 0 Ostomy Care Assessment and Management (repouching, etc.) PROCESS - Coordination of Care X - Simple Patient / Family Education for ongoing care 1 15 []  - 0 Complex (extensive) Patient / Family Education for ongoing care X- 1 10 Staff obtains Programmer, systems, Records, T Results / Process Orders est X- 1 10 Staff telephones HHA, Nursing Homes / Clarify orders / etc []  - 0 Routine Transfer to another Facility (non-emergent condition) []  - 0 Routine Hospital Admission (non-emergent condition) X- 1 15 New Admissions / Biomedical engineer / Ordering NPWT Apligraf, etc. , []  - 0 Emergency Hospital Admission (emergent condition) X- 1 10 Simple Discharge Coordination []  - 0 Complex (extensive) Discharge Coordination PROCESS - Special Needs []  - 0 Pediatric / Minor Patient Management []  - 0 Isolation Patient  Management []  - 0 Hearing / Language / Visual special needs []  - 0 Assessment of Community assistance (transportation, D/C planning, etc.) []  - 0 Additional assistance / Altered mentation []  - 0 Support Surface(s) Assessment (bed, cushion, seat, etc.) INTERVENTIONS - Wound Cleansing / Measurement X- 1 5 Wound Imaging (photographs - any number of wounds) []  - 0 Wound Tracing (instead  of photographs) []  - 0 Simple Wound Measurement - one wound X- 2 5 Complex Wound Measurement - multiple wounds []  - 0 Simple Wound Cleansing - one wound X- 2 5 Complex Wound Cleansing - multiple wounds INTERVENTIONS - Wound Dressings X - Small Wound Dressing one or multiple wounds 1 10 []  - 0 Medium Wound Dressing one or multiple wounds []  - 0 Large Wound Dressing one or multiple wounds []  - 0 Application of Medications - injection INTERVENTIONS - Miscellaneous []  - 0 External ear exam []  - 0 Specimen Collection (cultures, biopsies, blood, body fluids, etc.) []  - 0 Specimen(s) / Culture(s) sent or taken to Lab for analysis []  - 0 Patient Transfer (multiple staff / Civil Service fast streamer / Similar devices) []  - 0 Simple Staple / Suture removal (25 or less) []  - 0 Complex Staple / Suture removal (26 or more) []  - 0 Hypo / Hyperglycemic Management (close monitor of Blood Glucose) []  - 0 Ankle / Brachial Index (ABI) - do not check if billed separately Has the patient been seen at the hospital within the last three years: Yes Total Score: 150 Level Of Care: New/Established - Level 4 Electronic Signature(s) Signed: 01/31/2021 6:13:52 PM By: Baruch Gouty RN, BSN Entered By: Baruch Gouty on 01/31/2021 08:48:24 -------------------------------------------------------------------------------- Multi Wound Chart Details Patient Name: Date of Service: Levin Bacon R. 01/31/2021 7:30 A M Medical Record Number: [id number] Patient Account Number: [id number] Date of Birth/Sex: Treating  RN: 15-Nov-1945 (75 y.o. Female) Baruch Gouty Primary Care Darrin Koman: [name] Other Clinician: Referring Auden Wettstein: Treating Jaclyn Carew/Extender: Betsey Holiday in Treatment: 0 Vital Signs Height(in): 66 Pulse(bpm): 78 Weight(lbs): 200 Blood Pressure(mmHg): 146/87 Body Mass Index(BMI): 32 Temperature(F): 97.6 Respiratory Rate(breaths/min): 18 Photos: [1:No Photos Left, Proximal Trochanter] [2:No Photos Left, Distal Trochanter] [N/A:N/A N/A] Wound Location: [1:Trauma] [2:Trauma] [N/A:N/A] Wounding Event: [1:Dehisced Wound] [2:Dehisced SJGGE] [N/A:N/A] Primary Etiology: [1:Sleep Apnea, Arrhythmia,] [2:Sleep Apnea, Arrhythmia,] [N/A:N/A] Comorbid History: [1:Hypotension, Osteoarthritis 12/15/2020] [2:Hypotension, Osteoarthritis 12/15/2020] [N/A:N/A] Date Acquired: [1:0] [2:0] [N/A:N/A] Weeks of Treatment: [1:Open] [2:Open] [N/A:N/A] Wound Status: [1:3x1.1x7.5] [2:1.9x0.6x0.1] [N/A:N/A] Measurements L x W x D (cm) [1:2.592] [2:0.895] [N/A:N/A] A (cm) : rea [1:19.439] [2:0.09] [N/A:N/A] Volume (cm) : [1:12] Position 1 (o'clock): [1:4.2] Maximum Distance 1 (cm): [1:6] Position 2 (o'clock): [1:4] Maximum Distance 2 (cm): [1:Yes] [2:No] [N/A:N/A] Tunneling: [1:Full Thickness Without Exposed] [2:Full Thickness Without Exposed] [N/A:N/A] Classification: [1:Support Structures Large] [2:Support Structures Medium] [N/A:N/A] Exudate Amount: [1:Serosanguineous] [2:Serous] [N/A:N/A] Exudate Type: [1:red, brown] [2:amber] [N/A:N/A] Exudate Color: [1:Yes] [2:No] [N/A:N/A] Foul Odor A Cleansing: [1:fter No] [2:N/A] [N/A:N/A] Odor Anticipated Due to Product Use: [1:Well defined, not attached] [2:Flat and Intact] [N/A:N/A] Wound Margin: [1:Large (67-100%)] [2:Small (1-33%)] [N/A:N/A] Granulation A mount: [1:Red] [2:Pink, Pale] [N/A:N/A] Granulation Quality: [1:None Present (0%)] [2:Large (67-100%)] [N/A:N/A] Necrotic Amount: [1:Fat Layer (Subcutaneous Tissue): Yes  Fat Layer (Subcutaneous Tissue): Yes N/A] Exposed Structures: [1:Fascia: No Tendon: No Muscle: No Joint: No Bone: No None] [2:Fascia: No Tendon: No Muscle: No Joint: No Bone: No None] [N/A:N/A] Treatment Notes Electronic Signature(s) Signed: 01/31/2021 4:25:23 PM By: Kalman Shan DO Signed: 01/31/2021 6:13:52 PM By: Baruch Gouty RN, BSN Entered By: Kalman Shan on 01/31/2021 09:37:19 -------------------------------------------------------------------------------- Multi-Disciplinary Care Plan Details Patient Name: Date of Service: Levin Bacon R. 01/31/2021 7:30 A M Medical Record Number: [id number] Patient Account Number: [id number] Date of Birth/Sex: Treating RN: 12/23/1945 (75 y.o. Female) Baruch Gouty Primary Care Adison Reifsteck: [name] Other Clinician: Referring Chet Greenley: Treating Eleftherios Dudenhoeffer/Extender: Betsey Holiday in Treatment: 0  Multidisciplinary Care Plan reviewed with physician Active Inactive Abuse / Safety / Falls / Self Care Management Nursing Diagnoses: History of Falls Potential for falls Goals: Patient/caregiver will verbalize/demonstrate measures taken to prevent injury and/or falls Date Initiated: 01/31/2021 Target Resolution Date: 02/28/2021 Goal Status: Active Interventions: Assess fall risk on admission and as needed Assess impairment of mobility on admission and as needed per policy Assess personal safety and home safety (as indicated) on admission and as needed Notes: Wound/Skin Impairment Nursing Diagnoses: Impaired tissue integrity Knowledge deficit related to ulceration/compromised skin integrity Goals: Patient/caregiver will verbalize understanding of skin care regimen Date Initiated: 01/31/2021 Target Resolution Date: 02/28/2021 Goal Status: Active Ulcer/skin breakdown will have a volume reduction of 30% by week 4 Date Initiated: 01/31/2021 Target Resolution Date: 02/28/2021 Goal Status:  Active Interventions: Assess patient/caregiver ability to obtain necessary supplies Assess patient/caregiver ability to perform ulcer/skin care regimen upon admission and as needed Assess ulceration(s) every visit Provide education on ulcer and skin care Treatment Activities: Skin care regimen initiated : 01/31/2021 Topical wound management initiated : 01/31/2021 Notes: Electronic Signature(s) Signed: 01/31/2021 6:13:52 PM By: Baruch Gouty RN, BSN Entered By: Baruch Gouty on 01/31/2021 08:44:57 -------------------------------------------------------------------------------- Pain Assessment Details Patient Name: Date of Service: Levin Bacon R. 01/31/2021 7:30 A M Medical Record Number: [id number] Patient Account Number: [id number] Date of Birth/Sex: Treating RN: September 02, 1946 (75 y.o. Female) Levan Hurst Primary Care Ziyad Dyar: [name] Other Clinician: Referring Laterria Lasota: Treating Paulanthony Gleaves/Extender: Betsey Holiday in Treatment: 0 Active Problems Location of Pain Severity and Description of Pain Patient Has Paino No Site Locations Pain Management and Medication Current Pain Management: Electronic Signature(s) Signed: 01/31/2021 5:58:33 PM By: Levan Hurst RN, BSN Entered By: Levan Hurst on 01/31/2021 08:15:57 -------------------------------------------------------------------------------- Patient/Caregiver Education Details Patient Name: Date of Service: Durwin Glaze 5/11/2022andnbsp7:30 A M Medical Record Number: [id number] Patient Account Number: [id number] Date of Birth/Gender: Treating RN: 1946/07/30 (75 y.o. Female) Baruch Gouty Primary Care Physician: [name] Other Clinician: Referring Physician: Treating Physician/Extender: Betsey Holiday in Treatment: 0 Education Assessment Education Provided To: Patient Education Topics Provided Welcome T [facility] [place]: o Handouts: Welcome T [facility] [place] o Methods: Explain/Verbal, Printed Responses: Reinforcements needed, State content correctly Wound/Skin Impairment: Handouts: Caring for Your Ulcer, Skin Care Do's and Dont's Methods: Explain/Verbal, Printed Responses: Reinforcements needed, State content correctly Electronic Signature(s) Signed: 01/31/2021 6:13:52 PM By: Baruch Gouty RN, BSN Entered By: Baruch Gouty on 01/31/2021 08:45:36 -------------------------------------------------------------------------------- Wound Assessment Details Patient Name: Date of Service: Levin Bacon R. 01/31/2021 7:30 A M Medical Record Number: [id number] Patient Account Number: [id number] Date of Birth/Sex: Treating RN: Sep 20, 1946 (75 y.o. Female) Levan Hurst Primary Care Jamarques Pinedo: [name] Other Clinician: Referring Merdith Boyd: Treating Massiel Stipp/Extender: Betsey Holiday in Treatment: 0 Wound Status Wound Number: 1 Primary Etiology: Dehisced Wound Wound Location: Left, Proximal Trochanter Wound Status: Open Wounding Event: Trauma Comorbid History: Sleep Apnea, Arrhythmia, Hypotension, Osteoarthritis Date Acquired: 12/15/2020 Weeks Of Treatment: 0 Clustered Wound: No Photos Wound Measurements Length: (cm) 3 Width: (cm) 1.1 Depth: (cm) 7.5 Area: (cm) 2.592 Volume: (cm) 19.439 % Reduction in Area: 0% % Reduction in Volume: 0% Epithelialization: None Tunneling: Yes Location 1 Position (o'clock): 12 Maximum Distance: (cm) 4.2 Location 2 Position (o'clock): 6 Maximum Distance: (cm) 4 Undermining: No Wound Description Classification: Full Thickness Without Exposed Support Structures Wound Margin: Well defined, not attached Exudate Amount: Large Exudate Type: Serosanguineous Exudate Color: red, brown Foul  Odor After Cleansing: Yes Due to Product Use: No Slough/Fibrino No Wound Bed Granulation Amount: Large (67-100%) Exposed  Structure Granulation Quality: Red Fascia Exposed: No Necrotic Amount: None Present (0%) Fat Layer (Subcutaneous Tissue) Exposed: Yes Tendon Exposed: No Muscle Exposed: No Joint Exposed: No Bone Exposed: No Electronic Signature(s) Signed: 01/31/2021 4:50:41 PM By: Sandre Kitty Signed: 01/31/2021 5:58:33 PM By: Levan Hurst RN, BSN Entered By: Sandre Kitty on 01/31/2021 16:18:17 -------------------------------------------------------------------------------- Wound Assessment Details Patient Name: Date of Service: Levin Bacon R. 01/31/2021 7:30 A M Medical Record Number: [id number] Patient Account Number: [id number] Date of Birth/Sex: Treating RN: 11-14-1945 (74 y.o. Female) Levan Hurst Primary Care Yarel Rushlow: [name] Other Clinician: Referring Priyansh Pry: Treating Richard Holz/Extender: Betsey Holiday in Treatment: 0 Wound Status Wound Number: 2 Primary Etiology: Dehisced Wound Wound Location: Left, Distal Trochanter Wound Status: Open Wounding Event: Trauma Comorbid History: Sleep Apnea, Arrhythmia, Hypotension, Osteoarthritis Date Acquired: 12/15/2020 Weeks Of Treatment: 0 Clustered Wound: No Photos Wound Measurements Length: (cm) 1.9 Width: (cm) 0.6 Depth: (cm) 0.1 Area: (cm) 0.895 Volume: (cm) 0.09 % Reduction in Area: 0% % Reduction in Volume: 0% Epithelialization: None Tunneling: No Undermining: No Wound Description Classification: Full Thickness Without Exposed Support Structures Wound Margin: Flat and Intact Exudate Amount: Medium Exudate Type: Serous Exudate Color: amber Foul Odor After Cleansing: No Slough/Fibrino Yes Wound Bed Granulation Amount: Small (1-33%) Exposed Structure Granulation Quality: Pink, Pale Fascia Exposed: No Necrotic Amount: Large (67-100%) Fat Layer (Subcutaneous Tissue) Exposed: Yes Necrotic Quality: Adherent Slough Tendon Exposed: No Muscle Exposed: No Joint Exposed: No Bone  Exposed: No Electronic Signature(s) Signed: 01/31/2021 4:50:41 PM By: Sandre Kitty Signed: 01/31/2021 5:58:33 PM By: Levan Hurst RN, BSN Entered By: Sandre Kitty on 01/31/2021 16:18:33 -------------------------------------------------------------------------------- [place] Details Patient Name: Date of Service: Levin Bacon R. 01/31/2021 7:30 A M Medical Record Number: [id number] Patient Account Number: [id number] Date of Birth/Sex: Treating RN: [birth year] (75 y.o. Female) Levan Hurst Primary Care Yazleemar Strassner: [name] Other Clinician: Referring Jenniffer Vessels: Treating Landy Dunnavant/Extender: Betsey Holiday in Treatment: 0 Vital Signs Time Taken: 07:49 Temperature (F): 97.6 Height (in): 66 Pulse (bpm): 78 Source: Stated Respiratory Rate (breaths/min): 18 Weight (lbs): 200 Blood Pressure (mmHg): 146/87 Source: Stated Reference Range: 80 - 120 mg / dl Body Mass Index (BMI): 32.3 Electronic Signature(s) Signed: 01/31/2021 5:58:33 PM By: Levan Hurst RN, BSN Entered By: Levan Hurst on 01/31/2021 07:50:31

## 2021-01-31 NOTE — Progress Notes (Signed)
Kayla Harrison, Kayla Harrison ([id number]) Visit Report for 01/31/2021 Chief Complaint Document Details Patient Name: Date of Service: Kayla Harrison, Kayla Harrison 01/31/2021 7:30 A M Medical Record Number: [id number] Patient Account Number: [id number] Date of Birth/Sex: Treating RN: 03-10-1946 (75 y.o. Female) Baruch Gouty Primary Care Provider: [name] Other Clinician: Referring Provider: Treating Provider/Extender: Betsey Holiday in Treatment: 0 Information Obtained from: Patient Chief Complaint Left hip hematoma with incision site dehisced from previous evacuation. Electronic Signature(s) Signed: 01/31/2021 4:25:23 PM By: Kalman Shan DO Entered By: Kalman Shan on 01/31/2021 09:37:47 -------------------------------------------------------------------------------- HPI Details Patient Name: Date of Service: Kayla Bacon R. 01/31/2021 7:30 A M Medical Record Number: [id number] Patient Account Number: [id number] Date of Birth/Sex: Treating RN: [birth year] (75 y.o. Female) Baruch Gouty Primary Care Provider: [name] Other Clinician: Referring Provider: Treating Provider/Extender: Betsey Holiday in Treatment: 0 History of Present Illness HPI Description: Ms. Fader is a 75 year old female with a past medical history of A. fib on Eliquis, history of breast cancer, orthostatic hypotension, and pacemaker that presents to the clinic for a left hip wound. On 3/25 the patient fell at home and developed a large hematoma to her left hip and she was evaluated by orthopedic surgery and had the hematoma evacuated. The incision site was sutured and dehisced on 4/19. This was again sutured by orthopedic surgery. She states over the past 3 weeks she has had an increase in size of the surrounding tissue with some hardening to the area. She has some increased warmth and erythema to the surrounding tissue. She denies fever/chills,  nausea/vomiting or purulent drainage. Electronic Signature(s) Signed: 01/31/2021 4:25:23 PM By: Kalman Shan DO Entered By: Kalman Shan on 01/31/2021 09:38:40 -------------------------------------------------------------------------------- Physical Exam Details Patient Name: Date of Service: Kayla Harrison, Kayla R. 01/31/2021 7:30 A M Medical Record Number: [id number] Patient Account Number: [id number] Date of Birth/Sex: Treating RN: [birth year] (75 y.o. Female) Baruch Gouty Primary Care Provider: [name] Other Clinician: Referring Provider: Treating Provider/Extender: Betsey Holiday in Treatment: 0 Constitutional respirations regular, non-labored and within target range for patient.Marland Kitchen Psychiatric pleasant and cooperative. Notes Left hip: Incision site dehisced with sutures loosely in place. Immediate surrounding tissue has some erythema. There is necrotic tissue within the wound bed. There is significant tunneling throughout the wound. Some old blood can be expressed. There is significant tissue swelling surrounding the incision site. No purulent drainage. There is increased warmth to the indurated tissue. Electronic Signature(s) Signed: 01/31/2021 4:25:23 PM By: Kalman Shan DO Entered By: Kalman Shan on 01/31/2021 09:40:22 -------------------------------------------------------------------------------- Physician Orders Details Patient Name: Date of Service: Kayla Bacon R. 01/31/2021 7:30 A M Medical Record Number: [id number] Patient Account Number: [id number] Date of Birth/Sex: Treating RN: 10-Jun-1946 (75 y.o. Female) Baruch Gouty Primary Care Provider: [name] Other Clinician: Referring Provider: Treating Provider/Extender: Betsey Holiday in Treatment: 0 Verbal / Phone Orders: No Diagnosis Coding ICD-10 Coding Code Description T81.30XA Disruption of wound, unspecified, initial  encounter S70.02XA Contusion of left hip, initial encounter I48.91 Unspecified atrial fibrillation Z79.01 Long term (current) use of anticoagulants Z85.3 Personal history of malignant neoplasm of breast Follow-up Appointments ppointment in 1 week. - unless having procedure with orthopedic Return A Other: - follow up with Dr. Lucia Gaskins today [place] No change in wound care orders this week; continue Home Health for wound care. May utilize formulary equivalent dressing for wound treatment orders unless otherwise specified. Other Home Health Orders/Instructions: -  [facility] Wound Treatment Wound #1 - Trochanter Wound Laterality: Left, Proximal Secondary Dressing: Woven Gauze Sponge, Non-Sterile 4x4 in [facility]) Every Other Day/15 Days Discharge Instructions: Apply over primary dressing as directed. Secondary Dressing: ABD Pad, 8x10 [facility]) Every Other Day/15 Days Discharge Instructions: Apply over primary dressing as directed. Secured With: 38M Medipore H Soft Cloth Surgical T 4 x 2 (in/yd) Every Other Day/15 Days ape Discharge Instructions: Secure dressing with tape as directed. Wound #2 - Trochanter Wound Laterality: Left, Distal Secondary Dressing: Woven Gauze Sponge, Non-Sterile 4x4 in [facility]) Every Other Day/15 Days Discharge Instructions: Apply over primary dressing as directed. Secondary Dressing: ABD Pad, 8x10 [facility]) Every Other Day/15 Days Discharge Instructions: Apply over primary dressing as directed. Secured With: 38M Medipore H Soft Cloth Surgical T 4 x 2 (in/yd) Every Other Day/15 Days ape Discharge Instructions: Secure dressing with tape as directed. Patient Medications llergies: ketamine, rivaroxaban, dofetilide, ranolazine, epinephrine A Notifications Medication Indication Start End 01/31/2021 Bactrim DS DOSE 1 - oral 800 mg-160 mg tablet - 1 tablet twice daily for 5 days Electronic Signature(s) Signed: 01/31/2021 4:23:10 PM By: Kalman Shan  DO Entered By: Kalman Shan on 01/31/2021 16:23:10 -------------------------------------------------------------------------------- Problem List Details Patient Name: Date of Service: Kayla Bacon R. 01/31/2021 7:30 A M Medical Record Number: [id number] Patient Account Number: [id number] Date of Birth/Sex: Treating RN: Jun 29, 1946 (75 y.o. Female) Baruch Gouty Primary Care Provider: [name] Other Clinician: Referring Provider: Treating Provider/Extender: Betsey Holiday in Treatment: 0 Active Problems ICD-10 Encounter Code Description Active Date MDM Diagnosis T81.30XA Disruption of wound, unspecified, initial encounter 01/31/2021 No Yes S70.02XA Contusion of left hip, initial encounter 01/31/2021 No Yes I48.91 Unspecified atrial fibrillation 01/31/2021 No Yes Z79.01 Long term (current) use of anticoagulants 01/31/2021 No Yes Z85.3 Personal history of malignant neoplasm of breast 01/31/2021 No Yes Inactive Problems Resolved Problems Electronic Signature(s) Signed: 01/31/2021 4:25:23 PM By: Kalman Shan DO Entered By: Kalman Shan on 01/31/2021 09:37:03 -------------------------------------------------------------------------------- Progress Note Details Patient Name: Date of Service: Kayla Bacon R. 01/31/2021 7:30 A M Medical Record Number: [id number] Patient Account Number: [id number] Date of Birth/Sex: Treating RN: [birth year] (75 y.o. Female) Baruch Gouty Primary Care Provider: [name] Other Clinician: Referring Provider: Treating Provider/Extender: Betsey Holiday in Treatment: 0 Subjective Chief Complaint Information obtained from Patient Left hip hematoma with incision site dehisced from previous evacuation. History of Present Illness (HPI) Ms. Wahler is a 75 year old female with a past medical history of A. fib on Eliquis, history of breast cancer, orthostatic hypotension, and  pacemaker that presents to the clinic for a left hip wound. On 3/25 the patient fell at home and developed a large hematoma to her left hip and she was evaluated by orthopedic surgery and had the hematoma evacuated. The incision site was sutured and dehisced on 4/19. This was again sutured by orthopedic surgery. She states over the past 3 weeks she has had an increase in size of the surrounding tissue with some hardening to the area. She has some increased warmth and erythema to the surrounding tissue. She denies fever/chills, nausea/vomiting or purulent drainage. Patient History Allergies dofetilide (Reaction: prolonged QT interval), ranolazine (Reaction: balance issues), rivaroxaban (Severity: Moderate, Reaction: nose bleeds), epinephrine, ketamine (Severity: Severe, Reaction: hallucinations) Family History Unknown History. Social History Never smoker, Marital Status - Single, Alcohol Use - Never, Drug Use - No History, Caffeine Use - Never. Medical History Respiratory Patient has history of Sleep Apnea Cardiovascular Patient  has history of Arrhythmia - A-Fib, Hypotension - Orthostatic Denies history of Hypertension Musculoskeletal Patient has history of Osteoarthritis Medical A Surgical History Notes nd Endocrine Thyroid Ca, s/p thyroidectomy Oncologic Thyroid cancer Review of Systems (ROS) Constitutional Symptoms (General Health) Denies complaints or symptoms of Fatigue, Fever, Chills, Marked Weight Change. Eyes Denies complaints or symptoms of Dry Eyes, Vision Changes, Glasses / Contacts. Ear/Nose/Mouth/Throat Denies complaints or symptoms of Chronic sinus problems or rhinitis. Respiratory Denies complaints or symptoms of Chronic or frequent coughs, Shortness of Breath. Gastrointestinal Denies complaints or symptoms of Frequent diarrhea, Nausea, Vomiting. Endocrine Denies complaints or symptoms of Heat/cold intolerance. Genitourinary Denies complaints or symptoms of  Frequent urination. Integumentary (Skin) Complains or has symptoms of Wounds - wounds on left hip. Neurologic Denies complaints or symptoms of Numbness/parasthesias. Psychiatric Denies complaints or symptoms of Claustrophobia, Suicidal. Objective Constitutional respirations regular, non-labored and within target range for patient.. Vitals Time Taken: 7:49 AM, Height: 66 in, Source: Stated, Weight: 200 lbs, Source: Stated, BMI: 32.3, Temperature: 97.6 F, Pulse: 78 bpm, Respiratory Rate: 18 breaths/min, Blood Pressure: 146/87 mmHg. Psychiatric pleasant and cooperative. General Notes: Left hip: Incision site dehisced with sutures loosely in place. Immediate surrounding tissue has some erythema. There is necrotic tissue within the wound bed. There is significant tunneling throughout the wound. Some old blood can be expressed. There is significant tissue swelling surrounding the incision site. No purulent drainage. There is increased warmth to the indurated tissue. Integumentary (Hair, Skin) Wound #1 status is Open. Original cause of wound was Trauma. The date acquired was: 12/15/2020. The wound is located on the Left,Proximal Trochanter. The wound measures 3cm length x 1.1cm width x 7.5cm depth; 2.592cm^2 area and 19.439cm^3 volume. There is Fat Layer (Subcutaneous Tissue) exposed. There is no undermining noted, however, there is tunneling at 12:00 with a maximum distance of 4.2cm. There is additional tunneling and at 6:00 with a maximum distance of 4cm. There is a large amount of serosanguineous drainage noted. Foul odor after cleansing was noted. The wound margin is well defined and not attached to the wound base. There is large (67-100%) red granulation within the wound bed. There is no necrotic tissue within the wound bed. Wound #2 status is Open. Original cause of wound was Trauma. The date acquired was: 12/15/2020. The wound is located on the Left,Distal Trochanter. The wound measures 1.9cm  length x 0.6cm width x 0.1cm depth; 0.895cm^2 area and 0.09cm^3 volume. There is Fat Layer (Subcutaneous Tissue) exposed. There is no tunneling or undermining noted. There is a medium amount of serous drainage noted. The wound margin is flat and intact. There is small (1-33%) pink, pale granulation within the wound bed. There is a large (67-100%) amount of necrotic tissue within the wound bed including Adherent Slough. Assessment Active Problems ICD-10 Disruption of wound, unspecified, initial encounter Contusion of left hip, initial encounter Unspecified atrial fibrillation Long term (current) use of anticoagulants Personal history of malignant neoplasm of breast Patient presents with a nonhealing incision site with increased swelling to the surrounding tissue. There appears to still be a very large hematoma. It is unclear if this is infected or not. Patient states that she has an appointment with Dr. Lucia Gaskins today who did the original incision and drainage at the end of March. I would like his opinion on further intervention for what appears to be a very large hematoma. She is on Eliquis and I hesitate to open this up in our clinic. I am happy to do her wound care  once this has been addressed. She would likely benefit from a wound VAC once we are sure there is no infection or hematoma present. I will reach out to Dr. Lucia Gaskins to discuss further. Addendum: Discussed case with Dr. Lucia Gaskins. At this time he would not like to evacuate the hematoma. Since the area looks inflamed and likely infected I will send in an antibiotic to see if this helps. I will reevaluate her in 1 week. Plan Follow-up Appointments: Return Appointment in 1 week. - unless having procedure with orthopedic Other: - follow up with Dr. Lucia Gaskins today Home Health: No change in wound care orders this week; continue [facility] for wound care. May utilize formulary equivalent dressing for wound treatment orders unless otherwise  specified. Other Home Health Orders/Instructions: - [facility] The following medication(s) was prescribed: Bactrim DS oral 800 mg-160 mg tablet 1 1 tablet twice daily for 5 days starting 01/31/2021 WOUND #1: - Trochanter Wound Laterality: Left, Proximal Secondary Dressing: Woven Gauze Sponge, Non-Sterile 4x4 in [facility]) Every Other Day/15 Days Discharge Instructions: Apply over primary dressing as directed. Secondary Dressing: ABD Pad, 8x10 [facility]) Every Other Day/15 Days Discharge Instructions: Apply over primary dressing as directed. Secured With: 24M Medipore H Soft Cloth Surgical T 4 x 2 (in/yd) Every Other Day/15 Days ape Discharge Instructions: Secure dressing with tape as directed. WOUND #2: - Trochanter Wound Laterality: Left, Distal Secondary Dressing: Woven Gauze Sponge, Non-Sterile 4x4 in [facility]) Every Other Day/15 Days Discharge Instructions: Apply over primary dressing as directed. Secondary Dressing: ABD Pad, 8x10 [facility]) Every Other Day/15 Days Discharge Instructions: Apply over primary dressing as directed. Secured With: 24M Medipore H Soft Cloth Surgical T 4 x 2 (in/yd) Every Other Day/15 Days ape Discharge Instructions: Secure dressing with tape as directed. 1. Gauze and ABD pad change daily 2. Follow-up with Dr. Lucia Gaskins 3. Bactrim Electronic Signature(s) Signed: 01/31/2021 4:25:23 PM By: Kalman Shan DO Entered By: Kalman Shan on 01/31/2021 16:24:43 -------------------------------------------------------------------------------- HxROS Details Patient Name: Date of Service: Kayla Bacon R. 01/31/2021 7:30 A M Medical Record Number: [id number] Patient Account Number: [id number] Date of Birth/Sex: Treating RN: April 29, 1946 (75 y.o. Female) Levan Hurst Primary Care Provider: [name] Other Clinician: Referring Provider: Treating Provider/Extender: Betsey Holiday in Treatment: 0 Constitutional Symptoms  (General Health) Complaints and Symptoms: Negative for: Fatigue; Fever; Chills; Marked Weight Change Eyes Complaints and Symptoms: Negative for: Dry Eyes; Vision Changes; Glasses / Contacts Ear/Nose/Mouth/Throat Complaints and Symptoms: Negative for: Chronic sinus problems or rhinitis Respiratory Complaints and Symptoms: Negative for: Chronic or frequent coughs; Shortness of Breath Medical History: Positive for: Sleep Apnea Gastrointestinal Complaints and Symptoms: Negative for: Frequent diarrhea; Nausea; Vomiting Endocrine Complaints and Symptoms: Negative for: Heat/cold intolerance Medical History: Past Medical History Notes: Thyroid Ca, s/p thyroidectomy Genitourinary Complaints and Symptoms: Negative for: Frequent urination Integumentary (Skin) Complaints and Symptoms: Positive for: Wounds - wounds on left hip Neurologic Complaints and Symptoms: Negative for: Numbness/parasthesias Psychiatric Complaints and Symptoms: Negative for: Claustrophobia; Suicidal Hematologic/Lymphatic Cardiovascular Medical History: Positive for: Arrhythmia - A-Fib; Hypotension - Orthostatic Negative for: Hypertension Immunological Musculoskeletal Medical History: Positive for: Osteoarthritis Oncologic Medical History: Past Medical History Notes: Thyroid cancer Immunizations Pneumococcal Vaccine: Received Pneumococcal Vaccination: Yes Implantable Devices None Family and Social History Unknown History: Yes; Never smoker; Marital Status - Single; Alcohol Use: Never; Drug Use: No History; Caffeine Use: Never; Financial Concerns: No; Food, Clothing or Shelter Needs: No; Support System Lacking: No; Transportation Concerns: No Electronic Signature(s) Signed: 01/31/2021  4:25:23 PM By: Kalman Shan DO Signed: 01/31/2021 5:58:33 PM By: Levan Hurst RN, BSN Entered By: Levan Hurst on 01/31/2021  08:09:18 -------------------------------------------------------------------------------- Manuel Garcia Details Patient Name: Date of Service: Kayla Bacon R. 01/31/2021 Medical Record Number: [id number] Patient Account Number: [id number] Date of Birth/Sex: Treating RN: [birth year] (75 y.o. Female) Baruch Gouty Primary Care Provider: [name] Other Clinician: Referring Provider: Treating Provider/Extender: Betsey Holiday in Treatment: 0 Diagnosis Coding ICD-10 Codes Code Description T81.30XA Disruption of wound, unspecified, initial encounter S70.02XA Contusion of left hip, initial encounter I48.91 Unspecified atrial fibrillation Z79.01 Long term (current) use of anticoagulants Z85.3 Personal history of malignant neoplasm of breast Facility Procedures Physician Procedures : CPT4 Code Description Modifier [id number] - WC PHYS LEVEL 4 - NEW PT ICD-10 Diagnosis Description T81.30XA Disruption of wound, unspecified, initial encounter S70.02XA Contusion of left hip, initial encounter I48.91 Unspecified atrial fibrillation  Z79.01 Long term (current) use of anticoagulants Quantity: 1 Electronic Signature(s) Signed: 01/31/2021 4:25:23 PM By: Kalman Shan DO Entered By: Kalman Shan on 01/31/2021 16:25:01

## 2021-02-02 DIAGNOSIS — S2241XD Multiple fractures of ribs, right side, subsequent encounter for fracture with routine healing: Secondary | ICD-10-CM | POA: Diagnosis not present

## 2021-02-02 DIAGNOSIS — W19XXXD Unspecified fall, subsequent encounter: Secondary | ICD-10-CM | POA: Diagnosis not present

## 2021-02-02 DIAGNOSIS — S52045D Nondisplaced fracture of coronoid process of left ulna, subsequent encounter for closed fracture with routine healing: Secondary | ICD-10-CM | POA: Diagnosis not present

## 2021-02-02 DIAGNOSIS — I5032 Chronic diastolic (congestive) heart failure: Secondary | ICD-10-CM | POA: Diagnosis not present

## 2021-02-02 DIAGNOSIS — I110 Hypertensive heart disease with heart failure: Secondary | ICD-10-CM | POA: Diagnosis not present

## 2021-02-02 DIAGNOSIS — M25552 Pain in left hip: Secondary | ICD-10-CM | POA: Diagnosis not present

## 2021-02-02 DIAGNOSIS — S71002D Unspecified open wound, left hip, subsequent encounter: Secondary | ICD-10-CM | POA: Diagnosis not present

## 2021-02-02 NOTE — Telephone Encounter (Signed)
Called and spoke with patient, malodorous drainage from hip wound with a second wound drainage--will need wound vac.  Saw surgery and may need further surgery.  Exceedingly weak.

## 2021-02-05 ENCOUNTER — Inpatient Hospital Stay (HOSPITAL_COMMUNITY)
Admission: EM | Admit: 2021-02-05 | Discharge: 2021-02-13 | DRG: 857 | Disposition: A | Discharge status: Rehabilitation Facility | Payer: Medicare Other | Attending: Internal Medicine | Admitting: Internal Medicine

## 2021-02-05 ENCOUNTER — Other Ambulatory Visit: Payer: Self-pay

## 2021-02-05 ENCOUNTER — Other Ambulatory Visit: Payer: Self-pay | Admitting: Orthopaedic Surgery

## 2021-02-05 ENCOUNTER — Encounter (HOSPITAL_COMMUNITY): Payer: Self-pay | Admitting: *Deleted

## 2021-02-05 DIAGNOSIS — Z8616 Personal history of COVID-19: Secondary | ICD-10-CM | POA: Diagnosis not present

## 2021-02-05 DIAGNOSIS — Z8572 Personal history of non-Hodgkin lymphomas: Secondary | ICD-10-CM

## 2021-02-05 DIAGNOSIS — Z602 Problems related to living alone: Secondary | ICD-10-CM | POA: Diagnosis not present

## 2021-02-05 DIAGNOSIS — I110 Hypertensive heart disease with heart failure: Secondary | ICD-10-CM | POA: Diagnosis not present

## 2021-02-05 DIAGNOSIS — I480 Paroxysmal atrial fibrillation: Secondary | ICD-10-CM | POA: Diagnosis not present

## 2021-02-05 DIAGNOSIS — I5032 Chronic diastolic (congestive) heart failure: Secondary | ICD-10-CM | POA: Diagnosis not present

## 2021-02-05 DIAGNOSIS — Z79890 Hormone replacement therapy (postmenopausal): Secondary | ICD-10-CM

## 2021-02-05 DIAGNOSIS — Z8249 Family history of ischemic heart disease and other diseases of the circulatory system: Secondary | ICD-10-CM

## 2021-02-05 DIAGNOSIS — Z9884 Bariatric surgery status: Secondary | ICD-10-CM | POA: Diagnosis not present

## 2021-02-05 DIAGNOSIS — Z85118 Personal history of other malignant neoplasm of bronchus and lung: Secondary | ICD-10-CM | POA: Diagnosis not present

## 2021-02-05 DIAGNOSIS — I871 Compression of vein: Secondary | ICD-10-CM | POA: Diagnosis not present

## 2021-02-05 DIAGNOSIS — Z800 Family history of malignant neoplasm of digestive organs: Secondary | ICD-10-CM

## 2021-02-05 DIAGNOSIS — T8131XA Disruption of external operation (surgical) wound, not elsewhere classified, initial encounter: Secondary | ICD-10-CM | POA: Diagnosis present

## 2021-02-05 DIAGNOSIS — L02416 Cutaneous abscess of left lower limb: Secondary | ICD-10-CM | POA: Diagnosis not present

## 2021-02-05 DIAGNOSIS — C8592 Non-Hodgkin lymphoma, unspecified, intrathoracic lymph nodes: Secondary | ICD-10-CM | POA: Diagnosis not present

## 2021-02-05 DIAGNOSIS — I69320 Aphasia following cerebral infarction: Secondary | ICD-10-CM | POA: Diagnosis not present

## 2021-02-05 DIAGNOSIS — E890 Postprocedural hypothyroidism: Secondary | ICD-10-CM | POA: Diagnosis present

## 2021-02-05 DIAGNOSIS — Z1624 Resistance to multiple antibiotics: Secondary | ICD-10-CM | POA: Diagnosis present

## 2021-02-05 DIAGNOSIS — T8130XA Disruption of wound, unspecified, initial encounter: Secondary | ICD-10-CM | POA: Diagnosis not present

## 2021-02-05 DIAGNOSIS — Z853 Personal history of malignant neoplasm of breast: Secondary | ICD-10-CM

## 2021-02-05 DIAGNOSIS — Z2239 Carrier of other specified bacterial diseases: Secondary | ICD-10-CM | POA: Diagnosis not present

## 2021-02-05 DIAGNOSIS — I10 Essential (primary) hypertension: Secondary | ICD-10-CM | POA: Diagnosis present

## 2021-02-05 DIAGNOSIS — Z6833 Body mass index (BMI) 33.0-33.9, adult: Secondary | ICD-10-CM | POA: Diagnosis not present

## 2021-02-05 DIAGNOSIS — Z85850 Personal history of malignant neoplasm of thyroid: Secondary | ICD-10-CM | POA: Diagnosis not present

## 2021-02-05 DIAGNOSIS — I951 Orthostatic hypotension: Secondary | ICD-10-CM | POA: Diagnosis present

## 2021-02-05 DIAGNOSIS — E785 Hyperlipidemia, unspecified: Secondary | ICD-10-CM | POA: Diagnosis not present

## 2021-02-05 DIAGNOSIS — W19XXXD Unspecified fall, subsequent encounter: Secondary | ICD-10-CM | POA: Diagnosis not present

## 2021-02-05 DIAGNOSIS — E039 Hypothyroidism, unspecified: Secondary | ICD-10-CM | POA: Diagnosis not present

## 2021-02-05 DIAGNOSIS — G4733 Obstructive sleep apnea (adult) (pediatric): Secondary | ICD-10-CM | POA: Diagnosis present

## 2021-02-05 DIAGNOSIS — Z950 Presence of cardiac pacemaker: Secondary | ICD-10-CM

## 2021-02-05 DIAGNOSIS — T8142XA Infection following a procedure, deep incisional surgical site, initial encounter: Secondary | ICD-10-CM | POA: Diagnosis present

## 2021-02-05 DIAGNOSIS — A499 Bacterial infection, unspecified: Secondary | ICD-10-CM | POA: Diagnosis not present

## 2021-02-05 DIAGNOSIS — L089 Local infection of the skin and subcutaneous tissue, unspecified: Principal | ICD-10-CM

## 2021-02-05 DIAGNOSIS — Z9181 History of falling: Secondary | ICD-10-CM | POA: Diagnosis not present

## 2021-02-05 DIAGNOSIS — S2241XD Multiple fractures of ribs, right side, subsequent encounter for fracture with routine healing: Secondary | ICD-10-CM | POA: Diagnosis not present

## 2021-02-05 DIAGNOSIS — Z888 Allergy status to other drugs, medicaments and biological substances status: Secondary | ICD-10-CM

## 2021-02-05 DIAGNOSIS — R296 Repeated falls: Secondary | ICD-10-CM | POA: Diagnosis present

## 2021-02-05 DIAGNOSIS — Z833 Family history of diabetes mellitus: Secondary | ICD-10-CM

## 2021-02-05 DIAGNOSIS — Z9221 Personal history of antineoplastic chemotherapy: Secondary | ICD-10-CM

## 2021-02-05 DIAGNOSIS — Z96651 Presence of right artificial knee joint: Secondary | ICD-10-CM | POA: Diagnosis present

## 2021-02-05 DIAGNOSIS — M170 Bilateral primary osteoarthritis of knee: Secondary | ICD-10-CM | POA: Diagnosis not present

## 2021-02-05 DIAGNOSIS — B952 Enterococcus as the cause of diseases classified elsewhere: Secondary | ICD-10-CM | POA: Diagnosis not present

## 2021-02-05 DIAGNOSIS — R238 Other skin changes: Secondary | ICD-10-CM | POA: Diagnosis not present

## 2021-02-05 DIAGNOSIS — Z923 Personal history of irradiation: Secondary | ICD-10-CM

## 2021-02-05 DIAGNOSIS — Z20822 Contact with and (suspected) exposure to covid-19: Secondary | ICD-10-CM | POA: Diagnosis present

## 2021-02-05 DIAGNOSIS — S71002D Unspecified open wound, left hip, subsequent encounter: Secondary | ICD-10-CM | POA: Diagnosis not present

## 2021-02-05 DIAGNOSIS — Z7901 Long term (current) use of anticoagulants: Secondary | ICD-10-CM | POA: Diagnosis not present

## 2021-02-05 DIAGNOSIS — Z1611 Resistance to penicillins: Secondary | ICD-10-CM | POA: Diagnosis present

## 2021-02-05 DIAGNOSIS — D62 Acute posthemorrhagic anemia: Secondary | ICD-10-CM | POA: Diagnosis not present

## 2021-02-05 DIAGNOSIS — Z884 Allergy status to anesthetic agent status: Secondary | ICD-10-CM

## 2021-02-05 DIAGNOSIS — K5900 Constipation, unspecified: Secondary | ICD-10-CM | POA: Diagnosis present

## 2021-02-05 DIAGNOSIS — E669 Obesity, unspecified: Secondary | ICD-10-CM | POA: Diagnosis not present

## 2021-02-05 DIAGNOSIS — M79652 Pain in left thigh: Secondary | ICD-10-CM | POA: Diagnosis not present

## 2021-02-05 DIAGNOSIS — R5381 Other malaise: Secondary | ICD-10-CM | POA: Diagnosis not present

## 2021-02-05 DIAGNOSIS — Z8673 Personal history of transient ischemic attack (TIA), and cerebral infarction without residual deficits: Secondary | ICD-10-CM

## 2021-02-05 DIAGNOSIS — I96 Gangrene, not elsewhere classified: Secondary | ICD-10-CM | POA: Diagnosis present

## 2021-02-05 DIAGNOSIS — Y838 Other surgical procedures as the cause of abnormal reaction of the patient, or of later complication, without mention of misadventure at the time of the procedure: Secondary | ICD-10-CM | POA: Diagnosis present

## 2021-02-05 DIAGNOSIS — Z96611 Presence of right artificial shoulder joint: Secondary | ICD-10-CM | POA: Diagnosis present

## 2021-02-05 DIAGNOSIS — Z6832 Body mass index (BMI) 32.0-32.9, adult: Secondary | ICD-10-CM | POA: Diagnosis not present

## 2021-02-05 DIAGNOSIS — S52045D Nondisplaced fracture of coronoid process of left ulna, subsequent encounter for closed fracture with routine healing: Secondary | ICD-10-CM | POA: Diagnosis not present

## 2021-02-05 DIAGNOSIS — E6609 Other obesity due to excess calories: Secondary | ICD-10-CM | POA: Diagnosis present

## 2021-02-05 DIAGNOSIS — Z79899 Other long term (current) drug therapy: Secondary | ICD-10-CM

## 2021-02-05 DIAGNOSIS — I4892 Unspecified atrial flutter: Secondary | ICD-10-CM | POA: Diagnosis not present

## 2021-02-05 DIAGNOSIS — T148XXA Other injury of unspecified body region, initial encounter: Secondary | ICD-10-CM | POA: Diagnosis not present

## 2021-02-05 DIAGNOSIS — I4819 Other persistent atrial fibrillation: Secondary | ICD-10-CM | POA: Diagnosis present

## 2021-02-05 DIAGNOSIS — E876 Hypokalemia: Secondary | ICD-10-CM | POA: Diagnosis not present

## 2021-02-05 DIAGNOSIS — I498 Other specified cardiac arrhythmias: Secondary | ICD-10-CM | POA: Diagnosis not present

## 2021-02-05 DIAGNOSIS — S7002XS Contusion of left hip, sequela: Secondary | ICD-10-CM | POA: Diagnosis not present

## 2021-02-05 DIAGNOSIS — I4891 Unspecified atrial fibrillation: Secondary | ICD-10-CM | POA: Diagnosis present

## 2021-02-05 DIAGNOSIS — I890 Lymphedema, not elsewhere classified: Secondary | ICD-10-CM | POA: Diagnosis not present

## 2021-02-05 DIAGNOSIS — A498 Other bacterial infections of unspecified site: Secondary | ICD-10-CM | POA: Diagnosis not present

## 2021-02-05 DIAGNOSIS — E782 Mixed hyperlipidemia: Secondary | ICD-10-CM | POA: Diagnosis present

## 2021-02-05 LAB — RESP PANEL BY RT-PCR (FLU A&B, COVID) ARPGX2
Influenza A by PCR: NEGATIVE
Influenza B by PCR: NEGATIVE
SARS Coronavirus 2 by RT PCR: NEGATIVE

## 2021-02-05 LAB — CBC WITH DIFFERENTIAL/PLATELET
Abs Immature Granulocytes: 0.02 10*3/uL (ref 0.00–0.07)
Basophils Absolute: 0.1 10*3/uL (ref 0.0–0.1)
Basophils Relative: 1 %
Eosinophils Absolute: 0.1 10*3/uL (ref 0.0–0.5)
Eosinophils Relative: 1 %
HCT: 37.6 % (ref 36.0–46.0)
Hemoglobin: 11.2 g/dL — ABNORMAL LOW (ref 12.0–15.0)
Immature Granulocytes: 0 %
Lymphocytes Relative: 8 %
Lymphs Abs: 0.7 10*3/uL (ref 0.7–4.0)
MCH: 30.2 pg (ref 26.0–34.0)
MCHC: 29.8 g/dL — ABNORMAL LOW (ref 30.0–36.0)
MCV: 101.3 fL — ABNORMAL HIGH (ref 80.0–100.0)
Monocytes Absolute: 1.2 10*3/uL — ABNORMAL HIGH (ref 0.1–1.0)
Monocytes Relative: 15 %
Neutro Abs: 6 10*3/uL (ref 1.7–7.7)
Neutrophils Relative %: 75 %
Platelets: 253 10*3/uL (ref 150–400)
RBC: 3.71 MIL/uL — ABNORMAL LOW (ref 3.87–5.11)
RDW: 16.8 % — ABNORMAL HIGH (ref 11.5–15.5)
WBC: 8 10*3/uL (ref 4.0–10.5)
nRBC: 0 % (ref 0.0–0.2)

## 2021-02-05 LAB — COMPREHENSIVE METABOLIC PANEL
ALT: 21 U/L (ref 0–44)
AST: 23 U/L (ref 15–41)
Albumin: 3.2 g/dL — ABNORMAL LOW (ref 3.5–5.0)
Alkaline Phosphatase: 77 U/L (ref 38–126)
Anion gap: 9 (ref 5–15)
BUN: 17 mg/dL (ref 8–23)
CO2: 23 mmol/L (ref 22–32)
Calcium: 8.9 mg/dL (ref 8.9–10.3)
Chloride: 105 mmol/L (ref 98–111)
Creatinine, Ser: 1 mg/dL (ref 0.44–1.00)
GFR, Estimated: 58 mL/min — ABNORMAL LOW (ref >60)
Glucose, Bld: 103 mg/dL — ABNORMAL HIGH (ref 70–99)
Potassium: 4.1 mmol/L (ref 3.5–5.1)
Sodium: 137 mmol/L (ref 135–145)
Total Bilirubin: 0.8 mg/dL (ref 0.3–1.2)
Total Protein: 6.3 g/dL — ABNORMAL LOW (ref 6.5–8.1)

## 2021-02-05 LAB — LACTIC ACID, PLASMA: Lactic Acid, Venous: 1.1 mmol/L (ref 0.5–1.9)

## 2021-02-05 LAB — PROTIME-INR
INR: 1.6 — ABNORMAL HIGH (ref 0.8–1.2)
Prothrombin Time: 18.9 seconds — ABNORMAL HIGH (ref 11.4–15.2)

## 2021-02-05 MED ORDER — FLUDROCORTISONE ACETATE 0.1 MG PO TABS
0.0500 mg | ORAL_TABLET | Freq: Two times a day (BID) | ORAL | Status: DC
  Administered 2021-02-06 – 2021-02-13 (×15): 0.05 mg via ORAL
  Filled 2021-02-05 (×18): qty 0.5

## 2021-02-05 MED ORDER — MORPHINE SULFATE (PF) 2 MG/ML IV SOLN
2.0000 mg | INTRAVENOUS | Status: DC | PRN
  Administered 2021-02-06 – 2021-02-09 (×2): 2 mg via INTRAVENOUS
  Filled 2021-02-05 (×3): qty 1

## 2021-02-05 MED ORDER — LEVOTHYROXINE SODIUM 25 MCG PO TABS
137.0000 ug | ORAL_TABLET | ORAL | Status: DC
  Administered 2021-02-06 – 2021-02-13 (×7): 137 ug via ORAL
  Filled 2021-02-05 (×10): qty 1

## 2021-02-05 MED ORDER — MIDODRINE HCL 5 MG PO TABS
5.0000 mg | ORAL_TABLET | Freq: Two times a day (BID) | ORAL | Status: DC
  Administered 2021-02-05 – 2021-02-13 (×16): 5 mg via ORAL
  Filled 2021-02-05 (×16): qty 1

## 2021-02-05 MED ORDER — ROSUVASTATIN CALCIUM 5 MG PO TABS
10.0000 mg | ORAL_TABLET | ORAL | Status: DC
  Administered 2021-02-08 – 2021-02-12 (×2): 10 mg via ORAL
  Filled 2021-02-05 (×2): qty 2

## 2021-02-05 MED ORDER — ONDANSETRON HCL 4 MG PO TABS
4.0000 mg | ORAL_TABLET | Freq: Four times a day (QID) | ORAL | Status: DC | PRN
Start: 2021-02-05 — End: 2021-02-13

## 2021-02-05 MED ORDER — ACETAMINOPHEN 325 MG PO TABS
650.0000 mg | ORAL_TABLET | Freq: Four times a day (QID) | ORAL | Status: DC | PRN
  Administered 2021-02-07 – 2021-02-13 (×5): 650 mg via ORAL
  Filled 2021-02-05 (×5): qty 2

## 2021-02-05 MED ORDER — AMIODARONE HCL 200 MG PO TABS
200.0000 mg | ORAL_TABLET | Freq: Every day | ORAL | Status: DC
  Administered 2021-02-06 – 2021-02-13 (×8): 200 mg via ORAL
  Filled 2021-02-05 (×8): qty 1

## 2021-02-05 MED ORDER — DOCUSATE SODIUM 100 MG PO CAPS
100.0000 mg | ORAL_CAPSULE | Freq: Two times a day (BID) | ORAL | Status: DC
  Administered 2021-02-05 – 2021-02-13 (×13): 100 mg via ORAL
  Filled 2021-02-05 (×14): qty 1

## 2021-02-05 MED ORDER — BISACODYL 5 MG PO TBEC
5.0000 mg | DELAYED_RELEASE_TABLET | Freq: Every day | ORAL | Status: DC | PRN
  Administered 2021-02-09 – 2021-02-10 (×2): 5 mg via ORAL
  Filled 2021-02-05 (×2): qty 1

## 2021-02-05 MED ORDER — ACETAMINOPHEN 650 MG RE SUPP: 650.0000 mg | Freq: Four times a day (QID) | RECTAL | Status: DC | PRN

## 2021-02-05 MED ORDER — POLYETHYLENE GLYCOL 3350 17 G PO PACK
17.0000 g | PACK | Freq: Every day | ORAL | Status: DC | PRN
  Administered 2021-02-09: 17 g via ORAL
  Filled 2021-02-05: qty 1

## 2021-02-05 MED ORDER — TRAZODONE HCL 50 MG PO TABS: 25.0000 mg | ORAL_TABLET | Freq: Every evening | ORAL | Status: DC | PRN

## 2021-02-05 MED ORDER — LACTATED RINGERS IV SOLN: INTRAVENOUS | Status: DC

## 2021-02-05 MED ORDER — LIFITEGRAST 5 % OP SOLN: 1.0000 [drp] | Freq: Every day | OPHTHALMIC | Status: DC

## 2021-02-05 MED ORDER — ENSURE PRE-SURGERY PO LIQD
296.0000 mL | Freq: Once | ORAL | Status: AC
  Administered 2021-02-05: 296 mL via ORAL
  Filled 2021-02-05: qty 296

## 2021-02-05 MED ORDER — HYDRALAZINE HCL 20 MG/ML IJ SOLN: 5.0000 mg | INTRAMUSCULAR | Status: DC | PRN

## 2021-02-05 MED ORDER — LEVOTHYROXINE SODIUM 25 MCG PO TABS
68.5000 ug | ORAL_TABLET | ORAL | Status: DC
  Administered 2021-02-10: 68.5 ug via ORAL
  Filled 2021-02-05 (×3): qty 1

## 2021-02-05 MED ORDER — ONDANSETRON HCL 4 MG/2ML IJ SOLN
4.0000 mg | Freq: Four times a day (QID) | INTRAMUSCULAR | Status: DC | PRN
  Administered 2021-02-07 – 2021-02-10 (×3): 4 mg via INTRAVENOUS
  Filled 2021-02-05 (×4): qty 2

## 2021-02-05 MED ORDER — LEVOTHYROXINE SODIUM 137 MCG PO TABS: 68.5000 ug | ORAL_TABLET | Freq: Every day | ORAL | Status: DC

## 2021-02-05 MED ORDER — HYDROCODONE-ACETAMINOPHEN 5-325 MG PO TABS
1.0000 | ORAL_TABLET | ORAL | Status: DC | PRN
  Administered 2021-02-06 – 2021-02-08 (×3): 2 via ORAL
  Filled 2021-02-05 (×3): qty 2

## 2021-02-05 NOTE — ED Provider Notes (Signed)
[place] EMERGENCY DEPARTMENT Provider Note   CSN: [id number] Arrival date & time: 02/05/21  1127     History Chief Complaint  Patient presents with  . Wound Infection    Kayla Harrison is a 75 y.o. female presenting for evaluation of wound infection.   Pt states she had surgery with Dr. Lucia Gaskins on her left hip the end of March.  Since then, the wound has not healed.  About a week ago, it started get significantly worse, getting larger, bleeding more, draining more, and has worsened now.  She does have increased pain at the area, although not a significant amount.  She denies fevers, chills, nausea, vomiting.  She reports worsening orthostatic hypotension, although this is not new for her.  She reports generalized weakness and shortness of breath with exertion.  No shortness of breath with rest.  She denies chest pain, urinary symptoms, normal bowel movements.  She has been on Bactrim for a week.  She was also recently on nitrofurantoin for a UTI, the symptoms have resolved.  She called Dr. Lucia Gaskins today, who sent her to the ER for further evaluation, feels she may need surgery again.  She is on Eliquis, has been taking it as prescribed.  She lives at home by herself, uses a walker to get around.  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    HPI     Past Medical History:  Diagnosis Date  . Anemia   . Arthritis    osteoarthritis - knees and right shoulder  . Blood transfusion without reported diagnosis   . Breast cancer ([place])    Dr Margot Chimes, total thyroidectomy- 1999- for cancer  . 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 Chronic bilateral pleural effusions   . Colon polyp    Dr Earlean Shawl  . Complication of anesthesia    Ketamine produces LSD reaction, bright colored nightmarish experience   . Dyslipidemia   . Endometriosis   . Fibroids   . H/O pleural effusion    s/p thoracentesis w 3230m withdrawn  . Hepatitis    [place] ?hepatitis   . History  of dysphagia    due to radiation therapy  . History of hiatal hernia    small noted on PET scan  . Hypertension   . Hypothyroidism   . Lung cancer, lower lobe ([place] 01[place]   radiation RX completed 03/04/17; will start chemo 6/27, pt unaware of lung cancer  . Morbid obesity ([place]    Status post lap band surgery  . Nephrolithiasis   . Non Hodgkin's lymphoma ([place]    on chemotherapy  . Persistent atrial fibrillation ([place]    a. s/p PVI 2008 b. s/p convergent ablation [id number] by bradycardia requiring pacemaker implant  . Personal history of radiation therapy   . Presence of permanent cardiac pacemaker   . Rotator cuff tear    Right  . Stroke ([facility]    [place]  . SVC syndrome    with lung mass and non hodgkins lymphoma  . Thyroid cancer ([facility] 2000    Patient Active Problem List   Diagnosis Date Noted  . Physical debility 12/22/2020  . Tear of left hamstring 12/22/2020  . Labral tear of left hip joint 12/22/2020  . Acute blood loss anemia   . Orthostatic hypotension 12/17/2020  . History of CVA (cerebrovascular accident) 12/17/2020  . Fall 12/15/2020  . Hip hematoma, left, initial encounter 12/15/2020  . Nondisplaced fracture  of coronoid process of left ulna, initial encounter for closed fracture 12/15/2020  . Multiple rib fractures 12/15/2020  . COVID-19 virus infection 10/12/2020  . Constipation 06/22/2020  . Change in bowel habits 06/22/2020  . Diverticulosis 06/22/2020  . Dark stools 06/22/2020  . Hemorrhoids 06/22/2020  . Pacemaker 01/23/2020  . Junctional rhythm 01/23/2020  . Palpitations 01/23/2020  . ICH (intracerebral hemorrhage) ([place]) 12/02/2019  . A-fib ([place]) 12/02/2019  . Anemia 12/02/2019  . QT prolongation 12/02/2019  . Hypothyroidism 12/02/2019  . Gait abnormality 07/10/2018  . S/P reverse total shoulder arthroplasty, 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  . Persistent atrial fibrillation ([place])   . Bilateral ureteral calculi 11/17/2017  . Atrial  fibrillation with RVR ([place]) 09/15/2017  . Atrial flutter ([place]) 07/17/2017  . Port catheter in place 04/02/2017  . Non-Hodgkin lymphoma, unspecified, intrathoracic lymph nodes ([place]) 02/13/2017  . Mediastinal mass 02/06/2017  . 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 Abnormal chest x-ray   . Lung mass 02/03/2017  . 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  . OSA (obstructive sleep apnea) 02/03/2015  . History of renal calculi 11/16/2014  . Renal calculi 11/16/2014  . Family history of colon cancer 10/26/2014  . Mechanical complication due to cardiac pacemaker pulse generator 11/06/2013  . Dyspnea 05/12/2013  . Hypothyroidism 04/21/2013  . Pleural effusion 04/19/2013  . Long term (current) use of anticoagulants 12/20/2010  . EPIDERMOID CYST 08/22/2010  . Chronic diastolic heart failure ([place]) 08/06/2010  . SYNCOPE AND COLLAPSE 07/27/2010  . OSTEOARTHRITIS, KNEE, RIGHT 03/15/2010  . Morbid obesity ([place]) 12/07/2009  . NONSPEC ELEVATION OF LEVELS OF TRANSAMINASE/LDH 09/08/2009  . BREAST CANCER, HX OF 07/25/2009  . COLONIC POLYPS, HX OF 07/25/2009  . TUBULOVILLOUS ADENOMA, COLON 04/29/2008  . HYPERGLYCEMIA, FASTING 04/29/2008  . HYPERLIPIDEMIA 06/22/2007  . CARCINOMA, THYROID GLAND, HX OF 06/22/2007    Past Surgical History:  Procedure Laterality Date  . ABDOMINAL HYSTERECTOMY  1983  . afib ablation     a. 2008 PVI b. 2014 convergent ablation  . APPENDECTOMY    . BONE MARROW BIOPSY  02/21/2017  . BREAST LUMPECTOMY Left 2010  . bso  1998  . CARDIAC CATHETERIZATION     2015- negative  . CARDIOVERSION  10/09/2012   Procedure: CARDIOVERSION;  Surgeon: Minus Breeding, MD;  Location: [place];  Service: Cardiovascular;  Laterality: N/A;  . CARDIOVERSION  10/09/2012   Procedure: CARDIOVERSION;  Surgeon: Minus Breeding, MD;  Location: [facility] ENDOSCOPY;  Service: Cardiovascular;  Laterality: N/A;  [name] gave the ok to add pt to the add on , but we must check to find out if the can add pt  on at [phone] ( [phone])  . CARDIOVERSION N/A 11/20/2012   Procedure: CARDIOVERSION;  Surgeon: Fay Records, MD;  Location: [place];  Service: Cardiovascular;  Laterality: N/A;  . CARDIOVERSION N/A 07/18/2017   Procedure: CARDIOVERSION;  Surgeon: Thayer Headings, MD;  Location: [facility] ENDOSCOPY;  Service: Cardiovascular;  Laterality: N/A;  . CARDIOVERSION N/A 10/03/2017   Procedure: CARDIOVERSION;  Surgeon: Sanda Klein, MD;  Location: [facility] ENDOSCOPY;  Service: Cardiovascular;  Laterality: N/A;  . CARDIOVERSION N/A 01/07/2018   Procedure: CARDIOVERSION;  Surgeon: Thayer Headings, MD;  Location: [facility] ENDOSCOPY;  Service: Cardiovascular;  Laterality: N/A;  . CARDIOVERSION N/A 12/10/2019   Procedure: CARDIOVERSION;  Surgeon: Buford Dresser, MD;  Location: [facility] ENDOSCOPY;  Service: Cardiovascular;  Laterality: N/A;  . CHOLECYSTECTOMY    . COLONOSCOPY W/ POLYPECTOMY     Dr Earlean Shawl  . CYSTOSCOPY N/A 02/06/2015  Procedure: CYSTOSCOPY;  Surgeon: Kathie Rhodes, MD;  Location: [facility] ORS;  Service: Urology;  Laterality: N/A;  . CYSTOSCOPY W/ RETROGRADES Left 11/17/2017   Procedure: CYSTOSCOPY WITH RETROGRADE /PYELOGRAM/;  Surgeon: Kathie Rhodes, MD;  Location: [facility]S;  Service: Urology;  Laterality: Left;  . CYSTOSCOPY WITH RETROGRADE PYELOGRAM, URETEROSCOPY AND STENT PLACEMENT Right 02/06/2015   Procedure: RETROGRADE PYELOGRAM, RIGHT URETEROSCOPY STENT PLACEMENT;  Surgeon: Kathie Rhodes, MD;  Location: [facility]S;  Service: Urology;  Laterality: Right;  . CYSTOSCOPY WITH RETROGRADE PYELOGRAM, URETEROSCOPY AND STENT PLACEMENT Right 03/07/2017   Procedure: CYSTOSCOPY WITH RIGHT RETROGRADE PYELOGRAM,RIGHT  URETEROSCOPYLASER LITHOTRIPSY  AND STENT PLACEMENT AND STONE BASKETRY;  Surgeon: Kathie Rhodes, MD;  Location: [place];  Service: Urology;  Laterality: Right;  . EYE SURGERY     cataract surgery  . fatty mass removal  1999   pubic area  . HOLMIUM LASER APPLICATION N/A 2/99/3716   Procedure:  HOLMIUM LASER APPLICATION;  Surgeon: Kathie Rhodes, MD;  Location: [facility];  Service: Urology;  Laterality: N/A;  . HOLMIUM LASER APPLICATION Right 9/67/8938   Procedure: HOLMIUM LASER APPLICATION;  Surgeon: Kathie Rhodes, MD;  Location: [facility];  Service: Urology;  Laterality: Right;  . HOLMIUM LASER APPLICATION Left 09/23/7508   Procedure: HOLMIUM LASER APPLICATION;  Surgeon: Kathie Rhodes, MD;  Location: [facility];  Service: Urology;  Laterality: Left;  . I & D EXTREMITY Left 12/19/2020   Procedure: IRRIGATION AND DEBRIDEMENT OF LEFT HIP HEMATOMA WITH APPLICATION OF WOUND VAC;  Surgeon: Erle Crocker, MD;  Location: [place];  Service: Orthopedics;  Laterality: Left;  . IR FLUORO GUIDE PORT INSERTION RIGHT  02/24/2017  . IR NEPHROSTOMY PLACEMENT RIGHT  11/17/2017  . IR PATIENT EVAL TECH 0-60 MINS  03/11/2017  . IR REMOVAL TUN ACCESS W/ PORT W/O FL MOD SED  04/20/2018  . IR US GUIDE VASC ACCESS RIGHT  02/24/2017  . KNEE ARTHROSCOPY     bilateral  . LAPAROSCOPIC GASTRIC BANDING  07/10/2010  . LAPAROSCOPIC GASTRIC BANDING     Laparoscopic adjustable banding APS System with posterior hiatal hernia, 2 suture.  Marland Kitchen LAPAROTOMY     for ruptured ovary and ovarian artery   . NEPHROLITHOTOMY Right 11/17/2017   Procedure: NEPHROLITHOTOMY PERCUTANEOUS;  Surgeon: Kathie Rhodes, MD;  [facility];  Service: Urology;  Laterality: Right;  . PACEMAKER INSERTION  03/10/2013   MDT dual chamber PPM  . POCKET REVISION N/A 12/08/2013   Procedure: POCKET REVISION;  Surgeon: Deboraha Sprang, MD;  Location: [facility] CATH LAB;  Service: Cardiovascular;  Laterality: N/A;  . PORTA CATH INSERTION    . REVERSE SHOULDER ARTHROPLASTY Right 05/14/2018   Procedure: RIGHT REVERSE SHOULDER ARTHROPLASTY;  Surgeon: Tania Ade, MD;  Location: [place];  Service: Orthopedics;  Laterality: Right;  . REVERSE SHOULDER REPLACEMENT Right 05/14/2018  . RIGHT HEART CATH N/A 07/21/2019   Procedure: RIGHT HEART CATH;  Surgeon:  Larey Dresser, MD;  Location: [place] CV LAB;  Service: Cardiovascular;  Laterality: N/A;  . TEE WITH CARDIOVERSION  09/22/2017  . TEE WITHOUT CARDIOVERSION N/A 10/03/2017   Procedure: TRANSESOPHAGEAL ECHOCARDIOGRAM (TEE);  Surgeon: Sanda Klein, MD;  Location: [facility] ENDOSCOPY;  Service: Cardiovascular;  Laterality: N/A;  . TEE WITHOUT CARDIOVERSION N/A 08/04/2019   Procedure: TRANSESOPHAGEAL ECHOCARDIOGRAM (TEE);  Surgeon: Larey Dresser, MD;  Location: [facility] ENDOSCOPY;  Service: Cardiovascular;  Laterality: N/A;  . TEE WITHOUT CARDIOVERSION N/A 12/10/2019   Procedure: TRANSESOPHAGEAL ECHOCARDIOGRAM (TEE);  Surgeon: Buford Dresser, MD;  Location: [facility] ENDOSCOPY;  Service: Cardiovascular;  Laterality: N/A;  . THYROIDECTOMY  1998   Dr Margot Chimes  . TONSILLECTOMY    . TOTAL KNEE ARTHROPLASTY  04/13/2012   Procedure: TOTAL KNEE ARTHROPLASTY;  Surgeon: Rudean Haskell, MD;  Location: [place];  Service: Orthopedics;  Laterality: Right;  Marland Kitchen VIDEO BRONCHOSCOPY WITH ENDOBRONCHIAL ULTRASOUND N/A 02/07/2017   Procedure: VIDEO BRONCHOSCOPY WITH ENDOBRONCHIAL ULTRASOUND;  Surgeon: Marshell Garfinkel, MD;  Location: [place];  Service: Pulmonary;  Laterality: N/A;     OB History   No obstetric history on file.     Family History  Problem Relation Age of Onset  . Heart disease Father 88       MI _0   . Colon cancer Father        COLON  . Heart attack Father   . Other Mother        temporal arteritis   . Diabetes Sister   . Diabetes Brother   . Diabetes Paternal Aunt   . Diabetes Paternal Grandmother   . Esophageal cancer Neg Hx   . Inflammatory bowel disease Neg Hx   . Liver disease Neg Hx   . Pancreatic cancer Neg Hx   . Stomach cancer Neg Hx     Social History   Tobacco Use  . Smoking status: Never Smoker  . Smokeless tobacco: Never Used  Vaping Use  . Vaping Use: Never used  Substance Use Topics  . Alcohol use: No    Comment: none since 1990  . Drug use: Never    Home  Medications Prior to Admission medications   Medication Sig Start Date End Date Taking? Authorizing Provider  acetaminophen (TYLENOL) 325 MG tablet Take 2 tablets (650 mg total) by mouth every 4 (four) hours as needed for mild pain. 01/09/21   Love, Ivan Anchors, PA-C  amiodarone (PACERONE) 200 MG tablet Take 1 tablet (200 mg total) by mouth daily. 03/28/20   Deboraha Sprang, MD  ELIQUIS 5 MG TABS tablet TAKE 1 TABLET BY MOUTH  TWICE DAILY Patient taking differently: Take 5 mg by mouth 2 (two) times daily. 06/13/20   Deboraha Sprang, MD  ferrous sulfate 325 (65 FE) MG tablet Take 1 tablet (325 mg total) by mouth daily with breakfast. 01/10/21   Love, Ivan Anchors, PA-C  fludrocortisone (FLORINEF) 0.1 MG tablet Take 0.5 tablets (0.05 mg total) by mouth 2 (two) times daily with a meal. 01/09/21   Love, Ivan Anchors, PA-C  furosemide (LASIX) 20 MG tablet Take 20 mg by mouth daily as needed for fluid or edema.  07/21/19   [provider]  furosemide (LASIX) 20 MG tablet Take 1 tablet (20 mg total) by mouth daily. 01/18/21   Raulkar, Clide Deutscher, MD  levothyroxine (SYNTHROID) 137 MCG tablet Take 68.5-137 mcg by mouth See admin instructions. Taking 137 mcg tablet daily except on Saturday taking 1/2 tablet    [provider]  Lifitegrast 5 % SOLN Place 1 drop into both eyes at bedtime.     [provider]  midodrine (PROAMATINE) 5 MG tablet Take 1 tablet (5 mg total) by mouth 2 (two) times daily with a meal. 01/09/21   Love, Ivan Anchors, PA-C  Multiple Vitamins-Minerals (ONE-A-DAY WOMENS 50+ ADVANTAGE) TABS Take 1 tablet by mouth daily with breakfast.    [provider]  rosuvastatin (CRESTOR) 10 MG tablet Take 10 mg by mouth 2 (two) times a week. Takes on Monday and Thursday 06/30/18   [provider]  traZODone (DESYREL) 50 MG tablet Take 0.5 tablets (25 mg total) by mouth at bedtime as needed for sleep. Patient not taking: Reported on 01/18/2021 01/09/21   Bary Leriche, PA-C     Allergies    Dofetilide, Ranolazine, Rivaroxaban, Rivaroxaban, Tape, Tikosyn [dofetilide], Epinephrine, Epinephrine, Ketamine, Adhesive [tape], and Ketamine  Review of Systems   Review of Systems  Respiratory: Positive for shortness of breath (with exertion).   Skin: Positive for color change and wound.  Neurological: Positive for dizziness (with standing) and weakness.  Hematological: Bruises/bleeds easily.  All other systems reviewed and are negative.   Physical Exam Updated Vital Signs BP (!) 153/66   Pulse 71   Temp 98.4 F (36.9 C) (Oral)   Resp (!) 28   SpO2 99%   Physical Exam Vitals and nursing note reviewed.  Constitutional:      General: She is not in acute distress.    Appearance: She is well-developed.     Comments: Resting in the bed in no acute distress  HENT:     Head: Normocephalic and atraumatic.  Eyes:     Extraocular Movements: Extraocular movements intact.     Conjunctiva/sclera: Conjunctivae normal.     Pupils: Pupils are equal, round, and reactive to light.  Cardiovascular:     Rate and Rhythm: Normal rate and regular rhythm.     Pulses: Normal pulses.  Pulmonary:     Effort: Pulmonary effort is normal. No respiratory distress.     Breath sounds: Normal breath sounds. No wheezing.  Abdominal:     General: There is no distension.     Palpations: Abdomen is soft. There is no mass.     Tenderness: There is no abdominal tenderness. There is no guarding or rebound.  Musculoskeletal:     Cervical back: Normal range of motion and neck supple.     Comments: See picture below.  Large wound of the left lateral hip.  Bloody drainage.  Foul smell.  Skin:    General: Skin is warm and dry.     Capillary Refill: Capillary refill takes less than 2 seconds.  Neurological:     Mental Status: She is alert and oriented to person, place, and time.              ED Results / Procedures / Treatments   Labs (all labs ordered are listed, but only  abnormal results are displayed) Labs Reviewed  CBC WITH DIFFERENTIAL/PLATELET - Abnormal; Notable for the following components:      Result Value   RBC 3.71 (*)    Hemoglobin 11.2 (*)    MCV 101.3 (*)    MCHC 29.8 (*)    RDW 16.8 (*)    Monocytes Absolute 1.2 (*)    All other components within normal limits  COMPREHENSIVE METABOLIC PANEL - Abnormal; Notable for the following components:   Glucose, Bld 103 (*)    Total Protein 6.3 (*)    Albumin 3.2 (*)    GFR, Estimated 58 (*)    All other components within normal limits  PROTIME-INR - Abnormal; Notable for the following components:   Prothrombin Time 18.9 (*)    INR 1.6 (*)    All other components within normal limits  CULTURE, BLOOD (ROUTINE X 2)  CULTURE, BLOOD (ROUTINE X 2)  AEROBIC CULTURE W GRAM STAIN (SUPERFICIAL SPECIMEN)  RESP PANEL BY RT-PCR (FLU A&B, COVID) ARPGX2  LACTIC ACID, PLASMA  LACTIC ACID, PLASMA    EKG None  Radiology  No results found.  Procedures Procedures   Medications Ordered in ED Medications - No data to display  ED Course  I have reviewed the triage vital signs and the nursing notes.  Pertinent labs & imaging results that were available during my care of the patient were reviewed by me and considered in my medical decision making (see chart for details).    MDM Rules/Calculators/A&P                          Patient presenting for evaluation of worsening wound of the left hip.  On exam, patient peers nontoxic.  Vital signs are stable.  Doubt sepsis.  However wound is consistent with infection and that it has worsening draining, smell, and enlarging size.  She has failed outpatient antibiotics.  Will discuss with orthopedics, per patient will likely need to be admitted.  Labs obtained from triage interpreted by me, overall reassuring.  No significant leukocytosis.  Hemoglobin improved from baseline.  Discussed with Jacqulynn Cadet, PA-C with orthopedics who has evaluated the patient.   Recommends medicine admission and Ortho will consult.  Discussed with Dr. Lorin Mercy from [facility] hospitalist service, patient to be admitted.  Final Clinical Impression(s) / ED Diagnoses Final diagnoses:  Wound infection    Rx / DC Orders ED Discharge Orders    None       Franchot Heidelberg, PA-C 02/05/21 1619    Horton, Alvin Critchley, DO 02/05/21 1623

## 2021-02-05 NOTE — ED Provider Notes (Addendum)
Emergency Medicine Provider Triage Evaluation Note  Kayla Harrison , a 75 y.o. female  was evaluated in triage.  Pt complains of sent in by Dr. Lucia Gaskins for concern of expanding hematoma on anticoags.  Previous hemoglobin was less than 8 and she required transfusion.  Patient is currently on Eliquis 5 mg twice daily.  Review of Systems  Positive: Left hip pain Negative: Trauma, fever  Physical Exam  BP (!) 150/73 (BP Location: Left Arm)   Pulse 70   Temp 98.4 F (36.9 C) (Oral)   Resp 17   SpO2 100%  Gen:   Awake, no distress   Resp:  Normal effort  MSK:   Moves extremities without difficulty  Other:  Surrounding erythema, active bleeding from the left hip with brown discharge.  Foul odor to the wound, tender to palpation with streaking noted.  Medical Decision Making  Medically screening exam initiated at 1:44 PM.  Appropriate orders placed.  Akiva Josey Truax was informed that the remainder of the evaluation will be completed by another provider, this initial triage assessment does not replace that evaluation, and the importance of remaining in the ED until their evaluation is complete.  Patient here for dehiscing of her left hip wound.  Called by Dr. Lucia Gaskins There this morning, advised to be seen in the ED.  Had last hemoglobin drawn about a month ago which was less than 8.  Had labs redrawn on Friday by does not have any results.  She endorses overall fatigue, no dizziness or states "I feel very rundown".  Reports wound has been bleeding badly, has been changing to dressings daily.  Tachycardia noted on today's arrival.   1:44 PM Spoke to Silvestre Gunner PA, Ortho who recommended admission via hospitalist. No need for Imaging at this time.    Janeece Fitting, PA-C 02/05/21 [address], PA-C 02/05/21 1531    Lucrezia Starch, MD 02/06/21 1001

## 2021-02-05 NOTE — Anesthesia Preprocedure Evaluation (Addendum)
Anesthesia Evaluation  Patient identified by MRN, date of birth, ID band Patient awake    Reviewed: Allergy & Precautions, NPO status , Patient's Chart, lab work & pertinent test results  Airway Mallampati: III  TM Distance: >3 FB Neck ROM: Full    Dental  (+) Teeth Intact   Pulmonary sleep apnea ,  Lung Ca    Pulmonary exam normal        Cardiovascular hypertension, pulmonary hypertension+ dysrhythmias (on Eliquis ) Atrial Fibrillation + pacemaker (due to bradycardia post afib ablation)  Rhythm:Regular Rate:Normal  SVC  Syndrome   ECHO 12/10/19:  1. TEE without evidence of thrombus. Proceeded to successful  cardioversion.  2. Left ventricular ejection fraction, by estimation, is 55 to 60%. The  left ventricle has normal function. The left ventricle has no regional  wall motion abnormalities. Left ventricular diastolic function could not  be evaluated.  3. Right ventricular systolic function is normal. The right ventricular  size is normal. There is moderately elevated pulmonary artery systolic  pressure.  4. No left atrial/left atrial appendage thrombus was detected.  5. The mitral valve is degenerative. Mild mitral valve regurgitation. No  evidence of mitral stenosis.  6. Tricuspid valve regurgitation is mild to moderate.  7. The aortic valve is tricuspid. Aortic valve regurgitation is trivial.  No aortic stenosis is present.  8. There is mild (Grade II) plaque involving the descending aorta.    Neuro/Psych CVA (2002), Residual Symptoms    GI/Hepatic Neg liver ROS, hiatal hernia,   Endo/Other  Hypothyroidism Thyroid Ca s/p total thyroidectomy 1999  Renal/GU negative Renal ROS  negative genitourinary   Musculoskeletal  (+) Arthritis , S/p fall 12/15/20 with left hip hematoma    Abdominal (+)  Abdomen: soft. Bowel sounds: normal.  Peds  Hematology  (+) Blood dyscrasia, anemia , NHL   Anesthesia Other  Findings   Reproductive/Obstetrics negative OB ROS                           Anesthesia Physical  Anesthesia Plan  ASA: III  Anesthesia Plan: General   Post-op Pain Management:    Induction: Intravenous  PONV Risk Score and Plan: 3 and Ondansetron and Treatment may vary due to age or medical condition  Airway Management Planned: Oral ETT  Additional Equipment: None  Intra-op Plan:   Post-operative Plan: Extubation in OR  Informed Consent: I have reviewed the patients History and Physical, chart, labs and discussed the procedure including the risks, benefits and alternatives for the proposed anesthesia with the patient or authorized representative who has indicated his/her understanding and acceptance.     Dental advisory given  Plan Discussed with: CRNA and Anesthesiologist  Anesthesia Plan Comments:       Anesthesia Quick Evaluation

## 2021-02-05 NOTE — Telephone Encounter (Signed)
CAlled and spoke with surgery.  If necessary can stop anticoagulation but otherwise, with prior CVA, would not.  There are no data for 1/2 dose Apixoban  either way, in terms of reduced bleeding or benefit. I also described for him the malodour.  He notes it could be old blood which was what he expressed from the wound when he saw the patient.  But he is not sanguine that this will heal without repeat surgical intervention.   He will follow and he has my cell

## 2021-02-05 NOTE — Progress Notes (Signed)
Patient ID: Kayla Harrison, female   DOB: Feb 17, 1946, 75 y.o.   MRN: [id number]   LOS: 0 days   Subjective: [name] returns to the ED with continued issues with her left hip hematoma. She has been struggling at home with fatigue and some exertional SOB. She has been struggling to change her dressings when the [facility] is not there (she's only coming 2d/week). About 3 days ago the would began to drain bloody discharge and she was worried she was bleeding again. She has only had minimal pain and denies fevers, chills, sweats, N/V.   Objective: Vital signs in last 24 hours: Temp:  [98.4 F (36.9 C)] 98.4 F (36.9 C) (05/16 1157) Pulse Rate:  [70] 70 (05/16 1157) Resp:  [17] 17 (05/16 1157) BP: (150)/(73) 150/73 (05/16 1157) SpO2:  [100 %] 100 % (05/16 1157)     Laboratory  CBC Recent Labs    02/05/21 1333  WBC 8.0  HGB 11.2*  HCT 37.6  PLT 253   BMET Recent Labs    02/05/21 1333  NA 137  K 4.1  CL 105  CO2 23  GLUCOSE 103*  BUN 17  CREATININE 1.00  CALCIUM 8.9     Physical Exam General appearance: alert and no distress       Assessment/Plan: Left hip wound -- Would like medicine to see her to evaluate fatigue and SOB as it doesn't seem as if it's from anemia. If cleared from their standpoint would likely return to OR for repeat I&D. Her cardiologist has agreed to stopping her Eliquis if necessary for wound healing. Dr. Lucia Gaskins to see later today or tomorrow.    Lisette Abu, PA-C Orthopedic Surgery [phone] 02/05/2021

## 2021-02-05 NOTE — ED Notes (Signed)
Unable to collect 2nd blood culture at this time. PA informed.

## 2021-02-05 NOTE — ED Triage Notes (Signed)
Pt has wound to left hip, sent here by pcp due to increase in drainage, foul odor and large blood clots.

## 2021-02-05 NOTE — H&P (Signed)
History and Physical    Kayla Harrison XJO:[id number] DOB: [birth year] DOA: 02/05/2021  PCP: [name], MD Consultants:  Ander Slade - pulmonology; Lindi Adie - oncology; Caryl Comes - cardiology; Mansouraty - GI Patient coming from:  Home - lives alone; NOK: Younger sister, [name], [phone]   Chief Complaint: Recurrent wound hematoma/infection  HPI: Kayla Harrison is a 75 y.o. female with medical history significant of thyroid cancer -> hypothyroidism; NHL with SVC syndrome; pacemaker placement; afib on Eliquis; obesity s/p lap band surgery; HTN; and HLD presenting with hematoma.  She was initially admitted by me on 3/25 and was discharged to CIR on 4/1.  At the time of admission, her recurrent falls were thought to be due to orthostatic hypotension and she was started on abdominal binder, TED hose, and Florinef as per cardiology.  She had a L hip hematoma that was evacuated; she was on heparin and was stable and so was resumed on her Eliquis.  She remained in CIR through 4/19.  She returned to the ER on 4/20 for "open wounds to left thigh that were bleeding from prior hematoma I&D"; she was not actively bleeding and Hgb was stable so she was discharged home.  She was seen by wound care on 5/11 and Dr. Lucia Gaskins did not think evacuation was needed at that time; she was discharged on antibiotics.  She has had no improvement and has been draining blood and clots continuously in addition to very malodorous purulent drainage.  No fevers.  +fatigue, mild SOB.     ED Course:  Infected L hip wound.  Malodorous drainage and pain x 1 week.  Increased weakness, no concern for sepsis.  On Bactrim x 1 week, also took Nitrofurantoin for UTI.  Ortho is consulting.   Review of Systems: As per HPI; otherwise review of systems reviewed and negative.    Ambulatory Status:  Ambulates with a walker  COVID Vaccine Status:   Complete plus booster  Past Medical History:  Diagnosis Date  . Anemia   . Arthritis     osteoarthritis - knees and right shoulder  . Blood transfusion without reported diagnosis   . Breast cancer ([place])    Dr Margot Chimes, total thyroidectomy- 1999- for cancer  . 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 Chronic bilateral pleural effusions   . Colon polyp    Dr Earlean Shawl  . Complication of anesthesia    Ketamine produces LSD reaction, bright colored nightmarish experience   . Dyslipidemia   . Endometriosis   . Fibroids   . H/O pleural effusion    s/p thoracentesis w 3224m withdrawn  . Hepatitis    [place] ?hepatitis   . History of dysphagia    due to radiation therapy  . History of hiatal hernia    small noted on PET scan  . Hypertension   . Hypothyroidism   . Lung cancer, [place] lobe ([facility]   radiation RX completed 03/04/17; will start chemo 6/27, pt unaware of lung cancer  . Morbid obesity ([facility]    Status post lap band surgery  . Nephrolithiasis   . Non Hodgkin's lymphoma ([facility]    on chemotherapy  . Persistent atrial fibrillation ([place]    a. s/p PVI 2008 b. s/p convergent ablation [id number] by bradycardia requiring pacemaker implant  . Personal history of radiation therapy   . Presence of permanent cardiac pacemaker   . Rotator cuff tear    Right  . Stroke ([facility]  2003- [place] x2  . SVC syndrome    with lung mass and non hodgkins lymphoma  . Thyroid cancer ([place]) 2000    Past Surgical History:  Procedure Laterality Date  . ABDOMINAL HYSTERECTOMY  1983  . afib ablation     a. 2008 PVI b. 2014 convergent ablation  . APPENDECTOMY    . BONE MARROW BIOPSY  02/21/2017  . BREAST LUMPECTOMY Left 2010  . bso  1998  . CARDIAC CATHETERIZATION     2015- negative  . CARDIOVERSION  10/09/2012   Procedure: CARDIOVERSION;  Surgeon: Minus Breeding, MD;  Location: [place];  Service: Cardiovascular;  Laterality: N/A;  . CARDIOVERSION  10/09/2012   Procedure: CARDIOVERSION;  Surgeon: Minus Breeding, MD;  Location: [facility] ENDOSCOPY;  Service:  Cardiovascular;  Laterality: N/A;  [name] gave the ok to add pt to the add on , but we must check to find out if the can add pt on at [phone] ( [phone])  . CARDIOVERSION N/A 11/20/2012   Procedure: CARDIOVERSION;  Surgeon: Fay Records, MD;  Location: [place];  Service: Cardiovascular;  Laterality: N/A;  . CARDIOVERSION N/A 07/18/2017   Procedure: CARDIOVERSION;  Surgeon: [place] Headings, MD;  Location: [facility] ENDOSCOPY;  Service: Cardiovascular;  Laterality: N/A;  . CARDIOVERSION N/A 10/03/2017   Procedure: CARDIOVERSION;  Surgeon: Sanda Klein, MD;  Location: [facility] ENDOSCOPY;  Service: Cardiovascular;  Laterality: N/A;  . CARDIOVERSION N/A 01/07/2018   Procedure: CARDIOVERSION;  Surgeon: [place] Headings, MD;  Location: [facility] ENDOSCOPY;  Service: Cardiovascular;  Laterality: N/A;  . CARDIOVERSION N/A 12/10/2019   Procedure: CARDIOVERSION;  Surgeon: Buford Dresser, MD;  Location: [facility] ENDOSCOPY;  Service: Cardiovascular;  Laterality: N/A;  . CHOLECYSTECTOMY    . COLONOSCOPY W/ POLYPECTOMY     Dr Earlean Shawl  . CYSTOSCOPY N/A 02/06/2015   Procedure: CYSTOSCOPY;  Surgeon: Kathie Rhodes, MD;  Location: [facility] ORS;  Service: Urology;  Laterality: N/A;  . CYSTOSCOPY W/ RETROGRADES Left 11/17/2017   Procedure: CYSTOSCOPY WITH RETROGRADE /PYELOGRAM/;  Surgeon: Kathie Rhodes, MD;  Location: [facility] ORS;  Service: Urology;  Laterality: Left;  . CYSTOSCOPY WITH RETROGRADE PYELOGRAM, URETEROSCOPY AND STENT PLACEMENT Right 02/06/2015   Procedure: RETROGRADE PYELOGRAM, RIGHT URETEROSCOPY STENT PLACEMENT;  Surgeon: Kathie Rhodes, MD;  Location: [facility] ORS;  Service: Urology;  Laterality: Right;  . CYSTOSCOPY WITH RETROGRADE PYELOGRAM, URETEROSCOPY AND STENT PLACEMENT Right 03/07/2017   Procedure: CYSTOSCOPY WITH RIGHT RETROGRADE PYELOGRAM,RIGHT  URETEROSCOPYLASER LITHOTRIPSY  AND STENT PLACEMENT AND STONE BASKETRY;  Surgeon: Kathie Rhodes, MD;  Location: [place];  Service: Urology;  Laterality: Right;  . EYE SURGERY      cataract surgery  . fatty mass removal  1999   pubic area  . HOLMIUM LASER APPLICATION N/A 3/71/0626   Procedure: HOLMIUM LASER APPLICATION;  Surgeon: Kathie Rhodes, MD;  Location: [facility];  Service: Urology;  Laterality: N/A;  . HOLMIUM LASER APPLICATION Right 9/48/5462   Procedure: HOLMIUM LASER APPLICATION;  Surgeon: Kathie Rhodes, MD;  Location: [facility];  Service: Urology;  Laterality: Right;  . HOLMIUM LASER APPLICATION Left 03/25/5008   Procedure: HOLMIUM LASER APPLICATION;  Surgeon: Kathie Rhodes, MD;  Location: [facility] ORS;  Service: Urology;  Laterality: Left;  . I & D EXTREMITY Left 12/19/2020   Procedure: IRRIGATION AND DEBRIDEMENT OF LEFT HIP HEMATOMA WITH APPLICATION OF WOUND VAC;  Surgeon: Erle Crocker, MD;  Location: [place];  Service: Orthopedics;  Laterality: Left;  . IR FLUORO GUIDE PORT INSERTION RIGHT  02/24/2017  .  IR NEPHROSTOMY PLACEMENT RIGHT  11/17/2017  . IR PATIENT EVAL TECH 0-60 MINS  03/11/2017  . IR REMOVAL TUN ACCESS W/ PORT W/O FL MOD SED  04/20/2018  . IR US GUIDE VASC ACCESS RIGHT  02/24/2017  . KNEE ARTHROSCOPY     bilateral  . LAPAROSCOPIC GASTRIC BANDING  07/10/2010  . LAPAROSCOPIC GASTRIC BANDING     Laparoscopic adjustable banding APS System with posterior hiatal hernia, 2 suture.  Marland Kitchen LAPAROTOMY     for ruptured ovary and ovarian artery   . NEPHROLITHOTOMY Right 11/17/2017   Procedure: NEPHROLITHOTOMY PERCUTANEOUS;  Surgeon: Kathie Rhodes, MD;  [facility]: [facility];  Service: Urology;  Laterality: Right;  . PACEMAKER INSERTION  03/10/2013   MDT dual chamber PPM  . POCKET REVISION N/A 12/08/2013   Procedure: POCKET REVISION;  Surgeon: Deboraha Sprang, MD;  [facility] LAB;  Service: Cardiovascular;  Laterality: N/A;  . PORTA CATH INSERTION    . REVERSE SHOULDER ARTHROPLASTY Right 05/14/2018   Procedure: RIGHT REVERSE SHOULDER ARTHROPLASTY;  Surgeon: Tania Ade, MD;  Location: [place];  Service: Orthopedics;  Laterality: Right;  .  REVERSE SHOULDER REPLACEMENT Right 05/14/2018  . RIGHT HEART CATH N/A 07/21/2019   Procedure: RIGHT HEART CATH;  Surgeon: Larey Dresser, MD;  Location: [place] CV LAB;  Service: Cardiovascular;  Laterality: N/A;  . TEE WITH CARDIOVERSION  09/22/2017  . TEE WITHOUT CARDIOVERSION N/A 10/03/2017   Procedure: TRANSESOPHAGEAL ECHOCARDIOGRAM (TEE);  Surgeon: Sanda Klein, MD;  Location: [facility] ENDOSCOPY;  Service: Cardiovascular;  Laterality: N/A;  . TEE WITHOUT CARDIOVERSION N/A 08/04/2019   Procedure: TRANSESOPHAGEAL ECHOCARDIOGRAM (TEE);  Surgeon: Larey Dresser, MD;  Location: [facility] ENDOSCOPY;  Service: Cardiovascular;  Laterality: N/A;  . TEE WITHOUT CARDIOVERSION N/A 12/10/2019   Procedure: TRANSESOPHAGEAL ECHOCARDIOGRAM (TEE);  Surgeon: Buford Dresser, MD;  Location: [facility] ENDOSCOPY;  Service: Cardiovascular;  Laterality: N/A;  . THYROIDECTOMY  1998   Dr Margot Chimes  . TONSILLECTOMY    . TOTAL KNEE ARTHROPLASTY  04/13/2012   Procedure: TOTAL KNEE ARTHROPLASTY;  Surgeon: Rudean Haskell, MD;  Location: [place];  Service: Orthopedics;  Laterality: Right;  Marland Kitchen VIDEO BRONCHOSCOPY WITH ENDOBRONCHIAL ULTRASOUND N/A 02/07/2017   Procedure: VIDEO BRONCHOSCOPY WITH ENDOBRONCHIAL ULTRASOUND;  Surgeon: Marshell Garfinkel, MD;  Location: [address];  Service: Pulmonary;  Laterality: N/A;    Social History   Socioeconomic History  . Marital status: Single    Spouse name: Not on file  . Number of children: 0  . Years of education: Not on file  . Highest education level: Master's degree (e.g., MA, MS, MEng, MEd, MSW, MBA)  Occupational History  . 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 Smoking status: Never Smoker  . Smokeless tobacco: Never Used  Vaping Use  . Vaping Use: Never used  Substance and Sexual Activity  . Alcohol use: No    Comment: none since 1990  . Drug use: Never  . Sexual activity: Not Currently    Birth control/protection: Surgical   Other Topics Concern  . Not on file  Social History Narrative   ** Merged History Encounter **       SINGLE  NO CHILDREN NEVER SMOKED EXERCISE 3 X WK RETIRED NO CAFFEINE      Social Determinants of Health   Financial Resource Strain: Not on file  Food Insecurity: Not on file  Transportation Needs: Not on file  Physical Activity: Not on file  Stress: Not on  file  Social Connections: Not on file  Intimate Partner Violence: Not on file    Allergies  Allergen Reactions  . Dofetilide Other (See Comments)    Prolonged QT interval   . Ranolazine     Balance issues  . Rivaroxaban Other (See Comments)    Nose Bleed X 6 hrs ; packing in ER Nasal hemorrhage  . Rivaroxaban     Nose Bleed X 6 hrs ; packing in ER Nasal hemorrhage  . Tape Other (See Comments)    SKIN IS THIN AND TEARS EASILY   . Tikosyn [Dofetilide] Other (See Comments)    "Long QT wave"   . Epinephrine Other (See Comments)    Oral anesthetic Dental form only (liquid). Patient stated she will become "out of it" she can hear you but cannot respond in normal fashion. "not fully with it"  . Epinephrine   . Ketamine Other (See Comments)    Hallucinations   . Adhesive [Tape] Rash    Paper tape as well  . Ketamine Other (See Comments)    HALLUCINATIONS    Family History  Problem Relation Age of Onset  . Heart disease Father 81       MI _0   . Colon cancer Father        COLON  . Heart attack Father   . Other Mother        temporal arteritis   . Diabetes Sister   . Diabetes Brother   . Diabetes Paternal Aunt   . Diabetes Paternal Grandmother   . Esophageal cancer Neg Hx   . Inflammatory bowel disease Neg Hx   . Liver disease Neg Hx   . Pancreatic cancer Neg Hx   . Stomach cancer Neg Hx     Prior to Admission medications   Medication Sig Start Date End Date Taking? Authorizing Provider  acetaminophen (TYLENOL) 325 MG tablet Take 2 tablets (650 mg total) by mouth every 4 (four) hours as  needed for mild pain. 01/09/21   Love, Ivan Anchors, PA-C  amiodarone (PACERONE) 200 MG tablet Take 1 tablet (200 mg total) by mouth daily. 03/28/20   Deboraha Sprang, MD  ELIQUIS 5 MG TABS tablet TAKE 1 TABLET BY MOUTH  TWICE DAILY Patient taking differently: Take 5 mg by mouth 2 (two) times daily. 06/13/20   Deboraha Sprang, MD  ferrous sulfate 325 (65 FE) MG tablet Take 1 tablet (325 mg total) by mouth daily with breakfast. 01/10/21   Love, Ivan Anchors, PA-C  fludrocortisone (FLORINEF) 0.1 MG tablet Take 0.5 tablets (0.05 mg total) by mouth 2 (two) times daily with a meal. 01/09/21   Love, Ivan Anchors, PA-C  furosemide (LASIX) 20 MG tablet Take 20 mg by mouth daily as needed for fluid or edema.  07/21/19   [provider]  furosemide (LASIX) 20 MG tablet Take 1 tablet (20 mg total) by mouth daily. 01/18/21   Raulkar, Clide Deutscher, MD  levothyroxine (SYNTHROID) 137 MCG tablet Take 68.5-137 mcg by mouth See admin instructions. Taking 137 mcg tablet daily except on Saturday taking 1/2 tablet    [provider]  Lifitegrast 5 % SOLN Place 1 drop into both eyes at bedtime.     [provider]  midodrine (PROAMATINE) 5 MG tablet Take 1 tablet (5 mg total) by mouth 2 (two) times daily with a meal. 01/09/21   Love, Ivan Anchors, PA-C  Multiple Vitamins-Minerals (ONE-A-DAY WOMENS 50+ ADVANTAGE) TABS Take 1 tablet by mouth daily with  breakfast.    [provider]  rosuvastatin (CRESTOR) 10 MG tablet Take 10 mg by mouth 2 (two) times a week. Takes on Monday and Thursday 06/30/18   [provider]  traZODone (DESYREL) 50 MG tablet Take 0.5 tablets (25 mg total) by mouth at bedtime as needed for sleep. Patient not taking: Reported on 01/18/2021 01/09/21   Bary Leriche, PA-C    Physical Exam: Vitals:   02/05/21 1157 02/05/21 1558  BP: (!) 150/73 (!) 153/66  Pulse: 70 71  Resp: 17 (!) 28  Temp: 98.4 F (36.9 C)   TempSrc: Oral   SpO2: 100% 99%     . General:  Appears calm and  comfortable and is in NAD . Eyes:  PERRL, EOMI, normal lids, iris . ENT:  grossly normal hearing, lips & tongue, mmm . Neck:  no LAD, masses or thyromegaly . Cardiovascular:  RRR, no m/r/g. No LE edema.  Marland Kitchen Respiratory:   CTA bilaterally with no wheezes/rales/rhonchi.  Normal to mildly increased respiratory effort. . Abdomen:  soft, NT, ND . Skin:  Large L hip buttock ecchymosis with underlying hematoma; the wound appears to be quite deep and very foul-smelling       . Musculoskeletal:  grossly normal tone BUE/BLE, good ROM, no bony abnormality . Psychiatric:  grossly normal mood and affect, speech fluent and appropriate, AOx3 . Neurologic:  CN 2-12 grossly intact, moves all extremities in coordinated fashion    Radiological Exams on Admission: Independently reviewed - see discussion in A/P where applicable  No results found.  EKG: not done   Labs on Admission: I have personally reviewed the available labs and imaging studies at the time of the admission.  Pertinent labs:   BUN 17/Creatinine 1.00/GFR 58 - stable Albumin 3.2 Lactate 1.1 WBC 8.0 Hgb 11.2 INR 1.6   Assessment/Plan Principal Problem:   Infection of wound hematoma Active Problems:   HYPERLIPIDEMIA   Class 1 obesity due to excess calories with body mass index (BMI) of 33.0 to 33.9 in adult   Hypothyroidism   A-fib (HCC)   Pacemaker   Orthostatic hypotension    L hip hematoma -Patient with recurrent falls due to gradually worsening weakness, orthostatic hypotension -Also with ? Of cognitive impairment - patient denies but her sister acknowledged this at the time of last hospitalization -She had I&D of L hip hematoma on 3/29 but has had recurrent issues -Recently treated with Bactrim without improvement -Orthopedics is consulting, appears likely to need repeat I&D and wound vac - planned for tomorrow AM -Will admit to Med Surg -TOC consult for possible increased HH assistance -Hold abx for now  pending ortho deep cultures - although blood cultures and superficial wound cultures are already pending -If she spikes a fever overnight or otherwise appears to be getting sicker, will need initiation of broad spectrum abx sooner  Afib -Hold Eliquis given large hematoma -Patient reports that Dr. Caryl Comes has agreed to low dose Eliquis vs. None for now -Continue Amio for rate control -Has pacemaker -SCDs for DVT prevention and hold AC for now -Ideally, Eliquis will be held long enough to allow for wound healing; possibly 81 mg ASA in the interim  Orthostatic hypotension -Started on midodrine and Florinef during prior hospitalization, will continue  HLD -Continue Crestor  Hypothyroidism -Normal TSH in 07/2020 -Continue Synthroid at current dose for now  Class 1 obesity -Body mass index is 33.6 kg/m.  -She is s/p lap band -Ongoing weight loss should be encouraged -  Outpatient PCP/bariatric medicine f/u encouraged  H/o lymphoma, in remission -NHL with large mediastinal mass involving thyroid/lungs -Seen by Dr. Lindi Adie in 08/2020 and no longer needs routine imaging -Annual f/u    Note: This patient has been tested and is pending for the novel coronavirus COVID-19. The patient has been fully vaccinated against COVID-19.    Level of care: Med-Surg DVT prophylaxis: SCDs Code Status:  Full - confirmed with patient Family Communication: None present Disposition Plan:  The patient is from: home  Anticipated d/c is to: home with [facility] The services   Anticipated d/c date will depend on clinical response to treatment, likely 2-3 days  Patient is currently: acutely ill Consults called: Orthopedics, [place] team Admission status:  Admit - It is my clinical opinion that admission to [place] is reasonable and necessary because of the expectation that this patient will require hospital care that crosses at least 2 midnights to treat this condition based on the medical complexity of the  problems presented.  Given the aforementioned information, the predictability of an adverse outcome is felt to be significant.    Karmen Bongo MD [facility]   How to contact the [facility] Attending or Consulting provider [place] or covering provider during after hours [place], for this patient?  1. Check the care team in [facility] and look for a) attending/consulting TR[facility] provider listed and b) the [facility] team listed 2. Log into [email or web address] and use [place]'s universal password to access. If you do not have the password, please contact the hospital operator. 3. Locate the [facility] provider you are looking for under [facility] and page to a number that you can be directly reached. 4. If you still have difficulty reaching the provider, please page the [facility] (Director on Call) for the Hospitalists listed on amion for assistance.   02/05/2021, 5:06 PM

## 2021-02-06 ENCOUNTER — Inpatient Hospital Stay (HOSPITAL_COMMUNITY): Payer: Medicare Other | Admitting: Anesthesiology

## 2021-02-06 ENCOUNTER — Encounter (HOSPITAL_COMMUNITY)
Admission: EM | Disposition: A | Discharge status: Rehabilitation Facility | Payer: Self-pay | Source: Home / Self Care | Attending: Internal Medicine

## 2021-02-06 DIAGNOSIS — I951 Orthostatic hypotension: Secondary | ICD-10-CM | POA: Diagnosis not present

## 2021-02-06 DIAGNOSIS — T148XXA Other injury of unspecified body region, initial encounter: Secondary | ICD-10-CM | POA: Diagnosis not present

## 2021-02-06 DIAGNOSIS — Z950 Presence of cardiac pacemaker: Secondary | ICD-10-CM

## 2021-02-06 DIAGNOSIS — E890 Postprocedural hypothyroidism: Secondary | ICD-10-CM | POA: Diagnosis not present

## 2021-02-06 HISTORY — PX: I & D EXTREMITY: SHX5045

## 2021-02-06 LAB — BASIC METABOLIC PANEL
Anion gap: 5 (ref 5–15)
BUN: 16 mg/dL (ref 8–23)
CO2: 25 mmol/L (ref 22–32)
Calcium: 8.1 mg/dL — ABNORMAL LOW (ref 8.9–10.3)
Chloride: 106 mmol/L (ref 98–111)
Creatinine, Ser: 0.93 mg/dL (ref 0.44–1.00)
GFR, Estimated: >60 mL/min (ref >60)
Glucose, Bld: 89 mg/dL (ref 70–99)
Potassium: 4.4 mmol/L (ref 3.5–5.1)
Sodium: 136 mmol/L (ref 135–145)

## 2021-02-06 LAB — SURGICAL PCR SCREEN
MRSA, PCR: NEGATIVE
Staphylococcus aureus: NEGATIVE

## 2021-02-06 LAB — CBC
HCT: 30.9 % — ABNORMAL LOW (ref 36.0–46.0)
Hemoglobin: 9.7 g/dL — ABNORMAL LOW (ref 12.0–15.0)
MCH: 30.9 pg (ref 26.0–34.0)
MCHC: 31.4 g/dL (ref 30.0–36.0)
MCV: 98.4 fL (ref 80.0–100.0)
Platelets: 221 10*3/uL (ref 150–400)
RBC: 3.14 MIL/uL — ABNORMAL LOW (ref 3.87–5.11)
RDW: 16.8 % — ABNORMAL HIGH (ref 11.5–15.5)
WBC: 6.5 10*3/uL (ref 4.0–10.5)
nRBC: 0 % (ref 0.0–0.2)

## 2021-02-06 SURGERY — IRRIGATION AND DEBRIDEMENT EXTREMITY
Anesthesia: General | Site: Thigh | Laterality: Left

## 2021-02-06 MED ORDER — FENTANYL CITRATE (PF) 100 MCG/2ML IJ SOLN
25.0000 ug | INTRAMUSCULAR | Status: DC | PRN
  Administered 2021-02-06: 50 ug via INTRAVENOUS

## 2021-02-06 MED ORDER — CEFAZOLIN SODIUM-DEXTROSE 2-4 GM/100ML-% IV SOLN
2.0000 g | INTRAVENOUS | Status: AC
  Administered 2021-02-06: 2 g via INTRAVENOUS
  Filled 2021-02-06: qty 100

## 2021-02-06 MED ORDER — 0.9 % SODIUM CHLORIDE (POUR BTL) OPTIME
TOPICAL | Status: DC | PRN
  Administered 2021-02-06: 1000 mL

## 2021-02-06 MED ORDER — LACTATED RINGERS IV SOLN: INTRAVENOUS | Status: DC

## 2021-02-06 MED ORDER — ORAL CARE MOUTH RINSE: 15.0000 mL | Freq: Once | OROMUCOSAL | Status: AC

## 2021-02-06 MED ORDER — OXYCODONE HCL 5 MG/5ML PO SOLN: 5.0000 mg | Freq: Once | ORAL | Status: AC | PRN

## 2021-02-06 MED ORDER — SODIUM CHLORIDE 0.9 % IV SOLN
1.5000 g | Freq: Four times a day (QID) | INTRAVENOUS | Status: DC
  Administered 2021-02-06 – 2021-02-07 (×4): 1.5 g via INTRAVENOUS
  Filled 2021-02-06 (×7): qty 4

## 2021-02-06 MED ORDER — PROMETHAZINE HCL 25 MG/ML IJ SOLN: 6.2500 mg | INTRAMUSCULAR | Status: DC | PRN

## 2021-02-06 MED ORDER — VANCOMYCIN HCL 1000 MG IV SOLR
INTRAVENOUS | Status: DC | PRN
  Administered 2021-02-06: 1500 mg via TOPICAL

## 2021-02-06 MED ORDER — FENTANYL CITRATE (PF) 100 MCG/2ML IJ SOLN
INTRAMUSCULAR | Status: DC | PRN
  Administered 2021-02-06 (×2): 50 ug via INTRAVENOUS

## 2021-02-06 MED ORDER — LACTATED RINGERS IV SOLN: INTRAVENOUS | Status: DC | PRN

## 2021-02-06 MED ORDER — MIDODRINE HCL 5 MG PO TABS
10.0000 mg | ORAL_TABLET | Freq: Once | ORAL | Status: AC
  Administered 2021-02-06: 10 mg via ORAL
  Filled 2021-02-06: qty 2

## 2021-02-06 MED ORDER — FENTANYL CITRATE (PF) 100 MCG/2ML IJ SOLN
INTRAMUSCULAR | Status: AC
  Administered 2021-02-06: 50 ug via INTRAVENOUS
  Filled 2021-02-06: qty 2

## 2021-02-06 MED ORDER — PHENYLEPHRINE HCL (PRESSORS) 10 MG/ML IV SOLN
INTRAVENOUS | Status: DC | PRN
  Administered 2021-02-06: 100 ug via INTRAVENOUS

## 2021-02-06 MED ORDER — OXYCODONE HCL 5 MG PO TABS
5.0000 mg | ORAL_TABLET | Freq: Once | ORAL | Status: AC | PRN
  Administered 2021-02-06: 5 mg via ORAL

## 2021-02-06 MED ORDER — SODIUM CHLORIDE 0.9 % IR SOLN
Status: DC | PRN
  Administered 2021-02-06: 6000 mL

## 2021-02-06 MED ORDER — CHLORHEXIDINE GLUCONATE 0.12 % MT SOLN: 15.0000 mL | Freq: Once | OROMUCOSAL | Status: AC

## 2021-02-06 MED ORDER — CHLORHEXIDINE GLUCONATE 0.12 % MT SOLN
OROMUCOSAL | Status: AC
  Administered 2021-02-06: 15 mL via OROMUCOSAL
  Filled 2021-02-06: qty 15

## 2021-02-06 MED ORDER — PROPOFOL 10 MG/ML IV BOLUS
INTRAVENOUS | Status: AC
  Filled 2021-02-06: qty 20

## 2021-02-06 MED ORDER — OXYCODONE HCL 5 MG PO TABS
ORAL_TABLET | ORAL | Status: AC
  Filled 2021-02-06: qty 1

## 2021-02-06 MED ORDER — SUGAMMADEX SODIUM 200 MG/2ML IV SOLN
INTRAVENOUS | Status: DC | PRN
  Administered 2021-02-06: 200 mg via INTRAVENOUS

## 2021-02-06 MED ORDER — SODIUM CHLORIDE 0.9 % IV BOLUS
1000.0000 mL | Freq: Once | INTRAVENOUS | Status: AC
  Administered 2021-02-06: 1000 mL via INTRAVENOUS

## 2021-02-06 MED ORDER — ONDANSETRON HCL 4 MG/2ML IJ SOLN
INTRAMUSCULAR | Status: DC | PRN
  Administered 2021-02-06: 4 mg via INTRAVENOUS

## 2021-02-06 MED ORDER — PHENYLEPHRINE HCL-NACL 10-0.9 MG/250ML-% IV SOLN
INTRAVENOUS | Status: DC | PRN
  Administered 2021-02-06: 50 ug/min via INTRAVENOUS

## 2021-02-06 MED ORDER — FENTANYL CITRATE (PF) 250 MCG/5ML IJ SOLN
INTRAMUSCULAR | Status: AC
  Filled 2021-02-06: qty 5

## 2021-02-06 MED ORDER — PROPOFOL 10 MG/ML IV BOLUS
INTRAVENOUS | Status: DC | PRN
  Administered 2021-02-06: 170 mg via INTRAVENOUS

## 2021-02-06 MED ORDER — VANCOMYCIN HCL 1000 MG IV SOLR
INTRAVENOUS | Status: AC
  Filled 2021-02-06: qty 1000

## 2021-02-06 MED ORDER — ROCURONIUM BROMIDE 10 MG/ML (PF) SYRINGE
PREFILLED_SYRINGE | INTRAVENOUS | Status: DC | PRN
  Administered 2021-02-06: 50 mg via INTRAVENOUS

## 2021-02-06 MED ORDER — VANCOMYCIN HCL 500 MG IV SOLR
INTRAVENOUS | Status: AC
  Filled 2021-02-06: qty 500

## 2021-02-06 MED ORDER — SODIUM CHLORIDE 0.9 % IV BOLUS
250.0000 mL | Freq: Once | INTRAVENOUS | Status: AC
  Administered 2021-02-06: 250 mL via INTRAVENOUS

## 2021-02-06 SURGICAL SUPPLY — 50 items
BANDAGE ESMARK 6X9 LF (GAUZE/BANDAGES/DRESSINGS) IMPLANT
BNDG CMPR 9X4 STRL LF SNTH (GAUZE/BANDAGES/DRESSINGS)
BNDG CMPR 9X6 STRL LF SNTH (GAUZE/BANDAGES/DRESSINGS)
BNDG CMPR MED 10X6 ELC LF (GAUZE/BANDAGES/DRESSINGS) ×1
BNDG COHESIVE 4X5 TAN STRL (GAUZE/BANDAGES/DRESSINGS) IMPLANT
BNDG ELASTIC 6X10 VLCR STRL LF (GAUZE/BANDAGES/DRESSINGS) ×2 IMPLANT
BNDG ESMARK 4X9 LF (GAUZE/BANDAGES/DRESSINGS) IMPLANT
BNDG ESMARK 6X9 LF (GAUZE/BANDAGES/DRESSINGS)
BNDG GAUZE ELAST 4 BULKY (GAUZE/BANDAGES/DRESSINGS) IMPLANT
COVER SURGICAL LIGHT HANDLE (MISCELLANEOUS) ×2 IMPLANT
COVER WAND RF STERILE (DRAPES) IMPLANT
CUFF TOURN SGL QUICK 34 (TOURNIQUET CUFF)
CUFF TRNQT CYL 34X4.125X (TOURNIQUET CUFF) IMPLANT
DRAIN CHANNEL 19F RND (DRAIN) ×4 IMPLANT
DRAPE U-SHAPE 47X51 STRL (DRAPES) ×4 IMPLANT
DRSG MEPILEX BORDER 4X12 (GAUZE/BANDAGES/DRESSINGS) ×2 IMPLANT
DRSG PAD ABDOMINAL 8X10 ST (GAUZE/BANDAGES/DRESSINGS) ×6 IMPLANT
DRSG XEROFORM 1X8 (GAUZE/BANDAGES/DRESSINGS) IMPLANT
DURAPREP 26ML APPLICATOR (WOUND CARE) IMPLANT
ELECT REM PT RETURN 9FT ADLT (ELECTROSURGICAL) ×2
ELECTRODE REM PT RTRN 9FT ADLT (ELECTROSURGICAL) ×1 IMPLANT
GAUZE SPONGE 4X4 12PLY STRL (GAUZE/BANDAGES/DRESSINGS) ×2 IMPLANT
GAUZE SPONGE 4X4 12PLY STRL LF (GAUZE/BANDAGES/DRESSINGS) IMPLANT
GAUZE XEROFORM 1X8 LF (GAUZE/BANDAGES/DRESSINGS) IMPLANT
GLOVE BIOGEL M STRL SZ7.5 (GLOVE) ×2 IMPLANT
GLOVE INDICATOR 8.0 STRL GRN (GLOVE) ×2 IMPLANT
GOWN STRL REUS W/ TWL LRG LVL3 (GOWN DISPOSABLE) ×1 IMPLANT
GOWN STRL REUS W/ TWL XL LVL3 (GOWN DISPOSABLE) ×1 IMPLANT
GOWN STRL REUS W/TWL LRG LVL3 (GOWN DISPOSABLE) ×2
GOWN STRL REUS W/TWL XL LVL3 (GOWN DISPOSABLE) ×2
HANDPIECE INTERPULSE COAX TIP (DISPOSABLE) ×2
KIT BASIN OR (CUSTOM PROCEDURE TRAY) ×2 IMPLANT
KIT TURNOVER KIT B (KITS) ×2 IMPLANT
MANIFOLD NEPTUNE II (INSTRUMENTS) ×2 IMPLANT
NEEDLE 22X1 1/2 (OR ONLY) (NEEDLE) IMPLANT
NS IRRIG 1000ML POUR BTL (IV SOLUTION) ×2 IMPLANT
PACK ORTHO EXTREMITY (CUSTOM PROCEDURE TRAY) ×2 IMPLANT
PAD ARMBOARD 7.5X6 YLW CONV (MISCELLANEOUS) ×4 IMPLANT
PAD CAST 4YDX4 CTTN HI CHSV (CAST SUPPLIES) IMPLANT
PADDING CAST COTTON 4X4 STRL (CAST SUPPLIES)
SET HNDPC FAN SPRY TIP SCT (DISPOSABLE) ×1 IMPLANT
SOL PREP POV-IOD 4OZ 10% (MISCELLANEOUS) ×2 IMPLANT
SOL PREP PROV IODINE SCRUB 4OZ (MISCELLANEOUS) ×2 IMPLANT
SPONGE LAP 18X18 RF (DISPOSABLE) ×2 IMPLANT
STOCKINETTE IMPERVIOUS 9X36 MD (GAUZE/BANDAGES/DRESSINGS) IMPLANT
SUT ETHILON 2 0 FS 18 (SUTURE) ×6 IMPLANT
SUT ETHILON 2 0 PSLX (SUTURE) ×4 IMPLANT
TUBE CONNECTING 12X1/4 (SUCTIONS) ×2 IMPLANT
UNDERPAD 30X36 HEAVY ABSORB (UNDERPADS AND DIAPERS) ×2 IMPLANT
YANKAUER SUCT BULB TIP NO VENT (SUCTIONS) ×2 IMPLANT

## 2021-02-06 NOTE — Anesthesia Procedure Notes (Signed)
Procedure Name: Intubation Date/Time: 02/06/2021 9:23 AM Performed by: Eligha Bridegroom, CRNA Pre-anesthesia Checklist: Emergency Drugs available, Patient identified, Suction available, Patient being monitored and Timeout performed Oxygen Delivery Method: Circle system utilized Preoxygenation: Pre-oxygenation with 100% oxygen Induction Type: IV induction Ventilation: Oral airway inserted - appropriate to patient size Laryngoscope Size: Mac and 3 Grade View: Grade II Tube type: Oral Tube size: 7.0 mm Number of attempts: 1 Airway Equipment and Method: Stylet Placement Confirmation: ETT inserted through vocal cords under direct vision,  positive ETCO2 and breath sounds checked- equal and bilateral Secured at: 21 cm Tube secured with: Tape

## 2021-02-06 NOTE — Op Note (Addendum)
Kayla Harrison female 75 y.o. 02/06/2021  PreOperative Diagnosis: Left thigh deep abscess, hematoma Left thigh wound dehiscence, 4 cm  PostOperative Diagnosis: Left thigh deep abscess, hematoma Left thigh wound dehiscence, 4 cm Necrotic skin, subcutaneous tissue, muscle and fascia  PROCEDURE: Incision and drainage of left thigh deep abscess/hematoma Excisional debridement of necrotic skin, subcutaneous tissue, muscle and fascia Complicated revision closure of wound dehiscence  SURGEON: Melony Overly, MD  ASSISTANT: None  ANESTHESIA: General endotracheal tube anesthesia  FINDINGS: Several large areas of loculated hematoma and abscess within the lateral thigh with necrotic skin, subcutaneous tissue, fat, fascia and muscle within these areas.  4 cm area of wound dehiscence.   IMPLANTS: None  INDICATIONS:74 y.o. female had a fall at the end of March 2022 resulting in a large thigh hematoma.  There is skin compromise at the time of her fall and therefore small incision was made to evacuate the hematoma.  She ultimately went on to resolve the hematoma however had subsequent wound dehiscence at the site of the surgery.  There was foul odor and persistent drainage from the wound and concern for deep abscess and necrotic tissue within.  She failed wound care and antibiotic therapy and had worsening symptoms.  She was presented to the emergency department with malaise and some shortness of breath.  Hemoglobin was stable in the emergency department but there is concern for progressive deep infection.  She was admitted to the hospital and was indicated for surgery.   Patient understood the risks, benefits and alternatives to surgery which include but are not limited to wound healing complications, infection, need for further surgery as well as damage to surrounding structures. They also understood the potential for continued pain in that there were no guarantees of acceptable outcome  After weighing these risks the patient opted to proceed with surgery.  PROCEDURE: Patient was identified in the preoperative holding area.  Consent was signed by myself.  Surgical site was marked by myself.  Consent was also signed by the patient after agreeing to the proposed surgical procedure.  She was taken to the operative suite and moved to the operative table.  General endotracheal tube anesthesia was induced without difficulty.  Preoperative antibiotics were held for culture.  The left thigh was elevated and a bump was placed under the left hip.  The left hip was then prepped and draped in the usual sterile fashion.  Surgical timeout was performed.  I began by inspecting the wound.  There was a 4 cm area of wound dehiscence on the lateral thigh.  There is necrotic skin at this area and foul-smelling hematoma oozing from the wound.  On expression of the hip more hematoma and necrotic tissue was exposed.  I then marked out my incision for revision of the wound dehiscence.  This was an elliptical type incision that circumference shortly removed and the area of wound dehiscence.  There is 2 small areas of wound dehiscence  4 cm and 2 cm distal which was a result of skin compromise and eschar and not the original surgical site  A 10 blade was used to ellipse out the prior surgical incision and area of wound.  The skin, subcutaneous tissue and underlying fat was discarded.  Then the wound was inspected.  Through manual palpation it was found that there were several large areas of loculated hematoma and necrotic material.  These were all opened up through blunt dissection and evacuated.  There was severe amount of necrotic fat, fascia  and subcutaneous tissue in these areas.  Deep cavities were created using blunt dissection following the areas of hematoma and abscess.  The deep abscess cavities were opened and evacuated.  Then the area was irrigated copiously with normal saline.  The area was inspected and  it was found that there was need for debridement of these devitalized tissues.  We proceeded with debridement of the all of the cavities of the abscess.  Debridement type: Excisional Debridement  Side: left  Body Location: Thigh/Hip   Tools used for debridement: scalpel, rondure, dissection scissors  Pre-debridement Wound size (cm):   Length: 4cm        Width: 2 cm     depth: 8 cm  Post-debridement Wound size (cm):   Length: 12 cm        width: 4 cm     depth: 12 cm  Debridement depth beyond dead/damaged tissue down to healthy viable tissue: yes  Tissue layer involved: skin, subcutaneous tissue, muscle / fascia  Nature of tissue removed: Necrotic, Devitalized Tissue, Non-viable tissue and Purulence  Irrigation volume: 6000 cc     Irrigation fluid type: Normal Saline  After the wound was adequately debrided the wound was inspected and found to be with underlying healthy appearing tissue.  Again there was a large cavity distal to the wound and posterior as well as anterior to the wound and proximal.  A 19 [place] Blake drain x2 were placed within the deep cavities and exited the skin away from the site of wound dehiscence.  There was one distal and 1 at the level of the deep cavity.  JP bulbs were placed with appropriate suction.  The drains were tied into the skin.  We then proceeded to perform complicated revision closure of the wound dehiscence.  Using a combination of tension type suture technique as well as vertical mattress and simple suture technique the wound was closed in a complicated fashion.  Care was taken to avoid tension and the deep material was well approximated to avoid further cavities.  There was good approximation of the tissues and no further devitalized and necrotic skin at the site of the wound dehiscence after debridement and revision closure.  Then the JP drain bulbs were placed and there was appropriate suction after wound closure.  There was no further leakage  of fluid through the wound.  Soft dressing was placed including Mepilex, ABD pads and Medipore tape.  A compressive Ace wrap was placed around the area.  She was awakened from anesthesia and taken recovery in stable condition.  There were no complications.  Counts were correct at the end the case.  She tolerated the procedure well.  Marland Kitchen  POST OPERATIVE INSTRUCTIONS: Keep compressive wrap and dressing in place. Drain and document output from the drains as needed based on output. Weightbearing as tolerated on the left lower extremity. We will follow-up on culture results for antibiotic therapy adjustments. Will discuss with hospitalist team the possibility of discontinuing the Eliquis and transitioning to a lesser anticoagulant to allow for wound healing.  TOURNIQUET TIME: No tourniquet was used  BLOOD LOSS:  200 mL         DRAINS: 2 - 19 [place] Blake drains with JP bulbs.         SPECIMEN: Tissue was sent for culture       COMPLICATIONS:  * No complications entered in OR log *         Disposition: PACU - hemodynamically stable.  Condition: stable

## 2021-02-06 NOTE — Transfer of Care (Signed)
Immediate Anesthesia Transfer of Care Note  Patient: Kayla Harrison  Procedure(s) Performed: IRRIGATION AND DEBRIDEMENT DEEP ABCESS LEFT THIGH, SECONDARY CLOSURE OF WOUND DEHISCENCE (Left Thigh)  Patient Location: PACU  Anesthesia Type:General  Level of Consciousness: awake, alert  and oriented  Airway & Oxygen Therapy: Patient Spontanous Breathing  Post-op Assessment: Report given to RN and Post -op Vital signs reviewed and stable  Post vital signs: Reviewed and stable  Last Vitals:  Vitals Value Taken Time  BP 142/68 02/06/21 1048  Temp 36.7 C 02/06/21 1048  Pulse 70 02/06/21 1055  Resp 15 02/06/21 1055  SpO2 97 % 02/06/21 1055  Vitals shown include unvalidated device data.  Last Pain:  Vitals:   02/06/21 1048  TempSrc:   PainSc: 8       Patients Stated Pain Goal: 1 (54/62/70 3500)  Complications: No complications documented.

## 2021-02-06 NOTE — Progress Notes (Signed)
Pharmacy Antibiotic Note  Kayla Harrison is a 75 y.o. female admitted on 02/05/2021 with wound infection.  Pharmacy has been consulted for Unasyn dosing. CrCL 60 ml/min  Plan: Unasyn 1.5 gm IV q6hr Monitor renal function, clinical status and C&S  Height: 5\' 6"  (167.6 cm) Weight: 94.5 kg (208 lb 6.4 oz) IBW/kg (Calculated) : 59.3  Temp (24hrs), Avg:98.1 F (36.7 C), Min:97.5 F (36.4 C), Max:98.5 F (36.9 C)  Recent Labs  Lab 02/05/21 1333 02/05/21 1520 02/06/21 0529  WBC 8.0  --  6.5  CREATININE 1.00  --  0.93  LATICACIDVEN  --  1.1  --     Estimated Creatinine Clearance: 59.6 mL/min (by C-G formula based on SCr of 0.93 mg/dL).    Allergies  Allergen Reactions  . Dofetilide Other (See Comments)    Prolonged QT interval   . Ranolazine     Balance issues  . Rivaroxaban Other (See Comments)    Nose Bleed X 6 hrs ; packing in ER Nasal hemorrhage  . Rivaroxaban     Nose Bleed X 6 hrs ; packing in ER Nasal hemorrhage  . Tape Other (See Comments)    SKIN IS THIN AND TEARS EASILY   . Tikosyn [Dofetilide] Other (See Comments)    "Long QT wave"   . Epinephrine Other (See Comments)    Oral anesthetic Dental form only (liquid). Patient stated she will become "out of it" she can hear you but cannot respond in normal fashion. "not fully with it"  . Epinephrine   . Ketamine Other (See Comments)    Hallucinations   . Adhesive [Tape] Rash    Paper tape as well  . Ketamine Other (See Comments)    HALLUCINATIONS    Antimicrobials this admission: Unasyn 5/17 >>  Thank you for allowing pharmacy to be a part of this patient's care.  Alanda Slim, PharmD, [facility] Clinical Pharmacist Please see AMION for all Pharmacists' Contact Phone Numbers 02/06/2021, 3:39 PM

## 2021-02-06 NOTE — Progress Notes (Signed)
PROGRESS NOTE    Kayla Harrison  CNO:[id number] DOB: 1946-08-19 DOA: 02/05/2021 PCP: [name], MD   Brief Narrative: Kayla Harrison is a 75 y.o. female with a history of hypothyroidism, non-Hodgkin lymphoma with SVC syndrome, atrial fibrillation on Eliquis, status post pacemaker, obesity status post lap band surgery, hypertension, hyperlipidemia.  Patient presented secondary to recurrent wound hematoma/infection related to prior I&D for hematoma.  Orthopedic surgery consulted on admission and recommending I&D in OR.   Assessment & Plan:   Principal Problem:   Infection of wound hematoma Active Problems:   HYPERLIPIDEMIA   Class 1 obesity due to excess calories with body mass index (BMI) of 33.0 to 33.9 in adult   Hypothyroidism   A-fib (HCC)   Pacemaker   Orthostatic hypotension   Infection of wound hematoma Wound secondary to previous I&D for prior hematoma. Currently with foul smell. Blood clots in wound. -Orthopedic surgery recommendations: I&D today -Unasyn post-op and await culture data  Atrial fibrillation, unspecified Rate controlled. On Eliquis as an outpatient which is held secondary to above.  Orthostatic hypotension Chronic. Patient is on midodrine as an outpatient. -Continue midodrine  Hyperlipidemia -Continue Crestor  Hypothyroidism -Continue Synthroid  Obesity Body mass index is 33.64 kg/m.   History of lymphoma Non-hodgkin lymphoma. Follows with Dr. Lindi Adie. Currently in remission. remission.   DVT prophylaxis: SCDs Code Status:   Code Status: Full Code Family Communication: None at bedside Disposition Plan: Discharge home vs SNF pending orthopedic surgery recommendations, culture data, transition to outpatient regimen for antibiotics   Consultants:   Orthopedic surgery  Procedures:   None  Antimicrobials:  None    Subjective: Very foul smell from wound. No other issues.  Objective: Vitals:   02/05/21 1815  02/05/21 2205 02/06/21 0424 02/06/21 0826  BP: (!) 143/50 (!) 108/45 133/62   Pulse: 68 64 67   Resp: 18 15 17    Temp: 97.6 F (36.4 C) 98.3 F (36.8 C) 98.2 F (36.8 C)   TempSrc: Oral Oral Oral   SpO2: 98% 96% 97%   Weight:    94.5 kg  Height:    5\' 6"  (1.676 m)    Intake/Output Summary (Last 24 hours) at 02/06/2021 1016 Last data filed at 02/06/2021 0517 Gross per 24 hour  Intake 300.32 ml  Output --  Net 300.32 ml   Filed Weights   02/06/21 0826  Weight: 94.5 kg    Examination:  General exam: Appears calm and comfortable Respiratory system: Clear to auscultation. Respiratory effort normal. Cardiovascular system: S1 & S2 heard, RRR. No murmurs, rubs, gallops or clicks. Gastrointestinal system: Abdomen is nondistended, soft and nontender. No organomegaly or masses felt. Normal bowel sounds heard. Central nervous system: Alert and oriented. No focal neurological deficits. Musculoskeletal: No edema. No calf tenderness Skin: No cyanosis. Left thigh wound with large blood clot noted. Foul smell eminating from wound. Surrounding erythema. Wound edges appear epibole Psychiatry: Judgement and insight appear normal. Mood & affect appropriate.     Data Reviewed: I have personally reviewed following labs and imaging studies  CBC Lab Results  Component Value Date   WBC 6.5 02/06/2021   RBC 3.14 (L) 02/06/2021   HGB 9.7 (L) 02/06/2021   HCT 30.9 (L) 02/06/2021   MCV 98.4 02/06/2021   MCH 30.9 02/06/2021   PLT 221 02/06/2021   MCHC 31.4 02/06/2021   RDW 16.8 (H) 02/06/2021   LYMPHSABS 0.7 02/05/2021   MONOABS 1.2 (H) 02/05/2021   EOSABS 0.1 02/05/2021  BASOSABS 0.1 40/98/1191     Last metabolic panel Lab Results  Component Value Date   NA 136 02/06/2021   K 4.4 02/06/2021   CL 106 02/06/2021   CO2 25 02/06/2021   BUN 16 02/06/2021   CREATININE 0.93 02/06/2021   GLUCOSE 89 02/06/2021   GFRNONAA >60 02/06/2021   GFRAA 53 (L) 08/16/2020   CALCIUM 8.1 (L)  02/06/2021   PHOS 3.0 12/21/2020   PROT 6.3 (L) 02/05/2021   ALBUMIN 3.2 (L) 02/05/2021   LABGLOB 2.4 08/16/2020   AGRATIO 1.7 08/16/2020   BILITOT 0.8 02/05/2021   ALKPHOS 77 02/05/2021   AST 23 02/05/2021   ALT 21 02/05/2021   ANIONGAP 5 02/06/2021    CBG (last 3)  No results for input(s): GLUCAP in the last 72 hours.   GFR: Estimated Creatinine Clearance: 59.6 mL/min (by C-G formula based on SCr of 0.93 mg/dL).  Coagulation Profile: Recent Labs  Lab 02/05/21 1333  INR 1.6*    Recent Results (from the past 240 hour(s))  Blood culture (routine x 2)     Status: None (Preliminary result)   Collection Time: 02/05/21  2:58 PM   Specimen: BLOOD LEFT ARM  Result Value Ref Range Status   Specimen Description BLOOD LEFT ARM  Final   Special Requests   Final    BOTTLES DRAWN AEROBIC AND ANAEROBIC Blood Culture adequate volume   Culture   Final    NO GROWTH < 24 HOURS Performed at [facility], [place] [address], [place], [place] [address]    Report Status PENDING  Incomplete  Aerobic Culture w Gram Stain (superficial specimen)     Status: None (Preliminary result)   Collection Time: 02/05/21  3:38 PM   Specimen: Wound  Result Value Ref Range Status   Specimen Description WOUND  Final   Special Requests NONE  Final   Gram Stain   Final    ABUNDANT WBC PRESENT, PREDOMINANTLY PMN ABUNDANT GRAM NEGATIVE RODS MODERATE GRAM POSITIVE COCCI    Culture   Final    MODERATE GRAM NEGATIVE RODS CULTURE REINCUBATED FOR BETTER GROWTH SUSCEPTIBILITIES TO FOLLOW Performed at [facility], [place] [address], [place], [place] [address]    Report Status PENDING  Incomplete  Resp Panel by RT-PCR (Flu A&B, Covid) Nasopharyngeal Swab     Status: None   Collection Time: 02/05/21  4:00 PM   Specimen: Nasopharyngeal Swab; Nasopharyngeal(NP) swabs in vial transport medium  Result Value Ref Range Status   SARS Coronavirus 2 by RT PCR NEGATIVE NEGATIVE Final    Comment:  (NOTE) SARS-CoV-2 target nucleic acids are NOT DETECTED.  The SARS-CoV-2 RNA is generally detectable in upper respiratory specimens during the acute phase of infection. The lowest concentration of SARS-CoV-2 viral copies this assay can detect is 138 copies/mL. A negative result does not preclude SARS-Cov-2 infection and should not be used as the sole basis for treatment or other patient management decisions. A negative result may occur with  improper specimen collection/handling, submission of specimen other than nasopharyngeal swab, presence of viral mutation(s) within the areas targeted by this assay, and inadequate number of viral copies(<138 copies/mL). A negative result must be combined with clinical observations, patient history, and epidemiological information. The expected result is Negative.  Fact Sheet for Patients:  EntrepreneurPulse.com.au  Fact Sheet for Healthcare Providers:  IncredibleEmployment.be  This test is no t yet approved or cleared by the [place] FDA and  has been authorized for detection and/or  diagnosis of SARS-CoV-2 by FDA under an Emergency Use Authorization (EUA). This EUA will remain  in effect (meaning this test can be used) for the duration of the COVID-19 declaration under Section 564(b)(1) of the Act, 21 U.S.C.section 360bbb-3(b)(1), unless the authorization is terminated  or revoked sooner.       Influenza A by PCR NEGATIVE NEGATIVE Final   Influenza B by PCR NEGATIVE NEGATIVE Final    Comment: (NOTE) The Xpert Xpress SARS-CoV-2/FLU/RSV plus assay is intended as an aid in the diagnosis of influenza from Nasopharyngeal swab specimens and should not be used as a sole basis for treatment. Nasal washings and aspirates are unacceptable for Xpert Xpress SARS-CoV-2/FLU/RSV testing.  Fact Sheet for Patients: EntrepreneurPulse.com.au  Fact Sheet for Healthcare  Providers: IncredibleEmployment.be  This test is not yet approved or cleared by the [place] FDA and has been authorized for detection and/or diagnosis of SARS-CoV-2 by FDA under an Emergency Use Authorization (EUA). This EUA will remain in effect (meaning this test can be used) for the duration of the COVID-19 declaration under Section 564(b)(1) of the Act, 21 U.S.C. section 360bbb-3(b)(1), unless the authorization is terminated or revoked.  Performed at [facility], [place] [address] [place], [place] [address]   Surgical pcr screen     Status: None   Collection Time: 02/06/21  1:51 AM   Specimen: Nasal Mucosa; Nasal Swab  Result Value Ref Range Status   MRSA, PCR NEGATIVE NEGATIVE Final   Staphylococcus aureus NEGATIVE NEGATIVE Final    Comment: (NOTE) The Xpert SA Assay (FDA approved for NASAL specimens in patients 63 years of age and older), is one component of a comprehensive surveillance program. It is not intended to diagnose infection nor to guide or monitor treatment. Performed at [facility], [place] [address], [place], [place] [address]         Radiology Studies: No results found.      Scheduled Meds: . [MAR Hold] amiodarone  200 mg Oral Daily  . [MAR Hold] docusate sodium  100 mg Oral BID  . [MAR Hold] fludrocortisone  0.05 mg Oral BID WC  . [MAR Hold] levothyroxine  137 mcg Oral Once per day on Sun Mon Tue Wed Thu Fri  . [MAR Hold] levothyroxine  68.5 mcg Oral Once per day on Sat  . [MAR Hold] Lifitegrast  1 drop Both Eyes QHS  . [MAR Hold] midodrine  5 mg Oral BID WC  . [MAR Hold] rosuvastatin  10 mg Oral Once per day on Mon Thu   Continuous Infusions: . lactated ringers Stopped (02/06/21 0146)  . lactated ringers 10 mL/hr at 02/06/21 0850     LOS: 1 day     Cordelia Poche, MD [facility] 02/06/2021, 10:16 AM  If 7PM-7AM, please contact night-coverage [email or web address]

## 2021-02-06 NOTE — Consult Note (Signed)
Reason for Consult: Left thigh surgical wound dehiscence with deep abscess and surrounding infection Referring Physician: [facility]  Kayla Harrison is an 75 y.o. female.  HPI: Patient presented to the emergency department yesterday at my request given phone call with home health nursing who was concerned for progression of infection and persistent wound dehiscence about her left proximal thigh wound.  She underwent I&D and evacuation of hematoma on 12/19/2020 by myself.  She had subsequent wound dehiscence and persistent drainage.  She was seen in the office late last week where she was complaining of fatigue and malaise.  Hemoglobin level was drawn due to anticoagulation with Eliquis for prior stroke.  Hemoglobin came back in the high 10 range.  She did have a slightly elevated white blood cell count.  She was placed on Bactrim by her wound care specialist.  Due to worsening symptoms she was instructed to present to the emergency department.  In the emergency department it was thought by the hospitalist team that her malaise and fatigue was due to infection at the wound dehiscence site.  Upon my evaluation there was foul odor at the wound site and persistent drainage.  The drainage was bloody and serosanguineous.  No expressible purulence.  Patient denied pain in this area but did note the need for frequent dressing changes due to the drainage.  Past Medical History:  Diagnosis Date  . Anemia   . Arthritis    osteoarthritis - knees and right shoulder  . Blood transfusion without reported diagnosis   . Breast cancer ([place])    Dr Margot Chimes, total thyroidectomy- 1999- for cancer  . 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 Chronic bilateral pleural effusions   . Colon polyp    Dr Earlean Shawl  . Complication of anesthesia    Ketamine produces LSD reaction, bright colored nightmarish experience   . Dyslipidemia   . Endometriosis   . Fibroids   . H/O pleural effusion    s/p thoracentesis w 3219ml  withdrawn  . Hepatitis    [place] ?hepatitis   . History of dysphagia    due to radiation therapy  . History of hiatal hernia    small noted on PET scan  . Hypertension   . Hypothyroidism   . Lung cancer, lower lobe ([place]) 01/2017   radiation RX completed 03/04/17; will start chemo 6/27, pt unaware of lung cancer  . Morbid obesity ([place])    Status post lap band surgery  . Nephrolithiasis   . Non Hodgkin's lymphoma ([place])    on chemotherapy  . Persistent atrial fibrillation ([place])    a. s/p PVI 2008 b. s/p convergent ablation 2637 complicated by bradycardia requiring pacemaker implant  . Personal history of radiation therapy   . Presence of permanent cardiac pacemaker   . Rotator cuff tear    Right  . Stroke [facility])    2003- [place] x2  . SVC syndrome    with lung mass and non hodgkins lymphoma  . Thyroid cancer ([place]) 2000    Past Surgical History:  Procedure Laterality Date  . ABDOMINAL HYSTERECTOMY  1983  . afib ablation     a. 2008 PVI b. 2014 convergent ablation  . APPENDECTOMY    . BONE MARROW BIOPSY  02/21/2017  . BREAST LUMPECTOMY Left 2010  . bso  1998  . CARDIAC CATHETERIZATION     2015- negative  . CARDIOVERSION  10/09/2012   Procedure: CARDIOVERSION;  Surgeon: Minus Breeding,  MD;  Location: [place];  Service: Cardiovascular;  Laterality: N/A;  . CARDIOVERSION  10/09/2012   Procedure: CARDIOVERSION;  Surgeon: Minus Breeding, MD;  Location: [facility] ENDOSCOPY;  Service: Cardiovascular;  Laterality: N/A;  [name] gave the ok to add pt to the add on , but we must check to find out if the can add pt on at [phone] ( [phone])  . CARDIOVERSION N/A 11/20/2012   Procedure: CARDIOVERSION;  Surgeon: Fay Records, MD;  Location: [place];  Service: Cardiovascular;  Laterality: N/A;  . CARDIOVERSION N/A 07/18/2017   Procedure: CARDIOVERSION;  Surgeon: Thayer Headings, MD;  Location: [facility] ENDOSCOPY;  Service: Cardiovascular;  Laterality: N/A;  . CARDIOVERSION  N/A 10/03/2017   Procedure: CARDIOVERSION;  Surgeon: Sanda Klein, MD;  Location: [facility] ENDOSCOPY;  Service: Cardiovascular;  Laterality: N/A;  . CARDIOVERSION N/A 01/07/2018   Procedure: CARDIOVERSION;  Surgeon: Thayer Headings, MD;  Location: [facility] ENDOSCOPY;  Service: Cardiovascular;  Laterality: N/A;  . CARDIOVERSION N/A 12/10/2019   Procedure: CARDIOVERSION;  Surgeon: Buford Dresser, MD;  Location: [facility] ENDOSCOPY;  Service: Cardiovascular;  Laterality: N/A;  . CHOLECYSTECTOMY    . COLONOSCOPY W/ POLYPECTOMY     Dr Earlean Shawl  . CYSTOSCOPY N/A 02/06/2015   Procedure: CYSTOSCOPY;  Surgeon: Kathie Rhodes, MD;  Location: [facility] ORS;  Service: Urology;  Laterality: N/A;  . CYSTOSCOPY W/ RETROGRADES Left 11/17/2017   Procedure: CYSTOSCOPY WITH RETROGRADE /PYELOGRAM/;  Surgeon: Kathie Rhodes, MD;  Location: [facility];  Service: Urology;  Laterality: Left;  . CYSTOSCOPY WITH RETROGRADE PYELOGRAM, URETEROSCOPY AND STENT PLACEMENT Right 02/06/2015   Procedure: RETROGRADE PYELOGRAM, RIGHT URETEROSCOPY STENT PLACEMENT;  Surgeon: Kathie Rhodes, MD;  [facility]: [facility];  Service: Urology;  Laterality: Right;  . CYSTOSCOPY WITH RETROGRADE PYELOGRAM, URETEROSCOPY AND STENT PLACEMENT Right 03/07/2017   Procedure: CYSTOSCOPY WITH RIGHT RETROGRADE PYELOGRAM,RIGHT  URETEROSCOPYLASER LITHOTRIPSY  AND STENT PLACEMENT AND STONE BASKETRY;  Surgeon: Kathie Rhodes, MD;  Location: [place];  Service: Urology;  Laterality: Right;  . EYE SURGERY     cataract surgery  . fatty mass removal  1999   pubic area  . HOLMIUM LASER APPLICATION N/A 1/63/8453   Procedure: HOLMIUM LASER APPLICATION;  Surgeon: Kathie Rhodes, MD;  [facility];  Service: Urology;  Laterality: N/A;  . HOLMIUM LASER APPLICATION Right 6/46/8032   Procedure: HOLMIUM LASER APPLICATION;  Surgeon: Kathie Rhodes, MD;  Location: [facility];  Service: Urology;  Laterality: Right;  . HOLMIUM LASER APPLICATION Left 10/15/4823   Procedure:  HOLMIUM LASER APPLICATION;  Surgeon: Kathie Rhodes, MD;  [facility];  Service: Urology;  Laterality: Left;  . I & D EXTREMITY Left 12/19/2020   Procedure: IRRIGATION AND DEBRIDEMENT OF LEFT HIP HEMATOMA WITH APPLICATION OF WOUND VAC;  Surgeon: Erle Crocker, MD;  Location: [place];  Service: Orthopedics;  Laterality: Left;  . IR FLUORO GUIDE PORT INSERTION RIGHT  02/24/2017  . IR NEPHROSTOMY PLACEMENT RIGHT  11/17/2017  . IR PATIENT EVAL TECH 0-60 MINS  03/11/2017  . IR REMOVAL TUN ACCESS W/ PORT W/O FL MOD SED  04/20/2018  . IR US GUIDE VASC ACCESS RIGHT  02/24/2017  . KNEE ARTHROSCOPY     bilateral  . LAPAROSCOPIC GASTRIC BANDING  07/10/2010  . LAPAROSCOPIC GASTRIC BANDING     Laparoscopic adjustable banding APS System with posterior hiatal hernia, 2 suture.  Marland Kitchen LAPAROTOMY     for ruptured ovary and ovarian artery   . NEPHROLITHOTOMY Right 11/17/2017   Procedure: NEPHROLITHOTOMY PERCUTANEOUS;  Surgeon: Kathie Rhodes, MD;  [facility];  Service: Urology;  Laterality: Right;  . PACEMAKER INSERTION  03/10/2013   MDT dual chamber PPM  . POCKET REVISION N/A 12/08/2013   Procedure: POCKET REVISION;  Surgeon: Deboraha Sprang, MD;  Location: [facility] CATH LAB;  Service: Cardiovascular;  Laterality: N/A;  . PORTA CATH INSERTION    . REVERSE SHOULDER ARTHROPLASTY Right 05/14/2018   Procedure: RIGHT REVERSE SHOULDER ARTHROPLASTY;  Surgeon: Tania Ade, MD;  Location: [place];  Service: Orthopedics;  Laterality: Right;  . REVERSE SHOULDER REPLACEMENT Right 05/14/2018  . RIGHT HEART CATH N/A 07/21/2019   Procedure: RIGHT HEART CATH;  Surgeon: Larey Dresser, MD;  Location: [place] CV LAB;  Service: Cardiovascular;  Laterality: N/A;  . TEE WITH CARDIOVERSION  09/22/2017  . TEE WITHOUT CARDIOVERSION N/A 10/03/2017   Procedure: TRANSESOPHAGEAL ECHOCARDIOGRAM (TEE);  Surgeon: Sanda Klein, MD;  Location: [facility] ENDOSCOPY;  Service: Cardiovascular;  Laterality: N/A;  . TEE WITHOUT CARDIOVERSION N/A  08/04/2019   Procedure: TRANSESOPHAGEAL ECHOCARDIOGRAM (TEE);  Surgeon: Larey Dresser, MD;  Location: [facility] ENDOSCOPY;  Service: Cardiovascular;  Laterality: N/A;  . TEE WITHOUT CARDIOVERSION N/A 12/10/2019   Procedure: TRANSESOPHAGEAL ECHOCARDIOGRAM (TEE);  Surgeon: Buford Dresser, MD;  Location: [facility] ENDOSCOPY;  Service: Cardiovascular;  Laterality: N/A;  . THYROIDECTOMY  1998   Dr Margot Chimes  . TONSILLECTOMY    . TOTAL KNEE ARTHROPLASTY  04/13/2012   Procedure: TOTAL KNEE ARTHROPLASTY;  Surgeon: Rudean Haskell, MD;  Location: [place];  Service: Orthopedics;  Laterality: Right;  Marland Kitchen VIDEO BRONCHOSCOPY WITH ENDOBRONCHIAL ULTRASOUND N/A 02/07/2017   Procedure: VIDEO BRONCHOSCOPY WITH ENDOBRONCHIAL ULTRASOUND;  Surgeon: Marshell Garfinkel, MD;  Location: [place];  Service: Pulmonary;  Laterality: N/A;    Family History  Problem Relation Age of Onset  . Heart disease Father 64       MI $R'@autopsy'KA$   . Colon cancer Father        COLON  . Heart attack Father   . Other Mother        temporal arteritis   . Diabetes Sister   . Diabetes Brother   . Diabetes Paternal Aunt   . Diabetes Paternal Grandmother   . Esophageal cancer Neg Hx   . Inflammatory bowel disease Neg Hx   . Liver disease Neg Hx   . Pancreatic cancer Neg Hx   . Stomach cancer Neg Hx     Social History:  reports that she has never smoked. She has never used smokeless tobacco. She reports that she does not drink alcohol and does not use drugs.  Allergies:  Allergies  Allergen Reactions  . Dofetilide Other (See Comments)    Prolonged QT interval   . Ranolazine     Balance issues  . Rivaroxaban Other (See Comments)    Nose Bleed X 6 hrs ; packing in ER Nasal hemorrhage  . Rivaroxaban     Nose Bleed X 6 hrs ; packing in ER Nasal hemorrhage  . Tape Other (See Comments)    SKIN IS THIN AND TEARS EASILY   . Tikosyn [Dofetilide] Other (See Comments)    "Long QT wave"   . Epinephrine Other (See Comments)    Oral  anesthetic Dental form only (liquid). Patient stated she will become "out of it" she can hear you but cannot respond in normal fashion. "not fully with it"  . Epinephrine   . Ketamine Other (See Comments)    Hallucinations   . Adhesive [Tape] Rash  Paper tape as well  . Ketamine Other (See Comments)    HALLUCINATIONS    Medications: I have reviewed the patient's current medications.  Results for orders placed or performed during the hospital encounter of 02/05/21 (from the past 48 hour(s))  CBC with Differential     Status: Abnormal   Collection Time: 02/05/21  1:33 PM  Result Value Ref Range   WBC 8.0 4.0 - 10.5 K/uL   RBC 3.71 (L) 3.87 - 5.11 MIL/uL   Hemoglobin 11.2 (L) 12.0 - 15.0 g/dL   HCT 37.6 36.0 - 46.0 %   MCV 101.3 (H) 80.0 - 100.0 fL   MCH 30.2 26.0 - 34.0 pg   MCHC 29.8 (L) 30.0 - 36.0 g/dL   RDW 16.8 (H) 11.5 - 15.5 %   Platelets 253 150 - 400 K/uL   nRBC 0.0 0.0 - 0.2 %   Neutrophils Relative % 75 %   Neutro Abs 6.0 1.7 - 7.7 K/uL   Lymphocytes Relative 8 %   Lymphs Abs 0.7 0.7 - 4.0 K/uL   Monocytes Relative 15 %   Monocytes Absolute 1.2 (H) 0.1 - 1.0 K/uL   Eosinophils Relative 1 %   Eosinophils Absolute 0.1 0.0 - 0.5 K/uL   Basophils Relative 1 %   Basophils Absolute 0.1 0.0 - 0.1 K/uL   Immature Granulocytes 0 %   Abs Immature Granulocytes 0.02 0.00 - 0.07 K/uL    Comment: Performed at [facility], [address] [place], [place] [address]  Comprehensive metabolic panel     Status: Abnormal   Collection Time: 02/05/21  1:33 PM  Result Value Ref Range   Sodium 137 135 - 145 mmol/L   Potassium 4.1 3.5 - 5.1 mmol/L   Chloride 105 98 - 111 mmol/L   CO2 23 22 - 32 mmol/L   Glucose, Bld 103 (H) 70 - 99 mg/dL    Comment: Glucose reference range applies only to samples taken after fasting for at least 8 hours.   BUN 17 8 - 23 mg/dL   Creatinine, Ser 1.00 0.44 - 1.00 mg/dL   Calcium 8.9 8.9 - 10.3 mg/dL   Total Protein 6.3 (L) 6.5 - 8.1 g/dL    Albumin 3.2 (L) 3.5 - 5.0 g/dL   AST 23 15 - 41 U/L   ALT 21 0 - 44 U/L   Alkaline Phosphatase 77 38 - 126 U/L   Total Bilirubin 0.8 0.3 - 1.2 mg/dL   GFR, Estimated 58 (L) >60 mL/min    Comment: (NOTE) Calculated using the CKD-EPI Creatinine Equation (2021)    Anion gap 9 5 - 15    Comment: Performed at [facility], [place] [address] [place], [place] [address]  Protime-INR     Status: Abnormal   Collection Time: 02/05/21  1:33 PM  Result Value Ref Range   Prothrombin Time 18.9 (H) 11.4 - 15.2 seconds   INR 1.6 (H) 0.8 - 1.2    Comment: (NOTE) INR goal varies based on device and disease states. Performed at [facility], [place] [address] [place], [place] [address]   Blood culture (routine x 2)     Status: None (Preliminary result)   Collection Time: 02/05/21  2:58 PM   Specimen: BLOOD LEFT ARM  Result Value Ref Range   Specimen Description BLOOD LEFT ARM    Special Requests      BOTTLES DRAWN AEROBIC AND ANAEROBIC Blood Culture adequate volume   Culture  NO GROWTH < 24 HOURS Performed at [facility] [address], [place], [place] [address]    Report Status PENDING   Lactic acid, plasma     Status: None   Collection Time: 02/05/21  3:20 PM  Result Value Ref Range   Lactic Acid, Venous 1.1 0.5 - 1.9 mmol/L    Comment: Performed at [facility], [place] [address], [place], [place] [address]  Resp Panel by RT-PCR (Flu A&B, Covid) Nasopharyngeal Swab     Status: None   Collection Time: 02/05/21  4:00 PM   Specimen: Nasopharyngeal Swab; Nasopharyngeal(NP) swabs in vial transport medium  Result Value Ref Range   SARS Coronavirus 2 by RT PCR NEGATIVE NEGATIVE    Comment: (NOTE) SARS-CoV-2 target nucleic acids are NOT DETECTED.  The SARS-CoV-2 RNA is generally detectable in upper respiratory specimens during the acute phase of infection. The lowest concentration of SARS-CoV-2 viral copies this assay can detect is 138 copies/mL. A negative result  does not preclude SARS-Cov-2 infection and should not be used as the sole basis for treatment or other patient management decisions. A negative result may occur with  improper specimen collection/handling, submission of specimen other than nasopharyngeal swab, presence of viral mutation(s) within the areas targeted by this assay, and inadequate number of viral copies(<138 copies/mL). A negative result must be combined with clinical observations, patient history, and epidemiological information. The expected result is Negative.  Fact Sheet for Patients:  EntrepreneurPulse.com.au  Fact Sheet for Healthcare Providers:  IncredibleEmployment.be  This test is no t yet approved or cleared by the [place] FDA and  has been authorized for detection and/or diagnosis of SARS-CoV-2 by FDA under an Emergency Use Authorization (EUA). This EUA will remain  in effect (meaning this test can be used) for the duration of the COVID-19 declaration under Section 564(b)(1) of the Act, 21 U.S.C.section 360bbb-3(b)(1), unless the authorization is terminated  or revoked sooner.       Influenza A by PCR NEGATIVE NEGATIVE   Influenza B by PCR NEGATIVE NEGATIVE    Comment: (NOTE) The Xpert Xpress SARS-CoV-2/FLU/RSV plus assay is intended as an aid in the diagnosis of influenza from Nasopharyngeal swab specimens and should not be used as a sole basis for treatment. Nasal washings and aspirates are unacceptable for Xpert Xpress SARS-CoV-2/FLU/RSV testing.  Fact Sheet for Patients: EntrepreneurPulse.com.au  Fact Sheet for Healthcare Providers: IncredibleEmployment.be  This test is not yet approved or cleared by the [place] FDA and has been authorized for detection and/or diagnosis of SARS-CoV-2 by FDA under an Emergency Use Authorization (EUA). This EUA will remain in effect (meaning this test can be used) for the duration  of the COVID-19 declaration under Section 564(b)(1) of the Act, 21 U.S.C. section 360bbb-3(b)(1), unless the authorization is terminated or revoked.  Performed at [facility], [place] [address], [place], [place] [address]   Surgical pcr screen     Status: None   Collection Time: 02/06/21  1:51 AM   Specimen: Nasal Mucosa; Nasal Swab  Result Value Ref Range   MRSA, PCR NEGATIVE NEGATIVE   Staphylococcus aureus NEGATIVE NEGATIVE    Comment: (NOTE) The Xpert SA Assay (FDA approved for NASAL specimens in patients 44 years of age and older), is one component of a comprehensive surveillance program. It is not intended to diagnose infection nor to guide or monitor treatment. Performed at [facility], [place] [address] [place],  [address]   Basic metabolic  panel     Status: Abnormal   Collection Time: 02/06/21  5:29 AM  Result Value Ref Range   Sodium 136 135 - 145 mmol/L   Potassium 4.4 3.5 - 5.1 mmol/L   Chloride 106 98 - 111 mmol/L   CO2 25 22 - 32 mmol/L   Glucose, Bld 89 70 - 99 mg/dL    Comment: Glucose reference range applies only to samples taken after fasting for at least 8 hours.   BUN 16 8 - 23 mg/dL   Creatinine, Ser 0.93 0.44 - 1.00 mg/dL   Calcium 8.1 (L) 8.9 - 10.3 mg/dL   GFR, Estimated >60 >60 mL/min    Comment: (NOTE) Calculated using the CKD-EPI Creatinine Equation (2021)    Anion gap 5 5 - 15    Comment: Performed at [facility] [address] [place], [place] [address]  CBC     Status: Abnormal   Collection Time: 02/06/21  5:29 AM  Result Value Ref Range   WBC 6.5 4.0 - 10.5 K/uL   RBC 3.14 (L) 3.87 - 5.11 MIL/uL   Hemoglobin 9.7 (L) 12.0 - 15.0 g/dL   HCT 30.9 (L) 36.0 - 46.0 %   MCV 98.4 80.0 - 100.0 fL   MCH 30.9 26.0 - 34.0 pg   MCHC 31.4 30.0 - 36.0 g/dL   RDW 16.8 (H) 11.5 - 15.5 %   Platelets 221 150 - 400 K/uL   nRBC 0.0 0.0 - 0.2 %    Comment: Performed at [facility], [place] [address], [place], [place] [address]    *Note: Due to a large number of results and/or encounters for the requested time period, some results have not been displayed. A complete set of results can be found in Results Review.    No results found.  Review of Systems  Constitutional: Positive for fatigue.  HENT: Negative.   Eyes: Negative.   Respiratory: Negative.   Gastrointestinal: Negative.   Endocrine: Negative.   Musculoskeletal:       Left lateral thigh and hip wound with redness  Skin: Positive for wound.  Neurological: Negative.   Hematological: Bruises/bleeds easily.  Psychiatric/Behavioral: Negative.    Blood pressure 133/62, pulse 67, temperature 98.2 F (36.8 C), temperature source Oral, resp. rate 17, height $RemoveBe'5\' 6"'UubKwpRHN$  (1.676 m), weight 94.5 kg, SpO2 97 %. Physical Exam HENT:     Head: Atraumatic.     Mouth/Throat:     Mouth: Mucous membranes are moist.  Eyes:     Extraocular Movements: Extraocular movements intact.  Cardiovascular:     Rate and Rhythm: Normal rate.     Pulses: Normal pulses.  Pulmonary:     Effort: Pulmonary effort is normal.  Abdominal:     Palpations: Abdomen is soft.  Musculoskeletal:     Cervical back: Neck supple.     Comments: Left upper thigh demonstrates 4 to 5 cm wound dehiscence from prior surgical wound.  There is surrounding erythema and foul odor with drainage.  Mild tenderness to palpation around the wound.  No deep fluctuance noted.  No evidence of wound or erythema distally or proximally from the site of wound dehiscence.  Skin:    General: Skin is warm.     Findings: Erythema present.  Neurological:     General: No focal deficit present.     Mental Status: She is alert.  Psychiatric:        Mood and Affect: Mood normal.  Assessment/Plan: Patient has had dehiscence of her left thigh surgical wound.  There is surrounding erythema, foul odor and drainage concerning for deep infection.  We will plan for incision and drainage of deep thigh abscess with deep culture  and debridement of necrotic tissue.  We will also plan for secondary closure of her surgical wound dehiscence.  She will likely need drain placements as she has continued drainage from this area and is anticoagulated.  I had a conversation with the hospitalist team who saw her in the emergency department and it is thought that we may need to stop her Eliquis or lower the dose or consider transition to low-dose aspirin to reduce the risk of persistent bleeding after the surgery to allow the wound to heal.  She takes Eliquis due to prior stroke.  Currently she is not septic.  Her hemoglobin level was 11.2 in the emergency department.  Her white blood cell count was 8.0.  We did discuss the risks, benefits and alternatives to the surgery which include but are not limited to continued infection, wound healing problems, need for further surgery, continued pain, damage to surrounding structures.  After weighing these risks she would like to proceed.  Erle Crocker 02/06/2021, 8:36 AM

## 2021-02-07 ENCOUNTER — Encounter (HOSPITAL_BASED_OUTPATIENT_CLINIC_OR_DEPARTMENT_OTHER): Payer: Medicare Other | Admitting: Internal Medicine

## 2021-02-07 ENCOUNTER — Encounter (HOSPITAL_COMMUNITY): Payer: Self-pay | Admitting: Orthopaedic Surgery

## 2021-02-07 DIAGNOSIS — T148XXA Other injury of unspecified body region, initial encounter: Secondary | ICD-10-CM | POA: Diagnosis not present

## 2021-02-07 DIAGNOSIS — L089 Local infection of the skin and subcutaneous tissue, unspecified: Secondary | ICD-10-CM | POA: Diagnosis not present

## 2021-02-07 MED ORDER — CEFAZOLIN SODIUM-DEXTROSE 2-4 GM/100ML-% IV SOLN
2.0000 g | Freq: Three times a day (TID) | INTRAVENOUS | Status: DC
  Administered 2021-02-07 – 2021-02-08 (×3): 2 g via INTRAVENOUS
  Filled 2021-02-07 (×4): qty 100

## 2021-02-07 MED ORDER — METRONIDAZOLE 500 MG/100ML IV SOLN
500.0000 mg | Freq: Two times a day (BID) | INTRAVENOUS | Status: DC
  Administered 2021-02-07 – 2021-02-08 (×2): 500 mg via INTRAVENOUS
  Filled 2021-02-07 (×2): qty 100

## 2021-02-07 NOTE — Progress Notes (Addendum)
Pt has not voided, pt stated has no urge to pee. Bladder scan volume--281ml. Notified on call  doctor Argie Ramming via text page received new order to do in and out cath.@[id number]  In and out cath done with approx 252ml urine output.

## 2021-02-07 NOTE — Progress Notes (Signed)
     Kayla Harrison is a 75 y.o. female   Orthopaedic diagnosis:left thigh deep abscess and hematoma with wound dehiscence status post revision wound closure, incision and drainage of deep abscess and debridement of wound.  Subjective: Overall she is doing well.  She has noted some episodes since the anesthesia of forgetfulness and difficulty with word finding.  She denies significant pain in her thigh.  The drains have been putting out a substantial amount of fluid.  She is tolerating the Ace wrap.  Objectyive: Vitals:   02/07/21 0453 02/07/21 0859  BP: (!) 101/52 (!) 106/50  Pulse: 69 70  Resp: 20 20  Temp: (!) 97.5 F (36.4 C) 98.1 F (36.7 C)  SpO2: 96% 95%     Exam: Awake and alert Respirations even and unlabored No acute distress  Left thigh with compressive Ace wrap. No strikethrough or shadowing of the dressing.  Drains in place that seem to be draining well.  There is some serosanguineous drainage within the drain bulbs.  2 drains present.  There is some swelling distal to the Ace wrap which is expected.  No significant tenderness to palpation along the lateral thigh.  Microbiology: Gram-positive cocci and gram-negative rods  Assessment: Postop day 1 status post revision wound closure, I&D and debridement of deep abscess and soft tissue left thigh.   Plan: We will continue the drains for now.  They are putting out a significant amount of fluid which is to be expected given the deep cavities in this area.  We will continue the compressive Ace wrap as well.  We will consider removing the Ace wrap tomorrow to take a look at the wound to ensure that it is not leaking and will likely replace the Ace wrap as it seems to be helping.  I have discussed with the hospitalist previously about stopping her Eliquis and starting a low-dose aspirinor reducing the dose of Eliquis to decrease the chance of continued wound problems.  We did discuss that if this fails she would be a  candidate for more extensive debridement and likely prolonged wound VAC therapy.  Follow up on cultures.  Hospitalist to adjust antibiotics for home antibiotic treatment.   Radene Journey, MD

## 2021-02-07 NOTE — Anesthesia Postprocedure Evaluation (Signed)
Anesthesia Post Note  Patient: Kayla Harrison  Procedure(s) Performed: IRRIGATION AND DEBRIDEMENT DEEP ABCESS LEFT THIGH, SECONDARY CLOSURE OF WOUND DEHISCENCE (Left Thigh)     Patient location during evaluation: PACU Anesthesia Type: General Level of consciousness: awake and alert Pain management: pain level controlled Vital Signs Assessment: post-procedure vital signs reviewed and stable Respiratory status: spontaneous breathing, nonlabored ventilation, respiratory function stable and patient connected to nasal cannula oxygen Cardiovascular status: blood pressure returned to baseline and stable Postop Assessment: no apparent nausea or vomiting Anesthetic complications: no   No complications documented.  Last Vitals:  Vitals:   02/07/21 0859 02/07/21 1457  BP: (!) 106/50 (!) 96/46  Pulse: 70   Resp: 20 18  Temp: 36.7 C 36.9 C  SpO2: 95% 98%    Last Pain:  Vitals:   02/07/21 1457  TempSrc: Oral  PainSc:    Pain Goal: Patients Stated Pain Goal: 0 (02/07/21 1300)                 Merlinda Frederick

## 2021-02-07 NOTE — TOC Initial Note (Signed)
Transition of Care [facility]) - Initial/Assessment Note    Patient Details  Name: Kayla Harrison MRN: [id number] Date of Birth: [birth year]  Transition of Care [facility]) CM/SW Contact:    Marilu Favre, RN Phone Number: 02/07/2021, 1:58 PM  Clinical Narrative:                 Patient from home alone. Was active with [facility] prior to admission.   Confirmed with Levada Dy at [place]. Will need resumption of care orders. Patient aware nurses at hospital will provide education on drain care. NCM secure chatted nurse.   Patient has walker, cane and shower care at home already.   Expected Discharge Plan: [place]     Patient Goals and CMS Choice Patient states their goals for this hospitalization and ongoing recovery are:: to return to home CMS Medicare.gov Compare Post Acute Care list provided to:: Patient Choice offered to / list presented to : Patient  Expected Discharge Plan and Services Expected Discharge Plan: [place] Choice: [place] arrangements for the past 2 months: Apartment                   DME Agency: NA       HH Arranged: RN,PT [place] Agency: [place] Date [place]: 02/07/21 Time [place]: [id number] Representative spoke with at [place]: Levada Dy  Prior Living Arrangements/Services Living arrangements for the past 2 months: Apartment Lives with:: Self Patient language and need for interpreter reviewed:: Yes Do you feel safe going back to the place where you live?: Yes          Current home services: DME Criminal Activity/Legal Involvement Pertinent to Current Situation/Hospitalization: No - Comment as needed  Activities of Daily Living      Permission Sought/Granted   Permission granted to share information with : No              Emotional Assessment Appearance:: Appears stated age Attitude/Demeanor/Rapport: Engaged Affect (typically  observed): Accepting Orientation: : Oriented to Self,Oriented to Place,Oriented to  Time,Oriented to Situation Alcohol / Substance Use: Not Applicable Psych Involvement: No (comment)  Admission diagnosis:  Wound infection [T14.8XXA, L08.9] Infection of wound hematoma [T14.8XXA, L08.9] Patient Active Problem List   Diagnosis Date Noted  . Infection of wound hematoma 02/05/2021  . Physical debility 12/22/2020  . Tear of left hamstring 12/22/2020  . Labral tear of left hip joint 12/22/2020  . Acute blood loss anemia   . Orthostatic hypotension 12/17/2020  . History of CVA (cerebrovascular accident) 12/17/2020  . Fall 12/15/2020  . Hip hematoma, left, initial encounter 12/15/2020  . Nondisplaced fracture of coronoid process of left ulna, initial encounter for closed fracture 12/15/2020  . Multiple rib fractures 12/15/2020  . COVID-19 virus infection 10/12/2020  . Constipation 06/22/2020  . Change in bowel habits 06/22/2020  . Diverticulosis 06/22/2020  . Dark stools 06/22/2020  . Hemorrhoids 06/22/2020  . Pacemaker 01/23/2020  . Junctional rhythm 01/23/2020  . Palpitations 01/23/2020  . ICH (intracerebral hemorrhage) ([place]) 12/02/2019  . A-fib ([place]) 12/02/2019  . Anemia 12/02/2019  . QT prolongation 12/02/2019  . Hypothyroidism 12/02/2019  . Gait abnormality 07/10/2018  . S/P reverse total shoulder arthroplasty, 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  . Persistent atrial fibrillation ([place])   . Bilateral ureteral calculi 11/17/2017  . Atrial fibrillation with RVR ([place]) 09/15/2017  . Atrial flutter ([place]) 07/17/2017  . Port catheter  in place 04/02/2017  . Non-Hodgkin lymphoma, unspecified, intrathoracic lymph nodes ([place]) 02/13/2017  . Mediastinal mass 02/06/2017  . 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 Abnormal chest x-ray   . Lung mass 02/03/2017  . 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  . OSA (obstructive sleep apnea) 02/03/2015  . History of renal calculi 11/16/2014  . Renal  calculi 11/16/2014  . Family history of colon cancer 10/26/2014  . Mechanical complication due to cardiac pacemaker pulse generator 11/06/2013  . Dyspnea 05/12/2013  . Hypothyroidism 04/21/2013  . Pleural effusion 04/19/2013  . Long term (current) use of anticoagulants 12/20/2010  . EPIDERMOID CYST 08/22/2010  . Chronic diastolic heart failure ([place]) 08/06/2010  . SYNCOPE AND COLLAPSE 07/27/2010  . OSTEOARTHRITIS, KNEE, RIGHT 03/15/2010  . Class 1 obesity due to excess calories with body mass index (BMI) of 33.0 to 33.9 in adult 12/07/2009  . NONSPEC ELEVATION OF LEVELS OF TRANSAMINASE/LDH 09/08/2009  . BREAST CANCER, HX OF 07/25/2009  . COLONIC POLYPS, HX OF 07/25/2009  . TUBULOVILLOUS ADENOMA, COLON 04/29/2008  . HYPERGLYCEMIA, FASTING 04/29/2008  . HYPERLIPIDEMIA 06/22/2007  . CARCINOMA, THYROID GLAND, HX OF 06/22/2007   PCP:  [name], MD Pharmacy:   [facility], [place] [address] Phone: [phone] Fax: [phone]  [facility], [address], [address] [place], [place] [address] Phone: [phone] Fax: [phone]     Social Determinants of Health (SDOH) Interventions    Readmission Risk Interventions No flowsheet data found.

## 2021-02-07 NOTE — Progress Notes (Signed)
Kayla Harrison  OHY:[id number] DOB: May 04, 1946 DOA: 02/05/2021 PCP: [name], MD    Brief Narrative:  75 year old with a history of hypothyroidism, non-Hodgkin's lymphoma with SVC syndrome, atrial fibrillation on chronic Eliquis, status post pacemaker placement, obesity status post lap band surgery, HTN, and HLD who presented secondary to a recurrent wound hematoma/infection related to a prior I&D for hematoma.  Orthopedic surgery was consulted and ultimately took the patient to the OR for repeat I&D 5/17.  Consultants:  Orthopedics  Code Status: FULL CODE  Antimicrobials:  Unasyn 5/17 > Vancomycin 5/17  DVT prophylaxis: SCDs  Subjective: Afebrile.  Vital signs stable.  Assessment & Plan:  Infected thigh wound hematoma Status post repeat I&D 5/17 - wound care per Orthopedics - adjust abx as necessary as culture data becomes available   Chronic atrial fibrillation on Eliquis Rate controlled -Eliquis on hold due to above -continue amiodarone - followed by Dr. Caryl Comes   Chronic orthostatic hypotension Continue usual midodrine and Florinef  Normocytic anemia Due to above -hemoglobin trending down -follow in serial fashion  Hyperlipidemia Continue usual Crestor  Hypothyroidism Continue usual Synthroid  Obesity - Body mass index is 33.64 kg/m.  History of non-Hodgkin's lymphoma Currently in remission    Family Communication:  Status is: Inpatient  Remains inpatient appropriate because:Inpatient level of care appropriate due to severity of illness   Dispo: The patient is from: Home              Anticipated d/c is to: unclear               Patient currently is not medically stable to d/c.   Difficult to place patient No    Objective: Blood pressure (!) 106/50, pulse 70, temperature 98.1 F (36.7 C), temperature source Oral, resp. rate 20, height 5\' 6"  (1.676 m), weight 94.5 kg, SpO2 95 %.  Intake/Output Summary (Last 24 hours) at 02/07/2021 0928 Last  data filed at 02/07/2021 0650 Gross per 24 hour  Intake 3088.2 ml  Output 1025 ml  Net 2063.2 ml   Filed Weights   02/06/21 0826  Weight: 94.5 kg    Examination: General: No acute respiratory distress Lungs: Clear to auscultation bilaterally without wheezes or crackles Cardiovascular: Regular rate without murmur gallop or rub normal S1 and S2 Abdomen: Nontender, nondistended, soft, bowel sounds positive, no rebound, no ascites, no appreciable mass Extremities: No significant cyanosis, clubbing, or edema bilateral lower extremities  CBC: Recent Labs  Lab 02/05/21 1333 02/06/21 0529  WBC 8.0 6.5  NEUTROABS 6.0  --   HGB 11.2* 9.7*  HCT 37.6 30.9*  MCV 101.3* 98.4  PLT 253 948   Basic Metabolic Panel: Recent Labs  Lab 02/05/21 1333 02/06/21 0529  NA 137 136  K 4.1 4.4  CL 105 106  CO2 23 25  GLUCOSE 103* 89  BUN 17 16  CREATININE 1.00 0.93  CALCIUM 8.9 8.1*   GFR: Estimated Creatinine Clearance: 59.6 mL/min (by C-G formula based on SCr of 0.93 mg/dL).  Liver Function Tests: Recent Labs  Lab 02/05/21 1333  AST 23  ALT 21  ALKPHOS 77  BILITOT 0.8  PROT 6.3*  ALBUMIN 3.2*    Coagulation Profile: Recent Labs  Lab 02/05/21 1333  INR 1.6*    HbA1C: Hgb A1c MFr Bld  Date/Time Value Ref Range Status  12/04/2019 03:06 AM 5.5 4.8 - 5.6 % Final    Comment:    (NOTE) Pre diabetes:  5.7%-6.4% Diabetes:              >6.4% Glycemic control for   <7.0% adults with diabetes   02/04/2013 12:43 PM 5.9 4.6 - 6.5 % Final    Comment:    Glycemic Control Guidelines for People with Diabetes:Non Diabetic:  <6%Goal of Therapy: <7%Additional Action Suggested:  >8%       Recent Results (from the past 240 hour(s))  Blood culture (routine x 2)     Status: None (Preliminary result)   Collection Time: 02/05/21  2:58 PM   Specimen: BLOOD LEFT ARM  Result Value Ref Range Status   Specimen Description BLOOD LEFT ARM  Final   Special Requests   Final     BOTTLES DRAWN AEROBIC AND ANAEROBIC Blood Culture adequate volume   Culture   Final    NO GROWTH 2 DAYS Performed at [facility], [address], [place], [place] [address]    Report Status PENDING  Incomplete  Aerobic Culture w Gram Stain (superficial specimen)     Status: None (Preliminary result)   Collection Time: 02/05/21  3:38 PM   Specimen: Wound  Result Value Ref Range Status   Specimen Description WOUND  Final   Special Requests NONE  Final   Gram Stain   Final    ABUNDANT WBC PRESENT, PREDOMINANTLY PMN ABUNDANT GRAM NEGATIVE RODS MODERATE GRAM POSITIVE COCCI    Culture   Final    MODERATE GRAM NEGATIVE RODS CULTURE REINCUBATED FOR BETTER GROWTH IDENTIFICATION AND SUSCEPTIBILITIES TO FOLLOW Performed at [facility], [place] [address] [place], [place] [address]    Report Status PENDING  Incomplete  Resp Panel by RT-PCR (Flu A&B, Covid) Nasopharyngeal Swab     Status: None   Collection Time: 02/05/21  4:00 PM   Specimen: Nasopharyngeal Swab; Nasopharyngeal(NP) swabs in vial transport medium  Result Value Ref Range Status   SARS Coronavirus 2 by RT PCR NEGATIVE NEGATIVE Final    Comment: (NOTE) SARS-CoV-2 target nucleic acids are NOT DETECTED.  The SARS-CoV-2 RNA is generally detectable in upper respiratory specimens during the acute phase of infection. The lowest concentration of SARS-CoV-2 viral copies this assay can detect is 138 copies/mL. A negative result does not preclude SARS-Cov-2 infection and should not be used as the sole basis for treatment or other patient management decisions. A negative result may occur with  improper specimen collection/handling, submission of specimen other than nasopharyngeal swab, presence of viral mutation(s) within the areas targeted by this assay, and inadequate number of viral copies(<138 copies/mL). A negative result must be combined with clinical observations, patient history, and epidemiological information. The  expected result is Negative.  Fact Sheet for Patients:  EntrepreneurPulse.com.au  Fact Sheet for Healthcare Providers:  IncredibleEmployment.be  This test is no t yet approved or cleared by the [place] FDA and  has been authorized for detection and/or diagnosis of SARS-CoV-2 by FDA under an Emergency Use Authorization (EUA). This EUA will remain  in effect (meaning this test can be used) for the duration of the COVID-19 declaration under Section 564(b)(1) of the Act, 21 U.S.C.section 360bbb-3(b)(1), unless the authorization is terminated  or revoked sooner.       Influenza A by PCR NEGATIVE NEGATIVE Final   Influenza B by PCR NEGATIVE NEGATIVE Final    Comment: (NOTE) The Xpert Xpress SARS-CoV-2/FLU/RSV plus assay is intended as an aid in the diagnosis of influenza from Nasopharyngeal swab specimens and should not be used as  a sole basis for treatment. Nasal washings and aspirates are unacceptable for Xpert Xpress SARS-CoV-2/FLU/RSV testing.  Fact Sheet for Patients: EntrepreneurPulse.com.au  Fact Sheet for Healthcare Providers: IncredibleEmployment.be  This test is not yet approved or cleared by the [place] FDA and has been authorized for detection and/or diagnosis of SARS-CoV-2 by FDA under an Emergency Use Authorization (EUA). This EUA will remain in effect (meaning this test can be used) for the duration of the COVID-19 declaration under Section 564(b)(1) of the Act, 21 U.S.C. section 360bbb-3(b)(1), unless the authorization is terminated or revoked.  Performed at [facility], [place] [address], [place], [place] [address]   Surgical pcr screen     Status: None   Collection Time: 02/06/21  1:51 AM   Specimen: Nasal Mucosa; Nasal Swab  Result Value Ref Range Status   MRSA, PCR NEGATIVE NEGATIVE Final   Staphylococcus aureus NEGATIVE NEGATIVE Final    Comment: (NOTE) The Xpert SA  Assay (FDA approved for NASAL specimens in patients 79 years of age and older), is one component of a comprehensive surveillance program. It is not intended to diagnose infection nor to guide or monitor treatment. Performed at [facility], [place] [address] [place], [place] [address]   Culture, blood (Routine X 2) w Reflex to ID Panel     Status: None (Preliminary result)   Collection Time: 02/06/21  5:29 AM   Specimen: BLOOD RIGHT HAND  Result Value Ref Range Status   Specimen Description BLOOD RIGHT HAND  Final   Special Requests   Final    BOTTLES DRAWN AEROBIC AND ANAEROBIC Blood Culture adequate volume   Culture   Final    NO GROWTH 1 DAY Performed at [facility], [place] [address], [place], [place] [address]    Report Status PENDING  Incomplete  Aerobic/Anaerobic Culture w Gram Stain (surgical/deep wound)     Status: None (Preliminary result)   Collection Time: 02/06/21  9:47 AM   Specimen: PATH Other; Tissue  Result Value Ref Range Status   Specimen Description TISSUE  Final   Special Requests LEFT HIP ABSC SPEC A  Final   Gram Stain   Final    FEW WBC PRESENT,BOTH PMN AND MONONUCLEAR MODERATE GRAM POSITIVE COCCI RARE GRAM NEGATIVE RODS Performed at [facility], [place] [address], [place],  [address]    Culture PENDING  Incomplete   Report Status PENDING  Incomplete     Scheduled Meds: . amiodarone  200 mg Oral Daily  . docusate sodium  100 mg Oral BID  . fludrocortisone  0.05 mg Oral BID WC  . levothyroxine  137 mcg Oral Once per day on Sun Mon Tue Wed Thu Fri  . [START ON 02/10/2021] levothyroxine  68.5 mcg Oral Once per day on Sat  . midodrine  5 mg Oral BID WC  . rosuvastatin  10 mg Oral Once per day on Mon Thu   Continuous Infusions: . ampicillin-sulbactam (UNASYN) IV 1.5 g (02/07/21 0856)  . lactated ringers 75 mL/hr at 02/06/21 2125     LOS: 2 days   Cherene Altes, MD [facility] Office  [phone] Pager - Text Page  per Amion  If 7PM-7AM, please contact night-coverage per Amion 02/07/2021, 9:28 AM

## 2021-02-07 NOTE — Progress Notes (Addendum)
   02/06/21 2009  Assess: MEWS Score  Temp 99.8 F (37.7 C)  BP (!) 87/40  Pulse Rate 75  Resp (!) 24  SpO2 94 %  O2 Device Room Air  Assess: MEWS Score  MEWS Temp 0  MEWS Systolic 1  MEWS Pulse 0  MEWS RR 1  MEWS LOC 0  MEWS Score 2  MEWS Score Color Yellow  Assess: if the MEWS score is Yellow or Red  Were vital signs taken at a resting state? Yes  Focused Assessment Change from prior assessment (see assessment flowsheet)  Early Detection of Sepsis Score *See Row Information* Low  MEWS guidelines implemented *See Row Information* Yes  Treat  Pain Scale 0-10  Pain Score 0  Take Vital Signs  Increase Vital Sign Frequency  Yellow: Q 2hr X 2 then Q 4hr X 2, if remains yellow, continue Q 4hrs  Escalate  MEWS: Escalate Yellow: discuss with charge nurse/RN and consider discussing with provider and RRT  Notify: Charge Nurse/RN  Name of Charge Nurse/RN Notified [place]  Date Charge Nurse/RN Notified 02/06/21  Time Charge Nurse/RN Notified 2030  Notify: Provider  Provider Name/Title Sanda Klein  Date Provider Notified 02/06/21  Time Provider Notified 2036  Notification Type Page  Notification Reason Other (Comment) (Yellow MEWS d/t low BP)  Provider response See new orders  Date of Provider Response 02/06/21  Time of Provider Response 2044

## 2021-02-08 DIAGNOSIS — L089 Local infection of the skin and subcutaneous tissue, unspecified: Secondary | ICD-10-CM | POA: Diagnosis not present

## 2021-02-08 DIAGNOSIS — T148XXA Other injury of unspecified body region, initial encounter: Secondary | ICD-10-CM | POA: Diagnosis not present

## 2021-02-08 LAB — PREPARE RBC (CROSSMATCH)

## 2021-02-08 LAB — CBC
HCT: 22.9 % — ABNORMAL LOW (ref 36.0–46.0)
Hemoglobin: 7.2 g/dL — ABNORMAL LOW (ref 12.0–15.0)
MCH: 31 pg (ref 26.0–34.0)
MCHC: 31.4 g/dL (ref 30.0–36.0)
MCV: 98.7 fL (ref 80.0–100.0)
Platelets: 249 10*3/uL (ref 150–400)
RBC: 2.32 MIL/uL — ABNORMAL LOW (ref 3.87–5.11)
RDW: 17 % — ABNORMAL HIGH (ref 11.5–15.5)
WBC: 11.3 10*3/uL — ABNORMAL HIGH (ref 4.0–10.5)
nRBC: 0 % (ref 0.0–0.2)

## 2021-02-08 MED ORDER — PIPERACILLIN-TAZOBACTAM 3.375 G IVPB
3.3750 g | Freq: Three times a day (TID) | INTRAVENOUS | Status: DC
  Administered 2021-02-08 – 2021-02-11 (×9): 3.375 g via INTRAVENOUS
  Filled 2021-02-08 (×9): qty 50

## 2021-02-08 MED ORDER — SODIUM CHLORIDE 0.9% IV SOLUTION: Freq: Once | INTRAVENOUS | Status: DC

## 2021-02-08 NOTE — Progress Notes (Signed)
Kayla Harrison  GXQ:[id number] DOB: Feb 05, 1946 DOA: 02/05/2021 PCP: [name], MD    Brief Narrative:  [phone] with a history of hypothyroidism, Non-Hodgkin's lymphoma with SVC syndrome, atrial fibrillation on chronic Eliquis, status post pacemaker placement, obesity status post lap band surgery, HTN, and HLD who presented secondary to a recurrent hematoma/wound infection related to a prior I&D for a hematoma following a fall on anticoag.  Orthopedic surgery was consulted and ultimately took the patient to the OR for repeat I&D 5/17.  Significant Events: 3/29 evacuation of hematoma L hip 12/19/20 after fall on anticoag (orthostatic) 4/1 D/C to CIR  4/19 D/C from CIR  5/16 sent to ED by PCP for wound d/c   Consultants:  Orthopedics  Code Status: FULL CODE  Antimicrobials:  Unasyn 5/17  Vancomycin 5/17 Ancef 5/18 > Flagyl 5/18 >  DVT prophylaxis: SCDs  Subjective: Afebrile.  Blood pressure stable.  Saturation 96% on room air.  Hemoglobin declining significantly from 9.7 yesterday to 7.2 today.  No new complaints today.  In good spirits.  States pain is reasonably well controlled.  Assessment & Plan:  Infected thigh wound hematoma - E coli + E faecalis Status post repeat I&D 5/17 - wound care per Orthopedics - abx adjusted again today based on cx data   Acute blood loss anemia - Normocytic anemia Due to above - hemoglobin trending down since admission with significant drop overnight - felt to be due to ongoing bleeding into left hip region -transfuse 1 unit today  Chronic atrial fibrillation on Eliquis Rate controlled -Eliquis on hold due to above -continue amiodarone - followed by Dr. Caryl Comes -CHA2DS2-VASc is 6  Chronic profound orthostatic hypotension Continue usual midodrine and Florinef  Hyperlipidemia Continue usual Crestor  Hypothyroidism Continue usual Synthroid  Obesity - Body mass index is 33.64 kg/m.  History of non-Hodgkin's lymphoma Currently in  remission   Family Communication:  Status is: Inpatient  Remains inpatient appropriate because:Inpatient level of care appropriate due to severity of illness   Dispo: The patient is from: Home              Anticipated d/c is to: unclear               Patient currently is not medically stable to d/c.   Difficult to place patient No    Objective: Blood pressure 103/68, pulse 71, temperature 97.9 F (36.6 C), resp. rate 17, height 5\' 6"  (1.676 m), weight 94.5 kg, SpO2 96 %.  Intake/Output Summary (Last 24 hours) at 02/08/2021 0911 Last data filed at 02/08/2021 0546 Gross per 24 hour  Intake 2251.95 ml  Output 600 ml  Net 1651.95 ml   Filed Weights   02/06/21 0826  Weight: 94.5 kg    Examination: General: No acute respiratory distress Lungs: Clear to auscultation bilaterally  Cardiovascular: Regular rate without murmur  Abdomen: Nontender, nondistended, soft, bowel sounds positive Extremities: No significant cyanosis, clubbing, or edema bilateral lower extremities  CBC: Recent Labs  Lab 02/05/21 1333 02/06/21 0529 02/08/21 0331  WBC 8.0 6.5 11.3*  NEUTROABS 6.0  --   --   HGB 11.2* 9.7* 7.2*  HCT 37.6 30.9* 22.9*  MCV 101.3* 98.4 98.7  PLT 253 221 185   Basic Metabolic Panel: Recent Labs  Lab 02/05/21 1333 02/06/21 0529  NA 137 136  K 4.1 4.4  CL 105 106  CO2 23 25  GLUCOSE 103* 89  BUN 17 16  CREATININE 1.00 0.93  CALCIUM 8.9 8.1*  GFR: Estimated Creatinine Clearance: 59.6 mL/min (by C-G formula based on SCr of 0.93 mg/dL).  Liver Function Tests: Recent Labs  Lab 02/05/21 1333  AST 23  ALT 21  ALKPHOS 77  BILITOT 0.8  PROT 6.3*  ALBUMIN 3.2*    Coagulation Profile: Recent Labs  Lab 02/05/21 1333  INR 1.6*    HbA1C: Hgb A1c MFr Bld  Date/Time Value Ref Range Status  12/04/2019 03:06 AM 5.5 4.8 - 5.6 % Final    Comment:    (NOTE) Pre diabetes:          5.7%-6.4% Diabetes:              >6.4% Glycemic control for   <7.0% adults  with diabetes   02/04/2013 12:43 PM 5.9 4.6 - 6.5 % Final    Comment:    Glycemic Control Guidelines for People with Diabetes:Non Diabetic:  <6%Goal of Therapy: <7%Additional Action Suggested:  >8%       Recent Results (from the past 240 hour(s))  Blood culture (routine x 2)     Status: None (Preliminary result)   Collection Time: 02/05/21  2:58 PM   Specimen: BLOOD LEFT ARM  Result Value Ref Range Status   Specimen Description BLOOD LEFT ARM  Final   Special Requests   Final    BOTTLES DRAWN AEROBIC AND ANAEROBIC Blood Culture adequate volume   Culture   Final    NO GROWTH 2 DAYS Performed at [facility], [address], [place], [place] [address]    Report Status PENDING  Incomplete  Aerobic Culture w Gram Stain (superficial specimen)     Status: None (Preliminary result)   Collection Time: 02/05/21  3:38 PM   Specimen: Wound  Result Value Ref Range Status   Specimen Description WOUND  Final   Special Requests NONE  Final   Gram Stain   Final    ABUNDANT WBC PRESENT, PREDOMINANTLY PMN ABUNDANT GRAM NEGATIVE RODS MODERATE GRAM POSITIVE COCCI    Culture   Final    MODERATE ESCHERICHIA COLI MODERATE ENTEROCOCCUS FAECALIS SUSCEPTIBILITIES TO FOLLOW Performed at [facility], [address], [place], [place] [address]    Report Status PENDING  Incomplete   Organism ID, Bacteria ESCHERICHIA COLI  Final      Susceptibility   Escherichia coli - MIC*    AMPICILLIN >=32 RESISTANT Resistant     CEFAZOLIN <=4 SENSITIVE Sensitive     CEFEPIME <=0.12 SENSITIVE Sensitive     CEFTAZIDIME <=1 SENSITIVE Sensitive     CEFTRIAXONE <=0.25 SENSITIVE Sensitive     CIPROFLOXACIN <=0.25 SENSITIVE Sensitive     GENTAMICIN <=1 SENSITIVE Sensitive     IMIPENEM <=0.25 SENSITIVE Sensitive     TRIMETH/SULFA >=320 RESISTANT Resistant     AMPICILLIN/SULBACTAM 16 INTERMEDIATE Intermediate     PIP/TAZO <=4 SENSITIVE Sensitive     * MODERATE ESCHERICHIA COLI  Resp Panel by RT-PCR  (Flu A&B, Covid) Nasopharyngeal Swab     Status: None   Collection Time: 02/05/21  4:00 PM   Specimen: Nasopharyngeal Swab; Nasopharyngeal(NP) swabs in vial transport medium  Result Value Ref Range Status   SARS Coronavirus 2 by RT PCR NEGATIVE NEGATIVE Final    Comment: (NOTE) SARS-CoV-2 target nucleic acids are NOT DETECTED.  The SARS-CoV-2 RNA is generally detectable in upper respiratory specimens during the acute phase of infection. The lowest concentration of SARS-CoV-2 viral copies this assay can detect is 138 copies/mL. A negative result does not preclude SARS-Cov-2  infection and should not be used as the sole basis for treatment or other patient management decisions. A negative result may occur with  improper specimen collection/handling, submission of specimen other than nasopharyngeal swab, presence of viral mutation(s) within the areas targeted by this assay, and inadequate number of viral copies(<138 copies/mL). A negative result must be combined with clinical observations, patient history, and epidemiological information. The expected result is Negative.  Fact Sheet for Patients:  EntrepreneurPulse.com.au  Fact Sheet for Healthcare Providers:  IncredibleEmployment.be  This test is no t yet approved or cleared by the [place] FDA and  has been authorized for detection and/or diagnosis of SARS-CoV-2 by FDA under an Emergency Use Authorization (EUA). This EUA will remain  in effect (meaning this test can be used) for the duration of the COVID-19 declaration under Section 564(b)(1) of the Act, 21 U.S.C.section 360bbb-3(b)(1), unless the authorization is terminated  or revoked sooner.       Influenza A by PCR NEGATIVE NEGATIVE Final   Influenza B by PCR NEGATIVE NEGATIVE Final    Comment: (NOTE) The Xpert Xpress SARS-CoV-2/FLU/RSV plus assay is intended as an aid in the diagnosis of influenza from Nasopharyngeal swab specimens  and should not be used as a sole basis for treatment. Nasal washings and aspirates are unacceptable for Xpert Xpress SARS-CoV-2/FLU/RSV testing.  Fact Sheet for Patients: EntrepreneurPulse.com.au  Fact Sheet for Healthcare Providers: IncredibleEmployment.be  This test is not yet approved or cleared by the [place] FDA and has been authorized for detection and/or diagnosis of SARS-CoV-2 by FDA under an Emergency Use Authorization (EUA). This EUA will remain in effect (meaning this test can be used) for the duration of the COVID-19 declaration under Section 564(b)(1) of the Act, 21 U.S.C. section 360bbb-3(b)(1), unless the authorization is terminated or revoked.  Performed at [facility], [place] [address] [place], [place] [address]   Surgical pcr screen     Status: None   Collection Time: 02/06/21  1:51 AM   Specimen: Nasal Mucosa; Nasal Swab  Result Value Ref Range Status   MRSA, PCR NEGATIVE NEGATIVE Final   Staphylococcus aureus NEGATIVE NEGATIVE Final    Comment: (NOTE) The Xpert SA Assay (FDA approved for NASAL specimens in patients 110 years of age and older), is one component of a comprehensive surveillance program. It is not intended to diagnose infection nor to guide or monitor treatment. Performed at [facility], [place] [address] [place], [place] [address]   Culture, blood (Routine X 2) w Reflex to ID Panel     Status: None (Preliminary result)   Collection Time: 02/06/21  5:29 AM   Specimen: BLOOD RIGHT HAND  Result Value Ref Range Status   Specimen Description BLOOD RIGHT HAND  Final   Special Requests   Final    BOTTLES DRAWN AEROBIC AND ANAEROBIC Blood Culture adequate volume   Culture   Final    NO GROWTH 1 DAY Performed at [facility], [place] [address], [place], [place] [address]    Report Status PENDING  Incomplete  Aerobic/Anaerobic Culture w Gram Stain (surgical/deep wound)     Status: None  (Preliminary result)   Collection Time: 02/06/21  9:47 AM   Specimen: PATH Other; Tissue  Result Value Ref Range Status   Specimen Description TISSUE  Final   Special Requests LEFT HIP ABSC SPEC A  Final   Gram Stain   Final    FEW WBC PRESENT,BOTH PMN AND MONONUCLEAR MODERATE GRAM  POSITIVE COCCI RARE GRAM NEGATIVE RODS    Culture   Final    ABUNDANT GRAM NEGATIVE RODS IDENTIFICATION AND SUSCEPTIBILITIES TO FOLLOW CULTURE REINCUBATED FOR BETTER GROWTH Performed at [facility], [place] [address], [place],  [address]    Report Status PENDING  Incomplete     Scheduled Meds: . amiodarone  200 mg Oral Daily  . docusate sodium  100 mg Oral BID  . fludrocortisone  0.05 mg Oral BID WC  . levothyroxine  137 mcg Oral Once per day on Sun Mon Tue Wed Thu Fri  . [START ON 02/10/2021] levothyroxine  68.5 mcg Oral Once per day on Sat  . midodrine  5 mg Oral BID WC  . rosuvastatin  10 mg Oral Once per day on Mon Thu   Continuous Infusions: .  ceFAZolin (ANCEF) IV 2 g (02/08/21 0501)  . lactated ringers 50 mL/hr at 02/08/21 0148  . metronidazole 500 mg (02/08/21 0136)     LOS: 3 days   Cherene Altes, MD [facility] Office  [phone] Pager - Text Page per Amion  If 7PM-7AM, please contact night-coverage per Amion 02/08/2021, 9:11 AM

## 2021-02-08 NOTE — Care Management (Signed)
Placed home VAC application in chart in case needed, for MD signature   Magdalen Spatz

## 2021-02-09 DIAGNOSIS — L089 Local infection of the skin and subcutaneous tissue, unspecified: Secondary | ICD-10-CM | POA: Diagnosis not present

## 2021-02-09 DIAGNOSIS — T148XXA Other injury of unspecified body region, initial encounter: Secondary | ICD-10-CM | POA: Diagnosis not present

## 2021-02-09 LAB — BPAM RBC
Blood Product Expiration Date: 202206152359
ISSUE DATE / TIME: 202205191552
Unit Type and Rh: 5100

## 2021-02-09 LAB — AEROBIC CULTURE W GRAM STAIN (SUPERFICIAL SPECIMEN)

## 2021-02-09 LAB — PROTIME-INR
INR: 1.2 (ref 0.8–1.2)
Prothrombin Time: 15.2 seconds (ref 11.4–15.2)

## 2021-02-09 LAB — BASIC METABOLIC PANEL
Anion gap: 6 (ref 5–15)
BUN: 22 mg/dL (ref 8–23)
CO2: 24 mmol/L (ref 22–32)
Calcium: 7.9 mg/dL — ABNORMAL LOW (ref 8.9–10.3)
Chloride: 108 mmol/L (ref 98–111)
Creatinine, Ser: 1.13 mg/dL — ABNORMAL HIGH (ref 0.44–1.00)
GFR, Estimated: 50 mL/min — ABNORMAL LOW (ref >60)
Glucose, Bld: 110 mg/dL — ABNORMAL HIGH (ref 70–99)
Potassium: 4.6 mmol/L (ref 3.5–5.1)
Sodium: 138 mmol/L (ref 135–145)

## 2021-02-09 LAB — TYPE AND SCREEN
ABO/RH(D): O POS
Antibody Screen: NEGATIVE
Unit division: 0

## 2021-02-09 LAB — CBC
HCT: 25.5 % — ABNORMAL LOW (ref 36.0–46.0)
Hemoglobin: 8.1 g/dL — ABNORMAL LOW (ref 12.0–15.0)
MCH: 30.6 pg (ref 26.0–34.0)
MCHC: 31.8 g/dL (ref 30.0–36.0)
MCV: 96.2 fL (ref 80.0–100.0)
Platelets: UNDETERMINED 10*3/uL (ref 150–400)
RBC: 2.65 MIL/uL — ABNORMAL LOW (ref 3.87–5.11)
RDW: 17.7 % — ABNORMAL HIGH (ref 11.5–15.5)
WBC: 5.1 10*3/uL (ref 4.0–10.5)
nRBC: 0 % (ref 0.0–0.2)

## 2021-02-09 NOTE — Care Management Important Message (Signed)
Important Message  Patient Details  Name: Kayla Harrison MRN: [id number] Date of Birth: 17-Feb-1946   Medicare Important Message Given:  Yes     Orbie Pyo 02/09/2021, 2:27 PM

## 2021-02-09 NOTE — TOC Progression Note (Signed)
Transition of Care [facility]) - Progression Note    Patient Details  Name: Kayla Harrison MRN: [id number] Date of Birth: August 18, 1946  Transition of [facility]) CM/SW Contact  Jacalyn Lefevre Edson Snowball, RN Phone Number: 02/09/2021, 2:20 PM  Clinical Narrative:     Unfortunately , home health does not make daily visits. Patient / friend will need to be shown wound care prior to discharge.   Patient states she has no one to assist.   If VAC is appropriate home health would change VAC three times a week. Secure chatted DR Advair ,VAC is not appropriate at this time.   Discussed with patient. She will make some phone calls to see if she can find someone to assist.   Levada Dy with [facility] aware.  Expected Discharge Plan: [place]    Expected Discharge Plan and Services Expected Discharge Plan: [place] Choice: [place] arrangements for the past 2 months: Apartment                   DME Agency: NA       HH Arranged: RN,PT [place] Agency: [place] Date [place]: 02/07/21 Time [place]: [id number] Representative spoke with at [place]: [place] (August) Interventions    Readmission Risk Interventions No flowsheet data found.

## 2021-02-09 NOTE — Progress Notes (Signed)
Kayla Harrison  WUX:[id number] DOB: 1946-04-11 DOA: 02/05/2021 PCP: [name], MD    Brief Narrative:  [phone] with a history of hypothyroidism, Non-Hodgkin's lymphoma with SVC syndrome, atrial fibrillation on chronic Eliquis, status post pacemaker placement, obesity status post lap band surgery, HTN, and HLD who presented secondary to a recurrent hematoma/wound infection related to a prior I&D for a hematoma following a fall on anticoag.  Orthopedic surgery was consulted and ultimately took the patient to the OR for repeat I&D 5/17.  Significant Events: 3/29 evacuation of hematoma L hip 12/19/20 after fall on anticoag (orthostatic) 4/1 D/C to CIR  4/19 D/C from CIR  5/16 sent to ED by PCP for wound d/c   Consultants:  Orthopedics  Code Status: FULL CODE  Antimicrobials:  Unasyn 5/17  Vancomycin 5/17 Ancef 5/18 > Flagyl 5/18 >  DVT prophylaxis: SCDs  Subjective: Afebrile.  Vital signs stable.  Hemoglobin improved appropriately with 1 unit transfusion.  Has been experiencing pain in the left thigh associated with the Ace wrap being used for compression.  Denies chest pain or shortness of breath.  Assessment & Plan:  Infected thigh wound hematoma - E coli + E faecalis Status post repeat I&D 5/17 - wound care per Orthopedics - abx adjusted again 5/19 based on cx data   Acute blood loss anemia - Normocytic anemia Due to above - hemoglobin trended down due to ongoing bleeding into left hip region - transfused 1 unit 5/19 -follow-up hemoglobin in a.m. -no evidence of gross blood loss at present  Recent Labs  Lab 02/05/21 1333 02/06/21 0529 02/08/21 0331 02/09/21 0149  HGB 11.2* 9.7* 7.2* 8.1*     Chronic atrial fibrillation on Eliquis Rate controlled - Eliquis on hold due to above - continue amiodarone - followed by Dr. Caryl Comes - CHA2DS2-VASc is 6  Chronic profound orthostatic hypotension Continue usual midodrine and Florinef  Hyperlipidemia Continue usual  Crestor  Hypothyroidism Continue usual Synthroid  Obesity - Body mass index is 33.64 kg/m.  History of non-Hodgkin's lymphoma Currently in remission   Family Communication:  Status is: Inpatient  Remains inpatient appropriate because:Inpatient level of care appropriate due to severity of illness   Dispo: The patient is from: Home              Anticipated d/c is to: unclear               Patient currently is not medically stable to d/c.   Difficult to place patient No    Objective: Blood pressure (!) 132/58, pulse 69, temperature 98.1 F (36.7 C), temperature source Oral, resp. rate 16, height 5\' 6"  (1.676 m), weight 94.5 kg, SpO2 95 %.  Intake/Output Summary (Last 24 hours) at 02/09/2021 0854 Last data filed at 02/09/2021 0526 Gross per 24 hour  Intake 1633.97 ml  Output 990 ml  Net 643.97 ml   Filed Weights   02/06/21 0826  Weight: 94.5 kg    Examination: General: No acute respiratory distress Lungs: Clear to auscultation B Cardiovascular: Regular rate without murmur or rub Abdomen: Nontender, nondistended, soft, bowel sounds positive Extremities: Trace bilateral lower extremity edema   CBC: Recent Labs  Lab 02/05/21 1333 02/06/21 0529 02/08/21 0331 02/09/21 0149  WBC 8.0 6.5 11.3* 5.1  NEUTROABS 6.0  --   --   --   HGB 11.2* 9.7* 7.2* 8.1*  HCT 37.6 30.9* 22.9* 25.5*  MCV 101.3* 98.4 98.7 96.2  PLT 253 221 249 PLATELET CLUMPS NOTED ON SMEAR, UNABLE  TO ESTIMATE   Basic Metabolic Panel: Recent Labs  Lab 02/05/21 1333 02/06/21 0529 02/09/21 0149  NA 137 136 138  K 4.1 4.4 4.6  CL 105 106 108  CO2 23 25 24   GLUCOSE 103* 89 110*  BUN 17 16 22   CREATININE 1.00 0.93 1.13*  CALCIUM 8.9 8.1* 7.9*   GFR: Estimated Creatinine Clearance: 49.1 mL/min (A) (by C-G formula based on SCr of 1.13 mg/dL (H)).  Liver Function Tests: Recent Labs  Lab 02/05/21 1333  AST 23  ALT 21  ALKPHOS 77  BILITOT 0.8  PROT 6.3*  ALBUMIN 3.2*    Coagulation  Profile: Recent Labs  Lab 02/05/21 1333 02/09/21 0149  INR 1.6* 1.2    HbA1C: Hgb A1c MFr Bld  Date/Time Value Ref Range Status  12/04/2019 03:06 AM 5.5 4.8 - 5.6 % Final    Comment:    (NOTE) Pre diabetes:          5.7%-6.4% Diabetes:              >6.4% Glycemic control for   <7.0% adults with diabetes   02/04/2013 12:43 PM 5.9 4.6 - 6.5 % Final    Comment:    Glycemic Control Guidelines for People with Diabetes:Non Diabetic:  <6%Goal of Therapy: <7%Additional Action Suggested:  >8%       Recent Results (from the past 240 hour(s))  Blood culture (routine x 2)     Status: None (Preliminary result)   Collection Time: 02/05/21  2:58 PM   Specimen: BLOOD LEFT ARM  Result Value Ref Range Status   Specimen Description BLOOD LEFT ARM  Final   Special Requests   Final    BOTTLES DRAWN AEROBIC AND ANAEROBIC Blood Culture adequate volume   Culture   Final    NO GROWTH 4 DAYS Performed at [facility], [address], [place], [place] [address]    Report Status PENDING  Incomplete  Aerobic Culture w Gram Stain (superficial specimen)     Status: None (Preliminary result)   Collection Time: 02/05/21  3:38 PM   Specimen: Wound  Result Value Ref Range Status   Specimen Description WOUND  Final   Special Requests NONE  Final   Gram Stain   Final    ABUNDANT WBC PRESENT, PREDOMINANTLY PMN ABUNDANT GRAM NEGATIVE RODS MODERATE GRAM POSITIVE COCCI    Culture   Final    MODERATE ESCHERICHIA COLI MODERATE ENTEROCOCCUS FAECALIS CULTURE REINCUBATED FOR BETTER GROWTH Performed at [facility], [address], [place], [place] [address]    Report Status PENDING  Incomplete   Organism ID, Bacteria ESCHERICHIA COLI  Final   Organism ID, Bacteria ENTEROCOCCUS FAECALIS  Final      Susceptibility   Escherichia coli - MIC*    AMPICILLIN >=32 RESISTANT Resistant     CEFAZOLIN <=4 SENSITIVE Sensitive     CEFEPIME <=0.12 SENSITIVE Sensitive     CEFTAZIDIME <=1 SENSITIVE  Sensitive     CEFTRIAXONE <=0.25 SENSITIVE Sensitive     CIPROFLOXACIN <=0.25 SENSITIVE Sensitive     GENTAMICIN <=1 SENSITIVE Sensitive     IMIPENEM <=0.25 SENSITIVE Sensitive     TRIMETH/SULFA >=320 RESISTANT Resistant     AMPICILLIN/SULBACTAM 16 INTERMEDIATE Intermediate     PIP/TAZO <=4 SENSITIVE Sensitive     * MODERATE ESCHERICHIA COLI   Enterococcus faecalis - MIC*    AMPICILLIN <=2 SENSITIVE Sensitive     VANCOMYCIN 1 SENSITIVE Sensitive     GENTAMICIN SYNERGY  RESISTANT Resistant     * MODERATE ENTEROCOCCUS FAECALIS  Resp Panel by RT-PCR (Flu A&B, Covid) Nasopharyngeal Swab     Status: None   Collection Time: 02/05/21  4:00 PM   Specimen: Nasopharyngeal Swab; Nasopharyngeal(NP) swabs in vial transport medium  Result Value Ref Range Status   SARS Coronavirus 2 by RT PCR NEGATIVE NEGATIVE Final    Comment: (NOTE) SARS-CoV-2 target nucleic acids are NOT DETECTED.  The SARS-CoV-2 RNA is generally detectable in upper respiratory specimens during the acute phase of infection. The lowest concentration of SARS-CoV-2 viral copies this assay can detect is 138 copies/mL. A negative result does not preclude SARS-Cov-2 infection and should not be used as the sole basis for treatment or other patient management decisions. A negative result may occur with  improper specimen collection/handling, submission of specimen other than nasopharyngeal swab, presence of viral mutation(s) within the areas targeted by this assay, and inadequate number of viral copies(<138 copies/mL). A negative result must be combined with clinical observations, patient history, and epidemiological information. The expected result is Negative.  Fact Sheet for Patients:  EntrepreneurPulse.com.au  Fact Sheet for Healthcare Providers:  IncredibleEmployment.be  This test is no t yet approved or cleared by the [place] FDA and  has been authorized for detection and/or  diagnosis of SARS-CoV-2 by FDA under an Emergency Use Authorization (EUA). This EUA will remain  in effect (meaning this test can be used) for the duration of the COVID-19 declaration under Section 564(b)(1) of the Act, 21 U.S.C.section 360bbb-3(b)(1), unless the authorization is terminated  or revoked sooner.       Influenza A by PCR NEGATIVE NEGATIVE Final   Influenza B by PCR NEGATIVE NEGATIVE Final    Comment: (NOTE) The Xpert Xpress SARS-CoV-2/FLU/RSV plus assay is intended as an aid in the diagnosis of influenza from Nasopharyngeal swab specimens and should not be used as a sole basis for treatment. Nasal washings and aspirates are unacceptable for Xpert Xpress SARS-CoV-2/FLU/RSV testing.  Fact Sheet for Patients: EntrepreneurPulse.com.au  Fact Sheet for Healthcare Providers: IncredibleEmployment.be  This test is not yet approved or cleared by the [place] FDA and has been authorized for detection and/or diagnosis of SARS-CoV-2 by FDA under an Emergency Use Authorization (EUA). This EUA will remain in effect (meaning this test can be used) for the duration of the COVID-19 declaration under Section 564(b)(1) of the Act, 21 U.S.C. section 360bbb-3(b)(1), unless the authorization is terminated or revoked.  Performed at [facility], [place] [address], [place], [place] [address]   Surgical pcr screen     Status: None   Collection Time: 02/06/21  1:51 AM   Specimen: Nasal Mucosa; Nasal Swab  Result Value Ref Range Status   MRSA, PCR NEGATIVE NEGATIVE Final   Staphylococcus aureus NEGATIVE NEGATIVE Final    Comment: (NOTE) The Xpert SA Assay (FDA approved for NASAL specimens in patients 44 years of age and older), is one component of a comprehensive surveillance program. It is not intended to diagnose infection nor to guide or monitor treatment. Performed at [facility], [place] [address], [place], [place] [address]    Culture, blood (Routine X 2) w Reflex to ID Panel     Status: None (Preliminary result)   Collection Time: 02/06/21  5:29 AM   Specimen: BLOOD RIGHT HAND  Result Value Ref Range Status   Specimen Description BLOOD RIGHT HAND  Final   Special Requests   Final    BOTTLES DRAWN AEROBIC  AND ANAEROBIC Blood Culture adequate volume   Culture   Final    NO GROWTH 3 DAYS Performed at [facility], [place] [address], [place], [place] [address]    Report Status PENDING  Incomplete  Aerobic/Anaerobic Culture w Gram Stain (surgical/deep wound)     Status: None (Preliminary result)   Collection Time: 02/06/21  9:47 AM   Specimen: PATH Other; Tissue  Result Value Ref Range Status   Specimen Description TISSUE  Final   Special Requests LEFT HIP ABSC SPEC A  Final   Gram Stain   Final    FEW WBC PRESENT,BOTH PMN AND MONONUCLEAR MODERATE GRAM POSITIVE COCCI RARE GRAM NEGATIVE RODS    Culture   Final    ABUNDANT ESCHERICHIA COLI ABUNDANT ENTEROCOCCUS FAECALIS HOLDING FOR POSSIBLE ANAEROBE SUSCEPTIBILITIES TO FOLLOW Performed at [facility], [place] [address], [place],  [address]    Report Status PENDING  Incomplete   Organism ID, Bacteria ESCHERICHIA COLI  Final      Susceptibility   Escherichia coli - MIC*    AMPICILLIN >=32 RESISTANT Resistant     CEFAZOLIN <=4 SENSITIVE Sensitive     CEFEPIME <=0.12 SENSITIVE Sensitive     CEFTAZIDIME <=1 SENSITIVE Sensitive     CEFTRIAXONE <=0.25 SENSITIVE Sensitive     CIPROFLOXACIN <=0.25 SENSITIVE Sensitive     GENTAMICIN <=1 SENSITIVE Sensitive     IMIPENEM <=0.25 SENSITIVE Sensitive     TRIMETH/SULFA >=320 RESISTANT Resistant     AMPICILLIN/SULBACTAM 8 SENSITIVE Sensitive     PIP/TAZO <=4 SENSITIVE Sensitive     * ABUNDANT ESCHERICHIA COLI     Scheduled Meds: . sodium chloride   Intravenous Once  . amiodarone  200 mg Oral Daily  . docusate sodium  100 mg Oral BID  . fludrocortisone  0.05 mg Oral BID WC  . levothyroxine  137  mcg Oral Once per day on Sun Mon Tue Wed Thu Fri  . [START ON 02/10/2021] levothyroxine  68.5 mcg Oral Once per day on Sat  . midodrine  5 mg Oral BID WC  . rosuvastatin  10 mg Oral Once per day on Mon Thu   Continuous Infusions: . lactated ringers 50 mL/hr at 02/08/21 9373  . piperacillin-tazobactam (ZOSYN)  IV 3.375 g (02/09/21 0525)     LOS: 4 days   Cherene Altes, MD [facility] Office  [phone] Pager - Text Page per Amion  If 7PM-7AM, please contact night-coverage per Amion 02/09/2021, 8:54 AM

## 2021-02-09 NOTE — Plan of Care (Signed)

## 2021-02-09 NOTE — Evaluation (Signed)
Physical Therapy Evaluation Patient Details Name: Kayla Harrison MRN: [id number] DOB: November 21, 1945 Today's Date: 02/09/2021   History of Present Illness  Pt is a 75 y/o female admitted 5/16 secondary to infected L thigh hematoma. Pt is s/p I And D on 5/17. Pt with admission in 3/22 following fall that resulted in L hematoma and was s/p evacuation on 12/19/20. PMH includes a fib, s/p pacemaker, non hodgkins lymphoma and HTN.  Clinical Impression  Pt admitted secondary to problem above with deficits below. Requiring mod A for bed mobility and min guard to stand and transfer to chair. Mild lightheadedness reported upon sitting, but improved with seated rest. Pt reports she was using rollator prior to admission. Reports she can have 24/7 support if needed at d/c. Was currently working with HHPT, so recommend resumption at d/c. Will continue to follow acutely.     Follow Up Recommendations Home health PT;Supervision/Assistance - 24 hour    Equipment Recommendations  Other (comment) (TBD pending progression)    Recommendations for Other Services       Precautions / Restrictions Precautions Precautions: Fall Precaution Comments: has hx of orthostatic hypotension Restrictions Weight Bearing Restrictions: Yes LLE Weight Bearing: Weight bearing as tolerated      Mobility  Bed Mobility Overal bed mobility: Needs Assistance Bed Mobility: Supine to Sit     Supine to sit: Mod assist     General bed mobility comments: Mod A for LLE and trunk assist to come to sitting. Mild lightheadedness reported    Transfers Overall transfer level: Needs assistance Equipment used: Rolling walker (2 wheeled) Transfers: Sit to/from [facility] Sit to Stand: Min guard Stand pivot transfers: Min guard       General transfer comment: Min guard for safety to stand and transfer to chair. Tech present as well for safety.  Ambulation/Gait                Stairs             Wheelchair Mobility    Modified Rankin (Stroke Patients Only)       Balance Overall balance assessment: Needs assistance Sitting-balance support: No upper extremity supported;Feet supported Sitting balance-Leahy Scale: Good     Standing balance support: Bilateral upper extremity supported Standing balance-Leahy Scale: Poor Standing balance comment: Reliant on UE support                             Pertinent Vitals/Pain Pain Assessment: Faces Faces Pain Scale: Hurts even more Pain Location: L thigh Pain Descriptors / Indicators: Aching;Operative site guarding Pain Intervention(s): Monitored during session;Limited activity within patient's tolerance;Repositioned    Home Living Family/patient expects to be discharged to:: Private residence Living Arrangements: Alone Available Help at Discharge: Available 24 hours/day Type of Home: Other(Comment) (condo) Home Access: Level entry     Home Layout: One level Home Equipment: Walker - 4 wheels;Shower seat - built in      Prior Function Level of Independence: Independent with assistive device(s)         Comments: HAs been using rollator for mobility     Hand Dominance        Extremity/Trunk Assessment   Upper Extremity Assessment Upper Extremity Assessment: Defer to OT evaluation    Lower Extremity Assessment Lower Extremity Assessment: LLE deficits/detail LLE Deficits / Details: Increased swelling in LLE. L thigh wrapped where incision is.    Cervical / Trunk Assessment Cervical / Trunk Assessment:  Normal  Communication   Communication: No difficulties  Cognition Arousal/Alertness: Awake/alert Behavior During Therapy: WFL for tasks assessed/performed Overall Cognitive Status: No family/caregiver present to determine baseline cognitive functioning                                        General Comments      Exercises     Assessment/Plan    PT Assessment Patient needs  continued PT services  PT Problem List Decreased strength;Decreased activity tolerance;Decreased balance;Decreased mobility;Decreased knowledge of use of DME;Pain       PT Treatment Interventions DME instruction;Gait training;Functional mobility training;Therapeutic activities;Therapeutic exercise;Balance training;Patient/family education    PT Goals (Current goals can be found in the Care Plan section)  Acute Rehab PT Goals Patient Stated Goal: to go home PT Goal Formulation: With patient Time For Goal Achievement: 02/23/21 Potential to Achieve Goals: Good    Frequency Min 3X/week   Barriers to discharge        Harrison-evaluation               AM-PAC PT "6 Clicks" Mobility  Outcome Measure Help needed turning from your back to your side while in a flat bed without using bedrails?: A Little Help needed moving from lying on your back to sitting on the side of a flat bed without using bedrails?: A Lot Help needed moving to and from a bed to a chair (including a wheelchair)?: A Little Help needed standing up from a chair using your arms (e.g., wheelchair or bedside chair)?: A Little Help needed to walk in hospital room?: A Little Help needed climbing 3-5 steps with a railing? : A Lot 6 Click Score: 16    End of Session   Activity Tolerance: Patient tolerated treatment well Patient left: in chair;with call bell/phone within reach;with nursing/sitter in room (tech present in room to help with bathing) Nurse Communication: Mobility status PT Visit Diagnosis: Unsteadiness on feet (R26.81);Muscle weakness (generalized) (M62.81)    Time: 2778-2423 PT Time Calculation (min) (ACUTE ONLY): 17 min   Charges:   PT Evaluation $PT Eval Moderate Complexity: 1 Mod          Reuel Derby, PT, DPT  Acute Rehabilitation Services  Pager: [phone] Office: [phone]   Kayla Harrison 02/09/2021, 11:31 AM

## 2021-02-09 NOTE — Progress Notes (Signed)
     Kayla Harrison is a 75 y.o. female   Orthopaedic diagnosis: Status post left thigh I&D with revision wound closure and debridement  Subjective: Patient is having some issues with the compressive wrap.  The proximal portion is irritating her skin.  She also has some swelling distal to the wrap.  Overall she feels well though.  Objectyive: Vitals:   02/08/21 2013 02/09/21 0526  BP: (!) 111/48 (!) 132/58  Pulse: 70 69  Resp: 18 16  Temp: 98.5 F (36.9 C) 98.1 F (36.7 C)  SpO2: 95% 95%     Exam: Awake and alert Respirations even and unlabored No acute distress  Left thigh with lymphedema distal to the compressive wrap.  The wrap and dressing was removed.  Wound appears to be healing well without significant drainage.  There is some abrasions to the proximal part of her thigh where the wrap was irritating the skin.  She has difficulty mobilizing leg due to swelling.   Assessment: Status post left thigh I&D and debridement of wound dehiscence.   Plan: The dressing was changed.  We will discontinue the compressive wrap as it is irritating her and causing lymphedema distally.  I am hopeful that her infection area does not does reaccumulate fluid and result in persistent infection.  We did discuss the possibility of this.  The drains continue to be working and output has been steady and therefore these will continue.  She has positive cultures with E. coli and Enterobacter.  She would be suitable for discharge once appropriate antibiotics have been chosen by the hospitalist team and her hemoglobin is stable.  I would plan for close follow-up in the hospital.  Patient would benefit from daily home health for dressing changes and wound check on discharge.  We will plan to leave the dressing in place unless shadowing occurs and there is concern for persistent wound drainage.  Will continue to follow while admitted.   Radene Journey, MD

## 2021-02-09 NOTE — Progress Notes (Signed)
Pt's skin redden and white on LUE where ace wrap placed.  Per Dr Lucia Gaskins, C is okay to loosen wrap.

## 2021-02-10 DIAGNOSIS — L089 Local infection of the skin and subcutaneous tissue, unspecified: Secondary | ICD-10-CM | POA: Diagnosis not present

## 2021-02-10 DIAGNOSIS — T148XXA Other injury of unspecified body region, initial encounter: Secondary | ICD-10-CM | POA: Diagnosis not present

## 2021-02-10 LAB — COMPREHENSIVE METABOLIC PANEL
ALT: 9 U/L (ref 0–44)
AST: 19 U/L (ref 15–41)
Albumin: 1.9 g/dL — ABNORMAL LOW (ref 3.5–5.0)
Alkaline Phosphatase: 55 U/L (ref 38–126)
Anion gap: 7 (ref 5–15)
BUN: 15 mg/dL (ref 8–23)
CO2: 23 mmol/L (ref 22–32)
Calcium: 7.7 mg/dL — ABNORMAL LOW (ref 8.9–10.3)
Chloride: 107 mmol/L (ref 98–111)
Creatinine, Ser: 0.93 mg/dL (ref 0.44–1.00)
GFR, Estimated: >60 mL/min (ref >60)
Glucose, Bld: 94 mg/dL (ref 70–99)
Potassium: 4.5 mmol/L (ref 3.5–5.1)
Sodium: 137 mmol/L (ref 135–145)
Total Bilirubin: 0.6 mg/dL (ref 0.3–1.2)
Total Protein: 4.2 g/dL — ABNORMAL LOW (ref 6.5–8.1)

## 2021-02-10 LAB — CBC
HCT: 26.9 % — ABNORMAL LOW (ref 36.0–46.0)
Hemoglobin: 8.3 g/dL — ABNORMAL LOW (ref 12.0–15.0)
MCH: 30.3 pg (ref 26.0–34.0)
MCHC: 30.9 g/dL (ref 30.0–36.0)
MCV: 98.2 fL (ref 80.0–100.0)
Platelets: 251 10*3/uL (ref 150–400)
RBC: 2.74 MIL/uL — ABNORMAL LOW (ref 3.87–5.11)
RDW: 17.4 % — ABNORMAL HIGH (ref 11.5–15.5)
WBC: 8.2 10*3/uL (ref 4.0–10.5)
nRBC: 0 % (ref 0.0–0.2)

## 2021-02-10 LAB — MAGNESIUM: Magnesium: 1.7 mg/dL (ref 1.7–2.4)

## 2021-02-10 MED ORDER — BISACODYL 10 MG RE SUPP
10.0000 mg | Freq: Every day | RECTAL | Status: DC | PRN
  Filled 2021-02-10: qty 1

## 2021-02-10 MED ORDER — LACTULOSE 10 GM/15ML PO SOLN: 30.0000 g | Freq: Every day | ORAL | Status: DC | PRN

## 2021-02-10 MED ORDER — MAGNESIUM CITRATE PO SOLN
1.0000 | Freq: Once | ORAL | Status: AC | PRN
  Administered 2021-02-10: 1 via ORAL
  Filled 2021-02-10: qty 296

## 2021-02-10 MED ORDER — POLYETHYLENE GLYCOL 3350 17 G PO PACK
17.0000 g | PACK | Freq: Two times a day (BID) | ORAL | Status: DC
  Filled 2021-02-10 (×4): qty 1

## 2021-02-10 NOTE — Plan of Care (Signed)

## 2021-02-10 NOTE — Progress Notes (Signed)
Kayla Harrison  ZJI:[id number] DOB: 1946-06-06 DOA: 02/05/2021 PCP: [name], MD    Brief Narrative:  [phone] with a history of hypothyroidism, Non-Hodgkin's lymphoma with SVC syndrome, atrial fibrillation on chronic Eliquis, status post pacemaker placement, obesity status post lap band surgery, HTN, and HLD who presented secondary to a recurrent hematoma/wound infection related to a prior I&D for a hematoma following a fall on anticoag.  Orthopedic surgery was consulted and ultimately took the patient to the OR for repeat I&D 5/17.  Significant Events: 3/29 evacuation of hematoma L hip 12/19/20 after fall on anticoag (orthostatic) 4/1 D/C to CIR  4/19 D/C from CIR  5/16 sent to ED by PCP for wound d/c   Consultants:  Orthopedics  Code Status: FULL CODE  Antimicrobials:  Unasyn 5/17  Vancomycin 5/17 Ancef 5/18 > Flagyl 5/18 >  DVT prophylaxis: SCDs  Subjective: No acute events overnight.  Afebrile with stable vitals.  Hemoglobin holding steady.  States she feels better today.  No new complaints.  Assessment & Plan:  Infected thigh wound hematoma - E coli + E faecalis Status post repeat I&D 5/17 - wound care per Orthopedics - abx adjusted again 5/19 based on cx data   Acute blood loss anemia - Normocytic anemia Due to above - hemoglobin trended down due to suspected ongoing bleeding into left hip region - transfused 1 unit 5/19 -follow-up hemoglobin stable today - no evidence of gross blood loss at present  Recent Labs  Lab 02/05/21 1333 02/06/21 0529 02/08/21 0331 02/09/21 0149 02/10/21 0124  HGB 11.2* 9.7* 7.2* 8.1* 8.3*     Chronic atrial fibrillation on Eliquis Rate controlled - Eliquis on hold due to above - continue amiodarone - followed by Dr. Caryl Comes - CHA2DS2-VASc is 6  Chronic profound orthostatic hypotension Continue usual midodrine and Florinef  Hyperlipidemia Continue usual Crestor  Hypothyroidism Continue usual Synthroid  Obesity - Body  mass index is 33.64 kg/m.  History of non-Hodgkin's lymphoma Currently in remission  Disposition Anticipate discharge home in next 24-48 hours   Family Communication: No family present at time of exam Status is: Inpatient  Remains inpatient appropriate because:Inpatient level of care appropriate due to severity of illness   Dispo: The patient is from: Home              Anticipated d/c is to: unclear               Patient currently is not medically stable to d/c.   Difficult to place patient No    Objective: Blood pressure 128/70, pulse 70, temperature 98.8 F (37.1 C), temperature source Oral, resp. rate 16, height 5\' 6"  (1.676 m), weight 94.5 kg, SpO2 94 %.  Intake/Output Summary (Last 24 hours) at 02/10/2021 0925 Last data filed at 02/10/2021 0602 Gross per 24 hour  Intake 240 ml  Output 780 ml  Net -540 ml   Filed Weights   02/06/21 0826  Weight: 94.5 kg    Examination: General: No acute respiratory distress Lungs: Clear to auscultation B without wheezing Cardiovascular: Regular rate without murmur Abdomen: Nontender, nondistended, soft, bowel sounds positive Extremities: Trace bilateral lower extremity edema without significant change  CBC: Recent Labs  Lab 02/05/21 1333 02/06/21 0529 02/08/21 0331 02/09/21 0149 02/10/21 0124  WBC 8.0   < > 11.3* 5.1 8.2  NEUTROABS 6.0  --   --   --   --   HGB 11.2*   < > 7.2* 8.1* 8.3*  HCT 37.6   < >  22.9* 25.5* 26.9*  MCV 101.3*   < > 98.7 96.2 98.2  PLT 253   < > 249 PLATELET CLUMPS NOTED ON SMEAR, UNABLE TO ESTIMATE 251   < > = values in this interval not displayed.   Basic Metabolic Panel: Recent Labs  Lab 02/06/21 0529 02/09/21 0149 02/10/21 0124  NA 136 138 137  K 4.4 4.6 4.5  CL 106 108 107  CO2 25 24 23   GLUCOSE 89 110* 94  BUN 16 22 15   CREATININE 0.93 1.13* 0.93  CALCIUM 8.1* 7.9* 7.7*  MG  --   --  1.7   GFR: Estimated Creatinine Clearance: 59.6 mL/min (by C-G formula based on SCr of 0.93  mg/dL).  Liver Function Tests: Recent Labs  Lab 02/05/21 1333 02/10/21 0124  AST 23 19  ALT 21 9  ALKPHOS 77 55  BILITOT 0.8 0.6  PROT 6.3* 4.2*  ALBUMIN 3.2* 1.9*    Coagulation Profile: Recent Labs  Lab 02/05/21 1333 02/09/21 0149  INR 1.6* 1.2    HbA1C: Hgb A1c MFr Bld  Date/Time Value Ref Range Status  12/04/2019 03:06 AM 5.5 4.8 - 5.6 % Final    Comment:    (NOTE) Pre diabetes:          5.7%-6.4% Diabetes:              >6.4% Glycemic control for   <7.0% adults with diabetes   02/04/2013 12:43 PM 5.9 4.6 - 6.5 % Final    Comment:    Glycemic Control Guidelines for People with Diabetes:Non Diabetic:  <6%Goal of Therapy: <7%Additional Action Suggested:  >8%       Recent Results (from the past 240 hour(s))  Blood culture (routine x 2)     Status: None (Preliminary result)   Collection Time: 02/05/21  2:58 PM   Specimen: BLOOD LEFT ARM  Result Value Ref Range Status   Specimen Description BLOOD LEFT ARM  Final   Special Requests   Final    BOTTLES DRAWN AEROBIC AND ANAEROBIC Blood Culture adequate volume   Culture   Final    NO GROWTH 4 DAYS Performed at [facility], [address] [place], [place] [address]    Report Status PENDING  Incomplete  Aerobic Culture w Gram Stain (superficial specimen)     Status: None   Collection Time: 02/05/21  3:38 PM   Specimen: Wound  Result Value Ref Range Status   Specimen Description WOUND  Final   Special Requests NONE  Final   Gram Stain   Final    ABUNDANT WBC PRESENT, PREDOMINANTLY PMN ABUNDANT GRAM NEGATIVE RODS MODERATE GRAM POSITIVE COCCI Performed at [facility], [place] [address] [place],  [address]    Culture   Final    MODERATE ESCHERICHIA COLI MODERATE ENTEROCOCCUS FAECALIS    Report Status 02/09/2021 FINAL  Final   Organism ID, Bacteria ESCHERICHIA COLI  Final   Organism ID, Bacteria ENTEROCOCCUS FAECALIS  Final      Susceptibility   Escherichia coli - MIC*    AMPICILLIN  >=32 RESISTANT Resistant     CEFAZOLIN <=4 SENSITIVE Sensitive     CEFEPIME <=0.12 SENSITIVE Sensitive     CEFTAZIDIME <=1 SENSITIVE Sensitive     CEFTRIAXONE <=0.25 SENSITIVE Sensitive     CIPROFLOXACIN <=0.25 SENSITIVE Sensitive     GENTAMICIN <=1 SENSITIVE Sensitive     IMIPENEM <=0.25 SENSITIVE Sensitive     TRIMETH/SULFA >=320 RESISTANT Resistant  AMPICILLIN/SULBACTAM 16 INTERMEDIATE Intermediate     PIP/TAZO <=4 SENSITIVE Sensitive     * MODERATE ESCHERICHIA COLI   Enterococcus faecalis - MIC*    AMPICILLIN <=2 SENSITIVE Sensitive     VANCOMYCIN 1 SENSITIVE Sensitive     GENTAMICIN SYNERGY RESISTANT Resistant     * MODERATE ENTEROCOCCUS FAECALIS  Resp Panel by RT-PCR (Flu A&B, Covid) Nasopharyngeal Swab     Status: None   Collection Time: 02/05/21  4:00 PM   Specimen: Nasopharyngeal Swab; Nasopharyngeal(NP) swabs in vial transport medium  Result Value Ref Range Status   SARS Coronavirus 2 by RT PCR NEGATIVE NEGATIVE Final    Comment: (NOTE) SARS-CoV-2 target nucleic acids are NOT DETECTED.  The SARS-CoV-2 RNA is generally detectable in upper respiratory specimens during the acute phase of infection. The lowest concentration of SARS-CoV-2 viral copies this assay can detect is 138 copies/mL. A negative result does not preclude SARS-Cov-2 infection and should not be used as the sole basis for treatment or other patient management decisions. A negative result may occur with  improper specimen collection/handling, submission of specimen other than nasopharyngeal swab, presence of viral mutation(s) within the areas targeted by this assay, and inadequate number of viral copies(<138 copies/mL). A negative result must be combined with clinical observations, patient history, and epidemiological information. The expected result is Negative.  Fact Sheet for Patients:  EntrepreneurPulse.com.au  Fact Sheet for Healthcare Providers:   IncredibleEmployment.be  This test is no t yet approved or cleared by the [place] FDA and  has been authorized for detection and/or diagnosis of SARS-CoV-2 by FDA under an Emergency Use Authorization (EUA). This EUA will remain  in effect (meaning this test can be used) for the duration of the COVID-19 declaration under Section 564(b)(1) of the Act, 21 U.S.C.section 360bbb-3(b)(1), unless the authorization is terminated  or revoked sooner.       Influenza A by PCR NEGATIVE NEGATIVE Final   Influenza B by PCR NEGATIVE NEGATIVE Final    Comment: (NOTE) The Xpert Xpress SARS-CoV-2/FLU/RSV plus assay is intended as an aid in the diagnosis of influenza from Nasopharyngeal swab specimens and should not be used as a sole basis for treatment. Nasal washings and aspirates are unacceptable for Xpert Xpress SARS-CoV-2/FLU/RSV testing.  Fact Sheet for Patients: EntrepreneurPulse.com.au  Fact Sheet for Healthcare Providers: IncredibleEmployment.be  This test is not yet approved or cleared by the [place] FDA and has been authorized for detection and/or diagnosis of SARS-CoV-2 by FDA under an Emergency Use Authorization (EUA). This EUA will remain in effect (meaning this test can be used) for the duration of the COVID-19 declaration under Section 564(b)(1) of the Act, 21 U.S.C. section 360bbb-3(b)(1), unless the authorization is terminated or revoked.  Performed at [facility], [place] [address], [place], [place] [address]   Surgical pcr screen     Status: None   Collection Time: 02/06/21  1:51 AM   Specimen: Nasal Mucosa; Nasal Swab  Result Value Ref Range Status   MRSA, PCR NEGATIVE NEGATIVE Final   Staphylococcus aureus NEGATIVE NEGATIVE Final    Comment: (NOTE) The Xpert SA Assay (FDA approved for NASAL specimens in patients 44 years of age and older), is one component of a comprehensive surveillance program.  It is not intended to diagnose infection nor to guide or monitor treatment. Performed at [facility], [place] [address], [place], [place] [address]   Culture, blood (Routine X 2) w Reflex to ID Panel  Status: None (Preliminary result)   Collection Time: 02/06/21  5:29 AM   Specimen: BLOOD RIGHT HAND  Result Value Ref Range Status   Specimen Description BLOOD RIGHT HAND  Final   Special Requests   Final    BOTTLES DRAWN AEROBIC AND ANAEROBIC Blood Culture adequate volume   Culture   Final    NO GROWTH 3 DAYS Performed at [facility], [address] [place], [place] [address]    Report Status PENDING  Incomplete  Aerobic/Anaerobic Culture w Gram Stain (surgical/deep wound)     Status: None (Preliminary result)   Collection Time: 02/06/21  9:47 AM   Specimen: PATH Other; Tissue  Result Value Ref Range Status   Specimen Description TISSUE  Final   Special Requests LEFT HIP ABSC SPEC A  Final   Gram Stain   Final    FEW WBC PRESENT,BOTH PMN AND MONONUCLEAR MODERATE GRAM POSITIVE COCCI RARE GRAM NEGATIVE RODS    Culture   Final    ABUNDANT ESCHERICHIA COLI ABUNDANT ENTEROCOCCUS FAECALIS CULTURE REINCUBATED FOR BETTER GROWTH BACTEROIDES THETAIOTAOMICRON BETA LACTAMASE POSITIVE Performed at [facility], [place] [address], [place], [place] [address]    Report Status PENDING  Incomplete   Organism ID, Bacteria ESCHERICHIA COLI  Final   Organism ID, Bacteria ENTEROCOCCUS FAECALIS  Final      Susceptibility   Escherichia coli - MIC*    AMPICILLIN >=32 RESISTANT Resistant     CEFAZOLIN <=4 SENSITIVE Sensitive     CEFEPIME <=0.12 SENSITIVE Sensitive     CEFTAZIDIME <=1 SENSITIVE Sensitive     CEFTRIAXONE <=0.25 SENSITIVE Sensitive     CIPROFLOXACIN <=0.25 SENSITIVE Sensitive     GENTAMICIN <=1 SENSITIVE Sensitive     IMIPENEM <=0.25 SENSITIVE Sensitive     TRIMETH/SULFA >=320 RESISTANT Resistant     AMPICILLIN/SULBACTAM 8 SENSITIVE Sensitive     PIP/TAZO <=4  SENSITIVE Sensitive     * ABUNDANT ESCHERICHIA COLI   Enterococcus faecalis - MIC*    AMPICILLIN <=2 SENSITIVE Sensitive     VANCOMYCIN 1 SENSITIVE Sensitive     GENTAMICIN SYNERGY RESISTANT Resistant     * ABUNDANT ENTEROCOCCUS FAECALIS     Scheduled Meds: . amiodarone  200 mg Oral Daily  . docusate sodium  100 mg Oral BID  . fludrocortisone  0.05 mg Oral BID WC  . levothyroxine  137 mcg Oral Once per day on Sun Mon Tue Wed Thu Fri  . levothyroxine  68.5 mcg Oral Once per day on Sat  . midodrine  5 mg Oral BID WC  . rosuvastatin  10 mg Oral Once per day on Mon Thu   Continuous Infusions: . piperacillin-tazobactam (ZOSYN)  IV 3.375 g (02/10/21 0602)     LOS: 5 days   Cherene Altes, MD [facility] Office  [phone] Pager - Text Page per Shea Evans  If 7PM-7AM, please contact night-coverage per Amion 02/10/2021, 9:25 AM

## 2021-02-10 NOTE — Progress Notes (Signed)
Physical Therapy Treatment Patient Details Name: Kayla Harrison MRN: [id number] DOB: 1946/06/30 Today's Date: 02/10/2021    History of Present Illness Pt is a 75 y/o female admitted 5/16 secondary to infected L thigh hematoma. Pt is s/p I And D on 5/17. Pt with admission in 3/22 following fall that resulted in L hematoma and was s/p evacuation on 12/19/20. PMH includes a fib, s/p pacemaker, non hodgkins lymphoma and HTN.    PT Comments    Pt up in chair on arrival to room and stated urgent need to vomit or have bowl movement. Assisted pt to [facility] where she also reported dizziness that would come and go, but improved with LE elevation. BP 128/68. Pt unable to have BM and stated she needed to return to bed for now. Further mobility deferred due to dizziness and nausea.   Follow Up Recommendations  Home health PT;Supervision/Assistance - 24 hour     Equipment Recommendations  Other (comment) (TBD pending progression)    Recommendations for Other Services       Precautions / Restrictions Precautions Precautions: Fall Precaution Comments: has hx of orthostatic hypotension Restrictions Weight Bearing Restrictions: No LLE Weight Bearing: Weight bearing as tolerated    Mobility  Bed Mobility Overal bed mobility: Needs Assistance Bed Mobility: Sit to Supine;Rolling Rolling: Modified independent (Device/Increase time) (with bed rails)     Sit to supine: Mod assist   General bed mobility comments: mod A to progress bil LEs onto bed.    Transfers Overall transfer level: Needs assistance Equipment used: Rolling walker (2 wheeled) Transfers: Sit to/from [facility] Sit to Stand: Min guard Stand pivot transfers: Min guard       General transfer comment: [place]ost min guard due to dizziness.  Ambulation/Gait             General Gait Details: deffered due to nausea and dizziness.   Stairs             Wheelchair Mobility    Modified Rankin (Stroke  Patients Only)       Balance Overall balance assessment: Needs assistance Sitting-balance support: No upper extremity supported;Feet supported Sitting balance-Leahy Scale: Good     Standing balance support: Bilateral upper extremity supported Standing balance-Leahy Scale: Poor Standing balance comment: Reliant on UE support                            Cognition Arousal/Alertness: Awake/alert Behavior During Therapy: WFL for tasks assessed/performed Overall Cognitive Status: Within Functional Limits for tasks assessed                                 General Comments: cue or exact date      Exercises      General Comments        Pertinent Vitals/Pain Pain Assessment: Faces Pain Score: 2  Faces Pain Scale: Hurts little more Pain Location: L thigh Pain Descriptors / Indicators: Dull;Sore;Operative site guarding Pain Intervention(s): Monitored during session;Limited activity within patient's tolerance;Repositioned    Home Living Family/patient expects to be discharged to:: Private residence Living Arrangements: Alone Available Help at Discharge: Available 24 hours/day;Friend(s) Type of Home: Other(Comment) (condo) Home Access: Level entry   Home Layout: One level Home Equipment: Walker - 4 wheels;Shower seat - built in;Grab bars - tub/shower;Grab bars - toilet;Cane - single point      Prior Function Level of Independence:  Independent with assistive device(s)      Comments: mod I with rollator   PT Goals (current goals can now be found in the care plan section) Acute Rehab PT Goals Patient Stated Goal: to go home PT Goal Formulation: With patient Time For Goal Achievement: 02/23/21 Potential to Achieve Goals: Good Progress towards PT goals: Progressing toward goals    Frequency    Min 3X/week      PT Plan Current plan remains appropriate    Co-evaluation              AM-PAC PT "6 Clicks" Mobility   Outcome Measure   Help needed turning from your back to your side while in a flat bed without using bedrails?: A Little Help needed moving from lying on your back to sitting on the side of a flat bed without using bedrails?: A Lot Help needed moving to and from a bed to a chair (including a wheelchair)?: A Little Help needed standing up from a chair using your arms (e.g., wheelchair or bedside chair)?: A Little Help needed to walk in hospital room?: A Little Help needed climbing 3-5 steps with a railing? : A Lot 6 Click Score: 16    End of Session   Activity Tolerance: Treatment limited secondary to medical complications (Comment) (nausea, fear of BM or vomiting) Patient left: with call bell/phone within reach;with nursing/sitter in room;in bed (tech present in room to help with bathing) Nurse Communication:  (nausea) PT Visit Diagnosis: Unsteadiness on feet (R26.81);Muscle weakness (generalized) (M62.81)     Time: 4142-3953 PT Time Calculation (min) (ACUTE ONLY): 33 min  Charges:  $Therapeutic Activity: 23-37 mins                    Benjiman Core, [place] Pager [phone] [place] 02/10/2021, 2:04 PM

## 2021-02-10 NOTE — Progress Notes (Signed)
Occupational Therapy Evaluation Patient Details Name: Kayla Harrison MRN: [id number] DOB: 11/27/1945 Today's Date: 02/10/2021    History of Present Illness Pt is a 75 y/o female admitted 5/16 secondary to infected L thigh hematoma. Pt is s/p I And D on 5/17. Pt with admission in 3/22 following fall that resulted in L hematoma and was s/p evacuation on 12/19/20. PMH includes a fib, s/p pacemaker, non hodgkins lymphoma and HTN.   Clinical Impression   PTA, pt was living alone in a 1st level condo with no STE. Pt was mod I with functional mobility, ADLs, ADL mobility, and IADLs using rollator for external support. Pt reports cooking for herself and recently hiring a cleaning service. Pt confirms having multiple friends that can provide up to 24/7 S/A as needed.  Today, pt received sitting upright in chair, pt agreeable to OT eval. Pt presents with edema/poor skin integrity (with drains in place) to L thigh. At rest, pt does not c/o much pain but with functional movement pt's L thigh pain increases. Currently, pt requires min guard for functional mobility/transfers using RW, mod assist-min guard for LB self-are, and setup-mod I for UB self-care. Pt participated in functional ADLs in bathroom but requested to sit down in chair halfway through 2/2 increased dizziness. Vitals stable. Pt benefits from increased verbal/visual cues for safe RW management. OT educated pt on safety awareness, activity pacing, PLB, optimal positioning for LLE, edema management, adaptive ADL strategies, d/c planning, and DME/AE. Pt reports having up to 24/7 S/A from friends, however need to confirm. OT will continue to follow pt acutely as able.    Follow Up Recommendations  Other (comment) (Anticipate progression to home with increased S/A and HHOT)    Equipment Recommendations  None recommended by OT    Recommendations for Other Services Other (comment) (None)     Precautions / Restrictions Precautions Precautions:  Fall Precaution Comments: has hx of orthostatic hypotension Restrictions Weight Bearing Restrictions: No LLE Weight Bearing: Weight bearing as tolerated      Mobility Bed Mobility Overal bed mobility: Needs Assistance Bed Mobility: Sit to Supine;Rolling Rolling: Modified independent (Device/Increase time) (with bed rails)     Sit to supine: Mod assist   General bed mobility comments: mod A to progress bil LEs onto bed.    Transfers Overall transfer level: Needs assistance Equipment used: Rolling walker (2 wheeled) Transfers: Sit to/from [facility] Sit to Stand: Min guard Stand pivot transfers: Min guard       General transfer comment: Clost min guard due to dizziness.    Balance Overall balance assessment: Needs assistance Sitting-balance support: No upper extremity supported;Feet supported Sitting balance-Leahy Scale: Good     Standing balance support: Bilateral upper extremity supported Standing balance-Leahy Scale: Poor Standing balance comment: Reliant on UE support       ADL either performed or assessed with clinical judgement   ADL Overall ADL's : Needs assistance/impaired Eating/Feeding: Independent;Sitting   Grooming: Wash/dry hands;Wash/dry face;Oral care;Sitting Grooming Details (indicate cue type and reason): attempted to groom at sink but became dizzy and asked to sit down in chair for grooming Upper Body Bathing: Set up;Modified independent;Sitting   Lower Body Bathing: Moderate assistance;Sitting/lateral leans;Sit to/from stand   Upper Body Dressing : Set up;Modified independent;Sitting   Lower Body Dressing: Moderate assistance;Sitting/lateral leans;Sit to/from stand   Toilet Transfer: Min guard;Cueing for sequencing;Cueing for safety;Ambulation;Comfort height toilet;Grab bars;RW Armed forces technical officer Details (indicate cue type and reason): cues for RW safety management Toileting- Clothing Manipulation and  Hygiene: Min guard;Cueing  for sequencing;Cueing for safety;Sitting/lateral lean;Sit to/from stand Toileting - Water quality scientist Details (indicate cue type and reason): for perineal care in standing after voiding in toilet Tub/ Shower Transfer:  (not assessed)   Functional mobility during ADLs: Min guard;Cueing for safety;Cueing for sequencing;Rolling walker General ADL Comments: cues for safe RW management and use of grab bars while participating in ADLs/ADL mobility     Vision Baseline Vision/History: Wears glasses Wears Glasses: Reading only Patient Visual Report: No change from baseline Vision Assessment?: No apparent visual deficits     Perception Perception Perception Tested?: No   Praxis Praxis Praxis tested?: Not tested    Pertinent Vitals/Pain Pain Assessment: Faces Pain Score: 2  Faces Pain Scale: Hurts little more Pain Location: L thigh Pain Descriptors / Indicators: Dull;Sore;Operative site guarding Pain Intervention(s): Monitored during session;Limited activity within patient's tolerance;Repositioned     Hand Dominance Right   Extremity/Trunk Assessment Upper Extremity Assessment Upper Extremity Assessment: Overall WFL for tasks assessed   Lower Extremity Assessment Lower Extremity Assessment: LLE deficits/detail LLE Deficits / Details: Increased swelling in LLE. L thigh wrapped where incision is. LLE Sensation: WNL LLE Coordination: decreased gross motor   Cervical / Trunk Assessment Cervical / Trunk Assessment: Normal   Communication Communication Communication: No difficulties   Cognition Arousal/Alertness: Awake/alert Behavior During Therapy: WFL for tasks assessed/performed Overall Cognitive Status: Within Functional Limits for tasks assessed     General Comments: cue or exact date   General Comments  edema and poor skin integrity noted to L thigh (near incision site), drains in place            Home Living Family/patient expects to be discharged to:: Private  residence Living Arrangements: Alone Available Help at Discharge: Available 24 hours/day;Friend(s) Type of Home: Other(Comment) (condo) Home Access: Level entry     Home Layout: One level     Bathroom Shower/Tub: Occupational psychologist: Handicapped height Bathroom Accessibility: Yes   Home Equipment: Environmental consultant - 4 wheels;Shower seat - built in;Grab bars - tub/shower;Grab bars - toilet;Cane - single point          Prior Functioning/Environment Level of Independence: Independent with assistive device(s)        Comments: mod I with rollator        OT Problem List: Decreased strength;Decreased range of motion;Decreased activity tolerance;Impaired balance (sitting and/or standing);Decreased safety awareness;Decreased knowledge of precautions;Pain;Increased edema      OT Treatment/Interventions: Self-care/ADL training;Therapeutic exercise;Neuromuscular education;Energy conservation;DME and/or AE instruction;Therapeutic activities;Patient/family education    OT Goals(Current goals can be found in the care plan section) Acute Rehab OT Goals Patient Stated Goal: to go home OT Goal Formulation: With patient Time For Goal Achievement: 02/24/21 Potential to Achieve Goals: Good  OT Frequency: Min 2X/week    AM-PAC OT "6 Clicks" Daily Activity     Outcome Measure Help from another person eating meals?: None Help from another person taking care of personal grooming?: A Little Help from another person toileting, which includes using toliet, bedpan, or urinal?: A Little Help from another person bathing (including washing, rinsing, drying)?: A Lot Help from another person to put on and taking off regular upper body clothing?: A Little Help from another person to put on and taking off regular lower body clothing?: A Lot 6 Click Score: 17   End of Session Equipment Utilized During Treatment: Gait belt;Rolling walker Nurse Communication: Mobility status  Activity Tolerance:  Patient tolerated treatment well;Patient limited by fatigue Patient left: in  chair;with call bell/phone within reach;with nursing/sitter in room  OT Visit Diagnosis: Unsteadiness on feet (R26.81);History of falling (Z91.81);Muscle weakness (generalized) (M62.81);Pain Pain - Right/Left: Left Pain - part of body: Leg                Time: 8657-8469 OT Time Calculation (min): 63 min Charges:  OT General Charges $OT Visit: 1 Visit OT Evaluation $OT Eval Moderate Complexity: 1 Mod OT Treatments $Self Care/Home Management : 38-52 mins  Michel Bickers, OTR/L [facility] [phone]  Francesca Jewett 02/10/2021, 2:13 PM

## 2021-02-11 DIAGNOSIS — T148XXA Other injury of unspecified body region, initial encounter: Secondary | ICD-10-CM | POA: Diagnosis not present

## 2021-02-11 DIAGNOSIS — L089 Local infection of the skin and subcutaneous tissue, unspecified: Secondary | ICD-10-CM | POA: Diagnosis not present

## 2021-02-11 LAB — CULTURE, BLOOD (ROUTINE X 2)
Culture: NO GROWTH
Culture: NO GROWTH
Special Requests: ADEQUATE
Special Requests: ADEQUATE

## 2021-02-11 LAB — CBC
HCT: 26.1 % — ABNORMAL LOW (ref 36.0–46.0)
Hemoglobin: 8.1 g/dL — ABNORMAL LOW (ref 12.0–15.0)
MCH: 29.8 pg (ref 26.0–34.0)
MCHC: 31 g/dL (ref 30.0–36.0)
MCV: 96 fL (ref 80.0–100.0)
Platelets: 255 10*3/uL (ref 150–400)
RBC: 2.72 MIL/uL — ABNORMAL LOW (ref 3.87–5.11)
RDW: 17 % — ABNORMAL HIGH (ref 11.5–15.5)
WBC: 8.9 10*3/uL (ref 4.0–10.5)
nRBC: 0 % (ref 0.0–0.2)

## 2021-02-11 MED ORDER — SODIUM CHLORIDE 0.9 % IV SOLN
3.0000 g | Freq: Four times a day (QID) | INTRAVENOUS | Status: DC
  Administered 2021-02-11 – 2021-02-12 (×6): 3 g via INTRAVENOUS
  Filled 2021-02-11: qty 8
  Filled 2021-02-11 (×3): qty 3
  Filled 2021-02-11: qty 8
  Filled 2021-02-11: qty 3
  Filled 2021-02-11 (×2): qty 8
  Filled 2021-02-11 (×2): qty 3
  Filled 2021-02-11: qty 8

## 2021-02-11 NOTE — Progress Notes (Signed)
Kayla Harrison  QIO:[id number] DOB: 1946-08-02 DOA: 02/05/2021 PCP: [name], MD    Brief Narrative:  [phone] with a history of hypothyroidism, Non-Hodgkin's lymphoma with SVC syndrome, atrial fibrillation on chronic Eliquis, status post pacemaker placement, obesity status post lap band surgery, HTN, and HLD who presented secondary to a recurrent hematoma/wound infection related to a prior I&D for a hematoma following a fall on anticoag.  Orthopedic surgery was consulted and ultimately took the patient to the OR for repeat I&D 5/17.  Significant Events: 3/29 evacuation of hematoma L hip 12/19/20 after fall on anticoag (orthostatic) 4/1 D/C to CIR  4/19 D/C from CIR  5/16 sent to ED by PCP for wound d/c   Consultants:  Orthopedics  Code Status: FULL CODE  Antimicrobials:  Unasyn 5/17  Vancomycin 5/17 Ancef 5/18 > Flagyl 5/18 >  DVT prophylaxis: SCDs  Subjective: Vital signs are stable.  Patient is afebrile.  Saturation 97% on room air.  Hemoglobin holding steady around 8.  Reports that she feels exceedingly weak and does not feel that she is safe to live independently at present.  Feels that she would likely benefit from a short-term rehab stay.  I agree with her.  Assessment & Plan:  Infected thigh wound hematoma - E coli + E faecalis Status post repeat I&D 5/17 - wound care per Orthopedics - abx adjusted again today based on new cx data -clinically progressing nicely  Acute blood loss anemia - Normocytic anemia Due to above - hemoglobin trended down due to suspected ongoing bleeding into left hip region - transfused 1 unit 5/19 -follow-up hemoglobin stable off anticoag - no evidence of gross blood loss at present - discussed at length w/ pt today the pros/cons of holding anticoag, and we both agree with holding anticoag until the wound heals further   Recent Labs  Lab 02/06/21 0529 02/08/21 0331 02/09/21 0149 02/10/21 0124 02/11/21 0154  HGB 9.7* 7.2* 8.1* 8.3*  8.1*     Chronic atrial fibrillation on Eliquis Rate controlled - Eliquis on hold due to above - continue amiodarone - followed by Dr. Caryl Comes - CHA2DS2-VASc is 6 - see discussion above regarding anticoag  Chronic profound orthostatic hypotension Continue usual midodrine and Florinef  Hyperlipidemia Continue usual Crestor  Hypothyroidism Continue usual Synthroid  Obesity - Body mass index is 33.64 kg/m.  History of non-Hodgkin's lymphoma Currently in remission    Family Communication: No family present at time of exam Status is: Inpatient  Remains inpatient appropriate because:Inpatient level of care appropriate due to severity of illness   Dispo: The patient is from: Home              Anticipated d/c is to: unclear               Patient currently is not medically stable to d/c.   Difficult to place patient No    Objective: Blood pressure 126/70, pulse 68, temperature 98 F (36.7 C), temperature source Oral, resp. rate 17, height 5\' 6"  (1.676 m), weight 94.5 kg, SpO2 97 %.  Intake/Output Summary (Last 24 hours) at 02/11/2021 0102 Last data filed at 02/11/2021 0559 Gross per 24 hour  Intake 340 ml  Output 868 ml  Net -528 ml   Filed Weights   02/06/21 0826  Weight: 94.5 kg    Examination: General: No acute respiratory distress Lungs: Clear to auscultation B - no wheezing  Cardiovascular: Regular rate without murmur Abdomen: Nontender, nondistended, soft, bowel sounds positive Extremities: 1+  L LE pedal edema - no signif R LE pedal edema   CBC: Recent Labs  Lab 02/05/21 1333 02/06/21 0529 02/09/21 0149 02/10/21 0124 02/11/21 0154  WBC 8.0   < > 5.1 8.2 8.9  NEUTROABS 6.0  --   --   --   --   HGB 11.2*   < > 8.1* 8.3* 8.1*  HCT 37.6   < > 25.5* 26.9* 26.1*  MCV 101.3*   < > 96.2 98.2 96.0  PLT 253   < > PLATELET CLUMPS NOTED ON SMEAR, UNABLE TO ESTIMATE 251 255   < > = values in this interval not displayed.   Basic Metabolic Panel: Recent Labs   Lab 02/06/21 0529 02/09/21 0149 02/10/21 0124  NA 136 138 137  K 4.4 4.6 4.5  CL 106 108 107  CO2 25 24 23   GLUCOSE 89 110* 94  BUN 16 22 15   CREATININE 0.93 1.13* 0.93  CALCIUM 8.1* 7.9* 7.7*  MG  --   --  1.7   GFR: Estimated Creatinine Clearance: 59.6 mL/min (by C-G formula based on SCr of 0.93 mg/dL).  Liver Function Tests: Recent Labs  Lab 02/05/21 1333 02/10/21 0124  AST 23 19  ALT 21 9  ALKPHOS 77 55  BILITOT 0.8 0.6  PROT 6.3* 4.2*  ALBUMIN 3.2* 1.9*    Coagulation Profile: Recent Labs  Lab 02/05/21 1333 02/09/21 0149  INR 1.6* 1.2    HbA1C: Hgb A1c MFr Bld  Date/Time Value Ref Range Status  12/04/2019 03:06 AM 5.5 4.8 - 5.6 % Final    Comment:    (NOTE) Pre diabetes:          5.7%-6.4% Diabetes:              >6.4% Glycemic control for   <7.0% adults with diabetes   02/04/2013 12:43 PM 5.9 4.6 - 6.5 % Final    Comment:    Glycemic Control Guidelines for People with Diabetes:Non Diabetic:  <6%Goal of Therapy: <7%Additional Action Suggested:  >8%       Recent Results (from the past 240 hour(s))  Blood culture (routine x 2)     Status: None (Preliminary result)   Collection Time: 02/05/21  2:58 PM   Specimen: BLOOD LEFT ARM  Result Value Ref Range Status   Specimen Description BLOOD LEFT ARM  Final   Special Requests   Final    BOTTLES DRAWN AEROBIC AND ANAEROBIC Blood Culture adequate volume   Culture   Final    NO GROWTH 4 DAYS Performed at [facility], [address], [place], [place] [address]    Report Status PENDING  Incomplete  Aerobic Culture w Gram Stain (superficial specimen)     Status: None   Collection Time: 02/05/21  3:38 PM   Specimen: Wound  Result Value Ref Range Status   Specimen Description WOUND  Final   Special Requests NONE  Final   Gram Stain   Final    ABUNDANT WBC PRESENT, PREDOMINANTLY PMN ABUNDANT GRAM NEGATIVE RODS MODERATE GRAM POSITIVE COCCI Performed at [facility], [place] [address],  [place], [place] [address]    Culture   Final    MODERATE ESCHERICHIA COLI MODERATE ENTEROCOCCUS FAECALIS    Report Status 02/09/2021 FINAL  Final   Organism ID, Bacteria ESCHERICHIA COLI  Final   Organism ID, Bacteria ENTEROCOCCUS FAECALIS  Final      Susceptibility   Escherichia coli - MIC*    AMPICILLIN >=  32 RESISTANT Resistant     CEFAZOLIN <=4 SENSITIVE Sensitive     CEFEPIME <=0.12 SENSITIVE Sensitive     CEFTAZIDIME <=1 SENSITIVE Sensitive     CEFTRIAXONE <=0.25 SENSITIVE Sensitive     CIPROFLOXACIN <=0.25 SENSITIVE Sensitive     GENTAMICIN <=1 SENSITIVE Sensitive     IMIPENEM <=0.25 SENSITIVE Sensitive     TRIMETH/SULFA >=320 RESISTANT Resistant     AMPICILLIN/SULBACTAM 16 INTERMEDIATE Intermediate     PIP/TAZO <=4 SENSITIVE Sensitive     * MODERATE ESCHERICHIA COLI   Enterococcus faecalis - MIC*    AMPICILLIN <=2 SENSITIVE Sensitive     VANCOMYCIN 1 SENSITIVE Sensitive     GENTAMICIN SYNERGY RESISTANT Resistant     * MODERATE ENTEROCOCCUS FAECALIS  Resp Panel by RT-PCR (Flu A&B, Covid) Nasopharyngeal Swab     Status: None   Collection Time: 02/05/21  4:00 PM   Specimen: Nasopharyngeal Swab; Nasopharyngeal(NP) swabs in vial transport medium  Result Value Ref Range Status   SARS Coronavirus 2 by RT PCR NEGATIVE NEGATIVE Final    Comment: (NOTE) SARS-CoV-2 target nucleic acids are NOT DETECTED.  The SARS-CoV-2 RNA is generally detectable in upper respiratory specimens during the acute phase of infection. The lowest concentration of SARS-CoV-2 viral copies this assay can detect is 138 copies/mL. A negative result does not preclude SARS-Cov-2 infection and should not be used as the sole basis for treatment or other patient management decisions. A negative result may occur with  improper specimen collection/handling, submission of specimen other than nasopharyngeal swab, presence of viral mutation(s) within the areas targeted by this assay, and inadequate number of  viral copies(<138 copies/mL). A negative result must be combined with clinical observations, patient history, and epidemiological information. The expected result is Negative.  Fact Sheet for Patients:  EntrepreneurPulse.com.au  Fact Sheet for Healthcare Providers:  IncredibleEmployment.be  This test is no t yet approved or cleared by the [place] FDA and  has been authorized for detection and/or diagnosis of SARS-CoV-2 by FDA under an Emergency Use Authorization (EUA). This EUA will remain  in effect (meaning this test can be used) for the duration of the COVID-19 declaration under Section 564(b)(1) of the Act, 21 U.S.C.section 360bbb-3(b)(1), unless the authorization is terminated  or revoked sooner.       Influenza A by PCR NEGATIVE NEGATIVE Final   Influenza B by PCR NEGATIVE NEGATIVE Final    Comment: (NOTE) The Xpert Xpress SARS-CoV-2/FLU/RSV plus assay is intended as an aid in the diagnosis of influenza from Nasopharyngeal swab specimens and should not be used as a sole basis for treatment. Nasal washings and aspirates are unacceptable for Xpert Xpress SARS-CoV-2/FLU/RSV testing.  Fact Sheet for Patients: EntrepreneurPulse.com.au  Fact Sheet for Healthcare Providers: IncredibleEmployment.be  This test is not yet approved or cleared by the [place] FDA and has been authorized for detection and/or diagnosis of SARS-CoV-2 by FDA under an Emergency Use Authorization (EUA). This EUA will remain in effect (meaning this test can be used) for the duration of the COVID-19 declaration under Section 564(b)(1) of the Act, 21 U.S.C. section 360bbb-3(b)(1), unless the authorization is terminated or revoked.  Performed at [facility], [place] [address], [place], [place] [address]   Surgical pcr screen     Status: None   Collection Time: 02/06/21  1:51 AM   Specimen: Nasal Mucosa; Nasal Swab   Result Value Ref Range Status   MRSA, PCR NEGATIVE NEGATIVE Final   Staphylococcus aureus NEGATIVE NEGATIVE Final  Comment: (NOTE) The Xpert SA Assay (FDA approved for NASAL specimens in patients 94 years of age and older), is one component of a comprehensive surveillance program. It is not intended to diagnose infection nor to guide or monitor treatment. Performed at [facility], [place] [address] [place], [place] [address]   Culture, blood (Routine X 2) w Reflex to ID Panel     Status: None   Collection Time: 02/06/21  5:29 AM   Specimen: BLOOD RIGHT HAND  Result Value Ref Range Status   Specimen Description BLOOD RIGHT HAND  Final   Special Requests   Final    BOTTLES DRAWN AEROBIC AND ANAEROBIC Blood Culture adequate volume   Culture   Final    NO GROWTH 5 DAYS Performed at [facility], [place] [address] [place], [place] [address]    Report Status 02/11/2021 FINAL  Final  Aerobic/Anaerobic Culture w Gram Stain (surgical/deep wound)     Status: None (Preliminary result)   Collection Time: 02/06/21  9:47 AM   Specimen: PATH Other; Tissue  Result Value Ref Range Status   Specimen Description TISSUE  Final   Special Requests LEFT HIP ABSC SPEC A  Final   Gram Stain   Final    FEW WBC PRESENT,BOTH PMN AND MONONUCLEAR MODERATE GRAM POSITIVE COCCI RARE GRAM NEGATIVE RODS    Culture   Final    ABUNDANT ESCHERICHIA COLI ABUNDANT ENTEROCOCCUS FAECALIS CULTURE REINCUBATED FOR BETTER GROWTH BACTEROIDES THETAIOTAOMICRON BETA LACTAMASE POSITIVE Performed at [facility], [place] [address] [place], [place] [address]    Report Status PENDING  Incomplete   Organism ID, Bacteria ESCHERICHIA COLI  Final   Organism ID, Bacteria ENTEROCOCCUS FAECALIS  Final      Susceptibility   Escherichia coli - MIC*    AMPICILLIN >=32 RESISTANT Resistant     CEFAZOLIN <=4 SENSITIVE Sensitive     CEFEPIME <=0.12 SENSITIVE Sensitive     CEFTAZIDIME <=1 SENSITIVE Sensitive      CEFTRIAXONE <=0.25 SENSITIVE Sensitive     CIPROFLOXACIN <=0.25 SENSITIVE Sensitive     GENTAMICIN <=1 SENSITIVE Sensitive     IMIPENEM <=0.25 SENSITIVE Sensitive     TRIMETH/SULFA >=320 RESISTANT Resistant     AMPICILLIN/SULBACTAM 8 SENSITIVE Sensitive     PIP/TAZO <=4 SENSITIVE Sensitive     * ABUNDANT ESCHERICHIA COLI   Enterococcus faecalis - MIC*    AMPICILLIN <=2 SENSITIVE Sensitive     VANCOMYCIN 1 SENSITIVE Sensitive     GENTAMICIN SYNERGY RESISTANT Resistant     * ABUNDANT ENTEROCOCCUS FAECALIS     Scheduled Meds: . amiodarone  200 mg Oral Daily  . docusate sodium  100 mg Oral BID  . fludrocortisone  0.05 mg Oral BID WC  . levothyroxine  137 mcg Oral Once per day on Sun Mon Tue Wed Thu Fri  . levothyroxine  68.5 mcg Oral Once per day on Sat  . midodrine  5 mg Oral BID WC  . polyethylene glycol  17 g Oral BID  . rosuvastatin  10 mg Oral Once per day on Mon Thu   Continuous Infusions: . piperacillin-tazobactam (ZOSYN)  IV 3.375 g (02/11/21 0548)     LOS: 6 days   Cherene Altes, MD [facility] Office  [phone] Pager - Text Page per Amion  If 7PM-7AM, please contact night-coverage per Amion 02/11/2021, 9:07 AM

## 2021-02-12 DIAGNOSIS — L089 Local infection of the skin and subcutaneous tissue, unspecified: Secondary | ICD-10-CM | POA: Diagnosis not present

## 2021-02-12 DIAGNOSIS — T148XXA Other injury of unspecified body region, initial encounter: Secondary | ICD-10-CM | POA: Diagnosis not present

## 2021-02-12 LAB — SEDIMENTATION RATE: Sed Rate: 45 mm/hr — ABNORMAL HIGH (ref 0–22)

## 2021-02-12 LAB — C-REACTIVE PROTEIN: CRP: 2.3 mg/dL — ABNORMAL HIGH (ref <1.0)

## 2021-02-12 NOTE — H&P (Signed)
Physical Medicine and Rehabilitation Admission H&P    Chief Complaint  Patient presents with  . Wound Infection  : HPI: Kayla Harrison is a 75 year old right-handed female with history of thyroid cancer, hypothyroidism, NHL/SVC syndrome /lung cancer with radiation therapy as well as chemotherapy 2018 followed by Dr. Lindi Adie, pacemaker placement for atrial fibrillation maintained on Eliquis followed by Dr. Caryl Comes, obesity status post lap band surgery, hypertension, hyperlipidemia, obesity with BMI 33.64, recurrent falls.  Recent mission CIR 12/22/2020 to 01/09/2021 for functional deficits related to left coronoid process fracture and left hip labral hamstring tendon tear with associated large hematoma requiring I&D and wound VAC.  She returned to the ER 01/10/2021 for open wound to left thigh that were bleeding from prior hematoma I&D site which was not actively bleeding and hemoglobin stable she was discharged back to home.  Per chart review patient lives alone.  Independent with assistive device.  1 level home with level entry.  She has good family support.  Presented 02/05/2021 with purulent malodorous drainage x1 week from left hip wound dehiscence.  Admission chemistries unremarkable aside glucose 103, INR 1.6, lactic acid 1.1, blood cultures no growth to date, hemoglobin 11.2, WBC 8000.  Patient underwent incision and drainage of left thigh deep abscess/hematoma excisional debridement of necrotic skin subcutaneous tissue muscle and fascia with revision of closure/drain placement 02/06/2021 per Dr. Melony Overly and wound VAC was placed.  Wound cultures after I&D E. coli plus E Faecalis and currently maintained on Unasyn.  It was noted to the central part of her wound was a bit dehisced no longer held tightly with suture material consideration being made for revision with washout.  Acute blood loss anemia 8.1 and monitored.  Sedimentation rate mildly elevated 45.  Anticoagulation with chronic  Eliquis for atrial fibrillation currently on hold and does continue on amiodarone at this time.  Close monitoring of blood pressure remaining on ProAmatine as well as Florinef.  Therapy evaluations completed due to patient's decreased functional mobility recommendations of physical medicine rehab consult.  Review of Systems  Constitutional: Positive for malaise/fatigue. Negative for chills and fever.  HENT: Negative for hearing loss.   Eyes: Negative for blurred vision and double vision.  Respiratory: Negative for cough and shortness of breath.   Cardiovascular: Positive for palpitations and leg swelling. Negative for chest pain.  Gastrointestinal: Positive for constipation. Negative for heartburn, nausea and vomiting.  Genitourinary: Negative for dysuria and hematuria.  Musculoskeletal: Positive for falls, joint pain and myalgias.  Skin: Negative for rash.  All other systems reviewed and are negative.  Past Medical History:  Diagnosis Date  . Anemia   . Arthritis    osteoarthritis - knees and right shoulder  . Blood transfusion without reported diagnosis   . Breast cancer ([place])    Dr Margot Chimes, total thyroidectomy- 1999- for cancer  . 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 Chronic bilateral pleural effusions   . Colon polyp    Dr Earlean Shawl  . Complication of anesthesia    Ketamine produces LSD reaction, bright colored nightmarish experience   . Dyslipidemia   . Endometriosis   . Fibroids   . H/O pleural effusion    s/p thoracentesis w 32107ml withdrawn  . Hepatitis    [place] ?hepatitis   . History of dysphagia    due to radiation therapy  . History of hiatal hernia    small noted on PET scan  . Hypertension   . Hypothyroidism   .  Lung cancer, lower lobe ([place]) 01/2017   radiation RX completed 03/04/17; will start chemo 6/27, pt unaware of lung cancer  . Morbid obesity ([place])    Status post lap band surgery  . Nephrolithiasis   . Non Hodgkin's lymphoma ([place])     on chemotherapy  . Persistent atrial fibrillation ([place])    a. s/p PVI 2008 b. s/p convergent ablation 6861 complicated by bradycardia requiring pacemaker implant  . Personal history of radiation therapy   . Presence of permanent cardiac pacemaker   . Rotator cuff tear    Right  . Stroke [facility])    2003- [place] x2  . SVC syndrome    with lung mass and non hodgkins lymphoma  . Thyroid cancer ([place]) 2000   Past Surgical History:  Procedure Laterality Date  . ABDOMINAL HYSTERECTOMY  1983  . afib ablation     a. 2008 PVI b. 2014 convergent ablation  . APPENDECTOMY    . BONE MARROW BIOPSY  02/21/2017  . BREAST LUMPECTOMY Left 2010  . bso  1998  . CARDIAC CATHETERIZATION     2015- negative  . CARDIOVERSION  10/09/2012   Procedure: CARDIOVERSION;  Surgeon: Minus Breeding, MD;  Location: [place];  Service: Cardiovascular;  Laterality: N/A;  . CARDIOVERSION  10/09/2012   Procedure: CARDIOVERSION;  Surgeon: Minus Breeding, MD;  Location: [facility] ENDOSCOPY;  Service: Cardiovascular;  Laterality: N/A;  [name] gave the ok to add pt to the add on , but we must check to find out if the can add pt on at [phone] ( [phone])  . CARDIOVERSION N/A 11/20/2012   Procedure: CARDIOVERSION;  Surgeon: Fay Records, MD;  Location: [place];  Service: Cardiovascular;  Laterality: N/A;  . CARDIOVERSION N/A 07/18/2017   Procedure: CARDIOVERSION;  Surgeon: Thayer Headings, MD;  Location: [facility] ENDOSCOPY;  Service: Cardiovascular;  Laterality: N/A;  . CARDIOVERSION N/A 10/03/2017   Procedure: CARDIOVERSION;  Surgeon: Sanda Klein, MD;  Location: [facility] ENDOSCOPY;  Service: Cardiovascular;  Laterality: N/A;  . CARDIOVERSION N/A 01/07/2018   Procedure: CARDIOVERSION;  Surgeon: Thayer Headings, MD;  Location: [facility] ENDOSCOPY;  Service: Cardiovascular;  Laterality: N/A;  . CARDIOVERSION N/A 12/10/2019   Procedure: CARDIOVERSION;  Surgeon: Buford Dresser, MD;  Location: [facility] ENDOSCOPY;  Service: Cardiovascular;  Laterality: N/A;  .  CHOLECYSTECTOMY    . COLONOSCOPY W/ POLYPECTOMY     Dr Earlean Shawl  . CYSTOSCOPY N/A 02/06/2015   Procedure: CYSTOSCOPY;  Surgeon: Kathie Rhodes, MD;  Location: [facility] ORS;  Service: Urology;  Laterality: N/A;  . CYSTOSCOPY W/ RETROGRADES Left 11/17/2017   Procedure: CYSTOSCOPY WITH RETROGRADE /PYELOGRAM/;  Surgeon: Kathie Rhodes, MD;  Location: [facility];  Service: Urology;  Laterality: Left;  . CYSTOSCOPY WITH RETROGRADE PYELOGRAM, URETEROSCOPY AND STENT PLACEMENT Right 02/06/2015   Procedure: RETROGRADE PYELOGRAM, RIGHT URETEROSCOPY STENT PLACEMENT;  Surgeon: Kathie Rhodes, MD;  Location: [facility];  Service: Urology;  Laterality: Right;  . CYSTOSCOPY WITH RETROGRADE PYELOGRAM, URETEROSCOPY AND STENT PLACEMENT Right 03/07/2017   Procedure: CYSTOSCOPY WITH RIGHT RETROGRADE PYELOGRAM,RIGHT  URETEROSCOPYLASER LITHOTRIPSY  AND STENT PLACEMENT AND STONE BASKETRY;  Surgeon: Kathie Rhodes, MD;  Location: [place];  Service: Urology;  Laterality: Right;  . EYE SURGERY     cataract surgery  . fatty mass removal  1999   pubic area  . HOLMIUM LASER APPLICATION N/A 6/83/7290   Procedure: HOLMIUM LASER APPLICATION;  Surgeon: Kathie Rhodes, MD;  Location: [facility];  Service: Urology;  Laterality: N/A;  . HOLMIUM LASER APPLICATION  Right 03/07/2017   Procedure: HOLMIUM LASER APPLICATION;  Surgeon: Kathie Rhodes, MD;  Location: [facility];  Service: Urology;  Laterality: Right;  . HOLMIUM LASER APPLICATION Left 9/75/3005   Procedure: HOLMIUM LASER APPLICATION;  Surgeon: Kathie Rhodes, MD;  [facility]: [facility];  Service: Urology;  Laterality: Left;  . I & D EXTREMITY Left 12/19/2020   Procedure: IRRIGATION AND DEBRIDEMENT OF LEFT HIP HEMATOMA WITH APPLICATION OF WOUND VAC;  Surgeon: Erle Crocker, MD;  Location: [place];  Service: Orthopedics;  Laterality: Left;  . I & D EXTREMITY Left 02/06/2021   Procedure: IRRIGATION AND DEBRIDEMENT DEEP ABCESS LEFT THIGH, SECONDARY CLOSURE OF WOUND DEHISCENCE;   Surgeon: Erle Crocker, MD;  Location: [place];  Service: Orthopedics;  Laterality: Left;  . IR FLUORO GUIDE PORT INSERTION RIGHT  02/24/2017  . IR NEPHROSTOMY PLACEMENT RIGHT  11/17/2017  . IR PATIENT EVAL TECH 0-60 MINS  03/11/2017  . IR REMOVAL TUN ACCESS W/ PORT W/O FL MOD SED  04/20/2018  . IR US GUIDE VASC ACCESS RIGHT  02/24/2017  . KNEE ARTHROSCOPY     bilateral  . LAPAROSCOPIC GASTRIC BANDING  07/10/2010  . LAPAROSCOPIC GASTRIC BANDING     Laparoscopic adjustable banding APS System with posterior hiatal hernia, 2 suture.  Marland Kitchen LAPAROTOMY     for ruptured ovary and ovarian artery   . NEPHROLITHOTOMY Right 11/17/2017   Procedure: NEPHROLITHOTOMY PERCUTANEOUS;  Surgeon: Kathie Rhodes, MD;  [facility];  Service: Urology;  Laterality: Right;  . PACEMAKER INSERTION  03/10/2013   MDT dual chamber PPM  . POCKET REVISION N/A 12/08/2013   Procedure: POCKET REVISION;  Surgeon: Deboraha Sprang, MD;  Location: [facility] CATH LAB;  Service: Cardiovascular;  Laterality: N/A;  . PORTA CATH INSERTION    . REVERSE SHOULDER ARTHROPLASTY Right 05/14/2018   Procedure: RIGHT REVERSE SHOULDER ARTHROPLASTY;  Surgeon: Tania Ade, MD;  Location: [place];  Service: Orthopedics;  Laterality: Right;  . REVERSE SHOULDER REPLACEMENT Right 05/14/2018  . RIGHT HEART CATH N/A 07/21/2019   Procedure: RIGHT HEART CATH;  Surgeon: Larey Dresser, MD;  Location: [place] CV LAB;  Service: Cardiovascular;  Laterality: N/A;  . TEE WITH CARDIOVERSION  09/22/2017  . TEE WITHOUT CARDIOVERSION N/A 10/03/2017   Procedure: TRANSESOPHAGEAL ECHOCARDIOGRAM (TEE);  Surgeon: Sanda Klein, MD;  Location: [facility] ENDOSCOPY;  Service: Cardiovascular;  Laterality: N/A;  . TEE WITHOUT CARDIOVERSION N/A 08/04/2019   Procedure: TRANSESOPHAGEAL ECHOCARDIOGRAM (TEE);  Surgeon: Larey Dresser, MD;  Location: [facility] ENDOSCOPY;  Service: Cardiovascular;  Laterality: N/A;  . TEE WITHOUT CARDIOVERSION N/A 12/10/2019   Procedure: TRANSESOPHAGEAL  ECHOCARDIOGRAM (TEE);  Surgeon: Buford Dresser, MD;  Location: [facility] ENDOSCOPY;  Service: Cardiovascular;  Laterality: N/A;  . THYROIDECTOMY  1998   Dr Margot Chimes  . TONSILLECTOMY    . TOTAL KNEE ARTHROPLASTY  04/13/2012   Procedure: TOTAL KNEE ARTHROPLASTY;  Surgeon: Rudean Haskell, MD;  Location: [place];  Service: Orthopedics;  Laterality: Right;  Marland Kitchen VIDEO BRONCHOSCOPY WITH ENDOBRONCHIAL ULTRASOUND N/A 02/07/2017   Procedure: VIDEO BRONCHOSCOPY WITH ENDOBRONCHIAL ULTRASOUND;  Surgeon: Marshell Garfinkel, MD;  Location: [place];  Service: Pulmonary;  Laterality: N/A;   Family History  Problem Relation Age of Onset  . Heart disease Father 50       MI $R'@autopsy'cQ$   . Colon cancer Father        COLON  . Heart attack Father   . Other Mother        temporal arteritis   .  Diabetes Sister   . Diabetes Brother   . Diabetes Paternal Aunt   . Diabetes Paternal Grandmother   . Esophageal cancer Neg Hx   . Inflammatory bowel disease Neg Hx   . Liver disease Neg Hx   . Pancreatic cancer Neg Hx   . Stomach cancer Neg Hx    Social History:  reports that she has never smoked. She has never used smokeless tobacco. She reports that she does not drink alcohol and does not use drugs. Allergies:  Allergies  Allergen Reactions  . Dofetilide Other (See Comments)    Prolonged QT interval   . Ranolazine     Balance issues  . Rivaroxaban Other (See Comments)    Nose Bleed X 6 hrs ; packing in ER Nasal hemorrhage  . Rivaroxaban     Nose Bleed X 6 hrs ; packing in ER Nasal hemorrhage  . Tape Other (See Comments)    SKIN IS THIN AND TEARS EASILY   . Tikosyn [Dofetilide] Other (See Comments)    "Long QT wave"   . Epinephrine Other (See Comments)    Oral anesthetic Dental form only (liquid). Patient stated she will become "out of it" she can hear you but cannot respond in normal fashion. "not fully with it"  . Epinephrine   . Ketamine Other (See Comments)    Hallucinations   . Adhesive [Tape] Rash     Paper tape as well  . Ketamine Other (See Comments)    HALLUCINATIONS   Medications Prior to Admission  Medication Sig Dispense Refill  . acetaminophen (TYLENOL) 325 MG tablet Take 2 tablets (650 mg total) by mouth every 4 (four) hours as needed for mild pain.    Marland Kitchen amiodarone (PACERONE) 200 MG tablet Take 1 tablet (200 mg total) by mouth daily. 90 tablet 3  . ELIQUIS 5 MG TABS tablet TAKE 1 TABLET BY MOUTH  TWICE DAILY (Patient taking differently: Take 5 mg by mouth 2 (two) times daily.) 180 tablet 2  . ferrous sulfate 325 (65 FE) MG tablet Take 1 tablet (325 mg total) by mouth daily with breakfast. 30 tablet 3  . fludrocortisone (FLORINEF) 0.1 MG tablet Take 0.5 tablets (0.05 mg total) by mouth 2 (two) times daily with a meal. 30 tablet 0  . furosemide (LASIX) 20 MG tablet Take 20 mg by mouth daily as needed for fluid or edema.     Marland Kitchen levothyroxine (SYNTHROID) 137 MCG tablet Take 68.5-137 mcg by mouth See admin instructions. Taking 137 mcg tablet daily except on Saturday taking 1/2 tablet    . Lifitegrast 5 % SOLN Place 1 drop into both eyes at bedtime.     . midodrine (PROAMATINE) 5 MG tablet Take 1 tablet (5 mg total) by mouth 2 (two) times daily with a meal. 60 tablet 0  . nitrofurantoin, macrocrystal-monohydrate, (MACROBID) 100 MG capsule Take 100 mg by mouth 2 (two) times daily.    . rosuvastatin (CRESTOR) 10 MG tablet Take 10 mg by mouth 2 (two) times a week. Takes on Monday and Thursday      Drug Regimen Review Drug regimen was reviewed and remains appropriate with no significant issues identified  Home: Home Living Family/patient expects to be discharged to:: Private residence Living Arrangements: Alone Available Help at Discharge: Available 24 hours/day,Friend(s) Type of Home: Other(Comment) (condo) Home Access: Level entry Home Layout: One level Bathroom Shower/Tub: Multimedia programmer: Handicapped height Bathroom Accessibility: Yes Home Equipment: Environmental consultant - 4  wheels,Shower seat - built  in,Grab bars - tub/shower,Grab bars - toilet,Cane - single point   Functional History: Prior Function Level of Independence: Independent with assistive device(s) Comments: mod I with rollator  Functional Status:  Mobility: Bed Mobility Overal bed mobility: Needs Assistance Bed Mobility: Sit to Supine Rolling: Modified independent (Device/Increase time) (with bed rails) Supine to sit: Mod assist Sit to supine: Min assist General bed mobility comments: minA for L LE management with bed in lowest position, pt reports her bed at home is really high and she needs a step to get into it Transfers Overall transfer level: Needs assistance Equipment used: Rolling walker (2 wheeled) Transfers: Sit to/from Stand Sit to Stand: Min guard Stand pivot transfers: Min guard General transfer comment: close min guard, verbal cues for walker management, pt reports "my legs will give out on me if i'm not careful" Ambulation/Gait Ambulation/Gait assistance: Min assist Gait Distance (Feet): 50 Feet (x2) Assistive device: Rolling walker (2 wheeled) Gait Pattern/deviations: Step-through pattern,Decreased stride length,Trunk flexed General Gait Details: pt very dependent on RW, verbal cues for walker management, pt requiring seated rest break Gait velocity: slow Gait velocity interpretation: <1.31 ft/sec, indicative of household ambulator    ADL: ADL Overall ADL's : Needs assistance/impaired Eating/Feeding: Independent,Sitting Grooming: Wash/dry hands,Wash/dry face,Oral care,Sitting Grooming Details (indicate cue type and reason): attempted to groom at sink but became dizzy and asked to sit down in chair for grooming Upper Body Bathing: Set up,Modified independent,Sitting Lower Body Bathing: Moderate assistance,Sitting/lateral leans,Sit to/from stand Upper Body Dressing : Set up,Modified independent,Sitting Lower Body Dressing: Moderate assistance,Sitting/lateral leans,Sit  to/from stand Toilet Transfer: Min guard,Cueing for sequencing,Cueing for safety,Ambulation,Comfort height toilet,Grab bars,RW Armed forces technical officer Details (indicate cue type and reason): cues for RW safety management Toileting- Clothing Manipulation and Hygiene: Min guard,Cueing for sequencing,Cueing for safety,Sitting/lateral lean,Sit to/from stand Toileting - Water quality scientist Details (indicate cue type and reason): for perineal care in standing after voiding in toilet Tub/ Shower Transfer:  (not assessed) Functional mobility during ADLs: Min guard,Cueing for safety,Cueing for sequencing,Rolling walker General ADL Comments: cues for safe RW management and use of grab bars while participating in ADLs/ADL mobility  Cognition: Cognition Overall Cognitive Status: Within Functional Limits for tasks assessed Orientation Level: Oriented X4 Cognition Arousal/Alertness: Awake/alert Behavior During Therapy: Anxious Overall Cognitive Status: Within Functional Limits for tasks assessed General Comments: anxious regarding going home due to  "i'm so weak"  Physical Exam: Blood pressure (!) 150/74, pulse 73, temperature 98.7 F (37.1 C), temperature source Oral, resp. rate 18, height $RemoveBe'5\' 6"'sVrujwNPr$  (1.676 m), weight 94.5 kg, SpO2 96 %. Physical Exam Constitutional:      General: She is not in acute distress.    Appearance: She is obese.  HENT:     Head: Normocephalic and atraumatic.     Nose: Nose normal.     Mouth/Throat:     Mouth: Mucous membranes are moist.  Eyes:     Conjunctiva/sclera: Conjunctivae normal.     Pupils: Pupils are equal, round, and reactive to light.  Cardiovascular:     Rate and Rhythm: Tachycardia present. Rhythm irregular.     Pulses: Normal pulses.     Comments: Irregularly Irregular Pulmonary:     Effort: Pulmonary effort is normal.  Abdominal:     General: There is no distension.     Palpations: Abdomen is soft.  Musculoskeletal:     Cervical back: Normal range of  motion.  Skin:    Comments: Left leg with Vac and serosanguinous drainage. Also with 2 JP drains with  the same color fluid. Left leg actually smaller in size compared to when I last saw her.   Neurological:     Mental Status: She is alert.     Comments: Alert and oriented x 3. Normal insight and awareness. Intact Memory. Normal language and speech. Cranial nerve exam unremarkable. UE motor 5/5. LLE 3/5 prox to 4/5 distally. RLE 4-/5 prox to 4/5 distally. No sensory findings appreciated. Normal muscle tone  Psychiatric:        Mood and Affect: Mood normal.        Behavior: Behavior normal.        Thought Content: Thought content normal.        Judgment: Judgment normal.     Results for orders placed or performed during the hospital encounter of 02/05/21 (from the past 48 hour(s))  CBC     Status: Abnormal   Collection Time: 02/11/21  1:54 AM  Result Value Ref Range   WBC 8.9 4.0 - 10.5 K/uL   RBC 2.72 (L) 3.87 - 5.11 MIL/uL   Hemoglobin 8.1 (L) 12.0 - 15.0 g/dL   HCT 26.1 (L) 36.0 - 46.0 %   MCV 96.0 80.0 - 100.0 fL   MCH 29.8 26.0 - 34.0 pg   MCHC 31.0 30.0 - 36.0 g/dL   RDW 17.0 (H) 11.5 - 15.5 %   Platelets 255 150 - 400 K/uL   nRBC 0.0 0.0 - 0.2 %    Comment: Performed at [facility], [address], [place], [place] [address]  Sedimentation rate     Status: Abnormal   Collection Time: 02/12/21  9:59 AM  Result Value Ref Range   Sed Rate 45 (H) 0 - 22 mm/hr    Comment: Performed at [facility] [address], [place], [place] [address]  C-reactive protein     Status: Abnormal   Collection Time: 02/12/21  9:59 AM  Result Value Ref Range   CRP 2.3 (H) <1.0 mg/dL    Comment: Performed at [facility] [address] [place], [place] [address]   *Note: Due to a large number of results and/or encounters for the requested time period, some results have not been displayed. A complete set of results can be found in Results Review.   No results  found.     Medical Problem List and Plan: 1.  Debility secondary to infected thigh wound hematoma.  Status post I&D 02/06/2021 with a wound VAC and drain placement  -patient may not yet shower  -ELOS/Goals: 7-10 days, supervision to mod I goals with PT and OT 2.  Antithrombotics: -DVT/anticoagulation: SCDs.  Chronic anticoagulation currently on hold  -antiplatelet therapy: N/A 3. Pain Management: Hydrocodone as needed 4. Mood: Provide emotional support  -antipsychotic agents: N/A 5. Neuropsych: This patient is capable of making decisions on her own behalf. 6. Skin/Wound Care: Routine skin checks 7. Fluids/Electrolytes/Nutrition: Routine in and outs with follow-up chemistries 8.  Acute blood loss anemia.  Follow-up CBC 9.  Atrial fibrillation/pacemaker.  Amiodarone 200 mg daily.  Cardiac rate is some what controlled.  Eliquis on hold due to hematoma  -pt is going in and out of a-fib on exam today. Activity tolerance is poor when HR is elevated and irregular.    -f/u with cardiology as necessary here 10.  Orthostasis.  Florinef 0.05 mg twice daily, ProAmatine 5 mg twice daily.  Monitor with increased mobility 11.  ID.  E. Coli/E Faecalis.  Currently remains on Unasyn.  Will need to define duration of antibiotic.  -ESR, CRP to monitor status of infection  -ID input? 12.  Hypothyroidism.  Synthroid 13.  NHL with SVC syndrome/lung cancer with radiation therapy as well as chemo.  Follow-up Dr. Lindi Adie 14.  Obesity.  Status post lap band surgery.  BMI 33.64.  Dietary follow-up 15.  Hyperlipidemia.  Crestor    Lavon Paganini Angiulli, PA-C 02/12/2021

## 2021-02-12 NOTE — Progress Notes (Signed)
Kayla Harrison  XKG:[id number] DOB: January 02, 1946 DOA: 02/05/2021 PCP: [name], MD    Brief Narrative:  [phone] with a history of hypothyroidism, Non-Hodgkin's lymphoma with SVC syndrome, atrial fibrillation on chronic Eliquis, status post pacemaker placement, obesity status post lap band surgery, HTN, and HLD who presented secondary to a recurrent hematoma/wound infection related to a prior I&D for a hematoma following a fall on anticoag.  Orthopedic surgery was consulted and ultimately took the patient to the OR for repeat I&D 5/17.  Significant Events: 3/29 evacuation of hematoma L hip 12/19/20 after fall on anticoag (orthostatic) 4/1 D/C to CIR  4/19 D/C from CIR  5/16 sent to ED by PCP for wound d/c   Consultants:  Orthopedics  Code Status: FULL CODE  Antimicrobials:  Unasyn 5/17  Vancomycin 5/17 Ancef 5/18 > Flagyl 5/18 >  DVT prophylaxis: SCDs  Subjective: Afebrile.  Vital signs stable.  No new complaints today.  Tells me she is highly motivated to participate in CIR.  Assessment & Plan:  Infected thigh wound hematoma - E coli + E faecalis Status post repeat I&D 5/17 - wound care per Orthopedics - abx adjusted again 5/22 based on new cx data -clinically progressing nicely -plan for superficial wound VAC to be placed by orthopedics  Acute blood loss anemia - Normocytic anemia Due to above - hemoglobin trended down due to suspected ongoing bleeding into left hip region - transfused 1 unit 5/19 -follow-up hemoglobin stable off anticoag - no evidence of gross blood loss at present - discussed at length w/ pt the pros/cons of holding anticoag, and we both agree with holding anticoag until the wound heals further   Recent Labs  Lab 02/06/21 0529 02/08/21 0331 02/09/21 0149 02/10/21 0124 02/11/21 0154  HGB 9.7* 7.2* 8.1* 8.3* 8.1*     Chronic atrial fibrillation on Eliquis Rate controlled - Eliquis on hold due to above - continue amiodarone - followed by Dr.  Caryl Comes - CHA2DS2-VASc is 6 - see discussion above regarding anticoag  Chronic profound orthostatic hypotension Continue usual midodrine and Florinef  Hyperlipidemia Continue usual Crestor  Hypothyroidism Continue usual Synthroid  Obesity - Body mass index is 33.64 kg/m.  History of non-Hodgkin's lymphoma Currently in remission    Family Communication: No family present at time of exam Status is: Inpatient  Remains inpatient appropriate because:Inpatient level of care appropriate due to severity of illness   Dispo: The patient is from: Home              Anticipated d/c is to: unclear               Patient currently is not medically stable to d/c.   Difficult to place patient No    Objective: Blood pressure (!) 150/74, pulse 73, temperature 98.7 F (37.1 C), temperature source Oral, resp. rate 18, height 5\' 6"  (1.676 m), weight 94.5 kg, SpO2 96 %.  Intake/Output Summary (Last 24 hours) at 02/12/2021 0855 Last data filed at 02/12/2021 3785 Gross per 24 hour  Intake 500 ml  Output 10 ml  Net 490 ml   Filed Weights   02/06/21 0826  Weight: 94.5 kg    Examination: General: No acute respiratory distress Lungs: Clear to auscultation B   Cardiovascular: Regular rate without murmur Abdomen: Nontender, nondistended, soft, bowel sounds positive Extremities: 1+ L LE pedal edema w/ no signif R LE pedal edema   CBC: Recent Labs  Lab 02/05/21 1333 02/06/21 0529 02/09/21 0149 02/10/21 0124 02/11/21 0154  WBC 8.0   < > 5.1 8.2 8.9  NEUTROABS 6.0  --   --   --   --   HGB 11.2*   < > 8.1* 8.3* 8.1*  HCT 37.6   < > 25.5* 26.9* 26.1*  MCV 101.3*   < > 96.2 98.2 96.0  PLT 253   < > PLATELET CLUMPS NOTED ON SMEAR, UNABLE TO ESTIMATE 251 255   < > = values in this interval not displayed.   Basic Metabolic Panel: Recent Labs  Lab 02/06/21 0529 02/09/21 0149 02/10/21 0124  NA 136 138 137  K 4.4 4.6 4.5  CL 106 108 107  CO2 25 24 23   GLUCOSE 89 110* 94  BUN 16 22 15    CREATININE 0.93 1.13* 0.93  CALCIUM 8.1* 7.9* 7.7*  MG  --   --  1.7   GFR: Estimated Creatinine Clearance: 59.6 mL/min (by C-G formula based on SCr of 0.93 mg/dL).  Liver Function Tests: Recent Labs  Lab 02/05/21 1333 02/10/21 0124  AST 23 19  ALT 21 9  ALKPHOS 77 55  BILITOT 0.8 0.6  PROT 6.3* 4.2*  ALBUMIN 3.2* 1.9*    Coagulation Profile: Recent Labs  Lab 02/05/21 1333 02/09/21 0149  INR 1.6* 1.2    HbA1C: Hgb A1c MFr Bld  Date/Time Value Ref Range Status  12/04/2019 03:06 AM 5.5 4.8 - 5.6 % Final    Comment:    (NOTE) Pre diabetes:          5.7%-6.4% Diabetes:              >6.4% Glycemic control for   <7.0% adults with diabetes   02/04/2013 12:43 PM 5.9 4.6 - 6.5 % Final    Comment:    Glycemic Control Guidelines for People with Diabetes:Non Diabetic:  <6%Goal of Therapy: <7%Additional Action Suggested:  >8%       Recent Results (from the past 240 hour(s))  Blood culture (routine x 2)     Status: None   Collection Time: 02/05/21  2:58 PM   Specimen: BLOOD LEFT ARM  Result Value Ref Range Status   Specimen Description BLOOD LEFT ARM  Final   Special Requests   Final    BOTTLES DRAWN AEROBIC AND ANAEROBIC Blood Culture adequate volume   Culture   Final    NO GROWTH 6 DAYS Performed at [facility], [address] [place], [place] [address]    Report Status 02/11/2021 FINAL  Final  Aerobic Culture w Gram Stain (superficial specimen)     Status: None   Collection Time: 02/05/21  3:38 PM   Specimen: Wound  Result Value Ref Range Status   Specimen Description WOUND  Final   Special Requests NONE  Final   Gram Stain   Final    ABUNDANT WBC PRESENT, PREDOMINANTLY PMN ABUNDANT GRAM NEGATIVE RODS MODERATE GRAM POSITIVE COCCI Performed at [facility], [place] [address], [place], [place] [address]    Culture   Final    MODERATE ESCHERICHIA COLI MODERATE ENTEROCOCCUS FAECALIS    Report Status 02/09/2021 FINAL  Final   Organism ID,  Bacteria ESCHERICHIA COLI  Final   Organism ID, Bacteria ENTEROCOCCUS FAECALIS  Final      Susceptibility   Escherichia coli - MIC*    AMPICILLIN >=32 RESISTANT Resistant     CEFAZOLIN <=4 SENSITIVE Sensitive     CEFEPIME <=0.12 SENSITIVE Sensitive     CEFTAZIDIME <=1 SENSITIVE Sensitive  CEFTRIAXONE <=0.25 SENSITIVE Sensitive     CIPROFLOXACIN <=0.25 SENSITIVE Sensitive     GENTAMICIN <=1 SENSITIVE Sensitive     IMIPENEM <=0.25 SENSITIVE Sensitive     TRIMETH/SULFA >=320 RESISTANT Resistant     AMPICILLIN/SULBACTAM 16 INTERMEDIATE Intermediate     PIP/TAZO <=4 SENSITIVE Sensitive     * MODERATE ESCHERICHIA COLI   Enterococcus faecalis - MIC*    AMPICILLIN <=2 SENSITIVE Sensitive     VANCOMYCIN 1 SENSITIVE Sensitive     GENTAMICIN SYNERGY RESISTANT Resistant     * MODERATE ENTEROCOCCUS FAECALIS  Resp Panel by RT-PCR (Flu A&B, Covid) Nasopharyngeal Swab     Status: None   Collection Time: 02/05/21  4:00 PM   Specimen: Nasopharyngeal Swab; Nasopharyngeal(NP) swabs in vial transport medium  Result Value Ref Range Status   SARS Coronavirus 2 by RT PCR NEGATIVE NEGATIVE Final    Comment: (NOTE) SARS-CoV-2 target nucleic acids are NOT DETECTED.  The SARS-CoV-2 RNA is generally detectable in upper respiratory specimens during the acute phase of infection. The lowest concentration of SARS-CoV-2 viral copies this assay can detect is 138 copies/mL. A negative result does not preclude SARS-Cov-2 infection and should not be used as the sole basis for treatment or other patient management decisions. A negative result may occur with  improper specimen collection/handling, submission of specimen other than nasopharyngeal swab, presence of viral mutation(s) within the areas targeted by this assay, and inadequate number of viral copies(<138 copies/mL). A negative result must be combined with clinical observations, patient history, and epidemiological information. The expected result is  Negative.  Fact Sheet for Patients:  EntrepreneurPulse.com.au  Fact Sheet for Healthcare Providers:  IncredibleEmployment.be  This test is no t yet approved or cleared by the [place] FDA and  has been authorized for detection and/or diagnosis of SARS-CoV-2 by FDA under an Emergency Use Authorization (EUA). This EUA will remain  in effect (meaning this test can be used) for the duration of the COVID-19 declaration under Section 564(b)(1) of the Act, 21 U.S.C.section 360bbb-3(b)(1), unless the authorization is terminated  or revoked sooner.       Influenza A by PCR NEGATIVE NEGATIVE Final   Influenza B by PCR NEGATIVE NEGATIVE Final    Comment: (NOTE) The Xpert Xpress SARS-CoV-2/FLU/RSV plus assay is intended as an aid in the diagnosis of influenza from Nasopharyngeal swab specimens and should not be used as a sole basis for treatment. Nasal washings and aspirates are unacceptable for Xpert Xpress SARS-CoV-2/FLU/RSV testing.  Fact Sheet for Patients: EntrepreneurPulse.com.au  Fact Sheet for Healthcare Providers: IncredibleEmployment.be  This test is not yet approved or cleared by the [place] FDA and has been authorized for detection and/or diagnosis of SARS-CoV-2 by FDA under an Emergency Use Authorization (EUA). This EUA will remain in effect (meaning this test can be used) for the duration of the COVID-19 declaration under Section 564(b)(1) of the Act, 21 U.S.C. section 360bbb-3(b)(1), unless the authorization is terminated or revoked.  Performed at [facility], [place] [address], [place], [place] [address]   Surgical pcr screen     Status: None   Collection Time: 02/06/21  1:51 AM   Specimen: Nasal Mucosa; Nasal Swab  Result Value Ref Range Status   MRSA, PCR NEGATIVE NEGATIVE Final   Staphylococcus aureus NEGATIVE NEGATIVE Final    Comment: (NOTE) The Xpert SA Assay (FDA approved  for NASAL specimens in patients 14 years of age and older), is one component of a comprehensive surveillance program. It  is not intended to diagnose infection nor to guide or monitor treatment. Performed at [facility], [place] [address], [place], [place] [address]   Culture, blood (Routine X 2) w Reflex to ID Panel     Status: None   Collection Time: 02/06/21  5:29 AM   Specimen: BLOOD RIGHT HAND  Result Value Ref Range Status   Specimen Description BLOOD RIGHT HAND  Final   Special Requests   Final    BOTTLES DRAWN AEROBIC AND ANAEROBIC Blood Culture adequate volume   Culture   Final    NO GROWTH 5 DAYS Performed at [facility], [place] [address] [place], [place] [address]    Report Status 02/11/2021 FINAL  Final  Aerobic/Anaerobic Culture w Gram Stain (surgical/deep wound)     Status: None   Collection Time: 02/06/21  9:47 AM   Specimen: PATH Other; Tissue  Result Value Ref Range Status   Specimen Description TISSUE  Final   Special Requests LEFT HIP ABSC SPEC A  Final   Gram Stain   Final    FEW WBC PRESENT,BOTH PMN AND MONONUCLEAR MODERATE GRAM POSITIVE COCCI RARE GRAM NEGATIVE RODS    Culture   Final    ABUNDANT ESCHERICHIA COLI ABUNDANT ENTEROCOCCUS FAECALIS MODERATE ACINETOBACTER CALCOACETICUS/BAUMANNII COMPLEX MODERATE BACTEROIDES THETAIOTAOMICRON BETA LACTAMASE POSITIVE Performed at [facility], [place] [address], [place], [place] [address]    Report Status 02/11/2021 FINAL  Final   Organism ID, Bacteria ESCHERICHIA COLI  Final   Organism ID, Bacteria ENTEROCOCCUS FAECALIS  Final   Organism ID, Bacteria ACINETOBACTER CALCOACETICUS/BAUMANNII COMPLEX  Final      Susceptibility   Acinetobacter calcoaceticus/baumannii complex - MIC*    CEFTAZIDIME >=64 RESISTANT Resistant     CIPROFLOXACIN >=4 RESISTANT Resistant     GENTAMICIN <=1 SENSITIVE Sensitive     IMIPENEM >=16 RESISTANT Resistant     PIP/TAZO >=128 RESISTANT Resistant     TRIMETH/SULFA  >=320 RESISTANT Resistant     AMPICILLIN/SULBACTAM 8 SENSITIVE Sensitive     * MODERATE ACINETOBACTER CALCOACETICUS/BAUMANNII COMPLEX   Escherichia coli - MIC*    AMPICILLIN >=32 RESISTANT Resistant     CEFAZOLIN <=4 SENSITIVE Sensitive     CEFEPIME <=0.12 SENSITIVE Sensitive     CEFTAZIDIME <=1 SENSITIVE Sensitive     CEFTRIAXONE <=0.25 SENSITIVE Sensitive     CIPROFLOXACIN <=0.25 SENSITIVE Sensitive     GENTAMICIN <=1 SENSITIVE Sensitive     IMIPENEM <=0.25 SENSITIVE Sensitive     TRIMETH/SULFA >=320 RESISTANT Resistant     AMPICILLIN/SULBACTAM 8 SENSITIVE Sensitive     PIP/TAZO <=4 SENSITIVE Sensitive     * ABUNDANT ESCHERICHIA COLI   Enterococcus faecalis - MIC*    AMPICILLIN <=2 SENSITIVE Sensitive     VANCOMYCIN 1 SENSITIVE Sensitive     GENTAMICIN SYNERGY RESISTANT Resistant     * ABUNDANT ENTEROCOCCUS FAECALIS     Scheduled Meds: . amiodarone  200 mg Oral Daily  . docusate sodium  100 mg Oral BID  . fludrocortisone  0.05 mg Oral BID WC  . levothyroxine  137 mcg Oral Once per day on Sun Mon Tue Wed Thu Fri  . levothyroxine  68.5 mcg Oral Once per day on Sat  . midodrine  5 mg Oral BID WC  . polyethylene glycol  17 g Oral BID  . rosuvastatin  10 mg Oral Once per day on Mon Thu   Continuous Infusions: . ampicillin-sulbactam (UNASYN) IV 3 g (02/12/21 0540)  LOS: 7 days   Cherene Altes, MD [facility] Office  [phone] Pager - Text Page per Amion  If 7PM-7AM, please contact night-coverage per Amion 02/12/2021, 8:55 AM

## 2021-02-12 NOTE — Progress Notes (Signed)
Inpatient Rehab Admissions Coordinator Note:   Per PT recommendation, pt was screened for CIR candidacy by Gayland Curry, MS, CCC-SLP.  At this time we are recommending an inpatient rehab consult. AC will place consult order per protocol.  Please contact me with questions.    Gayland Curry, [place], [place] Admissions Coordinator [phone] 02/12/21 12:44 PM

## 2021-02-12 NOTE — Progress Notes (Signed)
Inpatient Rehabilitation Admissions Coordinator  Inpatient rehab consult received. I know patient from her recent CIR admit. I met at bedside and reviewed CIR goals. She prefers CIR again prior to d/c home. States she is having surgery this afternoon. I will follow up with her progress tomorrow. She is a candidate for a brief CIR admit pending bed availability.  Danne Baxter, RN, MSN Rehab Admissions Coordinator [phone] 02/12/2021 1:44 PM

## 2021-02-12 NOTE — Progress Notes (Signed)
Occupational Therapy re-assessment Patient Details Name: Kayla Harrison MRN: [id number] DOB: 23-Mar-1946 Today's Date: 02/12/2021    History of present illness Pt is a 75 y/o female admitted 5/16 secondary to infected L thigh hematoma. Pt is s/p I And D on 5/17. Pt with admission in 3/22 following fall that resulted in L hematoma and was s/p evacuation on 12/19/20. PMH includes a fib, s/p pacemaker, non hodgkins lymphoma and HTN.   OT comments  Pt reassessment for new disposition. Pt expecting wound vac placement. Pt education for increasing independence by going over mobility and education for ADL performance at EOB and at sink. Pt with LOB episodes and inability to properly care for self; fatigue after going to bathroom and at home, pt would be limited to what she could do by herself as family is in out of town and unable to assist daily. Pt would benefit from continued OT skilled services. OT following acutely.   Follow Up Recommendations  CIR    Equipment Recommendations  None recommended by OT    Recommendations for Other Services      Precautions / Restrictions Precautions Precautions: Fall Precaution Comments: has hx of orthostatic hypotension Restrictions Weight Bearing Restrictions: No LLE Weight Bearing: Weight bearing as tolerated       Mobility Bed Mobility Overal bed mobility: Needs Assistance Bed Mobility: Sit to Supine;Supine to Sit     Supine to sit: Min guard Sit to supine: Min guard   General bed mobility comments: in bed pre and post visit    Transfers Overall transfer level: Needs assistance Equipment used: Rolling walker (2 wheeled) Transfers: Sit to/from Stand Sit to Stand: Min guard         General transfer comment: [email or web address]    Balance Overall balance assessment: Needs assistance Sitting-balance support: No upper extremity supported;Feet supported Sitting balance-Leahy Scale: Good     Standing balance support: Bilateral upper  extremity supported;During functional activity Standing balance-Leahy Scale: Fair Standing balance comment: Reliant on UE support                           ADL either performed or assessed with clinical judgement   ADL Overall ADL's : Needs assistance/impaired Eating/Feeding: Independent;Sitting   Grooming: Wash/dry hands;Wash/dry face;Oral care;Min guard;Standing Grooming Details (indicate cue type and reason): Pt reporting no dizziness in standing today; able to stand x4 mins for grooming task Upper Body Bathing: Set up;Modified independent;Sitting   Lower Body Bathing: Moderate assistance;Sitting/lateral leans;Sit to/from stand   Upper Body Dressing : Set up;Modified independent;Sitting   Lower Body Dressing: Moderate assistance;Sitting/lateral leans;Sit to/from stand   Toilet Transfer: Min guard;Cueing for sequencing;Cueing for safety;Ambulation;Comfort height toilet;Grab bars;RW Armed forces technical officer Details (indicate cue type and reason): cues for RW safety management Toileting- Clothing Manipulation and Hygiene: Min guard;Cueing for sequencing;Cueing for safety;Sitting/lateral lean;Sit to/from stand Toileting - Clothing Manipulation Details (indicate cue type and reason): pericare in standing; LOB episodes in standing, but able to right reactions Tub/ Banker:  (not assessed)   Functional mobility during ADLs: Min guard;Cueing for safety;Cueing for sequencing;Rolling walker General ADL Comments: Pt education for increasing independence by going over mobility and education for ADL performance at EOB and at sink. Pt with LOB episodes and inability to properly care for self; fatigue after going to bathroom and at home, pt would be limited to what she could do by herself as family is in out of town and unable to assist daily.  Vision Baseline Vision/History: Wears glasses Wears Glasses: Reading only Patient Visual Report: No change from baseline Vision  Assessment?: No apparent visual deficits   Perception Perception Perception Tested?: No   Praxis Praxis Praxis tested?: Not tested    Cognition Arousal/Alertness: Awake/alert Behavior During Therapy: WFL for tasks assessed/performed;Anxious Overall Cognitive Status: Within Functional Limits for tasks assessed                                 General Comments: Aware ofcurrent situation and anxious about wound vac insertion now realizing that she is going to need more assist at home.        Exercises     Shoulder Instructions       General Comments L inner thigh blisters    Pertinent Vitals/ Pain       Pain Assessment: 0-10 Pain Score: 4  Pain Location: L thigh Pain Descriptors / Indicators: Dull;Sore;Operative site guarding Pain Intervention(s): Repositioned;Monitored during session  [place] expects to be discharged to:: Private residence Living Arrangements: Alone Available Help at Discharge: Available 24 hours/day;Friend(s) Type of Home: Other(Comment) (condo) Home Access: Level entry     Home Layout: One level     Bathroom Shower/Tub: Occupational psychologist: Handicapped height Bathroom Accessibility: Yes   Home Equipment: Environmental consultant - 4 wheels;Shower seat - built in;Grab bars - tub/shower;Grab bars - toilet;Cane - single point          Prior Functioning/Environment Level of Independence: Independent with assistive device(s)        Comments: mod I with rollator   Frequency  Min 2X/week        Progress Toward Goals  OT Goals(current goals can now be found in the care plan section)  Progress towards OT goals: Progressing toward goals  Acute Rehab OT Goals Patient Stated Goal: to go home OT Goal Formulation: With patient Time For Goal Achievement: 02/24/21 Potential to Achieve Goals: Good ADL Goals Pt Will Perform Grooming: with modified independence;sitting;standing Pt Will Perform Lower Body Bathing:  with min guard assist;sitting/lateral leans;sit to/from stand Pt Will Perform Lower Body Dressing: with min guard assist;sitting/lateral leans;sit to/from stand Pt Will Transfer to Toilet: with modified independence;ambulating;grab bars;regular height toilet Pt Will Perform Toileting - Clothing Manipulation and hygiene: with modified independence;sitting/lateral leans;sit to/from stand Pt Will Perform Tub/Shower Transfer: Shower transfer;with modified independence;ambulating;shower seat;grab bars;rolling walker Pt/caregiver will Perform Home Exercise Program: Increased ROM;Increased strength;Both right and left upper extremity;With Supervision Additional ADL Goal #1: Pt will verbalize/demonstrate 3 fall prevention strategies to incorporate into ADLs/ADL mobility to increase safety awareness with mod I overall. Additional ADL Goal #2: Pt will verbalize/demonstrate 3 energy conservation strategies to incorporate into ADLs/ADL mobility to increase activity tolerance with mod I overall.  Plan Discharge plan needs to be updated    Co-evaluation                 AM-PAC OT "6 Clicks" Daily Activity     Outcome Measure   Help from another person eating meals?: None Help from another person taking care of personal grooming?: A Little Help from another person toileting, which includes using toliet, bedpan, or urinal?: A Little Help from another person bathing (including washing, rinsing, drying)?: A Lot Help from another person to put on and taking off regular upper body clothing?: A Little Help from another person to put on and taking off regular lower body clothing?: A Lot 6  Click Score: 17    End of Session Equipment Utilized During Treatment: Gait belt;Rolling walker  OT Visit Diagnosis: Unsteadiness on feet (R26.81);History of falling (Z91.81);Muscle weakness (generalized) (M62.81);Pain Pain - Right/Left: Left Pain - part of body: Leg   Activity Tolerance Patient tolerated treatment  well;Patient limited by fatigue   Patient Left in bed;with call bell/phone within reach;Other (comment) ("I will not get up.")   Nurse Communication Mobility status        Time: 1325-1400 OT Time Calculation (min): 35 min  Charges: OT General Charges $OT Visit: 1 Visit OT Treatments $Self Care/Home Management : 8-22 mins $Therapeutic Activity: 8-22 mins  Jefferey Pica, OTR/L Acute Rehabilitation Services Pager: [phone] Office: [place] [facility] 02/12/2021, 4:07 PM

## 2021-02-12 NOTE — Progress Notes (Signed)
     Kayla Harrison is a 75 y.o. female   Orthopaedic diagnosis: Left thigh hematoma/abscess with deep wound status post incision and drainage with debridement and drain placement.  Subjective: Patient clinically is doing better.  She is able to get up and ambulate.  She states she feels like the drains are pulling out.  They have slowed down their output.  The nurses have been stripping the drains and recharging them.  She denies fevers or chills.  Denies worsening pain.  Objectyive: Vitals:   02/11/21 2043 02/12/21 0556  BP: 133/61 (!) 150/74  Pulse: 70 73  Resp: 17 18  Temp: 98.2 F (36.8 C) 98.7 F (37.1 C)  SpO2: 99% 96%     Exam: Awake and alert Respirations even and unlabored No acute distress  Left thigh dressing was removed.  The proximal and distal portions of the wound have healed.  The central portion of the wound has not yet fully healed.  No overt wound dehiscence.  Sutures remain in place with good tension.  Drains are in place without evidence of the sutures pulling out.  I stripped the drains.  There is bloody output.  On her proximal medial thigh she has abrasion and blistering.  No signs of infection.  Microbiology: Escherichia coli, Enterococcus faecalis, Acinetobacter    Assessment: 1 week status post incision and drainage with debridement and drain placement for left thigh wound dehiscence and abscess.   Plan: The drains were stripped and seem to be funcitoning well.  We discussed the nature of the location of her hematoma and abscess and the difficulty with this given that it is in a dependent position.  She does have a small area on her wound that has not healed and there is mild drainage from this area.  We discussed placing a Prevena incisional wound VAC on this to aid with healing.  She is in agreement with this.  The drains continue to function well which is encouraging.  I will order the Prevena wound VAC supplies to be at the bedside and will  place this once they are available.  Patient is on Unasyn.  Anticoagulation has been held.  Physical therapy and Occupational Therapy have been reconsulted to evaluate for the need for possible short-term rehab placement.  Patient feels this would be the best thing and I agree with her.  I also discussed that if the central part of her wound looks to be dehisced and no longer held tightly with the suture material we would likely need to consider revision washout with placement of deep wound VAC.  This is not the case currently but we will keep a close eye on the wound.  I have ordered an ESR and a CRP to monitor improvement of her her infection.   Radene Journey, MD

## 2021-02-12 NOTE — Progress Notes (Signed)
Physical Therapy Treatment Patient Details Name: Kayla Harrison MRN: [id number] DOB: 11/29/1945 Today's Date: 02/12/2021    History of Present Illness Pt is a 75 y/o female admitted 5/16 secondary to infected L thigh hematoma. Pt is s/p I And D on 5/17. Pt with admission in 3/22 following fall that resulted in L hematoma and was s/p evacuation on 12/19/20. PMH includes a fib, s/p pacemaker, non hodgkins lymphoma and HTN.    PT Comments    Pt progressing well. Pt remains deconditioned with impaired balance. Pt remains at increased falls risk especially when performing task without UE support. Pt with improved ambulation tolerance to 50' without report of dizziness but did require seated rest break prior to doing it again. Pt was trying to perform pericare in bathroom and had LOB requiring minA to prevent fall. Pt did remainder of simple hygiene in chair. Pt reports she can stay with her sister for 2 nights. Recommend CIR Upon d/c as pt demonstrates excellent potential to reach safe mod I level of function. Pt has gone to CIR recently, did very well, and was able to d/c home. Acute PT to cont to follow.   Follow Up Recommendations  CIR     Equipment Recommendations   (none)    Recommendations for Other Services       Precautions / Restrictions Precautions Precautions: Fall Restrictions Weight Bearing Restrictions: No LLE Weight Bearing: Weight bearing as tolerated    Mobility  Bed Mobility Overal bed mobility: Needs Assistance Bed Mobility: Sit to Supine       Sit to supine: Min assist   General bed mobility comments: minA for L LE management with bed in lowest position, pt reports her bed at home is really high and she needs a step to get into it    Transfers Overall transfer level: Needs assistance Equipment used: Rolling walker (2 wheeled) Transfers: Sit to/from Stand Sit to Stand: Min guard         General transfer comment: close min guard, verbal cues for  walker management, pt reports "my legs will give out on me if i'm not careful"  Ambulation/Gait Ambulation/Gait assistance: Min assist Gait Distance (Feet): 50 Feet (x2) Assistive device: Rolling walker (2 wheeled) Gait Pattern/deviations: Step-through pattern;Decreased stride length;Trunk flexed Gait velocity: slow Gait velocity interpretation: <1.31 ft/sec, indicative of household ambulator General Gait Details: pt very dependent on RW, verbal cues for walker management, pt requiring seated rest break   Stairs             Wheelchair Mobility    Modified Rankin (Stroke Patients Only)       Balance Overall balance assessment: Needs assistance Sitting-balance support: No upper extremity supported;Feet supported Sitting balance-Leahy Scale: Good     Standing balance support: Bilateral upper extremity supported Standing balance-Leahy Scale: Poor Standing balance comment: Reliant on UE support, pt was trying to perform pericare in standing and had an episode of LOB requiring minA to prevent falling                            Cognition Arousal/Alertness: Awake/alert Behavior During Therapy: Anxious Overall Cognitive Status: Within Functional Limits for tasks assessed                                 General Comments: anxious regarding going home due to  "i'm so weak"  Exercises      General Comments General comments (skin integrity, edema, etc.): pt with noted blisters on L thigh      Pertinent Vitals/Pain Pain Assessment: 0-10 Pain Score: 5  Pain Location: L thigh Pain Descriptors / Indicators: Dull;Sore;Operative site guarding Pain Intervention(s): Monitored during session    Home Living                      Prior Function            PT Goals (current goals can now be found in the care plan section) Acute Rehab PT Goals Patient Stated Goal: go to rehab Progress towards PT goals: Progressing toward goals     Frequency    Min 3X/week      PT Plan Discharge plan needs to be updated    Co-evaluation              AM-PAC PT "6 Clicks" Mobility   Outcome Measure  Help needed turning from your back to your side while in a flat bed without using bedrails?: A Little Help needed moving from lying on your back to sitting on the side of a flat bed without using bedrails?: A Little Help needed moving to and from a bed to a chair (including a wheelchair)?: A Little Help needed standing up from a chair using your arms (e.g., wheelchair or bedside chair)?: A Little Help needed to walk in hospital room?: A Little Help needed climbing 3-5 steps with a railing? : A Lot 6 Click Score: 17    End of Session Equipment Utilized During Treatment: Gait belt Activity Tolerance: Patient tolerated treatment well Patient left: in bed;with call bell/phone within reach Nurse Communication: Mobility status PT Visit Diagnosis: Unsteadiness on feet (R26.81);Muscle weakness (generalized) (M62.81)     Time: 5093-2671 PT Time Calculation (min) (ACUTE ONLY): 33 min  Charges:  $Gait Training: 8-22 mins $Therapeutic Activity: 8-22 mins                     Kittie Plater, PT, DPT Acute Rehabilitation Services Pager #: [phone] Office #: [phone]    Berline Lopes 02/12/2021, 12:20 PM

## 2021-02-13 ENCOUNTER — Other Ambulatory Visit: Payer: Self-pay | Admitting: Internal Medicine

## 2021-02-13 ENCOUNTER — Encounter (HOSPITAL_COMMUNITY): Payer: Self-pay | Admitting: Physical Medicine and Rehabilitation

## 2021-02-13 ENCOUNTER — Other Ambulatory Visit: Payer: Self-pay

## 2021-02-13 ENCOUNTER — Inpatient Hospital Stay (HOSPITAL_COMMUNITY)
Admission: RE | Admit: 2021-02-13 | Discharge: 2021-02-23 | DRG: 948 | Disposition: A | Discharge status: Home Health | Payer: Medicare Other | Source: Intra-hospital | Attending: Physical Medicine and Rehabilitation | Admitting: Physical Medicine and Rehabilitation

## 2021-02-13 ENCOUNTER — Other Ambulatory Visit (HOSPITAL_COMMUNITY): Payer: Self-pay

## 2021-02-13 DIAGNOSIS — Z2239 Carrier of other specified bacterial diseases: Secondary | ICD-10-CM

## 2021-02-13 DIAGNOSIS — R921 Mammographic calcification found on diagnostic imaging of breast: Secondary | ICD-10-CM

## 2021-02-13 DIAGNOSIS — B952 Enterococcus as the cause of diseases classified elsewhere: Secondary | ICD-10-CM | POA: Diagnosis present

## 2021-02-13 DIAGNOSIS — A499 Bacterial infection, unspecified: Secondary | ICD-10-CM

## 2021-02-13 DIAGNOSIS — T8131XS Disruption of external operation (surgical) wound, not elsewhere classified, sequela: Secondary | ICD-10-CM | POA: Diagnosis not present

## 2021-02-13 DIAGNOSIS — I10 Essential (primary) hypertension: Secondary | ICD-10-CM | POA: Diagnosis present

## 2021-02-13 DIAGNOSIS — Z8673 Personal history of transient ischemic attack (TIA), and cerebral infarction without residual deficits: Secondary | ICD-10-CM

## 2021-02-13 DIAGNOSIS — Z800 Family history of malignant neoplasm of digestive organs: Secondary | ICD-10-CM | POA: Diagnosis not present

## 2021-02-13 DIAGNOSIS — Z452 Encounter for adjustment and management of vascular access device: Secondary | ICD-10-CM | POA: Diagnosis not present

## 2021-02-13 DIAGNOSIS — Z85118 Personal history of other malignant neoplasm of bronchus and lung: Secondary | ICD-10-CM

## 2021-02-13 DIAGNOSIS — L02416 Cutaneous abscess of left lower limb: Secondary | ICD-10-CM | POA: Diagnosis present

## 2021-02-13 DIAGNOSIS — A498 Other bacterial infections of unspecified site: Secondary | ICD-10-CM

## 2021-02-13 DIAGNOSIS — Z950 Presence of cardiac pacemaker: Secondary | ICD-10-CM

## 2021-02-13 DIAGNOSIS — R600 Localized edema: Secondary | ICD-10-CM | POA: Diagnosis not present

## 2021-02-13 DIAGNOSIS — K5900 Constipation, unspecified: Secondary | ICD-10-CM | POA: Diagnosis present

## 2021-02-13 DIAGNOSIS — R296 Repeated falls: Secondary | ICD-10-CM | POA: Diagnosis present

## 2021-02-13 DIAGNOSIS — Z1611 Resistance to penicillins: Secondary | ICD-10-CM | POA: Diagnosis present

## 2021-02-13 DIAGNOSIS — Z85850 Personal history of malignant neoplasm of thyroid: Secondary | ICD-10-CM

## 2021-02-13 DIAGNOSIS — A4151 Sepsis due to Escherichia coli [E. coli]: Secondary | ICD-10-CM | POA: Diagnosis not present

## 2021-02-13 DIAGNOSIS — Z9884 Bariatric surgery status: Secondary | ICD-10-CM

## 2021-02-13 DIAGNOSIS — I4891 Unspecified atrial fibrillation: Secondary | ICD-10-CM | POA: Diagnosis not present

## 2021-02-13 DIAGNOSIS — E876 Hypokalemia: Secondary | ICD-10-CM | POA: Diagnosis not present

## 2021-02-13 DIAGNOSIS — T148XXA Other injury of unspecified body region, initial encounter: Secondary | ICD-10-CM | POA: Diagnosis not present

## 2021-02-13 DIAGNOSIS — E890 Postprocedural hypothyroidism: Secondary | ICD-10-CM | POA: Diagnosis present

## 2021-02-13 DIAGNOSIS — Z79899 Other long term (current) drug therapy: Secondary | ICD-10-CM

## 2021-02-13 DIAGNOSIS — Z6833 Body mass index (BMI) 33.0-33.9, adult: Secondary | ICD-10-CM

## 2021-02-13 DIAGNOSIS — Z79890 Hormone replacement therapy (postmenopausal): Secondary | ICD-10-CM

## 2021-02-13 DIAGNOSIS — Z8572 Personal history of non-Hodgkin lymphomas: Secondary | ICD-10-CM | POA: Diagnosis not present

## 2021-02-13 DIAGNOSIS — T8130XA Disruption of wound, unspecified, initial encounter: Secondary | ICD-10-CM

## 2021-02-13 DIAGNOSIS — Z1624 Resistance to multiple antibiotics: Secondary | ICD-10-CM | POA: Diagnosis present

## 2021-02-13 DIAGNOSIS — Z7901 Long term (current) use of anticoagulants: Secondary | ICD-10-CM

## 2021-02-13 DIAGNOSIS — D62 Acute posthemorrhagic anemia: Secondary | ICD-10-CM | POA: Diagnosis present

## 2021-02-13 DIAGNOSIS — I480 Paroxysmal atrial fibrillation: Secondary | ICD-10-CM

## 2021-02-13 DIAGNOSIS — E785 Hyperlipidemia, unspecified: Secondary | ICD-10-CM | POA: Diagnosis present

## 2021-02-13 DIAGNOSIS — G4700 Insomnia, unspecified: Secondary | ICD-10-CM | POA: Diagnosis present

## 2021-02-13 DIAGNOSIS — E669 Obesity, unspecified: Secondary | ICD-10-CM | POA: Diagnosis present

## 2021-02-13 DIAGNOSIS — R5381 Other malaise: Principal | ICD-10-CM | POA: Diagnosis present

## 2021-02-13 DIAGNOSIS — Z833 Family history of diabetes mellitus: Secondary | ICD-10-CM

## 2021-02-13 DIAGNOSIS — Z923 Personal history of irradiation: Secondary | ICD-10-CM | POA: Diagnosis not present

## 2021-02-13 DIAGNOSIS — Z8249 Family history of ischemic heart disease and other diseases of the circulatory system: Secondary | ICD-10-CM

## 2021-02-13 DIAGNOSIS — R238 Other skin changes: Secondary | ICD-10-CM

## 2021-02-13 DIAGNOSIS — L089 Local infection of the skin and subcutaneous tissue, unspecified: Secondary | ICD-10-CM | POA: Diagnosis not present

## 2021-02-13 DIAGNOSIS — I4819 Other persistent atrial fibrillation: Secondary | ICD-10-CM | POA: Diagnosis not present

## 2021-02-13 DIAGNOSIS — Z853 Personal history of malignant neoplasm of breast: Secondary | ICD-10-CM | POA: Diagnosis not present

## 2021-02-13 DIAGNOSIS — I951 Orthostatic hypotension: Secondary | ICD-10-CM

## 2021-02-13 DIAGNOSIS — K5901 Slow transit constipation: Secondary | ICD-10-CM | POA: Diagnosis not present

## 2021-02-13 MED ORDER — FLUDROCORTISONE ACETATE 0.1 MG PO TABS
0.0500 mg | ORAL_TABLET | Freq: Two times a day (BID) | ORAL | Status: DC
  Administered 2021-02-13 – 2021-02-20 (×14): 0.05 mg via ORAL
  Filled 2021-02-13 (×17): qty 0.5

## 2021-02-13 MED ORDER — MINOCYCLINE HCL 100 MG PO CAPS: 100.0000 mg | ORAL_CAPSULE | Freq: Two times a day (BID) | ORAL | Status: DC

## 2021-02-13 MED ORDER — ONDANSETRON HCL 4 MG PO TABS: 4.0000 mg | ORAL_TABLET | Freq: Four times a day (QID) | ORAL | Status: DC | PRN

## 2021-02-13 MED ORDER — MIDODRINE HCL 5 MG PO TABS
5.0000 mg | ORAL_TABLET | Freq: Two times a day (BID) | ORAL | Status: DC
  Administered 2021-02-13 – 2021-02-19 (×12): 5 mg via ORAL
  Filled 2021-02-13 (×13): qty 1

## 2021-02-13 MED ORDER — SODIUM CHLORIDE 0.9% FLUSH: 10.0000 mL | INTRAVENOUS | Status: DC | PRN

## 2021-02-13 MED ORDER — ONDANSETRON HCL 4 MG/2ML IJ SOLN: 4.0000 mg | Freq: Four times a day (QID) | INTRAMUSCULAR | Status: DC | PRN

## 2021-02-13 MED ORDER — SODIUM CHLORIDE 0.9% FLUSH
10.0000 mL | Freq: Two times a day (BID) | INTRAVENOUS | Status: DC
  Administered 2021-02-16 (×2): 10 mL

## 2021-02-13 MED ORDER — LACTULOSE 10 GM/15ML PO SOLN
30.0000 g | Freq: Every day | ORAL | Status: DC | PRN
  Administered 2021-02-14: 30 g via ORAL
  Filled 2021-02-13: qty 45

## 2021-02-13 MED ORDER — HYDROCODONE-ACETAMINOPHEN 5-325 MG PO TABS
1.0000 | ORAL_TABLET | ORAL | Status: DC | PRN
  Administered 2021-02-13 – 2021-02-20 (×7): 1 via ORAL
  Filled 2021-02-13: qty 2
  Filled 2021-02-13 (×4): qty 1
  Filled 2021-02-13: qty 2
  Filled 2021-02-13: qty 1
  Filled 2021-02-13: qty 2

## 2021-02-13 MED ORDER — LEVOTHYROXINE SODIUM 25 MCG PO TABS
137.0000 ug | ORAL_TABLET | ORAL | Status: DC
  Administered 2021-02-14 – 2021-02-23 (×9): 137 ug via ORAL
  Filled 2021-02-13 (×8): qty 1

## 2021-02-13 MED ORDER — TRAZODONE HCL 50 MG PO TABS
25.0000 mg | ORAL_TABLET | Freq: Every evening | ORAL | Status: DC | PRN
  Administered 2021-02-13: 25 mg via ORAL
  Filled 2021-02-13: qty 1

## 2021-02-13 MED ORDER — SODIUM CHLORIDE 0.9 % IV SOLN
3.0000 g | Freq: Four times a day (QID) | INTRAVENOUS | Status: DC
  Administered 2021-02-13 – 2021-02-20 (×24): 3 g via INTRAVENOUS
  Filled 2021-02-13: qty 3
  Filled 2021-02-13 (×2): qty 8
  Filled 2021-02-13 (×2): qty 3
  Filled 2021-02-13 (×2): qty 8
  Filled 2021-02-13 (×3): qty 3
  Filled 2021-02-13: qty 8
  Filled 2021-02-13 (×13): qty 3
  Filled 2021-02-13 (×2): qty 8
  Filled 2021-02-13 (×3): qty 3

## 2021-02-13 MED ORDER — DOCUSATE SODIUM 100 MG PO CAPS
100.0000 mg | ORAL_CAPSULE | Freq: Two times a day (BID) | ORAL | Status: DC
  Administered 2021-02-13 – 2021-02-23 (×12): 100 mg via ORAL
  Filled 2021-02-13 (×18): qty 1

## 2021-02-13 MED ORDER — MINOCYCLINE HCL 100 MG PO CAPS: 200.0000 mg | ORAL_CAPSULE | Freq: Two times a day (BID) | ORAL | Status: DC

## 2021-02-13 MED ORDER — ROSUVASTATIN CALCIUM 5 MG PO TABS
10.0000 mg | ORAL_TABLET | ORAL | Status: DC
  Administered 2021-02-15 – 2021-02-22 (×3): 10 mg via ORAL
  Filled 2021-02-13 (×3): qty 2

## 2021-02-13 MED ORDER — AMOXICILLIN-POT CLAVULANATE 875-125 MG PO TABS: 1.0000 | ORAL_TABLET | Freq: Two times a day (BID) | ORAL | Status: DC

## 2021-02-13 MED ORDER — AMIODARONE HCL 200 MG PO TABS
200.0000 mg | ORAL_TABLET | Freq: Every day | ORAL | Status: DC
  Administered 2021-02-14 – 2021-02-21 (×8): 200 mg via ORAL
  Filled 2021-02-13 (×8): qty 1

## 2021-02-13 MED ORDER — BISACODYL 10 MG RE SUPP: 10.0000 mg | Freq: Every day | RECTAL | Status: DC | PRN

## 2021-02-13 MED ORDER — POLYETHYLENE GLYCOL 3350 17 G PO PACK
17.0000 g | PACK | Freq: Two times a day (BID) | ORAL | Status: DC
  Administered 2021-02-13 – 2021-02-16 (×6): 17 g via ORAL
  Filled 2021-02-13 (×14): qty 1

## 2021-02-13 MED ORDER — ACETAMINOPHEN 325 MG PO TABS: 650.0000 mg | ORAL_TABLET | Freq: Four times a day (QID) | ORAL | Status: DC | PRN

## 2021-02-13 MED ORDER — LEVOTHYROXINE SODIUM 25 MCG PO TABS
68.5000 ug | ORAL_TABLET | ORAL | Status: DC
  Administered 2021-02-17: 68.5 ug via ORAL
  Filled 2021-02-13: qty 1
  Filled 2021-02-13: qty 0.5

## 2021-02-13 NOTE — H&P (Signed)
Physical Medicine and Rehabilitation Admission H&P        Chief Complaint  Patient presents with  . Wound Infection  : HPI: Kayla Harrison is a 75 year old right-handed female with history of thyroid cancer, hypothyroidism, NHL/SVC syndrome /lung cancer with radiation therapy as well as chemotherapy 2018 followed by Dr. Lindi Adie, pacemaker placement for atrial fibrillation maintained on Eliquis followed by Dr. Caryl Comes, obesity status post lap band surgery, hypertension, hyperlipidemia, obesity with BMI 33.64, recurrent falls.  Recent mission CIR 12/22/2020 to 01/09/2021 for functional deficits related to left coronoid process fracture and left hip labral hamstring tendon tear with associated large hematoma requiring I&D and wound VAC.  She returned to the ER 01/10/2021 for open wound to left thigh that were bleeding from prior hematoma I&D site which was not actively bleeding and hemoglobin stable she was discharged back to home.  Per chart review patient lives alone.  Independent with assistive device.  1 level home with level entry.  She has good family support.  Presented 02/05/2021 with purulent malodorous drainage x1 week from left hip wound dehiscence.  Admission chemistries unremarkable aside glucose 103, INR 1.6, lactic acid 1.1, blood cultures no growth to date, hemoglobin 11.2, WBC 8000.  Patient underwent incision and drainage of left thigh deep abscess/hematoma excisional debridement of necrotic skin subcutaneous tissue muscle and fascia with revision of closure/drain placement 02/06/2021 per Dr. Melony Overly and wound VAC was placed.  Wound cultures after I&D E. coli plus E Faecalis and currently maintained on Unasyn.  It was noted to the central part of her wound was a bit dehisced no longer held tightly with suture material consideration being made for revision with washout.  Acute blood loss anemia 8.1 and monitored.  Sedimentation rate mildly elevated 45.  Anticoagulation with  chronic Eliquis for atrial fibrillation currently on hold and does continue on amiodarone at this time.  Close monitoring of blood pressure remaining on ProAmatine as well as Florinef.  Therapy evaluations completed due to patient's decreased functional mobility recommendations of physical medicine rehab consult.   Review of Systems  Constitutional: Positive for malaise/fatigue. Negative for chills and fever.  HENT: Negative for hearing loss.   Eyes: Negative for blurred vision and double vision.  Respiratory: Negative for cough and shortness of breath.   Cardiovascular: Positive for palpitations and leg swelling. Negative for chest pain.  Gastrointestinal: Positive for constipation. Negative for heartburn, nausea and vomiting.  Genitourinary: Negative for dysuria and hematuria.  Musculoskeletal: Positive for falls, joint pain and myalgias.  Skin: Negative for rash.  All other systems reviewed and are negative.   Past Medical History:  Diagnosis Date  . Anemia    . Arthritis      osteoarthritis - knees and right shoulder  . Blood transfusion without reported diagnosis    . Breast cancer ([place])      Dr Margot Chimes, total thyroidectomy- 1999- for cancer  . 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 Chronic bilateral pleural effusions    . Colon polyp      Dr Earlean Shawl  . Complication of anesthesia      Ketamine produces LSD reaction, bright colored nightmarish experience   . Dyslipidemia    . Endometriosis    . Fibroids    . H/O pleural effusion      s/p thoracentesis w 3254ml withdrawn  . Hepatitis      [place] ?hepatitis   . History of dysphagia  due to radiation therapy  . History of hiatal hernia      small noted on PET scan  . Hypertension    . Hypothyroidism    . Lung cancer, lower lobe ([place]) 01/2017    radiation RX completed 03/04/17; will start chemo 6/27, pt unaware of lung cancer  . Morbid obesity ([place])      Status post lap band surgery  . Nephrolithiasis     . Non Hodgkin's lymphoma ([place])      on chemotherapy  . Persistent atrial fibrillation ([place])      a. s/p PVI 2008 b. s/p convergent ablation 9371 complicated by bradycardia requiring pacemaker implant  . Personal history of radiation therapy    . Presence of permanent cardiac pacemaker    . Rotator cuff tear      Right  . Stroke [facility])      2003- [place] x2  . SVC syndrome      with lung mass and non hodgkins lymphoma  . Thyroid cancer ([place]) 2000         Past Surgical History:  Procedure Laterality Date  . ABDOMINAL HYSTERECTOMY   1983  . afib ablation        a. 2008 PVI b. 2014 convergent ablation  . APPENDECTOMY      . BONE MARROW BIOPSY   02/21/2017  . BREAST LUMPECTOMY Left 2010  . bso   1998  . CARDIAC CATHETERIZATION        2015- negative  . CARDIOVERSION   10/09/2012    Procedure: CARDIOVERSION;  Surgeon: Minus Breeding, MD;  Location: [place];  Service: Cardiovascular;  Laterality: N/A;  . CARDIOVERSION   10/09/2012    Procedure: CARDIOVERSION;  Surgeon: Minus Breeding, MD;  Location: [facility] ENDOSCOPY;  Service: Cardiovascular;  Laterality: N/A;  [name] gave the ok to add pt to the add on , but we must check to find out if the can add pt on at [phone] ( [phone])  . CARDIOVERSION N/A 11/20/2012    Procedure: CARDIOVERSION;  Surgeon: Fay Records, MD;  Location: [place];  Service: Cardiovascular;  Laterality: N/A;  . CARDIOVERSION N/A 07/18/2017    Procedure: CARDIOVERSION;  Surgeon: Thayer Headings, MD;  Location: [facility] ENDOSCOPY;  Service: Cardiovascular;  Laterality: N/A;  . CARDIOVERSION N/A 10/03/2017    Procedure: CARDIOVERSION;  Surgeon: Sanda Klein, MD;  Location: [facility] ENDOSCOPY;  Service: Cardiovascular;  Laterality: N/A;  . CARDIOVERSION N/A 01/07/2018    Procedure: CARDIOVERSION;  Surgeon: Thayer Headings, MD;  Location: [facility] ENDOSCOPY;  Service: Cardiovascular;  Laterality: N/A;  . CARDIOVERSION N/A 12/10/2019    Procedure: CARDIOVERSION;  Surgeon: Buford Dresser,  MD;  Location: [facility] ENDOSCOPY;  Service: Cardiovascular;  Laterality: N/A;  . CHOLECYSTECTOMY      . COLONOSCOPY W/ POLYPECTOMY        Dr Earlean Shawl  . CYSTOSCOPY N/A 02/06/2015    Procedure: CYSTOSCOPY;  Surgeon: Kathie Rhodes, MD;  Location: [facility] ORS;  Service: Urology;  Laterality: N/A;  . CYSTOSCOPY W/ RETROGRADES Left 11/17/2017    Procedure: CYSTOSCOPY WITH RETROGRADE /PYELOGRAM/;  Surgeon: Kathie Rhodes, MD;  Location: [facility] ORS;  Service: Urology;  Laterality: Left;  . CYSTOSCOPY WITH RETROGRADE PYELOGRAM, URETEROSCOPY AND STENT PLACEMENT Right 02/06/2015    Procedure: RETROGRADE PYELOGRAM, RIGHT URETEROSCOPY STENT PLACEMENT;  Surgeon: Kathie Rhodes, MD;  Location: [facility] ORS;  Service: Urology;  Laterality: Right;  . CYSTOSCOPY WITH RETROGRADE PYELOGRAM, URETEROSCOPY AND STENT PLACEMENT Right 03/07/2017    Procedure: CYSTOSCOPY WITH RIGHT  RETROGRADE PYELOGRAM,RIGHT  URETEROSCOPYLASER LITHOTRIPSY  AND STENT PLACEMENT AND STONE BASKETRY;  Surgeon: Kathie Rhodes, MD;  Location: [facility];  Service: Urology;  Laterality: Right;  . EYE SURGERY        cataract surgery  . fatty mass removal   1999    pubic area  . HOLMIUM LASER APPLICATION N/A 4/59/9774    Procedure: HOLMIUM LASER APPLICATION;  Surgeon: Kathie Rhodes, MD;  Location: [facility] ORS;  Service: Urology;  Laterality: N/A;  . HOLMIUM LASER APPLICATION Right 1/42/3953    Procedure: HOLMIUM LASER APPLICATION;  Surgeon: Kathie Rhodes, MD;  Location: [facility];  Service: Urology;  Laterality: Right;  . HOLMIUM LASER APPLICATION Left 10/25/3341    Procedure: HOLMIUM LASER APPLICATION;  Surgeon: Kathie Rhodes, MD;  Location: [facility] ORS;  Service: Urology;  Laterality: Left;  . I & D EXTREMITY Left 12/19/2020    Procedure: IRRIGATION AND DEBRIDEMENT OF LEFT HIP HEMATOMA WITH APPLICATION OF WOUND VAC;  Surgeon: Erle Crocker, MD;  Location: [place];  Service: Orthopedics;  Laterality: Left;  . I & D EXTREMITY Left 02/06/2021     Procedure: IRRIGATION AND DEBRIDEMENT DEEP ABCESS LEFT THIGH, SECONDARY CLOSURE OF WOUND DEHISCENCE;  Surgeon: Erle Crocker, MD;  Location: [employer];  Service: Orthopedics;  Laterality: Left;  . IR FLUORO GUIDE PORT INSERTION RIGHT   02/24/2017  . IR NEPHROSTOMY PLACEMENT RIGHT   11/17/2017  . IR PATIENT EVAL TECH 0-60 MINS   03/11/2017  . IR REMOVAL TUN ACCESS W/ PORT W/O FL MOD SED   04/20/2018  . IR US GUIDE VASC ACCESS RIGHT   02/24/2017  . KNEE ARTHROSCOPY        bilateral  . LAPAROSCOPIC GASTRIC BANDING   07/10/2010  . LAPAROSCOPIC GASTRIC BANDING        Laparoscopic adjustable banding APS System with posterior hiatal hernia, 2 suture.  Marland Kitchen LAPAROTOMY        for ruptured ovary and ovarian artery   . NEPHROLITHOTOMY Right 11/17/2017    Procedure: NEPHROLITHOTOMY PERCUTANEOUS;  Surgeon: Kathie Rhodes, MD;  Location: [facility]S;  Service: Urology;  Laterality: Right;  . PACEMAKER INSERTION   03/10/2013    MDT dual chamber PPM  . POCKET REVISION N/A 12/08/2013    Procedure: POCKET REVISION;  Surgeon: Deboraha Sprang, MD;  Location: [facility] CATH LAB;  Service: Cardiovascular;  Laterality: N/A;  . PORTA CATH INSERTION      . REVERSE SHOULDER ARTHROPLASTY Right 05/14/2018    Procedure: RIGHT REVERSE SHOULDER ARTHROPLASTY;  Surgeon: Tania Ade, MD;  Location: [place];  Service: Orthopedics;  Laterality: Right;  . REVERSE SHOULDER REPLACEMENT Right 05/14/2018  . RIGHT HEART CATH N/A 07/21/2019    Procedure: RIGHT HEART CATH;  Surgeon: Larey Dresser, MD;  Location: [place] CV LAB;  Service: Cardiovascular;  Laterality: N/A;  . TEE WITH CARDIOVERSION   09/22/2017  . TEE WITHOUT CARDIOVERSION N/A 10/03/2017    Procedure: TRANSESOPHAGEAL ECHOCARDIOGRAM (TEE);  Surgeon: Sanda Klein, MD;  Location: [facility] ENDOSCOPY;  Service: Cardiovascular;  Laterality: N/A;  . TEE WITHOUT CARDIOVERSION N/A 08/04/2019    Procedure: TRANSESOPHAGEAL ECHOCARDIOGRAM (TEE);  Surgeon: Larey Dresser, MD;  Location: [facility]  ENDOSCOPY;  Service: Cardiovascular;  Laterality: N/A;  . TEE WITHOUT CARDIOVERSION N/A 12/10/2019    Procedure: TRANSESOPHAGEAL ECHOCARDIOGRAM (TEE);  Surgeon: Buford Dresser, MD;  Location: [facility] ENDOSCOPY;  Service: Cardiovascular;  Laterality: N/A;  . THYROIDECTOMY   1998    Dr Margot Chimes  . TONSILLECTOMY      .  TOTAL KNEE ARTHROPLASTY   04/13/2012    Procedure: TOTAL KNEE ARTHROPLASTY;  Surgeon: Rudean Haskell, MD;  Location: [place];  Service: Orthopedics;  Laterality: Right;  Marland Kitchen VIDEO BRONCHOSCOPY WITH ENDOBRONCHIAL ULTRASOUND N/A 02/07/2017    Procedure: VIDEO BRONCHOSCOPY WITH ENDOBRONCHIAL ULTRASOUND;  Surgeon: Marshell Garfinkel, MD;  Location: [place];  Service: Pulmonary;  Laterality: N/A;         Family History  Problem Relation Age of Onset  . Heart disease Father 39        MI $R'@autopsy'xo$   . Colon cancer Father          COLON  . Heart attack Father    . Other Mother          temporal arteritis   . Diabetes Sister    . Diabetes Brother    . Diabetes Paternal Aunt    . Diabetes Paternal Grandmother    . Esophageal cancer Neg Hx    . Inflammatory bowel disease Neg Hx    . Liver disease Neg Hx    . Pancreatic cancer Neg Hx    . Stomach cancer Neg Hx      Social History:  reports that she has never smoked. She has never used smokeless tobacco. She reports that she does not drink alcohol and does not use drugs. Allergies:  Allergies  Allergen Reactions  . Dofetilide Other (See Comments)      Prolonged QT interval    . Ranolazine        Balance issues  . Rivaroxaban Other (See Comments)      Nose Bleed X 6 hrs ; packing in ER Nasal hemorrhage  . Rivaroxaban        Nose Bleed X 6 hrs ; packing in ER Nasal hemorrhage  . Tape Other (See Comments)      SKIN IS THIN AND TEARS EASILY    . Tikosyn [Dofetilide] Other (See Comments)      "Long QT wave"    . Epinephrine Other (See Comments)      Oral anesthetic Dental form only (liquid). Patient stated she will become "out of  it" she can hear you but cannot respond in normal fashion. "not fully with it"  . Epinephrine    . Ketamine Other (See Comments)      Hallucinations    . Adhesive [Tape] Rash      Paper tape as well  . Ketamine Other (See Comments)      HALLUCINATIONS          Medications Prior to Admission  Medication Sig Dispense Refill  . acetaminophen (TYLENOL) 325 MG tablet Take 2 tablets (650 mg total) by mouth every 4 (four) hours as needed for mild pain.      Marland Kitchen amiodarone (PACERONE) 200 MG tablet Take 1 tablet (200 mg total) by mouth daily. 90 tablet 3  . ELIQUIS 5 MG TABS tablet TAKE 1 TABLET BY MOUTH  TWICE DAILY (Patient taking differently: Take 5 mg by mouth 2 (two) times daily.) 180 tablet 2  . ferrous sulfate 325 (65 FE) MG tablet Take 1 tablet (325 mg total) by mouth daily with breakfast. 30 tablet 3  . fludrocortisone (FLORINEF) 0.1 MG tablet Take 0.5 tablets (0.05 mg total) by mouth 2 (two) times daily with a meal. 30 tablet 0  . furosemide (LASIX) 20 MG tablet Take 20 mg by mouth daily as needed for fluid or edema.       Marland Kitchen levothyroxine (SYNTHROID) 137 MCG  tablet Take 68.5-137 mcg by mouth See admin instructions. Taking 137 mcg tablet daily except on Saturday taking 1/2 tablet      . Lifitegrast 5 % SOLN Place 1 drop into both eyes at bedtime.       . midodrine (PROAMATINE) 5 MG tablet Take 1 tablet (5 mg total) by mouth 2 (two) times daily with a meal. 60 tablet 0  . nitrofurantoin, macrocrystal-monohydrate, (MACROBID) 100 MG capsule Take 100 mg by mouth 2 (two) times daily.      . rosuvastatin (CRESTOR) 10 MG tablet Take 10 mg by mouth 2 (two) times a week. Takes on Monday and Thursday          Drug Regimen Review Drug regimen was reviewed and remains appropriate with no significant issues identified   Home: Home Living Family/patient expects to be discharged to:: Private residence Living Arrangements: Alone Available Help at Discharge: Available 24 hours/day,Friend(s) Type of  Home: Other(Comment) (condo) Home Access: Level entry Home Layout: One level Bathroom Shower/Tub: Health visitor: Handicapped height Bathroom Accessibility: Yes Home Equipment: Environmental consultant - 4 wheels,Shower seat - built in,Grab bars - tub/shower,Grab bars - toilet,Cane - single point   Functional History: Prior Function Level of Independence: Independent with assistive device(s) Comments: mod I with rollator   Functional Status:  Mobility: Bed Mobility Overal bed mobility: Needs Assistance Bed Mobility: Sit to Supine Rolling: Modified independent (Device/Increase time) (with bed rails) Supine to sit: Mod assist Sit to supine: Min assist General bed mobility comments: minA for L LE management with bed in lowest position, pt reports her bed at home is really high and she needs a step to get into it Transfers Overall transfer level: Needs assistance Equipment used: Rolling walker (2 wheeled) Transfers: Sit to/from Stand Sit to Stand: Min guard Stand pivot transfers: Min guard General transfer comment: close min guard, verbal cues for walker management, pt reports "my legs will give out on me if i'm not careful" Ambulation/Gait Ambulation/Gait assistance: Min assist Gait Distance (Feet): 50 Feet (x2) Assistive device: Rolling walker (2 wheeled) Gait Pattern/deviations: Step-through pattern,Decreased stride length,Trunk flexed General Gait Details: pt very dependent on RW, verbal cues for walker management, pt requiring seated rest break Gait velocity: slow Gait velocity interpretation: <1.31 ft/sec, indicative of household ambulator   ADL: ADL Overall ADL's : Needs assistance/impaired Eating/Feeding: Independent,Sitting Grooming: Wash/dry hands,Wash/dry face,Oral care,Sitting Grooming Details (indicate cue type and reason): attempted to groom at sink but became dizzy and asked to sit down in chair for grooming Upper Body Bathing: Set up,Modified  independent,Sitting Lower Body Bathing: Moderate assistance,Sitting/lateral leans,Sit to/from stand Upper Body Dressing : Set up,Modified independent,Sitting Lower Body Dressing: Moderate assistance,Sitting/lateral leans,Sit to/from stand Toilet Transfer: Min guard,Cueing for sequencing,Cueing for safety,Ambulation,Comfort height toilet,Grab bars,RW Statistician Details (indicate cue type and reason): cues for RW safety management Toileting- Clothing Manipulation and Hygiene: Min guard,Cueing for sequencing,Cueing for safety,Sitting/lateral lean,Sit to/from stand Toileting - Architect Details (indicate cue type and reason): for perineal care in standing after voiding in toilet Tub/ Shower Transfer:  (not assessed) Functional mobility during ADLs: Min guard,Cueing for safety,Cueing for sequencing,Rolling walker General ADL Comments: cues for safe RW management and use of grab bars while participating in ADLs/ADL mobility   Cognition: Cognition Overall Cognitive Status: Within Functional Limits for tasks assessed Orientation Level: Oriented X4 Cognition Arousal/Alertness: Awake/alert Behavior During Therapy: Anxious Overall Cognitive Status: Within Functional Limits for tasks assessed General Comments: anxious regarding going home due to  "i'm so weak"  Physical Exam: Blood pressure (!) 150/74, pulse 73, temperature 98.7 F (37.1 C), temperature source Oral, resp. rate 18, height $RemoveBe'5\' 6"'jNpHDICYs$  (1.676 m), weight 94.5 kg, SpO2 96 %. Physical Exam Constitutional:      General: She is not in acute distress.    Appearance: She is obese.  HENT:     Head: Normocephalic and atraumatic.     Nose: Nose normal.     Mouth/Throat:     Mouth: Mucous membranes are moist.  Eyes:     Conjunctiva/sclera: Conjunctivae normal.     Pupils: Pupils are equal, round, and reactive to light.  Cardiovascular:     Rate and Rhythm: Tachycardia present. Rhythm irregular.     Pulses: Normal pulses.      Comments: Irregularly Irregular Pulmonary:     Effort: Pulmonary effort is normal.  Abdominal:     General: There is no distension.     Palpations: Abdomen is soft.  Musculoskeletal:     Cervical back: Normal range of motion.  Skin:    Comments: Left leg with Vac and serosanguinous drainage. Also with 2 JP drains with the same color fluid. Left leg actually smaller in size compared to when I last saw her.   Neurological:     Mental Status: She is alert.     Comments: Alert and oriented x 3. Normal insight and awareness. Intact Memory. Normal language and speech. Cranial nerve exam unremarkable. UE motor 5/5. LLE 3/5 prox to 4/5 distally. RLE 4-/5 prox to 4/5 distally. No sensory findings appreciated. Normal muscle tone  Psychiatric:        Mood and Affect: Mood normal.        Behavior: Behavior normal.        Thought Content: Thought content normal.        Judgment: Judgment normal.        Lab Results Last 48 Hours        Results for orders placed or performed during the hospital encounter of 02/05/21 (from the past 48 hour(s))  CBC     Status: Abnormal    Collection Time: 02/11/21  1:54 AM  Result Value Ref Range    WBC 8.9 4.0 - 10.5 K/uL    RBC 2.72 (L) 3.87 - 5.11 MIL/uL    Hemoglobin 8.1 (L) 12.0 - 15.0 g/dL    HCT 26.1 (L) 36.0 - 46.0 %    MCV 96.0 80.0 - 100.0 fL    MCH 29.8 26.0 - 34.0 pg    MCHC 31.0 30.0 - 36.0 g/dL    RDW 17.0 (H) 11.5 - 15.5 %    Platelets 255 150 - 400 K/uL    nRBC 0.0 0.0 - 0.2 %      Comment: Performed at [facility], [address] [place], [place] [address]  Sedimentation rate     Status: Abnormal    Collection Time: 02/12/21  9:59 AM  Result Value Ref Range    Sed Rate 45 (H) 0 - 22 mm/hr      Comment: Performed at [facility] [address], [place], [place] [address]  C-reactive protein     Status: Abnormal    Collection Time: 02/12/21  9:59 AM  Result Value Ref Range    CRP 2.3 (H) <1.0 mg/dL      Comment: Performed  at [facility] [address], [place], [place] [address]    *Note: Due to a large number of results  and/or encounters for the requested time period, some results have not been displayed. A complete set of results can be found in Results Review.      Imaging Results (Last 48 hours)  No results found.           Medical Problem List and Plan: 1.  Debility secondary to infected thigh wound hematoma.  Status post I&D 02/06/2021 with a wound VAC and drain placement             -patient may not yet shower             -ELOS/Goals: 7-10 days, supervision to mod I goals with PT and OT 2.  Antithrombotics: -DVT/anticoagulation: SCDs.  Chronic anticoagulation currently on hold             -antiplatelet therapy: N/A 3. Pain Management: Hydrocodone as needed 4. Mood: Provide emotional support             -antipsychotic agents: N/A 5. Neuropsych: This patient is capable of making decisions on her own behalf. 6. Skin/Wound Care: Routine skin checks 7. Fluids/Electrolytes/Nutrition: Routine in and outs with follow-up chemistries 8.  Acute blood loss anemia.  Follow-up CBC 9.  Atrial fibrillation/pacemaker.  Amiodarone 200 mg daily.  Cardiac rate is some what controlled.  Eliquis on hold due to hematoma             -pt is going in and out of a-fib on exam today. Activity tolerance is poor when HR is elevated and irregular.                          -f/u with cardiology as necessary here 10.  Orthostasis.  Florinef 0.05 mg twice daily, ProAmatine 5 mg twice daily.  Monitor with increased mobility 11.  ID.  E. Coli/E Faecalis.  Currently remains on Unasyn.  Will need to define duration of antibiotic.             -ESR, CRP to monitor status of infection             -ID input? 12.  Hypothyroidism.  Synthroid 13.  NHL with SVC syndrome/lung cancer with radiation therapy as well as chemo.  Follow-up Dr. Lindi Adie 14.  Obesity.  Status post lap band surgery.  BMI 33.64.  Dietary follow-up 15.   Hyperlipidemia.  Crestor     Lavon Paganini Angiulli, PA-C 02/12/2021  I have personally performed a face to face diagnostic evaluation of this patient and formulated the key components of the plan.  Additionally, I have personally reviewed laboratory data, imaging studies, as well as relevant notes and concur with the physician assistant's documentation above.  The patient's status has not changed from the original H&P.  Any changes in documentation from the acute care chart have been noted above.  Meredith Staggers, MD, [facility]

## 2021-02-13 NOTE — Progress Notes (Signed)
Inpatient Rehabilitation  Patient information reviewed and entered into eRehab system by Bernette Seeman M. Hayde Kilgour, M.A., CCC/SLP, PPS Coordinator.  Information including medical coding, functional ability and quality indicators will be reviewed and updated through discharge.    

## 2021-02-13 NOTE — Consult Note (Signed)
Date of Admission:  02/13/2021          Reason for Consult: Polymicrobial thigh abscess    Referring Provider: Dr. Thereasa Solo   Assessment:  1. Polymicrobial thigh abscess with Enterococcus faecalis, E. coli and Acinetobacter long with beta-lactamase producing Bacteroides species found on deep operative cultures 2. History of non-Hodgkin's lymphoma 3. Atrial fibrillation on anticoagulation 4. Cardiac ablation complicated by bradycardia requiring pacemaker implantation 5. 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 History of breast cancer  Plan:  1. We will use Unasyn for now while she is in inpatient 2. We will asked the microbiology lab to send off her Acinetobacter for susceptibility testing for minocycline and omadacycline 3. I would like to give her 10-14 days of effective antimicrobial therapy Because she has been bruising frequently with blood sticks and peripheral IV insertion I will order a midline (note she will need to have this placed on the right side )  Active Problems:   Debility   Scheduled Meds: . [START ON 02/14/2021] amiodarone  200 mg Oral Daily  . docusate sodium  100 mg Oral BID  . fludrocortisone  0.05 mg Oral BID WC  . [START ON 02/14/2021] levothyroxine  137 mcg Oral Once per day on Sun Mon Tue Wed Thu Fri  . [START ON 02/17/2021] levothyroxine  68.5 mcg Oral Once per day on Sat  . midodrine  5 mg Oral BID WC  . polyethylene glycol  17 g Oral BID  . [START ON 02/15/2021] rosuvastatin  10 mg Oral Once per day on Mon Thu   Continuous Infusions: PRN Meds:.acetaminophen, bisacodyl, HYDROcodone-acetaminophen, lactulose, ondansetron **OR** ondansetron (ZOFRAN) IV, traZODone  HPI: Kayla Harrison is a 75 y.o. female who originally fell in March and underwent I&D and evacuation of a hematoma on December 19, 2020.  She then developed wound dehiscence withdrainage that persisted.  She was initially referred to wound care and placed on Bactrim. She  then began noticing a foul-smelling drainage from her wound.  She was referred ultimately by wound care to the ER where she was admitted.  Cultures taken of purulent material grew Enterococcus faecalis that was sensitive to ampicillin and vancomycin along with an E. coli that was resistant to ampicillin amp sulbactam and Bactrim.  She had been given vancomycin and amp sulbactam and then cefazolin.  She was taken to the operating room on the 17th and underwent I&D by Dr. Lucia Gaskins with the preoperative cultures yielding Enterococcus faecalis again and E. coli though that was sensitive to ampicillin sulbactam, along with Acinetobacter bowel Monie which was sensitive only to amp sulbactam and gentamicin but resistant against all other antibiotics tested, along with beta-lactamase producing Bacteroides.  Remained on Unasyn since then.  He is being discharged from the inpatient world to inpatient rehab.  I would like to give her at least 10 days if not 14 of effective antimicrobial therapy given that she is already had 2 surgeries on the site.  Also want to do the best we can protect her pacemaker from getting infected through hematogenous infection.  We will asked the microbiology lab to send off for testing for minocycline and omadacycline susceptibility for her Acinetobacter species.  Also place a midline so it can be easier to administer her IV antibiotics.  Her preference was for oral antibiotics but will have greater certainty of covering her organisms if we use the Unasyn for now.  I spent greater than 80  minutes with the patient  including greater than 50% of time in face to face counsel of the patient re nature of the microbes isolated, since patterns personally reviewing radiographic data laboratory data microbiological data and in coordination of her care.     Review of Systems: Review of Systems  Constitutional: Positive for malaise/fatigue. Negative for chills, diaphoresis, fever and  weight loss.  HENT: Negative for congestion, hearing loss, sore throat and tinnitus.   Eyes: Negative for blurred vision and double vision.  Respiratory: Negative for cough, sputum production, shortness of breath and wheezing.   Cardiovascular: Negative for chest pain, palpitations and leg swelling.  Gastrointestinal: Negative for abdominal pain, blood in stool, constipation, diarrhea, heartburn, melena, nausea and vomiting.  Genitourinary: Negative for dysuria, flank pain and hematuria.  Musculoskeletal: Negative for back pain, falls, joint pain and myalgias.  Skin: Negative for itching and rash.  Neurological: Negative for dizziness, sensory change, focal weakness, loss of consciousness, weakness and headaches.  Endo/Heme/Allergies: Bruises/bleeds easily.  Psychiatric/Behavioral: Negative for depression, memory loss and suicidal ideas. The patient is not nervous/anxious.     Past Medical History:  Diagnosis Date  . Anemia   . Arthritis    osteoarthritis - knees and right shoulder  . Blood transfusion without reported diagnosis   . Breast cancer ([place])    Dr Margot Chimes, total thyroidectomy- 1999- for cancer  . 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 Chronic bilateral pleural effusions   . Colon polyp    Dr Earlean Shawl  . Complication of anesthesia    Ketamine produces LSD reaction, bright colored nightmarish experience   . Dyslipidemia   . Endometriosis   . Fibroids   . H/O pleural effusion    s/p thoracentesis w 3225ml withdrawn  . Hepatitis    [place] ?hepatitis   . History of dysphagia    due to radiation therapy  . History of hiatal hernia    small noted on PET scan  . Hypertension   . Hypothyroidism   . Lung cancer, lower lobe ([place]) 01/2017   radiation RX completed 03/04/17; will start chemo 6/27, pt unaware of lung cancer  . Morbid obesity ([place])    Status post lap band surgery  . Nephrolithiasis   . Non Hodgkin's lymphoma ([place])    on chemotherapy  .  Persistent atrial fibrillation ([place])    a. s/p PVI 2008 b. s/p convergent ablation 0037 complicated by bradycardia requiring pacemaker implant  . Personal history of radiation therapy   . Presence of permanent cardiac pacemaker   . Rotator cuff tear    Right  . Stroke [facility])    2003- [place] x2  . SVC syndrome    with lung mass and non hodgkins lymphoma  . Thyroid cancer ([place]) 2000    Social History   Tobacco Use  . Smoking status: Never Smoker  . Smokeless tobacco: Never Used  Vaping Use  . Vaping Use: Never used  Substance Use Topics  . Alcohol use: No    Comment: none since 1990  . Drug use: Never    Family History  Problem Relation Age of Onset  . Heart disease Father 40       MI @autopsy   . Colon cancer Father        COLON  . Heart attack Father   . Other Mother        temporal arteritis   . Diabetes Sister   . Diabetes Brother   . Diabetes Paternal Aunt   .  Diabetes Paternal Grandmother   . Esophageal cancer Neg Hx   . Inflammatory bowel disease Neg Hx   . Liver disease Neg Hx   . Pancreatic cancer Neg Hx   . Stomach cancer Neg Hx    Allergies  Allergen Reactions  . Dofetilide Other (See Comments)    Prolonged QT interval   . Ranolazine     Balance issues  . Rivaroxaban Other (See Comments)    Nose Bleed X 6 hrs ; packing in ER Nasal hemorrhage  . Rivaroxaban     Nose Bleed X 6 hrs ; packing in ER Nasal hemorrhage  . Tape Other (See Comments)    SKIN IS THIN AND TEARS EASILY   . Tikosyn [Dofetilide] Other (See Comments)    "Long QT wave"   . Epinephrine Other (See Comments)    Oral anesthetic Dental form only (liquid). Patient stated she will become "out of it" she can hear you but cannot respond in normal fashion. "not fully with it"  . Epinephrine   . Ketamine Other (See Comments)    Hallucinations   . Adhesive [Tape] Rash    Paper tape as well  . Ketamine Other (See Comments)    HALLUCINATIONS    OBJECTIVE: There were no vitals  taken for this visit.  Physical Exam Constitutional:      General: She is not in acute distress.    Appearance: She is well-developed. She is not diaphoretic.  HENT:     Head: Normocephalic and atraumatic.     Mouth/Throat:     Pharynx: No oropharyngeal exudate.  Eyes:     General: No scleral icterus.    Conjunctiva/sclera: Conjunctivae normal.  Cardiovascular:     Rate and Rhythm: Normal rate. Rhythm irregular.  Pulmonary:     Effort: Pulmonary effort is normal. No respiratory distress.     Breath sounds: No wheezing.  Abdominal:     General: Bowel sounds are normal. There is no distension.  Musculoskeletal:        General: No tenderness.     Cervical back: Normal range of motion and neck supple.  Skin:    General: Skin is warm and dry.     Coloration: Skin is not pale.     Findings: No erythema or rash.  Neurological:     General: No focal deficit present.     Mental Status: She is alert and oriented to person, place, and time.     Motor: No abnormal muscle tone.  Psychiatric:        Mood and Affect: Mood normal.        Behavior: Behavior normal.        Thought Content: Thought content normal.        Judgment: Judgment normal.    Pacemaker site is clean   Left hip with wound vacuum in place   lab Results Lab Results  Component Value Date   WBC 8.9 02/11/2021   HGB 8.1 (L) 02/11/2021   HCT 26.1 (L) 02/11/2021   MCV 96.0 02/11/2021   PLT 255 02/11/2021    Lab Results  Component Value Date   CREATININE 0.93 02/10/2021   BUN 15 02/10/2021   NA 137 02/10/2021   K 4.5 02/10/2021   CL 107 02/10/2021   CO2 23 02/10/2021    Lab Results  Component Value Date   ALT 9 02/10/2021   AST 19 02/10/2021   ALKPHOS 55 02/10/2021   BILITOT 0.6 02/10/2021  Microbiology: Recent Results (from the past 240 hour(s))  Blood culture (routine x 2)     Status: None   Collection Time: 02/05/21  2:58 PM   Specimen: BLOOD LEFT ARM  Result Value Ref Range Status    Specimen Description BLOOD LEFT ARM  Final   Special Requests   Final    BOTTLES DRAWN AEROBIC AND ANAEROBIC Blood Culture adequate volume   Culture   Final    NO GROWTH 6 DAYS Performed at [facility], [address], [place], [place] [address]    Report Status 02/11/2021 FINAL  Final  Aerobic Culture w Gram Stain (superficial specimen)     Status: None   Collection Time: 02/05/21  3:38 PM   Specimen: Wound  Result Value Ref Range Status   Specimen Description WOUND  Final   Special Requests NONE  Final   Gram Stain   Final    ABUNDANT WBC PRESENT, PREDOMINANTLY PMN ABUNDANT GRAM NEGATIVE RODS MODERATE GRAM POSITIVE COCCI Performed at [facility], [place] [address] [place], [place] [address]    Culture   Final    MODERATE ESCHERICHIA COLI MODERATE ENTEROCOCCUS FAECALIS    Report Status 02/09/2021 FINAL  Final   Organism ID, Bacteria ESCHERICHIA COLI  Final   Organism ID, Bacteria ENTEROCOCCUS FAECALIS  Final      Susceptibility   Escherichia coli - MIC*    AMPICILLIN >=32 RESISTANT Resistant     CEFAZOLIN <=4 SENSITIVE Sensitive     CEFEPIME <=0.12 SENSITIVE Sensitive     CEFTAZIDIME <=1 SENSITIVE Sensitive     CEFTRIAXONE <=0.25 SENSITIVE Sensitive     CIPROFLOXACIN <=0.25 SENSITIVE Sensitive     GENTAMICIN <=1 SENSITIVE Sensitive     IMIPENEM <=0.25 SENSITIVE Sensitive     TRIMETH/SULFA >=320 RESISTANT Resistant     AMPICILLIN/SULBACTAM 16 INTERMEDIATE Intermediate     PIP/TAZO <=4 SENSITIVE Sensitive     * MODERATE ESCHERICHIA COLI   Enterococcus faecalis - MIC*    AMPICILLIN <=2 SENSITIVE Sensitive     VANCOMYCIN 1 SENSITIVE Sensitive     GENTAMICIN SYNERGY RESISTANT Resistant     * MODERATE ENTEROCOCCUS FAECALIS  Resp Panel by RT-PCR (Flu A&B, Covid) Nasopharyngeal Swab     Status: None   Collection Time: 02/05/21  4:00 PM   Specimen: Nasopharyngeal Swab; Nasopharyngeal(NP) swabs in vial transport medium  Result Value Ref Range Status   SARS  Coronavirus 2 by RT PCR NEGATIVE NEGATIVE Final    Comment: (NOTE) SARS-CoV-2 target nucleic acids are NOT DETECTED.  The SARS-CoV-2 RNA is generally detectable in upper respiratory specimens during the acute phase of infection. The lowest concentration of SARS-CoV-2 viral copies this assay can detect is 138 copies/mL. A negative result does not preclude SARS-Cov-2 infection and should not be used as the sole basis for treatment or other patient management decisions. A negative result may occur with  improper specimen collection/handling, submission of specimen other than nasopharyngeal swab, presence of viral mutation(s) within the areas targeted by this assay, and inadequate number of viral copies(<138 copies/mL). A negative result must be combined with clinical observations, patient history, and epidemiological information. The expected result is Negative.  Fact Sheet for Patients:  EntrepreneurPulse.com.au  Fact Sheet for Healthcare Providers:  IncredibleEmployment.be  This test is no t yet approved or cleared by the [place] FDA and  has been authorized for detection and/or diagnosis of SARS-CoV-2 by FDA under an Emergency Use Authorization (EUA). This EUA will remain  in effect (meaning this test can be used) for the duration of the COVID-19 declaration under Section 564(b)(1) of the Act, 21 U.S.C.section 360bbb-3(b)(1), unless the authorization is terminated  or revoked sooner.       Influenza A by PCR NEGATIVE NEGATIVE Final   Influenza B by PCR NEGATIVE NEGATIVE Final    Comment: (NOTE) The Xpert Xpress SARS-CoV-2/FLU/RSV plus assay is intended as an aid in the diagnosis of influenza from Nasopharyngeal swab specimens and should not be used as a sole basis for treatment. Nasal washings and aspirates are unacceptable for Xpert Xpress SARS-CoV-2/FLU/RSV testing.  Fact Sheet for  Patients: EntrepreneurPulse.com.au  Fact Sheet for Healthcare Providers: IncredibleEmployment.be  This test is not yet approved or cleared by the [place] FDA and has been authorized for detection and/or diagnosis of SARS-CoV-2 by FDA under an Emergency Use Authorization (EUA). This EUA will remain in effect (meaning this test can be used) for the duration of the COVID-19 declaration under Section 564(b)(1) of the Act, 21 U.S.C. section 360bbb-3(b)(1), unless the authorization is terminated or revoked.  Performed at [facility], [place] [address], [place], [place] [address]   Surgical pcr screen     Status: None   Collection Time: 02/06/21  1:51 AM   Specimen: Nasal Mucosa; Nasal Swab  Result Value Ref Range Status   MRSA, PCR NEGATIVE NEGATIVE Final   Staphylococcus aureus NEGATIVE NEGATIVE Final    Comment: (NOTE) The Xpert SA Assay (FDA approved for NASAL specimens in patients 56 years of age and older), is one component of a comprehensive surveillance program. It is not intended to diagnose infection nor to guide or monitor treatment. Performed at [facility], [place] [address], [place], [place] [address]   Culture, blood (Routine X 2) w Reflex to ID Panel     Status: None   Collection Time: 02/06/21  5:29 AM   Specimen: BLOOD RIGHT HAND  Result Value Ref Range Status   Specimen Description BLOOD RIGHT HAND  Final   Special Requests   Final    BOTTLES DRAWN AEROBIC AND ANAEROBIC Blood Culture adequate volume   Culture   Final    NO GROWTH 5 DAYS Performed at [facility], [place] [address] [place], [place] [address]    Report Status 02/11/2021 FINAL  Final  Aerobic/Anaerobic Culture w Gram Stain (surgical/deep wound)     Status: None (Preliminary result)   Collection Time: 02/06/21  9:47 AM   Specimen: PATH Other; Tissue  Result Value Ref Range Status   Specimen Description TISSUE  Final   Special Requests LEFT  HIP ABSC SPEC A  Final   Gram Stain   Final    FEW WBC PRESENT,BOTH PMN AND MONONUCLEAR MODERATE GRAM POSITIVE COCCI RARE GRAM NEGATIVE RODS    Culture   Final    ABUNDANT ESCHERICHIA COLI ABUNDANT ENTEROCOCCUS FAECALIS MODERATE ACINETOBACTER CALCOACETICUS/BAUMANNII COMPLEX MODERATE BACTEROIDES THETAIOTAOMICRON BETA LACTAMASE POSITIVE CULTURE REINCUBATED FOR BETTER GROWTH Performed at [facility], [place] [address] [place], [place] [address]    Report Status PENDING  Incomplete   Organism ID, Bacteria ESCHERICHIA COLI  Final   Organism ID, Bacteria ENTEROCOCCUS FAECALIS  Final   Organism ID, Bacteria ACINETOBACTER CALCOACETICUS/BAUMANNII COMPLEX  Final      Susceptibility   Acinetobacter calcoaceticus/baumannii complex - MIC*    CEFTAZIDIME >=64 RESISTANT Resistant     CIPROFLOXACIN >=4 RESISTANT Resistant     GENTAMICIN <=1 SENSITIVE Sensitive  IMIPENEM >=16 RESISTANT Resistant     PIP/TAZO >=128 RESISTANT Resistant     TRIMETH/SULFA >=320 RESISTANT Resistant     AMPICILLIN/SULBACTAM 8 SENSITIVE Sensitive     * MODERATE ACINETOBACTER CALCOACETICUS/BAUMANNII COMPLEX   Escherichia coli - MIC*    AMPICILLIN >=32 RESISTANT Resistant     CEFAZOLIN <=4 SENSITIVE Sensitive     CEFEPIME <=0.12 SENSITIVE Sensitive     CEFTAZIDIME <=1 SENSITIVE Sensitive     CEFTRIAXONE <=0.25 SENSITIVE Sensitive     CIPROFLOXACIN <=0.25 SENSITIVE Sensitive     GENTAMICIN <=1 SENSITIVE Sensitive     IMIPENEM <=0.25 SENSITIVE Sensitive     TRIMETH/SULFA >=320 RESISTANT Resistant     AMPICILLIN/SULBACTAM 8 SENSITIVE Sensitive     PIP/TAZO <=4 SENSITIVE Sensitive     * ABUNDANT ESCHERICHIA COLI   Enterococcus faecalis - MIC*    AMPICILLIN <=2 SENSITIVE Sensitive     VANCOMYCIN 1 SENSITIVE Sensitive     GENTAMICIN SYNERGY RESISTANT Resistant     * ABUNDANT ENTEROCOCCUS [place] [place] [facility] [phone] pager   02/13/2021, 3:39 PM

## 2021-02-13 NOTE — Discharge Summary (Signed)
DISCHARGE SUMMARY  CAMESHIA CRESSMAN  MR#: [id number]  DOB:1946/05/03  Date of Admission: 02/05/2021 Date of Discharge: 02/13/2021  Attending Physician:Eero Dini Hennie Duos, MD  Patient's TMH:[id number], Annie Main, MD  Consults: ID Orthopedic Surgery   Disposition: D/C to CIR   Follow-up Appts: to be determined at time of d/c from CIR   Tests Needing Follow-up: -adjust abx as directed by ID service -VAC care / wound care per Orthopedics  -resume chronic anticoagulation for afib when wound has improved enough to make this safe   Discharge Diagnoses: Infected thigh wound hematoma - E coli + E faecalis + Acinetobacter Acute blood loss anemia - Normocytic anemia Chronic atrial fibrillation on Eliquis Chronic profound orthostatic hypotension Hyperlipidemia Hypothyroidism Obesity - Body mass index is 33.64 kg/m. History of non-Hodgkin's lymphoma  Initial presentation: 75yo with a history of hypothyroidism, Non-Hodgkin's lymphoma with SVC syndrome, atrial fibrillation on chronic Eliquis, status post pacemaker placement, obesity status post lap band surgery, HTN, and HLD who presented secondary to a recurrent hematoma/wound infection related to a prior I&D for a hematoma following a fall on anticoag.  Orthopedic Surgery was consulted and ultimately took the patient to the OR for repeat I&D 5/17.  Hospital Course: 3/29 evacuation of hematoma L hip 12/19/20 after fall on anticoag (orthostatic) 4/1 D/C to CIR  4/19 D/C from CIR  5/16 sent to ED by PCP for wound d/c 5/17 I&D in OR   5/24 d/c to CIR   Infected thigh wound hematoma - E coli + E faecalis + Acinetobacter Status post repeat I&D 5/17 - wound care per Orthopedics - abx adjusted again 5/22 based on new cx data -clinically progressing nicely - wound VAC to be placed by orthopedics on date of transfer to CIR   Acute blood loss anemia - Normocytic anemia Due to above - hemoglobin trended down due to suspected ongoing bleeding  into left hip region - transfused 1 unit 5/19 -follow-up hemoglobin stable off anticoag - no evidence of gross blood loss at present - discussed at length w/ pt the pros/cons of holding anticoag, and we both agree with holding anticoag until the wound heals further   Chronic atrial fibrillation on Eliquis Rate controlled - Eliquis on hold due to above - continue amiodarone - followed by Dr. Caryl Comes - CHA2DS2-VASc is 6 - see discussion above regarding anticoag  Chronic profound orthostatic hypotension Continue usual midodrine and Florinef - to be monitored in CIR   Hyperlipidemia Continue usual Crestor  Hypothyroidism Continue usual Synthroid  Obesity - Body mass index is 33.64 kg/m.  History of non-Hodgkin's lymphoma Currently in remission   D/C Medications D/C medications to be finalized at time of D/C from CIR   No current facility-administered medications for this encounter. No current outpatient medications on file.  Facility-Administered Medications Ordered in Other Encounters:  .  acetaminophen (TYLENOL) tablet 650 mg, 650 mg, Oral, Q6H PRN, Angiulli, Lavon Paganini, PA-C .  [START ON 02/14/2021] amiodarone (PACERONE) tablet 200 mg, 200 mg, Oral, Daily, Angiulli, Lavon Paganini, PA-C .  Ampicillin-Sulbactam (UNASYN) 3 g in sodium chloride 0.9 % 100 mL IVPB, 3 g, Intravenous, Q6H, Tommy Medal, Lavell Islam, MD .  bisacodyl (DULCOLAX) suppository 10 mg, 10 mg, Rectal, Daily PRN, Angiulli, Lavon Paganini, PA-C .  docusate sodium (COLACE) capsule 100 mg, 100 mg, Oral, BID, Angiulli, Lavon Paganini, PA-C .  fludrocortisone (FLORINEF) tablet 0.05 mg, 0.05 mg, Oral, BID WC, Angiulli, Lavon Paganini, PA-C .  HYDROcodone-acetaminophen (NORCO/VICODIN) 5-325 MG per tablet 1-2 tablet, 1-2  tablet, Oral, Q4H PRN, Angiulli, Lavon Paganini, PA-C .  lactulose (CHRONULAC) 10 GM/15ML solution 30 g, 30 g, Oral, Daily PRN, Angiulli, Lavon Paganini, PA-C .  [START ON 02/14/2021] levothyroxine (SYNTHROID) tablet 137 mcg, 137 mcg, Oral, Once  per day on Sun Mon Tue Wed Thu Fri, Angiulli, Daniel J, PA-C .  Derrill Memo ON 02/17/2021] levothyroxine (SYNTHROID) tablet 68.5 mcg, 68.5 mcg, Oral, Once per day on Sat, Angiulli, Daniel J, PA-C .  midodrine (PROAMATINE) tablet 5 mg, 5 mg, Oral, BID WC, Angiulli, Daniel J, PA-C .  ondansetron (ZOFRAN) tablet 4 mg, 4 mg, Oral, Q6H PRN **OR** ondansetron (ZOFRAN) injection 4 mg, 4 mg, Intravenous, Q6H PRN, Angiulli, Lavon Paganini, PA-C .  polyethylene glycol (MIRALAX / GLYCOLAX) packet 17 g, 17 g, Oral, BID, Angiulli, Lavon Paganini, PA-C .  [START ON 02/15/2021] rosuvastatin (CRESTOR) tablet 10 mg, 10 mg, Oral, Once per day on Mon Thu, Angiulli, Daniel J, PA-C .  traZODone (DESYREL) tablet 25 mg, 25 mg, Oral, QHS PRN, Angiulli, Lavon Paganini, PA-C   Day of Discharge BP 139/76 (BP Location: Left Arm)   Pulse 76   Temp 97.7 F (36.5 C) (Oral)   Resp 18   Ht 5\' 6"  (1.676 m)   Wt 94.5 kg   SpO2 93%   BMI 33.64 kg/m   Physical Exam: General: No acute respiratory distress Lungs: Clear to auscultation bilaterally without wheezes or crackles Cardiovascular: Regular rate without murmur gallop or rub normal S1 and S2 Abdomen: Nontender, nondistended, soft, bowel sounds positive, no rebound Extremities: 1+ stable B LE edema - L thigh/hip wound dressed w/ VAC   Basic Metabolic Panel: Recent Labs  Lab 02/09/21 0149 02/10/21 0124  NA 138 137  K 4.6 4.5  CL 108 107  CO2 24 23  GLUCOSE 110* 94  BUN 22 15  CREATININE 1.13* 0.93  CALCIUM 7.9* 7.7*  MG  --  1.7    Liver Function Tests: Recent Labs  Lab 02/10/21 0124  AST 19  ALT 9  ALKPHOS 55  BILITOT 0.6  PROT 4.2*  ALBUMIN 1.9*    Coags: Recent Labs  Lab 02/09/21 0149  INR 1.2    CBC: Recent Labs  Lab 02/08/21 0331 02/09/21 0149 02/10/21 0124 02/11/21 0154  WBC 11.3* 5.1 8.2 8.9  HGB 7.2* 8.1* 8.3* 8.1*  HCT 22.9* 25.5* 26.9* 26.1*  MCV 98.7 96.2 98.2 96.0  PLT 249 PLATELET CLUMPS NOTED ON SMEAR, UNABLE TO ESTIMATE 251 255     Recent Labs    01/07/21 0502  BNP 157.0*    Recent Results (from the past 240 hour(s))  Blood culture (routine x 2)     Status: None   Collection Time: 02/05/21  2:58 PM   Specimen: BLOOD LEFT ARM  Result Value Ref Range Status   Specimen Description BLOOD LEFT ARM  Final   Special Requests   Final    BOTTLES DRAWN AEROBIC AND ANAEROBIC Blood Culture adequate volume   Culture   Final    NO GROWTH 6 DAYS Performed at [facility], [address] [place], [place] [address]    Report Status 02/11/2021 FINAL  Final  Aerobic Culture w Gram Stain (superficial specimen)     Status: None   Collection Time: 02/05/21  3:38 PM   Specimen: Wound  Result Value Ref Range Status   Specimen Description WOUND  Final   Special Requests NONE  Final   Gram Stain   Final    ABUNDANT WBC  PRESENT, PREDOMINANTLY PMN ABUNDANT GRAM NEGATIVE RODS MODERATE GRAM POSITIVE COCCI Performed at [facility], [place] [address] [place], [place] [address]    Culture   Final    MODERATE ESCHERICHIA COLI MODERATE ENTEROCOCCUS FAECALIS    Report Status 02/09/2021 FINAL  Final   Organism ID, Bacteria ESCHERICHIA COLI  Final   Organism ID, Bacteria ENTEROCOCCUS FAECALIS  Final      Susceptibility   Escherichia coli - MIC*    AMPICILLIN >=32 RESISTANT Resistant     CEFAZOLIN <=4 SENSITIVE Sensitive     CEFEPIME <=0.12 SENSITIVE Sensitive     CEFTAZIDIME <=1 SENSITIVE Sensitive     CEFTRIAXONE <=0.25 SENSITIVE Sensitive     CIPROFLOXACIN <=0.25 SENSITIVE Sensitive     GENTAMICIN <=1 SENSITIVE Sensitive     IMIPENEM <=0.25 SENSITIVE Sensitive     TRIMETH/SULFA >=320 RESISTANT Resistant     AMPICILLIN/SULBACTAM 16 INTERMEDIATE Intermediate     PIP/TAZO <=4 SENSITIVE Sensitive     * MODERATE ESCHERICHIA COLI   Enterococcus faecalis - MIC*    AMPICILLIN <=2 SENSITIVE Sensitive     VANCOMYCIN 1 SENSITIVE Sensitive     GENTAMICIN SYNERGY RESISTANT Resistant     * MODERATE ENTEROCOCCUS FAECALIS   Resp Panel by RT-PCR (Flu A&B, Covid) Nasopharyngeal Swab     Status: None   Collection Time: 02/05/21  4:00 PM   Specimen: Nasopharyngeal Swab; Nasopharyngeal(NP) swabs in vial transport medium  Result Value Ref Range Status   SARS Coronavirus 2 by RT PCR NEGATIVE NEGATIVE Final    Comment: (NOTE) SARS-CoV-2 target nucleic acids are NOT DETECTED.  The SARS-CoV-2 RNA is generally detectable in upper respiratory specimens during the acute phase of infection. The lowest concentration of SARS-CoV-2 viral copies this assay can detect is 138 copies/mL. A negative result does not preclude SARS-Cov-2 infection and should not be used as the sole basis for treatment or other patient management decisions. A negative result may occur with  improper specimen collection/handling, submission of specimen other than nasopharyngeal swab, presence of viral mutation(s) within the areas targeted by this assay, and inadequate number of viral copies(<138 copies/mL). A negative result must be combined with clinical observations, patient history, and epidemiological information. The expected result is Negative.  Fact Sheet for Patients:  EntrepreneurPulse.com.au  Fact Sheet for Healthcare Providers:  IncredibleEmployment.be  This test is no t yet approved or cleared by the [place] FDA and  has been authorized for detection and/or diagnosis of SARS-CoV-2 by FDA under an Emergency Use Authorization (EUA). This EUA will remain  in effect (meaning this test can be used) for the duration of the COVID-19 declaration under Section 564(b)(1) of the Act, 21 U.S.C.section 360bbb-3(b)(1), unless the authorization is terminated  or revoked sooner.       Influenza A by PCR NEGATIVE NEGATIVE Final   Influenza B by PCR NEGATIVE NEGATIVE Final    Comment: (NOTE) The Xpert Xpress SARS-CoV-2/FLU/RSV plus assay is intended as an aid in the diagnosis of influenza from  Nasopharyngeal swab specimens and should not be used as a sole basis for treatment. Nasal washings and aspirates are unacceptable for Xpert Xpress SARS-CoV-2/FLU/RSV testing.  Fact Sheet for Patients: EntrepreneurPulse.com.au  Fact Sheet for Healthcare Providers: IncredibleEmployment.be  This test is not yet approved or cleared by the [place] FDA and has been authorized for detection and/or diagnosis of SARS-CoV-2 by FDA under an Emergency Use Authorization (EUA). This EUA will remain in effect (meaning this test can be used) for  the duration of the COVID-19 declaration under Section 564(b)(1) of the Act, 21 U.S.C. section 360bbb-3(b)(1), unless the authorization is terminated or revoked.  Performed at [facility], [place] [address], [place], [place] [address]   Surgical pcr screen     Status: None   Collection Time: 02/06/21  1:51 AM   Specimen: Nasal Mucosa; Nasal Swab  Result Value Ref Range Status   MRSA, PCR NEGATIVE NEGATIVE Final   Staphylococcus aureus NEGATIVE NEGATIVE Final    Comment: (NOTE) The Xpert SA Assay (FDA approved for NASAL specimens in patients 92 years of age and older), is one component of a comprehensive surveillance program. It is not intended to diagnose infection nor to guide or monitor treatment. Performed at [facility], [place] [address], [place], [place] [address]   Culture, blood (Routine X 2) w Reflex to ID Panel     Status: None   Collection Time: 02/06/21  5:29 AM   Specimen: BLOOD RIGHT HAND  Result Value Ref Range Status   Specimen Description BLOOD RIGHT HAND  Final   Special Requests   Final    BOTTLES DRAWN AEROBIC AND ANAEROBIC Blood Culture adequate volume   Culture   Final    NO GROWTH 5 DAYS Performed at [facility], [place] [address], [place], [place] [address]    Report Status 02/11/2021 FINAL  Final  Aerobic/Anaerobic Culture w Gram Stain (surgical/deep wound)      Status: None   Collection Time: 02/06/21  9:47 AM   Specimen: PATH Other; Tissue  Result Value Ref Range Status   Specimen Description TISSUE  Final   Special Requests LEFT HIP ABSC SPEC A  Final   Gram Stain   Final    FEW WBC PRESENT,BOTH PMN AND MONONUCLEAR MODERATE GRAM POSITIVE COCCI RARE GRAM NEGATIVE RODS    Culture   Final    ABUNDANT ESCHERICHIA COLI ABUNDANT ENTEROCOCCUS FAECALIS MODERATE ACINETOBACTER CALCOACETICUS/BAUMANNII COMPLEX MODERATE BACTEROIDES THETAIOTAOMICRON BETA LACTAMASE POSITIVE Performed at [facility], [place] [address] [place], [place] [address]    Report Status 02/11/2021 FINAL  Final   Organism ID, Bacteria ESCHERICHIA COLI  Final   Organism ID, Bacteria ENTEROCOCCUS FAECALIS  Final   Organism ID, Bacteria ACINETOBACTER CALCOACETICUS/BAUMANNII COMPLEX  Final      Susceptibility   Acinetobacter calcoaceticus/baumannii complex - MIC*    CEFTAZIDIME >=64 RESISTANT Resistant     CIPROFLOXACIN >=4 RESISTANT Resistant     GENTAMICIN <=1 SENSITIVE Sensitive     IMIPENEM >=16 RESISTANT Resistant     PIP/TAZO >=128 RESISTANT Resistant     TRIMETH/SULFA >=320 RESISTANT Resistant     AMPICILLIN/SULBACTAM 8 SENSITIVE Sensitive     * MODERATE ACINETOBACTER CALCOACETICUS/BAUMANNII COMPLEX   Escherichia coli - MIC*    AMPICILLIN >=32 RESISTANT Resistant     CEFAZOLIN <=4 SENSITIVE Sensitive     CEFEPIME <=0.12 SENSITIVE Sensitive     CEFTAZIDIME <=1 SENSITIVE Sensitive     CEFTRIAXONE <=0.25 SENSITIVE Sensitive     CIPROFLOXACIN <=0.25 SENSITIVE Sensitive     GENTAMICIN <=1 SENSITIVE Sensitive     IMIPENEM <=0.25 SENSITIVE Sensitive     TRIMETH/SULFA >=320 RESISTANT Resistant     AMPICILLIN/SULBACTAM 8 SENSITIVE Sensitive     PIP/TAZO <=4 SENSITIVE Sensitive     * ABUNDANT ESCHERICHIA COLI   Enterococcus faecalis - MIC*    AMPICILLIN <=2 SENSITIVE Sensitive     VANCOMYCIN 1 SENSITIVE Sensitive     GENTAMICIN SYNERGY RESISTANT Resistant     *  ABUNDANT ENTEROCOCCUS FAECALIS      Time spent in discharge (includes decision making & examination of pt): 35 minutes  02/13/2021, 11:11 AM   Cherene Altes, MD [facility] Office  [phone]

## 2021-02-13 NOTE — PMR Pre-admission (Signed)
PMR Admission Coordinator Pre-Admission Assessment  Patient: Kayla Harrison is an 75 y.o., female MRN: [id number] DOB: [birth year] Height: $RemoveBeforeDE'5\' 6"'[id number]$  (167.6 cm) Weight: 94.5 kg  Insurance Information HMO:     PPO:      PCP:      IPA:      80/20:      OTHER:  PRIMARY: Medicare a and b      Policy#: [id number]      Subscriber: pt Benefits:  Phone #: passport one online     Name: 5/23 Eff. Date: 05/24/2009     Deduct: $1556      Out of Pocket Max: none      Life Max: none CIR: 100%      SNF: 20 full days Outpatient: 80%     Co-Pay: [place]: 1005      Co-Pay: none DME: 80%     Co-Pay: 20% Providers: pt choice  SECONDARY: [employer]      Policy#: [id number]  Financial Counselor:       Phone#:   The "Data Collection Information Summary" for patients in Inpatient Rehabilitation Facilities with attached "Privacy Act [place] Records" was provided and verbally reviewed with: Patient  Emergency Contact Information Contact Information    Name Relation Home Work Mobile   Slater Sister   [phone]   Smith,[name] [place], [place]   [phone]      Current Medical History  Patient Admitting Diagnosis: Debility  History of Present Illness:  75 year old right-handed female with history of thyroid cancer, hypothyroidism, NHL/SVC syndrome /lung cancer with radiation therapy as well as chemotherapy 2018 followed by Dr. Lindi Adie, pacemaker placement for atrial fibrillation maintained on Eliquis followed by Dr. Caryl Comes, obesity status post lap band surgery, hypertension, hyperlipidemia, obesity with BMI 33.64, recurrent falls.  Recent mission CIR 12/22/2020 to 01/09/2021 for functional deficits related to left coronoid process fracture and left hip labral hamstring tendon tear with associated large hematoma requiring I&D and wound VAC.  She returned to the ER 01/10/2021 for open wound to left thigh that were bleeding from prior hematoma I&D site  which was not actively bleeding and hemoglobin stable she was discharged back to home.  Presented 02/05/2021 with purulent malodorous drainage x1 week from left hip wound dehiscence.  Admission chemistries unremarkable aside glucose 103, INR 1.6, lactic acid 1.1, blood cultures no growth to date, hemoglobin 11.2, WBC 8000.  Patient underwent incision and drainage of left thigh deep abscess/hematoma excisional debridement of necrotic skin subcutaneous tissue muscle and fascia with revision of closure/drain placement 02/06/2021 per Dr. Melony Overly and wound VAC was placed.  Wound cultures after I&D E. coli plus E Faecalis and currently maintained on Unasyn.  It was noted to the central part of her wound was a bit dehisced no longer held tightly with suture material consideration being made for revision with washout.  Acute blood loss anemia 8.1 and monitored.  Sedimentation rate mildly elevated 45.  Anticoagulation with chronic Eliquis for atrial fibrillation currently on hold and does continue on amiodarone at this time.  Close monitoring of blood pressure remaining on ProAmatine as well as Florinef.     Patient's medical record from [facility] has been reviewed by the rehabilitation admission coordinator and physician.  Past Medical History  Past Medical History:  Diagnosis Date  . Anemia   . Arthritis    osteoarthritis - knees and right shoulder  . Blood transfusion without reported  diagnosis   . Breast cancer ([place])    Dr Margot Chimes, total thyroidectomy- 1999- for cancer  . 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 Chronic bilateral pleural effusions   . Colon polyp    Dr Earlean Shawl  . Complication of anesthesia    Ketamine produces LSD reaction, bright colored nightmarish experience   . Dyslipidemia   . Endometriosis   . Fibroids   . H/O pleural effusion    s/p thoracentesis w 3256ml withdrawn  . Hepatitis    [place] ?hepatitis   . History of dysphagia    due to  radiation therapy  . History of hiatal hernia    small noted on PET scan  . Hypertension   . Hypothyroidism   . Lung cancer, lower lobe ([place]) 01/2017   radiation RX completed 03/04/17; will start chemo 6/27, pt unaware of lung cancer  . Morbid obesity ([place])    Status post lap band surgery  . Nephrolithiasis   . Non Hodgkin's lymphoma ([place])    on chemotherapy  . Persistent atrial fibrillation ([place])    a. s/p PVI 2008 b. s/p convergent ablation 3568 complicated by bradycardia requiring pacemaker implant  . Personal history of radiation therapy   . Presence of permanent cardiac pacemaker   . Rotator cuff tear    Right  . Stroke [facility])    2003- [place] x2  . SVC syndrome    with lung mass and non hodgkins lymphoma  . Thyroid cancer ([place]) 2000    Family History   family history includes Colon cancer in her father; Diabetes in her brother, paternal aunt, paternal grandmother, and sister; Heart attack in her father; Heart disease (age of onset: 17) in her father; Other in her mother.  Prior Rehab/Hospitalizations Has the patient had prior rehab or hospitalizations prior to admission? Yes CIR 4/22  Has the patient had major surgery during 100 days prior to admission? Yes   Current Medications  Current Facility-Administered Medications:  .  acetaminophen (TYLENOL) tablet 650 mg, 650 mg, Oral, Q6H PRN, 650 mg at 02/13/21 0555 **OR** [DISCONTINUED] acetaminophen (TYLENOL) suppository 650 mg, 650 mg, Rectal, Q6H PRN, Erle Crocker, MD .  amiodarone (PACERONE) tablet 200 mg, 200 mg, Oral, Daily, Erle Crocker, MD, 200 mg at 02/13/21 1017 .  Ampicillin-Sulbactam (UNASYN) 3 g in sodium chloride 0.9 % 100 mL IVPB, 3 g, Intravenous, Q6H, Cherene Altes, MD, Held at 02/13/21 1018 .  bisacodyl (DULCOLAX) suppository 10 mg, 10 mg, Rectal, Daily PRN, Cherene Altes, MD .  docusate sodium (COLACE) capsule 100 mg, 100 mg, Oral, BID, Erle Crocker, MD, 100 mg at 02/13/21  1017 .  fludrocortisone (FLORINEF) tablet 0.05 mg, 0.05 mg, Oral, BID WC, Erle Crocker, MD, 0.05 mg at 02/13/21 6168 .  hydrALAZINE (APRESOLINE) injection 5 mg, 5 mg, Intravenous, Q4H PRN, Erle Crocker, MD .  HYDROcodone-acetaminophen (NORCO/VICODIN) 5-325 MG per tablet 1-2 tablet, 1-2 tablet, Oral, Q4H PRN, Erle Crocker, MD, 2 tablet at 02/08/21 1859 .  lactulose (CHRONULAC) 10 GM/15ML solution 30 g, 30 g, Oral, Daily PRN, Cherene Altes, MD .  levothyroxine (SYNTHROID) tablet 137 mcg, 137 mcg, Oral, Once per day on Sun Mon Tue Wed Thu Fri, [place], Christopher R, MD, 137 mcg at 02/13/21 0555 .  levothyroxine (SYNTHROID) tablet 68.5 mcg, 68.5 mcg, Oral, Once per day on Sat, Erle Crocker, MD, 68.5 mcg at 02/10/21 0600 .  midodrine (PROAMATINE) tablet 5 mg, 5  mg, Oral, BID WC, Erle Crocker, MD, 5 mg at 02/13/21 3762 .  morphine 2 MG/ML injection 2 mg, 2 mg, Intravenous, Q2H PRN, Erle Crocker, MD, 2 mg at 02/09/21 0525 .  ondansetron (ZOFRAN) tablet 4 mg, 4 mg, Oral, Q6H PRN **OR** ondansetron (ZOFRAN) injection 4 mg, 4 mg, Intravenous, Q6H PRN, Erle Crocker, MD, 4 mg at 02/10/21 1329 .  polyethylene glycol (MIRALAX / GLYCOLAX) packet 17 g, 17 g, Oral, BID, Joette Catching T, MD .  rosuvastatin (CRESTOR) tablet 10 mg, 10 mg, Oral, Once per day on Mon Thu, [place], Christopher R, MD, 10 mg at 02/12/21 8315 .  traZODone (DESYREL) tablet 25 mg, 25 mg, Oral, QHS PRN, Erle Crocker, MD  Patients Current Diet:  Diet Order            Diet regular Room service appropriate? Yes; Fluid consistency: Thin  Diet effective now                 Precautions / Restrictions Precautions Precautions: Fall Precaution Comments: has hx of orthostatic hypotension Restrictions Weight Bearing Restrictions: No LLE Weight Bearing: Weight bearing as tolerated   Has the patient had 2 or more falls or a fall with injury in the past year? Yes  Prior  Activity Level Limited Community (1-2x/wk): Mod I with RW; recent d/c form CIR 4/19  Prior Functional Level Self Care: Did the patient need help bathing, dressing, using the toilet or eating? Independent  Indoor Mobility: Did the patient need assistance with walking from room to room (with or without device)? Independent  Stairs: Did the patient need assistance with internal or external stairs (with or without device)? Independent  Functional Cognition: Did the patient need help planning regular tasks such as shopping or remembering to take medications? Independent  Home Assistive Devices / Equipment Home Equipment: Walker - 4 wheels,Shower seat - built in,Grab bars - tub/shower,Grab bars - toilet,Cane - single point  Prior Device Use: Indicate devices/aids used by the patient prior to current illness, exacerbation or injury? Walker  Current Functional Level Cognition  Overall Cognitive Status: Within Functional Limits for tasks assessed Orientation Level: Oriented X4 General Comments: Aware ofcurrent situation and anxious about wound vac insertion now realizing that she is going to need more assist at home.    Extremity Assessment (includes Sensation/Coordination)  Upper Extremity Assessment: Overall WFL for tasks assessed,LUE deficits/detail LUE Deficits / Details: swelling from LUE IV  Lower Extremity Assessment: LLE deficits/detail LLE Deficits / Details: Increased swelling in LLE. L thigh wrapped where incision is. LLE Sensation: WNL LLE Coordination: decreased gross motor    ADLs  Overall ADL's : Needs assistance/impaired Eating/Feeding: Independent,Sitting Grooming: Wash/dry hands,Wash/dry face,Oral care,Min guard,Standing Grooming Details (indicate cue type and reason): Pt reporting no dizziness in standing today; able to stand x4 mins for grooming task Upper Body Bathing: Set up,Modified independent,Sitting Lower Body Bathing: Moderate assistance,Sitting/lateral  leans,Sit to/from stand Upper Body Dressing : Set up,Modified independent,Sitting Lower Body Dressing: Moderate assistance,Sitting/lateral leans,Sit to/from stand Toilet Transfer: Min guard,Cueing for sequencing,Cueing for safety,Ambulation,Comfort height toilet,Grab bars,RW Armed forces technical officer Details (indicate cue type and reason): cues for RW safety management Toileting- Clothing Manipulation and Hygiene: Min guard,Cueing for sequencing,Cueing for safety,Sitting/lateral lean,Sit to/from stand Toileting - Water quality scientist Details (indicate cue type and reason): pericare in standing; LOB episodes in standing, but able to right reactions Tub/ Banker:  (not assessed) Functional mobility during ADLs: Min guard,Cueing for safety,Cueing for sequencing,Rolling walker General ADL Comments: Pt education  for increasing independence by going over mobility and education for ADL performance at EOB and at sink. Pt with LOB episodes and inability to properly care for self; fatigue after going to bathroom and at home, pt would be limited to what she could do by herself as family is in out of town and unable to assist daily.    Mobility  Overal bed mobility: Needs Assistance Bed Mobility: Sit to Supine,Supine to Sit Rolling: Modified independent (Device/Increase time) (with bed rails) Supine to sit: Min guard Sit to supine: Min guard General bed mobility comments: in bed pre and post visit    Transfers  Overall transfer level: Needs assistance Equipment used: Rolling walker (2 wheeled) Transfers: Sit to/from Stand Sit to Stand: Min guard Stand pivot transfers: Min guard General transfer comment: [email or web address]    Ambulation / Gait / Stairs / Emergency planning/management officer  Ambulation/Gait Ambulation/Gait assistance: Herbalist (Feet): 50 Feet (x2) Assistive device: Rolling walker (2 wheeled) Gait Pattern/deviations: Step-through pattern,Decreased stride length,Trunk flexed General Gait  Details: pt very dependent on RW, verbal cues for walker management, pt requiring seated rest break Gait velocity: slow Gait velocity interpretation: <1.31 ft/sec, indicative of household ambulator    Posture / Balance Balance Overall balance assessment: Needs assistance Sitting-balance support: No upper extremity supported,Feet supported Sitting balance-Leahy Scale: Good Standing balance support: Bilateral upper extremity supported,During functional activity Standing balance-Leahy Scale: Fair Standing balance comment: Reliant on UE support    Special needs/care consideration Wound VAC to be placed by Dr Lucia Gaskins 5/24       ID to be consulted and PICC placed 5/24 Previous Home Environment  Living Arrangements: Alone  Lives With: Alone Available Help at Discharge: Family,Available PRN/intermittently Type of Home: Other(Comment) Home Layout: One level Home Access: Level entry Bathroom Shower/Tub: Multimedia programmer: Handicapped height Bathroom Accessibility: Yes [place]: Yes Type of Home Care Services: [place] (if known): [place]  Discharge Living Setting Plans for Discharge Living Setting: Patient's home,Alone,Other (Comment) (condo) Type of Home at Discharge: Other (Comment) (condo) Discharge Home Layout: One level Discharge Home Access: Level entry Discharge Bathroom Shower/Tub: Walk-in shower Discharge Bathroom Toilet: Handicapped height Discharge Bathroom Accessibility: Yes How Accessible: Accessible via walker Does the patient have any problems obtaining your medications?: No  Social/Family/Support Systems Contact Information: sister, [name], in [place] Anticipated Caregiver: family , friends intermittenttly Anticipated Ambulance person Information: see above Caregiver Availability: Intermittent Discharge Plan Discussed with Primary Caregiver: Yes Is Caregiver In Agreement with Plan?: Yes Does Caregiver/Family have Issues  with Lodging/Transportation while Pt is in Rehab?: No  Goals Patient/Family Goal for Rehab: Mod I to supervision with PT and OT Expected length of stay: ELOS 7 to 10 days Pt/Family Agrees to Admission and willing to participate: Yes Program Orientation Provided & Reviewed with Pt/Caregiver Including Roles  & Responsibilities: Yes  Decrease burden of Care through IP rehab admission: n/a  Possible need for SNF placement upon discharge: not anticipated  Patient Condition: I have reviewed [facility] , spoken with CM, and patient. I met with patient at the bedside for inpatient rehabilitation assessment.  Patient will benefit from ongoing PT and OT, can actively participate in 3 hours of therapy a day 5 days of the week, and can make measurable gains during the admission.  Patient will also benefit from the coordinated team approach during an Inpatient Acute Rehabilitation admission.  The patient will receive intensive therapy as well as Rehabilitation physician,  nursing, Education officer, museum, and care management interventions.  Due to bladder management, bowel management, safety, skin/wound care, disease management, medication administration, pain management and patient education the patient requires 24 hour a day rehabilitation nursing.  The patient is currently min assist overall with mobility and basic ADLs.  Discharge setting and therapy post discharge at home with home health is anticipated.  Patient has agreed to participate in the Acute Inpatient Rehabilitation Program and will admit today.  Preadmission Screen Completed By:  Cleatrice Burke, 02/13/2021 10:43 AM ______________________________________________________________________   Discussed status with Dr. Naaman Plummer on  02/13/2021 at  1049 and received approval for admission today.  Admission Coordinator:  Cleatrice Burke, RN, time  3329 Date  02/13/2021   Assessment/Plan: Diagnosis: Debility after left  thigh wound dehiscence 1. Does the need for close, 24 hr/day Medical supervision in concert with the patient's rehab needs make it unreasonable for this patient to be served in a less intensive setting? Yes 2. Co-Morbidities requiring supervision/potential complications: ID considerations, wound care, pain mgt 3. Due to bladder management, bowel management, safety, skin/wound care, disease management, medication administration, pain management and patient education, does the patient require 24 hr/day rehab nursing? Yes 4. Does the patient require coordinated care of a physician, rehab nurse, PT, OT, and SLP to address physical and functional deficits in the context of the above medical diagnosis(es)? Yes Addressing deficits in the following areas: balance, endurance, locomotion, strength, transferring, bowel/bladder control, bathing, dressing, feeding, grooming, toileting and psychosocial support 5. Can the patient actively participate in an intensive therapy program of at least 3 hrs of therapy 5 days a week? Yes 6. The potential for patient to make measurable gains while on inpatient rehab is excellent 7. Anticipated functional outcomes upon discharge from inpatient rehab: modified independent and supervision PT, modified independent and supervision OT, n/a SLP 8. Estimated rehab length of stay to reach the above functional goals is: 7-10 days 9. Anticipated discharge destination: Home 10. Overall Rehab/Functional Prognosis: excellent   MD Signature: Meredith Staggers, MD, [facility] 02/13/2021

## 2021-02-13 NOTE — Progress Notes (Signed)
Meredith Staggers, MD  Physician  Physical Medicine and Rehabilitation  PMR Pre-admission     Signed  Date of Service:  02/13/2021 10:43 AM      Related encounter: ED to Hosp-Admission (Discharged) from 02/05/2021 in [place]          Show:Clear all _0 Manual_1 Template_2 Copied  Added by: _3 Cristina Gong, RN_4 Meredith Staggers, MD   _5 Hover for details  PMR Admission Coordinator Pre-Admission Assessment   Patient: Kayla Harrison is an 75 y.o., female MRN: [id number] DOB: Jun 13, 1946 Height: _6  (167.6 cm) Weight: 94.5 kg   Insurance Information HMO:     PPO:      PCP:      IPA:      80/20:      OTHER:  PRIMARY: Medicare a and b      Policy#: [id number]      Subscriber: pt Benefits:  Phone #: passport one online     Name: 5/23 Eff. Date: 05/24/2009     Deduct: $1556      Out of Pocket Max: none      Life Max: none CIR: 100%      SNF: 20 full days Outpatient: 80%     Co-Pay: [place]: 1005      Co-Pay: none DME: 80%     Co-Pay: 20% Providers: pt choice  SECONDARY: [employer]      Policy#: [id number]   Financial Counselor:       Phone#:    The "Data Collection Information Summary" for patients in Inpatient Rehabilitation Facilities with attached "Privacy Act [facility] Records" was provided and verbally reviewed with: Patient   Emergency Contact Information         Contact Information     Name Relation Home Work Mobile    [place] Sister     [phone]    Smith,[name] [place], [place]     [phone]         Current Medical History  Patient Admitting Diagnosis: Debility   History of Present Illness:  75 year old right-handed female with history of thyroid cancer, hypothyroidism, NHL/SVC syndrome /lung cancer with radiation therapy as well as chemotherapy 2018 followed by Dr. Lindi Adie, pacemaker placement for atrial fibrillation  maintained on Eliquis followed by Dr. Caryl Comes, obesity status post lap band surgery, hypertension, hyperlipidemia, obesity with BMI 33.64, recurrent falls.  Recent mission CIR 12/22/2020 to 01/09/2021 for functional deficits related to left coronoid process fracture and left hip labral hamstring tendon tear with associated large hematoma requiring I&D and wound VAC.  She returned to the ER 01/10/2021 for open wound to left thigh that were bleeding from prior hematoma I&D site which was not actively bleeding and hemoglobin stable she was discharged back to home.  Presented 02/05/2021 with purulent malodorous drainage x1 week from left hip wound dehiscence.  Admission chemistries unremarkable aside glucose 103, INR 1.6, lactic acid 1.1, blood cultures no growth to date, hemoglobin 11.2, WBC 8000.  Patient underwent incision and drainage of left thigh deep abscess/hematoma excisional debridement of necrotic skin subcutaneous tissue muscle and fascia with revision of closure/drain placement 02/06/2021 per Dr. Melony Overly and wound VAC was placed.  Wound cultures after I&D E. coli plus E Faecalis and currently maintained on Unasyn.  It was noted to the central part of her wound was a bit dehisced no longer held  tightly with suture material consideration being made for revision with washout.  Acute blood loss anemia 8.1 and monitored.  Sedimentation rate mildly elevated 45.  Anticoagulation with chronic Eliquis for atrial fibrillation currently on hold and does continue on amiodarone at this time.  Close monitoring of blood pressure remaining on ProAmatine as well as Florinef.      Patient's medical record from [facility] has been reviewed by the rehabilitation admission coordinator and physician.   Past Medical History      Past Medical History:  Diagnosis Date  . Anemia    . Arthritis      osteoarthritis - knees and right shoulder  . Blood transfusion without reported diagnosis    . Breast cancer  ([place])      Dr Margot Chimes, total thyroidectomy- 1999- for cancer  . 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 Chronic bilateral pleural effusions    . Colon polyp      Dr Earlean Shawl  . Complication of anesthesia      Ketamine produces LSD reaction, bright colored nightmarish experience   . Dyslipidemia    . Endometriosis    . Fibroids    . H/O pleural effusion      s/p thoracentesis w 327m withdrawn  . Hepatitis      [place] ?hepatitis   . History of dysphagia      due to radiation therapy  . History of hiatal hernia      small noted on PET scan  . Hypertension    . Hypothyroidism    . Lung cancer, [place] lobe [place] [place]    radiation RX completed 03/04/17; will start chemo 6/27, pt unaware of lung cancer  . Morbid [place] ([place]      Status post lap band surgery  . Nephrolithiasis    . Non Hodgkin's lymphoma ([facility]      on chemotherapy  . Persistent atrial fibrillation ([place]      a. s/p PVI 2008 b. s/p convergent ablation 25885complicated by bradycardia requiring pacemaker implant  . Personal history of radiation therapy    . Presence of permanent cardiac pacemaker    . Rotator cuff tear      Right  . Stroke [place]      [place]  . SVC syndrome      with lung mass and non hodgkins lymphoma  . Thyroid cancer [place]      Family History   family history includes Colon cancer in her father; Diabetes in her brother, paternal aunt, paternal grandmother, and sister; Heart attack in her father; Heart disease (age of onset: 888 in her father; Other in her mother.   Prior Rehab/Hospitalizations Has the patient had prior rehab or hospitalizations prior to admission? Yes CIR 4/22   Has the patient had major surgery during 100 days prior to admission? Yes              Current Medications   Current Facility-Administered Medications:  .  acetaminophen (TYLENOL) tablet 650 mg, 650 mg, Oral, Q6H PRN, 650 mg at 02/13/21 0555 **OR** [DISCONTINUED] acetaminophen  (TYLENOL) suppository 650 mg, 650 mg, Rectal, Q6H PRN, AErle Crocker MD .  amiodarone (PACERONE) tablet 200 mg, 200 mg, Oral, Daily, AErle Crocker MD, 200 mg at 02/13/21 1017 .  Ampicillin-Sulbactam (UNASYN) 3 g in sodium chloride 0.9 % 100 mL IVPB, 3 g, Intravenous, Q6H, MCherene Altes MD, Held at 02/13/21 1018 .  bisacodyl (DULCOLAX)  suppository 10 mg, 10 mg, Rectal, Daily PRN, Cherene Altes, MD .  docusate sodium (COLACE) capsule 100 mg, 100 mg, Oral, BID, Erle Crocker, MD, 100 mg at 02/13/21 1017 .  fludrocortisone (FLORINEF) tablet 0.05 mg, 0.05 mg, Oral, BID WC, Erle Crocker, MD, 0.05 mg at 02/13/21 6606 .  hydrALAZINE (APRESOLINE) injection 5 mg, 5 mg, Intravenous, Q4H PRN, Erle Crocker, MD .  HYDROcodone-acetaminophen (NORCO/VICODIN) 5-325 MG per tablet 1-2 tablet, 1-2 tablet, Oral, Q4H PRN, Erle Crocker, MD, 2 tablet at 02/08/21 1859 .  lactulose (CHRONULAC) 10 GM/15ML solution 30 g, 30 g, Oral, Daily PRN, Cherene Altes, MD .  levothyroxine (SYNTHROID) tablet 137 mcg, 137 mcg, Oral, Once per day on Sun Mon Tue Wed Thu Fri, Adair, Christopher R, MD, 137 mcg at 02/13/21 0555 .  levothyroxine (SYNTHROID) tablet 68.5 mcg, 68.5 mcg, Oral, Once per day on Sat, Erle Crocker, MD, 68.5 mcg at 02/10/21 0600 .  midodrine (PROAMATINE) tablet 5 mg, 5 mg, Oral, BID WC, Erle Crocker, MD, 5 mg at 02/13/21 0826 .  morphine 2 MG/ML injection 2 mg, 2 mg, Intravenous, Q2H PRN, Erle Crocker, MD, 2 mg at 02/09/21 0525 .  ondansetron (ZOFRAN) tablet 4 mg, 4 mg, Oral, Q6H PRN **OR** ondansetron (ZOFRAN) injection 4 mg, 4 mg, Intravenous, Q6H PRN, Erle Crocker, MD, 4 mg at 02/10/21 1329 .  polyethylene glycol (MIRALAX / GLYCOLAX) packet 17 g, 17 g, Oral, BID, Joette Catching T, MD .  rosuvastatin (CRESTOR) tablet 10 mg, 10 mg, Oral, Once per day on Mon Thu, Adair, Christopher R, MD, 10 mg at 02/12/21 3016 .  traZODone  (DESYREL) tablet 25 mg, 25 mg, Oral, QHS PRN, Erle Crocker, MD   Patients Current Diet:     Diet Order                      Diet regular Room service appropriate? Yes; Fluid consistency: Thin  Diet effective now                      Precautions / Restrictions Precautions Precautions: Fall Precaution Comments: has hx of orthostatic hypotension Restrictions Weight Bearing Restrictions: No LLE Weight Bearing: Weight bearing as tolerated    Has the patient had 2 or more falls or a fall with injury in the past year? Yes   Prior Activity Level Limited Community (1-2x/wk): Mod I with RW; recent d/c form CIR 4/19   Prior Functional Level Self Care: Did the patient need help bathing, dressing, using the toilet or eating? Independent   Indoor Mobility: Did the patient need assistance with walking from room to room (with or without device)? Independent   Stairs: Did the patient need assistance with internal or external stairs (with or without device)? Independent   Functional Cognition: Did the patient need help planning regular tasks such as shopping or remembering to take medications? Independent   Home Assistive Devices / Equipment Home Equipment: Walker - 4 wheels,Shower seat - built in,Grab bars - tub/shower,Grab bars - toilet,Cane - single point   Prior Device Use: Indicate devices/aids used by the patient prior to current illness, exacerbation or injury? Walker   Current Functional Level Cognition   Overall Cognitive Status: Within Functional Limits for tasks assessed Orientation Level: Oriented X4 General Comments: Aware ofcurrent situation and anxious about wound vac insertion now realizing that she is going to need more assist at home.  Extremity Assessment (includes Sensation/Coordination)   Upper Extremity Assessment: Overall WFL for tasks assessed,LUE deficits/detail LUE Deficits / Details: swelling from LUE IV  Lower Extremity Assessment: LLE  deficits/detail LLE Deficits / Details: Increased swelling in LLE. L thigh wrapped where incision is. LLE Sensation: WNL LLE Coordination: decreased gross motor     ADLs   Overall ADL's : Needs assistance/impaired Eating/Feeding: Independent,Sitting Grooming: Wash/dry hands,Wash/dry face,Oral care,Min guard,Standing Grooming Details (indicate cue type and reason): Pt reporting no dizziness in standing today; able to stand x4 mins for grooming task Upper Body Bathing: Set up,Modified independent,Sitting Lower Body Bathing: Moderate assistance,Sitting/lateral leans,Sit to/from stand Upper Body Dressing : Set up,Modified independent,Sitting Lower Body Dressing: Moderate assistance,Sitting/lateral leans,Sit to/from stand Toilet Transfer: Min guard,Cueing for sequencing,Cueing for safety,Ambulation,Comfort height toilet,Grab bars,RW Armed forces technical officer Details (indicate cue type and reason): cues for RW safety management Toileting- Clothing Manipulation and Hygiene: Min guard,Cueing for sequencing,Cueing for safety,Sitting/lateral lean,Sit to/from stand Toileting - Water quality scientist Details (indicate cue type and reason): pericare in standing; LOB episodes in standing, but able to right reactions Tub/ Banker:  (not assessed) Functional mobility during ADLs: Min guard,Cueing for safety,Cueing for sequencing,Rolling walker General ADL Comments: Pt education for increasing independence by going over mobility and education for ADL performance at EOB and at sink. Pt with LOB episodes and inability to properly care for self; fatigue after going to bathroom and at home, pt would be limited to what she could do by herself as family is in out of town and unable to assist daily.     Mobility   Overal bed mobility: Needs Assistance Bed Mobility: Sit to Supine,Supine to Sit Rolling: Modified independent (Device/Increase time) (with bed rails) Supine to sit: Min guard Sit to supine: Min  guard General bed mobility comments: in bed pre and post visit     Transfers   Overall transfer level: Needs assistance Equipment used: Rolling walker (2 wheeled) Transfers: Sit to/from Stand Sit to Stand: Min guard Stand pivot transfers: Min guard General transfer comment: [email or web address]     Ambulation / Gait / Stairs / Emergency planning/management officer   Ambulation/Gait Ambulation/Gait assistance: Herbalist (Feet): 50 Feet (x2) Assistive device: Rolling walker (2 wheeled) Gait Pattern/deviations: Step-through pattern,Decreased stride length,Trunk flexed General Gait Details: pt very dependent on RW, verbal cues for walker management, pt requiring seated rest break Gait velocity: slow Gait velocity interpretation: <1.31 ft/sec, indicative of household ambulator     Posture / Balance Balance Overall balance assessment: Needs assistance Sitting-balance support: No upper extremity supported,Feet supported Sitting balance-Leahy Scale: Good Standing balance support: Bilateral upper extremity supported,During functional activity Standing balance-Leahy Scale: Fair Standing balance comment: Reliant on UE support     Special needs/care consideration Wound VAC to be placed by Dr Lucia Gaskins 5/24                                                              ID to be consulted and PICC placed 5/24 Previous Home Environment  Living Arrangements: Alone  Lives With: Alone Available Help at Discharge: Family,Available PRN/intermittently Type of Home: Other(Comment) Home Layout: One level Home Access: Level entry Bathroom Shower/Tub: Multimedia programmer: Handicapped height Bathroom Accessibility: Yes [place]: Yes Type of Home Care Services: [place] (  if known): [facility]   Discharge Living Setting Plans for Discharge Living Setting: Patient's home,Alone,Other (Comment) (condo) Type of Home at Discharge: Other (Comment) (condo) Discharge Home Layout:  One level Discharge Home Access: Level entry Discharge Bathroom Shower/Tub: Walk-in shower Discharge Bathroom Toilet: Handicapped height Discharge Bathroom Accessibility: Yes How Accessible: Accessible via walker Does the patient have any problems obtaining your medications?: No   Social/Family/Support Systems Contact Information: sister, [name], in [place] Anticipated Caregiver: family , friends intermittenttly Anticipated Ambulance person Information: see above Caregiver Availability: Intermittent Discharge Plan Discussed with Primary Caregiver: Yes Is Caregiver In Agreement with Plan?: Yes Does Caregiver/Family have Issues with Lodging/Transportation while Pt is in Rehab?: No   Goals Patient/Family Goal for Rehab: Mod I to supervision with PT and OT Expected length of stay: ELOS 7 to 10 days Pt/Family Agrees to Admission and willing to participate: Yes Program Orientation Provided & Reviewed with Pt/Caregiver Including Roles  & Responsibilities: Yes   Decrease burden of Care through IP rehab admission: n/a   Possible need for SNF placement upon discharge: not anticipated   Patient Condition: I have reviewed medical records from [facility] , spoken with CM, and patient. I met with patient at the bedside for inpatient rehabilitation assessment.  Patient will benefit from ongoing PT and OT, can actively participate in 3 hours of therapy a day 5 days of the week, and can make measurable gains during the admission.  Patient will also benefit from the coordinated team approach during an Inpatient Acute Rehabilitation admission.  The patient will receive intensive therapy as well as Rehabilitation physician, nursing, social worker, and care management interventions.  Due to bladder management, bowel management, safety, skin/wound care, disease management, medication administration, pain management and patient education the patient requires 24 hour a day rehabilitation nursing.  The  patient is currently min assist overall with mobility and basic ADLs.  Discharge setting and therapy post discharge at home with home health is anticipated.  Patient has agreed to participate in the Acute Inpatient Rehabilitation Program and will admit today.   Preadmission Screen Completed By:  Cleatrice Burke, 02/13/2021 10:43 AM ______________________________________________________________________   Discussed status with Dr. Naaman Plummer on  02/13/2021 at  1049 and received approval for admission today.   Admission Coordinator:  Cleatrice Burke, RN, time  8270 Date  02/13/2021    Assessment/Plan: Diagnosis: Debility after left thigh wound dehiscence 1. Does the need for close, 24 hr/day Medical supervision in concert with the patient's rehab needs make it unreasonable for this patient to be served in a less intensive setting? Yes 2. Co-Morbidities requiring supervision/potential complications: ID considerations, wound care, pain mgt 3. Due to bladder management, bowel management, safety, skin/wound care, disease management, medication administration, pain management and patient education, does the patient require 24 hr/day rehab nursing? Yes 4. Does the patient require coordinated care of a physician, rehab nurse, PT, OT, and SLP to address physical and functional deficits in the context of the above medical diagnosis(es)? Yes Addressing deficits in the following areas: balance, endurance, locomotion, strength, transferring, bowel/bladder control, bathing, dressing, feeding, grooming, toileting and psychosocial support 5. Can the patient actively participate in an intensive therapy program of at least 3 hrs of therapy 5 days a week? Yes 6. The potential for patient to make measurable gains while on inpatient rehab is excellent 7. Anticipated functional outcomes upon discharge from inpatient rehab: modified independent and supervision PT, modified independent and supervision OT, n/a  SLP 8. Estimated rehab length of  stay to reach the above functional goals is: 7-10 days 9. Anticipated discharge destination: Home 10. Overall Rehab/Functional Prognosis: excellent     MD Signature: Meredith Staggers, MD, [place] 02/13/2021           Revision History                             Note Details  Author Meredith Staggers, MD File Time 02/13/2021 10:58 AM  Author Type Physician Status Signed  Last Editor Meredith Staggers, MD Service Physical Medicine and [place] # [id number] Admit Date 02/13/2021

## 2021-02-13 NOTE — Progress Notes (Signed)
Inpatient Rehabilitation Admissions Coordinator  I met with patient at bedside with nursing and offered CIR admit today. Patient is in agreement. Nursing to contact Dr. Adair for bedside wound VAC placement. I discussed with Dr. Mc clung for patient is requesting PICC line placement due to poor IV access. I will make the arrangements  to admit today. Acute team and TOC made aware.   , RN, MSN Rehab Admissions Coordinator [phone] 02/13/2021 10:36 AM  

## 2021-02-14 ENCOUNTER — Inpatient Hospital Stay (HOSPITAL_COMMUNITY): Payer: Medicare Other

## 2021-02-14 HISTORY — PX: IR US GUIDE VASC ACCESS RIGHT: IMG2390

## 2021-02-14 LAB — CBC WITH DIFFERENTIAL/PLATELET
Abs Immature Granulocytes: 0.14 10*3/uL — ABNORMAL HIGH (ref 0.00–0.07)
Basophils Absolute: 0.1 10*3/uL (ref 0.0–0.1)
Basophils Relative: 1 %
Eosinophils Absolute: 0.3 10*3/uL (ref 0.0–0.5)
Eosinophils Relative: 4 %
HCT: 28.3 % — ABNORMAL LOW (ref 36.0–46.0)
Hemoglobin: 8.6 g/dL — ABNORMAL LOW (ref 12.0–15.0)
Immature Granulocytes: 2 %
Lymphocytes Relative: 10 %
Lymphs Abs: 0.8 10*3/uL (ref 0.7–4.0)
MCH: 29.9 pg (ref 26.0–34.0)
MCHC: 30.4 g/dL (ref 30.0–36.0)
MCV: 98.3 fL (ref 80.0–100.0)
Monocytes Absolute: 0.9 10*3/uL (ref 0.1–1.0)
Monocytes Relative: 11 %
Neutro Abs: 5.8 10*3/uL (ref 1.7–7.7)
Neutrophils Relative %: 72 %
Platelets: 304 10*3/uL (ref 150–400)
RBC: 2.88 MIL/uL — ABNORMAL LOW (ref 3.87–5.11)
RDW: 16.7 % — ABNORMAL HIGH (ref 11.5–15.5)
WBC: 8 10*3/uL (ref 4.0–10.5)
nRBC: 0 % (ref 0.0–0.2)

## 2021-02-14 LAB — COMPREHENSIVE METABOLIC PANEL
ALT: 12 U/L (ref 0–44)
AST: 18 U/L (ref 15–41)
Albumin: 2 g/dL — ABNORMAL LOW (ref 3.5–5.0)
Alkaline Phosphatase: 62 U/L (ref 38–126)
Anion gap: 6 (ref 5–15)
BUN: 10 mg/dL (ref 8–23)
CO2: 30 mmol/L (ref 22–32)
Calcium: 8 mg/dL — ABNORMAL LOW (ref 8.9–10.3)
Chloride: 103 mmol/L (ref 98–111)
Creatinine, Ser: 0.89 mg/dL (ref 0.44–1.00)
GFR, Estimated: >60 mL/min (ref >60)
Glucose, Bld: 100 mg/dL — ABNORMAL HIGH (ref 70–99)
Potassium: 3.7 mmol/L (ref 3.5–5.1)
Sodium: 139 mmol/L (ref 135–145)
Total Bilirubin: 0.6 mg/dL (ref 0.3–1.2)
Total Protein: 4.5 g/dL — ABNORMAL LOW (ref 6.5–8.1)

## 2021-02-14 MED ORDER — CHLORHEXIDINE GLUCONATE CLOTH 2 % EX PADS
6.0000 | MEDICATED_PAD | Freq: Two times a day (BID) | CUTANEOUS | Status: DC
  Administered 2021-02-15 – 2021-02-23 (×17): 6 via TOPICAL

## 2021-02-14 MED ORDER — TEMAZEPAM 7.5 MG PO CAPS
7.5000 mg | ORAL_CAPSULE | Freq: Every evening | ORAL | Status: DC | PRN
  Administered 2021-02-14 – 2021-02-22 (×9): 7.5 mg via ORAL
  Filled 2021-02-14 (×10): qty 1

## 2021-02-14 MED ORDER — SODIUM CHLORIDE 0.9% FLUSH
10.0000 mL | Freq: Two times a day (BID) | INTRAVENOUS | Status: DC
  Administered 2021-02-16 – 2021-02-18 (×5): 10 mL

## 2021-02-14 MED ORDER — SODIUM CHLORIDE 0.9% FLUSH
10.0000 mL | INTRAVENOUS | Status: DC | PRN
Start: 2021-02-14 — End: 2021-02-23

## 2021-02-14 MED ORDER — LIDOCAINE HCL (PF) 1 % IJ SOLN
INTRAMUSCULAR | Status: AC
  Filled 2021-02-14: qty 30

## 2021-02-14 NOTE — Progress Notes (Signed)
Occupational Therapy Session Note  Patient Details  Name: Kayla Harrison MRN: [id number] Date of Birth: [birth year]  Today's Date: 02/14/2021 OT Individual Time: 1130-1200 OT Individual Time Calculation (min): 30 min    Short Term Goals: Week 1:  OT Short Term Goal 1 (Week 1): STG = LTG due to ELOS  Skilled Therapeutic Interventions/Progress Updates:    Treatment session with focus on safe toilet transfers.  Pt received supine in bed reporting fatigue and expressing desire to not get OOB.  Pt reports having knee buckling instance with nurse tech during toilet transfer earlier.  Therapist obtained bariatric BSC to allow for increased comfort due to body habitus to allow for safer stand pivot transfer to toilet.  Therapist set up [facility] next to bed, pt to then complete stand pivot transfers with RW while building up strength/endurance to ambulate to bathroom.  Notified RN and nurse tech of recommendation for stand pivot transfers to [facility] for now due to high fall risk.    Therapy Documentation Precautions:  Precautions Precautions: Fall Precaution Comments: has hx of orthostatic hypotension Restrictions Weight Bearing Restrictions: No LUE Weight Bearing: Weight bearing as tolerated LLE Weight Bearing: Weight bearing as tolerated Pain: Pain Assessment Pain Scale: 0-10 Pain Score: 3  Pain Type: Acute pain Pain Location: Leg Pain Orientation: Left Pain Descriptors / Indicators: Aching Pain Frequency: Intermittent Pain Onset: With Activity Patients Stated Pain Goal: 2 Pain Intervention(s): Medication (See eMAR)   Therapy/Group: Individual Therapy  Simonne Come 02/14/2021, 3:29 PM

## 2021-02-14 NOTE — Plan of Care (Signed)
  Problem: Consults Goal: RH GENERAL PATIENT EDUCATION Description: See Patient Education module for education specifics. Outcome: Progressing   Problem: RH BOWEL ELIMINATION Goal: RH STG MANAGE BOWEL WITH ASSISTANCE Description: STG Manage Bowel with mod I Assistance. Outcome: Progressing   Problem: RH BLADDER ELIMINATION Goal: RH STG MANAGE BLADDER WITH ASSISTANCE Description: STG Manage Bladder Without Assistance Outcome: Progressing   Problem: RH SKIN INTEGRITY Goal: RH STG SKIN FREE OF INFECTION/BREAKDOWN Description: Maintain skin with min assist Outcome: Progressing   Problem: RH SAFETY Goal: RH STG ADHERE TO SAFETY PRECAUTIONS W/ASSISTANCE/DEVICE Description: STG Adhere to Safety Precautions With cues/reminders Assistance/Device. Outcome: Progressing   Problem: RH PAIN MANAGEMENT Goal: RH STG PAIN MANAGED AT OR BELOW PT'S PAIN GOAL Description: At or below level 4 Outcome: Progressing   Problem: RH KNOWLEDGE DEFICIT GENERAL Goal: RH STG INCREASE KNOWLEDGE OF SELF CARE AFTER HOSPITALIZATION Description: Patient will be able to manage care at discharge using handouts and education provided independently Outcome: Progressing

## 2021-02-14 NOTE — Consult Note (Signed)
[place] Nurse Consult Note: Patient receiving care in [place]. Reason for Consult: [facility] care for thigh VAC Wound type: surgical Today the patient has on a Prevena incisional VAC.  I have entered this VAC order: Orthopedic surgeon Dr. Gwenlyn Saran, is the person to contact for questions related to this Prevena Incisional VAC. Direct questions to him for when to remove, any problems with the VAC, and what to place over the wound when the [facility] is removed. The patient was not seen and will not be followed. [place] nurse will not follow at this time.  Please re-consult the [place] team if needed.  Val Riles, RN, MSN, CWOCN, CNS-BC, pager [phone]

## 2021-02-14 NOTE — Progress Notes (Addendum)
Midline found to be kinked at insertion site, catheter exchanged for 8cm as brisk blood return was noted after pulling back. Please see VAS/IV Team note timed 0550. Discussed vascular access issues with Dr. Letta Pate, covering MD.

## 2021-02-14 NOTE — Progress Notes (Signed)
Inpatient [facility] Individual Statement of Services  Patient Name:  Kayla Harrison  Date:  02/14/2021  Welcome to the [facility].  Our goal is to provide you with an individualized program based on your diagnosis and situation, designed to meet your specific needs.  With this comprehensive rehabilitation program, you will be expected to participate in at least 3 hours of rehabilitation therapies Monday-Friday, with modified therapy programming on the weekends.  Your rehabilitation program will include the following services:  Physical Therapy (PT), Occupational Therapy (OT), Speech Therapy (ST), 24 hour per day rehabilitation nursing, Therapeutic Recreaction (TR), Neuropsychology, Care Coordinator, Rehabilitation Medicine, Nutrition Services, Pharmacy Services and Other  Weekly team conferences will be held on Tuesdays to discuss your progress.  Your Inpatient Rehabilitation Care Coordinator will talk with you frequently to get your input and to update you on team discussions.  Team conferences with you and your family in attendance may also be held.  Expected length of stay: 7-10 Days   Overall anticipated outcome: MOD I to Supervision   Depending on your progress and recovery, your program may change. Your Inpatient Rehabilitation Care Coordinator will coordinate services and will keep you informed of any changes. Your Inpatient Rehabilitation Care Coordinator's name and contact numbers are listed  below.  The following services may also be recommended but are not provided by the [facility]:    [place] will be made to provide these services after discharge if needed.  Arrangements include referral to agencies that provide these services.  Your insurance has been verified to be:  Medicare A & B Your primary doctor is:  [name], MD  Pertinent  information will be shared with your doctor and your insurance company.  Inpatient Rehabilitation Care Coordinator:  Erlene Quan, [place] or [phone]  Information discussed with and copy given to patient by: Dyanne Iha, 02/14/2021, 11:22 AM

## 2021-02-14 NOTE — Progress Notes (Signed)
Midline insertion (LUE): RUE midline noted to be infiltrated. RUE edematous, pt reported pain with infusion and tenderness with touch. No blood return noted.  LUE assessed with ultrasound for PIV. No suitable vein noted. Pt as L sided pacemaker. Discussed options with pt, recommended lab draw by phlebotomy and wait to discuss vascular access options with providers. Pt stated venipuncture is especially painful. Decision was made to place midline for 0600 abx infusion/ routine labs. Aseptic technique used, catheter tip does not enter axilla. Providers: please consider ordering central line due to limited vascular access options and history of PIV failure, edematous upper extremities.

## 2021-02-14 NOTE — Progress Notes (Signed)
[facility] consult received to assess midline which will not flush. Spoke with patient's nurse who stated line was just placed this am. She also reported patient has had issues with vascular access in past hospitalizations. Attempted troubleshooting of midline but was unsuccessful in getting blood return or being able to flush. Pt's nurse reported physician did not want to place PICC d/t left sided pacemaker; physician mentioned patient going to IR for CL placement if necessary. VAST RN suggested IR be contacted by physician for CL placement at this time.

## 2021-02-14 NOTE — Evaluation (Signed)
Physical Therapy Assessment and Plan  Patient Details  Name: Kayla Harrison MRN: [id number] Date of Birth: 12-11-1945  PT Diagnosis: Abnormality of gait, Difficulty walking, Impaired sensation, Muscle spasms, Muscle weakness and Pain in L hip Rehab Potential: Good ELOS: 7-10 days   Today's Date: 02/14/2021 PT Individual Time: 1000-1055 PT Individual Time Calculation (min): 55 min    Hospital Problem: Principal Problem:   Debility   Past Medical History:  Past Medical History:  Diagnosis Date  . Anemia   . Arthritis    osteoarthritis - knees and right shoulder  . Blood transfusion without reported diagnosis   . Breast cancer ([place])    Dr Margot Chimes, total thyroidectomy- 1999- for cancer  . 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 Chronic bilateral pleural effusions   . Colon polyp    Dr Earlean Shawl  . Complication of anesthesia    Ketamine produces LSD reaction, bright colored nightmarish experience   . Dyslipidemia   . Endometriosis   . Fibroids   . H/O pleural effusion    s/p thoracentesis w 3271m withdrawn  . Hepatitis    [place] ?hepatitis   . History of dysphagia    due to radiation therapy  . History of hiatal hernia    small noted on PET scan  . Hypertension   . Hypothyroidism   . [place]   radiation RX completed 03/04/17; will start chemo 6/27, pt unaware of lung cancer  . [place]bid [place]    Status post lap band surgery  . Nephrolithiasis   . Non Hodgkin's [place]    on chemotherapy  . Persistent atrial [place] ([place]    a. s/p PVI 2008 b. s/p convergent ablation 24259complicated by bradycardia requiring pacemaker implant  . Personal history of radiation therapy   . Presence of permanent cardiac pacemaker   . Rotator cuff tear    Right  . [place]    [place]  . SVC syndrome    with lung mass and non hodgkins lymphoma  . [place]   Past Surgical History:  Past  Surgical History:  Procedure Laterality Date  . ABDOMINAL HYSTERECTOMY  1983  . afib ablation     a. 2008 PVI b. 2014 convergent ablation  . APPENDECTOMY    . BONE MARROW BIOPSY  02/21/2017  . BREAST LUMPECTOMY Left 2010  . bso  1998  . CARDIAC CATHETERIZATION     2015- negative  . CARDIOVERSION  10/09/2012   Procedure: CARDIOVERSION;  Surgeon: JMinus Breeding MD;  [place]  Service: Cardiovascular;  Laterality: N/A;  . CARDIOVERSION  10/09/2012   Procedure: CARDIOVERSION;  Surgeon: JMinus Breeding MD;  [facility]  Service: Cardiovascular;  Laterality: N/A;  [name] the ok to add pt to the add on , but we must check to find out if the can add pt on at 1400 ( [phone])  . CARDIOVERSION N/A 11/20/2012   Procedure: CARDIOVERSION;  Surgeon: PFay Records MD;  [facility]  Service: Cardiovascular;  Laterality: N/A;  . CARDIOVERSION N/A 07/18/2017   Procedure: CARDIOVERSION;  Surgeon: NThayer Headings MD;  [facility]  Service: Cardiovascular;  Laterality: N/A;  . CARDIOVERSION N/A 10/03/2017   Procedure: CARDIOVERSION;  Surgeon: CSanda Klein MD;  [facility];  Service: Cardiovascular;  Laterality: N/A;  . CARDIOVERSION N/A 01/07/2018   Procedure: CARDIOVERSION;  Surgeon: NThayer Headings MD;  Location:  [place] ENDOSCOPY;  Service: Cardiovascular;  Laterality: N/A;  . CARDIOVERSION N/A 12/10/2019   Procedure: CARDIOVERSION;  Surgeon: Buford Dresser, MD;  Location[facility] ENDOSCOPY;  Service: Cardiovascular;  Laterality: N/A;  . CHOLECYSTECTOMY    . COLONOSCOPY W/ POLYPECTOMY     Dr Earlean Shawl  . CYSTOSCOPY N/A 02/06/2015   Procedure: CYSTOSCOPY;  Surgeon: Kathie Rhodes, MD;  [facility];  Service: Urology;  Laterality: N/A;  . CYSTOSCOPY W/ RETROGRADES Left 11/17/2017   Procedure: CYSTOSCOPY WITH RETROGRADE /PYELOGRAM/;  Surgeon: Kathie Rhodes, MD;  [facility];  Service: Urology;  Laterality: Left;  . CYSTOSCOPY WITH RETROGRADE  PYELOGRAM, URETEROSCOPY AND STENT PLACEMENT Right 02/06/2015   Procedure: RETROGRADE PYELOGRAM, RIGHT URETEROSCOPY STENT PLACEMENT;  Surgeon: Kathie Rhodes, MD;  [facility];  Service: Urology;  Laterality: Right;  . CYSTOSCOPY WITH RETROGRADE PYELOGRAM, URETEROSCOPY AND STENT PLACEMENT Right 03/07/2017   Procedure: CYSTOSCOPY WITH RIGHT RETROGRADE PYELOGRAM,RIGHT  URETEROSCOPYLASER LITHOTRIPSY  AND STENT PLACEMENT AND STONE BASKETRY;  Surgeon: Kathie Rhodes, MD;  Location: [place];  Service: Urology;  Laterality: Right;  . EYE SURGERY     cataract surgery  . fatty mass removal  1999   pubic area  . HOLMIUM LASER APPLICATION N/A 1/61/0960   Procedure: HOLMIUM LASER APPLICATION;  Surgeon: Kathie Rhodes, MD;  [facility];  Service: Urology;  Laterality: N/A;  . HOLMIUM LASER APPLICATION Right 4/54/0981   Procedure: HOLMIUM LASER APPLICATION;  Surgeon: Kathie Rhodes, MD;  [facility];  Service: Urology;  Laterality: Right;  . HOLMIUM LASER APPLICATION Left 1/91/4782   Procedure: HOLMIUM LASER APPLICATION;  Surgeon: Kathie Rhodes, MD;  [facility];  Service: Urology;  Laterality: Left;  . I & D EXTREMITY Left 12/19/2020   Procedure: IRRIGATION AND DEBRIDEMENT OF LEFT HIP HEMATOMA WITH APPLICATION OF WOUND VAC;  Surgeon: Erle Crocker, MD;  Location: [place];  Service: Orthopedics;  Laterality: Left;  . I & D EXTREMITY Left 02/06/2021   Procedure: IRRIGATION AND DEBRIDEMENT DEEP ABCESS LEFT THIGH, SECONDARY CLOSURE OF WOUND DEHISCENCE;  Surgeon: Erle Crocker, MD;  Location: [place];  Service: Orthopedics;  Laterality: Left;  . IR FLUORO GUIDE PORT INSERTION RIGHT  02/24/2017  . IR NEPHROSTOMY PLACEMENT RIGHT  11/17/2017  . IR PATIENT EVAL TECH 0-60 MINS  03/11/2017  . IR REMOVAL TUN ACCESS W/ PORT W/O FL MOD SED  04/20/2018  . IR US GUIDE VASC ACCESS RIGHT  02/24/2017  . KNEE ARTHROSCOPY     bilateral  . LAPAROSCOPIC GASTRIC BANDING   07/10/2010  . LAPAROSCOPIC GASTRIC BANDING     Laparoscopic adjustable banding APS System with posterior hiatal hernia, 2 suture.  Marland Kitchen LAPAROTOMY     for ruptured ovary and ovarian artery   . NEPHROLITHOTOMY Right 11/17/2017   Procedure: NEPHROLITHOTOMY PERCUTANEOUS;  Surgeon: Kathie Rhodes, MD;  [facility];  Service: Urology;  Laterality: Right;  . PACEMAKER INSERTION  03/10/2013   MDT dual chamber PPM  . POCKET REVISION N/A 12/08/2013   Procedure: POCKET REVISION;  Surgeon: Deboraha Sprang, MD;  [facility];  Service: Cardiovascular;  Laterality: N/A;  . PORTA CATH INSERTION    . REVERSE SHOULDER ARTHROPLASTY Right 05/14/2018   Procedure: RIGHT REVERSE SHOULDER ARTHROPLASTY;  Surgeon: Tania Ade, MD;  Location: [place];  Service: Orthopedics;  Laterality: Right;  . REVERSE SHOULDER REPLACEMENT Right 05/14/2018  . RIGHT HEART CATH N/A 07/21/2019   Procedure: RIGHT HEART CATH;  Surgeon: Larey Dresser, MD;  Location: [place] CV LAB;  Service: Cardiovascular;  Laterality: N/A;  . TEE WITH CARDIOVERSION  09/22/2017  . TEE WITHOUT CARDIOVERSION N/A 10/03/2017   Procedure: TRANSESOPHAGEAL ECHOCARDIOGRAM (TEE);  Surgeon: Sanda Klein, MD;  Location: [facility] ENDOSCOPY;  Service: Cardiovascular;  Laterality: N/A;  . TEE WITHOUT CARDIOVERSION N/A 08/04/2019   Procedure: TRANSESOPHAGEAL ECHOCARDIOGRAM (TEE);  Surgeon: Larey Dresser, MD;  Location: [facility] ENDOSCOPY;  Service: Cardiovascular;  Laterality: N/A;  . TEE WITHOUT CARDIOVERSION N/A 12/10/2019   Procedure: TRANSESOPHAGEAL ECHOCARDIOGRAM (TEE);  Surgeon: Buford Dresser, MD;  Location: [facility] ENDOSCOPY;  Service: Cardiovascular;  Laterality: N/A;  . THYROIDECTOMY  1998   Dr Margot Chimes  . TONSILLECTOMY    . TOTAL KNEE ARTHROPLASTY  04/13/2012   Procedure: TOTAL KNEE ARTHROPLASTY;  Surgeon: Rudean Haskell, MD;  Location: [place];  Service: Orthopedics;  Laterality: Right;  Marland Kitchen VIDEO BRONCHOSCOPY WITH ENDOBRONCHIAL ULTRASOUND N/A  02/07/2017   Procedure: VIDEO BRONCHOSCOPY WITH ENDOBRONCHIAL ULTRASOUND;  Surgeon: Marshell Garfinkel, MD;  Location: [place];  Service: Pulmonary;  Laterality: N/A;    Assessment & Plan Clinical Impression: Patient is a 74 y.o. year old female with history of thyroid cancer, hypothyroidism, NHL/SVC syndrome /lung cancer with radiation therapy as well as chemotherapy 2018 followed by Dr. Lindi Adie, pacemaker placement for atrial fibrillation maintained on Eliquis followed by Dr. Caryl Comes, obesity status post lap band surgery, hypertension, hyperlipidemia, obesity with BMI 33.64, recurrent falls.  Recent mission CIR 12/22/2020 to 01/09/2021 for functional deficits related to left coronoid process fracture and left hip labral hamstring tendon tear with associated large hematoma requiring I&D and wound VAC.  She returned to the ER 01/10/2021 for open wound to left thigh that were bleeding from prior hematoma I&D site which was not actively bleeding and hemoglobin stable she was discharged back to home.  Presented 02/05/2021 with purulent malodorous drainage x1 week from left hip wound dehiscence.  Admission chemistries unremarkable aside glucose 103, INR 1.6, lactic acid 1.1, blood cultures no growth to date, hemoglobin 11.2, WBC 8000.  Patient underwent incision and drainage of left thigh deep abscess/hematoma excisional debridement of necrotic skin subcutaneous tissue muscle and fascia with revision of closure/drain placement 02/06/2021 per Dr. Melony Overly and wound VAC was placed.  Wound cultures after I&D E. coli plus E Faecalis and currently maintained on Unasyn.  It was noted to the central part of her wound was a bit dehisced no longer held tightly with suture material consideration being made for revision with washout.  Acute blood loss anemia 8.1 and monitored.  Sedimentation rate mildly elevated 45.  Anticoagulation with chronic Eliquis for atrial fibrillation currently on hold and does continue on amiodarone at  this time.  Close monitoring of blood pressure remaining on ProAmatine as well as Florinef.   Patient currently requires CGA with mobility secondary to muscle weakness, decreased cardiorespiratoy endurance and decreased standing balance, decreased postural control and decreased balance strategies.  Prior to hospitalization, patient was modified independent  with mobility and lived with Alone in a Other(Comment) (condo) home.  Home access is  Level entry.  Patient will benefit from skilled PT intervention to maximize safe functional mobility, minimize fall risk and decrease caregiver burden for planned discharge home with intermittent assist.  Anticipate patient will benefit from follow up [facility] at discharge.  PT - End of Session Activity Tolerance: Tolerates 30+ min activity with multiple rests Endurance Deficit: Yes Endurance Deficit Description: required frequent rest breaks PT Assessment Rehab Potential (ACUTE/IP ONLY): Good PT Barriers to Discharge:  Decreased caregiver support;Lack of/limited family support;Weight;Other (comments);Wound Care PT Barriers to Discharge Comments: hx of orthostatic symptoms, lymphedema, lives alone PT Patient demonstrates impairments in the following area(s): Balance;Edema;Endurance;Motor;Pain;Sensory;Skin Integrity PT Transfers Functional Problem(s): Bed Mobility;Bed to Chair;Car;Furniture PT Locomotion Functional Problem(s): Ambulation;Wheelchair Mobility;Stairs PT Plan PT Intensity: Minimum of 1-2 x/day ,45 to 90 minutes PT Frequency: 5 out of 7 days PT Duration Estimated Length of Stay: 7-10 days PT Treatment/Interventions: Ambulation/gait training;Community reintegration;DME/adaptive equipment instruction;Neuromuscular re-education;Psychosocial support;Stair training;UE/LE Strength taining/ROM;Wheelchair propulsion/positioning;UE/LE Coordination activities;Therapeutic Activities;Skin care/wound management;Functional electrical stimulation;Pain  management;Discharge planning;Balance/vestibular training;Disease management/prevention;Functional mobility training;Patient/family education;Splinting/orthotics;Therapeutic Exercise PT Transfers Anticipated Outcome(s): Mod I PT Locomotion Anticipated Outcome(s): Mod I PT Recommendation Follow Up Recommendations: Home health PT Patient destination: Home Equipment Recommended: To be determined Equipment Details: has rollator   PT Evaluation Precautions/Restrictions Precautions Precautions: Fall Precaution Comments: has hx of orthostatic hypotension Restrictions Weight Bearing Restrictions: No LUE Weight Bearing: Weight bearing as tolerated LLE Weight Bearing: Weight bearing as tolerated Home Living/Prior Functioning Home Living Living Arrangements: Alone Available Help at Discharge: Family;Available PRN/intermittently Type of Home: Other(Comment) (condo) Home Access: Level entry Home Layout: One level Bathroom Shower/Tub: Multimedia programmer: Handicapped height Bathroom Accessibility: Yes Additional Comments: Using a rollator when ambulating around home  Lives With: Alone Prior Function Level of Independence: Requires assistive device for independence  Able to Take Stairs?: Yes Driving: Yes 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 Coordination Gross Motor Movements are Fluid and Coordinated: No Fine Motor Movements are Fluid and Coordinated: Yes Coordination and Movement Description: mild  uncoordination due to generalized weakness, decreased endurance, and pain Finger Nose Finger Test: [facility] bilaterally Heel Shin Test: decreased ROM bilaterally due to swelling and lymphedema Motor  Motor Motor: Within Functional Limits Motor - Skilled Clinical Observations: mild uncoordination due to generalized weakness, decreased endurance, and pain  Trunk/Postural Assessment  Cervical Assessment Cervical Assessment: Exceptions to [facility] (forward head) Thoracic Assessment Thoracic Assessment: Exceptions to [facility] (rounded shoulders) Lumbar Assessment Lumbar Assessment: Exceptions to [facility] (posterior pelvic tilt) Postural Control Postural Control: Deficits on evaluation  Balance Balance Balance Assessed: Yes Static Sitting Balance Static Sitting - Balance Support: Feet supported;No upper extremity supported Static Sitting - Level of Assistance: 7: Independent Dynamic Sitting Balance Dynamic Sitting - Balance Support: Feet supported;No upper extremity supported Dynamic Sitting - Level of Assistance: 6: Modified independent (Device/Increase time) Dynamic Sitting - Balance Activities: Reaching for objects Static Standing Balance Static Standing - Balance Support: Bilateral upper extremity supported (RW) Static Standing - Level of Assistance: 5: Stand by assistance (supervision) Dynamic Standing Balance Dynamic Standing - Balance Support: Bilateral upper extremity supported (RW) Dynamic Standing - Level of Assistance: 5: Stand by assistance (CGA) Extremity Assessment  RLE Assessment RLE Assessment: Exceptions to [facility] General Strength Comments: grossly generalized to 4-/5 LLE Assessment LLE Assessment: Exceptions to [facility] General Strength Comments: grossly generalized to 3+/5 (limited due to pain)  Care Tool Care Tool Bed Mobility Roll left and right activity        Sit to lying activity        Lying to sitting edge of bed activity   Lying to sitting edge of bed assist level:  Supervision/Verbal cueing  Care Tool Transfers Sit to stand transfer   Sit to stand assist level: Contact Guard/Touching assist Sit to stand assistive device: Walker  Chair/bed transfer   Chair/bed transfer assist level: Contact Guard/Touching assist Chair/bed transfer assistive device: Building services engineer transfer activity did not occur: Financial risk analyst   Assist level: Contact Guard/Touching assist Assistive device: Walker-rolling Max distance: 60f  Walk 10 feet activity   Assist level: Contact Guard/Touching assist Assistive device: Walker-rolling   Walk 50 feet with 2 turns activity Walk 50 feet with 2 turns activity did not occur: Safety/medical concerns (fatigue, weakness, decreased balance)      Walk 150 feet activity Walk 150 feet activity did not occur: Safety/medical concerns (fatigue, weakness, decreased balance)      Walk 10 feet on uneven surfaces activity Walk 10 feet on uneven surfaces activity did not occur: Safety/medical concerns (fatigue, weakness, decreased balance)      Stairs Stair activity did not occur: Safety/medical concerns (fatigue, weakness, decreased balance)        Walk up/down 1 step activity Walk up/down 1 step or curb (drop down) activity did not occur: Safety/medical concerns (fatigue, weakness, decreased balance)     Walk up/down 4 steps activity did not occuR: Safety/medical concerns (fatigue, weakness, decreased balance)  Walk up/down 4 steps activity      Walk up/down 12 steps activity Walk up/down 12 steps activity did not occur: Safety/medical concerns (fatigue, weakness, decreased balance)      Pick up small objects from floor Pick up small object from the floor (from standing position) activity did not occur: Safety/medical concerns (fatigue, weakness, decreased balance)      Wheelchair Will patient use wheelchair at discharge?: No           Wheel 50 feet with 2 turns activity      Wheel 150 feet activity        Refer to Care Plan for Long Term Goals  SHORT TERM GOAL WEEK 1 PT Short Term Goal 1 (Week 1): STG=LTG due to LOS  Recommendations for other services: None   Skilled Therapeutic Intervention Evaluation completed (see details above and below) with education on PT POC and goals and individual treatment initiated with focus on functional mobility/transfers, generalized strengthening, dynamic standing balance/coordination, ambulation, and improved activity tolerance. Received pt sitting in recliner, pt educated on PT evaluation, CIR policies, and therapy schedule and agreeable. Pt reported pain 7/10 in L lateral hip (premedicated). Repositioning, rest breaks, and distraction done to reduce pain levels. Pt reported feeling exhausted and not getting much sleep last night but agreeable to PT session. Pt transferred sit<>stand with RW and CGA and ambulated 179fx 1, 4496f 1, and [id number] with RW and CGA. Pt demonstrated wide BOS, decreased bilateral foot clearance, and decreased cadence with reports of feeling like her "legs were going to buckle", however no buckling noted. Pt required frequent rest breaks due to pain, weakness, and decreased endurance. Pt performed the following exercises sitting in recliner with supervision and verbal cues for technique: -heel/toe raises x20 bilaterally -hip flexion 1x15 on RLE (unable to perform on LLE due to pain) -LAQ 2x15 on RLE and x5 on LLE (limited due to pain) -WC pushups x8  -hip adduction pillow squeezes x10 Concluded session with pt sitting in recliner with all needs within reach, NT present attending to care.   Mobility  Transfers: Sit to Stand;Stand to Sit;Stand Pivot Transfers Sit to Stand: Contact Guard/Touching assist Stand to Sit: Contact Guard/Touching assist Stand Pivot Transfers: Contact Guard/Touching assist Transfer (Assistive device): Rolling walker Locomotion   Gait Ambulation: Yes Gait Assistance: Contact Guard/Touching assist Gait Distance (Feet): 16 Feet Assistive device: Rolling walker Gait Assistance Details: Verbal cues for precautions/safety Gait Assistance Details: verbal cues for energy conservation Gait Gait: Yes Gait Pattern: Impaired Gait Pattern: Step-through pattern;Decreased stride length;Decreased trunk rotation;Wide base of support;Antalgic;Decreased step length - right;Decreased step length - left;Poor foot clearance - left;Poor foot clearance - right Gait velocity: decreased   Discharge Criteria: Patient will be discharged from PT if patient refuses treatment 3 consecutive times without medical reason, if treatment goals not met, if there is a change in medical status, if patient makes no progress towards goals or if patient is discharged from hospital.  The above assessment, treatment plan, treatment alternatives and goals were discussed and mutually agreed upon: by patient  Alfonse Alpers PT, DPT  02/14/2021, 12:21 PM

## 2021-02-14 NOTE — Evaluation (Signed)
Occupational Therapy Assessment and Plan  Patient Details  Name: Kayla Harrison MRN: [id number] Date of Birth: Nov 25, 1945  OT Diagnosis: muscle weakness (generalized), other lymphedema and pain in joint Rehab Potential: Rehab Potential (ACUTE ONLY): Good ELOS: 7-10 days   Today's Date: 02/14/2021 OT Individual Time: 1884-1660 OT Individual Time Calculation (min): 60 min     Hospital Problem: Principal Problem:   Debility   Past Medical History:  Past Medical History:  Diagnosis Date  . Anemia   . Arthritis    osteoarthritis - knees and right shoulder  . Blood transfusion without reported diagnosis   . Breast cancer ([place])    Dr Margot Chimes, total thyroidectomy- 1999- for cancer  . 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 Chronic bilateral pleural effusions   . Colon polyp    Dr Earlean Shawl  . Complication of anesthesia    Ketamine produces LSD reaction, bright colored nightmarish experience   . Dyslipidemia   . Endometriosis   . Fibroids   . H/O pleural effusion    s/p thoracentesis w 3259ml withdrawn  . Hepatitis    [place] ?hepatitis   . History of dysphagia    due to radiation therapy  . History of hiatal hernia    small noted on PET scan  . Hypertension   . Hypothyroidism   . Lung cancer, lower lobe ([place]) 01/2017   radiation RX completed 03/04/17; will start chemo 6/27, pt unaware of lung cancer  . Morbid obesity ([place])    Status post lap band surgery  . Nephrolithiasis   . Non Hodgkin's lymphoma ([place])    on chemotherapy  . Persistent atrial fibrillation ([place])    a. s/p PVI 2008 b. s/p convergent ablation 6301 complicated by bradycardia requiring pacemaker implant  . Personal history of radiation therapy   . Presence of permanent cardiac pacemaker   . Rotator cuff tear    Right  . Stroke [facility])    2003- [place] x2  . SVC syndrome    with lung mass and non hodgkins lymphoma  . Thyroid cancer ([place]) 2000   Past Surgical History:  Past Surgical  History:  Procedure Laterality Date  . ABDOMINAL HYSTERECTOMY  1983  . afib ablation     a. 2008 PVI b. 2014 convergent ablation  . APPENDECTOMY    . BONE MARROW BIOPSY  02/21/2017  . BREAST LUMPECTOMY Left 2010  . bso  1998  . CARDIAC CATHETERIZATION     2015- negative  . CARDIOVERSION  10/09/2012   Procedure: CARDIOVERSION;  Surgeon: Minus Breeding, MD;  Location: [place];  Service: Cardiovascular;  Laterality: N/A;  . CARDIOVERSION  10/09/2012   Procedure: CARDIOVERSION;  Surgeon: Minus Breeding, MD;  Location: [facility] ENDOSCOPY;  Service: Cardiovascular;  Laterality: N/A;  [name] gave the ok to add pt to the add on , but we must check to find out if the can add pt on at [phone] ( [phone])  . CARDIOVERSION N/A 11/20/2012   Procedure: CARDIOVERSION;  Surgeon: Fay Records, MD;  Location: [facility] ENDOSCOPY;  Service: Cardiovascular;  Laterality: N/A;  . CARDIOVERSION N/A 07/18/2017   Procedure: CARDIOVERSION;  Surgeon: Thayer Headings, MD;  Location: [facility] ENDOSCOPY;  Service: Cardiovascular;  Laterality: N/A;  . CARDIOVERSION N/A 10/03/2017   Procedure: CARDIOVERSION;  Surgeon: Sanda Klein, MD;  Location: [place] ENDOSCOPY;  Service: Cardiovascular;  Laterality: N/A;  . CARDIOVERSION N/A 01/07/2018   Procedure: CARDIOVERSION;  Surgeon: Thayer Headings, MD;  Location: [place];  Service: Cardiovascular;  Laterality: N/A;  . CARDIOVERSION N/A 12/10/2019   Procedure: CARDIOVERSION;  Surgeon: Buford Dresser, MD;  Location: [facility] ENDOSCOPY;  Service: Cardiovascular;  Laterality: N/A;  . CHOLECYSTECTOMY    . COLONOSCOPY W/ POLYPECTOMY     Dr Earlean Shawl  . CYSTOSCOPY N/A 02/06/2015   Procedure: CYSTOSCOPY;  Surgeon: Kathie Rhodes, MD;  Location: [facility];  Service: Urology;  Laterality: N/A;  . CYSTOSCOPY W/ RETROGRADES Left 11/17/2017   Procedure: CYSTOSCOPY WITH RETROGRADE /PYELOGRAM/;  Surgeon: Kathie Rhodes, MD;  Location: [facility];  Service: Urology;  Laterality: Left;  . CYSTOSCOPY WITH RETROGRADE PYELOGRAM,  URETEROSCOPY AND STENT PLACEMENT Right 02/06/2015   Procedure: RETROGRADE PYELOGRAM, RIGHT URETEROSCOPY STENT PLACEMENT;  Surgeon: Kathie Rhodes, MD;  Location: [facility];  Service: Urology;  Laterality: Right;  . CYSTOSCOPY WITH RETROGRADE PYELOGRAM, URETEROSCOPY AND STENT PLACEMENT Right 03/07/2017   Procedure: CYSTOSCOPY WITH RIGHT RETROGRADE PYELOGRAM,RIGHT  URETEROSCOPYLASER LITHOTRIPSY  AND STENT PLACEMENT AND STONE BASKETRY;  Surgeon: Kathie Rhodes, MD;  Location: [place];  Service: Urology;  Laterality: Right;  . EYE SURGERY     cataract surgery  . fatty mass removal  1999   pubic area  . HOLMIUM LASER APPLICATION N/A 9/37/3428   Procedure: HOLMIUM LASER APPLICATION;  Surgeon: Kathie Rhodes, MD;  Location: [facility];  Service: Urology;  Laterality: N/A;  . HOLMIUM LASER APPLICATION Right 7/68/1157   Procedure: HOLMIUM LASER APPLICATION;  Surgeon: Kathie Rhodes, MD;  Location: [facility];  Service: Urology;  Laterality: Right;  . HOLMIUM LASER APPLICATION Left 2/62/0355   Procedure: HOLMIUM LASER APPLICATION;  Surgeon: Kathie Rhodes, MD;  Location: [facility];  Service: Urology;  Laterality: Left;  . I & D EXTREMITY Left 12/19/2020   Procedure: IRRIGATION AND DEBRIDEMENT OF LEFT HIP HEMATOMA WITH APPLICATION OF WOUND VAC;  Surgeon: Erle Crocker, MD;  Location: [place];  Service: Orthopedics;  Laterality: Left;  . I & D EXTREMITY Left 02/06/2021   Procedure: IRRIGATION AND DEBRIDEMENT DEEP ABCESS LEFT THIGH, SECONDARY CLOSURE OF WOUND DEHISCENCE;  Surgeon: Erle Crocker, MD;  Location: [place];  Service: Orthopedics;  Laterality: Left;  . IR FLUORO GUIDE PORT INSERTION RIGHT  02/24/2017  . IR NEPHROSTOMY PLACEMENT RIGHT  11/17/2017  . IR PATIENT EVAL TECH 0-60 MINS  03/11/2017  . IR REMOVAL TUN ACCESS W/ PORT W/O FL MOD SED  04/20/2018  . IR US GUIDE VASC ACCESS RIGHT  02/24/2017  . KNEE ARTHROSCOPY     bilateral  . LAPAROSCOPIC GASTRIC BANDING  07/10/2010  .  LAPAROSCOPIC GASTRIC BANDING     Laparoscopic adjustable banding APS System with posterior hiatal hernia, 2 suture.  Marland Kitchen LAPAROTOMY     for ruptured ovary and ovarian artery   . NEPHROLITHOTOMY Right 11/17/2017   Procedure: NEPHROLITHOTOMY PERCUTANEOUS;  Surgeon: Kathie Rhodes, MD;  [facility];  Service: Urology;  Laterality: Right;  . PACEMAKER INSERTION  03/10/2013   MDT dual chamber PPM  . POCKET REVISION N/A 12/08/2013   Procedure: POCKET REVISION;  Surgeon: Deboraha Sprang, MD;  Location[facility] CATH LAB;  Service: Cardiovascular;  Laterality: N/A;  . PORTA CATH INSERTION    . REVERSE SHOULDER ARTHROPLASTY Right 05/14/2018   Procedure: RIGHT REVERSE SHOULDER ARTHROPLASTY;  Surgeon: Tania Ade, MD;  Location: [place];  Service: Orthopedics;  Laterality: Right;  . REVERSE SHOULDER REPLACEMENT Right 05/14/2018  . RIGHT HEART CATH N/A 07/21/2019   Procedure: RIGHT HEART CATH;  Surgeon: Larey Dresser, MD;  Location: [facility]  INVASIVE CV LAB;  Service: Cardiovascular;  Laterality: N/A;  . TEE WITH CARDIOVERSION  09/22/2017  . TEE WITHOUT CARDIOVERSION N/A 10/03/2017   Procedure: TRANSESOPHAGEAL ECHOCARDIOGRAM (TEE);  Surgeon: Sanda Klein, MD;  Location: [facility] ENDOSCOPY;  Service: Cardiovascular;  Laterality: N/A;  . TEE WITHOUT CARDIOVERSION N/A 08/04/2019   Procedure: TRANSESOPHAGEAL ECHOCARDIOGRAM (TEE);  Surgeon: Larey Dresser, MD;  Location: [facility] ENDOSCOPY;  Service: Cardiovascular;  Laterality: N/A;  . TEE WITHOUT CARDIOVERSION N/A 12/10/2019   Procedure: TRANSESOPHAGEAL ECHOCARDIOGRAM (TEE);  Surgeon: Buford Dresser, MD;  Location: [facility] ENDOSCOPY;  Service: Cardiovascular;  Laterality: N/A;  . THYROIDECTOMY  1998   Dr Margot Chimes  . TONSILLECTOMY    . TOTAL KNEE ARTHROPLASTY  04/13/2012   Procedure: TOTAL KNEE ARTHROPLASTY;  Surgeon: Rudean Haskell, MD;  Location: [place];  Service: Orthopedics;  Laterality: Right;  Marland Kitchen VIDEO BRONCHOSCOPY WITH ENDOBRONCHIAL ULTRASOUND N/A 02/07/2017    Procedure: VIDEO BRONCHOSCOPY WITH ENDOBRONCHIAL ULTRASOUND;  Surgeon: Marshell Garfinkel, MD;  Location: [place];  Service: Pulmonary;  Laterality: N/A;    Assessment & Plan Clinical Impression: Patient is a 75 y.o. right-handed female with history of thyroid cancer, hypothyroidism, NHL/SVC syndrome/lung cancer with radiation therapy as well as chemotherapy 2018 followed by Dr. Lindi Adie, pacemaker placement for atrial fibrillation maintained on Eliquisfollowed by Dr. Caryl Comes, obesity status post lap band surgery, hypertension, hyperlipidemia, obesity with BMI 33.64, recurrent falls. Recent mission CIR 12/22/2020 to 01/09/2021 for functional deficits related to left coronoid process fracture and left hip labral hamstring tendon tear with associated large hematoma requiring I&D and wound VAC. She returned to the ER 01/10/2021 for open wound to left thigh that were bleeding from prior hematoma I&D site which was not actively bleeding and hemoglobin stable she was discharged back to home. Per chart review patient lives alone. Independent with assistive device. 1 level home with level entry. She has good family support. Presented 02/05/2021 with purulent malodorous drainage x1 week from left hip wound dehiscence. Admission chemistries unremarkable aside glucose 103, INR 1.6, lactic acid 1.1, blood cultures no growth to date, hemoglobin 11.2, WBC 8000. Patient underwent incision and drainage of left thigh deep abscess/hematoma excisional debridement of necrotic skin subcutaneous tissue muscle and fascia with revision of closure/drain placement 02/06/2021 per Dr. Melony Overly and wound VAC was placed. Wound cultures after I&D E. coli plus E Faecalisand currently maintained on Unasyn. It was noted to the central part of her wound was a bit dehisced no longer held tightly with suture material consideration being made for revision with washout. Acute blood loss anemia 8.1 and monitored. Sedimentation rate mildly  elevated 45. Anticoagulation with chronic Eliquis for atrial fibrillation currently on hold and does continue on amiodarone at this time. Close monitoring of blood pressure remaining on ProAmatine as well as Florinef. Therapy evaluations completed due to patient's decreased functional mobility recommendations of physical medicine rehab consult. Patient transferred to CIR on 02/13/2021 .    Patient currently requires min with basic self-care skills and IADL secondary to muscle weakness, decreased cardiorespiratoy endurance and atrial fibriliation , decreased coordination and decreased standing balance, decreased postural control and decreased balance strategies.  Prior to hospitalization, patient could complete ADLs and IADLs with modified independent .  Patient will benefit from skilled intervention to increase independence with basic self-care skills and increase level of independence with iADL prior to discharge home independently.  Anticipate patient will require intermittent supervision and follow up home health.  OT - End of Session Activity Tolerance: Tolerates 30+ min activity  with multiple rests Endurance Deficit: Yes Endurance Deficit Description: required frequent rest breaks OT Assessment Rehab Potential (ACUTE ONLY): Good OT Barriers to Discharge: Decreased caregiver support;Weight;Lack of/limited family support;Wound Care OT Patient demonstrates impairments in the following area(s): Edema;Endurance;Motor;Pain;Safety;Sensory;Balance OT Basic ADL's Functional Problem(s): Grooming;Bathing;Dressing;Toileting OT Advanced ADL's Functional Problem(s): Light Housekeeping;Laundry;Simple Meal Preparation OT Transfers Functional Problem(s): Toilet OT Additional Impairment(s): None OT Plan OT Intensity: Minimum of 1-2 x/day, 45 to 90 minutes OT Frequency: Total of 15 hours over 7 days of combined therapies OT Duration/Estimated Length of Stay: 7-10 days OT Treatment/Interventions:  Balance/vestibular training;Discharge planning;DME/adaptive equipment instruction;Therapeutic Exercise;UE/LE Strength taining/ROM;Therapeutic Activities;Skin care/wound managment;Self Care/advanced ADL retraining;Pain management;Patient/family education;Psychosocial support;Functional mobility training;UE/LE Coordination activities;Community reintegration OT Self Feeding Anticipated Outcome(s): NA OT Basic Self-Care Anticipated Outcome(s): Modified Independent OT Toileting Anticipated Outcome(s): Modified Independent OT Bathroom Transfers Anticipated Outcome(s): Modified Independent OT Recommendation Recommendations for Other Services: Therapeutic Recreation consult Therapeutic Recreation Interventions: Clinical cytogeneticist;Outing/community reintergration Patient destination: Home Follow Up Recommendations: Home health OT Equipment Recommended: To be determined   OT Evaluation Precautions/Restrictions  Precautions Precautions: Fall Precaution Comments: has hx of orthostatic hypotension Restrictions Weight Bearing Restrictions: No LUE Weight Bearing: Weight bearing as tolerated LLE Weight Bearing: Weight bearing as tolerated General Response to Previous Treatment: Patient with no complaints from previous session Family/Caregiver Present: No Vital Signs  Pain Pain Assessment Pain Scale: 0-10 Pain Score: 2  Pain Type: Acute pain Pain Location: Leg Pain Orientation: Left Pain Descriptors / Indicators: Aching Pain Frequency: Intermittent Pain Onset: Gradual Patients Stated Pain Goal: 2 Pain Intervention(s): Medication (See eMAR) Home Living/Prior Functioning Home Living Living Arrangements: Alone Available Help at Discharge: Family,Available PRN/intermittently Type of Home: Other(Comment) (condo) Home Access: Level entry Home Layout: One level Bathroom Shower/Tub: Multimedia programmer: Handicapped height Bathroom Accessibility: Yes Additional Comments:  Using a rollator when ambulating around home  Lives With: Alone IADL History Homemaking Responsibilities: Yes Meal Prep Responsibility: Secondary (Friends have been bringing food over) Medical sales representative Responsibility: Primary Cleaning Responsibility: No (Cleaners come twice a week to clean the house) [employer] Paying/Finance Responsibility: Primary Shopping Responsibility: Primary Child Care Responsibility: No Prior Function Level of Independence: Requires assistive device for independence  Able to Take Stairs?: Yes Driving: Yes Vocation: Retired Comments: mod I with rollator Vision Baseline Vision/History: Wears glasses Wears Glasses: Reading only Patient Visual Report: No change from baseline Vision Assessment?: No apparent visual deficits Perception  Perception: Within Functional Limits Praxis Praxis: Intact Cognition Overall Cognitive Status: Within Functional Limits for tasks assessed Arousal/Alertness: Awake/alert Orientation Level: Person;Place;Situation Person: Oriented Place: Oriented Situation: Oriented Year: 2022 Month: May Day of Week: Correct Memory: Appears intact Immediate Memory Recall: Sock;Blue;Bed Memory Recall Sock: Without Cue Memory Recall Blue: Without Cue Memory Recall Bed: Without Cue Attention: Sustained;Selective Sustained Attention: Appears intact Selective Attention: Appears intact Awareness: Appears intact Problem Solving: Appears intact Safety/Judgment: Appears intact Sensation Sensation Light Touch: Impaired by gross assessment Proprioception: Appears Intact Additional Comments: Diminished sensation L>R, neuropathy in LEs and absent sensation along bilateral great toes. Coordination Gross Motor Movements are Fluid and Coordinated: No Fine Motor Movements are Fluid and Coordinated: Yes Coordination and Movement Description: mild uncoordination due to generalized weakness, decreased endurance, and pain Finger Nose Finger Test: [facility]  bilaterally Heel Shin Test: decreased ROM bilaterally due to swelling and lymphedema Motor  Motor Motor: Within Functional Limits Motor - Skilled Clinical Observations: mild uncoordination due to generalized weakness, decreased endurance, and pain  Trunk/Postural Assessment  Cervical Assessment Cervical Assessment: Exceptions to [facility] (forward head)  Thoracic Assessment Thoracic Assessment: Exceptions to [facility] (rounded shoulders) Lumbar Assessment Lumbar Assessment: Exceptions to [facility] (posterior pelvic tilt) Postural Control Postural Control: Deficits on evaluation  Balance Balance Balance Assessed: Yes Static Sitting Balance Static Sitting - Balance Support: Feet supported;No upper extremity supported Static Sitting - Level of Assistance: 7: Independent Dynamic Sitting Balance Dynamic Sitting - Balance Support: Feet supported;No upper extremity supported Dynamic Sitting - Level of Assistance: 6: Modified independent (Device/Increase time) Dynamic Sitting - Balance Activities: Reaching for objects Static Standing Balance Static Standing - Balance Support: Bilateral upper extremity supported (RW) Static Standing - Level of Assistance: 5: Stand by assistance (supervision) Dynamic Standing Balance Dynamic Standing - Balance Support: Bilateral upper extremity supported (RW) Dynamic Standing - Level of Assistance: 5: Stand by assistance (CGA) Extremity/Trunk Assessment RUE Assessment RUE Assessment: Within Functional Limits General Strength Comments: not formally tested, but able to complete self-care tasks and mobility using RUE LUE Assessment LUE Assessment: Within Functional Limits General Strength Comments: not formally tested, but able to complete all self-care tasks and mobility using LUE  Care Tool Care Tool Self Care Eating   Eating Assist Level: Set up assist    Oral Care         Bathing   Body parts bathed by patient: Right arm;Chest;Left arm;Abdomen;Front perineal  area;Right upper leg;Left upper leg;Right lower leg;Left lower leg;Face     Assist Level: Minimal Assistance - Patient > 75%    Upper Body Dressing(including orthotics)   What is the patient wearing?: Pull over shirt   Assist Level: Independent with assistive device    Lower Body Dressing (excluding footwear)   What is the patient wearing?: Pants Assist for lower body dressing: Moderate Assistance - Patient 50 - 74%    Putting on/Taking off footwear   What is the patient wearing?: Shoes Assist for footwear: Moderate Assistance - Patient 50 - 74%       Care Tool Toileting Toileting activity   Assist for toileting: Moderate Assistance - Patient 50 - 74%     Care Tool Bed Mobility Roll left and right activity        Sit to lying activity        Lying to sitting edge of bed activity   Lying to sitting edge of bed assist level: Supervision/Verbal cueing     Care Tool Transfers Sit to stand transfer   Sit to stand assist level: Contact Guard/Touching assist Sit to stand assistive device: Walker  Chair/bed transfer   Chair/bed transfer assist level: Contact Guard/Touching assist Chair/bed transfer assistive device: Barrister's clerk transfer   Assist Level: Maximal Assistance - Patient 24 - 49%     Care Tool Cognition Expression of Ideas and Wants Expression of Ideas and Wants: Without difficulty (complex and basic) - expresses complex messages without difficulty and with speech that is clear and easy to understand   Understanding Verbal and Non-Verbal Content Understanding Verbal and Non-Verbal Content: Understands (complex and basic) - clear comprehension without cues or repetitions   Memory/Recall Ability *first 3 days only Memory/Recall Ability *first 3 days only: Current season;Location of own room;That he or she is in a hospital/hospital unit;Staff names and faces    Refer to Care Plan for [place] 1 OT Short Term Goal 1 (Week 1): STG  = LTG due to ELOS  Recommendations for other services: Surveyor, mining group, Stress management and Outing/community reintegration   Skilled Therapeutic Intervention Met pt lying  in bed receiving care from RN. Pt agreed to OT tx. Treatment session was focused on self-care tasks to increase overall endurance. Pt was able to complete ADLs while sitting EOB, maintaining static sitting balance. Pt is not appropriate for shower due to medical reasons, agreed to bathing sitting EOB. Pt  was supervision for UB bathing and dressing. Pt is Min A for LB bathing and dressing due to inability to maintain static sitting and standing balance. Pt is completely unable to wash bottom without assistance but is able to clean remaining LE. Pt was CGA for sit > stand and stand > sit transfers due to overall weakness. Pt's BP was checked seated EOB before bathing 137/74 and again after transferring to recliner 137/102. Pt reported being light headed after stand pivot transfer from EOB to recliner. Pt is a strong advocate for herself and is knowledgeable enough to communicate her needs. Left pt sitting in recliner with elevated legs, chair alarm on, call bell within reach and notified nurse of BP.    ADL ADL Upper Body Bathing: Supervision/safety Where Assessed-Upper Body Bathing: Edge of bed Lower Body Bathing: Minimal assistance;Other (comment) (Pt unable to clean bottom without assistance) Where Assessed-Lower Body Bathing: Edge of bed Upper Body Dressing: Supervision/safety Where Assessed-Upper Body Dressing: Edge of bed Lower Body Dressing: Minimal assistance Where Assessed-Lower Body Dressing: Edge of bed Toileting: Minimal assistance Where Assessed-Toileting: Bed level Toilet Transfer: Not assessed Toilet Transfer Method: Not assessed Toilet Transfer Equipment: Extra wide bedside commode Tub/Shower Transfer: Not assessed Mobility  Bed Mobility Supine to Sit: Independent Transfers Sit to  Stand: Contact Guard/Touching assist Stand to Sit: Contact Guard/Touching assist   Discharge Criteria: Patient will be discharged from OT if patient refuses treatment 3 consecutive times without medical reason, if treatment goals not met, if there is a change in medical status, if patient makes no progress towards goals or if patient is discharged from hospital.  The above assessment, treatment plan, treatment alternatives and goals were discussed and mutually agreed upon: by patient  Bayard Males 02/14/2021, 1:05 PM

## 2021-02-14 NOTE — Progress Notes (Signed)
Per Dr Caryl Comes, no venous access line recommended on left side , consult to IR for central line

## 2021-02-14 NOTE — Progress Notes (Signed)
Inpatient Rehabilitation Care Coordinator Assessment and Plan Patient Details  Name: Kayla Harrison MRN: [id number] Date of Birth: [birth year]  Today's Date: 02/14/2021  Hospital Problems: Principal Problem:   Debility  Past Medical History:  Past Medical History:  Diagnosis Date  . Anemia   . Arthritis    osteoarthritis - knees and right shoulder  . Blood transfusion without reported diagnosis   . Breast cancer ([place])    Dr Margot Chimes, total thyroidectomy- 1999- for cancer  . 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 Chronic bilateral pleural effusions   . Colon polyp    Dr Earlean Shawl  . Complication of anesthesia    Ketamine produces LSD reaction, bright colored nightmarish experience   . Dyslipidemia   . Endometriosis   . Fibroids   . H/O pleural effusion    s/p thoracentesis w 3252ml withdrawn  . Hepatitis    [place] ?hepatitis   . History of dysphagia    due to radiation therapy  . History of hiatal hernia    small noted on PET scan  . Hypertension   . Hypothyroidism   . Lung cancer, lower lobe ([place]) 01/2017   radiation RX completed 03/04/17; will start chemo 6/27, pt unaware of lung cancer  . Morbid obesity ([place])    Status post lap band surgery  . Nephrolithiasis   . Non Hodgkin's lymphoma ([place])    on chemotherapy  . Persistent atrial fibrillation ([place])    a. s/p PVI 2008 b. s/p convergent ablation 6962 complicated by bradycardia requiring pacemaker implant  . Personal history of radiation therapy   . Presence of permanent cardiac pacemaker   . Rotator cuff tear    Right  . Stroke [facility])    2003- [place] x2  . SVC syndrome    with lung mass and non hodgkins lymphoma  . Thyroid cancer ([place]) 2000   Past Surgical History:  Past Surgical History:  Procedure Laterality Date  . ABDOMINAL HYSTERECTOMY  1983  . afib ablation     a. 2008 PVI b. 2014 convergent ablation  . APPENDECTOMY    . BONE MARROW BIOPSY  02/21/2017  . BREAST LUMPECTOMY Left  2010  . bso  1998  . CARDIAC CATHETERIZATION     2015- negative  . CARDIOVERSION  10/09/2012   Procedure: CARDIOVERSION;  Surgeon: Minus Breeding, MD;  Location: [place];  Service: Cardiovascular;  Laterality: N/A;  . CARDIOVERSION  10/09/2012   Procedure: CARDIOVERSION;  Surgeon: Minus Breeding, MD;  Location: [facility] ENDOSCOPY;  Service: Cardiovascular;  Laterality: N/A;  [name] gave the ok to add pt to the add on , but we must check to find out if the can add pt on at [phone] ( [phone])  . CARDIOVERSION N/A 11/20/2012   Procedure: CARDIOVERSION;  Surgeon: Fay Records, MD;  Location: [place];  Service: Cardiovascular;  Laterality: N/A;  . CARDIOVERSION N/A 07/18/2017   Procedure: CARDIOVERSION;  Surgeon: Thayer Headings, MD;  Location: [facility] ENDOSCOPY;  Service: Cardiovascular;  Laterality: N/A;  . CARDIOVERSION N/A 10/03/2017   Procedure: CARDIOVERSION;  Surgeon: Sanda Klein, MD;  Location: [facility] ENDOSCOPY;  Service: Cardiovascular;  Laterality: N/A;  . CARDIOVERSION N/A 01/07/2018   Procedure: CARDIOVERSION;  Surgeon: Thayer Headings, MD;  Location: [facility] ENDOSCOPY;  Service: Cardiovascular;  Laterality: N/A;  . CARDIOVERSION N/A 12/10/2019   Procedure: CARDIOVERSION;  Surgeon: Buford Dresser, MD;  Location: [facility] ENDOSCOPY;  Service: Cardiovascular;  Laterality: N/A;  . CHOLECYSTECTOMY    .  COLONOSCOPY W/ POLYPECTOMY     Dr Earlean Shawl  . CYSTOSCOPY N/A 02/06/2015   Procedure: CYSTOSCOPY;  Surgeon: Kathie Rhodes, MD;  Location: [facility] ORS;  Service: Urology;  Laterality: N/A;  . CYSTOSCOPY W/ RETROGRADES Left 11/17/2017   Procedure: CYSTOSCOPY WITH RETROGRADE /PYELOGRAM/;  Surgeon: Kathie Rhodes, MD;  Location: [facility];  Service: Urology;  Laterality: Left;  . CYSTOSCOPY WITH RETROGRADE PYELOGRAM, URETEROSCOPY AND STENT PLACEMENT Right 02/06/2015   Procedure: RETROGRADE PYELOGRAM, RIGHT URETEROSCOPY STENT PLACEMENT;  Surgeon: Kathie Rhodes, MD;  Location: [facility];  Service: Urology;  Laterality: Right;  .  CYSTOSCOPY WITH RETROGRADE PYELOGRAM, URETEROSCOPY AND STENT PLACEMENT Right 03/07/2017   Procedure: CYSTOSCOPY WITH RIGHT RETROGRADE PYELOGRAM,RIGHT  URETEROSCOPYLASER LITHOTRIPSY  AND STENT PLACEMENT AND STONE BASKETRY;  Surgeon: Kathie Rhodes, MD;  Location: [place];  Service: Urology;  Laterality: Right;  . EYE SURGERY     cataract surgery  . fatty mass removal  1999   pubic area  . HOLMIUM LASER APPLICATION N/A 0/27/7412   Procedure: HOLMIUM LASER APPLICATION;  Surgeon: Kathie Rhodes, MD;  Location: [facility];  Service: Urology;  Laterality: N/A;  . HOLMIUM LASER APPLICATION Right 8/78/6767   Procedure: HOLMIUM LASER APPLICATION;  Surgeon: Kathie Rhodes, MD;  Location: [facility];  Service: Urology;  Laterality: Right;  . HOLMIUM LASER APPLICATION Left 11/02/4707   Procedure: HOLMIUM LASER APPLICATION;  Surgeon: Kathie Rhodes, MD;  Location: [facility];  Service: Urology;  Laterality: Left;  . I & D EXTREMITY Left 12/19/2020   Procedure: IRRIGATION AND DEBRIDEMENT OF LEFT HIP HEMATOMA WITH APPLICATION OF WOUND VAC;  Surgeon: Erle Crocker, MD;  Location: [place];  Service: Orthopedics;  Laterality: Left;  . I & D EXTREMITY Left 02/06/2021   Procedure: IRRIGATION AND DEBRIDEMENT DEEP ABCESS LEFT THIGH, SECONDARY CLOSURE OF WOUND DEHISCENCE;  Surgeon: Erle Crocker, MD;  Location: [place];  Service: Orthopedics;  Laterality: Left;  . IR FLUORO GUIDE PORT INSERTION RIGHT  02/24/2017  . IR NEPHROSTOMY PLACEMENT RIGHT  11/17/2017  . IR PATIENT EVAL TECH 0-60 MINS  03/11/2017  . IR REMOVAL TUN ACCESS W/ PORT W/O FL MOD SED  04/20/2018  . IR US GUIDE VASC ACCESS RIGHT  02/24/2017  . KNEE ARTHROSCOPY     bilateral  . LAPAROSCOPIC GASTRIC BANDING  07/10/2010  . LAPAROSCOPIC GASTRIC BANDING     Laparoscopic adjustable banding APS System with posterior hiatal hernia, 2 suture.  Marland Kitchen LAPAROTOMY     for ruptured ovary and ovarian artery   . NEPHROLITHOTOMY Right 11/17/2017    Procedure: NEPHROLITHOTOMY PERCUTANEOUS;  Surgeon: Kathie Rhodes, MD;  Location: [facility];  Service: Urology;  Laterality: Right;  . PACEMAKER INSERTION  03/10/2013   MDT dual chamber PPM  . POCKET REVISION N/A 12/08/2013   Procedure: POCKET REVISION;  Surgeon: Deboraha Sprang, MD;  Location: [facility] CATH LAB;  Service: Cardiovascular;  Laterality: N/A;  . PORTA CATH INSERTION    . REVERSE SHOULDER ARTHROPLASTY Right 05/14/2018   Procedure: RIGHT REVERSE SHOULDER ARTHROPLASTY;  Surgeon: Tania Ade, MD;  Location: [place];  Service: Orthopedics;  Laterality: Right;  . REVERSE SHOULDER REPLACEMENT Right 05/14/2018  . RIGHT HEART CATH N/A 07/21/2019   Procedure: RIGHT HEART CATH;  Surgeon: Larey Dresser, MD;  Location: [place] CV LAB;  Service: Cardiovascular;  Laterality: N/A;  . TEE WITH CARDIOVERSION  09/22/2017  . TEE WITHOUT CARDIOVERSION N/A 10/03/2017   Procedure: TRANSESOPHAGEAL ECHOCARDIOGRAM (TEE);  Surgeon: Sanda Klein, MD;  Location:  [facility] ENDOSCOPY;  Service: Cardiovascular;  Laterality: N/A;  . TEE WITHOUT CARDIOVERSION N/A 08/04/2019   Procedure: TRANSESOPHAGEAL ECHOCARDIOGRAM (TEE);  Surgeon: Larey Dresser, MD;  Location: [facility] ENDOSCOPY;  Service: Cardiovascular;  Laterality: N/A;  . TEE WITHOUT CARDIOVERSION N/A 12/10/2019   Procedure: TRANSESOPHAGEAL ECHOCARDIOGRAM (TEE);  Surgeon: Buford Dresser, MD;  Location: [facility] ENDOSCOPY;  Service: Cardiovascular;  Laterality: N/A;  . THYROIDECTOMY  1998   Dr Margot Chimes  . TONSILLECTOMY    . TOTAL KNEE ARTHROPLASTY  04/13/2012   Procedure: TOTAL KNEE ARTHROPLASTY;  Surgeon: Rudean Haskell, MD;  Location: [place];  Service: Orthopedics;  Laterality: Right;  Marland Kitchen VIDEO BRONCHOSCOPY WITH ENDOBRONCHIAL ULTRASOUND N/A 02/07/2017   Procedure: VIDEO BRONCHOSCOPY WITH ENDOBRONCHIAL ULTRASOUND;  Surgeon: Marshell Garfinkel, MD;  Location: [place];  Service: Pulmonary;  Laterality: N/A;   Social History:  reports that she has never smoked. She has  never used smokeless tobacco. She reports that she does not drink alcohol and does not use drugs.  Family / Support Systems Marital Status: Single Other Supports: Mary (Sister), [name] (Friend), Jacqlyn Larsen (Friend) Anticipated Caregiver: Mary Ability/Limitations of Caregiver: Lives in [place] Friends intermittently Caregiver Availability: Intermittent Family Dynamics: Some damily support. Family lives in [place]. Has friends in area  Social History Preferred language: English Religion: Non-Denominational Read: Yes Write: Yes Employment Status: Retired Public relations account executive Issues: n/a Guardian/Conservator: Norva Riffle   Abuse/Neglect Abuse/Neglect Assessment Can Be Completed: Yes Physical Abuse: Denies Verbal Abuse: Denies Sexual Abuse: Denies Exploitation of patient/patient's resources: Denies Self-Neglect: Denies  Emotional Status Recent Psychosocial Issues: coping Psychiatric History: n/a Substance Abuse History: n/a  Patient / Family Perceptions, Expectations & Goals Pt/Family understanding of illness & functional limitations: yes Premorbid pt/family roles/activities: MOD I with RW Anticipated changes in roles/activities/participation: anticipated to meet MOD I goals Pt/family expectations/goals: MOD I to [place] Agencies: Other (Comment) ([place]) Premorbid Home Care/DME Agencies: Other (Comment) Armed forces training and education officer, Civil engineer, contracting, Grab bars, Single point cane) Transportation available at discharge: Pt able to arrange transport Resource referrals recommended: Neuropsychology ([place])  Discharge Planning Living Arrangements: Alone Support Systems: Other relatives (Sister in [place]) Type of Residence: Private residence Insurance Resources: [employer] (specify) Financial Resources: Social Data processing manager Screen Referred: No Living Expenses: Education officer, community Management: Patient Does the patient have any problems obtaining your  medications?: No Home Management: Independent Patient/Family Preliminary Plans: Anticipated to meet MOD I goals Care Coordinator Barriers to Discharge: Wound Care,Lack of/limited family support,Decreased caregiver support Care Coordinator Anticipated Follow Up Needs: HH/OP Expected length of stay: 7-10 Days  Clinical Impression Previous pt, sw met with pt. Provided information. No additional questions or concerns   Dyanne Iha 02/14/2021, 1:40 PM

## 2021-02-14 NOTE — Procedures (Signed)
Interventional Radiology Procedure Note  Procedure: Placement of a right brachial vein (duplicated) PICC.  DL, power injectable. Tip is positioned at the superior cavoatrial junction and catheter is ready for immediate use.  Complications: None Recommendations:  - Ok to use - Do not submerge - Routine line care   Signed,  Dulcy Fanny. Earleen Newport, DO

## 2021-02-14 NOTE — Progress Notes (Addendum)
PROGRESS NOTE   Subjective/Complaints: Pt c/o constipation but no abd pain or nausea, afraid to take Mg Citrate again because it caused diarrhea x 2 d last week Pt does not feel like she is in afib although EKG this am demonstrates this  Discussed vascular access issues with pt and IV RN  ROS- neg CP, SOB, N/V/D Objective:   No results found. Recent Labs    02/14/21 0500  WBC 8.0  HGB 8.6*  HCT 28.3*  PLT 304   Recent Labs    02/14/21 0500  NA 139  K 3.7  CL 103  CO2 30  GLUCOSE 100*  BUN 10  CREATININE 0.89  CALCIUM 8.0*    Intake/Output Summary (Last 24 hours) at 02/14/2021 0752 Last data filed at 02/14/2021 0743 Gross per 24 hour  Intake 200 ml  Output 800 ml  Net -600 ml        Physical Exam: Vital Signs Blood pressure (!) 124/96, pulse 80, temperature 98.5 F (36.9 C), temperature source Oral, resp. rate 14, SpO2 96 %.  General: No acute distress Mood and affect are appropriate Heart: irregular rate and rhythm no rubs murmurs or extra sounds Lungs: Clear to auscultation, breathing unlabored, no rales or wheezes Abdomen: reduced bowel sounds, soft nontender to palpation, nondistended Extremities: No clubbing, cyanosis, or edema Skin: No evidence of breakdown, no evidence of rash Neurologic: Cranial nerves II through XII intact, motor strength is 5/5 in bilateral deltoid, bicep, tricep, grip, hip flexor, 4- RIght and 3- left knee extensors, ankle dorsiflexor and plantar flexor Sensory exam normal sensation to light touch  in bilateral upper and lower extremities  Musculoskeletal: left thigh with swelling Left foot and ankle edema tr/1+    Assessment/Plan: 1. Functional deficits which require 3+ hours per day of interdisciplinary therapy in a comprehensive inpatient rehab setting.  Physiatrist is providing close team supervision and 24 hour management of active medical problems listed  below.  Physiatrist and rehab team continue to assess barriers to discharge/monitor patient progress toward functional and medical goals  Care Tool:  Bathing              Bathing assist       Upper Body Dressing/Undressing Upper body dressing        Upper body assist Assist Level: Set up assist    Lower Body Dressing/Undressing Lower body dressing            Lower body assist Assist for lower body dressing: Moderate Assistance - Patient 50 - 74%     Toileting Toileting    Toileting assist Assist for toileting: Minimal Assistance - Patient > 75%     Transfers Chair/bed transfer  Transfers assist           Locomotion Ambulation   Ambulation assist              Walk 10 feet activity   Assist           Walk 50 feet activity   Assist           Walk 150 feet activity   Assist           Walk  10 feet on uneven surface  activity   Assist           Wheelchair     Assist               Wheelchair 50 feet with 2 turns activity    Assist            Wheelchair 150 feet activity     Assist          Blood pressure (!) 124/96, pulse 80, temperature 98.5 F (36.9 C), temperature source Oral, resp. rate 14, SpO2 96 %.    Medical Problem List and Plan: 1.Debilitysecondary to infected thigh wound hematoma. Status post I&D 02/06/2021 with a wound VAC and drain placement -patient maynot yetshower -ELOS/Goals: 7-10 days, supervision to mod I goals with PT and OT- change to 15/7 pt with reduced exercise tolerance hopefully can upgrade next week  2. Antithrombotics: -DVT/anticoagulation:SCDs. Chronic anticoagulation currently on hold -antiplatelet therapy: N/A 3. Pain Management:Hydrocodone as needed 4. Mood:Provide emotional support -antipsychotic agents: N/A 5. Neuropsych: This patientiscapable of making decisions on [place]rown behalf. 6.  Skin/Wound Care:Routine skin checks 7. Fluids/Electrolytes/Nutrition:Routine in and outs with follow-up chemistries 8. Acute blood loss anemia. Follow-up CBC 9. Atrial fibrillation/pacemaker. Amiodarone 200 mg daily. Cardiac rate is some whatcontrolled. Eliquis on hold due to hematoma -pt is going in and out of a-fib on exam today. Activity tolerance is poor when HR is elevated and irregular.  -f/u with cardiology, will consult for f/u - mild tachy, reviewed 5/25 EKG 10. Orthostasis. Florinef 0.05 mg twice daily, ProAmatine 5 mg twice daily. Monitor with increased mobility 11. ID. E. Coli/E Faecalis. Currently remains on Unasyn. Will need to define duration of antibiotic. -ESR, CRP to monitor status of infection -ID input? 12. Hypothyroidism. Synthroid 13. NHL with SVC syndrome/lung cancer with radiation therapy as well as chemo. Follow-up Dr. Lindi Adie 14. Obesity. Status post lap band surgery. BMI 33.64. Dietary follow-up 15. Hyperlipidemia. Crestor  16.  Vascular access- Needs for IV abx, IV team unable to place midline on RIght , has one for this a IV Abx dose but unsure whether this is approved as a longer term solution given Pacer on Left side, will ask cardiology and ID to weigh in , ask for IR consult  LOS: 1 days A FACE TO [place] E Lennart Gladish 02/14/2021, 7:52 AM

## 2021-02-14 NOTE — Progress Notes (Signed)
Trazodone 25 given last HS for sleep. Not effective for large portion of the night. Patient voiding every 1-2 hours.- denies any burning with urination.

## 2021-02-14 NOTE — Progress Notes (Signed)
Physical Therapy Session Note  Patient Details  Name: Kayla Harrison MRN: [id number] Date of Birth: Dec 14, 1945  Today's Date: 02/14/2021 PT Individual Time: 3817-7116 PT Individual Time Calculation (min): 43 min    Today's Date: 02/14/2021 PT Missed Time: 17 Minutes Missed Time Reason: Unavailable (Comment) (off unit for central line placement)  Short Term Goals: Week 1:  PT Short Term Goal 1 (Week 1): STG=LTG due to LOS  Skilled Therapeutic Interventions/Progress Updates:   Received pt sitting in recliner, pt agreeable to therapy, and reported pain 5/10 in L hip. RN notified and present to administer pain medications. Repositioning, rest breaks, and distraction done to reduce pain levels. Session with emphasis on functional mobility/transfers, generalized strengthening, dynamic standing balance/coordination, ambulation, simulated car transfers, and improved activity tolerance. Stand<>pivot recliner<>WC without AD and CGA and pt transported to 29M ortho gym in [facility] total A for time management purposes. Pt performed simulated car transfer with RW and mod A to get LEs into car. Provided pt with rollator and pt ambulated 57ft x 1 and 76ft x 1 with rollator and CGA. Pt requested to sit due to reports of increased "lightheadedness". NT present stating radiology present to take pt to get central line placed. Pt transported back to room in [facility] total A and ambulated 61ft with rollator and CGA to bed with 1 instance of L knee buckling resulting in pt sitting back down in WC prior to continuing. Sit<>semi-reclined with mod A for BLE management. Pt taken to radiology. 17 minutes missed of skilled physical therapy.   Therapy Documentation Precautions:  Restrictions LLE Weight Bearing: Weight bearing as tolerated  Therapy/Group: Individual Therapy Alfonse Alpers PT, DPT   02/14/2021, 7:42 AM

## 2021-02-15 ENCOUNTER — Encounter (HOSPITAL_COMMUNITY): Payer: Self-pay

## 2021-02-15 DIAGNOSIS — Z1630 Resistance to unspecified antimicrobial drugs: Secondary | ICD-10-CM

## 2021-02-15 DIAGNOSIS — A499 Bacterial infection, unspecified: Secondary | ICD-10-CM

## 2021-02-15 DIAGNOSIS — A4151 Sepsis due to Escherichia coli [E. coli]: Secondary | ICD-10-CM

## 2021-02-15 DIAGNOSIS — I4891 Unspecified atrial fibrillation: Secondary | ICD-10-CM

## 2021-02-15 DIAGNOSIS — B952 Enterococcus as the cause of diseases classified elsewhere: Secondary | ICD-10-CM

## 2021-02-15 DIAGNOSIS — Z2239 Carrier of other specified bacterial diseases: Secondary | ICD-10-CM

## 2021-02-15 NOTE — Progress Notes (Signed)
Occupational Therapy Session Note  Patient Details  Name: Kayla Harrison MRN: [id number] Date of Birth: 1945-10-12  Today's Date: 02/15/2021 OT Individual Time: 1100-1201 OT Individual Time Calculation (min): 61 min    Short Term Goals: Week 1:  OT Short Term Goal 1 (Week 1): STG = LTG due to ELOS  Skilled Therapeutic Interventions/Progress Updates:    Pt received in room in recliner and consented to OT tx. Pt seen for functional transfer and mobility training, and BUE strengthening HEP this session. Pt trained in toileting task with CGA and rollator walker and extensive time for all line management. Pt able to perform clothing mgmt and hygiene with close SUP and assist to manage IV, wound vac, and drains. After toileting, pt instructed in BUE strengthening HEP to increase strength and activity tolerance for ADLs and functional transfers. Instructed in elbow flexion, elbow extension, shoulder press, chest press, shoulder internal/external rotation for 3x15 with min cuing for proper tech with good carryover. Pt required multiple rest breaks due to fatigue. Pt instructed in STS from recliner to rollator walker for 2x5 with min rest breaks in between due to fatigue. After tx, pt left up in recliner with all needs met.   Therapy Documentation Precautions:  Precautions Precautions: Fall Precaution Comments: has hx of orthostatic hypotension Restrictions Weight Bearing Restrictions: No LUE Weight Bearing: Weight bearing as tolerated LLE Weight Bearing: Weight bearing as tolerated   Pain: Pain Assessment Pain Scale: 0-10 Pain Score: 3  Pain Type: Acute pain;Surgical pain Pain Location: Leg Pain Orientation: Left Pain Descriptors / Indicators: Aching Pain Frequency: Intermittent Pain Onset: With Activity Patients Stated Pain Goal: 4 Pain Intervention(s): Medication (See eMAR) (norco given)   Therapy/Group: Individual Therapy  Maurizio Geno 02/15/2021, 12:20 PM

## 2021-02-15 NOTE — Progress Notes (Signed)
Subjective:  Kayla Harrison is concerned because Kayla Harrison has been in atrial fibrillation over the last few days and to her knowledge Kayla Harrison has not had atrial fibrillation for years Kayla Harrison would like to make sure that Dr. Caryl Comes is aware.   Antibiotics:  Anti-infectives (From admission, onward)   Start     Dose/Rate Route Frequency Ordered Stop   02/13/21 1630  Ampicillin-Sulbactam (UNASYN) 3 g in sodium chloride 0.9 % 100 mL IVPB        3 g 200 mL/hr over 30 Minutes Intravenous Every 6 hours 02/13/21 1543        Medications: Scheduled Meds: . amiodarone  200 mg Oral Daily  . Chlorhexidine Gluconate Cloth  6 each Topical BID  . docusate sodium  100 mg Oral BID  . fludrocortisone  0.05 mg Oral BID WC  . levothyroxine  137 mcg Oral Once per day on Sun Mon Tue Wed Thu Fri  . [START ON 02/17/2021] levothyroxine  68.5 mcg Oral Once per day on Sat  . midodrine  5 mg Oral BID WC  . polyethylene glycol  17 g Oral BID  . rosuvastatin  10 mg Oral Once per day on Mon Thu  . sodium chloride flush  10-40 mL Intracatheter Q12H  . sodium chloride flush  10-40 mL Intracatheter Q12H   Continuous Infusions: . ampicillin-sulbactam (UNASYN) IV 200 mL/hr at 02/15/21 1302   PRN Meds:.acetaminophen, bisacodyl, HYDROcodone-acetaminophen, lactulose, ondansetron **OR** ondansetron (ZOFRAN) IV, sodium chloride flush, sodium chloride flush, temazepam    Objective: Weight change:   Intake/Output Summary (Last 24 hours) at 02/15/2021 1417 Last data filed at 02/15/2021 1300 Gross per 24 hour  Intake 640 ml  Output 545 ml  Net 95 ml   Blood pressure 106/66, pulse 100, temperature 97.9 F (36.6 C), temperature source Oral, resp. rate 20, SpO2 100 %. Temp:  [97.9 F (36.6 C)-98.1 F (36.7 C)] 97.9 F (36.6 C) (05/26 1318) Pulse Rate:  [98-100] 100 (05/26 1318) Resp:  [18-20] 20 (05/26 1318) BP: (106-144)/(51-94) 106/66 (05/26 1318) SpO2:  [98 %-100 %] 100 % (05/26 1318)  Physical Exam: Physical  Exam Constitutional:      General: Kayla Harrison is not in acute distress.    Appearance: Kayla Harrison is well-developed. Kayla Harrison is not diaphoretic.  HENT:     Head: Normocephalic and atraumatic.     Right Ear: External ear normal.     Left Ear: External ear normal.     Mouth/Throat:     Pharynx: No oropharyngeal exudate.  Eyes:     General: No scleral icterus.    Conjunctiva/sclera: Conjunctivae normal.     Pupils: Pupils are equal, round, and reactive to light.  Cardiovascular:     Rate and Rhythm: Normal rate. Rhythm irregular.     Heart sounds: Normal heart sounds. No murmur heard. No friction rub. No gallop.   Pulmonary:     Effort: Pulmonary effort is normal. No respiratory distress.     Breath sounds: Normal breath sounds. No wheezing or rales.  Abdominal:     General: Bowel sounds are normal. There is no distension.     Palpations: Abdomen is soft.     Tenderness: There is no abdominal tenderness. There is no rebound.  Musculoskeletal:        General: No tenderness. Normal range of motion.  Lymphadenopathy:     Cervical: No cervical adenopathy.  Skin:    General: Skin is warm and dry.  Coloration: Skin is not pale.     Findings: No erythema or rash.  Neurological:     General: No focal deficit present.     Mental Status: Kayla Harrison is alert and oriented to person, place, and time.     Motor: No abnormal muscle tone.     Coordination: Coordination normal.  Psychiatric:        Mood and Affect: Mood normal.        Behavior: Behavior normal.        Thought Content: Thought content normal.        Judgment: Judgment normal.     Left hip with vacuum dressing   CBC:    BMET Recent Labs    02/14/21 0500  NA 139  K 3.7  CL 103  CO2 30  GLUCOSE 100*  BUN 10  CREATININE 0.89  CALCIUM 8.0*     Liver Panel  Recent Labs    02/14/21 0500  PROT 4.5*  ALBUMIN 2.0*  AST 18  ALT 12  ALKPHOS 62  BILITOT 0.6       Sedimentation Rate No results for input(s): ESRSEDRATE in  the last 72 hours. C-Reactive Protein No results for input(s): CRP in the last 72 hours.  Micro Results: Recent Results (from the past 720 hour(s))  Blood culture (routine x 2)     Status: None   Collection Time: 02/05/21  2:58 PM   Specimen: BLOOD LEFT ARM  Result Value Ref Range Status   Specimen Description BLOOD LEFT ARM  Final   Special Requests   Final    BOTTLES DRAWN AEROBIC AND ANAEROBIC Blood Culture adequate volume   Culture   Final    NO GROWTH 6 DAYS Performed at [facility], [address], [place], [place] [address]    Report Status 02/11/2021 FINAL  Final  Aerobic Culture w Gram Stain (superficial specimen)     Status: None   Collection Time: 02/05/21  3:38 PM   Specimen: Wound  Result Value Ref Range Status   Specimen Description WOUND  Final   Special Requests NONE  Final   Gram Stain   Final    ABUNDANT WBC PRESENT, PREDOMINANTLY PMN ABUNDANT GRAM NEGATIVE RODS MODERATE GRAM POSITIVE COCCI Performed at [facility], [place] [address] [place], [place] [address]    Culture   Final    MODERATE ESCHERICHIA COLI MODERATE ENTEROCOCCUS FAECALIS    Report Status 02/09/2021 FINAL  Final   Organism ID, Bacteria ESCHERICHIA COLI  Final   Organism ID, Bacteria ENTEROCOCCUS FAECALIS  Final      Susceptibility   Escherichia coli - MIC*    AMPICILLIN >=32 RESISTANT Resistant     CEFAZOLIN <=4 SENSITIVE Sensitive     CEFEPIME <=0.12 SENSITIVE Sensitive     CEFTAZIDIME <=1 SENSITIVE Sensitive     CEFTRIAXONE <=0.25 SENSITIVE Sensitive     CIPROFLOXACIN <=0.25 SENSITIVE Sensitive     GENTAMICIN <=1 SENSITIVE Sensitive     IMIPENEM <=0.25 SENSITIVE Sensitive     TRIMETH/SULFA >=320 RESISTANT Resistant     AMPICILLIN/SULBACTAM 16 INTERMEDIATE Intermediate     PIP/TAZO <=4 SENSITIVE Sensitive     * MODERATE ESCHERICHIA COLI   Enterococcus faecalis - MIC*    AMPICILLIN <=2 SENSITIVE Sensitive     VANCOMYCIN 1 SENSITIVE Sensitive     GENTAMICIN SYNERGY  RESISTANT Resistant     * MODERATE ENTEROCOCCUS FAECALIS  Resp Panel by RT-PCR (Flu A&B, Covid) Nasopharyngeal Swab  Status: None   Collection Time: 02/05/21  4:00 PM   Specimen: Nasopharyngeal Swab; Nasopharyngeal(NP) swabs in vial transport medium  Result Value Ref Range Status   SARS Coronavirus 2 by RT PCR NEGATIVE NEGATIVE Final    Comment: (NOTE) SARS-CoV-2 target nucleic acids are NOT DETECTED.  The SARS-CoV-2 RNA is generally detectable in upper respiratory specimens during the acute phase of infection. The lowest concentration of SARS-CoV-2 viral copies this assay can detect is 138 copies/mL. A negative result does not preclude SARS-Cov-2 infection and should not be used as the sole basis for treatment or other patient management decisions. A negative result may occur with  improper specimen collection/handling, submission of specimen other than nasopharyngeal swab, presence of viral mutation(s) within the areas targeted by this assay, and inadequate number of viral copies(<138 copies/mL). A negative result must be combined with clinical observations, patient history, and epidemiological information. The expected result is Negative.  Fact Sheet for Patients:  EntrepreneurPulse.com.au  Fact Sheet for Healthcare Providers:  IncredibleEmployment.be  This test is no t yet approved or cleared by the [place] FDA and  has been authorized for detection and/or diagnosis of SARS-CoV-2 by FDA under an Emergency Use Authorization (EUA). This EUA will remain  in effect (meaning this test can be used) for the duration of the COVID-19 declaration under Section 564(b)(1) of the Act, 21 U.S.C.section 360bbb-3(b)(1), unless the authorization is terminated  or revoked sooner.       Influenza A by PCR NEGATIVE NEGATIVE Final   Influenza B by PCR NEGATIVE NEGATIVE Final    Comment: (NOTE) The Xpert Xpress SARS-CoV-2/FLU/RSV plus assay is  intended as an aid in the diagnosis of influenza from Nasopharyngeal swab specimens and should not be used as a sole basis for treatment. Nasal washings and aspirates are unacceptable for Xpert Xpress SARS-CoV-2/FLU/RSV testing.  Fact Sheet for Patients: EntrepreneurPulse.com.au  Fact Sheet for Healthcare Providers: IncredibleEmployment.be  This test is not yet approved or cleared by the [place] FDA and has been authorized for detection and/or diagnosis of SARS-CoV-2 by FDA under an Emergency Use Authorization (EUA). This EUA will remain in effect (meaning this test can be used) for the duration of the COVID-19 declaration under Section 564(b)(1) of the Act, 21 U.S.C. section 360bbb-3(b)(1), unless the authorization is terminated or revoked.  Performed at [facility], [place] [address], [place], [place] [address]   Surgical pcr screen     Status: None   Collection Time: 02/06/21  1:51 AM   Specimen: Nasal Mucosa; Nasal Swab  Result Value Ref Range Status   MRSA, PCR NEGATIVE NEGATIVE Final   Staphylococcus aureus NEGATIVE NEGATIVE Final    Comment: (NOTE) The Xpert SA Assay (FDA approved for NASAL specimens in patients 10 years of age and older), is one component of a comprehensive surveillance program. It is not intended to diagnose infection nor to guide or monitor treatment. Performed at [facility], [place] [address] [place], [place] [address]   Culture, blood (Routine X 2) w Reflex to ID Panel     Status: None   Collection Time: 02/06/21  5:29 AM   Specimen: BLOOD RIGHT HAND  Result Value Ref Range Status   Specimen Description BLOOD RIGHT HAND  Final   Special Requests   Final    BOTTLES DRAWN AEROBIC AND ANAEROBIC Blood Culture adequate volume   Culture   Final    NO GROWTH 5 DAYS Performed at [facility], [place]  [address] [place], [place] [address]    Report Status 02/11/2021 FINAL  Final   Aerobic/Anaerobic Culture w Gram Stain (surgical/deep wound)     Status: None (Preliminary result)   Collection Time: 02/06/21  9:47 AM   Specimen: PATH Other; Tissue  Result Value Ref Range Status   Specimen Description TISSUE  Final   Special Requests LEFT HIP ABSC SPEC A  Final   Gram Stain   Final    FEW WBC PRESENT,BOTH PMN AND MONONUCLEAR MODERATE GRAM POSITIVE COCCI RARE GRAM NEGATIVE RODS    Culture   Final    ABUNDANT ESCHERICHIA COLI ABUNDANT ENTEROCOCCUS FAECALIS MODERATE ACINETOBACTER CALCOACETICUS/BAUMANNII COMPLEX MODERATE BACTEROIDES THETAIOTAOMICRON BETA LACTAMASE POSITIVE Sent to [facility] for further susceptibility testing. Performed at [facility], [place] [address], [place], [place] [address]    Report Status PENDING  Incomplete   Organism ID, Bacteria ESCHERICHIA COLI  Final   Organism ID, Bacteria ENTEROCOCCUS FAECALIS  Final   Organism ID, Bacteria ACINETOBACTER CALCOACETICUS/BAUMANNII COMPLEX  Final      Susceptibility   Acinetobacter calcoaceticus/baumannii complex - MIC*    CEFTAZIDIME >=64 RESISTANT Resistant     CIPROFLOXACIN >=4 RESISTANT Resistant     GENTAMICIN <=1 SENSITIVE Sensitive     IMIPENEM >=16 RESISTANT Resistant     PIP/TAZO >=128 RESISTANT Resistant     TRIMETH/SULFA >=320 RESISTANT Resistant     AMPICILLIN/SULBACTAM 8 SENSITIVE Sensitive     * MODERATE ACINETOBACTER CALCOACETICUS/BAUMANNII COMPLEX   Escherichia coli - MIC*    AMPICILLIN >=32 RESISTANT Resistant     CEFAZOLIN <=4 SENSITIVE Sensitive     CEFEPIME <=0.12 SENSITIVE Sensitive     CEFTAZIDIME <=1 SENSITIVE Sensitive     CEFTRIAXONE <=0.25 SENSITIVE Sensitive     CIPROFLOXACIN <=0.25 SENSITIVE Sensitive     GENTAMICIN <=1 SENSITIVE Sensitive     IMIPENEM <=0.25 SENSITIVE Sensitive     TRIMETH/SULFA >=320 RESISTANT Resistant     AMPICILLIN/SULBACTAM 8 SENSITIVE Sensitive     PIP/TAZO <=4 SENSITIVE Sensitive     * ABUNDANT ESCHERICHIA COLI   Enterococcus faecalis -  MIC*    AMPICILLIN <=2 SENSITIVE Sensitive     VANCOMYCIN 1 SENSITIVE Sensitive     GENTAMICIN SYNERGY RESISTANT Resistant     * ABUNDANT ENTEROCOCCUS FAECALIS    Studies/Results: IR PICC PLACEMENT RIGHT >5 YRS INC IMG GUIDE  Result Date: 02/14/2021 INDICATION: 75 year old female with a history of infection referred for PICC EXAM: IMAGE GUIDED PLACEMENT OF RIGHT UPPER EXTREMITY PICC MEDICATIONS: None ANESTHESIA/SEDATION: None FLUOROSCOPY TIME:  Fluoroscopy Time: 0 minutes 6 seconds (1 mGy). COMPLICATIONS: None PROCEDURE: Informed written consent was obtained from the patient after a thorough discussion of the procedural risks, benefits and alternatives. All questions were addressed. Maximal Sterile Barrier Technique was utilized including caps, mask, sterile gowns, sterile gloves, sterile drape, hand hygiene and skin antiseptic. A timeout was performed prior to the initiation of the procedure. Patient was position in the supine position on the fluoroscopy table with the right arm abducted 90 degrees. Ultrasound survey of the upper extremity was performed with images stored and sent to PACs. The right brachial vein was selected for access. Dual/parallel brachial veins. Once the patient was prepped and draped in the usual sterile fashion, the skin and subcutaneous tissues were generously infiltrated with 1% lidocaine for local anesthesia. A micropuncture access kit was then used to access the targeted vein. Wire was passed centrally, confirmed to be within the venous system under fluoroscopy. A small stab incision was  made with an 11 blade scalpel and the sheath was then placed over the wire. Estimated length of the catheter was then performed with the indwelling wire. Catheter was amputated at 37 cm length and placed with coaxial wire through the peel-away. Double lumen, power injectable PICC in the right brachial vein. Tip confirmed at the cavoatrial junction, and the catheter is ready for use. Stat lock  was placed. Patient tolerated the procedure well and remained hemodynamically stable throughout. No complications were encountered and no significant blood loss was encountered. IMPRESSION: Status post right upper extremity PICC. Signed, Dulcy Fanny. Dellia Nims, RPVI [facility] Electronically Signed   By: Corrie Mckusick D.O.   On: 02/14/2021 17:04      Assessment/Plan:  INTERVAL HISTORY: Kayla Harrison has had interventional radiology guided PICC placement   Principal Problem:   Debility    Kayla Harrison is a 75 y.o. female with polymicrobial soft tissue abscess with Enterococcus faecalis E. coli and multidrug resistant Acinetobacter isolated on culture.  1.  Polymicrobial abscess  Continue Unasyn since the sulbactam is active against her Acinetobacter  Of asked the microbiology lab to run minocycline susceptibilities on this Acinetobacter species  2 Atrial fibrillation: I will see if I can secure chat Dr. Caryl Comes or send him a message re her concerns re atrial fibrillation.   LOS: 2 days   Alcide Evener 02/15/2021, 2:17 PM

## 2021-02-15 NOTE — Progress Notes (Incomplete)
Patient Care Team: [name], MD as PCP - General (Endocrinology) Deboraha Sprang, MD as PCP - Cardiology (Cardiology) [name], MD (Endocrinology)   HPI  Kayla Harrison is a 75 y.o. female Seen in followup for a pacemaker implanted at [facility] and atrial fibrillation. She underwent pulmonary vein isolation at [facility] and had recurrent atrial fibrillation and underwent convergent ablation at [facility] 1/74; it was complicated by heart block prompting pacemaker implantation.  She has had recurrent aFib and  was restarted on amio--and then stopped 2/2 worsening SOB amiodarone resumed and then adjunctive ranolazine  She has a history of prior strokes and remains on oral anticoagulation .  Symptomatic orthostasis; tried an abdominal binder without improvement.   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  Atrial fibrillation with rapid rates having presented with a fall.  Brain imaging was concerning initially for a possible brain lesion; neurosurgery and MRI was negative.  Amiodarone dose was increased with a reload.  She was discharged for outpatient cardioversion. 12/10/19  In her previous visit she some minimal dyspnea with walking. She has seen a vein specialist and has been diagnosed with lymphedema of her right greater than left leg.  They have talked about pumps.  She has no bleeding on Eliquis, however it is very costly. Rivaroxaban was also stopped-not able to tolerate.   She is doing as well as she has some ***third.    Today, ***  The patient denies chest pain***, shortness of breath***, nocturnal dyspnea***, orthopnea*** or peripheral edema***.  There have been no palpitations***, lightheadedness*** or syncope***.  Complains of ***.   DATE TEST EF   2011 Echo  55 % LAE 40 mm  7/14 Echo  55-60 %   9/18 Echo  55-60%   12/18 Echo  55-60% Severe LAE ?   11/20 TEE 55%  moderate MR/TR mild MS      Date Cr K TSH ALT Hgb  8/18   0.2 15 11.9  9/18                       9.3  3/19   0.722 (12/18) 23 11.2  4/20    18 13.2  11/20 1.22 4.5 0.74 (7/20) 17(7/20) 11.9  3/21 1.06 4.2 0.82 19 12.8   5/22 0.89 3.7  12 8.9     Remote morbid obesity status>>  post lap band surgery   Thromboembolic risk factors ( age -62 , TIA/CVA-2, Gender-1) for a CHADSVASc Score of 5   Past Medical History:  Diagnosis Date  . Anemia   . Arthritis    osteoarthritis - knees and right shoulder  . Blood transfusion without reported diagnosis   . Breast cancer ([place])    Dr Margot Chimes, total thyroidectomy- 1999- for cancer  . 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 Chronic bilateral pleural effusions   . Colon polyp    Dr Earlean Shawl  . Complication of anesthesia    Ketamine produces LSD reaction, bright colored nightmarish experience   . Dyslipidemia   . Endometriosis   . Fibroids   . H/O pleural effusion    s/p thoracentesis w 3282m withdrawn  . Hepatitis    [place] ?hepatitis   . History of dysphagia    due to radiation therapy  . History of hiatal hernia  small noted on PET scan  . Hypertension   . Hypothyroidism   . Lung cancer, lower lobe ([place]) 01/2017   radiation RX completed 03/04/17; will start chemo 6/27, pt unaware of lung cancer  . Morbid obesity ([place])    Status post lap band surgery  . Nephrolithiasis   . Non Hodgkin's lymphoma ([place])    on chemotherapy  . Persistent atrial fibrillation ([place])    a. s/p PVI 2008 b. s/p convergent ablation 7035 complicated by bradycardia requiring pacemaker implant  . Personal history of radiation therapy   . Presence of permanent cardiac pacemaker   . Rotator cuff tear    Right  . Stroke [facility])    2003- [place] x2  . SVC syndrome    with lung mass and non hodgkins lymphoma  . Thyroid cancer ([place]) 2000    Past Surgical History:  Procedure Laterality Date  . ABDOMINAL HYSTERECTOMY  1983   . afib ablation     a. 2008 PVI b. 2014 convergent ablation  . APPENDECTOMY    . BONE MARROW BIOPSY  02/21/2017  . BREAST LUMPECTOMY Left 2010  . bso  1998  . CARDIAC CATHETERIZATION     2015- negative  . CARDIOVERSION  10/09/2012   Procedure: CARDIOVERSION;  Surgeon: Minus Breeding, MD;  Location: [place];  Service: Cardiovascular;  Laterality: N/A;  . CARDIOVERSION  10/09/2012   Procedure: CARDIOVERSION;  Surgeon: Minus Breeding, MD;  Location: [facility] ENDOSCOPY;  Service: Cardiovascular;  Laterality: N/A;  [name] gave the ok to add pt to the add on , but we must check to find out if the can add pt on at [phone] ( [phone])  . CARDIOVERSION N/A 11/20/2012   Procedure: CARDIOVERSION;  Surgeon: Fay Records, MD;  Location: [place];  Service: Cardiovascular;  Laterality: N/A;  . CARDIOVERSION N/A 07/18/2017   Procedure: CARDIOVERSION;  Surgeon: Thayer Headings, MD;  Location: [facility] ENDOSCOPY;  Service: Cardiovascular;  Laterality: N/A;  . CARDIOVERSION N/A 10/03/2017   Procedure: CARDIOVERSION;  Surgeon: Sanda Klein, MD;  Location: [facility] ENDOSCOPY;  Service: Cardiovascular;  Laterality: N/A;  . CARDIOVERSION N/A 01/07/2018   Procedure: CARDIOVERSION;  Surgeon: Thayer Headings, MD;  Location: [facility] ENDOSCOPY;  Service: Cardiovascular;  Laterality: N/A;  . CARDIOVERSION N/A 12/10/2019   Procedure: CARDIOVERSION;  Surgeon: Buford Dresser, MD;  Location: [facility] ENDOSCOPY;  Service: Cardiovascular;  Laterality: N/A;  . CHOLECYSTECTOMY    . COLONOSCOPY W/ POLYPECTOMY     Dr Earlean Shawl  . CYSTOSCOPY N/A 02/06/2015   Procedure: CYSTOSCOPY;  Surgeon: Kathie Rhodes, MD;  Location: [facility] ORS;  Service: Urology;  Laterality: N/A;  . CYSTOSCOPY W/ RETROGRADES Left 11/17/2017   Procedure: CYSTOSCOPY WITH RETROGRADE /PYELOGRAM/;  Surgeon: Kathie Rhodes, MD;  Location: [facility];  Service: Urology;  Laterality: Left;  . CYSTOSCOPY WITH RETROGRADE PYELOGRAM, URETEROSCOPY AND STENT PLACEMENT Right 02/06/2015   Procedure:  RETROGRADE PYELOGRAM, RIGHT URETEROSCOPY STENT PLACEMENT;  Surgeon: Kathie Rhodes, MD;  Location: [facility];  Service: Urology;  Laterality: Right;  . CYSTOSCOPY WITH RETROGRADE PYELOGRAM, URETEROSCOPY AND STENT PLACEMENT Right 03/07/2017   Procedure: CYSTOSCOPY WITH RIGHT RETROGRADE PYELOGRAM,RIGHT  URETEROSCOPYLASER LITHOTRIPSY  AND STENT PLACEMENT AND STONE BASKETRY;  Surgeon: Kathie Rhodes, MD;  Location: [place];  Service: Urology;  Laterality: Right;  . EYE SURGERY     cataract surgery  . fatty mass removal  1999   pubic area  . HOLMIUM LASER APPLICATION N/A 0/05/3817   Procedure: HOLMIUM LASER APPLICATION;  Surgeon: Kathie Rhodes,  MD;  Location: [facility];  Service: Urology;  Laterality: N/A;  . HOLMIUM LASER APPLICATION Right 1/61/0960   Procedure: HOLMIUM LASER APPLICATION;  Surgeon: Kathie Rhodes, MD;  Location: [facility];  Service: Urology;  Laterality: Right;  . HOLMIUM LASER APPLICATION Left 4/54/0981   Procedure: HOLMIUM LASER APPLICATION;  Surgeon: Kathie Rhodes, MD;  Location: [facility];  Service: Urology;  Laterality: Left;  . I & D EXTREMITY Left 12/19/2020   Procedure: IRRIGATION AND DEBRIDEMENT OF LEFT HIP HEMATOMA WITH APPLICATION OF WOUND VAC;  Surgeon: Erle Crocker, MD;  Location: [place];  Service: Orthopedics;  Laterality: Left;  . I & D EXTREMITY Left 02/06/2021   Procedure: IRRIGATION AND DEBRIDEMENT DEEP ABCESS LEFT THIGH, SECONDARY CLOSURE OF WOUND DEHISCENCE;  Surgeon: Erle Crocker, MD;  Location: [place];  Service: Orthopedics;  Laterality: Left;  . IR FLUORO GUIDE PORT INSERTION RIGHT  02/24/2017  . IR NEPHROSTOMY PLACEMENT RIGHT  11/17/2017  . IR PATIENT EVAL TECH 0-60 MINS  03/11/2017  . IR REMOVAL TUN ACCESS W/ PORT W/O FL MOD SED  04/20/2018  . IR US GUIDE VASC ACCESS RIGHT  02/24/2017  . KNEE ARTHROSCOPY     bilateral  . LAPAROSCOPIC GASTRIC BANDING  07/10/2010  . LAPAROSCOPIC GASTRIC BANDING     Laparoscopic adjustable banding  APS System with posterior hiatal hernia, 2 suture.  Marland Kitchen LAPAROTOMY     for ruptured ovary and ovarian artery   . NEPHROLITHOTOMY Right 11/17/2017   Procedure: NEPHROLITHOTOMY PERCUTANEOUS;  Surgeon: Kathie Rhodes, MD;  [facility]: [facility];  Service: Urology;  Laterality: Right;  . PACEMAKER INSERTION  03/10/2013   MDT dual chamber PPM  . POCKET REVISION N/A 12/08/2013   Procedure: POCKET REVISION;  Surgeon: Deboraha Sprang, MD;  Location: [facility] CATH LAB;  Service: Cardiovascular;  Laterality: N/A;  . PORTA CATH INSERTION    . REVERSE SHOULDER ARTHROPLASTY Right 05/14/2018   Procedure: RIGHT REVERSE SHOULDER ARTHROPLASTY;  Surgeon: Tania Ade, MD;  Location: [place];  Service: Orthopedics;  Laterality: Right;  . REVERSE SHOULDER REPLACEMENT Right 05/14/2018  . RIGHT HEART CATH N/A 07/21/2019   Procedure: RIGHT HEART CATH;  Surgeon: Larey Dresser, MD;  Location: [place] CV LAB;  Service: Cardiovascular;  Laterality: N/A;  . TEE WITH CARDIOVERSION  09/22/2017  . TEE WITHOUT CARDIOVERSION N/A 10/03/2017   Procedure: TRANSESOPHAGEAL ECHOCARDIOGRAM (TEE);  Surgeon: Sanda Klein, MD;  Location: [facility] ENDOSCOPY;  Service: Cardiovascular;  Laterality: N/A;  . TEE WITHOUT CARDIOVERSION N/A 08/04/2019   Procedure: TRANSESOPHAGEAL ECHOCARDIOGRAM (TEE);  Surgeon: Larey Dresser, MD;  Location: [facility] ENDOSCOPY;  Service: Cardiovascular;  Laterality: N/A;  . TEE WITHOUT CARDIOVERSION N/A 12/10/2019   Procedure: TRANSESOPHAGEAL ECHOCARDIOGRAM (TEE);  Surgeon: Buford Dresser, MD;  Location: [facility] ENDOSCOPY;  Service: Cardiovascular;  Laterality: N/A;  . THYROIDECTOMY  1998   Dr Margot Chimes  . TONSILLECTOMY    . TOTAL KNEE ARTHROPLASTY  04/13/2012   Procedure: TOTAL KNEE ARTHROPLASTY;  Surgeon: Rudean Haskell, MD;  Location: [place];  Service: Orthopedics;  Laterality: Right;  Marland Kitchen VIDEO BRONCHOSCOPY WITH ENDOBRONCHIAL ULTRASOUND N/A 02/07/2017   Procedure: VIDEO BRONCHOSCOPY WITH ENDOBRONCHIAL ULTRASOUND;  Surgeon:  Marshell Garfinkel, MD;  Location: [place];  Service: Pulmonary;  Laterality: N/A;    No current facility-administered medications for this visit.   Current Outpatient Medications  Medication Sig Dispense Refill  . amiodarone (PACERONE) 200 MG tablet TAKE 1 TABLET BY MOUTH  DAILY 90 tablet 3   Facility-Administered Medications Ordered in  Other Visits  Medication Dose Route Frequency Provider Last Rate Last Admin  . acetaminophen (TYLENOL) tablet 650 mg  650 mg Oral Q6H PRN Angiulli, Lavon Paganini, PA-C      . amiodarone (PACERONE) tablet 200 mg  200 mg Oral Daily Cathlyn Parsons, PA-C   200 mg at 02/15/21 4497  . Ampicillin-Sulbactam (UNASYN) 3 g in sodium chloride 0.9 % 100 mL IVPB  3 g Intravenous Q6H Tommy Medal, Lavell Islam, MD 200 mL/hr at 02/15/21 0601 3 g at 02/15/21 0601  . bisacodyl (DULCOLAX) suppository 10 mg  10 mg Rectal Daily PRN Cathlyn Parsons, PA-C      . Chlorhexidine Gluconate Cloth 2 % PADS 6 each  6 each Topical BID Kirsteins, Luanna Salk, MD   6 each at 02/15/21 0545  . docusate sodium (COLACE) capsule 100 mg  100 mg Oral BID Cathlyn Parsons, PA-C   100 mg at 02/15/21 0830  . fludrocortisone (FLORINEF) tablet 0.05 mg  0.05 mg Oral BID WC Angiulli, Lavon Paganini, PA-C   0.05 mg at 02/15/21 5300  . HYDROcodone-acetaminophen (NORCO/VICODIN) 5-325 MG per tablet 1-2 tablet  1-2 tablet Oral Q4H PRN Cathlyn Parsons, PA-C   1 tablet at 02/15/21 0830  . lactulose (CHRONULAC) 10 GM/15ML solution 30 g  30 g Oral Daily PRN Cathlyn Parsons, PA-C   30 g at 02/14/21 0831  . levothyroxine (SYNTHROID) tablet 137 mcg  137 mcg Oral Once per day on Sun Mon Tue Wed Thu Fri Cathlyn Parsons, PA-C   137 mcg at 02/15/21 5110  . [START ON 02/17/2021] levothyroxine (SYNTHROID) tablet 68.5 mcg  68.5 mcg Oral Once per day on Sat Angiulli, Daniel J, PA-C      . midodrine (PROAMATINE) tablet 5 mg  5 mg Oral BID WC Angiulli, Lavon Paganini, PA-C   5 mg at 02/15/21 0830  . ondansetron (ZOFRAN) tablet 4 mg  4 mg  Oral Q6H PRN Angiulli, Lavon Paganini, PA-C       Or  . ondansetron [facility]) injection 4 mg  4 mg Intravenous Q6H PRN Angiulli, Lavon Paganini, PA-C      . polyethylene glycol (MIRALAX / GLYCOLAX) packet 17 g  17 g Oral BID Cathlyn Parsons, PA-C   17 g at 02/15/21 0830  . rosuvastatin (CRESTOR) tablet 10 mg  10 mg Oral Once per day on Mon Thu Angiulli, Daniel J, PA-C      . sodium chloride flush (NS) 0.9 % injection 10-40 mL  10-40 mL Intracatheter Q12H Raulkar, Clide Deutscher, MD      . sodium chloride flush (NS) 0.9 % injection 10-40 mL  10-40 mL Intracatheter PRN Raulkar, Clide Deutscher, MD      . sodium chloride flush (NS) 0.9 % injection 10-40 mL  10-40 mL Intracatheter Q12H Raulkar, Clide Deutscher, MD      . sodium chloride flush (NS) 0.9 % injection 10-40 mL  10-40 mL Intracatheter PRN Raulkar, Clide Deutscher, MD      . temazepam (RESTORIL) capsule 7.5 mg  7.5 mg Oral QHS PRN Charlett Blake, MD   7.5 mg at 02/14/21 2042    Allergies  Allergen Reactions  . Dofetilide Other (See Comments)    Prolonged QT interval   . Ranolazine     Balance issues  . Rivaroxaban Other (See Comments)    Nose Bleed X 6 hrs ; packing in ER Nasal hemorrhage  . Rivaroxaban     Nose Bleed X 6 hrs ;  packing in ER Nasal hemorrhage  . Tape Other (See Comments)    SKIN IS THIN AND TEARS EASILY   . Tikosyn [Dofetilide] Other (See Comments)    "Long QT wave"   . Epinephrine Other (See Comments)    Oral anesthetic Dental form only (liquid). Patient stated she will become "out of it" she can hear you but cannot respond in normal fashion. "not fully with it"  . Epinephrine   . Ketamine Other (See Comments)    Hallucinations   . Adhesive [Tape] Rash    Paper tape as well  . Ketamine Other (See Comments)    HALLUCINATIONS    Review of Systems negative except from HPI and PMH  Physical Exam There were no vitals taken for this visit. Well developed and well nourished in no acute distress HENT normal Neck supple with  JVP-flat Clear Device pocket well healed; without hematoma or erythema.  There is no tethering  Regular rate and rhythm, no *** gallop No ***/*** murmur Abd-soft with active BS No Clubbing cyanosis *** edema Skin-warm and dry A & Oriented  Grossly normal sensory and motor function  ECG ***    Assessment and  Plan  Atrial fibrillation/flutter  S/p convergent ablation persistent  Prior Strokes  Pacemaker Med[employer]onic MRI compatible   Non-Hodgkin's lymphoma on chemotherapy  Dyspnea on exertion     Treated hypothyroidism  High Risk Medication Surveillance-amiodarone  Junctional rhythm   Orthostatic hypotension     No interval afib of nte ( Atrial rate 180 s dur 4 h)  Tolerating amio willneed labs   On Anticoagulation;  No bleeding issues   Orthostasis much improved.  Device interrogation demonstrates "no activity "and this relates to our having made her threshold considerably higher and with this attenuated her symptoms.  Please see last office note    I,Alexis Bryant,acting as a scribe for [name], MD.,have documented all relevant documentation on the behalf of [name], MD,as directed by  [name], MD while in the presence of [name], MD.  ***

## 2021-02-15 NOTE — Progress Notes (Addendum)
Physical Therapy Session Note  Patient Details  Name: Kayla Harrison MRN: [id number] Date of Birth: [birth year]  Today's Date: 02/15/2021 PT Individual Time: 0350-0938 PT Individual Time Calculation (min): 30 min   Short Term Goals: Week 1:  PT Short Term Goal 1 (Week 1): STG=LTG due to LOS  Skilled Therapeutic Interventions/Progress Updates:    Patient seated in bed with sister in the room.  Reports currently no pain.  Patient supine to sit with elevated HOB with S.  Performed sit to stand to rollator and S.  Patient ambulated with CGA to S x 150'.  Seated rest in hallway on rollator.  Patient able to transition to sitting with CGA.  Patient ambulated x 70' with rollator and CGA.  Then at wall rail performed side stepping with S, standing marching, and hamstring curls x 10.  Patient ambulated another 56' with S to [place] with rollator.  Left seated in recliner with call bell in reach, seat alarm active and sister in the room.   Therapy Documentation Precautions:  Precautions Precautions: Fall Precaution Comments: has hx of orthostatic hypotension Restrictions Weight Bearing Restrictions: No LUE Weight Bearing: Weight bearing as tolerated LLE Weight Bearing: Weight bearing as tolerated Pain: Pain Assessment Pain Score: 0-No pain Faces Pain Scale: Hurts a little bit   Therapy/Group: Individual Therapy  Reginia Naas  [place], PT 02/15/2021, 5:10 PM

## 2021-02-15 NOTE — Evaluation (Signed)
Recreational Therapy Assessment and Plan  Patient Details  Name: Kayla Harrison MRN: [id number] Date of Birth: 09-28-1945 Today's Date: 02/15/2021  Rehab Potential:  Good ELOS:   7-10 days  Assessment  Hospital Problem: Principal Problem:   Debility   Past Medical History:      Past Medical History:  Diagnosis Date  . Anemia   . Arthritis    osteoarthritis - knees and right shoulder  . Blood transfusion without reported diagnosis   . Breast cancer ([place])    Dr Margot Chimes, total thyroidectomy- 1999- for cancer  . 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 Chronic bilateral pleural effusions   . Colon polyp    Dr Earlean Shawl  . Complication of anesthesia    Ketamine produces LSD reaction, bright colored nightmarish experience   . Dyslipidemia   . Endometriosis   . Fibroids   . H/O pleural effusion    s/p thoracentesis w 3233m withdrawn  . Hepatitis    [place] ?hepatitis   . History of dysphagia    due to radiation therapy  . History of hiatal hernia    small noted on PET scan  . Hypertension   . Hypothyroidism   . Lung cancer, [place] lobe ([place]   radiation RX completed 03/04/17; will start chemo 6/27, pt unaware of lung cancer  . Morbid obesity ([place]    Status post lap band surgery  . Nephrolithiasis   . [place]kin's [place]    on chemotherapy  . Persistent atrial fibrillation ([place]    a. s/p PVI 2008 b. s/p convergent ablation 21761complicated by bradycardia requiring pacemaker implant  . Personal history of radiation therapy   . Presence of permanent cardiac pacemaker   . Rotator cuff tear    Right  . Stroke ([facility] [address]    [place]  . SVC syndrome    with lung mass and non hodgkins lymphoma  . Thyroid cancer ([place]   Past Surgical History:       Past Surgical History:  Procedure Laterality Date  . ABDOMINAL HYSTERECTOMY  1983  . afib ablation     a. 2008 PVI b. 2014  convergent ablation  . APPENDECTOMY    . BONE MARROW BIOPSY  02/21/2017  . BREAST LUMPECTOMY Left 2010  . bso  1998  . CARDIAC CATHETERIZATION     2015- negative  . CARDIOVERSION  10/09/2012   Procedure: CARDIOVERSION;  Surgeon: JMinus Breeding MD;  [place]  Service: Cardiovascular;  Laterality: N/A;  . CARDIOVERSION  10/09/2012   Procedure: CARDIOVERSION;  Surgeon: JMinus Breeding MD;  [facility];  Service: Cardiovascular;  Laterality: N/A;  [name] the ok to add pt to the add on , but we must check to find out if the can add pt on at [phone] ( [phone])  . CARDIOVERSION N/A 11/20/2012   Procedure: CARDIOVERSION;  Surgeon: PFay Records MD;  [place]  Service: Cardiovascular;  Laterality: N/A;  . CARDIOVERSION N/A 07/18/2017   Procedure: CARDIOVERSION;  Surgeon: NThayer Headings MD;  [facility]  Service: Cardiovascular;  Laterality: N/A;  . CARDIOVERSION N/A 10/03/2017   Procedure: CARDIOVERSION;  Surgeon: CSanda Klein MD;  [facility];  Service: Cardiovascular;  Laterality: N/A;  . CARDIOVERSION N/A 01/07/2018   Procedure: CARDIOVERSION;  Surgeon: NThayer Headings MD;  [facility];  Service: Cardiovascular;  Laterality: N/A;  . CARDIOVERSION N/A 12/10/2019   Procedure: CARDIOVERSION;  Surgeon:  Buford Dresser, MD;  [facility];  Service: Cardiovascular;  Laterality: N/A;  . CHOLECYSTECTOMY    . COLONOSCOPY W/ POLYPECTOMY     Dr Earlean Shawl  . CYSTOSCOPY N/A 02/06/2015   Procedure: CYSTOSCOPY;  Surgeon: Kathie Rhodes, MD;  [facility];  Service: Urology;  Laterality: N/A;  . CYSTOSCOPY W/ RETROGRADES Left 11/17/2017   Procedure: CYSTOSCOPY WITH RETROGRADE /PYELOGRAM/;  Surgeon: Kathie Rhodes, MD;  [facility];  Service: Urology;  Laterality: Left;  . CYSTOSCOPY WITH RETROGRADE PYELOGRAM, URETEROSCOPY AND STENT PLACEMENT Right 02/06/2015   Procedure: RETROGRADE PYELOGRAM, RIGHT  URETEROSCOPY STENT PLACEMENT;  Surgeon: Kathie Rhodes, MD;  [facility];  Service: Urology;  Laterality: Right;  . CYSTOSCOPY WITH RETROGRADE PYELOGRAM, URETEROSCOPY AND STENT PLACEMENT Right 03/07/2017   Procedure: CYSTOSCOPY WITH RIGHT RETROGRADE PYELOGRAM,RIGHT  URETEROSCOPYLASER LITHOTRIPSY  AND STENT PLACEMENT AND STONE BASKETRY;  Surgeon: Kathie Rhodes, MD;  Location: [place];  Service: Urology;  Laterality: Right;  . EYE SURGERY     cataract surgery  . fatty mass removal  1999   pubic area  . HOLMIUM LASER APPLICATION N/A 6/71/2458   Procedure: HOLMIUM LASER APPLICATION;  Surgeon: Kathie Rhodes, MD;  [facility];  Service: Urology;  Laterality: N/A;  . HOLMIUM LASER APPLICATION Right 0/99/8338   Procedure: HOLMIUM LASER APPLICATION;  Surgeon: Kathie Rhodes, MD;  Location: [facility];  Service: Urology;  Laterality: Right;  . HOLMIUM LASER APPLICATION Left 2/50/5397   Procedure: HOLMIUM LASER APPLICATION;  Surgeon: Kathie Rhodes, MD;  [facility];  Service: Urology;  Laterality: Left;  . I & D EXTREMITY Left 12/19/2020   Procedure: IRRIGATION AND DEBRIDEMENT OF LEFT HIP HEMATOMA WITH APPLICATION OF WOUND VAC;  Surgeon: Erle Crocker, MD;  Location: [place];  Service: Orthopedics;  Laterality: Left;  . I & D EXTREMITY Left 02/06/2021   Procedure: IRRIGATION AND DEBRIDEMENT DEEP ABCESS LEFT THIGH, SECONDARY CLOSURE OF WOUND DEHISCENCE;  Surgeon: Erle Crocker, MD;  Location: [place];  Service: Orthopedics;  Laterality: Left;  . IR FLUORO GUIDE PORT INSERTION RIGHT  02/24/2017  . IR NEPHROSTOMY PLACEMENT RIGHT  11/17/2017  . IR PATIENT EVAL TECH 0-60 MINS  03/11/2017  . IR REMOVAL TUN ACCESS W/ PORT W/O FL MOD SED  04/20/2018  . IR US GUIDE VASC ACCESS RIGHT  02/24/2017  . KNEE ARTHROSCOPY     bilateral  . LAPAROSCOPIC GASTRIC BANDING  07/10/2010  . LAPAROSCOPIC GASTRIC BANDING     Laparoscopic adjustable banding APS  System with posterior hiatal hernia, 2 suture.  Marland Kitchen LAPAROTOMY     for ruptured ovary and ovarian artery   . NEPHROLITHOTOMY Right 11/17/2017   Procedure: NEPHROLITHOTOMY PERCUTANEOUS;  Surgeon: Kathie Rhodes, MD;  [facility];  Service: Urology;  Laterality: Right;  . PACEMAKER INSERTION  03/10/2013   MDT dual chamber PPM  . POCKET REVISION N/A 12/08/2013   Procedure: POCKET REVISION;  Surgeon: Deboraha Sprang, MD;  Location: [facility] CATH LAB;  Service: Cardiovascular;  Laterality: N/A;  . PORTA CATH INSERTION    . REVERSE SHOULDER ARTHROPLASTY Right 05/14/2018   Procedure: RIGHT REVERSE SHOULDER ARTHROPLASTY;  Surgeon: Tania Ade, MD;  Location: [place];  Service: Orthopedics;  Laterality: Right;  . REVERSE SHOULDER REPLACEMENT Right 05/14/2018  . RIGHT HEART CATH N/A 07/21/2019   Procedure: RIGHT HEART CATH;  Surgeon: Larey Dresser, MD;  Location: [place] CV LAB;  Service: Cardiovascular;  Laterality: N/A;  . TEE WITH CARDIOVERSION  09/22/2017  .  TEE WITHOUT CARDIOVERSION N/A 10/03/2017   Procedure: TRANSESOPHAGEAL ECHOCARDIOGRAM (TEE);  Surgeon: Sanda Klein, MD;  Location[facility] ENDOSCOPY;  Service: Cardiovascular;  Laterality: N/A;  . TEE WITHOUT CARDIOVERSION N/A 08/04/2019   Procedure: TRANSESOPHAGEAL ECHOCARDIOGRAM (TEE);  Surgeon: Larey Dresser, MD;  Location: [facility] ENDOSCOPY;  Service: Cardiovascular;  Laterality: N/A;  . TEE WITHOUT CARDIOVERSION N/A 12/10/2019   Procedure: TRANSESOPHAGEAL ECHOCARDIOGRAM (TEE);  Surgeon: Buford Dresser, MD;  Location: [facility] ENDOSCOPY;  Service: Cardiovascular;  Laterality: N/A;  . THYROIDECTOMY  1998   Dr Margot Chimes  . TONSILLECTOMY    . TOTAL KNEE ARTHROPLASTY  04/13/2012   Procedure: TOTAL KNEE ARTHROPLASTY;  Surgeon: Rudean Haskell, MD;  Location: [place];  Service: Orthopedics;  Laterality: Right;  Marland Kitchen VIDEO BRONCHOSCOPY WITH ENDOBRONCHIAL ULTRASOUND N/A 02/07/2017   Procedure: VIDEO BRONCHOSCOPY WITH ENDOBRONCHIAL  ULTRASOUND;  Surgeon: Marshell Garfinkel, MD;  Location: [place];  Service: Pulmonary;  Laterality: N/A;    Assessment & Plan Clinical Impression: Patient is a 75 y.o. right-handed female with history of thyroid cancer, hypothyroidism, NHL/SVC syndrome/lung cancer with radiation therapy as well as chemotherapy 2018 followed by Dr. Lindi Adie, pacemaker placement for atrial fibrillation maintained on Eliquisfollowed by Dr. Caryl Comes, obesity status post lap band surgery, hypertension, hyperlipidemia, obesity with BMI 33.64, recurrent falls. Recent mission CIR 12/22/2020 to 01/09/2021 for functional deficits related to left coronoid process fracture and left hip labral hamstring tendon tear with associated large hematoma requiring I&D and wound VAC. She returned to the ER 01/10/2021 for open wound to left thigh that were bleeding from prior hematoma I&D site which was not actively bleeding and hemoglobin stable she was discharged back to home. Per chart review patient lives alone. Independent with assistive device. 1 level home with level entry. She has good family support. Presented 02/05/2021 with purulent malodorous drainage x1 week from left hip wound dehiscence. Admission chemistries unremarkable aside glucose 103, INR 1.6, lactic acid 1.1, blood cultures no growth to date, hemoglobin 11.2, WBC 8000. Patient underwent incision and drainage of left thigh deep abscess/hematoma excisional debridement of necrotic skin subcutaneous tissue muscle and fascia with revision of closure/drain placement 02/06/2021 per Dr. Melony Overly and wound VAC was placed. Wound cultures after I&D E. coli plus E Faecalisand currently maintained on Unasyn. It was noted to the central part of her wound was a bit dehisced no longer held tightly with suture material consideration being made for revision with washout. Acute blood loss anemia 8.1 and monitored. Sedimentation rate mildly elevated 45. Anticoagulation with chronic  Eliquis for atrial fibrillation currently on hold and does continue on amiodarone at this time. Close monitoring of blood pressure remaining on ProAmatine as well as Florinef. Therapy evaluations completed due to patient's decreased functional mobility recommendations of physical medicine rehab consult. Patient transferred to CIR on 02/13/2021.     Pt presents with decreased activity tolerance, decreased functional mobility, decreased balance, feelings of anxiety/being overwhelmed Limiting pt's independence with leisure/community pursuits.  Met with pt today, sister present to discuss leisure interests, coping strategies.  Reviewed the use of diaphagmatic breathing and introduced aromatherapy.  Pt stated understanding and is is agreeable to try aromatherapy in the hopes of increasing night time rest and relaxation.  Discussed the use of aromatherapy as a therapy intervention including type of oil used, how it would be delivered, desired effect and complications from use including headache  2 drops of lavender essential oil was applied to a cottonball  for inhalation in the room.  Nursing alerted about pts  use of aromatherapy and instructed to remove essential oil from the room  if pt requesting removal and/or complains of headache.  Pt stated understanding and is agreement with the above.  Plan  Min 1 tr session >20 minutes per week during LOS  Recommendations for other services: Neuropsych  Discharge Criteria: Patient will be discharged from TR if patient refuses treatment 3 consecutive times without medical reason.  If treatment goals not met, if there is a change in medical status, if patient makes no progress towards goals or if patient is discharged from hospital.  The above assessment, treatment plan, treatment alternatives and goals were discussed and mutually agreed upon: by patient  [place] 02/15/2021, 3:45 PM

## 2021-02-15 NOTE — Progress Notes (Signed)
PROGRESS NOTE   Subjective/Complaints: C/o left lower extremity edema and pain at her hematoma, well controlled with norco and this does not make her drowsy. PICC successfully placed Ambulated 45 feet with supervision this morning with Anna  ROS- neg CP, SOB, N/V/D, +left lower extremity edema  Objective:   IR PICC PLACEMENT RIGHT >5 YRS INC IMG GUIDE  Result Date: 02/14/2021 INDICATION: 75 year old female with a history of infection referred for PICC EXAM: IMAGE GUIDED PLACEMENT OF RIGHT UPPER EXTREMITY PICC MEDICATIONS: None ANESTHESIA/SEDATION: None FLUOROSCOPY TIME:  Fluoroscopy Time: 0 minutes 6 seconds (1 mGy). COMPLICATIONS: None PROCEDURE: Informed written consent was obtained from the patient after a thorough discussion of the procedural risks, benefits and alternatives. All questions were addressed. Maximal Sterile Barrier Technique was utilized including caps, mask, sterile gowns, sterile gloves, sterile drape, hand hygiene and skin antiseptic. A timeout was performed prior to the initiation of the procedure. Patient was position in the supine position on the fluoroscopy table with the right arm abducted 90 degrees. Ultrasound survey of the upper extremity was performed with images stored and sent to PACs. The right brachial vein was selected for access. Dual/parallel brachial veins. Once the patient was prepped and draped in the usual sterile fashion, the skin and subcutaneous tissues were generously infiltrated with 1% lidocaine for local anesthesia. A micropuncture access kit was then used to access the targeted vein. Wire was passed centrally, confirmed to be within the venous system under fluoroscopy. A small stab incision was made with an 11 blade scalpel and the sheath was then placed over the wire. Estimated length of the catheter was then performed with the indwelling wire. Catheter was amputated at 37 cm length and placed  with coaxial wire through the peel-away. Double lumen, power injectable PICC in the right brachial vein. Tip confirmed at the cavoatrial junction, and the catheter is ready for use. Stat lock was placed. Patient tolerated the procedure well and remained hemodynamically stable throughout. No complications were encountered and no significant blood loss was encountered. IMPRESSION: Status post right upper extremity PICC. Signed, Dulcy Fanny. Dellia Nims, RPVI [facility] Radiology Electronically Signed   By: Corrie Mckusick D.O.   On: 02/14/2021 17:04   Recent Labs    02/14/21 0500  WBC 8.0  HGB 8.6*  HCT 28.3*  PLT 304   Recent Labs    02/14/21 0500  NA 139  K 3.7  CL 103  CO2 30  GLUCOSE 100*  BUN 10  CREATININE 0.89  CALCIUM 8.0*    Intake/Output Summary (Last 24 hours) at 02/15/2021 1058 Last data filed at 02/15/2021 0830 Gross per 24 hour  Intake 780 ml  Output 550 ml  Net 230 ml        Physical Exam: Vital Signs Blood pressure (!) 144/87, pulse 100, temperature 97.9 F (36.6 C), temperature source Oral, resp. rate 18, SpO2 100 %. Gen: no distress, normal appearing HEENT: oral mucosa pink and moist, NCAT Cardio: Reg rate Chest: normal effort, normal rate of breathing Abd: soft, non-distended Ext: no edema Psych: pleasant, normal affect Skin: intact Neurologic: Cranial nerves II through XII intact, motor strength is 5/5  in bilateral deltoid, bicep, tricep, grip, hip flexor, 4- RIght and 3- left knee extensors, ankle dorsiflexor and plantar flexor Sensory exam normal sensation to light touch  in bilateral upper and lower extremities  Musculoskeletal: left thigh with swelling Left foot and ankle edema tr/1+    Assessment/Plan: 1. Functional deficits which require 3+ hours per day of interdisciplinary therapy in a comprehensive inpatient rehab setting.  Physiatrist is providing close team supervision and 24 hour management  of active medical problems listed below.  Physiatrist and rehab team continue to assess barriers to discharge/monitor patient progress toward functional and medical goals  Care Tool:  Bathing    Body parts bathed by patient: Right arm,Chest,Left arm,Abdomen,Front perineal area,Right upper leg,Left upper leg,Right lower leg,Left lower leg,Face         Bathing assist Assist Level: Minimal Assistance - Patient > 75%     Upper Body Dressing/Undressing Upper body dressing   What is the patient wearing?: Pull over shirt    Upper body assist Assist Level: Independent with assistive device    Lower Body Dressing/Undressing Lower body dressing      What is the patient wearing?: Pants     Lower body assist Assist for lower body dressing: Moderate Assistance - Patient 50 - 74%     Toileting Toileting    Toileting assist Assist for toileting: Moderate Assistance - Patient 50 - 74%     Transfers Chair/bed transfer  Transfers assist     Chair/bed transfer assist level: Contact Guard/Touching assist Chair/bed transfer assistive device: Other (rollator)   Locomotion Ambulation   Ambulation assist      Assist level: Contact Guard/Touching assist Assistive device: Rollator Max distance: 21ft   Walk 10 feet activity   Assist     Assist level: Contact Guard/Touching assist Assistive device: Rollator   Walk 50 feet activity   Assist Walk 50 feet with 2 turns activity did not occur: Safety/medical concerns (fatigue, weakness, decreased balance)         Walk 150 feet activity   Assist Walk 150 feet activity did not occur: Safety/medical concerns (fatigue, weakness, decreased balance)         Walk 10 feet on uneven surface  activity   Assist Walk 10 feet on uneven surfaces activity did not occur: Safety/medical concerns (fatigue, weakness, decreased balance)         Wheelchair     Assist Will patient use wheelchair at discharge?: No              Wheelchair 50 feet with 2 turns activity    Assist            Wheelchair 150 feet activity     Assist          Blood pressure (!) 144/87, pulse 100, temperature 97.9 F (36.6 C), temperature source Oral, resp. rate 18, SpO2 100 %.    Medical Problem List and Plan: 1.Debilitysecondary to infected thigh wound hematoma. Status post I&D 02/06/2021 with a wound VAC and drain placement -patient maynot yetshower -ELOS/Goals: 7-10 days, supervision to mod I goals with PT and OT- change to 15/7 pt with reduced exercise tolerance hopefully can upgrade next week   -continue CIR 2. Antithrombotics: -DVT/anticoagulation:SCDs. Chronic anticoagulation currently on hold -antiplatelet therapy: N/A 3. Post-operative paint:Hydrocodone as needed- continue 4. Mood:Provide emotional support -antipsychotic agents: N/A 5. Neuropsych: This patientiscapable of making decisions on herown behalf. 6. Skin/Wound Care:Routine skin checks 7. Fluids/Electrolytes/Nutrition:Routine in and outs with follow-up chemistries 8. Acute  blood loss anemia. Discussed Hgb 8.6, repeat Monday.  9. Atrial fibrillation/pacemaker. Continue Amiodarone 200 mg daily. Cardiac rate is some whatcontrolled. Eliquis on hold due to hematoma -pt is going in and out of a-fib on exam today. Activity tolerance is poor when HR is elevated and irregular.  -f/u with cardiology, will consult for f/u - mild tachy, reviewed 5/25 EKG 10. Orthostasis. Florinef 0.05 mg twice daily, ProAmatine 5 mg twice daily. Monitor with increased mobility 11. ID. E. Coli/E Faecalis. Currently remains on Unasyn. Will need to define duration of antibiotic. -ESR, CRP to monitor status of infection -ID input? 12. Hypothyroidism. Synthroid 13. NHL with SVC syndrome/lung cancer with radiation therapy as  well as chemo. Follow-up Dr. Lindi Adie 14. Obesity. Status post lap band surgery. BMI 33.64. Dietary follow-up 15. Hyperlipidemia. Crestor  16.  Vascular access- PICC placed for IV abx  LOS: 2 days A FACE TO FACE EVALUATION WAS PERFORMED  Clide Deutscher Josslynn Mentzer 02/15/2021, 10:58 AM

## 2021-02-15 NOTE — Progress Notes (Signed)
Patient ID: Kayla Harrison, female   DOB: [birth year], 75 y.o.   MRN: [id number] Met with the patient and her sister to introduce the role of the nurse CM and initiate education. Continue to follow along to discharge to assess educational needs and collaborate with the SW to facilitate preparation for discharge. Handouts given to patient on JP drain care and ESBL but hopeful to have JP drains removed prior to discharge. Margarito Liner

## 2021-02-15 NOTE — Progress Notes (Signed)
Physical Therapy Session Note  Patient Details  Name: Kayla Harrison MRN: [id number] Date of Birth: 1946/09/21  Today's Date: 02/15/2021 PT Individual Time: 0800-0855 PT Individual Time Calculation (min): 55 min   Short Term Goals: Week 1:  PT Short Term Goal 1 (Week 1): STG=LTG due to LOS  Skilled Therapeutic Interventions/Progress Updates:   Received pt sitting in recliner, pt agreeable to therapy, and reported pain 5/10 in L lateral hip. RN notified and present to administer medication. Repositioning, rest breaks, and distraction done to reduce pain level. Pt reported sleeping much better last night and ready for therapy this morning. Session with emphasis on dressing, functional mobility/transfers, generalized strengthening, dynamic standing balance/coordination, gait training, and improved activity tolerance. Donned pants sitting with supervision, transferred sit<>stand with rollator and CGA and required min A to pull pants over hips with total A to manage wound vac and drains. Washed face sitting in recliner with set up assist. RN notified to assist in threading IV through clothes (unable to disconnect due to PICC line) and pt donned bra with supervision and pull over shirt with mod A due to line management. Pt reported feeling "exhausted" after getting dressed requiring seated rest break and appeared frustrated with her CLOF, LE weakness, and equipment in room and small room size. Sit<>stand with rollator and CGA in preparation for ambulation and reported increased lightheadedness; returned to sitting and required ~2 minute seated rest break prior to ambulating in hall. Pt then ambulated 32ft x 2 trials with rollator and CGA/supervision. Pt required 1 seated rest break on rollator due to fatigue and reported feeling "exhausted". MD present for morning rounds and to inspect wound while pt standing. Pt transferred sit<>supine with supervision. Concluded session with pt supine in bed, needs  within reach, and bed alarm on.    Therapy Documentation Precautions:  Precautions Precautions: Fall Precaution Comments: has hx of orthostatic hypotension Restrictions Weight Bearing Restrictions: No LUE Weight Bearing: Weight bearing as tolerated LLE Weight Bearing: Weight bearing as tolerated  Therapy/Group: Individual Therapy Alfonse Alpers PT, DPT   02/15/2021, 7:15 AM

## 2021-02-16 ENCOUNTER — Encounter: Payer: Medicare Other | Admitting: Internal Medicine

## 2021-02-16 ENCOUNTER — Other Ambulatory Visit: Payer: Self-pay | Admitting: Internal Medicine

## 2021-02-16 MED ORDER — SENNA 8.6 MG PO TABS
2.0000 | ORAL_TABLET | Freq: Every day | ORAL | Status: DC
  Administered 2021-02-16 – 2021-02-22 (×6): 17.2 mg via ORAL
  Filled 2021-02-16 (×6): qty 2

## 2021-02-16 NOTE — Progress Notes (Addendum)
     Kayla Harrison is a 75 y.o. female   Orthopaedic diagnosis: Status post revision wound closure with debridement of left thigh wound.  VAC placement.  On 5/17  Subjective: Patient is seen in rehab.  Wound VAC was removed.  She has been more mobile.  Denies worsening pain.  Overall feels good.  Objectyive: Vitals:   02/16/21 0346 02/16/21 1423  BP: (!) 141/87 129/76  Pulse: 100 (!) 101  Resp: 18 20  Temp: 98 F (36.7 C) 98.2 F (36.8 C)  SpO2: 98% 97%     Exam: Awake and alert Respirations even and unlabored No acute distress  Wound VAC was removed.  There is a small central portion of wound dehiscence.  There is clot within the wound.  No expressible purulence.  The clot was expressed.  The drain was visible within the wound.  Both drains were removed.  There is some induration distally and posteriorly to the original incision.  No tenderness to palpation in this area.  No overall fluctuance noted.  Assessment: Status post I&D and revision closure of left thigh abscess and wound with VAC placement on 02/06/2021   Plan: The VAC was removed today.  I removed both of the JP drains.  There was some clot within the small area of wound dehiscence centrally about the wound.  The clot was removed and the wound was irrigated with normal saline.  I also irrigated the drain holes.  A small VAC sponge was placed within the dehisced part of the wound and a overlying VAC sponge was placed.  The wound VAC was replaced.  There was good suction and small amount of output.  We will plan to keep this VAC on over the weekend.  We will plan for VAC change early next week.  I am hopeful that she will not have any further necrosis of the tissue around the area of the incision.  The rest of the incision had healed well.  Repeat ESR and CRP have been ordered.  Procedure: Negative pressure wound VAC was placed about the incision and the area of dehiscence of the wound.  2 pieces of sponge were  used.  The wound was irrigated prior to [facility] placement and hematoma was evacuated.  No expressible purulence.  No foul odor with the exception of hematoma.  She tolerated the VAC placement well.  VAC was placed to a Prevena wound VAC.   Radene Journey, MD

## 2021-02-16 NOTE — Progress Notes (Signed)
Physical Therapy Session Note  Patient Details  Name: CARLON DAVIDSON MRN: [id number] Date of Birth: 08-26-1946  Today's Date: 02/16/2021 PT Individual Time: 9381-8299 PT Individual Time Calculation (min): 69 min   Short Term Goals: Week 1:  PT Short Term Goal 1 (Week 1): STG=LTG due to LOS  Skilled Therapeutic Interventions/Progress Updates:   Received pt sitting in recliner, pt agreeable to therapy, and reported pain 1/10 in L Lateral hip with mobility (premedicated). Repositioning, rest breaks, and distraction done to reduce pain levels. Pt requested to brush teeth and wash her hair prior to leaving room. Session with emphasis on functional mobility/transfers, generalized strengthening, dynamic standing balance/coordination, gait training, stair navigation, and improved activity tolerance. Pt ambulated 15ft with rollator and supervision to sink and sat on rollator and brushed teeth, washed face, and washed/dryed hair with set up assist and cues for rollator safety/brake management. Donned different shoes with max A for time management purposes and pt ambulated 126ft x 1, 49ft x 2, 137ft x 1, 57ft x 1, 60ft x 1, and 64ft x 1 using rollator and supervision to/from 4W therapy gym with total A to manage IV pole with increased time due to multiple rest breaks. Pt demonstrates decreased cadence, wide BOS, and decreased trunk rotation and required multiple seated rest breaks on rollator due to fatigue and mild "lightheadedness". Pt requires min cues for rollator safety to ensure proper brake management. Pt then navigated 1 6in step x 3 trials with 2 handrails and CGA. Concluded session with pt sitting in recliner with all needs within reach.   Therapy Documentation Precautions:  Precautions Precautions: Fall Precaution Comments: has hx of orthostatic hypotension Restrictions Weight Bearing Restrictions: No LUE Weight Bearing: Weight bearing as tolerated LLE Weight Bearing: Weight bearing as  tolerated  Therapy/Group: Individual Therapy Alfonse Alpers PT, DPT   02/16/2021, 7:31 AM

## 2021-02-16 NOTE — Progress Notes (Signed)
PROGRESS NOTE   Subjective/Complaints: Slept better last night with lavender but has woken at night for IV change and to urinate.  She asks about her ESR and CRP levels- discussed and ordered repeat for Monday.   ROS- denies CP, SOB, N/V/D, +left lower extremity edema, +lower extremity weakness  Objective:   IR PICC PLACEMENT RIGHT >5 YRS INC IMG GUIDE  Result Date: 02/14/2021 INDICATION: 75 year old female with a history of infection referred for PICC EXAM: IMAGE GUIDED PLACEMENT OF RIGHT UPPER EXTREMITY PICC MEDICATIONS: None ANESTHESIA/SEDATION: None FLUOROSCOPY TIME:  Fluoroscopy Time: 0 minutes 6 seconds (1 mGy). COMPLICATIONS: None PROCEDURE: Informed written consent was obtained from the patient after a thorough discussion of the procedural risks, benefits and alternatives. All questions were addressed. Maximal Sterile Barrier Technique was utilized including caps, mask, sterile gowns, sterile gloves, sterile drape, hand hygiene and skin antiseptic. A timeout was performed prior to the initiation of the procedure. Patient was position in the supine position on the fluoroscopy table with the right arm abducted 90 degrees. Ultrasound survey of the upper extremity was performed with images stored and sent to PACs. The right brachial vein was selected for access. Dual/parallel brachial veins. Once the patient was prepped and draped in the usual sterile fashion, the skin and subcutaneous tissues were generously infiltrated with 1% lidocaine for local anesthesia. A micropuncture access kit was then used to access the targeted vein. Wire was passed centrally, confirmed to be within the venous system under fluoroscopy. A small stab incision was made with an 11 blade scalpel and the sheath was then placed over the wire. Estimated length of the catheter was then performed with the indwelling wire. Catheter was amputated at 37 cm length and placed  with coaxial wire through the peel-away. Double lumen, power injectable PICC in the right brachial vein. Tip confirmed at the cavoatrial junction, and the catheter is ready for use. Stat lock was placed. Patient tolerated the procedure well and remained hemodynamically stable throughout. No complications were encountered and no significant blood loss was encountered. IMPRESSION: Status post right upper extremity PICC. Signed, Dulcy Fanny. Dellia Nims, RPVI [facility] Radiology Electronically Signed   By: Corrie Mckusick D.O.   On: 02/14/2021 17:04   Recent Labs    02/14/21 0500  WBC 8.0  HGB 8.6*  HCT 28.3*  PLT 304   Recent Labs    02/14/21 0500  NA 139  K 3.7  CL 103  CO2 30  GLUCOSE 100*  BUN 10  CREATININE 0.89  CALCIUM 8.0*    Intake/Output Summary (Last 24 hours) at 02/16/2021 7672 Last data filed at 02/16/2021 0947 Gross per 24 hour  Intake 400 ml  Output 477.5 ml  Net -77.5 ml        Physical Exam: Vital Signs Blood pressure (!) 141/87, pulse 100, temperature 98 F (36.7 C), temperature source Oral, resp. rate 18, SpO2 98 %. Gen: no distress, normal appearing HEENT: oral mucosa pink and moist, NCAT Cardio: Reg rate Chest: normal effort, normal rate of breathing Abd: soft, non-distended Ext: no edema Psych: pleasant, normal affect Skin: intact Neurologic: Cranial nerves II through XII intact, motor  strength is 5/5 in bilateral deltoid, bicep, tricep, grip, hip flexor, 4- RIght and 3- left knee extensors, ankle dorsiflexor and plantar flexor Sensory exam normal sensation to light touch  in bilateral upper and lower extremities  Musculoskeletal: left thigh with swelling Left foot and ankle edema tr/1+    Assessment/Plan: 1. Functional deficits which require 3+ hours per day of interdisciplinary therapy in a comprehensive inpatient rehab setting.  Physiatrist is providing close team supervision and 24 hour management  of active medical problems listed below.  Physiatrist and rehab team continue to assess barriers to discharge/monitor patient progress toward functional and medical goals  Care Tool:  Bathing    Body parts bathed by patient: Right arm,Chest,Left arm,Abdomen,Front perineal area,Right upper leg,Left upper leg,Right lower leg,Left lower leg,Face         Bathing assist Assist Level: Minimal Assistance - Patient > 75%     Upper Body Dressing/Undressing Upper body dressing   What is the patient wearing?: Pull over shirt    Upper body assist Assist Level: Independent with assistive device    Lower Body Dressing/Undressing Lower body dressing      What is the patient wearing?: Pants     Lower body assist Assist for lower body dressing: Moderate Assistance - Patient 50 - 74%     Toileting Toileting    Toileting assist Assist for toileting: Moderate Assistance - Patient 50 - 74%     Transfers Chair/bed transfer  Transfers assist     Chair/bed transfer assist level: Contact Guard/Touching assist Chair/bed transfer assistive device: Environmental consultant (rollator)   Locomotion Ambulation   Ambulation assist      Assist level: Contact Guard/Touching assist Assistive device: Rollator Max distance: 150'   Walk 10 feet activity   Assist     Assist level: Contact Guard/Touching assist Assistive device: Rollator   Walk 50 feet activity   Assist Walk 50 feet with 2 turns activity did not occur: Safety/medical concerns (fatigue, weakness, decreased balance)  Assist level: Contact Guard/Touching assist Assistive device: Rollator    Walk 150 feet activity   Assist Walk 150 feet activity did not occur: Safety/medical concerns (fatigue, weakness, decreased balance)  Assist level: Contact Guard/Touching assist Assistive device: Rollator    Walk 10 feet on uneven surface  activity   Assist Walk 10 feet on uneven surfaces activity did not occur: Safety/medical  concerns (fatigue, weakness, decreased balance)         Wheelchair     Assist Will patient use wheelchair at discharge?: No             Wheelchair 50 feet with 2 turns activity    Assist            Wheelchair 150 feet activity     Assist          Blood pressure (!) 141/87, pulse 100, temperature 98 F (36.7 C), temperature source Oral, resp. rate 18, SpO2 98 %.    Medical Problem List and Plan: 1.Debilitysecondary to infected thigh wound hematoma. Status post I&D 02/06/2021 with a wound VAC and drain placement -patient maynot yetshower -ELOS/Goals: 7-10 days, supervision to mod I goals with PT and OT- upgrade from 15/7 to regular.   -Continue CIR 2. Antithrombotics: -DVT/anticoagulation:SCDs. Chronic anticoagulation currently on hold -antiplatelet therapy: N/A 3. Post-operative paint:Hydrocodone as needed- continue 4. Mood:Provide emotional support -antipsychotic agents: N/A 5. Neuropsych: This patientiscapable of making decisions on herown behalf. 6. Skin/Wound Care:Routine skin checks. Requested Dr. Pollie Friar team to evaluate wound  7. Fluids/Electrolytes/Nutrition:Routine in and outs with follow-up chemistries 8. Acute blood loss anemia. Discussed Hgb 8.6, repeat Monday.  9. Atrial fibrillation/pacemaker. Continue Amiodarone 200 mg daily. Cardiac rate is some whatcontrolled. Eliquis on hold due to hematoma -pt is going in and out of a-fib on exam today. Activity tolerance is poor when HR is elevated and irregular.  -f/u with cardiology, will consult for f/u - mild tachy, reviewed 5/25 EKG 10. Orthostasis. Florinef 0.05 mg twice daily, ProAmatine 5 mg twice daily. Monitor with increased mobility 11. ID. E. Coli/E Faecalis. Currently remains on Unasyn. Will need to define duration of antibiotic. -ESR, CRP to monitor status of  infection- discussed results and ordered repeat on Monday 12. Hypothyroidism. Synthroid 13. NHL with SVC syndrome/lung cancer with radiation therapy as well as chemo. Follow-up Dr. Lindi Adie 14. Obesity. Status post lap band surgery. BMI 33.64. Dietary follow-up 15. Hyperlipidemia. Crestor  16.  Vascular access- PICC placed for IV abx 17. Constipation: start senna 2 tabs HS  LOS: 3 days A FACE TO FACE EVALUATION WAS PERFORMED  Clide Deutscher Nicolaus Andel 02/16/2021, 9:17 AM

## 2021-02-16 NOTE — Progress Notes (Signed)
Occupational Therapy Session Note  Patient Details  Name: Kayla Harrison MRN: [id number] Date of Birth: [birth year]  Today's Date: 02/16/2021 OT Individual Time: 2248-2500 and 3704-8889 OT Individual Time Calculation (min): 25 min and 30 min   Short Term Goals: Week 1:  OT Short Term Goal 1 (Week 1): STG = LTG due to ELOS  Skilled Therapeutic Interventions/Progress Updates:    1) Treatment session with focus on activity tolerance and LB dressing.  Pt received upright in recliner with pants halfway on and pt expressing desire to don shirt.  Pt still hooked up to PICC line therefore unable to don shirt at this time.  Pt completed sit > stand with close supervision with pt able to pull pants over hips with CGA when standing.  Pt reports feeling like she is "going to fall over", therapist encouraged pt to sit when feeling unsteady.  Pt able to return to standing to finish adjusting pants over hips.  Pt requested to ambulate during session. Pt ambulated 59' x2 with rollator with CGA and therapist managing IV pole.  Pt required prolonged seated rest break between each bout then requested to return to room as she felt "exhausted".  Pt returned to sitting upright in recliner and left with legs elevated and all needs in reach.  2) Treatment session with focus on functional mobility and activity tolerance.  Pt received upright in recliner finishing lunch.  Pt reports MD should be arriving soon to change dressings, therefore pt wants to be in bed at end of session.  Pt requested to ambulate prior to transferring to bed.  Pt ambulated 31' x2 with rollator with supervision and therapist managing IV pole.  Therapist educating pt on energy conservation strategies and endurance during ambulation.  Pt returned to room and required assistance to advance RLE back in to bed.  Pt left semi-reclined in bed with all needs in reach.  Therapy Documentation Precautions:  Precautions Precautions: Fall Precaution  Comments: has hx of orthostatic hypotension Restrictions Weight Bearing Restrictions: No LUE Weight Bearing: Weight bearing as tolerated LLE Weight Bearing: Weight bearing as tolerated Pain:  Pt with no c/o pain   Therapy/Group: Individual Therapy  Simonne Come 02/16/2021, 11:32 AM

## 2021-02-16 NOTE — IPOC Note (Signed)
Overall Plan of Care [facility]) Patient Details Name: CAROLYNNE SCHUCHARD MRN: [id number] DOB: May 05, 1946  Admitting Diagnosis: [facility] Problems: Principal Problem:   Debility Active Problems:   Polymicrobial bacterial infection   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   Enterococcus faecalis infection   Sepsis due to Escherichia coli [facility])     Functional Problem List: Nursing Safety,Medication Management,Bladder,Bowel,Pain,Endurance,Skin Integrity  PT Balance,Edema,Endurance,Motor,Pain,Sensory,Skin Integrity  OT Edema,Endurance,Motor,Pain,Safety,Sensory,Balance  SLP    TR         Basic ADL's: OT Grooming,Bathing,Dressing,Toileting     Advanced  ADL's: OT Light Housekeeping,Laundry,Simple Meal Preparation     Transfers: PT Bed Mobility,Bed to [employer]  OT Toilet     Locomotion: PT Ambulation,Wheelchair Mobility,Stairs     Additional Impairments: OT None  SLP        TR      Anticipated Outcomes Item Anticipated Outcome  Self Feeding NA  Swallowing      Basic self-care  Modified Independent  Toileting  Modified Independent   Bathroom Transfers Modified Independent  Bowel/Bladder  Manage bowel with mod I assist and bladder independently  Transfers  Mod I  Locomotion  Mod I  Communication     Cognition     Pain  pain at or below level 4  Safety/Judgment  Maintain safety with cues/rminders   Therapy Plan: PT Intensity: Minimum of 1-2 x/day ,45 to 90 minutes PT Frequency: 5 out of 7 days PT Duration Estimated Length of Stay: 7-10 days OT Intensity: Minimum of 1-2 x/day, 45 to 90 minutes OT Frequency: Total of 15 hours over 7 days of combined therapies OT Duration/Estimated Length of Stay: 7-10 days     Due to the current state of emergency, patients may not be receiving their 3-hours of Medicare-mandated therapy.   Team Interventions: Nursing Interventions Patient/Family Education,Bowel Management,Disease  Management/Prevention,Bladder Management,Medication Management,Discharge Planning,Skin Care/Wound Management,Pain Management  PT interventions Ambulation/gait training,Community Corporate treasurer re-education,Psychosocial support,Stair training,UE/LE Strength taining/ROM,Wheelchair propulsion/positioning,UE/LE Coordination activities,Therapeutic Activities,Skin care/wound Medical laboratory scientific officer stimulation,Pain management,Discharge planning,Balance/vestibular training,Disease management/prevention,Functional mobility training,Patient/family education,Splinting/orthotics,Therapeutic Exercise  OT Interventions Balance/vestibular training,Discharge planning,DME/adaptive equipment instruction,Therapeutic Exercise,UE/LE Strength taining/ROM,Therapeutic Activities,Skin care/wound managment,Self Care/advanced ADL retraining,Pain management,Patient/family education,Psychosocial support,Functional mobility training,UE/LE Coordination activities,Community reintegration  SLP Interventions    TR Interventions    SW/CM Interventions Discharge Planning,Psychosocial Support,Patient/Family Education,Disease Management/Prevention   Barriers to Discharge MD  Medical stability  Nursing Decreased caregiver support,Wound Care,Lack of/limited family support,Other (comments) 1 level  home alone, with wound vac and skin care  PT Decreased caregiver support,Lack of/limited family support,Weight,Other (comments),Wound Care hx of orthostatic symptoms, lymphedema, lives alone  OT Decreased caregiver support,Weight,Lack of/limited family support,Wound Care    SLP      SW Wound Care,Lack of/limited family support,Decreased caregiver support     Team Discharge Planning: Destination: PT-Home ,OT- Home , SLP-  Projected Follow-up: PT-Home health PT, OT-  Home health OT, SLP-  Projected Equipment Needs: PT-To be determined, OT- To be determined, SLP-  Equipment Details:  PT-has rollator, OT-  Patient/family involved in discharge planning: PT- Patient,  OT-Patient, SLP-   MD ELOS: 7-10 days  Medical Rehab Prognosis:  Excellent Assessment: Mrs. Heumann is a 75 year old woman who is admitted to CIR with debility secondary to left thigh hematoma. Her current medical complications include left thigh hematoma, post-operative pain, constipation, atrial fibrillation. Medications have been adjusted and vitals and labs are being monitored.   See Team Conference Notes for weekly updates to the plan of care

## 2021-02-17 DIAGNOSIS — I4819 Other persistent atrial fibrillation: Secondary | ICD-10-CM

## 2021-02-17 DIAGNOSIS — T8131XS Disruption of external operation (surgical) wound, not elsewhere classified, sequela: Secondary | ICD-10-CM

## 2021-02-17 DIAGNOSIS — K5901 Slow transit constipation: Secondary | ICD-10-CM

## 2021-02-17 LAB — MISC LABCORP TEST (SEND OUT): Labcorp test code: 96388

## 2021-02-17 NOTE — Progress Notes (Signed)
Physical Therapy Session Note  Patient Details  Name: Kayla Harrison MRN: [id number] Date of Birth: [birth year]  Today's Date: 02/17/2021 PT Individual Time: 1000-1041 and 1300-1341 PT Individual Time Calculation (min): 41 min and 41 min  Short Term Goals: Week 1:  PT Short Term Goal 1 (Week 1): STG=LTG due to LOS  Skilled Therapeutic Interventions/Progress Updates:   Treatment Session 1 Received pt sitting in recliner, pt agreeable to therapy, and reported soreness in L lateral hip from having drains removed but otherwise denied any pain. Session with emphasis on functional mobility/transfers, generalized strengthening, dynamic standing balance/coordination, gait training, and improved activity tolerance. Pt ambulated 161ft x 1 and 148ft x 1 to 4W therapy gym with rollator and supervision with total A to manage IV pole. Pt performed the following exercises with emphasis on LE strengthening and balance with supervision: -standing mini squats 3x10 with BUE support -standing alternating marches 3x10 bilaterally with BUE support -seated LAQ x12 bilaterally -seated hip flexion 2x12 bilaterally Worked on blocked practice sit<>stands without UE support 1x4 and 1x1 with close supervision and cues for anterior weight shifting. Pt fearful of falling forwards and reported difficulty feeling weak and "not trusting her legs". Pt ambulated additional 172ft back towards room but requested to sit due to reports of "lightheadedness". Transported pt on rollator dependently onto to elevator but once symptoms resolved pt ambulated additional 73ft with rollator and supervision to room. Concluded session with pt sitting in recliner with all needs within reach.   Treatment Session 2 Received pt sitting in recliner, pt agreeable to therapy, and continues to report soreness in L lateral hip but no pain otherwise. Session with emphasis on functional mobility/transfers, generalized strengthening, dynamic standing  balance/coordination, gait training, and improved activity tolerance. Pt ambulated 160ft x 1, 114ft, and 74ft x 1 with rollator and supervision to 4W dayroom with total A to manage IV pole. Pt required multiple seated rest breaks due to fatigue but denied any lightheadedness throughout session. Pt ambulated 58ft to Kinetron and performed seated BLE strengthening on Kinetron at 20 cm/sec for 1 minute x 4 trials with bilateral UE support with emphasis on glute/quad strengthening and core control. Pt then ambulated additional 78ft x 1, 162ft x 1, and 3ft x 2 trials with rollator and supervision back to room and requested to get in bed due to fatigue and LE weakness. Sit<>supine with supervision. Concluded session with pt supine in bed, needs within reach, and bed alarm on.   Therapy Documentation Precautions:  Precautions Precautions: Fall Precaution Comments: has hx of orthostatic hypotension Restrictions Weight Bearing Restrictions: No LUE Weight Bearing: Weight bearing as tolerated LLE Weight Bearing: Weight bearing as tolerated  Therapy/Group: Individual Therapy Alfonse Alpers PT, DPT   02/17/2021, 7:19 AM

## 2021-02-17 NOTE — Progress Notes (Signed)
Occupational Therapy Session Note  Patient Details  Name: Kayla Harrison MRN: [id number] Date of Birth: 1946-06-14  Today's Date: 02/17/2021 OT Individual Time: 0710-0840 OT Individual Time Calculation (min): 90 min    Short Term Goals: Week 1:  OT Short Term Goal 1 (Week 1): STG = LTG due to ELOS  Skilled Therapeutic Interventions/Progress Updates:    Session 1: Pt received in recliner, agreeable to earlier than scheduled therapy session. Denies pain. Pt doffed/donned hospital gown light min A to button up sleeves/tie in back. Donned underwear/pants with light min A to pull up over L hip / manage wound vac. Donned B shoes close S + increased time. Pt completed full-body bathing with wash cloth/ CHB wipes with CGA for STS with rollator. Sudden urge for bowel movement, amb to bathroom to complete toilet transfer with rollator with CGA + A to manage IV pole. CGA for LB clothing management. Small episode of incontinence in underwear 2/2 urgency. Further continent void. Completed seated pericare close S and able to doff/don new pants with CGA. Amb transfer to sink where she completed seated grooming with distant S. Pt states prior to DC home, she is most concerned about her B knees buckling and that she feels "wobbly" standing up. Donned B shoes with min A to adjust straps and hold shoe in place. Amb to and from 5N tower with CGA, req 4 seated rest breaks after every ~ 40 ft. Pt reports mod RPE at 8/10, educated on self-monitoring for fatigue and initiating seated rest breaks when she is at a 7 fatigue level. Overall, she demonstrates good initiation of seated rest breaks and rollator management, although req min VCs to sit down when having reported feeling "wobbly" prior to exiting bathroom. Amb transfer back to recliner CGA. Pt additionally completed 1x10 of seated marches and SQAs for BLE strengthening.  Pt left in recliner with call bell in reach, and all immediate needs met.    Therapy  Documentation Precautions:  Precautions Precautions: Fall Precaution Comments: has hx of orthostatic hypotension Restrictions Weight Bearing Restrictions: No LUE Weight Bearing: Weight bearing as tolerated LLE Weight Bearing: Weight bearing as tolerated Pain: denies   ADL: See Care Tool for more details.   Therapy/Group: Individual Therapy  Volanda Napoleon MS, OTR/L  02/17/2021, 6:59 AM

## 2021-02-17 NOTE — Progress Notes (Signed)
PROGRESS NOTE   Subjective/Complaints: Had a reasonable night. Still fatigues with therapy and feels that her lower legs give out more than they should. Walked over 500' with PT yesterday  ROS: Patient denies fever, rash, sore throat, blurred vision, nausea, vomiting, diarrhea, cough, shortness of breath or chest pain, joint or back pain, headache, or mood change.    Objective:   No results found. No results for input(s): WBC, HGB, HCT, PLT in the last 72 hours. No results for input(s): NA, K, CL, CO2, GLUCOSE, BUN, CREATININE, CALCIUM in the last 72 hours.  Intake/Output Summary (Last 24 hours) at 02/17/2021 1308 Last data filed at 02/17/2021 0900 Gross per 24 hour  Intake 480 ml  Output 400 ml  Net 80 ml        Physical Exam: Vital Signs Blood pressure 131/90, pulse (!) 109, temperature 98 F (36.7 C), temperature source Oral, resp. rate 18, SpO2 98 %. Constitutional: No distress . Vital signs reviewed. HEENT: EOMI, oral membranes moist Neck: supple Cardiovascular: tachy   Respiratory/Chest:   Normal effort    GI/Abdomen:   non-distended Ext: no clubbing, cyanosis, tr to 1+RLE, 1+LLE edema Psych: pleasant and cooperative  Skin: JP sites clean with s/s drainage. Remainder of incision with vac. Neurologic: Cranial nerves II through XII intact, motor strength is 5/5 in bilateral deltoid, bicep, tricep, grip, hip flexor, 4- RIght and 3- left knee extensors, ankle dorsiflexor and plantar flexor Sensory exam normal sensation to light touch  in bilateral upper and lower extremities  Musculoskeletal: left thigh with swelling      Assessment/Plan: 1. Functional deficits which require 3+ hours per day of interdisciplinary therapy in a comprehensive inpatient rehab setting.  Physiatrist is providing close team supervision and 24 hour management of active medical problems listed below.  Physiatrist and rehab team continue  to assess barriers to discharge/monitor patient progress toward functional and medical goals  Care Tool:  Bathing    Body parts bathed by patient: Right arm,Chest,Left arm,Abdomen,Front perineal area,Right upper leg,Left upper leg,Right lower leg,Left lower leg,Face,Buttocks         Bathing assist Assist Level: Contact Guard/Touching assist     Upper Body Dressing/Undressing Upper body dressing   What is the patient wearing?: Hospital gown only    Upper body assist Assist Level: Minimal Assistance - Patient > 75%    Lower Body Dressing/Undressing Lower body dressing      What is the patient wearing?: Pants,Underwear/pull up     Lower body assist Assist for lower body dressing: Minimal Assistance - Patient > 75%     Toileting Toileting    Toileting assist Assist for toileting: Contact Guard/Touching assist     Transfers Chair/bed transfer  Transfers assist     Chair/bed transfer assist level: Supervision/Verbal cueing Chair/bed transfer assistive device: Other (rollator)   Locomotion Ambulation   Ambulation assist      Assist level: Supervision/Verbal cueing Assistive device: Rollator Max distance: 182ft   Walk 10 feet activity   Assist     Assist level: Supervision/Verbal cueing Assistive device: Rollator   Walk 50 feet activity   Assist Walk 50 feet with 2 turns activity did not  occur: Safety/medical concerns (fatigue, weakness, decreased balance)  Assist level: Supervision/Verbal cueing Assistive device: Rollator    Walk 150 feet activity   Assist Walk 150 feet activity did not occur: Safety/medical concerns (fatigue, weakness, decreased balance)  Assist level: Contact Guard/Touching assist Assistive device: Rollator    Walk 10 feet on uneven surface  activity   Assist Walk 10 feet on uneven surfaces activity did not occur: Safety/medical concerns (fatigue, weakness, decreased balance)         Wheelchair     Assist  Will patient use wheelchair at discharge?: No             Wheelchair 50 feet with 2 turns activity    Assist            Wheelchair 150 feet activity     Assist          Blood pressure 131/90, pulse (!) 109, temperature 98 F (36.7 C), temperature source Oral, resp. rate 18, SpO2 98 %.    Medical Problem List and Plan: 1.Debilitysecondary to infected thigh wound hematoma. Status post I&D 02/06/2021 with a wound VAC and drain placement -patient maynot yetshower -ELOS/Goals: 7-10 days, supervision to mod I goals with PT and OT- upgrade from 15/7 to regular.   -Continue CIR therapies including PT, OT  2. Antithrombotics: -DVT/anticoagulation:SCDs. Chronic anticoagulation currently on hold -antiplatelet therapy: N/A 3. Post-operative paint:Hydrocodone as needed- continue 4. Mood:Provide emotional support -antipsychotic agents: N/A 5. Neuropsych: This patientiscapable of making decisions on herown behalf. 6. Skin/Wound Care:continue vac, JP drains out  -edema control with elevation. Pt doesn't want TEDS or ACE wraps 7. Fluids/Electrolytes/Nutrition:Routine in and outs with follow-up chemistries 8. Acute blood loss anemia. Discussed Hgb 8.6, repeat Monday.  9. Atrial fibrillation/pacemaker. Continue Amiodarone 200 mg daily. Cardiac rate is some whatcontrolled. Eliquis on hold due to hematoma -pt continues to go in and out of atrial fib  -f/u with cardiology per primary rehab team  10. Orthostasis. Florinef 0.05 mg twice daily, ProAmatine 5 mg twice daily. Monitor with increased mobility 11. ID. E. Coli/E Faecalis. Currently remains on Unasyn. Will need to define duration of antibiotic. -ESR, CRP to monitor status of infection-  ordered repeat labs on Monday 12. Hypothyroidism. Synthroid 13. NHL with SVC syndrome/lung cancer with radiation  therapy as well as chemo. Follow-up Dr. Lindi Adie 14. Obesity. Status post lap band surgery. BMI 33.64. Dietary follow-up 15. Hyperlipidemia. Crestor  16.  Vascular access- PICC placed for IV abx 17. Constipation: start senna 2 tabs HS  -had bm this morning LOS: 4 days A FACE TO FACE EVALUATION WAS PERFORMED  Meredith Staggers 02/17/2021, 1:08 PM

## 2021-02-18 NOTE — Progress Notes (Signed)
Assisted Surgeon with wound vac change  this afternoon. Blisters to left inner thigh were opening and draining, site appears pale and yellow. Per Dr's instructions, non-stick gauze and cloth tape placed to cover blisters after leaving open to air for a couple hours. Kayla Harrison

## 2021-02-19 LAB — BASIC METABOLIC PANEL
Anion gap: 6 (ref 5–15)
BUN: 13 mg/dL (ref 8–23)
CO2: 28 mmol/L (ref 22–32)
Calcium: 8.1 mg/dL — ABNORMAL LOW (ref 8.9–10.3)
Chloride: 103 mmol/L (ref 98–111)
Creatinine, Ser: 1.07 mg/dL — ABNORMAL HIGH (ref 0.44–1.00)
GFR, Estimated: 54 mL/min — ABNORMAL LOW (ref >60)
Glucose, Bld: 108 mg/dL — ABNORMAL HIGH (ref 70–99)
Potassium: 3.4 mmol/L — ABNORMAL LOW (ref 3.5–5.1)
Sodium: 137 mmol/L (ref 135–145)

## 2021-02-19 LAB — CBC WITH DIFFERENTIAL/PLATELET
Abs Immature Granulocytes: 0.02 10*3/uL (ref 0.00–0.07)
Basophils Absolute: 0.1 10*3/uL (ref 0.0–0.1)
Basophils Relative: 1 %
Eosinophils Absolute: 0.2 10*3/uL (ref 0.0–0.5)
Eosinophils Relative: 2 %
HCT: 28.3 % — ABNORMAL LOW (ref 36.0–46.0)
Hemoglobin: 8.6 g/dL — ABNORMAL LOW (ref 12.0–15.0)
Immature Granulocytes: 0 %
Lymphocytes Relative: 10 %
Lymphs Abs: 0.7 10*3/uL (ref 0.7–4.0)
MCH: 29.8 pg (ref 26.0–34.0)
MCHC: 30.4 g/dL (ref 30.0–36.0)
MCV: 97.9 fL (ref 80.0–100.0)
Monocytes Absolute: 0.8 10*3/uL (ref 0.1–1.0)
Monocytes Relative: 11 %
Neutro Abs: 5.7 10*3/uL (ref 1.7–7.7)
Neutrophils Relative %: 76 %
Platelets: 270 10*3/uL (ref 150–400)
RBC: 2.89 MIL/uL — ABNORMAL LOW (ref 3.87–5.11)
RDW: 16.9 % — ABNORMAL HIGH (ref 11.5–15.5)
WBC: 7.5 10*3/uL (ref 4.0–10.5)
nRBC: 0 % (ref 0.0–0.2)

## 2021-02-19 LAB — SEDIMENTATION RATE: Sed Rate: 36 mm/hr — ABNORMAL HIGH (ref 0–22)

## 2021-02-19 LAB — AEROBIC/ANAEROBIC CULTURE W GRAM STAIN (SURGICAL/DEEP WOUND)

## 2021-02-19 LAB — C-REACTIVE PROTEIN: CRP: 0.8 mg/dL (ref <1.0)

## 2021-02-19 MED ORDER — MIDODRINE HCL 5 MG PO TABS
5.0000 mg | ORAL_TABLET | Freq: Two times a day (BID) | ORAL | Status: DC
  Administered 2021-02-20: 5 mg via ORAL
  Filled 2021-02-19: qty 1

## 2021-02-19 NOTE — Progress Notes (Signed)
Physical Therapy Session Note  Patient Details  Name: Kayla Harrison MRN: [id number] Date of Birth: 04/10/1946  Today's Date: 02/19/2021 PT Individual Time: 6226-3335 and 4562-5638  PT Individual Time Calculation (min): 54 min and 26 min  Short Term Goals: Week 1:  PT Short Term Goal 1 (Week 1): STG=LTG due to LOS  Skilled Therapeutic Interventions/Progress Updates:   Treatment Session 1 Received pt sitting in recliner finishing lunch, pt agreeable to therapy, and denied any pain during session. PA, Pam, present during session to inspect wound vac and incision. Session with emphasis on functional mobility/transfers, generalized strengthening, dynamic standing balance/coordination, gait training, stair navigation, simulated car transfers, and improved activity tolerance. Pt ambulated 144ft x 1 and 11ft x 2 trials using rollator and supervision to 4W therapy gym with total A to manage IV pole with 1 seated rest break and 1 standing rest break on elevator. Pt navigated 4 steps with 2 rails and CGA ascending and descending with a step to pattern with no LOB or reports of weakness. Pt ambulated additional 19ft with rollator and supervision to ortho gym and performed ambulatory simulated car transfer with rollator and min A for RLE management due to body habitus and ambulated 6ft on uneven surfaces (ramp) with rollator and supervision with total A to manage IV. Ambulated 22ft to Nustep and performed BUE/LE strengthening on Nustep at workload 5 for 8 minutes for a total 455 steps for improved cardiovascular endurance with mod A to place feet on footplates. Pt ambulated 59ft x 1 and 164ft x 1 using rollator and supervision back to room with 1 seated rest break on elevator. Concluded session with pt sitting in recliner with all needs within reach.   Treatment Session 2 Received pt sitting in recliner, pt agreeable to therapy, and denied any pain during session. Session with emphasis on functional  mobility/transfers, generalized strengthening, dynamic standing balance/coordination, gait training, and improved activity tolerance. Pt ambulated 118ft x 1 and 59ft x 1 using rollator and supervision to [facility] with 1 seated rest break. Pt then ambulated 165ft with rollator and supervision all the way back to room with total A to manage IV pole. Pt requested to return to bed and transferred sit<>supine with supervision. Pt performed heel slides x22 bilaterally and SLR 2x8 bilaterally. Concluded session with pt supine in bed, needs within reach, and bed alarm on.   Therapy Documentation Precautions:  Precautions Precautions: Fall Precaution Comments: has hx of orthostatic hypotension Restrictions Weight Bearing Restrictions: No LUE Weight Bearing: Weight bearing as tolerated LLE Weight Bearing: Weight bearing as tolerated  Therapy/Group: Individual Therapy Alfonse Alpers PT, DPT   02/19/2021, 7:21 AM

## 2021-02-19 NOTE — Progress Notes (Signed)
Occupational Therapy Session Note  Patient Details  Name: Kayla Harrison MRN: [id number] Date of Birth: 03/10/1946  Today's Date: 02/19/2021 OT Individual Time: [id number] and 1003-1031 OT Individual Time Calculation (min): 57 min and 28 min   Short Term Goals: Week 1:  OT Short Term Goal 1 (Week 1): STG = LTG due to ELOS  Skilled Therapeutic Interventions/Progress Updates:    1) Treatment session with focus on functional mobility, activity tolerance/endurance, and d/c planning.  Pt received upright in recliner reporting "all ready for session".  Pt reports already dressed and completed toileting and grooming tasks prior to session. Pt requested to focus on ambulation, requesting to go outside.  Pt ambulated >100' with rollator and on/off elevator prior to requiring seated rest break.  Pt ambulated outside over uneven surfaces while engaging in discussion of energy conservation strategies in regards to environmental limitations (ie heat of summer and going out in the mornings).  Pt reports understanding.  Engaged in discussion about d/c concerns, pt reports plan to purchase adjustable bed upon d/c. Pt ambulated 100' x2 with Rollator with supervision approaching Mod I, as pt able to recognize when needing to sit and rest.  Pt returned to room via w/c for energy conservation.  Pt returned to sitting upright in recliner and left with all needs in reach.  2) Treatment session with focus on functional mobility, activity tolerance/endurance, and d/c planning. Therapist offered pt to engage in home making tasks in ADL apt, pt declined requesting to focus on ambulation.  Pt ambulated 200' x4 with seated rest breaks on Rollator.  During last bout pt took 2-3 standing rest breaks due to fatigue.  Engaged in discussion regarding meal prep and laundry tasks with focus on energy conservation.  Pt reports making simple meals, mostly salads and pre-cooked chicken.  Therapist educated on taking seated rest breaks  throughout and spacing tasks out throughout day for improved endurance and energy conservation.  Pt returned to room and remained upright in recliner with all needs in reach.  Therapy Documentation Precautions:  Precautions Precautions: Fall Precaution Comments: has hx of orthostatic hypotension Restrictions Weight Bearing Restrictions: No LUE Weight Bearing: Weight bearing as tolerated LLE Weight Bearing: Weight bearing as tolerated Pain:  Pt with no c/o pain   Therapy/Group: Individual Therapy  Simonne Come 02/19/2021, 10:49 AM

## 2021-02-19 NOTE — Progress Notes (Signed)
Labs not drawn this am. Placed order to be drawn from PICC line to IV team.

## 2021-02-19 NOTE — Progress Notes (Signed)
B/p elevated; called PA and received new orders.

## 2021-02-19 NOTE — Progress Notes (Signed)
PROGRESS NOTE   Subjective/Complaints:   Pt unable to change her own dressing , she is concerned about going home, thinks home health RN can only come out once a week and is concerned about frequency of dressing changes  ROS: Patient denies CP, SOB, N/V/D    Objective:   No results found. No results for input(s): WBC, HGB, HCT, PLT in the last 72 hours. No results for input(s): NA, K, CL, CO2, GLUCOSE, BUN, CREATININE, CALCIUM in the last 72 hours.  Intake/Output Summary (Last 24 hours) at 02/19/2021 0924 Last data filed at 02/19/2021 0816 Gross per 24 hour  Intake 720 ml  Output 400 ml  Net 320 ml        Physical Exam: Vital Signs Blood pressure (!) 145/100, pulse (!) 107, temperature 97.7 F (36.5 C), temperature source Oral, resp. rate 20, SpO2 100 %.  General: No acute distress Mood and affect are appropriate Heart: Regular rate and rhythm no rubs murmurs or extra sounds Lungs: Clear to auscultation, breathing unlabored, no rales or wheezes Abdomen: Positive bowel sounds, soft nontender to palpation, nondistended Extremities: No clubbing, cyanosis, or edema Skin: No evidence of breakdown, no evidence of rash    Skin: left thigh wound  with vac. Neurologic: Cranial nerves II through XII intact, motor strength is 5/5 in bilateral deltoid, bicep, tricep, grip, hip flexor, 4- RIght and 3- left knee extensors, ankle dorsiflexor and plantar flexor Sensory exam normal sensation to light touch  in bilateral upper and lower extremities  Musculoskeletal: left thigh with swelling      Assessment/Plan: 1. Functional deficits which require 3+ hours per day of interdisciplinary therapy in a comprehensive inpatient rehab setting.  Physiatrist is providing close team supervision and 24 hour management of active medical problems listed below.  Physiatrist and rehab team continue to assess barriers to discharge/monitor  patient progress toward functional and medical goals  Care Tool:  Bathing    Body parts bathed by patient: Right arm,Chest,Left arm,Abdomen,Front perineal area,Right upper leg,Left upper leg,Right lower leg,Left lower leg,Face,Buttocks         Bathing assist Assist Level: Contact Guard/Touching assist     Upper Body Dressing/Undressing Upper body dressing   What is the patient wearing?: Hospital gown only    Upper body assist Assist Level: Minimal Assistance - Patient > 75%    Lower Body Dressing/Undressing Lower body dressing      What is the patient wearing?: Pants,Underwear/pull up     Lower body assist Assist for lower body dressing: Minimal Assistance - Patient > 75%     Toileting Toileting    Toileting assist Assist for toileting: Contact Guard/Touching assist     Transfers Chair/bed transfer  Transfers assist     Chair/bed transfer assist level: Supervision/Verbal cueing Chair/bed transfer assistive device: Other (rollator)   Locomotion Ambulation   Ambulation assist      Assist level: Supervision/Verbal cueing Assistive device: Rollator Max distance: 159f   Walk 10 feet activity   Assist     Assist level: Supervision/Verbal cueing Assistive device: Rollator   Walk 50 feet activity   Assist Walk 50 feet with 2 turns activity did not occur: Safety/medical  concerns (fatigue, weakness, decreased balance)  Assist level: Supervision/Verbal cueing Assistive device: Rollator    Walk 150 feet activity   Assist Walk 150 feet activity did not occur: Safety/medical concerns (fatigue, weakness, decreased balance)  Assist level: Contact Guard/Touching assist Assistive device: Rollator    Walk 10 feet on uneven surface  activity   Assist Walk 10 feet on uneven surfaces activity did not occur: Safety/medical concerns (fatigue, weakness, decreased balance)         Wheelchair     Assist Will patient use wheelchair at  discharge?: No             Wheelchair 50 feet with 2 turns activity    Assist            Wheelchair 150 feet activity     Assist          Blood pressure (!) 145/100, pulse (!) 107, temperature 97.7 F (36.5 C), temperature source Oral, resp. rate 20, SpO2 100 %.    Medical Problem List and Plan: 1.Debilitysecondary to infected thigh wound hematoma. Status post I&D 02/06/2021 with a wound VAC and drain placement -patient maynot yetshower -ELOS/Goals: 7-10 days, supervision to mod I goals with PT and OT- upgrade from 15/7 to regular.   -Continue CIR therapies including PT, OT  2. Antithrombotics: -DVT/anticoagulation:SCDs. Chronic anticoagulation currently on hold -antiplatelet therapy: N/A 3. Post-operative paint:Hydrocodone as needed- continue 4. Mood:Provide emotional support -antipsychotic agents: N/A 5. Neuropsych: This patientiscapable of making decisions on herown behalf. 6. Skin/Wound Care:continue vac, JP drains out- will need to have plan for dressing changes post d/c pt anxious about this  -edema control with elevation. Pt doesn't want TEDS or ACE wraps 7. Fluids/Electrolytes/Nutrition:Routine in and outs with follow-up chemistries 8. Acute blood loss anemia. Discussed Hgb 8.6, repeat Monday.  9. Atrial fibrillation/pacemaker. Continue Amiodarone 200 mg daily. Cardiac rate is some whatcontrolled. Eliquis on hold due to hematoma -pt continues to go in and out of atrial fib  -f/u with cardiology per primary rehab team  10. Orthostasis. Florinef 0.05 mg twice daily, ProAmatine 5 mg twice daily. Monitor with increased mobility 11. ID. E. Coli/E Faecalis. Currently remains on Unasyn. Will need to define duration of antibiotic. -ESR, CRP to monitor status of infection-  ordered repeat labs on Monday 12. Hypothyroidism.  Synthroid 13. NHL with SVC syndrome/lung cancer with radiation therapy as well as chemo. Follow-up Dr. Lindi Adie 14. Obesity. Status post lap band surgery. BMI 33.64. Dietary follow-up 15. Hyperlipidemia. Crestor  16.  Vascular access- PICC placed for IV abx 17. Constipation: start senna 2 tabs HS  -had bm this morning LOS: 6 days A FACE TO [place] E Thanvi Blincoe 02/19/2021, 9:24 AM

## 2021-02-20 MED ORDER — MIDODRINE HCL 5 MG PO TABS
5.0000 mg | ORAL_TABLET | Freq: Every day | ORAL | Status: DC
  Filled 2021-02-20: qty 1

## 2021-02-20 NOTE — Progress Notes (Signed)
Occupational Therapy Session Note  Patient Details  Name: Kayla Harrison MRN: [id number] Date of Birth: 1946-08-26  Today's Date: 02/20/2021 OT Individual Time: 9379-0240 and 1130-1153 and 1336-1401 OT Individual Time Calculation (min): 57 min and 23 min and 25 min   Short Term Goals: Week 1:  OT Short Term Goal 1 (Week 1): STG = LTG due to ELOS  Skilled Therapeutic Interventions/Progress Updates:    1) Treatment session with focus on self-care retraining and functional mobility and endurance.  Pt received upright in recliner reporting fatigue but agreeable to therapy session.  Pt asking questions about improved technique for donning shoes.  Pt wears sandals with velcro straps.  Discussed typical recommendation for supportive shoes that can be secured, as pt also reports wearing slip on sandals.  Pt demonstrating difficulty crossing legs into figure 4 and also when reaching towards floor.  Utilized make shift stool to better allow pt to reach feet.  Attempted use of reacher and shoe horn to problem solve donning shoes, pt able to complete without use of AE with increased time.  Pt requesting to ambulate outside.  Pt ambulated on/off elevator and over uneven surfaces outside while therapist managed IV pole.  Engaged in discussion regarding energy conservation.  Pt able to self-select rest breaks and demonstrates safety and awareness with positioning rollator.  Pt returned to room and left upright in recliner with legs elevated and all needs in reach.  2) Treatment session with focus on functional mobility and self-care retraining. Pt received upright in recliner reporting urgent need to toilet.  Pt ambulated to toilet with rollator with distant supervision and completed toileting needs Mod I.  Pt returned to recliner and donned shirt with setup, as IV disconnected.  Pt ambulated 200' and 100'x2 with rollator with distant supervision with seated rest breaks between each bout.  Pt reports fatigue and  requested to return to room.  Pt returned to recliner and left upright with legs elevated and all needs in reach.  3) Treatment session with focus on functional mobility and activity tolerance.  Pt received upright in recliner expressing desire to walk during session.  Therapist educated pt on goals, encouraging pt to engage in functional mobility in ADL apt to simulate home environment and home making tasks.  Pt stated "I've already done that".  Pt ambulated 200' with rollator requiring one seated rest break part of the way through.  Pt reports increased fatigue with decreased endurance, therefore requiring increased rest breaks.  Pt returned to room and returned to recliner.  Pt unable to lift leg rest, therefore required assistance to manage leg rest.  Therapy Documentation Precautions:  Precautions Precautions: Fall Precaution Comments: has hx of orthostatic hypotension Restrictions Weight Bearing Restrictions: No LUE Weight Bearing: Weight bearing as tolerated LLE Weight Bearing: Weight bearing as tolerated Pain:     Therapy/Group: Individual Therapy  Simonne Come 02/20/2021, 11:05 AM

## 2021-02-20 NOTE — Progress Notes (Signed)
Physical Therapy Session Note  Patient Details  Name: Kayla Harrison MRN: [id number] Date of Birth: 1946/05/20  Today's Date: 02/20/2021 PT Individual Time: 1694-5038 and 1445-1525 PT Individual Time Calculation (min): 40 min and 40 min  Short Term Goals: Week 1:  PT Short Term Goal 1 (Week 1): STG=LTG due to LOS  Skilled Therapeutic Interventions/Progress Updates:   Treatment Session 1 Received pt sitting in recliner, pt agreeable to therapy, and denied any pain during session but reported fatigue from not sleeping well last night. Session with emphasis on functional mobility/transfers, generalized strengthening, dynamic standing balance/coordination, ambulation, and improved activity tolerance. Donned shoes with max A for time management purposes. Pt ambulated 139ft x 1 and 19ft x 1 using rollator and supervision to 44M ortho gym with total A to manage IV pole with 2 seated and 1 standing rest break as pt reports her legs feel "weak" this morning. Pt performed the following exercises standing with BUE support on rollator and supervision for balance: -mini squats 2x10 -alternating marches 2x10 bilaterally -hip abduction 1x10 bilaterally (pt reported not having enough "strength" to balance on one leg and returned to sitting to rest) Pt ambulated 107ft x 1 and 176ft x1 with rollator and supervision back to room with 2 extensive seated rest breaks and 1 standing rest break. Pt appeared more fatigued this morning compared to last few days but remains motivated to continue through therapy sessions. Concluded session with pt sitting in recliner with all needs within reach.   Treatment Session 2 Received pt sitting in recliner, pt agreeable to therapy, and denied any pain during session but reported fatigue; IV now disconnected. Session with emphasis on functional mobility/transfers, generalized strengthening, dynamic standing balance/coordination, ambulation, and improved activity tolerance. Pt  ambulated 141ft x 1 and 134ft x 2 trials with rollator and supervision to 4W dayroom with 1 seated rest break and 2 standing rest breaks. Pt demonstrated improvements in endurance this afternoon requiring less frequent rest breaks. Pt performed seated BLE strengthening sitting on rollator on Kinetron at 20 cm/sec for 1 minute x 4 trials with 1 UE support and supervision with emphasis on glute/quad strengthening with pillow behind back for support. CSW present during session to discuss wound care upon D/C. Pt ambulated >317ft x 1 and 124ft x 1 using rollator and supervision back to room with 1 seated and 1 standing rest break. Concluded session with pt sitting in recliner with all needs within reach.   Therapy Documentation Precautions:  Precautions Precautions: Fall Precaution Comments: has hx of orthostatic hypotension Restrictions Weight Bearing Restrictions: No LUE Weight Bearing: Weight bearing as tolerated LLE Weight Bearing: Weight bearing as tolerated  Therapy/Group: Individual Therapy Alfonse Alpers PT, DPT   02/20/2021, 7:26 AM

## 2021-02-20 NOTE — Progress Notes (Signed)
Patient ID: Kayla Harrison, female   DOB: 04/16/1946, 75 y.o.   MRN: [id number] Team Conference Report to Patient/Family  Team Conference discussion was reviewed with the patient and caregiver, including goals, any changes in plan of care and target discharge date.  Patient and caregiver express understanding and are in agreement.  The patient has a target discharge date of 02/22/21.  SW spoke with pt and sister. Provided conference updates. Sw will send new orders to [facility], pt requesting additional nursing care, sw will follow up. SW waiting determination of wound VAC removal this evening. No additional questions or concerns sw will follow up. Dyanne Iha 02/20/2021, 1:56 PM

## 2021-02-20 NOTE — Progress Notes (Signed)
Subjective:  No new complaints   Antibiotics:  Anti-infectives (From admission, onward)   Start     Dose/Rate Route Frequency Ordered Stop   02/13/21 1630  Ampicillin-Sulbactam (UNASYN) 3 g in sodium chloride 0.9 % 100 mL IVPB  Status:  Discontinued        3 g 200 mL/hr over 30 Minutes Intravenous Every 6 hours 02/13/21 1543 02/20/21 1027      Medications: Scheduled Meds: . amiodarone  200 mg Oral Daily  . Chlorhexidine Gluconate Cloth  6 each Topical BID  . docusate sodium  100 mg Oral BID  . fludrocortisone  0.05 mg Oral BID WC  . levothyroxine  137 mcg Oral Once per day on Sun Mon Tue Wed Thu Fri  . levothyroxine  68.5 mcg Oral Once per day on Sat  . [START ON 02/21/2021] midodrine  5 mg Oral Daily  . polyethylene glycol  17 g Oral BID  . rosuvastatin  10 mg Oral Once per day on Mon Thu  . senna  2 tablet Oral QHS   Continuous Infusions:  PRN Meds:.acetaminophen, bisacodyl, HYDROcodone-acetaminophen, lactulose, ondansetron **OR** ondansetron (ZOFRAN) IV, sodium chloride flush, sodium chloride flush, temazepam    Objective: Weight change:   Intake/Output Summary (Last 24 hours) at 02/20/2021 1549 Last data filed at 02/20/2021 1345 Gross per 24 hour  Intake 1160 ml  Output 250 ml  Net 910 ml   Blood pressure 132/79, pulse (!) 106, temperature (!) 97.4 F (36.3 C), temperature source Oral, resp. rate 18, SpO2 98 %. Temp:  [97.4 F (36.3 C)-98.6 F (37 C)] 97.4 F (36.3 C) (05/31 1328) Pulse Rate:  [106-108] 106 (05/31 1328) Resp:  [18] 18 (05/31 1328) BP: (132-150)/(79-107) 132/79 (05/31 1328) SpO2:  [95 %-100 %] 98 % (05/31 1328)  Physical Exam: Physical Exam Constitutional:      General: She is not in acute distress.    Appearance: She is well-developed. She is not diaphoretic.  HENT:     Head: Normocephalic and atraumatic.     Right Ear: External ear normal.     Left Ear: External ear normal.     Mouth/Throat:     Pharynx: No oropharyngeal  exudate.  Eyes:     General: No scleral icterus.    Conjunctiva/sclera: Conjunctivae normal.     Pupils: Pupils are equal, round, and reactive to light.  Cardiovascular:     Rate and Rhythm: Normal rate. Rhythm irregular.     Heart sounds: Normal heart sounds. No murmur heard. No friction rub. No gallop.   Pulmonary:     Effort: Pulmonary effort is normal. No respiratory distress.     Breath sounds: Normal breath sounds. No wheezing or rales.  Abdominal:     General: Bowel sounds are normal. There is no distension.     Palpations: Abdomen is soft.     Tenderness: There is no abdominal tenderness. There is no rebound.  Musculoskeletal:        General: No tenderness. Normal range of motion.  Lymphadenopathy:     Cervical: No cervical adenopathy.  Skin:    General: Skin is warm and dry.     Coloration: Skin is not pale.     Findings: No erythema or rash.  Neurological:     General: No focal deficit present.     Mental Status: She is alert and oriented to person, place, and time.     Motor: No abnormal muscle tone.  Coordination: Coordination normal.  Psychiatric:        Mood and Affect: Mood normal.        Behavior: Behavior normal.        Thought Content: Thought content normal.        Judgment: Judgment normal.     Left hip with vacuum dressing   CBC:    BMET Recent Labs    02/19/21 1739  NA 137  K 3.4*  CL 103  CO2 28  GLUCOSE 108*  BUN 13  CREATININE 1.07*  CALCIUM 8.1*     Liver Panel  No results for input(s): PROT, ALBUMIN, AST, ALT, ALKPHOS, BILITOT, BILIDIR, IBILI in the last 72 hours.     Sedimentation Rate Recent Labs    02/19/21 1739  ESRSEDRATE 36*   C-Reactive Protein Recent Labs    02/19/21 1739  CRP 0.8    Micro Results: Recent Results (from the past 720 hour(s))  Blood culture (routine x 2)     Status: None   Collection Time: 02/05/21  2:58 PM   Specimen: BLOOD LEFT ARM  Result Value Ref Range Status   Specimen  Description BLOOD LEFT ARM  Final   Special Requests   Final    BOTTLES DRAWN AEROBIC AND ANAEROBIC Blood Culture adequate volume   Culture   Final    NO GROWTH 6 DAYS Performed at [facility], [place] [address] [place], [place] [address]    Report Status 02/11/2021 FINAL  Final  Aerobic Culture w Gram Stain (superficial specimen)     Status: None   Collection Time: 02/05/21  3:38 PM   Specimen: Wound  Result Value Ref Range Status   Specimen Description WOUND  Final   Special Requests NONE  Final   Gram Stain   Final    ABUNDANT WBC PRESENT, PREDOMINANTLY PMN ABUNDANT GRAM NEGATIVE RODS MODERATE GRAM POSITIVE COCCI Performed at [facility], [place] [address], [place], [place] [address]    Culture   Final    MODERATE ESCHERICHIA COLI MODERATE ENTEROCOCCUS FAECALIS    Report Status 02/09/2021 FINAL  Final   Organism ID, Bacteria ESCHERICHIA COLI  Final   Organism ID, Bacteria ENTEROCOCCUS FAECALIS  Final      Susceptibility   Escherichia coli - MIC*    AMPICILLIN >=32 RESISTANT Resistant     CEFAZOLIN <=4 SENSITIVE Sensitive     CEFEPIME <=0.12 SENSITIVE Sensitive     CEFTAZIDIME <=1 SENSITIVE Sensitive     CEFTRIAXONE <=0.25 SENSITIVE Sensitive     CIPROFLOXACIN <=0.25 SENSITIVE Sensitive     GENTAMICIN <=1 SENSITIVE Sensitive     IMIPENEM <=0.25 SENSITIVE Sensitive     TRIMETH/SULFA >=320 RESISTANT Resistant     AMPICILLIN/SULBACTAM 16 INTERMEDIATE Intermediate     PIP/TAZO <=4 SENSITIVE Sensitive     * MODERATE ESCHERICHIA COLI   Enterococcus faecalis - MIC*    AMPICILLIN <=2 SENSITIVE Sensitive     VANCOMYCIN 1 SENSITIVE Sensitive     GENTAMICIN SYNERGY RESISTANT Resistant     * MODERATE ENTEROCOCCUS FAECALIS  Resp Panel by RT-PCR (Flu A&B, Covid) Nasopharyngeal Swab     Status: None   Collection Time: 02/05/21  4:00 PM   Specimen: Nasopharyngeal Swab; Nasopharyngeal(NP) swabs in vial transport medium  Result Value Ref Range Status   SARS Coronavirus 2 by  RT PCR NEGATIVE NEGATIVE Final    Comment: (NOTE) SARS-CoV-2 target nucleic acids are NOT DETECTED.  The SARS-CoV-2 RNA is generally detectable in  upper respiratory specimens during the acute phase of infection. The lowest concentration of SARS-CoV-2 viral copies this assay can detect is 138 copies/mL. A negative result does not preclude SARS-Cov-2 infection and should not be used as the sole basis for treatment or other patient management decisions. A negative result may occur with  improper specimen collection/handling, submission of specimen other than nasopharyngeal swab, presence of viral mutation(s) within the areas targeted by this assay, and inadequate number of viral copies(<138 copies/mL). A negative result must be combined with clinical observations, patient history, and epidemiological information. The expected result is Negative.  Fact Sheet for Patients:  EntrepreneurPulse.com.au  Fact Sheet for Healthcare Providers:  IncredibleEmployment.be  This test is no t yet approved or cleared by the [place] FDA and  has been authorized for detection and/or diagnosis of SARS-CoV-2 by FDA under an Emergency Use Authorization (EUA). This EUA will remain  in effect (meaning this test can be used) for the duration of the COVID-19 declaration under Section 564(b)(1) of the Act, 21 U.S.C.section 360bbb-3(b)(1), unless the authorization is terminated  or revoked sooner.       Influenza A by PCR NEGATIVE NEGATIVE Final   Influenza B by PCR NEGATIVE NEGATIVE Final    Comment: (NOTE) The Xpert Xpress SARS-CoV-2/FLU/RSV plus assay is intended as an aid in the diagnosis of influenza from Nasopharyngeal swab specimens and should not be used as a sole basis for treatment. Nasal washings and aspirates are unacceptable for Xpert Xpress SARS-CoV-2/FLU/RSV testing.  Fact Sheet for Patients: EntrepreneurPulse.com.au  Fact Sheet  for Healthcare Providers: IncredibleEmployment.be  This test is not yet approved or cleared by the [place] FDA and has been authorized for detection and/or diagnosis of SARS-CoV-2 by FDA under an Emergency Use Authorization (EUA). This EUA will remain in effect (meaning this test can be used) for the duration of the COVID-19 declaration under Section 564(b)(1) of the Act, 21 U.S.C. section 360bbb-3(b)(1), unless the authorization is terminated or revoked.  Performed at [facility], [place] [address] [place], [place] [address]   Surgical pcr screen     Status: None   Collection Time: 02/06/21  1:51 AM   Specimen: Nasal Mucosa; Nasal Swab  Result Value Ref Range Status   MRSA, PCR NEGATIVE NEGATIVE Final   Staphylococcus aureus NEGATIVE NEGATIVE Final    Comment: (NOTE) The Xpert SA Assay (FDA approved for NASAL specimens in patients 25 years of age and older), is one component of a comprehensive surveillance program. It is not intended to diagnose infection nor to guide or monitor treatment. Performed at [facility], [place] [address] [place], [place] [address]   Culture, blood (Routine X 2) w Reflex to ID Panel     Status: None   Collection Time: 02/06/21  5:29 AM   Specimen: BLOOD RIGHT HAND  Result Value Ref Range Status   Specimen Description BLOOD RIGHT HAND  Final   Special Requests   Final    BOTTLES DRAWN AEROBIC AND ANAEROBIC Blood Culture adequate volume   Culture   Final    NO GROWTH 5 DAYS Performed at [facility], [place] [address] [place], [place] [address]    Report Status 02/11/2021 FINAL  Final  Aerobic/Anaerobic Culture w Gram Stain (surgical/deep wound)     Status: None   Collection Time: 02/06/21  9:47 AM   Specimen: PATH Other; Tissue  Result Value Ref Range Status   Specimen Description TISSUE  Final   Special  Requests LEFT HIP ABSC SPEC A  Final   Gram Stain   Final    FEW WBC PRESENT,BOTH PMN AND  MONONUCLEAR MODERATE GRAM POSITIVE COCCI RARE GRAM NEGATIVE RODS    Culture   Final    ABUNDANT ESCHERICHIA COLI ABUNDANT ENTEROCOCCUS FAECALIS MODERATE ACINETOBACTER CALCOACETICUS/BAUMANNII COMPLEX MODERATE BACTEROIDES THETAIOTAOMICRON BETA LACTAMASE POSITIVE SEE SEPARATE REPORT FOR ADDITIONAL SUSCEPTIBILITY RESULTS Performed at [facility], [place] [address], [place], [place] [address]    Report Status 02/19/2021 FINAL  Final   Organism ID, Bacteria ESCHERICHIA COLI  Final   Organism ID, Bacteria ENTEROCOCCUS FAECALIS  Final   Organism ID, Bacteria ACINETOBACTER CALCOACETICUS/BAUMANNII COMPLEX  Final      Susceptibility   Acinetobacter calcoaceticus/baumannii complex - MIC*    CEFTAZIDIME >=64 RESISTANT Resistant     CIPROFLOXACIN >=4 RESISTANT Resistant     GENTAMICIN <=1 SENSITIVE Sensitive     IMIPENEM >=16 RESISTANT Resistant     PIP/TAZO >=128 RESISTANT Resistant     TRIMETH/SULFA >=320 RESISTANT Resistant     AMPICILLIN/SULBACTAM 8 SENSITIVE Sensitive     * MODERATE ACINETOBACTER CALCOACETICUS/BAUMANNII COMPLEX   Escherichia coli - MIC*    AMPICILLIN >=32 RESISTANT Resistant     CEFAZOLIN <=4 SENSITIVE Sensitive     CEFEPIME <=0.12 SENSITIVE Sensitive     CEFTAZIDIME <=1 SENSITIVE Sensitive     CEFTRIAXONE <=0.25 SENSITIVE Sensitive     CIPROFLOXACIN <=0.25 SENSITIVE Sensitive     GENTAMICIN <=1 SENSITIVE Sensitive     IMIPENEM <=0.25 SENSITIVE Sensitive     TRIMETH/SULFA >=320 RESISTANT Resistant     AMPICILLIN/SULBACTAM 8 SENSITIVE Sensitive     PIP/TAZO <=4 SENSITIVE Sensitive     * ABUNDANT ESCHERICHIA COLI   Enterococcus faecalis - MIC*    AMPICILLIN <=2 SENSITIVE Sensitive     VANCOMYCIN 1 SENSITIVE Sensitive     GENTAMICIN SYNERGY RESISTANT Resistant     * ABUNDANT ENTEROCOCCUS FAECALIS    Studies/Results: No results found.    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 female with polymicrobial soft tissue abscess with Enterococcus faecalis E. coli and multidrug resistant Acinetobacter isolated on culture.  1.  Polymicrobial abscess  Given that she has received more than 14 days of effective antimicrobial therapy against the organisms that were isolated from her wound after debridement I think it is safe to stop antibiotics and observe her off antibiotics.  She is concerned about her ability to pack the wound once the wound vacuum dressing comes off.  Will defer to surgery and primary team.  I spent greater than 35 minutes with the patient including greater than 50% of time in face to face counsel of the patient regarding the nature of her organisms reviewing her radiographs or laboratory microbiological data and and in coordination of her care    LOS: 7 days   Alcide Evener 02/20/2021, 3:49 PM

## 2021-02-20 NOTE — Telephone Encounter (Signed)
Pt's age 75, wt 94.5 kg, SCr 1.07, CrCl 66.73, last ov w/ SK 08/18/20.

## 2021-02-20 NOTE — Progress Notes (Signed)
Contacted Dr. Lucianne Lei Dam--> antibiotics d/c as has been more than 2 weeks and will give chance to monitor while hospitalized. Left message for Dr. Lucia Gaskins to check wound--VAC expires tomorrow. Contacted EP PA regarding BP/insomnia--ok to wean midodrine with elevated BP in evenings and to avoid any QT prolongation medications.  Patient reports insomnia was due to frequency from IV's ---> she will like to see how she does tonight w/o IVF on board and reevaluate need for adjustment tomorrow.

## 2021-02-20 NOTE — Progress Notes (Addendum)
PROGRESS NOTE   Subjective/Complaints: Concerned about wound and duration of IV antibiotics- will contact ID and Dr. Lucia Gaskins today regarding duration of IV abx and wound assessment/potential vac removal. Sleeping poorly- will discuss medication options with cardiology  ROS: Patient denies CP, SOB, N/V/D, dizziness   Objective:   No results found. Recent Labs    02/19/21 1739  WBC 7.5  HGB 8.6*  HCT 28.3*  PLT 270   Recent Labs    02/19/21 1739  NA 137  K 3.4*  CL 103  CO2 28  GLUCOSE 108*  BUN 13  CREATININE 1.07*  CALCIUM 8.1*    Intake/Output Summary (Last 24 hours) at 02/20/2021 1022 Last data filed at 02/20/2021 0823 Gross per 24 hour  Intake 1160 ml  Output 250 ml  Net 910 ml        Physical Exam: Vital Signs Blood pressure 136/86, pulse (!) 108, temperature 98.6 F (37 C), temperature source Oral, resp. rate 18, SpO2 95 %. Gen: no distress, normal appearing HEENT: oral mucosa pink and moist, NCAT Cardio: Reg rate Chest: normal effort, normal rate of breathing Abd: soft, non-distended Ext: no edema Psych: pleasant, normal affect  Skin: left thigh wound  with vac. Neurologic: Cranial nerves II through XII intact, motor strength is 5/5 in bilateral deltoid, bicep, tricep, grip, hip flexor, 4- RIght and 3- left knee extensors, ankle dorsiflexor and plantar flexor Sensory exam normal sensation to light touch  in bilateral upper and lower extremities  Musculoskeletal: left thigh with swelling      Assessment/Plan: 1. Functional deficits which require 3+ hours per day of interdisciplinary therapy in a comprehensive inpatient rehab setting.  Physiatrist is providing close team supervision and 24 hour management of active medical problems listed below.  Physiatrist and rehab team continue to assess barriers to discharge/monitor patient progress toward functional and medical goals  Care  Tool:  Bathing    Body parts bathed by patient: Right arm,Chest,Left arm,Abdomen,Front perineal area,Right upper leg,Left upper leg,Right lower leg,Left lower leg,Face,Buttocks         Bathing assist Assist Level: Contact Guard/Touching assist     Upper Body Dressing/Undressing Upper body dressing   What is the patient wearing?: Hospital gown only    Upper body assist Assist Level: Minimal Assistance - Patient > 75%    Lower Body Dressing/Undressing Lower body dressing      What is the patient wearing?: Pants,Underwear/pull up     Lower body assist Assist for lower body dressing: Minimal Assistance - Patient > 75%     Toileting Toileting    Toileting assist Assist for toileting: Contact Guard/Touching assist     Transfers Chair/bed transfer  Transfers assist     Chair/bed transfer assist level: Supervision/Verbal cueing Chair/bed transfer assistive device: Other (rollator)   Locomotion Ambulation   Ambulation assist      Assist level: Supervision/Verbal cueing Assistive device: Rollator Max distance: 155f   Walk 10 feet activity   Assist     Assist level: Supervision/Verbal cueing Assistive device: Rollator   Walk 50 feet activity   Assist Walk 50 feet with 2 turns activity did not occur: Safety/medical concerns (fatigue, weakness, decreased  balance)  Assist level: Supervision/Verbal cueing Assistive device: Rollator    Walk 150 feet activity   Assist Walk 150 feet activity did not occur: Safety/medical concerns (fatigue, weakness, decreased balance)  Assist level: Supervision/Verbal cueing Assistive device: Rollator    Walk 10 feet on uneven surface  activity   Assist Walk 10 feet on uneven surfaces activity did not occur: Safety/medical concerns (fatigue, weakness, decreased balance)   Assist level: Supervision/Verbal cueing Assistive device: Rollator   Wheelchair     Assist Will patient use wheelchair at discharge?:  No             Wheelchair 50 feet with 2 turns activity    Assist            Wheelchair 150 feet activity     Assist          Blood pressure 136/86, pulse (!) 108, temperature 98.6 F (37 C), temperature source Oral, resp. rate 18, SpO2 95 %.    Medical Problem List and Plan: 1.Debilitysecondary to infected thigh wound hematoma. Status post I&D 02/06/2021 with a wound VAC and drain placement -patient maynot yetshower -ELOS/Goals: 7-10 days, supervision to mod I goals with PT and OT- upgrade from 15/7 to regular.   -Continue CIR therapies including PT, OT  2. Antithrombotics: -DVT/anticoagulation:SCDs. Chronic anticoagulation currently on hold -antiplatelet therapy: N/A 3. Post-operative paint:Hydrocodone as needed- continue 4. Mood:Provide emotional support -antipsychotic agents: N/A 5. Neuropsych: This patientiscapable of making decisions on herown behalf. 6. Skin/Wound Care:continue vac, JP drains out- will need to have plan for dressing changes post d/c pt anxious about this  -edema control with elevation. Pt doesn't want TEDS or ACE wraps 7. Fluids/Electrolytes/Nutrition:Routine in and outs with follow-up chemistries 8. Acute blood loss anemia. Discussed Hgb 8.6, repeat Monday.  9. Atrial fibrillation/pacemaker. Continue Amiodarone 200 mg daily. Cardiac rate is some whatcontrolled. Eliquis on hold due to hematoma -pt continues to go in and out of atrial fib. Cardiology following. Will alert them to plan for d/c thursday 10. Orthostasis. Florinef 0.05 mg twice daily, ProAmatine 5 mg twice daily. Monitor with increased mobility  -wean midodrine as per cardiology 11. ID. E. Coli/E Faecalis. Currently remains on Unasyn. Abs d/ced 5/31 -labs reviewed, CRP down to 0.8 and ESR to 36.  12. Hypothyroidism. Synthroid 13. NHL with SVC syndrome/lung cancer with  radiation therapy as well as chemo. Follow-up Dr. Lindi Adie 14. Obesity. Status post lap band surgery. BMI 33.64. Dietary follow-up 15. Hyperlipidemia. Crestor  16.  Vascular access- PICC placed for IV abx. May d/c prior to discharge.  17. Constipation: improved with senna 2 tabs HS 18. Insomnia: .due to frequent urination from  IV fluids- being d/ced 5/32- continue temazepam.  19. Disposition: d/c home Thursday  >35 minutes spent in discussion of antibiotic duration with patient, ID, and team; review of ESR and CRP; discussion of insomnia and plan to continue temazepam and see if this improves with IV fluid cessation; requesting Dr. Pollie Friar eval of wound and plan for woud vac discontinuation, requesting SW inform patient of wound care services she will qualify for- >50% time spent in direct care and coordination of care.  LOS: 7 days A FACE TO FACE EVALUATION WAS PERFORMED  Martha Clan P Christifer Chapdelaine 02/20/2021, 10:22 AM

## 2021-02-20 NOTE — Discharge Summary (Signed)
Physician Discharge Summary  Patient ID: Kayla Harrison MRN: [id number] DOB/AGE: Oct 06, 1945 75 y.o.  Admit date: 02/13/2021 Discharge date: 02/23/2021  Discharge Diagnoses:  Principal Problem:   Debility Active Problems:   Polymicrobial bacterial infection   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   Enterococcus faecalis infection   Sepsis due to Escherichia coli ([place])   Lower extremity edema Acute blood loss anemia Atrial fibrillation with pacemaker Orthostasis Hypothyroidism Hyperlipidemia Constipation  Discharged Condition: Stable  Significant Diagnostic Studies: IR PICC PLACEMENT RIGHT >5 YRS INC IMG GUIDE  Result Date: 02/14/2021 INDICATION: 75 year old female with a history of infection referred for PICC EXAM: IMAGE GUIDED PLACEMENT OF RIGHT UPPER EXTREMITY PICC MEDICATIONS: None ANESTHESIA/SEDATION: None FLUOROSCOPY TIME:  Fluoroscopy Time: 0 minutes 6 seconds (1 mGy). COMPLICATIONS: None PROCEDURE: Informed written consent was obtained from the patient after a thorough discussion of the procedural risks, benefits and alternatives. All questions were addressed. Maximal Sterile Barrier Technique was utilized including caps, mask, sterile gowns, sterile gloves, sterile drape, hand hygiene and skin antiseptic. A timeout was performed prior to the initiation of the procedure. Patient was position in the supine position on the fluoroscopy table with the right arm abducted 90 degrees. Ultrasound survey of the upper extremity was performed with images stored and sent to PACs. The right brachial vein was selected for access. Dual/parallel brachial veins. Once the patient was prepped and draped in the usual sterile fashion, the skin and subcutaneous tissues were generously infiltrated with 1% lidocaine for local anesthesia. A micropuncture access kit was then used to access the targeted vein. Wire was passed centrally, confirmed to be within the venous system under fluoroscopy. A small stab  incision was made with an 11 blade scalpel and the sheath was then placed over the wire. Estimated length of the catheter was then performed with the indwelling wire. Catheter was amputated at 37 cm length and placed with coaxial wire through the peel-away. Double lumen, power injectable PICC in the right brachial vein. Tip confirmed at the cavoatrial junction, and the catheter is ready for use. Stat lock was placed. Patient tolerated the procedure well and remained hemodynamically stable throughout. No complications were encountered and no significant blood loss was encountered. IMPRESSION: Status post right upper extremity PICC. Signed, Dulcy Fanny. Dellia Nims, RPVI [facility] Radiology Electronically Signed   By: Corrie Mckusick D.O.   On: 02/14/2021 17:04    Labs:  Basic Metabolic Panel: Recent Labs  Lab 02/19/21 1739 02/21/21 1556  NA 137 138  K 3.4* 3.7  CL 103 104  CO2 28 28  GLUCOSE 108* 102*  BUN 13 10  CREATININE 1.07* 0.94  CALCIUM 8.1* 8.3*  MG  --  2.0    CBC: Recent Labs  Lab 02/19/21 1739  WBC 7.5  NEUTROABS 5.7  HGB 8.6*  HCT 28.3*  MCV 97.9  PLT 270    CBG: No results for input(s): GLUCAP in the last 168 hours.  Family history.  Father with CAD and colon cancer as well as myocardial infarction.  Sister with diabetes.  Denies any esophageal or rectal cancer  Brief HPI:   Kayla Harrison is a 75 y.o. right-handed female with history of thyroid cancer, hypothyroidism, NHL/SVC syndrome/lung cancer radiation chemotherapy followed by oncology services, pacemaker atrial fibrillation maintained on Eliquis followed by Dr. Caryl Comes, obesity status post lap band surgery hypertension hyperlipidemia recurrent falls.  Well-known to rehab services 12/22/2020 to 01/09/2021 for functional deficits related to left coronoid fracture  and left hip labral hamstring tendon tear with associated large hematoma requiring I&D and wound VAC.  She  returned to the ER 01/10/2021 for open wound left thigh that was bleeding from prior hematoma I&D site which was not actively bleeding and hemoglobin stable was discharged back to home.  Per chart review lives alone independent assistive device.  Presented 02/05/2021 with purulent malodorous drainage x1 week from left hip wound dehiscence.  Admission chemistries unremarkable except glucose 103 INR 1.6 lactic acid 1.1 blood cultures no growth to date WBC 8000.  Patient underwent incision and drainage of left hip deep abscess hematoma excisional debridement of necrotic skin subcutaneous tissue muscle and fascia with revision of closure drain placement 02/06/2021 per Dr. Melony Overly and wound VAC was placed.  Wound cultures after I&D E. Coli faecalis maintained on Unasyn through 03/14/2021.  It was noted central part of wound head dehisced a bit no longer tight with suture material undergoing revision with washout.  Acute blood loss anemia 8.1 and monitored.  Sedimentation rate mildly elevated 45.  Chronic Eliquis initially on hold due to hematoma.  Close monitoring of blood pressure remaining on ProAmatine as well as Florinef.  Therapy evaluations completed due to patient decreased functional mobility was admitted for a comprehensive rehab program.   Hospital Course: Kayla Harrison was admitted to rehab 02/13/2021 for inpatient therapies to consist of PT, ST and OT at least three hours five days a week. Past admission physiatrist, therapy team and rehab RN have worked together to provide customized collaborative inpatient rehab.  Pertaining to patient's debility secondary to infected thigh wound hematoma status post I&D washout wound VAC drain placement follow-up orthopedic services Dr. Lucia Gaskins.  Chronic anticoagulation on hold and monitored.  Hydrocodone ongoing for pain management.  Acute blood loss anemia stable 8.6.  As noted patient did have history of atrial fibrillation pacemaker followed by Dr. Caryl Comes  remained on amiodarone and continued to go in and out of atrial fibrillation with cardiology services notified.  Bouts of orthostasis Florinef ProAmatine ongoing.  In regards to E. Coli/Faecakis per wound cultures remained on Unasyn on Unasyn 3 g every 6 hours per infectious disease..  Patient had been on contact precautions.  She remained afebrile.  Hypothyroidism with hormone supplement ongoing.  History of NHL with SVC syndrome lung cancer radiation chemotherapy follow-up outpatient oncology services.  Crestor ongoing for hyperlipidemia.   Blood pressures were monitored on TID basis and soft and monitored     Rehab course: During patient's stay in rehab weekly team conferences were held to monitor patient's progress, set goals and discuss barriers to discharge. At admission, patient required moderate assist supine to sit minimal assist sit to supine minimal guard stand pivot transfers minimal assist 50 feet rolling walker independent eating set up upper body bathing moderate assist lower body bathing moderate assist lower body dressing  Physical exam.  Blood pressure 150/74 pulse 73 temperature 98 respirations 18 oxygen saturation 96% room air Constitutional.  No acute distress HEENT Head.  Normocephalic and atraumatic Eyes.  Pupils round and reactive to light no discharge without nystagmus Neck.  Supple nontender no JVD without thyromegaly Cardiac irregular irregular Abdomen.  Soft nontender positive bowel sounds without rebound Respiratory effort normal no respiratory distress without wheeze Skin.  Left leg with VAC and serosanguineous drainage with drain in place Neurologic.  Alert oriented normal insight and awareness cranial nerve exam unremarkable.  Upper extremity motor 5/5, left lower extremity 3/5 proximal to 4/5 distal.  Right  lower extremity 4 -/5 proximal to 4/5 distal no sensory findings appreciated.  He/She  has had improvement in activity tolerance, balance, postural control  as well as ability to compensate for deficits. He/She has had improvement in functional use RUE/LUE  and RLE/LLE as well as improvement in awareness.  Patient attempted use of reacher and shoe horn to problem solve donning shoes, patient able to complete without use of assistive device with increased time.  Ambulated on and off elevator and over unlevel surfaces outside all therapies managed IV pole.  She can ambulate up to 200 feet with rolling walker.  Full family teaching completed plan discharged to home       Disposition: Discharged to home    Diet: Regular  Special Instructions: No driving smoking or alcohol  Continue to hold Eliquis until further notice  Wound VAC changes 2 times weekly at home per Dr. Lucia Gaskins  Follow-up infectious disease 03/05/2021  Medications at discharge. 1.  Tylenol as needed 2.  Amiodarone 200 mg p.o. daily 3.  Colace 100 mg p.o. twice daily 4.  Florinef 0.05 mg p.o. twice daily 5.  Hydrocodone 1 to 2 tablets every 4 as needed pain 6.  Synthroid 137 mcg once per day on Sunday Monday Tuesday Wednesday Thursday Friday and 68.5 mcg once per day on Saturdays 7.  ProAmatine 5 mg p.o. daily 8.  MiraLAX twice daily hold for loose stools 9.  Crestor 10 mg p.o. 2 times weekly once per day on Mondays and Thursdays 10.  Unasyn 3 g intravenously every 6 hours x 3 additional more weeks with end date 03/14/2021   30-35 minutes were spent completing discharge summary and discharge planning  Discharge Instructions    [facility] pharmacist to adjust dose for Vancomycin, Aminoglycosides and other anti-infective therapies as requested by physician.   Complete by: As directed    [facility] to provide Cath Flo 56m   Complete by: As directed    Administer for PICC line occlusion and as ordered by physician for other access device issues.   Anaphylaxis Kit: Provided to treat any anaphylactic reaction to the medication being provided to the patient  if First Dose or when requested by physician   Complete by: As directed    Epinephrine 160mml vial / amp: Administer 0.39m71m0.39ml9mubcutaneously once for moderate to severe anaphylaxis, nurse to call physician and pharmacy when reaction occurs and call 911 if needed for immediate care   Diphenhydramine 50mg32mIV vial: Administer 25-50mg 40mM PRN for first dose reaction, rash, itching, mild reaction, nurse to call physician and pharmacy when reaction occurs   Sodium Chloride 0.9% NS 500ml I29mdminister if needed for hypovolemic blood pressure drop or as ordered by physician after call to physician with anaphylactic reaction   Change dressing on IV access line weekly and PRN   Complete by: As directed    Flush IV access with Sodium Chloride 0.9% and Heparin 10 units/ml or 100 units/ml   Complete by: As directed    Home infusion instructions - [facility]   Complete by: As directed    Instructions: Flush IV access with Sodium Chloride 0.9% and Heparin 10units/ml or 100units/ml   Change dressing on IV access line: Weekly and PRN   Instructions Cath Flo 2mg: Ad38mister for PICC Line occlusion and as ordered by physician for other access device   [facility] pharmacist to adjust dose for: Vancomycin, Aminoglycosides and other anti-infective therapies as requested by physician  Method of administration may be changed at the discretion of home infusion pharmacist based upon assessment of the patient and/or caregiver's ability to self-administer the medication ordered   Complete by: As directed        Follow-up Information    Raulkar, Clide Deutscher, MD Follow up.   Specialty: Physical Medicine and Rehabilitation Why: 06/05/21 please arrive at 11:00am  for 11:20am appointment, thank you! Contact information: [address] [place] [address] [phone]        Erle Crocker, MD. Call on 02/26/2021.   Specialty: Orthopedic Surgery Why: Call for  appointment Contact information: [place] [address] [phone]        Deboraha Sprang, MD. Call on 02/26/2021.   Specialty: Cardiology Why: Call for appointment Contact information: [address] [place] [address] [phone]        Nicholas Lose, MD Follow up on 02/26/2021.   Specialty: Hematology and Oncology Why: Call for appointment as needed Contact information: [place] [phone] Ong, Lavell Islam, MD On 03/05/2021.   Specialty: Infectious Diseases Why: Appointment at 3:30 pm.  Contact information: [address] [place] [address] [phone]               Signed: Lavon Paganini [place] 02/23/2021, 10:23 PM

## 2021-02-20 NOTE — Patient Care Conference (Signed)
Inpatient RehabilitationTeam Conference and Plan of Care Update Date: 02/20/2021   Time: 12:59 PM    Patient Name: Kayla Harrison      Medical Record Number: [id number]  Date of Birth: 10/16/1945 Sex: Female         Room/Bed: 5C09C/5C09C-01 Payor Info: Payor: MEDICARE / Plan: MEDICARE PART A AND B / Product Type: *No Product type* /    Admit Date/Time:  02/13/2021  2:43 PM  Primary Diagnosis:  [place] [facility] Problems: Principal Problem:   Debility Active Problems:   Polymicrobial bacterial infection   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   Enterococcus faecalis infection   Sepsis due to Escherichia coli [facility])    Expected Discharge Date: Expected Discharge Date: 02/22/21  Team Members Present: Physician leading conference: Dr. Leeroy Cha Care Coodinator Present: Dorien Chihuahua, RN, BSN, CRRN;Christina [place], [place] Nurse Present: Dorien Chihuahua, RN PT Present: Becky Sax, PT OT Present: Simonne Come, OT PPS Coordinator present : Gunnar Fusi, SLP     Current Status/Progress Goal Weekly Team Focus  Bowel/Bladder   Continent of bowel and bladder; LBM 02/19/21  Remain continent of bowel and bladder with minimal assistance.  Assess bowel and bladder needs q shift and PRN and offer toileting q 2 hours while awake.   Swallow/Nutrition/ Hydration             ADL's   Setup UB bathing and dressing, CGA LB dressing, Distant supervision with ambulation and ambulatory transfers with rollator  Mod I overall  LB dressing, AE education, home making tasks, dynamic standing balance, activity tolerance, energy conservation   Mobility   bed mobility supervision, transfers with RW supervision, gait 190ft with RW and supervision  mod I  functional mobility/transfers, generalized strengthening, dynamic standing balance/coordination, endurance, discharge planning, and improved activity tolerance.   Communication             Safety/Cognition/ Behavioral Observations             Pain   Pt currently rates her pain 2/10. Pt refuses pain medication for her current pain level.  Pain score <3/10.  Assess pain q shift and prn; administer pain medication as directed.   Skin   Wound vac to left thigh wound, blisters to left leg-dressing dry and intact.  Remain free from skin breakdown and infection.  Assess skin q shift and prn.     Discharge Planning:  Goal to d/c MOD I. Sister lives in [place]   Team Discussion: Medical issues addressed per MD. Follow up with ID and Cardiology to adjust meds for sleep and abx. Treatment dates; abx discontinued today and follow up with Ortho for wound vac plan of care for discharge. Plan change wound vac this p.m. per MD.  Patient on target to meet rehab goals: yes, currently supervision for transfers, able to ambulate 150' with rollator and supervision. Discharge goals set for supervision to mod I assist.  *See Care Plan and progress notes for long and short-term goals.   Revisions to Treatment Plan:  Worked on footwear don/doff due to increased lower extremity edema and set up for B+D Teaching Needs: Plan for going home solo, dressing change assistance if patient goes home without the wound vac.   Current Barriers to Discharge: Decreased caregiver support and Wound care  Possible Resolutions to Barriers: HH follow up services (OT +PT) Clarification of wound care (Wound vac vs other dressing) Patient has all DME Sister ([place]) available if needed for short period post discharge  Medical Summary               I attest that I was present, lead the team conference, and concur with the assessment and plan of the team.   Dorien Chihuahua B 02/20/2021, 1:05 PM

## 2021-02-21 LAB — BASIC METABOLIC PANEL
Anion gap: 6 (ref 5–15)
BUN: 10 mg/dL (ref 8–23)
CO2: 28 mmol/L (ref 22–32)
Calcium: 8.3 mg/dL — ABNORMAL LOW (ref 8.9–10.3)
Chloride: 104 mmol/L (ref 98–111)
Creatinine, Ser: 0.94 mg/dL (ref 0.44–1.00)
GFR, Estimated: >60 mL/min (ref >60)
Glucose, Bld: 102 mg/dL — ABNORMAL HIGH (ref 70–99)
Potassium: 3.7 mmol/L (ref 3.5–5.1)
Sodium: 138 mmol/L (ref 135–145)

## 2021-02-21 LAB — MAGNESIUM: Magnesium: 2 mg/dL (ref 1.7–2.4)

## 2021-02-21 MED ORDER — POTASSIUM CHLORIDE CRYS ER 20 MEQ PO TBCR
20.0000 meq | EXTENDED_RELEASE_TABLET | Freq: Two times a day (BID) | ORAL | Status: AC
  Administered 2021-02-21 (×2): 20 meq via ORAL
  Filled 2021-02-21 (×2): qty 1

## 2021-02-21 MED ORDER — AMPICILLIN-SULBACTAM IV (FOR PTA / DISCHARGE USE ONLY)
3.0000 g | Freq: Four times a day (QID) | INTRAVENOUS | 0 refills | Status: DC
Start: 2021-02-21 — End: 2021-03-09

## 2021-02-21 MED ORDER — SODIUM CHLORIDE 0.9 % IV SOLN
3.0000 g | Freq: Four times a day (QID) | INTRAVENOUS | Status: DC
  Administered 2021-02-21 – 2021-02-23 (×9): 3 g via INTRAVENOUS
  Filled 2021-02-21: qty 3
  Filled 2021-02-21: qty 8
  Filled 2021-02-21 (×2): qty 3
  Filled 2021-02-21: qty 8
  Filled 2021-02-21: qty 3
  Filled 2021-02-21 (×3): qty 8
  Filled 2021-02-21 (×5): qty 3

## 2021-02-21 MED ORDER — AMIODARONE HCL 200 MG PO TABS
400.0000 mg | ORAL_TABLET | Freq: Every day | ORAL | Status: DC
  Administered 2021-02-22 – 2021-02-23 (×2): 400 mg via ORAL
  Filled 2021-02-21 (×2): qty 2

## 2021-02-21 NOTE — Progress Notes (Signed)
Recreational Therapy Session Note  Patient Details  Name: Kayla Harrison MRN: [id number] Date of Birth: June 29, 1946 Today's Date: 02/21/2021 LATE ENTRY for 02/20/21 Pain: no c/o  Follow up with pt in regards to use of aromatherapy.  Pt stated no noticeable effects on relaxation or sleep.  NT in to provide toileting assistance.  Deneene Tarver 02/21/2021, 3:54 PM

## 2021-02-21 NOTE — Progress Notes (Signed)
Patient ID: Kayla Harrison, female   DOB: 28-Jan-1946, 75 y.o.   MRN: [id number]   Pt set up with follow up appointment at [place] on next Wednesday, June 8th at 7:30 AM.  Pt set with [place]: [facility] (SN/PT/OT)  Erlene Quan, [place]

## 2021-02-21 NOTE — Progress Notes (Addendum)
     Kayla Harrison is a 75 y.o. female   Orthopaedic diagnosis:left thigh hematoma/abscess status post incision and drainage with revision wound closure VAC placement  Subjective: Patient is mobilizing well.  She denies increasing pain in her thigh.  She has had steady VAC output.  They replaced the canister earlier today.  Objectyive: Vitals:   02/20/21 2001 02/21/21 0321  BP: (!) 146/99 (!) 145/102  Pulse: (!) 105 (!) 110  Resp: 18 18  Temp: 98.6 F (37 C) 97.8 F (36.6 C)  SpO2: 97% 96%     Exam: Awake and alert Respirations even and unlabored No acute distress  Left thigh VAC was removed.  There has been no worsening of her skin dehiscence or necrosis.  Continues with a 2 cm area of open wound centrally within the incision.  The induration has improved somewhat.  There is slight fluctuance noted centrally about the area of abscess and hematoma.  I am able to express approximately 60 cc of hematoma from the central portion of the cavity.  There is a remaining cavity notable.  No notable purulence.  Assessment: Improving left thigh hematoma/abscess with small 2 cm area of open wound.   Plan: We will replace the wound VAC.  The VAC is placed within the open wound to allow for cavity evacuation.  Good suction was obtained.  Given that there is persistent hematoma/abundant coagulated blood formation within the open cavity I would recommend against stopping the antibiotics as long as the patient is tolerating them well even though her CRP has normalized and her ESR is trending down.  She has an open wound that seems to be progressing nicely with VAC therapy and I would be worried for the reaccumulation of hematoma and return of infection given the amount of hematoma I was able to express today.  Certainly may discuss this with infectious disease if they are reluctant to continue the antibiotics.  She will need home wound VAC therapy with twice a week wound VAC changes.   This may be done either at wound care or with home health nursing.  During wound VAC change the remaining cavity with hematoma must be evacuated and irrigated. If the coagulated blood is left within the wound cavity there is a chance of recurrent infection and persistent wound healing issues.  Procedure: The left thigh wound VAC was removed.  There is persistent 2 cm area of skin loss.  There was a deep cavity below this approximately 4 cm x 2 cm x 10 cm.  I was able to evacuate approximately 60 cc of hematoma and coagulated blood.  Was no purulence noted.  I then irrigated the cavity with normal saline.  Then a wound VAC was placed.  2 sponges pieces were used.  A small sponge was placed within the wound to keep it open and allow the cavity to drain and a second sponge was placed on top of that.  There was good suction after VAC placement.  The central portion of the cavity was seen to be depressed.   Radene Journey, MD

## 2021-02-21 NOTE — Progress Notes (Signed)
Subjective:  She is anxious about her wound and wants to be as aggressive as a possible in treating the infection that is been present there   Antibiotics:  Anti-infectives (From admission, onward)   Start     Dose/Rate Route Frequency Ordered Stop   02/21/21 1230  Ampicillin-Sulbactam (UNASYN) 3 g in sodium chloride 0.9 % 100 mL IVPB        3 g 200 mL/hr over 30 Minutes Intravenous Every 6 hours 02/21/21 1059     02/13/21 1630  Ampicillin-Sulbactam (UNASYN) 3 g in sodium chloride 0.9 % 100 mL IVPB  Status:  Discontinued        3 g 200 mL/hr over 30 Minutes Intravenous Every 6 hours 02/13/21 1543 02/20/21 1027      Medications: Scheduled Meds: . amiodarone  200 mg Oral Daily  . Chlorhexidine Gluconate Cloth  6 each Topical BID  . docusate sodium  100 mg Oral BID  . fludrocortisone  0.05 mg Oral BID WC  . levothyroxine  137 mcg Oral Once per day on Sun Mon Tue Wed Thu Fri  . levothyroxine  68.5 mcg Oral Once per day on Sat  . midodrine  5 mg Oral Daily  . polyethylene glycol  17 g Oral BID  . potassium chloride  20 mEq Oral BID  . rosuvastatin  10 mg Oral Once per day on Mon Thu  . senna  2 tablet Oral QHS   Continuous Infusions: . ampicillin-sulbactam (UNASYN) IV     PRN Meds:.acetaminophen, bisacodyl, HYDROcodone-acetaminophen, lactulose, ondansetron **OR** ondansetron (ZOFRAN) IV, sodium chloride flush, sodium chloride flush, temazepam    Objective: Weight change:   Intake/Output Summary (Last 24 hours) at 02/21/2021 1218 Last data filed at 02/21/2021 0700 Gross per 24 hour  Intake 600 ml  Output 250 ml  Net 350 ml   Blood pressure (!) 143/105, pulse (!) 109, temperature 98.4 F (36.9 C), temperature source Oral, resp. rate 18, SpO2 97 %. Temp:  [97.4 F (36.3 C)-98.6 F (37 C)] 98.4 F (36.9 C) (06/01 0928) Pulse Rate:  [105-110] 109 (06/01 0928) Resp:  [18] 18 (06/01 0928) BP: (132-146)/(79-105) 143/105 (06/01 0928) SpO2:  [96 %-98 %] 97 % (06/01  0928)  Physical Exam: Physical Exam Constitutional:      General: She is not in acute distress.    Appearance: She is well-developed. She is not diaphoretic.  HENT:     Head: Normocephalic and atraumatic.     Right Ear: External ear normal.     Left Ear: External ear normal.     Mouth/Throat:     Pharynx: No oropharyngeal exudate.  Eyes:     General: No scleral icterus.    Conjunctiva/sclera: Conjunctivae normal.     Pupils: Pupils are equal, round, and reactive to light.  Cardiovascular:     Rate and Rhythm: Normal rate. Rhythm irregular.     Heart sounds: Normal heart sounds. No murmur heard. No friction rub. No gallop.   Pulmonary:     Effort: Pulmonary effort is normal. No respiratory distress.     Breath sounds: Normal breath sounds. No wheezing or rales.  Abdominal:     General: Bowel sounds are normal. There is no distension.     Palpations: Abdomen is soft.     Tenderness: There is no abdominal tenderness. There is no rebound.  Musculoskeletal:        General: No tenderness. Normal range of motion.  Lymphadenopathy:  Cervical: No cervical adenopathy.  Skin:    General: Skin is warm and dry.     Coloration: Skin is not pale.     Findings: No erythema or rash.  Neurological:     General: No focal deficit present.     Mental Status: She is alert and oriented to person, place, and time.     Motor: No abnormal muscle tone.     Coordination: Coordination normal.  Psychiatric:        Mood and Affect: Mood normal.        Behavior: Behavior normal.        Thought Content: Thought content normal.        Judgment: Judgment normal.     Left hip with vacuum dressing   CBC:    BMET Recent Labs    02/19/21 1739  NA 137  K 3.4*  CL 103  CO2 28  GLUCOSE 108*  BUN 13  CREATININE 1.07*  CALCIUM 8.1*     Liver Panel  No results for input(s): PROT, ALBUMIN, AST, ALT, ALKPHOS, BILITOT, BILIDIR, IBILI in the last 72 hours.     Sedimentation  Rate Recent Labs    02/19/21 1739  ESRSEDRATE 36*   C-Reactive Protein Recent Labs    02/19/21 1739  CRP 0.8    Micro Results: Recent Results (from the past 720 hour(s))  Blood culture (routine x 2)     Status: None   Collection Time: 02/05/21  2:58 PM   Specimen: BLOOD LEFT ARM  Result Value Ref Range Status   Specimen Description BLOOD LEFT ARM  Final   Special Requests   Final    BOTTLES DRAWN AEROBIC AND ANAEROBIC Blood Culture adequate volume   Culture   Final    NO GROWTH 6 DAYS Performed at [facility], [place] [address] [place], [place] [address]    Report Status 02/11/2021 FINAL  Final  Aerobic Culture w Gram Stain (superficial specimen)     Status: None   Collection Time: 02/05/21  3:38 PM   Specimen: Wound  Result Value Ref Range Status   Specimen Description WOUND  Final   Special Requests NONE  Final   Gram Stain   Final    ABUNDANT WBC PRESENT, PREDOMINANTLY PMN ABUNDANT GRAM NEGATIVE RODS MODERATE GRAM POSITIVE COCCI Performed at [facility], [place] [address] [place], [place] [address]    Culture   Final    MODERATE ESCHERICHIA COLI MODERATE ENTEROCOCCUS FAECALIS    Report Status 02/09/2021 FINAL  Final   Organism ID, Bacteria ESCHERICHIA COLI  Final   Organism ID, Bacteria ENTEROCOCCUS FAECALIS  Final      Susceptibility   Escherichia coli - MIC*    AMPICILLIN >=32 RESISTANT Resistant     CEFAZOLIN <=4 SENSITIVE Sensitive     CEFEPIME <=0.12 SENSITIVE Sensitive     CEFTAZIDIME <=1 SENSITIVE Sensitive     CEFTRIAXONE <=0.25 SENSITIVE Sensitive     CIPROFLOXACIN <=0.25 SENSITIVE Sensitive     GENTAMICIN <=1 SENSITIVE Sensitive     IMIPENEM <=0.25 SENSITIVE Sensitive     TRIMETH/SULFA >=320 RESISTANT Resistant     AMPICILLIN/SULBACTAM 16 INTERMEDIATE Intermediate     PIP/TAZO <=4 SENSITIVE Sensitive     * MODERATE ESCHERICHIA COLI   Enterococcus faecalis - MIC*    AMPICILLIN <=2 SENSITIVE Sensitive     VANCOMYCIN 1 SENSITIVE  Sensitive     GENTAMICIN SYNERGY RESISTANT Resistant     * MODERATE ENTEROCOCCUS  FAECALIS  Resp Panel by RT-PCR (Flu A&B, Covid) Nasopharyngeal Swab     Status: None   Collection Time: 02/05/21  4:00 PM   Specimen: Nasopharyngeal Swab; Nasopharyngeal(NP) swabs in vial transport medium  Result Value Ref Range Status   SARS Coronavirus 2 by RT PCR NEGATIVE NEGATIVE Final    Comment: (NOTE) SARS-CoV-2 target nucleic acids are NOT DETECTED.  The SARS-CoV-2 RNA is generally detectable in upper respiratory specimens during the acute phase of infection. The lowest concentration of SARS-CoV-2 viral copies this assay can detect is 138 copies/mL. A negative result does not preclude SARS-Cov-2 infection and should not be used as the sole basis for treatment or other patient management decisions. A negative result may occur with  improper specimen collection/handling, submission of specimen other than nasopharyngeal swab, presence of viral mutation(s) within the areas targeted by this assay, and inadequate number of viral copies(<138 copies/mL). A negative result must be combined with clinical observations, patient history, and epidemiological information. The expected result is Negative.  Fact Sheet for Patients:  EntrepreneurPulse.com.au  Fact Sheet for Healthcare Providers:  IncredibleEmployment.be  This test is no t yet approved or cleared by the [place] FDA and  has been authorized for detection and/or diagnosis of SARS-CoV-2 by FDA under an Emergency Use Authorization (EUA). This EUA will remain  in effect (meaning this test can be used) for the duration of the COVID-19 declaration under Section 564(b)(1) of the Act, 21 U.S.C.section 360bbb-3(b)(1), unless the authorization is terminated  or revoked sooner.       Influenza A by PCR NEGATIVE NEGATIVE Final   Influenza B by PCR NEGATIVE NEGATIVE Final    Comment: (NOTE) The Xpert Xpress  SARS-CoV-2/FLU/RSV plus assay is intended as an aid in the diagnosis of influenza from Nasopharyngeal swab specimens and should not be used as a sole basis for treatment. Nasal washings and aspirates are unacceptable for Xpert Xpress SARS-CoV-2/FLU/RSV testing.  Fact Sheet for Patients: EntrepreneurPulse.com.au  Fact Sheet for Healthcare Providers: IncredibleEmployment.be  This test is not yet approved or cleared by the [place] FDA and has been authorized for detection and/or diagnosis of SARS-CoV-2 by FDA under an Emergency Use Authorization (EUA). This EUA will remain in effect (meaning this test can be used) for the duration of the COVID-19 declaration under Section 564(b)(1) of the Act, 21 U.S.C. section 360bbb-3(b)(1), unless the authorization is terminated or revoked.  Performed at [facility], [place] [address] [place], [place] [address]   Surgical pcr screen     Status: None   Collection Time: 02/06/21  1:51 AM   Specimen: Nasal Mucosa; Nasal Swab  Result Value Ref Range Status   MRSA, PCR NEGATIVE NEGATIVE Final   Staphylococcus aureus NEGATIVE NEGATIVE Final    Comment: (NOTE) The Xpert SA Assay (FDA approved for NASAL specimens in patients 34 years of age and older), is one component of a comprehensive surveillance program. It is not intended to diagnose infection nor to guide or monitor treatment. Performed at [facility], [place] [address], [place], [place] [address]   Culture, blood (Routine X 2) w Reflex to ID Panel     Status: None   Collection Time: 02/06/21  5:29 AM   Specimen: BLOOD RIGHT HAND  Result Value Ref Range Status   Specimen Description BLOOD RIGHT HAND  Final   Special Requests   Final    BOTTLES DRAWN AEROBIC AND ANAEROBIC Blood Culture adequate volume   Culture   Final  NO GROWTH 5 DAYS Performed at [facility], [place] [address] [place], [place] [address]    Report Status  02/11/2021 FINAL  Final  Aerobic/Anaerobic Culture w Gram Stain (surgical/deep wound)     Status: None   Collection Time: 02/06/21  9:47 AM   Specimen: PATH Other; Tissue  Result Value Ref Range Status   Specimen Description TISSUE  Final   Special Requests LEFT HIP ABSC SPEC A  Final   Gram Stain   Final    FEW WBC PRESENT,BOTH PMN AND MONONUCLEAR MODERATE GRAM POSITIVE COCCI RARE GRAM NEGATIVE RODS    Culture   Final    ABUNDANT ESCHERICHIA COLI ABUNDANT ENTEROCOCCUS FAECALIS MODERATE ACINETOBACTER CALCOACETICUS/BAUMANNII COMPLEX MODERATE BACTEROIDES THETAIOTAOMICRON BETA LACTAMASE POSITIVE SEE SEPARATE REPORT FOR ADDITIONAL SUSCEPTIBILITY RESULTS Performed at [facility], [place] [address], [place], [place] [address]    Report Status 02/19/2021 FINAL  Final   Organism ID, Bacteria ESCHERICHIA COLI  Final   Organism ID, Bacteria ENTEROCOCCUS FAECALIS  Final   Organism ID, Bacteria ACINETOBACTER CALCOACETICUS/BAUMANNII COMPLEX  Final      Susceptibility   Acinetobacter calcoaceticus/baumannii complex - MIC*    CEFTAZIDIME >=64 RESISTANT Resistant     CIPROFLOXACIN >=4 RESISTANT Resistant     GENTAMICIN <=1 SENSITIVE Sensitive     IMIPENEM >=16 RESISTANT Resistant     PIP/TAZO >=128 RESISTANT Resistant     TRIMETH/SULFA >=320 RESISTANT Resistant     AMPICILLIN/SULBACTAM 8 SENSITIVE Sensitive     * MODERATE ACINETOBACTER CALCOACETICUS/BAUMANNII COMPLEX   Escherichia coli - MIC*    AMPICILLIN >=32 RESISTANT Resistant     CEFAZOLIN <=4 SENSITIVE Sensitive     CEFEPIME <=0.12 SENSITIVE Sensitive     CEFTAZIDIME <=1 SENSITIVE Sensitive     CEFTRIAXONE <=0.25 SENSITIVE Sensitive     CIPROFLOXACIN <=0.25 SENSITIVE Sensitive     GENTAMICIN <=1 SENSITIVE Sensitive     IMIPENEM <=0.25 SENSITIVE Sensitive     TRIMETH/SULFA >=320 RESISTANT Resistant     AMPICILLIN/SULBACTAM 8 SENSITIVE Sensitive     PIP/TAZO <=4 SENSITIVE Sensitive     * ABUNDANT ESCHERICHIA COLI    Enterococcus faecalis - MIC*    AMPICILLIN <=2 SENSITIVE Sensitive     VANCOMYCIN 1 SENSITIVE Sensitive     GENTAMICIN SYNERGY RESISTANT Resistant     * ABUNDANT ENTEROCOCCUS FAECALIS    Studies/Results: No results found.    Assessment/Plan:  INTERVAL HISTORY:   Dr. 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 female with polymicrobial soft tissue abscess with Enterococcus faecalis E. coli and multidrug resistant Acinetobacter isolated on culture.  1.  Polymicrobial abscess  Given concerns about her not having cleared up her soft tissue infection with problems of bleeding into the operative site, I am agreeable to having her stay on amp sulbactam at 3 g IV every 6 hours.  If she can take IV antibiotics at home we can send her home with IV antibiotics and O PAT order has been written.  We will plan on tentatively giving her an additional 3 weeks of treatment.  I will arrange follow-up in our  clinic in 2 weeks and she should follow-up closely with Dr. Lucia Gaskins  I spent greater than 35 minutes with the patient including greater than 50% of time in face to face counsel of the patient guarding her polymicrobial infection, reviewing her radiographs laboratory data microbiological data and in coordination of her care with Dr. Angelique Holm and Dr. Lucia Gaskins.  Diagnosis: Polymicrobial abscess  Culture Result: Acinetobacter Enterococcus faecalis and E. coli  Allergies  Allergen Reactions  . Dofetilide Other (See Comments)    Prolonged QT interval   . Ranolazine     Balance issues  . Rivaroxaban Other  (See Comments)    Nose Bleed X 6 hrs ; packing in ER Nasal hemorrhage  . Rivaroxaban     Nose Bleed X 6 hrs ; packing in ER Nasal hemorrhage  . Tape Other (See Comments)    SKIN IS THIN AND TEARS EASILY   . Tikosyn [Dofetilide] Other (See Comments)    "Long QT wave"   . Epinephrine Other (See Comments)    Oral anesthetic Dental form only (liquid). Patient stated she will become "out of it" she can hear you but cannot respond in normal fashion. "not fully with it"  . Epinephrine   . Ketamine Other (See Comments)    Hallucinations   . Adhesive [Tape] Rash    Paper tape as well  . Ketamine Other (See Comments)    HALLUCINATIONS    OPAT Orders Discharge antibiotics:  Unasyn 3 g IV every 8 6 hours Duration:  3 weeks End Date:  March 14, 2021  [facility] Care Per Protocol:    Labs  weekly while on IV antibiotics: _x_ CBC with differential _x_ BMP w GFR/CMP _x_ CRP _x_ ESR    __ Please pull PIC at completion of IV antibiotics x__ Please leave PIC in place until doctor has seen patient or been notified  Fax weekly labs to [phone]   Kayla Harrison has an appointment on 03/05/2021 at 330PM with Dr. Tommy Medal at   [facility] is located in the [facility] at  [address] in [place].  [address], which is located to the left of the elevators.  Phone: [phone]  Fax: [phone]  [email or web address]  She should arrive 15 to 30 minutes prior to her appointment     LOS: 8 days   Alcide Evener 02/21/2021, 12:18 PM

## 2021-02-21 NOTE — Progress Notes (Signed)
Occupational Therapy Session Note  Patient Details  Name: Kayla Harrison MRN: [id number] Date of Birth: [birth year]  Today's Date: 02/21/2021 OT Individual Time: 1400-1500 OT Individual Time Calculation (min): 60 min    Short Term Goals: Week 1:  OT Short Term Goal 1 (Week 1): STG = LTG due to ELOS  Skilled Therapeutic Interventions/Progress Updates:    Pt received in chair in room and consented to OT tx. Session focused on functional mobility with rollator, BUE and BLE strengthening, and standing tolerance with functional reach. Pt walked with rollator walker down to gym with supervision and therapist follow with IV pole. Pt instructed in 3# db exercises including elbow flexion, shoulder press, and chest press for 3x10. Pt then requested to do leg exercises, added 2# ankle weights and instructed pt in hip flexion and long arc quads for 3x15/leg for increased strength for functional mobility during ADLs and IADLs. Discussed home environment with pt, she reports she is worried about her new, larger wound VAC and that it might fall off of rollator when she goes over rougher terrain outside. Pt also has concerns about continuous IV at home, awaiting to hear back from treatment team for follow up. Pt instructed in BITs activity while standing in rollator with SUP, able to reach with R arm to sequence and touch numbers in order, pt maintained balance with LUE supported on rollator walker. After tx, OT walked with pt back up to room, pt left up in recliner with all needs met.  Therapy Documentation Precautions:  Precautions Precautions: Fall Precaution Comments: has hx of orthostatic hypotension, wound vac Restrictions Weight Bearing Restrictions: No LUE Weight Bearing: Weight bearing as tolerated LLE Weight Bearing: Weight bearing as tolerated Vital Signs: Therapy Vitals Temp: 98.3 F (36.8 C) Temp Source: Oral Pulse Rate: (!) 107 Resp: 18 BP: (!) 140/97 Patient Position (if  appropriate): Sitting Oxygen Therapy SpO2: 96 % O2 Device: Room Air Pain: none     Therapy/Group: Individual Therapy  [name] 02/21/2021, 3:48 PM

## 2021-02-21 NOTE — Progress Notes (Signed)
Physical Therapy Discharge Summary  Patient Details  Name: Kayla Harrison MRN: [id number] Date of Birth: 16-Dec-1945  Patient has met 7 of 8 long term goals due to improved activity tolerance, improved balance, improved postural control, increased strength, decreased pain, improved awareness and improved coordination. Patient to discharge at an ambulatory level Modified Independent.    Reasons goals not met: Pt did not meet car transfers goal of Mod I as pt continues to require min A due to body habitus and LE weakness.   Recommendation:  Patient will benefit from ongoing skilled PT services in home health setting to continue to advance safe functional mobility, address ongoing impairments in transfers, generalized strengthening, dynamic standing balance/coordination, gait training, endurance, and to minimize fall risk.  Equipment: No equipment provided; pt already has rollator  Reasons for discharge: treatment goals met  Patient/family agrees with progress made and goals achieved: Yes  PT Discharge Precautions/Restrictions Precautions Precautions: Fall Precaution Comments: has hx of orthostatic hypotension, wound vac Restrictions Weight Bearing Restrictions: No Cognition Overall Cognitive Status: Within Functional Limits for tasks assessed Arousal/Alertness: Awake/alert Orientation Level: Oriented X4 Memory: Appears intact Awareness: Appears intact Problem Solving: Appears intact Safety/Judgment: Appears intact Sensation Sensation Light Touch: Impaired by gross assessment Proprioception: Appears Intact Additional Comments: Pt reports altered sensation along L/R LEs Coordination Gross Motor Movements are Fluid and Coordinated: No Fine Motor Movements are Fluid and Coordinated: Yes Coordination and Movement Description: generalized weakness and deconditioning Finger Nose Finger Test: [facility] bilaterally Heel Shin Test: decreased ROM bilaterally due to swelling and  lymphedema Motor  Motor Motor: Within Functional Limits Motor - Skilled Clinical Observations: generalized weakness and deconditioning  Mobility Bed Mobility Bed Mobility: Rolling Right;Rolling Left;Sit to Supine;Supine to Sit Rolling Right: Independent with assistive device Rolling Left: Independent with assistive device Supine to Sit: Independent with assistive device Sit to Supine: Independent with assistive device Transfers Transfers: Sit to Stand;Stand to Sit;Stand Pivot Transfers Sit to Stand: Independent with assistive device Stand to Sit: Independent with assistive device Stand Pivot Transfers: Independent with assistive device Transfer (Assistive device): Rollator Locomotion  Gait Ambulation: Yes Gait Assistance: Independent with assistive device Gait Distance (Feet): 150 Feet Assistive device: Rollator Gait Gait: Yes Gait Pattern: Impaired Gait Pattern: Decreased stride length;Decreased trunk rotation;Wide base of support;Decreased step length - right;Decreased step length - left;Poor foot clearance - left;Poor foot clearance - right Gait velocity: decreased Stairs / Additional Locomotion Stairs: Yes Stairs Assistance: Contact Guard/Touching assist Stair Management Technique: Two rails Number of Stairs: 4 Height of Stairs: 6 Ramp: Supervision/Verbal cueing (rollator) Wheelchair Mobility Wheelchair Mobility: No  Trunk/Postural Assessment  Cervical Assessment Cervical Assessment: Exceptions to [facility] (forward head) Thoracic Assessment Thoracic Assessment: Exceptions to [facility] (rounded shoulders) Lumbar Assessment Lumbar Assessment: Exceptions to [facility] (posterior pelvic tilt) Postural Control Postural Control: Deficits on evaluation  Balance Balance Balance Assessed: Yes Static Sitting Balance Static Sitting - Balance Support: Feet supported;No upper extremity supported Static Sitting - Level of Assistance: 7: Independent Dynamic Sitting Balance Dynamic Sitting -  Balance Support: Feet supported;No upper extremity supported Dynamic Sitting - Level of Assistance: 7: Independent Static Standing Balance Static Standing - Balance Support: Bilateral upper extremity supported (rollator) Static Standing - Level of Assistance: 6: Modified independent (Device/Increase time) Dynamic Standing Balance Dynamic Standing - Balance Support: Bilateral upper extremity supported (rollator) Dynamic Standing - Level of Assistance: 6: Modified independent (Device/Increase time) Extremity Assessment  RLE Assessment RLE Assessment: Exceptions to [facility] General Strength Comments: grossly generalized to 4/5 LLE Assessment LLE Assessment:  Exceptions to [place] Strength Comments: grossly generalized to [place] PT, DPT  02/21/2021, 7:33 AM

## 2021-02-21 NOTE — Progress Notes (Signed)
Patient ID: Kayla Harrison, female   DOB: [birth year], 75 y.o.   MRN: [id number]   OLINA MELFI has an appointment on 03/05/2021 at 330PM with Dr. Tommy Medal at   [facility] is located in the [facility] at  [place] in [place].  [address], which is located to the left of the elevators.  Phone: [phone]  Fax: [phone]

## 2021-02-21 NOTE — Progress Notes (Signed)
     Kayla Harrison is a 75 y.o. female   Orthopaedic diagnosis:status post left thigh hematoma/abscess with wound dehiscence, I&D and revision closure VAC placement  Subjective: Was called to the bedside due to wound VAC dysfunction.  Patient states the wound VAC ceased and she had some drainage under the wound VAC dressing.  She denies increasing pain.  She is scheduled to be discharged tomorrow.  He has concern about VAC dressing changes at home.  Objectyive: Vitals:   02/21/21 0928 02/21/21 1236  BP: (!) 143/105 (!) 142/94  Pulse: (!) 109 (!) 112  Resp: 18 18  Temp: 98.4 F (36.9 C) 98.4 F (36.9 C)  SpO2: 97% 95%     Exam: Awake and alert Respirations even and unlabored No acute distress  Left thigh demonstrates dysfunction of the Prevena wound VAC. There is some bleeding below the VAC dressing and the seal is no longer.VAC dressing was removed.  No gross hematoma within the wound.  Wound edges are beefy red.  Cavity remains centrally.  The wound was milked.  No hematoma expressed.  Assessment: Left thigh I&D of hematoma and abscess with wound VAC placement.  The prevena wound VAC apparatus is no longer functioning.    Plan: I changed the wound VAC.  I ordered an inpatient wound VAC and installed this today.  She will need 2 times a week wound VAC changes at home.  I am concerned that the prevena will not be sufficient if the apparatus does not continue to be functional after 1 week.  I am hopeful that home health care for wound VAC changes can be established for twice a week and we will monitor her progression in the outpatient setting.  I did discuss with infectious disease about continuation of her antibiotics and it sounds like they will place orders for that.  Currently her wound dimensions are 2 x 2 cm area of skin loss with underlying deep cavity approximately 2 x 4 x 10 cm  Plan is for wound VAC therapy until the cavity is no longer present and then  transition to dressing changes.   Radene Journey, MD

## 2021-02-21 NOTE — Progress Notes (Addendum)
Patient ID: Kayla Harrison, female   DOB: Jan 31, 1946, 75 y.o.   MRN: [id number]   SW informed of pt continuation of wound Vac at discharge. Additionally pt will remain on IV ABX q6hrs through 6/22.  Sw spoke to patient to addressed who would be able to assist patient at home with care. Sw shared resources for SNF and HC. Patient not interested in SNF and not agreeable to nursing care.  Pt has nursing group from church currently in room. Both friends have volunteered to have themselves and their nursing church group assist with pt care needs at home potentially a few times a week.   Friend would like to attend a IV ABX teaching on tomorrow 6/2 with Carolynn Sayers at 11:00 AM.   SW ordered wound VAC. [employer] will have it in by tonight and delivered by tomorrow  Nanine Means [facility] has agreed to 3x a week RN care  Erlene Quan, [place]

## 2021-02-21 NOTE — Progress Notes (Signed)
PHARMACY CONSULT NOTE FOR:  OUTPATIENT  PARENTERAL ANTIBIOTIC THERAPY (OPAT)  Indication: Left thigh abscess   Regimen: Unasyn 3 gm IV Q 6 hours  End date: 03/14/21  IV antibiotic discharge orders are pended. To discharging provider:  please sign these orders via discharge navigator,  Select New Orders & click on the button choice - Manage This Unsigned Work.     Thank you for allowing pharmacy to be a part of this patient's care.  Jimmy Footman, PharmD, BCPS, BCIDP Infectious Diseases Clinical Pharmacist Phone: [phone] 02/21/2021, 10:55 AM

## 2021-02-21 NOTE — Progress Notes (Signed)
Physical Therapy Session Note  Patient Details  Name: Kayla Harrison MRN: [id number] Date of Birth: 1946/07/22  Today's Date: 02/21/2021 PT Individual Time: 1300-1356 PT Individual Time Calculation (min): 56 min   Short Term Goals: Week 1:  PT Short Term Goal 1 (Week 1): STG=LTG due to LOS  Skilled Therapeutic Interventions/Progress Updates:   Received pt supine in bed. Pt agreeable to PT session and reported that the surgeon just replaced wound vac 5 minutes prior to therapist's entry and reported pain along entry point of wound vac. RN notified and present to connect IV for additional fluids. Pt reported feeling "disappointed" with entire situation today having original wound vac malfunction and leaking blood everywhere and also with the miscommunication between various doctors regarding medications, IV management, and the wound vac for discharge tomorrow. Therapist reached out to treatment team to clarify D/C plan for tomorrow; awaiting response; scheduling made aware. Pt then reported urge to urinate and transferred supine<>sitting EOB mod I and donned shoes sitting EOB with max A for time management purposes. Pt ambulated 41ft with rollator mod I to bathroom with total A to manage IV pole and wound vac. Pt able to void and perform peri-care with mod I. Pt then ambulated additional 37ft with rollator and supervision (mod I first 137ft) to [employer]. Pt required 1 extensive seated rest break on rollator then ambulated additional 172ft mod I with rollator back to room. Concluded session with pt sitting in recliner awaiting OT session with NT present changing linens.   Therapy Documentation Precautions:  Precautions Precautions: Fall Precaution Comments: has hx of orthostatic hypotension, wound vac Restrictions Weight Bearing Restrictions: No LUE Weight Bearing: Weight bearing as tolerated LLE Weight Bearing: Weight bearing as tolerated  Therapy/Group: Individual Therapy Alfonse Alpers PT, DPT   02/21/2021, 12:20 PM

## 2021-02-21 NOTE — Progress Notes (Signed)
Physical Therapy Session Note  Patient Details  Name: Kayla Harrison MRN: [id number] Date of Birth: August 20, 1946  Today's Date: 02/21/2021 PT Missed Time: 63 Minutes Missed Time Reason: Other (Comment)drain malfunction  Short Term Goals: Week 1:  PT Short Term Goal 1 (Week 1): STG=LTG due to LOS  Skilled Therapeutic Interventions/Progress Updates:    Patient received supine in bed, comfortable and denying pain, but reports that her drain has stopped functioning and that she is bleeding on the bed. PT able to observe multiple areas of blood throughout bed. RN and MD aware. RN reporting that surgeon has orders in place that RN cannot adjust pump for therapy session. Patient remaining in bed so as to not contribute to further blood loss. Bed alarm on, call light within reach, RN aware.   Therapy Documentation Precautions:  Precautions Precautions: Fall Precaution Comments: has hx of orthostatic hypotension, wound vac Restrictions Weight Bearing Restrictions: No LUE Weight Bearing: Weight bearing as tolerated LLE Weight Bearing: Weight bearing as tolerated    Therapy/Group: Individual Therapy  Karoline Caldwell, PT, DPT, CBIS  02/21/2021, 7:42 AM

## 2021-02-21 NOTE — Progress Notes (Signed)
Covering PA was consulted regarding PICC line removal to decrease risks of central line infection. Patient is pending discharge tomorrow. Was informed PICC will be removed tomorrow.

## 2021-02-21 NOTE — Progress Notes (Signed)
PROGRESS NOTE   Subjective/Complaints: Appreciate ID and Ortho note freq urination last noc, 200-340m each void no burning, legs still swollen Pt states she would prefer not to go home on IV abx since she has limited assistance  Pt also notes that wound vac is not making noise since this am, was changed yesterday , some blood leaked around dressing and RN had to reinforce  ROS: Patient denies CP, SOB, N/V/D, dizziness   Objective:   No results found. Recent Labs    02/19/21 1739  WBC 7.5  HGB 8.6*  HCT 28.3*  PLT 270   Recent Labs    02/19/21 1739  NA 137  K 3.4*  CL 103  CO2 28  GLUCOSE 108*  BUN 13  CREATININE 1.07*  CALCIUM 8.1*    Intake/Output Summary (Last 24 hours) at 02/21/2021 0801 Last data filed at 02/21/2021 0126 Gross per 24 hour  Intake 720 ml  Output 250 ml  Net 470 ml        Physical Exam: Vital Signs Blood pressure (!) 145/102, pulse (!) 110, temperature 97.8 F (36.6 C), temperature source Oral, resp. rate 18, SpO2 96 %.  General: No acute distress Mood and affect are appropriate Heart: Regular rate and rhythm no rubs murmurs or extra sounds Lungs: Clear to auscultation, breathing unlabored, no rales or wheezes Abdomen: Positive bowel sounds, soft nontender to palpation, nondistended Extremities: No clubbing, cyanosis, or edema Skin: No evidence of breakdown, no evidence of rash  Skin: left thigh wound  with vac. Neurologic: Cranial nerves II through XII intact, motor strength is 5/5 in bilateral deltoid, bicep, tricep, grip, hip flexor, 4- RIght and 3- left knee extensors, ankle dorsiflexor and plantar flexor Sensory exam normal sensation to light touch  in bilateral upper and lower extremities  Musculoskeletal: left thigh with swelling      Assessment/Plan: 1. Functional deficits which require 3+ hours per day of interdisciplinary therapy in a comprehensive inpatient rehab  setting.  Physiatrist is providing close team supervision and 24 hour management of active medical problems listed below.  Physiatrist and rehab team continue to assess barriers to discharge/monitor patient progress toward functional and medical goals  Care Tool:  Bathing    Body parts bathed by patient: Right arm,Chest,Left arm,Abdomen,Front perineal area,Right upper leg,Left upper leg,Right lower leg,Left lower leg,Face,Buttocks         Bathing assist Assist Level: Contact Guard/Touching assist     Upper Body Dressing/Undressing Upper body dressing   What is the patient wearing?: Hospital gown only    Upper body assist Assist Level: Minimal Assistance - Patient > 75%    Lower Body Dressing/Undressing Lower body dressing      What is the patient wearing?: Pants,Underwear/pull up     Lower body assist Assist for lower body dressing: Minimal Assistance - Patient > 75%     Toileting Toileting    Toileting assist Assist for toileting: Contact Guard/Touching assist     Transfers Chair/bed transfer  Transfers assist     Chair/bed transfer assist level: Supervision/Verbal cueing Chair/bed transfer assistive device: Other (rollator)   Locomotion Ambulation   Ambulation assist      Assist  level: Supervision/Verbal cueing Assistive device: Rollator Max distance: 163f   Walk 10 feet activity   Assist     Assist level: Supervision/Verbal cueing Assistive device: Rollator   Walk 50 feet activity   Assist Walk 50 feet with 2 turns activity did not occur: Safety/medical concerns (fatigue, weakness, decreased balance)  Assist level: Supervision/Verbal cueing Assistive device: Rollator    Walk 150 feet activity   Assist Walk 150 feet activity did not occur: Safety/medical concerns (fatigue, weakness, decreased balance)  Assist level: Supervision/Verbal cueing Assistive device: Rollator    Walk 10 feet on uneven surface  activity   Assist  Walk 10 feet on uneven surfaces activity did not occur: Safety/medical concerns (fatigue, weakness, decreased balance)   Assist level: Supervision/Verbal cueing Assistive device: Rollator   Wheelchair     Assist Will patient use wheelchair at discharge?: No             Wheelchair 50 feet with 2 turns activity    Assist            Wheelchair 150 feet activity     Assist          Blood pressure (!) 145/102, pulse (!) 110, temperature 97.8 F (36.6 C), temperature source Oral, resp. rate 18, SpO2 96 %.    Medical Problem List and Plan: 1.Debilitysecondary to infected thigh wound hematoma. Status post I&D 02/06/2021 with a wound VAC and drain placement -patient maynot yetshower -ELOS/Goals:plan d/c tomorrow  2. Antithrombotics: -DVT/anticoagulation:SCDs. Chronic anticoagulation currently on hold -antiplatelet therapy: N/A 3. Post-operative paint:Hydrocodone as needed- continue 4. Mood:Provide emotional support -antipsychotic agents: N/A 5. Neuropsych: This patientiscapable of making decisions on herown behalf. 6. Skin/Wound Care:wound vac not functioning ortho plans to come by at ~noon,  -edema control with elevation. Pt doesn't want TEDS or ACE wraps 7. Fluids/Electrolytes/Nutrition:Routine in and outs with follow-up chemistries 8. Acute blood loss anemia. Discussed Hgb 8.6, repeat Monday.  9. Atrial fibrillation/pacemaker. Continue Amiodarone 200 mg daily. Cardiac rate is some whatcontrolled. Eliquis on hold due to hematoma -pt continues to go in and out of atrial fib. Cardiology following. Will alert them to plan for d/c thursday 10. Orthostasis. Florinef 0.05 mg twice daily, ProAmatine 5 mg twice daily. Monitor with increased mobility  -wean midodrine as per cardiology 11. ID. E. Coli/E Faecalis.  Abs d/ced 5/31- ortho note appreciated, rec abx while vac is in , ?  If oral agent an option but defer to ID on this Remove PICC in am if no further IV abx planned  -labs reviewed, CRP down to 0.8 and ESR to 36.  12. Hypothyroidism. Synthroid 13. NHL with SVC syndrome/lung cancer with radiation therapy as well as chemo. Follow-up Dr. GLindi Adie14. Obesity. Status post lap band surgery. BMI 33.64. Dietary follow-up 15. Hyperlipidemia. Crestor  16.  Vascular access- PICC placed for IV abx. May d/c prior to discharge.  17. Constipation: improved with senna 2 tabs HS 18. Insomnia: .due to frequent urination from  IV fluids- being d/ced 5/32- continue temazepam.  19. Disposition: d/c home Thursday 20. Mild hypo K likely from diuresis- will supplement today  >35 minutes spent discussing issues with pt as well as ortho, also reached out to ID.  LOS: 8 days A FACE TO FACE EVALUATION WAS PERFORMED  ACharlett Blake6/09/2020, 8:01 AM

## 2021-02-21 NOTE — Consult Note (Signed)
ELECTROPHYSIOLOGY CONSULT NOTE    Patient ID: Kayla Harrison MRN: [id number], DOB/AGE: Oct 28, 1945 75 y.o.  Admit date: 02/13/2021 Date of Consult: 02/21/2021  Primary Physician: Adrian Prince, MD Primary Cardiologist: Sherryl Manges, MD  Electrophysiologist: Dr. Graciela Husbands  Referring Provider: Dr. Wynn Banker  Patient Profile: Kayla Harrison is a 75 y.o. female with with history of thyroid cancer, hypothyroidism, NHL/SVC syndrome /lung cancer with radiation therapy as well as chemotherapy 2018 followed by Dr. Pamelia Hoit, pacemaker placement for atrial fibrillation maintained on Eliquis followed by Dr. Graciela Husbands, obesity status post lap band surgery, hypertension, hyperlipidemia, obesity with BMI 33.64, recurrent falls.    Patient well known to EP team from recent admission and chronic follow up.   Recent had admission to CIR 12/22/2020 to 01/09/2021 for functional deficits after left coronoid process fracture and left hip labral hamstring tendon tear with associated large hematoma requiring I&D and wound VAC.   She returned to the ER 01/10/2021 for open wound to left thigh that were bleeding from prior hematoma I&D site without active bleeding at time of visit, and stable Hgb. She was discharged back to home.   Presented 02/05/2021 with purulent malodorous drainage x1 week from left hip wound dehiscence. Labs on admission relatively stable aside from glucose 103, INR 1.6, lactic acid 1.1, blood cultures no growth to date, hemoglobin 11.2, WBC 8000.    Patient underwent I&D of L thigh deep abscess/hematoma excisional debridement of necrotic skin subcutaneous tissue muscle and fascia with revision of closure/drain placement 02/06/2021 per Dr. Dub Mikes with Wound VAC placement Wound cultures after I&D E. coli plus E Faecalis and treated with > 2 weeks of ABX with ID assistance. It was noted to the central part of her wound was a bit dehisced no longer held tightly with suture material consideration  being made for revision with washout.   Her chronic anticoagulation with Eliquis for atrial fibrillation has been on hold. She does continue on amiodarone at this time.  Close monitoring of blood pressure remaining on ProAmatine as well as Florinef. She was admitted back to CIR 5/24 for continued rehabilitation.   She is being seen today for the evaluation of persistent AF and orthostatic hypotension with supine hypERtension at the request of Dr. Wynn Banker and CIR.  Current plans are for discharge tomorrow, 02/22/2021. EP asked to see for final recommendations given her orthostatic hypotension -> supine HTN and persistent AF currently off OAC.   Past Medical History:  Diagnosis Date   Anemia    Arthritis    osteoarthritis - knees and right shoulder   Blood transfusion without reported diagnosis    Breast cancer (HCC)    Dr Jamey Ripa, total thyroidectomy- 1999- for cancer   Brucellosis 1964   Chronic bilateral pleural effusions    Colon polyp    Dr Kinnie Scales   Complication of anesthesia    Ketamine produces LSD reaction, bright colored nightmarish experience    Dyslipidemia    Endometriosis    Fibroids    H/O pleural effusion    s/p thoracentesis w withdrawn   Hepatitis    [place] ?hepatitis    History of dysphagia    due to radiation therapy   History of hiatal hernia    small noted on PET scan   Hypertension    Hypothyroidism    Lung cancer, lower lobe (HCC) 01/2017   radiation RX completed 03/04/17; will start chemo 6/27, pt unaware of lung cancer  Morbid obesity ([place])    Status post lap band surgery   Nephrolithiasis    Non Hodgkin's lymphoma ([place])    on chemotherapy   Persistent atrial fibrillation ([place])    a. s/p PVI 2008 b. s/p convergent ablation 1610 complicated by bradycardia requiring pacemaker implant   Personal history of radiation therapy    Presence of permanent cardiac pacemaker    Rotator cuff tear    Right   Stroke  ([place])    2003- [place] x2   SVC syndrome    with lung mass and non hodgkins lymphoma   Thyroid cancer ([place]) 2000     Surgical History:  Past Surgical History:  Procedure Laterality Date   ABDOMINAL HYSTERECTOMY  1983   afib ablation     a. 2008 PVI b. 2014 convergent ablation   APPENDECTOMY     BONE MARROW BIOPSY  02/21/2017   BREAST LUMPECTOMY Left 2010   [place]     2015- negative   CARDIOVERSION  10/09/2012   Procedure: CARDIOVERSION;  Surgeon: Minus Breeding, MD;  Location: [place];  Service: Cardiovascular;  Laterality: N/A;   CARDIOVERSION  10/09/2012   Procedure: CARDIOVERSION;  Surgeon: Minus Breeding, MD;  Location: [facility] ENDOSCOPY;  Service: Cardiovascular;  Laterality: N/A;  [name] gave the ok to add pt to the add on , but we must check to find out if the can add pt on at [phone])   CARDIOVERSION N/A 11/20/2012   Procedure: CARDIOVERSION;  Surgeon: Fay Records, MD;  Location: [place];  Service: Cardiovascular;  Laterality: N/A;   CARDIOVERSION N/A 07/18/2017   Procedure: CARDIOVERSION;  Surgeon: Thayer Headings, MD;  Location: [place];  Service: Cardiovascular;  Laterality: N/A;   CARDIOVERSION N/A 10/03/2017   Procedure: CARDIOVERSION;  Surgeon: Sanda Klein, MD;  Location: [place];  Service: Cardiovascular;  Laterality: N/A;   CARDIOVERSION N/A 01/07/2018   Procedure: CARDIOVERSION;  Surgeon: Nahser, Wonda Cheng, MD;  Location: [facility] ENDOSCOPY;  Service: Cardiovascular;  Laterality: N/A;   CARDIOVERSION N/A 12/10/2019   Procedure: CARDIOVERSION;  Surgeon: Buford Dresser, MD;  Location: [place];  Service: Cardiovascular;  Laterality: N/A;   CHOLECYSTECTOMY     COLONOSCOPY W/ POLYPECTOMY     Dr Earlean Shawl   CYSTOSCOPY N/A 02/06/2015   Procedure: CYSTOSCOPY;  Surgeon: Kathie Rhodes, MD;  Location: [facility];  Service: Urology;  Laterality: N/A;   CYSTOSCOPY W/ RETROGRADES Left 11/17/2017   Procedure: CYSTOSCOPY WITH RETROGRADE  /PYELOGRAM/;  Surgeon: Kathie Rhodes, MD;  Location: [facility];  Service: Urology;  Laterality: Left;   CYSTOSCOPY WITH RETROGRADE PYELOGRAM, URETEROSCOPY AND STENT PLACEMENT Right 02/06/2015   Procedure: RETROGRADE PYELOGRAM, RIGHT URETEROSCOPY STENT PLACEMENT;  Surgeon: Kathie Rhodes, MD;  Location: [facility];  Service: Urology;  Laterality: Right;   CYSTOSCOPY WITH RETROGRADE PYELOGRAM, URETEROSCOPY AND STENT PLACEMENT Right 03/07/2017   Procedure: CYSTOSCOPY WITH RIGHT RETROGRADE PYELOGRAM,RIGHT  URETEROSCOPYLASER LITHOTRIPSY  AND STENT PLACEMENT AND STONE BASKETRY;  Surgeon: Kathie Rhodes, MD;  Location: [place];  Service: Urology;  Laterality: Right;   EYE SURGERY     cataract surgery   fatty mass removal  1999   pubic area   HOLMIUM LASER APPLICATION N/A 12/30/8117   Procedure: HOLMIUM LASER APPLICATION;  Surgeon: Kathie Rhodes, MD;  Location: [facility];  Service: Urology;  Laterality: N/A;   HOLMIUM LASER APPLICATION Right 1/47/8295   Procedure: HOLMIUM LASER APPLICATION;  Surgeon: Kathie Rhodes, MD;  Location: [facility];  Service: Urology;  Laterality: Right;   HOLMIUM LASER APPLICATION Left 12/30/8117   Procedure: HOLMIUM LASER APPLICATION;  Surgeon: Kathie Rhodes, MD;  Location: [facility];  Service: Urology;  Laterality: Left;   I & D EXTREMITY Left 12/19/2020   Procedure: IRRIGATION AND DEBRIDEMENT OF LEFT HIP HEMATOMA WITH APPLICATION OF WOUND VAC;  Surgeon: Erle Crocker, MD;  Location: [place];  Service: Orthopedics;  Laterality: Left;   I & D EXTREMITY Left 02/06/2021   Procedure: IRRIGATION AND DEBRIDEMENT DEEP ABCESS LEFT THIGH, SECONDARY CLOSURE OF WOUND DEHISCENCE;  Surgeon: Erle Crocker, MD;  Location: [place];  Service: Orthopedics;  Laterality: Left;   IR FLUORO GUIDE PORT INSERTION RIGHT  02/24/2017   IR NEPHROSTOMY PLACEMENT RIGHT  11/17/2017   IR PATIENT EVAL TECH 0-60 MINS  03/11/2017   IR REMOVAL TUN ACCESS W/ PORT W/O FL MOD SED  04/20/2018    IR US GUIDE VASC ACCESS RIGHT  02/24/2017   KNEE ARTHROSCOPY     bilateral   LAPAROSCOPIC GASTRIC BANDING  07/10/2010   LAPAROSCOPIC GASTRIC BANDING     Laparoscopic adjustable banding APS System with posterior hiatal hernia, 2 suture.   LAPAROTOMY     for ruptured ovary and ovarian artery    NEPHROLITHOTOMY Right 11/17/2017   Procedure: NEPHROLITHOTOMY PERCUTANEOUS;  Surgeon: Kathie Rhodes, MD;  Location[facility];  Service: Urology;  Laterality: Right;   PACEMAKER INSERTION  03/10/2013   MDT dual chamber PPM   POCKET REVISION N/A 12/08/2013   Procedure: POCKET REVISION;  Surgeon: Deboraha Sprang, MD;  Location: [facility] CATH LAB;  Service: Cardiovascular;  Laterality: N/A;   PORTA CATH INSERTION     REVERSE SHOULDER ARTHROPLASTY Right 05/14/2018   Procedure: RIGHT REVERSE SHOULDER ARTHROPLASTY;  Surgeon: Tania Ade, MD;  Location: [place];  Service: Orthopedics;  Laterality: Right;   REVERSE SHOULDER REPLACEMENT Right 05/14/2018   RIGHT HEART CATH N/A 07/21/2019   Procedure: RIGHT HEART CATH;  Surgeon: Larey Dresser, MD;  Location: [place] CV LAB;  Service: Cardiovascular;  Laterality: N/A;   TEE WITH CARDIOVERSION  09/22/2017   TEE WITHOUT CARDIOVERSION N/A 10/03/2017   Procedure: TRANSESOPHAGEAL ECHOCARDIOGRAM (TEE);  Surgeon: Sanda Klein, MD;  Location: [facility] ENDOSCOPY;  Service: Cardiovascular;  Laterality: N/A;   TEE WITHOUT CARDIOVERSION N/A 08/04/2019   Procedure: TRANSESOPHAGEAL ECHOCARDIOGRAM (TEE);  Surgeon: Larey Dresser, MD;  Location: [facility] ENDOSCOPY;  Service: Cardiovascular;  Laterality: N/A;   TEE WITHOUT CARDIOVERSION N/A 12/10/2019   Procedure: TRANSESOPHAGEAL ECHOCARDIOGRAM (TEE);  Surgeon: Buford Dresser, MD;  Location: ;  Service: Cardiovascular;  Laterality: N/A;   THYROIDECTOMY  1998   Dr Margot Chimes   TONSILLECTOMY     TOTAL KNEE ARTHROPLASTY  04/13/2012   Procedure: TOTAL KNEE ARTHROPLASTY;  Surgeon: Rudean Haskell, MD;  Location: [place];  Service:  Orthopedics;  Laterality: Right;   VIDEO BRONCHOSCOPY WITH ENDOBRONCHIAL ULTRASOUND N/A 02/07/2017   Procedure: VIDEO BRONCHOSCOPY WITH ENDOBRONCHIAL ULTRASOUND;  Surgeon: Marshell Garfinkel, MD;  Location: [place];  Service: Pulmonary;  Laterality: N/A;     Medications Prior to Admission  Medication Sig Dispense Refill Last Dose   acetaminophen (TYLENOL) 325 MG tablet Take 2 tablets (650 mg total) by mouth every 4 (four) hours as needed for mild pain.      ferrous sulfate 325 (65 FE) MG tablet Take 1 tablet (325 mg total) by mouth daily with breakfast. 30 tablet 3    fludrocortisone (FLORINEF) 0.1 MG tablet Take 0.5 tablets (0.05  mg total) by mouth 2 (two) times daily with a meal. 30 tablet 0    furosemide (LASIX) 20 MG tablet Take 20 mg by mouth daily as needed for fluid or edema.       levothyroxine (SYNTHROID) 137 MCG tablet Take 68.5-137 mcg by mouth See admin instructions. Taking 137 mcg tablet daily except on Saturday taking 1/2 tablet      Lifitegrast 5 % SOLN Place 1 drop into both eyes at bedtime.       midodrine (PROAMATINE) 5 MG tablet Take 1 tablet (5 mg total) by mouth 2 (two) times daily with a meal. 60 tablet 0    nitrofurantoin, macrocrystal-monohydrate, (MACROBID) 100 MG capsule Take 100 mg by mouth 2 (two) times daily.      rosuvastatin (CRESTOR) 10 MG tablet Take 10 mg by mouth 2 (two) times a week. Takes on Monday and Thursday       Inpatient Medications:   amiodarone  200 mg Oral Daily   Chlorhexidine Gluconate Cloth  6 each Topical BID   docusate sodium  100 mg Oral BID   fludrocortisone  0.05 mg Oral BID WC   levothyroxine  137 mcg Oral Once per day on Sun Mon Tue Wed Thu Fri   levothyroxine  68.5 mcg Oral Once per day on Sat   midodrine  5 mg Oral Daily   polyethylene glycol  17 g Oral BID   potassium chloride  20 mEq Oral BID   rosuvastatin  10 mg Oral Once per day on Mon Thu   senna  2 tablet Oral QHS    Allergies:  Allergies  Allergen Reactions   Dofetilide  Other (See Comments)    Prolonged QT interval    Ranolazine     Balance issues   Rivaroxaban Other (See Comments)    Nose Bleed X 6 hrs ; packing in ER Nasal hemorrhage   Rivaroxaban     Nose Bleed X 6 hrs ; packing in ER Nasal hemorrhage   Tape Other (See Comments)    SKIN IS THIN AND TEARS EASILY    Tikosyn [Dofetilide] Other (See Comments)    "Long QT wave"    Epinephrine Other (See Comments)    Oral anesthetic Dental form only (liquid). Patient stated she will become "out of it" she can hear you but cannot respond in normal fashion. "not fully with it"   Epinephrine    Ketamine Other (See Comments)    Hallucinations    Adhesive [Tape] Rash    Paper tape as well   Ketamine Other (See Comments)    HALLUCINATIONS    Social History   Socioeconomic History   Marital status: Single    Spouse name: Not on file   Number of children: 0   Years of education: Not on file   Highest education level: Master's degree (e.g., MA, MS, MEng, MEd, MSW, MBA)  Occupational History   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    Employer: RETIRED  Tobacco Use   Smoking status: Never Smoker   Smokeless tobacco: Never Used  Vaping Use   Vaping Use: Never used  Substance and Sexual Activity   Alcohol use: No    Comment: none since 1990   Drug use: Never   Sexual activity: Not Currently    Birth control/protection: Surgical  Other Topics Concern   Not on file  Social History Narrative   ** Merged History Encounter **       SINGLE  NO  CHILDREN NEVER SMOKED EXERCISE 3 X WK RETIRED NO CAFFEINE      Social Determinants of Health   Financial Resource Strain: Not on file  Food Insecurity: Not on file  Transportation Needs: Not on file  Physical Activity: Not on file  Stress: Not on file  Social Connections: Not on file  Intimate Partner Violence: Not on file     Family History  Problem Relation Age of Onset   Heart disease Father 50       MI @autopsy     Colon cancer Father        COLON   Heart attack Father    Other Mother        temporal arteritis    Diabetes Sister    Diabetes Brother    Diabetes Paternal Aunt    Diabetes Paternal Grandmother    Esophageal cancer Neg Hx    Inflammatory bowel disease Neg Hx    Liver disease Neg Hx    Pancreatic cancer Neg Hx    Stomach cancer Neg Hx      Review of Systems: All other systems reviewed and are otherwise negative except as noted above.  Physical Exam: Vitals:   02/20/21 0412 02/20/21 1328 02/20/21 2001 02/21/21 0321  BP: 136/86 132/79 (!) 146/99 (!) 145/102  Pulse: (!) 108 (!) 106 (!) 105 (!) 110  Resp: 18 18 18 18   Temp: 98.6 F (37 C) (!) 97.4 F (36.3 C) 98.6 F (37 C) 97.8 F (36.6 C)  TempSrc: Oral Oral Oral Oral  SpO2: 95% 98% 97% 96%    GEN- The patient is well appearing, alert and oriented x 3 today.   HEENT: normocephalic, atraumatic; sclera clear, conjunctiva pink; hearing intact; oropharynx clear; neck supple Lungs- Clear to ausculation bilaterally, normal work of breathing.  No wheezes, rales, rhonchi Heart- Regular rate and rhythm, no murmurs, rubs or gallops GI- soft, non-tender, non-distended, bowel sounds present Extremities- no clubbing, cyanosis, or edema; DP/PT/radial pulses 2+ bilaterally MS- no significant deformity or atrophy Skin- warm and dry, no rash or lesion Psych- euthymic mood, full affect Neuro- strength and sensation are intact  Labs:   Lab Results  Component Value Date   WBC 7.5 02/19/2021   HGB 8.6 (L) 02/19/2021   HCT 28.3 (L) 02/19/2021   MCV 97.9 02/19/2021   PLT 270 02/19/2021    Recent Labs  Lab 02/19/21 1739  NA 137  K 3.4*  CL 103  CO2 28  BUN 13  CREATININE 1.07*  CALCIUM 8.1*  GLUCOSE 108*      Radiology/Studies: IR PICC PLACEMENT RIGHT >5 YRS INC IMG GUIDE  Result Date: 02/14/2021 INDICATION: 75 year old female with a history of infection referred for PICC EXAM: IMAGE GUIDED PLACEMENT OF RIGHT UPPER  EXTREMITY PICC MEDICATIONS: None ANESTHESIA/SEDATION: None FLUOROSCOPY TIME:  Fluoroscopy Time: 0 minutes 6 seconds (1 mGy). COMPLICATIONS: None PROCEDURE: Informed written consent was obtained from the patient after a thorough discussion of the procedural risks, benefits and alternatives. All questions were addressed. Maximal Sterile Barrier Technique was utilized including caps, mask, sterile gowns, sterile gloves, sterile drape, hand hygiene and skin antiseptic. A timeout was performed prior to the initiation of the procedure. Patient was position in the supine position on the fluoroscopy table with the right arm abducted 90 degrees. Ultrasound survey of the upper extremity was performed with images stored and sent to PACs. The right brachial vein was selected for access. Dual/parallel brachial veins. Once the patient was prepped and draped in the  usual sterile fashion, the skin and subcutaneous tissues were generously infiltrated with 1% lidocaine for local anesthesia. A micropuncture access kit was then used to access the targeted vein. Wire was passed centrally, confirmed to be within the venous system under fluoroscopy. A small stab incision was made with an 11 blade scalpel and the sheath was then placed over the wire. Estimated length of the catheter was then performed with the indwelling wire. Catheter was amputated at 37 cm length and placed with coaxial wire through the peel-away. Double lumen, power injectable PICC in the right brachial vein. Tip confirmed at the cavoatrial junction, and the catheter is ready for use. Stat lock was placed. Patient tolerated the procedure well and remained hemodynamically stable throughout. No complications were encountered and no significant blood loss was encountered. IMPRESSION: Status post right upper extremity PICC. Signed, Dulcy Fanny. Dellia Nims, RPVI [facility] Radiology Electronically Signed   By: Corrie Mckusick D.O.    On: 02/14/2021 17:04    EKG: 02/14/21 shows AF at 100 bpm (personally reviewed)  TELEMETRY: Not connected in rehab  Assessment/Plan:  Persistent atrial fibrillation Amiodarone therapy - longstanding SND s/p PPM  Remains in Atrial fibrillation and likely poor candidate for sinus, at least for the time being with chronic anticoagulation on hold.  Rates overall well controlled on amiodarone BB/CCB use has been precluded by orthostatic hypotension requiring florinef and midodrine  Thigh hematoma -> infection -> I&D 02/06/21 with wound VAC and drain placement Component of lymphedema She remains unable to shower yet with wound vac Initially had planned to be finished with ABX per ID note 02/20/21, now plan on continuing Unasyn via IV for at least 3 more weeks. She is concerned about her ability to personally pack the wound once wound vac d/c'd.  Pt declines TED hose or UNNA boots  Orthostatic hypotension No recent orthostatic checks. Orders placed.  She has been managed on florinef 0.05 mg BID and midodrine 5 mg BID Midodrine now decreased to 5 mg daily with supine HTN up into 170-180 range at times. Dr. Caryl Comes has seen and we will discontinue both of these for now.   Anemia Hypothroidism NHL with SVC syndrome/lung cancer s/p radiation and chemo by Dr. Lindi Adie HLD Poor vascular access Hypokalemia, mild  - update labs  For questions or updates, please contact [facility] Please consult [email or web address] for contact info under Cardiology/STEMI.  Jacalyn Lefevre, PA-C  02/21/2021 8:20 AM

## 2021-02-22 MED ORDER — FLUCONAZOLE 150 MG PO TABS
150.0000 mg | ORAL_TABLET | Freq: Every day | ORAL | Status: AC
  Administered 2021-02-22 – 2021-02-23 (×2): 150 mg via ORAL
  Filled 2021-02-22 (×2): qty 1

## 2021-02-22 MED ORDER — HYDROCODONE-ACETAMINOPHEN 5-325 MG PO TABS: 1.0000 | ORAL_TABLET | Freq: Four times a day (QID) | ORAL | Status: DC | PRN

## 2021-02-22 NOTE — Progress Notes (Signed)
Patient ID: Kayla Harrison, female   DOB: 05-11-1946, 75 y.o.   MRN: [id number]   SW met with patient due to update of IV ABX teaching. Pt still very overwhelmed and anxious about care needs at home. Patient states she is not able to handle her IV.   SW offered pt SNF placement again for continuous 24/7 around the clock nursing care, patient declined. No family support.  Pt declined congregational program People to [employer] covering the cost of nursing care.   Pt requesting late d/c tomorrow. Sw informed pt I cannot guarantee this due to extension on today. Sw will follow up with pt, no other questions or concerns.  [place], [place]

## 2021-02-22 NOTE — Progress Notes (Signed)
Occupational Therapy Session Note  Patient Details  Name: Kayla Harrison MRN: [id number] Date of Birth: [birth year]  Today's Date: 02/22/2021 OT Individual Time: 0900-1001 OT Individual Time Calculation (min): 61 min    Short Term Goals: Week 1:  OT Short Term Goal 1 (Week 1): STG = LTG due to ELOS  Skilled Therapeutic Interventions/Progress Updates:    Pt received in room and consented to OT tx. Pt instructed in functional mobility tasks with rollator walker and wound VAC mgmt. Sister present for session as well. Pt requested to walk/ complete leg exercises this session as she had already washed up and got dressed on her own. Pt instructed in functional mobility activity with item retrieval and transport to simulate IADLs in the home and shopping in the community. Pt demo's good dynamic standing balance during functional reach outside of BOS, demo's good safety awareness and no LOB. Pt required seated rest breaks on rollator during longer periods of walking, but demo's enough activity tolerance to walk household distances. Pt requested leg exercises, completed 2x15 long arc quads and 2x10 hip flexion while seated with 2# ankle weights for increased strength for functional transfers and mobility.  After tx, pt left up in recliner with sister present and all needs met.   Therapy Documentation Precautions:  Precautions Precautions: Fall Precaution Comments: has hx of orthostatic hypotension, wound vac Restrictions Weight Bearing Restrictions: No LUE Weight Bearing: Touch down weight bearing LLE Weight Bearing: Weight bearing as tolerated  Pain: Pain Assessment Pain Scale: 0-10 Pain Score: 0-No pain   Therapy/Group: Individual Therapy  Jayvion Stefanski 02/22/2021, 10:04 AM

## 2021-02-22 NOTE — Progress Notes (Signed)
Wound vac: there does seem to be a leak, I have reinforced the dressing and pressure is reading between 100 and 125 but also is positional. Notified MD via text. Plugged in the wound vac that pt will have going home so that unit can charge over night.

## 2021-02-22 NOTE — Progress Notes (Addendum)
Patient ID: Kayla Harrison, female   DOB: 1945/11/20, 75 y.o.   MRN: [id number]   Sw informed of congregational nurses informing patient of not being able to physically assist patient with nursing needs. Patient received education 11-1:30 PM and is still very overwhelmed and struggling. SW will discuss SNF with pt again until IV ABX is d/c'd  Erlene Quan, [place]

## 2021-02-22 NOTE — Progress Notes (Addendum)
PROGRESS NOTE   Subjective/Complaints: Notes reviewed-she will require wound vac at discharge and she will require V abx until 6/22. IV abx teaching today. She will receive 3x/week RN care upon discharge.   ROS: Patient denies CP, SOB, N/V/D, dizziness   Objective:   No results found. Recent Labs    02/19/21 1739  WBC 7.5  HGB 8.6*  HCT 28.3*  PLT 270   Recent Labs    02/19/21 1739 02/21/21 1556  NA 137 138  K 3.4* 3.7  CL 103 104  CO2 28 28  GLUCOSE 108* 102*  BUN 13 10  CREATININE 1.07* 0.94  CALCIUM 8.1* 8.3*    Intake/Output Summary (Last 24 hours) at 02/22/2021 0350 Last data filed at 02/22/2021 0807 Gross per 24 hour  Intake 480 ml  Output --  Net 480 ml        Physical Exam: Vital Signs Blood pressure (!) 146/100, pulse (!) 110, temperature 97.8 F (36.6 C), temperature source Oral, resp. rate 17, SpO2 93 %.  Gen: no distress, normal appearing HEENT: oral mucosa pink and moist, NCAT Cardio: Tachycardia Chest: normal effort, normal rate of breathing Abd: soft, non-distended Ext: no edema Psych: pleasant, normal affect  Skin: left thigh wound  with vac. Neurologic: Cranial nerves II through XII intact, motor strength is 5/5 in bilateral deltoid, bicep, tricep, grip, hip flexor, 4- RIght and 3- left knee extensors, ankle dorsiflexor and plantar flexor Sensory exam normal sensation to light touch  in bilateral upper and lower extremities  Musculoskeletal: left thigh with swelling      Assessment/Plan: 1. Functional deficits which require 3+ hours per day of interdisciplinary therapy in a comprehensive inpatient rehab setting.  Physiatrist is providing close team supervision and 24 hour management of active medical problems listed below.  Physiatrist and rehab team continue to assess barriers to discharge/monitor patient progress toward functional and medical goals  Care Tool:  Bathing     Body parts bathed by patient: Right arm,Chest,Left arm,Abdomen,Front perineal area,Right upper leg,Left upper leg,Right lower leg,Left lower leg,Face,Buttocks         Bathing assist Assist Level: Contact Guard/Touching assist     Upper Body Dressing/Undressing Upper body dressing   What is the patient wearing?: Hospital gown only    Upper body assist Assist Level: Minimal Assistance - Patient > 75%    Lower Body Dressing/Undressing Lower body dressing      What is the patient wearing?: Pants,Underwear/pull up     Lower body assist Assist for lower body dressing: Minimal Assistance - Patient > 75%     Toileting Toileting    Toileting assist Assist for toileting: Contact Guard/Touching assist     Transfers Chair/bed transfer  Transfers assist     Chair/bed transfer assist level: Supervision/Verbal cueing Chair/bed transfer assistive device: Other (rollator)   Locomotion Ambulation   Ambulation assist      Assist level: Supervision/Verbal cueing Assistive device: Rollator Max distance: 1103ft   Walk 10 feet activity   Assist     Assist level: Supervision/Verbal cueing Assistive device: Rollator   Walk 50 feet activity   Assist Walk 50 feet with 2 turns activity did not  occur: Safety/medical concerns (fatigue, weakness, decreased balance)  Assist level: Supervision/Verbal cueing Assistive device: Rollator    Walk 150 feet activity   Assist Walk 150 feet activity did not occur: Safety/medical concerns (fatigue, weakness, decreased balance)  Assist level: Supervision/Verbal cueing Assistive device: Rollator    Walk 10 feet on uneven surface  activity   Assist Walk 10 feet on uneven surfaces activity did not occur: Safety/medical concerns (fatigue, weakness, decreased balance)   Assist level: Supervision/Verbal cueing Assistive device: Rollator   Wheelchair     Assist Will patient use wheelchair at discharge?: No              Wheelchair 50 feet with 2 turns activity    Assist            Wheelchair 150 feet activity     Assist          Blood pressure (!) 146/100, pulse (!) 110, temperature 97.8 F (36.6 C), temperature source Oral, resp. rate 17, SpO2 93 %.    Medical Problem List and Plan: 1.Debilitysecondary to infected thigh wound hematoma. Status post I&D 02/06/2021 with a wound VAC and drain placement -patient maynot yetshower -ELOS/Goals:plan d/c tomorrow   -Continue CIR 2. Antithrombotics: -DVT/anticoagulation:SCDs. Chronic anticoagulation currently on hold -antiplatelet therapy: N/A 3. Post-operative paint:Hydrocodone as needed- continue 4. Mood:Provide emotional support -antipsychotic agents: N/A 5. Neuropsych: This patientiscapable of making decisions on herown behalf. 6. Skin/Wound Care:continue wound vac outpatient -edema control with elevation. Pt doesn't want TEDS or ACE wraps 7. Fluids/Electrolytes/Nutrition:Routine in and outs with follow-up chemistries 8. Acute blood loss anemia. Discussed Hgb 8.6, repeat Monday.  9. Atrial fibrillation/pacemaker. Continue Amiodarone 200 mg daily. Cardiac rate is some whatcontrolled. Eliquis on hold due to hematoma -pt continues to go in and out of atrial fib. Cardiology following. Will alert them to plan for d/c thursday 10. Orthostasis. Resolved. Weaned off medications.  11. ID. E. Coli/E Faecalis.  Continue IV abx until 6/22.  -labs reviewed, CRP down to 0.8 and ESR to 36.  12. Hypothyroidism. Synthroid 13. NHL with SVC syndrome/lung cancer with radiation therapy as well as chemo. Follow-up Dr. Lindi Adie 14. Obesity. Status post lap band surgery. BMI 33.64. Dietary follow-up 15. Hyperlipidemia. Crestor  16.  Vascular access- PICC placed for IV abx. May d/c prior to discharge.  17. Constipation: improved with senna 2 tabs  HS 18. Insomnia: .due to frequent urination from  IV fluids- being d/ced 5/32- continue temazepam.  19. Disposition: d/c home Thursday 20. Mild hypo K likely from diuresis- supplemented with improvement  >35 minutes spent in discussion of IV antibiotics, wound vac blockages and strategies to ambulate with it at home, reviewing BP and medications, discussion of support and home, and how/who to contact should she experience issues at home. >50% spent in direct patient care  LOS: 9 days A FACE TO FACE EVALUATION WAS [place] 02/22/2021, 8:32 AM

## 2021-02-22 NOTE — Progress Notes (Addendum)
Ordered another wound vac and replaced prev wound vac to see if possible malfunction. So far, wound vac has not beeped and will check suction function at 1600.   IV therapy: pt having trouble closing clamp for infusions. Called IV team to see if has extra clamp for pt to practice with. Rec'd ext clamp and pt is having difficulty closing clamp with thumb and pointer finger. Had her switch thumb and pointer and caused pain in thumb. She will continue to practice.   Nurse from church stopped by for education: She is only abel to verbally help pt and unable to help physically with any task needed.    1737: wound vac currently on and functioning without leak or other malfunction at this time. Dr Lucia Gaskins is okay with CIR/WOC orders for dressing to wound wrapping around the left leg per attached text.   Thank you!! I messaged wound care too about the inner thigh dressings. I'm fine with whatever they want for those.

## 2021-02-22 NOTE — Progress Notes (Signed)
Wound vac for home in room, Pam with IV therapy and Manuela Schwartz from church for teaching of iv infusing.

## 2021-02-22 NOTE — Progress Notes (Signed)
Physical Therapy Session Note  Patient Details  Name: Kayla Harrison MRN: [id number] Date of Birth: Jan 30, 1946  Today's Date: 02/22/2021 PT Individual Time: 1100-1143 and 1500-1526 PT Individual Time Calculation (min): 43 min  and 26 min Today's Date: 02/22/2021 PT Missed Time: 17 Minutes Missed Time Reason: Other (Comment) (Teaching with IV RN)  Short Term Goals: Week 1:  PT Short Term Goal 1 (Week 1): STG=LTG due to LOS  Skilled Therapeutic Interventions/Progress Updates:   Treatment Session 1 Received pt supine in bed with PA, Pam, and 2 wound care nurses present finishing inspecting wound vac. Pt reported having meeting at [facility] with IV RN and personnel to assist in teaching pt how to manage IV at home; therefore requested to remain close to room. Pt also discouraged stating she now has a yeast infection in between her thighs and has questions regarding skin care. Pt agreeable to PT treatment, and denied any pain during session. Session with emphasis on discharge planning, functional mobility/transfers, generalized strengthening, dynamic standing balance/coordination, ambulation, and improved activity tolerance. Pt transferred supine<>sitting EOB mod I and donned shoes with max A for time management purposes. Pt transferred bed<>recliner with rollator and mod I with total A to manage IV and wound vac. Pt reported going home with current wound vac, therefore discussed strategies to manage it due to weight. Stressed importance of avoiding standing/walking with wound vac and putting it on rollator prior to standing up and setting it back on floor after sitting down to minimize risk of falls. Also discussed central placement of wound vac at home for proper amount of cord as well as charging lifespan of wound vac. Pt then ambulated 140ft x 2 trials mod I using rollator with 1 seated rest break in between each trial and 1 standing rest break. Pt requested to return to room to wait for IV RN. Upon  returning to recliner, demonstrated good understanding of wound vac management while using rollator. Concluded session with pt sitting in recliner with all needs within reach. 13 minutes missed of skilled physical therapy.   Treatment Session 2 Received pt sitting in recliner, pt agreeable to PT treatment, and denied any pain during session. Session with emphasis on functional mobility/transfers, generalized strengthening, dynamic standing balance/coordination, ambulation, toileting, and improved activity tolerance. Pt reported urge to use restroom and ambulated 47ft x 2 trials with rollator mod I to/from bathroom with total A to manage IV pole and able to void and perform peri-care independently. Pt then ambulated >174ft x 2 trials with rollator and mod I to/from [employer] with 1 seated rest break. Pt requested to return to bed and sat EOB with IV RN present at bedside flushing IV.   Therapy Documentation Precautions:  Precautions Precautions: Fall Precaution Comments: has hx of orthostatic hypotension, wound vac Restrictions Weight Bearing Restrictions: No LUE Weight Bearing: Touch down weight bearing LLE Weight Bearing: Weight bearing as tolerated  Therapy/Group: Individual Therapy Alfonse Alpers PT, DPT   02/22/2021, 7:22 AM

## 2021-02-22 NOTE — Progress Notes (Signed)
Occupational Therapy Session Note  Patient Details  Name: Kayla Harrison MRN: [id number] Date of Birth: [birth year]  Today's Date: 02/22/2021 OT Individual Time: 1350-1430 OT Individual Time Calculation (min): 40 min  and Today's Date: 02/22/2021 OT Missed Time: 20 Minutes Missed Time Reason: Nursing care;Other (comment) (RN completing PICC line training with pt)   Short Term Goals: Week 1:  OT Short Term Goal 1 (Week 1): STG = LTG due to ELOS  Skilled Therapeutic Interventions/Progress Updates:    Pt received in room and consented to OT tx. Pt had just finished up training with RN for PICC line training. Pt having trouble clamping and unclamping PICC line clips using her one hand which is her non dominant hand. Practiced multiple variations of using L hand fingers to problem solve best way for clamping and unclamping. Extensive time spent practicing, however pt only successful some of the time. Pt very anxiou sabout going home and having to manage PICC line and wound VAC on her own. Communication with RN requesting extra clips for her to practice with, RN called IV team. Pt then requested to do exercises while in room, instructed in light orange theraband exercises including elbow flexion, elbow extension, and shoulder internal/external rotation for 3x15. After tx, pt left up in chair with all needs met.   Therapy Documentation Precautions:  Precautions Precautions: Fall Precaution Comments: has hx of orthostatic hypotension, wound vac Restrictions Weight Bearing Restrictions: No LUE Weight Bearing: Touch down weight bearing LLE Weight Bearing: Weight bearing as tolerated General: General OT Amount of Missed Time: 20 Minutes PT Missed Treatment Reason: Other (Comment) (Teaching with IV RN) Vital Signs: Therapy Vitals Temp: 98.4 F (36.9 C) Temp Source: Oral Pulse Rate: (!) 109 Resp: 18 BP: (!) 128/100 Patient Position (if appropriate): Sitting Oxygen Therapy SpO2: 97 % O2  Device: Room Air Pain: none     Therapy/Group: Individual Therapy  Eretria Manternach 02/22/2021, 3:46 PM

## 2021-02-22 NOTE — Progress Notes (Addendum)
Patient with multiple concerns and RN has not been able to fix wound VAC therefore call [facility] for evaluation and input. Called Dr. Huel Cote office regarding problems with [facility] as well as concerns about left thigh wound/abrasion and recommendations by [facility].   Diflucan 150 mg X 2 doses added after secure chat w/cards.

## 2021-02-22 NOTE — Consult Note (Addendum)
[facility] Nurse Consult Note: Patient receiving care in [facility].  Reason for Consult: evaluation of Left thigh VAC dressing and machine for malfunction. Wound type: Surgical wound  PA, Reesa Chew, V. Smith-WTA, and I evaluated the left thigh NPWT dressing and the VAC machine.  The VAC machine clearly showed 0 mmHg suction to the left thigh, and per report had been malfunctioning since placement of the dressing yesterday.  PA, P. Love did not want me to change the dressing at this time, she will discuss the situation with Dr. Lucia Gaskins before further action is taken.  Because there was no suction to the VAC dressing, the machine was turned off and I notified PA P. Love of my actions.  While present the patient requested we evaluate the wound to the inner, upper thigh she states occurred after the first hematoma evacuation thigh surgery and by a tightly wrapped Ace bandage which she reports was on for 2 days.  She explains that Dr. Lucia Gaskins has told her he only wants dry gauze to the area. I recommended placement of Xeroform gauze over the open wound area ONLY, antifungal powder to the fungal involvement clearly seen in the photos from today on those areas, and coverage with a foam dressing.  The PA does not want me to order this at this time.  She will discuss this wound and treatment with Dr. Lucia Gaskins.  [facility] nurse will not follow at this time.  Please re-consult the [facility] team if needed.  Val Riles, RN, MSN, CWOCN, CNS-BC, pager [phone]

## 2021-02-22 NOTE — Progress Notes (Signed)
Physical Therapy Session Note  Patient Details  Name: Kayla Harrison MRN: [id number] Date of Birth: [birth year]  Today's Date: 02/21/2021 PT Missed Time: 30 minutes Missed Time Reason: Other (Comment) drain malfunction  Short Term Goals: Week 1:  PT Short Term Goal 1 (Week 1): STG=LTG due to LOS  Skilled Therapeutic Interventions/Progress Updates:  Inquired with RN prior to session as to pt status as this therapist informed earlier in morning that pt's surgical drain had malfunctioned and she was leaking/ bleeding from drain placement. RN related that surgeon has orders in place that RN cannot adjust pump/ drain. Surgeon has been made aware of issue and was to arrive to see pt by 1:30pm this day. On leaving unit, surgeon noted to be arriving to pt's room and donning contact precaution PPE.   Therapy Documentation Precautions:  Precautions Precautions: Fall Precaution Comments: has hx of orthostatic hypotension, wound vac Restrictions Weight Bearing Restrictions: No LUE Weight Bearing: Touch down weight bearing LLE Weight Bearing: Weight bearing as tolerated    Therapy/Group: Individual Therapy  Alger Simons PT, DPT   02/21/2021, 6:15 PM

## 2021-02-22 NOTE — Progress Notes (Signed)
Occupational Therapy Discharge Summary  Patient Details  Name: Kayla Harrison MRN: [id number] Date of Birth: 1946/08/22   Patient has met 12 of 13 long term goals due to improved activity tolerance and improved balance.  Patient to discharge at overall Modified Independent level.  Patient's care partner unavailable to provide the necessary physical assistance at discharge. Pt's sister present during final session, can provide minimal assist (bringing by meals, helping with cat, and laundry) as needed. Pt's biggest barrier is wound VAC and IV mgmt for home, RN reports she will have training on both today.   Reasons goals not met: Pt requires SUP for shower transfer for safety.  Recommendation:  Patient will benefit from ongoing skilled OT services in home health setting to continue to advance functional skills in the area of BADL and iADL.  Equipment: No equipment provided  Reasons for discharge: treatment goals met and discharge from hospital  Patient/family agrees with progress made and goals achieved: Yes  OT Discharge Precautions/Restrictions  Precautions Precautions: Fall Precaution Comments: has hx of orthostatic hypotension, wound vac Restrictions Weight Bearing Restrictions: No Vital Signs Therapy Vitals Temp: 97.8 F (36.6 C) Temp Source: Oral Pulse Rate: (!) 110 Resp: 17 BP: (!) 146/100 Patient Position (if appropriate): Lying Oxygen Therapy SpO2: 93 % O2 Device: Room Air Pain Pain Assessment Pain Scale: 0-10 Pain Score: 0-No pain ADL ADL Eating: Independent Grooming: Modified independent Where Assessed-Grooming: Sitting at sink,Standing at sink Upper Body Bathing: Modified independent Where Assessed-Upper Body Bathing: Chair Lower Body Bathing: Modified independent Where Assessed-Lower Body Bathing: Chair Upper Body Dressing: Modified independent (Device) Where Assessed-Upper Body Dressing: Chair Lower Body Dressing: Modified independent Where  Assessed-Lower Body Dressing: Chair Toileting: Modified independent Where Assessed-Toileting: Glass blower/designer: Diplomatic Services operational officer Method: Tax inspector: Not assessed Social research officer, government: Close supervision Social research officer, government Method: Heritage manager: Civil engineer, contracting with back,Grab bars Vision Baseline Vision/History: Wears glasses Wears Glasses: Reading only Patient Visual Report: No change from baseline Vision Assessment?: No apparent visual deficits Perception  Perception: Within Functional Limits Praxis Praxis: Intact Cognition Overall Cognitive Status: Within Functional Limits for tasks assessed Arousal/Alertness: Awake/alert Orientation Level: Oriented X4 Attention: Sustained;Selective Sustained Attention: Appears intact Selective Attention: Appears intact Memory: Appears intact Awareness: Appears intact Problem Solving: Appears intact Safety/Judgment: Appears intact Sensation Sensation Light Touch: Impaired by gross assessment Hot/Cold: Appears Intact Proprioception: Appears Intact Additional Comments: Pt reports altered sensation along L/R LEs Coordination Gross Motor Movements are Fluid and Coordinated: No Fine Motor Movements are Fluid and Coordinated: Yes Coordination and Movement Description: generalized weakness and deconditioning Finger Nose Finger Test: [facility] bilaterally Heel Shin Test: decreased ROM bilaterally due to swelling and lymphedema Motor  Motor Motor: Within Functional Limits Motor - Skilled Clinical Observations: generalized weakness and deconditioning Mobility  Bed Mobility Bed Mobility: Rolling Right;Rolling Left;Sit to Supine;Supine to Sit Rolling Right: Independent with assistive device Rolling Left: Independent with assistive device Supine to Sit: Independent with assistive device Sit to Supine: Independent with  assistive device Transfers Sit to Stand: Independent with assistive device Stand to Sit: Independent with assistive device  Trunk/Postural Assessment  Cervical Assessment Cervical Assessment: Exceptions to [facility] (fwd head) Thoracic Assessment Thoracic Assessment: Exceptions to [facility] (rounded shoulders) Lumbar Assessment Lumbar Assessment: Exceptions to [facility] (posterior pelvic tilt) Postural Control Postural Control: Within Functional Limits  Balance Balance Balance Assessed: Yes Static Sitting Balance Static Sitting - Balance Support: Feet supported;No upper extremity supported Static Sitting -  Level of Assistance: 7: Independent Dynamic Sitting Balance Dynamic Sitting - Balance Support: Feet supported;No upper extremity supported Dynamic Sitting - Level of Assistance: 7: Independent Dynamic Sitting - Balance Activities: Reaching for objects Static Standing Balance Static Standing - Balance Support: Bilateral upper extremity supported Static Standing - Level of Assistance: 6: Modified independent (Device/Increase time) Dynamic Standing Balance Dynamic Standing - Balance Support: Bilateral upper extremity supported Dynamic Standing - Level of Assistance: 6: Modified independent (Device/Increase time) Dynamic Standing - Balance Activities: Forward lean/weight shifting;Reaching for objects Extremity/Trunk Assessment RUE Assessment RUE Assessment: Within Functional Limits LUE Assessment LUE Assessment: Within Functional Limits   Boone Gear 02/22/2021, 8:00 AM

## 2021-02-22 NOTE — Progress Notes (Addendum)
Wound vac now at 0% suction. Says blockage. Dr Lucia Gaskins aware and request wound vac to be left on and will see pt tomorrow. No new orders at this time

## 2021-02-22 NOTE — Progress Notes (Signed)
Patient ID: OMAH DEWALT, female   DOB: [birth year], 75 y.o.   MRN: [id number] Follow up on concerns regarding wound vac and IV abx training. SW notes supplies ordered for wound vac and training scheduled with family re IV abx through 03/14/21. Continue dressing to left thigh and reviewed care of sight at home. Continue to follow through discharge to address educational needs. Margarito Liner

## 2021-02-22 NOTE — Progress Notes (Signed)
Pt seen this am; wound vac beeping. Repositioned pt and stopped. Drainage this am was approx 247ml of dark red fluid. No c/o pain. Pt getting dressed and wound vac beeping again, repositioned pt and no alarm. After pt dressed alarm to wound vac continues to beep. Md notified and she will contact Dr Lucia Gaskins. Pt is concerned about care of wound vac at home.

## 2021-02-22 NOTE — NC FL2 (Signed)
[place] LEVEL OF CARE SCREENING TOOL     IDENTIFICATION  Patient Name: Kayla Harrison Birthdate: [birth year] Sex: female Admission Date (Current Location): 02/13/2021  [facility] and [place] Number:  Herbalist and Address:  [facility] [place], [place] [address], [place], [place] [address]      Provider Number:    Attending Physician Name and Address:  Izora Ribas, MD  Relative Name and Phone Number:  [name] [phone]    Current Level of Care:   Recommended Level of Care: [place] Prior Approval Number:    Date Approved/Denied:   PASRR Number:    Discharge Plan: SNF    Current Diagnoses: Patient Active Problem List   Diagnosis Date Noted  . Polymicrobial bacterial infection   . 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   . Enterococcus faecalis infection   . Sepsis due to Escherichia coli ([place])   . Debility 02/13/2021  . Infection of wound hematoma 02/05/2021  . Physical debility 12/22/2020  . Tear of left hamstring 12/22/2020  . Labral tear of left hip joint 12/22/2020  . Acute blood loss anemia   . Orthostatic hypotension 12/17/2020  . History of CVA (cerebrovascular accident) 12/17/2020  . Fall 12/15/2020  . Hip hematoma, left, initial encounter 12/15/2020  . Nondisplaced fracture of coronoid process of left ulna, initial encounter for closed fracture 12/15/2020  . Multiple rib fractures 12/15/2020  . COVID-19 virus infection 10/12/2020  . Constipation 06/22/2020  . Change in bowel habits 06/22/2020  . Diverticulosis 06/22/2020  . Dark stools 06/22/2020  . Hemorrhoids 06/22/2020  . Pacemaker 01/23/2020  . Junctional rhythm 01/23/2020  . Palpitations 01/23/2020  . ICH (intracerebral hemorrhage) ([place]) 12/02/2019  . A-fib ([place]) 12/02/2019  . Anemia 12/02/2019  . QT prolongation 12/02/2019  . Hypothyroidism 12/02/2019  . Gait abnormality 07/10/2018  . S/P reverse total  shoulder arthroplasty, 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  . Persistent atrial fibrillation ([place])   . Bilateral ureteral calculi 11/17/2017  . Atrial fibrillation with RVR ([place]) 09/15/2017  . Atrial flutter ([place]) 07/17/2017  . Port catheter in place 04/02/2017  . Non-Hodgkin lymphoma, unspecified, intrathoracic lymph nodes ([place]) 02/13/2017  . Mediastinal mass 02/06/2017  . 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 Abnormal chest x-ray   . Lung mass 02/03/2017  . 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  . OSA (obstructive sleep apnea) 02/03/2015  . History of renal calculi 11/16/2014  . Renal calculi 11/16/2014  . Family history of colon cancer 10/26/2014  . Mechanical complication due to cardiac pacemaker pulse generator 11/06/2013  . Dyspnea 05/12/2013  . Hypothyroidism 04/21/2013  . Pleural effusion 04/19/2013  . Long term (current) use of anticoagulants 12/20/2010  . EPIDERMOID CYST 08/22/2010  . Chronic diastolic heart failure ([place]) 08/06/2010  . SYNCOPE AND COLLAPSE 07/27/2010  . OSTEOARTHRITIS, KNEE, RIGHT 03/15/2010  . Class 1 obesity due to excess calories with body mass index (BMI) of 33.0 to 33.9 in adult 12/07/2009  . NONSPEC ELEVATION OF LEVELS OF TRANSAMINASE/LDH 09/08/2009  . BREAST CANCER, HX OF 07/25/2009  . COLONIC POLYPS, HX OF 07/25/2009  . TUBULOVILLOUS ADENOMA, COLON 04/29/2008  . HYPERGLYCEMIA, FASTING 04/29/2008  . HYPERLIPIDEMIA 06/22/2007  . CARCINOMA, THYROID GLAND, HX OF 06/22/2007    [place] RESPIRATION BLADDER Height & Weight     Time,Situation,Self,Place  Normal Continent Weight:   Height:     BEHAVIORAL SYMPTOMS/MOOD NEUROLOGICAL BOWEL NUTRITION STATUS      Continent Diet  AMBULATORY STATUS COMMUNICATION  OF NEEDS Skin   Limited Assist Verbally Wound Vac (Wound vac to left thigh wound)                       Personal Care Assistance Level of Assistance  Bathing,Dressing Bathing Assistance: Limited assistance   Dressing Assistance: Limited  assistance     Functional Limitations Info             SPECIAL CARE FACTORS FREQUENCY  PT (By licensed PT),OT (By licensed OT),Speech therapy     PT Frequency: 5x a week OT Frequency: 5x a week     Speech Therapy Frequency: 5x a week      Contractures      Additional Factors Info                  Current Medications (02/22/2021):  This is the current hospital active medication list Current Facility-Administered Medications  Medication Dose Route Frequency Provider Last Rate Last Admin  . acetaminophen (TYLENOL) tablet 650 mg  650 mg Oral Q6H PRN Angiulli, Lavon Paganini, PA-C      . amiodarone (PACERONE) tablet 400 mg  400 mg Oral Daily Deboraha Sprang, MD   400 mg at 02/22/21 0818  . Ampicillin-Sulbactam (UNASYN) 3 g in sodium chloride 0.9 % 100 mL IVPB  3 g Intravenous Q6H Tommy Medal, Lavell Islam, MD   Stopped at 02/22/21 1250  . bisacodyl (DULCOLAX) suppository 10 mg  10 mg Rectal Daily PRN Cathlyn Parsons, PA-C      . Chlorhexidine Gluconate Cloth 2 % PADS 6 each  6 each Topical BID Kirsteins, Luanna Salk, MD   6 each at 02/22/21 [phone]  . docusate sodium (COLACE) capsule 100 mg  100 mg Oral BID Cathlyn Parsons, PA-C   100 mg at 02/22/21 0818  . fluconazole (DIFLUCAN) tablet 150 mg  150 mg Oral Daily Bary Leriche, PA-C   150 mg at 02/22/21 1356  . HYDROcodone-acetaminophen (NORCO/VICODIN) 5-325 MG per tablet 1-2 tablet  1-2 tablet Oral Q4H PRN Cathlyn Parsons, PA-C   1 tablet at 02/20/21 2329  . lactulose (CHRONULAC) 10 GM/15ML solution 30 g  30 g Oral Daily PRN Cathlyn Parsons, PA-C   30 g at 02/14/21 0831  . levothyroxine (SYNTHROID) tablet 137 mcg  137 mcg Oral Once per day on Sun Mon Tue Wed Thu Fri Cathlyn Parsons, PA-C   137 mcg at 02/22/21 0500  . levothyroxine (SYNTHROID) tablet 68.5 mcg  68.5 mcg Oral Once per day on Sat Cathlyn Parsons, PA-C   68.5 mcg at 02/17/21 7824  . ondansetron (ZOFRAN) tablet 4 mg  4 mg Oral Q6H PRN Angiulli, Lavon Paganini, PA-C        Or  . ondansetron [facility]) injection 4 mg  4 mg Intravenous Q6H PRN Angiulli, Lavon Paganini, PA-C      . polyethylene glycol (MIRALAX / GLYCOLAX) packet 17 g  17 g Oral BID Cathlyn Parsons, PA-C   17 g at 02/16/21 2108  . rosuvastatin (CRESTOR) tablet 10 mg  10 mg Oral Once per day on Mon Thu Cathlyn Parsons, PA-C   10 mg at 02/22/21 0818  . senna (SENOKOT) tablet 17.2 mg  2 tablet Oral QHS Raulkar, Clide Deutscher, MD   17.2 mg at 02/20/21 2016  . sodium chloride flush (NS) 0.9 % injection 10-40 mL  10-40 mL Intracatheter PRN Raulkar, Clide Deutscher, MD      . sodium chloride  flush (NS) 0.9 % injection 10-40 mL  10-40 mL Intracatheter PRN Raulkar, Clide Deutscher, MD      . temazepam (RESTORIL) capsule 7.5 mg  7.5 mg Oral QHS PRN Charlett Blake, MD   7.5 mg at 02/21/21 2114     Discharge Medications: Please see discharge summary for a list of discharge medications.  Relevant Imaging Results:  Relevant Lab Results:   Additional Information IV ABX  Dyanne Iha

## 2021-02-23 DIAGNOSIS — R600 Localized edema: Secondary | ICD-10-CM

## 2021-02-23 MED ORDER — AMIODARONE HCL 200 MG PO TABS: 400.0000 mg | ORAL_TABLET | Freq: Every day | ORAL | 3 refills | Status: DC

## 2021-02-23 MED ORDER — HYDROCODONE-ACETAMINOPHEN 5-325 MG PO TABS: 1.0000 | ORAL_TABLET | Freq: Two times a day (BID) | ORAL | 0 refills | Status: DC | PRN

## 2021-02-23 MED ORDER — FUROSEMIDE 20 MG PO TABS
20.0000 mg | ORAL_TABLET | Freq: Once | ORAL | Status: AC
  Administered 2021-02-23: 20 mg via ORAL
  Filled 2021-02-23: qty 1

## 2021-02-23 MED ORDER — DOCUSATE SODIUM 100 MG PO CAPS: 100.0000 mg | ORAL_CAPSULE | Freq: Two times a day (BID) | ORAL | 0 refills | Status: DC

## 2021-02-23 MED ORDER — TEMAZEPAM 7.5 MG PO CAPS: 7.5000 mg | ORAL_CAPSULE | Freq: Every evening | ORAL | 0 refills | Status: DC | PRN

## 2021-02-23 MED ORDER — HEPARIN SOD (PORK) LOCK FLUSH 100 UNIT/ML IV SOLN
250.0000 [IU] | INTRAVENOUS | Status: DC | PRN
  Filled 2021-02-23: qty 2.5

## 2021-02-23 MED ORDER — SENNA 8.6 MG PO TABS: 2.0000 | ORAL_TABLET | Freq: Every day | ORAL | 0 refills | Status: DC

## 2021-02-23 MED ORDER — NYSTATIN 100000 UNIT/GM EX POWD: 1.0000 "application " | Freq: Three times a day (TID) | CUTANEOUS | 1 refills | Status: DC

## 2021-02-23 NOTE — Progress Notes (Signed)
PROGRESS NOTE   Subjective/Complaints: Dc delayed over concerns with vac and use of PICC. Pt supposed to get some further practice and instruction today before leaving. [place] RN in room and found clot in vac which was occluding the device/suction  ROS: Patient denies fever, rash, sore throat, blurred vision, nausea, vomiting, diarrhea, cough, shortness of breath or chest pain,   headache, or mood change.   Objective:   No results found. No results for input(s): WBC, HGB, HCT, PLT in the last 72 hours. Recent Labs    02/21/21 1556  NA 138  K 3.7  CL 104  CO2 28  GLUCOSE 102*  BUN 10  CREATININE 0.94  CALCIUM 8.3*    Intake/Output Summary (Last 24 hours) at 02/23/2021 1505 Last data filed at 02/23/2021 1452 Gross per 24 hour  Intake 356 ml  Output --  Net 356 ml        Physical Exam: Vital Signs Blood pressure (!) 136/98, pulse (!) 119, temperature 98.7 F (37.1 C), temperature source Oral, resp. rate 17, SpO2 97 %.  Constitutional: No distress . Vital signs reviewed. HEENT: EOMI, oral membranes moist Neck: supple Cardiovascular: RRR without murmur. No JVD    Respiratory/Chest: CTA Bilaterally without wheezes or rales. Normal effort    GI/Abdomen: BS +, non-tender, non-distended Ext: no clubbing, cyanosis, or edema Psych: pleasant and cooperative Skin: left thigh wound  with vac. Ace wrap injury areas with irritation, some fibronecrotic debris, associated candida, clots in wound, expressed into gauze dressing Neurologic: Cranial nerves II through XII intact, motor strength is 5/5 in bilateral deltoid, bicep, tricep, grip, hip flexor, 4- RIght and 3- left knee extensors, ankle dorsiflexor and plantar flexor Sensory exam normal sensation to light touch  in bilateral upper and lower extremities  Musculoskeletal: left thigh with swelling improved.      Assessment/Plan: 1. Functional deficits which require 3+ hours  per day of interdisciplinary therapy in a comprehensive inpatient rehab setting.  Physiatrist is providing close team supervision and 24 hour management of active medical problems listed below.  Physiatrist and rehab team continue to assess barriers to discharge/monitor patient progress toward functional and medical goals  Care Tool:  Bathing    Body parts bathed by patient: Right arm,Chest,Left arm,Abdomen,Front perineal area,Right upper leg,Left upper leg,Right lower leg,Left lower leg,Face,Buttocks         Bathing assist Assist Level: Contact Guard/Touching assist     Upper Body Dressing/Undressing Upper body dressing   What is the patient wearing?: Hospital gown only    Upper body assist Assist Level: Minimal Assistance - Patient > 75%    Lower Body Dressing/Undressing Lower body dressing      What is the patient wearing?: Pants,Underwear/pull up     Lower body assist Assist for lower body dressing: Minimal Assistance - Patient > 75%     Toileting Toileting    Toileting assist Assist for toileting: Contact Guard/Touching assist     Transfers Chair/bed transfer  Transfers assist     Chair/bed transfer assist level: Independent with assistive device Chair/bed transfer assistive device: Other (rollator)   Locomotion Ambulation   Ambulation assist      Assist level:  Independent with assistive device Assistive device: Rollator Max distance: 177ft   Walk 10 feet activity   Assist     Assist level: Independent with assistive device Assistive device: Rollator   Walk 50 feet activity   Assist Walk 50 feet with 2 turns activity did not occur: Safety/medical concerns (fatigue, weakness, decreased balance)  Assist level: Independent with assistive device Assistive device: Rollator    Walk 150 feet activity   Assist Walk 150 feet activity did not occur: Safety/medical concerns (fatigue, weakness, decreased balance)  Assist level: Independent  with assistive device Assistive device: Rollator    Walk 10 feet on uneven surface  activity   Assist Walk 10 feet on uneven surfaces activity did not occur: Safety/medical concerns (fatigue, weakness, decreased balance)   Assist level: Supervision/Verbal cueing Assistive device: Rollator   Wheelchair     Assist Will patient use wheelchair at discharge?: No             Wheelchair 50 feet with 2 turns activity    Assist            Wheelchair 150 feet activity     Assist          Blood pressure (!) 136/98, pulse (!) 119, temperature 98.7 F (37.1 C), temperature source Oral, resp. rate 17, SpO2 97 %.    Medical Problem List and Plan: 1.Debilitysecondary to infected thigh wound hematoma. Status post I&D 02/06/2021 with a wound VAC and drain placement -patient maynot yetshower -ELOS/Goals:plan d/c today late    2. Antithrombotics: -DVT/anticoagulation:SCDs. Chronic anticoagulation currently on hold -antiplatelet therapy: N/A 3. Post-operative paint:Hydrocodone as needed- continue 4. Mood:Provide emotional support -antipsychotic agents: N/A 5. Neuropsych: This patientiscapable of making decisions on herown behalf. 6. Skin/Wound Care:continue wound vac outpatient -edema control with elevation. Pt doesn't want TEDS or ACE wraps  -lasix $Remov'20mg'TGRcXK$  x one today for edema  -vac, local care to wounds, pt aware, HH f/u 7. Fluids/Electrolytes/Nutrition:Routine in and outs with follow-up chemistries 8. Acute blood loss anemia. Discussed Hgb 8.6, repeat Monday.  9. Atrial fibrillation/pacemaker. Continue Amiodarone 200 mg daily. Cardiac rate is some whatcontrolled. Eliquis on hold due to hematoma -pt continues to go in and out of atrial fib. Cardiology following. 10. Orthostasis. Resolved. Weaned off medications.  11. ID. E. Coli/E Faecalis.  Continue IV abx until 6/22.   -labs reviewed, CRP down to 0.8 and ESR to 36.   -pt education for mgt of PICC, IV abx 12. Hypothyroidism. Synthroid 13. NHL with SVC syndrome/lung cancer with radiation therapy as well as chemo. Follow-up Dr. Lindi Adie 14. Obesity. Status post lap band surgery. BMI 33.64. Dietary follow-up 15. Hyperlipidemia. Crestor  16.  Vascular access- PICC placed for IV abx. May d/c prior to discharge.  17. Constipation: improved with senna 2 tabs HS 18. Insomnia: .due to frequent urination from  IV fluids- being d/ced 5/32- continue temazepam.  20. Mild hypo K likely from diuresis- supplemented with improvement     LOS: 10 days A FACE TO FACE EVALUATION WAS PERFORMED  Meredith Staggers 02/23/2021, 3:05 PM

## 2021-02-23 NOTE — Progress Notes (Signed)
Patient noted up in her recliner at the beginning of the shift. Was given in report that patients wound vac was beeping & that the physician for the patients surgical wound follow up was contacted. Orders were given not to remove the dressing. The wound vac states that there is a blockage. This shift, the canister was changed to see if it would decrease the beeping, but it did not. Patient was a little upset that the beeping would not stop & was asking if we could turn it off. It was explained to the patient that if the vac was turned off, we would have to remove the dressing. Medications given & tolerated well. She triggered a yellow MEWS twice during the shift. Was nformed that she is in AFIB. Ascultation revealed a rapid & irregular pulse rate. No symptoms noted. Continuing Q4 vitals. Dressing was applied to left inner thigh wound from what the patient stated was an ace wrap that was placed prior to admission & caused blisters. The wound is circular & extends from the anterior thigh to just under the left buttock. It looks dry at this time but patient stated that it was draining & was left open to dry out. The wound has yellowish & a dark Tjuana Vickrey covering to some parts. Area was covered with a foam dressing after cleanse with normal saline. No acute distress noted. Will continue to monitor

## 2021-02-23 NOTE — Progress Notes (Signed)
Inpatient Rehabilitation Care Coordinator Discharge Note  The overall goal for the admission was met for:   Discharge location: Yes, home  Length of Stay: Yes, 10 Days  Discharge activity level: Yes, Modified independent level  Home/community participation: Yes  Services provided included: MD, RD, PT, OT, SLP, RN, CM, TR, Pharmacy, Neuropsych and SW  Financial Services: Medicare  Choices offered to/list presented to:pt  Follow-up services arranged: Home Health: [facility]  Comments (or additional information): RN PT OT  Adavnced IV Infusions  NO DME [facility] Wound VAC   Patient/Family verbalized understanding of follow-up arrangements: Yes  Individual responsible for coordination of the follow-up plan: pt, [phone]  Confirmed correct DME delivered: Dyanne Iha 02/23/2021    Dyanne Iha

## 2021-02-23 NOTE — Discharge Instructions (Signed)
Inpatient Rehab Discharge Instructions  Kayla Harrison Discharge date and time: 02/23/21   Activities/Precautions/ Functional Status: Activity: activity as tolerated Diet: regular diet  Wound Care:  1. Left lateral hip: Change wound VAC Mon, Wed, Fri. Be sure to pack tunnels at 5 and 1 o'clock. Contact Dr. Lucia Gaskins with questions/concerns.   2 To abrasion on left thigh; Cleanse left upper thigh wound with soap and water. Pat dry. Cut and/or fold a Xeroform gauze Kellie Simmering 294) and place the Xeroform OVER THE WOUND AREA ONLY (not the intact skin). Sprinkle antifungal powder (green and white bottle in clean utility) on the red moist tissue around the wound. Then cover the entire wound with a foam dressing.  The Xeroform must be changed daily and the antifungal powder applied daily.  The foam dressing can be used up to 3 days if not soiled.      Functional status:  ___ No restrictions     ___ Walk up steps independently ___ 24/7 supervision/assistance   ___ Walk up steps with assistance _X__ Intermittent supervision/assistance  ___ Bathe/dress independently ___ Walk with walker     _X__ Bathe/dress with assistance ___ Walk Independently    ___ Shower independently ___ Walk with assistance    ___ Shower with assistance _X__ No alcohol     ___ Return to work/school ________    COMMUNITY REFERRALS UPON DISCHARGE:    Home Health:   PT     OT      RN                   Agency: [facility]  Phone: [phone]    Special Instructions:  1. Follow up scheduled at [facility] on next Wednesday, June 8th at 7:30 AM. 2.  No driving, smoking or alcohol 3. End date of antibiotics on 06/24    My questions have been answered and I understand these instructions. I will adhere to these goals and the provided educational materials after my discharge from the hospital.  Patient/Caregiver Signature _______________________________ Date  __________  Clinician Signature _______________________________________ Date __________  Please bring this form and your medication list with you to all your follow-up doctor's appointments.

## 2021-02-23 NOTE — Progress Notes (Signed)
Area of prior injury form ace wrap with candidal over growh  Left thigh wound with wound VAC   Clots expressed from wound change this am.

## 2021-02-23 NOTE — Progress Notes (Signed)
     Kayla Harrison is a 75 y.o. female   Orthopaedic diagnosis: Status post I&D and evacuation of hematoma and abscess to the left thigh with wound VAC placement  Subjective: Patient is seen sitting up in the bedside chair.  She is anticipating discharge within the next few minutes.  Wound care nurse came by earlier to change the VAC.  It has been functioning since then.  She denies increasing pain.  Objectyive: Vitals:   02/23/21 0817 02/23/21 1328  BP: (!) 135/106 (!) 136/98  Pulse: (!) 113 (!) 119  Resp: 17 17  Temp: 97.8 F (36.6 C) 98.7 F (37.1 C)  SpO2:  97%     Exam: Awake and alert Respirations even and unlabored No acute distress  Left thigh with functional wound VAC.  No significant erythema.  Assessment: Status post left thigh hematoma/abscess evacuation with revision wound closure and VAC placement   Plan: Patient is suitable to discharge from orthopedic standpoint. She has home health nursing set up for 3 times a week VAC changes.  During VAC changes any persistent hematoma needs to be evacuated and the wound irrigated.  Sponge will be placed in the open portion of the wound. I will plan to see the patient back in approximately 1 week for wound check. I had a conversation with Dr. Caryl Comes her cardiologist and given her persistent atrial fibrillation and a significantly elevated risk of stroke we have decided to restart her Eliquis.  She will begin taking this when she gets home.  She will monitor wound VAC output and contact us with significant increased output. She is being discharged on continuous infusion of Unasyn.  She has close follow-up with infectious disease.  Again I reiterated the possibility of repeat surgery if the wound does not appropriately heal or the cavity persists or she has reinfection.  If that is the case she would likely require revision debridement with a more significant portion of the wound left open for long-term VAC therapy.  I am  hopeful that we will avoid this.    Radene Journey, MD

## 2021-02-23 NOTE — Progress Notes (Signed)
Received call from [facility] with [place]. Wanted follow up on wound vac. Informed that had communicated with Dr. Lucia Gaskins via chat and around 1500 I replaced wound vac and was working appropriately till about 1640 leaving original dressing in place. Afterwards I was unable to maintain pressure and was instructed to leave wound vac on and dressing in place and he would see pt today.  At this time there is no consult for WOC to see pt.

## 2021-02-23 NOTE — Progress Notes (Signed)
Pt is requesting written instructions for the IV medication to be administered at home. Also would like Pam (with IV) to come back today for additional training.  I have printed out the instructions for the wound to the left upper thigh for pt.

## 2021-02-23 NOTE — Progress Notes (Signed)
Home care nurse educator at [facility] Primary nurse requested [facility]AST to return. This nurse instructed to re-consult when pt ready for DC. VU. Fran Lowes, RN VAST

## 2021-02-23 NOTE — Progress Notes (Signed)
This nurse educated patient on use of PICC line, flushing the unused lumen with saline and heparin. Instructed on pulsating motion with flushing. Patient demonstrated back. Added extension and tapped unused line to secure. Pt VU. Fran Lowes, RN VAST

## 2021-02-23 NOTE — Progress Notes (Signed)
Discharge instructions provided to pt by D. Hervey Ard, RN, pt discharged with personal belongings and equipment. Pt verbalized an understanding of all information provided regarding medications, follow up appts IV therapy, and wound vac. Pt supplied all equipment for IV therapy and wound vac as well as dressing changes with instructions to left thigh.

## 2021-02-23 NOTE — Progress Notes (Signed)
Reviewed dressing changes, follow up as well as meds briefly with patient. Advised patient to ask/allow friends to assist as needed. She continues to decline SNF and is confident that she can do this.

## 2021-02-23 NOTE — Progress Notes (Signed)
Patient ID: Kayla Harrison, female   DOB: 03/27/1946, 75 y.o.   MRN: [id number] Kayla Harrison has an appointment on 03/05/2021 at 330PM with Dr. Tommy Medal at   [facility] is located in the [facility] at  [place] in [place].  [address], which is located to the left of the elevators.  Phone: [phone]  Fax: [phone]

## 2021-02-23 NOTE — Plan of Care (Signed)
Pt has an understanding of wound vac and able to verbalize plan if wound vac not working. Went over dressing to left thigh and provided written instructions for discharge. Pt reports that understands. Pt awaiting Pam with IV for additional teaching.

## 2021-02-23 NOTE — Progress Notes (Signed)
Subjective:  No new complaints   Antibiotics:  Anti-infectives (From admission, onward)   Start     Dose/Rate Route Frequency Ordered Stop   02/22/21 1300  fluconazole (DIFLUCAN) tablet 150 mg        150 mg Oral Daily 02/22/21 1208 02/23/21 0926   02/21/21 1230  Ampicillin-Sulbactam (UNASYN) 3 g in sodium chloride 0.9 % 100 mL IVPB        3 g 200 mL/hr over 30 Minutes Intravenous Every 6 hours 02/21/21 1059     02/21/21 0000  ampicillin-sulbactam (UNASYN) IVPB        3 g Intravenous Every 6 hours 02/21/21 1506 03/14/21 2359   02/13/21 1630  Ampicillin-Sulbactam (UNASYN) 3 g in sodium chloride 0.9 % 100 mL IVPB  Status:  Discontinued        3 g 200 mL/hr over 30 Minutes Intravenous Every 6 hours 02/13/21 1543 02/20/21 1027      Medications: Scheduled Meds: . amiodarone  400 mg Oral Daily  . Chlorhexidine Gluconate Cloth  6 each Topical BID  . docusate sodium  100 mg Oral BID  . levothyroxine  137 mcg Oral Once per day on Sun Mon Tue Wed Thu Fri  . levothyroxine  68.5 mcg Oral Once per day on Sat  . rosuvastatin  10 mg Oral Once per day on Mon Thu  . senna  2 tablet Oral QHS   Continuous Infusions: . ampicillin-sulbactam (UNASYN) IV Stopped (02/23/21 0742)   PRN Meds:.acetaminophen, bisacodyl, HYDROcodone-acetaminophen, lactulose, ondansetron **OR** ondansetron (ZOFRAN) IV, sodium chloride flush, sodium chloride flush, temazepam    Objective: Weight change:   Intake/Output Summary (Last 24 hours) at 02/23/2021 1124 Last data filed at 02/23/2021 0849 Gross per 24 hour  Intake 238 ml  Output --  Net 238 ml   Blood pressure (!) 135/106, pulse (!) 113, temperature 97.8 F (36.6 C), temperature source Oral, resp. rate 17, SpO2 96 %. Temp:  [97.8 F (36.6 C)-99 F (37.2 C)] 97.8 F (36.6 C) (06/03 0817) Pulse Rate:  [103-113] 113 (06/03 0817) Resp:  [17-18] 17 (06/03 0817) BP: (128-135)/(91-106) 135/106 (06/03 0817) SpO2:  [94 %-97 %] 96 % (06/03  0342)  Physical Exam: Physical Exam Constitutional:      General: She is not in acute distress.    Appearance: She is well-developed. She is not diaphoretic.  HENT:     Head: Normocephalic and atraumatic.     Right Ear: External ear normal.     Left Ear: External ear normal.     Mouth/Throat:     Pharynx: No oropharyngeal exudate.  Eyes:     General: No scleral icterus.    Conjunctiva/sclera: Conjunctivae normal.     Pupils: Pupils are equal, round, and reactive to light.  Cardiovascular:     Rate and Rhythm: Normal rate. Rhythm irregular.     Heart sounds: Friction rub present.  Pulmonary:     Effort: Pulmonary effort is normal. No respiratory distress.     Breath sounds: No wheezing.  Abdominal:     General: There is no distension.     Palpations: Abdomen is soft.  Musculoskeletal:        General: No tenderness. Normal range of motion.  Lymphadenopathy:     Cervical: No cervical adenopathy.  Skin:    General: Skin is warm and dry.     Coloration: Skin is not pale.     Findings: No erythema or rash.  Neurological:  General: No focal deficit present.     Mental Status: She is alert and oriented to person, place, and time.     Motor: No abnormal muscle tone.     Coordination: Coordination normal.  Psychiatric:        Mood and Affect: Mood normal.        Behavior: Behavior normal.        Thought Content: Thought content normal.        Judgment: Judgment normal.     Left hip with vacuum dressing   CBC:    BMET Recent Labs    02/21/21 1556  NA 138  K 3.7  CL 104  CO2 28  GLUCOSE 102*  BUN 10  CREATININE 0.94  CALCIUM 8.3*     Liver Panel  No results for input(s): PROT, ALBUMIN, AST, ALT, ALKPHOS, BILITOT, BILIDIR, IBILI in the last 72 hours.     Sedimentation Rate No results for input(s): ESRSEDRATE in the last 72 hours. C-Reactive Protein No results for input(s): CRP in the last 72 hours.  Micro Results: Recent Results (from the past  720 hour(s))  Blood culture (routine x 2)     Status: None   Collection Time: 02/05/21  2:58 PM   Specimen: BLOOD LEFT ARM  Result Value Ref Range Status   Specimen Description BLOOD LEFT ARM  Final   Special Requests   Final    BOTTLES DRAWN AEROBIC AND ANAEROBIC Blood Culture adequate volume   Culture   Final    NO GROWTH 6 DAYS Performed at [facility], [address], [place], [place] [address]    Report Status 02/11/2021 FINAL  Final  Aerobic Culture w Gram Stain (superficial specimen)     Status: None   Collection Time: 02/05/21  3:38 PM   Specimen: Wound  Result Value Ref Range Status   Specimen Description WOUND  Final   Special Requests NONE  Final   Gram Stain   Final    ABUNDANT WBC PRESENT, PREDOMINANTLY PMN ABUNDANT GRAM NEGATIVE RODS MODERATE GRAM POSITIVE COCCI Performed at [facility], [place] [address], [place], [place] [address]    Culture   Final    MODERATE ESCHERICHIA COLI MODERATE ENTEROCOCCUS FAECALIS    Report Status 02/09/2021 FINAL  Final   Organism ID, Bacteria ESCHERICHIA COLI  Final   Organism ID, Bacteria ENTEROCOCCUS FAECALIS  Final      Susceptibility   Escherichia coli - MIC*    AMPICILLIN >=32 RESISTANT Resistant     CEFAZOLIN <=4 SENSITIVE Sensitive     CEFEPIME <=0.12 SENSITIVE Sensitive     CEFTAZIDIME <=1 SENSITIVE Sensitive     CEFTRIAXONE <=0.25 SENSITIVE Sensitive     CIPROFLOXACIN <=0.25 SENSITIVE Sensitive     GENTAMICIN <=1 SENSITIVE Sensitive     IMIPENEM <=0.25 SENSITIVE Sensitive     TRIMETH/SULFA >=320 RESISTANT Resistant     AMPICILLIN/SULBACTAM 16 INTERMEDIATE Intermediate     PIP/TAZO <=4 SENSITIVE Sensitive     * MODERATE ESCHERICHIA COLI   Enterococcus faecalis - MIC*    AMPICILLIN <=2 SENSITIVE Sensitive     VANCOMYCIN 1 SENSITIVE Sensitive     GENTAMICIN SYNERGY RESISTANT Resistant     * MODERATE ENTEROCOCCUS FAECALIS  Resp Panel by RT-PCR (Flu A&B, Covid) Nasopharyngeal Swab     Status: None    Collection Time: 02/05/21  4:00 PM   Specimen: Nasopharyngeal Swab; Nasopharyngeal(NP) swabs in vial transport medium  Result Value Ref Range Status  SARS Coronavirus 2 by RT PCR NEGATIVE NEGATIVE Final    Comment: (NOTE) SARS-CoV-2 target nucleic acids are NOT DETECTED.  The SARS-CoV-2 RNA is generally detectable in upper respiratory specimens during the acute phase of infection. The lowest concentration of SARS-CoV-2 viral copies this assay can detect is 138 copies/mL. A negative result does not preclude SARS-Cov-2 infection and should not be used as the sole basis for treatment or other patient management decisions. A negative result may occur with  improper specimen collection/handling, submission of specimen other than nasopharyngeal swab, presence of viral mutation(s) within the areas targeted by this assay, and inadequate number of viral copies(<138 copies/mL). A negative result must be combined with clinical observations, patient history, and epidemiological information. The expected result is Negative.  Fact Sheet for Patients:  BloggerCourse.com  Fact Sheet for Healthcare Providers:  SeriousBroker.it  This test is no t yet approved or cleared by the [place] FDA and  has been authorized for detection and/or diagnosis of SARS-CoV-2 by FDA under an Emergency Use Authorization (EUA). This EUA will remain  in effect (meaning this test can be used) for the duration of the COVID-19 declaration under Section 564(b)(1) of the Act, 21 U.S.C.section 360bbb-3(b)(1), unless the authorization is terminated  or revoked sooner.       Influenza A by PCR NEGATIVE NEGATIVE Final   Influenza B by PCR NEGATIVE NEGATIVE Final    Comment: (NOTE) The Xpert Xpress SARS-CoV-2/FLU/RSV plus assay is intended as an aid in the diagnosis of influenza from Nasopharyngeal swab specimens and should not be used as a sole basis for treatment.  Nasal washings and aspirates are unacceptable for Xpert Xpress SARS-CoV-2/FLU/RSV testing.  Fact Sheet for Patients: BloggerCourse.com  Fact Sheet for Healthcare Providers: SeriousBroker.it  This test is not yet approved or cleared by the [place] FDA and has been authorized for detection and/or diagnosis of SARS-CoV-2 by FDA under an Emergency Use Authorization (EUA). This EUA will remain in effect (meaning this test can be used) for the duration of the COVID-19 declaration under Section 564(b)(1) of the Act, 21 U.S.C. section 360bbb-3(b)(1), unless the authorization is terminated or revoked.  Performed at [facility], [address] [place], [place] [address]   Surgical pcr screen     Status: None   Collection Time: 02/06/21  1:51 AM   Specimen: Nasal Mucosa; Nasal Swab  Result Value Ref Range Status   MRSA, PCR NEGATIVE NEGATIVE Final   Staphylococcus aureus NEGATIVE NEGATIVE Final    Comment: (NOTE) The Xpert SA Assay (FDA approved for NASAL specimens in patients 13 years of age and older), is one component of a comprehensive surveillance program. It is not intended to diagnose infection nor to guide or monitor treatment. Performed at [facility], [address] [place], [place] [address]   Culture, blood (Routine X 2) w Reflex to ID Panel     Status: None   Collection Time: 02/06/21  5:29 AM   Specimen: BLOOD RIGHT HAND  Result Value Ref Range Status   Specimen Description BLOOD RIGHT HAND  Final   Special Requests   Final    BOTTLES DRAWN AEROBIC AND ANAEROBIC Blood Culture adequate volume   Culture   Final    NO GROWTH 5 DAYS Performed at [facility], [address] [place], [place] [address]    Report Status 02/11/2021 FINAL  Final  Aerobic/Anaerobic Culture w Gram Stain (surgical/deep wound)     Status: None  Collection Time: 02/06/21  9:47 AM   Specimen: PATH Other; Tissue   Result Value Ref Range Status   Specimen Description TISSUE  Final   Special Requests LEFT HIP ABSC SPEC A  Final   Gram Stain   Final    FEW WBC PRESENT,BOTH PMN AND MONONUCLEAR MODERATE GRAM POSITIVE COCCI RARE GRAM NEGATIVE RODS    Culture   Final    ABUNDANT ESCHERICHIA COLI ABUNDANT ENTEROCOCCUS FAECALIS MODERATE ACINETOBACTER CALCOACETICUS/BAUMANNII COMPLEX MODERATE BACTEROIDES THETAIOTAOMICRON BETA LACTAMASE POSITIVE SEE SEPARATE REPORT FOR ADDITIONAL SUSCEPTIBILITY RESULTS Performed at [facility], [place] [address] [place], [place] [address]    Report Status 02/19/2021 FINAL  Final   Organism ID, Bacteria ESCHERICHIA COLI  Final   Organism ID, Bacteria ENTEROCOCCUS FAECALIS  Final   Organism ID, Bacteria ACINETOBACTER CALCOACETICUS/BAUMANNII COMPLEX  Final      Susceptibility   Acinetobacter calcoaceticus/baumannii complex - MIC*    CEFTAZIDIME >=64 RESISTANT Resistant     CIPROFLOXACIN >=4 RESISTANT Resistant     GENTAMICIN <=1 SENSITIVE Sensitive     IMIPENEM >=16 RESISTANT Resistant     PIP/TAZO >=128 RESISTANT Resistant     TRIMETH/SULFA >=320 RESISTANT Resistant     AMPICILLIN/SULBACTAM 8 SENSITIVE Sensitive     * MODERATE ACINETOBACTER CALCOACETICUS/BAUMANNII COMPLEX   Escherichia coli - MIC*    AMPICILLIN >=32 RESISTANT Resistant     CEFAZOLIN <=4 SENSITIVE Sensitive     CEFEPIME <=0.12 SENSITIVE Sensitive     CEFTAZIDIME <=1 SENSITIVE Sensitive     CEFTRIAXONE <=0.25 SENSITIVE Sensitive     CIPROFLOXACIN <=0.25 SENSITIVE Sensitive     GENTAMICIN <=1 SENSITIVE Sensitive     IMIPENEM <=0.25 SENSITIVE Sensitive     TRIMETH/SULFA >=320 RESISTANT Resistant     AMPICILLIN/SULBACTAM 8 SENSITIVE Sensitive     PIP/TAZO <=4 SENSITIVE Sensitive     * ABUNDANT ESCHERICHIA COLI   Enterococcus faecalis - MIC*    AMPICILLIN <=2 SENSITIVE Sensitive     VANCOMYCIN 1 SENSITIVE Sensitive     GENTAMICIN SYNERGY RESISTANT Resistant     * ABUNDANT ENTEROCOCCUS  FAECALIS    Studies/Results: No results found.    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 female with polymicrobial soft tissue abscess with Enterococcus faecalis E. coli and multidrug resistant Acinetobacter isolated on culture.  1.  Polymicrobial abscess  Is being switched over to continuous infusion of Unasyn  We will plan on tentatively giving her an additional 3 weeks of treatment.  I will arrange follow-up in our clinic in 2 weeks and she should follow-up closely with Dr. Lucia Gaskins  I spent more than 35 minutes with the patient including greater than 50% of time in face to face counseling of the patient personally reviewing radiographs, long with pertinent laboratory microbiological data review of medical records and in coordination of her care.  Diagnosis: Polymicrobial abscess  Culture Result: Acinetobacter Enterococcus faecalis and E. coli  Allergies  Allergen Reactions  . Dofetilide Other (See Comments)    Prolonged QT interval   . Ranolazine     Balance issues  . Rivaroxaban Other (See Comments)    Nose Bleed X 6 hrs ; packing  in ER Nasal hemorrhage  . Rivaroxaban     Nose Bleed X 6 hrs ; packing in ER Nasal hemorrhage  . Tape Other (See Comments)    SKIN IS THIN AND TEARS EASILY   . Tikosyn [Dofetilide] Other (See Comments)    "Long QT wave"   . Epinephrine Other (See Comments)    Oral anesthetic Dental form only (liquid). Patient stated she will become "out of it" she can hear you but cannot respond in normal fashion. "not fully with it"  . Epinephrine   . Ketamine Other (See Comments)    Hallucinations   . Adhesive [Tape] Rash     Paper tape as well  . Ketamine Other (See Comments)    HALLUCINATIONS    OPAT Orders Discharge antibiotics:  Unasyn 3 g IV every 8 6 hours Duration:  3 weeks End Date:  March 14, 2021  [facility] Care Per Protocol:    Labs  weekly while on IV antibiotics: _x_ CBC with differential _x_ BMP w GFR/CMP _x_ CRP _x_ ESR    __ Please pull PIC at completion of IV antibiotics x__ Please leave PIC in place until doctor has seen patient or been notified  Fax weekly labs to [phone]   TEIGAN MANNER has an appointment on 03/05/2021 at 330PM with Dr. Tommy Medal at   [facility] is located in the [facility] at  [address] in [place].  [address], which is located to the left of the elevators.  Phone: [phone]  Fax: [phone]  [email or web address]  She should arrive 15 to 30 minutes prior to her appointment  Been her my card with the appointment time on it.  I will sign off for now please call further questions.    LOS: 10 days   Alcide Evener 02/23/2021, 11:24 AM

## 2021-02-23 NOTE — Progress Notes (Signed)
Pam with IV therapy will see pt at 1300

## 2021-02-23 NOTE — Consult Note (Signed)
[facility] Nurse Consult Note: Patient receiving care in [place].  Reason for Consult: Evaluation of left thigh VAC and orders for left inner upper thigh wound requested from Dr. Gwenlyn Saran. Wound type: Left inner/upper thigh wound is a result of an Ace Wrap that was used weeks ago after her first surgery. See photo from yesterday for characteristics of this wound.  Today I entered an order for the following:   Cleanse left upper thigh wound with soap and water. Pat dry. Cut and/or fold a Xeroform gauze Kellie Simmering 294) and place the Xeroform OVER THE WOUND AREA ONLY (not the intact skin). Sprinkle antifungal powder (green and white bottle in clean utility) on the red moist tissue around the wound. Then cover the entire wound with a foam dressing.  The Xeroform must be changed daily and the antifungal powder applied daily.  The foam dressing can be used up to 3 days if not soiled.  Dr. Lucia Gaskins also requested I evaluate the [facility] dressing to the left thigh and change if necessary to allow therapy to continue. Upon entry into the room the machine was alarming blockage and showing 0 mmHg for negative pressure. The patient was positioned in the bed. I removed the one piece of VAC foam and discovered clots under the foam, which were expressed--see photo.  I then measured the wound. It measures 2 cm x 1.5 cm x 2.9 cm. There is a 7 cm tunnel at 5 o'clock and a 9 cm tunnel at 1 o'clock. I cut and placed segments of Mepitel over the remaining incision sutures to protect them from the VAC drape. Then I cut one continuous segment of black foam and tucked it down into the wound opening.  I elected NOT to attempt placement of the black foam into the tunnels.  This would be risky for foam segment breakoff of portions of the foam and entrapment in the wound.  The drape was applied an an immediate seal obtained.  The patient tolerated very well.  I updated P. Love, PA in person; Dr. Tessa Lerner in person--he entered the room as I was leaving;  and Dr. Lucia Gaskins via [place].  Home VAC supplies are in the room and the patient tells me she is being discharged today.  [facility] nurse will not follow at this time.  Please re-consult the [facility] team if needed.  Val Riles, RN, MSN, CWOCN, CNS-BC, pager [phone]

## 2021-02-23 NOTE — Progress Notes (Signed)
Kayla Harrison with IV therapy here to hook pt up to continuous fusion. Sending order for IV team to cap and flush other port.

## 2021-02-26 ENCOUNTER — Encounter: Payer: Self-pay | Admitting: *Deleted

## 2021-02-26 DIAGNOSIS — S2241XD Multiple fractures of ribs, right side, subsequent encounter for fracture with routine healing: Secondary | ICD-10-CM | POA: Diagnosis not present

## 2021-02-26 DIAGNOSIS — S71002D Unspecified open wound, left hip, subsequent encounter: Secondary | ICD-10-CM | POA: Diagnosis not present

## 2021-02-26 DIAGNOSIS — W19XXXD Unspecified fall, subsequent encounter: Secondary | ICD-10-CM | POA: Diagnosis not present

## 2021-02-26 DIAGNOSIS — S52045D Nondisplaced fracture of coronoid process of left ulna, subsequent encounter for closed fracture with routine healing: Secondary | ICD-10-CM | POA: Diagnosis not present

## 2021-02-26 DIAGNOSIS — I110 Hypertensive heart disease with heart failure: Secondary | ICD-10-CM | POA: Diagnosis not present

## 2021-02-26 DIAGNOSIS — I5032 Chronic diastolic (congestive) heart failure: Secondary | ICD-10-CM | POA: Diagnosis not present

## 2021-02-26 NOTE — [facility]ational Nurse Program (Signed)
  Dept: [facility] Nurse Program Note  Date of Encounter: 02/22/21   Past Medical History: Past Medical History:  Diagnosis Date  . Anemia   . Arthritis    osteoarthritis - knees and right shoulder  . Blood transfusion without reported diagnosis   . Breast cancer ([place])    Dr Margot Chimes, total thyroidectomy- 1999- for cancer  . 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 Chronic bilateral pleural effusions   . Colon polyp    Dr Earlean Shawl  . Complication of anesthesia    Ketamine produces LSD reaction, bright colored nightmarish experience   . Dyslipidemia   . Endometriosis   . Fibroids   . H/O pleural effusion    s/p thoracentesis w 3264ml withdrawn  . Hepatitis    [place]hepatitis   . History of dysphagia    due to radiation therapy  . History of hiatal hernia    small noted on PET scan  . Hypertension   . Hypothyroidism   . Lung cancer, lower lobe ([place]) 01/2017   radiation RX completed 03/04/17; will start chemo 6/27, pt unaware of lung cancer  . Morbid obesity ([place])    Status post lap band surgery  . Nephrolithiasis   . Non Hodgkin's lymphoma ([place])    on chemotherapy  . Persistent atrial fibrillation ([place])    a. s/p PVI 2008 b. s/p convergent ablation 5830 complicated by bradycardia requiring pacemaker implant  . Personal history of radiation therapy   . Presence of permanent cardiac pacemaker   . Rotator cuff tear    Right  . Stroke [facility])    2003- [place] x2  . SVC syndrome    with lung mass and non hodgkins lymphoma  . Thyroid cancer ([place]) 2000    Encounter Details:  CNP Questionnaire - 02/26/21 1155      Questionnaire   Do you give verbal consent to treat you today? Yes    Visit Setting Hospital    Location Patient Served At Not Applicable    Patient Status Not Applicable    Medical Provider Yes    Insurance Medicare    Intervention Advocate    Referrals [facility] patient  to advocate for patient  and offer emotional support.  Worked with Carolynn Sayers of [facility] to learn administration of infusion therapy to observe patient at home on patient discharge in order to provide emotional support and confidence for patient.  CN observed Carolynn Sayers teaching patient infusion skills for patient to do at home.  After observing the patient, CN chooses not to go to patient's home to observe.  Instead,  CN recommended to patient that she hire an agency to assist her with her infusion care at home as it appeared to be overwhelming and could be daunting for patient without any assistance.  CN recommended to patient that she consider using an agency especially since the infusion time frame for antibiotic would last through 03/14/21. Patient advised CN that she would consider agency, but she did not want CN or social worker to procure agency nurse at this time.

## 2021-02-27 ENCOUNTER — Telehealth: Payer: Self-pay | Admitting: Endocrinology

## 2021-02-27 DIAGNOSIS — I110 Hypertensive heart disease with heart failure: Secondary | ICD-10-CM | POA: Diagnosis not present

## 2021-02-27 DIAGNOSIS — B952 Enterococcus as the cause of diseases classified elsewhere: Secondary | ICD-10-CM | POA: Diagnosis not present

## 2021-02-27 DIAGNOSIS — W19XXXD Unspecified fall, subsequent encounter: Secondary | ICD-10-CM | POA: Diagnosis not present

## 2021-02-27 DIAGNOSIS — I5032 Chronic diastolic (congestive) heart failure: Secondary | ICD-10-CM | POA: Diagnosis not present

## 2021-02-27 DIAGNOSIS — B9620 Unspecified Escherichia coli [E. coli] as the cause of diseases classified elsewhere: Secondary | ICD-10-CM | POA: Diagnosis not present

## 2021-02-27 DIAGNOSIS — Z792 Long term (current) use of antibiotics: Secondary | ICD-10-CM | POA: Diagnosis not present

## 2021-02-27 DIAGNOSIS — S52045D Nondisplaced fracture of coronoid process of left ulna, subsequent encounter for closed fracture with routine healing: Secondary | ICD-10-CM | POA: Diagnosis not present

## 2021-02-27 DIAGNOSIS — S2241XD Multiple fractures of ribs, right side, subsequent encounter for fracture with routine healing: Secondary | ICD-10-CM | POA: Diagnosis not present

## 2021-02-27 DIAGNOSIS — S71002D Unspecified open wound, left hip, subsequent encounter: Secondary | ICD-10-CM | POA: Diagnosis not present

## 2021-02-27 NOTE — Telephone Encounter (Signed)
   ILLYANNA PETILLO DOB: [birth year] MRN: [id number]   RIDER WAIVER AND RELEASE OF LIABILITY  For purposes of improving physical access to our facilities, [facility] is pleased to partner with third parties to provide [place] patients or other authorized individuals the option of convenient, on-demand ground transportation services (the [place]") through use of the technology service that enables users to request on-demand ground transportation from independent third-party providers.  By opting to use and accept these [employer], I, the undersigned, hereby agree on behalf of myself, and on behalf of any minor child using the [employer] for whom I am the parent or legal guardian, as follows:  1. Government social research officer provided to me are provided by independent third-party transportation providers who are not [employer] or employees and who are unaffiliated with [employer]. 2. [employer] [facility] is neither a transportation carrier nor a common or public carrier. 3. [facility] has no control over the quality or safety of the transportation that occurs as a result of the [employer]. 4. [facility] cannot guarantee that any third-party transportation provider will complete any arranged transportation service. 5. [facility] makes no representation, warranty, or guarantee regarding the reliability, timeliness, quality, safety, suitability, or availability of any of the Transport Services or that they will be error free. 6. I fully understand that traveling by vehicle involves risks and dangers of serious bodily injury, including permanent disability, paralysis, and death. I agree, on behalf of myself and on behalf of any minor child using the Transport Services for whom I am the parent or legal guardian, that the entire risk arising out of my use of the [employer] remains solely with me, to the maximum extent permitted under applicable law. 7. The [employer] are provided "as is" and "as available." [facility] disclaims all representations and warranties, express, implied or statutory, not expressly set out in these terms, including the implied warranties of merchantability and fitness for a particular purpose. 8. I hereby waive and release [facility], its agents, employees, officers, directors, representatives, insurers, attorneys, assigns, successors, subsidiaries, and affiliates from any and all past, present, or future claims, demands, liabilities, actions, causes of action, or suits of any kind directly or indirectly arising from acceptance and use of the [employer]. 9. I further waive and release [facility] and its affiliates from all present and future liability and responsibility for any injury or death to persons or damages to property caused by or related to the use of the [employer]. 10. I have read this Waiver and Release of Liability, and I understand the terms used in it and their legal significance. This Waiver is freely and voluntarily given with the understanding that my right (as well as the right of any minor child for whom I am the parent or legal guardian using the [employer]) to legal recourse against [place] in connection with the [employer] is knowingly surrendered in return for use of these services.   I attest that I read the consent document to Lyda Kalata, gave Ms. Enright the opportunity to ask questions and answered the questions asked (if any). I affirm that Lyda Kalata then provided consent for she's participation in this program.     Legrand Pitts

## 2021-02-27 NOTE — Telephone Encounter (Addendum)
It was Dr. Olin Pia recommendation that we hold BOTH of these for now.  These were both stopped 6/1 when we saw her, and she did not receive either again for the rest of her admission.   Legrand Como [address] [place], PA-C  02/27/2021 1:43 PM    Gave patient recommendations. Patient also asked when hospital follow up was, gave appointment time and date. Patient verbalized understanding.

## 2021-02-28 ENCOUNTER — Encounter (HOSPITAL_BASED_OUTPATIENT_CLINIC_OR_DEPARTMENT_OTHER): Payer: Medicare Other | Attending: Internal Medicine | Admitting: Internal Medicine

## 2021-02-28 ENCOUNTER — Other Ambulatory Visit: Payer: Self-pay

## 2021-02-28 DIAGNOSIS — S71102A Unspecified open wound, left thigh, initial encounter: Secondary | ICD-10-CM | POA: Insufficient documentation

## 2021-02-28 DIAGNOSIS — Z950 Presence of cardiac pacemaker: Secondary | ICD-10-CM | POA: Diagnosis not present

## 2021-02-28 DIAGNOSIS — S7002XA Contusion of left hip, initial encounter: Secondary | ICD-10-CM | POA: Insufficient documentation

## 2021-02-28 DIAGNOSIS — I4891 Unspecified atrial fibrillation: Secondary | ICD-10-CM | POA: Diagnosis not present

## 2021-02-28 DIAGNOSIS — Z853 Personal history of malignant neoplasm of breast: Secondary | ICD-10-CM | POA: Diagnosis not present

## 2021-02-28 DIAGNOSIS — Z7901 Long term (current) use of anticoagulants: Secondary | ICD-10-CM | POA: Insufficient documentation

## 2021-02-28 DIAGNOSIS — T8130XA Disruption of wound, unspecified, initial encounter: Secondary | ICD-10-CM | POA: Insufficient documentation

## 2021-02-28 DIAGNOSIS — W19XXXA Unspecified fall, initial encounter: Secondary | ICD-10-CM | POA: Insufficient documentation

## 2021-02-28 DIAGNOSIS — Y92009 Unspecified place in unspecified non-institutional (private) residence as the place of occurrence of the external cause: Secondary | ICD-10-CM | POA: Insufficient documentation

## 2021-02-28 NOTE — Progress Notes (Addendum)
Kayla Harrison, Kayla Harrison ([id number]) Visit Report for 02/28/2021 Arrival Information Details Patient Name: Date of Service: Kayla Harrison, Kayla Harrison 02/28/2021 8:00 A M Medical Record Number: [id number] Patient Account Number: [id number] Date of Birth/Sex: Treating RN: 05-Oct-1945 (75 y.o. Elam Dutch Primary Care Kamyia Thomason: [name] Other Clinician: Referring Rainey Kahrs: Treating Fraidy Mccarrick/Extender: Betsey Holiday in Treatment: 4 Visit Information History Since Last Visit Added or deleted any medications: No Patient Arrived: Wheel Chair Any new allergies or adverse reactions: No Arrival Time: 08:22 Had a fall or experienced change in No Accompanied By: self activities of daily living that may affect Transfer Assistance: None risk of falls: Patient Identification Verified: Yes Signs or symptoms of abuse/neglect since last visito No Secondary Verification Process Completed: Yes Hospitalized since last visit: Yes Patient Requires Transmission-Based Precautions: No Implantable device outside of the clinic excluding No Patient Has Alerts: Yes cellular tissue based products placed in the center Patient Alerts: Patient on Blood Thinner since last visit: Has Dressing in Place as Prescribed: Yes Pain Present Now: No Electronic Signature(s) Signed: 02/28/2021 5:19:11 PM By: Sandre Kitty Entered By: Sandre Kitty on 02/28/2021 08:22:26 -------------------------------------------------------------------------------- Encounter Discharge Information Details Patient Name: Date of Service: Kayla Bacon R. 02/28/2021 8:00 A M Medical Record Number: [id number] Patient Account Number: [id number] Date of Birth/Sex: Treating RN: Feb 29, 1946 (75 y.o. Tonita Phoenix, Lauren Primary Care Kandace Elrod: [name] Other Clinician: Referring Omarian Jaquith: Treating Shila Kruczek/Extender: Betsey Holiday in Treatment: 4 Encounter Discharge Information  Items Post Procedure Vitals Discharge Condition: Stable Temperature (F): 97.4 Ambulatory Status: Ambulatory Pulse (bpm): 74 Discharge Destination: Home Respiratory Rate (breaths/min): 17 Transportation: Private Auto Blood Pressure (mmHg): 147/74 Accompanied By: self Schedule Follow-up Appointment: Yes Clinical Summary of Care: Patient Declined Electronic Signature(s) Signed: 02/28/2021 6:04:38 PM By: Rhae Hammock RN Entered By: Rhae Hammock on 02/28/2021 11:15:56 -------------------------------------------------------------------------------- Multi Wound Chart Details Patient Name: Date of Service: Kayla Bacon R. 02/28/2021 8:00 A M Medical Record Number: [id number] Patient Account Number: [id number] Date of Birth/Sex: Treating RN: 05-08-1946 (75 y.o. Elam Dutch Primary Care Emmelina Mcloughlin: [name] Other Clinician: Referring Nahjae Hoeg: Treating Anita Mcadory/Extender: Betsey Holiday in Treatment: 4 Vital Signs Height(in): 66 Pulse(bpm): 139 Weight(lbs): 200 Blood Pressure(mmHg): 121/88 Body Mass Index(BMI): 32 Temperature(F): 97.8 Respiratory Rate(breaths/min): 18 Photos: [1:No Photos Left, Proximal Trochanter] [2:No Photos Left, Distal Trochanter] [3:No Photos Left, Medial Upper Leg] Wound Location: [1:Trauma] [2:Trauma] [3:Blister] Wounding Event: [1:Dehisced Wound] [2:Dehisced Wound] [3:Trauma, Other] Primary Etiology: [1:Sleep Apnea, Arrhythmia,] [2:Sleep Apnea, Arrhythmia,] [3:Sleep Apnea, Arrhythmia,] Comorbid History: [1:Hypotension, Osteoarthritis 12/15/2020] [2:Hypotension, Osteoarthritis 12/15/2020] [3:Hypotension, Osteoarthritis 02/14/2021] Date Acquired: [1:4] [2:4] [3:0] Weeks of Treatment: [1:Open] [2:Open] [3:Open] Wound Status: [1:1.6x0.7x2] [2:0.7x0.6x0.1] [3:2.2x16.5x0.1] Measurements L x W x D (cm) [1:0.88] [2:0.33] [3:28.51] A (cm) : rea [1:1.759] [2:0.033] [3:2.851] Volume (cm) : [1:66.00%] [2:63.10%]  [3:0.00%] % Reduction in A [1:rea: 91.00%] [2:63.30%] [3:0.00%] % Reduction in Volume: [1:1] Position 1 (o'clock): [1:9] Maximum Distance 1 (cm): [1:12] Starting Position 1 (o'clock): [1:12] Ending Position 1 (o'clock): [1:4] Maximum Distance 1 (cm): [1:Yes] [2:N/A] [3:No] Tunneling: [1:Yes] [2:No] [3:No] Undermining: [1:Full Thickness Without Exposed] [2:Full Thickness Without Exposed] [3:Full Thickness Without Exposed] Classification: [1:Support Structures Large] [2:Support Structures Medium] [3:Support Structures Medium] Exudate A mount: [1:Serosanguineous] [2:Serous] [3:Serosanguineous] Exudate Type: [1:red, brown] [2:amber] [3:red, brown] Exudate Color: [1:Well defined, not attached] [2:Flat and Intact] [3:Flat and Intact] Wound Margin: [1:Large (67-100%)] [2:None Present (0%)] [3:Small (1-33%)] Granulation A mount: [1:Pink] [2:N/A] [3:Pink] Granulation Quality: [1:None Present (  0%)] [2:Large (67-100%)] [3:Large (67-100%)] Necrotic A mount: [1:N/A] [2:Eschar] [3:Adherent Slough] Necrotic Tissue: [1:Fat Layer (Subcutaneous Tissue): Yes Fat Layer (Subcutaneous Tissue): Yes Fat Layer (Subcutaneous Tissue): Yes] Exposed Structures: [1:Fascia: No Tendon: No Muscle: No Joint: No Bone: No None] [2:Fascia: No Tendon: No Muscle: No Joint: No Bone: No None] [3:Fascia: No Tendon: No Muscle: No Joint: No Bone: No None] Epithelialization: [1:N/A] [2:N/A] [3:Chemical/Enzymatic/Mechanical] Debridement: [1:N/A] [2:N/A] [3:N/A] Instrument: [1:N/A] [2:N/A] [3:None] Bleeding: Debridement Treatment Response: N/A [2:N/A] [3:Procedure was tolerated well] Post Debridement Measurements L x N/A [2:N/A] [3:2.2x16.5x0.1] W x D (cm) [1:N/A] [2:N/A] [3:2.851] Post Debridement Volume: (cm) [1:Negative Pressure Wound Therapy] [2:N/A] [3:Debridement] Procedures Performed: [1:Application (NPWT)] Treatment Notes Electronic Signature(s) Signed: 02/28/2021 11:02:28 AM By: Kalman Shan DO Signed: 02/28/2021  6:12:56 PM By: Baruch Gouty RN, BSN Entered By: Kalman Shan on 02/28/2021 10:33:32 -------------------------------------------------------------------------------- Multi-Disciplinary Care Plan Details Patient Name: Date of Service: Kayla Bacon R. 02/28/2021 8:00 A M Medical Record Number: [id number] Patient Account Number: [id number] Date of Birth/Sex: Treating RN: 1946/04/21 (75 y.o. Elam Dutch Primary Care Shauniece Kwan: [name] Other Clinician: Referring Tomie Elko: Treating Bekka Qian/Extender: Betsey Holiday in Treatment: Cecil reviewed with physician Active Inactive Electronic Signature(s) Signed: 06/21/2021 3:07:54 PM By: Baruch Gouty RN, BSN Previous Signature: 02/28/2021 6:12:56 PM Version By: Baruch Gouty RN, BSN Entered By: Baruch Gouty on 04/13/2021 14:21:37 -------------------------------------------------------------------------------- Negative Pressure Wound Therapy Application (NPWT) Details Patient Name: Date of Service: Kayla Harrison, Kayla R. 02/28/2021 8:00 A M Medical Record Number: [id number] Patient Account Number: [id number] Date of Birth/Sex: Treating RN: 08-14-1946 (75 y.o. Elam Dutch Primary Care Cristofer Yaffe: [name] Other Clinician: Referring Zylen Wenig: Treating Travonna Swindle/Extender: Betsey Holiday in Treatment: 4 NPWT Application Performed for: Wound #1 Left, Proximal Trochanter Additional Injuries Covered: No Performed By: Baruch Gouty, RN Type: VAC System Coverage Size (sq cm): 1.12 Pressure Type: Constant Pressure Setting: 125 mmHG Drain Type: None Quantity of Sponges/Gauze Inserted: 1 Sponge/Dressing Type: Foam, Black Date Initiated: 02/07/2021 Response to Treatment: good Post Procedure Diagnosis Same as Pre-procedure Electronic Signature(s) Signed: 02/28/2021 6:12:56 PM By: Baruch Gouty RN, BSN Entered By: Baruch Gouty on  02/28/2021 09:07:40 -------------------------------------------------------------------------------- Pain Assessment Details Patient Name: Date of Service: Kayla Bacon R. 02/28/2021 8:00 A M Medical Record Number: [id number] Patient Account Number: [id number] Date of Birth/Sex: Treating RN: 1946-04-14 (75 y.o. Elam Dutch Primary Care Lavaughn Bisig: [name] Other Clinician: Referring Shourya Macpherson: Treating Tayla Panozzo/Extender: Betsey Holiday in Treatment: 4 Active Problems Location of Pain Severity and Description of Pain Patient Has Paino No Site Locations Pain Management and Medication Current Pain Management: Electronic Signature(s) Signed: 02/28/2021 5:19:11 PM By: Sandre Kitty Signed: 02/28/2021 6:12:56 PM By: Baruch Gouty RN, BSN Entered By: Sandre Kitty on 02/28/2021 08:22:48 -------------------------------------------------------------------------------- Patient/Caregiver Education Details Patient Name: Date of Service: Kayla Harrison 6/8/2022andnbsp8:00 A M Medical Record Number: [id number] Patient Account Number: [id number] Date of Birth/Gender: Treating RN: 1946-07-20 (75 y.o. Elam Dutch Primary Care Physician: [name] Other Clinician: Referring Physician: Treating Physician/Extender: Betsey Holiday in Treatment: 4 Education Assessment Education Provided To: Patient Education Topics Provided Wound/Skin Impairment: Methods: Explain/Verbal Responses: Reinforcements needed, State content correctly [employer]) Signed: 02/28/2021 6:12:56 PM By: Baruch Gouty RN, BSN Entered By: Baruch Gouty on 02/28/2021 17:59:09 -------------------------------------------------------------------------------- Wound Assessment Details Patient Name: Date of Service: Kayla Bacon R. 02/28/2021 8:00 A M Medical Record Number: [id number] Patient Account  Number:  [id number] Date of Birth/Sex: Treating RN: 1946/08/07 (75 y.o. Elam Dutch Primary Care Angelena Sand: [name] Other Clinician: Referring Keen Ewalt: Treating Ridwan Bondy/Extender: Betsey Holiday in Treatment: 4 Wound Status Wound Number: 1 Primary Etiology: Dehisced Wound Wound Location: Left, Proximal Trochanter Wound Status: Open Wounding Event: Trauma Comorbid History: Sleep Apnea, Arrhythmia, Hypotension, Osteoarthritis Date Acquired: 12/15/2020 Weeks Of Treatment: 4 Clustered Wound: No Photos Wound Measurements Length: (cm) 1.6 Width: (cm) 0.7 Depth: (cm) 2 Area: (cm) 0.88 Volume: (cm) 1.759 % Reduction in Area: 66% % Reduction in Volume: 91% Epithelialization: None Tunneling: Yes Position (o'clock): 1 Maximum Distance: (cm) 9 Undermining: Yes Starting Position (o'clock): 12 Ending Position (o'clock): 12 Maximum Distance: (cm) 4 Wound Description Classification: Full Thickness Without Exposed Support Structures Wound Margin: Well defined, not attached Exudate Amount: Large Exudate Type: Serosanguineous Exudate Color: red, brown Foul Odor After Cleansing: No Slough/Fibrino No Wound Bed Granulation Amount: Large (67-100%) Exposed Structure Granulation Quality: Pink Fascia Exposed: No Necrotic Amount: None Present (0%) Fat Layer (Subcutaneous Tissue) Exposed: Yes Tendon Exposed: No Muscle Exposed: No Joint Exposed: No Bone Exposed: No Electronic Signature(s) Signed: 02/28/2021 5:19:11 PM By: Sandre Kitty Signed: 02/28/2021 6:12:56 PM By: Baruch Gouty RN, BSN Entered By: Sandre Kitty on 02/28/2021 16:55:54 -------------------------------------------------------------------------------- Wound Assessment Details Patient Name: Date of Service: Kayla Bacon R. 02/28/2021 8:00 A M Medical Record Number: [id number] Patient Account Number: [id number] Date of Birth/Sex: Treating RN: 1946-06-16 (75 y.o. Elam Dutch Primary Care Kaimana Lurz: [name] Other Clinician: Referring Haislee Corso: Treating Madine Sarr/Extender: Betsey Holiday in Treatment: 4 Wound Status Wound Number: 2 Primary Etiology: Dehisced Wound Wound Location: Left, Distal Trochanter Wound Status: Open Wounding Event: Trauma Comorbid History: Sleep Apnea, Arrhythmia, Hypotension, Osteoarthritis Date Acquired: 12/15/2020 Weeks Of Treatment: 4 Clustered Wound: No Photos Wound Measurements Length: (cm) 0.7 Width: (cm) 0.6 Depth: (cm) 0.1 Area: (cm) 0.33 Volume: (cm) 0.033 % Reduction in Area: 63.1% % Reduction in Volume: 63.3% Epithelialization: None Undermining: No Wound Description Classification: Full Thickness Without Exposed Support Structures Wound Margin: Flat and Intact Exudate Amount: Medium Exudate Type: Serous Exudate Color: amber Foul Odor After Cleansing: No Slough/Fibrino No Wound Bed Granulation Amount: None Present (0%) Exposed Structure Necrotic Amount: Large (67-100%) Fascia Exposed: No Necrotic Quality: Eschar Fat Layer (Subcutaneous Tissue) Exposed: Yes Tendon Exposed: No Muscle Exposed: No Joint Exposed: No Bone Exposed: No Electronic Signature(s) Signed: 02/28/2021 5:19:11 PM By: Sandre Kitty Signed: 02/28/2021 6:12:56 PM By: Baruch Gouty RN, BSN Entered By: Sandre Kitty on 02/28/2021 16:55:36 -------------------------------------------------------------------------------- Wound Assessment Details Patient Name: Date of Service: Kayla Bacon R. 02/28/2021 8:00 A M Medical Record Number: [id number] Patient Account Number: [id number] Date of Birth/Sex: Treating RN: 02/27/1946 (75 y.o. Elam Dutch Primary Care Azhane Eckart: [name] Other Clinician: Referring Ashiah Karpowicz: Treating Ariann Khaimov/Extender: Betsey Holiday in Treatment: 4 Wound Status Wound Number: 3 Primary Etiology: Trauma, Other Wound Location: Left,  Medial Upper Leg Wound Status: Open Wounding Event: Blister Comorbid History: Sleep Apnea, Arrhythmia, Hypotension, Osteoarthritis Date Acquired: 02/14/2021 Weeks Of Treatment: 0 Clustered Wound: No Photos Wound Measurements Length: (cm) 2.2 Width: (cm) 16.5 Depth: (cm) 0.1 Area: (cm) 28.51 Volume: (cm) 2.851 % Reduction in Area: 0% % Reduction in Volume: 0% Epithelialization: None Tunneling: No Undermining: No Wound Description Classification: Full Thickness Without Exposed Support Structures Wound Margin: Flat and Intact Exudate Amount: Medium Exudate Type: Serosanguineous Exudate Color: red, brown Foul Odor After Cleansing: No Slough/Fibrino No Wound Bed Granulation  Amount: Small (1-33%) Exposed Structure Granulation Quality: Pink Fascia Exposed: No Necrotic Amount: Large (67-100%) Fat Layer (Subcutaneous Tissue) Exposed: Yes Necrotic Quality: Adherent Slough Tendon Exposed: No Muscle Exposed: No Joint Exposed: No Bone Exposed: No Electronic Signature(s) Signed: 02/28/2021 5:19:11 PM By: Sandre Kitty Signed: 02/28/2021 6:12:56 PM By: Baruch Gouty RN, BSN Entered By: Sandre Kitty on 02/28/2021 16:56:13 -------------------------------------------------------------------------------- [place] Details Patient Name: Date of Service: Kayla Bacon R. 02/28/2021 8:00 A M Medical Record Number: [id number] Patient Account Number: [id number] Date of Birth/Sex: Treating RN: April 11, 1946 (75 y.o. Elam Dutch Primary Care Trestin Vences: [name] Other Clinician: Referring Judine Arciniega: Treating Dereon Williamsen/Extender: Betsey Holiday in Treatment: 4 Vital Signs Time Taken: 08:22 Temperature (F): 97.8 Height (in): 66 Pulse (bpm): 139 Weight (lbs): 200 Respiratory Rate (breaths/min): 18 Body Mass Index (BMI): 32.3 Blood Pressure (mmHg): 121/88 Reference Range: 80 - 120 mg / dl Electronic Signature(s) Signed: 02/28/2021 5:19:11 PM By:  Sandre Kitty Entered By: Sandre Kitty on 02/28/2021 08:22:41

## 2021-02-28 NOTE — Progress Notes (Signed)
Kayla Harrison, Kayla Harrison ([id number]) Visit Report for 02/28/2021 Chief Complaint Document Details Patient Name: Date of Service: Kayla Harrison, Kayla Harrison 02/28/2021 8:00 A M Medical Record Number: [id number] Patient Account Number: [id number] Date of Birth/Sex: Treating RN: 1946/08/25 (75 y.o. Elam Dutch Primary Care Provider: [name] Other Clinician: Referring Provider: Treating Provider/Extender: Betsey Holiday in Treatment: 4 Information Obtained from: Patient Chief Complaint Left thigh wound Electronic Signature(s) Signed: 02/28/2021 11:02:28 AM By: Kalman Shan DO Entered By: Kalman Shan on 02/28/2021 10:33:51 -------------------------------------------------------------------------------- Debridement Details Patient Name: Date of Service: Kayla Bacon R. 02/28/2021 8:00 A M Medical Record Number: [id number] Patient Account Number: [id number] Date of Birth/Sex: Treating RN: 02-May-1946 (75 y.o. Elam Dutch Primary Care Provider: [name] Other Clinician: Referring Provider: Treating Provider/Extender: Betsey Holiday in Treatment: 4 Debridement Performed for Assessment: Wound #3 Left,Medial Upper Leg Performed By: Clinician Rhae Hammock, RN Debridement Type: Chemical/Enzymatic/Mechanical Agent Used: Santyl Level of Consciousness (Pre-procedure): Awake and Alert Pre-procedure Verification/Time Out No Taken: Bleeding: None Response to Treatment: Procedure was tolerated well Level of Consciousness (Post- Awake and Alert procedure): Post Debridement Measurements of Total Wound Length: (cm) 2.2 Width: (cm) 16.5 Depth: (cm) 0.1 Volume: (cm) 2.851 Character of Wound/Ulcer Post Debridement: Requires Further Debridement Post Procedure Diagnosis Same as Pre-procedure Electronic Signature(s) Signed: 02/28/2021 11:02:28 AM By: Kalman Shan DO Signed: 02/28/2021 6:12:56 PM By: Baruch Gouty  RN, BSN Entered By: Baruch Gouty on 02/28/2021 09:09:07 -------------------------------------------------------------------------------- HPI Details Patient Name: Date of Service: Kayla Bacon R. 02/28/2021 8:00 A M Medical Record Number: [id number] Patient Account Number: [id number] Date of Birth/Sex: Treating RN: 1945-09-29 (75 y.o. Elam Dutch Primary Care Provider: [name] Other Clinician: Referring Provider: Treating Provider/Extender: Betsey Holiday in Treatment: 4 History of Present Illness HPI Description: [name] is a 75 year old female with a past medical history of A. fib on Eliquis, history of breast cancer, orthostatic hypotension, and pacemaker that presents to the clinic for a left hip wound. On 3/25 the patient fell at home and developed a large hematoma to her left hip and she was evaluated by orthopedic surgery and had the hematoma evacuated. The incision site was sutured and dehisced on 4/19. This was again sutured by orthopedic surgery. She states over the past 3 weeks she has had an increase in size of the surrounding tissue with some hardening to the area. She has some increased warmth and erythema to the surrounding tissue. She denies fever/chills, nausea/vomiting or purulent drainage. 5/6; patient has developed a new wound to her left upper thigh caused by an Ace bandage put on too tight and left on for several days while she was hospitalized. Her previous hematoma was evacuated and now has a wound VAC and is being managed by Dr. Lucia Gaskins. She currently denies signs of infection. Electronic Signature(s) Signed: 02/28/2021 11:02:28 AM By: Kalman Shan DO Entered By: Kalman Shan on 02/28/2021 10:34:50 -------------------------------------------------------------------------------- Physical Exam Details Patient Name: Date of Service: Kayla Bacon R. 02/28/2021 8:00 A M Medical Record Number:  [id number] Patient Account Number: [id number] Date of Birth/Sex: Treating RN: 1945/10/06 (75 y.o. Elam Dutch Primary Care Provider: [name] Other Clinician: Referring Provider: Treating Provider/Extender: Betsey Holiday in Treatment: 4 Constitutional respirations regular, non-labored and within target range for patient.Marland Kitchen Psychiatric pleasant and cooperative. Notes Left hip: Incision site with sutures in place and a small opening with tunneling throughout the wound.  No signs of infection. Left upper thigh with Elongated open wound with nonviable tissue present. No signs of infection. Electronic Signature(s) Signed: 02/28/2021 11:02:28 AM By: Kalman Shan DO Entered By: Kalman Shan on 02/28/2021 10:36:08 -------------------------------------------------------------------------------- Physician Orders Details Patient Name: Date of Service: Kayla Bacon R. 02/28/2021 8:00 A M Medical Record Number: [id number] Patient Account Number: [id number] Date of Birth/Sex: Treating RN: [birth year] (75 y.o. Elam Dutch Primary Care Provider: [name] Other Clinician: Referring Provider: Treating Provider/Extender: Betsey Holiday in Treatment: 4 Verbal / Phone Orders: No Diagnosis Coding ICD-10 Coding Code Description S71.102D Unspecified open wound, left thigh, subsequent encounter T81.30XD Disruption of wound, unspecified, subsequent encounter S70.02XD Contusion of left hip, subsequent encounter I48.91 Unspecified atrial fibrillation Z79.01 Long term (current) use of anticoagulants Z85.3 Personal history of malignant neoplasm of breast Follow-up Appointments ppointment in 2 weeks. - with Dr. Heber [place] Return A Bathing/ Shower/ Hygiene May shower with protection but do not get wound dressing(s) wet. Negative Presssure Wound Therapy Wound #1 Left,Proximal Trochanter Wound Vac to wound continuously at  177mm/hg pressure - per Dr. Florina Ou Foam - place sponge in open portion of wound Home Health New wound care orders this week; continue Home Health for wound care. May utilize formulary equivalent dressing for wound treatment orders unless otherwise specified. Dressing changes to be completed by [place] on Monday / Wednesday / Friday except when patient has scheduled visit at [facility]. - home health to apply [facility] even on days when pt has appointment at wound center Other [place] Orders/Instructions: - [facility] Wound Treatment Wound #2 - Trochanter Wound Laterality: Left, Distal Secondary Dressing: ComfortFoam Border, 2x2 in (silicone border) [facility]) Every Other Day/15 Days Discharge Instructions: Apply over primary dressing as directed. Wound #3 - Upper Leg Wound Laterality: Left, Medial Prim Dressing: Santyl Ointment 3 x Per Week/30 Days ary Discharge Instructions: Apply nickel thick amount to wound bed as instructed Secondary Dressing: Woven Gauze Sponge, Non-Sterile 4x4 in 3 x Per Week/30 Days Discharge Instructions: Apply over primary dressing moistened with saline over santyl Secondary Dressing: Zetuvit Plus Silicone Border Dressing 7x7(in/in) (Home Health) 3 x Per Week/30 Days Discharge Instructions: Apply silicone border over primary dressing as directed. Patient Medications llergies: ketamine, rivaroxaban, dofetilide, ranolazine, epinephrine A Notifications Medication Indication Start End 02/28/2021 Santyl DOSE 1 - topical 250 unit/gram ointment - 1 application daily Electronic Signature(s) Signed: 02/28/2021 11:00:12 AM By: Kalman Shan DO Entered By: Kalman Shan on 02/28/2021 11:00:11 -------------------------------------------------------------------------------- Problem List Details Patient Name: Date of Service: Kayla Bacon R. 02/28/2021 8:00 A M Medical Record Number: [id number] Patient Account Number: [id number] Date of  Birth/Sex: Treating RN: 11-Jul-1946 (75 y.o. Elam Dutch Primary Care Provider: [name] Other Clinician: Referring Provider: Treating Provider/Extender: Betsey Holiday in Treatment: 4 Active Problems ICD-10 Encounter Code Description Active Date MDM Diagnosis S71.102D Unspecified open wound, left thigh, subsequent encounter 02/28/2021 No Yes T81.30XD Disruption of wound, unspecified, subsequent encounter 01/31/2021 No Yes S70.02XD Contusion of left hip, subsequent encounter 01/31/2021 No Yes I48.91 Unspecified atrial fibrillation 01/31/2021 No Yes Z79.01 Long term (current) use of anticoagulants 01/31/2021 No Yes Z85.3 Personal history of malignant neoplasm of breast 01/31/2021 No Yes Inactive Problems Resolved Problems Electronic Signature(s) Signed: 02/28/2021 11:02:28 AM By: Kalman Shan DO Entered By: Kalman Shan on 02/28/2021 10:57:11 -------------------------------------------------------------------------------- Progress Note Details Patient Name: Date of Service: Kayla Bacon R. 02/28/2021 8:00 A M Medical Record Number: [id number]  Patient Account Number: [id number] Date of Birth/Sex: Treating RN: 1945/10/21 (75 y.o. Elam Dutch Primary Care Provider: [name] Other Clinician: Referring Provider: Treating Provider/Extender: Betsey Holiday in Treatment: 4 Subjective Chief Complaint Information obtained from Patient Left thigh wound History of Present Illness (HPI) Ms. Blomgren is a 75 year old female with a past medical history of A. fib on Eliquis, history of breast cancer, orthostatic hypotension, and pacemaker that presents to the clinic for a left hip wound. On 3/25 the patient fell at home and developed a large hematoma to her left hip and she was evaluated by orthopedic surgery and had the hematoma evacuated. The incision site was sutured and dehisced on 4/19. This was again sutured  by orthopedic surgery. She states over the past 3 weeks she has had an increase in size of the surrounding tissue with some hardening to the area. She has some increased warmth and erythema to the surrounding tissue. She denies fever/chills, nausea/vomiting or purulent drainage. 5/6; patient has developed a new wound to her left upper thigh caused by an Ace bandage put on too tight and left on for several days while she was hospitalized. Her previous hematoma was evacuated and now has a wound VAC and is being managed by Dr. Lucia Gaskins. She currently denies signs of infection. Patient History Information obtained from Patient. Family History Unknown History. Social History Never smoker, Marital Status - Single, Alcohol Use - Never, Drug Use - No History, Caffeine Use - Never. Medical History Respiratory Patient has history of Sleep Apnea Cardiovascular Patient has history of Arrhythmia - A-Fib, Hypotension - Orthostatic Denies history of Hypertension Musculoskeletal Patient has history of Osteoarthritis Medical A Surgical History Notes nd Endocrine Thyroid Ca, s/p thyroidectomy Oncologic Thyroid cancer Objective Constitutional respirations regular, non-labored and within target range for patient.. Vitals Time Taken: 8:22 AM, Height: 66 in, Weight: 200 lbs, BMI: 32.3, Temperature: 97.8 F, Pulse: 139 bpm, Respiratory Rate: 18 breaths/min, Blood Pressure: 121/88 mmHg. Psychiatric pleasant and cooperative. General Notes: Left hip: Incision site with sutures in place and a small opening with tunneling throughout the wound. No signs of infection. Left upper thigh with Elongated open wound with nonviable tissue present. No signs of infection. Integumentary (Hair, Skin) Wound #1 status is Open. Original cause of wound was Trauma. The date acquired was: 12/15/2020. The wound has been in treatment 4 weeks. The wound is located on the Left,Proximal Trochanter. The wound measures 1.6cm length x  0.7cm width x 2cm depth; 0.88cm^2 area and 1.759cm^3 volume. There is Fat Layer (Subcutaneous Tissue) exposed. Tunneling has been noted at 1:00 with a maximum distance of 9cm. Undermining begins at 12:00 and ends at 12:00 with a maximum distance of 4cm. There is a large amount of serosanguineous drainage noted. The wound margin is well defined and not attached to the wound base. There is large (67-100%) pink granulation within the wound bed. There is no necrotic tissue within the wound bed. Wound #2 status is Open. Original cause of wound was Trauma. The date acquired was: 12/15/2020. The wound has been in treatment 4 weeks. The wound is located on the Left,Distal Trochanter. The wound measures 0.7cm length x 0.6cm width x 0.1cm depth; 0.33cm^2 area and 0.033cm^3 volume. There is Fat Layer (Subcutaneous Tissue) exposed. There is no undermining noted. There is a medium amount of serous drainage noted. The wound margin is flat and intact. There is no granulation within the wound bed. There is a large (67-100%) amount of necrotic tissue within  the wound bed including Eschar. Wound #3 status is Open. Original cause of wound was Blister. The date acquired was: 02/14/2021. The wound is located on the Left,Medial Upper Leg. The wound measures 2.2cm length x 16.5cm width x 0.1cm depth; 28.51cm^2 area and 2.851cm^3 volume. There is Fat Layer (Subcutaneous Tissue) exposed. There is no tunneling or undermining noted. There is a medium amount of serosanguineous drainage noted. The wound margin is flat and intact. There is small (1-33%) pink granulation within the wound bed. There is a large (67-100%) amount of necrotic tissue within the wound bed including Adherent Slough. Assessment Active Problems ICD-10 Unspecified open wound, left thigh, subsequent encounter Disruption of wound, unspecified, subsequent encounter Contusion of left hip, subsequent encounter Unspecified atrial fibrillation Long term (current)  use of anticoagulants Personal history of malignant neoplasm of breast Patient was admitted on 5/16 for an infected hematoma. She had incision and drainage of the wound by Dr. Lucia Gaskins on 5/17 he is currently managing this wound with a wound VAC. Patient developed another wound to her left upper thigh due to an Ace bandage wrap too tightly while she was hospitalized and left on for too long. She has currently been keeping the area covered. It does not appear infected. I recommended Santyl daily to this wound. Procedures Wound #3 Pre-procedure diagnosis of Wound #3 is a Trauma, Other located on the Left,Medial Upper Leg . There was a Chemical/Enzymatic/Mechanical debridement performed by Rhae Hammock, RN.Marland Kitchen Agent used was [employer]. There was no bleeding. The procedure was tolerated well. Post Debridement Measurements: 2.2cm length x 16.5cm width x 0.1cm depth; 2.851cm^3 volume. Character of Wound/Ulcer Post Debridement requires further debridement. Post procedure Diagnosis Wound #3: Same as Pre-Procedure Plan Follow-up Appointments: Return Appointment in 2 weeks. - with Dr. Heber [place] Bathing/ Shower/ Hygiene: May shower with protection but do not get wound dressing(s) wet. Negative Presssure Wound Therapy: Wound #1 Left,Proximal Trochanter: Wound Vac to wound continuously at 120mm/hg pressure - per Dr. Florina Ou Foam - place sponge in open portion of wound Home Health: New wound care orders this week; continue Home Health for wound care. May utilize formulary equivalent dressing for wound treatment orders unless otherwise specified. Dressing changes to be completed by [place] on Monday / Wednesday / Friday except when patient has scheduled visit at [facility] - home health to apply [facility] even on days when pt has appointment at wound center Other [place] Orders/Instructions: - [facility] The following medication(s) was prescribed: Santyl topical 250 unit/gram ointment 1 1  application daily starting 02/28/2021 WOUND #2: - Trochanter Wound Laterality: Left, Distal Secondary Dressing: ComfortFoam Border, 2x2 in (silicone border) [facility]) Every Other Day/15 Days Discharge Instructions: Apply over primary dressing as directed. WOUND #3: - Upper Leg Wound Laterality: Left, Medial Prim Dressing: Santyl Ointment 3 x Per Week/30 Days ary Discharge Instructions: Apply nickel thick amount to wound bed as instructed Secondary Dressing: Woven Gauze Sponge, Non-Sterile 4x4 in 3 x Per Week/30 Days Discharge Instructions: Apply over primary dressing moistened with saline over santyl Secondary Dressing: Zetuvit Plus Silicone Border Dressing 7x7(in/in) (Home Health) 3 x Per Week/30 Days Discharge Instructions: Apply silicone border over primary dressing as directed. 1. Santyl daily 2. Follow-up in 2 weeks 3. Left trochanter wound managed by Dr. Lucia Gaskins Electronic Signature(s) Signed: 02/28/2021 11:02:28 AM By: Kalman Shan DO Entered By: Kalman Shan on 02/28/2021 11:00:37 -------------------------------------------------------------------------------- HxROS Details Patient Name: Date of Service: Lindie Spruce, Lelania R. 02/28/2021 8:00 A M Medical Record Number:  [id number] Patient Account Number: [id number] Date of Birth/Sex: Treating RN: 08-29-1946 (75 y.o. Elam Dutch Primary Care Provider: [name] Other Clinician: Referring Provider: Treating Provider/Extender: Betsey Holiday in Treatment: 4 Information Obtained From Patient Respiratory Medical History: Positive for: Sleep Apnea Cardiovascular Medical History: Positive for: Arrhythmia - A-Fib; Hypotension - Orthostatic Negative for: Hypertension Endocrine Medical History: Past Medical History Notes: Thyroid Ca, s/p thyroidectomy Musculoskeletal Medical History: Positive for: Osteoarthritis Oncologic Medical History: Past Medical History Notes: Thyroid  cancer Immunizations Pneumococcal Vaccine: Received Pneumococcal Vaccination: Yes Implantable Devices None Family and Social History Unknown History: Yes; Never smoker; Marital Status - Single; Alcohol Use: Never; Drug Use: No History; Caffeine Use: Never; Financial Concerns: No; Food, Clothing or Shelter Needs: No; Support System Lacking: No; Transportation Concerns: No Electronic Signature(s) Signed: 02/28/2021 11:02:28 AM By: Kalman Shan DO Signed: 02/28/2021 6:12:56 PM By: Baruch Gouty RN, BSN Entered By: Kalman Shan on 02/28/2021 10:34:58 -------------------------------------------------------------------------------- SuperBill Details Patient Name: Date of Service: Kayla Bacon R. 02/28/2021 Medical Record Number: [id number] Patient Account Number: [id number] Date of Birth/Sex: Treating RN: 1946-06-19 (75 y.o. Elam Dutch Primary Care Provider: [name] Other Clinician: Referring Provider: Treating Provider/Extender: Betsey Holiday in Treatment: 4 Diagnosis Coding ICD-10 Codes Code Description S71.102D Unspecified open wound, left thigh, subsequent encounter T81.30XD Disruption of wound, unspecified, subsequent encounter S70.02XD Contusion of left hip, subsequent encounter I48.91 Unspecified atrial fibrillation Z79.01 Long term (current) use of anticoagulants Z85.3 Personal history of malignant neoplasm of breast Facility Procedures CPT4 Code: 07371062 Description: [phone] - [place] W/O ANES NON SELECT Modifier: Quantity: 1 CPT4 Code: [id number] Description: 09381 - WOUND VAC-50 SQ CM OR LESS Modifier: 57 Quantity: 1 Physician Procedures : CPT4 Code Description Modifier [id number] - WC PHYS LEVEL 3 - EST PT ICD-10 Diagnosis Description S71.102D Unspecified open wound, left thigh, subsequent encounter T81.30XD Disruption of wound, unspecified, subsequent encounter S70.02XD Contusion of  left hip, subsequent  encounter Quantity: 1 Electronic Signature(s) Signed: 02/28/2021 11:02:28 AM By: Kalman Shan DO Entered By: Kalman Shan on 02/28/2021 11:01:29

## 2021-03-01 DIAGNOSIS — I5032 Chronic diastolic (congestive) heart failure: Secondary | ICD-10-CM | POA: Diagnosis not present

## 2021-03-01 DIAGNOSIS — S71002D Unspecified open wound, left hip, subsequent encounter: Secondary | ICD-10-CM | POA: Diagnosis not present

## 2021-03-01 DIAGNOSIS — W19XXXD Unspecified fall, subsequent encounter: Secondary | ICD-10-CM | POA: Diagnosis not present

## 2021-03-01 DIAGNOSIS — S52045D Nondisplaced fracture of coronoid process of left ulna, subsequent encounter for closed fracture with routine healing: Secondary | ICD-10-CM | POA: Diagnosis not present

## 2021-03-01 DIAGNOSIS — I110 Hypertensive heart disease with heart failure: Secondary | ICD-10-CM | POA: Diagnosis not present

## 2021-03-01 DIAGNOSIS — S2241XD Multiple fractures of ribs, right side, subsequent encounter for fracture with routine healing: Secondary | ICD-10-CM | POA: Diagnosis not present

## 2021-03-02 DIAGNOSIS — I5032 Chronic diastolic (congestive) heart failure: Secondary | ICD-10-CM | POA: Diagnosis not present

## 2021-03-02 DIAGNOSIS — S2241XD Multiple fractures of ribs, right side, subsequent encounter for fracture with routine healing: Secondary | ICD-10-CM | POA: Diagnosis not present

## 2021-03-02 DIAGNOSIS — S71002D Unspecified open wound, left hip, subsequent encounter: Secondary | ICD-10-CM | POA: Diagnosis not present

## 2021-03-02 DIAGNOSIS — W19XXXD Unspecified fall, subsequent encounter: Secondary | ICD-10-CM | POA: Diagnosis not present

## 2021-03-02 DIAGNOSIS — S52045D Nondisplaced fracture of coronoid process of left ulna, subsequent encounter for closed fracture with routine healing: Secondary | ICD-10-CM | POA: Diagnosis not present

## 2021-03-02 DIAGNOSIS — I110 Hypertensive heart disease with heart failure: Secondary | ICD-10-CM | POA: Diagnosis not present

## 2021-03-03 DIAGNOSIS — S71002D Unspecified open wound, left hip, subsequent encounter: Secondary | ICD-10-CM | POA: Diagnosis not present

## 2021-03-03 DIAGNOSIS — S2241XD Multiple fractures of ribs, right side, subsequent encounter for fracture with routine healing: Secondary | ICD-10-CM | POA: Diagnosis not present

## 2021-03-03 DIAGNOSIS — I110 Hypertensive heart disease with heart failure: Secondary | ICD-10-CM | POA: Diagnosis not present

## 2021-03-03 DIAGNOSIS — W19XXXD Unspecified fall, subsequent encounter: Secondary | ICD-10-CM | POA: Diagnosis not present

## 2021-03-03 DIAGNOSIS — I5032 Chronic diastolic (congestive) heart failure: Secondary | ICD-10-CM | POA: Diagnosis not present

## 2021-03-03 DIAGNOSIS — S52045D Nondisplaced fracture of coronoid process of left ulna, subsequent encounter for closed fracture with routine healing: Secondary | ICD-10-CM | POA: Diagnosis not present

## 2021-03-05 ENCOUNTER — Other Ambulatory Visit: Payer: Self-pay

## 2021-03-05 ENCOUNTER — Ambulatory Visit (INDEPENDENT_AMBULATORY_CARE_PROVIDER_SITE_OTHER): Payer: Medicare Other

## 2021-03-05 ENCOUNTER — Encounter: Payer: Self-pay | Admitting: Infectious Disease

## 2021-03-05 ENCOUNTER — Ambulatory Visit (INDEPENDENT_AMBULATORY_CARE_PROVIDER_SITE_OTHER): Payer: Medicare Other | Admitting: Infectious Disease

## 2021-03-05 VITALS — BP 138/99 | HR 81

## 2021-03-05 DIAGNOSIS — T148XXA Other injury of unspecified body region, initial encounter: Secondary | ICD-10-CM | POA: Diagnosis not present

## 2021-03-05 DIAGNOSIS — B952 Enterococcus as the cause of diseases classified elsewhere: Secondary | ICD-10-CM | POA: Diagnosis not present

## 2021-03-05 DIAGNOSIS — A499 Bacterial infection, unspecified: Secondary | ICD-10-CM

## 2021-03-05 DIAGNOSIS — L089 Local infection of the skin and subcutaneous tissue, unspecified: Secondary | ICD-10-CM

## 2021-03-05 DIAGNOSIS — Z2239 Carrier of other specified bacterial diseases: Secondary | ICD-10-CM | POA: Diagnosis not present

## 2021-03-05 DIAGNOSIS — I495 Sick sinus syndrome: Secondary | ICD-10-CM

## 2021-03-05 NOTE — Progress Notes (Signed)
Per verbal order from Dr. Tommy Medal, 37 cm PICC removed from right brachial, tip intact. No sutures present. RN confirmed length per chart. Dressing was clean and dry. Petroleum dressing applied. Patient advised no heavy lifting with this arm and to leave dressing in place for 24 hours and not to shower affected arm for 24 hours. Advised patient to call office or seek emergency medical care if dressing becomes soaked with blood, swelling, or sharp pain presents. Advised patient to seek emergent care if develops neurological symptoms, chest pain, or shortness of breath. Patient verbalized understanding and agreement. RN answered patient's questions. Patient tolerated procedure well and RN walked patient to check out. Left message with Gabriel Cirri at [facility] answering service, she states she will pass along message that PICC was removed to nursing.   [place] phone: [phone]  Beryle Flock, RN

## 2021-03-05 NOTE — Progress Notes (Signed)
Chief complaint: For polymicrobial wound infection   Patient ID: Kayla Harrison, female    DOB: 1946/02/08, 75 y.o.   MRN: [id number]  HPI  75 y.o. female with polymicrobial soft tissue abscess with Enterococcus faecalis E. coli and multidrug resistant Acinetobacter isolated on culture.   Her abscess cavity was quite large and still the residual hematoma and concerned that there still was residual infection prompting our extending her IV antibiotics beyond her hospital duration which was already more than 2 weeks for potentially 3 weeks as an outpatient.  She is gotten through now 10 days of outpatient Unasyn.  She has seen Dr. Mikey Bussing with wound care and also seen Dr. Susa Simmonds with Orthopedics and they are both pleased with progress of her wound.  Both have felt that there is no evidence of infection this point time and the patient told me that Dr. Susa Simmonds believed that she could come off her IV antibiotics.   Past Medical History:  Diagnosis Date   Anemia    Arthritis    osteoarthritis - knees and right shoulder   Blood transfusion without reported diagnosis    Breast cancer (HCC)    Dr Jamey Ripa, total thyroidectomy- 1999- for cancer   Brucellosis 1964   Chronic bilateral pleural effusions    Colon polyp    Dr Kinnie Scales   Complication of anesthesia    Ketamine produces LSD reaction, bright colored nightmarish experience    Dyslipidemia    Endometriosis    Fibroids    H/O pleural effusion    s/p thoracentesis w withdrawn   Hepatitis    [place] ?hepatitis    History of dysphagia    due to radiation therapy   History of hiatal hernia    small noted on PET scan   Hypertension    Hypothyroidism    Lung cancer, lower lobe (HCC) 01/2017   radiation RX completed 03/04/17; will start chemo 6/27, pt unaware of lung cancer   Morbid obesity (HCC)    Status post lap band surgery   Nephrolithiasis    Non Hodgkin's lymphoma (HCC)    on  chemotherapy   Persistent atrial fibrillation (HCC)    a. s/p PVI 2008 b. s/p convergent ablation 2014 complicated by bradycardia requiring pacemaker implant   Personal history of radiation therapy    Presence of permanent cardiac pacemaker    Rotator cuff tear    Right   Stroke (HCC)    2003- [place] x2   SVC syndrome    with lung mass and non hodgkins lymphoma   Thyroid cancer (HCC) 2000    Past Surgical History:  Procedure Laterality Date   ABDOMINAL HYSTERECTOMY  1983   afib ablation     a. 2008 PVI b. 2014 convergent ablation   APPENDECTOMY     BONE MARROW BIOPSY  02/21/2017   BREAST LUMPECTOMY Left 2010   bso  1998   CARDIAC CATHETERIZATION     2015- negative   CARDIOVERSION  10/09/2012   Procedure: CARDIOVERSION;  Surgeon: Rollene Rotunda, MD;  Location: [facility] OR;  Service: Cardiovascular;  Laterality: N/A;   CARDIOVERSION  10/09/2012   Procedure: CARDIOVERSION;  Surgeon: Rollene Rotunda, MD;  Location: [facility] ENDOSCOPY;  Service: Cardiovascular;  Laterality: N/A;  Kathlene November gave the ok to add pt to the add on , but we must check to find out if the can add pt on at [phone] ( [phone])   CARDIOVERSION N/A 11/20/2012  Procedure: CARDIOVERSION;  Surgeon: Fay Records, MD;  Location: [place];  Service: Cardiovascular;  Laterality: N/A;   CARDIOVERSION N/A 07/18/2017   Procedure: CARDIOVERSION;  Surgeon: Thayer Headings, MD;  Location: [facility] ENDOSCOPY;  Service: Cardiovascular;  Laterality: N/A;   CARDIOVERSION N/A 10/03/2017   Procedure: CARDIOVERSION;  Surgeon: Sanda Klein, MD;  Location: [facility] ENDOSCOPY;  Service: Cardiovascular;  Laterality: N/A;   CARDIOVERSION N/A 01/07/2018   Procedure: CARDIOVERSION;  Surgeon: Acie Fredrickson Wonda Cheng, MD;  Location: [facility] ENDOSCOPY;  Service: Cardiovascular;  Laterality: N/A;   CARDIOVERSION N/A 12/10/2019   Procedure: CARDIOVERSION;  Surgeon: Buford Dresser, MD;  Location: [place];  Service: Cardiovascular;  Laterality: N/A;   CHOLECYSTECTOMY      COLONOSCOPY W/ POLYPECTOMY     Dr Earlean Shawl   CYSTOSCOPY N/A 02/06/2015   Procedure: CYSTOSCOPY;  Surgeon: Kathie Rhodes, MD;  Location: [facility] ORS;  Service: Urology;  Laterality: N/A;   CYSTOSCOPY W/ RETROGRADES Left 11/17/2017   Procedure: CYSTOSCOPY WITH RETROGRADE /PYELOGRAM/;  Surgeon: Kathie Rhodes, MD;  Location: [facility] ORS;  Service: Urology;  Laterality: Left;   CYSTOSCOPY WITH RETROGRADE PYELOGRAM, URETEROSCOPY AND STENT PLACEMENT Right 02/06/2015   Procedure: RETROGRADE PYELOGRAM, RIGHT URETEROSCOPY STENT PLACEMENT;  Surgeon: Kathie Rhodes, MD;  Location: [facility] ORS;  Service: Urology;  Laterality: Right;   CYSTOSCOPY WITH RETROGRADE PYELOGRAM, URETEROSCOPY AND STENT PLACEMENT Right 03/07/2017   Procedure: CYSTOSCOPY WITH RIGHT RETROGRADE PYELOGRAM,RIGHT  URETEROSCOPYLASER LITHOTRIPSY  AND STENT PLACEMENT AND STONE BASKETRY;  Surgeon: Kathie Rhodes, MD;  Location: [place];  Service: Urology;  Laterality: Right;   EYE SURGERY     cataract surgery   fatty mass removal  1999   pubic area   HOLMIUM LASER APPLICATION N/A 4/88/8916   Procedure: HOLMIUM LASER APPLICATION;  Surgeon: Kathie Rhodes, MD;  Location: [facility] ORS;  Service: Urology;  Laterality: N/A;   HOLMIUM LASER APPLICATION Right 9/45/0388   Procedure: HOLMIUM LASER APPLICATION;  Surgeon: Kathie Rhodes, MD;  Location: [facility];  Service: Urology;  Laterality: Right;   HOLMIUM LASER APPLICATION Left 05/20/33   Procedure: HOLMIUM LASER APPLICATION;  Surgeon: Kathie Rhodes, MD;  Location: [facility] ORS;  Service: Urology;  Laterality: Left;   I & D EXTREMITY Left 12/19/2020   Procedure: IRRIGATION AND DEBRIDEMENT OF LEFT HIP HEMATOMA WITH APPLICATION OF WOUND VAC;  Surgeon: Erle Crocker, MD;  Location: [place];  Service: Orthopedics;  Laterality: Left;   I & D EXTREMITY Left 02/06/2021   Procedure: IRRIGATION AND DEBRIDEMENT DEEP ABCESS LEFT THIGH, SECONDARY CLOSURE OF WOUND DEHISCENCE;  Surgeon: Erle Crocker,  MD;  Location: [place];  Service: Orthopedics;  Laterality: Left;   IR FLUORO GUIDE PORT INSERTION RIGHT  02/24/2017   IR NEPHROSTOMY PLACEMENT RIGHT  11/17/2017   IR PATIENT EVAL TECH 0-60 MINS  03/11/2017   IR REMOVAL TUN ACCESS W/ PORT W/O FL MOD SED  04/20/2018   IR US GUIDE VASC ACCESS RIGHT  02/24/2017   KNEE ARTHROSCOPY     bilateral   LAPAROSCOPIC GASTRIC BANDING  07/10/2010   LAPAROSCOPIC GASTRIC BANDING     Laparoscopic adjustable banding APS System with posterior hiatal hernia, 2 suture.   LAPAROTOMY     for ruptured ovary and ovarian artery    NEPHROLITHOTOMY Right 11/17/2017   Procedure: NEPHROLITHOTOMY PERCUTANEOUS;  Surgeon: Kathie Rhodes, MD;  Location: [facility]S;  Service: Urology;  Laterality: Right;   PACEMAKER INSERTION  03/10/2013   MDT dual chamber PPM   POCKET REVISION N/A 12/08/2013  Procedure: POCKET REVISION;  Surgeon: Deboraha Sprang, MD;  Location: [facility] CATH LAB;  Service: Cardiovascular;  Laterality: N/A;   PORTA CATH INSERTION     REVERSE SHOULDER ARTHROPLASTY Right 05/14/2018   Procedure: RIGHT REVERSE SHOULDER ARTHROPLASTY;  Surgeon: Tania Ade, MD;  Location: [place];  Service: Orthopedics;  Laterality: Right;   REVERSE SHOULDER REPLACEMENT Right 05/14/2018   RIGHT HEART CATH N/A 07/21/2019   Procedure: RIGHT HEART CATH;  Surgeon: Larey Dresser, MD;  Location: [place] CV LAB;  Service: Cardiovascular;  Laterality: N/A;   TEE WITH CARDIOVERSION  09/22/2017   TEE WITHOUT CARDIOVERSION N/A 10/03/2017   Procedure: TRANSESOPHAGEAL ECHOCARDIOGRAM (TEE);  Surgeon: Sanda Klein, MD;  Location: [facility] ENDOSCOPY;  Service: Cardiovascular;  Laterality: N/A;   TEE WITHOUT CARDIOVERSION N/A 08/04/2019   Procedure: TRANSESOPHAGEAL ECHOCARDIOGRAM (TEE);  Surgeon: Larey Dresser, MD;  Location: [facility] ENDOSCOPY;  Service: Cardiovascular;  Laterality: N/A;   TEE WITHOUT CARDIOVERSION N/A 12/10/2019   Procedure: TRANSESOPHAGEAL ECHOCARDIOGRAM (TEE);  Surgeon: Buford Dresser, MD;  Location: [place];  Service: Cardiovascular;  Laterality: N/A;   THYROIDECTOMY  1998   Dr Margot Chimes   TONSILLECTOMY     TOTAL KNEE ARTHROPLASTY  04/13/2012   Procedure: TOTAL KNEE ARTHROPLASTY;  Surgeon: Rudean Haskell, MD;  Location: [place];  Service: Orthopedics;  Laterality: Right;   VIDEO BRONCHOSCOPY WITH ENDOBRONCHIAL ULTRASOUND N/A 02/07/2017   Procedure: VIDEO BRONCHOSCOPY WITH ENDOBRONCHIAL ULTRASOUND;  Surgeon: Marshell Garfinkel, MD;  Location: [place];  Service: Pulmonary;  Laterality: N/A;    Family History  Problem Relation Age of Onset   Heart disease Father 10       MI $R'@autopsy'lt$    Colon cancer Father        COLON   Heart attack Father    Other Mother        temporal arteritis    Diabetes Sister    Diabetes Brother    Diabetes Paternal Aunt    Diabetes Paternal Grandmother    Esophageal cancer Neg Hx    Inflammatory bowel disease Neg Hx    Liver disease Neg Hx    Pancreatic cancer Neg Hx    Stomach cancer Neg Hx       Social History   Socioeconomic History   Marital status: Single    Spouse name: Not on file   Number of children: 0   Years of education: Not on file   Highest education level: Master's degree (e.g., MA, MS, MEng, MEd, MSW, MBA)  Occupational History   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    Employer: RETIRED  Tobacco Use   Smoking status: Never   Smokeless tobacco: Never  Vaping Use   Vaping Use: Never used  Substance and Sexual Activity   Alcohol use: No    Comment: none since 1990   Drug use: Never   Sexual activity: Not Currently    Birth control/protection: Surgical  Other Topics Concern   Not on file  Social History Narrative   ** Merged History Encounter **       SINGLE  NO CHILDREN NEVER SMOKED EXERCISE 3 X WK RETIRED NO CAFFEINE      Social Determinants of Health   Financial Resource Strain: Not on file  Food Insecurity: Not on file  Transportation Needs: Not on file  Physical  Activity: Not on file  Stress: Not on file  Social Connections: Not on file    Allergies  Allergen Reactions   Dofetilide Other (See  Comments)    Prolonged QT interval    Ranolazine     Balance issues   Rivaroxaban Other (See Comments)    Nose Bleed X 6 hrs ; packing in ER Nasal hemorrhage   Rivaroxaban     Nose Bleed X 6 hrs ; packing in ER Nasal hemorrhage   Tape Other (See Comments)    SKIN IS THIN AND TEARS EASILY    Tikosyn [Dofetilide] Other (See Comments)    "Long QT wave"    Epinephrine Other (See Comments)    Oral anesthetic Dental form only (liquid). Patient stated she will become "out of it" she can hear you but cannot respond in normal fashion. "not fully with it"   Epinephrine    Ketamine Other (See Comments)    Hallucinations    Adhesive [Tape] Rash    Paper tape as well   Ketamine Other (See Comments)    HALLUCINATIONS     Current Outpatient Medications:    amiodarone (PACERONE) 200 MG tablet, Take 2 tablets (400 mg total) by mouth daily., Disp: 90 tablet, Rfl: 3   ampicillin-sulbactam (UNASYN) IVPB, Inject 3 g into the vein every 6 (six) hours for 21 days. Indication:  Left thigh abscess First Dose: Yes Last Day of Therapy:  03/14/21 Labs - Once weekly:  CBC/D and BMP, Labs - Every other week:  ESR and CRP Method of administration: Mini-Bag Plus / Gravity Method of administration may be changed at the discretion of home infusion pharmacist based upon assessment of the patient and/or caregiver's ability to self-administer the medication ordered., Disp: 84 Units, Rfl: 0   furosemide (LASIX) 20 MG tablet, Take 20 mg by mouth daily as needed for fluid or edema. , Disp: , Rfl:    levothyroxine (SYNTHROID) 137 MCG tablet, Take 68.5-137 mcg by mouth See admin instructions. Taking 137 mcg tablet daily except on Saturday taking 1/2 tablet, Disp: , Rfl:    rosuvastatin (CRESTOR) 10 MG tablet, Take 10 mg by mouth 2 (two) times a week. Takes on Monday and Thursday,  Disp: , Rfl:    acetaminophen (TYLENOL) 325 MG tablet, Take 2 tablets (650 mg total) by mouth every 4 (four) hours as needed for mild pain. (Patient not taking: Reported on 03/05/2021), Disp: , Rfl:    docusate sodium (COLACE) 100 MG capsule, Take 1 capsule (100 mg total) by mouth 2 (two) times daily. For constipation--can adjust as needed. Purchase OTC (Patient not taking: Reported on 03/05/2021), Disp: 60 capsule, Rfl: 0   HYDROcodone-acetaminophen (NORCO/VICODIN) 5-325 MG tablet, Take 1 tablet by mouth 2 (two) times daily as needed for severe pain., Disp: 14 tablet, Rfl: 0   nystatin (MYCOSTATIN/NYSTOP) powder, Apply 1 application topically 3 (three) times daily., Disp: 60 g, Rfl: 1   senna (SENOKOT) 8.6 MG TABS tablet, Take 2 tablets (17.2 mg total) by mouth at bedtime. For constipation--adjust as needed. Purchase OTC, Disp: 120 tablet, Rfl: 0   temazepam (RESTORIL) 7.5 MG capsule, Take 1 capsule (7.5 mg total) by mouth at bedtime as needed for sleep. (Patient not taking: Reported on 03/05/2021), Disp: 30 capsule, Rfl: 0   Review of Systems  Constitutional:  Negative for chills and fever.  HENT:  Negative for congestion and sore throat.   Eyes:  Negative for photophobia.  Respiratory:  Negative for cough, shortness of breath and wheezing.   Cardiovascular:  Negative for chest pain, palpitations and leg swelling.  Gastrointestinal:  Negative for abdominal pain, blood in stool, constipation, diarrhea, nausea and  vomiting.  Genitourinary:  Negative for dysuria, flank pain and hematuria.  Musculoskeletal:  Negative for back pain and myalgias.  Skin:  Positive for wound. Negative for rash.  Neurological:  Negative for dizziness, weakness and headaches.  Hematological:  Does not bruise/bleed easily.  Psychiatric/Behavioral:  Negative for agitation, behavioral problems, confusion, decreased concentration, hallucinations, self-injury and suicidal ideas. The patient is not nervous/anxious.        Objective:   Physical Exam Constitutional:      General: She is not in acute distress.    Appearance: She is not diaphoretic.  HENT:     Head: Normocephalic and atraumatic.     Right Ear: External ear normal.     Left Ear: External ear normal.     Nose: Nose normal.     Mouth/Throat:     Pharynx: No oropharyngeal exudate.  Eyes:     General: No scleral icterus.       Right eye: No discharge.        Left eye: No discharge.     Extraocular Movements: Extraocular movements intact.     Conjunctiva/sclera: Conjunctivae normal.  Cardiovascular:     Rate and Rhythm: Normal rate and regular rhythm.     Heart sounds:    No friction rub.  Pulmonary:     Effort: Pulmonary effort is normal. No respiratory distress.     Breath sounds: No wheezing or rales.  Abdominal:     General: There is no distension.     Palpations: Abdomen is soft.     Tenderness: There is no rebound.  Musculoskeletal:     Cervical back: Normal range of motion and neck supple.  Lymphadenopathy:     Cervical: No cervical adenopathy.  Skin:    General: Skin is warm and dry.     Coloration: Skin is not jaundiced or pale.     Findings: No erythema, lesion or rash.  Neurological:     General: No focal deficit present.     Mental Status: She is alert and oriented to person, place, and time.     Coordination: Coordination normal.  Psychiatric:        Mood and Affect: Mood normal.        Behavior: Behavior normal.        Thought Content: Thought content normal.        Judgment: Judgment normal.   Left hip with vacuum dressing in place.  PICC is clean dry and intact       Assessment & Plan:   Polymicrobial wound infection at site of prior bleed:  Given that her orthopedic surgeon and wound care doctor are both happy with how she has done and she is asked received an extended course of IV antibiotics we will stop antibiotics today and pull the PICC line.  She will follow-up closely with wound care and  orthopedics.  If she needs to be seen again we are happy to see her.  Otherwise she can follow-up as needed.  I spent more than 30 minutes with the patient including greater than 50% of time in face to face counseling of the patient personally reviewing radiographs, along with pertinent laboratory microbiological data review of medical records and in coordination of her care.

## 2021-03-06 ENCOUNTER — Telehealth: Payer: Self-pay

## 2021-03-06 DIAGNOSIS — S2241XD Multiple fractures of ribs, right side, subsequent encounter for fracture with routine healing: Secondary | ICD-10-CM | POA: Diagnosis not present

## 2021-03-06 DIAGNOSIS — W19XXXD Unspecified fall, subsequent encounter: Secondary | ICD-10-CM | POA: Diagnosis not present

## 2021-03-06 DIAGNOSIS — I110 Hypertensive heart disease with heart failure: Secondary | ICD-10-CM | POA: Diagnosis not present

## 2021-03-06 DIAGNOSIS — S71002D Unspecified open wound, left hip, subsequent encounter: Secondary | ICD-10-CM | POA: Diagnosis not present

## 2021-03-06 DIAGNOSIS — S52045D Nondisplaced fracture of coronoid process of left ulna, subsequent encounter for closed fracture with routine healing: Secondary | ICD-10-CM | POA: Diagnosis not present

## 2021-03-06 DIAGNOSIS — I5032 Chronic diastolic (congestive) heart failure: Secondary | ICD-10-CM | POA: Diagnosis not present

## 2021-03-06 NOTE — Telephone Encounter (Signed)
Spoke with pt and reviewed TEE/Cardioversion Instruction sheet with pt.  See letter for complete details. Pt verbalizes understanding and agrees with current plan.

## 2021-03-06 NOTE — Telephone Encounter (Signed)
RN spoke with Kayla Harrison at [facility] to let her know patient's PICC was pulled in the office yesterday.   Beryle Flock, RN

## 2021-03-07 DIAGNOSIS — I110 Hypertensive heart disease with heart failure: Secondary | ICD-10-CM | POA: Diagnosis not present

## 2021-03-07 DIAGNOSIS — S52045D Nondisplaced fracture of coronoid process of left ulna, subsequent encounter for closed fracture with routine healing: Secondary | ICD-10-CM | POA: Diagnosis not present

## 2021-03-07 DIAGNOSIS — W19XXXD Unspecified fall, subsequent encounter: Secondary | ICD-10-CM | POA: Diagnosis not present

## 2021-03-07 DIAGNOSIS — S71002D Unspecified open wound, left hip, subsequent encounter: Secondary | ICD-10-CM | POA: Diagnosis not present

## 2021-03-07 DIAGNOSIS — S2241XD Multiple fractures of ribs, right side, subsequent encounter for fracture with routine healing: Secondary | ICD-10-CM | POA: Diagnosis not present

## 2021-03-07 DIAGNOSIS — I5032 Chronic diastolic (congestive) heart failure: Secondary | ICD-10-CM | POA: Diagnosis not present

## 2021-03-08 ENCOUNTER — Encounter (HOSPITAL_COMMUNITY): Payer: Self-pay | Admitting: Internal Medicine

## 2021-03-08 ENCOUNTER — Other Ambulatory Visit: Payer: Self-pay | Admitting: Internal Medicine

## 2021-03-08 DIAGNOSIS — S2241XD Multiple fractures of ribs, right side, subsequent encounter for fracture with routine healing: Secondary | ICD-10-CM | POA: Diagnosis not present

## 2021-03-08 DIAGNOSIS — S71002D Unspecified open wound, left hip, subsequent encounter: Secondary | ICD-10-CM | POA: Diagnosis not present

## 2021-03-08 DIAGNOSIS — W19XXXD Unspecified fall, subsequent encounter: Secondary | ICD-10-CM | POA: Diagnosis not present

## 2021-03-08 DIAGNOSIS — I4819 Other persistent atrial fibrillation: Secondary | ICD-10-CM

## 2021-03-08 DIAGNOSIS — S52045D Nondisplaced fracture of coronoid process of left ulna, subsequent encounter for closed fracture with routine healing: Secondary | ICD-10-CM | POA: Diagnosis not present

## 2021-03-08 DIAGNOSIS — I110 Hypertensive heart disease with heart failure: Secondary | ICD-10-CM | POA: Diagnosis not present

## 2021-03-08 DIAGNOSIS — I5032 Chronic diastolic (congestive) heart failure: Secondary | ICD-10-CM | POA: Diagnosis not present

## 2021-03-08 LAB — CUP PACEART REMOTE DEVICE CHECK
Battery Remaining Longevity: 26 mo
Battery Voltage: 2.94 V
Brady Statistic AP VP Percent: 0.72 %
Brady Statistic AP VS Percent: 38.29 %
Brady Statistic AS VP Percent: 0.45 %
Brady Statistic AS VS Percent: 60.54 %
Brady Statistic RA Percent Paced: 37.75 %
Brady Statistic RV Percent Paced: 1.17 %
Date Time Interrogation Session: 20220614150609
Implantable Lead Implant Date: 20140618
Implantable Lead Implant Date: 20140618
Implantable Lead Location: 753859
Implantable Lead Location: 753860
Implantable Pulse Generator Implant Date: 20140618
Lead Channel Impedance Value: 1007 Ohm
Lead Channel Impedance Value: 323 Ohm
Lead Channel Impedance Value: 399 Ohm
Lead Channel Impedance Value: 950 Ohm
Lead Channel Pacing Threshold Amplitude: 1.125 V
Lead Channel Pacing Threshold Amplitude: 1.25 V
Lead Channel Pacing Threshold Pulse Width: 0.4 ms
Lead Channel Pacing Threshold Pulse Width: 0.4 ms
Lead Channel Sensing Intrinsic Amplitude: 1 mV
Lead Channel Sensing Intrinsic Amplitude: 1 mV
Lead Channel Sensing Intrinsic Amplitude: 9.375 mV
Lead Channel Sensing Intrinsic Amplitude: 9.375 mV
Lead Channel Setting Pacing Amplitude: 2.5 V
Lead Channel Setting Pacing Amplitude: 2.5 V
Lead Channel Setting Pacing Pulse Width: 0.4 ms
Lead Channel Setting Sensing Sensitivity: 0.9 mV

## 2021-03-08 NOTE — Anesthesia Preprocedure Evaluation (Addendum)
Anesthesia Evaluation  Patient identified by MRN, date of birth, ID band Patient awake    Reviewed: Allergy & Precautions, NPO status , Patient's Chart, lab work & pertinent test results, reviewed documented beta blocker date and time   History of Anesthesia Complications (+) history of anesthetic complications  Airway Mallampati: III  TM Distance: >3 FB Neck ROM: Full    Dental no notable dental hx. (+) Teeth Intact, Caps, Dental Advisory Given   Pulmonary shortness of breath and with exertion, sleep apnea and Continuous Positive Airway Pressure Ventilation ,  Lung Ca LL   Pulmonary exam normal breath sounds clear to auscultation       Cardiovascular hypertension, Pt. on medications + dysrhythmias Atrial Fibrillation + pacemaker  Rhythm:Regular Rate:Tachycardia  EKG 02/14/21 Atrial fibrillation  Echo 12/10/19 1. TEE without evidence of thrombus. Proceeded to successful cardioversion.  2. Left ventricular ejection fraction, by estimation, is 55 to 60%. The left ventricle has normal function. The left ventricle has no regional wall motion abnormalities. Left ventricular diastolic function could not be evaluated.  3. Right ventricular systolic function is normal. The right ventricular size is normal. There is moderately elevated pulmonary artery systolic pressure.  4. No left atrial/left atrial appendage thrombus was detected.  5. The mitral valve is degenerative. Mild mitral valve regurgitation. No evidence of mitral stenosis.  6. Tricuspid valve regurgitation is mild to moderate.  7. The aortic valve is tricuspid. Aortic valve regurgitation is trivial. No aortic stenosis is present.  8. There is mild (Grade II) plaque involving the descending aorta   Cardiac Cath 07/21/19 1. Elevated PCWP with prominent V waves. 2. Moderate pulmonary venous hypertension. 3. Normal right-sided filling pressure.  This looks like either  significant LV diastolic dysfunction or mitral valve disease. I looked at her TTE, neither mitral regurgitation nor mitral stenosis looks markedly significant.  However, I think that a TEE for closer look at mitral valve would be in order.  On Amiodarone- last dose this am  S/P multiple cardioversions and ablations   Neuro/Psych  Neuromuscular disease CVA negative psych ROS   GI/Hepatic hiatal hernia, (+) Hepatitis -Hx/o brucellosis as a teen Dysphagia S/P lap gastric band   Endo/Other  Hypothyroidism Obesity  Renal/GU Renal diseaseHx/o renal calculi  negative genitourinary   Musculoskeletal  (+) Arthritis , Osteoarthritis,    Abdominal   Peds  Hematology  (+) anemia , Hx/o NHL S/P RT Eliquis therapy-last dose this am    Anesthesia Other Findings   Reproductive/Obstetrics                           Anesthesia Physical Anesthesia Plan  ASA: 3  Anesthesia Plan: General   Post-op Pain Management:    Induction: Intravenous  PONV Risk Score and Plan: 3 and Propofol infusion and Treatment may vary due to age or medical condition  Airway Management Planned: Mask, Natural Airway and Nasal Cannula  Additional Equipment:   Intra-op Plan:   Post-operative Plan:   Informed Consent: I have reviewed the patients History and Physical, chart, labs and discussed the procedure including the risks, benefits and alternatives for the proposed anesthesia with the patient or authorized representative who has indicated his/her understanding and acceptance.     Dental advisory given  Plan Discussed with: CRNA and Anesthesiologist  Anesthesia Plan Comments: (Patient in regular rhythm this am. Connected to monitor, no discernable P waves noted. Will obtain 12 lead EKG.)  Anesthesia Quick Evaluation

## 2021-03-09 ENCOUNTER — Encounter (HOSPITAL_COMMUNITY)
Admission: RE | Disposition: A | Discharge status: Home / Self Care | Payer: Self-pay | Source: Home / Self Care | Attending: Internal Medicine

## 2021-03-09 ENCOUNTER — Ambulatory Visit (HOSPITAL_BASED_OUTPATIENT_CLINIC_OR_DEPARTMENT_OTHER)
Admission: RE | Admit: 2021-03-09 | Discharge: 2021-03-09 | Disposition: A | Discharge status: Home / Self Care | Payer: Medicare Other | Source: Home / Self Care | Attending: Internal Medicine | Admitting: Internal Medicine

## 2021-03-09 ENCOUNTER — Ambulatory Visit (HOSPITAL_COMMUNITY): Payer: Medicare Other | Admitting: Anesthesiology

## 2021-03-09 ENCOUNTER — Ambulatory Visit (HOSPITAL_COMMUNITY)
Admission: RE | Admit: 2021-03-09 | Discharge: 2021-03-09 | Disposition: A | Discharge status: Home / Self Care | Payer: Medicare Other | Attending: Internal Medicine | Admitting: Internal Medicine

## 2021-03-09 ENCOUNTER — Encounter (HOSPITAL_COMMUNITY): Payer: Self-pay | Admitting: Internal Medicine

## 2021-03-09 DIAGNOSIS — Z8673 Personal history of transient ischemic attack (TIA), and cerebral infarction without residual deficits: Secondary | ICD-10-CM | POA: Diagnosis not present

## 2021-03-09 DIAGNOSIS — I081 Rheumatic disorders of both mitral and tricuspid valves: Secondary | ICD-10-CM | POA: Diagnosis present

## 2021-03-09 DIAGNOSIS — Z950 Presence of cardiac pacemaker: Secondary | ICD-10-CM | POA: Diagnosis not present

## 2021-03-09 DIAGNOSIS — Z923 Personal history of irradiation: Secondary | ICD-10-CM | POA: Diagnosis not present

## 2021-03-09 DIAGNOSIS — I4819 Other persistent atrial fibrillation: Secondary | ICD-10-CM

## 2021-03-09 DIAGNOSIS — S2241XD Multiple fractures of ribs, right side, subsequent encounter for fracture with routine healing: Secondary | ICD-10-CM | POA: Diagnosis not present

## 2021-03-09 DIAGNOSIS — I4892 Unspecified atrial flutter: Secondary | ICD-10-CM | POA: Diagnosis not present

## 2021-03-09 DIAGNOSIS — Z833 Family history of diabetes mellitus: Secondary | ICD-10-CM | POA: Insufficient documentation

## 2021-03-09 DIAGNOSIS — Z85850 Personal history of malignant neoplasm of thyroid: Secondary | ICD-10-CM | POA: Diagnosis not present

## 2021-03-09 DIAGNOSIS — I10 Essential (primary) hypertension: Secondary | ICD-10-CM | POA: Insufficient documentation

## 2021-03-09 DIAGNOSIS — I342 Nonrheumatic mitral (valve) stenosis: Secondary | ICD-10-CM

## 2021-03-09 DIAGNOSIS — Z8249 Family history of ischemic heart disease and other diseases of the circulatory system: Secondary | ICD-10-CM | POA: Insufficient documentation

## 2021-03-09 DIAGNOSIS — S52045D Nondisplaced fracture of coronoid process of left ulna, subsequent encounter for closed fracture with routine healing: Secondary | ICD-10-CM | POA: Diagnosis not present

## 2021-03-09 DIAGNOSIS — Z7901 Long term (current) use of anticoagulants: Secondary | ICD-10-CM | POA: Diagnosis not present

## 2021-03-09 DIAGNOSIS — W19XXXD Unspecified fall, subsequent encounter: Secondary | ICD-10-CM | POA: Diagnosis not present

## 2021-03-09 DIAGNOSIS — I340 Nonrheumatic mitral (valve) insufficiency: Secondary | ICD-10-CM | POA: Diagnosis not present

## 2021-03-09 DIAGNOSIS — I4891 Unspecified atrial fibrillation: Secondary | ICD-10-CM | POA: Diagnosis not present

## 2021-03-09 DIAGNOSIS — Z888 Allergy status to other drugs, medicaments and biological substances status: Secondary | ICD-10-CM | POA: Diagnosis not present

## 2021-03-09 DIAGNOSIS — E039 Hypothyroidism, unspecified: Secondary | ICD-10-CM | POA: Diagnosis not present

## 2021-03-09 DIAGNOSIS — I083 Combined rheumatic disorders of mitral, aortic and tricuspid valves: Secondary | ICD-10-CM | POA: Diagnosis not present

## 2021-03-09 DIAGNOSIS — Z79899 Other long term (current) drug therapy: Secondary | ICD-10-CM | POA: Insufficient documentation

## 2021-03-09 DIAGNOSIS — I5032 Chronic diastolic (congestive) heart failure: Secondary | ICD-10-CM | POA: Diagnosis not present

## 2021-03-09 DIAGNOSIS — G4733 Obstructive sleep apnea (adult) (pediatric): Secondary | ICD-10-CM | POA: Diagnosis not present

## 2021-03-09 DIAGNOSIS — Z8572 Personal history of non-Hodgkin lymphomas: Secondary | ICD-10-CM | POA: Insufficient documentation

## 2021-03-09 DIAGNOSIS — I110 Hypertensive heart disease with heart failure: Secondary | ICD-10-CM | POA: Diagnosis not present

## 2021-03-09 DIAGNOSIS — Z884 Allergy status to anesthetic agent status: Secondary | ICD-10-CM | POA: Diagnosis not present

## 2021-03-09 DIAGNOSIS — D638 Anemia in other chronic diseases classified elsewhere: Secondary | ICD-10-CM | POA: Diagnosis not present

## 2021-03-09 DIAGNOSIS — S71002D Unspecified open wound, left hip, subsequent encounter: Secondary | ICD-10-CM | POA: Diagnosis not present

## 2021-03-09 HISTORY — PX: CARDIOVERSION: SHX1299

## 2021-03-09 HISTORY — PX: TEE WITHOUT CARDIOVERSION: SHX5443

## 2021-03-09 SURGERY — CARDIOVERSION
Anesthesia: General

## 2021-03-09 MED ORDER — PROPOFOL 500 MG/50ML IV EMUL
INTRAVENOUS | Status: DC | PRN
  Administered 2021-03-09: 150 ug/kg/min via INTRAVENOUS

## 2021-03-09 MED ORDER — AMIODARONE HCL 200 MG PO TABS: 200.0000 mg | ORAL_TABLET | Freq: Two times a day (BID) | ORAL | Status: DC

## 2021-03-09 MED ORDER — SODIUM CHLORIDE 0.9 % IV SOLN: INTRAVENOUS | Status: DC

## 2021-03-09 MED ORDER — LIDOCAINE 2% (20 MG/ML) 5 ML SYRINGE
INTRAMUSCULAR | Status: DC | PRN
  Administered 2021-03-09: 60 mg via INTRAVENOUS

## 2021-03-09 MED ORDER — LACTATED RINGERS IV SOLN: INTRAVENOUS | Status: DC

## 2021-03-09 MED ORDER — PROPOFOL 10 MG/ML IV BOLUS
INTRAVENOUS | Status: DC | PRN
  Administered 2021-03-09: 20 mg via INTRAVENOUS

## 2021-03-09 MED ORDER — NYSTATIN 100000 UNIT/GM EX POWD: 1.0000 "application " | Freq: Three times a day (TID) | CUTANEOUS | 0 refills | Status: DC | PRN

## 2021-03-09 NOTE — H&P (Signed)
ADMISSION HISTORY & PHYSICAL  Patient Name: Kayla Harrison Date of Encounter: 03/09/2021 Primary Care Physician: Adrian Prince, MD Cardiologist: Sherryl Manges, MD  Chief Complaint   Palpitations, fatigue  Patient Profile   75 yo female with recurrent atrial fibrillation and history of pacemaker  HPI   This is a 75 y.o. female with a past medical history significant for atrial fibrillation s/p PPM placed at [facility] - she had prior pulmonary vein isolation at [facility] in 2009 and had convergent ablation at [facility] in 2014 (this was complicated by heart block and subsequent need for a pacemaker). She was recently hospitalized due to E. Coli sepsis and has recurrent afib on amiodarone. She was then sent to cardiac rehab. According to Dr. Graciela Husbands, she has not had sufficient anticoagulation to warrant DCCV without TEE.  PMHx   Past Medical History:  Diagnosis Date   Anemia    Arthritis    osteoarthritis - knees and right shoulder   Blood transfusion without reported diagnosis    Breast cancer (HCC)    Dr Jamey Ripa, total thyroidectomy- 1999- for cancer   Brucellosis 1964   Chronic bilateral pleural effusions    Colon polyp    Dr Kinnie Scales   Complication of anesthesia    Ketamine produces LSD reaction, bright colored nightmarish experience    Dyslipidemia    Endometriosis    Fibroids    H/O pleural effusion    s/p thoracentesis w withdrawn   Hepatitis    [place] ?hepatitis    History of dysphagia    due to radiation therapy   History of hiatal hernia    small noted on PET scan   Hypertension    Hypothyroidism    Lung cancer, lower lobe (HCC) 01/2017   radiation RX completed 03/04/17; will start chemo 6/27, pt unaware of lung cancer   Morbid obesity (HCC)    Status post lap band surgery   Nephrolithiasis    Non Hodgkin's lymphoma (HCC)    on chemotherapy   Persistent atrial fibrillation (HCC)    a. s/p PVI 2008 b. s/p convergent  ablation 2014 complicated by bradycardia requiring pacemaker implant   Personal history of radiation therapy    Presence of permanent cardiac pacemaker    Rotator cuff tear    Right   Stroke (HCC)    2003- [place] x2   SVC syndrome    with lung mass and non hodgkins lymphoma   Thyroid cancer (HCC) 2000    Past Surgical History:  Procedure Laterality Date   ABDOMINAL HYSTERECTOMY  1983   afib ablation     a. 2008 PVI b. 2014 convergent ablation   APPENDECTOMY     BONE MARROW BIOPSY  02/21/2017   BREAST LUMPECTOMY Left 2010   bso  1998   CARDIAC CATHETERIZATION     2015- negative   CARDIOVERSION  10/09/2012   Procedure: CARDIOVERSION;  Surgeon: Rollene Rotunda, MD;  Location: [facility] OR;  Service: Cardiovascular;  Laterality: N/A;   CARDIOVERSION  10/09/2012   Procedure: CARDIOVERSION;  Surgeon: Rollene Rotunda, MD;  Location: [facility] ENDOSCOPY;  Service: Cardiovascular;  Laterality: N/A;  Kathlene November gave the ok to add pt to the add on , but we must check to find out if the can add pt on at 1400 [phone])   CARDIOVERSION N/A 11/20/2012   Procedure: CARDIOVERSION;  Surgeon: Pricilla Riffle, MD;  Location: [facility] ENDOSCOPY;  Service: Cardiovascular;  Laterality: N/A;  CARDIOVERSION N/A 07/18/2017   Procedure: CARDIOVERSION;  Surgeon: Elease Hashimoto, Deloris Ping, MD;  Location: [facility] ENDOSCOPY;  Service: Cardiovascular;  Laterality: N/A;   CARDIOVERSION N/A 10/03/2017   Procedure: CARDIOVERSION;  Surgeon: Thurmon Fair, MD;  Location: [facility] ENDOSCOPY;  Service: Cardiovascular;  Laterality: N/A;   CARDIOVERSION N/A 01/07/2018   Procedure: CARDIOVERSION;  Surgeon: Elease Hashimoto Deloris Ping, MD;  Location: [facility] ENDOSCOPY;  Service: Cardiovascular;  Laterality: N/A;   CARDIOVERSION N/A 12/10/2019   Procedure: CARDIOVERSION;  Surgeon: Jodelle Red, MD;  Location: [facility] ENDOSCOPY;  Service: Cardiovascular;  Laterality: N/A;   CHOLECYSTECTOMY     COLONOSCOPY W/ POLYPECTOMY     Dr Kinnie Scales   CYSTOSCOPY N/A 02/06/2015   Procedure:  CYSTOSCOPY;  Surgeon: Ihor Gully, MD;  Location: [facility] ORS;  Service: Urology;  Laterality: N/A;   CYSTOSCOPY W/ RETROGRADES Left 11/17/2017   Procedure: CYSTOSCOPY WITH RETROGRADE /PYELOGRAM/;  Surgeon: Ihor Gully, MD;  Location: [facility] ORS;  Service: Urology;  Laterality: Left;   CYSTOSCOPY WITH RETROGRADE PYELOGRAM, URETEROSCOPY AND STENT PLACEMENT Right 02/06/2015   Procedure: RETROGRADE PYELOGRAM, RIGHT URETEROSCOPY STENT PLACEMENT;  Surgeon: Ihor Gully, MD;  Location: [facility] ORS;  Service: Urology;  Laterality: Right;   CYSTOSCOPY WITH RETROGRADE PYELOGRAM, URETEROSCOPY AND STENT PLACEMENT Right 03/07/2017   Procedure: CYSTOSCOPY WITH RIGHT RETROGRADE PYELOGRAM,RIGHT  URETEROSCOPYLASER LITHOTRIPSY  AND STENT PLACEMENT AND STONE BASKETRY;  Surgeon: Ihor Gully, MD;  Location: [facility] [place];  Service: Urology;  Laterality: Right;   EYE SURGERY     cataract surgery   fatty mass removal  1999   pubic area   HOLMIUM LASER APPLICATION N/A 02/06/2015   Procedure: HOLMIUM LASER APPLICATION;  Surgeon: Ihor Gully, MD;  Location: [facility] ORS;  Service: Urology;  Laterality: N/A;   HOLMIUM LASER APPLICATION Right 03/07/2017   Procedure: HOLMIUM LASER APPLICATION;  Surgeon: Ihor Gully, MD;  Location: [facility];  Service: Urology;  Laterality: Right;   HOLMIUM LASER APPLICATION Left 11/17/2017   Procedure: HOLMIUM LASER APPLICATION;  Surgeon: Ihor Gully, MD;  Location: [facility] ORS;  Service: Urology;  Laterality: Left;   I & D EXTREMITY Left 12/19/2020   Procedure: IRRIGATION AND DEBRIDEMENT OF LEFT HIP HEMATOMA WITH APPLICATION OF WOUND VAC;  Surgeon: Terance Hart, MD;  Location: [facility] OR;  Service: Orthopedics;  Laterality: Left;   I & D EXTREMITY Left 02/06/2021   Procedure: IRRIGATION AND DEBRIDEMENT DEEP ABCESS LEFT THIGH, SECONDARY CLOSURE OF WOUND DEHISCENCE;  Surgeon: Terance Hart, MD;  Location: [facility] OR;  Service: Orthopedics;  Laterality: Left;   IR FLUORO GUIDE  PORT INSERTION RIGHT  02/24/2017   IR NEPHROSTOMY PLACEMENT RIGHT  11/17/2017   IR PATIENT EVAL TECH 0-60 MINS  03/11/2017   IR REMOVAL TUN ACCESS W/ PORT W/O FL MOD SED  04/20/2018   IR US GUIDE VASC ACCESS RIGHT  02/24/2017   KNEE ARTHROSCOPY     bilateral   LAPAROSCOPIC GASTRIC BANDING  07/10/2010   LAPAROSCOPIC GASTRIC BANDING     Laparoscopic adjustable banding APS System with posterior hiatal hernia, 2 suture.   LAPAROTOMY     for ruptured ovary and ovarian artery    NEPHROLITHOTOMY Right 11/17/2017   Procedure: NEPHROLITHOTOMY PERCUTANEOUS;  Surgeon: Ihor Gully, MD;  Location: [facility] ORS;  Service: Urology;  Laterality: Right;   PACEMAKER INSERTION  03/10/2013   MDT dual chamber PPM   POCKET REVISION N/A 12/08/2013   Procedure: POCKET REVISION;  Surgeon: Duke Salvia, MD;  Location: [facility] CATH LAB;  Service: Cardiovascular;  Laterality: N/A;   PORTA CATH INSERTION     REVERSE SHOULDER ARTHROPLASTY Right 05/14/2018   Procedure: RIGHT REVERSE SHOULDER ARTHROPLASTY;  Surgeon: Tania Ade, MD;  Location: [place];  Service: Orthopedics;  Laterality: Right;   REVERSE SHOULDER REPLACEMENT Right 05/14/2018   RIGHT HEART CATH N/A 07/21/2019   Procedure: RIGHT HEART CATH;  Surgeon: Larey Dresser, MD;  Location: [place] CV LAB;  Service: Cardiovascular;  Laterality: N/A;   TEE WITH CARDIOVERSION  09/22/2017   TEE WITHOUT CARDIOVERSION N/A 10/03/2017   Procedure: TRANSESOPHAGEAL ECHOCARDIOGRAM (TEE);  Surgeon: Sanda Klein, MD;  Location: [facility] ENDOSCOPY;  Service: Cardiovascular;  Laterality: N/A;   TEE WITHOUT CARDIOVERSION N/A 08/04/2019   Procedure: TRANSESOPHAGEAL ECHOCARDIOGRAM (TEE);  Surgeon: Larey Dresser, MD;  Location: [facility] ENDOSCOPY;  Service: Cardiovascular;  Laterality: N/A;   TEE WITHOUT CARDIOVERSION N/A 12/10/2019   Procedure: TRANSESOPHAGEAL ECHOCARDIOGRAM (TEE);  Surgeon: Buford Dresser, MD;  Location: [place];  Service: Cardiovascular;  Laterality: N/A;    THYROIDECTOMY  1998   Dr Margot Chimes   TONSILLECTOMY     TOTAL KNEE ARTHROPLASTY  04/13/2012   Procedure: TOTAL KNEE ARTHROPLASTY;  Surgeon: Rudean Haskell, MD;  Location: [place];  Service: Orthopedics;  Laterality: Right;   VIDEO BRONCHOSCOPY WITH ENDOBRONCHIAL ULTRASOUND N/A 02/07/2017   Procedure: VIDEO BRONCHOSCOPY WITH ENDOBRONCHIAL ULTRASOUND;  Surgeon: Marshell Garfinkel, MD;  Location: [place];  Service: Pulmonary;  Laterality: N/A;    FAMHx   Family History  Problem Relation Age of Onset   Heart disease Father 9       MI $R'@autopsy'fs$    Colon cancer Father        COLON   Heart attack Father    Other Mother        temporal arteritis    Diabetes Sister    Diabetes Brother    Diabetes Paternal Aunt    Diabetes Paternal Grandmother    Esophageal cancer Neg Hx    Inflammatory bowel disease Neg Hx    Liver disease Neg Hx    Pancreatic cancer Neg Hx    Stomach cancer Neg Hx     SOCHx    reports that she has never smoked. She has never used smokeless tobacco. She reports that she does not drink alcohol and does not use drugs.  Outpatient Medications   No current facility-administered medications on file prior to encounter.   Current Outpatient Medications on File Prior to Encounter  Medication Sig Dispense Refill   amiodarone (PACERONE) 200 MG tablet Take 2 tablets (400 mg total) by mouth daily. (Patient taking differently: Take 200 mg by mouth in the morning and at bedtime.) 90 tablet 3   apixaban (ELIQUIS) 5 MG TABS tablet Take 5 mg by mouth 2 (two) times daily.     furosemide (LASIX) 20 MG tablet Take 20 mg by mouth daily as needed for fluid or edema.      HYDROcodone-acetaminophen (NORCO/VICODIN) 5-325 MG tablet Take 1 tablet by mouth 2 (two) times daily as needed for severe pain. 14 tablet 0   levothyroxine (SYNTHROID) 137 MCG tablet Take 68.5-137 mcg by mouth See admin instructions. Taking 137 mcg tablet daily except on Saturday taking 1/2 tablet     nystatin (MYCOSTATIN/NYSTOP)  powder Apply 1 application topically 3 (three) times daily. (Patient taking differently: Apply 1 application topically 3 (three) times daily as needed (rash/irritation (skin folds)).) 60 g 1   rosuvastatin (CRESTOR) 10 MG tablet Take 10 mg by mouth 2 (two) times a week.  Mondays & Thursdays     senna (SENOKOT) 8.6 MG TABS tablet Take 2 tablets (17.2 mg total) by mouth at bedtime. For constipation--adjust as needed. Purchase OTC 120 tablet 0   acetaminophen (TYLENOL) 325 MG tablet Take 2 tablets (650 mg total) by mouth every 4 (four) hours as needed for mild pain. (Patient not taking: Reported on 03/06/2021)     ampicillin-sulbactam (UNASYN) IVPB Inject 3 g into the vein every 6 (six) hours for 21 days. Indication:  Left thigh abscess First Dose: Yes Last Day of Therapy:  03/14/21 Labs - Once weekly:  CBC/D and BMP, Labs - Every other week:  ESR and CRP Method of administration: Mini-Bag Plus / Gravity Method of administration may be changed at the discretion of home infusion pharmacist based upon assessment of the patient and/or caregiver's ability to self-administer the medication ordered. 84 Units 0   docusate sodium (COLACE) 100 MG capsule Take 1 capsule (100 mg total) by mouth 2 (two) times daily. For constipation--can adjust as needed. Purchase OTC (Patient not taking: Reported on 03/06/2021) 60 capsule 0   Emollient (OINTMENT BASE EX) Apply 1 application topically daily as needed (changing of dressing on wound). Honey [place]uka Ointment Wound     temazepam (RESTORIL) 7.5 MG capsule Take 1 capsule (7.5 mg total) by mouth at bedtime as needed for sleep. (Patient not taking: Reported on 03/06/2021) 30 capsule 0    Inpatient Medications    Scheduled Meds:   Continuous Infusions:  sodium chloride     lactated ringers      PRN Meds:    ALLERGIES   Allergies  Allergen Reactions   Ranolazine     Balance issues   Rivaroxaban     Nose Bleed X 6 hrs ; packing in ER Nasal hemorrhage   Tape  Rash and Other (See Comments)    SKIN IS THIN AND TEARS EASILY (also does not tolerate paper tape well)     Tikosyn [Dofetilide] Other (See Comments)    "Long QT wave"    Epinephrine Other (See Comments)    Oral anesthetic Dental form only (liquid). Patient stated she will become "out of it" she can hear you but cannot respond in normal fashion. "not fully with it"   Ketamine Other (See Comments)    Hallucinations     ROS   Pertinent items noted in HPI and remainder of comprehensive ROS otherwise negative.  Vitals   Vitals:   03/09/21 0705  BP: (!) 159/102  Pulse: (!) 107  Resp: (!) 22  Temp: 98.7 F (37.1 C)  TempSrc: Oral  SpO2: 100%  Weight: 95.3 kg  Height: 5\' 6"  (1.676 m)   No intake or output data in the 24 hours ending 03/09/21 0751 Filed Weights   03/09/21 0705  Weight: 95.3 kg    Physical Exam   General appearance: alert and no distress Lungs: clear to auscultation bilaterally Heart: irregularly irregular rhythm Extremities: extremities normal, atraumatic, no cyanosis or edema Skin: Skin color, texture, turgor normal. No rashes or lesions Neurologic: Grossly normal Psych: Pleasant  Labs   No results found. However, due to the size of the patient record, not all encounters were searched. Please check Results Review for a complete set of results.  ECG   Atrial flutter at 115 - personally reviewed  Telemetry   Atrial flutter with RVR - Personally Reviewed  Radiology   No results found.  Cardiac Studies   None  Assessment   Active Problems:  Atrial flutter (HCC)   Plan   Persistent atrial flutter s/p PPM and multiple afib ablations - recently hospitalized with sepsis. Interrupted anticoagulation - will plan to pursue TEE/DCCV to r/o thrombus and evaluate valves and pacer leads as well.  She is agreeable to this plan.  Time Spent Directly with Patient:  I have spent a total of 25 minutes with patient reviewing hospital notes,  telemetry, EKGs, labs and examining the patient as well as establishing an assessment and plan that was discussed with the patient.  > 50% of time was spent in direct patient care.   Length of Stay:  LOS: 0 days   Pixie Casino, MD, [facility], [place] Director of the Advanced Lipid Disorders &  Cardiovascular Risk Reduction Clinic Diplomate of the American Board of Clinical Lipidology Attending Cardiologist  Direct Dial: [phone]  Fax: [phone]  Website:  [email or web address] 03/09/2021, 7:51 AM

## 2021-03-09 NOTE — Discharge Instructions (Signed)
Electrical Cardioversion  Electrical cardioversion is the delivery of a jolt of electricity to restore a normal rhythm to the heart. A rhythm that is too fast or is not regular keeps the heart from pumping well. In this procedure, sticky patches or metal paddles are placed on the chest to deliver electricity to the heart from a device.  If this information does not answer your questions, please call [place] office at [phone] to clarify.   Follow these instructions at home: You may have some redness on the skin where the shocks were given.  You may apply over-the-counter hydrocortisone cream or aloe vera to alleviate skin irritation. YOU SHOULD NOT DRIVE, use power tools, machinery or perform tasks that involve climbing or major physical exertion for 24 hours (because of the sedation medicines used during the test).  Take over-the-counter and prescription medicines only as told by your health care provider. Ask your health care provider how to check your pulse. Check it often. Rest for 48 hours after the procedure or as told by your health care provider. Avoid or limit your caffeine use as told by your health care provider. Keep all follow-up visits as told by your health care provider. This is important.  FOLLOW UP:  Please also call with any specific questions about appointments or follow up tests.  TEE  YOU HAD AN CARDIAC PROCEDURE TODAY: Refer to the procedure report and other information in the discharge instructions given to you for any specific questions about what was found during the examination. If this information does not answer your questions, please call [facility] office at [phone] to clarify.   DIET: Your first meal following the procedure should be a light meal and then it is ok to progress to your normal diet. A half-sandwich or bowl of soup is an example of a good first meal. Heavy or fried foods are harder to digest and may make you  feel nauseous or bloated. Drink plenty of fluids but you should avoid alcoholic beverages for 24 hours. If you had a esophageal dilation, please see attached instructions for diet.   ACTIVITY: Your care partner should take you home directly after the procedure. You should plan to take it easy, moving slowly for the rest of the day. You can resume normal activity the day after the procedure however YOU SHOULD NOT DRIVE, use power tools, machinery or perform tasks that involve climbing or major physical exertion for 24 hours (because of the sedation medicines used during the test).   SYMPTOMS TO REPORT IMMEDIATELY: A cardiologist can be reached at any hour. Please call [phone] for any of the following symptoms:  Vomiting of blood or coffee ground material  New, significant abdominal pain  New, significant chest pain or pain under the shoulder blades  Painful or persistently difficult swallowing  New shortness of breath  Black, tarry-looking or red, bloody stools  FOLLOW UP:  Please also call with any specific questions about appointments or follow up tests.

## 2021-03-09 NOTE — CV Procedure (Signed)
TEE/CARDIOVERSION NOTE  TRANSESOPHAGEAL ECHOCARDIOGRAM (TEE):  Indictation: Atrial Flutter  Consent:   Informed consent was obtained prior to the procedure. The risks, benefits and alternatives for the procedure were discussed and the patient comprehended these risks.  Risks include, but are not limited to, cough, sore throat, vomiting, nausea, somnolence, esophageal and stomach trauma or perforation, bleeding, low blood pressure, aspiration, pneumonia, infection, trauma to the teeth and death.    Time Out: Verified patient identification, verified procedure, site/side was marked, verified correct patient position, special equipment/implants available, medications/allergies/relevent history reviewed, required imaging and test results available. Performed  Procedure:  After a procedural time-out, the patient was given propofol per anesthesia for sedation. The patient's heart rate, blood pressure, and oxygen saturation are monitored continuously during the procedure.  The transesophageal probe was inserted in the esophagus and stomach without difficulty and multiple views were obtained. Agitated microbubble saline contrast was not administered.  Complications:    Complications: None Patient did tolerate procedure well.  Findings:  LEFT VENTRICLE: The left ventricular wall thickness is mildly increased.  The left ventricular cavity is normal in size. Wall motion is normal.  LVEF is 55-60%.  RIGHT VENTRICLE:  The right ventricle is normal in structure and function without any thrombus or masses.    LEFT ATRIUM:  The left atrium is moderately dilated in size without any thrombus or masses.  There is spontaneous echo contrast ("smoke") in the left atrium consistent with a low flow state.  LEFT ATRIAL APPENDAGE:  The left atrial appendage is free of any thrombus or masses. This is a large appendage, pyramidal shape with a wide neck - not likely suitable for closure. The appendage has  single lobes. Pulse doppler indicates moderate flow in the appendage.  ATRIAL SEPTUM:  The atrial septum appears intact and is free of thrombus and/or masses.  There is no evidence for interatrial shunting by color doppler and saline microbubble.  RIGHT ATRIUM:  The right atrium is normal in size and function without any thrombus or masses. There are pacemaker wires seen - there is a 0.5 x 2 cm mobile filamentous structure attached to the pacer lead, which is likely fibrinous material - this was seen last year on TEE, therefore, would not likely represent new vegetation.  MITRAL VALVE:  The mitral valve is heavily calcified around the annulus and the chordae are calcified as well. There is tethering of the basal to mid portions of  both mitral leaflets. There is Moderate regurgitation.with 2 distinct jets and mild stenosis (mean gradient 5 mmHg).  There were no vegetations.  AORTIC VALVE:  The aortic valve is trileaflet and sclerotic in appearance with  no  regurgitation.  There were no vegetations or stenosis  TRICUSPID VALVE:  The tricuspid valve is normal in structure and function with Moderate regurgitation secondary to interference with the pacemaker leads.  There were no vegetations or stenosis   PULMONIC VALVE:  The pulmonic valve is normal in structure and function with Mild regurgitation.  There were no vegetations or stenosis.   AORTIC ARCH, ASCENDING AND DESCENDING AORTA:  There was grade 2 Ron Parker et. Al, 1992) atherosclerosis of the ascending aorta, aortic arch, or proximal descending aorta.  12. PULMONARY VEINS: Anomalous pulmonary venous return was not noted.  13. PERICARDIUM: The pericardium appeared normal and non-thickened.  There is no pericardial effusion.  CARDIOVERSION:     Second Time Out: Verified patient identification, verified procedure, site/side was marked, verified correct patient position, special equipment/implants available,  medications/allergies/relevent history  reviewed, required imaging and test results available.  Performed  Procedure:  Patient placed on cardiac monitor, pulse oximetry, supplemental oxygen as necessary.  Sedation administered per anesthesia Pacer pads placed anterior and posterior chest. Cardioverted 1 time(s).  Cardioverted at 120J biphasic.  Complications:  Complications: None Patient did tolerate procedure well.  Impression:  No LAA thrombus Negative for PFO by color doppler Pacer leads seen in the right heart with adherent mobile fibrinous strand (previously identified). Heavy MAC and subvalvular calcification with moderate MR and mild MS Aortic valve sclerosis Moderate TR Mild PI Moderate LAE with smoke LVEF 55-60% Successful DCCV with a single 120J biphasic shock  Recommendations:  Successful DCCV to sinus rhythm - pacer rep was present and confirmed. Continue amiodarone 200 mg BID and anticoagulation with Eliquis. Follow-up with Dr. Caryl Comes.  Time Spent Directly with the Patient:  45 minutes   Pixie Casino, MD, [employer] [facility], [place] Director of the Advanced Lipid Disorders &  Cardiovascular Risk Reduction Clinic Diplomate of the American Board of Clinical Lipidology Attending Cardiologist  Direct Dial: [phone]  Fax: [phone]  Website:  [email or web address] 03/09/2021, 8:43 AM

## 2021-03-09 NOTE — Anesthesia Postprocedure Evaluation (Signed)
Anesthesia Post Note  Patient: Kayla Harrison  Procedure(s) Performed: CARDIOVERSION TRANSESOPHAGEAL ECHOCARDIOGRAM (TEE)     Patient location during evaluation: PACU Anesthesia Type: General Level of consciousness: awake and alert and oriented Pain management: pain level controlled Vital Signs Assessment: post-procedure vital signs reviewed and stable Respiratory status: spontaneous breathing, nonlabored ventilation and respiratory function stable Cardiovascular status: blood pressure returned to baseline and stable Postop Assessment: no apparent nausea or vomiting Anesthetic complications: no   No notable events documented.  Last Vitals:  Vitals:   03/09/21 0835 03/09/21 0845  BP: (!) 117/56 129/61  Pulse: 70 70  Resp: (!) 25 (!) 25  Temp: 36.8 C   SpO2: 96% 97%    Last Pain:  Vitals:   03/09/21 0845  TempSrc:   PainSc: 0-No pain                 Corsica Franson A.

## 2021-03-09 NOTE — Progress Notes (Signed)
  Echocardiogram Echocardiogram Transesophageal has been performed.  Bobbye Charleston 03/09/2021, 8:36 AM

## 2021-03-09 NOTE — Transfer of Care (Signed)
Immediate Anesthesia Transfer of Care Note  Patient: Kayla Harrison  Procedure(s) Performed: CARDIOVERSION TRANSESOPHAGEAL ECHOCARDIOGRAM (TEE)  Patient Location: PACU and Endoscopy Unit  Anesthesia Type:General  Level of Consciousness: drowsy  Airway & Oxygen Therapy: Patient Spontanous Breathing and Patient connected to nasal cannula oxygen  Post-op Assessment: Report given to RN and Post -op Vital signs reviewed and stable  Post vital signs: Reviewed and stable  Last Vitals:  Vitals Value Taken Time  BP 117/56 03/09/21 0835  Temp    Pulse 69 03/09/21 0836  Resp 28 03/09/21 0836  SpO2 95 % 03/09/21 0836  Vitals shown include unvalidated device data.  Last Pain:  Vitals:   03/09/21 0705  TempSrc: Oral  PainSc: 0-No pain         Complications: No notable events documented.

## 2021-03-09 NOTE — Anesthesia Procedure Notes (Signed)
Procedure Name: MAC Date/Time: 03/09/2021 8:10 AM Performed by: Imagene Riches, CRNA Pre-anesthesia Checklist: Patient identified, Emergency Drugs available, Suction available, Patient being monitored and Timeout performed Patient Re-evaluated:Patient Re-evaluated prior to induction Oxygen Delivery Method: Nasal cannula

## 2021-03-11 ENCOUNTER — Encounter (HOSPITAL_COMMUNITY): Payer: Self-pay | Admitting: Internal Medicine

## 2021-03-12 DIAGNOSIS — S2241XD Multiple fractures of ribs, right side, subsequent encounter for fracture with routine healing: Secondary | ICD-10-CM | POA: Diagnosis not present

## 2021-03-12 DIAGNOSIS — I110 Hypertensive heart disease with heart failure: Secondary | ICD-10-CM | POA: Diagnosis not present

## 2021-03-12 DIAGNOSIS — Z950 Presence of cardiac pacemaker: Secondary | ICD-10-CM | POA: Diagnosis not present

## 2021-03-12 DIAGNOSIS — Z602 Problems related to living alone: Secondary | ICD-10-CM | POA: Diagnosis not present

## 2021-03-12 DIAGNOSIS — I69320 Aphasia following cerebral infarction: Secondary | ICD-10-CM | POA: Diagnosis not present

## 2021-03-12 DIAGNOSIS — M170 Bilateral primary osteoarthritis of knee: Secondary | ICD-10-CM | POA: Diagnosis not present

## 2021-03-12 DIAGNOSIS — Z9181 History of falling: Secondary | ICD-10-CM | POA: Diagnosis not present

## 2021-03-12 DIAGNOSIS — I951 Orthostatic hypotension: Secondary | ICD-10-CM | POA: Diagnosis not present

## 2021-03-12 DIAGNOSIS — Z85850 Personal history of malignant neoplasm of thyroid: Secondary | ICD-10-CM | POA: Diagnosis not present

## 2021-03-12 DIAGNOSIS — E890 Postprocedural hypothyroidism: Secondary | ICD-10-CM | POA: Diagnosis not present

## 2021-03-12 DIAGNOSIS — B9620 Unspecified Escherichia coli [E. coli] as the cause of diseases classified elsewhere: Secondary | ICD-10-CM | POA: Diagnosis not present

## 2021-03-12 DIAGNOSIS — I4819 Other persistent atrial fibrillation: Secondary | ICD-10-CM | POA: Diagnosis not present

## 2021-03-12 DIAGNOSIS — Z8616 Personal history of COVID-19: Secondary | ICD-10-CM | POA: Diagnosis not present

## 2021-03-12 DIAGNOSIS — I4892 Unspecified atrial flutter: Secondary | ICD-10-CM | POA: Diagnosis not present

## 2021-03-12 DIAGNOSIS — B952 Enterococcus as the cause of diseases classified elsewhere: Secondary | ICD-10-CM | POA: Diagnosis not present

## 2021-03-12 DIAGNOSIS — Z853 Personal history of malignant neoplasm of breast: Secondary | ICD-10-CM | POA: Diagnosis not present

## 2021-03-12 DIAGNOSIS — I498 Other specified cardiac arrhythmias: Secondary | ICD-10-CM | POA: Diagnosis not present

## 2021-03-12 DIAGNOSIS — T8131XD Disruption of external operation (surgical) wound, not elsewhere classified, subsequent encounter: Secondary | ICD-10-CM | POA: Diagnosis not present

## 2021-03-12 DIAGNOSIS — Z792 Long term (current) use of antibiotics: Secondary | ICD-10-CM | POA: Diagnosis not present

## 2021-03-12 DIAGNOSIS — C8592 Non-Hodgkin lymphoma, unspecified, intrathoracic lymph nodes: Secondary | ICD-10-CM | POA: Diagnosis not present

## 2021-03-12 DIAGNOSIS — I700 Atherosclerosis of aorta: Secondary | ICD-10-CM | POA: Diagnosis not present

## 2021-03-12 DIAGNOSIS — S52045D Nondisplaced fracture of coronoid process of left ulna, subsequent encounter for closed fracture with routine healing: Secondary | ICD-10-CM | POA: Diagnosis not present

## 2021-03-12 DIAGNOSIS — S71102S Unspecified open wound, left thigh, sequela: Secondary | ICD-10-CM | POA: Diagnosis not present

## 2021-03-12 DIAGNOSIS — D509 Iron deficiency anemia, unspecified: Secondary | ICD-10-CM | POA: Diagnosis not present

## 2021-03-12 DIAGNOSIS — Z6833 Body mass index (BMI) 33.0-33.9, adult: Secondary | ICD-10-CM | POA: Diagnosis not present

## 2021-03-12 DIAGNOSIS — E440 Moderate protein-calorie malnutrition: Secondary | ICD-10-CM | POA: Diagnosis not present

## 2021-03-12 DIAGNOSIS — Z85118 Personal history of other malignant neoplasm of bronchus and lung: Secondary | ICD-10-CM | POA: Diagnosis not present

## 2021-03-12 DIAGNOSIS — N1831 Chronic kidney disease, stage 3a: Secondary | ICD-10-CM | POA: Diagnosis not present

## 2021-03-12 DIAGNOSIS — Z7901 Long term (current) use of anticoagulants: Secondary | ICD-10-CM | POA: Diagnosis not present

## 2021-03-12 DIAGNOSIS — I890 Lymphedema, not elsewhere classified: Secondary | ICD-10-CM | POA: Diagnosis not present

## 2021-03-12 DIAGNOSIS — I480 Paroxysmal atrial fibrillation: Secondary | ICD-10-CM | POA: Diagnosis not present

## 2021-03-12 DIAGNOSIS — I5032 Chronic diastolic (congestive) heart failure: Secondary | ICD-10-CM | POA: Diagnosis not present

## 2021-03-13 DIAGNOSIS — T8131XD Disruption of external operation (surgical) wound, not elsewhere classified, subsequent encounter: Secondary | ICD-10-CM | POA: Diagnosis not present

## 2021-03-13 DIAGNOSIS — B9620 Unspecified Escherichia coli [E. coli] as the cause of diseases classified elsewhere: Secondary | ICD-10-CM | POA: Diagnosis not present

## 2021-03-13 DIAGNOSIS — B952 Enterococcus as the cause of diseases classified elsewhere: Secondary | ICD-10-CM | POA: Diagnosis not present

## 2021-03-13 DIAGNOSIS — I110 Hypertensive heart disease with heart failure: Secondary | ICD-10-CM | POA: Diagnosis not present

## 2021-03-13 DIAGNOSIS — S2241XD Multiple fractures of ribs, right side, subsequent encounter for fracture with routine healing: Secondary | ICD-10-CM | POA: Diagnosis not present

## 2021-03-13 DIAGNOSIS — S52045D Nondisplaced fracture of coronoid process of left ulna, subsequent encounter for closed fracture with routine healing: Secondary | ICD-10-CM | POA: Diagnosis not present

## 2021-03-14 ENCOUNTER — Telehealth: Payer: Self-pay | Admitting: *Deleted

## 2021-03-14 ENCOUNTER — Encounter (HOSPITAL_BASED_OUTPATIENT_CLINIC_OR_DEPARTMENT_OTHER): Payer: Medicare Other | Admitting: Internal Medicine

## 2021-03-14 ENCOUNTER — Encounter (HOSPITAL_COMMUNITY): Payer: Self-pay | Admitting: Emergency Medicine

## 2021-03-14 ENCOUNTER — Other Ambulatory Visit: Payer: Self-pay

## 2021-03-14 ENCOUNTER — Emergency Department (HOSPITAL_COMMUNITY): Payer: Medicare Other

## 2021-03-14 ENCOUNTER — Inpatient Hospital Stay (HOSPITAL_COMMUNITY)
Admission: EM | Admit: 2021-03-14 | Discharge: 2021-03-19 | DRG: 065 | Disposition: A | Discharge status: Rehabilitation Facility | Payer: Medicare Other | Attending: Internal Medicine | Admitting: Internal Medicine

## 2021-03-14 DIAGNOSIS — I610 Nontraumatic intracerebral hemorrhage in hemisphere, subcortical: Secondary | ICD-10-CM | POA: Diagnosis not present

## 2021-03-14 DIAGNOSIS — M1712 Unilateral primary osteoarthritis, left knee: Secondary | ICD-10-CM | POA: Diagnosis present

## 2021-03-14 DIAGNOSIS — E785 Hyperlipidemia, unspecified: Secondary | ICD-10-CM | POA: Diagnosis present

## 2021-03-14 DIAGNOSIS — I10 Essential (primary) hypertension: Secondary | ICD-10-CM | POA: Diagnosis not present

## 2021-03-14 DIAGNOSIS — Z833 Family history of diabetes mellitus: Secondary | ICD-10-CM

## 2021-03-14 DIAGNOSIS — Z79899 Other long term (current) drug therapy: Secondary | ICD-10-CM

## 2021-03-14 DIAGNOSIS — G936 Cerebral edema: Secondary | ICD-10-CM | POA: Diagnosis not present

## 2021-03-14 DIAGNOSIS — I959 Hypotension, unspecified: Secondary | ICD-10-CM | POA: Diagnosis present

## 2021-03-14 DIAGNOSIS — Z8572 Personal history of non-Hodgkin lymphomas: Secondary | ICD-10-CM | POA: Diagnosis not present

## 2021-03-14 DIAGNOSIS — E890 Postprocedural hypothyroidism: Secondary | ICD-10-CM | POA: Diagnosis present

## 2021-03-14 DIAGNOSIS — S2241XD Multiple fractures of ribs, right side, subsequent encounter for fracture with routine healing: Secondary | ICD-10-CM | POA: Diagnosis not present

## 2021-03-14 DIAGNOSIS — D689 Coagulation defect, unspecified: Secondary | ICD-10-CM

## 2021-03-14 DIAGNOSIS — T45515A Adverse effect of anticoagulants, initial encounter: Secondary | ICD-10-CM | POA: Diagnosis present

## 2021-03-14 DIAGNOSIS — I700 Atherosclerosis of aorta: Secondary | ICD-10-CM | POA: Diagnosis not present

## 2021-03-14 DIAGNOSIS — I482 Chronic atrial fibrillation, unspecified: Secondary | ICD-10-CM | POA: Diagnosis not present

## 2021-03-14 DIAGNOSIS — M19011 Primary osteoarthritis, right shoulder: Secondary | ICD-10-CM | POA: Diagnosis present

## 2021-03-14 DIAGNOSIS — R001 Bradycardia, unspecified: Secondary | ICD-10-CM | POA: Diagnosis present

## 2021-03-14 DIAGNOSIS — R600 Localized edema: Secondary | ICD-10-CM | POA: Diagnosis present

## 2021-03-14 DIAGNOSIS — I4819 Other persistent atrial fibrillation: Secondary | ICD-10-CM | POA: Diagnosis present

## 2021-03-14 DIAGNOSIS — B9620 Unspecified Escherichia coli [E. coli] as the cause of diseases classified elsewhere: Secondary | ICD-10-CM | POA: Diagnosis not present

## 2021-03-14 DIAGNOSIS — Z8249 Family history of ischemic heart disease and other diseases of the circulatory system: Secondary | ICD-10-CM | POA: Diagnosis not present

## 2021-03-14 DIAGNOSIS — T8131XD Disruption of external operation (surgical) wound, not elsewhere classified, subsequent encounter: Secondary | ICD-10-CM | POA: Diagnosis not present

## 2021-03-14 DIAGNOSIS — D649 Anemia, unspecified: Secondary | ICD-10-CM | POA: Diagnosis present

## 2021-03-14 DIAGNOSIS — Z853 Personal history of malignant neoplasm of breast: Secondary | ICD-10-CM

## 2021-03-14 DIAGNOSIS — I6389 Other cerebral infarction: Secondary | ICD-10-CM | POA: Diagnosis not present

## 2021-03-14 DIAGNOSIS — Z7901 Long term (current) use of anticoagulants: Secondary | ICD-10-CM | POA: Diagnosis not present

## 2021-03-14 DIAGNOSIS — Z888 Allergy status to other drugs, medicaments and biological substances status: Secondary | ICD-10-CM

## 2021-03-14 DIAGNOSIS — L89892 Pressure ulcer of other site, stage 2: Secondary | ICD-10-CM | POA: Diagnosis present

## 2021-03-14 DIAGNOSIS — Z85850 Personal history of malignant neoplasm of thyroid: Secondary | ICD-10-CM | POA: Diagnosis not present

## 2021-03-14 DIAGNOSIS — Z923 Personal history of irradiation: Secondary | ICD-10-CM

## 2021-03-14 DIAGNOSIS — R2689 Other abnormalities of gait and mobility: Secondary | ICD-10-CM | POA: Diagnosis present

## 2021-03-14 DIAGNOSIS — Z8673 Personal history of transient ischemic attack (TIA), and cerebral infarction without residual deficits: Secondary | ICD-10-CM | POA: Diagnosis not present

## 2021-03-14 DIAGNOSIS — Z800 Family history of malignant neoplasm of digestive organs: Secondary | ICD-10-CM

## 2021-03-14 DIAGNOSIS — R4182 Altered mental status, unspecified: Secondary | ICD-10-CM | POA: Diagnosis not present

## 2021-03-14 DIAGNOSIS — I951 Orthostatic hypotension: Secondary | ICD-10-CM | POA: Diagnosis not present

## 2021-03-14 DIAGNOSIS — Z9221 Personal history of antineoplastic chemotherapy: Secondary | ICD-10-CM | POA: Diagnosis not present

## 2021-03-14 DIAGNOSIS — I615 Nontraumatic intracerebral hemorrhage, intraventricular: Secondary | ICD-10-CM | POA: Diagnosis not present

## 2021-03-14 DIAGNOSIS — E782 Mixed hyperlipidemia: Secondary | ICD-10-CM | POA: Diagnosis present

## 2021-03-14 DIAGNOSIS — E669 Obesity, unspecified: Secondary | ICD-10-CM | POA: Diagnosis present

## 2021-03-14 DIAGNOSIS — Z85118 Personal history of other malignant neoplasm of bronchus and lung: Secondary | ICD-10-CM

## 2021-03-14 DIAGNOSIS — I69198 Other sequelae of nontraumatic intracerebral hemorrhage: Secondary | ICD-10-CM | POA: Diagnosis not present

## 2021-03-14 DIAGNOSIS — I6523 Occlusion and stenosis of bilateral carotid arteries: Secondary | ICD-10-CM | POA: Diagnosis not present

## 2021-03-14 DIAGNOSIS — R29708 NIHSS score 8: Secondary | ICD-10-CM | POA: Diagnosis present

## 2021-03-14 DIAGNOSIS — S06360A Traumatic hemorrhage of cerebrum, unspecified, without loss of consciousness, initial encounter: Secondary | ICD-10-CM | POA: Diagnosis not present

## 2021-03-14 DIAGNOSIS — E039 Hypothyroidism, unspecified: Secondary | ICD-10-CM | POA: Diagnosis present

## 2021-03-14 DIAGNOSIS — I4821 Permanent atrial fibrillation: Secondary | ICD-10-CM | POA: Diagnosis present

## 2021-03-14 DIAGNOSIS — Z20822 Contact with and (suspected) exposure to covid-19: Secondary | ICD-10-CM | POA: Diagnosis present

## 2021-03-14 DIAGNOSIS — S71102S Unspecified open wound, left thigh, sequela: Secondary | ICD-10-CM | POA: Diagnosis not present

## 2021-03-14 DIAGNOSIS — R296 Repeated falls: Secondary | ICD-10-CM | POA: Diagnosis present

## 2021-03-14 DIAGNOSIS — Z79890 Hormone replacement therapy (postmenopausal): Secondary | ICD-10-CM

## 2021-03-14 DIAGNOSIS — Z96651 Presence of right artificial knee joint: Secondary | ICD-10-CM | POA: Diagnosis present

## 2021-03-14 DIAGNOSIS — I110 Hypertensive heart disease with heart failure: Secondary | ICD-10-CM | POA: Diagnosis not present

## 2021-03-14 DIAGNOSIS — R531 Weakness: Secondary | ICD-10-CM | POA: Diagnosis not present

## 2021-03-14 DIAGNOSIS — N390 Urinary tract infection, site not specified: Secondary | ICD-10-CM | POA: Diagnosis present

## 2021-03-14 DIAGNOSIS — J90 Pleural effusion, not elsewhere classified: Secondary | ICD-10-CM | POA: Diagnosis not present

## 2021-03-14 DIAGNOSIS — Z950 Presence of cardiac pacemaker: Secondary | ICD-10-CM

## 2021-03-14 DIAGNOSIS — Z6832 Body mass index (BMI) 32.0-32.9, adult: Secondary | ICD-10-CM

## 2021-03-14 DIAGNOSIS — I629 Nontraumatic intracranial hemorrhage, unspecified: Secondary | ICD-10-CM | POA: Diagnosis not present

## 2021-03-14 DIAGNOSIS — I69298 Other sequelae of other nontraumatic intracranial hemorrhage: Secondary | ICD-10-CM | POA: Diagnosis not present

## 2021-03-14 DIAGNOSIS — L8990 Pressure ulcer of unspecified site, unspecified stage: Secondary | ICD-10-CM | POA: Insufficient documentation

## 2021-03-14 DIAGNOSIS — B952 Enterococcus as the cause of diseases classified elsewhere: Secondary | ICD-10-CM | POA: Diagnosis not present

## 2021-03-14 DIAGNOSIS — Z96611 Presence of right artificial shoulder joint: Secondary | ICD-10-CM | POA: Diagnosis present

## 2021-03-14 DIAGNOSIS — I619 Nontraumatic intracerebral hemorrhage, unspecified: Secondary | ICD-10-CM | POA: Diagnosis present

## 2021-03-14 DIAGNOSIS — S52045D Nondisplaced fracture of coronoid process of left ulna, subsequent encounter for closed fracture with routine healing: Secondary | ICD-10-CM | POA: Diagnosis not present

## 2021-03-14 DIAGNOSIS — I6622 Occlusion and stenosis of left posterior cerebral artery: Secondary | ICD-10-CM | POA: Diagnosis not present

## 2021-03-14 LAB — URINALYSIS, ROUTINE W REFLEX MICROSCOPIC
Bilirubin Urine: NEGATIVE
Glucose, UA: NEGATIVE mg/dL
Hgb urine dipstick: NEGATIVE
Ketones, ur: NEGATIVE mg/dL
Leukocytes,Ua: NEGATIVE
Nitrite: NEGATIVE
Protein, ur: NEGATIVE mg/dL
Specific Gravity, Urine: 1.006 (ref 1.005–1.030)
pH: 8 (ref 5.0–8.0)

## 2021-03-14 LAB — COMPREHENSIVE METABOLIC PANEL
ALT: 23 U/L (ref 0–44)
AST: 27 U/L (ref 15–41)
Albumin: 3.7 g/dL (ref 3.5–5.0)
Alkaline Phosphatase: 97 U/L (ref 38–126)
Anion gap: 9 (ref 5–15)
BUN: 18 mg/dL (ref 8–23)
CO2: 29 mmol/L (ref 22–32)
Calcium: 9 mg/dL (ref 8.9–10.3)
Chloride: 100 mmol/L (ref 98–111)
Creatinine, Ser: 0.98 mg/dL (ref 0.44–1.00)
GFR, Estimated: 60 mL/min — ABNORMAL LOW (ref >60)
Glucose, Bld: 111 mg/dL — ABNORMAL HIGH (ref 70–99)
Potassium: 3.6 mmol/L (ref 3.5–5.1)
Sodium: 138 mmol/L (ref 135–145)
Total Bilirubin: 1 mg/dL (ref 0.3–1.2)
Total Protein: 7 g/dL (ref 6.5–8.1)

## 2021-03-14 LAB — RAPID URINE DRUG SCREEN, HOSP PERFORMED
Amphetamines: NOT DETECTED
Barbiturates: NOT DETECTED
Benzodiazepines: NOT DETECTED
Cocaine: NOT DETECTED
Opiates: NOT DETECTED
Tetrahydrocannabinol: NOT DETECTED

## 2021-03-14 LAB — CBC WITH DIFFERENTIAL/PLATELET
Abs Immature Granulocytes: 0.06 10*3/uL (ref 0.00–0.07)
Basophils Absolute: 0.1 10*3/uL (ref 0.0–0.1)
Basophils Relative: 1 %
Eosinophils Absolute: 0 10*3/uL (ref 0.0–0.5)
Eosinophils Relative: 0 %
HCT: 34.5 % — ABNORMAL LOW (ref 36.0–46.0)
Hemoglobin: 10.6 g/dL — ABNORMAL LOW (ref 12.0–15.0)
Immature Granulocytes: 1 %
Lymphocytes Relative: 7 %
Lymphs Abs: 0.8 10*3/uL (ref 0.7–4.0)
MCH: 27.5 pg (ref 26.0–34.0)
MCHC: 30.7 g/dL (ref 30.0–36.0)
MCV: 89.6 fL (ref 80.0–100.0)
Monocytes Absolute: 1.3 10*3/uL — ABNORMAL HIGH (ref 0.1–1.0)
Monocytes Relative: 11 %
Neutro Abs: 9.9 10*3/uL — ABNORMAL HIGH (ref 1.7–7.7)
Neutrophils Relative %: 80 %
Platelets: 254 10*3/uL (ref 150–400)
RBC: 3.85 MIL/uL — ABNORMAL LOW (ref 3.87–5.11)
RDW: 16.7 % — ABNORMAL HIGH (ref 11.5–15.5)
WBC: 12.2 10*3/uL — ABNORMAL HIGH (ref 4.0–10.5)
nRBC: 0 % (ref 0.0–0.2)

## 2021-03-14 LAB — ETHANOL: Alcohol, Ethyl (B): <10 mg/dL (ref <10)

## 2021-03-14 LAB — TROPONIN I (HIGH SENSITIVITY)
Troponin I (High Sensitivity): 8 ng/L (ref <18)
Troponin I (High Sensitivity): 10 ng/L (ref <18)

## 2021-03-14 LAB — CK: Total CK: 50 U/L (ref 38–234)

## 2021-03-14 MED ORDER — PROTHROMBIN COMPLEX CONC HUMAN 500 UNITS IV KIT
4416.0000 [IU] | PACK | Status: AC
  Administered 2021-03-14: 4416 [IU] via INTRAVENOUS
  Filled 2021-03-14: qty 4416

## 2021-03-14 MED ORDER — CHLORHEXIDINE GLUCONATE CLOTH 2 % EX PADS
6.0000 | MEDICATED_PAD | Freq: Every day | CUTANEOUS | Status: DC
  Administered 2021-03-15 – 2021-03-17 (×3): 6 via TOPICAL

## 2021-03-14 MED ORDER — CLEVIDIPINE BUTYRATE 0.5 MG/ML IV EMUL
0.0000 mg/h | INTRAVENOUS | Status: DC
  Administered 2021-03-14: 1 mg/h via INTRAVENOUS
  Filled 2021-03-14: qty 50

## 2021-03-14 MED ORDER — SODIUM CHLORIDE (PF) 0.9 % IJ SOLN
INTRAMUSCULAR | Status: AC
  Filled 2021-03-14: qty 50

## 2021-03-14 MED ORDER — IOHEXOL 350 MG/ML SOLN
80.0000 mL | Freq: Once | INTRAVENOUS | Status: AC | PRN
  Administered 2021-03-14: 80 mL via INTRAVENOUS

## 2021-03-14 NOTE — ED Notes (Signed)
Update given to pt sister Danahi Reddish [phone].

## 2021-03-14 NOTE — ED Notes (Signed)
Provider aware of poor EKG quality, requests one anyway.

## 2021-03-14 NOTE — ED Notes (Signed)
Pt transported to CT ?

## 2021-03-14 NOTE — Telephone Encounter (Signed)
CN telephoned patient to to inquire how she has been feeling since the cardioversion procedure.  Patient reports she has been doing quite well and she was on her way to a follow up appointment today.

## 2021-03-14 NOTE — ED Triage Notes (Signed)
Arrives via EMS from home, she lives on her own, has a home health aide that visits 3x week. Pt slid out of bed onto floor for approx 3 hours, patient is alert and oriented x4, recent hospital for wound on L hip, has wound vac, but pt removed it. Hx of sepsis.   CBG 110 HR 76 BP 172/95

## 2021-03-14 NOTE — Consult Note (Signed)
Reason for Consult:ICH with ventricular extension Referring Physician: EDP  NAW LASALA is an 75 y.o. female.   HPI:  75 year old female presented to the ED tonight after being found down.  She was recently admitted for a leg wound infection where she had a wound VAC placed and was in inpatient rehab for couple weeks.  She was also admitted recently for A. fib.  She has been feeling weak and dizzy.  She does not actually remember why she is in the hospital currently.  Denies any headaches nausea or vomiting.  She is a little confused upon examination but alert oriented x4.  She is on Eliquis for A. fib  Past Medical History:  Diagnosis Date   Anemia    Arthritis    osteoarthritis - knees and right shoulder   Blood transfusion without reported diagnosis    Breast cancer ([place])    Dr Margot Chimes, total thyroidectomy- 1999- for cancer   Brucellosis 1964   Chronic bilateral pleural effusions    Colon polyp    Dr Earlean Shawl   Complication of anesthesia    Ketamine produces LSD reaction, bright colored nightmarish experience    Dyslipidemia    Endometriosis    Fibroids    H/O pleural effusion    s/p thoracentesis w 3240m withdrawn   Hepatitis    [place] ?hepatitis    History of dysphagia    due to radiation therapy   History of hiatal hernia    small noted on PET scan   Hypertension    Hypothyroidism    [place]   radiation RX completed 03/04/17; will start chemo 6/27, pt unaware of lung cancer   Morbid obesity ([place]    Status post lap band surgery   Nephrolithiasis    Non Hodgkin's lymphoma ([place]    on chemotherapy   Persistent atrial fibrillation ([place]    a. s/p PVI 2008 b. s/p convergent ablation [id number] by bradycardia requiring pacemaker implant   Personal history of radiation therapy    Presence of permanent cardiac pacemaker    Rotator cuff tear    Right   [place]    [place]   SVC  syndrome    with lung mass and non hodgkins lymphoma   Thyroid cancer ([place] 2000    Past Surgical History:  Procedure Laterality Date   ABDOMINAL HYSTERECTOMY  1983   afib ablation     a. 2008 PVI b. 2014 convergent ablation   APPENDECTOMY     BONE MARROW BIOPSY  02/21/2017   BREAST LUMPECTOMY Left 2010   [place]     2015- negative   CARDIOVERSION  10/09/2012   Procedure: CARDIOVERSION;  Surgeon: JMinus Breeding MD;  [place]  Service: Cardiovascular;  Laterality: N/A;   CARDIOVERSION  10/09/2012   Procedure: CARDIOVERSION;  Surgeon: JMinus Breeding MD;  [facility];  Service: Cardiovascular;  Laterality: N/A;  [name] the ok to add pt to the add on , but we must check to find out if the can add pt on at 1400 ([phone]   CARDIOVERSION N/A 11/20/2012   Procedure: CARDIOVERSION;  Surgeon: PFay Records MD;  [place]  Service: Cardiovascular;  Laterality: N/A;   CARDIOVERSION N/A 07/18/2017   Procedure: CARDIOVERSION;  Surgeon: NThayer Headings MD;  [place]  Service: Cardiovascular;  Laterality: N/A;  CARDIOVERSION N/A 10/03/2017   Procedure: CARDIOVERSION;  Surgeon: Sanda Klein, MD;  [facility];  Service: Cardiovascular;  Laterality: N/A;   CARDIOVERSION N/A 01/07/2018   Procedure: CARDIOVERSION;  Surgeon: Thayer Headings, MD;  Location: [facility] ENDOSCOPY;  Service: Cardiovascular;  Laterality: N/A;   CARDIOVERSION N/A 12/10/2019   Procedure: CARDIOVERSION;  Surgeon: Buford Dresser, MD;  Location: [facility] ENDOSCOPY;  Service: Cardiovascular;  Laterality: N/A;   CARDIOVERSION N/A 03/09/2021   Procedure: CARDIOVERSION;  Surgeon: Pixie Casino, MD;  Location: [place];  Service: Cardiovascular;  Laterality: N/A;   CHOLECYSTECTOMY     COLONOSCOPY W/ POLYPECTOMY     Dr Earlean Shawl   CYSTOSCOPY N/A 02/06/2015   Procedure: CYSTOSCOPY;  Surgeon: Kathie Rhodes, MD;  [facility]: [facility];  Service: Urology;   Laterality: N/A;   CYSTOSCOPY W/ RETROGRADES Left 11/17/2017   Procedure: CYSTOSCOPY WITH RETROGRADE /PYELOGRAM/;  Surgeon: Kathie Rhodes, MD;  Location: [facility];  Service: Urology;  Laterality: Left;   CYSTOSCOPY WITH RETROGRADE PYELOGRAM, URETEROSCOPY AND STENT PLACEMENT Right 02/06/2015   Procedure: RETROGRADE PYELOGRAM, RIGHT URETEROSCOPY STENT PLACEMENT;  Surgeon: Kathie Rhodes, MD;  [facility];  Service: Urology;  Laterality: Right;   CYSTOSCOPY WITH RETROGRADE PYELOGRAM, URETEROSCOPY AND STENT PLACEMENT Right 03/07/2017   Procedure: CYSTOSCOPY WITH RIGHT RETROGRADE PYELOGRAM,RIGHT  URETEROSCOPYLASER LITHOTRIPSY  AND STENT PLACEMENT AND STONE BASKETRY;  Surgeon: Kathie Rhodes, MD;  Location: [place];  Service: Urology;  Laterality: Right;   EYE SURGERY     cataract surgery   fatty mass removal  1999   pubic area   HOLMIUM LASER APPLICATION N/A 01/24/5464   Procedure: HOLMIUM LASER APPLICATION;  Surgeon: Kathie Rhodes, MD;  [facility];  Service: Urology;  Laterality: N/A;   HOLMIUM LASER APPLICATION Right 6/81/2751   Procedure: HOLMIUM LASER APPLICATION;  Surgeon: Kathie Rhodes, MD;  [facility];  Service: Urology;  Laterality: Right;   HOLMIUM LASER APPLICATION Left 7/00/1749   Procedure: HOLMIUM LASER APPLICATION;  Surgeon: Kathie Rhodes, MD;  [facility];  Service: Urology;  Laterality: Left;   I & D EXTREMITY Left 12/19/2020   Procedure: IRRIGATION AND DEBRIDEMENT OF LEFT HIP HEMATOMA WITH APPLICATION OF WOUND VAC;  Surgeon: Erle Crocker, MD;  Location: [place];  Service: Orthopedics;  Laterality: Left;   I & D EXTREMITY Left 02/06/2021   Procedure: IRRIGATION AND DEBRIDEMENT DEEP ABCESS LEFT THIGH, SECONDARY CLOSURE OF WOUND DEHISCENCE;  Surgeon: Erle Crocker, MD;  Location: [place];  Service: Orthopedics;  Laterality: Left;   IR FLUORO GUIDE PORT INSERTION RIGHT  02/24/2017   IR NEPHROSTOMY PLACEMENT RIGHT  11/17/2017   IR  PATIENT EVAL TECH 0-60 MINS  03/11/2017   IR REMOVAL TUN ACCESS W/ PORT W/O FL MOD SED  04/20/2018   IR US GUIDE VASC ACCESS RIGHT  02/24/2017   KNEE ARTHROSCOPY     bilateral   LAPAROSCOPIC GASTRIC BANDING  07/10/2010   LAPAROSCOPIC GASTRIC BANDING     Laparoscopic adjustable banding APS System with posterior hiatal hernia, 2 suture.   LAPAROTOMY     for ruptured ovary and ovarian artery    NEPHROLITHOTOMY Right 11/17/2017   Procedure: NEPHROLITHOTOMY PERCUTANEOUS;  Surgeon: Kathie Rhodes, MD;  [facility];  Service: Urology;  Laterality: Right;   PACEMAKER INSERTION  03/10/2013   MDT dual chamber PPM   POCKET REVISION N/A 12/08/2013   Procedure: POCKET REVISION;  Surgeon: Deboraha Sprang, MD;  Location: [facility] CATH LAB;  Service: Cardiovascular;  Laterality: N/A;   PORTA CATH INSERTION     REVERSE SHOULDER ARTHROPLASTY Right 05/14/2018   Procedure: RIGHT REVERSE SHOULDER ARTHROPLASTY;  Surgeon: Tania Ade, MD;  Location: [place];  Service: Orthopedics;  Laterality: Right;   REVERSE SHOULDER REPLACEMENT Right 05/14/2018   RIGHT HEART CATH N/A 07/21/2019   Procedure: RIGHT HEART CATH;  Surgeon: Larey Dresser, MD;  Location: [place] CV LAB;  Service: Cardiovascular;  Laterality: N/A;   TEE WITH CARDIOVERSION  09/22/2017   TEE WITHOUT CARDIOVERSION N/A 10/03/2017   Procedure: TRANSESOPHAGEAL ECHOCARDIOGRAM (TEE);  Surgeon: Sanda Klein, MD;  Location: [facility] ENDOSCOPY;  Service: Cardiovascular;  Laterality: N/A;   TEE WITHOUT CARDIOVERSION N/A 08/04/2019   Procedure: TRANSESOPHAGEAL ECHOCARDIOGRAM (TEE);  Surgeon: Larey Dresser, MD;  Location: [facility] ENDOSCOPY;  Service: Cardiovascular;  Laterality: N/A;   TEE WITHOUT CARDIOVERSION N/A 12/10/2019   Procedure: TRANSESOPHAGEAL ECHOCARDIOGRAM (TEE);  Surgeon: Buford Dresser, MD;  Location: [facility] ENDOSCOPY;  Service: Cardiovascular;  Laterality: N/A;   TEE WITHOUT CARDIOVERSION N/A 03/09/2021   Procedure: TRANSESOPHAGEAL ECHOCARDIOGRAM  (TEE);  Surgeon: Pixie Casino, MD;  Location: [place];  Service: Cardiovascular;  Laterality: N/A;   THYROIDECTOMY  1998   Dr Margot Chimes   TONSILLECTOMY     TOTAL KNEE ARTHROPLASTY  04/13/2012   Procedure: TOTAL KNEE ARTHROPLASTY;  Surgeon: Rudean Haskell, MD;  Location: [place];  Service: Orthopedics;  Laterality: Right;   VIDEO BRONCHOSCOPY WITH ENDOBRONCHIAL ULTRASOUND N/A 02/07/2017   Procedure: VIDEO BRONCHOSCOPY WITH ENDOBRONCHIAL ULTRASOUND;  Surgeon: Marshell Garfinkel, MD;  Location: [place];  Service: Pulmonary;  Laterality: N/A;    Allergies  Allergen Reactions   Ranolazine     Balance issues   Rivaroxaban     Nose Bleed X 6 hrs ; packing in ER Nasal hemorrhage   Tape Rash and Other (See Comments)    SKIN IS THIN AND TEARS EASILY (also does not tolerate paper tape well)     Tikosyn [Dofetilide] Other (See Comments)    "Long QT wave"    Epinephrine Other (See Comments)    Oral anesthetic Dental form only (liquid). Patient stated she will become "out of it" she can hear you but cannot respond in normal fashion. "not fully with it"   Ketamine Other (See Comments)    Hallucinations     Social History   Tobacco Use   Smoking status: Never   Smokeless tobacco: Never  Substance Use Topics   Alcohol use: No    Comment: none since 1990    Family History  Problem Relation Age of Onset   Heart disease Father 17       MI _0    Colon cancer Father        COLON   Heart attack Father    Other Mother        temporal arteritis    Diabetes Sister    Diabetes Brother    Diabetes Paternal Aunt    Diabetes Paternal Grandmother    Esophageal cancer Neg Hx    Inflammatory bowel disease Neg Hx    Liver disease Neg Hx    Pancreatic cancer Neg Hx    Stomach cancer Neg Hx      Review of Systems  Positive ROS: As above  All other systems have been reviewed and were otherwise negative with the exception of those mentioned in the HPI and as above.  Objective: Vital  signs in last 24 hours: Temp:  [98.8 F (37.1 C)] 98.8 F (37.1 C) (  06/22 1646) Pulse Rate:  [67-120] 73 (06/22 2145) Resp:  [11-32] 22 (06/22 2145) BP: (120-173)/(67-147) 123/69 (06/22 2145) SpO2:  [95 %-100 %] 100 % (06/22 2145)  General Appearance: Alert, cooperative, no distress, appears stated age Head: Normocephalic, without obvious abnormality, atraumatic Eyes: PERRL, conjunctiva/corneas clear, EOM's intact, fundi benign, both eyes      Lungs: respirations unlabored Heart: Regular rate and rhythm Abdomen: Soft, non-tender, bowel sounds active all four quadrants, no masses, no organomegaly Extremities: Extremities normal, atraumatic, no cyanosis or edema Pulses: 2+ and symmetric all extremities Skin: Skin color, texture, turgor normal, no rashes or lesions  NEUROLOGIC:   Mental status: A&O x4, no aphasia, good attention span, Memory and fund of knowledge is impaired Motor Exam - grossly normal, normal tone and bulk Sensory Exam - grossly normal Reflexes: symmetric, no pathologic reflexes, No Hoffman's, No clonus Coordination -not tested Gait -not tested Balance -not tested Cranial Nerves: I: smell Not tested  II: visual acuity  OS: na    OD: na  II: visual fields Full to confrontation  II: pupils Equal, round, reactive to light  III,VII: ptosis None  III,IV,VI: extraocular muscles  Full ROM  V: mastication Normal  V: facial light touch sensation  Normal  V,VII: corneal reflex  Present  VII: facial muscle function - upper  Normal  VII: facial muscle function - lower Normal  VIII: hearing Not tested  IX: soft palate elevation  Normal  IX,X: gag reflex Present  XI: trapezius strength  5/5  XI: sternocleidomastoid strength 5/5  XI: neck flexion strength  5/5  XII: tongue strength  Normal    Data Review Lab Results  Component Value Date   WBC 12.2 (H) 03/14/2021   HGB 10.6 (L) 03/14/2021   HCT 34.5 (L) 03/14/2021   MCV 89.6 03/14/2021   PLT 254 03/14/2021    Lab Results  Component Value Date   NA 138 03/14/2021   K 3.6 03/14/2021   CL 100 03/14/2021   CO2 29 03/14/2021   BUN 18 03/14/2021   CREATININE 0.98 03/14/2021   GLUCOSE 111 (H) 03/14/2021   Lab Results  Component Value Date   INR 1.2 02/09/2021   PROTIME 20.1 03/02/2009    Radiology: CT Angio Head W or Wo Contrast  Result Date: 03/14/2021 CLINICAL DATA:  Neuro deficit, acute stroke suspected. Hemorrhage seen on same day CT head. EXAM: CT ANGIOGRAPHY HEAD AND NECK TECHNIQUE: Multidetector CT imaging of the head and neck was performed using the standard protocol during bolus administration of intravenous contrast. Multiplanar CT image reconstructions and MIPs were obtained to evaluate the vascular anatomy. Carotid stenosis measurements (when applicable) are obtained utilizing NASCET criteria, using the distal internal carotid diameter as the denominator. CONTRAST:  44m OMNIPAQUE IOHEXOL 350 MG/ML SOLN COMPARISON:  None. FINDINGS: CTA NECK FINDINGS Aortic arch: Great vessel origins are patent. Right carotid system: No evidence of dissection, stenosis (50% or greater) or occlusion. Mild calcific atherosclerosis at the carotid bifurcation. Left carotid system: No evidence of dissection, stenosis (50% or greater) or occlusion. Mild calcific atherosclerosis at the carotid bifurcation. Vertebral arteries: Codominant. No evidence of dissection, stenosis (50% or greater) or occlusion. Skeleton: Severe degenerative disc disease at C5-C6 and C6-C7 with disc height loss and endplate spurring. Heterogeneous bone marrow and osteopenia. Other neck: No acute abnormality. Upper chest: Further evaluated on same day CT chest. Debris in the esophagus. Review of the MIP images confirms the above findings CTA HEAD FINDINGS Anterior circulation: Bilateral ICAs, MCAs, and  ACAs are patent without proximal hemodynamically significant stenosis. Calcific atherosclerosis of bilateral intracranial ICAs without greater  than 50% narrowing. No aneurysm. No visible vascular malformation in the region of the patient's known hemorrhage, although acute blood products limit evaluation. Posterior circulation: Patent intradural vertebral arteries and basilar artery. Mild calcific atherosclerotic narrowing of the basilar artery. Bilateral posterior cerebral arteries are patent. Mild-to-moderate left P1 and P2 PCA stenosis. Venous sinuses: As permitted by contrast timing, patent. Review of the MIP images confirms the above findings IMPRESSION: CTA Head: 1. No large vessel occlusion. Mild-to-moderate left P1 and P2 PCA stenosis. 2. No aneurysm or visible vascular malformation in the region of the patient's known hemorrhage, although acute blood products limits evaluation. CTA Neck: 1. No significant (greater than 50%) stenosis. 2. Please see same day CT chest for intrathoracic evaluation. Findings discussed with Dr. Karle Starch via telephone at 8:18 p.m. Electronically Signed   By: Margaretha Sheffield MD   On: 03/14/2021 20:27   CT Head Wo Contrast  Result Date: 03/14/2021 CLINICAL DATA:  75 year old female with altered mental status. EXAM: CT HEAD WITHOUT CONTRAST TECHNIQUE: Contiguous axial images were obtained from the base of the skull through the vertex without intravenous contrast. COMPARISON:  Head CT dated 12/15/2020 FINDINGS: Brain: There is moderate age-related atrophy and chronic microvascular ischemic changes. Acute intracranial hemorrhage extends from the region of the caudothalamic groove into the left lateral ventricle and through the left foramen Monro into the third ventricle. There is mild dilatation of the left lateral ventricle. There is approximately 5 mm left-to-right midline shift. Vascular: No hyperdense vessel or unexpected calcification. Skull: Normal. Negative for fracture or focal lesion. Sinuses/Orbits: No acute finding. Other: None IMPRESSION: 1. Acute intracranial hemorrhage extending from the region of the left  caudothalamic groove into the left lateral ventricle and through the left foramen Monro into the third ventricle. There is mild dilatation of the left lateral ventricle and approximately 5 mm left-to-right midline shift. Findings may be hypertensive in nature or secondary to rupture of A-comm aneurysm. Further evaluation with CT angiography is recommended. 2. Moderate age-related atrophy and chronic microvascular ischemic changes. These results were called by telephone at the time of interpretation on 03/14/2021 at 6:53 pm to Dr. Kathrynn Humble, who verbally acknowledged these results. Electronically Signed   By: Anner Crete M.D.   On: 03/14/2021 18:58   CT Angio Neck W and/or Wo Contrast  Result Date: 03/14/2021 CLINICAL DATA:  Neuro deficit, acute stroke suspected. Hemorrhage seen on same day CT head. EXAM: CT ANGIOGRAPHY HEAD AND NECK TECHNIQUE: Multidetector CT imaging of the head and neck was performed using the standard protocol during bolus administration of intravenous contrast. Multiplanar CT image reconstructions and MIPs were obtained to evaluate the vascular anatomy. Carotid stenosis measurements (when applicable) are obtained utilizing NASCET criteria, using the distal internal carotid diameter as the denominator. CONTRAST:  73m OMNIPAQUE IOHEXOL 350 MG/ML SOLN COMPARISON:  None. FINDINGS: CTA NECK FINDINGS Aortic arch: Great vessel origins are patent. Right carotid system: No evidence of dissection, stenosis (50% or greater) or occlusion. Mild calcific atherosclerosis at the carotid bifurcation. Left carotid system: No evidence of dissection, stenosis (50% or greater) or occlusion. Mild calcific atherosclerosis at the carotid bifurcation. Vertebral arteries: Codominant. No evidence of dissection, stenosis (50% or greater) or occlusion. Skeleton: Severe degenerative disc disease at C5-C6 and C6-C7 with disc height loss and endplate spurring. Heterogeneous bone marrow and osteopenia. Other neck: No  acute abnormality. Upper chest: Further evaluated on same day CT  chest. Debris in the esophagus. Review of the MIP images confirms the above findings CTA HEAD FINDINGS Anterior circulation: Bilateral ICAs, MCAs, and ACAs are patent without proximal hemodynamically significant stenosis. Calcific atherosclerosis of bilateral intracranial ICAs without greater than 50% narrowing. No aneurysm. No visible vascular malformation in the region of the patient's known hemorrhage, although acute blood products limit evaluation. Posterior circulation: Patent intradural vertebral arteries and basilar artery. Mild calcific atherosclerotic narrowing of the basilar artery. Bilateral posterior cerebral arteries are patent. Mild-to-moderate left P1 and P2 PCA stenosis. Venous sinuses: As permitted by contrast timing, patent. Review of the MIP images confirms the above findings IMPRESSION: CTA Head: 1. No large vessel occlusion. Mild-to-moderate left P1 and P2 PCA stenosis. 2. No aneurysm or visible vascular malformation in the region of the patient's known hemorrhage, although acute blood products limits evaluation. CTA Neck: 1. No significant (greater than 50%) stenosis. 2. Please see same day CT chest for intrathoracic evaluation. Findings discussed with Dr. Karle Starch via telephone at 8:18 p.m. Electronically Signed   By: Margaretha Sheffield MD   On: 03/14/2021 20:27   CT CHEST W CONTRAST  Result Date: 03/14/2021 CLINICAL DATA:  Slipped and fell. Patient is alert and oriented x4. Cough for unknown period of time. EXAM: CT CHEST WITH CONTRAST TECHNIQUE: Multidetector CT imaging of the chest was performed during intravenous contrast administration. CONTRAST:  42m OMNIPAQUE IOHEXOL 350 MG/ML SOLN COMPARISON:  None. FINDINGS: Cardiovascular: No significant vascular findings. Normal heart size. No pericardial effusion. Thoracic aortic atherosclerosis. Multi vessel coronary artery atherosclerosis. Two lead cardiac pacemaker.  Mediastinum/Nodes: No enlarged mediastinal, hilar, or axillary lymph nodes. Thyroid gland, trachea, and esophagus demonstrate no significant findings. Lungs/Pleura: Small right pleural effusion. Trace left pleural effusion. No focal consolidation or pneumothorax. Upper Abdomen: No acute upper abdominal abnormality. 10 mm cyst in the left hepatic lobe. Musculoskeletal: No acute osseous abnormality. No aggressive osseous lesion. Generalized osteopenia. Anterior bridging osteophytes of the lower thoracic spine. Right shoulder arthroplasty. IMPRESSION: 1. Small right and trace left pleural effusion. 2.  Aortic Atherosclerosis (ICD10-I70.0). Electronically Signed   By: HKathreen Devoid  On: 03/14/2021 19:57     Assessment/Plan:  75year old female resented to the ED after being found down at home.  CT head shows an acute intracranial hemorrhage that extends into the left lateral ventricle and left foramen of Monro into the third ventricle.  She has very mild dilation of her left lateral ventricle with about 5 mm of left-to-right shift.  CTA was negative.  Eliquis was reversed.  [facility]  She is currently on Cleviprex drip for her hypertension.  No surgical intervention warranted.  I do not think she needs a ventriculostomy at this time either.  Repeat head CT in the morning.  KOcie CornfieldMCornerstone Hospital Of [facility]6/22/2022 9:52 PM

## 2021-03-14 NOTE — ED Provider Notes (Signed)
[facility] EMERGENCY DEPARTMENT Provider Note  CSN: [id number] Arrival date & time: 03/14/21 1626    History Chief Complaint  Patient presents with   Weakness    HPI  Kayla Harrison is a 75 y.o. female with history of multiple medical problems, most recently admitted for infected wound on L thigh that required operative debridement, wound vac and inpatient then rehab admission 5/16-6/3. She lives at home alone with home health services several times per week to help with wound care. She was also admitted on 6/17 for TEE and DCCV for persistent afib. She reports in the last two days she has been feeling generally weak, foggy headed and dizzy. She is not able to further describe her symptoms and gives incomplete answers at times. She was apparently was on the floor today for several hours but is unable to tell me how she got there or how long she was there. EMS reported she had slid out of bed, but she has not recollection of this. She denies any fever, CP, SOB, N/V/D or dysuria.    Past Medical History:  Diagnosis Date   Anemia    Arthritis    osteoarthritis - knees and right shoulder   Blood transfusion without reported diagnosis    Breast cancer ([place])    Dr Margot Chimes, total thyroidectomy- 1999- for cancer   Brucellosis 1964   Chronic bilateral pleural effusions    Colon polyp    Dr Earlean Shawl   Complication of anesthesia    Ketamine produces LSD reaction, bright colored nightmarish experience    Dyslipidemia    Endometriosis    Fibroids    H/O pleural effusion    s/p thoracentesis w 3270m withdrawn   Hepatitis    [place] ?hepatitis    History of dysphagia    due to radiation therapy   History of hiatal hernia    small noted on PET scan   Hypertension    Hypothyroidism    [place]   radiation RX completed 03/04/17; will start chemo 6/27, pt unaware of lung cancer   Morbid obesity ([place]    Status post  lap band surgery   Nephrolithiasis    Non Hodgkin's lymphoma ([place]    on chemotherapy   Persistent atrial fibrillation ([place]    a. s/p PVI 2008 b. s/p convergent ablation 24163complicated by bradycardia requiring pacemaker implant   Personal history of radiation therapy    Presence of permanent cardiac pacemaker    Rotator cuff tear    Right   [place]   SVC syndrome    with lung mass and non hodgkins lymphoma   Thyroid cancer ([place] 2000    Past Surgical History:  Procedure Laterality Date   ABDOMINAL HYSTERECTOMY  1983   afib ablation     a. 2008 PVI b. 2014 convergent ablation   APPENDECTOMY     BONE MARROW BIOPSY  02/21/2017   BREAST LUMPECTOMY Left 2010   [place]    2015- negative   CARDIOVERSION  10/09/2012   Procedure: CARDIOVERSION;  Surgeon: JMinus Breeding MD;  [facility]  Service: Cardiovascular;  Laterality: N/A;   CARDIOVERSION  10/09/2012   Procedure: CARDIOVERSION;  Surgeon: JMinus Breeding MD;  [place] [facility]  Service: Cardiovascular;  Laterality: N/A;Ronalee Beltsgave the ok to add pt to the add on , but  we must check to find out if the can add pt on at [phone])   CARDIOVERSION N/A 11/20/2012   Procedure: CARDIOVERSION;  Surgeon: Fay Records, MD;  Location: [place];  Service: Cardiovascular;  Laterality: N/A;   CARDIOVERSION N/A 07/18/2017   Procedure: CARDIOVERSION;  Surgeon: Thayer Headings, MD;  Location: [place];  Service: Cardiovascular;  Laterality: N/A;   CARDIOVERSION N/A 10/03/2017   Procedure: CARDIOVERSION;  Surgeon: Sanda Klein, MD;  [facility];  Service: Cardiovascular;  Laterality: N/A;   CARDIOVERSION N/A 01/07/2018   Procedure: CARDIOVERSION;  Surgeon: Thayer Headings, MD;  Location: [facility] ENDOSCOPY;  Service: Cardiovascular;  Laterality: N/A;   CARDIOVERSION N/A 12/10/2019   Procedure: CARDIOVERSION;  Surgeon: Buford Dresser, MD;  Location: [facility]  ENDOSCOPY;  Service: Cardiovascular;  Laterality: N/A;   CARDIOVERSION N/A 03/09/2021   Procedure: CARDIOVERSION;  Surgeon: Pixie Casino, MD;  Location: [place];  Service: Cardiovascular;  Laterality: N/A;   CHOLECYSTECTOMY     COLONOSCOPY W/ POLYPECTOMY     Dr Earlean Shawl   CYSTOSCOPY N/A 02/06/2015   Procedure: CYSTOSCOPY;  Surgeon: Kathie Rhodes, MD;  [facility];  Service: Urology;  Laterality: N/A;   CYSTOSCOPY W/ RETROGRADES Left 11/17/2017   Procedure: CYSTOSCOPY WITH RETROGRADE /PYELOGRAM/;  Surgeon: Kathie Rhodes, MD;  [facility];  Service: Urology;  Laterality: Left;   CYSTOSCOPY WITH RETROGRADE PYELOGRAM, URETEROSCOPY AND STENT PLACEMENT Right 02/06/2015   Procedure: RETROGRADE PYELOGRAM, RIGHT URETEROSCOPY STENT PLACEMENT;  Surgeon: Kathie Rhodes, MD;  [facility];  Service: Urology;  Laterality: Right;   CYSTOSCOPY WITH RETROGRADE PYELOGRAM, URETEROSCOPY AND STENT PLACEMENT Right 03/07/2017   Procedure: CYSTOSCOPY WITH RIGHT RETROGRADE PYELOGRAM,RIGHT  URETEROSCOPYLASER LITHOTRIPSY  AND STENT PLACEMENT AND STONE BASKETRY;  Surgeon: Kathie Rhodes, MD;  Location: [place];  Service: Urology;  Laterality: Right;   EYE SURGERY     cataract surgery   fatty mass removal  1999   pubic area   HOLMIUM LASER APPLICATION N/A 6/50/3546   Procedure: HOLMIUM LASER APPLICATION;  Surgeon: Kathie Rhodes, MD;  [facility];  Service: Urology;  Laterality: N/A;   HOLMIUM LASER APPLICATION Right 5/68/1275   Procedure: HOLMIUM LASER APPLICATION;  Surgeon: Kathie Rhodes, MD;  Location[facility];  Service: Urology;  Laterality: Right;   HOLMIUM LASER APPLICATION Left 1/70/0174   Procedure: HOLMIUM LASER APPLICATION;  Surgeon: Kathie Rhodes, MD;  [facility];  Service: Urology;  Laterality: Left;   I & D EXTREMITY Left 12/19/2020   Procedure: IRRIGATION AND DEBRIDEMENT OF LEFT HIP HEMATOMA WITH APPLICATION OF WOUND VAC;  Surgeon: Erle Crocker, MD;  Location: [place];  Service: Orthopedics;  Laterality: Left;   I & D EXTREMITY Left 02/06/2021   Procedure: IRRIGATION AND DEBRIDEMENT DEEP ABCESS LEFT THIGH, SECONDARY CLOSURE OF WOUND DEHISCENCE;  Surgeon: Erle Crocker, MD;  Location: [place];  Service: Orthopedics;  Laterality: Left;   IR FLUORO GUIDE PORT INSERTION RIGHT  02/24/2017   IR NEPHROSTOMY PLACEMENT RIGHT  11/17/2017   IR PATIENT EVAL TECH 0-60 MINS  03/11/2017   IR REMOVAL TUN ACCESS W/ PORT W/O FL MOD SED  04/20/2018   IR US GUIDE VASC ACCESS RIGHT  02/24/2017   KNEE ARTHROSCOPY     bilateral   LAPAROSCOPIC GASTRIC BANDING  07/10/2010   LAPAROSCOPIC GASTRIC BANDING     Laparoscopic adjustable banding APS System with posterior hiatal hernia, 2 suture.   LAPAROTOMY     for ruptured  ovary and ovarian artery    NEPHROLITHOTOMY Right 11/17/2017   Procedure: NEPHROLITHOTOMY PERCUTANEOUS;  Surgeon: Kathie Rhodes, MD;  [facility];  Service: Urology;  Laterality: Right;   PACEMAKER INSERTION  03/10/2013   MDT dual chamber PPM   POCKET REVISION N/A 12/08/2013   Procedure: POCKET REVISION;  Surgeon: Deboraha Sprang, MD;  Location[facility] CATH LAB;  Service: Cardiovascular;  Laterality: N/A;   PORTA CATH INSERTION     REVERSE SHOULDER ARTHROPLASTY Right 05/14/2018   Procedure: RIGHT REVERSE SHOULDER ARTHROPLASTY;  Surgeon: Tania Ade, MD;  Location: [place];  Service: Orthopedics;  Laterality: Right;   REVERSE SHOULDER REPLACEMENT Right 05/14/2018   RIGHT HEART CATH N/A 07/21/2019   Procedure: RIGHT HEART CATH;  Surgeon: Larey Dresser, MD;  Location: [place] CV LAB;  Service: Cardiovascular;  Laterality: N/A;   TEE WITH CARDIOVERSION  09/22/2017   TEE WITHOUT CARDIOVERSION N/A 10/03/2017   Procedure: TRANSESOPHAGEAL ECHOCARDIOGRAM (TEE);  Surgeon: Sanda Klein, MD;  Location: [facility] ENDOSCOPY;  Service: Cardiovascular;  Laterality: N/A;   TEE WITHOUT CARDIOVERSION N/A 08/04/2019   Procedure: TRANSESOPHAGEAL  ECHOCARDIOGRAM (TEE);  Surgeon: Larey Dresser, MD;  Location: [facility] ENDOSCOPY;  Service: Cardiovascular;  Laterality: N/A;   TEE WITHOUT CARDIOVERSION N/A 12/10/2019   Procedure: TRANSESOPHAGEAL ECHOCARDIOGRAM (TEE);  Surgeon: Buford Dresser, MD;  Location: [facility] ENDOSCOPY;  Service: Cardiovascular;  Laterality: N/A;   TEE WITHOUT CARDIOVERSION N/A 03/09/2021   Procedure: TRANSESOPHAGEAL ECHOCARDIOGRAM (TEE);  Surgeon: Pixie Casino, MD;  Location: [place];  Service: Cardiovascular;  Laterality: N/A;   THYROIDECTOMY  1998   Dr Margot Chimes   TONSILLECTOMY     TOTAL KNEE ARTHROPLASTY  04/13/2012   Procedure: TOTAL KNEE ARTHROPLASTY;  Surgeon: Rudean Haskell, MD;  Location: [place];  Service: Orthopedics;  Laterality: Right;   VIDEO BRONCHOSCOPY WITH ENDOBRONCHIAL ULTRASOUND N/A 02/07/2017   Procedure: VIDEO BRONCHOSCOPY WITH ENDOBRONCHIAL ULTRASOUND;  Surgeon: Marshell Garfinkel, MD;  Location: [place];  Service: Pulmonary;  Laterality: N/A;    Family History  Problem Relation Age of Onset   Heart disease Father 47       MI _0    Colon cancer Father        COLON   Heart attack Father    Other Mother        temporal arteritis    Diabetes Sister    Diabetes Brother    Diabetes Paternal Aunt    Diabetes Paternal Grandmother    Esophageal cancer Neg Hx    Inflammatory bowel disease Neg Hx    Liver disease Neg Hx    Pancreatic cancer Neg Hx    Stomach cancer Neg Hx     Social History   Tobacco Use   Smoking status: Never   Smokeless tobacco: Never  Vaping Use   Vaping Use: Never used  Substance Use Topics   Alcohol use: No    Comment: none since 1990   Drug use: Never     Home Medications Prior to Admission medications   Medication Sig Start Date End Date Taking? Authorizing Provider  amiodarone (PACERONE) 200 MG tablet Take 1 tablet (200 mg total) by mouth in the morning and at bedtime. 03/09/21   Hilty, Nadean Corwin, MD  apixaban (ELIQUIS) 5 MG TABS tablet Take 5 mg by  mouth 2 (two) times daily.    [provider]  Emollient (OINTMENT BASE EX) Apply 1 application topically daily as needed (changing of dressing on wound). [place] Ointment Wound  [provider]  furosemide (LASIX) 20 MG tablet Take 20 mg by mouth daily as needed for fluid or edema.  07/21/19   [provider]  HYDROcodone-acetaminophen (NORCO/VICODIN) 5-325 MG tablet Take 1 tablet by mouth 2 (two) times daily as needed for severe pain. 02/23/21   Love, Ivan Anchors, PA-C  levothyroxine (SYNTHROID) 137 MCG tablet Take 68.5-137 mcg by mouth See admin instructions. Taking 137 mcg tablet daily except on Saturday taking 1/2 tablet    [provider]  nystatin (MYCOSTATIN/NYSTOP) powder Apply 1 application topically 3 (three) times daily as needed (rash/irritation (skin folds)). 03/09/21   Hilty, Nadean Corwin, MD  rosuvastatin (CRESTOR) 10 MG tablet Take 10 mg by mouth 2 (two) times a week. Mondays & Thursdays 06/30/18   [provider]  senna (SENOKOT) 8.6 MG TABS tablet Take 2 tablets (17.2 mg total) by mouth at bedtime. For constipation--adjust as needed. Purchase OTC 02/23/21   Love, Ivan Anchors, PA-C     Allergies    Ranolazine, Rivaroxaban, Tape, Tikosyn [dofetilide], Epinephrine, and Ketamine   Review of Systems   Review of Systems A comprehensive review of systems was completed and negative except as noted in HPI.    Physical Exam BP 123/69   Pulse 73   Temp 98.8 F (37.1 C) (Oral)   Resp (!) 22   SpO2 100%   Physical Exam Vitals and nursing note reviewed.  Constitutional:      Appearance: Normal appearance.  HENT:     Head: Normocephalic and atraumatic.     Nose: Nose normal.     Mouth/Throat:     Mouth: Mucous membranes are moist.  Eyes:     Extraocular Movements: Extraocular movements intact.     Conjunctiva/sclera: Conjunctivae normal.  Cardiovascular:     Rate and Rhythm: Normal rate.  Pulmonary:     Effort: Pulmonary effort is  normal.     Breath sounds: Normal breath sounds.  Abdominal:     General: Abdomen is flat.     Palpations: Abdomen is soft.     Tenderness: There is no abdominal tenderness.  Musculoskeletal:        General: No swelling. Normal range of motion.     Cervical back: Neck supple.  Skin:    General: Skin is warm and dry.     Comments: Wound vac on L lateral thigh, minimal surrounding erythema, no significant warmth, no fluctuance or drainage.   Neurological:     General: No focal deficit present.     Mental Status: She is alert and oriented to person, place, and time.     Cranial Nerves: No cranial nerve deficit.     Sensory: No sensory deficit.     Motor: No weakness.  Psychiatric:        Mood and Affect: Mood normal.      ED Results / Procedures / Treatments   Labs (all labs ordered are listed, but only abnormal results are displayed) Labs Reviewed  COMPREHENSIVE METABOLIC PANEL - Abnormal; Notable for the following components:      Result Value   Glucose, Bld 111 (*)    GFR, Estimated 60 (*)    All other components within normal limits  CBC WITH DIFFERENTIAL/PLATELET - Abnormal; Notable for the following components:   WBC 12.2 (*)    RBC 3.85 (*)    Hemoglobin 10.6 (*)    HCT 34.5 (*)    RDW 16.7 (*)    Neutro Abs 9.9 (*)  Monocytes Absolute 1.3 (*)    All other components within normal limits  URINALYSIS, ROUTINE W REFLEX MICROSCOPIC - Abnormal; Notable for the following components:   Color, Urine STRAW (*)    All other components within normal limits  SARS CORONAVIRUS 2 (TAT 6-24 HRS)  ETHANOL  RAPID URINE DRUG SCREEN, HOSP PERFORMED  CK  TROPONIN I (HIGH SENSITIVITY)  TROPONIN I (HIGH SENSITIVITY)    EKG EKG Interpretation  Date/Time:  Wednesday March 14 2021 17:45:03 EDT Ventricular Rate:  117 PR Interval:  122 QRS Duration: 107 QT Interval:  389 QTC Calculation: 543 R Axis:   -18 Text Interpretation: Sinus tachycardia with irregular rate Borderline  left axis deviation Borderline ST depression, anterolateral leads Prolonged QT interval Artifact in lead(s) I III aVR aVL aVF Since last tracing pacer spikes no longer present Interpretation limited secondary to artifact Confirmed by Calvert Cantor [phone]) on 03/14/2021 5:52:56 PM  Radiology CT Angio Head W or Wo Contrast  Result Date: 03/14/2021 CLINICAL DATA:  Neuro deficit, acute stroke suspected. Hemorrhage seen on same day CT head. EXAM: CT ANGIOGRAPHY HEAD AND NECK TECHNIQUE: Multidetector CT imaging of the head and neck was performed using the standard protocol during bolus administration of intravenous contrast. Multiplanar CT image reconstructions and MIPs were obtained to evaluate the vascular anatomy. Carotid stenosis measurements (when applicable) are obtained utilizing NASCET criteria, using the distal internal carotid diameter as the denominator. CONTRAST:  48m OMNIPAQUE IOHEXOL 350 MG/ML SOLN COMPARISON:  None. FINDINGS: CTA NECK FINDINGS Aortic arch: Great vessel origins are patent. Right carotid system: No evidence of dissection, stenosis (50% or greater) or occlusion. Mild calcific atherosclerosis at the carotid bifurcation. Left carotid system: No evidence of dissection, stenosis (50% or greater) or occlusion. Mild calcific atherosclerosis at the carotid bifurcation. Vertebral arteries: Codominant. No evidence of dissection, stenosis (50% or greater) or occlusion. Skeleton: Severe degenerative disc disease at C5-C6 and C6-C7 with disc height loss and endplate spurring. Heterogeneous bone marrow and osteopenia. Other neck: No acute abnormality. Upper chest: Further evaluated on same day CT chest. Debris in the esophagus. Review of the MIP images confirms the above findings CTA HEAD FINDINGS Anterior circulation: Bilateral ICAs, MCAs, and ACAs are patent without proximal hemodynamically significant stenosis. Calcific atherosclerosis of bilateral intracranial ICAs without greater than 50%  narrowing. No aneurysm. No visible vascular malformation in the region of the patient's known hemorrhage, although acute blood products limit evaluation. Posterior circulation: Patent intradural vertebral arteries and basilar artery. Mild calcific atherosclerotic narrowing of the basilar artery. Bilateral posterior cerebral arteries are patent. Mild-to-moderate left P1 and P2 PCA stenosis. Venous sinuses: As permitted by contrast timing, patent. Review of the MIP images confirms the above findings IMPRESSION: CTA Head: 1. No large vessel occlusion. Mild-to-moderate left P1 and P2 PCA stenosis. 2. No aneurysm or visible vascular malformation in the region of the patient's known hemorrhage, although acute blood products limits evaluation. CTA Neck: 1. No significant (greater than 50%) stenosis. 2. Please see same day CT chest for intrathoracic evaluation. Findings discussed with Dr. SKarle Starchvia telephone at 8:18 p.m. Electronically Signed   By: FMargaretha SheffieldMD   On: 03/14/2021 20:27   CT Head Wo Contrast  Result Date: 03/14/2021 CLINICAL DATA:  75year old female with altered mental status. EXAM: CT HEAD WITHOUT CONTRAST TECHNIQUE: Contiguous axial images were obtained from the base of the skull through the vertex without intravenous contrast. COMPARISON:  Head CT dated 12/15/2020 FINDINGS: Brain: There is moderate age-related atrophy and chronic microvascular  ischemic changes. Acute intracranial hemorrhage extends from the region of the caudothalamic groove into the left lateral ventricle and through the left foramen Monro into the third ventricle. There is mild dilatation of the left lateral ventricle. There is approximately 5 mm left-to-right midline shift. Vascular: No hyperdense vessel or unexpected calcification. Skull: Normal. Negative for fracture or focal lesion. Sinuses/Orbits: No acute finding. Other: None IMPRESSION: 1. Acute intracranial hemorrhage extending from the region of the left  caudothalamic groove into the left lateral ventricle and through the left foramen Monro into the third ventricle. There is mild dilatation of the left lateral ventricle and approximately 5 mm left-to-right midline shift. Findings may be hypertensive in nature or secondary to rupture of A-comm aneurysm. Further evaluation with CT angiography is recommended. 2. Moderate age-related atrophy and chronic microvascular ischemic changes. These results were called by telephone at the time of interpretation on 03/14/2021 at 6:53 pm to Dr. Kathrynn Humble, who verbally acknowledged these results. Electronically Signed   By: Anner Crete M.D.   On: 03/14/2021 18:58   CT Angio Neck W and/or Wo Contrast  Result Date: 03/14/2021 CLINICAL DATA:  Neuro deficit, acute stroke suspected. Hemorrhage seen on same day CT head. EXAM: CT ANGIOGRAPHY HEAD AND NECK TECHNIQUE: Multidetector CT imaging of the head and neck was performed using the standard protocol during bolus administration of intravenous contrast. Multiplanar CT image reconstructions and MIPs were obtained to evaluate the vascular anatomy. Carotid stenosis measurements (when applicable) are obtained utilizing NASCET criteria, using the distal internal carotid diameter as the denominator. CONTRAST:  22m OMNIPAQUE IOHEXOL 350 MG/ML SOLN COMPARISON:  None. FINDINGS: CTA NECK FINDINGS Aortic arch: Great vessel origins are patent. Right carotid system: No evidence of dissection, stenosis (50% or greater) or occlusion. Mild calcific atherosclerosis at the carotid bifurcation. Left carotid system: No evidence of dissection, stenosis (50% or greater) or occlusion. Mild calcific atherosclerosis at the carotid bifurcation. Vertebral arteries: Codominant. No evidence of dissection, stenosis (50% or greater) or occlusion. Skeleton: Severe degenerative disc disease at C5-C6 and C6-C7 with disc height loss and endplate spurring. Heterogeneous bone marrow and osteopenia. Other neck: No  acute abnormality. Upper chest: Further evaluated on same day CT chest. Debris in the esophagus. Review of the MIP images confirms the above findings CTA HEAD FINDINGS Anterior circulation: Bilateral ICAs, MCAs, and ACAs are patent without proximal hemodynamically significant stenosis. Calcific atherosclerosis of bilateral intracranial ICAs without greater than 50% narrowing. No aneurysm. No visible vascular malformation in the region of the patient's known hemorrhage, although acute blood products limit evaluation. Posterior circulation: Patent intradural vertebral arteries and basilar artery. Mild calcific atherosclerotic narrowing of the basilar artery. Bilateral posterior cerebral arteries are patent. Mild-to-moderate left P1 and P2 PCA stenosis. Venous sinuses: As permitted by contrast timing, patent. Review of the MIP images confirms the above findings IMPRESSION: CTA Head: 1. No large vessel occlusion. Mild-to-moderate left P1 and P2 PCA stenosis. 2. No aneurysm or visible vascular malformation in the region of the patient's known hemorrhage, although acute blood products limits evaluation. CTA Neck: 1. No significant (greater than 50%) stenosis. 2. Please see same day CT chest for intrathoracic evaluation. Findings discussed with Dr. SKarle Starchvia telephone at 8:18 p.m. Electronically Signed   By: FMargaretha SheffieldMD   On: 03/14/2021 20:27   CT CHEST W CONTRAST  Result Date: 03/14/2021 CLINICAL DATA:  Slipped and fell. Patient is alert and oriented x4. Cough for unknown period of time. EXAM: CT CHEST WITH CONTRAST TECHNIQUE: Multidetector CT  imaging of the chest was performed during intravenous contrast administration. CONTRAST:  5m OMNIPAQUE IOHEXOL 350 MG/ML SOLN COMPARISON:  None. FINDINGS: Cardiovascular: No significant vascular findings. Normal heart size. No pericardial effusion. Thoracic aortic atherosclerosis. Multi vessel coronary artery atherosclerosis. Two lead cardiac pacemaker.  Mediastinum/Nodes: No enlarged mediastinal, hilar, or axillary lymph nodes. Thyroid gland, trachea, and esophagus demonstrate no significant findings. Lungs/Pleura: Small right pleural effusion. Trace left pleural effusion. No focal consolidation or pneumothorax. Upper Abdomen: No acute upper abdominal abnormality. 10 mm cyst in the left hepatic lobe. Musculoskeletal: No acute osseous abnormality. No aggressive osseous lesion. Generalized osteopenia. Anterior bridging osteophytes of the lower thoracic spine. Right shoulder arthroplasty. IMPRESSION: 1. Small right and trace left pleural effusion. 2.  Aortic Atherosclerosis (ICD10-I70.0). Electronically Signed   By: HKathreen Devoid  On: 03/14/2021 19:57    Procedures .Critical Care  Date/Time: 03/14/2021 10:17 PM Performed by: STruddie Hidden MD Authorized by: STruddie Hidden MD   Critical care provider statement:    Critical care time (minutes):  45   Critical care time was exclusive of:  Separately billable procedures and treating other patients   Critical care was necessary to treat or prevent imminent or life-threatening deterioration of the following conditions:  CNS failure or compromise   Critical care was time spent personally by me on the following activities:  Discussions with consultants, evaluation of patient's response to treatment, examination of patient, ordering and performing treatments and interventions, ordering and review of laboratory studies, ordering and review of radiographic studies, pulse oximetry, re-evaluation of patient's condition, obtaining history from patient or surrogate and review of old charts   Care discussed with: admitting provider    Medications Ordered in the ED Medications  sodium chloride (PF) 0.9 % injection (has no administration in time range)  clevidipine (CLEVIPREX) infusion 0.5 mg/mL (2 mg/hr Intravenous Rate/Dose Change 03/14/21 2206)  iohexol (OMNIPAQUE) 350 MG/ML injection 80 mL (80 mLs  Intravenous Contrast Given 03/14/21 1914)  prothrombin complex conc human (KCENTRA) IVPB 4,416 Units (0 Units Intravenous Stopped 03/14/21 2038)     MDM Rules/Calculators/A&P MDM  Patient with general weakness, foggy headedness, and dizziness of unclear etiology. Will check labs, CT head and reassess. No fever to suggest sepsis from her wound vac.   ED Course  I have reviewed the triage vital signs and the nursing notes.  Pertinent labs & imaging results that were available during my care of the patient were reviewed by me and considered in my medical decision making (see chart for details).  Clinical Course as of 03/14/21 2218  Wed Mar 14, 2021  1815 CBC with mild leukocytosis [CS]  1816 UA is clear.  [CS]  1923 CT shows hemorrhage, either hypertensive or aneurysmal. Radiology is requesting CTA. She has signs of a moderate pleural effusion on scout imaging, CT chest added. Initial BP not significantly elevated but repeat readings are higher. Will initiate Cleviprex for BP control.  [CS]  1924 Kcentra ordered for Eliquis reversal.  [CS]  1925 CMP, CK, Trop are neg. EtOH and UDS normal.  [CS]  1945 Discussed results with patient who expresses understanding.  [CS]  2044 CTA neg for aneurysm. Spoke with Meyran, Neurosurgery who will review imaging and call back with recommendations.  [CS]  2108 Spoke with Dr. KLeonel Ramsay [facility]rology[facility]. He will see the patient on arrival there.  [CS]    Clinical Course User Index [CS] STruddie Hidden MD  Final Clinical Impression(s) / ED Diagnoses Final diagnoses:  Intracranial hemorrhage ([place])  Hypertension, unspecified type  Coagulopathy [facility])    Rx / DC Orders ED Discharge Orders     None        Truddie Hidden, MD 03/14/21 2218

## 2021-03-14 NOTE — Telephone Encounter (Signed)
    CN telephoned patient @ 11:30 AM to to inquire how she has been feeling since the cardioversion procedure.  Patient reports she has been doing quite well and she was on her way to a follow up appointment today.  Patient expressed gratitude for CN's telephone call.

## 2021-03-14 NOTE — ED Notes (Addendum)
Pt is using the bathroom

## 2021-03-15 ENCOUNTER — Telehealth: Payer: Self-pay | Admitting: *Deleted

## 2021-03-15 ENCOUNTER — Inpatient Hospital Stay (HOSPITAL_COMMUNITY): Payer: Medicare Other

## 2021-03-15 DIAGNOSIS — I629 Nontraumatic intracranial hemorrhage, unspecified: Secondary | ICD-10-CM

## 2021-03-15 DIAGNOSIS — L8990 Pressure ulcer of unspecified site, unspecified stage: Secondary | ICD-10-CM | POA: Insufficient documentation

## 2021-03-15 DIAGNOSIS — S06360A Traumatic hemorrhage of cerebrum, unspecified, without loss of consciousness, initial encounter: Secondary | ICD-10-CM

## 2021-03-15 LAB — MRSA NEXT GEN BY PCR, NASAL: MRSA by PCR Next Gen: NOT DETECTED

## 2021-03-15 LAB — SARS CORONAVIRUS 2 (TAT 6-24 HRS): SARS Coronavirus 2: NEGATIVE

## 2021-03-15 IMAGING — CT CT CHEST W/ CM
2 of 3 series · 14 of 36 positions shown, 17 images · IV contrast (omnipaque)
Comparison: 04/02/2019

CLINICAL DATA: History of lymphoma, annual evaluation. Increased
shortness of breath since [REDACTED].

EXAM:
CT CHEST WITH CONTRAST
TECHNIQUE: Multidetector CT imaging of the chest was performed during
intravenous contrast administration.
CONTRAST:  60mL OMNIPAQUE IOHEXOL 300 MG/ML  SOLN

[Series 2: axial st · axial · 0.77mm/px · z∈[+54,+310]mm · 11 of 152 slices shown, 14 images]
[im 12/152  mediastinal]
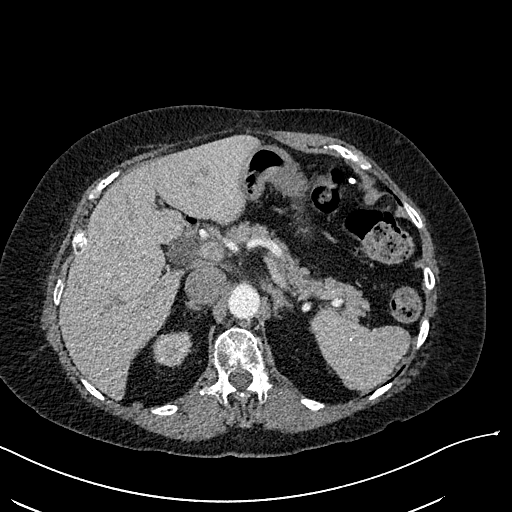
[im 12/152  lung]
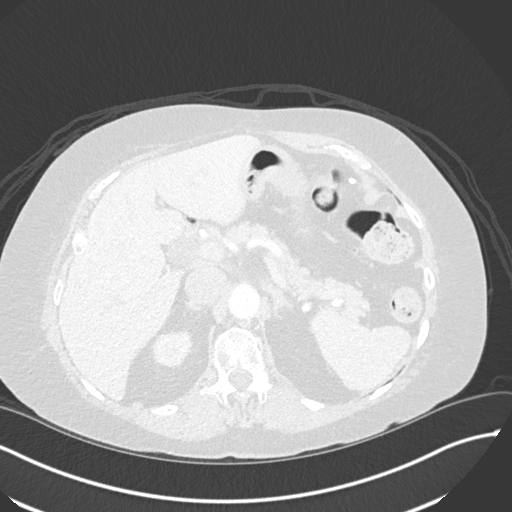
[im 23/152  lung]
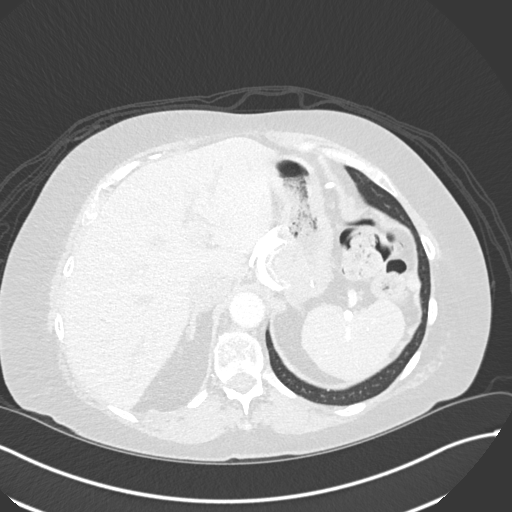
[im 34/152  lung]
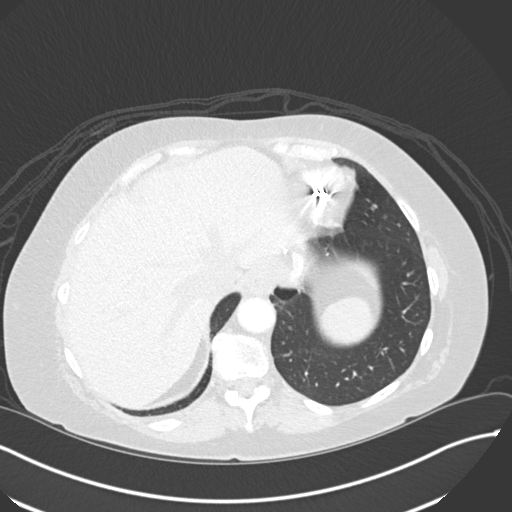
[im 51/152  lung]
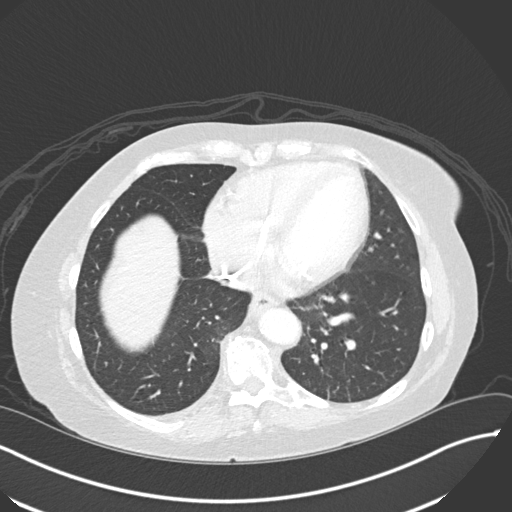
[im 62/152  mediastinal]
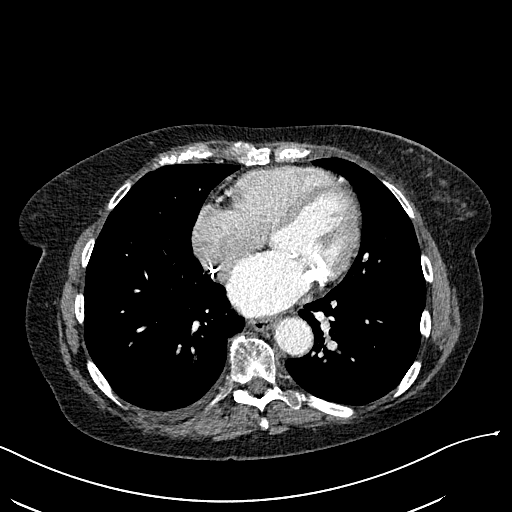
[im 62/152  lung]
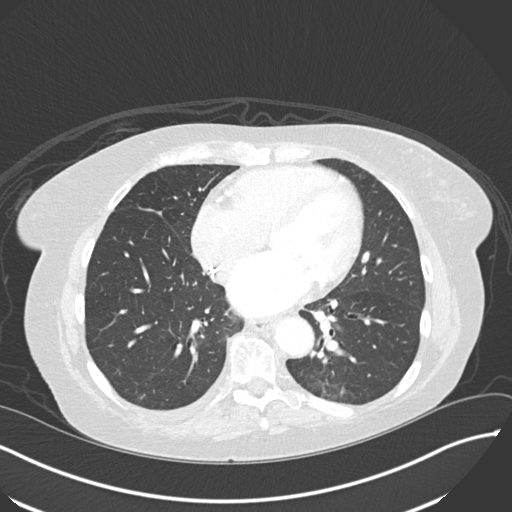
[im 79/152  lung]
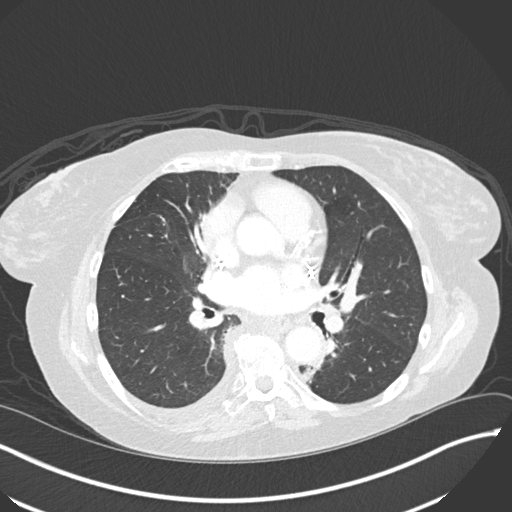
[im 90/152  lung]
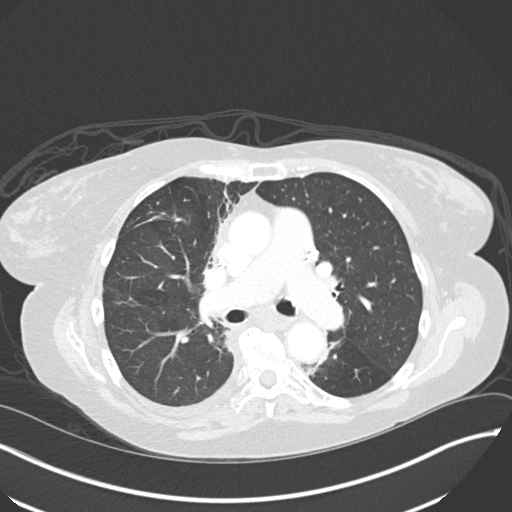
[im 101/152  lung]
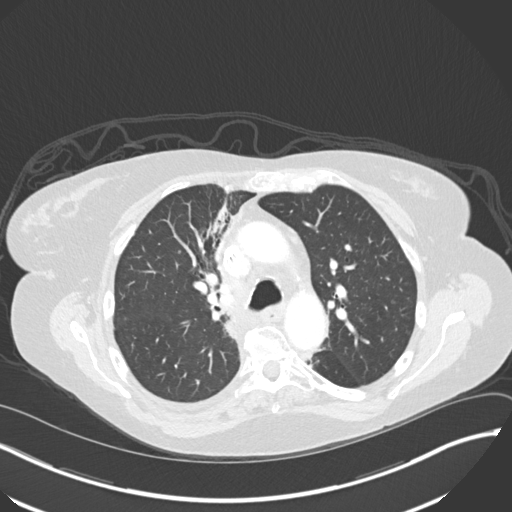
[im 118/152  mediastinal]
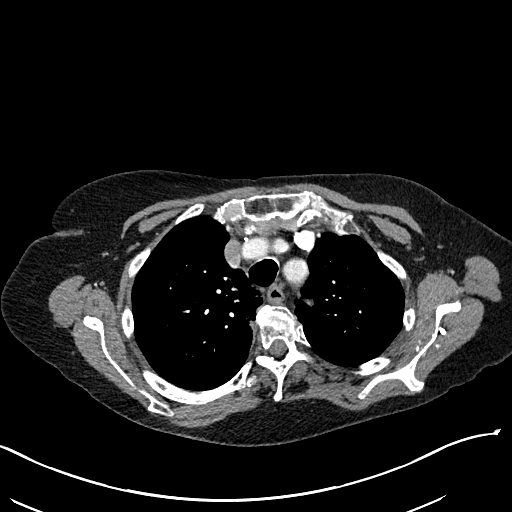
[im 118/152  lung]
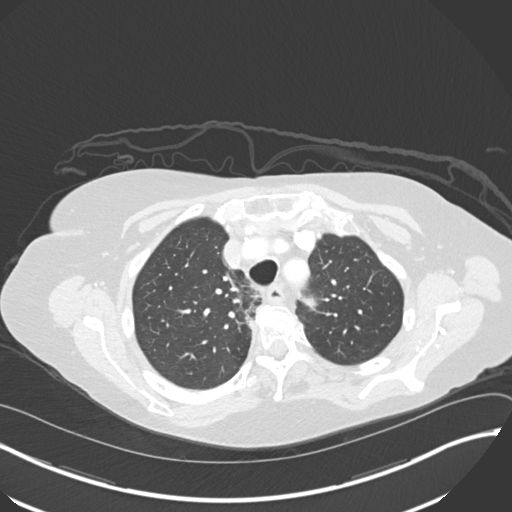
[im 129/152  lung]
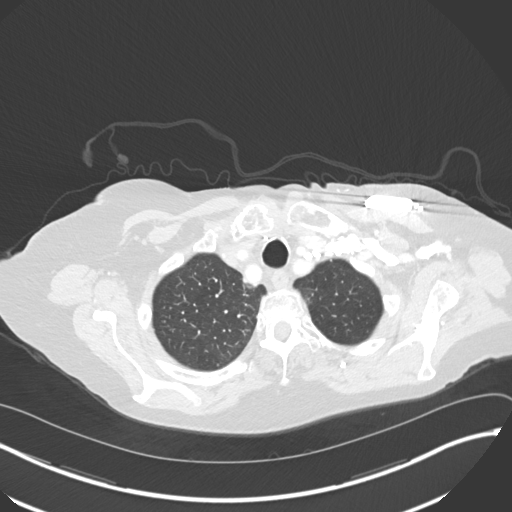
[im 140/152  lung]
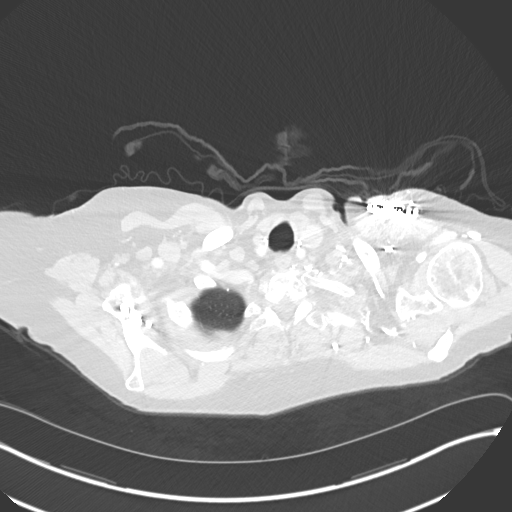

[Series 6: coronal · coronal · 0.64mm/px · 3 of 141 slices shown]
[im 29/141  lung]
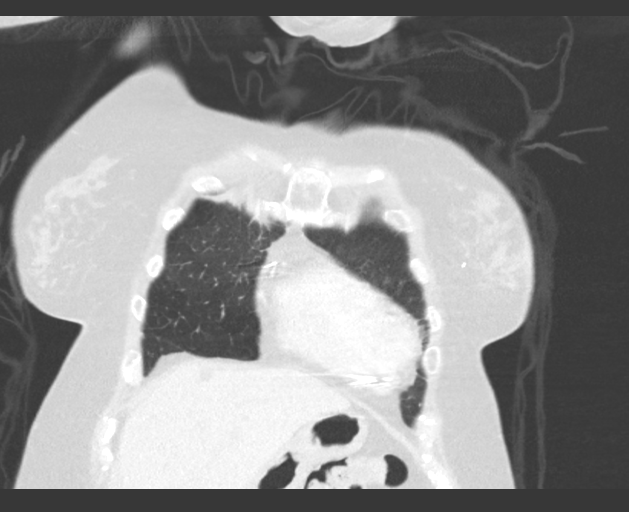
[im 57/141  lung]
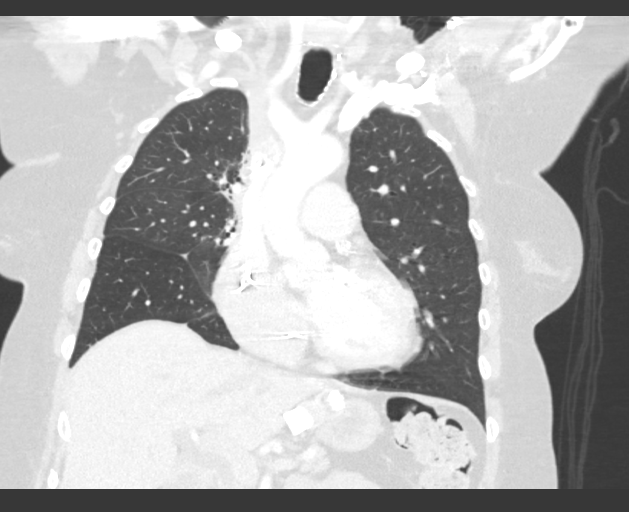
[im 85/141  lung]
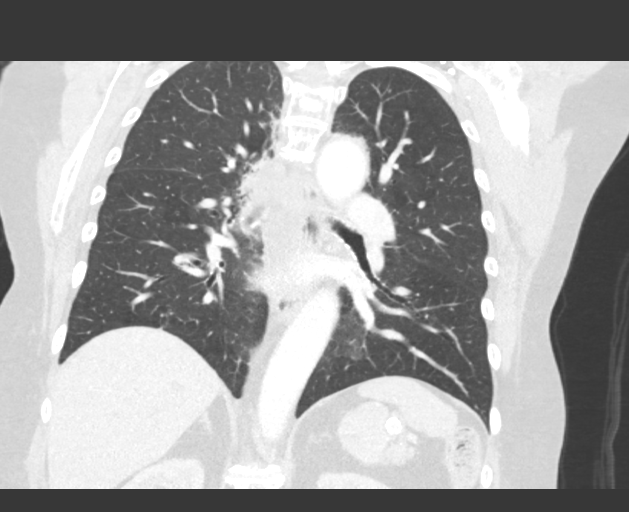

[14 of 36 positions shown; findings below may reference images not displayed]

FINDINGS: Cardiovascular: Left-sided dual lead pacer and signs of left atrial
appendage clipping. Findings are similar to prior study.

Dense mitral annular calcifications with signs of coronary artery
disease also unchanged. Main pulmonary artery at 3 cm unchanged from
previous exam.

Mediastinum/Nodes: Signs of thyroidectomy as before. Lap band in
place with similar appearance. Mild patulous appearance of the
distal esophagus with some distal esophageal thickening is similar
to previous exam. Post treatment changes about the mediastinum and
right hilum with similar appearance.

Lungs/Pleura: Right paramediastinal fibrosis from prior
radiotherapy. Small right-sided effusion is actually diminished from
previous exam.

Tree-in-bud opacities and branching nodularity in the right lower
lobe is similar to the prior study. Nodularity is actually improved
when compared to the study of 06/08/2018.

Signs of left basilar scarring along the medial left lung base are
unchanged.

Stable small single nodule in the left lung base (image 93, series
5) this measures 6 mm, unchanged since at least Wednesday May, 2017

Upper Abdomen: Stable long-standing extrahepatic biliary ductal
distension following cholecystectomy. Signs of lap band at 64
degrees, similar to prior exam slightly greater angle than on more
remote studies.

Musculoskeletal: Signs of right shoulder arthroplasty as before. No
acute bone finding or destructive bone process. Signs of spinal
degenerative change.

Changes of left breast lumpectomy.
IMPRESSION: 1. Stable post treatment changes in the right lung and mediastinum.
2. Tree-in-bud opacities and branching nodularity in the right lower
lobe is similar to the prior study. Nodularity is improved when
compared to the study of 06/08/2018. Likely related to post
infectious or inflammatory sequela, attention on follow-up.
3. Diminished right-sided pleural effusion.
4. Stable 6 mm nodule in the left lung base. Recommend attention on
follow-up.
5. Laparoscopic band may be slightly slipped, correlate with GI
symptoms, mild esophageal thickening could represent mild
esophagitis.
6. Stable long-standing biliary ductal distension following
cholecystectomy.
7. Small splenic artery aneurysm is unchanged, peripheral
calcification instability noted since studies dating back to 3839.

Aortic Atherosclerosis ([id number]).

## 2021-03-15 MED ORDER — COLLAGENASE 250 UNIT/GM EX OINT
TOPICAL_OINTMENT | Freq: Every day | CUTANEOUS | Status: DC
  Administered 2021-03-15: 1 via TOPICAL
  Filled 2021-03-15: qty 30

## 2021-03-15 MED ORDER — PANTOPRAZOLE SODIUM 40 MG IV SOLR
40.0000 mg | Freq: Every day | INTRAVENOUS | Status: DC
  Administered 2021-03-15 (×2): 40 mg via INTRAVENOUS
  Filled 2021-03-15 (×2): qty 40

## 2021-03-15 MED ORDER — ORAL CARE MOUTH RINSE
15.0000 mL | Freq: Two times a day (BID) | OROMUCOSAL | Status: DC
  Administered 2021-03-15 – 2021-03-19 (×7): 15 mL via OROMUCOSAL

## 2021-03-15 MED ORDER — ACETAMINOPHEN 160 MG/5ML PO SOLN: 650.0000 mg | ORAL | Status: DC | PRN

## 2021-03-15 MED ORDER — LEVOTHYROXINE SODIUM 137 MCG PO TABS
68.5000 ug | ORAL_TABLET | ORAL | Status: DC
Start: 2021-03-15 — End: 2021-03-15
  Filled 2021-03-15: qty 1

## 2021-03-15 MED ORDER — ACETAMINOPHEN 325 MG PO TABS
650.0000 mg | ORAL_TABLET | ORAL | Status: DC | PRN
  Administered 2021-03-16 – 2021-03-19 (×4): 650 mg via ORAL
  Filled 2021-03-15 (×4): qty 2

## 2021-03-15 MED ORDER — STROKE: EARLY STAGES OF RECOVERY BOOK
Freq: Once | Status: AC
  Filled 2021-03-15: qty 1

## 2021-03-15 MED ORDER — LABETALOL HCL 5 MG/ML IV SOLN
10.0000 mg | INTRAVENOUS | Status: DC | PRN
  Administered 2021-03-15 – 2021-03-16 (×2): 10 mg via INTRAVENOUS
  Filled 2021-03-15 (×2): qty 4

## 2021-03-15 MED ORDER — LEVOTHYROXINE SODIUM 25 MCG PO TABS
137.0000 ug | ORAL_TABLET | ORAL | Status: DC
  Administered 2021-03-15 – 2021-03-19 (×4): 137 ug via ORAL
  Filled 2021-03-15 (×3): qty 1

## 2021-03-15 MED ORDER — ACETAMINOPHEN 650 MG RE SUPP: 650.0000 mg | RECTAL | Status: DC | PRN

## 2021-03-15 MED ORDER — AMIODARONE HCL 200 MG PO TABS
200.0000 mg | ORAL_TABLET | Freq: Two times a day (BID) | ORAL | Status: DC
  Administered 2021-03-15 – 2021-03-19 (×10): 200 mg via ORAL
  Filled 2021-03-15 (×10): qty 1

## 2021-03-15 MED ORDER — LEVOTHYROXINE SODIUM 25 MCG PO TABS
68.5000 ug | ORAL_TABLET | ORAL | Status: DC
  Administered 2021-03-17: 68.5 ug via ORAL
  Filled 2021-03-15: qty 1

## 2021-03-15 MED ORDER — SENNOSIDES-DOCUSATE SODIUM 8.6-50 MG PO TABS
1.0000 | ORAL_TABLET | Freq: Two times a day (BID) | ORAL | Status: DC
  Administered 2021-03-15 – 2021-03-19 (×7): 1 via ORAL
  Filled 2021-03-15 (×9): qty 1

## 2021-03-15 NOTE — Telephone Encounter (Signed)
CN spoke with Caroleen Hamman, long time friend and emergency contact of patient around 10:40 AM about patient's ED admission on 03/14/21. According to Mrs. Tamala Julian, home health nurse arrived at patient's home afternoon of 03/14/21 to give care.  Patient did not answer doorbell, but nurse could see through window that patient was lying on the floor.  Home health nurse took off screen and crawled through window to enter room and aid patient.  Home health nurse called 911 and patient was transported to hospital on 03/14/21. Patient has been diagnosed with left intracranial hemorrhage and is in [facility].  CN plans to meet Mrs. Smith at hospital this afternoon to visit patient.

## 2021-03-15 NOTE — Progress Notes (Signed)
   Patient had recent TEE to evaluate heart on 03/09/21.   Please contact echo department if repeat study is needed.  Johny Chess 03/15/2021, 8:26 AM

## 2021-03-15 NOTE — Progress Notes (Signed)
SLP Cancellation Note  Patient Details Name: SAMIYYAH MOFFA MRN: [id number] DOB: 06-Aug-1946   Cancelled treatment:        Pt just fell asleep. Spoke with RN. Will not disturb at this time. Plan for speech-language-cognitive assessment tomorrow.    Houston Siren 03/15/2021, 2:23 PM  Orbie Pyo Colvin Caroli.Ed Risk analyst [phone] Office [phone]

## 2021-03-15 NOTE — Telephone Encounter (Signed)
Caroleen Hamman called CN to set up telephone appointment between patient's sister, [name] who has POA and CN to discuss options for patient for discharge.  Sister does not think patient can be discharged to home alone and desires direction and resources.  CN will talk with sister and social worker this afternoon.

## 2021-03-15 NOTE — Evaluation (Signed)
Physical Therapy Evaluation Patient Details Name: Kayla Harrison MRN: [id number] DOB: 1946/07/18 Today's Date: 03/15/2021   History of Present Illness  Pt is a 75 y.o. female who presented 6/22 with AMS after being found on ground at home. Pt with nontraumatic ICH. CT head shows an acute intracranial hemorrhage that extends into the left lateral ventricle and left foramen of Monro into the third ventricle.  She has very mild dilation of her left lateral ventricle with about 5 mm of left-to-right shift. PMH: anemia, arthritis, breast cancer, brucellosis, chronic bil pleural effusions, endometriosis, hepatitis, HTN, hx of dysphagia, hypothyroidism, lung cancer, nephrolithiasis, non Hodgkin's lymphoma, a-fib, cardiac pacemaker, rotator cuff tear, CVA, SVC syndrome, and thyroid cancer.   Clinical Impression  RN agreeable to session now that pt is about > 24 hrs post non-traumatic ICH. Pt presents with condition above and deficits mentioned below, see PT Problem List. PTA, she was living alone in a ground floor condo, utilizing a rollator for mobility with mod I. At baseline, pt is cognitively intact. Currently, pt is displaying deficits in memory, sequencing, processing speed, and awareness into her deficits and safety. In addition, she displays generalized lower extremity weakness (R weaker than L), gross incoordination, imbalance, decreased activity tolerance, bil lower extremity edema limiting ROM, and noted anxiety with standing mobility. Pt needing modA for bed mobility, mod-maxA for transfers, and maxA to take several side steps to the L with a RW. Pt with decreased R weight shift and stance phase, likely secondary to noted knee instability, and thus poor L foot clearance and step. Pt would benefit from follow-up skilled PT services at a SNF for short-term rehab. Will continue to follow acutely.    Follow Up Recommendations SNF;Supervision/Assistance - 24 hour    Equipment Recommendations  Other  (comment) (defer to next venue of care)    Recommendations for Other Services       Precautions / Restrictions Precautions Precautions: Fall Precaution Comments: L hip wound vac; SBP goal < 140 Restrictions Weight Bearing Restrictions: No      Mobility  Bed Mobility Overal bed mobility: Needs Assistance Bed Mobility: Supine to Sit     Supine to sit: HOB elevated;Mod assist     General bed mobility comments: Cued pt to reach for L bed rail to assist with trunk ascension, modA to manage legs off EOB and scoot to square hips with EOB.    Transfers Overall transfer level: Needs assistance Equipment used: Rolling walker (2 wheeled) Transfers: Sit to/from [facility] Sit to Stand: Mod assist;+2 safety/equipment Stand pivot transfers: Max assist;+2 safety/equipment       General transfer comment: Sit > stand from EOB with pt initiating well but slowly and needing modA to complete and obtain upright posture. Cued pt to extend hips and trunk and keep feet flat on ground as her toes tend to lift off ground. MaxA to direct pt to L to transfer to recliner. Intermittent R knee block with R stance to allow L leg to advance to stand step to L. +2 for safety.  Ambulation/Gait Ambulation/Gait assistance: Max assist;+2 safety/equipment Gait Distance (Feet): 3 Feet Assistive device: Rolling walker (2 wheeled) Gait Pattern/deviations: Decreased step length - left;Decreased stance time - right;Decreased stride length;Shuffle;Decreased weight shift to right;Trunk flexed;Leaning posteriorly Gait velocity: decreased Gait velocity interpretation: <1.31 ft/sec, indicative of household ambulator General Gait Details: Pt with trunk flexed and posterior lean, attempting to sit prematurely 1x. Pt with R knee instability, providing intermittent knee block to facilitate  increased R weight shift and stance to allow L leg to advance. Pt with significant difficulty lifting L foot off ground  to step. MaxA to stand step to L with RW, +2 for safety.  Stairs            Wheelchair Mobility    Modified Rankin (Stroke Patients Only) Modified Rankin (Stroke Patients Only) Pre-Morbid Rankin Score: Slight disability Modified Rankin: Moderately severe disability     Balance Overall balance assessment: Needs assistance Sitting-balance support: No upper extremity supported;Feet supported Sitting balance-Leahy Scale: Fair Sitting balance - Comments: Static sitting EOB with supervision.   Standing balance support: Bilateral upper extremity supported;During functional activity Standing balance-Leahy Scale: Poor Standing balance comment: Reliant on bil UE support and physical assistance.                             Pertinent Vitals/Pain Pain Assessment: Faces Faces Pain Scale: Hurts little more Pain Location: bil legs Pain Descriptors / Indicators: Grimacing Pain Intervention(s): Limited activity within patient's tolerance;Monitored during session;Repositioned    Home Living Family/patient expects to be discharged to:: Private residence Living Arrangements: Alone Available Help at Discharge: Family;Available PRN/intermittently Type of Home: Apartment (ground floor condo) Home Access: Level entry     Home Layout: One level Home Equipment: Walker - 4 wheels;Shower seat - built in;Cane - single point Additional Comments: Pt has cat named "Callie".    Prior Function Level of Independence: Independent with assistive device(s)         Comments: mod I with rollator     Hand Dominance   Dominant Hand: Right    Extremity/Trunk Assessment   Upper Extremity Assessment Upper Extremity Assessment: Defer to OT evaluation    Lower Extremity Assessment Lower Extremity Assessment: RLE deficits/detail;LLE deficits/detail (bil edema limiting ROM) RLE Deficits / Details: MMT scores of the following: hip flexion 2+, knee extension 4+, knee flexion 4-, ankle  dorsiflexion 4+ RLE Sensation: decreased light touch (at feet, reports present to incident resulting in admission) RLE Coordination: decreased gross motor LLE Deficits / Details: MMT scores of the following: hip flexion 3+, knee extension 4+, knee flexion 4, ankle dorsiflexion 4 LLE Sensation: decreased light touch (at feet, reports present to incident resulting in admission) LLE Coordination: decreased gross motor       Communication   Communication: No difficulties  Cognition Arousal/Alertness: Awake/alert Behavior During Therapy: Flat affect Overall Cognitive Status: Impaired/Different from baseline Area of Impairment: Orientation;Attention;Following commands;Safety/judgement;Awareness;Problem solving                 Orientation Level: Disoriented to;Situation Current Attention Level: Focused Memory: Decreased short-term memory Following Commands: Follows one step commands consistently;Follows one step commands with increased time;Follows multi-step commands inconsistently Safety/Judgement: Decreased awareness of safety;Decreased awareness of deficits Awareness: Emergent Problem Solving: Slow processing;Difficulty sequencing;Requires verbal cues;Requires tactile cues General Comments: Pt disoriented to situation, knowing she fell at home but not of her ICH. Pt slow to process cues, needing repeated simple multi-modal cues to sequence all tasks. Pt with flat affect and appeared anxious with standing mobility. Memory deficits as she reported she lived with her mother but her friend confirmed she lived alone.      General Comments General comments (skin integrity, edema, etc.): VSS on RA    Exercises     Assessment/Plan    PT Assessment Patient needs continued PT services  PT Problem List Decreased strength;Decreased range of motion;Decreased activity tolerance;Decreased balance;Decreased mobility;Decreased coordination;Decreased cognition;Decreased  knowledge of use of  DME;Decreased safety awareness;Impaired sensation;Obesity;Decreased skin integrity       PT Treatment Interventions DME instruction;Gait training;Functional mobility training;Therapeutic activities;Therapeutic exercise;Balance training;Neuromuscular re-education;Cognitive remediation;Patient/family education    PT Goals (Current goals can be found in the Care Plan section)  Acute Rehab PT Goals Patient Stated Goal: to get better PT Goal Formulation: With patient/family Time For Goal Achievement: 03/29/21 Potential to Achieve Goals: Good    Frequency Min 3X/week   Barriers to discharge Decreased caregiver support      Co-evaluation               AM-PAC PT "6 Clicks" Mobility  Outcome Measure Help needed turning from your back to your side while in a flat bed without using bedrails?: A Lot Help needed moving from lying on your back to sitting on the side of a flat bed without using bedrails?: A Lot Help needed moving to and from a bed to a chair (including a wheelchair)?: A Lot Help needed standing up from a chair using your arms (e.g., wheelchair or bedside chair)?: A Lot Help needed to walk in hospital room?: A Lot Help needed climbing 3-5 steps with a railing? : Total 6 Click Score: 11    End of Session Equipment Utilized During Treatment: Gait belt Activity Tolerance: Patient tolerated treatment well Patient left: in chair;with call bell/phone within reach;with chair alarm set;with nursing/sitter in room;with family/visitor present Nurse Communication: Mobility status PT Visit Diagnosis: Unsteadiness on feet (R26.81);Other abnormalities of gait and mobility (R26.89);Muscle weakness (generalized) (M62.81);Difficulty in walking, not elsewhere classified (R26.2);Other symptoms and signs involving the nervous system (R29.898);History of falling (Z91.81)    Time: 9373-4287 PT Time Calculation (min) (ACUTE ONLY): 28 min   Charges:   PT Evaluation $PT Eval Moderate  Complexity: 1 Mod PT Treatments $Therapeutic Activity: 8-22 mins        Moishe Spice, PT, DPT Acute Rehabilitation Services  Pager: [phone] Office: [phone]   Orvan Falconer 03/15/2021, 7:23 PM

## 2021-03-15 NOTE — Progress Notes (Addendum)
STROKE TEAM PROGRESS NOTE   INTERVAL HISTORY Her RN is at the bedside.  She is difficult to keep awake, but she will briefly attend and follow simple command with much prompting. Updated with pts sister who requests "NO INTUBATION", code status changed to "Partial Code" for now till further [place] can be discussed with family.  Blood pressure adequately controlled.  Vital signs stable. Vitals:   03/15/21 0500 03/15/21 0600 03/15/21 0700 03/15/21 0800  BP: 120/78 128/70 122/70   Pulse: 70 69 69   Resp: (!) 23 20 (!) 21   Temp:    99 F (37.2 C)  TempSrc:      SpO2: 97% 100% 99%   Weight:      Height:       CBC:  Recent Labs  Lab 03/14/21 1743  WBC 12.2*  NEUTROABS 9.9*  HGB 10.6*  HCT 34.5*  MCV 89.6  PLT 932   Basic Metabolic Panel:  Recent Labs  Lab 03/14/21 1743  NA 138  K 3.6  CL 100  CO2 29  GLUCOSE 111*  BUN 18  CREATININE 0.98  CALCIUM 9.0   Lipid Panel: No results for input(s): CHOL, TRIG, HDL, CHOLHDL, VLDL, LDLCALC in the last 168 hours. HgbA1c: No results for input(s): HGBA1C in the last 168 hours. Urine Drug Screen:  Recent Labs  Lab 03/14/21 1738  LABOPIA NONE DETECTED  COCAINSCRNUR NONE DETECTED  LABBENZ NONE DETECTED  AMPHETMU NONE DETECTED  THCU NONE DETECTED  LABBARB NONE DETECTED    Alcohol Level  Recent Labs  Lab 03/14/21 1743  ETH <10    IMAGING past 24 hours CT Angio Head W or Wo Contrast  Result Date: 03/14/2021 CLINICAL DATA:  Neuro deficit, acute stroke suspected. Hemorrhage seen on same day CT head. EXAM: CT ANGIOGRAPHY HEAD AND NECK TECHNIQUE: Multidetector CT imaging of the head and neck was performed using the standard protocol during bolus administration of intravenous contrast. Multiplanar CT image reconstructions and MIPs were obtained to evaluate the vascular anatomy. Carotid stenosis measurements (when applicable) are obtained utilizing NASCET criteria, using the distal internal carotid diameter as the denominator. CONTRAST:   46mL OMNIPAQUE IOHEXOL 350 MG/ML SOLN COMPARISON:  None. FINDINGS: CTA NECK FINDINGS Aortic arch: Great vessel origins are patent. Right carotid system: No evidence of dissection, stenosis (50% or greater) or occlusion. Mild calcific atherosclerosis at the carotid bifurcation. Left carotid system: No evidence of dissection, stenosis (50% or greater) or occlusion. Mild calcific atherosclerosis at the carotid bifurcation. Vertebral arteries: Codominant. No evidence of dissection, stenosis (50% or greater) or occlusion. Skeleton: Severe degenerative disc disease at C5-C6 and C6-C7 with disc height loss and endplate spurring. Heterogeneous bone marrow and osteopenia. Other neck: No acute abnormality. Upper chest: Further evaluated on same day CT chest. Debris in the esophagus. Review of the MIP images confirms the above findings CTA HEAD FINDINGS Anterior circulation: Bilateral ICAs, MCAs, and ACAs are patent without proximal hemodynamically significant stenosis. Calcific atherosclerosis of bilateral intracranial ICAs without greater than 50% narrowing. No aneurysm. No visible vascular malformation in the region of the patient's known hemorrhage, although acute blood products limit evaluation. Posterior circulation: Patent intradural vertebral arteries and basilar artery. Mild calcific atherosclerotic narrowing of the basilar artery. Bilateral posterior cerebral arteries are patent. Mild-to-moderate left P1 and P2 PCA stenosis. Venous sinuses: As permitted by contrast timing, patent. Review of the MIP images confirms the above findings IMPRESSION: CTA Head: 1. No large vessel occlusion. Mild-to-moderate left P1 and P2 PCA stenosis. 2.  No aneurysm or visible vascular malformation in the region of the patient's known hemorrhage, although acute blood products limits evaluation. CTA Neck: 1. No significant (greater than 50%) stenosis. 2. Please see same day CT chest for intrathoracic evaluation. Findings discussed with Dr.  Karle Starch via telephone at 8:18 p.m. Electronically Signed   By: Margaretha Sheffield MD   On: 03/14/2021 20:27   CT HEAD WO CONTRAST  Result Date: 03/15/2021 CLINICAL DATA:  Intracranial hemorrhage, follow-up EXAM: CT HEAD WITHOUT CONTRAST TECHNIQUE: Contiguous axial images were obtained from the base of the skull through the vertex without intravenous contrast. COMPARISON:  03/14/2021 FINDINGS: Brain: Area of parenchymal hemorrhage is again identified in the region of the left caudothalamic groove with intraventricular extension. Mild adjacent edema. No substantial change since the prior study. No new hemorrhage. There is unchanged mild trapping of the left lateral ventricle. No new loss of gray-white differentiation. Stable findings of probable advanced chronic microvascular ischemic changes. Chronic infarct of the left caudate head. Vascular: There is atherosclerotic calcification at the skull base. Skull: Calvarium is unremarkable. Sinuses/Orbits: No acute finding. Other: None. IMPRESSION: Similar appearance of left caudothalamic hemorrhage with intraventricular extension and mild adjacent edema. No new hemorrhage. Unchanged mild trapping of the left lateral ventricle. Electronically Signed   By: Macy Mis M.D.   On: 03/15/2021 08:26   CT Head Wo Contrast  Result Date: 03/14/2021 CLINICAL DATA:  75 year old female with altered mental status. EXAM: CT HEAD WITHOUT CONTRAST TECHNIQUE: Contiguous axial images were obtained from the base of the skull through the vertex without intravenous contrast. COMPARISON:  Head CT dated 12/15/2020 FINDINGS: Brain: There is moderate age-related atrophy and chronic microvascular ischemic changes. Acute intracranial hemorrhage extends from the region of the caudothalamic groove into the left lateral ventricle and through the left foramen Monro into the third ventricle. There is mild dilatation of the left lateral ventricle. There is approximately 5 mm left-to-right  midline shift. Vascular: No hyperdense vessel or unexpected calcification. Skull: Normal. Negative for fracture or focal lesion. Sinuses/Orbits: No acute finding. Other: None IMPRESSION: 1. Acute intracranial hemorrhage extending from the region of the left caudothalamic groove into the left lateral ventricle and through the left foramen Monro into the third ventricle. There is mild dilatation of the left lateral ventricle and approximately 5 mm left-to-right midline shift. Findings may be hypertensive in nature or secondary to rupture of A-comm aneurysm. Further evaluation with CT angiography is recommended. 2. Moderate age-related atrophy and chronic microvascular ischemic changes. These results were called by telephone at the time of interpretation on 03/14/2021 at 6:53 pm to Dr. Kathrynn Humble, who verbally acknowledged these results. Electronically Signed   By: Anner Crete M.D.   On: 03/14/2021 18:58   CT Angio Neck W and/or Wo Contrast  Result Date: 03/14/2021 CLINICAL DATA:  Neuro deficit, acute stroke suspected. Hemorrhage seen on same day CT head. EXAM: CT ANGIOGRAPHY HEAD AND NECK TECHNIQUE: Multidetector CT imaging of the head and neck was performed using the standard protocol during bolus administration of intravenous contrast. Multiplanar CT image reconstructions and MIPs were obtained to evaluate the vascular anatomy. Carotid stenosis measurements (when applicable) are obtained utilizing NASCET criteria, using the distal internal carotid diameter as the denominator. CONTRAST:  67mL OMNIPAQUE IOHEXOL 350 MG/ML SOLN COMPARISON:  None. FINDINGS: CTA NECK FINDINGS Aortic arch: Great vessel origins are patent. Right carotid system: No evidence of dissection, stenosis (50% or greater) or occlusion. Mild calcific atherosclerosis at the carotid bifurcation. Left carotid system: No evidence  of dissection, stenosis (50% or greater) or occlusion. Mild calcific atherosclerosis at the carotid bifurcation.  Vertebral arteries: Codominant. No evidence of dissection, stenosis (50% or greater) or occlusion. Skeleton: Severe degenerative disc disease at C5-C6 and C6-C7 with disc height loss and endplate spurring. Heterogeneous bone marrow and osteopenia. Other neck: No acute abnormality. Upper chest: Further evaluated on same day CT chest. Debris in the esophagus. Review of the MIP images confirms the above findings CTA HEAD FINDINGS Anterior circulation: Bilateral ICAs, MCAs, and ACAs are patent without proximal hemodynamically significant stenosis. Calcific atherosclerosis of bilateral intracranial ICAs without greater than 50% narrowing. No aneurysm. No visible vascular malformation in the region of the patient's known hemorrhage, although acute blood products limit evaluation. Posterior circulation: Patent intradural vertebral arteries and basilar artery. Mild calcific atherosclerotic narrowing of the basilar artery. Bilateral posterior cerebral arteries are patent. Mild-to-moderate left P1 and P2 PCA stenosis. Venous sinuses: As permitted by contrast timing, patent. Review of the MIP images confirms the above findings IMPRESSION: CTA Head: 1. No large vessel occlusion. Mild-to-moderate left P1 and P2 PCA stenosis. 2. No aneurysm or visible vascular malformation in the region of the patient's known hemorrhage, although acute blood products limits evaluation. CTA Neck: 1. No significant (greater than 50%) stenosis. 2. Please see same day CT chest for intrathoracic evaluation. Findings discussed with Dr. Karle Starch via telephone at 8:18 p.m. Electronically Signed   By: Margaretha Sheffield MD   On: 03/14/2021 20:27   CT CHEST W CONTRAST  Result Date: 03/14/2021 CLINICAL DATA:  Slipped and fell. Patient is alert and oriented x4. Cough for unknown period of time. EXAM: CT CHEST WITH CONTRAST TECHNIQUE: Multidetector CT imaging of the chest was performed during intravenous contrast administration. CONTRAST:  13mL OMNIPAQUE  IOHEXOL 350 MG/ML SOLN COMPARISON:  None. FINDINGS: Cardiovascular: No significant vascular findings. Normal heart size. No pericardial effusion. Thoracic aortic atherosclerosis. Multi vessel coronary artery atherosclerosis. Two lead cardiac pacemaker. Mediastinum/Nodes: No enlarged mediastinal, hilar, or axillary lymph nodes. Thyroid gland, trachea, and esophagus demonstrate no significant findings. Lungs/Pleura: Small right pleural effusion. Trace left pleural effusion. No focal consolidation or pneumothorax. Upper Abdomen: No acute upper abdominal abnormality. 10 mm cyst in the left hepatic lobe. Musculoskeletal: No acute osseous abnormality. No aggressive osseous lesion. Generalized osteopenia. Anterior bridging osteophytes of the lower thoracic spine. Right shoulder arthroplasty. IMPRESSION: 1. Small right and trace left pleural effusion. 2.  Aortic Atherosclerosis (ICD10-I70.0). Electronically Signed   By: Kathreen Devoid   On: 03/14/2021 19:57    PHYSICAL EXAM General: Appears well-developed, ill appearing, elderly Caucasian lady mild distress Psych: Affect appropriate to situation Eyes: No scleral injection HENT: No OP obstrucion Head: Normocephalic.  Cardiovascular: Irregular; severe bilat LE lymphedema Respiratory: Effort normal and breath sounds normal to anterior ascultation GI: Soft.  No distension. There is no tenderness.  Skin: Wound vac in place with dressing in lateral left thigh. Medial left thigh has wound as well.   Neurological Examination Mental Status: Lethargic, but will briefly wake to voice, Difficult to keep awake during exam. Briefly followed simple commands, speech is sparse, only able to make out name and knows this is a hospital. No further questions answered.  Cranial Nerves: II: limited VF testing, did seem to blink in all quadrants.  III,IV, VI: EOMI, difficult to test given lack of participation, but finally able to briefly track fully. no ptosis or diplopia  noted V: Facial sensation seems symmetric  VII: Facial movement with very mild flattening of the right  nasolabial fold VIII: hearing is intact to voice X: Uvula is midline and palate elevates symmetrically XI: Shoulder shrug is symmetric. XII: tongue is midline Motor: Tone is normal. Bulk is normal. Diffusely weak throughout and unable to complete exam d/t lethargy. She has difficulty lifting either leg against gravity, and rapidly drifts back to the bed. Sensory: Sensation seems intact to LT in arms and legs. Cerebellar: unable Gait: unable  ASSESSMENT/PLAN Kayla Harrison is a 75 y.o. female with history of atrial fibrillation on anticoagulation with Eliquis who presents with subcortical hemorrhage. This is likely hypertensive in nature, though another possibility would be ischemic stroke with hemorrhagic conversion. She was reversed w/factor 4 concentrate.  Left caudothalamic ICH with IVH:  secondary to HTN and anticoagulation use CTA head & neck no underlying etiology for ICH; mild edema MRI  pending if pacemaker is compatable 2D Echo no need to repeat, just done 6/17 LDL 85 HgbA1c 5.5 VTE prophylaxis - SCDs only d/t bleed    Diet   Diet Heart Room service appropriate? Yes; Fluid consistency: Thin   Eliquis (apixaban) daily prior to admission, now on none d/t bleed Therapy recommendations:  pending Disposition:  pending  Hypertension Home meds:  Amiodarone Stable SBP goal <140 Long-term BP goal normotensive  Hyperlipidemia Home meds:  Crestor 10mg , resumed in hospital LDL 85, goal < 70 Continue statin at discharge  Diabetes type II- no hx HgbA1c 5.5, goal < 7.0 CBGs No results for input(s): GLUCAP in the last 72 hours.  SSI  Other Stroke Risk Factors Advanced Age >/= 55  Obesity, Body mass index is 32.1 kg/m., BMI >/= 30 associated with increased stroke risk, recommend weight loss, diet and exercise as appropriate  Hx stroke/TIA Family hx  stroke Congestive heart failure  Other Active Problems Anemia Severe Lymphadema Wounds- with wound vac/wound care consulted H/o malignancy  Hospital day # 1  Desiree Metzger-Cihelka, ARNP-C, ANVP-BC Pager: [phone]   Stroke Attending Note :  I have personally obtained history,examined this patient, reviewed notes, independently viewed imaging studies, participated in medical decision making and plan of care.ROS completed by me personally and pertinent positives fully documented  I have made any additions or clarifications directly to the above note. Agree with note above.  Patient presented with altered mental status due to left Subcortical hemorrhage likely from anticoagulation with Eliquis as well as hypertension.  This was reversed with 4 factor concentrate and follow-up CT scan shows stable appearance of the left basal ganglia hemorrhage with mild intraventricular extension and cytotoxic edema but no significant hydrocephalus.  Recommend continue close neurological follow-up and strict blood pressure control.  Check MRI scan later.  No family available at the bedside for discussion. This patient is critically ill and at significant risk of neurological worsening, death and care requires constant monitoring of vital signs, hemodynamics,respiratory and cardiac monitoring, extensive review of multiple databases, frequent neurological assessment, discussion with family, other specialists and medical decision making of high complexity.I have made any additions or clarifications directly to the above note.This critical care time does not reflect procedure time, or teaching time or supervisory time of PA/NP/Med Resident etc but could involve care discussion time.  I spent 35 minutes of neurocritical care time  in the care of  this patient.      Antony Contras, MD Medical Director [facility] Pager: [phone] 03/16/2021 2:50 PM  To contact Stroke Continuity provider, please  refer to [email or web address] After hours, contact General Neurology

## 2021-03-15 NOTE — Progress Notes (Signed)
PT Cancellation Note  Patient Details Name: Kayla Harrison MRN: [id number] DOB: October 30, 1945   Cancelled Treatment:    Reason Eval/Treat Not Completed: Medical issues which prohibited therapy. New protocol for non-traumatic ICH is to hold therapy until > 24 hrs, thus will hold until after ~16:00 on 6/23. Will follow-up as time permits.   Moishe Spice, PT, DPT Acute Rehabilitation Services  Pager: [phone] Office: [place] 03/15/2021, 7:46 AM

## 2021-03-15 NOTE — Consult Note (Signed)
[place] Nurse Consult Note: Patient receiving care in [facility] [id number] This patient is familiar to the [facility] team as she was seen prior admission for FU from Dr. Lucia Gaskins surgical wound on the outer thigh, left side.  Reason for Consult: left upper/outer thigh wound vac and wound on inner left thigh Wound type: Left upper/outer thigh surgical wound from hematoma evacuation performed by Dr. Lucia Gaskins in May.  Full thickness wound on the left upper/inner thigh.  Pressure Injury POA: NA Measurement: 1 cm x 1 cm x 2 cm with sutures still in place on both sides of the wound.  Left inner/upper thigh wound measures approx. 15 cm x 1.5 cm x 0.2 cm Wound bed: Left outer upper thigh wound is narrow and 2 cm deep. Left upper/inner thigh wound is 100% yellow.  Drainage: sanguinous drainage from the left upper/outer wound on the old dressing. Brown/tan drainage on foam dressing of inner thigh wound.  Dressing procedure/placement/frequency: Apply Santyl to the left upper/inner thigh wound in a nickel thick layer. Cover with a saline moistened gauze, then dry gauze and secure with a foam dressing.  Change daily.   Left upper/outer thigh: old dressing removed Placed one small piece of black foam into the wound. Protected surrounding skin with drape then covered the foam with drape. Immediate suction obtained at 125 mmHg. This will be a MWF dressing change.   Monitor the wound area(s) for worsening of condition such as: Signs/symptoms of infection, increase in size, development of or worsening of odor, development of pain, or increased pain at the affected locations.   Notify the medical team if any of these develop.  Thank you for the consult. [facility] nurse will follow-up on Monday to check this wound and then most likely turn these dressing changes over to the bedside RN.  Please re-consult the [facility] team if needed.  Cathlean Marseilles Tamala Julian, MSN, RN, [place], [place] Pearl, [facility] Wound Treatment Associate Pager [phone]

## 2021-03-15 NOTE — Progress Notes (Signed)
Subjective: Patient reports  no significant headache but somnolent  Objective: Vital signs in last 24 hours: Temp:  [98.2 F (36.8 C)-98.8 F (37.1 C)] 98.2 F (36.8 C) (06/23 0400) Pulse Rate:  [67-120] 69 (06/23 0700) Resp:  [11-32] 21 (06/23 0700) BP: (113-173)/(67-147) 122/70 (06/23 0700) SpO2:  [95 %-100 %] 99 % (06/23 0700) Weight:  [90.2 kg] 90.2 kg (06/23 0000)  Intake/Output from previous day: 06/22 0701 - 06/23 0700 In: 24.2 [I.V.:24.2] Out: 600 [Urine:600] Intake/Output this shift: No intake/output data recorded.  Patient somnolent but arouses oriented x1 moves all extremities well  Lab Results: Recent Labs    03/14/21 1743  WBC 12.2*  HGB 10.6*  HCT 34.5*  PLT 254   BMET Recent Labs    03/14/21 1743  NA 138  K 3.6  CL 100  CO2 29  GLUCOSE 111*  BUN 18  CREATININE 0.98  CALCIUM 9.0    Studies/Results: CT Angio Head W or Wo Contrast  Result Date: 03/14/2021 CLINICAL DATA:  Neuro deficit, acute stroke suspected. Hemorrhage seen on same day CT head. EXAM: CT ANGIOGRAPHY HEAD AND NECK TECHNIQUE: Multidetector CT imaging of the head and neck was performed using the standard protocol during bolus administration of intravenous contrast. Multiplanar CT image reconstructions and MIPs were obtained to evaluate the vascular anatomy. Carotid stenosis measurements (when applicable) are obtained utilizing NASCET criteria, using the distal internal carotid diameter as the denominator. CONTRAST:  89mL OMNIPAQUE IOHEXOL 350 MG/ML SOLN COMPARISON:  None. FINDINGS: CTA NECK FINDINGS Aortic arch: Great vessel origins are patent. Right carotid system: No evidence of dissection, stenosis (50% or greater) or occlusion. Mild calcific atherosclerosis at the carotid bifurcation. Left carotid system: No evidence of dissection, stenosis (50% or greater) or occlusion. Mild calcific atherosclerosis at the carotid bifurcation. Vertebral arteries: Codominant. No evidence of dissection,  stenosis (50% or greater) or occlusion. Skeleton: Severe degenerative disc disease at C5-C6 and C6-C7 with disc height loss and endplate spurring. Heterogeneous bone marrow and osteopenia. Other neck: No acute abnormality. Upper chest: Further evaluated on same day CT chest. Debris in the esophagus. Review of the MIP images confirms the above findings CTA HEAD FINDINGS Anterior circulation: Bilateral ICAs, MCAs, and ACAs are patent without proximal hemodynamically significant stenosis. Calcific atherosclerosis of bilateral intracranial ICAs without greater than 50% narrowing. No aneurysm. No visible vascular malformation in the region of the patient's known hemorrhage, although acute blood products limit evaluation. Posterior circulation: Patent intradural vertebral arteries and basilar artery. Mild calcific atherosclerotic narrowing of the basilar artery. Bilateral posterior cerebral arteries are patent. Mild-to-moderate left P1 and P2 PCA stenosis. Venous sinuses: As permitted by contrast timing, patent. Review of the MIP images confirms the above findings IMPRESSION: CTA Head: 1. No large vessel occlusion. Mild-to-moderate left P1 and P2 PCA stenosis. 2. No aneurysm or visible vascular malformation in the region of the patient's known hemorrhage, although acute blood products limits evaluation. CTA Neck: 1. No significant (greater than 50%) stenosis. 2. Please see same day CT chest for intrathoracic evaluation. Findings discussed with Dr. Karle Starch via telephone at 8:18 p.m. Electronically Signed   By: Margaretha Sheffield MD   On: 03/14/2021 20:27   CT Head Wo Contrast  Result Date: 03/14/2021 CLINICAL DATA:  75 year old female with altered mental status. EXAM: CT HEAD WITHOUT CONTRAST TECHNIQUE: Contiguous axial images were obtained from the base of the skull through the vertex without intravenous contrast. COMPARISON:  Head CT dated 12/15/2020 FINDINGS: Brain: There is moderate age-related atrophy and  chronic  microvascular ischemic changes. Acute intracranial hemorrhage extends from the region of the caudothalamic groove into the left lateral ventricle and through the left foramen Monro into the third ventricle. There is mild dilatation of the left lateral ventricle. There is approximately 5 mm left-to-right midline shift. Vascular: No hyperdense vessel or unexpected calcification. Skull: Normal. Negative for fracture or focal lesion. Sinuses/Orbits: No acute finding. Other: None IMPRESSION: 1. Acute intracranial hemorrhage extending from the region of the left caudothalamic groove into the left lateral ventricle and through the left foramen Monro into the third ventricle. There is mild dilatation of the left lateral ventricle and approximately 5 mm left-to-right midline shift. Findings may be hypertensive in nature or secondary to rupture of A-comm aneurysm. Further evaluation with CT angiography is recommended. 2. Moderate age-related atrophy and chronic microvascular ischemic changes. These results were called by telephone at the time of interpretation on 03/14/2021 at 6:53 pm to Dr. Kathrynn Humble, who verbally acknowledged these results. Electronically Signed   By: Anner Crete M.D.   On: 03/14/2021 18:58   CT Angio Neck W and/or Wo Contrast  Result Date: 03/14/2021 CLINICAL DATA:  Neuro deficit, acute stroke suspected. Hemorrhage seen on same day CT head. EXAM: CT ANGIOGRAPHY HEAD AND NECK TECHNIQUE: Multidetector CT imaging of the head and neck was performed using the standard protocol during bolus administration of intravenous contrast. Multiplanar CT image reconstructions and MIPs were obtained to evaluate the vascular anatomy. Carotid stenosis measurements (when applicable) are obtained utilizing NASCET criteria, using the distal internal carotid diameter as the denominator. CONTRAST:  52mL OMNIPAQUE IOHEXOL 350 MG/ML SOLN COMPARISON:  None. FINDINGS: CTA NECK FINDINGS Aortic arch: Great vessel origins are  patent. Right carotid system: No evidence of dissection, stenosis (50% or greater) or occlusion. Mild calcific atherosclerosis at the carotid bifurcation. Left carotid system: No evidence of dissection, stenosis (50% or greater) or occlusion. Mild calcific atherosclerosis at the carotid bifurcation. Vertebral arteries: Codominant. No evidence of dissection, stenosis (50% or greater) or occlusion. Skeleton: Severe degenerative disc disease at C5-C6 and C6-C7 with disc height loss and endplate spurring. Heterogeneous bone marrow and osteopenia. Other neck: No acute abnormality. Upper chest: Further evaluated on same day CT chest. Debris in the esophagus. Review of the MIP images confirms the above findings CTA HEAD FINDINGS Anterior circulation: Bilateral ICAs, MCAs, and ACAs are patent without proximal hemodynamically significant stenosis. Calcific atherosclerosis of bilateral intracranial ICAs without greater than 50% narrowing. No aneurysm. No visible vascular malformation in the region of the patient's known hemorrhage, although acute blood products limit evaluation. Posterior circulation: Patent intradural vertebral arteries and basilar artery. Mild calcific atherosclerotic narrowing of the basilar artery. Bilateral posterior cerebral arteries are patent. Mild-to-moderate left P1 and P2 PCA stenosis. Venous sinuses: As permitted by contrast timing, patent. Review of the MIP images confirms the above findings IMPRESSION: CTA Head: 1. No large vessel occlusion. Mild-to-moderate left P1 and P2 PCA stenosis. 2. No aneurysm or visible vascular malformation in the region of the patient's known hemorrhage, although acute blood products limits evaluation. CTA Neck: 1. No significant (greater than 50%) stenosis. 2. Please see same day CT chest for intrathoracic evaluation. Findings discussed with Dr. Karle Starch via telephone at 8:18 p.m. Electronically Signed   By: Margaretha Sheffield MD   On: 03/14/2021 20:27   CT CHEST W  CONTRAST  Result Date: 03/14/2021 CLINICAL DATA:  Slipped and fell. Patient is alert and oriented x4. Cough for unknown period of time. EXAM: CT CHEST WITH CONTRAST  TECHNIQUE: Multidetector CT imaging of the chest was performed during intravenous contrast administration. CONTRAST:  10mL OMNIPAQUE IOHEXOL 350 MG/ML SOLN COMPARISON:  None. FINDINGS: Cardiovascular: No significant vascular findings. Normal heart size. No pericardial effusion. Thoracic aortic atherosclerosis. Multi vessel coronary artery atherosclerosis. Two lead cardiac pacemaker. Mediastinum/Nodes: No enlarged mediastinal, hilar, or axillary lymph nodes. Thyroid gland, trachea, and esophagus demonstrate no significant findings. Lungs/Pleura: Small right pleural effusion. Trace left pleural effusion. No focal consolidation or pneumothorax. Upper Abdomen: No acute upper abdominal abnormality. 10 mm cyst in the left hepatic lobe. Musculoskeletal: No acute osseous abnormality. No aggressive osseous lesion. Generalized osteopenia. Anterior bridging osteophytes of the lower thoracic spine. Right shoulder arthroplasty. IMPRESSION: 1. Small right and trace left pleural effusion. 2.  Aortic Atherosclerosis (ICD10-I70.0). Electronically Signed   By: Kathreen Devoid   On: 03/14/2021 19:57    Assessment/Plan: Intraventricular hemorrhage post bleed day 1 stable on follow-up CT scan continue observation hold off on reinitiation of anticoagulants for hopefully up to 7 days with serial CTs showing progressive resolution of hemorrhage.  LOS: 1 day     Elaina Hoops 03/15/2021, 7:32 AM

## 2021-03-15 NOTE — H&P (Addendum)
Neurology H&P  CC: Confusion  History is obtained from: Chart review  HPI: Kayla Harrison is a 75 y.o. female with a history of hyperlipidemia, atrial fibrillation on Eliquis, lymphoma, who presents with confusion.  She was initially found down, she tells me that she slipped off the bed and could not get back up.  She was down for several hours.  She apparently had some problems remembering these events while she was over in the emergency department, but appears to be doing slightly better now.  In the emergency department, she was taken for head CT which demonstrates a subcortical hemorrhage on the left with intraventricular extension.  Neurosurgery was consulted by the ED.  LKW: 6/21 tpa given?: No, ICH IR Thrombectomy? No, ICH    ROS: A complete ROS was performed and is negative except as noted in the HPI.  Past Medical History:  Diagnosis Date   Anemia    Arthritis    osteoarthritis - knees and right shoulder   Blood transfusion without reported diagnosis    Breast cancer ([place])    Dr Margot Chimes, total thyroidectomy- 1999- for cancer   Brucellosis 1964   Chronic bilateral pleural effusions    Colon polyp    Dr Earlean Shawl   Complication of anesthesia    Ketamine produces LSD reaction, bright colored nightmarish experience    Dyslipidemia    Endometriosis    Fibroids    H/O pleural effusion    s/p thoracentesis w 3243ml withdrawn   Hepatitis    [place] ?hepatitis    History of dysphagia    due to radiation therapy   History of hiatal hernia    small noted on PET scan   Hypertension    Hypothyroidism    Lung cancer, lower lobe ([place]) 01/2017   radiation RX completed 03/04/17; will start chemo 6/27, pt unaware of lung cancer   Morbid obesity ([place])    Status post lap band surgery   Nephrolithiasis    Non Hodgkin's lymphoma ([place])    on chemotherapy   Persistent atrial fibrillation ([place])    a. s/p PVI 2008 b. s/p convergent ablation  7672 complicated by bradycardia requiring pacemaker implant   Personal history of radiation therapy    Presence of permanent cardiac pacemaker    Rotator cuff tear    Right   Stroke ([place])    2003- [place] x2   SVC syndrome    with lung mass and non hodgkins lymphoma   Thyroid cancer ([place]) 2000     Family History  Problem Relation Age of Onset   Heart disease Father 49       MI @autopsy    Colon cancer Father        COLON   Heart attack Father    Other Mother        temporal arteritis    Diabetes Sister    Diabetes Brother    Diabetes Paternal Aunt    Diabetes Paternal Grandmother    Esophageal cancer Neg Hx    Inflammatory bowel disease Neg Hx    Liver disease Neg Hx    Pancreatic cancer Neg Hx    Stomach cancer Neg Hx      Social History:  reports that she has never smoked. She has never used smokeless tobacco. She reports that she does not drink alcohol and does not use drugs.   Prior to Admission medications   Medication Sig Start Date End Date Taking?  Authorizing Provider  amiodarone (PACERONE) 200 MG tablet Take 1 tablet (200 mg total) by mouth in the morning and at bedtime. 03/09/21   Hilty, Nadean Corwin, MD  apixaban (ELIQUIS) 5 MG TABS tablet Take 5 mg by mouth 2 (two) times daily.    [provider]  Emollient (OINTMENT BASE EX) Apply 1 application topically daily as needed (changing of dressing on wound). Honey [place] Ointment Wound    [provider]  furosemide (LASIX) 20 MG tablet Take 20 mg by mouth daily as needed for fluid or edema.  07/21/19   [provider]  HYDROcodone-acetaminophen (NORCO/VICODIN) 5-325 MG tablet Take 1 tablet by mouth 2 (two) times daily as needed for severe pain. 02/23/21   Love, Ivan Anchors, PA-C  levothyroxine (SYNTHROID) 137 MCG tablet Take 68.5-137 mcg by mouth See admin instructions. Taking 137 mcg tablet daily except on Saturday taking 1/2 tablet    [provider]  nystatin (MYCOSTATIN/NYSTOP) powder  Apply 1 application topically 3 (three) times daily as needed (rash/irritation (skin folds)). 03/09/21   Hilty, Nadean Corwin, MD  rosuvastatin (CRESTOR) 10 MG tablet Take 10 mg by mouth 2 (two) times a week. Mondays & Thursdays 06/30/18   [provider]  senna (SENOKOT) 8.6 MG TABS tablet Take 2 tablets (17.2 mg total) by mouth at bedtime. For constipation--adjust as needed. Purchase OTC 02/23/21   Love, Ivan Anchors, PA-C     Exam: Current vital signs: BP 122/72   Pulse 71   Temp 98.2 F (36.8 C) (Oral)   Resp 17   Ht 5\' 6"  (1.676 m)   Wt 90.2 kg   SpO2 100%   BMI 32.10 kg/m    Physical Exam  Constitutional: Appears well-developed and well-nourished.  Psych: Affect appropriate to situation Eyes: No scleral injection HENT: No OP obstrucion Head: Normocephalic.  Cardiovascular: Normal rate and regular rhythm. Bilateral LE lymphedema.  Respiratory: Effort normal and breath sounds normal to anterior ascultation GI: Soft.  No distension. There is no tenderness.  Skin: she has a wound in the medial aspect of her left thigh that is healing. Wound vac dressing on wound on the lateral aspect of her left thigh.   Neuro: Mental Status: Patient is awake, alert, she gives the month of September, but then corrects her self stating that she knows that is not right.  She is able to identify a watch, but not watchband, I think she may have very mild word finding difficulty. Cranial Nerves: II: Visual Fields are full.  Right pupil is 4 mm left pupil is three, both are reactive.   III,IV, VI: EOMI without ptosis or diplopia.  V: Facial sensation is symmetric to temperature VII: Facial movement with very mild flattening of the right nasolabial fold VIII: hearing is intact to voice X: Uvula is midline and palate elevates symmetrically XI: Shoulder shrug is symmetric. XII: tongue is midline without atrophy or fasciculations.  Motor: Tone is normal. Bulk is normal. 5/5 strength was present in  both arms to confrontation, she has very mild drift in her right upper extremity.  She has difficulty lifting either leg against gravity, and rapidly drifts back to the bed. Sensory: Sensation is symmetric to light touch and temperature in the arms and legs. Cerebellar: FNF intact bilaterally   I have reviewed labs in epic and the pertinent results are: Results for orders placed or performed during the hospital encounter of 03/14/21 (from the past 24 hour(s))  Urinalysis, Routine w reflex microscopic Urine, Clean  Catch     Status: Abnormal   Collection Time: 03/14/21  5:38 PM  Result Value Ref Range   Color, Urine STRAW (A) YELLOW   APPearance CLEAR CLEAR   Specific Gravity, Urine 1.006 1.005 - 1.030   pH 8.0 5.0 - 8.0   Glucose, UA NEGATIVE NEGATIVE mg/dL   Hgb urine dipstick NEGATIVE NEGATIVE   Bilirubin Urine NEGATIVE NEGATIVE   Ketones, ur NEGATIVE NEGATIVE mg/dL   Protein, ur NEGATIVE NEGATIVE mg/dL   Nitrite NEGATIVE NEGATIVE   Leukocytes,Ua NEGATIVE NEGATIVE  Urine rapid drug screen (hosp performed)     Status: None   Collection Time: 03/14/21  5:38 PM  Result Value Ref Range   Opiates NONE DETECTED NONE DETECTED   Cocaine NONE DETECTED NONE DETECTED   Benzodiazepines NONE DETECTED NONE DETECTED   Amphetamines NONE DETECTED NONE DETECTED   Tetrahydrocannabinol NONE DETECTED NONE DETECTED   Barbiturates NONE DETECTED NONE DETECTED  Comprehensive metabolic panel     Status: Abnormal   Collection Time: 03/14/21  5:43 PM  Result Value Ref Range   Sodium 138 135 - 145 mmol/L   Potassium 3.6 3.5 - 5.1 mmol/L   Chloride 100 98 - 111 mmol/L   CO2 29 22 - 32 mmol/L   Glucose, Bld 111 (H) 70 - 99 mg/dL   BUN 18 8 - 23 mg/dL   Creatinine, Ser 0.98 0.44 - 1.00 mg/dL   Calcium 9.0 8.9 - 10.3 mg/dL   Total Protein 7.0 6.5 - 8.1 g/dL   Albumin 3.7 3.5 - 5.0 g/dL   AST 27 15 - 41 U/L   ALT 23 0 - 44 U/L   Alkaline Phosphatase 97 38 - 126 U/L   Total Bilirubin 1.0 0.3 - 1.2  mg/dL   GFR, Estimated 60 (L) >60 mL/min   Anion gap 9 5 - 15  CBC with Differential     Status: Abnormal   Collection Time: 03/14/21  5:43 PM  Result Value Ref Range   WBC 12.2 (H) 4.0 - 10.5 K/uL   RBC 3.85 (L) 3.87 - 5.11 MIL/uL   Hemoglobin 10.6 (L) 12.0 - 15.0 g/dL   HCT 34.5 (L) 36.0 - 46.0 %   MCV 89.6 80.0 - 100.0 fL   MCH 27.5 26.0 - 34.0 pg   MCHC 30.7 30.0 - 36.0 g/dL   RDW 16.7 (H) 11.5 - 15.5 %   Platelets 254 150 - 400 K/uL   nRBC 0.0 0.0 - 0.2 %   Neutrophils Relative % 80 %   Neutro Abs 9.9 (H) 1.7 - 7.7 K/uL   Lymphocytes Relative 7 %   Lymphs Abs 0.8 0.7 - 4.0 K/uL   Monocytes Relative 11 %   Monocytes Absolute 1.3 (H) 0.1 - 1.0 K/uL   Eosinophils Relative 0 %   Eosinophils Absolute 0.0 0.0 - 0.5 K/uL   Basophils Relative 1 %   Basophils Absolute 0.1 0.0 - 0.1 K/uL   Immature Granulocytes 1 %   Abs Immature Granulocytes 0.06 0.00 - 0.07 K/uL  Ethanol     Status: None   Collection Time: 03/14/21  5:43 PM  Result Value Ref Range   Alcohol, Ethyl (B) <10 <10 mg/dL  Troponin I (High Sensitivity)     Status: None   Collection Time: 03/14/21  5:43 PM  Result Value Ref Range   Troponin I (High Sensitivity) 8 <18 ng/L  CK     Status: None   Collection Time: 03/14/21  5:43 PM  Result  Value Ref Range   Total CK 50 38 - 234 U/L  Troponin I (High Sensitivity)     Status: None   Collection Time: 03/14/21  7:34 PM  Result Value Ref Range   Troponin I (High Sensitivity) 10 <18 ng/L   *Note: Due to a large number of results and/or encounters for the requested time period, some results have not been displayed. A complete set of results can be found in Results Review.     I have reviewed the images obtained: CT head - ICH on the lateral aspect of the left lateral ventricle with some intraventricular extension.   Primary Diagnosis:  Nontraumatic intracerebral hemorrhage in hemisphere, subcortical  Secondary Diagnosis: Essential (primary) hypertension,  Hypertension Emergency (SBP > 180 or DBP > 120 & end organ damage), Paroxysmal atrial fibrillation, and Obesity   Impression: 75 year old female with a history of atrial fibrillation on anticoagulation with Eliquis who presents with subcortical hemorrhage.  This is likely hypertensive in nature, though another possibility would be ischemic stroke with hemorrhagic conversion.  Typically I use Andexxa to reverse anticoagulation with Eliquis, however four factor concentrate is a viable alternative and has already been administered, I would not favor using Andexxa as well.   Plan: ICH - Admit to ICU - no antiplatelets or anticoagulants - blood pressure control with goal systolic 093 - 818 - Frequent neuro checks - If symptoms worsen or there is decreased mental status, repeat stat head CT - PT,OT,ST  Wounds - Wound consult in the AM.   Afib - continue home amiodarone - hold eliquis  Hypothyroidism -continue home synthroid.   This patient is critically ill and at significant risk of neurological worsening, death and care requires constant monitoring of vital signs, hemodynamics,respiratory and cardiac monitoring, neurological assessment, discussion with family, other specialists and medical decision making of high complexity. I spent 60 minutes of neurocritical care time  in the care of  this patient. This was time spent independent of any time provided by nurse practitioner or PA.  Roland Rack, MD [facility] [phone]  If 7pm- 7am, please page neurology on call as listed in [place].

## 2021-03-16 ENCOUNTER — Inpatient Hospital Stay (HOSPITAL_COMMUNITY): Payer: Medicare Other

## 2021-03-16 ENCOUNTER — Telehealth: Payer: Self-pay | Admitting: *Deleted

## 2021-03-16 ENCOUNTER — Encounter: Payer: Self-pay | Admitting: *Deleted

## 2021-03-16 DIAGNOSIS — I6389 Other cerebral infarction: Secondary | ICD-10-CM

## 2021-03-16 LAB — ECHOCARDIOGRAM COMPLETE
AR max vel: 2.24 cm2
AV Area VTI: 2.09 cm2
AV Area mean vel: 2.09 cm2
AV Mean grad: 9 mmHg
AV Peak grad: 15.4 mmHg
Ao pk vel: 1.96 m/s
Area-P 1/2: 5.02 cm2
Calc EF: 50.9 %
Height: 66 in
MV M vel: 5.28 m/s
MV Peak grad: 111.5 mmHg
MV VTI: 2.17 cm2
Radius: 0.3 cm
S' Lateral: 4 cm
Single Plane A2C EF: 61.8 %
Single Plane A4C EF: 46.2 %
Weight: 3181.68 oz

## 2021-03-16 MED ORDER — PANTOPRAZOLE SODIUM 40 MG PO TBEC
40.0000 mg | DELAYED_RELEASE_TABLET | Freq: Every day | ORAL | Status: DC
  Administered 2021-03-16 – 2021-03-19 (×4): 40 mg via ORAL
  Filled 2021-03-16 (×4): qty 1

## 2021-03-16 NOTE — [place] FL2 (Signed)
[place] MEDICAID FL2 LEVEL OF CARE SCREENING TOOL     IDENTIFICATION  Patient Name: Kayla Harrison Birthdate: 01-Dec-1945 Sex: female Admission Date (Current Location): 03/14/2021  [facility] and [place] Number:  Herbalist and Address:  [facility], [place] [address], [place], [place] [address]      Provider Number: [id number]  Attending Physician Name and Address:  Stroke, Md, MD  Relative Name and Phone Number:  [name] [phone]    Current Level of Care: Hospital Recommended Level of Care: [place] Prior Approval Number:    Date Approved/Denied:   PASRR Number:    Discharge Plan: SNF    Current Diagnoses: Patient Active Problem List   Diagnosis Date Noted   Pressure injury of skin 03/15/2021   Intracranial hemorrhage ([place]) 03/14/2021   Lower extremity edema    Polymicrobial bacterial infection    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    Enterococcus faecalis infection    Sepsis due to Escherichia coli ([place])    Debility 02/13/2021   Infection of wound hematoma 02/05/2021   Physical debility 12/22/2020   Tear of left hamstring 12/22/2020   Labral tear of left hip joint 12/22/2020   Acute blood loss anemia    Orthostatic hypotension 12/17/2020   History of CVA (cerebrovascular accident) 12/17/2020   Fall 12/15/2020   Hip hematoma, left, initial encounter 12/15/2020   Nondisplaced fracture of coronoid process of left ulna, initial encounter for closed fracture 12/15/2020   Multiple rib fractures 12/15/2020   COVID-19 virus infection 10/12/2020   Constipation 06/22/2020   Change in bowel habits 06/22/2020   Diverticulosis 06/22/2020   Dark stools 06/22/2020   Hemorrhoids 06/22/2020   Pacemaker 01/23/2020   Junctional rhythm 01/23/2020   Palpitations 01/23/2020   ICH (intracerebral hemorrhage) ([place]) 12/02/2019   A-fib ([place]) 12/02/2019   Anemia 12/02/2019   QT prolongation 12/02/2019   Hypothyroidism  12/02/2019   Gait abnormality 07/10/2018   S/P reverse total shoulder arthroplasty, 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   Persistent atrial fibrillation ([place])    Bilateral ureteral calculi 11/17/2017   Atrial fibrillation with RVR ([place]) 09/15/2017   Atrial flutter ([place]) 07/17/2017   Port catheter in place 04/02/2017   Non-Hodgkin lymphoma, unspecified, intrathoracic lymph nodes ([place]) 02/13/2017   Mediastinal mass 02/06/2017   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[place]) 02/06/2017   Abnormal chest x-ray    Lung mass 02/03/2017   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   OSA (obstructive sleep apnea) 02/03/2015   History of renal calculi 11/16/2014   Renal calculi 11/16/2014   Family history of colon cancer 10/26/2014   Mechanical complication due to cardiac pacemaker pulse generator 11/06/2013   Dyspnea 05/12/2013   Hypothyroidism 04/21/2013   Pleural effusion 04/19/2013   Long term (current) use of anticoagulants 12/20/2010   EPIDERMOID CYST 08/22/2010   Chronic diastolic heart failure ([place]) 08/06/2010   SYNCOPE AND COLLAPSE 07/27/2010   OSTEOARTHRITIS, KNEE, RIGHT 03/15/2010   Class 1 obesity due to excess calories with body mass index (BMI) of 33.0 to 33.9 in adult 12/07/2009   NONSPEC ELEVATION OF LEVELS OF TRANSAMINASE/LDH 09/08/2009   BREAST CANCER, HX OF 07/25/2009   COLONIC POLYPS, HX OF 07/25/2009   TUBULOVILLOUS ADENOMA, COLON 04/29/2008   HYPERGLYCEMIA, FASTING 04/29/2008   HYPERLIPIDEMIA 06/22/2007   CARCINOMA, THYROID GLAND, HX OF 06/22/2007    Orientation RESPIRATION BLADDER Height & Weight     Time, Situation, Self, Place  Normal Incontinent, External catheter Weight: 198 lb 13.7 oz (  90.2 kg) Height:  5\' 6"  (167.6 cm)  BEHAVIORAL SYMPTOMS/MOOD NEUROLOGICAL BOWEL NUTRITION STATUS      Incontinent Diet (Please see dc summary)  AMBULATORY STATUS COMMUNICATION OF NEEDS Skin   Extensive Assist Verbally PU Stage and Appropriate Care (Stage II on thigh; closed incision on thigh and hip;  wound vac on thigh that will need to be ordered)                       Personal Care Assistance Level of Assistance  Bathing, Feeding, Dressing Bathing Assistance: Maximum assistance Feeding assistance: Limited assistance Dressing Assistance: Maximum assistance     Functional Limitations Info             SPECIAL CARE FACTORS FREQUENCY  PT (By licensed PT), OT (By licensed OT), Speech therapy     PT Frequency: 5x/week OT Frequency: 5x/week     Speech Therapy Frequency: 3x/week      Contractures Contractures Info: Not present    Additional Factors Info  Allergies, Code Status, Isolation Precautions Code Status Info: Partial Allergies Info: Ranolazine, Rivaroxaban, Tape, Tikosyn (Dofetilide), Epinephrine, Ketamine     Isolation Precautions Info: ESBL, OMDRO     Current Medications (03/16/2021):  This is the current hospital active medication list Current Facility-Administered Medications  Medication Dose Route Frequency Provider Last Rate Last Admin   acetaminophen (TYLENOL) tablet 650 mg  650 mg Oral Q4H PRN Greta Doom, MD   650 mg at 03/16/21 1219   Or   acetaminophen (TYLENOL) 160 MG/5ML solution 650 mg  650 mg Per Tube Q4H PRN Greta Doom, MD       Or   acetaminophen (TYLENOL) suppository 650 mg  650 mg Rectal Q4H PRN Greta Doom, MD       amiodarone (PACERONE) tablet 200 mg  200 mg Oral BID Greta Doom, MD   200 mg at 03/16/21 4696   Chlorhexidine Gluconate Cloth 2 % PADS 6 each  6 each Topical Q0600 Greta Doom, MD   6 each at 03/16/21 1005   clevidipine (CLEVIPREX) infusion 0.5 mg/mL  0-21 mg/hr Intravenous Continuous Truddie Hidden, MD   Stopped at 03/15/21 0530   collagenase (SANTYL) ointment   Topical Daily Kary Kos, MD   Given at 03/16/21 1005   labetalol (NORMODYNE) injection 10 mg  10 mg Intravenous Q10 min PRN Greta Doom, MD   10 mg at 03/15/21 2246   levothyroxine (SYNTHROID)  tablet 137 mcg  137 mcg Oral Once per day on Sun Mon Tue Wed Thu Fri Kerney Elbe, MD   137 mcg at 03/16/21 0551   [START ON 03/17/2021] levothyroxine (SYNTHROID) tablet 68.5 mcg  68.5 mcg Oral Once per day on Sat Greta Doom, MD       MEDLINE mouth rinse  15 mL Mouth Rinse BID Greta Doom, MD   15 mL at 03/16/21 1006   pantoprazole (PROTONIX) EC tablet 40 mg  40 mg Oral Daily Henri Medal, RPH   40 mg at 03/16/21 1212   senna-docusate (Senokot-S) tablet 1 tablet  1 tablet Oral BID Greta Doom, MD   1 tablet at 03/16/21 2952     Discharge Medications: Please see discharge summary for a list of discharge medications.  Relevant Imaging Results:  Relevant Lab Results:   Additional Information SSN: [id number]. Pfizer COVID-19 Vaccine 07/03/2020 , 10/23/2019 , 10/02/2019, Moderna Covid-19 Booster Vaccine 01/06/2021  Benard Halsted, LCSW

## 2021-03-16 NOTE — Evaluation (Addendum)
Speech Language Pathology Evaluation Patient Details Name: Kayla Harrison MRN: [id number] DOB: [birth year] Today's Date: 03/16/2021 Time: 1027-2536 SLP Time Calculation (min) (ACUTE ONLY): 41 min  Problem List:  Patient Active Problem List   Diagnosis Date Noted   Pressure injury of skin 03/15/2021   Intracranial hemorrhage ([place]) 03/14/2021   Lower extremity edema    Polymicrobial bacterial infection    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    Enterococcus faecalis infection    Sepsis due to Escherichia coli ([place])    Debility 02/13/2021   Infection of wound hematoma 02/05/2021   Physical debility 12/22/2020   Tear of left hamstring 12/22/2020   Labral tear of left hip joint 12/22/2020   Acute blood loss anemia    Orthostatic hypotension 12/17/2020   History of CVA (cerebrovascular accident) 12/17/2020   Fall 12/15/2020   Hip hematoma, left, initial encounter 12/15/2020   Nondisplaced fracture of coronoid process of left ulna, initial encounter for closed fracture 12/15/2020   Multiple rib fractures 12/15/2020   COVID-19 virus infection 10/12/2020   Constipation 06/22/2020   Change in bowel habits 06/22/2020   Diverticulosis 06/22/2020   Dark stools 06/22/2020   Hemorrhoids 06/22/2020   Pacemaker 01/23/2020   Junctional rhythm 01/23/2020   Palpitations 01/23/2020   ICH (intracerebral hemorrhage) ([place]) 12/02/2019   A-fib ([place]) 12/02/2019   Anemia 12/02/2019   QT prolongation 12/02/2019   Hypothyroidism 12/02/2019   Gait abnormality 07/10/2018   S/P reverse total shoulder arthroplasty, 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   Persistent atrial fibrillation ([place])    Bilateral ureteral calculi 11/17/2017   Atrial fibrillation with RVR ([place]) 09/15/2017   Atrial flutter ([place]) 07/17/2017   Port catheter in place 04/02/2017   Non-Hodgkin lymphoma, unspecified, intrathoracic lymph nodes ([place]) 02/13/2017   Mediastinal mass 02/06/2017   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[place]) 02/06/2017   Abnormal  chest x-ray    Lung mass 02/03/2017   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   OSA (obstructive sleep apnea) 02/03/2015   History of renal calculi 11/16/2014   Renal calculi 11/16/2014   Family history of colon cancer 10/26/2014   Mechanical complication due to cardiac pacemaker pulse generator 11/06/2013   Dyspnea 05/12/2013   Hypothyroidism 04/21/2013   Pleural effusion 04/19/2013   Long term (current) use of anticoagulants 12/20/2010   EPIDERMOID CYST 08/22/2010   Chronic diastolic heart failure ([place]) 08/06/2010   SYNCOPE AND COLLAPSE 07/27/2010   OSTEOARTHRITIS, KNEE, RIGHT 03/15/2010   Class 1 obesity due to excess calories with body mass index (BMI) of 33.0 to 33.9 in adult 12/07/2009   NONSPEC ELEVATION OF LEVELS OF TRANSAMINASE/LDH 09/08/2009   BREAST CANCER, HX OF 07/25/2009   COLONIC POLYPS, HX OF 07/25/2009   TUBULOVILLOUS ADENOMA, COLON 04/29/2008   HYPERGLYCEMIA, FASTING 04/29/2008   HYPERLIPIDEMIA 06/22/2007   CARCINOMA, THYROID GLAND, HX OF 06/22/2007   Past Medical History:  Past Medical History:  Diagnosis Date   Anemia    Arthritis    osteoarthritis - knees and right shoulder   Blood transfusion without reported diagnosis    Breast cancer ([place])    Dr Margot Chimes, total thyroidectomy- 1999- for cancer   Brucellosis 1964   Chronic bilateral pleural effusions    Colon polyp    Dr Earlean Shawl   Complication of anesthesia    Ketamine produces LSD reaction, bright colored nightmarish experience    Dyslipidemia    Endometriosis    Fibroids    H/O pleural effusion    s/p thoracentesis w 3219ml withdrawn   Hepatitis  [place] ?hepatitis    History of dysphagia    due to radiation therapy   History of hiatal hernia    small noted on PET scan   Hypertension    Hypothyroidism    Lung cancer, lower lobe ([place]) 01/2017   radiation RX completed 03/04/17; will start chemo 6/27, pt unaware of lung cancer   Morbid obesity ([place])     Status post lap band surgery   Nephrolithiasis    Non Hodgkin's lymphoma ([place])    on chemotherapy   Persistent atrial fibrillation ([place])    a. s/p PVI 2008 b. s/p convergent ablation 9432 complicated by bradycardia requiring pacemaker implant   Personal history of radiation therapy    Presence of permanent cardiac pacemaker    Rotator cuff tear    Right   Stroke ([place])    2003- [place] x2   SVC syndrome    with lung mass and non hodgkins lymphoma   Thyroid cancer ([place]) 2000   Past Surgical History:  Past Surgical History:  Procedure Laterality Date   ABDOMINAL HYSTERECTOMY  1983   afib ablation     a. 2008 PVI b. 2014 convergent ablation   APPENDECTOMY     BONE MARROW BIOPSY  02/21/2017   BREAST LUMPECTOMY Left 2010   [place]     2015- negative   CARDIOVERSION  10/09/2012   Procedure: CARDIOVERSION;  Surgeon: Minus Breeding, MD;  Location: [facility];  Service: Cardiovascular;  Laterality: N/A;   CARDIOVERSION  10/09/2012   Procedure: CARDIOVERSION;  Surgeon: Minus Breeding, MD;  Location: [facility];  Service: Cardiovascular;  Laterality: N/A;  [name] gave the ok to add pt to the add on , but we must check to find out if the can add pt on at 1400 [phone])   CARDIOVERSION N/A 11/20/2012   Procedure: CARDIOVERSION;  Surgeon: Fay Records, MD;  Location: [place];  Service: Cardiovascular;  Laterality: N/A;   CARDIOVERSION N/A 07/18/2017   Procedure: CARDIOVERSION;  Surgeon: Thayer Headings, MD;  Location: [facility];  Service: Cardiovascular;  Laterality: N/A;   CARDIOVERSION N/A 10/03/2017   Procedure: CARDIOVERSION;  Surgeon: Sanda Klein, MD;  Location: [facility];  Service: Cardiovascular;  Laterality: N/A;   CARDIOVERSION N/A 01/07/2018   Procedure: CARDIOVERSION;  Surgeon: Nahser, Wonda Cheng, MD;  Location: [facility] ENDOSCOPY;  Service: Cardiovascular;  Laterality: N/A;   CARDIOVERSION N/A 12/10/2019   Procedure: CARDIOVERSION;  Surgeon: Buford Dresser, MD;  Location: [facility] ENDOSCOPY;  Service: Cardiovascular;  Laterality: N/A;   CARDIOVERSION N/A 03/09/2021   Procedure: CARDIOVERSION;  Surgeon: Pixie Casino, MD;  Location: [facility];  Service: Cardiovascular;  Laterality: N/A;   CHOLECYSTECTOMY     COLONOSCOPY W/ POLYPECTOMY     Dr Earlean Shawl   CYSTOSCOPY N/A 02/06/2015   Procedure: CYSTOSCOPY;  Surgeon: Kathie Rhodes, MD;  Location: [facility];  Service: Urology;  Laterality: N/A;   CYSTOSCOPY W/ RETROGRADES Left 11/17/2017   Procedure: CYSTOSCOPY WITH RETROGRADE /PYELOGRAM/;  Surgeon: Kathie Rhodes, MD;  Location: [facility];  Service: Urology;  Laterality: Left;   CYSTOSCOPY WITH RETROGRADE PYELOGRAM, URETEROSCOPY AND STENT PLACEMENT Right 02/06/2015   Procedure: RETROGRADE PYELOGRAM, RIGHT URETEROSCOPY STENT PLACEMENT;  Surgeon: Kathie Rhodes, MD;  Location: [facility];  Service: Urology;  Laterality: Right;   CYSTOSCOPY WITH RETROGRADE PYELOGRAM, URETEROSCOPY AND STENT PLACEMENT Right 03/07/2017   Procedure: CYSTOSCOPY WITH RIGHT RETROGRADE PYELOGRAM,RIGHT  URETEROSCOPYLASER LITHOTRIPSY  AND STENT  PLACEMENT AND STONE BASKETRY;  Surgeon: Kathie Rhodes, MD;  Location: [facility];  Service: Urology;  Laterality: Right;   EYE SURGERY     cataract surgery   fatty mass removal  1999   pubic area   HOLMIUM LASER APPLICATION N/A 1/51/7616   Procedure: HOLMIUM LASER APPLICATION;  Surgeon: Kathie Rhodes, MD;  [facility]: [facility];  Service: Urology;  Laterality: N/A;   HOLMIUM LASER APPLICATION Right 0/73/7106   Procedure: HOLMIUM LASER APPLICATION;  Surgeon: Kathie Rhodes, MD;  Location: [facility];  Service: Urology;  Laterality: Right;   HOLMIUM LASER APPLICATION Left 2/69/4854   Procedure: HOLMIUM LASER APPLICATION;  Surgeon: Kathie Rhodes, MD;  [facility];  Service: Urology;  Laterality: Left;   I & D EXTREMITY Left 12/19/2020   Procedure: IRRIGATION AND DEBRIDEMENT OF LEFT HIP HEMATOMA WITH APPLICATION OF WOUND  VAC;  Surgeon: Erle Crocker, MD;  Location: [place];  Service: Orthopedics;  Laterality: Left;   I & D EXTREMITY Left 02/06/2021   Procedure: IRRIGATION AND DEBRIDEMENT DEEP ABCESS LEFT THIGH, SECONDARY CLOSURE OF WOUND DEHISCENCE;  Surgeon: Erle Crocker, MD;  Location: [place];  Service: Orthopedics;  Laterality: Left;   IR FLUORO GUIDE PORT INSERTION RIGHT  02/24/2017   IR NEPHROSTOMY PLACEMENT RIGHT  11/17/2017   IR PATIENT EVAL TECH 0-60 MINS  03/11/2017   IR REMOVAL TUN ACCESS W/ PORT W/O FL MOD SED  04/20/2018   IR US GUIDE VASC ACCESS RIGHT  02/24/2017   KNEE ARTHROSCOPY     bilateral   LAPAROSCOPIC GASTRIC BANDING  07/10/2010   LAPAROSCOPIC GASTRIC BANDING     Laparoscopic adjustable banding APS System with posterior hiatal hernia, 2 suture.   LAPAROTOMY     for ruptured ovary and ovarian artery    NEPHROLITHOTOMY Right 11/17/2017   Procedure: NEPHROLITHOTOMY PERCUTANEOUS;  Surgeon: Kathie Rhodes, MD;  [facility];  Service: Urology;  Laterality: Right;   PACEMAKER INSERTION  03/10/2013   MDT dual chamber PPM   POCKET REVISION N/A 12/08/2013   Procedure: POCKET REVISION;  Surgeon: Deboraha Sprang, MD;  Location: [facility] CATH LAB;  Service: Cardiovascular;  Laterality: N/A;   PORTA CATH INSERTION     REVERSE SHOULDER ARTHROPLASTY Right 05/14/2018   Procedure: RIGHT REVERSE SHOULDER ARTHROPLASTY;  Surgeon: Tania Ade, MD;  Location: [place];  Service: Orthopedics;  Laterality: Right;   REVERSE SHOULDER REPLACEMENT Right 05/14/2018   RIGHT HEART CATH N/A 07/21/2019   Procedure: RIGHT HEART CATH;  Surgeon: Larey Dresser, MD;  Location: [place] CV LAB;  Service: Cardiovascular;  Laterality: N/A;   TEE WITH CARDIOVERSION  09/22/2017   TEE WITHOUT CARDIOVERSION N/A 10/03/2017   Procedure: TRANSESOPHAGEAL ECHOCARDIOGRAM (TEE);  Surgeon: Sanda Klein, MD;  Location: [facility] ENDOSCOPY;  Service: Cardiovascular;  Laterality: N/A;   TEE WITHOUT CARDIOVERSION N/A 08/04/2019    Procedure: TRANSESOPHAGEAL ECHOCARDIOGRAM (TEE);  Surgeon: Larey Dresser, MD;  Location: [facility] ENDOSCOPY;  Service: Cardiovascular;  Laterality: N/A;   TEE WITHOUT CARDIOVERSION N/A 12/10/2019   Procedure: TRANSESOPHAGEAL ECHOCARDIOGRAM (TEE);  Surgeon: Buford Dresser, MD;  Location: [facility] ENDOSCOPY;  Service: Cardiovascular;  Laterality: N/A;   TEE WITHOUT CARDIOVERSION N/A 03/09/2021   Procedure: TRANSESOPHAGEAL ECHOCARDIOGRAM (TEE);  Surgeon: Pixie Casino, MD;  Location: [place];  Service: Cardiovascular;  Laterality: N/A;   THYROIDECTOMY  1998   Dr Margot Chimes   TONSILLECTOMY     TOTAL KNEE ARTHROPLASTY  04/13/2012   Procedure: TOTAL KNEE ARTHROPLASTY;  Surgeon: Annie Main  Glenda Chroman, MD;  Location: [place];  Service: Orthopedics;  Laterality: Right;   VIDEO BRONCHOSCOPY WITH ENDOBRONCHIAL ULTRASOUND N/A 02/07/2017   Procedure: VIDEO BRONCHOSCOPY WITH ENDOBRONCHIAL ULTRASOUND;  Surgeon: Marshell Garfinkel, MD;  Location: [place];  Service: Pulmonary;  Laterality: N/A;   HPI:  Pt is a 75 y.o. female who presented 6/22 with AMS after being found on ground at home.  CT head shows an acute intracranial hemorrhage that extends into the left lateral ventricle and left foramen of Monro into the third ventricle. PMH: anemia, arthritis, breast cancer, brucellosis, chronic bil pleural effusions, endometriosis, hepatitis, HTN, hx of dysphagia, hypothyroidism, lung cancer, nephrolithiasis, non Hodgkin's lymphoma, a-fib, cardiac pacemaker, rotator cuff tear, CVA, SVC syndrome, and thyroid cancer.   Assessment / Plan / Recommendation Clinical Impression  Pt familiar to this clinician from 11/2020 when she was assessed and scored a 25/30 (mild cognitive imppairment) and further ST not needed at that time. Today she was assessed with friend [name] at bedside notices a significant decline from her baseline. Adminstration of SLUMS was incomplete due to patient requiring toileting assist however performance has declined  from prior admission. She is oriented x3. Significant memory impairments re: storage and did not remember hearing 5 word presentation. Divergently she named 5 animals in one minute. Comprehension and speech intelligibility are intact from language aspect. Comprehension of increased complexity suspected due to cognition.Pt did demonstrate some level of emergent awareness of difficulty with various tasks. ST will continue to work with pt while in hospiral re: cognition and recommend she have continued therapy at SNF as she is not safe to live alone at this time.    SLP Assessment  SLP Recommendation/Assessment: Patient needs continued Speech Lanaguage Pathology Services SLP Visit Diagnosis: Cognitive communication deficit (R41.841)    Follow Up Recommendations  Skilled Nursing facility    Frequency and Duration min 2x/week  2 weeks      SLP Evaluation Cognition  Overall Cognitive Status: Impaired/Different from baseline Arousal/Alertness: Awake/alert Orientation Level: Oriented to person;Oriented to place;Oriented to time;Disoriented to situation Attention: Selective Selective Attention: Impaired Selective Attention Impairment: Verbal basic;Functional basic Memory: Impaired Memory Impairment: Retrieval deficit;Storage deficit;Decreased recall of new information Awareness: Impaired Awareness Impairment: Anticipatory impairment;Intellectual impairment (some emergent awareness of what she is having trouble with) Problem Solving: Impaired Problem Solving Impairment: Verbal basic;Functional basic Safety/Judgment: Impaired       Comprehension  Auditory Comprehension Overall Auditory Comprehension: Appears within functional limits for tasks assessed Visual Recognition/Discrimination Discrimination: Not tested Reading Comprehension Reading Status: Not tested    Expression Expression Primary Mode of Expression: Verbal Verbal Expression Overall Verbal Expression: Impaired Initiation: No  impairment Level of Generative/Spontaneous Verbalization: Sentence Repetition:  (NT) Naming: Impairment Divergent: 25-49% accurate Pragmatics: Impairment Impairments: Abnormal affect (friend says she is flatter than normal) Written Expression Dominant Hand: Right Written Expression: Not tested   Oral / Motor  Oral Motor/Sensory Function Overall Oral Motor/Sensory Function: Within functional limits Motor Speech Overall Motor Speech: Appears within functional limits for tasks assessed Intelligibility: Intelligible   GO                    Jenifer Struve, Orbie Pyo.lls  03/16/2021, 12:31 PM  Orbie Pyo Sahily Biddle M.Ed Risk analyst [phone] Office [phone]

## 2021-03-16 NOTE — Progress Notes (Signed)
Physical Therapy Treatment Patient Details Name: Kayla Harrison MRN: [id number] DOB: 12-09-1945 Today's Date: 03/16/2021    History of Present Illness Pt is a 75 y.o. female who presented 6/22 with AMS after being found on ground at home. Pt with nontraumatic ICH. CT head shows an acute intracranial hemorrhage that extends into the left lateral ventricle and left foramen of [place] into the third ventricle.  She has very mild dilation of her left lateral ventricle with about 5 mm of left-to-right shift. PMH: anemia, arthritis, breast cancer, brucellosis, chronic bil pleural effusions, endometriosis, hepatitis, HTN, hx of dysphagia, hypothyroidism, lung cancer, nephrolithiasis, non Hodgkin's lymphoma, a-fib, cardiac pacemaker, rotator cuff tear, CVA, SVC syndrome, and thyroid cancer.    PT Comments    Pt demonstrating significant progress with mobility since her session yesterday. Educated pt and her friend on follow-up PT options. Instructed them that she will need 24/7 assistance at home at this time as she continues to be at risk for falls and is demonstrating cognitive deficits. She has a sister that lives in the same complex that may be able to provide 24/7 assistance. As she is making good progress and thus demonstrating potential for further improvements changed d/c recs to CIR. Began session with lower extremity warm-up exercises as she reported bil knee pain. Pt continues to display R hip flexor weakness. Pt was able to perform transfers and ambulate up to ~8 ft with a RW with modAx1 this date. She displayed improved weight shifting and bil step length and feet clearance. Will continue to follow acutely.   Follow Up Recommendations  Supervision/Assistance - 24 hour;CIR     Equipment Recommendations  Other (comment) (defer to next venue of care)    Recommendations for Other Services Rehab consult     Precautions / Restrictions Precautions Precautions: Fall Precaution Comments: L  hip wound vac; SBP goal < 140 Restrictions Weight Bearing Restrictions: No    Mobility  Bed Mobility Overal bed mobility: Needs Assistance Bed Mobility: Supine to Sit     Supine to sit: HOB elevated;Min assist     General bed mobility comments: Cued pt to reach for L bed rail to assist with trunk ascension, minA to control legs descending off EOB as they would hurt with a quick descent.    Transfers Overall transfer level: Needs assistance Equipment used: Rolling walker (2 wheeled) Transfers: Sit to/from [facility] Sit to Stand: Mod assist Stand pivot transfers: Mod assist       General transfer comment: Sit to stand 1x from EOB and 1x from recliner, cuing for hand position and to rock to gain momentum, modA to power up, steady, and obtain upright posture and reduce posterior lean. ModA to stand step to L bed > recliner with RW, cuing for feet placement and RW management.  Ambulation/Gait Ambulation/Gait assistance: Mod assist Gait Distance (Feet): 8 Feet (x2 bouts of ~3 ft > ~8 ft) Assistive device: Rolling walker (2 wheeled) Gait Pattern/deviations: Decreased stride length;Shuffle;Trunk flexed;Leaning posteriorly;Step-through pattern;Decreased stance time - right Gait velocity: decreased Gait velocity interpretation: <1.31 ft/sec, indicative of household ambulator General Gait Details: Pt with trunk flexed and posterior lean, tactile and verbal cues to correct, mod success. Practiced lateral weight shifting and marching prior to stepping anteriorly. Improved weight shifting and leg advancement bil. Noted decreased stride length and bil feet clearance, needing visual cues to improve through cuing pt to step on PT's feet anterior to her, success noted. Mutual for stability and guidance.   Stairs  Wheelchair Mobility    Modified Rankin (Stroke Patients Only) Modified Rankin (Stroke Patients Only) Pre-Morbid Rankin Score: Slight  disability Modified Rankin: Moderately severe disability     Balance Overall balance assessment: Needs assistance Sitting-balance support: No upper extremity supported;Feet supported Sitting balance-Leahy Scale: Fair Sitting balance - Comments: Static sitting EOB with supervision.   Standing balance support: Bilateral upper extremity supported;During functional activity Standing balance-Leahy Scale: Poor Standing balance comment: Reliant on bil UE support and physical assistance.                            Cognition Arousal/Alertness: Awake/alert Behavior During Therapy: Flat affect Overall Cognitive Status: Impaired/Different from baseline Area of Impairment: Attention;Following commands;Safety/judgement;Awareness;Problem solving                   Current Attention Level: Focused Memory: Decreased short-term memory Following Commands: Follows one step commands consistently;Follows one step commands with increased time;Follows multi-step commands inconsistently Safety/Judgement: Decreased awareness of safety;Decreased awareness of deficits Awareness: Emergent Problem Solving: Slow processing;Difficulty sequencing;Requires verbal cues;Requires tactile cues General Comments: Pt slow to process cues, needing repeated simple multi-modal cues to sequence all tasks. Pt with flat affect and appeared anxious with standing mobility. Needs reminders for proper posture and balance.      Exercises General Exercises - Lower Extremity Long Arc Quad: Strengthening;Both;10 reps;Seated Heel Slides: AROM;Both;10 reps;Supine Hip Flexion/Marching: Strengthening;Both;10 reps;Seated    General Comments        Pertinent Vitals/Pain Pain Assessment: Faces Faces Pain Scale: Hurts little more Pain Location: bil knees Pain Descriptors / Indicators: Grimacing;Guarding;Moaning Pain Intervention(s): Limited activity within patient's tolerance;Monitored during session;Repositioned     Home Living                      Prior Function            PT Goals (current goals can now be found in the care plan section) Acute Rehab PT Goals Patient Stated Goal: to go home PT Goal Formulation: With patient/family Time For Goal Achievement: 03/29/21 Potential to Achieve Goals: Good Progress towards PT goals: Progressing toward goals    Frequency    Min 4X/week      PT Plan Discharge plan needs to be updated;Frequency needs to be updated    Co-evaluation              AM-PAC PT "6 Clicks" Mobility   Outcome Measure  Help needed turning from your back to your side while in a flat bed without using bedrails?: A Little Help needed moving from lying on your back to sitting on the side of a flat bed without using bedrails?: A Little Help needed moving to and from a bed to a chair (including a wheelchair)?: A Lot Help needed standing up from a chair using your arms (e.g., wheelchair or bedside chair)?: A Lot Help needed to walk in hospital room?: A Lot Help needed climbing 3-5 steps with a railing? : Total 6 Click Score: 13    End of Session Equipment Utilized During Treatment: Gait belt Activity Tolerance: Patient tolerated treatment well Patient left: in chair;with call bell/phone within reach;with chair alarm set;with family/visitor present Nurse Communication: Mobility status PT Visit Diagnosis: Unsteadiness on feet (R26.81);Other abnormalities of gait and mobility (R26.89);Muscle weakness (generalized) (M62.81);Difficulty in walking, not elsewhere classified (R26.2);Other symptoms and signs involving the nervous system (R29.898);History of falling (Z91.81)     Time: 1740-8144 PT Time Calculation (  min) (ACUTE ONLY): 38 min  Charges:  $Gait Training: 8-22 mins $Therapeutic Exercise: 8-22 mins $Therapeutic Activity: 8-22 mins                     Moishe Spice, PT, DPT Acute Rehabilitation Services  Pager: [phone] Office:  [place] 03/16/2021, 6:29 PM

## 2021-03-16 NOTE — Progress Notes (Signed)
  Echocardiogram 2D Echocardiogram has been performed.  Bobbye Charleston 03/16/2021, 11:20 AM

## 2021-03-16 NOTE — Telephone Encounter (Signed)
Kayla Harrison, friend of patient called to request that CN speak with patient's sister, [name] after CN and Thompson Caul visit patient.

## 2021-03-16 NOTE — Progress Notes (Addendum)
STROKE TEAM PROGRESS NOTE   INTERVAL HISTORY Her RN is at the bedside. She is much more awake today, able to follow commands.  She is in much pain as RN begins to change dressings.  Patient's exam is much improved today.  Blood pressure adequately controlled.  Echocardiogram shows ejection fraction 55 to 60% without significant wall motion abnormalities. Vitals:   03/16/21 1000 03/16/21 1100 03/16/21 1116 03/16/21 1200  BP: 132/65 (!) 145/77 127/81   Pulse: 69 70 69   Resp: 16 18 15    Temp:    99.1 F (37.3 C)  TempSrc:    Oral  SpO2: 99% 100% 100%   Weight:      Height:       CBC:  Recent Labs  Lab 03/14/21 1743  WBC 12.2*  NEUTROABS 9.9*  HGB 10.6*  HCT 34.5*  MCV 89.6  PLT 233    Basic Metabolic Panel:  Recent Labs  Lab 03/14/21 1743  NA 138  K 3.6  CL 100  CO2 29  GLUCOSE 111*  BUN 18  CREATININE 0.98  CALCIUM 9.0    Lipid Panel: No results for input(s): CHOL, TRIG, HDL, CHOLHDL, VLDL, LDLCALC in the last 168 hours. HgbA1c: No results for input(s): HGBA1C in the last 168 hours. Urine Drug Screen:  Recent Labs  Lab 03/14/21 1738  LABOPIA NONE DETECTED  COCAINSCRNUR NONE DETECTED  LABBENZ NONE DETECTED  AMPHETMU NONE DETECTED  THCU NONE DETECTED  LABBARB NONE DETECTED     Alcohol Level  Recent Labs  Lab 03/14/21 1743  ETH <10     IMAGING past 24 hours No results found.  PHYSICAL EXAM General: Appears well-developed, ill appearing, mild distress Psych: Affect appropriate to situation Eyes: No scleral injection HENT: No OP obstrucion Head: Normocephalic.  Cardiovascular: Irregular; severe bilat LE lymphedema Respiratory: Effort normal and breath sounds normal to anterior ascultation GI: Soft.  No distension. There is no tenderness.  Skin: Wound vac in place with dressing in lateral left thigh. Medial left thigh has wound as well.   Neurological Examination Mental Status: Alert, orients to name, dob, knows this is a hospital, but cant  name city or exact name and cannot state why she is here. Says its Sep 2022. Speech is fluent, but slow.  Cranial Nerves: II: vF full III,IV, VI: EOMI. no ptosis or diplopia noted V: Facial sensation seems symmetric  VII: Facial movement with very mild flattening of the right nasolabial fold VIII: hearing is intact to voice X: Uvula is midline and palate elevates symmetrically XI: Shoulder shrug is symmetric. XII: tongue is midline Motor: Tone is normal. Bulk is normal. Diffusely weak throughout lifting either leg against gravity and is in much pain on left d/t wound. She is able to wiggle toes and foot push slightly less on right. Moves both arms equally, both 4/5. Sensory: Sensation seems intact to LT in arms and legs. Cerebellar: unable Gait: unable  ASSESSMENT/PLAN [name] is a 75 y.o. female with history of atrial fibrillation on anticoagulation with Eliquis who presents with subcortical hemorrhage. This is likely hypertensive in nature, though another possibility would be ischemic stroke with hemorrhagic conversion. She was reversed w/factor 4 concentrate.  Left caudothalamic ICH with IVH:  secondary to HTN and anticoagulation use CTA head & neck no underlying etiology for ICH; mild edema MRI unable d/t pacemaker  2D Echo no need to repeat, just done 6/17 LDL 85 HgbA1c 5.5 VTE prophylaxis - SCDs only d/t bleed; Ok  to start lovenox in next 24h    Diet   Diet Heart Room service appropriate? Yes; Fluid consistency: Thin   Eliquis (apixaban) daily prior to admission, now on none d/t bleed Therapy recommendations:  pending Disposition:  pending  Hypertension Home meds:  Amiodarone Stable SBP goal <140 Long-term BP goal normotensive  Hyperlipidemia Home meds:  Crestor 10mg , resumed in hospital LDL 85, goal < 70 Continue statin at discharge  Diabetes type II- no hx HgbA1c 5.5, goal < 7.0 CBGs No results for input(s): GLUCAP in the last 72 hours.   SSI  Other Stroke Risk Factors Advanced Age >/= 8  Obesity, Body mass index is 32.1 kg/m., BMI >/= 30 associated with increased stroke risk, recommend weight loss, diet and exercise as appropriate  Hx stroke/TIA Family hx stroke Congestive heart failure  Other Active Problems Anemia Severe Lymphadema Wounds- with wound vac/wound care consulted H/o malignancy  Hospital day # 2  Desiree Metzger-Cihelka, ARNP-C, ANVP-BC Pager: [phone]  I have personally obtained history,examined this patient, reviewed notes, independently viewed imaging studies, participated in medical decision making and plan of care.ROS completed by me personally and pertinent positives fully documented  I have made any additions or clarifications directly to the above note. Agree with note above.  Patient is doing better today.  Continue mobilization out of bed and therapy consults.  Transfer to neurology floor bed is available.  Consult medical hospitalist team to resume care after leaving ICU.  Discussed with friend at the bedside and answered questions.  I called the patient's health power of attorney her sister Mary's listed phone number and left message for her to call me back as I got no answer.This patient is critically ill and at significant risk of neurological worsening, death and care requires constant monitoring of vital signs, hemodynamics,respiratory and cardiac monitoring, extensive review of multiple databases, frequent neurological assessment, discussion with family, other specialists and medical decision making of high complexity.I have made any additions or clarifications directly to the above note.This critical care time does not reflect procedure time, or teaching time or supervisory time of PA/NP/Med Resident etc but could involve care discussion time.  I spent 30 minutes of neurocritical care time  in the care of  this patient.      Antony Contras, MD Medical Director [facility] Stroke  Center Pager: [phone] 03/16/2021 2:54 PM  To contact Stroke Continuity provider, please refer to [email or web address] After hours, contact General Neurology

## 2021-03-16 NOTE — Congregational Nurse Program (Signed)
On Thursday, 03/15/21, CN met Kayla Harrison, friend of patient in patient's room to visit and assess patient.  Patient was was sitting up in chair, was interacting verbally and following physical commands, but had difficulty with word retrieval and difficulty remembering what happened to her in the last 24 hours.  The difficulty retrieving words and memory problems were causing her emotional distress.  Kayla Harrison telephoned patient's sister, Kayla Harrison who has POA.  Patient's sister reported that she had spoken with patient's physician morning of 6/23 and he emphatically stated patient cannot return home, but must be admitted to [place] or a SNF. Her sister knows patient will have to meet certain criteria in order to stay in [place].  CN will follow up with patient's sister on care.

## 2021-03-16 NOTE — TOC Initial Note (Addendum)
Transition of Care [facility]) - Initial/Assessment Note    Patient Details  Name: Kayla Harrison MRN: [id number] Date of Birth: Mar 11, 1946  Transition of Care [facility]) CM/SW Contact:    Benard Halsted, LCSW Phone Number: 03/16/2021, 4:05 PM  Clinical Narrative:                 12:10pm- CSW spoke with patient and her friend, Kayla Harrison, at bedside with verbal permission for Kayla Harrison to remain in the room. CSW discussed PT recommendation of SNF placement at discharge since patient lives alone. Patient adamantly denied that she would go to SNF and stated she would be going home. CSW asked who would stay with her and she stated her local sister will come help her. She did state that she is active with [facility] and enjoyed their care. Patient's friend entered conversation and stated that patient may need to consider SNF as her sister has not provided much help in the past and her other sister is in [place]. Patient thought about it and said she may need to consider SNF. She provided CSW with permission to speak with her sister Kayla Harrison.   Outside the room, patient's friend disclosed that patient's brother passed away in a nursing facility and that was why patient had such a strong reaction to the idea of going. CSW attempted to call patient's sister Kayla Harrison but no voicemail.   4:11pm-CSW attempted to contact Kayla Harrison again, but [place] answered the phone and took CSW's contact info, stating Kayla Harrison would be back soon.   5pm-CSW received return call from patient's sister, Kayla Harrison. She stated that she does not like the idea of SNF either and is requesting CIR again for patient though PT has not recommended it at this time. She will be having physical therapy herself on Monday morning and then will make plans to come to [place] to take care of patient. She is requesting CSW follow up with her Monday afternoon to assess patient's progress.    Expected Discharge Plan: Skilled Nursing Facility Barriers to Discharge: Continued  Medical Work up   Patient Goals and CMS Choice Patient states their goals for this hospitalization and ongoing recovery are:: Rehab CMS Medicare.gov Compare Post Acute Care list provided to:: Patient Choice offered to / list presented to : Patient (friend at bedside)  Expected Discharge Plan and Services Expected Discharge Plan: [place] In-house Referral: Clinical Social Work   Post Acute Care Choice: [place] Living arrangements for the past 2 months: [place]                                      Prior Living Arrangements/Services Living arrangements for the past 2 months: Single Family Home Lives with:: Self Patient language and need for interpreter reviewed:: Yes Do you feel safe going back to the place where you live?: Yes      Need for Family Participation in Patient Care: Yes (Comment) Care giver support system in place?: No (comment) Current home services: DME Criminal Activity/Legal Involvement Pertinent to Current Situation/Hospitalization: No - Comment as needed  Activities of Daily Living Home Assistive Devices/Equipment: Cane (specify quad or straight), Eyeglasses, Walker (specify type) ADL Screening (condition at time of admission) Patient's cognitive ability adequate to safely complete daily activities?: Yes Is the patient deaf or have difficulty hearing?: No Does the patient have difficulty seeing, even when wearing glasses/contacts?: No  Does the patient have difficulty concentrating, remembering, or making decisions?: No Patient able to express need for assistance with ADLs?: Yes Does the patient have difficulty dressing or bathing?: Yes Independently performs ADLs?: No Communication: Independent Dressing (OT): Needs assistance Is this a change from baseline?: Pre-admission baseline Grooming: Needs assistance Is this a change from baseline?: Pre-admission baseline Feeding: Independent Bathing: Needs  assistance Is this a change from baseline?: Pre-admission baseline Toileting: Needs assistance Is this a change from baseline?: Pre-admission baseline In/Out Bed: Needs assistance Is this a change from baseline?: Pre-admission baseline Walks in Home: Needs assistance Is this a change from baseline?: Pre-admission baseline Does the patient have difficulty walking or climbing stairs?: Yes Weakness of Legs: Both Weakness of Arms/Hands: None  Permission Sought/Granted Permission sought to share information with : Facility Sport and exercise psychologist, Family Supports Permission granted to share information with : Yes, Verbal Permission Granted  Share Information with NAME: Kayla Harrison  Permission granted to share info w AGENCY: SNFs  Permission granted to share info w Relationship: Sister  Permission granted to share info w Contact Information: [phone]  Emotional Assessment Appearance:: Appears stated age Attitude/Demeanor/Rapport: Guarded Affect (typically observed): Appropriate, Guarded Orientation: : Oriented to Self, Oriented to Place, Oriented to  Time Alcohol / Substance Use: Not Applicable Psych Involvement: No (comment)  Admission diagnosis:  Coagulopathy ([place]) [D68.9] Intracranial hemorrhage ([place]) [I62.9] Hypertension, unspecified type [I10] ICH (intracerebral hemorrhage) ([place]) [I61.9] Patient Active Problem List   Diagnosis Date Noted   Pressure injury of skin 03/15/2021   Intracranial hemorrhage ([place]) 03/14/2021   Lower extremity edema    Polymicrobial bacterial infection    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    Enterococcus faecalis infection    Sepsis due to Escherichia coli ([place])    Debility 02/13/2021   Infection of wound hematoma 02/05/2021   Physical debility 12/22/2020   Tear of left hamstring 12/22/2020   Labral tear of left hip joint 12/22/2020   Acute blood loss anemia    Orthostatic hypotension 12/17/2020   History of CVA (cerebrovascular accident)  12/17/2020   Fall 12/15/2020   Hip hematoma, left, initial encounter 12/15/2020   Nondisplaced fracture of coronoid process of left ulna, initial encounter for closed fracture 12/15/2020   Multiple rib fractures 12/15/2020   COVID-19 virus infection 10/12/2020   Constipation 06/22/2020   Change in bowel habits 06/22/2020   Diverticulosis 06/22/2020   Dark stools 06/22/2020   Hemorrhoids 06/22/2020   Pacemaker 01/23/2020   Junctional rhythm 01/23/2020   Palpitations 01/23/2020   ICH (intracerebral hemorrhage) ([place]) 12/02/2019   A-fib ([place]) 12/02/2019   Anemia 12/02/2019   QT prolongation 12/02/2019   Hypothyroidism 12/02/2019   Gait abnormality 07/10/2018   S/P reverse total shoulder arthroplasty, 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   Persistent atrial fibrillation ([place])    Bilateral ureteral calculi 11/17/2017   Atrial fibrillation with RVR ([place]) 09/15/2017   Atrial flutter ([place]) 07/17/2017   Port catheter in place 04/02/2017   Non-Hodgkin lymphoma, unspecified, intrathoracic lymph nodes ([place]) 02/13/2017   Mediastinal mass 02/06/2017   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   Abnormal chest x-ray    Lung mass 02/03/2017   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   OSA (obstructive sleep apnea) 02/03/2015   History of renal calculi 11/16/2014   Renal calculi 11/16/2014   Family history of colon cancer 10/26/2014   Mechanical complication due to cardiac pacemaker pulse generator 11/06/2013   Dyspnea 05/12/2013   Hypothyroidism 04/21/2013   Pleural effusion 04/19/2013   Long term (current)  use of anticoagulants 12/20/2010   EPIDERMOID CYST 08/22/2010   Chronic diastolic heart failure ([place]) 08/06/2010   SYNCOPE AND COLLAPSE 07/27/2010   OSTEOARTHRITIS, KNEE, RIGHT 03/15/2010   Class 1 obesity due to excess calories with body mass index (BMI) of 33.0 to 33.9 in adult 12/07/2009   NONSPEC ELEVATION OF LEVELS OF TRANSAMINASE/LDH 09/08/2009   BREAST CANCER, HX OF 07/25/2009   COLONIC  POLYPS, HX OF 07/25/2009   TUBULOVILLOUS ADENOMA, COLON 04/29/2008   HYPERGLYCEMIA, FASTING 04/29/2008   HYPERLIPIDEMIA 06/22/2007   CARCINOMA, THYROID GLAND, HX OF 06/22/2007   PCP:  [name], MD Pharmacy:   [facility], [address] [place] [address] Phone: [phone] Fax: [phone]  [facility]  ([place]) - [address], [address] [place] [address] Phone: [phone] Fax: [phone]     Social Determinants of Health (SDOH) Interventions    Readmission Risk Interventions No flowsheet data found.

## 2021-03-17 DIAGNOSIS — I482 Chronic atrial fibrillation, unspecified: Secondary | ICD-10-CM

## 2021-03-17 DIAGNOSIS — I610 Nontraumatic intracerebral hemorrhage in hemisphere, subcortical: Principal | ICD-10-CM

## 2021-03-17 DIAGNOSIS — I615 Nontraumatic intracerebral hemorrhage, intraventricular: Secondary | ICD-10-CM

## 2021-03-17 LAB — CBC
HCT: 27.7 % — ABNORMAL LOW (ref 36.0–46.0)
Hemoglobin: 8.4 g/dL — ABNORMAL LOW (ref 12.0–15.0)
MCH: 26.8 pg (ref 26.0–34.0)
MCHC: 30.3 g/dL (ref 30.0–36.0)
MCV: 88.5 fL (ref 80.0–100.0)
Platelets: 220 10*3/uL (ref 150–400)
RBC: 3.13 MIL/uL — ABNORMAL LOW (ref 3.87–5.11)
RDW: 16.4 % — ABNORMAL HIGH (ref 11.5–15.5)
WBC: 6.5 10*3/uL (ref 4.0–10.5)
nRBC: 0 % (ref 0.0–0.2)

## 2021-03-17 LAB — BASIC METABOLIC PANEL
Anion gap: 6 (ref 5–15)
BUN: 18 mg/dL (ref 8–23)
CO2: 26 mmol/L (ref 22–32)
Calcium: 8.3 mg/dL — ABNORMAL LOW (ref 8.9–10.3)
Chloride: 101 mmol/L (ref 98–111)
Creatinine, Ser: 0.91 mg/dL (ref 0.44–1.00)
GFR, Estimated: >60 mL/min (ref >60)
Glucose, Bld: 97 mg/dL (ref 70–99)
Potassium: 3.5 mmol/L (ref 3.5–5.1)
Sodium: 133 mmol/L — ABNORMAL LOW (ref 135–145)

## 2021-03-17 LAB — LIPID PANEL
Cholesterol: 120 mg/dL (ref 0–200)
HDL: 36 mg/dL — ABNORMAL LOW (ref >40)
LDL Cholesterol: 70 mg/dL (ref 0–99)
Total CHOL/HDL Ratio: 3.3 RATIO
Triglycerides: 70 mg/dL (ref <150)
VLDL: 14 mg/dL (ref 0–40)

## 2021-03-17 LAB — HEMOGLOBIN A1C
Hgb A1c MFr Bld: 5.2 % (ref 4.8–5.6)
Mean Plasma Glucose: 102.54 mg/dL

## 2021-03-17 NOTE — Progress Notes (Addendum)
PROGRESS NOTE    Kayla Harrison  SWF:[id number] DOB: [birth year] DOA: 03/14/2021 PCP: [name], MD    Brief Narrative:  75 year old female with a history of atrial fibrillation on anticoagulation, presents to the hospital with subcortical hemorrhage.  This was felt to be related to hypotension in the setting of anticoagulation.  Patient received factor for concentrate reverse Eliquis.  She was admitted to the hospital for further work-up.  Transferred to [facility] service on 6/25. Currently awaiting plans on further disposition. She is otherwise stable for discharge   Assessment & Plan:   Active Problems:   HYPERLIPIDEMIA   Hypothyroidism   Persistent atrial fibrillation (HCC)   ICH (intracerebral hemorrhage) (HCC)   Anemia   Lower extremity edema   Intracranial hemorrhage (HCC)   Pressure injury of skin   Small left caudate head intracranial hemorrhage with small intraventricular hemorrhage -Secondary to hypertension and anticoagulation use -Unable to perform MRI due to pacemaker -Echo showed normal ejection fraction -LDL of 70 -A1c of 5.2 -No antiplatelet agents at this time due to bleed, will be readdressed on follow up with neuro -Seen by physical therapy/Occupational Therapy with recommendations for CIR -Patient sister is deciding on CIR vs. SNF and plans on discussing with TOC and IP rehab team  Permanent atrial fibrillation -Chronically anticoagulated with Eliquis -This is been discontinued in light of intracranial hemorrhage -No antithrombotic agents due to bleed -Continue on amiodarone  Hypertension -Blood pressures have been stable  Hyperlipidemia -Resume home dose of Crestor on discharge  Chronic anemia -Suspect is related to her chronic wounds -Baseline hemoglobin appears to be between 8 and 9 -Currently, hemoglobin appears at baseline, continue to follow  Chronic left thigh wound s/p wound VAC -Patient is followed by orthopedics, Dr. Lucia Gaskins -She  also follows with wound care center by Dr. Heber Garrard -Appears that her wounds are clinically improving -No signs of active infection at this time -She was seen by wound care service during hospital stay -Would recommend she follow-up with her regular wound care providers on discharge  DVT prophylaxis: SCD's Start: 03/15/21 0059  Code Status: Partial code, no intubation or BiPAP Family Communication: Discussed with patient  Disposition Plan: Status is: Inpatient  Remains inpatient appropriate because:Altered mental status and Unsafe d/c plan  Dispo: The patient is from: Home              Anticipated d/c is to: CIR versus SNF              Patient currently is medically stable to d/c.   Difficult to place patient No         Consultants:  Neurology Neurosurgery  Procedures:    Antimicrobials:      Subjective: She does not have any new complaints.  Objective: Vitals:   03/17/21 0454 03/17/21 0852 03/17/21 1311 03/17/21 1707  BP: 128/63 123/67 129/74 (!) 144/62  Pulse: 70 70 69 70  Resp:  20 19 15   Temp: 98.5 F (36.9 C) 98.6 F (37 C) (!) 97.3 F (36.3 C) 99.1 F (37.3 C)  TempSrc: Oral Oral Oral Oral  SpO2: 96% 97% 99% 96%  Weight:      Height:        Intake/Output Summary (Last 24 hours) at 03/17/2021 1800 Last data filed at 03/17/2021 1707 Gross per 24 hour  Intake 242 ml  Output 600 ml  Net -358 ml   Filed Weights   03/15/21 0000  Weight: 90.2 kg    Examination:  General exam:  Appears calm and comfortable  Respiratory system: Clear to auscultation. Respiratory effort normal. Cardiovascular system: S1 & S2 heard, RRR. No JVD, murmurs, rubs, gallops or clicks. No pedal edema. Gastrointestinal system: Abdomen is nondistended, soft and nontender. No organomegaly or masses felt. Normal bowel sounds heard. Central nervous system: Strength 5 out of 5 in upper extremities bilaterally, 3 out of 5 in lower extremities bilaterally Extremities: Wound VAC on  left lateral thigh without any surrounding erythema Skin: No rashes, lesions or ulcers Psychiatry: Appears to be mildly confused at times, pleasant, cooperative    Data Reviewed: I have personally reviewed following labs and imaging studies  CBC: Recent Labs  Lab 03/14/21 1743 03/17/21 0111  WBC 12.2* 6.5  NEUTROABS 9.9*  --   HGB 10.6* 8.4*  HCT 34.5* 27.7*  MCV 89.6 88.5  PLT 254 379   Basic Metabolic Panel: Recent Labs  Lab 03/14/21 1743 03/17/21 0111  NA 138 133*  K 3.6 3.5  CL 100 101  CO2 29 26  GLUCOSE 111* 97  BUN 18 18  CREATININE 0.98 0.91  CALCIUM 9.0 8.3*   GFR: Estimated Creatinine Clearance: 59.5 mL/min (by C-G formula based on SCr of 0.91 mg/dL). Liver Function Tests: Recent Labs  Lab 03/14/21 1743  AST 27  ALT 23  ALKPHOS 97  BILITOT 1.0  PROT 7.0  ALBUMIN 3.7   No results for input(s): LIPASE, AMYLASE in the last 168 hours. No results for input(s): AMMONIA in the last 168 hours. Coagulation Profile: No results for input(s): INR, PROTIME in the last 168 hours. Cardiac Enzymes: Recent Labs  Lab 03/14/21 1743  CKTOTAL 50   BNP (last 3 results) No results for input(s): PROBNP in the last 8760 hours. HbA1C: Recent Labs    03/17/21 0111  HGBA1C 5.2   CBG: No results for input(s): GLUCAP in the last 168 hours. Lipid Profile: Recent Labs    03/17/21 0111  CHOL 120  HDL 36*  LDLCALC 70  TRIG 70  CHOLHDL 3.3   Thyroid Function Tests: No results for input(s): TSH, T4TOTAL, FREET4, T3FREE, THYROIDAB in the last 72 hours. Anemia Panel: No results for input(s): VITAMINB12, FOLATE, FERRITIN, TIBC, IRON, RETICCTPCT in the last 72 hours. Sepsis Labs: No results for input(s): PROCALCITON, LATICACIDVEN in the last 168 hours.  Recent Results (from the past 240 hour(s))  SARS CORONAVIRUS 2 (TAT 6-24 HRS) Nasopharyngeal Nasopharyngeal Swab     Status: None   Collection Time: 03/14/21  9:03 PM   Specimen: Nasopharyngeal Swab  Result  Value Ref Range Status   SARS Coronavirus 2 NEGATIVE NEGATIVE Final    Comment: (NOTE) SARS-CoV-2 target nucleic acids are NOT DETECTED.  The SARS-CoV-2 RNA is generally detectable in upper and lower respiratory specimens during the acute phase of infection. Negative results do not preclude SARS-CoV-2 infection, do not rule out co-infections with other pathogens, and should not be used as the sole basis for treatment or other patient management decisions. Negative results must be combined with clinical observations, patient history, and epidemiological information. The expected result is Negative.  Fact Sheet for Patients: SugarRoll.be  Fact Sheet for Healthcare Providers: https://www.woods-mathews.com/  This test is not yet approved or cleared by the [place] FDA and  has been authorized for detection and/or diagnosis of SARS-CoV-2 by FDA under an Emergency Use Authorization (EUA). This EUA will remain  in effect (meaning this test can be used) for the duration of the COVID-19 declaration under Se ction 564(b)(1) of the Act, 21  U.S.C. section 360bbb-3(b)(1), unless the authorization is terminated or revoked sooner.  Performed at [facility], [place] [address], [place], [place] [address]   MRSA Next Gen by PCR, Nasal     Status: None   Collection Time: 03/14/21 11:35 PM   Specimen: Nasal Mucosa; Nasal Swab  Result Value Ref Range Status   MRSA by PCR Next Gen NOT DETECTED NOT DETECTED Final    Comment: (NOTE) The GeneXpert MRSA Assay (FDA approved for NASAL specimens only), is one component of a comprehensive MRSA colonization surveillance program. It is not intended to diagnose MRSA infection nor to guide or monitor treatment for MRSA infections. Test performance is not FDA approved in patients less than 47 years old. Performed at [facility], [place] [address] [place], [place] [address]          Radiology  Studies: ECHOCARDIOGRAM COMPLETE  Result Date: 03/16/2021    ECHOCARDIOGRAM REPORT   Patient Name:   KAYLIEGH BOYERS Date of Exam: 03/16/2021 Medical Rec #:  [id number]           Height:       66.0 in Accession #:    [id number]          Weight:       198.9 lb Date of Birth:  19-Apr-1946            BSA:          1.995 m Patient Age:    55 years            BP:           132/65 mmHg Patient Gender: F                   HR:           70 bpm. Exam Location:  Inpatient Procedure: 2D Echo, 3D Echo, Cardiac Doppler and Color Doppler Indications:    Stroke  History:        Patient has prior history of Echocardiogram examinations, most                 recent 03/09/2021. Abnormal ECG and Pacemaker, Arrythmias:Atrial                 Fibrillation and Atrial Flutter, Signs/Symptoms:Syncope, Dyspnea                 and Bacteremia; Risk Factors:Dyslipidemia and Sleep Apnea. ICH.  Sonographer:    Roseanna Rainbow RDCS Referring Phys: [place]  Sonographer Comments: Technically difficult study due to poor echo windows. IMPRESSIONS  1. Left ventricular ejection fraction, by estimation, is 55 to 60%. The left ventricle has normal function. The left ventricle has no regional wall motion abnormalities. Left ventricular diastolic parameters are indeterminate.  2. Right ventricular systolic function is normal. The right ventricular size is normal. There is moderately elevated pulmonary artery systolic pressure.  3. The mitral valve is abnormal. Severe mitral valve regurgitation. No evidence of mitral stenosis. Severe mitral annular calcification.  4. Tricuspid valve regurgitation is severe.  5. The aortic valve is calcified. Aortic valve regurgitation is trivial. Mild to moderate aortic valve sclerosis/calcification is present, without any evidence of aortic stenosis.  6. The inferior vena cava is dilated in size with <50% respiratory variability, suggesting right atrial pressure of 15 mmHg. FINDINGS  Left Ventricle: Left  ventricular ejection fraction, by estimation, is 55 to 60%. The left ventricle has normal function. The left ventricle  has no regional wall motion abnormalities. The left ventricular internal cavity size was normal in size. There is  no left ventricular hypertrophy. Left ventricular diastolic parameters are indeterminate. Right Ventricle: The right ventricular size is normal. Right ventricular systolic function is normal. There is moderately elevated pulmonary artery systolic pressure. The tricuspid regurgitant velocity is 3.06 m/s, and with an assumed right atrial pressure of 15 mmHg, the estimated right ventricular systolic pressure is 76.2 mmHg. Left Atrium: Left atrial size was normal in size. Right Atrium: Right atrial size was normal in size. Pericardium: There is no evidence of pericardial effusion. Mitral Valve: The mitral valve is abnormal. Severe mitral annular calcification. Severe mitral valve regurgitation. No evidence of mitral valve stenosis. MV peak gradient, 13.4 mmHg. The mean mitral valve gradient is 4.0 mmHg. Tricuspid Valve: The tricuspid valve is normal in structure. Tricuspid valve regurgitation is severe. No evidence of tricuspid stenosis. Aortic Valve: The aortic valve is calcified. Aortic valve regurgitation is trivial. Mild to moderate aortic valve sclerosis/calcification is present, without any evidence of aortic stenosis. Aortic valve mean gradient measures 9.0 mmHg. Aortic valve peak  gradient measures 15.4 mmHg. Aortic valve area, by VTI measures 2.09 cm. Pulmonic Valve: The pulmonic valve was not well visualized. Pulmonic valve regurgitation is not visualized. No evidence of pulmonic stenosis. Aorta: The aortic root is normal in size and structure. Venous: The inferior vena cava is dilated in size with less than 50% respiratory variability, suggesting right atrial pressure of 15 mmHg. IAS/Shunts: No atrial level shunt detected by color flow Doppler. Additional Comments: A device lead  is visualized.  LEFT VENTRICLE PLAX 2D LVIDd:         5.10 cm      Diastology LVIDs:         4.00 cm      LV e' medial:  9.90 cm/s LV PW:         0.90 cm      LV e' lateral: 11.00 cm/s LV IVS:        0.80 cm LVOT diam:     1.90 cm LV SV:         83 LV SV Index:   42 LVOT Area:     2.84 cm  LV Volumes (MOD) LV vol d, MOD A2C: 130.0 ml LV vol d, MOD A4C: 141.0 ml LV vol s, MOD A2C: 49.6 ml LV vol s, MOD A4C: 75.8 ml LV SV MOD A2C:     80.4 ml LV SV MOD A4C:     141.0 ml LV SV MOD BP:      70.7 ml RIGHT VENTRICLE            IVC RV S prime:     8.16 cm/s  IVC diam: 2.50 cm TAPSE (M-mode): 1.3 cm LEFT ATRIUM             Index       RIGHT ATRIUM           Index LA diam:        3.80 cm 1.90 cm/m  RA Area:     19.50 cm LA Vol (A2C):   41.7 ml 20.90 ml/m RA Volume:   53.00 ml  26.57 ml/m LA Vol (A4C):   59.1 ml 29.63 ml/m LA Biplane Vol: 51.7 ml 25.92 ml/m  AORTIC VALVE AV Area (Vmax):    2.24 cm AV Area (Vmean):   2.09 cm AV Area (VTI):     2.09 cm AV Vmax:  196.00 cm/s AV Vmean:          137.000 cm/s AV VTI:            0.398 m AV Peak Grad:      15.4 mmHg AV Mean Grad:      9.0 mmHg LVOT Vmax:         155.00 cm/s LVOT Vmean:        101.000 cm/s LVOT VTI:          0.293 m LVOT/AV VTI ratio: 0.74  AORTA Ao Root diam: 3.10 cm Ao Asc diam:  3.30 cm MITRAL VALVE                 TRICUSPID VALVE MV Area (PHT): 5.02 cm      TR Peak grad:   37.5 mmHg MV Area VTI:   2.17 cm      TR Vmax:        306.00 cm/s MV Peak grad:  13.4 mmHg MV Mean grad:  4.0 mmHg      SHUNTS MV Vmax:       1.83 m/s      Systemic VTI:  0.29 m MV Vmean:      81.1 cm/s     Systemic Diam: 1.90 cm MV Decel Time: 151 msec MR Peak grad:    111.5 mmHg MR Mean grad:    69.0 mmHg MR Vmax:         528.00 cm/s MR Vmean:        383.0 cm/s MR PISA:         0.57 cm MR PISA Eff ROA: 3 mm MR PISA Radius:  0.30 cm Kirk Ruths MD Electronically signed by Kirk Ruths MD Signature Date/Time: 03/16/2021/1:01:03 PM    Final         Scheduled  Meds:  amiodarone  200 mg Oral BID   Chlorhexidine Gluconate Cloth  6 each Topical Q0600   collagenase   Topical Daily   levothyroxine  137 mcg Oral Once per day on Sun Mon Tue Wed Thu Fri   levothyroxine  68.5 mcg Oral Once per day on Sat   mouth rinse  15 mL Mouth Rinse BID   pantoprazole  40 mg Oral Daily   senna-docusate  1 tablet Oral BID   Continuous Infusions:   LOS: 3 days    Time spent: 108mins    Kathie Dike, MD [facility]   If 7PM-7AM, please contact night-coverage [email or web address]  03/17/2021, 6:00 PM

## 2021-03-17 NOTE — Evaluation (Signed)
Occupational Therapy Evaluation Patient Details Name: Kayla Harrison MRN: [id number] DOB: Dec 05, 1945 Today's Date: 03/17/2021    History of Present Illness Pt is a 75 y.o. female who presented 6/22 with AMS after being found on ground at home. Pt with nontraumatic ICH. CT head shows an acute intracranial hemorrhage that extends into the left lateral ventricle and left foramen of Monro into the third ventricle.  She has very mild dilation of her left lateral ventricle with about 5 mm of left-to-right shift. PMH: anemia, arthritis, breast cancer, brucellosis, chronic bil pleural effusions, endometriosis, hepatitis, HTN, hx of dysphagia, hypothyroidism, lung cancer, nephrolithiasis, non Hodgkin's lymphoma, a-fib, cardiac pacemaker, rotator cuff tear, CVA, SVC syndrome, and thyroid cancer.   Clinical Impression   Patient admitted for the diagnosis above.  PTA she lived alone in a one level condo, and appeared to have PRN assist from friends and family.  The patient used a 4WRW for mobility, and stated she could care for her ADL, toileting, light meal prep and basic home management at a Mod I level.  Barriers are listed below.  Currently she is needing up to Mod A for basic mobility at a RW level and for lower body ADL.  CIR has been recommended by PT based on improving mobility.  Given her prior level of function, and ability to participate, CIR is appropriate.  OT is recommended in the acute setting to maximize functional status.      Follow Up Recommendations  CIR    Equipment Recommendations  3 in 1 bedside commode    Recommendations for Other Services Rehab consult     Precautions / Restrictions Precautions Precautions: Fall Precaution Comments: L hip wound vac; SBP goal < 140 Restrictions Weight Bearing Restrictions: No LUE Weight Bearing: Touch down weight bearing LLE Weight Bearing: Weight bearing as tolerated      Mobility Bed Mobility Overal bed mobility: Needs  Assistance Bed Mobility: Supine to Sit     Supine to sit: [facility] elevated;Min assist       Patient Response: Flat affect  Transfers Overall transfer level: Needs assistance Equipment used: Rolling walker (2 wheeled) Transfers: Sit to/from [facility] Sit to Stand: Mod assist;From elevated surface Stand pivot transfers: Mod assist;From elevated surface            Balance Overall balance assessment: Needs assistance Sitting-balance support: No upper extremity supported;Feet supported Sitting balance-Leahy Scale: Fair Sitting balance - Comments: Static sitting EOB with supervision.   Standing balance support: Bilateral upper extremity supported;During functional activity   Standing balance comment: Reliant on bil UE support and physical assistance.                           ADL either performed or assessed with clinical judgement   ADL Overall ADL's : Needs assistance/impaired                 Upper Body Dressing : Minimal assistance;Sitting   Lower Body Dressing: Moderate assistance;Sit to/from stand               Functional mobility during ADLs: Cueing for safety;Cueing for sequencing;Rolling walker;Moderate assistance       Vision Baseline Vision/History: Wears glasses Wears Glasses: Reading only Patient Visual Report: No change from baseline Vision Assessment?: No apparent visual deficits     Perception     Praxis      Pertinent Vitals/Pain Pain Assessment: Faces Pain Score: 0-No pain Pain Intervention(s): Monitored  during session     Hand Dominance Right   Extremity/Trunk Assessment Upper Extremity Assessment Upper Extremity Assessment: Generalized weakness   Lower Extremity Assessment Lower Extremity Assessment: Defer to PT evaluation   Cervical / Trunk Assessment Cervical / Trunk Assessment: Normal   Communication Communication Communication: No difficulties   Cognition Arousal/Alertness:  Awake/alert Behavior During Therapy: Flat affect Overall Cognitive Status: Impaired/Different from baseline                       Memory: Decreased short-term memory Following Commands: Follows one step commands consistently;Follows one step commands with increased time;Follows multi-step commands inconsistently Safety/Judgement: Decreased awareness of deficits Awareness: Emergent Problem Solving: Slow processing;Difficulty sequencing;Requires verbal cues;Requires tactile cues                      Home Living Family/patient expects to be discharged to:: Private residence Living Arrangements: Alone Available Help at Discharge: Family;Available PRN/intermittently Type of Home: Apartment Home Access: Level entry     Home Layout: One level     Bathroom Shower/Tub: Occupational psychologist: Standard Bathroom Accessibility: Yes How Accessible: Accessible via walker Home Equipment: [place] - 4 wheels;Shower seat - built in;Cane - single point;Grab bars - tub/shower      Lives With: Alone    Prior Functioning/Environment Level of Independence: Independent with assistive device(s)        Comments: mod I with rollator.  States she was able to care for meds, meals and basic home management.  Has not driven in a few months.        OT Problem List: Decreased strength;Decreased activity tolerance;Impaired balance (sitting and/or standing);Decreased safety awareness;Decreased cognition      OT Treatment/Interventions: Self-care/ADL training;Therapeutic exercise;DME and/or AE instruction;Balance training;Cognitive remediation/compensation;Therapeutic activities    OT Goals(Current goals can be found in the care plan section) Acute Rehab OT Goals Patient Stated Goal: patient unsure OT Goal Formulation: Patient unable to participate in goal setting Time For Goal Achievement: 03/31/21 Potential to Achieve Goals: Fair ADL Goals Pt Will Perform Grooming: with  set-up;sitting;standing Pt Will Perform Upper Body Bathing: with set-up;sitting Pt Will Perform Lower Body Bathing: with min assist;sit to/from stand Pt Will Perform Upper Body Dressing: with supervision;sitting Pt Will Perform Lower Body Dressing: with min assist;sit to/from stand Pt Will Transfer to Toilet: with min guard assist;ambulating;regular height toilet  OT Frequency: Min 2X/week   Barriers to D/C:    none noted       Co-evaluation              AM-PAC OT "6 Clicks" Daily Activity     Outcome Measure Help from another person eating meals?: None Help from another person taking care of personal grooming?: None Help from another person toileting, which includes using toliet, bedpan, or urinal?: A Lot Help from another person bathing (including washing, rinsing, drying)?: A Lot Help from another person to put on and taking off regular upper body clothing?: A Little Help from another person to put on and taking off regular lower body clothing?: A Lot 6 Click Score: 17   End of Session Equipment Utilized During Treatment: Surveyor, mining Communication: Mobility status  Activity Tolerance: Patient tolerated treatment well Patient left: in chair;with call bell/phone within reach;with chair alarm set  OT Visit Diagnosis: Unsteadiness on feet (R26.81);Muscle weakness (generalized) (M62.81);Other symptoms and signs involving cognitive function;Dizziness and giddiness (R42)  Time: 7505-1833 OT Time Calculation (min): 24 min Charges:  OT General Charges $OT Visit: 1 Visit OT Evaluation $OT Eval Moderate Complexity: 1 Mod OT Treatments $Self Care/Home Management : 8-22 mins  03/17/2021  [place], OTR/L  Acute Rehabilitation Services  Office:  [place] 03/17/2021, 9:57 AM

## 2021-03-17 NOTE — Progress Notes (Addendum)
STROKE TEAM PROGRESS NOTE   INTERVAL HISTORY She is alert, much improved and sitting up in chair today. She is able to slowly orient w/help of the whiteboard. However, when I ask her who is in the picture in front of her she cannot name her long time best friends. She tells me she is aware her brain is "foggy". She did not remember speaking to me yesterday or that she had an [place].  Vitals:   03/16/21 2035 03/17/21 0014 03/17/21 0454 03/17/21 0852  BP: (!) 161/78 128/64 128/63 123/67  Pulse: 70 63 70 70  Resp:    20  Temp: 98.8 F (37.1 C) 98.2 F (36.8 C) 98.5 F (36.9 C) 98.6 F (37 C)  TempSrc: Oral Oral Oral Oral  SpO2: 99%  96% 97%  Weight:      Height:       CBC:  Recent Labs  Lab 03/14/21 1743 03/17/21 0111  WBC 12.2* 6.5  NEUTROABS 9.9*  --   HGB 10.6* 8.4*  HCT 34.5* 27.7*  MCV 89.6 88.5  PLT 254 681    Basic Metabolic Panel:  Recent Labs  Lab 03/14/21 1743 03/17/21 0111  NA 138 133*  K 3.6 3.5  CL 100 101  CO2 29 26  GLUCOSE 111* 97  BUN 18 18  CREATININE 0.98 0.91  CALCIUM 9.0 8.3*    Lipid Panel:  Recent Labs  Lab 03/17/21 0111  CHOL 120  TRIG 70  HDL 36*  CHOLHDL 3.3  VLDL 14  LDLCALC 70   HgbA1c:  Recent Labs  Lab 03/17/21 0111  HGBA1C 5.2   Urine Drug Screen:  Recent Labs  Lab 03/14/21 1738  LABOPIA NONE DETECTED  COCAINSCRNUR NONE DETECTED  LABBENZ NONE DETECTED  AMPHETMU NONE DETECTED  THCU NONE DETECTED  LABBARB NONE DETECTED     Alcohol Level  Recent Labs  Lab 03/14/21 1743  ETH <10     IMAGING past 24 hours No results found.  PHYSICAL EXAM General: Appears well-developed, ill appearing, no distress Psych: Affect appropriate to situation Eyes: No scleral injection HENT: No OP obstrucion Head: Normocephalic.  Cardiovascular: Irregular; severe bilat LE lymphedema Respiratory: Effort normal and breath sounds normal to anterior ascultation GI: Soft.  No distension. There is no tenderness.  Skin: Wound vac in  place with dressing in lateral left thigh. Medial left thigh has wound as well.   Neurological Examination Mental Status: Alert, orients to name, dob, knows this is a hospital, but cant name city or exact name and cannot state why she is here. Gives date correctly by looking at white board. Speech is fluent, but slow. Cognition is impaired and she is unable to name her long time best friends in a picture.  Cranial Nerves: II: vF full III,IV, VI: EOMI. no ptosis or diplopia noted V: Facial sensation seems symmetric  VII: Facial movement with very mild flattening of the right nasolabial fold VIII: hearing is intact to voice X: Uvula is midline and palate elevates symmetrically XI: Shoulder shrug is symmetric. XII: tongue is midline Motor: Tone is normal. Bulk is normal. Diffusely weak throughout lifting either leg against gravity and is in much pain on left d/t wound. She is able to wiggle toes and foot push slightly less on right. Moves both arms equally, both 4/5. Sensory: Sensation seems intact to LT in arms and legs. Cerebellar: unable Gait: unable  ASSESSMENT/PLAN [name] is a 75 y.o. female with history of atrial fibrillation on anticoagulation  with Eliquis who presents with subcortical hemorrhage. This is likely hypertensive in nature, though another possibility would be ischemic stroke with hemorrhagic conversion. She was reversed w/factor 4 concentrate.  Small left caudate head ICH with small IVH:  secondary to HTN and anticoagulation use CTA head & neck no underlying etiology for ICH; mild edema MRI unable d/t pacemaker  2D Echo EF 55-60% on 6/17 LDL 70 HgbA1c 5.2 VTE prophylaxis - SCDs only d/t bleed; Ok to start lovenox in next 24h Eliquis (apixaban) daily prior to admission, now on none d/t bleed Therapy recommendations:  SNF vs. CIR Disposition:  pending  Chronic Afib On eliquis PTA No antithrombotic on hold due to [place] Rate controlled On home  amiodarone Further antithrombotic regimen will defer to Dr. Leonie Man at outpt follow up  Hypertension Home meds:  lasix Stable SBP goal <160 Long-term BP goal normotensive  Hyperlipidemia Home meds:  Crestor 10mg , resumed in hospital LDL 70 goal < 70 Continue statin at discharge  Other Stroke Risk Factors Advanced Age >/= 60  Obesity, Body mass index is 32.1 kg/m., BMI >/= 30 associated with increased stroke risk, recommend weight loss, diet and exercise as appropriate  Hx stroke/TIA Family hx stroke Congestive heart failure  Other Active Problems Anemia Severe Lymphadema Wounds- with wound vac/wound care consulted H/o malignancy   Continue mobilization out of bed and therapy consults. Medical hospitalist team will assume care now that she is out of the ICU. She is a partial code with [facility]. She will need intensive rehab and care. She should follow up with [facility] 6 weeks after d/c. We will sign off. Please call back if needed.    Hospital day # 3  Desiree Metzger-Cihelka, ARNP-C, ANVP-BC Pager: [phone]   ATTENDING NOTE: I reviewed above note and agree with the assessment and plan. Pt was seen and examined.   75 year old female with history of A. fib on Eliquis, hyperlipidemia, lymphoma, pacemaker admitted for confusion.  CT showed small left caudate head ICH with small IVH.  CTA head and neck negative.  EF 55 to 60%.  LDL 70, A1c 5.2.  UDS negative.  Creatinine 0.98.  WBC normalized.  Repeat CT stable.  On exam, 2 family members in the room, patient sitting in chair, awake alert, orientated x3, no aphasia, follows simple commands, able to name and repeat.  Able to backward spell world, serial 7 with 4/5 correct.  No apraxia.  Registration 3/3, delayed recall 1/3.  Fund of knowledge decreased.  No gaze palsy, visual field full, slight right nasolabial fold flattening.  Moving all extremities symmetrically.  Sensation symmetrical, finger-to-nose bilaterally intact.  Gait not  tested.  Etiology for patient ICH likely due to hypertension in the setting of Eliquis use.  Currently anticoagulation was on hold due to [place].  She needs follow-up with Dr. Leonie Man in 4 to 6 weeks to decide on future regimen.  Continue Crestor 10 on discharge.  For detailed assessment and plan, please refer to above as I have made changes wherever appropriate.   Neurology will sign off. Please call with questions. Pt will follow up with stroke clinic Dr. Leonie Man at [facility] in about 4-6 weeks.    Rosalin Hawking, MD PhD Stroke Neurology 03/17/2021 4:04 PM     To contact Stroke Continuity provider, please refer to [email or web address] After hours, contact General Neurology

## 2021-03-17 NOTE — Progress Notes (Signed)
Inpatient Rehab Admissions Coordinator:   I spoke with Pt. And sister regarding potential CIR admission. Pt.'s sister requests more time to think about whether or not family can provide 24/7 support after discharge from CIR or if they want to pursue SNF for rehab instead. I will reach out to her on Monday for a decision.   Clemens Catholic, [place], [place] Admissions Coordinator  [phone] ([place]) [phone] (office)

## 2021-03-18 DIAGNOSIS — E782 Mixed hyperlipidemia: Secondary | ICD-10-CM

## 2021-03-18 DIAGNOSIS — I10 Essential (primary) hypertension: Secondary | ICD-10-CM

## 2021-03-18 DIAGNOSIS — D649 Anemia, unspecified: Secondary | ICD-10-CM

## 2021-03-18 DIAGNOSIS — I4819 Other persistent atrial fibrillation: Secondary | ICD-10-CM

## 2021-03-18 MED ORDER — POTASSIUM CHLORIDE CRYS ER 20 MEQ PO TBCR
40.0000 meq | EXTENDED_RELEASE_TABLET | Freq: Once | ORAL | Status: AC
  Administered 2021-03-18: 40 meq via ORAL
  Filled 2021-03-18: qty 2

## 2021-03-18 MED ORDER — LISINOPRIL 10 MG PO TABS
10.0000 mg | ORAL_TABLET | Freq: Every day | ORAL | Status: DC
  Administered 2021-03-18 – 2021-03-19 (×2): 10 mg via ORAL
  Filled 2021-03-18 (×2): qty 1

## 2021-03-18 NOTE — Progress Notes (Signed)
PROGRESS NOTE    Kayla Harrison  IOE:[id number] DOB: [birth year] DOA: 03/14/2021 PCP: [name], MD    Brief Narrative:  75 year old female with a history of atrial fibrillation on anticoagulation, presents to the hospital with subcortical hemorrhage.  This was felt to be related to hypotension in the setting of anticoagulation.  Patient received factor for concentrate reverse Eliquis.  She was admitted to the hospital for further work-up.  Transferred to [facility] service on 6/25. Currently awaiting plans on further disposition. She is otherwise stable for discharge   Assessment & Plan:   Active Problems:   HYPERLIPIDEMIA   Hypothyroidism   Persistent atrial fibrillation (HCC)   ICH (intracerebral hemorrhage) (HCC)   Anemia   Lower extremity edema   Intracranial hemorrhage (HCC)   Pressure injury of skin   Hypertension   Small left caudate head intracranial hemorrhage with small intraventricular hemorrhage -Secondary to hypertension and anticoagulation use -Unable to perform MRI due to pacemaker -Echo showed normal ejection fraction -LDL of 70 -A1c of 5.2 -No antiplatelet agents at this time due to bleed, will be readdressed on follow up with neuro -Seen by physical therapy/Occupational Therapy with recommendations for CIR -Patient sister is deciding on CIR vs. SNF and plans on discussing with TOC and IP rehab team tomorrow  Permanent atrial fibrillation -Chronically anticoagulated with Eliquis -This is been discontinued in light of intracranial hemorrhage -No antithrombotic agents due to bleed -Continue on amiodarone  Hypertension -blood pressures have been trending up -will start on lisinopril  Hyperlipidemia -Resume home dose of Crestor on discharge  Chronic anemia -Suspect is related to her chronic wounds -Baseline hemoglobin appears to be between 8 and 9 -Currently, hemoglobin appears at baseline, continue to follow  Chronic left thigh wound s/p wound  VAC -Patient is followed by orthopedics, Dr. Lucia Gaskins -She also follows with wound care center by Dr. Heber [place] -Appears that her wounds are clinically improving -No signs of active infection at this time -She was seen by wound care service during hospital stay -Would recommend she follow-up with her regular wound care providers on discharge  DVT prophylaxis: SCD's Start: 03/15/21 0059  Code Status: Partial code, no intubation or BiPAP Family Communication: Discussed with patient's sister [name], on the phone during visit with patient Disposition Plan: Status is: Inpatient  Remains inpatient appropriate because:Altered mental status and Unsafe d/c plan  Dispo: The patient is from: Home              Anticipated d/c is to: CIR versus SNF              Patient currently is medically stable to d/c.   Difficult to place patient No         Consultants:  Neurology Neurosurgery  Procedures:    Antimicrobials:      Subjective: She is concerned about the persistent weakness in her legs and her difficulty walking.  Still has some expressive aphasia  Objective: Vitals:   03/18/21 0039 03/18/21 0350 03/18/21 0741 03/18/21 1207  BP: 131/66 140/63 (!) 151/75 (!) 154/74  Pulse: 62 67 70 69  Resp: 17 18 18 18   Temp: 99 F (37.2 C) 98.1 F (36.7 C) 98 F (36.7 C)   TempSrc:  Oral Oral   SpO2: 93% 95% 98% 96%  Weight:      Height:        Intake/Output Summary (Last 24 hours) at 03/18/2021 1557 Last data filed at 03/18/2021 1220 Gross per 24 hour  Intake 360 ml  Output 650 ml  Net -290 ml   Filed Weights   03/15/21 0000  Weight: 90.2 kg    Examination:  General exam: Appears calm and comfortable  Respiratory system: Clear to auscultation. Respiratory effort normal. Cardiovascular system: S1 & S2 heard, RRR. No JVD, murmurs, rubs, gallops or clicks. No pedal edema. Gastrointestinal system: Abdomen is nondistended, soft and nontender. No organomegaly or masses felt. Normal  bowel sounds heard. Central nervous system: Strength 5 out of 5 in upper extremities bilaterally, 3 out of 5 in lower extremities bilaterally Extremities: Wound VAC on left lateral thigh without any surrounding erythema Skin: No rashes, lesions or ulcers Psychiatry: Appears to be mildly confused at times, pleasant, cooperative    Data Reviewed: I have personally reviewed following labs and imaging studies  CBC: Recent Labs  Lab 03/14/21 1743 03/17/21 0111  WBC 12.2* 6.5  NEUTROABS 9.9*  --   HGB 10.6* 8.4*  HCT 34.5* 27.7*  MCV 89.6 88.5  PLT 254 622   Basic Metabolic Panel: Recent Labs  Lab 03/14/21 1743 03/17/21 0111  NA 138 133*  K 3.6 3.5  CL 100 101  CO2 29 26  GLUCOSE 111* 97  BUN 18 18  CREATININE 0.98 0.91  CALCIUM 9.0 8.3*   GFR: Estimated Creatinine Clearance: 59.5 mL/min (by C-G formula based on SCr of 0.91 mg/dL). Liver Function Tests: Recent Labs  Lab 03/14/21 1743  AST 27  ALT 23  ALKPHOS 97  BILITOT 1.0  PROT 7.0  ALBUMIN 3.7   No results for input(s): LIPASE, AMYLASE in the last 168 hours. No results for input(s): AMMONIA in the last 168 hours. Coagulation Profile: No results for input(s): INR, PROTIME in the last 168 hours. Cardiac Enzymes: Recent Labs  Lab 03/14/21 1743  CKTOTAL 50   BNP (last 3 results) No results for input(s): PROBNP in the last 8760 hours. HbA1C: Recent Labs    03/17/21 0111  HGBA1C 5.2   CBG: No results for input(s): GLUCAP in the last 168 hours. Lipid Profile: Recent Labs    03/17/21 0111  CHOL 120  HDL 36*  LDLCALC 70  TRIG 70  CHOLHDL 3.3   Thyroid Function Tests: No results for input(s): TSH, T4TOTAL, FREET4, T3FREE, THYROIDAB in the last 72 hours. Anemia Panel: No results for input(s): VITAMINB12, FOLATE, FERRITIN, TIBC, IRON, RETICCTPCT in the last 72 hours. Sepsis Labs: No results for input(s): PROCALCITON, LATICACIDVEN in the last 168 hours.  Recent Results (from the past 240 hour(s))   SARS CORONAVIRUS 2 (TAT 6-24 HRS) Nasopharyngeal Nasopharyngeal Swab     Status: None   Collection Time: 03/14/21  9:03 PM   Specimen: Nasopharyngeal Swab  Result Value Ref Range Status   SARS Coronavirus 2 NEGATIVE NEGATIVE Final    Comment: (NOTE) SARS-CoV-2 target nucleic acids are NOT DETECTED.  The SARS-CoV-2 RNA is generally detectable in upper and lower respiratory specimens during the acute phase of infection. Negative results do not preclude SARS-CoV-2 infection, do not rule out co-infections with other pathogens, and should not be used as the sole basis for treatment or other patient management decisions. Negative results must be combined with clinical observations, patient history, and epidemiological information. The expected result is Negative.  Fact Sheet for Patients: SugarRoll.be  Fact Sheet for Healthcare Providers: https://www.woods-mathews.com/  This test is not yet approved or cleared by the [place] FDA and  has been authorized for detection and/or diagnosis of SARS-CoV-2 by FDA under an Emergency Use Authorization (EUA). This EUA  will remain  in effect (meaning this test can be used) for the duration of the COVID-19 declaration under Se ction 564(b)(1) of the Act, 21 U.S.C. section 360bbb-3(b)(1), unless the authorization is terminated or revoked sooner.  Performed at [facility], [place] [address] [place], [place] [address]   MRSA Next Gen by PCR, Nasal     Status: None   Collection Time: 03/14/21 11:35 PM   Specimen: Nasal Mucosa; Nasal Swab  Result Value Ref Range Status   MRSA by PCR Next Gen NOT DETECTED NOT DETECTED Final    Comment: (NOTE) The GeneXpert MRSA Assay (FDA approved for NASAL specimens only), is one component of a comprehensive MRSA colonization surveillance program. It is not intended to diagnose MRSA infection nor to guide or monitor treatment for MRSA infections. Test  performance is not FDA approved in patients less than 53 years old. Performed at [facility], [place] [address] [place], [place] [address]          Radiology Studies: No results found.      Scheduled Meds:  amiodarone  200 mg Oral BID   Chlorhexidine Gluconate Cloth  6 each Topical Q0600   collagenase   Topical Daily   levothyroxine  137 mcg Oral Once per day on Sun Mon Tue Wed Thu Fri   levothyroxine  68.5 mcg Oral Once per day on Sat   lisinopril  10 mg Oral Daily   mouth rinse  15 mL Mouth Rinse BID   pantoprazole  40 mg Oral Daily   potassium chloride  40 mEq Oral Once   senna-docusate  1 tablet Oral BID   Continuous Infusions:   LOS: 4 days    Time spent: 74mins    Kathie Dike, MD [facility]   If 7PM-7AM, please contact night-coverage [email or web address]  03/18/2021, 3:57 PM

## 2021-03-19 ENCOUNTER — Telehealth: Payer: Self-pay | Admitting: *Deleted

## 2021-03-19 ENCOUNTER — Encounter (HOSPITAL_COMMUNITY): Payer: Self-pay | Admitting: Physical Medicine & Rehabilitation

## 2021-03-19 ENCOUNTER — Inpatient Hospital Stay (HOSPITAL_COMMUNITY)
Admission: RE | Admit: 2021-03-19 | Discharge: 2021-04-03 | DRG: 057 | Disposition: A | Discharge status: Skilled Nursing Facility | Payer: Medicare Other | Source: Intra-hospital | Attending: Physical Medicine & Rehabilitation | Admitting: Physical Medicine & Rehabilitation

## 2021-03-19 ENCOUNTER — Other Ambulatory Visit: Payer: Self-pay

## 2021-03-19 DIAGNOSIS — Z800 Family history of malignant neoplasm of digestive organs: Secondary | ICD-10-CM

## 2021-03-19 DIAGNOSIS — N390 Urinary tract infection, site not specified: Secondary | ICD-10-CM | POA: Diagnosis present

## 2021-03-19 DIAGNOSIS — R2689 Other abnormalities of gait and mobility: Secondary | ICD-10-CM | POA: Diagnosis present

## 2021-03-19 DIAGNOSIS — Z6832 Body mass index (BMI) 32.0-32.9, adult: Secondary | ICD-10-CM | POA: Diagnosis not present

## 2021-03-19 DIAGNOSIS — D649 Anemia, unspecified: Secondary | ICD-10-CM | POA: Diagnosis present

## 2021-03-19 DIAGNOSIS — L89892 Pressure ulcer of other site, stage 2: Secondary | ICD-10-CM | POA: Diagnosis present

## 2021-03-19 DIAGNOSIS — Z85118 Personal history of other malignant neoplasm of bronchus and lung: Secondary | ICD-10-CM

## 2021-03-19 DIAGNOSIS — R2681 Unsteadiness on feet: Secondary | ICD-10-CM | POA: Diagnosis not present

## 2021-03-19 DIAGNOSIS — R58 Hemorrhage, not elsewhere classified: Secondary | ICD-10-CM | POA: Diagnosis not present

## 2021-03-19 DIAGNOSIS — I69198 Other sequelae of nontraumatic intracerebral hemorrhage: Principal | ICD-10-CM

## 2021-03-19 DIAGNOSIS — Z8572 Personal history of non-Hodgkin lymphomas: Secondary | ICD-10-CM | POA: Diagnosis not present

## 2021-03-19 DIAGNOSIS — Z950 Presence of cardiac pacemaker: Secondary | ICD-10-CM | POA: Diagnosis not present

## 2021-03-19 DIAGNOSIS — I619 Nontraumatic intracerebral hemorrhage, unspecified: Secondary | ICD-10-CM | POA: Diagnosis present

## 2021-03-19 DIAGNOSIS — I69298 Other sequelae of other nontraumatic intracranial hemorrhage: Secondary | ICD-10-CM

## 2021-03-19 DIAGNOSIS — B9620 Unspecified Escherichia coli [E. coli] as the cause of diseases classified elsewhere: Secondary | ICD-10-CM | POA: Diagnosis present

## 2021-03-19 DIAGNOSIS — E890 Postprocedural hypothyroidism: Secondary | ICD-10-CM

## 2021-03-19 DIAGNOSIS — I629 Nontraumatic intracranial hemorrhage, unspecified: Secondary | ICD-10-CM

## 2021-03-19 DIAGNOSIS — Z923 Personal history of irradiation: Secondary | ICD-10-CM | POA: Diagnosis not present

## 2021-03-19 DIAGNOSIS — I610 Nontraumatic intracerebral hemorrhage in hemisphere, subcortical: Secondary | ICD-10-CM

## 2021-03-19 DIAGNOSIS — Z853 Personal history of malignant neoplasm of breast: Secondary | ICD-10-CM | POA: Diagnosis not present

## 2021-03-19 DIAGNOSIS — E669 Obesity, unspecified: Secondary | ICD-10-CM | POA: Diagnosis present

## 2021-03-19 DIAGNOSIS — Z9884 Bariatric surgery status: Secondary | ICD-10-CM

## 2021-03-19 DIAGNOSIS — R4182 Altered mental status, unspecified: Secondary | ICD-10-CM | POA: Diagnosis present

## 2021-03-19 DIAGNOSIS — R296 Repeated falls: Secondary | ICD-10-CM | POA: Diagnosis present

## 2021-03-19 DIAGNOSIS — Z602 Problems related to living alone: Secondary | ICD-10-CM | POA: Diagnosis present

## 2021-03-19 DIAGNOSIS — E785 Hyperlipidemia, unspecified: Secondary | ICD-10-CM | POA: Diagnosis present

## 2021-03-19 DIAGNOSIS — I482 Chronic atrial fibrillation, unspecified: Secondary | ICD-10-CM

## 2021-03-19 DIAGNOSIS — Z8673 Personal history of transient ischemic attack (TIA), and cerebral infarction without residual deficits: Secondary | ICD-10-CM

## 2021-03-19 DIAGNOSIS — Z79899 Other long term (current) drug therapy: Secondary | ICD-10-CM | POA: Diagnosis not present

## 2021-03-19 DIAGNOSIS — Z20822 Contact with and (suspected) exposure to covid-19: Secondary | ICD-10-CM | POA: Diagnosis present

## 2021-03-19 DIAGNOSIS — Z7401 Bed confinement status: Secondary | ICD-10-CM | POA: Diagnosis not present

## 2021-03-19 DIAGNOSIS — Z8249 Family history of ischemic heart disease and other diseases of the circulatory system: Secondary | ICD-10-CM

## 2021-03-19 DIAGNOSIS — Z85850 Personal history of malignant neoplasm of thyroid: Secondary | ICD-10-CM | POA: Diagnosis not present

## 2021-03-19 DIAGNOSIS — R41841 Cognitive communication deficit: Secondary | ICD-10-CM | POA: Diagnosis not present

## 2021-03-19 DIAGNOSIS — K5900 Constipation, unspecified: Secondary | ICD-10-CM | POA: Diagnosis present

## 2021-03-19 DIAGNOSIS — I951 Orthostatic hypotension: Secondary | ICD-10-CM | POA: Diagnosis not present

## 2021-03-19 DIAGNOSIS — R410 Disorientation, unspecified: Secondary | ICD-10-CM | POA: Diagnosis not present

## 2021-03-19 DIAGNOSIS — Z833 Family history of diabetes mellitus: Secondary | ICD-10-CM | POA: Diagnosis not present

## 2021-03-19 DIAGNOSIS — S71102S Unspecified open wound, left thigh, sequela: Secondary | ICD-10-CM

## 2021-03-19 DIAGNOSIS — I10 Essential (primary) hypertension: Secondary | ICD-10-CM | POA: Diagnosis present

## 2021-03-19 DIAGNOSIS — Z9221 Personal history of antineoplastic chemotherapy: Secondary | ICD-10-CM | POA: Diagnosis not present

## 2021-03-19 DIAGNOSIS — R55 Syncope and collapse: Secondary | ICD-10-CM | POA: Diagnosis not present

## 2021-03-19 DIAGNOSIS — M6281 Muscle weakness (generalized): Secondary | ICD-10-CM | POA: Diagnosis not present

## 2021-03-19 LAB — CBC
HCT: 30.3 % — ABNORMAL LOW (ref 36.0–46.0)
Hemoglobin: 9.2 g/dL — ABNORMAL LOW (ref 12.0–15.0)
MCH: 26.7 pg (ref 26.0–34.0)
MCHC: 30.4 g/dL (ref 30.0–36.0)
MCV: 87.8 fL (ref 80.0–100.0)
Platelets: 234 10*3/uL (ref 150–400)
RBC: 3.45 MIL/uL — ABNORMAL LOW (ref 3.87–5.11)
RDW: 16.1 % — ABNORMAL HIGH (ref 11.5–15.5)
WBC: 6.3 10*3/uL (ref 4.0–10.5)
nRBC: 0 % (ref 0.0–0.2)

## 2021-03-19 LAB — BASIC METABOLIC PANEL
Anion gap: 9 (ref 5–15)
BUN: 15 mg/dL (ref 8–23)
CO2: 24 mmol/L (ref 22–32)
Calcium: 8.7 mg/dL — ABNORMAL LOW (ref 8.9–10.3)
Chloride: 99 mmol/L (ref 98–111)
Creatinine, Ser: 0.8 mg/dL (ref 0.44–1.00)
GFR, Estimated: >60 mL/min (ref >60)
Glucose, Bld: 92 mg/dL (ref 70–99)
Potassium: 4.1 mmol/L (ref 3.5–5.1)
Sodium: 132 mmol/L — ABNORMAL LOW (ref 135–145)

## 2021-03-19 LAB — MAGNESIUM: Magnesium: 1.9 mg/dL (ref 1.7–2.4)

## 2021-03-19 MED ORDER — ROSUVASTATIN CALCIUM 10 MG PO TABS: 10.0000 mg | ORAL_TABLET | Freq: Every day | ORAL | Status: DC

## 2021-03-19 MED ORDER — AMIODARONE HCL 200 MG PO TABS
200.0000 mg | ORAL_TABLET | Freq: Two times a day (BID) | ORAL | Status: DC
  Administered 2021-03-19 – 2021-04-03 (×30): 200 mg via ORAL
  Filled 2021-03-19 (×30): qty 1

## 2021-03-19 MED ORDER — SENNOSIDES-DOCUSATE SODIUM 8.6-50 MG PO TABS
1.0000 | ORAL_TABLET | Freq: Two times a day (BID) | ORAL | Status: DC
  Administered 2021-03-19 – 2021-03-22 (×6): 1 via ORAL
  Filled 2021-03-19 (×6): qty 1

## 2021-03-19 MED ORDER — COLLAGENASE 250 UNIT/GM EX OINT
TOPICAL_OINTMENT | Freq: Every day | CUTANEOUS | Status: DC
  Filled 2021-03-19 (×2): qty 30

## 2021-03-19 MED ORDER — LEVOTHYROXINE SODIUM 25 MCG PO TABS
68.5000 ug | ORAL_TABLET | ORAL | Status: DC
  Administered 2021-03-24 – 2021-03-31 (×2): 68.5 ug via ORAL
  Filled 2021-03-19 (×4): qty 1

## 2021-03-19 MED ORDER — PANTOPRAZOLE SODIUM 40 MG PO TBEC
40.0000 mg | DELAYED_RELEASE_TABLET | Freq: Every day | ORAL | Status: DC
  Administered 2021-03-20 – 2021-04-03 (×15): 40 mg via ORAL
  Filled 2021-03-19 (×15): qty 1

## 2021-03-19 MED ORDER — ACETAMINOPHEN 160 MG/5ML PO SOLN: 650.0000 mg | ORAL | Status: DC | PRN

## 2021-03-19 MED ORDER — COLLAGENASE 250 UNIT/GM EX OINT
TOPICAL_OINTMENT | Freq: Every day | CUTANEOUS | 0 refills | Status: DC
Start: 2021-03-20 — End: 2021-04-03

## 2021-03-19 MED ORDER — BLOOD PRESSURE CONTROL BOOK
Freq: Once | Status: AC
  Filled 2021-03-19: qty 1

## 2021-03-19 MED ORDER — LEVOTHYROXINE SODIUM 25 MCG PO TABS
137.0000 ug | ORAL_TABLET | ORAL | Status: DC
  Administered 2021-03-20 – 2021-04-03 (×12): 137 ug via ORAL
  Filled 2021-03-19 (×12): qty 1

## 2021-03-19 MED ORDER — PANTOPRAZOLE SODIUM 40 MG PO TBEC: 40.0000 mg | DELAYED_RELEASE_TABLET | Freq: Every day | ORAL | Status: DC

## 2021-03-19 MED ORDER — ACETAMINOPHEN 325 MG PO TABS
650.0000 mg | ORAL_TABLET | ORAL | Status: DC | PRN
  Administered 2021-03-26 – 2021-03-27 (×2): 650 mg via ORAL
  Filled 2021-03-19 (×3): qty 2

## 2021-03-19 MED ORDER — ACETAMINOPHEN 650 MG RE SUPP: 650.0000 mg | RECTAL | Status: DC | PRN

## 2021-03-19 NOTE — Progress Notes (Signed)
Physical Therapy Treatment Patient Details Name: Kayla Harrison MRN: [id number] DOB: [birth year] Today's Date: 03/19/2021    History of Present Illness Pt is a 75 y.o. female who presented 6/22 with AMS after being found on ground at home. Pt with nontraumatic ICH. CT head shows an acute intracranial hemorrhage that extends into the left lateral ventricle and left foramen of [place]ro into the third ventricle.  She has very mild dilation of her left lateral ventricle with about 5 mm of left-to-right shift. PMH: anemia, arthritis, breast cancer, brucellosis, chronic bil pleural effusions, endometriosis, hepatitis, HTN, hx of dysphagia, hypothyroidism, lung cancer, nephrolithiasis, non Hodgkin's lymphoma, a-fib, cardiac pacemaker, rotator cuff tear, CVA, SVC syndrome, and thyroid cancer.    PT Comments    Pt progressing slowly towards physical therapy goals. Cognition continues to be altered as pt does not remember any therapies seeing her Kayla Harrison, Kayla Harrison, or Kayla Harrison last week. She appeared suspicious of PT and declined attempting to progress mobility outside of transfer to the recliner. With consistent therapist, feel she will progress well from a functional standpoint, and feel more frequent activity will also aide in decreasing bilateral knee pain. Will continue to follow.     Follow Up Recommendations  Supervision/Assistance - 24 hour;CIR     Equipment Recommendations  Other (comment) (defer to next venue of care)    Recommendations for Other Services Rehab consult     Precautions / Restrictions Precautions Precautions: Fall Precaution Comments: L hip wound vac; SBP goal < 140 Restrictions Weight Bearing Restrictions: No    Mobility  Bed Mobility Overal bed mobility: Needs Assistance Bed Mobility: Supine to Sit     Supine to sit: HOB elevated;Min assist     General bed mobility comments: VC's for technique. Heavy use of rails required. Pt was able to transition to EOB with increased  time and cues to scoot R hip out until both feet are on the ground.    Transfers Overall transfer level: Needs assistance Equipment used: Rolling walker (2 wheeled) Transfers: Sit to/from [facility] Sit to Stand: Mod assist;From elevated surface Stand pivot transfers: Mod assist;From elevated surface       General transfer comment: Assist for transition to full standing (elevated bed height), as well as for transition from bed to chair. RW required as pt relying heavily on the walker for support.  Ambulation/Gait             General Gait Details: Pt declined ambulation this date as she does not recall ambulating with PT last week and does not feel she can do it.   Stairs             Wheelchair Mobility    Modified Rankin (Stroke Patients Only) Modified Rankin (Stroke Patients Only) Pre-Morbid Rankin Score: Slight disability Modified Rankin: Moderately severe disability     Balance Overall balance assessment: Needs assistance Sitting-balance support: No upper extremity supported;Feet supported Sitting balance-Leahy Scale: Fair Sitting balance - Comments: Static sitting EOB with supervision.   Standing balance support: Bilateral upper extremity supported;During functional activity Standing balance-Leahy Scale: Poor Standing balance comment: Reliant on bil UE support and physical assistance.                            Cognition Arousal/Alertness: Awake/alert Behavior During Therapy: Flat affect Overall Cognitive Status: Impaired/Different from baseline Area of Impairment: Attention;Following commands;Safety/judgement;Awareness;Problem solving  Orientation Level: Disoriented to;Situation;Time Current Attention Level: Focused Memory: Decreased short-term memory Following Commands: Follows one step commands consistently;Follows one step commands with increased time;Follows multi-step commands  inconsistently Safety/Judgement: Decreased awareness of deficits Awareness: Emergent Problem Solving: Slow processing;Difficulty sequencing;Requires verbal cues;Requires tactile cues General Comments: Pt reports she has not been out of the bed, and has not worked with therapies yet. Pt was reoriented to the fact that she worked with PT thursday and Friday last week, and pt saw OT on Saturday. She reports she does not remember it at all. Willing to work with PT however hesitant to walk.      Exercises      General Comments        Pertinent Vitals/Pain Pain Assessment: Faces Faces Pain Scale: Hurts little more Pain Location: bil knees Pain Descriptors / Indicators: Grimacing;Guarding;Moaning Pain Intervention(s): Limited activity within patient's tolerance;Monitored during session;Repositioned    Home Living                      Prior Function            PT Goals (current goals can now be found in the care plan section) Acute Rehab PT Goals Patient Stated Goal: Decrease knee pain PT Goal Formulation: With patient/family Time For Goal Achievement: 03/29/21 Potential to Achieve Goals: Good Progress towards PT goals: Progressing toward goals    Frequency    Min 4X/week      PT Plan Current plan remains appropriate    Co-evaluation              AM-PAC PT "6 Clicks" Mobility   Outcome Measure  Help needed turning from your back to your side while in a flat bed without using bedrails?: A Little Help needed moving from lying on your back to sitting on the side of a flat bed without using bedrails?: A Little Help needed moving to and from a bed to a chair (including a wheelchair)?: A Lot Help needed standing up from a chair using your arms (e.g., wheelchair or bedside chair)?: A Lot Help needed to walk in hospital room?: A Lot Help needed climbing 3-5 steps with a railing? : Total 6 Click Score: 13    End of Session Equipment Utilized During Treatment:  Gait belt Activity Tolerance: Patient tolerated treatment well Patient left: in chair;with call bell/phone within reach;with chair alarm set;with family/visitor present Nurse Communication: Mobility status PT Visit Diagnosis: Unsteadiness on feet (R26.81);Other abnormalities of gait and mobility (R26.89);Muscle weakness (generalized) (M62.81);Difficulty in walking, not elsewhere classified (R26.2);Other symptoms and signs involving the nervous system (R29.898);History of falling (Z91.81)     Time: 4627-0350 PT Time Calculation (min) (ACUTE ONLY): 28 min  Charges:  $Gait Training: 8-22 mins $Therapeutic Activity: 8-22 mins                     Rolinda Roan, PT, DPT Acute Rehabilitation Services Pager: [phone] Office: [phone]    Kayla Harrison 03/19/2021, 2:57 PM

## 2021-03-19 NOTE — Consult Note (Addendum)
[place] Nurse wound follow up Patient receiving care in [phone] Wound type: Surgical  Drainage (amount, consistency, odor) 100 cc of sanguinous drainage in canister Periwound: intact Dressing procedure/placement/frequency: One piece of black foam removed and one piece of black foam replaced, drape applied and immediate suction obtained at 125 mmHg Will contact Dr. Lucia Gaskins concerning follow up plan of care.   Update 11:50: Per  with Dr Lucia Gaskins, he is okay with discontinuing the wound vac and changing to moistened saline gauze in the wound. Will discontinue the vac on Wednesday 03/21/21 and start w/d dressing then.   Monitor the wound area(s) for worsening of condition such as: Signs/symptoms of infection, increase in size, development of or worsening of odor, development of pain, or increased pain at the affected locations.   Notify the medical team if any of these develop.  Thank you for the consult. [place] nurse will not follow at this time.   Please re-consult the [place] team if needed.  Cathlean Marseilles Tamala Julian, MSN, RN, [place], [place], [facility] Wound Treatment Associate Pager [phone]

## 2021-03-19 NOTE — Progress Notes (Signed)
Inpatient Rehabilitation Medication Review by a Pharmacist  A complete drug regimen review was completed for this patient to identify any potential clinically significant medication issues.  Clinically significant medication issues were identified:  yes   Type of Medication Issue Identified Description of Issue Urgent (address now) Non-Urgent (address on AM team rounds) Plan Plan Accepted by Provider? (Yes / No / Pending AM Rounds)  Drug Interaction(s) (clinically significant)       Duplicate Therapy       Allergy       No Medication Administration End Date       Incorrect Dose       Additional Drug Therapy Needed  Crestor not continued at admission Non-urgent Resume Crestor   Other         Name of provider notified for urgent issues identified:   Provider Method of Notification: n/a   For non-urgent medication issues to be resolved on team rounds tomorrow morning a [facility] Secure Chat Handoff was sent to: Anette Guarneri   Pharmacist comments:   Time spent performing this drug regimen review (minutes):  5 minutes   [place] 03/19/2021 9:27 PM

## 2021-03-19 NOTE — Progress Notes (Signed)
Inpatient Rehab Admissions Coordinator:   I have a bed on CIR for this Pt. Today. RN may call [phone] for report.   Clemens Catholic, [place], [place] Admissions Coordinator  [phone] ([place]) [phone] (office)

## 2021-03-19 NOTE — H&P (Signed)
Physical Medicine and Rehabilitation Admission H&P    Chief Complaint  Patient presents with   Weakness  : HPI: Kayla Harrison is a 76 year old right-handed female well-known to rehab services from admission 02/13/2021 to 02/23/2021 for debility secondary to thigh wound hematoma status post I&D 02/06/2021 with wound VAC placement and drain as well as history of thyroid cancer, hypothyroidism, CVA,NHL/SVC syndrome/lung cancer radiation therapy as well as chemotherapy 2018 followed by Dr.Gudena, pacemaker placement for atrial fibrillation maintained on Eliquis followed by Dr. Caryl Comes, obesity status post lap band surgery hypertension hyperlipidemia and recurrent falls.  Per chart review patient lives alone independent with assistive device 1 level apartment with level entry.  She does have good family support.  Presented 03/14/2021 with altered mental status found down after she reportedly slipped off the bed.  No loss of consciousness.  Admission chemistries urine drug screen negative, glucose 111, hemoglobin 10.6, SARS coronavirus negative, CK 41. Cranial CT scan showed acute intracranial hemorrhage extending from the region of the left caudothalamic groove into the left lateral ventricle and through the left foramen Monro into the third ventricle.  There was mild dilation of the left lateral ventricle approxi-5 mm left to right midline shift.  Findings indicate of of hypertensive in nature or secondary to rupture of a-comm aneurysm.  CT angiogram of head and neck no large vessel occlusion.  No significant stenosis of the neck.  No aneurysm or visible vascular malformation in the region of the patient's known hemorrhage.  Neurology consulted and her Eliquis was reversed and and discontinued due to [place] and currently awaiting plan for possibly beginning Lovenox.  Echocardiogram completed showing ejection fraction of 55 to 60% no wall motion abnormalities.  In regards to patient's chronic left thigh wound  status post wound VAC followed by Dr. Lucia Gaskins as well as the wound center clinic Dr. Heber [place] and currently being followed by [facility] while in the hospital.  Therapy evaluations completed due to patient decreased functional mobility altered mental status recommendations of inpatient rehab services and patient was admitted for a comprehensive rehab program.  Review of Systems  Constitutional:  Negative for chills and fever.  HENT:  Negative for hearing loss.   Eyes:  Negative for blurred vision and double vision.  Respiratory:  Negative for cough and shortness of breath.   Cardiovascular:  Positive for palpitations and leg swelling. Negative for chest pain.  Gastrointestinal:  Positive for constipation. Negative for heartburn, nausea and vomiting.  Genitourinary:  Negative for dysuria, flank pain and hematuria.  Musculoskeletal:  Positive for falls, joint pain and myalgias.  Skin:  Negative for rash.  Neurological:  Positive for weakness.  All other systems reviewed and are negative. Past Medical History:  Diagnosis Date   Anemia    Arthritis    osteoarthritis - knees and right shoulder   Blood transfusion without reported diagnosis    Breast cancer ([place])    Dr Margot Chimes, total thyroidectomy- 1999- for cancer   Brucellosis 1964   Chronic bilateral pleural effusions    Colon polyp    Dr Earlean Shawl   Complication of anesthesia    Ketamine produces LSD reaction, bright colored nightmarish experience    Dyslipidemia    Endometriosis    Fibroids    H/O pleural effusion    s/p thoracentesis w 3231ml withdrawn   Hepatitis    [place] ?hepatitis    History of dysphagia    due to radiation therapy  History of hiatal hernia    small noted on PET scan   Hypertension    Hypothyroidism    Lung cancer, lower lobe ([place]) 01/2017   radiation RX completed 03/04/17; will start chemo 6/27, pt unaware of lung cancer   Morbid obesity ([place])    Status post lap band surgery    Nephrolithiasis    Non Hodgkin's lymphoma ([place])    on chemotherapy   Persistent atrial fibrillation ([place])    a. s/p PVI 2008 b. s/p convergent ablation 7482 complicated by bradycardia requiring pacemaker implant   Personal history of radiation therapy    Presence of permanent cardiac pacemaker    Rotator cuff tear    Right   Stroke ([place])    2003- [place] x2   SVC syndrome    with lung mass and non hodgkins lymphoma   Thyroid cancer ([place]) 2000   Past Surgical History:  Procedure Laterality Date   ABDOMINAL HYSTERECTOMY  1983   afib ablation     a. 2008 PVI b. 2014 convergent ablation   APPENDECTOMY     BONE MARROW BIOPSY  02/21/2017   BREAST LUMPECTOMY Left 2010   [place]     2015- negative   CARDIOVERSION  10/09/2012   Procedure: CARDIOVERSION;  Surgeon: Minus Breeding, MD;  Location: [place];  Service: Cardiovascular;  Laterality: N/A;   CARDIOVERSION  10/09/2012   Procedure: CARDIOVERSION;  Surgeon: Minus Breeding, MD;  Location: [facility] ENDOSCOPY;  Service: Cardiovascular;  Laterality: N/A;  [name] gave the ok to add pt to the add on , but we must check to find out if the can add pt on at 1400 ([phone])   CARDIOVERSION N/A 11/20/2012   Procedure: CARDIOVERSION;  Surgeon: Fay Records, MD;  Location: [place];  Service: Cardiovascular;  Laterality: N/A;   CARDIOVERSION N/A 07/18/2017   Procedure: CARDIOVERSION;  Surgeon: Thayer Headings, MD;  Location: [place];  Service: Cardiovascular;  Laterality: N/A;   CARDIOVERSION N/A 10/03/2017   Procedure: CARDIOVERSION;  Surgeon: Sanda Klein, MD;  Location: [place];  Service: Cardiovascular;  Laterality: N/A;   CARDIOVERSION N/A 01/07/2018   Procedure: CARDIOVERSION;  Surgeon: Nahser, Wonda Cheng, MD;  Location: [facility] ENDOSCOPY;  Service: Cardiovascular;  Laterality: N/A;   CARDIOVERSION N/A 12/10/2019   Procedure: CARDIOVERSION;  Surgeon: Buford Dresser, MD;  Location: [facility] ENDOSCOPY;  Service:  Cardiovascular;  Laterality: N/A;   CARDIOVERSION N/A 03/09/2021   Procedure: CARDIOVERSION;  Surgeon: Pixie Casino, MD;  Location: [place];  Service: Cardiovascular;  Laterality: N/A;   CHOLECYSTECTOMY     COLONOSCOPY W/ POLYPECTOMY     Dr Earlean Shawl   CYSTOSCOPY N/A 02/06/2015   Procedure: CYSTOSCOPY;  Surgeon: Kathie Rhodes, MD;  [facility];  Service: Urology;  Laterality: N/A;   CYSTOSCOPY W/ RETROGRADES Left 11/17/2017   Procedure: CYSTOSCOPY WITH RETROGRADE /PYELOGRAM/;  Surgeon: Kathie Rhodes, MD;  Location[facility];  Service: Urology;  Laterality: Left;   CYSTOSCOPY WITH RETROGRADE PYELOGRAM, URETEROSCOPY AND STENT PLACEMENT Right 02/06/2015   Procedure: RETROGRADE PYELOGRAM, RIGHT URETEROSCOPY STENT PLACEMENT;  Surgeon: Kathie Rhodes, MD;  [facility];  Service: Urology;  Laterality: Right;   CYSTOSCOPY WITH RETROGRADE PYELOGRAM, URETEROSCOPY AND STENT PLACEMENT Right 03/07/2017   Procedure: CYSTOSCOPY WITH RIGHT RETROGRADE PYELOGRAM,RIGHT  URETEROSCOPYLASER LITHOTRIPSY  AND STENT PLACEMENT AND STONE BASKETRY;  Surgeon: Kathie Rhodes, MD;  Location: [place];  Service: Urology;  Laterality: Right;   EYE SURGERY  cataract surgery   fatty mass removal  1999   pubic area   HOLMIUM LASER APPLICATION N/A 0/35/5974   Procedure: HOLMIUM LASER APPLICATION;  Surgeon: Kathie Rhodes, MD;  [facility];  Service: Urology;  Laterality: N/A;   HOLMIUM LASER APPLICATION Right 1/63/8453   Procedure: HOLMIUM LASER APPLICATION;  Surgeon: Kathie Rhodes, MD;  Location: [facility];  Service: Urology;  Laterality: Right;   HOLMIUM LASER APPLICATION Left 6/46/8032   Procedure: HOLMIUM LASER APPLICATION;  Surgeon: Kathie Rhodes, MD;  [facility];  Service: Urology;  Laterality: Left;   I & D EXTREMITY Left 12/19/2020   Procedure: IRRIGATION AND DEBRIDEMENT OF LEFT HIP HEMATOMA WITH APPLICATION OF WOUND VAC;  Surgeon: Erle Crocker, MD;  Location:  [place];  Service: Orthopedics;  Laterality: Left;   I & D EXTREMITY Left 02/06/2021   Procedure: IRRIGATION AND DEBRIDEMENT DEEP ABCESS LEFT THIGH, SECONDARY CLOSURE OF WOUND DEHISCENCE;  Surgeon: Erle Crocker, MD;  Location: [place];  Service: Orthopedics;  Laterality: Left;   IR FLUORO GUIDE PORT INSERTION RIGHT  02/24/2017   IR NEPHROSTOMY PLACEMENT RIGHT  11/17/2017   IR PATIENT EVAL TECH 0-60 MINS  03/11/2017   IR REMOVAL TUN ACCESS W/ PORT W/O FL MOD SED  04/20/2018   IR US GUIDE VASC ACCESS RIGHT  02/24/2017   KNEE ARTHROSCOPY     bilateral   LAPAROSCOPIC GASTRIC BANDING  07/10/2010   LAPAROSCOPIC GASTRIC BANDING     Laparoscopic adjustable banding APS System with posterior hiatal hernia, 2 suture.   LAPAROTOMY     for ruptured ovary and ovarian artery    NEPHROLITHOTOMY Right 11/17/2017   Procedure: NEPHROLITHOTOMY PERCUTANEOUS;  Surgeon: Kathie Rhodes, MD;  [facility];  Service: Urology;  Laterality: Right;   PACEMAKER INSERTION  03/10/2013   MDT dual chamber PPM   POCKET REVISION N/A 12/08/2013   Procedure: POCKET REVISION;  Surgeon: Deboraha Sprang, MD;  Location: [facility] CATH LAB;  Service: Cardiovascular;  Laterality: N/A;   PORTA CATH INSERTION     REVERSE SHOULDER ARTHROPLASTY Right 05/14/2018   Procedure: RIGHT REVERSE SHOULDER ARTHROPLASTY;  Surgeon: Tania Ade, MD;  Location: [place];  Service: Orthopedics;  Laterality: Right;   REVERSE SHOULDER REPLACEMENT Right 05/14/2018   RIGHT HEART CATH N/A 07/21/2019   Procedure: RIGHT HEART CATH;  Surgeon: Larey Dresser, MD;  Location: [place] CV LAB;  Service: Cardiovascular;  Laterality: N/A;   TEE WITH CARDIOVERSION  09/22/2017   TEE WITHOUT CARDIOVERSION N/A 10/03/2017   Procedure: TRANSESOPHAGEAL ECHOCARDIOGRAM (TEE);  Surgeon: Sanda Klein, MD;  Location[facility]OSCOPY;  Service: Cardiovascular;  Laterality: N/A;   TEE WITHOUT CARDIOVERSION N/A 08/04/2019   Procedure: TRANSESOPHAGEAL ECHOCARDIOGRAM (TEE);  Surgeon:  Larey Dresser, MD;  Location: [facility] ENDOSCOPY;  Service: Cardiovascular;  Laterality: N/A;   TEE WITHOUT CARDIOVERSION N/A 12/10/2019   Procedure: TRANSESOPHAGEAL ECHOCARDIOGRAM (TEE);  Surgeon: Buford Dresser, MD;  Location: [facility] ENDOSCOPY;  Service: Cardiovascular;  Laterality: N/A;   TEE WITHOUT CARDIOVERSION N/A 03/09/2021   Procedure: TRANSESOPHAGEAL ECHOCARDIOGRAM (TEE);  Surgeon: Pixie Casino, MD;  Location: [place];  Service: Cardiovascular;  Laterality: N/A;   THYROIDECTOMY  1998   Dr Margot Chimes   TONSILLECTOMY     TOTAL KNEE ARTHROPLASTY  04/13/2012   Procedure: TOTAL KNEE ARTHROPLASTY;  Surgeon: Rudean Haskell, MD;  Location: [place];  Service: Orthopedics;  Laterality: Right;   VIDEO BRONCHOSCOPY WITH ENDOBRONCHIAL ULTRASOUND N/A 02/07/2017   Procedure: VIDEO BRONCHOSCOPY WITH ENDOBRONCHIAL  ULTRASOUND;  Surgeon: Marshell Garfinkel, MD;  Location: [place];  Service: Pulmonary;  Laterality: N/A;   Family History  Problem Relation Age of Onset   Heart disease Father 75       MI $R'@autopsy'wF$    Colon cancer Father        COLON   Heart attack Father    Other Mother        temporal arteritis    Diabetes Sister    Diabetes Brother    Diabetes Paternal Aunt    Diabetes Paternal Grandmother    Esophageal cancer Neg Hx    Inflammatory bowel disease Neg Hx    Liver disease Neg Hx    Pancreatic cancer Neg Hx    Stomach cancer Neg Hx    Social History:  reports that she has never smoked. She has never used smokeless tobacco. She reports that she does not drink alcohol and does not use drugs. Allergies:  Allergies  Allergen Reactions   Ranolazine     Balance issues   Rivaroxaban     Nose Bleed X 6 hrs ; packing in ER Nasal hemorrhage   Tape Rash and Other (See Comments)    SKIN IS THIN AND TEARS EASILY (also does not tolerate paper tape well)     Tikosyn [Dofetilide] Other (See Comments)    "Long QT wave"    Epinephrine Other (See Comments)    Oral anesthetic Dental form  only (liquid). Patient stated she will become "out of it" she can hear you but cannot respond in normal fashion. "not fully with it"   Ketamine Other (See Comments)    Hallucinations    Medications Prior to Admission  Medication Sig Dispense Refill   acetaminophen (TYLENOL) 500 MG tablet Take 1,000 mg by mouth every 6 (six) hours as needed for mild pain.     amiodarone (PACERONE) 200 MG tablet Take 1 tablet (200 mg total) by mouth in the morning and at bedtime.     Ampicillin-Sulbactam (UNASYN) 3 (2-1) g SOLR injection      apixaban (ELIQUIS) 5 MG TABS tablet Take 5 mg by mouth 2 (two) times daily.     Emollient (OINTMENT BASE EX) Apply 1 application topically daily as needed (changing of dressing on wound). Honey [place]uka Ointment Wound     furosemide (LASIX) 20 MG tablet Take 20 mg by mouth daily as needed for fluid or edema.      HYDROcodone-acetaminophen (NORCO/VICODIN) 5-325 MG tablet Take 1 tablet by mouth 2 (two) times daily as needed for severe pain. 14 tablet 0   levothyroxine (SYNTHROID) 137 MCG tablet Take 68.5-137 mcg by mouth See admin instructions. Taking 137 mcg tablet daily except on Saturday taking 1/2 tablet     nystatin (MYCOSTATIN/NYSTOP) powder Apply 1 application topically 3 (three) times daily as needed (rash/irritation (skin folds)). 60 g 0   rosuvastatin (CRESTOR) 10 MG tablet Take 10 mg by mouth 2 (two) times a week. Mondays & Thursdays     senna (SENOKOT) 8.6 MG TABS tablet Take 2 tablets (17.2 mg total) by mouth at bedtime. For constipation--adjust as needed. Purchase OTC (Patient taking differently: Take 2 tablets by mouth at bedtime as needed for mild constipation. For constipation--adjust as needed. Purchase OTC) 120 tablet 0    Drug Regimen Review Drug regimen was reviewed and remains appropriate with no significant issues identified  Home: Home Living Family/patient expects to be discharged to:: Private residence Living Arrangements: Alone Available Help at  Discharge: Family, Available  PRN/intermittently Type of Home: Apartment Home Access: Level entry Home Layout: One level Bathroom Shower/Tub: Multimedia programmer: Standard Bathroom Accessibility: Yes Home Equipment: Environmental consultant - 4 wheels, Shower seat - built in, [place] - single point, Grab bars - tub/shower Additional Comments: Pt has cat named "Callie".  Lives With: Alone   Functional History: Prior Function Level of Independence: Independent with assistive device(s) Comments: mod I with rollator.  States she was able to care for meds, meals and basic home management.  Has not driven in a few months.  Functional Status:  Mobility: Bed Mobility Overal bed mobility: Needs Assistance Bed Mobility: Supine to Sit Supine to sit: HOB elevated, Min assist General bed mobility comments: Cued pt to reach for L bed rail to assist with trunk ascension, minA to control legs descending off EOB as they would hurt with a quick descent. Transfers Overall transfer level: Needs assistance Equipment used: Rolling walker (2 wheeled) Transfers: Sit to/from Stand, [employer] Transfers Sit to Stand: Mod assist, From elevated surface Stand pivot transfers: Mod assist, From elevated surface General transfer comment: Sit to stand 1x from EOB and 1x from recliner, cuing for hand position and to rock to gain momentum, modA to power up, steady, and obtain upright posture and reduce posterior lean. ModA to stand step to L bed > recliner with RW, cuing for feet placement and RW management. Ambulation/Gait Ambulation/Gait assistance: Mod assist Gait Distance (Feet): 8 Feet (x2 bouts of ~3 ft > ~8 ft) Assistive device: Rolling walker (2 wheeled) Gait Pattern/deviations: Decreased stride length, Shuffle, Trunk flexed, Leaning posteriorly, Step-through pattern, Decreased stance time - right General Gait Details: Pt with trunk flexed and posterior lean, tactile and verbal cues to correct, mod success. Practiced  lateral weight shifting and marching prior to stepping anteriorly. Improved weight shifting and leg advancement bil. Noted decreased stride length and bil feet clearance, needing visual cues to improve through cuing pt to step on PT's feet anterior to her, success noted. [place] for stability and guidance. Gait velocity: decreased Gait velocity interpretation: <1.31 ft/sec, indicative of household ambulator    ADL: ADL Overall ADL's : Needs assistance/impaired Upper Body Dressing : Minimal assistance, Sitting Lower Body Dressing: Moderate assistance, Sit to/from stand Functional mobility during ADLs: Cueing for safety, Cueing for sequencing, Rolling walker, Moderate assistance  Cognition: Cognition Overall Cognitive Status: Impaired/Different from baseline Arousal/Alertness: Awake/alert Orientation Level: Oriented to person, Oriented to place, Disoriented to time, Disoriented to situation Attention: Selective Selective Attention: Impaired Selective Attention Impairment: Verbal basic, Functional basic Memory: Impaired Memory Impairment: Retrieval deficit, Storage deficit, Decreased recall of new information Awareness: Impaired Awareness Impairment: Anticipatory impairment, Intellectual impairment (some emergent awareness of what she is having trouble with) Problem Solving: Impaired Problem Solving Impairment: Verbal basic, Functional basic Safety/Judgment: Impaired Cognition Arousal/Alertness: Awake/alert Behavior During Therapy: Flat affect Overall Cognitive Status: Impaired/Different from baseline Area of Impairment: Attention, Following commands, Safety/judgement, Awareness, Problem solving Orientation Level: Disoriented to, Situation Current Attention Level: Focused Memory: Decreased short-term memory Following Commands: Follows one step commands consistently, Follows one step commands with increased time, Follows multi-step commands inconsistently Safety/Judgement: Decreased  awareness of deficits Awareness: Emergent Problem Solving: Slow processing, Difficulty sequencing, Requires verbal cues, Requires tactile cues General Comments: Pt slow to process cues, needing repeated simple multi-modal cues to sequence all tasks. Pt with flat affect and appeared anxious with standing mobility. Needs reminders for proper posture and balance.  Physical Exam: Blood pressure (P) 137/69, pulse (P) 70, temperature (P) 97.8 F (36.6  C), temperature source (P) Oral, resp. rate (P) 18, height $RemoveBe'5\' 6"'DbuqWWuym$  (1.676 m), weight 90.2 kg, SpO2 (P) 97 %. Physical Exam Constitutional:      Appearance: She is obese. She is ill-appearing.  HENT:     Head: Atraumatic.     Right Ear: External ear normal.     Left Ear: External ear normal.     Nose: Nose normal.  Eyes:     Extraocular Movements: Extraocular movements intact.     Pupils: Pupils are equal, round, and reactive to light.  Cardiovascular:     Rate and Rhythm: Normal rate. Rhythm irregular.     Pulses: Normal pulses.  Pulmonary:     Effort: No respiratory distress.     Breath sounds: No wheezing.  Abdominal:     General: Bowel sounds are normal.     Palpations: Abdomen is soft.  Musculoskeletal:        General: Swelling (left hip) present.  Skin:    Comments: Santyl and dressing in place to left thigh wound. Surrounding bruising, edema  Neurological:     Comments: Patient is a bit lethargic but arousable.  She provides her name, age as well as date of birth with some delay in processing and self correcting.  She does follow simple commands.  She cannot recall her latest stay while in CIR.  Very limited medical historian currently. UE's 4/5 prox to distal. LE's 2-3/5 prox to 4/5 distally. Decreased [place] with FTN without ataxia.   Psychiatric:     Comments: Flat, generally cooperative    Results for orders placed or performed during the hospital encounter of 03/14/21 (from the past 48 hour(s))  CBC     Status: Abnormal    Collection Time: 03/19/21  2:46 AM  Result Value Ref Range   WBC 6.3 4.0 - 10.5 K/uL   RBC 3.45 (L) 3.87 - 5.11 MIL/uL   Hemoglobin 9.2 (L) 12.0 - 15.0 g/dL   HCT 30.3 (L) 36.0 - 46.0 %   MCV 87.8 80.0 - 100.0 fL   MCH 26.7 26.0 - 34.0 pg   MCHC 30.4 30.0 - 36.0 g/dL   RDW 16.1 (H) 11.5 - 15.5 %   Platelets 234 150 - 400 K/uL   nRBC 0.0 0.0 - 0.2 %    Comment: Performed at [facility], [place] [address] [place], [place] [address]  Basic metabolic panel     Status: Abnormal   Collection Time: 03/19/21  2:46 AM  Result Value Ref Range   Sodium 132 (L) 135 - 145 mmol/L   Potassium 4.1 3.5 - 5.1 mmol/L   Chloride 99 98 - 111 mmol/L   CO2 24 22 - 32 mmol/L   Glucose, Bld 92 70 - 99 mg/dL    Comment: Glucose reference range applies only to samples taken after fasting for at least 8 hours.   BUN 15 8 - 23 mg/dL   Creatinine, Ser 0.80 0.44 - 1.00 mg/dL   Calcium 8.7 (L) 8.9 - 10.3 mg/dL   GFR, Estimated >60 >60 mL/min    Comment: (NOTE) Calculated using the CKD-EPI Creatinine Equation (2021)    Anion gap 9 5 - 15    Comment: Performed at [place] [address] [place], [place] [address]  Magnesium     Status: None   Collection Time: 03/19/21  2:46 AM  Result Value Ref Range   Magnesium 1.9 1.7 - 2.4 mg/dL    Comment: Performed at  [facility], [place] [address], [place], [place] [address]   *Note: Due to a large number of results and/or encounters for the requested time period, some results have not been displayed. A complete set of results can be found in Results Review.   No results found.     Medical Problem List and Plan: 1.  Altered mental status secondary to small left caudate head ICH with small IVH secondary hypertension and anticoagulation use  -patient may not yet shower while vac on  -ELOS/Goals: 14-16 days, min assist to supervision goals with PT, OT and mod I to supervision with SLP 2.  Antithrombotics: -DVT/anticoagulation:  SCDs  -antiplatelet therapy: N/A 3. Pain Management: Tylenol as needed 4. Mood: Provide emotional support  -antipsychotic agents: N/A 5. Neuropsych: This patient is not capable of making decisions on her own behalf. 7. Fluids/Electrolytes/Nutrition: Routine in and outs with follow-up chemistries 8.  Hypertension.  Lisinopril 10 mg daily.  Monitor with increased mobility 9.  Atrial fibrillation/pacemaker.  Amiodarone 200 mg twice daily.   Eliquis discontinued due to [place] 10.  Hypothyroidism.  Synthroid 11.  Chronic left thigh wound.  Followed by Dr. Lucia Gaskins.  WOC following while in the hospital.  Dressing changes as directed.   -Wound VAC to be discontinued 03/21/2021 12.  NHL with SVC syndrome/lung cancer radiation therapy as well as chemo.  Followed by Dr.Gudena 13.  Obesity.  Status post lap band surgery.  BMI 32.10.  Dietary follow-up 14.  Acute on chronic anemia.  Follow-up CBC on admit labs    Cathlyn Parsons, PA-C 03/19/2021

## 2021-03-19 NOTE — Consult Note (Signed)
WOC Nurse Consult Note: Patient transferred from [phone] to [phone] Continue previous orders for the left inner upper thigh wound as follows: Apply Santyl to the left inner thigh wound in a nickel thick layer. Cover with a saline moistened gauze, then dry gauze and foam dressing.  Change daily. This wound will be re-assessed on Wednesday.  Please see note from earlier today for NPWT. These orders will change Wednesday and placed in the chart at that time.   Monitor the wound area(s) for worsening of condition such as: Signs/symptoms of infection, increase in size, development of or worsening of odor, development of pain, or increased pain at the affected locations.   Notify the medical team if any of these develop.  Thank you for the consult. [place] nurse will follow-up on Wednesday 03/21/21  Please re-consult the [place] team if needed.  Cathlean Marseilles Tamala Julian, MSN, RN, [place], [facility], [facility] [address] Wound Treatment Associate Pager [phone]

## 2021-03-19 NOTE — H&P (Signed)
Physical Medicine and Rehabilitation Admission H&P        Chief Complaint  Patient presents with   Weakness  : HPI: Kayla Harrison is a 75 year old right-handed female well-known to rehab services from admission 02/13/2021 to 02/23/2021 for debility secondary to thigh wound hematoma status post I&D 02/06/2021 with wound VAC placement and drain as well as history of thyroid cancer, hypothyroidism, CVA,NHL/SVC syndrome/lung cancer radiation therapy as well as chemotherapy 2018 followed by Dr.Gudena, pacemaker placement for atrial fibrillation maintained on Eliquis followed by Dr. Graciela Husbands, obesity status post lap band surgery hypertension hyperlipidemia and recurrent falls.  Per chart review patient lives alone independent with assistive device 1 level apartment with level entry.  She does have good family support.  Presented 03/14/2021 with altered mental status found down after she reportedly slipped off the bed.  No loss of consciousness.  Admission chemistries urine drug screen negative, glucose 111, hemoglobin 10.6, SARS coronavirus negative, CK 41. Cranial CT scan showed acute intracranial hemorrhage extending from the region of the left caudothalamic groove into the left lateral ventricle and through the left foramen Monro into the third ventricle.  There was mild dilation of the left lateral ventricle approxi-5 mm left to right midline shift.  Findings indicate of of hypertensive in nature or secondary to rupture of a-comm aneurysm.  CT angiogram of head and neck no large vessel occlusion.  No significant stenosis of the neck.  No aneurysm or visible vascular malformation in the region of the patient's known hemorrhage.  Neurology consulted and her Eliquis was reversed and and discontinued due to ICH and currently awaiting plan for possibly beginning Lovenox.  Echocardiogram completed showing ejection fraction of 55 to 60% no wall motion abnormalities.  In regards to patient's chronic left thigh  wound status post wound VAC followed by Dr. Susa Simmonds as well as the wound center clinic Dr. Mikey Bussing and currently being followed by [facility] while in the hospital.  Therapy evaluations completed due to patient decreased functional mobility altered mental status recommendations of inpatient rehab services and patient was admitted for a comprehensive rehab program.   Review of Systems Constitutional:  Negative for chills and fever. HENT:  Negative for hearing loss.   Eyes:  Negative for blurred vision and double vision. Respiratory:  Negative for cough and shortness of breath.   Cardiovascular:  Positive for palpitations and leg swelling. Negative for chest pain. Gastrointestinal:  Positive for constipation. Negative for heartburn, nausea and vomiting. Genitourinary:  Negative for dysuria, flank pain and hematuria. Musculoskeletal:  Positive for falls, joint pain and myalgias. Skin:  Negative for rash. Neurological:  Positive for weakness.  All other systems reviewed and are negative.     Past Medical History:  Diagnosis Date   Anemia     Arthritis      osteoarthritis - knees and right shoulder   Blood transfusion without reported diagnosis     Breast cancer (HCC)      Dr Jamey Ripa, total thyroidectomy- 1999- for cancer   Brucellosis 1964   Chronic bilateral pleural effusions     Colon polyp      Dr Kinnie Scales   Complication of anesthesia      Ketamine produces LSD reaction, bright colored nightmarish experience   Dyslipidemia     Endometriosis     Fibroids     H/O pleural effusion      s/p thoracentesis w withdrawn   Hepatitis      [place] ?hepatitis   History of dysphagia      due to radiation therapy   History of hiatal hernia      small noted on PET scan   Hypertension     Hypothyroidism     Lung cancer, lower lobe (HCC) 01/2017    radiation RX completed 03/04/17; will start chemo 6/27, pt unaware of lung cancer   Morbid obesity (HCC)       Status post lap band surgery   Nephrolithiasis     Non Hodgkin's lymphoma (HCC)      on chemotherapy   Persistent atrial fibrillation (HCC)      a. s/p PVI 2008 b. s/p convergent ablation 2014 complicated by bradycardia requiring pacemaker implant   Personal history of radiation therapy     Presence of permanent cardiac pacemaker     Rotator cuff tear      Right   Stroke (HCC)      2003- [place] x2   SVC syndrome      with lung mass and non hodgkins lymphoma   Thyroid cancer (HCC) 2000         Past Surgical History:  Procedure Laterality Date   ABDOMINAL HYSTERECTOMY   1983   afib ablation        a. 2008 PVI b. 2014 convergent ablation   APPENDECTOMY       BONE MARROW BIOPSY   02/21/2017   BREAST LUMPECTOMY Left 2010   bso   1998   CARDIAC CATHETERIZATION        2015- negative   CARDIOVERSION   10/09/2012    Procedure: CARDIOVERSION;  Surgeon: Rollene Rotunda, MD;  Location: [facility] OR;  Service: Cardiovascular;  Laterality: N/A;   CARDIOVERSION   10/09/2012    Procedure: CARDIOVERSION;  Surgeon: Rollene Rotunda, MD;  Location: [facility] ENDOSCOPY;  Service: Cardiovascular;  Laterality: N/A;  Kathlene November gave the ok to add pt to the add on , but we must check to find out if the can add pt on at 1400 [phone])   CARDIOVERSION N/A 11/20/2012    Procedure: CARDIOVERSION;  Surgeon: Pricilla Riffle, MD;  Location: [facility] ENDOSCOPY;  Service: Cardiovascular;  Laterality: N/A;   CARDIOVERSION N/A 07/18/2017    Procedure: CARDIOVERSION;  Surgeon: Vesta Mixer, MD;  Location: [facility] ENDOSCOPY;  Service: Cardiovascular;  Laterality: N/A;   CARDIOVERSION N/A 10/03/2017    Procedure: CARDIOVERSION;  Surgeon: Thurmon Fair, MD;  Location: [facility] ENDOSCOPY;  Service: Cardiovascular;  Laterality: N/A;   CARDIOVERSION N/A 01/07/2018    Procedure: CARDIOVERSION;  Surgeon: Nahser, Deloris Ping, MD;  Location: [facility] ENDOSCOPY;  Service: Cardiovascular;  Laterality: N/A;   CARDIOVERSION N/A 12/10/2019    Procedure: CARDIOVERSION;   Surgeon: Jodelle Red, MD;  Location: [facility] ENDOSCOPY;  Service: Cardiovascular;  Laterality: N/A;   CARDIOVERSION N/A 03/09/2021    Procedure: CARDIOVERSION;  Surgeon: Chrystie Nose, MD;  Location: [facility] ENDOSCOPY;  Service: Cardiovascular;  Laterality: N/A;   CHOLECYSTECTOMY       COLONOSCOPY W/ POLYPECTOMY        Dr Kinnie Scales   CYSTOSCOPY N/A 02/06/2015    Procedure: CYSTOSCOPY;  Surgeon: Ihor Gully, MD;  Location: [facility] ORS;  Service: Urology;  Laterality: N/A;   CYSTOSCOPY W/ RETROGRADES Left 11/17/2017    Procedure: CYSTOSCOPY WITH RETROGRADE /PYELOGRAM/;  Surgeon: Ihor Gully, MD;  Location: [facility] ORS;  Service: Urology;  Laterality: Left;   CYSTOSCOPY WITH RETROGRADE PYELOGRAM, URETEROSCOPY AND STENT PLACEMENT Right 02/06/2015  Procedure: RETROGRADE PYELOGRAM, RIGHT URETEROSCOPY STENT PLACEMENT;  Surgeon: Kathie Rhodes, MD;  Location: [facility] ORS;  Service: Urology;  Laterality: Right;   CYSTOSCOPY WITH RETROGRADE PYELOGRAM, URETEROSCOPY AND STENT PLACEMENT Right 03/07/2017    Procedure: CYSTOSCOPY WITH RIGHT RETROGRADE PYELOGRAM,RIGHT  URETEROSCOPYLASER LITHOTRIPSY  AND STENT PLACEMENT AND STONE BASKETRY;  Surgeon: Kathie Rhodes, MD;  Location: [place];  Service: Urology;  Laterality: Right;   EYE SURGERY        cataract surgery   fatty mass removal   1999    pubic area   HOLMIUM LASER APPLICATION N/A 6/97/9480    Procedure: HOLMIUM LASER APPLICATION;  Surgeon: Kathie Rhodes, MD;  Location: [facility] ORS;  Service: Urology;  Laterality: N/A;   HOLMIUM LASER APPLICATION Right 1/65/5374    Procedure: HOLMIUM LASER APPLICATION;  Surgeon: Kathie Rhodes, MD;  Location: [facility];  Service: Urology;  Laterality: Right;   HOLMIUM LASER APPLICATION Left 05/19/785    Procedure: HOLMIUM LASER APPLICATION;  Surgeon: Kathie Rhodes, MD;  Location: [facility] ORS;  Service: Urology;  Laterality: Left;   I & D EXTREMITY Left 12/19/2020    Procedure: IRRIGATION AND DEBRIDEMENT OF LEFT  HIP HEMATOMA WITH APPLICATION OF WOUND VAC;  Surgeon: Erle Crocker, MD;  Location: [place];  Service: Orthopedics;  Laterality: Left;   I & D EXTREMITY Left 02/06/2021    Procedure: IRRIGATION AND DEBRIDEMENT DEEP ABCESS LEFT THIGH, SECONDARY CLOSURE OF WOUND DEHISCENCE;  Surgeon: Erle Crocker, MD;  Location: [place];  Service: Orthopedics;  Laterality: Left;   IR FLUORO GUIDE PORT INSERTION RIGHT   02/24/2017   IR NEPHROSTOMY PLACEMENT RIGHT   11/17/2017   IR PATIENT EVAL TECH 0-60 MINS   03/11/2017   IR REMOVAL TUN ACCESS W/ PORT W/O FL MOD SED   04/20/2018   IR US GUIDE VASC ACCESS RIGHT   02/24/2017   KNEE ARTHROSCOPY        bilateral   LAPAROSCOPIC GASTRIC BANDING   07/10/2010   LAPAROSCOPIC GASTRIC BANDING        Laparoscopic adjustable banding APS System with posterior hiatal hernia, 2 suture.   LAPAROTOMY        for ruptured ovary and ovarian artery   NEPHROLITHOTOMY Right 11/17/2017    Procedure: NEPHROLITHOTOMY PERCUTANEOUS;  Surgeon: Kathie Rhodes, MD;  Location: [facility] ORS;  Service: Urology;  Laterality: Right;   PACEMAKER INSERTION   03/10/2013    MDT dual chamber PPM   POCKET REVISION N/A 12/08/2013    Procedure: POCKET REVISION;  Surgeon: Deboraha Sprang, MD;  Location: [facility] CATH LAB;  Service: Cardiovascular;  Laterality: N/A;   PORTA CATH INSERTION       REVERSE SHOULDER ARTHROPLASTY Right 05/14/2018    Procedure: RIGHT REVERSE SHOULDER ARTHROPLASTY;  Surgeon: Tania Ade, MD;  Location: [place];  Service: Orthopedics;  Laterality: Right;   REVERSE SHOULDER REPLACEMENT Right 05/14/2018   RIGHT HEART CATH N/A 07/21/2019    Procedure: RIGHT HEART CATH;  Surgeon: Larey Dresser, MD;  Location: [place] CV LAB;  Service: Cardiovascular;  Laterality: N/A;   TEE WITH CARDIOVERSION   09/22/2017   TEE WITHOUT CARDIOVERSION N/A 10/03/2017    Procedure: TRANSESOPHAGEAL ECHOCARDIOGRAM (TEE);  Surgeon: Sanda Klein, MD;  Location: [facility] ENDOSCOPY;  Service: Cardiovascular;   Laterality: N/A;   TEE WITHOUT CARDIOVERSION N/A 08/04/2019    Procedure: TRANSESOPHAGEAL ECHOCARDIOGRAM (TEE);  Surgeon: Larey Dresser, MD;  Location: [facility] ENDOSCOPY;  Service: Cardiovascular;  Laterality: N/A;   TEE  WITHOUT CARDIOVERSION N/A 12/10/2019    Procedure: TRANSESOPHAGEAL ECHOCARDIOGRAM (TEE);  Surgeon: Buford Dresser, MD;  Location: [facility] ENDOSCOPY;  Service: Cardiovascular;  Laterality: N/A;   TEE WITHOUT CARDIOVERSION N/A 03/09/2021    Procedure: TRANSESOPHAGEAL ECHOCARDIOGRAM (TEE);  Surgeon: Pixie Casino, MD;  Location: [place];  Service: Cardiovascular;  Laterality: N/A;   THYROIDECTOMY   1998    Dr Margot Chimes   TONSILLECTOMY       TOTAL KNEE ARTHROPLASTY   04/13/2012    Procedure: TOTAL KNEE ARTHROPLASTY;  Surgeon: Rudean Haskell, MD;  Location: [place];  Service: Orthopedics;  Laterality: Right;   VIDEO BRONCHOSCOPY WITH ENDOBRONCHIAL ULTRASOUND N/A 02/07/2017    Procedure: VIDEO BRONCHOSCOPY WITH ENDOBRONCHIAL ULTRASOUND;  Surgeon: Marshell Garfinkel, MD;  Location: [place];  Service: Pulmonary;  Laterality: N/A;         Family History  Problem Relation Age of Onset   Heart disease Father 69        MI $R'@autopsy'XF$    Colon cancer Father          COLON   Heart attack Father     Other Mother          temporal arteritis   Diabetes Sister     Diabetes Brother     Diabetes Paternal Aunt     Diabetes Paternal Grandmother     Esophageal cancer Neg Hx     Inflammatory bowel disease Neg Hx     Liver disease Neg Hx     Pancreatic cancer Neg Hx     Stomach cancer Neg Hx      Social History:  reports that she has never smoked. She has never used smokeless tobacco. She reports that she does not drink alcohol and does not use drugs. Allergies:       Allergies  Allergen Reactions   Ranolazine        Balance issues   Rivaroxaban        Nose Bleed X 6 hrs ; packing in ER Nasal hemorrhage   Tape Rash and Other (See Comments)      SKIN IS THIN AND TEARS EASILY (also does  not tolerate paper tape well)     Tikosyn [Dofetilide] Other (See Comments)      "Long QT wave"     Epinephrine Other (See Comments)      Oral anesthetic Dental form only (liquid). Patient stated she will become "out of it" she can hear you but cannot respond in normal fashion. "not fully with it"   Ketamine Other (See Comments)      Hallucinations            Medications Prior to Admission  Medication Sig Dispense Refill   acetaminophen (TYLENOL) 500 MG tablet Take 1,000 mg by mouth every 6 (six) hours as needed for mild pain.       amiodarone (PACERONE) 200 MG tablet Take 1 tablet (200 mg total) by mouth in the morning and at bedtime.       Ampicillin-Sulbactam (UNASYN) 3 (2-1) g SOLR injection         apixaban (ELIQUIS) 5 MG TABS tablet Take 5 mg by mouth 2 (two) times daily.       Emollient (OINTMENT BASE EX) Apply 1 application topically daily as needed (changing of dressing on wound). Honey [place]uka Ointment Wound       furosemide (LASIX) 20 MG tablet Take 20 mg by mouth daily as needed for fluid or edema.  HYDROcodone-acetaminophen (NORCO/VICODIN) 5-325 MG tablet Take 1 tablet by mouth 2 (two) times daily as needed for severe pain. 14 tablet 0   levothyroxine (SYNTHROID) 137 MCG tablet Take 68.5-137 mcg by mouth See admin instructions. Taking 137 mcg tablet daily except on Saturday taking 1/2 tablet       nystatin (MYCOSTATIN/NYSTOP) powder Apply 1 application topically 3 (three) times daily as needed (rash/irritation (skin folds)). 60 g 0   rosuvastatin (CRESTOR) 10 MG tablet Take 10 mg by mouth 2 (two) times a week. Mondays & Thursdays       senna (SENOKOT) 8.6 MG TABS tablet Take 2 tablets (17.2 mg total) by mouth at bedtime. For constipation--adjust as needed. Purchase OTC (Patient taking differently: Take 2 tablets by mouth at bedtime as needed for mild constipation. For constipation--adjust as needed. Purchase OTC) 120 tablet 0      Drug Regimen Review Drug regimen was  reviewed and remains appropriate with no significant issues identified   Home: Home Living Family/patient expects to be discharged to:: Private residence Living Arrangements: Alone Available Help at Discharge: Family, Available PRN/intermittently Type of Home: Apartment Home Access: Level entry Home Layout: One level Bathroom Shower/Tub: Multimedia programmer: Standard Bathroom Accessibility: Yes Home Equipment: Environmental consultant - 4 wheels, Shower seat - built in, [place] - single point, Grab bars - tub/shower Additional Comments: Pt has cat named "Callie".  Lives With: Alone   Functional History: Prior Function Level of Independence: Independent with assistive device(s) Comments: mod I with rollator.  States she was able to care for meds, meals and basic home management.  Has not driven in a few months.   Functional Status:  Mobility: Bed Mobility Overal bed mobility: Needs Assistance Bed Mobility: Supine to Sit Supine to sit: HOB elevated, Min assist General bed mobility comments: Cued pt to reach for L bed rail to assist with trunk ascension, minA to control legs descending off EOB as they would hurt with a quick descent. Transfers Overall transfer level: Needs assistance Equipment used: Rolling walker (2 wheeled) Transfers: Sit to/from Stand, [employer] Transfers Sit to Stand: Mod assist, From elevated surface Stand pivot transfers: Mod assist, From elevated surface General transfer comment: Sit to stand 1x from EOB and 1x from recliner, cuing for hand position and to rock to gain momentum, modA to power up, steady, and obtain upright posture and reduce posterior lean. ModA to stand step to L bed > recliner with RW, cuing for feet placement and RW management. Ambulation/Gait Ambulation/Gait assistance: Mod assist Gait Distance (Feet): 8 Feet (x2 bouts of ~3 ft > ~8 ft) Assistive device: Rolling walker (2 wheeled) Gait Pattern/deviations: Decreased stride length, Shuffle,  Trunk flexed, Leaning posteriorly, Step-through pattern, Decreased stance time - right General Gait Details: Pt with trunk flexed and posterior lean, tactile and verbal cues to correct, mod success. Practiced lateral weight shifting and marching prior to stepping anteriorly. Improved weight shifting and leg advancement bil. Noted decreased stride length and bil feet clearance, needing visual cues to improve through cuing pt to step on PT's feet anterior to her, success noted. [place] for stability and guidance. Gait velocity: decreased Gait velocity interpretation: <1.31 ft/sec, indicative of household ambulator   ADL: ADL Overall ADL's : Needs assistance/impaired Upper Body Dressing : Minimal assistance, Sitting Lower Body Dressing: Moderate assistance, Sit to/from stand Functional mobility during ADLs: Cueing for safety, Cueing for sequencing, Rolling walker, Moderate assistance   Cognition: Cognition Overall Cognitive Status: Impaired/Different from baseline Arousal/Alertness: Awake/alert Orientation Level: Oriented  to person, Oriented to place, Disoriented to time, Disoriented to situation Attention: Selective Selective Attention: Impaired Selective Attention Impairment: Verbal basic, Functional basic Memory: Impaired Memory Impairment: Retrieval deficit, Storage deficit, Decreased recall of new information Awareness: Impaired Awareness Impairment: Anticipatory impairment, Intellectual impairment (some emergent awareness of what she is having trouble with) Problem Solving: Impaired Problem Solving Impairment: Verbal basic, Functional basic Safety/Judgment: Impaired Cognition Arousal/Alertness: Awake/alert Behavior During Therapy: Flat affect Overall Cognitive Status: Impaired/Different from baseline Area of Impairment: Attention, Following commands, Safety/judgement, Awareness, Problem solving Orientation Level: Disoriented to, Situation Current Attention Level: Focused Memory:  Decreased short-term memory Following Commands: Follows one step commands consistently, Follows one step commands with increased time, Follows multi-step commands inconsistently Safety/Judgement: Decreased awareness of deficits Awareness: Emergent Problem Solving: Slow processing, Difficulty sequencing, Requires verbal cues, Requires tactile cues General Comments: Pt slow to process cues, needing repeated simple multi-modal cues to sequence all tasks. Pt with flat affect and appeared anxious with standing mobility. Needs reminders for proper posture and balance.   Physical Exam: Blood pressure (P) 137/69, pulse (P) 70, temperature (P) 97.8 F (36.6 C), temperature source (P) Oral, resp. rate (P) 18, height $RemoveBe'5\' 6"'[id number]$  (1.676 m), weight 90.2 kg, SpO2 (P) 97 %. Physical Exam Constitutional:      Appearance: She is obese. She is ill-appearing. HENT:    Head: Atraumatic.    Right Ear: External ear normal.    Left Ear: External ear normal.    Nose: Nose normal. Eyes:    Extraocular Movements: Extraocular movements intact.    Pupils: Pupils are equal, round, and reactive to light. Cardiovascular:    Rate and Rhythm: Normal rate. Rhythm irregular.    Pulses: Normal pulses. Pulmonary:    Effort: No respiratory distress.    Breath sounds: No wheezing. Abdominal:    General: Bowel sounds are normal.    Palpations: Abdomen is soft. Musculoskeletal:        General: Swelling (left hip) present. Skin:    Comments: Santyl and dressing in place to left thigh wound. Surrounding bruising, edema  Neurological:    Comments: Patient is a bit lethargic but arousable.  She provides her name, age as well as date of birth with some delay in processing and self correcting.  She does follow simple commands.  She cannot recall her latest stay while in CIR.  Very limited medical historian currently. UE's 4/5 prox to distal. LE's 2-3/5 prox to 4/5 distally. Decreased [place] with FTN without ataxia.   Psychiatric:     Comments: Flat, generally cooperative      Lab Results Last 48 Hours        Results for orders placed or performed during the hospital encounter of 03/14/21 (from the past 48 hour(s))  CBC     Status: Abnormal    Collection Time: 03/19/21  2:46 AM  Result Value Ref Range    WBC 6.3 4.0 - 10.5 K/uL    RBC 3.45 (L) 3.87 - 5.11 MIL/uL    Hemoglobin 9.2 (L) 12.0 - 15.0 g/dL    HCT 30.3 (L) 36.0 - 46.0 %    MCV 87.8 80.0 - 100.0 fL    MCH 26.7 26.0 - 34.0 pg    MCHC 30.4 30.0 - 36.0 g/dL    RDW 16.1 (H) 11.5 - 15.5 %    Platelets 234 150 - 400 K/uL    nRBC 0.0 0.0 - 0.2 %      Comment: Performed at [facility], [address] [place], [place] [address]  Basic metabolic panel     Status: Abnormal    Collection Time: 03/19/21  2:46 AM  Result Value Ref Range    Sodium 132 (L) 135 - 145 mmol/L    Potassium 4.1 3.5 - 5.1 mmol/L    Chloride 99 98 - 111 mmol/L    CO2 24 22 - 32 mmol/L    Glucose, Bld 92 70 - 99 mg/dL      Comment: Glucose reference range applies only to samples taken after fasting for at least 8 hours.    BUN 15 8 - 23 mg/dL    Creatinine, Ser 0.80 0.44 - 1.00 mg/dL    Calcium 8.7 (L) 8.9 - 10.3 mg/dL    GFR, Estimated >60 >60 mL/min      Comment: (NOTE) Calculated using the CKD-EPI Creatinine Equation (2021)      Anion gap 9 5 - 15      Comment: Performed at [facility] [address] [place], [place] [address]  Magnesium     Status: None    Collection Time: 03/19/21  2:46 AM  Result Value Ref Range    Magnesium 1.9 1.7 - 2.4 mg/dL      Comment: Performed at [facility] [address], [place], [place] [address]    *Note: Due to a large number of results and/or encounters for the requested time period, some results have not been displayed. A complete set of results can be found in Results Review.      Imaging Results (Last 48 hours)  No results found.           Medical Problem List and Plan: 1.  Altered mental status secondary  to small left caudate head ICH with small IVH secondary hypertension and anticoagulation use             -patient may not yet shower while vac on             -ELOS/Goals: 14-16 days, min assist to supervision goals with PT, OT and mod I to supervision with SLP 2.  Antithrombotics: -DVT/anticoagulation: SCDs             -antiplatelet therapy: N/A 3. Pain Management: Tylenol as needed 4. Mood: Provide emotional support             -antipsychotic agents: N/A 5. Neuropsych: This patient is not capable of making decisions on her own behalf. 7. Fluids/Electrolytes/Nutrition: Routine in and outs with follow-up chemistries 8.  Hypertension.  Lisinopril 10 mg daily.  Monitor with increased mobility 9.  Atrial fibrillation/pacemaker.  Amiodarone 200 mg twice daily.   Eliquis discontinued due to [place] 10.  Hypothyroidism.  Synthroid 11.  Chronic left thigh wound.  Followed by Dr. Lucia Gaskins.  WOC following while in the hospital.  Dressing changes as directed.   -Wound VAC to be discontinued 03/21/2021 12.  NHL with SVC syndrome/lung cancer radiation therapy as well as chemo.  Followed by Dr.Gudena 13.  Obesity.  Status post lap band surgery.  BMI 32.10.  Dietary follow-up 14.  Acute on chronic anemia.  Follow-up CBC on admit labs     Cathlyn Parsons, PA-C 03/19/2021   I have personally performed a face to face diagnostic evaluation of this patient and formulated the key components of the plan.  Additionally, I have personally reviewed laboratory data, imaging studies, as well as relevant notes and concur with the physician assistant's documentation above.  The  patient's status has not changed from the original H&P.  Any changes in documentation from the acute care chart have been noted above.  Meredith Staggers, MD, [facility] Drown

## 2021-03-19 NOTE — Progress Notes (Signed)
Patient arrived to floor, with no noted distress, vss. Assessment completed. Call light in reach, bed in lowest position and bed locked.  Patient oriented to room but is confused at times; easily reoriented.

## 2021-03-19 NOTE — Discharge Summary (Signed)
Physician Discharge Summary  Kayla Harrison HTD:[id number] DOB: Nov 28, 1945 DOA: 03/14/2021  PCP: [name], MD  Admit date: 03/14/2021 Discharge date: 03/19/2021  Admitted From: home Disposition:  CIR  Recommendations for Outpatient Follow-up:  Patient will be discharged to CIR for inpatient rehab Referral to follow up with neurology as outpatient has been made  Discharge Condition:stable CODE STATUS:partial code, no intubation, no bipap Diet recommendation: heart healthy  Brief/Interim Summary: 75 year old female with a history of atrial fibrillation on anticoagulation, presents to the hospital with subcortical hemorrhage.  This was felt to be related to hypotension in the setting of anticoagulation.  Patient received factor for concentrate reverse Eliquis.  She was admitted to the hospital for further work-up.  Transferred to [facility] service on 6/25. Plans are to discharge to CIR on discharge  Discharge Diagnoses:  Active Problems:   HYPERLIPIDEMIA   Hypothyroidism   Persistent atrial fibrillation (HCC)   ICH (intracerebral hemorrhage) (HCC)   Anemia   Lower extremity edema   Intracranial hemorrhage (HCC)   Pressure injury of skin   Hypertension  Small left caudate head intracranial hemorrhage with small intraventricular hemorrhage -Secondary to hypertension and anticoagulation use -Unable to perform MRI due to pacemaker -Echo showed normal ejection fraction -LDL of 70 -A1c of 5.2 -No antiplatelet agents at this time due to bleed, will be readdressed on follow up with neuro -Seen by physical therapy/Occupational Therapy with recommendations for CIR -Plans are to DC to CIR today   Permanent atrial fibrillation -Chronically anticoagulated with Eliquis -This is been discontinued in light of intracranial hemorrhage -No antithrombotic agents due to bleed -Continue on amiodarone -HR stable   Hypertension -Blood pressures are stable -continue to follow    Hyperlipidemia -Resume home dose of Crestor on discharge   Chronic anemia -Suspect is related to her chronic wounds -Baseline hemoglobin appears to be between 8 and 9 -Currently, hemoglobin appears at baseline, continue to follow   Chronic left thigh wound s/p wound VAC -Patient is followed by orthopedics, Dr. Lucia Gaskins -She also follows with wound care center by Dr. Heber [place] -Appears that her wounds are clinically improving -No signs of active infection at this time -Wound care nurse discussed wound VAC with Dr. Lucia Gaskins who recommended that wound VAC can be discontinued.  Plan will be to discontinue wound VAC on 6/29 and change to wet-to-dry dressings for wound on upper-outer thigh wound -for upper-inner thigh wound, WOC has recommended: Apply Santyl to the left upper/inner thigh wound in a nickel thick layer. Cover with a saline moistened gauze, then dry gauze and secure with a foam dressing.  Change daily  Discharge Instructions  Discharge Instructions     Ambulatory referral to Neurology   Complete by: As directed    Follow up with Dr. Leonie Man at [facility] in 4-6 weeks. Too complicated for RN to follow. Thanks.   Diet - low sodium heart healthy   Complete by: As directed    Discharge wound care:   Complete by: As directed    Left outer upper thigh wound: Question:  Amount of suction?  Answer:  125 mm/Hg  Left inner upper thigh wound: Apply Santyl to the left upper/inner thigh wound in a nickel thick layer. Cover with a saline moistened gauze, then dry gauze and secure with a foam dressing.  Change daily.   Increase activity slowly   Complete by: As directed       Allergies as of 03/19/2021       Reactions   Ranolazine  Balance issues   Rivaroxaban    Nose Bleed X 6 hrs ; packing in ER Nasal hemorrhage   Tape Rash, Other (See Comments)   SKIN IS THIN AND TEARS EASILY (also does not tolerate paper tape well)     Tikosyn [dofetilide] Other (See Comments)   "Long QT wave"    Epinephrine Other (See Comments)   Oral anesthetic Dental form only (liquid). Patient stated she will become "out of it" she can hear you but cannot respond in normal fashion. "not fully with it"   Ketamine Other (See Comments)   Hallucinations        Medication List     STOP taking these medications    Ampicillin-Sulbactam 3 (2-1) g Solr injection Commonly known as: UNASYN   apixaban 5 MG Tabs tablet Commonly known as: ELIQUIS       TAKE these medications    acetaminophen 500 MG tablet Commonly known as: TYLENOL Take 1,000 mg by mouth every 6 (six) hours as needed for mild pain.   amiodarone 200 MG tablet Commonly known as: PACERONE Take 1 tablet (200 mg total) by mouth in the morning and at bedtime.   collagenase ointment Commonly known as: SANTYL Apply topically daily. Start taking on: March 20, 2021   furosemide 20 MG tablet Commonly known as: LASIX Take 20 mg by mouth daily as needed for fluid or edema.   HYDROcodone-acetaminophen 5-325 MG tablet Commonly known as: NORCO/VICODIN Take 1 tablet by mouth 2 (two) times daily as needed for severe pain.   levothyroxine 137 MCG tablet Commonly known as: SYNTHROID Take 68.5-137 mcg by mouth See admin instructions. Taking 137 mcg tablet daily except on Saturday taking 1/2 tablet   nystatin powder Commonly known as: MYCOSTATIN/NYSTOP Apply 1 application topically 3 (three) times daily as needed (rash/irritation (skin folds)).   OINTMENT BASE EX Apply 1 application topically daily as needed (changing of dressing on wound). Honey Manuka Ointment Wound   pantoprazole 40 MG tablet Commonly known as: PROTONIX Take 1 tablet (40 mg total) by mouth daily. Start taking on: March 20, 2021   rosuvastatin 10 MG tablet Commonly known as: CRESTOR Take 1 tablet (10 mg total) by mouth daily. Mondays & Thursdays What changed: when to take this   senna 8.6 MG Tabs tablet Commonly known as: SENOKOT Take 2 tablets (17.2 mg  total) by mouth at bedtime. For constipation--adjust as needed. Purchase OTC What changed:  when to take this reasons to take this               Discharge Care Instructions  (From admission, onward)           Start     Ordered   03/19/21 0000  Discharge wound care:       Comments: Left outer upper thigh wound: Question:  Amount of suction?  Answer:  125 mm/Hg  Left inner upper thigh wound: Apply Santyl to the left upper/inner thigh wound in a nickel thick layer. Cover with a saline moistened gauze, then dry gauze and secure with a foam dressing.  Change daily.   03/19/21 1432            Follow-up Information     Garvin Fila, MD Follow up in 1 month(s).   Specialties: Neurology, Radiology Why: stroke clinic Contact information: [place] [address] [phone]                Allergies  Allergen Reactions  Ranolazine     Balance issues   Rivaroxaban     Nose Bleed X 6 hrs ; packing in ER Nasal hemorrhage   Tape Rash and Other (See Comments)    SKIN IS THIN AND TEARS EASILY (also does not tolerate paper tape well)     Tikosyn [Dofetilide] Other (See Comments)    "Long QT wave"    Epinephrine Other (See Comments)    Oral anesthetic Dental form only (liquid). Patient stated she will become "out of it" she can hear you but cannot respond in normal fashion. "not fully with it"   Ketamine Other (See Comments)    Hallucinations     Consultations: Neurology Neurosurgery   Procedures/Studies: CT Angio Head W or Wo Contrast  Result Date: 03/14/2021 CLINICAL DATA:  Neuro deficit, acute stroke suspected. Hemorrhage seen on same day CT head. EXAM: CT ANGIOGRAPHY HEAD AND NECK TECHNIQUE: Multidetector CT imaging of the head and neck was performed using the standard protocol during bolus administration of intravenous contrast. Multiplanar CT image reconstructions and MIPs were obtained to evaluate the vascular anatomy.  Carotid stenosis measurements (when applicable) are obtained utilizing NASCET criteria, using the distal internal carotid diameter as the denominator. CONTRAST:  21mL OMNIPAQUE IOHEXOL 350 MG/ML SOLN COMPARISON:  None. FINDINGS: CTA NECK FINDINGS Aortic arch: Great vessel origins are patent. Right carotid system: No evidence of dissection, stenosis (50% or greater) or occlusion. Mild calcific atherosclerosis at the carotid bifurcation. Left carotid system: No evidence of dissection, stenosis (50% or greater) or occlusion. Mild calcific atherosclerosis at the carotid bifurcation. Vertebral arteries: Codominant. No evidence of dissection, stenosis (50% or greater) or occlusion. Skeleton: Severe degenerative disc disease at C5-C6 and C6-C7 with disc height loss and endplate spurring. Heterogeneous bone marrow and osteopenia. Other neck: No acute abnormality. Upper chest: Further evaluated on same day CT chest. Debris in the esophagus. Review of the MIP images confirms the above findings CTA HEAD FINDINGS Anterior circulation: Bilateral ICAs, MCAs, and ACAs are patent without proximal hemodynamically significant stenosis. Calcific atherosclerosis of bilateral intracranial ICAs without greater than 50% narrowing. No aneurysm. No visible vascular malformation in the region of the patient's known hemorrhage, although acute blood products limit evaluation. Posterior circulation: Patent intradural vertebral arteries and basilar artery. Mild calcific atherosclerotic narrowing of the basilar artery. Bilateral posterior cerebral arteries are patent. Mild-to-moderate left P1 and P2 PCA stenosis. Venous sinuses: As permitted by contrast timing, patent. Review of the MIP images confirms the above findings IMPRESSION: CTA Head: 1. No large vessel occlusion. Mild-to-moderate left P1 and P2 PCA stenosis. 2. No aneurysm or visible vascular malformation in the region of the patient's known hemorrhage, although acute blood products  limits evaluation. CTA Neck: 1. No significant (greater than 50%) stenosis. 2. Please see same day CT chest for intrathoracic evaluation. Findings discussed with Dr. Karle Starch via telephone at 8:18 p.m. Electronically Signed   By: Margaretha Sheffield MD   On: 03/14/2021 20:27   CT HEAD WO CONTRAST  Result Date: 03/15/2021 CLINICAL DATA:  Intracranial hemorrhage, follow-up EXAM: CT HEAD WITHOUT CONTRAST TECHNIQUE: Contiguous axial images were obtained from the base of the skull through the vertex without intravenous contrast. COMPARISON:  03/14/2021 FINDINGS: Brain: Area of parenchymal hemorrhage is again identified in the region of the left caudothalamic groove with intraventricular extension. Mild adjacent edema. No substantial change since the prior study. No new hemorrhage. There is unchanged mild trapping of the left lateral ventricle. No new loss of gray-white differentiation. Stable findings of  probable advanced chronic microvascular ischemic changes. Chronic infarct of the left caudate head. Vascular: There is atherosclerotic calcification at the skull base. Skull: Calvarium is unremarkable. Sinuses/Orbits: No acute finding. Other: None. IMPRESSION: Similar appearance of left caudothalamic hemorrhage with intraventricular extension and mild adjacent edema. No new hemorrhage. Unchanged mild trapping of the left lateral ventricle. Electronically Signed   By: Macy Mis M.D.   On: 03/15/2021 08:26   CT Head Wo Contrast  Result Date: 03/14/2021 CLINICAL DATA:  75 year old female with altered mental status. EXAM: CT HEAD WITHOUT CONTRAST TECHNIQUE: Contiguous axial images were obtained from the base of the skull through the vertex without intravenous contrast. COMPARISON:  Head CT dated 12/15/2020 FINDINGS: Brain: There is moderate age-related atrophy and chronic microvascular ischemic changes. Acute intracranial hemorrhage extends from the region of the caudothalamic groove into the left lateral ventricle  and through the left foramen Monro into the third ventricle. There is mild dilatation of the left lateral ventricle. There is approximately 5 mm left-to-right midline shift. Vascular: No hyperdense vessel or unexpected calcification. Skull: Normal. Negative for fracture or focal lesion. Sinuses/Orbits: No acute finding. Other: None IMPRESSION: 1. Acute intracranial hemorrhage extending from the region of the left caudothalamic groove into the left lateral ventricle and through the left foramen Monro into the third ventricle. There is mild dilatation of the left lateral ventricle and approximately 5 mm left-to-right midline shift. Findings may be hypertensive in nature or secondary to rupture of A-comm aneurysm. Further evaluation with CT angiography is recommended. 2. Moderate age-related atrophy and chronic microvascular ischemic changes. These results were called by telephone at the time of interpretation on 03/14/2021 at 6:53 pm to Dr. Kathrynn Humble, who verbally acknowledged these results. Electronically Signed   By: Anner Crete M.D.   On: 03/14/2021 18:58   CT Angio Neck W and/or Wo Contrast  Result Date: 03/14/2021 CLINICAL DATA:  Neuro deficit, acute stroke suspected. Hemorrhage seen on same day CT head. EXAM: CT ANGIOGRAPHY HEAD AND NECK TECHNIQUE: Multidetector CT imaging of the head and neck was performed using the standard protocol during bolus administration of intravenous contrast. Multiplanar CT image reconstructions and MIPs were obtained to evaluate the vascular anatomy. Carotid stenosis measurements (when applicable) are obtained utilizing NASCET criteria, using the distal internal carotid diameter as the denominator. CONTRAST:  20mL OMNIPAQUE IOHEXOL 350 MG/ML SOLN COMPARISON:  None. FINDINGS: CTA NECK FINDINGS Aortic arch: Great vessel origins are patent. Right carotid system: No evidence of dissection, stenosis (50% or greater) or occlusion. Mild calcific atherosclerosis at the carotid  bifurcation. Left carotid system: No evidence of dissection, stenosis (50% or greater) or occlusion. Mild calcific atherosclerosis at the carotid bifurcation. Vertebral arteries: Codominant. No evidence of dissection, stenosis (50% or greater) or occlusion. Skeleton: Severe degenerative disc disease at C5-C6 and C6-C7 with disc height loss and endplate spurring. Heterogeneous bone marrow and osteopenia. Other neck: No acute abnormality. Upper chest: Further evaluated on same day CT chest. Debris in the esophagus. Review of the MIP images confirms the above findings CTA HEAD FINDINGS Anterior circulation: Bilateral ICAs, MCAs, and ACAs are patent without proximal hemodynamically significant stenosis. Calcific atherosclerosis of bilateral intracranial ICAs without greater than 50% narrowing. No aneurysm. No visible vascular malformation in the region of the patient's known hemorrhage, although acute blood products limit evaluation. Posterior circulation: Patent intradural vertebral arteries and basilar artery. Mild calcific atherosclerotic narrowing of the basilar artery. Bilateral posterior cerebral arteries are patent. Mild-to-moderate left P1 and P2 PCA stenosis. Venous sinuses: As  permitted by contrast timing, patent. Review of the MIP images confirms the above findings IMPRESSION: CTA Head: 1. No large vessel occlusion. Mild-to-moderate left P1 and P2 PCA stenosis. 2. No aneurysm or visible vascular malformation in the region of the patient's known hemorrhage, although acute blood products limits evaluation. CTA Neck: 1. No significant (greater than 50%) stenosis. 2. Please see same day CT chest for intrathoracic evaluation. Findings discussed with Dr. Karle Starch via telephone at 8:18 p.m. Electronically Signed   By: Margaretha Sheffield MD   On: 03/14/2021 20:27   CT CHEST W CONTRAST  Result Date: 03/14/2021 CLINICAL DATA:  Slipped and fell. Patient is alert and oriented x4. Cough for unknown period of time. EXAM:  CT CHEST WITH CONTRAST TECHNIQUE: Multidetector CT imaging of the chest was performed during intravenous contrast administration. CONTRAST:  38mL OMNIPAQUE IOHEXOL 350 MG/ML SOLN COMPARISON:  None. FINDINGS: Cardiovascular: No significant vascular findings. Normal heart size. No pericardial effusion. Thoracic aortic atherosclerosis. Multi vessel coronary artery atherosclerosis. Two lead cardiac pacemaker. Mediastinum/Nodes: No enlarged mediastinal, hilar, or axillary lymph nodes. Thyroid gland, trachea, and esophagus demonstrate no significant findings. Lungs/Pleura: Small right pleural effusion. Trace left pleural effusion. No focal consolidation or pneumothorax. Upper Abdomen: No acute upper abdominal abnormality. 10 mm cyst in the left hepatic lobe. Musculoskeletal: No acute osseous abnormality. No aggressive osseous lesion. Generalized osteopenia. Anterior bridging osteophytes of the lower thoracic spine. Right shoulder arthroplasty. IMPRESSION: 1. Small right and trace left pleural effusion. 2.  Aortic Atherosclerosis (ICD10-I70.0). Electronically Signed   By: Kathreen Devoid   On: 03/14/2021 19:57   ECHOCARDIOGRAM COMPLETE  Result Date: 03/16/2021    ECHOCARDIOGRAM REPORT   Patient Name:   Kayla Harrison Date of Exam: 03/16/2021 Medical Rec #:  [id number]           Height:       66.0 in Accession #:    [id number]          Weight:       198.9 lb Date of Birth:  12/11/1945            BSA:          1.995 m Patient Age:    59 years            BP:           132/65 mmHg Patient Gender: F                   HR:           70 bpm. Exam Location:  Inpatient Procedure: 2D Echo, 3D Echo, Cardiac Doppler and Color Doppler Indications:    Stroke  History:        Patient has prior history of Echocardiogram examinations, most                 recent 03/09/2021. Abnormal ECG and Pacemaker, Arrythmias:Atrial                 Fibrillation and Atrial Flutter, Signs/Symptoms:Syncope, Dyspnea                 and Bacteremia; Risk  Factors:Dyslipidemia and Sleep Apnea. ICH.  Sonographer:    Roseanna Rainbow RDCS Referring Phys: [place]  Sonographer Comments: Technically difficult study due to poor echo windows. IMPRESSIONS  1. Left ventricular ejection fraction, by estimation, is 55 to 60%. The left ventricle has normal function. The left ventricle has no regional wall motion abnormalities. Left ventricular diastolic  parameters are indeterminate.  2. Right ventricular systolic function is normal. The right ventricular size is normal. There is moderately elevated pulmonary artery systolic pressure.  3. The mitral valve is abnormal. Severe mitral valve regurgitation. No evidence of mitral stenosis. Severe mitral annular calcification.  4. Tricuspid valve regurgitation is severe.  5. The aortic valve is calcified. Aortic valve regurgitation is trivial. Mild to moderate aortic valve sclerosis/calcification is present, without any evidence of aortic stenosis.  6. The inferior vena cava is dilated in size with <50% respiratory variability, suggesting right atrial pressure of 15 mmHg. FINDINGS  Left Ventricle: Left ventricular ejection fraction, by estimation, is 55 to 60%. The left ventricle has normal function. The left ventricle has no regional wall motion abnormalities. The left ventricular internal cavity size was normal in size. There is  no left ventricular hypertrophy. Left ventricular diastolic parameters are indeterminate. Right Ventricle: The right ventricular size is normal. Right ventricular systolic function is normal. There is moderately elevated pulmonary artery systolic pressure. The tricuspid regurgitant velocity is 3.06 m/s, and with an assumed right atrial pressure of 15 mmHg, the estimated right ventricular systolic pressure is 93.2 mmHg. Left Atrium: Left atrial size was normal in size. Right Atrium: Right atrial size was normal in size. Pericardium: There is no evidence of pericardial effusion. Mitral Valve: The  mitral valve is abnormal. Severe mitral annular calcification. Severe mitral valve regurgitation. No evidence of mitral valve stenosis. MV peak gradient, 13.4 mmHg. The mean mitral valve gradient is 4.0 mmHg. Tricuspid Valve: The tricuspid valve is normal in structure. Tricuspid valve regurgitation is severe. No evidence of tricuspid stenosis. Aortic Valve: The aortic valve is calcified. Aortic valve regurgitation is trivial. Mild to moderate aortic valve sclerosis/calcification is present, without any evidence of aortic stenosis. Aortic valve mean gradient measures 9.0 mmHg. Aortic valve peak  gradient measures 15.4 mmHg. Aortic valve area, by VTI measures 2.09 cm. Pulmonic Valve: The pulmonic valve was not well visualized. Pulmonic valve regurgitation is not visualized. No evidence of pulmonic stenosis. Aorta: The aortic root is normal in size and structure. Venous: The inferior vena cava is dilated in size with less than 50% respiratory variability, suggesting right atrial pressure of 15 mmHg. IAS/Shunts: No atrial level shunt detected by color flow Doppler. Additional Comments: A device lead is visualized.  LEFT VENTRICLE PLAX 2D LVIDd:         5.10 cm      Diastology LVIDs:         4.00 cm      LV e' medial:  9.90 cm/s LV PW:         0.90 cm      LV e' lateral: 11.00 cm/s LV IVS:        0.80 cm LVOT diam:     1.90 cm LV SV:         83 LV SV Index:   42 LVOT Area:     2.84 cm  LV Volumes (MOD) LV vol d, MOD A2C: 130.0 ml LV vol d, MOD A4C: 141.0 ml LV vol s, MOD A2C: 49.6 ml LV vol s, MOD A4C: 75.8 ml LV SV MOD A2C:     80.4 ml LV SV MOD A4C:     141.0 ml LV SV MOD BP:      70.7 ml RIGHT VENTRICLE            IVC RV S prime:     8.16 cm/s  IVC diam: 2.50 cm TAPSE (M-mode):  1.3 cm LEFT ATRIUM             Index       RIGHT ATRIUM           Index LA diam:        3.80 cm 1.90 cm/m  RA Area:     19.50 cm LA Vol (A2C):   41.7 ml 20.90 ml/m RA Volume:   53.00 ml  26.57 ml/m LA Vol (A4C):   59.1 ml 29.63 ml/m LA  Biplane Vol: 51.7 ml 25.92 ml/m  AORTIC VALVE AV Area (Vmax):    2.24 cm AV Area (Vmean):   2.09 cm AV Area (VTI):     2.09 cm AV Vmax:           196.00 cm/s AV Vmean:          137.000 cm/s AV VTI:            0.398 m AV Peak Grad:      15.4 mmHg AV Mean Grad:      9.0 mmHg LVOT Vmax:         155.00 cm/s LVOT Vmean:        101.000 cm/s LVOT VTI:          0.293 m LVOT/AV VTI ratio: 0.74  AORTA Ao Root diam: 3.10 cm Ao Asc diam:  3.30 cm MITRAL VALVE                 TRICUSPID VALVE MV Area (PHT): 5.02 cm      TR Peak grad:   37.5 mmHg MV Area VTI:   2.17 cm      TR Vmax:        306.00 cm/s MV Peak grad:  13.4 mmHg MV Mean grad:  4.0 mmHg      SHUNTS MV Vmax:       1.83 m/s      Systemic VTI:  0.29 m MV Vmean:      81.1 cm/s     Systemic Diam: 1.90 cm MV Decel Time: 151 msec MR Peak grad:    111.5 mmHg MR Mean grad:    69.0 mmHg MR Vmax:         528.00 cm/s MR Vmean:        383.0 cm/s MR PISA:         0.57 cm MR PISA Eff ROA: 3 mm MR PISA Radius:  0.30 cm Kirk Ruths MD Electronically signed by Kirk Ruths MD Signature Date/Time: 03/16/2021/1:01:03 PM    Final    ECHO TEE  Result Date: 03/09/2021    TRANSESOPHOGEAL ECHO REPORT   Patient Name:   Kayla Harrison Date of Exam: 03/09/2021 Medical Rec #:  [id number]           Height:       66.0 in Accession #:    [id number]          Weight:       210.0 lb Date of Birth:  1946-07-25            BSA:          2.042 m Patient Age:    63 years            BP:           159/102 mmHg Patient Gender: F                   HR:  106 bpm. Exam Location:  Inpatient Procedure: 3D Echo, Transesophageal Echo, Cardiac Doppler and Color Doppler Indications:     I48.91* Unspeicified atrial fibrillation  History:         Patient has prior history of Echocardiogram examinations, most                  recent 12/10/2019. Abnormal ECG and Pacemaker, Stroke,                  Arrythmias:Atrial Fibrillation and Atrial Flutter,                  Signs/Symptoms:Shortness of  Breath, Dyspnea and Syncope; Risk                  Factors:Dyslipidemia and Sleep Apnea.  Sonographer:     Roseanna Rainbow RDCS Referring Phys:  [place] Diagnosing Phys: Lyman Bishop MD PROCEDURE: After discussion of the risks and benefits of a TEE, an informed consent was obtained from the patient. The transesophogeal probe was passed without difficulty through the esophogus of the patient. Imaged were obtained with the patient in a supine position. Sedation performed by different physician. The patient was monitored while under deep sedation. Anesthestetic sedation was provided intravenously by Anesthesiology: 280mg  of Propofol. The patient developed no complications during the procedure. A successful direct current cardioversion was performed at 120 joules with 1 attempt. IMPRESSIONS  1. Left ventricular ejection fraction, by estimation, is 55 to 60%. The left ventricle has normal function. There is mild left ventricular hypertrophy.  2. Right ventricular systolic function is normal. The right ventricular size is normal.  3. Wide neck, short appendage. Left atrial size was moderately dilated. No left atrial/left atrial appendage thrombus was detected.  4. Right atrial size was mildly dilated.  5. Moderate mitral subvalvular calcification.  6. The mitral valve is abnormal. Moderate mitral valve regurgitation. Mild mitral stenosis. The mean mitral valve gradient is 5.0 mmHg. Moderate to severe mitral annular calcification.  7. Tricuspid valve regurgitation is moderate.  8. The aortic valve is tricuspid. Aortic valve regurgitation is not visualized. Mild aortic valve sclerosis is present, with no evidence of aortic valve stenosis. Conclusion(s)/Recommendation(s): No LA/LAA thrombus identified. Successful cardioversion performed with restoration of normal sinus rhythm. FINDINGS  Left Ventricle: Left ventricular ejection fraction, by estimation, is 55 to 60%. The left ventricle has normal function. The left  ventricular internal cavity size was normal in size. There is mild left ventricular hypertrophy. Right Ventricle: The right ventricular size is normal. No increase in right ventricular wall thickness. Right ventricular systolic function is normal. Left Atrium: Wide neck, short appendage. Left atrial size was moderately dilated. Spontaneous echo contrast was present. No left atrial/left atrial appendage thrombus was detected. Right Atrium: Right atrial size was mildly dilated. Pericardium: There is no evidence of pericardial effusion. Mitral Valve: The mitral valve is abnormal. Moderate to severe mitral annular calcification. Moderate mitral subvalvular calcification. Moderate mitral valve regurgitation, with 2 jets. Mild mitral valve stenosis. MV peak gradient, 12.1 mmHg. The mean mitral valve gradient is 5.0 mmHg. Tricuspid Valve: The tricuspid valve is grossly normal. Tricuspid valve regurgitation is moderate. Aortic Valve: The aortic valve is tricuspid. Aortic valve regurgitation is not visualized. Mild aortic valve sclerosis is present, with no evidence of aortic valve stenosis. Pulmonic Valve: The pulmonic valve was grossly normal. Pulmonic valve regurgitation is mild. No evidence of pulmonic stenosis. Aorta: The aortic root and ascending aorta are structurally normal, with no evidence  of dilitation. IAS/Shunts: No atrial level shunt detected by color flow Doppler. Additional Comments: A device lead is visualized.  MITRAL VALVE MV Peak grad: 12.1 mmHg MV Mean grad: 5.0 mmHg MV Vmax:      1.74 m/s MV Vmean:     97.3 cm/s Lyman Bishop MD Electronically signed by Lyman Bishop MD Signature Date/Time: 03/09/2021/2:57:31 PM    Final    CUP PACEART REMOTE DEVICE CHECK  Result Date: 03/08/2021 Scheduled remote reviewed. Normal device function.  AF burden 27.4%; longest logged 16 hours; EGMs suggest A Flutter - ongoing; V rate histogram shows ~85% binned >100 bpm. History of AF and prescribed amiodarone. Routing to  triage for ongoing. 8 VT-NS events logged 1 to 2 seconds w/ rates 150's to 170's bpm; available EGMs suggest SVT Next remote 91 days. HB Patient has cardioversion scheduled on 03/09/21. [facility] RN     Subjective: No new complaints, still has bilateral leg weakness  Discharge Exam: Vitals:   03/19/21 0356 03/19/21 0800 03/19/21 1219 03/19/21 1300  BP: 129/66 137/69 (!) 86/55 112/63  Pulse: 67 70 69   Resp: 18 18 18 18   Temp: 98.1 F (36.7 C) 97.8 F (36.6 C) 97.7 F (36.5 C)   TempSrc: Oral Oral Oral   SpO2: 98% 97% 99%   Weight:      Height:        General: Pt is alert, awake, not in acute distress Cardiovascular: RRR, S1/S2 +, no rubs, no gallops Respiratory: CTA bilaterally, no wheezing, no rhonchi Abdominal: Soft, NT, ND, bowel sounds + Extremities: no edema, no cyanosis    The results of significant diagnostics from this hospitalization (including imaging, microbiology, ancillary and laboratory) are listed below for reference.     Microbiology: Recent Results (from the past 240 hour(s))  SARS CORONAVIRUS 2 (TAT 6-24 HRS) Nasopharyngeal Nasopharyngeal Swab     Status: None   Collection Time: 03/14/21  9:03 PM   Specimen: Nasopharyngeal Swab  Result Value Ref Range Status   SARS Coronavirus 2 NEGATIVE NEGATIVE Final    Comment: (NOTE) SARS-CoV-2 target nucleic acids are NOT DETECTED.  The SARS-CoV-2 RNA is generally detectable in upper and lower respiratory specimens during the acute phase of infection. Negative results do not preclude SARS-CoV-2 infection, do not rule out co-infections with other pathogens, and should not be used as the sole basis for treatment or other patient management decisions. Negative results must be combined with clinical observations, patient history, and epidemiological information. The expected result is Negative.  Fact Sheet for Patients: SugarRoll.be  Fact Sheet for Healthcare  Providers: https://www.woods-mathews.com/  This test is not yet approved or cleared by the [place] FDA and  has been authorized for detection and/or diagnosis of SARS-CoV-2 by FDA under an Emergency Use Authorization (EUA). This EUA will remain  in effect (meaning this test can be used) for the duration of the COVID-19 declaration under Se ction 564(b)(1) of the Act, 21 U.S.C. section 360bbb-3(b)(1), unless the authorization is terminated or revoked sooner.  Performed at [facility], [place] [address] [place], [place] [address]   MRSA Next Gen by PCR, Nasal     Status: None   Collection Time: 03/14/21 11:35 PM   Specimen: Nasal Mucosa; Nasal Swab  Result Value Ref Range Status   MRSA by PCR Next Gen NOT DETECTED NOT DETECTED Final    Comment: (NOTE) The GeneXpert MRSA Assay (FDA approved for NASAL specimens only), is one component of a comprehensive MRSA colonization surveillance  program. It is not intended to diagnose MRSA infection nor to guide or monitor treatment for MRSA infections. Test performance is not FDA approved in patients less than 69 years old. Performed at [facility], [place] [address] [place], [place] [address]      Labs: BNP (last 3 results) Recent Labs    01/07/21 0502  BNP 096.2*   Basic Metabolic Panel: Recent Labs  Lab 03/14/21 1743 03/17/21 0111 03/19/21 0246  NA 138 133* 132*  K 3.6 3.5 4.1  CL 100 101 99  CO2 29 26 24   GLUCOSE 111* 97 92  BUN 18 18 15   CREATININE 0.98 0.91 0.80  CALCIUM 9.0 8.3* 8.7*  MG  --   --  1.9   Liver Function Tests: Recent Labs  Lab 03/14/21 1743  AST 27  ALT 23  ALKPHOS 97  BILITOT 1.0  PROT 7.0  ALBUMIN 3.7   No results for input(s): LIPASE, AMYLASE in the last 168 hours. No results for input(s): AMMONIA in the last 168 hours. CBC: Recent Labs  Lab 03/14/21 1743 03/17/21 0111 03/19/21 0246  WBC 12.2* 6.5 6.3  NEUTROABS 9.9*  --   --   HGB 10.6* 8.4* 9.2*  HCT  34.5* 27.7* 30.3*  MCV 89.6 88.5 87.8  PLT 254 220 234   Cardiac Enzymes: Recent Labs  Lab 03/14/21 1743  CKTOTAL 50   BNP: Invalid input(s): POCBNP CBG: No results for input(s): GLUCAP in the last 168 hours. D-Dimer No results for input(s): DDIMER in the last 72 hours. Hgb A1c Recent Labs    03/17/21 0111  HGBA1C 5.2   Lipid Profile Recent Labs    03/17/21 0111  CHOL 120  HDL 36*  LDLCALC 70  TRIG 70  CHOLHDL 3.3   Thyroid function studies No results for input(s): TSH, T4TOTAL, T3FREE, THYROIDAB in the last 72 hours.  Invalid input(s): FREET3 Anemia work up No results for input(s): VITAMINB12, FOLATE, FERRITIN, TIBC, IRON, RETICCTPCT in the last 72 hours. Urinalysis    Component Value Date/Time   COLORURINE STRAW (A) 03/14/2021 1738   APPEARANCEUR CLEAR 03/14/2021 1738   LABSPEC 1.006 03/14/2021 1738   PHURINE 8.0 03/14/2021 1738   GLUCOSEU NEGATIVE 03/14/2021 1738   GLUCOSEU Color Interference (A) 11/15/2014 1401   HGBUR NEGATIVE 03/14/2021 1738   HGBUR large 04/29/2008 1308   BILIRUBINUR NEGATIVE 03/14/2021 1738   KETONESUR NEGATIVE 03/14/2021 1738   PROTEINUR NEGATIVE 03/14/2021 1738   UROBILINOGEN Color Interference (A) 11/15/2014 1401   NITRITE NEGATIVE 03/14/2021 1738   LEUKOCYTESUR NEGATIVE 03/14/2021 1738   Sepsis Labs Invalid input(s): PROCALCITONIN,  WBC,  LACTICIDVEN Microbiology Recent Results (from the past 240 hour(s))  SARS CORONAVIRUS 2 (TAT 6-24 HRS) Nasopharyngeal Nasopharyngeal Swab     Status: None   Collection Time: 03/14/21  9:03 PM   Specimen: Nasopharyngeal Swab  Result Value Ref Range Status   SARS Coronavirus 2 NEGATIVE NEGATIVE Final    Comment: (NOTE) SARS-CoV-2 target nucleic acids are NOT DETECTED.  The SARS-CoV-2 RNA is generally detectable in upper and lower respiratory specimens during the acute phase of infection. Negative results do not preclude SARS-CoV-2 infection, do not rule out co-infections with other  pathogens, and should not be used as the sole basis for treatment or other patient management decisions. Negative results must be combined with clinical observations, patient history, and epidemiological information. The expected result is Negative.  Fact Sheet for Patients: SugarRoll.be  Fact Sheet for Healthcare Providers: https://www.woods-mathews.com/  This test  is not yet approved or cleared by the [place] and  has been authorized for detection and/or diagnosis of SARS-CoV-2 by FDA under an Emergency Use Authorization (EUA). This EUA will remain  in effect (meaning this test can be used) for the duration of the COVID-19 declaration under Se ction 564(b)(1) of the Act, 21 U.S.C. section 360bbb-3(b)(1), unless the authorization is terminated or revoked sooner.  Performed at [facility], [place] [address] [place], [place] [address]   MRSA Next Gen by PCR, Nasal     Status: None   Collection Time: 03/14/21 11:35 PM   Specimen: Nasal Mucosa; Nasal Swab  Result Value Ref Range Status   MRSA by PCR Next Gen NOT DETECTED NOT DETECTED Final    Comment: (NOTE) The GeneXpert MRSA Assay (FDA approved for NASAL specimens only), is one component of a comprehensive MRSA colonization surveillance program. It is not intended to diagnose MRSA infection nor to guide or monitor treatment for MRSA infections. Test performance is not FDA approved in patients less than 53 years old. Performed at [facility], [place] [address], [place], [place] [address]      Time coordinating discharge: 41mins  SIGNED:   Kathie Dike, MD  [facility] 03/19/2021, 2:33 PM   If 7PM-7AM, please contact night-coverage [email or web address]

## 2021-03-19 NOTE — PMR Pre-admission (Signed)
PMR Admission Coordinator Pre-Admission Assessment  Patient: Kayla Harrison is an 75 y.o., female MRN: [id number] DOB: Mar 24, 1946 Height: $RemoveBefo'5\' 6"'PjvCMTAjzkx$  (167.6 cm) Weight: 90.2 kg  Insurance Information HMO:     PPO:      PCP:      IPA:      80/20: yes     OTHER:  PRIMARY: Medicare Part A and B      Policy#: [id number]      Subscriber: Pt.  Phone#: Verified online   [phone]/0/5397  Fax#:  Pre-Cert#:       Employer:  Benefits:  Phone #:      Name:  Eff. Date: Parts A ad B effective  Deduct: $1556      Out of Pocket Max:  None      Life Max: N/A  CIR: 100%      SNF: 100 days Outpatient: 80%     Co-Pay: 20% Home Health: 100%      Co-Pay: none DME: 80%     Co-Pay: 20% Providers: patient's choice  SECONDARY: [employer]      Policy#: [id number]      Financial Counselor:       Phone#:   The "Data Collection Information Summary" for patients in Inpatient Rehabilitation Facilities with attached "Privacy Act [place] Records" was provided and verbally reviewed with: Family Member  Emergency Contact Information Contact Information     Name Relation Home Work Mobile   [place] Sister   [phone]   Smith,[name] [phone]     [name] Friend   [phone]       Current Medical History  Patient Admitting Diagnosis: ICH/IVH History of Present Illness: Kayla Harrison is a 75 year old right-handed female well-known to rehab services from admission 02/13/2021 to 02/23/2021 for debility secondary to thigh wound hematoma status post I&D 02/06/2021 with wound VAC placement and drain as well as history of thyroid cancer, hypothyroidism, CVA,NHL/SVC syndrome/lung cancer radiation therapy as well as chemotherapy 2018 followed by Dr.Gudena, pacemaker placement for atrial fibrillation maintained on Eliquis followed by Dr. Caryl Comes, obesity status post lap band surgery hypertension hyperlipidemia and recurrent falls.  Per chart review patient lives alone independent with  assistive device 1 level apartment with level entry.  She does have good family support.  Presented 03/14/2021 with altered mental status found down after she reportedly slipped off the bed.  No loss of consciousness.  Admission chemistries urine drug screen negative, glucose 111, hemoglobin 10.6, SARS coronavirus negative, CK 41. Cranial CT scan showed acute intracranial hemorrhage extending from the region of the left caudothalamic groove into the left lateral ventricle and through the left foramen Monro into the third ventricle.  There was mild dilation of the left lateral ventricle approxi-5 mm left to right midline shift.  Findings indicate of of hypertensive in nature or secondary to rupture of a-comm aneurysm.  CT angiogram of head and neck no large vessel occlusion.  No significant stenosis of the neck.  No aneurysm or visible vascular malformation in the region of the patient's known hemorrhage.  Neurology consulted and her Eliquis was reversed and and discontinued due to [place] and currently awaiting plan for possibly beginning Lovenox.  Echocardiogram completed showing ejection fraction of 55 to 60% no wall motion abnormalities.  In regards to patient's chronic left thigh wound status post wound VAC followed by Dr. Lucia Gaskins as well as the wound center clinic Dr. Heber  and currently being followed by [facility] while in the hospital.  Therapy evaluations completed due to patient decreased functional mobility altered mental status recommendations of inpatient rehab services and patient was admitted for a comprehensive rehab program. Complete NIHSS TOTAL: 2  Patient's medical record from [place] has been reviewed by the rehabilitation admission coordinator and physician.  Past Medical History  Past Medical History:  Diagnosis Date   Anemia    Arthritis    osteoarthritis - knees and right shoulder   Blood transfusion without reported diagnosis    Breast cancer ([place])    Dr Margot Chimes, total  thyroidectomy- 1999- for cancer   Brucellosis 1964   Chronic bilateral pleural effusions    Colon polyp    Dr Earlean Shawl   Complication of anesthesia    Ketamine produces LSD reaction, bright colored nightmarish experience    Dyslipidemia    Endometriosis    Fibroids    H/O pleural effusion    s/p thoracentesis w 3213ml withdrawn   Hepatitis    [place], ?hepatitis    History of dysphagia    due to radiation therapy   History of hiatal hernia    small noted on PET scan   Hypertension    Hypothyroidism    Lung cancer, lower lobe ([place]) 01/2017   radiation RX completed 03/04/17; will start chemo 6/27, pt unaware of lung cancer   Morbid obesity ([place])    Status post lap band surgery   Nephrolithiasis    Non Hodgkin's lymphoma ([place])    on chemotherapy   Persistent atrial fibrillation ([place])    a. s/p PVI 2008 b. s/p convergent ablation 1540 complicated by bradycardia requiring pacemaker implant   Personal history of radiation therapy    Presence of permanent cardiac pacemaker    Rotator cuff tear    Right   Stroke ([place])    2003- [place] x2   SVC syndrome    with lung mass and non hodgkins lymphoma   Thyroid cancer ([place]) 2000    Family History   family history includes Colon cancer in her father; Diabetes in her brother, paternal aunt, paternal grandmother, and sister; Heart attack in her father; Heart disease (age of onset: 24) in her father; Other in her mother.  Prior Rehab/Hospitalizations Has the patient had prior rehab or hospitalizations prior to admission? Yes  Has the patient had major surgery during 100 days prior to admission? Yes   Current Medications  Current Facility-Administered Medications:    acetaminophen (TYLENOL) tablet 650 mg, 650 mg, Oral, Q4H PRN, 650 mg at 03/19/21 0840 **OR** acetaminophen (TYLENOL) 160 MG/5ML solution 650 mg, 650 mg, Per Tube, Q4H PRN **OR** acetaminophen (TYLENOL) suppository 650 mg, 650 mg, Rectal,  Q4H PRN, Greta Doom, MD   amiodarone (PACERONE) tablet 200 mg, 200 mg, Oral, BID, Greta Doom, MD, 200 mg at 03/19/21 0840   Chlorhexidine Gluconate Cloth 2 % PADS 6 each, 6 each, Topical, Q0600, Greta Doom, MD, 6 each at 03/17/21 0704   collagenase (SANTYL) ointment, , Topical, Daily, Kary Kos, MD, Given at 03/19/21 0841   labetalol (NORMODYNE) injection 10 mg, 10 mg, Intravenous, Q10 min PRN, Greta Doom, MD, 10 mg at 03/16/21 2051   levothyroxine (SYNTHROID) tablet 137 mcg, 137 mcg, Oral, Once per day on Sun Mon Tue Wed Thu Fri, Kerney Elbe, MD, 137 mcg at 03/19/21 0867   levothyroxine (SYNTHROID) tablet 68.5 mcg, 68.5 mcg, Oral, Once per day on Sat, Kirkpatrick, McNeill P, MD, 68.5 mcg at  03/17/21 0656   MEDLINE mouth rinse, 15 mL, Mouth Rinse, BID, Greta Doom, MD, 15 mL at 03/19/21 0841   pantoprazole (PROTONIX) EC tablet 40 mg, 40 mg, Oral, Daily, Henri Medal, RPH, 40 mg at 03/19/21 9326   senna-docusate (Senokot-S) tablet 1 tablet, 1 tablet, Oral, BID, Greta Doom, MD, 1 tablet at 03/19/21 0840  Patients Current Diet:  Diet Order             Diet - low sodium heart healthy           Diet Heart Room service appropriate? Yes; Fluid consistency: Thin  Diet effective now                   Precautions / Restrictions Precautions Precautions: Fall Precaution Comments: L hip wound vac; SBP goal < 140 Restrictions Weight Bearing Restrictions: No LUE Weight Bearing: Touch down weight bearing LLE Weight Bearing: Weight bearing as tolerated   Has the patient had 2 or more falls or a fall with injury in the past year? Yes  Prior Activity Level Limited Community (1-2x/wk): Pt. went out a few times a week  Prior Functional Level Self Care: Did the patient need help bathing, dressing, using the toilet or eating? Independent  Indoor Mobility: Did the patient need assistance with walking from room to room (with  or without device)? Independent  Stairs: Did the patient need assistance with internal or external stairs (with or without device)? Independent  Functional Cognition: Did the patient need help planning regular tasks such as shopping or remembering to take medications? [place] / [place] Devices/Equipment: Cane (specify quad or straight), Eyeglasses, Environmental consultant (specify type) Home Equipment: Walker - 4 wheels, Shower seat - built in, [place] - single point, [employer] - tub/shower  Prior Device Use: Indicate devices/aids used by the patient prior to current illness, exacerbation or injury? Walker  Current Functional Level Cognition  Arousal/Alertness: Awake/alert Overall Cognitive Status: Impaired/Different from baseline Current Attention Level: Focused Orientation Level: Oriented to person, Oriented to place, Disoriented to time, Disoriented to situation Following Commands: Follows one step commands consistently, Follows one step commands with increased time, Follows multi-step commands inconsistently Safety/Judgement: Decreased awareness of deficits General Comments: Pt slow to process cues, needing repeated simple multi-modal cues to sequence all tasks. Pt with flat affect and appeared anxious with standing mobility. Needs reminders for proper posture and balance. Attention: Selective Selective Attention: Impaired Selective Attention Impairment: Verbal basic, Functional basic Memory: Impaired Memory Impairment: Retrieval deficit, Storage deficit, Decreased recall of new information Awareness: Impaired Awareness Impairment: Anticipatory impairment, Intellectual impairment (some emergent awareness of what she is having trouble with) Problem Solving: Impaired Problem Solving Impairment: Verbal basic, Functional basic Safety/Judgment: Impaired    Extremity Assessment (includes Sensation/Coordination)  Upper Extremity Assessment: Generalized weakness   Lower Extremity Assessment: Defer to PT evaluation RLE Deficits / Details: MMT scores of the following: hip flexion 2+, knee extension 4+, knee flexion 4-, ankle dorsiflexion 4+ RLE Sensation: decreased light touch (at feet, reports present to incident resulting in admission) RLE Coordination: decreased gross motor LLE Deficits / Details: MMT scores of the following: hip flexion 3+, knee extension 4+, knee flexion 4, ankle dorsiflexion 4 LLE Sensation: decreased light touch (at feet, reports present to incident resulting in admission) LLE Coordination: decreased gross motor    ADLs  Overall ADL's : Needs assistance/impaired Upper Body Dressing : Minimal assistance, Sitting Lower Body Dressing: Moderate assistance, Sit to/from stand Functional  mobility during ADLs: Cueing for safety, Cueing for sequencing, Rolling walker, Moderate assistance    Mobility  Overal bed mobility: Needs Assistance Bed Mobility: Supine to Sit Supine to sit: HOB elevated, Min assist General bed mobility comments: Cued pt to reach for L bed rail to assist with trunk ascension, minA to control legs descending off EOB as they would hurt with a quick descent.    Transfers  Overall transfer level: Needs assistance Equipment used: Rolling walker (2 wheeled) Transfers: Sit to/from Stand, [employer] Transfers Sit to Stand: Mod assist, From elevated surface Stand pivot transfers: Mod assist, From elevated surface General transfer comment: Sit to stand 1x from EOB and 1x from recliner, cuing for hand position and to rock to gain momentum, modA to power up, steady, and obtain upright posture and reduce posterior lean. ModA to stand step to L bed > recliner with RW, cuing for feet placement and RW management.    Ambulation / Gait / Stairs / Wheelchair Mobility  Ambulation/Gait Ambulation/Gait assistance: Mod assist Gait Distance (Feet): 8 Feet (x2 bouts of ~3 ft > ~8 ft) Assistive device: Rolling walker (2  wheeled) Gait Pattern/deviations: Decreased stride length, Shuffle, Trunk flexed, Leaning posteriorly, Step-through pattern, Decreased stance time - right General Gait Details: Pt with trunk flexed and posterior lean, tactile and verbal cues to correct, mod success. Practiced lateral weight shifting and marching prior to stepping anteriorly. Improved weight shifting and leg advancement bil. Noted decreased stride length and bil feet clearance, needing visual cues to improve through cuing pt to step on PT's feet anterior to her, success noted. [place] for stability and guidance. Gait velocity: decreased Gait velocity interpretation: <1.31 ft/sec, indicative of household ambulator    Posture / Balance Dynamic Sitting Balance Sitting balance - Comments: Static sitting EOB with supervision. Balance Overall balance assessment: Needs assistance Sitting-balance support: No upper extremity supported, Feet supported Sitting balance-Leahy Scale: Fair Sitting balance - Comments: Static sitting EOB with supervision. Standing balance support: Bilateral upper extremity supported, During functional activity Standing balance-Leahy Scale: Poor Standing balance comment: Reliant on bil UE support and physical assistance.    Special needs/care consideration Skin ecchymosis to bilateral legs, blister to L thigh   Previous Home Environment (from acute therapy documentation) Living Arrangements: Alone  Lives With: Alone Available Help at Discharge: Family, Available PRN/intermittently Type of Home: Apartment Home Layout: One level Home Access: Level entry Bathroom Shower/Tub: Multimedia programmer: Standard Bathroom Accessibility: Yes How Accessible: Accessible via walker [place]: Yes Type of Home Care Services: [place] (if known): agency name unknown Additional Comments: Pt has cat named "Callie".  Discharge Living Setting Plans for Discharge Living Setting:  Patient's home Type of Home at Discharge: House Discharge Home Layout: One level Discharge Home Access: Level entry Discharge Bathroom Shower/Tub: Walk-in shower Discharge Bathroom Toilet: Standard Discharge Bathroom Accessibility: Yes Does the patient have any problems obtaining your medications?: No  Social/Family/Support Systems Patient Roles: Other (Comment) Contact Information: [phone] Anticipated Caregiver's Contact Information: [phone] Caregiver Availability: 24/7 Discharge Plan Discussed with Primary Caregiver: Yes Is Caregiver In Agreement with Plan?: Yes Does Caregiver/Family have Issues with Lodging/Transportation while Pt is in Rehab?: Yes  Goals Patient/Family Goal for Rehab: PT/OT Min A, Mod I with SLP Expected length of stay: 14-16 days Pt/Family Agrees to Admission and willing to participate: Yes Program Orientation Provided & Reviewed with Pt/Caregiver Including Roles  & Responsibilities: Yes  Decrease burden of Care through IP rehab admission: [facility] equipment  needs, Decrease number of caregivers, Bowel and bladder program, and Patient/family education  Possible need for SNF placement upon discharge: not anticipated   Patient Condition: I have reviewed medical records from [facility], spoken with CM, and patient and family member. I met with patient at the bedside and discussed via phone for inpatient rehabilitation assessment.  Patient will benefit from ongoing PT, OT, and SLP, can actively participate in 3 hours of therapy a day 5 days of the week, and can make measurable gains during the admission.  Patient will also benefit from the coordinated team approach during an Inpatient Acute Rehabilitation admission.  The patient will receive intensive therapy as well as Rehabilitation physician, nursing, social worker, and care management interventions.  Due to safety, skin/wound care, disease management, medication administration, pain  management, and patient education the patient requires 24 hour a day rehabilitation nursing.  The patient is currently mod-max A with mobility and basic ADLs.  Discharge setting and therapy post discharge at home with home health is anticipated.  Patient has agreed to participate in the Acute Inpatient Rehabilitation Program and will admit today.  Preadmission Screen Completed By:  Genella Mech, 03/19/2021 2:37 PM ______________________________________________________________________   Discussed status with Dr. Naaman Plummer on 03/19/21 at  230pm and received approval for admission today.  Admission Coordinator:  Genella Mech, CCC-SLP, time 245pm/Date 03/19/21   Assessment/Plan: Diagnosis: left caudate ICH Does the need for close, 24 hr/day Medical supervision in concert with the patient's rehab needs make it unreasonable for this patient to be served in a less intensive setting? Yes Co-Morbidities requiring supervision/potential complications:  a fib, htn, large left thigh wound Due to bladder management, bowel management, safety, skin/wound care, disease management, medication administration, pain management, and patient education, does the patient require 24 hr/day rehab nursing? Yes Does the patient require coordinated care of a physician, rehab nurse, PT, OT, and SLP to address physical and functional deficits in the context of the above medical diagnosis(es)? Yes Addressing deficits in the following areas: balance, endurance, locomotion, strength, transferring, bowel/bladder control, bathing, dressing, feeding, grooming, toileting, cognition, speech, and psychosocial support Can the patient actively participate in an intensive therapy program of at least 3 hrs of therapy 5 days a week? Yes The potential for patient to make measurable gains while on inpatient rehab is excellent Anticipated functional outcomes upon discharge from inpatient rehab: supervision and min assist PT, supervision and min  assist OT, modified independent and supervision SLP Estimated rehab length of stay to reach the above functional goals is: 14-16 days Anticipated discharge destination: Home 10. Overall Rehab/Functional Prognosis: excellent   MD Signature: Meredith Staggers, MD, [facility] 03/19/2021

## 2021-03-19 NOTE — Care Management Important Message (Signed)
Important Message  Patient Details  Name: Kayla Harrison MRN: [id number] Date of Birth: 12-04-1945   Medicare Important Message Given:  Yes - Important Message mailed due to current National Emergency   Verbal consent obtained due to current National Emergency  Relationship to patient: Self Contact Name: [place] Call Date: 03/19/21  Time: 1448 Phone: [phone] Outcome: No Answer/Busy Important Message mailed to: Patient address on file    Delorse Lek 03/19/2021, 2:48 PM

## 2021-03-19 NOTE — Telephone Encounter (Signed)
CN telephoned [facility] [place] sister of patient to discuss sister's decision about patient's discharge.  [name] has telephone appoinment with social worker at 2 PM today to make final decision on discharge. Patient will need to be discharged to SNF as she will need fulltime care as a result of [place].  CN will follow up with [name] after her meeting with social worker at 2 PM.

## 2021-03-19 NOTE — Progress Notes (Signed)
PMR Admission Coordinator Pre-Admission Assessment   Patient: Kayla Harrison is an 75 y.o., female MRN: [id number] DOB: [birth year] Height: $RemoveBeforeDE'5\' 6"'lIEcvRbnylxzCkW$  (167.6 cm) Weight: 90.2 kg   Insurance Information HMO:     PPO:      PCP:      IPA:      80/20: yes     OTHER: PRIMARY: Medicare Part A and B      Policy#: [id number]      Subscriber: Pt.  Phone#: Verified online   04/30/5026  Fax#: Pre-Cert#:       Employer: Benefits:  Phone #:      Name: Eff. Date: Parts A ad B effective  Deduct: $1556      Out of Pocket Max:  None      Life Max: N/A  CIR: 100%      SNF: 100 days Outpatient: 80%     Co-Pay: 20% Home Health: 100%      Co-Pay: none DME: 80%     Co-Pay: 20% Providers: patient's choice  SECONDARY: [employer]      Policy#: [id number]       Financial Counselor:       Phone#:   The "Data Collection Information Summary" for patients in Inpatient Rehabilitation Facilities with attached "Privacy Act [place] Records" was provided and verbally reviewed with: Family Member   Emergency Contact Information Contact Information       Name Relation Home Work Mobile    [place] Sister     [phone]    Smith,[name] [phone]        [name] Friend     [phone]           Current Medical History  Patient Admitting Diagnosis: ICH/IVH History of Present Illness: Kayla Harrison is a 75 year old right-handed female well-known to rehab services from admission 02/13/2021 to 02/23/2021 for debility secondary to thigh wound hematoma status post I&D 02/06/2021 with wound VAC placement and drain as well as history of thyroid cancer, hypothyroidism, CVA,NHL/SVC syndrome/lung cancer radiation therapy as well as chemotherapy 2018 followed by Dr.Gudena, pacemaker placement for atrial fibrillation maintained on Eliquis followed by Dr. Caryl Comes, obesity status post lap band surgery hypertension hyperlipidemia and recurrent falls.  Per chart review patient lives alone  independent with assistive device 1 level apartment with level entry.  She does have good family support.  Presented 03/14/2021 with altered mental status found down after she reportedly slipped off the bed.  No loss of consciousness.  Admission chemistries urine drug screen negative, glucose 111, hemoglobin 10.6, SARS coronavirus negative, CK 41. Cranial CT scan showed acute intracranial hemorrhage extending from the region of the left caudothalamic groove into the left lateral ventricle and through the left foramen Mon[place] into the third ventricle.  There was mild dilation of the left lateral ventricle approxi-5 mm left to right midline shift.  Findings indicate of of hypertensive in nature or secondary to rupture of a-comm aneurysm.  CT angiogram of head and neck no large vessel occlusion.  No significant stenosis of the neck.  No aneurysm or visible vascular malformation in the region of the patient's known hemorrhage.  Neurology consulted and her Eliquis was reversed and and discontinued due to [place] and currently awaiting plan for possibly beginning Lovenox.  Echocardiogram completed showing ejection fraction of 55 to 60% no wall motion abnormalities.  In regards to patient's chronic left thigh wound status post wound VAC followed by Dr. Lucia Gaskins as well as the wound center  clinic Dr. Heber Garza and currently being followed by [facility] while in the hospital.  Therapy evaluations completed due to patient decreased functional mobility altered mental status recommendations of inpatient rehab services and patient was admitted for a comprehensive rehab program. Complete NIHSS TOTAL: 2   Patient's medical record from [place] has been reviewed by the rehabilitation admission coordinator and physician.   Past Medical History      Past Medical History:  Diagnosis Date   Anemia     Arthritis      osteoarthritis - knees and right shoulder   Blood transfusion without reported diagnosis     Breast cancer  ([place])      Dr Margot Chimes, total thyroidectomy- 1999- for cancer   Brucellosis 1964   Chronic bilateral pleural effusions     Colon polyp      Dr Earlean Shawl   Complication of anesthesia      Ketamine produces LSD reaction, bright colored nightmarish experience   Dyslipidemia     Endometriosis     Fibroids     H/O pleural effusion      s/p thoracentesis w 3286ml withdrawn   Hepatitis      [place], ?hepatitis   History of dysphagia      due to radiation therapy   History of hiatal hernia      small noted on PET scan   Hypertension     Hypothyroidism     Lung cancer, lower lobe ([place]) 01/2017    radiation RX completed 03/04/17; will start chemo 6/27, pt unaware of lung cancer   Morbid obesity ([place])      Status post lap band surgery   Nephrolithiasis     Non Hodgkin's lymphoma ([place])      on chemotherapy   Persistent atrial fibrillation ([place])      a. s/p PVI 2008 b. s/p convergent ablation 1194 complicated by bradycardia requiring pacemaker implant   Personal history of radiation therapy     Presence of permanent cardiac pacemaker     Rotator cuff tear      Right   Stroke ([place])      2003- [place] x2   SVC syndrome      with lung mass and non hodgkins lymphoma   Thyroid cancer ([place]) 2000      Family History   family history includes Colon cancer in her father; Diabetes in her brother, paternal aunt, paternal grandmother, and sister; Heart attack in her father; Heart disease (age of onset: 47) in her father; Other in her mother.   Prior Rehab/Hospitalizations Has the patient had prior rehab or hospitalizations prior to admission? Yes   Has the patient had major surgery during 100 days prior to admission? Yes              Current Medications   Current Facility-Administered Medications:   acetaminophen (TYLENOL) tablet 650 mg, 650 mg, Oral, Q4H PRN, 650 mg at 03/19/21 0840 **OR** acetaminophen (TYLENOL) 160 MG/5ML solution 650 mg, 650 mg, Per Tube,  Q4H PRN **OR** acetaminophen (TYLENOL) suppository 650 mg, 650 mg, Rectal, Q4H PRN, Greta Doom, MD   amiodarone (PACERONE) tablet 200 mg, 200 mg, Oral, BID, Greta Doom, MD, 200 mg at 03/19/21 0840   Chlorhexidine Gluconate Cloth 2 % PADS 6 each, 6 each, Topical, Q0600, Greta Doom, MD, 6 each at 03/17/21 0704   collagenase (SANTYL) ointment, , Topical, Daily, Kary Kos, MD, Given at  03/19/21 0841   labetalol (NORMODYNE) injection 10 mg, 10 mg, Intravenous, Q10 min PRN, Greta Doom, MD, 10 mg at 03/16/21 2051   levothyroxine (SYNTHROID) tablet 137 mcg, 137 mcg, Oral, Once per day on Sun Mon Tue Wed Thu Fri, Kerney Elbe, MD, 137 mcg at 03/19/21 6222   levothyroxine (SYNTHROID) tablet 68.5 mcg, 68.5 mcg, Oral, Once per day on Sat, Kirkpatrick, McNeill P, MD, 68.5 mcg at 03/17/21 9798   MEDLINE mouth rinse, 15 mL, Mouth Rinse, BID, Greta Doom, MD, 15 mL at 03/19/21 0841   pantoprazole (PROTONIX) EC tablet 40 mg, 40 mg, Oral, Daily, Henri Medal, RPH, 40 mg at 03/19/21 9211   senna-docusate (Senokot-S) tablet 1 tablet, 1 tablet, Oral, BID, Greta Doom, MD, 1 tablet at 03/19/21 0840   Patients Current Diet:  Diet Order                  Diet - low sodium heart healthy             Diet Heart Room service appropriate? Yes; Fluid consistency: Thin  Diet effective now                         Precautions / Restrictions Precautions Precautions: Fall Precaution Comments: L hip wound vac; SBP goal < 140 Restrictions Weight Bearing Restrictions: No LUE Weight Bearing: Touch down weight bearing LLE Weight Bearing: Weight bearing as tolerated    Has the patient had 2 or more falls or a fall with injury in the past year? Yes   Prior Activity Level Limited Community (1-2x/wk): Pt. went out a few times a week   Prior Functional Level Self Care: Did the patient need help bathing, dressing, using the toilet or eating?  Independent   Indoor Mobility: Did the patient need assistance with walking from room to room (with or without device)? Independent   Stairs: Did the patient need assistance with internal or external stairs (with or without device)? Independent   Functional Cognition: Did the patient need help planning regular tasks such as shopping or remembering to take medications? [place] / [place] Devices/Equipment: Cane (specify quad or straight), Eyeglasses, Environmental consultant (specify type) Home Equipment: Walker - 4 wheels, Shower seat - built in, [place] - single point, [employer] - tub/shower   Prior Device Use: Indicate devices/aids used by the patient prior to current illness, exacerbation or injury? Walker   Current Functional Level Cognition   Arousal/Alertness: Awake/alert Overall Cognitive Status: Impaired/Different from baseline Current Attention Level: Focused Orientation Level: Oriented to person, Oriented to place, Disoriented to time, Disoriented to situation Following Commands: Follows one step commands consistently, Follows one step commands with increased time, Follows multi-step commands inconsistently Safety/Judgement: Decreased awareness of deficits General Comments: Pt slow to process cues, needing repeated simple multi-modal cues to sequence all tasks. Pt with flat affect and appeared anxious with standing mobility. Needs reminders for proper posture and balance. Attention: Selective Selective Attention: Impaired Selective Attention Impairment: Verbal basic, Functional basic Memory: Impaired Memory Impairment: Retrieval deficit, Storage deficit, Decreased recall of new information Awareness: Impaired Awareness Impairment: Anticipatory impairment, Intellectual impairment (some emergent awareness of what she is having trouble with) Problem Solving: Impaired Problem Solving Impairment: Verbal basic, Functional basic Safety/Judgment: Impaired     Extremity Assessment (includes Sensation/Coordination)   Upper Extremity Assessment: Generalized weakness  Lower Extremity Assessment: Defer to PT evaluation RLE Deficits / Details: MMT scores of  the following: hip flexion 2+, knee extension 4+, knee flexion 4-, ankle dorsiflexion 4+ RLE Sensation: decreased light touch (at feet, reports present to incident resulting in admission) RLE Coordination: decreased gross motor LLE Deficits / Details: MMT scores of the following: hip flexion 3+, knee extension 4+, knee flexion 4, ankle dorsiflexion 4 LLE Sensation: decreased light touch (at feet, reports present to incident resulting in admission) LLE Coordination: decreased gross motor     ADLs   Overall ADL's : Needs assistance/impaired Upper Body Dressing : Minimal assistance, Sitting Lower Body Dressing: Moderate assistance, Sit to/from stand Functional mobility during ADLs: Cueing for safety, Cueing for sequencing, Rolling walker, Moderate assistance     Mobility   Overal bed mobility: Needs Assistance Bed Mobility: Supine to Sit Supine to sit: HOB elevated, Min assist General bed mobility comments: Cued pt to reach for L bed rail to assist with trunk ascension, minA to control legs descending off EOB as they would hurt with a quick descent.     Transfers   Overall transfer level: Needs assistance Equipment used: Rolling walker (2 wheeled) Transfers: Sit to/from Stand, [employer] Transfers Sit to Stand: Mod assist, From elevated surface Stand pivot transfers: Mod assist, From elevated surface General transfer comment: Sit to stand 1x from EOB and 1x from recliner, cuing for hand position and to rock to gain momentum, modA to power up, steady, and obtain upright posture and reduce posterior lean. ModA to stand step to L bed > recliner with RW, cuing for feet placement and RW management.     Ambulation / Gait / Stairs / Wheelchair Mobility   Ambulation/Gait Ambulation/Gait assistance:  Mod assist Gait Distance (Feet): 8 Feet (x2 bouts of ~3 ft > ~8 ft) Assistive device: Rolling walker (2 wheeled) Gait Pattern/deviations: Decreased stride length, Shuffle, Trunk flexed, Leaning posteriorly, Step-through pattern, Decreased stance time - right General Gait Details: Pt with trunk flexed and posterior lean, tactile and verbal cues to correct, mod success. Practiced lateral weight shifting and marching prior to stepping anteriorly. Improved weight shifting and leg advancement bil. Noted decreased stride length and bil feet clearance, needing visual cues to improve through cuing pt to step on PT's feet anterior to her, success noted. [place] for stability and guidance. Gait velocity: decreased Gait velocity interpretation: <1.31 ft/sec, indicative of household ambulator     Posture / Balance Dynamic Sitting Balance Sitting balance - Comments: Static sitting EOB with supervision. Balance Overall balance assessment: Needs assistance Sitting-balance support: No upper extremity supported, Feet supported Sitting balance-Leahy Scale: Fair Sitting balance - Comments: Static sitting EOB with supervision. Standing balance support: Bilateral upper extremity supported, During functional activity Standing balance-Leahy Scale: Poor Standing balance comment: Reliant on bil UE support and physical assistance.     Special needs/care consideration Skin ecchymosis to bilateral legs, blister to L thigh    Previous Home Environment (from acute therapy documentation) Living Arrangements: Alone  Lives With: Alone Available Help at Discharge: Family, Available PRN/intermittently Type of Home: Apartment Home Layout: One level Home Access: Level entry Bathroom Shower/Tub: Multimedia programmer: Standard Bathroom Accessibility: Yes How Accessible: Accessible via walker [place]: Yes Type of Home Care Services: [place] (if known): agency name  unknown Additional Comments: Pt has cat named "Callie".   Discharge Living Setting Plans for Discharge Living Setting: Patient's home Type of Home at Discharge: House Discharge Home Layout: One level Discharge Home Access: Level entry Discharge Bathroom Shower/Tub: Walk-in shower Discharge Bathroom Toilet:  Standard Discharge Bathroom Accessibility: Yes Does the patient have any problems obtaining your medications?: No   Social/Family/Support Systems Patient Roles: Other (Comment) Contact Information: [phone] Anticipated Caregiver's Contact Information: [phone] Caregiver Availability: 24/7 Discharge Plan Discussed with Primary Caregiver: Yes Is Caregiver In Agreement with Plan?: Yes Does Caregiver/Family have Issues with Lodging/Transportation while Pt is in Rehab?: Yes   Goals Patient/Family Goal for Rehab: PT/OT Min A, Mod I with SLP Expected length of stay: 14-16 days Pt/Family Agrees to Admission and willing to participate: Yes Program Orientation Provided & Reviewed with Pt/Caregiver Including Roles  & Responsibilities: Yes   Decrease burden of Care through IP rehab admission: Specialzed equipment needs, Decrease number of caregivers, Bowel and bladder program, and Patient/family education   Possible need for SNF placement upon discharge: not anticipated    Patient Condition: I have reviewed medical records from [facility], spoken with CM, and patient and family member. I met with patient at the bedside and discussed via phone for inpatient rehabilitation assessment.  Patient will benefit from ongoing PT, OT, and SLP, can actively participate in 3 hours of therapy a day 5 days of the week, and can make measurable gains during the admission.  Patient will also benefit from the coordinated team approach during an Inpatient Acute Rehabilitation admission.  The patient will receive intensive therapy as well as Rehabilitation physician, nursing, social  worker, and care management interventions.  Due to safety, skin/wound care, disease management, medication administration, pain management, and patient education the patient requires 24 hour a day rehabilitation nursing.  The patient is currently mod-max A with mobility and basic ADLs.  Discharge setting and therapy post discharge at home with home health is anticipated.  Patient has agreed to participate in the Acute Inpatient Rehabilitation Program and will admit today.   Preadmission Screen Completed By:  Genella Mech, 03/19/2021 2:37 PM ______________________________________________________________________   Discussed status with Dr. Naaman Plummer on 03/19/21 at  230pm and received approval for admission today.   Admission Coordinator:  Genella Mech, CCC-SLP, time 245pm/Date 03/19/21    Assessment/Plan: Diagnosis: left caudate ICH Does the need for close, 24 hr/day Medical supervision in concert with the patient's rehab needs make it unreasonable for this patient to be served in a less intensive setting? Yes Co-Morbidities requiring supervision/potential complications:  a fib, htn, large left thigh wound Due to bladder management, bowel management, safety, skin/wound care, disease management, medication administration, pain management, and patient education, does the patient require 24 hr/day rehab nursing? Yes Does the patient require coordinated care of a physician, rehab nurse, PT, OT, and SLP to address physical and functional deficits in the context of the above medical diagnosis(es)? Yes Addressing deficits in the following areas: balance, endurance, locomotion, strength, transferring, bowel/bladder control, bathing, dressing, feeding, grooming, toileting, cognition, speech, and psychosocial support Can the patient actively participate in an intensive therapy program of at least 3 hrs of therapy 5 days a week? Yes The potential for patient to make measurable gains while on inpatient rehab is  excellent Anticipated functional outcomes upon discharge from inpatient rehab: supervision and min assist PT, supervision and min assist OT, modified independent and supervision SLP Estimated rehab length of stay to reach the above functional goals is: 14-16 days Anticipated discharge destination: Home 10. Overall Rehab/Functional Prognosis: excellent     MD Signature: Meredith Staggers, MD, [facility] 03/19/2021

## 2021-03-19 NOTE — Progress Notes (Signed)
PROGRESS NOTE    Kayla Harrison  VQM:[id number] DOB: [birth year] DOA: 03/14/2021 PCP: [name], MD    Brief Narrative:  75 year old female with a history of atrial fibrillation on anticoagulation, presents to the hospital with subcortical hemorrhage.  This was felt to be related to hypotension in the setting of anticoagulation.  Patient received factor for concentrate reverse Eliquis.  She was admitted to the hospital for further work-up.  Transferred to [facility] service on 6/25. Currently awaiting plans on further disposition. She is otherwise stable for discharge   Assessment & Plan:   Active Problems:   HYPERLIPIDEMIA   Hypothyroidism   Persistent atrial fibrillation (HCC)   ICH (intracerebral hemorrhage) (HCC)   Anemia   Lower extremity edema   Intracranial hemorrhage (HCC)   Pressure injury of skin   Hypertension   Small left caudate head intracranial hemorrhage with small intraventricular hemorrhage -Secondary to hypertension and anticoagulation use -Unable to perform MRI due to pacemaker -Echo showed normal ejection fraction -LDL of 70 -A1c of 5.2 -No antiplatelet agents at this time due to bleed, will be readdressed on follow up with neuro -Seen by physical therapy/Occupational Therapy with recommendations for CIR -Patient sister is deciding on CIR vs. SNF and plans on discussing with TOC and IP rehab team today  Permanent atrial fibrillation -Chronically anticoagulated with Eliquis -This is been discontinued in light of intracranial hemorrhage -No antithrombotic agents due to bleed -Continue on amiodarone  Hypertension -Blood pressures are running on the lower side today.  We will discontinue lisinopril  Hyperlipidemia -Resume home dose of Crestor on discharge  Chronic anemia -Suspect is related to her chronic wounds -Baseline hemoglobin appears to be between 8 and 9 -Currently, hemoglobin appears at baseline, continue to follow  Chronic left thigh  wound s/p wound VAC -Patient is followed by orthopedics, Dr. Lucia Gaskins -She also follows with wound care center by Dr. Heber Cantrall -Appears that her wounds are clinically improving -No signs of active infection at this time -Wound care nurse discussed wound VAC with Dr. Lucia Gaskins who recommended that wound VAC can be discontinued.  Plan will be to discontinue wound VAC on 6/29 and changed to wet-to-dry dressings  DVT prophylaxis: SCD's Start: 03/15/21 0059  Code Status: Partial code, no intubation or BiPAP Family Communication: Discussed with patient Disposition Plan: Status is: Inpatient  Remains inpatient appropriate because:Altered mental status and Unsafe d/c plan  Dispo: The patient is from: Home              Anticipated d/c is to: CIR versus SNF              Patient currently is medically stable to d/c.   Difficult to place patient No         Consultants:  Neurology Neurosurgery  Procedures:    Antimicrobials:      Subjective: No new complaints today.  Objective: Vitals:   03/18/21 2021 03/19/21 0356 03/19/21 0800 03/19/21 1219  BP: 139/67 129/66 137/69 (!) 86/55  Pulse: 70 67 70 69  Resp: 18 18 18 18   Temp: 98 F (36.7 C) 98.1 F (36.7 C) 97.8 F (36.6 C) 97.7 F (36.5 C)  TempSrc: Oral Oral Oral Oral  SpO2: 96% 98% 97% 99%  Weight:      Height:        Intake/Output Summary (Last 24 hours) at 03/19/2021 1241 Last data filed at 03/19/2021 0800 Gross per 24 hour  Intake 240 ml  Output 500 ml  Net -260 ml  Filed Weights   03/15/21 0000  Weight: 90.2 kg    Examination:  General exam: Alert, awake, oriented x 3 Respiratory system: Clear to auscultation. Respiratory effort normal. Cardiovascular system:RRR. No murmurs, rubs, gallops. Gastrointestinal system: Abdomen is nondistended, soft and nontender. No organomegaly or masses felt. Normal bowel sounds heard. Central nervous system: Alert and oriented.  Bilateral lower extremity weakness  persists Extremities: No C/C/E, +pedal pulses Skin: No rashes, lesions or ulcers Psychiatry: Judgement and insight appear normal. Mood & affect appropriate.    Data Reviewed: I have personally reviewed following labs and imaging studies  CBC: Recent Labs  Lab 03/14/21 1743 03/17/21 0111 03/19/21 0246  WBC 12.2* 6.5 6.3  NEUTROABS 9.9*  --   --   HGB 10.6* 8.4* 9.2*  HCT 34.5* 27.7* 30.3*  MCV 89.6 88.5 87.8  PLT 254 220 211   Basic Metabolic Panel: Recent Labs  Lab 03/14/21 1743 03/17/21 0111 03/19/21 0246  NA 138 133* 132*  K 3.6 3.5 4.1  CL 100 101 99  CO2 29 26 24   GLUCOSE 111* 97 92  BUN 18 18 15   CREATININE 0.98 0.91 0.80  CALCIUM 9.0 8.3* 8.7*  MG  --   --  1.9   GFR: Estimated Creatinine Clearance: 67.7 mL/min (by C-G formula based on SCr of 0.8 mg/dL). Liver Function Tests: Recent Labs  Lab 03/14/21 1743  AST 27  ALT 23  ALKPHOS 97  BILITOT 1.0  PROT 7.0  ALBUMIN 3.7   No results for input(s): LIPASE, AMYLASE in the last 168 hours. No results for input(s): AMMONIA in the last 168 hours. Coagulation Profile: No results for input(s): INR, PROTIME in the last 168 hours. Cardiac Enzymes: Recent Labs  Lab 03/14/21 1743  CKTOTAL 50   BNP (last 3 results) No results for input(s): PROBNP in the last 8760 hours. HbA1C: Recent Labs    03/17/21 0111  HGBA1C 5.2   CBG: No results for input(s): GLUCAP in the last 168 hours. Lipid Profile: Recent Labs    03/17/21 0111  CHOL 120  HDL 36*  LDLCALC 70  TRIG 70  CHOLHDL 3.3   Thyroid Function Tests: No results for input(s): TSH, T4TOTAL, FREET4, T3FREE, THYROIDAB in the last 72 hours. Anemia Panel: No results for input(s): VITAMINB12, FOLATE, FERRITIN, TIBC, IRON, RETICCTPCT in the last 72 hours. Sepsis Labs: No results for input(s): PROCALCITON, LATICACIDVEN in the last 168 hours.  Recent Results (from the past 240 hour(s))  SARS CORONAVIRUS 2 (TAT 6-24 HRS) Nasopharyngeal Nasopharyngeal  Swab     Status: None   Collection Time: 03/14/21  9:03 PM   Specimen: Nasopharyngeal Swab  Result Value Ref Range Status   SARS Coronavirus 2 NEGATIVE NEGATIVE Final    Comment: (NOTE) SARS-CoV-2 target nucleic acids are NOT DETECTED.  The SARS-CoV-2 RNA is generally detectable in upper and lower respiratory specimens during the acute phase of infection. Negative results do not preclude SARS-CoV-2 infection, do not rule out co-infections with other pathogens, and should not be used as the sole basis for treatment or other patient management decisions. Negative results must be combined with clinical observations, patient history, and epidemiological information. The expected result is Negative.  Fact Sheet for Patients: SugarRoll.be  Fact Sheet for Healthcare Providers: https://www.woods-mathews.com/  This test is not yet approved or cleared by the [place] FDA and  has been authorized for detection and/or diagnosis of SARS-CoV-2 by FDA under an Emergency Use Authorization (EUA). This EUA will remain  in effect (  meaning this test can be used) for the duration of the COVID-19 declaration under Se ction 564(b)(1) of the Act, 21 U.S.C. section 360bbb-3(b)(1), unless the authorization is terminated or revoked sooner.  Performed at [facility], [place] [address] [place], [place] [address]   MRSA Next Gen by PCR, Nasal     Status: None   Collection Time: 03/14/21 11:35 PM   Specimen: Nasal Mucosa; Nasal Swab  Result Value Ref Range Status   MRSA by PCR Next Gen NOT DETECTED NOT DETECTED Final    Comment: (NOTE) The GeneXpert MRSA Assay (FDA approved for NASAL specimens only), is one component of a comprehensive MRSA colonization surveillance program. It is not intended to diagnose MRSA infection nor to guide or monitor treatment for MRSA infections. Test performance is not FDA approved in patients less than 23  years old. Performed at [facility], [place] [address], [place], [place] [address]          Radiology Studies: No results found.      Scheduled Meds:  amiodarone  200 mg Oral BID   Chlorhexidine Gluconate Cloth  6 each Topical Q0600   collagenase   Topical Daily   levothyroxine  137 mcg Oral Once per day on Sun Mon Tue Wed Thu Fri   levothyroxine  68.5 mcg Oral Once per day on Sat   mouth rinse  15 mL Mouth Rinse BID   pantoprazole  40 mg Oral Daily   senna-docusate  1 tablet Oral BID   Continuous Infusions:   LOS: 5 days    Time spent: 82mins    Kathie Dike, MD [facility]   If 7PM-7AM, please contact night-coverage [email or web address]  03/19/2021, 12:41 PM

## 2021-03-20 ENCOUNTER — Telehealth: Payer: Self-pay | Admitting: *Deleted

## 2021-03-20 ENCOUNTER — Encounter: Payer: Self-pay | Admitting: *Deleted

## 2021-03-20 LAB — CBC WITH DIFFERENTIAL/PLATELET
Abs Immature Granulocytes: 0.02 10*3/uL (ref 0.00–0.07)
Basophils Absolute: 0.1 10*3/uL (ref 0.0–0.1)
Basophils Relative: 1 %
Eosinophils Absolute: 0.2 10*3/uL (ref 0.0–0.5)
Eosinophils Relative: 4 %
HCT: 34.1 % — ABNORMAL LOW (ref 36.0–46.0)
Hemoglobin: 10.3 g/dL — ABNORMAL LOW (ref 12.0–15.0)
Immature Granulocytes: 0 %
Lymphocytes Relative: 13 %
Lymphs Abs: 0.7 10*3/uL (ref 0.7–4.0)
MCH: 26.8 pg (ref 26.0–34.0)
MCHC: 30.2 g/dL (ref 30.0–36.0)
MCV: 88.8 fL (ref 80.0–100.0)
Monocytes Absolute: 0.9 10*3/uL (ref 0.1–1.0)
Monocytes Relative: 17 %
Neutro Abs: 3.5 10*3/uL (ref 1.7–7.7)
Neutrophils Relative %: 65 %
Platelets: 205 10*3/uL (ref 150–400)
RBC: 3.84 MIL/uL — ABNORMAL LOW (ref 3.87–5.11)
RDW: 15.9 % — ABNORMAL HIGH (ref 11.5–15.5)
WBC: 5.4 10*3/uL (ref 4.0–10.5)
nRBC: 0 % (ref 0.0–0.2)

## 2021-03-20 LAB — COMPREHENSIVE METABOLIC PANEL
ALT: 36 U/L (ref 0–44)
AST: 52 U/L — ABNORMAL HIGH (ref 15–41)
Albumin: 2.5 g/dL — ABNORMAL LOW (ref 3.5–5.0)
Alkaline Phosphatase: 78 U/L (ref 38–126)
Anion gap: 14 (ref 5–15)
BUN: 17 mg/dL (ref 8–23)
CO2: 22 mmol/L (ref 22–32)
Calcium: 8.8 mg/dL — ABNORMAL LOW (ref 8.9–10.3)
Chloride: 99 mmol/L (ref 98–111)
Creatinine, Ser: 0.89 mg/dL (ref 0.44–1.00)
GFR, Estimated: >60 mL/min (ref >60)
Glucose, Bld: 101 mg/dL — ABNORMAL HIGH (ref 70–99)
Potassium: 3.8 mmol/L (ref 3.5–5.1)
Sodium: 135 mmol/L (ref 135–145)
Total Bilirubin: 0.8 mg/dL (ref 0.3–1.2)
Total Protein: 5.1 g/dL — ABNORMAL LOW (ref 6.5–8.1)

## 2021-03-20 MED ORDER — ROSUVASTATIN CALCIUM 5 MG PO TABS
10.0000 mg | ORAL_TABLET | Freq: Every day | ORAL | Status: DC
  Administered 2021-03-21 – 2021-04-03 (×14): 10 mg via ORAL
  Filled 2021-03-20 (×14): qty 2

## 2021-03-20 NOTE — Evaluation (Signed)
Physical Therapy Assessment and Plan  Patient Details  Name: Kayla Harrison MRN: [id number] Date of Birth: 1945-12-31  PT Diagnosis: Abnormality of gait, Difficulty walking, Hemiparesis dominant, Impaired cognition, and Muscle weakness Rehab Potential: Good ELOS: 14-17 days   Today's Date: 03/20/2021 PT Individual Time: 1333-1430 PT Individual Time Calculation (min): 57 min    Hospital Problem: Principal Problem:   ICH (intracerebral hemorrhage) ([place])   Past Medical History:  Past Medical History:  Diagnosis Date   Anemia    Arthritis    osteoarthritis - knees and right shoulder   Blood transfusion without reported diagnosis    Breast cancer ([place])    Dr Margot Chimes, total thyroidectomy- 1999- for cancer   Brucellosis 1964   Chronic bilateral pleural effusions    Colon polyp    Dr Earlean Shawl   Complication of anesthesia    Ketamine produces LSD reaction, bright colored nightmarish experience    Dyslipidemia    Endometriosis    Fibroids    H/O pleural effusion    s/p thoracentesis w 327ml withdrawn   Hepatitis    [place] ?hepatitis    History of dysphagia    due to radiation therapy   History of hiatal hernia    small noted on PET scan   Hypertension    Hypothyroidism    Lung cancer, lower lobe ([place]) 01/2017   radiation RX completed 03/04/17; will start chemo 6/27, pt unaware of lung cancer   Morbid obesity ([place])    Status post lap band surgery   Nephrolithiasis    Non Hodgkin's lymphoma ([place])    on chemotherapy   Persistent atrial fibrillation ([place])    a. s/p PVI 2008 b. s/p convergent ablation 8144 complicated by bradycardia requiring pacemaker implant   Personal history of radiation therapy    Presence of permanent cardiac pacemaker    Rotator cuff tear    Right   Stroke ([place])    2003- [place] x2   SVC syndrome    with lung mass and non hodgkins lymphoma   Thyroid cancer ([place]) 2000   Past Surgical History:  Past Surgical  History:  Procedure Laterality Date   ABDOMINAL HYSTERECTOMY  1983   afib ablation     a. 2008 PVI b. 2014 convergent ablation   APPENDECTOMY     BONE MARROW BIOPSY  02/21/2017   BREAST LUMPECTOMY Left 2010   [place]     2015- negative   CARDIOVERSION  10/09/2012   Procedure: CARDIOVERSION;  Surgeon: Minus Breeding, MD;  Location: [place];  Service: Cardiovascular;  Laterality: N/A;   CARDIOVERSION  10/09/2012   Procedure: CARDIOVERSION;  Surgeon: Minus Breeding, MD;  Location: [facility] ENDOSCOPY;  Service: Cardiovascular;  Laterality: N/A;  [name] gave the ok to add pt to the add on , but we must check to find out if the can add pt on at [phone] ( [phone])   CARDIOVERSION N/A 11/20/2012   Procedure: CARDIOVERSION;  Surgeon: Fay Records, MD;  Location: [place];  Service: Cardiovascular;  Laterality: N/A;   CARDIOVERSION N/A 07/18/2017   Procedure: CARDIOVERSION;  Surgeon: Thayer Headings, MD;  Location: [facility] ENDOSCOPY;  Service: Cardiovascular;  Laterality: N/A;   CARDIOVERSION N/A 10/03/2017   Procedure: CARDIOVERSION;  Surgeon: Sanda Klein, MD;  Location: [place] ENDOSCOPY;  Service: Cardiovascular;  Laterality: N/A;   CARDIOVERSION N/A 01/07/2018   Procedure: CARDIOVERSION;  Surgeon: Acie Fredrickson Wonda Cheng, MD;  Location: [facility]  ENDOSCOPY;  Service: Cardiovascular;  Laterality: N/A;   CARDIOVERSION N/A 12/10/2019   Procedure: CARDIOVERSION;  Surgeon: Buford Dresser, MD;  Location: [facility] ENDOSCOPY;  Service: Cardiovascular;  Laterality: N/A;   CARDIOVERSION N/A 03/09/2021   Procedure: CARDIOVERSION;  Surgeon: Pixie Casino, MD;  Location: [place];  Service: Cardiovascular;  Laterality: N/A;   CHOLECYSTECTOMY     COLONOSCOPY W/ POLYPECTOMY     Dr Earlean Shawl   CYSTOSCOPY N/A 02/06/2015   Procedure: CYSTOSCOPY;  Surgeon: Kathie Rhodes, MD;  Location: [facility];  Service: Urology;  Laterality: N/A;   CYSTOSCOPY W/ RETROGRADES Left 11/17/2017   Procedure: CYSTOSCOPY WITH RETROGRADE  /PYELOGRAM/;  Surgeon: Kathie Rhodes, MD;  Location: [facility];  Service: Urology;  Laterality: Left;   CYSTOSCOPY WITH RETROGRADE PYELOGRAM, URETEROSCOPY AND STENT PLACEMENT Right 02/06/2015   Procedure: RETROGRADE PYELOGRAM, RIGHT URETEROSCOPY STENT PLACEMENT;  Surgeon: Kathie Rhodes, MD;  Location: [facility];  Service: Urology;  Laterality: Right;   CYSTOSCOPY WITH RETROGRADE PYELOGRAM, URETEROSCOPY AND STENT PLACEMENT Right 03/07/2017   Procedure: CYSTOSCOPY WITH RIGHT RETROGRADE PYELOGRAM,RIGHT  URETEROSCOPYLASER LITHOTRIPSY  AND STENT PLACEMENT AND STONE BASKETRY;  Surgeon: Kathie Rhodes, MD;  Location: [place];  Service: Urology;  Laterality: Right;   EYE SURGERY     cataract surgery   fatty mass removal  1999   pubic area   HOLMIUM LASER APPLICATION N/A 9/48/3475   Procedure: HOLMIUM LASER APPLICATION;  Surgeon: Kathie Rhodes, MD;  Location: [facility];  Service: Urology;  Laterality: N/A;   HOLMIUM LASER APPLICATION Right 05/23/7459   Procedure: HOLMIUM LASER APPLICATION;  Surgeon: Kathie Rhodes, MD;  Location: [facility];  Service: Urology;  Laterality: Right;   HOLMIUM LASER APPLICATION Left 0/29/8473   Procedure: HOLMIUM LASER APPLICATION;  Surgeon: Kathie Rhodes, MD;  [facility];  Service: Urology;  Laterality: Left;   I & D EXTREMITY Left 12/19/2020   Procedure: IRRIGATION AND DEBRIDEMENT OF LEFT HIP HEMATOMA WITH APPLICATION OF WOUND VAC;  Surgeon: Erle Crocker, MD;  Location: [place];  Service: Orthopedics;  Laterality: Left;   I & D EXTREMITY Left 02/06/2021   Procedure: IRRIGATION AND DEBRIDEMENT DEEP ABCESS LEFT THIGH, SECONDARY CLOSURE OF WOUND DEHISCENCE;  Surgeon: Erle Crocker, MD;  Location: [place];  Service: Orthopedics;  Laterality: Left;   IR FLUORO GUIDE PORT INSERTION RIGHT  02/24/2017   IR NEPHROSTOMY PLACEMENT RIGHT  11/17/2017   IR PATIENT EVAL TECH 0-60 MINS  03/11/2017   IR REMOVAL TUN ACCESS W/ PORT W/O FL MOD SED  04/20/2018    IR US GUIDE VASC ACCESS RIGHT  02/24/2017   KNEE ARTHROSCOPY     bilateral   LAPAROSCOPIC GASTRIC BANDING  07/10/2010   LAPAROSCOPIC GASTRIC BANDING     Laparoscopic adjustable banding APS System with posterior hiatal hernia, 2 suture.   LAPAROTOMY     for ruptured ovary and ovarian artery    NEPHROLITHOTOMY Right 11/17/2017   Procedure: NEPHROLITHOTOMY PERCUTANEOUS;  Surgeon: Kathie Rhodes, MD;  [facility];  Service: Urology;  Laterality: Right;   PACEMAKER INSERTION  03/10/2013   MDT dual chamber PPM   POCKET REVISION N/A 12/08/2013   Procedure: POCKET REVISION;  Surgeon: Deboraha Sprang, MD;  Location: [facility] CATH LAB;  Service: Cardiovascular;  Laterality: N/A;   PORTA CATH INSERTION     REVERSE SHOULDER ARTHROPLASTY Right 05/14/2018   Procedure: RIGHT REVERSE SHOULDER ARTHROPLASTY;  Surgeon: Tania Ade, MD;  Location: [place];  Service: Orthopedics;  Laterality: Right;  REVERSE SHOULDER REPLACEMENT Right 05/14/2018   RIGHT HEART CATH N/A 07/21/2019   Procedure: RIGHT HEART CATH;  Surgeon: Larey Dresser, MD;  Location: [place] CV LAB;  Service: Cardiovascular;  Laterality: N/A;   TEE WITH CARDIOVERSION  09/22/2017   TEE WITHOUT CARDIOVERSION N/A 10/03/2017   Procedure: TRANSESOPHAGEAL ECHOCARDIOGRAM (TEE);  Surgeon: Sanda Klein, MD;  Location: [facility] ENDOSCOPY;  Service: Cardiovascular;  Laterality: N/A;   TEE WITHOUT CARDIOVERSION N/A 08/04/2019   Procedure: TRANSESOPHAGEAL ECHOCARDIOGRAM (TEE);  Surgeon: Larey Dresser, MD;  Location: [facility] ENDOSCOPY;  Service: Cardiovascular;  Laterality: N/A;   TEE WITHOUT CARDIOVERSION N/A 12/10/2019   Procedure: TRANSESOPHAGEAL ECHOCARDIOGRAM (TEE);  Surgeon: Buford Dresser, MD;  [facility] ENDOSCOPY;  Service: Cardiovascular;  Laterality: N/A;   TEE WITHOUT CARDIOVERSION N/A 03/09/2021   Procedure: TRANSESOPHAGEAL ECHOCARDIOGRAM (TEE);  Surgeon: Pixie Casino, MD;  Location: [place];  Service: Cardiovascular;  Laterality:  N/A;   THYROIDECTOMY  1998   Dr Margot Chimes   TONSILLECTOMY     TOTAL KNEE ARTHROPLASTY  04/13/2012   Procedure: TOTAL KNEE ARTHROPLASTY;  Surgeon: Rudean Haskell, MD;  Location: [place];  Service: Orthopedics;  Laterality: Right;   VIDEO BRONCHOSCOPY WITH ENDOBRONCHIAL ULTRASOUND N/A 02/07/2017   Procedure: VIDEO BRONCHOSCOPY WITH ENDOBRONCHIAL ULTRASOUND;  Surgeon: Marshell Garfinkel, MD;  Location: [place];  Service: Pulmonary;  Laterality: N/A;    Assessment & Plan Clinical Impression: ARIZBETH CAWTHORN is a 75 year old right-handed female well-known to rehab services from admission 02/13/2021 to 02/23/2021 for debility secondary to thigh wound hematoma status post I&D 02/06/2021 with wound VAC placement and drain as well as history of thyroid cancer, hypothyroidism, CVA,NHL/SVC syndrome/lung cancer radiation therapy as well as chemotherapy 2018 followed by Dr.Gudena, pacemaker placement for atrial fibrillation maintained on Eliquis followed by Dr. Caryl Comes, obesity status post lap band surgery hypertension hyperlipidemia and recurrent falls.  Per chart review patient lives alone independent with assistive device 1 level apartment with level entry.  She does have good family support.  Presented 03/14/2021 with altered mental status found down after she reportedly slipped off the bed.  No loss of consciousness.  Admission chemistries urine drug screen negative, glucose 111, hemoglobin 10.6, SARS coronavirus negative, CK 41. Cranial CT scan showed acute intracranial hemorrhage extending from the region of the left caudothalamic groove into the left lateral ventricle and through the left foramen Monro into the third ventricle.  There was mild dilation of the left lateral ventricle approxi-5 mm left to right midline shift.  Findings indicate of of hypertensive in nature or secondary to rupture of a-comm aneurysm.  CT angiogram of head and neck no large vessel occlusion.  No significant stenosis of the neck.  No aneurysm or  visible vascular malformation in the region of the patient's known hemorrhage.  Neurology consulted and her Eliquis was reversed and and discontinued due to [place] and currently awaiting plan for possibly beginning Lovenox.  Echocardiogram completed showing ejection fraction of 55 to 60% no wall motion abnormalities.  In regards to patient's chronic left thigh wound status post wound VAC followed by Dr. Lucia Gaskins as well as the wound center clinic Dr. Heber Lovejoy and currently being followed by [facility] while in the hospital.  Therapy evaluations completed due to patient decreased functional mobility altered mental status recommendations of inpatient rehab services and patient was admitted for a comprehensive rehab program.  Patient transferred to CIR on 03/19/2021 .   Patient currently requires mod with mobility secondary to muscle weakness, decreased coordination, decreased  attention, decreased problem solving, decreased safety awareness, and decreased memory, and decreased standing balance, decreased postural control, hemiplegia, and decreased balance strategies.  Prior to hospitalization, patient was modified independent  with mobility and lived with Alone in a [place] home.  Home access is  Level entry.  Patient will benefit from skilled PT intervention to maximize safe functional mobility, minimize fall risk, and decrease caregiver burden for planned discharge home with 24 hour supervision.  Anticipate patient will benefit from follow up [place] at discharge.  PT - End of Session Activity Tolerance: Tolerates 30+ min activity with multiple rests Endurance Deficit: Yes Endurance Deficit Description: required frequent rest breaks PT Assessment Rehab Potential (ACUTE/IP ONLY): Good PT Barriers to Discharge: Decreased caregiver support;Lack of/limited family support;Wound Care PT Barriers to Discharge Comments: orthostatic on eval, lives alone PT Patient demonstrates impairments in the following area(s):  Balance;Pain;Endurance;Motor;Skin Integrity;Edema;Safety PT Transfers Functional Problem(s): Bed Mobility;Bed to Chair;Car PT Locomotion Functional Problem(s): Ambulation;Wheelchair Mobility;Stairs PT Plan PT Intensity: Minimum of 1-2 x/day ,45 to 90 minutes PT Frequency: 5 out of 7 days PT Duration Estimated Length of Stay: 14-17 days PT Treatment/Interventions: Ambulation/gait training;Community reintegration;DME/adaptive equipment instruction;Neuromuscular re-education;Psychosocial support;Stair training;UE/LE Strength taining/ROM;Wheelchair propulsion/positioning;UE/LE Coordination activities;Therapeutic Activities;Skin care/wound management;Functional electrical stimulation;Pain management;Discharge planning;Balance/vestibular training;Disease management/prevention;Functional mobility training;Patient/family education;Splinting/orthotics;Therapeutic Exercise;Cognitive remediation/compensation PT Transfers Anticipated Outcome(s): S PT Locomotion Anticipated Outcome(s): S PT Recommendation Follow Up Recommendations: Home health PT;24 hour supervision/assistance Patient destination: Home (unless will not have support, pt unsure) Equipment Recommended: To be determined Equipment Details: has rollator   PT Evaluation Precautions/Restrictions Precautions Precautions: Fall Precaution Comments: L hip wound vac, watch for orthostatic hypotension, contact precautions Restrictions Weight Bearing Restrictions: No General   Vital Signs Pain Pain Assessment Pain Scale: 0-10 Pain Score:  (unrated) Faces Pain Scale: Hurts even more Pain Type: Acute pain Pain Location: Leg Pain Orientation: Right Pain Descriptors / Indicators: Aching;Sharp Pain Onset: With Activity Pain Intervention(s): Repositioned;Distraction;Rest Multiple Pain Sites: No Home Living/Prior Functioning Home Living Available Help at Discharge: Family;Available PRN/intermittently Type of Home: Apartment Home Access: Level  entry Home Layout: One level Bathroom Shower/Tub: Multimedia programmer: Standard Bathroom Accessibility: Yes Additional Comments: Pt has cat named "Callie".  Lives With: Alone Prior Function Level of Independence: Requires assistive device for independence  Able to Take Stairs?: No Driving: No Vocation: Retired Comments: mod I with rollator.  States she was able to care for meds, meals and basic home management.  Has not driven in a few months. Vision/Perception  Perception Perception: Within Functional Limits Praxis Praxis: Intact  Cognition Overall Cognitive Status: Impaired/Different from baseline Arousal/Alertness: Awake/alert Orientation Level: Oriented X4 Attention: Selective Sustained Attention: Appears intact Selective Attention: Impaired Selective Attention Impairment: Verbal basic;Functional basic Memory: Impaired Memory Impairment: Retrieval deficit;Storage deficit;Decreased recall of new information;Decreased short term memory Decreased Short Term Memory: Verbal basic;Functional basic Immediate Memory Recall: Sock;Blue;Bed Memory Recall Sock: Not able to recall Memory Recall Blue: With Cue Memory Recall Bed: With Cue Awareness: Impaired Awareness Impairment: Intellectual impairment Problem Solving: Impaired Problem Solving Impairment: Verbal basic;Functional basic Safety/Judgment: Impaired Comments: Flat affect throughout session Sensation Sensation Light Touch: Impaired by gross assessment Hot/Cold: Appears Intact Proprioception: Appears Intact Stereognosis: Appears Intact Additional Comments: Pt reports altered sensation along L/R LEs Coordination Gross Motor Movements are Fluid and Coordinated: No Fine Motor Movements are Fluid and Coordinated: No Coordination and Movement Description: intact with FNF, Pron/Sup and toe taps, but difficulty lifting legs for heel to shin due to weakness and girth Finger Nose Finger Test: [facility] bilaterally,  slight  issues following commands likely more cognitive Motor  Motor Motor: Hemiplegia Motor - Skilled Clinical Observations: R > L weakness and deconditioning   Trunk/Postural Assessment  Cervical Assessment Cervical Assessment: Exceptions to [facility] (forward head) Thoracic Assessment Thoracic Assessment: Exceptions to [facility] (rounded shoulders) Lumbar Assessment Lumbar Assessment: Exceptions to [facility] (weak core with PPT in sitting) Postural Control Postural Control: Deficits on evaluation Trunk Control: decreased trunk control in sitting balance unsupported and during standing with posterior leaning Protective Responses: posterior bias at times  Balance Balance Balance Assessed: Yes Static Sitting Balance Static Sitting - Balance Support: Feet supported;No upper extremity supported Static Sitting - Level of Assistance: 5: Stand by assistance Dynamic Sitting Balance Dynamic Sitting - Balance Support: Feet supported;Right upper extremity supported;Left upper extremity supported Dynamic Sitting - Level of Assistance: 5: Stand by assistance Dynamic Sitting - Balance Activities: Reaching for objects;Forward lean/weight shifting Sitting balance - Comments: Static sitting EOB with supervision, reaching for dressing items sitting on BSC, reaching to wash BLEs. Static Standing Balance Static Standing - Balance Support: Bilateral upper extremity supported Static Standing - Level of Assistance: 4: Min assist Static Standing - Comment/# of Minutes: standing EOB preparing for stand pivot transfer Dynamic Standing Balance Dynamic Standing - Balance Support: Bilateral upper extremity supported Dynamic Standing - Level of Assistance: 3: Mod assist Dynamic Standing - Balance Activities: Lateral lean/weight shifting Dynamic Standing - Comments: during stand pivot transfer Extremity Assessment  RUE Assessment RUE Assessment: Within Functional Limits Active Range of Motion (AROM) Comments: WFL General Strength  Comments: RUE FMC and strength slightly decreased, but WFL LUE Assessment LUE Assessment: Within Functional Limits RLE Assessment RLE Assessment: Exceptions to [facility] Passive Range of Motion (PROM) Comments: limited knee AROM due to pain and bruising/edema from fall Active Range of Motion (AROM) Comments: as above and limited hip flexion due to weakness General Strength Comments: hip flexion 2/5, knee extension 3+/5, ankle DF 3+/5 LLE Assessment LLE Assessment: Exceptions to [facility] Active Range of Motion (AROM) Comments: generally WFL but some limitations from girth of legs General Strength Comments: hip flexion 3+/5, knee extension 4/5, ankle DF 4/5  Care Tool Care Tool Bed Mobility Roll left and right activity   Roll left and right assist level: Minimal Assistance - Patient > 75%    Sit to lying activity   Sit to lying assist level: Minimal Assistance - Patient > 75%    Lying to sitting edge of bed activity   Lying to sitting edge of bed assist level: Contact Guard/Touching assist     Care Tool Transfers Sit to stand transfer   Sit to stand assist level: Moderate Assistance - Patient 50 - 74% Sit to stand assistive device: Armrests;Walker  Chair/bed transfer   Chair/bed transfer assist level: Minimal Assistance - Patient > 75% Chair/bed transfer assistive device: Barrister's clerk transfer   Assist Level: Moderate Assistance - Patient 50 - 74%    Car transfer Car transfer activity did not occur: Safety/medical concerns        Care Tool Locomotion Ambulation   Assist level: Moderate Assistance - Patient 50 - 74% Assistive device: Walker-rolling Max distance: 30'  Walk 10 feet activity   Assist level: Moderate Assistance - Patient - 50 - 74% Assistive device: Walker-rolling   Walk 50 feet with 2 turns activity Walk 50 feet with 2 turns activity did not occur: Safety/medical concerns      Walk 150 feet activity Walk 150 feet activity did not occur: Safety/medical concerns  Walk 10 feet on uneven surfaces activity Walk 10 feet on uneven surfaces activity did not occur: Safety/medical concerns      Stairs Stair activity did not occur: Safety/medical concerns        Walk up/down 1 step activity Walk up/down 1 step or curb (drop down) activity did not occur: Safety/medical concerns     Walk up/down 4 steps activity did not occuR: Safety/medical concerns  Walk up/down 4 steps activity      Walk up/down 12 steps activity Walk up/down 12 steps activity did not occur: Safety/medical concerns      Pick up small objects from floor Pick up small object from the floor (from standing position) activity did not occur: Safety/medical concerns      Wheelchair Will patient use wheelchair at discharge?: No   Wheelchair activity did not occur: Safety/medical concerns      Wheel 50 feet with 2 turns activity Wheelchair 50 feet with 2 turns activity did not occur: Safety/medical concerns    Wheel 150 feet activity Wheelchair 150 feet activity did not occur: Safety/medical concerns      Refer to Care Plan for Long Term Goals  SHORT TERM GOAL WEEK 1 PT Short Term Goal 1 (Week 1): Patient will perform bed mobility with S PT Short Term Goal 2 (Week 1): Patient will transfer sit to stand with CGA. PT Short Term Goal 3 (Week 1): Patient will ambulate x 100' with min A. PT Short Term Goal 4 (Week 1): Patient negotiate at least 1 step with min A.  Recommendations for other services: Neuropsych  Skilled Therapeutic Intervention Patient in supine and reports remembers me from prior stay but not sure how she got here.  Did recall she had fallen.  Discussed plan for mobility assessment.  Donned pants in supine with max A.  Supine to sit with CGA and rail and time.  Patient sit to stand to RW with mod A for some lifting help and A to finish donning pants.  Patient ambulated with RW to sink x about 24' with mod A for safety and w/c follow due to weakness and imbalance.    Performed orthostatic assessment in room :  Orthostatic VS for the past 24 hrs (Last 3 readings):  BP- Sitting Pulse- Sitting BP- Standing at 0 minutes Pulse- Standing at 0 minutes  03/20/21 1356 108/79 70 (!) 87/42 69   Patient in w/c assisted to therapy gym.  Discussed with PA orthostatic symptoms and approved use of TED's if pt tolerates.  Applied knee hi TEDs with some difficulty as R knee pain evident and with edema TEDs rolling down.  Patient assisted to ambulate with RW and w/c follow x 30' with min to mod A for safety.  Patient performed stand pivot to w/c with RW with min A.  Assisted in w/c to room and handoff to SLP for assessment.   Mobility Bed Mobility Bed Mobility: Rolling Right;Rolling Left;Sit to Supine;Supine to Sit;Scooting to [facility] Rolling Right: Minimal Assistance - Patient > 75% Rolling Left: Minimal Assistance - Patient > 75% Supine to Sit: Contact Guard/Touching assist Sit to Supine: Minimal Assistance - Patient > 75% Scooting to HOB: Moderate Assistance - Patient 50-74% Transfers Transfers: Sit to Stand;Stand Pivot Transfers Sit to Stand: Moderate Assistance - Patient 50-74% Stand to Sit: Minimal Assistance - Patient > 75% Stand Pivot Transfers: Minimal Assistance - Patient > 75% Stand Pivot Transfer Details: Verbal cues for precautions/safety;Verbal cues for safe use of DME/AE Transfer (Assistive device): Rolling  walker Locomotion  Gait Ambulation: Yes Gait Assistance: Moderate Assistance - Patient 50-74% Gait Distance (Feet): 30 Feet Assistive device: Rolling walker Gait Assistance Details: Verbal cues for precautions/safety;Verbal cues for safe use of DME/AE Gait Gait: Yes Gait Pattern: Impaired Gait Pattern: Step-through pattern;Decreased hip/knee flexion - left;Decreased hip/knee flexion - right;Decreased stride length Stairs / Additional Locomotion Stairs: No Wheelchair Mobility Wheelchair Mobility: No   Discharge Criteria: Patient will be discharged  from PT if patient refuses treatment 3 consecutive times without medical reason, if treatment goals not met, if there is a change in medical status, if patient makes no progress towards goals or if patient is discharged from hospital.  The above assessment, treatment plan, treatment alternatives and goals were discussed and mutually agreed upon: by patient  Jamison Oka, PT 03/20/2021, 6:53 PM

## 2021-03-20 NOTE — Progress Notes (Signed)
Inpatient Rehabilitation  Patient information reviewed and entered into eRehab system by Prudencio Velazco Zeya Balles, OTR/L.   Information including medical coding, functional ability and quality indicators will be reviewed and updated through discharge.    

## 2021-03-20 NOTE — Progress Notes (Signed)
PT measured pt for TED hoses. Size= XL short

## 2021-03-20 NOTE — Evaluation (Signed)
Speech Language Pathology Assessment and Plan  Patient Details  Name: Kayla Harrison MRN: [id number] Date of Birth: [birth year]  SLP Diagnosis: Cognitive Impairments  Rehab Potential: Excellent ELOS: 2-2.5 weeks    Today's Date: 03/20/2021 SLP Individual Time: 1435-1500 SLP Individual Time Calculation (min): 25 min   Hospital Problem: Principal Problem:   ICH (intracerebral hemorrhage) ([place])  Past Medical History:  Past Medical History:  Diagnosis Date   Anemia    Arthritis    osteoarthritis - knees and right shoulder   Blood transfusion without reported diagnosis    Breast cancer ([place])    Dr Margot Chimes, total thyroidectomy- 1999- for cancer   Brucellosis 1964   Chronic bilateral pleural effusions    Colon polyp    Dr Earlean Shawl   Complication of anesthesia    Ketamine produces LSD reaction, bright colored nightmarish experience    Dyslipidemia    Endometriosis    Fibroids    H/O pleural effusion    s/p thoracentesis w 3272ml withdrawn   Hepatitis    [place] ?hepatitis    History of dysphagia    due to radiation therapy   History of hiatal hernia    small noted on PET scan   Hypertension    Hypothyroidism    Lung cancer, lower lobe ([place]) 01/2017   radiation RX completed 03/04/17; will start chemo 6/27, pt unaware of lung cancer   Morbid obesity ([place])    Status post lap band surgery   Nephrolithiasis    Non Hodgkin's lymphoma ([place])    on chemotherapy   Persistent atrial fibrillation ([place])    a. s/p PVI 2008 b. s/p convergent ablation 4854 complicated by bradycardia requiring pacemaker implant   Personal history of radiation therapy    Presence of permanent cardiac pacemaker    Rotator cuff tear    Right   Stroke ([place])    2003- [place] x2   SVC syndrome    with lung mass and non hodgkins lymphoma   Thyroid cancer ([place]) 2000   Past Surgical History:  Past Surgical History:  Procedure Laterality Date   ABDOMINAL  HYSTERECTOMY  1983   afib ablation     a. 2008 PVI b. 2014 convergent ablation   APPENDECTOMY     BONE MARROW BIOPSY  02/21/2017   BREAST LUMPECTOMY Left 2010   [place]     2015- negative   CARDIOVERSION  10/09/2012   Procedure: CARDIOVERSION;  Surgeon: Minus Breeding, MD;  Location: [place];  Service: Cardiovascular;  Laterality: N/A;   CARDIOVERSION  10/09/2012   Procedure: CARDIOVERSION;  Surgeon: Minus Breeding, MD;  Location: [facility] ENDOSCOPY;  Service: Cardiovascular;  Laterality: N/A;  [name] gave the ok to add pt to the add on , but we must check to find out if the can add pt on at 1400 ( [phone])   CARDIOVERSION N/A 11/20/2012   Procedure: CARDIOVERSION;  Surgeon: Fay Records, MD;  Location: [facility] ENDOSCOPY;  Service: Cardiovascular;  Laterality: N/A;   CARDIOVERSION N/A 07/18/2017   Procedure: CARDIOVERSION;  Surgeon: Thayer Headings, MD;  Location: [facility] ENDOSCOPY;  Service: Cardiovascular;  Laterality: N/A;   CARDIOVERSION N/A 10/03/2017   Procedure: CARDIOVERSION;  Surgeon: Sanda Klein, MD;  Location: [place] ENDOSCOPY;  Service: Cardiovascular;  Laterality: N/A;   CARDIOVERSION N/A 01/07/2018   Procedure: CARDIOVERSION;  Surgeon: Nahser, Wonda Cheng, MD;  Location: [facility] ENDOSCOPY;  Service: Cardiovascular;  Laterality: N/A;  CARDIOVERSION N/A 12/10/2019   Procedure: CARDIOVERSION;  Surgeon: Buford Dresser, MD;  Location: [facility] ENDOSCOPY;  Service: Cardiovascular;  Laterality: N/A;   CARDIOVERSION N/A 03/09/2021   Procedure: CARDIOVERSION;  Surgeon: Pixie Casino, MD;  Location: [place];  Service: Cardiovascular;  Laterality: N/A;   CHOLECYSTECTOMY     COLONOSCOPY W/ POLYPECTOMY     Dr Earlean Shawl   CYSTOSCOPY N/A 02/06/2015   Procedure: CYSTOSCOPY;  Surgeon: Kathie Rhodes, MD;  Location: [facility];  Service: Urology;  Laterality: N/A;   CYSTOSCOPY W/ RETROGRADES Left 11/17/2017   Procedure: CYSTOSCOPY WITH RETROGRADE /PYELOGRAM/;  Surgeon: Kathie Rhodes, MD;   Location: [facility];  Service: Urology;  Laterality: Left;   CYSTOSCOPY WITH RETROGRADE PYELOGRAM, URETEROSCOPY AND STENT PLACEMENT Right 02/06/2015   Procedure: RETROGRADE PYELOGRAM, RIGHT URETEROSCOPY STENT PLACEMENT;  Surgeon: Kathie Rhodes, MD;  Location: [facility];  Service: Urology;  Laterality: Right;   CYSTOSCOPY WITH RETROGRADE PYELOGRAM, URETEROSCOPY AND STENT PLACEMENT Right 03/07/2017   Procedure: CYSTOSCOPY WITH RIGHT RETROGRADE PYELOGRAM,RIGHT  URETEROSCOPYLASER LITHOTRIPSY  AND STENT PLACEMENT AND STONE BASKETRY;  Surgeon: Kathie Rhodes, MD;  Location: [place];  Service: Urology;  Laterality: Right;   EYE SURGERY     cataract surgery   fatty mass removal  1999   pubic area   HOLMIUM LASER APPLICATION N/A 8/78/6767   Procedure: HOLMIUM LASER APPLICATION;  Surgeon: Kathie Rhodes, MD;  Location: [facility];  Service: Urology;  Laterality: N/A;   HOLMIUM LASER APPLICATION Right 11/02/4707   Procedure: HOLMIUM LASER APPLICATION;  Surgeon: Kathie Rhodes, MD;  Location: [facility];  Service: Urology;  Laterality: Right;   HOLMIUM LASER APPLICATION Left 03/20/3661   Procedure: HOLMIUM LASER APPLICATION;  Surgeon: Kathie Rhodes, MD;  Location: [facility];  Service: Urology;  Laterality: Left;   I & D EXTREMITY Left 12/19/2020   Procedure: IRRIGATION AND DEBRIDEMENT OF LEFT HIP HEMATOMA WITH APPLICATION OF WOUND VAC;  Surgeon: Erle Crocker, MD;  Location: [place];  Service: Orthopedics;  Laterality: Left;   I & D EXTREMITY Left 02/06/2021   Procedure: IRRIGATION AND DEBRIDEMENT DEEP ABCESS LEFT THIGH, SECONDARY CLOSURE OF WOUND DEHISCENCE;  Surgeon: Erle Crocker, MD;  Location: [place];  Service: Orthopedics;  Laterality: Left;   IR FLUORO GUIDE PORT INSERTION RIGHT  02/24/2017   IR NEPHROSTOMY PLACEMENT RIGHT  11/17/2017   IR PATIENT EVAL TECH 0-60 MINS  03/11/2017   IR REMOVAL TUN ACCESS W/ PORT W/O FL MOD SED  04/20/2018   IR US GUIDE VASC ACCESS RIGHT  02/24/2017    KNEE ARTHROSCOPY     bilateral   LAPAROSCOPIC GASTRIC BANDING  07/10/2010   LAPAROSCOPIC GASTRIC BANDING     Laparoscopic adjustable banding APS System with posterior hiatal hernia, 2 suture.   LAPAROTOMY     for ruptured ovary and ovarian artery    NEPHROLITHOTOMY Right 11/17/2017   Procedure: NEPHROLITHOTOMY PERCUTANEOUS;  Surgeon: Kathie Rhodes, MD;  [facility];  Service: Urology;  Laterality: Right;   PACEMAKER INSERTION  03/10/2013   MDT dual chamber PPM   POCKET REVISION N/A 12/08/2013   Procedure: POCKET REVISION;  Surgeon: Deboraha Sprang, MD;  Location: [facility] CATH LAB;  Service: Cardiovascular;  Laterality: N/A;   PORTA CATH INSERTION     REVERSE SHOULDER ARTHROPLASTY Right 05/14/2018   Procedure: RIGHT REVERSE SHOULDER ARTHROPLASTY;  Surgeon: Tania Ade, MD;  Location: [place];  Service: Orthopedics;  Laterality: Right;   REVERSE SHOULDER REPLACEMENT Right 05/14/2018   RIGHT HEART  CATH N/A 07/21/2019   Procedure: RIGHT HEART CATH;  Surgeon: Larey Dresser, MD;  Location: [place] CV LAB;  Service: Cardiovascular;  Laterality: N/A;   TEE WITH CARDIOVERSION  09/22/2017   TEE WITHOUT CARDIOVERSION N/A 10/03/2017   Procedure: TRANSESOPHAGEAL ECHOCARDIOGRAM (TEE);  Surgeon: Sanda Klein, MD;  Location: [facility] ENDOSCOPY;  Service: Cardiovascular;  Laterality: N/A;   TEE WITHOUT CARDIOVERSION N/A 08/04/2019   Procedure: TRANSESOPHAGEAL ECHOCARDIOGRAM (TEE);  Surgeon: Larey Dresser, MD;  Location: [facility] ENDOSCOPY;  Service: Cardiovascular;  Laterality: N/A;   TEE WITHOUT CARDIOVERSION N/A 12/10/2019   Procedure: TRANSESOPHAGEAL ECHOCARDIOGRAM (TEE);  Surgeon: Buford Dresser, MD;  Location: [facility] ENDOSCOPY;  Service: Cardiovascular;  Laterality: N/A;   TEE WITHOUT CARDIOVERSION N/A 03/09/2021   Procedure: TRANSESOPHAGEAL ECHOCARDIOGRAM (TEE);  Surgeon: Pixie Casino, MD;  Location: [place];  Service: Cardiovascular;  Laterality: N/A;   THYROIDECTOMY  1998   Dr Margot Chimes    TONSILLECTOMY     TOTAL KNEE ARTHROPLASTY  04/13/2012   Procedure: TOTAL KNEE ARTHROPLASTY;  Surgeon: Rudean Haskell, MD;  Location: [place];  Service: Orthopedics;  Laterality: Right;   VIDEO BRONCHOSCOPY WITH ENDOBRONCHIAL ULTRASOUND N/A 02/07/2017   Procedure: VIDEO BRONCHOSCOPY WITH ENDOBRONCHIAL ULTRASOUND;  Surgeon: Marshell Garfinkel, MD;  Location: [place];  Service: Pulmonary;  Laterality: N/A;    Assessment / Plan / Recommendation Clinical Impression  Patient is a 75 year old right-handed female well-known to rehab services from admission 02/13/2021 to 02/23/2021 for debility secondary to thigh wound hematoma status post I&D 02/06/2021 with wound VAC placement and drain as well as history of thyroid cancer, hypothyroidism, CVA,NHL/SVC syndrome/lung cancer radiation therapy as well as chemotherapy 2018 followed by Dr.Gudena, pacemaker placement for atrial fibrillation maintained on Eliquis followed by Dr. Caryl Comes, obesity status post lap band surgery hypertension hyperlipidemia and recurrent falls.   Presented 03/14/2021 with altered mental status found down after she reportedly slipped off the bed.  No loss of consciousness. Cranial CT scan showed acute intracranial hemorrhage extending from the region of the left caudothalamic groove into the left lateral ventricle and through the left foramen Monro into the third ventricle.  There was mild dilation of the left lateral ventricle approxi-5 mm left to right midline shift.  Findings indicate of of hypertensive in nature or secondary to rupture of a-comm aneurysm. Therapy evaluations completed due to patient decreased functional mobility altered mental status recommendations of inpatient rehab services and patient was admitted for a comprehensive rehab program 03/19/21.  SLP administered the [facility] Mental Status Examination (SLUMS). Patient scored  20/30 points with a score of 27 or above considered normal. Patient demonstrated deficits in  attention, error awareness and short-term recall. During an informal conversation, patient also appeared confused about details of admission with decreased orientation to situation with a flat affect throughout. Patient would benefit from skilled SLP intervention to maximize her cognitive functioning and overall functional independence prior to discharge.      Skilled Therapeutic Interventions          Administered a cognitive-linguistic evaluation, please see above for details.   SLP Assessment  Patient will need skilled [facility] during CIR admission    Recommendations  Oral Care Recommendations: Oral care BID Recommendations for Other Services: Neuropsych consult Patient destination: Home Follow up Recommendations: Home Health SLP;Outpatient SLP;24 hour supervision/assistance Equipment Recommended: None recommended by SLP    SLP Frequency 3 to 5 out of 7 days   SLP Duration  SLP Intensity  SLP Treatment/Interventions 2-2.5  weeks  Minumum of 1-2 x/day, 30 to 90 minutes  Cognitive remediation/compensation;Internal/external aids;Therapeutic Activities;Environmental controls;Cueing hierarchy;Functional tasks;Patient/family education    Pain Pain Assessment Faces Pain Scale: Hurts even more Pain Type: Acute pain Pain Location: Knee Pain Orientation: Left Pain Descriptors / Indicators: Aching Pain Onset: With Activity Pain Intervention(s): Ambulation/increased activity  Prior Functioning Type of Home: Apartment  Lives With: Alone Available Help at Discharge: Family;Available PRN/intermittently  SLP Evaluation Cognition Overall Cognitive Status: Impaired/Different from baseline Arousal/Alertness: Awake/alert Orientation Level: Oriented to person;Oriented to place;Oriented to time;Disoriented to situation Attention: Selective Sustained Attention: Appears intact Selective Attention: Impaired Selective Attention Impairment: Verbal basic;Functional  basic Memory: Impaired Memory Impairment: Retrieval deficit;Storage deficit;Decreased recall of new information;Decreased short term memory Awareness: Impaired Awareness Impairment: Intellectual impairment Problem Solving: Impaired Problem Solving Impairment: Verbal basic;Functional basic Safety/Judgment: Impaired  Comprehension Auditory Comprehension Overall Auditory Comprehension: Appears within functional limits for tasks assessed Expression Expression Primary Mode of Expression: Verbal Verbal Expression Overall Verbal Expression: Appears within functional limits for tasks assessed Written Expression Dominant Hand: Right Written Expression: Not tested Oral Motor Oral Motor/Sensory Function Overall Oral Motor/Sensory Function: Within functional limits Motor Speech Overall Motor Speech: Appears within functional limits for tasks assessed  Care Tool Care Tool Cognition Expression of Ideas and Wants Expression of Ideas and Wants: Some difficulty - exhibits some difficulty with expressing needs and ideas (e.g, some words or finishing thoughts) or speech is not clear   Understanding Verbal and Non-Verbal Content Understanding Verbal and Non-Verbal Content: Usually understands - understands most conversations, but misses some part/intent of message. Requires cues at times to understand   Memory/Recall Ability *first 3 days only Memory/Recall Ability *first 3 days only: That he or she is in a hospital/hospital unit;Current season     Short Term Goals: Week 1: SLP Short Term Goal 1 (Week 1): Patient will demonstrate functional problem solving for mildly complex tasks with Mod verbal cues. SLP Short Term Goal 2 (Week 1): Patient will recall new, daily information with Mod A multimodal cues for use of compnesatory strategies. SLP Short Term Goal 3 (Week 1): Patient will demosntrate selective attention to tasks in a mildly distracting enviornment for 30 minutes with Min verbal cues for  redirection. SLP Short Term Goal 4 (Week 1): Patient will self-monitor and correct errors during functional tasks with Mod A verbal cues.  Refer to Care Plan for Long Term Goals  Recommendations for other services: Neuropsych  Discharge Criteria: Patient will be discharged from SLP if patient refuses treatment 3 consecutive times without medical reason, if treatment goals not met, if there is a change in medical status, if patient makes no progress towards goals or if patient is discharged from hospital.  The above assessment, treatment plan, treatment alternatives and goals were discussed and mutually agreed upon: by patient  Bryceson Grape 03/20/2021, 3:46 PM

## 2021-03-20 NOTE — Congregational Nurse Program (Signed)
CN visited patient on 03/20/21 with coworker CN J.Church to offer support and encouragement.  Patient expressed concern about her placement after discharge from hospital. Patient was aware that her sister [name] was consulting with medical personnel to determine best placement given her cognition and physical limitations after her [place].

## 2021-03-20 NOTE — Telephone Encounter (Signed)
Benn Moulder Schaick telephoned CN on 03/20/27  @ 6 PM to share that decision had been made for patient to be transferred to CIR and the transfer had taken place the afternoon of 03/19/21.

## 2021-03-20 NOTE — Discharge Instructions (Signed)
Inpatient Rehab Discharge Instructions  Kayla Harrison Discharge date and time: No discharge date for patient encounter.   Activities/Precautions/ Functional Status: Activity: activity as tolerated Diet: regular diet Wound Care: Routine skin checks Functional status:  ___ No restrictions     ___ Walk up steps independently ___ 24/7 supervision/assistance   ___ Walk up steps with assistance ___ Intermittent supervision/assistance  ___ Bathe/dress independently ___ Walk with walker     _x__ Bathe/dress with assistance ___ Walk Independently    ___ Shower independently ___ Walk with assistance    ___ Shower with assistance ___ No alcohol     ___ Return to work/school ________  Special Instructions: No driving smoking or alcohol   My questions have been answered and I understand these instructions. I will adhere to these goals and the provided educational materials after my discharge from the hospital.  Patient/Caregiver Signature _______________________________ Date __________  Clinician Signature _______________________________________ Date __________  Please bring this form and your medication list with you to all your follow-up doctor's appointments.

## 2021-03-20 NOTE — Progress Notes (Signed)
Inpatient Rehabilitation Care Coordinator Assessment and Plan Patient Details  Name: Kayla Harrison MRN: [id number] Date of Birth: 05/30/1946  Today's Date: 03/20/2021  Hospital Problems: Principal Problem:   ICH (intracerebral hemorrhage) ([place])  Past Medical History:  Past Medical History:  Diagnosis Date   Anemia    Arthritis    osteoarthritis - knees and right shoulder   Blood transfusion without reported diagnosis    Breast cancer ([place])    Dr Margot Chimes, total thyroidectomy- 1999- for cancer   Brucellosis 1964   Chronic bilateral pleural effusions    Colon polyp    Dr Earlean Shawl   Complication of anesthesia    Ketamine produces LSD reaction, bright colored nightmarish experience    Dyslipidemia    Endometriosis    Fibroids    H/O pleural effusion    s/p thoracentesis w 3235m withdrawn   Hepatitis    [place] ?hepatitis    History of dysphagia    due to radiation therapy   History of hiatal hernia    small noted on PET scan   Hypertension    Hypothyroidism    Lung cancer, lower lobe ([place] 01/[place]   radiation RX completed 03/04/17; will start chemo 6/27, pt unaware of lung cancer   Morbid obesity ([place]    Status post lap band surgery   Nephrolithiasis    Non Hodgkin's lymphoma ([place]    on chemotherapy   Persistent atrial fibrillation ([place]    a. s/p PVI 2008 b. s/p convergent ablation 29924complicated by bradycardia requiring pacemaker implant   Personal history of radiation therapy    Presence of permanent cardiac pacemaker    Rotator cuff tear    Right   [place]   SVC syndrome    with lung mass and non hodgkins lymphoma   Thyroid cancer ([place] 2000   Past Surgical History:  Past Surgical History:  Procedure Laterality Date   ABDOMINAL HYSTERECTOMY  1983   afib ablation     a. 2008 PVI b. 2014 convergent ablation   APPENDECTOMY     BONE MARROW BIOPSY  02/21/2017   BREAST LUMPECTOMY Left 2010    [place]    2015- negative   CARDIOVERSION  10/09/2012   Procedure: CARDIOVERSION;  Surgeon: JMinus Breeding MD;  [place]  Service: Cardiovascular;  Laterality: N/A;   CARDIOVERSION  10/09/2012   Procedure: CARDIOVERSION;  Surgeon: JMinus Breeding MD;  [facility]  Service: Cardiovascular;  Laterality: N/A;  [name] the ok to add pt to the add on , but we must check to find out if the can add pt on at 1400 ([phone]   CARDIOVERSION N/A 11/20/2012   Procedure: CARDIOVERSION;  Surgeon: PFay Records MD;  [place]  Service: Cardiovascular;  Laterality: N/A;   CARDIOVERSION N/A 07/18/2017   Procedure: CARDIOVERSION;  Surgeon: NThayer Headings MD;  [facility]  Service: Cardiovascular;  Laterality: N/A;   CARDIOVERSION N/A 10/03/2017   Procedure: CARDIOVERSION;  Surgeon: CSanda Klein MD;  [facility];  Service: Cardiovascular;  Laterality: N/A;   CARDIOVERSION N/A 01/07/2018   Procedure: CARDIOVERSION;  Surgeon: Nahser, PWonda Cheng MD;  [facility]  Service: Cardiovascular;  Laterality: N/A;   CARDIOVERSION N/A 12/10/2019   Procedure: CARDIOVERSION;  Surgeon: CBuford Dresser MD;  [facility]  Service: Cardiovascular;  Laterality: N/A;   CARDIOVERSION N/A  03/09/2021   Procedure: CARDIOVERSION;  Surgeon: Pixie Casino, MD;  Location: [place];  Service: Cardiovascular;  Laterality: N/A;   CHOLECYSTECTOMY     COLONOSCOPY W/ POLYPECTOMY     Dr Earlean Shawl   CYSTOSCOPY N/A 02/06/2015   Procedure: CYSTOSCOPY;  Surgeon: Kathie Rhodes, MD;  [facility];  Service: Urology;  Laterality: N/A;   CYSTOSCOPY W/ RETROGRADES Left 11/17/2017   Procedure: CYSTOSCOPY WITH RETROGRADE /PYELOGRAM/;  Surgeon: Kathie Rhodes, MD;  [facility];  Service: Urology;  Laterality: Left;   CYSTOSCOPY WITH RETROGRADE PYELOGRAM, URETEROSCOPY AND STENT PLACEMENT Right 02/06/2015   Procedure: RETROGRADE  PYELOGRAM, RIGHT URETEROSCOPY STENT PLACEMENT;  Surgeon: Kathie Rhodes, MD;  [facility];  Service: Urology;  Laterality: Right;   CYSTOSCOPY WITH RETROGRADE PYELOGRAM, URETEROSCOPY AND STENT PLACEMENT Right 03/07/2017   Procedure: CYSTOSCOPY WITH RIGHT RETROGRADE PYELOGRAM,RIGHT  URETEROSCOPYLASER LITHOTRIPSY  AND STENT PLACEMENT AND STONE BASKETRY;  Surgeon: Kathie Rhodes, MD;  Location: [place];  Service: Urology;  Laterality: Right;   EYE SURGERY     cataract surgery   fatty mass removal  1999   pubic area   HOLMIUM LASER APPLICATION N/A 6/94/5038   Procedure: HOLMIUM LASER APPLICATION;  Surgeon: Kathie Rhodes, MD;  [facility];  Service: Urology;  Laterality: N/A;   HOLMIUM LASER APPLICATION Right 8/82/8003   Procedure: HOLMIUM LASER APPLICATION;  Surgeon: Kathie Rhodes, MD;  [facility];  Service: Urology;  Laterality: Right;   HOLMIUM LASER APPLICATION Left 4/91/7915   Procedure: HOLMIUM LASER APPLICATION;  Surgeon: Kathie Rhodes, MD;  [facility];  Service: Urology;  Laterality: Left;   I & D EXTREMITY Left 12/19/2020   Procedure: IRRIGATION AND DEBRIDEMENT OF LEFT HIP HEMATOMA WITH APPLICATION OF WOUND VAC;  Surgeon: Erle Crocker, MD;  Location: [place];  Service: Orthopedics;  Laterality: Left;   I & D EXTREMITY Left 02/06/2021   Procedure: IRRIGATION AND DEBRIDEMENT DEEP ABCESS LEFT THIGH, SECONDARY CLOSURE OF WOUND DEHISCENCE;  Surgeon: Erle Crocker, MD;  Location: [place];  Service: Orthopedics;  Laterality: Left;   IR FLUORO GUIDE PORT INSERTION RIGHT  02/24/2017   IR NEPHROSTOMY PLACEMENT RIGHT  11/17/2017   IR PATIENT EVAL TECH 0-60 MINS  03/11/2017   IR REMOVAL TUN ACCESS W/ PORT W/O FL MOD SED  04/20/2018   IR US GUIDE VASC ACCESS RIGHT  02/24/2017   KNEE ARTHROSCOPY     bilateral   LAPAROSCOPIC GASTRIC BANDING  07/10/2010   LAPAROSCOPIC GASTRIC BANDING     Laparoscopic adjustable banding APS System with posterior  hiatal hernia, 2 suture.   LAPAROTOMY     for ruptured ovary and ovarian artery    NEPHROLITHOTOMY Right 11/17/2017   Procedure: NEPHROLITHOTOMY PERCUTANEOUS;  Surgeon: Kathie Rhodes, MD;  [facility];  Service: Urology;  Laterality: Right;   PACEMAKER INSERTION  03/10/2013   MDT dual chamber PPM   POCKET REVISION N/A 12/08/2013   Procedure: POCKET REVISION;  Surgeon: Deboraha Sprang, MD;  Location: [facility] CATH LAB;  Service: Cardiovascular;  Laterality: N/A;   PORTA CATH INSERTION     REVERSE SHOULDER ARTHROPLASTY Right 05/14/2018   Procedure: RIGHT REVERSE SHOULDER ARTHROPLASTY;  Surgeon: Tania Ade, MD;  Location: [place];  Service: Orthopedics;  Laterality: Right;   REVERSE SHOULDER REPLACEMENT Right 05/14/2018   RIGHT HEART CATH N/A 07/21/2019   Procedure: RIGHT HEART CATH;  Surgeon: Larey Dresser, MD;  Location: [place] CV LAB;  Service: Cardiovascular;  Laterality:  N/A;   TEE WITH CARDIOVERSION  09/22/2017   TEE WITHOUT CARDIOVERSION N/A 10/03/2017   Procedure: TRANSESOPHAGEAL ECHOCARDIOGRAM (TEE);  Surgeon: Sanda Klein, MD;  Location: [facility] ENDOSCOPY;  Service: Cardiovascular;  Laterality: N/A;   TEE WITHOUT CARDIOVERSION N/A 08/04/2019   Procedure: TRANSESOPHAGEAL ECHOCARDIOGRAM (TEE);  Surgeon: Larey Dresser, MD;  [facility];  Service: Cardiovascular;  Laterality: N/A;   TEE WITHOUT CARDIOVERSION N/A 12/10/2019   Procedure: TRANSESOPHAGEAL ECHOCARDIOGRAM (TEE);  Surgeon: Buford Dresser, MD;  Location: [facility] ENDOSCOPY;  Service: Cardiovascular;  Laterality: N/A;   TEE WITHOUT CARDIOVERSION N/A 03/09/2021   Procedure: TRANSESOPHAGEAL ECHOCARDIOGRAM (TEE);  Surgeon: Pixie Casino, MD;  Location: [place];  Service: Cardiovascular;  Laterality: N/A;   THYROIDECTOMY  1998   Dr Margot Chimes   TONSILLECTOMY     TOTAL KNEE ARTHROPLASTY  04/13/2012   Procedure: TOTAL KNEE ARTHROPLASTY;  Surgeon: Rudean Haskell, MD;  Location: [place];  Service: Orthopedics;   Laterality: Right;   VIDEO BRONCHOSCOPY WITH ENDOBRONCHIAL ULTRASOUND N/A 02/07/2017   Procedure: VIDEO BRONCHOSCOPY WITH ENDOBRONCHIAL ULTRASOUND;  Surgeon: Marshell Garfinkel, MD;  Location: [place];  Service: Pulmonary;  Laterality: N/A;   Social History:  reports that she has never smoked. She has never used smokeless tobacco. She reports that she does not drink alcohol and does not use drugs.  Family / Support Systems Marital Status: Single Patient Roles: Other (Comment) (sibling) Other Supports: [name]/POA [phone]  Karen-sister-local [phone]-cell Anticipated Caregiver: Unsure who if any-has intermittent checks from karen and friends/congrational RN Ability/Limitations of Caregiver: No caregiver does not have 24/7 care Caregiver Availability: Intermittent Family Dynamics: Local sister-Karen who will check on her, friends check on her, but since last admission needed supervision which she did not have and resulted in her re-admission here to the hospital  Social History Preferred language: English Religion: Non-Denominational Cultural Background: No issues Education: Secretary/administrator educated Read: Yes Write: Yes Employment Status: Retired Public relations account executive Issues: None Guardian/Conservator: Scientific laboratory technician in [place] MD feels pt is not fully capable of making her own decisions while here. So will look toward her POA-Mary for any decisions   Abuse/Neglect Abuse/Neglect Assessment Can Be Completed: Yes Physical Abuse: Denies Verbal Abuse: Denies Sexual Abuse: Denies Exploitation of patient/patient's resources: Denies Self-Neglect: Denies  Emotional Status Pt's affect, behavior and adjustment status: Pt is feeloing she is makking progress daily but still has memory issues. Discussed with her deficits and needs team will recommend 24/7 care whether facilty or hired assist. Pt continues to be against going to a facility. Pt has multiple medical issues and has been in this  hospital multiple times Recent Psychosocial Issues: Numerosu hospital admissions in the past 6 months. Being realistic regarding her care needs. Psychiatric History: No history Substance Abuse History: NA  Patient / Family Perceptions, Expectations & Goals Pt/Family understanding of illness & functional limitations: Pt and sister-karen can explain reason she is in the hospital and that she had a stroke. She has had other family members who have had strokes and trhey did not end well. Premorbid pt/family roles/activities: Sister, friend, neighbor, etc Anticipated changes in roles/activities/participation: resume Pt/family expectations/goals: Pt states: " I was doing well at home before this."  Santiago Glad states: " I have had health issues and could not check on her as much as I thought I could, but I dont stay with her."  [employer]: Other (Comment) (Congreational RN) Premorbid Home Care/DME Agencies: Other (Comment) ([facility] has rollator) Transportation available at discharge: others pt has not  driven for months Resource referrals recommended: Neuropsychology  Discharge Planning Living Arrangements: Alone Support Systems: Other relatives, Friends/neighbors, Church/faith community Type of Residence: Private residence Insurance Resources: [employer], Multimedia programmer (specify) ([employer]) Museum/gallery curator Resources: [place] Referred: No Living Expenses: Rent Money Management: Patient Does the patient have any problems obtaining your medications?: No Home Management: Self Patient/Family Preliminary Plans: Pt plans to return to her home with intermittent assist that she had prior to admission. Made aware team will feel she will need 24/7 care as before she refuses this and decided to go home. This time MD feels she is not capable of making her own decisions so will go to [facility] her POA. May need neuro-psych involved while here. Care  Coordinator Barriers to Discharge: Lack of/limited family support, Decreased caregiver support Care Coordinator Anticipated Follow Up Needs: SNF, HH/OP  Clinical Impression Pleasantly confused female who has been to rehab multiple times in the past two months and is aware of the process. She continues to insist on going home even if she requires care. Her sister-karen is local and can check on her and her other [name] is her POA so will look to her to make any decision while here. Pt will resist and question her ability to make her own decisions, very independent and strong willed. Will await therapy evaluations.  Elease Hashimoto 03/20/2021, 10:41 AM

## 2021-03-20 NOTE — Plan of Care (Signed)
  Problem: RH Balance Goal: LTG Patient will maintain dynamic sitting balance (PT) Description: LTG:  Patient will maintain dynamic sitting balance with assistance during mobility activities (PT) Flowsheets (Taken 03/20/2021 1828) LTG: Pt will maintain dynamic sitting balance during mobility activities with:: Independent Goal: LTG Patient will maintain dynamic standing balance (PT) Description: LTG:  Patient will maintain dynamic standing balance with assistance during mobility activities (PT) Flowsheets (Taken 03/20/2021 1828) LTG: Pt will maintain dynamic standing balance during mobility activities with:: Supervision/Verbal cueing   Problem: Sit to Stand Goal: LTG:  Patient will perform sit to stand with assistance level (PT) Description: LTG:  Patient will perform sit to stand with assistance level (PT) Flowsheets (Taken 03/20/2021 1828) LTG: PT will perform sit to stand in preparation for functional mobility with assistance level: Supervision/Verbal cueing   Problem: RH Bed Mobility Goal: LTG Patient will perform bed mobility with assist (PT) Description: LTG: Patient will perform bed mobility with assistance, with/without cues (PT). Flowsheets (Taken 03/20/2021 1828) LTG: Pt will perform bed mobility with assistance level of: Independent with assistive device    Problem: RH Bed to Chair Transfers Goal: LTG Patient will perform bed/chair transfers w/assist (PT) Description: LTG: Patient will perform bed to chair transfers with assistance (PT). Flowsheets (Taken 03/20/2021 1828) LTG: Pt will perform Bed to Chair Transfers with assistance level: Supervision/Verbal cueing   Problem: RH Car Transfers Goal: LTG Patient will perform car transfers with assist (PT) Description: LTG: Patient will perform car transfers with assistance (PT). Flowsheets (Taken 03/20/2021 1828) LTG: Pt will perform car transfers with assist:: Contact Guard/Touching assist   Problem: RH Ambulation Goal: LTG  Patient will ambulate in controlled environment (PT) Description: LTG: Patient will ambulate in a controlled environment, # of feet with assistance (PT). Flowsheets (Taken 03/20/2021 1828) LTG: Pt will ambulate in controlled environ  assist needed:: Supervision/Verbal cueing LTG: Ambulation distance in controlled environment: 150' w/ LRAD Goal: LTG Patient will ambulate in home environment (PT) Description: LTG: Patient will ambulate in home environment, # of feet with assistance (PT). Flowsheets (Taken 03/20/2021 1828) LTG: Pt will ambulate in home environ  assist needed:: Supervision/Verbal cueing LTG: Ambulation distance in home environment: 60' w/ LRAD   Problem: RH Wheelchair Mobility Goal: LTG Patient will propel w/c in controlled environment (PT) Description: LTG: Patient will propel wheelchair in controlled environment, # of feet with assist (PT) Flowsheets (Taken 03/20/2021 1828) LTG: Pt will propel w/c in controlled environ  assist needed:: Supervision/Verbal cueing LTG: Propel w/c distance in controlled environment: 150'  Magda Kiel, PT

## 2021-03-20 NOTE — Plan of Care (Signed)
  Problem: Sit to Stand Goal: LTG:  Patient will perform sit to stand in prep for activites of daily living with assistance level (OT) Description: LTG:  Patient will perform sit to stand in prep for activites of daily living with assistance level (OT) Flowsheets (Taken 03/20/2021 1744) LTG: PT will perform sit to stand in prep for activites of daily living with assistance level: Independent with assistive device   Problem: RH Bathing Goal: LTG Patient will bathe all body parts with assist levels (OT) Description: LTG: Patient will bathe all body parts with assist levels (OT) Flowsheets (Taken 03/20/2021 1744) LTG: Pt will perform bathing with assistance level/cueing: Independent with assistive device    Problem: RH Dressing Goal: LTG Patient will perform upper body dressing (OT) Description: LTG Patient will perform upper body dressing with assist, with/without cues (OT). Flowsheets (Taken 03/20/2021 1744) LTG: Pt will perform upper body dressing with assistance level of: Independent with assistive device Goal: LTG Patient will perform lower body dressing w/assist (OT) Description: LTG: Patient will perform lower body dressing with assist, with/without cues in positioning using equipment (OT) Flowsheets (Taken 03/20/2021 1744) LTG: Pt will perform lower body dressing with assistance level of: Independent with assistive device   Problem: RH Toileting Goal: LTG Patient will perform toileting task (3/3 steps) with assistance level (OT) Description: LTG: Patient will perform toileting task (3/3 steps) with assistance level (OT)  Flowsheets (Taken 03/20/2021 1744) LTG: Pt will perform toileting task (3/3 steps) with assistance level: Independent with assistive device   Problem: RH Simple Meal Prep Goal: LTG Patient will perform simple meal prep w/assist (OT) Description: LTG: Patient will perform simple meal prep with assistance, with/without cues (OT). Flowsheets (Taken 03/20/2021 1744) LTG:  Pt will perform simple meal prep with assistance level of: Independent with assistive device   Problem: RH Light Housekeeping Goal: LTG Patient will perform light housekeeping w/assist (OT) Description: LTG: Patient will perform light housekeeping with assistance, with/without cues (OT). Flowsheets (Taken 03/20/2021 1744) LTG: Pt will perform light housekeeping with assistance level of: Independent with assistive device   Problem: RH Toilet Transfers Goal: LTG Patient will perform toilet transfers w/assist (OT) Description: LTG: Patient will perform toilet transfers with assist, with/without cues using equipment (OT) Flowsheets (Taken 03/20/2021 1744) LTG: Pt will perform toilet transfers with assistance level of: Independent with assistive device   Problem: RH Tub/Shower Transfers Goal: LTG Patient will perform tub/shower transfers w/assist (OT) Description: LTG: Patient will perform tub/shower transfers with assist, with/without cues using equipment (OT) Flowsheets (Taken 03/20/2021 1744) LTG: Pt will perform tub/shower stall transfers with assistance level of: Independent with assistive device LTG: Pt will perform tub/shower transfers from: Walk in shower   Problem: RH Awareness Goal: LTG: Patient will demonstrate awareness during functional activites type of (OT) Description: LTG: Patient will demonstrate awareness during functional activites type of (OT) Flowsheets (Taken 03/20/2021 1744) Patient will demonstrate awareness during functional activites type of: Emergent LTG: Patient will demonstrate awareness during functional activites type of (OT): Supervision   Problem: RH Attention Goal: LTG Patient will demonstrate this level of attention during functional activites (OT) Description: LTG:  Patient will demonstrate this level of attention during functional activites  (OT) Flowsheets (Taken 03/20/2021 1744) Patient will demonstrate this level of attention during functional activites:  Selective LTG: Patient will demonstrate this level of attention during functional activites (OT): Supervision

## 2021-03-20 NOTE — Progress Notes (Signed)
CSW received phone call from patient's sister, Kayla Harrison, requesting CSW email her a list of SNFs and ALF facilities. CSW emailed list securely to [email or web address]  Gilmore Laroche, MSW, [facility]

## 2021-03-20 NOTE — Progress Notes (Signed)
[facility]patient [place] Individual Statement of Services  Patient Name:  Kayla Harrison  Date:  03/20/2021  Welcome to the [facility].  Our goal is to provide you with an individualized program based on your diagnosis and situation, designed to meet your specific needs.  With this comprehensive rehabilitation program, you will be expected to participate in at least 3 hours of rehabilitation therapies Monday-Friday, with modified therapy programming on the weekends.  Your rehabilitation program will include the following services:  Physical Therapy (PT), Occupational Therapy (OT), Speech Therapy (ST), 24 hour per day rehabilitation nursing, Therapeutic Recreaction (TR), Neuropsychology, Care Coordinator, Rehabilitation Medicine, Nutrition Services, and Pharmacy Services  Weekly team conferences will be held on Tuesday to discuss your progress.  Your Inpatient Rehabilitation Care Coordinator will talk with you frequently to get your input and to update you on team discussions.  Team conferences with you and your family in attendance may also be held.  Expected length of stay: 2-3 weeks  Overall anticipated outcome: supervision level  Depending on your progress and recovery, your program may change. Your Inpatient Rehabilitation Care Coordinator will coordinate services and will keep you informed of any changes. Your Inpatient Rehabilitation Care Coordinator's name and contact numbers are listed  below.  The following services may also be recommended but are not provided by the [place] will be made to provide these services after discharge if needed.  Arrangements include referral to agencies that provide these services.  Your insurance has been verified to be:  [place]:  [name]  Pertinent information will be shared with your doctor and your insurance company.  Inpatient Rehabilitation Care Coordinator:  Ovidio Kin, [place] or Emilia Beck  Information discussed with and copy given to patient by: Elease Hashimoto, 03/20/2021, 10:46 AM

## 2021-03-20 NOTE — Progress Notes (Signed)
Physical Therapy Session Note  Patient Details  Name: Kayla Harrison MRN: [id number] Date of Birth: 1945/11/02  Today's Date: 03/20/2021 PT Individual Time:  1503-1600  PT Individual Time Calculation (min): 57 min    Short Term Goals: Week 1:  PT Short Term Goal 1 (Week 1): Patient will perform bed mobility with S PT Short Term Goal 2 (Week 1): Patient will transfer sit to stand with CGA. PT Short Term Goal 3 (Week 1): Patient will ambulate x 100' with min A. PT Short Term Goal 4 (Week 1): Patient negotiate at least 1 step with min A.  Skilled Therapeutic Interventions/Progress Updates:  Contact guard precautions donned and Patient seated in recliner on entrance to room. Friend present. Patient alert and agreeable to PT session. Patient denied pain during session.  Therapeutic Activity: SLP alerted this therapist as to pt's TED hose and inappropriate fit. Knee high TEDs rolling down pt's calves and squeezing with light pain/ discomfort. TEDs doffed and pt's BLE measured for appropriate sizing. Size unavailable on floor and order placed with pt's LPN to be placed with nursing secretary.  Bed Mobility: Patient performed sit--> supine with Mod/ Max A. VC/ tc required for technique and effort to initiate bringing LE to bed surface. Transfers: Patient performed STS, SPVT, and toilet transfers  with CGA using safety rails and bed rails for UE support. Short distance ambulatory transfer with Min A. Provided vc for technique. Max A for clothing mgmt and supervision for pericare.   Patient supine  in bed at end of session with brakes locked, bed alarm set, and all needs within reach.     Therapy Documentation Precautions:  Precautions Precautions: Fall Precaution Comments: L hip wound vac, watch for orthostatic hypotension Restrictions Weight Bearing Restrictions: No   Therapy/Group: Individual Therapy  Alger Simons PT, DPT 03/20/2021, 4:53 PM

## 2021-03-20 NOTE — Evaluation (Signed)
Occupational Therapy Assessment and Plan  Patient Details  Name: Kayla Harrison MRN: [id number] Date of Birth: June 12, 1946  OT Diagnosis: altered mental status, cognitive deficits, muscle weakness (generalized), other lymphedema, and swelling of limb Rehab Potential: Rehab Potential (ACUTE ONLY): Good ELOS: 14-16 days   Today's Date: 03/20/2021 OT Individual Time: 0800-0910 OT Individual Time Calculation (min): 70 min     Hospital Problem: Principal Problem:   ICH (intracerebral hemorrhage) ([place])   Past Medical History:  Past Medical History:  Diagnosis Date   Anemia    Arthritis    osteoarthritis - knees and right shoulder   Blood transfusion without reported diagnosis    Breast cancer ([place])    Dr Margot Chimes, total thyroidectomy- 1999- for cancer   Brucellosis 1964   Chronic bilateral pleural effusions    Colon polyp    Dr Earlean Shawl   Complication of anesthesia    Ketamine produces LSD reaction, bright colored nightmarish experience    Dyslipidemia    Endometriosis    Fibroids    H/O pleural effusion    s/p thoracentesis w 3263m withdrawn   Hepatitis    [place] ?hepatitis    History of dysphagia    due to radiation therapy   History of hiatal hernia    small noted on PET scan   Hypertension    Hypothyroidism    [place]   radiation RX completed 03/04/17; will start chemo 6/27, pt unaware of lung cancer   Morbid [place] ([place]    Status post lap band surgery   Nephrolithiasis    Non Hodgkin's lymphoma ([place]    on chemotherapy   Persistent atrial fibrillation ([place]    a. s/p PVI 2008 b. s/p convergent ablation 24128complicated by bradycardia requiring pacemaker implant   Personal history of radiation therapy    Presence of permanent cardiac pacemaker    Rotator cuff tear    Right   [place]   SVC syndrome    with lung mass and non hodgkins lymphoma   Thyroid cancer ([place]  2000   Past Surgical History:  Past Surgical History:  Procedure Laterality Date   ABDOMINAL HYSTERECTOMY  1983   afib ablation     a. 2008 PVI b. 2014 convergent ablation   APPENDECTOMY     BONE MARROW BIOPSY  02/21/2017   BREAST LUMPECTOMY Left 2010   [place]    2015- negative   CARDIOVERSION  10/09/2012   Procedure: CARDIOVERSION;  Surgeon: JMinus Breeding MD;  [place]  Service: Cardiovascular;  Laterality: N/A;   CARDIOVERSION  10/09/2012   Procedure: CARDIOVERSION;  Surgeon: JMinus Breeding MD;  [facility]  Service: Cardiovascular;  Laterality: N/A;  [name] the ok to add pt to the add on , but we must check to find out if the can add pt on at [phone] ( [phone])   CARDIOVERSION N/A 11/20/2012   Procedure: CARDIOVERSION;  Surgeon: PFay Records MD;  [facility]  Service: Cardiovascular;  Laterality: N/A;   CARDIOVERSION N/A 07/18/2017   Procedure: CARDIOVERSION;  Surgeon: NThayer Headings MD;  [facility]  Service: Cardiovascular;  Laterality: N/A;   CARDIOVERSION N/A 10/03/2017   Procedure: CARDIOVERSION;  Surgeon: CSanda Klein MD;  [facility];  Service: Cardiovascular;  Laterality: N/A;   CARDIOVERSION N/A 01/07/2018   Procedure: CARDIOVERSION;  Surgeon:  Nahser, Wonda Cheng, MD;  [facility];  Service: Cardiovascular;  Laterality: N/A;   CARDIOVERSION N/A 12/10/2019   Procedure: CARDIOVERSION;  Surgeon: Buford Dresser, MD;  [facility];  Service: Cardiovascular;  Laterality: N/A;   CARDIOVERSION N/A 03/09/2021   Procedure: CARDIOVERSION;  Surgeon: Pixie Casino, MD;  Location: [place];  Service: Cardiovascular;  Laterality: N/A;   CHOLECYSTECTOMY     COLONOSCOPY W/ POLYPECTOMY     Dr Earlean Shawl   CYSTOSCOPY N/A 02/06/2015   Procedure: CYSTOSCOPY;  Surgeon: Kathie Rhodes, MD;  [facility];  Service: Urology;  Laterality: N/A;   CYSTOSCOPY W/ RETROGRADES Left  11/17/2017   Procedure: CYSTOSCOPY WITH RETROGRADE /PYELOGRAM/;  Surgeon: Kathie Rhodes, MD;  [facility];  Service: Urology;  Laterality: Left;   CYSTOSCOPY WITH RETROGRADE PYELOGRAM, URETEROSCOPY AND STENT PLACEMENT Right 02/06/2015   Procedure: RETROGRADE PYELOGRAM, RIGHT URETEROSCOPY STENT PLACEMENT;  Surgeon: Kathie Rhodes, MD;  [facility];  Service: Urology;  Laterality: Right;   CYSTOSCOPY WITH RETROGRADE PYELOGRAM, URETEROSCOPY AND STENT PLACEMENT Right 03/07/2017   Procedure: CYSTOSCOPY WITH RIGHT RETROGRADE PYELOGRAM,RIGHT  URETEROSCOPYLASER LITHOTRIPSY  AND STENT PLACEMENT AND STONE BASKETRY;  Surgeon: Kathie Rhodes, MD;  Location: [place];  Service: Urology;  Laterality: Right;   EYE SURGERY     cataract surgery   fatty mass removal  1999   pubic area   HOLMIUM LASER APPLICATION N/A 3/33/5456   Procedure: HOLMIUM LASER APPLICATION;  Surgeon: Kathie Rhodes, MD;  [facility];  Service: Urology;  Laterality: N/A;   HOLMIUM LASER APPLICATION Right 2/56/3893   Procedure: HOLMIUM LASER APPLICATION;  Surgeon: Kathie Rhodes, MD;  Location: [facility];  Service: Urology;  Laterality: Right;   HOLMIUM LASER APPLICATION Left 7/34/2876   Procedure: HOLMIUM LASER APPLICATION;  Surgeon: Kathie Rhodes, MD;  [facility];  Service: Urology;  Laterality: Left;   I & D EXTREMITY Left 12/19/2020   Procedure: IRRIGATION AND DEBRIDEMENT OF LEFT HIP HEMATOMA WITH APPLICATION OF WOUND VAC;  Surgeon: Erle Crocker, MD;  Location: [place];  Service: Orthopedics;  Laterality: Left;   I & D EXTREMITY Left 02/06/2021   Procedure: IRRIGATION AND DEBRIDEMENT DEEP ABCESS LEFT THIGH, SECONDARY CLOSURE OF WOUND DEHISCENCE;  Surgeon: Erle Crocker, MD;  Location: [place];  Service: Orthopedics;  Laterality: Left;   IR FLUORO GUIDE PORT INSERTION RIGHT  02/24/2017   IR NEPHROSTOMY PLACEMENT RIGHT  11/17/2017   IR PATIENT EVAL TECH 0-60 MINS  03/11/2017   IR  REMOVAL TUN ACCESS W/ PORT W/O FL MOD SED  04/20/2018   IR US GUIDE VASC ACCESS RIGHT  02/24/2017   KNEE ARTHROSCOPY     bilateral   LAPAROSCOPIC GASTRIC BANDING  07/10/2010   LAPAROSCOPIC GASTRIC BANDING     Laparoscopic adjustable banding APS System with posterior hiatal hernia, 2 suture.   LAPAROTOMY     for ruptured ovary and ovarian artery    NEPHROLITHOTOMY Right 11/17/2017   Procedure: NEPHROLITHOTOMY PERCUTANEOUS;  Surgeon: Kathie Rhodes, MD;  [facility];  Service: Urology;  Laterality: Right;   PACEMAKER INSERTION  03/10/2013   MDT dual chamber PPM   POCKET REVISION N/A 12/08/2013   Procedure: POCKET REVISION;  Surgeon: Deboraha Sprang, MD;  [facility] C[facility] LAB;  Service: Cardiovascular;  Laterality: N/A;   PORTA CATH INSERTION     REVERSE SHOULDER ARTHROPLASTY Right 05/14/2018   Procedure: RIGHT REVERSE SHOULDER ARTHROPLASTY;  Surgeon: Tania Ade, MD;  Location: [place];  Service: Orthopedics;  Laterality: Right;   REVERSE SHOULDER REPLACEMENT Right 05/14/2018   RIGHT HEART CATH N/A 07/21/2019   Procedure: RIGHT HEART CATH;  Surgeon: Larey Dresser, MD;  Location: [place] CV LAB;  Service: Cardiovascular;  Laterality: N/A;   TEE WITH CARDIOVERSION  09/22/2017   TEE WITHOUT CARDIOVERSION N/A 10/03/2017   Procedure: TRANSESOPHAGEAL ECHOCARDIOGRAM (TEE);  Surgeon: Sanda Klein, MD;  Location: [facility] ENDOSCOPY;  Service: Cardiovascular;  Laterality: N/A;   TEE WITHOUT CARDIOVERSION N/A 08/04/2019   Procedure: TRANSESOPHAGEAL ECHOCARDIOGRAM (TEE);  Surgeon: Larey Dresser, MD;  [facility];  Service: Cardiovascular;  Laterality: N/A;   TEE WITHOUT CARDIOVERSION N/A 12/10/2019   Procedure: TRANSESOPHAGEAL ECHOCARDIOGRAM (TEE);  Surgeon: Buford Dresser, MD;  Location: [facility] ENDOSCOPY;  Service: Cardiovascular;  Laterality: N/A;   TEE WITHOUT CARDIOVERSION N/A 03/09/2021   Procedure: TRANSESOPHAGEAL ECHOCARDIOGRAM (TEE);  Surgeon: Pixie Casino, MD;   Location: [place];  Service: Cardiovascular;  Laterality: N/A;   THYROIDECTOMY  1998   Dr Margot Chimes   TONSILLECTOMY     TOTAL KNEE ARTHROPLASTY  04/13/2012   Procedure: TOTAL KNEE ARTHROPLASTY;  Surgeon: Rudean Haskell, MD;  Location: [place];  Service: Orthopedics;  Laterality: Right;   VIDEO BRONCHOSCOPY WITH ENDOBRONCHIAL ULTRASOUND N/A 02/07/2017   Procedure: VIDEO BRONCHOSCOPY WITH ENDOBRONCHIAL ULTRASOUND;  Surgeon: Marshell Garfinkel, MD;  Location: [place];  Service: Pulmonary;  Laterality: N/A;    Assessment & Plan Clinical Impression: Pt is a 75 y.o. female who presented 6/22 with AMS after being found on ground at home. Pt with nontraumatic ICH. CT head shows an acute intracranial hemorrhage that extends into the left lateral ventricle and left foramen of Monro into the third ventricle.  She has very mild dilation of her left lateral ventricle with about 5 mm of left-to-right shift. PMH: admission 02/13/2021 to 02/23/2021 for debility secondary to thigh wound hematoma status post I&D 02/06/2021 with wound VAC placement, anemia, arthritis, breast cancer, brucellosis, chronic bil pleural effusions, endometriosis, hepatitis, HTN, hx of dysphagia, hypothyroidism, lung cancer, nephrolithiasis, non Hodgkin's lymphoma, a-fib, cardiac pacemaker, rotator cuff tear, CVA, SVC syndrome, and thyroid cancer.  Patient transferred to CIR on 03/19/2021 .    Patient currently requires mod with basic self-care skills secondary to muscle weakness, decreased cardiorespiratoy endurance, decreased coordination, decreased attention, decreased awareness, decreased problem solving, decreased safety awareness, decreased memory, and delayed processing, and decreased sitting balance, decreased standing balance, decreased postural control, and decreased balance strategies.  Prior to hospitalization, patient could complete BADLs at modified independent level and intermittent supervision.  Patient will benefit from skilled  intervention to decrease level of assist with basic self-care skills, increase independence with basic self-care skills, and increase level of independence with iADL prior to discharge home independently.  Anticipate patient will require intermittent supervision and follow up home health.  OT - End of Session Activity Tolerance: Tolerates 10 - 20 min activity with multiple rests Endurance Deficit: Yes Endurance Deficit Description: required frequent rest breaks OT Assessment Rehab Potential (ACUTE ONLY): Good OT Barriers to Discharge: Decreased caregiver support;Weight;Lack of/limited family support;Wound Care OT Patient demonstrates impairments in the following area(s): Balance;Cognition;Edema;Endurance;Motor;Pain;Sensory;Safety OT Basic ADL's Functional Problem(s): Bathing;Dressing;Toileting OT Advanced ADL's Functional Problem(s): Simple Meal Preparation;Light Housekeeping;Laundry OT Transfers Functional Problem(s): Toilet;Tub/Shower OT Plan OT Intensity: Minimum of 1-2 x/day, 45 to 90 minutes OT Frequency: 5 out of 7 days OT Duration/Estimated Length of Stay: 14-16 days OT Treatment/Interventions: Balance/vestibular training;Discharge planning;DME/adaptive equipment instruction;Therapeutic Exercise;UE/LE Strength taining/ROM;Therapeutic Activities;Skin care/wound managment;Self Care/advanced ADL retraining;Pain  management;Patient/family education;Psychosocial support;Functional mobility training;UE/LE Coordination activities;Community reintegration;Cognitive remediation/compensation OT Self Feeding Anticipated Outcome(s): NA OT Basic Self-Care Anticipated Outcome(s): mod I OT Toileting Anticipated Outcome(s): mod I OT Bathroom Transfers Anticipated Outcome(s): mod I OT Recommendation Recommendations for Other Services: Therapeutic Recreation consult;Neuropsych consult Therapeutic Recreation Interventions: Clinical cytogeneticist;Outing/community reintergration Patient  destination: Home Follow Up Recommendations: Home health OT Equipment Recommended: To be determined   OT Evaluation Precautions/Restrictions  Precautions Precautions: Fall Precaution Comments: L hip wound vac, watch for orthostatic hypotension, contact precautions Restrictions Weight Bearing Restrictions: No General Chart Reviewed: Yes Response to Previous Treatment: Patient reporting fatigue but able to participate Family/Caregiver Present: No Vital Signs  Pain Pain Assessment Pain Scale: 0-10 Pain Score:  (unrated) Faces Pain Scale: Hurts even more Pain Type: Acute pain Pain Location: Leg Pain Orientation: Right Pain Descriptors / Indicators: Aching;Sharp Pain Onset: With Activity Pain Intervention(s): Repositioned;Distraction;Rest Multiple Pain Sites: No Home Living/Prior Functioning Home Living Family/patient expects to be discharged to:: Private residence Living Arrangements: Alone Available Help at Discharge: Family, Available PRN/intermittently Type of Home: Apartment Home Access: Level entry Home Layout: One level Bathroom Shower/Tub: Multimedia programmer: Standard Bathroom Accessibility: Yes Additional Comments: Pt has cat named "Callie".  Lives With: Alone IADL History Homemaking Responsibilities: Yes Meal Prep Responsibility:  (Friends have recently been bringing food) Medical sales representative Responsibility: Primary Cleaning Responsibility: No (Cleaners 2x week) [employer] Paying/Finance Responsibility: Primary Shopping Responsibility: Primary Child Care Responsibility: No Current License: Yes Mode of Transportation: Car (Has not driven recently) Leisure and Hobbies: Reading Prior Function Level of Independence: Requires assistive device for independence  Able to Take Stairs?: No Driving: No (not recently) Vocation: Retired Comments: mod I with rollator.  States she was able to care for meds, meals and basic home management.  Has not driven in a few  months. Vision Baseline Vision/History: Wears glasses Wears Glasses: Reading only Patient Visual Report: No change from baseline Vision Assessment?: No apparent visual deficits Perception  Perception: Within Functional Limits Praxis Praxis: Intact Cognition Overall Cognitive Status: Impaired/Different from baseline Arousal/Alertness: Awake/alert Orientation Level: Person;Place;Situation Person: Oriented Place: Oriented Situation: Oriented Year: 2022 Month: June Day of Week: Incorrect Memory: Impaired Memory Impairment: Retrieval deficit;Storage deficit;Decreased recall of new information;Decreased short term memory Decreased Short Term Memory: Verbal basic;Functional basic Immediate Memory Recall: Sock;Blue;Bed Memory Recall Sock: Not able to recall Memory Recall Blue: With Cue Memory Recall Bed: With Cue Attention: Selective Sustained Attention: Appears intact Selective Attention: Impaired Selective Attention Impairment: Verbal basic;Functional basic Awareness: Impaired Awareness Impairment: Intellectual impairment Problem Solving: Impaired Problem Solving Impairment: Verbal basic;Functional basic Safety/Judgment: Impaired Comments: Flat affect throughout session Sensation Sensation Light Touch: Impaired by gross assessment Hot/Cold: Appears Intact Proprioception: Appears Intact Stereognosis: Appears Intact Additional Comments: Pt reports altered sensation along L/R LEs Coordination Gross Motor Movements are Fluid and Coordinated: No Fine Motor Movements are Fluid and Coordinated: No Coordination and Movement Description: generalized weakness and deconditioning; [place] slight deficits RUE>LUE, further tested in functional contexts Finger Nose Finger Test: WFL bilaterally, slight issues following commands likely more cognitive Motor  Motor Motor: Within Functional Limits Motor - Skilled Clinical Observations: generalized weakness and deconditioning  Trunk/Postural  Assessment  Cervical Assessment Cervical Assessment: Exceptions to [facility] (forward head) Thoracic Assessment Thoracic Assessment: Exceptions to [facility] (rounded shoulders) Lumbar Assessment Lumbar Assessment: Exceptions to [facility] (posterior pelvic tilt) Postural Control Postural Control: Deficits on evaluation Trunk Control: decreased trunk control in sitting balance unsupported and during standing with posterior leaning  Balance Balance Balance Assessed: Yes Static Sitting  Balance Static Sitting - Balance Support: Feet supported;No upper extremity supported Static Sitting - Level of Assistance: 5: Stand by assistance Dynamic Sitting Balance Dynamic Sitting - Balance Support: Feet supported;Right upper extremity supported;Left upper extremity supported Dynamic Sitting - Level of Assistance: 5: Stand by assistance Dynamic Sitting - Balance Activities: Reaching for objects;Forward lean/weight shifting Sitting balance - Comments: Static sitting EOB with supervision, reaching for dressing items sitting on BSC, reaching to wash BLEs. Static Standing Balance Static Standing - Balance Support: Bilateral upper extremity supported Static Standing - Level of Assistance: 3: Mod assist Static Standing - Comment/# of Minutes: standing EOB preparing for stand pivot transfer Dynamic Standing Balance Dynamic Standing - Balance Support: Bilateral upper extremity supported;During functional activity Dynamic Standing - Level of Assistance: 3: Mod assist Dynamic Standing - Balance Activities: Lateral lean/weight shifting Dynamic Standing - Comments: during stand pivot transfer Extremity/Trunk Assessment RUE Assessment RUE Assessment: Within Functional Limits Active Range of Motion (AROM) Comments: WFL General Strength Comments: RUE FMC and strength slightly decreased, but WFL LUE Assessment LUE Assessment: Within Functional Limits  Care Tool Care Tool Self Care Eating   Eating Assist Level: Set up  assist    Oral Care    Oral Care Assist Level: Set up assist    Bathing   Body parts bathed by patient: Right arm;Left arm;Chest;Abdomen;Front perineal area;Buttocks;Right upper leg;Left upper leg;Right lower leg;Left lower leg;Face     Assist Level: Minimal Assistance - Patient > 75%    Upper Body Dressing(including orthotics)   What is the patient wearing?: Pull over shirt   Assist Level: Minimal Assistance - Patient > 75%    Lower Body Dressing (excluding footwear)   What is the patient wearing?: Incontinence brief Assist for lower body dressing: Total Assistance - Patient < 25%    Putting on/Taking off footwear             Care Tool Toileting Toileting activity   Assist for toileting: Maximal Assistance - Patient 25 - 49%     Care Tool Bed Mobility Roll left and right activity   Roll left and right assist level: Minimal Assistance - Patient > 75%    Sit to lying activity   Sit to lying assist level: Minimal Assistance - Patient > 75%    Lying to sitting edge of bed activity   Lying to sitting edge of bed assist level: Minimal Assistance - Patient > 75%     Care Tool Transfers Sit to stand transfer   Sit to stand assist level: Moderate Assistance - Patient 50 - 74%    Chair/bed transfer   Chair/bed transfer assist level: Moderate Assistance - Patient 50 - 74%     Toilet transfer   Assist Level: Moderate Assistance - Patient 50 - 74%     Care Tool Cognition Expression of Ideas and Wants Expression of Ideas and Wants: Some difficulty - exhibits some difficulty with expressing needs and ideas (e.g, some words or finishing thoughts) or speech is not clear   Understanding Verbal and Non-Verbal Content Understanding Verbal and Non-Verbal Content: Usually understands - understands most conversations, but misses some part/intent of message. Requires cues at times to understand   Memory/Recall Ability *first 3 days only Memory/Recall Ability *first 3 days only: That  he or she is in a hospital/hospital unit;Current season;Staff names and faces    Refer to Care Plan for Long Term Goals  SHORT TERM GOAL WEEK 1 OT Short Term Goal 1 (Week 1): Patient will complete  LB dressing mod A using AE PRN. OT Short Term Goal 2 (Week 1): Patient will complete toilet transfer min A with LRAD. OT Short Term Goal 3 (Week 1): Patient will complete toileting mod A with LRAD. OT Short Term Goal 4 (Week 1): Patient will complete standing ADL using LRAD and min A for balance/safety. OT Short Term Goal 5 (Week 1): Patient will complete ADL task with min vc's for problem-solving.  Recommendations for other services: Neuropsych and Surveyor, mining group, Stress management, and Outing/community reintegration   Skilled Therapeutic Intervention Pt received supine in bed, agreeable to OT session, reporting no pain initially. Education on role of OT, POC, PLOF and home set up/support. Pt demoed flat affect throughout session and apprehensive during transfers/functional mobility. Pt completed ADLs and functional mobility as below. Required mod vc's and tactile cues during stand pivot transfer for initiation of steps in BLEs and to encourage moving as pt became apprehensive and continued to report "I am going to fall." Pt completed toilet transfer due to urinary urgency; pt found incontinent of bladder but completed voiding in [facility]. Brief donned bed level max-total A due to pt fatigue. Further ADL task practice including donning pants and footwear discontinued due to time constraints. Pt remained supine in bed, alarm set, all needs met.   ADL ADL Eating: Set up Where Assessed-Eating: Bed level Grooming: Setup Where Assessed-Grooming: Bed level Upper Body Bathing: Setup Where Assessed-Upper Body Bathing: Sitting at sink Lower Body Bathing: Moderate assistance Where Assessed-Lower Body Bathing: Standing at sink;Sitting at sink Upper Body Dressing: Minimal assistance Where  Assessed-Upper Body Dressing: Sitting at sink Lower Body Dressing: Maximal assistance Where Assessed-Lower Body Dressing: Bed level Toileting: Maximal assistance Where Assessed-Toileting: Bedside Commode Toilet Transfer: Moderate assistance;Moderate verbal cueing Toilet Transfer Method: Stand pivot Toilet Transfer Equipment: Extra wide drop arm bedside commode Tub/Shower Transfer: Not assessed Social research officer, government: Not assessed (Not assessed due to wound vac on LLE) Mobility  Bed Mobility Bed Mobility: Rolling Right;Rolling Left;Sit to Supine;Supine to Sit;Scooting to [facility] Rolling Right: Minimal Assistance - Patient > 75% Rolling Left: Minimal Assistance - Patient > 75% Supine to Sit: Minimal Assistance - Patient > 75% Sit to Supine: Minimal Assistance - Patient > 75% Scooting to HOB: Moderate Assistance - Patient 50-74% Transfers Sit to Stand: Moderate Assistance - Patient 50-74% Stand to Sit: Moderate Assistance - Patient 50-74%   Discharge Criteria: Patient will be discharged from OT if patient refuses treatment 3 consecutive times without medical reason, if treatment goals not met, if there is a change in medical status, if patient makes no progress towards goals or if patient is discharged from hospital.  The above assessment, treatment plan, treatment alternatives and goals were discussed and mutually agreed upon: by patient  Mellissa Kohut 03/20/2021, 5:30 PM

## 2021-03-20 NOTE — Patient Care Conference (Signed)
Inpatient RehabilitationTeam Conference and Plan of Care Update Date: 03/20/2021   Time: 12:14 PM    Patient Name: Kayla Harrison      Medical Record Number: [id number]  Date of Birth: December 25, 1945 Sex: Female         Room/Bed: 4W21C/4W21C-01 Payor Info: Payor: MEDICARE / Plan: MEDICARE PART A AND B / Product Type: *No Product type* /    Admit Date/Time:  03/19/2021  5:06 PM  Primary Diagnosis:  ICH (intracerebral hemorrhage) [facility])  Hospital Problems: Principal Problem:   ICH (intracerebral hemorrhage) ([place])    Expected Discharge Date: Expected Discharge Date: (P)  (two weeks ELOS)  Team Members Present: Physician leading conference: (P) Dr. Alger Simons Care Coodinator Present: (P) Dorien Chihuahua, RN, BSN, CRRN;Becky Dupree, LCSW Nurse Present: (P) Dorien Chihuahua, RN PT Present: Mamie Nick) Magda Kiel, PT OT Present: (P) Other (comment) Hulda Marin, OT) PPS Coordinator present : Mamie Nick) Gunnar Fusi, SLP     Current Status/Progress Goal Weekly Team Focus  Bowel/Bladder             Swallow/Nutrition/ Hydration             ADL's             Mobility             Communication             Safety/Cognition/ Behavioral Observations            Pain             Skin               Discharge Planning:  New evaluation pt from home alone little assist from friends and congreational RN   Team Discussion: Admitted for AMS; dx with left caudate infarct related to HTN not trauma. Patient with chronic left thigh wound and wound vac (due for removal 03/21/21 per MD) and living on her own in ILF with [facility] services PTA.  Patient on target to meet rehab goals: yes, currently min - mod assist stand pivot to [facility]. Continent/incontinent/ Reports pain in lower extremities. Cognition has improved although patient notes memory loss and noted flat affect. Min assist for upper body care and max assist for lower body care.   *See Care Plan and progress notes for long and short-term goals.    Revisions to Treatment Plan:    Teaching Needs: Safety, medications, stroke risk management, skin care, etc.  Current Barriers to Discharge: Incontinence, Wound care, Lack of/limited family support, and Behavior  Possible Resolutions to Barriers: SNF recommended SW to review status with POA and determine best discharge destination     Medical Summary Current Status: caudate hemorrhage, hx of left thigh hematoma/chronic wound, chronic a fib  Barriers to Discharge: Medical stability   Possible Resolutions to [employer] Focus: wound care, maximize sleep wake cycle and cognition, daily assessment of volume status/pt data   Continued Need for Acute Rehabilitation Level of Care: The patient requires daily medical management by a physician with specialized training in physical medicine and rehabilitation for the following reasons: Direction of a multidisciplinary physical rehabilitation program to maximize functional independence : Yes Medical management of patient stability for increased activity during participation in an intensive rehabilitation regime.: Yes Analysis of laboratory values and/or radiology reports with any subsequent need for medication adjustment and/or medical intervention. : Yes   I attest that I was present, lead the team conference, and concur with the assessment and plan of the team.  Margarito Liner 03/20/2021, 3:43 PM

## 2021-03-20 NOTE — Progress Notes (Signed)
PROGRESS NOTE   Subjective/Complaints:  No issues overnite, sister at bedside, pt asking where she is going after rehab  ROS- neg CP, SOB, N/V/D  Objective:   No results found. Recent Labs    03/19/21 0246 03/20/21 0517  WBC 6.3 5.4  HGB 9.2* 10.3*  HCT 30.3* 34.1*  PLT 234 205   Recent Labs    03/19/21 0246 03/20/21 0517  NA 132* 135  K 4.1 3.8  CL 99 99  CO2 24 22  GLUCOSE 92 101*  BUN 15 17  CREATININE 0.80 0.89  CALCIUM 8.7* 8.8*    Intake/Output Summary (Last 24 hours) at 03/20/2021 0856 Last data filed at 03/20/2021 0700 Gross per 24 hour  Intake 360 ml  Output --  Net 360 ml     Pressure Injury 03/15/21 Thigh Left;Medial Stage 2 -  Partial thickness loss of dermis presenting as a shallow open injury with a red, pink wound bed without slough. (Active)  03/15/21 0017  Location: Thigh  Location Orientation: Left;Medial  Staging: Stage 2 -  Partial thickness loss of dermis presenting as a shallow open injury with a red, pink wound bed without slough.  Wound Description (Comments):   Present on Admission: Yes    Physical Exam: Vital Signs Blood pressure 127/74, pulse 70, temperature 98 F (36.7 C), temperature source Oral, resp. rate 16, height 5\' 6"  (1.676 m), weight 87.3 kg, SpO2 98 %.  General: No acute distress Mood and affect are appropriate Heart: Regular rate and rhythm no rubs murmurs or extra sounds Lungs: Clear to auscultation, breathing unlabored, no rales or wheezes Abdomen: Positive bowel sounds, soft nontender to palpation, nondistended Extremities: No clubbing, cyanosis, or edema Skin: Left post thigh wound vac site without erythema Neurologic: Cranial nerves II through XII intact, motor strength is 4/5 in bilateral deltoid, bicep, tricep, grip, 3-/5 Bilateral hip flexor, knee extensors, ankle dorsiflexor and plantar flexor Sensory exam normal sensation to light touch in bilateral  upper and lower extremities  Musculoskeletal: Pain with LLE passive ROM    Assessment/Plan: 1. Functional deficits which require 3+ hours per day of interdisciplinary therapy in a comprehensive inpatient rehab setting. Physiatrist is providing close team supervision and 24 hour management of active medical problems listed below. Physiatrist and rehab team continue to assess barriers to discharge/monitor patient progress toward functional and medical goals  Care Tool:  Bathing              Bathing assist       Upper Body Dressing/Undressing Upper body dressing        Upper body assist Assist Level: Minimal Assistance - Patient > 75%    Lower Body Dressing/Undressing Lower body dressing            Lower body assist       Toileting Toileting    Toileting assist Assist for toileting: Total Assistance - Patient < 25% (purewick)     Transfers Chair/bed transfer  Transfers assist           Locomotion Ambulation   Ambulation assist              Walk 10 feet activity  Assist           Walk 50 feet activity   Assist           Walk 150 feet activity   Assist           Walk 10 feet on uneven surface  activity   Assist           Wheelchair     Assist               Wheelchair 50 feet with 2 turns activity    Assist            Wheelchair 150 feet activity     Assist          Blood pressure 127/74, pulse 70, temperature 98 F (36.7 C), temperature source Oral, resp. rate 16, height 5\' 6"  (1.676 m), weight 87.3 kg, SpO2 98 %.  Medical Problem List and Plan: 1.  Altered mental status secondary to small left caudate head ICH with small IVH secondary hypertension and anticoagulation use             -patient may not yet shower while vac on             -ELOS/Goals: 14-16 days, min assist to supervision goals with PT, OT and mod I to supervision with SLP 2.   Antithrombotics: -DVT/anticoagulation: SCDs             -antiplatelet therapy: N/A 3. Pain Management: Tylenol as needed 4. Mood: Provide emotional support             -antipsychotic agents: N/A 5. Neuropsych: This patient is not capable of making decisions on her own behalf. 7. Fluids/Electrolytes/Nutrition: Routine in and outs with follow-up chemistries 8.  Hypertension.  Lisinopril 10 mg daily.  Monitor with increased mobility 9.  Atrial fibrillation/pacemaker.  Amiodarone 200 mg twice daily.   Eliquis discontinued due to [place] 10.  Hypothyroidism.  Synthroid 11.  Chronic left thigh wound.  Followed by Dr. Lucia Gaskins.  WOC following while in the hospital.  Dressing changes as directed.   -Wound VAC to be discontinued 03/21/2021 12.  NHL with SVC syndrome/lung cancer radiation therapy as well as chemo.  Followed by Dr.Gudena 13.  Obesity.  Status post lap band surgery.  BMI 32.10.  Dietary follow-up 14.  Acute on chronic anemia.  Follow-up CBC on admit labs    LOS: 1 days A FACE TO FACE EVALUATION WAS PERFORMED  Charlett Blake 03/20/2021, 8:56 AM

## 2021-03-21 ENCOUNTER — Telehealth: Payer: Self-pay | Admitting: *Deleted

## 2021-03-21 MED ORDER — ASPIRIN EC 81 MG PO TBEC
81.0000 mg | DELAYED_RELEASE_TABLET | Freq: Every day | ORAL | Status: DC
  Administered 2021-03-21 – 2021-04-03 (×14): 81 mg via ORAL
  Filled 2021-03-21 (×14): qty 1

## 2021-03-21 NOTE — Progress Notes (Signed)
Physical Therapy Session Note  Patient Details  Name: Kayla Harrison MRN: [id number] Date of Birth: 1946/05/20  Today's Date: 03/21/2021 PT Individual Time: 8721-5872 PT Individual Time Calculation (min): 44 min   Short Term Goals: Week 1:  PT Short Term Goal 1 (Week 1): Patient will perform bed mobility with S PT Short Term Goal 2 (Week 1): Patient will transfer sit to stand with CGA. PT Short Term Goal 3 (Week 1): Patient will ambulate x 100' with min A. PT Short Term Goal 4 (Week 1): Patient negotiate at least 1 step with min A.  Skilled Therapeutic Interventions/Progress Updates:   Pt received sitting in WC and agreeable to PT  Pt transported to day roomin in [place]. Stand pivot transfer to nustep with min assist and RW.   Nustep reciprocal movement training and activity tolerance training x 6 min Borg RPE 17/20. Cues for improved ROM provided motion kept in pain free range.   Gait training with RW x 23ft +67ft and min assist from PT for safety and AD management in turn, mild L knee pain reported for last 3ft of first bout of gait training.  Pt returned to room and performed stand pivot transfer to bed with RW and min assist. Sit>supine completed with CGA for BLE management, and left supine in bed with call bell in reach and all needs met.        Therapy Documentation Precautions:  Precautions Precautions: Fall Precaution Comments: L hip wound vac, watch for orthostatic hypotension, contact precautions Restrictions Weight Bearing Restrictions: No LUE Weight Bearing: Touch down weight bearing LLE Weight Bearing: Weight bearing as tolerated   Pain: Pain Assessment Pain Scale: 0-10 Pain Score: 0-No pain Faces Pain Scale: No hurt at rest.      Therapy/Group: Individual Therapy  Lorie Phenix 03/21/2021, 12:11 PM

## 2021-03-21 NOTE — Progress Notes (Signed)
Speech Language Pathology Daily Session Note  Patient Details  Name: Kayla Harrison MRN: [id number] Date of Birth: [birth year]  Today's Date: 03/21/2021 SLP Individual Time: 0832-0900 SLP Individual Time Calculation (min): 28 min  Short Term Goals: Week 1: SLP Short Term Goal 1 (Week 1): Patient will demonstrate functional problem solving for mildly complex tasks with Mod verbal cues. SLP Short Term Goal 2 (Week 1): Patient will recall new, daily information with Mod A multimodal cues for use of compnesatory strategies. SLP Short Term Goal 3 (Week 1): Patient will demosntrate selective attention to tasks in a mildly distracting enviornment for 30 minutes with Min verbal cues for redirection. SLP Short Term Goal 4 (Week 1): Patient will self-monitor and correct errors during functional tasks with Mod A verbal cues.  Skilled Therapeutic Interventions:Skilled ST services focused on cognitive skills. SLP facilitated mildly complex problem solving, error awareness and use of recall strategies within task utilizing ALFA daily math questions. Pt was able to answered 4 out 8 questions mod I, with increase accuracy with use of written aid to assist with working memory and ability to correct errors in 7 out 8 opportunities.  Pt was left in room with call bell within reach and bed alarm set. SLP recommends to continue skilled services.     Pain Pain Assessment Pain Scale: 0-10 Pain Score: 0-No pain Faces Pain Scale: No hurt  Therapy/Group: Individual Therapy  Theo Krumholz  [facility] 03/21/2021, 1:04 PM

## 2021-03-21 NOTE — Progress Notes (Signed)
Physical Therapy Session Note  Patient Details  Name: Kayla Harrison MRN: [id number] Date of Birth: [birth year]  Today's Date: 03/21/2021 PT Individual Time: 1611-1706 PT Individual Time Calculation (min): 55 min   Short Term Goals: Week 1:  PT Short Term Goal 1 (Week 1): Patient will perform bed mobility with S PT Short Term Goal 2 (Week 1): Patient will transfer sit to stand with CGA. PT Short Term Goal 3 (Week 1): Patient will ambulate x 100' with min A. PT Short Term Goal 4 (Week 1): Patient negotiate at least 1 step with min A.  Skilled Therapeutic Interventions/Progress Updates:    Pt received supine, asleep in bed and easily awakens - pt agreeable to therapy session. Pt already wearing thigh high TED hose. Supine>sitting R EOB, HOB slightly elevated and using bedrails, with supervision until the end when requiring min assist via bed pad to pivot hips to EOB - requires significantly increased time to complete this task with cuing and encouragement for increased independence and sequencing. Pt reports sudden need to use bathroom. L stand pivot EOB>BSC using RW with min assist for coming to stand and CGA for steadying while turning - total assist LB clothing management due to urgency. Continent of bladder - standing with B UE support on RW and CGA/supervision for balance during total assist peri-care. Pt demos slight excessive B knee flexion during static stance but no instability noted. R stand pivot BSC>EOB using RW with CGA - pt moves very slowly but with increased time is able to perform mobility tasks with decreased assist (especially when coming to stand). Short distance ~33ft ambulatory transfer EOB>w/c using RW with CGA . Transported to/from gym in w/c for time management and energy conservation. Sit<>stand sremainder of therapy session using RW with CGA for safety. Gait training 24ft, 63ft, and 56ft using RW (seated break between each) with CGA for safety - demos slow gait speed but  reciprocal stepping pattern and adequate, though decreased foot clearance - after 2nd walk required seated rest break due to "lightheadedness." Vitals below - no orthostatics noted during session (pt wearing thigh high TED hose throughout). Transported back to room and pt agreeable to remain sitting in w/c. Left with needs in reach and seat belt alarm on.   Vitals:  Supine: BP 123/68 (MAP 84), HR 70bpm  After bathroom: BP 121/75 (MAP 87), HR 71bpm  After 2nd gait training trial: BP 126/73 (MAP 88), HR 69bpm   Therapy Documentation Precautions:  Precautions Precautions: Fall Precaution Comments: L hip wound vac, watch for orthostatic hypotension, contact precautions Restrictions Weight Bearing Restrictions: No LUE Weight Bearing: Touch down weight bearing LLE Weight Bearing: Weight bearing as tolerated   Pain: Reports some discomfort with increased B knee flexion in sitting due to hx of chronic limited knee ROM with pain - repositioned for pain management.   Therapy/Group: Individual Therapy  Tawana Scale , PT, DPT, CSRS 03/21/2021, 2:12 PM

## 2021-03-21 NOTE — Telephone Encounter (Signed)
Patient's sister, Benn Moulder [facility] telephoned CN to request CN visit patient on 03/22/21 so CN can assist patient in calling [place].  [name] has not been able to reach patient in 2 days on patient's cell as voicemail is full and she usually speaks with patient daily.  CN agreed to visit patient on 03/22/21 and will have coworker J. Church accompany.

## 2021-03-21 NOTE — Final Consult Note (Signed)
[facility] Nurse wound follow up Patient receiving care in [facility] [phone] Per Dr. Lucia Gaskins, he is in agreement to discontinue the vac today and go with dressing changes twice a day. Sutures removed, skin intact.  Wound type: Surgical Measurement: 0.5 cm x 0.5 cm x 1 cm Wound bed:  Drainage (amount, consistency, odor) 120 cc sanguinous drainage in canister since last Friday 03/16/21 Periwound: Intact Dressing procedure/placement/frequency: Insert 1" Iodoform gauze into the left upper outer thigh wound leaving a tail for easy removal. Cover with dry gauze and foam dressing. Change the iodoform gauze and dry gauze each shift. Change foam dressing every 3 days or PRN soiling. Continue Santyl to the left upper inner thigh wound in a nickel thick layer. Cover with a saline moistened gauze, then dry gauze and foam dressing.  Change daily. Please page if any questions or concerns  WOC will not follow at this time.  Cathlean Marseilles Tamala Julian, MSN, RN, [place], Lysle Pearl, [facility] Wound Treatment Associate Pager [phone]

## 2021-03-21 NOTE — Progress Notes (Signed)
Occupational Therapy Session Note  Patient Details  Name: Kayla Harrison MRN: [id number] Date of Birth: 1945-12-12  Today's Date: 03/21/2021 OT Individual Time: 0634-9494 OT Individual Time Calculation (min): 57 min    Short Term Goals: Week 1:  OT Short Term Goal 1 (Week 1): Patient will complete LB dressing mod A using AE PRN. OT Short Term Goal 2 (Week 1): Patient will complete toilet transfer min A with LRAD. OT Short Term Goal 3 (Week 1): Patient will complete toileting mod A with LRAD. OT Short Term Goal 4 (Week 1): Patient will complete standing ADL using LRAD and min A for balance/safety. OT Short Term Goal 5 (Week 1): Patient will complete ADL task with min vc's for problem-solving.  Skilled Therapeutic Interventions/Progress Updates:    Pt received supine in bed, asleep but aroused with min verbal and tactile cues (continued to fall asleep until sitting EOB), agreeable to OT. Pt declined ADLs stating "I have already done that this morning" and reporting fatigue from working a lot this morning (only therapy prior was with SLP). Session focused on functional transfers, general conditioning, visual perception, problem solving, and attention. Sup>sit with HOB elevated min A using bed rails and increased time. Pt reported 7/10 pain in bilateral knees during scooting to EOB. Sit<>stand min A with RW and mod-max vc's for hand placement throughout session. Stand step transfer with RW bed<>w/c<>therapy mat min A with no vc's. Pt pushed to ortho gym for time. Completed core and BUE conditioning activities seated on therapy mat with BLEs supported using 1kg ball: modified russian twists 20x, chest press 10x, overhead press 10x with min vc's for technique and no rest breaks. Pt engaged in cognitive/visual perception table top task creating 3D model from visual aide with manipulatives (pipe tree). Task required dynamic sitting balance and reaching outside BOS - completed with close spvsn and no  LOB. Pt required combo of sustained and selective attention in minimally distracting environment; pt but occasionally distracted to others but no vc's required to redirect. Pt required min questioning cues completing task, but demoed ~50% errors in task completion. Pt became fatigued and requested to end task. Min A stand step transfer with RW w/c>bed and close spvsn sit>sup. Pt remained supine, alarm set, all needs met.  Therapy Documentation Precautions:  Precautions Precautions: Fall Precaution Comments: L hip wound vac, watch for orthostatic hypotension, contact precautions Restrictions Weight Bearing Restrictions: No LUE Weight Bearing: Touch down weight bearing LLE Weight Bearing: Weight bearing as tolerated  Pain: Pain Assessment Pain Scale: 0-10 Pain Score: 7  Pain Type: Acute pain Pain Location: Knee Pain Orientation: Left;Right Pain Descriptors / Indicators: Aching;Sharp Pain Onset: With Activity Pain Intervention(s): Repositioned;Distraction;Rest Multiple Pain Sites: No  Therapy/Group: Individual Therapy  Mellissa Kohut 03/21/2021, 3:56 PM

## 2021-03-21 NOTE — Progress Notes (Addendum)
PROGRESS NOTE   Subjective/Complaints:  Appreciate WOC note, wound vac d/ced , labs reviewed   ROS- neg CP, SOB, N/V/D  Objective:   No results found. Recent Labs    03/19/21 0246 03/20/21 0517  WBC 6.3 5.4  HGB 9.2* 10.3*  HCT 30.3* 34.1*  PLT 234 205    Recent Labs    03/19/21 0246 03/20/21 0517  NA 132* 135  K 4.1 3.8  CL 99 99  CO2 24 22  GLUCOSE 92 101*  BUN 15 17  CREATININE 0.80 0.89  CALCIUM 8.7* 8.8*     Intake/Output Summary (Last 24 hours) at 03/21/2021 1115 Last data filed at 03/21/2021 7062 Gross per 24 hour  Intake 360 ml  Output --  Net 360 ml      Pressure Injury 03/15/21 Thigh Left;Medial Stage 2 -  Partial thickness loss of dermis presenting as a shallow open injury with a red, pink wound bed without slough. (Active)  03/15/21 0017  Location: Thigh  Location Orientation: Left;Medial  Staging: Stage 2 -  Partial thickness loss of dermis presenting as a shallow open injury with a red, pink wound bed without slough.  Wound Description (Comments):   Present on Admission: Yes    Physical Exam: Vital Signs Blood pressure (!) 97/57, pulse 63, temperature 97.6 F (36.4 C), temperature source Oral, resp. rate 20, height 5\' 6"  (1.676 m), weight 87.5 kg, SpO2 97 %.   General: No acute distress Mood and affect are appropriate Heart: Regular rate and rhythm no rubs murmurs or extra sounds Lungs: Clear to auscultation, breathing unlabored, no rales or wheezes Abdomen: Positive bowel sounds, soft nontender to palpation, nondistended Extremities: No clubbing, cyanosis, or edema   Skin: Left post thigh wound vac site with dressing (vac removed)  Neurologic: Cranial nerves II through XII intact, motor strength is 4/5 in bilateral deltoid, bicep, tricep, grip, 3-/5 Bilateral hip flexor, knee extensors, ankle dorsiflexor and plantar flexor Sensory exam normal sensation to light touch in  bilateral upper and lower extremities  Musculoskeletal: Pain with LLE passive ROM    Assessment/Plan: 1. Functional deficits which require 3+ hours per day of interdisciplinary therapy in a comprehensive inpatient rehab setting. Physiatrist is providing close team supervision and 24 hour management of active medical problems listed below. Physiatrist and rehab team continue to assess barriers to discharge/monitor patient progress toward functional and medical goals  Care Tool:  Bathing    Body parts bathed by patient: Right arm, Left arm, Chest, Abdomen, Front perineal area, Buttocks, Right upper leg, Left upper leg, Right lower leg, Left lower leg, Face         Bathing assist Assist Level: Minimal Assistance - Patient > 75%     Upper Body Dressing/Undressing Upper body dressing   What is the patient wearing?: Pull over shirt    Upper body assist Assist Level: Minimal Assistance - Patient > 75%    Lower Body Dressing/Undressing Lower body dressing      What is the patient wearing?: Incontinence brief     Lower body assist Assist for lower body dressing: Total Assistance - Patient < 25%     Toileting Toileting  Toileting assist Assist for toileting: Maximal Assistance - Patient 25 - 49%     Transfers Chair/bed transfer  Transfers assist     Chair/bed transfer assist level: Minimal Assistance - Patient > 75% Chair/bed transfer assistive device: Programmer, multimedia   Ambulation assist      Assist level: Moderate Assistance - Patient 50 - 74% Assistive device: Walker-rolling Max distance: 30'   Walk 10 feet activity   Assist     Assist level: Moderate Assistance - Patient - 50 - 74% Assistive device: Walker-rolling   Walk 50 feet activity   Assist Walk 50 feet with 2 turns activity did not occur: Safety/medical concerns         Walk 150 feet activity   Assist Walk 150 feet activity did not occur: Safety/medical  concerns         Walk 10 feet on uneven surface  activity   Assist Walk 10 feet on uneven surfaces activity did not occur: Safety/medical concerns         Wheelchair     Assist Will patient use wheelchair at discharge?: No   Wheelchair activity did not occur: Safety/medical concerns         Wheelchair 50 feet with 2 turns activity    Assist    Wheelchair 50 feet with 2 turns activity did not occur: Safety/medical concerns       Wheelchair 150 feet activity     Assist  Wheelchair 150 feet activity did not occur: Safety/medical concerns       Blood pressure (!) 97/57, pulse 63, temperature 97.6 F (36.4 C), temperature source Oral, resp. rate 20, height 5\' 6"  (1.676 m), weight 87.5 kg, SpO2 97 %.  Medical Problem List and Plan: 1.  Altered mental status secondary to small left caudate head ICH with small IVH secondary hypertension and anticoagulation use             -patient may shower with thigh wounds covered              -ELOS/Goals: 14-16 days, min assist to supervision goals with PT, OT and mod I to supervision with SLP 2.  Antithrombotics: -DVT/anticoagulation: SCDs             -antiplatelet therapy: per Dr Leonie Man ok to start ASA 81mg  today  3. Pain Management: Tylenol as needed 4. Mood: Provide emotional support             -antipsychotic agents: N/A 5. Neuropsych: This patient is not capable of making decisions on her own behalf. 7. Fluids/Electrolytes/Nutrition: Routine in and outs with follow-up chemistries 8.  Hypertension.  Lisinopril 10 mg daily.  Monitor with increased mobility 9.  Atrial fibrillation/pacemaker.  Amiodarone 200 mg twice daily.   Eliquis discontinued due to Exline 10.  Hypothyroidism.  Synthroid 11.  Chronic left thigh wound.  Followed by Dr. Lucia Gaskins.  WOC following while in the hospital.  Dressing changes as directed.   -Wound VAC discontinued 03/21/2021 12.  NHL with SVC syndrome/lung cancer radiation therapy as well as  chemo.  Followed by Dr.Gudena 13.  Obesity.  Status post lap band surgery.  BMI 32.10.  Dietary follow-up 14.  Acute on chronic anemia.  Follow-up CBC Hbg improved to 10.3    LOS: 2 days A FACE TO FACE EVALUATION WAS PERFORMED  Charlett Blake 03/21/2021, 11:15 AM

## 2021-03-21 NOTE — Progress Notes (Signed)
Patient ID: Kayla Harrison, female   DOB: 10/24/1945, 75 y.o.   MRN: [id number] Follow up with the patient regarding role of the nurse CM and review educational needs. Patient with flat affect noted memory issues regarding the situation and visual changes since the event. Felt like she was doing well at home PTA and taking her medications. Vaguely recalls discussion about changing doses of medication however reports she is concerned that she had no warning signs or alerts prior to the [place]. Felt she had been doing everything correctly and denies fall or head trauma. Reviewed medications and diet with tips for HH, low salt. Reviewed staff will go over this information again with her and designated party once the decision for discharge destination has been cleared. Continue to follow along to discharge to address questions about medications, dietary modifications , etc and collaborate with the SW to facilitate preparation for discharge. .me

## 2021-03-22 ENCOUNTER — Encounter: Payer: Medicare Other | Admitting: Internal Medicine

## 2021-03-22 ENCOUNTER — Encounter: Payer: Self-pay | Admitting: *Deleted

## 2021-03-22 ENCOUNTER — Telehealth: Payer: Self-pay | Admitting: *Deleted

## 2021-03-22 MED ORDER — SENNOSIDES-DOCUSATE SODIUM 8.6-50 MG PO TABS
2.0000 | ORAL_TABLET | Freq: Two times a day (BID) | ORAL | Status: DC
  Administered 2021-03-22 – 2021-04-03 (×23): 2 via ORAL
  Filled 2021-03-22 (×24): qty 2

## 2021-03-22 MED ORDER — POLYETHYLENE GLYCOL 3350 17 G PO PACK
17.0000 g | PACK | Freq: Every day | ORAL | Status: DC
  Administered 2021-03-22 – 2021-04-03 (×11): 17 g via ORAL
  Filled 2021-03-22 (×12): qty 1

## 2021-03-22 NOTE — Progress Notes (Signed)
Speech Language Pathology Daily Session Note  Patient Details  Name: Kayla Harrison MRN: [id number] Date of Birth: November 11, 1945  Today's Date: 03/22/2021 SLP Individual Time: 1015-1045 SLP Individual Time Calculation (min): 30 min  Short Term Goals: Week 1: SLP Short Term Goal 1 (Week 1): Patient will demonstrate functional problem solving for mildly complex tasks with Mod verbal cues. SLP Short Term Goal 2 (Week 1): Patient will recall new, daily information with Mod A multimodal cues for use of compnesatory strategies. SLP Short Term Goal 3 (Week 1): Patient will demosntrate selective attention to tasks in a mildly distracting enviornment for 30 minutes with Min verbal cues for redirection. SLP Short Term Goal 4 (Week 1): Patient will self-monitor and correct errors during functional tasks with Mod A verbal cues.  Skilled Therapeutic Interventions:   Patient seen for skilled ST session focusing on cognitive function goals. Patient alert, sitting in recliner and agreeable to session. SLP introduced alternating attention task of using calorie counting booklet to find calories for each item in a recipe. Patient required modA cues overall to direct to correct section in book and to recall the item she was searching for. She did improve briefly to min-modA level but then declined back to [facility] as task progressed. Patient continues to benefit from skilled SLP intervention to maximize cognitive-linguistic function prior to discharge.  Pain Pain Assessment Pain Scale: 0-10 Pain Score:  (unrated) Faces Pain Scale: No hurt Pain Type: Acute pain Pain Location: Knee Pain Orientation: Right;Left Pain Descriptors / Indicators: Aching Pain Onset: With Activity Pain Intervention(s): Distraction;Repositioned Multiple Pain Sites: No  Therapy/Group: Individual Therapy  Sonia Baller, MA, CCC-SLP Speech Therapy

## 2021-03-22 NOTE — Telephone Encounter (Signed)
CN responded to Benn Moulder Schaick's (sister) questions about about SNFs list that she received from [place] worker.

## 2021-03-22 NOTE — IPOC Note (Signed)
Overall Plan of Care [facility]) Patient Details Name: Kayla Harrison MRN: [id number] DOB: [birth year]  Admitting Diagnosis: ICH (intracerebral hemorrhage) [facility])  Hospital Problems: Principal Problem:   ICH (intracerebral hemorrhage) ([place])     Functional Problem List: Nursing Bladder, Medication Management, Safety, Skin Integrity, Endurance, Edema, Bowel, Pain  PT Balance, Pain, Endurance, Motor, Skin Integrity, Edema, Safety  OT Balance, Cognition, Edema, Endurance, Motor, Pain, Sensory, Safety  SLP Cognition  TR         Basic ADL's: OT Bathing, Dressing, Toileting     Advanced  ADL's: OT Simple Meal Preparation, Light Housekeeping, Laundry     Transfers: PT Bed Mobility, Bed to Chair, Teacher, early years/pre, Tub/Shower     Locomotion: PT Ambulation, Emergency planning/management officer, Stairs     Additional Impairments: OT    SLP Social Cognition   Problem Solving, Memory, Attention, Awareness  TR      Anticipated Outcomes Item Anticipated Outcome  Self Feeding NA  Swallowing      Basic self-care  mod I  Toileting  mod I   Bathroom Transfers mod I  Bowel/Bladder  bowel with mod I and bladder independently  Transfers  S  Locomotion  S  Communication     Cognition  Supervision  Pain  at or below level 4  Safety/Judgment  maintain safety with cues/reminders   Therapy Plan: PT Intensity: Minimum of 1-2 x/day ,45 to 90 minutes PT Frequency: 5 out of 7 days PT Duration Estimated Length of Stay: 14-17 days OT Intensity: Minimum of 1-2 x/day, 45 to 90 minutes OT Frequency: 5 out of 7 days OT Duration/Estimated Length of Stay: 14-16 days SLP Intensity: Minumum of 1-2 x/day, 30 to 90 minutes SLP Frequency: 3 to 5 out of 7 days SLP Duration/Estimated Length of Stay: 2-2.5 weeks   Due to the current state of emergency, patients may not be receiving their 3-hours of Medicare-mandated therapy.   Team Interventions: Nursing Interventions Patient/Family Education, Bowel  Management, Pain Management, Skin Care/Wound Management, Discharge Planning, Medication Management, Disease Management/Prevention, Bladder Management  PT interventions Ambulation/gait training, Community reintegration, DME/adaptive equipment instruction, Neuromuscular re-education, Psychosocial support, Stair training, UE/LE Strength taining/ROM, Wheelchair propulsion/positioning, UE/LE Coordination activities, Therapeutic Activities, Skin care/wound management, Functional electrical stimulation, Pain management, Discharge planning, Balance/vestibular training, Disease management/prevention, Functional mobility training, Patient/family education, Splinting/orthotics, Therapeutic Exercise, Cognitive remediation/compensation  OT Interventions Balance/vestibular training, Discharge planning, DME/adaptive equipment instruction, Therapeutic Exercise, UE/LE Strength taining/ROM, Therapeutic Activities, Skin care/wound managment, Self Care/advanced ADL retraining, Pain management, Patient/family education, Psychosocial support, Functional mobility training, UE/LE Coordination activities, Community reintegration, Cognitive remediation/compensation  SLP Interventions Cognitive remediation/compensation, Internal/external aids, Therapeutic Activities, Environmental controls, Cueing hierarchy, Functional tasks, Patient/family education  TR Interventions    SW/CM Interventions Discharge Planning, Psychosocial Support, Patient/Family Education   Barriers to Discharge MD  Medical stability, Wound care, and Weight  Nursing Decreased caregiver support, Wound Care, Lack of/limited family support 1 level ILF alone, skin care  PT Decreased caregiver support, Lack of/limited family support, Wound Care orthostatic on eval, lives alone  OT Decreased caregiver support, Weight, Lack of/limited family support, Wound Care    SLP      SW Lack of/limited family support, Decreased caregiver support     Team Discharge  Planning: Destination: PT-Home (unless will not have support, pt unsure) ,OT- Home , SLP-Home Projected Follow-up: PT-Home health PT, 24 hour supervision/assistance, OT-  Home health OT, SLP-Home Health SLP, Outpatient SLP, 24 hour supervision/assistance Projected Equipment Needs: PT-To be determined, OT- To be  determined, SLP-None recommended by SLP Equipment Details: PT-has rollator, OT-  Patient/family involved in discharge planning: PT- Patient,  OT-Patient, SLP-Patient  MD ELOS: 14-16d Medical Rehab Prognosis:  Good Assessment: 75 year old right-handed female well-known to rehab services from admission 02/13/2021 to 02/23/2021 for debility secondary to thigh wound hematoma status post I&D 02/06/2021 with wound VAC placement and drain as well as history of thyroid cancer, hypothyroidism, CVA,NHL/SVC syndrome/lung cancer radiation therapy as well as chemotherapy 2018 followed by Dr.Gudena, pacemaker placement for atrial fibrillation maintained on Eliquis followed by Dr. Caryl Comes, obesity status post lap band surgery hypertension hyperlipidemia and recurrent falls.  Per chart review patient lives alone independent with assistive device 1 level apartment with level entry.  She does have good family support.  Presented 03/14/2021 with altered mental status found down after she reportedly slipped off the bed.  No loss of consciousness.  Admission chemistries urine drug screen negative, glucose 111, hemoglobin 10.6, SARS coronavirus negative, CK 41. Cranial CT scan showed acute intracranial hemorrhage extending from the region of the left caudothalamic groove into the left lateral ventricle and through the left foramen Monro into the third ventricle.  There was mild dilation of the left lateral ventricle approxi-5 mm left to right midline shift.  Findings indicate of of hypertensive in nature or secondary to rupture of a-comm aneurysm.  CT angiogram of head and neck no large vessel occlusion.  No significant stenosis  of the neck.  No aneurysm or visible vascular malformation in the region of the patient's known hemorrhage.  Neurology consulted and her Eliquis was reversed and and discontinued due to [place] and currently awaiting plan for possibly beginning Lovenox.  Echocardiogram completed showing ejection fraction of 55 to 60% no wall motion abnormalities.  In regards to patient's chronic left thigh wound status post wound VAC followed by Dr. Lucia Gaskins as well as the wound center clinic Dr. Heber [place] and currently being followed by W OC while in the hospital.      Now requiring 24/7 Rehab RN,MD, as well as CIR level PT, OT and SLP.  Treatment team will focus on ADLs and mobility with goals set at [place] Team Conference Notes for weekly updates to the plan of care

## 2021-03-22 NOTE — Progress Notes (Signed)
Occupational Therapy Session Note  Patient Details  Name: Kayla Harrison MRN: [id number] Date of Birth: 1945-12-11  Today's Date: 03/22/2021 OT Individual Time: 5188-4166 and 0630-1601 OT Individual Time Calculation (min): 55 min and 70 min   Short Term Goals: Week 1:  OT Short Term Goal 1 (Week 1): Patient will complete LB dressing mod A using AE PRN. OT Short Term Goal 2 (Week 1): Patient will complete toilet transfer min A with LRAD. OT Short Term Goal 3 (Week 1): Patient will complete toileting mod A with LRAD. OT Short Term Goal 4 (Week 1): Patient will complete standing ADL using LRAD and min A for balance/safety. OT Short Term Goal 5 (Week 1): Patient will complete ADL task with min vc's for problem-solving.  Skilled Therapeutic Interventions/Progress Updates:    Session 1: Pt received supine and asleep, easy to arouse with voice, agreeable to OT, reporting no pain initially. Dependent to don thigh high ted hose. Pt demoed increased confusion this session reporting it was February, Tuesday, and answering personal background questions with conflicting answers (i.e. early in session reporting she has children, later reporting she does not). Pt perseverated throughout session on when she would be leaving hospital and demoed errors on how long she had been here. Sup>sit min A and extra time using bed rails. Sit<>stands throughout session min-mod A for boost upright. Stand pivot transfer bed>BSC min A with RW for urinary urgency. Incontinent and continent of bladder; pt did not scoot back far enough on BSC and spilled on floor; spill cleaned and sanitized. Pt ambulated 5' BSC>w/c with RW CGA. Completed UB bathing at sink set up A, UB dressing min A donning; when pt's shirt was off for bathing, pt became distracted and started brushing teeth set up A. Pt perseverating slightly on brushing teeth and rinsing, questioning cue to terminate task practice. LB dressing mod A for threading and pulling  over hips in stance. Pt remained in w/c, alarm set, all needs met, ready for hand off to SLP.  Session 2: Pt received in w/c, agreeable to OT, reporting no pain. Pt requested to wash hair; completed with shampoo tray seated in w/c. Pt combed hair set up A. Pt requested to go outside for fresh air, focusing on psychosocial health. Pt propelled with assistance due to time. Pt engaged in visual perceptual and problem-solving task with a puzzle in moderately distracting environment. Mod multimodal cues to regain attention from distractions throughout session. Pt first asked to sort pieces from edges and non-edges; required mod questioning cues to complete and demoed multiple errors; unable to discern if due to recall or attention limitations. Pt demoed visual perceptual skill difficulties, only correctly connecting 4-5 edge pieces together with max vc's (even after downgrading to fewer choices/options). Pt became fatigued with task. Sit<>stand min A-CGA standing for 15 seconds before requiring seated rest break, reported feeling slightly dizzy improving upon return to indoors. Pt returned to room with sister present, remaining in w/c with alarm set, call bell in reach, and all needs met.  Therapy Documentation Precautions:  Precautions Precautions: Fall Precaution Comments: L hip wound vac, watch for orthostatic hypotension, contact precautions Restrictions Weight Bearing Restrictions: No LUE Weight Bearing: Touch down weight bearing LLE Weight Bearing: Weight bearing as tolerated  Pain: Pain Assessment Pain Scale: 0-10 Pain Score:  (unrated) Pain Type: Acute pain Pain Location: Knee Pain Orientation: Right;Left Pain Descriptors / Indicators: Aching Pain Onset: With Activity Pain Intervention(s): Distraction;Repositioned Multiple Pain Sites: No  Therapy/Group: Individual Therapy  Mellissa Kohut 03/22/2021, 12:56 PM

## 2021-03-22 NOTE — Congregational Nurse Program (Signed)
CN visited patient with coworker Burnett Corrente at request of patient's sister, Gustavus Messing to foster communication between patient and [name].  [name] has not been able to reach patient by cell phone as all calls had been going to voice mail.  CNs were able to learn source of problem, correct the problem and alleviate stress for patient's sister in not being able to reach patient.  Benn Moulder Schaick expressed gratitude to CNs for efforts in correcting situation.

## 2021-03-22 NOTE — Progress Notes (Signed)
Physical Therapy Session Note  Patient Details  Name: Kayla Harrison MRN: [id number] Date of Birth: 14-May-1946  Today's Date: 03/22/2021 PT Individual Time: 1117-1200 PT Individual Time Calculation (min): 43 min   Short Term Goals: Week 1:  PT Short Term Goal 1 (Week 1): Patient will perform bed mobility with S PT Short Term Goal 2 (Week 1): Patient will transfer sit to stand with CGA. PT Short Term Goal 3 (Week 1): Patient will ambulate x 100' with min A. PT Short Term Goal 4 (Week 1): Patient negotiate at least 1 step with min A.  Skilled Therapeutic Interventions/Progress Updates:   Received pt sitting in WC, pt agreeable to PT session, and reported L lateral thigh and increased bilateral knee pain with mobility (un-rated) but declined any pain interventions. Repositioning, rest breaks, and distraction done to reduce pain levels. Pt recognized this therapist's face from previous 2 hospital stays but unable to recall name and appeared to have trouble with word finding and delayed responses/reactions throughout session. Session with emphasis on functional mobility/transfers, generalized strengthening, dynamic standing balance/coordination, gait training, NMR, and improved activity tolerance. Pt transported to dayroom in [facility] total A for time management purposes and performed sit<>stands with RW and min A throughout session. Pt ambulated 70ft x 1 and 35ft x 1 with RW and CGA/min A. Pt demonstrated bilateral knee flexion in stance, downward gaze, decreased cadence with wide BOS, and decreased bilateral foot clearance. Performed x 2 stand<>pivots with RW and CGA with cues for eccentric control when sitting. Worked on dynamic standing balance, problem solving, spatial awareness, and visual scanning re-creating picture on pegboard x 1 trial with CGA for balance and 1 visual cue. Pt able to stand for 2 minutes prior to requesting to sit and finish activity 2/2 fatigue. Pt transported back to room in [facility]  total A. Concluded session with pt sitting in WC, needs within reach, and seatbelt alarm on.   Therapy Documentation Precautions:  Precautions Precautions: Fall Precaution Comments: L hip wound vac, watch for orthostatic hypotension, contact precautions Restrictions Weight Bearing Restrictions: No LUE Weight Bearing: Touch down weight bearing LLE Weight Bearing: Weight bearing as tolerated  Therapy/Group: Individual Therapy Alfonse Alpers PT, DPT   03/22/2021, 7:17 AM

## 2021-03-22 NOTE — Progress Notes (Signed)
PROGRESS NOTE   Subjective/Complaints:  Discussed potential etiology of ICH  No new c/os  ROS- neg CP, SOB, N/V/D  Objective:   No results found. Recent Labs    03/20/21 0517  WBC 5.4  HGB 10.3*  HCT 34.1*  PLT 205    Recent Labs    03/20/21 0517  NA 135  K 3.8  CL 99  CO2 22  GLUCOSE 101*  BUN 17  CREATININE 0.89  CALCIUM 8.8*     Intake/Output Summary (Last 24 hours) at 03/22/2021 0845 Last data filed at 03/22/2021 9373 Gross per 24 hour  Intake 480 ml  Output --  Net 480 ml      Pressure Injury 03/15/21 Thigh Left;Medial Stage 2 -  Partial thickness loss of dermis presenting as a shallow open injury with a red, pink wound bed without slough. (Active)  03/15/21 0017  Location: Thigh  Location Orientation: Left;Medial  Staging: Stage 2 -  Partial thickness loss of dermis presenting as a shallow open injury with a red, pink wound bed without slough.  Wound Description (Comments):   Present on Admission: Yes    Physical Exam: Vital Signs Blood pressure 128/60, pulse 70, temperature 97.7 F (36.5 C), resp. rate 18, height 5\' 6"  (1.676 m), weight 89.7 kg, SpO2 97 %.   General: No acute distress Mood and affect are appropriate Heart: Regular rate and rhythm no rubs murmurs or extra sounds Lungs: Clear to auscultation, breathing unlabored, no rales or wheezes Abdomen: Positive bowel sounds, soft nontender to palpation, nondistended Extremities: No clubbing, cyanosis, or edema   Skin: Left post thigh wound vac site with dressing (vac removed)  Neurologic: Cranial nerves II through XII intact, motor strength is 4/5 in bilateral deltoid, bicep, tricep, grip, 3-/5 Bilateral hip flexor, knee extensors, ankle dorsiflexor and plantar flexor Sensory exam normal sensation to light touch in bilateral upper and lower extremities  Musculoskeletal: Pain with LLE passive ROM    Assessment/Plan: 1.  Functional deficits which require 3+ hours per day of interdisciplinary therapy in a comprehensive inpatient rehab setting. Physiatrist is providing close team supervision and 24 hour management of active medical problems listed below. Physiatrist and rehab team continue to assess barriers to discharge/monitor patient progress toward functional and medical goals  Care Tool:  Bathing    Body parts bathed by patient: Right arm, Left arm, Chest, Abdomen, Front perineal area, Buttocks, Right upper leg, Left upper leg, Right lower leg, Left lower leg, Face         Bathing assist Assist Level: Minimal Assistance - Patient > 75%     Upper Body Dressing/Undressing Upper body dressing   What is the patient wearing?: Pull over shirt (Night gown.)    Upper body assist Assist Level: Minimal Assistance - Patient > 75%    Lower Body Dressing/Undressing Lower body dressing      What is the patient wearing?: Pants, Incontinence brief     Lower body assist Assist for lower body dressing: Maximal Assistance - Patient 25 - 49%     Toileting Toileting    Toileting assist Assist for toileting: Moderate Assistance - Patient 50 - 74%  Transfers Chair/bed transfer  Transfers assist     Chair/bed transfer assist level: Contact Guard/Touching assist Chair/bed transfer assistive device: Programmer, multimedia   Ambulation assist      Assist level: Contact Guard/Touching assist Assistive device: Walker-rolling Max distance: 37ft   Walk 10 feet activity   Assist     Assist level: Contact Guard/Touching assist Assistive device: Walker-rolling   Walk 50 feet activity   Assist Walk 50 feet with 2 turns activity did not occur: Safety/medical concerns  Assist level: Contact Guard/Touching assist Assistive device: Walker-rolling    Walk 150 feet activity   Assist Walk 150 feet activity did not occur: Safety/medical concerns         Walk 10 feet on uneven  surface  activity   Assist Walk 10 feet on uneven surfaces activity did not occur: Safety/medical concerns         Wheelchair     Assist Will patient use wheelchair at discharge?: No   Wheelchair activity did not occur: Safety/medical concerns         Wheelchair 50 feet with 2 turns activity    Assist    Wheelchair 50 feet with 2 turns activity did not occur: Safety/medical concerns       Wheelchair 150 feet activity     Assist  Wheelchair 150 feet activity did not occur: Safety/medical concerns       Blood pressure 128/60, pulse 70, temperature 97.7 F (36.5 C), resp. rate 18, height 5\' 6"  (1.676 m), weight 89.7 kg, SpO2 97 %.  Medical Problem List and Plan: 1.  Altered mental status secondary to small left caudate head ICH with small IVH secondary hypertension and anticoagulation use             -patient may shower with thigh wounds covered              -ELOS/Goals: 14-16 days, min assist to supervision goals with PT, OT and mod I to supervision with SLP 2.  Antithrombotics: -DVT/anticoagulation: SCDs             -antiplatelet therapy: per Dr Leonie Man ok to start ASA 81mg  today  3. Pain Management: Tylenol as needed 4. Mood: Provide emotional support             -antipsychotic agents: N/A 5. Neuropsych: This patient is not capable of making decisions on her own behalf. 7. Fluids/Electrolytes/Nutrition: Routine in and outs with follow-up chemistries 8.  Hypertension.  Lisinopril 10 mg daily.  Monitor with increased mobility 9.  Atrial fibrillation/pacemaker.  Amiodarone 200 mg twice daily.   Eliquis discontinued due to [place]- per neuro ok for ASA 81mg  daily  10.  Hypothyroidism.  Synthroid 11.  Chronic left thigh wound.  Followed by Dr. Lucia Gaskins.  WOC following while in the hospital.  Dressing changes as directed.   -Wound VAC discontinued 03/21/2021 12.  NHL with SVC syndrome/lung cancer radiation therapy as well as chemo.  Followed by Dr.Gudena 13.   Obesity.  Status post lap band surgery.  BMI 32.10.  Dietary follow-up 14.  Acute on chronic anemia.  Follow-up CBC Hbg improved to 10.3    LOS: 3 days A FACE TO FACE EVALUATION WAS PERFORMED  Charlett Blake 03/22/2021, 8:45 AM

## 2021-03-23 MED ORDER — FLEET ENEMA 7-19 GM/118ML RE ENEM
1.0000 | ENEMA | Freq: Once | RECTAL | Status: AC
  Administered 2021-03-23: 1 via RECTAL
  Filled 2021-03-23: qty 1

## 2021-03-23 NOTE — Progress Notes (Signed)
Physical Therapy Session Note  Patient Details  Name: Kayla Harrison MRN: [id number] Date of Birth: 1946/06/01  Today's Date: 03/23/2021 PT Individual Time: 0915-1025 PT Individual Time Calculation (min): 70 min   Short Term Goals: Week 1:  PT Short Term Goal 1 (Week 1): Patient will perform bed mobility with S PT Short Term Goal 2 (Week 1): Patient will transfer sit to stand with CGA. PT Short Term Goal 3 (Week 1): Patient will ambulate x 100' with min A. PT Short Term Goal 4 (Week 1): Patient negotiate at least 1 step with min A.  Skilled Therapeutic Interventions/Progress Updates:   Received pt semi-reclined in bed, pt agreeable to PT treatment, and denied any pain at rest but reported increased R knee pain to 5/10 with mobility. RN notified and present to administer pain medication during session. Session with emphasis on dressing, functional mobility/transfers, generalized strengthening, dynamic standing balance/coordination, gait training, and improved activity tolerance. Noted pt's L hip dressing weeping; RN notified and present to inspect wound/dressing. Donned pants in supine with max A and pt transferred semi-reclined<>sitting EOB with HOB elevated and use of bedrails with significantly increased time and supervision and doffed nightgown and donned bra and shirt with supervision. Pt reported urge to urinate and transferred bed<>bedside commode with RW and CGA/min A due to urgency. Pt able to void and performed peri-care standing with CGA; total A for clothing management in standing. Pt ambulated 57ft to WC with RW and CGA but with increased L knee flexion almost buckling due to LE fatigue resulting in poor eccentric descent. Pt sat in [facility] and brushed teeth with supervision and min cues for visual scanning to locate toothpaste. Pt transported to dayroom in [facility] total A for time management purposes and ambulated 45ft x 1, 4ft x 1, and 87ft with RW CGA. Pt reported mild dizziness with gait (see  details below) that resolved with 2 minute seated rest break. Educated pt on energy conservation and importance of recognizing onset of dizziness. Trial 1: 107/62 Trial 2: 117/60 Pt performed the following exercises standing with BUE support on RW and CGA for balance: -Marching x10 bilaterally -Heel raises x15  Pt transported back to room in [facility] total A. Concluded session with pt sitting in WC, needs within reach, and seatbelt alarm on.   Therapy Documentation Precautions:  Precautions Precautions: Fall Precaution Comments: L hip wound vac, watch for orthostatic hypotension, contact precautions Restrictions Weight Bearing Restrictions: No LUE Weight Bearing: Touch down weight bearing LLE Weight Bearing: Weight bearing as tolerated  Therapy/Group: Individual Therapy Alfonse Alpers PT, DPT   03/23/2021, 7:16 AM

## 2021-03-23 NOTE — Plan of Care (Signed)
  Problem: Consults Goal: RH STROKE PATIENT EDUCATION Description: See Patient Education module for education specifics  Outcome: Progressing   Problem: RH BOWEL ELIMINATION Goal: RH STG MANAGE BOWEL WITH ASSISTANCE Description: STG Manage Bowel with  mod I  Assistance. Outcome: Progressing Goal: RH STG MANAGE BOWEL W/MEDICATION W/ASSISTANCE Description: STG Manage Bowel with Medication with mod I Assistance. Outcome: Progressing   Problem: RH BLADDER ELIMINATION Goal: RH STG MANAGE BLADDER WITH ASSISTANCE Description: STG Manage Bladder Without Assistance Outcome: Progressing   Problem: RH SKIN INTEGRITY Goal: RH STG MAINTAIN SKIN INTEGRITY WITH ASSISTANCE Description: STG Maintain Skin Integrity With  min Assistance. Outcome: Progressing Goal: RH STG ABLE TO PERFORM INCISION/WOUND CARE W/ASSISTANCE Description: STG Able To Perform Incision/Wound Care With min Assistance. Outcome: Progressing   Problem: RH SAFETY Goal: RH STG ADHERE TO SAFETY PRECAUTIONS W/ASSISTANCE/DEVICE Description: STG Adhere to Safety Precautions With  cues/reminders assistance/Device. Outcome: Progressing   Problem: RH PAIN MANAGEMENT Goal: RH STG PAIN MANAGED AT OR BELOW PT'S PAIN GOAL Description: At or below level 4 Outcome: Progressing   Problem: RH KNOWLEDGE DEFICIT Goal: RH STG INCREASE KNOWLEDGE OF HYPERTENSION Description: Patient will be able to manage HTN with medications and dietary modifications using handouts and educational tools independently Outcome: Progressing Goal: RH STG INCREASE KNOWLEGDE OF HYPERLIPIDEMIA Description: Patient will be able to manage HLD with medications and dietary modifications using handouts and educational tools independently Outcome: Progressing

## 2021-03-23 NOTE — Progress Notes (Signed)
PROGRESS NOTE   Subjective/Complaints:  Constipation , no abd pain Last BM 6/27  ROS- neg CP, SOB, N/V/D  Objective:   No results found. No results for input(s): WBC, HGB, HCT, PLT in the last 72 hours.  No results for input(s): NA, K, CL, CO2, GLUCOSE, BUN, CREATININE, CALCIUM in the last 72 hours.   Intake/Output Summary (Last 24 hours) at 03/23/2021 0852 Last data filed at 03/23/2021 0800 Gross per 24 hour  Intake 720 ml  Output --  Net 720 ml      Pressure Injury 03/15/21 Thigh Left;Medial Stage 2 -  Partial thickness loss of dermis presenting as a shallow open injury with a red, pink wound bed without slough. (Active)  03/15/21 0017  Location: Thigh  Location Orientation: Left;Medial  Staging: Stage 2 -  Partial thickness loss of dermis presenting as a shallow open injury with a red, pink wound bed without slough.  Wound Description (Comments):   Present on Admission: Yes    Physical Exam: Vital Signs Blood pressure 125/64, pulse 70, temperature 97.8 F (36.6 C), temperature source Oral, resp. rate 18, height 5\' 6"  (1.676 m), weight 89.6 kg, SpO2 96 %.     General: No acute distress Mood and affect are appropriate Heart: Regular rate and rhythm no rubs murmurs or extra sounds Lungs: Clear to auscultation, breathing unlabored, no rales or wheezes Abdomen: Positive bowel sounds, soft nontender to palpation, nondistended Extremities: No clubbing, cyanosis, or edema  Skin: Left post thigh wound vac site with dressing (vac removed)  Neurologic: Cranial nerves II through XII intact, motor strength is 4/5 in bilateral deltoid, bicep, tricep, grip, 3-/5 Bilateral hip flexor, knee extensors, ankle dorsiflexor and plantar flexor Sensory exam normal sensation to light touch in bilateral upper and lower extremities Cerebellar- no dysmetria bilateral FNF Musculoskeletal: tight heel cords bilateral      Assessment/Plan: 1. Functional deficits which require 3+ hours per day of interdisciplinary therapy in a comprehensive inpatient rehab setting. Physiatrist is providing close team supervision and 24 hour management of active medical problems listed below. Physiatrist and rehab team continue to assess barriers to discharge/monitor patient progress toward functional and medical goals  Care Tool:  Bathing    Body parts bathed by patient: Right arm, Left arm, Chest, Abdomen, Front perineal area, Buttocks, Right upper leg, Left upper leg, Right lower leg, Left lower leg, Face         Bathing assist Assist Level: Minimal Assistance - Patient > 75%     Upper Body Dressing/Undressing Upper body dressing   What is the patient wearing?: Pull over shirt    Upper body assist Assist Level: Minimal Assistance - Patient > 75%    Lower Body Dressing/Undressing Lower body dressing      What is the patient wearing?: Pants, Incontinence brief     Lower body assist Assist for lower body dressing: Maximal Assistance - Patient 25 - 49%     Toileting Toileting    Toileting assist Assist for toileting: Moderate Assistance - Patient 50 - 74%     Transfers Chair/bed transfer  Transfers assist     Chair/bed transfer assist level: Contact Guard/Touching assist Chair/bed transfer  assistive device: Museum/gallery exhibitions officer assist      Assist level: Contact Guard/Touching assist Assistive device: Walker-rolling Max distance: 70ft   Walk 10 feet activity   Assist     Assist level: Contact Guard/Touching assist Assistive device: Walker-rolling   Walk 50 feet activity   Assist Walk 50 feet with 2 turns activity did not occur: Safety/medical concerns  Assist level: Contact Guard/Touching assist Assistive device: Walker-rolling    Walk 150 feet activity   Assist Walk 150 feet activity did not occur: Safety/medical concerns         Walk 10  feet on uneven surface  activity   Assist Walk 10 feet on uneven surfaces activity did not occur: Safety/medical concerns         Wheelchair     Assist Will patient use wheelchair at discharge?: Yes (Per PT long term goals)   Wheelchair activity did not occur: Safety/medical concerns         Wheelchair 50 feet with 2 turns activity    Assist    Wheelchair 50 feet with 2 turns activity did not occur: Safety/medical concerns       Wheelchair 150 feet activity     Assist  Wheelchair 150 feet activity did not occur: Safety/medical concerns       Blood pressure 125/64, pulse 70, temperature 97.8 F (36.6 C), temperature source Oral, resp. rate 18, height 5\' 6"  (1.676 m), weight 89.6 kg, SpO2 96 %.  Medical Problem List and Plan: 1.  Altered mental status secondary to small left caudate head ICH with small IVH secondary hypertension and anticoagulation use             -patient may shower with thigh wounds covered              -ELOS/Goals: 14-16 days, min assist to supervision goals with PT, OT and mod I to supervision with SLP 2.  Antithrombotics: -DVT/anticoagulation: SCDs             -antiplatelet therapy: per Dr Leonie Man ok to start ASA 81mg  today  3. Pain Management: Tylenol as needed 4. Mood: Provide emotional support             -antipsychotic agents: N/A 5. Neuropsych: This patient is not capable of making decisions on her own behalf. 7. Fluids/Electrolytes/Nutrition: Routine in and outs with follow-up chemistries 8.  Hypertension.  Lisinopril 10 mg daily.  Monitor with increased mobility 9.  Atrial fibrillation/pacemaker.  Amiodarone 200 mg twice daily.   Eliquis discontinued due to [place]- per neuro ok for ASA 81mg  daily  10.  Hypothyroidism.  Synthroid 11.  Chronic left thigh wound.  Followed by Dr. Lucia Gaskins.  WOC following while in the hospital.  Dressing changes as directed.   -Wound VAC discontinued 03/21/2021 12.  NHL with SVC syndrome/lung cancer  radiation therapy as well as chemo.  Followed by Dr.Gudena 13.  Obesity.  Status post lap band surgery.  BMI 32.10.  Dietary follow-up 14.  Acute on chronic anemia.  Follow-up CBC Hbg improved to 10.3  15- constipation on Senna 2 po qd, will increase to BID, give fleets today   LOS: 4 days A FACE TO Moran E Alpha Mysliwiec 03/23/2021, 8:52 AM

## 2021-03-23 NOTE — Progress Notes (Signed)
Occupational Therapy Session Note  Patient Details  Name: Kayla Harrison MRN: [id number] Date of Birth: [birth year]  Today's Date: 03/23/2021 OT Group Time: 1100-1200 OT Group Time Calculation (min): 60 min   Short Term Goals: Week 1:  OT Short Term Goal 1 (Week 1): Patient will complete LB dressing mod A using AE PRN. OT Short Term Goal 2 (Week 1): Patient will complete toilet transfer min A with LRAD. OT Short Term Goal 3 (Week 1): Patient will complete toileting mod A with LRAD. OT Short Term Goal 4 (Week 1): Patient will complete standing ADL using LRAD and min A for balance/safety. OT Short Term Goal 5 (Week 1): Patient will complete ADL task with min vc's for problem-solving.  Skilled Therapeutic Interventions/Progress Updates:    Pt participated in rhythmic drumming group. Pain not reported during session Focus of group on BUE coordination, strengthening, endurance, timing/control, activity tolerance, and social participation and engagement. Pt performs session from seated position for energy conservation. Skilled interventions included grading movements up through larger ROM to improve BUE strengthening and sitting away from back of chair for unsupported sitting balance. Warm up performed prior to exercises and UB stretching completed at end of group with demo from OT. Pt able to select preferred song to share with group. Returned pt to room at end of session. Exited session with pt seated in w/c, exit alarm on and call light in reach   Therapy Documentation Precautions:  Precautions Precautions: Fall Precaution Comments: L hip wound vac, watch for orthostatic hypotension, contact precautions Restrictions Weight Bearing Restrictions: No LUE Weight Bearing: Touch down weight bearing LLE Weight Bearing: Weight bearing as tolerated General:   Vital Signs: Therapy Vitals Temp: 97.8 F (36.6 C) Temp Source: Oral Pulse Rate: 70 Resp: 18 BP: 125/64 Patient Position (if  appropriate): Lying Oxygen Therapy SpO2: 96 % O2 Device: Room Air Pain:   ADL: ADL Eating: Set up Where Assessed-Eating: Bed level Grooming: Setup Where Assessed-Grooming: Bed level Upper Body Bathing: Setup Where Assessed-Upper Body Bathing: Sitting at sink Lower Body Bathing: Moderate assistance Where Assessed-Lower Body Bathing: Standing at sink, Sitting at sink Upper Body Dressing: Minimal assistance Where Assessed-Upper Body Dressing: Sitting at sink Lower Body Dressing: Maximal assistance Where Assessed-Lower Body Dressing: Bed level Toileting: Maximal assistance Where Assessed-Toileting: Bedside Commode Toilet Transfer: Moderate assistance, Moderate verbal cueing Toilet Transfer Method: Stand pivot Toilet Transfer Equipment: Extra wide drop arm bedside commode Tub/Shower Transfer: Not assessed Social research officer, government: Not assessed (Not assessed due to wound vac on LLE) Vision   Perception    Praxis   Exercises:   Other Treatments:     Therapy/Group: Group Therapy  Tonny Branch 03/23/2021, 6:59 AM

## 2021-03-23 NOTE — Progress Notes (Signed)
Speech Language Pathology Daily Session Note  Patient Details  Name: Kayla Harrison MRN: [id number] Date of Birth: 1946-05-12  Today's Date: 03/23/2021 SLP Individual Time: 1505-1540 SLP Individual Time Calculation (min): 35 min  Short Term Goals: Week 1: SLP Short Term Goal 1 (Week 1): Patient will demonstrate functional problem solving for mildly complex tasks with Mod verbal cues. SLP Short Term Goal 2 (Week 1): Patient will recall new, daily information with Mod A multimodal cues for use of compnesatory strategies. SLP Short Term Goal 3 (Week 1): Patient will demosntrate selective attention to tasks in a mildly distracting enviornment for 30 minutes with Min verbal cues for redirection. SLP Short Term Goal 4 (Week 1): Patient will self-monitor and correct errors during functional tasks with Mod A verbal cues.  Skilled Therapeutic Interventions: Patient seen for skilled ST session focusing on cognitive function goals. Patient missed some time at beginning of session due to SLP not being able to adequately wake her from sleep. Patient's phone (which was beside her in bed) rang and this woke her up but appears to have startled and confused her. She did not immediately recognize callers voice and tried handing phone to SLP. Patient then remained awake. She remained confused about "losing time", being more tired and sleepy during the day but also not having any difficulty falling asleep at night. She perseverated on talking about wondering what was causing her sleepiness. Patient did not demonstrate recognizing SLP who had seen her previous date. She suddenly took bed covers off and said "I need to use the bathroom" but while SLP checking safety plan regarding her ability to transfer from bed she then reported, "I'm going". SLP notified patient's NT as per patient request. Patient continues to benefit from skilled SLP intervention to maximize cognitive function prior to discharge.  Pain Pain  Assessment Pain Scale: 0-10 Pain Score: 0-No pain  Therapy/Group: Individual Therapy  Sonia Baller, MA, CCC-SLP Speech Therapy

## 2021-03-24 NOTE — Plan of Care (Signed)
  Problem: Consults Goal: RH STROKE PATIENT EDUCATION Description: See Patient Education module for education specifics  Outcome: Progressing   Problem: RH BOWEL ELIMINATION Goal: RH STG MANAGE BOWEL WITH ASSISTANCE Description: STG Manage Bowel with  mod I  Assistance. Outcome: Progressing Goal: RH STG MANAGE BOWEL W/MEDICATION W/ASSISTANCE Description: STG Manage Bowel with Medication with mod I Assistance. Outcome: Progressing   Problem: RH BLADDER ELIMINATION Goal: RH STG MANAGE BLADDER WITH ASSISTANCE Description: STG Manage Bladder Without Assistance Outcome: Progressing   Problem: RH SKIN INTEGRITY Goal: RH STG MAINTAIN SKIN INTEGRITY WITH ASSISTANCE Description: STG Maintain Skin Integrity With  min Assistance. Outcome: Progressing Goal: RH STG ABLE TO PERFORM INCISION/WOUND CARE W/ASSISTANCE Description: STG Able To Perform Incision/Wound Care With min Assistance. Outcome: Progressing   Problem: RH SAFETY Goal: RH STG ADHERE TO SAFETY PRECAUTIONS W/ASSISTANCE/DEVICE Description: STG Adhere to Safety Precautions With  cues/reminders assistance/Device. Outcome: Progressing   Problem: RH PAIN MANAGEMENT Goal: RH STG PAIN MANAGED AT OR BELOW PT'S PAIN GOAL Description: At or below level 4 Outcome: Progressing   Problem: RH KNOWLEDGE DEFICIT Goal: RH STG INCREASE KNOWLEDGE OF HYPERTENSION Description: Patient will be able to manage HTN with medications and dietary modifications using handouts and educational tools independently Outcome: Progressing Goal: RH STG INCREASE KNOWLEGDE OF HYPERLIPIDEMIA Description: Patient will be able to manage HLD with medications and dietary modifications using handouts and educational tools independently Outcome: Progressing

## 2021-03-24 NOTE — Progress Notes (Signed)
Occupational Therapy Session Note  Patient Details  Name: Kayla Harrison MRN: [id number] Date of Birth: June 12, 1946  Today's Date: 03/24/2021 OT Individual Time: 1000-1054 OT Individual Time Calculation (min): 54 min    Short Term Goals: Week 1:  OT Short Term Goal 1 (Week 1): Patient will complete LB dressing mod A using AE PRN. OT Short Term Goal 2 (Week 1): Patient will complete toilet transfer min A with LRAD. OT Short Term Goal 3 (Week 1): Patient will complete toileting mod A with LRAD. OT Short Term Goal 4 (Week 1): Patient will complete standing ADL using LRAD and min A for balance/safety. OT Short Term Goal 5 (Week 1): Patient will complete ADL task with min vc's for problem-solving.  Skilled Therapeutic Interventions/Progress Updates:    Pt greeted while lying in bed, holding her therapy schedule and stating that she didn't think anyone was coming today to see her. When we looked at her schedule together, therapist pointed out her printed OT appointment. Per pt, "Oh, I just didn't see it." She was also disconnected from the wound vac. Per pt, she doesn't remember when this happened and appeared afraid when OT mentioned this to her. Per RN, ok to replace saturated foam dressing over the Lt hip (where vac was disconnected from). Notified RN that pt needed to have a few other LE dressing changed due to saturated appearance. Pt was agreeable to engage in bathing/dressing tasks sit<stand from EOB using RW for standing support as needed. She required vcs for short term memory recall during stated tasks, needed assistance to maintain and obtain figure 4 position with the Rt LE for washing foot and donning gripper sock. Assistance for maintaining figure 4 only for the [place]. No reacher in supply, Max A for threading pants over feet and pt able to pull up fabric from midcalf. Mod A to fully elevate pants in standing, CGA for sit<stand and for dynamic balance. Min A for balance at times when she  said "oh" and exhibited small LOBs. Min A for donning overhead shirt. Setup for completing oral care. Supervision for lateral scooting up towards Erma where pt transitioned to supine position. Offered to set her up in the recliner but she declined. Pt remained in bed at close of session, all needs within reach, bed alarm set, and visitors present to see her.  Pt disoriented to medical situation during session, stated she felt "confused" and wasn't sure "what was happening." OT educated pt about her [place] though pt asked about her situation again later during session  Therapy Documentation Precautions:  Precautions Precautions: Fall Precaution Comments: L hip wound vac, watch for orthostatic hypotension, contact precautions Restrictions Weight Bearing Restrictions: No LUE Weight Bearing: Touch down weight bearing LLE Weight Bearing: Weight bearing as tolerated  Pain: in Lt hip, pt reported pain to be manageable during session without additional interventions   ADL: ADL Eating: Set up Where Assessed-Eating: Bed level Grooming: Setup Where Assessed-Grooming: Bed level Upper Body Bathing: Setup Where Assessed-Upper Body Bathing: Sitting at sink Lower Body Bathing: Moderate assistance Where Assessed-Lower Body Bathing: Standing at sink, Sitting at sink Upper Body Dressing: Minimal assistance Where Assessed-Upper Body Dressing: Sitting at sink Lower Body Dressing: Maximal assistance Where Assessed-Lower Body Dressing: Bed level Toileting: Maximal assistance Where Assessed-Toileting: Bedside Commode Toilet Transfer: Moderate assistance, Moderate verbal cueing Toilet Transfer Method: Stand pivot Toilet Transfer Equipment: Extra wide drop arm bedside commode Tub/Shower Transfer: Not assessed Gaffer Transfer: Not assessed (Not assessed due to wound  vac on LLE)     Therapy/Group: Individual Therapy  Kayla Harrison A Kayla Harrison 03/24/2021, 12:49 PM

## 2021-03-24 NOTE — Progress Notes (Signed)
PROGRESS NOTE   Subjective/Complaints:  Asked how I thought she was doing. Didn't recognize me from her previous admissions. Says that her left thigh isn't too painful anymore.  ROS: Patient denies fever, rash, sore throat, blurred vision, nausea, vomiting, diarrhea, cough, shortness of breath or chest pain,  headache, or mood change.   Objective:   No results found. No results for input(s): WBC, HGB, HCT, PLT in the last 72 hours.  No results for input(s): NA, K, CL, CO2, GLUCOSE, BUN, CREATININE, CALCIUM in the last 72 hours.   Intake/Output Summary (Last 24 hours) at 03/24/2021 1441 Last data filed at 03/24/2021 1425 Gross per 24 hour  Intake 600 ml  Output --  Net 600 ml     Pressure Injury 03/15/21 Thigh Left;Medial Stage 2 -  Partial thickness loss of dermis presenting as a shallow open injury with a red, pink wound bed without slough. (Active)  03/15/21 0017  Location: Thigh  Location Orientation: Left;Medial  Staging: Stage 2 -  Partial thickness loss of dermis presenting as a shallow open injury with a red, pink wound bed without slough.  Wound Description (Comments):   Present on Admission: Yes    Physical Exam: Vital Signs Blood pressure (!) 110/47, pulse 69, temperature 97.8 F (36.6 C), temperature source Oral, resp. rate 17, height 5\' 6"  (1.676 m), weight 88.8 kg, SpO2 100 %.     Constitutional: No distress . Vital signs reviewed. HEENT: EOMI, oral membranes moist Neck: supple Cardiovascular: RRR without murmur. No JVD    Respiratory/Chest: CTA Bilaterally without wheezes or rales. Normal effort    GI/Abdomen: BS +, non-tender, non-distended Ext: no clubbing, cyanosis, or edema Psych: pleasant and cooperative, sl confused Skin: Left post thigh wound with dressing, sl s/s drainage Neurologic: oriented to self. STM deficits. Follows basic commands. Decreased insight and awareness. Cranial nerves II  through XII intact, motor strength is 4/5 in bilateral deltoid, bicep, tricep, grip, 3-/5 Bilateral hip flexor, knee extensors, ankle dorsiflexor and plantar flexor Sensory exam normal sensation to light touch in bilateral upper and lower extremities Cerebellar- no dysmetria bilateral FNF Musculoskeletal: tight heel cords bilateral     Assessment/Plan: 1. Functional deficits which require 3+ hours per day of interdisciplinary therapy in a comprehensive inpatient rehab setting. Physiatrist is providing close team supervision and 24 hour management of active medical problems listed below. Physiatrist and rehab team continue to assess barriers to discharge/monitor patient progress toward functional and medical goals  Care Tool:  Bathing    Body parts bathed by patient: Right arm, Left arm, Chest, Abdomen, Front perineal area, Buttocks, Right upper leg, Left upper leg, Right lower leg, Left lower leg, Face         Bathing assist Assist Level: Minimal Assistance - Patient > 75%     Upper Body Dressing/Undressing Upper body dressing   What is the patient wearing?: Pull over shirt    Upper body assist Assist Level: Minimal Assistance - Patient > 75%    Lower Body Dressing/Undressing Lower body dressing      What is the patient wearing?: Pants, Incontinence brief     Lower body assist Assist for lower body dressing:  Maximal Assistance - Patient 25 - 49%     Toileting Toileting    Toileting assist Assist for toileting: Moderate Assistance - Patient 50 - 74%     Transfers Chair/bed transfer  Transfers assist     Chair/bed transfer assist level: Contact Guard/Touching assist Chair/bed transfer assistive device: Programmer, multimedia   Ambulation assist      Assist level: Contact Guard/Touching assist Assistive device: Walker-rolling Max distance: 23ft   Walk 10 feet activity   Assist     Assist level: Contact Guard/Touching assist Assistive  device: Walker-rolling   Walk 50 feet activity   Assist Walk 50 feet with 2 turns activity did not occur: Safety/medical concerns  Assist level: Contact Guard/Touching assist Assistive device: Walker-rolling    Walk 150 feet activity   Assist Walk 150 feet activity did not occur: Safety/medical concerns         Walk 10 feet on uneven surface  activity   Assist Walk 10 feet on uneven surfaces activity did not occur: Safety/medical concerns         Wheelchair     Assist Will patient use wheelchair at discharge?: Yes (Per PT long term goals)   Wheelchair activity did not occur: Safety/medical concerns         Wheelchair 50 feet with 2 turns activity    Assist    Wheelchair 50 feet with 2 turns activity did not occur: Safety/medical concerns       Wheelchair 150 feet activity     Assist  Wheelchair 150 feet activity did not occur: Safety/medical concerns       Blood pressure (!) 110/47, pulse 69, temperature 97.8 F (36.6 C), temperature source Oral, resp. rate 17, height 5\' 6"  (1.676 m), weight 88.8 kg, SpO2 100 %.  Medical Problem List and Plan: 1.  Altered mental status secondary to small left caudate head ICH with small IVH secondary hypertension and anticoagulation use             -patient may shower with thigh wounds covered              -ELOS/Goals: 14-16 days, min assist to supervision goals with PT, OT and mod I to supervision with SLP 2.  Antithrombotics: -DVT/anticoagulation: SCDs             -antiplatelet therapy:  ASA 81mg  daily cleared by neuro 3. Pain Management: Tylenol as needed 4. Mood: Provide emotional support             -antipsychotic agents: N/A 5. Neuropsych: This patient is not capable of making decisions on her own behalf. 7. Fluids/Electrolytes/Nutrition: Routine in and outs with follow-up chemistries 8.  Hypertension.  Lisinopril 10 mg daily.  Monitor with increased mobility 9.  Atrial fibrillation/pacemaker.   Amiodarone 200 mg twice daily.   Eliquis discontinued due to [place]- now only on ASA 81mg  daily  10.  Hypothyroidism.  Synthroid 11.  Chronic left thigh wound.  Followed by Dr. Lucia Gaskins.  WOC following while in the hospital.  Dressing changes as directed.   -Wound VAC discontinued 03/21/2021 12.  NHL with SVC syndrome/lung cancer radiation therapy as well as chemo.  Followed by Dr.Gudena 13.  Obesity.  Status post lap band surgery.  BMI 32.10.  Dietary follow-up 14.  Acute on chronic anemia.  Follow-up CBC Hbg improved to 10.3  15- constipation on Senna 2 po bid. Received fleets  -has had 4 bm's in last 24 hours 7/2  LOS: 5 days  A FACE TO FACE EVALUATION WAS PERFORMED  Meredith Staggers 03/24/2021, 2:41 PM

## 2021-03-25 NOTE — Progress Notes (Signed)
Physical Therapy Session Note  Patient Details  Name: Kayla Harrison MRN: [id number] Date of Birth: 1945-11-29  Today's Date: 03/25/2021 PT Individual Time: 1010-1110 PT Individual Time Calculation (min): 60 min   Short Term Goals: Week 1:  PT Short Term Goal 1 (Week 1): Patient will perform bed mobility with S PT Short Term Goal 2 (Week 1): Patient will transfer sit to stand with CGA. PT Short Term Goal 3 (Week 1): Patient will ambulate x 100' with min A. PT Short Term Goal 4 (Week 1): Patient negotiate at least 1 step with min A.  Skilled Therapeutic Interventions/Progress Updates: Pt presents supine in bed and agreeable to therapy.  Pt transferred sup to sit w/ slightly elevated HOB w/ CGA.  Pt transferred sit to stand from bed w/ min A and verbal cues for hand placement.  Pt transferred bed > w/c w/ RW and min A w/ 1 episode of retropulsion, still min A to regain. Pt wheeled to main gym for energy conservation.  Pt amb multiple trials w/ RW and min A up to 50' w/ verbal cues for posture and maintaining position in RW especially during turns.  Pt required verbal cues and min A for sit to stand transfers, especially as fatigues for forward lean.  Pt amb through changed obstacle courses of 5 cones w/ difficulty w/ turns as pt states using rollator PTA.  Pt states "knees burning" so performed sitting reaching outside of BOS to stack/unstack cones crossing midline and using BUEs simultaneously.  Pt performed standing reaching for hooking horseshoes over high hoop using B UEs reaching outside of BOS w/o LOB, standing w/o UE support and head turns.  Pt requires seated rest breaks between trials.  Pt amb x 15' from hallway to bed w/ min A and RW, including turn for safe approach to bed.  Pt transfers sit to supine w/ supervision.  Bed alarm on and all needs in reach.  Therapy Documentation Precautions:  Precautions Precautions: Fall Precaution Comments: L hip wound vac, watch for orthostatic  hypotension, contact precautions Restrictions Weight Bearing Restrictions: No LUE Weight Bearing: Touch down weight bearing LLE Weight Bearing: Weight bearing as tolerated General:   Vital Signs:   Pain: initially, 0/10, then w/ activity, states knees burning.      Therapy/Group: Individual Therapy  Ladoris Gene 03/25/2021, 11:27 AM

## 2021-03-25 NOTE — Progress Notes (Signed)
Occupational Therapy Session Note  Patient Details  Name: Kayla Harrison MRN: [id number] Date of Birth: 1946/01/16  Today's Date: 03/25/2021 OT Individual Time: [phone] and [id number] OT Individual Time Calculation (min): 62 min and 59 min   Short Term Goals: Week 1:  OT Short Term Goal 1 (Week 1): Patient will complete LB dressing mod A using AE PRN. OT Short Term Goal 2 (Week 1): Patient will complete toilet transfer min A with LRAD. OT Short Term Goal 3 (Week 1): Patient will complete toileting mod A with LRAD. OT Short Term Goal 4 (Week 1): Patient will complete standing ADL using LRAD and min A for balance/safety. OT Short Term Goal 5 (Week 1): Patient will complete ADL task with min vc's for problem-solving.  Skilled Therapeutic Interventions/Progress Updates:    Session 1: Pt received supine, agreeable to OT, reporting no pain. Pt reported confusion about length of stay and diagnosis, requesting info/education provided with improved understanding; needs reinforcement.  Pt expressed urination urgency. Sup>sit close spvsn, sit<>stand with RW CGA throughout session, stand step transfers bed<>BSC<>w/c CGA with RW. Incontinent and continent of bladder requiring mod A for clothing management but close spvsn for perihygiene. Completed UB bathing/dressing and LB bathing seated EOB set up A; LB dressing seated and in stance with RW mod A to pull over hips. Note thigh high TED hose not donned due to LB bathing ADL task practice. Following stance for dressing, pt reporting urgency for BM; pt incontinent of bowels. Completed wiping buttocks seated and in stance; extra time for thoroughness. Oral hygiene completed in w/c at sink set up A. During multiple standing trials, pt reported slight lightheadedness that would improve upon seated rest breaks, see vitals below. Standing vitals unable to be taken due to incontinence of bowels/urgency. Pt remained supine in bed, alarm set, all needs met.  Session  2: Pt received supine, agreeable to OT, reporting no pain. Donned thigh high ted hose dependently supine. Sup>sit close spvsn, sit<>stand CGA throughout session, stand step transfers bed<>w/c<>therapy mat CGA with RW. Pt engaged in BUE conditioning and dynamic sitting & standing activities reaching outside BOS with RW and intermittently with BUEs unsupported; one slight LOB noted but self corrected. Tasks required pt reaching high, low, anteriorly, posteriorly, and tossing bean bags to targets. Pt demoed quick and easy ability to complete simple math problems and problem solving/executive functioning skills while playing connect 4. Pt demoed standing tolerance for 4 trials ~2 min each. Seated rest breaks required in between trials with pt reporting fatigue and "knees feeling like they are going to give out"; no light headedness reported. Mod vc's for hand placement during sit<>stands throughout session with poor carryover. Sister joined session near end and pt returned to room, requesting to use bathroom. Stand pivot transfer w/c<>standard height toilet with HHA and no RW (used grab bars). Completed clothing management CGA doffing but mod A donning; perihygiene in stance CGA. Washed hands seated at sink set up A. Remained in w/c, alarm set, sister present, all needs met.  Therapy Documentation Precautions:  Precautions Precautions: Fall Precaution Comments: watch for orthostatic hypotension, contact precautions Restrictions Weight Bearing Restrictions: No LUE Weight Bearing: Touch down weight bearing LLE Weight Bearing: Weight bearing as tolerated  Vital Signs:  Seated during session 1: 118/51, HR 69  Pain: Pain Assessment Pain Scale: 0-10 Pain Score: 0-No pain   Therapy/Group: Individual Therapy  Mellissa Kohut 03/25/2021, 8:56 AM

## 2021-03-25 NOTE — Plan of Care (Signed)
  Problem: Consults Goal: RH STROKE PATIENT EDUCATION Description: See Patient Education module for education specifics  Outcome: Progressing   Problem: RH BOWEL ELIMINATION Goal: RH STG MANAGE BOWEL WITH ASSISTANCE Description: STG Manage Bowel with  mod I  Assistance. Outcome: Progressing Goal: RH STG MANAGE BOWEL W/MEDICATION W/ASSISTANCE Description: STG Manage Bowel with Medication with mod I Assistance. Outcome: Progressing   Problem: RH BLADDER ELIMINATION Goal: RH STG MANAGE BLADDER WITH ASSISTANCE Description: STG Manage Bladder Without Assistance Outcome: Progressing   Problem: RH SKIN INTEGRITY Goal: RH STG MAINTAIN SKIN INTEGRITY WITH ASSISTANCE Description: STG Maintain Skin Integrity With  min Assistance. Outcome: Progressing Goal: RH STG ABLE TO PERFORM INCISION/WOUND CARE W/ASSISTANCE Description: STG Able To Perform Incision/Wound Care With min Assistance. Outcome: Progressing   Problem: RH SAFETY Goal: RH STG ADHERE TO SAFETY PRECAUTIONS W/ASSISTANCE/DEVICE Description: STG Adhere to Safety Precautions With  cues/reminders assistance/Device. Outcome: Progressing   Problem: RH PAIN MANAGEMENT Goal: RH STG PAIN MANAGED AT OR BELOW PT'S PAIN GOAL Description: At or below level 4 Outcome: Progressing   Problem: RH KNOWLEDGE DEFICIT Goal: RH STG INCREASE KNOWLEDGE OF HYPERTENSION Description: Patient will be able to manage HTN with medications and dietary modifications using handouts and educational tools independently Outcome: Progressing Goal: RH STG INCREASE KNOWLEGDE OF HYPERLIPIDEMIA Description: Patient will be able to manage HLD with medications and dietary modifications using handouts and educational tools independently Outcome: Progressing

## 2021-03-25 NOTE — Progress Notes (Signed)
PROGRESS NOTE   Subjective/Complaints:  No problems overnight. Denies pain.   ROS: Limited due to cognitive/behavioral   Objective:   No results found. No results for input(s): WBC, HGB, HCT, PLT in the last 72 hours.  No results for input(s): NA, K, CL, CO2, GLUCOSE, BUN, CREATININE, CALCIUM in the last 72 hours.   Intake/Output Summary (Last 24 hours) at 03/25/2021 1026 Last data filed at 03/25/2021 0900 Gross per 24 hour  Intake 460 ml  Output --  Net 460 ml     Pressure Injury 03/15/21 Thigh Left;Medial Stage 2 -  Partial thickness loss of dermis presenting as a shallow open injury with a red, pink wound bed without slough. (Active)  03/15/21 0017  Location: Thigh  Location Orientation: Left;Medial  Staging: Stage 2 -  Partial thickness loss of dermis presenting as a shallow open injury with a red, pink wound bed without slough.  Wound Description (Comments):   Present on Admission: Yes    Physical Exam: Vital Signs Blood pressure 137/66, pulse 65, temperature 98.3 F (36.8 C), temperature source Oral, resp. rate 18, height 5\' 6"  (1.676 m), weight 88.8 kg, SpO2 96 %.     Constitutional: No distress . Vital signs reviewed. HEENT: EOMI, oral membranes moist Neck: supple Cardiovascular: RRR without murmur. No JVD    Respiratory/Chest: CTA Bilaterally without wheezes or rales. Normal effort    GI/Abdomen: BS +, non-tender, non-distended Ext: no clubbing, cyanosis, or edema Psych: pleasant and cooperative, still sl confused Skin: Left post thigh wound with dressing, sl s/s drainage visible Neurologic: oriented to self. New STM deficits. Follows basic commands. Decreased insight and awareness. Cranial nerves II through XII intact, motor strength is 4/5 in bilateral deltoid, bicep, tricep, grip, 3-/5 Bilateral hip flexor, knee extensors, ankle dorsiflexor and plantar flexor Sensory exam normal sensation to light  touch in bilateral upper and lower extremities Cerebellar- no dysmetria bilateral FNF Musculoskeletal: tight heel cords bilateral --no change    Assessment/Plan: 1. Functional deficits which require 3+ hours per day of interdisciplinary therapy in a comprehensive inpatient rehab setting. Physiatrist is providing close team supervision and 24 hour management of active medical problems listed below. Physiatrist and rehab team continue to assess barriers to discharge/monitor patient progress toward functional and medical goals  Care Tool:  Bathing    Body parts bathed by patient: Right arm, Left arm, Chest, Abdomen, Front perineal area, Buttocks, Right upper leg, Left upper leg, Right lower leg, Left lower leg, Face         Bathing assist Assist Level: Minimal Assistance - Patient > 75%     Upper Body Dressing/Undressing Upper body dressing   What is the patient wearing?: Pull over shirt    Upper body assist Assist Level: Minimal Assistance - Patient > 75%    Lower Body Dressing/Undressing Lower body dressing      What is the patient wearing?: Pants, Incontinence brief     Lower body assist Assist for lower body dressing: Maximal Assistance - Patient 25 - 49%     Toileting Toileting    Toileting assist Assist for toileting: Moderate Assistance - Patient 50 - 74%     Transfers  Chair/bed transfer  Transfers assist     Chair/bed transfer assist level: Contact Guard/Touching assist Chair/bed transfer assistive device: Programmer, multimedia   Ambulation assist      Assist level: Contact Guard/Touching assist Assistive device: Walker-rolling Max distance: 51ft   Walk 10 feet activity   Assist     Assist level: Contact Guard/Touching assist Assistive device: Walker-rolling   Walk 50 feet activity   Assist Walk 50 feet with 2 turns activity did not occur: Safety/medical concerns  Assist level: Contact Guard/Touching assist Assistive  device: Walker-rolling    Walk 150 feet activity   Assist Walk 150 feet activity did not occur: Safety/medical concerns         Walk 10 feet on uneven surface  activity   Assist Walk 10 feet on uneven surfaces activity did not occur: Safety/medical concerns         Wheelchair     Assist Will patient use wheelchair at discharge?: Yes (Per PT long term goals)   Wheelchair activity did not occur: Safety/medical concerns         Wheelchair 50 feet with 2 turns activity    Assist    Wheelchair 50 feet with 2 turns activity did not occur: Safety/medical concerns       Wheelchair 150 feet activity     Assist  Wheelchair 150 feet activity did not occur: Safety/medical concerns       Blood pressure 137/66, pulse 65, temperature 98.3 F (36.8 C), temperature source Oral, resp. rate 18, height 5\' 6"  (1.676 m), weight 88.8 kg, SpO2 96 %.  Medical Problem List and Plan: 1.  Altered mental status secondary to small left caudate head ICH with small IVH secondary hypertension and anticoagulation use             -patient may shower with thigh wounds covered              -ELOS/Goals: 14-16 days, min assist to supervision goals with PT, OT and mod I to supervision with SLP  -Continue CIR therapies including PT, OT, and SLP  2.  Antithrombotics: -DVT/anticoagulation: SCDs             -antiplatelet therapy:  ASA 81mg  daily cleared by neuro 3. Pain Management: Tylenol as needed 4. Mood: Provide emotional support             -antipsychotic agents: N/A 5. Neuropsych: This patient is not capable of making decisions on her own behalf. 7. Fluids/Electrolytes/Nutrition: Routine in and outs with follow-up chemistries 8.  Hypertension.  Lisinopril 10 mg daily.  Monitor with increased mobility 9.  Atrial fibrillation/pacemaker.  Amiodarone 200 mg twice daily.   Eliquis discontinued due to [place]- now only on ASA 81mg  daily  10.  Hypothyroidism.  Synthroid 11.  Chronic left  thigh wound.  Followed by Dr. Lucia Gaskins.  WOC following while in the hospital.  Dressing changes daily and prn.   -Wound VAC discontinued 03/21/2021 12.  NHL with SVC syndrome/lung cancer radiation therapy as well as chemo.  Followed by Dr.Gudena 13.  Obesity.  Status post lap band surgery.  BMI 32.10.  Dietary follow-up 14.  Acute on chronic anemia.  Follow-up CBC Hbg improved to 10.3  15- constipation on Senna 2 po bid. Received fleets  -seems to be moving bowels regularly now  LOS: 6 days A FACE TO FACE EVALUATION WAS PERFORMED  Meredith Staggers 03/25/2021, 10:26 AM

## 2021-03-25 NOTE — Progress Notes (Signed)
Patient condition remains stable though the shift. Dressing changes to left inner and outer thigh done per provider orders. Denies pain. Safety maintained.

## 2021-03-26 LAB — BASIC METABOLIC PANEL
Anion gap: 6 (ref 5–15)
BUN: 17 mg/dL (ref 8–23)
CO2: 24 mmol/L (ref 22–32)
Calcium: 8.4 mg/dL — ABNORMAL LOW (ref 8.9–10.3)
Chloride: 103 mmol/L (ref 98–111)
Creatinine, Ser: 0.89 mg/dL (ref 0.44–1.00)
GFR, Estimated: >60 mL/min (ref >60)
Glucose, Bld: 107 mg/dL — ABNORMAL HIGH (ref 70–99)
Potassium: 4.8 mmol/L (ref 3.5–5.1)
Sodium: 133 mmol/L — ABNORMAL LOW (ref 135–145)

## 2021-03-26 NOTE — Progress Notes (Signed)
Occupational Therapy Session Note  Patient Details  Name: Kayla Harrison MRN: [id number] Date of Birth: [birth year]  Today's Date: 03/26/2021 OT Individual Time: 1400-1425 OT Individual Time Calculation (min): 25 min    Short Term Goals: Week 1:  OT Short Term Goal 1 (Week 1): Patient will complete LB dressing mod A using AE PRN. OT Short Term Goal 2 (Week 1): Patient will complete toilet transfer min A with LRAD. OT Short Term Goal 3 (Week 1): Patient will complete toileting mod A with LRAD. OT Short Term Goal 4 (Week 1): Patient will complete standing ADL using LRAD and min A for balance/safety. OT Short Term Goal 5 (Week 1): Patient will complete ADL task with min vc's for problem-solving.  Skilled Therapeutic Interventions/Progress Updates:    Pt resting in bed upon arrival.  Pt stated she was "not having a good day." Pt stated that she felt like she was more confused and just wanted to lay in bed and not do anything. Pt stated she was concerned because she felt like this was a reversal of the trend. Pt agreeable to getting OOB and amb in room with RW. Pt stood at sink for simple grooming tasks. Pt conversation somewhat tangential but pt with awareness and able to self correct. All standing and amb with close supervisoin. Pt returned to bed and remained in bed with bed alarm activated. All needs within reach.   Therapy Documentation Precautions:  Precautions Precautions: Fall Precaution Comments: watch for orthostatic hypotension, contact precautions Restrictions Weight Bearing Restrictions: No LUE Weight Bearing: Touch down weight bearing LLE Weight Bearing: Weight bearing as tolerated   Pain: Pt denies pain this afternoon   Therapy/Group: Individual Therapy  Leroy Libman 03/26/2021, 2:34 PM

## 2021-03-26 NOTE — Progress Notes (Addendum)
Occupational Therapy Session Note  Patient Details  Name: Kayla Harrison MRN: [id number] Date of Birth: 02-Jul-1946  Today's Date: 03/26/2021 OT Individual Time: 7322-0254 OT Individual Time Calculation (min): 47 min    Short Term Goals: Week 1:  OT Short Term Goal 1 (Week 1): Patient will complete LB dressing mod A using AE PRN. OT Short Term Goal 2 (Week 1): Patient will complete toilet transfer min A with LRAD. OT Short Term Goal 3 (Week 1): Patient will complete toileting mod A with LRAD. OT Short Term Goal 4 (Week 1): Patient will complete standing ADL using LRAD and min A for balance/safety. OT Short Term Goal 5 (Week 1): Patient will complete ADL task with min vc's for problem-solving.  Skilled Therapeutic Interventions/Progress Updates:    Pt received supine in bed, agreeable to OT requesting to shower, no pain reported. Pt expressed feeling extra tired this morning and then requested toileting due to urinary urgency. Incontinent and continent of bladder. Sup>sit with HOB elevated close spvsn. Sit<>stand with RW CGA. Stand step transfer bed<>BSC CGA. While sitting on BSC, pt reported feeling lightheaded and SOB. Vitals taken and pt demoed orthostatic vitals; unable to complete in standing due to fatigue; note TED hose not donned due to urinary urgency. Perihygiene completed set up A. UB dressing spvsn EOB with vc's for latching bra hooks. LB dressing seated and in stance mod A to pull over hips and to thread due to symptomatic vitals (pt still requesting to don pants). Oral hygiene set up A bed level. Pt's symptoms improved supine, required frequent reminders of BP likely contributing to symptoms; RN informed. Session concluded due to symptomatic vitals; will check back time permitting to make up missed time. Remained supine, bed alarm set, provided with water, all needs met.  Therapy Documentation Precautions:  Precautions Precautions: Fall Precaution Comments: watch for orthostatic  hypotension, contact precautions Restrictions Weight Bearing Restrictions: No LUE Weight Bearing: Touch down weight bearing LLE Weight Bearing: Weight bearing as tolerated General: General OT Amount of Missed Time: 28 Minutes Vital Signs:  BP - 94/64 seated, 105 HR  112/67 supine, 107 HR Unable to tolerate standing for BP. Pain: Pain Assessment Pain Scale: 0-10 Pain Score: 0-No pain   Therapy/Group: Individual Therapy  Mellissa Kohut 03/26/2021, 8:38 AM

## 2021-03-26 NOTE — Progress Notes (Addendum)
Wound care done, denies having pain. Left inner thigh wound has yellowish center with firm tissue surroundings. Left hip wound still has small tunneling but is pink and clean. Also firm and red surrounding tissue evident. Patient is concerned that her wounds are not healing well. Reassurance given. May need further explanation by [facility] or MD for further reassurance.

## 2021-03-26 NOTE — Progress Notes (Signed)
Speech Language Pathology Daily Session Note  Patient Details  Name: Kayla Harrison MRN: [id number] Date of Birth: [birth year]  Today's Date: 03/26/2021 SLP Individual Time: 1003-1045 SLP Individual Time Calculation (min): 42 min  Short Term Goals: Week 1: SLP Short Term Goal 1 (Week 1): Patient will demonstrate functional problem solving for mildly complex tasks with Mod verbal cues. SLP Short Term Goal 2 (Week 1): Patient will recall new, daily information with Mod A multimodal cues for use of compnesatory strategies. SLP Short Term Goal 3 (Week 1): Patient will demosntrate selective attention to tasks in a mildly distracting enviornment for 30 minutes with Min verbal cues for redirection. SLP Short Term Goal 4 (Week 1): Patient will self-monitor and correct errors during functional tasks with Mod A verbal cues.  Skilled Therapeutic Interventions: Skilled ST services focused on cognitive skills. SLP facilitated mildly complex problem solving and error awareness skills in calendar task pt required mod A verbal cues for error awareness, not recording enough detailed information and supervision A verbal cues for problem solving. SLP started mildly complex account balancing task, pt required max-mod A verbal cues, but task was left in room to be completed next session due to time restraint. Pt was left with call bell with in reach and bed alarm set. Recommend to continue skilled ST services.      Pain Pain Assessment Pain Scale: 0-10 Pain Score: 0-No pain  Therapy/Group: Individual Therapy  Leronda Lewers  [facility] 03/26/2021, 10:18 AM

## 2021-03-26 NOTE — Plan of Care (Signed)
  Problem: Consults Goal: RH STROKE PATIENT EDUCATION Description: See Patient Education module for education specifics  Outcome: Progressing   Problem: RH BOWEL ELIMINATION Goal: RH STG MANAGE BOWEL WITH ASSISTANCE Description: STG Manage Bowel with  mod I  Assistance. Outcome: Progressing Goal: RH STG MANAGE BOWEL W/MEDICATION W/ASSISTANCE Description: STG Manage Bowel with Medication with mod I Assistance. Outcome: Progressing   Problem: RH BLADDER ELIMINATION Goal: RH STG MANAGE BLADDER WITH ASSISTANCE Description: STG Manage Bladder Without Assistance Outcome: Progressing   Problem: RH SKIN INTEGRITY Goal: RH STG MAINTAIN SKIN INTEGRITY WITH ASSISTANCE Description: STG Maintain Skin Integrity With  min Assistance. Outcome: Progressing Goal: RH STG ABLE TO PERFORM INCISION/WOUND CARE W/ASSISTANCE Description: STG Able To Perform Incision/Wound Care With min Assistance. Outcome: Progressing   Problem: RH SAFETY Goal: RH STG ADHERE TO SAFETY PRECAUTIONS W/ASSISTANCE/DEVICE Description: STG Adhere to Safety Precautions With  cues/reminders assistance/Device. Outcome: Progressing   Problem: RH PAIN MANAGEMENT Goal: RH STG PAIN MANAGED AT OR BELOW PT'S PAIN GOAL Description: At or below level 4 Outcome: Progressing   Problem: RH KNOWLEDGE DEFICIT Goal: RH STG INCREASE KNOWLEDGE OF HYPERTENSION Description: Patient will be able to manage HTN with medications and dietary modifications using handouts and educational tools independently Outcome: Progressing Goal: RH STG INCREASE KNOWLEGDE OF HYPERLIPIDEMIA Description: Patient will be able to manage HLD with medications and dietary modifications using handouts and educational tools independently Outcome: Progressing

## 2021-03-26 NOTE — Progress Notes (Addendum)
PROGRESS NOTE   Subjective/Complaints:   Working with SLP Felt dizzy during OT today   ROS: Limited due to cognitive/behavioral   Objective:   No results found. No results for input(s): WBC, HGB, HCT, PLT in the last 72 hours.  No results for input(s): NA, K, CL, CO2, GLUCOSE, BUN, CREATININE, CALCIUM in the last 72 hours.   Intake/Output Summary (Last 24 hours) at 03/26/2021 1019 Last data filed at 03/26/2021 7371 Gross per 24 hour  Intake 440 ml  Output --  Net 440 ml      Pressure Injury 03/15/21 Thigh Left;Medial Stage 2 -  Partial thickness loss of dermis presenting as a shallow open injury with a red, pink wound bed without slough. (Active)  03/15/21 0017  Location: Thigh  Location Orientation: Left;Medial  Staging: Stage 2 -  Partial thickness loss of dermis presenting as a shallow open injury with a red, pink wound bed without slough.  Wound Description (Comments):   Present on Admission: Yes    Physical Exam: Vital Signs Blood pressure 122/81, pulse 100, temperature 98.3 F (36.8 C), temperature source Oral, resp. rate 16, height 5\' 6"  (1.676 m), weight 88.8 kg, SpO2 96 %.   General: No acute distress Mood and affect are appropriate Heart: Regular rate and rhythm no rubs murmurs or extra sounds Lungs: Clear to auscultation, breathing unlabored, no rales or wheezes Abdomen: Positive bowel sounds, soft nontender to palpation, nondistended Extremities: No clubbing, cyanosis, or edema   Skin: Left post thigh wound with dressing,  Neurologic: oriented to self. New STM deficits. Follows basic commands. Decreased insight and awareness. Cranial nerves II through XII intact, motor strength is 4/5 in bilateral deltoid, bicep, tricep, grip, 3-/5 Bilateral hip flexor, knee extensors, ankle dorsiflexor and plantar flexor Sensory exam normal sensation to light touch in bilateral upper and lower  extremities Cerebellar- no dysmetria bilateral FNF Musculoskeletal: tight heel cords bilateral --no change    Assessment/Plan: 1. Functional deficits which require 3+ hours per day of interdisciplinary therapy in a comprehensive inpatient rehab setting. Physiatrist is providing close team supervision and 24 hour management of active medical problems listed below. Physiatrist and rehab team continue to assess barriers to discharge/monitor patient progress toward functional and medical goals  Care Tool:  Bathing    Body parts bathed by patient: Right arm, Left arm, Chest, Abdomen, Front perineal area, Buttocks, Right upper leg, Left upper leg, Right lower leg, Left lower leg, Face         Bathing assist Assist Level: Contact Guard/Touching assist     Upper Body Dressing/Undressing Upper body dressing   What is the patient wearing?: Pull over shirt, Bra    Upper body assist Assist Level: Supervision/Verbal cueing    Lower Body Dressing/Undressing Lower body dressing      What is the patient wearing?: Pants     Lower body assist Assist for lower body dressing: Moderate Assistance - Patient 50 - 74%     Toileting Toileting    Toileting assist Assist for toileting: Moderate Assistance - Patient 50 - 74%     Transfers Chair/bed transfer  Transfers assist     Chair/bed transfer assist level: Contact  Guard/Touching assist Chair/bed transfer assistive device: Museum/gallery exhibitions officer assist      Assist level: Contact Guard/Touching assist Assistive device: Walker-rolling Max distance: 50   Walk 10 feet activity   Assist     Assist level: Contact Guard/Touching assist Assistive device: Walker-rolling   Walk 50 feet activity   Assist Walk 50 feet with 2 turns activity did not occur: Safety/medical concerns  Assist level: Contact Guard/Touching assist Assistive device: Walker-rolling    Walk 150 feet activity   Assist Walk  150 feet activity did not occur: Safety/medical concerns         Walk 10 feet on uneven surface  activity   Assist Walk 10 feet on uneven surfaces activity did not occur: Safety/medical concerns         Wheelchair     Assist Will patient use wheelchair at discharge?: Yes (Per PT long term goals)   Wheelchair activity did not occur: Safety/medical concerns         Wheelchair 50 feet with 2 turns activity    Assist    Wheelchair 50 feet with 2 turns activity did not occur: Safety/medical concerns       Wheelchair 150 feet activity     Assist  Wheelchair 150 feet activity did not occur: Safety/medical concerns       Blood pressure 122/81, pulse 100, temperature 98.3 F (36.8 C), temperature source Oral, resp. rate 16, height 5\' 6"  (1.676 m), weight 88.8 kg, SpO2 96 %.  Medical Problem List and Plan: 1.  Altered mental status secondary to small left caudate head ICH with small IVH secondary hypertension and anticoagulation use             -patient may shower with thigh wounds covered              -ELOS/Goals: 14-16 days, min assist to supervision goals with PT, OT and mod I to supervision with SLP  -Continue CIR therapies including PT, OT, and SLP  2.  Antithrombotics: -DVT/anticoagulation: SCDs             -antiplatelet therapy:  ASA 81mg  daily cleared by neuro 3. Pain Management: Tylenol as needed 4. Mood: Provide emotional support             -antipsychotic agents: N/A 5. Neuropsych: This patient is not capable of making decisions on her own behalf. 7. Fluids/Electrolytes/Nutrition: Routine in and outs with follow-up chemistries Recheck BMET given issues with orthostasis  8.  Hypertension.  Lisinopril 10 mg daily.  No longer on Lisinopril Monitor with increased mobility Vitals:   03/25/21 1935 03/26/21 0424  BP: 131/81 122/81  Pulse: 95 100  Resp: 18 16  Temp: 98.3 F (36.8 C) 98.3 F (36.8 C)  SpO2: 97% 96%   CHeck Ortho vitals  9.   Atrial fibrillation/pacemaker.  Amiodarone 200 mg twice daily.   Eliquis discontinued due to [place]- now only on ASA 81mg  daily  10.  Hypothyroidism.  Synthroid 11.  Chronic left thigh wound.  Followed by Dr. Lucia Gaskins.  WOC following while in the hospital.  Dressing changes daily and prn.   -Wound VAC discontinued 03/21/2021 12.  NHL with SVC syndrome/lung cancer radiation therapy as well as chemo.  Followed by Dr.Gudena 13.  Obesity.  Status post lap band surgery.  BMI 32.10.  Dietary follow-up 14.  Acute on chronic anemia.  Follow-up CBC Hbg improved to 10.3  15- constipation on Senna 2 po bid. Received fleets  -  seems to be moving bowels regularly now  LOS: 7 days A FACE TO FACE EVALUATION WAS PERFORMED  Charlett Blake 03/26/2021, 10:19 AM

## 2021-03-26 NOTE — Progress Notes (Signed)
Physical Therapy Session Note  Patient Details  Name: Kayla Harrison MRN: [id number] Date of Birth: 1946-02-01  Today's Date: 03/26/2021 PT Individual Time: 1100-1200 PT Individual Time Calculation (min): 60 min   Short Term Goals: Week 1:  PT Short Term Goal 1 (Week 1): Patient will perform bed mobility with S PT Short Term Goal 2 (Week 1): Patient will transfer sit to stand with CGA. PT Short Term Goal 3 (Week 1): Patient will ambulate x 100' with min A. PT Short Term Goal 4 (Week 1): Patient negotiate at least 1 step with min A.  Skilled Therapeutic Interventions/Progress Updates:    Patient in supine and reports has a bad headache.  Reports worked with OT to get dressed.  States she had two previous sessions but was unable to work with the speech therapist.  Patient performed supine therex as noted below.  Supine to sit with S using rail and increased time.  Testing BP supine and standing as noted below.  Performed sit to stand with min to CGA.  Ambulated to and from door with RW and min A x 20' with c/o limited by dizziness. Sit to supine with close S and effort to rest.  Patient's friend and cardiologist in to visit and discuss per progress.  Upon his exit requested pt up in chair at end of session.  During rest time and visit continued working on memory as pt reported 7/10 pain in head initially, then when we called for medication again reported could not remember, but reported same value.  Patient also recalls here due to a stroke which she did not recall upon eval.  Patient supine to sit with CGA.  Seated to scoot to EOB with S.  Sit to stand to RW with CGA.  Ambulated to door and back to EOB with min A for safety.  Reports not as dizzy as first trip, but still dizzy.  Cleared path for pt to walk around bed to recliner.  Patient ambulated another 82' around bed to recliner with RW and CGA.  Noted brief soiled with urine so pt sit <> stand once more with assist to change brief.  Cues for  hands on walker for balance, but pt removed hands to help pull up pants needing CGA throughout for balance.  Patient left in recliner with alarm belt active, needs and call bell in reach and 2 visitors in the room.   Therapy Documentation Precautions:  Precautions Precautions: Fall Precaution Comments: watch for orthostatic hypotension, contact precautions Restrictions Weight Bearing Restrictions: No LUE Weight Bearing: Touch down weight bearing LLE Weight Bearing: Weight bearing as tolerated  Vital Signs:  Orthostatic VS for the past 24 hrs (Last 3 readings):  BP- Sitting Pulse- Sitting BP- Standing at 0 minutes Pulse- Standing at 0 minutes  03/26/21 1107 129/84 105 108/77 82    Pain: Pain Assessment Pain Scale: 0-10 Pain Score: 7  Pain Type: Acute pain Pain Location: Head Pain Descriptors / Indicators: Aching;Headache Pain Onset: Gradual Pain Intervention(s): RN made aware;Repositioned  Exercises: General Exercises - Lower Extremity Ankle Circles/Pumps: Strengthening;Both;20 reps;Supine Long Arc Quad: Strengthening;Both;10 reps;Seated Heel Slides: Strengthening;Both;10 reps;Supine Hip ABduction/ADduction: Strengthening;Both;10 reps;Supine Straight Leg Raises: Strengthening;Both;10 reps;Supine   Therapy/Group: Individual Therapy  Reginia Naas Magda Kiel, PT 03/26/2021, 11:27 AM

## 2021-03-27 DIAGNOSIS — I951 Orthostatic hypotension: Secondary | ICD-10-CM

## 2021-03-27 MED ORDER — MIDODRINE HCL 5 MG PO TABS
2.5000 mg | ORAL_TABLET | Freq: Two times a day (BID) | ORAL | Status: DC
  Administered 2021-03-28 – 2021-03-30 (×6): 2.5 mg via ORAL
  Filled 2021-03-27 (×6): qty 1

## 2021-03-27 NOTE — Progress Notes (Signed)
Remote pacemaker transmission.   

## 2021-03-27 NOTE — Progress Notes (Signed)
Physical Therapy Weekly Progress Note  Patient Details  Name: Kayla Harrison MRN: [id number] Date of Birth: July 22, 1946  Beginning of progress report period: March 20, 2021 End of progress report period: March 27, 2021  Today's Date: 03/27/2021 PT Individual Time: Session1: 3267-1245; [place]: 1515-1600 PT Individual Time Calculation (min): 45 min & 45 min  Patient has met 3 of 4 short term goals.  Patient has been able to perform bed mobility with S and increased time, and transfers with CGA.  She needs min A for ambulation and has been limited with ambulation distance due to BP issues and R LE weakness.  She did negotiate 1 step today in parallel bars, but has had difficulty with her knees limiting tolerance to steps.  Plans for discharge since she will need 24 hour support potentially moving to STSNF vs ALF placement.  Patient continues to demonstrate the following deficits muscle weakness, decreased problem solving and decreased memory, and decreased sitting balance and decreased standing balance and therefore will continue to benefit from skilled PT intervention to increase functional independence with mobility.  Patient progressing toward long term goals..  Continue plan of care.  PT Short Term Goals Week 1:  PT Short Term Goal 1 (Week 1): Patient will perform bed mobility with S PT Short Term Goal 1 - Progress (Week 1): Met PT Short Term Goal 2 (Week 1): Patient will transfer sit to stand with CGA. PT Short Term Goal 2 - Progress (Week 1): Met PT Short Term Goal 3 (Week 1): Patient will ambulate x 100' with min A. PT Short Term Goal 3 - Progress (Week 1): Progressing toward goal PT Short Term Goal 4 (Week 1): Patient negotiate at least 1 step with min A. PT Short Term Goal 4 - Progress (Week 1): Met Week 2:  PT Short Term Goal 1 (Week 2): STG=LTG due to ELOS  Skilled Therapeutic Interventions/Progress Updates:  Session1:  Patient in supine in bed with sister in the room.  She did  not recall session yesterday limited due to headache, but did remember her visitors.  Patient supine to sit with S and increased time and effort.  Patient sit to stand to RW with CGA.  Ambulated to bathroom min A due to negotiating lip on edge of bathroom and posterior LOB needing min A for recovery.  Patient completed toileting with min A for transfer and managing clothing.  Patient ambulated to w/c and performed handwashing seated.  Propelled w/c x 50' with close S and increased time.  Patient assisted in w/c to therapy gym and performed TUG with RW and CGA x 3 trials (64, 53 then 43 seconds respectiviely as noted below.)  Patient in parallel bars performed sit to stand with CGA cues for safety as pulling up on rails with posterior bias initially.  She performed step ups first to 6" step with min A with c/o knee pain.  Then tried 4" step with continued c/o knee pain so terminated.  Performed side stepping in bars holding with both UE's and 1-2 episodes of R knee buckling with min A.  Patient assisted in w/c to room.  Ambulated around then bed with RW and CGA to sit in recliner.  Left in recliner with call bell in reach and seat belt alarm active.   [place]:  Patient in supine and reports had a nap so feeling well.  States she does better in the afternoon usually.  Supine to sit with S and rail.  Seated adjusted TED  stockings as pt c/o digging into her leg.  Patient transferred throughout session with CGA and ambulated to bathroom.  Completed toileting with A for managing clothing due to balance.  Patient washed hands at sink with CGA no UE support.  In w/c assisted to dayroom and pt ambulated 2 x 72' with RW and min A.  Noted R knee buckle x 1 on second walk.  Patient ambulated 20' to and from [place] with RW and min A.  On Kinetron @ 50 cm/sec x 2 x 2 minutes with good tolerance, but after off Kinetron c/o R knee pain.  RN made aware pt requesting Tylenol.  Assisted in w/c to room and pt ambulated 15' to  recliner.  Left up in chair with feet up and encouraged to stay up till after dinner.  Call bell in reach and seat belt alarm active.   Ambulation/gait training;Community reintegration;DME/adaptive equipment instruction;Neuromuscular re-education;Psychosocial support;Stair training;UE/LE Strength taining/ROM;Wheelchair propulsion/positioning;UE/LE Coordination activities;Therapeutic Activities;Skin care/wound management;Functional electrical stimulation;Pain management;Discharge planning;Balance/vestibular training;Disease management/prevention;Functional mobility training;Patient/family education;Splinting/orthotics;Therapeutic Exercise;Cognitive remediation/compensation   Therapy Documentation Precautions:  Precautions Precautions: Fall Precaution Comments: watch for orthostatic hypotension, contact precautions Restrictions Weight Bearing Restrictions: No LUE Weight Bearing: Touch down weight bearing LLE Weight Bearing: Weight bearing as tolerated Pain: Pain Assessment Pain Scale: 0-10 Pain Score: 0-No pain Faces Pain Scale: Hurts little more Pain Type: Chronic pain Pain Location: Knee Pain Orientation: Right;Left Pain Descriptors / Indicators: Aching Pain Onset: With Activity Pain Intervention(s): Repositioned;Rest    Balance: Standardized Balance Assessment Standardized Balance Assessment: Timed Up and Go Test Timed Up and Go Test TUG: Normal TUG Normal TUG (seconds): 53 (average of 3 trials: 64, 53, 43 sec)   Therapy/Group: Individual Therapy  Reginia Naas Magda Kiel, PT 03/27/2021, 12:53 PM

## 2021-03-27 NOTE — Progress Notes (Signed)
Speech Language Pathology Weekly Progress and Session Note  Patient Details  Name: Kayla Harrison MRN: [id number] Date of Birth: 1945/12/30  Beginning of progress report period: March 19, 2021 End of progress report period: March 28, 2021  Today's Date: 03/27/2021 SLP Individual Time: 0051-1021 SLP Individual Time Calculation (min): 25 min  Short Term Goals: Week 1: SLP Short Term Goal 1 (Week 1): Patient will demonstrate functional problem solving for mildly complex tasks with Mod verbal cues. SLP Short Term Goal 1 - Progress (Week 1): Met SLP Short Term Goal 2 (Week 1): Patient will recall new, daily information with Mod A multimodal cues for use of compnesatory strategies. SLP Short Term Goal 2 - Progress (Week 1): Not met SLP Short Term Goal 3 (Week 1): Patient will demosntrate selective attention to tasks in a mildly distracting enviornment for 30 minutes with Min verbal cues for redirection. SLP Short Term Goal 3 - Progress (Week 1): Met SLP Short Term Goal 4 (Week 1): Patient will self-monitor and correct errors during functional tasks with Mod A verbal cues. SLP Short Term Goal 4 - Progress (Week 1): Met    New Short Term Goals: Week 2: SLP Short Term Goal 1 (Week 2): Patient will demonstrate functional problem solving for mildly complex tasks with Min verbal cues. SLP Short Term Goal 2 (Week 2): Patient will recall new, daily information with Mod A multimodal cues for use of compnesatory strategies. SLP Short Term Goal 3 (Week 2): Patient will demosntrate selective attention to tasks in a mildly distracting enviornment for 30 minutes with supervision verbal cues for redirection. SLP Short Term Goal 4 (Week 2): Patient will self-monitor and correct errors during functional tasks with Min A verbal cues.  Weekly Progress Updates: Patient has made functional gains and has met 3 of 4 STGs this reporting period. Currently, patient requires overall Mod A multimodal cues to complete  functional and mildly complex tasks safely in regards to problem solving and error awareness. However, patient continues to require overall Max A multimodal cues for recall of functional information with intermittent confusion noted. Patient and caregiver education ongoing. Patient would benefit from continued skilled SLP intervention to maximize her cognitive functioning and overall functional independence prior to discharge.      Intensity: Minumum of 1-2 x/day, 30 to 90 minutes Frequency: 3 to 5 out of 7 days Duration/Length of Stay: 7-10 days Treatment/Interventions: Cognitive remediation/compensation;Internal/external aids;Therapeutic Activities;Environmental controls;Cueing hierarchy;Functional tasks;Patient/family education   Daily Session  Skilled Therapeutic Interventions:  Skilled treatment session focused on cognitive goals. SLP facilitated session by providing extra time and overall Min A verbal cues for functional problem solving and error awareness during a complex medication management task. Patient demonstrated selective attention to task in a minimally distracting environment for 25 minutes with Mod I. Overall, patient demonstrates improved overall cognitive functioning since this clinician last saw her on day of evaluation.  Patient also reports improved recall and decrease in overall confusion. Patient left upright in bed with alarm on and all needs within reach. Continue with current plan of care.     Pain No/Denies Pain   Therapy/Group: Individual Therapy  Hadia Minier 03/27/2021, 6:55 AM

## 2021-03-27 NOTE — Patient Care Conference (Signed)
Inpatient RehabilitationTeam Conference and Plan of Care Update Date: 03/27/2021   Time: 12:13 PM    Patient Name: Kayla Harrison      Medical Record Number: [id number]  Date of Birth: Sep 25, 1945 Sex: Female         Room/Bed: 4W21C/4W21C-01 Payor Info: Payor: MEDICARE / Plan: MEDICARE PART A AND B / Product Type: *No Product type* /    Admit Date/Time:  03/19/2021  5:06 PM  Primary Diagnosis:  ICH (intracerebral hemorrhage) [facility])  Hospital Problems: Principal Problem:   ICH (intracerebral hemorrhage) [facility])    Expected Discharge Date: Expected Discharge Date:  (SNF pending)  Team Members Present: Physician leading conference: Dr. Alger Simons Care Coodinator Present: Dorien Chihuahua, RN, BSN, CRRN;Becky Dupree, LCSW Nurse Present: Dorien Chihuahua, RN PT Present: Magda Kiel, PT OT Present: Other (comment) Hulda Marin, OT) SLP Present: Weston Anna, SLP PPS Coordinator present : Gunnar Fusi, SLP     Current Status/Progress Goal Weekly Team Focus  Bowel/Bladder   cont. of b/b. LBM 03/25/21.  to remain continent  tolilet prn   Swallow/Nutrition/ Hydration             ADL's   Set up UB adls, Mod-total A LB dressing, CGA-min A transfers and functional ambulation with RW, limited by orthostatic vitals, memory, attention/awareness  mod I-spvsn  ADL task practice, AE education, dynamic standing and sitting balance/tolerance, endurance, cognition, general conditioning   Mobility   S bed mobility, CGA transfers, gait min A up to 50' with RW, limited by headache, orthostatic  S overall due to cognitive deficits  LE/core strength, balance, safety, cognitive remediation, activity tolerance, gait   Communication             Safety/Cognition/ Behavioral Observations  mod-min A  supervision A  error awareness, recall, mildly complex prroblem solving and selective attention   Pain   denies pain  free of pain  assess pain q shift and prn   Skin   L medial upper thigh, santyl wet  to dry dressing, wound yellowish, green drainage with foul odor. L hip to surgical incision.  would will continue to heel to further skin breakdown, free of infection  continue dressing changes as ordered, assess prn     Discharge Planning:  Plan is going to either SNF versus ALF, due to care needs will need 24/7 care. Working with [place]. Have asked neuro-psych to see Friday   Team Discussion: Patient not able to discharge home to ILF; sister to come in to assist however sister asking about SNFs in the area. Discussion of competency and POA to determine discharge destination as patient really wants to go home solo. Team recommending assistance/supervision at discharge for wound care and safety due to cognitive deficits post stroke.   Patient on target to meet rehab goals: Currently CGA for transfers., able to ambulate 38' with min assist. Patient's functional status varies; waxes and wanes due to cognitive deficits and problems with problem solving, and recall.  Progress also limited orthostasis, knee pain and knee buckling   *See Care Plan and progress notes for long and short-term goals.   Revisions to Treatment Plan:    Teaching Needs: Wound care, medications, safety, secondary stroke risk management, etc  Current Barriers to Discharge: Home enviroment access/layout, Wound care, Lack of/limited family support, and Weight bearing restrictions  Possible Resolutions to Barriers: SNF recommended if family unable to provide supervision/assistance needed Family education     Medical Summary Current Status: left subcortical ICH with  cognitive and mobility deficits. left thigh wounds ongoing but improved.  Barriers to Discharge: Medical stability   Possible Resolutions to Barriers/Weekly Focus: wound care, daily assessment of pt data and labs.   Continued Need for Acute Rehabilitation Level of Care: The patient requires daily medical management by a physician with specialized  training in physical medicine and rehabilitation for the following reasons: Direction of a multidisciplinary physical rehabilitation program to maximize functional independence : Yes Medical management of patient stability for increased activity during participation in an intensive rehabilitation regime.: Yes Analysis of laboratory values and/or radiology reports with any subsequent need for medication adjustment and/or medical intervention. : Yes   I attest that I was present, lead the team conference, and concur with the assessment and plan of the team.   Dorien Chihuahua B 03/27/2021, 2:59 PM

## 2021-03-27 NOTE — Progress Notes (Signed)
PROGRESS NOTE   Subjective/Complaints: Dizzy again in therapy today, BP's orthostatic yesterday  ROS: Patient denies fever, rash, sore throat, blurred vision, nausea, vomiting, diarrhea, cough, shortness of breath or chest pain, joint or back pain, headache, or mood change.    Objective:   No results found. No results for input(s): WBC, HGB, HCT, PLT in the last 72 hours.  Recent Labs    03/26/21 1048  NA 133*  K 4.8  CL 103  CO2 24  GLUCOSE 107*  BUN 17  CREATININE 0.89  CALCIUM 8.4*     Intake/Output Summary (Last 24 hours) at 03/27/2021 1333 Last data filed at 03/27/2021 0700 Gross per 24 hour  Intake 500 ml  Output --  Net 500 ml     Pressure Injury 03/15/21 Thigh Left;Medial Stage 2 -  Partial thickness loss of dermis presenting as a shallow open injury with a red, pink wound bed without slough. (Active)  03/15/21 0017  Location: Thigh  Location Orientation: Left;Medial  Staging: Stage 2 -  Partial thickness loss of dermis presenting as a shallow open injury with a red, pink wound bed without slough.  Wound Description (Comments):   Present on Admission: Yes    Physical Exam: Vital Signs Blood pressure 117/87, pulse (!) 107, temperature 98.1 F (36.7 C), temperature source Oral, resp. rate 16, height 5\' 6"  (1.676 m), weight 88.8 kg, SpO2 96 %.   Constitutional: No distress . Vital signs reviewed. HEENT: EOMI, oral membranes moist Neck: supple Cardiovascular: tachy. No JVD    Respiratory/Chest: CTA Bilaterally without wheezes or rales. Normal effort    GI/Abdomen: BS +, non-tender, non-distended Ext: no clubbing, cyanosis, or edema Psych: pleasant and cooperative  Skin: Left post thigh wound with dressing,  Neurologic: oriented to self, stm deficits. Follows basic commands. impaired insight and awareness. Cranial nerves II through XII intact, motor strength is 4/5 in bilateral deltoid, bicep, tricep,  grip, 3-/5 Bilateral hip flexor, knee extensors, ankle dorsiflexor and plantar flexor Sensory exam normal sensation to light touch in bilateral upper and lower extremities Cerebellar- no dysmetria bilateral FNF Musculoskeletal: left thigh sl tender. Tight heel cords    Assessment/Plan: 1. Functional deficits which require 3+ hours per day of interdisciplinary therapy in a comprehensive inpatient rehab setting. Physiatrist is providing close team supervision and 24 hour management of active medical problems listed below. Physiatrist and rehab team continue to assess barriers to discharge/monitor patient progress toward functional and medical goals  Care Tool:  Bathing    Body parts bathed by patient: Right arm, Left arm, Chest, Abdomen, Front perineal area, Buttocks, Right upper leg, Left upper leg, Right lower leg, Left lower leg, Face         Bathing assist Assist Level: Contact Guard/Touching assist     Upper Body Dressing/Undressing Upper body dressing   What is the patient wearing?: Pull over shirt, Bra    Upper body assist Assist Level: Supervision/Verbal cueing    Lower Body Dressing/Undressing Lower body dressing      What is the patient wearing?: Pants     Lower body assist Assist for lower body dressing: Moderate Assistance - Patient 50 - 74%  Toileting Toileting    Toileting assist Assist for toileting: Moderate Assistance - Patient 50 - 74%     Transfers Chair/bed transfer  Transfers assist     Chair/bed transfer assist level: Contact Guard/Touching assist Chair/bed transfer assistive device: Programmer, multimedia   Ambulation assist      Assist level: Minimal Assistance - Patient > 75% Assistive device: Walker-rolling Max distance: 20'   Walk 10 feet activity   Assist     Assist level: Minimal Assistance - Patient > 75% Assistive device: Walker-rolling   Walk 50 feet activity   Assist Walk 50 feet with 2 turns  activity did not occur: Safety/medical concerns  Assist level: Contact Guard/Touching assist Assistive device: Walker-rolling    Walk 150 feet activity   Assist Walk 150 feet activity did not occur: Safety/medical concerns         Walk 10 feet on uneven surface  activity   Assist Walk 10 feet on uneven surfaces activity did not occur: Safety/medical concerns         Wheelchair     Assist Will patient use wheelchair at discharge?: Yes (Per PT long term goals)   Wheelchair activity did not occur: Safety/medical concerns         Wheelchair 50 feet with 2 turns activity    Assist    Wheelchair 50 feet with 2 turns activity did not occur: Safety/medical concerns       Wheelchair 150 feet activity     Assist  Wheelchair 150 feet activity did not occur: Safety/medical concerns       Blood pressure 117/87, pulse (!) 107, temperature 98.1 F (36.7 C), temperature source Oral, resp. rate 16, height 5\' 6"  (1.676 m), weight 88.8 kg, SpO2 96 %.  Medical Problem List and Plan: 1.  Altered mental status secondary to small left caudate head ICH with small IVH secondary hypertension and anticoagulation use             -patient may shower with thigh wounds covered              -ELOS/Goals: 14-16 days, min assist to supervision goals with PT, OT and mod I to supervision with SLP  -Continue CIR therapies including PT, OT, and SLP   2.  Antithrombotics: -DVT/anticoagulation: SCDs             -antiplatelet therapy:  ASA 81mg  daily cleared by neuro 3. Pain Management: Tylenol as needed 4. Mood: Provide emotional support             -antipsychotic agents: N/A 5. Neuropsych: This patient is not capable of making decisions on her own behalf. 7. Fluids/Electrolytes/Nutrition: Routine in and outs with follow-up chemistries Recheck BMET given issues with orthostasis  8.  Hypertension.  Lisinopril 10 mg daily.  No longer on Lisinopril Monitor with increased  mobility Vitals:   03/26/21 1335 03/27/21 0307  BP: 111/71 117/87  Pulse: (!) 103 (!) 107  Resp: 17 16  Temp: 98 F (36.7 C) 98.1 F (36.7 C)  SpO2: 98% 96%   7/5 BP's still low, ortho vs +, abd binder, add low dose midodrine 5mg  bid  9.  Atrial fibrillation/pacemaker.  Amiodarone 200 mg twice daily.   Eliquis discontinued due to [place]- now only on ASA 81mg  daily  10.  Hypothyroidism.  Synthroid 11.  Chronic left thigh wound.  Followed by Dr. Lucia Gaskins.  WOC following while in the hospital.  Dressing changes daily and prn.   -Wound VAC  discontinued 03/21/2021 12.  NHL with SVC syndrome/lung cancer radiation therapy as well as chemo.  Followed by Dr.Gudena 13.  Obesity.  Status post lap band surgery.  BMI 32.10.  Dietary follow-up 14.  Acute on chronic anemia.  Follow-up CBC Hbg improved to 10.3  15- constipation on Senna 2 po bid.    -2 large bm's this morning  LOS: 8 days A FACE TO FACE EVALUATION WAS PERFORMED  Meredith Staggers 03/27/2021, 1:33 PM

## 2021-03-27 NOTE — Progress Notes (Signed)
Occupational Therapy Weekly Progress Note  Patient Details  Name: Kayla Harrison MRN: [id number] Date of Birth: Apr 22, 1946  Beginning of progress report period: March 20, 2021 End of progress report period: March 27, 2021  Today's Date: 03/27/2021 OT Individual Time: 0737-1062 and 1316-1406 OT Individual Time Calculation (min): 44 min and 40 min   Patient has met 5 of 5 short term goals.  Patient has improved with functional mobility and ADL task performance seated and in standing this week. UB ADLs completed usually with set up A. LB ADLs completed with min-mod A depending on positioning, fatigue, and AE availability. Pt regularly completing stand step transfers with RW for functional transfers to [facility] or standard height toilet and completing 3/3 toileting tasks with no more than min-mod A depending on fatigue levels in stance. Pt's cognition has waxed and waned this week, but improving slightly in past few days with better recall of new information and orientation, improved attention, and improved problem solving. Pt has been limited the past few days in OT sessions due to orthostatic vitals/symptomatic.   Patient continues to demonstrate the following deficits: muscle weakness, decreased cardiorespiratoy endurance, decreased awareness, decreased problem solving, decreased safety awareness, and decreased memory, and decreased standing balance and decreased balance strategies and therefore will continue to benefit from skilled OT intervention to enhance overall performance with BADL, iADL, and Reduce care partner burden.  Patient progressing toward long term goals..  Continue plan of care.  OT Short Term Goals Week 2:  OT Short Term Goal 1 (Week 2): Patient will complete LB dressing min A consistently with LRAD/AE PRN. OT Short Term Goal 2 (Week 2): Patient will complete toilet transfers CGA consistently with LRAD. OT Short Term Goal 3 (Week 2): Patient will complete 3/3 toileting tasks with CGA  consistently using LRAD. OT Short Term Goal 4 (Week 2): Patient will tolerate standing balance in preparation for standing level ADLs for at least 1 minute with CGA. OT Short Term Goal 5 (Week 2): Patient will complete standing level grooming task with no overt LOB using LRAD with CGA.  Skilled Therapeutic Interventions/Progress Updates:    Session 1: Pt received supine, agreeable to OT, reporting no pain. Pt expressed desire to shower; attempted orthostatic vitals without TED hose in preparation for showering. See below for vitals - pt symptomatic stating she felt somewhat lightheaded seated EOB. Discontinued showering plan due to low BP; donned TED hose dependently supine with slight improvement in BP supine. Vitals taken again supine, sitting, and in stance; unable to maintain standing to complete vitals as pt expressed "I am going to pass out"; improved symptoms upon supine. LB dressing (pants) completed bed level mod A to pull over hips using rolling technique; pt able to sit in long sitting to assist with threading pants over legs. While donning/doffing socks, pt unable to maintain RLE figure 4 position without min A. Doffed L nonskid sock spvsn; donned nonskid socks mod A in figure 4 position. Provided with ice per pt request, sister entered session. Pt remained supine, alarm set, all immediate needs met, RN informed of orthostatic vitals; pt missed 16 minutes of skilled OT due to orthostatic vitals/fatigue - will attempt to make up minutes as able.   Session 2: *Missed minutes from previous session made up during this session. Pt received seated in recliner, agreeable to OT, reporting no pain and feeling better than AM session. Pt requesting to use bathroom. Sit<>stand CGA-min A for boost up. Functional ambulation with RW and CGA ~  20' recliner>bathroom>w/c. Pt attempted toileting at regular height toilet with close spvsn and CGA for clothing management in stance; demoed one slight LOB in stance with  BUEs unsupported for clothing management, min A recovery. Pt reported feeling slightly lightheaded during functional ambulation but not as bad as this morning. Pt unable to urinate despite extra time for attempt. Washed hands and completed oral hygiene seated in w/c set up A. Pt self-propelled w/c ~40' with spvsn/vc's for increased stroke length for efficiency, demoing fatigue and requiring OT to take over. Pt engaged in seated table top activity targeting dynamic balance, reaching outside BOS for anterior weight shift, activity tolerance, and cognition (sequencing, following rules of simple game, attention, reasoning). Pt did not require rest breaks during activity, but did require mod vc's for recall of game rules in mildly distracting environment; no vc's for attention required. Pt returned to room, stand step transfer with RW w/c>bed CGA with pt complaining of R knee pain 8/10. Sit>sup close spvsn. Pt remained supine, alarm set, all immediate needs met.  Therapy Documentation Precautions:  Precautions Precautions: Fall Precaution Comments: watch for orthostatic hypotension, contact precautions Restrictions Weight Bearing Restrictions: No LUE Weight Bearing: Touch down weight bearing LLE Weight Bearing: Weight bearing as tolerated   Vital Signs: Session 1: BP without TEDs - supine: 112/83, sitting EOB 103/78 With TEDs: supine 127/90, sitting EOB trial 1 99/65, trial 2 106/81, standing trial (did not fully complete in stance due to pt symptomatic; potential error in reading) 59/50 with HR elevated to 144bpm. Returned to supine - 125/85  Pain: Pain Assessment Pain Scale: 0-10 Pain Score: 0-No pain   Therapy/Group: Individual Therapy  Mellissa Kohut 03/27/2021, 7:33 AM

## 2021-03-28 NOTE — Progress Notes (Signed)
During assessment patient has a large hardened area on left flank. Patient stated there was no pain when I touched it.  Idamae Schuller, LPN

## 2021-03-28 NOTE — Progress Notes (Signed)
Orthopedic Tech Progress Note Patient Details:  Kayla Harrison 1946-06-19 [id number] Went to apply AD but instead therapy applied AD Ortho Devices Type of Ortho Device: Abdominal binder Ortho Device/Splint Interventions: Ordered      Kayla Harrison 03/28/2021, 10:54 AM

## 2021-03-28 NOTE — Progress Notes (Addendum)
Physical Therapy Session Note  Patient Details  Name: Kayla Harrison MRN: [id number] Date of Birth: 01-05-1946  Today's Date: 03/28/2021 PT Individual Time:Session1: [id number] (nursing in [id number]); Arthor Captain: [id number] PT Individual Time Calculation (min): 60 min (45 min) & 45 min  Short Term Goals: Week 2:  PT Short Term Goal 1 (Week 2): STG=LTG due to ELOS  Skilled Therapeutic Interventions/Progress Updates:    Session1:  Patient in supine and reports had BP issues overnight and they put on her stockings, just had them taken off and wants to leave them off.  BP checked as noted below with pt needing S for supine to sit donned pants seated EOB with min A and CGA sit to stand to pull up pants during BP check pt unable to stand full time till reading due to symptoms.  RN in to assess due to MEWS yellow earlier.  PT left for RN assessment and medications. Returned and assisted to don thigh high TED stockings.  Patient supine to sit with less symptoms.  Sit to stand with CGA and ambulation around bed to recliner with RW and min A for safety R knee minor buckling and shuffling feet.  Left with LE's elevated and seat belt alarm active, needs and call bell in reach.  Blunt: Patient in recliner in room and reports feeling well.  Requesting to toilet.  Performed sit to stand to RW with CGA.  Ambulated to bathroom 15' with CGA.  Completed toileted with min A for balance and ensuring clothing management.  Patient washed hands at sink with CGA.  Seated in w/c assisted to dayroom.  Patient ambulated 65' x 2 with RW and min A with c/o R knee buckling x 3 and R knee pain with second walk.  Patient seated for LAQ x 10 x 3 sets w/ 5# weight.  Standing hip flexion x 10 with UE support.  C/o feeling dizzy and second set of orthostatics taken as noted below. Reported still feeling a little dizzy sitting so discussed could be due to stroke or due to medications, etc.  Patient assisted in w/c to room.  Performed  stand step to bed with RW and min A x 5'.  Sit to supine S and doffed TED's at pt request.  Left with call bell/needs in reach and bed alarm active.   Therapy Documentation Precautions:  Precautions Precautions: Fall Precaution Comments: watch for orthostatic hypotension, contact precautions Restrictions Weight Bearing Restrictions: No LUE Weight Bearing: Touch down weight bearing LLE Weight Bearing: Weight bearing as tolerated General: PT Amount of Missed Time (min): 15 Minutes PT Missed Treatment Reason: Nursing care Vital Signs: Orthostatic VS for the past 24 hrs (Last 3 readings):  BP- Lying Pulse- Lying BP- Sitting Pulse- Sitting BP- Standing at 0 minutes Pulse- Standing at 0 minutes  03/28/21 1701 -- -- 96/82 97 94/62 104  03/28/21 0800 97/60 108 (!) 86/57 116 (!) 79/58 122    Pain: Pain Assessment Pain Score: 4  Pain Type: Acute pain Pain Location: Knee Pain Orientation: Right Pain Descriptors / Indicators: Aching Pain Onset: With Activity Pain Intervention(s): Repositioned   Therapy/Group: Individual Therapy  Reginia Naas Magda Kiel, PT 03/28/2021, 8:44 AM

## 2021-03-28 NOTE — Progress Notes (Signed)
Patient ID: Kayla Harrison, female   DOB: 1946/01/28, 75 y.o.   MRN: [id number]  Spoke with sister-mary via telephone to discuss team conference and pt. Discussed pt will require 24/7 care and SNF is the most appropriate facility for her to continue her rehab. Sister gave this worker her preferences and will begin bed search. Pt is agreeable at this moment. [name] is her POA/HCPOA.

## 2021-03-28 NOTE — Progress Notes (Signed)
Occupational Therapy Session Note  Patient Details  Name: Kayla Harrison MRN: [id number] Date of Birth: 10/05/1945  Today's Date: 03/28/2021 OT Individual Time: 4782-9562 and 1346-1446 OT Individual Time Calculation (min): 48 min and 60 min   Short Term Goals: Week 2:  OT Short Term Goal 1 (Week 2): Patient will complete LB dressing min A consistently with LRAD/AE PRN. OT Short Term Goal 2 (Week 2): Patient will complete toilet transfers CGA consistently with LRAD. OT Short Term Goal 3 (Week 2): Patient will complete 3/3 toileting tasks with CGA consistently using LRAD. OT Short Term Goal 4 (Week 2): Patient will tolerate standing balance in preparation for standing level ADLs for at least 1 minute with CGA. OT Short Term Goal 5 (Week 2): Patient will complete standing level grooming task with no overt LOB using LRAD with CGA.  Skilled Therapeutic Interventions/Progress Updates:    Session 1: Pt received seated in recliner, agreeable to OT, reporting feeling lightheaded-dizzy with PT this AM and headache throughout session. Pt limited by fatigue and orthostatic vitals this session. Vitals checked with thigh high TED hose already donned, seated and attempted standing; unable to complete standing BP, but significant drop noted following attempt. Sit<>stand min-CGA with RW throughout session due to fatigue and dizziness. Provided gingerale and cranberry juice per pt request. Stand step transfer recliner>w/c CGA, felt more faint and increased headache. Stand step transfer w/c>bed without AD HHA. Sit>sup x2 close spvsn with RN entering for wound dressing changes. Doffed pants supine rolling technique max A for time and fatigue. Pt requesting toileting using BSC; continent of bladder. Stand pivot transfer with RW bed<>BSC CGA-min A with increased time due to dizziness and R knee buckling. Toileting mod A for brief management. Rolling close spvsn with bed rails. Pt remained supine, RN present, all  immediate needs met. RN ordered abdominal binder to assist with orthostatic vitals. Missed 12 minutes skilled OT due to orthostatic vitals, pt fatigue and pain; will attempt to make up as appropriate.  Session 2: Pt received supine, agreeable to OT, reporting feeling better and no pain interfering with session. Pt requesting toileting. Sup>sit close spvsn. Sit<>stand CGA with RW. Stand step transfer with RW bed<>BSC CGA. Pt continent of bladder; completed 3/3 toileting tasks with min A to help pull pants up over hips. Stand step transfer bed>w/c CGA. Completed grooming tasks including washing hands, oral hygiene, and washing face set up A at sink in w/c. Pt wheeled to gym due to fatigue. Attempted standing tolerance trial without AD at high table top during functional activity; pt tolerated standing 30 seconds before requiring seated rest break and complaining of light headedness. Vitals taken seated (see below). Discontinued standing trials due to symptomatic; completed table top task seated in w/c focusing on dynamic sitting balance and cognition. Pt completed ~15 min task with back unsupported in min distracting environment with only one vc for attention to task. Lateral and anterior leans outside BOS with close spvsn and no overt LOB to obtain task objects. Pt engaged in [place] task requiring executive functioning, problem solving, and awareness skills. Pt demoed challenges with short term recall when new rules added (ex. mod vc's for reminders to only use one hand). Pt demoed appropriate skills for problem solving; occasionally requiring vc's for awareness to task. [place] excellent throughout task. Pt self-propelled w/c ~50' before requesting rest break due to fatigue. Returned to room; provided with gingerale per pt request. Stand step transfer w/c>recliner CGA. Pt remained seated in recliner, alarm set,  call bell in hand, all immediate needs met.  Therapy Documentation Precautions:   Precautions Precautions: Fall Precaution Comments: watch for orthostatic hypotension, contact precautions Restrictions Weight Bearing Restrictions: No LUE Weight Bearing: Touch down weight bearing LLE Weight Bearing: Weight bearing as tolerated General: General (Session 1) OT Amount of Missed Time: 12 Minutes OT Missed Treatment Reason: Patient ill; patient fatigue  Vital Signs: Session 1:  BP Sitting 136/110, HR 107 BP after attempted standing vitals, but completed sitting as unable to remain standing: 97/62 and HR 118  Session 2:  BP sitting after standing attempt for 30 seconds: 110/65  Pain: Pain Assessment Pain Scale: 0-10 Pain Score:  (unrated) Pain Type: Acute pain Pain Location: Head Pain Orientation: Mid Pain Descriptors / Indicators: Aching;Headache Pain Onset: With Activity Pain Intervention(s): Repositioned;Emotional support;Rest;Distraction Multiple Pain Sites: Yes 2nd Pain Site Pain Score:  (unrated) Pain Type: Acute pain Pain Location: Knee Pain Orientation: Right Pain Descriptors / Indicators: Aching;Sore Pain Intervention(s): Repositioned;Rest   Therapy/Group: Individual Therapy  Mellissa Kohut 03/28/2021, 7:54 AM

## 2021-03-28 NOTE — NC FL2 (Signed)
[place] LEVEL OF CARE SCREENING TOOL     IDENTIFICATION  Patient Name: Kayla Harrison Birthdate: 1945-12-16 Sex: female Admission Date (Current Location): 03/19/2021  [facility] and [place] Number:  Herbalist and Address:  [facility], [place] [address] [place], [place] [address]      Provider Number: [id number]  Attending Physician Name and Address:  Meredith Staggers, MD  Relative Name and Phone Number:  [name]-POA/HCPOA [phone]    Current Level of Care: Other (Comment) (rehab) Recommended Level of Care: [place] [facility] Prior Approval Number:    Date Approved/Denied:   PASRR Number: [id number]  Discharge Plan: SNF    Current Diagnoses: Patient Active Problem List   Diagnosis Date Noted   Hypertension    Pressure injury of skin 03/15/2021   Intracranial hemorrhage ([place]) 03/14/2021   Lower extremity edema    Polymicrobial bacterial infection    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    Enterococcus faecalis infection    Sepsis due to Escherichia coli ([place])    Debility 02/13/2021   Infection of wound hematoma 02/05/2021   Physical debility 12/22/2020   Tear of left hamstring 12/22/2020   Labral tear of left hip joint 12/22/2020   Acute blood loss anemia    Orthostatic hypotension 12/17/2020   History of CVA (cerebrovascular accident) 12/17/2020   Fall 12/15/2020   Hip hematoma, left, initial encounter 12/15/2020   Nondisplaced fracture of coronoid process of left ulna, initial encounter for closed fracture 12/15/2020   Multiple rib fractures 12/15/2020   COVID-19 virus infection 10/12/2020   Constipation 06/22/2020   Change in bowel habits 06/22/2020   Diverticulosis 06/22/2020   Dark stools 06/22/2020   Hemorrhoids 06/22/2020   Pacemaker 01/23/2020   Junctional rhythm 01/23/2020   Palpitations 01/23/2020   ICH (intracerebral hemorrhage) ([place]) 12/02/2019   A-fib ([place]) 12/02/2019    Anemia 12/02/2019   QT prolongation 12/02/2019   Hypothyroidism 12/02/2019   Gait abnormality 07/10/2018   S/P reverse total shoulder arthroplasty, 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   Persistent atrial fibrillation ([place])    Bilateral ureteral calculi 11/17/2017   Atrial fibrillation with RVR ([place]) 09/15/2017   Atrial flutter ([place]) 07/17/2017   Port catheter in place 04/02/2017   Non-Hodgkin lymphoma, unspecified, intrathoracic lymph nodes ([place]) 02/13/2017   Mediastinal mass 02/06/2017   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[place]) 02/06/2017   Abnormal chest x-ray    Lung mass 02/03/2017   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   OSA (obstructive sleep apnea) 02/03/2015   History of renal calculi 11/16/2014   Renal calculi 11/16/2014   Family history of colon cancer 10/26/2014   Mechanical complication due to cardiac pacemaker pulse generator 11/06/2013   Dyspnea 05/12/2013   Hypothyroidism 04/21/2013   Pleural effusion 04/19/2013   Long term (current) use of anticoagulants 12/20/2010   EPIDERMOID CYST 08/22/2010   Chronic diastolic heart failure ([place]) 08/06/2010   SYNCOPE AND COLLAPSE 07/27/2010   OSTEOARTHRITIS, KNEE, RIGHT 03/15/2010   Class 1 obesity due to excess calories with body mass index (BMI) of 33.0 to 33.9 in adult 12/07/2009   NONSPEC ELEVATION OF LEVELS OF TRANSAMINASE/LDH 09/08/2009   BREAST CANCER, HX OF 07/25/2009   COLONIC POLYPS, HX OF 07/25/2009   TUBULOVILLOUS ADENOMA, COLON 04/29/2008   HYPERGLYCEMIA, FASTING 04/29/2008   HYPERLIPIDEMIA 06/22/2007   CARCINOMA, THYROID GLAND, HX OF 06/22/2007    Orientation RESPIRATION BLADDER Height & Weight     Self, Time, Situation, Place  Normal Continent Weight:  195 lb 12.3 oz (88.8 kg) Height:  5\' 6"  (167.6 cm)  BEHAVIORAL SYMPTOMS/MOOD NEUROLOGICAL BOWEL NUTRITION STATUS      Continent Diet (Heart healthy thin liquids)  AMBULATORY STATUS COMMUNICATION OF NEEDS Skin   Limited Assist Verbally PU Stage and Appropriate Care   PU Stage  2 Dressing:  (Santyl daily to wound)                   Personal Care Assistance Level of Assistance  Bathing, Dressing Bathing Assistance: Limited assistance Feeding assistance: Independent Dressing Assistance: Limited assistance     Functional Limitations Info  Speech     Speech Info: Impaired    SPECIAL CARE FACTORS FREQUENCY  PT (By licensed PT), OT (By licensed OT), Speech therapy     PT Frequency: 5x week OT Frequency: 5x week     Speech Therapy Frequency: 5x week      Contractures      Additional Factors Info  Code Status, Allergies, Isolation Precautions Code Status Info: Partial Allergies Info: Ranolazine, Rivaroxaban Tape, Tikosyn ( [place]), Epinephrine, Ketamine     Isolation Precautions Info: ESBL and OMDRO     Current Medications (03/28/2021):  This is the current hospital active medication list Current Facility-Administered Medications  Medication Dose Route Frequency Provider Last Rate Last Admin   acetaminophen (TYLENOL) tablet 650 mg  650 mg Oral Q4H PRN Cathlyn Parsons, PA-C   650 mg at 03/27/21 1635   Or   acetaminophen (TYLENOL) 160 MG/5ML solution 650 mg  650 mg Per Tube Q4H PRN Angiulli, Lavon Paganini, PA-C       Or   acetaminophen (TYLENOL) suppository 650 mg  650 mg Rectal Q4H PRN Angiulli, Lavon Paganini, PA-C       amiodarone (PACERONE) tablet 200 mg  200 mg Oral BID Cathlyn Parsons, PA-C   200 mg at 03/28/21 0900   aspirin EC tablet 81 mg  81 mg Oral Daily Charlett Blake, MD   81 mg at 03/28/21 0900   collagenase (SANTYL) ointment   Topical Daily Cathlyn Parsons, PA-C   Given at 03/27/21 0930   levothyroxine (SYNTHROID) tablet 137 mcg  137 mcg Oral Once per day on Sun Mon Tue Wed Thu Fri Cathlyn Parsons, PA-C   137 mcg at 03/28/21 8657   levothyroxine (SYNTHROID) tablet 68.5 mcg  68.5 mcg Oral Once per day on Sat Cathlyn Parsons, PA-C   68.5 mcg at 03/24/21 0558   midodrine (PROAMATINE) tablet 2.5 mg  2.5 mg Oral BID WC Alger Simons T, MD   2.5 mg at 03/28/21 1121   pantoprazole (PROTONIX) EC tablet 40 mg  40 mg Oral Daily Cathlyn Parsons, PA-C   40 mg at 03/28/21 0900   polyethylene glycol (MIRALAX / GLYCOLAX) packet 17 g  17 g Oral Daily Cathlyn Parsons, PA-C   17 g at 03/28/21 0900   rosuvastatin (CRESTOR) tablet 10 mg  10 mg Oral Daily Meredith Staggers, MD   10 mg at 03/28/21 8469   senna-docusate (Senokot-S) tablet 2 tablet  2 tablet Oral BID Cathlyn Parsons, PA-C   2 tablet at 03/28/21 6295     Discharge Medications: Please see discharge summary for a list of discharge medications.  Relevant Imaging Results:  Relevant Lab Results:   Additional Information SSN: [id number] COVID vaccinated with booster  Tanija Germani, Gardiner Rhyme, LCSW

## 2021-03-29 NOTE — Progress Notes (Signed)
PROGRESS NOTE   Subjective/Complaints: Denies any dizziness or bp problems. Asked me "what is the plan?"  ROS: Patient denies fever, rash, sore throat, blurred vision, nausea, vomiting, diarrhea, cough, shortness of breath or chest pain,  headache, or mood change.    Objective:   No results found. No results for input(s): WBC, HGB, HCT, PLT in the last 72 hours.  No results for input(s): NA, K, CL, CO2, GLUCOSE, BUN, CREATININE, CALCIUM in the last 72 hours.    Intake/Output Summary (Last 24 hours) at 03/29/2021 1201 Last data filed at 03/29/2021 1045 Gross per 24 hour  Intake 240 ml  Output 100 ml  Net 140 ml     Pressure Injury 03/15/21 Thigh Left;Medial Stage 2 -  Partial thickness loss of dermis presenting as a shallow open injury with a red, pink wound bed without slough. (Active)  03/15/21 0017  Location: Thigh  Location Orientation: Left;Medial  Staging: Stage 2 -  Partial thickness loss of dermis presenting as a shallow open injury with a red, pink wound bed without slough.  Wound Description (Comments):   Present on Admission: Yes    Physical Exam: Vital Signs Blood pressure 120/73, pulse (!) 108, temperature 98 F (36.7 C), resp. rate 17, height 5\' 6"  (1.676 m), weight 88.8 kg, SpO2 98 %.   Constitutional: No distress . Vital signs reviewed. HEENT: EOMI, oral membranes moist Neck: supple Cardiovascular: RRR without murmur. No JVD    Respiratory/Chest: CTA Bilaterally without wheezes or rales. Normal effort    GI/Abdomen: BS +, non-tender, non-distended Ext: no clubbing, cyanosis, or edema Psych: pleasant and cooperative  Skin: Left post thigh wound with dressing, fibronecrotic debris, foam. Anterior wound packed, foam dressing Neurologic: oriented to self, stm deficits. Fair insight and awareness. Cranial nerves II through XII intact, motor strength is 4/5 in bilateral deltoid, bicep, tricep, grip, 3-/5  Bilateral hip flexor, knee extensors, ankle dorsiflexor and plantar flexor Sensory exam normal sensation to light touch in bilateral upper and lower extremities Cerebellar- no dysmetria bilateral FNF Musculoskeletal: left thigh sl tender. Tight heel cords    Assessment/Plan: 1. Functional deficits which require 3+ hours per day of interdisciplinary therapy in a comprehensive inpatient rehab setting. Physiatrist is providing close team supervision and 24 hour management of active medical problems listed below. Physiatrist and rehab team continue to assess barriers to discharge/monitor patient progress toward functional and medical goals  Care Tool:  Bathing    Body parts bathed by patient: Right arm, Left arm, Chest, Abdomen, Front perineal area, Buttocks, Right upper leg, Left upper leg, Right lower leg, Left lower leg, Face         Bathing assist Assist Level: Contact Guard/Touching assist     Upper Body Dressing/Undressing Upper body dressing   What is the patient wearing?: Pull over shirt, Bra    Upper body assist Assist Level: Supervision/Verbal cueing    Lower Body Dressing/Undressing Lower body dressing      What is the patient wearing?: Pants     Lower body assist Assist for lower body dressing: Moderate Assistance - Patient 50 - 74%     Toileting Toileting    Toileting assist Assist  for toileting: Minimal Assistance - Patient > 75%     Transfers Chair/bed transfer  Transfers assist     Chair/bed transfer assist level: Contact Guard/Touching assist Chair/bed transfer assistive device: Programmer, multimedia   Ambulation assist      Assist level: Minimal Assistance - Patient > 75% Assistive device: Walker-rolling Max distance: 20'   Walk 10 feet activity   Assist     Assist level: Minimal Assistance - Patient > 75% Assistive device: Walker-rolling   Walk 50 feet activity   Assist Walk 50 feet with 2 turns activity did not  occur: Safety/medical concerns  Assist level: Contact Guard/Touching assist Assistive device: Walker-rolling    Walk 150 feet activity   Assist Walk 150 feet activity did not occur: Safety/medical concerns         Walk 10 feet on uneven surface  activity   Assist Walk 10 feet on uneven surfaces activity did not occur: Safety/medical concerns         Wheelchair     Assist Will patient use wheelchair at discharge?: Yes (Per PT long term goals)   Wheelchair activity did not occur: Safety/medical concerns         Wheelchair 50 feet with 2 turns activity    Assist    Wheelchair 50 feet with 2 turns activity did not occur: Safety/medical concerns       Wheelchair 150 feet activity     Assist  Wheelchair 150 feet activity did not occur: Safety/medical concerns       Blood pressure 120/73, pulse (!) 108, temperature 98 F (36.7 C), resp. rate 17, height 5\' 6"  (1.676 m), weight 88.8 kg, SpO2 98 %.  Medical Problem List and Plan: 1.  Altered mental status secondary to small left caudate head ICH with small IVH secondary hypertension and anticoagulation use             -patient may shower with thigh wounds covered              -ELOS/Goals: 14-16 days, min assist to supervision goals with PT, OT  and mod I to supervision with SLP  --Continue CIR therapies including PT, OT, and SLP, SNF pending   2.  Antithrombotics: -DVT/anticoagulation: SCDs             -antiplatelet therapy:  ASA 81mg  daily cleared by neuro 3. Pain Management: Tylenol as needed 4. Mood: Provide emotional support             -antipsychotic agents: N/A 5. Neuropsych: This patient is not capable of making decisions on her own behalf. 7. Fluids/Electrolytes/Nutrition: Routine in and outs with follow-up chemistries Recheck BMET given issues with orthostasis  8.  Hypertension.  Lisinopril 10 mg daily.  No longer on Lisinopril Monitor with increased mobility Vitals:   03/29/21 0503  03/29/21 0844  BP: 132/80 120/73  Pulse: (!) 102 (!) 108  Resp: 18 17  Temp: 97.8 F (36.6 C) 98 F (36.7 C)  SpO2: 98% 98%   7/7 still some symptoms yesterday with therapy  -continue TEDS, abd binder, midodrine 2.5mg   9.  Atrial fibrillation/pacemaker.  Amiodarone 200 mg twice daily.   Eliquis discontinued due to [place]- now only on ASA 81mg  daily  10.  Hypothyroidism.  Synthroid 11.  Chronic left thigh wound.  Followed by Dr. Lucia Gaskins.  WOC following while in the hospital.  Dressing changes daily and prn.   -Wound VAC discontinued 03/21/2021 -santyl to posterior wound, anterior wound packed 12.  NHL with SVC syndrome/lung cancer radiation therapy as well as chemo.  Followed by Dr.Gudena 13.  Obesity.  Status post lap band surgery.  BMI 32.10.  Dietary follow-up 14.  Acute on chronic anemia.  Follow-up CBC Hbg improved to 10.3  15- constipation on Senna 2 po bid.      LOS: 10 days A FACE TO FACE EVALUATION WAS PERFORMED  Meredith Staggers 03/29/2021, 12:01 PM

## 2021-03-29 NOTE — Progress Notes (Signed)
Speech Language Pathology Daily Session Note  Patient Details  Name: Kayla Harrison MRN: [id number] Date of Birth: [birth year]  Today's Date: 03/29/2021 SLP Individual Time: 1345-1430 SLP Individual Time Calculation (min): 45 min  Short Term Goals: Week 2: SLP Short Term Goal 1 (Week 2): Patient will demonstrate functional problem solving for mildly complex tasks with Min verbal cues. SLP Short Term Goal 2 (Week 2): Patient will recall new, daily information with Mod A multimodal cues for use of compnesatory strategies. SLP Short Term Goal 3 (Week 2): Patient will demosntrate selective attention to tasks in a mildly distracting enviornment for 30 minutes with supervision verbal cues for redirection. SLP Short Term Goal 4 (Week 2): Patient will self-monitor and correct errors during functional tasks with Min A verbal cues.  Skilled Therapeutic Interventions: Skilled treatment session focused on cognitive goals. SLP facilitated session by providing overall Min A verbal and visual cues for problem solving, working memory and error awareness during a complex  medication management task in which patient had to identify medication administration errors within a pillbox. Patient demonstrated sustained attention to task for ~30 minutes with Mod I. Patient left upright in recliner with alarm on and all needs within reach. Continue with current plan of care.      Pain Pain Assessment Pain Scale: 0-10 Pain Score: 0-No pain  Therapy/Group: Individual Therapy  Jakai Risse 03/29/2021, 3:44 PM

## 2021-03-29 NOTE — Progress Notes (Signed)
Occupational Therapy Session Note  Patient Details  Name: Kayla Harrison MRN: [id number] Date of Birth: [birth year]  Today's Date: 03/29/2021 OT Individual Time: 0263-7858 OT Individual Time Calculation (min): 46 min    Short Term Goals: Week 2:  OT Short Term Goal 1 (Week 2): Patient will complete LB dressing min A consistently with LRAD/AE PRN. OT Short Term Goal 2 (Week 2): Patient will complete toilet transfers CGA consistently with LRAD. OT Short Term Goal 3 (Week 2): Patient will complete 3/3 toileting tasks with CGA consistently using LRAD. OT Short Term Goal 4 (Week 2): Patient will tolerate standing balance in preparation for standing level ADLs for at least 1 minute with CGA. OT Short Term Goal 5 (Week 2): Patient will complete standing level grooming task with no overt LOB using LRAD with CGA.  Skilled Therapeutic Interventions/Progress Updates:    Pt received supine, agreeable to OT, reporting no pain and no symptoms of orthostatic vitals this session. Session focused on BADLs for improved independence and safety. Donned TED hose and nonskid socks dependently bed level. Sup>sit close spvsn and increased time. Reported already completing LB bathing prior to session; UB bathing/dressing seated EOB close spvsn/set up A. Abdominal binder donned max A. Sit<>stand with RW CGA. Stand step transfer bed>w/c CGA. Requesting washing hair; washed seated in w/c at sink with shampoo tray. Oral hygiene set up A seated. Sister present throughout majority of session. Pt remained seated in w/c, alarm set, call bell in reach, sister present, all immediate needs met, provided with water.  Therapy Documentation Precautions:  Precautions Precautions: Fall Precaution Comments: watch for orthostatic hypotension, contact precautions Restrictions Weight Bearing Restrictions: No LUE Weight Bearing: Touch down weight bearing LLE Weight Bearing: Weight bearing as tolerated  Pain: Pain Assessment Pain  Scale: 0-10 Pain Score: 0-No pain    Therapy/Group: Individual Therapy  Mellissa Kohut 03/29/2021, 8:22 AM

## 2021-03-29 NOTE — Progress Notes (Signed)
Speech Language Pathology Daily Session Note  Patient Details  Name: Kayla Harrison MRN: [id number] Date of Birth: [birth year]  Today's Date: 03/29/2021 SLP Individual Time: 0800-0830 SLP Individual Time Calculation (min): 30 min  Short Term Goals: Week 2: SLP Short Term Goal 1 (Week 2): Patient will demonstrate functional problem solving for mildly complex tasks with Min verbal cues. SLP Short Term Goal 2 (Week 2): Patient will recall new, daily information with Mod A multimodal cues for use of compnesatory strategies. SLP Short Term Goal 3 (Week 2): Patient will demosntrate selective attention to tasks in a mildly distracting enviornment for 30 minutes with supervision verbal cues for redirection. SLP Short Term Goal 4 (Week 2): Patient will self-monitor and correct errors during functional tasks with Min A verbal cues.  Skilled Therapeutic Interventions: Skilled ST intervention performed with focus on cognitive goals. SLP facilitated mildly complex verbal reasoning task involving generating words within various categories based on selected initial letter (e.g., what is an activity that starts with the letter "L", item found within a home that starts with the letter "M"). Patient initially completed task with Mod A verbal cues for selective attention within mildly distracting environment (environmental services cleaning bathroom during session) for duration of 10 minutes and stated she was experiencing increased difficulty with attending to task amongst external stimuli, and requested to complete remainder of session (20 mins) in quiet environment for improved selective attention in which she completed task with sup-to-min A verbal cues to alternate attention between one category to the next. Patient was left in bed with alarm activated and needs within reach. Nurse arrived at end of session. Continue per ST POC.    Pain Pain Assessment Pain Scale: 0-10 Pain Score: 0-No pain  Therapy/Group:  Individual Therapy  Patty Sermons 03/29/2021, 12:13 PM

## 2021-03-29 NOTE — Progress Notes (Signed)
Physical Therapy Session Note  Patient Details  Name: Kayla Harrison MRN: [id number] Date of Birth: 02-Nov-1945  Today's Date: 03/29/2021 PT Individual Time: 1110-1210 PT Individual Time Calculation (min): 60 min   Short Term Goals: Week 2:  PT Short Term Goal 1 (Week 2): STG=LTG due to ELOS  Skilled Therapeutic Interventions/Progress Updates:    Patient up in w/c and reports feeling well today.  States got hair washed.  Pushed in w/c to therapy gym.  Patient sit to stand to RW with CGA throughout session and occasional cues for hand placement.  Patient ambulated 39' x 2 with RW and CGA.  Patient began to c/o R knee pain finishing second walk.    Seated for therex including LAQ w/ 4# on R 5# on L, performed standing hip abduction with UE support on table with min A.  Standing at wall rail in hallway pt performed side stepping, then forward and backward walk with HHS on L and rail on R.  Performed standing balance static without UE support x 30 sec, then got dizzy needing to sit.  Patient negotiated up curb step with RW and mod A then c/o dizziness so chair brought close to pt and assisted to sit from step.    Seated EOM for vestibulo/ocular assessment.  Noted intact smooth pursuits and saccades, though pt reporting blurriness in vision.  Patient demonstrates R eye lower in orbit and though cover/uncover test negative she does feel different/better with tilting head to R.  Educated on ocular tilt function and possible areas affected along pathway.  Patient performed lateral head tilt and cues for slowing down to allow for accomodation.    Patient assisted in chair to her room and ambulated into bathroom. Able to complete hygiene on her own, but assisted to change pants as soiled.  Washed hands at sink with CGA and seated to rest on EOB.  Ambulated around bed to recliner with RW and CGA.  Left seated with seat belt alarm and friend in the room, needs and call bell in reach.  Therapy  Documentation Precautions:  Precautions Precautions: Fall Precaution Comments: watch for orthostatic hypotension, contact precautions Restrictions Weight Bearing Restrictions: No LUE Weight Bearing: Weight bearing as tolerated LLE Weight Bearing: Weight bearing as tolerated  Pain: Pain Assessment Pain Score: 0-No pain   Therapy/Group: Individual Therapy  Reginia Naas Magda Kiel, PT 03/29/2021, 4:45 PM

## 2021-03-30 LAB — URINALYSIS, COMPLETE (UACMP) WITH MICROSCOPIC
Bilirubin Urine: NEGATIVE
Glucose, UA: NEGATIVE mg/dL
Ketones, ur: NEGATIVE mg/dL
Nitrite: NEGATIVE
Protein, ur: 30 mg/dL — AB
Specific Gravity, Urine: 1.006 (ref 1.005–1.030)
WBC, UA: >50 WBC/hpf — ABNORMAL HIGH (ref 0–5)
pH: 6 (ref 5.0–8.0)

## 2021-03-30 MED ORDER — MIDODRINE HCL 5 MG PO TABS
5.0000 mg | ORAL_TABLET | Freq: Two times a day (BID) | ORAL | Status: DC
  Administered 2021-03-31 – 2021-04-03 (×8): 5 mg via ORAL
  Filled 2021-03-30 (×8): qty 1

## 2021-03-30 NOTE — Progress Notes (Signed)
Patient ID: Kayla Harrison, female   DOB: 05-15-1946, 75 y.o.   MRN: [id number] Faxed over letter to lawyers office regarding pt's inability to make decisions and handle affairs at this time, per request of [name] who is her POA. Aware of bed search awaiting bed offer.

## 2021-03-30 NOTE — Progress Notes (Signed)
Physical Therapy Session Note  Patient Details  Name: Kayla Harrison MRN: [id number] Date of Birth: Dec 17, 1945  Today's Date: 03/30/2021 PT Individual Time: 1500-1600 PT Individual Time Calculation (min): 60 min   Short Term Goals: Week 2:  PT Short Term Goal 1 (Week 2): STG=LTG due to ELOS  Skilled Therapeutic Interventions/Progress Updates:    Patient in supine and denies issues.  Donned THT in supine total A.  Performed supine to sit with S and donned binder EOB mod A.  Patient sit to stand with CGA from EOB with posterior LOB pt reaching to sink to correct.  Patient ambulated to bathroom with RW and CGA increased time to negotiate threshold into bathroom.  CGA for balance during clothing management.  Patient washed hands at sink with CGA.  In w/c assisted to therapy gym.    Patient questioning why she isn't progressing so discussed progress after stroke versus her previous visits.  Also discussed PT goals of supervision due to cognitive deficits.  She worried she won't be able to afford care so discussed several options and that she needs to focus on her rehab now and not fret over down the road for now.    She performed sit to stand to RW and ambulated 147' with RW and CGA without episodes of R knee buckling.  In parallel bars performed side stepping and hip extension x 5 on each side.  Initiated c/o R knee pain.  Seated EOM for hamstring curls and LAQ with green t-band x 10 each leg.  Sit<>stand from elevated mat no RW min A to work on balance x 3 reps.  Standing head turns to look at 2 targets x 5 and pt c/o dizziness.  Standing visual focus ahead for balance and to limit dizziness.  Patient ambulated x 22' with RW and w/c follow towards room.  Assisted in w/c to room and patient encouraged up in chair for dinner.  Ambulated around bed with RW and CGA to recliner.  Left up with call bell and needs in reach and seat belt alarm activated.  Removed binder for comfort.   Therapy  Documentation Precautions:  Precautions Precautions: Fall Precaution Comments: watch for orthostatic hypotension, contact precautions Restrictions Weight Bearing Restrictions: No LUE Weight Bearing: Weight bearing as tolerated LLE Weight Bearing: Weight bearing as tolerated  Pain: Pain Assessment Faces Pain Scale: Hurts little more Pain Type: Acute pain Pain Location: Knee Pain Orientation: Right Pain Descriptors / Indicators: Aching Pain Onset: With Activity Pain Intervention(s): Rest    Therapy/Group: Individual Therapy  [name], [place] 03/30/2021, 6:01 PM

## 2021-03-30 NOTE — Progress Notes (Signed)
Occupational Therapy Session Note  Patient Details  Name: Kayla Harrison MRN: [id number] Date of Birth: 1946/05/24  Today's Date: 03/30/2021 OT Individual Time: 1020-1108 OT Individual Time Calculation (min): 48 min    Short Term Goals: Week 2:  OT Short Term Goal 1 (Week 2): Patient will complete LB dressing min A consistently with LRAD/AE PRN. OT Short Term Goal 2 (Week 2): Patient will complete toilet transfers CGA consistently with LRAD. OT Short Term Goal 3 (Week 2): Patient will complete 3/3 toileting tasks with CGA consistently using LRAD. OT Short Term Goal 4 (Week 2): Patient will tolerate standing balance in preparation for standing level ADLs for at least 1 minute with CGA. OT Short Term Goal 5 (Week 2): Patient will complete standing level grooming task with no overt LOB using LRAD with CGA.  Skilled Therapeutic Interventions/Progress Updates:    Pt received with NT in room ambulating to bathroom without TED hose or abdominal binder donned, agreeable to OT, reporting no pain or dizziness. OT took over pt care as pt ambulated with RW bathroom>bed CGA. RN approved wearing abdominal binder without thigh high TED hose as trial; doffed socks EOB max A for time. LB bathing EOB close spvsn. Pt donned socks close spvsn EOB, reaching down to ground and using figure 4 technique without c/o dizziness. LB dressing EOB seated and standing with RW CGA. Pt became dizzy upon standing trial pulling pants over hips and during perihygiene/washing buttocks. Vitals taken (see below); symptoms decreased after seated rest break. Stand step transfer bed>w/c CGA. UB bathing/dressing and oral hygiene seated in w/c at sink close spvsn-set up A. Abdominal binder donned. Stand step transfer w/c>recliner CGA with RW. Pt remained seated in recliner, alarm set, call bell in reach, and all immediate needs met. OT missed 12 minutes due to previous pt care; will make up as time allows.  Therapy  Documentation Precautions:  Precautions Precautions: Fall Precaution Comments: watch for orthostatic hypotension, contact precautions Restrictions Weight Bearing Restrictions: No LUE Weight Bearing: Weight bearing as tolerated LLE Weight Bearing: Weight bearing as tolerated General: General OT Amount of Missed Time: 12 Minutes  Vital Signs: BP seated EOB after standing trials: 113/78 (no abdominal binder or TED hose due to bathing/dressing)  Pain: Pain Assessment Pain Scale: 0-10 Pain Score: 0-No pain    Therapy/Group: Individual Therapy  Mellissa Kohut 03/30/2021, 7:37 AM

## 2021-03-30 NOTE — Progress Notes (Addendum)
PROGRESS NOTE   Subjective/Complaints: No new issues. Pt still unclear about her disposition. Wants to go home. Reported dysuria this afternoon  ROS: Patient denies fever, rash, sore throat, blurred vision, nausea, vomiting, diarrhea, cough, shortness of breath or chest pain, joint or back pain, headache, or mood change. .    Objective:   No results found. No results for input(s): WBC, HGB, HCT, PLT in the last 72 hours.  No results for input(s): NA, K, CL, CO2, GLUCOSE, BUN, CREATININE, CALCIUM in the last 72 hours.    Intake/Output Summary (Last 24 hours) at 03/30/2021 1415 Last data filed at 03/30/2021 1300 Gross per 24 hour  Intake 720 ml  Output 200 ml  Net 520 ml     Pressure Injury 03/15/21 Thigh Left;Medial Stage 2 -  Partial thickness loss of dermis presenting as a shallow open injury with a red, pink wound bed without slough. (Active)  03/15/21 0017  Location: Thigh  Location Orientation: Left;Medial  Staging: Stage 2 -  Partial thickness loss of dermis presenting as a shallow open injury with a red, pink wound bed without slough.  Wound Description (Comments):   Present on Admission: Yes    Physical Exam: Vital Signs Blood pressure 106/82, pulse 95, temperature 97.6 F (36.4 C), resp. rate 18, height 5\' 6"  (1.676 m), weight 88.8 kg, SpO2 96 %.   Constitutional: No distress . Vital signs reviewed. HEENT: EOMI, oral membranes moist Neck: supple Cardiovascular: RRR without murmur. No JVD    Respiratory/Chest: CTA Bilaterally without wheezes or rales. Normal effort    GI/Abdomen: BS +, non-tender, non-distended Ext: no clubbing, cyanosis, or edema Psych: pleasant and cooperative  Skin: Left post thigh wound with dressing, fibronecrotic debris, foam. Anterior wound packed, foam dressing--both stable in appearance Neurologic: oriented to self, place, reason she's here. STM deficits. Normal language. Follows  commands. Fair insight and awareness. Cranial nerves II through XII intact, motor strength is 4/5 in bilateral deltoid, bicep, tricep, grip, 3-/5 Bilateral hip flexor, knee extensors, ankle dorsiflexor and plantar flexor Sensory exam normal sensation to light touch in bilateral upper and lower extremities Cerebellar- no dysmetria bilateral FNF Musculoskeletal: tight heel cords, left thigh minimally tender now    Assessment/Plan: 1. Functional deficits which require 3+ hours per day of interdisciplinary therapy in a comprehensive inpatient rehab setting. Physiatrist is providing close team supervision and 24 hour management of active medical problems listed below. Physiatrist and rehab team continue to assess barriers to discharge/monitor patient progress toward functional and medical goals  Care Tool:  Bathing    Body parts bathed by patient: Right arm, Left arm, Chest, Abdomen, Face         Bathing assist Assist Level: Supervision/Verbal cueing     Upper Body Dressing/Undressing Upper body dressing   What is the patient wearing?: Pull over shirt, Bra    Upper body assist Assist Level: Supervision/Verbal cueing    Lower Body Dressing/Undressing Lower body dressing      What is the patient wearing?: Pants     Lower body assist Assist for lower body dressing: Moderate Assistance - Patient 50 - 74%     Toileting Toileting  Toileting assist Assist for toileting: Minimal Assistance - Patient > 75%     Transfers Chair/bed transfer  Transfers assist     Chair/bed transfer assist level: Contact Guard/Touching assist Chair/bed transfer assistive device: Programmer, multimedia   Ambulation assist      Assist level: Minimal Assistance - Patient > 75% Assistive device: Walker-rolling Max distance: 20'   Walk 10 feet activity   Assist     Assist level: Minimal Assistance - Patient > 75% Assistive device: Walker-rolling   Walk 50 feet  activity   Assist Walk 50 feet with 2 turns activity did not occur: Safety/medical concerns  Assist level: Contact Guard/Touching assist Assistive device: Walker-rolling    Walk 150 feet activity   Assist Walk 150 feet activity did not occur: Safety/medical concerns         Walk 10 feet on uneven surface  activity   Assist Walk 10 feet on uneven surfaces activity did not occur: Safety/medical concerns         Wheelchair     Assist Will patient use wheelchair at discharge?: Yes (Per PT long term goals)   Wheelchair activity did not occur: Safety/medical concerns         Wheelchair 50 feet with 2 turns activity    Assist    Wheelchair 50 feet with 2 turns activity did not occur: Safety/medical concerns       Wheelchair 150 feet activity     Assist  Wheelchair 150 feet activity did not occur: Safety/medical concerns       Blood pressure 106/82, pulse 95, temperature 97.6 F (36.4 C), resp. rate 18, height 5\' 6"  (1.676 m), weight 88.8 kg, SpO2 96 %.  Medical Problem List and Plan: 1.  Altered mental status secondary to small left caudate head ICH with small IVH secondary hypertension and anticoagulation use             -patient may shower with thigh wounds covered              -ELOS/Goals: 14-16 days, min assist to supervision goals with PT, OT  and mod I to supervision with SLP  -Continue CIR therapies including PT, OT, and SLP, SNF pending  2.  Antithrombotics: -DVT/anticoagulation: SCDs             -antiplatelet therapy:  ASA 81mg  daily cleared by neuro 3. Pain Management: Tylenol as needed 4. Mood: Provide emotional support             -antipsychotic agents: N/A 5. Neuropsych: This patient is not capable of making decisions on her own behalf. 7. Fluids/Electrolytes/Nutrition: Routine in and outs with follow-up chemistries Recheck BMET given issues with orthostasis  8.  Hypertension.  Lisinopril 10 mg daily.  No longer on Lisinopril  Monitor with increased mobility Vitals:   03/29/21 2017 03/30/21 0500  BP: 117/74 106/82  Pulse: 99 95  Resp: 20 18  Temp: (!) 97.3 F (36.3 C) 97.6 F (36.4 C)  SpO2: 98% 96%   7/8 still some dizziness with therapy  -continue TEDS, abd binder Increase midodrine to 5mg   9.  Atrial fibrillation/pacemaker.  Amiodarone 200 mg twice daily.   Eliquis discontinued due to [place]- now only on ASA 81mg  daily  10.  Hypothyroidism.  Synthroid 11.  Chronic left thigh wound.  Followed by Dr. Lucia Gaskins.  WOC following while in the hospital.  Dressing changes daily and prn.   -Wound VAC discontinued 03/21/2021 -santyl to posterior wound, anterior wound still  packed 12.  NHL with SVC syndrome/lung cancer radiation therapy as well as chemo.  Followed by Dr.Gudena 13.  Obesity.  Status post lap band surgery.  BMI 32.10.  Dietary follow-up 14.  Acute on chronic anemia.  Follow-up CBC Hbg improved to 10.3 15- constipation on Senna 2 po bid.   16. Dysuria: check UA/UCX 7/8    LOS: 11 days A FACE TO FACE EVALUATION WAS PERFORMED  Meredith Staggers 03/30/2021, 2:15 PM

## 2021-03-31 MED ORDER — FOSFOMYCIN TROMETHAMINE 3 G PO PACK
3.0000 g | PACK | Freq: Once | ORAL | Status: AC
  Administered 2021-03-31: 3 g via ORAL
  Filled 2021-03-31: qty 3

## 2021-03-31 NOTE — Progress Notes (Signed)
Speech Language Pathology Daily Session Note  Patient Details  Name: [name] MRN: [id number] Date of Birth: [birth year]  Today's Date: 03/31/2021 SLP Individual Time: 1100-1205 SLP Individual Time Calculation (min): 65 min  Short Term Goals: Week 2: SLP Short Term Goal 1 (Week 2): Patient will demonstrate functional problem solving for mildly complex tasks with Min verbal cues. SLP Short Term Goal 2 (Week 2): Patient will recall new, daily information with Mod A multimodal cues for use of compnesatory strategies. SLP Short Term Goal 3 (Week 2): Patient will demosntrate selective attention to tasks in a mildly distracting enviornment for 30 minutes with supervision verbal cues for redirection. SLP Short Term Goal 4 (Week 2): Patient will self-monitor and correct errors during functional tasks with Min A verbal cues.  Skilled Therapeutic Interventions:   Pt was seen in room for therapy. Pt was pleasant and cooperative throughout today's tx session. Jacqlyn Krauss, was in room for tx at request of patient. Pt completed medication management task. SLP wrote 3 prescriptions down (1 tablet once a day, 1 tablet twice a day, and 2 tablets twice a day) along with the corresponding color of bead. Pt was able to organize (2 container) pill box with limited assistance from SLP. SLP asked pt if she was to take a pill "once a day" would she take it in the morning or evening, pt reported in the AM. Pill box showed she would have taken it in PM. Pt agreed that this was in error. Pt was unable to recall medicines independently, requiring SLP to pull from medication list. Pt demonstrated anosognosia of deficits. SLP introduced WRAP strategies and assisted pt with writing in her journal to increase recall of days' events.  Pt was left sitting in chair, with legs up and chair alarm on. [name] remained in room. Continue with current POC.   Pain    Therapy/Group: Individual Therapy  Verdene Lennert MS,  CCC-SLP, CBIS  03/31/2021, 2:02 PM

## 2021-03-31 NOTE — Progress Notes (Signed)
PROGRESS NOTE   Subjective/Complaints: UA+, UC pending. Will do 1x fosfomycin since this worked well for her last UTI Complains of constipation. Does not want any additional laxatives  ROS: Patient denies fever, rash, sore throat, blurred vision, nausea, vomiting, diarrhea, cough, shortness of breath or chest pain, joint or back pain, headache, or mood change. +urinary frequency   Objective:   No results found. No results for input(s): WBC, HGB, HCT, PLT in the last 72 hours.  No results for input(s): NA, K, CL, CO2, GLUCOSE, BUN, CREATININE, CALCIUM in the last 72 hours.    Intake/Output Summary (Last 24 hours) at 03/31/2021 1605 Last data filed at 03/31/2021 1310 Gross per 24 hour  Intake 802 ml  Output 125 ml  Net 677 ml     Pressure Injury 03/15/21 Thigh Left;Medial Stage 2 -  Partial thickness loss of dermis presenting as a shallow open injury with a red, pink wound bed without slough. (Active)  03/15/21 0017  Location: Thigh  Location Orientation: Left;Medial  Staging: Stage 2 -  Partial thickness loss of dermis presenting as a shallow open injury with a red, pink wound bed without slough.  Wound Description (Comments):   Present on Admission: Yes    Physical Exam: Vital Signs Blood pressure 108/73, pulse (!) 105, temperature 97.6 F (36.4 C), resp. rate 17, height 5\' 6"  (1.676 m), weight 88.8 kg, SpO2 99 %.   Constitutional: No distress . Vital signs reviewed. HEENT: EOMI, oral membranes moist Neck: supple Cardiovascular: tachycardic Respiratory/Chest: CTA Bilaterally without wheezes or rales. Normal effort    GI/Abdomen: BS +, non-tender, non-distended Ext: no clubbing, cyanosis, or edema Psych: pleasant and cooperative  Skin: Left post thigh wound with dressing, fibronecrotic debris, foam. Anterior wound packed, foam dressing--both stable in appearance Neurologic: oriented to self, place, reason she's  here. STM deficits. Normal language. Follows commands. Fair insight and awareness. Cranial nerves II through XII intact, motor strength is 4/5 in bilateral deltoid, bicep, tricep, grip, 3-/5 Bilateral hip flexor, knee extensors, ankle dorsiflexor and plantar flexor Sensory exam normal sensation to light touch in bilateral upper and lower extremities Cerebellar- no dysmetria bilateral FNF Musculoskeletal: tight heel cords, left thigh minimally tender now    Assessment/Plan: 1. Functional deficits which require 3+ hours per day of interdisciplinary therapy in a comprehensive inpatient rehab setting. Physiatrist is providing close team supervision and 24 hour management of active medical problems listed below. Physiatrist and rehab team continue to assess barriers to discharge/monitor patient progress toward functional and medical goals  Care Tool:  Bathing    Body parts bathed by patient: Right arm, Left arm, Chest, Abdomen, Face, Front perineal area, Buttocks, Right upper leg, Left upper leg, Right lower leg, Left lower leg         Bathing assist Assist Level: Contact Guard/Touching assist     Upper Body Dressing/Undressing Upper body dressing   What is the patient wearing?: Pull over shirt, Bra    Upper body assist Assist Level: Supervision/Verbal cueing    Lower Body Dressing/Undressing Lower body dressing      What is the patient wearing?: Pants     Lower body assist Assist for lower  body dressing: Minimal Assistance - Patient > 75%     Toileting Toileting    Toileting assist Assist for toileting: Minimal Assistance - Patient > 75%     Transfers Chair/bed transfer  Transfers assist     Chair/bed transfer assist level: Contact Guard/Touching assist Chair/bed transfer assistive device: Programmer, multimedia   Ambulation assist      Assist level: Contact Guard/Touching assist Assistive device: Walker-rolling Max distance: 147'   Walk 10 feet  activity   Assist     Assist level: Contact Guard/Touching assist Assistive device: Walker-rolling   Walk 50 feet activity   Assist Walk 50 feet with 2 turns activity did not occur: Safety/medical concerns  Assist level: Contact Guard/Touching assist Assistive device: Walker-rolling    Walk 150 feet activity   Assist Walk 150 feet activity did not occur: Safety/medical concerns         Walk 10 feet on uneven surface  activity   Assist Walk 10 feet on uneven surfaces activity did not occur: Safety/medical concerns         Wheelchair     Assist Will patient use wheelchair at discharge?: Yes (Per PT long term goals)   Wheelchair activity did not occur: Safety/medical concerns         Wheelchair 50 feet with 2 turns activity    Assist    Wheelchair 50 feet with 2 turns activity did not occur: Safety/medical concerns       Wheelchair 150 feet activity     Assist  Wheelchair 150 feet activity did not occur: Safety/medical concerns       Blood pressure 108/73, pulse (!) 105, temperature 97.6 F (36.4 C), resp. rate 17, height 5\' 6"  (1.676 m), weight 88.8 kg, SpO2 99 %.  Medical Problem List and Plan: 1.  Altered mental status secondary to small left caudate head ICH with small IVH secondary hypertension and anticoagulation use             -patient may shower with thigh wounds covered              -ELOS/Goals: 14-16 days, min assist to supervision goals with PT, OT  and mod I to supervision with SLP  -Continue CIR therapies including PT, OT, and SLP, SNF pending  2.  Impaired mobility: continue SCDs             -antiplatelet therapy:  ASA 81mg  daily cleared by neuro 3. Pain Management: Tylenol as needed 4. Mood: Provide emotional support             -antipsychotic agents: N/A 5. Neuropsych: This patient is not capable of making decisions on her own behalf. 7. Fluids/Electrolytes/Nutrition: Routine in and outs with follow-up  chemistries Recheck BMET given issues with orthostasis  8.  Hypertension.  Lisinopril 10 mg daily.  No longer on Lisinopril Monitor with increased mobility Vitals:   03/31/21 0525 03/31/21 1258  BP: 117/83 108/73  Pulse: (!) 102 (!) 105  Resp: 17 17  Temp: 98.2 F (36.8 C) 97.6 F (36.4 C)  SpO2: 97% 99%   7/8 still some dizziness with therapy  -continue TEDS, abd binder Increase midodrine to 5mg  9.  Atrial fibrillation/pacemaker.  Amiodarone 200 mg twice daily.   Eliquis discontinued due to [place]- now only on ASA 81mg  daily  10.  Hypothyroidism.  Synthroid 11.  Chronic left thigh wound.  Followed by Dr. Lucia Gaskins.  WOC following while in the hospital.  Dressing changes daily and prn.   -  Wound VAC discontinued 03/21/2021 -santyl to posterior wound, anterior wound still packed 12.  NHL with SVC syndrome/lung cancer radiation therapy as well as chemo.  Followed by Dr.Gudena 13.  Obesity.  Status post lap band surgery.  BMI 32.10.  Dietary follow-up 14.  Acute on chronic anemia.  Follow-up CBC Hbg improved to 10.3 15- constipation on Senna 2 po bid.  Edited diet order to include fiber rich foods, apples with meals as she likes these 16. Dysuria: UA+, UC pending: ordered fosfomycin x1 since her last UTI responded well to this.     LOS: 12 days A FACE TO FACE EVALUATION WAS PERFORMED  Kayla Harrison 03/31/2021, 4:05 PM

## 2021-03-31 NOTE — Plan of Care (Signed)
  Problem: Consults Goal: RH STROKE PATIENT EDUCATION Description: See Patient Education module for education specifics  Outcome: Progressing   Problem: RH BOWEL ELIMINATION Goal: RH STG MANAGE BOWEL WITH ASSISTANCE Description: STG Manage Bowel with  mod I  Assistance. Outcome: Progressing Goal: RH STG MANAGE BOWEL W/MEDICATION W/ASSISTANCE Description: STG Manage Bowel with Medication with mod I Assistance. Outcome: Progressing   Problem: RH BLADDER ELIMINATION Goal: RH STG MANAGE BLADDER WITH ASSISTANCE Description: STG Manage Bladder Without Assistance Outcome: Progressing   Problem: RH SKIN INTEGRITY Goal: RH STG MAINTAIN SKIN INTEGRITY WITH ASSISTANCE Description: STG Maintain Skin Integrity With  min Assistance. Outcome: Progressing Goal: RH STG ABLE TO PERFORM INCISION/WOUND CARE W/ASSISTANCE Description: STG Able To Perform Incision/Wound Care With min Assistance. Outcome: Progressing   Problem: RH SAFETY Goal: RH STG ADHERE TO SAFETY PRECAUTIONS W/ASSISTANCE/DEVICE Description: STG Adhere to Safety Precautions With  cues/reminders assistance/Device. Outcome: Progressing   Problem: RH PAIN MANAGEMENT Goal: RH STG PAIN MANAGED AT OR BELOW PT'S PAIN GOAL Description: At or below level 4 Outcome: Progressing   Problem: RH KNOWLEDGE DEFICIT Goal: RH STG INCREASE KNOWLEDGE OF HYPERTENSION Description: Patient will be able to manage HTN with medications and dietary modifications using handouts and educational tools independently Outcome: Progressing Goal: RH STG INCREASE KNOWLEGDE OF HYPERLIPIDEMIA Description: Patient will be able to manage HLD with medications and dietary modifications using handouts and educational tools independently Outcome: Progressing

## 2021-04-01 NOTE — Plan of Care (Signed)
  Problem: Consults Goal: RH STROKE PATIENT EDUCATION Description: See Patient Education module for education specifics  Outcome: Progressing   Problem: RH BOWEL ELIMINATION Goal: RH STG MANAGE BOWEL WITH ASSISTANCE Description: STG Manage Bowel with  mod I  Assistance. Outcome: Progressing Goal: RH STG MANAGE BOWEL W/MEDICATION W/ASSISTANCE Description: STG Manage Bowel with Medication with mod I Assistance. Outcome: Progressing   Problem: RH BLADDER ELIMINATION Goal: RH STG MANAGE BLADDER WITH ASSISTANCE Description: STG Manage Bladder Without Assistance Outcome: Progressing   Problem: RH SKIN INTEGRITY Goal: RH STG MAINTAIN SKIN INTEGRITY WITH ASSISTANCE Description: STG Maintain Skin Integrity With  min Assistance. Outcome: Progressing Goal: RH STG ABLE TO PERFORM INCISION/WOUND CARE W/ASSISTANCE Description: STG Able To Perform Incision/Wound Care With min Assistance. Outcome: Progressing   Problem: RH SAFETY Goal: RH STG ADHERE TO SAFETY PRECAUTIONS W/ASSISTANCE/DEVICE Description: STG Adhere to Safety Precautions With  cues/reminders assistance/Device. Outcome: Progressing   Problem: RH PAIN MANAGEMENT Goal: RH STG PAIN MANAGED AT OR BELOW PT'S PAIN GOAL Description: At or below level 4 Outcome: Progressing   Problem: RH KNOWLEDGE DEFICIT Goal: RH STG INCREASE KNOWLEDGE OF HYPERTENSION Description: Patient will be able to manage HTN with medications and dietary modifications using handouts and educational tools independently Outcome: Progressing Goal: RH STG INCREASE KNOWLEGDE OF HYPERLIPIDEMIA Description: Patient will be able to manage HLD with medications and dietary modifications using handouts and educational tools independently Outcome: Progressing

## 2021-04-01 NOTE — Progress Notes (Addendum)
Occupational Therapy Session Note  Patient Details  Name: Kayla Harrison MRN: [id number] Date of Birth: 08/06/1946  Today's Date: 04/01/2021 OT Individual Time: 0905-1001 OT Individual Time Calculation (min): 56 min    Short Term Goals: Week 2:  OT Short Term Goal 1 (Week 2): Patient will complete LB dressing min A consistently with LRAD/AE PRN. OT Short Term Goal 2 (Week 2): Patient will complete toilet transfers CGA consistently with LRAD. OT Short Term Goal 3 (Week 2): Patient will complete 3/3 toileting tasks with CGA consistently using LRAD. OT Short Term Goal 4 (Week 2): Patient will tolerate standing balance in preparation for standing level ADLs for at least 1 minute with CGA. OT Short Term Goal 5 (Week 2): Patient will complete standing level grooming task with no overt LOB using LRAD with CGA.  Skilled Therapeutic Interventions/Progress Updates:    Pt received supine and asleep in bed, easily aroused with voice, agreeable to OT and reporting no pain/sleeping better last night. Session focused on BADLs and standing tolerance/balance in preparation for improved independence in functional mobility and BADLs. Sup>sit with HOB elevated close spvsn. Abdominal binder donned max A. Min A to don/doff gown. Sit<>stand CGA with RW; pt reported pain in R knee but did not interfere significantly with session. Ambulated with RW bed>bathroom>w/c CGA. Completed 3/3 toileting with overall mod A for balance and clothing management; one slight LOB noted when donning pants and incontinence brief in stance with RW; pt continent of bladder. Completed grooming tasks set up A seated in w/c at sink. UB dressing close spvsn. LB dressing mod A to thread pants due to SOB during anterior leaning to feet. Wheeled to therapy gym due to time constraints. Completed standing level table top activity to improve standing balance/tolerance and endurance; 2 trials - 30 seconds first trial and 2 minutes second trial; while  completing puzzle with no cues for visual perceptual abilities. Pt reported feeling dizzy requiring 2 seated rest breaks; reported vision changes since CVA, reporting R eye blurrier than L, especially when looking far away. Completed oculomotor visual assessment with noted slight decreased speed in R eye during convergence/divergence; encouraged oculomotor tasks to improve skills and muscle strength in R eye. Pt was wheeled back to room; completed stand step transfer with RW w/c>recliner CGA. Pt remained seated in recliner, alarm set, all immediate needs met, call bell in hand.  Therapy Documentation Precautions:  Precautions Precautions: Fall Precaution Comments: watch for orthostatic hypotension, contact precautions Restrictions Weight Bearing Restrictions: No LUE Weight Bearing: Weight bearing as tolerated LLE Weight Bearing: Weight bearing as tolerated  Pain: Pain Assessment Pain Scale: 0-10 Pain Score:  (unrated) Pain Type: Acute pain Pain Location: Knee Pain Orientation: Right Pain Descriptors / Indicators: Aching;Sore Pain Onset: With Activity Pain Intervention(s): Repositioned;Rest;Distraction Multiple Pain Sites: No   Therapy/Group: Individual Therapy   N  04/01/2021, 9:32 AM 

## 2021-04-01 NOTE — Progress Notes (Signed)
PROGRESS NOTE   Subjective/Complaints: Sleepy this AM.  Checked with Caryl Pina RN if urinary frequency has improved and she says it has. UC back with >100,000 E coli, susceptibilities pending.   ROS: Patient denies fever, rash, sore throat, blurred vision, nausea, vomiting, diarrhea, cough, shortness of breath or chest pain, joint or back pain, headache, or mood change. +urinary frequency- improved as per RN   Objective:   No results found. No results for input(s): WBC, HGB, HCT, PLT in the last 72 hours.  No results for input(s): NA, K, CL, CO2, GLUCOSE, BUN, CREATININE, CALCIUM in the last 72 hours.    Intake/Output Summary (Last 24 hours) at 04/01/2021 1641 Last data filed at 04/01/2021 1022 Gross per 24 hour  Intake 684 ml  Output --  Net 684 ml     Pressure Injury 03/15/21 Thigh Left;Medial Stage 2 -  Partial thickness loss of dermis presenting as a shallow open injury with a red, pink wound bed without slough. (Active)  03/15/21 0017  Location: Thigh  Location Orientation: Left;Medial  Staging: Stage 2 -  Partial thickness loss of dermis presenting as a shallow open injury with a red, pink wound bed without slough.  Wound Description (Comments):   Present on Admission: Yes    Physical Exam: Vital Signs Blood pressure 110/72, pulse 91, temperature 97.6 F (36.4 C), resp. rate 18, height 5\' 6"  (1.676 m), weight 88.8 kg, SpO2 100 %. Gen: no distress, normal appearing HEENT: oral mucosa pink and moist, NCAT Cardio: Reg rate Chest: normal effort, normal rate of breathing Abd: soft, non-distended Ext: no edema Psych: pleasant and cooperative  Skin: Left post thigh wound with dressing, fibronecrotic debris, foam. Anterior wound packed, foam dressing--both stable in appearance Neurologic: oriented to self, place, reason she's here. STM deficits. Normal language. Follows commands. Fair insight and awareness. Cranial  nerves II through XII intact, motor strength is 4/5 in bilateral deltoid, bicep, tricep, grip, 3-/5 Bilateral hip flexor, knee extensors, ankle dorsiflexor and plantar flexor Sensory exam normal sensation to light touch in bilateral upper and lower extremities Cerebellar- no dysmetria bilateral FNF Musculoskeletal: tight heel cords, left thigh minimally tender now    Assessment/Plan: 1. Functional deficits which require 3+ hours per day of interdisciplinary therapy in a comprehensive inpatient rehab setting. Physiatrist is providing close team supervision and 24 hour management of active medical problems listed below. Physiatrist and rehab team continue to assess barriers to discharge/monitor patient progress toward functional and medical goals  Care Tool:  Bathing    Body parts bathed by patient: Right arm, Left arm, Chest, Abdomen, Face, Front perineal area, Buttocks, Right upper leg, Left upper leg, Right lower leg, Left lower leg         Bathing assist Assist Level: Contact Guard/Touching assist     Upper Body Dressing/Undressing Upper body dressing   What is the patient wearing?: Pull over shirt, Bra    Upper body assist Assist Level: Supervision/Verbal cueing    Lower Body Dressing/Undressing Lower body dressing      What is the patient wearing?: Pants, Incontinence brief     Lower body assist Assist for lower body dressing: Moderate Assistance - Patient  27 - 74%     Toileting Toileting    Toileting assist Assist for toileting: Minimal Assistance - Patient > 75%     Transfers Chair/bed transfer  Transfers assist     Chair/bed transfer assist level: Contact Guard/Touching assist Chair/bed transfer assistive device: Programmer, multimedia   Ambulation assist      Assist level: Contact Guard/Touching assist Assistive device: Walker-rolling Max distance: 147'   Walk 10 feet activity   Assist     Assist level: Contact Guard/Touching  assist Assistive device: Walker-rolling   Walk 50 feet activity   Assist Walk 50 feet with 2 turns activity did not occur: Safety/medical concerns  Assist level: Contact Guard/Touching assist Assistive device: Walker-rolling    Walk 150 feet activity   Assist Walk 150 feet activity did not occur: Safety/medical concerns         Walk 10 feet on uneven surface  activity   Assist Walk 10 feet on uneven surfaces activity did not occur: Safety/medical concerns         Wheelchair     Assist Will patient use wheelchair at discharge?: Yes (Per PT long term goals)   Wheelchair activity did not occur: Safety/medical concerns         Wheelchair 50 feet with 2 turns activity    Assist    Wheelchair 50 feet with 2 turns activity did not occur: Safety/medical concerns       Wheelchair 150 feet activity     Assist  Wheelchair 150 feet activity did not occur: Safety/medical concerns       Blood pressure 110/72, pulse 91, temperature 97.6 F (36.4 C), resp. rate 18, height 5\' 6"  (1.676 m), weight 88.8 kg, SpO2 100 %.  Medical Problem List and Plan: 1.  Altered mental status secondary to small left caudate head ICH with small IVH secondary hypertension and anticoagulation use             -patient may shower with thigh wounds covered              -ELOS/Goals: 14-16 days, min assist to supervision goals with PT, OT  and mod I to supervision with SLP  -Continue CIR therapies including PT, OT, and SLP, SNF pending  2.  Impaired mobility: continue SCDs             -antiplatelet therapy:  ASA 81mg  daily cleared by neuro 3. Pain Management: Tylenol as needed 4. Mood: Provide emotional support             -antipsychotic agents: N/A 5. Neuropsych: This patient is not capable of making decisions on her own behalf. 7. Fluids/Electrolytes/Nutrition: Routine in and outs with follow-up chemistries Recheck BMET given issues with orthostasis  8.  Hypertension.   Lisinopril 10 mg daily.  No longer on Lisinopril Monitor with increased mobility Vitals:   04/01/21 0449 04/01/21 1440  BP: 119/76 110/72  Pulse: 98 91  Resp:  18  Temp: 98.2 F (36.8 C) 97.6 F (36.4 C)  SpO2: 97% 100%   7/8 still some dizziness with therapy  -continue TEDS, abd binder Increase midodrine to 5mg  9.  Atrial fibrillation/pacemaker.  Amiodarone 200 mg twice daily.   Eliquis discontinued due to [place]- now only on ASA 81mg  daily  10.  Hypothyroidism.  Continue Synthroid 11.  Chronic left thigh wound.  Followed by Dr. Lucia Gaskins.  WOC following while in the hospital.  Dressing changes daily and prn.   -Wound VAC discontinued 03/21/2021 -continue  santyl to posterior wound, anterior wound still packed 12.  NHL with SVC syndrome/lung cancer radiation therapy as well as chemo.  Followed by Dr.Gudena 13.  Obesity.  Status post lap band surgery.  BMI 32.10.  Dietary follow-up 14.  Acute on chronic anemia.  Follow-up CBC Hbg improved to 10.3 15- constipation on Senna 2 po bid.  Edited diet order to include fiber rich foods, apples with meals as she likes these 16. Urinary frequency: UC for >100,000 E. Coli. UC pending: ordered fosfomycin x1 since her last UTI responded well to this. F/u susceptibilities.     LOS: 13 days A FACE TO FACE EVALUATION WAS PERFORMED  Clide Deutscher Rafan Sanders 04/01/2021, 4:41 PM

## 2021-04-02 LAB — URINE CULTURE: Culture: >=100000 — AB

## 2021-04-02 LAB — SARS CORONAVIRUS 2 (TAT 6-24 HRS): SARS Coronavirus 2: NEGATIVE

## 2021-04-02 MED ORDER — ASPIRIN 81 MG PO TBEC: 81.0000 mg | DELAYED_RELEASE_TABLET | Freq: Every day | ORAL | 11 refills | Status: DC

## 2021-04-02 MED ORDER — ACETAMINOPHEN 325 MG PO TABS: 650.0000 mg | ORAL_TABLET | ORAL | Status: DC | PRN

## 2021-04-02 MED ORDER — MIDODRINE HCL 5 MG PO TABS: 5.0000 mg | ORAL_TABLET | Freq: Two times a day (BID) | ORAL | Status: DC

## 2021-04-02 MED ORDER — COLLAGENASE 250 UNIT/GM EX OINT: TOPICAL_OINTMENT | Freq: Every day | CUTANEOUS | 0 refills | Status: DC

## 2021-04-02 MED ORDER — SENNOSIDES-DOCUSATE SODIUM 8.6-50 MG PO TABS: 2.0000 | ORAL_TABLET | Freq: Two times a day (BID) | ORAL | Status: DC

## 2021-04-02 MED ORDER — POLYETHYLENE GLYCOL 3350 17 G PO PACK: 17.0000 g | PACK | Freq: Every day | ORAL | 0 refills | Status: DC

## 2021-04-02 NOTE — Plan of Care (Signed)
  Problem: RH Balance Goal: LTG Patient will maintain dynamic sitting balance (PT) Description: LTG:  Patient will maintain dynamic sitting balance with assistance during mobility activities (PT) Outcome: Completed/Met Goal: LTG Patient will maintain dynamic standing balance (PT) Description: LTG:  Patient will maintain dynamic standing balance with assistance during mobility activities (PT) Outcome: Completed/Met   Problem: Sit to Stand Goal: LTG:  Patient will perform sit to stand with assistance level (PT) Description: LTG:  Patient will perform sit to stand with assistance level (PT) Outcome: Completed/Met   Problem: RH Bed Mobility Goal: LTG Patient will perform bed mobility with assist (PT) Description: LTG: Patient will perform bed mobility with assistance, with/without cues (PT). Outcome: Completed/Met   Problem: RH Bed to Chair Transfers Goal: LTG Patient will perform bed/chair transfers w/assist (PT) Description: LTG: Patient will perform bed to chair transfers with assistance (PT). Outcome: Completed/Met   Problem: RH Car Transfers Goal: LTG Patient will perform car transfers with assist (PT) Description: LTG: Patient will perform car transfers with assistance (PT). Outcome: Completed/Met   Problem: RH Ambulation Goal: LTG Patient will ambulate in controlled environment (PT) Description: LTG: Patient will ambulate in a controlled environment, # of feet with assistance (PT). Outcome: Completed/Met Goal: LTG Patient will ambulate in home environment (PT) Description: LTG: Patient will ambulate in home environment, # of feet with assistance (PT). Outcome: Completed/Met   Problem: RH Wheelchair Mobility Goal: LTG Patient will propel w/c in controlled environment (PT) Description: LTG: Patient will propel wheelchair in controlled environment, # of feet with assist (PT) Outcome: Not Met (add Reason) Note: Needs min A due to veering R with R UE weakness  Magda Kiel, PT

## 2021-04-02 NOTE — Progress Notes (Signed)
Physical Therapy Discharge Summary  Patient Details  Name: Kayla Harrison MRN: [id number] Date of Birth: Apr 06, 1946  Today's Date: 04/02/2021 PT Individual Time: Session1: 1130-1200; Arthor Captain: [id number] PT Individual Time Calculation (min): 30 min & 45 min  Skilled Therapeutic Interventions/Progress Updates:  Session1:  Patient in recliner and reports wants to try walking with shoes on.  Seated edge of recliner doffed slipper socks with S and donned regular socks min A.  Donned shoes with min A as well for time management demonstrating dynamic sitting balance independent without safety issues.  Sit to stand to RW with S and ambulated to dayroom x 150' with RW and S with w/c close in case needed to rest.  Patient negotiated obstacle course stepping around cones and over hurdles with RW and min A for walker management over hurdles and to safely negotiate them.  Patient assisted in w/c to room and ambulated with S back to recliner.  Left with seat belt alarm active and needs in reach.  [place]:  Patient in recliner and performed  sit to stand S to RW and ambulated 15' to w/c. In w/c propelled x 150' with S first 59' and min A to prevent veering to R.  Patient in ADL apartment practiced gait over carpet and tile transitions with S and cues.  Performed furniture transfer with min A for safety due to low recliner.  Assisted in w/c to ortho gym and pt performed ramp negotiation with RW and CGA for safety.  Negotiated 4 steps with rails and CGA.  Seated for LE strength measurement.  Patient encouraged with progress even over last week with no further R knee buckling nor pain.  Patient ambulated another 79' towards her room with RW and S with w/c close for safety.  Assisted to room in w/c and pt requesting to toilet.  Performed with CGA and returned to EOB with S with RW.  Seated to remove shoes with S.  Sit to supine with S.  Patient left with call bell and needs in reach and bed alarm active.    Patient  has met 8 of 9 long term goals due to improved activity tolerance, improved balance, improved postural control, and increased strength.  Patient to discharge at an ambulatory level Supervision.   Patient's care partner is independent and as she is discharging to SNF at this time  to provide the necessary physical assistance at discharge.  Reasons goals not met: Needs min A for w/c mobility after R UE fatigues due to difficulty compensating.  All other goals met.  Recommendation:  Patient will benefit from ongoing skilled PT services in skilled nursing facility setting to continue to advance safe functional mobility, address ongoing impairments in balance, R LE strength, cognition, and minimize fall risk.  Equipment: No equipment provided  Reasons for discharge: discharge from hospital  Patient/family agrees with progress made and goals achieved: Yes  PT Discharge Precautions/Restrictions Precautions Precautions: Fall Precaution Comments: watch for orthostatic hypotension, contact precautions Vital Signs Therapy Vitals Temp: 98.1 F (36.7 C) Pulse Rate: 89 Resp: 17 BP: 106/67 Patient Position (if appropriate): Sitting Oxygen Therapy SpO2: 100 % O2 Device: Room Air Pain Pain Assessment Pain Score: 0-No pain Vision/Perception  Perception Perception: Within Functional Limits Praxis Praxis: Intact  Cognition Overall Cognitive Status: Impaired/Different from baseline Arousal/Alertness: Awake/alert Orientation Level: Oriented X4 Attention: Selective Memory: Impaired Problem Solving: Impaired Safety/Judgment: Impaired Comments: needs cues to remember to compensate for deficits Sensation Sensation Light Touch: Impaired by gross assessment Additional Comments:  Pt reports altered sensation along L/R LEs (neuropathy) Coordination Gross Motor Movements are Fluid and Coordinated: No Fine Motor Movements are Fluid and Coordinated: No Heel Shin Test: decreased ROM bilaterally  due to swelling and lymphedema Motor  Motor Motor: Hemiplegia Motor - Discharge Observations: R side still weaker, but overall stronger, no longer buckling R knee  Mobility Bed Mobility Bed Mobility: Rolling Right;Rolling Left;Sit to Supine;Supine to Sit;Scooting to [facility] Rolling Right: Supervision/verbal cueing Rolling Left: Supervision/Verbal cueing Supine to Sit: Supervision/Verbal cueing Sit to Supine: Supervision/Verbal cueing Transfers Sit to Stand: Supervision/Verbal cueing Stand to Sit: Supervision/Verbal cueing Stand Pivot Transfers: Supervision/Verbal cueing Transfer (Assistive device): Rolling walker Locomotion  Gait Ambulation: Yes Gait Assistance: Supervision/Verbal cueing Gait Distance (Feet): 150 Feet Assistive device: Rolling walker Gait Gait: Yes Gait Pattern: Impaired Gait Pattern: Step-through pattern;Decreased hip/knee flexion - left;Decreased hip/knee flexion - right;Decreased stride length;Right flexed knee in stance;Left flexed knee in stance Stairs / Additional Locomotion Stairs: Yes Stairs Assistance: Contact Guard/Touching assist Stair Management Technique: No rails Number of Stairs: 4 Height of Stairs: 6 Ramp: Contact Guard/touching assist Wheelchair Mobility Wheelchair Mobility: Yes Wheelchair Assistance: Minimal assistance - Patient >75% Wheelchair Propulsion: Both upper extremities Wheelchair Parts Management: Needs assistance Distance: 150'  Trunk/Postural Assessment  Cervical Assessment Cervical Assessment: Exceptions to [facility] (forward head) Thoracic Assessment Thoracic Assessment: Exceptions to [facility] (rounded shoulders) Lumbar Assessment Lumbar Assessment: Exceptions to [facility] (posterior pelvic tilt) Postural Control Postural Control: Deficits on evaluation Protective Responses: improved, but still with posterior bias at times  Balance Balance Balance Assessed: Yes Static Sitting Balance Static Sitting - Balance Support: Feet supported;No  upper extremity supported Static Sitting - Level of Assistance: 7: Independent Dynamic Sitting Balance Dynamic Sitting - Balance Support: Feet supported;Right upper extremity supported;Left upper extremity supported Dynamic Sitting - Level of Assistance: 6: Modified independent (Device/Increase time) Static Standing Balance Static Standing - Balance Support: Bilateral upper extremity supported Static Standing - Level of Assistance: 5: Stand by assistance Dynamic Standing Balance Dynamic Standing - Balance Support: Bilateral upper extremity supported Dynamic Standing - Level of Assistance: 5: Stand by assistance Extremity Assessment      RLE Assessment RLE Assessment: Exceptions to [facility] Passive Range of Motion (PROM) Comments: WFL Active Range of Motion (AROM) Comments: WFL except hip flexion due to weakness General Strength Comments: hip flexion 2+/5, knee extension 4-/5, ankle DF 4-/5 LLE Assessment LLE Assessment: Exceptions to [facility] Active Range of Motion (AROM) Comments: generally WFL but some limitations from girth of legs General Strength Comments: hip flexion 3+/5, knee extension 4/5, ankle DF [id number] [place], PT 04/02/2021, 1:44 PM

## 2021-04-02 NOTE — Progress Notes (Addendum)
Patient ID: Kayla Harrison, female   DOB: 08-09-1946, 75 y.o.   MRN: [id number]   met with pt and spoke with Kayla Harrison her sister to discuss the plan. Both are agreeable to going to a SNF from here and are aware [place] has offered. Pt is wanting to know when she would go. Aware awaiting return call form [facility] and will let her know what day has a bed.  12:23 PM Spoke with [facility] they will have a bed for pt tomorrow. Have spoken with pt and sister-Kayla Harrison to let them know of plan for tomorrow. Pam-PA will order COVID test for later today so results back in am. Plan to transfer around 11:00

## 2021-04-02 NOTE — Progress Notes (Signed)
Occupational Therapy Discharge Summary  Patient Details  Name: Kayla Harrison MRN: [id number] Date of Birth: April 20, 1946   Patient has met 1 of 5 long term goals due to improved activity tolerance, improved balance, and improved awareness.  Patient to discharge at overall Supervision level.  Patient's care partner is independent to provide the necessary physical and cognitive assistance at discharge as pt is discharging to SNF.    Reasons goals not met: Pt discharging sooner than expected to SNF. During functional activities/ADLs, pt most often required CGA and close spvsn to complete tasks safely due to balance deficits, orthostatic vitals, limited safety awareness, decreased endurance/standing tolerance. Pt requires spvsn for seated/bed level tasks, but did not reach goal levels of mod I as pt frequently became symptomatic and overly fatigued when bending/reaching for tasks when seated. Intermittent min A required during standing level tasks including clothing management due to balance deficits.   Recommendation:  Patient will benefit from ongoing skilled OT services in skilled nursing facility setting to continue to advance functional skills in the area of BADL, iADL, and Reduce care partner burden.  Equipment: No equipment provided  Reasons for discharge: discharge from hospital  Patient/family agrees with progress made and goals achieved: Yes  OT Discharge Precautions/Restrictions  Precautions Precautions: Fall Precaution Comments: watch for orthostatic hypotension, contact precautions Restrictions Weight Bearing Restrictions: No Pain Pain Assessment Pain Score: 0-No pain ADL ADL Eating: Set up Where Assessed-Eating: Chair Grooming: Setup Where Assessed-Grooming: Sitting at sink Upper Body Bathing: Setup, Supervision/safety Where Assessed-Upper Body Bathing: Sitting at sink, Edge of bed Lower Body Bathing: Supervision/safety, Contact guard Where Assessed-Lower Body  Bathing: Standing at sink, Sitting at sink, Edge of bed Upper Body Dressing: Supervision/safety, Setup Where Assessed-Upper Body Dressing: Edge of bed, Wheelchair Lower Body Dressing: Contact guard, Minimal assistance Where Assessed-Lower Body Dressing: Sitting at sink, Standing at sink, Wheelchair, Edge of bed Toileting: Contact guard, Minimal assistance Where Assessed-Toileting: Toilet, Bedside Commode Toilet Transfer: Therapist, music Method: Ambulating, Stand pivot Toilet Transfer Equipment: Extra wide drop arm bedside commode, Other (comment), Grab bars (standard height toilet) Tub/Shower Transfer: Not assessed Social research officer, government: Not assessed Vision Baseline Vision/History: Wears glasses Wears Glasses: Reading only Patient Visual Report: Blurring of vision Additional Comments: General visual assessment conducted 7/10, slight decreased speed in R eye during convergence/divergence; blurring of vision far away Perception  Perception: Within Functional Limits Praxis Praxis: Intact Cognition Overall Cognitive Status: Impaired/Different from baseline Arousal/Alertness: Awake/alert Orientation Level: Oriented X4 Attention: Selective Sustained Attention: Appears intact Selective Attention: Impaired Selective Attention Impairment: Verbal basic;Functional basic Memory: Impaired Memory Impairment: Retrieval deficit;Storage deficit;Decreased recall of new information;Decreased short term memory Awareness: Impaired Awareness Impairment: Emergent impairment Problem Solving: Impaired Problem Solving Impairment: Functional complex;Verbal complex Safety/Judgment: Impaired Comments: needs cues to remember to compensate for deficits Sensation Sensation Light Touch: Impaired by gross assessment Hot/Cold: Appears Intact Proprioception: Appears Intact Stereognosis: Appears Intact Additional Comments: Pt reports altered sensation along L/R LEs  (neuropathy) Coordination Gross Motor Movements are Fluid and Coordinated: No Fine Motor Movements are Fluid and Coordinated: No Motor  Motor Motor: Hemiplegia Motor - Discharge Observations: R side still weaker, but overall stronger, no longer buckling R knee Mobility  Bed Mobility Bed Mobility: Rolling Right;Rolling Left;Sit to Supine;Supine to Sit;Scooting to [facility] Rolling Right: Supervision/verbal cueing Rolling Left: Supervision/Verbal cueing Supine to Sit: Supervision/Verbal cueing Sit to Supine: Supervision/Verbal cueing Scooting to HOB: Supervision/Verbal Cueing Transfers Sit to Stand: Contact Guard/Touching assist;Supervision/Verbal cueing Stand to Sit: Contact Guard/Touching assist;Supervision/Verbal cueing  Trunk/Postural Assessment  Cervical Assessment Cervical Assessment: Exceptions to [facility] (forward head) Thoracic Assessment Thoracic Assessment: Exceptions to [facility] (rounded shoulders) Lumbar Assessment Lumbar Assessment: Exceptions to [facility] (posterior pelvic tilt) Postural Control Postural Control: Deficits on evaluation Protective Responses: improved, but still with posterior bias at times  Balance Balance Balance Assessed: Yes Static Sitting Balance Static Sitting - Balance Support: Feet supported;No upper extremity supported Static Sitting - Level of Assistance: 7: Independent Dynamic Sitting Balance Dynamic Sitting - Balance Support: Feet supported;Right upper extremity supported;Left upper extremity supported Dynamic Sitting - Level of Assistance: 5: Stand by assistance Dynamic Sitting - Balance Activities: Reaching for objects;Forward lean/weight shifting;Reaching across midline;Lateral lean/weight shifting Static Standing Balance Static Standing - Balance Support: Bilateral upper extremity supported;Right upper extremity supported;Left upper extremity supported Static Standing - Level of Assistance: 5: Stand by assistance Dynamic Standing Balance Dynamic  Standing - Balance Support: Bilateral upper extremity supported;Right upper extremity supported;Left upper extremity supported;During functional activity Dynamic Standing - Level of Assistance: 5: Stand by assistance;4: Min assist Dynamic Standing - Balance Activities: Lateral lean/weight shifting;Reaching across midline;Reaching for objects Extremity/Trunk Assessment RUE Assessment RUE Assessment: Within Functional Limits General Strength Comments: RUE [place] and strength slightly decreased, but WFL LUE Assessment LUE Assessment: Within Functional Limits   Mellissa Kohut 04/02/2021, 7:30 PM

## 2021-04-02 NOTE — Discharge Summary (Signed)
Physician Discharge Summary  Patient ID: Kayla Harrison MRN: [id number] DOB/AGE: 1945/11/03 75 y.o.  Admit date: 03/19/2021 Discharge date: 04/03/2021  Discharge Diagnoses:  Principal Problem:   ICH (intracerebral hemorrhage) ([place]) Hypertension Atrial fibrillation with pacemaker Hypothyroidism Chronic left thigh wound NHL with SVC syndrome/lung cancer Obesity Acute on chronic anemia E. coli UTI   Discharged Condition: Stable  Significant Diagnostic Studies: CT Angio Head W or Wo Contrast  Result Date: 03/14/2021 CLINICAL DATA:  Neuro deficit, acute stroke suspected. Hemorrhage seen on same day CT head. EXAM: CT ANGIOGRAPHY HEAD AND NECK TECHNIQUE: Multidetector CT imaging of the head and neck was performed using the standard protocol during bolus administration of intravenous contrast. Multiplanar CT image reconstructions and MIPs were obtained to evaluate the vascular anatomy. Carotid stenosis measurements (when applicable) are obtained utilizing NASCET criteria, using the distal internal carotid diameter as the denominator. CONTRAST:  75mL OMNIPAQUE IOHEXOL 350 MG/ML SOLN COMPARISON:  None. FINDINGS: CTA NECK FINDINGS Aortic arch: Great vessel origins are patent. Right carotid system: No evidence of dissection, stenosis (50% or greater) or occlusion. Mild calcific atherosclerosis at the carotid bifurcation. Left carotid system: No evidence of dissection, stenosis (50% or greater) or occlusion. Mild calcific atherosclerosis at the carotid bifurcation. Vertebral arteries: Codominant. No evidence of dissection, stenosis (50% or greater) or occlusion. Skeleton: Severe degenerative disc disease at C5-C6 and C6-C7 with disc height loss and endplate spurring. Heterogeneous bone marrow and osteopenia. Other neck: No acute abnormality. Upper chest: Further evaluated on same day CT chest. Debris in the esophagus. Review of the MIP images confirms the above findings CTA HEAD FINDINGS Anterior  circulation: Bilateral ICAs, MCAs, and ACAs are patent without proximal hemodynamically significant stenosis. Calcific atherosclerosis of bilateral intracranial ICAs without greater than 50% narrowing. No aneurysm. No visible vascular malformation in the region of the patient's known hemorrhage, although acute blood products limit evaluation. Posterior circulation: Patent intradural vertebral arteries and basilar artery. Mild calcific atherosclerotic narrowing of the basilar artery. Bilateral posterior cerebral arteries are patent. Mild-to-moderate left P1 and P2 PCA stenosis. Venous sinuses: As permitted by contrast timing, patent. Review of the MIP images confirms the above findings IMPRESSION: CTA Head: 1. No large vessel occlusion. Mild-to-moderate left P1 and P2 PCA stenosis. 2. No aneurysm or visible vascular malformation in the region of the patient's known hemorrhage, although acute blood products limits evaluation. CTA Neck: 1. No significant (greater than 50%) stenosis. 2. Please see same day CT chest for intrathoracic evaluation. Findings discussed with Dr. Karle Starch via telephone at 8:18 p.m. Electronically Signed   By: Margaretha Sheffield MD   On: 03/14/2021 20:27   CT HEAD WO CONTRAST  Result Date: 03/15/2021 CLINICAL DATA:  Intracranial hemorrhage, follow-up EXAM: CT HEAD WITHOUT CONTRAST TECHNIQUE: Contiguous axial images were obtained from the base of the skull through the vertex without intravenous contrast. COMPARISON:  03/14/2021 FINDINGS: Brain: Area of parenchymal hemorrhage is again identified in the region of the left caudothalamic groove with intraventricular extension. Mild adjacent edema. No substantial change since the prior study. No new hemorrhage. There is unchanged mild trapping of the left lateral ventricle. No new loss of gray-white differentiation. Stable findings of probable advanced chronic microvascular ischemic changes. Chronic infarct of the left caudate head. Vascular: There  is atherosclerotic calcification at the skull base. Skull: Calvarium is unremarkable. Sinuses/Orbits: No acute finding. Other: None. IMPRESSION: Similar appearance of left caudothalamic hemorrhage with intraventricular extension and mild adjacent edema. No new hemorrhage. Unchanged mild trapping of the left lateral  ventricle. Electronically Signed   By: Macy Mis M.D.   On: 03/15/2021 08:26   CT Head Wo Contrast  Result Date: 03/14/2021 CLINICAL DATA:  75 year old female with altered mental status. EXAM: CT HEAD WITHOUT CONTRAST TECHNIQUE: Contiguous axial images were obtained from the base of the skull through the vertex without intravenous contrast. COMPARISON:  Head CT dated 12/15/2020 FINDINGS: Brain: There is moderate age-related atrophy and chronic microvascular ischemic changes. Acute intracranial hemorrhage extends from the region of the caudothalamic groove into the left lateral ventricle and through the left foramen Monro into the third ventricle. There is mild dilatation of the left lateral ventricle. There is approximately 5 mm left-to-right midline shift. Vascular: No hyperdense vessel or unexpected calcification. Skull: Normal. Negative for fracture or focal lesion. Sinuses/Orbits: No acute finding. Other: None IMPRESSION: 1. Acute intracranial hemorrhage extending from the region of the left caudothalamic groove into the left lateral ventricle and through the left foramen Monro into the third ventricle. There is mild dilatation of the left lateral ventricle and approximately 5 mm left-to-right midline shift. Findings may be hypertensive in nature or secondary to rupture of A-comm aneurysm. Further evaluation with CT angiography is recommended. 2. Moderate age-related atrophy and chronic microvascular ischemic changes. These results were called by telephone at the time of interpretation on 03/14/2021 at 6:53 pm to Dr. Kathrynn Humble, who verbally acknowledged these results. Electronically Signed    By: Anner Crete M.D.   On: 03/14/2021 18:58   CT Angio Neck W and/or Wo Contrast  Result Date: 03/14/2021 CLINICAL DATA:  Neuro deficit, acute stroke suspected. Hemorrhage seen on same day CT head. EXAM: CT ANGIOGRAPHY HEAD AND NECK TECHNIQUE: Multidetector CT imaging of the head and neck was performed using the standard protocol during bolus administration of intravenous contrast. Multiplanar CT image reconstructions and MIPs were obtained to evaluate the vascular anatomy. Carotid stenosis measurements (when applicable) are obtained utilizing NASCET criteria, using the distal internal carotid diameter as the denominator. CONTRAST:  65mL OMNIPAQUE IOHEXOL 350 MG/ML SOLN COMPARISON:  None. FINDINGS: CTA NECK FINDINGS Aortic arch: Great vessel origins are patent. Right carotid system: No evidence of dissection, stenosis (50% or greater) or occlusion. Mild calcific atherosclerosis at the carotid bifurcation. Left carotid system: No evidence of dissection, stenosis (50% or greater) or occlusion. Mild calcific atherosclerosis at the carotid bifurcation. Vertebral arteries: Codominant. No evidence of dissection, stenosis (50% or greater) or occlusion. Skeleton: Severe degenerative disc disease at C5-C6 and C6-C7 with disc height loss and endplate spurring. Heterogeneous bone marrow and osteopenia. Other neck: No acute abnormality. Upper chest: Further evaluated on same day CT chest. Debris in the esophagus. Review of the MIP images confirms the above findings CTA HEAD FINDINGS Anterior circulation: Bilateral ICAs, MCAs, and ACAs are patent without proximal hemodynamically significant stenosis. Calcific atherosclerosis of bilateral intracranial ICAs without greater than 50% narrowing. No aneurysm. No visible vascular malformation in the region of the patient's known hemorrhage, although acute blood products limit evaluation. Posterior circulation: Patent intradural vertebral arteries and basilar artery. Mild  calcific atherosclerotic narrowing of the basilar artery. Bilateral posterior cerebral arteries are patent. Mild-to-moderate left P1 and P2 PCA stenosis. Venous sinuses: As permitted by contrast timing, patent. Review of the MIP images confirms the above findings IMPRESSION: CTA Head: 1. No large vessel occlusion. Mild-to-moderate left P1 and P2 PCA stenosis. 2. No aneurysm or visible vascular malformation in the region of the patient's known hemorrhage, although acute blood products limits evaluation. CTA Neck: 1. No significant (greater  than 50%) stenosis. 2. Please see same day CT chest for intrathoracic evaluation. Findings discussed with Dr. Karle Starch via telephone at 8:18 p.m. Electronically Signed   By: Margaretha Sheffield MD   On: 03/14/2021 20:27   CT CHEST W CONTRAST  Result Date: 03/14/2021 CLINICAL DATA:  Slipped and fell. Patient is alert and oriented x4. Cough for unknown period of time. EXAM: CT CHEST WITH CONTRAST TECHNIQUE: Multidetector CT imaging of the chest was performed during intravenous contrast administration. CONTRAST:  65mL OMNIPAQUE IOHEXOL 350 MG/ML SOLN COMPARISON:  None. FINDINGS: Cardiovascular: No significant vascular findings. Normal heart size. No pericardial effusion. Thoracic aortic atherosclerosis. Multi vessel coronary artery atherosclerosis. Two lead cardiac pacemaker. Mediastinum/Nodes: No enlarged mediastinal, hilar, or axillary lymph nodes. Thyroid gland, trachea, and esophagus demonstrate no significant findings. Lungs/Pleura: Small right pleural effusion. Trace left pleural effusion. No focal consolidation or pneumothorax. Upper Abdomen: No acute upper abdominal abnormality. 10 mm cyst in the left hepatic lobe. Musculoskeletal: No acute osseous abnormality. No aggressive osseous lesion. Generalized osteopenia. Anterior bridging osteophytes of the lower thoracic spine. Right shoulder arthroplasty. IMPRESSION: 1. Small right and trace left pleural effusion. 2.  Aortic  Atherosclerosis (ICD10-I70.0). Electronically Signed   By: Kathreen Devoid   On: 03/14/2021 19:57   ECHOCARDIOGRAM COMPLETE  Result Date: 03/16/2021    ECHOCARDIOGRAM REPORT   Patient Name:   Kayla Harrison Date of Exam: 03/16/2021 Medical Rec #:  [id number]           Height:       66.0 in Accession #:    [id number]          Weight:       198.9 lb Date of Birth:  07/22/1946            BSA:          1.995 m Patient Age:    89 years            BP:           132/65 mmHg Patient Gender: F                   HR:           70 bpm. Exam Location:  Inpatient Procedure: 2D Echo, 3D Echo, Cardiac Doppler and Color Doppler Indications:    Stroke  History:        Patient has prior history of Echocardiogram examinations, most                 recent 03/09/2021. Abnormal ECG and Pacemaker, Arrythmias:Atrial                 Fibrillation and Atrial Flutter, Signs/Symptoms:Syncope, Dyspnea                 and Bacteremia; Risk Factors:Dyslipidemia and Sleep Apnea. ICH.  Sonographer:    Roseanna Rainbow RDCS Referring Phys: [place]  Sonographer Comments: Technically difficult study due to poor echo windows. IMPRESSIONS  1. Left ventricular ejection fraction, by estimation, is 55 to 60%. The left ventricle has normal function. The left ventricle has no regional wall motion abnormalities. Left ventricular diastolic parameters are indeterminate.  2. Right ventricular systolic function is normal. The right ventricular size is normal. There is moderately elevated pulmonary artery systolic pressure.  3. The mitral valve is abnormal. Severe mitral valve regurgitation. No evidence of mitral stenosis. Severe mitral annular calcification.  4. Tricuspid valve regurgitation is severe.  5. The aortic valve  is calcified. Aortic valve regurgitation is trivial. Mild to moderate aortic valve sclerosis/calcification is present, without any evidence of aortic stenosis.  6. The inferior vena cava is dilated in size with <50% respiratory  variability, suggesting right atrial pressure of 15 mmHg. FINDINGS  Left Ventricle: Left ventricular ejection fraction, by estimation, is 55 to 60%. The left ventricle has normal function. The left ventricle has no regional wall motion abnormalities. The left ventricular internal cavity size was normal in size. There is  no left ventricular hypertrophy. Left ventricular diastolic parameters are indeterminate. Right Ventricle: The right ventricular size is normal. Right ventricular systolic function is normal. There is moderately elevated pulmonary artery systolic pressure. The tricuspid regurgitant velocity is 3.06 m/s, and with an assumed right atrial pressure of 15 mmHg, the estimated right ventricular systolic pressure is 31.4 mmHg. Left Atrium: Left atrial size was normal in size. Right Atrium: Right atrial size was normal in size. Pericardium: There is no evidence of pericardial effusion. Mitral Valve: The mitral valve is abnormal. Severe mitral annular calcification. Severe mitral valve regurgitation. No evidence of mitral valve stenosis. MV peak gradient, 13.4 mmHg. The mean mitral valve gradient is 4.0 mmHg. Tricuspid Valve: The tricuspid valve is normal in structure. Tricuspid valve regurgitation is severe. No evidence of tricuspid stenosis. Aortic Valve: The aortic valve is calcified. Aortic valve regurgitation is trivial. Mild to moderate aortic valve sclerosis/calcification is present, without any evidence of aortic stenosis. Aortic valve mean gradient measures 9.0 mmHg. Aortic valve peak  gradient measures 15.4 mmHg. Aortic valve area, by VTI measures 2.09 cm. Pulmonic Valve: The pulmonic valve was not well visualized. Pulmonic valve regurgitation is not visualized. No evidence of pulmonic stenosis. Aorta: The aortic root is normal in size and structure. Venous: The inferior vena cava is dilated in size with less than 50% respiratory variability, suggesting right atrial pressure of 15 mmHg. IAS/Shunts:  No atrial level shunt detected by color flow Doppler. Additional Comments: A device lead is visualized.  LEFT VENTRICLE PLAX 2D LVIDd:         5.10 cm      Diastology LVIDs:         4.00 cm      LV e' medial:  9.90 cm/s LV PW:         0.90 cm      LV e' lateral: 11.00 cm/s LV IVS:        0.80 cm LVOT diam:     1.90 cm LV SV:         83 LV SV Index:   42 LVOT Area:     2.84 cm  LV Volumes (MOD) LV vol d, MOD A2C: 130.0 ml LV vol d, MOD A4C: 141.0 ml LV vol s, MOD A2C: 49.6 ml LV vol s, MOD A4C: 75.8 ml LV SV MOD A2C:     80.4 ml LV SV MOD A4C:     141.0 ml LV SV MOD BP:      70.7 ml RIGHT VENTRICLE            IVC RV S prime:     8.16 cm/s  IVC diam: 2.50 cm TAPSE (M-mode): 1.3 cm LEFT ATRIUM             Index       RIGHT ATRIUM           Index LA diam:        3.80 cm 1.90 cm/m  RA Area:  19.50 cm LA Vol (A2C):   41.7 ml 20.90 ml/m RA Volume:   53.00 ml  26.57 ml/m LA Vol (A4C):   59.1 ml 29.63 ml/m LA Biplane Vol: 51.7 ml 25.92 ml/m  AORTIC VALVE AV Area (Vmax):    2.24 cm AV Area (Vmean):   2.09 cm AV Area (VTI):     2.09 cm AV Vmax:           196.00 cm/s AV Vmean:          137.000 cm/s AV VTI:            0.398 m AV Peak Grad:      15.4 mmHg AV Mean Grad:      9.0 mmHg LVOT Vmax:         155.00 cm/s LVOT Vmean:        101.000 cm/s LVOT VTI:          0.293 m LVOT/AV VTI ratio: 0.74  AORTA Ao Root diam: 3.10 cm Ao Asc diam:  3.30 cm MITRAL VALVE                 TRICUSPID VALVE MV Area (PHT): 5.02 cm      TR Peak grad:   37.5 mmHg MV Area VTI:   2.17 cm      TR Vmax:        306.00 cm/s MV Peak grad:  13.4 mmHg MV Mean grad:  4.0 mmHg      SHUNTS MV Vmax:       1.83 m/s      Systemic VTI:  0.29 m MV Vmean:      81.1 cm/s     Systemic Diam: 1.90 cm MV Decel Time: 151 msec MR Peak grad:    111.5 mmHg MR Mean grad:    69.0 mmHg MR Vmax:         528.00 cm/s MR Vmean:        383.0 cm/s MR PISA:         0.57 cm MR PISA Eff ROA: 3 mm MR PISA Radius:  0.30 cm Kirk Ruths MD Electronically signed by Kirk Ruths MD Signature Date/Time: 03/16/2021/1:01:03 PM    Final    ECHO TEE  Result Date: 03/09/2021    TRANSESOPHOGEAL ECHO REPORT   Patient Name:   Kayla Harrison Date of Exam: 03/09/2021 Medical Rec #:  [id number]           Height:       66.0 in Accession #:    [id number]          Weight:       210.0 lb Date of Birth:  05-02-1946            BSA:          2.042 m Patient Age:    68 years            BP:           159/102 mmHg Patient Gender: F                   HR:           106 bpm. Exam Location:  Inpatient Procedure: 3D Echo, Transesophageal Echo, Cardiac Doppler and Color Doppler Indications:     I48.91* Unspeicified atrial fibrillation  History:         Patient has prior history of Echocardiogram examinations, most  recent 12/10/2019. Abnormal ECG and Pacemaker, Stroke,                  Arrythmias:Atrial Fibrillation and Atrial Flutter,                  Signs/Symptoms:Shortness of Breath, Dyspnea and Syncope; Risk                  Factors:Dyslipidemia and Sleep Apnea.  Sonographer:     Roseanna Rainbow RDCS Referring Phys:  [place] Diagnosing Phys: Lyman Bishop MD PROCEDURE: After discussion of the risks and benefits of a TEE, an informed consent was obtained from the patient. The transesophogeal probe was passed without difficulty through the esophogus of the patient. Imaged were obtained with the patient in a supine position. Sedation performed by different physician. The patient was monitored while under deep sedation. Anesthestetic sedation was provided intravenously by Anesthesiology: 280mg  of Propofol. The patient developed no complications during the procedure. A successful direct current cardioversion was performed at 120 joules with 1 attempt. IMPRESSIONS  1. Left ventricular ejection fraction, by estimation, is 55 to 60%. The left ventricle has normal function. There is mild left ventricular hypertrophy.  2. Right ventricular systolic function is normal. The right  ventricular size is normal.  3. Wide neck, short appendage. Left atrial size was moderately dilated. No left atrial/left atrial appendage thrombus was detected.  4. Right atrial size was mildly dilated.  5. Moderate mitral subvalvular calcification.  6. The mitral valve is abnormal. Moderate mitral valve regurgitation. Mild mitral stenosis. The mean mitral valve gradient is 5.0 mmHg. Moderate to severe mitral annular calcification.  7. Tricuspid valve regurgitation is moderate.  8. The aortic valve is tricuspid. Aortic valve regurgitation is not visualized. Mild aortic valve sclerosis is present, with no evidence of aortic valve stenosis. Conclusion(s)/Recommendation(s): No LA/LAA thrombus identified. Successful cardioversion performed with restoration of normal sinus rhythm. FINDINGS  Left Ventricle: Left ventricular ejection fraction, by estimation, is 55 to 60%. The left ventricle has normal function. The left ventricular internal cavity size was normal in size. There is mild left ventricular hypertrophy. Right Ventricle: The right ventricular size is normal. No increase in right ventricular wall thickness. Right ventricular systolic function is normal. Left Atrium: Wide neck, short appendage. Left atrial size was moderately dilated. Spontaneous echo contrast was present. No left atrial/left atrial appendage thrombus was detected. Right Atrium: Right atrial size was mildly dilated. Pericardium: There is no evidence of pericardial effusion. Mitral Valve: The mitral valve is abnormal. Moderate to severe mitral annular calcification. Moderate mitral subvalvular calcification. Moderate mitral valve regurgitation, with 2 jets. Mild mitral valve stenosis. MV peak gradient, 12.1 mmHg. The mean mitral valve gradient is 5.0 mmHg. Tricuspid Valve: The tricuspid valve is grossly normal. Tricuspid valve regurgitation is moderate. Aortic Valve: The aortic valve is tricuspid. Aortic valve regurgitation is not visualized. Mild  aortic valve sclerosis is present, with no evidence of aortic valve stenosis. Pulmonic Valve: The pulmonic valve was grossly normal. Pulmonic valve regurgitation is mild. No evidence of pulmonic stenosis. Aorta: The aortic root and ascending aorta are structurally normal, with no evidence of dilitation. IAS/Shunts: No atrial level shunt detected by color flow Doppler. Additional Comments: A device lead is visualized.  MITRAL VALVE MV Peak grad: 12.1 mmHg MV Mean grad: 5.0 mmHg MV Vmax:      1.74 m/s MV Vmean:     97.3 cm/s Lyman Bishop MD Electronically signed by Lyman Bishop MD Signature Date/Time:  03/09/2021/2:57:31 PM    Final    CUP PACEART REMOTE DEVICE CHECK  Result Date: 03/08/2021 Scheduled remote reviewed. Normal device function.  AF burden 27.4%; longest logged 16 hours; EGMs suggest A Flutter - ongoing; V rate histogram shows ~85% binned >100 bpm. History of AF and prescribed amiodarone. Routing to triage for ongoing. 8 VT-NS events logged 1 to 2 seconds w/ rates 150's to 170's bpm; available EGMs suggest SVT Next remote 91 days. HB Patient has cardioversion scheduled on 03/09/21. [facility] RN   Labs:  Basic Metabolic Panel: No results for input(s): NA, K, CL, CO2, GLUCOSE, BUN, CREATININE, CALCIUM, MG, PHOS in the last 168 hours.  CBC: No results for input(s): WBC, NEUTROABS, HGB, HCT, MCV, PLT in the last 168 hours.  CBG: No results for input(s): GLUCAP in the last 168 hours.  Family history.  Father with CAD and colon cancer.  Sister and brother with diabetes.  Negative for esophageal cancer or liver disease or pancreatic cancer  Brief HPI:   Kayla Harrison is a 75 y.o. right-handed female well-known to rehab services from admission 02/13/2021 to 02/23/2021 for debility secondary to thigh wound hematoma status post irrigation debridement 02/06/2021 with wound VAC placement and drain as well as history of thyroid cancer hypothyroidism, CVA, NHL/SVC syndrome/lung cancer radiation therapy as  well as chemotherapy 2018, pacemaker for atrial fibrillation maintained on Eliquis in the past followed by Dr. Caryl Comes, obesity status post lap band surgery hypertension hyperlipidemia and recurrent falls.  Per chart review lives alone independent with assistive device.  Presented 03/14/2021 with altered mental status found down after she reportedly slipped off the bed.  No loss of consciousness.  Admission chemistries unremarkable hemoglobin 10.6 SARS coronavirus negative CK 41.  Cranial CT scan showed acute intracranial hemorrhage extending from the region of the left caudothalamic groove into the left lateral ventricle and through the left foramen Monro into the third ventricle.  There was a mild dilation of the left lateral ventricle approxi-5 mm left to right midline shift.  Findings indicate hypertensive in nature.  CT angiogram of head and neck no large vessel occlusion.  No significant stenosis of the neck.  No aneurysm or visible vascular malformation in the region of the patient's known hemorrhage.  Neurology consulted her Eliquis was reversed discontinued due to [place].  Echocardiogram with ejection fraction of 55 to 60% no wall motion abnormalities.  In regards to patient's chronic left thigh wound status post wound VAC followed by Dr. Lucia Gaskins as well as a wound center Dr. [place].  Therapy evaluations completed due to patient decreased functional ability was admitted for a comprehensive rehab program.   Hospital Course: Kayla Harrison was admitted to rehab 03/19/2021 for inpatient therapies to consist of PT, ST and OT at least three hours five days a week. Past admission physiatrist, therapy team and rehab RN have worked together to provide customized collaborative inpatient rehab.  Pertain to patient's left caudate head ICH with small IVH secondary hypertension and anticoagulation use.  Eliquis has been discontinued she does remain on low-dose aspirin.  Pain managed with use of Tylenol.  Blood pressure  controlled somewhat soft.  She had been on lisinopril discontinued currently maintained on ProAmatine.  History of atrial fibrillation with pacemaker cardiac rate controlled she remains on amiodarone followed by cardiology services.  Synthroid ongoing for hypothyroidism.  Acute on chronic anemia hemoglobin stable 10.3.  Patient with history of NHL with SVC syndrome lung cancer radiation therapy as well as chemotherapy  followed outpatient by Dr.Gudena.  Chronic left thigh wound followed by Dr. Lucia Gaskins of orthopedic services as well as wound care nurse.  Wound VAC discontinued 03/21/2021 she continues with Santyl to the posterior wound, anterior wound still packed with dressing changes as advised.  E. coli UTI she did receive 1 dose of fosfomycin x1 remained afebrile no dysuria or hematuria.   Blood pressures were monitored on TID basis and soft and monitored     Rehab course: During patient's stay in rehab weekly team conferences were held to monitor patient's progress, set goals and discuss barriers to discharge. At admission, patient required moderate assist 8 feet rolling walker moderate assist sit to stand minimal assist supine to sit.  Minimal assist upper body dressing mod assist lower body dressing  Physical exam.  Blood pressure 137/69 pulse 70 temperature 98 respirations 18 oxygen saturation 97% room air Constitutional.  No acute distress HEENT Head.  Normocephalic and atraumatic Eyes.  Pupils round and reactive to light no discharge without nystagmus Neck.  Supple nontender no JVD without thyromegaly Cardiac.  Rate controlled normal pulses Respiratory effort normal no respiratory distress without wheeze Abdomen.  Soft nontender positive bowel sounds without rebound Skin.  Santyl and dressing in place to left thigh wound with surrounding bruising Neurologic.  Arousable makes eye contact with examiner provides name and age.  She does follow simple commands.  She cannot recall her latest stay  while in CIR.  Limited medical historian.  Upper extremities 4/5 proximal to distal, lower extremity 2-3/5 proximal to 4/5 distally.  Decreased fine motor skills right upper extremity without ataxia  He/She  has had improvement in activity tolerance, balance, postural control as well as ability to compensate for deficits. He/She has had improvement in functional use RUE/LUE  and RLE/LLE as well as improvement in awareness.  Patient ambulates 180 feet x 2 rolling walker contact-guard assist.  Ambulates without shoes due to the heavy weight of her shoes.  She does exhibit some impaired balance and needing some cues for safety.  She did show improved endurance as well as performing transfers with contact-guard assist.  ADLs sessions focused on basic ADL standing tolerance balance preparation for improved independence supine to sit head of bed elevated close supervision.  Sit to stand contact-guard rolling walker.  Ambulates rolling walker bed to bathroom wheelchair contact-guard assist.  Completed 3/3 toileting with overall moderate assist.  Due to limited assistance at home skilled nursing facility recommended bed becoming available 04/03/2021.       Disposition: Discharge to skilled nursing facility    Diet: Regular  Special Instructions: No driving smoking or alcohol  Wound care.  Santyl to the left inner thigh wound and a nickel thick layer.  Cover with a saline moistened gauze then dry gauze and foam dressing.  Change daily  Insert 1 inch iodoform gauze into left upper outer wound leaving a tail for easy removal.  Cover with dry gauze and foam dressing.  Change the iodoform gauze and dry gauze each shift.  Change foam dressing every 3 days or as needed soiling  Medications at discharge 1.  Tylenol as needed 2.  Amiodarone 200 mg p.o. twice daily 3.  Aspirin 81 mg p.o. daily 4.  Synthroid 137 mcg daily on Sunday Monday Tuesday Wednesday Thursday Friday 5.  Synthroid 68.5 mcg once per day on  Saturday 6.  ProAmatine 5 mg p.o. twice daily with breakfast and lunch 7.  Protonix 40 mg p.o. daily 8.  MiraLAX daily  hold for loose stools 9.  Crestor 10 mg p.o. daily 10.  Senokot S2 tablets p.o. twice daily hold for loose stools  30-35 minutes were spent completing discharge summary and discharge planning  Discharge Instructions     Ambulatory referral to Neurology   Complete by: As directed    An appointment is requested in approximately: 4 weeks [place]   Ambulatory referral to Physical Medicine Rehab   Complete by: As directed    Follow-up 1 month [facility].  Patient is for skilled nursing facility placement        Contact information for follow-up providers     Erle Crocker, MD Follow up.   Specialty: Orthopedic Surgery Why: Call for appointment Contact information: [place]  [address] [phone]         Deboraha Sprang, MD Follow up.   Specialty: Cardiology Why: Call for appointment Contact information: [address] [place] [address] [phone]         Izora Ribas, MD Follow up.   Specialty: Physical Medicine and Rehabilitation Why: Office to call for appointment Contact information: [address] [address] [phone]              Contact information for after-discharge care     Destination     [facility]erred [facility] .   Service: Skilled Nursing Contact information: [place] [phone]                     Signed: Lavon Paganini Gasquet 04/03/2021, 6:07 AM

## 2021-04-02 NOTE — Progress Notes (Signed)
PROGRESS NOTE   Subjective/Complaints:   No pain with urination or other bladder c/os, no pains Appears brighter , working with SLP on cognition No L hip pain   ROS: Patient denies CP, SOB, N/V/D  Objective:   No results found. No results for input(s): WBC, HGB, HCT, PLT in the last 72 hours.  No results for input(s): NA, K, CL, CO2, GLUCOSE, BUN, CREATININE, CALCIUM in the last 72 hours.    Intake/Output Summary (Last 24 hours) at 04/02/2021 0934 Last data filed at 04/02/2021 0700 Gross per 24 hour  Intake 482 ml  Output --  Net 482 ml      Pressure Injury 03/15/21 Thigh Left;Medial Stage 2 -  Partial thickness loss of dermis presenting as a shallow open injury with a red, pink wound bed without slough. (Active)  03/15/21 0017  Location: Thigh  Location Orientation: Left;Medial  Staging: Stage 2 -  Partial thickness loss of dermis presenting as a shallow open injury with a red, pink wound bed without slough.  Wound Description (Comments):   Present on Admission: Yes    Physical Exam: Vital Signs Blood pressure 121/77, pulse 98, temperature 98.4 F (36.9 C), resp. rate 18, height 5\' 6"  (1.676 m), weight 88.8 kg, SpO2 97 %.  General: No acute distress Mood and affect are appropriate Heart: Regular rate and rhythm no rubs murmurs or extra sounds Lungs: Clear to auscultation, breathing unlabored, no rales or wheezes Abdomen: Positive bowel sounds, soft nontender to palpation, nondistended Extremities: No clubbing, cyanosis, or edema Skin: No evidence of breakdown, no evidence of rash   Neurologic: oriented to self, place, reason she's here. STM deficits. Normal language. Follows commands. Fair insight and awareness. Cranial nerves II through XII intact, motor strength is 4/5 in bilateral deltoid, bicep, tricep, grip, 3-/5 Bilateral hip flexor, knee extensors, ankle dorsiflexor and plantar flexor Sensory exam  normal sensation to light touch in bilateral upper and lower extremities Cerebellar- no dysmetria bilateral FNF Musculoskeletal: tight heel cords, left thigh minimally tender now    Assessment/Plan: 1. Functional deficits which require 3+ hours per day of interdisciplinary therapy in a comprehensive inpatient rehab setting. Physiatrist is providing close team supervision and 24 hour management of active medical problems listed below. Physiatrist and rehab team continue to assess barriers to discharge/monitor patient progress toward functional and medical goals  Care Tool:  Bathing    Body parts bathed by patient: Right arm, Left arm, Chest, Abdomen, Face, Front perineal area, Buttocks, Right upper leg, Left upper leg, Right lower leg, Left lower leg         Bathing assist Assist Level: Contact Guard/Touching assist     Upper Body Dressing/Undressing Upper body dressing   What is the patient wearing?: Pull over shirt, Bra    Upper body assist Assist Level: Supervision/Verbal cueing    Lower Body Dressing/Undressing Lower body dressing      What is the patient wearing?: Pants, Incontinence brief     Lower body assist Assist for lower body dressing: Moderate Assistance - Patient 50 - 74%     Toileting Toileting    Toileting assist Assist for toileting: Minimal Assistance - Patient > 75%  Transfers Chair/bed transfer  Transfers assist     Chair/bed transfer assist level: Contact Guard/Touching assist Chair/bed transfer assistive device: Programmer, multimedia   Ambulation assist      Assist level: Contact Guard/Touching assist Assistive device: Walker-rolling Max distance: 147'   Walk 10 feet activity   Assist     Assist level: Contact Guard/Touching assist Assistive device: Walker-rolling   Walk 50 feet activity   Assist Walk 50 feet with 2 turns activity did not occur: Safety/medical concerns  Assist level: Contact  Guard/Touching assist Assistive device: Walker-rolling    Walk 150 feet activity   Assist Walk 150 feet activity did not occur: Safety/medical concerns         Walk 10 feet on uneven surface  activity   Assist Walk 10 feet on uneven surfaces activity did not occur: Safety/medical concerns         Wheelchair     Assist Will patient use wheelchair at discharge?: Yes (Per PT long term goals)   Wheelchair activity did not occur: Safety/medical concerns         Wheelchair 50 feet with 2 turns activity    Assist    Wheelchair 50 feet with 2 turns activity did not occur: Safety/medical concerns       Wheelchair 150 feet activity     Assist  Wheelchair 150 feet activity did not occur: Safety/medical concerns       Blood pressure 121/77, pulse 98, temperature 98.4 F (36.9 C), resp. rate 18, height 5\' 6"  (1.676 m), weight 88.8 kg, SpO2 97 %.  Medical Problem List and Plan: 1.  Altered mental status secondary to small left caudate head ICH with small IVH secondary hypertension and anticoagulation use             -patient may shower with thigh wounds covered              -ELOS/Goals: SNF pnd -Continue CIR therapies including PT, OT, and SLP,  2.  Impaired mobility: continue SCDs             -antiplatelet therapy:  ASA 81mg  daily cleared by neuro 3. Pain Management: Tylenol as needed 4. Mood: Provide emotional support             -antipsychotic agents: N/A 5. Neuropsych: This patient is not capable of making decisions on her own behalf. 7. Fluids/Electrolytes/Nutrition: Routine in and outs with follow-up chemistries Recheck BMET given issues with orthostasis  8.  Hypertension.  Lisinopril 10 mg daily.  No longer on Lisinopril Monitor with increased mobility Vitals:   04/01/21 1957 04/02/21 0543  BP: 112/67 121/77  Pulse: (!) 103 98  Resp: 18 18  Temp: 97.7 F (36.5 C) 98.4 F (36.9 C)  SpO2: 97% 97%   7/8 still some dizziness with  therapy  -continue TEDS, abd binder Increase midodrine to 5mg - controlled  9.  Atrial fibrillation/pacemaker.  Amiodarone 200 mg twice daily.   Eliquis discontinued due to [place]- now only on ASA 81mg  daily  10.  Hypothyroidism.  Continue Synthroid 11.  Chronic left thigh wound.  Followed by Dr. Lucia Gaskins.  WOC following while in the hospital.  Dressing changes daily and prn.   -Wound VAC discontinued 03/21/2021 -continue santyl to posterior wound, anterior wound still packed 12.  NHL with SVC syndrome/lung cancer radiation therapy as well as chemo.  Followed by Dr.Gudena 13.  Obesity.  Status post lap band surgery.  BMI 32.10.  Dietary follow-up 14.  Acute  on chronic anemia.  Follow-up CBC Hbg improved to 10.3 15- constipation on Senna 2 po bid.  Edited diet order to include fiber rich foods, apples with meals as she likes these 16. Urinary frequency: UC for >100,000 E. Coli. UC pending: ordered fosfomycin x1 since her last UTI responded well to this. F/u susceptibilities pan sensitive, ask pharm to review what additional tx is necessary     LOS: 14 days A FACE TO [place] Shadiyah Wernli 04/02/2021, 9:34 AM

## 2021-04-02 NOTE — Progress Notes (Signed)
Inpatient Rehabilitation Care Coordinator Discharge Note  The overall goal for the admission was met for:   Discharge location: [facility]  Length of Stay: Yes-15 DAYS  Discharge activity level: Yes-MIN ASSIST LEVEL  Home/community participation: Yes  Services provided included: MD, RD, PT, OT, SLP, RN, CM, TR, Pharmacy, and SW  Financial Services: Medicare and Private Insurance: [employer] offered to/list presented to:PT AND SISTER  Follow-up services arranged: Other: SNF  Comments (or additional information):PT DOES NOT HAVE 124/7 CARE AT HOME AN PT AND SISTER DECIDED BEST OPTION IS SNF THEN HOPEFULLY HOME FROM THERE. LEAVING AFTER LUNCH AND [name] HAS BEEN CONTACTED TO MAKE AWARE  Patient/Family verbalized understanding of follow-up arrangements: Yes  Individual responsible for coordination of the follow-up plan: [name] [phone]  Confirmed correct DME delivered: Elease Hashimoto 04/02/2021    Marrianne Sica, [place]

## 2021-04-02 NOTE — Progress Notes (Signed)
Speech Language Pathology Discharge Summary  Patient Details  Name: Kayla Harrison MRN: [id number] Date of Birth: 01/07/1946  Today's Date: 04/02/2021 SLP Individual Time: 7371-0626 SLP Individual Time Calculation (min): 44 min   Skilled Therapeutic Interventions:   Skilled ST services focused on education and cognitive skills. Pt' sister Santiago Glad was present for education. Pt was upset and did not recall novel change of discharge last week to SNF due to previously living alone and continued poor cognitive deficits. SLP provided education and Santiago Glad supported being informed by Education officer, museum about change in d/c and pt was unable to recall events. SLP initiated memory notebook to aid in recall of daily events, pt was able to record today events (PT/ST sessions) in notebook min A fading to supervision A verbal cues by the end of session. Pt completed mildly complex money management/account balancing task ( all numbers rounded) with supervision A verbal cues for problem solving and error awareness. Pt was left in room with call bell within reach and chair alarm set. SLP recommends to continue skilled services.    Patient has met 2 of 4 long term goals.  Patient to discharge at overall Min;Supervision level.  Reasons goals not met:     Clinical Impression/Discharge Summary:   Pt made moderate progress meeting 2 out 4 goals, impacted by shorter than expected d/c to SNF and continued poor awareness of deficit impact on daily living; especially due to short term recall impairments. Pt demonstrated improvement in mildly complex problem solving and error awareness in functional task such as medication and money management. Pt continues to demonstrate deficits in short term recall, benefiting from written aids and is easily frustrated in mildly distracting environments. Pt benefited from skilled ST services in order to maximize functional independence and reduce burden of care, requiring 24 hour supervision at  d/c with continued skilled ST services.  Care Partner:  Caregiver Able to Provide Assistance: Yes  Type of Caregiver Assistance: Physical;Cognitive  Recommendation:  Skilled Nursing facility;24 hour supervision/assistance  Rationale for SLP Follow Up: Maximize functional communication;Maximize cognitive function and independence;Reduce caregiver burden   Equipment: N/A   Reasons for discharge: Discharged from hospital   Patient/Family Agrees with Progress Made and Goals Achieved: Yes    Shakeera Rightmyer  [facility] 04/02/2021, 5:01 PM

## 2021-04-02 NOTE — Plan of Care (Signed)
  Problem: Sit to Stand Goal: LTG:  Patient will perform sit to stand in prep for activites of daily living with assistance level (OT) Description: LTG:  Patient will perform sit to stand in prep for activites of daily living with assistance level (OT) Outcome: Adequate for Discharge   Problem: RH Dressing Goal: LTG Patient will perform upper body dressing (OT) Description: LTG Patient will perform upper body dressing with assist, with/without cues (OT). Outcome: Adequate for Discharge   Problem: RH Bathing Goal: LTG Patient will bathe all body parts with assist levels (OT) Description: LTG: Patient will bathe all body parts with assist levels (OT) Outcome: Not Met (add Reason) Note: Pt was not able to take showers during stay in CIR due to orthostatic vitals, fatigue, and decreased balance. Pt required set up A/spvsn with seated EOB or at sink sponge bathing.   Problem: RH Dressing Goal: LTG Patient will perform lower body dressing w/assist (OT) Description: LTG: Patient will perform lower body dressing with assist, with/without cues in positioning using equipment (OT) Outcome: Not Met (add Reason) Note: Pt requires spvsn-CGA for LB dressing seated and in stance due to orthostatic vitals and balance deficits.   Problem: RH Toileting Goal: LTG Patient will perform toileting task (3/3 steps) with assistance level (OT) Description: LTG: Patient will perform toileting task (3/3 steps) with assistance level (OT)  Outcome: Not Met (add Reason) Note: Pt requires anywhere from spvsn-min A toileting for clothing management in stance due to orthostatic vitals and limited balance.   Problem: RH Toilet Transfers Goal: LTG Patient will perform toilet transfers w/assist (OT) Description: LTG: Patient will perform toilet transfers with assist, with/without cues using equipment (OT) Outcome: Not Met (add Reason) Note: At d/c, pt required spvsn-CGA for ambulatory level toilet transfers due to limited  balance, standing endurance, and orthostatic vitals.   Problem: RH Attention Goal: LTG Patient will demonstrate this level of attention during functional activites (OT) Description: LTG:  Patient will demonstrate this level of attention during functional activites  (OT) Outcome: Not Met (add Reason) Note: Discharging sooner than expected; pt expressed frustration with limited attention during functional tasks in minimally and moderately distracting environments.   Problem: RH Awareness Goal: LTG: Patient will demonstrate awareness during functional activites type of (OT) Description: LTG: Patient will demonstrate awareness during functional activites type of (OT) Outcome: Completed/Met   Problem: RH Simple Meal Prep Goal: LTG Patient will perform simple meal prep w/assist (OT) Description: LTG: Patient will perform simple meal prep with assistance, with/without cues (OT). Outcome: Not Applicable Note: N/A as pt d/c to SNF from CIR.   Problem: RH Light Housekeeping Goal: LTG Patient will perform light housekeeping w/assist (OT) Description: LTG: Patient will perform light housekeeping with assistance, with/without cues (OT). Outcome: Not Applicable Note: N/A as pt d/c to SNF from CIR.   Problem: RH Tub/Shower Transfers Goal: LTG Patient will perform tub/shower transfers w/assist (OT) Description: LTG: Patient will perform tub/shower transfers with assist, with/without cues using equipment (OT) Outcome: Not Applicable Note: Goal n/a as pt completed sponge baths only at d/c due to orthostatic vitals and limited endurance for showering.

## 2021-04-02 NOTE — Progress Notes (Signed)
Physical Therapy Session Note  Patient Details  Name: Kayla Harrison MRN: [id number] Date of Birth: Dec 08, 1945  Today's Date: 04/02/2021 PT Individual Time: 0801-0901; 10:59-11:32 PT Individual Time Calculation (min): 60 min and 33 min  Short Term Goals: Week 1:  PT Short Term Goal 1 (Week 1): Patient will perform bed mobility with S PT Short Term Goal 1 - Progress (Week 1): Met PT Short Term Goal 2 (Week 1): Patient will transfer sit to stand with CGA. PT Short Term Goal 2 - Progress (Week 1): Met PT Short Term Goal 3 (Week 1): Patient will ambulate x 100' with min A. PT Short Term Goal 3 - Progress (Week 1): Progressing toward goal PT Short Term Goal 4 (Week 1): Patient negotiate at least 1 step with min A. PT Short Term Goal 4 - Progress (Week 1): Met Week 2:  PT Short Term Goal 1 (Week 2): STG=LTG due to ELOS  Skilled Therapeutic Interventions/Progress Updates:  Session 1  Pt received supine in bed, no reports of pain. Says she had "lame therapy" over the weekend, disgruntled over not being able to walk around freely in her room. Educated pt on safety in room.   Therapeutic Activity -Supine <> sit EOB w/ min A for trunk support  -Sit <> stand w/ RW and CGA  -Pt ambulated to bathroom w/RW and CGA -Sit <> stand from toilet w/CGA  -Pt ambulated from bathroom to sink w/RW and CGA. Performed grooming at sink independently.  -Sit <> stand from [facility] to RW for dressing. Sup for UE dressing, min A for LE dressing.  -Pt performed car transfer w/RW and CGA. Pt did not require verbal cues.  -Pt ambulated ~18ft in room to sit in recliner.   Pt was left sitting in recliner in room with all needs met.   Session 2  Pt received sitting in recliner with SW discussing DC plans. Pt reports no pain and is agreeable to PT. Sit <> stand w/RW and CGA. Ambulated ~71ft from recliner to [facility] w/CGA.   Gait Training -Pt ambulated 172ft x2 w/RW and CGA. Noted decreased step clearance, step length and  width, and decreased cadence. Pt ambulated without shoes due to the heavy weight of her shoes. Took short sitting rest break in between walks.   Pt presents with impaired gait, balance and safety awareness 2/2 CVA. Pt has improved endurance and gait, performing transfers and ambulating w/CGA. Pt has requested to try walking with her shoes on, educated her on increasing foot clearance w/shoes due to heavy weight of them. Pt was left sitting in recliner in room with all needs met.   Therapy Documentation Precautions:  Precautions Precautions: Fall Precaution Comments: watch for orthostatic hypotension, contact precautions Restrictions Weight Bearing Restrictions: No LUE Weight Bearing: Weight bearing as tolerated LLE Weight Bearing: Weight bearing as tolerated  Vital Signs: Therapy Vitals Temp: 98.4 F (36.9 C) Pulse Rate: 98 Resp: 18 BP: 121/77 Patient Position (if appropriate): Lying Oxygen Therapy SpO2: 97 % O2 Device: Room Air   Therapy/Group: Individual Therapy  Linard Daft E Dovber Ernest Cruzita Lederer Adelin Ventrella, PT, DPT  04/02/2021, 7:33 AM

## 2021-04-03 DIAGNOSIS — I480 Paroxysmal atrial fibrillation: Secondary | ICD-10-CM | POA: Diagnosis not present

## 2021-04-03 DIAGNOSIS — Z7401 Bed confinement status: Secondary | ICD-10-CM | POA: Diagnosis not present

## 2021-04-03 DIAGNOSIS — R41841 Cognitive communication deficit: Secondary | ICD-10-CM | POA: Diagnosis not present

## 2021-04-03 DIAGNOSIS — S71102A Unspecified open wound, left thigh, initial encounter: Secondary | ICD-10-CM | POA: Diagnosis not present

## 2021-04-03 DIAGNOSIS — C8590 Non-Hodgkin lymphoma, unspecified, unspecified site: Secondary | ICD-10-CM | POA: Diagnosis not present

## 2021-04-03 DIAGNOSIS — D649 Anemia, unspecified: Secondary | ICD-10-CM | POA: Diagnosis not present

## 2021-04-03 DIAGNOSIS — R2681 Unsteadiness on feet: Secondary | ICD-10-CM | POA: Diagnosis not present

## 2021-04-03 DIAGNOSIS — W19XXXA Unspecified fall, initial encounter: Secondary | ICD-10-CM | POA: Diagnosis not present

## 2021-04-03 DIAGNOSIS — Z950 Presence of cardiac pacemaker: Secondary | ICD-10-CM | POA: Diagnosis not present

## 2021-04-03 DIAGNOSIS — R4182 Altered mental status, unspecified: Secondary | ICD-10-CM | POA: Diagnosis not present

## 2021-04-03 DIAGNOSIS — R5381 Other malaise: Secondary | ICD-10-CM | POA: Diagnosis not present

## 2021-04-03 DIAGNOSIS — D6489 Other specified anemias: Secondary | ICD-10-CM | POA: Diagnosis not present

## 2021-04-03 DIAGNOSIS — I639 Cerebral infarction, unspecified: Secondary | ICD-10-CM | POA: Diagnosis not present

## 2021-04-03 DIAGNOSIS — I4891 Unspecified atrial fibrillation: Secondary | ICD-10-CM | POA: Diagnosis not present

## 2021-04-03 DIAGNOSIS — I629 Nontraumatic intracranial hemorrhage, unspecified: Secondary | ICD-10-CM | POA: Diagnosis not present

## 2021-04-03 DIAGNOSIS — R55 Syncope and collapse: Secondary | ICD-10-CM | POA: Diagnosis not present

## 2021-04-03 DIAGNOSIS — I6389 Other cerebral infarction: Secondary | ICD-10-CM | POA: Diagnosis not present

## 2021-04-03 DIAGNOSIS — Z1331 Encounter for screening for depression: Secondary | ICD-10-CM | POA: Diagnosis not present

## 2021-04-03 DIAGNOSIS — E038 Other specified hypothyroidism: Secondary | ICD-10-CM | POA: Diagnosis not present

## 2021-04-03 DIAGNOSIS — I10 Essential (primary) hypertension: Secondary | ICD-10-CM | POA: Diagnosis not present

## 2021-04-03 DIAGNOSIS — R2689 Other abnormalities of gait and mobility: Secondary | ICD-10-CM | POA: Diagnosis not present

## 2021-04-03 DIAGNOSIS — R58 Hemorrhage, not elsewhere classified: Secondary | ICD-10-CM | POA: Diagnosis not present

## 2021-04-03 DIAGNOSIS — M6281 Muscle weakness (generalized): Secondary | ICD-10-CM | POA: Diagnosis not present

## 2021-04-03 DIAGNOSIS — R410 Disorientation, unspecified: Secondary | ICD-10-CM | POA: Diagnosis not present

## 2021-04-03 DIAGNOSIS — I619 Nontraumatic intracerebral hemorrhage, unspecified: Secondary | ICD-10-CM | POA: Diagnosis not present

## 2021-04-03 NOTE — Progress Notes (Signed)
Patient discharged off of unit with all belongings via [place]. Discharge papers/instructions explained by physician assistant to patient. Patient has no further questions at time of discharge. No complications noted at this time.  [facility]

## 2021-04-04 ENCOUNTER — Telehealth: Payer: Self-pay | Admitting: *Deleted

## 2021-04-04 ENCOUNTER — Other Ambulatory Visit: Payer: Self-pay

## 2021-04-04 NOTE — Patient Outreach (Signed)
[facility]) Care Management  04/04/2021  Kayla Harrison September 20, 1946 [id number]  [employer] [ACO] Patient: Medicare CMS DCE  Patient screened for post inpatient rehab with an extreme high risk score for unplanned readmission risk and dsposition to assess for potential [facility] service needs for post hospital transition.  Review of patient's medical record reveals patient is transitioned to [facility] for further rehabilitation needs.   Plan: Alerted [facility] PAC RN of disposition and potential follow up needs.  Patient had also been active with [facility] Congregational Nurse program.   For questions contact:   Natividad Brood, RN BSN [facility] Liaison  [phone] business mobile phone Toll free office [phone]  Fax number: [phone] [email or web address]

## 2021-04-04 NOTE — Telephone Encounter (Signed)
CN telephoned [name], [employer] class member of [name] to inquire if she would like to accompany CN to visit [place] this week.  Plan made to visit patient at [facility] around 5 PM on Thursday, 7/14.

## 2021-04-05 DIAGNOSIS — Z950 Presence of cardiac pacemaker: Secondary | ICD-10-CM | POA: Diagnosis not present

## 2021-04-05 DIAGNOSIS — I480 Paroxysmal atrial fibrillation: Secondary | ICD-10-CM | POA: Diagnosis not present

## 2021-04-05 DIAGNOSIS — E038 Other specified hypothyroidism: Secondary | ICD-10-CM | POA: Diagnosis not present

## 2021-04-05 DIAGNOSIS — C8590 Non-Hodgkin lymphoma, unspecified, unspecified site: Secondary | ICD-10-CM | POA: Diagnosis not present

## 2021-04-05 DIAGNOSIS — I6389 Other cerebral infarction: Secondary | ICD-10-CM | POA: Diagnosis not present

## 2021-04-05 DIAGNOSIS — D6489 Other specified anemias: Secondary | ICD-10-CM | POA: Diagnosis not present

## 2021-04-05 DIAGNOSIS — I629 Nontraumatic intracranial hemorrhage, unspecified: Secondary | ICD-10-CM | POA: Diagnosis not present

## 2021-04-05 DIAGNOSIS — R5381 Other malaise: Secondary | ICD-10-CM | POA: Diagnosis not present

## 2021-04-05 DIAGNOSIS — S71102A Unspecified open wound, left thigh, initial encounter: Secondary | ICD-10-CM | POA: Diagnosis not present

## 2021-04-11 ENCOUNTER — Telehealth: Payer: Self-pay | Admitting: *Deleted

## 2021-04-11 ENCOUNTER — Other Ambulatory Visit: Payer: Self-pay | Admitting: *Deleted

## 2021-04-11 ENCOUNTER — Encounter: Payer: Self-pay | Admitting: *Deleted

## 2021-04-11 NOTE — Congregational Nurse Program (Signed)
CN visited patient at [facility]  on 04/05/21 where she is receiving rehab.  CN was accompanied by [name], a church member from [employer]  Patient appeared to have good attitude about being in SNF.  She indicated she had been participating in rehabilitation activities.  She carried on appropriate conversation, but her memory seemed much impaired.  CN encouraged her to participate in rehab with the goal of returning to her home.

## 2021-04-11 NOTE — Patient Outreach (Signed)
Member screened for potential [facility] Care Management needs. Ms. Vessel resides in [facility].   Update received from [place] indicating member wants to return home upon SNF dc. Reports sister/DPR Una Yeomans has concerns with member returning home.   Made aware by [facility] Liaison that member is active with [place] Nurse program.   Telephone call made to Brett Canales, RN with [facility]. Discussed that [place] Management will not be a duplication of services. Manuela Schwartz advised that Probation officer speak with member's sister [name] as she may benefit from the additional follow up.   Will plan outreach to [facility] sister/DPR.  Will also plan to follow for transition plans and needs while member resides in SNF. Will plan ongoing collaboration with Geologist, engineering and SNF SWs.    [name], MSN, RN,BSN [place] Acute Care Coordinator [phone] [facility]) [phone]  (Toll free office)

## 2021-04-11 NOTE — Telephone Encounter (Signed)
CN received telephone call  on 04/11/21 from [facility], sister of patient.  Sister is very distressed after speaking with [place] staff who report that patient is not participating in rehab activities.  Sister also reports that patient is now adamant about leaving [facility] facility after a visit from friend "Wille Glaser"  this past Sunday night who insisted that patient shouldn't be in there.  Sister has health care POA and  needs to make decisions about patient returning home.  She states she will be arriving in [place] on 04/22/21.  Advised her that  J. Church, CN will be able to visit Danielly on 04/13/21.

## 2021-04-11 NOTE — Telephone Encounter (Signed)
CN received phone call from [name] , MSN of [place].  Alerted Ms. [place] Crane that patient's sister, [name] who lives in [place] could benefit from conversation with her in regard to patient leaving [employer].  Advised Ms. Hall that CN would telephone [name] the sister and give her Ms. Hall's phone number so they could communicate.  Also shared Jasmyn Picha information with Ms. Nevada Crane.

## 2021-04-11 NOTE — Patient Outreach (Addendum)
THN Post- Acute Care Coordinator follow up. Mrs Kayla Harrison resides in [facility].   [place] Nurse advised writer to contact member's sister/DPR Kayla Harrison to discuss potential [facility] Care Management services.   Telephone call made to sister/DPR [facility] [phone]. Patient identifiers confirmed. Kayla Harrison states member is from home alone. States member had multiple falls and multiple hospitalizations. Member lived in a handicap accessible [place]. Has a medical alert system. Also discussed if security cameras in the home was a possibility due to member's high risk for falls.   Kayla Harrison states member is adamant about returning home. States member does not qualify for long term care and does not qualify for Medicaid. Kayla Harrison is POA and plans to discuss logistics and what member can afford with member's attorney. Member can get confused or forgetful at times.  Next care plan meeting with facility is on August 8th. Encouraged Kayla Harrison to call different private duty agencies to gather details of cost, services provided, and hours. Kayla Harrison states she has requested agency list to be emailed by the facility. States she thinks it will be a good idea to start gathering information now.   Discussed that writer will contact her next week to see how things are coming along. Of note, Kayla Harrison lives in [place] and member does not have family in the area. States [employer] church family has been very supportive and helpful to [name].   Discussed [facility] Care Management services. Provided Kayla Harrison with writer's telephone number and will email [place] Management and writer's contact information along with [facility] website information.  Will continue to follow while member resides in SNF.    Kayla Rolling, MSN, RN,BSN [place] Acute Care Coordinator [phone] [facility]) [phone]  (Toll free office)

## 2021-04-11 NOTE — Telephone Encounter (Signed)
Kayla Harrison, patient's sister emailed CN on 04/09/21 stating that the staff at [facility] reports that patient is not participating in her rehab routine and still cognitive barriers when talking with patient.  Mary requests that CN pay a visit to patient.  CN attempted to email Kayla Harrison and request she give permission for CNs to communicate with medical staff at [facility].  Also stated in email CN had visited  patient on 7/14 and we would be glad to visit patient  again this week.  The email bounced back from email [place] provided.  CN telephoned Kayla Harrison as well, Kayla Harrison did not answer and CN left message with same information in email requesting Kayla Harrison return call.

## 2021-04-11 NOTE — Telephone Encounter (Signed)
Caroleen Hamman, friend of patient telephoned CN  on 7/10 to request that CN visit patient at [facility] while Ms. Tamala Julian is on vacation.  I advised Ms. Tamala Julian that I could go visit patient during the week of 7/11 -7/15.

## 2021-04-11 NOTE — Telephone Encounter (Signed)
CN discussed services provided by [facility]  with supervisor Jalene Mullet, MSN and how they could benefit patient.

## 2021-04-13 DIAGNOSIS — S71102A Unspecified open wound, left thigh, initial encounter: Secondary | ICD-10-CM | POA: Diagnosis not present

## 2021-04-13 DIAGNOSIS — I629 Nontraumatic intracranial hemorrhage, unspecified: Secondary | ICD-10-CM | POA: Diagnosis not present

## 2021-04-13 DIAGNOSIS — R5381 Other malaise: Secondary | ICD-10-CM | POA: Diagnosis not present

## 2021-04-13 DIAGNOSIS — I480 Paroxysmal atrial fibrillation: Secondary | ICD-10-CM | POA: Diagnosis not present

## 2021-04-13 DIAGNOSIS — W19XXXA Unspecified fall, initial encounter: Secondary | ICD-10-CM | POA: Diagnosis not present

## 2021-04-13 DIAGNOSIS — Z1331 Encounter for screening for depression: Secondary | ICD-10-CM | POA: Diagnosis not present

## 2021-04-14 ENCOUNTER — Telehealth: Payer: Self-pay | Admitting: *Deleted

## 2021-04-14 NOTE — Telephone Encounter (Signed)
Patient's sister , Santiago Glad requested hlep on how to obtain a hospital bed for patient's home once she is discharged from [facility].  CN shared with sister information learned from A. [place] Crane, MSN.   Social workers at the facility will arrange home health and any equipment needs and this should be arranged before patient leaves the facility.

## 2021-04-16 ENCOUNTER — Telehealth: Payer: Self-pay | Admitting: *Deleted

## 2021-04-16 NOTE — Telephone Encounter (Signed)
Kayla Harrison, CN reports that she spoke with Kayla Harrison, sister of patient  who will be coming to [facility] 04/20/21 and will be staying for at least 2 weeks to help patient transition home from [facility] .  Discharge planning meeting at [facility] is scheduled for August 8th.  Kayla Harrison will attend the meeting along with patient, OT, PT, social worker and other appropriate medical personnel.

## 2021-04-19 ENCOUNTER — Other Ambulatory Visit: Payer: Self-pay | Admitting: *Deleted

## 2021-04-19 NOTE — Patient Outreach (Signed)
THN Post- Acute Care Coordinator follow up.   Mrs. Turcott resides in [facility]. Telephone  call made to [facility] [phone] to follow up on transition plans. No answer. HIPAA compliant voicemail message requesting return call.   Will continue to follow while member resides in SNF. Will plan outreach again at later time.    [name], MSN, RN,BSN [place] Acute Care Coordinator [phone] [facility]) [phone]  (Toll free office)

## 2021-04-24 ENCOUNTER — Other Ambulatory Visit: Payer: Self-pay | Admitting: *Deleted

## 2021-04-24 DIAGNOSIS — I639 Cerebral infarction, unspecified: Secondary | ICD-10-CM | POA: Diagnosis not present

## 2021-04-24 DIAGNOSIS — I4891 Unspecified atrial fibrillation: Secondary | ICD-10-CM | POA: Diagnosis not present

## 2021-04-24 DIAGNOSIS — D649 Anemia, unspecified: Secondary | ICD-10-CM | POA: Diagnosis not present

## 2021-04-24 DIAGNOSIS — I629 Nontraumatic intracranial hemorrhage, unspecified: Secondary | ICD-10-CM | POA: Diagnosis not present

## 2021-04-24 NOTE — Patient Outreach (Signed)
THN Post- Acute Care Coordinator follow up. Member screened for potential [facility] Care Management needs.   Ms. Ballester remains in [facility]. Previously spoke with member's sister [name] (HCPOA/DPR). Telephone call made to [facility] [phone] to follow up on transition plans. No answer. HIPAA compliant voicemail message left to request return call.   Will continue to follow.    [name], MSN, RN,BSN [place] Acute Care Coordinator [phone] [facility]) [phone]  (Toll free office)

## 2021-04-25 DIAGNOSIS — Z8616 Personal history of COVID-19: Secondary | ICD-10-CM | POA: Diagnosis not present

## 2021-04-25 DIAGNOSIS — Z7901 Long term (current) use of anticoagulants: Secondary | ICD-10-CM | POA: Diagnosis not present

## 2021-04-25 DIAGNOSIS — S71102A Unspecified open wound, left thigh, initial encounter: Secondary | ICD-10-CM | POA: Diagnosis not present

## 2021-04-25 DIAGNOSIS — I4819 Other persistent atrial fibrillation: Secondary | ICD-10-CM | POA: Diagnosis not present

## 2021-04-25 DIAGNOSIS — Z85850 Personal history of malignant neoplasm of thyroid: Secondary | ICD-10-CM | POA: Diagnosis not present

## 2021-04-25 DIAGNOSIS — Z602 Problems related to living alone: Secondary | ICD-10-CM | POA: Diagnosis not present

## 2021-04-25 DIAGNOSIS — I110 Hypertensive heart disease with heart failure: Secondary | ICD-10-CM | POA: Diagnosis not present

## 2021-04-25 DIAGNOSIS — Z9181 History of falling: Secondary | ICD-10-CM | POA: Diagnosis not present

## 2021-04-25 DIAGNOSIS — I951 Orthostatic hypotension: Secondary | ICD-10-CM | POA: Diagnosis not present

## 2021-04-25 DIAGNOSIS — Z853 Personal history of malignant neoplasm of breast: Secondary | ICD-10-CM | POA: Diagnosis not present

## 2021-04-25 DIAGNOSIS — I69198 Other sequelae of nontraumatic intracerebral hemorrhage: Secondary | ICD-10-CM | POA: Diagnosis not present

## 2021-04-25 DIAGNOSIS — Z96611 Presence of right artificial shoulder joint: Secondary | ICD-10-CM | POA: Diagnosis not present

## 2021-04-25 DIAGNOSIS — T8133XA Disruption of traumatic injury wound repair, initial encounter: Secondary | ICD-10-CM | POA: Diagnosis not present

## 2021-04-25 DIAGNOSIS — E669 Obesity, unspecified: Secondary | ICD-10-CM | POA: Diagnosis not present

## 2021-04-25 DIAGNOSIS — Z85118 Personal history of other malignant neoplasm of bronchus and lung: Secondary | ICD-10-CM | POA: Diagnosis not present

## 2021-04-25 DIAGNOSIS — Z8673 Personal history of transient ischemic attack (TIA), and cerebral infarction without residual deficits: Secondary | ICD-10-CM | POA: Diagnosis not present

## 2021-04-25 DIAGNOSIS — I5032 Chronic diastolic (congestive) heart failure: Secondary | ICD-10-CM | POA: Diagnosis not present

## 2021-04-25 DIAGNOSIS — Z8572 Personal history of non-Hodgkin lymphomas: Secondary | ICD-10-CM | POA: Diagnosis not present

## 2021-04-25 DIAGNOSIS — I4892 Unspecified atrial flutter: Secondary | ICD-10-CM | POA: Diagnosis not present

## 2021-04-25 DIAGNOSIS — Z6833 Body mass index (BMI) 33.0-33.9, adult: Secondary | ICD-10-CM | POA: Diagnosis not present

## 2021-04-25 DIAGNOSIS — M6281 Muscle weakness (generalized): Secondary | ICD-10-CM | POA: Diagnosis not present

## 2021-04-27 ENCOUNTER — Other Ambulatory Visit: Payer: Self-pay | Admitting: *Deleted

## 2021-04-27 DIAGNOSIS — R269 Unspecified abnormalities of gait and mobility: Secondary | ICD-10-CM | POA: Diagnosis not present

## 2021-04-27 DIAGNOSIS — Z8679 Personal history of other diseases of the circulatory system: Secondary | ICD-10-CM | POA: Diagnosis not present

## 2021-04-27 DIAGNOSIS — M6281 Muscle weakness (generalized): Secondary | ICD-10-CM | POA: Diagnosis not present

## 2021-04-27 DIAGNOSIS — I69198 Other sequelae of nontraumatic intracerebral hemorrhage: Secondary | ICD-10-CM | POA: Diagnosis not present

## 2021-04-27 DIAGNOSIS — Z7901 Long term (current) use of anticoagulants: Secondary | ICD-10-CM | POA: Diagnosis not present

## 2021-04-27 DIAGNOSIS — S71102A Unspecified open wound, left thigh, initial encounter: Secondary | ICD-10-CM | POA: Diagnosis not present

## 2021-04-27 DIAGNOSIS — I951 Orthostatic hypotension: Secondary | ICD-10-CM | POA: Diagnosis not present

## 2021-04-27 DIAGNOSIS — S71102S Unspecified open wound, left thigh, sequela: Secondary | ICD-10-CM | POA: Diagnosis not present

## 2021-04-27 DIAGNOSIS — I480 Paroxysmal atrial fibrillation: Secondary | ICD-10-CM | POA: Diagnosis not present

## 2021-04-27 DIAGNOSIS — I4819 Other persistent atrial fibrillation: Secondary | ICD-10-CM | POA: Diagnosis not present

## 2021-04-27 DIAGNOSIS — M21371 Foot drop, right foot: Secondary | ICD-10-CM | POA: Diagnosis not present

## 2021-04-27 DIAGNOSIS — T8133XA Disruption of traumatic injury wound repair, initial encounter: Secondary | ICD-10-CM | POA: Diagnosis not present

## 2021-04-27 NOTE — Patient Outreach (Signed)
THN Post- Acute Care Coordinator follow up. Verified in [place] (Patient Kayla Harrison) that member transitioned home from [facility] on 04/24/21.  Telephone call made to [name] [phone]. Patient identifiers confirmed. [name] states she has settled in nicely at home. Her sister Kayla Harrison (who Probation officer spoke with previously) was on speaker phone.   Kayla Harrison states they have all the resources they need at this time. States member has personal caregiver assistance and [facility] for therapy. States PCP appointment was yesterday 04/26/21. Kayla Harrison (sister/DPR) states she has writer's number to contact if needed in the future. Denies having any [facility] Care Management needs.    Kayla Rolling, MSN, RN,BSN [place] Acute Care Coordinator [phone] [facility]) [phone]  (Toll free office)

## 2021-04-29 NOTE — Progress Notes (Signed)
Electrophysiology Office Note Date: 04/30/2021  ID:  Kayla Harrison, DOB 04-02-1946, MRN [id number]  PCP: [name], MD Primary Cardiologist: [name], MD Electrophysiologist: [name], MD   CC: Post hospital follow up.   Kayla Harrison is a 75 y.o. female seen today for [name], MD for post hospital follow up.    Patient with with history of thyroid cancer, hypothyroidism, NHL/SVC syndrome /lung cancer with radiation therapy as well as chemotherapy 2018 followed by Dr. Lindi Adie, pacemaker placement for atrial fibrillation maintained on Eliquis followed by Dr. Caryl Comes, obesity status post lap band surgery, hypertension, hyperlipidemia, obesity with BMI 33.64, recurrent falls.     Patient had admission to CIR 12/22/2020 to 01/09/2021 for functional deficits after left coronoid process fracture and left hip labral hamstring tendon tear with associated large hematoma requiring I&D and wound VAC.   She returned to the ER 01/10/2021 for open wound to left thigh that were bleeding from prior hematoma I&D site without active bleeding at time of visit, and stable Hgb. She was discharged back to home.   Presented 02/05/2021 with purulent malodorous drainage x1 week from left hip wound dehiscence. Labs on admission relatively stable aside from glucose 103, INR 1.6, lactic acid 1.1, blood cultures no growth to date, hemoglobin 11.2, WBC 8000.     Patient underwent I&D of L thigh deep abscess/hematoma excisional debridement of necrotic skin subcutaneous tissue muscle and fascia with revision of closure/drain placement 02/06/2021 per Dr. Melony Overly with Wound VAC placement Wound cultures after I&D E. coli plus E Faecalis and treated with > 2 weeks of ABX with ID assistance. It was noted to the central part of her wound was a bit dehisced no longer held tightly with suture material consideration being made for revision with washout.    Her chronic anticoagulation with Eliquis for  atrial fibrillation has been on hold. She does continue on amiodarone at this time.  Close monitoring of blood pressure remaining on ProAmatine as well as Florinef. She was admitted back to CIR 5/24 for continued rehabilitation.   She presented again 03/14/2021 with altered mental status found down after she reportedly slipped off the bed.  No loss of consciousness.  Admission chemistries unremarkable hemoglobin 10.6 SARS coronavirus negative CK 41.  Cranial CT scan showed acute intracranial hemorrhage extending from the region of the left caudothalamic groove into the left lateral ventricle and through the left foramen Monro into the third ventricle.  There was a mild dilation of the left lateral ventricle approxi-5 mm left to right midline shift.  Findings indicate hypertensive in nature.  CT angiogram of head and neck no large vessel occlusion.  No significant stenosis of the neck.  No aneurysm or visible vascular malformation in the region of the patient's known hemorrhage.  Neurology consulted her Eliquis was reversed discontinued due to [place].  Echocardiogram with ejection fraction of 55 to 60% no wall motion abnormalities.  In regards to patient's chronic left thigh wound status post wound VAC followed by Dr. Lucia Gaskins as well as a wound center Dr. Heber [place].  Therapy evaluations completed due to patient decreased functional ability was admitted back to CIR 6/27 through 7/12.   Since discharge from hospital the patient reports doing OK. She came home from [facility] .  she denies chest pain, palpitations, dyspnea, PND, orthopnea, nausea, vomiting, dizziness, syncope, edema, weight gain, or early satiety.  Device History: Medtronic Dual Chamber PPM implanted 2014 for SSS  Past Medical History:  Diagnosis Date   Anemia    Arthritis    osteoarthritis - knees and right shoulder   Blood transfusion without reported diagnosis    Breast cancer ([place])    Dr Margot Chimes, total thyroidectomy- 1999- for cancer    Brucellosis 1964   Chronic bilateral pleural effusions    Colon polyp    Dr Earlean Shawl   Complication of anesthesia    Ketamine produces LSD reaction, bright colored nightmarish experience    Dyslipidemia    Endometriosis    Fibroids    H/O pleural effusion    s/p thoracentesis w 3273ml withdrawn   Hepatitis    [place] ?hepatitis    History of dysphagia    due to radiation therapy   History of hiatal hernia    small noted on PET scan   Hypertension    Hypothyroidism    Lung cancer, lower lobe ([place]) 01/2017   radiation RX completed 03/04/17; will start chemo 6/27, pt unaware of lung cancer   Morbid obesity ([place])    Status post lap band surgery   Nephrolithiasis    Non Hodgkin's lymphoma ([place])    on chemotherapy   Persistent atrial fibrillation ([place])    a. s/p PVI 2008 b. s/p convergent ablation 6712 complicated by bradycardia requiring pacemaker implant   Personal history of radiation therapy    Presence of permanent cardiac pacemaker    Rotator cuff tear    Right   Stroke ([place])    2003- [place] x2   SVC syndrome    with lung mass and non hodgkins lymphoma   Thyroid cancer ([place]) 2000   Past Surgical History:  Procedure Laterality Date   ABDOMINAL HYSTERECTOMY  1983   afib ablation     a. 2008 PVI b. 2014 convergent ablation   APPENDECTOMY     BONE MARROW BIOPSY  02/21/2017   BREAST LUMPECTOMY Left 2010   [place]     2015- negative   CARDIOVERSION  10/09/2012   Procedure: CARDIOVERSION;  Surgeon: Minus Breeding, MD;  Location: [place];  Service: Cardiovascular;  Laterality: N/A;   CARDIOVERSION  10/09/2012   Procedure: CARDIOVERSION;  Surgeon: Minus Breeding, MD;  Location[facility];  Service: Cardiovascular;  Laterality: N/A;  [name] gave the ok to add pt to the add on , but we must check to find out if the can add pt on at [phone])   CARDIOVERSION N/A 11/20/2012   Procedure: CARDIOVERSION;  Surgeon:  Fay Records, MD;  Location: [place];  Service: Cardiovascular;  Laterality: N/A;   CARDIOVERSION N/A 07/18/2017   Procedure: CARDIOVERSION;  Surgeon: Thayer Headings, MD;  Location: [place];  Service: Cardiovascular;  Laterality: N/A;   CARDIOVERSION N/A 10/03/2017   Procedure: CARDIOVERSION;  Surgeon: Sanda [place], MD;  Location: [place] [facility]OSCOPY;  Service: Cardiovascular;  Laterality: N/A;   CARDIOVERSION N/A 01/07/2018   Procedure: CARDIOVERSION;  Surgeon: Nahser, Wonda Cheng, MD;  Location: [facility] ENDOSCOPY;  Service: Cardiovascular;  Laterality: N/A;   CARDIOVERSION N/A 12/10/2019   Procedure: CARDIOVERSION;  Surgeon: Buford Dresser, MD;  Location: [facility]OSCOPY;  Service: Cardiovascular;  Laterality: N/A;   CARDIOVERSION N/A 03/09/2021   Procedure: CARDIOVERSION;  Surgeon: Pixie Casino, MD;  Location: [place];  Service: Cardiovascular;  Laterality: N/A;   CHOLECYSTECTOMY     COLONOSCOPY W/ POLYPECTOMY     Dr Earlean Shawl   CYSTOSCOPY N/A 02/06/2015   Procedure: CYSTOSCOPY;  Surgeon: Kathie [place], MD;  [facility]: [facility];  Service: Urology;  Laterality: N/A;   CYSTOSCOPY W/ RETROGRADES Left 11/17/2017   Procedure: CYSTOSCOPY WITH RETROGRADE /PYELOGRAM/;  Surgeon: Kathie [place], MD;  [facility];  Service: Urology;  Laterality: Left;   CYSTOSCOPY WITH RETROGRADE PYELOGRAM, URETEROSCOPY AND STENT PLACEMENT Right 02/06/2015   Procedure: RETROGRADE PYELOGRAM, RIGHT URETEROSCOPY STENT PLACEMENT;  Surgeon: Kathie [place], MD;  [facility];  Service: Urology;  Laterality: Right;   CYSTOSCOPY WITH RETROGRADE PYELOGRAM, URETEROSCOPY AND STENT PLACEMENT Right 03/07/2017   Procedure: CYSTOSCOPY WITH RIGHT RETROGRADE PYELOGRAM,RIGHT  URETEROSCOPYLASER LITHOTRIPSY  AND STENT PLACEMENT AND STONE BASKETRY;  Surgeon: Kathie [place], MD;  Location: [place];  Service: Urology;  Laterality: Right;   EYE SURGERY     cataract surgery   fatty mass removal  1999   pubic area    HOLMIUM LASER APPLICATION N/A 4/62/8638   Procedure: HOLMIUM LASER APPLICATION;  Surgeon: Kathie [place], MD;  [facility];  Service: Urology;  Laterality: N/A;   HOLMIUM LASER APPLICATION Right 1/77/1165   Procedure: HOLMIUM LASER APPLICATION;  Surgeon: Kathie [place], MD;  Location: [facility];  Service: Urology;  Laterality: Right;   HOLMIUM LASER APPLICATION Left 7/90/3833   Procedure: HOLMIUM LASER APPLICATION;  Surgeon: Kathie [place], MD;  [facility];  Service: Urology;  Laterality: Left;   I & D EXTREMITY Left 12/19/2020   Procedure: IRRIGATION AND DEBRIDEMENT OF LEFT HIP HEMATOMA WITH APPLICATION OF WOUND VAC;  Surgeon: Erle Crocker, MD;  Location: [place];  Service: Orthopedics;  Laterality: Left;   I & D EXTREMITY Left 02/06/2021   Procedure: IRRIGATION AND DEBRIDEMENT DEEP ABCESS LEFT THIGH, SECONDARY CLOSURE OF WOUND DEHISCENCE;  Surgeon: Erle Crocker, MD;  Location: [place];  Service: Orthopedics;  Laterality: Left;   IR FLUORO GUIDE PORT INSERTION RIGHT  02/24/2017   IR NEPHROSTOMY PLACEMENT RIGHT  11/17/2017   IR PATIENT EVAL TECH 0-60 MINS  03/11/2017   IR REMOVAL TUN ACCESS W/ PORT W/O FL MOD SED  04/20/2018   IR US GUIDE VASC ACCESS RIGHT  02/24/2017   KNEE ARTHROSCOPY     bilateral   LAPAROSCOPIC GASTRIC BANDING  07/10/2010   LAPAROSCOPIC GASTRIC BANDING     Laparoscopic adjustable banding APS System with posterior hiatal hernia, 2 suture.   LAPAROTOMY     for ruptured ovary and ovarian artery    NEPHROLITHOTOMY Right 11/17/2017   Procedure: NEPHROLITHOTOMY PERCUTANEOUS;  Surgeon: Kathie Rhodes, MD;  [facility];  Service: Urology;  Laterality: Right;   PACEMAKER INSERTION  03/10/2013   MDT dual chamber PPM   POCKET REVISION N/A 12/08/2013   Procedure: POCKET REVISION;  Surgeon: Deboraha Sprang, MD;  Location: [facility] CATH LAB;  Service: Cardiovascular;  Laterality: N/A;   PORTA CATH INSERTION     REVERSE SHOULDER ARTHROPLASTY Right 05/14/2018    Procedure: RIGHT REVERSE SHOULDER ARTHROPLASTY;  Surgeon: Tania Ade, MD;  Location: [place];  Service: Orthopedics;  Laterality: Right;   REVERSE SHOULDER REPLACEMENT Right 05/14/2018   RIGHT HEART CATH N/A 07/21/2019   Procedure: RIGHT HEART CATH;  Surgeon: Larey Dresser, MD;  Location: [place] CV LAB;  Service: Cardiovascular;  Laterality: N/A;   TEE WITH CARDIOVERSION  09/22/2017   TEE WITHOUT CARDIOVERSION N/A 10/03/2017   Procedure: TRANSESOPHAGEAL ECHOCARDIOGRAM (TEE);  Surgeon: Sanda [place], MD;  Location: [place];  Service: Cardiovascular;  Laterality: N/A;   TEE WITHOUT CARDIOVERSION N/A 08/04/2019   Procedure: TRANSESOPHAGEAL  ECHOCARDIOGRAM (TEE);  Surgeon: Larey Dresser, MD;  Location: [facility] ENDOSCOPY;  Service: Cardiovascular;  Laterality: N/A;   TEE WITHOUT CARDIOVERSION N/A 12/10/2019   Procedure: TRANSESOPHAGEAL ECHOCARDIOGRAM (TEE);  Surgeon: Buford Dresser, MD;  Location: [facility] ENDOSCOPY;  Service: Cardiovascular;  Laterality: N/A;   TEE WITHOUT CARDIOVERSION N/A 03/09/2021   Procedure: TRANSESOPHAGEAL ECHOCARDIOGRAM (TEE);  Surgeon: Pixie Casino, MD;  Location: [place];  Service: Cardiovascular;  Laterality: N/A;   THYROIDECTOMY  1998   Dr Margot Chimes   TONSILLECTOMY     TOTAL KNEE ARTHROPLASTY  04/13/2012   Procedure: TOTAL KNEE ARTHROPLASTY;  Surgeon: Rudean Haskell, MD;  Location: [place];  Service: Orthopedics;  Laterality: Right;   VIDEO BRONCHOSCOPY WITH ENDOBRONCHIAL ULTRASOUND N/A 02/07/2017   Procedure: VIDEO BRONCHOSCOPY WITH ENDOBRONCHIAL ULTRASOUND;  Surgeon: Marshell Garfinkel, MD;  Location: [place];  Service: Pulmonary;  Laterality: N/A;    Current Outpatient Medications  Medication Sig Dispense Refill   acetaminophen (TYLENOL) 325 MG tablet Take 2 tablets (650 mg total) by mouth every 4 (four) hours as needed for mild pain (or temp > 37.5 C (99.5 F)).     amiodarone (PACERONE) 200 MG tablet Take 1 tablet (200 mg total) by mouth in the  morning and at bedtime.     aspirin EC 81 MG EC tablet Take 1 tablet (81 mg total) by mouth daily. Swallow whole. 30 tablet 11   collagenase (SANTYL) ointment Apply topically daily. Apply as directed 15 g 0   levothyroxine (SYNTHROID) 137 MCG tablet Take 68.5-137 mcg by mouth See admin instructions. Taking 137 mcg tablet daily except on Saturday taking 1/2 tablet     levothyroxine (SYNTHROID) 150 MCG tablet Take 150 mcg by mouth every morning.     metoprolol tartrate (LOPRESSOR) 25 MG tablet as needed. Take 1/2 tab for HR >100     midodrine (PROAMATINE) 5 MG tablet Take 1 tablet (5 mg total) by mouth 2 (two) times daily with breakfast and lunch.     pantoprazole (PROTONIX) 40 MG tablet Take 1 tablet (40 mg total) by mouth daily.     polyethylene glycol (MIRALAX / GLYCOLAX) 17 g packet Take 17 g by mouth daily. 14 each 0   rosuvastatin (CRESTOR) 10 MG tablet Take 1 tablet (10 mg total) by mouth daily. Mondays & Thursdays     senna-docusate (SENOKOT-S) 8.6-50 MG tablet Take 2 tablets by mouth 2 (two) times daily. (Patient taking differently: Take 2 tablets by mouth as needed for mild constipation or moderate constipation.)     No current facility-administered medications for this visit.    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[employer]    Employer: RETIRED  Tobacco Use   Smoking status: Never   Smokeless tobacco: Never  Vaping Use   Vaping Use: Never used  Substance and Sexual Activity   Alcohol use: No    Comment: none since 1990   Drug use: Never   Sexual activity: Not Currently    Birth control/protection:  Surgical  Other Topics Concern   Not on file  Social History Narrative   ** Merged History Encounter **  SINGLE  NO CHILDREN NEVER SMOKED EXERCISE 3 X WK RETIRED NO CAFFEINE      Social Determinants of Health   Financial Resource Strain: Not on file  Food Insecurity: Not on file  Transportation Needs: Not on file  Physical Activity: Not on file  Stress: Not on file  Social Connections: Not on file  Intimate Partner Violence: Not on file    Family History: Family History  Problem Relation Age of Onset   Heart disease Father 83       MI $R'@autopsy'Zf$    Colon cancer Father        COLON   Heart attack Father    Other Mother        temporal arteritis    Diabetes Sister    Diabetes Brother    Diabetes Paternal Aunt    Diabetes Paternal Grandmother    Esophageal cancer Neg Hx    Inflammatory bowel disease Neg Hx    Liver disease Neg Hx    Pancreatic cancer Neg Hx    Stomach cancer Neg Hx      Review of Systems: All other systems reviewed and are otherwise negative except as noted above.  Physical Exam: Vitals:   04/30/21 0925  BP: 112/78  Pulse: (!) 104  SpO2: 97%  Weight: 199 lb 12.8 oz (90.6 kg)  Height: $Remove'5\' 6"'AtMOdxj$  (1.676 m)     GEN- The patient is well appearing, alert and oriented x 3 today.   HEENT: normocephalic, atraumatic; sclera clear, conjunctiva pink; hearing intact; oropharynx clear; neck supple  Lungs- Clear to ausculation bilaterally, normal work of breathing.  No wheezes, rales, rhonchi Heart- Regular rate and rhythm, no murmurs, rubs or gallops  GI- soft, non-tender, non-distended, bowel sounds present  Extremities- no clubbing or cyanosis. No edema MS- no significant deformity or atrophy Skin- warm and dry, no rash or lesion; PPM pocket well healed Psych- euthymic mood, full affect Neuro- strength and sensation are intact  PPM Interrogation- reviewed in detail today,  See PACEART report  EKG:  EKG is not ordered today.  Recent  Labs: 12/19/2020: TSH 1.587 01/07/2021: B Natriuretic Peptide 157.0 03/19/2021: Magnesium 1.9 03/20/2021: ALT 36; Hemoglobin 10.3; Platelets 205 03/26/2021: BUN 17; Creatinine, Ser 0.89; Potassium 4.8; Sodium 133   Wt Readings from Last 3 Encounters:  04/30/21 199 lb 12.8 oz (90.6 kg)  03/24/21 195 lb 12.3 oz (88.8 kg)  03/15/21 198 lb 13.7 oz (90.2 kg)     Other studies Reviewed: Additional studies/ records that were reviewed today include: Previous EP office notes, Previous remote checks, Most recent labwork.   Assessment and Plan:  Persistent atrial fibrillation Amiodarone therapy - longstanding SND s/p MDT PPM   Remains in Atrial fibrillation and likely poor candidate for sinus, at least for the time being with chronic anticoagulation on hold. Rates overall well controlled on amiodarone BB/CCB use has been precluded by orthostatic hypotension requiring florinef and midodrine   Thigh hematoma -> infection -> I&D 02/06/21 with wound VAC and drain placement Has finished with wound vac Following with HH     Orthostatic hypotension Stable. No further lightheadedness or.diz.     Anemia Hypothroidism NHL with SVC syndrome/lung cancer s/p radiation and chemo by Dr. Lindi Adie HLD Poor vascular access Hypokalemia, mild  - Update labs  Intracerebral hemorrhage  Very complicated patient. Remains off [place] due to thigh hematoma, and more recently ICH.  She is in AF today and by device appears to have been since Early July. Not candidate  for sinus currently, May need to stop amiodarone and gently add BB.  Will discuss with Dr. Caryl Comes today and have follow up in office with him where available in the next few months.   Current medicines are reviewed at length with the patient today.   The patient does not have concerns regarding her medicines.  The following changes were made today:  none  Labs/ tests ordered today include:  No orders of the defined types were placed in this  encounter.  Disposition:   Follow up with Dr. Caryl Comes in 2-3 months, sooner pending discussion.    Jacalyn Lefevre, PA-C  04/30/2021 9:32 AM  [facility] [address] [place] [address] [phone] (office) [phone] (fax)

## 2021-04-30 ENCOUNTER — Other Ambulatory Visit: Payer: Self-pay

## 2021-04-30 ENCOUNTER — Ambulatory Visit (INDEPENDENT_AMBULATORY_CARE_PROVIDER_SITE_OTHER): Payer: Medicare Other | Admitting: Student

## 2021-04-30 ENCOUNTER — Encounter: Payer: Self-pay | Admitting: Student

## 2021-04-30 VITALS — BP 112/78 | HR 104 | Ht 66.0 in | Wt 199.8 lb

## 2021-04-30 DIAGNOSIS — I951 Orthostatic hypotension: Secondary | ICD-10-CM | POA: Diagnosis not present

## 2021-04-30 DIAGNOSIS — D6869 Other thrombophilia: Secondary | ICD-10-CM

## 2021-04-30 DIAGNOSIS — I4819 Other persistent atrial fibrillation: Secondary | ICD-10-CM

## 2021-04-30 DIAGNOSIS — I871 Compression of vein: Secondary | ICD-10-CM | POA: Diagnosis not present

## 2021-04-30 DIAGNOSIS — M6281 Muscle weakness (generalized): Secondary | ICD-10-CM | POA: Diagnosis not present

## 2021-04-30 DIAGNOSIS — I69198 Other sequelae of nontraumatic intracerebral hemorrhage: Secondary | ICD-10-CM | POA: Diagnosis not present

## 2021-04-30 DIAGNOSIS — Z950 Presence of cardiac pacemaker: Secondary | ICD-10-CM | POA: Diagnosis not present

## 2021-04-30 DIAGNOSIS — I610 Nontraumatic intracerebral hemorrhage in hemisphere, subcortical: Secondary | ICD-10-CM | POA: Diagnosis not present

## 2021-04-30 DIAGNOSIS — T8133XA Disruption of traumatic injury wound repair, initial encounter: Secondary | ICD-10-CM | POA: Diagnosis not present

## 2021-04-30 DIAGNOSIS — S71102A Unspecified open wound, left thigh, initial encounter: Secondary | ICD-10-CM | POA: Diagnosis not present

## 2021-04-30 DIAGNOSIS — I495 Sick sinus syndrome: Secondary | ICD-10-CM

## 2021-04-30 LAB — CUP PACEART INCLINIC DEVICE CHECK
Battery Remaining Longevity: 26 mo
Battery Voltage: 2.94 V
Brady Statistic AP VP Percent: 1.84 %
Brady Statistic AP VS Percent: 20 %
Brady Statistic AS VP Percent: 0.5 %
Brady Statistic AS VS Percent: 77.66 %
Brady Statistic RA Percent Paced: 21.16 %
Brady Statistic RV Percent Paced: 2.21 %
Date Time Interrogation Session: 20220808094355
Implantable Lead Implant Date: 20140618
Implantable Lead Implant Date: 20140618
Implantable Lead Location: 753859
Implantable Lead Location: 753860
Implantable Pulse Generator Implant Date: 20140618
Lead Channel Impedance Value: 1045 Ohm
Lead Channel Impedance Value: 380 Ohm
Lead Channel Impedance Value: 437 Ohm
Lead Channel Impedance Value: 969 Ohm
Lead Channel Pacing Threshold Amplitude: 1.125 V
Lead Channel Pacing Threshold Amplitude: 1.25 V
Lead Channel Pacing Threshold Pulse Width: 0.4 ms
Lead Channel Pacing Threshold Pulse Width: 0.4 ms
Lead Channel Sensing Intrinsic Amplitude: 1.25 mV
Lead Channel Sensing Intrinsic Amplitude: 1.625 mV
Lead Channel Sensing Intrinsic Amplitude: 14.5 mV
Lead Channel Sensing Intrinsic Amplitude: 8.875 mV
Lead Channel Setting Pacing Amplitude: 2.5 V
Lead Channel Setting Pacing Amplitude: 2.5 V
Lead Channel Setting Pacing Pulse Width: 0.4 ms
Lead Channel Setting Sensing Sensitivity: 0.9 mV

## 2021-04-30 NOTE — Patient Instructions (Signed)
Medication Instructions:  Your physician recommends that you continue on your current medications as directed. Please refer to the Current Medication list given to you today.  *If you need a refill on your cardiac medications before your next appointment, please call your pharmacy*   Lab Work: None If you have labs (blood work) drawn today and your tests are completely normal, you will receive your results only by: [place] (if you have MyChart) OR A paper copy in the mail If you have any lab test that is abnormal or we need to change your treatment, we will call you to review the results.  Follow-Up: At [facility], you and your health needs are our priority.  As part of our continuing mission to provide you with exceptional heart care, we have created designated Provider Care Teams.  These Care Teams include your primary Cardiologist (physician) and Advanced Practice Providers (APPs -  Physician Assistants and Nurse Practitioners) who all work together to provide you with the care you need, when you need it.   Your next appointment:   07/24/2021  The format for your next appointment:   In Person  Provider:   [name], MD

## 2021-05-01 ENCOUNTER — Telehealth: Payer: Self-pay | Admitting: Neurology

## 2021-05-01 DIAGNOSIS — H5340 Unspecified visual field defects: Secondary | ICD-10-CM | POA: Diagnosis not present

## 2021-05-01 NOTE — Telephone Encounter (Signed)
Kayla Harrison [place]) called, pt was seen by Charmaine Downs, NP last week. Want to if she needs a repeat CT Scan following intracranial bleed. Would like a call from the nurse

## 2021-05-02 DIAGNOSIS — I69198 Other sequelae of nontraumatic intracerebral hemorrhage: Secondary | ICD-10-CM | POA: Diagnosis not present

## 2021-05-02 DIAGNOSIS — T8133XA Disruption of traumatic injury wound repair, initial encounter: Secondary | ICD-10-CM | POA: Diagnosis not present

## 2021-05-02 DIAGNOSIS — I4819 Other persistent atrial fibrillation: Secondary | ICD-10-CM | POA: Diagnosis not present

## 2021-05-02 DIAGNOSIS — I951 Orthostatic hypotension: Secondary | ICD-10-CM | POA: Diagnosis not present

## 2021-05-02 DIAGNOSIS — S71102A Unspecified open wound, left thigh, initial encounter: Secondary | ICD-10-CM | POA: Diagnosis not present

## 2021-05-02 DIAGNOSIS — M6281 Muscle weakness (generalized): Secondary | ICD-10-CM | POA: Diagnosis not present

## 2021-05-02 NOTE — Telephone Encounter (Signed)
CT from 6-23 was already a follow up image post ICH , showing no new bleed and good absorption.  No need to repeat unless new symptoms have been noted. CD

## 2021-05-03 DIAGNOSIS — M545 Low back pain, unspecified: Secondary | ICD-10-CM | POA: Diagnosis not present

## 2021-05-03 NOTE — Telephone Encounter (Signed)
I called Kayla Harrison at [place] that  sent message to [facility], Dr. Brett Fairy as Dr. Leonie Man is out of office  and relayed That CT from 6-23 -22 was already a follow up image post ICH , showing no new bleed and good absorption. No need to repeat unless new symptoms have been noted.  She is to call back if questions.

## 2021-05-04 DIAGNOSIS — M6281 Muscle weakness (generalized): Secondary | ICD-10-CM | POA: Diagnosis not present

## 2021-05-04 DIAGNOSIS — I4819 Other persistent atrial fibrillation: Secondary | ICD-10-CM | POA: Diagnosis not present

## 2021-05-04 DIAGNOSIS — I951 Orthostatic hypotension: Secondary | ICD-10-CM | POA: Diagnosis not present

## 2021-05-04 DIAGNOSIS — I69198 Other sequelae of nontraumatic intracerebral hemorrhage: Secondary | ICD-10-CM | POA: Diagnosis not present

## 2021-05-04 DIAGNOSIS — S71102A Unspecified open wound, left thigh, initial encounter: Secondary | ICD-10-CM | POA: Diagnosis not present

## 2021-05-04 DIAGNOSIS — T8133XA Disruption of traumatic injury wound repair, initial encounter: Secondary | ICD-10-CM | POA: Diagnosis not present

## 2021-05-07 DIAGNOSIS — T8133XA Disruption of traumatic injury wound repair, initial encounter: Secondary | ICD-10-CM | POA: Diagnosis not present

## 2021-05-07 DIAGNOSIS — I69198 Other sequelae of nontraumatic intracerebral hemorrhage: Secondary | ICD-10-CM | POA: Diagnosis not present

## 2021-05-07 DIAGNOSIS — I951 Orthostatic hypotension: Secondary | ICD-10-CM | POA: Diagnosis not present

## 2021-05-07 DIAGNOSIS — I4819 Other persistent atrial fibrillation: Secondary | ICD-10-CM | POA: Diagnosis not present

## 2021-05-07 DIAGNOSIS — M6281 Muscle weakness (generalized): Secondary | ICD-10-CM | POA: Diagnosis not present

## 2021-05-07 DIAGNOSIS — S71102A Unspecified open wound, left thigh, initial encounter: Secondary | ICD-10-CM | POA: Diagnosis not present

## 2021-05-09 DIAGNOSIS — T8133XA Disruption of traumatic injury wound repair, initial encounter: Secondary | ICD-10-CM | POA: Diagnosis not present

## 2021-05-09 DIAGNOSIS — I4819 Other persistent atrial fibrillation: Secondary | ICD-10-CM | POA: Diagnosis not present

## 2021-05-09 DIAGNOSIS — S71102A Unspecified open wound, left thigh, initial encounter: Secondary | ICD-10-CM | POA: Diagnosis not present

## 2021-05-09 DIAGNOSIS — M6281 Muscle weakness (generalized): Secondary | ICD-10-CM | POA: Diagnosis not present

## 2021-05-09 DIAGNOSIS — I951 Orthostatic hypotension: Secondary | ICD-10-CM | POA: Diagnosis not present

## 2021-05-09 DIAGNOSIS — I69198 Other sequelae of nontraumatic intracerebral hemorrhage: Secondary | ICD-10-CM | POA: Diagnosis not present

## 2021-05-10 DIAGNOSIS — T8133XA Disruption of traumatic injury wound repair, initial encounter: Secondary | ICD-10-CM | POA: Diagnosis not present

## 2021-05-10 DIAGNOSIS — S71102A Unspecified open wound, left thigh, initial encounter: Secondary | ICD-10-CM | POA: Diagnosis not present

## 2021-05-10 DIAGNOSIS — M6281 Muscle weakness (generalized): Secondary | ICD-10-CM | POA: Diagnosis not present

## 2021-05-10 DIAGNOSIS — I951 Orthostatic hypotension: Secondary | ICD-10-CM | POA: Diagnosis not present

## 2021-05-10 DIAGNOSIS — I69198 Other sequelae of nontraumatic intracerebral hemorrhage: Secondary | ICD-10-CM | POA: Diagnosis not present

## 2021-05-10 DIAGNOSIS — I4819 Other persistent atrial fibrillation: Secondary | ICD-10-CM | POA: Diagnosis not present

## 2021-05-11 DIAGNOSIS — T8133XA Disruption of traumatic injury wound repair, initial encounter: Secondary | ICD-10-CM | POA: Diagnosis not present

## 2021-05-11 DIAGNOSIS — I4819 Other persistent atrial fibrillation: Secondary | ICD-10-CM | POA: Diagnosis not present

## 2021-05-11 DIAGNOSIS — I69198 Other sequelae of nontraumatic intracerebral hemorrhage: Secondary | ICD-10-CM | POA: Diagnosis not present

## 2021-05-11 DIAGNOSIS — M6281 Muscle weakness (generalized): Secondary | ICD-10-CM | POA: Diagnosis not present

## 2021-05-11 DIAGNOSIS — I951 Orthostatic hypotension: Secondary | ICD-10-CM | POA: Diagnosis not present

## 2021-05-11 DIAGNOSIS — S71102A Unspecified open wound, left thigh, initial encounter: Secondary | ICD-10-CM | POA: Diagnosis not present

## 2021-05-14 DIAGNOSIS — M6281 Muscle weakness (generalized): Secondary | ICD-10-CM | POA: Diagnosis not present

## 2021-05-14 DIAGNOSIS — I4819 Other persistent atrial fibrillation: Secondary | ICD-10-CM | POA: Diagnosis not present

## 2021-05-14 DIAGNOSIS — T8133XA Disruption of traumatic injury wound repair, initial encounter: Secondary | ICD-10-CM | POA: Diagnosis not present

## 2021-05-14 DIAGNOSIS — S71102A Unspecified open wound, left thigh, initial encounter: Secondary | ICD-10-CM | POA: Diagnosis not present

## 2021-05-14 DIAGNOSIS — I951 Orthostatic hypotension: Secondary | ICD-10-CM | POA: Diagnosis not present

## 2021-05-14 DIAGNOSIS — I69198 Other sequelae of nontraumatic intracerebral hemorrhage: Secondary | ICD-10-CM | POA: Diagnosis not present

## 2021-05-16 DIAGNOSIS — I69198 Other sequelae of nontraumatic intracerebral hemorrhage: Secondary | ICD-10-CM | POA: Diagnosis not present

## 2021-05-16 DIAGNOSIS — I951 Orthostatic hypotension: Secondary | ICD-10-CM | POA: Diagnosis not present

## 2021-05-16 DIAGNOSIS — T8133XA Disruption of traumatic injury wound repair, initial encounter: Secondary | ICD-10-CM | POA: Diagnosis not present

## 2021-05-16 DIAGNOSIS — S71102A Unspecified open wound, left thigh, initial encounter: Secondary | ICD-10-CM | POA: Diagnosis not present

## 2021-05-16 DIAGNOSIS — I4819 Other persistent atrial fibrillation: Secondary | ICD-10-CM | POA: Diagnosis not present

## 2021-05-16 DIAGNOSIS — M6281 Muscle weakness (generalized): Secondary | ICD-10-CM | POA: Diagnosis not present

## 2021-05-18 DIAGNOSIS — M6281 Muscle weakness (generalized): Secondary | ICD-10-CM | POA: Diagnosis not present

## 2021-05-18 DIAGNOSIS — I69198 Other sequelae of nontraumatic intracerebral hemorrhage: Secondary | ICD-10-CM | POA: Diagnosis not present

## 2021-05-18 DIAGNOSIS — S71102A Unspecified open wound, left thigh, initial encounter: Secondary | ICD-10-CM | POA: Diagnosis not present

## 2021-05-18 DIAGNOSIS — T8133XA Disruption of traumatic injury wound repair, initial encounter: Secondary | ICD-10-CM | POA: Diagnosis not present

## 2021-05-18 DIAGNOSIS — I951 Orthostatic hypotension: Secondary | ICD-10-CM | POA: Diagnosis not present

## 2021-05-18 DIAGNOSIS — I4819 Other persistent atrial fibrillation: Secondary | ICD-10-CM | POA: Diagnosis not present

## 2021-05-21 DIAGNOSIS — S71102A Unspecified open wound, left thigh, initial encounter: Secondary | ICD-10-CM | POA: Diagnosis not present

## 2021-05-21 DIAGNOSIS — I951 Orthostatic hypotension: Secondary | ICD-10-CM | POA: Diagnosis not present

## 2021-05-21 DIAGNOSIS — I69198 Other sequelae of nontraumatic intracerebral hemorrhage: Secondary | ICD-10-CM | POA: Diagnosis not present

## 2021-05-21 DIAGNOSIS — I4819 Other persistent atrial fibrillation: Secondary | ICD-10-CM | POA: Diagnosis not present

## 2021-05-21 DIAGNOSIS — M6281 Muscle weakness (generalized): Secondary | ICD-10-CM | POA: Diagnosis not present

## 2021-05-21 DIAGNOSIS — T8133XA Disruption of traumatic injury wound repair, initial encounter: Secondary | ICD-10-CM | POA: Diagnosis not present

## 2021-05-23 DIAGNOSIS — I4819 Other persistent atrial fibrillation: Secondary | ICD-10-CM | POA: Diagnosis not present

## 2021-05-23 DIAGNOSIS — I69198 Other sequelae of nontraumatic intracerebral hemorrhage: Secondary | ICD-10-CM | POA: Diagnosis not present

## 2021-05-23 DIAGNOSIS — M6281 Muscle weakness (generalized): Secondary | ICD-10-CM | POA: Diagnosis not present

## 2021-05-23 DIAGNOSIS — I951 Orthostatic hypotension: Secondary | ICD-10-CM | POA: Diagnosis not present

## 2021-05-23 DIAGNOSIS — S71102A Unspecified open wound, left thigh, initial encounter: Secondary | ICD-10-CM | POA: Diagnosis not present

## 2021-05-23 DIAGNOSIS — T8133XA Disruption of traumatic injury wound repair, initial encounter: Secondary | ICD-10-CM | POA: Diagnosis not present

## 2021-05-25 DIAGNOSIS — I69198 Other sequelae of nontraumatic intracerebral hemorrhage: Secondary | ICD-10-CM | POA: Diagnosis not present

## 2021-05-25 DIAGNOSIS — T8133XA Disruption of traumatic injury wound repair, initial encounter: Secondary | ICD-10-CM | POA: Diagnosis not present

## 2021-05-25 DIAGNOSIS — I110 Hypertensive heart disease with heart failure: Secondary | ICD-10-CM | POA: Diagnosis not present

## 2021-05-25 DIAGNOSIS — I5032 Chronic diastolic (congestive) heart failure: Secondary | ICD-10-CM | POA: Diagnosis not present

## 2021-05-25 DIAGNOSIS — Z853 Personal history of malignant neoplasm of breast: Secondary | ICD-10-CM | POA: Diagnosis not present

## 2021-05-25 DIAGNOSIS — Z602 Problems related to living alone: Secondary | ICD-10-CM | POA: Diagnosis not present

## 2021-05-25 DIAGNOSIS — I4819 Other persistent atrial fibrillation: Secondary | ICD-10-CM | POA: Diagnosis not present

## 2021-05-25 DIAGNOSIS — Z8616 Personal history of COVID-19: Secondary | ICD-10-CM | POA: Diagnosis not present

## 2021-05-25 DIAGNOSIS — Z96611 Presence of right artificial shoulder joint: Secondary | ICD-10-CM | POA: Diagnosis not present

## 2021-05-25 DIAGNOSIS — Z85850 Personal history of malignant neoplasm of thyroid: Secondary | ICD-10-CM | POA: Diagnosis not present

## 2021-05-25 DIAGNOSIS — S71102A Unspecified open wound, left thigh, initial encounter: Secondary | ICD-10-CM | POA: Diagnosis not present

## 2021-05-25 DIAGNOSIS — Z8572 Personal history of non-Hodgkin lymphomas: Secondary | ICD-10-CM | POA: Diagnosis not present

## 2021-05-25 DIAGNOSIS — M6281 Muscle weakness (generalized): Secondary | ICD-10-CM | POA: Diagnosis not present

## 2021-05-25 DIAGNOSIS — Z7901 Long term (current) use of anticoagulants: Secondary | ICD-10-CM | POA: Diagnosis not present

## 2021-05-25 DIAGNOSIS — I4892 Unspecified atrial flutter: Secondary | ICD-10-CM | POA: Diagnosis not present

## 2021-05-25 DIAGNOSIS — Z9181 History of falling: Secondary | ICD-10-CM | POA: Diagnosis not present

## 2021-05-25 DIAGNOSIS — Z8673 Personal history of transient ischemic attack (TIA), and cerebral infarction without residual deficits: Secondary | ICD-10-CM | POA: Diagnosis not present

## 2021-05-25 DIAGNOSIS — I951 Orthostatic hypotension: Secondary | ICD-10-CM | POA: Diagnosis not present

## 2021-05-25 DIAGNOSIS — Z6833 Body mass index (BMI) 33.0-33.9, adult: Secondary | ICD-10-CM | POA: Diagnosis not present

## 2021-05-25 DIAGNOSIS — Z85118 Personal history of other malignant neoplasm of bronchus and lung: Secondary | ICD-10-CM | POA: Diagnosis not present

## 2021-05-25 DIAGNOSIS — E669 Obesity, unspecified: Secondary | ICD-10-CM | POA: Diagnosis not present

## 2021-05-28 DIAGNOSIS — T8133XA Disruption of traumatic injury wound repair, initial encounter: Secondary | ICD-10-CM | POA: Diagnosis not present

## 2021-05-28 DIAGNOSIS — I69198 Other sequelae of nontraumatic intracerebral hemorrhage: Secondary | ICD-10-CM | POA: Diagnosis not present

## 2021-05-28 DIAGNOSIS — I4819 Other persistent atrial fibrillation: Secondary | ICD-10-CM | POA: Diagnosis not present

## 2021-05-28 DIAGNOSIS — M6281 Muscle weakness (generalized): Secondary | ICD-10-CM | POA: Diagnosis not present

## 2021-05-28 DIAGNOSIS — S71102A Unspecified open wound, left thigh, initial encounter: Secondary | ICD-10-CM | POA: Diagnosis not present

## 2021-05-28 DIAGNOSIS — I951 Orthostatic hypotension: Secondary | ICD-10-CM | POA: Diagnosis not present

## 2021-05-29 DIAGNOSIS — I69198 Other sequelae of nontraumatic intracerebral hemorrhage: Secondary | ICD-10-CM | POA: Diagnosis not present

## 2021-05-29 DIAGNOSIS — I4819 Other persistent atrial fibrillation: Secondary | ICD-10-CM | POA: Diagnosis not present

## 2021-05-29 DIAGNOSIS — I951 Orthostatic hypotension: Secondary | ICD-10-CM | POA: Diagnosis not present

## 2021-05-29 DIAGNOSIS — S71102A Unspecified open wound, left thigh, initial encounter: Secondary | ICD-10-CM | POA: Diagnosis not present

## 2021-05-29 DIAGNOSIS — T8133XA Disruption of traumatic injury wound repair, initial encounter: Secondary | ICD-10-CM | POA: Diagnosis not present

## 2021-05-29 DIAGNOSIS — M6281 Muscle weakness (generalized): Secondary | ICD-10-CM | POA: Diagnosis not present

## 2021-05-30 DIAGNOSIS — S71102A Unspecified open wound, left thigh, initial encounter: Secondary | ICD-10-CM | POA: Diagnosis not present

## 2021-05-30 DIAGNOSIS — T8133XA Disruption of traumatic injury wound repair, initial encounter: Secondary | ICD-10-CM | POA: Diagnosis not present

## 2021-05-30 DIAGNOSIS — M6281 Muscle weakness (generalized): Secondary | ICD-10-CM | POA: Diagnosis not present

## 2021-05-30 DIAGNOSIS — I4819 Other persistent atrial fibrillation: Secondary | ICD-10-CM | POA: Diagnosis not present

## 2021-05-30 DIAGNOSIS — I951 Orthostatic hypotension: Secondary | ICD-10-CM | POA: Diagnosis not present

## 2021-05-30 DIAGNOSIS — I69198 Other sequelae of nontraumatic intracerebral hemorrhage: Secondary | ICD-10-CM | POA: Diagnosis not present

## 2021-05-31 DIAGNOSIS — S71102A Unspecified open wound, left thigh, initial encounter: Secondary | ICD-10-CM | POA: Diagnosis not present

## 2021-05-31 DIAGNOSIS — I951 Orthostatic hypotension: Secondary | ICD-10-CM | POA: Diagnosis not present

## 2021-05-31 DIAGNOSIS — T8133XA Disruption of traumatic injury wound repair, initial encounter: Secondary | ICD-10-CM | POA: Diagnosis not present

## 2021-05-31 DIAGNOSIS — M6281 Muscle weakness (generalized): Secondary | ICD-10-CM | POA: Diagnosis not present

## 2021-05-31 DIAGNOSIS — I69198 Other sequelae of nontraumatic intracerebral hemorrhage: Secondary | ICD-10-CM | POA: Diagnosis not present

## 2021-05-31 DIAGNOSIS — I4819 Other persistent atrial fibrillation: Secondary | ICD-10-CM | POA: Diagnosis not present

## 2021-06-01 ENCOUNTER — Telehealth: Payer: Self-pay | Admitting: Internal Medicine

## 2021-06-01 DIAGNOSIS — M6281 Muscle weakness (generalized): Secondary | ICD-10-CM | POA: Diagnosis not present

## 2021-06-01 DIAGNOSIS — T8133XA Disruption of traumatic injury wound repair, initial encounter: Secondary | ICD-10-CM | POA: Diagnosis not present

## 2021-06-01 DIAGNOSIS — I951 Orthostatic hypotension: Secondary | ICD-10-CM | POA: Diagnosis not present

## 2021-06-01 DIAGNOSIS — I69198 Other sequelae of nontraumatic intracerebral hemorrhage: Secondary | ICD-10-CM | POA: Diagnosis not present

## 2021-06-01 DIAGNOSIS — I4819 Other persistent atrial fibrillation: Secondary | ICD-10-CM | POA: Diagnosis not present

## 2021-06-01 DIAGNOSIS — S71102A Unspecified open wound, left thigh, initial encounter: Secondary | ICD-10-CM | POA: Diagnosis not present

## 2021-06-01 NOTE — Telephone Encounter (Signed)
Spoke with Anissa from [facility].  Advised pt's Eliquis was stopped upon discharge from hospital June 2022 and ASA 81mg  was restarted by another provider outside of cardiology July 2022.  She reports pt is not taking ASA and thanked Therapist, sports for the callback.

## 2021-06-01 NOTE — Telephone Encounter (Signed)
Pt c/o medication issue:  1. Name of Medication: aspirin EC 81 MG EC tablet Eliquis  2. How are you currently taking this medication (dosage and times per day)? Not currently taking  3. Are you having a reaction (difficulty breathing--STAT)? No  4. What is your medication issue? Hiram Comber with [facility] was advised by the pt that she is no longer supposed to be taking the 2 meds named above, she wants to clafify this information because it's still on her meds list

## 2021-06-04 ENCOUNTER — Ambulatory Visit (INDEPENDENT_AMBULATORY_CARE_PROVIDER_SITE_OTHER): Payer: Medicare Other

## 2021-06-04 DIAGNOSIS — S71102A Unspecified open wound, left thigh, initial encounter: Secondary | ICD-10-CM | POA: Diagnosis not present

## 2021-06-04 DIAGNOSIS — I951 Orthostatic hypotension: Secondary | ICD-10-CM | POA: Diagnosis not present

## 2021-06-04 DIAGNOSIS — I495 Sick sinus syndrome: Secondary | ICD-10-CM | POA: Diagnosis not present

## 2021-06-04 DIAGNOSIS — I4819 Other persistent atrial fibrillation: Secondary | ICD-10-CM | POA: Diagnosis not present

## 2021-06-04 DIAGNOSIS — M6281 Muscle weakness (generalized): Secondary | ICD-10-CM | POA: Diagnosis not present

## 2021-06-04 DIAGNOSIS — T8133XA Disruption of traumatic injury wound repair, initial encounter: Secondary | ICD-10-CM | POA: Diagnosis not present

## 2021-06-04 DIAGNOSIS — I69198 Other sequelae of nontraumatic intracerebral hemorrhage: Secondary | ICD-10-CM | POA: Diagnosis not present

## 2021-06-05 ENCOUNTER — Inpatient Hospital Stay: Payer: Medicare Other | Admitting: Physical Medicine and Rehabilitation

## 2021-06-06 DIAGNOSIS — I4819 Other persistent atrial fibrillation: Secondary | ICD-10-CM | POA: Diagnosis not present

## 2021-06-06 DIAGNOSIS — S71102A Unspecified open wound, left thigh, initial encounter: Secondary | ICD-10-CM | POA: Diagnosis not present

## 2021-06-06 DIAGNOSIS — I69198 Other sequelae of nontraumatic intracerebral hemorrhage: Secondary | ICD-10-CM | POA: Diagnosis not present

## 2021-06-06 DIAGNOSIS — T8133XA Disruption of traumatic injury wound repair, initial encounter: Secondary | ICD-10-CM | POA: Diagnosis not present

## 2021-06-06 DIAGNOSIS — I951 Orthostatic hypotension: Secondary | ICD-10-CM | POA: Diagnosis not present

## 2021-06-06 DIAGNOSIS — M6281 Muscle weakness (generalized): Secondary | ICD-10-CM | POA: Diagnosis not present

## 2021-06-06 LAB — CUP PACEART REMOTE DEVICE CHECK
Battery Remaining Longevity: 24 mo
Battery Voltage: 2.93 V
Brady Statistic RA Percent Paced: 7.66 %
Brady Statistic RV Percent Paced: 6.85 %
Date Time Interrogation Session: 20220913180152
Implantable Lead Implant Date: 20140618
Implantable Lead Implant Date: 20140618
Implantable Lead Location: 753859
Implantable Lead Location: 753860
Implantable Pulse Generator Implant Date: 20140618
Lead Channel Impedance Value: 1007 Ohm
Lead Channel Impedance Value: 342 Ohm
Lead Channel Impedance Value: 418 Ohm
Lead Channel Impedance Value: 950 Ohm
Lead Channel Pacing Threshold Amplitude: 1.125 V
Lead Channel Pacing Threshold Amplitude: 1.125 V
Lead Channel Pacing Threshold Pulse Width: 0.4 ms
Lead Channel Pacing Threshold Pulse Width: 0.4 ms
Lead Channel Sensing Intrinsic Amplitude: 1.375 mV
Lead Channel Sensing Intrinsic Amplitude: 1.375 mV
Lead Channel Sensing Intrinsic Amplitude: 8.875 mV
Lead Channel Sensing Intrinsic Amplitude: 8.875 mV
Lead Channel Setting Pacing Amplitude: 2.5 V
Lead Channel Setting Pacing Amplitude: 2.5 V
Lead Channel Setting Pacing Pulse Width: 0.4 ms
Lead Channel Setting Sensing Sensitivity: 0.9 mV

## 2021-06-07 DIAGNOSIS — I951 Orthostatic hypotension: Secondary | ICD-10-CM | POA: Diagnosis not present

## 2021-06-07 DIAGNOSIS — M6281 Muscle weakness (generalized): Secondary | ICD-10-CM | POA: Diagnosis not present

## 2021-06-07 DIAGNOSIS — I69198 Other sequelae of nontraumatic intracerebral hemorrhage: Secondary | ICD-10-CM | POA: Diagnosis not present

## 2021-06-07 DIAGNOSIS — I4819 Other persistent atrial fibrillation: Secondary | ICD-10-CM | POA: Diagnosis not present

## 2021-06-07 DIAGNOSIS — T8133XA Disruption of traumatic injury wound repair, initial encounter: Secondary | ICD-10-CM | POA: Diagnosis not present

## 2021-06-07 DIAGNOSIS — S71102A Unspecified open wound, left thigh, initial encounter: Secondary | ICD-10-CM | POA: Diagnosis not present

## 2021-06-08 DIAGNOSIS — T8133XA Disruption of traumatic injury wound repair, initial encounter: Secondary | ICD-10-CM | POA: Diagnosis not present

## 2021-06-08 DIAGNOSIS — M6281 Muscle weakness (generalized): Secondary | ICD-10-CM | POA: Diagnosis not present

## 2021-06-08 DIAGNOSIS — I69198 Other sequelae of nontraumatic intracerebral hemorrhage: Secondary | ICD-10-CM | POA: Diagnosis not present

## 2021-06-08 DIAGNOSIS — S71102A Unspecified open wound, left thigh, initial encounter: Secondary | ICD-10-CM | POA: Diagnosis not present

## 2021-06-08 DIAGNOSIS — I951 Orthostatic hypotension: Secondary | ICD-10-CM | POA: Diagnosis not present

## 2021-06-08 DIAGNOSIS — I4819 Other persistent atrial fibrillation: Secondary | ICD-10-CM | POA: Diagnosis not present

## 2021-06-11 DIAGNOSIS — S71102A Unspecified open wound, left thigh, initial encounter: Secondary | ICD-10-CM | POA: Diagnosis not present

## 2021-06-11 DIAGNOSIS — I951 Orthostatic hypotension: Secondary | ICD-10-CM | POA: Diagnosis not present

## 2021-06-11 DIAGNOSIS — M6281 Muscle weakness (generalized): Secondary | ICD-10-CM | POA: Diagnosis not present

## 2021-06-11 DIAGNOSIS — I69198 Other sequelae of nontraumatic intracerebral hemorrhage: Secondary | ICD-10-CM | POA: Diagnosis not present

## 2021-06-11 DIAGNOSIS — I4819 Other persistent atrial fibrillation: Secondary | ICD-10-CM | POA: Diagnosis not present

## 2021-06-11 DIAGNOSIS — T8133XA Disruption of traumatic injury wound repair, initial encounter: Secondary | ICD-10-CM | POA: Diagnosis not present

## 2021-06-12 DIAGNOSIS — M6281 Muscle weakness (generalized): Secondary | ICD-10-CM | POA: Diagnosis not present

## 2021-06-12 DIAGNOSIS — I69198 Other sequelae of nontraumatic intracerebral hemorrhage: Secondary | ICD-10-CM | POA: Diagnosis not present

## 2021-06-12 DIAGNOSIS — C50911 Malignant neoplasm of unspecified site of right female breast: Secondary | ICD-10-CM | POA: Diagnosis not present

## 2021-06-12 DIAGNOSIS — E890 Postprocedural hypothyroidism: Secondary | ICD-10-CM | POA: Diagnosis not present

## 2021-06-12 DIAGNOSIS — I2510 Atherosclerotic heart disease of native coronary artery without angina pectoris: Secondary | ICD-10-CM | POA: Diagnosis not present

## 2021-06-12 DIAGNOSIS — J432 Centrilobular emphysema: Secondary | ICD-10-CM | POA: Diagnosis not present

## 2021-06-12 DIAGNOSIS — S71102A Unspecified open wound, left thigh, initial encounter: Secondary | ICD-10-CM | POA: Diagnosis not present

## 2021-06-12 DIAGNOSIS — I951 Orthostatic hypotension: Secondary | ICD-10-CM | POA: Diagnosis not present

## 2021-06-12 DIAGNOSIS — M8589 Other specified disorders of bone density and structure, multiple sites: Secondary | ICD-10-CM | POA: Diagnosis not present

## 2021-06-12 DIAGNOSIS — E785 Hyperlipidemia, unspecified: Secondary | ICD-10-CM | POA: Diagnosis not present

## 2021-06-12 DIAGNOSIS — D509 Iron deficiency anemia, unspecified: Secondary | ICD-10-CM | POA: Diagnosis not present

## 2021-06-12 DIAGNOSIS — N1831 Chronic kidney disease, stage 3a: Secondary | ICD-10-CM | POA: Diagnosis not present

## 2021-06-12 DIAGNOSIS — T8133XA Disruption of traumatic injury wound repair, initial encounter: Secondary | ICD-10-CM | POA: Diagnosis not present

## 2021-06-12 DIAGNOSIS — I4819 Other persistent atrial fibrillation: Secondary | ICD-10-CM | POA: Diagnosis not present

## 2021-06-12 DIAGNOSIS — I480 Paroxysmal atrial fibrillation: Secondary | ICD-10-CM | POA: Diagnosis not present

## 2021-06-12 DIAGNOSIS — I700 Atherosclerosis of aorta: Secondary | ICD-10-CM | POA: Diagnosis not present

## 2021-06-12 DIAGNOSIS — S71102S Unspecified open wound, left thigh, sequela: Secondary | ICD-10-CM | POA: Diagnosis not present

## 2021-06-12 DIAGNOSIS — Z23 Encounter for immunization: Secondary | ICD-10-CM | POA: Diagnosis not present

## 2021-06-12 DIAGNOSIS — E440 Moderate protein-calorie malnutrition: Secondary | ICD-10-CM | POA: Diagnosis not present

## 2021-06-12 NOTE — Progress Notes (Signed)
Remote pacemaker transmission.   

## 2021-06-13 DIAGNOSIS — I4819 Other persistent atrial fibrillation: Secondary | ICD-10-CM | POA: Diagnosis not present

## 2021-06-13 DIAGNOSIS — H5340 Unspecified visual field defects: Secondary | ICD-10-CM | POA: Diagnosis not present

## 2021-06-13 DIAGNOSIS — I951 Orthostatic hypotension: Secondary | ICD-10-CM | POA: Diagnosis not present

## 2021-06-13 DIAGNOSIS — T8133XA Disruption of traumatic injury wound repair, initial encounter: Secondary | ICD-10-CM | POA: Diagnosis not present

## 2021-06-13 DIAGNOSIS — I69198 Other sequelae of nontraumatic intracerebral hemorrhage: Secondary | ICD-10-CM | POA: Diagnosis not present

## 2021-06-13 DIAGNOSIS — M6281 Muscle weakness (generalized): Secondary | ICD-10-CM | POA: Diagnosis not present

## 2021-06-13 DIAGNOSIS — S71102A Unspecified open wound, left thigh, initial encounter: Secondary | ICD-10-CM | POA: Diagnosis not present

## 2021-06-21 DIAGNOSIS — I4819 Other persistent atrial fibrillation: Secondary | ICD-10-CM | POA: Diagnosis not present

## 2021-06-21 DIAGNOSIS — T8133XA Disruption of traumatic injury wound repair, initial encounter: Secondary | ICD-10-CM | POA: Diagnosis not present

## 2021-06-21 DIAGNOSIS — M6281 Muscle weakness (generalized): Secondary | ICD-10-CM | POA: Diagnosis not present

## 2021-06-21 DIAGNOSIS — I951 Orthostatic hypotension: Secondary | ICD-10-CM | POA: Diagnosis not present

## 2021-06-21 DIAGNOSIS — I69198 Other sequelae of nontraumatic intracerebral hemorrhage: Secondary | ICD-10-CM | POA: Diagnosis not present

## 2021-06-21 DIAGNOSIS — S71102A Unspecified open wound, left thigh, initial encounter: Secondary | ICD-10-CM | POA: Diagnosis not present

## 2021-06-27 ENCOUNTER — Encounter: Payer: Self-pay | Admitting: Neurology

## 2021-06-27 ENCOUNTER — Other Ambulatory Visit: Payer: Self-pay

## 2021-06-27 ENCOUNTER — Ambulatory Visit (INDEPENDENT_AMBULATORY_CARE_PROVIDER_SITE_OTHER): Payer: Medicare Other | Admitting: Neurology

## 2021-06-27 VITALS — BP 138/87 | HR 98 | Ht 66.0 in | Wt 188.0 lb

## 2021-06-27 DIAGNOSIS — I610 Nontraumatic intracerebral hemorrhage in hemisphere, subcortical: Secondary | ICD-10-CM | POA: Diagnosis not present

## 2021-06-27 NOTE — Progress Notes (Signed)
[facility] [address] [place]. [place] [address] [phone]       OFFICE FOLLOW-UP NOTE  [name] Date of Birth:  1946/05/04 Medical Record Number:  [id number]   HPI: Kayla Harrison is a pleasant 75 year old Caucasian lady seen today for initial office visit following hospital admission for intracerebral hemorrhage in June 2022.  History is obtained from the patient and review of electronic medical records and I personally reviewed pertinent available imaging films in PACS.Kayla Harrison is a 75 y.o. female with a history of hyperlipidemia, atrial fibrillation on Eliquis, lymphoma, who presents with confusion.  She was initially found down on 03/15/2021, and told the physicians that she slipped off the bed and could not get back up.  She was down for several hours.  She apparently had some problems remembering these events while she was over in the emergency department, but recovered quickly..In the emergency department, she was taken for head CT which demonstrates a subcortical hemorrhage on the left with intraventricular extension.  Her blood pressure was not elevated and was 122/72 and remained normal throughout hospital stay.  She had only mild right sided drift and weakness on arrival.  CT head showed a small left caudothalamic groove intracerebral hemorrhage with extending into the left lateral ventricle with mild dilatation of the left lateral ventricle with 5 mm left-to-right midline shift.  There was moderate age-related atrophy.  Neurosurgery was consulted but patient did not need ventriculostomy.  LDL cholesterol was 85 mg percent and hemoglobin A1c was 5.5.  She had a 2D echo on 03/09/2021 with ejection fraction of 55 to 60% and hence was not repeated.  MRI could not be obtained as she had a pacemaker.  CT angiogram of the head and neck was obtained which showed no large vessel stenosis, aneurysm or AVM.  She did well and was transferred to inpatient  rehab where she had a short stay and subsequently has been discharged home.  She is living at home by herself.  She still feels her walking and her strength and balance are not good.  She has been using a wheeled walker but still feels little unsteady.  She has had a few minor falls but fortunately no major injuries.  She states her blood pressure has been under good control and has never been high and she has not been on blood pressure medicines.  She started taking a baby aspirin now and Eliquis was discontinued in the hospital stay.  Patient seems interested in considering participation in the ASPIRE study (aspirin versus Eliquis for patients with history of intracerebral hemorrhage and atrial fibrillation)  ROS:   14 system review of systems is positive for imbalance, dizziness, weakness, unsteadiness, difficulty walking all other systems negative  PMH:  Past Medical History:  Diagnosis Date   Anemia    Arthritis    osteoarthritis - knees and right shoulder   Blood transfusion without reported diagnosis    Breast cancer ([place])    Dr Margot Chimes, total thyroidectomy- 1999- for cancer   Brucellosis 1964   Chronic bilateral pleural effusions    Colon polyp    Dr Earlean Shawl   Complication of anesthesia    Ketamine produces LSD reaction, bright colored nightmarish experience    Dyslipidemia    Endometriosis    Fibroids    H/O pleural effusion    s/p thoracentesis w 3264ml withdrawn   Hepatitis    [place] ?hepatitis  History of dysphagia    due to radiation therapy   History of hiatal hernia    small noted on PET scan   Hypertension    Hypothyroidism    Lung cancer, lower lobe ([place]) 01/2017   radiation RX completed 03/04/17; will start chemo 6/27, pt unaware of lung cancer   Morbid obesity ([place])    Status post lap band surgery   Nephrolithiasis    Non Hodgkin's lymphoma ([place])    on chemotherapy   Persistent atrial fibrillation ([place])    a. s/p PVI  2008 b. s/p convergent ablation 2563 complicated by bradycardia requiring pacemaker implant   Personal history of radiation therapy    Presence of permanent cardiac pacemaker    Rotator cuff tear    Right   Stroke ([place])    2003- [place] x2   SVC syndrome    with lung mass and non hodgkins lymphoma   Thyroid cancer ([place]) 2000    Social History:  Social History   Socioeconomic History   Marital status: Single    Spouse name: Not on file   Number of children: 0   Years of education: Not on file   Highest education level: Master's degree (e.g., MA, MS, MEng, MEd, MSW, MBA)  Occupational History   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    Employer: RETIRED  Tobacco Use   Smoking status: Never   Smokeless tobacco: Never  Vaping Use   Vaping Use: Never used  Substance and Sexual Activity   Alcohol use: No    Comment: none since 1990   Drug use: Never   Sexual activity: Not Currently    Birth control/protection: Surgical  Other Topics Concern   Not on file  Social History Narrative   ** Merged History Encounter **       SINGLE  NO CHILDREN NEVER SMOKED EXERCISE 3 X WK RETIRED NO CAFFEINE      Social Determinants of Health   Financial Resource Strain: Not on file  Food Insecurity: Not on file  Transportation Needs: Not on file  Physical Activity: Not on file  Stress: Not on file  Social Connections: Not on file  Intimate Partner Violence: Not on file    Medications:   Current Outpatient Medications on File Prior to Visit  Medication Sig Dispense Refill   acetaminophen (TYLENOL) 325 MG tablet Take 2 tablets (650 mg total) by mouth every 4 (four) hours as needed for mild pain (or temp > 37.5 C (99.5 F)).     amiodarone (PACERONE) 200 MG tablet Take 1 tablet (200 mg total) by mouth in the morning and at bedtime.     aspirin EC 81 MG EC tablet Take 1 tablet (81 mg total) by mouth daily. Swallow whole. 30 tablet 11   levothyroxine (SYNTHROID) 137  MCG tablet Take 68.5-137 mcg by mouth See admin instructions. Taking 137 mcg tablet daily except on Saturday taking 1/2 tablet     levothyroxine (SYNTHROID) 150 MCG tablet Take 150 mcg by mouth every morning.     metoprolol tartrate (LOPRESSOR) 25 MG tablet as needed. Take 1/2 tab for HR >100     midodrine (PROAMATINE) 5 MG tablet Take 1 tablet (5 mg total) by mouth 2 (two) times daily with breakfast and lunch.     polyethylene glycol (MIRALAX / GLYCOLAX) 17 g packet Take 17 g by mouth daily. 14 each 0   rosuvastatin (CRESTOR) 10 MG tablet Take 1 tablet (10 mg total) by mouth daily.  Mondays & Thursdays     senna-docusate (SENOKOT-S) 8.6-50 MG tablet Take 2 tablets by mouth 2 (two) times daily. (Patient taking differently: Take 2 tablets by mouth as needed for mild constipation or moderate constipation.)     No current facility-administered medications on file prior to visit.    Allergies:   Allergies  Allergen Reactions   Ranolazine     Balance issues   Rivaroxaban     Nose Bleed X 6 hrs ; packing in ER Nasal hemorrhage   Tape Rash and Other (See Comments)    SKIN IS THIN AND TEARS EASILY (also does not tolerate paper tape well)     Tikosyn [Dofetilide] Other (See Comments)    "Long QT wave"    Epinephrine Other (See Comments)    Oral anesthetic Dental form only (liquid). Patient stated she will become "out of it" she can hear you but cannot respond in normal fashion. "not fully with it"   Ketamine Other (See Comments)    Hallucinations     Physical Exam General: Frail elderly Caucasian lady, seated, in no evident distress Head: head normocephalic and atraumatic.  Neck: supple with no carotid or supraclavicular bruits Cardiovascular: regular rate and rhythm, no murmurs Musculoskeletal: no deformity Skin:  no rash/petichiae Vascular:  Normal pulses all extremities Vitals:   06/27/21 1100  BP: 138/87  Pulse: 98   Neurologic Exam Mental Status: Awake and fully alert.  Oriented to place and time. Recent and remote memory slightly diminished attention span, concentration and fund of knowledge slightly diminished.. Mood and affect appropriate.  Cranial Nerves: Fundoscopic exam reveals sharp disc margins. Pupils equal, briskly reactive to light. Extraocular movements full without nystagmus. Visual fields full to confrontation. Hearing mildly diminished bilaterally. Facial sensation intact. Face, tongue, palate moves normally and symmetrically.  Motor: Normal bulk and tone. Normal strength in all tested extremity muscles. Sensory.: intact to touch ,pinprick .position and vibratory sensation.  Coordination: Rapid alternating movements normal in all extremities. Finger-to-nose and heel-to-shin performed accurately bilaterally. Gait and Station: Arises from chair without difficulty. Stance is normal. Gait demonstrates normal stride length but mild imbalance while turning.  Uses a walker..  Not able to heel, toe and tandem walk without difficulty.  Reflexes: 1+ and symmetric. Toes downgoing.   NIHSS  0 Modified Rankin  2   ASSESSMENT: 76 year old Caucasian lady with small left basal ganglia intracerebral hemorrhage in June 2022 likely related to coagulopathy with Eliquis for her atrial fibrillation.  Patient is doing quite well with practically no residual deficits.     PLAN: I had a long discussion with the patient regarding her recent intracerebral hemorrhage which is likely related to being on Eliquis for atrial fibrillation.  Recommend outpatient physical and occupational therapy to improve her gait and balance.  Continue strict control of hypertension with blood pressure goal below 130/90.  Continue aspirin for now but consider possible participation in the ASPIRE trial of aspirin versus Eliquis in patients with A. fib who have intracerebral hemorrhage to see which treatment is safer and better.  She will be given information to review and decide.  She will return  for follow-up in 6 months or call earlier if necessary.Greater than 50% of time during this 25 minute visit was spent on counseling,explanation of diagnosis, planning of further management, discussion with patient and family and coordination of care Antony Contras, MD Note: This document was prepared with digital dictation and possible smart phrase technology. Any transcriptional errors that result from this process  are unintentional

## 2021-06-27 NOTE — Patient Instructions (Signed)
I had a long discussion with the patient regarding her recent intracerebral hemorrhage which is likely related to being on Eliquis for atrial fibrillation.  Recommend outpatient physical and occupational therapy to improve her gait and balance.  Continue strict control of hypertension with blood pressure goal below 130/90.  Continue aspirin for now but consider possible participation in the ASPIRE trial of aspirin versus Eliquis in patients with A. fib who have intracerebral hemorrhage to see which treatment is safer and better.  She will be given information to review and decide.  She will return for follow-up in 6 months or call earlier if necessary. Intracerebral Hemorrhage An intracerebral hemorrhage occurs when a blood vessel in the brain leaks or bursts (ruptures). As a result, that area of the brain becomes damaged. This is due to a lack of blood and oxygen, and from leaked blood that pools and presses on brain tissue. This is also called a bleeding (hemorrhagic) stroke. A stroke is a medical emergency. It must be treated right away. Early treatment will increase the chances of a better recovery. Delay may lead to permanent loss of brain function. What are the causes? This condition may be caused by: Head injury (trauma). A bulging of a weak section in a blood vessel (aneurysm). Thin and hardened blood vessels due to plaque buildup on the walls of an artery. Tangled blood vessels in the brain (arteriovenous malformation). A brain tumor. Protein buildup on the artery walls of the brain (amyloid angiopathy). Sometimes, the cause of this condition is not known. What increases the risk? You are more likely to develop this condition if you: Have high blood pressure (hypertension). Are an older adult. Abuse alcohol or drugs. Take blood-thinning medicines (anticoagulants). What are the signs or symptoms? Symptoms of this condition include: The sudden onset of: Weakness or numbness of the face,  arm, or leg, especially on one side of the body. Nausea and vomiting. Trouble speaking or understanding speech. Trouble seeing in one or both eyes. Difficulty walking or moving the arms or legs. Dizziness, or loss of balance or coordination. Confusion. Headache. Seizures. How is this diagnosed? This condition may be diagnosed based on: Your symptoms, medical history, and a physical exam. Tests, including: CT scan or MRI to view the brain. Computed tomography angiography (CTA) or a magnetic resonance angiogram (MRA) to view vessels in the brain. How is this treated? The goals of treatment are to stop the bleeding, control pressure in the brain, and relieve symptoms. Treatment may include: Medicines to treat symptoms, such as hypertension, pain, nausea, and vomiting. Other medicines, blood products, or vitamin K to control the bleeding. A tube (shunt) in the brain to relieve pressure. Assisted breathing (ventilation). Surgery to stop bleeding, remove a blood clot or tumor, and reduce pressure. Surgeries may include: Craniotomy. This temporarily removes part of the skull in order to reduce pressure in the brain. Stereotactic aspiration. This uses a needle or syringe to remove blood from the brain. Follow these instructions at home: Medicines Take over-the-counter and prescription medicines only as told by your health care provider. Do not take medicines, such as aspirin and ibuprofen, unless your health care provider tells you to take them. These medicines can thin your blood and increase the risk of bleeding. Eating and drinking Eat healthy foods as directed by your health care provider. You may be asked to: Eat a diet that is low in salt (sodium), saturated fat, trans fat, and cholesterol to manage your blood pressure. Seek the help  of specialists if your ability to swallow safely has been affected. A dietitian, speech-language specialists, and an occupational therapist can teach you  how to get the nutrition you need. If you drink alcohol: Limit how much you use to: 0-1 drink a day for women who are not pregnant. 0-2 drinks a day for men. Know how much alcohol is in your drink. In the [place] one drink equals one 12 oz bottle of beer (355 mL), one 5 oz glass of wine (148 mL), or one 1 oz glass of hard liquor (44 mL). Lifestyle Do not use any products that contain nicotine or tobacco. These include cigarettes, chewing tobacco, and vaping devices, such as e-cigarettes. If you need help quitting, ask your health care provider. Follow your health care provider's instructions about preventing falls. Your health care provider may: Arrange for specialists to evaluate your home. Recommend that you install grab bars in the bedroom and bathroom. Arrange for special equipment to be used at home, such as a raised toilet and a seat for the shower. Use a walker or a cane at all times if directed by your health care provider. Do physical activities as told by your health care provider. Ask your health care provider what activities are safe for you. General instructions Follow instructions to manage any other health conditions you may have, including high blood pressure, diabetes, and losing weight if needed. Keep all follow-up visits. This is important. Visits include referrals for physical and occupational therapy, rehabilitation, and lab tests. Get help right away if:  You have any symptoms of a stroke. "BE FAST" is an easy way to remember the main warning signs of a stroke: B - Balance. Signs are dizziness, sudden trouble walking, or loss of balance. E - Eyes. Signs are trouble seeing or a sudden change in vision. F - Face. Signs are sudden weakness or numbness of the face, or the face or eyelid drooping on one side. A - Arms. Signs are weakness or numbness in an arm. This happens suddenly and usually on one side of the body. S - Speech. Signs are sudden trouble speaking, slurred  speech, or trouble understanding what people say. T - Time. Time to call emergency services. Write down what time symptoms started. You have other signs of a stroke, such as: A sudden, severe headache with no known cause. Nausea or vomiting. Seizure. You have a partial or total loss of consciousness. Confusion. These symptoms may represent a serious problem that is an emergency. Do not wait to see if the symptoms will go away. Get medical help right away. Call your local emergency services (911 in the [place] Do not drive yourself to the hospital. Summary An intracerebral hemorrhage occurs when a blood vessel in the brain leaks or bursts. This is a medical emergency. Early treatment will increase the chances of a better recovery. Delay may lead to permanent damage and loss of brain function. The goals of treatment are to stop the bleeding, control pressure in the brain, and relieve symptoms. Keep all follow-up visits. This is important. Visits include referrals for physical and occupational therapy, rehabilitation, and lab tests. This information is not intended to replace advice given to you by your health care provider. Make sure you discuss any questions you have with your health care provider. Document Revised: 06/13/2020 Document Reviewed: 06/13/2020 Elsevier Patient Education  2022 [employer].

## 2021-07-04 ENCOUNTER — Ambulatory Visit: Payer: Medicare Other | Admitting: Physical Therapy

## 2021-07-04 IMAGING — DX DG CHEST 1V PORT
2 series · 2 of 2 positions shown · non-contrast
Comparison: Radiograph 04/02/2019

CLINICAL DATA: Trauma. Fall from standing.  On anticoagulation.

EXAM:
PORTABLE CHEST 1 VIEW

[chest ap (1 of 2)]
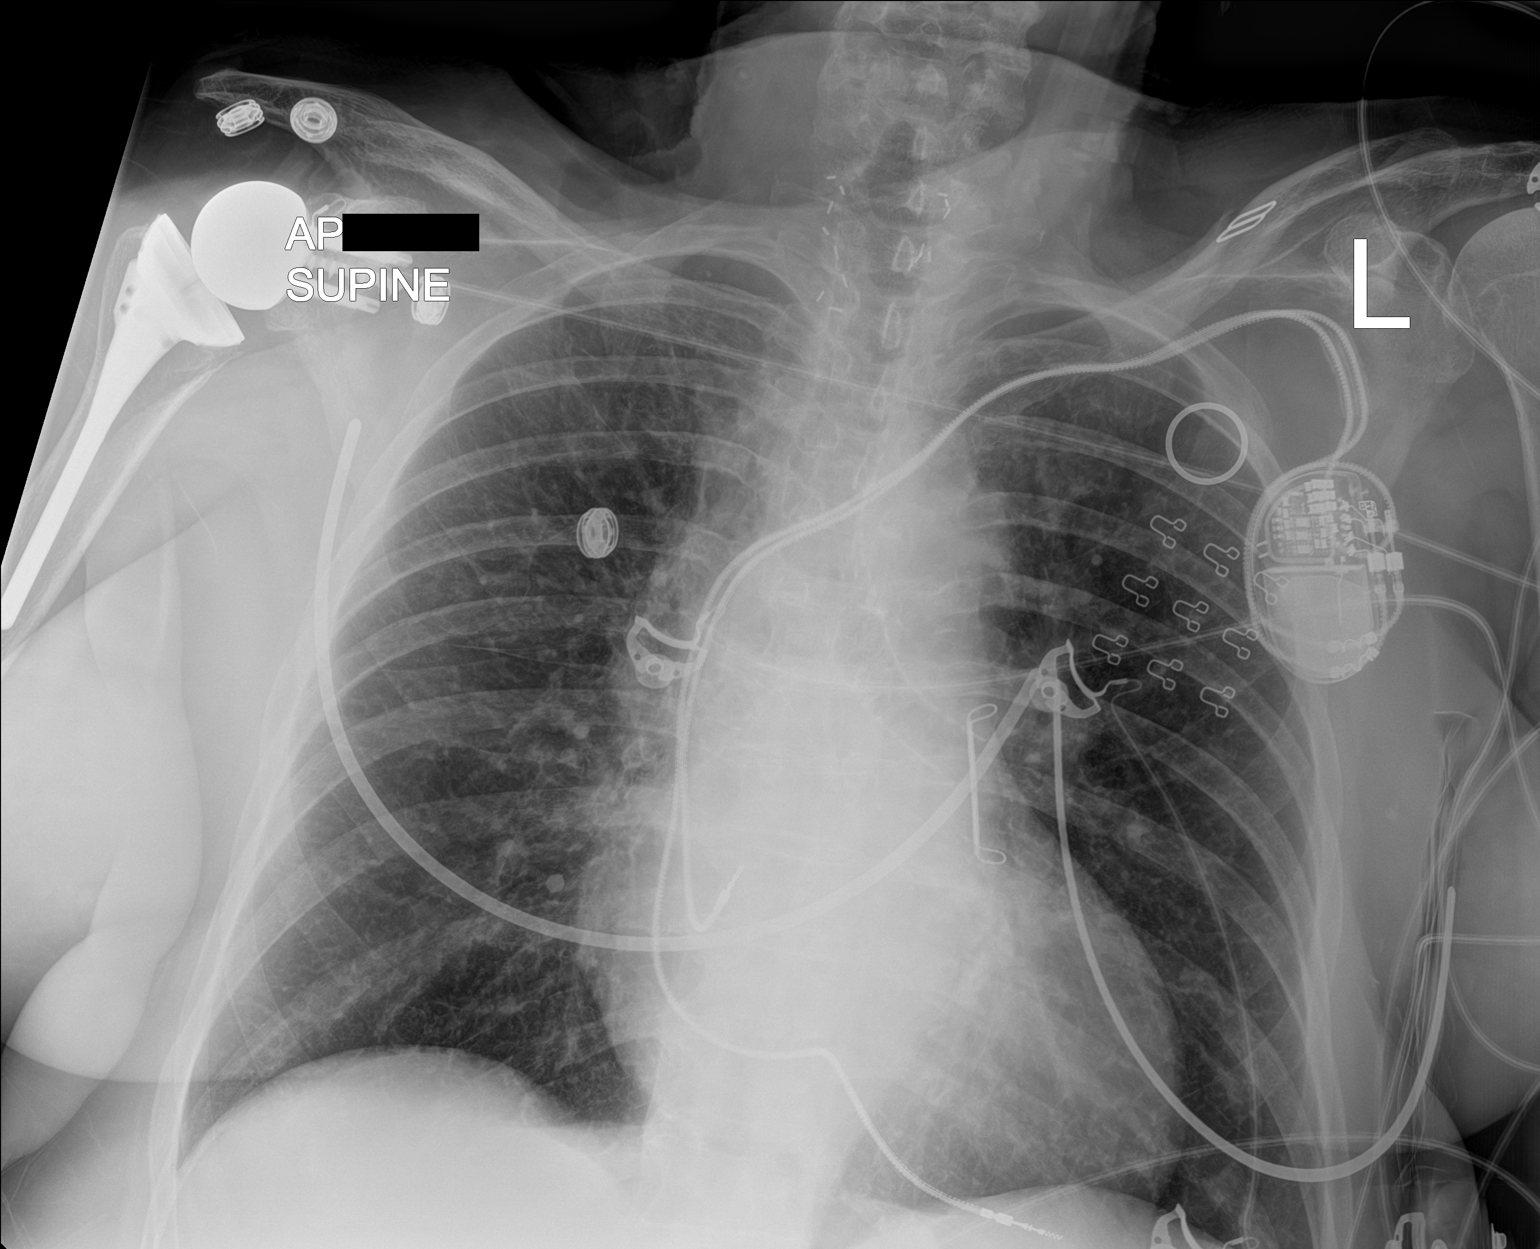

[chest ap (2 of 2)]
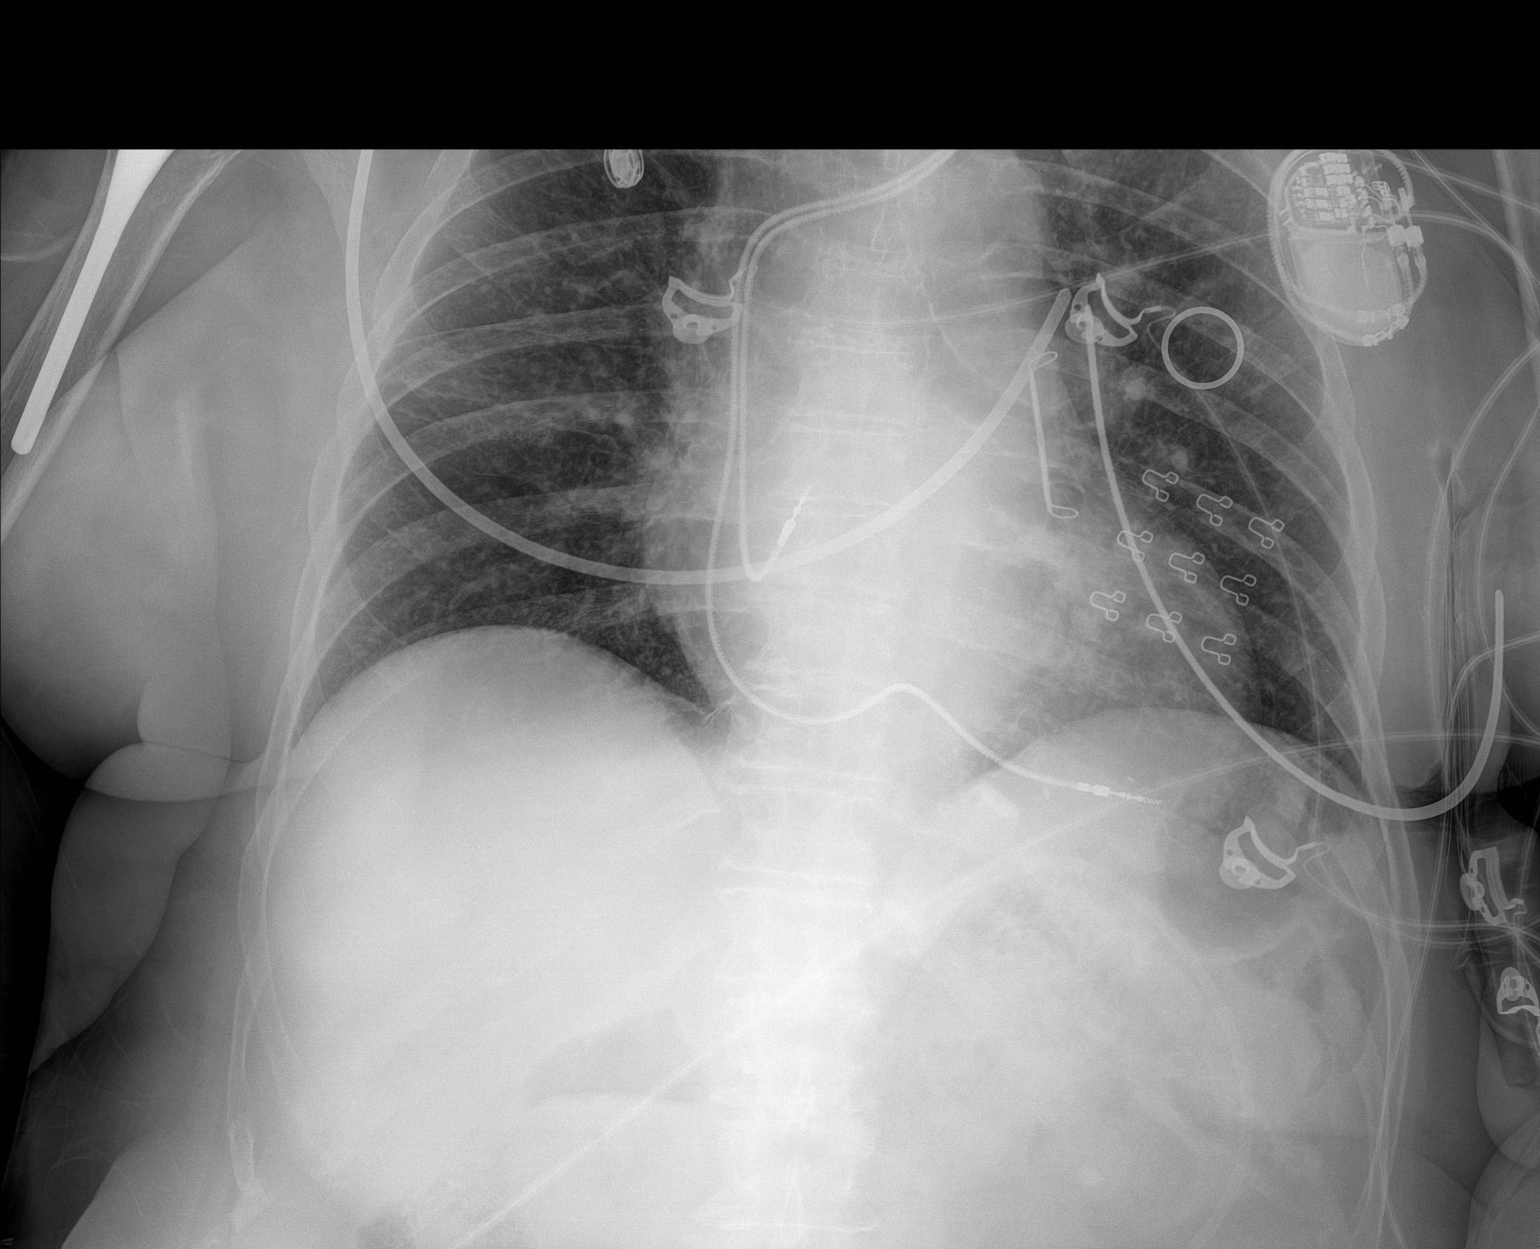

[2 of 2 positions shown; findings below may reference images not displayed]

FINDINGS: Borderline cardiomegaly. Post left atrial clipping. Mediastinal
contours are unchanged. Left-sided pacemaker with leads unchanged in
position. No focal airspace disease. No pneumothorax. No large
pleural effusion. No pulmonary edema. Reverse right shoulder
arthroplasty. No acute osseous abnormalities are seen. Surgical
clips at the thoracic inlet.
IMPRESSION: No evidence of acute traumatic injury to the thorax.

## 2021-07-04 IMAGING — CT CT L SPINE W/O CM
4 of 6 series · 13 of 33 positions shown, 15 images · non-contrast
Comparison: January 02, 2019.

CLINICAL DATA: Level 2 trauma.  Fall with back pain.

EXAM:
CT LUMBAR SPINE WITHOUT CONTRAST
TECHNIQUE: Multidetector CT imaging of the lumbar spine was performed without
intravenous contrast administration. Multiplanar CT image
reconstructions were also generated. Automatic exposure control
utilized.

[Series 5: l spine soft · axial · 0.32mm/px · z∈[+786,+830]mm · 2 of 131 slices shown]
[im 22/131  soft-tissue]
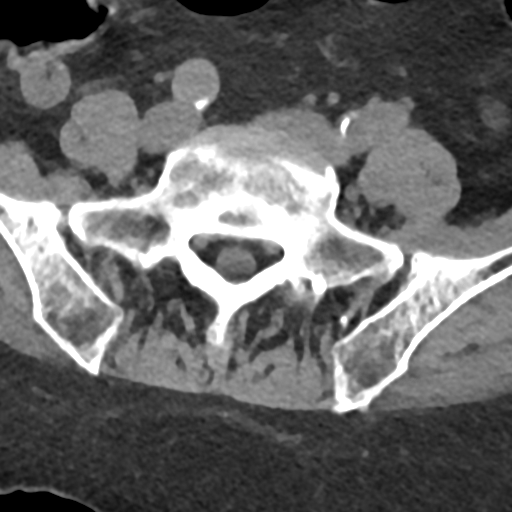
[im 44/131  soft-tissue]
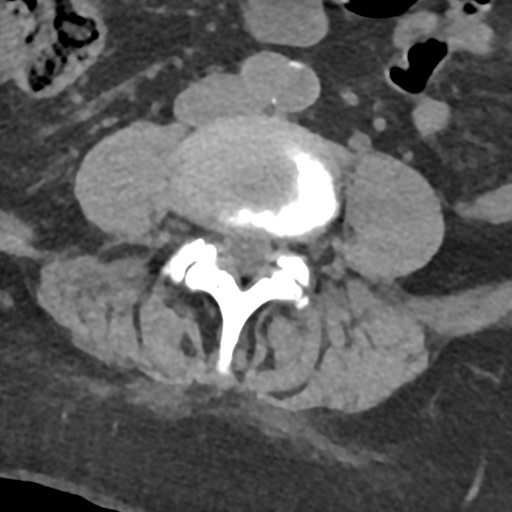

[Series 7: cor bone · coronal · 0.37mm/px · 1 of 82 slices shown]
[im 41/82  bone]
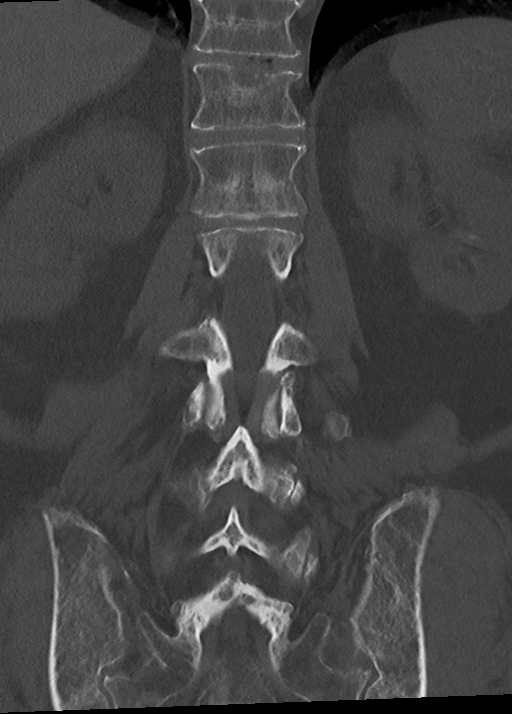

[Series 9: sag st · sagittal · 0.31mm/px · 5 of 81 slices shown]
[im 14/81  bone]
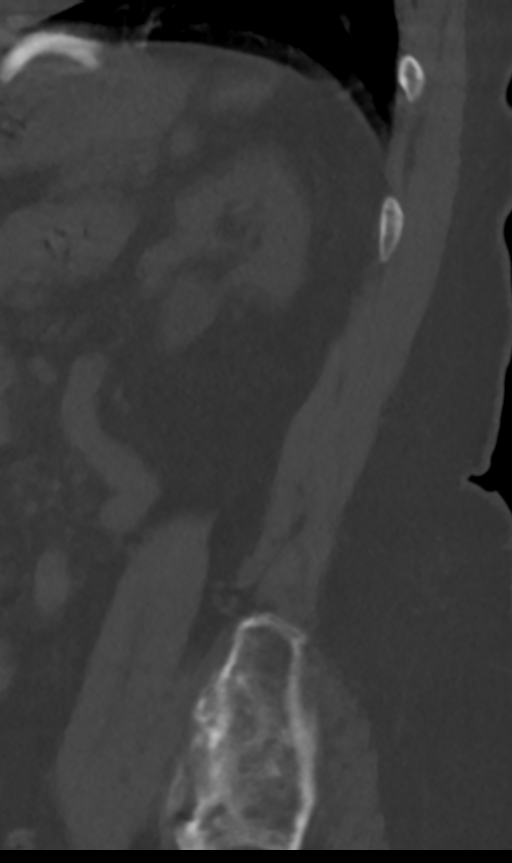
[im 27/81  bone]
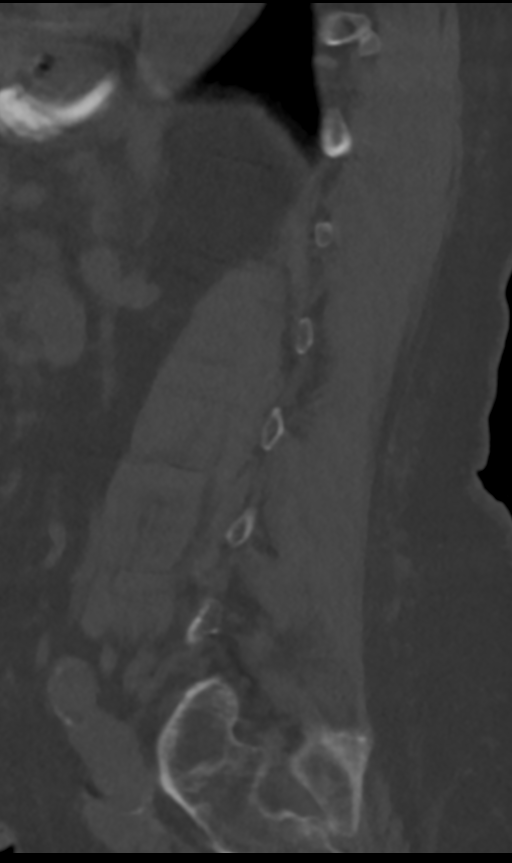
[im 41/81  bone]
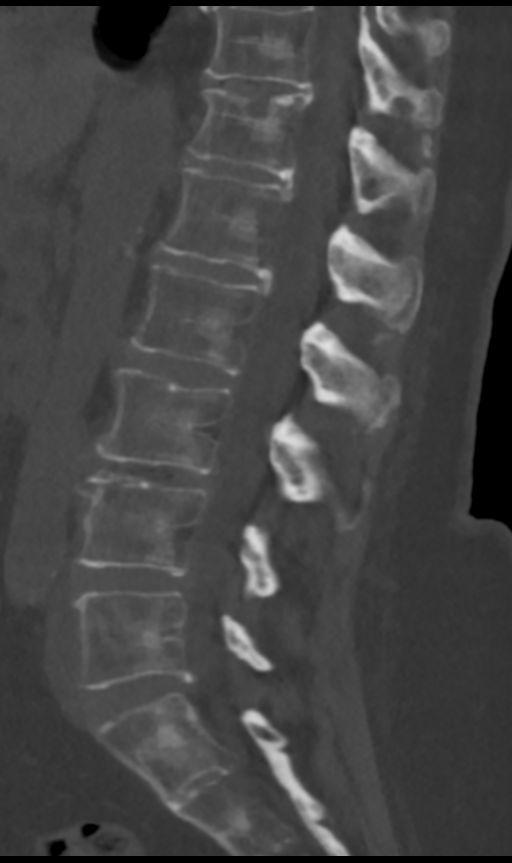
[im 54/81  bone]
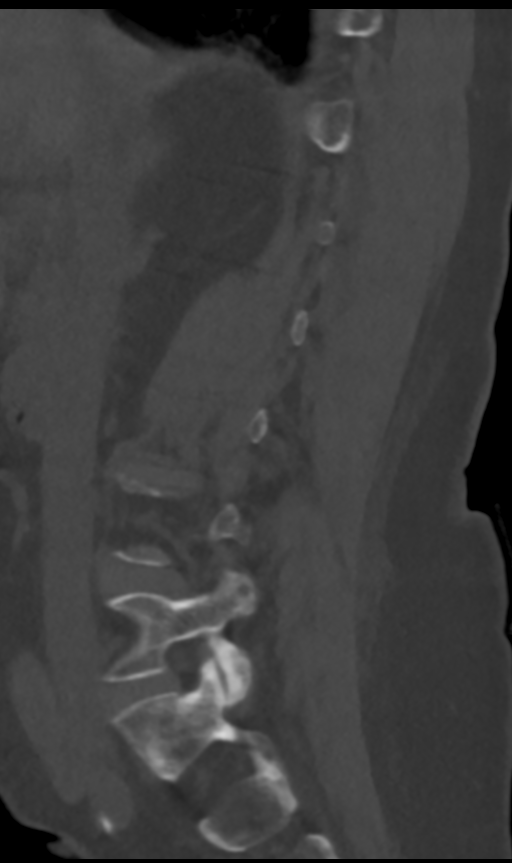
[im 67/81  bone]
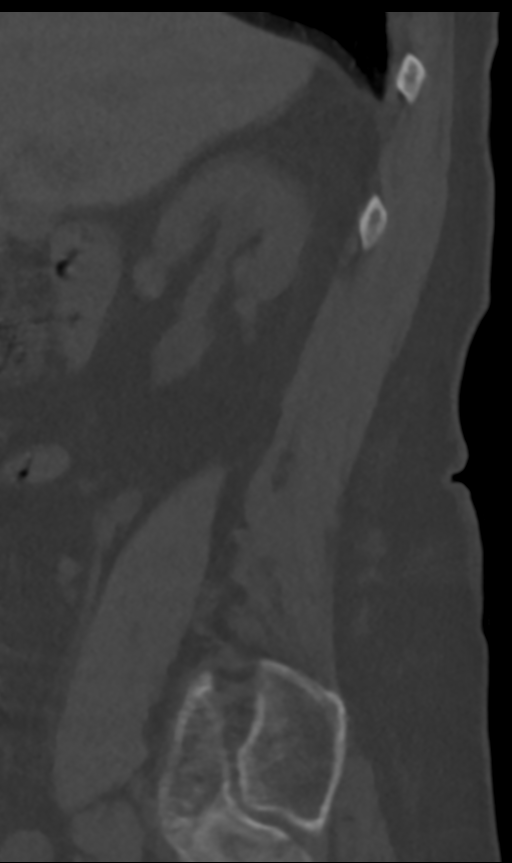

[Series 10: orthogonal · axial · 0.21mm/px · z∈[+787,+967]mm · 5 of 129 slices shown, 7 images]
[im 22/129  soft-tissue]
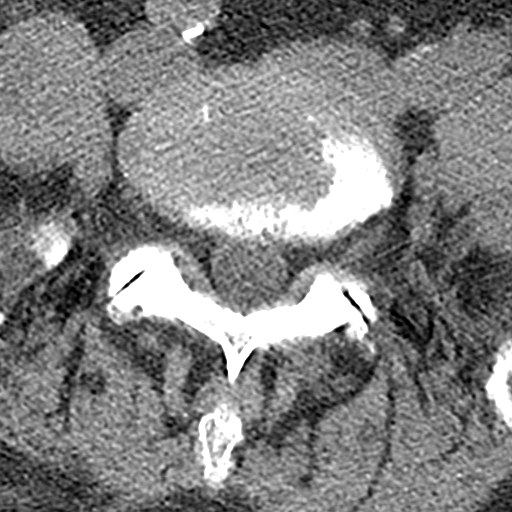
[im 22/129  bone]
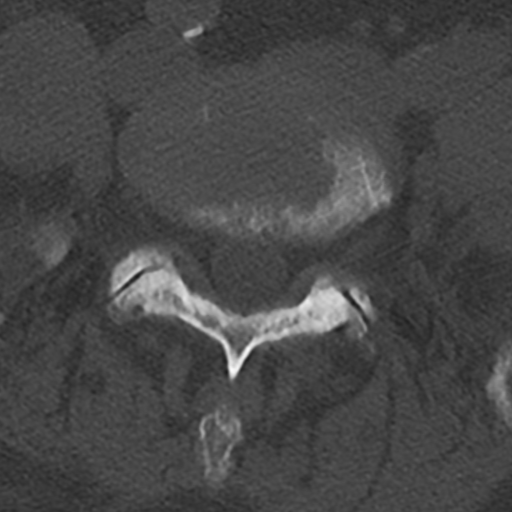
[im 43/129  bone]
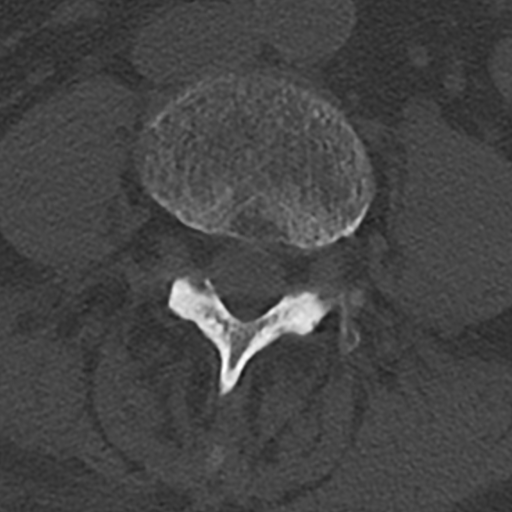
[im 65/129  bone]
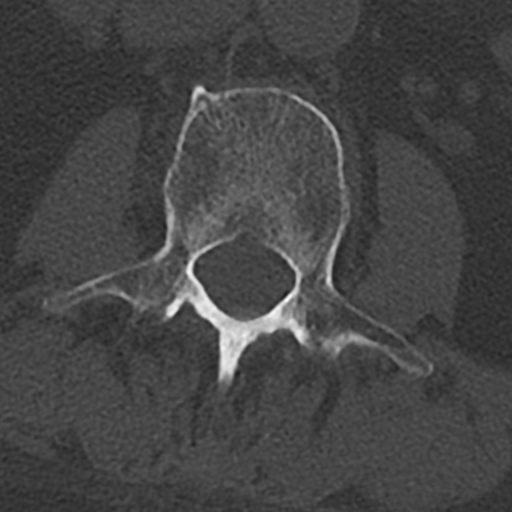
[im 86/129  bone]
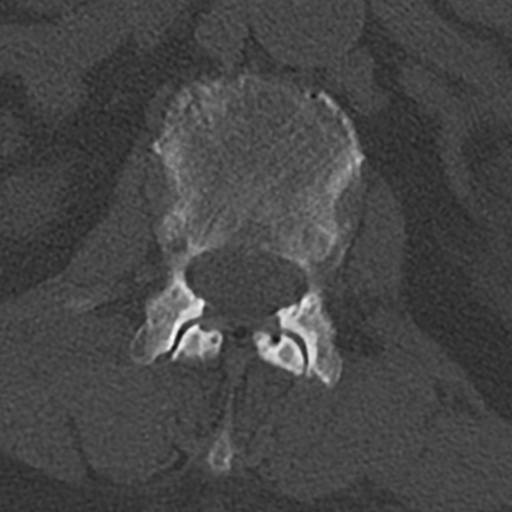
[im 107/129  soft-tissue]
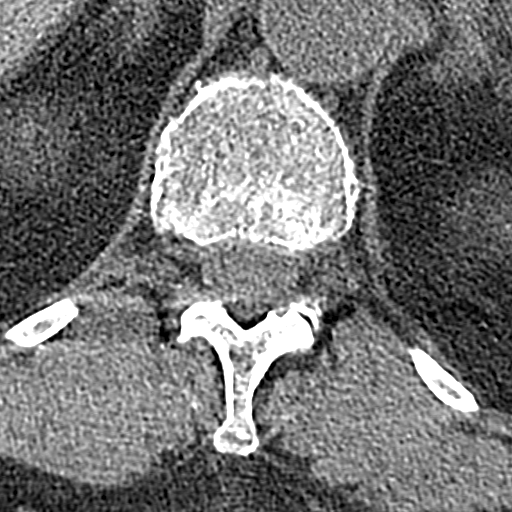
[im 107/129  bone]
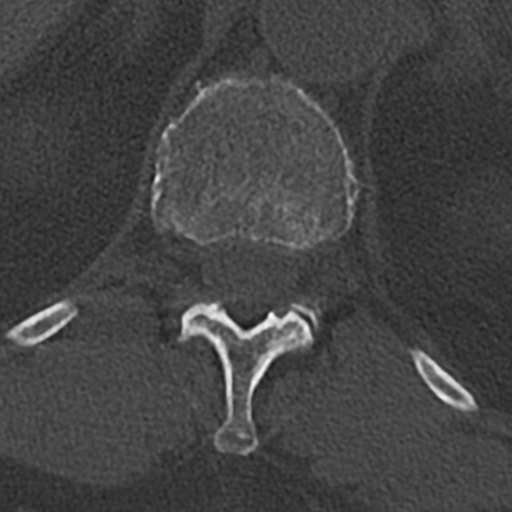

[13 of 33 positions shown; findings below may reference images not displayed]

FINDINGS: Segmentation: Five non-rib-bearing lumbar vertebral bodies.

Alignment: Grade 1 retrolisthesis of L3 on L4, unchanged and likely
degenerative. Straightening of the lumbar lordosis.

Vertebrae: Interval T12 small intravertebral disc herniation.
Similar chronic mild 10% compression deformities of the T12 and L4
superior endplates. Diffuse permeative bone pattern. A 5 mm L2 and
T11 suspicious lytic lesion. An L2 5 mm sclerotic lesion, unchanged.

Paraspinal and other soft tissues: Centrilobular emphysema. Remote
gastric banding with hiatal hernia. Mild bilateral hydronephrosis
with mild perinephric and peri ureteral inflammatory change,
possibly infectious or secondary to medical renal disease.
Nonobstructing left and right renal calcifications. Benign splenic
granulomatous calcification. No retroperitoneal hematoma. Aortic
calcified atherosclerosis without an abdominal aortic aneurysm.

Disc levels: No posttraumatic disruption. Diffuse mild and moderate
acquired disc disease and hypertrophic bone spurring.
IMPRESSION: Interval T12 superior endplate intravertebral disc herniation, which
could be acute. No acute bony injury of the lumbar spine.

Diffusely abnormal permeative bone marrow pattern concerning for
aggressive osteopenia or a benign or malignant marrow infiltrating
disease. A couple small lytic and sclerotic osseous lesions, stable
compared to December 2018.

Lumbar muscle spasm and chronic degenerative changes and acquired
disc disease.

Aortic calcified atherosclerosis and centrilobular emphysema.

Mild bilateral hydronephrosis with perinephric and peri-ureteral
inflammatory changes and nonobstructing bilateral renal calculi.
Recommend correlation with urinalysis to exclude an upper urinary
tract infection.

## 2021-07-04 IMAGING — CT CT T SPINE W/O CM
3 of 6 series · 10 of 34 positions shown, 12 images · non-contrast
Comparison: CT chest dated August 13, 2019

CLINICAL DATA: Level 2 trauma.  Fall with back pain.

EXAM:
CT THORACIC SPINE WITHOUT CONTRAST
TECHNIQUE: Multidetector CT images of the thoracic were obtained using the
standard protocol without intravenous contrast. Automatic exposure
control utilized.

[Series 7: cor bone · coronal · 0.26mm/px · 1 of 82 slices shown]
[im 41/82  bone]
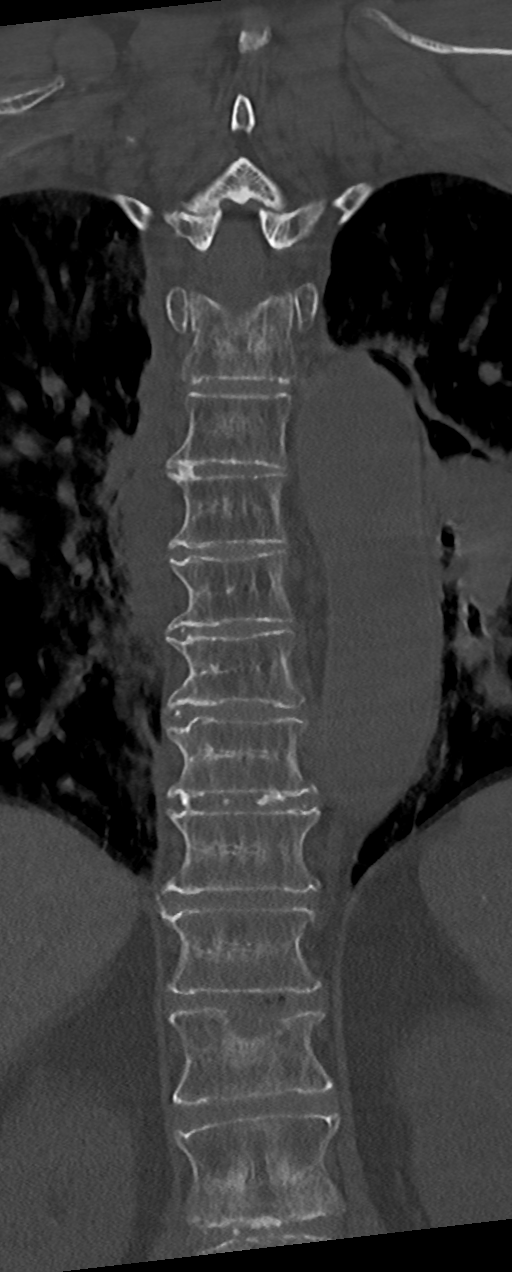

[Series 8: orthogonal bone · axial · 0.21mm/px · z∈[+977,+1174]mm · 4 of 170 slices shown, 5 images]
[im 29/170  soft-tissue]
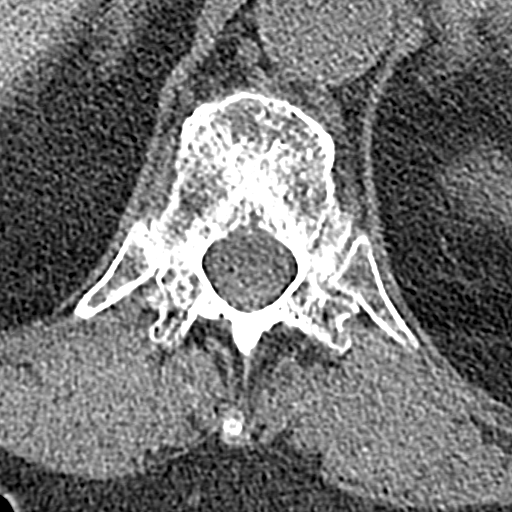
[im 29/170  bone]
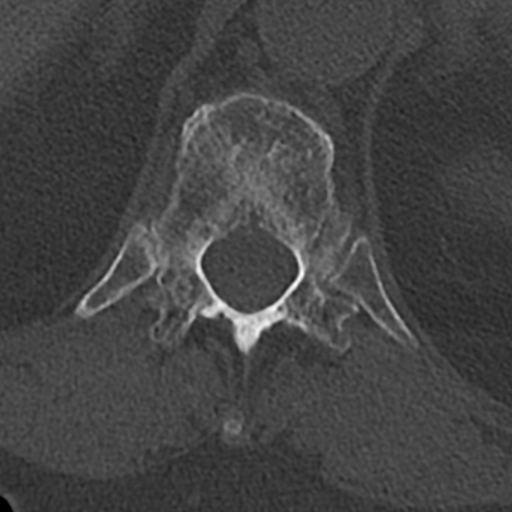
[im 57/170  bone]
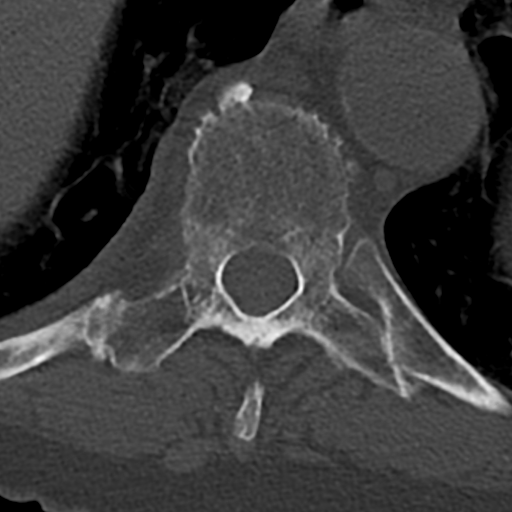
[im 113/170  bone]
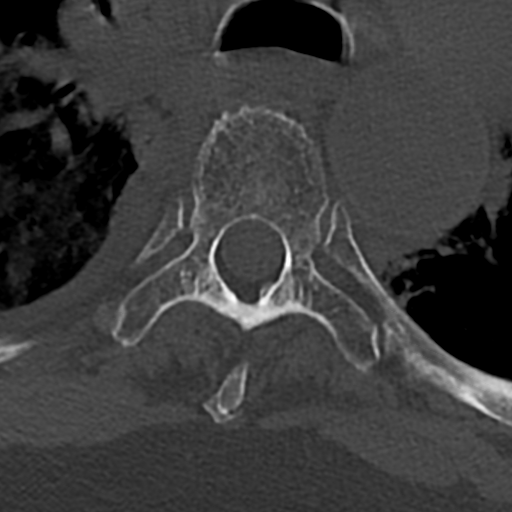
[im 141/170  bone]
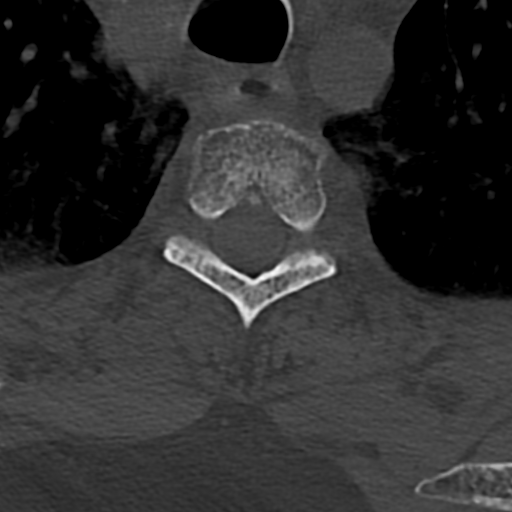

[Series 10: sag st · sagittal · 0.33mm/px · 5 of 65 slices shown, 6 images]
[im 11/65  soft-tissue]
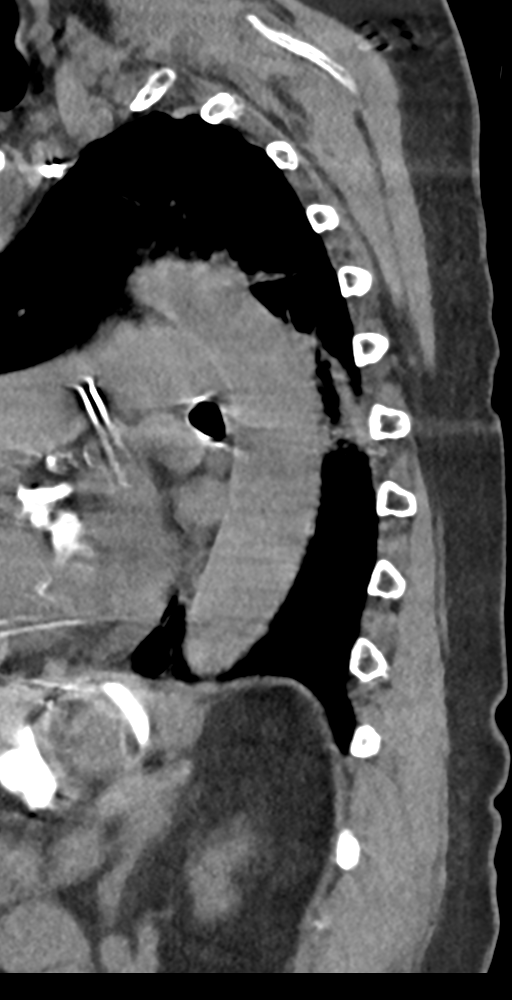
[im 11/65  bone]
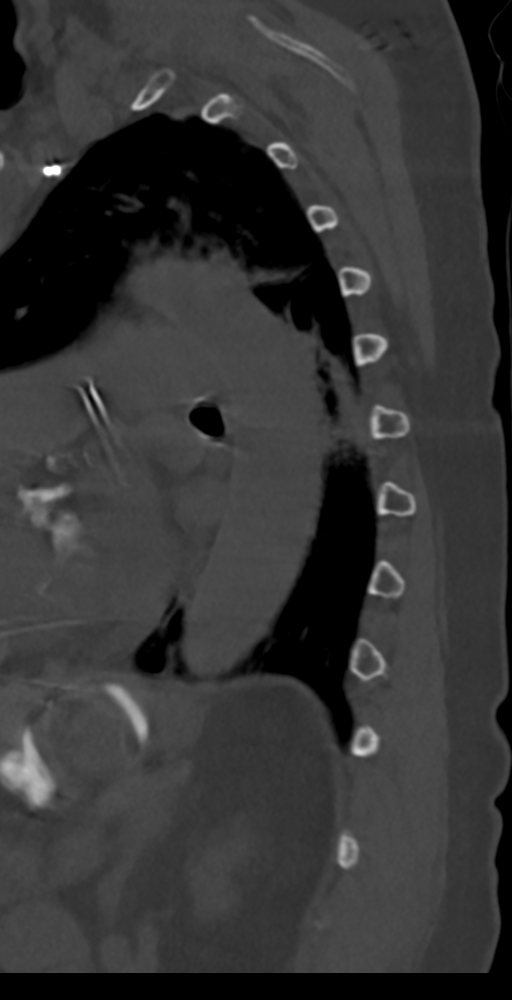
[im 22/65  bone]
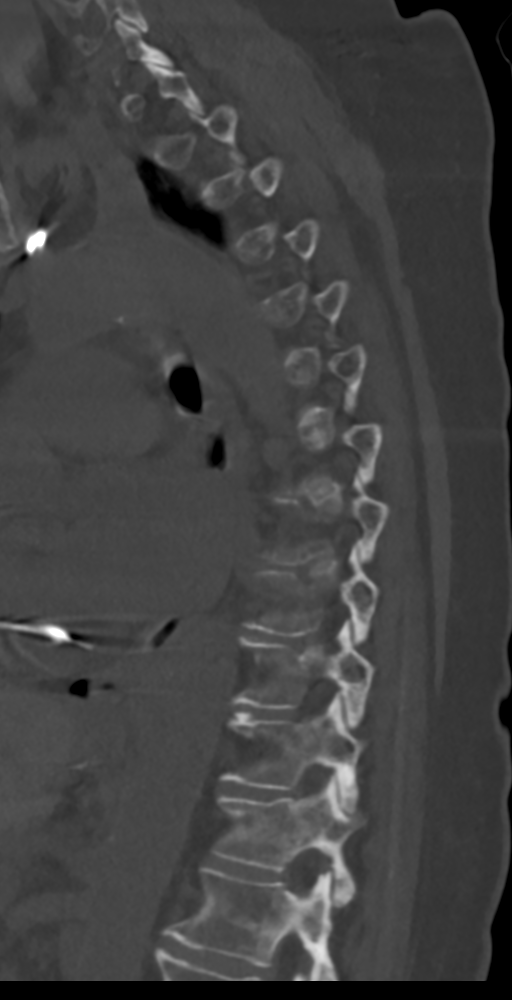
[im 33/65  bone]
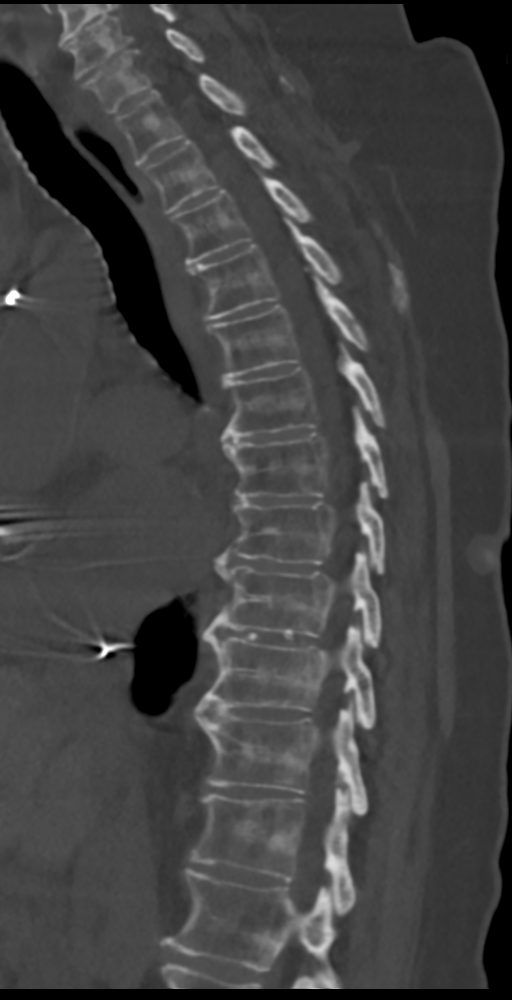
[im 43/65  bone]
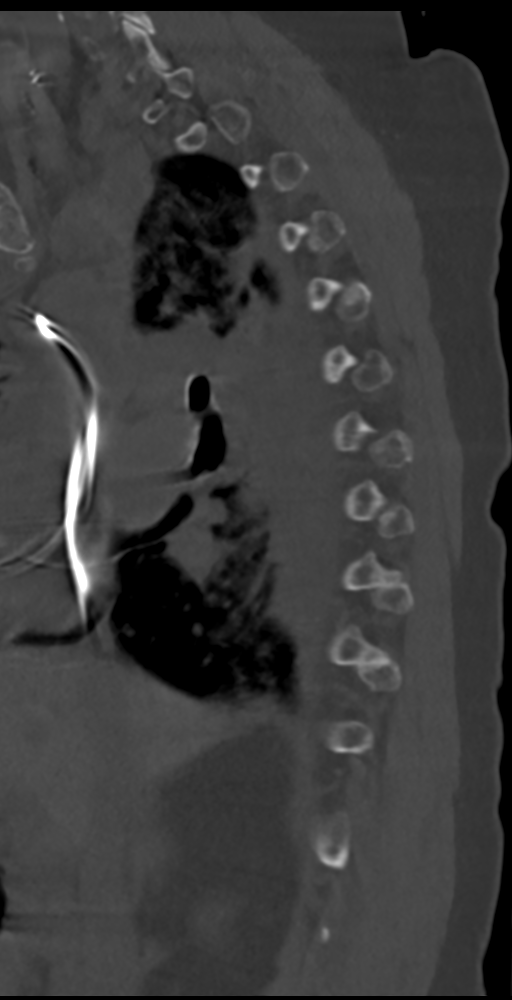
[im 54/65  bone]
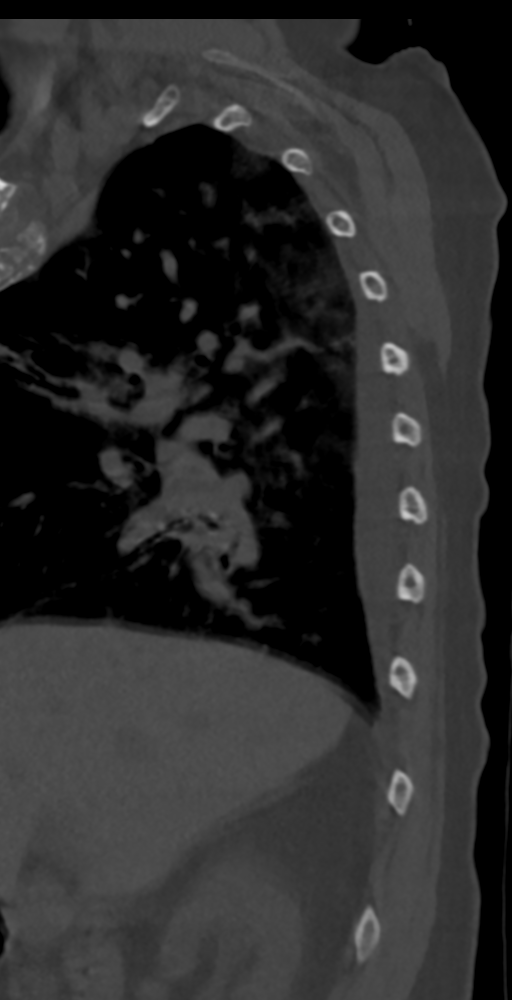

[10 of 34 positions shown; findings below may reference images not displayed]

FINDINGS: Alignment: Normal.

Vertebrae: Interval T12 superior endplate intravertebral disc
herniation, possibly acute. Chronic mild compression deformities of
T12, and the lower cervical vertebra without an acute compression
fracture or fracture lucency. Redemonstration of abnormal diffuse
permeative bone marrow pattern. Suspicious small lytic and sclerotic
lesion measuring 6 mm in the T1 vertebra.

Paraspinal and other soft tissues: Mildly enlarged precarinal lymph
node measuring 12 mm short axis diameter, noncalcified. Cardiomegaly
with pulmonary artery hypertension, cardiac pacemaker, coarse mitral
annulus calcification and coarse coronary calcifications. Small
right 7 Hounsfield unit pleural effusion. Trace left pleural fluid.
Central pulmonary vascular congestion and patchy airspace and small
airways disease in both lungs, bilateral bronchitis, centrilobular
emphysema, and right middle lobe fibrosis, likely sequela from prior
radiation therapy.

Disc levels: No posttraumatic widening or disruption. Intervertebral
vacuum disc phenomenon at T11-T12.
IMPRESSION: Possibly acute superior endplate intravertebral disc herniation at
T12. Other chronic degenerative osseous findings.

Diffuse abnormal permeative bone marrow pattern, possibly aggressive
osteopenia or a benign or malignant marrow infiltrating disease.
Small suspicious 6 mm lytic and sclerotic lesion in the T1 vertebra,
possibly metastatic.

Nonspecific mildly enlarged mediastinal lymph node that could be
benign or metastatic.

Cardiomegaly and pulmonary artery hypertension with coarse coronary
calcifications, trace left pleural effusion, small right pleural
effusion, mild pulmonary edema, bilateral bronchitis and possible
superimposed infectious pneumonitis. Sequela of prior radiation
therapy in the right middle lobe.

Thoracoabdominal aortic calcified atherosclerosis and emphysema.

## 2021-07-04 IMAGING — CT CT HEAD W/O CM
4 series · 16 of 47 positions shown, 18 images · non-contrast
Comparison: January 02, 2019

CLINICAL DATA: Level 2 trauma. Fall hitting the back of the head.

EXAM:
CT HEAD WITHOUT CONTRAST
TECHNIQUE: Contiguous axial images were obtained from the base of the skull
through the vertex without intravenous contrast. Automatic exposure
control utilized.

[Series 3: head wo · axial · 0.42mm/px · z∈[+1312,+1442]mm · 7 of 36 slices shown, 9 images]
[im 5/36  brain]
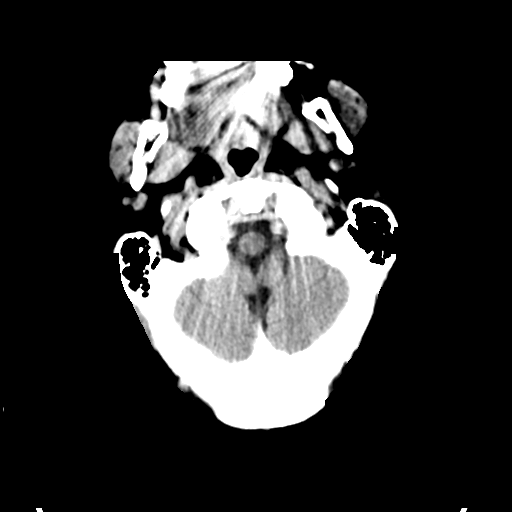
[im 5/36  bone]
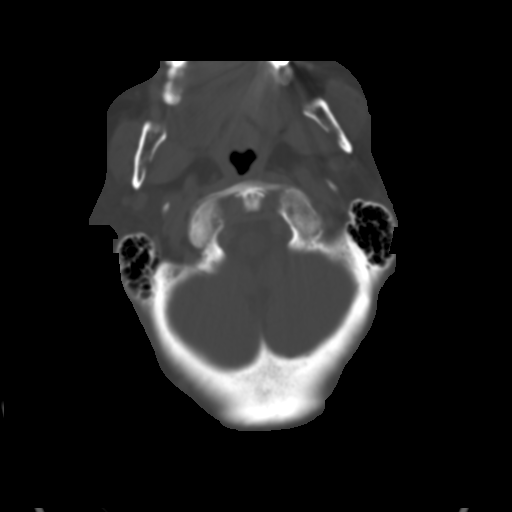
[im 9/36  brain]
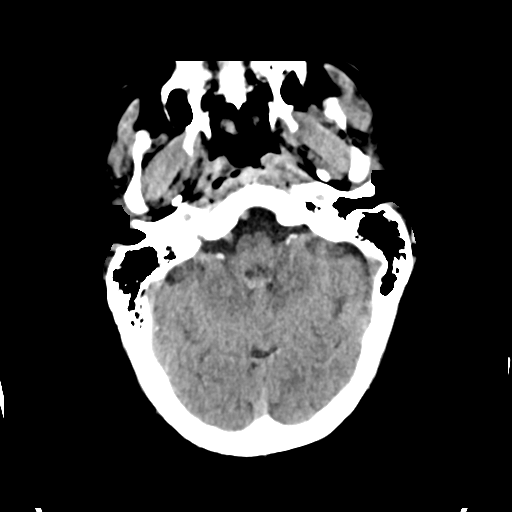
[im 14/36  brain]
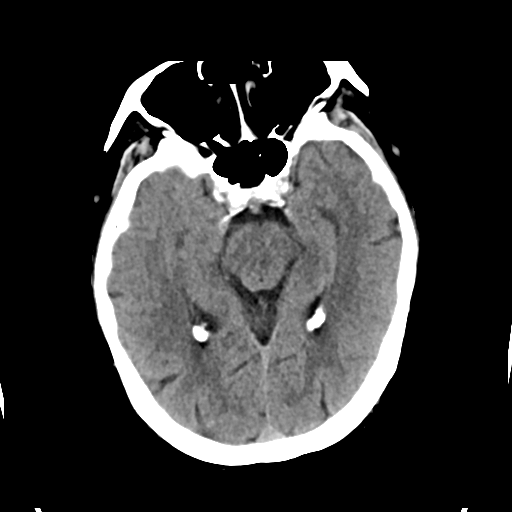
[im 18/36  brain]
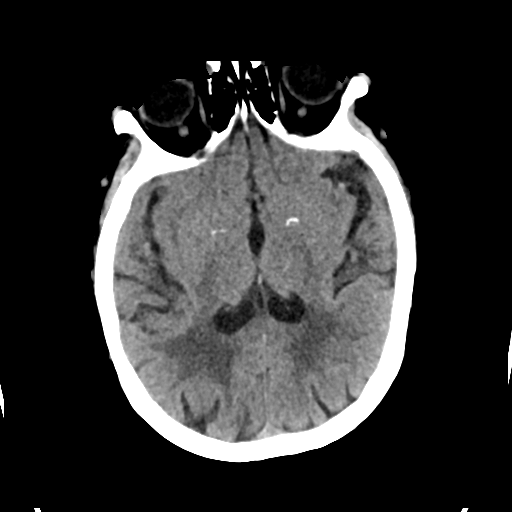
[im 22/36  brain]
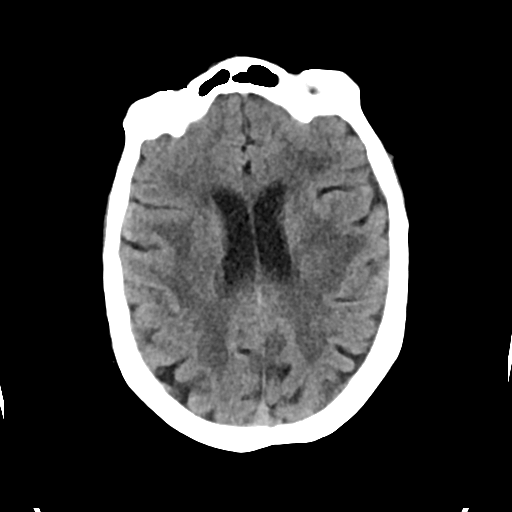
[im 22/36  bone]
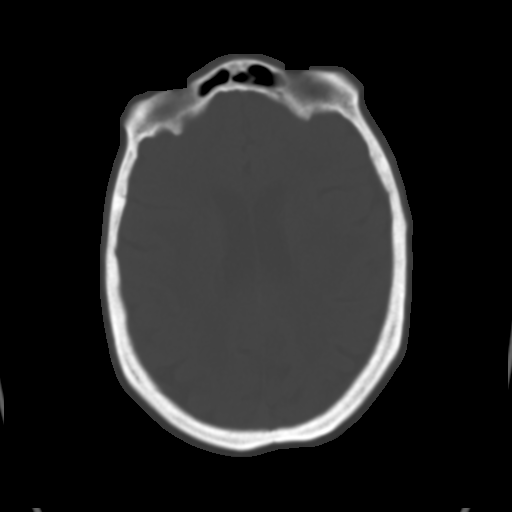
[im 27/36  brain]
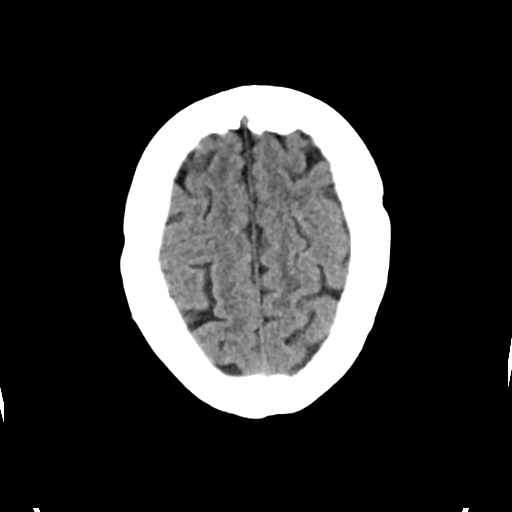
[im 31/36  brain]
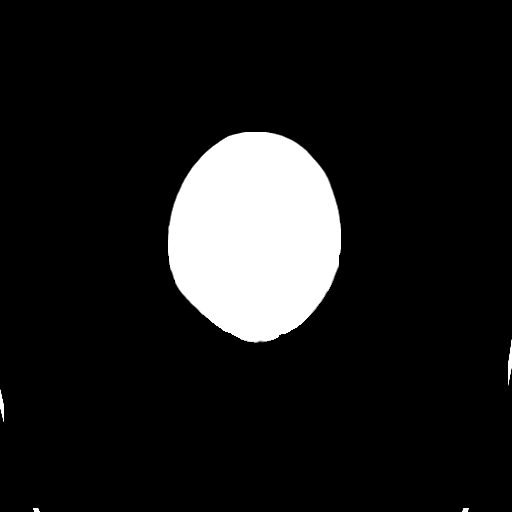

[Series 4: head bone · axial · 0.42mm/px · z∈[+1308,+1344]mm · 3 of 88 slices shown]
[im 9/88  bone]
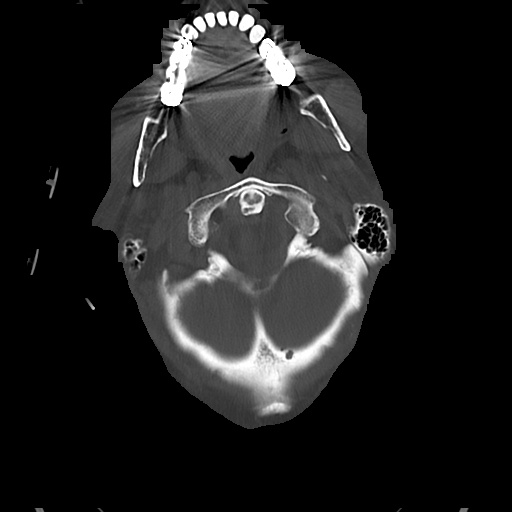
[im 18/88  bone]
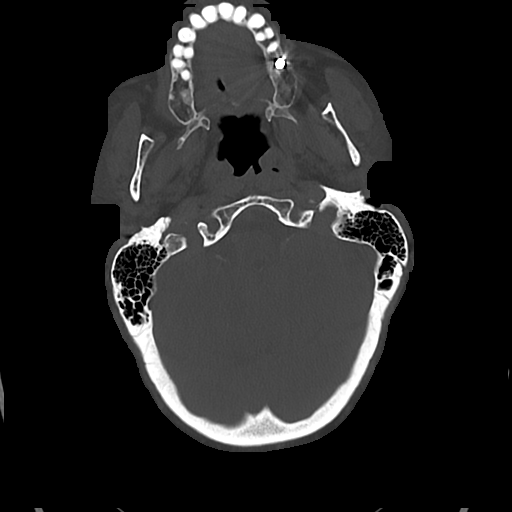
[im 27/88  bone]
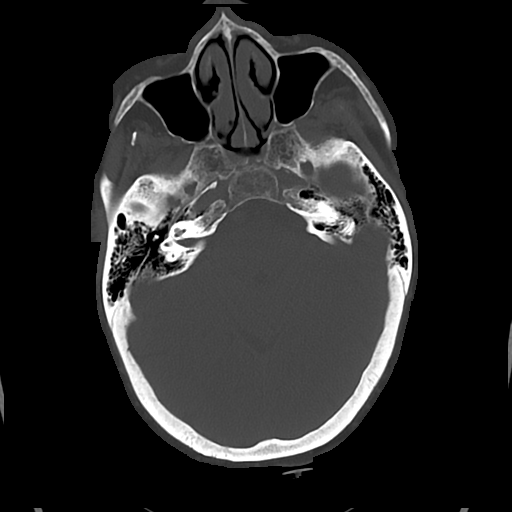

[Series 5: cor soft · coronal · 0.33mm/px · 3 of 70 slices shown]
[im 24/70  brain]
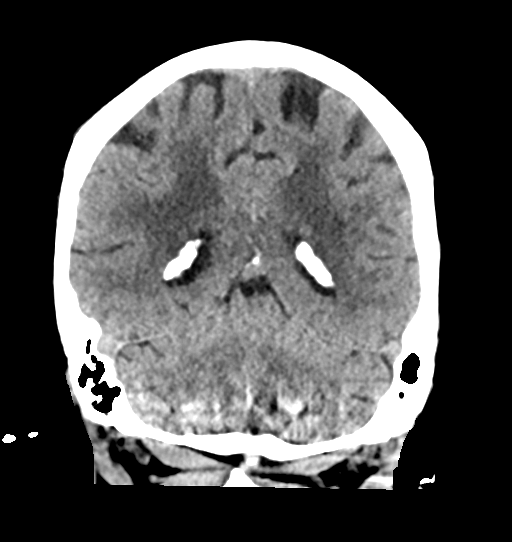
[im 31/70  brain]
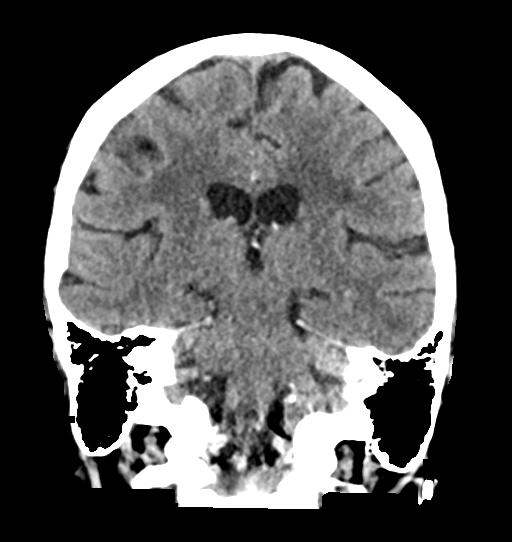
[im 39/70  brain]
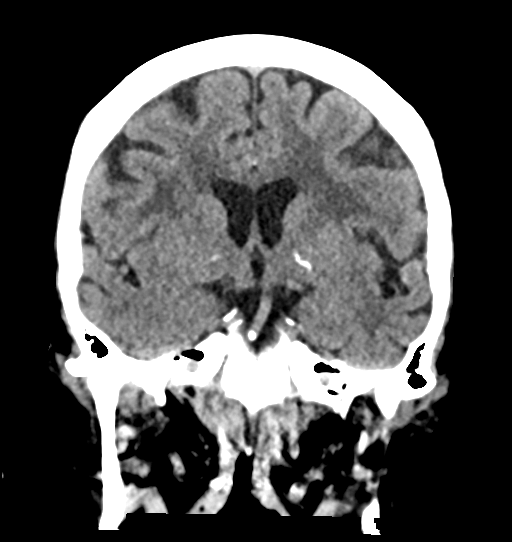

[Series 6: sag soft · sagittal · 0.37mm/px · 3 of 56 slices shown]
[im 19/56  brain]
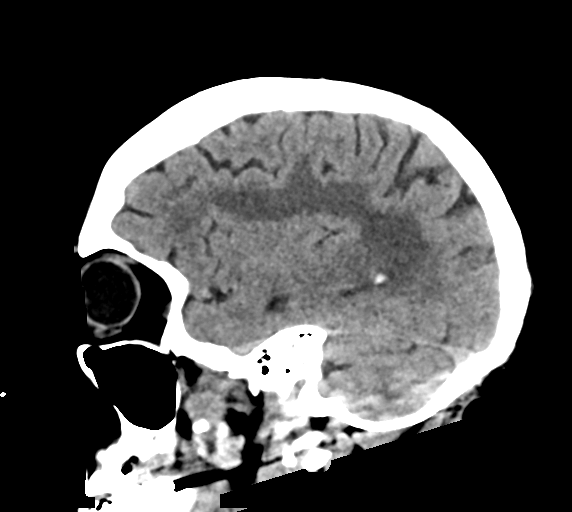
[im 28/56  brain]
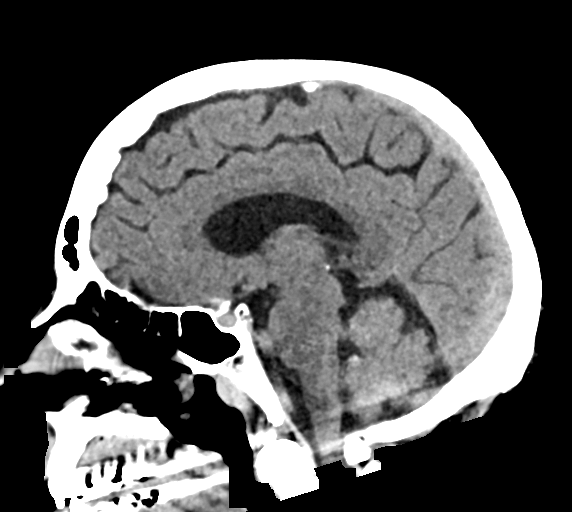
[im 37/56  brain]
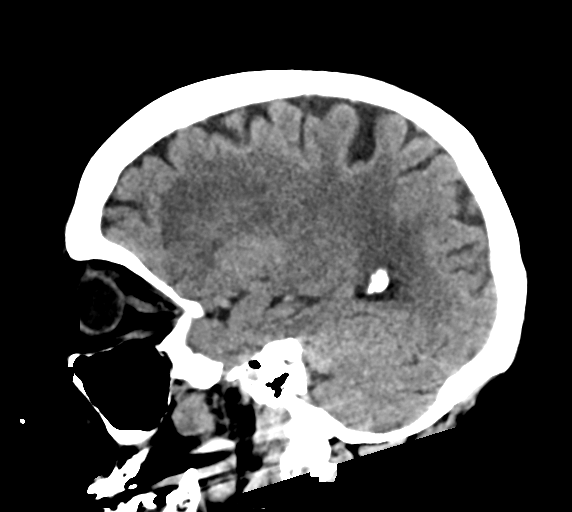

[16 of 47 positions shown; findings below may reference images not displayed]

FINDINGS: Brain: A hyperdense 7 mm focus in the right occipital lobe cortical
gray matter on series 3, image 15 and series 5, image 61 which is
new and concerning for an acute intraparenchymal hemorrhage. A
chronic linear density in the left temporal convexity on series 3,
image 13 is unchanged and probably vascular in etiology. There is no
abnormal extra-axial fluid collection, midline shift, mass effect,
vascular territory distribution infarction. Chronic left caudate
lacunar infarction and bilateral cerebral microvascular white matter
ischemic changes are similar. There is redemonstration of basal
ganglia calcifications.

Vascular: Atherosclerotic calcifications including along a right
sylvian fissure and anterior parafalcine vessel.

Skull: No acute skull fracture. No subgaleal hematoma.

Sinuses/Orbits: Mild bilateral frontal and ethmoidal mucosal
thickening. No posttraumatic air-fluid levels. Normal bilateral
orbital soft tissues.

Other: Metallic dental artifact degrades image quality, particularly
in the posterior fossa.

I discussed critical Value/emergent results and the need for
surveillance imaging by telephone at the time of interpretation on
12/02/2019 at [DATE] with provider BLAYZA NETSSPEN LITTLEJOHN , who verbally
acknowledged these results.
IMPRESSION: A small 7 mm hyperdense focus in the right occipital lobe is
concerning for an acute intraparenchymal hemorrhage. An underlying
hemorrhagic metastatic lesion is a differential consideration.
Surveillance CT brain without and with intravenous contrast
recommended in 2-3 months for further evaluation given the cardiac
pacemaker.

No acute skull fracture.

Chronic microvascular ischemic changes in both cerebral hemispheres
and left lacunar infarction.

## 2021-07-04 IMAGING — CT CT CERVICAL SPINE W/O CM
3 series · 9 of 35 positions shown, 11 images · non-contrast
Comparison: July 29, 2018

CLINICAL DATA: Level 2 trauma. Fall with neck pain.

EXAM:
CT CERVICAL SPINE WITHOUT CONTRAST
TECHNIQUE: Multidetector CT imaging of the cervical spine was performed without
intravenous contrast. Multiplanar CT image reconstructions were also
generated.

[Series 5: c spine soft · axial · 0.30mm/px · z∈[+1246,+1246]mm · 1 of 97 slices shown, 2 images]
[im 52/97  soft-tissue]
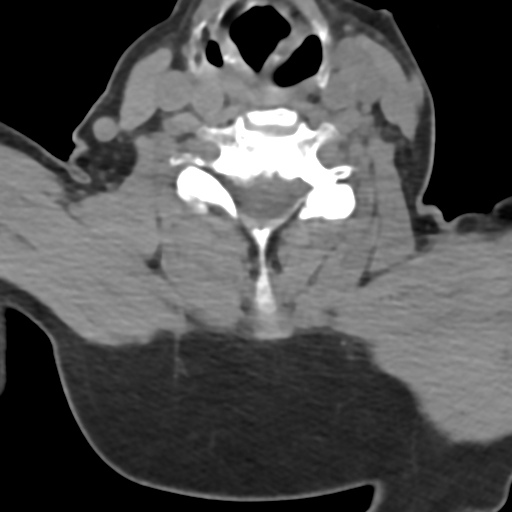
[im 52/97  bone]
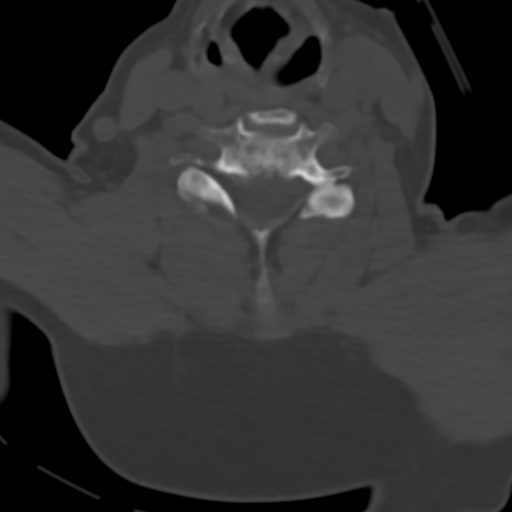

[Series 8: sag bone · sagittal · 0.23mm/px · 5 of 71 slices shown, 6 images]
[im 24/71  bone]
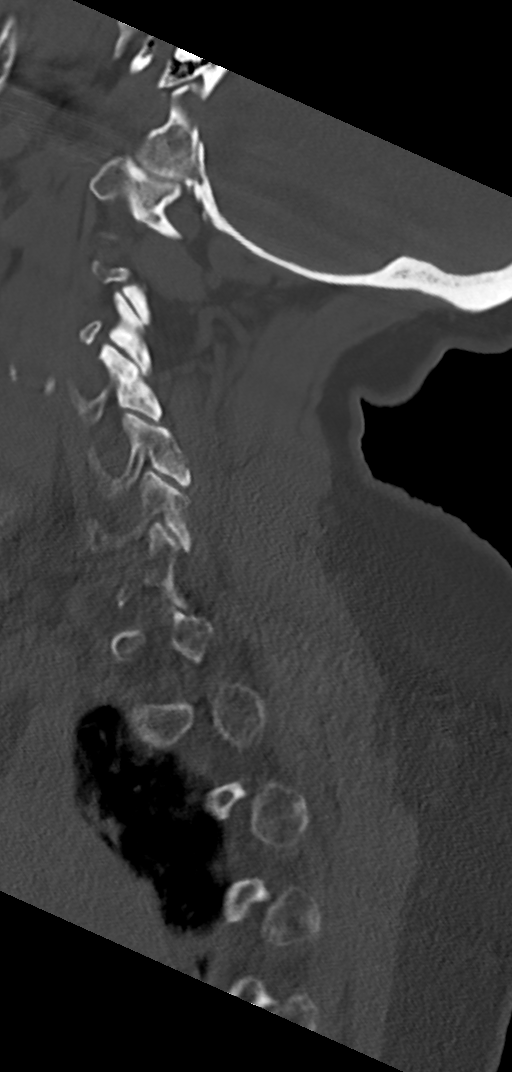
[im 30/71  bone]
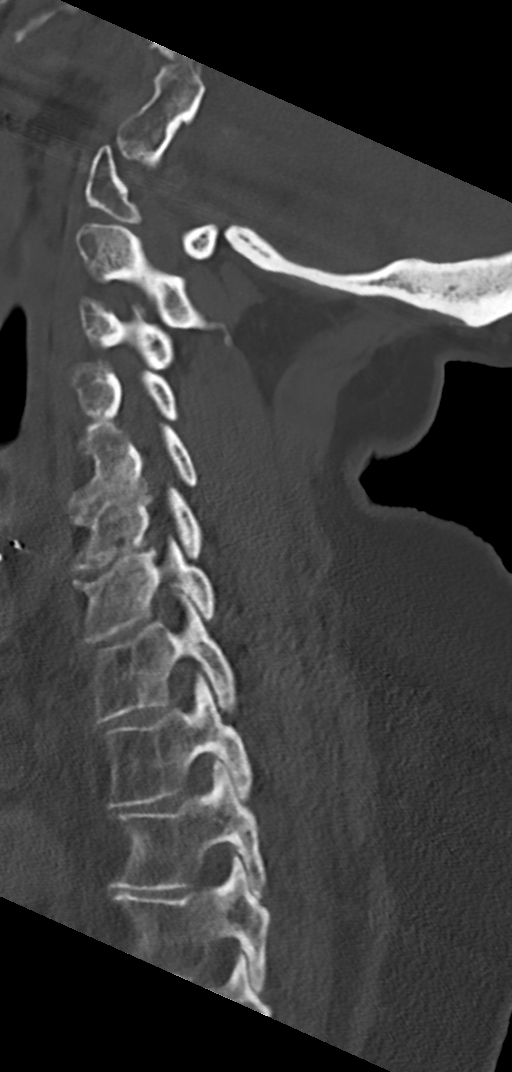
[im 36/71  soft-tissue]
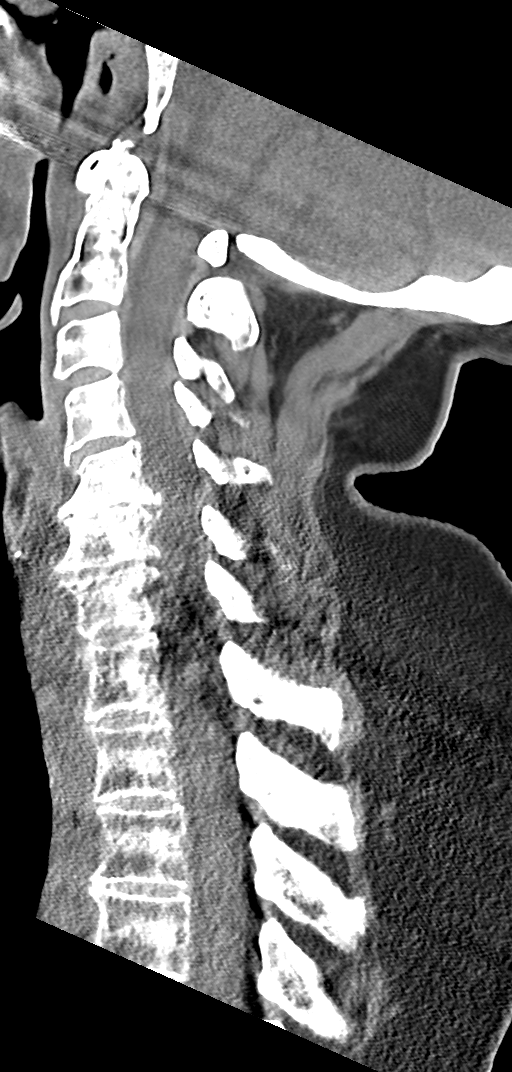
[im 36/71  bone]
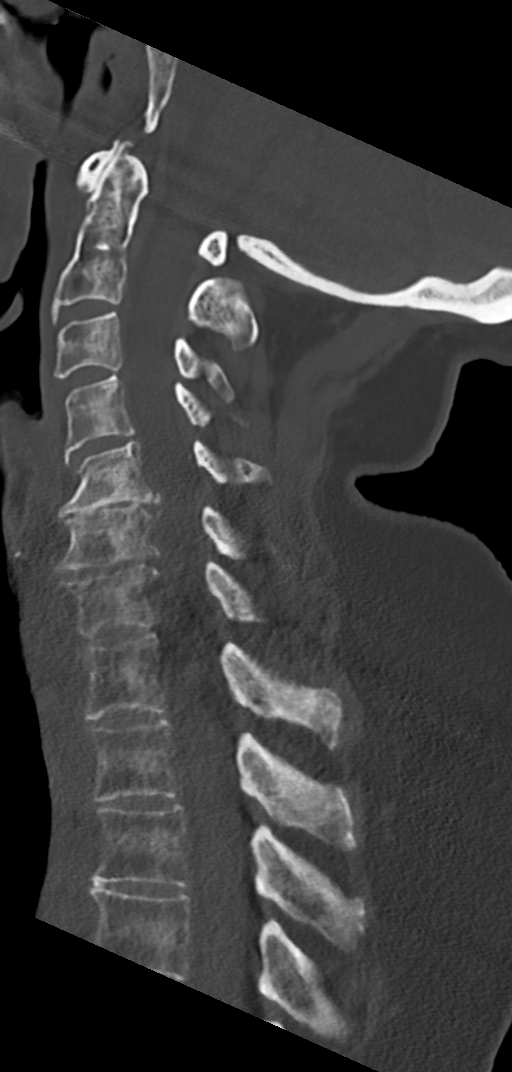
[im 41/71  bone]
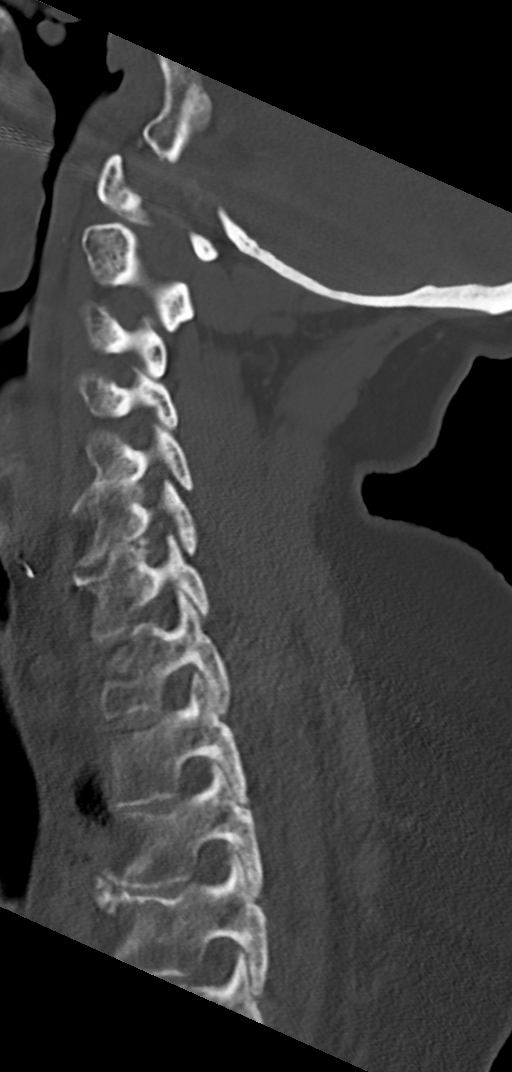
[im 47/71  bone]
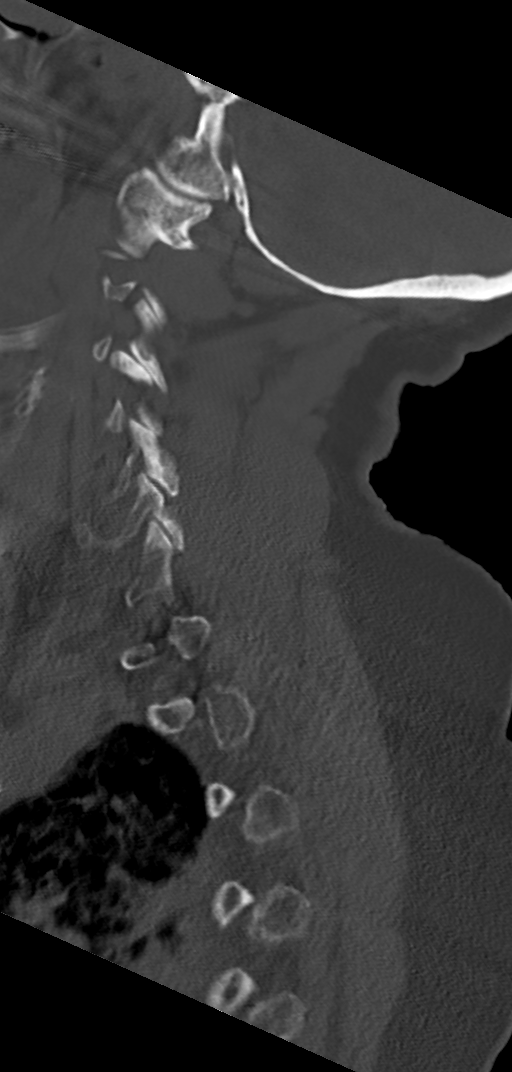

[Series 9: cor bone · coronal · 0.31mm/px · 3 of 65 slices shown]
[im 15/65  bone]
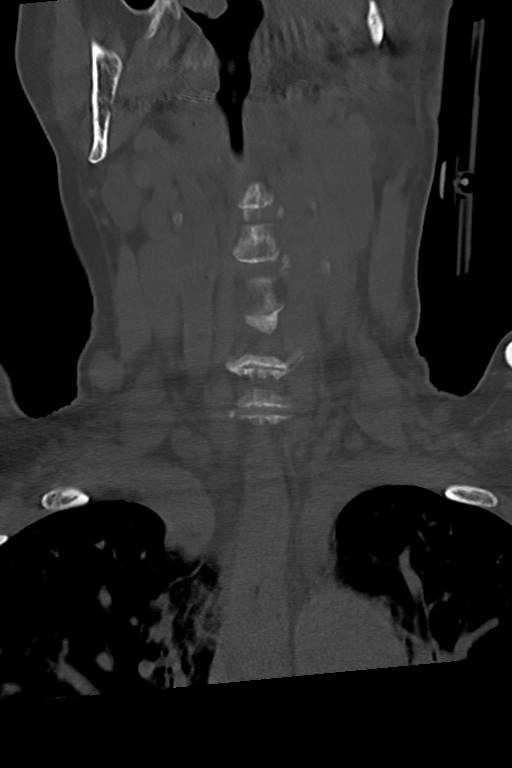
[im 27/65  bone]
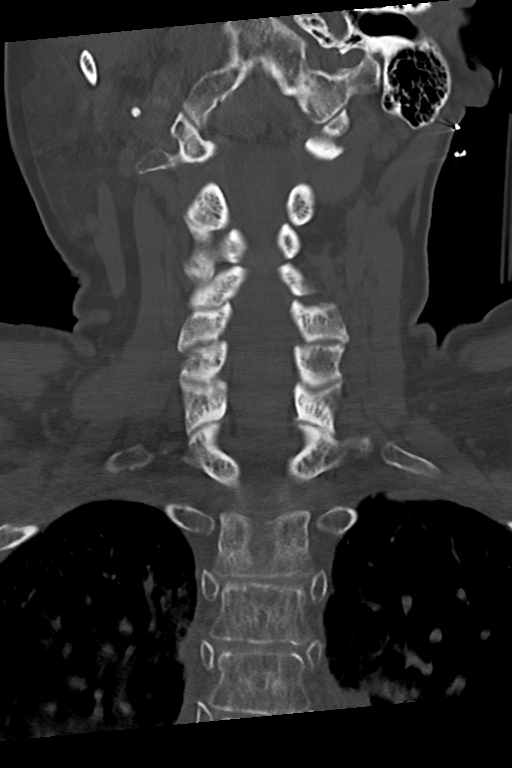
[im 38/65  bone]
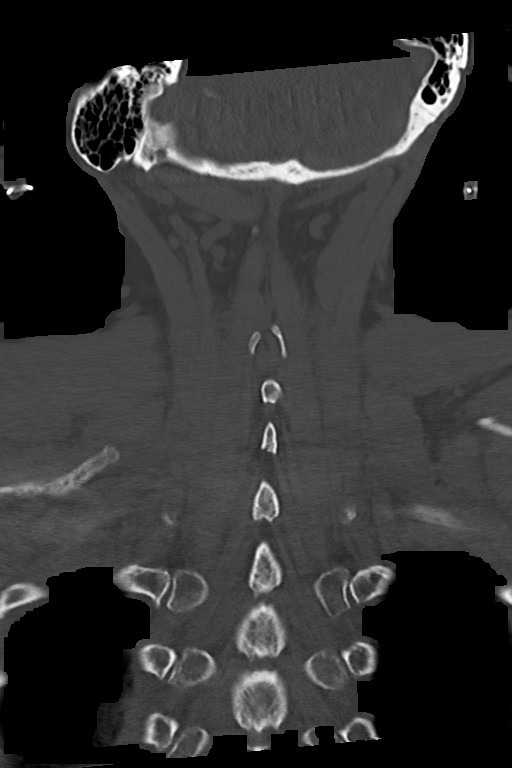

[9 of 35 positions shown; findings below may reference images not displayed]

FINDINGS: Alignment: Degenerative appearing grade 1 retrolisthesis of C5 on C6
and C6 on C7. Reversal of the cervical lordosis.

Skull base and vertebrae: No acute fracture. Degenerative anterior
wedging and endplate moderate hypertrophy of the mid cervical
levels.

Soft tissues and spinal canal: No prevertebral fluid or swelling. No
visible canal hematoma.

Disc levels: No posttraumatic disruption of the disc spaces. Diffuse
cervical degenerative disc space narrowing, advanced from C5 through
C7.

Upper chest: Centrilobular emphysema with bilateral upper lobe
bronchitis, small right 4 Hounsfield unit dependent pleural
effusion, and patchy ground-glass opacity and airspace disease in
the medial upper lobes, possibly infectious. Aortic arch calcified
atherosclerosis. Thyroidectomy surgical clips. Bilateral carotid
calcified atherosclerosis. Partial imaging of a cardiac pacemaker. A
benign granulomatous calcification in the left lung apex.

Other: Diffuse bone demineralization. A 6 mm T1 and a 5 mm C3 ovoid
lytic lesion, concerning for possible metastatic disease to the
skeleton.
IMPRESSION: 1. No acute fracture or malalignment of the cervical spine.
2. Two suspicious small lytic lesions in the T1 and C3 vertebra,
possibly osteolytic metastasis to the skeleton.
3. Centrilobular emphysema with bilateral upper lobe bronchitis,
small right pleural effusion, and patchy pulmonary disease, possibly
infectious.
4. Aortic atherosclerosis (937QD-[id number]) and Emphysema ([id number]-WUG.[id number]).
5. Advanced cervical degenerative disc disease.
6. Diffuse bone demineralization.

## 2021-07-05 IMAGING — MR MR HEAD WO/W CM
12 of 15 series · 39 of 48 positions shown · IV contrast (gadavist)
Comparison: CT head 12/02/2019

CLINICAL DATA: Fall with head injury.  History of lymphoma

EXAM:
MRI HEAD WITHOUT AND WITH CONTRAST
TECHNIQUE: Multiplanar, multiecho pulse sequences of the brain and surrounding
structures were obtained without and with intravenous contrast.
CONTRAST:  10mL GADAVIST GADOBUTROL 1 MMOL/ML IV SOLN

[Series 5: DWI · axial · 3.0mm · 0.88mm/px · z∈[-63,+68]mm · 7 of 92 slices shown (1 of 4)]
[im 1/92]
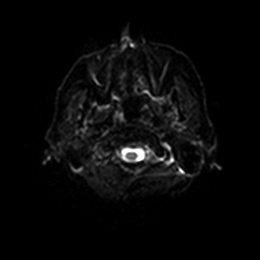
[im 16/92]
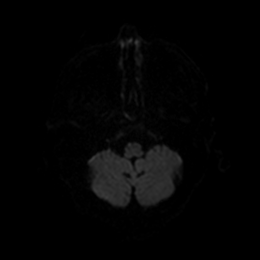
[im 31/92]
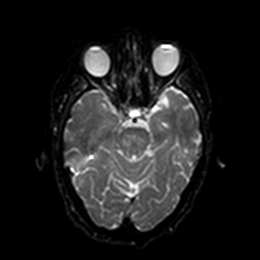
[im 46/92]
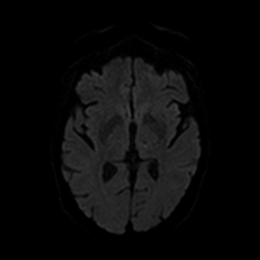
[im 61/92]
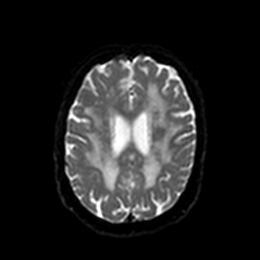
[im 76/92]
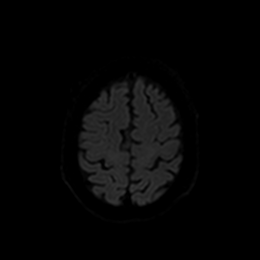
[im 92/92]
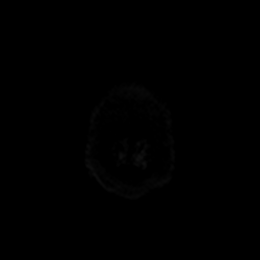

[Series 6: DWI · axial · 3.0mm · 0.88mm/px · z∈[-63,+68]mm · 4 of 46 slices shown (2 of 4)]
[im 1/46]
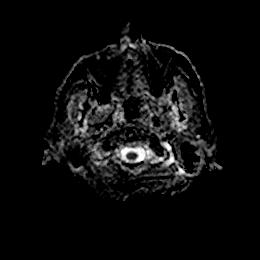
[im 16/46]
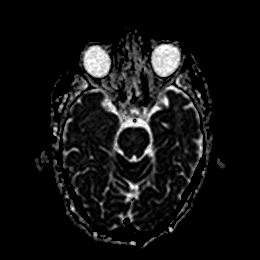
[im 31/46]
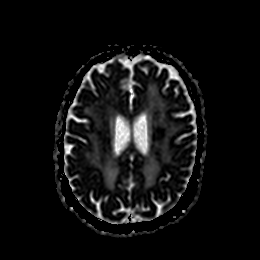
[im 46/46]
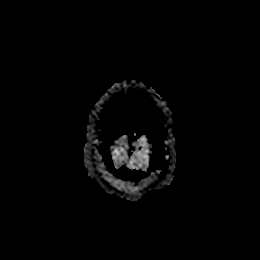

[Series 7: DWI · coronal · 4.0mm · 0.88mm/px · 5 of 64 slices shown (3 of 4)]
[im 1/64]
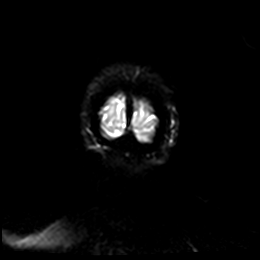
[im 16/64]
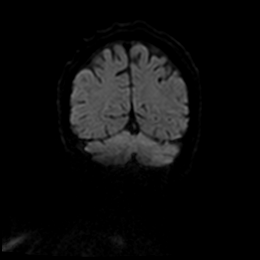
[im 32/64]
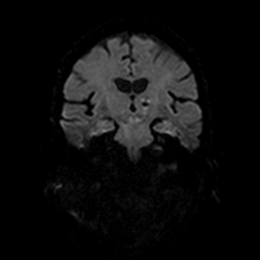
[im 48/64]
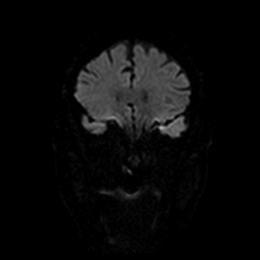
[im 64/64]
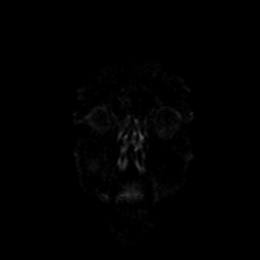

[Series 8: DWI · coronal · 4.0mm · 0.88mm/px · 3 of 32 slices shown (4 of 4)]
[im 1/32]
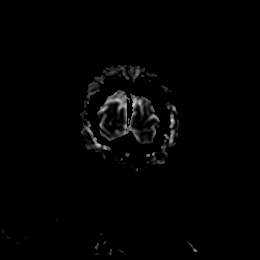
[im 16/32]
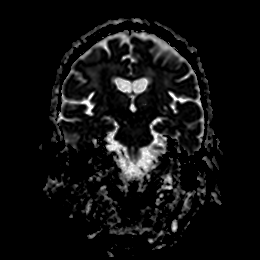
[im 32/32]
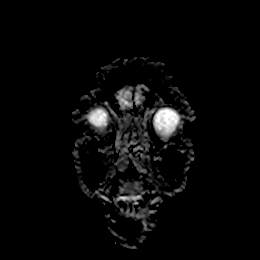

[Series 9: T1 · sagittal · 5.0mm · 0.75mm/px · 2 of 23 slices shown]
[im 1/23]
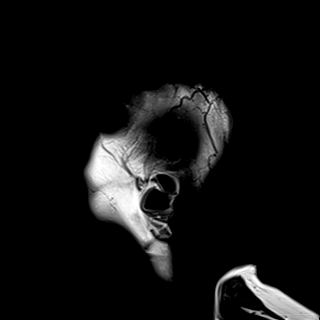
[im 23/23]
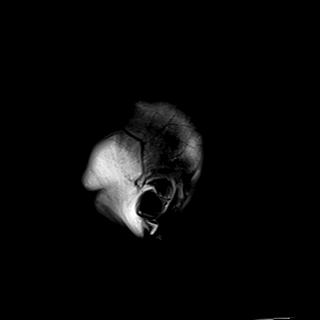

[Series 10: T2 · axial · 5.0mm · 0.72mm/px · z∈[-65,+74]mm · 2 of 25 slices shown]
[im 1/25]
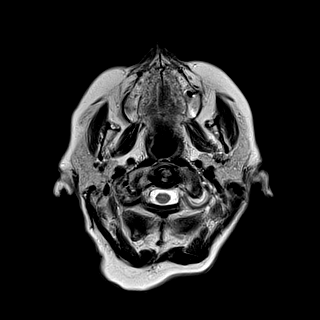
[im 25/25]
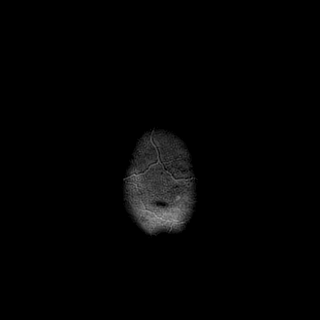

[Series 11: FLAIR · axial · 5.0mm · 0.45mm/px · z∈[-61,+78]mm · 2 of 25 slices shown]
[im 1/25]
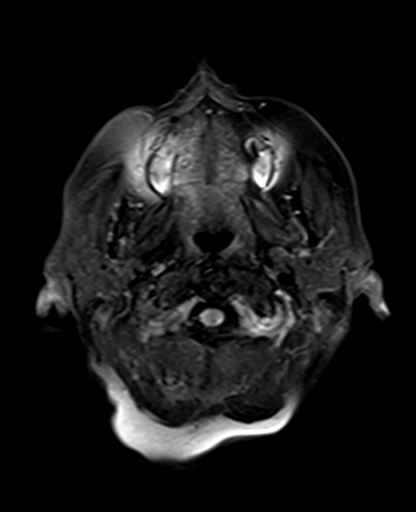
[im 25/25]
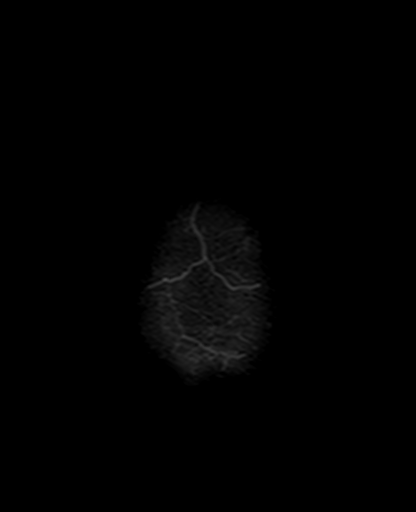

[Series 13: pha_images · axial · 3.0mm · 0.90mm/px · z∈[-68,+80]mm · 4 of 52 slices shown]
[im 1/52]
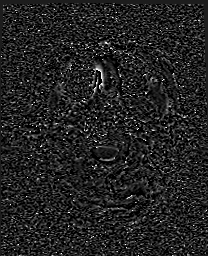
[im 18/52]
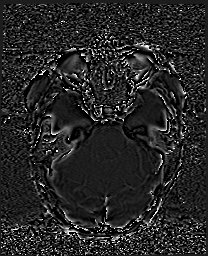
[im 35/52]
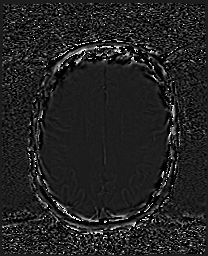
[im 52/52]
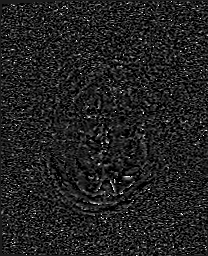

[Series 14: swi_images · axial · 3.0mm · 0.90mm/px · z∈[-68,+80]mm · 4 of 52 slices shown]
[im 1/52]
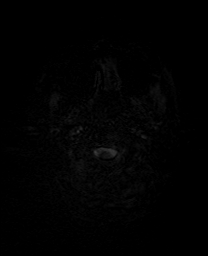
[im 18/52]
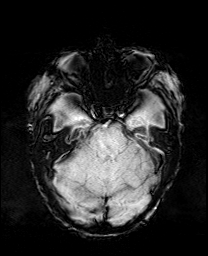
[im 35/52]
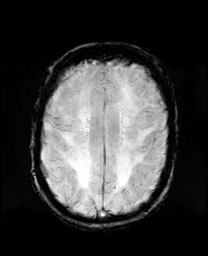
[im 52/52]
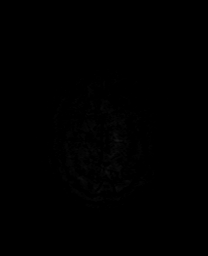

[Series 17: T2 post-contrast · coronal · 5.0mm · 0.72mm/px · 2 of 28 slices shown]
[im 1/28]
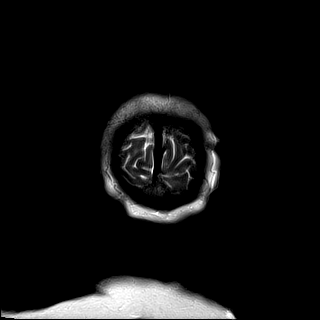
[im 28/28]
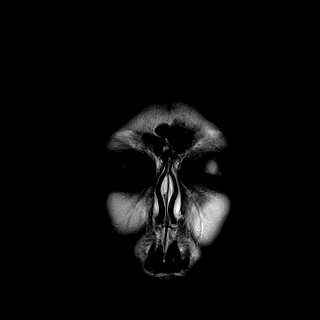

[Series 19: T1 post-contrast · coronal · 5.0mm · 0.34mm/px · 2 of 28 slices shown (1 of 2)]
[im 1/28]
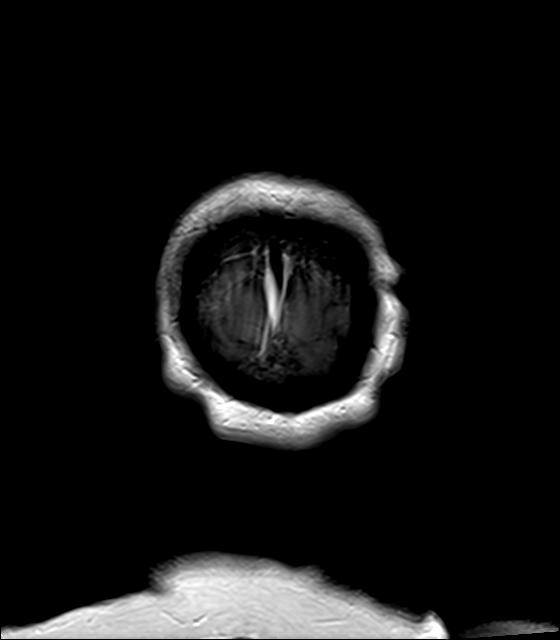
[im 28/28]
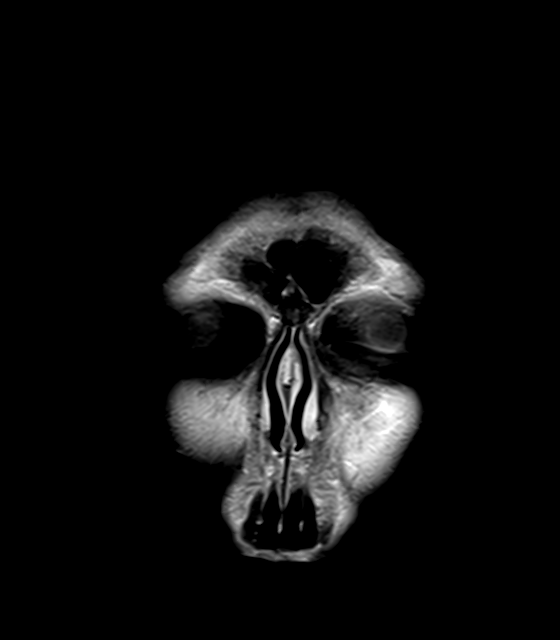

[Series 20: T1 post-contrast · sagittal · 5.0mm · 0.72mm/px · 2 of 23 slices shown (2 of 2)]
[im 1/23]
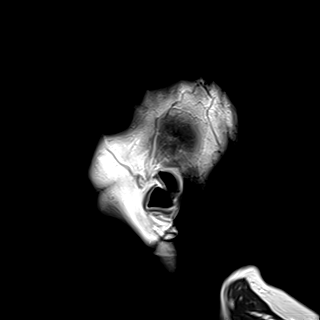
[im 23/23]
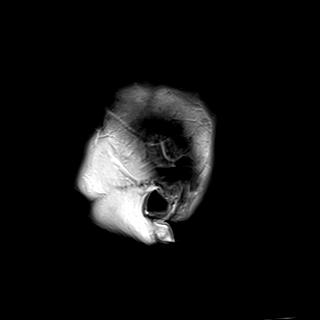

[39 of 48 positions shown; findings below may reference images not displayed]

FINDINGS: Brain: Negative for acute infarct. No area of hemorrhage in the
right occipital lobe as questioned on CT. No subdural hematoma.

Atrophy and chronic microvascular ischemic changes throughout the
white matter. Chronic microhemorrhage left thalamus. No fluid
collection or midline shift

Normal enhancement postcontrast infusion.

Vascular: Normal arterial flow voids

Skull and upper cervical spine: No focal skeletal lesion.

Sinuses/Orbits: Paranasal sinuses clear. Bilateral cataract
extraction.

Other: None
IMPRESSION: No acute abnormality. No acute infarct or mass. No evidence of acute
hemorrhage in the right occipital lobe as questioned on CT

Atrophy with moderate chronic microvascular ischemic changes in the
white matter. Chronic microhemorrhage left thalamus likely due to
hypertension.

## 2021-07-05 IMAGING — MR MR CERVICAL SPINE WO/W CM
6 of 8 series · 30 of 48 positions shown · IV contrast (gadavist)
Comparison: CT cervical spine 12/02/2019. MRI cervical spine
07/29/2018

CLINICAL DATA: Fall.  Head injury.  History of lymphoma.

EXAM:
MRI CERVICAL SPINE WITHOUT AND WITH CONTRAST
TECHNIQUE: Multiplanar and multiecho pulse sequences of the cervical spine, to
include the craniocervical junction and cervicothoracic junction,
were obtained without and with intravenous contrast.
CONTRAST:  10mL GADAVIST GADOBUTROL 1 MMOL/ML IV SOLN

[Series 6: T2 · sagittal · 3.0mm · 0.56mm/px · 4 of 15 slices shown (1 of 2)]
[im 1/15]
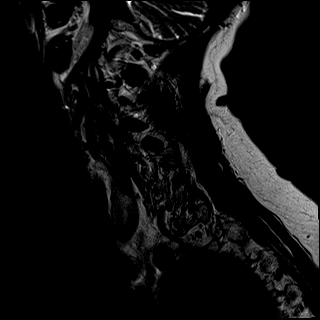
[im 5/15]
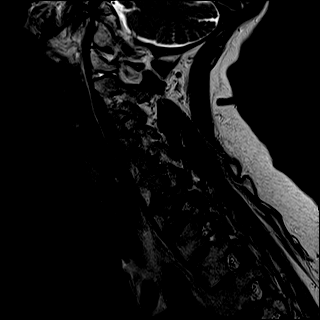
[im 10/15]
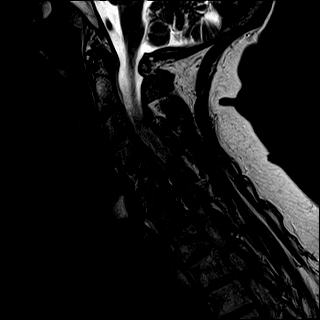
[im 15/15]
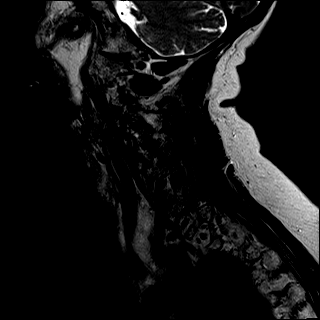

[Series 7: T1 · sagittal · 3.0mm · 0.56mm/px · 3 of 15 slices shown (1 of 2)]
[im 1/15]
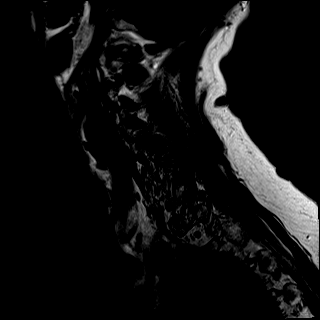
[im 8/15]
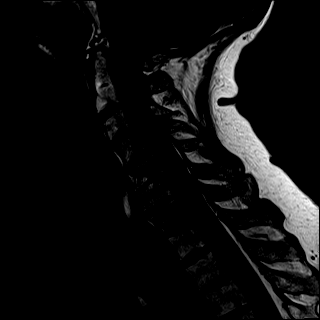
[im 15/15]
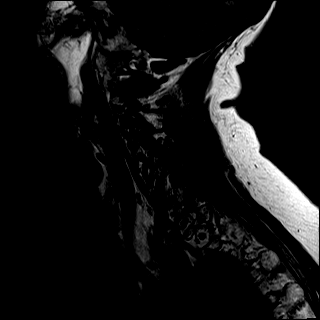

[Series 8: STIR · sagittal · 3.0mm · 0.70mm/px · 3 of 15 slices shown]
[im 1/15]
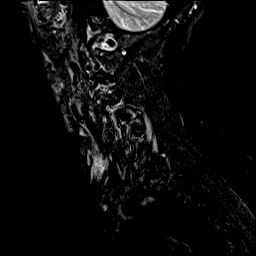
[im 8/15]
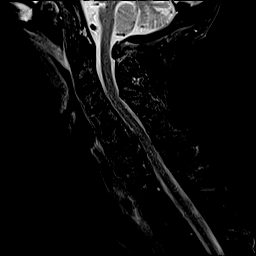
[im 15/15]
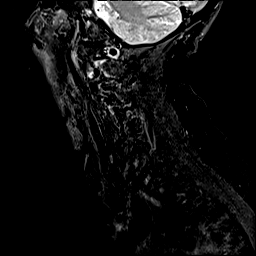

[Series 9: T2 · axial · 3.0mm · 0.66mm/px · z∈[-200,-75]mm · 9 of 40 slices shown (2 of 2)]
[im 1/40]
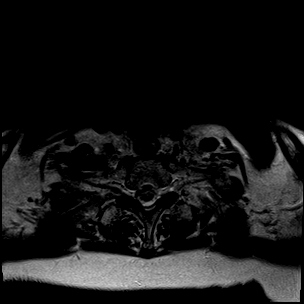
[im 5/40]
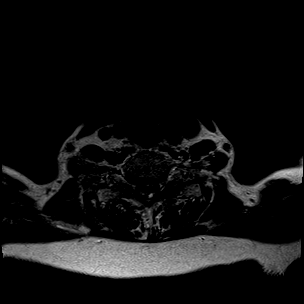
[im 10/40]
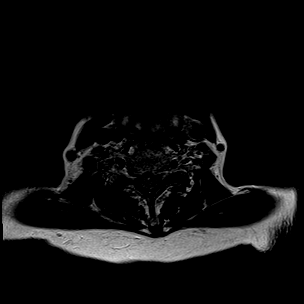
[im 15/40]
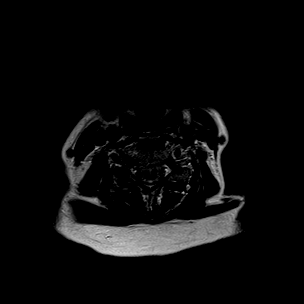
[im 20/40]
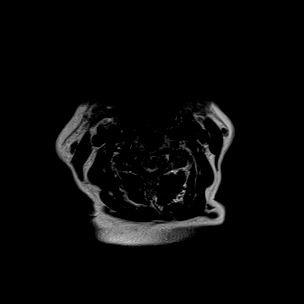
[im 25/40]
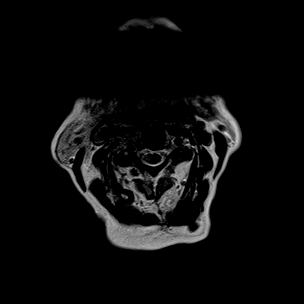
[im 30/40]
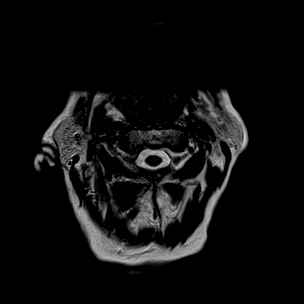
[im 35/40]
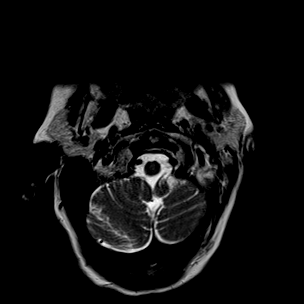
[im 40/40]
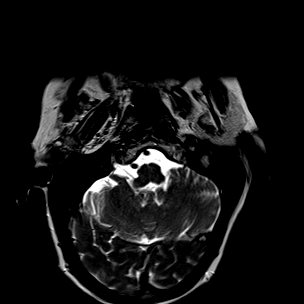

[Series 11: T1 · axial · 3.0mm · 0.39mm/px · z∈[-209,-84]mm · 8 of 37 slices shown (2 of 2)]
[im 1/37]
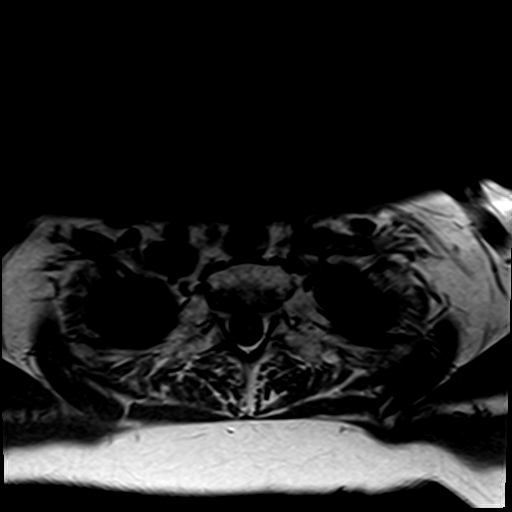
[im 6/37]
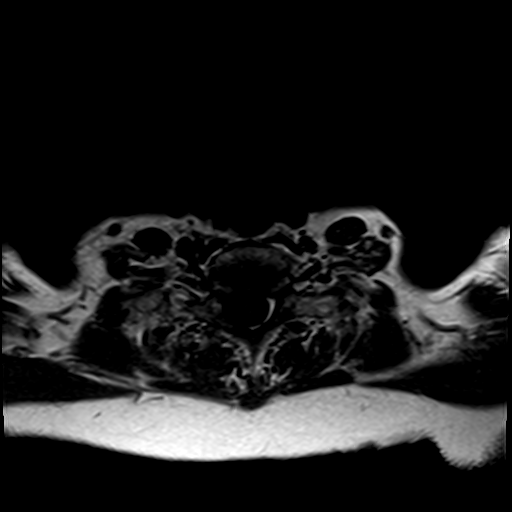
[im 11/37]
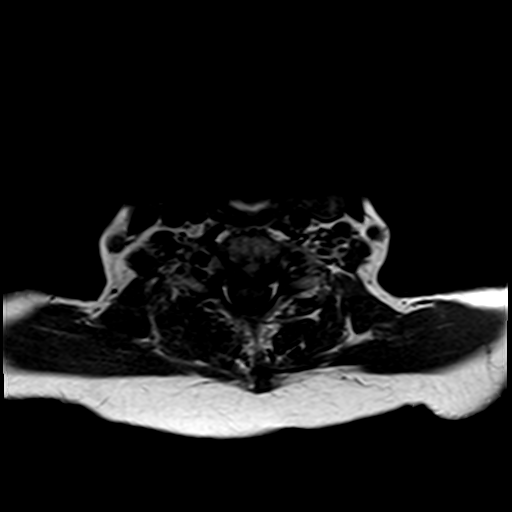
[im 16/37]
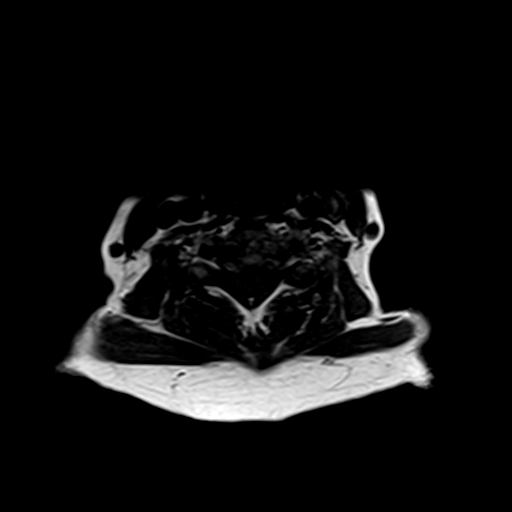
[im 21/37]
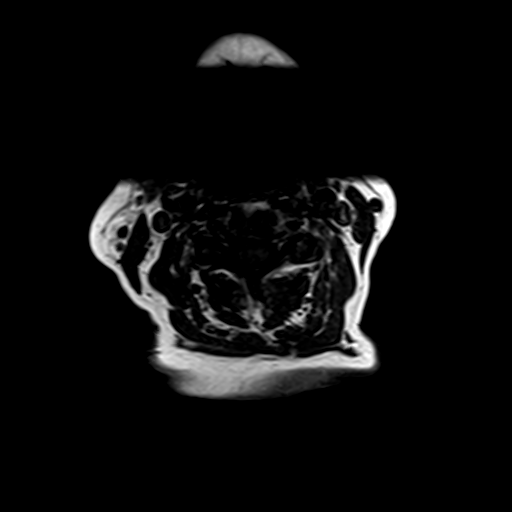
[im 26/37]
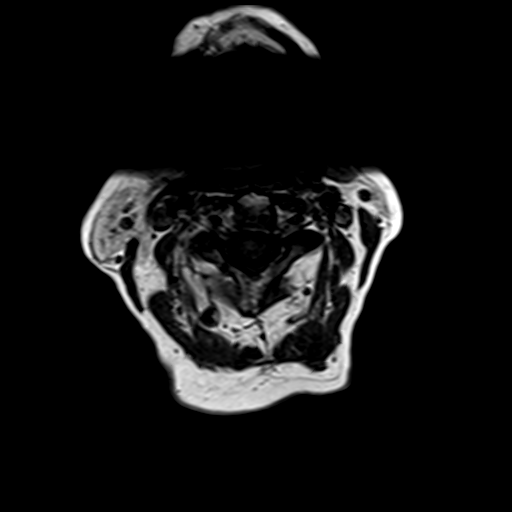
[im 31/37]
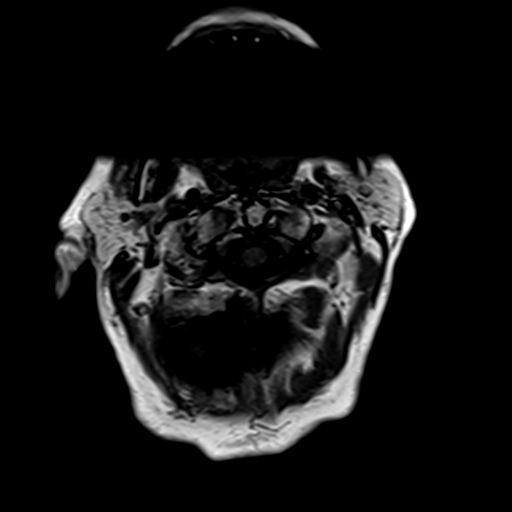
[im 37/37]
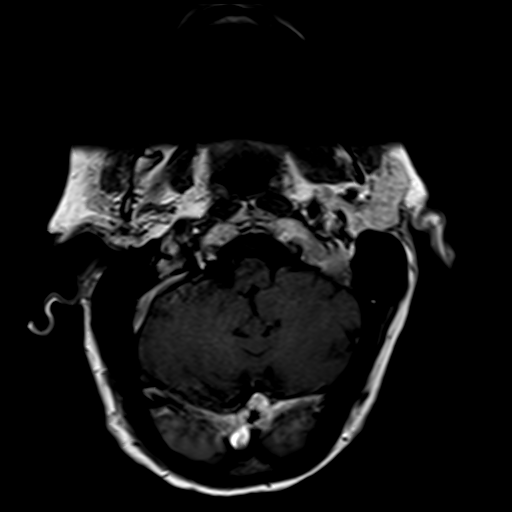

[Series 12: T1 fat-sat post-contrast · sagittal · 3.0mm · 0.43mm/px · 3 of 15 slices shown]
[im 1/15]
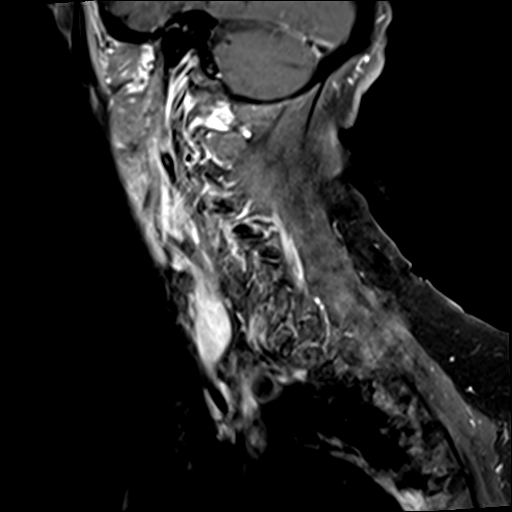
[im 8/15]
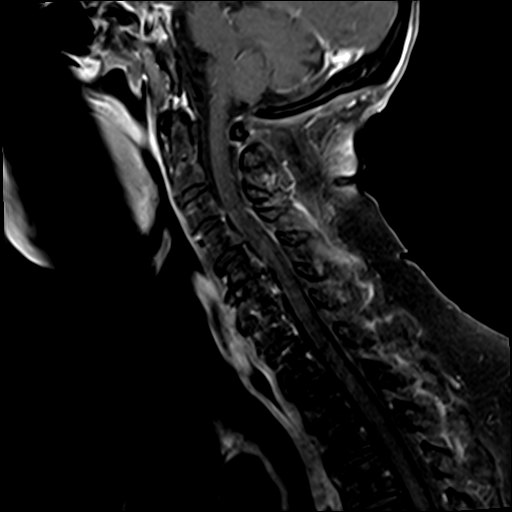
[im 15/15]
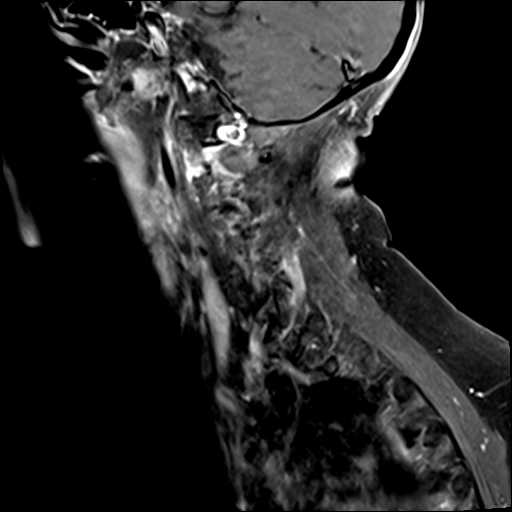

[30 of 48 positions shown; findings below may reference images not displayed]

FINDINGS: Alignment: Motion degraded study

Normal alignment with straightening of the cervical lordosis

Vertebrae: Negative for fracture or mass. No bone marrow lesion or
abnormal enhancement. Small lesion in the T1 vertebral body question
on CT does not show significant marrow abnormality on MRI and does
not show any abnormal enhancement.

Cord: Allowing for motion, no cord signal abnormality or abnormal
enhancement is identified.

Posterior Fossa, vertebral arteries, paraspinal tissues: No cervical
adenopathy or mass.

Disc levels:

C2-3: Negative

C3-4: Mild disc degeneration and spurring. Mild facet degeneration.
Mild foraminal narrowing bilaterally

C4-5: Disc degeneration and mild spurring.  No significant stenosis

C5-6: Disc degeneration and moderate uncinate spurring centrally and
to the right. Cord flattening on the right with mild spinal
stenosis. Mild foraminal narrowing bilaterally due to spurring

C6-7: Small left paracentral disc protrusion and spurring with cord
flattening on the left with mild spinal stenosis. Mild to moderate
foraminal stenosis bilaterally

C7-T1: Mild disc degeneration and spurring
IMPRESSION: Negative for cervical spine fracture or mass. T1 vertebral body
lesion on CT does not appear to represent tumor on MRI

Cervical spondylosis with spinal and foraminal stenosis as above.

## 2021-07-11 ENCOUNTER — Inpatient Hospital Stay: Payer: Medicare Other | Admitting: Neurology

## 2021-07-14 DIAGNOSIS — Z23 Encounter for immunization: Secondary | ICD-10-CM | POA: Diagnosis not present

## 2021-07-18 ENCOUNTER — Encounter: Payer: Self-pay | Admitting: Occupational Therapy

## 2021-07-18 ENCOUNTER — Ambulatory Visit: Payer: Medicare Other | Attending: Neurology | Admitting: Occupational Therapy

## 2021-07-18 ENCOUNTER — Other Ambulatory Visit: Payer: Self-pay

## 2021-07-18 ENCOUNTER — Encounter: Payer: Self-pay | Admitting: Physical Therapy

## 2021-07-18 ENCOUNTER — Ambulatory Visit: Payer: Medicare Other | Admitting: Physical Therapy

## 2021-07-18 VITALS — BP 130/100

## 2021-07-18 DIAGNOSIS — R42 Dizziness and giddiness: Secondary | ICD-10-CM | POA: Diagnosis not present

## 2021-07-18 DIAGNOSIS — H5410 Blindness, one eye, low vision other eye, unspecified eyes: Secondary | ICD-10-CM | POA: Insufficient documentation

## 2021-07-18 DIAGNOSIS — M6281 Muscle weakness (generalized): Secondary | ICD-10-CM

## 2021-07-18 DIAGNOSIS — I629 Nontraumatic intracranial hemorrhage, unspecified: Secondary | ICD-10-CM | POA: Diagnosis not present

## 2021-07-18 DIAGNOSIS — R2689 Other abnormalities of gait and mobility: Secondary | ICD-10-CM | POA: Insufficient documentation

## 2021-07-18 DIAGNOSIS — R296 Repeated falls: Secondary | ICD-10-CM | POA: Diagnosis not present

## 2021-07-18 DIAGNOSIS — R2681 Unsteadiness on feet: Secondary | ICD-10-CM | POA: Insufficient documentation

## 2021-07-18 DIAGNOSIS — R41840 Attention and concentration deficit: Secondary | ICD-10-CM | POA: Diagnosis not present

## 2021-07-18 NOTE — Patient Instructions (Addendum)
Access Code: [email or web address] URL: [email or web address] Date: 07/18/2021 Prepared by: Simonne Come  Exercises Seated Shoulder Horizontal Abduction with Resistance - 3 x daily - 7 x weekly - 3 sets - 10 reps Seated Diagonal Reach with Resistance - 3 x daily - 7 x weekly - 3 sets - 10 reps Seated Elbow Flexion with Self-Anchored Resistance - 3 x daily - 7 x weekly - 3 sets - 10 reps Seated Elbow Extension with Self-Anchored Resistance - 3 x daily - 7 x weekly - 3 sets - 10 reps  Patient Education Proofreader

## 2021-07-18 NOTE — Therapy (Signed)
[facility] [place], [place], [place], [address] Phone: [phone]   Fax:  [phone]  Occupational Therapy Evaluation  Patient Details  Name: Kayla Harrison MRN: [id number] Date of Birth: 01-30-1946 Referring Provider (OT): Leonie Man   Encounter Date: 07/18/2021   OT End of Session - 07/18/21 1128     Visit Number 1    Number of Visits 17    Date for OT Re-Evaluation 09/12/21    Authorization Type Medicare A and B    OT Start Time 1749    OT Stop Time 1100    OT Time Calculation (min) 45 min    Activity Tolerance Patient tolerated treatment well    Behavior During Therapy WFL for tasks assessed/performed             Past Medical History:  Diagnosis Date   Anemia    Arthritis    osteoarthritis - knees and right shoulder   Blood transfusion without reported diagnosis    Breast cancer ([place])    Dr Margot Chimes, total thyroidectomy- 1999- for cancer   Brucellosis 1964   Chronic bilateral pleural effusions    Colon polyp    Dr Earlean Shawl   Complication of anesthesia    Ketamine produces LSD reaction, bright colored nightmarish experience    Dyslipidemia    Endometriosis    Fibroids    H/O pleural effusion    s/p thoracentesis w 3235m withdrawn   Hepatitis    [place] ?hepatitis    History of dysphagia    due to radiation therapy   History of hiatal hernia    small noted on PET scan   Hypertension    Hypothyroidism    Lung cancer, lower lobe ([place] 01/2017   radiation RX completed 03/04/17; will start chemo 6/27, pt unaware of lung cancer   Morbid obesity ([place]    Status post lap band surgery   Nephrolithiasis    Non Hodgkin's lymphoma ([place]    on chemotherapy   Persistent atrial fibrillation ([facility]    a. s/p PVI 2008 b. s/p convergent ablation 24496complicated by bradycardia requiring pacemaker implant   Personal history of radiation therapy    Presence of permanent cardiac  pacemaker    Rotator cuff tear    Right   Stroke ([place]   SVC syndrome    with lung mass and non hodgkins lymphoma   Thyroid cancer ([place] 2000    Past Surgical History:  Procedure Laterality Date   ABDOMINAL HYSTERECTOMY  1983   afib ablation     a. 2008 PVI b. 2014 convergent ablation   APPENDECTOMY     BONE MARROW BIOPSY  02/21/2017   BREAST LUMPECTOMY Left 2010   [place]    2015- negative   CARDIOVERSION  10/09/2012   Procedure: CARDIOVERSION;  Surgeon: JMinus Breeding MD;  [place] [facility]  Service: Cardiovascular;  Laterality: N/A;   CARDIOVERSION  10/09/2012   Procedure: CARDIOVERSION;  Surgeon: JMinus Breeding MD;  [facility]  Service: Cardiovascular;  Laterality: N/A;  [name] the ok to add pt to the add on , but we must check to find out if the can add pt on at 1400 [phone]   CARDIOVERSION N/A 11/20/2012   Procedure: CARDIOVERSION;  Surgeon: PFay Records MD;  [facility]  Service: Cardiovascular;  Laterality: N/A;  CARDIOVERSION N/A 07/18/2017   Procedure: CARDIOVERSION;  Surgeon: Acie Fredrickson, Wonda Cheng, MD;  Location: [facility];  Service: Cardiovascular;  Laterality: N/A;   CARDIOVERSION N/A 10/03/2017   Procedure: CARDIOVERSION;  Surgeon: Sanda Klein, MD;  [facility];  Service: Cardiovascular;  Laterality: N/A;   CARDIOVERSION N/A 01/07/2018   Procedure: CARDIOVERSION;  Surgeon: Acie Fredrickson Wonda Cheng, MD;  Location: [facility] ENDOSCOPY;  Service: Cardiovascular;  Laterality: N/A;   CARDIOVERSION N/A 12/10/2019   Procedure: CARDIOVERSION;  Surgeon: Buford Dresser, MD;  Location: [facility] ENDOSCOPY;  Service: Cardiovascular;  Laterality: N/A;   CARDIOVERSION N/A 03/09/2021   Procedure: CARDIOVERSION;  Surgeon: Pixie Casino, MD;  Location: [facility];  Service: Cardiovascular;  Laterality: N/A;   CHOLECYSTECTOMY     COLONOSCOPY W/ POLYPECTOMY     Dr Earlean Shawl   CYSTOSCOPY N/A 02/06/2015    Procedure: CYSTOSCOPY;  Surgeon: Kathie Rhodes, MD;  [facility];  Service: Urology;  Laterality: N/A;   CYSTOSCOPY W/ RETROGRADES Left 11/17/2017   Procedure: CYSTOSCOPY WITH RETROGRADE /PYELOGRAM/;  Surgeon: Kathie Rhodes, MD;  [facility];  Service: Urology;  Laterality: Left;   CYSTOSCOPY WITH RETROGRADE PYELOGRAM, URETEROSCOPY AND STENT PLACEMENT Right 02/06/2015   Procedure: RETROGRADE PYELOGRAM, RIGHT URETEROSCOPY STENT PLACEMENT;  Surgeon: Kathie Rhodes, MD;  [facility];  Service: Urology;  Laterality: Right;   CYSTOSCOPY WITH RETROGRADE PYELOGRAM, URETEROSCOPY AND STENT PLACEMENT Right 03/07/2017   Procedure: CYSTOSCOPY WITH RIGHT RETROGRADE PYELOGRAM,RIGHT  URETEROSCOPYLASER LITHOTRIPSY  AND STENT PLACEMENT AND STONE BASKETRY;  Surgeon: Kathie Rhodes, MD;  Location: [facility];  Service: Urology;  Laterality: Right;   EYE SURGERY     cataract surgery   fatty mass removal  1999   pubic area   HOLMIUM LASER APPLICATION N/A 10/11/4172   Procedure: HOLMIUM LASER APPLICATION;  Surgeon: Kathie Rhodes, MD;  [facility];  Service: Urology;  Laterality: N/A;   HOLMIUM LASER APPLICATION Right 0/81/4481   Procedure: HOLMIUM LASER APPLICATION;  Surgeon: Kathie Rhodes, MD;  Location: [facility];  Service: Urology;  Laterality: Right;   HOLMIUM LASER APPLICATION Left 8/56/3149   Procedure: HOLMIUM LASER APPLICATION;  Surgeon: Kathie Rhodes, MD;  [facility];  Service: Urology;  Laterality: Left;   I & D EXTREMITY Left 12/19/2020   Procedure: IRRIGATION AND DEBRIDEMENT OF LEFT HIP HEMATOMA WITH APPLICATION OF WOUND VAC;  Surgeon: Erle Crocker, MD;  Location: [place];  Service: Orthopedics;  Laterality: Left;   I & D EXTREMITY Left 02/06/2021   Procedure: IRRIGATION AND DEBRIDEMENT DEEP ABCESS LEFT THIGH, SECONDARY CLOSURE OF WOUND DEHISCENCE;  Surgeon: Erle Crocker, MD;  Location: [place];  Service: Orthopedics;  Laterality: Left;   IR  FLUORO GUIDE PORT INSERTION RIGHT  02/24/2017   IR NEPHROSTOMY PLACEMENT RIGHT  11/17/2017   IR PATIENT EVAL TECH 0-60 MINS  03/11/2017   IR REMOVAL TUN ACCESS W/ PORT W/O FL MOD SED  04/20/2018   IR US GUIDE VASC ACCESS RIGHT  02/24/2017   KNEE ARTHROSCOPY     bilateral   LAPAROSCOPIC GASTRIC BANDING  07/10/2010   LAPAROSCOPIC GASTRIC BANDING     Laparoscopic adjustable banding APS System with posterior hiatal hernia, 2 suture.   LAPAROTOMY     for ruptured ovary and ovarian artery    NEPHROLITHOTOMY Right 11/17/2017   Procedure: NEPHROLITHOTOMY PERCUTANEOUS;  Surgeon: Kathie Rhodes, MD;  [facility];  Service: Urology;  Laterality: Right;   PACEMAKER INSERTION  03/10/2013   MDT dual chamber PPM  POCKET REVISION N/A 12/08/2013   Procedure: POCKET REVISION;  Surgeon: Deboraha Sprang, MD;  Location: [facility] CATH LAB;  Service: Cardiovascular;  Laterality: N/A;   PORTA CATH INSERTION     REVERSE SHOULDER ARTHROPLASTY Right 05/14/2018   Procedure: RIGHT REVERSE SHOULDER ARTHROPLASTY;  Surgeon: Tania Ade, MD;  Location: [place];  Service: Orthopedics;  Laterality: Right;   REVERSE SHOULDER REPLACEMENT Right 05/14/2018   RIGHT HEART CATH N/A 07/21/2019   Procedure: RIGHT HEART CATH;  Surgeon: Larey Dresser, MD;  Location: [facility] CV LAB;  Service: Cardiovascular;  Laterality: N/A;   TEE WITH CARDIOVERSION  09/22/2017   TEE WITHOUT CARDIOVERSION N/A 10/03/2017   Procedure: TRANSESOPHAGEAL ECHOCARDIOGRAM (TEE);  Surgeon: Sanda Klein, MD;  [facility];  Service: Cardiovascular;  Laterality: N/A;   TEE WITHOUT CARDIOVERSION N/A 08/04/2019   Procedure: TRANSESOPHAGEAL ECHOCARDIOGRAM (TEE);  Surgeon: Larey Dresser, MD;  Location[facility] ENDOSCOPY;  Service: Cardiovascular;  Laterality: N/A;   TEE WITHOUT CARDIOVERSION N/A 12/10/2019   Procedure: TRANSESOPHAGEAL ECHOCARDIOGRAM (TEE);  Surgeon: Buford Dresser, MD;  Location: [facility] ENDOSCOPY;  Service: Cardiovascular;  Laterality:  N/A;   TEE WITHOUT CARDIOVERSION N/A 03/09/2021   Procedure: TRANSESOPHAGEAL ECHOCARDIOGRAM (TEE);  Surgeon: Pixie Casino, MD;  Location: [place];  Service: Cardiovascular;  Laterality: N/A;   THYROIDECTOMY  1998   Dr Margot Chimes   TONSILLECTOMY     TOTAL KNEE ARTHROPLASTY  04/13/2012   Procedure: TOTAL KNEE ARTHROPLASTY;  Surgeon: Rudean Haskell, MD;  Location: [place];  Service: Orthopedics;  Laterality: Right;   VIDEO BRONCHOSCOPY WITH ENDOBRONCHIAL ULTRASOUND N/A 02/07/2017   Procedure: VIDEO BRONCHOSCOPY WITH ENDOBRONCHIAL ULTRASOUND;  Surgeon: Marshell Garfinkel, MD;  Location: [facility];  Service: Pulmonary;  Laterality: N/A;    There were no vitals filed for this visit.   Subjective Assessment - 07/18/21 0953     Subjective  Pt is a 75 y/o female who presents to OP OT due to nontraumatic subcortical hemorrhage of left cerebral hemisphere. Pt currently lives alone in a one level condo and is retired prior to onset. PMHx includes hyperlipidemia, atrial fibrillation with pacemaker on Eliquis, lymphoma, confusion. Pt will benefit from skilled occupational therapy services to address strength and endurance, ROM, balance, fine motor control, safety awareness, introduction of compensatory strategies/AE prn, energy conservation, vision, and implementation of an HEP to improve participation and safety during ADLs and IADLs.    Pertinent History h/o hyperlipidemia, atrial fibrillation with pacemaker on Eliquis, lymphoma, confusion, subcortical hemorrhage on the left with intraventricular extension    Currently in Pain? No/denies               [facility] OT Assessment - 07/18/21 1001       Assessment   Medical Diagnosis Nontraumatic subcortical hemorrhage of left cerebral hemisphere    Referring Provider (OT) Leonie Man    Onset Date/Surgical Date 02/21/21    Next MD Visit 07/24/21    Prior Therapy yes- CIR      Precautions   Precautions Fall;ICD/Pacemaker   per Dr. Leonie Man- strict BP goal 130/90; hx  CA in remission     Balance Screen   Has the patient fallen in the past 6 months Yes    How many times? 4    Has the patient had a decrease in activity level because of a fear of falling?  No    Is the patient reluctant to leave their home because of a fear of falling?  No      Home  Environment   Family/patient expects to be discharged to: Private residence    Living Arrangements Alone    Available Help at Discharge Friend(s)   comes 2 hrs 5 days a week to assist and provide transportation   Type of [place] Access Level entry    [place] One level    Writer   with 4-6" ledge   Bathroom Toilet Handicapped height    Bathroom Accessibility Yes    How accessible Accessible via walker    Adaptive equipment [place];Long-handled shoe horn;Sock aid    Home Equipment [place] - 4 wheels;Shower seat    Lives With Alone      Prior Function   Level of Independence Independent with basic ADLs    Vocation Retired    Leisure walking      ADL   ADL comments Mod I with all bathing, dressing, and bathroom transfers.  Pt does report increased fatigue post showering.  Discussed use of DME with pt stating using shower seat and leaving door cracked to increase air flow.      IADL   Meal Prep Able to complete simple warm meal prep   Mod I.  Uses a lot of prepackaged foods to cook in oven or microwave.     Written Expression   Dominant Hand Right    Handwriting 75% legible   Pt reports handwriting varies from more to less legible     Vision - History   Baseline Vision Wears glasses only for reading    Visual History Cataracts   chronic dry eye     Vision Assessment   Eye Alignment Within Functional Limits    Ocular Range of Motion Within Functional Limits    Alignment/Gaze Preference Within Defined Limits    Tracking/Visual Pursuits Able to track stimulus in all quads without difficulty    Saccades Within functional limits    Visual Fields No  apparent deficits      Activity Tolerance   Activity Tolerance Tolerate 30+ min activity without fatigue    Activity Tolerance Comments Pt reports fatigue towards end of session as having PT prior to OT      Cognition   Overall Cognitive Status Within Functional Limits for tasks assessed    Attention Selective;Alternating    Selective Attention Appears intact    Memory Appears intact    Awareness Appears intact      Sensation   Light Touch Impaired by gross assessment   c/o neuropathy in B feet     Coordination   Gross Motor Movements are Fluid and Coordinated No    Fine Motor Movements are Fluid and Coordinated No    Coordination and Movement Description mild decreased coordination LUE    9 Hole Peg Test Right;Left    Right 9 Hole Peg Test 37.4    Left 9 Hole Peg Test 40.3      ROM / Strength   AROM / PROM / Strength AROM;Strength      AROM   AROM Assessment Site Shoulder;Elbow    Right/Left Shoulder Right;Left    Right Shoulder Flexion 130 Degrees    Left Shoulder Flexion 140 Degrees    Right/Left Elbow Right;Left   WNL     Strength   Overall Strength Comments in sitting    Strength Assessment Site Shoulder;Elbow    Right/Left Shoulder Right;Left   4+/5 bilaterally     Hand Function   Right Hand Grip (  lbs) 30    Left Hand Grip (lbs) 30                              OT Education - 07/18/21 1127     Education Details energy conservation 4 P's and shoulder and elbow theraband exercises    Person(s) Educated Patient    Methods Explanation;Demonstration    Comprehension Verbalized understanding              OT Short Term Goals - 07/18/21 1136       OT SHORT TERM GOAL #1   Title Pt will verbalize understanding of 3 compensatory strategies for low vision.    Time 4    Period Weeks    Status New    Target Date 08/15/21      OT SHORT TERM GOAL #2   Title Pt will improve hand strength and endurance to improve handwriting legiblity to 90%     Time 4    Period Weeks      OT SHORT TERM GOAL #3   Title Pt will demonstrate improved activity tolerance to complete standing acitivity for 15 mins without seated rest break.    Time 4    Period Weeks      OT SHORT TERM GOAL #4   Title Pt will improve shoulder ROM to 140* in RUE to increase functional reach in to cabinets    Time 4    Period Weeks    Status New               OT Long Term Goals - 07/18/21 1139       OT LONG TERM GOAL #1   Title Pt will be independent in energy conservation strategies.    Time 8    Period Weeks    Status New    Target Date 09/12/21      OT LONG TERM GOAL #2   Title Pt will be independent in modifications for low vision.    Time 8    Period Weeks    Status New      OT LONG TERM GOAL #3   Title Pt wil be able to complete housekeeping task incorporating reaching outside BOS without reports of dizziness.    Time 8    Period Weeks    Status New      OT LONG TERM GOAL #4   Title Pt will be able to complete dual task activity without cues for sequencing and with improved safety awareness.    Time 8    Period Weeks    Status New                   Plan - 07/18/21 1155     Clinical Impression Statement Pt is a 75 y/o female who presents to OP OT due to nontraumatic subcortical hemorrhage of left cerebral hemisphere. Pt currently lives alone in a one level condo and is retired prior to onset. PMHx includes hyperlipidemia, atrial fibrillation with pacemaker on Eliquis, lymphoma, confusion. Pt will benefit from skilled occupational therapy services to address strength and endurance, ROM, balance, fine motor control, safety awareness, introduction of compensatory strategies/AE prn, energy conservation, vision, and implementation of an HEP to improve participation and safety during ADLs and IADLs.    OT Occupational Profile and History Detailed Assessment- Review of Records and additional review of physical, cognitive, psychosocial  history related to current functional performance  Occupational performance deficits (Please refer to evaluation for details): ADL's;IADL's;Leisure;Social Participation    Body Structure / Function / Physical Skills ADL;Balance;Body mechanics;Cardiopulmonary status limiting activity;Coordination;Endurance;FMC;GMC;IADL;Mobility;Pain;ROM;Proprioception;Sensation;Strength;UE functional use;Vision    Psychosocial Skills Environmental  Adaptations;Routines and Behaviors    Rehab Potential Good    Clinical Decision Making Limited treatment options, no task modification necessary    Comorbidities Affecting Occupational Performance: May have comorbidities impacting occupational performance    Modification or Assistance to Complete Evaluation  No modification of tasks or assist necessary to complete eval    OT Frequency 2x / week    OT Duration 8 weeks    OT Treatment/Interventions Self-care/ADL training;Cryotherapy;Electrical Stimulation;Ultrasound;Moist Heat;Fluidtherapy;Therapeutic exercise;Neuromuscular education;Energy conservation;Manual Therapy;Functional Mobility Training;DME and/or AE instruction;Passive range of motion;Patient/family education;Visual/perceptual remediation/compensation;Cognitive remediation/compensation;Therapeutic activities;Balance training;Psychosocial skills training;Coping strategies training    Plan Vision handout for low vision and explore accommodations.  Focus on activity tolerance and reaching outside BOS    OT Home Exercise Plan shoulder and elbow theraband    Consulted and Agree with Plan of Care Patient             Patient will benefit from skilled therapeutic intervention in order to improve the following deficits and impairments:   Body Structure / Function / Physical Skills: ADL, Balance, Body mechanics, Cardiopulmonary status limiting activity, Coordination, Endurance, FMC, GMC, IADL, Mobility, Pain, ROM, Proprioception, Sensation, Strength, UE functional  use, Vision   Psychosocial Skills: Environmental  Adaptations, Routines and Behaviors   Visit Diagnosis: Intracranial hemorrhage (HCC)  Dizziness and giddiness  Blindness, one eye, low vision other eye, unspecified eyes  Muscle weakness (generalized)  Balance problems    Problem List Patient Active Problem List   Diagnosis Date Noted   Hypertension    Pressure injury of skin 03/15/2021   Intracranial hemorrhage ([place]) 03/14/2021   Lower extremity edema    Polymicrobial bacterial infection    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    Enterococcus faecalis infection    Sepsis due to Escherichia coli ([place])    Debility 02/13/2021   Infection of wound hematoma 02/05/2021   Physical debility 12/22/2020   Tear of left hamstring 12/22/2020   Labral tear of left hip joint 12/22/2020   Acute blood loss anemia    Orthostatic hypotension 12/17/2020   History of CVA (cerebrovascular accident) 12/17/2020   Fall 12/15/2020   Hip hematoma, left, initial encounter 12/15/2020   Nondisplaced fracture of coronoid process of left ulna, initial encounter for closed fracture 12/15/2020   Multiple rib fractures 12/15/2020   COVID-19 virus infection 10/12/2020   Constipation 06/22/2020   Change in bowel habits 06/22/2020   Diverticulosis 06/22/2020   Dark stools 06/22/2020   Hemorrhoids 06/22/2020   Pacemaker 01/23/2020   Junctional rhythm 01/23/2020   Palpitations 01/23/2020   ICH (intracerebral hemorrhage) ([place]) 12/02/2019   A-fib ([place]) 12/02/2019   Anemia 12/02/2019   QT prolongation 12/02/2019   Hypothyroidism 12/02/2019   Gait abnormality 07/10/2018   S/P reverse total shoulder arthroplasty, 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   Persistent atrial fibrillation ([place])    Bilateral ureteral calculi 11/17/2017   Atrial fibrillation with RVR ([place]) 09/15/2017   Atrial flutter ([place]) 07/17/2017   Port catheter in place 04/02/2017   Non-Hodgkin lymphoma, unspecified, intrathoracic lymph nodes ([place])  02/13/2017   Mediastinal mass 02/06/2017   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/2017   Abnormal chest x-ray    Lung mass 02/03/2017   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   OSA (obstructive sleep apnea) 02/03/2015   History of renal calculi  11/16/2014   Renal calculi 11/16/2014   Family history of colon cancer 10/26/2014   Mechanical complication due to cardiac pacemaker pulse generator 11/06/2013   Dyspnea 05/12/2013   Hypothyroidism 04/21/2013   Pleural effusion 04/19/2013   Long term (current) use of anticoagulants 12/20/2010   EPIDERMOID CYST 08/22/2010   Chronic diastolic heart failure ([place]) 08/06/2010   SYNCOPE AND COLLAPSE 07/27/2010   OSTEOARTHRITIS, KNEE, RIGHT 03/15/2010   Class 1 obesity due to excess calories with body mass index (BMI) of 33.0 to 33.9 in adult 12/07/2009   NONSPEC ELEVATION OF LEVELS OF TRANSAMINASE/LDH 09/08/2009   BREAST CANCER, HX OF 07/25/2009   COLONIC POLYPS, HX OF 07/25/2009   TUBULOVILLOUS ADENOMA, COLON 04/29/2008   HYPERGLYCEMIA, FASTING 04/29/2008   HYPERLIPIDEMIA 06/22/2007   CARCINOMA, THYROID GLAND, HX OF 06/22/2007    Simonne Come, OT/L 07/18/2021, 11:56 AM  [facility] [address] [place], [place], [address] Phone: [phone]   Fax:  [phone]  Name: Kayla Harrison MRN: [id number] Date of Birth: 04/20/1946

## 2021-07-18 NOTE — Therapy (Signed)
[facility] [address], [place], [place], [address] Phone: [phone]   Fax:  [phone]  Physical Therapy Evaluation  Patient Details  Name: Kayla Harrison MRN: [id number] Date of Birth: October 15, 1945 Referring Provider (PT): Garvin Fila, MD   Encounter Date: 07/18/2021   PT End of Session - 07/18/21 1024     Visit Number 1    Number of Visits 17    Date for PT Re-Evaluation 09/12/21    Authorization Type Medicare & MoO    PT Start Time 0923    PT Stop Time 1013    PT Time Calculation (min) 50 min    Activity Tolerance Patient tolerated treatment well    Behavior During Therapy [facility] for tasks assessed/performed             Past Medical History:  Diagnosis Date   Anemia    Arthritis    osteoarthritis - knees and right shoulder   Blood transfusion without reported diagnosis    Breast cancer ([place])    Dr Margot Chimes, total thyroidectomy- 1999- for cancer   Brucellosis 1964   Chronic bilateral pleural effusions    Colon polyp    Dr Earlean Shawl   Complication of anesthesia    Ketamine produces LSD reaction, bright colored nightmarish experience    Dyslipidemia    Endometriosis    Fibroids    H/O pleural effusion    s/p thoracentesis w 3279ml withdrawn   Hepatitis    [place], ?hepatitis    History of dysphagia    due to radiation therapy   History of hiatal hernia    small noted on PET scan   Hypertension    Hypothyroidism    Lung cancer, lower lobe ([place]) 01/2017   radiation RX completed 03/04/17; will start chemo 6/27, pt unaware of lung cancer   Morbid obesity ([place])    Status post lap band surgery   Nephrolithiasis    Non Hodgkin's lymphoma ([place])    on chemotherapy   Persistent atrial fibrillation ([place])    a. s/p PVI 2008 b. s/p convergent ablation 1975 complicated by bradycardia requiring pacemaker implant   Personal history of radiation therapy    Presence of permanent  cardiac pacemaker    Rotator cuff tear    Right   Stroke ([place])    2003- [place] x2   SVC syndrome    with lung mass and non hodgkins lymphoma   Thyroid cancer ([place]) 2000    Past Surgical History:  Procedure Laterality Date   ABDOMINAL HYSTERECTOMY  1983   afib ablation     a. 2008 PVI b. 2014 convergent ablation   APPENDECTOMY     BONE MARROW BIOPSY  02/21/2017   BREAST LUMPECTOMY Left 2010   [place]     2015- negative   CARDIOVERSION  10/09/2012   Procedure: CARDIOVERSION;  Surgeon: Minus Breeding, MD;  Location: [place];  Service: Cardiovascular;  Laterality: N/A;   CARDIOVERSION  10/09/2012   Procedure: CARDIOVERSION;  Surgeon: Minus Breeding, MD;  Location: [facility] ENDOSCOPY;  Service: Cardiovascular;  Laterality: N/A;  [name] gave the ok to add pt to the add on , but we must check to find out if the can add pt on at [phone] ( [phone])   CARDIOVERSION N/A 11/20/2012   Procedure: CARDIOVERSION;  Surgeon: Fay Records, MD;  Location: [place];  Service: Cardiovascular;  Laterality:  N/A;   CARDIOVERSION N/A 07/18/2017   Procedure: CARDIOVERSION;  Surgeon: Acie Fredrickson Wonda Cheng, MD;  Location: [place];  Service: Cardiovascular;  Laterality: N/A;   CARDIOVERSION N/A 10/03/2017   Procedure: CARDIOVERSION;  Surgeon: Sanda Klein, MD;  Location: [facility] ENDOSCOPY;  Service: Cardiovascular;  Laterality: N/A;   CARDIOVERSION N/A 01/07/2018   Procedure: CARDIOVERSION;  Surgeon: Acie Fredrickson Wonda Cheng, MD;  Location: [facility] ENDOSCOPY;  Service: Cardiovascular;  Laterality: N/A;   CARDIOVERSION N/A 12/10/2019   Procedure: CARDIOVERSION;  Surgeon: Buford Dresser, MD;  Location: [facility] ENDOSCOPY;  Service: Cardiovascular;  Laterality: N/A;   CARDIOVERSION N/A 03/09/2021   Procedure: CARDIOVERSION;  Surgeon: Pixie Casino, MD;  Location: [place];  Service: Cardiovascular;  Laterality: N/A;   CHOLECYSTECTOMY     COLONOSCOPY W/ POLYPECTOMY     Dr Earlean Shawl   CYSTOSCOPY N/A  02/06/2015   Procedure: CYSTOSCOPY;  Surgeon: Kathie Rhodes, MD;  Location: [facility] OR[facility];  Service: Urology;  Laterality: N/A;   CYSTOSCOPY W/ RETROGRADES Left 11/17/2017   Procedure: CYSTOSCOPY WITH RETROGRADE /PYELOGRAM/;  Surgeon: Kathie Rhodes, MD;  Location: [facility];  Service: Urology;  Laterality: Left;   CYSTOSCOPY WITH RETROGRADE PYELOGRAM, URETEROSCOPY AND STENT PLACEMENT Right 02/06/2015   Procedure: RETROGRADE PYELOGRAM, RIGHT URETEROSCOPY STENT PLACEMENT;  Surgeon: Kathie Rhodes, MD;  Location: [facility];  Service: Urology;  Laterality: Right;   CYSTOSCOPY WITH RETROGRADE PYELOGRAM, URETEROSCOPY AND STENT PLACEMENT Right 03/07/2017   Procedure: CYSTOSCOPY WITH RIGHT RETROGRADE PYELOGRAM,RIGHT  URETEROSCOPYLASER LITHOTRIPSY  AND STENT PLACEMENT AND STONE BASKETRY;  Surgeon: Kathie Rhodes, MD;  Location: [place];  Service: Urology;  Laterality: Right;   EYE SURGERY     cataract surgery   fatty mass removal  1999   pubic area   HOLMIUM LASER APPLICATION N/A 01/29/3266   Procedure: HOLMIUM LASER APPLICATION;  Surgeon: Kathie Rhodes, MD;  Location: [facility];  Service: Urology;  Laterality: N/A;   HOLMIUM LASER APPLICATION Right 10/17/5807   Procedure: HOLMIUM LASER APPLICATION;  Surgeon: Kathie Rhodes, MD;  Location: [facility];  Service: Urology;  Laterality: Right;   HOLMIUM LASER APPLICATION Left 9/83/3825   Procedure: HOLMIUM LASER APPLICATION;  Surgeon: Kathie Rhodes, MD;  Location: [facility];  Service: Urology;  Laterality: Left;   I & D EXTREMITY Left 12/19/2020   Procedure: IRRIGATION AND DEBRIDEMENT OF LEFT HIP HEMATOMA WITH APPLICATION OF WOUND VAC;  Surgeon: Erle Crocker, MD;  Location: [place];  Service: Orthopedics;  Laterality: Left;   I & D EXTREMITY Left 02/06/2021   Procedure: IRRIGATION AND DEBRIDEMENT DEEP ABCESS LEFT THIGH, SECONDARY CLOSURE OF WOUND DEHISCENCE;  Surgeon: Erle Crocker, MD;  Location: [place];  Service: Orthopedics;  Laterality:  Left;   IR FLUORO GUIDE PORT INSERTION RIGHT  02/24/2017   IR NEPHROSTOMY PLACEMENT RIGHT  11/17/2017   IR PATIENT EVAL TECH 0-60 MINS  03/11/2017   IR REMOVAL TUN ACCESS W/ PORT W/O FL MOD SED  04/20/2018   IR US GUIDE VASC ACCESS RIGHT  02/24/2017   KNEE ARTHROSCOPY     bilateral   LAPAROSCOPIC GASTRIC BANDING  07/10/2010   LAPAROSCOPIC GASTRIC BANDING     Laparoscopic adjustable banding APS System with posterior hiatal hernia, 2 suture.   LAPAROTOMY     for ruptured ovary and ovarian artery    NEPHROLITHOTOMY Right 11/17/2017   Procedure: NEPHROLITHOTOMY PERCUTANEOUS;  Surgeon: Kathie Rhodes, MD;  Location: [facility];  Service: Urology;  Laterality: Right;   PACEMAKER INSERTION  03/10/2013   MDT dual  chamber PPM   POCKET REVISION N/A 12/08/2013   Procedure: POCKET REVISION;  Surgeon: Deboraha Sprang, MD;  Location: [facility] CATH LAB;  Service: Cardiovascular;  Laterality: N/A;   PORTA CATH INSERTION     REVERSE SHOULDER ARTHROPLASTY Right 05/14/2018   Procedure: RIGHT REVERSE SHOULDER ARTHROPLASTY;  Surgeon: Tania Ade, MD;  Location: [place];  Service: Orthopedics;  Laterality: Right;   REVERSE SHOULDER REPLACEMENT Right 05/14/2018   RIGHT HEART CATH N/A 07/21/2019   Procedure: RIGHT HEART CATH;  Surgeon: Larey Dresser, MD;  Location: [place] CV LAB;  Service: Cardiovascular;  Laterality: N/A;   TEE WITH CARDIOVERSION  09/22/2017   TEE WITHOUT CARDIOVERSION N/A 10/03/2017   Procedure: TRANSESOPHAGEAL ECHOCARDIOGRAM (TEE);  Surgeon: Sanda Klein, MD;  Location: [facility] ENDOSCOPY;  Service: Cardiovascular;  Laterality: N/A;   TEE WITHOUT CARDIOVERSION N/A 08/04/2019   Procedure: TRANSESOPHAGEAL ECHOCARDIOGRAM (TEE);  Surgeon: Larey Dresser, MD;  Location: [facility] ENDOSCOPY;  Service: Cardiovascular;  Laterality: N/A;   TEE WITHOUT CARDIOVERSION N/A 12/10/2019   Procedure: TRANSESOPHAGEAL ECHOCARDIOGRAM (TEE);  Surgeon: Buford Dresser, MD;  Location: [facility] ENDOSCOPY;  Service: Cardiovascular;   Laterality: N/A;   TEE WITHOUT CARDIOVERSION N/A 03/09/2021   Procedure: TRANSESOPHAGEAL ECHOCARDIOGRAM (TEE);  Surgeon: Pixie Casino, MD;  Location: [place];  Service: Cardiovascular;  Laterality: N/A;   THYROIDECTOMY  1998   Dr Margot Chimes   TONSILLECTOMY     TOTAL KNEE ARTHROPLASTY  04/13/2012   Procedure: TOTAL KNEE ARTHROPLASTY;  Surgeon: Rudean Haskell, MD;  Location: [place];  Service: Orthopedics;  Laterality: Right;   VIDEO BRONCHOSCOPY WITH ENDOBRONCHIAL ULTRASOUND N/A 02/07/2017   Procedure: VIDEO BRONCHOSCOPY WITH ENDOBRONCHIAL ULTRASOUND;  Surgeon: Marshell Garfinkel, MD;  Location: [place];  Service: Pulmonary;  Laterality: N/A;    Vitals:   07/18/21 0932  BP: (!) 130/100      Subjective Assessment - 07/18/21 0924     Subjective Patient reports experiencing a fall in June resulting in a brain bleed. Also reports 2 falls in March, with the last resulting in a L thigh wound. Reports that she stayed at the hospital until August 2nd when she was [facility] home. Family members want her to have around the clock supervision, however she has someone to help her with transportation and other tasks she may need every other day. Also has a cleaning service every other week. Otherwise I with ADLs. Using [phone] inside/outside but is starting to walk on her own without thinking about it. Owns a SPC but does not use it. Patient notes trouble getting off the floor after her falls, and would like to work towards completing this task independently.    Pertinent History anemia, breast CA s/p surgery, lung CA s/p radiation and chemo, lymphoma on chemo, thyroid CA, HLD, hiatal hernia, HTN, a-fib, pacemaker, R reverse TSA 2019, stroke 2003, SVC syndrome, R TKA 2013    Limitations Lifting;Standing;Walking;House hold activities    How long can you stand comfortably? 2.5 hours out of the house    How long can you walk comfortably? 2.5 hours out of the house    Diagnostic tests 03/15/21 head CT: Similar appearance  of left caudothalamic hemorrhage with  intraventricular extension and mild adjacent edema. No new  hemorrhage. Unchanged mild trapping of the left lateral ventricle    Patient Stated Goals be able to get up off the floor    Currently in Pain? No/denies                [facility]  PT Assessment - 07/18/21 0935       Assessment   Medical Diagnosis Nontraumatic subcortical hemorrhage of left cerebral hemisphere    Referring Provider (PT) Garvin Fila, MD    Onset Date/Surgical Date 02/21/21    Next MD Visit 12/27/21    Prior Therapy yes- inpatient rehab      Precautions   Precautions Fall;ICD/Pacemaker   per Dr. Leonie Man- strict BP goal 130/90; hx CA in remission     Balance Screen   Has the patient fallen in the past 6 months Yes    How many times? 4    Has the patient had a decrease in activity level because of a fear of falling?  Yes    Is the patient reluctant to leave their home because of a fear of falling?  No      Home Environment   Living Environment Private residence    Living Arrangements Alone   has aid come to help with transportation every other day   Available Help at Discharge Friend(s)    Type of [place] Level entry    [place] One level    [place] - 4 wheels;Cane - single point;Shower seat;Wheelchair - manual;Grab bars - toilet;Grab bars - tub/shower;Toilet riser      Prior Function   Level of Independence Independent with basic ADLs    Vocation Retired    Leisure walking, driving      Cognition   Overall Cognitive Status Within Functional Limits for tasks assessed      Sensation   Light Touch Impaired by gross assessment   c/o neuropathy in B feet     Coordination   Gross Motor Movements are Fluid and Coordinated No   slow     Posture/Postural Control   Posture/Postural Control Postural limitations    Postural Limitations Rounded Shoulders;Forward head;Increased thoracic kyphosis;Flexed trunk      ROM / Strength    AROM / PROM / Strength AROM;Strength      AROM   AROM Assessment Site Hip;Knee;Ankle    Right/Left Hip Right;Left    Right/Left Knee Right;Left    Right/Left Ankle Right;Left    Right Ankle Dorsiflexion 6    Left Ankle Dorsiflexion 11      Strength   Overall Strength Comments in sitting    Strength Assessment Site Hip;Knee;Ankle    Right/Left Hip Right;Left    Right Hip Flexion 4-/5    Right Hip ABduction 4+/5    Right Hip ADduction 4/5    Left Hip Flexion 4-/5    Left Hip ABduction 4+/5    Left Hip ADduction 4/5    Right/Left Knee Right;Left    Right Knee Flexion 4/5    Right Knee Extension 4+/5    Left Knee Flexion 4/5    Left Knee Extension 4+/5    Right/Left Ankle Right;Left    Right Ankle Dorsiflexion 4+/5    Right Ankle Plantar Flexion 4/5    Left Ankle Dorsiflexion 4/5    Left Ankle Plantar Flexion 4/5      Palpation   Palpation comment no TTP      Ambulation/Gait   Assistive device 4-wheeled walker;None    Gait Pattern Step-to pattern;Decreased step length - left;Decreased step length - right;Right foot flat;Left foot flat;Right flexed knee in stance;Left flexed knee in stance;Poor foot clearance - left;Poor foot clearance - right;Trunk flexed    Ambulation Surface Level;Indoor    Gait  velocity decreased      Standardized Balance Assessment   Standardized Balance Assessment Timed Up and Go Test;Five Times Sit to Stand    Five times sit to stand comments  17.93   with 1 UE support; unable to stand fully     Timed Up and Go Test   Normal TUG (seconds) 18.44   with 4WW                       Objective measurements completed on examination: See above findings.                PT Education - 07/18/21 1023     Education Details prognosis, POC, HEP to be performed at counter top for safety- Access Code: [email or web address]; advised to bring in [facility] next session but to continue using 4WW    Person(s) Educated Patient    Methods  Explanation;Demonstration;Tactile cues;Verbal cues;Handout    Comprehension Verbalized understanding;Returned demonstration;Need further instruction              PT Short Term Goals - 07/18/21 1031       PT SHORT TERM GOAL #1   Title Patient to be independent with initial HEP.    Time 3    Period Weeks    Status New    Target Date 08/08/21               PT Long Term Goals - 07/18/21 1032       PT LONG TERM GOAL #1   Title Patient to be independent with advanced HEP.    Time 8    Period Weeks    Status New    Target Date 09/12/21      PT LONG TERM GOAL #2   Title Patient to demonstrate B LE strength >/=4+/5.    Time 8    Period Weeks    Status New    Target Date 09/12/21      PT LONG TERM GOAL #3   Title Patient to score at least 46/56 on Berg in order to decrease risk of falls.    Time 8    Period Weeks    Status New    Target Date 09/12/21      PT LONG TERM GOAL #4   Title Patient to complete TUG in <14 sec with LRAD in order to decrease risk of falls.    Time 8    Period Weeks    Status New    Target Date 09/12/21      PT LONG TERM GOAL #5   Title Patient to demonstrate 5xSTS test in <15 sec in order to decrease risk of falls.    Time 8    Period Weeks    Status New    Target Date 09/12/21      Additional Long Term Goals   Additional Long Term Goals Yes      PT LONG TERM GOAL #6   Title Patient to demonstrate safe floor transfer with mod I.    Time 8    Period Weeks    Status New    Target Date 09/12/21                    Plan - 07/18/21 1024     Clinical Impression Statement Patient is a 75 y/o F presenting to OPPT with c/o imbalance and limited activity tolerance for the past several months. PMH significant for inpatient rehab stay 03/19/21-04/03/21  s/p fall resulting in L subcortical hemorrhage with intraventricular extension. Patient admits to several falls in the past 6 months and notes difficulty with floor transfers  after falling. Currently ambulating with 4WW indoors/outdoors but reports starting to ambulate without a device at times. Per MD-currently kept on strict BP goal below 130/90. Today diastolic BP elevated; will continue to monitor and notify MD as needed. Patient today presenting with rounded and kyphotic posture, limited B ankle dorsiflexion AROM, decreased B LE strength, decreased gait speed, and gait deviations. Patient's scores on 5xSTS and TUG indicate an increased risk of falls. Patient was educated on gentle stretching and strengthening HEP to be performed at counter top for safety. Patient reported understanding. Would benefit from skilled PT services 2x/week for 8 weeks to address aforementioned impairments in order to optimize level of function.    Personal Factors and Comorbidities Age;Fitness;Comorbidity 3+;Transportation;Time since onset of injury/illness/exacerbation;Past/Current Experience    Comorbidities anemia, breast CA s/p surgery, lung CA s/p radiation and chemo, lymphoma on chemo, thyroid CA, HLD, hiatal hernia, HTN, a-fib, pacemaker, R reverse TSA 2019, stroke 2003, SVC syndrome, R TKA 2013    Examination-Activity Limitations Bathing;Locomotion Level;Transfers;Bed Mobility;Reach Overhead;Bend;Carry;Squat;Dressing;Stairs;Stand;Hygiene/Grooming;Lift;Toileting    Examination-Participation Restrictions Laundry;Shop;Community Activity;Cleaning;Church;Meal Prep    Stability/Clinical Decision Making Stable/Uncomplicated    Clinical Decision Making Low    Rehab Potential Good    PT Frequency 2x / week    PT Duration 8 weeks    PT Treatment/Interventions ADLs/Self Care Home Management;Cryotherapy;Electrical Stimulation;DME Instruction;Moist Heat;Gait training;Stair training;Functional mobility training;Therapeutic activities;Therapeutic exercise;Balance training;Neuromuscular re-education;Manual techniques;Patient/family education;Passive range of motion;Dry needling;Energy  conservation;Vestibular;Vasopneumatic Device;Taping    PT Next Visit Plan reassess HEP; assess Berg; progress LE stretching and strengthening to improve posture and gait deviations; trial gait training with SPC    Consulted and Agree with Plan of Care Patient             Patient will benefit from skilled therapeutic intervention in order to improve the following deficits and impairments:  Abnormal gait, Decreased range of motion, Difficulty walking, Increased fascial restricitons, Increased muscle spasms, Decreased safety awareness, Decreased endurance, Cardiopulmonary status limiting activity, Decreased activity tolerance, Decreased balance, Impaired flexibility, Improper body mechanics, Postural dysfunction, Decreased strength  Visit Diagnosis: Unsteadiness on feet  Repeated falls  Muscle weakness (generalized)  Other abnormalities of gait and mobility     Problem List Patient Active Problem List   Diagnosis Date Noted   Hypertension    Pressure injury of skin 03/15/2021   Intracranial hemorrhage (HCC) 03/14/2021   Lower extremity edema    Polymicrobial bacterial infection    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    Enterococcus faecalis infection    Sepsis due to Escherichia coli ([place])    Debility 02/13/2021   Infection of wound hematoma 02/05/2021   Physical debility 12/22/2020   Tear of left hamstring 12/22/2020   Labral tear of left hip joint 12/22/2020   Acute blood loss anemia    Orthostatic hypotension 12/17/2020   History of CVA (cerebrovascular accident) 12/17/2020   Fall 12/15/2020   Hip hematoma, left, initial encounter 12/15/2020   Nondisplaced fracture of coronoid process of left ulna, initial encounter for closed fracture 12/15/2020   Multiple rib fractures 12/15/2020   COVID-19 virus infection 10/12/2020   Constipation 06/22/2020   Change in bowel habits 06/22/2020   Diverticulosis 06/22/2020   Dark stools 06/22/2020   Hemorrhoids 06/22/2020    Pacemaker 01/23/2020   Junctional rhythm 01/23/2020   Palpitations 01/23/2020   ICH (intracerebral hemorrhage) (  [place]) 12/02/2019   A-fib ([place]) 12/02/2019   Anemia 12/02/2019   QT prolongation 12/02/2019   Hypothyroidism 12/02/2019   Gait abnormality 07/10/2018   S/P reverse total shoulder arthroplasty, 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   Persistent atrial fibrillation ([place])    Bilateral ureteral calculi 11/17/2017   Atrial fibrillation with RVR ([place]) 09/15/2017   Atrial flutter ([place]) 07/17/2017   Port catheter in place 04/02/2017   Non-Hodgkin lymphoma, unspecified, intrathoracic lymph nodes ([place]) 02/13/2017   Mediastinal mass 02/06/2017   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   Abnormal chest x-ray    Lung mass 02/03/2017   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   OSA (obstructive sleep apnea) 02/03/2015   History of renal calculi 11/16/2014   Renal calculi 11/16/2014   Family history of colon cancer 10/26/2014   Mechanical complication due to cardiac pacemaker pulse generator 11/06/2013   Dyspnea 05/12/2013   Hypothyroidism 04/21/2013   Pleural effusion 04/19/2013   Long term (current) use of anticoagulants 12/20/2010   EPIDERMOID CYST 08/22/2010   Chronic diastolic heart failure ([place]) 08/06/2010   SYNCOPE AND COLLAPSE 07/27/2010   OSTEOARTHRITIS, KNEE, RIGHT 03/15/2010   Class 1 obesity due to excess calories with body mass index (BMI) of 33.0 to 33.9 in adult 12/07/2009   NONSPEC ELEVATION OF LEVELS OF TRANSAMINASE/LDH 09/08/2009   BREAST CANCER, HX OF 07/25/2009   COLONIC POLYPS, HX OF 07/25/2009   TUBULOVILLOUS ADENOMA, COLON 04/29/2008   HYPERGLYCEMIA, FASTING 04/29/2008   HYPERLIPIDEMIA 06/22/2007   CARCINOMA, THYROID GLAND, HX OF 06/22/2007     Janene Harvey, PT, DPT 07/18/21 10:37 AM   [facility] [address] [place], [place], [address] Phone: [phone]   Fax:  [phone]  Name: ILDA LASKIN MRN: [id number] Date of Birth: 1945-11-02

## 2021-07-19 NOTE — Addendum Note (Signed)
Addended by: Kerrie Buffalo on: 07/19/2021 02:43 PM   Modules accepted: Orders

## 2021-07-19 NOTE — Therapy (Addendum)
[facility] [place], [place], [place], [address] Phone: [phone]   Fax:  [phone]  Occupational Therapy Evaluation  Patient Details  Name: Kayla Harrison MRN: [id number] Date of Birth: 01-30-1946 Referring Provider (OT): Leonie Man   Encounter Date: 07/18/2021   OT End of Session - 07/18/21 1128     Visit Number 1    Number of Visits 17    Date for OT Re-Evaluation 09/12/21    Authorization Type Medicare A and B    OT Start Time 1749    OT Stop Time 1100    OT Time Calculation (min) 45 min    Activity Tolerance Patient tolerated treatment well    Behavior During Therapy WFL for tasks assessed/performed             Past Medical History:  Diagnosis Date   Anemia    Arthritis    osteoarthritis - knees and right shoulder   Blood transfusion without reported diagnosis    Breast cancer ([place])    Dr Margot Chimes, total thyroidectomy- 1999- for cancer   Brucellosis 1964   Chronic bilateral pleural effusions    Colon polyp    Dr Earlean Shawl   Complication of anesthesia    Ketamine produces LSD reaction, bright colored nightmarish experience    Dyslipidemia    Endometriosis    Fibroids    H/O pleural effusion    s/p thoracentesis w 3235m withdrawn   Hepatitis    [place] ?hepatitis    History of dysphagia    due to radiation therapy   History of hiatal hernia    small noted on PET scan   Hypertension    Hypothyroidism    Lung cancer, lower lobe ([place] 01/2017   radiation RX completed 03/04/17; will start chemo 6/27, pt unaware of lung cancer   Morbid obesity ([place]    Status post lap band surgery   Nephrolithiasis    Non Hodgkin's lymphoma ([place]    on chemotherapy   Persistent atrial fibrillation ([place]    a. s/p PVI 2008 b. s/p convergent ablation 24496complicated by bradycardia requiring pacemaker implant   Personal history of radiation therapy    Presence of permanent cardiac  pacemaker    Rotator cuff tear    Right   Stroke ([place]    [place]   SVC syndrome    with lung mass and non hodgkins lymphoma   Thyroid cancer ([place] 2000    Past Surgical History:  Procedure Laterality Date   ABDOMINAL HYSTERECTOMY  1983   afib ablation     a. 2008 PVI b. 2014 convergent ablation   APPENDECTOMY     BONE MARROW BIOPSY  02/21/2017   BREAST LUMPECTOMY Left 2010   [place]    2015- negative   CARDIOVERSION  10/09/2012   Procedure: CARDIOVERSION;  Surgeon: JMinus Breeding MD;  [place] [facility]  Service: Cardiovascular;  Laterality: N/A;   CARDIOVERSION  10/09/2012   Procedure: CARDIOVERSION;  Surgeon: JMinus Breeding MD;  [facility]  Service: Cardiovascular;  Laterality: N/A;  [name] the ok to add pt to the add on , but we must check to find out if the can add pt on at 1400 [phone]   CARDIOVERSION N/A 11/20/2012   Procedure: CARDIOVERSION;  Surgeon: PFay Records MD;  [facility]  Service: Cardiovascular;  Laterality: N/A;  CARDIOVERSION N/A 07/18/2017   Procedure: CARDIOVERSION;  Surgeon: Acie Fredrickson, Wonda Cheng, MD;  Location: [place];  Service: Cardiovascular;  Laterality: N/A;   CARDIOVERSION N/A 10/03/2017   Procedure: CARDIOVERSION;  Surgeon: Sanda Klein, MD;  [facility];  Service: Cardiovascular;  Laterality: N/A;   CARDIOVERSION N/A 01/07/2018   Procedure: CARDIOVERSION;  Surgeon: Acie Fredrickson Wonda Cheng, MD;  Location[facility] END[facility]COPY;  Service: Cardiovascular;  Laterality: N/A;   CARDIOVERSION N/A 12/10/2019   Procedure: CARDIOVERSION;  Surgeon: Buford Dresser, MD;  Location: [facility] ENDOSCOPY;  Service: Cardiovascular;  Laterality: N/A;   CARDIOVERSION N/A 03/09/2021   Procedure: CARDIOVERSION;  Surgeon: Pixie Casino, MD;  Location: [place];  Service: Cardiovascular;  Laterality: N/A;   CHOLECYSTECTOMY     COLONOSCOPY W/ POLYPECTOMY     Dr Earlean Shawl   CYSTOSCOPY N/A 02/06/2015    Procedure: CYSTOSCOPY;  Surgeon: Kathie Rhodes, MD;  [facility];  Service: Urology;  Laterality: N/A;   CYSTOSCOPY W/ RETROGRADES Left 11/17/2017   Procedure: CYSTOSCOPY WITH RETROGRADE /PYELOGRAM/;  Surgeon: Kathie Rhodes, MD;  [facility];  Service: Urology;  Laterality: Left;   CYSTOSCOPY WITH RETROGRADE PYELOGRAM, URETEROSCOPY AND STENT PLACEMENT Right 02/06/2015   Procedure: RETROGRADE PYELOGRAM, RIGHT URETEROSCOPY STENT PLACEMENT;  Surgeon: Kathie Rhodes, MD;  [facility];  Service: Urology;  Laterality: Right;   CYSTOSCOPY WITH RETROGRADE PYELOGRAM, URETEROSCOPY AND STENT PLACEMENT Right 03/07/2017   Procedure: CYSTOSCOPY WITH RIGHT RETROGRADE PYELOGRAM,RIGHT  URETEROSCOPYLASER LITHOTRIPSY  AND STENT PLACEMENT AND STONE BASKETRY;  Surgeon: Kathie Rhodes, MD;  Location: [place];  Service: Urology;  Laterality: Right;   EYE SURGERY     cataract surgery   fatty mass removal  1999   pubic area   HOLMIUM LASER APPLICATION N/A 7/89/3810   Procedure: HOLMIUM LASER APPLICATION;  Surgeon: Kathie Rhodes, MD;  [facility];  Service: Urology;  Laterality: N/A;   HOLMIUM LASER APPLICATION Right 1/75/1025   Procedure: HOLMIUM LASER APPLICATION;  Surgeon: Kathie Rhodes, MD;  Location: [facility];  Service: Urology;  Laterality: Right;   HOLMIUM LASER APPLICATION Left 8/52/7782   Procedure: HOLMIUM LASER APPLICATION;  Surgeon: Kathie Rhodes, MD;  [facility];  Service: Urology;  Laterality: Left;   I & D EXTREMITY Left 12/19/2020   Procedure: IRRIGATION AND DEBRIDEMENT OF LEFT HIP HEMATOMA WITH APPLICATION OF WOUND VAC;  Surgeon: Erle Crocker, MD;  Location: [place];  Service: Orthopedics;  Laterality: Left;   I & D EXTREMITY Left 02/06/2021   Procedure: IRRIGATION AND DEBRIDEMENT DEEP ABCESS LEFT THIGH, SECONDARY CLOSURE OF WOUND DEHISCENCE;  Surgeon: Erle Crocker, MD;  Location: [place];  Service: Orthopedics;  Laterality: Left;   IR  FLUORO GUIDE PORT INSERTION RIGHT  02/24/2017   IR NEPHROSTOMY PLACEMENT RIGHT  11/17/2017   IR PATIENT EVAL TECH 0-60 MINS  03/11/2017   IR REMOVAL TUN ACCESS W/ PORT W/O FL MOD SED  04/20/2018   IR US GUIDE VASC ACCESS RIGHT  02/24/2017   KNEE ARTHROSCOPY     bilateral   LAPAROSCOPIC GASTRIC BANDING  07/10/2010   LAPAROSCOPIC GASTRIC BANDING     Laparoscopic adjustable banding APS System with posterior hiatal hernia, 2 suture.   LAPAROTOMY     for ruptured ovary and ovarian artery    NEPHROLITHOTOMY Right 11/17/2017   Procedure: NEPHROLITHOTOMY PERCUTANEOUS;  Surgeon: Kathie Rhodes, MD;  [facility];  Service: Urology;  Laterality: Right;   PACEMAKER INSERTION  03/10/2013   MDT dual chamber PPM  POCKET REVISION N/A 12/08/2013   Procedure: POCKET REVISION;  Surgeon: Deboraha Sprang, MD;  Location: [facility] C[facility] LAB;  Service: Cardiovascular;  Laterality: N/A;   PORTA CATH INSERTION     REVERSE SHOULDER ARTHROPLASTY Right 05/14/2018   Procedure: RIGHT REVERSE SHOULDER ARTHROPLASTY;  Surgeon: Tania Ade, MD;  Location: [place];  Service: Orthopedics;  Laterality: Right;   REVERSE SHOULDER REPLACEMENT Right 05/14/2018   RIGHT HEART CATH N/A 07/21/2019   Procedure: RIGHT HEART CATH;  Surgeon: Larey Dresser, MD;  Location: [place] CV LAB;  Service: Cardiovascular;  Laterality: N/A;   TEE WITH CARDIOVERSION  09/22/2017   TEE WITHOUT CARDIOVERSION N/A 10/03/2017   Procedure: TRANSESOPHAGEAL ECHOCARDIOGRAM (TEE);  Surgeon: Sanda Klein, MD;  [facility];  Service: Cardiovascular;  Laterality: N/A;   TEE WITHOUT CARDIOVERSION N/A 08/04/2019   Procedure: TRANSESOPHAGEAL ECHOCARDIOGRAM (TEE);  Surgeon: Larey Dresser, MD;  Location[facility] ENDOSCOPY;  Service: Cardiovascular;  Laterality: N/A;   TEE WITHOUT CARDIOVERSION N/A 12/10/2019   Procedure: TRANSESOPHAGEAL ECHOCARDIOGRAM (TEE);  Surgeon: Buford Dresser, MD;  Location: [facility] ENDOSCOPY;  Service: Cardiovascular;  Laterality:  N/A;   TEE WITHOUT CARDIOVERSION N/A 03/09/2021   Procedure: TRANSESOPHAGEAL ECHOCARDIOGRAM (TEE);  Surgeon: Pixie Casino, MD;  Location: [place];  Service: Cardiovascular;  Laterality: N/A;   THYROIDECTOMY  1998   Dr Margot Chimes   TONSILLECTOMY     TOTAL KNEE ARTHROPLASTY  04/13/2012   Procedure: TOTAL KNEE ARTHROPLASTY;  Surgeon: Rudean Haskell, MD;  Location: [place];  Service: Orthopedics;  Laterality: Right;   VIDEO BRONCHOSCOPY WITH ENDOBRONCHIAL ULTRASOUND N/A 02/07/2017   Procedure: VIDEO BRONCHOSCOPY WITH ENDOBRONCHIAL ULTRASOUND;  Surgeon: Marshell Garfinkel, MD;  Location: [place];  Service: Pulmonary;  Laterality: N/A;    There were no vitals filed for this visit.   Subjective Assessment - 07/18/21 0953     Subjective  Pt is a 75 y/o female who presents to OP OT due to nontraumatic subcortical hemorrhage of left cerebral hemisphere. Pt currently lives alone in a one level condo and is retired prior to onset. PMHx includes hyperlipidemia, atrial fibrillation with pacemaker on Eliquis, lymphoma, confusion. Pt will benefit from skilled occupational therapy services to address strength and endurance, ROM, balance, fine motor control, safety awareness, introduction of compensatory strategies/AE prn, energy conservation, vision, and implementation of an HEP to improve participation and safety during ADLs and IADLs.    Pertinent History h/o hyperlipidemia, atrial fibrillation with pacemaker on Eliquis, lymphoma, confusion, subcortical hemorrhage on the left with intraventricular extension    Currently in Pain? No/denies               [facility] OT Assessment - 07/18/21 1001       Assessment   Medical Diagnosis Nontraumatic subcortical hemorrhage of left cerebral hemisphere    Referring Provider (OT) Leonie Man    Onset Date/Surgical Date 02/21/21    Next MD Visit 07/24/21    Prior Therapy yes- CIR      Precautions   Precautions Fall;ICD/Pacemaker   per Dr. Leonie Man- strict BP goal 130/90; hx  CA in remission     Balance Screen   Has the patient fallen in the past 6 months Yes    How many times? 4    Has the patient had a decrease in activity level because of a fear of falling?  No    Is the patient reluctant to leave their home because of a fear of falling?  No      Home  Environment   Family/patient expects to be discharged to: Private residence    Living Arrangements Alone    Available Help at Discharge Friend(s)   comes 2 hrs 5 days a week to assist and provide transportation   Type of [place] Access Level entry    [place] One level    Writer   with 4-6" ledge   Bathroom Toilet Handicapped height    Bathroom Accessibility Yes    How accessible Accessible via walker    Adaptive equipment Reacher;Long-handled shoe horn;Sock aid    Home Equipment [place] - 4 wheels;Shower seat    Lives With Alone      Prior Function   Level of Independence Independent with basic ADLs    Vocation Retired    Leisure walking      ADL   ADL comments Mod I with all bathing, dressing, and bathroom transfers.  Pt does report increased fatigue post showering.  Discussed use of DME with pt stating using shower seat and leaving door cracked to increase air flow.      IADL   Meal Prep Able to complete simple warm meal prep   Mod I.  Uses a lot of prepackaged foods to cook in oven or microwave.     Written Expression   Dominant Hand Right    Handwriting 75% legible   Pt reports handwriting varies from more to less legible     Vision - History   Baseline Vision Wears glasses only for reading    Visual History Cataracts   chronic dry eye     Vision Assessment   Eye Alignment Within Functional Limits    Ocular Range of Motion Within Functional Limits    Alignment/Gaze Preference Within Defined Limits    Tracking/Visual Pursuits Able to track stimulus in all quads without difficulty    Saccades Within functional limits    Visual Fields No  apparent deficits      Activity Tolerance   Activity Tolerance Tolerate 30+ min activity without fatigue    Activity Tolerance Comments Pt reports fatigue towards end of session as having PT prior to OT      Cognition   Overall Cognitive Status Within Functional Limits for tasks assessed    Attention Selective;Alternating    Selective Attention Appears intact    Memory Appears intact    Awareness Appears intact      Sensation   Light Touch Impaired by gross assessment   c/o neuropathy in B feet     Coordination   Gross Motor Movements are Fluid and Coordinated No    Fine Motor Movements are Fluid and Coordinated No    Coordination and Movement Description mild decreased coordination LUE    9 Hole Peg Test Right;Left    Right 9 Hole Peg Test 37.4    Left 9 Hole Peg Test 40.3      ROM / Strength   AROM / PROM / Strength AROM;Strength      AROM   AROM Assessment Site Shoulder;Elbow    Right/Left Shoulder Right;Left    Right Shoulder Flexion 130 Degrees    Left Shoulder Flexion 140 Degrees    Right/Left Elbow Right;Left   WNL     Strength   Overall Strength Comments in sitting    Strength Assessment Site Shoulder;Elbow    Right/Left Shoulder Right;Left   4+/5 bilaterally     Hand Function   Right Hand Grip (  lbs) 30    Left Hand Grip (lbs) 30                              OT Education - 07/18/21 1127     Education Details energy conservation 4 P's and shoulder and elbow theraband exercises    Person(s) Educated Patient    Methods Explanation;Demonstration    Comprehension Verbalized understanding              OT Short Term Goals - 07/18/21 1136       OT SHORT TERM GOAL #1   Title Pt will verbalize understanding of 3 compensatory strategies for low vision.    Time 4    Period Weeks    Status New    Target Date 08/15/21      OT SHORT TERM GOAL #2   Title Pt will improve hand strength and endurance to improve handwriting legiblity to 90%     Time 4    Period Weeks      OT SHORT TERM GOAL #3   Title Pt will demonstrate improved activity tolerance to complete standing acitivity for 15 mins without seated rest break.    Time 4    Period Weeks      OT SHORT TERM GOAL #4   Title Pt will improve shoulder ROM to 140* in RUE to increase functional reach in to cabinets    Time 4    Period Weeks    Status New               OT Long Term Goals - 07/18/21 1139       OT LONG TERM GOAL #1   Title Pt will be independent in energy conservation strategies.    Time 8    Period Weeks    Status New    Target Date 09/12/21      OT LONG TERM GOAL #2   Title Pt will be independent in modifications for low vision.    Time 8    Period Weeks    Status New      OT LONG TERM GOAL #3   Title Pt wil be able to complete housekeeping task incorporating reaching outside BOS without reports of dizziness.    Time 8    Period Weeks    Status New      OT LONG TERM GOAL #4   Title Pt will be able to complete dual task activity without cues for sequencing and with improved safety awareness.    Time 8    Period Weeks    Status New                   Plan - 07/18/21 1155     Clinical Impression Statement Pt is a 75 y/o female who presents to OP OT due to nontraumatic subcortical hemorrhage of left cerebral hemisphere. Pt currently lives alone in a one level condo and is retired prior to onset. PMHx includes hyperlipidemia, atrial fibrillation with pacemaker on Eliquis, lymphoma, confusion. Pt will benefit from skilled occupational therapy services to address strength and endurance, ROM, balance, fine motor control, safety awareness, introduction of compensatory strategies/AE prn, energy conservation, vision, and implementation of an HEP to improve participation and safety during ADLs and IADLs.    OT Occupational Profile and History Detailed Assessment- Review of Records and additional review of physical, cognitive, psychosocial  history related to current functional performance  Occupational performance deficits (Please refer to evaluation for details): ADL's;IADL's;Leisure;Social Participation    Body Structure / Function / Physical Skills ADL;Balance;Body mechanics;Cardiopulmonary status limiting activity;Coordination;Endurance;FMC;GMC;IADL;Mobility;Pain;ROM;Proprioception;Sensation;Strength;UE functional use;Vision    Psychosocial Skills Environmental  Adaptations;Routines and Behaviors    Rehab Potential Good    Clinical Decision Making Limited treatment options, no task modification necessary    Comorbidities Affecting Occupational Performance: May have comorbidities impacting occupational performance    Modification or Assistance to Complete Evaluation  No modification of tasks or assist necessary to complete eval    OT Frequency 2x / week    OT Duration 8 weeks    OT Treatment/Interventions Self-care/ADL training;Cryotherapy;Electrical Stimulation;Ultrasound;Moist Heat;Fluidtherapy;Therapeutic exercise;Neuromuscular education;Energy conservation;Manual Therapy;Functional Mobility Training;DME and/or AE instruction;Passive range of motion;Patient/family education;Visual/perceptual remediation/compensation;Cognitive remediation/compensation;Therapeutic activities;Balance training;Psychosocial skills training;Coping strategies training    Plan Vision handout for low vision and explore accommodations.  Focus on activity tolerance and reaching outside BOS    OT Home Exercise Plan shoulder and elbow theraband    Consulted and Agree with Plan of Care Patient             Patient will benefit from skilled therapeutic intervention in order to improve the following deficits and impairments:   Body Structure / Function / Physical Skills: ADL, Balance, Body mechanics, Cardiopulmonary status limiting activity, Coordination, Endurance, FMC, GMC, IADL, Mobility, Pain, ROM, Proprioception, Sensation, Strength, UE functional  use, Vision   Psychosocial Skills: Environmental  Adaptations, Routines and Behaviors   Visit Diagnosis: Blindness, one eye, low vision other eye, unspecified eyes - Plan: Ot plan of care cert/re-cert  Attention and concentration deficit  Muscle weakness (generalized) - Plan: Ot plan of care cert/re-cert  Unsteadiness on feet    Problem List Patient Active Problem List   Diagnosis Date Noted   Hypertension    Pressure injury of skin 03/15/2021   Intracranial hemorrhage ([place]) 03/14/2021   Lower extremity edema    Polymicrobial bacterial infection    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    Enterococcus faecalis infection    Sepsis due to Escherichia coli ([place])    Debility 02/13/2021   Infection of wound hematoma 02/05/2021   Physical debility 12/22/2020   Tear of left hamstring 12/22/2020   Labral tear of left hip joint 12/22/2020   Acute blood loss anemia    Orthostatic hypotension 12/17/2020   History of CVA (cerebrovascular accident) 12/17/2020   Fall 12/15/2020   Hip hematoma, left, initial encounter 12/15/2020   Nondisplaced fracture of coronoid process of left ulna, initial encounter for closed fracture 12/15/2020   Multiple rib fractures 12/15/2020   COVID-19 virus infection 10/12/2020   Constipation 06/22/2020   Change in bowel habits 06/22/2020   Diverticulosis 06/22/2020   Dark stools 06/22/2020   Hemorrhoids 06/22/2020   Pacemaker 01/23/2020   Junctional rhythm 01/23/2020   Palpitations 01/23/2020   ICH (intracerebral hemorrhage) ([place]) 12/02/2019   A-fib ([place]) 12/02/2019   Anemia 12/02/2019   QT prolongation 12/02/2019   Hypothyroidism 12/02/2019   Gait abnormality 07/10/2018   S/P reverse total shoulder arthroplasty, 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   Persistent atrial fibrillation ([place])    Bilateral ureteral calculi 11/17/2017   Atrial fibrillation with RVR ([place]) 09/15/2017   Atrial flutter ([place]) 07/17/2017   Port catheter in place 04/02/2017   Non-Hodgkin  lymphoma, unspecified, intrathoracic lymph nodes ([place]) 02/13/2017   Mediastinal mass 02/06/2017   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   Abnormal chest x-ray    Lung mass 02/03/2017   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  OSA (obstructive sleep apnea) 02/03/2015   History of renal calculi 11/16/2014   Renal calculi 11/16/2014   Family history of colon cancer 10/26/2014   Mechanical complication due to cardiac pacemaker pulse generator 11/06/2013   Dyspnea 05/12/2013   Hypothyroidism 04/21/2013   Pleural effusion 04/19/2013   Long term (current) use of anticoagulants 12/20/2010   EPIDERMOID CYST 08/22/2010   Chronic diastolic heart failure ([place]) 08/06/2010   SYNCOPE AND COLLAPSE 07/27/2010   OSTEOARTHRITIS, KNEE, RIGHT 03/15/2010   Class 1 obesity due to excess calories with body mass index (BMI) of 33.0 to 33.9 in adult 12/07/2009   NONSPEC ELEVATION OF LEVELS OF TRANSAMINASE/LDH 09/08/2009   BREAST CANCER, HX OF 07/25/2009   COLONIC POLYPS, HX OF 07/25/2009   TUBULOVILLOUS ADENOMA, COLON 04/29/2008   HYPERGLYCEMIA, FASTING 04/29/2008   HYPERLIPIDEMIA 06/22/2007   CARCINOMA, THYROID GLAND, HX OF 06/22/2007    Simonne Come, OT/L 07/19/2021, 2:38 PM  [facility] [address], [place], [place], [address] Phone: [phone]   Fax:  [phone]  Name: Kayla Harrison MRN: [id number] Date of Birth: 1946/05/21

## 2021-07-20 ENCOUNTER — Inpatient Hospital Stay: Payer: Medicare Other | Admitting: Physical Medicine and Rehabilitation

## 2021-07-24 ENCOUNTER — Ambulatory Visit: Payer: Self-pay | Admitting: Physical Therapy

## 2021-07-24 ENCOUNTER — Encounter: Payer: Self-pay | Admitting: Occupational Therapy

## 2021-07-24 ENCOUNTER — Encounter: Payer: Self-pay | Admitting: Internal Medicine

## 2021-07-24 ENCOUNTER — Encounter: Payer: Self-pay | Admitting: Physical Medicine and Rehabilitation

## 2021-07-24 ENCOUNTER — Other Ambulatory Visit: Payer: Self-pay

## 2021-07-24 ENCOUNTER — Ambulatory Visit (INDEPENDENT_AMBULATORY_CARE_PROVIDER_SITE_OTHER): Payer: Medicare Other | Admitting: Internal Medicine

## 2021-07-24 ENCOUNTER — Encounter
Payer: Medicare Other | Attending: Physical Medicine and Rehabilitation | Admitting: Physical Medicine and Rehabilitation

## 2021-07-24 VITALS — BP 138/91 | HR 104 | Ht 66.0 in | Wt 189.6 lb

## 2021-07-24 VITALS — BP 114/80 | HR 103 | Ht 66.0 in | Wt 186.0 lb

## 2021-07-24 DIAGNOSIS — R296 Repeated falls: Secondary | ICD-10-CM | POA: Diagnosis not present

## 2021-07-24 DIAGNOSIS — I495 Sick sinus syndrome: Secondary | ICD-10-CM | POA: Diagnosis not present

## 2021-07-24 DIAGNOSIS — H5410 Blindness, one eye, low vision other eye, unspecified eyes: Secondary | ICD-10-CM | POA: Diagnosis not present

## 2021-07-24 DIAGNOSIS — I610 Nontraumatic intracerebral hemorrhage in hemisphere, subcortical: Secondary | ICD-10-CM

## 2021-07-24 DIAGNOSIS — R2681 Unsteadiness on feet: Secondary | ICD-10-CM | POA: Insufficient documentation

## 2021-07-24 DIAGNOSIS — R41840 Attention and concentration deficit: Secondary | ICD-10-CM | POA: Insufficient documentation

## 2021-07-24 DIAGNOSIS — M6281 Muscle weakness (generalized): Secondary | ICD-10-CM | POA: Insufficient documentation

## 2021-07-24 DIAGNOSIS — S7002XA Contusion of left hip, initial encounter: Secondary | ICD-10-CM

## 2021-07-24 DIAGNOSIS — R000 Tachycardia, unspecified: Secondary | ICD-10-CM

## 2021-07-24 DIAGNOSIS — R2689 Other abnormalities of gait and mobility: Secondary | ICD-10-CM | POA: Insufficient documentation

## 2021-07-24 DIAGNOSIS — Z950 Presence of cardiac pacemaker: Secondary | ICD-10-CM | POA: Diagnosis not present

## 2021-07-24 DIAGNOSIS — I951 Orthostatic hypotension: Secondary | ICD-10-CM

## 2021-07-24 DIAGNOSIS — I4819 Other persistent atrial fibrillation: Secondary | ICD-10-CM | POA: Diagnosis not present

## 2021-07-24 MED ORDER — PRAMOXINE HCL 1 % EX LOTN: 1.0000 "application " | TOPICAL_LOTION | Freq: Two times a day (BID) | CUTANEOUS | 0 refills | Status: DC

## 2021-07-24 NOTE — Patient Instructions (Signed)
Foods that increase NAC:   Multimedia programmer Chicken Breast Tuna Lentils Oatmeal Eggs Low-Fat Yogurt Sunflower Seeds Swiss Cheese

## 2021-07-24 NOTE — Patient Instructions (Signed)
Medication Instructions:  Your physician recommends that you continue on your current medications as directed. Please refer to the Current Medication list given to you today.  *If you need a refill on your cardiac medications before your next appointment, please call your pharmacy*   Lab Work: None ordered.  If you have labs (blood work) drawn today and your tests are completely normal, you will receive your results only by: [place] (if you have MyChart) OR A paper copy in the mail If you have any lab test that is abnormal or we need to change your treatment, we will call you to review the results.   Testing/Procedures: None ordered.    Follow-Up: At [facility], you and your health needs are our priority.  As part of our continuing mission to provide you with exceptional heart care, we have created designated Provider Care Teams.  These Care Teams include your primary Cardiologist (physician) and Advanced Practice Providers (APPs -  Physician Assistants and Nurse Practitioners) who all work together to provide you with the care you need, when you need it.  We recommend signing up for the patient portal called "MyChart".  Sign up information is provided on this After Visit Summary.  MyChart is used to connect with patients for Virtual Visits (Telemedicine).  Patients are able to view lab/test results, encounter notes, upcoming appointments, etc.  Non-urgent messages can be sent to your provider as well.   To learn more about what you can do with MyChart, go to [email or web address].    Your next appointment:   6 months with Dr Caryl Comes

## 2021-07-24 NOTE — Progress Notes (Signed)
Subjective:    Patient ID: Kayla Harrison, female    DOB: [birth year], 75 y.o.   MRN: [id number]  HPI  Kayla Harrison is a 75 year old woman who presents for hospital follow-up after ICH.   1) Left hip hematoma: -her walking has improved -her energy has improved -she feels there is still one suture in place, and she is right. I have pulled it out. Discussed that it appears to be healing very well. She is concerned about the darkened area.   2) Right arm hematoma:  -much better.   3) Bilateral lower extremity lymphedema: -Needs a refill of her Lasix.  -she has not done lymphedema therapy -she has compression garments for   4) Hypotension: -improved -tending to be on the edge -currently 138/91- she is aware that both numbers and slightly high -no more dizziness.  -she has been off metoprolol.   5) Insomnia: -trazodone leaves her drugged the next day  6) Loose stools: -no more -small Bm this morning.   7) Hypoalbuminemia: -has been trying to eat more nuts  8) Impaired mobility:  -she has gotten deconditioned again, has not been receiving home therapy as frequently as she would like -her friend is helping her driving and grocery shopping.   9) ICH: -has ocassional memory loss -she has a lady who helps her and she says she always finds fault with her. -she asks when she can return to driving.    Pain Inventory Average Pain 0 Pain Right Now 0 My pain is intermittent and burning  In the last 24 hours, has pain interfered with the following? General activity 0 Relation with others 0 Enjoyment of life 0 What TIME of day is your pain at its worst? morning  Sleep (in general) NA  Has trouble staying asleep  Pain is worse with: walking Pain improves with: rest Relief from Meds: 0  use a walker  going to outpt therapy  I need assistance with the following:  household duties and shopping  tingling  Any changes since last visit?  yes  Saw the surgeon  yesterday  Any changes since last visit?  no      Family History  Problem Relation Age of Onset   Heart disease Father 55       MI _0    Colon cancer Father        COLON   Heart attack Father    Other Mother        temporal arteritis    Diabetes Sister    Diabetes Brother    Diabetes Paternal Aunt    Diabetes Paternal Grandmother    Esophageal cancer Neg Hx    Inflammatory bowel disease Neg Hx    Liver disease Neg Hx    Pancreatic cancer Neg Hx    Stomach cancer Neg Hx    Social History   Socioeconomic History   Marital status: Single    Spouse name: Not on file   Number of children: 0   Years of education: Not on file   Highest education level: Master's degree (e.g., MA, MS, MEng, MEd, MSW, MBA)  Occupational History   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[employer]    Employer: RETIRED  Tobacco Use   Smoking status: Never   Smokeless tobacco: Never  Vaping Use   Vaping Use: Never used  Substance and Sexual Activity   Alcohol use: No    Comment: none since 1990   Drug use: Never   Sexual activity:  Not Currently    Birth control/protection: Surgical  Other Topics Concern   Not on file  Social History Narrative   ** Merged History Encounter **       SINGLE  NO CHILDREN NEVER SMOKED EXERCISE 3 X WK RETIRED NO CAFFEINE      Social Determinants of Health   Financial Resource Strain: Not on file  Food Insecurity: Not on file  Transportation Needs: Not on file  Physical Activity: Not on file  Stress: Not on file  Social Connections: Not on file   Past Surgical History:  Procedure Laterality Date   Petaluma ablation     a. 2008 PVI b. 2014 convergent ablation   APPENDECTOMY     BONE MARROW BIOPSY  02/21/2017   BREAST LUMPECTOMY Left 2010   [place]     2015- negative   CARDIOVERSION  10/09/2012   Procedure: CARDIOVERSION;  Surgeon: Minus Breeding, MD;  Location: [place];   Service: Cardiovascular;  Laterality: N/A;   CARDIOVERSION  10/09/2012   Procedure: CARDIOVERSION;  Surgeon: Minus Breeding, MD;  Location: [name] ENDOSCOPY;  Service: Cardiovascular;  Laterality: N/A;  [name] gave the ok to add pt to the add on , but we must check to find out if the can add pt on at [phone])   CARDIOVERSION N/A 11/20/2012   Procedure: CARDIOVERSION;  Surgeon: Fay Records, MD;  Location: [place];  Service: Cardiovascular;  Laterality: N/A;   CARDIOVERSION N/A 07/18/2017   Procedure: CARDIOVERSION;  Surgeon: Thayer Headings, MD;  Location: [place];  Service: Cardiovascular;  Laterality: N/A;   CARDIOVERSION N/A 10/03/2017   Procedure: CARDIOVERSION;  Surgeon: Sanda Klein, MD;  Location: [place];  Service: Cardiovascular;  Laterality: N/A;   CARDIOVERSION N/A 01/07/2018   Procedure: CARDIOVERSION;  Surgeon: Nahser, Wonda Cheng, MD;  Location: [place];  Service: Cardiovascular;  Laterality: N/A;   CARDIOVERSION N/A 12/10/2019   Procedure: CARDIOVERSION;  Surgeon: Buford Dresser, MD;  Location: [place];  Service: Cardiovascular;  Laterality: N/A;   CARDIOVERSION N/A 03/09/2021   Procedure: CARDIOVERSION;  Surgeon: Pixie Casino, MD;  Location: [place];  Service: Cardiovascular;  Laterality: N/A;   CHOLECYSTECTOMY     COLONOSCOPY W/ POLYPECTOMY     Dr Earlean Shawl   CYSTOSCOPY N/A 02/06/2015   Procedure: CYSTOSCOPY;  Surgeon: Kathie Rhodes, MD;  Location: [facility];  Service: Urology;  Laterality: N/A;   CYSTOSCOPY W/ RETROGRADES Left 11/17/2017   Procedure: CYSTOSCOPY WITH RETROGRADE /PYELOGRAM/;  Surgeon: Kathie Rhodes, MD;  Location: [facility];  Service: Urology;  Laterality: Left;   CYSTOSCOPY WITH RETROGRADE PYELOGRAM, URETEROSCOPY AND STENT PLACEMENT Right 02/06/2015   Procedure: RETROGRADE PYELOGRAM, RIGHT URETEROSCOPY STENT PLACEMENT;  Surgeon: Kathie Rhodes, MD;  Location: [facility];  Service: Urology;  Laterality: Right;   CYSTOSCOPY WITH RETROGRADE  PYELOGRAM, URETEROSCOPY AND STENT PLACEMENT Right 03/07/2017   Procedure: CYSTOSCOPY WITH RIGHT RETROGRADE PYELOGRAM,RIGHT  URETEROSCOPYLASER LITHOTRIPSY  AND STENT PLACEMENT AND STONE BASKETRY;  Surgeon: Kathie Rhodes, MD;  Location: [place];  Service: Urology;  Laterality: Right;   EYE SURGERY     cataract surgery   fatty mass removal  1999   pubic area   HOLMIUM LASER APPLICATION N/A 12/30/8117   Procedure: HOLMIUM LASER APPLICATION;  Surgeon: Kathie Rhodes, MD;  Location: [facility];  Service: Urology;  Laterality: N/A;   HOLMIUM LASER APPLICATION Right 1/47/8295   Procedure: HOLMIUM LASER APPLICATION;  Surgeon:  Kathie Rhodes, MD;  Location: [facility];  Service: Urology;  Laterality: Right;   HOLMIUM LASER APPLICATION Left 4/74/2595   Procedure: HOLMIUM LASER APPLICATION;  Surgeon: Kathie Rhodes, MD;  Location: [facility];  Service: Urology;  Laterality: Left;   I & D EXTREMITY Left 12/19/2020   Procedure: IRRIGATION AND DEBRIDEMENT OF LEFT HIP HEMATOMA WITH APPLICATION OF WOUND VAC;  Surgeon: Erle Crocker, MD;  Location: [place];  Service: Orthopedics;  Laterality: Left;   I & D EXTREMITY Left 02/06/2021   Procedure: IRRIGATION AND DEBRIDEMENT DEEP ABCESS LEFT THIGH, SECONDARY CLOSURE OF WOUND DEHISCENCE;  Surgeon: Erle Crocker, MD;  Location: [place];  Service: Orthopedics;  Laterality: Left;   IR FLUORO GUIDE PORT INSERTION RIGHT  02/24/2017   IR NEPHROSTOMY PLACEMENT RIGHT  11/17/2017   IR PATIENT EVAL TECH 0-60 MINS  03/11/2017   IR REMOVAL TUN ACCESS W/ PORT W/O FL MOD SED  04/20/2018   IR US GUIDE VASC ACCESS RIGHT  02/24/2017   KNEE ARTHROSCOPY     bilateral   LAPAROSCOPIC GASTRIC BANDING  07/10/2010   LAPAROSCOPIC GASTRIC BANDING     Laparoscopic adjustable banding APS System with posterior hiatal hernia, 2 suture.   LAPAROTOMY     for ruptured ovary and ovarian artery    NEPHROLITHOTOMY Right 11/17/2017   Procedure: NEPHROLITHOTOMY PERCUTANEOUS;   Surgeon: Kathie Rhodes, MD;  Location[facility];  Service: Urology;  Laterality: Right;   PACEMAKER INSERTION  03/10/2013   MDT dual chamber PPM   POCKET REVISION N/A 12/08/2013   Procedure: POCKET REVISION;  Surgeon: Deboraha Sprang, MD;  [facility] CATH LAB;  Service: Cardiovascular;  Laterality: N/A;   PORTA CATH INSERTION     REVERSE SHOULDER ARTHROPLASTY Right 05/14/2018   Procedure: RIGHT REVERSE SHOULDER ARTHROPLASTY;  Surgeon: Tania Ade, MD;  Location: [place];  Service: Orthopedics;  Laterality: Right;   REVERSE SHOULDER REPLACEMENT Right 05/14/2018   RIGHT HEART CATH N/A 07/21/2019   Procedure: RIGHT HEART CATH;  Surgeon: Larey Dresser, MD;  Location: [place] CV LAB;  Service: Cardiovascular;  Laterality: N/A;   TEE WITH CARDIOVERSION  09/22/2017   TEE WITHOUT CARDIOVERSION N/A 10/03/2017   Procedure: TRANSESOPHAGEAL ECHOCARDIOGRAM (TEE);  Surgeon: Sanda Klein, MD;  Location: [facility] ENDOSCOPY;  Service: Cardiovascular;  Laterality: N/A;   TEE WITHOUT CARDIOVERSION N/A 08/04/2019   Procedure: TRANSESOPHAGEAL ECHOCARDIOGRAM (TEE);  Surgeon: Larey Dresser, MD;  Location: [facility] ENDOSCOPY;  Service: Cardiovascular;  Laterality: N/A;   TEE WITHOUT CARDIOVERSION N/A 12/10/2019   Procedure: TRANSESOPHAGEAL ECHOCARDIOGRAM (TEE);  Surgeon: Buford Dresser, MD;  Location: [facility] ENDOSCOPY;  Service: Cardiovascular;  Laterality: N/A;   TEE WITHOUT CARDIOVERSION N/A 03/09/2021   Procedure: TRANSESOPHAGEAL ECHOCARDIOGRAM (TEE);  Surgeon: Pixie Casino, MD;  Location: [place];  Service: Cardiovascular;  Laterality: N/A;   THYROIDECTOMY  1998   Dr Margot Chimes   TONSILLECTOMY     TOTAL KNEE ARTHROPLASTY  04/13/2012   Procedure: TOTAL KNEE ARTHROPLASTY;  Surgeon: Rudean Haskell, MD;  Location: [place];  Service: Orthopedics;  Laterality: Right;   VIDEO BRONCHOSCOPY WITH ENDOBRONCHIAL ULTRASOUND N/A 02/07/2017   Procedure: VIDEO BRONCHOSCOPY WITH ENDOBRONCHIAL ULTRASOUND;  Surgeon: Marshell Garfinkel, MD;  Location: [place];  Service: Pulmonary;  Laterality: N/A;   Past Medical History:  Diagnosis Date   Anemia    Arthritis    osteoarthritis - knees and right shoulder   Blood transfusion without reported diagnosis    Breast cancer ([place])  Dr Streck, total thyroidectomy- 1999- for cancer   Brucellosis 1964   Chronic bilateral pleural effusions    Colon polyp    Dr Medoff   Complication of anesthesia    Ketamine produces LSD reaction, bright colored nightmarish experience    Dyslipidemia    Endometriosis    Fibroids    H/O pleural effusion    s/p thoracentesis w 3200ml withdrawn   Hepatitis    [place] ?hepatitis    History of dysphagia    due to radiation therapy   History of hiatal hernia    small noted on PET scan   Hypertension    Hypothyroidism    Lung cancer, lower lobe (HCC) 01/2017   radiation RX completed 03/04/17; will start chemo 6/27, pt unaware of lung cancer   Morbid obesity (HCC)    Status post lap band surgery   Nephrolithiasis    Non Hodgkin's lymphoma (HCC)    on chemotherapy   Persistent atrial fibrillation (HCC)    a. s/p PVI 2008 b. s/p convergent ablation 2014 complicated by bradycardia requiring pacemaker implant   Personal history of radiation therapy    Presence of permanent cardiac pacemaker    Rotator cuff tear    Right   Stroke (HCC)    2003- [place] x2   SVC syndrome    with lung mass and non hodgkins lymphoma   Thyroid cancer (HCC) 2000   BP (!) 138/91   Pulse (!) 104   Ht 5' 6" (1.676 m)   Wt 189 lb 9.6 oz (86 kg)   SpO2 96%   BMI 30.60 kg/m   Opioid Risk Score:   Fall Risk Score:  `1  Depression screen PHQ 2/9  Depression screen PHQ 2/9 07/24/2021 03/05/2021 01/18/2021 08/24/2019  Decreased Interest 0 0 0 0  Down, Depressed, Hopeless 0 0 0 0  PHQ - 2 Score 0 0 0 0  Altered sleeping - - 3 1  Tired, decreased energy - - 1 1  Change in appetite - - 1 0  Feeling bad or failure about  yourself  - - 0 0  Trouble concentrating - - 1 0  Moving slowly or fidgety/restless - - 0 0  Suicidal thoughts - - 0 0  PHQ-9 Score - - 6 -  Difficult doing work/chores - - - Not difficult at all  Some recent data might be hidden    Review of Systems  Constitutional: Negative.   HENT: Negative.    Eyes: Negative.   Respiratory: Negative.    Cardiovascular: Negative.   Gastrointestinal: Negative.   Endocrine: Negative.   Genitourinary: Negative.   Musculoskeletal:        Tingling at incision site left hip  Skin:  Positive for wound.       Incision site left hip  Allergic/Immunologic: Negative.   Neurological: Negative.        Tingling but it is more incisional  Hematological: Negative.   Psychiatric/Behavioral: Negative.    All other systems reviewed and are negative.     Objective:   Physical Exam Gen: no distress, normal appearing HEENT: oral mucosa pink and moist, NCAT Cardio: Reg rate Chest: normal effort, normal rate of breathing Abd: soft, non-distended Ext: 2+ edema right leg, 3+ left leg Psych: pleasant, normal affect Skin: left hip and medial thigh evaluated, indurated, C/D/I, one remaining suture in left hip removed.   Neuo: Alert and oriented x3    Assessment &   Plan:  1) Left hip hematoma: -Average pain is 0/10 -continue to monitor daily -educated regarding the importance of orthopedic follow-up -evaluated and assured regarding good healing -continue therapy, communicated with outpatient therapy RETURN TO DRIVING PLAN:  WITH THE SUPERVISION OF A LICENSED DRIVER, PLEASE DRIVE IN AN EMPTY PARKING LOT FOR AT LEAST 2-3 TRIALS TO TEST REACTION TIME, VISION, USE OF EQUIPMENT IN CAR, ETC.  IF SUCCESSFUL WITH THE PARKING LOT DRIVING, PROCEED TO SUPERVISED DRIVING TRIALS IN YOUR NEIGHBORHOOD STREETS AT LOW TRAFFIC TIMES TO TEST OBSERVATION TO TRAFFIC SIGNALS, REACTION TIME, ETC. PLEASE ATTEMPT AT LEAST 2-3 TRIALS IN YOUR NEIGHBORHOOD.  IF NEIGHBORHOOD DRIVING  IS SUCCESSFUL, YOU MAY PROCEED TO DRIVING IN BUSIER AREAS IN YOUR COMMUNITY WITH SUPERVISION OF A LICENSED DRIVER. PLEASE ATTEMPT AT LEAST 4-5 TRIALS.  IF COMMUNITY DRIVING IS SUCCESSFUL, YOU MAY PROCEED TO DRIVING ALONE, DURING THE DAY TIME, IN NON-PEAK TRAFFIC TIMES. YOU SHOULD DRIVE NO FURTHER    2) Right arm hematoma:  -much better.  -continue to monitor -repeat Hgb with next blood draw  3) Bilateral lower extremity lymphedema: -Needs a refill of her Lasix, provided. Educated regarding risks of hypotension and AKI -she has not done lymphedema therapy, prescribed at cancer center -educated that etiology could be her lymphoma  4) Hypotension: -improved -discussed that current BP is stable for her- do not want to drop too low given her prior symptomatic hypotension which puts her at risk for fall.   5)Insomnia: -trazodone leaves her drugged the next day -discussed melatonin, magnesium, magnesium rich and melatonin producing foods -Try to go outside near sunrise -Get exercise during the day.  -Discussed good sleep hygiene: turning off all devices an hour before bedtime.   6) Loose stools: -no more -small Bm this morning.  -discussed foods that are good for constipation  7) Hypoalbuminemia: -has been trying to eat more nuts, commended on this.   8) Impaired mobility:  -she has gotten deconditioned again, has not been receiving home therapy as frequently as she would like -encouraged daily walking with her friends when available, and safely when she has to on her own, wearing her life alert.   9) Tachycardia -she is feeling more energy off lopressor, discussed this would be fine at this time as lopressor does cause side effect of fatigue which has been debilitating for her.    10) impaired memory following ICH -has returned to close to baseline.  -would benefit from handicap placard to increase her mobility in the community -provided dietary and exercise  counseling -discussed strategies to help her regain her independence 

## 2021-07-24 NOTE — Progress Notes (Signed)
Patient Care Team: [name], MD as PCP - General (Endocrinology) Deboraha Sprang, MD as PCP - Cardiology (Cardiology) Deboraha Sprang, MD as PCP - Electrophysiology (Cardiology) [name], MD (Endocrinology)   HPI  Kayla Harrison is a 74 y.o. female Seen in followup for a pacemaker implanted at [facility] and atrial fibrillation.  She underwent pulmonary vein isolation at [facility] and had recurrent atrial fibrillation and underwent convergent ablation at [facility] 8/27; it was complicated by heart block prompting pacemaker implantation.  She has had recurrent aFib and  was restarted on amio--and then stopped 2/2 worsening SOB amiodarone resumed and then adjunctive ranolazine  She has a history of prior strokes and remains on oral anticoagulation .  Symptomatic orthostasis; tried an abdominal binder without improvement.  Fall 3/22 2/2 orthostasis assoc with labral hamstring tendon tear, repair of which was cx by large hematoma requiring VAC and I&D; subsequent dehiscence and abscess requiring another I&D   6/22 admitted with altered mental status >> CT- acute intracranial hemorrhage thought 2/2 hypertension , anticoagulation was reversed and has not resumed; has been maintained on amio   Discharged to rehab and then home.    She is gradually getting stronger.  Has struggled with extreme fatigue taking 2-3 naps a day now but previously was taking 3 or 4. Scant lightheadedness with standing.  Mild shortness of breath.  No chest pain.       5/18 presented w SOB and facial fullness, found to have SVC syndrome 2/2 NHlymphoma Rx initially with XRT  Now in remission     Date Cr K TSH ALT Hgb  8/18   0.2 15 11.9  9/18                       9.3  3/19   0.722 (12/18) 23 11.2  4/20    18 13.2  11/20 1.22 4.5 0.74 (7/20) 17(7/20) 11.9  3/21 1.06 4.2 0.82 19 12.8          DATE TEST EF   2011 Echo  55 % LAE 40 mm  7/14 Echo  55-60 %   9/18 Echo  55-60%   12/18 Echo   55-60% Severe LAE ?  11/20 TEE 55%  moderate MR/TR mild MS  6/22 Echo  55-60%       Remote morbid obesity status>>  post lap band surgery    Thromboembolic risk factors ( age -3 , TIA/CVA-2, Gender-1) for a CHADSVASc Score of 5   Past Medical History:  Diagnosis Date   Anemia    Arthritis    osteoarthritis - knees and right shoulder   Blood transfusion without reported diagnosis    Breast cancer ([place])    Dr Margot Chimes, total thyroidectomy- 1999- for cancer   Brucellosis 1964   Chronic bilateral pleural effusions    Colon polyp    Dr Earlean Shawl   Complication of anesthesia    Ketamine produces LSD reaction, bright colored nightmarish experience    Dyslipidemia    Endometriosis    Fibroids    H/O pleural effusion    s/p thoracentesis w 3258ml withdrawn   Hepatitis    [place], ?hepatitis    History of dysphagia    due to radiation therapy   History of hiatal hernia    small noted on PET scan   Hypertension    Hypothyroidism    Lung cancer, lower lobe (  [place]) 01/2017   radiation RX completed 03/04/17; will start chemo 6/27, pt unaware of lung cancer   Morbid obesity ([place])    Status post lap band surgery   Nephrolithiasis    Non Hodgkin's lymphoma ([place])    on chemotherapy   Persistent atrial fibrillation ([place])    a. s/p PVI 2008 b. s/p convergent ablation 6967 complicated by bradycardia requiring pacemaker implant   Personal history of radiation therapy    Presence of permanent cardiac pacemaker    Rotator cuff tear    Right   Stroke ([place])    2003- [place] x2   SVC syndrome    with lung mass and non hodgkins lymphoma   Thyroid cancer ([place]) 2000    Past Surgical History:  Procedure Laterality Date   ABDOMINAL HYSTERECTOMY  1983   afib ablation     a. 2008 PVI b. 2014 convergent ablation   APPENDECTOMY     BONE MARROW BIOPSY  02/21/2017   BREAST LUMPECTOMY Left 2010   [place]     2015- negative    CARDIOVERSION  10/09/2012   Procedure: CARDIOVERSION;  Surgeon: Minus Breeding, MD;  Location: [place];  Service: Cardiovascular;  Laterality: N/A;   CARDIOVERSION  10/09/2012   Procedure: CARDIOVERSION;  Surgeon: Minus Breeding, MD;  Location: [facility] ENDOSCOPY;  Service: Cardiovascular;  Laterality: N/A;  [name] gave the ok to add pt to the add on , but we must check to find out if the can add pt on at [phone])   CARDIOVERSION N/A 11/20/2012   Procedure: CARDIOVERSION;  Surgeon: Fay Records, MD;  Location: [place];  Service: Cardiovascular;  Laterality: N/A;   CARDIOVERSION N/A 07/18/2017   Procedure: CARDIOVERSION;  Surgeon: Thayer Headings, MD;  Location: [place];  Service: Cardiovascular;  Laterality: N/A;   CARDIOVERSION N/A 10/03/2017   Procedure: CARDIOVERSION;  Surgeon: Sanda , MD;  Location: [place];  Service: Cardiovascular;  Laterality: N/A;   CARDIOVERSION N/A 01/07/2018   Procedure: CARDIOVERSION;  Surgeon: Nahser, Wonda Cheng, MD;  Location: [facility] ENDOSCOPY;  Service: Cardiovascular;  Laterality: N/A;   CARDIOVERSION N/A 12/10/2019   Procedure: CARDIOVERSION;  Surgeon: Buford Dresser, MD;  Location: [facility] ENDOSCOPY;  Service: Cardiovascular;  Laterality: N/A;   CARDIOVERSION N/A 03/09/2021   Procedure: CARDIOVERSION;  Surgeon: Pixie Casino, MD;  Location: [place];  Service: Cardiovascular;  Laterality: N/A;   CHOLECYSTECTOMY     COLONOSCOPY W/ POLYPECTOMY     Dr Earlean Shawl   CYSTOSCOPY N/A 02/06/2015   Procedure: CYSTOSCOPY;  Surgeon: Kathie Rhodes, MD;  Location: [facility];  Service: Urology;  Laterality: N/A;   CYSTOSCOPY W/ RETROGRADES Left 11/17/2017   Procedure: CYSTOSCOPY WITH RETROGRADE /PYELOGRAM/;  Surgeon: Kathie Rhodes, MD;  Location: [facility];  Service: Urology;  Laterality: Left;   CYSTOSCOPY WITH RETROGRADE PYELOGRAM, URETEROSCOPY AND STENT PLACEMENT Right 02/06/2015   Procedure: RETROGRADE PYELOGRAM, RIGHT URETEROSCOPY STENT PLACEMENT;  Surgeon: Kathie Rhodes, MD;  Location: [facility];  Service: Urology;  Laterality: Right;   CYSTOSCOPY WITH RETROGRADE PYELOGRAM, URETEROSCOPY AND STENT PLACEMENT Right 03/07/2017   Procedure: CYSTOSCOPY WITH RIGHT RETROGRADE PYELOGRAM,RIGHT  URETEROSCOPYLASER LITHOTRIPSY  AND STENT PLACEMENT AND STONE BASKETRY;  Surgeon: Kathie Rhodes, MD;  Location: [place];  Service: Urology;  Laterality: Right;   EYE SURGERY     cataract surgery   fatty mass removal  1999   pubic area   HOLMIUM LASER APPLICATION N/A 0/17/5102   Procedure: HOLMIUM  LASER APPLICATION;  Surgeon: Kathie Rhodes, MD;  Location: [facility];  Service: Urology;  Laterality: N/A;   HOLMIUM LASER APPLICATION Right 03/20/3661   Procedure: HOLMIUM LASER APPLICATION;  Surgeon: Kathie Rhodes, MD;  Location: [facility];  Service: Urology;  Laterality: Right;   HOLMIUM LASER APPLICATION Left 9/47/6546   Procedure: HOLMIUM LASER APPLICATION;  Surgeon: Kathie Rhodes, MD;  Location[facility];  Service: Urology;  Laterality: Left;   I & D EXTREMITY Left 12/19/2020   Procedure: IRRIGATION AND DEBRIDEMENT OF LEFT HIP HEMATOMA WITH APPLICATION OF WOUND VAC;  Surgeon: Erle Crocker, MD;  Location: [place];  Service: Orthopedics;  Laterality: Left;   I & D EXTREMITY Left 02/06/2021   Procedure: IRRIGATION AND DEBRIDEMENT DEEP ABCESS LEFT THIGH, SECONDARY CLOSURE OF WOUND DEHISCENCE;  Surgeon: Erle Crocker, MD;  Location: [place];  Service: Orthopedics;  Laterality: Left;   IR FLUORO GUIDE PORT INSERTION RIGHT  02/24/2017   IR NEPHROSTOMY PLACEMENT RIGHT  11/17/2017   IR PATIENT EVAL TECH 0-60 MINS  03/11/2017   IR REMOVAL TUN ACCESS W/ PORT W/O FL MOD SED  04/20/2018   IR US GUIDE VASC ACCESS RIGHT  02/24/2017   KNEE ARTHROSCOPY     bilateral   LAPAROSCOPIC GASTRIC BANDING  07/10/2010   LAPAROSCOPIC GASTRIC BANDING     Laparoscopic adjustable banding APS System with posterior hiatal hernia, 2 suture.   LAPAROTOMY     for ruptured ovary  and ovarian artery    NEPHROLITHOTOMY Right 11/17/2017   Procedure: NEPHROLITHOTOMY PERCUTANEOUS;  Surgeon: Kathie Rhodes, MD;  [facility];  Service: Urology;  Laterality: Right;   PACEMAKER INSERTION  03/10/2013   MDT dual chamber PPM   POCKET REVISION N/A 12/08/2013   Procedure: POCKET REVISION;  Surgeon: Deboraha Sprang, MD;  Location: [facility] CATH LAB;  Service: Cardiovascular;  Laterality: N/A;   PORTA CATH INSERTION     REVERSE SHOULDER ARTHROPLASTY Right 05/14/2018   Procedure: RIGHT REVERSE SHOULDER ARTHROPLASTY;  Surgeon: Tania Ade, MD;  Location: [place];  Service: Orthopedics;  Laterality: Right;   REVERSE SHOULDER REPLACEMENT Right 05/14/2018   RIGHT HEART CATH N/A 07/21/2019   Procedure: RIGHT HEART CATH;  Surgeon: Larey Dresser, MD;  Location: [place] CV LAB;  Service: Cardiovascular;  Laterality: N/A;   TEE WITH CARDIOVERSION  09/22/2017   TEE WITHOUT CARDIOVERSION N/A 10/03/2017   Procedure: TRANSESOPHAGEAL ECHOCARDIOGRAM (TEE);  Surgeon: Sanda Kaiyu Mirabal, MD;  Location: [facility] ENDOSCOPY;  Service: Cardiovascular;  Laterality: N/A;   TEE WITHOUT CARDIOVERSION N/A 08/04/2019   Procedure: TRANSESOPHAGEAL ECHOCARDIOGRAM (TEE);  Surgeon: Larey Dresser, MD;  Location: [facility] ENDOSCOPY;  Service: Cardiovascular;  Laterality: N/A;   TEE WITHOUT CARDIOVERSION N/A 12/10/2019   Procedure: TRANSESOPHAGEAL ECHOCARDIOGRAM (TEE);  Surgeon: Buford Dresser, MD;  Location: [facility] ENDOSCOPY;  Service: Cardiovascular;  Laterality: N/A;   TEE WITHOUT CARDIOVERSION N/A 03/09/2021   Procedure: TRANSESOPHAGEAL ECHOCARDIOGRAM (TEE);  Surgeon: Pixie Casino, MD;  Location: [place];  Service: Cardiovascular;  Laterality: N/A;   THYROIDECTOMY  1998   Dr Margot Chimes   TONSILLECTOMY     TOTAL KNEE ARTHROPLASTY  04/13/2012   Procedure: TOTAL KNEE ARTHROPLASTY;  Surgeon: Rudean Haskell, MD;  Location: [place];  Service: Orthopedics;  Laterality: Right;   VIDEO BRONCHOSCOPY WITH ENDOBRONCHIAL  ULTRASOUND N/A 02/07/2017   Procedure: VIDEO BRONCHOSCOPY WITH ENDOBRONCHIAL ULTRASOUND;  Surgeon: Marshell Garfinkel, MD;  Location: [place];  Service: Pulmonary;  Laterality: N/A;    Current Outpatient Medications  Medication  Sig Dispense Refill   acetaminophen (TYLENOL) 325 MG tablet Take 2 tablets (650 mg total) by mouth every 4 (four) hours as needed for mild pain (or temp > 37.5 C (99.5 F)).     amiodarone (PACERONE) 200 MG tablet Take 1 tablet (200 mg total) by mouth in the morning and at bedtime.     aspirin EC 81 MG EC tablet Take 1 tablet (81 mg total) by mouth daily. Swallow whole. 30 tablet 11   levothyroxine (SYNTHROID) 137 MCG tablet Take 68.5-137 mcg by mouth See admin instructions. Taking 137 mcg tablet daily except on Saturday taking 1/2 tablet     levothyroxine (SYNTHROID) 150 MCG tablet Take 150 mcg by mouth every morning.     midodrine (PROAMATINE) 5 MG tablet Take 1 tablet (5 mg total) by mouth 2 (two) times daily with breakfast and lunch.     pramoxine (SARNA SENSITIVE) 1 % LOTN Apply 1 application topically 2 (two) times daily. 118 mL 0   rosuvastatin (CRESTOR) 10 MG tablet Take 1 tablet (10 mg total) by mouth daily. Mondays & Thursdays     senna-docusate (SENOKOT-S) 8.6-50 MG tablet Take 2 tablets by mouth 2 (two) times daily.     No current facility-administered medications for this visit.    Allergies  Allergen Reactions   Ranolazine     Balance issues   Rivaroxaban     Nose Bleed X 6 hrs ; packing in ER Nasal hemorrhage   Tape Rash and Other (See Comments)    SKIN IS THIN AND TEARS EASILY (also does not tolerate paper tape well)     Tikosyn [Dofetilide] Other (See Comments)    "Long QT wave"    Epinephrine Other (See Comments)    Oral anesthetic Dental form only (liquid). Patient stated she will become "out of it" she can hear you but cannot respond in normal fashion. "not fully with it"   Ketamine Other (See Comments)    Hallucinations     Review of  Systems negative except from HPI and PMH  Physical Exam BP 114/80 (BP Location: Left Arm, Patient Position: Sitting, Cuff Size: Normal)   Pulse (!) 103   Ht $R'5\' 6"'an$  (1.676 m)   Wt 186 lb (84.4 kg)   BMI 30.02 kg/m    Well developed and Morbidly obese in no acute distress HENT normal Neck supple  Clear Device pocket well healed; without hematoma or erythema.  There is no tethering   irregularly irregular and rhythm, no gallop No murmur Abd-soft with active BS No Clubbing cyanosis tr edema Skin-warm and dry A & Oriented  Grossly normal sensory and motor function  ECG atrial fibrillation at 102 --/11/39     Assessment and  Plan  Atrial fibrillation/flutter  S/p convergent ablation persistent  Prior Strokes  Pacemaker Medtronic MRI compatible   Non-Hodgkin's lymphoma on chemotherapy  Dyspnea on exertion     Treated hypothyroidism  High Risk Medication Surveillance-amiodarone  Junctional rhythm   Orthostatic hypotension   Intracranial hemorrhage  Fatigue    The patient remains in atrial fibrillation and flutter and is not on anticoagulation following her ICH 6/22.  She saw Dr. Leonie Man recently who recommended randomization in the aspire trial, aspirin versus apixaban in patients with ICH.  Given the fact that the patient has persistent atrial fibrillation with rapid rates I would be inclined actually to put her back on apixaban based on age and the randomization protocol but also be data from [place] we are  in CHADS-VASc course of greater than 6-7 justified given the use of warfarin and certainly NOAC has a lower bleeding risk than warfarin.  A recent [place] study found no difference in outcome for nonfatal stroke or vascular death in patients randomly assigned to apixaban or no therapy (half of whom got aspirin).    Other studies have suggested that there is a time dependent risk but    "risk declines rapidly over time " Vasc Med . 2020 Feb;25(1):55-59. doi:  [id number] [id number]. Epub 2020 Jan 13.  Moreover, her fatigue may be aggravated by her atrial fibrillation.  It is also something seen post stroke.  I have reached out and discussed with Dr Leonie Man, who is agreeable to [place] using Apixoban ( occurred after patient left)  will review with pt   For now we will continue the amiodarone 200 twice daily as augmented rate control but will have to make a decision regarding going forward and if we cannot resume the NOAC, even if for short time to get back into sinus rhythm, we will need to consider AV junction ablation.  Orthostasis is relatively quiescient.  For right now we will continue the ProAmatine 5 twice daily

## 2021-07-25 ENCOUNTER — Encounter: Payer: Self-pay | Admitting: Physical Therapy

## 2021-07-25 ENCOUNTER — Encounter: Payer: Self-pay | Admitting: Occupational Therapy

## 2021-07-25 ENCOUNTER — Ambulatory Visit: Payer: Medicare Other | Attending: Neurology | Admitting: Occupational Therapy

## 2021-07-25 ENCOUNTER — Ambulatory Visit: Payer: Medicare Other | Admitting: Physical Therapy

## 2021-07-25 VITALS — BP 101/78 | HR 114

## 2021-07-25 VITALS — BP 102/78 | HR 101

## 2021-07-25 DIAGNOSIS — R41840 Attention and concentration deficit: Secondary | ICD-10-CM | POA: Diagnosis not present

## 2021-07-25 DIAGNOSIS — R296 Repeated falls: Secondary | ICD-10-CM

## 2021-07-25 DIAGNOSIS — H5410 Blindness, one eye, low vision other eye, unspecified eyes: Secondary | ICD-10-CM | POA: Insufficient documentation

## 2021-07-25 DIAGNOSIS — M6281 Muscle weakness (generalized): Secondary | ICD-10-CM

## 2021-07-25 DIAGNOSIS — R2689 Other abnormalities of gait and mobility: Secondary | ICD-10-CM | POA: Diagnosis not present

## 2021-07-25 DIAGNOSIS — R2681 Unsteadiness on feet: Secondary | ICD-10-CM

## 2021-07-25 NOTE — Therapy (Signed)
[facility] [address], [place], [place], [address] Phone: [phone]   Fax:  [phone]  Physical Therapy Treatment  Patient Details  Name: Kayla Harrison MRN: [id number] Date of Birth: 1946-05-08 Referring Provider (PT): Garvin Fila, MD   Encounter Date: 07/25/2021   PT End of Session - 07/25/21 1059     Visit Number 2    Number of Visits 17    Date for PT Re-Evaluation 09/12/21    Authorization Type Medicare & MoO    PT Start Time 6294    PT Stop Time 1057    PT Time Calculation (min) 42 min    Equipment Utilized During Treatment Gait belt    Activity Tolerance Patient tolerated treatment well    Behavior During Therapy WFL for tasks assessed/performed             Past Medical History:  Diagnosis Date   Anemia    Arthritis    osteoarthritis - knees and right shoulder   Blood transfusion without reported diagnosis    Breast cancer ([place])    Dr Margot Chimes, total thyroidectomy- 1999- for cancer   Brucellosis 1964   Chronic bilateral pleural effusions    Colon polyp    Dr Earlean Shawl   Complication of anesthesia    Ketamine produces LSD reaction, bright colored nightmarish experience    Dyslipidemia    Endometriosis    Fibroids    H/O pleural effusion    s/p thoracentesis w 3243ml withdrawn   Hepatitis    [place], ?hepatitis    History of dysphagia    due to radiation therapy   History of hiatal hernia    small noted on PET scan   Hypertension    Hypothyroidism    Lung cancer, lower lobe ([place]) 01/2017   radiation RX completed 03/04/17; will start chemo 6/27, pt unaware of lung cancer   Morbid obesity ([place])    Status post lap band surgery   Nephrolithiasis    Non Hodgkin's lymphoma ([place])    on chemotherapy   Persistent atrial fibrillation ([place])    a. s/p PVI 2008 b. s/p convergent ablation 7654 complicated by bradycardia requiring pacemaker implant   Personal history  of radiation therapy    Presence of permanent cardiac pacemaker    Rotator cuff tear    Right   Stroke ([place])    2003- [place] x2   SVC syndrome    with lung mass and non hodgkins lymphoma   Thyroid cancer ([place]) 2000    Past Surgical History:  Procedure Laterality Date   ABDOMINAL HYSTERECTOMY  1983   afib ablation     a. 2008 PVI b. 2014 convergent ablation   APPENDECTOMY     BONE MARROW BIOPSY  02/21/2017   BREAST LUMPECTOMY Left 2010   [place]     2015- negative   CARDIOVERSION  10/09/2012   Procedure: CARDIOVERSION;  Surgeon: Minus Breeding, MD;  Location: [place];  Service: Cardiovascular;  Laterality: N/A;   CARDIOVERSION  10/09/2012   Procedure: CARDIOVERSION;  Surgeon: Minus Breeding, MD;  Location[facility]COPY;  Service: Cardiovascular;  Laterality: N/A;  [name] gave the ok to add pt to the add on , but we must check to find out if the can add pt on at [phone])   CARDIOVERSION N/A 11/20/2012   Procedure: CARDIOVERSION;  Surgeon: Fay Records, MD;  Location: [place];  Service: Cardiovascular;  Laterality: N/A;   CARDIOVERSION N/A 07/18/2017   Procedure: CARDIOVERSION;  Surgeon: Acie Fredrickson Wonda Cheng, MD;  Location: [place];  Service: Cardiovascular;  Laterality: N/A;   CARDIOVERSION N/A 10/03/2017   Procedure: CARDIOVERSION;  Surgeon: Sanda Klein, MD;  Location: [facility] ENDOSCOPY;  Service: Cardiovascular;  Laterality: N/A;   CARDIOVERSION N/A 01/07/2018   Procedure: CARDIOVERSION;  Surgeon: Thayer Headings, MD;  Location: [facility] ENDOSCOPY;  Service: Cardiovascular;  Laterality: N/A;   CARDIOVERSION N/A 12/10/2019   Procedure: CARDIOVERSION;  Surgeon: Buford Dresser, MD;  Location: [facility] ENDOSCOPY;  Service: Cardiovascular;  Laterality: N/A;   CARDIOVERSION N/A 03/09/2021   Procedure: CARDIOVERSION;  Surgeon: Pixie Casino, MD;  Location: [place];  Service: Cardiovascular;  Laterality: N/A;   CHOLECYSTECTOMY     COLONOSCOPY W/  POLYPECTOMY     Dr Earlean Shawl   CYSTOSCOPY N/A 02/06/2015   Procedure: CYSTOSCOPY;  Surgeon: Kathie Rhodes, MD;  Location: [facility];  Service: Urology;  Laterality: N/A;   CYSTOSCOPY W/ RETROGRADES Left 11/17/2017   Procedure: CYSTOSCOPY WITH RETROGRADE /PYELOGRAM/;  Surgeon: Kathie Rhodes, MD;  Location: [facility];  Service: Urology;  Laterality: Left;   CYSTOSCOPY WITH RETROGRADE PYELOGRAM, URETEROSCOPY AND STENT PLACEMENT Right 02/06/2015   Procedure: RETROGRADE PYELOGRAM, RIGHT URETEROSCOPY STENT PLACEMENT;  Surgeon: Kathie Rhodes, MD;  Location: [facility];  Service: Urology;  Laterality: Right;   CYSTOSCOPY WITH RETROGRADE PYELOGRAM, URETEROSCOPY AND STENT PLACEMENT Right 03/07/2017   Procedure: CYSTOSCOPY WITH RIGHT RETROGRADE PYELOGRAM,RIGHT  URETEROSCOPYLASER LITHOTRIPSY  AND STENT PLACEMENT AND STONE BASKETRY;  Surgeon: Kathie Rhodes, MD;  Location: [place];  Service: Urology;  Laterality: Right;   EYE SURGERY     cataract surgery   fatty mass removal  1999   pubic area   HOLMIUM LASER APPLICATION N/A 5/52/0802   Procedure: HOLMIUM LASER APPLICATION;  Surgeon: Kathie Rhodes, MD;  Location: [facility];  Service: Urology;  Laterality: N/A;   HOLMIUM LASER APPLICATION Right 2/33/6122   Procedure: HOLMIUM LASER APPLICATION;  Surgeon: Kathie Rhodes, MD;  Location: [facility];  Service: Urology;  Laterality: Right;   HOLMIUM LASER APPLICATION Left 4/49/7530   Procedure: HOLMIUM LASER APPLICATION;  Surgeon: Kathie Rhodes, MD;  Location: [facility];  Service: Urology;  Laterality: Left;   I & D EXTREMITY Left 12/19/2020   Procedure: IRRIGATION AND DEBRIDEMENT OF LEFT HIP HEMATOMA WITH APPLICATION OF WOUND VAC;  Surgeon: Erle Crocker, MD;  Location: [place];  Service: Orthopedics;  Laterality: Left;   I & D EXTREMITY Left 02/06/2021   Procedure: IRRIGATION AND DEBRIDEMENT DEEP ABCESS LEFT THIGH, SECONDARY CLOSURE OF WOUND DEHISCENCE;  Surgeon: Erle Crocker, MD;  Location:  [place];  Service: Orthopedics;  Laterality: Left;   IR FLUORO GUIDE PORT INSERTION RIGHT  02/24/2017   IR NEPHROSTOMY PLACEMENT RIGHT  11/17/2017   IR PATIENT EVAL TECH 0-60 MINS  03/11/2017   IR REMOVAL TUN ACCESS W/ PORT W/O FL MOD SED  04/20/2018   IR US GUIDE VASC ACCESS RIGHT  02/24/2017   KNEE ARTHROSCOPY     bilateral   LAPAROSCOPIC GASTRIC BANDING  07/10/2010   LAPAROSCOPIC GASTRIC BANDING     Laparoscopic adjustable banding APS System with posterior hiatal hernia, 2 suture.   LAPAROTOMY     for ruptured ovary and ovarian artery    NEPHROLITHOTOMY Right 11/17/2017   Procedure: NEPHROLITHOTOMY PERCUTANEOUS;  Surgeon: Kathie Rhodes, MD;  Location: [facility];  Service: Urology;  Laterality: Right;  PACEMAKER INSERTION  03/10/2013   MDT dual chamber PPM   POCKET REVISION N/A 12/08/2013   Procedure: POCKET REVISION;  Surgeon: Deboraha Sprang, MD;  Location: [facility] CATH LAB;  Service: Cardiovascular;  Laterality: N/A;   PORTA CATH INSERTION     REVERSE SHOULDER ARTHROPLASTY Right 05/14/2018   Procedure: RIGHT REVERSE SHOULDER ARTHROPLASTY;  Surgeon: Tania Ade, MD;  Location: [place];  Service: Orthopedics;  Laterality: Right;   REVERSE SHOULDER REPLACEMENT Right 05/14/2018   RIGHT HEART CATH N/A 07/21/2019   Procedure: RIGHT HEART CATH;  Surgeon: Larey Dresser, MD;  Location: [place] CV LAB;  Service: Cardiovascular;  Laterality: N/A;   TEE WITH CARDIOVERSION  09/22/2017   TEE WITHOUT CARDIOVERSION N/A 10/03/2017   Procedure: TRANSESOPHAGEAL ECHOCARDIOGRAM (TEE);  Surgeon: Sanda Klein, MD;  Location: [facility] ENDOSCOPY;  Service: Cardiovascular;  Laterality: N/A;   TEE WITHOUT CARDIOVERSION N/A 08/04/2019   Procedure: TRANSESOPHAGEAL ECHOCARDIOGRAM (TEE);  Surgeon: Larey Dresser, MD;  Location: [facility] ENDOSCOPY;  Service: Cardiovascular;  Laterality: N/A;   TEE WITHOUT CARDIOVERSION N/A 12/10/2019   Procedure: TRANSESOPHAGEAL ECHOCARDIOGRAM (TEE);  Surgeon: Buford Dresser, MD;   Location: [facility] ENDOSCOPY;  Service: Cardiovascular;  Laterality: N/A;   TEE WITHOUT CARDIOVERSION N/A 03/09/2021   Procedure: TRANSESOPHAGEAL ECHOCARDIOGRAM (TEE);  Surgeon: Pixie Casino, MD;  Location: [place];  Service: Cardiovascular;  Laterality: N/A;   THYROIDECTOMY  1998   Dr Margot Chimes   TONSILLECTOMY     TOTAL KNEE ARTHROPLASTY  04/13/2012   Procedure: TOTAL KNEE ARTHROPLASTY;  Surgeon: Rudean Haskell, MD;  Location: [place];  Service: Orthopedics;  Laterality: Right;   VIDEO BRONCHOSCOPY WITH ENDOBRONCHIAL ULTRASOUND N/A 02/07/2017   Procedure: VIDEO BRONCHOSCOPY WITH ENDOBRONCHIAL ULTRASOUND;  Surgeon: Marshell Garfinkel, MD;  Location: [place];  Service: Pulmonary;  Laterality: N/A;    Vitals:   07/25/21 1017  BP: 102/78  Pulse: (!) 101     Subjective Assessment - 07/25/21 0938     Subjective DOing okay. Had her BP meds changed yesterday at Cardiologist's office. Had a stitch taken out of the L thigh. Denies questions/concerns on HEP.    Pertinent History anemia, breast CA s/p surgery, lung CA s/p radiation and chemo, lymphoma on chemo, thyroid CA, HLD, hiatal hernia, HTN, a-fib, pacemaker, R reverse TSA 2019, stroke 2003, SVC syndrome, R TKA 2013    Diagnostic tests 03/15/21 head CT: Similar appearance of left caudothalamic hemorrhage with  intraventricular extension and mild adjacent edema. No new  hemorrhage. Unchanged mild trapping of the left lateral ventricle    Patient Stated Goals be able to get up off the floor    Currently in Pain? No/denies                [facility] PT Assessment - 07/25/21 0001       Standardized Balance Assessment   Standardized Balance Assessment Berg Balance Test      Berg Balance Test   Sit to Stand Able to stand without using hands and stabilize independently    Standing Unsupported Able to stand safely 2 minutes    Sitting with Back Unsupported but Feet Supported on Floor or Stool Able to sit safely and securely 2 minutes    Stand to Sit  Sits safely with minimal use of hands    Transfers Able to transfer safely, minor use of hands    Standing Unsupported with Eyes Closed Able to stand 10 seconds with supervision    Standing Unsupported with Feet Together Needs help to  attain position but able to stand for 30 seconds with feet together    From Standing, Reach Forward with Outstretched Arm Can reach forward >12 cm safely (5")    From Standing Position, Pick up Object from [place] to pick up shoe, needs supervision    From Standing Position, Turn to Look Behind Over each Shoulder Needs supervision when turning    Turn 360 Degrees Able to turn 360 degrees safely but slowly    Standing Unsupported, Alternately Place Feet on Step/Stool Needs assistance to keep from falling or unable to try    Standing Unsupported, One Foot in [place] balance while stepping or standing    Standing on One Leg Unable to try or needs assist to prevent fall    Total Score 33                           [facility] Adult PT Treatment/Exercise - 07/25/21 0001       Ambulation/Gait   Ambulation Distance (Feet) 86 Feet    Assistive device Straight cane    Gait Pattern Step-to pattern;Decreased step length - left;Decreased step length - right;Right foot flat;Left foot flat;Right flexed knee in stance;Left flexed knee in stance;Poor foot clearance - left;Poor foot clearance - right;Trunk flexed    Ambulation Surface Level;Indoor    Gait velocity decreased    Gait Comments good SPC sequencing; cueing to increase L step length and heel-toe pattern      Neuro Re-ed    Neuro Re-ed Details  alt R/L toe tap on cone with 1 UE/3 fingers support on II bar 2x10   cues for increased lateral wt shift     Exercises   Exercises Knee/Hip      Knee/Hip Exercises: Stretches   Gastroc Stretch Right;Left;1 rep;30 seconds    Gastroc Stretch Limitations at II bars   corrected foot positioning     Knee/Hip Exercises: Aerobic   Nustep L1 x 6 min  (UEs/LEs)                     PT Education - 07/25/21 1058     Education Details edu on [place]ears test scores and areas for further improvement    Person(s) Educated Patient    Methods Explanation;Demonstration    Comprehension Verbalized understanding              PT Short Term Goals - 07/25/21 1224       PT SHORT TERM GOAL #1   Title Patient to be independent with initial HEP.    Time 3    Period Weeks    Status On-going    Target Date 08/08/21               PT Long Term Goals - 07/25/21 1224       PT LONG TERM GOAL #1   Title Patient to be independent with advanced HEP.    Time 8    Period Weeks    Status On-going      PT LONG TERM GOAL #2   Title Patient to demonstrate B LE strength >/=4+/5.    Time 8    Period Weeks    Status On-going      PT LONG TERM GOAL #3   Title Patient to score at least 46/56 on Berg in order to decrease risk of falls.    Time 8    Period Weeks    Status On-going  PT LONG TERM GOAL #4   Title Patient to complete TUG in <14 sec with LRAD in order to decrease risk of falls.    Time 8    Period Weeks    Status On-going      PT LONG TERM GOAL #5   Title Patient to demonstrate 5xSTS test in <15 sec in order to decrease risk of falls.    Time 8    Period Weeks    Status On-going      PT LONG TERM GOAL #6   Title Patient to demonstrate safe floor transfer with mod I.    Time 8    Period Weeks    Status On-going                   Plan - 07/25/21 1059     Clinical Impression Statement Patient arrived to session with report of having had her BP meds adjusted at yesterday's Cardiology appointment; today BP appeared normal. Patient scored 33/56 on Berg, indicating an increased risk of falls. Most difficulty was demonstrated with SLS and turning activities. Trialed gait training with patient's SPC with patient demonstrating good SPC sequencing but requiring cueing to increase L step length and heel-toe  pattern. Advised patient to continue using 4WW indoors/outdoors until she becomes more steady. Dynamic balance activities required cueing for proper weight shifting d/t imbalance and fear of falling. Patient tolerated session with intermittent rest breaks d/t fatigue. No further complaints at end of session.    Comorbidities anemia, breast CA s/p surgery, lung CA s/p radiation and chemo, lymphoma on chemo, thyroid CA, HLD, hiatal hernia, HTN, a-fib, pacemaker, R reverse TSA 2019, stroke 2003, SVC syndrome, R TKA 2013    PT Treatment/Interventions ADLs/Self Care Home Management;Cryotherapy;Electrical Stimulation;DME Instruction;Moist Heat;Gait training;Stair training;Functional mobility training;Therapeutic activities;Therapeutic exercise;Balance training;Neuromuscular re-education;Manual techniques;Patient/family education;Passive range of motion;Dry needling;Energy conservation;Vestibular;Vasopneumatic Device;Taping    PT Next Visit Plan balance training with SLS activities; progress LE stretching and strengthening to improve posture and gait deviations; continue gait training with SPC    Consulted and Agree with Plan of Care Patient             Patient will benefit from skilled therapeutic intervention in order to improve the following deficits and impairments:  Abnormal gait, Decreased range of motion, Difficulty walking, Increased fascial restricitons, Increased muscle spasms, Decreased safety awareness, Decreased endurance, Cardiopulmonary status limiting activity, Decreased activity tolerance, Decreased balance, Impaired flexibility, Improper body mechanics, Postural dysfunction, Decreased strength  Visit Diagnosis: Unsteadiness on feet  Repeated falls  Muscle weakness (generalized)  Other abnormalities of gait and mobility     Problem List Patient Active Problem List   Diagnosis Date Noted   Hypertension    Pressure injury of skin 03/15/2021   Intracranial hemorrhage ([place])  03/14/2021   Lower extremity edema    Polymicrobial bacterial infection    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    Enterococcus faecalis infection    Sepsis due to Escherichia coli ([place])    Debility 02/13/2021   Infection of wound hematoma 02/05/2021   Physical debility 12/22/2020   Tear of left hamstring 12/22/2020   Labral tear of left hip joint 12/22/2020   Acute blood loss anemia    Orthostatic hypotension 12/17/2020   History of CVA (cerebrovascular accident) 12/17/2020   Fall 12/15/2020   Hip hematoma, left, initial encounter 12/15/2020   Nondisplaced fracture of coronoid process of left ulna, initial encounter for closed fracture 12/15/2020   Multiple rib fractures 12/15/2020  COVID-19 virus infection 10/12/2020   Constipation 06/22/2020   Change in bowel habits 06/22/2020   Diverticulosis 06/22/2020   Dark stools 06/22/2020   Hemorrhoids 06/22/2020   Pacemaker 01/23/2020   Junctional rhythm 01/23/2020   Palpitations 01/23/2020   ICH (intracerebral hemorrhage) ([place]) 12/02/2019   A-fib ([place]) 12/02/2019   Anemia 12/02/2019   QT prolongation 12/02/2019   Hypothyroidism 12/02/2019   Gait abnormality 07/10/2018   S/P reverse total shoulder arthroplasty, 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   Persistent atrial fibrillation ([place])    Bilateral ureteral calculi 11/17/2017   Atrial fibrillation with RVR ([place]) 09/15/2017   Atrial flutter ([place]) 07/17/2017   Port catheter in place 04/02/2017   Non-Hodgkin lymphoma, unspecified, intrathoracic lymph nodes ([place]) 02/13/2017   Mediastinal mass 02/06/2017   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   Abnormal chest x-ray    Lung mass 02/03/2017   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   OSA (obstructive sleep apnea) 02/03/2015   History of renal calculi 11/16/2014   Renal calculi 11/16/2014   Family history of colon cancer 10/26/2014   Mechanical complication due to cardiac pacemaker pulse generator 11/06/2013   Dyspnea 05/12/2013    Hypothyroidism 04/21/2013   Pleural effusion 04/19/2013   Long term (current) use of anticoagulants 12/20/2010   EPIDERMOID CYST 08/22/2010   Chronic diastolic heart failure ([place]) 08/06/2010   SYNCOPE AND COLLAPSE 07/27/2010   OSTEOARTHRITIS, KNEE, RIGHT 03/15/2010   Class 1 obesity due to excess calories with body mass index (BMI) of 33.0 to 33.9 in adult 12/07/2009   NONSPEC ELEVATION OF LEVELS OF TRANSAMINASE/LDH 09/08/2009   BREAST CANCER, HX OF 07/25/2009   COLONIC POLYPS, HX OF 07/25/2009   TUBULOVILLOUS ADENOMA, COLON 04/29/2008   HYPERGLYCEMIA, FASTING 04/29/2008   HYPERLIPIDEMIA 06/22/2007   CARCINOMA, THYROID GLAND, HX OF 06/22/2007    Janene Harvey, PT, DPT 07/25/21 12:26 PM   [facility] [address], [place], [place], [address] Phone: [phone]   Fax:  [phone]  Name: Kayla Harrison MRN: [id number] Date of Birth: [birth year]

## 2021-07-25 NOTE — Therapy (Signed)
[facility] [address] [place], [place], [place], [address] Phone: [phone]   Fax:  [phone]  Occupational Therapy Treatment  Patient Details  Name: Kayla Harrison MRN: [id number] Date of Birth: [birth year] Referring Provider (OT): Leonie Man   Encounter Date: 07/25/2076   OT End of Session - 07/25/21 0943     Visit Number 2    Number of Visits 17    Date for OT Re-Evaluation 09/12/21    Authorization Type Medicare A and B    OT Start Time 0930    OT Stop Time 1572    OT Time Calculation (min) 44 min    Activity Tolerance Patient tolerated treatment well    Behavior During Therapy WFL for tasks assessed/performed             Past Medical History:  Diagnosis Date   Anemia    Arthritis    osteoarthritis - knees and right shoulder   Blood transfusion without reported diagnosis    Breast cancer ([place])    Dr Margot Chimes, total thyroidectomy- 1999- for cancer   Brucellosis 1964   Chronic bilateral pleural effusions    Colon polyp    Dr Earlean Shawl   Complication of anesthesia    Ketamine produces LSD reaction, bright colored nightmarish experience    Dyslipidemia    Endometriosis    Fibroids    H/O pleural effusion    s/p thoracentesis w 3275m withdrawn   Hepatitis    [place], ?hepatitis    History of dysphagia    due to radiation therapy   History of hiatal hernia    small noted on PET scan   Hypertension    Hypothyroidism    Lung cancer, lower lobe ([place] 01[place]   radiation RX completed 03/04/17; will start chemo 6/27, pt unaware of lung cancer   Morbid obesity ([place]    Status post lap band surgery   Nephrolithiasis    Non Hodgkin's lymphoma ([place]    on chemotherapy   Persistent atrial fibrillation ([place]    a. s/p PVI 2008 b. s/p convergent ablation 26203complicated by bradycardia requiring pacemaker implant   Personal history of radiation therapy    Presence of permanent cardiac  pacemaker    Rotator cuff tear    Right   [place]   SVC syndrome    with lung mass and non hodgkins lymphoma   [place] cancer [place]    Past Surgical History:  Procedure Laterality Date   ABDOMINAL HYSTERECTOMY  1983   afib ablation     a. 2008 PVI b. 2014 convergent ablation   APPENDECTOMY     BONE MARROW BIOPSY  02/21/2017   BREAST LUMPECTOMY Left 2010   [place]    2015- negative   CARDIOVERSION  10/09/2012   Procedure: CARDIOVERSION;  Surgeon: JMinus Breeding MD;  [facility]  Service: Cardiovascular;  Laterality: N/A;   CARDIOVERSION  10/09/2012   Procedure: CARDIOVERSION;  Surgeon: JMinus Breeding MD;  [facility]  Service: Cardiovascular;  Laterality: N/A;  [name] the ok to add pt to the add on , but we must check to find out if the can add pt on at [phone] ( [phone])   CARDIOVERSION N/A 11/20/2012   Procedure: CARDIOVERSION;  Surgeon: PFay Records MD;  [facility]  Service: Cardiovascular;  Laterality: N/A;  CARDIOVERSION N/A 07/18/2017   Procedure: CARDIOVERSION;  Surgeon: Acie Fredrickson, Wonda Cheng, MD;  Location: [place];  Service: Cardiovascular;  Laterality: N/A;   CARDIOVERSION N/A 10/03/2017   Procedure: CARDIOVERSION;  Surgeon: Sanda Klein, MD;  [facility];  Service: Cardiovascular;  Laterality: N/A;   CARDIOVERSION N/A 01/07/2018   Procedure: CARDIOVERSION;  Surgeon: Acie Fredrickson Wonda Cheng, MD;  Location[facility] END[facility]COPY;  Service: Cardiovascular;  Laterality: N/A;   CARDIOVERSION N/A 12/10/2019   Procedure: CARDIOVERSION;  Surgeon: Buford Dresser, MD;  Location: [facility] ENDOSCOPY;  Service: Cardiovascular;  Laterality: N/A;   CARDIOVERSION N/A 03/09/2021   Procedure: CARDIOVERSION;  Surgeon: Pixie Casino, MD;  Location: [place];  Service: Cardiovascular;  Laterality: N/A;   CHOLECYSTECTOMY     COLONOSCOPY W/ POLYPECTOMY     Dr Earlean Shawl   CYSTOSCOPY N/A 02/06/2015    Procedure: CYSTOSCOPY;  Surgeon: Kathie Rhodes, MD;  [facility];  Service: Urology;  Laterality: N/A;   CYSTOSCOPY W/ RETROGRADES Left 11/17/2017   Procedure: CYSTOSCOPY WITH RETROGRADE /PYELOGRAM/;  Surgeon: Kathie Rhodes, MD;  [facility];  Service: Urology;  Laterality: Left;   CYSTOSCOPY WITH RETROGRADE PYELOGRAM, URETEROSCOPY AND STENT PLACEMENT Right 02/06/2015   Procedure: RETROGRADE PYELOGRAM, RIGHT URETEROSCOPY STENT PLACEMENT;  Surgeon: Kathie Rhodes, MD;  [facility];  Service: Urology;  Laterality: Right;   CYSTOSCOPY WITH RETROGRADE PYELOGRAM, URETEROSCOPY AND STENT PLACEMENT Right 03/07/2017   Procedure: CYSTOSCOPY WITH RIGHT RETROGRADE PYELOGRAM,RIGHT  URETEROSCOPYLASER LITHOTRIPSY  AND STENT PLACEMENT AND STONE BASKETRY;  Surgeon: Kathie Rhodes, MD;  Location: [place];  Service: Urology;  Laterality: Right;   EYE SURGERY     cataract surgery   fatty mass removal  1999   pubic area   HOLMIUM LASER APPLICATION N/A 7/89/3810   Procedure: HOLMIUM LASER APPLICATION;  Surgeon: Kathie Rhodes, MD;  [facility];  Service: Urology;  Laterality: N/A;   HOLMIUM LASER APPLICATION Right 1/75/1025   Procedure: HOLMIUM LASER APPLICATION;  Surgeon: Kathie Rhodes, MD;  Location: [facility];  Service: Urology;  Laterality: Right;   HOLMIUM LASER APPLICATION Left 8/52/7782   Procedure: HOLMIUM LASER APPLICATION;  Surgeon: Kathie Rhodes, MD;  [facility];  Service: Urology;  Laterality: Left;   I & D EXTREMITY Left 12/19/2020   Procedure: IRRIGATION AND DEBRIDEMENT OF LEFT HIP HEMATOMA WITH APPLICATION OF WOUND VAC;  Surgeon: Erle Crocker, MD;  Location: [place];  Service: Orthopedics;  Laterality: Left;   I & D EXTREMITY Left 02/06/2021   Procedure: IRRIGATION AND DEBRIDEMENT DEEP ABCESS LEFT THIGH, SECONDARY CLOSURE OF WOUND DEHISCENCE;  Surgeon: Erle Crocker, MD;  Location: [place];  Service: Orthopedics;  Laterality: Left;   IR  FLUORO GUIDE PORT INSERTION RIGHT  02/24/2017   IR NEPHROSTOMY PLACEMENT RIGHT  11/17/2017   IR PATIENT EVAL TECH 0-60 MINS  03/11/2017   IR REMOVAL TUN ACCESS W/ PORT W/O FL MOD SED  04/20/2018   IR US GUIDE VASC ACCESS RIGHT  02/24/2017   KNEE ARTHROSCOPY     bilateral   LAPAROSCOPIC GASTRIC BANDING  07/10/2010   LAPAROSCOPIC GASTRIC BANDING     Laparoscopic adjustable banding APS System with posterior hiatal hernia, 2 suture.   LAPAROTOMY     for ruptured ovary and ovarian artery    NEPHROLITHOTOMY Right 11/17/2017   Procedure: NEPHROLITHOTOMY PERCUTANEOUS;  Surgeon: Kathie Rhodes, MD;  [facility];  Service: Urology;  Laterality: Right;   PACEMAKER INSERTION  03/10/2013   MDT dual chamber PPM  POCKET REVISION N/A 12/08/2013   Procedure: POCKET REVISION;  Surgeon: Deboraha Sprang, MD;  Location: [facility] C[facility] LAB;  Service: Cardiovascular;  Laterality: N/A;   PORTA CATH INSERTION     REVERSE SHOULDER ARTHROPLASTY Right 05/14/2018   Procedure: RIGHT REVERSE SHOULDER ARTHROPLASTY;  Surgeon: Tania Ade, MD;  Location: [place];  Service: Orthopedics;  Laterality: Right;   REVERSE SHOULDER REPLACEMENT Right 05/14/2018   RIGHT HEART CATH N/A 07/21/2019   Procedure: RIGHT HEART CATH;  Surgeon: Larey Dresser, MD;  Location: [place] CV LAB;  Service: Cardiovascular;  Laterality: N/A;   TEE WITH CARDIOVERSION  09/22/2017   TEE WITHOUT CARDIOVERSION N/A 10/03/2017   Procedure: TRANSESOPHAGEAL ECHOCARDIOGRAM (TEE);  Surgeon: Sanda Klein, MD;  Location: [facility] ENDOSCOPY;  Service: Cardiovascular;  Laterality: N/A;   TEE WITHOUT CARDIOVERSION N/A 08/04/2019   Procedure: TRANSESOPHAGEAL ECHOCARDIOGRAM (TEE);  Surgeon: Larey Dresser, MD;  Location: [facility] ENDOSCOPY;  Service: Cardiovascular;  Laterality: N/A;   TEE WITHOUT CARDIOVERSION N/A 12/10/2019   Procedure: TRANSESOPHAGEAL ECHOCARDIOGRAM (TEE);  Surgeon: Buford Dresser, MD;  Location: [facility] ENDOSCOPY;  Service: Cardiovascular;  Laterality:  N/A;   TEE WITHOUT CARDIOVERSION N/A 03/09/2021   Procedure: TRANSESOPHAGEAL ECHOCARDIOGRAM (TEE);  Surgeon: Pixie Casino, MD;  Location: [place];  Service: Cardiovascular;  Laterality: N/A;   THYROIDECTOMY  1998   Dr Margot Chimes   TONSILLECTOMY     TOTAL KNEE ARTHROPLASTY  04/13/2012   Procedure: TOTAL KNEE ARTHROPLASTY;  Surgeon: Rudean Haskell, MD;  Location: [place];  Service: Orthopedics;  Laterality: Right;   VIDEO BRONCHOSCOPY WITH ENDOBRONCHIAL ULTRASOUND N/A 02/07/2017   Procedure: VIDEO BRONCHOSCOPY WITH ENDOBRONCHIAL ULTRASOUND;  Surgeon: Marshell Garfinkel, MD;  Location: [place];  Service: Pulmonary;  Laterality: N/A;    Vitals:   07/25/21 0943  BP: 101/78  Pulse: (!) 114     Subjective Assessment - 07/25/21 0934     Subjective  Pt reports feeling tired this morning after having 2 long appointments yesterday.    Pertinent History h/o hyperlipidemia, atrial fibrillation with pacemaker on Eliquis, lymphoma, confusion, subcortical hemorrhage on the left with intraventricular extension    Currently in Pain? No/denies               Treatmet session with focus on dynamic standing balance and endurance with functional reach. Utilized resistive clothespins (1, 2, 4, 6#) while standing and reaching across midline and outside BOS to mid and high reach to simulate home making tasks while focusing on grip and pinch strength.  Engaged in reaching in to high and low cabinets to retreive items while focusing on endurance and balance.  Pt with reports of mild lightheadedness after reaching tasks and post standing ~5 mins. Provided rest break and BP assessed.  Pt reports difficulty with laundry tasks due to having to reach from low to high.  Therapist discussed possible alternatives to setup and routine with laundry to allow reach from low to mid then to high.  Pt reports plan to assess setup in home and report back at next session.  Therapist issued theraputty and directed pt in HEP with  focus on bilateral hand coordination and strength.  Pt reports decreased strength in L hand this session, reporting that it fluctuates between hands.  Provided pt with printed HEP.                     OT Short Term Goals - 07/25/21 1022       OT SHORT TERM GOAL #1  Title Pt will verbalize understanding of 3 compensatory strategies for low vision.    Time 4    Period Weeks    Status On-going    Target Date 08/15/21      OT SHORT TERM GOAL #2   Title Pt will improve hand strength and endurance to improve handwriting legiblity to 90%    Time 4    Period Weeks    Status On-going      OT SHORT TERM GOAL #3   Title Pt will demonstrate improved activity tolerance to complete standing acitivity for 15 mins without seated rest break.    Time 4    Period Weeks    Status On-going      OT SHORT TERM GOAL #4   Title Pt will improve shoulder ROM to 140* in RUE to increase functional reach in to cabinets    Time 4    Period Weeks    Status On-going               OT Long Term Goals - 07/25/21 1022       OT LONG TERM GOAL #1   Title Pt will be independent in energy conservation strategies.    Time 8    Period Weeks    Status On-going      OT LONG TERM GOAL #2   Title Pt will be independent in modifications for low vision.    Time 8    Period Weeks    Status On-going      OT LONG TERM GOAL #3   Title Pt wil be able to complete housekeeping task incorporating reaching outside BOS without reports of dizziness.    Time 8    Period Weeks    Status On-going      OT LONG TERM GOAL #4   Title Pt will be able to complete dual task activity without cues for sequencing and with improved safety awareness.    Time 8    Period Weeks    Status On-going                   Plan - 07/25/21 1023     Clinical Impression Statement Pt is a 75 y/o female who presents to OP OT due to nontraumatic subcortical hemorrhage of left cerebral hemisphere. Pt currently  lives alone in a one level condo and is retired prior to onset. PMHx includes hyperlipidemia, atrial fibrillation with pacemaker on Eliquis, lymphoma, confusion. Pt will benefit from skilled occupational therapy services to address strength and endurance, ROM, balance, fine motor control, safety awareness, introduction of compensatory strategies/AE prn, energy conservation, vision, and implementation of an HEP to improve participation and safety during ADLs and IADLs.    OT Occupational Profile and History Detailed Assessment- Review of Records and additional review of physical, cognitive, psychosocial history related to current functional performance    Occupational performance deficits (Please refer to evaluation for details): ADL's;IADL's;Leisure;Social Participation    Body Structure / Function / Physical Skills ADL;Balance;Body mechanics;Cardiopulmonary status limiting activity;Coordination;Endurance;FMC;GMC;IADL;Mobility;Pain;ROM;Proprioception;Sensation;Strength;UE functional use;Vision    Psychosocial Skills Environmental  Adaptations;Routines and Behaviors    Rehab Potential Good    Clinical Decision Making Limited treatment options, no task modification necessary    Comorbidities Affecting Occupational Performance: May have comorbidities impacting occupational performance    Modification or Assistance to Complete Evaluation  No modification of tasks or assist necessary to complete eval    OT Frequency 2x / week    OT Duration 8 weeks  OT Treatment/Interventions Self-care/ADL training;Cryotherapy;Electrical Stimulation;Ultrasound;Moist Heat;Fluidtherapy;Therapeutic exercise;Neuromuscular education;Energy conservation;Manual Therapy;Functional Mobility Training;DME and/or AE instruction;Passive range of motion;Patient/family education;Visual/perceptual remediation/compensation;Cognitive remediation/compensation;Therapeutic activities;Balance training;Psychosocial skills training;Coping  strategies training    Plan Vision handout for low vision and explore accommodations.  Focus on activity tolerance and reaching outside BOS - particularly low to mid to high reach for laundry tasks    OT Home Exercise Plan theraputty exercises    Consulted and Agree with Plan of Care Patient             Patient will benefit from skilled therapeutic intervention in order to improve the following deficits and impairments:   Body Structure / Function / Physical Skills: ADL, Balance, Body mechanics, Cardiopulmonary status limiting activity, Coordination, Endurance, FMC, GMC, IADL, Mobility, Pain, ROM, Proprioception, Sensation, Strength, UE functional use, Vision   Psychosocial Skills: Environmental  Adaptations, Routines and Behaviors   Visit Diagnosis: Unsteadiness on feet  Muscle weakness (generalized)  Attention and concentration deficit  Blindness, one eye, low vision other eye, unspecified eyes    Problem List Patient Active Problem List   Diagnosis Date Noted   Hypertension    Pressure injury of skin 03/15/2021   Intracranial hemorrhage ([place]) 03/14/2021   Lower extremity edema    Polymicrobial bacterial infection    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    Enterococcus faecalis infection    Sepsis due to Escherichia coli ([place])    Debility 02/13/2021   Infection of wound hematoma 02/05/2021   Physical debility 12/22/2020   Tear of left hamstring 12/22/2020   Labral tear of left hip joint 12/22/2020   Acute blood loss anemia    Orthostatic hypotension 12/17/2020   History of CVA (cerebrovascular accident) 12/17/2020   Fall 12/15/2020   Hip hematoma, left, initial encounter 12/15/2020   Nondisplaced fracture of coronoid process of left ulna, initial encounter for closed fracture 12/15/2020   Multiple rib fractures 12/15/2020   COVID-19 virus infection 10/12/2020   Constipation 06/22/2020   Change in bowel habits 06/22/2020   Diverticulosis 06/22/2020   Dark  stools 06/22/2020   Hemorrhoids 06/22/2020   Pacemaker 01/23/2020   Junctional rhythm 01/23/2020   Palpitations 01/23/2020   ICH (intracerebral hemorrhage) ([place]) 12/02/2019   A-fib ([place]) 12/02/2019   Anemia 12/02/2019   QT prolongation 12/02/2019   Hypothyroidism 12/02/2019   Gait abnormality 07/10/2018   S/P reverse total shoulder arthroplasty, 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   Persistent atrial fibrillation ([place])    Bilateral ureteral calculi 11/17/2017   Atrial fibrillation with RVR ([place]) 09/15/2017   Atrial flutter ([place]) 07/17/2017   Port catheter in place 04/02/2017   Non-Hodgkin lymphoma, unspecified, intrathoracic lymph nodes ([place]) 02/13/2017   Mediastinal mass 02/06/2017   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[place]) 02/06/2017   Abnormal chest x-ray    Lung mass 02/03/2017   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   OSA (obstructive sleep apnea) 02/03/2015   History of renal calculi 11/16/2014   Renal calculi 11/16/2014   Family history of colon cancer 10/26/2014   Mechanical complication due to cardiac pacemaker pulse generator 11/06/2013   Dyspnea 05/12/2013   Hypothyroidism 04/21/2013   Pleural effusion 04/19/2013   Long term (current) use of anticoagulants 12/20/2010   EPIDERMOID CYST 08/22/2010   Chronic diastolic heart failure ([place]) 08/06/2010   SYNCOPE AND COLLAPSE 07/27/2010   OSTEOARTHRITIS, KNEE, RIGHT 03/15/2010   Class 1 obesity due to excess calories with body mass index (BMI) of 33.0 to 33.9 in adult 12/07/2009   NONSPEC ELEVATION OF LEVELS OF TRANSAMINASE/LDH  09/08/2009   BREAST CANCER, HX OF 07/25/2009   COLONIC POLYPS, HX OF 07/25/2009   TUBULOVILLOUS ADENOMA, COLON 04/29/2008   HYPERGLYCEMIA, FASTING 04/29/2008   HYPERLIPIDEMIA 06/22/2007   CARCINOMA, THYROID GLAND, HX OF 06/22/2007    Simonne Come, OT/L 07/25/2021, 10:39 AM  [facility] [address], [place], [place], [address] Phone: [phone]   Fax:   [phone]  Name: MIKINZIE MACIEJEWSKI MRN: [id number] Date of Birth: [birth year]

## 2021-07-27 ENCOUNTER — Encounter: Payer: Self-pay | Admitting: Physical Therapy

## 2021-07-27 ENCOUNTER — Ambulatory Visit: Payer: Medicare Other | Admitting: Physical Therapy

## 2021-07-27 ENCOUNTER — Encounter: Payer: Self-pay | Admitting: Occupational Therapy

## 2021-07-27 ENCOUNTER — Other Ambulatory Visit: Payer: Self-pay

## 2021-07-27 ENCOUNTER — Ambulatory Visit: Payer: Medicare Other | Admitting: Occupational Therapy

## 2021-07-27 VITALS — BP 119/92 | HR 104

## 2021-07-27 DIAGNOSIS — M6281 Muscle weakness (generalized): Secondary | ICD-10-CM | POA: Diagnosis not present

## 2021-07-27 DIAGNOSIS — R2681 Unsteadiness on feet: Secondary | ICD-10-CM

## 2021-07-27 DIAGNOSIS — R296 Repeated falls: Secondary | ICD-10-CM

## 2021-07-27 DIAGNOSIS — R2689 Other abnormalities of gait and mobility: Secondary | ICD-10-CM

## 2021-07-27 DIAGNOSIS — H5410 Blindness, one eye, low vision other eye, unspecified eyes: Secondary | ICD-10-CM | POA: Diagnosis not present

## 2021-07-27 DIAGNOSIS — R41840 Attention and concentration deficit: Secondary | ICD-10-CM | POA: Diagnosis not present

## 2021-07-27 NOTE — Therapy (Signed)
[facility] [address], [place], [place], [address] Phone: [phone]   Fax:  [phone]  Physical Therapy Treatment  Patient Details  Name: Kayla Harrison MRN: [id number] Date of Birth: 06-Jun-1946 Referring Provider (PT): Garvin Fila, MD   Encounter Date: 07/27/2021   PT End of Session - 07/27/21 1009     Visit Number 3    Number of Visits 17    Date for PT Re-Evaluation 09/12/21    Authorization Type Medicare & MoO    PT Start Time 0922    PT Stop Time 1008    PT Time Calculation (min) 46 min    Equipment Utilized During Treatment Gait belt    Activity Tolerance Patient tolerated treatment well    Behavior During Therapy WFL for tasks assessed/performed             Past Medical History:  Diagnosis Date   Anemia    Arthritis    osteoarthritis - knees and right shoulder   Blood transfusion without reported diagnosis    Breast cancer ([place])    Dr Margot Chimes, total thyroidectomy- 1999- for cancer   Brucellosis 1964   Chronic bilateral pleural effusions    Colon polyp    Dr Earlean Shawl   Complication of anesthesia    Ketamine produces LSD reaction, bright colored nightmarish experience    Dyslipidemia    Endometriosis    Fibroids    H/O pleural effusion    s/p thoracentesis w 3281ml withdrawn   Hepatitis    [place] ?hepatitis    History of dysphagia    due to radiation therapy   History of hiatal hernia    small noted on PET scan   Hypertension    Hypothyroidism    Lung cancer, lower lobe ([place]) 01/2017   radiation RX completed 03/04/17; will start chemo 6/27, pt unaware of lung cancer   Morbid obesity ([place])    Status post lap band surgery   Nephrolithiasis    Non Hodgkin's lymphoma ([place])    on chemotherapy   Persistent atrial fibrillation ([place])    a. s/p PVI 2008 b. s/p convergent ablation 3435 complicated by bradycardia requiring pacemaker implant   Personal history  of radiation therapy    Presence of permanent cardiac pacemaker    Rotator cuff tear    Right   Stroke ([place])    2003- [place] x2   SVC syndrome    with lung mass and non hodgkins lymphoma   Thyroid cancer ([place]) 2000    Past Surgical History:  Procedure Laterality Date   ABDOMINAL HYSTERECTOMY  1983   afib ablation     a. 2008 PVI b. 2014 convergent ablation   APPENDECTOMY     BONE MARROW BIOPSY  02/21/2017   BREAST LUMPECTOMY Left 2010   [place]     2015- negative   CARDIOVERSION  10/09/2012   Procedure: CARDIOVERSION;  Surgeon: Minus Breeding, MD;  Location: [place];  Service: Cardiovascular;  Laterality: N/A;   CARDIOVERSION  10/09/2012   Procedure: CARDIOVERSION;  Surgeon: Minus Breeding, MD;  Location: [facility] ENDOSCOPY;  Service: Cardiovascular;  Laterality: N/A;  [name] gave the ok to add pt to the add on , but we must check to find out if the can add pt on at 1400 [phone])   CARDIOVERSION N/A 11/20/2012   Procedure: CARDIOVERSION;  Surgeon: Fay Records, MD;  Location: [facility] ENDOSCOPY;  Service: Cardiovascular;  Laterality: N/A;   CARDIOVERSION N/A 07/18/2017   Procedure: CARDIOVERSION;  Surgeon: Elease Hashimoto Deloris Ping, MD;  Location: [facility] ENDOSCOPY;  Service: Cardiovascular;  Laterality: N/A;   CARDIOVERSION N/A 10/03/2017   Procedure: CARDIOVERSION;  Surgeon: Thurmon Fair, MD;  Location: [facility] ENDOSCOPY;  Service: Cardiovascular;  Laterality: N/A;   CARDIOVERSION N/A 01/07/2018   Procedure: CARDIOVERSION;  Surgeon: Vesta Mixer, MD;  Location: [facility] ENDOSCOPY;  Service: Cardiovascular;  Laterality: N/A;   CARDIOVERSION N/A 12/10/2019   Procedure: CARDIOVERSION;  Surgeon: Jodelle Red, MD;  Location: [facility] ENDOSCOPY;  Service: Cardiovascular;  Laterality: N/A;   CARDIOVERSION N/A 03/09/2021   Procedure: CARDIOVERSION;  Surgeon: Chrystie Nose, MD;  Location: [facility] ENDOSCOPY;  Service: Cardiovascular;  Laterality: N/A;   CHOLECYSTECTOMY     COLONOSCOPY W/  POLYPECTOMY     Dr Kinnie Scales   CYSTOSCOPY N/A 02/06/2015   Procedure: CYSTOSCOPY;  Surgeon: Ihor Gully, MD;  Location: [facility] ORS;  Service: Urology;  Laterality: N/A;   CYSTOSCOPY W/ RETROGRADES Left 11/17/2017   Procedure: CYSTOSCOPY WITH RETROGRADE /PYELOGRAM/;  Surgeon: Ihor Gully, MD;  Location: [facility];  Service: Urology;  Laterality: Left;   CYSTOSCOPY WITH RETROGRADE PYELOGRAM, URETEROSCOPY AND STENT PLACEMENT Right 02/06/2015   Procedure: RETROGRADE PYELOGRAM, RIGHT URETEROSCOPY STENT PLACEMENT;  Surgeon: Ihor Gully, MD;  Location: [facility];  Service: Urology;  Laterality: Right;   CYSTOSCOPY WITH RETROGRADE PYELOGRAM, URETEROSCOPY AND STENT PLACEMENT Right 03/07/2017   Procedure: CYSTOSCOPY WITH RIGHT RETROGRADE PYELOGRAM,RIGHT  URETEROSCOPYLASER LITHOTRIPSY  AND STENT PLACEMENT AND STONE BASKETRY;  Surgeon: Ihor Gully, MD;  Location: [facility];  Service: Urology;  Laterality: Right;   EYE SURGERY     cataract surgery   fatty mass removal  1999   pubic area   HOLMIUM LASER APPLICATION N/A 02/06/2015   Procedure: HOLMIUM LASER APPLICATION;  Surgeon: Ihor Gully, MD;  Location: [facility];  Service: Urology;  Laterality: N/A;   HOLMIUM LASER APPLICATION Right 03/07/2017   Procedure: HOLMIUM LASER APPLICATION;  Surgeon: Ihor Gully, MD;  Location: [facility];  Service: Urology;  Laterality: Right;   HOLMIUM LASER APPLICATION Left 11/17/2017   Procedure: HOLMIUM LASER APPLICATION;  Surgeon: Ihor Gully, MD;  Location: [facility];  Service: Urology;  Laterality: Left;   I & D EXTREMITY Left 12/19/2020   Procedure: IRRIGATION AND DEBRIDEMENT OF LEFT HIP HEMATOMA WITH APPLICATION OF WOUND VAC;  Surgeon: Terance Hart, MD;  Location: [facility] OR;  Service: Orthopedics;  Laterality: Left;   I & D EXTREMITY Left 02/06/2021   Procedure: IRRIGATION AND DEBRIDEMENT DEEP ABCESS LEFT THIGH, SECONDARY CLOSURE OF WOUND DEHISCENCE;  Surgeon: Terance Hart, MD;  Location:  [facility] OR;  Service: Orthopedics;  Laterality: Left;   IR FLUORO GUIDE PORT INSERTION RIGHT  02/24/2017   IR NEPHROSTOMY PLACEMENT RIGHT  11/17/2017   IR PATIENT EVAL TECH 0-60 MINS  03/11/2017   IR REMOVAL TUN ACCESS W/ PORT W/O FL MOD SED  04/20/2018   IR US GUIDE VASC ACCESS RIGHT  02/24/2017   KNEE ARTHROSCOPY     bilateral   LAPAROSCOPIC GASTRIC BANDING  07/10/2010   LAPAROSCOPIC GASTRIC BANDING     Laparoscopic adjustable banding APS System with posterior hiatal hernia, 2 suture.   LAPAROTOMY     for ruptured ovary and ovarian artery    NEPHROLITHOTOMY Right 11/17/2017   Procedure: NEPHROLITHOTOMY PERCUTANEOUS;  Surgeon: Ihor Gully, MD;  Location: [facility]S;  Service: Urology;  Laterality: Right;  PACEMAKER INSERTION  03/10/2013   MDT dual chamber PPM   POCKET REVISION N/A 12/08/2013   Procedure: POCKET REVISION;  Surgeon: Deboraha Sprang, MD;  Location: [facility] CATH LAB;  Service: Cardiovascular;  Laterality: N/A;   PORTA CATH INSERTION     REVERSE SHOULDER ARTHROPLASTY Right 05/14/2018   Procedure: RIGHT REVERSE SHOULDER ARTHROPLASTY;  Surgeon: Tania Ade, MD;  Location: [place];  Service: Orthopedics;  Laterality: Right;   REVERSE SHOULDER REPLACEMENT Right 05/14/2018   RIGHT HEART CATH N/A 07/21/2019   Procedure: RIGHT HEART CATH;  Surgeon: Larey Dresser, MD;  Location: [place] CV LAB;  Service: Cardiovascular;  Laterality: N/A;   TEE WITH CARDIOVERSION  09/22/2017   TEE WITHOUT CARDIOVERSION N/A 10/03/2017   Procedure: TRANSESOPHAGEAL ECHOCARDIOGRAM (TEE);  Surgeon: Sanda Klein, MD;  Location: [facility] ENDOSCOPY;  Service: Cardiovascular;  Laterality: N/A;   TEE WITHOUT CARDIOVERSION N/A 08/04/2019   Procedure: TRANSESOPHAGEAL ECHOCARDIOGRAM (TEE);  Surgeon: Larey Dresser, MD;  Location: [facility] ENDOSCOPY;  Service: Cardiovascular;  Laterality: N/A;   TEE WITHOUT CARDIOVERSION N/A 12/10/2019   Procedure: TRANSESOPHAGEAL ECHOCARDIOGRAM (TEE);  Surgeon: Buford Dresser, MD;   Location: [facility] ENDOSCOPY;  Service: Cardiovascular;  Laterality: N/A;   TEE WITHOUT CARDIOVERSION N/A 03/09/2021   Procedure: TRANSESOPHAGEAL ECHOCARDIOGRAM (TEE);  Surgeon: Pixie Casino, MD;  Location: [place];  Service: Cardiovascular;  Laterality: N/A;   THYROIDECTOMY  1998   Dr Margot Chimes   TONSILLECTOMY     TOTAL KNEE ARTHROPLASTY  04/13/2012   Procedure: TOTAL KNEE ARTHROPLASTY;  Surgeon: Rudean Haskell, MD;  Location: [place];  Service: Orthopedics;  Laterality: Right;   VIDEO BRONCHOSCOPY WITH ENDOBRONCHIAL ULTRASOUND N/A 02/07/2017   Procedure: VIDEO BRONCHOSCOPY WITH ENDOBRONCHIAL ULTRASOUND;  Surgeon: Marshell Garfinkel, MD;  Location: [place];  Service: Pulmonary;  Laterality: N/A;    Vitals:   07/27/21 0923  BP: (!) 119/92  Pulse: (!) 104     Subjective Assessment - 07/27/21 0925     Subjective Doing pretty good. Has been using her SPC indoors. Having problems with itching around her waist.    Pertinent History anemia, breast CA s/p surgery, lung CA s/p radiation and chemo, lymphoma on chemo, thyroid CA, HLD, hiatal hernia, HTN, a-fib, pacemaker, R reverse TSA 2019, stroke 2003, SVC syndrome, R TKA 2013    Diagnostic tests 03/15/21 head CT: Similar appearance of left caudothalamic hemorrhage with  intraventricular extension and mild adjacent edema. No new  hemorrhage. Unchanged mild trapping of the left lateral ventricle    Patient Stated Goals be able to get up off the floor    Currently in Pain? No/denies                               [facility] Adult PT Treatment/Exercise - 07/27/21 0001       Ambulation/Gait   Ambulation Distance (Feet) 150 Feet   slow speed, worse with fatigue. c/o mild dizziness with turning   Assistive device Straight cane    Gait Pattern Step-to pattern;Decreased step length - left;Decreased step length - right;Right foot flat;Left foot flat;Right flexed knee in stance;Left flexed knee in stance;Poor foot clearance - left;Poor foot  clearance - right;Trunk flexed    Ambulation Surface Level;Indoor    Gait velocity decreased    Gait Comments gait training with CGA and turns, head turns      Neuro Re-ed    Neuro Re-ed Details  R/L wt shift at II bars 2x10;  alt toe tap on cone with 1 UE support and CGA 2x10   hesitancy with wt shift, requiring VCs/TCs     Knee/Hip Exercises: Stretches   Gastroc Stretch Right;Left;30 seconds;2 reps    Gastroc Stretch Limitations toes on 1/2 foam at II bars      Knee/Hip Exercises: Aerobic   Nustep L3 x 6 min (UEs/LEs)      Knee/Hip Exercises: Standing   Knee Flexion Strengthening;Right;Left;1 set;10 reps    Knee Flexion Limitations HS curl with yellow loop in II bars    Hip Flexion Stengthening;Right;Left;2 sets;10 reps    Hip Flexion Limitations resisted march with yellow TB in II bars      Knee/Hip Exercises: Seated   Sit to Sand 1 set;5 reps;without UE support   red medball at chest; cues for proper transfer set up, wider BOS, and core contraction upon standing; CGA/min A                    PT Education - 07/27/21 1008     Education Details update to HEP- added in R/L wt shifts;   Access Code: [email or web address]    Person(s) Educated Patient    Methods Explanation;Demonstration;Tactile cues;Verbal cues;Handout    Comprehension Verbalized understanding;Returned demonstration              PT Short Term Goals - 07/27/21 1011       PT SHORT TERM GOAL #1   Title Patient to be independent with initial HEP.    Time 3    Period Weeks    Status Achieved    Target Date 08/08/21               PT Long Term Goals - 07/25/21 1224       PT LONG TERM GOAL #1   Title Patient to be independent with advanced HEP.    Time 8    Period Weeks    Status On-going      PT LONG TERM GOAL #2   Title Patient to demonstrate B LE strength >/=4+/5.    Time 8    Period Weeks    Status On-going      PT LONG TERM GOAL #3   Title Patient to score at least 46/56 on [place] in  order to decrease risk of falls.    Time 8    Period Weeks    Status On-going      PT LONG TERM GOAL #4   Title Patient to complete TUG in <14 sec with LRAD in order to decrease risk of falls.    Time 8    Period Weeks    Status On-going      PT LONG TERM GOAL #5   Title Patient to demonstrate 5xSTS test in <15 sec in order to decrease risk of falls.    Time 8    Period Weeks    Status On-going      PT LONG TERM GOAL #6   Title Patient to demonstrate safe floor transfer with mod I.    Time 8    Period Weeks    Status On-going                   Plan - 07/27/21 1010     Clinical Impression Statement Patient arrived to session ambulating with [facility] with no new complaints. Reports still using 4WW most times with ambulation. Diastolic BP appeared slightly elevated this AM however patient asymptomatic. Worked on Personnel officer  with SPC with turns and head turns with patient demonstrating slowing speed with fatigue and c/o mild dizziness with turning. STS transfers were progressed with added weighted resistance with evident instability upon standing; improved with wider BOS. Standing LE strengthening and balance activities required intermittent sitting rest breaks d/t fatigue. Patient hesitant to fully weight shift onto 1 LE with toe taps, requiring practice of wt shifting. Updated this exercise into HEP- patient reported understanding and without complaints at end of session.    Comorbidities anemia, breast CA s/p surgery, lung CA s/p radiation and chemo, lymphoma on chemo, thyroid CA, HLD, hiatal hernia, HTN, a-fib, pacemaker, R reverse TSA 2019, stroke 2003, SVC syndrome, R TKA 2013    PT Treatment/Interventions ADLs/Self Care Home Management;Cryotherapy;Electrical Stimulation;DME Instruction;Moist Heat;Gait training;Stair training;Functional mobility training;Therapeutic activities;Therapeutic exercise;Balance training;Neuromuscular re-education;Manual techniques;Patient/family  education;Passive range of motion;Dry needling;Energy conservation;Vestibular;Vasopneumatic Device;Taping    PT Next Visit Plan balance training with SLS activities; progress LE stretching and strengthening to improve posture and gait deviations; continue gait training with SPC    Consulted and Agree with Plan of Care Patient             Patient will benefit from skilled therapeutic intervention in order to improve the following deficits and impairments:  Abnormal gait, Decreased range of motion, Difficulty walking, Increased fascial restricitons, Increased muscle spasms, Decreased safety awareness, Decreased endurance, Cardiopulmonary status limiting activity, Decreased activity tolerance, Decreased balance, Impaired flexibility, Improper body mechanics, Postural dysfunction, Decreased strength  Visit Diagnosis: Unsteadiness on feet  Repeated falls  Muscle weakness (generalized)  Other abnormalities of gait and mobility     Problem List Patient Active Problem List   Diagnosis Date Noted   Hypertension    Pressure injury of skin 03/15/2021   Intracranial hemorrhage (HCC) 03/14/2021   Lower extremity edema    Polymicrobial bacterial infection    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    Enterococcus faecalis infection    Sepsis due to Escherichia coli (HCC)    Debility 02/13/2021   Infection of wound hematoma 02/05/2021   Physical debility 12/22/2020   Tear of left hamstring 12/22/2020   Labral tear of left hip joint 12/22/2020   Acute blood loss anemia    Orthostatic hypotension 12/17/2020   History of CVA (cerebrovascular accident) 12/17/2020   Fall 12/15/2020   Hip hematoma, left, initial encounter 12/15/2020   Nondisplaced fracture of coronoid process of left ulna, initial encounter for closed fracture 12/15/2020   Multiple rib fractures 12/15/2020   COVID-19 virus infection 10/12/2020   Constipation 06/22/2020   Change in bowel habits 06/22/2020   Diverticulosis  06/22/2020   Dark stools 06/22/2020   Hemorrhoids 06/22/2020   Pacemaker 01/23/2020   Junctional rhythm 01/23/2020   Palpitations 01/23/2020   ICH (intracerebral hemorrhage) (HCC) 12/02/2019   A-fib (HCC) 12/02/2019   Anemia 12/02/2019   QT prolongation 12/02/2019   Hypothyroidism 12/02/2019   Gait abnormality 07/10/2018   S/P reverse total shoulder arthroplasty, 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   Persistent atrial fibrillation (HCC)    Bilateral ureteral calculi 11/17/2017   Atrial fibrillation with RVR (HCC) 09/15/2017   Atrial flutter (HCC) 07/17/2017   Port catheter in place 04/02/2017   Non-Hodgkin lymphoma, unspecified, intrathoracic lymph nodes (HCC) 02/13/2017   Mediastinal mass 02/06/2017   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   Abnormal chest x-ray    Lung mass 02/03/2017   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   OSA (obstructive sleep apnea) 02/03/2015   History of renal calculi 11/16/2014  Renal calculi 11/16/2014   Family history of colon cancer 10/26/2014   Mechanical complication due to cardiac pacemaker pulse generator 11/06/2013   Dyspnea 05/12/2013   Hypothyroidism 04/21/2013   Pleural effusion 04/19/2013   Long term (current) use of anticoagulants 12/20/2010   EPIDERMOID CYST 08/22/2010   Chronic diastolic heart failure ([place]) 08/06/2010   SYNCOPE AND COLLAPSE 07/27/2010   OSTEOARTHRITIS, KNEE, RIGHT 03/15/2010   Class 1 obesity due to excess calories with body mass index (BMI) of 33.0 to 33.9 in adult 12/07/2009   NONSPEC ELEVATION OF LEVELS OF TRANSAMINASE/LDH 09/08/2009   BREAST CANCER, HX OF 07/25/2009   COLONIC POLYPS, HX OF 07/25/2009   TUBULOVILLOUS ADENOMA, COLON 04/29/2008   HYPERGLYCEMIA, FASTING 04/29/2008   HYPERLIPIDEMIA 06/22/2007   CARCINOMA, THYROID GLAND, HX OF 06/22/2007    Janene Harvey, PT, DPT 07/27/21 10:15 AM   [facility] [address] [place], [place], [address] Phone:  [phone]   Fax:  [phone]  Name: Kayla Harrison MRN: [id number] Date of Birth: 1946/02/21

## 2021-07-27 NOTE — Therapy (Signed)
[facility] [place], [place], [place], [address] Phone: [phone]   Fax:  [phone]  Occupational Therapy Treatment  Patient Details  Name: Kayla Harrison MRN: [id number] Date of Birth: [birth year] Referring Provider (OT): Leonie Man   Encounter Date: 07/27/2021   OT End of Session - 07/27/21 1015     Visit Number 3    Number of Visits 17    Date for OT Re-Evaluation 09/12/21    Authorization Type Medicare A and B    OT Start Time 8144    OT Stop Time 1100    OT Time Calculation (min) 45 min    Activity Tolerance Patient tolerated treatment well    Behavior During Therapy WFL for tasks assessed/performed             Past Medical History:  Diagnosis Date   Anemia    Arthritis    osteoarthritis - knees and right shoulder   Blood transfusion without reported diagnosis    Breast cancer ([place])    Dr Margot Chimes, total thyroidectomy- 1999- for cancer   Brucellosis 1964   Chronic bilateral pleural effusions    Colon polyp    Dr Earlean Shawl   Complication of anesthesia    Ketamine produces LSD reaction, bright colored nightmarish experience    Dyslipidemia    Endometriosis    Fibroids    H/O pleural effusion    s/p thoracentesis w 3242m withdrawn   Hepatitis    [place], ?hepatitis    History of dysphagia    due to radiation therapy   History of hiatal hernia    small noted on PET scan   Hypertension    Hypothyroidism    [place] cancer, [place] lobe [place]   radiation RX completed 03/04/17; will start chemo 6/27, pt unaware of lung cancer   Morbid obesity ([place]    Status post lap band surgery   Nephrolithiasis    Non Hodgkin's lymphoma ([place]    on chemotherapy   Persistent atrial fibrillation ([place]    a. s/p PVI 2008 b. s/p convergent ablation [id number] by bradycardia requiring pacemaker implant   Personal history of radiation therapy    Presence of permanent cardiac  pacemaker    Rotator cuff tear    Right   [place]   SVC syndrome    with lung mass and non hodgkins lymphoma   Thyroid cancer ([place] 2000    Past Surgical History:  Procedure Laterality Date   ABDOMINAL HYSTERECTOMY  1983   afib ablation     a. 2008 PVI b. 2014 convergent ablation   APPENDECTOMY     BONE MARROW BIOPSY  02/21/2017   BREAST LUMPECTOMY Left 2010   bGage    2015- negative   CARDIOVERSION  10/09/2012   Procedure: CARDIOVERSION;  Surgeon: JMinus Breeding MD;  [facility]  Service: Cardiovascular;  Laterality: N/A;   CARDIOVERSION  10/09/2012   Procedure: CARDIOVERSION;  Surgeon: JMinus Breeding MD;  [facility]  Service: Cardiovascular;  Laterality: N/A;  [name] the ok to add pt to the add on , but we must check to find out if the can add pt on at 1400 ([phone]   CARDIOVERSION N/A 11/20/2012   Procedure: CARDIOVERSION;  Surgeon: PFay Records MD;  [facility]  Service: Cardiovascular;  Laterality: N/A;  CARDIOVERSION N/A 07/18/2017   Procedure: CARDIOVERSION;  Surgeon: Acie Fredrickson, Wonda Cheng, MD;  Location: [place];  Service: Cardiovascular;  Laterality: N/A;   CARDIOVERSION N/A 10/03/2017   Procedure: CARDIOVERSION;  Surgeon: Sanda Klein, MD;  [facility];  Service: Cardiovascular;  Laterality: N/A;   CARDIOVERSION N/A 01/07/2018   Procedure: CARDIOVERSION;  Surgeon: Acie Fredrickson Wonda Cheng, MD;  Location: [facility] ENDOSCOPY;  Service: Cardiovascular;  Laterality: N/A;   CARDIOVERSION N/A 12/10/2019   Procedure: CARDIOVERSION;  Surgeon: Buford Dresser, MD;  Location: [facility] ENDOSCOPY;  Service: Cardiovascular;  Laterality: N/A;   CARDIOVERSION N/A 03/09/2021   Procedure: CARDIOVERSION;  Surgeon: Pixie Casino, MD;  Location: [place];  Service: Cardiovascular;  Laterality: N/A;   CHOLECYSTECTOMY     COLONOSCOPY W/ POLYPECTOMY     Dr Earlean Shawl   CYSTOSCOPY N/A 02/06/2015    Procedure: CYSTOSCOPY;  Surgeon: Kathie Rhodes, MD;  [facility];  Service: Urology;  Laterality: N/A;   CYSTOSCOPY W/ RETROGRADES Left 11/17/2017   Procedure: CYSTOSCOPY WITH RETROGRADE /PYELOGRAM/;  Surgeon: Kathie Rhodes, MD;  [facility];  Service: Urology;  Laterality: Left;   CYSTOSCOPY WITH RETROGRADE PYELOGRAM, URETEROSCOPY AND STENT PLACEMENT Right 02/06/2015   Procedure: RETROGRADE PYELOGRAM, RIGHT URETEROSCOPY STENT PLACEMENT;  Surgeon: Kathie Rhodes, MD;  [facility];  Service: Urology;  Laterality: Right;   CYSTOSCOPY WITH RETROGRADE PYELOGRAM, URETEROSCOPY AND STENT PLACEMENT Right 03/07/2017   Procedure: CYSTOSCOPY WITH RIGHT RETROGRADE PYELOGRAM,RIGHT  URETEROSCOPYLASER LITHOTRIPSY  AND STENT PLACEMENT AND STONE BASKETRY;  Surgeon: Kathie Rhodes, MD;  Location: [place];  Service: Urology;  Laterality: Right;   EYE SURGERY     cataract surgery   fatty mass removal  1999   pubic area   HOLMIUM LASER APPLICATION N/A 7/89/3810   Procedure: HOLMIUM LASER APPLICATION;  Surgeon: Kathie Rhodes, MD;  [facility];  Service: Urology;  Laterality: N/A;   HOLMIUM LASER APPLICATION Right 1/75/1025   Procedure: HOLMIUM LASER APPLICATION;  Surgeon: Kathie Rhodes, MD;  Location: [facility];  Service: Urology;  Laterality: Right;   HOLMIUM LASER APPLICATION Left 8/52/7782   Procedure: HOLMIUM LASER APPLICATION;  Surgeon: Kathie Rhodes, MD;  [facility];  Service: Urology;  Laterality: Left;   I & D EXTREMITY Left 12/19/2020   Procedure: IRRIGATION AND DEBRIDEMENT OF LEFT HIP HEMATOMA WITH APPLICATION OF WOUND VAC;  Surgeon: Erle Crocker, MD;  Location: [place];  Service: Orthopedics;  Laterality: Left;   I & D EXTREMITY Left 02/06/2021   Procedure: IRRIGATION AND DEBRIDEMENT DEEP ABCESS LEFT THIGH, SECONDARY CLOSURE OF WOUND DEHISCENCE;  Surgeon: Erle Crocker, MD;  Location: [place];  Service: Orthopedics;  Laterality: Left;   IR  FLUORO GUIDE PORT INSERTION RIGHT  02/24/2017   IR NEPHROSTOMY PLACEMENT RIGHT  11/17/2017   IR PATIENT EVAL TECH 0-60 MINS  03/11/2017   IR REMOVAL TUN ACCESS W/ PORT W/O FL MOD SED  04/20/2018   IR US GUIDE VASC ACCESS RIGHT  02/24/2017   KNEE ARTHROSCOPY     bilateral   LAPAROSCOPIC GASTRIC BANDING  07/10/2010   LAPAROSCOPIC GASTRIC BANDING     Laparoscopic adjustable banding APS System with posterior hiatal hernia, 2 suture.   LAPAROTOMY     for ruptured ovary and ovarian artery    NEPHROLITHOTOMY Right 11/17/2017   Procedure: NEPHROLITHOTOMY PERCUTANEOUS;  Surgeon: Kathie Rhodes, MD;  [facility];  Service: Urology;  Laterality: Right;   PACEMAKER INSERTION  03/10/2013   MDT dual chamber PPM  POCKET REVISION N/A 12/08/2013   Procedure: POCKET REVISION;  Surgeon: Deboraha Sprang, MD;  Location: [facility] CATH LAB;  Service: Cardiovascular;  Laterality: N/A;   PORTA CATH INSERTION     REVERSE SHOULDER ARTHROPLASTY Right 05/14/2018   Procedure: RIGHT REVERSE SHOULDER ARTHROPLASTY;  Surgeon: Tania Ade, MD;  Location: [place];  Service: Orthopedics;  Laterality: Right;   REVERSE SHOULDER REPLACEMENT Right 05/14/2018   RIGHT HEART CATH N/A 07/21/2019   Procedure: RIGHT HEART CATH;  Surgeon: Larey Dresser, MD;  Location: [place] CV LAB;  Service: Cardiovascular;  Laterality: N/A;   TEE WITH CARDIOVERSION  09/22/2017   TEE WITHOUT CARDIOVERSION N/A 10/03/2017   Procedure: TRANSESOPHAGEAL ECHOCARDIOGRAM (TEE);  Surgeon: Sanda Klein, MD;  Location: [facility] ENDOSCOPY;  Service: Cardiovascular;  Laterality: N/A;   TEE WITHOUT CARDIOVERSION N/A 08/04/2019   Procedure: TRANSESOPHAGEAL ECHOCARDIOGRAM (TEE);  Surgeon: Larey Dresser, MD;  Location: [facility] ENDOSCOPY;  Service: Cardiovascular;  Laterality: N/A;   TEE WITHOUT CARDIOVERSION N/A 12/10/2019   Procedure: TRANSESOPHAGEAL ECHOCARDIOGRAM (TEE);  Surgeon: Buford Dresser, MD;  Location: [facility]OSCOPY;  Service: Cardiovascular;  Laterality:  N/A;   TEE WITHOUT CARDIOVERSION N/A 03/09/2021   Procedure: TRANSESOPHAGEAL ECHOCARDIOGRAM (TEE);  Surgeon: Pixie Casino, MD;  Location: [place];  Service: Cardiovascular;  Laterality: N/A;   THYROIDECTOMY  1998   Dr Margot Chimes   TONSILLECTOMY     TOTAL KNEE ARTHROPLASTY  04/13/2012   Procedure: TOTAL KNEE ARTHROPLASTY;  Surgeon: Rudean Haskell, MD;  Location: [place];  Service: Orthopedics;  Laterality: Right;   VIDEO BRONCHOSCOPY WITH ENDOBRONCHIAL ULTRASOUND N/A 02/07/2017   Procedure: VIDEO BRONCHOSCOPY WITH ENDOBRONCHIAL ULTRASOUND;  Surgeon: Marshell Garfinkel, MD;  Location: [place];  Service: Pulmonary;  Laterality: N/A;    There were no vitals filed for this visit.   Subjective Assessment - 07/27/21 1026     Subjective  Pt reports feeling exhausted after last therapy session and going home and taking a nap.    Pertinent History h/o hyperlipidemia, atrial fibrillation with pacemaker on Eliquis, lymphoma, confusion, subcortical hemorrhage on the left with intraventricular extension    Currently in Pain? No/denies                   Therapeutic activity to simulate homemaking tasks/laundry.  Engaged in sit > stand without use of hands to simulate standing from Rollator to move items from washing machine to stacked dryer.  Engaged in reaching low and high with 1 kg medicine ball to challenge balance and reaching as needed for laundry task.  Pt required CGA when standing without use of hands and completing overhead and semi-squat reaching with medicine ball.    Completed pipe tree puzzle task in standing with pt tolerating standing 7 mins with reports of fatigue but able to complete task prior to sitting.  Pt required min cues for problem solving errors.  Discussed carryover to functional tasks at home, pt able to provide personal experience with money.  Educated on vision with increased contrast when administering meds to compensate for low vision.  Pt asking about return to  driving.  Discussed problem solving and awareness in regards to meal prep, laundry, balancing check book, driving.  Educated on starting small and more frequent/short tasks to increase activity tolerance with homemaking tasks.                 OT Short Term Goals - 07/25/21 1022       OT SHORT TERM GOAL #1   Title  Pt will verbalize understanding of 3 compensatory strategies for low vision.    Time 4    Period Weeks    Status On-going    Target Date 08/15/21      OT SHORT TERM GOAL #2   Title Pt will improve hand strength and endurance to improve handwriting legiblity to 90%    Time 4    Period Weeks    Status On-going      OT SHORT TERM GOAL #3   Title Pt will demonstrate improved activity tolerance to complete standing acitivity for 15 mins without seated rest break.    Time 4    Period Weeks    Status On-going      OT SHORT TERM GOAL #4   Title Pt will improve shoulder ROM to 140* in RUE to increase functional reach in to cabinets    Time 4    Period Weeks    Status On-going               OT Long Term Goals - 07/25/21 1022       OT LONG TERM GOAL #1   Title Pt will be independent in energy conservation strategies.    Time 8    Period Weeks    Status On-going      OT LONG TERM GOAL #2   Title Pt will be independent in modifications for low vision.    Time 8    Period Weeks    Status On-going      OT LONG TERM GOAL #3   Title Pt wil be able to complete housekeeping task incorporating reaching outside BOS without reports of dizziness.    Time 8    Period Weeks    Status On-going      OT LONG TERM GOAL #4   Title Pt will be able to complete dual task activity without cues for sequencing and with improved safety awareness.    Time 8    Period Weeks    Status On-going                   Plan - 07/27/21 1159     Clinical Impression Statement Pt is a 75 y/o female who presents to OP OT due to nontraumatic subcortical hemorrhage of  left cerebral hemisphere. Pt currently lives alone in a one level condo and is retired prior to onset. PMHx includes hyperlipidemia, atrial fibrillation with pacemaker on Eliquis, lymphoma, confusion. Pt will benefit from skilled occupational therapy services to address strength and endurance, ROM, balance, fine motor control, safety awareness, introduction of compensatory strategies/AE prn, energy conservation, vision, and implementation of an HEP to improve participation and safety during ADLs and IADLs.    OT Occupational Profile and History Detailed Assessment- Review of Records and additional review of physical, cognitive, psychosocial history related to current functional performance    Occupational performance deficits (Please refer to evaluation for details): ADL's;IADL's;Leisure;Social Participation    Body Structure / Function / Physical Skills ADL;Balance;Body mechanics;Cardiopulmonary status limiting activity;Coordination;Endurance;FMC;GMC;IADL;Mobility;Pain;ROM;Proprioception;Sensation;Strength;UE functional use;Vision    Psychosocial Skills Environmental  Adaptations;Routines and Behaviors    Rehab Potential Good    Clinical Decision Making Limited treatment options, no task modification necessary    Comorbidities Affecting Occupational Performance: May have comorbidities impacting occupational performance    Modification or Assistance to Complete Evaluation  No modification of tasks or assist necessary to complete eval    OT Frequency 2x / week    OT Duration 8 weeks  OT Treatment/Interventions Self-care/ADL training;Cryotherapy;Electrical Stimulation;Ultrasound;Moist Heat;Fluidtherapy;Therapeutic exercise;Neuromuscular education;Energy conservation;Manual Therapy;Functional Mobility Training;DME and/or AE instruction;Passive range of motion;Patient/family education;Visual/perceptual remediation/compensation;Cognitive remediation/compensation;Therapeutic activities;Balance  training;Psychosocial skills training;Coping strategies training    Plan Vision handout for low vision and explore accommodations.  Focus on activity tolerance and reaching outside BOS - particularly low to mid to high reach for laundry tasks    OT Home Exercise Plan dynamic balance activities, review theraputty and theraband exercises    Consulted and Agree with Plan of Care Patient             Patient will benefit from skilled therapeutic intervention in order to improve the following deficits and impairments:   Body Structure / Function / Physical Skills: ADL, Balance, Body mechanics, Cardiopulmonary status limiting activity, Coordination, Endurance, FMC, GMC, IADL, Mobility, Pain, ROM, Proprioception, Sensation, Strength, UE functional use, Vision   Psychosocial Skills: Environmental  Adaptations, Routines and Behaviors   Visit Diagnosis: Unsteadiness on feet  Muscle weakness (generalized)  Attention and concentration deficit  Blindness, one eye, low vision other eye, unspecified eyes    Problem List Patient Active Problem List   Diagnosis Date Noted   Hypertension    Pressure injury of skin 03/15/2021   Intracranial hemorrhage ([place]) 03/14/2021   Lower extremity edema    Polymicrobial bacterial infection    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    Enterococcus faecalis infection    Sepsis due to Escherichia coli ([place])    Debility 02/13/2021   Infection of wound hematoma 02/05/2021   Physical debility 12/22/2020   Tear of left hamstring 12/22/2020   Labral tear of left hip joint 12/22/2020   Acute blood loss anemia    Orthostatic hypotension 12/17/2020   History of CVA (cerebrovascular accident) 12/17/2020   Fall 12/15/2020   Hip hematoma, left, initial encounter 12/15/2020   Nondisplaced fracture of coronoid process of left ulna, initial encounter for closed fracture 12/15/2020   Multiple rib fractures 12/15/2020   COVID-19 virus infection 10/12/2020    Constipation 06/22/2020   Change in bowel habits 06/22/2020   Diverticulosis 06/22/2020   Dark stools 06/22/2020   Hemorrhoids 06/22/2020   Pacemaker 01/23/2020   Junctional rhythm 01/23/2020   Palpitations 01/23/2020   ICH (intracerebral hemorrhage) ([place]) 12/02/2019   A-fib ([place]) 12/02/2019   Anemia 12/02/2019   QT prolongation 12/02/2019   Hypothyroidism 12/02/2019   Gait abnormality 07/10/2018   S/P reverse total shoulder arthroplasty, 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   Persistent atrial fibrillation ([place])    Bilateral ureteral calculi 11/17/2017   Atrial fibrillation with RVR ([place]) 09/15/2017   Atrial flutter ([address] [address]) 07/17/2017   Port catheter in place 04/02/2017   Non-Hodgkin lymphoma, unspecified, intrathoracic lymph nodes ([place]) 02/13/2017   Mediastinal mass 02/06/2017   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[place]) 02/06/2017   Abnormal chest x-ray    Lung mass 02/03/2017   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   OSA (obstructive sleep apnea) 02/03/2015   History of renal calculi 11/16/2014   Renal calculi 11/16/2014   Family history of colon cancer 10/26/2014   Mechanical complication due to cardiac pacemaker pulse generator 11/06/2013   Dyspnea 05/12/2013   Hypothyroidism 04/21/2013   Pleural effusion 04/19/2013   Long term (current) use of anticoagulants 12/20/2010   EPIDERMOID CYST 08/22/2010   Chronic diastolic heart failure ([place]) 08/06/2010   SYNCOPE AND COLLAPSE 07/27/2010   OSTEOARTHRITIS, KNEE, RIGHT 03/15/2010   Class 1 obesity due to excess calories with body mass index (BMI) of 33.0 to 33.9 in adult 12/07/2009  NONSPEC ELEVATION OF LEVELS OF TRANSAMINASE/LDH 09/08/2009   BREAST CANCER, HX OF 07/25/2009   COLONIC POLYPS, HX OF 07/25/2009   TUBULOVILLOUS ADENOMA, COLON 04/29/2008   HYPERGLYCEMIA, FASTING 04/29/2008   HYPERLIPIDEMIA 06/22/2007   CARCINOMA, THYROID GLAND, HX OF 06/22/2007    Simonne Come, OT/L 07/27/2021, 12:04 PM  [facility] [address] [place], [place], [address] Phone: [phone]   Fax:  [phone]  Name: Kayla Harrison MRN: [id number] Date of Birth: 10-31-1945

## 2021-07-30 ENCOUNTER — Ambulatory Visit: Payer: Medicare Other | Admitting: Occupational Therapy

## 2021-07-30 ENCOUNTER — Encounter: Payer: Self-pay | Admitting: Physical Therapy

## 2021-07-30 ENCOUNTER — Encounter: Payer: Self-pay | Admitting: Occupational Therapy

## 2021-07-30 ENCOUNTER — Other Ambulatory Visit: Payer: Self-pay

## 2021-07-30 ENCOUNTER — Ambulatory Visit: Payer: Medicare Other | Admitting: Physical Therapy

## 2021-07-30 VITALS — BP 125/95 | HR 96

## 2021-07-30 DIAGNOSIS — R296 Repeated falls: Secondary | ICD-10-CM | POA: Diagnosis not present

## 2021-07-30 DIAGNOSIS — R2689 Other abnormalities of gait and mobility: Secondary | ICD-10-CM | POA: Diagnosis not present

## 2021-07-30 DIAGNOSIS — R41840 Attention and concentration deficit: Secondary | ICD-10-CM | POA: Diagnosis not present

## 2021-07-30 DIAGNOSIS — R2681 Unsteadiness on feet: Secondary | ICD-10-CM | POA: Diagnosis not present

## 2021-07-30 DIAGNOSIS — M6281 Muscle weakness (generalized): Secondary | ICD-10-CM

## 2021-07-30 DIAGNOSIS — H5410 Blindness, one eye, low vision other eye, unspecified eyes: Secondary | ICD-10-CM | POA: Diagnosis not present

## 2021-07-30 NOTE — Therapy (Signed)
[facility] [place], [place], [place], [address] Phone: [phone]   Fax:  [phone]  Occupational Therapy Treatment  Patient Details  Name: Kayla Harrison MRN: [id number] Date of Birth: 09/06/1946 Referring Provider (OT): Leonie Man   Encounter Date: 07/30/2021   OT End of Session - 07/30/21 0942     Visit Number 4    Number of Visits 17    Date for OT Re-Evaluation 09/12/21    Authorization Type Medicare A and B    OT Start Time 1012    OT Stop Time 1057    OT Time Calculation (min) 45 min    Activity Tolerance Patient limited by fatigue    Behavior During Therapy [facility] for tasks assessed/performed             Past Medical History:  Diagnosis Date   Anemia    Arthritis    osteoarthritis - knees and right shoulder   Blood transfusion without reported diagnosis    Breast cancer ([place])    Dr Margot Chimes, total thyroidectomy- 1999- for cancer   Brucellosis 1964   Chronic bilateral pleural effusions    Colon polyp    Dr Earlean Shawl   Complication of anesthesia    Ketamine produces LSD reaction, bright colored nightmarish experience    Dyslipidemia    Endometriosis    Fibroids    H/O pleural effusion    s/p thoracentesis w 3250ml withdrawn   Hepatitis    [place] ?hepatitis    History of dysphagia    due to radiation therapy   History of hiatal hernia    small noted on PET scan   Hypertension    Hypothyroidism    Lung cancer, lower lobe ([place]) 01/2017   radiation RX completed 03/04/17; will start chemo 6/27, pt unaware of lung cancer   Morbid obesity ([place])    Status post lap band surgery   Nephrolithiasis    Non Hodgkin's lymphoma ([place])    on chemotherapy   Persistent atrial fibrillation ([place])    a. s/p PVI 2008 b. s/p convergent ablation 4920 complicated by bradycardia requiring pacemaker implant   Personal history of radiation therapy    Presence of permanent cardiac  pacemaker    Rotator cuff tear    Right   Stroke ([place])    2003- [place] x2   SVC syndrome    with lung mass and non hodgkins lymphoma   Thyroid cancer ([place]) 2000    Past Surgical History:  Procedure Laterality Date   ABDOMINAL HYSTERECTOMY  1983   afib ablation     a. 2008 PVI b. 2014 convergent ablation   APPENDECTOMY     BONE MARROW BIOPSY  02/21/2017   BREAST LUMPECTOMY Left 2010   [place]     2015- negative   CARDIOVERSION  10/09/2012   Procedure: CARDIOVERSION;  Surgeon: Minus Breeding, MD;  Location: [place];  Service: Cardiovascular;  Laterality: N/A;   CARDIOVERSION  10/09/2012   Procedure: CARDIOVERSION;  Surgeon: Minus Breeding, MD;  Location: [facility] ENDOSCOPY;  Service: Cardiovascular;  Laterality: N/A;  [name] gave the ok to add pt to the add on , but we must check to find out if the can add pt on at [phone])   CARDIOVERSION N/A 11/20/2012   Procedure: CARDIOVERSION;  Surgeon: Fay Records, MD;  Location: [facility] ENDOSCOPY;  Service: Cardiovascular;  Laterality: N/A;  CARDIOVERSION N/A 07/18/2017   Procedure: CARDIOVERSION;  Surgeon: Acie Fredrickson, Wonda Cheng, MD;  Location: [place];  Service: Cardiovascular;  Laterality: N/A;   CARDIOVERSION N/A 10/03/2017   Procedure: CARDIOVERSION;  Surgeon: Sanda Klein, MD;  Location: [facility] ENDOSCOPY;  Service: Cardiovascular;  Laterality: N/A;   CARDIOVERSION N/A 01/07/2018   Procedure: CARDIOVERSION;  Surgeon: Acie Fredrickson Wonda Cheng, MD;  Location: [facility] ENDOSCOPY;  Service: Cardiovascular;  Laterality: N/A;   CARDIOVERSION N/A 12/10/2019   Procedure: CARDIOVERSION;  Surgeon: Buford Dresser, MD;  Location: [facility] ENDOSCOPY;  Service: Cardiovascular;  Laterality: N/A;   CARDIOVERSION N/A 03/09/2021   Procedure: CARDIOVERSION;  Surgeon: Pixie Casino, MD;  Location: [place];  Service: Cardiovascular;  Laterality: N/A;   CHOLECYSTECTOMY     COLONOSCOPY W/ POLYPECTOMY     Dr Earlean Shawl   CYSTOSCOPY N/A 02/06/2015    Procedure: CYSTOSCOPY;  Surgeon: Kathie Rhodes, MD;  Location: [facility];  Service: Urology;  Laterality: N/A;   CYSTOSCOPY W/ RETROGRADES Left 11/17/2017   Procedure: CYSTOSCOPY WITH RETROGRADE /PYELOGRAM/;  Surgeon: Kathie Rhodes, MD;  Location: [facility];  Service: Urology;  Laterality: Left;   CYSTOSCOPY WITH RETROGRADE PYELOGRAM, URETEROSCOPY AND STENT PLACEMENT Right 02/06/2015   Procedure: RETROGRADE PYELOGRAM, RIGHT URETEROSCOPY STENT PLACEMENT;  Surgeon: Kathie Rhodes, MD;  Location: [facility];  Service: Urology;  Laterality: Right;   CYSTOSCOPY WITH RETROGRADE PYELOGRAM, URETEROSCOPY AND STENT PLACEMENT Right 03/07/2017   Procedure: CYSTOSCOPY WITH RIGHT RETROGRADE PYELOGRAM,RIGHT  URETEROSCOPYLASER LITHOTRIPSY  AND STENT PLACEMENT AND STONE BASKETRY;  Surgeon: Kathie Rhodes, MD;  Location: [place];  Service: Urology;  Laterality: Right;   EYE SURGERY     cataract surgery   fatty mass removal  1999   pubic area   HOLMIUM LASER APPLICATION N/A 7/89/3810   Procedure: HOLMIUM LASER APPLICATION;  Surgeon: Kathie Rhodes, MD;  Location: [facility];  Service: Urology;  Laterality: N/A;   HOLMIUM LASER APPLICATION Right 1/75/1025   Procedure: HOLMIUM LASER APPLICATION;  Surgeon: Kathie Rhodes, MD;  Location: [facility];  Service: Urology;  Laterality: Right;   HOLMIUM LASER APPLICATION Left 8/52/7782   Procedure: HOLMIUM LASER APPLICATION;  Surgeon: Kathie Rhodes, MD;  Location: [facility];  Service: Urology;  Laterality: Left;   I & D EXTREMITY Left 12/19/2020   Procedure: IRRIGATION AND DEBRIDEMENT OF LEFT HIP HEMATOMA WITH APPLICATION OF WOUND VAC;  Surgeon: Erle Crocker, MD;  Location: [place];  Service: Orthopedics;  Laterality: Left;   I & D EXTREMITY Left 02/06/2021   Procedure: IRRIGATION AND DEBRIDEMENT DEEP ABCESS LEFT THIGH, SECONDARY CLOSURE OF WOUND DEHISCENCE;  Surgeon: Erle Crocker, MD;  Location: [place];  Service: Orthopedics;  Laterality: Left;   IR  FLUORO GUIDE PORT INSERTION RIGHT  02/24/2017   IR NEPHROSTOMY PLACEMENT RIGHT  11/17/2017   IR PATIENT EVAL TECH 0-60 MINS  03/11/2017   IR REMOVAL TUN ACCESS W/ PORT W/O FL MOD SED  04/20/2018   IR US GUIDE VASC ACCESS RIGHT  02/24/2017   KNEE ARTHROSCOPY     bilateral   LAPAROSCOPIC GASTRIC BANDING  07/10/2010   LAPAROSCOPIC GASTRIC BANDING     Laparoscopic adjustable banding APS System with posterior hiatal hernia, 2 suture.   LAPAROTOMY     for ruptured ovary and ovarian artery    NEPHROLITHOTOMY Right 11/17/2017   Procedure: NEPHROLITHOTOMY PERCUTANEOUS;  Surgeon: Kathie Rhodes, MD;  Location: [facility];  Service: Urology;  Laterality: Right;   PACEMAKER INSERTION  03/10/2013   MDT dual chamber PPM  POCKET REVISION N/A 12/08/2013   Procedure: POCKET REVISION;  Surgeon: Deboraha Sprang, MD;  Location: [facility] CATH LAB;  Service: Cardiovascular;  Laterality: N/A;   PORTA CATH INSERTION     REVERSE SHOULDER ARTHROPLASTY Right 05/14/2018   Procedure: RIGHT REVERSE SHOULDER ARTHROPLASTY;  Surgeon: Tania Ade, MD;  Location: [place];  Service: Orthopedics;  Laterality: Right;   REVERSE SHOULDER REPLACEMENT Right 05/14/2018   RIGHT HEART CATH N/A 07/21/2019   Procedure: RIGHT HEART CATH;  Surgeon: Larey Dresser, MD;  Location: [place] CV LAB;  Service: Cardiovascular;  Laterality: N/A;   TEE WITH CARDIOVERSION  09/22/2017   TEE WITHOUT CARDIOVERSION N/A 10/03/2017   Procedure: TRANSESOPHAGEAL ECHOCARDIOGRAM (TEE);  Surgeon: Sanda Klein, MD;  Location: [facility] ENDOSCOPY;  Service: Cardiovascular;  Laterality: N/A;   TEE WITHOUT CARDIOVERSION N/A 08/04/2019   Procedure: TRANSESOPHAGEAL ECHOCARDIOGRAM (TEE);  Surgeon: Larey Dresser, MD;  Location: [facility] ENDOSCOPY;  Service: Cardiovascular;  Laterality: N/A;   TEE WITHOUT CARDIOVERSION N/A 12/10/2019   Procedure: TRANSESOPHAGEAL ECHOCARDIOGRAM (TEE);  Surgeon: Buford Dresser, MD;  Location: [facility] ENDOSCOPY;  Service: Cardiovascular;  Laterality:  N/A;   TEE WITHOUT CARDIOVERSION N/A 03/09/2021   Procedure: TRANSESOPHAGEAL ECHOCARDIOGRAM (TEE);  Surgeon: Pixie Casino, MD;  Location: [place];  Service: Cardiovascular;  Laterality: N/A;   THYROIDECTOMY  1998   Dr Margot Chimes   TONSILLECTOMY     TOTAL KNEE ARTHROPLASTY  04/13/2012   Procedure: TOTAL KNEE ARTHROPLASTY;  Surgeon: Rudean Haskell, MD;  Location: [place];  Service: Orthopedics;  Laterality: Right;   VIDEO BRONCHOSCOPY WITH ENDOBRONCHIAL ULTRASOUND N/A 02/07/2017   Procedure: VIDEO BRONCHOSCOPY WITH ENDOBRONCHIAL ULTRASOUND;  Surgeon: Marshell Garfinkel, MD;  Location: [place];  Service: Pulmonary;  Laterality: N/A;    There were no vitals filed for this visit.   Subjective Assessment - 07/30/21 0940     Subjective  Pt reports fatigue and not being able to get a full/deep breath.  Pt reports this has been going on about 3 weeks and that she feels that she is not getting more than 3-4 hours of sleep at night.    Pertinent History h/o hyperlipidemia, atrial fibrillation with pacemaker on Eliquis, lymphoma, confusion, subcortical hemorrhage on the left with intraventricular extension    Currently in Pain? No/denies                 Peg board pattern replication with focus on visual scanning, problem solving, awareness of errors, and hand strengthening to carryover to handwriting task.  Pt challenged to remove pegs from resistive foam board for strengthening.  Pt able to complete task without errors.  Increased challenge to replicate pattern with pattern removed, pt requiring mod cues to recall pattern.    Tracing shapes with focus on improved motor control and attention to detail for pre-writing.  Pt reports typically having increased errors towards end of writing passage, due to fatigue.  Therapist noted pt with improvements when going more slowly, encouraged pt to take her time with handwriting tasks.  Noted increased errors as pt fatigued with task.  Therapist encouraged pt  to bring in her "work book" from home as she reports doing daily work including writing that she has most difficulty with.  Provided pt with [place] handout, reiterated modifications to home setup and routine with grocery list and grocery shopping.  Pt to assess home setup per recommendations, reports making many modifications already.  OT Short Term Goals - 07/25/21 1022       OT SHORT TERM GOAL #1   Title Pt will verbalize understanding of 3 compensatory strategies for low vision.    Time 4    Period Weeks    Status On-going    Target Date 08/15/21      OT SHORT TERM GOAL #2   Title Pt will improve hand strength and endurance to improve handwriting legiblity to 90%    Time 4    Period Weeks    Status On-going      OT SHORT TERM GOAL #3   Title Pt will demonstrate improved activity tolerance to complete standing acitivity for 15 mins without seated rest break.    Time 4    Period Weeks    Status On-going      OT SHORT TERM GOAL #4   Title Pt will improve shoulder ROM to 140* in RUE to increase functional reach in to cabinets    Time 4    Period Weeks    Status On-going               OT Long Term Goals - 07/25/21 1022       OT LONG TERM GOAL #1   Title Pt will be independent in energy conservation strategies.    Time 8    Period Weeks    Status On-going      OT LONG TERM GOAL #2   Title Pt will be independent in modifications for low vision.    Time 8    Period Weeks    Status On-going      OT LONG TERM GOAL #3   Title Pt wil be able to complete housekeeping task incorporating reaching outside BOS without reports of dizziness.    Time 8    Period Weeks    Status On-going      OT LONG TERM GOAL #4   Title Pt will be able to complete dual task activity without cues for sequencing and with improved safety awareness.    Time 8    Period Weeks    Status On-going                   Plan - 07/30/21 1111      Clinical Impression Statement Pt continues to be limited by overall fatigue and decreased endurance.  Pt reports napping after therapy session due to fatigue and then only sleeping 3-4 hours at night and then awake with inability to go back to sleep.  Pt is limited in therapy sessions due to having just completed PT session and with decreased endurance/activity tolerance. Pt will continue to benefit from skilled occupational therapy services to address strength and endurance, FMC/GMC, safety awareness, energy conservation, and safety with ADLs and IADLs.    OT Occupational Profile and History Detailed Assessment- Review of Records and additional review of physical, cognitive, psychosocial history related to current functional performance    Occupational performance deficits (Please refer to evaluation for details): ADL's;IADL's;Leisure;Social Participation    Body Structure / Function / Physical Skills ADL;Balance;Body mechanics;Cardiopulmonary status limiting activity;Coordination;Endurance;FMC;GMC;IADL;Mobility;Pain;ROM;Proprioception;Sensation;Strength;UE functional use;Vision    Psychosocial Skills Environmental  Adaptations;Routines and Behaviors    Rehab Potential Good    Clinical Decision Making Limited treatment options, no task modification necessary    Comorbidities Affecting Occupational Performance: May have comorbidities impacting occupational performance    Modification or Assistance to Complete Evaluation  No modification of tasks or assist necessary to complete  eval    OT Frequency 2x / week    OT Duration 8 weeks    OT Treatment/Interventions Self-care/ADL training;Cryotherapy;Electrical Stimulation;Ultrasound;Moist Heat;Fluidtherapy;Therapeutic exercise;Neuromuscular education;Energy conservation;Manual Therapy;Functional Mobility Training;DME and/or AE instruction;Passive range of motion;Patient/family education;Visual/perceptual remediation/compensation;Cognitive  remediation/compensation;Therapeutic activities;Balance training;Psychosocial skills training;Coping strategies training    Plan handwriting to inlude passage or grocery list.  Continue gross and fine motor strengtening for handwriting.  Dynamic balance/mobility while addressing visual fields, as pt wants to return to driving.    Consulted and Agree with Plan of Care Patient             Patient will benefit from skilled therapeutic intervention in order to improve the following deficits and impairments:   Body Structure / Function / Physical Skills: ADL, Balance, Body mechanics, Cardiopulmonary status limiting activity, Coordination, Endurance, FMC, GMC, IADL, Mobility, Pain, ROM, Proprioception, Sensation, Strength, UE functional use, Vision   Psychosocial Skills: Environmental  Adaptations, Routines and Behaviors   Visit Diagnosis: Unsteadiness on feet  Muscle weakness (generalized)  Attention and concentration deficit  Blindness, one eye, low vision other eye, unspecified eyes    Problem List Patient Active Problem List   Diagnosis Date Noted   Hypertension    Pressure injury of skin 03/15/2021   Intracranial hemorrhage ([place]) 03/14/2021   Lower extremity edema    Polymicrobial bacterial infection    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    Enterococcus faecalis infection    Sepsis due to Escherichia coli ([place])    Debility 02/13/2021   Infection of wound hematoma 02/05/2021   Physical debility 12/22/2020   Tear of left hamstring 12/22/2020   Labral tear of left hip joint 12/22/2020   Acute blood loss anemia    Orthostatic hypotension 12/17/2020   History of CVA (cerebrovascular accident) 12/17/2020   Fall 12/15/2020   Hip hematoma, left, initial encounter 12/15/2020   Nondisplaced fracture of coronoid process of left ulna, initial encounter for closed fracture 12/15/2020   Multiple rib fractures 12/15/2020   COVID-19 virus infection 10/12/2020   Constipation  06/22/2020   Change in bowel habits 06/22/2020   Diverticulosis 06/22/2020   Dark stools 06/22/2020   Hemorrhoids 06/22/2020   Pacemaker 01/23/2020   Junctional rhythm 01/23/2020   Palpitations 01/23/2020   ICH (intracerebral hemorrhage) ([place]) 12/02/2019   A-fib ([place]) 12/02/2019   Anemia 12/02/2019   QT prolongation 12/02/2019   Hypothyroidism 12/02/2019   Gait abnormality 07/10/2018   S/P reverse total shoulder arthroplasty, 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   Persistent atrial fibrillation ([place])    Bilateral ureteral calculi 11/17/2017   Atrial fibrillation with RVR ([place]) 09/15/2017   Atrial flutter ([place]) 07/17/2017   Port catheter in place 04/02/2017   Non-Hodgkin lymphoma, unspecified, intrathoracic lymph nodes ([place]) 02/13/2017   Mediastinal mass 02/06/2017   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   Abnormal chest x-ray    Lung mass 02/03/2017   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   OSA (obstructive sleep apnea) 02/03/2015   History of renal calculi 11/16/2014   Renal calculi 11/16/2014   Family history of colon cancer 10/26/2014   Mechanical complication due to cardiac pacemaker pulse generator 11/06/2013   Dyspnea 05/12/2013   Hypothyroidism 04/21/2013   Pleural effusion 04/19/2013   Long term (current) use of anticoagulants 12/20/2010   EPIDERMOID CYST 08/22/2010   Chronic diastolic heart failure ([place]) 08/06/2010   SYNCOPE AND COLLAPSE 07/27/2010   OSTEOARTHRITIS, KNEE, RIGHT 03/15/2010   Class 1 obesity due to excess calories with body mass index (BMI) of 33.0 to  33.9 in adult 12/07/2009   NONSPEC ELEVATION OF LEVELS OF TRANSAMINASE/LDH 09/08/2009   BREAST CANCER, HX OF 07/25/2009   COLONIC POLYPS, HX OF 07/25/2009   TUBULOVILLOUS ADENOMA, COLON 04/29/2008   HYPERGLYCEMIA, FASTING 04/29/2008   HYPERLIPIDEMIA 06/22/2007   CARCINOMA, THYROID GLAND, HX OF 06/22/2007    Simonne Come, OT/L 07/30/2021, 12:01 PM  [facility] [address], [place], [place], [address] Phone: [phone]   Fax:  [phone]  Name: Kayla Harrison MRN: [id number] Date of Birth: June 21, 1946

## 2021-07-30 NOTE — Therapy (Signed)
[facility] [address], [place], [place], [address] Phone: [phone]   Fax:  [phone]  Physical Therapy Treatment  Patient Details  Name: Kayla Harrison MRN: [id number] Date of Birth: 04-07-1946 Referring Provider (PT): Garvin Fila, MD   Encounter Date: 07/30/2021   PT End of Session - 07/30/21 1013     Visit Number 4    Number of Visits 17    Date for PT Re-Evaluation 09/12/21    Authorization Type Medicare & MoO    PT Start Time 0926    PT Stop Time 1009    PT Time Calculation (min) 43 min    Equipment Utilized During Treatment Gait belt    Activity Tolerance Patient tolerated treatment well;Patient limited by fatigue    Behavior During Therapy WFL for tasks assessed/performed             Past Medical History:  Diagnosis Date   Anemia    Arthritis    osteoarthritis - knees and right shoulder   Blood transfusion without reported diagnosis    Breast cancer ([place])    Dr Margot Chimes, total thyroidectomy- 1999- for cancer   Brucellosis 1964   Chronic bilateral pleural effusions    Colon polyp    Dr Earlean Shawl   Complication of anesthesia    Ketamine produces LSD reaction, bright colored nightmarish experience    Dyslipidemia    Endometriosis    Fibroids    H/O pleural effusion    s/p thoracentesis w 3271ml withdrawn   Hepatitis    [place] ?hepatitis    History of dysphagia    due to radiation therapy   History of hiatal hernia    small noted on PET scan   Hypertension    Hypothyroidism    Lung cancer, lower lobe ([place]) 01/2017   radiation RX completed 03/04/17; will start chemo 6/27, pt unaware of lung cancer   Morbid obesity ([place])    Status post lap band surgery   Nephrolithiasis    Non Hodgkin's lymphoma ([place])    on chemotherapy   Persistent atrial fibrillation ([place])    a. s/p PVI 2008 b. s/p convergent ablation 5997 complicated by bradycardia requiring pacemaker  implant   Personal history of radiation therapy    Presence of permanent cardiac pacemaker    Rotator cuff tear    Right   Stroke ([place])    2003- [place] x2   SVC syndrome    with lung mass and non hodgkins lymphoma   Thyroid cancer ([place]) 2000    Past Surgical History:  Procedure Laterality Date   ABDOMINAL HYSTERECTOMY  1983   afib ablation     a. 2008 PVI b. 2014 convergent ablation   APPENDECTOMY     BONE MARROW BIOPSY  02/21/2017   BREAST LUMPECTOMY Left 2010   [place]     2015- negative   CARDIOVERSION  10/09/2012   Procedure: CARDIOVERSION;  Surgeon: Minus Breeding, MD;  Location: [place];  Service: Cardiovascular;  Laterality: N/A;   CARDIOVERSION  10/09/2012   Procedure: CARDIOVERSION;  Surgeon: Minus Breeding, MD;  Location: [facility] ENDOSCOPY;  Service: Cardiovascular;  Laterality: N/A;  [name] gave the ok to add pt to the add on , but we must check to find out if the can add pt on at 1400 [phone])   CARDIOVERSION N/A 11/20/2012   Procedure: CARDIOVERSION;  Surgeon: Nevin Bloodgood  Alcus Dad, MD;  Location: [place];  Service: Cardiovascular;  Laterality: N/A;   CARDIOVERSION N/A 07/18/2017   Procedure: CARDIOVERSION;  Surgeon: Thayer Headings, MD;  Location: [place];  Service: Cardiovascular;  Laterality: N/A;   CARDIOVERSION N/A 10/03/2017   Procedure: CARDIOVERSION;  Surgeon: Sanda Klein, MD;  Location: [facility] ENDOSCOPY;  Service: Cardiovascular;  Laterality: N/A;   CARDIOVERSION N/A 01/07/2018   Procedure: CARDIOVERSION;  Surgeon: Acie Fredrickson Wonda Cheng, MD;  Location: [facility] ENDOSCOPY;  Service: Cardiovascular;  Laterality: N/A;   CARDIOVERSION N/A 12/10/2019   Procedure: CARDIOVERSION;  Surgeon: Buford Dresser, MD;  Location: [facility] ENDOSCOPY;  Service: Cardiovascular;  Laterality: N/A;   CARDIOVERSION N/A 03/09/2021   Procedure: CARDIOVERSION;  Surgeon: Pixie Casino, MD;  Location: [place];  Service: Cardiovascular;  Laterality: N/A;    CHOLECYSTECTOMY     COLONOSCOPY W/ POLYPECTOMY     Dr Earlean Shawl   CYSTOSCOPY N/A 02/06/2015   Procedure: CYSTOSCOPY;  Surgeon: Kathie Rhodes, MD;  Location: [facility];  Service: Urology;  Laterality: N/A;   CYSTOSCOPY W/ RETROGRADES Left 11/17/2017   Procedure: CYSTOSCOPY WITH RETROGRADE /PYELOGRAM/;  Surgeon: Kathie Rhodes, MD;  Location: [facility];  Service: Urology;  Laterality: Left;   CYSTOSCOPY WITH RETROGRADE PYELOGRAM, URETEROSCOPY AND STENT PLACEMENT Right 02/06/2015   Procedure: RETROGRADE PYELOGRAM, RIGHT URETEROSCOPY STENT PLACEMENT;  Surgeon: Kathie Rhodes, MD;  Location: [facility];  Service: Urology;  Laterality: Right;   CYSTOSCOPY WITH RETROGRADE PYELOGRAM, URETEROSCOPY AND STENT PLACEMENT Right 03/07/2017   Procedure: CYSTOSCOPY WITH RIGHT RETROGRADE PYELOGRAM,RIGHT  URETEROSCOPYLASER LITHOTRIPSY  AND STENT PLACEMENT AND STONE BASKETRY;  Surgeon: Kathie Rhodes, MD;  Location: [place];  Service: Urology;  Laterality: Right;   EYE SURGERY     cataract surgery   fatty mass removal  1999   pubic area   HOLMIUM LASER APPLICATION N/A 9/50/7225   Procedure: HOLMIUM LASER APPLICATION;  Surgeon: Kathie Rhodes, MD;  Location: [facility];  Service: Urology;  Laterality: N/A;   HOLMIUM LASER APPLICATION Right 7/50/5183   Procedure: HOLMIUM LASER APPLICATION;  Surgeon: Kathie Rhodes, MD;  Location: [facility];  Service: Urology;  Laterality: Right;   HOLMIUM LASER APPLICATION Left 3/58/2518   Procedure: HOLMIUM LASER APPLICATION;  Surgeon: Kathie Rhodes, MD;  Location: [facility];  Service: Urology;  Laterality: Left;   I & D EXTREMITY Left 12/19/2020   Procedure: IRRIGATION AND DEBRIDEMENT OF LEFT HIP HEMATOMA WITH APPLICATION OF WOUND VAC;  Surgeon: Erle Crocker, MD;  Location: [place];  Service: Orthopedics;  Laterality: Left;   I & D EXTREMITY Left 02/06/2021   Procedure: IRRIGATION AND DEBRIDEMENT DEEP ABCESS LEFT THIGH, SECONDARY CLOSURE OF WOUND DEHISCENCE;  Surgeon:  Erle Crocker, MD;  Location: [place];  Service: Orthopedics;  Laterality: Left;   IR FLUORO GUIDE PORT INSERTION RIGHT  02/24/2017   IR NEPHROSTOMY PLACEMENT RIGHT  11/17/2017   IR PATIENT EVAL TECH 0-60 MINS  03/11/2017   IR REMOVAL TUN ACCESS W/ PORT W/O FL MOD SED  04/20/2018   IR US GUIDE VASC ACCESS RIGHT  02/24/2017   KNEE ARTHROSCOPY     bilateral   LAPAROSCOPIC GASTRIC BANDING  07/10/2010   LAPAROSCOPIC GASTRIC BANDING     Laparoscopic adjustable banding APS System with posterior hiatal hernia, 2 suture.   LAPAROTOMY     for ruptured ovary and ovarian artery    NEPHROLITHOTOMY Right 11/17/2017   Procedure: NEPHROLITHOTOMY PERCUTANEOUS;  Surgeon: Kathie Rhodes, MD;  Location: [facility];  Service: Urology;  Laterality: Right;   PACEMAKER INSERTION  03/10/2013   MDT dual chamber PPM   POCKET REVISION N/A 12/08/2013   Procedure: POCKET REVISION;  Surgeon: Deboraha Sprang, MD;  Location: [facility] CATH LAB;  Service: Cardiovascular;  Laterality: N/A;   PORTA CATH INSERTION     REVERSE SHOULDER ARTHROPLASTY Right 05/14/2018   Procedure: RIGHT REVERSE SHOULDER ARTHROPLASTY;  Surgeon: Tania Ade, MD;  Location: [place];  Service: Orthopedics;  Laterality: Right;   REVERSE SHOULDER REPLACEMENT Right 05/14/2018   RIGHT HEART CATH N/A 07/21/2019   Procedure: RIGHT HEART CATH;  Surgeon: Larey Dresser, MD;  Location: [place] CV LAB;  Service: Cardiovascular;  Laterality: N/A;   TEE WITH CARDIOVERSION  09/22/2017   TEE WITHOUT CARDIOVERSION N/A 10/03/2017   Procedure: TRANSESOPHAGEAL ECHOCARDIOGRAM (TEE);  Surgeon: Sanda Klein, MD;  Location: [facility] ENDOSCOPY;  Service: Cardiovascular;  Laterality: N/A;   TEE WITHOUT CARDIOVERSION N/A 08/04/2019   Procedure: TRANSESOPHAGEAL ECHOCARDIOGRAM (TEE);  Surgeon: Larey Dresser, MD;  Location: [facility] ENDOSCOPY;  Service: Cardiovascular;  Laterality: N/A;   TEE WITHOUT CARDIOVERSION N/A 12/10/2019   Procedure: TRANSESOPHAGEAL ECHOCARDIOGRAM (TEE);  Surgeon:  Buford Dresser, MD;  Location: [facility] ENDOSCOPY;  Service: Cardiovascular;  Laterality: N/A;   TEE WITHOUT CARDIOVERSION N/A 03/09/2021   Procedure: TRANSESOPHAGEAL ECHOCARDIOGRAM (TEE);  Surgeon: Pixie Casino, MD;  Location: [place];  Service: Cardiovascular;  Laterality: N/A;   THYROIDECTOMY  1998   Dr Margot Chimes   TONSILLECTOMY     TOTAL KNEE ARTHROPLASTY  04/13/2012   Procedure: TOTAL KNEE ARTHROPLASTY;  Surgeon: Rudean Haskell, MD;  Location: [place];  Service: Orthopedics;  Laterality: Right;   VIDEO BRONCHOSCOPY WITH ENDOBRONCHIAL ULTRASOUND N/A 02/07/2017   Procedure: VIDEO BRONCHOSCOPY WITH ENDOBRONCHIAL ULTRASOUND;  Surgeon: Marshell Garfinkel, MD;  Location: [place];  Service: Pulmonary;  Laterality: N/A;    Vitals:   07/30/21 0929 07/30/21 0958  BP: 121/90 (!) 125/95  Pulse: (!) 105 96     Subjective Assessment - 07/30/21 0931     Subjective Not feeling good this AM- feeling unsteady but not dizzy. Stumbled when getting out of the car this AM but did not fall. Having some achiness in her anterior thighs recently.    Pertinent History anemia, breast CA s/p surgery, lung CA s/p radiation and chemo, lymphoma on chemo, thyroid CA, HLD, hiatal hernia, HTN, a-fib, pacemaker, R reverse TSA 2019, stroke 2003, SVC syndrome, R TKA 2013    Diagnostic tests 03/15/21 head CT: Similar appearance of left caudothalamic hemorrhage with  intraventricular extension and mild adjacent edema. No new  hemorrhage. Unchanged mild trapping of the left lateral ventricle    Patient Stated Goals be able to get up off the floor    Currently in Pain? No/denies                               [facility] Adult PT Treatment/Exercise - 07/30/21 0001       Neuro Re-ed    Neuro Re-ed Details  R/L wt shift at II bars x10; alt toe tap on 6" step with 1 UE support and CGA 2x10; rolling medball under foot CW circles 10x each ith B UE support on II bar   more difficulty lifting R LE today      Knee/Hip Exercises: Aerobic   Nustep L4 x 6 min (UEs/LEs)      Knee/Hip Exercises: Seated   Sit to Sand 5 reps;without UE support;2 sets  red medball at chest with seat elevated on airex                    PT Education - 07/30/21 1013     Education Details discussion to speak with MD about complaints including insomnia, SOB when sleeping, lump on back    Person(s) Educated Patient    Methods Explanation    Comprehension Verbalized understanding              PT Short Term Goals - 07/27/21 1011       PT SHORT TERM GOAL #1   Title Patient to be independent with initial HEP.    Time 3    Period Weeks    Status Achieved    Target Date 08/08/21               PT Long Term Goals - 07/25/21 1224       PT LONG TERM GOAL #1   Title Patient to be independent with advanced HEP.    Time 8    Period Weeks    Status On-going      PT LONG TERM GOAL #2   Title Patient to demonstrate B LE strength >/=4+/5.    Time 8    Period Weeks    Status On-going      PT LONG TERM GOAL #3   Title Patient to score at least 46/56 on [place] in order to decrease risk of falls.    Time 8    Period Weeks    Status On-going      PT LONG TERM GOAL #4   Title Patient to complete TUG in <14 sec with LRAD in order to decrease risk of falls.    Time 8    Period Weeks    Status On-going      PT LONG TERM GOAL #5   Title Patient to demonstrate 5xSTS test in <15 sec in order to decrease risk of falls.    Time 8    Period Weeks    Status On-going      PT LONG TERM GOAL #6   Title Patient to demonstrate safe floor transfer with mod I.    Time 8    Period Weeks    Status On-going                   Plan - 07/30/21 1014     Clinical Impression Statement Patient arrived to session with report of feeling more unsteady than usual today without known cause. Notes having stumbled when getting out of the car but did not fall. Denies dizziness. Reporting insomnia and trouble  getting a full breath when laying down, only slightly improved with head elevated for the past year. Advised patient to discuss these problems with her MD. Vitals stable with exception of slight tachycardia at rest. Monitored symptoms throughout session. Continued working on STS transfers without UE support- today with seat elevated d/t patient's complaints this AM. Continued work on weight shifts to improve comfort with toe taps. Patient requited verbal and tactile cues for proper weight shifting and demonstrated more difficulty lifting R LE today. Patient quick to fatigue today, requiring several sitting rest breaks. Reporting insomnia and trouble getting a full breath when laying down, only slightly improved with head elevated for the past year. Also reports finding a lump in her midback- upon inspection, does appear to be a small protrusion just R of the spinous process of the lower thoracic  spine. Advised patient to discuss these problems with her PCP. D/t fatigue today, ended session slightly early. No further complaints at end of session.    Comorbidities anemia, breast CA s/p surgery, lung CA s/p radiation and chemo, lymphoma on chemo, thyroid CA, HLD, hiatal hernia, HTN, a-fib, pacemaker, R reverse TSA 2019, stroke 2003, SVC syndrome, R TKA 2013    PT Treatment/Interventions ADLs/Self Care Home Management;Cryotherapy;Electrical Stimulation;DME Instruction;Moist Heat;Gait training;Stair training;Functional mobility training;Therapeutic activities;Therapeutic exercise;Balance training;Neuromuscular re-education;Manual techniques;Patient/family education;Passive range of motion;Dry needling;Energy conservation;Vestibular;Vasopneumatic Device;Taping    PT Next Visit Plan balance training with SLS activities; progress LE stretching and strengthening to improve posture and gait deviations; continue gait training with SPC    Consulted and Agree with Plan of Care Patient             Patient will benefit  from skilled therapeutic intervention in order to improve the following deficits and impairments:  Abnormal gait, Decreased range of motion, Difficulty walking, Increased fascial restricitons, Increased muscle spasms, Decreased safety awareness, Decreased endurance, Cardiopulmonary status limiting activity, Decreased activity tolerance, Decreased balance, Impaired flexibility, Improper body mechanics, Postural dysfunction, Decreased strength  Visit Diagnosis: Unsteadiness on feet  Muscle weakness (generalized)  Repeated falls  Other abnormalities of gait and mobility     Problem List Patient Active Problem List   Diagnosis Date Noted   Hypertension    Pressure injury of skin 03/15/2021   Intracranial hemorrhage ([place]) 03/14/2021   Lower extremity edema    Polymicrobial bacterial infection    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    Enterococcus faecalis infection    Sepsis due to Escherichia coli ([place])    Debility 02/13/2021   Infection of wound hematoma 02/05/2021   Physical debility 12/22/2020   Tear of left hamstring 12/22/2020   Labral tear of left hip joint 12/22/2020   Acute blood loss anemia    Orthostatic hypotension 12/17/2020   History of CVA (cerebrovascular accident) 12/17/2020   Fall 12/15/2020   Hip hematoma, left, initial encounter 12/15/2020   Nondisplaced fracture of coronoid process of left ulna, initial encounter for closed fracture 12/15/2020   Multiple rib fractures 12/15/2020   COVID-19 virus infection 10/12/2020   Constipation 06/22/2020   Change in bowel habits 06/22/2020   Diverticulosis 06/22/2020   Dark stools 06/22/2020   Hemorrhoids 06/22/2020   Pacemaker 01/23/2020   Junctional rhythm 01/23/2020   Palpitations 01/23/2020   ICH (intracerebral hemorrhage) ([place]) 12/02/2019   A-fib ([name]) 12/02/2019   Anemia 12/02/2019   QT prolongation 12/02/2019   Hypothyroidism 12/02/2019   Gait abnormality 07/10/2018   S/P reverse total shoulder  arthroplasty, 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   Persistent atrial fibrillation ([place])    Bilateral ureteral calculi 11/17/2017   Atrial fibrillation with RVR ([place]) 09/15/2017   Atrial flutter ([place]) 07/17/2017   Port catheter in place 04/02/2017   Non-Hodgkin lymphoma, unspecified, intrathoracic lymph nodes ([place]) 02/13/2017   Mediastinal mass 02/06/2017   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   Abnormal chest x-ray    Lung mass 02/03/2017   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   OSA (obstructive sleep apnea) 02/03/2015   History of renal calculi 11/16/2014   Renal calculi 11/16/2014   Family history of colon cancer 10/26/2014   Mechanical complication due to cardiac pacemaker pulse generator 11/06/2013   Dyspnea 05/12/2013   Hypothyroidism 04/21/2013   Pleural effusion 04/19/2013   Long term (current) use of anticoagulants 12/20/2010   EPIDERMOID CYST 08/22/2010   Chronic diastolic heart failure ([place]) 08/06/2010  SYNCOPE AND COLLAPSE 07/27/2010   OSTEOARTHRITIS, KNEE, RIGHT 03/15/2010   Class 1 obesity due to excess calories with body mass index (BMI) of 33.0 to 33.9 in adult 12/07/2009   NONSPEC ELEVATION OF LEVELS OF TRANSAMINASE/LDH 09/08/2009   BREAST CANCER, HX OF 07/25/2009   COLONIC POLYPS, HX OF 07/25/2009   TUBULOVILLOUS ADENOMA, COLON 04/29/2008   HYPERGLYCEMIA, FASTING 04/29/2008   HYPERLIPIDEMIA 06/22/2007   CARCINOMA, THYROID GLAND, HX OF 06/22/2007    Janene Harvey, PT, DPT 07/30/21 10:16 AM   [facility] [address] [place], [place], [address] Phone: [phone]   Fax:  [phone]  Name: Kayla Harrison MRN: [id number] Date of Birth: March 07, 1946

## 2021-07-31 ENCOUNTER — Encounter: Payer: Self-pay | Admitting: Occupational Therapy

## 2021-07-31 ENCOUNTER — Ambulatory Visit: Payer: Self-pay | Admitting: Physical Therapy

## 2021-08-01 ENCOUNTER — Ambulatory Visit: Payer: Medicare Other | Admitting: Occupational Therapy

## 2021-08-01 ENCOUNTER — Ambulatory Visit: Payer: Medicare Other | Admitting: Physical Therapy

## 2021-08-01 ENCOUNTER — Other Ambulatory Visit: Payer: Self-pay

## 2021-08-01 ENCOUNTER — Encounter: Payer: Self-pay | Admitting: Occupational Therapy

## 2021-08-01 ENCOUNTER — Encounter: Payer: Self-pay | Admitting: Physical Therapy

## 2021-08-01 VITALS — BP 141/102 | HR 101

## 2021-08-01 DIAGNOSIS — M6281 Muscle weakness (generalized): Secondary | ICD-10-CM | POA: Diagnosis not present

## 2021-08-01 DIAGNOSIS — R2689 Other abnormalities of gait and mobility: Secondary | ICD-10-CM | POA: Diagnosis not present

## 2021-08-01 DIAGNOSIS — R41840 Attention and concentration deficit: Secondary | ICD-10-CM | POA: Diagnosis not present

## 2021-08-01 DIAGNOSIS — R296 Repeated falls: Secondary | ICD-10-CM | POA: Diagnosis not present

## 2021-08-01 DIAGNOSIS — R2681 Unsteadiness on feet: Secondary | ICD-10-CM

## 2021-08-01 DIAGNOSIS — H5410 Blindness, one eye, low vision other eye, unspecified eyes: Secondary | ICD-10-CM

## 2021-08-01 IMAGING — CT CT T SPINE W/O CM
3 of 4 series · 9 of 33 positions shown, 11 images · non-contrast
Comparison: 08/13/2019. 04/02/2019. 01/02/2019. Multiple older
studies as distant as 02/17/2018.

CLINICAL DATA: Back pain. Fell 3 weeks ago. Question T12
compression fracture.

EXAM:
CT THORACIC SPINE WITHOUT CONTRAST
TECHNIQUE: Multidetector CT images of the thoracic were obtained using the
standard protocol without intravenous contrast.

[Series 3: t-spine 2.00 br40 s3 · axial · 0.31mm/px · z∈[-880,-880]mm · 1 of 147 slices shown, 2 images]
[im 74/147  soft-tissue]
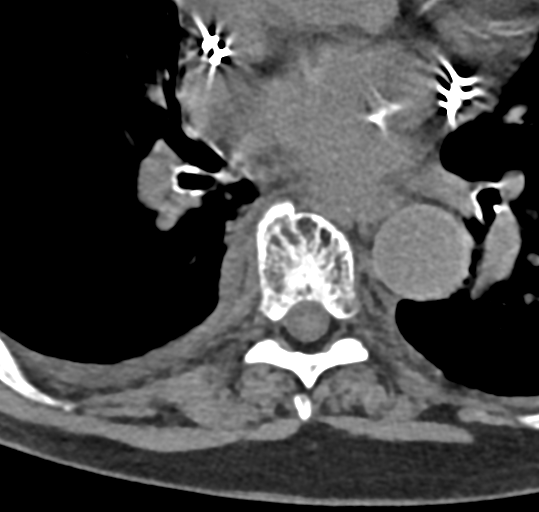
[im 74/147  bone]
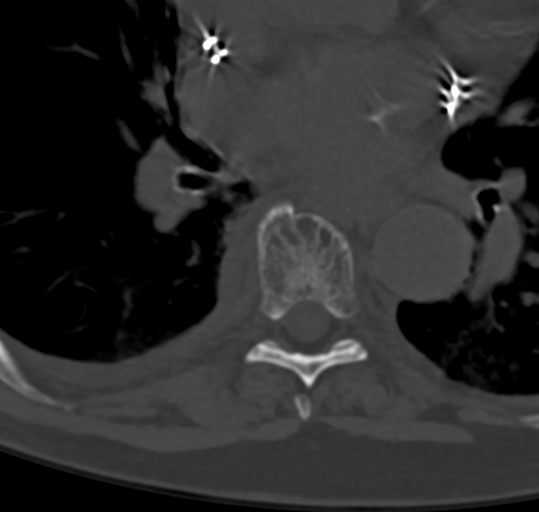

[Series 7: t-spine 2.00 br60 s3 · sagittal · 0.31mm/px · 5 of 68 slices shown, 6 images (1 of 2)]
[im 23/68  bone]
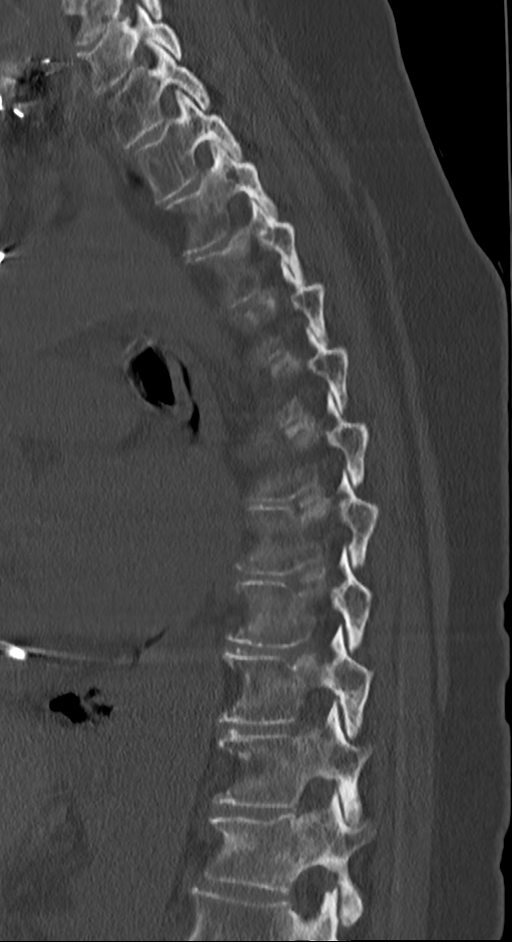
[im 28/68  bone]
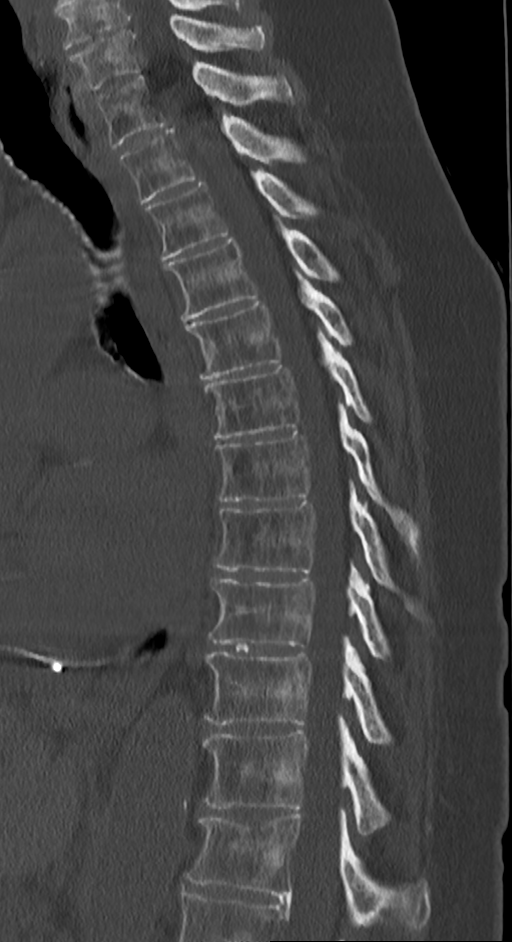
[im 34/68  soft-tissue]
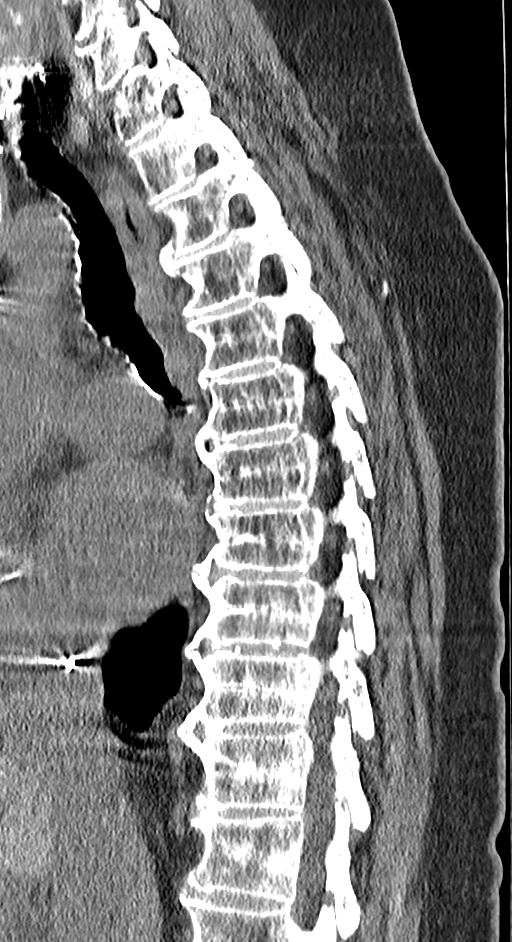
[im 34/68  bone]
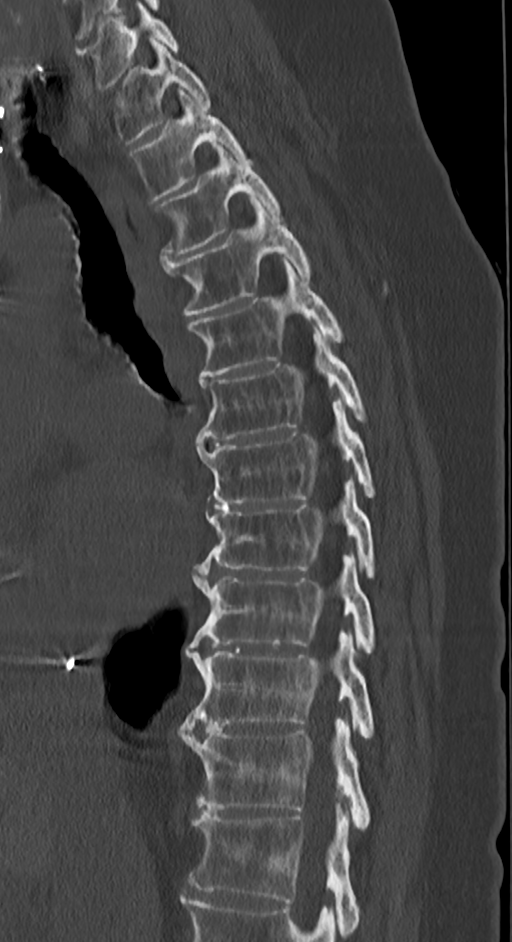
[im 40/68  bone]
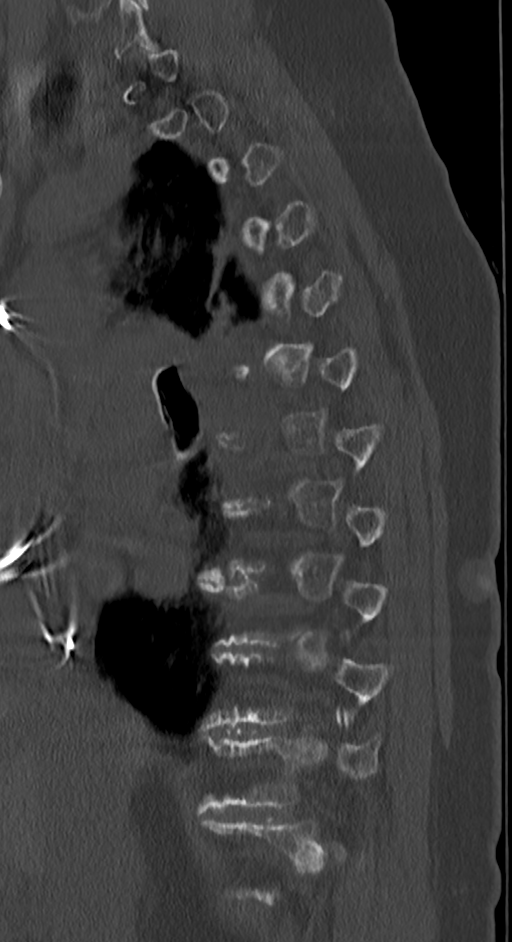
[im 45/68  bone]
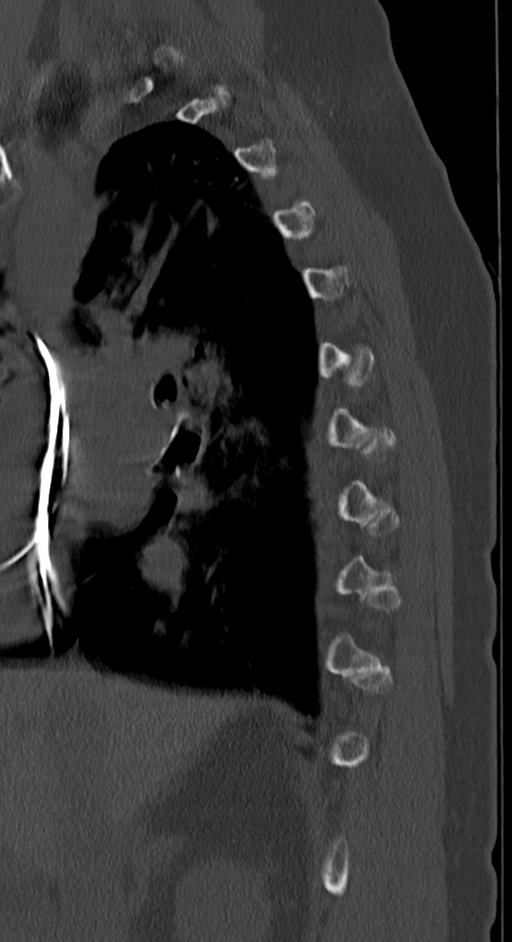

[Series 9: t-spine 2.00 br60 s3 · coronal · 0.28mm/px · 3 of 87 slices shown (2 of 2)]
[im 18/87  bone]
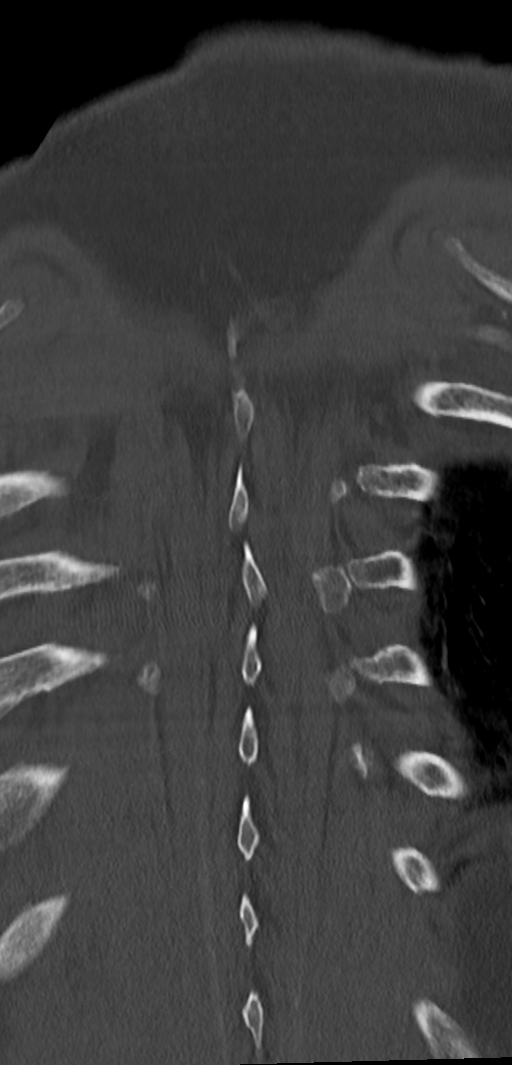
[im 35/87  bone]
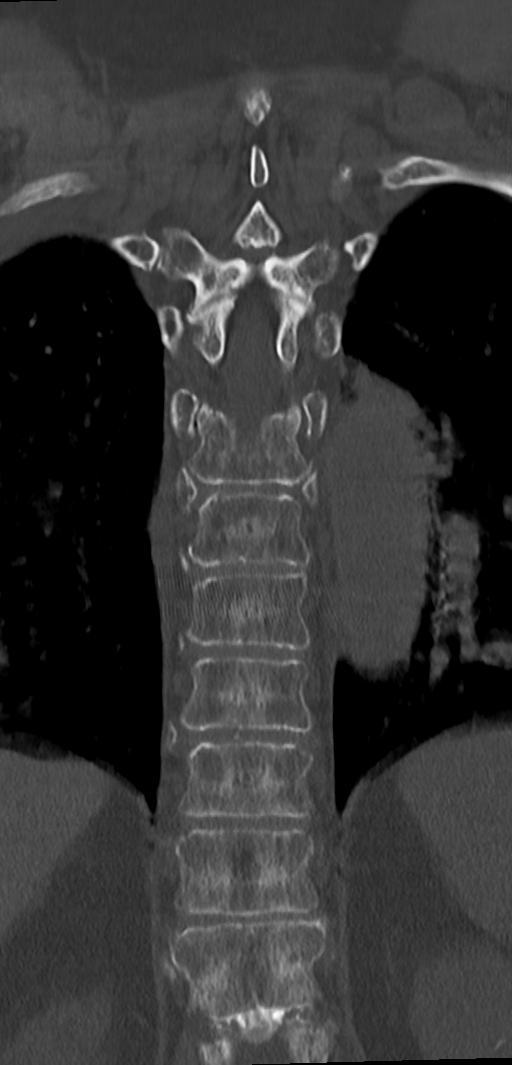
[im 52/87  bone]
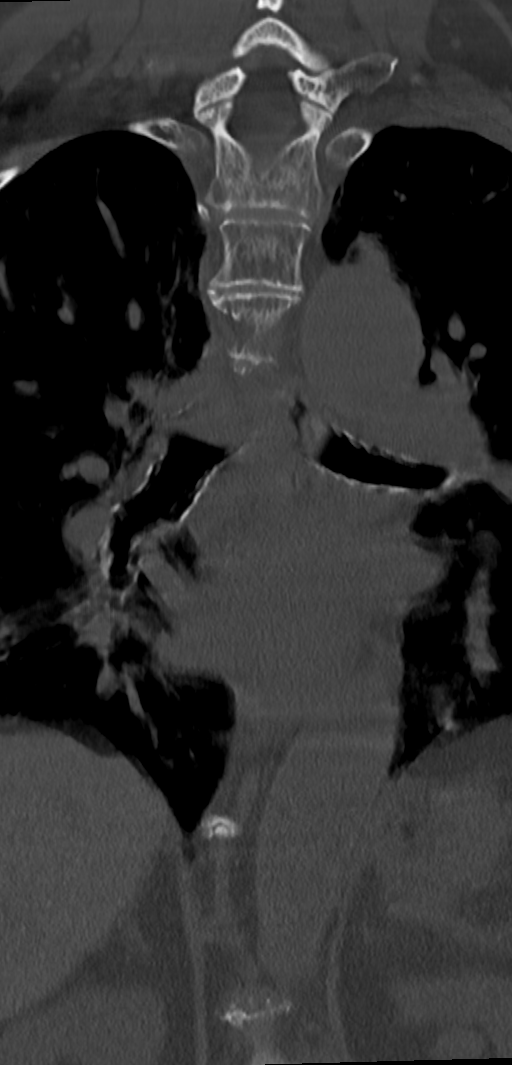

[9 of 33 positions shown; findings below may reference images not displayed]

FINDINGS: Alignment: No malalignment.

Vertebrae: No recent fracture. Old minor superior endplate
depression at T12 with a superior endplate Schmorl's node, unchanged
since the chest CTs from [place] and July 2019.

Paraspinal and other soft tissues: Previous changes of cardiac
surgery. Small amount of layering pleural fluid or pleural
thickening on the right.

Disc levels: No significant disc or facet finding at T10-11 or
above.

At T11-12, there is a small chronic calcified central disc
protrusion that does not have any compressive effect upon the canal
or foramina.

At T12-L1, there is annular bulging with calcification that does not
have any compressive effect upon the canal or foramina.
IMPRESSION: No evidence of recent fracture. Old minor superior endplate
depression at T12 with a superior endplate Schmorl's node, unchanged
since the CT scan [DATE]. This was first evident in the early
stages as a minimal finding on a lumbar CT dated 01/02/2019.

## 2021-08-01 NOTE — Therapy (Signed)
[facility] [address], [place], [place], [address] Phone: [phone]   Fax:  [phone]  Physical Therapy Treatment  Patient Details  Name: Kayla Harrison MRN: [id number] Date of Birth: July 12, 1946 Referring Provider (PT): Garvin Fila, MD   Encounter Date: 08/01/2021   PT End of Session - 08/01/21 1013     Visit Number 5    Number of Visits 17    Date for PT Re-Evaluation 09/12/21    Authorization Type Medicare & MoO    PT Start Time 0932    PT Stop Time 1013    PT Time Calculation (min) 41 min    Activity Tolerance Patient tolerated treatment well;Patient limited by fatigue    Behavior During Therapy [facility] for tasks assessed/performed             Past Medical History:  Diagnosis Date   Anemia    Arthritis    osteoarthritis - knees and right shoulder   Blood transfusion without reported diagnosis    Breast cancer ([place])    Dr Margot Chimes, total thyroidectomy- 1999- for cancer   Brucellosis 1964   Chronic bilateral pleural effusions    Colon polyp    Dr Earlean Shawl   Complication of anesthesia    Ketamine produces LSD reaction, bright colored nightmarish experience    Dyslipidemia    Endometriosis    Fibroids    H/O pleural effusion    s/p thoracentesis w 3286ml withdrawn   Hepatitis    [place] ?hepatitis    History of dysphagia    due to radiation therapy   History of hiatal hernia    small noted on PET scan   Hypertension    Hypothyroidism    Lung cancer, lower lobe ([place]) 01/2017   radiation RX completed 03/04/17; will start chemo 6/27, pt unaware of lung cancer   Morbid obesity ([place])    Status post lap band surgery   Nephrolithiasis    Non Hodgkin's lymphoma ([place])    on chemotherapy   Persistent atrial fibrillation ([place])    a. s/p PVI 2008 b. s/p convergent ablation 3664 complicated by bradycardia requiring pacemaker implant   Personal history of radiation therapy     Presence of permanent cardiac pacemaker    Rotator cuff tear    Right   Stroke ([place])    2003- [place] x2   SVC syndrome    with lung mass and non hodgkins lymphoma   Thyroid cancer ([place]) 2000    Past Surgical History:  Procedure Laterality Date   ABDOMINAL HYSTERECTOMY  1983   afib ablation     a. 2008 PVI b. 2014 convergent ablation   APPENDECTOMY     BONE MARROW BIOPSY  02/21/2017   BREAST LUMPECTOMY Left 2010   [place]     2015- negative   CARDIOVERSION  10/09/2012   Procedure: CARDIOVERSION;  Surgeon: Minus Breeding, MD;  Location: [place];  Service: Cardiovascular;  Laterality: N/A;   CARDIOVERSION  10/09/2012   Procedure: CARDIOVERSION;  Surgeon: Minus Breeding, MD;  Location: [facility] ENDOSCOPY;  Service: Cardiovascular;  Laterality: N/A;  [name] gave the ok to add pt to the add on , but we must check to find out if the can add pt on at [phone])   CARDIOVERSION N/A 11/20/2012   Procedure: CARDIOVERSION;  Surgeon: Fay Records, MD;  Location: [place];  Service:  Cardiovascular;  Laterality: N/A;   CARDIOVERSION N/A 07/18/2017   Procedure: CARDIOVERSION;  Surgeon: Acie Fredrickson Wonda Cheng, MD;  Location: [place];  Service: Cardiovascular;  Laterality: N/A;   CARDIOVERSION N/A 10/03/2017   Procedure: CARDIOVERSION;  Surgeon: Sanda Klein, MD;  Location: [facility] ENDOSCOPY;  Service: Cardiovascular;  Laterality: N/A;   CARDIOVERSION N/A 01/07/2018   Procedure: CARDIOVERSION;  Surgeon: Thayer Headings, MD;  Location: [facility] ENDOSCOPY;  Service: Cardiovascular;  Laterality: N/A;   CARDIOVERSION N/A 12/10/2019   Procedure: CARDIOVERSION;  Surgeon: Buford Dresser, MD;  Location: [facility] ENDOSCOPY;  Service: Cardiovascular;  Laterality: N/A;   CARDIOVERSION N/A 03/09/2021   Procedure: CARDIOVERSION;  Surgeon: Pixie Casino, MD;  Location: [place];  Service: Cardiovascular;  Laterality: N/A;   CHOLECYSTECTOMY     COLONOSCOPY W/ POLYPECTOMY     Dr Earlean Shawl    CYSTOSCOPY N/A 02/06/2015   Procedure: CYSTOSCOPY;  Surgeon: Kathie Rhodes, MD;  Location: [facility];  Service: Urology;  Laterality: N/A;   CYSTOSCOPY W/ RETROGRADES Left 11/17/2017   Procedure: CYSTOSCOPY WITH RETROGRADE /PYELOGRAM/;  Surgeon: Kathie Rhodes, MD;  Location: [facility];  Service: Urology;  Laterality: Left;   CYSTOSCOPY WITH RETROGRADE PYELOGRAM, URETEROSCOPY AND STENT PLACEMENT Right 02/06/2015   Procedure: RETROGRADE PYELOGRAM, RIGHT URETEROSCOPY STENT PLACEMENT;  Surgeon: Kathie Rhodes, MD;  Location: [facility];  Service: Urology;  Laterality: Right;   CYSTOSCOPY WITH RETROGRADE PYELOGRAM, URETEROSCOPY AND STENT PLACEMENT Right 03/07/2017   Procedure: CYSTOSCOPY WITH RIGHT RETROGRADE PYELOGRAM,RIGHT  URETEROSCOPYLASER LITHOTRIPSY  AND STENT PLACEMENT AND STONE BASKETRY;  Surgeon: Kathie Rhodes, MD;  Location: [place];  Service: Urology;  Laterality: Right;   EYE SURGERY     cataract surgery   fatty mass removal  1999   pubic area   HOLMIUM LASER APPLICATION N/A 2/95/7473   Procedure: HOLMIUM LASER APPLICATION;  Surgeon: Kathie Rhodes, MD;  Location: [facility];  Service: Urology;  Laterality: N/A;   HOLMIUM LASER APPLICATION Right 12/25/7094   Procedure: HOLMIUM LASER APPLICATION;  Surgeon: Kathie Rhodes, MD;  Location: [facility];  Service: Urology;  Laterality: Right;   HOLMIUM LASER APPLICATION Left 4/38/3818   Procedure: HOLMIUM LASER APPLICATION;  Surgeon: Kathie Rhodes, MD;  Location: [facility];  Service: Urology;  Laterality: Left;   I & D EXTREMITY Left 12/19/2020   Procedure: IRRIGATION AND DEBRIDEMENT OF LEFT HIP HEMATOMA WITH APPLICATION OF WOUND VAC;  Surgeon: Erle Crocker, MD;  Location: [place];  Service: Orthopedics;  Laterality: Left;   I & D EXTREMITY Left 02/06/2021   Procedure: IRRIGATION AND DEBRIDEMENT DEEP ABCESS LEFT THIGH, SECONDARY CLOSURE OF WOUND DEHISCENCE;  Surgeon: Erle Crocker, MD;  Location: [place];  Service:  Orthopedics;  Laterality: Left;   IR FLUORO GUIDE PORT INSERTION RIGHT  02/24/2017   IR NEPHROSTOMY PLACEMENT RIGHT  11/17/2017   IR PATIENT EVAL TECH 0-60 MINS  03/11/2017   IR REMOVAL TUN ACCESS W/ PORT W/O FL MOD SED  04/20/2018   IR US GUIDE VASC ACCESS RIGHT  02/24/2017   KNEE ARTHROSCOPY     bilateral   LAPAROSCOPIC GASTRIC BANDING  07/10/2010   LAPAROSCOPIC GASTRIC BANDING     Laparoscopic adjustable banding APS System with posterior hiatal hernia, 2 suture.   LAPAROTOMY     for ruptured ovary and ovarian artery    NEPHROLITHOTOMY Right 11/17/2017   Procedure: NEPHROLITHOTOMY PERCUTANEOUS;  Surgeon: Kathie Rhodes, MD;  Location: [facility];  Service: Urology;  Laterality: Right;   PACEMAKER INSERTION  03/10/2013  MDT dual chamber PPM   POCKET REVISION N/A 12/08/2013   Procedure: POCKET REVISION;  Surgeon: Deboraha Sprang, MD;  Location: [facility] CATH LAB;  Service: Cardiovascular;  Laterality: N/A;   PORTA CATH INSERTION     REVERSE SHOULDER ARTHROPLASTY Right 05/14/2018   Procedure: RIGHT REVERSE SHOULDER ARTHROPLASTY;  Surgeon: Tania Ade, MD;  Location: [place];  Service: Orthopedics;  Laterality: Right;   REVERSE SHOULDER REPLACEMENT Right 05/14/2018   RIGHT HEART CATH N/A 07/21/2019   Procedure: RIGHT HEART CATH;  Surgeon: Larey Dresser, MD;  Location: [place] CV LAB;  Service: Cardiovascular;  Laterality: N/A;   TEE WITH CARDIOVERSION  09/22/2017   TEE WITHOUT CARDIOVERSION N/A 10/03/2017   Procedure: TRANSESOPHAGEAL ECHOCARDIOGRAM (TEE);  Surgeon: Sanda Klein, MD;  Location: [facility] ENDOSCOPY;  Service: Cardiovascular;  Laterality: N/A;   TEE WITHOUT CARDIOVERSION N/A 08/04/2019   Procedure: TRANSESOPHAGEAL ECHOCARDIOGRAM (TEE);  Surgeon: Larey Dresser, MD;  Location: [facility] ENDOSCOPY;  Service: Cardiovascular;  Laterality: N/A;   TEE WITHOUT CARDIOVERSION N/A 12/10/2019   Procedure: TRANSESOPHAGEAL ECHOCARDIOGRAM (TEE);  Surgeon: Buford Dresser, MD;  Location: [facility] ENDOSCOPY;   Service: Cardiovascular;  Laterality: N/A;   TEE WITHOUT CARDIOVERSION N/A 03/09/2021   Procedure: TRANSESOPHAGEAL ECHOCARDIOGRAM (TEE);  Surgeon: Pixie Casino, MD;  Location: [place];  Service: Cardiovascular;  Laterality: N/A;   THYROIDECTOMY  1998   Dr Margot Chimes   TONSILLECTOMY     TOTAL KNEE ARTHROPLASTY  04/13/2012   Procedure: TOTAL KNEE ARTHROPLASTY;  Surgeon: Rudean Haskell, MD;  Location: [place];  Service: Orthopedics;  Laterality: Right;   VIDEO BRONCHOSCOPY WITH ENDOBRONCHIAL ULTRASOUND N/A 02/07/2017   Procedure: VIDEO BRONCHOSCOPY WITH ENDOBRONCHIAL ULTRASOUND;  Surgeon: Marshell Garfinkel, MD;  Location: [place];  Service: Pulmonary;  Laterality: N/A;    Vitals:   08/01/21 0934 08/01/21 0952  BP: (!) 123/100 (!) 141/102  Pulse: 100 (!) 101     Subjective Assessment - 08/01/21 0936     Subjective Having a much better day today.    Pertinent History anemia, breast CA s/p surgery, lung CA s/p radiation and chemo, lymphoma on chemo, thyroid CA, HLD, hiatal hernia, HTN, a-fib, pacemaker, R reverse TSA 2019, stroke 2003, SVC syndrome, R TKA 2013    Diagnostic tests 03/15/21 head CT: Similar appearance of left caudothalamic hemorrhage with  intraventricular extension and mild adjacent edema. No new  hemorrhage. Unchanged mild trapping of the left lateral ventricle    Patient Stated Goals be able to get up off the floor    Currently in Pain? No/denies                               [facility] Adult PT Treatment/Exercise - 08/01/21 0001       Neuro Re-ed    Neuro Re-ed Details  R/L and ant/pos wt shift on foam at II bars;R/L step up on 4" step with 2/1 UE support on II bar; alt toe tap on 4" step wiht 2 finger support on II bar      Knee/Hip Exercises: Aerobic   Nustep L4 x 6 min (UEs/LEs)      Knee/Hip Exercises: Seated   Sit to Sand 2 sets;5 reps;without UE support   red medball at chest; 1st set sitting on airex                    PT Education  - 08/01/21 1013     Education Details  encouraged ot perform STS without UEs at home    Person(s) Educated Patient    Methods Explanation    Comprehension Verbalized understanding;Returned demonstration              PT Short Term Goals - 07/27/21 1011       PT SHORT TERM GOAL #1   Title Patient to be independent with initial HEP.    Time 3    Period Weeks    Status Achieved    Target Date 08/08/21               PT Long Term Goals - 07/25/21 1224       PT LONG TERM GOAL #1   Title Patient to be independent with advanced HEP.    Time 8    Period Weeks    Status On-going      PT LONG TERM GOAL #2   Title Patient to demonstrate B LE strength >/=4+/5.    Time 8    Period Weeks    Status On-going      PT LONG TERM GOAL #3   Title Patient to score at least 46/56 on [place] in order to decrease risk of falls.    Time 8    Period Weeks    Status On-going      PT LONG TERM GOAL #4   Title Patient to complete TUG in <14 sec with LRAD in order to decrease risk of falls.    Time 8    Period Weeks    Status On-going      PT LONG TERM GOAL #5   Title Patient to demonstrate 5xSTS test in <15 sec in order to decrease risk of falls.    Time 8    Period Weeks    Status On-going      PT LONG TERM GOAL #6   Title Patient to demonstrate safe floor transfer with mod I.    Time 8    Period Weeks    Status On-going                   Plan - 08/01/21 1014     Clinical Impression Statement Patient arrived to session with report of feeling better than last session. Diastolic BP appeared elevated at start of session, however patient asymptomatic. Proceeded with session while monitoring symptoms. Continued working on STS transfers without UE support. Patient able to perform these transfers without elevated seat. Still requires cues to scoot to EO chair and encourage anterior trunk lean. Dynamic balance activities focused on improving comfort with weight shifting.  Patient with tendency to use reaching strategy with posterior LOB d/t difficulty correcting. Step ups required increased UE support on R vs. L LE d/t R LE weakness. Improved stability and confidence demonstrated with toe taps today. No complaints at end of session. Patient progressing well towards goals.    Comorbidities anemia, breast CA s/p surgery, lung CA s/p radiation and chemo, lymphoma on chemo, thyroid CA, HLD, hiatal hernia, HTN, a-fib, pacemaker, R reverse TSA 2019, stroke 2003, SVC syndrome, R TKA 2013    PT Treatment/Interventions ADLs/Self Care Home Management;Cryotherapy;Electrical Stimulation;DME Instruction;Moist Heat;Gait training;Stair training;Functional mobility training;Therapeutic activities;Therapeutic exercise;Balance training;Neuromuscular re-education;Manual techniques;Patient/family education;Passive range of motion;Dry needling;Energy conservation;Vestibular;Vasopneumatic Device;Taping    PT Next Visit Plan balance training with SLS activities; progress LE stretching and strengthening to improve posture and gait deviations; continue gait training with SPC    Consulted and Agree with Plan of Care Patient  Patient will benefit from skilled therapeutic intervention in order to improve the following deficits and impairments:  Abnormal gait, Decreased range of motion, Difficulty walking, Increased fascial restricitons, Increased muscle spasms, Decreased safety awareness, Decreased endurance, Cardiopulmonary status limiting activity, Decreased activity tolerance, Decreased balance, Impaired flexibility, Improper body mechanics, Postural dysfunction, Decreased strength  Visit Diagnosis: Unsteadiness on feet  Muscle weakness (generalized)  Repeated falls  Other abnormalities of gait and mobility     Problem List Patient Active Problem List   Diagnosis Date Noted   Hypertension    Pressure injury of skin 03/15/2021   Intracranial hemorrhage (HCC)  03/14/2021   Lower extremity edema    Polymicrobial bacterial infection    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    Enterococcus faecalis infection    Sepsis due to Escherichia coli ([place])    Debility 02/13/2021   Infection of wound hematoma 02/05/2021   Physical debility 12/22/2020   Tear of left hamstring 12/22/2020   Labral tear of left hip joint 12/22/2020   Acute blood loss anemia    Orthostatic hypotension 12/17/2020   History of CVA (cerebrovascular accident) 12/17/2020   Fall 12/15/2020   Hip hematoma, left, initial encounter 12/15/2020   Nondisplaced fracture of coronoid process of left ulna, initial encounter for closed fracture 12/15/2020   Multiple rib fractures 12/15/2020   COVID-19 virus infection 10/12/2020   Constipation 06/22/2020   Change in bowel habits 06/22/2020   Diverticulosis 06/22/2020   Dark stools 06/22/2020   Hemorrhoids 06/22/2020   Pacemaker 01/23/2020   Junctional rhythm 01/23/2020   Palpitations 01/23/2020   ICH (intracerebral hemorrhage) ([place]) 12/02/2019   A-fib ([place]) 12/02/2019   Anemia 12/02/2019   QT prolongation 12/02/2019   Hypothyroidism 12/02/2019   Gait abnormality 07/10/2018   S/P reverse total shoulder arthroplasty, 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   Persistent atrial fibrillation ([place])    Bilateral ureteral calculi 11/17/2017   Atrial fibrillation with RVR ([place]) 09/15/2017   Atrial flutter ([place]) 07/17/2017   Port catheter in place 04/02/2017   Non-Hodgkin lymphoma, unspecified, intrathoracic lymph nodes ([place]) 02/13/2017   Mediastinal mass 02/06/2017   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   Abnormal chest x-ray    Lung mass 02/03/2017   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   OSA (obstructive sleep apnea) 02/03/2015   History of renal calculi 11/16/2014   Renal calculi 11/16/2014   Family history of colon cancer 10/26/2014   Mechanical complication due to cardiac pacemaker pulse generator 11/06/2013   Dyspnea 05/12/2013    Hypothyroidism 04/21/2013   Pleural effusion 04/19/2013   Long term (current) use of anticoagulants 12/20/2010   EPIDERMOID CYST 08/22/2010   Chronic diastolic heart failure ([place]) 08/06/2010   SYNCOPE AND COLLAPSE 07/27/2010   OSTEOARTHRITIS, KNEE, RIGHT 03/15/2010   Class 1 obesity due to excess calories with body mass index (BMI) of 33.0 to 33.9 in adult 12/07/2009   NONSPEC ELEVATION OF LEVELS OF TRANSAMINASE/LDH 09/08/2009   BREAST CANCER, HX OF 07/25/2009   COLONIC POLYPS, HX OF 07/25/2009   TUBULOVILLOUS ADENOMA, COLON 04/29/2008   HYPERGLYCEMIA, FASTING 04/29/2008   HYPERLIPIDEMIA 06/22/2007   CARCINOMA, THYROID GLAND, HX OF 06/22/2007    Janene Harvey, PT, DPT 08/01/21 10:15 AM   [facility] [address], [place], [place], [address] Phone: [phone]   Fax:  [phone]  Name: Kayla Harrison MRN: [id number] Date of Birth: 1946-02-28

## 2021-08-01 NOTE — Therapy (Signed)
[facility], [place], [place], [address] Phone: [phone]   Fax:  [phone]  Occupational Therapy Treatment  Patient Details  Name: Kayla Harrison MRN: [id number] Date of Birth: 1946-02-02 Referring Provider (OT): Leonie Man   Encounter Date: 08/01/2021    Past Medical History:  Diagnosis Date   Anemia    Arthritis    osteoarthritis - knees and right shoulder   Blood transfusion without reported diagnosis    Breast cancer ([place])    Dr Margot Chimes, total thyroidectomy- 1999- for cancer   Brucellosis 1964   Chronic bilateral pleural effusions    Colon polyp    Dr Earlean Shawl   Complication of anesthesia    Ketamine produces LSD reaction, bright colored nightmarish experience    Dyslipidemia    Endometriosis    Fibroids    H/O pleural effusion    s/p thoracentesis w 329m withdrawn   Hepatitis    [place] ?hepatitis    History of dysphagia    due to radiation therapy   History of hiatal hernia    small noted on PET scan   Hypertension    Hypothyroidism    Lung cancer, lower lobe ([place] 01[place]   radiation RX completed 03/04/17; will start chemo 6/27, pt unaware of lung cancer   Morbid obesity ([place]    Status post lap band surgery   Nephrolithiasis    Non Hodgkin's lymphoma ([facility]    on chemotherapy   Persistent atrial fibrillation ([place]    a. s/p PVI 2008 b. s/p convergent ablation 24656complicated by bradycardia requiring pacemaker implant   Personal history of radiation therapy    Presence of permanent cardiac pacemaker    Rotator cuff tear    Right   [place]   SVC syndrome    with lung mass and non hodgkins lymphoma   Thyroid cancer ([place] 2000    Past Surgical History:  Procedure Laterality Date   ABDOMINAL HYSTERECTOMY  1983   afib ablation     a. 2008 PVI b. 2014 convergent ablation   APPENDECTOMY     BONE MARROW BIOPSY  02/21/2017    BREAST LUMPECTOMY Left 2010   [place]    2015- negative   CARDIOVERSION  10/09/2012   Procedure: CARDIOVERSION;  Surgeon: JMinus Breeding MD;  [place] [facility]  Service: Cardiovascular;  Laterality: N/A;   CARDIOVERSION  10/09/2012   Procedure: CARDIOVERSION;  Surgeon: JMinus Breeding MD;  [facility]  Service: Cardiovascular;  Laterality: N/A;  [name] the ok to add pt to the add on , but we must check to find out if the can add pt on at 1400 ([phone]   CARDIOVERSION N/A 11/20/2012   Procedure: CARDIOVERSION;  Surgeon: PFay Records MD;  [place]  Service: Cardiovascular;  Laterality: N/A;   CARDIOVERSION N/A 07/18/2017   Procedure: CARDIOVERSION;  Surgeon: NThayer Headings MD;  [facility]  Service: Cardiovascular;  Laterality: N/A;   CARDIOVERSION N/A 10/03/2017   Procedure: CARDIOVERSION;  Surgeon: CSanda Klein MD;  [facility]  Service: Cardiovascular;  Laterality: N/A;   CARDIOVERSION N/A 01/07/2018   Procedure: CARDIOVERSION;  Surgeon: NAcie FredricksonPWonda Cheng MD;  [facility]  Service: Cardiovascular;  Laterality: N/A;   CARDIOVERSION N/A 12/10/2019   Procedure: CARDIOVERSION;  Surgeon: CBuford Dresser MD;  [facility]  Service: Cardiovascular;  Laterality: N/A;   CARDIOVERSION N/A 03/09/2021   Procedure: CARDIOVERSION;  Surgeon: Pixie Casino, MD;  Location: [place];  Service: Cardiovascular;  Laterality: N/A;   CHOLECYSTECTOMY     COLONOSCOPY W/ POLYPECTOMY     Dr Earlean Shawl   CYSTOSCOPY N/A 02/06/2015   Procedure: CYSTOSCOPY;  Surgeon: Kathie Rhodes, MD;  [facility];  Service: Urology;  Laterality: N/A;   CYSTOSCOPY W/ RETROGRADES Left 11/17/2017   Procedure: CYSTOSCOPY WITH RETROGRADE /PYELOGRAM/;  Surgeon: Kathie Rhodes, MD;  [facility];  Service: Urology;  Laterality: Left;   CYSTOSCOPY WITH RETROGRADE PYELOGRAM, URETEROSCOPY AND STENT PLACEMENT Right  02/06/2015   Procedure: RETROGRADE PYELOGRAM, RIGHT URETEROSCOPY STENT PLACEMENT;  Surgeon: Kathie Rhodes, MD;  [facility];  Service: Urology;  Laterality: Right;   CYSTOSCOPY WITH RETROGRADE PYELOGRAM, URETEROSCOPY AND STENT PLACEMENT Right 03/07/2017   Procedure: CYSTOSCOPY WITH RIGHT RETROGRADE PYELOGRAM,RIGHT  URETEROSCOPYLASER LITHOTRIPSY  AND STENT PLACEMENT AND STONE BASKETRY;  Surgeon: Kathie Rhodes, MD;  Location: [place];  Service: Urology;  Laterality: Right;   EYE SURGERY     cataract surgery   fatty mass removal  1999   pubic area   HOLMIUM LASER APPLICATION N/A 2/35/3614   Procedure: HOLMIUM LASER APPLICATION;  Surgeon: Kathie Rhodes, MD;  [facility];  Service: Urology;  Laterality: N/A;   HOLMIUM LASER APPLICATION Right 4/31/5400   Procedure: HOLMIUM LASER APPLICATION;  Surgeon: Kathie Rhodes, MD;  Location: [facility];  Service: Urology;  Laterality: Right;   HOLMIUM LASER APPLICATION Left 8/67/6195   Procedure: HOLMIUM LASER APPLICATION;  Surgeon: Kathie Rhodes, MD;  [facility];  Service: Urology;  Laterality: Left;   I & D EXTREMITY Left 12/19/2020   Procedure: IRRIGATION AND DEBRIDEMENT OF LEFT HIP HEMATOMA WITH APPLICATION OF WOUND VAC;  Surgeon: Erle Crocker, MD;  Location: [place];  Service: Orthopedics;  Laterality: Left;   I & D EXTREMITY Left 02/06/2021   Procedure: IRRIGATION AND DEBRIDEMENT DEEP ABCESS LEFT THIGH, SECONDARY CLOSURE OF WOUND DEHISCENCE;  Surgeon: Erle Crocker, MD;  Location: [place];  Service: Orthopedics;  Laterality: Left;   IR FLUORO GUIDE PORT INSERTION RIGHT  02/24/2017   IR NEPHROSTOMY PLACEMENT RIGHT  11/17/2017   IR PATIENT EVAL TECH 0-60 MINS  03/11/2017   IR REMOVAL TUN ACCESS W/ PORT W/O FL MOD SED  04/20/2018   IR US GUIDE VASC ACCESS RIGHT  02/24/2017   KNEE ARTHROSCOPY     bilateral   LAPAROSCOPIC GASTRIC BANDING  07/10/2010   LAPAROSCOPIC GASTRIC BANDING     Laparoscopic adjustable  banding APS System with posterior hiatal hernia, 2 suture.   LAPAROTOMY     for ruptured ovary and ovarian artery    NEPHROLITHOTOMY Right 11/17/2017   Procedure: NEPHROLITHOTOMY PERCUTANEOUS;  Surgeon: Kathie Rhodes, MD;  [facility];  Service: Urology;  Laterality: Right;   PACEMAKER INSERTION  03/10/2013   MDT dual chamber PPM   POCKET REVISION N/A 12/08/2013   Procedure: POCKET REVISION;  Surgeon: Deboraha Sprang, MD;  Location[facility] LAB;  Service: Cardiovascular;  Laterality: N/A;   PORTA CATH INSERTION     REVERSE SHOULDER ARTHROPLASTY Right 05/14/2018   Procedure: RIGHT REVERSE SHOULDER ARTHROPLASTY;  Surgeon: Tania Ade, MD;  Location: [place];  Service: Orthopedics;  Laterality: Right;   REVERSE SHOULDER REPLACEMENT Right 05/14/2018   RIGHT HEART CATH N/A 07/21/2019   Procedure: RIGHT HEART CATH;  Surgeon: Larey Dresser, MD;  Location:  [place] INVASIVE CV LAB;  Service: Cardiovascular;  Laterality: N/A;   TEE WITH CARDIOVERSION  09/22/2017   TEE WITHOUT CARDIOVERSION N/A 10/03/2017   Procedure: TRANSESOPHAGEAL ECHOCARDIOGRAM (TEE);  Surgeon: Sanda Klein, MD;  Location[facility];  Service: Cardiovascular;  Laterality: N/A;   TEE WITHOUT CARDIOVERSION N/A 08/04/2019   Procedure: TRANSESOPHAGEAL ECHOCARDIOGRAM (TEE);  Surgeon: Larey Dresser, MD;  Location: [facility]OSCOPY;  Service: Cardiovascular;  Laterality: N/A;   TEE WITHOUT CARDIOVERSION N/A 12/10/2019   Procedure: TRANSESOPHAGEAL ECHOCARDIOGRAM (TEE);  Surgeon: Buford Dresser, MD;  Location: [facility];  Service: Cardiovascular;  Laterality: N/A;   TEE WITHOUT CARDIOVERSION N/A 03/09/2021   Procedure: TRANSESOPHAGEAL ECHOCARDIOGRAM (TEE);  Surgeon: Pixie Casino, MD;  Location: [place];  Service: Cardiovascular;  Laterality: N/A;   THYROIDECTOMY  1998   Dr Margot Chimes   TONSILLECTOMY     TOTAL KNEE ARTHROPLASTY  04/13/2012   Procedure: TOTAL KNEE ARTHROPLASTY;  Surgeon: Rudean Haskell, MD;  Location:  [place];  Service: Orthopedics;  Laterality: Right;   VIDEO BRONCHOSCOPY WITH ENDOBRONCHIAL ULTRASOUND N/A 02/07/2017   Procedure: VIDEO BRONCHOSCOPY WITH ENDOBRONCHIAL ULTRASOUND;  Surgeon: Marshell Garfinkel, MD;  Location: [place];  Service: Pulmonary;  Laterality: N/A;    There were no vitals filed for this visit.   Subjective Assessment - 08/01/21 1020     Subjective  Pt reports sleeping better over the last 2 nights.    Pertinent History h/o hyperlipidemia, atrial fibrillation with pacemaker on Eliquis, lymphoma, confusion, subcortical hemorrhage on the left with intraventricular extension    Currently in Pain? No/denies                [facility] tasks in sitting progress to standing with sorting cards.  Pt reports fatigue in hands after 3-4 minutes.  Increased challenge to incorporate standing with peg board activity.  Pt tolerating standing 7 mins before requesting seated rest break.  Pt able to return to standing for ~4 mins before needing to sit again.  Peg board placed at 120* to increase challenge with functional reach.  Pt did not drop any pegs during initial 7 mins, but then with increased dropping of pegs as she fatigued.    Scavenger hunt to locate numbers 1-5 in numerical order.  Pt with 3 LOB backwards, able to correct without assistance only guarding.  Mod cues to take rest breaks as needed as pt determined to complete task.  Educated on increased safety awareness and recommendation to sit when needed to reduce falls.    Discussed carryover of tasks to home making tasks and handwriting. Pt continues to report fatigue in UE and decreased endurance, requiring cues to take rest breaks as needed.                    OT Short Term Goals - 07/25/21 1022       OT SHORT TERM GOAL #1   Title Pt will verbalize understanding of 3 compensatory strategies for low vision.    Time 4    Period Weeks    Status On-going    Target Date 08/15/21      OT SHORT TERM GOAL #2    Title Pt will improve hand strength and endurance to improve handwriting legiblity to 90%    Time 4    Period Weeks    Status On-going      OT SHORT TERM GOAL #3   Title Pt will demonstrate improved activity tolerance to complete standing acitivity for 15 mins without  seated rest break.    Time 4    Period Weeks    Status On-going      OT SHORT TERM GOAL #4   Title Pt will improve shoulder ROM to 140* in RUE to increase functional reach in to cabinets    Time 4    Period Weeks    Status On-going               OT Long Term Goals - 07/25/21 1022       OT LONG TERM GOAL #1   Title Pt will be independent in energy conservation strategies.    Time 8    Period Weeks    Status On-going      OT LONG TERM GOAL #2   Title Pt will be independent in modifications for low vision.    Time 8    Period Weeks    Status On-going      OT LONG TERM GOAL #3   Title Pt wil be able to complete housekeeping task incorporating reaching outside BOS without reports of dizziness.    Time 8    Period Weeks    Status On-going      OT LONG TERM GOAL #4   Title Pt will be able to complete dual task activity without cues for sequencing and with improved safety awareness.    Time 8    Period Weeks    Status On-going                    Patient will benefit from skilled therapeutic intervention in order to improve the following deficits and impairments:           Visit Diagnosis: No diagnosis found.    Problem List Patient Active Problem List   Diagnosis Date Noted   Hypertension    Pressure injury of skin 03/15/2021   Intracranial hemorrhage ([place]) 03/14/2021   Lower extremity edema    Polymicrobial bacterial infection    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    Enterococcus faecalis infection    Sepsis due to Escherichia coli ([place])    Debility 02/13/2021   Infection of wound hematoma 02/05/2021   Physical debility 12/22/2020   Tear of left hamstring 12/22/2020    Labral tear of left hip joint 12/22/2020   Acute blood loss anemia    Orthostatic hypotension 12/17/2020   History of CVA (cerebrovascular accident) 12/17/2020   Fall 12/15/2020   Hip hematoma, left, initial encounter 12/15/2020   Nondisplaced fracture of coronoid process of left ulna, initial encounter for closed fracture 12/15/2020   Multiple rib fractures 12/15/2020   COVID-19 virus infection 10/12/2020   Constipation 06/22/2020   Change in bowel habits 06/22/2020   Diverticulosis 06/22/2020   Dark stools 06/22/2020   Hemorrhoids 06/22/2020   Pacemaker 01/23/2020   Junctional rhythm 01/23/2020   Palpitations 01/23/2020   ICH (intracerebral hemorrhage) ([place]) 12/02/2019   A-fib ([place]) 12/02/2019   Anemia 12/02/2019   QT prolongation 12/02/2019   Hypothyroidism 12/02/2019   Gait abnormality 07/10/2018   S/P reverse total shoulder arthroplasty, 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   Persistent atrial fibrillation ([place])    Bilateral ureteral calculi 11/17/2017   Atrial fibrillation with RVR ([place]) 09/15/2017   Atrial flutter ([place]) 07/17/2017   Port catheter in place 04/02/2017   Non-Hodgkin lymphoma, unspecified, intrathoracic lymph nodes ([place]) 02/13/2017   Mediastinal mass 02/06/2017   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[place]) 02/06/2017   Abnormal chest x-ray    Lung mass 02/03/2017  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   OSA (obstructive sleep apnea) 02/03/2015   History of renal calculi 11/16/2014   Renal calculi 11/16/2014   Family history of colon cancer 10/26/2014   Mechanical complication due to cardiac pacemaker pulse generator 11/06/2013   Dyspnea 05/12/2013   Hypothyroidism 04/21/2013   Pleural effusion 04/19/2013   Long term (current) use of anticoagulants 12/20/2010   EPIDERMOID CYST 08/22/2010   Chronic diastolic heart failure ([place]) 08/06/2010   SYNCOPE AND COLLAPSE 07/27/2010   OSTEOARTHRITIS, KNEE, RIGHT 03/15/2010   Class 1 obesity due to excess calories with body mass index  (BMI) of 33.0 to 33.9 in adult 12/07/2009   NONSPEC ELEVATION OF LEVELS OF TRANSAMINASE/LDH 09/08/2009   BREAST CANCER, HX OF 07/25/2009   COLONIC POLYPS, HX OF 07/25/2009   TUBULOVILLOUS ADENOMA, COLON 04/29/2008   HYPERGLYCEMIA, FASTING 04/29/2008   HYPERLIPIDEMIA 06/22/2007   CARCINOMA, THYROID GLAND, HX OF 06/22/2007    Simonne Come, OT/L 08/01/2021, 10:21 AM  [facility] [address], [place], [place], [address] Phone: [phone]   Fax:  [phone]  Name: Kayla Harrison MRN: [id number] Date of Birth: [birth year]

## 2021-08-06 ENCOUNTER — Ambulatory Visit: Payer: Medicare Other | Admitting: Occupational Therapy

## 2021-08-06 ENCOUNTER — Ambulatory Visit: Payer: Medicare Other | Admitting: Physical Therapy

## 2021-08-07 DIAGNOSIS — R0981 Nasal congestion: Secondary | ICD-10-CM | POA: Diagnosis not present

## 2021-08-07 DIAGNOSIS — I480 Paroxysmal atrial fibrillation: Secondary | ICD-10-CM | POA: Diagnosis not present

## 2021-08-07 DIAGNOSIS — R112 Nausea with vomiting, unspecified: Secondary | ICD-10-CM | POA: Diagnosis not present

## 2021-08-07 DIAGNOSIS — Z1152 Encounter for screening for COVID-19: Secondary | ICD-10-CM | POA: Diagnosis not present

## 2021-08-07 DIAGNOSIS — R051 Acute cough: Secondary | ICD-10-CM | POA: Diagnosis not present

## 2021-08-07 DIAGNOSIS — J432 Centrilobular emphysema: Secondary | ICD-10-CM | POA: Diagnosis not present

## 2021-08-07 DIAGNOSIS — R5383 Other fatigue: Secondary | ICD-10-CM | POA: Diagnosis not present

## 2021-08-07 DIAGNOSIS — C884 Extranodal marginal zone B-cell lymphoma of mucosa-associated lymphoid tissue [MALT-lymphoma]: Secondary | ICD-10-CM | POA: Diagnosis not present

## 2021-08-07 DIAGNOSIS — G4733 Obstructive sleep apnea (adult) (pediatric): Secondary | ICD-10-CM | POA: Diagnosis not present

## 2021-08-07 IMAGING — MG DIGITAL SCREENING BILAT W/ TOMO W/ CAD
8 series · 9 of 24 positions shown · non-contrast
Comparison: Previous exam(s).

CLINICAL DATA: Screening.

EXAM:
DIGITAL SCREENING BILATERAL MAMMOGRAM WITH TOMO AND CAD

[L CC synth-2D]
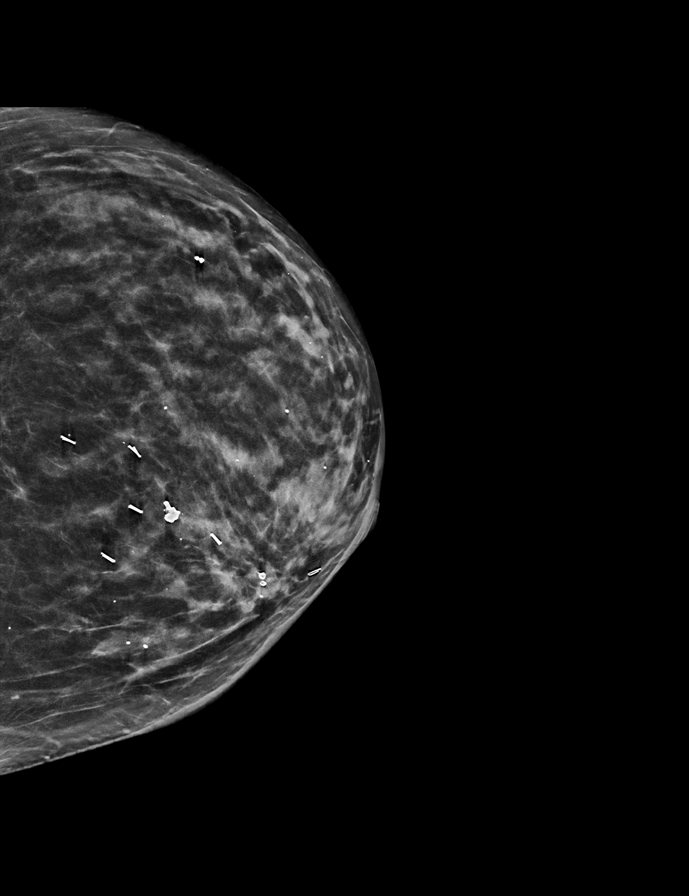

[R CC synth-2D]
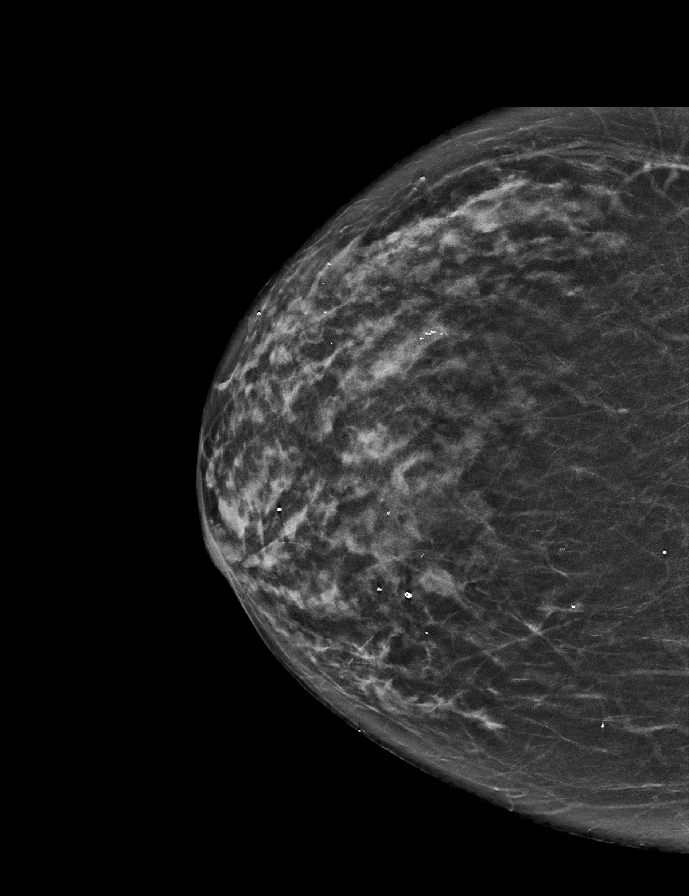

[L MLO synth-2D]
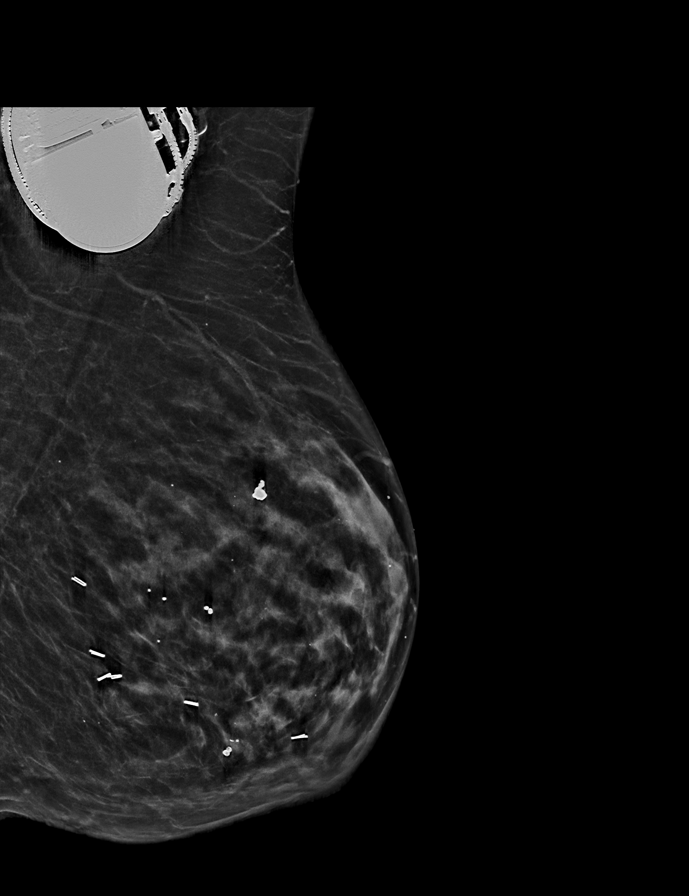

[R MLO synth-2D]
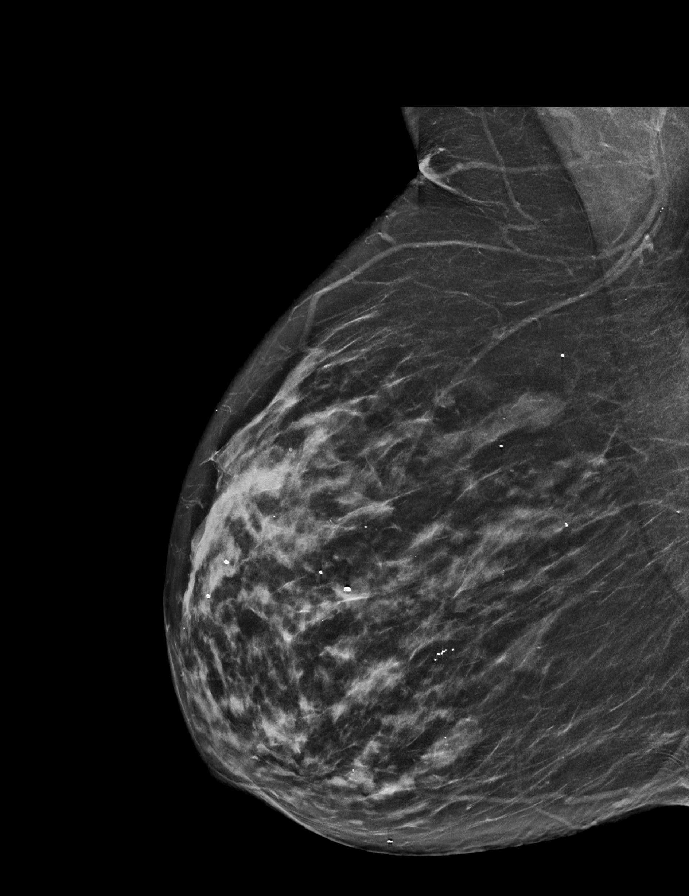

[L MLO tomo · 2 of 58 frames shown]
[frame 19/58]
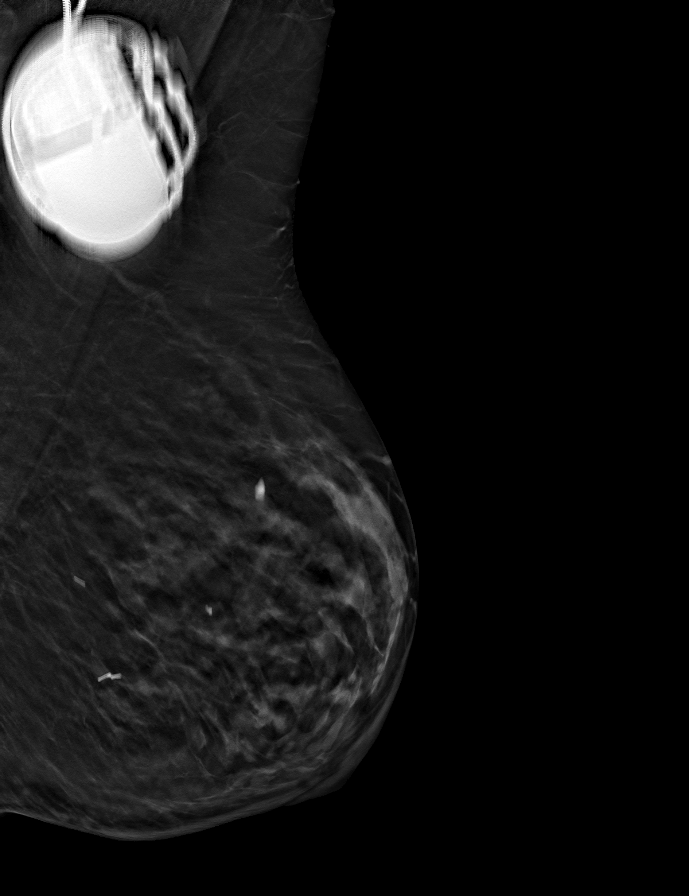
[frame 29/58]
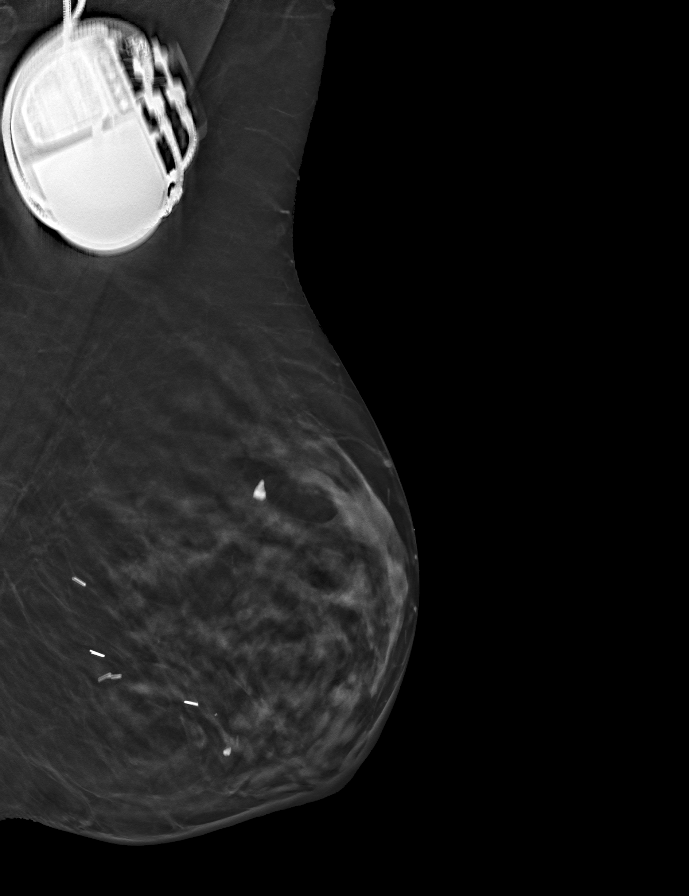

[R CC tomo · tomo slice 27/52.0]
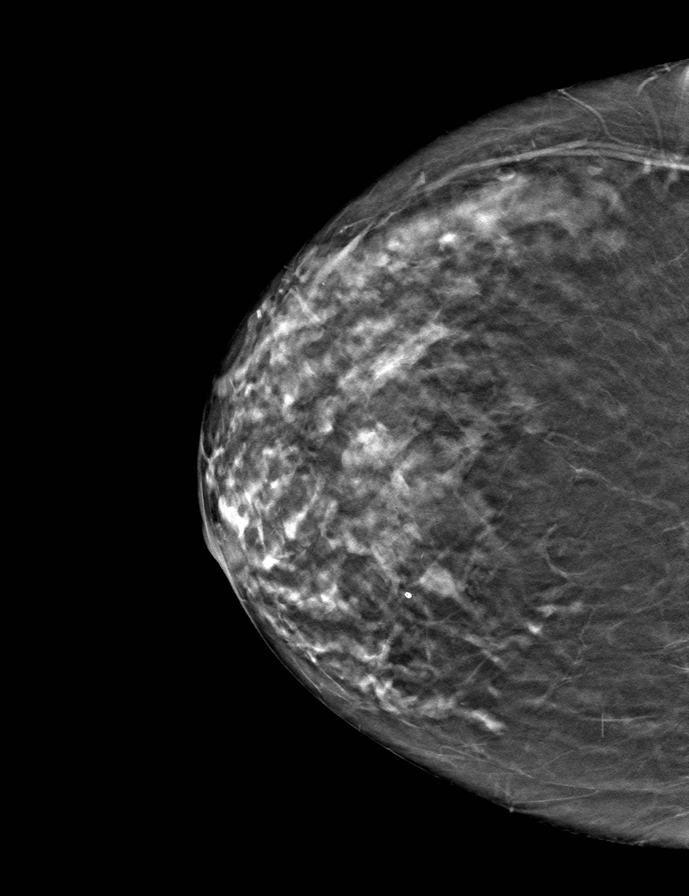

[R MLO tomo · tomo slice 30/59.0]
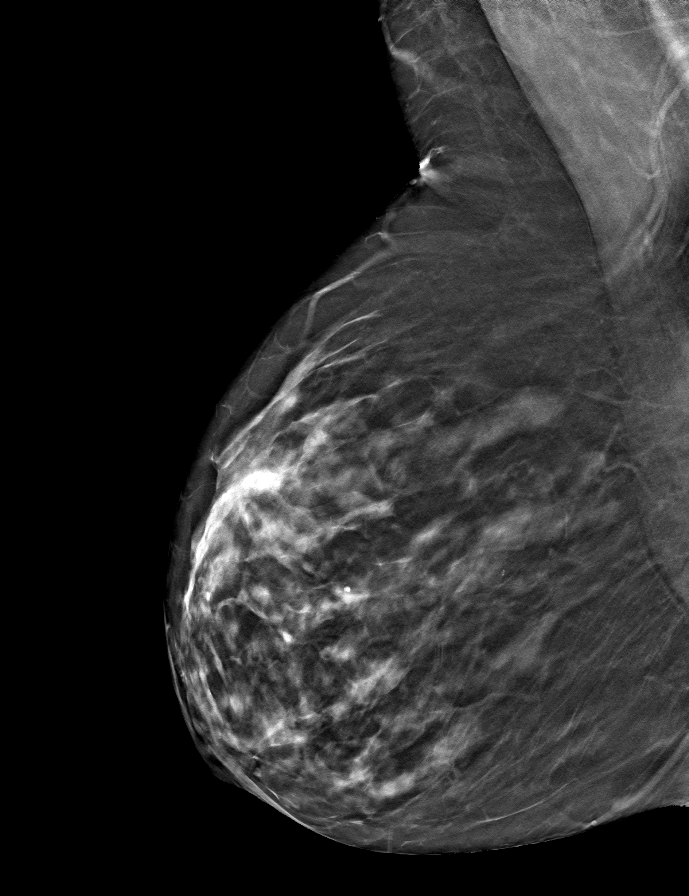

[L CC tomo · tomo slice 27/53.0]
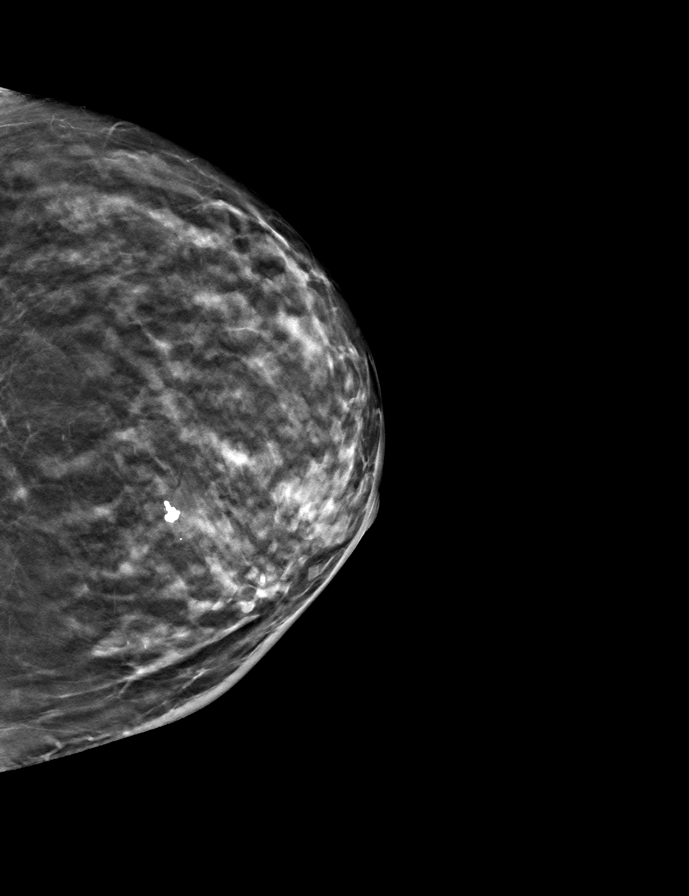

[9 of 24 positions shown; findings below may reference images not displayed]

ACR Breast Density Category c: The breast tissue is heterogeneously
dense, which may obscure small masses.
FINDINGS: In the right breast, calcifications warrant further evaluation with
magnified views. In the left breast, no findings suspicious for
malignancy. Images were processed with CAD.
IMPRESSION: Further evaluation is suggested for calcifications in the right
breast.

RECOMMENDATION:
Diagnostic mammogram of the right breast. (Code:[id number])

The patient will be contacted regarding the findings, and additional
imaging will be scheduled.

BI-RADS CATEGORY  0: Incomplete. Need additional imaging evaluation
and/or prior mammograms for comparison.

## 2021-08-08 ENCOUNTER — Encounter: Payer: Self-pay | Admitting: Physical Therapy

## 2021-08-08 ENCOUNTER — Other Ambulatory Visit: Payer: Self-pay

## 2021-08-08 ENCOUNTER — Ambulatory Visit: Payer: Medicare Other | Admitting: Occupational Therapy

## 2021-08-08 ENCOUNTER — Encounter: Payer: Self-pay | Admitting: Occupational Therapy

## 2021-08-08 ENCOUNTER — Ambulatory Visit: Payer: Medicare Other | Admitting: Physical Therapy

## 2021-08-08 VITALS — BP 117/88 | HR 109

## 2021-08-08 VITALS — BP 136/103 | HR 100

## 2021-08-08 DIAGNOSIS — R296 Repeated falls: Secondary | ICD-10-CM | POA: Diagnosis not present

## 2021-08-08 DIAGNOSIS — R2681 Unsteadiness on feet: Secondary | ICD-10-CM

## 2021-08-08 DIAGNOSIS — H5410 Blindness, one eye, low vision other eye, unspecified eyes: Secondary | ICD-10-CM | POA: Diagnosis not present

## 2021-08-08 DIAGNOSIS — R2689 Other abnormalities of gait and mobility: Secondary | ICD-10-CM | POA: Diagnosis not present

## 2021-08-08 DIAGNOSIS — M6281 Muscle weakness (generalized): Secondary | ICD-10-CM | POA: Diagnosis not present

## 2021-08-08 DIAGNOSIS — R41840 Attention and concentration deficit: Secondary | ICD-10-CM

## 2021-08-08 NOTE — Therapy (Signed)
[facility] [address], [place], [place], [address] Phone: [phone]   Fax:  [phone]  Physical Therapy Treatment  Patient Details  Name: Kayla Harrison MRN: [id number] Date of Birth: 07-02-1946 Referring Provider (PT): Garvin Fila, MD   Encounter Date: 08/08/2021   PT End of Session - 08/08/21 1246     Visit Number 6    Number of Visits 17    Date for PT Re-Evaluation 09/12/21    Authorization Type Medicare & MoO    PT Start Time 1017    PT Stop Time 1103    PT Time Calculation (min) 46 min    Activity Tolerance Patient tolerated treatment well;Other (comment)   limited by c/o lightheadedness   Behavior During Therapy WFL for tasks assessed/performed             Past Medical History:  Diagnosis Date   Anemia    Arthritis    osteoarthritis - knees and right shoulder   Blood transfusion without reported diagnosis    Breast cancer ([place])    Dr Margot Chimes, total thyroidectomy- 1999- for cancer   Brucellosis 1964   Chronic bilateral pleural effusions    Colon polyp    Dr Earlean Shawl   Complication of anesthesia    Ketamine produces LSD reaction, bright colored nightmarish experience    Dyslipidemia    Endometriosis    Fibroids    H/O pleural effusion    s/p thoracentesis w 3280ml withdrawn   Hepatitis    [place] ?hepatitis    History of dysphagia    due to radiation therapy   History of hiatal hernia    small noted on PET scan   Hypertension    Hypothyroidism    Lung cancer, lower lobe ([place]) 01/2017   radiation RX completed 03/04/17; will start chemo 6/27, pt unaware of lung cancer   Morbid obesity ([place])    Status post lap band surgery   Nephrolithiasis    Non Hodgkin's lymphoma ([place])    on chemotherapy   Persistent atrial fibrillation ([place])    a. s/p PVI 2008 b. s/p convergent ablation 5726 complicated by bradycardia requiring pacemaker implant   Personal history  of radiation therapy    Presence of permanent cardiac pacemaker    Rotator cuff tear    Right   Stroke ([place])    2003- [place] x2   SVC syndrome    with lung mass and non hodgkins lymphoma   Thyroid cancer ([place]) 2000    Past Surgical History:  Procedure Laterality Date   ABDOMINAL HYSTERECTOMY  1983   afib ablation     a. 2008 PVI b. 2014 convergent ablation   APPENDECTOMY     BONE MARROW BIOPSY  02/21/2017   BREAST LUMPECTOMY Left 2010   [place]     2015- negative   CARDIOVERSION  10/09/2012   Procedure: CARDIOVERSION;  Surgeon: Minus Breeding, MD;  Location: [place];  Service: Cardiovascular;  Laterality: N/A;   CARDIOVERSION  10/09/2012   Procedure: CARDIOVERSION;  Surgeon: Minus Breeding, MD;  Location: [facility] ENDOSCOPY;  Service: Cardiovascular;  Laterality: N/A;  [name] gave the ok to add pt to the add on , but we must check to find out if the can add pt on at [phone])   CARDIOVERSION N/A 11/20/2012   Procedure: CARDIOVERSION;  Surgeon: Fay Records, MD;  Location[facility]  ENDOSCOPY;  Service: Cardiovascular;  Laterality: N/A;   CARDIOVERSION N/A 07/18/2017   Procedure: CARDIOVERSION;  Surgeon: Acie Fredrickson Wonda Cheng, MD;  Location: [place];  Service: Cardiovascular;  Laterality: N/A;   CARDIOVERSION N/A 10/03/2017   Procedure: CARDIOVERSION;  Surgeon: Sanda Klein, MD;  Location: [facility] ENDOSCOPY;  Service: Cardiovascular;  Laterality: N/A;   CARDIOVERSION N/A 01/07/2018   Procedure: CARDIOVERSION;  Surgeon: Thayer Headings, MD;  Location: [facility] ENDOSCOPY;  Service: Cardiovascular;  Laterality: N/A;   CARDIOVERSION N/A 12/10/2019   Procedure: CARDIOVERSION;  Surgeon: Buford Dresser, MD;  Location: [facility] ENDOSCOPY;  Service: Cardiovascular;  Laterality: N/A;   CARDIOVERSION N/A 03/09/2021   Procedure: CARDIOVERSION;  Surgeon: Pixie Casino, MD;  Location: [place];  Service: Cardiovascular;  Laterality: N/A;   CHOLECYSTECTOMY     COLONOSCOPY W/  POLYPECTOMY     Dr Earlean Shawl   CYSTOSCOPY N/A 02/06/2015   Procedure: CYSTOSCOPY;  Surgeon: Kathie Rhodes, MD;  Location: [facility];  Service: Urology;  Laterality: N/A;   CYSTOSCOPY W/ RETROGRADES Left 11/17/2017   Procedure: CYSTOSCOPY WITH RETROGRADE /PYELOGRAM/;  Surgeon: Kathie Rhodes, MD;  Location: [facility];  Service: Urology;  Laterality: Left;   CYSTOSCOPY WITH RETROGRADE PYELOGRAM, URETEROSCOPY AND STENT PLACEMENT Right 02/06/2015   Procedure: RETROGRADE PYELOGRAM, RIGHT URETEROSCOPY STENT PLACEMENT;  Surgeon: Kathie Rhodes, MD;  Location: [facility];  Service: Urology;  Laterality: Right;   CYSTOSCOPY WITH RETROGRADE PYELOGRAM, URETEROSCOPY AND STENT PLACEMENT Right 03/07/2017   Procedure: CYSTOSCOPY WITH RIGHT RETROGRADE PYELOGRAM,RIGHT  URETEROSCOPYLASER LITHOTRIPSY  AND STENT PLACEMENT AND STONE BASKETRY;  Surgeon: Kathie Rhodes, MD;  Location: [place];  Service: Urology;  Laterality: Right;   EYE SURGERY     cataract surgery   fatty mass removal  1999   pubic area   HOLMIUM LASER APPLICATION N/A 8/67/5449   Procedure: HOLMIUM LASER APPLICATION;  Surgeon: Kathie Rhodes, MD;  Location: [facility];  Service: Urology;  Laterality: N/A;   HOLMIUM LASER APPLICATION Right 10/24/69   Procedure: HOLMIUM LASER APPLICATION;  Surgeon: Kathie Rhodes, MD;  Location: [facility];  Service: Urology;  Laterality: Right;   HOLMIUM LASER APPLICATION Left 11/11/7586   Procedure: HOLMIUM LASER APPLICATION;  Surgeon: Kathie Rhodes, MD;  Location: [facility];  Service: Urology;  Laterality: Left;   I & D EXTREMITY Left 12/19/2020   Procedure: IRRIGATION AND DEBRIDEMENT OF LEFT HIP HEMATOMA WITH APPLICATION OF WOUND VAC;  Surgeon: Erle Crocker, MD;  Location: [place];  Service: Orthopedics;  Laterality: Left;   I & D EXTREMITY Left 02/06/2021   Procedure: IRRIGATION AND DEBRIDEMENT DEEP ABCESS LEFT THIGH, SECONDARY CLOSURE OF WOUND DEHISCENCE;  Surgeon: Erle Crocker, MD;  Location:  [place];  Service: Orthopedics;  Laterality: Left;   IR FLUORO GUIDE PORT INSERTION RIGHT  02/24/2017   IR NEPHROSTOMY PLACEMENT RIGHT  11/17/2017   IR PATIENT EVAL TECH 0-60 MINS  03/11/2017   IR REMOVAL TUN ACCESS W/ PORT W/O FL MOD SED  04/20/2018   IR US GUIDE VASC ACCESS RIGHT  02/24/2017   KNEE ARTHROSCOPY     bilateral   LAPAROSCOPIC GASTRIC BANDING  07/10/2010   LAPAROSCOPIC GASTRIC BANDING     Laparoscopic adjustable banding APS System with posterior hiatal hernia, 2 suture.   LAPAROTOMY     for ruptured ovary and ovarian artery    NEPHROLITHOTOMY Right 11/17/2017   Procedure: NEPHROLITHOTOMY PERCUTANEOUS;  Surgeon: Kathie Rhodes, MD;  Location: [facility];  Service: Urology;  Laterality: Right;   PACEMAKER INSERTION  03/10/2013   MDT dual chamber PPM   POCKET REVISION N/A 12/08/2013   Procedure: POCKET REVISION;  Surgeon: Deboraha Sprang, MD;  Location: [facility] CATH LAB;  Service: Cardiovascular;  Laterality: N/A;   PORTA CATH INSERTION     REVERSE SHOULDER ARTHROPLASTY Right 05/14/2018   Procedure: RIGHT REVERSE SHOULDER ARTHROPLASTY;  Surgeon: Tania Ade, MD;  Location: [place];  Service: Orthopedics;  Laterality: Right;   REVERSE SHOULDER REPLACEMENT Right 05/14/2018   RIGHT HEART CATH N/A 07/21/2019   Procedure: RIGHT HEART CATH;  Surgeon: Larey Dresser, MD;  Location: [place] CV LAB;  Service: Cardiovascular;  Laterality: N/A;   TEE WITH CARDIOVERSION  09/22/2017   TEE WITHOUT CARDIOVERSION N/A 10/03/2017   Procedure: TRANSESOPHAGEAL ECHOCARDIOGRAM (TEE);  Surgeon: Sanda Klein, MD;  Location: [facility] ENDOSCOPY;  Service: Cardiovascular;  Laterality: N/A;   TEE WITHOUT CARDIOVERSION N/A 08/04/2019   Procedure: TRANSESOPHAGEAL ECHOCARDIOGRAM (TEE);  Surgeon: Larey Dresser, MD;  Location: [facility] ENDOSCOPY;  Service: Cardiovascular;  Laterality: N/A;   TEE WITHOUT CARDIOVERSION N/A 12/10/2019   Procedure: TRANSESOPHAGEAL ECHOCARDIOGRAM (TEE);  Surgeon: Buford Dresser, MD;   Location: [facility] ENDOSCOPY;  Service: Cardiovascular;  Laterality: N/A;   TEE WITHOUT CARDIOVERSION N/A 03/09/2021   Procedure: TRANSESOPHAGEAL ECHOCARDIOGRAM (TEE);  Surgeon: Pixie Casino, MD;  Location: [place];  Service: Cardiovascular;  Laterality: N/A;   THYROIDECTOMY  1998   Dr Margot Chimes   TONSILLECTOMY     TOTAL KNEE ARTHROPLASTY  04/13/2012   Procedure: TOTAL KNEE ARTHROPLASTY;  Surgeon: Rudean Haskell, MD;  Location: [place];  Service: Orthopedics;  Laterality: Right;   VIDEO BRONCHOSCOPY WITH ENDOBRONCHIAL ULTRASOUND N/A 02/07/2017   Procedure: VIDEO BRONCHOSCOPY WITH ENDOBRONCHIAL ULTRASOUND;  Surgeon: Marshell Garfinkel, MD;  Location: [place];  Service: Pulmonary;  Laterality: N/A;    Vitals:   08/08/21 1101  BP: (!) 136/103  Pulse: 100     Subjective Assessment - 08/08/21 1020     Subjective Seeing black dots kind of like floaters today; this has happened before. Notes some mild lightheadedness "but I wouldn't describe it as one of my main symptoms." Feeling tired but sleeping better. Denies red flag symptoms.    Pertinent History anemia, breast CA s/p surgery, lung CA s/p radiation and chemo, lymphoma on chemo, thyroid CA, HLD, hiatal hernia, HTN, a-fib, pacemaker, R reverse TSA 2019, stroke 2003, SVC syndrome, R TKA 2013    Diagnostic tests 03/15/21 head CT: Similar appearance of left caudothalamic hemorrhage with  intraventricular extension and mild adjacent edema. No new  hemorrhage. Unchanged mild trapping of the left lateral ventricle    Patient Stated Goals be able to get up off the floor    Currently in Pain? Yes    Pain Score 8     Pain Location Shoulder    Pain Orientation Right    Pain Descriptors / Indicators Sharp    Pain Type Chronic pain                               [facility] Adult PT Treatment/Exercise - 08/08/21 0001       Exercises   Exercises Shoulder      Knee/Hip Exercises: Aerobic   Nustep L4 x 6 min (UEs/LEs)      Knee/Hip  Exercises: Supine   Bridges Strengthening;Both;1 set;10 reps    Bridges Limitations good amplitude; hip instability    Bridges with Clamshell Strengthening;Both;1 set;10 reps   red TB  Other Supine Knee/Hip Exercises B isometric hip flexion with LEs on red pball 5x5"   cues for proper form   Other Supine Knee/Hip Exercises red TB clamshell 10x; alt bent knee fallout 10x   cues for decreased speed and increased control     Shoulder Exercises: Supine   Protraction Strengthening;Both;10 reps;Weights    Protraction Limitations 2x10 with red medbal   manual and verbal cues for form                    PT Education - 08/08/21 1245     Education Details edu on stroke symptoms including vision changes as possible sign of stroke; advised patient to call 911 if vision worsens or if notices new onset of stroke-like symptoms    Person(s) Educated Patient    Methods Explanation    Comprehension Verbalized understanding              PT Short Term Goals - 07/27/21 1011       PT SHORT TERM GOAL #1   Title Patient to be independent with initial HEP.    Time 3    Period Weeks    Status Achieved    Target Date 08/08/21               PT Long Term Goals - 07/25/21 1224       PT LONG TERM GOAL #1   Title Patient to be independent with advanced HEP.    Time 8    Period Weeks    Status On-going      PT LONG TERM GOAL #2   Title Patient to demonstrate B LE strength >/=4+/5.    Time 8    Period Weeks    Status On-going      PT LONG TERM GOAL #3   Title Patient to score at least 46/56 on [place] in order to decrease risk of falls.    Time 8    Period Weeks    Status On-going      PT LONG TERM GOAL #4   Title Patient to complete TUG in <14 sec with LRAD in order to decrease risk of falls.    Time 8    Period Weeks    Status On-going      PT LONG TERM GOAL #5   Title Patient to demonstrate 5xSTS test in <15 sec in order to decrease risk of falls.    Time 8     Period Weeks    Status On-going      PT LONG TERM GOAL #6   Title Patient to demonstrate safe floor transfer with mod I.    Time 8    Period Weeks    Status On-going                   Plan - 08/08/21 1247     Clinical Impression Statement Patient arrived to session with report of "seeing black dots kind of like floaters today; this has happened before." Notes some mild lightheadedness but denies red flag symptoms. Spoke with Dr. Leonie Man about patient's BP, however he reassured that patient's vitals have been [facility] in previous sessions despite diastolic BP being elevated above goal. Per MD's advice, instructed patient to keep a BP log and bring it to her upcoming MD appointments. Patient reported understanding. Worked on supine hip and core strengthening activities, which patient tolerated well. Some hip instability demonstrated. Cues for decreased speed and increased control required with activities.  Worked on scapular stability for improved posture as R scapular winging evident at rest and patient with c/o R scapular pain this AM. Reported lightheadedness upon sitting up from supine, which dissipated after a sitting rest break. BP at end of session appeared slightly elevated, thus allowed patient to sit and rest before leaving session. Educated patient on symptoms of stroke d/t c/o vision changes and to call 911 if these symptoms occur. Patient reported understanding and without complaints upon leaving.    Comorbidities anemia, breast CA s/p surgery, lung CA s/p radiation and chemo, lymphoma on chemo, thyroid CA, HLD, hiatal hernia, HTN, a-fib, pacemaker, R reverse TSA 2019, stroke 2003, SVC syndrome, R TKA 2013    PT Treatment/Interventions ADLs/Self Care Home Management;Cryotherapy;Electrical Stimulation;DME Instruction;Moist Heat;Gait training;Stair training;Functional mobility training;Therapeutic activities;Therapeutic exercise;Balance training;Neuromuscular re-education;Manual  techniques;Patient/family education;Passive range of motion;Dry needling;Energy conservation;Vestibular;Vasopneumatic Device;Taping    PT Next Visit Plan balance training with SLS activities; progress LE stretching and strengthening to improve posture and gait deviations; continue gait training with SPC    Consulted and Agree with Plan of Care Patient             Patient will benefit from skilled therapeutic intervention in order to improve the following deficits and impairments:  Abnormal gait, Decreased range of motion, Difficulty walking, Increased fascial restricitons, Increased muscle spasms, Decreased safety awareness, Decreased endurance, Cardiopulmonary status limiting activity, Decreased activity tolerance, Decreased balance, Impaired flexibility, Improper body mechanics, Postural dysfunction, Decreased strength  Visit Diagnosis: Unsteadiness on feet  Muscle weakness (generalized)  Other abnormalities of gait and mobility     Problem List Patient Active Problem List   Diagnosis Date Noted   Hypertension    Pressure injury of skin 03/15/2021   Intracranial hemorrhage (HCC) 03/14/2021   Lower extremity edema    Polymicrobial bacterial infection    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    Enterococcus faecalis infection    Sepsis due to Escherichia coli (HCC)    Debility 02/13/2021   Infection of wound hematoma 02/05/2021   Physical debility 12/22/2020   Tear of left hamstring 12/22/2020   Labral tear of left hip joint 12/22/2020   Acute blood loss anemia    Orthostatic hypotension 12/17/2020   History of CVA (cerebrovascular accident) 12/17/2020   Fall 12/15/2020   Hip hematoma, left, initial encounter 12/15/2020   Nondisplaced fracture of coronoid process of left ulna, initial encounter for closed fracture 12/15/2020   Multiple rib fractures 12/15/2020   COVID-19 virus infection 10/12/2020   Constipation 06/22/2020   Change in bowel habits 06/22/2020    Diverticulosis 06/22/2020   Dark stools 06/22/2020   Hemorrhoids 06/22/2020   Pacemaker 01/23/2020   Junctional rhythm 01/23/2020   Palpitations 01/23/2020   ICH (intracerebral hemorrhage) (HCC) 12/02/2019   A-fib (HCC) 12/02/2019   Anemia 12/02/2019   QT prolongation 12/02/2019   Hypothyroidism 12/02/2019   Gait abnormality 07/10/2018   S/P reverse total shoulder arthroplasty, 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   Persistent atrial fibrillation (HCC)    Bilateral ureteral calculi 11/17/2017   Atrial fibrillation with RVR (HCC) 09/15/2017   Atrial flutter (HCC) 07/17/2017   Port catheter in place 04/02/2017   Non-Hodgkin lymphoma, unspecified, intrathoracic lymph nodes (HCC) 02/13/2017   Mediastinal mass 02/06/2017   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   Abnormal chest x-ray    Lung mass 02/03/2017   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   OSA (obstructive sleep apnea) 02/03/2015   History of renal calculi 11/16/2014   Renal calculi 11/16/2014  Family history of colon cancer 10/26/2014   Mechanical complication due to cardiac pacemaker pulse generator 11/06/2013   Dyspnea 05/12/2013   Hypothyroidism 04/21/2013   Pleural effusion 04/19/2013   Long term (current) use of anticoagulants 12/20/2010   EPIDERMOID CYST 08/22/2010   Chronic diastolic heart failure ([place]) 08/06/2010   SYNCOPE AND COLLAPSE 07/27/2010   OSTEOARTHRITIS, KNEE, RIGHT 03/15/2010   Class 1 obesity due to excess calories with body mass index (BMI) of 33.0 to 33.9 in adult 12/07/2009   NONSPEC ELEVATION OF LEVELS OF TRANSAMINASE/LDH 09/08/2009   BREAST CANCER, HX OF 07/25/2009   COLONIC POLYPS, HX OF 07/25/2009   TUBULOVILLOUS ADENOMA, 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 [place], [place],  [address] Phone: [phone]   Fax:  [phone]  Name: Kayla Harrison MRN: [id number] Date of Birth: 1945-12-03

## 2021-08-08 NOTE — Therapy (Signed)
[facility] [address], [place], [place], [address] Phone: [phone]   Fax:  [phone]  Occupational Therapy Treatment  Patient Details  Name: Kayla Harrison MRN: [id number] Date of Birth: Jan 18, 1946 Referring Provider (OT): Leonie Man   Encounter Date: 08/08/2021   OT End of Session - 08/08/21 0943     Visit Number 6    Number of Visits 17    Date for OT Re-Evaluation 09/12/21    Authorization Type Medicare A and B    OT Start Time 0934    OT Stop Time 1015    OT Time Calculation (min) 41 min    Activity Tolerance Patient tolerated treatment well    Behavior During Therapy [facility] for tasks assessed/performed             Past Medical History:  Diagnosis Date   Anemia    Arthritis    osteoarthritis - knees and right shoulder   Blood transfusion without reported diagnosis    Breast cancer ([place])    Dr Margot Chimes, total thyroidectomy- 1999- for cancer   Brucellosis 1964   Chronic bilateral pleural effusions    Colon polyp    Dr Earlean Shawl   Complication of anesthesia    Ketamine produces LSD reaction, bright colored nightmarish experience    Dyslipidemia    Endometriosis    Fibroids    H/O pleural effusion    s/p thoracentesis w 3221m withdrawn   Hepatitis    [place] ?hepatitis    History of dysphagia    due to radiation therapy   History of hiatal hernia    small noted on PET scan   Hypertension    Hypothyroidism    Lung cancer, lower lobe ([place] 01/[place]   radiation RX completed 03/04/17; will start chemo 6/27, pt unaware of lung cancer   Morbid obesity ([place]    Status post lap band surgery   Nephrolithiasis    Non Hodgkin's lymphoma ([place]    on chemotherapy   Persistent atrial fibrillation ([place]    a. s/p PVI 2008 b. s/p convergent ablation 23295complicated by bradycardia requiring pacemaker implant   Personal history of radiation therapy    Presence of permanent cardiac  pacemaker    Rotator cuff tear    Right   [place]   SVC syndrome    with lung mass and non hodgkins lymphoma   Thyroid cancer ([place] 2000    Past Surgical History:  Procedure Laterality Date   ABDOMINAL HYSTERECTOMY  1983   afib ablation     a. 2008 PVI b. 2014 convergent ablation   APPENDECTOMY     BONE MARROW BIOPSY  02/21/2017   BREAST LUMPECTOMY Left 2010   [place]    2015- negative   CARDIOVERSION  10/09/2012   Procedure: CARDIOVERSION;  Surgeon: JMinus Breeding MD;  [place]  Service: Cardiovascular;  Laterality: N/A;   CARDIOVERSION  10/09/2012   Procedure: CARDIOVERSION;  Surgeon: JMinus Breeding MD;  [facility]  Service: Cardiovascular;  Laterality: N/A;  [name] the ok to add pt to the add on , but we must check to find out if the can add pt on at 1400 ( [phone])   CARDIOVERSION N/A 11/20/2012   Procedure: CARDIOVERSION;  Surgeon: PFay Records MD;  [facility]  Service: Cardiovascular;  Laterality: N/A;  CARDIOVERSION N/A 07/18/2017   Procedure: CARDIOVERSION;  Surgeon: Acie Fredrickson, Wonda Cheng, MD;  Location: [place];  Service: Cardiovascular;  Laterality: N/A;   CARDIOVERSION N/A 10/03/2017   Procedure: CARDIOVERSION;  Surgeon: Sanda Klein, MD;  [facility];  Service: Cardiovascular;  Laterality: N/A;   CARDIOVERSION N/A 01/07/2018   Procedure: CARDIOVERSION;  Surgeon: Acie Fredrickson Wonda Cheng, MD;  [facility];  Service: Cardiovascular;  Laterality: N/A;   CARDIOVERSION N/A 12/10/2019   Procedure: CARDIOVERSION;  Surgeon: Buford Dresser, MD;  Location: [facility] ENDOSCOPY;  Service: Cardiovascular;  Laterality: N/A;   CARDIOVERSION N/A 03/09/2021   Procedure: CARDIOVERSION;  Surgeon: Pixie Casino, MD;  Location: [place];  Service: Cardiovascular;  Laterality: N/A;   CHOLECYSTECTOMY     COLONOSCOPY W/ POLYPECTOMY     Dr Earlean Shawl   CYSTOSCOPY N/A 02/06/2015    Procedure: CYSTOSCOPY;  Surgeon: Kathie Rhodes, MD;  [facility];  Service: Urology;  Laterality: N/A;   CYSTOSCOPY W/ RETROGRADES Left 11/17/2017   Procedure: CYSTOSCOPY WITH RETROGRADE /PYELOGRAM/;  Surgeon: Kathie Rhodes, MD;  [facility];  Service: Urology;  Laterality: Left;   CYSTOSCOPY WITH RETROGRADE PYELOGRAM, URETEROSCOPY AND STENT PLACEMENT Right 02/06/2015   Procedure: RETROGRADE PYELOGRAM, RIGHT URETEROSCOPY STENT PLACEMENT;  Surgeon: Kathie Rhodes, MD;  [facility];  Service: Urology;  Laterality: Right;   CYSTOSCOPY WITH RETROGRADE PYELOGRAM, URETEROSCOPY AND STENT PLACEMENT Right 03/07/2017   Procedure: CYSTOSCOPY WITH RIGHT RETROGRADE PYELOGRAM,RIGHT  URETEROSCOPYLASER LITHOTRIPSY  AND STENT PLACEMENT AND STONE BASKETRY;  Surgeon: Kathie Rhodes, MD;  Location: [place];  Service: Urology;  Laterality: Right;   EYE SURGERY     cataract surgery   fatty mass removal  1999   pubic area   HOLMIUM LASER APPLICATION N/A 7/89/3810   Procedure: HOLMIUM LASER APPLICATION;  Surgeon: Kathie Rhodes, MD;  [facility];  Service: Urology;  Laterality: N/A;   HOLMIUM LASER APPLICATION Right 1/75/1025   Procedure: HOLMIUM LASER APPLICATION;  Surgeon: Kathie Rhodes, MD;  Location: [facility];  Service: Urology;  Laterality: Right;   HOLMIUM LASER APPLICATION Left 8/52/7782   Procedure: HOLMIUM LASER APPLICATION;  Surgeon: Kathie Rhodes, MD;  [facility];  Service: Urology;  Laterality: Left;   I & D EXTREMITY Left 12/19/2020   Procedure: IRRIGATION AND DEBRIDEMENT OF LEFT HIP HEMATOMA WITH APPLICATION OF WOUND VAC;  Surgeon: Erle Crocker, MD;  Location: [place];  Service: Orthopedics;  Laterality: Left;   I & D EXTREMITY Left 02/06/2021   Procedure: IRRIGATION AND DEBRIDEMENT DEEP ABCESS LEFT THIGH, SECONDARY CLOSURE OF WOUND DEHISCENCE;  Surgeon: Erle Crocker, MD;  Location: [place];  Service: Orthopedics;  Laterality: Left;   IR  FLUORO GUIDE PORT INSERTION RIGHT  02/24/2017   IR NEPHROSTOMY PLACEMENT RIGHT  11/17/2017   IR PATIENT EVAL TECH 0-60 MINS  03/11/2017   IR REMOVAL TUN ACCESS W/ PORT W/O FL MOD SED  04/20/2018   IR US GUIDE VASC ACCESS RIGHT  02/24/2017   KNEE ARTHROSCOPY     bilateral   LAPAROSCOPIC GASTRIC BANDING  07/10/2010   LAPAROSCOPIC GASTRIC BANDING     Laparoscopic adjustable banding APS System with posterior hiatal hernia, 2 suture.   LAPAROTOMY     for ruptured ovary and ovarian artery    NEPHROLITHOTOMY Right 11/17/2017   Procedure: NEPHROLITHOTOMY PERCUTANEOUS;  Surgeon: Kathie Rhodes, MD;  [facility];  Service: Urology;  Laterality: Right;   PACEMAKER INSERTION  03/10/2013   MDT dual chamber PPM  POCKET REVISION N/A 12/08/2013   Procedure: POCKET REVISION;  Surgeon: Deboraha Sprang, MD;  [facility];  Service: Cardiovascular;  Laterality: N/A;   PORTA CATH INSERTION     REVERSE SHOULDER ARTHROPLASTY Right 05/14/2018   Procedure: RIGHT REVERSE SHOULDER ARTHROPLASTY;  Surgeon: Tania Ade, MD;  Location: [place];  Service: Orthopedics;  Laterality: Right;   REVERSE SHOULDER REPLACEMENT Right 05/14/2018   RIGHT HEART CATH N/A 07/21/2019   Procedure: RIGHT HEART CATH;  Surgeon: Larey Dresser, MD;  Location: [place] CV LAB;  Service: Cardiovascular;  Laterality: N/A;   TEE WITH CARDIOVERSION  09/22/2017   TEE WITHOUT CARDIOVERSION N/A 10/03/2017   Procedure: TRANSESOPHAGEAL ECHOCARDIOGRAM (TEE);  Surgeon: Sanda Klein, MD;  Location: [facility] ENDOSCOPY;  Service: Cardiovascular;  Laterality: N/A;   TEE WITHOUT CARDIOVERSION N/A 08/04/2019   Procedure: TRANSESOPHAGEAL ECHOCARDIOGRAM (TEE);  Surgeon: Larey Dresser, MD;  Location: [facility] ENDOSCOPY;  Service: Cardiovascular;  Laterality: N/A;   TEE WITHOUT CARDIOVERSION N/A 12/10/2019   Procedure: TRANSESOPHAGEAL ECHOCARDIOGRAM (TEE);  Surgeon: Buford Dresser, MD;  Location: [facility] ENDOSCOPY;  Service: Cardiovascular;  Laterality:  N/A;   TEE WITHOUT CARDIOVERSION N/A 03/09/2021   Procedure: TRANSESOPHAGEAL ECHOCARDIOGRAM (TEE);  Surgeon: Pixie Casino, MD;  Location: [place];  Service: Cardiovascular;  Laterality: N/A;   THYROIDECTOMY  1998   Dr Margot Chimes   TONSILLECTOMY     TOTAL KNEE ARTHROPLASTY  04/13/2012   Procedure: TOTAL KNEE ARTHROPLASTY;  Surgeon: Rudean Haskell, MD;  Location: [place];  Service: Orthopedics;  Laterality: Right;   VIDEO BRONCHOSCOPY WITH ENDOBRONCHIAL ULTRASOUND N/A 02/07/2017   Procedure: VIDEO BRONCHOSCOPY WITH ENDOBRONCHIAL ULTRASOUND;  Surgeon: Marshell Garfinkel, MD;  Location: [place];  Service: Pulmonary;  Laterality: N/A;    Vitals:   08/08/21 0939  BP: 117/88  Pulse: (!) 109     Subjective Assessment - 08/08/21 0939     Subjective  Pt reports not feeling well last few days, due to not sleeping and intermittent coughing. Saw the Dr yesterday and reports some "lung" infection but now on antibiotic and slept better last 2 nights.    Pertinent History h/o hyperlipidemia, atrial fibrillation with pacemaker on Eliquis, lymphoma, confusion, subcortical hemorrhage on the left with intraventricular extension    Currently in Pain? Yes    Pain Score 8     Pain Location Shoulder    Pain Orientation Right    Pain Descriptors / Indicators Sharp    Pain Onset In the past 7 days    Pain Frequency Occasional                 Engaged in dynamic standing activities with focus on activity tolerance and dynamic balance.  Engaged in sit > stand 2 x10 with use of 1kg medicine ball progressing to 1.5kg ball while reaching overhead to simulate homemaking tasks (ie laundry).  Pt with intermittent posterior LOB but able to correct.  During 2nd set pt with difficulty with sit > stand, requiring cue to "reset" before returning to task.  Pt with improved sit > stand and trunk control after brief rest.    Trunk rotation in standing with focus on weight shifting and reaching outside BOS and up to mid  range to simulate homemaking tasks.  Therapist providing CGA for trunk control when rotating to obtain items from mat behind pt.  Pt reports mild dizziness, however passed with short rest break.  Able to complete 2nd set after rest break and no reports of dizziness.  Therapist educated on energy conservation strategies to continue to implement during functional tasks and handwriting.  Pt reports improvements noted in handwriting when she slows down and takes rest breaks.  Pt reports continuing to complete putty and FMC exercises.                   OT Short Term Goals - 07/25/21 1022       OT SHORT TERM GOAL #1   Title Pt will verbalize understanding of 3 compensatory strategies for low vision.    Time 4    Period Weeks    Status On-going    Target Date 08/15/21      OT SHORT TERM GOAL #2   Title Pt will improve hand strength and endurance to improve handwriting legiblity to 90%    Time 4    Period Weeks    Status On-going      OT SHORT TERM GOAL #3   Title Pt will demonstrate improved activity tolerance to complete standing acitivity for 15 mins without seated rest break.    Time 4    Period Weeks    Status On-going      OT SHORT TERM GOAL #4   Title Pt will improve shoulder ROM to 140* in RUE to increase functional reach in to cabinets    Time 4    Period Weeks    Status On-going               OT Long Term Goals - 07/25/21 1022       OT LONG TERM GOAL #1   Title Pt will be independent in energy conservation strategies.    Time 8    Period Weeks    Status On-going      OT LONG TERM GOAL #2   Title Pt will be independent in modifications for low vision.    Time 8    Period Weeks    Status On-going      OT LONG TERM GOAL #3   Title Pt wil be able to complete housekeeping task incorporating reaching outside BOS without reports of dizziness.    Time 8    Period Weeks    Status On-going      OT LONG TERM GOAL #4   Title Pt will be able to  complete dual task activity without cues for sequencing and with improved safety awareness.    Time 8    Period Weeks    Status On-going                   Plan - 08/08/21 1019     Clinical Impression Statement Pt continues to be limited by fatigue and decreased endurance, benefiting from cues for activity tolerance/endurance and importance of energy conservation and recommendations for rest breaks to decrease fall risk. Pt also with cues for rest breaks during handwriting to increase legibility and endurance with "work book" activities, pt reports noting improvements when implementing rest breaks. Pt will continue to benefit from skilled occupational therapy services to address strength and endurance, FMC/GMC, safety awareness, energy conservation, and safety with ADLs and IADLs.    OT Occupational Profile and History Detailed Assessment- Review of Records and additional review of physical, cognitive, psychosocial history related to current functional performance    Occupational performance deficits (Please refer to evaluation for details): ADL's;IADL's;Leisure;Social Participation    Body Structure / Function / Physical Skills ADL;Balance;Body mechanics;Cardiopulmonary status limiting activity;Coordination;Endurance;FMC;GMC;IADL;Mobility;Pain;ROM;Proprioception;Sensation;Strength;UE functional use;Vision    Psychosocial Skills  Environmental  Adaptations;Routines and Behaviors    Rehab Potential Good    Clinical Decision Making Limited treatment options, no task modification necessary    Comorbidities Affecting Occupational Performance: May have comorbidities impacting occupational performance    Modification or Assistance to Complete Evaluation  No modification of tasks or assist necessary to complete eval    OT Frequency 2x / week    OT Duration 8 weeks    OT Treatment/Interventions Self-care/ADL training;Cryotherapy;Electrical Stimulation;Ultrasound;Moist Heat;Fluidtherapy;Therapeutic  exercise;Neuromuscular education;Energy conservation;Manual Therapy;Functional Mobility Training;DME and/or AE instruction;Passive range of motion;Patient/family education;Visual/perceptual remediation/compensation;Cognitive remediation/compensation;Therapeutic activities;Balance training;Psychosocial skills training;Coping strategies training    Plan handwriting to include passage or grocery list.  Continue gross and fine motor strengtening for handwriting.  Dynamic balance/mobility while addressing visual fields, as pt wants to return to driving.    Consulted and Agree with Plan of Care Patient             Patient will benefit from skilled therapeutic intervention in order to improve the following deficits and impairments:   Body Structure / Function / Physical Skills: ADL, Balance, Body mechanics, Cardiopulmonary status limiting activity, Coordination, Endurance, FMC, GMC, IADL, Mobility, Pain, ROM, Proprioception, Sensation, Strength, UE functional use, Vision   Psychosocial Skills: Environmental  Adaptations, Routines and Behaviors   Visit Diagnosis: Unsteadiness on feet  Muscle weakness (generalized)  Other abnormalities of gait and mobility  Attention and concentration deficit  Blindness, one eye, low vision other eye, unspecified eyes    Problem List Patient Active Problem List   Diagnosis Date Noted   Hypertension    Pressure injury of skin 03/15/2021   Intracranial hemorrhage ([place]) 03/14/2021   Lower extremity edema    Polymicrobial bacterial infection    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    Enterococcus faecalis infection    Sepsis due to Escherichia coli ([place])    Debility 02/13/2021   Infection of wound hematoma 02/05/2021   Physical debility 12/22/2020   Tear of left hamstring 12/22/2020   Labral tear of left hip joint 12/22/2020   Acute blood loss anemia    Orthostatic hypotension 12/17/2020   History of CVA (cerebrovascular accident) 12/17/2020    Fall 12/15/2020   Hip hematoma, left, initial encounter 12/15/2020   Nondisplaced fracture of coronoid process of left ulna, initial encounter for closed fracture 12/15/2020   Multiple rib fractures 12/15/2020   COVID-19 virus infection 10/12/2020   Constipation 06/22/2020   Change in bowel habits 06/22/2020   Diverticulosis 06/22/2020   Dark stools 06/22/2020   Hemorrhoids 06/22/2020   Pacemaker 01/23/2020   Junctional rhythm 01/23/2020   Palpitations 01/23/2020   ICH (intracerebral hemorrhage) ([place]) 12/02/2019   A-fib ([place]) 12/02/2019   Anemia 12/02/2019   QT prolongation 12/02/2019   Hypothyroidism 12/02/2019   Gait abnormality 07/10/2018   S/P reverse total shoulder arthroplasty, 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   Persistent atrial fibrillation ([place])    Bilateral ureteral calculi 11/17/2017   Atrial fibrillation with RVR ([place]) 09/15/2017   Atrial flutter ([place]) 07/17/2017   Port catheter in place 04/02/2017   Non-Hodgkin lymphoma, unspecified, intrathoracic lymph nodes ([place]) 02/13/2017   Mediastinal mass 02/06/2017   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[place]) 02/06/2017   Abnormal chest x-ray    Lung mass 02/03/2017   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   OSA (obstructive sleep apnea) 02/03/2015   History of renal calculi 11/16/2014   Renal calculi 11/16/2014   Family history of colon cancer 10/26/2014   Mechanical complication due to cardiac pacemaker pulse generator 11/06/2013  Dyspnea 05/12/2013   Hypothyroidism 04/21/2013   Pleural effusion 04/19/2013   Long term (current) use of anticoagulants 12/20/2010   EPIDERMOID CYST 08/22/2010   Chronic diastolic heart failure ([place]) 08/06/2010   SYNCOPE AND COLLAPSE 07/27/2010   OSTEOARTHRITIS, KNEE, RIGHT 03/15/2010   Class 1 obesity due to excess calories with body mass index (BMI) of 33.0 to 33.9 in adult 12/07/2009   NONSPEC ELEVATION OF LEVELS OF TRANSAMINASE/LDH 09/08/2009   BREAST CANCER, HX OF 07/25/2009   COLONIC POLYPS, HX OF  07/25/2009   TUBULOVILLOUS ADENOMA, COLON 04/29/2008   HYPERGLYCEMIA, FASTING 04/29/2008   HYPERLIPIDEMIA 06/22/2007   CARCINOMA, THYROID GLAND, HX OF 06/22/2007    Simonne Come, OT/L 08/08/2021, 11:18 AM  [facility] [address] [place], [place], [address] Phone: [phone]   Fax:  [phone]  Name: BURNIE HANK MRN: [id number] Date of Birth: Jun 13, 1946

## 2021-08-13 ENCOUNTER — Encounter: Payer: Self-pay | Admitting: Occupational Therapy

## 2021-08-13 ENCOUNTER — Ambulatory Visit: Payer: Medicare Other | Admitting: Occupational Therapy

## 2021-08-13 ENCOUNTER — Encounter: Payer: Self-pay | Admitting: Physical Therapy

## 2021-08-13 ENCOUNTER — Other Ambulatory Visit: Payer: Self-pay

## 2021-08-13 ENCOUNTER — Ambulatory Visit: Payer: Medicare Other | Admitting: Physical Therapy

## 2021-08-13 VITALS — BP 133/101 | HR 103

## 2021-08-13 DIAGNOSIS — H5410 Blindness, one eye, low vision other eye, unspecified eyes: Secondary | ICD-10-CM

## 2021-08-13 DIAGNOSIS — R2689 Other abnormalities of gait and mobility: Secondary | ICD-10-CM

## 2021-08-13 DIAGNOSIS — M6281 Muscle weakness (generalized): Secondary | ICD-10-CM

## 2021-08-13 DIAGNOSIS — R296 Repeated falls: Secondary | ICD-10-CM | POA: Diagnosis not present

## 2021-08-13 DIAGNOSIS — Z20822 Contact with and (suspected) exposure to covid-19: Secondary | ICD-10-CM | POA: Diagnosis not present

## 2021-08-13 DIAGNOSIS — R41840 Attention and concentration deficit: Secondary | ICD-10-CM

## 2021-08-13 DIAGNOSIS — R2681 Unsteadiness on feet: Secondary | ICD-10-CM

## 2021-08-13 NOTE — Therapy (Signed)
+[facility] [address], [place], [place], [address] Phone: [phone]   Fax:  [phone]  Occupational Therapy Treatment  Patient Details  Name: CIARRAH RAE MRN: [id number] Date of Birth: 1946-04-24 Referring Provider (OT): Leonie Man   Encounter Date: 08/13/2021   OT End of Session - 08/13/21 1101     Visit Number 7    Number of Visits 17    Date for OT Re-Evaluation 09/12/21    Authorization Type Medicare A and B    OT Start Time 1021    OT Stop Time 1101    OT Time Calculation (min) 40 min    Activity Tolerance Patient tolerated treatment well;Other (comment)   reports occasional lightheadedness and "dots"in vision   Behavior During Therapy WFL for tasks assessed/performed             Past Medical History:  Diagnosis Date   Anemia    Arthritis    osteoarthritis - knees and right shoulder   Blood transfusion without reported diagnosis    Breast cancer ([place])    Dr Margot Chimes, total thyroidectomy- 1999- for cancer   Brucellosis 1964   Chronic bilateral pleural effusions    Colon polyp    Dr Earlean Shawl   Complication of anesthesia    Ketamine produces LSD reaction, bright colored nightmarish experience    Dyslipidemia    Endometriosis    Fibroids    H/O pleural effusion    s/p thoracentesis w 3213ml withdrawn   Hepatitis    [place] ?hepatitis    History of dysphagia    due to radiation therapy   History of hiatal hernia    small noted on PET scan   Hypertension    Hypothyroidism    Lung cancer, lower lobe ([place]) 01/2017   radiation RX completed 03/04/17; will start chemo 6/27, pt unaware of lung cancer   Morbid obesity ([place])    Status post lap band surgery   Nephrolithiasis    Non Hodgkin's lymphoma ([place])    on chemotherapy   Persistent atrial fibrillation ([place])    a. s/p PVI 2008 b. s/p convergent ablation 8546 complicated by bradycardia requiring pacemaker implant    Personal history of radiation therapy    Presence of permanent cardiac pacemaker    Rotator cuff tear    Right   Stroke ([place])    2003- [place] x2   SVC syndrome    with lung mass and non hodgkins lymphoma   Thyroid cancer ([place]) 2000    Past Surgical History:  Procedure Laterality Date   ABDOMINAL HYSTERECTOMY  1983   afib ablation     a. 2008 PVI b. 2014 convergent ablation   APPENDECTOMY     BONE MARROW BIOPSY  02/21/2017   BREAST LUMPECTOMY Left 2010   [place] CATHETERIZATION     2015- negative   CARDIOVERSION  10/09/2012   Procedure: CARDIOVERSION;  Surgeon: Minus Breeding, MD;  Location: [place];  Service: Cardiovascular;  Laterality: N/A;   CARDIOVERSION  10/09/2012   Procedure: CARDIOVERSION;  Surgeon: Minus Breeding, MD;  Location: [facility] ENDOSCOPY;  Service: Cardiovascular;  Laterality: N/A;  [name] gave the ok to add pt to the add on , but we must check to find out if the can add pt on at [phone])   CARDIOVERSION N/A 11/20/2012   Procedure: CARDIOVERSION;  Surgeon: Fay Records, MD;  Location: [facility]  ENDOSCOPY;  Service: Cardiovascular;  Laterality: N/A;   CARDIOVERSION N/A 07/18/2017   Procedure: CARDIOVERSION;  Surgeon: Acie Fredrickson Wonda Cheng, MD;  Location: [place];  Service: Cardiovascular;  Laterality: N/A;   CARDIOVERSION N/A 10/03/2017   Procedure: CARDIOVERSION;  Surgeon: Sanda Klein, MD;  Location: [facility] ENDOSCOPY;  Service: Cardiovascular;  Laterality: N/A;   CARDIOVERSION N/A 01/07/2018   Procedure: CARDIOVERSION;  Surgeon: Thayer Headings, MD;  Location: [facility] ENDOSCOPY;  Service: Cardiovascular;  Laterality: N/A;   CARDIOVERSION N/A 12/10/2019   Procedure: CARDIOVERSION;  Surgeon: Buford Dresser, MD;  Location: [facility] ENDOSCOPY;  Service: Cardiovascular;  Laterality: N/A;   CARDIOVERSION N/A 03/09/2021   Procedure: CARDIOVERSION;  Surgeon: Pixie Casino, MD;  Location: [place];  Service: Cardiovascular;  Laterality: N/A;   CHOLECYSTECTOMY      COLONOSCOPY W/ POLYPECTOMY     Dr Earlean Shawl   CYSTOSCOPY N/A 02/06/2015   Procedure: CYSTOSCOPY;  Surgeon: Kathie Rhodes, MD;  Location: [facility] ORS;  Service: Urology;  Laterality: N/A;   CYSTOSCOPY W/ RETROGRADES Left 11/17/2017   Procedure: CYSTOSCOPY WITH RETROGRADE /PYELOGRAM/;  Surgeon: Kathie Rhodes, MD;  Location: [facility];  Service: Urology;  Laterality: Left;   CYSTOSCOPY WITH RETROGRADE PYELOGRAM, URETEROSCOPY AND STENT PLACEMENT Right 02/06/2015   Procedure: RETROGRADE PYELOGRAM, RIGHT URETEROSCOPY STENT PLACEMENT;  Surgeon: Kathie Rhodes, MD;  Location: [facility];  Service: Urology;  Laterality: Right;   CYSTOSCOPY WITH RETROGRADE PYELOGRAM, URETEROSCOPY AND STENT PLACEMENT Right 03/07/2017   Procedure: CYSTOSCOPY WITH RIGHT RETROGRADE PYELOGRAM,RIGHT  URETEROSCOPYLASER LITHOTRIPSY  AND STENT PLACEMENT AND STONE BASKETRY;  Surgeon: Kathie Rhodes, MD;  Location: [place];  Service: Urology;  Laterality: Right;   EYE SURGERY     cataract surgery   fatty mass removal  1999   pubic area   HOLMIUM LASER APPLICATION N/A 0/17/5102   Procedure: HOLMIUM LASER APPLICATION;  Surgeon: Kathie Rhodes, MD;  Location: [facility];  Service: Urology;  Laterality: N/A;   HOLMIUM LASER APPLICATION Right 5/85/2778   Procedure: HOLMIUM LASER APPLICATION;  Surgeon: Kathie Rhodes, MD;  Location: [facility];  Service: Urology;  Laterality: Right;   HOLMIUM LASER APPLICATION Left 2/42/3536   Procedure: HOLMIUM LASER APPLICATION;  Surgeon: Kathie Rhodes, MD;  Location: [facility];  Service: Urology;  Laterality: Left;   I & D EXTREMITY Left 12/19/2020   Procedure: IRRIGATION AND DEBRIDEMENT OF LEFT HIP HEMATOMA WITH APPLICATION OF WOUND VAC;  Surgeon: Erle Crocker, MD;  Location: [place];  Service: Orthopedics;  Laterality: Left;   I & D EXTREMITY Left 02/06/2021   Procedure: IRRIGATION AND DEBRIDEMENT DEEP ABCESS LEFT THIGH, SECONDARY CLOSURE OF WOUND DEHISCENCE;  Surgeon: Erle Crocker,  MD;  Location: [place];  Service: Orthopedics;  Laterality: Left;   IR FLUORO GUIDE PORT INSERTION RIGHT  02/24/2017   IR NEPHROSTOMY PLACEMENT RIGHT  11/17/2017   IR PATIENT EVAL TECH 0-60 MINS  03/11/2017   IR REMOVAL TUN ACCESS W/ PORT W/O FL MOD SED  04/20/2018   IR US GUIDE VASC ACCESS RIGHT  02/24/2017   KNEE ARTHROSCOPY     bilateral   LAPAROSCOPIC GASTRIC BANDING  07/10/2010   LAPAROSCOPIC GASTRIC BANDING     Laparoscopic adjustable banding APS System with posterior hiatal hernia, 2 suture.   LAPAROTOMY     for ruptured ovary and ovarian artery    NEPHROLITHOTOMY Right 11/17/2017   Procedure: NEPHROLITHOTOMY PERCUTANEOUS;  Surgeon: Kathie Rhodes, MD;  Location: [facility];  Service: Urology;  Laterality: Right;   PACEMAKER INSERTION  03/10/2013   MDT dual chamber PPM   POCKET REVISION N/A 12/08/2013   Procedure: POCKET REVISION;  Surgeon: Deboraha Sprang, MD;  Location: [facility] CATH LAB;  Service: Cardiovascular;  Laterality: N/A;   PORTA CATH INSERTION     REVERSE SHOULDER ARTHROPLASTY Right 05/14/2018   Procedure: RIGHT REVERSE SHOULDER ARTHROPLASTY;  Surgeon: Tania Ade, MD;  Location: [place];  Service: Orthopedics;  Laterality: Right;   REVERSE SHOULDER REPLACEMENT Right 05/14/2018   RIGHT HEART CATH N/A 07/21/2019   Procedure: RIGHT HEART CATH;  Surgeon: Larey Dresser, MD;  Location: [place] CV LAB;  Service: Cardiovascular;  Laterality: N/A;   TEE WITH CARDIOVERSION  09/22/2017   TEE WITHOUT CARDIOVERSION N/A 10/03/2017   Procedure: TRANSESOPHAGEAL ECHOCARDIOGRAM (TEE);  Surgeon: Sanda Klein, MD;  Location: [facility] ENDOSCOPY;  Service: Cardiovascular;  Laterality: N/A;   TEE WITHOUT CARDIOVERSION N/A 08/04/2019   Procedure: TRANSESOPHAGEAL ECHOCARDIOGRAM (TEE);  Surgeon: Larey Dresser, MD;  Location: [facility] ENDOSCOPY;  Service: Cardiovascular;  Laterality: N/A;   TEE WITHOUT CARDIOVERSION N/A 12/10/2019   Procedure: TRANSESOPHAGEAL ECHOCARDIOGRAM (TEE);  Surgeon: Buford Dresser, MD;  Location: [facility] ENDOSCOPY;  Service: Cardiovascular;  Laterality: N/A;   TEE WITHOUT CARDIOVERSION N/A 03/09/2021   Procedure: TRANSESOPHAGEAL ECHOCARDIOGRAM (TEE);  Surgeon: Pixie Casino, MD;  Location: [place];  Service: Cardiovascular;  Laterality: N/A;   THYROIDECTOMY  1998   Dr Margot Chimes   TONSILLECTOMY     TOTAL KNEE ARTHROPLASTY  04/13/2012   Procedure: TOTAL KNEE ARTHROPLASTY;  Surgeon: Rudean Haskell, MD;  Location: [place];  Service: Orthopedics;  Laterality: Right;   VIDEO BRONCHOSCOPY WITH ENDOBRONCHIAL ULTRASOUND N/A 02/07/2017   Procedure: VIDEO BRONCHOSCOPY WITH ENDOBRONCHIAL ULTRASOUND;  Surgeon: Marshell Garfinkel, MD;  Location: [place];  Service: Pulmonary;  Laterality: N/A;    There were no vitals filed for this visit.   Subjective Assessment - 08/13/21 1031     Subjective  Pt reports not sleeping well last night, falling asleep at 10 and waking up at 2 with difficulty returning to sleep.  Pt reports feeling "light headed" today with difficulty standing after prolonged sitting.    Pertinent History h/o hyperlipidemia, atrial fibrillation with pacemaker on Eliquis, lymphoma, confusion, subcortical hemorrhage on the left with intraventricular extension    Currently in Pain? No/denies    Pain Onset In the past 7 days              Cervical, scapular, shoulder flexion exercises in sitting for scapular strengthening and increased shoulder flexion.  Completed 1 set of 10 each bilaterally.  Utilized therapy ball for increased focus on shoulder flexion and educated on carryover of task to towel slides at home.  HEP provided and educated on completion of tasks to continue to focus on core and shoulder strengthening and stabilization as needed to engage in ADLs and IADLs.  Engaged in standing activity with focus on increased functional reaching.  Pt standing ~7 mins without seated rest break, however reports "spots" in vision but requesting to complete task before  sitting.  Pt demonstrating increased shoulder flexion to 140* when reaching in to cabinet to retrieve items.    Pt with difficulty with sit > stand without use of hands after prolonged sitting.  Pt also very limited by fatigue this session.     Access Code: [email or web address] URL: [email or web address] Date: 08/13/2021 Prepared by: Simonne Come  Exercises Seated Cervical Rotation AROM - 2 x daily - 7 x weekly - 3 sets - 10 reps Seated Cervical  Sidebending Stretch - 2 x daily - 7 x weekly - 3 sets - 10 reps Seated Scapular Retraction - 2 x daily - 7 x weekly - 3 sets - 10 reps Crossed Arm Scapular Retraction - 2 x daily - 7 x weekly - 3 sets - 10 reps Seated Shoulder Flexion Towel Slide at Table Top - 2 x daily - 7 x weekly - 3 sets - 10 reps                OT Short Term Goals - 07/25/21 1022       OT SHORT TERM GOAL #1   Title Pt will verbalize understanding of 3 compensatory strategies for low vision.    Time 4    Period Weeks    Status On-going    Target Date 08/15/21      OT SHORT TERM GOAL #2   Title Pt will improve hand strength and endurance to improve handwriting legiblity to 90%    Time 4    Period Weeks    Status On-going      OT SHORT TERM GOAL #3   Title Pt will demonstrate improved activity tolerance to complete standing acitivity for 15 mins without seated rest break.    Time 4    Period Weeks    Status On-going      OT SHORT TERM GOAL #4   Title Pt will improve shoulder ROM to 140* in RUE to increase functional reach in to cabinets    Time 4    Period Weeks    Status On-going               OT Long Term Goals - 07/25/21 1022       OT LONG TERM GOAL #1   Title Pt will be independent in energy conservation strategies.    Time 8    Period Weeks    Status On-going      OT LONG TERM GOAL #2   Title Pt will be independent in modifications for low vision.    Time 8    Period Weeks    Status On-going      OT LONG TERM GOAL #3    Title Pt wil be able to complete housekeeping task incorporating reaching outside BOS without reports of dizziness.    Time 8    Period Weeks    Status On-going      OT LONG TERM GOAL #4   Title Pt will be able to complete dual task activity without cues for sequencing and with improved safety awareness.    Time 8    Period Weeks    Status On-going                   Plan - 08/13/21 1603     Clinical Impression Statement Pt continues to be limited by fatigue and decreased endurance, benefiting from cues for activity tolerance/endurance and importance of energy conservation and recommendations for rest breaks to decrease fall risk.  Pt reports increased fatigue this date reporting that she did not sleep well overnight and has difficulty returning to sleep after awaking ~2am.  Pt is limited by fatigue in standing and onset of pain in neck and lower back with trunk rotation tasks.  Pt benefits from education on body mechanics and improved technique to complete ADLs and IADLs.    OT Occupational Profile and History Detailed Assessment- Review of Records and additional review of physical, cognitive, psychosocial history related to  current functional performance    Occupational performance deficits (Please refer to evaluation for details): ADL's;IADL's;Leisure;Social Participation    Body Structure / Function / Physical Skills ADL;Balance;Body mechanics;Cardiopulmonary status limiting activity;Coordination;Endurance;FMC;GMC;IADL;Mobility;Pain;ROM;Proprioception;Sensation;Strength;UE functional use;Vision    Psychosocial Skills Environmental  Adaptations;Routines and Behaviors    Rehab Potential Good    Clinical Decision Making Limited treatment options, no task modification necessary    Comorbidities Affecting Occupational Performance: May have comorbidities impacting occupational performance    Modification or Assistance to Complete Evaluation  No modification of tasks or assist  necessary to complete eval    OT Frequency 2x / week    OT Duration 8 weeks    OT Treatment/Interventions Self-care/ADL training;Cryotherapy;Electrical Stimulation;Ultrasound;Moist Heat;Fluidtherapy;Therapeutic exercise;Neuromuscular education;Energy conservation;Manual Therapy;Functional Mobility Training;DME and/or AE instruction;Passive range of motion;Patient/family education;Visual/perceptual remediation/compensation;Cognitive remediation/compensation;Therapeutic activities;Balance training;Psychosocial skills training;Coping strategies training    Plan handwriting to include passage or grocery list.  Continue gross and fine motor strengtening for handwriting.  Dynamic balance/mobility while addressing visual fields, as pt wants to return to driving.    Consulted and Agree with Plan of Care Patient             Patient will benefit from skilled therapeutic intervention in order to improve the following deficits and impairments:   Body Structure / Function / Physical Skills: ADL, Balance, Body mechanics, Cardiopulmonary status limiting activity, Coordination, Endurance, FMC, GMC, IADL, Mobility, Pain, ROM, Proprioception, Sensation, Strength, UE functional use, Vision   Psychosocial Skills: Environmental  Adaptations, Routines and Behaviors   Visit Diagnosis: Unsteadiness on feet  Muscle weakness (generalized)  Other abnormalities of gait and mobility  Attention and concentration deficit  Blindness, one eye, low vision other eye, unspecified eyes  Repeated falls    Problem List Patient Active Problem List   Diagnosis Date Noted   Hypertension    Pressure injury of skin 03/15/2021   Intracranial hemorrhage ([place]) 03/14/2021   Lower extremity edema    Polymicrobial bacterial infection    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    Enterococcus faecalis infection    Sepsis due to Escherichia coli ([place])    Debility 02/13/2021   Infection of wound hematoma 02/05/2021    Physical debility 12/22/2020   Tear of left hamstring 12/22/2020   Labral tear of left hip joint 12/22/2020   Acute blood loss anemia    Orthostatic hypotension 12/17/2020   History of CVA (cerebrovascular accident) 12/17/2020   Fall 12/15/2020   Hip hematoma, left, initial encounter 12/15/2020   Nondisplaced fracture of coronoid process of left ulna, initial encounter for closed fracture 12/15/2020   Multiple rib fractures 12/15/2020   COVID-19 virus infection 10/12/2020   Constipation 06/22/2020   Change in bowel habits 06/22/2020   Diverticulosis 06/22/2020   Dark stools 06/22/2020   Hemorrhoids 06/22/2020   Pacemaker 01/23/2020   Junctional rhythm 01/23/2020   Palpitations 01/23/2020   ICH (intracerebral hemorrhage) ([place]) 12/02/2019   A-fib ([place]) 12/02/2019   Anemia 12/02/2019   QT prolongation 12/02/2019   Hypothyroidism 12/02/2019   Gait abnormality 07/10/2018   S/P reverse total shoulder arthroplasty, 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   Persistent atrial fibrillation ([place])    Bilateral ureteral calculi 11/17/2017   Atrial fibrillation with RVR ([place]) 09/15/2017   Atrial flutter ([place]) 07/17/2017   Port catheter in place 04/02/2017   Non-Hodgkin lymphoma, unspecified, intrathoracic lymph nodes ([place]) 02/13/2017   Mediastinal mass 02/06/2017   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   Abnormal chest x-ray    Lung mass 02/03/2017   Superior vena  cava syndrome 02/03/2017   OSA (obstructive sleep apnea) 02/03/2015   History of renal calculi 11/16/2014   Renal calculi 11/16/2014   Family history of colon cancer 10/26/2014   Mechanical complication due to cardiac pacemaker pulse generator 11/06/2013   Dyspnea 05/12/2013   Hypothyroidism 04/21/2013   Pleural effusion 04/19/2013   Long term (current) use of anticoagulants 12/20/2010   EPIDERMOID CYST 08/22/2010   Chronic diastolic heart failure ([place]) 08/06/2010   SYNCOPE AND COLLAPSE 07/27/2010   OSTEOARTHRITIS, KNEE, RIGHT  03/15/2010   Class 1 obesity due to excess calories with body mass index (BMI) of 33.0 to 33.9 in adult 12/07/2009   NONSPEC ELEVATION OF LEVELS OF TRANSAMINASE/LDH 09/08/2009   BREAST CANCER, HX OF 07/25/2009   COLONIC POLYPS, HX OF 07/25/2009   TUBULOVILLOUS ADENOMA, COLON 04/29/2008   HYPERGLYCEMIA, FASTING 04/29/2008   HYPERLIPIDEMIA 06/22/2007   CARCINOMA, THYROID GLAND, HX OF 06/22/2007    Simonne Come, OT/L 08/13/2021, 4:20 PM  [facility] [address], [place], [place], [address] Phone: [phone]   Fax:  [phone]  Name: KAMYIA THOMASON MRN: [id number] Date of Birth: 12-May-1946

## 2021-08-13 NOTE — Therapy (Signed)
[facility] [address] [place], [place], [address] Phone: [phone]   Fax:  [phone]  Physical Therapy Treatment  Patient Details  Name: Kayla Harrison MRN: [id number] Date of Birth: 28-Feb-1946 Referring Provider (PT): Garvin Fila, MD   Encounter Date: 08/13/2021   PT End of Session - 08/13/21 1014     Visit Number 7    Number of Visits 17    Date for PT Re-Evaluation 09/12/21    Authorization Type Medicare & MoO    PT Start Time [phone]   pt late   PT Stop Time 1014    PT Time Calculation (min) 36 min    Equipment Utilized During Treatment Gait belt    Activity Tolerance Patient tolerated treatment well;Other (comment);Patient limited by fatigue   limited by c/o lightheadedness   Behavior During Therapy [facility] for tasks assessed/performed             Past Medical History:  Diagnosis Date   Anemia    Arthritis    osteoarthritis - knees and right shoulder   Blood transfusion without reported diagnosis    Breast cancer ([place])    Dr Margot Chimes, total thyroidectomy- 1999- for cancer   Brucellosis 1964   Chronic bilateral pleural effusions    Colon polyp    Dr Earlean Shawl   Complication of anesthesia    Ketamine produces LSD reaction, bright colored nightmarish experience    Dyslipidemia    Endometriosis    Fibroids    H/O pleural effusion    s/p thoracentesis w 3245ml withdrawn   Hepatitis    [place] ?hepatitis    History of dysphagia    due to radiation therapy   History of hiatal hernia    small noted on PET scan   Hypertension    Hypothyroidism    Lung cancer, lower lobe ([place]) 01/2017   radiation RX completed 03/04/17; will start chemo 6/27, pt unaware of lung cancer   Morbid obesity ([place])    Status post lap band surgery   Nephrolithiasis    Non Hodgkin's lymphoma ([place])    on chemotherapy   Persistent atrial fibrillation ([place])    a. s/p PVI 2008 b. s/p convergent  ablation 9935 complicated by bradycardia requiring pacemaker implant   Personal history of radiation therapy    Presence of permanent cardiac pacemaker    Rotator cuff tear    Right   Stroke ([place])    2003- [place] x2   SVC syndrome    with lung mass and non hodgkins lymphoma   Thyroid cancer ([place]) 2000    Past Surgical History:  Procedure Laterality Date   ABDOMINAL HYSTERECTOMY  1983   afib ablation     a. 2008 PVI b. 2014 convergent ablation   APPENDECTOMY     BONE MARROW BIOPSY  02/21/2017   BREAST LUMPECTOMY Left 2010   [place]     2015- negative   CARDIOVERSION  10/09/2012   Procedure: CARDIOVERSION;  Surgeon: Minus Breeding, MD;  Location: [place];  Service: Cardiovascular;  Laterality: N/A;   CARDIOVERSION  10/09/2012   Procedure: CARDIOVERSION;  Surgeon: Minus Breeding, MD;  Location: [facility] ENDOSCOPY;  Service: Cardiovascular;  Laterality: N/A;  [name] gave the ok to add pt to the add on , but we must check to find out if the can add pt on at [phone] ( [phone])   CARDIOVERSION  N/A 11/20/2012   Procedure: CARDIOVERSION;  Surgeon: Fay Records, MD;  Location: [place];  Service: Cardiovascular;  Laterality: N/A;   CARDIOVERSION N/A 07/18/2017   Procedure: CARDIOVERSION;  Surgeon: Thayer Headings, MD;  Location: [place];  Service: Cardiovascular;  Laterality: N/A;   CARDIOVERSION N/A 10/03/2017   Procedure: CARDIOVERSION;  Surgeon: Sanda Klein, MD;  Location: [facility] ENDOSCOPY;  Service: Cardiovascular;  Laterality: N/A;   CARDIOVERSION N/A 01/07/2018   Procedure: CARDIOVERSION;  Surgeon: Acie Fredrickson Wonda Cheng, MD;  Location: [facility] ENDOSCOPY;  Service: Cardiovascular;  Laterality: N/A;   CARDIOVERSION N/A 12/10/2019   Procedure: CARDIOVERSION;  Surgeon: Buford Dresser, MD;  Location: [facility] ENDOSCOPY;  Service: Cardiovascular;  Laterality: N/A;   CARDIOVERSION N/A 03/09/2021   Procedure: CARDIOVERSION;  Surgeon: Pixie Casino, MD;  Location: [place];   Service: Cardiovascular;  Laterality: N/A;   CHOLECYSTECTOMY     COLONOSCOPY W/ POLYPECTOMY     Dr Earlean Shawl   CYSTOSCOPY N/A 02/06/2015   Procedure: CYSTOSCOPY;  Surgeon: Kathie Rhodes, MD;  Location: [facility];  Service: Urology;  Laterality: N/A;   CYSTOSCOPY W/ RETROGRADES Left 11/17/2017   Procedure: CYSTOSCOPY WITH RETROGRADE /PYELOGRAM/;  Surgeon: Kathie Rhodes, MD;  Location: [facility];  Service: Urology;  Laterality: Left;   CYSTOSCOPY WITH RETROGRADE PYELOGRAM, URETEROSCOPY AND STENT PLACEMENT Right 02/06/2015   Procedure: RETROGRADE PYELOGRAM, RIGHT URETEROSCOPY STENT PLACEMENT;  Surgeon: Kathie Rhodes, MD;  Location: [facility];  Service: Urology;  Laterality: Right;   CYSTOSCOPY WITH RETROGRADE PYELOGRAM, URETEROSCOPY AND STENT PLACEMENT Right 03/07/2017   Procedure: CYSTOSCOPY WITH RIGHT RETROGRADE PYELOGRAM,RIGHT  URETEROSCOPYLASER LITHOTRIPSY  AND STENT PLACEMENT AND STONE BASKETRY;  Surgeon: Kathie Rhodes, MD;  Location: [place];  Service: Urology;  Laterality: Right;   EYE SURGERY     cataract surgery   fatty mass removal  1999   pubic area   HOLMIUM LASER APPLICATION N/A 8/63/8177   Procedure: HOLMIUM LASER APPLICATION;  Surgeon: Kathie Rhodes, MD;  Location: [facility];  Service: Urology;  Laterality: N/A;   HOLMIUM LASER APPLICATION Right 10/08/5788   Procedure: HOLMIUM LASER APPLICATION;  Surgeon: Kathie Rhodes, MD;  Location: [facility];  Service: Urology;  Laterality: Right;   HOLMIUM LASER APPLICATION Left 3/83/3383   Procedure: HOLMIUM LASER APPLICATION;  Surgeon: Kathie Rhodes, MD;  Location: [facility];  Service: Urology;  Laterality: Left;   I & D EXTREMITY Left 12/19/2020   Procedure: IRRIGATION AND DEBRIDEMENT OF LEFT HIP HEMATOMA WITH APPLICATION OF WOUND VAC;  Surgeon: Erle Crocker, MD;  Location: [place];  Service: Orthopedics;  Laterality: Left;   I & D EXTREMITY Left 02/06/2021   Procedure: IRRIGATION AND DEBRIDEMENT DEEP ABCESS LEFT THIGH,  SECONDARY CLOSURE OF WOUND DEHISCENCE;  Surgeon: Erle Crocker, MD;  Location: [place];  Service: Orthopedics;  Laterality: Left;   IR FLUORO GUIDE PORT INSERTION RIGHT  02/24/2017   IR NEPHROSTOMY PLACEMENT RIGHT  11/17/2017   IR PATIENT EVAL TECH 0-60 MINS  03/11/2017   IR REMOVAL TUN ACCESS W/ PORT W/O FL MOD SED  04/20/2018   IR US GUIDE VASC ACCESS RIGHT  02/24/2017   KNEE ARTHROSCOPY     bilateral   LAPAROSCOPIC GASTRIC BANDING  07/10/2010   LAPAROSCOPIC GASTRIC BANDING     Laparoscopic adjustable banding APS System with posterior hiatal hernia, 2 suture.   LAPAROTOMY     for ruptured ovary and ovarian artery    NEPHROLITHOTOMY Right 11/17/2017   Procedure: NEPHROLITHOTOMY PERCUTANEOUS;  Surgeon: Kathie Rhodes,  MD;  Location: [facility];  Service: Urology;  Laterality: Right;   PACEMAKER INSERTION  03/10/2013   MDT dual chamber PPM   POCKET REVISION N/A 12/08/2013   Procedure: POCKET REVISION;  Surgeon: Deboraha Sprang, MD;  Location: [facility] CATH LAB;  Service: Cardiovascular;  Laterality: N/A;   PORTA CATH INSERTION     REVERSE SHOULDER ARTHROPLASTY Right 05/14/2018   Procedure: RIGHT REVERSE SHOULDER ARTHROPLASTY;  Surgeon: Tania Ade, MD;  Location: [place];  Service: Orthopedics;  Laterality: Right;   REVERSE SHOULDER REPLACEMENT Right 05/14/2018   RIGHT HEART CATH N/A 07/21/2019   Procedure: RIGHT HEART CATH;  Surgeon: Larey Dresser, MD;  Location: [place] CV LAB;  Service: Cardiovascular;  Laterality: N/A;   TEE WITH CARDIOVERSION  09/22/2017   TEE WITHOUT CARDIOVERSION N/A 10/03/2017   Procedure: TRANSESOPHAGEAL ECHOCARDIOGRAM (TEE);  Surgeon: Sanda Klein, MD;  Location: [facility] ENDOSCOPY;  Service: Cardiovascular;  Laterality: N/A;   TEE WITHOUT CARDIOVERSION N/A 08/04/2019   Procedure: TRANSESOPHAGEAL ECHOCARDIOGRAM (TEE);  Surgeon: Larey Dresser, MD;  Location: [facility] ENDOSCOPY;  Service: Cardiovascular;  Laterality: N/A;   TEE WITHOUT CARDIOVERSION N/A 12/10/2019   Procedure:  TRANSESOPHAGEAL ECHOCARDIOGRAM (TEE);  Surgeon: Buford Dresser, MD;  Location: [facility] ENDOSCOPY;  Service: Cardiovascular;  Laterality: N/A;   TEE WITHOUT CARDIOVERSION N/A 03/09/2021   Procedure: TRANSESOPHAGEAL ECHOCARDIOGRAM (TEE);  Surgeon: Pixie Casino, MD;  Location: [place];  Service: Cardiovascular;  Laterality: N/A;   THYROIDECTOMY  1998   Dr Margot Chimes   TONSILLECTOMY     TOTAL KNEE ARTHROPLASTY  04/13/2012   Procedure: TOTAL KNEE ARTHROPLASTY;  Surgeon: Rudean Haskell, MD;  Location: [place];  Service: Orthopedics;  Laterality: Right;   VIDEO BRONCHOSCOPY WITH ENDOBRONCHIAL ULTRASOUND N/A 02/07/2017   Procedure: VIDEO BRONCHOSCOPY WITH ENDOBRONCHIAL ULTRASOUND;  Surgeon: Marshell Garfinkel, MD;  Location: [place];  Service: Pulmonary;  Laterality: N/A;    Vitals:   08/13/21 0943 08/13/21 1004  BP: (!) 129/96 (!) 133/101  Pulse: 99 (!) 103     Subjective Assessment - 08/13/21 0943     Subjective "Having one of the worst days." Feeling fatigued and walking slowly. This happens once in a while. Has been more lightheaded this weekend than normal. "borderline lightheaded" currently.    Pertinent History anemia, breast CA s/p surgery, lung CA s/p radiation and chemo, lymphoma on chemo, thyroid CA, HLD, hiatal hernia, HTN, a-fib, pacemaker, R reverse TSA 2019, stroke 2003, SVC syndrome, R TKA 2013    Diagnostic tests 03/15/21 head CT: Similar appearance of left caudothalamic hemorrhage with  intraventricular extension and mild adjacent edema. No new  hemorrhage. Unchanged mild trapping of the left lateral ventricle    Patient Stated Goals be able to get up off the floor    Currently in Pain? No/denies                               [facility] Adult PT Treatment/Exercise - 08/13/21 0001       Knee/Hip Exercises: Aerobic   Nustep L4 x 6 min (UEs/LEs)                 Balance Exercises - 08/13/21 0001       Balance Exercises: Standing   Stepping Strategy  Anterior;Posterior;10 reps;Limitations    Stepping Strategy Limitations R/L fwd/back step with variable UE support required on II bars   able to wean to B 1 finger support   Step Ups  Forward;UE support 1;Limitations;6 inch    Step Ups Limitations in II bars; 2x5 on each foot with 1 UE support; CGA-min A d/t difficulty    Sit to Stand Standard surface;Limitations    Sit to Stand Limitations 2x pushing off kness                  PT Short Term Goals - 07/27/21 1011       PT SHORT TERM GOAL #1   Title Patient to be independent with initial HEP.    Time 3    Period Weeks    Status Achieved    Target Date 08/08/21               PT Long Term Goals - 07/25/21 1224       PT LONG TERM GOAL #1   Title Patient to be independent with advanced HEP.    Time 8    Period Weeks    Status On-going      PT LONG TERM GOAL #2   Title Patient to demonstrate B LE strength >/=4+/5.    Time 8    Period Weeks    Status On-going      PT LONG TERM GOAL #3   Title Patient to score at least 46/56 on [place] in order to decrease risk of falls.    Time 8    Period Weeks    Status On-going      PT LONG TERM GOAL #4   Title Patient to complete TUG in <14 sec with LRAD in order to decrease risk of falls.    Time 8    Period Weeks    Status On-going      PT LONG TERM GOAL #5   Title Patient to demonstrate 5xSTS test in <15 sec in order to decrease risk of falls.    Time 8    Period Weeks    Status On-going      PT LONG TERM GOAL #6   Title Patient to demonstrate safe floor transfer with mod I.    Time 8    Period Weeks    Status On-going                   Plan - 08/13/21 1014     Clinical Impression Statement Patient arrived to session ambulating with 4WW with report of having "one of the worst days" d/t fatigue and walking slowly. Notes being lightheaded over the weekend and reports feeling "borderline lightheaded" at start of session. Continues to report trouble  sleeping. Worked on step ups and stepping strategy with UE support. Patient required cues to increase step height and toe clearance. Demonstrated weakness and difficulty completing step ups d/t weakness and fatigue. Intermittent sitting water/rest breaks required throughout session d/t fatigue and c/o "wooziness." Vitals remained slightly hypertensive throughout session but denied red flag symptoms. Patient reported symptoms no worse at end of session.    Comorbidities anemia, breast CA s/p surgery, lung CA s/p radiation and chemo, lymphoma on chemo, thyroid CA, HLD, hiatal hernia, HTN, a-fib, pacemaker, R reverse TSA 2019, stroke 2003, SVC syndrome, R TKA 2013    PT Treatment/Interventions ADLs/Self Care Home Management;Cryotherapy;Electrical Stimulation;DME Instruction;Moist Heat;Gait training;Stair training;Functional mobility training;Therapeutic activities;Therapeutic exercise;Balance training;Neuromuscular re-education;Manual techniques;Patient/family education;Passive range of motion;Dry needling;Energy conservation;Vestibular;Vasopneumatic Device;Taping    PT Next Visit Plan balance training with SLS activities; progress LE stretching and strengthening to improve posture and gait deviations; continue gait training with SPC    Consulted  and Agree with Plan of Care Patient             Patient will benefit from skilled therapeutic intervention in order to improve the following deficits and impairments:  Abnormal gait, Decreased range of motion, Difficulty walking, Increased fascial restricitons, Increased muscle spasms, Decreased safety awareness, Decreased endurance, Cardiopulmonary status limiting activity, Decreased activity tolerance, Decreased balance, Impaired flexibility, Improper body mechanics, Postural dysfunction, Decreased strength  Visit Diagnosis: Unsteadiness on feet  Muscle weakness (generalized)  Other abnormalities of gait and mobility     Problem List Patient Active  Problem List   Diagnosis Date Noted   Hypertension    Pressure injury of skin 03/15/2021   Intracranial hemorrhage (HCC) 03/14/2021   Lower extremity edema    Polymicrobial bacterial infection    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    Enterococcus faecalis infection    Sepsis due to Escherichia coli ([place])    Debility 02/13/2021   Infection of wound hematoma 02/05/2021   Physical debility 12/22/2020   Tear of left hamstring 12/22/2020   Labral tear of left hip joint 12/22/2020   Acute blood loss anemia    Orthostatic hypotension 12/17/2020   History of CVA (cerebrovascular accident) 12/17/2020   Fall 12/15/2020   Hip hematoma, left, initial encounter 12/15/2020   Nondisplaced fracture of coronoid process of left ulna, initial encounter for closed fracture 12/15/2020   Multiple rib fractures 12/15/2020   COVID-19 virus infection 10/12/2020   Constipation 06/22/2020   Change in bowel habits 06/22/2020   Diverticulosis 06/22/2020   Dark stools 06/22/2020   Hemorrhoids 06/22/2020   Pacemaker 01/23/2020   Junctional rhythm 01/23/2020   Palpitations 01/23/2020   ICH (intracerebral hemorrhage) ([place]) 12/02/2019   A-fib ([place]) 12/02/2019   Anemia 12/02/2019   QT prolongation 12/02/2019   Hypothyroidism 12/02/2019   Gait abnormality 07/10/2018   S/P reverse total shoulder arthroplasty, 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   Persistent atrial fibrillation ([place])    Bilateral ureteral calculi 11/17/2017   Atrial fibrillation with RVR ([place]) 09/15/2017   Atrial flutter ([place]) 07/17/2017   Port catheter in place 04/02/2017   Non-Hodgkin lymphoma, unspecified, intrathoracic lymph nodes ([place]) 02/13/2017   Mediastinal mass 02/06/2017   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   Abnormal chest x-ray    Lung mass 02/03/2017   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   OSA (obstructive sleep apnea) 02/03/2015   History of renal calculi 11/16/2014   Renal calculi 11/16/2014   Family history of colon  cancer 10/26/2014   Mechanical complication due to cardiac pacemaker pulse generator 11/06/2013   Dyspnea 05/12/2013   Hypothyroidism 04/21/2013   Pleural effusion 04/19/2013   Long term (current) use of anticoagulants 12/20/2010   EPIDERMOID CYST 08/22/2010   Chronic diastolic heart failure ([place]) 08/06/2010   SYNCOPE AND COLLAPSE 07/27/2010   OSTEOARTHRITIS, KNEE, RIGHT 03/15/2010   Class 1 obesity due to excess calories with body mass index (BMI) of 33.0 to 33.9 in adult 12/07/2009   NONSPEC ELEVATION OF LEVELS OF TRANSAMINASE/LDH 09/08/2009   BREAST CANCER, HX OF 07/25/2009   COLONIC POLYPS, HX OF 07/25/2009   TUBULOVILLOUS ADENOMA, COLON 04/29/2008   HYPERGLYCEMIA, FASTING 04/29/2008   HYPERLIPIDEMIA 06/22/2007   CARCINOMA, THYROID GLAND, HX OF 06/22/2007     Janene Harvey, PT, DPT 08/13/21 10:16 AM   [facility] [address], [place], [place], [address] Phone: [phone]   Fax:  [phone]  Name: Kayla Harrison MRN: [id number] Date of Birth:  12/19/1945    

## 2021-08-15 ENCOUNTER — Encounter: Payer: Self-pay | Admitting: Physical Therapy

## 2021-08-15 ENCOUNTER — Ambulatory Visit: Payer: Medicare Other | Admitting: Occupational Therapy

## 2021-08-15 ENCOUNTER — Ambulatory Visit: Payer: Medicare Other | Admitting: Physical Therapy

## 2021-08-15 ENCOUNTER — Other Ambulatory Visit: Payer: Self-pay

## 2021-08-15 ENCOUNTER — Encounter: Payer: Self-pay | Admitting: Occupational Therapy

## 2021-08-15 VITALS — BP 118/101 | HR 105

## 2021-08-15 DIAGNOSIS — R2681 Unsteadiness on feet: Secondary | ICD-10-CM | POA: Diagnosis not present

## 2021-08-15 DIAGNOSIS — M6281 Muscle weakness (generalized): Secondary | ICD-10-CM | POA: Diagnosis not present

## 2021-08-15 DIAGNOSIS — R2689 Other abnormalities of gait and mobility: Secondary | ICD-10-CM

## 2021-08-15 DIAGNOSIS — H5410 Blindness, one eye, low vision other eye, unspecified eyes: Secondary | ICD-10-CM | POA: Diagnosis not present

## 2021-08-15 DIAGNOSIS — R41840 Attention and concentration deficit: Secondary | ICD-10-CM

## 2021-08-15 DIAGNOSIS — R296 Repeated falls: Secondary | ICD-10-CM | POA: Diagnosis not present

## 2021-08-15 IMAGING — MG DIGITAL DIAGNOSTIC UNILAT RIGHT W/ CAD
3 series · 3 of 3 positions shown · non-contrast
Comparison: Previous exam(s).

CLINICAL DATA: 73-year-old female recalled from screening mammogram
dated 01/05/2020 for right breast calcifications.

EXAM:
DIGITAL DIAGNOSTIC RIGHT MAMMOGRAM WITH CAD

[R ML (1 of 2)]
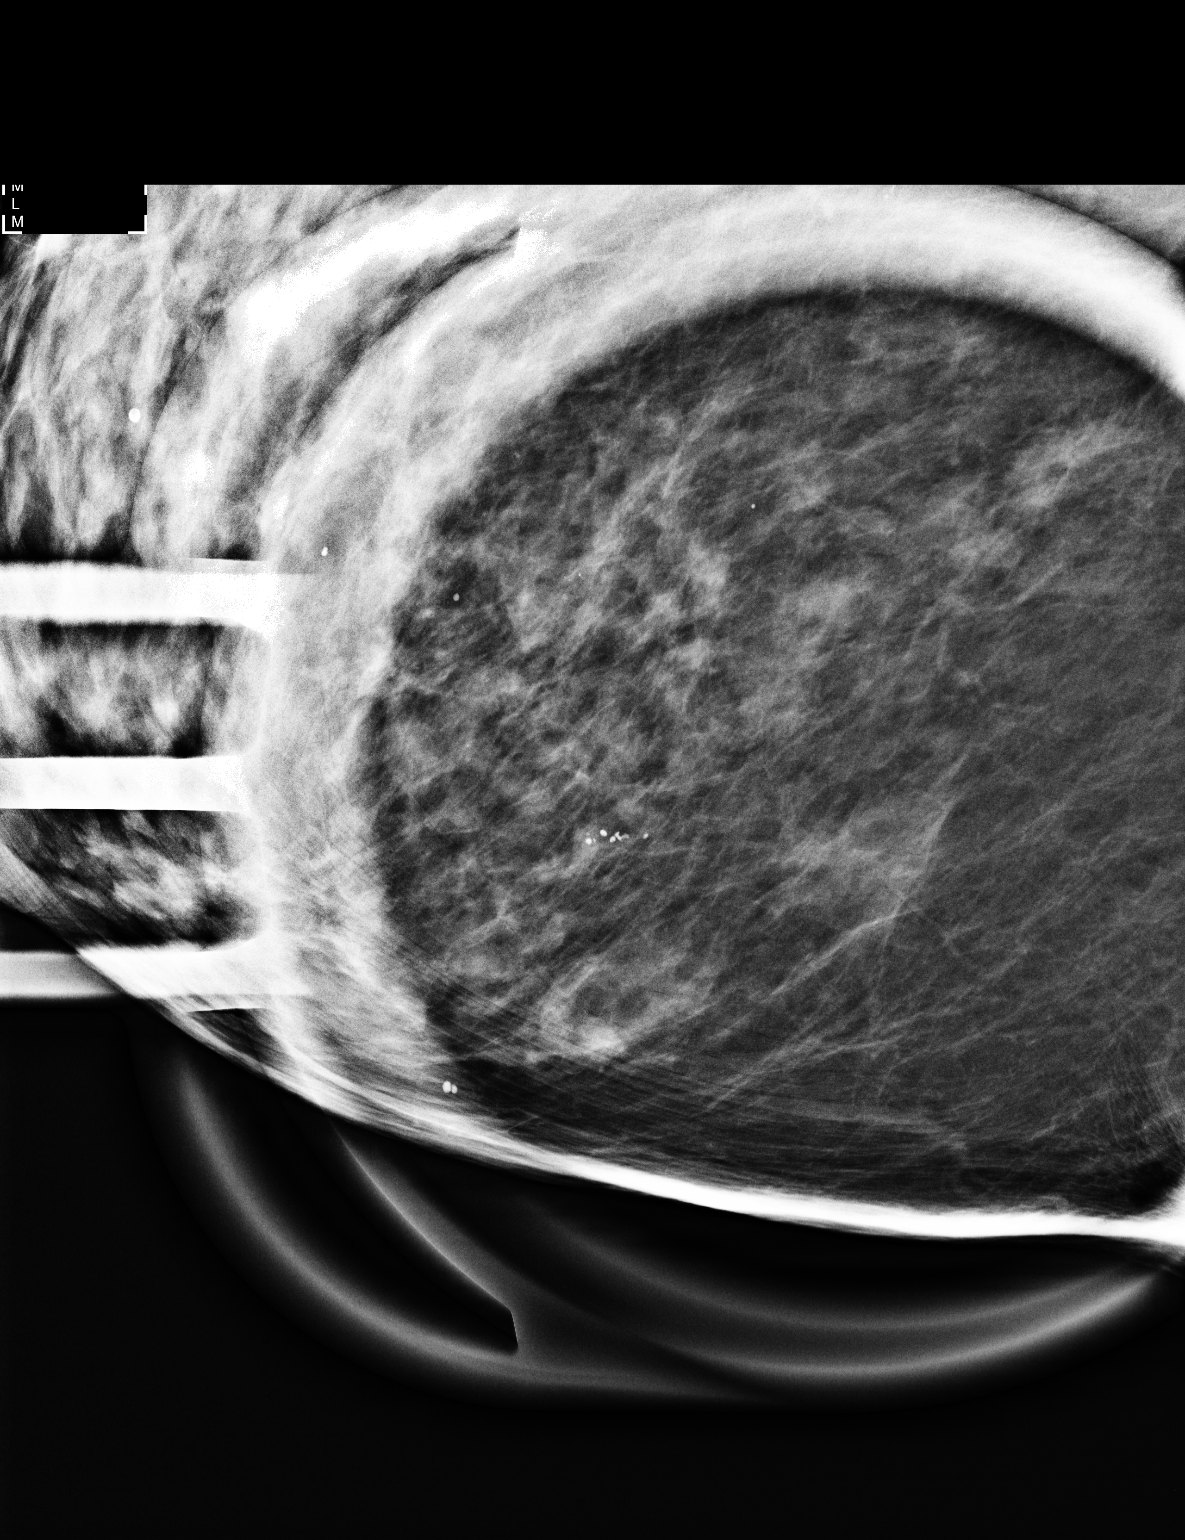

[R CC]
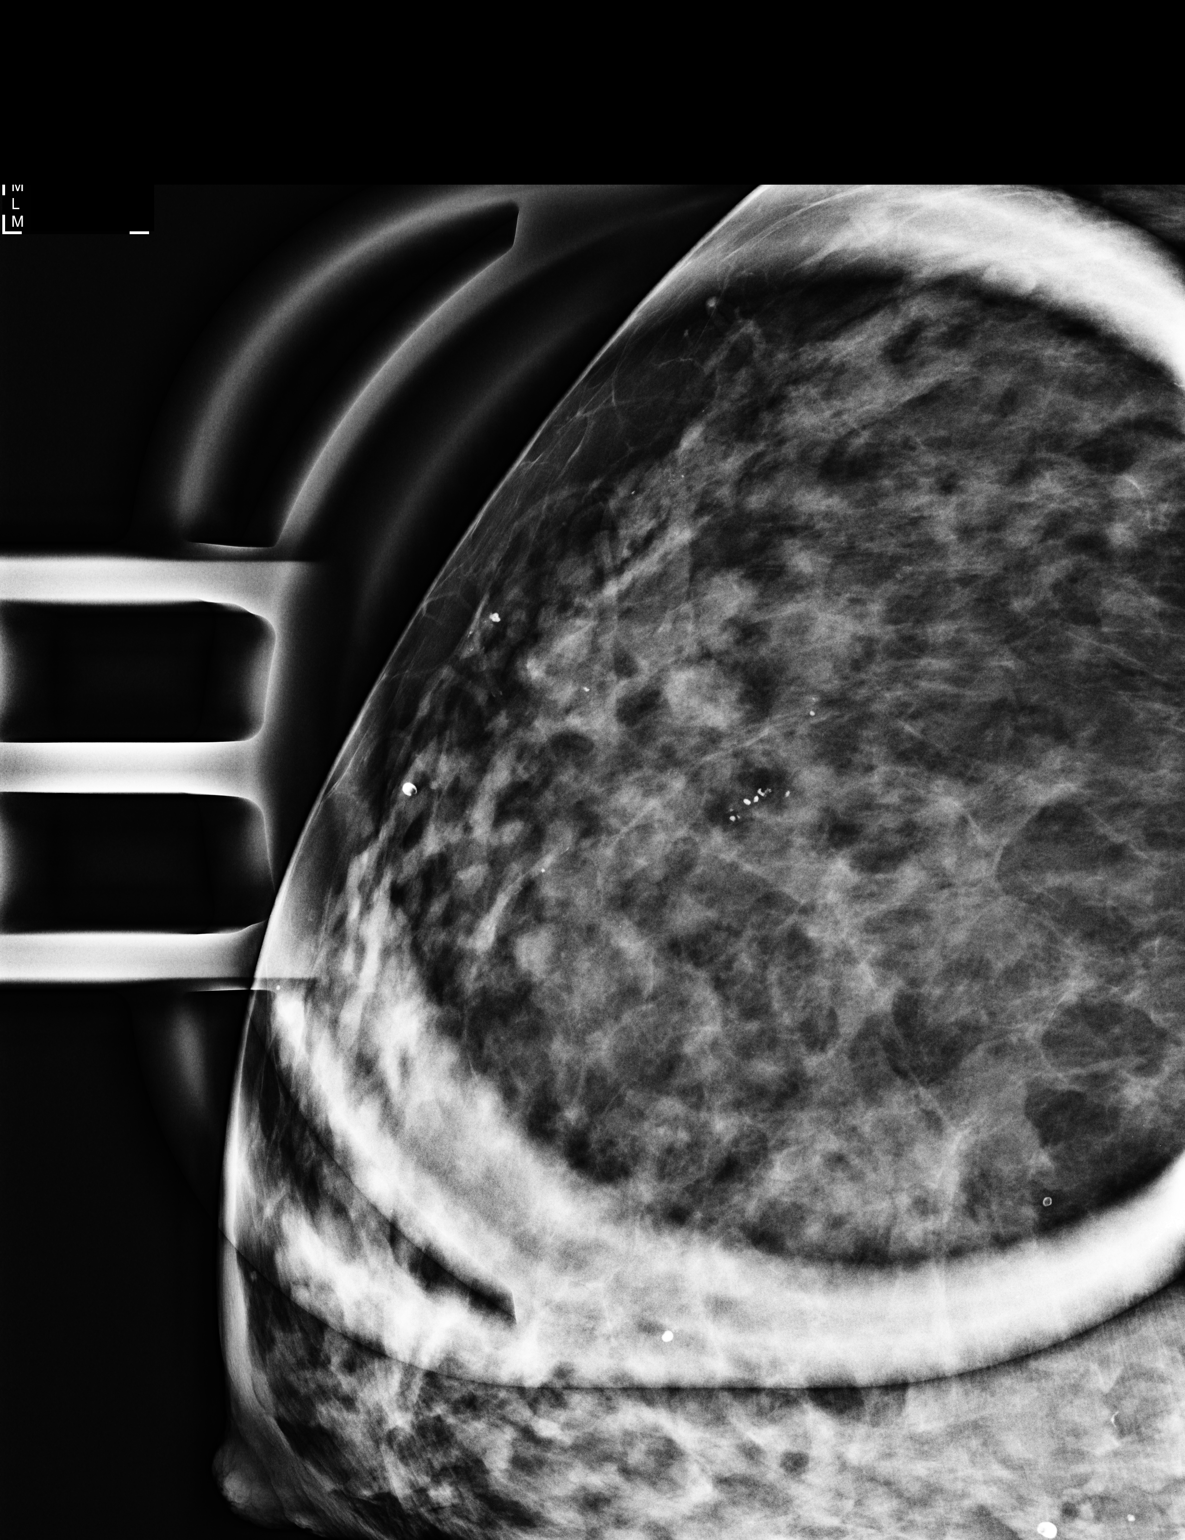

[R ML (2 of 2)]
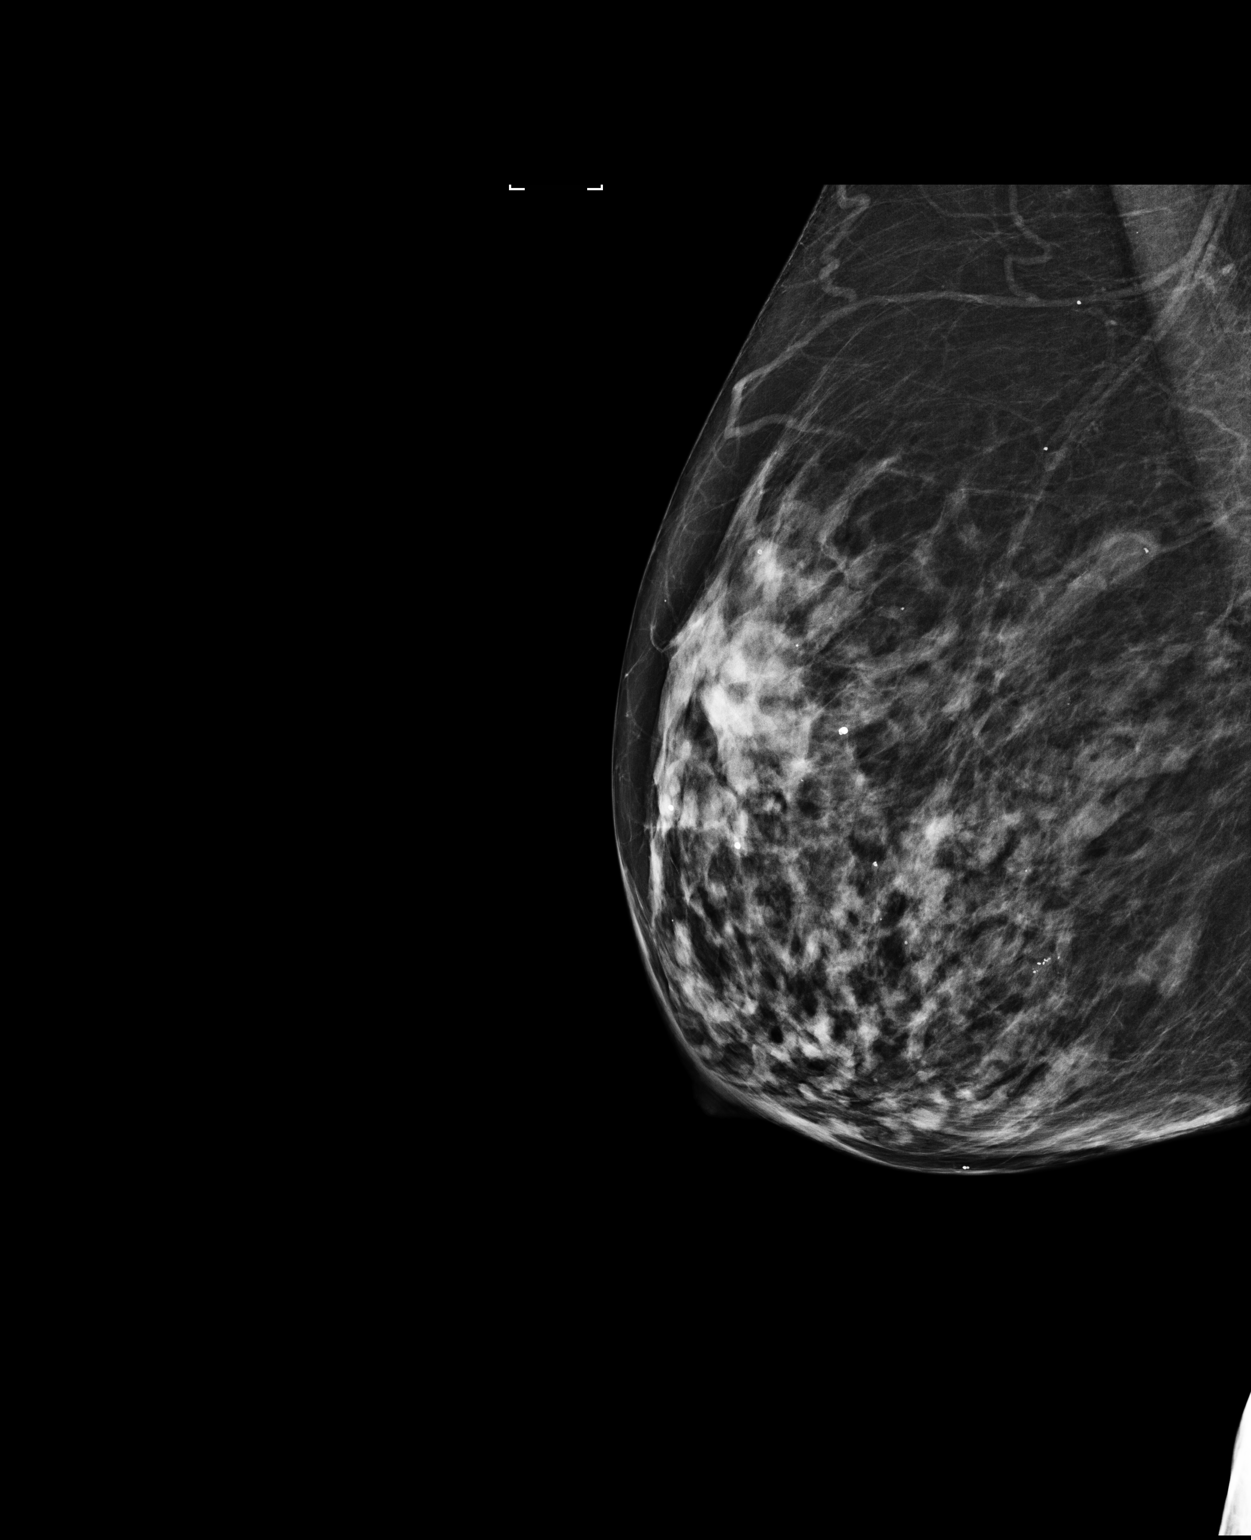

[3 of 3 positions shown; findings below may reference images not displayed]

ACR Breast Density Category c: The breast tissue is heterogeneously
dense, which may obscure small masses.
FINDINGS: A 5 mm group of calcifications in the lower outer right breast at
middle depth demonstrates round and likely early dystrophic
morphology. Numerous other round and dystrophic calcifications are
scattered throughout the bilateral breasts.

Mammographic images were processed with CAD.
IMPRESSION: Probably benign early dystrophic right breast calcifications.
Recommendation is for short-term mammographic follow-up.

RECOMMENDATION:
Diagnostic right breast mammogram in 6 months.

I have discussed the findings and recommendations with the patient.
If applicable, a reminder letter will be sent to the patient
regarding the next appointment.

BI-RADS CATEGORY  3: Probably benign.

## 2021-08-15 NOTE — Therapy (Signed)
[facility] [place] [address], [place], [place], [address] Phone: [phone]   Fax:  [phone]  Physical Therapy Treatment  Patient Details  Name: Kayla Harrison MRN: [id number] Date of Birth: 1946-06-09 Referring Provider (PT): Garvin Fila, MD   Encounter Date: 08/15/2021   PT End of Session - 08/15/21 1447     Visit Number 8    Number of Visits 17    Date for PT Re-Evaluation 09/12/21    Authorization Type Medicare & MoO    PT Start Time 1400    PT Stop Time 9390    PT Time Calculation (min) 45 min    Equipment Utilized During Treatment Gait belt    Activity Tolerance Patient tolerated treatment well;Patient limited by fatigue    Behavior During Therapy WFL for tasks assessed/performed             Past Medical History:  Diagnosis Date   Anemia    Arthritis    osteoarthritis - knees and right shoulder   Blood transfusion without reported diagnosis    Breast cancer ([place])    Dr Margot Chimes, total thyroidectomy- 1999- for cancer   Brucellosis 1964   Chronic bilateral pleural effusions    Colon polyp    Dr Earlean Shawl   Complication of anesthesia    Ketamine produces LSD reaction, bright colored nightmarish experience    Dyslipidemia    Endometriosis    Fibroids    H/O pleural effusion    s/p thoracentesis w 3226m withdrawn   Hepatitis    [place] ?hepatitis    History of dysphagia    due to radiation therapy   History of hiatal hernia    small noted on PET scan   Hypertension    Hypothyroidism    Lung cancer, lower lobe ([place] 01[place]   radiation RX completed 03/04/17; will start chemo 6/27, pt unaware of lung cancer   Morbid obesity ([place]    Status post lap band surgery   Nephrolithiasis    Non Hodgkin's lymphoma ([place]    on chemotherapy   Persistent atrial fibrillation ([place]    a. s/p PVI 2008 b. s/p convergent ablation 23009complicated by bradycardia requiring pacemaker  implant   Personal history of radiation therapy    Presence of permanent cardiac pacemaker    Rotator cuff tear    Right   [place]   SVC syndrome    with lung mass and non hodgkins lymphoma   Thyroid cancer ([place] 2000    Past Surgical History:  Procedure Laterality Date   ABDOMINAL HYSTERECTOMY  1983   afib ablation     a. 2008 PVI b. 2014 convergent ablation   APPENDECTOMY     BONE MARROW BIOPSY  02/21/2017   BREAST L[place]PECTOMY Left 2010   [place]    [place]- negative   CARDIOVERSION  10/09/2012   Procedure: CARDIOVERSION;  Surgeon: JMinus Breeding MD;  [place]  Service: Cardiovascular;  Laterality: N/A;   CARDIOVERSION  10/09/2012   Procedure: CARDIOVERSION;  Surgeon: JMinus Breeding MD;  [facility]  Service: Cardiovascular;  Laterality: N/A;  [name] the ok to add pt to the add on , but we must check to find out if the can add pt on at 1400 ([phone]   CARDIOVERSION N/A 11/20/2012   Procedure: CARDIOVERSION;  Surgeon: PNevin Bloodgood  Alcus Dad, MD;  Location: [place];  Service: Cardiovascular;  Laterality: N/A;   CARDIOVERSION N/A 07/18/2017   Procedure: CARDIOVERSION;  Surgeon: Thayer Headings, MD;  Location: [place];  Service: Cardiovascular;  Laterality: N/A;   CARDIOVERSION N/A 10/03/2017   Procedure: CARDIOVERSION;  Surgeon: Sanda Klein, MD;  [facility];  Service: Cardiovascular;  Laterality: N/A;   CARDIOVERSION N/A 01/07/2018   Procedure: CARDIOVERSION;  Surgeon: Acie Fredrickson Wonda Cheng, MD;  Location[facility];  Service: Cardiovascular;  Laterality: N/A;   CARDIOVERSION N/A 12/10/2019   Procedure: CARDIOVERSION;  Surgeon: Buford Dresser, MD;  Location: [facility] ENDOSCOPY;  Service: Cardiovascular;  Laterality: N/A;   CARDIOVERSION N/A 03/09/2021   Procedure: CARDIOVERSION;  Surgeon: Pixie Casino, MD;  Location: [place];  Service: Cardiovascular;  Laterality: N/A;    CHOLECYSTECTOMY     COLONOSCOPY W/ POLYPECTOMY     Dr Earlean Shawl   CYSTOSCOPY N/A 02/06/2015   Procedure: CYSTOSCOPY;  Surgeon: Kathie Rhodes, MD;  [facility];  Service: Urology;  Laterality: N/A;   CYSTOSCOPY W/ RETROGRADES Left 11/17/2017   Procedure: CYSTOSCOPY WITH RETROGRADE /PYELOGRAM/;  Surgeon: Kathie Rhodes, MD;  [facility];  Service: Urology;  Laterality: Left;   CYSTOSCOPY WITH RETROGRADE PYELOGRAM, URETEROSCOPY AND STENT PLACEMENT Right 02/06/2015   Procedure: RETROGRADE PYELOGRAM, RIGHT URETEROSCOPY STENT PLACEMENT;  Surgeon: Kathie Rhodes, MD;  [facility];  Service: Urology;  Laterality: Right;   CYSTOSCOPY WITH RETROGRADE PYELOGRAM, URETEROSCOPY AND STENT PLACEMENT Right 03/07/2017   Procedure: CYSTOSCOPY WITH RIGHT RETROGRADE PYELOGRAM,RIGHT  URETEROSCOPYLASER LITHOTRIPSY  AND STENT PLACEMENT AND STONE BASKETRY;  Surgeon: Kathie Rhodes, MD;  Location: [place];  Service: Urology;  Laterality: Right;   EYE SURGERY     cataract surgery   fatty mass removal  1999   pubic area   HOLMIUM LASER APPLICATION N/A 9/50/7225   Procedure: HOLMIUM LASER APPLICATION;  Surgeon: Kathie Rhodes, MD;  [facility];  Service: Urology;  Laterality: N/A;   HOLMIUM LASER APPLICATION Right 7/50/5183   Procedure: HOLMIUM LASER APPLICATION;  Surgeon: Kathie Rhodes, MD;  Location: [facility];  Service: Urology;  Laterality: Right;   HOLMIUM LASER APPLICATION Left 3/58/2518   Procedure: HOLMIUM LASER APPLICATION;  Surgeon: Kathie Rhodes, MD;  [facility];  Service: Urology;  Laterality: Left;   I & D EXTREMITY Left 12/19/2020   Procedure: IRRIGATION AND DEBRIDEMENT OF LEFT HIP HEMATOMA WITH APPLICATION OF WOUND VAC;  Surgeon: Erle Crocker, MD;  Location: [place];  Service: Orthopedics;  Laterality: Left;   I & D EXTREMITY Left 02/06/2021   Procedure: IRRIGATION AND DEBRIDEMENT DEEP ABCESS LEFT THIGH, SECONDARY CLOSURE OF WOUND DEHISCENCE;  Surgeon:  Erle Crocker, MD;  Location: [place];  Service: Orthopedics;  Laterality: Left;   IR FLUORO GUIDE PORT INSERTION RIGHT  02/24/2017   IR NEPHROSTOMY PLACEMENT RIGHT  11/17/2017   IR PATIENT EVAL TECH 0-60 MINS  03/11/2017   IR REMOVAL TUN ACCESS W/ PORT W/O FL MOD SED  04/20/2018   IR US GUIDE VASC ACCESS RIGHT  02/24/2017   KNEE ARTHROSCOPY     bilateral   LAPAROSCOPIC GASTRIC BANDING  07/10/2010   LAPAROSCOPIC GASTRIC BANDING     Laparoscopic adjustable banding APS System with posterior hiatal hernia, 2 suture.   LAPAROTOMY     for ruptured ovary and ovarian artery    NEPHROLITHOTOMY Right 11/17/2017   Procedure: NEPHROLITHOTOMY PERCUTANEOUS;  Surgeon: Kathie Rhodes, MD;  [facility];  Service: Urology;  Laterality: Right;   PACEMAKER INSERTION  03/10/2013   MDT dual chamber PPM   POCKET REVISION N/A 12/08/2013   Procedure: POCKET REVISION;  Surgeon: Deboraha Sprang, MD;  [facility] CATH LAB;  Service: Cardiovascular;  Laterality: N/A;   PORTA CATH INSERTION     REVERSE SHOULDER ARTHROPLASTY Right 05/14/2018   Procedure: RIGHT REVERSE SHOULDER ARTHROPLASTY;  Surgeon: Tania Ade, MD;  Location: [place];  Service: Orthopedics;  Laterality: Right;   REVERSE SHOULDER REPLACEMENT Right 05/14/2018   RIGHT HEART CATH N/A 07/21/2019   Procedure: RIGHT HEART CATH;  Surgeon: Larey Dresser, MD;  Location: [place] CV LAB;  Service: Cardiovascular;  Laterality: N/A;   TEE WITH CARDIOVERSION  09/22/2017   TEE WITHOUT CARDIOVERSION N/A 10/03/2017   Procedure: TRANSESOPHAGEAL ECHOCARDIOGRAM (TEE);  Surgeon: Sanda Klein, MD;  Location[facility] ENDOSCOPY;  Service: Cardiovascular;  Laterality: N/A;   TEE WITHOUT CARDIOVERSION N/A 08/04/2019   Procedure: TRANSESOPHAGEAL ECHOCARDIOGRAM (TEE);  Surgeon: Larey Dresser, MD;  Location: [facility] ENDOSCOPY;  Service: Cardiovascular;  Laterality: N/A;   TEE WITHOUT CARDIOVERSION N/A 12/10/2019   Procedure: TRANSESOPHAGEAL ECHOCARDIOGRAM (TEE);  Surgeon:  Buford Dresser, MD;  Location: [facility] ENDOSCOPY;  Service: Cardiovascular;  Laterality: N/A;   TEE WITHOUT CARDIOVERSION N/A 03/09/2021   Procedure: TRANSESOPHAGEAL ECHOCARDIOGRAM (TEE);  Surgeon: Pixie Casino, MD;  Location: [place];  Service: Cardiovascular;  Laterality: N/A;   THYROIDECTOMY  1998   Dr Margot Chimes   TONSILLECTOMY     TOTAL KNEE ARTHROPLASTY  04/13/2012   Procedure: TOTAL KNEE ARTHROPLASTY;  Surgeon: Rudean Haskell, MD;  Location: [place];  Service: Orthopedics;  Laterality: Right;   VIDEO BRONCHOSCOPY WITH ENDOBRONCHIAL ULTRASOUND N/A 02/07/2017   Procedure: VIDEO BRONCHOSCOPY WITH ENDOBRONCHIAL ULTRASOUND;  Surgeon: Marshell Garfinkel, MD;  Location: [place];  Service: Pulmonary;  Laterality: N/A;    Vitals:   08/15/21 1403  BP: (!) 118/101  Pulse: (!) 105     Subjective Assessment - 08/15/21 1349     Subjective Feeling like my walking is getting worse every day. Not feeling particularly fatigued today.    Pertinent History anemia, breast CA s/p surgery, lung CA s/p radiation and chemo, lymphoma on chemo, thyroid CA, HLD, hiatal hernia, HTN, a-fib, pacemaker, R reverse TSA 2019, stroke 2003, SVC syndrome, R TKA 2013    Diagnostic tests 03/15/21 head CT: Similar appearance of left caudothalamic hemorrhage with  intraventricular extension and mild adjacent edema. No new  hemorrhage. Unchanged mild trapping of the left lateral ventricle    Patient Stated Goals be able to get up off the floor    Currently in Pain? No/denies                               [facility] Adult PT Treatment/Exercise - 08/15/21 0001       Knee/Hip Exercises: Aerobic   Nustep L5 x 6 min (UEs/LEs)      Knee/Hip Exercises: Seated   Sit to Sand without UE support;1 set;10 reps      Knee/Hip Exercises: Supine   Bridges Strengthening;Both;1 set;10 reps    Bridges Limitations straight leg bridge on pball   PT assist to keep LEs on pball d/t core instability   Other Supine  Knee/Hip Exercises LTR 10x to tolerance    Other Supine Knee/Hip Exercises red TB clamshell 10x; alt bent knee fallout 10x; both with feet on dynadisc  Balance Exercises - 08/15/21 0001       Balance Exercises: Standing   Step Ups Forward;UE support 1;Limitations   3x each LE 6" and 8"; min A required on L LE d/t weakness   Step Ups Limitations in II bars; 2x5 on each foot with 1 UE support; CGA-min A d/t difficulty                  PT Short Term Goals - 07/27/21 1011       PT SHORT TERM GOAL #1   Title Patient to be independent with initial HEP.    Time 3    Period Weeks    Status Achieved    Target Date 08/08/21               PT Long Term Goals - 07/25/21 1224       PT LONG TERM GOAL #1   Title Patient to be independent with advanced HEP.    Time 8    Period Weeks    Status On-going      PT LONG TERM GOAL #2   Title Patient to demonstrate B LE strength >/=4+/5.    Time 8    Period Weeks    Status On-going      PT LONG TERM GOAL #3   Title Patient to score at least 46/56 on [place] in order to decrease risk of falls.    Time 8    Period Weeks    Status On-going      PT LONG TERM GOAL #4   Title Patient to complete TUG in <14 sec with LRAD in order to decrease risk of falls.    Time 8    Period Weeks    Status On-going      PT LONG TERM GOAL #5   Title Patient to demonstrate 5xSTS test in <15 sec in order to decrease risk of falls.    Time 8    Period Weeks    Status On-going      PT LONG TERM GOAL #6   Title Patient to demonstrate safe floor transfer with mod I.    Time 8    Period Weeks    Status On-going                   Plan - 08/15/21 1448     Clinical Impression Statement Patient arrived to session with report that she feels that her ability to walk is getting worse daily. However, reports that she is not feeling particularly fatigued today. Does report low appetite, memory issues, and R scapular  pain. Advised patient to discuss these concerns with her PCP by making an appointment. Worked on hip strengthening activities on mat with patient requiring cues to decrease speed and increase speed throughout. Demonstrated difficulty with more challenging bridging activities, requiring PT assist to maintain LEs on pball. Patient required verbal cues-min A to transfer from supine>sit. Transient report of dizziness upon sitting up which resolved in seconds. Patient demonstrated improved ability to perform STS today and able to increase step height with step ups despite difficulty on L LE. Patient without complaints at end of session.    Comorbidities anemia, breast CA s/p surgery, lung CA s/p radiation and chemo, lymphoma on chemo, thyroid CA, HLD, hiatal hernia, HTN, a-fib, pacemaker, R reverse TSA 2019, stroke 2003, SVC syndrome, R TKA 2013    PT Treatment/Interventions ADLs/Self Care Home Management;Cryotherapy;Electrical Stimulation;DME Instruction;Moist Heat;Gait training;Stair training;Functional mobility training;Therapeutic activities;Therapeutic exercise;Balance  training;Neuromuscular re-education;Manual techniques;Patient/family education;Passive range of motion;Dry needling;Energy conservation;Vestibular;Vasopneumatic Device;Taping    PT Next Visit Plan balance training with SLS activities; progress LE stretching and strengthening to improve posture and gait deviations; continue gait training with SPC    Consulted and Agree with Plan of Care Patient             Patient will benefit from skilled therapeutic intervention in order to improve the following deficits and impairments:  Abnormal gait, Decreased range of motion, Difficulty walking, Increased fascial restricitons, Increased muscle spasms, Decreased safety awareness, Decreased endurance, Cardiopulmonary status limiting activity, Decreased activity tolerance, Decreased balance, Impaired flexibility, Improper body mechanics, Postural  dysfunction, Decreased strength  Visit Diagnosis: Unsteadiness on feet  Other abnormalities of gait and mobility  Muscle weakness (generalized)     Problem List Patient Active Problem List   Diagnosis Date Noted   Hypertension    Pressure injury of skin 03/15/2021   Intracranial hemorrhage ([place]) 03/14/2021   Lower extremity edema    Polymicrobial bacterial infection    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    Enterococcus faecalis infection    Sepsis due to Escherichia coli ([place])    Debility 02/13/2021   Infection of wound hematoma 02/05/2021   Physical debility 12/22/2020   Tear of left hamstring 12/22/2020   Labral tear of left hip joint 12/22/2020   Acute blood loss anemia    Orthostatic hypotension 12/17/2020   History of CVA (cerebrovascular accident) 12/17/2020   Fall 12/15/2020   Hip hematoma, left, initial encounter 12/15/2020   Nondisplaced fracture of coronoid process of left ulna, initial encounter for closed fracture 12/15/2020   Multiple rib fractures 12/15/2020   COVID-19 virus infection 10/12/2020   Constipation 06/22/2020   Change in bowel habits 06/22/2020   Diverticulosis 06/22/2020   Dark stools 06/22/2020   Hemorrhoids 06/22/2020   Pacemaker 01/23/2020   Junctional rhythm 01/23/2020   Palpitations 01/23/2020   ICH (intracerebral hemorrhage) ([place]) 12/02/2019   A-fib ([place]) 12/02/2019   Anemia 12/02/2019   QT prolongation 12/02/2019   Hypothyroidism 12/02/2019   Gait abnormality 07/10/2018   S/P reverse total shoulder arthroplasty, 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   Persistent atrial fibrillation ([place])    Bilateral ureteral calculi 11/17/2017   Atrial fibrillation with RVR ([place]) 09/15/2017   Atrial flutter ([place]) 07/17/2017   Port catheter in place 04/02/2017   Non-Hodgkin lymphoma, unspecified, intrathoracic lymph nodes ([place]) 02/13/2017   Mediastinal mass 02/06/2017   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[place]) 02/06/2017   Abnormal chest x-ray    Lung mass  02/03/2017   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   OSA (obstructive sleep apnea) 02/03/2015   History of renal calculi 11/16/2014   Renal calculi 11/16/2014   Family history of colon cancer 10/26/2014   Mechanical complication due to cardiac pacemaker pulse generator 11/06/2013   Dyspnea 05/12/2013   Hypothyroidism 04/21/2013   Pleural effusion 04/19/2013   Long term (current) use of anticoagulants 12/20/2010   EPIDERMOID CYST 08/22/2010   Chronic diastolic heart failure ([place]) 08/06/2010   SYNCOPE AND COLLAPSE 07/27/2010   OSTEOARTHRITIS, KNEE, RIGHT 03/15/2010   Class 1 obesity due to excess calories with body mass index (BMI) of 33.0 to 33.9 in adult 12/07/2009   NONSPEC ELEVATION OF LEVELS OF TRANSAMINASE/LDH 09/08/2009   BREAST CANCER, HX OF 07/25/2009   COLONIC POLYPS, HX OF 07/25/2009   TUBULOVILLOUS ADENOMA, COLON 04/29/2008   HYPERGLYCEMIA, FASTING 04/29/2008   HYPERLIPIDEMIA 06/22/2007   CARCINOMA, THYROID GLAND, HX OF 06/22/2007  Janene Harvey, PT, DPT 08/15/21 3:42 PM   [facility] [address] [place], [place], [address] Phone: [phone]   Fax:  [phone]  Name: MARTENA EMANUELE MRN: [id number] Date of Birth: [birth year]

## 2021-08-15 NOTE — Therapy (Signed)
[facility] [address], [place], [place], [address] Phone: [phone]   Fax:  [phone]  Occupational Therapy Treatment  Patient Details  Name: Kayla Harrison MRN: [id number] Date of Birth: May 15, 1946 Referring Provider (OT): Leonie Man   Encounter Date: 08/15/2021   OT End of Session - 08/15/21 1315     Visit Number 8    Number of Visits 17    Date for OT Re-Evaluation 09/12/21    Authorization Type Medicare A and B    OT Start Time 1310    OT Stop Time 1400    OT Time Calculation (min) 50 min    Activity Tolerance Patient tolerated treatment well    Behavior During Therapy WFL for tasks assessed/performed             Past Medical History:  Diagnosis Date   Anemia    Arthritis    osteoarthritis - knees and right shoulder   Blood transfusion without reported diagnosis    Breast cancer ([place])    Dr Margot Chimes, total thyroidectomy- 1999- for cancer   Brucellosis 1964   Chronic bilateral pleural effusions    Colon polyp    Dr Earlean Shawl   Complication of anesthesia    Ketamine produces LSD reaction, bright colored nightmarish experience    Dyslipidemia    Endometriosis    Fibroids    H/O pleural effusion    s/p thoracentesis w 3247m withdrawn   Hepatitis    [place], ?hepatitis    History of dysphagia    due to radiation therapy   History of hiatal hernia    small noted on PET scan   Hypertension    Hypothyroidism    Lung cancer, lower lobe ([place]   radiation RX completed 03/04/17; will start chemo 6/27, pt unaware of lung cancer   Morbid obesity ([place]    Status post lap band surgery   Nephrolithiasis    Non Hodgkin's lymphoma ([place]    on chemotherapy   Persistent atrial fibrillation ([place]    a. s/p PVI 2008 b. s/p convergent ablation 28003complicated by bradycardia requiring pacemaker implant   Personal history of radiation therapy    Presence of permanent cardiac  pacemaker    Rotator cuff tear    Right   [place]   SVC syndrome    with lung mass and non hodgkins lymphoma   [place] cancer [place]    Past Surgical History:  Procedure Laterality Date   ABDOMINAL HYSTERECTOMY  1983   afib ablation     a. 2008 PVI b. 2014 convergent ablation   APPENDECTOMY     BONE MARROW BIOPSY  02/21/2017   BREAST LUMPECTOMY Left 2010   [place]    [place]- negative   CARDIOVERSION  10/09/2012   Procedure: CARDIOVERSION;  Surgeon: JMinus Breeding MD;  [place]  Service: Cardiovascular;  Laterality: N/A;   CARDIOVERSION  10/09/2012   Procedure: CARDIOVERSION;  Surgeon: JMinus Breeding MD;  [facility];  Service: Cardiovascular;  Laterality: N/A;  [name] the ok to add pt to the add on , but we must check to find out if the can add pt on at 1400 ([phone]   CARDIOVERSION N/A 11/20/2012   Procedure: CARDIOVERSION;  Surgeon: PFay Records MD;  [facility]  Service: Cardiovascular;  Laterality: N/A;  CARDIOVERSION N/A 07/18/2017   Procedure: CARDIOVERSION;  Surgeon: Acie Fredrickson, Wonda Cheng, MD;  Location: [place];  Service: Cardiovascular;  Laterality: N/A;   CARDIOVERSION N/A 10/03/2017   Procedure: CARDIOVERSION;  Surgeon: Sanda Klein, MD;  [facility];  Service: Cardiovascular;  Laterality: N/A;   CARDIOVERSION N/A 01/07/2018   Procedure: CARDIOVERSION;  Surgeon: Acie Fredrickson Wonda Cheng, MD;  Location[facility];  Service: Cardiovascular;  Laterality: N/A;   CARDIOVERSION N/A 12/10/2019   Procedure: CARDIOVERSION;  Surgeon: Buford Dresser, MD;  Location: [facility] ENDOSCOPY;  Service: Cardiovascular;  Laterality: N/A;   CARDIOVERSION N/A 03/09/2021   Procedure: CARDIOVERSION;  Surgeon: Pixie Casino, MD;  Location: [place];  Service: Cardiovascular;  Laterality: N/A;   CHOLECYSTECTOMY     COLONOSCOPY W/ POLYPECTOMY     Dr Earlean Shawl   CYSTOSCOPY N/A 02/06/2015    Procedure: CYSTOSCOPY;  Surgeon: Kathie Rhodes, MD;  [facility];  Service: Urology;  Laterality: N/A;   CYSTOSCOPY W/ RETROGRADES Left 11/17/2017   Procedure: CYSTOSCOPY WITH RETROGRADE /PYELOGRAM/;  Surgeon: Kathie Rhodes, MD;  [facility];  Service: Urology;  Laterality: Left;   CYSTOSCOPY WITH RETROGRADE PYELOGRAM, URETEROSCOPY AND STENT PLACEMENT Right 02/06/2015   Procedure: RETROGRADE PYELOGRAM, RIGHT URETEROSCOPY STENT PLACEMENT;  Surgeon: Kathie Rhodes, MD;  [facility];  Service: Urology;  Laterality: Right;   CYSTOSCOPY WITH RETROGRADE PYELOGRAM, URETEROSCOPY AND STENT PLACEMENT Right 03/07/2017   Procedure: CYSTOSCOPY WITH RIGHT RETROGRADE PYELOGRAM,RIGHT  URETEROSCOPYLASER LITHOTRIPSY  AND STENT PLACEMENT AND STONE BASKETRY;  Surgeon: Kathie Rhodes, MD;  Location: [place];  Service: Urology;  Laterality: Right;   EYE SURGERY     cataract surgery   fatty mass removal  1999   pubic area   HOLMIUM LASER APPLICATION N/A 7/89/3810   Procedure: HOLMIUM LASER APPLICATION;  Surgeon: Kathie Rhodes, MD;  [facility];  Service: Urology;  Laterality: N/A;   HOLMIUM LASER APPLICATION Right 1/75/1025   Procedure: HOLMIUM LASER APPLICATION;  Surgeon: Kathie Rhodes, MD;  Location: [facility];  Service: Urology;  Laterality: Right;   HOLMIUM LASER APPLICATION Left 8/52/7782   Procedure: HOLMIUM LASER APPLICATION;  Surgeon: Kathie Rhodes, MD;  [facility];  Service: Urology;  Laterality: Left;   I & D EXTREMITY Left 12/19/2020   Procedure: IRRIGATION AND DEBRIDEMENT OF LEFT HIP HEMATOMA WITH APPLICATION OF WOUND VAC;  Surgeon: Erle Crocker, MD;  Location: [place];  Service: Orthopedics;  Laterality: Left;   I & D EXTREMITY Left 02/06/2021   Procedure: IRRIGATION AND DEBRIDEMENT DEEP ABCESS LEFT THIGH, SECONDARY CLOSURE OF WOUND DEHISCENCE;  Surgeon: Erle Crocker, MD;  Location: [place];  Service: Orthopedics;  Laterality: Left;   IR  FLUORO GUIDE PORT INSERTION RIGHT  02/24/2017   IR NEPHROSTOMY PLACEMENT RIGHT  11/17/2017   IR PATIENT EVAL TECH 0-60 MINS  03/11/2017   IR REMOVAL TUN ACCESS W/ PORT W/O FL MOD SED  04/20/2018   IR US GUIDE VASC ACCESS RIGHT  02/24/2017   KNEE ARTHROSCOPY     bilateral   LAPAROSCOPIC GASTRIC BANDING  07/10/2010   LAPAROSCOPIC GASTRIC BANDING     Laparoscopic adjustable banding APS System with posterior hiatal hernia, 2 suture.   LAPAROTOMY     for ruptured ovary and ovarian artery    NEPHROLITHOTOMY Right 11/17/2017   Procedure: NEPHROLITHOTOMY PERCUTANEOUS;  Surgeon: Kathie Rhodes, MD;  [facility];  Service: Urology;  Laterality: Right;   PACEMAKER INSERTION  03/10/2013   MDT dual chamber PPM  POCKET REVISION N/A 12/08/2013   Procedure: POCKET REVISION;  Surgeon: Deboraha Sprang, MD;  [facility] LAB;  Service: Cardiovascular;  Laterality: N/A;   PORTA CATH INSERTION     REVERSE SHOULDER ARTHROPLASTY Right 05/14/2018   Procedure: RIGHT REVERSE SHOULDER ARTHROPLASTY;  Surgeon: Tania Ade, MD;  Location: [place];  Service: Orthopedics;  Laterality: Right;   REVERSE SHOULDER REPLACEMENT Right 05/14/2018   RIGHT HEART CATH N/A 07/21/2019   Procedure: RIGHT HEART CATH;  Surgeon: Larey Dresser, MD;  Location: [place] CV LAB;  Service: Cardiovascular;  Laterality: N/A;   TEE WITH CARDIOVERSION  09/22/2017   TEE WITHOUT CARDIOVERSION N/A 10/03/2017   Procedure: TRANSESOPHAGEAL ECHOCARDIOGRAM (TEE);  Surgeon: Sanda Klein, MD;  Location: [facility] ENDOSCOPY;  Service: Cardiovascular;  Laterality: N/A;   TEE WITHOUT CARDIOVERSION N/A 08/04/2019   Procedure: TRANSESOPHAGEAL ECHOCARDIOGRAM (TEE);  Surgeon: Larey Dresser, MD;  Location: [facility] ENDOSCOPY;  Service: Cardiovascular;  Laterality: N/A;   TEE WITHOUT CARDIOVERSION N/A 12/10/2019   Procedure: TRANSESOPHAGEAL ECHOCARDIOGRAM (TEE);  Surgeon: Buford Dresser, MD;  Location: [facility] ENDOSCOPY;  Service: Cardiovascular;  Laterality:  N/A;   TEE WITHOUT CARDIOVERSION N/A 03/09/2021   Procedure: TRANSESOPHAGEAL ECHOCARDIOGRAM (TEE);  Surgeon: Pixie Casino, MD;  Location: [place];  Service: Cardiovascular;  Laterality: N/A;   THYROIDECTOMY  1998   Dr Margot Chimes   TONSILLECTOMY     TOTAL KNEE ARTHROPLASTY  04/13/2012   Procedure: TOTAL KNEE ARTHROPLASTY;  Surgeon: Rudean Haskell, MD;  Location: [place];  Service: Orthopedics;  Laterality: Right;   VIDEO BRONCHOSCOPY WITH ENDOBRONCHIAL ULTRASOUND N/A 02/07/2017   Procedure: VIDEO BRONCHOSCOPY WITH ENDOBRONCHIAL ULTRASOUND;  Surgeon: Marshell Garfinkel, MD;  Location: [place];  Service: Pulmonary;  Laterality: N/A;    There were no vitals filed for this visit.   Subjective Assessment - 08/15/21 1313     Subjective  Pt reports sleeping until 5 AM the last 2 mornings.    Pertinent History h/o hyperlipidemia, atrial fibrillation with pacemaker on Eliquis, lymphoma, confusion, subcortical hemorrhage on the left with intraventricular extension    Currently in Pain? No/denies    Pain Onset In the past 7 days                 Pt remembered to bring in workbook with writing that she completes daily for group Bible study.  Pt's handwriting is quite small and decreases in size and legibility as she fatigues.  Engaged in handwriting activity with 3 sentence paragraph with improved legibility and adherence to lines, increasing size of writing.  Therapist recommended use of lined paper, writing at table in supportive chair, and taking breaks throughout task to improve handwriting legibility.    24 piece jigsaw puzzle completed in standing with focus on standing tolerance, balance, endurance, and working memory.  Pt tolerated standing 10 mins with 2 minor LOB backwards but able to correct by stabilizing against table.  Noted increased difficulty with sequencing and organization of puzzle than expected.  Pt requiring increased cues for organization to increase success.  Only able to place  12 pieces correctly in the 10 mins, due to time constraints and PT session.                      OT Short Term Goals - 08/15/21 1315       OT SHORT TERM GOAL #1   Title Pt will verbalize understanding of 3 compensatory strategies for low vision.   requires min cues to  recall modifications   Time 4    Period Weeks    Status Partially Met    Target Date 08/15/21      OT SHORT TERM GOAL #2   Title Pt will improve hand strength and endurance to improve handwriting legiblity to 90%    Time 4    Period Weeks    Status Partially Met      OT SHORT TERM GOAL #3   Title Pt will demonstrate improved activity tolerance to complete standing acitivity for 15 mins without seated rest break.    Time 4    Period Weeks    Status Not Met   pt able to stand 7-8 mins during activity before requring seated rest break     OT SHORT TERM GOAL #4   Title Pt will improve shoulder ROM to 140* in RUE to increase functional reach in to cabinets    Time 4    Period Weeks    Status Achieved               OT Long Term Goals - 07/25/21 1022       OT LONG TERM GOAL #1   Title Pt will be independent in energy conservation strategies.    Time 8    Period Weeks    Status On-going      OT LONG TERM GOAL #2   Title Pt will be independent in modifications for low vision.    Time 8    Period Weeks    Status On-going      OT LONG TERM GOAL #3   Title Pt wil be able to complete housekeeping task incorporating reaching outside BOS without reports of dizziness.    Time 8    Period Weeks    Status On-going      OT LONG TERM GOAL #4   Title Pt will be able to complete dual task activity without cues for sequencing and with improved safety awareness.    Time 8    Period Weeks    Status On-going                   Plan - 08/15/21 1454     Clinical Impression Statement Pt demonstrates increased difficulty with increased cognitive challenge during table top and standing  activities.  Pt requires max multimodal cues for organization and sequencing during 24 piece puzzle.  Pt tolerated standing 10 mins with 2 LOB but able to correct without additional assist, only stabilizing on table.  Pt is unable to recall any strategies educated upon in regards to strategies for low vision, despite handout given and previous sessions discussed.  Once items stated, pt able to report that she does these things or has made similar modifications.  Pt was able to recall education on body mechanics and improved technique to complete ADLs and IADLs.    OT Occupational Profile and History Detailed Assessment- Review of Records and additional review of physical, cognitive, psychosocial history related to current functional performance    Occupational performance deficits (Please refer to evaluation for details): ADL's;IADL's;Leisure;Social Participation    Body Structure / Function / Physical Skills ADL;Balance;Body mechanics;Cardiopulmonary status limiting activity;Coordination;Endurance;FMC;GMC;IADL;Mobility;Pain;ROM;Proprioception;Sensation;Strength;UE functional use;Vision    Psychosocial Skills Environmental  Adaptations;Routines and Behaviors    Rehab Potential Good    Clinical Decision Making Limited treatment options, no task modification necessary    Comorbidities Affecting Occupational Performance: May have comorbidities impacting occupational performance    Modification or Assistance to Complete Evaluation  No modification of tasks or assist necessary to complete eval    OT Frequency 2x / week    OT Duration 8 weeks    OT Treatment/Interventions Self-care/ADL training;Cryotherapy;Electrical Stimulation;Ultrasound;Moist Heat;Fluidtherapy;Therapeutic exercise;Neuromuscular education;Energy conservation;Manual Therapy;Functional Mobility Training;DME and/or AE instruction;Passive range of motion;Patient/family education;Visual/perceptual remediation/compensation;Cognitive  remediation/compensation;Therapeutic activities;Balance training;Psychosocial skills training;Coping strategies training    Plan handwriting to include passage or grocery list.  Continue gross and fine motor strengtening for handwriting.  Dynamic balance/mobility while addressing visual fields and activity tolerance for IADLs    Consulted and Agree with Plan of Care Patient             Patient will benefit from skilled therapeutic intervention in order to improve the following deficits and impairments:   Body Structure / Function / Physical Skills: ADL, Balance, Body mechanics, Cardiopulmonary status limiting activity, Coordination, Endurance, FMC, GMC, IADL, Mobility, Pain, ROM, Proprioception, Sensation, Strength, UE functional use, Vision   Psychosocial Skills: Environmental  Adaptations, Routines and Behaviors   Visit Diagnosis: Unsteadiness on feet  Muscle weakness (generalized)  Other abnormalities of gait and mobility  Attention and concentration deficit  Blindness, one eye, low vision other eye, unspecified eyes    Problem List Patient Active Problem List   Diagnosis Date Noted   Hypertension    Pressure injury of skin 03/15/2021   Intracranial hemorrhage (HCC) 03/14/2021   Lower extremity edema    Polymicrobial bacterial infection    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    Enterococcus faecalis infection    Sepsis due to Escherichia coli ([place])    Debility 02/13/2021   Infection of wound hematoma 02/05/2021   Physical debility 12/22/2020   Tear of left hamstring 12/22/2020   Labral tear of left hip joint 12/22/2020   Acute blood loss anemia    Orthostatic hypotension 12/17/2020   History of CVA (cerebrovascular accident) 12/17/2020   Fall 12/15/2020   Hip hematoma, left, initial encounter 12/15/2020   Nondisplaced fracture of coronoid process of left ulna, initial encounter for closed fracture 12/15/2020   Multiple rib fractures 12/15/2020   COVID-19  virus infection 10/12/2020   Constipation 06/22/2020   Change in bowel habits 06/22/2020   Diverticulosis 06/22/2020   Dark stools 06/22/2020   Hemorrhoids 06/22/2020   Pacemaker 01/23/2020   Junctional rhythm 01/23/2020   Palpitations 01/23/2020   ICH (intracerebral hemorrhage) ([place]) 12/02/2019   A-fib ([place]) 12/02/2019   Anemia 12/02/2019   QT prolongation 12/02/2019   Hypothyroidism 12/02/2019   Gait abnormality 07/10/2018   S/P reverse total shoulder arthroplasty, 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   Persistent atrial fibrillation ([place])    Bilateral ureteral calculi 11/17/2017   Atrial fibrillation with RVR ([place]) 09/15/2017   Atrial flutter ([place]) 07/17/2017   Port catheter in place 04/02/2017   Non-Hodgkin lymphoma, unspecified, intrathoracic lymph nodes ([place]) 02/13/2017   Mediastinal mass 02/06/2017   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[place]) 02/06/2017   Abnormal chest x-ray    Lung mass 02/03/2017   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   OSA (obstructive sleep apnea) 02/03/2015   History of renal calculi 11/16/2014   Renal calculi 11/16/2014   Family history of colon cancer 10/26/2014   Mechanical complication due to cardiac pacemaker pulse generator 11/06/2013   Dyspnea 05/12/2013   Hypothyroidism 04/21/2013   Pleural effusion 04/19/2013   Long term (current) use of anticoagulants 12/20/2010   EPIDERMOID CYST 08/22/2010   Chronic diastolic heart failure ([place]) 08/06/2010   SYNCOPE AND COLLAPSE 07/27/2010   OSTEOARTHRITIS, KNEE, RIGHT 03/15/2010   Class  1 obesity due to excess calories with body mass index (BMI) of 33.0 to 33.9 in adult 12/07/2009   NONSPEC ELEVATION OF LEVELS OF TRANSAMINASE/LDH 09/08/2009   BREAST CANCER, HX OF 07/25/2009   COLONIC POLYPS, HX OF 07/25/2009   TUBULOVILLOUS ADENOMA, COLON 04/29/2008   HYPERGLYCEMIA, FASTING 04/29/2008   HYPERLIPIDEMIA 06/22/2007   CARCINOMA, THYROID GLAND, HX OF 06/22/2007    Simonne Come, OT/L 08/15/2021, 3:03 PM  [facility] [address] [place], [place], [address] Phone: [phone]   Fax:  [phone]  Name: Kayla Harrison MRN: [id number] Date of Birth: 07-31-1946

## 2021-08-20 ENCOUNTER — Ambulatory Visit: Payer: Medicare Other | Admitting: Physical Therapy

## 2021-08-20 ENCOUNTER — Ambulatory Visit: Payer: Medicare Other | Admitting: Occupational Therapy

## 2021-08-22 ENCOUNTER — Encounter: Payer: Self-pay | Admitting: Occupational Therapy

## 2021-08-22 ENCOUNTER — Ambulatory Visit: Payer: Medicare Other | Admitting: Occupational Therapy

## 2021-08-22 ENCOUNTER — Other Ambulatory Visit: Payer: Self-pay

## 2021-08-22 ENCOUNTER — Ambulatory Visit: Payer: Medicare Other | Admitting: Physical Therapy

## 2021-08-22 ENCOUNTER — Encounter: Payer: Self-pay | Admitting: Physical Therapy

## 2021-08-22 VITALS — BP 121/94 | HR 104

## 2021-08-22 DIAGNOSIS — R296 Repeated falls: Secondary | ICD-10-CM

## 2021-08-22 DIAGNOSIS — R2689 Other abnormalities of gait and mobility: Secondary | ICD-10-CM

## 2021-08-22 DIAGNOSIS — H5410 Blindness, one eye, low vision other eye, unspecified eyes: Secondary | ICD-10-CM

## 2021-08-22 DIAGNOSIS — R2681 Unsteadiness on feet: Secondary | ICD-10-CM | POA: Diagnosis not present

## 2021-08-22 DIAGNOSIS — R41840 Attention and concentration deficit: Secondary | ICD-10-CM | POA: Diagnosis not present

## 2021-08-22 DIAGNOSIS — M6281 Muscle weakness (generalized): Secondary | ICD-10-CM | POA: Diagnosis not present

## 2021-08-22 NOTE — Therapy (Signed)
[facility] [address], [place], [place], [address] Phone: [phone]   Fax:  [phone]  Physical Therapy Treatment  Patient Details  Name: Kayla Harrison MRN: [id number] Date of Birth: 02/20/1946 Referring Provider (PT): Garvin Fila, MD   Encounter Date: 08/22/2021   PT End of Session - 08/22/21 1246     Visit Number 9    Number of Visits 17    Date for PT Re-Evaluation 09/12/21    Authorization Type Medicare & MoO    PT Start Time 1102    PT Stop Time 6720    PT Time Calculation (min) 43 min    Equipment Utilized During Treatment Gait belt    Activity Tolerance Patient tolerated treatment well    Behavior During Therapy WFL for tasks assessed/performed             Past Medical History:  Diagnosis Date   Anemia    Arthritis    osteoarthritis - knees and right shoulder   Blood transfusion without reported diagnosis    Breast cancer ([place])    Dr Margot Chimes, total thyroidectomy- 1999- for cancer   Brucellosis 1964   Chronic bilateral pleural effusions    Colon polyp    Dr Earlean Shawl   Complication of anesthesia    Ketamine produces LSD reaction, bright colored nightmarish experience    Dyslipidemia    Endometriosis    Fibroids    H/O pleural effusion    s/p thoracentesis w 3252ml withdrawn   Hepatitis    [place], ?hepatitis    History of dysphagia    due to radiation therapy   History of hiatal hernia    small noted on PET scan   Hypertension    Hypothyroidism    Lung cancer, lower lobe ([place]) 01/2017   radiation RX completed 03/04/17; will start chemo 6/27, pt unaware of lung cancer   Morbid obesity ([place])    Status post lap band surgery   Nephrolithiasis    Non Hodgkin's lymphoma ([place])    on chemotherapy   Persistent atrial fibrillation ([place])    a. s/p PVI 2008 b. s/p convergent ablation 9470 complicated by bradycardia requiring pacemaker implant   Personal  history of radiation therapy    Presence of permanent cardiac pacemaker    Rotator cuff tear    Right   Stroke ([place])    2003- [place] x2   SVC syndrome    with lung mass and non hodgkins lymphoma   Thyroid cancer ([place]) 2000    Past Surgical History:  Procedure Laterality Date   ABDOMINAL HYSTERECTOMY  1983   afib ablation     a. 2008 PVI b. 2014 convergent ablation   APPENDECTOMY     BONE MARROW BIOPSY  02/21/2017   BREAST LUMPECTOMY Left 2010   [place]     2015- negative   CARDIOVERSION  10/09/2012   Procedure: CARDIOVERSION;  Surgeon: Minus Breeding, MD;  Location: [facility];  Service: Cardiovascular;  Laterality: N/A;   CARDIOVERSION  10/09/2012   Procedure: CARDIOVERSION;  Surgeon: Minus Breeding, MD;  Location: [facility] ENDOSCOPY;  Service: Cardiovascular;  Laterality: N/A;  [name] gave the ok to add pt to the add on , but we must check to find out if the can add pt on at 1400 [phone])   CARDIOVERSION N/A 11/20/2012   Procedure: CARDIOVERSION;  Surgeon: Fay Records, MD;  Location: [facility];  Service: Cardiovascular;  Laterality: N/A;   CARDIOVERSION N/A 07/18/2017   Procedure: CARDIOVERSION;  Surgeon: Acie Fredrickson Wonda Cheng, MD;  Location: [facility];  Service: Cardiovascular;  Laterality: N/A;   CARDIOVERSION N/A 10/03/2017   Procedure: CARDIOVERSION;  Surgeon: Sanda Klein, MD;  Location: [facility] ENDOSCOPY;  Service: Cardiovascular;  Laterality: N/A;   CARDIOVERSION N/A 01/07/2018   Procedure: CARDIOVERSION;  Surgeon: Thayer Headings, MD;  Location: [facility] ENDOSCOPY;  Service: Cardiovascular;  Laterality: N/A;   CARDIOVERSION N/A 12/10/2019   Procedure: CARDIOVERSION;  Surgeon: Buford Dresser, MD;  Location: [facility] ENDOSCOPY;  Service: Cardiovascular;  Laterality: N/A;   CARDIOVERSION N/A 03/09/2021   Procedure: CARDIOVERSION;  Surgeon: Pixie Casino, MD;  Location: [place];  Service: Cardiovascular;  Laterality: N/A;   CHOLECYSTECTOMY     COLONOSCOPY  W/ POLYPECTOMY     Dr Earlean Shawl   CYSTOSCOPY N/A 02/06/2015   Procedure: CYSTOSCOPY;  Surgeon: Kathie Rhodes, MD;  Location: [facility] ORS;  Service: Urology;  Laterality: N/A;   CYSTOSCOPY W/ RETROGRADES Left 11/17/2017   Procedure: CYSTOSCOPY WITH RETROGRADE /PYELOGRAM/;  Surgeon: Kathie Rhodes, MD;  Location: [facility];  Service: Urology;  Laterality: Left;   CYSTOSCOPY WITH RETROGRADE PYELOGRAM, URETEROSCOPY AND STENT PLACEMENT Right 02/06/2015   Procedure: RETROGRADE PYELOGRAM, RIGHT URETEROSCOPY STENT PLACEMENT;  Surgeon: Kathie Rhodes, MD;  Location: [facility];  Service: Urology;  Laterality: Right;   CYSTOSCOPY WITH RETROGRADE PYELOGRAM, URETEROSCOPY AND STENT PLACEMENT Right 03/07/2017   Procedure: CYSTOSCOPY WITH RIGHT RETROGRADE PYELOGRAM,RIGHT  URETEROSCOPYLASER LITHOTRIPSY  AND STENT PLACEMENT AND STONE BASKETRY;  Surgeon: Kathie Rhodes, MD;  Location: [facility];  Service: Urology;  Laterality: Right;   EYE SURGERY     cataract surgery   fatty mass removal  1999   pubic area   HOLMIUM LASER APPLICATION N/A 2/35/5732   Procedure: HOLMIUM LASER APPLICATION;  Surgeon: Kathie Rhodes, MD;  Location: [facility];  Service: Urology;  Laterality: N/A;   HOLMIUM LASER APPLICATION Right 10/25/5425   Procedure: HOLMIUM LASER APPLICATION;  Surgeon: Kathie Rhodes, MD;  Location: [facility];  Service: Urology;  Laterality: Right;   HOLMIUM LASER APPLICATION Left 0/62/3762   Procedure: HOLMIUM LASER APPLICATION;  Surgeon: Kathie Rhodes, MD;  Location: [facility];  Service: Urology;  Laterality: Left;   I & D EXTREMITY Left 12/19/2020   Procedure: IRRIGATION AND DEBRIDEMENT OF LEFT HIP HEMATOMA WITH APPLICATION OF WOUND VAC;  Surgeon: Erle Crocker, MD;  Location: [facility] [place];  Service: Orthopedics;  Laterality: Left;   I & D EXTREMITY Left 02/06/2021   Procedure: IRRIGATION AND DEBRIDEMENT DEEP ABCESS LEFT THIGH, SECONDARY CLOSURE OF WOUND DEHISCENCE;  Surgeon: Erle Crocker, MD;   Location: [place] [facility];  Service: Orthopedics;  Laterality: Left;   IR FLUORO GUIDE PORT INSERTION RIGHT  02/24/2017   IR NEPHROSTOMY PLACEMENT RIGHT  11/17/2017   IR PATIENT EVAL TECH 0-60 MINS  03/11/2017   IR REMOVAL TUN ACCESS W/ PORT W/O FL MOD SED  04/20/2018   IR US GUIDE VASC ACCESS RIGHT  02/24/2017   KNEE ARTHROSCOPY     bilateral   LAPAROSCOPIC GASTRIC BANDING  07/10/2010   LAPAROSCOPIC GASTRIC BANDING     Laparoscopic adjustable banding APS System with posterior hiatal hernia, 2 suture.   LAPAROTOMY     for ruptured ovary and ovarian artery    NEPHROLITHOTOMY Right 11/17/2017   Procedure: NEPHROLITHOTOMY PERCUTANEOUS;  Surgeon: Kathie Rhodes, MD;  Location: [facility];  Service: Urology;  Laterality: Right;  PACEMAKER INSERTION  03/10/2013   MDT dual chamber PPM   POCKET REVISION N/A 12/08/2013   Procedure: POCKET REVISION;  Surgeon: Deboraha Sprang, MD;  Location: [facility] CATH LAB;  Service: Cardiovascular;  Laterality: N/A;   PORTA CATH INSERTION     REVERSE SHOULDER ARTHROPLASTY Right 05/14/2018   Procedure: RIGHT REVERSE SHOULDER ARTHROPLASTY;  Surgeon: Tania Ade, MD;  Location: [place];  Service: Orthopedics;  Laterality: Right;   REVERSE SHOULDER REPLACEMENT Right 05/14/2018   RIGHT HEART CATH N/A 07/21/2019   Procedure: RIGHT HEART CATH;  Surgeon: Larey Dresser, MD;  Location: [place] CV LAB;  Service: Cardiovascular;  Laterality: N/A;   TEE WITH CARDIOVERSION  09/22/2017   TEE WITHOUT CARDIOVERSION N/A 10/03/2017   Procedure: TRANSESOPHAGEAL ECHOCARDIOGRAM (TEE);  Surgeon: Sanda Klein, MD;  Location: [facility] ENDOSCOPY;  Service: Cardiovascular;  Laterality: N/A;   TEE WITHOUT CARDIOVERSION N/A 08/04/2019   Procedure: TRANSESOPHAGEAL ECHOCARDIOGRAM (TEE);  Surgeon: Larey Dresser, MD;  Location: [facility] ENDOSCOPY;  Service: Cardiovascular;  Laterality: N/A;   TEE WITHOUT CARDIOVERSION N/A 12/10/2019   Procedure: TRANSESOPHAGEAL ECHOCARDIOGRAM (TEE);  Surgeon: Buford Dresser, MD;   Location: [facility] ENDOSCOPY;  Service: Cardiovascular;  Laterality: N/A;   TEE WITHOUT CARDIOVERSION N/A 03/09/2021   Procedure: TRANSESOPHAGEAL ECHOCARDIOGRAM (TEE);  Surgeon: Pixie Casino, MD;  Location: [facility];  Service: Cardiovascular;  Laterality: N/A;   THYROIDECTOMY  1998   Dr Margot Chimes   TONSILLECTOMY     TOTAL KNEE ARTHROPLASTY  04/13/2012   Procedure: TOTAL KNEE ARTHROPLASTY;  Surgeon: Rudean Haskell, MD;  Location: [place];  Service: Orthopedics;  Laterality: Right;   VIDEO BRONCHOSCOPY WITH ENDOBRONCHIAL ULTRASOUND N/A 02/07/2017   Procedure: VIDEO BRONCHOSCOPY WITH ENDOBRONCHIAL ULTRASOUND;  Surgeon: Marshell Garfinkel, MD;  Location: [place];  Service: Pulmonary;  Laterality: N/A;    Vitals:   08/22/21 1104  BP: (!) 121/94  Pulse: (!) 104     Subjective Assessment - 08/22/21 1104     Subjective Pt reports having a sore on her L leg that OT examined and recommended for pt to call her md.  Pt reports it is closed over but was an open wound.  Pt reports increased redness and heat at wound.    Pertinent History anemia, breast CA s/p surgery, lung CA s/p radiation and chemo, lymphoma on chemo, thyroid CA, HLD, hiatal hernia, HTN, a-fib, pacemaker, R reverse TSA 2019, stroke 2003, SVC syndrome, R TKA 2013    Diagnostic tests 03/15/21 head CT: Similar appearance of left caudothalamic hemorrhage with  intraventricular extension and mild adjacent edema. No new  hemorrhage. Unchanged mild trapping of the left lateral ventricle    Patient Stated Goals be able to get up off the floor    Currently in Pain? Yes    Pain Score 3     Pain Location Leg    Pain Orientation Left    Pain Descriptors / Indicators Sore;Radiating    Pain Type Acute pain    Pain Onset In the past 7 days    Pain Frequency Constant    Multiple Pain Sites No                  [facility] Adult PT Treatment/Exercise - 08/22/21 0001       Transfers   Transfers Sit to Stand;Stand to Sit    Sit to Stand 5:  Supervision      Posture/Postural Control   Posture/Postural Control Postural limitations    Postural Limitations Rounded Shoulders;Forward head;Increased thoracic  kyphosis;Flexed trunk      Knee/Hip Exercises: Aerobic   Nustep Level 5 all 4 extremities x 6 minutes with steps per minute >65      Knee/Hip Exercises: Standing   Other Standing Knee Exercises sit<>stand 5 reps x 2 sets from mat with SBA  Several bouts of posterior lean.      Knee/Hip Exercises: Seated   Long Arc Quad Both;1 set;10 reps    Marching Both;2 sets;10 reps;AROM;Limitations    Marching Limitations decreased AROM especially on 2nd set      Knee/Hip Exercises: Supine   Bridges Strengthening;Both;10 reps;Other (comment);Right;Left;3 sets   single leg also x 10 reps each   Other Supine Knee/Hip Exercises bil clam shells x 10 reps x 2 and single leg x 10 x 2-all with red theraband and cues for equal ROM    Other Supine Knee/Hip Exercises marching bil LE with red theraband x 10 x 2 sets                       PT Short Term Goals - 07/27/21 1011       PT SHORT TERM GOAL #1   Title Patient to be independent with initial HEP.    Time 3    Period Weeks    Status Achieved    Target Date 08/08/21               PT Long Term Goals - 07/25/21 1224       PT LONG TERM GOAL #1   Title Patient to be independent with advanced HEP.    Time 8    Period Weeks    Status On-going      PT LONG TERM GOAL #2   Title Patient to demonstrate B LE strength >/=4+/5.    Time 8    Period Weeks    Status On-going      PT LONG TERM GOAL #3   Title Patient to score at least 46/56 on [place] in order to decrease risk of falls.    Time 8    Period Weeks    Status On-going      PT LONG TERM GOAL #4   Title Patient to complete TUG in <14 sec with LRAD in order to decrease risk of falls.    Time 8    Period Weeks    Status On-going      PT LONG TERM GOAL #5   Title Patient to demonstrate 5xSTS test in <15 sec  in order to decrease risk of falls.    Time 8    Period Weeks    Status On-going      PT LONG TERM GOAL #6   Title Patient to demonstrate safe floor transfer with mod I.    Time 8    Period Weeks    Status On-going                   Plan - 08/22/21 1247     Clinical Impression Statement Pt reports improved endurance and tolerance and was able to tolerate increased activity in clinic as well today.  Pt needs cues for technique with exercises and to slow down at times for controlled movements.  Continues to need rest breaks during session.  Cont per poc.    Personal Factors and Comorbidities Age;Fitness;Comorbidity 3+;Transportation;Time since onset of injury/illness/exacerbation;Past/Current Experience    Comorbidities anemia, breast CA s/p surgery, lung CA s/p radiation and chemo, lymphoma on chemo,  thyroid CA, HLD, hiatal hernia, HTN, a-fib, pacemaker, R reverse TSA 2019, stroke 2003, SVC syndrome, R TKA 2013    Examination-Activity Limitations Bathing;Locomotion Level;Transfers;Bed Mobility;Reach Overhead;Bend;Carry;Squat;Dressing;Stairs;Stand;Hygiene/Grooming;Lift;Toileting    Examination-Participation Restrictions Laundry;Shop;Community Activity;Cleaning;Church;Meal Prep    Stability/Clinical Decision Making Stable/Uncomplicated    Rehab Potential Good    PT Frequency 2x / week    PT Duration 8 weeks    PT Treatment/Interventions ADLs/Self Care Home Management;Cryotherapy;Electrical Stimulation;DME Instruction;Moist Heat;Gait training;Stair training;Functional mobility training;Therapeutic activities;Therapeutic exercise;Balance training;Neuromuscular re-education;Manual techniques;Patient/family education;Passive range of motion;Dry needling;Energy conservation;Vestibular;Vasopneumatic Device;Taping    PT Next Visit Plan check HEP and revise if needed (unsure if pt is currently performing at home), balance training with SLS activities; progress LE stretching and strengthening  to improve posture and gait deviations; continue gait training with SPC    Consulted and Agree with Plan of Care Patient             Patient will benefit from skilled therapeutic intervention in order to improve the following deficits and impairments:  Abnormal gait, Decreased range of motion, Difficulty walking, Increased fascial restricitons, Increased muscle spasms, Decreased safety awareness, Decreased endurance, Cardiopulmonary status limiting activity, Decreased activity tolerance, Decreased balance, Impaired flexibility, Improper body mechanics, Postural dysfunction, Decreased strength  Visit Diagnosis: Unsteadiness on feet  Other abnormalities of gait and mobility  Muscle weakness (generalized)     Problem List Patient Active Problem List   Diagnosis Date Noted   Hypertension    Pressure injury of skin 03/15/2021   Intracranial hemorrhage ([place]) 03/14/2021   Lower extremity edema    Polymicrobial bacterial infection    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    Enterococcus faecalis infection    Sepsis due to Escherichia coli ([place])    Debility 02/13/2021   Infection of wound hematoma 02/05/2021   Physical debility 12/22/2020   Tear of left hamstring 12/22/2020   Labral tear of left hip joint 12/22/2020   Acute blood loss anemia    Orthostatic hypotension 12/17/2020   History of CVA (cerebrovascular accident) 12/17/2020   Fall 12/15/2020   Hip hematoma, left, initial encounter 12/15/2020   Nondisplaced fracture of coronoid process of left ulna, initial encounter for closed fracture 12/15/2020   Multiple rib fractures 12/15/2020   COVID-19 virus infection 10/12/2020   Constipation 06/22/2020   Change in bowel habits 06/22/2020   Diverticulosis 06/22/2020   Dark stools 06/22/2020   Hemorrhoids 06/22/2020   Pacemaker 01/23/2020   Junctional rhythm 01/23/2020   Palpitations 01/23/2020   ICH (intracerebral hemorrhage) ([place]) 12/02/2019   A-fib ([place]) 12/02/2019    Anemia 12/02/2019   QT prolongation 12/02/2019   Hypothyroidism 12/02/2019   Gait abnormality 07/10/2018   S/P reverse total shoulder arthroplasty, 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   Persistent atrial fibrillation ([place])    Bilateral ureteral calculi 11/17/2017   Atrial fibrillation with RVR ([place]) 09/15/2017   Atrial flutter ([place]) 07/17/2017   Port catheter in place 04/02/2017   Non-Hodgkin lymphoma, unspecified, intrathoracic lymph nodes ([place]) 02/13/2017   Mediastinal mass 02/06/2017   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   Abnormal chest x-ray    Lung mass 02/03/2017   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   OSA (obstructive sleep apnea) 02/03/2015   History of renal calculi 11/16/2014   Renal calculi 11/16/2014   Family history of colon cancer 10/26/2014   Mechanical complication due to cardiac pacemaker pulse generator 11/06/2013   Dyspnea 05/12/2013   Hypothyroidism 04/21/2013   Pleural effusion 04/19/2013   Long term (current) use of anticoagulants 12/20/2010  EPIDERMOID CYST 08/22/2010   Chronic diastolic heart failure ([place]) 08/06/2010   SYNCOPE AND COLLAPSE 07/27/2010   OSTEOARTHRITIS, KNEE, RIGHT 03/15/2010   Class 1 obesity due to excess calories with body mass index (BMI) of 33.0 to 33.9 in adult 12/07/2009   NONSPEC ELEVATION OF LEVELS OF TRANSAMINASE/LDH 09/08/2009   BREAST CANCER, HX OF 07/25/2009   COLONIC POLYPS, HX OF 07/25/2009   TUBULOVILLOUS ADENOMA, COLON 04/29/2008   HYPERGLYCEMIA, FASTING 04/29/2008   HYPERLIPIDEMIA 06/22/2007   CARCINOMA, THYROID GLAND, HX OF 06/22/2007   Narda Bonds, PTA [place] 08/22/21 12:51 PM Phone: [phone] Fax: [facility] [address], [place], [place], [address] Phone: [phone]   Fax:  [phone]  Name: Kayla Harrison MRN: [id number] Date of Birth: August 04, 1946

## 2021-08-22 NOTE — Therapy (Signed)
[facility] [address], [address] [place], [place], [address] Phone: [phone]   Fax:  [phone]  Occupational Therapy Treatment  Patient Details  Name: Kayla Harrison MRN: [id number] Date of Birth: [birth year] Referring Provider (OT): Pearlean Brownie   Encounter Date: 08/22/2021   OT End of Session - 08/22/21 1031     Visit Number 9    Number of Visits 17    Date for OT Re-Evaluation 09/12/21    Authorization Type Medicare A and B    OT Start Time 1019    OT Stop Time 1102    OT Time Calculation (min) 43 min    Activity Tolerance Patient tolerated treatment well    Behavior During Therapy WFL for tasks assessed/performed             Past Medical History:  Diagnosis Date   Anemia    Arthritis    osteoarthritis - knees and right shoulder   Blood transfusion without reported diagnosis    Breast cancer (HCC)    Dr Jamey Ripa, total thyroidectomy- 1999- for cancer   Brucellosis 1964   Chronic bilateral pleural effusions    Colon polyp    Dr Kinnie Scales   Complication of anesthesia    Ketamine produces LSD reaction, bright colored nightmarish experience    Dyslipidemia    Endometriosis    Fibroids    H/O pleural effusion    s/p thoracentesis w withdrawn   Hepatitis    [place] ?hepatitis    History of dysphagia    due to radiation therapy   History of hiatal hernia    small noted on PET scan   Hypertension    Hypothyroidism    Lung cancer, lower lobe (HCC) 01/2017   radiation RX completed 03/04/17; will start chemo 6/27, pt unaware of lung cancer   Morbid obesity (HCC)    Status post lap band surgery   Nephrolithiasis    Non Hodgkin's lymphoma (HCC)    on chemotherapy   Persistent atrial fibrillation (HCC)    a. s/p PVI 2008 b. s/p convergent ablation 2014 complicated by bradycardia requiring pacemaker implant   Personal history of radiation therapy    Presence of permanent cardiac  pacemaker    Rotator cuff tear    Right   Stroke (HCC)    2003- [place] x2   SVC syndrome    with lung mass and non hodgkins lymphoma   Thyroid cancer (HCC) 2000    Past Surgical History:  Procedure Laterality Date   ABDOMINAL HYSTERECTOMY  1983   afib ablation     a. 2008 PVI b. 2014 convergent ablation   APPENDECTOMY     BONE MARROW BIOPSY  02/21/2017   BREAST LUMPECTOMY Left 2010   bso  1998   CARDIAC CATHETERIZATION     2015- negative   CARDIOVERSION  10/09/2012   Procedure: CARDIOVERSION;  Surgeon: Rollene Rotunda, MD;  Location: [facility] OR;  Service: Cardiovascular;  Laterality: N/A;   CARDIOVERSION  10/09/2012   Procedure: CARDIOVERSION;  Surgeon: Rollene Rotunda, MD;  Location: [facility] ENDOSCOPY;  Service: Cardiovascular;  Laterality: N/A;  Kathlene November gave the ok to add pt to the add on , but we must check to find out if the can add pt on at 1400 [phone]   CARDIOVERSION N/A 11/20/2012   Procedure: CARDIOVERSION;  Surgeon: Pricilla Riffle, MD;  Location: [facility] ENDOSCOPY;  Service: Cardiovascular;  Laterality: N/A;  CARDIOVERSION N/A 07/18/2017   Procedure: CARDIOVERSION;  Surgeon: Acie Fredrickson, Wonda Cheng, MD;  Location: [place];  Service: Cardiovascular;  Laterality: N/A;   CARDIOVERSION N/A 10/03/2017   Procedure: CARDIOVERSION;  Surgeon: Sanda Klein, MD;  Location: [facility] ENDOSCOPY;  Service: Cardiovascular;  Laterality: N/A;   CARDIOVERSION N/A 01/07/2018   Procedure: CARDIOVERSION;  Surgeon: Acie Fredrickson Wonda Cheng, MD;  Location: [facility] ENDOSCOPY;  Service: Cardiovascular;  Laterality: N/A;   CARDIOVERSION N/A 12/10/2019   Procedure: CARDIOVERSION;  Surgeon: Buford Dresser, MD;  Location: [facility] ENDOSCOPY;  Service: Cardiovascular;  Laterality: N/A;   CARDIOVERSION N/A 03/09/2021   Procedure: CARDIOVERSION;  Surgeon: Pixie Casino, MD;  Location: [place];  Service: Cardiovascular;  Laterality: N/A;   CHOLECYSTECTOMY     COLONOSCOPY W/ POLYPECTOMY     Dr Earlean Shawl   CYSTOSCOPY N/A 02/06/2015    Procedure: CYSTOSCOPY;  Surgeon: Kathie Rhodes, MD;  Location: [facility] ORS;  Service: Urology;  Laterality: N/A;   CYSTOSCOPY W/ RETROGRADES Left 11/17/2017   Procedure: CYSTOSCOPY WITH RETROGRADE /PYELOGRAM/;  Surgeon: Kathie Rhodes, MD;  Location: [facility] ORS;  Service: Urology;  Laterality: Left;   CYSTOSCOPY WITH RETROGRADE PYELOGRAM, URETEROSCOPY AND STENT PLACEMENT Right 02/06/2015   Procedure: RETROGRADE PYELOGRAM, RIGHT URETEROSCOPY STENT PLACEMENT;  Surgeon: Kathie Rhodes, MD;  Location: [facility] ORS;  Service: Urology;  Laterality: Right;   CYSTOSCOPY WITH RETROGRADE PYELOGRAM, URETEROSCOPY AND STENT PLACEMENT Right 03/07/2017   Procedure: CYSTOSCOPY WITH RIGHT RETROGRADE PYELOGRAM,RIGHT  URETEROSCOPYLASER LITHOTRIPSY  AND STENT PLACEMENT AND STONE BASKETRY;  Surgeon: Kathie Rhodes, MD;  Location: [place];  Service: Urology;  Laterality: Right;   EYE SURGERY     cataract surgery   fatty mass removal  1999   pubic area   HOLMIUM LASER APPLICATION N/A 7/89/3810   Procedure: HOLMIUM LASER APPLICATION;  Surgeon: Kathie Rhodes, MD;  Location: [facility] ORS;  Service: Urology;  Laterality: N/A;   HOLMIUM LASER APPLICATION Right 1/75/1025   Procedure: HOLMIUM LASER APPLICATION;  Surgeon: Kathie Rhodes, MD;  Location: [facility];  Service: Urology;  Laterality: Right;   HOLMIUM LASER APPLICATION Left 8/52/7782   Procedure: HOLMIUM LASER APPLICATION;  Surgeon: Kathie Rhodes, MD;  Location: [facility] ORS;  Service: Urology;  Laterality: Left;   I & D EXTREMITY Left 12/19/2020   Procedure: IRRIGATION AND DEBRIDEMENT OF LEFT HIP HEMATOMA WITH APPLICATION OF WOUND VAC;  Surgeon: Erle Crocker, MD;  Location: [place];  Service: Orthopedics;  Laterality: Left;   I & D EXTREMITY Left 02/06/2021   Procedure: IRRIGATION AND DEBRIDEMENT DEEP ABCESS LEFT THIGH, SECONDARY CLOSURE OF WOUND DEHISCENCE;  Surgeon: Erle Crocker, MD;  Location: [place];  Service: Orthopedics;  Laterality: Left;   IR  FLUORO GUIDE PORT INSERTION RIGHT  02/24/2017   IR NEPHROSTOMY PLACEMENT RIGHT  11/17/2017   IR PATIENT EVAL TECH 0-60 MINS  03/11/2017   IR REMOVAL TUN ACCESS W/ PORT W/O FL MOD SED  04/20/2018   IR US GUIDE VASC ACCESS RIGHT  02/24/2017   KNEE ARTHROSCOPY     bilateral   LAPAROSCOPIC GASTRIC BANDING  07/10/2010   LAPAROSCOPIC GASTRIC BANDING     Laparoscopic adjustable banding APS System with posterior hiatal hernia, 2 suture.   LAPAROTOMY     for ruptured ovary and ovarian artery    NEPHROLITHOTOMY Right 11/17/2017   Procedure: NEPHROLITHOTOMY PERCUTANEOUS;  Surgeon: Kathie Rhodes, MD;  Location: [facility] ORS;  Service: Urology;  Laterality: Right;   PACEMAKER INSERTION  03/10/2013   MDT dual chamber PPM  POCKET REVISION N/A 12/08/2013   Procedure: POCKET REVISION;  Surgeon: Deboraha Sprang, MD;  Location: [facility] CATH LAB;  Service: Cardiovascular;  Laterality: N/A;   PORTA CATH INSERTION     REVERSE SHOULDER ARTHROPLASTY Right 05/14/2018   Procedure: RIGHT REVERSE SHOULDER ARTHROPLASTY;  Surgeon: Tania Ade, MD;  Location: [place];  Service: Orthopedics;  Laterality: Right;   REVERSE SHOULDER REPLACEMENT Right 05/14/2018   RIGHT HEART CATH N/A 07/21/2019   Procedure: RIGHT HEART CATH;  Surgeon: Larey Dresser, MD;  Location: [place] CV LAB;  Service: Cardiovascular;  Laterality: N/A;   TEE WITH CARDIOVERSION  09/22/2017   TEE WITHOUT CARDIOVERSION N/A 10/03/2017   Procedure: TRANSESOPHAGEAL ECHOCARDIOGRAM (TEE);  Surgeon: Sanda Klein, MD;  Location: [facility] ENDOSCOPY;  Service: Cardiovascular;  Laterality: N/A;   TEE WITHOUT CARDIOVERSION N/A 08/04/2019   Procedure: TRANSESOPHAGEAL ECHOCARDIOGRAM (TEE);  Surgeon: Larey Dresser, MD;  Location: [facility] ENDOSCOPY;  Service: Cardiovascular;  Laterality: N/A;   TEE WITHOUT CARDIOVERSION N/A 12/10/2019   Procedure: TRANSESOPHAGEAL ECHOCARDIOGRAM (TEE);  Surgeon: Buford Dresser, MD;  Location: [facility] ENDOSCOPY;  Service: Cardiovascular;  Laterality:  N/A;   TEE WITHOUT CARDIOVERSION N/A 03/09/2021   Procedure: TRANSESOPHAGEAL ECHOCARDIOGRAM (TEE);  Surgeon: Pixie Casino, MD;  Location: [place];  Service: Cardiovascular;  Laterality: N/A;   THYROIDECTOMY  1998   Dr Margot Chimes   TONSILLECTOMY     TOTAL KNEE ARTHROPLASTY  04/13/2012   Procedure: TOTAL KNEE ARTHROPLASTY;  Surgeon: Rudean Haskell, MD;  Location: [place];  Service: Orthopedics;  Laterality: Right;   VIDEO BRONCHOSCOPY WITH ENDOBRONCHIAL ULTRASOUND N/A 02/07/2017   Procedure: VIDEO BRONCHOSCOPY WITH ENDOBRONCHIAL ULTRASOUND;  Surgeon: Marshell Garfinkel, MD;  Location: [place];  Service: Pulmonary;  Laterality: N/A;    There were no vitals filed for this visit.   Subjective Assessment - 08/22/21 1026     Subjective  Pt reports feeling so much better from "this virus" that has lasted ~3 weeks.  Pt reports sleeping better and walking better.    Pertinent History h/o hyperlipidemia, atrial fibrillation with pacemaker on Eliquis, lymphoma, confusion, subcortical hemorrhage on the left with intraventricular extension    Currently in Pain? Yes    Pain Score 3     Pain Location Leg    Pain Orientation Left    Pain Descriptors / Indicators Sore;Radiating    Pain Type Acute pain    Pain Onset In the past 7 days    Pain Frequency Constant               Sit > stand 2 x10 with 1.5 kg ball with focus on trunk control for sit > stand while incorporating reaching up towards ceiling with weighted ball for BUE strengthening and endurance as needed to complete IADLs.  Pt demonstrating increased difficulty initially with sit > stand during 2nd set requiring multiple attempts to fully come up to standing.  [facility] with small peg board pattern replication with use of RUE.  Pt requiring min cues for use of legend to follow pattern question cues to identify and solve errors.  Pt completed task in standing to challenge standing tolerance/endurance.  Pt stood 13 mins before requiring seated rest  break.    Completed 12 piece puzzle with picture and then 2nd 12 piece puzzle without picture with improved focus, attention, and sequencing as compared to 24 piece puzzle from previous session.  Pt continues to require min cues to for problem solving and identification of errors.  OT Short Term Goals - 08/15/21 1315       OT SHORT TERM GOAL #1   Title Pt will verbalize understanding of 3 compensatory strategies for low vision.   requires min cues to recall modifications   Time 4    Period Weeks    Status Partially Met    Target Date 08/15/21      OT SHORT TERM GOAL #2   Title Pt will improve hand strength and endurance to improve handwriting legiblity to 90%    Time 4    Period Weeks    Status Partially Met      OT SHORT TERM GOAL #3   Title Pt will demonstrate improved activity tolerance to complete standing acitivity for 15 mins without seated rest break.    Time 4    Period Weeks    Status Not Met   pt able to stand 7-8 mins during activity before requring seated rest break     OT SHORT TERM GOAL #4   Title Pt will improve shoulder ROM to 140* in RUE to increase functional reach in to cabinets    Time 4    Period Weeks    Status Achieved               OT Long Term Goals - 07/25/21 1022       OT LONG TERM GOAL #1   Title Pt will be independent in energy conservation strategies.    Time 8    Period Weeks    Status On-going      OT LONG TERM GOAL #2   Title Pt will be independent in modifications for low vision.    Time 8    Period Weeks    Status On-going      OT LONG TERM GOAL #3   Title Pt wil be able to complete housekeeping task incorporating reaching outside BOS without reports of dizziness.    Time 8    Period Weeks    Status On-going      OT LONG TERM GOAL #4   Title Pt will be able to complete dual task activity without cues for sequencing and with improved safety awareness.    Time 8    Period Weeks     Status On-going                   Plan - 08/22/21 1036     Clinical Impression Statement Pt demonstrated much improved balance and endurance this session.  Pt able to tolerate standing up to 13 mins during table top task this session.  Pt continues to demonstrate decreased dynamic balance and trunk control with sit > stand without use of hands as she fatigues.  Pt requiring only min cues for sequencing and problem solving during peg board pattern replication and 12 piece puzzle activities, compared to max during previous session.    OT Occupational Profile and History Detailed Assessment- Review of Records and additional review of physical, cognitive, psychosocial history related to current functional performance    Occupational performance deficits (Please refer to evaluation for details): ADL's;IADL's;Leisure;Social Participation    Body Structure / Function / Physical Skills ADL;Balance;Body mechanics;Cardiopulmonary status limiting activity;Coordination;Endurance;FMC;GMC;IADL;Mobility;Pain;ROM;Proprioception;Sensation;Strength;UE functional use;Vision    Psychosocial Skills Environmental  Adaptations;Routines and Behaviors    Rehab Potential Good    Clinical Decision Making Limited treatment options, no task modification necessary    Comorbidities Affecting Occupational Performance: May have comorbidities impacting occupational performance    Modification or  Assistance to Complete Evaluation  No modification of tasks or assist necessary to complete eval    OT Frequency 2x / week    OT Duration 8 weeks    OT Treatment/Interventions Self-care/ADL training;Cryotherapy;Electrical Stimulation;Ultrasound;Moist Heat;Fluidtherapy;Therapeutic exercise;Neuromuscular education;Energy conservation;Manual Therapy;Functional Mobility Training;DME and/or AE instruction;Passive range of motion;Patient/family education;Visual/perceptual remediation/compensation;Cognitive  remediation/compensation;Therapeutic activities;Balance training;Psychosocial skills training;Coping strategies training    Plan handwriting to include passage or grocery list.  Continue gross and fine motor strengtening for handwriting.  Dynamic balance/mobility while addressing visual fields and activity tolerance for IADLs    Consulted and Agree with Plan of Care Patient             Patient will benefit from skilled therapeutic intervention in order to improve the following deficits and impairments:   Body Structure / Function / Physical Skills: ADL, Balance, Body mechanics, Cardiopulmonary status limiting activity, Coordination, Endurance, FMC, GMC, IADL, Mobility, Pain, ROM, Proprioception, Sensation, Strength, UE functional use, Vision   Psychosocial Skills: Environmental  Adaptations, Routines and Behaviors   Visit Diagnosis: Unsteadiness on feet  Muscle weakness (generalized)  Other abnormalities of gait and mobility  Attention and concentration deficit  Blindness, one eye, low vision other eye, unspecified eyes  Repeated falls    Problem List Patient Active Problem List   Diagnosis Date Noted   Hypertension    Pressure injury of skin 03/15/2021   Intracranial hemorrhage ([place]) 03/14/2021   Lower extremity edema    Polymicrobial bacterial infection    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    Enterococcus faecalis infection    Sepsis due to Escherichia coli ([place])    Debility 02/13/2021   Infection of wound hematoma 02/05/2021   Physical debility 12/22/2020   Tear of left hamstring 12/22/2020   Labral tear of left hip joint 12/22/2020   Acute blood loss anemia    Orthostatic hypotension 12/17/2020   History of CVA (cerebrovascular accident) 12/17/2020   Fall 12/15/2020   Hip hematoma, left, initial encounter 12/15/2020   Nondisplaced fracture of coronoid process of left ulna, initial encounter for closed fracture 12/15/2020   Multiple rib fractures  12/15/2020   COVID-19 virus infection 10/12/2020   Constipation 06/22/2020   Change in bowel habits 06/22/2020   Diverticulosis 06/22/2020   Dark stools 06/22/2020   Hemorrhoids 06/22/2020   Pacemaker 01/23/2020   Junctional rhythm 01/23/2020   Palpitations 01/23/2020   ICH (intracerebral hemorrhage) ([place]) 12/02/2019   A-fib ([place]) 12/02/2019   Anemia 12/02/2019   QT prolongation 12/02/2019   Hypothyroidism 12/02/2019   Gait abnormality 07/10/2018   S/P reverse total shoulder arthroplasty, 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   Persistent atrial fibrillation ([place])    Bilateral ureteral calculi 11/17/2017   Atrial fibrillation with RVR ([place]) 09/15/2017   Atrial flutter ([place]) 07/17/2017   Port catheter in place 04/02/2017   Non-Hodgkin lymphoma, unspecified, intrathoracic lymph nodes ([place]) 02/13/2017   Mediastinal mass 02/06/2017   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   Abnormal chest x-ray    Lung mass 02/03/2017   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   OSA (obstructive sleep apnea) 02/03/2015   History of renal calculi 11/16/2014   Renal calculi 11/16/2014   Family history of colon cancer 10/26/2014   Mechanical complication due to cardiac pacemaker pulse generator 11/06/2013   Dyspnea 05/12/2013   Hypothyroidism 04/21/2013   Pleural effusion 04/19/2013   Long term (current) use of anticoagulants 12/20/2010   EPIDERMOID CYST 08/22/2010   Chronic diastolic heart failure ([place]) 08/06/2010   SYNCOPE AND COLLAPSE 07/27/2010  OSTEOARTHRITIS, KNEE, RIGHT 03/15/2010   Class 1 obesity due to excess calories with body mass index (BMI) of 33.0 to 33.9 in adult 12/07/2009   NONSPEC ELEVATION OF LEVELS OF TRANSAMINASE/LDH 09/08/2009   BREAST CANCER, HX OF 07/25/2009   COLONIC POLYPS, HX OF 07/25/2009   TUBULOVILLOUS ADENOMA, COLON 04/29/2008   HYPERGLYCEMIA, FASTING 04/29/2008   HYPERLIPIDEMIA 06/22/2007   CARCINOMA, THYROID GLAND, HX OF 06/22/2007    Simonne Come, OT/L 08/22/2021,  11:32 AM  [facility] [address] [place], [place], [address] Phone: [phone]   Fax:  [phone]  Name: NASHIYA DISBROW MRN: [id number] Date of Birth: 04-Sep-1946

## 2021-08-23 ENCOUNTER — Other Ambulatory Visit: Payer: Self-pay | Admitting: Neurology

## 2021-08-23 DIAGNOSIS — R413 Other amnesia: Secondary | ICD-10-CM

## 2021-08-23 NOTE — Progress Notes (Signed)
 Patient Care Team: South, Stephen, MD as PCP - General (Endocrinology) Klein, Steven C, MD as PCP - Cardiology (Cardiology) Klein, Steven C, MD as PCP - Electrophysiology (Cardiology) South, Stephen, MD (Endocrinology)  DIAGNOSIS:    ICD-10-CM   1. 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)  C85.22       SUMMARY OF ONCOLOGIC HISTORY: Oncology History  Non-Hodgkin lymphoma, unspecified, intrathoracic lymph nodes (HCC)  02/03/2017 Imaging   Large superior mediastinal mass contiguous with right hilar region encasing the trachea and right mainstem bronchus measuring 5.4 x 7.5 x 6 cm causing SVC obstruction, additional mediastinal lymph nodes subcarinal 1.6 cm and left hilar 1 cm; multiple small pulmonary nodules 4-6 mm    02/11/2017 Initial Diagnosis   Flow cytometry of mediastinal mass revealed monoclonal population of B cells with expression of CD10 consistent with non-Hodgkin B-cell lymphoma germinal center operation; bone marrow biopsy negative for lymphoma   04/02/2017 - 07/16/2017 Chemotherapy   R-CHOP X 6 cycles      CHIEF COMPLIANT: Follow-up of lymphoma  INTERVAL HISTORY: Kayla Harrison is a 75 y.o. with above-mentioned history of lymphoma treated with 6 cycles of R-CHOP. She presents to the clinic today for follow-up.  She had fallen at her home and had spent next 5 months in hospitals and nursing homes.  She is finally recovered from all of that.  She is able to get around with help of walker.  She even had a hemorrhagic stroke.  ALLERGIES:  is allergic to ranolazine, rivaroxaban, tape, tikosyn [dofetilide], epinephrine, and ketamine.  MEDICATIONS:  Current Outpatient Medications  Medication Sig Dispense Refill   acetaminophen (TYLENOL) 325 MG tablet Take 2 tablets (650 mg total) by mouth every 4 (four) hours as needed for mild pain (or temp > 37.5 C (99.5 F)).     amiodarone (PACERONE) 200 MG tablet Take 1 tablet (200 mg total) by  mouth in the morning and at bedtime.     aspirin EC 81 MG EC tablet Take 1 tablet (81 mg total) by mouth daily. Swallow whole. 30 tablet 11   levothyroxine (SYNTHROID) 137 MCG tablet Take 68.5-137 mcg by mouth See admin instructions. Taking 137 mcg tablet daily except on Saturday taking 1/2 tablet     levothyroxine (SYNTHROID) 150 MCG tablet Take 150 mcg by mouth every morning.     midodrine (PROAMATINE) 5 MG tablet Take 1 tablet (5 mg total) by mouth 2 (two) times daily with breakfast and lunch.     pramoxine (SARNA SENSITIVE) 1 % LOTN Apply 1 application topically 2 (two) times daily. 118 mL 0   rosuvastatin (CRESTOR) 10 MG tablet Take 1 tablet (10 mg total) by mouth daily. Mondays & Thursdays     senna-docusate (SENOKOT-S) 8.6-50 MG tablet Take 2 tablets by mouth 2 (two) times daily.     No current facility-administered medications for this visit.    PHYSICAL EXAMINATION: ECOG PERFORMANCE STATUS: 1 - Symptomatic but completely ambulatory  Vitals:   08/24/21 1057  BP: 129/87  Pulse: (!) 104  Resp: 18  Temp: 97.7 F (36.5 C)  SpO2: 98%   Filed Weights   08/24/21 1057  Weight: 188 lb 11.2 oz (85.6 kg)    LABORATORY DATA:  I have reviewed the data as listed CMP Latest Ref Rng & Units 03/26/2021 03/20/2021 03/19/2021  Glucose 70 - 99 mg/dL 107(H) 101(H) 92  BUN 8 - 23 mg/dL 17 17 15  Creatinine 0.44 - 1.00 mg/dL 0.89 0.89   0.80  Sodium 135 - 145 mmol/L 133(L) 135 132(L)  Potassium 3.5 - 5.1 mmol/L 4.8 3.8 4.1  Chloride 98 - 111 mmol/L 103 99 99  CO2 22 - 32 mmol/L 24 22 24  Calcium 8.9 - 10.3 mg/dL 8.4(L) 8.8(L) 8.7(L)  Total Protein 6.5 - 8.1 g/dL - 5.1(L) -  Total Bilirubin 0.3 - 1.2 mg/dL - 0.8 -  Alkaline Phos 38 - 126 U/L - 78 -  AST 15 - 41 U/L - 52(H) -  ALT 0 - 44 U/L - 36 -    Lab Results  Component Value Date   WBC 5.8 08/24/2021   HGB 11.3 (L) 08/24/2021   HCT 36.5 08/24/2021   MCV 86.7 08/24/2021   PLT 211 08/24/2021   NEUTROABS 4.6 08/24/2021     ASSESSMENT & PLAN:  Non-Hodgkin lymphoma, unspecified, intrathoracic lymph nodes (HCC) 02/03/2017: Large superior mediastinal mass contiguous with right hilar region encasing the trachea and right mainstem bronchus measuring 5.4 x 7.5 x 6 cm causing SVC obstruction, additional mediastinal lymph nodes subcarinal 1.6 cm and left hilar 1 cm; multiple small pulmonary nodules 4-6 mm    Bronchoscopy and biopsy 02/09/2017: Monoclonal B-cell population expressing CD10 consistent with non-Hodgkin's B-cell lymphoma germinal center origin Bone marrow biopsy 02/21/2017: No evidence of lymphoma PET/CT scan 02/19/2017: Hypermetabolic right paratracheal nodal mass   R CHOP x6 cycles completed 07/16/2017 ---------------------------------------------------------------------- Atrial flutter: Cardioversion by Dr. Nahser 07/18/2017 PET/CT scan 08/27/2017:Hypermetabolic right paratracheal nodal mass February 2019: Renal stone status post nephrostomy   CT chest abdomen pelvis 02/17/2018: New patchy consolidation right lower lobe infection/inflammation, postradiation changes in the mediastinum.  No evidence of lymphoma recurrence   Lung infiltrates:  CT CAP 08/21/2020:Stable exam, small lung nodule stable, splenic aneurysm stable Remote improvement in her level since April 2021. Chemo-induced peripheral neuropathy: Continues to progress slowly get worse.    Fall: spent 5 months in hospitals and finally Is at home   There is no evidence of recurrence of lymphoma. I discussed with her based upon the new guidelines she does not need annual CT scans anymore.  Since it has been 5 years from her diagnosis, she can return to clinic on an as-needed basis    No orders of the defined types were placed in this encounter.  The patient has a good understanding of the overall plan. she agrees with it. she will call with any problems that may develop before the next visit here.  Total time spent: 20 mins including  face to face time and time spent for planning, charting and coordination of care   K , MD, MPH 08/24/2021  I, Kirstyn Evans, am acting as scribe for Dr.  .  I have reviewed the above documentation for accuracy and completeness, and I agree with the above.       

## 2021-08-23 NOTE — Assessment & Plan Note (Signed)
02/03/2017: Large superior mediastinal mass contiguous with right hilar region encasing the trachea and right mainstem bronchus measuring 5.4 x 7.5 x 6 cm causing SVC obstruction, additional mediastinal lymph nodes subcarinal 1.6 cm and left hilar 1 cm; multiple small pulmonary nodules 4-6 mm   Bronchoscopy and biopsy 02/09/2017: Monoclonal B-cell population expressing CD10 consistent with non-Hodgkin's B-cell lymphoma germinal center origin Bone marrow biopsy 02/21/2017: No evidence of lymphoma PET/CT scan 45/40/9811: Hypermetabolic right paratracheal nodal mass  R CHOP x6 cycles completed 07/16/2017 ---------------------------------------------------------------------- Atrial flutter: Cardioversion by Dr. Acie Fredrickson 07/18/2017 PET/CT scan 08/27/2017:Hypermetabolic right paratracheal nodal mass February 2019: Renal stone status post nephrostomy  CT chest abdomen pelvis 02/17/2018: New patchy consolidation right lower lobe infection/inflammation, postradiation changes in the mediastinum. No evidence of lymphoma recurrence  Lung infiltrates: CT CAP11/29/2021:Stable exam, small lung nodule stable, splenic aneurysm stable Remote improvement in her level since April 2021. Chemo-induced peripheral neuropathy: Continues to progress slowly get worse.  I discussed with her about using CBD oil.  There is no evidence of recurrence of lymphoma. I discussed with her based upon the new guidelines she does not need annual CT scans anymore. Return to clinic in 1 year with labs and follow-up.

## 2021-08-24 ENCOUNTER — Other Ambulatory Visit: Payer: Self-pay

## 2021-08-24 ENCOUNTER — Inpatient Hospital Stay: Payer: Medicare Other

## 2021-08-24 ENCOUNTER — Inpatient Hospital Stay: Payer: Medicare Other | Attending: Hematology and Oncology | Admitting: Hematology and Oncology

## 2021-08-24 DIAGNOSIS — Z936 Other artificial openings of urinary tract status: Secondary | ICD-10-CM | POA: Diagnosis not present

## 2021-08-24 DIAGNOSIS — G620 Drug-induced polyneuropathy: Secondary | ICD-10-CM | POA: Insufficient documentation

## 2021-08-24 DIAGNOSIS — R918 Other nonspecific abnormal finding of lung field: Secondary | ICD-10-CM | POA: Insufficient documentation

## 2021-08-24 DIAGNOSIS — C8522 Mediastinal (thymic) large B-cell lymphoma, intrathoracic lymph nodes: Secondary | ICD-10-CM

## 2021-08-24 DIAGNOSIS — Z79899 Other long term (current) drug therapy: Secondary | ICD-10-CM | POA: Diagnosis not present

## 2021-08-24 DIAGNOSIS — Z9181 History of falling: Secondary | ICD-10-CM | POA: Diagnosis not present

## 2021-08-24 DIAGNOSIS — I871 Compression of vein: Secondary | ICD-10-CM | POA: Insufficient documentation

## 2021-08-24 DIAGNOSIS — T451X5A Adverse effect of antineoplastic and immunosuppressive drugs, initial encounter: Secondary | ICD-10-CM | POA: Diagnosis not present

## 2021-08-24 DIAGNOSIS — Z888 Allergy status to other drugs, medicaments and biological substances status: Secondary | ICD-10-CM | POA: Insufficient documentation

## 2021-08-24 LAB — CBC WITH DIFFERENTIAL (CANCER CENTER ONLY)
Abs Immature Granulocytes: 0.02 K/uL (ref 0.00–0.07)
Basophils Absolute: 0.1 K/uL (ref 0.0–0.1)
Basophils Relative: 1 %
Eosinophils Absolute: 0.1 K/uL (ref 0.0–0.5)
Eosinophils Relative: 1 %
HCT: 36.5 % (ref 36.0–46.0)
Hemoglobin: 11.3 g/dL — ABNORMAL LOW (ref 12.0–15.0)
Immature Granulocytes: 0 %
Lymphocytes Relative: 9 %
Lymphs Abs: 0.5 K/uL — ABNORMAL LOW (ref 0.7–4.0)
MCH: 26.8 pg (ref 26.0–34.0)
MCHC: 31 g/dL (ref 30.0–36.0)
MCV: 86.7 fL (ref 80.0–100.0)
Monocytes Absolute: 0.5 K/uL (ref 0.1–1.0)
Monocytes Relative: 9 %
Neutro Abs: 4.6 K/uL (ref 1.7–7.7)
Neutrophils Relative %: 80 %
Platelet Count: 211 K/uL (ref 150–400)
RBC: 4.21 MIL/uL (ref 3.87–5.11)
RDW: 17.3 % — ABNORMAL HIGH (ref 11.5–15.5)
WBC Count: 5.8 K/uL (ref 4.0–10.5)
nRBC: 0 % (ref 0.0–0.2)

## 2021-08-24 LAB — CMP (CANCER CENTER ONLY)
ALT: 33 U/L (ref 0–44)
AST: 32 U/L (ref 15–41)
Albumin: 3.6 g/dL (ref 3.5–5.0)
Alkaline Phosphatase: 107 U/L (ref 38–126)
Anion gap: 8 (ref 5–15)
BUN: 19 mg/dL (ref 8–23)
CO2: 27 mmol/L (ref 22–32)
Calcium: 9.1 mg/dL (ref 8.9–10.3)
Chloride: 108 mmol/L (ref 98–111)
Creatinine: 1.13 mg/dL — ABNORMAL HIGH (ref 0.44–1.00)
GFR, Estimated: 50 mL/min — ABNORMAL LOW (ref >60)
Glucose, Bld: 98 mg/dL (ref 70–99)
Potassium: 4.6 mmol/L (ref 3.5–5.1)
Sodium: 143 mmol/L (ref 135–145)
Total Bilirubin: 1 mg/dL (ref 0.3–1.2)
Total Protein: 6.4 g/dL — ABNORMAL LOW (ref 6.5–8.1)

## 2021-08-24 LAB — LACTATE DEHYDROGENASE: LDH: 293 U/L — ABNORMAL HIGH (ref 98–192)

## 2021-08-24 MED ORDER — CEPHALEXIN 500 MG PO CAPS: 500.0000 mg | ORAL_CAPSULE | Freq: Two times a day (BID) | ORAL | 0 refills | Status: DC

## 2021-08-27 ENCOUNTER — Other Ambulatory Visit: Payer: Self-pay | Admitting: Physical Medicine and Rehabilitation

## 2021-08-27 NOTE — Telephone Encounter (Signed)
Pharmacy is sending refill request for Midodrine. Would you like to refill?

## 2021-08-28 ENCOUNTER — Encounter: Payer: Self-pay | Admitting: Physical Therapy

## 2021-08-28 ENCOUNTER — Ambulatory Visit: Payer: Medicare Other | Attending: Neurology | Admitting: Physical Therapy

## 2021-08-28 ENCOUNTER — Ambulatory Visit: Payer: Medicare Other | Admitting: Occupational Therapy

## 2021-08-28 ENCOUNTER — Other Ambulatory Visit: Payer: Self-pay

## 2021-08-28 VITALS — BP 119/97 | HR 91

## 2021-08-28 DIAGNOSIS — R41841 Cognitive communication deficit: Secondary | ICD-10-CM | POA: Insufficient documentation

## 2021-08-28 DIAGNOSIS — R2681 Unsteadiness on feet: Secondary | ICD-10-CM

## 2021-08-28 DIAGNOSIS — M6281 Muscle weakness (generalized): Secondary | ICD-10-CM | POA: Diagnosis not present

## 2021-08-28 DIAGNOSIS — R41840 Attention and concentration deficit: Secondary | ICD-10-CM | POA: Insufficient documentation

## 2021-08-28 DIAGNOSIS — R2689 Other abnormalities of gait and mobility: Secondary | ICD-10-CM | POA: Diagnosis not present

## 2021-08-28 DIAGNOSIS — H5410 Blindness, one eye, low vision other eye, unspecified eyes: Secondary | ICD-10-CM | POA: Insufficient documentation

## 2021-08-28 NOTE — Therapy (Signed)
[facility] [address], [place], [place], [address] Phone: [phone]   Fax:  [phone]  Physical Therapy Treatment/10th Visit Progress Note  Patient Details  Name: Kayla Harrison MRN: [id number] Date of Birth: 1946-08-19 Referring Provider (PT): Garvin Fila, MD   For progress note details, see clinical impression statement  Encounter Date: 08/28/2021   PT End of Session - 08/28/21 1105     Visit Number 10    Number of Visits 17    Date for PT Re-Evaluation 09/12/21    Authorization Type Medicare & MoO    PT Start Time 1020    PT Stop Time 1105    PT Time Calculation (min) 45 min    Equipment Utilized During Treatment Gait belt    Activity Tolerance Patient tolerated treatment well   reports being fatigued at end of session   Behavior During Therapy WFL for tasks assessed/performed             Past Medical History:  Diagnosis Date   Anemia    Arthritis    osteoarthritis - knees and right shoulder   Blood transfusion without reported diagnosis    Breast cancer ([place])    Dr Margot Chimes, total thyroidectomy- 1999- for cancer   Brucellosis 1964   Chronic bilateral pleural effusions    Colon polyp    Dr Earlean Shawl   Complication of anesthesia    Ketamine produces LSD reaction, bright colored nightmarish experience    Dyslipidemia    Endometriosis    Fibroids    H/O pleural effusion    s/p thoracentesis w 3227ml withdrawn   Hepatitis    [place] ?hepatitis    History of dysphagia    due to radiation therapy   History of hiatal hernia    small noted on PET scan   Hypertension    Hypothyroidism    Lung cancer, lower lobe ([place]) 01/2017   radiation RX completed 03/04/17; will start chemo 6/27, pt unaware of lung cancer   Morbid obesity ([place])    Status post lap band surgery   Nephrolithiasis    Non Hodgkin's lymphoma ([place])    on chemotherapy   Persistent atrial fibrillation  ([place])    a. s/p PVI 2008 b. s/p convergent ablation 3299 complicated by bradycardia requiring pacemaker implant   Personal history of radiation therapy    Presence of permanent cardiac pacemaker    Rotator cuff tear    Right   Stroke ([place])    2003- [place] x2   SVC syndrome    with lung mass and non hodgkins lymphoma   Thyroid cancer ([place]) 2000    Past Surgical History:  Procedure Laterality Date   ABDOMINAL HYSTERECTOMY  1983   afib ablation     a. 2008 PVI b. 2014 convergent ablation   APPENDECTOMY     BONE MARROW BIOPSY  02/21/2017   BREAST LUMPECTOMY Left 2010   [place]     2015- negative   CARDIOVERSION  10/09/2012   Procedure: CARDIOVERSION;  Surgeon: Minus Breeding, MD;  Location: [facility];  Service: Cardiovascular;  Laterality: N/A;   CARDIOVERSION  10/09/2012   Procedure: CARDIOVERSION;  Surgeon: Minus Breeding, MD;  Location: [facility] ENDOSCOPY;  Service: Cardiovascular;  Laterality: N/ARonalee Belts gave the ok to add pt to the add on , but we must check to find out if the can add pt  on at [phone])   CARDIOVERSION N/A 11/20/2012   Procedure: CARDIOVERSION;  Surgeon: Fay Records, MD;  Location: [facility];  Service: Cardiovascular;  Laterality: N/A;   CARDIOVERSION N/A 07/18/2017   Procedure: CARDIOVERSION;  Surgeon: Thayer Headings, MD;  Location: [place];  Service: Cardiovascular;  Laterality: N/A;   CARDIOVERSION N/A 10/03/2017   Procedure: CARDIOVERSION;  Surgeon: Sanda Klein, MD;  Location: [facility] ENDOSCOPY;  Service: Cardiovascular;  Laterality: N/A;   CARDIOVERSION N/A 01/07/2018   Procedure: CARDIOVERSION;  Surgeon: Acie Fredrickson Wonda Cheng, MD;  Location: [facility] ENDOSCOPY;  Service: Cardiovascular;  Laterality: N/A;   CARDIOVERSION N/A 12/10/2019   Procedure: CARDIOVERSION;  Surgeon: Buford Dresser, MD;  Location: [facility] ENDOSCOPY;  Service: Cardiovascular;  Laterality: N/A;   CARDIOVERSION N/A 03/09/2021   Procedure: CARDIOVERSION;  Surgeon:  Pixie Casino, MD;  Location: [place];  Service: Cardiovascular;  Laterality: N/A;   CHOLECYSTECTOMY     COLONOSCOPY W/ POLYPECTOMY     Dr Earlean Shawl   CYSTOSCOPY N/A 02/06/2015   Procedure: CYSTOSCOPY;  Surgeon: Kathie Rhodes, MD;  Location: [facility];  Service: Urology;  Laterality: N/A;   CYSTOSCOPY W/ RETROGRADES Left 11/17/2017   Procedure: CYSTOSCOPY WITH RETROGRADE /PYELOGRAM/;  Surgeon: Kathie Rhodes, MD;  Location: [facility];  Service: Urology;  Laterality: Left;   CYSTOSCOPY WITH RETROGRADE PYELOGRAM, URETEROSCOPY AND STENT PLACEMENT Right 02/06/2015   Procedure: RETROGRADE PYELOGRAM, RIGHT URETEROSCOPY STENT PLACEMENT;  Surgeon: Kathie Rhodes, MD;  Location: [facility];  Service: Urology;  Laterality: Right;   CYSTOSCOPY WITH RETROGRADE PYELOGRAM, URETEROSCOPY AND STENT PLACEMENT Right 03/07/2017   Procedure: CYSTOSCOPY WITH RIGHT RETROGRADE PYELOGRAM,RIGHT  URETEROSCOPYLASER LITHOTRIPSY  AND STENT PLACEMENT AND STONE BASKETRY;  Surgeon: Kathie Rhodes, MD;  Location: [facility];  Service: Urology;  Laterality: Right;   EYE SURGERY     cataract surgery   fatty mass removal  1999   pubic area   HOLMIUM LASER APPLICATION N/A 10/11/1476   Procedure: HOLMIUM LASER APPLICATION;  Surgeon: Kathie Rhodes, MD;  Location: [facility];  Service: Urology;  Laterality: N/A;   HOLMIUM LASER APPLICATION Right 2/95/6213   Procedure: HOLMIUM LASER APPLICATION;  Surgeon: Kathie Rhodes, MD;  Location: [facility];  Service: Urology;  Laterality: Right;   HOLMIUM LASER APPLICATION Left 0/86/5784   Procedure: HOLMIUM LASER APPLICATION;  Surgeon: Kathie Rhodes, MD;  [facility]: [facility];  Service: Urology;  Laterality: Left;   I & D EXTREMITY Left 12/19/2020   Procedure: IRRIGATION AND DEBRIDEMENT OF LEFT HIP HEMATOMA WITH APPLICATION OF WOUND VAC;  Surgeon: Erle Crocker, MD;  Location: [place];  Service: Orthopedics;  Laterality: Left;   I & D EXTREMITY Left 02/06/2021   Procedure:  IRRIGATION AND DEBRIDEMENT DEEP ABCESS LEFT THIGH, SECONDARY CLOSURE OF WOUND DEHISCENCE;  Surgeon: Erle Crocker, MD;  Location: [facility];  Service: Orthopedics;  Laterality: Left;   IR FLUORO GUIDE PORT INSERTION RIGHT  02/24/2017   IR NEPHROSTOMY PLACEMENT RIGHT  11/17/2017   IR PATIENT EVAL TECH 0-60 MINS  03/11/2017   IR REMOVAL TUN ACCESS W/ PORT W/O FL MOD SED  04/20/2018   IR US GUIDE VASC ACCESS RIGHT  02/24/2017   KNEE ARTHROSCOPY     bilateral   LAPAROSCOPIC GASTRIC BANDING  07/10/2010   LAPAROSCOPIC GASTRIC BANDING     Laparoscopic adjustable banding APS System with posterior hiatal hernia, 2 suture.   LAPAROTOMY     for ruptured ovary and ovarian artery    NEPHROLITHOTOMY Right 11/17/2017  Procedure: NEPHROLITHOTOMY PERCUTANEOUS;  Surgeon: Kathie Rhodes, MD;  [facility];  Service: Urology;  Laterality: Right;   PACEMAKER INSERTION  03/10/2013   MDT dual chamber PPM   POCKET REVISION N/A 12/08/2013   Procedure: POCKET REVISION;  Surgeon: Deboraha Sprang, MD;  Location: [facility] CATH LAB;  Service: Cardiovascular;  Laterality: N/A;   PORTA CATH INSERTION     REVERSE SHOULDER ARTHROPLASTY Right 05/14/2018   Procedure: RIGHT REVERSE SHOULDER ARTHROPLASTY;  Surgeon: Tania Ade, MD;  Location: [place];  Service: Orthopedics;  Laterality: Right;   REVERSE SHOULDER REPLACEMENT Right 05/14/2018   RIGHT HEART CATH N/A 07/21/2019   Procedure: RIGHT HEART CATH;  Surgeon: Larey Dresser, MD;  Location: [place] CV LAB;  Service: Cardiovascular;  Laterality: N/A;   TEE WITH CARDIOVERSION  09/22/2017   TEE WITHOUT CARDIOVERSION N/A 10/03/2017   Procedure: TRANSESOPHAGEAL ECHOCARDIOGRAM (TEE);  Surgeon: Sanda Klein, MD;  Location: [facility] ENDOSCOPY;  Service: Cardiovascular;  Laterality: N/A;   TEE WITHOUT CARDIOVERSION N/A 08/04/2019   Procedure: TRANSESOPHAGEAL ECHOCARDIOGRAM (TEE);  Surgeon: Larey Dresser, MD;  Location: [facility] ENDOSCOPY;  Service: Cardiovascular;  Laterality: N/A;    TEE WITHOUT CARDIOVERSION N/A 12/10/2019   Procedure: TRANSESOPHAGEAL ECHOCARDIOGRAM (TEE);  Surgeon: Buford Dresser, MD;  Location: [facility] ENDOSCOPY;  Service: Cardiovascular;  Laterality: N/A;   TEE WITHOUT CARDIOVERSION N/A 03/09/2021   Procedure: TRANSESOPHAGEAL ECHOCARDIOGRAM (TEE);  Surgeon: Pixie Casino, MD;  Location: [place];  Service: Cardiovascular;  Laterality: N/A;   THYROIDECTOMY  1998   Dr Margot Chimes   TONSILLECTOMY     TOTAL KNEE ARTHROPLASTY  04/13/2012   Procedure: TOTAL KNEE ARTHROPLASTY;  Surgeon: Rudean Haskell, MD;  Location: [facility];  Service: Orthopedics;  Laterality: Right;   VIDEO BRONCHOSCOPY WITH ENDOBRONCHIAL ULTRASOUND N/A 02/07/2017   Procedure: VIDEO BRONCHOSCOPY WITH ENDOBRONCHIAL ULTRASOUND;  Surgeon: Marshell Garfinkel, MD;  Location: [place];  Service: Pulmonary;  Laterality: N/A;    There were no vitals filed for this visit.   Subjective Assessment - 08/28/21 1022     Subjective Don't feel like I'm making much progress, and I want to keep working on my balance.    Pertinent History anemia, breast CA s/p surgery, lung CA s/p radiation and chemo, lymphoma on chemo, thyroid CA, HLD, hiatal hernia, HTN, a-fib, pacemaker, R reverse TSA 2019, stroke 2003, SVC syndrome, R TKA 2013    Diagnostic tests 03/15/21 head CT: Similar appearance of left caudothalamic hemorrhage with  intraventricular extension and mild adjacent edema. No new  hemorrhage. Unchanged mild trapping of the left lateral ventricle    Patient Stated Goals be able to get up off the floor    Currently in Pain? No/denies    Pain Onset In the past 7 days                               [facility] Adult PT Treatment/Exercise - 08/28/21 1025       Transfers   Transfers Sit to Stand;Stand to Sit    Sit to Stand 5: Supervision    Five time sit to stand comments  17.28   no UE support   Stand to Sit 5: Supervision      Ambulation/Gait   Ambulation/Gait Yes    Ambulation/Gait  Assistance 4: Min guard    Ambulation Distance (Feet) 40 Feet   x 2; 20-40 ft between activities in session   Assistive device Straight cane  Gait Pattern Step-to pattern;Decreased step length - left;Decreased step length - right;Right foot flat;Left foot flat;Right flexed knee in stance;Left flexed knee in stance;Poor foot clearance - left;Poor foot clearance - right;Trunk flexed;Step-through pattern    Ambulation Surface Level;Indoor      Standardized Balance Assessment   Standardized Balance Assessment Berg Balance Test;Timed Up and Go Test      Berg Balance Test   Sit to Stand Able to stand without using hands and stabilize independently    Standing Unsupported Able to stand safely 2 minutes    Sitting with Back Unsupported but Feet Supported on Floor or Stool Able to sit safely and securely 2 minutes    Stand to Sit Controls descent by using hands    Transfers Able to transfer safely, definite need of hands    Standing Unsupported with Eyes Closed Able to stand 10 seconds with supervision    Standing Ubsupported with Feet Together Able to place feet together independently and stand for 1 minute with supervision    From Standing, Reach Forward with Outstretched Arm Reaches forward but needs supervision    From Standing Position, Pick up Object from Floor Unable to pick up and needs supervision    From Standing Position, Turn to Look Behind Over each Shoulder Needs supervision when turning   posterior loss of balance   Turn 360 Degrees Needs close supervision or verbal cueing    Standing Unsupported, Alternately Place Feet on Step/Stool Able to complete >2 steps/needs minimal assist    Standing Unsupported, One Foot in Front Able to plae foot ahead of the other independently and hold 30 seconds    Standing on One Leg Unable to try or needs assist to prevent fall    Total Score 32      Timed Up and Go Test   TUG Normal TUG    Normal TUG (seconds) 21.25   cane     Knee/Hip Exercises:  Standing   Knee Flexion Strengthening;Right;Left;10 reps;1 set    Other Standing Knee Exercises Side step together, R and L, x 10 reps with UE support at counter, then single limb side step taps x 10 reps.  Cues for correct foot placement to avoid hip external rotation.             Access Code: [email or web address] URL: [email or web address] Date: 08/28/2021 Prepared by: [place] in [place].  Verbally reviewed full HEP, but had pt return demo the ones in bold.  Pt does report she is not performing seated hamstring curl with resistance, so d/c this from HEP.  Added standing hamstring curl to continue to address.  Sit to Stand with Counter Support - 1 x daily - 5 x weekly - 2 sets - 10 reps Side to Side Weight Shift with Counter Support - 1 x daily - 5 x weekly - 2 sets - 10 reps - 3 sec hold Gastroc Stretch on Wall - 1 x daily - 5 x weekly - 2 sets - 30 sec hold Seated Hamstring Curl with Anchored Resistance - 1 x daily - 5 x weekly - 2 sets - 10 reps Seated March - 1 x daily - 5 x weekly - 2 sets - 10 reps Performed 10 reps no resistance, then 10 reps red theraband resistance.  Supine Scapular Protraction in Flexion with Dumbbells - 1 x daily - 5 x weekly - 2 sets - 10 reps  PT Education - 08/28/21 1518     Education Details Additions to HEP-see instructions.  Discussed progress towards goals (doing some limited ambulating in therapy session with cane vs. RW); discussed continued fall risk per measures of TUG, Berg this visit.  Discussed importance of consistent HEP performance.    Person(s) Educated Patient    Methods Explanation;Demonstration;Handout    Comprehension Verbalized understanding;Returned demonstration              PT Short Term Goals - 07/27/21 1011       PT SHORT TERM GOAL #1   Title Patient to be independent with initial HEP.    Time 3    Period Weeks    Status Achieved     Target Date 08/08/21               PT Long Term Goals - 07/25/21 1224       PT LONG TERM GOAL #1   Title Patient to be independent with advanced HEP.    Time 8    Period Weeks    Status On-going      PT LONG TERM GOAL #2   Title Patient to demonstrate B LE strength >/=4+/5.    Time 8    Period Weeks    Status On-going      PT LONG TERM GOAL #3   Title Patient to score at least 46/56 on Berg in order to decrease risk of falls.    Time 8    Period Weeks    Status On-going      PT LONG TERM GOAL #4   Title Patient to complete TUG in <14 sec with LRAD in order to decrease risk of falls.    Time 8    Period Weeks    Status On-going      PT LONG TERM GOAL #5   Title Patient to demonstrate 5xSTS test in <15 sec in order to decrease risk of falls.    Time 8    Period Weeks    Status On-going      PT LONG TERM GOAL #6   Title Patient to demonstrate safe floor transfer with mod I.    Time 8    Period Weeks    Status On-going                   Plan - 08/28/21 1519     Clinical Impression Statement 10th Visit Progress note, covering dates 07/18/21-08/28/21.  Subjectively, pt reports wanting to continue to improve balance.  She reports feeling she isn't making a lot of progress.  Objective measures:  5x sit<>stand:  17.28 sec (improved slightly from eval, but pt able to perform with no UE support today), TUG score 21.25 sec (increased time compared to eval, but pt using SPC today instead of her RW), Berg score 32/56 (compared to 33/56 at eval).  Pt demonstrates posterior lean in static standing with Berg testing and she continues to have difficulty with single limb stance activities, unable to perform without UE support.  Updated HEP today to address standing exercises.  Pt remains at high fall risk per Berg Score.  Pt met STG for independence with initial HEP.  She is progressing towards LTGs; however, she continues to demo decreased strength, decreased balance,  decreased independence with gait and will benefit from further skilled PT to address strength, balance, gait, safety education for optimal functional mobility and decreased fall risk.    Personal Factors and Comorbidities Age;Fitness;Comorbidity  3+;Transportation;Time since onset of injury/illness/exacerbation;Past/Current Experience    Comorbidities anemia, breast CA s/p surgery, lung CA s/p radiation and chemo, lymphoma on chemo, thyroid CA, HLD, hiatal hernia, HTN, a-fib, pacemaker, R reverse TSA 2019, stroke 2003, SVC syndrome, R TKA 2013    Examination-Activity Limitations Bathing;Locomotion Level;Transfers;Bed Mobility;Reach Overhead;Bend;Carry;Squat;Dressing;Stairs;Stand;Hygiene/Grooming;Lift;Toileting    Examination-Participation Restrictions Laundry;Shop;Community Activity;Cleaning;Church;Meal Prep    Stability/Clinical Decision Making Stable/Uncomplicated    Rehab Potential Good    PT Frequency 2x / week    PT Duration 8 weeks    PT Treatment/Interventions ADLs/Self Care Home Management;Cryotherapy;Electrical Stimulation;DME Instruction;Moist Heat;Gait training;Stair training;Functional mobility training;Therapeutic activities;Therapeutic exercise;Balance training;Neuromuscular re-education;Manual techniques;Patient/family education;Passive range of motion;Dry needling;Energy conservation;Vestibular;Vasopneumatic Device;Taping    PT Next Visit Plan Review updates to HEP; balance training with SLS activities; progress LE stretching and strengthening to improve posture and gait deviations; continue gait training with SPC.  Pt having questions about progress with PT; should we discuss continueing gait with RW due to fall risk (so she may do more walking?) and should we ask if family is available for any training/education to optimize carryover outside of therapy sessions?    Consulted and Agree with Plan of Care Patient             Patient will benefit from skilled therapeutic intervention  in order to improve the following deficits and impairments:  Abnormal gait, Decreased range of motion, Difficulty walking, Increased fascial restricitons, Increased muscle spasms, Decreased safety awareness, Decreased endurance, Cardiopulmonary status limiting activity, Decreased activity tolerance, Decreased balance, Impaired flexibility, Improper body mechanics, Postural dysfunction, Decreased strength  Visit Diagnosis: Unsteadiness on feet  Other abnormalities of gait and mobility  Muscle weakness (generalized)     Problem List Patient Active Problem List   Diagnosis Date Noted   Hypertension    Pressure injury of skin 03/15/2021   Intracranial hemorrhage (HCC) 03/14/2021   Lower extremity edema    Polymicrobial bacterial infection    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    Enterococcus faecalis infection    Sepsis due to Escherichia coli ([place])    Debility 02/13/2021   Infection of wound hematoma 02/05/2021   Physical debility 12/22/2020   Tear of left hamstring 12/22/2020   Labral tear of left hip joint 12/22/2020   Acute blood loss anemia    Orthostatic hypotension 12/17/2020   History of CVA (cerebrovascular accident) 12/17/2020   Fall 12/15/2020   Hip hematoma, left, initial encounter 12/15/2020   Nondisplaced fracture of coronoid process of left ulna, initial encounter for closed fracture 12/15/2020   Multiple rib fractures 12/15/2020   COVID-19 virus infection 10/12/2020   Constipation 06/22/2020   Change in bowel habits 06/22/2020   Diverticulosis 06/22/2020   Dark stools 06/22/2020   Hemorrhoids 06/22/2020   Pacemaker 01/23/2020   Junctional rhythm 01/23/2020   Palpitations 01/23/2020   ICH (intracerebral hemorrhage) ([place]) 12/02/2019   A-fib ([place]) 12/02/2019   Anemia 12/02/2019   QT prolongation 12/02/2019   Hypothyroidism 12/02/2019   Gait abnormality 07/10/2018   S/P reverse total shoulder arthroplasty, 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   Persistent atrial  fibrillation ([place])    Bilateral ureteral calculi 11/17/2017   Atrial fibrillation with RVR ([place]) 09/15/2017   Atrial flutter ([place]) 07/17/2017   Port catheter in place 04/02/2017   Non-Hodgkin lymphoma, unspecified, intrathoracic lymph nodes ([place]) 02/13/2017   Mediastinal mass 02/06/2017   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   Abnormal chest x-ray    Lung mass 02/03/2017   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   OSA (  obstructive sleep apnea) 02/03/2015   History of renal calculi 11/16/2014   Renal calculi 11/16/2014   Family history of colon cancer 10/26/2014   Mechanical complication due to cardiac pacemaker pulse generator 11/06/2013   Dyspnea 05/12/2013   Hypothyroidism 04/21/2013   Pleural effusion 04/19/2013   Long term (current) use of anticoagulants 12/20/2010   EPIDERMOID CYST 08/22/2010   Chronic diastolic heart failure ([place]) 08/06/2010   SYNCOPE AND COLLAPSE 07/27/2010   OSTEOARTHRITIS, KNEE, RIGHT 03/15/2010   Class 1 obesity due to excess calories with body mass index (BMI) of 33.0 to 33.9 in adult 12/07/2009   NONSPEC ELEVATION OF LEVELS OF TRANSAMINASE/LDH 09/08/2009   BREAST CANCER, HX OF 07/25/2009   COLONIC POLYPS, HX OF 07/25/2009   TUBULOVILLOUS ADENOMA, COLON 04/29/2008   HYPERGLYCEMIA, FASTING 04/29/2008   HYPERLIPIDEMIA 06/22/2007   CARCINOMA, THYROID GLAND, HX OF 06/22/2007    Artha Stavros W., PT 08/28/2021, 3:28 PM  [facility] [address] [place], [place], [address] Phone: [phone]   Fax:  [phone]  Name: Kayla Harrison MRN: [id number] Date of Birth: 02/23/1946

## 2021-08-28 NOTE — Therapy (Signed)
[facility] [address], [place], [place], [address] Phone: [phone]   Fax:  [phone]  Occupational Therapy Treatment and Progress Update  Patient Details  Name: Kayla Harrison MRN: [id number] Date of Birth: [birth year] Referring Provider (OT): Leonie Man   Encounter Date: 08/28/2021   OT End of Session - 08/28/21 0928     Visit Number 10    Number of Visits 17    Date for OT Re-Evaluation 09/12/21    Authorization Type Medicare A and B    Progress Note Due on Visit 10    OT Start Time 0930    OT Stop Time 1015    OT Time Calculation (min) 45 min    Activity Tolerance Patient tolerated treatment well    Behavior During Therapy WFL for tasks assessed/performed             Past Medical History:  Diagnosis Date   Anemia    Arthritis    osteoarthritis - knees and right shoulder   Blood transfusion without reported diagnosis    Breast cancer ([place])    Dr Margot Chimes, total thyroidectomy- 1999- for cancer   Brucellosis 1964   Chronic bilateral pleural effusions    Colon polyp    Dr Earlean Shawl   Complication of anesthesia    Ketamine produces LSD reaction, bright colored nightmarish experience    Dyslipidemia    Endometriosis    Fibroids    H/O pleural effusion    s/p thoracentesis w 3273m withdrawn   Hepatitis    [place], ?hepatitis    History of dysphagia    due to radiation therapy   History of hiatal hernia    small noted on PET scan   Hypertension    Hypothyroidism    Lung cancer, lower lobe ([place] 01[place]   radiation RX completed 03/04/17; will start chemo 6/27, pt unaware of lung cancer   Morbid obesity ([place]    Status post lap band surgery   Nephrolithiasis    Non Hodgkin's lymphoma ([place]    on chemotherapy   Persistent atrial fibrillation ([place]    a. s/p PVI 2008 b. s/p convergent ablation [id number] by bradycardia requiring pacemaker implant   Personal history of  radiation therapy    Presence of permanent cardiac pacemaker    Rotator cuff tear    Right   St[place]   SVC syndrome    with lung mass and non hodgkins lymphoma   Thyroid cancer ([place] 2000    Past Surgical History:  Procedure Laterality Date   ABDOMINAL HYSTERECTOMY  1983   afib ablation     a. 2008 PVI b. 2014 convergent ablation   APPENDECTOMY     BONE MARROW BIOPSY  02/21/2017   BREAST LUMPECTOMY Left 2010   [place]    2015- negative   CARDIOVERSION  10/09/2012   Procedure: CARDIOVERSION;  Surgeon: JMinus Breeding MD;  [facility] [facility]  Service: Cardiovascular;  Laterality: N/A;   CARDIOVERSION  10/09/2012   Procedure: CARDIOVERSION;  Surgeon: JMinus Breeding MD;  [facility]  Service: Cardiovascular;  Laterality: N/A;  [name] the ok to add pt to the add on , but we must check to find out if the can add pt on at 1400 ( [phone])   CARDIOVERSION N/A 11/20/2012   Procedure: CARDIOVERSION;  Surgeon: PFay Records  MD;  Location: [place];  Service: Cardiovascular;  Laterality: N/A;   CARDIOVERSION N/A 07/18/2017   Procedure: CARDIOVERSION;  Surgeon: Thayer Headings, MD;  Location: [place];  Service: Cardiovascular;  Laterality: N/A;   CARDIOVERSION N/A 10/03/2017   Procedure: CARDIOVERSION;  Surgeon: Sanda Klein, MD;  [facility];  Service: Cardiovascular;  Laterality: N/A;   CARDIOVERSION N/A 01/07/2018   Procedure: CARDIOVERSION;  Surgeon: Thayer Headings, MD;  Location[facility] ENDOSCOPY;  Service: Cardiovascular;  Laterality: N/A;   CARDIOVERSION N/A 12/10/2019   Procedure: CARDIOVERSION;  Surgeon: Buford Dresser, MD;  Location: [facility] ENDOSCOPY;  Service: Cardiovascular;  Laterality: N/A;   CARDIOVERSION N/A 03/09/2021   Procedure: CARDIOVERSION;  Surgeon: Pixie Casino, MD;  Location: [place];  Service: Cardiovascular;  Laterality: N/A;   CHOLECYSTECTOMY     COLONOSCOPY W/  POLYPECTOMY     Dr Earlean Shawl   CYSTOSCOPY N/A 02/06/2015   Procedure: CYSTOSCOPY;  Surgeon: Kathie Rhodes, MD;  [facility];  Service: Urology;  Laterality: N/A;   CYSTOSCOPY W/ RETROGRADES Left 11/17/2017   Procedure: CYSTOSCOPY WITH RETROGRADE /PYELOGRAM/;  Surgeon: Kathie Rhodes, MD;  [facility];  Service: Urology;  Laterality: Left;   CYSTOSCOPY WITH RETROGRADE PYELOGRAM, URETEROSCOPY AND STENT PLACEMENT Right 02/06/2015   Procedure: RETROGRADE PYELOGRAM, RIGHT URETEROSCOPY STENT PLACEMENT;  Surgeon: Kathie Rhodes, MD;  [facility];  Service: Urology;  Laterality: Right;   CYSTOSCOPY WITH RETROGRADE PYELOGRAM, URETEROSCOPY AND STENT PLACEMENT Right 03/07/2017   Procedure: CYSTOSCOPY WITH RIGHT RETROGRADE PYELOGRAM,RIGHT  URETEROSCOPYLASER LITHOTRIPSY  AND STENT PLACEMENT AND STONE BASKETRY;  Surgeon: Kathie Rhodes, MD;  Location: [place];  Service: Urology;  Laterality: Right;   EYE SURGERY     cataract surgery   fatty mass removal  1999   pubic area   HOLMIUM LASER APPLICATION N/A 2/95/1884   Procedure: HOLMIUM LASER APPLICATION;  Surgeon: Kathie Rhodes, MD;  [facility];  Service: Urology;  Laterality: N/A;   HOLMIUM LASER APPLICATION Right 1/66/0630   Procedure: HOLMIUM LASER APPLICATION;  Surgeon: Kathie Rhodes, MD;  Location: [facility];  Service: Urology;  Laterality: Right;   HOLMIUM LASER APPLICATION Left 1/60/1093   Procedure: HOLMIUM LASER APPLICATION;  Surgeon: Kathie Rhodes, MD;  [facility];  Service: Urology;  Laterality: Left;   I & D EXTREMITY Left 12/19/2020   Procedure: IRRIGATION AND DEBRIDEMENT OF LEFT HIP HEMATOMA WITH APPLICATION OF WOUND VAC;  Surgeon: Erle Crocker, MD;  Location: [place];  Service: Orthopedics;  Laterality: Left;   I & D EXTREMITY Left 02/06/2021   Procedure: IRRIGATION AND DEBRIDEMENT DEEP ABCESS LEFT THIGH, SECONDARY CLOSURE OF WOUND DEHISCENCE;  Surgeon: Erle Crocker, MD;  Location:  [place];  Service: Orthopedics;  Laterality: Left;   IR FLUORO GUIDE PORT INSERTION RIGHT  02/24/2017   IR NEPHROSTOMY PLACEMENT RIGHT  11/17/2017   IR PATIENT EVAL TECH 0-60 MINS  03/11/2017   IR REMOVAL TUN ACCESS W/ PORT W/O FL MOD SED  04/20/2018   IR US GUIDE VASC ACCESS RIGHT  02/24/2017   KNEE ARTHROSCOPY     bilateral   LAPAROSCOPIC GASTRIC BANDING  07/10/2010   LAPAROSCOPIC GASTRIC BANDING     Laparoscopic adjustable banding APS System with posterior hiatal hernia, 2 suture.   LAPAROTOMY     for ruptured ovary and ovarian artery    NEPHROLITHOTOMY Right 11/17/2017   Procedure: NEPHROLITHOTOMY PERCUTANEOUS;  Surgeon: Kathie Rhodes, MD;  [facility];  Service: Urology;  Laterality: Right;  PACEMAKER INSERTION  03/10/2013   MDT dual chamber PPM   POCKET REVISION N/A 12/08/2013   Procedure: POCKET REVISION;  Surgeon: Deboraha Sprang, MD;  Location: [facility] CATH LAB;  Service: Cardiovascular;  Laterality: N/A;   PORTA CATH INSERTION     REVERSE SHOULDER ARTHROPLASTY Right 05/14/2018   Procedure: RIGHT REVERSE SHOULDER ARTHROPLASTY;  Surgeon: Tania Ade, MD;  Location: [place];  Service: Orthopedics;  Laterality: Right;   REVERSE SHOULDER REPLACEMENT Right 05/14/2018   RIGHT HEART CATH N/A 07/21/2019   Procedure: RIGHT HEART CATH;  Surgeon: Larey Dresser, MD;  Location: [place] CV LAB;  Service: Cardiovascular;  Laterality: N/A;   TEE WITH CARDIOVERSION  09/22/2017   TEE WITHOUT CARDIOVERSION N/A 10/03/2017   Procedure: TRANSESOPHAGEAL ECHOCARDIOGRAM (TEE);  Surgeon: Sanda Klein, MD;  Location: [facility];  Service: Cardiovascular;  Laterality: N/A;   TEE WITHOUT CARDIOVERSION N/A 08/04/2019   Procedure: TRANSESOPHAGEAL ECHOCARDIOGRAM (TEE);  Surgeon: Larey Dresser, MD;  Location: [facility] ENDOSCOPY;  Service: Cardiovascular;  Laterality: N/A;   TEE WITHOUT CARDIOVERSION N/A 12/10/2019   Procedure: TRANSESOPHAGEAL ECHOCARDIOGRAM (TEE);  Surgeon: Buford Dresser, MD;   Location: [facility] ENDOSCOPY;  Service: Cardiovascular;  Laterality: N/A;   TEE WITHOUT CARDIOVERSION N/A 03/09/2021   Procedure: TRANSESOPHAGEAL ECHOCARDIOGRAM (TEE);  Surgeon: Pixie Casino, MD;  Location: [place];  Service: Cardiovascular;  Laterality: N/A;   THYROIDECTOMY  1998   Dr Margot Chimes   TONSILLECTOMY     TOTAL KNEE ARTHROPLASTY  04/13/2012   Procedure: TOTAL KNEE ARTHROPLASTY;  Surgeon: Rudean Haskell, MD;  Location: [place];  Service: Orthopedics;  Laterality: Right;   VIDEO BRONCHOSCOPY WITH ENDOBRONCHIAL ULTRASOUND N/A 02/07/2017   Procedure: VIDEO BRONCHOSCOPY WITH ENDOBRONCHIAL ULTRASOUND;  Surgeon: Marshell Garfinkel, MD;  Location: [place];  Service: Pulmonary;  Laterality: N/A;    Vitals:   08/28/21 1004  BP: (!) 119/97  Pulse: 91  SpO2: 97%     Subjective Assessment - 08/28/21 0928     Subjective  Pt reports frustrations and concerns with her slow progress to return to PLOF.    Pertinent History h/o hyperlipidemia, atrial fibrillation with pacemaker on Eliquis, lymphoma, confusion, subcortical hemorrhage on the left with intraventricular extension    Currently in Pain? No/denies    Pain Onset In the past 7 days              Extensive time spent discussing pt progress towards goals and pt perceptions of progress overall.  Pt voicing concerns about her slow progress, reporting that her balance is most concerning to her.  Pt reports using cane more at home to continue to challenge and improve her balance.  Encouraged pt to speak more with PT to assess safety with mobility using Rollator vs SPC.  Discussed energy conservation strategies and recommendation for use of Rollator during IADLs to allow pt to sit as needed during tasks.  Engaged in functional mobility around therapy gym with focus on visual scanning to locate targets and functional reach into low, mid, and high range to simulate functional tasks in home when obtaining items.  Pt with no complaints of dizziness, but  did report mild lightheadedness at completion of task.  Vitals assessed, see above.  Pt tolerated activity for 13 mins before requiring seated rest break.                     OT Education - 08/29/21 1040     Education Details Educated on importance of completing theraputy and  [place] HEP exercises at home for handwriting.  Recommended SLP referral and engaged in lengthy discussion about working memory, safety awareness, and awareness of impairments.    Person(s) Educated Patient    Methods Explanation    Comprehension Verbalized understanding;Need further instruction              OT Short Term Goals - 08/29/21 1009       OT SHORT TERM GOAL #1   Title Pt will verbalize understanding of 3 compensatory strategies for low vision.   requires min cues to recall modifications   Time 4    Period Weeks    Status Partially Met    Target Date 08/15/21      OT SHORT TERM GOAL #2   Title Pt will improve hand strength and endurance to improve handwriting legiblity to 90%    Time 4    Period Weeks    Status Partially Met      OT SHORT TERM GOAL #3   Title Pt will demonstrate improved activity tolerance to complete standing acitivity for 15 mins without seated rest break.    Time 4    Period Weeks    Status Not Met   pt able to stand 13 mins during activity before requring seated rest break     OT SHORT TERM GOAL #4   Title Pt will improve shoulder ROM to 140* in RUE to increase functional reach in to cabinets    Time 4    Period Weeks    Status Achieved               OT Long Term Goals - 08/29/21 1010       OT LONG TERM GOAL #1   Title Pt will be independent in energy conservation strategies.    Time 8    Period Weeks    Status On-going      OT LONG TERM GOAL #2   Title Pt will be independent in modifications for low vision.    Time 8    Period Weeks    Status Achieved      OT LONG TERM GOAL #3   Title Pt wil be able to complete housekeeping task  incorporating reaching outside BOS without reports of dizziness.    Time 8    Period Weeks    Status On-going      OT LONG TERM GOAL #4   Title Pt will be able to complete dual task activity without cues for sequencing and with improved safety awareness.    Time 8    Period Weeks    Status On-going                   Plan - 08/28/21 0929     Clinical Impression Statement This progress note covers dates of service from 07/18/21 through 08/28/21.  Pt is not showing progress from session to session over this time due to instances of illness and persistent fatigue, however has shown progression towards goals.  Pt is demonstrating much improved balance and endurance overall.  Pt is able to tolerate standing up to 13 mins during functional mobility activity with CGA - close supervision when ambulating with SPC.  Pt is able to retrieve items from low, mid, and high reach during functional activities.  Pt continues to benefit from mod-max cues for recall of instructions, sequencing, and awareness during tasks. Pt reports not compleing theraputty or theraband exercises as she does not see the value in  them.  Therapist discussed with pt observations of impaired cognition with working memory, Conservator, museum/gallery, sequencing, and awareness of deficits and situational awareness - recommending SLP evaluation.  Pt initially in agreement, but also appears overwhelmed by increased therapy.    OT Occupational Profile and History Detailed Assessment- Review of Records and additional review of physical, cognitive, psychosocial history related to current functional performance    Occupational performance deficits (Please refer to evaluation for details): ADL's;IADL's;Leisure;Social Participation    Body Structure / Function / Physical Skills ADL;Balance;Body mechanics;Cardiopulmonary status limiting activity;Coordination;Endurance;FMC;GMC;IADL;Mobility;Pain;ROM;Proprioception;Sensation;Strength;UE functional  use;Vision    Psychosocial Skills Environmental  Adaptations;Routines and Behaviors    Rehab Potential Good    Clinical Decision Making Limited treatment options, no task modification necessary    Comorbidities Affecting Occupational Performance: May have comorbidities impacting occupational performance    Modification or Assistance to Complete Evaluation  No modification of tasks or assist necessary to complete eval    OT Frequency 2x / week    OT Duration 8 weeks    OT Treatment/Interventions Self-care/ADL training;Cryotherapy;Electrical Stimulation;Ultrasound;Moist Heat;Fluidtherapy;Therapeutic exercise;Neuromuscular education;Energy conservation;Manual Therapy;Functional Mobility Training;DME and/or AE instruction;Passive range of motion;Patient/family education;Visual/perceptual remediation/compensation;Cognitive remediation/compensation;Therapeutic activities;Balance training;Psychosocial skills training;Coping strategies training    Plan handwriting to include grocery list and phone message.  Dynamic balance/mobility while addressing visual fields and activity tolerance for IADLs.    Consulted and Agree with Plan of Care Patient             Patient will benefit from skilled therapeutic intervention in order to improve the following deficits and impairments:   Body Structure / Function / Physical Skills: ADL, Balance, Body mechanics, Cardiopulmonary status limiting activity, Coordination, Endurance, FMC, GMC, IADL, Mobility, Pain, ROM, Proprioception, Sensation, Strength, UE functional use, Vision   Psychosocial Skills: Environmental  Adaptations, Routines and Behaviors   Visit Diagnosis: Unsteadiness on feet  Muscle weakness (generalized)  Attention and concentration deficit  Other abnormalities of gait and mobility    Problem List Patient Active Problem List   Diagnosis Date Noted   Hypertension    Pressure injury of skin 03/15/2021   Intracranial hemorrhage ([place])  03/14/2021   Lower extremity edema    Polymicrobial bacterial infection    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    Enterococcus faecalis infection    Sepsis due to Escherichia coli ([place])    Debility 02/13/2021   Infection of wound hematoma 02/05/2021   Physical debility 12/22/2020   Tear of left hamstring 12/22/2020   Labral tear of left hip joint 12/22/2020   Acute blood loss anemia    Orthostatic hypotension 12/17/2020   History of CVA (cerebrovascular accident) 12/17/2020   Fall 12/15/2020   Hip hematoma, left, initial encounter 12/15/2020   Nondisplaced fracture of coronoid process of left ulna, initial encounter for closed fracture 12/15/2020   Multiple rib fractures 12/15/2020   COVID-19 virus infection 10/12/2020   Constipation 06/22/2020   Change in bowel habits 06/22/2020   Diverticulosis 06/22/2020   Dark stools 06/22/2020   Hemorrhoids 06/22/2020   Pacemaker 01/23/2020   Junctional rhythm 01/23/2020   Palpitations 01/23/2020   ICH (intracerebral hemorrhage) ([place]) 12/02/2019   A-fib ([place]) 12/02/2019   Anemia 12/02/2019   QT prolongation 12/02/2019   Hypothyroidism 12/02/2019   Gait abnormality 07/10/2018   S/P reverse total shoulder arthroplasty, 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   Persistent atrial fibrillation ([place])    Bilateral ureteral calculi 11/17/2017   Atrial fibrillation with RVR ([place]) 09/15/2017   Atrial flutter ([place]) 07/17/2017   Port catheter in place  04/02/2017   Non-Hodgkin lymphoma, unspecified, intrathoracic lymph nodes ([place]) 02/13/2017   Mediastinal mass 02/06/2017   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[place]) 02/06/2017   Abnormal chest x-ray    Lung mass 02/03/2017   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   OSA (obstructive sleep apnea) 02/03/2015   History of renal calculi 11/16/2014   Renal calculi 11/16/2014   Family history of colon cancer 10/26/2014   Mechanical complication due to cardiac pacemaker pulse generator 11/06/2013   Dyspnea 05/12/2013    Hypothyroidism 04/21/2013   Pleural effusion 04/19/2013   Long term (current) use of anticoagulants 12/20/2010   EPIDERMOID CYST 08/22/2010   Chronic diastolic heart failure ([place]) 08/06/2010   SYNCOPE AND COLLAPSE 07/27/2010   OSTEOARTHRITIS, KNEE, RIGHT 03/15/2010   Class 1 obesity due to excess calories with body mass index (BMI) of 33.0 to 33.9 in adult 12/07/2009   NONSPEC ELEVATION OF LEVELS OF TRANSAMINASE/LDH 09/08/2009   BREAST CANCER, HX OF 07/25/2009   COLONIC POLYPS, HX OF 07/25/2009   TUBULOVILLOUS ADENOMA, COLON 04/29/2008   HYPERGLYCEMIA, FASTING 04/29/2008   HYPERLIPIDEMIA 06/22/2007   CARCINOMA, THYROID GLAND, HX OF 06/22/2007    Simonne Come, OT 08/29/2021, 2:57 PM  [facility] [address] [place], [place], [address] Phone: [phone]   Fax:  [phone]  Name: ASHAYLA SUBIA MRN: [id number] Date of Birth: [birth year]

## 2021-08-28 NOTE — Patient Instructions (Addendum)
Access Code: [email or web address] URL: [email or web address] Date: 08/28/2021 Prepared by: [facility]  Program Notes remember to bring in your cane next time!   Exercises Sit to Stand with Counter Support - 1 x daily - 5 x weekly - 2 sets - 10 reps Side to Side Weight Shift with Counter Support - 1 x daily - 5 x weekly - 2 sets - 10 reps - 3 sec hold Gastroc Stretch on Wall - 1 x daily - 5 x weekly - 2 sets - 30 sec hold Seated March - 1 x daily - 5 x weekly - 2 sets - 10 reps Supine Scapular Protraction in Flexion with Dumbbells - 1 x daily - 5 x weekly - 2 sets - 10 reps  Added 08/28/2021: Side Stepping with Counter Support - 1 x daily - 5 x weekly - 2 sets - 10 reps Standing Knee Flexion - 1 x daily - 5 x weekly - 2 sets - 10 reps

## 2021-08-29 ENCOUNTER — Encounter: Payer: Self-pay | Admitting: Occupational Therapy

## 2021-08-31 ENCOUNTER — Encounter: Payer: Self-pay | Admitting: Occupational Therapy

## 2021-08-31 ENCOUNTER — Ambulatory Visit: Payer: Medicare Other | Admitting: Physical Therapy

## 2021-08-31 ENCOUNTER — Encounter: Payer: Self-pay | Admitting: Physical Therapy

## 2021-08-31 ENCOUNTER — Ambulatory Visit: Payer: Medicare Other | Admitting: Occupational Therapy

## 2021-08-31 ENCOUNTER — Other Ambulatory Visit: Payer: Self-pay

## 2021-08-31 VITALS — BP 122/80 | HR 100

## 2021-08-31 DIAGNOSIS — H5410 Blindness, one eye, low vision other eye, unspecified eyes: Secondary | ICD-10-CM

## 2021-08-31 DIAGNOSIS — R2681 Unsteadiness on feet: Secondary | ICD-10-CM

## 2021-08-31 DIAGNOSIS — R2689 Other abnormalities of gait and mobility: Secondary | ICD-10-CM | POA: Diagnosis not present

## 2021-08-31 DIAGNOSIS — R41840 Attention and concentration deficit: Secondary | ICD-10-CM | POA: Diagnosis not present

## 2021-08-31 DIAGNOSIS — M6281 Muscle weakness (generalized): Secondary | ICD-10-CM | POA: Diagnosis not present

## 2021-08-31 DIAGNOSIS — R41841 Cognitive communication deficit: Secondary | ICD-10-CM | POA: Diagnosis not present

## 2021-08-31 NOTE — Therapy (Signed)
[facility], [place], [place], [address] Phone: [phone]   Fax:  [phone]  Occupational Therapy Treatment  Patient Details  Name: Kayla Harrison MRN: [id number] Date of Birth: 1946/01/08 Referring Provider (OT): Leonie Man   Encounter Date: 08/31/2021   OT End of Session - 08/31/21 0843     Visit Number 11    Number of Visits 17    Date for OT Re-Evaluation 09/12/21    Authorization Type Medicare A and B    Authorization - Visit Number 1    Authorization - Number of Visits 10    Progress Note Due on Visit 10    OT Start Time 0845    OT Stop Time 0935    OT Time Calculation (min) 50 min    Activity Tolerance Patient tolerated treatment well    Behavior During Therapy WFL for tasks assessed/performed             Past Medical History:  Diagnosis Date   Anemia    Arthritis    osteoarthritis - knees and right shoulder   Blood transfusion without reported diagnosis    Breast cancer ([place])    Dr Margot Chimes, total thyroidectomy- 1999- for cancer   Brucellosis 1964   Chronic bilateral pleural effusions    Colon polyp    Dr Earlean Shawl   Complication of anesthesia    Ketamine produces LSD reaction, bright colored nightmarish experience    Dyslipidemia    Endometriosis    Fibroids    H/O pleural effusion    s/p thoracentesis w 3275ml withdrawn   Hepatitis    [place] ?hepatitis    History of dysphagia    due to radiation therapy   History of hiatal hernia    small noted on PET scan   Hypertension    Hypothyroidism    Lung cancer, lower lobe ([place]) 01/2017   radiation RX completed 03/04/17; will start chemo 6/27, pt unaware of lung cancer   Morbid obesity ([place])    Status post lap band surgery   Nephrolithiasis    Non Hodgkin's lymphoma ([place])    on chemotherapy   Persistent atrial fibrillation ([place])    a. s/p PVI 2008 b. s/p convergent ablation 3435 complicated by  bradycardia requiring pacemaker implant   Personal history of radiation therapy    Presence of permanent cardiac pacemaker    Rotator cuff tear    Right   Stroke ([place])    2003- [place] x2   SVC syndrome    with lung mass and non hodgkins lymphoma   Thyroid cancer ([place]) 2000    Past Surgical History:  Procedure Laterality Date   ABDOMINAL HYSTERECTOMY  1983   afib ablation     a. 2008 PVI b. 2014 convergent ablation   APPENDECTOMY     BONE MARROW BIOPSY  02/21/2017   BREAST LUMPECTOMY Left 2010   [place]     2015- negative   CARDIOVERSION  10/09/2012   Procedure: CARDIOVERSION;  Surgeon: Minus Breeding, MD;  Location: [place];  Service: Cardiovascular;  Laterality: N/A;   CARDIOVERSION  10/09/2012   Procedure: CARDIOVERSION;  Surgeon: Minus Breeding, MD;  Location: [facility] ENDOSCOPY;  Service: Cardiovascular;  Laterality: N/A;  [name] gave the ok to add pt to the add on , but we must check to find out if the can add pt on at [phone] ( [phone])  CARDIOVERSION N/A 11/20/2012   Procedure: CARDIOVERSION;  Surgeon: Fay Records, MD;  Location: [place];  Service: Cardiovascular;  Laterality: N/A;   CARDIOVERSION N/A 07/18/2017   Procedure: CARDIOVERSION;  Surgeon: Thayer Headings, MD;  Location: [place];  Service: Cardiovascular;  Laterality: N/A;   CARDIOVERSION N/A 10/03/2017   Procedure: CARDIOVERSION;  Surgeon: Sanda Klein, MD;  Location: [facility] ENDOSCOPY;  Service: Cardiovascular;  Laterality: N/A;   CARDIOVERSION N/A 01/07/2018   Procedure: CARDIOVERSION;  Surgeon: Acie Fredrickson Wonda Cheng, MD;  Location: [facility] ENDOSCOPY;  Service: Cardiovascular;  Laterality: N/A;   CARDIOVERSION N/A 12/10/2019   Procedure: CARDIOVERSION;  Surgeon: Buford Dresser, MD;  Location: [facility] ENDOSCOPY;  Service: Cardiovascular;  Laterality: N/A;   CARDIOVERSION N/A 03/09/2021   Procedure: CARDIOVERSION;  Surgeon: Pixie Casino, MD;  Location: [place];  Service: Cardiovascular;   Laterality: N/A;   CHOLECYSTECTOMY     COLONOSCOPY W/ POLYPECTOMY     Dr Earlean Shawl   CYSTOSCOPY N/A 02/06/2015   Procedure: CYSTOSCOPY;  Surgeon: Kathie Rhodes, MD;  Location: [facility];  Service: Urology;  Laterality: N/A;   CYSTOSCOPY W/ RETROGRADES Left 11/17/2017   Procedure: CYSTOSCOPY WITH RETROGRADE /PYELOGRAM/;  Surgeon: Kathie Rhodes, MD;  Location: [facility];  Service: Urology;  Laterality: Left;   CYSTOSCOPY WITH RETROGRADE PYELOGRAM, URETEROSCOPY AND STENT PLACEMENT Right 02/06/2015   Procedure: RETROGRADE PYELOGRAM, RIGHT URETEROSCOPY STENT PLACEMENT;  Surgeon: Kathie Rhodes, MD;  Location: [facility];  Service: Urology;  Laterality: Right;   CYSTOSCOPY WITH RETROGRADE PYELOGRAM, URETEROSCOPY AND STENT PLACEMENT Right 03/07/2017   Procedure: CYSTOSCOPY WITH RIGHT RETROGRADE PYELOGRAM,RIGHT  URETEROSCOPYLASER LITHOTRIPSY  AND STENT PLACEMENT AND STONE BASKETRY;  Surgeon: Kathie Rhodes, MD;  Location: [place];  Service: Urology;  Laterality: Right;   EYE SURGERY     cataract surgery   fatty mass removal  1999   pubic area   HOLMIUM LASER APPLICATION N/A 9/50/7225   Procedure: HOLMIUM LASER APPLICATION;  Surgeon: Kathie Rhodes, MD;  Location: [facility];  Service: Urology;  Laterality: N/A;   HOLMIUM LASER APPLICATION Right 7/50/5183   Procedure: HOLMIUM LASER APPLICATION;  Surgeon: Kathie Rhodes, MD;  Location: [facility];  Service: Urology;  Laterality: Right;   HOLMIUM LASER APPLICATION Left 3/58/2518   Procedure: HOLMIUM LASER APPLICATION;  Surgeon: Kathie Rhodes, MD;  Location: [facility];  Service: Urology;  Laterality: Left;   I & D EXTREMITY Left 12/19/2020   Procedure: IRRIGATION AND DEBRIDEMENT OF LEFT HIP HEMATOMA WITH APPLICATION OF WOUND VAC;  Surgeon: Erle Crocker, MD;  Location: [place];  Service: Orthopedics;  Laterality: Left;   I & D EXTREMITY Left 02/06/2021   Procedure: IRRIGATION AND DEBRIDEMENT DEEP ABCESS LEFT THIGH, SECONDARY CLOSURE OF WOUND  DEHISCENCE;  Surgeon: Erle Crocker, MD;  Location: [place];  Service: Orthopedics;  Laterality: Left;   IR FLUORO GUIDE PORT INSERTION RIGHT  02/24/2017   IR NEPHROSTOMY PLACEMENT RIGHT  11/17/2017   IR PATIENT EVAL TECH 0-60 MINS  03/11/2017   IR REMOVAL TUN ACCESS W/ PORT W/O FL MOD SED  04/20/2018   IR US GUIDE VASC ACCESS RIGHT  02/24/2017   KNEE ARTHROSCOPY     bilateral   LAPAROSCOPIC GASTRIC BANDING  07/10/2010   LAPAROSCOPIC GASTRIC BANDING     Laparoscopic adjustable banding APS System with posterior hiatal hernia, 2 suture.   LAPAROTOMY     for ruptured ovary and ovarian artery    NEPHROLITHOTOMY Right 11/17/2017   Procedure: NEPHROLITHOTOMY PERCUTANEOUS;  Surgeon: Karsten Ro,  Elta Guadeloupe, MD;  Location: [facility];  Service: Urology;  Laterality: Right;   PACEMAKER INSERTION  03/10/2013   MDT dual chamber PPM   POCKET REVISION N/A 12/08/2013   Procedure: POCKET REVISION;  Surgeon: Deboraha Sprang, MD;  Location: [facility] CATH LAB;  Service: Cardiovascular;  Laterality: N/A;   PORTA CATH INSERTION     REVERSE SHOULDER ARTHROPLASTY Right 05/14/2018   Procedure: RIGHT REVERSE SHOULDER ARTHROPLASTY;  Surgeon: Tania Ade, MD;  Location: [place];  Service: Orthopedics;  Laterality: Right;   REVERSE SHOULDER REPLACEMENT Right 05/14/2018   RIGHT HEART CATH N/A 07/21/2019   Procedure: RIGHT HEART CATH;  Surgeon: Larey Dresser, MD;  Location: [place] CV LAB;  Service: Cardiovascular;  Laterality: N/A;   TEE WITH CARDIOVERSION  09/22/2017   TEE WITHOUT CARDIOVERSION N/A 10/03/2017   Procedure: TRANSESOPHAGEAL ECHOCARDIOGRAM (TEE);  Surgeon: Sanda Klein, MD;  Location: [facility] ENDOSCOPY;  Service: Cardiovascular;  Laterality: N/A;   TEE WITHOUT CARDIOVERSION N/A 08/04/2019   Procedure: TRANSESOPHAGEAL ECHOCARDIOGRAM (TEE);  Surgeon: Larey Dresser, MD;  Location: [facility] ENDOSCOPY;  Service: Cardiovascular;  Laterality: N/A;   TEE WITHOUT CARDIOVERSION N/A 12/10/2019   Procedure: TRANSESOPHAGEAL  ECHOCARDIOGRAM (TEE);  Surgeon: Buford Dresser, MD;  Location: [facility] ENDOSCOPY;  Service: Cardiovascular;  Laterality: N/A;   TEE WITHOUT CARDIOVERSION N/A 03/09/2021   Procedure: TRANSESOPHAGEAL ECHOCARDIOGRAM (TEE);  Surgeon: Pixie Casino, MD;  Location: [place];  Service: Cardiovascular;  Laterality: N/A;   THYROIDECTOMY  1998   Dr Margot Chimes   TONSILLECTOMY     TOTAL KNEE ARTHROPLASTY  04/13/2012   Procedure: TOTAL KNEE ARTHROPLASTY;  Surgeon: Rudean Haskell, MD;  Location: [place];  Service: Orthopedics;  Laterality: Right;   VIDEO BRONCHOSCOPY WITH ENDOBRONCHIAL ULTRASOUND N/A 02/07/2017   Procedure: VIDEO BRONCHOSCOPY WITH ENDOBRONCHIAL ULTRASOUND;  Surgeon: Marshell Garfinkel, MD;  Location: [place];  Service: Pulmonary;  Laterality: N/A;    There were no vitals filed for this visit.   Subjective Assessment - 08/31/21 1040     Currently in Pain? Yes    Pain Score 3     Pain Location Back    Pain Orientation Left    Pain Descriptors / Indicators Sore    Pain Type Acute pain    Pain Onset In the past 7 days    Pain Frequency --   my normal pain   Aggravating Factors  certain movements    Pain Relieving Factors unsure              GMC and strengthening with sit > stand 2x10 with 1.5 kg ball, incorporating reaching overhead to simulate homemaking tasks.  During 2nd set of 10, challenged pt to attempt lower to near sitting position but then return to standing.  Pt with LOB backwards x5 resulting in sitting on mat, but able to demonstrate increased BLE strength and control remaining 5 attempts.  2 sets of 10 PNF pattern diagonals in standing with 1.5 kg ball.  Pt demonstrating mild posterior lean when reaching up. Cues provided for increased postural control, with pt able to correct.    Theraband exercises with focus on upright postural control, shoulder flexion, and scapular retraction.  Pt completed 2 sets of each exercise requiring mod multimodal cues for proper technique to  increase ROM. Provided pt with updated HEP and encouraged continued use of HEP at home for improved posture, strength, and cardiovascular endurance.  OT Education - 08/31/21 1047     Education Details Educated on use of HEP at home for strength, endurance, and carryover of therapy sessions. Educated on energy conservation strategies for increased safety with ADLs and IADLs due to decreased activity tolerance/balance.    Person(s) Educated Patient    Methods Explanation;Handout    Comprehension Verbalized understanding;Need further instruction              OT Short Term Goals - 08/29/21 1009       OT SHORT TERM GOAL #1   Title Pt will verbalize understanding of 3 compensatory strategies for low vision.   requires min cues to recall modifications   Time 4    Period Weeks    Status Partially Met    Target Date 08/15/21      OT SHORT TERM GOAL #2   Title Pt will improve hand strength and endurance to improve handwriting legiblity to 90%    Time 4    Period Weeks    Status Partially Met      OT SHORT TERM GOAL #3   Title Pt will demonstrate improved activity tolerance to complete standing acitivity for 15 mins without seated rest break.    Time 4    Period Weeks    Status Not Met   pt able to stand 13 mins during activity before requring seated rest break     OT SHORT TERM GOAL #4   Title Pt will improve shoulder ROM to 140* in RUE to increase functional reach in to cabinets    Time 4    Period Weeks    Status Achieved               OT Long Term Goals - 08/29/21 1010       OT LONG TERM GOAL #1   Title Pt will be independent in energy conservation strategies.    Time 8    Period Weeks    Status On-going      OT LONG TERM GOAL #2   Title Pt will be independent in modifications for low vision.    Time 8    Period Weeks    Status Achieved      OT LONG TERM GOAL #3   Title Pt wil be able to complete housekeeping task  incorporating reaching outside BOS without reports of dizziness.    Time 8    Period Weeks    Status On-going      OT LONG TERM GOAL #4   Title Pt will be able to complete dual task activity without cues for sequencing and with improved safety awareness.    Time 8    Period Weeks    Status On-going                   Plan - 08/31/21 0844     Clinical Impression Statement Pt continues to demonstrate cognitive deficits impacting her participation and understanding of directions and carryover for home.  Pt requires multimodal cues and repetition for improved technique and carryover.  Pt's cognitive impairments are becoming more prevalent, therefore SLP order obtained.    OT Occupational Profile and History Detailed Assessment- Review of Records and additional review of physical, cognitive, psychosocial history related to current functional performance    Occupational performance deficits (Please refer to evaluation for details): ADL's;IADL's;Leisure;Social Participation    Body Structure / Function / Physical Skills ADL;Balance;Body mechanics;Cardiopulmonary status limiting activity;Coordination;Endurance;FMC;GMC;IADL;Mobility;Pain;ROM;Proprioception;Sensation;Strength;UE functional use;Vision    Psychosocial Skills  Environmental  Adaptations;Routines and Behaviors    Rehab Potential Good    Clinical Decision Making Limited treatment options, no task modification necessary    Comorbidities Affecting Occupational Performance: May have comorbidities impacting occupational performance    Modification or Assistance to Complete Evaluation  No modification of tasks or assist necessary to complete eval    OT Frequency 2x / week    OT Duration 8 weeks    OT Treatment/Interventions Self-care/ADL training;Cryotherapy;Electrical Stimulation;Ultrasound;Moist Heat;Fluidtherapy;Therapeutic exercise;Neuromuscular education;Energy conservation;Manual Therapy;Functional Mobility Training;DME and/or AE  instruction;Passive range of motion;Patient/family education;Visual/perceptual remediation/compensation;Cognitive remediation/compensation;Therapeutic activities;Balance training;Psychosocial skills training;Coping strategies training    Plan handwriting to include passage or grocery list.  Continue gross and fine motor strengtening for handwriting.  Dynamic balance/mobility while addressing visual fields and activity tolerance for IADLs    OT Home Exercise Plan theraband exercises and energy consevation handout [id number]    Consulted and Agree with Plan of Care Patient             Patient will benefit from skilled therapeutic intervention in order to improve the following deficits and impairments:   Body Structure / Function / Physical Skills: ADL, Balance, Body mechanics, Cardiopulmonary status limiting activity, Coordination, Endurance, FMC, GMC, IADL, Mobility, Pain, ROM, Proprioception, Sensation, Strength, UE functional use, Vision   Psychosocial Skills: Environmental  Adaptations, Routines and Behaviors   Visit Diagnosis: Unsteadiness on feet  Muscle weakness (generalized)  Attention and concentration deficit  Other abnormalities of gait and mobility  Blindness, one eye, low vision other eye, unspecified eyes    Problem List Patient Active Problem List   Diagnosis Date Noted   Hypertension    Pressure injury of skin 03/15/2021   Intracranial hemorrhage ([place]) 03/14/2021   Lower extremity edema    Polymicrobial bacterial infection    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    Enterococcus faecalis infection    Sepsis due to Escherichia coli ([place])    Debility 02/13/2021   Infection of wound hematoma 02/05/2021   Physical debility 12/22/2020   Tear of left hamstring 12/22/2020   Labral tear of left hip joint 12/22/2020   Acute blood loss anemia    Orthostatic hypotension 12/17/2020   History of CVA (cerebrovascular accident) 12/17/2020   Fall 12/15/2020   Hip  hematoma, left, initial encounter 12/15/2020   Nondisplaced fracture of coronoid process of left ulna, initial encounter for closed fracture 12/15/2020   Multiple rib fractures 12/15/2020   COVID-19 virus infection 10/12/2020   Constipation 06/22/2020   Change in bowel habits 06/22/2020   Diverticulosis 06/22/2020   Dark stools 06/22/2020   Hemorrhoids 06/22/2020   Pacemaker 01/23/2020   Junctional rhythm 01/23/2020   Palpitations 01/23/2020   ICH (intracerebral hemorrhage) ([place]) 12/02/2019   A-fib ([place]) 12/02/2019   Anemia 12/02/2019   QT prolongation 12/02/2019   Hypothyroidism 12/02/2019   Gait abnormality 07/10/2018   S/P reverse total shoulder arthroplasty, 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   Persistent atrial fibrillation ([place])    Bilateral ureteral calculi 11/17/2017   Atrial fibrillation with RVR ([place]) 09/15/2017   Atrial flutter ([place]) 07/17/2017   Port catheter in place 04/02/2017   Non-Hodgkin lymphoma, unspecified, intrathoracic lymph nodes ([place]) 02/13/2017   Mediastinal mass 02/06/2017   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   Abnormal chest x-ray    Lung mass 02/03/2017   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   OSA (obstructive sleep apnea) 02/03/2015   History of renal calculi 11/16/2014   Renal calculi 11/16/2014   Family history of colon cancer 10/26/2014  Mechanical complication due to cardiac pacemaker pulse generator 11/06/2013   Dyspnea 05/12/2013   Hypothyroidism 04/21/2013   Pleural effusion 04/19/2013   Long term (current) use of anticoagulants 12/20/2010   EPIDERMOID CYST 08/22/2010   Chronic diastolic heart failure ([place]) 08/06/2010   SYNCOPE AND COLLAPSE 07/27/2010   OSTEOARTHRITIS, KNEE, RIGHT 03/15/2010   Class 1 obesity due to excess calories with body mass index (BMI) of 33.0 to 33.9 in adult 12/07/2009   NONSPEC ELEVATION OF LEVELS OF TRANSAMINASE/LDH 09/08/2009   BREAST CANCER, HX OF 07/25/2009   COLONIC POLYPS, HX OF 07/25/2009   TUBULOVILLOUS  ADENOMA, COLON 04/29/2008   HYPERGLYCEMIA, FASTING 04/29/2008   HYPERLIPIDEMIA 06/22/2007   CARCINOMA, THYROID GLAND, HX OF 06/22/2007    Simonne Come, OT 08/31/2021, 10:50 AM  [facility] [address] [place], [place], [address] Phone: [phone]   Fax:  [phone]  Name: KEDRA MCGLADE MRN: [id number] Date of Birth: [birth year]

## 2021-08-31 NOTE — Patient Instructions (Signed)
Access Code: [email or web address] URL: [email or web address] Date: 08/31/2021 Prepared by: [facility]  Exercises Seated Shoulder Horizontal Abduction with Resistance - 2 x daily - 7 x weekly - 2 sets - 10 reps Seated Diagonal Reach with Resistance - 2 x daily - 7 x weekly - 2 sets - 10 reps Seated Shoulder Flexion with Resistance - 2 x daily - 7 x weekly - 2 sets - 10 reps Seated Overhead Press with Resistance - 2 x daily - 7 x weekly - 2 sets - 10 reps  Patient Education Proofreader

## 2021-08-31 NOTE — Therapy (Signed)
[facility] [address], [place], [place], [address] Phone: [phone]   Fax:  [phone]  Physical Therapy Treatment  Patient Details  Name: Kayla Harrison MRN: [id number] Date of Birth: 03-31-1946 Referring Provider (PT): Garvin Fila, MD   Encounter Date: 08/31/2021   PT End of Session - 08/31/21 0952     Visit Number 11    Number of Visits 17    Date for PT Re-Evaluation 09/12/21    Authorization Type Medicare & MoO    PT Start Time 0939    PT Stop Time 1018    PT Time Calculation (min) 39 min    Equipment Utilized During Treatment Gait belt    Activity Tolerance Patient tolerated treatment well   reports being fatigued at end of session   Behavior During Therapy [facility] for tasks assessed/performed             Past Medical History:  Diagnosis Date   Anemia    Arthritis    osteoarthritis - knees and right shoulder   Blood transfusion without reported diagnosis    Breast cancer ([place])    Dr Margot Chimes, total thyroidectomy- 1999- for cancer   Brucellosis 1964   Chronic bilateral pleural effusions    Colon polyp    Dr Earlean Shawl   Complication of anesthesia    Ketamine produces LSD reaction, bright colored nightmarish experience    Dyslipidemia    Endometriosis    Fibroids    H/O pleural effusion    s/p thoracentesis w 3230m withdrawn   Hepatitis    [place] ?hepatitis    History of dysphagia    due to radiation therapy   History of hiatal hernia    small noted on PET scan   Hypertension    Hypothyroidism    Lung cancer, lower lobe ([place] 01/2017   radiation RX completed 03/04/17; will start chemo 6/27, pt unaware of lung cancer   Morbid obesity ([place]    Status post lap band surgery   Nephrolithiasis    Non Hodgkin's lymphoma ([place]    on chemotherapy   Persistent atrial fibrillation ([place]    a. s/p PVI 2008 b. s/p convergent ablation 21410complicated by bradycardia  requiring pacemaker implant   Personal history of radiation therapy    Presence of permanent cardiac pacemaker    Rotator cuff tear    Right   [place]   SVC syndrome    with lung mass and non hodgkins lymphoma   [place]    Past Surgical History:  Procedure Laterality Date   ABDOMINAL HYSTERECTOMY  1983   afib ablation     a. 2008 PVI b. 2014 convergent ablation   APPENDECTOMY     BONE MARROW BIOPSY  02/21/2017   BREAST LUMPECTOMY Left 2010   [place]    2015- negative   CARDIOVERSION  10/09/2012   Procedure: CARDIOVERSION;  Surgeon: JMinus Breeding MD;  [facility]  Service: Cardiovascular;  Laterality: N/A;   CARDIOVERSION  10/09/2012   Procedure: CARDIOVERSION;  Surgeon: JMinus Breeding MD;  [facility]  Service: Cardiovascular;  Laterality: N/A;  [name] the ok to add pt to the add on , but we must check to find out if the can add pt on at 1400 ( [phone])   CARDIOVERSION N/A 11/20/2012  Procedure: CARDIOVERSION;  Surgeon: Fay Records, MD;  Location: [place];  Service: Cardiovascular;  Laterality: N/A;   CARDIOVERSION N/A 07/18/2017   Procedure: CARDIOVERSION;  Surgeon: Thayer Headings, MD;  Location: [place];  Service: Cardiovascular;  Laterality: N/A;   CARDIOVERSION N/A 10/03/2017   Procedure: CARDIOVERSION;  Surgeon: Sanda Klein, MD;  [facility];  Service: Cardiovascular;  Laterality: N/A;   CARDIOVERSION N/A 01/07/2018   Procedure: CARDIOVERSION;  Surgeon: Acie Fredrickson Wonda Cheng, MD;  Location: [facility] ENDOSCOPY;  Service: Cardiovascular;  Laterality: N/A;   CARDIOVERSION N/A 12/10/2019   Procedure: CARDIOVERSION;  Surgeon: Buford Dresser, MD;  Location: [facility] ENDOSCOPY;  Service: Cardiovascular;  Laterality: N/A;   CARDIOVERSION N/A 03/09/2021   Procedure: CARDIOVERSION;  Surgeon: Pixie Casino, MD;  Location: [place];  Service: Cardiovascular;  Laterality:  N/A;   CHOLECYSTECTOMY     COLONOSCOPY W/ POLYPECTOMY     Dr Earlean Shawl   CYSTOSCOPY N/A 02/06/2015   Procedure: CYSTOSCOPY;  Surgeon: Kathie Rhodes, MD;  [facility]: [facility];  Service: Urology;  Laterality: N/A;   CYSTOSCOPY W/ RETROGRADES Left 11/17/2017   Procedure: CYSTOSCOPY WITH RETROGRADE /PYELOGRAM/;  Surgeon: Kathie Rhodes, MD;  [facility];  Service: Urology;  Laterality: Left;   CYSTOSCOPY WITH RETROGRADE PYELOGRAM, URETEROSCOPY AND STENT PLACEMENT Right 02/06/2015   Procedure: RETROGRADE PYELOGRAM, RIGHT URETEROSCOPY STENT PLACEMENT;  Surgeon: Kathie Rhodes, MD;  [facility];  Service: Urology;  Laterality: Right;   CYSTOSCOPY WITH RETROGRADE PYELOGRAM, URETEROSCOPY AND STENT PLACEMENT Right 03/07/2017   Procedure: CYSTOSCOPY WITH RIGHT RETROGRADE PYELOGRAM,RIGHT  URETEROSCOPYLASER LITHOTRIPSY  AND STENT PLACEMENT AND STONE BASKETRY;  Surgeon: Kathie Rhodes, MD;  Location: [place];  Service: Urology;  Laterality: Right;   EYE SURGERY     cataract surgery   fatty mass removal  1999   pubic area   HOLMIUM LASER APPLICATION N/A 12/31/7351   Procedure: HOLMIUM LASER APPLICATION;  Surgeon: Kathie Rhodes, MD;  [facility]: [facility];  Service: Urology;  Laterality: N/A;   HOLMIUM LASER APPLICATION Right 2/99/2426   Procedure: HOLMIUM LASER APPLICATION;  Surgeon: Kathie Rhodes, MD;  Location: [facility];  Service: Urology;  Laterality: Right;   HOLMIUM LASER APPLICATION Left 8/34/1962   Procedure: HOLMIUM LASER APPLICATION;  Surgeon: Kathie Rhodes, MD;  [facility];  Service: Urology;  Laterality: Left;   I & D EXTREMITY Left 12/19/2020   Procedure: IRRIGATION AND DEBRIDEMENT OF LEFT HIP HEMATOMA WITH APPLICATION OF WOUND VAC;  Surgeon: Erle Crocker, MD;  Location: [place];  Service: Orthopedics;  Laterality: Left;   I & D EXTREMITY Left 02/06/2021   Procedure: IRRIGATION AND DEBRIDEMENT DEEP ABCESS LEFT THIGH, SECONDARY CLOSURE OF WOUND DEHISCENCE;   Surgeon: Erle Crocker, MD;  Location: [place];  Service: Orthopedics;  Laterality: Left;   IR FLUORO GUIDE PORT INSERTION RIGHT  02/24/2017   IR NEPHROSTOMY PLACEMENT RIGHT  11/17/2017   IR PATIENT EVAL TECH 0-60 MINS  03/11/2017   IR REMOVAL TUN ACCESS W/ PORT W/O FL MOD SED  04/20/2018   IR US GUIDE VASC ACCESS RIGHT  02/24/2017   KNEE ARTHROSCOPY     bilateral   LAPAROSCOPIC GASTRIC BANDING  07/10/2010   LAPAROSCOPIC GASTRIC BANDING     Laparoscopic adjustable banding APS System with posterior hiatal hernia, 2 suture.   LAPAROTOMY     for ruptured ovary and ovarian artery    NEPHROLITHOTOMY Right 11/17/2017   Procedure: NEPHROLITHOTOMY PERCUTANEOUS;  Surgeon: Kathie Rhodes, MD;  [facility];  Service: Urology;  Laterality: Right;   PACEMAKER INSERTION  03/10/2013   MDT dual chamber PPM   POCKET REVISION N/A 12/08/2013   Procedure: POCKET REVISION;  Surgeon: Deboraha Sprang, MD;  Location: [facility] LAB;  Service: Cardiovascular;  Laterality: N/A;   PORTA CATH INSERTION     REVERSE SHOULDER ARTHROPLASTY Right 05/14/2018   Procedure: RIGHT REVERSE SHOULDER ARTHROPLASTY;  Surgeon: Tania Ade, MD;  Location: [place];  Service: Orthopedics;  Laterality: Right;   REVERSE SHOULDER REPLACEMENT Right 05/14/2018   RIGHT HEART CATH N/A 07/21/2019   Procedure: RIGHT HEART CATH;  Surgeon: Larey Dresser, MD;  Location: [place] CV LAB;  Service: Cardiovascular;  Laterality: N/A;   TEE WITH CARDIOVERSION  09/22/2017   TEE WITHOUT CARDIOVERSION N/A 10/03/2017   Procedure: TRANSESOPHAGEAL ECHOCARDIOGRAM (TEE);  Surgeon: Sanda Klein, MD;  Location: [facility] ENDOSCOPY;  Service: Cardiovascular;  Laterality: N/A;   TEE WITHOUT CARDIOVERSION N/A 08/04/2019   Procedure: TRANSESOPHAGEAL ECHOCARDIOGRAM (TEE);  Surgeon: Larey Dresser, MD;  Location: [facility] ENDOSCOPY;  Service: Cardiovascular;  Laterality: N/A;   TEE WITHOUT CARDIOVERSION N/A 12/10/2019   Procedure: TRANSESOPHAGEAL ECHOCARDIOGRAM (TEE);   Surgeon: Buford Dresser, MD;  Location: [facility] ENDOSCOPY;  Service: Cardiovascular;  Laterality: N/A;   TEE WITHOUT CARDIOVERSION N/A 03/09/2021   Procedure: TRANSESOPHAGEAL ECHOCARDIOGRAM (TEE);  Surgeon: Pixie Casino, MD;  Location: [place];  Service: Cardiovascular;  Laterality: N/A;   THYROIDECTOMY  1998   Dr Margot Chimes   TONSILLECTOMY     TOTAL KNEE ARTHROPLASTY  04/13/2012   Procedure: TOTAL KNEE ARTHROPLASTY;  Surgeon: Rudean Haskell, MD;  Location: [place];  Service: Orthopedics;  Laterality: Right;   VIDEO BRONCHOSCOPY WITH ENDOBRONCHIAL ULTRASOUND N/A 02/07/2017   Procedure: VIDEO BRONCHOSCOPY WITH ENDOBRONCHIAL ULTRASOUND;  Surgeon: Marshell Garfinkel, MD;  Location: [place];  Service: Pulmonary;  Laterality: N/A;    Vitals:   08/31/21 0941 08/31/21 1003  BP: (!) 132/100 122/80  Pulse: 95 100  SpO2: 94%      Subjective Assessment - 08/31/21 0941     Subjective Have had a pretty good week.    Pertinent History anemia, breast CA s/p surgery, lung CA s/p radiation and chemo, lymphoma on chemo, thyroid CA, HLD, hiatal hernia, HTN, a-fib, pacemaker, R reverse TSA 2019, stroke 2003, SVC syndrome, R TKA 2013    Diagnostic tests 03/15/21 head CT: Similar appearance of left caudothalamic hemorrhage with  intraventricular extension and mild adjacent edema. No new  hemorrhage. Unchanged mild trapping of the left lateral ventricle    Patient Stated Goals be able to get up off the floor    Currently in Pain? Yes    Pain Score 3     Pain Location Back    Pain Orientation Left    Pain Descriptors / Indicators Sore    Pain Onset In the past 7 days    Pain Frequency --   "my normal pain"   Aggravating Factors  certain movements    Pain Relieving Factors unsure                               [facility] Adult PT Treatment/Exercise - 08/31/21 0001       Ambulation/Gait   Ambulation/Gait Yes    Ambulation/Gait Assistance 4: Min guard;5: Supervision    Ambulation  Distance (Feet) 180 Feet   180, then 60 ft all with rollator   Assistive device Rollator    Gait  Pattern Step-to pattern;Decreased step length - left;Decreased step length - right;Right foot flat;Left foot flat;Right flexed knee in stance;Left flexed knee in stance;Poor foot clearance - left;Poor foot clearance - right;Trunk flexed;Step-through pattern    Ambulation Surface Level;Indoor    Gait Comments Attempted 3 minutes of gait, and pt requests to stop after 2 minutes.      Knee/Hip Exercises: Seated   Sit to Sand without UE support;2 sets   from mat; cues for glut/quad activation            Reviewed HEP exercises Added 08/28/2021:  Pt reports performing exercises on days she is not at therapy.    Side Stepping with Counter Support - 1 x daily - 5 x weekly - 2 sets - 10 reps  -BUE support x 10 reps, then 1 UE support with PT min guard 10 reps  -educated pt to continue to perform at home with BUE support Standing Knee Flexion - 1 x daily - 5 x weekly - 2 sets - 10 reps    Balance Exercises - 08/31/21 0001       Balance Exercises: Standing   Other Standing Exercises Side step together at counter, R and L, 10 reps x 2 sets                  PT Short Term Goals - 07/27/21 1011       PT SHORT TERM GOAL #1   Title Patient to be independent with initial HEP.    Time 3    Period Weeks    Status Achieved    Target Date 08/08/21               PT Long Term Goals - 07/25/21 1224       PT LONG TERM GOAL #1   Title Patient to be independent with advanced HEP.    Time 8    Period Weeks    Status On-going      PT LONG TERM GOAL #2   Title Patient to demonstrate B LE strength >/=4+/5.    Time 8    Period Weeks    Status On-going      PT LONG TERM GOAL #3   Title Patient to score at least 46/56 on Berg in order to decrease risk of falls.    Time 8    Period Weeks    Status On-going      PT LONG TERM GOAL #4   Title Patient to complete TUG in <14 sec with  LRAD in order to decrease risk of falls.    Time 8    Period Weeks    Status On-going      PT LONG TERM GOAL #5   Title Patient to demonstrate 5xSTS test in <15 sec in order to decrease risk of falls.    Time 8    Period Weeks    Status On-going      PT LONG TERM GOAL #6   Title Patient to demonstrate safe floor transfer with mod I.    Time 8    Period Weeks    Status On-going                   Plan - 08/31/21 1015     Clinical Impression Statement Skilled PT session today focused on standing balance, functional strength, and gait activities with rollator.  Pt reports fatigue and lightheadedness today, with trouble sleeping the past few nights.  With standing exercises,  worked to decrease UE support, but pt is better balanced and more stable wtih BUE support at counter.  She requires several rest breaks during gait activities today.    Personal Factors and Comorbidities Age;Fitness;Comorbidity 3+;Transportation;Time since onset of injury/illness/exacerbation;Past/Current Experience    Comorbidities anemia, breast CA s/p surgery, lung CA s/p radiation and chemo, lymphoma on chemo, thyroid CA, HLD, hiatal hernia, HTN, a-fib, pacemaker, R reverse TSA 2019, stroke 2003, SVC syndrome, R TKA 2013    Examination-Activity Limitations Bathing;Locomotion Level;Transfers;Bed Mobility;Reach Overhead;Bend;Carry;Squat;Dressing;Stairs;Stand;Hygiene/Grooming;Lift;Toileting    Examination-Participation Restrictions Laundry;Shop;Community Activity;Cleaning;Church;Meal Prep    Stability/Clinical Decision Making Stable/Uncomplicated    Rehab Potential Good    PT Frequency 2x / week    PT Duration 8 weeks    PT Treatment/Interventions ADLs/Self Care Home Management;Cryotherapy;Electrical Stimulation;DME Instruction;Moist Heat;Gait training;Stair training;Functional mobility training;Therapeutic activities;Therapeutic exercise;Balance training;Neuromuscular re-education;Manual  techniques;Patient/family education;Passive range of motion;Dry needling;Energy conservation;Vestibular;Vasopneumatic Device;Taping    PT Next Visit Plan balance training with SLS activities; progress LE stretching and strengthening to improve posture and gait deviations; continue gait training with SPC.  Pt having questions about progress with PT; should we discuss continueing gait with RW due to fall risk (so she may do more walking?) and should we ask if family is available for any training/education to optimize carryover outside of therapy sessions?    Consulted and Agree with Plan of Care Patient             Patient will benefit from skilled therapeutic intervention in order to improve the following deficits and impairments:  Abnormal gait, Decreased range of motion, Difficulty walking, Increased fascial restricitons, Increased muscle spasms, Decreased safety awareness, Decreased endurance, Cardiopulmonary status limiting activity, Decreased activity tolerance, Decreased balance, Impaired flexibility, Improper body mechanics, Postural dysfunction, Decreased strength  Visit Diagnosis: Muscle weakness (generalized)  Unsteadiness on feet  Other abnormalities of gait and mobility     Problem List Patient Active Problem List   Diagnosis Date Noted   Hypertension    Pressure injury of skin 03/15/2021   Intracranial hemorrhage (HCC) 03/14/2021   Lower extremity edema    Polymicrobial bacterial infection    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    Enterococcus faecalis infection    Sepsis due to Escherichia coli ([place])    Debility 02/13/2021   Infection of wound hematoma 02/05/2021   Physical debility 12/22/2020   Tear of left hamstring 12/22/2020   Labral tear of left hip joint 12/22/2020   Acute blood loss anemia    Orthostatic hypotension 12/17/2020   History of CVA (cerebrovascular accident) 12/17/2020   Fall 12/15/2020   Hip hematoma, left, initial encounter 12/15/2020    Nondisplaced fracture of coronoid process of left ulna, initial encounter for closed fracture 12/15/2020   Multiple rib fractures 12/15/2020   COVID-19 virus infection 10/12/2020   Constipation 06/22/2020   Change in bowel habits 06/22/2020   Diverticulosis 06/22/2020   Dark stools 06/22/2020   Hemorrhoids 06/22/2020   Pacemaker 01/23/2020   Junctional rhythm 01/23/2020   Palpitations 01/23/2020   ICH (intracerebral hemorrhage) ([place]) 12/02/2019   A-fib ([place]) 12/02/2019   Anemia 12/02/2019   QT prolongation 12/02/2019   Hypothyroidism 12/02/2019   Gait abnormality 07/10/2018   S/P reverse total shoulder arthroplasty, 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   Persistent atrial fibrillation ([place])    Bilateral ureteral calculi 11/17/2017   Atrial fibrillation with RVR ([place]) 09/15/2017   Atrial flutter ([place]) 07/17/2017   Port catheter in place 04/02/2017   Non-Hodgkin lymphoma, unspecified, intrathoracic lymph nodes ([place]) 02/13/2017  Mediastinal mass 02/06/2017   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[place]) 02/06/2017   Abnormal chest x-ray    Lung mass 02/03/2017   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   OSA (obstructive sleep apnea) 02/03/2015   History of renal calculi 11/16/2014   Renal calculi 11/16/2014   Family history of colon cancer 10/26/2014   Mechanical complication due to cardiac pacemaker pulse generator 11/06/2013   Dyspnea 05/12/2013   Hypothyroidism 04/21/2013   Pleural effusion 04/19/2013   Long term (current) use of anticoagulants 12/20/2010   EPIDERMOID CYST 08/22/2010   Chronic diastolic heart failure ([place]) 08/06/2010   SYNCOPE AND COLLAPSE 07/27/2010   OSTEOARTHRITIS, KNEE, RIGHT 03/15/2010   Class 1 obesity due to excess calories with body mass index (BMI) of 33.0 to 33.9 in adult 12/07/2009   NONSPEC ELEVATION OF LEVELS OF TRANSAMINASE/LDH 09/08/2009   BREAST CANCER, HX OF 07/25/2009   COLONIC POLYPS, HX OF 07/25/2009   TUBULOVILLOUS ADENOMA, COLON 04/29/2008   HYPERGLYCEMIA,  FASTING 04/29/2008   HYPERLIPIDEMIA 06/22/2007   CARCINOMA, THYROID GLAND, HX OF 06/22/2007    Galdino Hinchman W., PT 08/31/2021, 10:25 AM  [facility] [address] [place], [place], [address] Phone: [phone]   Fax:  [phone]  Name: MIKYAH ALAMO MRN: [id number] Date of Birth: 05-08-1946

## 2021-09-03 ENCOUNTER — Ambulatory Visit (INDEPENDENT_AMBULATORY_CARE_PROVIDER_SITE_OTHER): Payer: Medicare Other

## 2021-09-03 DIAGNOSIS — J432 Centrilobular emphysema: Secondary | ICD-10-CM | POA: Diagnosis not present

## 2021-09-03 DIAGNOSIS — I4819 Other persistent atrial fibrillation: Secondary | ICD-10-CM | POA: Diagnosis not present

## 2021-09-03 DIAGNOSIS — N1831 Chronic kidney disease, stage 3a: Secondary | ICD-10-CM | POA: Diagnosis not present

## 2021-09-03 DIAGNOSIS — R410 Disorientation, unspecified: Secondary | ICD-10-CM | POA: Diagnosis not present

## 2021-09-03 DIAGNOSIS — Z7901 Long term (current) use of anticoagulants: Secondary | ICD-10-CM | POA: Diagnosis not present

## 2021-09-03 DIAGNOSIS — G4733 Obstructive sleep apnea (adult) (pediatric): Secondary | ICD-10-CM | POA: Diagnosis not present

## 2021-09-03 DIAGNOSIS — E890 Postprocedural hypothyroidism: Secondary | ICD-10-CM | POA: Diagnosis not present

## 2021-09-03 DIAGNOSIS — I700 Atherosclerosis of aorta: Secondary | ICD-10-CM | POA: Diagnosis not present

## 2021-09-03 DIAGNOSIS — I480 Paroxysmal atrial fibrillation: Secondary | ICD-10-CM | POA: Diagnosis not present

## 2021-09-03 DIAGNOSIS — I679 Cerebrovascular disease, unspecified: Secondary | ICD-10-CM | POA: Diagnosis not present

## 2021-09-03 DIAGNOSIS — D509 Iron deficiency anemia, unspecified: Secondary | ICD-10-CM | POA: Diagnosis not present

## 2021-09-03 DIAGNOSIS — I2510 Atherosclerotic heart disease of native coronary artery without angina pectoris: Secondary | ICD-10-CM | POA: Diagnosis not present

## 2021-09-03 DIAGNOSIS — E785 Hyperlipidemia, unspecified: Secondary | ICD-10-CM | POA: Diagnosis not present

## 2021-09-04 ENCOUNTER — Encounter: Payer: Medicare Other | Admitting: Occupational Therapy

## 2021-09-04 ENCOUNTER — Telehealth: Payer: Self-pay

## 2021-09-04 ENCOUNTER — Ambulatory Visit: Payer: Medicare Other | Admitting: Physical Therapy

## 2021-09-04 ENCOUNTER — Ambulatory Visit: Payer: Medicare Other

## 2021-09-04 LAB — CUP PACEART REMOTE DEVICE CHECK
Battery Remaining Longevity: 21 mo
Battery Voltage: 2.92 V
Brady Statistic RA Percent Paced: 0.41 %
Brady Statistic RV Percent Paced: 0.69 %
Date Time Interrogation Session: 20221212150525
Implantable Lead Implant Date: 20140618
Implantable Lead Implant Date: 20140618
Implantable Lead Location: 753859
Implantable Lead Location: 753860
Implantable Pulse Generator Implant Date: 20140618
Lead Channel Impedance Value: 1026 Ohm
Lead Channel Impedance Value: 323 Ohm
Lead Channel Impedance Value: 399 Ohm
Lead Channel Impedance Value: 931 Ohm
Lead Channel Pacing Threshold Amplitude: 1.125 V
Lead Channel Pacing Threshold Amplitude: 1.25 V
Lead Channel Pacing Threshold Pulse Width: 0.4 ms
Lead Channel Pacing Threshold Pulse Width: 0.4 ms
Lead Channel Sensing Intrinsic Amplitude: 1.125 mV
Lead Channel Sensing Intrinsic Amplitude: 1.125 mV
Lead Channel Sensing Intrinsic Amplitude: 12.375 mV
Lead Channel Sensing Intrinsic Amplitude: 12.375 mV
Lead Channel Setting Pacing Amplitude: 2.5 V
Lead Channel Setting Pacing Amplitude: 2.5 V
Lead Channel Setting Pacing Pulse Width: 0.4 ms
Lead Channel Setting Sensing Sensitivity: 0.9 mV

## 2021-09-04 NOTE — Telephone Encounter (Signed)
LMOVM for patient to send a manual transmission with her home remote monitor.

## 2021-09-04 NOTE — Telephone Encounter (Signed)
The patient agreed to send a transmission in the next few minutes.

## 2021-09-04 NOTE — Telephone Encounter (Signed)
Patient was returning call 

## 2021-09-05 NOTE — Telephone Encounter (Signed)
Patient returning call.

## 2021-09-05 NOTE — Telephone Encounter (Signed)
No answer/ voicemail full

## 2021-09-05 NOTE — Telephone Encounter (Signed)
No answer voicemail is full. 

## 2021-09-07 ENCOUNTER — Ambulatory Visit: Payer: Medicare Other | Admitting: Physical Therapy

## 2021-09-07 ENCOUNTER — Ambulatory Visit: Payer: Medicare Other | Admitting: Occupational Therapy

## 2021-09-10 ENCOUNTER — Ambulatory Visit: Payer: Medicare Other | Admitting: Occupational Therapy

## 2021-09-10 ENCOUNTER — Ambulatory Visit: Payer: Medicare Other

## 2021-09-10 ENCOUNTER — Encounter: Payer: Medicare Other | Admitting: Occupational Therapy

## 2021-09-10 ENCOUNTER — Encounter: Payer: Self-pay | Admitting: Occupational Therapy

## 2021-09-10 ENCOUNTER — Ambulatory Visit: Payer: Medicare Other | Admitting: Physical Therapy

## 2021-09-10 ENCOUNTER — Other Ambulatory Visit: Payer: Self-pay

## 2021-09-10 DIAGNOSIS — R2681 Unsteadiness on feet: Secondary | ICD-10-CM

## 2021-09-10 DIAGNOSIS — H5410 Blindness, one eye, low vision other eye, unspecified eyes: Secondary | ICD-10-CM | POA: Diagnosis not present

## 2021-09-10 DIAGNOSIS — R2689 Other abnormalities of gait and mobility: Secondary | ICD-10-CM | POA: Diagnosis not present

## 2021-09-10 DIAGNOSIS — R41840 Attention and concentration deficit: Secondary | ICD-10-CM | POA: Diagnosis not present

## 2021-09-10 DIAGNOSIS — M6281 Muscle weakness (generalized): Secondary | ICD-10-CM

## 2021-09-10 DIAGNOSIS — R41841 Cognitive communication deficit: Secondary | ICD-10-CM

## 2021-09-10 NOTE — Therapy (Signed)
[facility], [place], [place], [address] Phone: [phone]   Fax:  [phone]  Occupational Therapy Treatment  Patient Details  Name: Kayla Harrison MRN: [id number] Date of Birth: [birth year] Referring Provider (OT): Leonie Man   Encounter Date: 09/10/2021   OT End of Session - 09/10/21 0913     Visit Number 12    Number of Visits 17    Date for OT Re-Evaluation 09/12/21    Authorization Type Medicare A and B    Authorization - Visit Number 2    Authorization - Number of Visits 10    Progress Note Due on Visit 10    OT Start Time 0905    OT Stop Time [phone]    OT Time Calculation (min) 33 min    Activity Tolerance Patient limited by fatigue    Behavior During Therapy [facility] for tasks assessed/performed             Past Medical History:  Diagnosis Date   Anemia    Arthritis    osteoarthritis - knees and right shoulder   Blood transfusion without reported diagnosis    Breast cancer ([place])    Dr Margot Chimes, total thyroidectomy- 1999- for cancer   Brucellosis 1964   Chronic bilateral pleural effusions    Colon polyp    Dr Earlean Shawl   Complication of anesthesia    Ketamine produces LSD reaction, bright colored nightmarish experience    Dyslipidemia    Endometriosis    Fibroids    H/O pleural effusion    s/p thoracentesis w 3224ml withdrawn   Hepatitis    [place] ?hepatitis    History of dysphagia    due to radiation therapy   History of hiatal hernia    small noted on PET scan   Hypertension    Hypothyroidism    Lung cancer, lower lobe ([place]) 01/2017   radiation RX completed 03/04/17; will start chemo 6/27, pt unaware of lung cancer   Morbid obesity ([place])    Status post lap band surgery   Nephrolithiasis    Non Hodgkin's lymphoma ([place])    on chemotherapy   Persistent atrial fibrillation ([place])    a. s/p PVI 2008 b. s/p convergent ablation 3662 complicated by bradycardia  requiring pacemaker implant   Personal history of radiation therapy    Presence of permanent cardiac pacemaker    Rotator cuff tear    Right   Stroke ([place])    2003- [place] x2   SVC syndrome    with lung mass and non hodgkins lymphoma   Thyroid cancer ([place]) 2000    Past Surgical History:  Procedure Laterality Date   ABDOMINAL HYSTERECTOMY  1983   afib ablation     a. 2008 PVI b. 2014 convergent ablation   APPENDECTOMY     BONE MARROW BIOPSY  02/21/2017   BREAST LUMPECTOMY Left 2010   [place]     2015- negative   CARDIOVERSION  10/09/2012   Procedure: CARDIOVERSION;  Surgeon: Minus Breeding, MD;  Location: [place];  Service: Cardiovascular;  Laterality: N/A;   CARDIOVERSION  10/09/2012   Procedure: CARDIOVERSION;  Surgeon: Minus Breeding, MD;  Location: [facility] ENDOSCOPY;  Service: Cardiovascular;  Laterality: N/A;  [name] gave the ok to add pt to the add on , but we must check to find out if the can add pt on at [phone] ( [phone])  CARDIOVERSION N/A 11/20/2012   Procedure: CARDIOVERSION;  Surgeon: Fay Records, MD;  Location: [place];  Service: Cardiovascular;  Laterality: N/A;   CARDIOVERSION N/A 07/18/2017   Procedure: CARDIOVERSION;  Surgeon: Thayer Headings, MD;  Location: [place];  Service: Cardiovascular;  Laterality: N/A;   CARDIOVERSION N/A 10/03/2017   Procedure: CARDIOVERSION;  Surgeon: Sanda Klein, MD;  Location: [facility] ENDOSCOPY;  Service: Cardiovascular;  Laterality: N/A;   CARDIOVERSION N/A 01/07/2018   Procedure: CARDIOVERSION;  Surgeon: Acie Fredrickson Wonda Cheng, MD;  Location: [facility] ENDOSCOPY;  Service: Cardiovascular;  Laterality: N/A;   CARDIOVERSION N/A 12/10/2019   Procedure: CARDIOVERSION;  Surgeon: Buford Dresser, MD;  Location: [facility] ENDOSCOPY;  Service: Cardiovascular;  Laterality: N/A;   CARDIOVERSION N/A 03/09/2021   Procedure: CARDIOVERSION;  Surgeon: Pixie Casino, MD;  Location: [place];  Service: Cardiovascular;  Laterality:  N/A;   CHOLECYSTECTOMY     COLONOSCOPY W/ POLYPECTOMY     Dr Earlean Shawl   CYSTOSCOPY N/A 02/06/2015   Procedure: CYSTOSCOPY;  Surgeon: Kathie Rhodes, MD;  Location: [facility];  Service: Urology;  Laterality: N/A;   CYSTOSCOPY W/ RETROGRADES Left 11/17/2017   Procedure: CYSTOSCOPY WITH RETROGRADE /PYELOGRAM/;  Surgeon: Kathie Rhodes, MD;  Location: [facility];  Service: Urology;  Laterality: Left;   CYSTOSCOPY WITH RETROGRADE PYELOGRAM, URETEROSCOPY AND STENT PLACEMENT Right 02/06/2015   Procedure: RETROGRADE PYELOGRAM, RIGHT URETEROSCOPY STENT PLACEMENT;  Surgeon: Kathie Rhodes, MD;  [facility]: [facility];  Service: Urology;  Laterality: Right;   CYSTOSCOPY WITH RETROGRADE PYELOGRAM, URETEROSCOPY AND STENT PLACEMENT Right 03/07/2017   Procedure: CYSTOSCOPY WITH RIGHT RETROGRADE PYELOGRAM,RIGHT  URETEROSCOPYLASER LITHOTRIPSY  AND STENT PLACEMENT AND STONE BASKETRY;  Surgeon: Kathie Rhodes, MD;  Location: [place];  Service: Urology;  Laterality: Right;   EYE SURGERY     cataract surgery   fatty mass removal  1999   pubic area   HOLMIUM LASER APPLICATION N/A 05/03/9146   Procedure: HOLMIUM LASER APPLICATION;  Surgeon: Kathie Rhodes, MD;  [facility]: [facility];  Service: Urology;  Laterality: N/A;   HOLMIUM LASER APPLICATION Right 05/21/5620   Procedure: HOLMIUM LASER APPLICATION;  Surgeon: Kathie Rhodes, MD;  Location: [facility];  Service: Urology;  Laterality: Right;   HOLMIUM LASER APPLICATION Left 11/28/6576   Procedure: HOLMIUM LASER APPLICATION;  Surgeon: Kathie Rhodes, MD;  [facility];  Service: Urology;  Laterality: Left;   I & D EXTREMITY Left 12/19/2020   Procedure: IRRIGATION AND DEBRIDEMENT OF LEFT HIP HEMATOMA WITH APPLICATION OF WOUND VAC;  Surgeon: Erle Crocker, MD;  Location: [place];  Service: Orthopedics;  Laterality: Left;   I & D EXTREMITY Left 02/06/2021   Procedure: IRRIGATION AND DEBRIDEMENT DEEP ABCESS LEFT THIGH, SECONDARY CLOSURE OF WOUND DEHISCENCE;   Surgeon: Erle Crocker, MD;  Location: [place];  Service: Orthopedics;  Laterality: Left;   IR FLUORO GUIDE PORT INSERTION RIGHT  02/24/2017   IR NEPHROSTOMY PLACEMENT RIGHT  11/17/2017   IR PATIENT EVAL TECH 0-60 MINS  03/11/2017   IR REMOVAL TUN ACCESS W/ PORT W/O FL MOD SED  04/20/2018   IR US GUIDE VASC ACCESS RIGHT  02/24/2017   KNEE ARTHROSCOPY     bilateral   LAPAROSCOPIC GASTRIC BANDING  07/10/2010   LAPAROSCOPIC GASTRIC BANDING     Laparoscopic adjustable banding APS System with posterior hiatal hernia, 2 suture.   LAPAROTOMY     for ruptured ovary and ovarian artery    NEPHROLITHOTOMY Right 11/17/2017   Procedure: NEPHROLITHOTOMY PERCUTANEOUS;  Surgeon: Karsten Ro,  Elta Guadeloupe, MD;  [facility];  Service: Urology;  Laterality: Right;   PACEMAKER INSERTION  03/10/2013   MDT dual chamber PPM   POCKET REVISION N/A 12/08/2013   Procedure: POCKET REVISION;  Surgeon: Deboraha Sprang, MD;  Location: [facility] CATH LAB;  Service: Cardiovascular;  Laterality: N/A;   PORTA CATH INSERTION     REVERSE SHOULDER ARTHROPLASTY Right 05/14/2018   Procedure: RIGHT REVERSE SHOULDER ARTHROPLASTY;  Surgeon: Tania Ade, MD;  Location: [place];  Service: Orthopedics;  Laterality: Right;   REVERSE SHOULDER REPLACEMENT Right 05/14/2018   RIGHT HEART CATH N/A 07/21/2019   Procedure: RIGHT HEART CATH;  Surgeon: Larey Dresser, MD;  Location: [place] CV LAB;  Service: Cardiovascular;  Laterality: N/A;   TEE WITH CARDIOVERSION  09/22/2017   TEE WITHOUT CARDIOVERSION N/A 10/03/2017   Procedure: TRANSESOPHAGEAL ECHOCARDIOGRAM (TEE);  Surgeon: Sanda Klein, MD;  Location[facility]OSCOPY;  Service: Cardiovascular;  Laterality: N/A;   TEE WITHOUT CARDIOVERSION N/A 08/04/2019   Procedure: TRANSESOPHAGEAL ECHOCARDIOGRAM (TEE);  Surgeon: Larey Dresser, MD;  Location: [facility] ENDOSCOPY;  Service: Cardiovascular;  Laterality: N/A;   TEE WITHOUT CARDIOVERSION N/A 12/10/2019   Procedure: TRANSESOPHAGEAL ECHOCARDIOGRAM (TEE);   Surgeon: Buford Dresser, MD;  Location: [facility] ENDOSCOPY;  Service: Cardiovascular;  Laterality: N/A;   TEE WITHOUT CARDIOVERSION N/A 03/09/2021   Procedure: TRANSESOPHAGEAL ECHOCARDIOGRAM (TEE);  Surgeon: Pixie Casino, MD;  Location: [place];  Service: Cardiovascular;  Laterality: N/A;   THYROIDECTOMY  1998   Dr Margot Chimes   TONSILLECTOMY     TOTAL KNEE ARTHROPLASTY  04/13/2012   Procedure: TOTAL KNEE ARTHROPLASTY;  Surgeon: Rudean Haskell, MD;  Location: [place];  Service: Orthopedics;  Laterality: Right;   VIDEO BRONCHOSCOPY WITH ENDOBRONCHIAL ULTRASOUND N/A 02/07/2017   Procedure: VIDEO BRONCHOSCOPY WITH ENDOBRONCHIAL ULTRASOUND;  Surgeon: Marshell Garfinkel, MD;  Location: [place];  Service: Pulmonary;  Laterality: N/A;    There were no vitals filed for this visit.   Subjective Assessment - 09/10/21 0908     Subjective  Pt reports that her car would not start this morning and still feeling "rotten" with being so sick.    Pertinent History h/o hyperlipidemia, atrial fibrillation with pacemaker on Eliquis, lymphoma, confusion, subcortical hemorrhage on the left with intraventricular extension    Currently in Pain? No/denies    Pain Onset In the past 7 days               Dynamic standing balance with focus on trunk rotation to obtain puzzle pieces to R and L from mat behind self.  Pt completed trunk rotation with no reports of dizziness or lightheadedness.  Pt only able to stand 2-3 mins before requiring prolonged seated rest break.  Pt reports "too fatigued" to return to any standing tasks this session.  Completed 12 piece puzzle in sitting with more than reasonable time secondary to decreased sequencing, processing, and problem solving.  Pt utilized picture, however still with increased difficulty completing puzzle.  Engaged in discussion regarding multiple medical concerns and limited progress towards goals due to decreased endurance.  Encouraged pt to contact cardiologist due to  reports of "constant heart problems".  Pt appeared to brush off most recommendations, despite pt concerns as well.                      OT Short Term Goals - 08/29/21 1009       OT SHORT TERM GOAL #1   Title Pt will verbalize understanding of  3 compensatory strategies for low vision.   requires min cues to recall modifications   Time 4    Period Weeks    Status Partially Met    Target Date 08/15/21      OT SHORT TERM GOAL #2   Title Pt will improve hand strength and endurance to improve handwriting legiblity to 90%    Time 4    Period Weeks    Status Partially Met      OT SHORT TERM GOAL #3   Title Pt will demonstrate improved activity tolerance to complete standing acitivity for 15 mins without seated rest break.    Time 4    Period Weeks    Status Not Met   pt able to stand 13 mins during activity before requring seated rest break     OT SHORT TERM GOAL #4   Title Pt will improve shoulder ROM to 140* in RUE to increase functional reach in to cabinets    Time 4    Period Weeks    Status Achieved               OT Long Term Goals - 08/29/21 1010       OT LONG TERM GOAL #1   Title Pt will be independent in energy conservation strategies.    Time 8    Period Weeks    Status On-going      OT LONG TERM GOAL #2   Title Pt will be independent in modifications for low vision.    Time 8    Period Weeks    Status Achieved      OT LONG TERM GOAL #3   Title Pt wil be able to complete housekeeping task incorporating reaching outside BOS without reports of dizziness.    Time 8    Period Weeks    Status On-going      OT LONG TERM GOAL #4   Title Pt will be able to complete dual task activity without cues for sequencing and with improved safety awareness.    Time 8    Period Weeks    Status On-going                   Plan - 09/10/21 0914     Clinical Impression Statement Pt extremely limited by fatigue and overall drowsiness during  session.  Pt with reports of "constant heart problems" but denying any need for intervention despite encouragement to discuss with cardiologist.  Pt generally seems to be making minimal progress due to ongoing battles with various illnesses, impacting pt progress towards goals.  Pt frequently fatigued and reports doing thing with her hands is getting worse.  Pt may benefit from additional medical management due to limited participation in therapy sessions.    OT Occupational Profile and History Detailed Assessment- Review of Records and additional review of physical, cognitive, psychosocial history related to current functional performance    Occupational performance deficits (Please refer to evaluation for details): ADL's;IADL's;Leisure;Social Participation    Body Structure / Function / Physical Skills ADL;Balance;Body mechanics;Cardiopulmonary status limiting activity;Coordination;Endurance;FMC;GMC;IADL;Mobility;Pain;ROM;Proprioception;Sensation;Strength;UE functional use;Vision    Psychosocial Skills Environmental  Adaptations;Routines and Behaviors    Rehab Potential Good    Clinical Decision Making Limited treatment options, no task modification necessary    Comorbidities Affecting Occupational Performance: May have comorbidities impacting occupational performance    Modification or Assistance to Complete Evaluation  No modification of tasks or assist necessary to complete eval    OT Frequency  2x / week    OT Duration 8 weeks    OT Treatment/Interventions Self-care/ADL training;Cryotherapy;Electrical Stimulation;Ultrasound;Moist Heat;Fluidtherapy;Therapeutic exercise;Neuromuscular education;Energy conservation;Manual Therapy;Functional Mobility Training;DME and/or AE instruction;Passive range of motion;Patient/family education;Visual/perceptual remediation/compensation;Cognitive remediation/compensation;Therapeutic activities;Balance training;Psychosocial skills training;Coping strategies training     Plan Need to re-cert. Dynamic balance/mobility while addressing balance, dual tasking, and  activity tolerance for IADLs    Consulted and Agree with Plan of Care Patient             Patient will benefit from skilled therapeutic intervention in order to improve the following deficits and impairments:   Body Structure / Function / Physical Skills: ADL, Balance, Body mechanics, Cardiopulmonary status limiting activity, Coordination, Endurance, FMC, GMC, IADL, Mobility, Pain, ROM, Proprioception, Sensation, Strength, UE functional use, Vision   Psychosocial Skills: Environmental  Adaptations, Routines and Behaviors   Visit Diagnosis: Unsteadiness on feet  Muscle weakness (generalized)  Attention and concentration deficit  Other abnormalities of gait and mobility  Blindness, one eye, low vision other eye, unspecified eyes    Problem List Patient Active Problem List   Diagnosis Date Noted   Hypertension    Pressure injury of skin 03/15/2021   Intracranial hemorrhage ([place]) 03/14/2021   Lower extremity edema    Polymicrobial bacterial infection    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    Enterococcus faecalis infection    Sepsis due to Escherichia coli ([place])    Debility 02/13/2021   Infection of wound hematoma 02/05/2021   Physical debility 12/22/2020   Tear of left hamstring 12/22/2020   Labral tear of left hip joint 12/22/2020   Acute blood loss anemia    Orthostatic hypotension 12/17/2020   History of CVA (cerebrovascular accident) 12/17/2020   Fall 12/15/2020   Hip hematoma, left, initial encounter 12/15/2020   Nondisplaced fracture of coronoid process of left ulna, initial encounter for closed fracture 12/15/2020   Multiple rib fractures 12/15/2020   COVID-19 virus infection 10/12/2020   Constipation 06/22/2020   Change in bowel habits 06/22/2020   Diverticulosis 06/22/2020   Dark stools 06/22/2020   Hemorrhoids 06/22/2020   Pacemaker 01/23/2020   Junctional  rhythm 01/23/2020   Palpitations 01/23/2020   ICH (intracerebral hemorrhage) ([place]) 12/02/2019   A-fib ([place]) 12/02/2019   Anemia 12/02/2019   QT prolongation 12/02/2019   Hypothyroidism 12/02/2019   Gait abnormality 07/10/2018   S/P reverse total shoulder arthroplasty, 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   Persistent atrial fibrillation ([place])    Bilateral ureteral calculi 11/17/2017   Atrial fibrillation with RVR ([place]) 09/15/2017   Atrial flutter ([place]) 07/17/2017   Port catheter in place 04/02/2017   Non-Hodgkin lymphoma, unspecified, intrathoracic lymph nodes (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   Abnormal chest x-ray    Lung mass 02/03/2017   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   OSA (obstructive sleep apnea) 02/03/2015   History of renal calculi 11/16/2014   Renal calculi 11/16/2014   Family history of colon cancer 10/26/2014   Mechanical complication due to cardiac pacemaker pulse generator 11/06/2013   Dyspnea 05/12/2013   Hypothyroidism 04/21/2013   Pleural effusion 04/19/2013   Long term (current) use of anticoagulants 12/20/2010   EPIDERMOID CYST 08/22/2010   Chronic diastolic heart failure ([place]) 08/06/2010   SYNCOPE AND COLLAPSE 07/27/2010   OSTEOARTHRITIS, KNEE, RIGHT 03/15/2010   Class 1 obesity due to excess calories with body mass index (BMI) of 33.0 to 33.9 in adult 12/07/2009   NONSPEC ELEVATION OF LEVELS OF TRANSAMINASE/LDH 09/08/2009  BREAST CANCER, HX OF 07/25/2009   COLONIC POLYPS, HX OF 07/25/2009   TUBULOVILLOUS ADENOMA, COLON 04/29/2008   HYPERGLYCEMIA, FASTING 04/29/2008   HYPERLIPIDEMIA 06/22/2007   CARCINOMA, THYROID GLAND, HX OF 06/22/2007    Simonne Come, OT 09/10/2021, 8:51 PM  [facility] [address] [place], [place], [address] Phone: [phone]   Fax:  [phone]  Name: Kayla Harrison MRN: [id number] Date of Birth: August 22, 1946

## 2021-09-10 NOTE — Therapy (Signed)
[facility] [address] [place], [place], [address] Phone: [phone]   Fax:  [phone]  Patient Details  Name: Kayla Harrison MRN: [id number] Date of Birth: March 12, 1946 Referring Provider:  Garvin Fila, MD  Encounter Date: 09/10/2021  ST - Arrive/Cancel  Necessary for SLP to waken patient multiple times during evaluation session today. SLP decided to forego ST evaluation today. Evaluation rescheduled for 09-26-21. Prior to ST session pt's OT encouraged pt to contact MD and inform MD of her decr in energy level/stamina. 30 minutes later in ST session [name] req'd max cues to recall this detail. 5 minutes after this, pt req'd mod cues to recall.  SLP also encouraged pt to inform her MD of her decr'd stamina/energy level.     [facility] [place] 09/10/2021, 5:28 PM  [facility] [address] [place], [place], [address] Phone: [phone]   Fax:  [phone]

## 2021-09-10 NOTE — Therapy (Signed)
[facility] [address] [place], [place], [address] Phone: [phone]   Fax:  [phone]  Patient Details  Name: Kayla Harrison MRN: [id number] Date of Birth: 1946-03-07 Referring Provider:  Garvin Fila, MD  Encounter Date: 09/10/2021  Patient reports still feeling sick from illness last week. Patient requested to skip PT session d/t feeling fatigues. Participated with OT and ST who both reported patient as appearing drowsy during session. Offered transportation services to return home, which she declined.  Janene Harvey, PT, DPT 09/10/21 10:39 AM   [facility] [place] [address], [place], [place], [address] Phone: [phone]   Fax:  [phone]

## 2021-09-11 DIAGNOSIS — R0609 Other forms of dyspnea: Secondary | ICD-10-CM | POA: Diagnosis not present

## 2021-09-11 DIAGNOSIS — C884 Extranodal marginal zone B-cell lymphoma of mucosa-associated lymphoid tissue [MALT-lymphoma]: Secondary | ICD-10-CM | POA: Diagnosis not present

## 2021-09-11 DIAGNOSIS — G4733 Obstructive sleep apnea (adult) (pediatric): Secondary | ICD-10-CM | POA: Diagnosis not present

## 2021-09-11 DIAGNOSIS — N1831 Chronic kidney disease, stage 3a: Secondary | ICD-10-CM | POA: Diagnosis not present

## 2021-09-11 DIAGNOSIS — I480 Paroxysmal atrial fibrillation: Secondary | ICD-10-CM | POA: Diagnosis not present

## 2021-09-11 DIAGNOSIS — Z7901 Long term (current) use of anticoagulants: Secondary | ICD-10-CM | POA: Diagnosis not present

## 2021-09-11 DIAGNOSIS — R197 Diarrhea, unspecified: Secondary | ICD-10-CM | POA: Diagnosis not present

## 2021-09-11 DIAGNOSIS — I2510 Atherosclerotic heart disease of native coronary artery without angina pectoris: Secondary | ICD-10-CM | POA: Diagnosis not present

## 2021-09-12 ENCOUNTER — Ambulatory Visit: Payer: Medicare Other | Admitting: Occupational Therapy

## 2021-09-12 ENCOUNTER — Ambulatory Visit: Payer: Medicare Other | Admitting: Physical Therapy

## 2021-09-12 ENCOUNTER — Telehealth: Payer: Self-pay | Admitting: Internal Medicine

## 2021-09-12 ENCOUNTER — Other Ambulatory Visit: Payer: Self-pay

## 2021-09-12 DIAGNOSIS — R41840 Attention and concentration deficit: Secondary | ICD-10-CM

## 2021-09-12 DIAGNOSIS — R2681 Unsteadiness on feet: Secondary | ICD-10-CM

## 2021-09-12 NOTE — Therapy (Signed)
[facility] [address] [place], [place], [address] Phone: [phone]   Fax:  [phone]  Patient Details  Name: Kayla Harrison MRN: [id number] Date of Birth: 1945-10-28 Referring Provider:  Garvin Fila, MD  Encounter Date: 09/12/2021  Pt arrived to therapy session and reports that she is "really not feeling well".  Pt with increased fatigue, reporting not sleeping well.  Pt reports seeing a physician and reports concerns of possible c-diff.  Pt unable to engage in any structured task this session due to pt fatigue and general unwell.  Called Dr. Olin Pia office during session due therapist and MD concerns.  Unable to speak with Dr. Caryl Comes, therefore left a message.  Due to recent ongoing medical issues, encouraged pt to follow up with PCP and Cardiology.  Plan to resume therapy after medical issues addressed to allow pt recovery time.   Simonne Come, OT 09/12/2021, 1:06 PM  [facility] [address] [place], [place], [address] Phone: [phone]   Fax:  [phone]

## 2021-09-12 NOTE — Telephone Encounter (Signed)
Attempted to call number listed [phone]) and received message that the number is not in service.

## 2021-09-12 NOTE — Progress Notes (Signed)
Remote pacemaker transmission.   

## 2021-09-12 NOTE — Telephone Encounter (Signed)
Outpatient Client would like to take to with Dr. Caryl Comes about the patient

## 2021-09-13 ENCOUNTER — Telehealth: Payer: Self-pay

## 2021-09-13 NOTE — Telephone Encounter (Signed)
See encounter dated 09/13/2021 for information.

## 2021-09-13 NOTE — Telephone Encounter (Signed)
Per Ree Shay, device clinic RN pt remains in [place].  DCCV scheduled for 10/04/2021 with Dr Marlou Porch.  See letter for complete details.  Spoke with pt and advised of DCCV appointment date and time.  Reviewed instruction latter and placed a hard copy in the mail to pt at her request.  Pt verbalized understanding of all instructions and agrees with current plan.

## 2021-09-13 NOTE — Telephone Encounter (Signed)
Spoke with pt and advised Dr Caryl Comes would like for pt to be scheduled for DCCV d/t Afib and confirmed with pt that she remains on Eliquis 5mg  - 1 tablet by mouth bid despite not being on pt's current medication list. Pt states she feels the best this morning she has felt in 2 months and would like to send another transmission to verify she remains in Afib. Will await review of transmission to proceed with scheduling DCCV.  Pt verbalizes understanding and agrees with current plan.

## 2021-09-13 NOTE — Telephone Encounter (Signed)
Transmission received, patient in atrial flutter with V rates 105-125bpm on presenting.

## 2021-09-19 DIAGNOSIS — I509 Heart failure, unspecified: Secondary | ICD-10-CM | POA: Diagnosis not present

## 2021-09-19 DIAGNOSIS — I2510 Atherosclerotic heart disease of native coronary artery without angina pectoris: Secondary | ICD-10-CM | POA: Diagnosis not present

## 2021-09-19 DIAGNOSIS — R531 Weakness: Secondary | ICD-10-CM | POA: Diagnosis not present

## 2021-09-19 DIAGNOSIS — Z7901 Long term (current) use of anticoagulants: Secondary | ICD-10-CM | POA: Diagnosis not present

## 2021-09-19 DIAGNOSIS — C884 Extranodal marginal zone B-cell lymphoma of mucosa-associated lymphoid tissue [MALT-lymphoma]: Secondary | ICD-10-CM | POA: Diagnosis not present

## 2021-09-19 DIAGNOSIS — G4733 Obstructive sleep apnea (adult) (pediatric): Secondary | ICD-10-CM | POA: Diagnosis not present

## 2021-09-19 DIAGNOSIS — R0609 Other forms of dyspnea: Secondary | ICD-10-CM | POA: Diagnosis not present

## 2021-09-19 DIAGNOSIS — I480 Paroxysmal atrial fibrillation: Secondary | ICD-10-CM | POA: Diagnosis not present

## 2021-09-19 DIAGNOSIS — R197 Diarrhea, unspecified: Secondary | ICD-10-CM | POA: Diagnosis not present

## 2021-09-19 DIAGNOSIS — N1831 Chronic kidney disease, stage 3a: Secondary | ICD-10-CM | POA: Diagnosis not present

## 2021-09-20 DIAGNOSIS — R197 Diarrhea, unspecified: Secondary | ICD-10-CM | POA: Diagnosis not present

## 2021-09-21 ENCOUNTER — Ambulatory Visit: Payer: Medicare Other | Admitting: Occupational Therapy

## 2021-09-21 ENCOUNTER — Ambulatory Visit: Payer: Medicare Other | Admitting: Rehabilitative and Restorative Service Providers"

## 2021-09-21 DIAGNOSIS — I480 Paroxysmal atrial fibrillation: Secondary | ICD-10-CM | POA: Diagnosis not present

## 2021-09-21 DIAGNOSIS — N1831 Chronic kidney disease, stage 3a: Secondary | ICD-10-CM | POA: Diagnosis not present

## 2021-09-21 DIAGNOSIS — I700 Atherosclerosis of aorta: Secondary | ICD-10-CM | POA: Diagnosis not present

## 2021-09-25 ENCOUNTER — Encounter (HOSPITAL_COMMUNITY): Payer: Self-pay | Admitting: Cardiology

## 2021-09-25 NOTE — Progress Notes (Signed)
Called this patient for pre op call for cardioversion on 1/12, and she unfortunately notified me she missed a couple doses of her blood thinner over the last few days. I instructed her to call the cardiologist office and provided her with the number. I also preemptively let the office staff they should receive a call from her soon.

## 2021-09-26 ENCOUNTER — Ambulatory Visit: Payer: Medicare Other | Attending: Neurology

## 2021-09-26 ENCOUNTER — Ambulatory Visit: Payer: Medicare Other | Admitting: Occupational Therapy

## 2021-09-26 ENCOUNTER — Encounter: Payer: Medicare Other | Admitting: Occupational Therapy

## 2021-09-26 ENCOUNTER — Ambulatory Visit: Payer: Medicare Other | Admitting: Physical Therapy

## 2021-09-26 ENCOUNTER — Ambulatory Visit: Payer: Medicare Other

## 2021-09-26 ENCOUNTER — Other Ambulatory Visit: Payer: Self-pay

## 2021-09-26 DIAGNOSIS — R2689 Other abnormalities of gait and mobility: Secondary | ICD-10-CM | POA: Diagnosis not present

## 2021-09-26 DIAGNOSIS — R1310 Dysphagia, unspecified: Secondary | ICD-10-CM | POA: Insufficient documentation

## 2021-09-26 DIAGNOSIS — R41841 Cognitive communication deficit: Secondary | ICD-10-CM | POA: Diagnosis not present

## 2021-09-26 DIAGNOSIS — M6281 Muscle weakness (generalized): Secondary | ICD-10-CM | POA: Insufficient documentation

## 2021-09-26 DIAGNOSIS — R41840 Attention and concentration deficit: Secondary | ICD-10-CM | POA: Diagnosis not present

## 2021-09-26 DIAGNOSIS — R2681 Unsteadiness on feet: Secondary | ICD-10-CM | POA: Diagnosis not present

## 2021-09-26 NOTE — Therapy (Addendum)
[facility] [address], [place], [place], [address] Phone: [phone]   Fax:  [phone]  Speech Language Pathology Evaluation  Patient Details  Name: Kayla Harrison MRN: [id number] Date of Birth: [birth year] Referring Provider (SLP): Royal Hawthorn, MD   Encounter Date: 09/26/2021   End of Session - 09/26/21 1326     Visit Number 1    Number of Visits 13    Date for SLP Re-Evaluation 11/23/21    SLP Start Time 0933    SLP Stop Time  4315    SLP Time Calculation (min) 43 min    Activity Tolerance Patient tolerated treatment well             Past Medical History:  Diagnosis Date   Anemia    Arthritis    osteoarthritis - knees and right shoulder   Blood transfusion without reported diagnosis    Breast cancer ([place])    Dr Margot Chimes, total thyroidectomy- 1999- for cancer   Brucellosis 1964   Chronic bilateral pleural effusions    Colon polyp    Dr Earlean Shawl   Complication of anesthesia    Ketamine produces LSD reaction, bright colored nightmarish experience    Dyslipidemia    Endometriosis    Fibroids    H/O pleural effusion    s/p thoracentesis w 3226ml withdrawn   Hepatitis    [place], ?hepatitis    History of dysphagia    due to radiation therapy   History of hiatal hernia    small noted on PET scan   Hypertension    Hypothyroidism    Lung cancer, lower lobe ([place]) 01/2017   radiation RX completed 03/04/17; will start chemo 6/27, pt unaware of lung cancer   Morbid obesity ([place])    Status post lap band surgery   Nephrolithiasis    Non Hodgkin's lymphoma ([place])    on chemotherapy   Persistent atrial fibrillation ([place])    a. s/p PVI 2008 b. s/p convergent ablation 4008 complicated by bradycardia requiring pacemaker implant   Personal history of radiation therapy    Presence of permanent cardiac pacemaker    Rotator cuff tear    Right   Stroke ([place])    2003- [place] x2   SVC  syndrome    with lung mass and non hodgkins lymphoma   Thyroid cancer ([place]) 2000    Past Surgical History:  Procedure Laterality Date   ABDOMINAL HYSTERECTOMY  1983   afib ablation     a. 2008 PVI b. 2014 convergent ablation   APPENDECTOMY     BONE MARROW BIOPSY  02/21/2017   BREAST LUMPECTOMY Left 2010   [place]     2015- negative   CARDIOVERSION  10/09/2012   Procedure: CARDIOVERSION;  Surgeon: Minus Breeding, MD;  Location: [facility];  Service: Cardiovascular;  Laterality: N/A;   CARDIOVERSION  10/09/2012   Procedure: CARDIOVERSION;  Surgeon: Minus Breeding, MD;  Location: [facility] ENDOSCOPY;  Service: Cardiovascular;  Laterality: N/A;  [name] gave the ok to add pt to the add on , but we must check to find out if the can add pt on at [phone] ( [phone])   CARDIOVERSION N/A 11/20/2012   Procedure: CARDIOVERSION;  Surgeon: Fay Records, MD;  Location: [facility] ENDOSCOPY;  Service: Cardiovascular;  Laterality: N/A;   CARDIOVERSION N/A 07/18/2017   Procedure: CARDIOVERSION;  Surgeon: Thayer Headings, MD;  Location:  [facility] ENDOSCOPY;  Service: Cardiovascular;  Laterality: N/A;   CARDIOVERSION N/A 10/03/2017   Procedure: CARDIOVERSION;  Surgeon: Sanda Klein, MD;  Location: [facility] ENDOSCOPY;  Service: Cardiovascular;  Laterality: N/A;   CARDIOVERSION N/A 01/07/2018   Procedure: CARDIOVERSION;  Surgeon: Thayer Headings, MD;  Location: [facility] ENDOSCOPY;  Service: Cardiovascular;  Laterality: N/A;   CARDIOVERSION N/A 12/10/2019   Procedure: CARDIOVERSION;  Surgeon: Buford Dresser, MD;  Location: [facility] ENDOSCOPY;  Service: Cardiovascular;  Laterality: N/A;   CARDIOVERSION N/A 03/09/2021   Procedure: CARDIOVERSION;  Surgeon: Pixie Casino, MD;  Location: [facility];  Service: Cardiovascular;  Laterality: N/A;   CHOLECYSTECTOMY     COLONOSCOPY W/ POLYPECTOMY     Dr Earlean Shawl   CYSTOSCOPY N/A 02/06/2015   Procedure: CYSTOSCOPY;  Surgeon: Kathie Rhodes, MD;  [facility]: [facility];  Service: Urology;   Laterality: N/A;   CYSTOSCOPY W/ RETROGRADES Left 11/17/2017   Procedure: CYSTOSCOPY WITH RETROGRADE /PYELOGRAM/;  Surgeon: Kathie Rhodes, MD;  Location: [facility];  Service: Urology;  Laterality: Left;   CYSTOSCOPY WITH RETROGRADE PYELOGRAM, URETEROSCOPY AND STENT PLACEMENT Right 02/06/2015   Procedure: RETROGRADE PYELOGRAM, RIGHT URETEROSCOPY STENT PLACEMENT;  Surgeon: Kathie Rhodes, MD;  [facility];  Service: Urology;  Laterality: Right;   CYSTOSCOPY WITH RETROGRADE PYELOGRAM, URETEROSCOPY AND STENT PLACEMENT Right 03/07/2017   Procedure: CYSTOSCOPY WITH RIGHT RETROGRADE PYELOGRAM,RIGHT  URETEROSCOPYLASER LITHOTRIPSY  AND STENT PLACEMENT AND STONE BASKETRY;  Surgeon: Kathie Rhodes, MD;  Location: [facility];  Service: Urology;  Laterality: Right;   EYE SURGERY     cataract surgery   fatty mass removal  1999   pubic area   HOLMIUM LASER APPLICATION N/A 3/88/8280   Procedure: HOLMIUM LASER APPLICATION;  Surgeon: Kathie Rhodes, MD;  [facility];  Service: Urology;  Laterality: N/A;   HOLMIUM LASER APPLICATION Right 0/34/9179   Procedure: HOLMIUM LASER APPLICATION;  Surgeon: Kathie Rhodes, MD;  Location: [facility];  Service: Urology;  Laterality: Right;   HOLMIUM LASER APPLICATION Left 1/50/5697   Procedure: HOLMIUM LASER APPLICATION;  Surgeon: Kathie Rhodes, MD;  [facility];  Service: Urology;  Laterality: Left;   I & D EXTREMITY Left 12/19/2020   Procedure: IRRIGATION AND DEBRIDEMENT OF LEFT HIP HEMATOMA WITH APPLICATION OF WOUND VAC;  Surgeon: Erle Crocker, MD;  Location: [facility];  Service: Orthopedics;  Laterality: Left;   I & D EXTREMITY Left 02/06/2021   Procedure: IRRIGATION AND DEBRIDEMENT DEEP ABCESS LEFT THIGH, SECONDARY CLOSURE OF WOUND DEHISCENCE;  Surgeon: Erle Crocker, MD;  Location: [facility];  Service: Orthopedics;  Laterality: Left;   IR FLUORO GUIDE PORT INSERTION RIGHT  02/24/2017   IR NEPHROSTOMY PLACEMENT RIGHT  11/17/2017   IR  PATIENT EVAL TECH 0-60 MINS  03/11/2017   IR REMOVAL TUN ACCESS W/ PORT W/O FL MOD SED  04/20/2018   IR US GUIDE VASC ACCESS RIGHT  02/24/2017   KNEE ARTHROSCOPY     bilateral   LAPAROSCOPIC GASTRIC BANDING  07/10/2010   LAPAROSCOPIC GASTRIC BANDING     Laparoscopic adjustable banding APS System with posterior hiatal hernia, 2 suture.   LAPAROTOMY     for ruptured ovary and ovarian artery    NEPHROLITHOTOMY Right 11/17/2017   Procedure: NEPHROLITHOTOMY PERCUTANEOUS;  Surgeon: Kathie Rhodes, MD;  [facility];  Service: Urology;  Laterality: Right;   PACEMAKER INSERTION  03/10/2013   MDT dual chamber PPM   POCKET REVISION N/A 12/08/2013   Procedure: POCKET REVISION;  Surgeon: Deboraha Sprang,  MD;  Location: [facility] CATH LAB;  Service: Cardiovascular;  Laterality: N/A;   PORTA CATH INSERTION     REVERSE SHOULDER ARTHROPLASTY Right 05/14/2018   Procedure: RIGHT REVERSE SHOULDER ARTHROPLASTY;  Surgeon: Tania Ade, MD;  Location: [facility];  Service: Orthopedics;  Laterality: Right;   REVERSE SHOULDER REPLACEMENT Right 05/14/2018   RIGHT HEART CATH N/A 07/21/2019   Procedure: RIGHT HEART CATH;  Surgeon: Larey Dresser, MD;  Location: [facility] CV LAB;  Service: Cardiovascular;  Laterality: N/A;   TEE WITH CARDIOVERSION  09/22/2017   TEE WITHOUT CARDIOVERSION N/A 10/03/2017   Procedure: TRANSESOPHAGEAL ECHOCARDIOGRAM (TEE);  Surgeon: Sanda Klein, MD;  Location: [facility] ENDOSCOPY;  Service: Cardiovascular;  Laterality: N/A;   TEE WITHOUT CARDIOVERSION N/A 08/04/2019   Procedure: TRANSESOPHAGEAL ECHOCARDIOGRAM (TEE);  Surgeon: Larey Dresser, MD;  Location: [facility] ENDOSCOPY;  Service: Cardiovascular;  Laterality: N/A;   TEE WITHOUT CARDIOVERSION N/A 12/10/2019   Procedure: TRANSESOPHAGEAL ECHOCARDIOGRAM (TEE);  Surgeon: Buford Dresser, MD;  Location: [facility] ENDOSCOPY;  Service: Cardiovascular;  Laterality: N/A;   TEE WITHOUT CARDIOVERSION N/A 03/09/2021   Procedure: TRANSESOPHAGEAL ECHOCARDIOGRAM  (TEE);  Surgeon: Pixie Casino, MD;  Location: [facility];  Service: Cardiovascular;  Laterality: N/A;   THYROIDECTOMY  1998   Dr Margot Chimes   TONSILLECTOMY     TOTAL KNEE ARTHROPLASTY  04/13/2012   Procedure: TOTAL KNEE ARTHROPLASTY;  Surgeon: Rudean Haskell, MD;  Location: [facility];  Service: Orthopedics;  Laterality: Right;   VIDEO BRONCHOSCOPY WITH ENDOBRONCHIAL ULTRASOUND N/A 02/07/2017   Procedure: VIDEO BRONCHOSCOPY WITH ENDOBRONCHIAL ULTRASOUND;  Surgeon: Marshell Garfinkel, MD;  Location: [facility];  Service: Pulmonary;  Laterality: N/A;    There were no vitals filed for this visit.   Subjective Assessment - 09/26/21 1040     Subjective "Did they think I had a stroke the first time?" Pt could not recall when that time period was that she was referring to.                SLP Evaluation [facility] - 09/26/21 1041       SLP Visit Information   SLP Received On 09/26/21    Referring Provider (SLP) Royal Hawthorn, MD    Onset Date June 2022    Medical Diagnosis CVA      Subjective   Subjective "But you didn't tell me to space the numbers (correctly)."    Patient/Family Stated Goal improve memory      Pain Assessment   Currently in Pain? No/denies      General Information   HPI Pt found down at home 03-14-21 after recent d/c from [facility] for non-neuro admission. Acute CVA suspected. Pt had ST on CIR for cognition, and some HHST. Outpatient OT/PT orders rec'd and those therapists thought [place] would benefit pt as pt she demonstrated cognitive deficits of which she exhibits intermittent awareness.      Balance Screen   Has the patient fallen in the past 6 months Yes    How many times? 4    Has the patient had a decrease in activity level because of a fear of falling?  No    Is the patient reluctant to leave their home because of a fear of falling?  No      Prior Functional Status   Cognitive/Linguistic Baseline Within functional limits    Type of Home Apartment     Lives With Alone    Vocation  Retired      Associate Professor   Overall Cognitive Status  Impaired/Different from baseline    Area of Impairment Awareness;Safety/judgement;Memory;Attention    Attention Comments decr'd selective attention vs./and processing deficits    Memory Comments Pt asking about her health history - if she had a CVA her first time in the hospital band could not recall date/month of this admission. Stated in Epic pt missed blood thinner meds x2 days recently.    Safety and Judgement Comments Heavily tied to awareness. Pt with notable "explaining away deficits" comment/s each ST session thus far (2 out of 2).    Awareness Comments OT reports to SLP that pt cleaning service called during their appointment and told pt they could not accept checks pt wrote before Christmas due to not being signed, and $ amount being incorrect. Today pt made multiple errors and only noted them rarely.    Awareness Impaired    Awareness Impairment Intellectual impairment;Emergent impairment    Behaviors --   slow processing     Auditory Comprehension   Overall Auditory Comprehension Appears within functional limits for tasks assessed      Verbal Expression   Overall Verbal Expression Appears within functional limits for tasks assessed      Oral Motor/Sensory Function   Overall Oral Motor/Sensory Function Lingual ROM Lingual Strength Lingual Symmerty Labial ROM Labial Symmetry Labial Strength Impaired WFL Reduced; lt> more reduced that rt [facility] WFL Slight decr lt  Reduced     Motor Speech   Overall Motor Speech Appears within functional limits for tasks assessed                             SLP Education - 09/26/21 1325     Education Details ST is warranted based upon how pt performed on clock task and trailmaking tasks    Person(s) Educated Patient    Methods Explanation    Comprehension Verbalized understanding              SLP Short Term Goals - 09/26/21 1335       SLP SHORT TERM GOAL  #1   Title pt will track medication administration with a medication tracking sheet 80% of the time    Time 4    Period Weeks    Status New    Target Date 10/26/21      SLP SHORT TERM GOAL #2   Title pt will verbalize 3 deficts/changes in thinking skills compared to 12 months ago in 2 sessions    Time 4    Period Weeks    Target Date 10/26/21      SLP SHORT TERM GOAL #3   Title pt will complete an 8 minute therapy task x3 in 2 sessions    Time 4    Period Weeks    Status New    Target Date 10/26/21      SLP SHORT TERM GOAL #4   Title pt will demonstrate emergent awareness when writing checks with occasional min A from SLP    Time 4    Period Weeks    Status New    Target Date 10/26/21              SLP Long Term Goals - 09/26/21 1338       SLP LONG TERM GOAL #1   Title pt will demo emergent awareness with detailed therapy tasks by correcting her errors for 100% accuracy with modified independence (extra time, double-checking, etc) in 3 sessions    Time  6    Period Weeks    Status New    Target Date 11/09/21      SLP LONG TERM GOAL #2   Title pt will demo memory and emergent awarenss adequate to track medication administration for 10 consecutive days without an error    Time 6    Period Weeks    Status New    Target Date 11/09/21      SLP LONG TERM GOAL #3   Title pt will demo sustained and selective attention sufficient to complete a 10 minute therapy task x2, in 2 sessions    Time 6    Period Weeks    Status New    Target Date 11/09/21      SLP LONG TERM GOAL #4   Title pt will demo anticipatory awareness by independently double-checking therapy tasks 100% of the time in 3 sessions    Time 6    Period Weeks    Status New    Target Date 11/09/21              Plan - 09/26/21 1327     Clinical Impression Statement Kayla Harrison presents today with some cognitive communication deficits in the areas of awareness (intellectual, emergent, and anticapatory),  memory, and likely some component of attention. She also demonstrated decr'd processing speed today and told SLP during word generation "It's so hard for me to come up with these quickly" and affirmed that she would have generated more quickly 5 years ago. SLP reiterated these changes are due to her CVA. Pt has been known to rarely arrive early or late for PT and/or OT appointment times stating she looked at her calendar incorrectly. Additionally, today pt's [place] called during OT session informing pt she filled out checks to them incorrectly (did not sign one and put incorrect figure for check.) SLP believes pt would benefit from skilled ST targeting organization, memory, awareness, and some attention skills in order to improve pt's independence adn safety living alone. X2/week was recommended however pt states she thinks she can only attend ST x1/week at this time due to stamina, so she will be scheduled at x1/week with hopes during this 6 week plan of care that she can incr to x2/week.    Speech Therapy Frequency 2x / week    Duration --   6 weeks, however pt states once/week is preferred   Treatment/Interventions Environmental controls;Functional tasks;SLP instruction and feedback;Cueing hierarchy;Cognitive reorganization;Compensatory strategies;Internal/external aids;Patient/family education    Potential to Achieve Goals Fair    Potential Considerations Cooperation/participation level;Severity of impairments;Family/community support;Ability to learn/carryover information    Consulted and Agree with Plan of Care Patient             Patient will benefit from skilled therapeutic intervention in order to improve the following deficits and impairments:   Cognitive communication deficit - Plan: SLP plan of care cert/re-cert    Problem List Patient Active Problem List   Diagnosis Date Noted   Hypertension    Pressure injury of skin 03/15/2021   Intracranial hemorrhage ([place])  03/14/2021   Lower extremity edema    Polymicrobial bacterial infection    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    Enterococcus faecalis infection    Sepsis due to Escherichia coli ([place])    Debility 02/13/2021   Infection of wound hematoma 02/05/2021   Physical debility 12/22/2020   Tear of left hamstring 12/22/2020   Labral tear of left hip joint 12/22/2020   Acute blood  loss anemia    Orthostatic hypotension 12/17/2020   History of CVA (cerebrovascular accident) 12/17/2020   Fall 12/15/2020   Hip hematoma, left, initial encounter 12/15/2020   Nondisplaced fracture of coronoid process of left ulna, initial encounter for closed fracture 12/15/2020   Multiple rib fractures 12/15/2020   COVID-19 virus infection 10/12/2020   Constipation 06/22/2020   Change in bowel habits 06/22/2020   Diverticulosis 06/22/2020   Dark stools 06/22/2020   Hemorrhoids 06/22/2020   Pacemaker 01/23/2020   Junctional rhythm 01/23/2020   Palpitations 01/23/2020   ICH (intracerebral hemorrhage) ([place]) 12/02/2019   A-fib ([place]) 12/02/2019   Anemia 12/02/2019   QT prolongation 12/02/2019   Hypothyroidism 12/02/2019   Gait abnormality 07/10/2018   S/P reverse total shoulder arthroplasty, 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   Persistent atrial fibrillation ([place])    Bilateral ureteral calculi 11/17/2017   Atrial fibrillation with RVR ([place]) 09/15/2017   Atrial flutter ([place]) 07/17/2017   Port catheter in place 04/02/2017   Non-Hodgkin lymphoma, unspecified, intrathoracic lymph nodes ([place]) 02/13/2017   Mediastinal mass 02/06/2017   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   Abnormal chest x-ray    Lung mass 02/03/2017   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   OSA (obstructive sleep apnea) 02/03/2015   History of renal calculi 11/16/2014   Renal calculi 11/16/2014   Family history of colon cancer 10/26/2014   Mechanical complication due to cardiac pacemaker pulse generator 11/06/2013   Dyspnea 05/12/2013    Hypothyroidism 04/21/2013   Pleural effusion 04/19/2013   Long term (current) use of anticoagulants 12/20/2010   EPIDERMOID CYST 08/22/2010   Chronic diastolic heart failure ([place]) 08/06/2010   SYNCOPE AND COLLAPSE 07/27/2010   OSTEOARTHRITIS, KNEE, RIGHT 03/15/2010   Class 1 obesity due to excess calories with body mass index (BMI) of 33.0 to 33.9 in adult 12/07/2009   NONSPEC ELEVATION OF LEVELS OF TRANSAMINASE/LDH 09/08/2009   BREAST CANCER, HX OF 07/25/2009   COLONIC POLYPS, HX OF 07/25/2009   TUBULOVILLOUS ADENOMA, 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, [place], [place], [address] Phone: [phone]   Fax:  [phone]  Name: Kayla Harrison MRN: [id number] Date of Birth: 01/19/1946

## 2021-09-26 NOTE — Therapy (Signed)
[facility], [place], [place], [address] Phone: [phone]   Fax:  [phone]  Occupational Therapy Treatment  Patient Details  Name: Kayla Harrison MRN: [id number] Date of Birth: [birth year] Referring Provider (OT): Leonie Man   Encounter Date: 09/26/2021   OT End of Session - 09/26/21 1005     Visit Number 13    Number of Visits 17    Date for OT Re-Evaluation 09/12/21    Authorization Type Medicare A and B    Authorization - Visit Number 3    Authorization - Number of Visits 10    Progress Note Due on Visit 10    OT Start Time 1028    OT Stop Time 1107    OT Time Calculation (min) 39 min    Activity Tolerance Patient limited by fatigue    Behavior During Therapy WFL for tasks assessed/performed             Past Medical History:  Diagnosis Date   Anemia    Arthritis    osteoarthritis - knees and right shoulder   Blood transfusion without reported diagnosis    Breast cancer ([place])    Dr Margot Chimes, total thyroidectomy- 1999- for cancer   Brucellosis 1964   Chronic bilateral pleural effusions    Colon polyp    Dr Earlean Shawl   Complication of anesthesia    Ketamine produces LSD reaction, bright colored nightmarish experience    Dyslipidemia    Endometriosis    Fibroids    H/O pleural effusion    s/p thoracentesis w 3285ml withdrawn   Hepatitis    [place] ?hepatitis    History of dysphagia    due to radiation therapy   History of hiatal hernia    small noted on PET scan   Hypertension    Hypothyroidism    Lung cancer, lower lobe ([place]) 01/2017   radiation RX completed 03/04/17; will start chemo 6/27, pt unaware of lung cancer   Morbid obesity ([place])    Status post lap band surgery   Nephrolithiasis    Non Hodgkin's lymphoma ([place])    on chemotherapy   Persistent atrial fibrillation ([place])    a. s/p PVI 2008 b. s/p convergent ablation 9163 complicated by bradycardia  requiring pacemaker implant   Personal history of radiation therapy    Presence of permanent cardiac pacemaker    Rotator cuff tear    Right   Stroke ([place])    2003- [place] x2   SVC syndrome    with lung mass and non hodgkins lymphoma   Thyroid cancer ([place]) 2000    Past Surgical History:  Procedure Laterality Date   ABDOMINAL HYSTERECTOMY  1983   afib ablation     a. 2008 PVI b. 2014 convergent ablation   APPENDECTOMY     BONE MARROW BIOPSY  02/21/2017   BREAST LUMPECTOMY Left 2010   [place]     2015- negative   CARDIOVERSION  10/09/2012   Procedure: CARDIOVERSION;  Surgeon: Minus Breeding, MD;  Location: [place];  Service: Cardiovascular;  Laterality: N/A;   CARDIOVERSION  10/09/2012   Procedure: CARDIOVERSION;  Surgeon: Minus Breeding, MD;  Location: [facility] ENDOSCOPY;  Service: Cardiovascular;  Laterality: N/A;  [name] gave the ok to add pt to the add on , but we must check to find out if the can add pt on at [phone] ( [phone])  CARDIOVERSION N/A 11/20/2012   Procedure: CARDIOVERSION;  Surgeon: Fay Records, MD;  Location: [place];  Service: Cardiovascular;  Laterality: N/A;   CARDIOVERSION N/A 07/18/2017   Procedure: CARDIOVERSION;  Surgeon: Thayer Headings, MD;  Location: [place];  Service: Cardiovascular;  Laterality: N/A;   CARDIOVERSION N/A 10/03/2017   Procedure: CARDIOVERSION;  Surgeon: Sanda Klein, MD;  Location: [facility] ENDOSCOPY;  Service: Cardiovascular;  Laterality: N/A;   CARDIOVERSION N/A 01/07/2018   Procedure: CARDIOVERSION;  Surgeon: Acie Fredrickson Wonda Cheng, MD;  Location: [facility] ENDOSCOPY;  Service: Cardiovascular;  Laterality: N/A;   CARDIOVERSION N/A 12/10/2019   Procedure: CARDIOVERSION;  Surgeon: Buford Dresser, MD;  Location: [facility] ENDOSCOPY;  Service: Cardiovascular;  Laterality: N/A;   CARDIOVERSION N/A 03/09/2021   Procedure: CARDIOVERSION;  Surgeon: Pixie Casino, MD;  Location: [place];  Service: Cardiovascular;  Laterality:  N/A;   CHOLECYSTECTOMY     COLONOSCOPY W/ POLYPECTOMY     Dr Earlean Shawl   CYSTOSCOPY N/A 02/06/2015   Procedure: CYSTOSCOPY;  Surgeon: Kathie Rhodes, MD;  Location: [facility];  Service: Urology;  Laterality: N/A;   CYSTOSCOPY W/ RETROGRADES Left 11/17/2017   Procedure: CYSTOSCOPY WITH RETROGRADE /PYELOGRAM/;  Surgeon: Kathie Rhodes, MD;  Location: [facility];  Service: Urology;  Laterality: Left;   CYSTOSCOPY WITH RETROGRADE PYELOGRAM, URETEROSCOPY AND STENT PLACEMENT Right 02/06/2015   Procedure: RETROGRADE PYELOGRAM, RIGHT URETEROSCOPY STENT PLACEMENT;  Surgeon: Kathie Rhodes, MD;  Location: [facility];  Service: Urology;  Laterality: Right;   CYSTOSCOPY WITH RETROGRADE PYELOGRAM, URETEROSCOPY AND STENT PLACEMENT Right 03/07/2017   Procedure: CYSTOSCOPY WITH RIGHT RETROGRADE PYELOGRAM,RIGHT  URETEROSCOPYLASER LITHOTRIPSY  AND STENT PLACEMENT AND STONE BASKETRY;  Surgeon: Kathie Rhodes, MD;  Location: [place];  Service: Urology;  Laterality: Right;   EYE SURGERY     cataract surgery   fatty mass removal  1999   pubic area   HOLMIUM LASER APPLICATION N/A 0/62/3762   Procedure: HOLMIUM LASER APPLICATION;  Surgeon: Kathie Rhodes, MD;  Location: [facility];  Service: Urology;  Laterality: N/A;   HOLMIUM LASER APPLICATION Right 05/24/5175   Procedure: HOLMIUM LASER APPLICATION;  Surgeon: Kathie Rhodes, MD;  Location: [facility];  Service: Urology;  Laterality: Right;   HOLMIUM LASER APPLICATION Left 1/60/7371   Procedure: HOLMIUM LASER APPLICATION;  Surgeon: Kathie Rhodes, MD;  Location: [facility];  Service: Urology;  Laterality: Left;   I & D EXTREMITY Left 12/19/2020   Procedure: IRRIGATION AND DEBRIDEMENT OF LEFT HIP HEMATOMA WITH APPLICATION OF WOUND VAC;  Surgeon: Erle Crocker, MD;  Location: [place];  Service: Orthopedics;  Laterality: Left;   I & D EXTREMITY Left 02/06/2021   Procedure: IRRIGATION AND DEBRIDEMENT DEEP ABCESS LEFT THIGH, SECONDARY CLOSURE OF WOUND DEHISCENCE;   Surgeon: Erle Crocker, MD;  Location: [place];  Service: Orthopedics;  Laterality: Left;   IR FLUORO GUIDE PORT INSERTION RIGHT  02/24/2017   IR NEPHROSTOMY PLACEMENT RIGHT  11/17/2017   IR PATIENT EVAL TECH 0-60 MINS  03/11/2017   IR REMOVAL TUN ACCESS W/ PORT W/O FL MOD SED  04/20/2018   IR US GUIDE VASC ACCESS RIGHT  02/24/2017   KNEE ARTHROSCOPY     bilateral   LAPAROSCOPIC GASTRIC BANDING  07/10/2010   LAPAROSCOPIC GASTRIC BANDING     Laparoscopic adjustable banding APS System with posterior hiatal hernia, 2 suture.   LAPAROTOMY     for ruptured ovary and ovarian artery    NEPHROLITHOTOMY Right 11/17/2017   Procedure: NEPHROLITHOTOMY PERCUTANEOUS;  Surgeon: Karsten Ro,  Elta Guadeloupe, MD;  Location: [facility];  Service: Urology;  Laterality: Right;   PACEMAKER INSERTION  03/10/2013   MDT dual chamber PPM   POCKET REVISION N/A 12/08/2013   Procedure: POCKET REVISION;  Surgeon: Deboraha Sprang, MD;  Location: [facility] CATH LAB;  Service: Cardiovascular;  Laterality: N/A;   PORTA CATH INSERTION     REVERSE SHOULDER ARTHROPLASTY Right 05/14/2018   Procedure: RIGHT REVERSE SHOULDER ARTHROPLASTY;  Surgeon: Tania Ade, MD;  Location: [place];  Service: Orthopedics;  Laterality: Right;   REVERSE SHOULDER REPLACEMENT Right 05/14/2018   RIGHT HEART CATH N/A 07/21/2019   Procedure: RIGHT HEART CATH;  Surgeon: Larey Dresser, MD;  Location: [place] CV LAB;  Service: Cardiovascular;  Laterality: N/A;   TEE WITH CARDIOVERSION  09/22/2017   TEE WITHOUT CARDIOVERSION N/A 10/03/2017   Procedure: TRANSESOPHAGEAL ECHOCARDIOGRAM (TEE);  Surgeon: Sanda Klein, MD;  Location: [facility] ENDOSCOPY;  Service: Cardiovascular;  Laterality: N/A;   TEE WITHOUT CARDIOVERSION N/A 08/04/2019   Procedure: TRANSESOPHAGEAL ECHOCARDIOGRAM (TEE);  Surgeon: Larey Dresser, MD;  Location: [facility] ENDOSCOPY;  Service: Cardiovascular;  Laterality: N/A;   TEE WITHOUT CARDIOVERSION N/A 12/10/2019   Procedure: TRANSESOPHAGEAL ECHOCARDIOGRAM (TEE);   Surgeon: Buford Dresser, MD;  Location: [facility] ENDOSCOPY;  Service: Cardiovascular;  Laterality: N/A;   TEE WITHOUT CARDIOVERSION N/A 03/09/2021   Procedure: TRANSESOPHAGEAL ECHOCARDIOGRAM (TEE);  Surgeon: Pixie Casino, MD;  Location: [place];  Service: Cardiovascular;  Laterality: N/A;   THYROIDECTOMY  1998   Dr Margot Chimes   TONSILLECTOMY     TOTAL KNEE ARTHROPLASTY  04/13/2012   Procedure: TOTAL KNEE ARTHROPLASTY;  Surgeon: Rudean Haskell, MD;  Location: [place];  Service: Orthopedics;  Laterality: Right;   VIDEO BRONCHOSCOPY WITH ENDOBRONCHIAL ULTRASOUND N/A 02/07/2017   Procedure: VIDEO BRONCHOSCOPY WITH ENDOBRONCHIAL ULTRASOUND;  Surgeon: Marshell Garfinkel, MD;  Location: [place];  Service: Pulmonary;  Laterality: N/A;    There were no vitals filed for this visit.   Subjective Assessment - 09/26/21 1004     Subjective  Pt reports feeling that her balance is getting better and that she is sleeping better.    Pertinent History h/o hyperlipidemia, atrial fibrillation with pacemaker on Eliquis, lymphoma, confusion, subcortical hemorrhage on the left with intraventricular extension    Pain Onset In the past 7 days                          OT Treatments/Exercises (OP) - 09/26/21 1124       ADLs   ADL Comments Engaged in lengthy discussion about pt reports of decreaed support system and difficulty with meal prep.  pt reports feeling challenges due to living alone, expressing no suport from friends or family, stating "I feel so abandoned".  Discussed HHOT, SWK, home caregiver/aide, community engagement vs pursuing independent/assited living.  Pt reports difficulty with medical and money management, making errors of omission and even completely forgetting to complete.      Shoulder Exercises: Therapy Ball   Other Therapy Ball Exercises Engaged in 1 set of chest overhead and chest presses with 1.5kg ball.  Noted decreased endurance and increased reliance on BLE support on mat  table during activity.    Other Therapy Ball Exercises Completed sit > stand while holding 1.5kg ball with focus on sit > stand, weight shifting, and endurance.  Pt with difficulty after 3 but able to complete with increased effort.  OT Education - 09/26/21 1122     Education Details Educated on energy conservation during ADLs/IADLs.  Education on looking in to more support at home from friends or even home health due to pt expressing concerns with completing money management, medication management, and minimal endurance to complete IADLs.    Person(s) Educated Patient    Methods Explanation    Comprehension Need further instruction              OT Short Term Goals - 08/29/21 1009       OT SHORT TERM GOAL #1   Title Pt will verbalize understanding of 3 compensatory strategies for low vision.   requires min cues to recall modifications   Time 4    Period Weeks    Status Partially Met    Target Date 08/15/21      OT SHORT TERM GOAL #2   Title Pt will improve hand strength and endurance to improve handwriting legiblity to 90%    Time 4    Period Weeks    Status Partially Met      OT SHORT TERM GOAL #3   Title Pt will demonstrate improved activity tolerance to complete standing acitivity for 15 mins without seated rest break.    Time 4    Period Weeks    Status Not Met   pt able to stand 13 mins during activity before requring seated rest break     OT SHORT TERM GOAL #4   Title Pt will improve shoulder ROM to 140* in RUE to increase functional reach in to cabinets    Time 4    Period Weeks    Status Achieved             OT Short Term Goals - 09/26/21 1129       OT SHORT TERM GOAL #1   Title Pt will verbalize understanding of 3 energy conservastion strategies to increase success with IADLs.    Time 3    Period Weeks    Status New    Target Date 10/17/21      OT SHORT TERM GOAL #2   Title Pt will be Independent in completing HEP for  activity tolerance and generalized strengthening to increase independence with ADLs and IADLs.    Time 3    Period Weeks    Status New      OT SHORT TERM GOAL #3   Status --                OT Long Term Goals - 09/26/21 1114       OT LONG TERM GOAL #1   Title Pt will be independent in energy conservation strategies.   Pt reports no endurance and no motivation to complete any IADLs.  Pt unable to recall any energy conservation strategies, despite providing education and handouts during prior sessions.   Time 8    Period Weeks    Status Not Met      OT LONG TERM GOAL #2   Title Pt will be independent in modifications for low vision.    Time 8    Period Weeks    Status Achieved      OT LONG TERM GOAL #3   Title Pt wil be able to complete housekeeping task incorporating reaching outside BOS without reports of dizziness.   Pt with no c/o dizziness with reaching, however pt with decreased activity tolerance impacting pt participation in IADLs   Time 8  Period Weeks    Status Partially Met      OT LONG TERM GOAL #4   Title Pt will be able to complete dual task activity without cues for sequencing and with improved safety awareness.   Pt is unable to complete dual task activities, pt demonstrating decreased alternating attention and memory impairments   Time 8    Period Weeks    Status Not Met              OT Long Term Goals - 09/26/21 1131       OT LONG TERM GOAL #1   Title Pt will be independent in energy conservation strategies.    Time 6    Period Weeks    Status New    Target Date 11/07/21      OT LONG TERM GOAL #2   Title Pt will complete 10 min moderately challenging task with no rest break to demonstrate increased endurance as needed to engage in ADLs and IADLs.    Time 6    Period Weeks    Status New      OT LONG TERM GOAL #3   Title Pt will look in to independent/assisted living or alternative housing to assist with ADL and IADL needs.    Time 6     Period Weeks    Status New                   Plan - 09/26/21 1005     Clinical Impression Statement Pt extremely limited by fatigue and decreased endurance.  Pt reports difficulty completing IADLs at home, especially when it comes to meal prep.  Pt reports having no energy and no appetite.  Pt also with complaints of no support from her family or friends and feeling very alone and overwhelmed.  Therapist engaged in discussion with pt about looking in to alternative housing as pt may be a great candidate for independent/assisted living community.  Pt generally seems to be making minimal progress due to ongoing battles with various illnesses, impacting pt progress towards goals.  Pt may benefit from additional medical management due to limited participation in therapy sessions. Pt has met 1 LTG, partially met 1 LTG, and did not meet 2 LTGs largely due to multiple medical issues impacting her enedurance and participation in therapy sessions.  Pt also with decreased memory impacting carryover of tasks and HEP at home.    OT Occupational Profile and History Detailed Assessment- Review of Records and additional review of physical, cognitive, psychosocial history related to current functional performance    Occupational performance deficits (Please refer to evaluation for details): ADL's;IADL's;Leisure;Social Participation    Body Structure / Function / Physical Skills ADL;Balance;Body mechanics;Cardiopulmonary status limiting activity;Coordination;Endurance;FMC;GMC;IADL;Mobility;Pain;ROM;Proprioception;Sensation;Strength;UE functional use;Vision    Psychosocial Skills Environmental  Adaptations;Routines and Behaviors    Rehab Potential Good    Clinical Decision Making Limited treatment options, no task modification necessary    Comorbidities Affecting Occupational Performance: May have comorbidities impacting occupational performance    Modification or Assistance to Complete Evaluation  No  modification of tasks or assist necessary to complete eval    OT Frequency 2x / week    OT Duration 6 weeks    OT Treatment/Interventions Self-care/ADL training;Cryotherapy;Electrical Stimulation;Ultrasound;Moist Heat;Fluidtherapy;Therapeutic exercise;Neuromuscular education;Energy conservation;Manual Therapy;Functional Mobility Training;DME and/or AE instruction;Passive range of motion;Patient/family education;Visual/perceptual remediation/compensation;Cognitive remediation/compensation;Therapeutic activities;Balance training;Psychosocial skills training;Coping strategies training    Plan Dynamic balance/mobility while addressing balance, dual tasking, and activity tolerance for IADLs  OT Home Exercise Plan sit > stand and standing balance exercises with weight (see pt instructions)    Consulted and Agree with Plan of Care Patient             Patient will benefit from skilled therapeutic intervention in order to improve the following deficits and impairments:   Body Structure / Function / Physical Skills: ADL, Balance, Body mechanics, Cardiopulmonary status limiting activity, Coordination, Endurance, FMC, GMC, IADL, Mobility, Pain, ROM, Proprioception, Sensation, Strength, UE functional use, Vision   Psychosocial Skills: Environmental  Adaptations, Routines and Behaviors   Visit Diagnosis: Unsteadiness on feet  Muscle weakness (generalized)  Other abnormalities of gait and mobility  Attention and concentration deficit    Problem List Patient Active Problem List   Diagnosis Date Noted   Hypertension    Pressure injury of skin 03/15/2021   Intracranial hemorrhage ([place]) 03/14/2021   Lower extremity edema    Polymicrobial bacterial infection    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    Enterococcus faecalis infection    Sepsis due to Escherichia coli ([place])    Debility 02/13/2021   Infection of wound hematoma 02/05/2021   Physical debility 12/22/2020   Tear of left  hamstring 12/22/2020   Labral tear of left hip joint 12/22/2020   Acute blood loss anemia    Orthostatic hypotension 12/17/2020   History of CVA (cerebrovascular accident) 12/17/2020   Fall 12/15/2020   Hip hematoma, left, initial encounter 12/15/2020   Nondisplaced fracture of coronoid process of left ulna, initial encounter for closed fracture 12/15/2020   Multiple rib fractures 12/15/2020   COVID-19 virus infection 10/12/2020   Constipation 06/22/2020   Change in bowel habits 06/22/2020   Diverticulosis 06/22/2020   Dark stools 06/22/2020   Hemorrhoids 06/22/2020   Pacemaker 01/23/2020   Junctional rhythm 01/23/2020   Palpitations 01/23/2020   ICH (intracerebral hemorrhage) ([place]) 12/02/2019   A-fib ([place]) 12/02/2019   Anemia 12/02/2019   QT prolongation 12/02/2019   Hypothyroidism 12/02/2019   Gait abnormality 07/10/2018   S/P reverse total shoulder arthroplasty, 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   Persistent atrial fibrillation ([place])    Bilateral ureteral calculi 11/17/2017   Atrial fibrillation with RVR ([place]) 09/15/2017   Atrial flutter ([place]) 07/17/2017   Port catheter in place 04/02/2017   Non-Hodgkin lymphoma, unspecified, intrathoracic lymph nodes ([place]) 02/13/2017   Mediastinal mass 02/06/2017   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   Abnormal chest x-ray    Lung mass 02/03/2017   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   OSA (obstructive sleep apnea) 02/03/2015   History of renal calculi 11/16/2014   Renal calculi 11/16/2014   Family history of colon cancer 10/26/2014   Mechanical complication due to cardiac pacemaker pulse generator 11/06/2013   Dyspnea 05/12/2013   Hypothyroidism 04/21/2013   Pleural effusion 04/19/2013   Long term (current) use of anticoagulants 12/20/2010   EPIDERMOID CYST 08/22/2010   Chronic diastolic heart failure ([place]) 08/06/2010   SYNCOPE AND COLLAPSE 07/27/2010   OSTEOARTHRITIS, KNEE, RIGHT 03/15/2010   Class 1 obesity due to excess calories  with body mass index (BMI) of 33.0 to 33.9 in adult 12/07/2009   NONSPEC ELEVATION OF LEVELS OF TRANSAMINASE/LDH 09/08/2009   BREAST CANCER, HX OF 07/25/2009   COLONIC POLYPS, HX OF 07/25/2009   TUBULOVILLOUS ADENOMA, 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

## 2021-09-26 NOTE — Patient Instructions (Signed)
Balance exercises:  - In standing, reach arms overhead and back to chest height while holding weight.  Complete 1-2 sets of 10  - In standing, reach arms forward then back towards chest while holding weight.  Complete 1-2 sets of 10  - From seated, come up to standing without use of hands/hold a weight in both hands to challenge sit > stand without use of hands.  Complete 1-2 sets of 5  Complete these each day as able!

## 2021-09-27 ENCOUNTER — Telehealth: Payer: Self-pay

## 2021-09-27 NOTE — Telephone Encounter (Signed)
Received notification from pre-surgical nurse with [facility] that pt has missed a few doses of her Eliquis.  Pt is scheduled for DCCV on 10/04/2021.  Per Dr Olin Pia order add TEE, contacted scheduling and TEE added to DCCV.    Phone call to pt and advised of need to add TEE due to missed Eliquis.  Pt advised date , time and instructions all remain the same.  Pt verbalizes understanding and agrees with current plan.

## 2021-09-28 ENCOUNTER — Ambulatory Visit: Payer: Medicare Other | Admitting: Physical Therapy

## 2021-09-28 ENCOUNTER — Other Ambulatory Visit: Payer: Self-pay

## 2021-09-28 ENCOUNTER — Ambulatory Visit: Payer: Medicare Other | Admitting: Occupational Therapy

## 2021-09-28 ENCOUNTER — Encounter: Payer: Self-pay | Admitting: Physical Therapy

## 2021-09-28 ENCOUNTER — Encounter: Payer: Self-pay | Admitting: Occupational Therapy

## 2021-09-28 DIAGNOSIS — R41841 Cognitive communication deficit: Secondary | ICD-10-CM | POA: Diagnosis not present

## 2021-09-28 DIAGNOSIS — R41840 Attention and concentration deficit: Secondary | ICD-10-CM | POA: Diagnosis not present

## 2021-09-28 DIAGNOSIS — M6281 Muscle weakness (generalized): Secondary | ICD-10-CM

## 2021-09-28 DIAGNOSIS — R2681 Unsteadiness on feet: Secondary | ICD-10-CM

## 2021-09-28 DIAGNOSIS — R2689 Other abnormalities of gait and mobility: Secondary | ICD-10-CM | POA: Diagnosis not present

## 2021-09-28 DIAGNOSIS — R1310 Dysphagia, unspecified: Secondary | ICD-10-CM | POA: Diagnosis not present

## 2021-09-28 NOTE — Patient Instructions (Signed)
°  Dynamic balance:  While standing at the counter top, reach forward with one hand for the counter and step forward with the opposite leg simultaneously. Then switch sides and repeat.

## 2021-09-28 NOTE — Therapy (Signed)
[facility] [address], [place], [place], [address] Phone: [phone]   Fax:  [phone]  Physical Therapy Treatment  Patient Details  Name: Kayla Harrison MRN: [id number] Date of Birth: [birth year] Referring Provider (PT): Garvin Fila, MD   Encounter Date: 09/28/2021   PT End of Session - 09/28/21 1029     Visit Number 12    Number of Visits 24    Date for PT Re-Evaluation 11/09/21    Authorization Type Medicare & MoO    PT Start Time 0934    PT Stop Time 1015    PT Time Calculation (min) 41 min    Equipment Utilized During Treatment Gait belt    Activity Tolerance Patient tolerated treatment well    Behavior During Therapy WFL for tasks assessed/performed             Past Medical History:  Diagnosis Date   Anemia    Arthritis    osteoarthritis - knees and right shoulder   Blood transfusion without reported diagnosis    Breast cancer ([place])    Dr Margot Chimes, total thyroidectomy- 1999- for cancer   Brucellosis 1964   Chronic bilateral pleural effusions    Colon polyp    Dr Earlean Shawl   Complication of anesthesia    Ketamine produces LSD reaction, bright colored nightmarish experience    Dyslipidemia    Endometriosis    Fibroids    H/O pleural effusion    s/p thoracentesis w 3254m withdrawn   Hepatitis    [place], ?hepatitis    History of dysphagia    due to radiation therapy   History of hiatal hernia    small noted on PET scan   Hypertension    Hypothyroidism    Lung cancer, lower lobe ([place] 01[place]   radiation RX completed 03/04/17; will start chemo 6/27, pt unaware of lung cancer   Morbid obesity ([place]    Status post lap band surgery   Nephrolithiasis    Non Hodgkin's lymphoma ([place]    on chemotherapy   Persistent atrial fibrillation ([place]    a. s/p PVI 2008 b. s/p convergent ablation 23893complicated by bradycardia requiring pacemaker implant   Personal history  of radiation therapy    Presence of permanent cardiac pacemaker    Rotator cuff tear    Right   [place]   SVC syndrome    with lung mass and non hodgkins lymphoma   [place] cancer [place]    Past Surgical History:  Procedure Laterality Date   ABDOMINAL HYSTERECTOMY  1983   afib ablation     a. 2008 PVI b. 2014 convergent ablation   APPENDECTOMY     BONE MARROW BIOPSY  02/21/2017   BREAST LUMPECTOMY Left 2010   [place]    2015- negative   CARDIOVERSION  10/09/2012   Procedure: CARDIOVERSION;  Surgeon: JMinus Breeding MD;  [facility]  Service: Cardiovascular;  Laterality: N/A;   CARDIOVERSION  10/09/2012   Procedure: CARDIOVERSION;  Surgeon: JMinus Breeding MD;  [facility];  Service: Cardiovascular;  Laterality: N/A;  [name] the ok to add pt to the add on , but we must check to find out if the can add pt on at 1400 ([phone]   CARDIOVERSION N/A 11/20/2012   Procedure: CARDIOVERSION;  Surgeon: PFay Records MD;  Location: [place];  Service: Cardiovascular;  Laterality: N/A;   CARDIOVERSION N/A 07/18/2017   Procedure: CARDIOVERSION;  Surgeon: Acie Fredrickson Wonda Cheng, MD;  Location: [place];  Service: Cardiovascular;  Laterality: N/A;   CARDIOVERSION N/A 10/03/2017   Procedure: CARDIOVERSION;  Surgeon: Sanda Klein, MD;  [facility];  Service: Cardiovascular;  Laterality: N/A;   CARDIOVERSION N/A 01/07/2018   Procedure: CARDIOVERSION;  Surgeon: Thayer Headings, MD;  Location: [facility] ENDOSCOPY;  Service: Cardiovascular;  Laterality: N/A;   CARDIOVERSION N/A 12/10/2019   Procedure: CARDIOVERSION;  Surgeon: Buford Dresser, MD;  [facility];  Service: Cardiovascular;  Laterality: N/A;   CARDIOVERSION N/A 03/09/2021   Procedure: CARDIOVERSION;  Surgeon: Pixie Casino, MD;  Location: [place];  Service: Cardiovascular;  Laterality: N/A;   CHOLECYSTECTOMY     COLONOSCOPY W/  POLYPECTOMY     Dr Earlean Shawl   CYSTOSCOPY N/A 02/06/2015   Procedure: CYSTOSCOPY;  Surgeon: Kathie Rhodes, MD;  [facility];  Service: Urology;  Laterality: N/A;   CYSTOSCOPY W/ RETROGRADES Left 11/17/2017   Procedure: CYSTOSCOPY WITH RETROGRADE /PYELOGRAM/;  Surgeon: Kathie Rhodes, MD;  [facility];  Service: Urology;  Laterality: Left;   CYSTOSCOPY WITH RETROGRADE PYELOGRAM, URETEROSCOPY AND STENT PLACEMENT Right 02/06/2015   Procedure: RETROGRADE PYELOGRAM, RIGHT URETEROSCOPY STENT PLACEMENT;  Surgeon: Kathie Rhodes, MD;  [facility];  Service: Urology;  Laterality: Right;   CYSTOSCOPY WITH RETROGRADE PYELOGRAM, URETEROSCOPY AND STENT PLACEMENT Right 03/07/2017   Procedure: CYSTOSCOPY WITH RIGHT RETROGRADE PYELOGRAM,RIGHT  URETEROSCOPYLASER LITHOTRIPSY  AND STENT PLACEMENT AND STONE BASKETRY;  Surgeon: Kathie Rhodes, MD;  Location: [place];  Service: Urology;  Laterality: Right;   EYE SURGERY     cataract surgery   fatty mass removal  1999   pubic area   HOLMIUM LASER APPLICATION N/A 01/23/8881   Procedure: HOLMIUM LASER APPLICATION;  Surgeon: Kathie Rhodes, MD;  [facility];  Service: Urology;  Laterality: N/A;   HOLMIUM LASER APPLICATION Right 8/00/3491   Procedure: HOLMIUM LASER APPLICATION;  Surgeon: Kathie Rhodes, MD;  Location: [facility];  Service: Urology;  Laterality: Right;   HOLMIUM LASER APPLICATION Left 7/91/5056   Procedure: HOLMIUM LASER APPLICATION;  Surgeon: Kathie Rhodes, MD;  [facility];  Service: Urology;  Laterality: Left;   I & D EXTREMITY Left 12/19/2020   Procedure: IRRIGATION AND DEBRIDEMENT OF LEFT HIP HEMATOMA WITH APPLICATION OF WOUND VAC;  Surgeon: Erle Crocker, MD;  Location: [place];  Service: Orthopedics;  Laterality: Left;   I & D EXTREMITY Left 02/06/2021   Procedure: IRRIGATION AND DEBRIDEMENT DEEP ABCESS LEFT THIGH, SECONDARY CLOSURE OF WOUND DEHISCENCE;  Surgeon: Erle Crocker, MD;  Location:  [place];  Service: Orthopedics;  Laterality: Left;   IR FLUORO GUIDE PORT INSERTION RIGHT  02/24/2017   IR NEPHROSTOMY PLACEMENT RIGHT  11/17/2017   IR PATIENT EVAL TECH 0-60 MINS  03/11/2017   IR REMOVAL TUN ACCESS W/ PORT W/O FL MOD SED  04/20/2018   IR US GUIDE VASC ACCESS RIGHT  02/24/2017   KNEE ARTHROSCOPY     bilateral   LAPAROSCOPIC GASTRIC BANDING  07/10/2010   LAPAROSCOPIC GASTRIC BANDING     Laparoscopic adjustable banding APS System with posterior hiatal hernia, 2 suture.   LAPAROTOMY     for ruptured ovary and ovarian artery    NEPHROLITHOTOMY Right 11/17/2017   Procedure: NEPHROLITHOTOMY PERCUTANEOUS;  Surgeon: Kathie Rhodes, MD;  [facility];  Service: Urology;  Laterality: Right;  PACEMAKER INSERTION  03/10/2013   MDT dual chamber PPM   POCKET REVISION N/A 12/08/2013   Procedure: POCKET REVISION;  Surgeon: Deboraha Sprang, MD;  Location: [facility] LAB;  Service: Cardiovascular;  Laterality: N/A;   PORTA CATH INSERTION     REVERSE SHOULDER ARTHROPLASTY Right 05/14/2018   Procedure: RIGHT REVERSE SHOULDER ARTHROPLASTY;  Surgeon: Tania Ade, MD;  Location: [place];  Service: Orthopedics;  Laterality: Right;   REVERSE SHOULDER REPLACEMENT Right 05/14/2018   RIGHT HEART CATH N/A 07/21/2019   Procedure: RIGHT HEART CATH;  Surgeon: Larey Dresser, MD;  Location: [place] CV LAB;  Service: Cardiovascular;  Laterality: N/A;   TEE WITH CARDIOVERSION  09/22/2017   TEE WITHOUT CARDIOVERSION N/A 10/03/2017   Procedure: TRANSESOPHAGEAL ECHOCARDIOGRAM (TEE);  Surgeon: Sanda Klein, MD;  Location: [facility]COPY;  Service: Cardiovascular;  Laterality: N/A;   TEE WITHOUT CARDIOVERSION N/A 08/04/2019   Procedure: TRANSESOPHAGEAL ECHOCARDIOGRAM (TEE);  Surgeon: Larey Dresser, MD;  Location: [facility] ENDOSCOPY;  Service: Cardiovascular;  Laterality: N/A;   TEE WITHOUT CARDIOVERSION N/A 12/10/2019   Procedure: TRANSESOPHAGEAL ECHOCARDIOGRAM (TEE);  Surgeon: Buford Dresser, MD;   Location: [facility] ENDOSCOPY;  Service: Cardiovascular;  Laterality: N/A;   TEE WITHOUT CARDIOVERSION N/A 03/09/2021   Procedure: TRANSESOPHAGEAL ECHOCARDIOGRAM (TEE);  Surgeon: Pixie Casino, MD;  Location: [place];  Service: Cardiovascular;  Laterality: N/A;   THYROIDECTOMY  1998   Dr Margot Chimes   TONSILLECTOMY     TOTAL KNEE ARTHROPLASTY  04/13/2012   Procedure: TOTAL KNEE ARTHROPLASTY;  Surgeon: Rudean Haskell, MD;  Location: [place];  Service: Orthopedics;  Laterality: Right;   VIDEO BRONCHOSCOPY WITH ENDOBRONCHIAL ULTRASOUND N/A 02/07/2017   Procedure: VIDEO BRONCHOSCOPY WITH ENDOBRONCHIAL ULTRASOUND;  Surgeon: Marshell Garfinkel, MD;  Location: [place];  Service: Pulmonary;  Laterality: N/A;    There were no vitals filed for this visit.   Subjective Assessment - 09/28/21 0935     Subjective Doing better than last week.    Pertinent History anemia, breast CA s/p surgery, lung CA s/p radiation and chemo, lymphoma on chemo, thyroid CA, HLD, hiatal hernia, HTN, a-fib, pacemaker, R reverse TSA 2019, stroke 2003, SVC syndrome, R TKA 2013    Diagnostic tests 03/15/21 head CT: Similar appearance of left caudothalamic hemorrhage with  intraventricular extension and mild adjacent edema. No new  hemorrhage. Unchanged mild trapping of the left lateral ventricle    Patient Stated Goals be able to get up off the floor    Currently in Pain? No/denies                [facility] PT Assessment - 09/28/21 0001       Assessment   Medical Diagnosis Nontraumatic subcortical hemorrhage of left cerebral hemisphere    Referring Provider (PT) Garvin Fila, MD    Onset Date/Surgical Date 02/21/21      Strength   Overall Strength Comments in sitting    Right Hip Flexion 4-/5    Right Hip ABduction 4+/5    Right Hip ADduction 4/5    Left Hip Flexion 4/5    Left Hip ABduction 4/5    Left Hip ADduction 4/5    Right Knee Flexion 4/5    Right Knee Extension 4+/5    Left Knee Flexion 4/5    Left Knee  Extension 4+/5    Right Ankle Dorsiflexion 4+/5    Right Ankle Plantar Flexion 4-/5    Left Ankle Dorsiflexion 4/5    Left Ankle Plantar Flexion 4-/5  Standardized Balance Assessment   Five times sit to stand comments  50.55 sec   first 3 without UEs, last 2 with 1 UE     Berg Balance Test   Sit to Stand Able to stand without using hands and stabilize independently    Standing Unsupported Able to stand 2 minutes with supervision    Sitting with Back Unsupported but Feet Supported on Floor or Stool Able to sit safely and securely 2 minutes    Stand to Sit Sits safely with minimal use of hands    Transfers Able to transfer safely, minor use of hands    Standing Unsupported with Eyes Closed Able to stand 10 seconds with supervision    Standing Unsupported with Feet Together Needs help to attain position but able to stand for 30 seconds with feet together    From Standing, Reach Forward with Outstretched Arm Can reach confidently >25 cm (10")    From Standing Position, Pick up Object from Floor Able to pick up shoe, needs supervision    From Standing Position, Turn to Look Behind Over each Shoulder Needs supervision when turning    Turn 360 Degrees Able to turn 360 degrees safely but slowly    Standing Unsupported, Alternately Place Feet on Step/Stool Able to complete >2 steps/needs minimal assist   completey 8 reps with CGA for safety d/t difficulty and imbalance   Standing Unsupported, One Foot in [employer] balance while stepping or standing    Standing on One Leg Unable to try or needs assist to prevent fall    Total Score 34      Timed Up and Go Test   Normal TUG (seconds) 18.74   with SPC; short step length and poor foot clearance                          [facility] Adult PT Treatment/Exercise - 09/28/21 0001       Knee/Hip Exercises: Seated   Sit to Sand 1 set;5 reps;without UE support   heavy cueing for set up                    PT Education -  09/28/21 1029     Education Details discussion on objective progress and remianing impairments; POC moving foward    Person(s) Educated Patient    Methods Explanation    Comprehension Verbalized understanding              PT Short Term Goals - 09/28/21 1050       PT SHORT TERM GOAL #1   Title Patient to be independent with initial HEP.    Time 3    Period Weeks    Status Achieved    Target Date 08/08/21               PT Long Term Goals - 09/28/21 1051       PT LONG TERM GOAL #1   Title Patient to be independent with advanced HEP.    Time 6    Period Weeks    Status On-going   limited compliance d/t medical concerns/issues   Target Date 11/09/21      PT LONG TERM GOAL #2   Title Patient to demonstrate B LE strength >/=4+/5.    Time 6    Period Weeks    Status Partially Met   weakness still remaining   Target Date 11/09/21      PT  LONG TERM GOAL #3   Title Patient to score at least 46/56 on Berg in order to decrease risk of falls.    Time 6    Period Weeks    Status On-going   34/56   Target Date 11/09/21      PT LONG TERM GOAL #4   Title Patient to complete TUG in <14 sec with LRAD in order to decrease risk of falls.    Time 6    Period Weeks    Status On-going   18.7 sec with [facility]   Target Date 11/09/21      PT LONG TERM GOAL #5   Title Patient to demonstrate 5xSTS test in <15 sec in order to decrease risk of falls.    Time 6    Period Weeks    Status On-going   requires increased time d/t attempting without UEs; 50 sec   Target Date 11/09/21      PT LONG TERM GOAL #6   Title Patient to demonstrate safe floor transfer with mod I.    Time 6    Period Weeks    Status Deferred   NT d/t safety   Target Date 11/09/21                   Plan - 09/28/21 1030     Clinical Impression Statement Patient returns to [place] after a near month-long hiatus d/t medical concerns and fatigue. Reports feeling well today. Scheduled for cardioversion on  10/04/21 to address cardiac concerns. Strength testing revealed slight decrease in strength in B PF, otherwise unchanged. Patient scored 34/56 on Berg, indicating and increased risk of falls however slightly improved since initial assessment. Encouraged patient to use 4WW out in the community instead of [place] d/t poor foot clearance with ambulation. Patient notes trouble transferring her 4WW in/out of the car, thus encouraged RW use instead for ease of transport. TUG grossly unchanged. 5xSTS took significantly more time to complete today however patient able to perform 3 reops without UE assist which she was unable to do prior. Patient still demonstrates weakness, imbalance, and limited mobility. Would benefit from additional skilled PT services 1-2x/week for 6 weeks to address remaining goals.    Personal Factors and Comorbidities Age;Fitness;Comorbidity 3+;Transportation;Time since onset of injury/illness/exacerbation;Past/Current Experience    Comorbidities anemia, breast CA s/p surgery, lung CA s/p radiation and chemo, lymphoma on chemo, thyroid CA, HLD, hiatal hernia, HTN, a-fib, pacemaker, R reverse TSA 2019, stroke 2003, SVC syndrome, R TKA 2013    Examination-Activity Limitations Bathing;Locomotion Level;Transfers;Bed Mobility;Reach Overhead;Bend;Carry;Squat;Dressing;Stairs;Stand;Hygiene/Grooming;Lift;Toileting    Examination-Participation Restrictions Laundry;Shop;Community Activity;Cleaning;Church;Meal Prep    Stability/Clinical Decision Making Stable/Uncomplicated    Rehab Potential Good    PT Frequency Other (comment)   1-2x   PT Duration 6 weeks    PT Treatment/Interventions ADLs/Self Care Home Management;Cryotherapy;Electrical Stimulation;DME Instruction;Moist Heat;Gait training;Stair training;Functional mobility training;Therapeutic activities;Therapeutic exercise;Balance training;Neuromuscular re-education;Manual techniques;Patient/family education;Passive range of motion;Dry needling;Energy  conservation;Vestibular;Vasopneumatic Device;Taping    PT Next Visit Plan balance training with SLS activities; progress LE stretching and strengthening to improve posture and gait deviations; continue gait training with SPC.  Encourage gait with RW; should we ask if family is available for any training/education to optimize carryover outside of therapy sessions?    Consulted and Agree with Plan of Care Patient             Patient will benefit from skilled therapeutic intervention in order to improve the following deficits and impairments:  Abnormal gait, Decreased range  of motion, Difficulty walking, Increased fascial restricitons, Increased muscle spasms, Decreased safety awareness, Decreased endurance, Cardiopulmonary status limiting activity, Decreased activity tolerance, Decreased balance, Impaired flexibility, Improper body mechanics, Postural dysfunction, Decreased strength  Visit Diagnosis: Unsteadiness on feet  Muscle weakness (generalized)  Other abnormalities of gait and mobility     Problem List Patient Active Problem List   Diagnosis Date Noted   Hypertension    Pressure injury of skin 03/15/2021   Intracranial hemorrhage (HCC) 03/14/2021   Lower extremity edema    Polymicrobial bacterial infection    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    Enterococcus faecalis infection    Sepsis due to Escherichia coli ([place])    Debility 02/13/2021   Infection of wound hematoma 02/05/2021   Physical debility 12/22/2020   Tear of left hamstring 12/22/2020   Labral tear of left hip joint 12/22/2020   Acute blood loss anemia    Orthostatic hypotension 12/17/2020   History of CVA (cerebrovascular accident) 12/17/2020   Fall 12/15/2020   Hip hematoma, left, initial encounter 12/15/2020   Nondisplaced fracture of coronoid process of left ulna, initial encounter for closed fracture 12/15/2020   Multiple rib fractures 12/15/2020   COVID-19 virus infection 10/12/2020    Constipation 06/22/2020   Change in bowel habits 06/22/2020   Diverticulosis 06/22/2020   Dark stools 06/22/2020   Hemorrhoids 06/22/2020   Pacemaker 01/23/2020   Junctional rhythm 01/23/2020   Palpitations 01/23/2020   ICH (intracerebral hemorrhage) ([place]) 12/02/2019   A-fib ([place]) 12/02/2019   Anemia 12/02/2019   QT prolongation 12/02/2019   Hypothyroidism 12/02/2019   Gait abnormality 07/10/2018   S/P reverse total shoulder arthroplasty, 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   Persistent atrial fibrillation ([place])    Bilateral ureteral calculi 11/17/2017   Atrial fibrillation with RVR ([place]) 09/15/2017   Atrial flutter ([place]) 07/17/2017   Port catheter in place 04/02/2017   Non-Hodgkin lymphoma, unspecified, intrathoracic lymph nodes ([place]) 02/13/2017   Mediastinal mass 02/06/2017   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[place]) 02/06/2017   Abnormal chest x-ray    Lung mass 02/03/2017   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   OSA (obstructive sleep apnea) 02/03/2015   History of renal calculi 11/16/2014   Renal calculi 11/16/2014   Family history of colon cancer 10/26/2014   Mechanical complication due to cardiac pacemaker pulse generator 11/06/2013   Dyspnea 05/12/2013   Hypothyroidism 04/21/2013   Pleural effusion 04/19/2013   Long term (current) use of anticoagulants 12/20/2010   EPIDERMOID CYST 08/22/2010   Chronic diastolic heart failure ([place]) 08/06/2010   SYNCOPE AND COLLAPSE 07/27/2010   OSTEOARTHRITIS, KNEE, RIGHT 03/15/2010   Class 1 obesity due to excess calories with body mass index (BMI) of 33.0 to 33.9 in adult 12/07/2009   NONSPEC ELEVATION OF LEVELS OF TRANSAMINASE/LDH 09/08/2009   BREAST CANCER, HX OF 07/25/2009   COLONIC POLYPS, HX OF 07/25/2009   TUBULOVILLOUS ADENOMA, COLON 04/29/2008   HYPERGLYCEMIA, FASTING 04/29/2008   HYPERLIPIDEMIA 06/22/2007   CARCINOMA, THYROID GLAND, HX OF 06/22/2007    Janene Harvey, PT, DPT 09/28/21 10:55 AM   [facility] [address], [place], [place], [address] Phone: [phone]   Fax:  [phone]  Name: Kayla Harrison MRN: [id number] Date of Birth: 1946-08-08

## 2021-09-28 NOTE — Therapy (Signed)
[facility] [place], [place], [place], [address] Phone: [phone]   Fax:  [phone]  Occupational Therapy Treatment  Patient Details  Name: Kayla Harrison MRN: [id number] Date of Birth: 12-04-1945 Referring Provider (OT): Leonie Man   Encounter Date: 09/28/2021   OT End of Session - 09/28/21 0946     Visit Number 14    Number of Visits 25    Date for OT Re-Evaluation 11/07/21    Authorization Type Medicare A and B    Authorization - Visit Number 4    Authorization - Number of Visits 10    Progress Note Due on Visit 10    OT Start Time 1015    OT Stop Time 1059    OT Time Calculation (min) 44 min    Activity Tolerance Patient limited by fatigue    Behavior During Therapy WFL for tasks assessed/performed             Past Medical History:  Diagnosis Date   Anemia    Arthritis    osteoarthritis - knees and right shoulder   Blood transfusion without reported diagnosis    Breast cancer ([place])    Dr Margot Chimes, total thyroidectomy- 1999- for cancer   Brucellosis 1964   Chronic bilateral pleural effusions    Colon polyp    Dr Earlean Shawl   Complication of anesthesia    Ketamine produces LSD reaction, bright colored nightmarish experience    Dyslipidemia    Endometriosis    Fibroids    H/O pleural effusion    s/p thoracentesis w 3225ml withdrawn   Hepatitis    [place], ?hepatitis    History of dysphagia    due to radiation therapy   History of hiatal hernia    small noted on PET scan   Hypertension    Hypothyroidism    Lung cancer, lower lobe ([place]) 01/2017   radiation RX completed 03/04/17; will start chemo 6/27, pt unaware of lung cancer   Morbid obesity ([place])    Status post lap band surgery   Nephrolithiasis    Non Hodgkin's lymphoma ([place])    on chemotherapy   Persistent atrial fibrillation ([place])    a. s/p PVI 2008 b. s/p convergent ablation 3532 complicated by bradycardia  requiring pacemaker implant   Personal history of radiation therapy    Presence of permanent cardiac pacemaker    Rotator cuff tear    Right   Stroke ([place])    2003- [place] x2   SVC syndrome    with lung mass and non hodgkins lymphoma   Thyroid cancer ([place]) 2000    Past Surgical History:  Procedure Laterality Date   ABDOMINAL HYSTERECTOMY  1983   afib ablation     a. 2008 PVI b. 2014 convergent ablation   APPENDECTOMY     BONE MARROW BIOPSY  02/21/2017   BREAST LUMPECTOMY Left 2010   [name]     2015- negative   CARDIOVERSION  10/09/2012   Procedure: CARDIOVERSION;  Surgeon: Minus Breeding, MD;  Location: [place];  Service: Cardiovascular;  Laterality: N/A;   CARDIOVERSION  10/09/2012   Procedure: CARDIOVERSION;  Surgeon: Minus Breeding, MD;  Location: [facility] ENDOSCOPY;  Service: Cardiovascular;  Laterality: N/A;  [name] gave the ok to add pt to the add on , but we must check to find out if the can add pt on at [phone] ( [phone])  CARDIOVERSION N/A 11/20/2012   Procedure: CARDIOVERSION;  Surgeon: Fay Records, MD;  Location: [place];  Service: Cardiovascular;  Laterality: N/A;   CARDIOVERSION N/A 07/18/2017   Procedure: CARDIOVERSION;  Surgeon: Thayer Headings, MD;  Location: [place];  Service: Cardiovascular;  Laterality: N/A;   CARDIOVERSION N/A 10/03/2017   Procedure: CARDIOVERSION;  Surgeon: Sanda Klein, MD;  Location: [facility] ENDOSCOPY;  Service: Cardiovascular;  Laterality: N/A;   CARDIOVERSION N/A 01/07/2018   Procedure: CARDIOVERSION;  Surgeon: Acie Fredrickson Wonda Cheng, MD;  Location: [facility] ENDOSCOPY;  Service: Cardiovascular;  Laterality: N/A;   CARDIOVERSION N/A 12/10/2019   Procedure: CARDIOVERSION;  Surgeon: Buford Dresser, MD;  Location: [facility] ENDOSCOPY;  Service: Cardiovascular;  Laterality: N/A;   CARDIOVERSION N/A 03/09/2021   Procedure: CARDIOVERSION;  Surgeon: Pixie Casino, MD;  Location: [place];  Service: Cardiovascular;  Laterality:  N/A;   CHOLECYSTECTOMY     COLONOSCOPY W/ POLYPECTOMY     Dr Earlean Shawl   CYSTOSCOPY N/A 02/06/2015   Procedure: CYSTOSCOPY;  Surgeon: Kathie Rhodes, MD;  Location: [facility];  Service: Urology;  Laterality: N/A;   CYSTOSCOPY W/ RETROGRADES Left 11/17/2017   Procedure: CYSTOSCOPY WITH RETROGRADE /PYELOGRAM/;  Surgeon: Kathie Rhodes, MD;  Location: [facility];  Service: Urology;  Laterality: Left;   CYSTOSCOPY WITH RETROGRADE PYELOGRAM, URETEROSCOPY AND STENT PLACEMENT Right 02/06/2015   Procedure: RETROGRADE PYELOGRAM, RIGHT URETEROSCOPY STENT PLACEMENT;  Surgeon: Kathie Rhodes, MD;  Location: [facility];  Service: Urology;  Laterality: Right;   CYSTOSCOPY WITH RETROGRADE PYELOGRAM, URETEROSCOPY AND STENT PLACEMENT Right 03/07/2017   Procedure: CYSTOSCOPY WITH RIGHT RETROGRADE PYELOGRAM,RIGHT  URETEROSCOPYLASER LITHOTRIPSY  AND STENT PLACEMENT AND STONE BASKETRY;  Surgeon: Kathie Rhodes, MD;  Location: [facility];  Service: Urology;  Laterality: Right;   EYE SURGERY     cataract surgery   fatty mass removal  1999   pubic area   HOLMIUM LASER APPLICATION N/A 10/07/7260   Procedure: HOLMIUM LASER APPLICATION;  Surgeon: Kathie Rhodes, MD;  Location: [facility];  Service: Urology;  Laterality: N/A;   HOLMIUM LASER APPLICATION Right 0/35/5974   Procedure: HOLMIUM LASER APPLICATION;  Surgeon: Kathie Rhodes, MD;  Location: [facility];  Service: Urology;  Laterality: Right;   HOLMIUM LASER APPLICATION Left 1/63/8453   Procedure: HOLMIUM LASER APPLICATION;  Surgeon: Kathie Rhodes, MD;  Location: [facility];  Service: Urology;  Laterality: Left;   I & D EXTREMITY Left 12/19/2020   Procedure: IRRIGATION AND DEBRIDEMENT OF LEFT HIP HEMATOMA WITH APPLICATION OF WOUND VAC;  Surgeon: Erle Crocker, MD;  Location: [place];  Service: Orthopedics;  Laterality: Left;   I & D EXTREMITY Left 02/06/2021   Procedure: IRRIGATION AND DEBRIDEMENT DEEP ABCESS LEFT THIGH, SECONDARY CLOSURE OF WOUND DEHISCENCE;   Surgeon: Erle Crocker, MD;  Location: [place];  Service: Orthopedics;  Laterality: Left;   IR FLUORO GUIDE PORT INSERTION RIGHT  02/24/2017   IR NEPHROSTOMY PLACEMENT RIGHT  11/17/2017   IR PATIENT EVAL TECH 0-60 MINS  03/11/2017   IR REMOVAL TUN ACCESS W/ PORT W/O FL MOD SED  04/20/2018   IR US GUIDE VASC ACCESS RIGHT  02/24/2017   KNEE ARTHROSCOPY     bilateral   LAPAROSCOPIC GASTRIC BANDING  07/10/2010   LAPAROSCOPIC GASTRIC BANDING     Laparoscopic adjustable banding APS System with posterior hiatal hernia, 2 suture.   LAPAROTOMY     for ruptured ovary and ovarian artery    NEPHROLITHOTOMY Right 11/17/2017   Procedure: NEPHROLITHOTOMY PERCUTANEOUS;  Surgeon: Karsten Ro,  Elta Guadeloupe, MD;  Location: [facility];  Service: Urology;  Laterality: Right;   PACEMAKER INSERTION  03/10/2013   MDT dual chamber PPM   POCKET REVISION N/A 12/08/2013   Procedure: POCKET REVISION;  Surgeon: Deboraha Sprang, MD;  Location: [facility] CATH LAB;  Service: Cardiovascular;  Laterality: N/A;   PORTA CATH INSERTION     REVERSE SHOULDER ARTHROPLASTY Right 05/14/2018   Procedure: RIGHT REVERSE SHOULDER ARTHROPLASTY;  Surgeon: Tania Ade, MD;  Location: [place];  Service: Orthopedics;  Laterality: Right;   REVERSE SHOULDER REPLACEMENT Right 05/14/2018   RIGHT HEART CATH N/A 07/21/2019   Procedure: RIGHT HEART CATH;  Surgeon: Larey Dresser, MD;  Location: [place] CV LAB;  Service: Cardiovascular;  Laterality: N/A;   TEE WITH CARDIOVERSION  09/22/2017   TEE WITHOUT CARDIOVERSION N/A 10/03/2017   Procedure: TRANSESOPHAGEAL ECHOCARDIOGRAM (TEE);  Surgeon: Sanda Klein, MD;  Location: [facility] ENDOSCOPY;  Service: Cardiovascular;  Laterality: N/A;   TEE WITHOUT CARDIOVERSION N/A 08/04/2019   Procedure: TRANSESOPHAGEAL ECHOCARDIOGRAM (TEE);  Surgeon: Larey Dresser, MD;  Location: [facility] ENDOSCOPY;  Service: Cardiovascular;  Laterality: N/A;   TEE WITHOUT CARDIOVERSION N/A 12/10/2019   Procedure: TRANSESOPHAGEAL ECHOCARDIOGRAM (TEE);   Surgeon: Buford Dresser, MD;  Location: [facility] ENDOSCOPY;  Service: Cardiovascular;  Laterality: N/A;   TEE WITHOUT CARDIOVERSION N/A 03/09/2021   Procedure: TRANSESOPHAGEAL ECHOCARDIOGRAM (TEE);  Surgeon: Pixie Casino, MD;  Location: [place];  Service: Cardiovascular;  Laterality: N/A;   THYROIDECTOMY  1998   Dr Margot Chimes   TONSILLECTOMY     TOTAL KNEE ARTHROPLASTY  04/13/2012   Procedure: TOTAL KNEE ARTHROPLASTY;  Surgeon: Rudean Haskell, MD;  Location: [place];  Service: Orthopedics;  Laterality: Right;   VIDEO BRONCHOSCOPY WITH ENDOBRONCHIAL ULTRASOUND N/A 02/07/2017   Procedure: VIDEO BRONCHOSCOPY WITH ENDOBRONCHIAL ULTRASOUND;  Surgeon: Marshell Garfinkel, MD;  Location: [place];  Service: Pulmonary;  Laterality: N/A;    There were no vitals filed for this visit.   Subjective Assessment - 09/28/21 0945     Subjective  Pt reports concerns about her memory, with difficulty with money management especially writing checks and balancing check book.    Pertinent History h/o hyperlipidemia, atrial fibrillation with pacemaker on Eliquis, lymphoma, confusion, subcortical hemorrhage on the left with intraventricular extension    Currently in Pain? No/denies    Pain Onset In the past 7 days                             OT Treatments/Exercises (OP) - 09/28/21 0001       ADLs   ADL Comments reiterated discussion from prevoius session about  Discussed HHOT, SWK, home caregiver/aide, community engagement vs pursuing independent/assited living. Pt agreeable to [facility] referral to allow them to provide assistance/resources as needed to assist with ADLs, IADLs, and financial and medical management as well as social support as applicable.  Pt very receptive to discussion and agreeable to [facility] recommendation.      Cognitive Exercises   Organization Discussed scheduling and use of calendar for appt's.  Pt writing in calendar during sssion.  Pt only able to recall first part of scheduling  instructions and even stated "I find that I can remember the first part of an instruction, but frequenlty forget the 2nd/remaining part."      Neurological Re-education Exercises   Weight-Shifting Exercises - Outside Base of Support Dynamic standing balance with trunk rotation and reaching outside BOS.  Pt tolerated standing 7 mins  without fatigue.  Close supervision for standing balance.  Incorporated cognitive challenges with variety of sequencing and problem solving.    Reciprocal Movements Engaged in reaching and stepping in opposition to facilitate increased cognitive challenge.  Pt able to complete sequence of 2-3 with CGA and then loosing balance after 2-3 requiring min-mod assist to correct.  Educated on modified task to complete standing at counter to still facilitate weight shifting and opposition with stepping/reaching while allowing for improved positioning/safety.                      OT Short Term Goals - 09/28/21 0950       OT SHORT TERM GOAL #1   Title Pt will verbalize understanding of 3 energy conservastion strategies to increase success with IADLs.    Time 3    Period Weeks    Status On-going    Target Date 10/17/21      OT SHORT TERM GOAL #2   Title Pt will be Independent in completing HEP for activity tolerance and generalized strengthening to increase independence with ADLs and IADLs.    Time 3    Period Weeks    Status On-going               OT Long Term Goals - 09/28/21 0950       OT LONG TERM GOAL #1   Title Pt will be independent in energy conservation strategies.    Time 6    Period Weeks    Status On-going    Target Date 11/07/21      OT LONG TERM GOAL #2   Title Pt will complete 10 min moderately challenging task with no rest break to demonstrate increased endurance as needed to engage in ADLs and IADLs.    Time 6    Period Weeks    Status On-going      OT LONG TERM GOAL #3   Title Pt will look in to independent/assisted living  or alternative housing to assist with ADL and IADL needs.    Time 6    Period Weeks    Status On-going                   Plan - 09/28/21 0951     Clinical Impression Statement Pt demonstrated improved activity tolerance and balance this session, requiring increased assist with more dynamic reaching and opposition stepping task.  Pt receptive to [facility] recommendation for community resources and planning.  Pt continues to demonstrate cognitive impairments impacting safety in home.    OT Occupational Profile and History Detailed Assessment- Review of Records and additional review of physical, cognitive, psychosocial history related to current functional performance    Occupational performance deficits (Please refer to evaluation for details): ADL's;IADL's;Leisure;Social Participation    Body Structure / Function / Physical Skills ADL;Balance;Body mechanics;Cardiopulmonary status limiting activity;Coordination;Endurance;FMC;GMC;IADL;Mobility;Pain;ROM;Proprioception;Sensation;Strength;UE functional use;Vision    Psychosocial Skills Environmental  Adaptations;Routines and Behaviors    Rehab Potential Good    Clinical Decision Making Limited treatment options, no task modification necessary    Comorbidities Affecting Occupational Performance: May have comorbidities impacting occupational performance    Modification or Assistance to Complete Evaluation  No modification of tasks or assist necessary to complete eval    OT Frequency 2x / week    OT Duration 6 weeks    OT Treatment/Interventions Self-care/ADL training;Cryotherapy;Electrical Stimulation;Ultrasound;Moist Heat;Fluidtherapy;Therapeutic exercise;Neuromuscular education;Energy conservation;Manual Therapy;Functional Mobility Training;DME and/or AE instruction;Passive range of motion;Patient/family education;Visual/perceptual remediation/compensation;Cognitive remediation/compensation;Therapeutic activities;Balance training;Psychosocial skills  training;Coping strategies training    Plan Dynamic balance/mobility while addressing balance, dual tasking, and activity tolerance for IADLs    OT Home Exercise Plan dynamic standing balance (see pt instructions)    Consulted and Agree with Plan of Care Patient             Patient will benefit from skilled therapeutic intervention in order to improve the following deficits and impairments:   Body Structure / Function / Physical Skills: ADL, Balance, Body mechanics, Cardiopulmonary status limiting activity, Coordination, Endurance, FMC, GMC, IADL, Mobility, Pain, ROM, Proprioception, Sensation, Strength, UE functional use, Vision   Psychosocial Skills: Environmental  Adaptations, Routines and Behaviors   Visit Diagnosis: Unsteadiness on feet  Muscle weakness (generalized)  Other abnormalities of gait and mobility  Attention and concentration deficit    Problem List Patient Active Problem List   Diagnosis Date Noted   Hypertension    Pressure injury of skin 03/15/2021   Intracranial hemorrhage (HCC) 03/14/2021   Lower extremity edema    Polymicrobial bacterial infection    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    Enterococcus faecalis infection    Sepsis due to Escherichia coli (HCC)    Debility 02/13/2021   Infection of wound hematoma 02/05/2021   Physical debility 12/22/2020   Tear of left hamstring 12/22/2020   Labral tear of left hip joint 12/22/2020   Acute blood loss anemia    Orthostatic hypotension 12/17/2020   History of CVA (cerebrovascular accident) 12/17/2020   Fall 12/15/2020   Hip hematoma, left, initial encounter 12/15/2020   Nondisplaced fracture of coronoid process of left ulna, initial encounter for closed fracture 12/15/2020   Multiple rib fractures 12/15/2020   COVID-19 virus infection 10/12/2020   Constipation 06/22/2020   Change in bowel habits 06/22/2020   Diverticulosis 06/22/2020   Dark stools 06/22/2020   Hemorrhoids 06/22/2020    Pacemaker 01/23/2020   Junctional rhythm 01/23/2020   Palpitations 01/23/2020   ICH (intracerebral hemorrhage) (HCC) 12/02/2019   A-fib (HCC) 12/02/2019   Anemia 12/02/2019   QT prolongation 12/02/2019   Hypothyroidism 12/02/2019   Gait abnormality 07/10/2018   S/P reverse total shoulder arthroplasty, 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   Persistent atrial fibrillation (HCC)    Bilateral ureteral calculi 11/17/2017   Atrial fibrillation with RVR (HCC) 09/15/2017   Atrial flutter (HCC) 07/17/2017   Port catheter in place 04/02/2017   Non-Hodgkin lymphoma, unspecified, intrathoracic lymph nodes (HCC) 02/13/2017   Mediastinal mass 02/06/2017   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   Abnormal chest x-ray    Lung mass 02/03/2017   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   OSA (obstructive sleep apnea) 02/03/2015   History of renal calculi 11/16/2014   Renal calculi 11/16/2014   Family history of colon cancer 10/26/2014   Mechanical complication due to cardiac pacemaker pulse generator 11/06/2013   Dyspnea 05/12/2013   Hypothyroidism 04/21/2013   Pleural effusion 04/19/2013   Long term (current) use of anticoagulants 12/20/2010   EPIDERMOID CYST 08/22/2010   Chronic diastolic heart failure (HCC) 08/06/2010   SYNCOPE AND COLLAPSE 07/27/2010   OSTEOARTHRITIS, KNEE, RIGHT 03/15/2010   Class 1 obesity due to excess calories with body mass index (BMI) of 33.0 to 33.9 in adult 12/07/2009   NONSPEC ELEVATION OF LEVELS OF TRANSAMINASE/LDH 09/08/2009   BREAST CANCER, HX OF 07/25/2009   COLONIC POLYPS, HX OF 07/25/2009   TUBULOVILLOUS ADENOMA, COLON 04/29/2008   HYPERGLYCEMIA, FASTING 04/29/2008   HYPERLIPIDEMIA 06/22/2007   CARCINOMA, THYROID GLAND, HX OF 06/22/2007  Simonne Come, OT 09/28/2021, 12:08 PM  [facility] [place] [address] [place], [place], [address] Phone: [phone]   Fax:  [phone]  Name: DAYLIN EADS MRN:  [id number] Date of Birth: Nov 19, 1945

## 2021-10-01 ENCOUNTER — Ambulatory Visit: Payer: Medicare Other

## 2021-10-01 ENCOUNTER — Other Ambulatory Visit: Payer: Self-pay

## 2021-10-01 ENCOUNTER — Encounter: Payer: Self-pay | Admitting: Physical Therapy

## 2021-10-01 ENCOUNTER — Ambulatory Visit: Payer: Medicare Other | Admitting: Physical Therapy

## 2021-10-01 DIAGNOSIS — R41841 Cognitive communication deficit: Secondary | ICD-10-CM

## 2021-10-01 DIAGNOSIS — M6281 Muscle weakness (generalized): Secondary | ICD-10-CM | POA: Diagnosis not present

## 2021-10-01 DIAGNOSIS — R41840 Attention and concentration deficit: Secondary | ICD-10-CM | POA: Diagnosis not present

## 2021-10-01 DIAGNOSIS — R2689 Other abnormalities of gait and mobility: Secondary | ICD-10-CM | POA: Diagnosis not present

## 2021-10-01 DIAGNOSIS — R1310 Dysphagia, unspecified: Secondary | ICD-10-CM | POA: Diagnosis not present

## 2021-10-01 DIAGNOSIS — R2681 Unsteadiness on feet: Secondary | ICD-10-CM

## 2021-10-01 NOTE — Patient Instructions (Addendum)
SWALLOWING EXERCISES Do these until twice a day, for 8 weeks   Effortful Swallows - squeeze the muscles in your neck while you swallow your saliva or a sip of water - Repeat 10-15 times  Tongue Press  - press your tongue against the roof of your mouth for 3 seconds   - 10-15 times  [place] Swallow - swallow with your tongue sticking out - Stick tongue out past your lips and hold it there - Swallow, while holding your tongue out - Repeat 10-15 times *use a wet spoon if your mouth gets dry*       4.   Lip press  - Press your lips together HARD for 3 seconds  - Repeat 10 times

## 2021-10-01 NOTE — Therapy (Signed)
[facility] [address], [place], [place], [address] Phone: [phone]   Fax:  [phone]  Physical Therapy Treatment  Patient Details  Name: Kayla Harrison MRN: [id number] Date of Birth: [birth year] Referring Provider (PT): Garvin Fila, MD   Encounter Date: 10/01/2021   PT End of Session - 10/01/21 1247     Visit Number 13    Number of Visits 24    Date for PT Re-Evaluation 11/09/21    Authorization Type Medicare & MoO    PT Start Time 0928    PT Stop Time 1011    PT Time Calculation (min) 43 min    Equipment Utilized During Treatment Gait belt    Activity Tolerance Patient tolerated treatment well;Patient limited by fatigue    Behavior During Therapy WFL for tasks assessed/performed             Past Medical History:  Diagnosis Date   Anemia    Arthritis    osteoarthritis - knees and right shoulder   Blood transfusion without reported diagnosis    Breast cancer ([place])    Dr Margot Chimes, total thyroidectomy- 1999- for cancer   Brucellosis 1964   Chronic bilateral pleural effusions    Colon polyp    Dr Earlean Shawl   Complication of anesthesia    Ketamine produces LSD reaction, bright colored nightmarish experience    Dyslipidemia    Endometriosis    Fibroids    H/O pleural effusion    s/p thoracentesis w 3246m withdrawn   Hepatitis    [place], ?hepatitis    History of dysphagia    due to radiation therapy   History of hiatal hernia    small noted on PET scan   Hypertension    Hypothyroidism    Lung cancer, lower lobe ([place]   radiation RX completed 03/04/17; will start chemo 6/27, pt unaware of lung cancer   Morbid obesity ([place]    Status post lap band surgery   Nephrolithiasis    Non Hodgkin's lymphoma ([place]    on chemotherapy   Persistent atrial fibrillation ([place]    a. s/p PVI 2008 b. s/p convergent ablation 27741complicated by bradycardia requiring pacemaker  implant   Personal history of radiation therapy    Presence of permanent cardiac pacemaker    Rotator cuff tear    Right   [place]   SVC syndrome    with lung mass and non hodgkins lymphoma   Thyroid cancer ([place] 2000    Past Surgical History:  Procedure Laterality Date   ABDOMINAL HYSTERECTOMY  1983   afib ablation     a. 2008 PVI b. 2014 convergent ablation   APPENDECTOMY     BONE MARROW BIOPSY  02/21/2017   BREAST LUMPECTOMY Left 2010   [place]    2015- negative   CARDIOVERSION  10/09/2012   Procedure: CARDIOVERSION;  Surgeon: JMinus Breeding MD;  [place] [facility]  Service: Cardiovascular;  Laterality: N/A;   CARDIOVERSION  10/09/2012   Procedure: CARDIOVERSION;  Surgeon: JMinus Breeding MD;  [facility];  Service: Cardiovascular;  Laterality: N/A;  [name] the ok to add pt to the add on , but we must check to find out if the can add pt on at 1400 [phone]   CARDIOVERSION N/A 11/20/2012   Procedure: CARDIOVERSION;  Surgeon: PNevin Bloodgood  Alcus Dad, MD;  Location: [place];  Service: Cardiovascular;  Laterality: N/A;   CARDIOVERSION N/A 07/18/2017   Procedure: CARDIOVERSION;  Surgeon: Thayer Headings, MD;  Location: [place];  Service: Cardiovascular;  Laterality: N/A;   CARDIOVERSION N/A 10/03/2017   Procedure: CARDIOVERSION;  Surgeon: Sanda Klein, MD;  [facility];  Service: Cardiovascular;  Laterality: N/A;   CARDIOVERSION N/A 01/07/2018   Procedure: CARDIOVERSION;  Surgeon: Acie Fredrickson Wonda Cheng, MD;  Location: [facility] ENDOSCOPY;  Service: Cardiovascular;  Laterality: N/A;   CARDIOVERSION N/A 12/10/2019   Procedure: CARDIOVERSION;  Surgeon: Buford Dresser, MD;  Location: [facility]COPY;  Service: Cardiovascular;  Laterality: N/A;   CARDIOVERSION N/A 03/09/2021   Procedure: CARDIOVERSION;  Surgeon: Pixie Casino, MD;  Location: [place];  Service: Cardiovascular;  Laterality: N/A;    CHOLECYSTECTOMY     COLONOSCOPY W/ POLYPECTOMY     Dr Earlean Shawl   CYSTOSCOPY N/A 02/06/2015   Procedure: CYSTOSCOPY;  Surgeon: Kathie Rhodes, MD;  [facility];  Service: Urology;  Laterality: N/A;   CYSTOSCOPY W/ RETROGRADES Left 11/17/2017   Procedure: CYSTOSCOPY WITH RETROGRADE /PYELOGRAM/;  Surgeon: Kathie Rhodes, MD;  [facility];  Service: Urology;  Laterality: Left;   CYSTOSCOPY WITH RETROGRADE PYELOGRAM, URETEROSCOPY AND STENT PLACEMENT Right 02/06/2015   Procedure: RETROGRADE PYELOGRAM, RIGHT URETEROSCOPY STENT PLACEMENT;  Surgeon: Kathie Rhodes, MD;  [facility];  Service: Urology;  Laterality: Right;   CYSTOSCOPY WITH RETROGRADE PYELOGRAM, URETEROSCOPY AND STENT PLACEMENT Right 03/07/2017   Procedure: CYSTOSCOPY WITH RIGHT RETROGRADE PYELOGRAM,RIGHT  URETEROSCOPYLASER LITHOTRIPSY  AND STENT PLACEMENT AND STONE BASKETRY;  Surgeon: Kathie Rhodes, MD;  Location: [place];  Service: Urology;  Laterality: Right;   EYE SURGERY     cataract surgery   fatty mass removal  1999   pubic area   HOLMIUM LASER APPLICATION N/A 2/45/8099   Procedure: HOLMIUM LASER APPLICATION;  Surgeon: Kathie Rhodes, MD;  [facility];  Service: Urology;  Laterality: N/A;   HOLMIUM LASER APPLICATION Right 8/33/8250   Procedure: HOLMIUM LASER APPLICATION;  Surgeon: Kathie Rhodes, MD;  Location: [facility];  Service: Urology;  Laterality: Right;   HOLMIUM LASER APPLICATION Left 5/39/7673   Procedure: HOLMIUM LASER APPLICATION;  Surgeon: Kathie Rhodes, MD;  [facility];  Service: Urology;  Laterality: Left;   I & D EXTREMITY Left 12/19/2020   Procedure: IRRIGATION AND DEBRIDEMENT OF LEFT HIP HEMATOMA WITH APPLICATION OF WOUND VAC;  Surgeon: Erle Crocker, MD;  Location: [place];  Service: Orthopedics;  Laterality: Left;   I & D EXTREMITY Left 02/06/2021   Procedure: IRRIGATION AND DEBRIDEMENT DEEP ABCESS LEFT THIGH, SECONDARY CLOSURE OF WOUND DEHISCENCE;  Surgeon:  Erle Crocker, MD;  Location: [place];  Service: Orthopedics;  Laterality: Left;   IR FLUORO GUIDE PORT INSERTION RIGHT  02/24/2017   IR NEPHROSTOMY PLACEMENT RIGHT  11/17/2017   IR PATIENT EVAL TECH 0-60 MINS  03/11/2017   IR REMOVAL TUN ACCESS W/ PORT W/O FL MOD SED  04/20/2018   IR US GUIDE VASC ACCESS RIGHT  02/24/2017   KNEE ARTHROSCOPY     bilateral   LAPAROSCOPIC GASTRIC BANDING  07/10/2010   LAPAROSCOPIC GASTRIC BANDING     Laparoscopic adjustable banding APS System with posterior hiatal hernia, 2 suture.   LAPAROTOMY     for ruptured ovary and ovarian artery    NEPHROLITHOTOMY Right 11/17/2017   Procedure: NEPHROLITHOTOMY PERCUTANEOUS;  Surgeon: Kathie Rhodes, MD;  [facility];  Service: Urology;  Laterality: Right;   PACEMAKER INSERTION  03/10/2013   MDT dual chamber PPM   POCKET REVISION N/A 12/08/2013   Procedure: POCKET REVISION;  Surgeon: Deboraha Sprang, MD;  Location: [facility] CATH LAB;  Service: Cardiovascular;  Laterality: N/A;   PORTA CATH INSERTION     REVERSE SHOULDER ARTHROPLASTY Right 05/14/2018   Procedure: RIGHT REVERSE SHOULDER ARTHROPLASTY;  Surgeon: Tania Ade, MD;  Location: [place];  Service: Orthopedics;  Laterality: Right;   REVERSE SHOULDER REPLACEMENT Right 05/14/2018   RIGHT HEART CATH N/A 07/21/2019   Procedure: RIGHT HEART CATH;  Surgeon: Larey Dresser, MD;  Location: [place] CV LAB;  Service: Cardiovascular;  Laterality: N/A;   TEE WITH CARDIOVERSION  09/22/2017   TEE WITHOUT CARDIOVERSION N/A 10/03/2017   Procedure: TRANSESOPHAGEAL ECHOCARDIOGRAM (TEE);  Surgeon: Sanda Klein, MD;  Location: [facility] ENDOSCOPY;  Service: Cardiovascular;  Laterality: N/A;   TEE WITHOUT CARDIOVERSION N/A 08/04/2019   Procedure: TRANSESOPHAGEAL ECHOCARDIOGRAM (TEE);  Surgeon: Larey Dresser, MD;  Location: [facility]OSCOPY;  Service: Cardiovascular;  Laterality: N/A;   TEE WITHOUT CARDIOVERSION N/A 12/10/2019   Procedure: TRANSESOPHAGEAL ECHOCARDIOGRAM (TEE);  Surgeon:  Buford Dresser, MD;  Location: [facility] ENDOSCOPY;  Service: Cardiovascular;  Laterality: N/A;   TEE WITHOUT CARDIOVERSION N/A 03/09/2021   Procedure: TRANSESOPHAGEAL ECHOCARDIOGRAM (TEE);  Surgeon: Pixie Casino, MD;  Location: [place];  Service: Cardiovascular;  Laterality: N/A;   THYROIDECTOMY  1998   Dr Margot Chimes   TONSILLECTOMY     TOTAL KNEE ARTHROPLASTY  04/13/2012   Procedure: TOTAL KNEE ARTHROPLASTY;  Surgeon: Rudean Haskell, MD;  Location: [place];  Service: Orthopedics;  Laterality: Right;   VIDEO BRONCHOSCOPY WITH ENDOBRONCHIAL ULTRASOUND N/A 02/07/2017   Procedure: VIDEO BRONCHOSCOPY WITH ENDOBRONCHIAL ULTRASOUND;  Surgeon: Marshell Garfinkel, MD;  Location: [place];  Service: Pulmonary;  Laterality: N/A;    There were no vitals filed for this visit.   Subjective Assessment - 10/01/21 0930     Subjective Had a good weekend.    Pertinent History anemia, breast CA s/p surgery, lung CA s/p radiation and chemo, lymphoma on chemo, thyroid CA, HLD, hiatal hernia, HTN, a-fib, pacemaker, R reverse TSA 2019, stroke 2003, SVC syndrome, R TKA 2013    Diagnostic tests 03/15/21 head CT: Similar appearance of left caudothalamic hemorrhage with  intraventricular extension and mild adjacent edema. No new  hemorrhage. Unchanged mild trapping of the left lateral ventricle    Patient Stated Goals be able to get up off the floor    Currently in Pain? No/denies                               [facility] Adult PT Treatment/Exercise - 10/01/21 0001       Neuro Re-ed    Neuro Re-ed Details  R/L wt shift holding ball 10x; 2x10 alt toe tap on 4" step while holding ball, on 8" step with 1 UE support 2x10      Knee/Hip Exercises: Stretches   Gastroc Stretch Right;Left;30 seconds;2 reps    Gastroc Stretch Limitations runner's stretch   adjusted foot position     Knee/Hip Exercises: Aerobic   Nustep Level 4 all 4 extremities x 6 minutes with steps per minute >65      Knee/Hip  Exercises: Standing   Hip Flexion Stengthening;Right;Left;2 sets;10 reps    Hip Flexion Limitations resisted march with yellow TB in II bars      Knee/Hip Exercises: Seated  Sit to Sand 2 sets;5 reps;with UE support   pushing off knees                    PT Education - 10/01/21 1247     Education Details edu and review of updated HEP-Access Code: [email or web address]    Person(s) Educated Patient    Methods Explanation;Demonstration;Tactile cues;Verbal cues;Handout    Comprehension Verbalized understanding;Returned demonstration              PT Short Term Goals - 09/28/21 1050       PT SHORT TERM GOAL #1   Title Patient to be independent with initial HEP.    Time 3    Period Weeks    Status Achieved    Target Date 08/08/21               PT Long Term Goals - 09/28/21 1051       PT LONG TERM GOAL #1   Title Patient to be independent with advanced HEP.    Time 6    Period Weeks    Status On-going   limited compliance d/t medical concerns/issues   Target Date 11/09/21      PT LONG TERM GOAL #2   Title Patient to demonstrate B LE strength >/=4+/5.    Time 6    Period Weeks    Status Partially Met   weakness still remaining   Target Date 11/09/21      PT LONG TERM GOAL #3   Title Patient to score at least 46/56 on [place] in order to decrease risk of falls.    Time 6    Period Weeks    Status On-going   34/56   Target Date 11/09/21      PT LONG TERM GOAL #4   Title Patient to complete TUG in <14 sec with LRAD in order to decrease risk of falls.    Time 6    Period Weeks    Status On-going   18.7 sec with [facility]   Target Date 11/09/21      PT LONG TERM GOAL #5   Title Patient to demonstrate 5xSTS test in <15 sec in order to decrease risk of falls.    Time 6    Period Weeks    Status On-going   requires increased time d/t attempting without UEs; 50 sec   Target Date 11/09/21      PT LONG TERM GOAL #6   Title Patient to demonstrate safe floor transfer  with mod I.    Time 6    Period Weeks    Status Deferred   NT d/t safety   Target Date 11/09/21                   Plan - 10/01/21 1248     Clinical Impression Statement Patient arrived to session without new complaints. Updated HEP to address gait deviations. Patient was encouraged keep her handout on her fridge to assist in reminding her to increase her compliance. Patient required cues for set up with STS transfers; inconsistent performance evident, requiring increased practice. Limited weight shift evident with shifting activities, encouraging leading with hips rather than shoulders. Able to progress toe taps to taller step with good stability, thus able to safely perform this activity at home with UE support. Intermittent sitting rest breaks required today d/t fatigue. Patient without complaints at end of session.    Personal Factors and Comorbidities Age;Fitness;Comorbidity 3+;Transportation;Time since onset of  injury/illness/exacerbation;Past/Current Experience    Comorbidities anemia, breast CA s/p surgery, lung CA s/p radiation and chemo, lymphoma on chemo, thyroid CA, HLD, hiatal hernia, HTN, a-fib, pacemaker, R reverse TSA 2019, stroke 2003, SVC syndrome, R TKA 2013    Examination-Activity Limitations Bathing;Locomotion Level;Transfers;Bed Mobility;Reach Overhead;Bend;Carry;Squat;Dressing;Stairs;Stand;Hygiene/Grooming;Lift;Toileting    Examination-Participation Restrictions Laundry;Shop;Community Activity;Cleaning;Church;Meal Prep    Stability/Clinical Decision Making Stable/Uncomplicated    Rehab Potential Good    PT Frequency Other (comment)   1-2x   PT Duration 6 weeks    PT Treatment/Interventions ADLs/Self Care Home Management;Cryotherapy;Electrical Stimulation;DME Instruction;Moist Heat;Gait training;Stair training;Functional mobility training;Therapeutic activities;Therapeutic exercise;Balance training;Neuromuscular re-education;Manual techniques;Patient/family  education;Passive range of motion;Dry needling;Energy conservation;Vestibular;Vasopneumatic Device;Taping    PT Next Visit Plan balance training with SLS activities; progress LE stretching and strengthening to improve posture and gait deviations; continue gait training with SPC.  Encourage gait with RW; should we ask if family is available for any training/education to optimize carryover outside of therapy sessions?    Consulted and Agree with Plan of Care Patient             Patient will benefit from skilled therapeutic intervention in order to improve the following deficits and impairments:  Abnormal gait, Decreased range of motion, Difficulty walking, Increased fascial restricitons, Increased muscle spasms, Decreased safety awareness, Decreased endurance, Cardiopulmonary status limiting activity, Decreased activity tolerance, Decreased balance, Impaired flexibility, Improper body mechanics, Postural dysfunction, Decreased strength  Visit Diagnosis: Unsteadiness on feet  Muscle weakness (generalized)  Other abnormalities of gait and mobility     Problem List Patient Active Problem List   Diagnosis Date Noted   Hypertension    Pressure injury of skin 03/15/2021   Intracranial hemorrhage (HCC) 03/14/2021   Lower extremity edema    Polymicrobial bacterial infection    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    Enterococcus faecalis infection    Sepsis due to Escherichia coli ([place])    Debility 02/13/2021   Infection of wound hematoma 02/05/2021   Physical debility 12/22/2020   Tear of left hamstring 12/22/2020   Labral tear of left hip joint 12/22/2020   Acute blood loss anemia    Orthostatic hypotension 12/17/2020   History of CVA (cerebrovascular accident) 12/17/2020   Fall 12/15/2020   Hip hematoma, left, initial encounter 12/15/2020   Nondisplaced fracture of coronoid process of left ulna, initial encounter for closed fracture 12/15/2020   Multiple rib fractures  12/15/2020   COVID-19 virus infection 10/12/2020   Constipation 06/22/2020   Change in bowel habits 06/22/2020   Diverticulosis 06/22/2020   Dark stools 06/22/2020   Hemorrhoids 06/22/2020   Pacemaker 01/23/2020   Junctional rhythm 01/23/2020   Palpitations 01/23/2020   ICH (intracerebral hemorrhage) ([place]) 12/02/2019   A-fib ([place]) 12/02/2019   Anemia 12/02/2019   QT prolongation 12/02/2019   Hypothyroidism 12/02/2019   Gait abnormality 07/10/2018   S/P reverse total shoulder arthroplasty, 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   Persistent atrial fibrillation ([place])    Bilateral ureteral calculi 11/17/2017   Atrial fibrillation with RVR ([place]) 09/15/2017   Atrial flutter ([place]) 07/17/2017   Port catheter in place 04/02/2017   Non-Hodgkin lymphoma, unspecified, intrathoracic lymph nodes ([place]) 02/13/2017   Mediastinal mass 02/06/2017   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[place]) 02/06/2017   Abnormal chest x-ray    Lung mass 02/03/2017   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   OSA (obstructive sleep apnea) 02/03/2015   History of renal calculi 11/16/2014   Renal calculi 11/16/2014   Family history of colon cancer 10/26/2014   Mechanical complication  due to cardiac pacemaker pulse generator 11/06/2013   Dyspnea 05/12/2013   Hypothyroidism 04/21/2013   Pleural effusion 04/19/2013   Long term (current) use of anticoagulants 12/20/2010   EPIDERMOID CYST 08/22/2010   Chronic diastolic heart failure ([place]) 08/06/2010   SYNCOPE AND COLLAPSE 07/27/2010   OSTEOARTHRITIS, KNEE, RIGHT 03/15/2010   Class 1 obesity due to excess calories with body mass index (BMI) of 33.0 to 33.9 in adult 12/07/2009   NONSPEC ELEVATION OF LEVELS OF TRANSAMINASE/LDH 09/08/2009   BREAST CANCER, HX OF 07/25/2009   COLONIC POLYPS, HX OF 07/25/2009   TUBULOVILLOUS ADENOMA, COLON 04/29/2008   HYPERGLYCEMIA, FASTING 04/29/2008   HYPERLIPIDEMIA 06/22/2007   CARCINOMA, THYROID GLAND, HX OF 06/22/2007    Janene Harvey, PT,  DPT 10/01/21 12:50 PM   [facility] [address], [place], [place], [address] Phone: [phone]   Fax:  [phone]  Name: Kayla Harrison MRN: [id number] Date of Birth: 09/09/1946

## 2021-10-01 NOTE — Therapy (Signed)
[facility] [address], [place], [place], [address] Phone: [phone]   Fax:  [phone]  Speech Language Pathology Treatment  Patient Details  Name: Kayla Harrison MRN: [id number] Date of Birth: [birth year] Referring Provider (SLP): Royal Hawthorn, MD   Encounter Date: 10/01/2021   End of Session - 10/01/21 1228     Visit Number 2    Number of Visits 13    Date for SLP Re-Evaluation 11/23/21    SLP Start Time 9563    SLP Stop Time  1100    SLP Time Calculation (min) 42 min    Activity Tolerance Patient tolerated treatment well             Past Medical History:  Diagnosis Date   Anemia    Arthritis    osteoarthritis - knees and right shoulder   Blood transfusion without reported diagnosis    Breast cancer ([place])    Dr Margot Chimes, total thyroidectomy- 1999- for cancer   Brucellosis 1964   Chronic bilateral pleural effusions    Colon polyp    Dr Earlean Shawl   Complication of anesthesia    Ketamine produces LSD reaction, bright colored nightmarish experience    Dyslipidemia    Endometriosis    Fibroids    H/O pleural effusion    s/p thoracentesis w 3225m withdrawn   Hepatitis    [place], ?hepatitis    History of dysphagia    due to radiation therapy   History of hiatal hernia    small noted on PET scan   Hypertension    Hypothyroidism    Lung cancer, lower lobe ([place] 01/2017   radiation RX completed 03/04/17; will start chemo 6/27, pt unaware of lung cancer   Morbid obesity ([place]    Status post lap band surgery   Nephrolithiasis    Non Hodgkin's lymphoma ([place]    on chemotherapy   Persistent atrial fibrillation ([place]    a. s/p PVI 2008 b. s/p convergent ablation 28756complicated by bradycardia requiring pacemaker implant   Personal history of radiation therapy    Presence of permanent cardiac pacemaker    Rotator cuff tear    Right   [place]   SVC  syndrome    with lung mass and non hodgkins lymphoma   Thyroid cancer ([place] 2000    Past Surgical History:  Procedure Laterality Date   ABDOMINAL HYSTERECTOMY  1983   afib ablation     a. 2008 PVI b. 2014 convergent ablation   APPENDECTOMY     BONE MARROW BIOPSY  02/21/2017   BREAST LUMPECTOMY Left 2010   [place]    2015- negative   CARDIOVERSION  10/09/2012   Procedure: CARDIOVERSION;  Surgeon: JMinus Breeding MD;  [place] [facility]  Service: Cardiovascular;  Laterality: N/A;   CARDIOVERSION  10/09/2012   Procedure: CARDIOVERSION;  Surgeon: JMinus Breeding MD;  [facility]  Service: Cardiovascular;  Laterality: N/A;  [name] the ok to add pt to the add on , but we must check to find out if the can add pt on at 1400 [phone]   CARDIOVERSION N/A 11/20/2012   Procedure: CARDIOVERSION;  Surgeon: PFay Records MD;  [facility]  Service: Cardiovascular;  Laterality: N/A;   CARDIOVERSION N/A 07/18/2017   Procedure: CARDIOVERSION;  Surgeon: NThayer Headings MD;  Location:  [place] ENDOSCOPY;  Service: Cardiovascular;  Laterality: N/A;   CARDIOVERSION N/A 10/03/2017   Procedure: CARDIOVERSION;  Surgeon: Sanda Klein, MD;  Location: [place] ENDOSCOPY;  Service: Cardiovascular;  Laterality: N/A;   CARDIOVERSION N/A 01/07/2018   Procedure: CARDIOVERSION;  Surgeon: Thayer Headings, MD;  Location[facility] END[facility]COPY;  Service: Cardiovascular;  Laterality: N/A;   CARDIOVERSION N/A 12/10/2019   Procedure: CARDIOVERSION;  Surgeon: Buford Dresser, MD;  Location: [facility] ENDOSCOPY;  Service: Cardiovascular;  Laterality: N/A;   CARDIOVERSION N/A 03/09/2021   Procedure: CARDIOVERSION;  Surgeon: Pixie Casino, MD;  Location: [place];  Service: Cardiovascular;  Laterality: N/A;   CHOLECYSTECTOMY     COLONOSCOPY W/ POLYPECTOMY     Dr Earlean Shawl   CYSTOSCOPY N/A 02/06/2015   Procedure: CYSTOSCOPY;  Surgeon: Kathie Rhodes, MD;  [facility];  Service: Urology;   Laterality: N/A;   CYSTOSCOPY W/ RETROGRADES Left 11/17/2017   Procedure: CYSTOSCOPY WITH RETROGRADE /PYELOGRAM/;  Surgeon: Kathie Rhodes, MD;  [facility];  Service: Urology;  Laterality: Left;   CYSTOSCOPY WITH RETROGRADE PYELOGRAM, URETEROSCOPY AND STENT PLACEMENT Right 02/06/2015   Procedure: RETROGRADE PYELOGRAM, RIGHT URETEROSCOPY STENT PLACEMENT;  Surgeon: Kathie Rhodes, MD;  [facility];  Service: Urology;  Laterality: Right;   CYSTOSCOPY WITH RETROGRADE PYELOGRAM, URETEROSCOPY AND STENT PLACEMENT Right 03/07/2017   Procedure: CYSTOSCOPY WITH RIGHT RETROGRADE PYELOGRAM,RIGHT  URETEROSCOPYLASER LITHOTRIPSY  AND STENT PLACEMENT AND STONE BASKETRY;  Surgeon: Kathie Rhodes, MD;  Location: [place];  Service: Urology;  Laterality: Right;   EYE SURGERY     cataract surgery   fatty mass removal  1999   pubic area   HOLMIUM LASER APPLICATION N/A 3/88/8280   Procedure: HOLMIUM LASER APPLICATION;  Surgeon: Kathie Rhodes, MD;  [facility];  Service: Urology;  Laterality: N/A;   HOLMIUM LASER APPLICATION Right 0/34/9179   Procedure: HOLMIUM LASER APPLICATION;  Surgeon: Kathie Rhodes, MD;  Location: [facility];  Service: Urology;  Laterality: Right;   HOLMIUM LASER APPLICATION Left 1/50/5697   Procedure: HOLMIUM LASER APPLICATION;  Surgeon: Kathie Rhodes, MD;  [facility];  Service: Urology;  Laterality: Left;   I & D EXTREMITY Left 12/19/2020   Procedure: IRRIGATION AND DEBRIDEMENT OF LEFT HIP HEMATOMA WITH APPLICATION OF WOUND VAC;  Surgeon: Erle Crocker, MD;  Location: [place];  Service: Orthopedics;  Laterality: Left;   I & D EXTREMITY Left 02/06/2021   Procedure: IRRIGATION AND DEBRIDEMENT DEEP ABCESS LEFT THIGH, SECONDARY CLOSURE OF WOUND DEHISCENCE;  Surgeon: Erle Crocker, MD;  Location: [place];  Service: Orthopedics;  Laterality: Left;   IR FLUORO GUIDE PORT INSERTION RIGHT  02/24/2017   IR NEPHROSTOMY PLACEMENT RIGHT  11/17/2017   IR  PATIENT EVAL TECH 0-60 MINS  03/11/2017   IR REMOVAL TUN ACCESS W/ PORT W/O FL MOD SED  04/20/2018   IR US GUIDE VASC ACCESS RIGHT  02/24/2017   KNEE ARTHROSCOPY     bilateral   LAPAROSCOPIC GASTRIC BANDING  07/10/2010   LAPAROSCOPIC GASTRIC BANDING     Laparoscopic adjustable banding APS System with posterior hiatal hernia, 2 suture.   LAPAROTOMY     for ruptured ovary and ovarian artery    NEPHROLITHOTOMY Right 11/17/2017   Procedure: NEPHROLITHOTOMY PERCUTANEOUS;  Surgeon: Kathie Rhodes, MD;  [facility];  Service: Urology;  Laterality: Right;   PACEMAKER INSERTION  03/10/2013   MDT dual chamber PPM   POCKET REVISION N/A 12/08/2013   Procedure: POCKET REVISION;  Surgeon: Deboraha Sprang,  MD;  Location: [place] CATH LAB;  Service: Cardiovascular;  Laterality: N/A;   PORTA CATH INSERTION     REVERSE SHOULDER ARTHROPLASTY Right 05/14/2018   Procedure: RIGHT REVERSE SHOULDER ARTHROPLASTY;  Surgeon: Tania Ade, MD;  Location: [place];  Service: Orthopedics;  Laterality: Right;   REVERSE SHOULDER REPLACEMENT Right 05/14/2018   RIGHT HEART CATH N/A 07/21/2019   Procedure: RIGHT HEART CATH;  Surgeon: Larey Dresser, MD;  Location: [place] CV LAB;  Service: Cardiovascular;  Laterality: N/A;   TEE WITH CARDIOVERSION  09/22/2017   TEE WITHOUT CARDIOVERSION N/A 10/03/2017   Procedure: TRANSESOPHAGEAL ECHOCARDIOGRAM (TEE);  Surgeon: Sanda Klein, MD;  Location: [facility] ENDOSCOPY;  Service: Cardiovascular;  Laterality: N/A;   TEE WITHOUT CARDIOVERSION N/A 08/04/2019   Procedure: TRANSESOPHAGEAL ECHOCARDIOGRAM (TEE);  Surgeon: Larey Dresser, MD;  Location: [facility] ENDOSCOPY;  Service: Cardiovascular;  Laterality: N/A;   TEE WITHOUT CARDIOVERSION N/A 12/10/2019   Procedure: TRANSESOPHAGEAL ECHOCARDIOGRAM (TEE);  Surgeon: Buford Dresser, MD;  [facility];  Service: Cardiovascular;  Laterality: N/A;   TEE WITHOUT CARDIOVERSION N/A 03/09/2021   Procedure: TRANSESOPHAGEAL ECHOCARDIOGRAM  (TEE);  Surgeon: Pixie Casino, MD;  Location: [place];  Service: Cardiovascular;  Laterality: N/A;   THYROIDECTOMY  1998   Dr Margot Chimes   TONSILLECTOMY     TOTAL KNEE ARTHROPLASTY  04/13/2012   Procedure: TOTAL KNEE ARTHROPLASTY;  Surgeon: Rudean Haskell, MD;  Location: [place];  Service: Orthopedics;  Laterality: Right;   VIDEO BRONCHOSCOPY WITH ENDOBRONCHIAL ULTRASOUND N/A 02/07/2017   Procedure: VIDEO BRONCHOSCOPY WITH ENDOBRONCHIAL ULTRASOUND;  Surgeon: Marshell Garfinkel, MD;  Location: [place];  Service: Pulmonary;  Laterality: N/A;    There were no vitals filed for this visit.   Subjective Assessment - 10/01/21 1027     Subjective Last week with OT pt reported difficulty swallowing - coughing with liquids.    Currently in Pain? No/denies                   ADULT SLP TREATMENT - 10/01/21 1028       Treatment Provided   Treatment provided Dysphagia      Dysphagia Treatment   Temperature Spikes Noted No    Patient observed directly with PO's Yes    Type of PO's observed Thin liquids    Liquids provided via Cup    Oral Phase Signs & Symptoms Other (comment)   none noted today   Pharyngeal Phase Signs & Symptoms Other (comment)   no overt s/sx pharyngeal deficits noted today with small sips   Other treatment/comments In OT session last week pt mentioned she had difficulty swallowing liquids; pt stated ST did not work on her swallowing in the past- SLP could not find evidence of ST working on swallowing while an inpatient (i.e., chart corroberates pt's report). [place] told SLP as long as she modifies her sip size to smaller amount, she only occasionally coughs when she takes too large a sip. Today, pt modified her sip size adequately so she did not exhibit cough with liquids x11 sips. SLP provided pt some general swallow exercises and educated pt on proper procedure, and provided checklist for each day for tracking completion. SLP taught pt proper procedure and pt performed  each of 4 exercises correctly. SLP to cont to assess pt accuracy with HEP, adn may add more exercises as SLP feels comfrotable with pt mastering these already provided today. At this time, SLP does not feel it is necessary for pt to undergo modified (  MBS) for objective/instrumental assessment.      Assessment / Recommendations / Plan   Plan Goals updated      Dysphagia Recommendations   Diet recommendations Regular;Thin liquid    Compensations Small sips/bites      Progression Toward Goals   Progression toward goals --   added goal for dysphagia HEP; sent new POC for MD to sign adding dysphagia as dx             SLP Education - 10/01/21 1212     Education Details HEP procedure, cont to modify sip size, pt should regain more strength and see reduction in frequency of cough with liquids over the next 8-12 weeks.    Person(s) Educated Patient    Methods Explanation    Comprehension Verbalized understanding              SLP Short Term Goals - 10/01/21 1230       SLP SHORT TERM GOAL #1   Title pt will track medication administration with a medication tracking sheet 80% of the time    Time 4    Period Weeks    Status On-going    Target Date 10/26/21      SLP SHORT TERM GOAL #2   Title pt will verbalize 3 deficts/changes in thinking skills compared to 12 months ago in 2 sessions    Time 4    Period Weeks    Status On-going    Target Date 10/26/21      SLP SHORT TERM GOAL #3   Title pt will complete an 8 minute therapy task x3 in 2 sessions    Time 4    Period Weeks    Status On-going    Target Date 10/26/21      SLP SHORT TERM GOAL #4   Title pt will demonstrate emergent awareness when writing checks with occasional min A from SLP    Time 4    Period Weeks    Status On-going    Target Date 10/26/21      SLP SHORT TERM GOAL #5   Title pt will perform HEP with occasional min A (for dysphagia)    Time 4    Period Weeks    Status New    Target Date 10/26/21               SLP Long Term Goals - 10/01/21 1231       SLP LONG TERM GOAL #1   Title pt will demo emergent awareness with detailed therapy tasks by correcting her errors for 100% accuracy with modified independence (extra time, double-checking, etc) in 3 sessions    Time 6    Period Weeks    Status On-going    Target Date 11/09/21      SLP LONG TERM GOAL #2   Title pt will demo memory and emergent awarenss adequate to track medication administration for 10 consecutive days without an error    Time 6    Period Weeks    Status On-going      SLP LONG TERM GOAL #3   Title pt will demo sustained and selective attention sufficient to complete a 10 minute therapy task x2, in 2 sessions    Time 6    Period Weeks    Status On-going    Target Date 11/09/21      SLP LONG TERM GOAL #4   Title pt will demo anticipatory awareness by independently double-checking therapy tasks 100% of  the time in 3 sessions    Time 6    Period Weeks    Status On-going    Target Date 11/09/21      SLP LONG TERM GOAL #5   Title pt will perform swallowing HEP with rare min A over 2 sessions    Time 6    Period Weeks    Status New    Target Date 11/09/21              Plan - 10/01/21 1229     Clinical Impression Statement Quatisha presents today with some cognitive communication deficits in the areas of awareness (intellectual, emergent, and anticapatory), memory, and likely some component of attention. She also demonstrated decr'd processing speed today and told SLP during word generation "It's so hard for me to come up with these quickly" and affirmed that she would have generated more quickly 5 years ago. SLP reiterated these changes are due to her CVA. Pt has been known to rarely arrive early or late for PT and/or OT appointment times stating she looked at her calendar incorrectly. Additionally, today pt's [facility] called during OT session informing pt she filled out checks to them  incorrectly (did not sign one and put incorrect figure for check.) SLP believes pt would benefit from skilled ST targeting organization, memory, awareness, and some attention skills in order to improve pt's independence adn safety living alone. Today SLP worked with pt with swallowing due to reporting to OT that she was coughing with liquids. See daily note for details. Goal/s added for dysphagia. 2/week was recommended however pt states she thinks she can only attend ST x1/week at this time due to stamina, so she will be scheduled at x1/week with hopes during this 6 week plan of care that she can incr to x2/week.    Speech Therapy Frequency 2x / week    Duration --   6 weeks, however pt states once/week is preferred   Treatment/Interventions Environmental controls;Functional tasks;SLP instruction and feedback;Cueing hierarchy;Cognitive reorganization;Compensatory strategies;Internal/external aids;Patient/family education    Potential to Achieve Goals Fair    Potential Considerations Cooperation/participation level;Severity of impairments;Family/community support;Ability to learn/carryover information    Consulted and Agree with Plan of Care Patient             Patient will benefit from skilled therapeutic intervention in order to improve the following deficits and impairments:   Dysphagia, unspecified type  Cognitive communication deficit    Problem List Patient Active Problem List   Diagnosis Date Noted   Hypertension    Pressure injury of skin 03/15/2021   Intracranial hemorrhage ([place]) 03/14/2021   Lower extremity edema    Polymicrobial bacterial infection    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    Enterococcus faecalis infection    Sepsis due to Escherichia coli ([place])    Debility 02/13/2021   Infection of wound hematoma 02/05/2021   Physical debility 12/22/2020   Tear of left hamstring 12/22/2020   Labral tear of left hip joint 12/22/2020   Acute blood loss anemia     Orthostatic hypotension 12/17/2020   History of CVA (cerebrovascular accident) 12/17/2020   Fall 12/15/2020   Hip hematoma, left, initial encounter 12/15/2020   Nondisplaced fracture of coronoid process of left ulna, initial encounter for closed fracture 12/15/2020   Multiple rib fractures 12/15/2020   COVID-19 virus infection 10/12/2020   Constipation 06/22/2020   Change in bowel habits 06/22/2020   Diverticulosis 06/22/2020   Dark stools 06/22/2020  Hemorrhoids 06/22/2020   Pacemaker 01/23/2020   Junctional rhythm 01/23/2020   Palpitations 01/23/2020   ICH (intracerebral hemorrhage) ([place]) 12/02/2019   A-fib ([place]) 12/02/2019   Anemia 12/02/2019   QT prolongation 12/02/2019   Hypothyroidism 12/02/2019   Gait abnormality 07/10/2018   S/P reverse total shoulder arthroplasty, 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   Persistent atrial fibrillation ([place])    Bilateral ureteral calculi 11/17/2017   Atrial fibrillation with RVR ([place]) 09/15/2017   Atrial flutter ([place]) 07/17/2017   Port catheter in place 04/02/2017   Non-Hodgkin lymphoma, unspecified, intrathoracic lymph nodes ([place]) 02/13/2017   Mediastinal mass 02/06/2017   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/2017   Abnormal chest x-ray    Lung mass 02/03/2017   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   OSA (obstructive sleep apnea) 02/03/2015   History of renal calculi 11/16/2014   Renal calculi 11/16/2014   Family history of colon cancer 10/26/2014   Mechanical complication due to cardiac pacemaker pulse generator 11/06/2013   Dyspnea 05/12/2013   Hypothyroidism 04/21/2013   Pleural effusion 04/19/2013   Long term (current) use of anticoagulants 12/20/2010   EPIDERMOID CYST 08/22/2010   Chronic diastolic heart failure ([place]) 08/06/2010   SYNCOPE AND COLLAPSE 07/27/2010   OSTEOARTHRITIS, KNEE, RIGHT 03/15/2010   Class 1 obesity due to excess calories with body mass index (BMI) of 33.0 to 33.9 in adult 12/07/2009   NONSPEC ELEVATION OF LEVELS  OF TRANSAMINASE/LDH 09/08/2009   BREAST CANCER, HX OF 07/25/2009   COLONIC POLYPS, HX OF 07/25/2009   TUBULOVILLOUS ADENOMA, COLON 04/29/2008   HYPERGLYCEMIA, FASTING 04/29/2008   HYPERLIPIDEMIA 06/22/2007   CARCINOMA, THYROID GLAND, HX OF 06/22/2007    [place], CCC-SLP 10/01/2021, 12:32 PM  [facility] [address] [place], [place], [address] Phone: [phone]   Fax:  [phone]   Name: PAM VANALSTINE MRN: [id number] Date of Birth: 13-Mar-1946

## 2021-10-03 ENCOUNTER — Other Ambulatory Visit: Payer: Self-pay | Admitting: Internal Medicine

## 2021-10-03 ENCOUNTER — Telehealth: Payer: Self-pay | Admitting: *Deleted

## 2021-10-03 DIAGNOSIS — I4819 Other persistent atrial fibrillation: Secondary | ICD-10-CM

## 2021-10-03 NOTE — Telephone Encounter (Signed)
° °  Telephone encounter was:  Unsuccessful.  10/03/2021 Name: AMARIYAH BAZAR MRN: [id number] DOB: 02/03/1946  Unsuccessful outbound call made today to assist with:   Social interaction  Outreach Attempt:  1st Attempt  Mailbox is full   [place], Care Management  [phone] [address] , [place] [address] Email : [name]

## 2021-10-04 ENCOUNTER — Ambulatory Visit (HOSPITAL_COMMUNITY)
Admission: RE | Admit: 2021-10-04 | Discharge: 2021-10-04 | Disposition: A | Discharge status: Home / Self Care | Payer: Medicare Other | Attending: Cardiology | Admitting: Cardiology

## 2021-10-04 ENCOUNTER — Other Ambulatory Visit: Payer: Self-pay

## 2021-10-04 ENCOUNTER — Encounter (HOSPITAL_COMMUNITY)
Admission: RE | Disposition: A | Discharge status: Home / Self Care | Payer: Self-pay | Source: Home / Self Care | Attending: Cardiology

## 2021-10-04 ENCOUNTER — Ambulatory Visit (HOSPITAL_BASED_OUTPATIENT_CLINIC_OR_DEPARTMENT_OTHER): Payer: Medicare Other

## 2021-10-04 ENCOUNTER — Ambulatory Visit (HOSPITAL_COMMUNITY): Payer: Medicare Other | Admitting: Certified Registered Nurse Anesthetist

## 2021-10-04 ENCOUNTER — Encounter (HOSPITAL_COMMUNITY): Payer: Self-pay | Admitting: Cardiology

## 2021-10-04 DIAGNOSIS — Z9989 Dependence on other enabling machines and devices: Secondary | ICD-10-CM | POA: Diagnosis not present

## 2021-10-04 DIAGNOSIS — Z950 Presence of cardiac pacemaker: Secondary | ICD-10-CM | POA: Insufficient documentation

## 2021-10-04 DIAGNOSIS — I4892 Unspecified atrial flutter: Secondary | ICD-10-CM | POA: Diagnosis not present

## 2021-10-04 DIAGNOSIS — I340 Nonrheumatic mitral (valve) insufficiency: Secondary | ICD-10-CM

## 2021-10-04 DIAGNOSIS — E039 Hypothyroidism, unspecified: Secondary | ICD-10-CM | POA: Diagnosis not present

## 2021-10-04 DIAGNOSIS — I10 Essential (primary) hypertension: Secondary | ICD-10-CM | POA: Diagnosis not present

## 2021-10-04 DIAGNOSIS — R5383 Other fatigue: Secondary | ICD-10-CM | POA: Insufficient documentation

## 2021-10-04 DIAGNOSIS — Z8673 Personal history of transient ischemic attack (TIA), and cerebral infarction without residual deficits: Secondary | ICD-10-CM | POA: Insufficient documentation

## 2021-10-04 DIAGNOSIS — I951 Orthostatic hypotension: Secondary | ICD-10-CM | POA: Diagnosis not present

## 2021-10-04 DIAGNOSIS — R0609 Other forms of dyspnea: Secondary | ICD-10-CM | POA: Insufficient documentation

## 2021-10-04 DIAGNOSIS — I081 Rheumatic disorders of both mitral and tricuspid valves: Secondary | ICD-10-CM | POA: Diagnosis not present

## 2021-10-04 DIAGNOSIS — G4733 Obstructive sleep apnea (adult) (pediatric): Secondary | ICD-10-CM | POA: Diagnosis not present

## 2021-10-04 DIAGNOSIS — I4819 Other persistent atrial fibrillation: Secondary | ICD-10-CM | POA: Insufficient documentation

## 2021-10-04 DIAGNOSIS — I4891 Unspecified atrial fibrillation: Secondary | ICD-10-CM | POA: Diagnosis not present

## 2021-10-04 HISTORY — PX: TEE WITHOUT CARDIOVERSION: SHX5443

## 2021-10-04 HISTORY — PX: CARDIOVERSION: SHX1299

## 2021-10-04 LAB — POCT I-STAT, CHEM 8
BUN: 20 mg/dL (ref 8–23)
Calcium, Ion: 1.15 mmol/L (ref 1.15–1.40)
Chloride: 106 mmol/L (ref 98–111)
Creatinine, Ser: 1 mg/dL (ref 0.44–1.00)
Glucose, Bld: 96 mg/dL (ref 70–99)
HCT: 35 % — ABNORMAL LOW (ref 36.0–46.0)
Hemoglobin: 11.9 g/dL — ABNORMAL LOW (ref 12.0–15.0)
Potassium: 4.5 mmol/L (ref 3.5–5.1)
Sodium: 141 mmol/L (ref 135–145)
TCO2: 26 mmol/L (ref 22–32)

## 2021-10-04 LAB — ECHO TEE
MV M vel: 4.93 m/s
MV Peak grad: 97.2 mmHg
Radius: 0.6 cm

## 2021-10-04 SURGERY — ECHOCARDIOGRAM, TRANSESOPHAGEAL
Anesthesia: General

## 2021-10-04 MED ORDER — SODIUM CHLORIDE 0.9 % IV SOLN
INTRAVENOUS | Status: DC
  Administered 2021-10-04: 500 mL via INTRAVENOUS

## 2021-10-04 MED ORDER — PROPOFOL 500 MG/50ML IV EMUL
INTRAVENOUS | Status: DC | PRN
  Administered 2021-10-04: 100 ug/kg/min via INTRAVENOUS

## 2021-10-04 MED ORDER — LIDOCAINE 2% (20 MG/ML) 5 ML SYRINGE
INTRAMUSCULAR | Status: DC | PRN
  Administered 2021-10-04: 25 mg via INTRAVENOUS

## 2021-10-04 MED ORDER — PHENYLEPHRINE 40 MCG/ML (10ML) SYRINGE FOR IV PUSH (FOR BLOOD PRESSURE SUPPORT)
PREFILLED_SYRINGE | INTRAVENOUS | Status: DC | PRN
  Administered 2021-10-04 (×2): 80 ug via INTRAVENOUS

## 2021-10-04 NOTE — H&P (Signed)
Patient Care Team: Adrian Prince, MD as PCP - General (Endocrinology) Duke Salvia, MD as PCP - Cardiology (Cardiology) Duke Salvia, MD as PCP - Electrophysiology (Cardiology) Adrian Prince, MD (Endocrinology)     HPI   AYLLA HUFFINE is a 76 y.o. female Seen in followup for a pacemaker implanted at [facility] and atrial fibrillation.  She underwent pulmonary vein isolation at [facility] and had recurrent atrial fibrillation and underwent convergent ablation at [facility] 6/14; it was complicated by heart block prompting pacemaker implantation.   She has had recurrent aFib and  was restarted on amio--and then stopped 2/2 worsening SOB amiodarone resumed and then adjunctive ranolazine   She has a history of prior strokes and remains on oral anticoagulation .   Symptomatic orthostasis; tried an abdominal binder without improvement.   Fall 3/22 2/2 orthostasis assoc with labral hamstring tendon tear, repair of which was cx by large hematoma requiring VAC and I&D; subsequent dehiscence and abscess requiring another I&D    6/22 admitted with altered mental status >> CT- acute intracranial hemorrhage thought 2/2 hypertension , anticoagulation was reversed and has not resumed; has been maintained on amio    Discharged to rehab and then home.     She is gradually getting stronger.  Has struggled with extreme fatigue taking 2-3 naps a day now but previously was taking 3 or 4. Scant lightheadedness with standing.  Mild shortness of breath.  No chest pain.           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  11/20 TEE 55%  moderate MR/TR mild MS  6/22 Echo  55-60%        Remote morbid obesity status>>  post lap band surgery      Thromboembolic risk factors ( age -55 , TIA/CVA-2, Gender-1) for a CHADSVASc Score of 5         Past Medical History:  Diagnosis Date   Anemia     Arthritis      osteoarthritis - knees and right shoulder   Blood transfusion without reported diagnosis     Breast cancer (HCC)      Dr Jamey Ripa, total thyroidectomy- 1999- for cancer   Brucellosis 1964   Chronic bilateral pleural effusions     Colon polyp      Dr Kinnie Scales   Complication of anesthesia      Ketamine produces LSD reaction, bright colored nightmarish experience    Dyslipidemia     Endometriosis     Fibroids     H/O pleural effusion      s/p thoracentesis w withdrawn   Hepatitis  [place], ?hepatitis    History of dysphagia      due to radiation therapy   History of hiatal hernia      small noted on PET scan   Hypertension     Hypothyroidism     Lung cancer, lower lobe ([place]) 01/2017    radiation RX completed 03/04/17; will start chemo 6/27, pt unaware of lung cancer   Morbid obesity ([place])      Status post lap band surgery   Nephrolithiasis     Non Hodgkin's lymphoma ([place])      on chemotherapy   Persistent atrial fibrillation ([place])      a. s/p PVI 2008 b. s/p convergent ablation 3016 complicated by bradycardia requiring pacemaker implant   Personal history of radiation therapy     Presence of permanent cardiac pacemaker     Rotator cuff tear      Right   Stroke ([place])      2003- [place] x2   SVC syndrome      with lung mass and non hodgkins lymphoma   Thyroid cancer ([place]) 2000           Past Surgical History:  Procedure Laterality Date   ABDOMINAL HYSTERECTOMY   1983   afib ablation        a. 2008 PVI b. 2014 convergent ablation   APPENDECTOMY       BONE MARROW BIOPSY   02/21/2017   BREAST  LUMPECTOMY Left 2010   [place]        2015- negative   CARDIOVERSION   10/09/2012    Procedure: CARDIOVERSION;  Surgeon: Minus Breeding, MD;  Location: [place];  Service: Cardiovascular;  Laterality: N/A;   CARDIOVERSION   10/09/2012    Procedure: CARDIOVERSION;  Surgeon: Minus Breeding, MD;  Location: [facility] ENDOSCOPY;  Service: Cardiovascular;  Laterality: N/A;  [name] gave the ok to add pt to the add on , but we must check to find out if the can add pt on at 1400 [phone])   CARDIOVERSION N/A 11/20/2012    Procedure: CARDIOVERSION;  Surgeon: Fay Records, MD;  Location: [place];  Service: Cardiovascular;  Laterality: N/A;   CARDIOVERSION N/A 07/18/2017    Procedure: CARDIOVERSION;  Surgeon: Thayer Headings, MD;  Location: [place];  Service: Cardiovascular;  Laterality: N/A;   CARDIOVERSION N/A 10/03/2017    Procedure: CARDIOVERSION;  Surgeon: Sanda Klein, MD;  Location: [place];  Service: Cardiovascular;  Laterality: N/A;   CARDIOVERSION N/A 01/07/2018    Procedure: CARDIOVERSION;  Surgeon: Nahser, Wonda Cheng, MD;  Location: [facility] ENDOSCOPY;  Service: Cardiovascular;  Laterality: N/A;   CARDIOVERSION N/A 12/10/2019    Procedure: CARDIOVERSION;  Surgeon: Buford Dresser, MD;  Location: [facility] ENDOSCOPY;  Service: Cardiovascular;  Laterality: N/A;   CARDIOVERSION N/A 03/09/2021    Procedure: CARDIOVERSION;  Surgeon: Pixie Casino, MD;  Location: [place];  Service: Cardiovascular;  Laterality: N/A;   CHOLECYSTECTOMY       COLONOSCOPY W/ POLYPECTOMY        Dr Earlean Shawl   CYSTOSCOPY N/A 02/06/2015    Procedure: CYSTOSCOPY;  Surgeon: Kathie Rhodes, MD;  Location: [facility];  Service: Urology;  Laterality: N/A;   CYSTOSCOPY W/ RETROGRADES Left 11/17/2017    Procedure: CYSTOSCOPY WITH RETROGRADE /PYELOGRAM/;  Surgeon: Kathie Rhodes, MD;  Location: [facility];  Service: Urology;  Laterality: Left;   CYSTOSCOPY WITH RETROGRADE PYELOGRAM, URETEROSCOPY AND STENT  PLACEMENT  Right 02/06/2015    Procedure: RETROGRADE PYELOGRAM, RIGHT URETEROSCOPY STENT PLACEMENT;  Surgeon: Kathie Rhodes, MD;  Location: [facility];  Service: Urology;  Laterality: Right;   CYSTOSCOPY WITH RETROGRADE PYELOGRAM, URETEROSCOPY AND STENT PLACEMENT Right 03/07/2017    Procedure: CYSTOSCOPY WITH RIGHT RETROGRADE PYELOGRAM,RIGHT  URETEROSCOPYLASER LITHOTRIPSY  AND STENT PLACEMENT AND STONE BASKETRY;  Surgeon: Kathie Rhodes, MD;  Location: [place];  Service: Urology;  Laterality: Right;   EYE SURGERY        cataract surgery   fatty mass removal   1999    pubic area   HOLMIUM LASER APPLICATION N/A 4/81/8563    Procedure: HOLMIUM LASER APPLICATION;  Surgeon: Kathie Rhodes, MD;  Location: [facility];  Service: Urology;  Laterality: N/A;   HOLMIUM LASER APPLICATION Right 1/49/7026    Procedure: HOLMIUM LASER APPLICATION;  Surgeon: Kathie Rhodes, MD;  Location: [facility];  Service: Urology;  Laterality: Right;   HOLMIUM LASER APPLICATION Left 3/78/5885    Procedure: HOLMIUM LASER APPLICATION;  Surgeon: Kathie Rhodes, MD;  [facility];  Service: Urology;  Laterality: Left;   I & D EXTREMITY Left 12/19/2020    Procedure: IRRIGATION AND DEBRIDEMENT OF LEFT HIP HEMATOMA WITH APPLICATION OF WOUND VAC;  Surgeon: Erle Crocker, MD;  Location: [place];  Service: Orthopedics;  Laterality: Left;   I & D EXTREMITY Left 02/06/2021    Procedure: IRRIGATION AND DEBRIDEMENT DEEP ABCESS LEFT THIGH, SECONDARY CLOSURE OF WOUND DEHISCENCE;  Surgeon: Erle Crocker, MD;  Location: [place];  Service: Orthopedics;  Laterality: Left;   IR FLUORO GUIDE PORT INSERTION RIGHT   02/24/2017   IR NEPHROSTOMY PLACEMENT RIGHT   11/17/2017   IR PATIENT EVAL TECH 0-60 MINS   03/11/2017   IR REMOVAL TUN ACCESS W/ PORT W/O FL MOD SED   04/20/2018   IR US GUIDE VASC ACCESS RIGHT   02/24/2017   KNEE ARTHROSCOPY        bilateral   LAPAROSCOPIC GASTRIC BANDING   07/10/2010   LAPAROSCOPIC GASTRIC BANDING         Laparoscopic adjustable banding APS System with posterior hiatal hernia, 2 suture.   LAPAROTOMY        for ruptured ovary and ovarian artery    NEPHROLITHOTOMY Right 11/17/2017    Procedure: NEPHROLITHOTOMY PERCUTANEOUS;  Surgeon: Kathie Rhodes, MD;  [facility];  Service: Urology;  Laterality: Right;   PACEMAKER INSERTION   03/10/2013    MDT dual chamber PPM   POCKET REVISION N/A 12/08/2013    Procedure: POCKET REVISION;  Surgeon: Deboraha Sprang, MD;  Location: [facility] CATH LAB;  Service: Cardiovascular;  Laterality: N/A;   PORTA CATH INSERTION       REVERSE SHOULDER ARTHROPLASTY Right 05/14/2018    Procedure: RIGHT REVERSE SHOULDER ARTHROPLASTY;  Surgeon: Tania Ade, MD;  Location: [place];  Service: Orthopedics;  Laterality: Right;   REVERSE SHOULDER REPLACEMENT Right 05/14/2018   RIGHT HEART CATH N/A 07/21/2019    Procedure: RIGHT HEART CATH;  Surgeon: Larey Dresser, MD;  Location: [place] CV LAB;  Service: Cardiovascular;  Laterality: N/A;   TEE WITH CARDIOVERSION   09/22/2017   TEE WITHOUT CARDIOVERSION N/A 10/03/2017    Procedure: TRANSESOPHAGEAL ECHOCARDIOGRAM (TEE);  Surgeon: Sanda Klein, MD;  Location: [facility] ENDOSCOPY;  Service: Cardiovascular;  Laterality: N/A;   TEE WITHOUT CARDIOVERSION N/A 08/04/2019    Procedure: TRANSESOPHAGEAL ECHOCARDIOGRAM (TEE);  Surgeon: Larey Dresser, MD;  Location: [facility] ENDOSCOPY;  Service:  Cardiovascular;  Laterality: N/A;   TEE WITHOUT CARDIOVERSION N/A 12/10/2019    Procedure: TRANSESOPHAGEAL ECHOCARDIOGRAM (TEE);  Surgeon: Buford Dresser, MD;  Location: [facility] ENDOSCOPY;  Service: Cardiovascular;  Laterality: N/A;   TEE WITHOUT CARDIOVERSION N/A 03/09/2021    Procedure: TRANSESOPHAGEAL ECHOCARDIOGRAM (TEE);  Surgeon: Pixie Casino, MD;  Location: [place];  Service: Cardiovascular;  Laterality: N/A;   THYROIDECTOMY   1998    Dr Margot Chimes   TONSILLECTOMY       TOTAL KNEE ARTHROPLASTY   04/13/2012    Procedure: TOTAL KNEE  ARTHROPLASTY;  Surgeon: Rudean Haskell, MD;  Location: [place];  Service: Orthopedics;  Laterality: Right;   VIDEO BRONCHOSCOPY WITH ENDOBRONCHIAL ULTRASOUND N/A 02/07/2017    Procedure: VIDEO BRONCHOSCOPY WITH ENDOBRONCHIAL ULTRASOUND;  Surgeon: Marshell Garfinkel, MD;  Location: [place];  Service: Pulmonary;  Laterality: N/A;            Current Outpatient Medications  Medication Sig Dispense Refill   acetaminophen (TYLENOL) 325 MG tablet Take 2 tablets (650 mg total) by mouth every 4 (four) hours as needed for mild pain (or temp > 37.5 C (99.5 F)).       amiodarone (PACERONE) 200 MG tablet Take 1 tablet (200 mg total) by mouth in the morning and at bedtime.       aspirin EC 81 MG EC tablet Take 1 tablet (81 mg total) by mouth daily. Swallow whole. 30 tablet 11   levothyroxine (SYNTHROID) 137 MCG tablet Take 68.5-137 mcg by mouth See admin instructions. Taking 137 mcg tablet daily except on Saturday taking 1/2 tablet       levothyroxine (SYNTHROID) 150 MCG tablet Take 150 mcg by mouth every morning.       midodrine (PROAMATINE) 5 MG tablet Take 1 tablet (5 mg total) by mouth 2 (two) times daily with breakfast and lunch.       pramoxine (SARNA SENSITIVE) 1 % LOTN Apply 1 application topically 2 (two) times daily. 118 mL 0   rosuvastatin (CRESTOR) 10 MG tablet Take 1 tablet (10 mg total) by mouth daily. Mondays & Thursdays       senna-docusate (SENOKOT-S) 8.6-50 MG tablet Take 2 tablets by mouth 2 (two) times daily.        No current facility-administered medications for this visit.           Allergies  Allergen Reactions   Ranolazine        Balance issues   Rivaroxaban        Nose Bleed X 6 hrs ; packing in ER Nasal hemorrhage   Tape Rash and Other (See Comments)      SKIN IS THIN AND TEARS EASILY (also does not tolerate paper tape well)     Tikosyn [Dofetilide] Other (See Comments)      "Long QT wave"     Epinephrine Other (See Comments)      Oral anesthetic Dental form only (liquid).  Patient stated she will become "out of it" she can hear you but cannot respond in normal fashion. "not fully with it"   Ketamine Other (See Comments)      Hallucinations        Review of Systems negative except from HPI and PMH   Physical Exam BP 114/80 (BP Location: Left Arm, Patient Position: Sitting, Cuff Size: Normal)    Pulse (!) 103    Ht $R'5\' 6"'yR$  (1.676 m)    Wt 186 lb (84.4 kg)    BMI 30.02 kg/m  Well developed and Morbidly obese in no acute distress HENT normal Neck supple  Clear Device pocket well healed; without hematoma or erythema.  There is no tethering   irregularly irregular and rhythm, no gallop No murmur Abd-soft with active BS No Clubbing cyanosis tr edema Skin-warm and dry A & Oriented  Grossly normal sensory and motor function   ECG atrial fibrillation at 102 --/11/39       Assessment and  Plan   Atrial fibrillation/flutter  S/p convergent ablation persistent   Prior Strokes   Pacemaker Medtronic MRI compatible    Non-Hodgkin's lymphoma on chemotherapy   Dyspnea on exertion      Treated hypothyroidism   High Risk Medication Surveillance-amiodarone   Junctional rhythm   Orthostatic hypotension    Intracranial hemorrhage   Fatigue     The patient remains in atrial fibrillation and flutter and is not on anticoagulation following her ICH 6/22.  She saw Dr. Leonie Man recently who recommended randomization in the aspire trial, aspirin versus apixaban in patients with ICH.  Given the fact that the patient has persistent atrial fibrillation with rapid rates I would be inclined actually to put her back on apixaban based on age and the randomization protocol but also be data from [place] we are in [place] course of greater than 6-7 justified given the use of warfarin and certainly NOAC has a lower bleeding risk than warfarin.   A recent [place] study found no difference in outcome for nonfatal stroke or vascular death in patients randomly assigned  to apixaban or no therapy (half of whom got aspirin).     Other studies have suggested that there is a time dependent risk but    "risk declines rapidly over time " Vasc Med . 2020 Feb;25(1):55-59. doi: 10.1177/1358863 [id number]. Epub 2020 Jan 13.   Moreover, her fatigue may be aggravated by her atrial fibrillation.  It is also something seen post stroke.  I have reached out and discussed with Dr Leonie Man, who is agreeable to [place] using Apixoban ( occurred after patient left)  will review with pt    For now we will continue the amiodarone 200 twice daily as augmented rate control but will have to make a decision regarding going forward and if we cannot resume the NOAC, even if for short time to get back into sinus rhythm, we will need to consider AV junction ablation.   Orthostasis is relatively quiescient.  For right now we will continue the ProAmatine 5 twice daily                       Electronically signed by Deboraha Sprang, MD at 07/25/2021 11:22 AM  Recent telephone note reviewed.  Orders were placed by Dr. Jolyn Nap for TEE cardioversion.  Echocardiogram 03/16/2021 shows normal EF.  Severe TR.  EKG on 07/24/2021 showed atrial fibrillation heart rate 102.  Pacemaker in place.  Candee Furbish, MD

## 2021-10-04 NOTE — CV Procedure (Signed)
° °  Transesophageal Echocardiogram  Indications: Atrial fibrillation/flutter  Time out performed  During this procedure the patient was administered propofol under anesthesiology supervision to achieve and maintain moderate sedation.  The patient's heart rate, blood pressure, and oxygen saturation are monitored continuously during the procedure.   Findings:  Left Ventricle: Normal ejection fraction 60%  Mitral Valve: Mild to moderate mitral valve regurgitation.  Severe mitral annular calcification, Pisa radius 0.5 cm  Aortic Valve: Normal, aortic root atherosclerosis noted  Tricuspid Valve: Moderate tricuspid regurgitation  Left Atrium: Normal, no left atrial appendage thrombus  Right Atrium: Normal  Intraatrial septum: Normal  Bubble Contrast Study: Not performed  Atrial and ventricular pacemaker wires intact.  No vegetations.  Candee Furbish, MD     Electrical Cardioversion Procedure Note Kayla Harrison [id number] 06/11/1946  Procedure: Electrical Cardioversion Indications:  Atrial Fibrillation  Time Out: Verified patient identification, verified procedure,medications/allergies/relevent history reviewed, required imaging and test results available.  Performed  Procedure Details  The patient was NPO after midnight. Anesthesia was administered at the beside  by Dr. Gifford Shave with propofol.  Cardioversion was performed with synchronized biphasic defibrillation via AP pads with 120 joules.  1 attempt(s) were performed.  The patient converted to normal sinus rhythm. The patient tolerated the procedure well   IMPRESSION:  Successful cardioversion of atrial fibrillation, atrial pacing noted post.  Heart rate decreased to 70 from 100.  Medtronic representative will review pacer.    Candee Furbish 10/04/2021, 12:13 PM

## 2021-10-04 NOTE — Anesthesia Preprocedure Evaluation (Signed)
Anesthesia Evaluation  Patient identified by MRN, date of birth, ID band Patient awake    Reviewed: Allergy & Precautions, NPO status , Patient's Chart, lab work & pertinent test results, reviewed documented beta blocker date and time   History of Anesthesia Complications (+) history of anesthetic complications  Airway Mallampati: III  TM Distance: >3 FB Neck ROM: Full    Dental no notable dental hx. (+) Teeth Intact, Caps, Dental Advisory Given   Pulmonary shortness of breath and with exertion, sleep apnea and Continuous Positive Airway Pressure Ventilation ,  Lung Ca LL   Pulmonary exam normal breath sounds clear to auscultation       Cardiovascular hypertension, Pt. on medications + dysrhythmias Atrial Fibrillation + pacemaker  Rhythm:Irregular Rate:Abnormal  EKG 02/14/21 Atrial fibrillation  Echo 12/10/19 1. TEE without evidence of thrombus. Proceeded to successful cardioversion.  2. Left ventricular ejection fraction, by estimation, is 55 to 60%. The left ventricle has normal function. The left ventricle has no regional wall motion abnormalities. Left ventricular diastolic function could not be evaluated.  3. Right ventricular systolic function is normal. The right ventricular size is normal. There is moderately elevated pulmonary artery systolic pressure.  4. No left atrial/left atrial appendage thrombus was detected.  5. The mitral valve is degenerative. Mild mitral valve regurgitation. No evidence of mitral stenosis.  6. Tricuspid valve regurgitation is mild to moderate.  7. The aortic valve is tricuspid. Aortic valve regurgitation is trivial. No aortic stenosis is present.  8. There is mild (Grade II) plaque involving the descending aorta   Cardiac Cath 07/21/19 1. Elevated PCWP with prominent V waves. 2. Moderate pulmonary venous hypertension. 3. Normal right-sided filling pressure.  This looks like either  significant LV diastolic dysfunction or mitral valve disease. I looked at her TTE, neither mitral regurgitation nor mitral stenosis looks markedly significant.  However, I think that a TEE for closer look at mitral valve would be in order.  On Amiodarone- last dose this am  S/P multiple cardioversions and ablations   Neuro/Psych  Neuromuscular disease CVA, No Residual Symptoms negative psych ROS   GI/Hepatic hiatal hernia, (+) Hepatitis -Hx/o brucellosis as a teen Dysphagia S/P lap gastric band   Endo/Other  Hypothyroidism Obesity  Renal/GU Renal diseaseHx/o renal calculi  negative genitourinary   Musculoskeletal  (+) Arthritis , Osteoarthritis,    Abdominal   Peds  Hematology  (+) Blood dyscrasia, anemia , Hx/o NHL S/P RT Eliquis therapy-last dose this am    Anesthesia Other Findings   Reproductive/Obstetrics                             Anesthesia Physical  Anesthesia Plan  ASA: 3  Anesthesia Plan: General   Post-op Pain Management:    Induction: Intravenous  PONV Risk Score and Plan: 3 and Propofol infusion and Treatment may vary due to age or medical condition  Airway Management Planned: Mask and Natural Airway  Additional Equipment:   Intra-op Plan:   Post-operative Plan:   Informed Consent: I have reviewed the patients History and Physical, chart, labs and discussed the procedure including the risks, benefits and alternatives for the proposed anesthesia with the patient or authorized representative who has indicated his/her understanding and acceptance.     Dental advisory given  Plan Discussed with: CRNA and Anesthesiologist  Anesthesia Plan Comments:         Anesthesia Quick Evaluation

## 2021-10-04 NOTE — Transfer of Care (Signed)
Immediate Anesthesia Transfer of Care Note  Patient: Kayla Harrison  Procedure(s) Performed: TRANSESOPHAGEAL ECHOCARDIOGRAM (TEE) CARDIOVERSION  Patient Location: PACU  Anesthesia Type:MAC  Level of Consciousness: patient cooperative and responds to stimulation  Airway & Oxygen Therapy: Patient Spontanous Breathing and Patient connected to nasal cannula oxygen  Post-op Assessment: Report given to RN and Post -op Vital signs reviewed and stable  Post vital signs: Reviewed and stable  Last Vitals:  Vitals Value Taken Time  BP 104/62 10/04/21 1216  Temp    Pulse 71 10/04/21 1217  Resp 30 10/04/21 1217  SpO2 96 % 10/04/21 1217  Vitals shown include unvalidated device data.  Last Pain:  Vitals:   10/04/21 1006  TempSrc: Oral  PainSc: 0-No pain         Complications: No notable events documented.

## 2021-10-05 ENCOUNTER — Ambulatory Visit: Payer: Medicare Other | Admitting: Physical Therapy

## 2021-10-05 ENCOUNTER — Encounter (HOSPITAL_COMMUNITY): Payer: Self-pay | Admitting: Cardiology

## 2021-10-05 ENCOUNTER — Encounter: Payer: Medicare Other | Admitting: Occupational Therapy

## 2021-10-05 NOTE — Anesthesia Postprocedure Evaluation (Signed)
Anesthesia Post Note  Patient: Kayla Harrison  Procedure(s) Performed: TRANSESOPHAGEAL ECHOCARDIOGRAM (TEE) CARDIOVERSION     Patient location during evaluation: Endoscopy Anesthesia Type: General Level of consciousness: awake and alert Pain management: pain level controlled Vital Signs Assessment: post-procedure vital signs reviewed and stable Respiratory status: spontaneous breathing, nonlabored ventilation, respiratory function stable and patient connected to nasal cannula oxygen Cardiovascular status: blood pressure returned to baseline and stable Postop Assessment: no apparent nausea or vomiting Anesthetic complications: no   No notable events documented.  Last Vitals:  Vitals:   10/04/21 1230 10/04/21 1245  BP: 116/68 118/77  Pulse: 70 70  Resp: (!) 21 (!) 22  Temp:  (!) 36.3 C  SpO2: 99% 97%    Last Pain:  Vitals:   10/04/21 1245  TempSrc:   PainSc: 0-No pain   Pain Goal:                   Catalina Gravel

## 2021-10-08 ENCOUNTER — Ambulatory Visit: Payer: Medicare Other | Admitting: Physical Therapy

## 2021-10-08 ENCOUNTER — Other Ambulatory Visit: Payer: Self-pay

## 2021-10-08 ENCOUNTER — Ambulatory Visit: Payer: Medicare Other

## 2021-10-08 DIAGNOSIS — R2681 Unsteadiness on feet: Secondary | ICD-10-CM

## 2021-10-08 DIAGNOSIS — R41841 Cognitive communication deficit: Secondary | ICD-10-CM

## 2021-10-08 DIAGNOSIS — R41840 Attention and concentration deficit: Secondary | ICD-10-CM | POA: Diagnosis not present

## 2021-10-08 DIAGNOSIS — R1310 Dysphagia, unspecified: Secondary | ICD-10-CM | POA: Diagnosis not present

## 2021-10-08 DIAGNOSIS — R2689 Other abnormalities of gait and mobility: Secondary | ICD-10-CM

## 2021-10-08 DIAGNOSIS — M6281 Muscle weakness (generalized): Secondary | ICD-10-CM

## 2021-10-08 NOTE — Therapy (Signed)
[facility] [place] [address], [place], [place], [address] Phone: [phone]   Fax:  [phone]  Physical Therapy Treatment  Patient Details  Name: Kayla Harrison MRN: [id number] Date of Birth: Jan 20, 1946 Referring Provider (PT): Garvin Fila, MD   Encounter Date: 10/08/2021   PT End of Session - 10/08/21 0936     Visit Number 14    Number of Visits 24    Date for PT Re-Evaluation 11/09/21    Authorization Type Medicare & MoO    PT Start Time 0936    PT Stop Time 1694    PT Time Calculation (min) 39 min    Equipment Utilized During Treatment Gait belt    Activity Tolerance Patient tolerated treatment well;Patient limited by fatigue    Behavior During Therapy WFL for tasks assessed/performed             Past Medical History:  Diagnosis Date   Anemia    Arthritis    osteoarthritis - knees and right shoulder   Blood transfusion without reported diagnosis    Breast cancer ([place])    Dr Margot Chimes, total thyroidectomy- 1999- for cancer   Brucellosis 1964   Chronic bilateral pleural effusions    Colon polyp    Dr Earlean Shawl   Complication of anesthesia    Ketamine produces LSD reaction, bright colored nightmarish experience    Dyslipidemia    Endometriosis    Fibroids    H/O pleural effusion    s/p thoracentesis w 3284m withdrawn   Hepatitis    [place] ?hepatitis    History of dysphagia    due to radiation therapy   History of hiatal hernia    small noted on PET scan   Hypertension    Hypothyroidism    [place]   radiation RX completed 03/04/17; will start chemo 6/27, pt unaware of lung cancer   Morbid obesity ([place]    Status post lap band surgery   Nephrolithiasis    Non Hodgkin's lymphoma ([place]    on chemotherapy   Persistent atrial fibrillation ([place]    a. s/p PVI 2008 b. s/p convergent ablation 25038complicated by bradycardia requiring pacemaker  implant   Personal history of radiation therapy    Presence of permanent cardiac pacemaker    Rotator cuff tear    Right   [place]   SVC syndrome    with lung mass and non hodgkins lymphoma   Thyroid cancer ([place] 2000    Past Surgical History:  Procedure Laterality Date   ABDOMINAL HYSTERECTOMY  1983   afib ablation     a. 2008 PVI b. 2014 convergent ablation   APPENDECTOMY     BONE MARROW BIOPSY  02/21/2017   BREAST LUMPECTOMY Left 2010   [place]    2015- negative   CARDIOVERSION  10/09/2012   Procedure: CARDIOVERSION;  Surgeon: JMinus Breeding MD;  [facility]  Service: Cardiovascular;  Laterality: N/A;   CARDIOVERSION  10/09/2012   Procedure: CARDIOVERSION;  Surgeon: JMinus Breeding MD;  [facility];  Service: Cardiovascular;  Laterality: N/A;  [name] the ok to add pt to the add on , but we must check to find out if the can add pt on at 1400 ([phone]   CARDIOVERSION N/A 11/20/2012   Procedure: CARDIOVERSION;  Surgeon: PNevin Bloodgood  Alcus Dad, MD;  Location: [place];  Service: Cardiovascular;  Laterality: N/A;   CARDIOVERSION N/A 07/18/2017   Procedure: CARDIOVERSION;  Surgeon: Thayer Headings, MD;  Location: [place];  Service: Cardiovascular;  Laterality: N/A;   CARDIOVERSION N/A 10/03/2017   Procedure: CARDIOVERSION;  Surgeon: Sanda Klein, MD;  Location: [place];  Service: Cardiovascular;  Laterality: N/A;   CARDIOVERSION N/A 01/07/2018   Procedure: CARDIOVERSION;  Surgeon: Acie Fredrickson Wonda Cheng, MD;  Location: [place];  Service: Cardiovascular;  Laterality: N/A;   CARDIOVERSION N/A 12/10/2019   Procedure: CARDIOVERSION;  Surgeon: Buford Dresser, MD;  Location: [facility] ENDOSCOPY;  Service: Cardiovascular;  Laterality: N/A;   CARDIOVERSION N/A 03/09/2021   Procedure: CARDIOVERSION;  Surgeon: Pixie Casino, MD;  Location: [facility] ENDOSCOPY;  Service: Cardiovascular;  Laterality: N/A;   CARDIOVERSION  N/A 10/04/2021   Procedure: CARDIOVERSION;  Surgeon: Jerline Pain, MD;  Location: [place];  Service: Cardiovascular;  Laterality: N/A;   CHOLECYSTECTOMY     COLONOSCOPY W/ POLYPECTOMY     Dr Earlean Shawl   CYSTOSCOPY N/A 02/06/2015   Procedure: CYSTOSCOPY;  Surgeon: Kathie Rhodes, MD;  [facility];  Service: Urology;  Laterality: N/A;   CYSTOSCOPY W/ RETROGRADES Left 11/17/2017   Procedure: CYSTOSCOPY WITH RETROGRADE /PYELOGRAM/;  Surgeon: Kathie Rhodes, MD;  [facility];  Service: Urology;  Laterality: Left;   CYSTOSCOPY WITH RETROGRADE PYELOGRAM, URETEROSCOPY AND STENT PLACEMENT Right 02/06/2015   Procedure: RETROGRADE PYELOGRAM, RIGHT URETEROSCOPY STENT PLACEMENT;  Surgeon: Kathie Rhodes, MD;  [facility];  Service: Urology;  Laterality: Right;   CYSTOSCOPY WITH RETROGRADE PYELOGRAM, URETEROSCOPY AND STENT PLACEMENT Right 03/07/2017   Procedure: CYSTOSCOPY WITH RIGHT RETROGRADE PYELOGRAM,RIGHT  URETEROSCOPYLASER LITHOTRIPSY  AND STENT PLACEMENT AND STONE BASKETRY;  Surgeon: Kathie Rhodes, MD;  Location: [place];  Service: Urology;  Laterality: Right;   EYE SURGERY     cataract surgery   fatty mass removal  1999   pubic area   HOLMIUM LASER APPLICATION N/A 5/78/4696   Procedure: HOLMIUM LASER APPLICATION;  Surgeon: Kathie Rhodes, MD;  [facility];  Service: Urology;  Laterality: N/A;   HOLMIUM LASER APPLICATION Right 2/95/2841   Procedure: HOLMIUM LASER APPLICATION;  Surgeon: Kathie Rhodes, MD;  Location: [facility];  Service: Urology;  Laterality: Right;   HOLMIUM LASER APPLICATION Left 12/14/4008   Procedure: HOLMIUM LASER APPLICATION;  Surgeon: Kathie Rhodes, MD;  [facility];  Service: Urology;  Laterality: Left;   I & D EXTREMITY Left 12/19/2020   Procedure: IRRIGATION AND DEBRIDEMENT OF LEFT HIP HEMATOMA WITH APPLICATION OF WOUND VAC;  Surgeon: Erle Crocker, MD;  Location: [place];  Service: Orthopedics;  Laterality: Left;   I  & D EXTREMITY Left 02/06/2021   Procedure: IRRIGATION AND DEBRIDEMENT DEEP ABCESS LEFT THIGH, SECONDARY CLOSURE OF WOUND DEHISCENCE;  Surgeon: Erle Crocker, MD;  Location: [place];  Service: Orthopedics;  Laterality: Left;   IR FLUORO GUIDE PORT INSERTION RIGHT  02/24/2017   IR NEPHROSTOMY PLACEMENT RIGHT  11/17/2017   IR PATIENT EVAL TECH 0-60 MINS  03/11/2017   IR REMOVAL TUN ACCESS W/ PORT W/O FL MOD SED  04/20/2018   IR US GUIDE VASC ACCESS RIGHT  02/24/2017   KNEE ARTHROSCOPY     bilateral   LAPAROSCOPIC GASTRIC BANDING  07/10/2010   LAPAROSCOPIC GASTRIC BANDING     Laparoscopic adjustable banding APS System with posterior hiatal hernia, 2 suture.   LAPAROTOMY     for ruptured ovary and ovarian  artery    NEPHROLITHOTOMY Right 11/17/2017   Procedure: NEPHROLITHOTOMY PERCUTANEOUS;  Surgeon: Kathie Rhodes, MD;  [facility];  Service: Urology;  Laterality: Right;   PACEMAKER INSERTION  03/10/2013   MDT dual chamber PPM   POCKET REVISION N/A 12/08/2013   Procedure: POCKET REVISION;  Surgeon: Deboraha Sprang, MD;  Location: [facility] CATH LAB;  Service: Cardiovascular;  Laterality: N/A;   PORTA CATH INSERTION     REVERSE SHOULDER ARTHROPLASTY Right 05/14/2018   Procedure: RIGHT REVERSE SHOULDER ARTHROPLASTY;  Surgeon: Tania Ade, MD;  Location: [place];  Service: Orthopedics;  Laterality: Right;   REVERSE SHOULDER REPLACEMENT Right 05/14/2018   RIGHT HEART CATH N/A 07/21/2019   Procedure: RIGHT HEART CATH;  Surgeon: Larey Dresser, MD;  Location: [place] [place] CV LAB;  Service: Cardiovascular;  Laterality: N/A;   TEE WITH CARDIOVERSION  09/22/2017   TEE WITHOUT CARDIOVERSION N/A 10/03/2017   Procedure: TRANSESOPHAGEAL ECHOCARDIOGRAM (TEE);  Surgeon: Sanda Klein, MD;  Location: [facility] ENDOSCOPY;  Service: Cardiovascular;  Laterality: N/A;   TEE WITHOUT CARDIOVERSION N/A 08/04/2019   Procedure: TRANSESOPHAGEAL ECHOCARDIOGRAM (TEE);  Surgeon: Larey Dresser, MD;  Location: [facility] ENDOSCOPY;   Service: Cardiovascular;  Laterality: N/A;   TEE WITHOUT CARDIOVERSION N/A 12/10/2019   Procedure: TRANSESOPHAGEAL ECHOCARDIOGRAM (TEE);  Surgeon: Buford Dresser, MD;  Location: [facility] ENDOSCOPY;  Service: Cardiovascular;  Laterality: N/A;   TEE WITHOUT CARDIOVERSION N/A 03/09/2021   Procedure: TRANSESOPHAGEAL ECHOCARDIOGRAM (TEE);  Surgeon: Pixie Casino, MD;  Location: [facility] ENDOSCOPY;  Service: Cardiovascular;  Laterality: N/A;   TEE WITHOUT CARDIOVERSION N/A 10/04/2021   Procedure: TRANSESOPHAGEAL ECHOCARDIOGRAM (TEE);  Surgeon: Jerline Pain, MD;  Location[facility]COPY;  Service: Cardiovascular;  Laterality: N/A;   THYROIDECTOMY  1998   Dr Margot Chimes   TONSILLECTOMY     TOTAL KNEE ARTHROPLASTY  04/13/2012   Procedure: TOTAL KNEE ARTHROPLASTY;  Surgeon: Rudean Haskell, MD;  Location: [place];  Service: Orthopedics;  Laterality: Right;   VIDEO BRONCHOSCOPY WITH ENDOBRONCHIAL ULTRASOUND N/A 02/07/2017   Procedure: VIDEO BRONCHOSCOPY WITH ENDOBRONCHIAL ULTRASOUND;  Surgeon: Marshell Garfinkel, MD;  Location: [place];  Service: Pulmonary;  Laterality: N/A;    There were no vitals filed for this visit.   Subjective Assessment - 10/08/21 0938     Subjective No changes since last visit.  Using the rollator today, "the girls insist that I use it".  No restrictions or precautions from the TEE procedure, that I'm aware of.    Pertinent History anemia, breast CA s/p surgery, lung CA s/p radiation and chemo, lymphoma on chemo, thyroid CA, HLD, hiatal hernia, HTN, a-fib, pacemaker, R reverse TSA 2019, stroke 2003, SVC syndrome, R TKA 2013    Limitations Lifting;Standing;Walking;House hold activities    How long can you stand comfortably? 2.5 hours out of the house    How long can you walk comfortably? 2.5 hours out of the house    Diagnostic tests 03/15/21 head CT: Similar appearance of left caudothalamic hemorrhage with  intraventricular extension and mild adjacent edema. No new  hemorrhage. Unchanged mild  trapping of the left lateral ventricle    Patient Stated Goals be able to get up off the floor    Currently in Pain? No/denies    Pain Onset In the past 7 days                    Access Code: [email or web address] URL: [email or web address] Date: 10/08/2021 Prepared by: [facility]  Exercises-Reviewed exercises  below in bold, with pt return demo understanding.  Min cues for foot clearance and stretch technique. Sit to Stand - 1 x daily - 5 x weekly - 2 sets - 5 reps Marching with Resistance - 1 x daily - 5 x weekly - 2 sets - 10 reps Side to Side Weight Shift with Counter Support - 1 x daily - 5 x weekly - 2 sets - 10 reps - 3 sec hold Gastroc Stretch on Wall - 1 x daily - 5 x weekly - 2 sets - 30 sec hold Standing Toe Taps - 1 x daily - 5 x weekly - 2 sets - 10 reps           [facility] Adult PT Treatment/Exercise - 10/08/21 0001       Knee/Hip Exercises: Aerobic   Nustep Level 4 all 4 extremities x 6 minutes with steps per minute >75                 Balance Exercises - 10/08/21 0001       Balance Exercises: Standing   Standing, One Foot on a Step Eyes open;Head turns;Limitations    Standing, One Foot on a Step Limitations Head turns x 5, head nods x 5, alt UE lifts x 5    Sidestepping Upper extremity support;4 reps;Limitations    Sidestepping Limitations R and L along parallel bars    Step Over Hurdles / Cones over low orange hurdle, 3 reps each leg, with increased fatigue.  Needs BUE support.  Switched to black half-bolster, forward step taps over, 10 reps, BUE support    Other Standing Exercises Side step together at parallel bars R and L, 10 reps x 2 sets                  PT Short Term Goals - 09/28/21 1050       PT SHORT TERM GOAL #1   Title Patient to be independent with initial HEP.    Time 3    Period Weeks    Status Achieved    Target Date 08/08/21               PT Long Term Goals -  09/28/21 1051       PT LONG TERM GOAL #1   Title Patient to be independent with advanced HEP.    Time 6    Period Weeks    Status On-going   limited compliance d/t medical concerns/issues   Target Date 11/09/21      PT LONG TERM GOAL #2   Title Patient to demonstrate B LE strength >/=4+/5.    Time 6    Period Weeks    Status Partially Met   weakness still remaining   Target Date 11/09/21      PT LONG TERM GOAL #3   Title Patient to score at least 46/56 on Berg in order to decrease risk of falls.    Time 6    Period Weeks    Status On-going   34/56   Target Date 11/09/21      PT LONG TERM GOAL #4   Title Patient to complete TUG in <14 sec with LRAD in order to decrease risk of falls.    Time 6    Period Weeks    Status On-going   18.7 sec with [facility]   Target Date 11/09/21      PT LONG TERM GOAL #5   Title Patient to demonstrate 5xSTS test  in <15 sec in order to decrease risk of falls.    Time 6    Period Weeks    Status On-going   requires increased time d/t attempting without UEs; 50 sec   Target Date 11/09/21      PT LONG TERM GOAL #6   Title Patient to demonstrate safe floor transfer with mod I.    Time 6    Period Weeks    Status Deferred   NT d/t safety   Target Date 11/09/21                   Plan - 10/08/21 1009     Clinical Impression Statement No new complaints at session today; she does present with rollator today, upon previous recommendation of therapists.  Pt reports doing some of her exercises at home already today (but she has not yet done alt step taps at home).  Reviewed the step taps today, with pt return demo understanding and worked additional exercises on hip stability and single limb stance.  Pt uses UE support, and requires several seated rest breaks, but she does tolerate the balance exercises well.    Personal Factors and Comorbidities Age;Fitness;Comorbidity 3+;Transportation;Time since onset of injury/illness/exacerbation;Past/Current  Experience    Comorbidities anemia, breast CA s/p surgery, lung CA s/p radiation and chemo, lymphoma on chemo, thyroid CA, HLD, hiatal hernia, HTN, a-fib, pacemaker, R reverse TSA 2019, stroke 2003, SVC syndrome, R TKA 2013    Examination-Activity Limitations Bathing;Locomotion Level;Transfers;Bed Mobility;Reach Overhead;Bend;Carry;Squat;Dressing;Stairs;Stand;Hygiene/Grooming;Lift;Toileting    Examination-Participation Restrictions Laundry;Shop;Community Activity;Cleaning;Church;Meal Prep    Stability/Clinical Decision Making Stable/Uncomplicated    Rehab Potential Good    PT Frequency Other (comment)   1-2x   PT Duration 6 weeks    PT Treatment/Interventions ADLs/Self Care Home Management;Cryotherapy;Electrical Stimulation;DME Instruction;Moist Heat;Gait training;Stair training;Functional mobility training;Therapeutic activities;Therapeutic exercise;Balance training;Neuromuscular re-education;Manual techniques;Patient/family education;Passive range of motion;Dry needling;Energy conservation;Vestibular;Vasopneumatic Device;Taping    PT Next Visit Plan Continue balance training with SLS activities; progress BLE stretching and strengthening to improve posture and gait deviations; continue gait training with SPC as appropriate.  Encourage gait with RW when not in therapy sessions.    Consulted and Agree with Plan of Care Patient             Patient will benefit from skilled therapeutic intervention in order to improve the following deficits and impairments:  Abnormal gait, Decreased range of motion, Difficulty walking, Increased fascial restricitons, Increased muscle spasms, Decreased safety awareness, Decreased endurance, Cardiopulmonary status limiting activity, Decreased activity tolerance, Decreased balance, Impaired flexibility, Improper body mechanics, Postural dysfunction, Decreased strength  Visit Diagnosis: Unsteadiness on feet  Muscle weakness (generalized)  Other abnormalities of  gait and mobility     Problem List Patient Active Problem List   Diagnosis Date Noted   Hypertension    Pressure injury of skin 03/15/2021   Intracranial hemorrhage (HCC) 03/14/2021   Lower extremity edema    Polymicrobial bacterial infection    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    Enterococcus faecalis infection    Sepsis due to Escherichia coli ([place])    Debility 02/13/2021   Infection of wound hematoma 02/05/2021   Physical debility 12/22/2020   Tear of left hamstring 12/22/2020   Labral tear of left hip joint 12/22/2020   Acute blood loss anemia    Orthostatic hypotension 12/17/2020   History of CVA (cerebrovascular accident) 12/17/2020   Fall 12/15/2020   Hip hematoma, left, initial encounter 12/15/2020   Nondisplaced fracture of coronoid process of left  ulna, initial encounter for closed fracture 12/15/2020   Multiple rib fractures 12/15/2020   COVID-19 virus infection 10/12/2020   Constipation 06/22/2020   Change in bowel habits 06/22/2020   Diverticulosis 06/22/2020   Dark stools 06/22/2020   Hemorrhoids 06/22/2020   Pacemaker 01/23/2020   Junctional rhythm 01/23/2020   Palpitations 01/23/2020   ICH (intracerebral hemorrhage) ([place]) 12/02/2019   A-fib ([place]) 12/02/2019   Anemia 12/02/2019   QT prolongation 12/02/2019   Hypothyroidism 12/02/2019   Gait abnormality 07/10/2018   S/P reverse total shoulder arthroplasty, 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   Persistent atrial fibrillation ([place])    Bilateral ureteral calculi 11/17/2017   Atrial fibrillation with RVR ([place]) 09/15/2017   Atrial flutter ([place]) 07/17/2017   Port catheter in place 04/02/2017   Non-Hodgkin lymphoma, unspecified, intrathoracic lymph nodes ([place]) 02/13/2017   Mediastinal mass 02/06/2017   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/2017   Abnormal chest x-ray    Lung mass 02/03/2017   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   OSA (obstructive sleep apnea) 02/03/2015   History of renal calculi 11/16/2014    Renal calculi 11/16/2014   Family history of colon cancer 10/26/2014   Mechanical complication due to cardiac pacemaker pulse generator 11/06/2013   Dyspnea 05/12/2013   Hypothyroidism 04/21/2013   Pleural effusion 04/19/2013   Long term (current) use of anticoagulants 12/20/2010   EPIDERMOID CYST 08/22/2010   Chronic diastolic heart failure ([place]) 08/06/2010   SYNCOPE AND COLLAPSE 07/27/2010   OSTEOARTHRITIS, KNEE, RIGHT 03/15/2010   Class 1 obesity due to excess calories with body mass index (BMI) of 33.0 to 33.9 in adult 12/07/2009   NONSPEC ELEVATION OF LEVELS OF TRANSAMINASE/LDH 09/08/2009   BREAST CANCER, HX OF 07/25/2009   COLONIC POLYPS, HX OF 07/25/2009   TUBULOVILLOUS ADENOMA, COLON 04/29/2008   HYPERGLYCEMIA, FASTING 04/29/2008   HYPERLIPIDEMIA 06/22/2007   CARCINOMA, THYROID GLAND, HX OF 06/22/2007    Danh Bayus W., PT 10/08/2021, 10:17 AM  [facility] [address], [place], [place], [address] Phone: [phone]   Fax:  [phone]  Name: Kayla Harrison MRN: [id number] Date of Birth: March 03, 1946

## 2021-10-09 NOTE — Patient Instructions (Signed)
°  Since you said  you are no longer coughing with meals, we agreed to discontinue your swallow exercises.

## 2021-10-09 NOTE — Therapy (Signed)
[facility] [place], [place], [place], [address] Phone: [phone]   Fax:  [phone]  Speech Language Pathology Treatment  Patient Details  Name: Kayla Harrison MRN: [id number] Date of Birth: 1945-11-12 Referring Provider (SLP): Royal Hawthorn, MD   Encounter Date: 10/08/2021   End of Session - 10/09/21 0031     Visit Number 3    Number of Visits 13    Date for SLP Re-Evaluation 11/23/21    SLP Start Time 1018    SLP Stop Time  1100    SLP Time Calculation (min) 42 min    Activity Tolerance Patient tolerated treatment well             Past Medical History:  Diagnosis Date   Anemia    Arthritis    osteoarthritis - knees and right shoulder   Blood transfusion without reported diagnosis    Breast cancer ([place])    Dr Margot Chimes, total thyroidectomy- 1999- for cancer   Brucellosis 1964   Chronic bilateral pleural effusions    Colon polyp    Dr Earlean Shawl   Complication of anesthesia    Ketamine produces LSD reaction, bright colored nightmarish experience    Dyslipidemia    Endometriosis    Fibroids    H/O pleural effusion    s/p thoracentesis w 3269m withdrawn   Hepatitis    [place] ?hepatitis    History of dysphagia    due to radiation therapy   History of hiatal hernia    small noted on PET scan   Hypertension    Hypothyroidism    Lung cancer, lower lobe ([place] 01/2017   radiation RX completed 03/04/17; will start chemo 6/27, pt unaware of lung cancer   Morbid obesity ([facility]    Status post lap band surgery   Nephrolithiasis    Non Hodgkin's lymphoma ([place]    on chemotherapy   Persistent atrial fibrillation ([place]    a. s/p PVI 2008 b. s/p convergent ablation 22876complicated by bradycardia requiring pacemaker implant   Personal history of radiation therapy    Presence of permanent cardiac pacemaker    Rotator cuff tear    Right   [place]   SVC  syndrome    with lung mass and non hodgkins lymphoma   Thyroid cancer ([facility] 2000    Past Surgical History:  Procedure Laterality Date   ABDOMINAL HYSTERECTOMY  1983   afib ablation     a. 2008 PVI b. 2014 convergent ablation   APPENDECTOMY     BONE MARROW BIOPSY  02/21/2017   BREAST LUMPECTOMY Left 2010   [place]    2015- negative   CARDIOVERSION  10/09/2012   Procedure: CARDIOVERSION;  Surgeon: JMinus Breeding MD;  [place] [facility]  Service: Cardiovascular;  Laterality: N/A;   CARDIOVERSION  10/09/2012   Procedure: CARDIOVERSION;  Surgeon: JMinus Breeding MD;  [facility]  Service: Cardiovascular;  Laterality: N/A;  [name] the ok to add pt to the add on , but we must check to find out if the can add pt on at 1400 [phone]   CARDIOVERSION N/A 11/20/2012   Procedure: CARDIOVERSION;  Surgeon: PFay Records MD;  [facility]  Service: Cardiovascular;  Laterality: N/A;   CARDIOVERSION N/A 07/18/2017   Procedure: CARDIOVERSION;  Surgeon: NThayer Headings MD;  Location:  [place] [facility]OSCOPY;  Service: Cardiovascular;  Laterality: N/A;   CARDIOVERSION N/A 10/03/2017   Procedure: CARDIOVERSION;  Surgeon: Sanda Klein, MD;  Location: [place];  Service: Cardiovascular;  Laterality: N/A;   CARDIOVERSION N/A 01/07/2018   Procedure: CARDIOVERSION;  Surgeon: Thayer Headings, MD;  Location: [place];  Service: Cardiovascular;  Laterality: N/A;   CARDIOVERSION N/A 12/10/2019   Procedure: CARDIOVERSION;  Surgeon: Buford Dresser, MD;  [facility];  Service: Cardiovascular;  Laterality: N/A;   CARDIOVERSION N/A 03/09/2021   Procedure: CARDIOVERSION;  Surgeon: Pixie Casino, MD;  Location[facility] ENDOSCOPY;  Service: Cardiovascular;  Laterality: N/A;   CARDIOVERSION N/A 10/04/2021   Procedure: CARDIOVERSION;  Surgeon: Jerline Pain, MD;  Location: [place];  Service: Cardiovascular;  Laterality: N/A;   CHOLECYSTECTOMY      COLONOSCOPY W/ POLYPECTOMY     Dr Earlean Shawl   CYSTOSCOPY N/A 02/06/2015   Procedure: CYSTOSCOPY;  Surgeon: Kathie Rhodes, MD;  [facility];  Service: Urology;  Laterality: N/A;   CYSTOSCOPY W/ RETROGRADES Left 11/17/2017   Procedure: CYSTOSCOPY WITH RETROGRADE /PYELOGRAM/;  Surgeon: Kathie Rhodes, MD;  [facility];  Service: Urology;  Laterality: Left;   CYSTOSCOPY WITH RETROGRADE PYELOGRAM, URETEROSCOPY AND STENT PLACEMENT Right 02/06/2015   Procedure: RETROGRADE PYELOGRAM, RIGHT URETEROSCOPY STENT PLACEMENT;  Surgeon: Kathie Rhodes, MD;  [facility];  Service: Urology;  Laterality: Right;   CYSTOSCOPY WITH RETROGRADE PYELOGRAM, URETEROSCOPY AND STENT PLACEMENT Right 03/07/2017   Procedure: CYSTOSCOPY WITH RIGHT RETROGRADE PYELOGRAM,RIGHT  URETEROSCOPYLASER LITHOTRIPSY  AND STENT PLACEMENT AND STONE BASKETRY;  Surgeon: Kathie Rhodes, MD;  Location: [place];  Service: Urology;  Laterality: Right;   EYE SURGERY     cataract surgery   fatty mass removal  1999   pubic area   HOLMIUM LASER APPLICATION N/A 02/27/3015   Procedure: HOLMIUM LASER APPLICATION;  Surgeon: Kathie Rhodes, MD;  [facility];  Service: Urology;  Laterality: N/A;   HOLMIUM LASER APPLICATION Right 0/06/9322   Procedure: HOLMIUM LASER APPLICATION;  Surgeon: Kathie Rhodes, MD;  [facility];  Service: Urology;  Laterality: Right;   HOLMIUM LASER APPLICATION Left 5/57/3220   Procedure: HOLMIUM LASER APPLICATION;  Surgeon: Kathie Rhodes, MD;  [facility];  Service: Urology;  Laterality: Left;   I & D EXTREMITY Left 12/19/2020   Procedure: IRRIGATION AND DEBRIDEMENT OF LEFT HIP HEMATOMA WITH APPLICATION OF WOUND VAC;  Surgeon: Erle Crocker, MD;  Location: [place];  Service: Orthopedics;  Laterality: Left;   I & D EXTREMITY Left 02/06/2021   Procedure: IRRIGATION AND DEBRIDEMENT DEEP ABCESS LEFT THIGH, SECONDARY CLOSURE OF WOUND DEHISCENCE;  Surgeon: Erle Crocker,  MD;  Location: [employer];  Service: Orthopedics;  Laterality: Left;   IR FLUORO GUIDE PORT INSERTION RIGHT  02/24/2017   IR NEPHROSTOMY PLACEMENT RIGHT  11/17/2017   IR PATIENT EVAL TECH 0-60 MINS  03/11/2017   IR REMOVAL TUN ACCESS W/ PORT W/O FL MOD SED  04/20/2018   IR US GUIDE VASC ACCESS RIGHT  02/24/2017   KNEE ARTHROSCOPY     bilateral   LAPAROSCOPIC GASTRIC BANDING  07/10/2010   LAPAROSCOPIC GASTRIC BANDING     Laparoscopic adjustable banding APS System with posterior hiatal hernia, 2 suture.   LAPAROTOMY     for ruptured ovary and ovarian artery    NEPHROLITHOTOMY Right 11/17/2017   Procedure: NEPHROLITHOTOMY PERCUTANEOUS;  Surgeon: Kathie Rhodes, MD;  [facility];  Service: Urology;  Laterality: Right;   PACEMAKER  INSERTION  03/10/2013   MDT dual chamber PPM   POCKET REVISION N/A 12/08/2013   Procedure: POCKET REVISION;  Surgeon: Deboraha Sprang, MD;  Location: [facility] CATH LAB;  Service: Cardiovascular;  Laterality: N/A;   PORTA CATH INSERTION     REVERSE SHOULDER ARTHROPLASTY Right 05/14/2018   Procedure: RIGHT REVERSE SHOULDER ARTHROPLASTY;  Surgeon: Tania Ade, MD;  Location: [place];  Service: Orthopedics;  Laterality: Right;   REVERSE SHOULDER REPLACEMENT Right 05/14/2018   RIGHT HEART CATH N/A 07/21/2019   Procedure: RIGHT HEART CATH;  Surgeon: Larey Dresser, MD;  Location: [place] CV LAB;  Service: Cardiovascular;  Laterality: N/A;   TEE WITH CARDIOVERSION  09/22/2017   TEE WITHOUT CARDIOVERSION N/A 10/03/2017   Procedure: TRANSESOPHAGEAL ECHOCARDIOGRAM (TEE);  Surgeon: Sanda Klein, MD;  Location: [facility] ENDOSCOPY;  Service: Cardiovascular;  Laterality: N/A;   TEE WITHOUT CARDIOVERSION N/A 08/04/2019   Procedure: TRANSESOPHAGEAL ECHOCARDIOGRAM (TEE);  Surgeon: Larey Dresser, MD;  Location: [facility] ENDOSCOPY;  Service: Cardiovascular;  Laterality: N/A;   TEE WITHOUT CARDIOVERSION N/A 12/10/2019   Procedure: TRANSESOPHAGEAL ECHOCARDIOGRAM (TEE);  Surgeon: Buford Dresser, MD;  Location: [facility] ENDOSCOPY;  Service: Cardiovascular;  Laterality: N/A;   TEE WITHOUT CARDIOVERSION N/A 03/09/2021   Procedure: TRANSESOPHAGEAL ECHOCARDIOGRAM (TEE);  Surgeon: Pixie Casino, MD;  Location: [facility] ENDOSCOPY;  Service: Cardiovascular;  Laterality: N/A;   TEE WITHOUT CARDIOVERSION N/A 10/04/2021   Procedure: TRANSESOPHAGEAL ECHOCARDIOGRAM (TEE);  Surgeon: Jerline Pain, MD;  Location: [facility] ENDOSCOPY;  Service: Cardiovascular;  Laterality: N/A;   THYROIDECTOMY  1998   Dr Margot Chimes   TONSILLECTOMY     TOTAL KNEE ARTHROPLASTY  04/13/2012   Procedure: TOTAL KNEE ARTHROPLASTY;  Surgeon: Rudean Haskell, MD;  Location: [place];  Service: Orthopedics;  Laterality: Right;   VIDEO BRONCHOSCOPY WITH ENDOBRONCHIAL ULTRASOUND N/A 02/07/2017   Procedure: VIDEO BRONCHOSCOPY WITH ENDOBRONCHIAL ULTRASOUND;  Surgeon: Marshell Garfinkel, MD;  Location: [place];  Service: Pulmonary;  Laterality: N/A;    There were no vitals filed for this visit.   Subjective Assessment - 10/08/21 1025     Subjective "I'm not coughing anymore. It seems to have passed." Pt reports "I am feeling better since the procedure."    Currently in Pain? No/denies                   ADULT SLP TREATMENT - 10/09/21 0001       Treatment Provided   Treatment provided Dysphagia      Dysphagia Treatment   Type of PO's observed Thin liquids    Other treatment/comments Today after reviewing and completing swallowing exercises, Mackenzye said, "Would you tell me why I'm doing these? I'm not really having any more difficulty coughing." Pt reports that she is able to sing, eat a full meal, and is not coughing any more since she told OT about it on 09-28-21. She is comfortable discontinuing swallowing exercises. SLP provided pt with an organization task which took her min extra time but was accurate.SLP provided pt with checkwriting task since this is funcional for her.      Assessment / Recommendations / Plan   Plan Continue  with current plan of care   SLP to keep swallow goals on POC in case pt declines and begins coughing again     Progression Toward Goals   Progression toward goals Not progressing toward goals (comment)                SLP Short Term Goals -  10/09/21 0035       SLP SHORT TERM GOAL #1   Title pt will track medication administration with a medication tracking sheet 80% of the time    Time 4    Period Weeks    Status On-going    Target Date 10/26/21      SLP SHORT TERM GOAL #2   Title pt will verbalize 3 deficts/changes in thinking skills compared to 12 months ago in 2 sessions    Time 4    Period Weeks    Status On-going    Target Date 10/26/21      SLP SHORT TERM GOAL #3   Title pt will complete an 8 minute therapy task x3 in 2 sessions    Time 4    Period Weeks    Status On-going    Target Date 10/26/21      SLP SHORT TERM GOAL #4   Title pt will demonstrate emergent awareness when writing checks with occasional min A from SLP    Time 4    Period Weeks    Status On-going    Target Date 10/26/21      SLP SHORT TERM GOAL #5   Title pt will perform HEP with occasional min A (for dysphagia)    Time 4    Period Weeks    Status New    Target Date 10/26/21              SLP Long Term Goals - 10/09/21 0035       SLP LONG TERM GOAL #1   Title pt will demo emergent awareness with detailed therapy tasks by correcting her errors for 100% accuracy with modified independence (extra time, double-checking, etc) in 3 sessions    Time 6    Period Weeks    Status On-going    Target Date 11/09/21      SLP LONG TERM GOAL #2   Title pt will demo memory and emergent awarenss adequate to track medication administration for 10 consecutive days without an error    Time 6    Period Weeks    Status On-going      SLP LONG TERM GOAL #3   Title pt will demo sustained and selective attention sufficient to complete a 10 minute therapy task x2, in 2 sessions    Time 6    Period  Weeks    Status On-going      SLP LONG TERM GOAL #4   Title pt will demo anticipatory awareness by independently double-checking therapy tasks 100% of the time in 3 sessions    Time 6    Period Weeks    Status On-going    Target Date 11/09/21      SLP LONG TERM GOAL #5   Title pt will perform swallowing HEP with rare min A over 2 sessions    Time 6    Period Weeks    Status On-going    Target Date 11/09/21              Plan - 10/09/21 0031     Clinical Impression Statement Donnesha presents today with some cognitive communication deficits in the areas of awareness (intellectual, emergent, and anticapatory), memory, and likely some component of attention. See daily note for details. Pt had an ablation done last week and states this has helped her stamina. Goal/s added for dysphagia last session. x2/week was recommended however pt states she thinks she can only attend ST x1/week at  this time due to stamina, so she will be scheduled at x1/week with hopes during this 6 week plan of care that she can incr to x2/week.    Speech Therapy Frequency 2x / week    Duration --   6 weeks, however pt states once/week is preferred   Treatment/Interventions Environmental controls;Functional tasks;SLP instruction and feedback;Cueing hierarchy;Cognitive reorganization;Compensatory strategies;Internal/external aids;Patient/family education    Potential to Achieve Goals Fair    Potential Considerations Cooperation/participation level;Severity of impairments;Family/community support;Ability to learn/carryover information    Consulted and Agree with Plan of Care Patient             Patient will benefit from skilled therapeutic intervention in order to improve the following deficits and impairments:   Cognitive communication deficit  Dysphagia, unspecified type    Problem List Patient Active Problem List   Diagnosis Date Noted   Hypertension    Pressure injury of skin 03/15/2021    Intracranial hemorrhage ([place]) 03/14/2021   Lower extremity edema    Polymicrobial bacterial infection    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    Enterococcus faecalis infection    Sepsis due to Escherichia coli ([place])    Debility 02/13/2021   Infection of wound hematoma 02/05/2021   Physical debility 12/22/2020   Tear of left hamstring 12/22/2020   Labral tear of left hip joint 12/22/2020   Acute blood loss anemia    Orthostatic hypotension 12/17/2020   History of CVA (cerebrovascular accident) 12/17/2020   Fall 12/15/2020   Hip hematoma, left, initial encounter 12/15/2020   Nondisplaced fracture of coronoid process of left ulna, initial encounter for closed fracture 12/15/2020   Multiple rib fractures 12/15/2020   COVID-19 virus infection 10/12/2020   Constipation 06/22/2020   Change in bowel habits 06/22/2020   Diverticulosis 06/22/2020   Dark stools 06/22/2020   Hemorrhoids 06/22/2020   Pacemaker 01/23/2020   Junctional rhythm 01/23/2020   Palpitations 01/23/2020   ICH (intracerebral hemorrhage) ([place]) 12/02/2019   A-fib ([place]) 12/02/2019   Anemia 12/02/2019   QT prolongation 12/02/2019   Hypothyroidism 12/02/2019   Gait abnormality 07/10/2018   S/P reverse total shoulder arthroplasty, 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   Persistent atrial fibrillation ([place])    Bilateral ureteral calculi 11/17/2017   Atrial fibrillation with RVR ([place]) 09/15/2017   Atrial flutter ([place]) 07/17/2017   Port catheter in place 04/02/2017   Non-Hodgkin lymphoma, unspecified, intrathoracic lymph nodes ([place]) 02/13/2017   Mediastinal mass 02/06/2017   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[place]) 02/06/2017   Abnormal chest x-ray    Lung mass 02/03/2017   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   OSA (obstructive sleep apnea) 02/03/2015   History of renal calculi 11/16/2014   Renal calculi 11/16/2014   Family history of colon cancer 10/26/2014   Mechanical complication due to cardiac pacemaker pulse generator 11/06/2013    Dyspnea 05/12/2013   Hypothyroidism 04/21/2013   Pleural effusion 04/19/2013   Long term (current) use of anticoagulants 12/20/2010   EPIDERMOID CYST 08/22/2010   Chronic diastolic heart failure ([place]) 08/06/2010   SYNCOPE AND COLLAPSE 07/27/2010   OSTEOARTHRITIS, KNEE, RIGHT 03/15/2010   Class 1 obesity due to excess calories with body mass index (BMI) of 33.0 to 33.9 in adult 12/07/2009   NONSPEC ELEVATION OF LEVELS OF TRANSAMINASE/LDH 09/08/2009   BREAST CANCER, HX OF 07/25/2009   COLONIC POLYPS, HX OF 07/25/2009   TUBULOVILLOUS ADENOMA, COLON 04/29/2008   HYPERGLYCEMIA, FASTING 04/29/2008   HYPERLIPIDEMIA 06/22/2007   CARCINOMA, THYROID GLAND, HX OF 06/22/2007  [facility] [place] 10/09/2021, 12:37 AM  [facility] [place] [address] [place], [place], [address] Phone: [phone]   Fax:  [phone]   Name: Kayla Harrison MRN: [id number] Date of Birth: [birth year]

## 2021-10-10 ENCOUNTER — Ambulatory Visit: Payer: Medicare Other | Admitting: Physical Therapy

## 2021-10-10 ENCOUNTER — Telehealth: Payer: Self-pay | Admitting: *Deleted

## 2021-10-10 NOTE — Telephone Encounter (Signed)
° °  Telephone encounter was:  Unsuccessful.  10/10/2021 Name: Kayla Harrison MRN: [id number] DOB: 13-May-1946  Unsuccessful outbound call made today to assist with:  social  Outreach Attempt:  2nd Attempt  A HIPAA compliant voice message was left requesting a return call.  Instructed patient to call back at   Instructed patient to call back at [phone]  at their earliest convenience. .  [place], Care Management  [phone] [address] , [place] [address] Email : [name]

## 2021-10-11 ENCOUNTER — Telehealth: Payer: Self-pay | Admitting: *Deleted

## 2021-10-11 NOTE — Telephone Encounter (Signed)
° °  Telephone encounter was:  Unsuccessful.  10/11/2021 Name: Kayla Harrison MRN: [id number] DOB: 10-May-1946  Unsuccessful outbound call made today to assist with:  Socialization  Outreach Attempt:  2nd Attempt  Mailbox is full no messages can be left at this time   [place], Care Management  [phone] [address] , [place] [address] Email : [name]

## 2021-10-12 ENCOUNTER — Ambulatory Visit: Payer: Medicare Other | Admitting: Occupational Therapy

## 2021-10-12 ENCOUNTER — Ambulatory Visit: Payer: Medicare Other | Admitting: Physical Therapy

## 2021-10-12 ENCOUNTER — Encounter: Payer: Self-pay | Admitting: Occupational Therapy

## 2021-10-12 ENCOUNTER — Other Ambulatory Visit: Payer: Self-pay

## 2021-10-12 ENCOUNTER — Encounter: Payer: Self-pay | Admitting: Physical Therapy

## 2021-10-12 ENCOUNTER — Telehealth: Payer: Self-pay | Admitting: *Deleted

## 2021-10-12 DIAGNOSIS — M6281 Muscle weakness (generalized): Secondary | ICD-10-CM

## 2021-10-12 DIAGNOSIS — R2681 Unsteadiness on feet: Secondary | ICD-10-CM

## 2021-10-12 DIAGNOSIS — R41841 Cognitive communication deficit: Secondary | ICD-10-CM | POA: Diagnosis not present

## 2021-10-12 DIAGNOSIS — R41840 Attention and concentration deficit: Secondary | ICD-10-CM | POA: Diagnosis not present

## 2021-10-12 DIAGNOSIS — R1310 Dysphagia, unspecified: Secondary | ICD-10-CM | POA: Diagnosis not present

## 2021-10-12 DIAGNOSIS — R2689 Other abnormalities of gait and mobility: Secondary | ICD-10-CM

## 2021-10-12 NOTE — Therapy (Signed)
[facility] [place], [place], [place], [address] Phone: [phone]   Fax:  [phone]  Occupational Therapy Treatment  Patient Details  Name: Kayla Harrison MRN: [id number] Date of Birth: 01/02/1946 Referring Provider (OT): Leonie Man   Encounter Date: 10/12/2021   OT End of Session - 10/12/21 1025     Visit Number 15    Number of Visits 25    Date for OT Re-Evaluation 11/07/21    Authorization Type Medicare A and B    Authorization - Visit Number 5    Authorization - Number of Visits 10    Progress Note Due on Visit 10    OT Start Time 1020    OT Stop Time 1101    OT Time Calculation (min) 41 min    Activity Tolerance Patient limited by fatigue    Behavior During Therapy WFL for tasks assessed/performed             Past Medical History:  Diagnosis Date   Anemia    Arthritis    osteoarthritis - knees and right shoulder   Blood transfusion without reported diagnosis    Breast cancer ([place])    Dr Margot Chimes, total thyroidectomy- 1999- for cancer   Brucellosis 1964   Chronic bilateral pleural effusions    Colon polyp    Dr Earlean Shawl   Complication of anesthesia    Ketamine produces LSD reaction, bright colored nightmarish experience    Dyslipidemia    Endometriosis    Fibroids    H/O pleural effusion    s/p thoracentesis w 3275ml withdrawn   Hepatitis    [place], ?hepatitis    History of dysphagia    due to radiation therapy   History of hiatal hernia    small noted on PET scan   Hypertension    Hypothyroidism    Lung cancer, lower lobe ([place]) 01/2017   radiation RX completed 03/04/17; will start chemo 6/27, pt unaware of lung cancer   Morbid obesity ([place])    Status post lap band surgery   Nephrolithiasis    Non Hodgkin's lymphoma ([place])    on chemotherapy   Persistent atrial fibrillation ([place])    a. s/p PVI 2008 b. s/p convergent ablation 1438 complicated by bradycardia  requiring pacemaker implant   Personal history of radiation therapy    Presence of permanent cardiac pacemaker    Rotator cuff tear    Right   Stroke ([place])    2003- [place] x2   SVC syndrome    with lung mass and non hodgkins lymphoma   Thyroid cancer ([place]) 2000    Past Surgical History:  Procedure Laterality Date   ABDOMINAL HYSTERECTOMY  1983   afib ablation     a. 2008 PVI b. 2014 convergent ablation   APPENDECTOMY     BONE MARROW BIOPSY  02/21/2017   BREAST LUMPECTOMY Left 2010   [place]     2015- negative   CARDIOVERSION  10/09/2012   Procedure: CARDIOVERSION;  Surgeon: Minus Breeding, MD;  Location: [place];  Service: Cardiovascular;  Laterality: N/A;   CARDIOVERSION  10/09/2012   Procedure: CARDIOVERSION;  Surgeon: Minus Breeding, MD;  Location: [facility] ENDOSCOPY;  Service: Cardiovascular;  Laterality: N/A;  [name] gave the ok to add pt to the add on , but we must check to find out if the can add pt on at [phone] ( [phone])  CARDIOVERSION N/A 11/20/2012   Procedure: CARDIOVERSION;  Surgeon: Pricilla Riffle, MD;  Location: [facility] ENDOSCOPY;  Service: Cardiovascular;  Laterality: N/A;   CARDIOVERSION N/A 07/18/2017   Procedure: CARDIOVERSION;  Surgeon: Vesta Mixer, MD;  Location: [facility] ENDOSCOPY;  Service: Cardiovascular;  Laterality: N/A;   CARDIOVERSION N/A 10/03/2017   Procedure: CARDIOVERSION;  Surgeon: Thurmon Fair, MD;  Location: [facility] ENDOSCOPY;  Service: Cardiovascular;  Laterality: N/A;   CARDIOVERSION N/A 01/07/2018   Procedure: CARDIOVERSION;  Surgeon: Elease Hashimoto Deloris Ping, MD;  Location: [facility] ENDOSCOPY;  Service: Cardiovascular;  Laterality: N/A;   CARDIOVERSION N/A 12/10/2019   Procedure: CARDIOVERSION;  Surgeon: Jodelle Red, MD;  Location: [facility] ENDOSCOPY;  Service: Cardiovascular;  Laterality: N/A;   CARDIOVERSION N/A 03/09/2021   Procedure: CARDIOVERSION;  Surgeon: Chrystie Nose, MD;  Location: [facility] ENDOSCOPY;  Service: Cardiovascular;  Laterality:  N/A;   CARDIOVERSION N/A 10/04/2021   Procedure: CARDIOVERSION;  Surgeon: Jake Bathe, MD;  Location: [facility] ENDOSCOPY;  Service: Cardiovascular;  Laterality: N/A;   CHOLECYSTECTOMY     COLONOSCOPY W/ POLYPECTOMY     Dr Kinnie Scales   CYSTOSCOPY N/A 02/06/2015   Procedure: CYSTOSCOPY;  Surgeon: Ihor Gully, MD;  Location: [facility] ORS;  Service: Urology;  Laterality: N/A;   CYSTOSCOPY W/ RETROGRADES Left 11/17/2017   Procedure: CYSTOSCOPY WITH RETROGRADE /PYELOGRAM/;  Surgeon: Ihor Gully, MD;  Location: [facility] ORS;  Service: Urology;  Laterality: Left;   CYSTOSCOPY WITH RETROGRADE PYELOGRAM, URETEROSCOPY AND STENT PLACEMENT Right 02/06/2015   Procedure: RETROGRADE PYELOGRAM, RIGHT URETEROSCOPY STENT PLACEMENT;  Surgeon: Ihor Gully, MD;  Location: [facility];  Service: Urology;  Laterality: Right;   CYSTOSCOPY WITH RETROGRADE PYELOGRAM, URETEROSCOPY AND STENT PLACEMENT Right 03/07/2017   Procedure: CYSTOSCOPY WITH RIGHT RETROGRADE PYELOGRAM,RIGHT  URETEROSCOPYLASER LITHOTRIPSY  AND STENT PLACEMENT AND STONE BASKETRY;  Surgeon: Ihor Gully, MD;  Location: [facility];  Service: Urology;  Laterality: Right;   EYE SURGERY     cataract surgery   fatty mass removal  1999   pubic area   HOLMIUM LASER APPLICATION N/A 02/06/2015   Procedure: HOLMIUM LASER APPLICATION;  Surgeon: Ihor Gully, MD;  Location: [facility];  Service: Urology;  Laterality: N/A;   HOLMIUM LASER APPLICATION Right 03/07/2017   Procedure: HOLMIUM LASER APPLICATION;  Surgeon: Ihor Gully, MD;  Location: [facility];  Service: Urology;  Laterality: Right;   HOLMIUM LASER APPLICATION Left 11/17/2017   Procedure: HOLMIUM LASER APPLICATION;  Surgeon: Ihor Gully, MD;  Location: [facility];  Service: Urology;  Laterality: Left;   I & D EXTREMITY Left 12/19/2020   Procedure: IRRIGATION AND DEBRIDEMENT OF LEFT HIP HEMATOMA WITH APPLICATION OF WOUND VAC;  Surgeon: Terance Hart, MD;  Location: [facility] OR;  Service: Orthopedics;   Laterality: Left;   I & D EXTREMITY Left 02/06/2021   Procedure: IRRIGATION AND DEBRIDEMENT DEEP ABCESS LEFT THIGH, SECONDARY CLOSURE OF WOUND DEHISCENCE;  Surgeon: Terance Hart, MD;  Location: [facility] OR;  Service: Orthopedics;  Laterality: Left;   IR FLUORO GUIDE PORT INSERTION RIGHT  02/24/2017   IR NEPHROSTOMY PLACEMENT RIGHT  11/17/2017   IR PATIENT EVAL TECH 0-60 MINS  03/11/2017   IR REMOVAL TUN ACCESS W/ PORT W/O FL MOD SED  04/20/2018   IR US GUIDE VASC ACCESS RIGHT  02/24/2017   KNEE ARTHROSCOPY     bilateral   LAPAROSCOPIC GASTRIC BANDING  07/10/2010   LAPAROSCOPIC GASTRIC BANDING     Laparoscopic adjustable banding APS System with posterior hiatal hernia, 2 suture.  LAPAROTOMY     for ruptured ovary and ovarian artery    NEPHROLITHOTOMY Right 11/17/2017   Procedure: NEPHROLITHOTOMY PERCUTANEOUS;  Surgeon: Kathie Rhodes, MD;  Location: [facility]S;  Service: Urology;  Laterality: Right;   PACEMAKER INSERTION  03/10/2013   MDT dual chamber PPM   POCKET REVISION N/A 12/08/2013   Procedure: POCKET REVISION;  Surgeon: Deboraha Sprang, MD;  Location: [facility] CATH LAB;  Service: Cardiovascular;  Laterality: N/A;   PORTA CATH INSERTION     REVERSE SHOULDER ARTHROPLASTY Right 05/14/2018   Procedure: RIGHT REVERSE SHOULDER ARTHROPLASTY;  Surgeon: Tania Ade, MD;  Location: [place];  Service: Orthopedics;  Laterality: Right;   REVERSE SHOULDER REPLACEMENT Right 05/14/2018   RIGHT HEART CATH N/A 07/21/2019   Procedure: RIGHT HEART CATH;  Surgeon: Larey Dresser, MD;  Location: [place] CV LAB;  Service: Cardiovascular;  Laterality: N/A;   TEE WITH CARDIOVERSION  09/22/2017   TEE WITHOUT CARDIOVERSION N/A 10/03/2017   Procedure: TRANSESOPHAGEAL ECHOCARDIOGRAM (TEE);  Surgeon: Sanda Klein, MD;  Location: [facility] ENDOSCOPY;  Service: Cardiovascular;  Laterality: N/A;   TEE WITHOUT CARDIOVERSION N/A 08/04/2019   Procedure: TRANSESOPHAGEAL ECHOCARDIOGRAM (TEE);  Surgeon: Larey Dresser, MD;   Location: [facility] ENDOSCOPY;  Service: Cardiovascular;  Laterality: N/A;   TEE WITHOUT CARDIOVERSION N/A 12/10/2019   Procedure: TRANSESOPHAGEAL ECHOCARDIOGRAM (TEE);  Surgeon: Buford Dresser, MD;  Location: [facility] ENDOSCOPY;  Service: Cardiovascular;  Laterality: N/A;   TEE WITHOUT CARDIOVERSION N/A 03/09/2021   Procedure: TRANSESOPHAGEAL ECHOCARDIOGRAM (TEE);  Surgeon: Pixie Casino, MD;  Location: [facility] ENDOSCOPY;  Service: Cardiovascular;  Laterality: N/A;   TEE WITHOUT CARDIOVERSION N/A 10/04/2021   Procedure: TRANSESOPHAGEAL ECHOCARDIOGRAM (TEE);  Surgeon: Jerline Pain, MD;  Location: [facility] ENDOSCOPY;  Service: Cardiovascular;  Laterality: N/A;   THYROIDECTOMY  1998   Dr Margot Chimes   TONSILLECTOMY     TOTAL KNEE ARTHROPLASTY  04/13/2012   Procedure: TOTAL KNEE ARTHROPLASTY;  Surgeon: Rudean Haskell, MD;  Location: [place];  Service: Orthopedics;  Laterality: Right;   VIDEO BRONCHOSCOPY WITH ENDOBRONCHIAL ULTRASOUND N/A 02/07/2017   Procedure: VIDEO BRONCHOSCOPY WITH ENDOBRONCHIAL ULTRASOUND;  Surgeon: Marshell Garfinkel, MD;  Location: [place];  Service: Pulmonary;  Laterality: N/A;    There were no vitals filed for this visit.   Subjective Assessment - 10/12/21 1023     Subjective  Pt reports everything is "so much better" since having cardioversion.  Pt reports walking, balance, endurance is improved.    Pertinent History h/o hyperlipidemia, atrial fibrillation with pacemaker on Eliquis, lymphoma, confusion, subcortical hemorrhage on the left with intraventricular extension    Currently in Pain? No/denies    Pain Onset In the past 7 days             Treatment - 10/12/21 1023     ADL: Pt reports feeling so much better post cardioversion.  Pt reports having more endurance to complete household tasks, even cooking.  Pt demonstrating improved endurance and engagement throughout session.  Discussed [place] attempting to call pt.  Pt stated she typically does not answer unknown numbers,  but will check messages and return calls.  Pt states that she has a friend who will be coming over today to help her with her messages and ensuring that her voicemail box is empty.     ADL: Engaged in [place] box activity with pt able to recall 5 prescriptions and then fill pill box according to written directions.  Pt reports using a morning and an evening pill  box, however has had history of not taking medications.  Activity addressing memory and awareness of errors.  Pt with no made errors when sorting pills per written instructions.  Re-educated on use of pill box to increase success with taking medications.  Recommended use of alarm on phone as a reminder to avoid missing medications.                        OT Short Term Goals - 09/28/21 0950       OT SHORT TERM GOAL #1   Title Pt will verbalize understanding of 3 energy conservastion strategies to increase success with IADLs.    Time 3    Period Weeks    Status On-going    Target Date 10/17/21      OT SHORT TERM GOAL #2   Title Pt will be Independent in completing HEP for activity tolerance and generalized strengthening to increase independence with ADLs and IADLs.    Time 3    Period Weeks    Status On-going               OT Long Term Goals - 09/28/21 0950       OT LONG TERM GOAL #1   Title Pt will be independent in energy conservation strategies.    Time 6    Period Weeks    Status On-going    Target Date 11/07/21      OT LONG TERM GOAL #2   Title Pt will complete 10 min moderately challenging task with no rest break to demonstrate increased endurance as needed to engage in ADLs and IADLs.    Time 6    Period Weeks    Status On-going      OT LONG TERM GOAL #3   Title Pt will look in to independent/assisted living or alternative housing to assist with ADL and IADL needs.    Time 6    Period Weeks    Status On-going                   Plan - 10/12/21 1028     Clinical Impression  Statement Pt demonstrated significantly improved activity tolerance and endurance this session. Pt reports feeling much improved s/p cardioversion and feeling more engagement as well. Pt receptive to [facility] recommendation for community resources and planning, reports not receiving a phone call from [place] [facility] to her awareness.  Pt receptive to recommendations for assistance from friend to ensure VM in working order.  Pt demonstrating increased working memory and awareness this session durin g pill box task.    OT Occupational Profile and History Detailed Assessment- Review of Records and additional review of physical, cognitive, psychosocial history related to current functional performance    Occupational performance deficits (Please refer to evaluation for details): ADL's;IADL's;Leisure;Social Participation    Body Structure / Function / Physical Skills ADL;Balance;Body mechanics;Cardiopulmonary status limiting activity;Coordination;Endurance;FMC;GMC;IADL;Mobility;Pain;ROM;Proprioception;Sensation;Strength;UE functional use;Vision    Psychosocial Skills Environmental  Adaptations;Routines and Behaviors    Rehab Potential Good    Clinical Decision Making Limited treatment options, no task modification necessary    Comorbidities Affecting Occupational Performance: May have comorbidities impacting occupational performance    Modification or Assistance to Complete Evaluation  No modification of tasks or assist necessary to complete eval    OT Frequency 2x / week    OT Duration 6 weeks    OT Treatment/Interventions Self-care/ADL training;Cryotherapy;Electrical Stimulation;Ultrasound;Moist Heat;Fluidtherapy;Therapeutic exercise;Neuromuscular education;Energy conservation;Manual Therapy;Functional Mobility Training;DME and/or  AE instruction;Passive range of motion;Patient/family education;Visual/perceptual remediation/compensation;Cognitive remediation/compensation;Therapeutic activities;Balance  training;Psychosocial skills training;Coping strategies training    Plan Dynamic balance/mobility while addressing balance, dual tasking, working memory, safety awareness, and activity tolerance for safety during ADL/IADLs    OT Home Exercise Plan --    Consulted and Agree with Plan of Care Patient             Patient will benefit from skilled therapeutic intervention in order to improve the following deficits and impairments:   Body Structure / Function / Physical Skills: ADL, Balance, Body mechanics, Cardiopulmonary status limiting activity, Coordination, Endurance, FMC, GMC, IADL, Mobility, Pain, ROM, Proprioception, Sensation, Strength, UE functional use, Vision   Psychosocial Skills: Environmental  Adaptations, Routines and Behaviors   Visit Diagnosis: Unsteadiness on feet  Muscle weakness (generalized)  Other abnormalities of gait and mobility  Attention and concentration deficit    Problem List Patient Active Problem List   Diagnosis Date Noted   Hypertension    Pressure injury of skin 03/15/2021   Intracranial hemorrhage ([place]) 03/14/2021   Lower extremity edema    Polymicrobial bacterial infection    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    Enterococcus faecalis infection    Sepsis due to Escherichia coli ([place])    Debility 02/13/2021   Infection of wound hematoma 02/05/2021   Physical debility 12/22/2020   Tear of left hamstring 12/22/2020   Labral tear of left hip joint 12/22/2020   Acute blood loss anemia    Orthostatic hypotension 12/17/2020   History of CVA (cerebrovascular accident) 12/17/2020   Fall 12/15/2020   Hip hematoma, left, initial encounter 12/15/2020   Nondisplaced fracture of coronoid process of left ulna, initial encounter for closed fracture 12/15/2020   Multiple rib fractures 12/15/2020   COVID-19 virus infection 10/12/2020   Constipation 06/22/2020   Change in bowel habits 06/22/2020   Diverticulosis 06/22/2020   Dark stools  06/22/2020   Hemorrhoids 06/22/2020   Pacemaker 01/23/2020   Junctional rhythm 01/23/2020   Palpitations 01/23/2020   ICH (intracerebral hemorrhage) ([place]) 12/02/2019   A-fib ([place]) 12/02/2019   Anemia 12/02/2019   QT prolongation 12/02/2019   Hypothyroidism 12/02/2019   Gait abnormality 07/10/2018   S/P reverse total shoulder arthroplasty, 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   Persistent atrial fibrillation ([facility])    Bilateral ureteral calculi 11/17/2017   Atrial fibrillation with RVR ([place]) 09/15/2017   Atrial flutter ([place]) 07/17/2017   Port catheter in place 04/02/2017   Non-Hodgkin lymphoma, unspecified, intrathoracic lymph nodes ([place]) 02/13/2017   Mediastinal mass 02/06/2017   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   Abnormal chest x-ray    Lung mass 02/03/2017   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   OSA (obstructive sleep apnea) 02/03/2015   History of renal calculi 11/16/2014   Renal calculi 11/16/2014   Family history of colon cancer 10/26/2014   Mechanical complication due to cardiac pacemaker pulse generator 11/06/2013   Dyspnea 05/12/2013   Hypothyroidism 04/21/2013   Pleural effusion 04/19/2013   Long term (current) use of anticoagulants 12/20/2010   EPIDERMOID CYST 08/22/2010   Chronic diastolic heart failure ([place]) 08/06/2010   SYNCOPE AND COLLAPSE 07/27/2010   OSTEOARTHRITIS, KNEE, RIGHT 03/15/2010   Class 1 obesity due to excess calories with body mass index (BMI) of 33.0 to 33.9 in adult 12/07/2009   NONSPEC ELEVATION OF LEVELS OF TRANSAMINASE/LDH 09/08/2009   BREAST CANCER, HX OF 07/25/2009   COLONIC POLYPS, HX OF 07/25/2009   TUBULOVILLOUS ADENOMA, COLON 04/29/2008   HYPERGLYCEMIA, FASTING 04/29/2008  HYPERLIPIDEMIA 06/22/2007   CARCINOMA, THYROID GLAND, HX OF 06/22/2007    Simonne Come, OT 10/12/2021, 12:17 PM  [facility] [place] [address], [place], [place], [address] Phone: [phone]   Fax:   [phone]  Name: Kayla Harrison MRN: [id number] Date of Birth: 1945-11-20

## 2021-10-12 NOTE — Therapy (Signed)
[facility] [address], [place], [place], [address] Phone: [phone]   Fax:  [phone]  Physical Therapy Treatment  Patient Details  Name: Kayla Harrison MRN: [id number] Date of Birth: 1945-12-30 Referring Provider (Kayla Harrison): Garvin Fila, MD   Encounter Date: 10/12/2021   Kayla Harrison End of Session - 10/12/21 0939     Visit Number 15    Number of Visits 24    Date for Kayla Harrison Re-Evaluation 11/09/21    Authorization Type Medicare & MoO    Kayla Harrison Start Time 0935    Kayla Harrison Stop Time 1013    Kayla Harrison Time Calculation (min) 38 min    Equipment Utilized During Treatment Gait belt    Activity Tolerance Patient tolerated treatment well   brief seated rest breaks; HR check mid-session, 80 bpm   Behavior During Therapy WFL for tasks assessed/performed             Past Medical History:  Diagnosis Date   Anemia    Arthritis    osteoarthritis - knees and right shoulder   Blood transfusion without reported diagnosis    Breast cancer ([place])    Dr Margot Chimes, total thyroidectomy- 1999- for cancer   Brucellosis 1964   Chronic bilateral pleural effusions    Colon polyp    Dr Earlean Shawl   Complication of anesthesia    Ketamine produces LSD reaction, bright colored nightmarish experience    Dyslipidemia    Endometriosis    Fibroids    H/O pleural effusion    s/p thoracentesis w 3264ml withdrawn   Hepatitis    [place] ?hepatitis    History of dysphagia    due to radiation therapy   History of hiatal hernia    small noted on PET scan   Hypertension    Hypothyroidism    Lung cancer, lower lobe ([place]) 01/2017   radiation RX completed 03/04/17; will start chemo 6/27, Kayla Harrison unaware of lung cancer   Morbid obesity ([place])    Status post lap band surgery   Nephrolithiasis    Non Hodgkin's lymphoma ([place])    on chemotherapy   Persistent atrial fibrillation ([place])    a. s/p PVI 2008 b. s/p convergent ablation 5945 complicated by  bradycardia requiring pacemaker implant   Personal history of radiation therapy    Presence of permanent cardiac pacemaker    Rotator cuff tear    Right   Stroke ([place])    2003- [place] x2   SVC syndrome    with lung mass and non hodgkins lymphoma   Thyroid cancer ([place]) 2000    Past Surgical History:  Procedure Laterality Date   ABDOMINAL HYSTERECTOMY  1983   afib ablation     a. 2008 PVI b. 2014 convergent ablation   APPENDECTOMY     BONE MARROW BIOPSY  02/21/2017   BREAST LUMPECTOMY Left 2010   [place]     2015- negative   CARDIOVERSION  10/09/2012   Procedure: CARDIOVERSION;  Surgeon: Minus Breeding, MD;  Location: [place];  Service: Cardiovascular;  Laterality: N/A;   CARDIOVERSION  10/09/2012   Procedure: CARDIOVERSION;  Surgeon: Minus Breeding, MD;  Location: [facility] ENDOSCOPY;  Service: Cardiovascular;  Laterality: N/A;  [name] gave the ok to add Kayla Harrison to the add on , but we must check to find out if the can add Kayla Harrison on at [phone] ( [phone])   CARDIOVERSION N/A 11/20/2012  Procedure: CARDIOVERSION;  Surgeon: Fay Records, MD;  Location: [place];  Service: Cardiovascular;  Laterality: N/A;   CARDIOVERSION N/A 07/18/2017   Procedure: CARDIOVERSION;  Surgeon: Thayer Headings, MD;  Location: [place];  Service: Cardiovascular;  Laterality: N/A;   CARDIOVERSION N/A 10/03/2017   Procedure: CARDIOVERSION;  Surgeon: Sanda Klein, MD;  Location: [facility] ENDOSCOPY;  Service: Cardiovascular;  Laterality: N/A;   CARDIOVERSION N/A 01/07/2018   Procedure: CARDIOVERSION;  Surgeon: Acie Fredrickson Wonda Cheng, MD;  Location: [facility] ENDOSCOPY;  Service: Cardiovascular;  Laterality: N/A;   CARDIOVERSION N/A 12/10/2019   Procedure: CARDIOVERSION;  Surgeon: Buford Dresser, MD;  Location: [facility] ENDOSCOPY;  Service: Cardiovascular;  Laterality: N/A;   CARDIOVERSION N/A 03/09/2021   Procedure: CARDIOVERSION;  Surgeon: Pixie Casino, MD;  Location: [place];  Service: Cardiovascular;   Laterality: N/A;   CARDIOVERSION N/A 10/04/2021   Procedure: CARDIOVERSION;  Surgeon: Jerline Pain, MD;  Location: [place];  Service: Cardiovascular;  Laterality: N/A;   CHOLECYSTECTOMY     COLONOSCOPY W/ POLYPECTOMY     Dr Earlean Shawl   CYSTOSCOPY N/A 02/06/2015   Procedure: CYSTOSCOPY;  Surgeon: Kathie Rhodes, MD;  Location: [facility];  Service: Urology;  Laterality: N/A;   CYSTOSCOPY W/ RETROGRADES Left 11/17/2017   Procedure: CYSTOSCOPY WITH RETROGRADE /PYELOGRAM/;  Surgeon: Kathie Rhodes, MD;  Location: [facility];  Service: Urology;  Laterality: Left;   CYSTOSCOPY WITH RETROGRADE PYELOGRAM, URETEROSCOPY AND STENT PLACEMENT Right 02/06/2015   Procedure: RETROGRADE PYELOGRAM, RIGHT URETEROSCOPY STENT PLACEMENT;  Surgeon: Kathie Rhodes, MD;  Location: [facility];  Service: Urology;  Laterality: Right;   CYSTOSCOPY WITH RETROGRADE PYELOGRAM, URETEROSCOPY AND STENT PLACEMENT Right 03/07/2017   Procedure: CYSTOSCOPY WITH RIGHT RETROGRADE PYELOGRAM,RIGHT  URETEROSCOPYLASER LITHOTRIPSY  AND STENT PLACEMENT AND STONE BASKETRY;  Surgeon: Kathie Rhodes, MD;  Location: [place];  Service: Urology;  Laterality: Right;   EYE SURGERY     cataract surgery   fatty mass removal  1999   pubic area   HOLMIUM LASER APPLICATION N/A 0/93/8182   Procedure: HOLMIUM LASER APPLICATION;  Surgeon: Kathie Rhodes, MD;  Location: [facility];  Service: Urology;  Laterality: N/A;   HOLMIUM LASER APPLICATION Right 9/93/7169   Procedure: HOLMIUM LASER APPLICATION;  Surgeon: Kathie Rhodes, MD;  Location: [facility];  Service: Urology;  Laterality: Right;   HOLMIUM LASER APPLICATION Left 6/78/9381   Procedure: HOLMIUM LASER APPLICATION;  Surgeon: Kathie Rhodes, MD;  Location: [facility];  Service: Urology;  Laterality: Left;   I & D EXTREMITY Left 12/19/2020   Procedure: IRRIGATION AND DEBRIDEMENT OF LEFT HIP HEMATOMA WITH APPLICATION OF WOUND VAC;  Surgeon: Erle Crocker, MD;  Location: [facility];  Service:  Orthopedics;  Laterality: Left;   I & D EXTREMITY Left 02/06/2021   Procedure: IRRIGATION AND DEBRIDEMENT DEEP ABCESS LEFT THIGH, SECONDARY CLOSURE OF WOUND DEHISCENCE;  Surgeon: Erle Crocker, MD;  Location: [place];  Service: Orthopedics;  Laterality: Left;   IR FLUORO GUIDE PORT INSERTION RIGHT  02/24/2017   IR NEPHROSTOMY PLACEMENT RIGHT  11/17/2017   IR PATIENT EVAL TECH 0-60 MINS  03/11/2017   IR REMOVAL TUN ACCESS W/ PORT W/O FL MOD SED  04/20/2018   IR US GUIDE VASC ACCESS RIGHT  02/24/2017   KNEE ARTHROSCOPY     bilateral   LAPAROSCOPIC GASTRIC BANDING  07/10/2010   LAPAROSCOPIC GASTRIC BANDING     Laparoscopic adjustable banding APS System with posterior hiatal hernia, 2 suture.   LAPAROTOMY  for ruptured ovary and ovarian artery    NEPHROLITHOTOMY Right 11/17/2017   Procedure: NEPHROLITHOTOMY PERCUTANEOUS;  Surgeon: Kathie Rhodes, MD;  Location: [facility];  Service: Urology;  Laterality: Right;   PACEMAKER INSERTION  03/10/2013   MDT dual chamber PPM   POCKET REVISION N/A 12/08/2013   Procedure: POCKET REVISION;  Surgeon: Deboraha Sprang, MD;  Location: [facility] CATH LAB;  Service: Cardiovascular;  Laterality: N/A;   PORTA CATH INSERTION     REVERSE SHOULDER ARTHROPLASTY Right 05/14/2018   Procedure: RIGHT REVERSE SHOULDER ARTHROPLASTY;  Surgeon: Tania Ade, MD;  Location: [place];  Service: Orthopedics;  Laterality: Right;   REVERSE SHOULDER REPLACEMENT Right 05/14/2018   RIGHT HEART CATH N/A 07/21/2019   Procedure: RIGHT HEART CATH;  Surgeon: Larey Dresser, MD;  Location: [place] CV LAB;  Service: Cardiovascular;  Laterality: N/A;   TEE WITH CARDIOVERSION  09/22/2017   TEE WITHOUT CARDIOVERSION N/A 10/03/2017   Procedure: TRANSESOPHAGEAL ECHOCARDIOGRAM (TEE);  Surgeon: Sanda Klein, MD;  Location: [facility] ENDOSCOPY;  Service: Cardiovascular;  Laterality: N/A;   TEE WITHOUT CARDIOVERSION N/A 08/04/2019   Procedure: TRANSESOPHAGEAL ECHOCARDIOGRAM (TEE);  Surgeon: Larey Dresser, MD;  Location: [facility] ENDOSCOPY;  Service: Cardiovascular;  Laterality: N/A;   TEE WITHOUT CARDIOVERSION N/A 12/10/2019   Procedure: TRANSESOPHAGEAL ECHOCARDIOGRAM (TEE);  Surgeon: Buford Dresser, MD;  Location: [facility] ENDOSCOPY;  Service: Cardiovascular;  Laterality: N/A;   TEE WITHOUT CARDIOVERSION N/A 03/09/2021   Procedure: TRANSESOPHAGEAL ECHOCARDIOGRAM (TEE);  Surgeon: Pixie Casino, MD;  Location: [facility] ENDOSCOPY;  Service: Cardiovascular;  Laterality: N/A;   TEE WITHOUT CARDIOVERSION N/A 10/04/2021   Procedure: TRANSESOPHAGEAL ECHOCARDIOGRAM (TEE);  Surgeon: Jerline Pain, MD;  Location: [facility] ENDOSCOPY;  Service: Cardiovascular;  Laterality: N/A;   THYROIDECTOMY  1998   Dr Margot Chimes   TONSILLECTOMY     TOTAL KNEE ARTHROPLASTY  04/13/2012   Procedure: TOTAL KNEE ARTHROPLASTY;  Surgeon: Rudean Haskell, MD;  Location: [place];  Service: Orthopedics;  Laterality: Right;   VIDEO BRONCHOSCOPY WITH ENDOBRONCHIAL ULTRASOUND N/A 02/07/2017   Procedure: VIDEO BRONCHOSCOPY WITH ENDOBRONCHIAL ULTRASOUND;  Surgeon: Marshell Garfinkel, MD;  Location: [place];  Service: Pulmonary;  Laterality: N/A;    There were no vitals filed for this visit.   Subjective Assessment - 10/12/21 0938     Subjective Been feeling pretty well this week.  No changes.    Pertinent History anemia, breast CA s/p surgery, lung CA s/p radiation and chemo, lymphoma on chemo, thyroid CA, HLD, hiatal hernia, HTN, a-fib, pacemaker, R reverse TSA 2019, stroke 2003, SVC syndrome, R TKA 2013    Limitations Lifting;Standing;Walking;House hold activities    How long can you stand comfortably? 2.5 hours out of the house    How long can you walk comfortably? 2.5 hours out of the house    Diagnostic tests 03/15/21 head CT: Similar appearance of left caudothalamic hemorrhage with  intraventricular extension and mild adjacent edema. No new  hemorrhage. Unchanged mild trapping of the left lateral ventricle    Patient Stated Goals be able to get up  off the floor    Currently in Pain? No/denies    Pain Onset In the past 7 days                               [facility] Adult Kayla Harrison Treatment/Exercise - 10/12/21 0001       Transfers   Transfers Sit to Stand;Stand to Sit  Sit to Stand 5: Supervision;With upper extremity assist;From chair/3-in-1    Stand to Sit 5: Supervision;To chair/3-in-1;With upper extremity assist      Knee/Hip Exercises: Aerobic   Nustep Level 4 all 4 extremities x 6 minutes with steps per minute >75                 Balance Exercises - 10/12/21 0001       Balance Exercises: Standing   Standing, One Foot on a Step Eyes open;Head turns;Limitations    Standing, One Foot on a Step Limitations Head turns x 5, head nods x 5, alt UE lifts x 5, 2 sets    Retro Gait Upper extremity support;3 reps;Limitations    Retro Gait Limitations Forward/back in parallel bars    Step Over Hurdles / Cones Using black half-bolster, forward step taps over, 10 reps, BUE support, then side step taps over bolster x 10    Other Standing Exercises Side step together at parallel bars R and L, 10 reps x 2 sets                  Kayla Harrison Short Term Goals - 09/28/21 1050       Kayla Harrison SHORT TERM GOAL #1   Title Patient to be independent with initial HEP.    Time 3    Period Weeks    Status Achieved    Target Date 08/08/21               Kayla Harrison Long Term Goals - 09/28/21 1051       Kayla Harrison LONG TERM GOAL #1   Title Patient to be independent with advanced HEP.    Time 6    Period Weeks    Status On-going   limited compliance d/t medical concerns/issues   Target Date 11/09/21      Kayla Harrison LONG TERM GOAL #2   Title Patient to demonstrate B LE strength >/=4+/5.    Time 6    Period Weeks    Status Partially Met   weakness still remaining   Target Date 11/09/21      Kayla Harrison LONG TERM GOAL #3   Title Patient to score at least 46/56 on [place] in order to decrease risk of falls.    Time 6    Period Weeks    Status On-going    34/56   Target Date 11/09/21      Kayla Harrison LONG TERM GOAL #4   Title Patient to complete TUG in <14 sec with LRAD in order to decrease risk of falls.    Time 6    Period Weeks    Status On-going   18.7 sec with [facility]   Target Date 11/09/21      Kayla Harrison LONG TERM GOAL #5   Title Patient to demonstrate 5xSTS test in <15 sec in order to decrease risk of falls.    Time 6    Period Weeks    Status On-going   requires increased time d/t attempting without UEs; 50 sec   Target Date 11/09/21      Kayla Harrison LONG TERM GOAL #6   Title Patient to demonstrate safe floor transfer with mod I.    Time 6    Period Weeks    Status Deferred   NT d/t safety   Target Date 11/09/21                   Plan - 10/12/21 1002     Clinical Impression Statement  Continued skilled Kayla Harrison session focusing on lower extremity stregnth and single limb stance for improved balance.  She continues to need UE support for SLS activities, but overall she demonstrates improved overall endurance for activities throughout the session.    Personal Factors and Comorbidities Age;Fitness;Comorbidity 3+;Transportation;Time since onset of injury/illness/exacerbation;Past/Current Experience    Comorbidities anemia, breast CA s/p surgery, lung CA s/p radiation and chemo, lymphoma on chemo, thyroid CA, HLD, hiatal hernia, HTN, a-fib, pacemaker, R reverse TSA 2019, stroke 2003, SVC syndrome, R TKA 2013    Examination-Activity Limitations Bathing;Locomotion Level;Transfers;Bed Mobility;Reach Overhead;Bend;Carry;Squat;Dressing;Stairs;Stand;Hygiene/Grooming;Lift;Toileting    Examination-Participation Restrictions Laundry;Shop;Community Activity;Cleaning;Church;Meal Prep    Stability/Clinical Decision Making Stable/Uncomplicated    Rehab Potential Good    Kayla Harrison Frequency Other (comment)   1-2x   Kayla Harrison Duration 6 weeks    Kayla Harrison Treatment/Interventions ADLs/Self Care Home Management;Cryotherapy;Electrical Stimulation;DME Instruction;Moist Heat;Gait  training;Stair training;Functional mobility training;Therapeutic activities;Therapeutic exercise;Balance training;Neuromuscular re-education;Manual techniques;Patient/family education;Passive range of motion;Dry needling;Energy conservation;Vestibular;Vasopneumatic Device;Taping    Kayla Harrison Next Visit Plan Continue balance training with SLS activities; Continue to progress BLE stretching and functional strengthening to improve posture and gait deviations; continue gait training with SPC as appropriate.  Encourage gait with RW when not in therapy sessions.    Consulted and Agree with Plan of Care Patient             Patient will benefit from skilled therapeutic intervention in order to improve the following deficits and impairments:  Abnormal gait, Decreased range of motion, Difficulty walking, Increased fascial restricitons, Increased muscle spasms, Decreased safety awareness, Decreased endurance, Cardiopulmonary status limiting activity, Decreased activity tolerance, Decreased balance, Impaired flexibility, Improper body mechanics, Postural dysfunction, Decreased strength  Visit Diagnosis: Muscle weakness (generalized)  Unsteadiness on feet  Other abnormalities of gait and mobility     Problem List Patient Active Problem List   Diagnosis Date Noted   Hypertension    Pressure injury of skin 03/15/2021   Intracranial hemorrhage (HCC) 03/14/2021   Lower extremity edema    Polymicrobial bacterial infection    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    Enterococcus faecalis infection    Sepsis due to Escherichia coli ([place])    Debility 02/13/2021   Infection of wound hematoma 02/05/2021   Physical debility 12/22/2020   Tear of left hamstring 12/22/2020   Labral tear of left hip joint 12/22/2020   Acute blood loss anemia    Orthostatic hypotension 12/17/2020   History of CVA (cerebrovascular accident) 12/17/2020   Fall 12/15/2020   Hip hematoma, left, initial encounter 12/15/2020    Nondisplaced fracture of coronoid process of left ulna, initial encounter for closed fracture 12/15/2020   Multiple rib fractures 12/15/2020   COVID-19 virus infection 10/12/2020   Constipation 06/22/2020   Change in bowel habits 06/22/2020   Diverticulosis 06/22/2020   Dark stools 06/22/2020   Hemorrhoids 06/22/2020   Pacemaker 01/23/2020   Junctional rhythm 01/23/2020   Palpitations 01/23/2020   ICH (intracerebral hemorrhage) ([place]) 12/02/2019   A-fib ([place]) 12/02/2019   Anemia 12/02/2019   QT prolongation 12/02/2019   Hypothyroidism 12/02/2019   Gait abnormality 07/10/2018   S/P reverse total shoulder arthroplasty, 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   Persistent atrial fibrillation ([place])    Bilateral ureteral calculi 11/17/2017   Atrial fibrillation with RVR ([place]) 09/15/2017   Atrial flutter ([place]) 07/17/2017   Port catheter in place 04/02/2017   Non-Hodgkin lymphoma, unspecified, intrathoracic lymph nodes ([place]) 02/13/2017   Mediastinal mass 02/06/2017   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   Abnormal chest x-ray  Lung mass 02/03/2017   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   OSA (obstructive sleep apnea) 02/03/2015   History of renal calculi 11/16/2014   Renal calculi 11/16/2014   Family history of colon cancer 10/26/2014   Mechanical complication due to cardiac pacemaker pulse generator 11/06/2013   Dyspnea 05/12/2013   Hypothyroidism 04/21/2013   Pleural effusion 04/19/2013   Long term (current) use of anticoagulants 12/20/2010   EPIDERMOID CYST 08/22/2010   Chronic diastolic heart failure ([place]) 08/06/2010   SYNCOPE AND COLLAPSE 07/27/2010   OSTEOARTHRITIS, KNEE, RIGHT 03/15/2010   Class 1 obesity due to excess calories with body mass index (BMI) of 33.0 to 33.9 in adult 12/07/2009   NONSPEC ELEVATION OF LEVELS OF TRANSAMINASE/LDH 09/08/2009   BREAST CANCER, HX OF 07/25/2009   COLONIC POLYPS, HX OF 07/25/2009   TUBULOVILLOUS ADENOMA, COLON 04/29/2008   HYPERGLYCEMIA,  FASTING 04/29/2008   HYPERLIPIDEMIA 06/22/2007   CARCINOMA, THYROID GLAND, HX OF 06/22/2007    Kayla Bergstresser W., Kayla Harrison 10/12/2021, 10:17 AM  [facility] [address], [place], [place], [address] Phone: [phone]   Fax:  [phone]  Name: Kayla Harrison MRN: [id number] Date of Birth: 08-12-1946

## 2021-10-12 NOTE — Telephone Encounter (Signed)
° °  Telephone encounter was:  Unsuccessful.  10/12/2021 Name: Kayla Harrison MRN: [id number] DOB: 1946/02/14  Unsuccessful outbound call made today to assist with:    Outreach Attempt:  3rd  Voicemail full unable to leave message   [facility], Care Management  [phone] [address] , [place] [address] Email : [name]

## 2021-10-15 ENCOUNTER — Ambulatory Visit: Payer: Medicare Other | Admitting: Physical Therapy

## 2021-10-15 ENCOUNTER — Other Ambulatory Visit: Payer: Self-pay

## 2021-10-15 ENCOUNTER — Encounter: Payer: Self-pay | Admitting: Physical Therapy

## 2021-10-15 DIAGNOSIS — R2681 Unsteadiness on feet: Secondary | ICD-10-CM

## 2021-10-15 DIAGNOSIS — R41841 Cognitive communication deficit: Secondary | ICD-10-CM | POA: Diagnosis not present

## 2021-10-15 DIAGNOSIS — R41840 Attention and concentration deficit: Secondary | ICD-10-CM | POA: Diagnosis not present

## 2021-10-15 DIAGNOSIS — M6281 Muscle weakness (generalized): Secondary | ICD-10-CM | POA: Diagnosis not present

## 2021-10-15 DIAGNOSIS — R2689 Other abnormalities of gait and mobility: Secondary | ICD-10-CM

## 2021-10-15 DIAGNOSIS — R1310 Dysphagia, unspecified: Secondary | ICD-10-CM | POA: Diagnosis not present

## 2021-10-15 NOTE — Therapy (Signed)
[facility] [address], [place], [place], [address] Phone: [phone]   Fax:  [phone]  Physical Therapy Treatment  Patient Details  Name: Kayla Harrison MRN: [id number] Date of Birth: 1945-10-10 Referring Provider (PT): Garvin Fila, MD   Encounter Date: 10/15/2021   PT End of Session - 10/15/21 1413     Visit Number 16    Number of Visits 24    Date for PT Re-Evaluation 11/09/21    Authorization Type Medicare & MoO    PT Start Time 1319    PT Stop Time 1403    PT Time Calculation (min) 44 min    Equipment Utilized During Treatment Gait belt    Activity Tolerance Patient tolerated treatment well   brief seated rest breaks; HR check mid-session, 80 bpm   Behavior During Therapy WFL for tasks assessed/performed             Past Medical History:  Diagnosis Date   Anemia    Arthritis    osteoarthritis - knees and right shoulder   Blood transfusion without reported diagnosis    Breast cancer ([place])    Dr Margot Chimes, total thyroidectomy- 1999- for cancer   Brucellosis 1964   Chronic bilateral pleural effusions    Colon polyp    Dr Earlean Shawl   Complication of anesthesia    Ketamine produces LSD reaction, bright colored nightmarish experience    Dyslipidemia    Endometriosis    Fibroids    H/O pleural effusion    s/p thoracentesis w 3269ml withdrawn   Hepatitis    [place] ?hepatitis    History of dysphagia    due to radiation therapy   History of hiatal hernia    small noted on PET scan   Hypertension    Hypothyroidism    Lung cancer, lower lobe ([place]) 01/2017   radiation RX completed 03/04/17; will start chemo 6/27, pt unaware of lung cancer   Morbid obesity ([place])    Status post lap band surgery   Nephrolithiasis    Non Hodgkin's lymphoma ([place])    on chemotherapy   Persistent atrial fibrillation ([place])    a. s/p PVI 2008 b. s/p convergent ablation 1102 complicated by  bradycardia requiring pacemaker implant   Personal history of radiation therapy    Presence of permanent cardiac pacemaker    Rotator cuff tear    Right   Stroke ([place])    2003- [place] x2   SVC syndrome    with lung mass and non hodgkins lymphoma   Thyroid cancer ([place]) 2000    Past Surgical History:  Procedure Laterality Date   ABDOMINAL HYSTERECTOMY  1983   afib ablation     a. 2008 PVI b. 2014 convergent ablation   APPENDECTOMY     BONE MARROW BIOPSY  02/21/2017   BREAST LUMPECTOMY Left 2010   [place]     2015- negative   CARDIOVERSION  10/09/2012   Procedure: CARDIOVERSION;  Surgeon: Minus Breeding, MD;  Location: [place];  Service: Cardiovascular;  Laterality: N/A;   CARDIOVERSION  10/09/2012   Procedure: CARDIOVERSION;  Surgeon: Minus Breeding, MD;  Location: [facility] ENDOSCOPY;  Service: Cardiovascular;  Laterality: N/A;  [name] gave the ok to add pt to the add on , but we must check to find out if the can add pt on at 1400 ( [phone])   CARDIOVERSION N/A 11/20/2012  Procedure: CARDIOVERSION;  Surgeon: Fay Records, MD;  Location: [place];  Service: Cardiovascular;  Laterality: N/A;   CARDIOVERSION N/A 07/18/2017   Procedure: CARDIOVERSION;  Surgeon: Thayer Headings, MD;  Location: [place];  Service: Cardiovascular;  Laterality: N/A;   CARDIOVERSION N/A 10/03/2017   Procedure: CARDIOVERSION;  Surgeon: Sanda Klein, MD;  Location: [facility] ENDOSCOPY;  Service: Cardiovascular;  Laterality: N/A;   CARDIOVERSION N/A 01/07/2018   Procedure: CARDIOVERSION;  Surgeon: Acie Fredrickson Wonda Cheng, MD;  Location: [facility] ENDOSCOPY;  Service: Cardiovascular;  Laterality: N/A;   CARDIOVERSION N/A 12/10/2019   Procedure: CARDIOVERSION;  Surgeon: Buford Dresser, MD;  Location: [facility] ENDOSCOPY;  Service: Cardiovascular;  Laterality: N/A;   CARDIOVERSION N/A 03/09/2021   Procedure: CARDIOVERSION;  Surgeon: Pixie Casino, MD;  Location: [place];  Service: Cardiovascular;   Laterality: N/A;   CARDIOVERSION N/A 10/04/2021   Procedure: CARDIOVERSION;  Surgeon: Jerline Pain, MD;  Location: [place];  Service: Cardiovascular;  Laterality: N/A;   CHOLECYSTECTOMY     COLONOSCOPY W/ POLYPECTOMY     Dr Earlean Shawl   CYSTOSCOPY N/A 02/06/2015   Procedure: CYSTOSCOPY;  Surgeon: Kathie Rhodes, MD;  Location: [facility];  Service: Urology;  Laterality: N/A;   CYSTOSCOPY W/ RETROGRADES Left 11/17/2017   Procedure: CYSTOSCOPY WITH RETROGRADE /PYELOGRAM/;  Surgeon: Kathie Rhodes, MD;  Location: [facility];  Service: Urology;  Laterality: Left;   CYSTOSCOPY WITH RETROGRADE PYELOGRAM, URETEROSCOPY AND STENT PLACEMENT Right 02/06/2015   Procedure: RETROGRADE PYELOGRAM, RIGHT URETEROSCOPY STENT PLACEMENT;  Surgeon: Kathie Rhodes, MD;  Location: [facility];  Service: Urology;  Laterality: Right;   CYSTOSCOPY WITH RETROGRADE PYELOGRAM, URETEROSCOPY AND STENT PLACEMENT Right 03/07/2017   Procedure: CYSTOSCOPY WITH RIGHT RETROGRADE PYELOGRAM,RIGHT  URETEROSCOPYLASER LITHOTRIPSY  AND STENT PLACEMENT AND STONE BASKETRY;  Surgeon: Kathie Rhodes, MD;  Location: [place];  Service: Urology;  Laterality: Right;   EYE SURGERY     cataract surgery   fatty mass removal  1999   pubic area   HOLMIUM LASER APPLICATION N/A 12/17/7122   Procedure: HOLMIUM LASER APPLICATION;  Surgeon: Kathie Rhodes, MD;  Location: [facility];  Service: Urology;  Laterality: N/A;   HOLMIUM LASER APPLICATION Right 5/80/9983   Procedure: HOLMIUM LASER APPLICATION;  Surgeon: Kathie Rhodes, MD;  Location[facility];  Service: Urology;  Laterality: Right;   HOLMIUM LASER APPLICATION Left 3/82/5053   Procedure: HOLMIUM LASER APPLICATION;  Surgeon: Kathie Rhodes, MD;  Location: [facility];  Service: Urology;  Laterality: Left;   I & D EXTREMITY Left 12/19/2020   Procedure: IRRIGATION AND DEBRIDEMENT OF LEFT HIP HEMATOMA WITH APPLICATION OF WOUND VAC;  Surgeon: Erle Crocker, MD;  Location: [place];  Service:  Orthopedics;  Laterality: Left;   I & D EXTREMITY Left 02/06/2021   Procedure: IRRIGATION AND DEBRIDEMENT DEEP ABCESS LEFT THIGH, SECONDARY CLOSURE OF WOUND DEHISCENCE;  Surgeon: Erle Crocker, MD;  Location: [place];  Service: Orthopedics;  Laterality: Left;   IR FLUORO GUIDE PORT INSERTION RIGHT  02/24/2017   IR NEPHROSTOMY PLACEMENT RIGHT  11/17/2017   IR PATIENT EVAL TECH 0-60 MINS  03/11/2017   IR REMOVAL TUN ACCESS W/ PORT W/O FL MOD SED  04/20/2018   IR US GUIDE VASC ACCESS RIGHT  02/24/2017   KNEE ARTHROSCOPY     bilateral   LAPAROSCOPIC GASTRIC BANDING  07/10/2010   LAPAROSCOPIC GASTRIC BANDING     Laparoscopic adjustable banding APS System with posterior hiatal hernia, 2 suture.   LAPAROTOMY  for ruptured ovary and ovarian artery    NEPHROLITHOTOMY Right 11/17/2017   Procedure: NEPHROLITHOTOMY PERCUTANEOUS;  Surgeon: Kathie Rhodes, MD;  [facility];  Service: Urology;  Laterality: Right;   PACEMAKER INSERTION  03/10/2013   MDT dual chamber PPM   POCKET REVISION N/A 12/08/2013   Procedure: POCKET REVISION;  Surgeon: Deboraha Sprang, MD;  Location: [facility] CATH LAB;  Service: Cardiovascular;  Laterality: N/A;   PORTA CATH INSERTION     REVERSE SHOULDER ARTHROPLASTY Right 05/14/2018   Procedure: RIGHT REVERSE SHOULDER ARTHROPLASTY;  Surgeon: Tania Ade, MD;  Location: [place];  Service: Orthopedics;  Laterality: Right;   REVERSE SHOULDER REPLACEMENT Right 05/14/2018   RIGHT HEART CATH N/A 07/21/2019   Procedure: RIGHT HEART CATH;  Surgeon: Larey Dresser, MD;  Location: [place] CV LAB;  Service: Cardiovascular;  Laterality: N/A;   TEE WITH CARDIOVERSION  09/22/2017   TEE WITHOUT CARDIOVERSION N/A 10/03/2017   Procedure: TRANSESOPHAGEAL ECHOCARDIOGRAM (TEE);  Surgeon: Sanda Klein, MD;  Location: [facility] ENDOSCOPY;  Service: Cardiovascular;  Laterality: N/A;   TEE WITHOUT CARDIOVERSION N/A 08/04/2019   Procedure: TRANSESOPHAGEAL ECHOCARDIOGRAM (TEE);  Surgeon: Larey Dresser, MD;  Location: [facility] ENDOSCOPY;  Service: Cardiovascular;  Laterality: N/A;   TEE WITHOUT CARDIOVERSION N/A 12/10/2019   Procedure: TRANSESOPHAGEAL ECHOCARDIOGRAM (TEE);  Surgeon: Buford Dresser, MD;  Location: [facility] ENDOSCOPY;  Service: Cardiovascular;  Laterality: N/A;   TEE WITHOUT CARDIOVERSION N/A 03/09/2021   Procedure: TRANSESOPHAGEAL ECHOCARDIOGRAM (TEE);  Surgeon: Pixie Casino, MD;  Location: [facility] ENDOSCOPY;  Service: Cardiovascular;  Laterality: N/A;   TEE WITHOUT CARDIOVERSION N/A 10/04/2021   Procedure: TRANSESOPHAGEAL ECHOCARDIOGRAM (TEE);  Surgeon: Jerline Pain, MD;  Location: [facility] ENDOSCOPY;  Service: Cardiovascular;  Laterality: N/A;   THYROIDECTOMY  1998   Dr Margot Chimes   TONSILLECTOMY     TOTAL KNEE ARTHROPLASTY  04/13/2012   Procedure: TOTAL KNEE ARTHROPLASTY;  Surgeon: Rudean Haskell, MD;  Location: [place];  Service: Orthopedics;  Laterality: Right;   VIDEO BRONCHOSCOPY WITH ENDOBRONCHIAL ULTRASOUND N/A 02/07/2017   Procedure: VIDEO BRONCHOSCOPY WITH ENDOBRONCHIAL ULTRASOUND;  Surgeon: Marshell Garfinkel, MD;  Location: [place];  Service: Pulmonary;  Laterality: N/A;    There were no vitals filed for this visit.   Subjective Assessment - 10/15/21 1321     Subjective Had a good week last week. Denies recent falls.    Pertinent History anemia, breast CA s/p surgery, lung CA s/p radiation and chemo, lymphoma on chemo, thyroid CA, HLD, hiatal hernia, HTN, a-fib, pacemaker, R reverse TSA 2019, stroke 2003, SVC syndrome, R TKA 2013    Diagnostic tests 03/15/21 head CT: Similar appearance of left caudothalamic hemorrhage with  intraventricular extension and mild adjacent edema. No new  hemorrhage. Unchanged mild trapping of the left lateral ventricle    Patient Stated Goals be able to get up off the floor    Currently in Pain? No/denies                               [facility] Adult PT Treatment/Exercise - 10/15/21 0001       Neuro Re-ed    Neuro Re-ed Details   R/L wt shift holding ball 10x; x10 alt toe tap on 6"      Knee/Hip Exercises: Stretches   Hip Flexor Stretch Right;2 reps;30 seconds    Hip Flexor Stretch Limitations mod thomas      Knee/Hip Exercises: Aerobic   Nustep Level 4  all 4 extremities x 6 minutes with steps per minute >75   cued patient to maintain steady speed     Knee/Hip Exercises: Standing   Hip Flexion Stengthening;Right;Left;1 set;20 reps    Hip Flexion Limitations resisted march with red TB at counter top      Knee/Hip Exercises: Seated   Sit to Sand 5 reps;with UE support;2 sets   elevated on airex, pushing off knees, cues to scoot to edge and tuck feet back     Knee/Hip Exercises: Supine   Bridges Strengthening;Both;10 reps;Other (comment);Right;Left;1 set    Bridges with Clamshell Strengthening;Both;1 set;10 reps   red TB   Other Supine Knee/Hip Exercises red TB clam in hooklying x10; hooklying march 20x      Manual Therapy   Manual Therapy Soft tissue mobilization;Myofascial release    Manual therapy comments supine    Soft tissue mobilization STM to R hip flexor and quads    Myofascial Release manual TPR to STM to R hip flexor and quads   c/o TTP and palpable taut band of muscle in iliopsoas                    PT Education - 10/15/21 1413     Education Details review of HEP for carryover and form-Access Code: [email or web address]; spoke to patient about her phone as care management is trying to get ahold of her- voicemail box appears to be "disconnected from network" and not able to be used. Patient reports plans to get her phone looked at at [facility]) Educated Patient    Methods Explanation;Demonstration;Tactile cues;Verbal cues    Comprehension Verbalized understanding;Returned demonstration              PT Short Term Goals - 09/28/21 1050       PT SHORT TERM GOAL #1   Title Patient to be independent with initial HEP.    Time 3    Period Weeks    Status Achieved    Target Date  08/08/21               PT Long Term Goals - 09/28/21 1051       PT LONG TERM GOAL #1   Title Patient to be independent with advanced HEP.    Time 6    Period Weeks    Status On-going   limited compliance d/t medical concerns/issues   Target Date 11/09/21      PT LONG TERM GOAL #2   Title Patient to demonstrate B LE strength >/=4+/5.    Time 6    Period Weeks    Status Partially Met   weakness still remaining   Target Date 11/09/21      PT LONG TERM GOAL #3   Title Patient to score at least 46/56 on [place] in order to decrease risk of falls.    Time 6    Period Weeks    Status On-going   34/56   Target Date 11/09/21      PT LONG TERM GOAL #4   Title Patient to complete TUG in <14 sec with LRAD in order to decrease risk of falls.    Time 6    Period Weeks    Status On-going   18.7 sec with [facility]   Target Date 11/09/21      PT LONG TERM GOAL #5   Title Patient to demonstrate 5xSTS test in <15 sec in order to decrease risk of falls.  Time 6    Period Weeks    Status On-going   requires increased time d/t attempting without UEs; 50 sec   Target Date 11/09/21      PT LONG TERM GOAL #6   Title Patient to demonstrate safe floor transfer with mod I.    Time 6    Period Weeks    Status Deferred   NT d/t safety   Target Date 11/09/21                   Plan - 10/15/21 1414     Clinical Impression Statement Patient arrived to session without new complaints. Worked on STS transfers, however patient with difficulty, requiring cues for proper transfer set up, increased anterior trunk lean, and elevation of seat. Patient reported R hip flexor pain after 1st set of transfers. This was addressed with MT and hip flexor stretching. Patient with palpable taut band in iliopsoas. Able to perform mat ther-ex for hip strengthening without c/o pain. Standing balance and strengthening activities were also pain-free. Heavy 1 UE support required with toe taps d/t imbalance. Patient  denied pain and without complaints at end of session.    Personal Factors and Comorbidities Age;Fitness;Comorbidity 3+;Transportation;Time since onset of injury/illness/exacerbation;Past/Current Experience    Comorbidities anemia, breast CA s/p surgery, lung CA s/p radiation and chemo, lymphoma on chemo, thyroid CA, HLD, hiatal hernia, HTN, a-fib, pacemaker, R reverse TSA 2019, stroke 2003, SVC syndrome, R TKA 2013    Examination-Activity Limitations Bathing;Locomotion Level;Transfers;Bed Mobility;Reach Overhead;Bend;Carry;Squat;Dressing;Stairs;Stand;Hygiene/Grooming;Lift;Toileting    Examination-Participation Restrictions Laundry;Shop;Community Activity;Cleaning;Church;Meal Prep    Stability/Clinical Decision Making Stable/Uncomplicated    Rehab Potential Good    PT Frequency Other (comment)   1-2x   PT Duration 6 weeks    PT Treatment/Interventions ADLs/Self Care Home Management;Cryotherapy;Electrical Stimulation;DME Instruction;Moist Heat;Gait training;Stair training;Functional mobility training;Therapeutic activities;Therapeutic exercise;Balance training;Neuromuscular re-education;Manual techniques;Patient/family education;Passive range of motion;Dry needling;Energy conservation;Vestibular;Vasopneumatic Device;Taping    PT Next Visit Plan Continue balance training with SLS activities; Continue to progress BLE stretching and functional strengthening to improve posture and gait deviations; continue gait training with SPC as appropriate.  Encourage gait with RW when not in therapy sessions.    Consulted and Agree with Plan of Care Patient             Patient will benefit from skilled therapeutic intervention in order to improve the following deficits and impairments:  Abnormal gait, Decreased range of motion, Difficulty walking, Increased fascial restricitons, Increased muscle spasms, Decreased safety awareness, Decreased endurance, Cardiopulmonary status limiting activity, Decreased activity  tolerance, Decreased balance, Impaired flexibility, Improper body mechanics, Postural dysfunction, Decreased strength  Visit Diagnosis: Muscle weakness (generalized)  Unsteadiness on feet  Other abnormalities of gait and mobility     Problem List Patient Active Problem List   Diagnosis Date Noted   Hypertension    Pressure injury of skin 03/15/2021   Intracranial hemorrhage ([place]) 03/14/2021   Lower extremity edema    Polymicrobial bacterial infection    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    Enterococcus faecalis infection    Sepsis due to Escherichia coli ([place])    Debility 02/13/2021   Infection of wound hematoma 02/05/2021   Physical debility 12/22/2020   Tear of left hamstring 12/22/2020   Labral tear of left hip joint 12/22/2020   Acute blood loss anemia    Orthostatic hypotension 12/17/2020   History of CVA (cerebrovascular accident) 12/17/2020   Fall 12/15/2020   Hip hematoma, left, initial encounter 12/15/2020   Nondisplaced fracture  of coronoid process of left ulna, initial encounter for closed fracture 12/15/2020   Multiple rib fractures 12/15/2020   COVID-19 virus infection 10/12/2020   Constipation 06/22/2020   Change in bowel habits 06/22/2020   Diverticulosis 06/22/2020   Dark stools 06/22/2020   Hemorrhoids 06/22/2020   Pacemaker 01/23/2020   Junctional rhythm 01/23/2020   Palpitations 01/23/2020   ICH (intracerebral hemorrhage) ([place]) 12/02/2019   A-fib ([place]) 12/02/2019   Anemia 12/02/2019   QT prolongation 12/02/2019   Hypothyroidism 12/02/2019   Gait abnormality 07/10/2018   S/P reverse total shoulder arthroplasty, 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   Persistent atrial fibrillation ([place])    Bilateral ureteral calculi 11/17/2017   Atrial fibrillation with RVR ([place]) 09/15/2017   Atrial flutter ([place]) 07/17/2017   Port catheter in place 04/02/2017   Non-Hodgkin lymphoma, unspecified, intrathoracic lymph nodes ([place]) 02/13/2017   Mediastinal mass 02/06/2017    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   Abnormal chest x-ray    Lung mass 02/03/2017   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   OSA (obstructive sleep apnea) 02/03/2015   History of renal calculi 11/16/2014   Renal calculi 11/16/2014   Family history of colon cancer 10/26/2014   Mechanical complication due to cardiac pacemaker pulse generator 11/06/2013   Dyspnea 05/12/2013   Hypothyroidism 04/21/2013   Pleural effusion 04/19/2013   Long term (current) use of anticoagulants 12/20/2010   EPIDERMOID CYST 08/22/2010   Chronic diastolic heart failure ([place]) 08/06/2010   SYNCOPE AND COLLAPSE 07/27/2010   OSTEOARTHRITIS, KNEE, RIGHT 03/15/2010   Class 1 obesity due to excess calories with body mass index (BMI) of 33.0 to 33.9 in adult 12/07/2009   NONSPEC ELEVATION OF LEVELS OF TRANSAMINASE/LDH 09/08/2009   BREAST CANCER, HX OF 07/25/2009   COLONIC POLYPS, HX OF 07/25/2009   TUBULOVILLOUS ADENOMA, 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 [place], [place], [address] Phone: [phone]   Fax:  [phone]  Name: MICHEL ESKELSON MRN: [id number] Date of Birth: 01-Nov-1945

## 2021-10-17 ENCOUNTER — Ambulatory Visit: Payer: Medicare Other | Admitting: Physical Therapy

## 2021-10-20 ENCOUNTER — Other Ambulatory Visit: Payer: Self-pay | Admitting: Internal Medicine

## 2021-10-22 NOTE — Telephone Encounter (Signed)
Prescription refill request for Eliquis received. Indication: afib  Last office visit: Caryl Comes 07/24/2021 Scr: 1.13, 08/24/2021 Age: 76 yo  Weight: 78 kg   Refill sent.

## 2021-10-23 ENCOUNTER — Ambulatory Visit: Payer: Medicare Other | Admitting: Physical Therapy

## 2021-10-23 DIAGNOSIS — H5340 Unspecified visual field defects: Secondary | ICD-10-CM | POA: Diagnosis not present

## 2021-10-23 DIAGNOSIS — H35443 Age-related reticular degeneration of retina, bilateral: Secondary | ICD-10-CM | POA: Diagnosis not present

## 2021-10-23 DIAGNOSIS — I639 Cerebral infarction, unspecified: Secondary | ICD-10-CM | POA: Diagnosis not present

## 2021-10-25 ENCOUNTER — Ambulatory Visit: Payer: Medicare Other | Attending: Neurology | Admitting: Physical Therapy

## 2021-10-25 DIAGNOSIS — R2689 Other abnormalities of gait and mobility: Secondary | ICD-10-CM | POA: Insufficient documentation

## 2021-10-25 DIAGNOSIS — R2681 Unsteadiness on feet: Secondary | ICD-10-CM | POA: Insufficient documentation

## 2021-10-25 DIAGNOSIS — R41840 Attention and concentration deficit: Secondary | ICD-10-CM | POA: Insufficient documentation

## 2021-10-25 DIAGNOSIS — M6281 Muscle weakness (generalized): Secondary | ICD-10-CM | POA: Insufficient documentation

## 2021-10-30 ENCOUNTER — Encounter: Payer: Self-pay | Admitting: Physical Therapy

## 2021-10-30 ENCOUNTER — Encounter: Payer: Self-pay | Admitting: Occupational Therapy

## 2021-10-30 ENCOUNTER — Ambulatory Visit: Payer: Medicare Other | Admitting: Occupational Therapy

## 2021-10-30 ENCOUNTER — Other Ambulatory Visit: Payer: Self-pay

## 2021-10-30 ENCOUNTER — Ambulatory Visit: Payer: Medicare Other | Admitting: Physical Therapy

## 2021-10-30 VITALS — BP 134/80 | HR 70

## 2021-10-30 DIAGNOSIS — R41840 Attention and concentration deficit: Secondary | ICD-10-CM

## 2021-10-30 DIAGNOSIS — M6281 Muscle weakness (generalized): Secondary | ICD-10-CM

## 2021-10-30 DIAGNOSIS — R2689 Other abnormalities of gait and mobility: Secondary | ICD-10-CM

## 2021-10-30 DIAGNOSIS — R2681 Unsteadiness on feet: Secondary | ICD-10-CM | POA: Diagnosis not present

## 2021-10-30 NOTE — Therapy (Signed)
[facility] [address], [place], [place], [address] Phone: [phone]   Fax:  [phone]  Physical Therapy Treatment  Patient Details  Name: Kayla Harrison MRN: [id number] Date of Birth: Apr 12, 1946 Referring Provider (PT): Garvin Fila, MD   Encounter Date: 10/30/2021   PT End of Session - 10/30/21 1238     Visit Number 17    Number of Visits 24    Date for PT Re-Evaluation 11/09/21    Authorization Type Medicare & MoO    PT Start Time 1153    PT Stop Time 1234    PT Time Calculation (min) 41 min    Equipment Utilized During Treatment Gait belt    Activity Tolerance Patient tolerated treatment well   brief seated rest breaks; HR check mid-session, 80 bpm   Behavior During Therapy WFL for tasks assessed/performed             Past Medical History:  Diagnosis Date   Anemia    Arthritis    osteoarthritis - knees and right shoulder   Blood transfusion without reported diagnosis    Breast cancer ([place])    Dr Margot Chimes, total thyroidectomy- 1999- for cancer   Brucellosis 1964   Chronic bilateral pleural effusions    Colon polyp    Dr Earlean Shawl   Complication of anesthesia    Ketamine produces LSD reaction, bright colored nightmarish experience    Dyslipidemia    Endometriosis    Fibroids    H/O pleural effusion    s/p thoracentesis w 3231ml withdrawn   Hepatitis    [place] ?hepatitis    History of dysphagia    due to radiation therapy   History of hiatal hernia    small noted on PET scan   Hypertension    Hypothyroidism    Lung cancer, lower lobe ([place]) 01/2017   radiation RX completed 03/04/17; will start chemo 6/27, pt unaware of lung cancer   Morbid obesity ([place])    Status post lap band surgery   Nephrolithiasis    Non Hodgkin's lymphoma ([place])    on chemotherapy   Persistent atrial fibrillation ([place])    a. s/p PVI 2008 b. s/p convergent ablation 4259 complicated by  bradycardia requiring pacemaker implant   Personal history of radiation therapy    Presence of permanent cardiac pacemaker    Rotator cuff tear    Right   Stroke ([place])    2003- [place] x2   SVC syndrome    with lung mass and non hodgkins lymphoma   Thyroid cancer ([place]) 2000    Past Surgical History:  Procedure Laterality Date   ABDOMINAL HYSTERECTOMY  1983   afib ablation     a. 2008 PVI b. 2014 convergent ablation   APPENDECTOMY     BONE MARROW BIOPSY  02/21/2017   BREAST LUMPECTOMY Left 2010   [place]     2015- negative   CARDIOVERSION  10/09/2012   Procedure: CARDIOVERSION;  Surgeon: Minus Breeding, MD;  Location: [place];  Service: Cardiovascular;  Laterality: N/A;   CARDIOVERSION  10/09/2012   Procedure: CARDIOVERSION;  Surgeon: Minus Breeding, MD;  Location: [facility] ENDOSCOPY;  Service: Cardiovascular;  Laterality: N/A;  [name] gave the ok to add pt to the add on , but we must check to find out if the can add pt on at [phone] ( [phone])   CARDIOVERSION N/A 11/20/2012  Procedure: CARDIOVERSION;  Surgeon: Fay Records, MD;  Location: [place];  Service: Cardiovascular;  Laterality: N/A;   CARDIOVERSION N/A 07/18/2017   Procedure: CARDIOVERSION;  Surgeon: Thayer Headings, MD;  Location: [place];  Service: Cardiovascular;  Laterality: N/A;   CARDIOVERSION N/A 10/03/2017   Procedure: CARDIOVERSION;  Surgeon: Sanda Klein, MD;  Location: [facility] ENDOSCOPY;  Service: Cardiovascular;  Laterality: N/A;   CARDIOVERSION N/A 01/07/2018   Procedure: CARDIOVERSION;  Surgeon: Acie Fredrickson Wonda Cheng, MD;  Location: [facility] ENDOSCOPY;  Service: Cardiovascular;  Laterality: N/A;   CARDIOVERSION N/A 12/10/2019   Procedure: CARDIOVERSION;  Surgeon: Buford Dresser, MD;  Location: [facility] ENDOSCOPY;  Service: Cardiovascular;  Laterality: N/A;   CARDIOVERSION N/A 03/09/2021   Procedure: CARDIOVERSION;  Surgeon: Pixie Casino, MD;  Location: [place];  Service: Cardiovascular;   Laterality: N/A;   CARDIOVERSION N/A 10/04/2021   Procedure: CARDIOVERSION;  Surgeon: Jerline Pain, MD;  Location: [place];  Service: Cardiovascular;  Laterality: N/A;   CHOLECYSTECTOMY     COLONOSCOPY W/ POLYPECTOMY     Dr Earlean Shawl   CYSTOSCOPY N/A 02/06/2015   Procedure: CYSTOSCOPY;  Surgeon: Kathie Rhodes, MD;  Location: [facility];  Service: Urology;  Laterality: N/A;   CYSTOSCOPY W/ RETROGRADES Left 11/17/2017   Procedure: CYSTOSCOPY WITH RETROGRADE /PYELOGRAM/;  Surgeon: Kathie Rhodes, MD;  Location: [facility];  Service: Urology;  Laterality: Left;   CYSTOSCOPY WITH RETROGRADE PYELOGRAM, URETEROSCOPY AND STENT PLACEMENT Right 02/06/2015   Procedure: RETROGRADE PYELOGRAM, RIGHT URETEROSCOPY STENT PLACEMENT;  Surgeon: Kathie Rhodes, MD;  Location: [facility];  Service: Urology;  Laterality: Right;   CYSTOSCOPY WITH RETROGRADE PYELOGRAM, URETEROSCOPY AND STENT PLACEMENT Right 03/07/2017   Procedure: CYSTOSCOPY WITH RIGHT RETROGRADE PYELOGRAM,RIGHT  URETEROSCOPYLASER LITHOTRIPSY  AND STENT PLACEMENT AND STONE BASKETRY;  Surgeon: Kathie Rhodes, MD;  Location: [place];  Service: Urology;  Laterality: Right;   EYE SURGERY     cataract surgery   fatty mass removal  1999   pubic area   HOLMIUM LASER APPLICATION N/A 1/63/8466   Procedure: HOLMIUM LASER APPLICATION;  Surgeon: Kathie Rhodes, MD;  Location: [facility];  Service: Urology;  Laterality: N/A;   HOLMIUM LASER APPLICATION Right 5/99/3570   Procedure: HOLMIUM LASER APPLICATION;  Surgeon: Kathie Rhodes, MD;  Location: [facility];  Service: Urology;  Laterality: Right;   HOLMIUM LASER APPLICATION Left 1/77/9390   Procedure: HOLMIUM LASER APPLICATION;  Surgeon: Kathie Rhodes, MD;  Location: [facility];  Service: Urology;  Laterality: Left;   I & D EXTREMITY Left 12/19/2020   Procedure: IRRIGATION AND DEBRIDEMENT OF LEFT HIP HEMATOMA WITH APPLICATION OF WOUND VAC;  Surgeon: Erle Crocker, MD;  Location: [place];  Service:  Orthopedics;  Laterality: Left;   I & D EXTREMITY Left 02/06/2021   Procedure: IRRIGATION AND DEBRIDEMENT DEEP ABCESS LEFT THIGH, SECONDARY CLOSURE OF WOUND DEHISCENCE;  Surgeon: Erle Crocker, MD;  Location: [place];  Service: Orthopedics;  Laterality: Left;   IR FLUORO GUIDE PORT INSERTION RIGHT  02/24/2017   IR NEPHROSTOMY PLACEMENT RIGHT  11/17/2017   IR PATIENT EVAL TECH 0-60 MINS  03/11/2017   IR REMOVAL TUN ACCESS W/ PORT W/O FL MOD SED  04/20/2018   IR US GUIDE VASC ACCESS RIGHT  02/24/2017   KNEE ARTHROSCOPY     bilateral   LAPAROSCOPIC GASTRIC BANDING  07/10/2010   LAPAROSCOPIC GASTRIC BANDING     Laparoscopic adjustable banding APS System with posterior hiatal hernia, 2 suture.   LAPAROTOMY  for ruptured ovary and ovarian artery    NEPHROLITHOTOMY Right 11/17/2017   Procedure: NEPHROLITHOTOMY PERCUTANEOUS;  Surgeon: Kathie Rhodes, MD;  Location: [facility];  Service: Urology;  Laterality: Right;   PACEMAKER INSERTION  03/10/2013   MDT dual chamber PPM   POCKET REVISION N/A 12/08/2013   Procedure: POCKET REVISION;  Surgeon: Deboraha Sprang, MD;  Location: [facility] CATH LAB;  Service: Cardiovascular;  Laterality: N/A;   PORTA CATH INSERTION     REVERSE SHOULDER ARTHROPLASTY Right 05/14/2018   Procedure: RIGHT REVERSE SHOULDER ARTHROPLASTY;  Surgeon: Tania Ade, MD;  Location: [place];  Service: Orthopedics;  Laterality: Right;   REVERSE SHOULDER REPLACEMENT Right 05/14/2018   RIGHT HEART CATH N/A 07/21/2019   Procedure: RIGHT HEART CATH;  Surgeon: Larey Dresser, MD;  Location: [place] CV LAB;  Service: Cardiovascular;  Laterality: N/A;   TEE WITH CARDIOVERSION  09/22/2017   TEE WITHOUT CARDIOVERSION N/A 10/03/2017   Procedure: TRANSESOPHAGEAL ECHOCARDIOGRAM (TEE);  Surgeon: Sanda Klein, MD;  Location: [facility] ENDOSCOPY;  Service: Cardiovascular;  Laterality: N/A;   TEE WITHOUT CARDIOVERSION N/A 08/04/2019   Procedure: TRANSESOPHAGEAL ECHOCARDIOGRAM (TEE);  Surgeon: Larey Dresser, MD;  Location: [facility] ENDOSCOPY;  Service: Cardiovascular;  Laterality: N/A;   TEE WITHOUT CARDIOVERSION N/A 12/10/2019   Procedure: TRANSESOPHAGEAL ECHOCARDIOGRAM (TEE);  Surgeon: Buford Dresser, MD;  Location: [facility] ENDOSCOPY;  Service: Cardiovascular;  Laterality: N/A;   TEE WITHOUT CARDIOVERSION N/A 03/09/2021   Procedure: TRANSESOPHAGEAL ECHOCARDIOGRAM (TEE);  Surgeon: Pixie Casino, MD;  Location: [facility] ENDOSCOPY;  Service: Cardiovascular;  Laterality: N/A;   TEE WITHOUT CARDIOVERSION N/A 10/04/2021   Procedure: TRANSESOPHAGEAL ECHOCARDIOGRAM (TEE);  Surgeon: Jerline Pain, MD;  Location: [facility] ENDOSCOPY;  Service: Cardiovascular;  Laterality: N/A;   THYROIDECTOMY  1998   Dr Margot Chimes   TONSILLECTOMY     TOTAL KNEE ARTHROPLASTY  04/13/2012   Procedure: TOTAL KNEE ARTHROPLASTY;  Surgeon: Rudean Haskell, MD;  Location: [place];  Service: Orthopedics;  Laterality: Right;   VIDEO BRONCHOSCOPY WITH ENDOBRONCHIAL ULTRASOUND N/A 02/07/2017   Procedure: VIDEO BRONCHOSCOPY WITH ENDOBRONCHIAL ULTRASOUND;  Surgeon: Marshell Garfinkel, MD;  Location: [place];  Service: Pulmonary;  Laterality: N/A;    There were no vitals filed for this visit.   Subjective Assessment - 10/30/21 1155     Subjective Reports that a couple weeks ago she was putting down a rug near the shower, fell foward to grab something, but ended up falling backwards. Ended up being stuck between the toilet and the sink. EMS helped her up. Has been having pain in her R LB since 2 days after the fall. Worse with sitting and standing up from sitting. Denies head trauma, N/T, weakness.    Pertinent History anemia, breast CA s/p surgery, lung CA s/p radiation and chemo, lymphoma on chemo, thyroid CA, HLD, hiatal hernia, HTN, a-fib, pacemaker, R reverse TSA 2019, stroke 2003, SVC syndrome, R TKA 2013    Diagnostic tests 03/15/21 head CT: Similar appearance of left caudothalamic hemorrhage with  intraventricular extension and mild adjacent edema. No new   hemorrhage. Unchanged mild trapping of the left lateral ventricle    Patient Stated Goals be able to get up off the floor    Currently in Pain? No/denies    Pain Score 0-No pain    Pain Location Rib cage    Pain Orientation Right    Pain Descriptors / Indicators Sore    Pain Type Acute pain  [facility] Adult PT Treatment/Exercise - 10/30/21 0001       Knee/Hip Exercises: Standing   Hip Abduction Stengthening;Right;Left;10 reps;Knee straight;2 sets    Abduction Limitations at handrail   trouble on R d/t L trendelenberg   Hip Extension Stengthening;Right;Left;10 reps;Knee straight;2 sets    Extension Limitations at handrail   trouble on R d/t L trendelenberg   Forward Step Up Right;Left;5 reps;Hand Hold: 2;Step Height: 6";2 sets    Forward Step Up Limitations cues for foot placement and standing upright    Other Standing Knee Exercises R hip dips on 4" step 3x5   manual cues     Knee/Hip Exercises: Seated   Sit to Sand 5 reps;2 sets;with UE support   pushing off knees; 1st set elevated on airex; 2nd set from normal surface                    PT Education - 10/30/21 1235     Education Details discussion on patient's recent fall and trouble with LB/rib pain; assisting patient with getting her therapy schedule in order d/t several missed appointments as a result of confusion; educated patient on current POC    Person(s) Educated Patient    Methods Explanation    Comprehension Verbalized understanding              PT Short Term Goals - 09/28/21 1050       PT SHORT TERM GOAL #1   Title Patient to be independent with initial HEP.    Time 3    Period Weeks    Status Achieved    Target Date 08/08/21               PT Long Term Goals - 09/28/21 1051       PT LONG TERM GOAL #1   Title Patient to be independent with advanced HEP.    Time 6    Period Weeks    Status On-going   limited compliance d/t medical  concerns/issues   Target Date 11/09/21      PT LONG TERM GOAL #2   Title Patient to demonstrate B LE strength >/=4+/5.    Time 6    Period Weeks    Status Partially Met   weakness still remaining   Target Date 11/09/21      PT LONG TERM GOAL #3   Title Patient to score at least 46/56 on [place] in order to decrease risk of falls.    Time 6    Period Weeks    Status On-going   34/56   Target Date 11/09/21      PT LONG TERM GOAL #4   Title Patient to complete TUG in <14 sec with LRAD in order to decrease risk of falls.    Time 6    Period Weeks    Status On-going   18.7 sec with [facility]   Target Date 11/09/21      PT LONG TERM GOAL #5   Title Patient to demonstrate 5xSTS test in <15 sec in order to decrease risk of falls.    Time 6    Period Weeks    Status On-going   requires increased time d/t attempting without UEs; 50 sec   Target Date 11/09/21      PT LONG TERM GOAL #6   Title Patient to demonstrate safe floor transfer with mod I.    Time 6    Period Weeks    Status Deferred  NT d/t safety   Target Date 11/09/21                   Plan - 10/30/21 1238     Clinical Impression Statement Patient arrived to session with report of falling a couple weeks ago, resulting in having to call EMS to get her off the ground. Notes pain in her R LB since 2 days after the fall. Worse with sitting and standing up from sitting. Denies head trauma, N/T, weakness. Patient TTP over R posterior lower ribs but without pain on inspiration.  Patient performed standing hip strengthening with trouble on R d/t L Trendelenburg. Slightly improved with cueing to stand tall through L LE. Able to demonstrate fairly good form with STS transfers today after practicing on elevated surface for ease. Patient tolerated session well and without complaints at end of session.    Personal Factors and Comorbidities Age;Fitness;Comorbidity 3+;Transportation;Time since onset of  injury/illness/exacerbation;Past/Current Experience    Comorbidities anemia, breast CA s/p surgery, lung CA s/p radiation and chemo, lymphoma on chemo, thyroid CA, HLD, hiatal hernia, HTN, a-fib, pacemaker, R reverse TSA 2019, stroke 2003, SVC syndrome, R TKA 2013    Examination-Activity Limitations Bathing;Locomotion Level;Transfers;Bed Mobility;Reach Overhead;Bend;Carry;Squat;Dressing;Stairs;Stand;Hygiene/Grooming;Lift;Toileting    Examination-Participation Restrictions Laundry;Shop;Community Activity;Cleaning;Church;Meal Prep    Stability/Clinical Decision Making Stable/Uncomplicated    Rehab Potential Good    PT Frequency Other (comment)   1-2x   PT Duration 6 weeks    PT Treatment/Interventions ADLs/Self Care Home Management;Cryotherapy;Electrical Stimulation;DME Instruction;Moist Heat;Gait training;Stair training;Functional mobility training;Therapeutic activities;Therapeutic exercise;Balance training;Neuromuscular re-education;Manual techniques;Patient/family education;Passive range of motion;Dry needling;Energy conservation;Vestibular;Vasopneumatic Device;Taping    PT Next Visit Plan Continue balance training with SLS activities; Continue to progress BLE stretching and functional strengthening to improve posture and gait deviations; continue gait training with SPC as appropriate.  Encourage gait with RW when not in therapy sessions.    Consulted and Agree with Plan of Care Patient             Patient will benefit from skilled therapeutic intervention in order to improve the following deficits and impairments:  Abnormal gait, Decreased range of motion, Difficulty walking, Increased fascial restricitons, Increased muscle spasms, Decreased safety awareness, Decreased endurance, Cardiopulmonary status limiting activity, Decreased activity tolerance, Decreased balance, Impaired flexibility, Improper body mechanics, Postural dysfunction, Decreased strength  Visit Diagnosis: Muscle weakness  (generalized)  Unsteadiness on feet  Other abnormalities of gait and mobility     Problem List Patient Active Problem List   Diagnosis Date Noted   Hypertension    Pressure injury of skin 03/15/2021   Intracranial hemorrhage (HCC) 03/14/2021   Lower extremity edema    Polymicrobial bacterial infection    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    Enterococcus faecalis infection    Sepsis due to Escherichia coli ([place])    Debility 02/13/2021   Infection of wound hematoma 02/05/2021   Physical debility 12/22/2020   Tear of left hamstring 12/22/2020   Labral tear of left hip joint 12/22/2020   Acute blood loss anemia    Orthostatic hypotension 12/17/2020   History of CVA (cerebrovascular accident) 12/17/2020   Fall 12/15/2020   Hip hematoma, left, initial encounter 12/15/2020   Nondisplaced fracture of coronoid process of left ulna, initial encounter for closed fracture 12/15/2020   Multiple rib fractures 12/15/2020   COVID-19 virus infection 10/12/2020   Constipation 06/22/2020   Change in bowel habits 06/22/2020   Diverticulosis 06/22/2020   Dark stools 06/22/2020   Hemorrhoids 06/22/2020   Pacemaker  01/23/2020   Junctional rhythm 01/23/2020   Palpitations 01/23/2020   ICH (intracerebral hemorrhage) ([place]) 12/02/2019   A-fib ([place]) 12/02/2019   Anemia 12/02/2019   QT prolongation 12/02/2019   Hypothyroidism 12/02/2019   Gait abnormality 07/10/2018   S/P reverse total shoulder arthroplasty, 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   Persistent atrial fibrillation ([place])    Bilateral ureteral calculi 11/17/2017   Atrial fibrillation with RVR ([place]) 09/15/2017   Atrial flutter ([place]) 07/17/2017   Port catheter in place 04/02/2017   Non-Hodgkin lymphoma, unspecified, intrathoracic lymph nodes ([place]) 02/13/2017   Mediastinal mass 02/06/2017   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   Abnormal chest x-ray    Lung mass 02/03/2017   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   OSA (obstructive  sleep apnea) 02/03/2015   History of renal calculi 11/16/2014   Renal calculi 11/16/2014   Family history of colon cancer 10/26/2014   Mechanical complication due to cardiac pacemaker pulse generator 11/06/2013   Dyspnea 05/12/2013   Hypothyroidism 04/21/2013   Pleural effusion 04/19/2013   Long term (current) use of anticoagulants 12/20/2010   EPIDERMOID CYST 08/22/2010   Chronic diastolic heart failure ([place]) 08/06/2010   SYNCOPE AND COLLAPSE 07/27/2010   OSTEOARTHRITIS, KNEE, RIGHT 03/15/2010   Class 1 obesity due to excess calories with body mass index (BMI) of 33.0 to 33.9 in adult 12/07/2009   NONSPEC ELEVATION OF LEVELS OF TRANSAMINASE/LDH 09/08/2009   BREAST CANCER, HX OF 07/25/2009   COLONIC POLYPS, HX OF 07/25/2009   TUBULOVILLOUS ADENOMA, COLON 04/29/2008   HYPERGLYCEMIA, FASTING 04/29/2008   HYPERLIPIDEMIA 06/22/2007   CARCINOMA, THYROID GLAND, HX OF 06/22/2007    Janene Harvey, PT, DPT 10/30/21 12:42 PM   [facility] [address] [place], [place], [address] Phone: [phone]   Fax:  [phone]  Name: Kayla Harrison MRN: [id number] Date of Birth: 15-Dec-1945

## 2021-10-30 NOTE — Therapy (Signed)
[facility] [place], [place], [place], [address] Phone: [phone]   Fax:  [phone]  Occupational Therapy Treatment  Patient Details  Name: Kayla Harrison MRN: [id number] Date of Birth: Apr 23, 1946 Referring Provider (OT): Leonie Man   Encounter Date: 10/30/2021   OT End of Session - 10/30/21 1239     Visit Number 16    Number of Visits 25    Date for OT Re-Evaluation 11/07/21    Authorization Type Medicare A and B    Authorization - Visit Number 6    Authorization - Number of Visits 10    Progress Note Due on Visit 10    OT Start Time 1236    OT Stop Time 1316    OT Time Calculation (min) 40 min    Activity Tolerance Patient limited by fatigue    Behavior During Therapy WFL for tasks assessed/performed             Past Medical History:  Diagnosis Date   Anemia    Arthritis    osteoarthritis - knees and right shoulder   Blood transfusion without reported diagnosis    Breast cancer ([place])    Dr Margot Chimes, total thyroidectomy- 1999- for cancer   Brucellosis 1964   Chronic bilateral pleural effusions    Colon polyp    Dr Earlean Shawl   Complication of anesthesia    Ketamine produces LSD reaction, bright colored nightmarish experience    Dyslipidemia    Endometriosis    Fibroids    H/O pleural effusion    s/p thoracentesis w 3244ml withdrawn   Hepatitis    [place], ?hepatitis    History of dysphagia    due to radiation therapy   History of hiatal hernia    small noted on PET scan   Hypertension    Hypothyroidism    Lung cancer, lower lobe ([place]) 01/2017   radiation RX completed 03/04/17; will start chemo 6/27, pt unaware of lung cancer   Morbid obesity ([place])    Status post lap band surgery   Nephrolithiasis    Non Hodgkin's lymphoma ([place])    on chemotherapy   Persistent atrial fibrillation ([place])    a. s/p PVI 2008 b. s/p convergent ablation 2841 complicated by bradycardia  requiring pacemaker implant   Personal history of radiation therapy    Presence of permanent cardiac pacemaker    Rotator cuff tear    Right   Stroke ([place])    2003- [place] x2   SVC syndrome    with lung mass and non hodgkins lymphoma   Thyroid cancer ([place]) 2000    Past Surgical History:  Procedure Laterality Date   ABDOMINAL HYSTERECTOMY  1983   afib ablation     a. 2008 PVI b. 2014 convergent ablation   APPENDECTOMY     BONE MARROW BIOPSY  02/21/2017   BREAST LUMPECTOMY Left 2010   [place]     2015- negative   CARDIOVERSION  10/09/2012   Procedure: CARDIOVERSION;  Surgeon: Minus Breeding, MD;  Location: [place];  Service: Cardiovascular;  Laterality: N/A;   CARDIOVERSION  10/09/2012   Procedure: CARDIOVERSION;  Surgeon: Minus Breeding, MD;  Location: [facility] ENDOSCOPY;  Service: Cardiovascular;  Laterality: N/A;  [name] gave the ok to add pt to the add on , but we must check to find out if the can add pt on at [phone] ( [phone])  CARDIOVERSION N/A 11/20/2012   Procedure: CARDIOVERSION;  Surgeon: Pricilla Riffle, MD;  Location: [facility] ENDOSCOPY;  Service: Cardiovascular;  Laterality: N/A;   CARDIOVERSION N/A 07/18/2017   Procedure: CARDIOVERSION;  Surgeon: Vesta Mixer, MD;  Location: [facility] ENDOSCOPY;  Service: Cardiovascular;  Laterality: N/A;   CARDIOVERSION N/A 10/03/2017   Procedure: CARDIOVERSION;  Surgeon: Thurmon Fair, MD;  Location: [facility] ENDOSCOPY;  Service: Cardiovascular;  Laterality: N/A;   CARDIOVERSION N/A 01/07/2018   Procedure: CARDIOVERSION;  Surgeon: Elease Hashimoto Deloris Ping, MD;  Location: [facility] ENDOSCOPY;  Service: Cardiovascular;  Laterality: N/A;   CARDIOVERSION N/A 12/10/2019   Procedure: CARDIOVERSION;  Surgeon: Jodelle Red, MD;  Location: [facility] ENDOSCOPY;  Service: Cardiovascular;  Laterality: N/A;   CARDIOVERSION N/A 03/09/2021   Procedure: CARDIOVERSION;  Surgeon: Chrystie Nose, MD;  Location: [facility] ENDOSCOPY;  Service: Cardiovascular;  Laterality:  N/A;   CARDIOVERSION N/A 10/04/2021   Procedure: CARDIOVERSION;  Surgeon: Jake Bathe, MD;  Location: [facility] ENDOSCOPY;  Service: Cardiovascular;  Laterality: N/A;   CHOLECYSTECTOMY     COLONOSCOPY W/ POLYPECTOMY     Dr Kinnie Scales   CYSTOSCOPY N/A 02/06/2015   Procedure: CYSTOSCOPY;  Surgeon: Ihor Gully, MD;  Location: [facility] ORS;  Service: Urology;  Laterality: N/A;   CYSTOSCOPY W/ RETROGRADES Left 11/17/2017   Procedure: CYSTOSCOPY WITH RETROGRADE /PYELOGRAM/;  Surgeon: Ihor Gully, MD;  Location: [facility]S;  Service: Urology;  Laterality: Left;   CYSTOSCOPY WITH RETROGRADE PYELOGRAM, URETEROSCOPY AND STENT PLACEMENT Right 02/06/2015   Procedure: RETROGRADE PYELOGRAM, RIGHT URETEROSCOPY STENT PLACEMENT;  Surgeon: Ihor Gully, MD;  Location: [facility];  Service: Urology;  Laterality: Right;   CYSTOSCOPY WITH RETROGRADE PYELOGRAM, URETEROSCOPY AND STENT PLACEMENT Right 03/07/2017   Procedure: CYSTOSCOPY WITH RIGHT RETROGRADE PYELOGRAM,RIGHT  URETEROSCOPYLASER LITHOTRIPSY  AND STENT PLACEMENT AND STONE BASKETRY;  Surgeon: Ihor Gully, MD;  Location: [facility];  Service: Urology;  Laterality: Right;   EYE SURGERY     cataract surgery   fatty mass removal  1999   pubic area   HOLMIUM LASER APPLICATION N/A 02/06/2015   Procedure: HOLMIUM LASER APPLICATION;  Surgeon: Ihor Gully, MD;  Location: [facility];  Service: Urology;  Laterality: N/A;   HOLMIUM LASER APPLICATION Right 03/07/2017   Procedure: HOLMIUM LASER APPLICATION;  Surgeon: Ihor Gully, MD;  Location: [facility];  Service: Urology;  Laterality: Right;   HOLMIUM LASER APPLICATION Left 11/17/2017   Procedure: HOLMIUM LASER APPLICATION;  Surgeon: Ihor Gully, MD;  Location: [facility];  Service: Urology;  Laterality: Left;   I & D EXTREMITY Left 12/19/2020   Procedure: IRRIGATION AND DEBRIDEMENT OF LEFT HIP HEMATOMA WITH APPLICATION OF WOUND VAC;  Surgeon: Terance Hart, MD;  Location: [facility] OR;  Service: Orthopedics;   Laterality: Left;   I & D EXTREMITY Left 02/06/2021   Procedure: IRRIGATION AND DEBRIDEMENT DEEP ABCESS LEFT THIGH, SECONDARY CLOSURE OF WOUND DEHISCENCE;  Surgeon: Terance Hart, MD;  Location: [facility] OR;  Service: Orthopedics;  Laterality: Left;   IR FLUORO GUIDE PORT INSERTION RIGHT  02/24/2017   IR NEPHROSTOMY PLACEMENT RIGHT  11/17/2017   IR PATIENT EVAL TECH 0-60 MINS  03/11/2017   IR REMOVAL TUN ACCESS W/ PORT W/O FL MOD SED  04/20/2018   IR US GUIDE VASC ACCESS RIGHT  02/24/2017   KNEE ARTHROSCOPY     bilateral   LAPAROSCOPIC GASTRIC BANDING  07/10/2010   LAPAROSCOPIC GASTRIC BANDING     Laparoscopic adjustable banding APS System with posterior hiatal hernia, 2 suture.  LAPAROTOMY     for ruptured ovary and ovarian artery    NEPHROLITHOTOMY Right 11/17/2017   Procedure: NEPHROLITHOTOMY PERCUTANEOUS;  Surgeon: Ihor Gully, MD;  Location: [facility];  Service: Urology;  Laterality: Right;   PACEMAKER INSERTION  03/10/2013   MDT dual chamber PPM   POCKET REVISION N/A 12/08/2013   Procedure: POCKET REVISION;  Surgeon: Duke Salvia, MD;  Location: [facility] CATH LAB;  Service: Cardiovascular;  Laterality: N/A;   PORTA CATH INSERTION     REVERSE SHOULDER ARTHROPLASTY Right 05/14/2018   Procedure: RIGHT REVERSE SHOULDER ARTHROPLASTY;  Surgeon: Jones Broom, MD;  Location: [facility] OR;  Service: Orthopedics;  Laterality: Right;   REVERSE SHOULDER REPLACEMENT Right 05/14/2018   RIGHT HEART CATH N/A 07/21/2019   Procedure: RIGHT HEART CATH;  Surgeon: Laurey Morale, MD;  Location: [facility] INVASIVE CV LAB;  Service: Cardiovascular;  Laterality: N/A;   TEE WITH CARDIOVERSION  09/22/2017   TEE WITHOUT CARDIOVERSION N/A 10/03/2017   Procedure: TRANSESOPHAGEAL ECHOCARDIOGRAM (TEE);  Surgeon: Thurmon Fair, MD;  Location: [facility] ENDOSCOPY;  Service: Cardiovascular;  Laterality: N/A;   TEE WITHOUT CARDIOVERSION N/A 08/04/2019   Procedure: TRANSESOPHAGEAL ECHOCARDIOGRAM (TEE);  Surgeon: Laurey Morale, MD;   Location: [facility] ENDOSCOPY;  Service: Cardiovascular;  Laterality: N/A;   TEE WITHOUT CARDIOVERSION N/A 12/10/2019   Procedure: TRANSESOPHAGEAL ECHOCARDIOGRAM (TEE);  Surgeon: Jodelle Red, MD;  Location: [facility] ENDOSCOPY;  Service: Cardiovascular;  Laterality: N/A;   TEE WITHOUT CARDIOVERSION N/A 03/09/2021   Procedure: TRANSESOPHAGEAL ECHOCARDIOGRAM (TEE);  Surgeon: Chrystie Nose, MD;  Location: [facility] ENDOSCOPY;  Service: Cardiovascular;  Laterality: N/A;   TEE WITHOUT CARDIOVERSION N/A 10/04/2021   Procedure: TRANSESOPHAGEAL ECHOCARDIOGRAM (TEE);  Surgeon: Jake Bathe, MD;  Location: [facility] ENDOSCOPY;  Service: Cardiovascular;  Laterality: N/A;   THYROIDECTOMY  1998   Dr Jamey Ripa   TONSILLECTOMY     TOTAL KNEE ARTHROPLASTY  04/13/2012   Procedure: TOTAL KNEE ARTHROPLASTY;  Surgeon: Raymon Mutton, MD;  Location: [facility] OR;  Service: Orthopedics;  Laterality: Right;   VIDEO BRONCHOSCOPY WITH ENDOBRONCHIAL ULTRASOUND N/A 02/07/2017   Procedure: VIDEO BRONCHOSCOPY WITH ENDOBRONCHIAL ULTRASOUND;  Surgeon: Chilton Greathouse, MD;  Location: [facility] OR;  Service: Pulmonary;  Laterality: N/A;    Vitals:   10/30/21 1251  BP: 134/80  Pulse: 70     Subjective Assessment - 10/30/21 1237     Subjective  Pt reports nothing is going well since she fell.  Pt reports she was bending down to put a towel on the floor prior to shower and fell between toilet and sink.  Pt called fire department to get her up.    Pertinent History h/o hyperlipidemia, atrial fibrillation with pacemaker on Eliquis, lymphoma, confusion, subcortical hemorrhage on the left with intraventricular extension    Pain Onset In the past 7 days                         OT Treatments/Exercises (OP) - 10/30/21 1551       ADLs   Financial Management Engaged in check balancing task with pt requiring more than reasonable amount of time to organize expenses sequentially.  Pt coming up with multiple issues about why it was difficult.  Pt  sent home with the worksheet and encouraged to attempt at home in a quite environment where she does not feel she is being watched by therapist.  Pt continues to demonstrate decreased awareness of impairments and how they impact her safety in home and community.  ADL Comments Pt reports having more support on a fairly regular basis over the last month or so - declining use of care management by [facility] that had previously been requested by this therapist.  Pt demonstrating good awareness of energy conservation strategies, reporting "I can't do too much in a row before I need a break"  Pt able to report AE to assist with homemaking tasks and even when out in the community (ie use of motorized cart in grocery store).  Pt reports using Rollator more in home and community.  Educated on use to increase independence and stability with ADLs and IADLs and to use it for energy conservation.                      OT Short Term Goals - 09/28/21 0950       OT SHORT TERM GOAL #1   Title Pt will verbalize understanding of 3 energy conservastion strategies to increase success with IADLs.    Time 3    Period Weeks    Status On-going    Target Date 10/17/21      OT SHORT TERM GOAL #2   Title Pt will be Independent in completing HEP for activity tolerance and generalized strengthening to increase independence with ADLs and IADLs.    Time 3    Period Weeks    Status On-going               OT Long Term Goals - 09/28/21 0950       OT LONG TERM GOAL #1   Title Pt will be independent in energy conservation strategies.    Time 6    Period Weeks    Status On-going    Target Date 11/07/21      OT LONG TERM GOAL #2   Title Pt will complete 10 min moderately challenging task with no rest break to demonstrate increased endurance as needed to engage in ADLs and IADLs.    Time 6    Period Weeks    Status On-going      OT LONG TERM GOAL #3   Title Pt will look in to independent/assisted living  or alternative housing to assist with ADL and IADL needs.    Time 6    Period Weeks    Status On-going                   Plan - 10/30/21 1240     Clinical Impression Statement Pt refusing assistance from [facility] [place] [facility] reporting that she does not need the assistance for socialization and that she is able to complete all of her household tasks adequately.  Pt continues to demonstrate decreased awareness of her impairments and is even declining to look into alternative housing, even to consider for the future.  Pt required more than reasonable amount of time to order finances sequentially during simulated financial task.  Pt with excuses for her difficulty, requesting to attempt at home on her own time.    OT Occupational Profile and History Detailed Assessment- Review of Records and additional review of physical, cognitive, psychosocial history related to current functional performance    Occupational performance deficits (Please refer to evaluation for details): ADL's;IADL's;Leisure;Social Participation    Body Structure / Function / Physical Skills ADL;Balance;Body mechanics;Cardiopulmonary status limiting activity;Coordination;Endurance;FMC;GMC;IADL;Mobility;Pain;ROM;Proprioception;Sensation;Strength;UE functional use;Vision    Psychosocial Skills Environmental  Adaptations;Routines and Behaviors    Rehab Potential Good    Clinical Decision Making Limited treatment  options, no task modification necessary    Comorbidities Affecting Occupational Performance: May have comorbidities impacting occupational performance    Modification or Assistance to Complete Evaluation  No modification of tasks or assist necessary to complete eval    OT Frequency 2x / week    OT Duration 6 weeks    OT Treatment/Interventions Self-care/ADL training;Cryotherapy;Electrical Stimulation;Ultrasound;Moist Heat;Fluidtherapy;Therapeutic exercise;Neuromuscular education;Energy conservation;Manual  Therapy;Functional Mobility Training;DME and/or AE instruction;Passive range of motion;Patient/family education;Visual/perceptual remediation/compensation;Cognitive remediation/compensation;Therapeutic activities;Balance training;Psychosocial skills training;Coping strategies training    Plan Dynamic balance/mobility while addressing balance, dual tasking, working memory, safety awareness, and activity tolerance for safety during ADL/IADLs - may need to d/c if pt continues to refuse additional services for home/community resources    Consulted and Agree with Plan of Care Patient             Patient will benefit from skilled therapeutic intervention in order to improve the following deficits and impairments:   Body Structure / Function / Physical Skills: ADL, Balance, Body mechanics, Cardiopulmonary status limiting activity, Coordination, Endurance, FMC, GMC, IADL, Mobility, Pain, ROM, Proprioception, Sensation, Strength, UE functional use, Vision   Psychosocial Skills: Environmental  Adaptations, Routines and Behaviors   Visit Diagnosis: Muscle weakness (generalized)  Unsteadiness on feet  Other abnormalities of gait and mobility  Attention and concentration deficit    Problem List Patient Active Problem List   Diagnosis Date Noted   Hypertension    Pressure injury of skin 03/15/2021   Intracranial hemorrhage ([place]) 03/14/2021   Lower extremity edema    Polymicrobial bacterial infection    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    Enterococcus faecalis infection    Sepsis due to Escherichia coli ([place])    Debility 02/13/2021   Infection of wound hematoma 02/05/2021   Physical debility 12/22/2020   Tear of left hamstring 12/22/2020   Labral tear of left hip joint 12/22/2020   Acute blood loss anemia    Orthostatic hypotension 12/17/2020   History of CVA (cerebrovascular accident) 12/17/2020   Fall 12/15/2020   Hip hematoma, left, initial encounter 12/15/2020    Nondisplaced fracture of coronoid process of left ulna, initial encounter for closed fracture 12/15/2020   Multiple rib fractures 12/15/2020   COVID-19 virus infection 10/12/2020   Constipation 06/22/2020   Change in bowel habits 06/22/2020   Diverticulosis 06/22/2020   Dark stools 06/22/2020   Hemorrhoids 06/22/2020   Pacemaker 01/23/2020   Junctional rhythm 01/23/2020   Palpitations 01/23/2020   ICH (intracerebral hemorrhage) ([place]) 12/02/2019   A-fib ([place]) 12/02/2019   Anemia 12/02/2019   QT prolongation 12/02/2019   Hypothyroidism 12/02/2019   Gait abnormality 07/10/2018   S/P reverse total shoulder arthroplasty, 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   Persistent atrial fibrillation ([place])    Bilateral ureteral calculi 11/17/2017   Atrial fibrillation with RVR ([place]) 09/15/2017   Atrial flutter ([place]) 07/17/2017   Port catheter in place 04/02/2017   Non-Hodgkin lymphoma, unspecified, intrathoracic lymph nodes ([place]) 02/13/2017   Mediastinal mass 02/06/2017   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   Abnormal chest x-ray    Lung mass 02/03/2017   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   OSA (obstructive sleep apnea) 02/03/2015   History of renal calculi 11/16/2014   Renal calculi 11/16/2014   Family history of colon cancer 10/26/2014   Mechanical complication due to cardiac pacemaker pulse generator 11/06/2013   Dyspnea 05/12/2013   Hypothyroidism 04/21/2013   Pleural effusion 04/19/2013   Long term (current) use of anticoagulants 12/20/2010   EPIDERMOID CYST 08/22/2010  Chronic diastolic heart failure ([place]) 08/06/2010   SYNCOPE AND COLLAPSE 07/27/2010   OSTEOARTHRITIS, KNEE, RIGHT 03/15/2010   Class 1 obesity due to excess calories with body mass index (BMI) of 33.0 to 33.9 in adult 12/07/2009   NONSPEC ELEVATION OF LEVELS OF TRANSAMINASE/LDH 09/08/2009   BREAST CANCER, HX OF 07/25/2009   COLONIC POLYPS, HX OF 07/25/2009   TUBULOVILLOUS ADENOMA, COLON 04/29/2008   HYPERGLYCEMIA,  FASTING 04/29/2008   HYPERLIPIDEMIA 06/22/2007   CARCINOMA, THYROID GLAND, HX OF 06/22/2007    Simonne Come, OT 10/30/2021, 3:58 PM  [facility] [address] [place], [place], [address] Phone: [phone]   Fax:  [phone]  Name: Kayla Harrison MRN: [id number] Date of Birth: [birth year]

## 2021-11-01 ENCOUNTER — Ambulatory Visit: Payer: Medicare Other | Admitting: Physical Therapy

## 2021-11-01 ENCOUNTER — Other Ambulatory Visit: Payer: Self-pay

## 2021-11-01 ENCOUNTER — Encounter: Payer: Self-pay | Admitting: Physical Therapy

## 2021-11-01 VITALS — BP 138/80 | HR 73

## 2021-11-01 DIAGNOSIS — M6281 Muscle weakness (generalized): Secondary | ICD-10-CM | POA: Diagnosis not present

## 2021-11-01 DIAGNOSIS — R2689 Other abnormalities of gait and mobility: Secondary | ICD-10-CM

## 2021-11-01 DIAGNOSIS — R2681 Unsteadiness on feet: Secondary | ICD-10-CM | POA: Diagnosis not present

## 2021-11-01 DIAGNOSIS — R41840 Attention and concentration deficit: Secondary | ICD-10-CM | POA: Diagnosis not present

## 2021-11-01 NOTE — Therapy (Signed)
[facility] [address], [place], [place], [address] Phone: [phone]   Fax:  [phone]  Physical Therapy Treatment  Patient Details  Name: Kayla Harrison MRN: [id number] Date of Birth: 07-08-1946 Referring Provider (PT): Garvin Fila, MD   Encounter Date: 11/01/2021   PT End of Session - 11/01/21 1213     Visit Number 18    Number of Visits 24    Date for PT Re-Evaluation 11/09/21    Authorization Type Medicare & MoO    PT Start Time 1127    PT Stop Time 1209    PT Time Calculation (min) 42 min    Equipment Utilized During Treatment Gait belt    Activity Tolerance Patient limited by fatigue    Behavior During Therapy WFL for tasks assessed/performed             Past Medical History:  Diagnosis Date   Anemia    Arthritis    osteoarthritis - knees and right shoulder   Blood transfusion without reported diagnosis    Breast cancer ([place])    Dr Margot Chimes, total thyroidectomy- 1999- for cancer   Brucellosis 1964   Chronic bilateral pleural effusions    Colon polyp    Dr Earlean Shawl   Complication of anesthesia    Ketamine produces LSD reaction, bright colored nightmarish experience    Dyslipidemia    Endometriosis    Fibroids    H/O pleural effusion    s/p thoracentesis w 3230m withdrawn   Hepatitis    [place], ?hepatitis    History of dysphagia    due to radiation therapy   History of hiatal hernia    small noted on PET scan   Hypertension    Hypothyroidism    Lung cancer, lower lobe ([place] 01/2017   radiation RX completed 03/04/17; will start chemo 6/27, pt unaware of lung cancer   Morbid obesity ([place]    Status post lap band surgery   Nephrolithiasis    Non Hodgkin's lymphoma ([place]    on chemotherapy   Persistent atrial fibrillation ([place]    a. s/p PVI 2008 b. s/p convergent ablation 20263complicated by bradycardia requiring pacemaker implant   Personal history of  radiation therapy    Presence of permanent cardiac pacemaker    Rotator cuff tear    Right   [place]   SVC syndrome    with lung mass and non hodgkins lymphoma   Thyroid cancer ([place] 2000    Past Surgical History:  Procedure Laterality Date   ABDOMINAL HYSTERECTOMY  1983   afib ablation     a. 2008 PVI b. 2014 convergent ablation   APPENDECTOMY     BONE MARROW BIOPSY  02/21/2017   BREAST LUMPECTOMY Left 2010   [place]    2015- negative   CARDIOVERSION  10/09/2012   Procedure: CARDIOVERSION;  Surgeon: JMinus Breeding MD;  [place]  Service: Cardiovascular;  Laterality: N/A;   CARDIOVERSION  10/09/2012   Procedure: CARDIOVERSION;  Surgeon: JMinus Breeding MD;  [facility]  Service: Cardiovascular;  Laterality: N/A;  [name] the ok to add pt to the add on , but we must check to find out if the can add pt on at 1400 [phone]   CARDIOVERSION N/A 11/20/2012   Procedure: CARDIOVERSION;  Surgeon: PFay Records MD;  Location: [place];  Service: Cardiovascular;  Laterality: N/A;   CARDIOVERSION N/A 07/18/2017   Procedure: CARDIOVERSION;  Surgeon: Acie Fredrickson Wonda Cheng, MD;  Location: [place];  Service: Cardiovascular;  Laterality: N/A;   CARDIOVERSION N/A 10/03/2017   Procedure: CARDIOVERSION;  Surgeon: Sanda Klein, MD;  Location: [place];  Service: Cardiovascular;  Laterality: N/A;   CARDIOVERSION N/A 01/07/2018   Procedure: CARDIOVERSION;  Surgeon: Thayer Headings, MD;  [facility];  Service: Cardiovascular;  Laterality: N/A;   CARDIOVERSION N/A 12/10/2019   Procedure: CARDIOVERSION;  Surgeon: Buford Dresser, MD;  Location: [facility] ENDOSCOPY;  Service: Cardiovascular;  Laterality: N/A;   CARDIOVERSION N/A 03/09/2021   Procedure: CARDIOVERSION;  Surgeon: Pixie Casino, MD;  Location: [place];  Service: Cardiovascular;  Laterality: N/A;   CARDIOVERSION N/A 10/04/2021   Procedure:  CARDIOVERSION;  Surgeon: Jerline Pain, MD;  Location: [place];  Service: Cardiovascular;  Laterality: N/A;   CHOLECYSTECTOMY     COLONOSCOPY W/ POLYPECTOMY     Dr Earlean Shawl   CYSTOSCOPY N/A 02/06/2015   Procedure: CYSTOSCOPY;  Surgeon: Kathie Rhodes, MD;  [facility];  Service: Urology;  Laterality: N/A;   CYSTOSCOPY W/ RETROGRADES Left 11/17/2017   Procedure: CYSTOSCOPY WITH RETROGRADE /PYELOGRAM/;  Surgeon: Kathie Rhodes, MD;  [facility];  Service: Urology;  Laterality: Left;   CYSTOSCOPY WITH RETROGRADE PYELOGRAM, URETEROSCOPY AND STENT PLACEMENT Right 02/06/2015   Procedure: RETROGRADE PYELOGRAM, RIGHT URETEROSCOPY STENT PLACEMENT;  Surgeon: Kathie Rhodes, MD;  [facility];  Service: Urology;  Laterality: Right;   CYSTOSCOPY WITH RETROGRADE PYELOGRAM, URETEROSCOPY AND STENT PLACEMENT Right 03/07/2017   Procedure: CYSTOSCOPY WITH RIGHT RETROGRADE PYELOGRAM,RIGHT  URETEROSCOPYLASER LITHOTRIPSY  AND STENT PLACEMENT AND STONE BASKETRY;  Surgeon: Kathie Rhodes, MD;  Location: [place];  Service: Urology;  Laterality: Right;   EYE SURGERY     cataract surgery   fatty mass removal  1999   pubic area   HOLMIUM LASER APPLICATION N/A 2/35/5732   Procedure: HOLMIUM LASER APPLICATION;  Surgeon: Kathie Rhodes, MD;  [facility];  Service: Urology;  Laterality: N/A;   HOLMIUM LASER APPLICATION Right 10/25/5425   Procedure: HOLMIUM LASER APPLICATION;  Surgeon: Kathie Rhodes, MD;  [facility];  Service: Urology;  Laterality: Right;   HOLMIUM LASER APPLICATION Left 0/62/3762   Procedure: HOLMIUM LASER APPLICATION;  Surgeon: Kathie Rhodes, MD;  [facility];  Service: Urology;  Laterality: Left;   I & D EXTREMITY Left 12/19/2020   Procedure: IRRIGATION AND DEBRIDEMENT OF LEFT HIP HEMATOMA WITH APPLICATION OF WOUND VAC;  Surgeon: Erle Crocker, MD;  Location: [place];  Service: Orthopedics;  Laterality: Left;   I & D EXTREMITY Left 02/06/2021    Procedure: IRRIGATION AND DEBRIDEMENT DEEP ABCESS LEFT THIGH, SECONDARY CLOSURE OF WOUND DEHISCENCE;  Surgeon: Erle Crocker, MD;  Location: [place];  Service: Orthopedics;  Laterality: Left;   IR FLUORO GUIDE PORT INSERTION RIGHT  02/24/2017   IR NEPHROSTOMY PLACEMENT RIGHT  11/17/2017   IR PATIENT EVAL TECH 0-60 MINS  03/11/2017   IR REMOVAL TUN ACCESS W/ PORT W/O FL MOD SED  04/20/2018   IR US GUIDE VASC ACCESS RIGHT  02/24/2017   KNEE ARTHROSCOPY     bilateral   LAPAROSCOPIC GASTRIC BANDING  07/10/2010   LAPAROSCOPIC GASTRIC BANDING     Laparoscopic adjustable banding APS System with posterior hiatal hernia, 2 suture.   LAPAROTOMY     for ruptured ovary and ovarian artery  NEPHROLITHOTOMY Right 11/17/2017   Procedure: NEPHROLITHOTOMY PERCUTANEOUS;  Surgeon: Kathie Rhodes, MD;  [facility];  Service: Urology;  Laterality: Right;   PACEMAKER INSERTION  03/10/2013   MDT dual chamber PPM   POCKET REVISION N/A 12/08/2013   Procedure: POCKET REVISION;  Surgeon: Deboraha Sprang, MD;  [facility];  Service: Cardiovascular;  Laterality: N/A;   PORTA CATH INSERTION     REVERSE SHOULDER ARTHROPLASTY Right 05/14/2018   Procedure: RIGHT REVERSE SHOULDER ARTHROPLASTY;  Surgeon: Tania Ade, MD;  Location: [place];  Service: Orthopedics;  Laterality: Right;   REVERSE SHOULDER REPLACEMENT Right 05/14/2018   RIGHT HEART CATH N/A 07/21/2019   Procedure: RIGHT HEART CATH;  Surgeon: Larey Dresser, MD;  Location: [place] CV LAB;  Service: Cardiovascular;  Laterality: N/A;   TEE WITH CARDIOVERSION  09/22/2017   TEE WITHOUT CARDIOVERSION N/A 10/03/2017   Procedure: TRANSESOPHAGEAL ECHOCARDIOGRAM (TEE);  Surgeon: Sanda Klein, MD;  Location: [facility] ENDOSCOPY;  Service: Cardiovascular;  Laterality: N/A;   TEE WITHOUT CARDIOVERSION N/A 08/04/2019   Procedure: TRANSESOPHAGEAL ECHOCARDIOGRAM (TEE);  Surgeon: Larey Dresser, MD;  Location: [facility] ENDOSCOPY;  Service: Cardiovascular;   Laterality: N/A;   TEE WITHOUT CARDIOVERSION N/A 12/10/2019   Procedure: TRANSESOPHAGEAL ECHOCARDIOGRAM (TEE);  Surgeon: Buford Dresser, MD;  Location: [facility] ENDOSCOPY;  Service: Cardiovascular;  Laterality: N/A;   TEE WITHOUT CARDIOVERSION N/A 03/09/2021   Procedure: TRANSESOPHAGEAL ECHOCARDIOGRAM (TEE);  Surgeon: Pixie Casino, MD;  Location: [facility] ENDOSCOPY;  Service: Cardiovascular;  Laterality: N/A;   TEE WITHOUT CARDIOVERSION N/A 10/04/2021   Procedure: TRANSESOPHAGEAL ECHOCARDIOGRAM (TEE);  Surgeon: Jerline Pain, MD;  Location: [facility] ENDOSCOPY;  Service: Cardiovascular;  Laterality: N/A;   THYROIDECTOMY  1998   Dr Margot Chimes   TONSILLECTOMY     TOTAL KNEE ARTHROPLASTY  04/13/2012   Procedure: TOTAL KNEE ARTHROPLASTY;  Surgeon: Rudean Haskell, MD;  Location: [place];  Service: Orthopedics;  Laterality: Right;   VIDEO BRONCHOSCOPY WITH ENDOBRONCHIAL ULTRASOUND N/A 02/07/2017   Procedure: VIDEO BRONCHOSCOPY WITH ENDOBRONCHIAL ULTRASOUND;  Surgeon: Marshell Garfinkel, MD;  Location: [place];  Service: Pulmonary;  Laterality: N/A;    Vitals:   11/01/21 1129  BP: 138/80  Pulse: 73  SpO2: 93%     Subjective Assessment - 11/01/21 1117     Subjective I'm having more and more trouble moving. I feel like I'm going backwards.    Pertinent History anemia, breast CA s/p surgery, lung CA s/p radiation and chemo, lymphoma on chemo, thyroid CA, HLD, hiatal hernia, HTN, a-fib, pacemaker, R reverse TSA 2019, stroke 2003, SVC syndrome, R TKA 2013    Diagnostic tests 03/15/21 head CT: Similar appearance of left caudothalamic hemorrhage with  intraventricular extension and mild adjacent edema. No new  hemorrhage. Unchanged mild trapping of the left lateral ventricle    Patient Stated Goals be able to get up off the floor    Currently in Pain? No/denies                               [facility] Adult PT Treatment/Exercise - 11/01/21 0001       Neuro Re-ed    Neuro Re-ed Details  sidestepping  and backwards walking along TM rail wiht light 1 UE support, alt toe tap on 4" step 3x10      Knee/Hip Exercises: Aerobic   Nustep Level 4 all 4 extremities x 6 minutes with steps per minute >75  Knee/Hip Exercises: Standing   Functional Squat 1 set;10 reps    Functional Squat Limitations at TM rail; patient not holding on for max challenge      Knee/Hip Exercises: Seated   Sit to Sand 5 reps;2 sets;with UE support   pushing off knees; 1st set elevated on airex; 2nd set from normal surface                    PT Education - 11/01/21 1212     Education Details discussion on plateau on progress with PT and need to F/U with PCP to address medical concerns and fatigue; update to HEP- Access Code: [email or web address]    Person(s) Educated Patient    Methods Explanation;Tactile cues;Demonstration;Verbal cues;Handout    Comprehension Verbalized understanding;Returned demonstration              PT Short Term Goals - 09/28/21 1050       PT SHORT TERM GOAL #1   Title Patient to be independent with initial HEP.    Time 3    Period Weeks    Status Achieved    Target Date 08/08/21               PT Long Term Goals - 09/28/21 1051       PT LONG TERM GOAL #1   Title Patient to be independent with advanced HEP.    Time 6    Period Weeks    Status On-going   limited compliance d/t medical concerns/issues   Target Date 11/09/21      PT LONG TERM GOAL #2   Title Patient to demonstrate B LE strength >/=4+/5.    Time 6    Period Weeks    Status Partially Met   weakness still remaining   Target Date 11/09/21      PT LONG TERM GOAL #3   Title Patient to score at least 46/56 on [place] in order to decrease risk of falls.    Time 6    Period Weeks    Status On-going   34/56   Target Date 11/09/21      PT LONG TERM GOAL #4   Title Patient to complete TUG in <14 sec with LRAD in order to decrease risk of falls.    Time 6    Period Weeks    Status On-going   18.7 sec with  [facility]   Target Date 11/09/21      PT LONG TERM GOAL #5   Title Patient to demonstrate 5xSTS test in <15 sec in order to decrease risk of falls.    Time 6    Period Weeks    Status On-going   requires increased time d/t attempting without UEs; 50 sec   Target Date 11/09/21      PT LONG TERM GOAL #6   Title Patient to demonstrate safe floor transfer with mod I.    Time 6    Period Weeks    Status Deferred   NT d/t safety   Target Date 11/09/21                   Plan - 11/01/21 1214     Clinical Impression Statement Patient arrived to session with report of feeling weak and having increased trouble with mobility. Vitals stable at start of session. Spoke to patient about current plateau in progress and recommended F/U with MD about her medical comorbidities to address continued weakness and fatigue, and plan to  wrap up with PT in coming sessions d/t lack of progress. Patient agreeable. Worked on standing balance and stepping activities per patients request, after inquiry on remaining difficulties at home. Patient particularly unsteady with backwards walking, requiring cueing to contract core. Able to perform STS with UE use on knees with good consistently from last session. SLS activities required light UE assist for stability. Intermittent sitting rest breaks required throughout session dt fatigue. Updated HEP and administered to patient- she reported understanding. No complaints at end of session.    Personal Factors and Comorbidities Age;Fitness;Comorbidity 3+;Transportation;Time since onset of injury/illness/exacerbation;Past/Current Experience    Comorbidities anemia, breast CA s/p surgery, lung CA s/p radiation and chemo, lymphoma on chemo, thyroid CA, HLD, hiatal hernia, HTN, a-fib, pacemaker, R reverse TSA 2019, stroke 2003, SVC syndrome, R TKA 2013    Examination-Activity Limitations Bathing;Locomotion Level;Transfers;Bed Mobility;Reach  Overhead;Bend;Carry;Squat;Dressing;Stairs;Stand;Hygiene/Grooming;Lift;Toileting    Examination-Participation Restrictions Laundry;Shop;Community Activity;Cleaning;Church;Meal Prep    Stability/Clinical Decision Making Stable/Uncomplicated    Rehab Potential Good    PT Frequency Other (comment)   1-2x   PT Duration 6 weeks    PT Treatment/Interventions ADLs/Self Care Home Management;Cryotherapy;Electrical Stimulation;DME Instruction;Moist Heat;Gait training;Stair training;Functional mobility training;Therapeutic activities;Therapeutic exercise;Balance training;Neuromuscular re-education;Manual techniques;Patient/family education;Passive range of motion;Dry needling;Energy conservation;Vestibular;Vasopneumatic Device;Taping    PT Next Visit Plan Continue balance training with SLS activities; Continue to progress BLE stretching and functional strengthening to improve posture and gait deviations; Encourage gait with RW when not in therapy sessions; D/C on 02/16 appointment    Consulted and Agree with Plan of Care Patient             Patient will benefit from skilled therapeutic intervention in order to improve the following deficits and impairments:  Abnormal gait, Decreased range of motion, Difficulty walking, Increased fascial restricitons, Increased muscle spasms, Decreased safety awareness, Decreased endurance, Cardiopulmonary status limiting activity, Decreased activity tolerance, Decreased balance, Impaired flexibility, Improper body mechanics, Postural dysfunction, Decreased strength  Visit Diagnosis: Muscle weakness (generalized)  Unsteadiness on feet  Other abnormalities of gait and mobility     Problem List Patient Active Problem List   Diagnosis Date Noted   Hypertension    Pressure injury of skin 03/15/2021   Intracranial hemorrhage ([place]) 03/14/2021   Lower extremity edema    Polymicrobial bacterial infection    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    Enterococcus  faecalis infection    Sepsis due to Escherichia coli ([place])    Debility 02/13/2021   Infection of wound hematoma 02/05/2021   Physical debility 12/22/2020   Tear of left hamstring 12/22/2020   Labral tear of left hip joint 12/22/2020   Acute blood loss anemia    Orthostatic hypotension 12/17/2020   History of CVA (cerebrovascular accident) 12/17/2020   Fall 12/15/2020   Hip hematoma, left, initial encounter 12/15/2020   Nondisplaced fracture of coronoid process of left ulna, initial encounter for closed fracture 12/15/2020   Multiple rib fractures 12/15/2020   COVID-19 virus infection 10/12/2020   Constipation 06/22/2020   Change in bowel habits 06/22/2020   Diverticulosis 06/22/2020   Dark stools 06/22/2020   Hemorrhoids 06/22/2020   Pacemaker 01/23/2020   Junctional rhythm 01/23/2020   Palpitations 01/23/2020   ICH (intracerebral hemorrhage) ([place]) 12/02/2019   A-fib ([place]) 12/02/2019   Anemia 12/02/2019   QT prolongation 12/02/2019   Hypothyroidism 12/02/2019   Gait abnormality 07/10/2018   S/P reverse total shoulder arthroplasty, 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   Persistent atrial fibrillation ([place])    Bilateral ureteral calculi 11/17/2017   Atrial  fibrillation with RVR ([place]) 09/15/2017   Atrial flutter ([place]) 07/17/2017   Port catheter in place 04/02/2017   Non-Hodgkin lymphoma, unspecified, intrathoracic lymph nodes ([place]) 02/13/2017   Mediastinal mass 02/06/2017   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   Abnormal chest x-ray    Lung mass 02/03/2017   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   OSA (obstructive sleep apnea) 02/03/2015   History of renal calculi 11/16/2014   Renal calculi 11/16/2014   Family history of colon cancer 10/26/2014   Mechanical complication due to cardiac pacemaker pulse generator 11/06/2013   Dyspnea 05/12/2013   Hypothyroidism 04/21/2013   Pleural effusion 04/19/2013   Long term (current) use of anticoagulants 12/20/2010   EPIDERMOID CYST  08/22/2010   Chronic diastolic heart failure ([place]) 08/06/2010   SYNCOPE AND COLLAPSE 07/27/2010   OSTEOARTHRITIS, KNEE, RIGHT 03/15/2010   Class 1 obesity due to excess calories with body mass index (BMI) of 33.0 to 33.9 in adult 12/07/2009   NONSPEC ELEVATION OF LEVELS OF TRANSAMINASE/LDH 09/08/2009   BREAST CANCER, HX OF 07/25/2009   COLONIC POLYPS, HX OF 07/25/2009   TUBULOVILLOUS ADENOMA, COLON 04/29/2008   HYPERGLYCEMIA, FASTING 04/29/2008   HYPERLIPIDEMIA 06/22/2007   CARCINOMA, THYROID GLAND, HX OF 06/22/2007    Janene Harvey, PT, DPT 11/01/21 12:20 PM   [facility] [address] [place], [place], [address] Phone: [phone]   Fax:  [phone]  Name: Kayla Harrison MRN: [id number] Date of Birth: [birth year]

## 2021-11-06 ENCOUNTER — Encounter: Payer: Self-pay | Admitting: Physical Therapy

## 2021-11-06 ENCOUNTER — Ambulatory Visit: Payer: Medicare Other | Admitting: Physical Therapy

## 2021-11-06 ENCOUNTER — Encounter: Payer: Self-pay | Admitting: Occupational Therapy

## 2021-11-06 ENCOUNTER — Ambulatory Visit: Payer: Medicare Other | Admitting: Occupational Therapy

## 2021-11-06 ENCOUNTER — Other Ambulatory Visit: Payer: Self-pay

## 2021-11-06 VITALS — BP 135/74 | HR 71

## 2021-11-06 DIAGNOSIS — R2689 Other abnormalities of gait and mobility: Secondary | ICD-10-CM

## 2021-11-06 DIAGNOSIS — R41840 Attention and concentration deficit: Secondary | ICD-10-CM

## 2021-11-06 DIAGNOSIS — M6281 Muscle weakness (generalized): Secondary | ICD-10-CM | POA: Diagnosis not present

## 2021-11-06 DIAGNOSIS — R2681 Unsteadiness on feet: Secondary | ICD-10-CM

## 2021-11-06 DIAGNOSIS — Z23 Encounter for immunization: Secondary | ICD-10-CM | POA: Diagnosis not present

## 2021-11-06 NOTE — Therapy (Signed)
[facility] [address], [place], [place], [address] Phone: [phone]   Fax:  [phone]  Occupational Therapy Treatment  Patient Details  Name: Kayla Harrison MRN: [id number] Date of Birth: 07/22/1946 Referring Provider (OT): [place]  Visits from Start of Care: 17  Current functional level related to goals / functional outcomes: Pt has met 2 of 3 LTGs. Pt continues to be limited by endurance and multiple medical issues impacting her attendance and participation in therapy sessions.  Pt is able to complete functional mobility with Rollator, ADLs, and IADLs at Mod I level in home as pt lives alone.  Pt reports decreased endurance and needing to nap 1x/day and sometimes 2x due to decreased endurance.  Pt reports use of energy conservation strategies and modifications to IADLs as per education during therapy sessions.  Pt is not interested in pursuing or even investigating alternative housing options such as ILF or ALF and is not interested in resources from [facility] care management at this time.   Remaining deficits: Pt continues to demonstrate decreased awareness of deficits, especially in regards to cognition and working memory.  Pt demonstrates decreased endurance and activity tolerance.   Education / Equipment: Pt has been educated on energy conservation strategies, has HEP for dynamic balance and endurance with weights and resistance, and HEP for [facility] and strengthening of UE. Pt has been educated on community resources and has been contacted by [facility] care manager  Patient agrees to discharge. Patient goals were met. Patient is being discharged due to maximized rehab potential. .     Encounter Date: 11/06/2021   OT End of Session - 11/06/21 1038     Visit Number 17    Number of Visits 25    Date for OT Re-Evaluation 11/07/21    Authorization Type Medicare A and B    Authorization - Visit  Number 7    Authorization - Number of Visits 10    Progress Note Due on Visit 10    OT Start Time 1020    OT Stop Time 1100    OT Time Calculation (min) 40 min    Activity Tolerance Patient limited by fatigue    Behavior During Therapy WFL for tasks assessed/performed             Past Medical History:  Diagnosis Date   Anemia    Arthritis    osteoarthritis - knees and right shoulder   Blood transfusion without reported diagnosis    Breast cancer ([place])    Dr Margot Chimes, total thyroidectomy- 1999- for cancer   Brucellosis 1964   Chronic bilateral pleural effusions    Colon polyp    Dr Earlean Shawl   Complication of anesthesia    Ketamine produces LSD reaction, bright colored nightmarish experience    Dyslipidemia    Endometriosis    Fibroids    H/O pleural effusion    s/p thoracentesis w 3260m withdrawn   Hepatitis    [place] ?hepatitis    History of dysphagia    due to radiation therapy   History of hiatal hernia    small noted on PET scan   Hypertension    Hypothyroidism    [place]   radiation RX completed 03/04/17; will start chemo 6/27, pt unaware of lung cancer   Morbid obesity ([place]    Status post lap band surgery  Nephrolithiasis    Non Hodgkin's lymphoma ([place])    on chemotherapy   Persistent atrial fibrillation ([place])    a. s/p PVI 2008 b. s/p convergent ablation 7209 complicated by bradycardia requiring pacemaker implant   Personal history of radiation therapy    Presence of permanent cardiac pacemaker    Rotator cuff tear    Right   Stroke ([place])    2003- [place] x2   SVC syndrome    with lung mass and non hodgkins lymphoma   Thyroid cancer ([place]) 2000    Past Surgical History:  Procedure Laterality Date   ABDOMINAL HYSTERECTOMY  1983   afib ablation     a. 2008 PVI b. 2014 convergent ablation   APPENDECTOMY     BONE MARROW BIOPSY  02/21/2017   BREAST LUMPECTOMY Left 2010   [place]     2015- negative   CARDIOVERSION  10/09/2012   Procedure: CARDIOVERSION;  Surgeon: Minus Breeding, MD;  Location: [place];  Service: Cardiovascular;  Laterality: N/A;   CARDIOVERSION  10/09/2012   Procedure: CARDIOVERSION;  Surgeon: Minus Breeding, MD;  Location: [facility] ENDOSCOPY;  Service: Cardiovascular;  Laterality: N/A;  [name] gave the ok to add pt to the add on , but we must check to find out if the can add pt on at [phone])   CARDIOVERSION N/A 11/20/2012   Procedure: CARDIOVERSION;  Surgeon: Fay Records, MD;  Location: [place];  Service: Cardiovascular;  Laterality: N/A;   CARDIOVERSION N/A 07/18/2017   Procedure: CARDIOVERSION;  Surgeon: Thayer Headings, MD;  Location: [place];  Service: Cardiovascular;  Laterality: N/A;   CARDIOVERSION N/A 10/03/2017   Procedure: CARDIOVERSION;  Surgeon: Sanda Klein, MD;  Location: [place];  Service: Cardiovascular;  Laterality: N/A;   CARDIOVERSION N/A 01/07/2018   Procedure: CARDIOVERSION;  Surgeon: Nahser, Wonda Cheng, MD;  Location: [place];  Service: Cardiovascular;  Laterality: N/A;   CARDIOVERSION N/A 12/10/2019   Procedure: CARDIOVERSION;  Surgeon: Buford Dresser, MD;  Location: [place];  Service: Cardiovascular;  Laterality: N/A;   CARDIOVERSION N/A 03/09/2021   Procedure: CARDIOVERSION;  Surgeon: Pixie Casino, MD;  Location: [place];  Service: Cardiovascular;  Laterality: N/A;   CARDIOVERSION N/A 10/04/2021   Procedure: CARDIOVERSION;  Surgeon: Jerline Pain, MD;  Location: [place];  Service: Cardiovascular;  Laterality: N/A;   CHOLECYSTECTOMY     COLONOSCOPY W/ POLYPECTOMY     Dr Earlean Shawl   CYSTOSCOPY N/A 02/06/2015   Procedure: CYSTOSCOPY;  Surgeon: Kathie Rhodes, MD;  [facility];  Service: Urology;  Laterality: N/A;   CYSTOSCOPY W/ RETROGRADES Left 11/17/2017   Procedure: CYSTOSCOPY WITH RETROGRADE /PYELOGRAM/;  Surgeon: Kathie Rhodes, MD;  [facility];   Service: Urology;  Laterality: Left;   CYSTOSCOPY WITH RETROGRADE PYELOGRAM, URETEROSCOPY AND STENT PLACEMENT Right 02/06/2015   Procedure: RETROGRADE PYELOGRAM, RIGHT URETEROSCOPY STENT PLACEMENT;  Surgeon: Kathie Rhodes, MD;  [facility];  Service: Urology;  Laterality: Right;   CYSTOSCOPY WITH RETROGRADE PYELOGRAM, URETEROSCOPY AND STENT PLACEMENT Right 03/07/2017   Procedure: CYSTOSCOPY WITH RIGHT RETROGRADE PYELOGRAM,RIGHT  URETEROSCOPYLASER LITHOTRIPSY  AND STENT PLACEMENT AND STONE BASKETRY;  Surgeon: Kathie Rhodes, MD;  Location: [place];  Service: Urology;  Laterality: Right;   EYE SURGERY     cataract surgery   fatty mass removal  1999   pubic area   HOLMIUM LASER APPLICATION N/A 2/83/6629   Procedure: HOLMIUM LASER APPLICATION;  Surgeon: Kathie Rhodes, MD;  [facility];  Service: Urology;  Laterality: N/A;   HOLMIUM LASER APPLICATION Right 3/81/8299   Procedure: HOLMIUM LASER APPLICATION;  Surgeon: Kathie Rhodes, MD;  Location: [facility];  Service: Urology;  Laterality: Right;   HOLMIUM LASER APPLICATION Left 3/71/6967   Procedure: HOLMIUM LASER APPLICATION;  Surgeon: Kathie Rhodes, MD;  [facility];  Service: Urology;  Laterality: Left;   I & D EXTREMITY Left 12/19/2020   Procedure: IRRIGATION AND DEBRIDEMENT OF LEFT HIP HEMATOMA WITH APPLICATION OF WOUND VAC;  Surgeon: Erle Crocker, MD;  Location: [place];  Service: Orthopedics;  Laterality: Left;   I & D EXTREMITY Left 02/06/2021   Procedure: IRRIGATION AND DEBRIDEMENT DEEP ABCESS LEFT THIGH, SECONDARY CLOSURE OF WOUND DEHISCENCE;  Surgeon: Erle Crocker, MD;  Location: [place];  Service: Orthopedics;  Laterality: Left;   IR FLUORO GUIDE PORT INSERTION RIGHT  02/24/2017   IR NEPHROSTOMY PLACEMENT RIGHT  11/17/2017   IR PATIENT EVAL TECH 0-60 MINS  03/11/2017   IR REMOVAL TUN ACCESS W/ PORT W/O FL MOD SED  04/20/2018   IR US GUIDE VASC ACCESS RIGHT  02/24/2017   KNEE ARTHROSCOPY      bilateral   LAPAROSCOPIC GASTRIC BANDING  07/10/2010   LAPAROSCOPIC GASTRIC BANDING     Laparoscopic adjustable banding APS System with posterior hiatal hernia, 2 suture.   LAPAROTOMY     for ruptured ovary and ovarian artery    NEPHROLITHOTOMY Right 11/17/2017   Procedure: NEPHROLITHOTOMY PERCUTANEOUS;  Surgeon: Kathie Rhodes, MD;  [facility];  Service: Urology;  Laterality: Right;   PACEMAKER INSERTION  03/10/2013   MDT dual chamber PPM   POCKET REVISION N/A 12/08/2013   Procedure: POCKET REVISION;  Surgeon: Deboraha Sprang, MD;  Location: [facility] CATH LAB;  Service: Cardiovascular;  Laterality: N/A;   PORTA CATH INSERTION     REVERSE SHOULDER ARTHROPLASTY Right 05/14/2018   Procedure: RIGHT REVERSE SHOULDER ARTHROPLASTY;  Surgeon: Tania Ade, MD;  Location: [place];  Service: Orthopedics;  Laterality: Right;   REVERSE SHOULDER REPLACEMENT Right 05/14/2018   RIGHT HEART CATH N/A 07/21/2019   Procedure: RIGHT HEART CATH;  Surgeon: Larey Dresser, MD;  Location: [place] CV LAB;  Service: Cardiovascular;  Laterality: N/A;   TEE WITH CARDIOVERSION  09/22/2017   TEE WITHOUT CARDIOVERSION N/A 10/03/2017   Procedure: TRANSESOPHAGEAL ECHOCARDIOGRAM (TEE);  Surgeon: Sanda Klein, MD;  Location: [facility] ENDOSCOPY;  Service: Cardiovascular;  Laterality: N/A;   TEE WITHOUT CARDIOVERSION N/A 08/04/2019   Procedure: TRANSESOPHAGEAL ECHOCARDIOGRAM (TEE);  Surgeon: Larey Dresser, MD;  Location: [facility] ENDOSCOPY;  Service: Cardiovascular;  Laterality: N/A;   TEE WITHOUT CARDIOVERSION N/A 12/10/2019   Procedure: TRANSESOPHAGEAL ECHOCARDIOGRAM (TEE);  Surgeon: Buford Dresser, MD;  Location: [facility] ENDOSCOPY;  Service: Cardiovascular;  Laterality: N/A;   TEE WITHOUT CARDIOVERSION N/A 03/09/2021   Procedure: TRANSESOPHAGEAL ECHOCARDIOGRAM (TEE);  Surgeon: Pixie Casino, MD;  Location: [facility] ENDOSCOPY;  Service: Cardiovascular;  Laterality: N/A;   TEE WITHOUT CARDIOVERSION N/A 10/04/2021   Procedure:  TRANSESOPHAGEAL ECHOCARDIOGRAM (TEE);  Surgeon: Jerline Pain, MD;  Location: [facility] ENDOSCOPY;  Service: Cardiovascular;  Laterality: N/A;   THYROIDECTOMY  1998   Dr Margot Chimes   TONSILLECTOMY     TOTAL KNEE ARTHROPLASTY  04/13/2012   Procedure: TOTAL KNEE ARTHROPLASTY;  Surgeon: Rudean Haskell, MD;  Location: [place];  Service: Orthopedics;  Laterality: Right;   VIDEO BRONCHOSCOPY WITH ENDOBRONCHIAL ULTRASOUND N/A 02/07/2017   Procedure: VIDEO BRONCHOSCOPY WITH ENDOBRONCHIAL ULTRASOUND;  Surgeon:  Marshell Garfinkel, MD;  Location: [place] [facility];  Service: Pulmonary;  Laterality: N/A;    There were no vitals filed for this visit.   Subjective Assessment - 11/06/21 1023     Subjective  Pt reports check activity from previous session was quite challenging, stating that she could "not find the pattern" to complete it.  Pt reports she feels that she still "gives out" so quickly during exercises or activities.    Pertinent History h/o hyperlipidemia, atrial fibrillation with pacemaker on Eliquis, lymphoma, confusion, subcortical hemorrhage on the left with intraventricular extension    Currently in Pain? No/denies    Pain Onset In the past 7 days              ADL: Lengthy discussion about current status, prognosis, and recommendation to follow up with medical providers.  Focus to be placed on medical status as pt in a cycle of health and sickness with decreased endurance and tolerance for therapy and carryover.  Educated again on utilizing [facility] care management to assist with community resources and socialization and pt reports feeling isolated from her family and friends.  Encouraged continued engagement in HEP with weighted ball, theraband, and balance activities as well as [facility] and strengthening with putty.  Therapist provided pt with red theraband as she reports yellow band getting too easy.                        OT Short Term Goals - 11/06/21 1039       OT SHORT TERM GOAL #1   Title Pt  will verbalize understanding of 3 energy conservastion strategies to increase success with IADLs.    Time 3    Period Weeks    Status Achieved    Target Date 10/17/21      OT SHORT TERM GOAL #2   Title Pt will be Independent in completing HEP for activity tolerance and generalized strengthening to increase independence with ADLs and IADLs.    Time 3    Period Weeks    Status Achieved               OT Long Term Goals - 11/06/21 1039       OT LONG TERM GOAL #1   Title Pt will be independent in energy conservation strategies.    Time 6    Period Weeks    Status Achieved    Target Date 11/07/21      OT LONG TERM GOAL #2   Title Pt will complete 10 min moderately challenging task with no rest break to demonstrate increased endurance as needed to engage in ADLs and IADLs.    Time 6    Period Weeks    Status Achieved      OT LONG TERM GOAL #3   Title Pt will look in to independent/assisted living or alternative housing to assist with ADL and IADL needs.    Time 6    Period Weeks    Status Deferred                   Plan - 11/07/21 1522     Clinical Impression Statement Pt continues to demonstrate decreased awareness of her impairments and is even declining resources from [facility] care management and to look into alternative housing, even to consider for the future.  Pt continues to provide excuses for her difficulty with increased cogntive challenge with household tasks of money management/check balancing task.  Pt  reports lack of energy to complete tasks and frequency of napping 1x/day sometimes 2x.  Pt receptive to encouragement to focus on medical issues first as pt continues to fluctuate from a medical standpoint, impacting her attendance and her engagement in therapy sessions.  Therapist reiterated encouragement to utilize [facility] care management for community resources and socialization as pt reports still having moments of feeling isolated with decreased interaction with  family and friends.    OT Occupational Profile and History Detailed Assessment- Review of Records and additional review of physical, cognitive, psychosocial history related to current functional performance    Occupational performance deficits (Please refer to evaluation for details): ADL's;IADL's;Leisure;Social Participation    Body Structure / Function / Physical Skills ADL;Balance;Body mechanics;Cardiopulmonary status limiting activity;Coordination;Endurance;FMC;GMC;IADL;Mobility;Pain;ROM;Proprioception;Sensation;Strength;UE functional use;Vision    Psychosocial Skills Environmental  Adaptations;Routines and Behaviors    Rehab Potential Good    Clinical Decision Making Limited treatment options, no task modification necessary    Comorbidities Affecting Occupational Performance: May have comorbidities impacting occupational performance    Modification or Assistance to Complete Evaluation  No modification of tasks or assist necessary to complete eval    OT Frequency 2x / week    OT Duration 6 weeks    OT Treatment/Interventions Self-care/ADL training;Cryotherapy;Electrical Stimulation;Ultrasound;Moist Heat;Fluidtherapy;Therapeutic exercise;Neuromuscular education;Energy conservation;Manual Therapy;Functional Mobility Training;DME and/or AE instruction;Passive range of motion;Patient/family education;Visual/perceptual remediation/compensation;Cognitive remediation/compensation;Therapeutic activities;Balance training;Psychosocial skills training;Coping strategies training    Plan d/c to allow pt to focus on medical management and work with [facility] care management team.  Continue to utilize HEP for gross and fine motor control.    Consulted and Agree with Plan of Care Patient             Patient will benefit from skilled therapeutic intervention in order to improve the following deficits and impairments:   Body Structure / Function / Physical Skills: ADL, Balance, Body mechanics, Cardiopulmonary  status limiting activity, Coordination, Endurance, FMC, GMC, IADL, Mobility, Pain, ROM, Proprioception, Sensation, Strength, UE functional use, Vision   Psychosocial Skills: Environmental  Adaptations, Routines and Behaviors   Visit Diagnosis: Muscle weakness (generalized)  Unsteadiness on feet  Other abnormalities of gait and mobility  Attention and concentration deficit    Problem List Patient Active Problem List   Diagnosis Date Noted   Hypertension    Pressure injury of skin 03/15/2021   Intracranial hemorrhage ([place]) 03/14/2021   Lower extremity edema    Polymicrobial bacterial infection    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    Enterococcus faecalis infection    Sepsis due to Escherichia coli ([place])    Debility 02/13/2021   Infection of wound hematoma 02/05/2021   Physical debility 12/22/2020   Tear of left hamstring 12/22/2020   Labral tear of left hip joint 12/22/2020   Acute blood loss anemia    Orthostatic hypotension 12/17/2020   History of CVA (cerebrovascular accident) 12/17/2020   Fall 12/15/2020   Hip hematoma, left, initial encounter 12/15/2020   Nondisplaced fracture of coronoid process of left ulna, initial encounter for closed fracture 12/15/2020   Multiple rib fractures 12/15/2020   COVID-19 virus infection 10/12/2020   Constipation 06/22/2020   Change in bowel habits 06/22/2020   Diverticulosis 06/22/2020   Dark stools 06/22/2020   Hemorrhoids 06/22/2020   Pacemaker 01/23/2020   Junctional rhythm 01/23/2020   Palpitations 01/23/2020   ICH (intracerebral hemorrhage) ([place]) 12/02/2019   A-fib ([place]) 12/02/2019   Anemia 12/02/2019   QT prolongation 12/02/2019   Hypothyroidism 12/02/2019   Gait abnormality 07/10/2018  S/P reverse total shoulder arthroplasty, 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   Persistent atrial fibrillation ([place])    Bilateral ureteral calculi 11/17/2017   Atrial fibrillation with RVR ([place]) 09/15/2017   Atrial flutter ([place]) 07/17/2017   Port  catheter in place 04/02/2017   Non-Hodgkin lymphoma, unspecified, intrathoracic lymph nodes ([place]) 02/13/2017   Mediastinal mass 02/06/2017   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   Abnormal chest x-ray    Lung mass 02/03/2017   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   OSA (obstructive sleep apnea) 02/03/2015   History of renal calculi 11/16/2014   Renal calculi 11/16/2014   Family history of colon cancer 10/26/2014   Mechanical complication due to cardiac pacemaker pulse generator 11/06/2013   Dyspnea 05/12/2013   Hypothyroidism 04/21/2013   Pleural effusion 04/19/2013   Long term (current) use of anticoagulants 12/20/2010   EPIDERMOID CYST 08/22/2010   Chronic diastolic heart failure ([place]) 08/06/2010   SYNCOPE AND COLLAPSE 07/27/2010   OSTEOARTHRITIS, KNEE, RIGHT 03/15/2010   Class 1 obesity due to excess calories with body mass index (BMI) of 33.0 to 33.9 in adult 12/07/2009   NONSPEC ELEVATION OF LEVELS OF TRANSAMINASE/LDH 09/08/2009   BREAST CANCER, HX OF 07/25/2009   COLONIC POLYPS, HX OF 07/25/2009   TUBULOVILLOUS ADENOMA, COLON 04/29/2008   HYPERGLYCEMIA, FASTING 04/29/2008   HYPERLIPIDEMIA 06/22/2007   CARCINOMA, THYROID GLAND, HX OF 06/22/2007    Simonne Come, OT 11/07/2021, 3:54 PM  [place] [facility] [facility] [address], [place], [place], [address] Phone: [phone]   Fax:  [phone]  Name: Kayla Harrison MRN: [id number] Date of Birth: 10/05/1945

## 2021-11-06 NOTE — Therapy (Signed)
[facility] [address], [place], [place], [address] Phone: [phone]   Fax:  [phone]  Physical Therapy Treatment  Patient Details  Name: Kayla Harrison MRN: [id number] Date of Birth: 11/28/1945 Referring Provider (PT): Garvin Fila, MD   Encounter Date: 11/06/2021   PT End of Session - 11/06/21 1226     Visit Number 19    Number of Visits 24    Date for PT Re-Evaluation 11/09/21    Authorization Type Medicare & MoO    PT Start Time 1102    PT Stop Time 1143    PT Time Calculation (min) 41 min    Equipment Utilized During Treatment Gait belt    Activity Tolerance Patient limited by fatigue    Behavior During Therapy WFL for tasks assessed/performed             Past Medical History:  Diagnosis Date   Anemia    Arthritis    osteoarthritis - knees and right shoulder   Blood transfusion without reported diagnosis    Breast cancer ([place])    Dr Margot Chimes, total thyroidectomy- 1999- for cancer   Brucellosis 1964   Chronic bilateral pleural effusions    Colon polyp    Dr Earlean Shawl   Complication of anesthesia    Ketamine produces LSD reaction, bright colored nightmarish experience    Dyslipidemia    Endometriosis    Fibroids    H/O pleural effusion    s/p thoracentesis w 3236ml withdrawn   Hepatitis    [place], ?hepatitis    History of dysphagia    due to radiation therapy   History of hiatal hernia    small noted on PET scan   Hypertension    Hypothyroidism    Lung cancer, lower lobe ([place]) 01/2017   radiation RX completed 03/04/17; will start chemo 6/27, pt unaware of lung cancer   Morbid obesity ([place])    Status post lap band surgery   Nephrolithiasis    Non Hodgkin's lymphoma ([place])    on chemotherapy   Persistent atrial fibrillation ([place])    a. s/p PVI 2008 b. s/p convergent ablation 7209 complicated by bradycardia requiring pacemaker implant   Personal history of  radiation therapy    Presence of permanent cardiac pacemaker    Rotator cuff tear    Right   Stroke ([place])    2003- [place] x2   SVC syndrome    with lung mass and non hodgkins lymphoma   Thyroid cancer ([place]) 2000    Past Surgical History:  Procedure Laterality Date   ABDOMINAL HYSTERECTOMY  1983   afib ablation     a. 2008 PVI b. 2014 convergent ablation   APPENDECTOMY     BONE MARROW BIOPSY  02/21/2017   BREAST LUMPECTOMY Left 2010   [place]     2015- negative   CARDIOVERSION  10/09/2012   Procedure: CARDIOVERSION;  Surgeon: Minus Breeding, MD;  Location: [place];  Service: Cardiovascular;  Laterality: N/A;   CARDIOVERSION  10/09/2012   Procedure: CARDIOVERSION;  Surgeon: Minus Breeding, MD;  Location: [facility] ENDOSCOPY;  Service: Cardiovascular;  Laterality: N/A;  [name] gave the ok to add pt to the add on , but we must check to find out if the can add pt on at [phone])   CARDIOVERSION N/A 11/20/2012   Procedure: CARDIOVERSION;  Surgeon: Fay Records, MD;  Location: [place];  Service: Cardiovascular;  Laterality: N/A;   CARDIOVERSION N/A 07/18/2017   Procedure: CARDIOVERSION;  Surgeon: Acie Fredrickson Wonda Cheng, MD;  Location: [place];  Service: Cardiovascular;  Laterality: N/A;   CARDIOVERSION N/A 10/03/2017   Procedure: CARDIOVERSION;  Surgeon: Sanda Klein, MD;  Location: [place];  Service: Cardiovascular;  Laterality: N/A;   CARDIOVERSION N/A 01/07/2018   Procedure: CARDIOVERSION;  Surgeon: Thayer Headings, MD;  Location: [facility] ENDOSCOPY;  Service: Cardiovascular;  Laterality: N/A;   CARDIOVERSION N/A 12/10/2019   Procedure: CARDIOVERSION;  Surgeon: Buford Dresser, MD;  Location: [facility] ENDOSCOPY;  Service: Cardiovascular;  Laterality: N/A;   CARDIOVERSION N/A 03/09/2021   Procedure: CARDIOVERSION;  Surgeon: Pixie Casino, MD;  Location: [place];  Service: Cardiovascular;  Laterality: N/A;   CARDIOVERSION N/A 10/04/2021   Procedure:  CARDIOVERSION;  Surgeon: Jerline Pain, MD;  Location: [place];  Service: Cardiovascular;  Laterality: N/A;   CHOLECYSTECTOMY     COLONOSCOPY W/ POLYPECTOMY     Dr Earlean Shawl   CYSTOSCOPY N/A 02/06/2015   Procedure: CYSTOSCOPY;  Surgeon: Kathie Rhodes, MD;  Location: [facility];  Service: Urology;  Laterality: N/A;   CYSTOSCOPY W/ RETROGRADES Left 11/17/2017   Procedure: CYSTOSCOPY WITH RETROGRADE /PYELOGRAM/;  Surgeon: Kathie Rhodes, MD;  Location: [facility];  Service: Urology;  Laterality: Left;   CYSTOSCOPY WITH RETROGRADE PYELOGRAM, URETEROSCOPY AND STENT PLACEMENT Right 02/06/2015   Procedure: RETROGRADE PYELOGRAM, RIGHT URETEROSCOPY STENT PLACEMENT;  Surgeon: Kathie Rhodes, MD;  Location: [facility];  Service: Urology;  Laterality: Right;   CYSTOSCOPY WITH RETROGRADE PYELOGRAM, URETEROSCOPY AND STENT PLACEMENT Right 03/07/2017   Procedure: CYSTOSCOPY WITH RIGHT RETROGRADE PYELOGRAM,RIGHT  URETEROSCOPYLASER LITHOTRIPSY  AND STENT PLACEMENT AND STONE BASKETRY;  Surgeon: Kathie Rhodes, MD;  Location: [place];  Service: Urology;  Laterality: Right;   EYE SURGERY     cataract surgery   fatty mass removal  1999   pubic area   HOLMIUM LASER APPLICATION N/A 2/35/5732   Procedure: HOLMIUM LASER APPLICATION;  Surgeon: Kathie Rhodes, MD;  Location: [facility];  Service: Urology;  Laterality: N/A;   HOLMIUM LASER APPLICATION Right 10/25/5425   Procedure: HOLMIUM LASER APPLICATION;  Surgeon: Kathie Rhodes, MD;  Location[facility];  Service: Urology;  Laterality: Right;   HOLMIUM LASER APPLICATION Left 0/62/3762   Procedure: HOLMIUM LASER APPLICATION;  Surgeon: Kathie Rhodes, MD;  Location: [facility];  Service: Urology;  Laterality: Left;   I & D EXTREMITY Left 12/19/2020   Procedure: IRRIGATION AND DEBRIDEMENT OF LEFT HIP HEMATOMA WITH APPLICATION OF WOUND VAC;  Surgeon: Erle Crocker, MD;  Location: [place];  Service: Orthopedics;  Laterality: Left;   I & D EXTREMITY Left 02/06/2021    Procedure: IRRIGATION AND DEBRIDEMENT DEEP ABCESS LEFT THIGH, SECONDARY CLOSURE OF WOUND DEHISCENCE;  Surgeon: Erle Crocker, MD;  Location: [place] [facility];  Service: Orthopedics;  Laterality: Left;   IR FLUORO GUIDE PORT INSERTION RIGHT  02/24/2017   IR NEPHROSTOMY PLACEMENT RIGHT  11/17/2017   IR PATIENT EVAL TECH 0-60 MINS  03/11/2017   IR REMOVAL TUN ACCESS W/ PORT W/O FL MOD SED  04/20/2018   IR US GUIDE VASC ACCESS RIGHT  02/24/2017   KNEE ARTHROSCOPY     bilateral   LAPAROSCOPIC GASTRIC BANDING  07/10/2010   LAPAROSCOPIC GASTRIC BANDING     Laparoscopic adjustable banding APS System with posterior hiatal hernia, 2 suture.   LAPAROTOMY     for ruptured ovary and ovarian artery  NEPHROLITHOTOMY Right 11/17/2017   Procedure: NEPHROLITHOTOMY PERCUTANEOUS;  Surgeon: Kathie Rhodes, MD;  [facility];  Service: Urology;  Laterality: Right;   PACEMAKER INSERTION  03/10/2013   MDT dual chamber PPM   POCKET REVISION N/A 12/08/2013   Procedure: POCKET REVISION;  Surgeon: Deboraha Sprang, MD;  Location: [facility] CATH LAB;  Service: Cardiovascular;  Laterality: N/A;   PORTA CATH INSERTION     REVERSE SHOULDER ARTHROPLASTY Right 05/14/2018   Procedure: RIGHT REVERSE SHOULDER ARTHROPLASTY;  Surgeon: Tania Ade, MD;  Location: [place];  Service: Orthopedics;  Laterality: Right;   REVERSE SHOULDER REPLACEMENT Right 05/14/2018   RIGHT HEART CATH N/A 07/21/2019   Procedure: RIGHT HEART CATH;  Surgeon: Larey Dresser, MD;  Location: [place] CV LAB;  Service: Cardiovascular;  Laterality: N/A;   TEE WITH CARDIOVERSION  09/22/2017   TEE WITHOUT CARDIOVERSION N/A 10/03/2017   Procedure: TRANSESOPHAGEAL ECHOCARDIOGRAM (TEE);  Surgeon: Sanda Klein, MD;  Location: [facility] ENDOSCOPY;  Service: Cardiovascular;  Laterality: N/A;   TEE WITHOUT CARDIOVERSION N/A 08/04/2019   Procedure: TRANSESOPHAGEAL ECHOCARDIOGRAM (TEE);  Surgeon: Larey Dresser, MD;  Location: [facility] ENDOSCOPY;  Service: Cardiovascular;   Laterality: N/A;   TEE WITHOUT CARDIOVERSION N/A 12/10/2019   Procedure: TRANSESOPHAGEAL ECHOCARDIOGRAM (TEE);  Surgeon: Buford Dresser, MD;  Location: [facility] ENDOSCOPY;  Service: Cardiovascular;  Laterality: N/A;   TEE WITHOUT CARDIOVERSION N/A 03/09/2021   Procedure: TRANSESOPHAGEAL ECHOCARDIOGRAM (TEE);  Surgeon: Pixie Casino, MD;  Location: [facility] ENDOSCOPY;  Service: Cardiovascular;  Laterality: N/A;   TEE WITHOUT CARDIOVERSION N/A 10/04/2021   Procedure: TRANSESOPHAGEAL ECHOCARDIOGRAM (TEE);  Surgeon: Jerline Pain, MD;  Location: [facility] ENDOSCOPY;  Service: Cardiovascular;  Laterality: N/A;   THYROIDECTOMY  1998   Dr Margot Chimes   TONSILLECTOMY     TOTAL KNEE ARTHROPLASTY  04/13/2012   Procedure: TOTAL KNEE ARTHROPLASTY;  Surgeon: Rudean Haskell, MD;  Location: [place];  Service: Orthopedics;  Laterality: Right;   VIDEO BRONCHOSCOPY WITH ENDOBRONCHIAL ULTRASOUND N/A 02/07/2017   Procedure: VIDEO BRONCHOSCOPY WITH ENDOBRONCHIAL ULTRASOUND;  Surgeon: Marshell Garfinkel, MD;  Location: [place];  Service: Pulmonary;  Laterality: N/A;    Vitals:   11/06/21 1117  BP: 135/74  Pulse: 71  SpO2: 98%     Subjective Assessment - 11/06/21 1105     Subjective No recent falls. Still feeling fatigued. "I want to lay down and fall asleep all the time."    Pertinent History anemia, breast CA s/p surgery, lung CA s/p radiation and chemo, lymphoma on chemo, thyroid CA, HLD, hiatal hernia, HTN, a-fib, pacemaker, R reverse TSA 2019, stroke 2003, SVC syndrome, R TKA 2013    Diagnostic tests 03/15/21 head CT: Similar appearance of left caudothalamic hemorrhage with  intraventricular extension and mild adjacent edema. No new  hemorrhage. Unchanged mild trapping of the left lateral ventricle    Patient Stated Goals be able to get up off the floor    Currently in Pain? No/denies                               [facility] Adult PT Treatment/Exercise - 11/06/21 0001       Neuro Re-ed    Neuro Re-ed  Details  R/L forward/back stepping with one foot fixed and stopping at neutral with CGA-min A and 1 UE support on wall 10x; sidestepping with fingertips on wall 4x      Knee/Hip Exercises: Stretches   Active Hamstring Stretch Right;Left;2 reps;20 seconds  Active Hamstring Stretch Limitations sitting; cues for hip hinge      Knee/Hip Exercises: Aerobic   Nustep Level 5 all 4 extremities x 6 minutes with steps per minute >75   cues for pacing     Knee/Hip Exercises: Seated   Sit to Sand 1 set;10 reps;with UE support   pushing off knees                    PT Education - 11/06/21 1225     Education Details edu on benefits of contacting social work to assist in finding help with ADLs at home or finding assisted housing- patient declined; also suggested grocery delivery services    Person(s) Educated Patient    Methods Explanation    Comprehension Verbalized understanding              PT Short Term Goals - 09/28/21 1050       PT SHORT TERM GOAL #1   Title Patient to be independent with initial HEP.    Time 3    Period Weeks    Status Achieved    Target Date 08/08/21               PT Long Term Goals - 09/28/21 1051       PT LONG TERM GOAL #1   Title Patient to be independent with advanced HEP.    Time 6    Period Weeks    Status On-going   limited compliance d/t medical concerns/issues   Target Date 11/09/21      PT LONG TERM GOAL #2   Title Patient to demonstrate B LE strength >/=4+/5.    Time 6    Period Weeks    Status Partially Met   weakness still remaining   Target Date 11/09/21      PT LONG TERM GOAL #3   Title Patient to score at least 46/56 on [place] in order to decrease risk of falls.    Time 6    Period Weeks    Status On-going   34/56   Target Date 11/09/21      PT LONG TERM GOAL #4   Title Patient to complete TUG in <14 sec with LRAD in order to decrease risk of falls.    Time 6    Period Weeks    Status On-going   18.7 sec with  [facility]   Target Date 11/09/21      PT LONG TERM GOAL #5   Title Patient to demonstrate 5xSTS test in <15 sec in order to decrease risk of falls.    Time 6    Period Weeks    Status On-going   requires increased time d/t attempting without UEs; 50 sec   Target Date 11/09/21      PT LONG TERM GOAL #6   Title Patient to demonstrate safe floor transfer with mod I.    Time 6    Period Weeks    Status Deferred   NT d/t safety   Target Date 11/09/21                   Plan - 11/06/21 1226     Clinical Impression Statement Patient arrived to session with report of continued fatigue. Denies recent falls. Patient requesting BP check, which appeared only slightly elevated in systolic as expected after warm up. Patient with improved success with STS transfers today; requiring only minor verbal cueing to increase anterior trunk translation. At  least 1 UE support and CGA required to stabilize during balance activities. Frequent sit breaks required d/t fatigue. Patient reports low appetite and feeling that she could starve myself at home d/t lack of food at home d/t not having the energy to go to the store. Educated patient on reason why social services was reaching out in order to help her at home and assist in finding assisted housing. Patient declined assistance for 2nd time. Ended session a few minutes early d/t fatigue and advised patient to sit in waiting room for a couple minutes before leaving. Patient reported understanding and without other complaints upon leaving.    Personal Factors and Comorbidities Age;Fitness;Comorbidity 3+;Transportation;Time since onset of injury/illness/exacerbation;Past/Current Experience    Comorbidities anemia, breast CA s/p surgery, lung CA s/p radiation and chemo, lymphoma on chemo, thyroid CA, HLD, hiatal hernia, HTN, a-fib, pacemaker, R reverse TSA 2019, stroke 2003, SVC syndrome, R TKA 2013    Examination-Activity Limitations Bathing;Locomotion  Level;Transfers;Bed Mobility;Reach Overhead;Bend;Carry;Squat;Dressing;Stairs;Stand;Hygiene/Grooming;Lift;Toileting    Examination-Participation Restrictions Laundry;Shop;Community Activity;Cleaning;Church;Meal Prep    Stability/Clinical Decision Making Stable/Uncomplicated    Rehab Potential Good    PT Frequency Other (comment)   1-2x   PT Duration 6 weeks    PT Treatment/Interventions ADLs/Self Care Home Management;Cryotherapy;Electrical Stimulation;DME Instruction;Moist Heat;Gait training;Stair training;Functional mobility training;Therapeutic activities;Therapeutic exercise;Balance training;Neuromuscular re-education;Manual techniques;Patient/family education;Passive range of motion;Dry needling;Energy conservation;Vestibular;Vasopneumatic Device;Taping    PT Next Visit Plan Continue balance training with SLS activities; Continue to progress BLE stretching and functional strengthening to improve posture and gait deviations; Encourage gait with RW when not in therapy sessions; D/C on 02/16 appointment    Consulted and Agree with Plan of Care Patient             Patient will benefit from skilled therapeutic intervention in order to improve the following deficits and impairments:  Abnormal gait, Decreased range of motion, Difficulty walking, Increased fascial restricitons, Increased muscle spasms, Decreased safety awareness, Decreased endurance, Cardiopulmonary status limiting activity, Decreased activity tolerance, Decreased balance, Impaired flexibility, Improper body mechanics, Postural dysfunction, Decreased strength  Visit Diagnosis: Muscle weakness (generalized)  Unsteadiness on feet  Other abnormalities of gait and mobility     Problem List Patient Active Problem List   Diagnosis Date Noted   Hypertension    Pressure injury of skin 03/15/2021   Intracranial hemorrhage ([place]) 03/14/2021   Lower extremity edema    Polymicrobial bacterial infection    Carrier of MDR  Acinetobacter baumannii    Enterococcus faecalis infection    Sepsis due to Escherichia coli ([place])    Debility 02/13/2021   Infection of wound hematoma 02/05/2021   Physical debility 12/22/2020   Tear of left hamstring 12/22/2020   Labral tear of left hip joint 12/22/2020   Acute blood loss anemia    Orthostatic hypotension 12/17/2020   History of CVA (cerebrovascular accident) 12/17/2020   Fall 12/15/2020   Hip hematoma, left, initial encounter 12/15/2020   Nondisplaced fracture of coronoid process of left ulna, initial encounter for closed fracture 12/15/2020   Multiple rib fractures 12/15/2020   COVID-19 virus infection 10/12/2020   Constipation 06/22/2020   Change in bowel habits 06/22/2020   Diverticulosis 06/22/2020   Dark stools 06/22/2020   Hemorrhoids 06/22/2020   Pacemaker 01/23/2020   Junctional rhythm 01/23/2020   Palpitations 01/23/2020   ICH (intracerebral hemorrhage) ([place]) 12/02/2019   A-fib ([place]) 12/02/2019   Anemia 12/02/2019   QT prolongation 12/02/2019   Hypothyroidism 12/02/2019   Gait abnormality 07/10/2018   S/P reverse total shoulder  arthroplasty, 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   Persistent atrial fibrillation ([place])    Bilateral ureteral calculi 11/17/2017   Atrial fibrillation with RVR ([place]) 09/15/2017   Atrial flutter ([place]) 07/17/2017   Port catheter in place 04/02/2017   Non-Hodgkin lymphoma, unspecified, intrathoracic lymph nodes ([place]) 02/13/2017   Mediastinal mass 02/06/2017   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   Abnormal chest x-ray    Lung mass 02/03/2017   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   OSA (obstructive sleep apnea) 02/03/2015   History of renal calculi 11/16/2014   Renal calculi 11/16/2014   Family history of colon cancer 10/26/2014   Mechanical complication due to cardiac pacemaker pulse generator 11/06/2013   Dyspnea 05/12/2013   Hypothyroidism 04/21/2013   Pleural effusion 04/19/2013   Long term (current) use of  anticoagulants 12/20/2010   EPIDERMOID CYST 08/22/2010   Chronic diastolic heart failure ([place]) 08/06/2010   SYNCOPE AND COLLAPSE 07/27/2010   OSTEOARTHRITIS, KNEE, RIGHT 03/15/2010   Class 1 obesity due to excess calories with body mass index (BMI) of 33.0 to 33.9 in adult 12/07/2009   NONSPEC ELEVATION OF LEVELS OF TRANSAMINASE/LDH 09/08/2009   BREAST CANCER, HX OF 07/25/2009   COLONIC POLYPS, HX OF 07/25/2009   TUBULOVILLOUS ADENOMA, COLON 04/29/2008   HYPERGLYCEMIA, FASTING 04/29/2008   HYPERLIPIDEMIA 06/22/2007   CARCINOMA, THYROID GLAND, HX OF 06/22/2007     Janene Harvey, PT, DPT 11/06/21 12:29 PM   [facility] [address], [place], [place], [address] Phone: [phone]   Fax:  [phone]  Name: Kayla Harrison MRN: [id number] Date of Birth: 04-20-1946

## 2021-11-08 ENCOUNTER — Other Ambulatory Visit: Payer: Self-pay

## 2021-11-08 ENCOUNTER — Ambulatory Visit: Payer: Medicare Other | Admitting: Physical Therapy

## 2021-11-08 ENCOUNTER — Encounter: Payer: Self-pay | Admitting: Physical Therapy

## 2021-11-08 VITALS — BP 118/75 | HR 70

## 2021-11-08 DIAGNOSIS — R41840 Attention and concentration deficit: Secondary | ICD-10-CM | POA: Diagnosis not present

## 2021-11-08 DIAGNOSIS — R2681 Unsteadiness on feet: Secondary | ICD-10-CM

## 2021-11-08 DIAGNOSIS — M6281 Muscle weakness (generalized): Secondary | ICD-10-CM

## 2021-11-08 DIAGNOSIS — R2689 Other abnormalities of gait and mobility: Secondary | ICD-10-CM

## 2021-11-08 NOTE — Therapy (Addendum)
[facility] [address] [place], [place], [address] Phone: [phone]   Fax:  [phone]  Physical Therapy Discharge Summary  Patient Details  Name: Kayla Harrison MRN: [id number] Date of Birth: December 08, 1945 Referring Provider (PT): Garvin Fila, MD  Progress Note Reporting Period 08/31/21 to 11/08/21  See note below for Objective Data and Assessment of Progress/Goals.     Encounter Date: 11/08/2021   PT End of Session - 11/08/21 1117     Visit Number 20    Number of Visits 24    Date for PT Re-Evaluation 11/09/21    Authorization Type Medicare & MoO    PT Start Time 1025    PT Stop Time 1110    PT Time Calculation (min) 45 min    Equipment Utilized During Treatment Gait belt    Activity Tolerance Patient limited by fatigue;Patient tolerated treatment well    Behavior During Therapy WFL for tasks assessed/performed             Past Medical History:  Diagnosis Date   Anemia    Arthritis    osteoarthritis - knees and right shoulder   Blood transfusion without reported diagnosis    Breast cancer ([place])    Dr Margot Chimes, total thyroidectomy- 1999- for cancer   Brucellosis 1964   Chronic bilateral pleural effusions    Colon polyp    Dr Earlean Shawl   Complication of anesthesia    Ketamine produces LSD reaction, bright colored nightmarish experience    Dyslipidemia    Endometriosis    Fibroids    H/O pleural effusion    s/p thoracentesis w 3280m withdrawn   Hepatitis    [place], ?hepatitis    History of dysphagia    due to radiation therapy   History of hiatal hernia    small noted on PET scan   Hypertension    Hypothyroidism    Lung cancer, lower lobe ([place] 01/[place]   radiation RX completed 03/04/17; will start chemo 6/27, pt unaware of lung cancer   Morbid obesity ([place]    Status post lap band surgery   Nephrolithiasis    Non Hodgkin's lymphoma ([place]    on chemotherapy    Persistent atrial fibrillation ([place]    a. s/p PVI 2008 b. s/p convergent ablation [id number] by bradycardia requiring pacemaker implant   Personal history of radiation therapy    Presence of permanent cardiac pacemaker    Rotator cuff tear    Right   [place]   SVC syndrome    with lung mass and non hodgkins lymphoma   Thyroid cancer ([place] 2000    Past Surgical History:  Procedure Laterality Date   ABDOMINAL HYSTERECTOMY  1983   afib ablation     a. 2008 PVI b. 2014 convergent ablation   APPENDECTOMY     BONE MARROW BIOPSY  02/21/2017   BREAST LUMPECTOMY Left 2010   [place]    2015- negative   CARDIOVERSION  10/09/2012   Procedure: CARDIOVERSION;  Surgeon: JMinus Breeding MD;  [place] [facility]  Service: Cardiovascular;  Laterality: N/A;   CARDIOVERSION  10/09/2012   Procedure: CARDIOVERSION;  Surgeon: JMinus Breeding MD;  [facility]  Service: Cardiovascular;  Laterality: N/A;Ronalee Beltsgave the ok to add pt to the add on , but we must check to find out  if the can add pt on at 1400 [phone])   CARDIOVERSION N/A 11/20/2012   Procedure: CARDIOVERSION;  Surgeon: Fay Records, MD;  Location: [place];  Service: Cardiovascular;  Laterality: N/A;   CARDIOVERSION N/A 07/18/2017   Procedure: CARDIOVERSION;  Surgeon: Thayer Headings, MD;  Location: [place];  Service: Cardiovascular;  Laterality: N/A;   CARDIOVERSION N/A 10/03/2017   Procedure: CARDIOVERSION;  Surgeon: Sanda Klein, MD;  [facility];  Service: Cardiovascular;  Laterality: N/A;   CARDIOVERSION N/A 01/07/2018   Procedure: CARDIOVERSION;  Surgeon: Acie Fredrickson Wonda Cheng, MD;  Location: [facility] ENDOSCOPY;  Service: Cardiovascular;  Laterality: N/A;   CARDIOVERSION N/A 12/10/2019   Procedure: CARDIOVERSION;  Surgeon: Buford Dresser, MD;  Location: [facility] ENDOSCOPY;  Service: Cardiovascular;  Laterality: N/A;   CARDIOVERSION N/A 03/09/2021    Procedure: CARDIOVERSION;  Surgeon: Pixie Casino, MD;  Location: [facility] ENDOSCOPY;  Service: Cardiovascular;  Laterality: N/A;   CARDIOVERSION N/A 10/04/2021   Procedure: CARDIOVERSION;  Surgeon: Jerline Pain, MD;  Location: [place];  Service: Cardiovascular;  Laterality: N/A;   CHOLECYSTECTOMY     COLONOSCOPY W/ POLYPECTOMY     Dr Earlean Shawl   CYSTOSCOPY N/A 02/06/2015   Procedure: CYSTOSCOPY;  Surgeon: Kathie Rhodes, MD;  [facility];  Service: Urology;  Laterality: N/A;   CYSTOSCOPY W/ RETROGRADES Left 11/17/2017   Procedure: CYSTOSCOPY WITH RETROGRADE /PYELOGRAM/;  Surgeon: Kathie Rhodes, MD;  [facility];  Service: Urology;  Laterality: Left;   CYSTOSCOPY WITH RETROGRADE PYELOGRAM, URETEROSCOPY AND STENT PLACEMENT Right 02/06/2015   Procedure: RETROGRADE PYELOGRAM, RIGHT URETEROSCOPY STENT PLACEMENT;  Surgeon: Kathie Rhodes, MD;  [facility];  Service: Urology;  Laterality: Right;   CYSTOSCOPY WITH RETROGRADE PYELOGRAM, URETEROSCOPY AND STENT PLACEMENT Right 03/07/2017   Procedure: CYSTOSCOPY WITH RIGHT RETROGRADE PYELOGRAM,RIGHT  URETEROSCOPYLASER LITHOTRIPSY  AND STENT PLACEMENT AND STONE BASKETRY;  Surgeon: Kathie Rhodes, MD;  Location: [place];  Service: Urology;  Laterality: Right;   EYE SURGERY     cataract surgery   fatty mass removal  1999   pubic area   HOLMIUM LASER APPLICATION N/A 7/68/0881   Procedure: HOLMIUM LASER APPLICATION;  Surgeon: Kathie Rhodes, MD;  [facility];  Service: Urology;  Laterality: N/A;   HOLMIUM LASER APPLICATION Right 09/25/1592   Procedure: HOLMIUM LASER APPLICATION;  Surgeon: Kathie Rhodes, MD;  Location[facility];  Service: Urology;  Laterality: Right;   HOLMIUM LASER APPLICATION Left 5/85/9292   Procedure: HOLMIUM LASER APPLICATION;  Surgeon: Kathie Rhodes, MD;  [facility];  Service: Urology;  Laterality: Left;   I & D EXTREMITY Left 12/19/2020   Procedure: IRRIGATION AND DEBRIDEMENT OF LEFT  HIP HEMATOMA WITH APPLICATION OF WOUND VAC;  Surgeon: Erle Crocker, MD;  Location: [place];  Service: Orthopedics;  Laterality: Left;   I & D EXTREMITY Left 02/06/2021   Procedure: IRRIGATION AND DEBRIDEMENT DEEP ABCESS LEFT THIGH, SECONDARY CLOSURE OF WOUND DEHISCENCE;  Surgeon: Erle Crocker, MD;  Location: [place];  Service: Orthopedics;  Laterality: Left;   IR FLUORO GUIDE PORT INSERTION RIGHT  02/24/2017   IR NEPHROSTOMY PLACEMENT RIGHT  11/17/2017   IR PATIENT EVAL TECH 0-60 MINS  03/11/2017   IR REMOVAL TUN ACCESS W/ PORT W/O FL MOD SED  04/20/2018   IR US GUIDE VASC ACCESS RIGHT  02/24/2017   KNEE ARTHROSCOPY     bilateral   LAPAROSCOPIC GASTRIC BANDING  07/10/2010   LAPAROSCOPIC GASTRIC BANDING     Laparoscopic  adjustable banding APS System with posterior hiatal hernia, 2 suture.   LAPAROTOMY     for ruptured ovary and ovarian artery    NEPHROLITHOTOMY Right 11/17/2017   Procedure: NEPHROLITHOTOMY PERCUTANEOUS;  Surgeon: Kathie Rhodes, MD;  [facility];  Service: Urology;  Laterality: Right;   PACEMAKER INSERTION  03/10/2013   MDT dual chamber PPM   POCKET REVISION N/A 12/08/2013   Procedure: POCKET REVISION;  Surgeon: Deboraha Sprang, MD;  Location: [facility] CATH LAB;  Service: Cardiovascular;  Laterality: N/A;   PORTA CATH INSERTION     REVERSE SHOULDER ARTHROPLASTY Right 05/14/2018   Procedure: RIGHT REVERSE SHOULDER ARTHROPLASTY;  Surgeon: Tania Ade, MD;  Location: [place];  Service: Orthopedics;  Laterality: Right;   REVERSE SHOULDER REPLACEMENT Right 05/14/2018   RIGHT HEART CATH N/A 07/21/2019   Procedure: RIGHT HEART CATH;  Surgeon: Larey Dresser, MD;  Location: [place] CV LAB;  Service: Cardiovascular;  Laterality: N/A;   TEE WITH CARDIOVERSION  09/22/2017   TEE WITHOUT CARDIOVERSION N/A 10/03/2017   Procedure: TRANSESOPHAGEAL ECHOCARDIOGRAM (TEE);  Surgeon: Sanda Klein, MD;  Location: [facility] ENDOSCOPY;  Service: Cardiovascular;  Laterality: N/A;   TEE WITHOUT  CARDIOVERSION N/A 08/04/2019   Procedure: TRANSESOPHAGEAL ECHOCARDIOGRAM (TEE);  Surgeon: Larey Dresser, MD;  Location: [facility] ENDOSCOPY;  Service: Cardiovascular;  Laterality: N/A;   TEE WITHOUT CARDIOVERSION N/A 12/10/2019   Procedure: TRANSESOPHAGEAL ECHOCARDIOGRAM (TEE);  Surgeon: Buford Dresser, MD;  Location: [facility] ENDOSCOPY;  Service: Cardiovascular;  Laterality: N/A;   TEE WITHOUT CARDIOVERSION N/A 03/09/2021   Procedure: TRANSESOPHAGEAL ECHOCARDIOGRAM (TEE);  Surgeon: Pixie Casino, MD;  Location: [facility] ENDOSCOPY;  Service: Cardiovascular;  Laterality: N/A;   TEE WITHOUT CARDIOVERSION N/A 10/04/2021   Procedure: TRANSESOPHAGEAL ECHOCARDIOGRAM (TEE);  Surgeon: Jerline Pain, MD;  Location: [facility] ENDOSCOPY;  Service: Cardiovascular;  Laterality: N/A;   THYROIDECTOMY  1998   Dr Margot Chimes   TONSILLECTOMY     TOTAL KNEE ARTHROPLASTY  04/13/2012   Procedure: TOTAL KNEE ARTHROPLASTY;  Surgeon: Rudean Haskell, MD;  Location: [place];  Service: Orthopedics;  Laterality: Right;   VIDEO BRONCHOSCOPY WITH ENDOBRONCHIAL ULTRASOUND N/A 02/07/2017   Procedure: VIDEO BRONCHOSCOPY WITH ENDOBRONCHIAL ULTRASOUND;  Surgeon: Marshell Garfinkel, MD;  Location: [place];  Service: Pulmonary;  Laterality: N/A;    Vitals:   11/08/21 1028  BP: 118/75  Pulse: 70  SpO2: 96%     Subjective Assessment - 11/08/21 1023     Subjective No new changes. Agreeable to D/C today. Has not scheduled an appointment with PCP yet.    Pertinent History anemia, breast CA s/p surgery, lung CA s/p radiation and chemo, lymphoma on chemo, thyroid CA, HLD, hiatal hernia, HTN, a-fib, pacemaker, R reverse TSA 2019, stroke 2003, SVC syndrome, R TKA 2013    Diagnostic tests 03/15/21 head CT: Similar appearance of left caudothalamic hemorrhage with  intraventricular extension and mild adjacent edema. No new  hemorrhage. Unchanged mild trapping of the left lateral ventricle    Patient Stated Goals be able to get up off the floor    Currently in  Pain? No/denies                [facility] PT Assessment - 11/08/21 0001       Assessment   Medical Diagnosis Nontraumatic subcortical hemorrhage of left cerebral hemisphere    Referring Provider (PT) Garvin Fila, MD    Onset Date/Surgical Date 02/21/21      Strength   Right Hip Flexion 4-/5  Right Hip ABduction 4+/5    Right Hip ADduction 4/5    Left Hip Flexion 4/5    Left Hip ABduction 4/5    Left Hip ADduction 4/5    Right Knee Flexion 4/5    Right Knee Extension 4+/5    Left Knee Flexion 4-/5    Left Knee Extension 4+/5    Right Ankle Dorsiflexion 4+/5    Right Ankle Plantar Flexion 4-/5    Left Ankle Dorsiflexion 4/5    Left Ankle Plantar Flexion 4-/5      Standardized Balance Assessment   Five times sit to stand comments  23.15 sec   using armrests     Berg Balance Test   Sit to Stand Able to stand  independently using hands    Standing Unsupported Able to stand 2 minutes with supervision    Sitting with Back Unsupported but Feet Supported on Floor or Stool Able to sit safely and securely 2 minutes    Stand to Sit Sits safely with minimal use of hands    Transfers Able to transfer safely, minor use of hands    Standing Unsupported with Eyes Closed Able to stand 10 seconds with supervision    Standing Unsupported with Feet Together Able to place feet together independently and stand for 1 minute with supervision    From Standing, Reach Forward with Outstretched Arm Can reach forward >12 cm safely (5")    From Standing Position, Pick up Object from Floor Able to pick up shoe safely and easily    From Standing Position, Turn to Look Behind Over each Shoulder Turn sideways only but maintains balance    Turn 360 Degrees Able to turn 360 degrees safely but slowly    Standing Unsupported, Alternately Place Feet on Step/Stool Needs assistance to keep from falling or unable to try    Standing Unsupported, One Foot in Front Needs help to step but can hold 15 seconds     Standing on One Leg Unable to try or needs assist to prevent fall    Total Score 36    Berg comment: frequent sitting breaks d/t fatigue      Timed Up and Go Test   Normal TUG (seconds) 20.19   with SPC; 2nd trial 18.09 sec                                   PT Education - 11/08/21 1116     Education Details detailed review of HEP- Access Code: [email or web address], edu on benefits of social work for assistance at home- administered Psychologist, counselling for [employer] from [employer]) Educated Patient    Methods Explanation;Demonstration;Tactile cues;Verbal cues;Handout    Comprehension Verbalized understanding              PT Short Term Goals - 11/08/21 1123       PT SHORT TERM GOAL #1   Title Patient to be independent with initial HEP.    Time 3    Period Weeks    Status Achieved    Target Date 08/08/21               PT Long Term Goals - 11/08/21 1123       PT LONG TERM GOAL #1   Title Patient to be independent with advanced HEP.    Time 6    Period Weeks    Status Achieved  Target Date 11/09/21      PT LONG TERM GOAL #2   Title Patient to demonstrate B LE strength >/=4+/5.    Time 6    Period Weeks    Status Partially Met    Target Date 11/09/21      PT LONG TERM GOAL #3   Title Patient to score at least 46/56 on Berg in order to decrease risk of falls.    Time 6    Period Weeks    Status Not Met   36/56   Target Date 11/09/21      PT LONG TERM GOAL #4   Title Patient to complete TUG in <14 sec with LRAD in order to decrease risk of falls.    Time 6    Period Weeks    Status Not Met   18.09 sec   Target Date 11/09/21      PT LONG TERM GOAL #5   Title Patient to demonstrate 5xSTS test in <15 sec in order to decrease risk of falls.    Time 6    Period Weeks    Status Not Met   23.15 sec with B UE support   Target Date 11/09/21      PT LONG TERM GOAL #6   Title Patient to demonstrate safe floor transfer with mod I.     Time 6    Period Weeks    Status Deferred   NT d/t safety   Target Date 11/09/21                   Plan - 11/08/21 1123     Clinical Impression Statement Patient arrived to session without new complaints. Vitals stable at start of session. Strength testing grossly unchanged. Patient scored 36/56 on Berg, indicating an increased risk of falls but improved from initial assessment. TUG and 5xSTS scores have declined from initial measurement. Floor transfer not assessed d/t safety. Reviewed HEP in detail with patient to ensure safety and carryover. Also educated patient on benefits of social work for assistance at home d/t previous comments about difficulty managing household. Patient reported understanding. At this time patient has hit a plateau with PT d/t other medical issues. Encouraged her to f/u with PCP to address excessive fatigue and weakness which is not responding to exercise. Patient in agreement with D/C today.    Personal Factors and Comorbidities Age;Fitness;Comorbidity 3+;Transportation;Time since onset of injury/illness/exacerbation;Past/Current Experience    Comorbidities anemia, breast CA s/p surgery, lung CA s/p radiation and chemo, lymphoma on chemo, thyroid CA, HLD, hiatal hernia, HTN, a-fib, pacemaker, R reverse TSA 2019, stroke 2003, SVC syndrome, R TKA 2013    Examination-Activity Limitations Bathing;Locomotion Level;Transfers;Bed Mobility;Reach Overhead;Bend;Carry;Squat;Dressing;Stairs;Stand;Hygiene/Grooming;Lift;Toileting    Examination-Participation Restrictions Laundry;Shop;Community Activity;Cleaning;Church;Meal Prep    Stability/Clinical Decision Making Stable/Uncomplicated    Rehab Potential Good    PT Frequency Other (comment)   1-2x   PT Duration 6 weeks    PT Treatment/Interventions ADLs/Self Care Home Management;Cryotherapy;Electrical Stimulation;DME Instruction;Moist Heat;Gait training;Stair training;Functional mobility training;Therapeutic  activities;Therapeutic exercise;Balance training;Neuromuscular re-education;Manual techniques;Patient/family education;Passive range of motion;Dry needling;Energy conservation;Vestibular;Vasopneumatic Device;Taping    PT Next Visit Plan DC at this time    Consulted and Agree with Plan of Care Patient             Patient will benefit from skilled therapeutic intervention in order to improve the following deficits and impairments:  Abnormal gait, Decreased range of motion, Difficulty walking, Increased fascial restricitons, Increased muscle spasms, Decreased safety awareness, Decreased endurance,  Cardiopulmonary status limiting activity, Decreased activity tolerance, Decreased balance, Impaired flexibility, Improper body mechanics, Postural dysfunction, Decreased strength  Visit Diagnosis: Muscle weakness (generalized)  Unsteadiness on feet  Other abnormalities of gait and mobility     Problem List Patient Active Problem List   Diagnosis Date Noted   Hypertension    Pressure injury of skin 03/15/2021   Intracranial hemorrhage (HCC) 03/14/2021   Lower extremity edema    Polymicrobial bacterial infection    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    Enterococcus faecalis infection    Sepsis due to Escherichia coli ([place])    Debility 02/13/2021   Infection of wound hematoma 02/05/2021   Physical debility 12/22/2020   Tear of left hamstring 12/22/2020   Labral tear of left hip joint 12/22/2020   Acute blood loss anemia    Orthostatic hypotension 12/17/2020   History of CVA (cerebrovascular accident) 12/17/2020   Fall 12/15/2020   Hip hematoma, left, initial encounter 12/15/2020   Nondisplaced fracture of coronoid process of left ulna, initial encounter for closed fracture 12/15/2020   Multiple rib fractures 12/15/2020   COVID-19 virus infection 10/12/2020   Constipation 06/22/2020   Change in bowel habits 06/22/2020   Diverticulosis 06/22/2020   Dark stools 06/22/2020    Hemorrhoids 06/22/2020   Pacemaker 01/23/2020   Junctional rhythm 01/23/2020   Palpitations 01/23/2020   ICH (intracerebral hemorrhage) ([place]) 12/02/2019   A-fib ([place]) 12/02/2019   Anemia 12/02/2019   QT prolongation 12/02/2019   Hypothyroidism 12/02/2019   Gait abnormality 07/10/2018   S/P reverse total shoulder arthroplasty, 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   Persistent atrial fibrillation ([place])    Bilateral ureteral calculi 11/17/2017   Atrial fibrillation with RVR ([place]) 09/15/2017   Atrial flutter ([place]) 07/17/2017   Port catheter in place 04/02/2017   Non-Hodgkin lymphoma, unspecified, intrathoracic lymph nodes ([place]) 02/13/2017   Mediastinal mass 02/06/2017   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/2017   Abnormal chest x-ray    Lung mass 02/03/2017   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   OSA (obstructive sleep apnea) 02/03/2015   History of renal calculi 11/16/2014   Renal calculi 11/16/2014   Family history of colon cancer 10/26/2014   Mechanical complication due to cardiac pacemaker pulse generator 11/06/2013   Dyspnea 05/12/2013   Hypothyroidism 04/21/2013   Pleural effusion 04/19/2013   Long term (current) use of anticoagulants 12/20/2010   EPIDERMOID CYST 08/22/2010   Chronic diastolic heart failure ([place]) 08/06/2010   SYNCOPE AND COLLAPSE 07/27/2010   OSTEOARTHRITIS, KNEE, RIGHT 03/15/2010   Class 1 obesity due to excess calories with body mass index (BMI) of 33.0 to 33.9 in adult 12/07/2009   NONSPEC ELEVATION OF LEVELS OF TRANSAMINASE/LDH 09/08/2009   BREAST CANCER, HX OF 07/25/2009   COLONIC POLYPS, HX OF 07/25/2009   TUBULOVILLOUS ADENOMA, COLON 04/29/2008   HYPERGLYCEMIA, FASTING 04/29/2008   HYPERLIPIDEMIA 06/22/2007   CARCINOMA, THYROID GLAND, HX OF 06/22/2007     PHYSICAL THERAPY DISCHARGE SUMMARY  Visits from Start of Care: 20  Current functional level related to goals / functional outcomes: See above clinical impression   Remaining deficits: LE weakness,  decreased gait speed and increased difficulty with transfers, imbalance   Education / Equipment: HEP  Plan: Patient agrees to discharge.  Patient goals were not met. Patient is being discharged due to plateau in progress with PT.      Janene Harvey, PT, DPT 11/08/21 11:27 AM   [facility] [address], [address] [place],  [place], [address] Phone: [phone]   Fax:  [phone]  Name: EDUARDO WURTH MRN: [id number] Date of Birth: 1946-02-13

## 2021-11-08 NOTE — Patient Instructions (Signed)
Access Code: [email or web address] URL: [email or web address] Date: 11/08/2021 Prepared by: [facility]  Program Notes perform at a counter top for safety!   Exercises Sit to Stand - 1 x daily - 3-4 x weekly - 2 sets - 5 reps Marching with Resistance - 1 x daily - 3-4 x weekly - 2 sets - 10 reps Side to Side Weight Shift with Counter Support - 1 x daily - 3-4 x weekly - 2 sets - 10 reps - 3 sec hold Gastroc Stretch on Wall - 1 x daily - 3-4 x weekly - 2 sets - 30 sec hold Standing Toe Taps - 1 x daily - 3-4 x weekly - 2 sets - 10 reps Side Stepping with Counter Support - 1 x daily - 3-4 x weekly - 2 sets - 10 reps

## 2021-11-12 ENCOUNTER — Other Ambulatory Visit: Payer: Self-pay

## 2021-11-12 ENCOUNTER — Emergency Department (HOSPITAL_COMMUNITY): Payer: Medicare Other

## 2021-11-12 ENCOUNTER — Encounter (HOSPITAL_COMMUNITY): Payer: Self-pay

## 2021-11-12 ENCOUNTER — Inpatient Hospital Stay (HOSPITAL_COMMUNITY)
Admission: EM | Admit: 2021-11-12 | Discharge: 2021-11-20 | DRG: 312 | Disposition: A | Discharge status: Skilled Nursing Facility | Payer: Medicare Other | Attending: Internal Medicine | Admitting: Internal Medicine

## 2021-11-12 DIAGNOSIS — Z20822 Contact with and (suspected) exposure to covid-19: Secondary | ICD-10-CM | POA: Diagnosis present

## 2021-11-12 DIAGNOSIS — R932 Abnormal findings on diagnostic imaging of liver and biliary tract: Secondary | ICD-10-CM | POA: Diagnosis not present

## 2021-11-12 DIAGNOSIS — E039 Hypothyroidism, unspecified: Secondary | ICD-10-CM | POA: Diagnosis present

## 2021-11-12 DIAGNOSIS — S7011XA Contusion of right thigh, initial encounter: Secondary | ICD-10-CM | POA: Diagnosis not present

## 2021-11-12 DIAGNOSIS — E782 Mixed hyperlipidemia: Secondary | ICD-10-CM | POA: Diagnosis present

## 2021-11-12 DIAGNOSIS — R41841 Cognitive communication deficit: Secondary | ICD-10-CM | POA: Diagnosis not present

## 2021-11-12 DIAGNOSIS — L8992 Pressure ulcer of unspecified site, stage 2: Secondary | ICD-10-CM

## 2021-11-12 DIAGNOSIS — M6259 Muscle wasting and atrophy, not elsewhere classified, multiple sites: Secondary | ICD-10-CM | POA: Diagnosis not present

## 2021-11-12 DIAGNOSIS — E669 Obesity, unspecified: Secondary | ICD-10-CM | POA: Diagnosis present

## 2021-11-12 DIAGNOSIS — M2578 Osteophyte, vertebrae: Secondary | ICD-10-CM | POA: Diagnosis not present

## 2021-11-12 DIAGNOSIS — N200 Calculus of kidney: Secondary | ICD-10-CM | POA: Diagnosis not present

## 2021-11-12 DIAGNOSIS — N390 Urinary tract infection, site not specified: Secondary | ICD-10-CM | POA: Diagnosis not present

## 2021-11-12 DIAGNOSIS — Z85850 Personal history of malignant neoplasm of thyroid: Secondary | ICD-10-CM

## 2021-11-12 DIAGNOSIS — J479 Bronchiectasis, uncomplicated: Secondary | ICD-10-CM | POA: Diagnosis not present

## 2021-11-12 DIAGNOSIS — S300XXA Contusion of lower back and pelvis, initial encounter: Secondary | ICD-10-CM | POA: Diagnosis not present

## 2021-11-12 DIAGNOSIS — Z9181 History of falling: Secondary | ICD-10-CM

## 2021-11-12 DIAGNOSIS — I10 Essential (primary) hypertension: Secondary | ICD-10-CM | POA: Diagnosis not present

## 2021-11-12 DIAGNOSIS — E785 Hyperlipidemia, unspecified: Secondary | ICD-10-CM | POA: Diagnosis present

## 2021-11-12 DIAGNOSIS — Z9221 Personal history of antineoplastic chemotherapy: Secondary | ICD-10-CM | POA: Diagnosis not present

## 2021-11-12 DIAGNOSIS — S0003XA Contusion of scalp, initial encounter: Secondary | ICD-10-CM | POA: Diagnosis present

## 2021-11-12 DIAGNOSIS — M6281 Muscle weakness (generalized): Secondary | ICD-10-CM | POA: Diagnosis not present

## 2021-11-12 DIAGNOSIS — N3090 Cystitis, unspecified without hematuria: Secondary | ICD-10-CM

## 2021-11-12 DIAGNOSIS — I4819 Other persistent atrial fibrillation: Secondary | ICD-10-CM | POA: Diagnosis present

## 2021-11-12 DIAGNOSIS — Z90710 Acquired absence of both cervix and uterus: Secondary | ICD-10-CM

## 2021-11-12 DIAGNOSIS — R102 Pelvic and perineal pain: Secondary | ICD-10-CM | POA: Diagnosis not present

## 2021-11-12 DIAGNOSIS — E038 Other specified hypothyroidism: Secondary | ICD-10-CM | POA: Diagnosis not present

## 2021-11-12 DIAGNOSIS — M170 Bilateral primary osteoarthritis of knee: Secondary | ICD-10-CM | POA: Diagnosis present

## 2021-11-12 DIAGNOSIS — Z79899 Other long term (current) drug therapy: Secondary | ICD-10-CM

## 2021-11-12 DIAGNOSIS — Z8249 Family history of ischemic heart disease and other diseases of the circulatory system: Secondary | ICD-10-CM

## 2021-11-12 DIAGNOSIS — Z9884 Bariatric surgery status: Secondary | ICD-10-CM

## 2021-11-12 DIAGNOSIS — J90 Pleural effusion, not elsewhere classified: Secondary | ICD-10-CM | POA: Diagnosis not present

## 2021-11-12 DIAGNOSIS — I951 Orthostatic hypotension: Secondary | ICD-10-CM | POA: Diagnosis not present

## 2021-11-12 DIAGNOSIS — J9811 Atelectasis: Secondary | ICD-10-CM | POA: Diagnosis not present

## 2021-11-12 DIAGNOSIS — S299XXA Unspecified injury of thorax, initial encounter: Secondary | ICD-10-CM | POA: Diagnosis not present

## 2021-11-12 DIAGNOSIS — T148XXA Other injury of unspecified body region, initial encounter: Secondary | ICD-10-CM | POA: Diagnosis not present

## 2021-11-12 DIAGNOSIS — K5730 Diverticulosis of large intestine without perforation or abscess without bleeding: Secondary | ICD-10-CM | POA: Diagnosis not present

## 2021-11-12 DIAGNOSIS — Z6833 Body mass index (BMI) 33.0-33.9, adult: Secondary | ICD-10-CM

## 2021-11-12 DIAGNOSIS — R0602 Shortness of breath: Secondary | ICD-10-CM

## 2021-11-12 DIAGNOSIS — S7001XA Contusion of right hip, initial encounter: Secondary | ICD-10-CM | POA: Diagnosis not present

## 2021-11-12 DIAGNOSIS — W19XXXA Unspecified fall, initial encounter: Secondary | ICD-10-CM | POA: Diagnosis not present

## 2021-11-12 DIAGNOSIS — Z950 Presence of cardiac pacemaker: Secondary | ICD-10-CM

## 2021-11-12 DIAGNOSIS — B9620 Unspecified Escherichia coli [E. coli] as the cause of diseases classified elsewhere: Secondary | ICD-10-CM | POA: Diagnosis present

## 2021-11-12 DIAGNOSIS — Z79890 Hormone replacement therapy (postmenopausal): Secondary | ICD-10-CM

## 2021-11-12 DIAGNOSIS — R262 Difficulty in walking, not elsewhere classified: Secondary | ICD-10-CM | POA: Diagnosis not present

## 2021-11-12 DIAGNOSIS — E890 Postprocedural hypothyroidism: Secondary | ICD-10-CM | POA: Diagnosis not present

## 2021-11-12 DIAGNOSIS — R2681 Unsteadiness on feet: Secondary | ICD-10-CM | POA: Diagnosis not present

## 2021-11-12 DIAGNOSIS — Z23 Encounter for immunization: Secondary | ICD-10-CM

## 2021-11-12 DIAGNOSIS — I6782 Cerebral ischemia: Secondary | ICD-10-CM | POA: Diagnosis not present

## 2021-11-12 DIAGNOSIS — Z91018 Allergy to other foods: Secondary | ICD-10-CM

## 2021-11-12 DIAGNOSIS — Z96611 Presence of right artificial shoulder joint: Secondary | ICD-10-CM | POA: Diagnosis present

## 2021-11-12 DIAGNOSIS — Z85118 Personal history of other malignant neoplasm of bronchus and lung: Secondary | ICD-10-CM | POA: Diagnosis not present

## 2021-11-12 DIAGNOSIS — Z9049 Acquired absence of other specified parts of digestive tract: Secondary | ICD-10-CM

## 2021-11-12 DIAGNOSIS — S0990XA Unspecified injury of head, initial encounter: Secondary | ICD-10-CM

## 2021-11-12 DIAGNOSIS — L89892 Pressure ulcer of other site, stage 2: Secondary | ICD-10-CM | POA: Diagnosis present

## 2021-11-12 DIAGNOSIS — N3080 Other cystitis without hematuria: Secondary | ICD-10-CM | POA: Diagnosis not present

## 2021-11-12 DIAGNOSIS — R55 Syncope and collapse: Secondary | ICD-10-CM

## 2021-11-12 DIAGNOSIS — R296 Repeated falls: Secondary | ICD-10-CM | POA: Diagnosis present

## 2021-11-12 DIAGNOSIS — D6832 Hemorrhagic disorder due to extrinsic circulating anticoagulants: Secondary | ICD-10-CM | POA: Diagnosis present

## 2021-11-12 DIAGNOSIS — S8011XA Contusion of right lower leg, initial encounter: Secondary | ICD-10-CM | POA: Diagnosis not present

## 2021-11-12 DIAGNOSIS — I959 Hypotension, unspecified: Secondary | ICD-10-CM | POA: Diagnosis not present

## 2021-11-12 DIAGNOSIS — Z853 Personal history of malignant neoplasm of breast: Secondary | ICD-10-CM

## 2021-11-12 DIAGNOSIS — W010XXA Fall on same level from slipping, tripping and stumbling without subsequent striking against object, initial encounter: Secondary | ICD-10-CM | POA: Diagnosis present

## 2021-11-12 DIAGNOSIS — R42 Dizziness and giddiness: Secondary | ICD-10-CM | POA: Diagnosis not present

## 2021-11-12 DIAGNOSIS — D62 Acute posthemorrhagic anemia: Secondary | ICD-10-CM | POA: Diagnosis not present

## 2021-11-12 DIAGNOSIS — K5900 Constipation, unspecified: Secondary | ICD-10-CM | POA: Diagnosis not present

## 2021-11-12 DIAGNOSIS — R1314 Dysphagia, pharyngoesophageal phase: Secondary | ICD-10-CM | POA: Diagnosis not present

## 2021-11-12 DIAGNOSIS — Z7401 Bed confinement status: Secondary | ICD-10-CM | POA: Diagnosis not present

## 2021-11-12 DIAGNOSIS — Z8679 Personal history of other diseases of the circulatory system: Secondary | ICD-10-CM | POA: Diagnosis not present

## 2021-11-12 DIAGNOSIS — Z8673 Personal history of transient ischemic attack (TIA), and cerebral infarction without residual deficits: Secondary | ICD-10-CM

## 2021-11-12 DIAGNOSIS — I700 Atherosclerosis of aorta: Secondary | ICD-10-CM | POA: Diagnosis not present

## 2021-11-12 DIAGNOSIS — Z96651 Presence of right artificial knee joint: Secondary | ICD-10-CM | POA: Diagnosis present

## 2021-11-12 DIAGNOSIS — Z923 Personal history of irradiation: Secondary | ICD-10-CM

## 2021-11-12 DIAGNOSIS — Z888 Allergy status to other drugs, medicaments and biological substances status: Secondary | ICD-10-CM

## 2021-11-12 DIAGNOSIS — S3993XA Unspecified injury of pelvis, initial encounter: Secondary | ICD-10-CM | POA: Diagnosis not present

## 2021-11-12 DIAGNOSIS — G4733 Obstructive sleep apnea (adult) (pediatric): Secondary | ICD-10-CM | POA: Diagnosis present

## 2021-11-12 DIAGNOSIS — Z7901 Long term (current) use of anticoagulants: Secondary | ICD-10-CM

## 2021-11-12 DIAGNOSIS — R2689 Other abnormalities of gait and mobility: Secondary | ICD-10-CM | POA: Diagnosis not present

## 2021-11-12 DIAGNOSIS — Z20828 Contact with and (suspected) exposure to other viral communicable diseases: Secondary | ICD-10-CM | POA: Diagnosis not present

## 2021-11-12 DIAGNOSIS — R9431 Abnormal electrocardiogram [ECG] [EKG]: Secondary | ICD-10-CM | POA: Diagnosis present

## 2021-11-12 DIAGNOSIS — T1490XA Injury, unspecified, initial encounter: Secondary | ICD-10-CM

## 2021-11-12 HISTORY — DX: Other injury of unspecified body region, initial encounter: T14.8XXA

## 2021-11-12 LAB — BASIC METABOLIC PANEL
Anion gap: 8 (ref 5–15)
BUN: 22 mg/dL (ref 8–23)
CO2: 27 mmol/L (ref 22–32)
Calcium: 8.8 mg/dL — ABNORMAL LOW (ref 8.9–10.3)
Chloride: 107 mmol/L (ref 98–111)
Creatinine, Ser: 0.97 mg/dL (ref 0.44–1.00)
GFR, Estimated: >60 mL/min (ref >60)
Glucose, Bld: 116 mg/dL — ABNORMAL HIGH (ref 70–99)
Potassium: 4 mmol/L (ref 3.5–5.1)
Sodium: 142 mmol/L (ref 135–145)

## 2021-11-12 LAB — URINALYSIS, ROUTINE W REFLEX MICROSCOPIC
Bilirubin Urine: NEGATIVE
Glucose, UA: NEGATIVE mg/dL
Ketones, ur: NEGATIVE mg/dL
Nitrite: NEGATIVE
Protein, ur: NEGATIVE mg/dL
RBC / HPF: >50 RBC/hpf — ABNORMAL HIGH (ref 0–5)
Specific Gravity, Urine: 1.018 (ref 1.005–1.030)
pH: 5 (ref 5.0–8.0)

## 2021-11-12 LAB — CBC WITH DIFFERENTIAL/PLATELET
Abs Immature Granulocytes: 0.05 10*3/uL (ref 0.00–0.07)
Basophils Absolute: 0.1 10*3/uL (ref 0.0–0.1)
Basophils Relative: 1 %
Eosinophils Absolute: 0.1 10*3/uL (ref 0.0–0.5)
Eosinophils Relative: 2 %
HCT: 34.5 % — ABNORMAL LOW (ref 36.0–46.0)
Hemoglobin: 10.6 g/dL — ABNORMAL LOW (ref 12.0–15.0)
Immature Granulocytes: 1 %
Lymphocytes Relative: 9 %
Lymphs Abs: 0.6 10*3/uL — ABNORMAL LOW (ref 0.7–4.0)
MCH: 27.7 pg (ref 26.0–34.0)
MCHC: 30.7 g/dL (ref 30.0–36.0)
MCV: 90.3 fL (ref 80.0–100.0)
Monocytes Absolute: 0.8 10*3/uL (ref 0.1–1.0)
Monocytes Relative: 12 %
Neutro Abs: 4.8 10*3/uL (ref 1.7–7.7)
Neutrophils Relative %: 75 %
Platelets: 164 10*3/uL (ref 150–400)
RBC: 3.82 MIL/uL — ABNORMAL LOW (ref 3.87–5.11)
RDW: 17.8 % — ABNORMAL HIGH (ref 11.5–15.5)
WBC: 6.4 10*3/uL (ref 4.0–10.5)
nRBC: 0 % (ref 0.0–0.2)

## 2021-11-12 LAB — I-STAT CHEM 8, ED
BUN: 24 mg/dL — ABNORMAL HIGH (ref 8–23)
Calcium, Ion: 1.16 mmol/L (ref 1.15–1.40)
Chloride: 107 mmol/L (ref 98–111)
Creatinine, Ser: 1 mg/dL (ref 0.44–1.00)
Glucose, Bld: 111 mg/dL — ABNORMAL HIGH (ref 70–99)
HCT: 33 % — ABNORMAL LOW (ref 36.0–46.0)
Hemoglobin: 11.2 g/dL — ABNORMAL LOW (ref 12.0–15.0)
Potassium: 3.9 mmol/L (ref 3.5–5.1)
Sodium: 140 mmol/L (ref 135–145)
TCO2: 28 mmol/L (ref 22–32)

## 2021-11-12 LAB — RESP PANEL BY RT-PCR (FLU A&B, COVID) ARPGX2
Influenza A by PCR: NEGATIVE
Influenza B by PCR: NEGATIVE
SARS Coronavirus 2 by RT PCR: NEGATIVE

## 2021-11-12 LAB — PROTIME-INR
INR: 1.6 — ABNORMAL HIGH (ref 0.8–1.2)
Prothrombin Time: 19 seconds — ABNORMAL HIGH (ref 11.4–15.2)

## 2021-11-12 MED ORDER — MIDODRINE HCL 5 MG PO TABS
5.0000 mg | ORAL_TABLET | Freq: Once | ORAL | Status: AC
  Administered 2021-11-12: 5 mg via ORAL
  Filled 2021-11-12: qty 1

## 2021-11-12 MED ORDER — SODIUM CHLORIDE 0.9 % IV SOLN
1.0000 g | Freq: Once | INTRAVENOUS | Status: AC
Start: 2021-11-12 — End: 2021-11-12
  Administered 2021-11-12: 1 g via INTRAVENOUS
  Filled 2021-11-12: qty 10

## 2021-11-12 MED ORDER — SODIUM CHLORIDE 0.9 % IV BOLUS
500.0000 mL | Freq: Once | INTRAVENOUS | Status: AC
  Administered 2021-11-12: 500 mL via INTRAVENOUS

## 2021-11-12 MED ORDER — FENTANYL CITRATE PF 50 MCG/ML IJ SOSY
12.5000 ug | PREFILLED_SYRINGE | Freq: Once | INTRAMUSCULAR | Status: AC
Start: 2021-11-12 — End: 2021-11-12
  Administered 2021-11-12: 12.5 ug via INTRAVENOUS
  Filled 2021-11-12: qty 1

## 2021-11-12 NOTE — ED Triage Notes (Signed)
Pt bib [facility] from home with complaints of two falls today, Once this morning where she tripped and fell onto her right hip. The second fall today happened after she got dizzy and fell backwards and hit the back of her head. Upon EMS arrival, pt found to be hypotensive sitting up with 58/32 then laying down 90SBP. After 371ml NS- 130/62. Pt lives at home alone and arrives with a ccollar in place. AOx4. Pt reports medtronic pacemaker and no home meds taken this morning.

## 2021-11-12 NOTE — H&P (Signed)
History and Physical    Kayla Harrison QAS:[id number] DOB: Jun 12, 1946 DOA: 11/12/2021  PCP: [name], MD  Patient coming from: Home  HPI: Patient is a 76 year old female with a past medical history of persistent A-fib/flutter on Eliquis, heart block status post PPM, hypotension on midodrine, recurrent falls, hyperlipidemia, CVA, hypothyroidism, non-Hodgkin's lymphoma in remission presented to the ED via EMS complaining of headache and right hip/buttock pain after 2 falls today.  Upon EMS arrival, patient was found to be hypotensive with blood pressure 58/32 when sitting up and SBP 90 when laying down.  She was given a 350 cc fluid bolus after which blood pressure improved to 130/62.  In the ED, patient was hypotensive again with systolic as low as 79G.  Blood pressure improved after a 500 cc fluid bolus.  Not febrile or tachycardic.  Not hypoxic.  Labs showing no leukocytosis.  Hemoglobin 10.6, was 11.9 on 10/04/2021.  COVID and influenza PCR negative.  UA with negative nitrite, small amount of leukocytes and microscopy showing >50 RBCs, 11-20 WBCs, and many bacteria.  Urine culture pending.    Chest x-ray not suggestive of pneumonia, rib fractures, or pneumothorax.    Pelvic x-ray negative for acute finding.    CT head and C-spine negative for acute finding; showing small right-sided effusion in the upper right chest at the apex measures water density but is incompletely evaluated.  Previously noted to have a right-sided effusion in June 2022 that did not track to the level of the apex.  CT renal stone study showing marked volume of air within the bladder, including throughout the bladder walls, compatible with a severe emphysematous cystitis.  Showing a large right gluteal hematoma measuring approximately 15 x 9 x 11 cm.  Showing a small to moderate size right pleural effusion, incompletely imaged.  Hyperdense liver suspicious for hemochromatosis.  CT with additional views done: "No  recent fracture or dislocation is seen in the right hip. There are large hematomas in the right gluteal muscles and in the subcutaneous plane in the right gluteal region extending to the posterior upper right thigh. There is 9.9 x 7 cm hematoma in the right gluteus maximus. There is 10.2 x 5.6 cm subcutaneous hematoma posterior to the gluteal muscles. In addition, there is extensive subcutaneous edema posterior to the gluteal and subcutaneous hematomas suggesting contusion extending from the right gluteal region and along the posterior right upper thigh."  In the ED, patient became very lightheaded/near syncopal when staff attempted to perform orthostatic vital signs and sat her up at the edge of the bed.    Pacemaker interrogated and showing no arrhythmia or high rate episodes since last interrogation on 10/04/2021.  Patient was given fentanyl, midodrine, and ceftriaxone.  Patient states she had 2 falls at home today and injured her head and right hip/buttock.  States the first time she was caring a heavy laundry basket, lost her balance and fell on her right hip.  States she called the fire department who came and helped her sit up.  States later in the day as she was going back to the laundry room to put more laundry to the machine, she fell again and hit her head.  Patient does recall feeling lightheaded when she was walking to the laundry room and thinks she might have passed out.  She felt nauseous afterwards but did not vomit.  Endorsing dysuria.  Denies fevers, cough, shortness of breath, palpitations, chest pain, abdominal pain, or diarrhea.  Reports poor appetite  and does not drink enough fluids.  She takes Eliquis and her last dose was 24 hours ago (2/19 evening).  Reports history of chronically low blood pressure for which she takes midodrine.  Patient states her blood pressure can go as low as 80s over 40s.   Review of Systems:  All systems reviewed and apart from history of presenting  illness, are negative.  Past Medical History:  Diagnosis Date   Anemia    Arthritis    osteoarthritis - knees and right shoulder   Blood transfusion without reported diagnosis    Breast cancer ([place])    Dr Margot Chimes, total thyroidectomy- 1999- for cancer   Brucellosis 1964   Chronic bilateral pleural effusions    Colon polyp    Dr Earlean Shawl   Complication of anesthesia    Ketamine produces LSD reaction, bright colored nightmarish experience    Dyslipidemia    Endometriosis    Fibroids    H/O pleural effusion    s/p thoracentesis w 3256m withdrawn   Hematoma 11/12/2021   Hepatitis    [place] ?hepatitis    History of dysphagia    due to radiation therapy   History of hiatal hernia    small noted on PET scan   Hypertension    Hypothyroidism    [place]   radiation RX completed 03/04/17; will start chemo 6/27, pt unaware of lung cancer   Morbid obesity ([facility]    Status post lap band surgery   Nephrolithiasis    [place] Hod[place]kin's [place]    on chemotherapy   Persistent atrial fibrillation ([facility]    a. s/p PVI 2008 b. s/p convergent ablation 25638complicated by bradycardia requiring pacemaker implant   Personal history of radiation therapy    Presence of permanent cardiac pacemaker    Rotator cuff tear    Right   [place]   SVC syndrome    with lung mass and non hodgkins lymphoma   [place] cancer ([facility] [place]    Past Surgical History:  Procedure Laterality Date   ABDOMINAL HYSTERECTOMY  1983   afib ablation     a. 2008 PVI b. 2014 convergent ablation   APPENDECTOMY     BONE MARROW BIOPSY  02/21/2017   BREAST LUMPECTOMY Left 2010   bGarnavilloCATHETERIZATION     2015- negative   CARDIOVERSION  10/09/2012   Procedure: CARDIOVERSION;  Surgeon: JMinus Breeding MD;  [place]  Service: Cardiovascular;  Laterality: N/A;   CARDIOVERSION  10/09/2012   Procedure:  CARDIOVERSION;  Surgeon: JMinus Breeding MD;  [facility]  Service: Cardiovascular;  Laterality: N/A;  [name] the ok to add pt to the add on , but we must check to find out if the can add pt on at [phone] ( [phone])   CARDIOVERSION N/A 11/20/2012   Procedure: CARDIOVERSION;  Surgeon: PFay Records MD;  [place]  Service: Cardiovascular;  Laterality: N/A;   CARDIOVERSION N/A 07/18/2017   Procedure: CARDIOVERSION;  Surgeon: NThayer Headings MD;  [facility];  Service: Cardiovascular;  Laterality: N/A;   CARDIOVERSION N/A 10/03/2017   Procedure: CARDIOVERSION;  Surgeon: CSanda Klein MD;  [facility] [place];  Service: Cardiovascular;  Laterality: N/A;   CARDIOVERSION N/A 01/07/2018   Procedure: CARDIOVERSION;  Surgeon: Nahser, PWonda Cheng MD;  [facility];  Service: Cardiovascular;  Laterality: N/A;  CARDIOVERSION N/A 12/10/2019   Procedure: CARDIOVERSION;  Surgeon: Buford Dresser, MD;  Location: [facility] ENDOSCOPY;  Service: Cardiovascular;  Laterality: N/A;   CARDIOVERSION N/A 03/09/2021   Procedure: CARDIOVERSION;  Surgeon: Pixie Casino, MD;  Location: [place];  Service: Cardiovascular;  Laterality: N/A;   CARDIOVERSION N/A 10/04/2021   Procedure: CARDIOVERSION;  Surgeon: Jerline Pain, MD;  Location: [place];  Service: Cardiovascular;  Laterality: N/A;   CHOLECYSTECTOMY     COLONOSCOPY W/ POLYPECTOMY     Dr Earlean Shawl   CYSTOSCOPY N/A 02/06/2015   Procedure: CYSTOSCOPY;  Surgeon: Kathie Rhodes, MD;  [facility];  Service: Urology;  Laterality: N/A;   CYSTOSCOPY W/ RETROGRADES Left 11/17/2017   Procedure: CYSTOSCOPY WITH RETROGRADE /PYELOGRAM/;  Surgeon: Kathie Rhodes, MD;  [facility];  Service: Urology;  Laterality: Left;   CYSTOSCOPY WITH RETROGRADE PYELOGRAM, URETEROSCOPY AND STENT PLACEMENT Right 02/06/2015   Procedure: RETROGRADE PYELOGRAM, RIGHT URETEROSCOPY STENT PLACEMENT;  Surgeon: Kathie Rhodes, MD;  [facility];  Service: Urology;  Laterality: Right;   CYSTOSCOPY WITH RETROGRADE PYELOGRAM, URETEROSCOPY AND STENT PLACEMENT Right 03/07/2017   Procedure: CYSTOSCOPY WITH RIGHT RETROGRADE PYELOGRAM,RIGHT  URETEROSCOPYLASER LITHOTRIPSY  AND STENT PLACEMENT AND STONE BASKETRY;  Surgeon: Kathie Rhodes, MD;  Location: [place];  Service: Urology;  Laterality: Right;   EYE SURGERY     cataract surgery   fatty mass removal  1999   pubic area   HOLMIUM LASER APPLICATION N/A 4/70/9628   Procedure: HOLMIUM LASER APPLICATION;  Surgeon: Kathie Rhodes, MD;  [facility];  Service: Urology;  Laterality: N/A;   HOLMIUM LASER APPLICATION Right 3/66/2947   Procedure: HOLMIUM LASER APPLICATION;  Surgeon: Kathie Rhodes, MD;  [facility];  Service: Urology;  Laterality: Right;   HOLMIUM LASER APPLICATION Left 6/54/6503   Procedure: HOLMIUM LASER APPLICATION;  Surgeon: Kathie Rhodes, MD;  [facility];  Service: Urology;  Laterality: Left;   I & D EXTREMITY Left 12/19/2020   Procedure: IRRIGATION AND DEBRIDEMENT OF LEFT HIP HEMATOMA WITH APPLICATION OF WOUND VAC;  Surgeon: Erle Crocker, MD;  Location: [place];  Service: Orthopedics;  Laterality: Left;   I & D EXTREMITY Left 02/06/2021   Procedure: IRRIGATION AND DEBRIDEMENT DEEP ABCESS LEFT THIGH, SECONDARY CLOSURE OF WOUND DEHISCENCE;  Surgeon: Erle Crocker, MD;  Location: [place];  Service: Orthopedics;  Laterality: Left;   IR FLUORO GUIDE PORT INSERTION RIGHT  02/24/2017   IR NEPHROSTOMY PLACEMENT RIGHT  11/17/2017   IR PATIENT EVAL TECH 0-60 MINS  03/11/2017   IR REMOVAL TUN ACCESS W/ PORT W/O FL MOD SED  04/20/2018   IR US GUIDE VASC ACCESS RIGHT  02/24/2017   KNEE ARTHROSCOPY     bilateral   LAPAROSCOPIC GASTRIC BANDING  07/10/2010   LAPAROSCOPIC GASTRIC BANDING     Laparoscopic adjustable banding APS System with posterior hiatal hernia, 2 suture.   LAPAROTOMY     for ruptured ovary and ovarian artery     NEPHROLITHOTOMY Right 11/17/2017   Procedure: NEPHROLITHOTOMY PERCUTANEOUS;  Surgeon: Kathie Rhodes, MD;  [facility];  Service: Urology;  Laterality: Right;   PACEMAKER INSERTION  03/10/2013   MDT dual chamber PPM   POCKET REVISION N/A 12/08/2013   Procedure: POCKET REVISION;  Surgeon: Deboraha Sprang, MD;  Location: [facility] CATH LAB;  Service: Cardiovascular;  Laterality: N/A;   PORTA CATH INSERTION     REVERSE SHOULDER ARTHROPLASTY Right 05/14/2018   Procedure: RIGHT REVERSE SHOULDER ARTHROPLASTY;  Surgeon: Tania Ade, MD;  Location: [place];  Service: Orthopedics;  Laterality: Right;   REVERSE SHOULDER REPLACEMENT Right 05/14/2018   RIGHT HEART CATH N/A 07/21/2019   Procedure: RIGHT HEART CATH;  Surgeon: Larey Dresser, MD;  Location: [place] CV LAB;  Service: Cardiovascular;  Laterality: N/A;   TEE WITH CARDIOVERSION  09/22/2017   TEE WITHOUT CARDIOVERSION N/A 10/03/2017   Procedure: TRANSESOPHAGEAL ECHOCARDIOGRAM (TEE);  Surgeon: Sanda Klein, MD;  Location: [facility] ENDOSCOPY;  Service: Cardiovascular;  Laterality: N/A;   TEE WITHOUT CARDIOVERSION N/A 08/04/2019   Procedure: TRANSESOPHAGEAL ECHOCARDIOGRAM (TEE);  Surgeon: Larey Dresser, MD;  Location: [facility] ENDOSCOPY;  Service: Cardiovascular;  Laterality: N/A;   TEE WITHOUT CARDIOVERSION N/A 12/10/2019   Procedure: TRANSESOPHAGEAL ECHOCARDIOGRAM (TEE);  Surgeon: Buford Dresser, MD;  Location: [facility] END[facility]COPY;  Service: Cardiovascular;  Laterality: N/A;   TEE WITHOUT CARDIOVERSION N/A 03/09/2021   Procedure: TRANSESOPHAGEAL ECHOCARDIOGRAM (TEE);  Surgeon: Pixie Casino, MD;  Location: [facility] ENDOSCOPY;  Service: Cardiovascular;  Laterality: N/A;   TEE WITHOUT CARDIOVERSION N/A 10/04/2021   Procedure: TRANSESOPHAGEAL ECHOCARDIOGRAM (TEE);  Surgeon: Jerline Pain, MD;  Location: [facility] ENDOSCOPY;  Service: Cardiovascular;  Laterality: N/A;   THYROIDECTOMY  1998   Dr Margot Chimes   TONSILLECTOMY     TOTAL KNEE ARTHROPLASTY  04/13/2012    Procedure: TOTAL KNEE ARTHROPLASTY;  Surgeon: Rudean Haskell, MD;  Location: [place];  Service: Orthopedics;  Laterality: Right;   VIDEO BRONCHOSCOPY WITH ENDOBRONCHIAL ULTRASOUND N/A 02/07/2017   Procedure: VIDEO BRONCHOSCOPY WITH ENDOBRONCHIAL ULTRASOUND;  Surgeon: Marshell Garfinkel, MD;  Location: [place];  Service: Pulmonary;  Laterality: N/A;     reports that she has never smoked. She has never used smokeless tobacco. She reports that she does not drink alcohol and does not use drugs.  Allergies  Allergen Reactions   Ranolazine     Balance issues   Rivaroxaban     Nose Bleed X 6 hrs ; packing in ER Nasal hemorrhage   Tape Rash and Other (See Comments)    SKIN IS THIN AND TEARS EASILY (also does not tolerate paper tape well)     Tikosyn [Dofetilide] Other (See Comments)    "Long QT wave"    Epinephrine Other (See Comments)    Oral anesthetic Dental form only (liquid). Patient stated she will become "out of it" she can hear you but cannot respond in normal fashion. "not fully with it"   Ketamine Other (See Comments)    Hallucinations     Family History  Problem Relation Age of Onset   Heart disease Father 39       MI _0    Colon cancer Father        COLON   Heart attack Father    Other Mother        temporal arteritis    Diabetes Sister    Diabetes Brother    Diabetes Paternal Aunt    Diabetes Paternal Grandmother    Esophageal cancer Neg Hx    Inflammatory bowel disease Neg Hx    Liver disease Neg Hx    Pancreatic cancer Neg Hx    Stomach cancer Neg Hx     Prior to Admission medications   Medication Sig Start Date End Date Taking? Authorizing Provider  acetaminophen (TYLENOL) 325 MG tablet Take 2 tablets (650 mg total) by mouth every 4 (four) hours as needed for mild pain (or temp > 37.5 C (99.5 F)). 04/02/21  Yes Angiulli, Lavon Paganini, PA-C  amiodarone (  PACERONE) 200 MG tablet Take 1 tablet (200 mg total) by mouth in the morning and at bedtime. 03/09/21  Yes Hilty,  Nadean Corwin, MD  apixaban (ELIQUIS) 5 MG TABS tablet TAKE 1 TABLET BY MOUTH  TWICE DAILY 10/22/21  Yes Deboraha Sprang, MD  Calcium Carb-Cholecalciferol (CALCIUM + VITAMIN D3 PO) Take 1 tablet by mouth daily.   Yes [provider]  levothyroxine (SYNTHROID) 150 MCG tablet Take 150 mcg by mouth daily. 10/10/21  Yes [provider]  RESTASIS 0.05 % ophthalmic emulsion Place 1 drop into both eyes daily. 10/02/21  Yes [provider]  rosuvastatin (CRESTOR) 10 MG tablet Take 1 tablet (10 mg total) by mouth daily. Mondays & Thursdays Patient taking differently: Take 10 mg by mouth See admin instructions. Mondays & Thursdays 03/19/21  Yes Kathie Dike, MD  cephALEXin (KEFLEX) 500 MG capsule Take 1 capsule (500 mg total) by mouth in the morning and at bedtime. Patient not taking: Reported on 09/24/2021 08/24/21   Nicholas Lose, MD  midodrine (PROAMATINE) 5 MG tablet TAKE 1 TABLET BY MOUTH TWICE DAILY WITH A MEAL 08/28/21   Raulkar, Clide Deutscher, MD  senna-docusate (SENOKOT-S) 8.6-50 MG tablet Take 2 tablets by mouth 2 (two) times daily. Patient not taking: Reported on 11/12/2021 04/02/21   Cathlyn Parsons, PA-C    Physical Exam: Vitals:   11/12/21 1906 11/12/21 2000 11/12/21 2229 11/12/21 2235  BP: (!) 104/49 (!) 90/49 (!) 96/49 (!) 96/49  Pulse: 72 70 (!) 56 (!) 56  Resp: (!) _0 Temp:   98.4 F (36.9 C) 98.4 F (36.9 C)  TempSrc:   Oral Oral  SpO2: 96% 96% 98% 98%  Weight:      Height:        Physical Exam Constitutional:      General: She is not in acute distress. HENT:     Head: Normocephalic.  Eyes:     Extraocular Movements: Extraocular movements intact.     Conjunctiva/sclera: Conjunctivae normal.  Cardiovascular:     Rate and Rhythm: Normal rate and regular rhythm.     Pulses: Normal pulses.  Pulmonary:     Effort: Pulmonary effort is normal. No respiratory distress.     Breath sounds: No wheezing or rales.  Abdominal:     General: Bowel sounds  are normal. There is no distension.     Palpations: Abdomen is soft.     Tenderness: There is no abdominal tenderness.  Musculoskeletal:        General: No swelling or tenderness.     Cervical back: Normal range of motion and neck supple.     Comments: Large bruise noted on the right buttock  Skin:    General: Skin is warm and dry.  Neurological:     General: No focal deficit present.     Mental Status: She is alert and oriented to person, place, and time.     Labs on Admission: I have personally reviewed following labs and imaging studies  CBC: Recent Labs  Lab 11/12/21 1420 11/12/21 1432  WBC 6.4  --   NEUTROABS 4.8  --   HGB 10.6* 11.2*  HCT 34.5* 33.0*  MCV 90.3  --   PLT 164  --    Basic Metabolic Panel: Recent Labs  Lab 11/12/21 1420 11/12/21 1432  NA 142 140  K 4.0 3.9  CL 107 107  CO2 27  --   GLUCOSE 116* 111*  BUN 22 24*  CREATININE 0.97 1.00  CALCIUM 8.8*  --    GFR: Estimated Creatinine Clearance: 53.5 mL/min (by C-G formula based on SCr of 1 mg/dL). Liver Function Tests: No results for input(s): AST, ALT, ALKPHOS, BILITOT, PROT, ALBUMIN in the last 168 hours. No results for input(s): LIPASE, AMYLASE in the last 168 hours. No results for input(s): AMMONIA in the last 168 hours. Coagulation Profile: Recent Labs  Lab 11/12/21 1420  INR 1.6*   Cardiac Enzymes: No results for input(s): CKTOTAL, CKMB, CKMBINDEX, TROPONINI in the last 168 hours. BNP (last 3 results) No results for input(s): PROBNP in the last 8760 hours. HbA1C: No results for input(s): HGBA1C in the last 72 hours. CBG: No results for input(s): GLUCAP in the last 168 hours. Lipid Profile: No results for input(s): CHOL, HDL, LDLCALC, TRIG, CHOLHDL, LDLDIRECT in the last 72 hours. Thyroid Function Tests: No results for input(s): TSH, T4TOTAL, FREET4, T3FREE, THYROIDAB in the last 72 hours. Anemia Panel: No results for input(s): VITAMINB12, FOLATE, FERRITIN, TIBC, IRON, RETICCTPCT  in the last 72 hours. Urine analysis:    Component Value Date/Time   COLORURINE AMBER (A) 11/12/2021 1853   APPEARANCEUR CLOUDY (A) 11/12/2021 1853   LABSPEC 1.018 11/12/2021 1853   PHURINE 5.0 11/12/2021 1853   GLUCOSEU NEGATIVE 11/12/2021 1853   GLUCOSEU Color Interference (A) 11/15/2014 1401   HGBUR LARGE (A) 11/12/2021 1853   HGBUR large 04/29/2008 1308   BILIRUBINUR NEGATIVE 11/12/2021 1853   KETONESUR NEGATIVE 11/12/2021 1853   PROTEINUR NEGATIVE 11/12/2021 1853   UROBILINOGEN Color Interference (A) 11/15/2014 1401   NITRITE NEGATIVE 11/12/2021 1853   LEUKOCYTESUR SMALL (A) 11/12/2021 1853    Radiological Exams on Admission: I have personally reviewed images CT Head Wo Contrast  Result Date: 11/12/2021 CLINICAL DATA:  Head trauma, moderate-severe EXAM: CT HEAD WITHOUT CONTRAST TECHNIQUE: Contiguous axial images were obtained from the base of the skull through the vertex without intravenous contrast. RADIATION DOSE REDUCTION: This exam was performed according to the departmental dose-optimization program which includes automated exposure control, adjustment of the mA and/or kV according to patient size and/or use of iterative reconstruction technique. COMPARISON:  03/15/2021 FINDINGS: Brain: No evidence of acute infarction, hemorrhage, hydrocephalus, extra-axial collection or mass lesion/mass effect. Small focus of encephalomalacia within the left caudate head at site of previously seen hemorrhage. Moderate-advanced low-density changes within the periventricular and subcortical white matter compatible with chronic microvascular ischemic change. Mild diffuse cerebral volume loss. Vascular: Atherosclerotic calcifications involving the large vessels of the skull base. No unexpected hyperdense vessel. Skull: Normal. Negative for fracture or focal lesion. Sinuses/Orbits: No acute finding. Other: None. IMPRESSION: 1. No acute intracranial abnormality. 2. Chronic microvascular ischemic disease.  Electronically Signed   By: Davina Poke D.O.   On: 11/12/2021 16:25   CT Cervical Spine Wo Contrast  Result Date: 11/12/2021 CLINICAL DATA:  Neck trauma in a 76 year old female. EXAM: CT CERVICAL SPINE WITHOUT CONTRAST TECHNIQUE: Multidetector CT imaging of the cervical spine was performed without intravenous contrast. Multiplanar CT image reconstructions were also generated. RADIATION DOSE REDUCTION: This exam was performed according to the departmental dose-optimization program which includes automated exposure control, adjustment of the mA and/or kV according to patient size and/or use of iterative reconstruction technique. COMPARISON:  December 15, 2020. FINDINGS: Alignment: Normal. Skull base and vertebrae: No acute fracture. No primary bone lesion or focal pathologic process. Soft tissues and spinal canal: No prevertebral fluid or swelling. No visible canal hematoma. Disc levels: Multilevel degenerative changes with small to moderate anterior  osteophytes greatest at C5-6. Moderate to marked uncovertebral spurring with some neural foraminal narrowing at C5-6 and C6-7 greatest on the RIGHT. Upper chest: Small RIGHT-sided effusion in the upper RIGHT chest measures water density but is incompletely evaluated. Incidental imaging of upper chest is otherwise unremarkable. Other: None IMPRESSION: 1. No acute fracture or static subluxation of the cervical spine. 2. Multilevel degenerative changes with some neural foraminal narrowing at C5-6 and C6-7 greatest on the RIGHT. 3. Small RIGHT-sided effusion in the upper RIGHT chest at the apex measures water density but is incompletely evaluated. Previously noted to have a RIGHT-sided effusion in June of 2022 that did not track to the level of the apex. Electronically Signed   By: Zetta Bills M.D.   On: 11/12/2021 16:35   DG Pelvis Portable  Result Date: 11/12/2021 CLINICAL DATA:  Level 2 trauma, fall, pain EXAM: PORTABLE PELVIS 1-2 VIEWS COMPARISON:  12/15/2020  FINDINGS: Bones are osteopenic. Similar degenerative changes of the pelvis and both hips. No malalignment or acute osseous finding. Bony pelvis and hips appear symmetric and intact. Gastric banding hardware noted. Aortoiliac and femoral atherosclerosis present. IMPRESSION: No acute osseous finding. Electronically Signed   By: Jerilynn Mages.  Shick M.D.   On: 11/12/2021 14:26   DG Chest Portable 1 View  Result Date: 11/12/2021 CLINICAL DATA:  Level 2 trauma, fall EXAM: PORTABLE CHEST 1 VIEW COMPARISON:  12/15/2020 FINDINGS: Stable postoperative changes including left subclavian 2 lead pacer, left atrial appendage clip, and right shoulder reverse arthroplasty. Stable heart size and vascularity. Minor right base atelectasis or scarring. No focal pneumonia, collapse or consolidation. No large effusion or pneumothorax. Trachea midline. Aorta atherosclerotic. IMPRESSION: Stable chronic postoperative findings. Right basilar atelectasis versus scarring. Aortic Atherosclerosis (ICD10-I70.0). Electronically Signed   By: Jerilynn Mages.  Shick M.D.   On: 11/12/2021 14:24   CT Renal Stone Study  Result Date: 11/12/2021 CLINICAL DATA:  Nephrolithiasis. EXAM: CT ABDOMEN AND PELVIS WITHOUT CONTRAST TECHNIQUE: Multidetector CT imaging of the abdomen and pelvis was performed following the standard protocol without IV contrast. RADIATION DOSE REDUCTION: This exam was performed according to the departmental dose-optimization program which includes automated exposure control, adjustment of the mA and/or kV according to patient size and/or use of iterative reconstruction technique. COMPARISON:  CT abdomen dated 07/18/2020. FINDINGS: Lower chest: RIGHT pleural effusion, small to moderate in size, incompletely imaged. Hepatobiliary: Liver is hyperdense. No focal mass or lesion is seen within the liver. Status post cholecystectomy. No significant bile duct dilatation appreciated. Pancreas: Unremarkable. No pancreatic ductal dilatation or surrounding  inflammatory changes. Spleen: Unremarkable. Adrenals/Urinary Tract: Adrenal glands are unremarkable. Multiple LEFT renal stones, largest measuring 7 mm. No hydronephrosis. Several punctate RIGHT renal stones. No hydronephrosis. No ureteral or bladder calculi are identified. Marked amount of air within the bladder, including throughout the bladder walls, compatible with emphysematous cystitis. Stomach/Bowel: No dilated large or small bowel loops. No evidence of bowel wall inflammation. Lap band in place. Stomach otherwise unremarkable. Scattered diverticulosis of the descending and sigmoid colon but no focal inflammatory change to suggest acute diverticulitis. Vascular/Lymphatic: Aortic atherosclerosis. No abdominal aortic aneurysm. No enlarged lymph nodes seen in the abdomen or pelvis. Reproductive: Presumed hysterectomy. No adnexal mass or free fluid seen. Other: No free fluid or abscess collection is seen in the abdomen or pelvis. No free intraperitoneal air seen. Musculoskeletal: Degenerative spondylosis of the thoracolumbar spine, mild to moderate in degree. No acute-appearing osseous abnormality. Heterogeneously dense mass-like structure involving the RIGHT gluteus musculature, extending into the overlying subcutaneous soft  tissues, measuring approximately 15 x 9 x 11 cm, presumably a large hematoma, less likely abscess or neoplastic mass. IMPRESSION: 1. Marked amount of air within the bladder, including throughout the bladder walls, compatible with a severe emphysematous cystitis. 2. Very large mass-like structure involving the RIGHT gluteus musculature, extending into the overlying subcutaneous soft tissues, measuring approximately 15 x 9 x 11 cm, presumably a large hematoma, less likely abscess or neoplastic mass. Ordering physician states that patient has had a recent fall, therefore, this is consistent with a large hematoma. 3. Bilateral nephrolithiasis. No hydronephrosis. No ureteral or bladder calculi  are identified. 4. Colonic diverticulosis without evidence of acute diverticulitis. 5. RIGHT pleural effusion, small to moderate in size, incompletely imaged. 6. Liver is hyperdense suggesting hemochromatosis. These results were called by telephone at the time of interpretation on 11/12/2021 at 8:27 pm to provider [facility] , who verbally acknowledged these results. Electronically Signed   By: Franki Cabot M.D.   On: 11/12/2021 20:28   CT NO CHARGE  Result Date: 11/12/2021 CLINICAL DATA:  Trauma, fall, pain EXAM: CT ADDITIONAL VIEWS AT NO CHARGE TECHNIQUE: Axial, coronal and sagittal images of right hip were processed from the CT renal stone study. CONTRAST:  None COMPARISON:  None. FINDINGS: No recent fracture is seen in the right hip. There is no dislocation. There is large area of subcutaneous contusion/hematoma in the right gluteal region. There is 9.9 x 7 cm hematoma in the right gluteal muscles. There is another 10.2 x 5.6 cm hematoma in the subcutaneous plane. There is no significant effusion in the right hip joint. Extensive arterial calcifications are seen. There is possible peritoneal dialysis catheter in the lower abdomen. There is extensive pneumatosis in the bladder wall. There is air in the lumen of urinary bladder. Large amount of stool is seen in the rectosigmoid. IMPRESSION: No recent fracture or dislocation is seen in the right hip. There are large hematomas in the right gluteal muscles and in the subcutaneous plane in the right gluteal region extending to the posterior upper right thigh. There is 9.9 x 7 cm hematoma in the right gluteus maximus. There is 10.2 x 5.6 cm subcutaneous hematoma posterior to the gluteal muscles. In addition, there is extensive subcutaneous edema posterior to the gluteal and subcutaneous hematomas suggesting contusion extending from the right gluteal region and along the posterior right upper thigh. There is extensive pneumatosis in the urinary bladder wall.  There is air in the lumen of urinary bladder. These findings may suggest recent intervention or infectious process such as emphysematous cystitis. Electronically Signed   By: Elmer Picker M.D.   On: 11/12/2021 20:26    EKG: Personally reviewed.  Interpretation limited secondary to paced rhythm.  Assessment and Plan: * Orthostatic hypotension- (present on admission) Syncope Patient had 2 falls at home today, likely syncopal event and she endorses lightheadedness prior to the fall.  Found to be hypotensive with blood pressure as low as 58/32 with EMS when sitting up.  Also had an episode of lightheadedness/near syncope when staff set her up to check orthostatic vital signs.  Received a total of 850 cc IV fluids and blood pressure now improved with systolic in the 38H.  She has chronic hypotension for which she is on midodrine.  Dehydration also likely contributing given poor p.o. intake.  ACS less likely as she is not endorsing any chest pain.  Pacemaker interrogation revealed no arrhythmia or high rate episodes since last interrogation on 10/04/2021.  PE less  likely as she is not tachycardic or hypoxic. -Cardiac monitoring.  IV fluid hydration and monitor blood pressure closely.  Keep MAP >65.  Continue home midodrine.  Echocardiogram ordered.  Check troponin and D-dimer level.  Traumatic hematoma of buttock- (present on admission) Acute on chronic blood loss anemia CT showing large hematomas in the right gluteal muscles and in the subcutaneous plane in the right gluteal region extending to the posterior upper right thigh.  Hemoglobin currently 10.6, was 11.9 on 10/04/2021.  She is on Eliquis and last dose was 24 hours ago. -Hold Eliquis and monitor H&H every 6 hours.  Percocet as needed for pain, Tylenol as needed.  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  Patient is endorsing dysuria.  No fever or leukocytosis.  UA with  negative nitrite, small amount of leukocytes and microscopy showing >50 RBCs, 11-20 WBCs, and many bacteria. -Continue ceftriaxone, urine culture pending  Pleural effusion- (present on admission) CT renal stone study showing small to moderate size right pleural effusion, incompletely imaged.  Patient is not hypoxic. -Check BNP.  Will need dedicated CT chest for further evaluation which can be done in the morning, currently very orthostatic.  Abnormal finding on imaging of liver- (present on admission) CT showing hyperdense liver suspicious for hemochromatosis. -Will need outpatient iron studies for further evaluation.  Fall- (present on admission) -PT/OT eval, fall precautions.  TOC consulted for placement.   Persistent atrial fibrillation ([place])- (present on admission) Stable. -Hold Eliquis given large gluteal hematoma.  Continue amiodarone.  Hypothyroidism- (present on admission) -Continue Synthroid  HYPERLIPIDEMIA- (present on admission) -Hold statin at this time   DVT prophylaxis: SCDs Code Status: Full Code (discussed with the patient) Family Communication: No family available at this time. Level of care: Telemetry bed Admission status: It is my clinical opinion that admission to INPATIENT is reasonable and necessary because of the expectation that this patient will require hospital care that crosses at least 2 midnights to treat this condition based on the medical complexity of the problems presented.  Given the aforementioned information, the predictability of an adverse outcome is felt to be significant.   Shela Leff, MD [facility] 11/13/2021, 12:13 AM

## 2021-11-12 NOTE — ED Notes (Signed)
Attempted orthostatic vitals on pt but when sitting on the side of the bed pt reported dizziness and feeling faint. Pt notified to sit still for a minute and then pt lost balance and said she passed out. Pt never fell out of bed or lost consciousness. Pt placed back in bed and MD made aware.

## 2021-11-12 NOTE — ED Notes (Signed)
Pt medtronic pacer interrogated by primary RN

## 2021-11-12 NOTE — Progress Notes (Signed)
°   11/12/21 1350  Clinical Encounter Type  Visited With Patient;Health care provider  Visit Type Trauma  Referral From Other (Comment) (Level 2 Trauma)  Consult/Referral To Chaplain  Recommendations Make contacts aware of patient's situiation   Chaplain spoke with patient who was being checked by medical staff. When asked if there was someone we could contact, patient indicated  her sister, Tommie Raymond but could not provide a number.   Chaplain left message with sister Gustavus Messing who serves as the [place] [phone]). Also spoke with friend, Caroleen Hamman [phone] who indicated that she was very concerned that patient lived alone and had communicated concerns on Friday, 11/09/2021. She advised that a Education officer, museum to discuss concerns. NOTE:   Mrs. Tamala Julian also asked that the nurse call her back to provide more information on patient's condition.   Melody Haver, Resident Chaplain [phone]

## 2021-11-12 NOTE — Assessment & Plan Note (Addendum)
Acute on chronic blood loss anemia CT showing large hematomas in the right gluteal muscles and in the subcutaneous plane in the right gluteal region extending to the posterior upper right thigh.  Hemoglobin currently 10.6, was 11.9 on 10/04/2021.  She is on Eliquis and last dose was 24 hours ago. -Hold Eliquis and monitor H&H every 6 hours.  Percocet as needed for pain, Tylenol as needed.

## 2021-11-12 NOTE — ED Provider Notes (Signed)
[facility] EMERGENCY DEPARTMENT Provider Note   CSN: [id number] Arrival date & time: 11/12/21  1359     History  Chief Complaint  Patient presents with   Kayla Harrison    Kayla Harrison is a 76 y.o. female with a past medical history of hypertension, CVA 2022, multiple falls, A-fib on Eliquis, pacemaker status, thyroid cancer, breast cancer and mediastinal mass presenting after 2 falls that occurred earlier today.  She reports that she was trying to pick up a heavy load of laundry and fell onto her right hip.  She got up and tried to pick it up again and fell down, hitting her posterior school.  She pressed her life alert button to have EMS transfer her to the hospital.  She is unsure whether or not she lost consciousness however is complaining about a headache and right hip/buttock pain.  She is on Eliquis for A-fib.  No history of seizures however endorses multiple syncopal episodes.    Home Medications Prior to Admission medications   Medication Sig Start Date End Date Taking? Authorizing Provider  acetaminophen (TYLENOL) 325 MG tablet Take 2 tablets (650 mg total) by mouth every 4 (four) hours as needed for mild pain (or temp > 37.5 C (99.5 F)). 04/02/21   Angiulli, Lavon Paganini, PA-C  amiodarone (PACERONE) 200 MG tablet Take 1 tablet (200 mg total) by mouth in the morning and at bedtime. 03/09/21   Hilty, Nadean Corwin, MD  apixaban (ELIQUIS) 5 MG TABS tablet TAKE 1 TABLET BY MOUTH  TWICE DAILY 10/22/21   Deboraha Sprang, MD  Calcium Carb-Cholecalciferol (CALCIUM + VITAMIN D3 PO) Take 1 tablet by mouth daily.    [provider]  cephALEXin (KEFLEX) 500 MG capsule Take 1 capsule (500 mg total) by mouth in the morning and at bedtime. Patient not taking: Reported on 09/24/2021 08/24/21   Nicholas Lose, MD  levothyroxine (SYNTHROID) 137 MCG tablet Take 68.5-137 mcg by mouth See admin instructions. Taking 137 mcg tablet daily except on Saturday taking 1/2 tablet    [provider]  Lifitegrast Shirley Friar) 5 % SOLN Place 1 drop into both eyes in the morning and at bedtime.    [provider]  midodrine (PROAMATINE) 5 MG tablet TAKE 1 TABLET BY MOUTH TWICE DAILY WITH A MEAL 08/28/21   Raulkar, Clide Deutscher, MD  rosuvastatin (CRESTOR) 10 MG tablet Take 1 tablet (10 mg total) by mouth daily. Mondays & Thursdays Patient taking differently: Take 10 mg by mouth See admin instructions. Mondays & Thursdays 03/19/21   Kathie Dike, MD  senna-docusate (SENOKOT-S) 8.6-50 MG tablet Take 2 tablets by mouth 2 (two) times daily. Patient taking differently: Take 2 tablets by mouth daily as needed for moderate constipation. 04/02/21   Angiulli, Lavon Paganini, PA-C      Allergies    Ranolazine, Rivaroxaban, Tape, Tikosyn [dofetilide], Epinephrine, and Ketamine    Review of Systems   Review of Systems See HPI  Physical Exam Updated Vital Signs Ht 5\' 6"  (1.676 m)    Wt 90.7 kg    BMI 32.28 kg/m  Physical Exam Vitals and nursing note reviewed.  Constitutional:      General: She is not in acute distress.    Appearance: Normal appearance. She is not ill-appearing.  HENT:     Head: Normocephalic.     Comments: Patient with quarter size hematoma to the posterior skull.  No signs of lacerations/abrasions     Mouth/Throat:     Mouth:  Mucous membranes are dry.     Pharynx: Oropharynx is clear.  Eyes:     General: No scleral icterus.    Extraocular Movements: Extraocular movements intact.     Conjunctiva/sclera: Conjunctivae normal.     Pupils: Pupils are equal, round, and reactive to light.  Cardiovascular:     Rate and Rhythm: Normal rate.  Pulmonary:     Effort: Pulmonary effort is normal. No respiratory distress.     Breath sounds: No wheezing.  Abdominal:     General: Abdomen is flat.     Palpations: Abdomen is soft.     Tenderness: There is no abdominal tenderness.  Musculoskeletal:        General: Tenderness (Posterior right hip) present. No deformity.      Right lower leg: No edema.     Left lower leg: No edema.     Comments: Patient with full range of motion of the left lower extremity.  Passive range of motion intact in right hip however patient with difficulty flexing and abducting and adducting right him.  2+ DP pulses bilaterally.  Full range of motion of upper extremities.  5 out of 5 strength in upper extremities, 4 out of 5 strength to plantarflexion bilaterally  Skin:    General: Skin is warm.     Findings: Bruising (Bruising along anterior right thigh, appears old) present. No rash.  Neurological:     Mental Status: She is alert.     Sensory: No sensory deficit.  Psychiatric:        Mood and Affect: Mood normal.    ED Results / Procedures / Treatments   Labs (all labs ordered are listed, but only abnormal results are displayed) Labs Reviewed  CBC WITH DIFFERENTIAL/PLATELET - Abnormal; Notable for the following components:      Result Value   RBC 3.82 (*)    Hemoglobin 10.6 (*)    HCT 34.5 (*)    RDW 17.8 (*)    Lymphs Abs 0.6 (*)    All other components within normal limits  PROTIME-INR - Abnormal; Notable for the following components:   Prothrombin Time 19.0 (*)    INR 1.6 (*)    All other components within normal limits  I-STAT CHEM 8, ED - Abnormal; Notable for the following components:   BUN 24 (*)    Glucose, Bld 111 (*)    Hemoglobin 11.2 (*)    HCT 33.0 (*)    All other components within normal limits  RESP PANEL BY RT-PCR (FLU A&B, COVID) ARPGX2  BASIC METABOLIC PANEL  URINALYSIS, ROUTINE W REFLEX MICROSCOPIC    EKG None  Radiology DG Pelvis Portable  Result Date: 11/12/2021 CLINICAL DATA:  Level 2 trauma, fall, pain EXAM: PORTABLE PELVIS 1-2 VIEWS COMPARISON:  12/15/2020 FINDINGS: Bones are osteopenic. Similar degenerative changes of the pelvis and both hips. No malalignment or acute osseous finding. Bony pelvis and hips appear symmetric and intact. Gastric banding hardware noted. Aortoiliac and femoral  atherosclerosis present. IMPRESSION: No acute osseous finding. Electronically Signed   By: Jerilynn Mages.  Shick M.D.   On: 11/12/2021 14:26   DG Chest Portable 1 View  Result Date: 11/12/2021 CLINICAL DATA:  Level 2 trauma, fall EXAM: PORTABLE CHEST 1 VIEW COMPARISON:  12/15/2020 FINDINGS: Stable postoperative changes including left subclavian 2 lead pacer, left atrial appendage clip, and right shoulder reverse arthroplasty. Stable heart size and vascularity. Minor right base atelectasis or scarring. No focal pneumonia, collapse or consolidation. No large effusion or pneumothorax.  Trachea midline. Aorta atherosclerotic. IMPRESSION: Stable chronic postoperative findings. Right basilar atelectasis versus scarring. Aortic Atherosclerosis (ICD10-I70.0). Electronically Signed   By: Jerilynn Mages.  Shick M.D.   On: 11/12/2021 14:24    Procedures Procedures   Medications Ordered in ED Medications  sodium chloride 0.9 % bolus 500 mL (0 mLs Intravenous Stopped 11/12/21 1511)  fentaNYL (SUBLIMAZE) injection 12.5 mcg (12.5 mcg Intravenous Given 11/12/21 1505)    ED Course/ Medical Decision Making/ A&P                           Medical Decision Making Amount and/or Complexity of Data Reviewed Labs: ordered. Radiology: ordered.  Risk Prescription drug management.   76 year old female presenting as a level 2 trauma due to a fall on blood thinners.  She is alert and oriented. Also seen briefly by my attending MD [facility]  Imaging: Pelvic x-ray, chest x-ray and CT imaging ordered by me.  I reviewed patient's portable x-rays and saw no abnormalities in her bilateral hips or pelvis.  Chest x-ray without obvious rib fractures or pneumothorax.  Radiologist's read was nonconcerning.  Treatment: Patient was given fentanyl for pain control as well as IVF for hypotension.  She reports that she always has low blood pressure and that nobody should be concerned about pressures in the 80s over 40s.  We discussed that that is an unstable  blood pressure, especially in the context of her falls.  She understands and is agreeable to further evaluation.  CT imaging of head and neck are pending at shift change.  Patient signed out to [place].  Please see his note for results and ultimate disposition.  Final Clinical Impression(s) / ED Diagnoses Final diagnoses:  Trauma    Rx / DC Orders CT ordering has not been performed at this time.  Patient signed out to [employer], [place].  Please see his note for further results and ultimate disposition.  I suspect patient will need hospitalization if she is unable to ambulate or has positive orthostatics.   Darliss Ridgel 11/12/21 1544    Blanchie Dessert, MD 11/12/21 1954

## 2021-11-12 NOTE — ED Notes (Signed)
CT called

## 2021-11-12 NOTE — Subjective & Objective (Addendum)
Patient is a 76 year old female with a past medical history of persistent A-fib/flutter on Eliquis, heart block status post PPM, hypotension on midodrine, recurrent falls, hyperlipidemia, CVA, hypothyroidism, non-Hodgkin's lymphoma in remission presented to the ED via EMS complaining of headache and right hip/buttock pain after 2 falls today.  Upon EMS arrival, patient was found to be hypotensive with blood pressure 58/32 when sitting up and SBP 90 when laying down.  She was given a 350 cc fluid bolus after which blood pressure improved to 130/62.  In the ED, patient was hypotensive again with systolic as low as 88C.  Blood pressure improved after a 500 cc fluid bolus.  Not febrile or tachycardic.  Not hypoxic.  Labs showing no leukocytosis.  Hemoglobin 10.6, was 11.9 on 10/04/2021.  COVID and influenza PCR negative.  UA with negative nitrite, small amount of leukocytes and microscopy showing >50 RBCs, 11-20 WBCs, and many bacteria.  Urine culture pending.    Chest x-ray not suggestive of pneumonia, rib fractures, or pneumothorax.    Pelvic x-ray negative for acute finding.    CT head and C-spine negative for acute finding; showing small right-sided effusion in the upper right chest at the apex measures water density but is incompletely evaluated.  Previously noted to have a right-sided effusion in June 2022 that did not track to the level of the apex.  CT renal stone study showing marked volume of air within the bladder, including throughout the bladder walls, compatible with a severe emphysematous cystitis.  Showing a large right gluteal hematoma measuring approximately 15 x 9 x 11 cm.  Showing a small to moderate size right pleural effusion, incompletely imaged.  Hyperdense liver suspicious for hemochromatosis.  CT with additional views done: "No recent fracture or dislocation is seen in the right hip. There are large hematomas in the right gluteal muscles and in the subcutaneous plane in the right  gluteal region extending to the posterior upper right thigh. There is 9.9 x 7 cm hematoma in the right gluteus maximus. There is 10.2 x 5.6 cm subcutaneous hematoma posterior to the gluteal muscles. In addition, there is extensive subcutaneous edema posterior to the gluteal and subcutaneous hematomas suggesting contusion extending from the right gluteal region and along the posterior right upper thigh."  In the ED, patient became very lightheaded/near syncopal when staff attempted to perform orthostatic vital signs and sat her up at the edge of the bed.    Pacemaker interrogated and showing no arrhythmia or high rate episodes since last interrogation on 10/04/2021.  Patient was given fentanyl, midodrine, and ceftriaxone.  Patient states she had 2 falls at home today and injured her head and right hip/buttock.  States the first time she was caring a heavy laundry basket, lost her balance and fell on her right hip.  States she called the fire department who came and helped her sit up.  States later in the day as she was going back to the laundry room to put more laundry to the machine, she fell again and hit her head.  Patient does recall feeling lightheaded when she was walking to the laundry room and thinks she might have passed out.  She felt nauseous afterwards but did not vomit.  Endorsing dysuria.  Denies fevers, cough, shortness of breath, palpitations, chest pain, abdominal pain, or diarrhea.  Reports poor appetite and does not drink enough fluids.  She takes Eliquis and her last dose was 24 hours ago (2/19 evening).  Reports history of chronically low  blood pressure for which she takes midodrine.  Patient states her blood pressure can go as low as 80s over 40s.

## 2021-11-12 NOTE — ED Notes (Signed)
Trauma Response Nurse Documentation   Kayla Harrison is a 76 y.o. female arriving to [facility] via [facility]  On Eliquis (apixaban) daily. Trauma was activated as a Level 2 by charge nurse based on the following trauma criteria Elderly patients > 65 with head trauma on anti-coagulation (excluding ASA). Trauma team at the bedside on patient arrival. Patient cleared for CT by Dr. Maryan Rued. Patient to CT with team. GCS 15.  History   Past Medical History:  Diagnosis Date   Anemia    Arthritis    osteoarthritis - knees and right shoulder   Blood transfusion without reported diagnosis    Breast cancer ([place])    Dr Margot Chimes, total thyroidectomy- 1999- for cancer   Brucellosis 1964   Chronic bilateral pleural effusions    Colon polyp    Dr Earlean Shawl   Complication of anesthesia    Ketamine produces LSD reaction, bright colored nightmarish experience    Dyslipidemia    Endometriosis    Fibroids    H/O pleural effusion    s/p thoracentesis w 3223m withdrawn   Hepatitis    [place] ?hepatitis    History of dysphagia    due to radiation therapy   History of hiatal hernia    small noted on PET scan   Hypertension    Hypothyroidism    Lung cancer, lower lobe ([place] 01[place]   radiation RX completed 03/04/17; will start chemo 6/27, pt unaware of lung cancer   Morbid obesity ([place]    Status post lap band surgery   Nephrolithiasis    Non Hodgkin's lymphoma ([place]    on chemotherapy   Persistent atrial fibrillation ([place]    a. s/p PVI 2008 b. s/p convergent ablation 22353complicated by bradycardia requiring pacemaker implant   Personal history of radiation therapy    Presence of permanent cardiac pacemaker    Rotator cuff tear    Right   [place] ([place]    [place]   SVC syndrome    with lung mass and non hodgkins lymphoma   Thyroid cancer ([place] 2000     Past Surgical History:  Procedure Laterality Date   ABDOMINAL HYSTERECTOMY   1983   afib ablation     a. 2008 PVI b. 2014 convergent ablation   APPENDECTOMY     BONE MARROW BIOPSY  02/21/2017   BREAST LUMPECTOMY Left 2010   [place]    2015- negative   CARDIOVERSION  10/09/2012   Procedure: CARDIOVERSION;  Surgeon: JMinus Breeding MD;  [place]  Service: Cardiovascular;  Laterality: N/A;   CARDIOVERSION  10/09/2012   Procedure: CARDIOVERSION;  Surgeon: JMinus Breeding MD;  [place]  Service: Cardiovascular;  Laterality: N/A;  [name] the ok to add pt to the add on , but we must check to find out if the can add pt on at 1400 ([phone]   CARDIOVERSION N/A 11/20/2012   Procedure: CARDIOVERSION;  Surgeon: PFay Records MD;  [place]  Service: Cardiovascular;  Laterality: N/A;   CARDIOVERSION N/A 07/18/2017   Procedure: CARDIOVERSION;  Surgeon: NThayer Headings MD;  [place]  Service: Cardiovascular;  Laterality: N/A;   CARDIOVERSION N/A 10/03/2017   Procedure: CARDIOVERSION;  Surgeon: CSanda Klein MD;  [place] [facility];  Service: Cardiovascular;  Laterality: N/A;   CARDIOVERSION N/A 01/07/2018   Procedure: CARDIOVERSION;  Surgeon: NThayer Headings  MD;  Location: [place];  Service: Cardiovascular;  Laterality: N/A;   CARDIOVERSION N/A 12/10/2019   Procedure: CARDIOVERSION;  Surgeon: Buford Dresser, MD;  Location: [facility] ENDOSCOPY;  Service: Cardiovascular;  Laterality: N/A;   CARDIOVERSION N/A 03/09/2021   Procedure: CARDIOVERSION;  Surgeon: Pixie Casino, MD;  Location: [place];  Service: Cardiovascular;  Laterality: N/A;   CARDIOVERSION N/A 10/04/2021   Procedure: CARDIOVERSION;  Surgeon: Jerline Pain, MD;  Location: [place];  Service: Cardiovascular;  Laterality: N/A;   CHOLECYSTECTOMY     COLONOSCOPY W/ POLYPECTOMY     Dr Earlean Shawl   CYSTOSCOPY N/A 02/06/2015   Procedure: CYSTOSCOPY;  Surgeon: Kathie Rhodes, MD;  [facility];  Service: Urology;   Laterality: N/A;   CYSTOSCOPY W/ RETROGRADES Left 11/17/2017   Procedure: CYSTOSCOPY WITH RETROGRADE /PYELOGRAM/;  Surgeon: Kathie Rhodes, MD;  [facility];  Service: Urology;  Laterality: Left;   CYSTOSCOPY WITH RETROGRADE PYELOGRAM, URETEROSCOPY AND STENT PLACEMENT Right 02/06/2015   Procedure: RETROGRADE PYELOGRAM, RIGHT URETEROSCOPY STENT PLACEMENT;  Surgeon: Kathie Rhodes, MD;  [facility];  Service: Urology;  Laterality: Right;   CYSTOSCOPY WITH RETROGRADE PYELOGRAM, URETEROSCOPY AND STENT PLACEMENT Right 03/07/2017   Procedure: CYSTOSCOPY WITH RIGHT RETROGRADE PYELOGRAM,RIGHT  URETEROSCOPYLASER LITHOTRIPSY  AND STENT PLACEMENT AND STONE BASKETRY;  Surgeon: Kathie Rhodes, MD;  Location: [place];  Service: Urology;  Laterality: Right;   EYE SURGERY     cataract surgery   fatty mass removal  1999   pubic area   HOLMIUM LASER APPLICATION N/A 1/47/8295   Procedure: HOLMIUM LASER APPLICATION;  Surgeon: Kathie Rhodes, MD;  [facility];  Service: Urology;  Laterality: N/A;   HOLMIUM LASER APPLICATION Right 03/13/3085   Procedure: HOLMIUM LASER APPLICATION;  Surgeon: Kathie Rhodes, MD;  Location[facility];  Service: Urology;  Laterality: Right;   HOLMIUM LASER APPLICATION Left 5/78/4696   Procedure: HOLMIUM LASER APPLICATION;  Surgeon: Kathie Rhodes, MD;  [facility];  Service: Urology;  Laterality: Left;   I & D EXTREMITY Left 12/19/2020   Procedure: IRRIGATION AND DEBRIDEMENT OF LEFT HIP HEMATOMA WITH APPLICATION OF WOUND VAC;  Surgeon: Erle Crocker, MD;  Location: [place];  Service: Orthopedics;  Laterality: Left;   I & D EXTREMITY Left 02/06/2021   Procedure: IRRIGATION AND DEBRIDEMENT DEEP ABCESS LEFT THIGH, SECONDARY CLOSURE OF WOUND DEHISCENCE;  Surgeon: Erle Crocker, MD;  Location: [facility] [place];  Service: Orthopedics;  Laterality: Left;   IR FLUORO GUIDE PORT INSERTION RIGHT  02/24/2017   IR NEPHROSTOMY PLACEMENT RIGHT  11/17/2017   IR  PATIENT EVAL TECH 0-60 MINS  03/11/2017   IR REMOVAL TUN ACCESS W/ PORT W/O FL MOD SED  04/20/2018   IR US GUIDE VASC ACCESS RIGHT  02/24/2017   KNEE ARTHROSCOPY     bilateral   LAPAROSCOPIC GASTRIC BANDING  07/10/2010   LAPAROSCOPIC GASTRIC BANDING     Laparoscopic adjustable banding APS System with posterior hiatal hernia, 2 suture.   LAPAROTOMY     for ruptured ovary and ovarian artery    NEPHROLITHOTOMY Right 11/17/2017   Procedure: NEPHROLITHOTOMY PERCUTANEOUS;  Surgeon: Kathie Rhodes, MD;  [facility];  Service: Urology;  Laterality: Right;   PACEMAKER INSERTION  03/10/2013   MDT dual chamber PPM   POCKET REVISION N/A 12/08/2013   Procedure: POCKET REVISION;  Surgeon: Deboraha Sprang, MD;  Location: [facility] CATH LAB;  Service: Cardiovascular;  Laterality: N/A;   PORTA CATH INSERTION  REVERSE SHOULDER ARTHROPLASTY Right 05/14/2018   Procedure: RIGHT REVERSE SHOULDER ARTHROPLASTY;  Surgeon: Tania Ade, MD;  Location: [place];  Service: Orthopedics;  Laterality: Right;   REVERSE SHOULDER REPLACEMENT Right 05/14/2018   RIGHT HEART CATH N/A 07/21/2019   Procedure: RIGHT HEART CATH;  Surgeon: Larey Dresser, MD;  Location: [place] CV LAB;  Service: Cardiovascular;  Laterality: N/A;   TEE WITH CARDIOVERSION  09/22/2017   TEE WITHOUT CARDIOVERSION N/A 10/03/2017   Procedure: TRANSESOPHAGEAL ECHOCARDIOGRAM (TEE);  Surgeon: Sanda Klein, MD;  Location: [facility] ENDOSCOPY;  Service: Cardiovascular;  Laterality: N/A;   TEE WITHOUT CARDIOVERSION N/A 08/04/2019   Procedure: TRANSESOPHAGEAL ECHOCARDIOGRAM (TEE);  Surgeon: Larey Dresser, MD;  Location: [facility] ENDOSCOPY;  Service: Cardiovascular;  Laterality: N/A;   TEE WITHOUT CARDIOVERSION N/A 12/10/2019   Procedure: TRANSESOPHAGEAL ECHOCARDIOGRAM (TEE);  Surgeon: Buford Dresser, MD;  Location: [facility] ENDOSCOPY;  Service: Cardiovascular;  Laterality: N/A;   TEE WITHOUT CARDIOVERSION N/A 03/09/2021   Procedure: TRANSESOPHAGEAL ECHOCARDIOGRAM  (TEE);  Surgeon: Pixie Casino, MD;  Location: [facility] ENDOSCOPY;  Service: Cardiovascular;  Laterality: N/A;   TEE WITHOUT CARDIOVERSION N/A 10/04/2021   Procedure: TRANSESOPHAGEAL ECHOCARDIOGRAM (TEE);  Surgeon: Jerline Pain, MD;  Location: [facility] ENDOSCOPY;  Service: Cardiovascular;  Laterality: N/A;   THYROIDECTOMY  1998   Dr Margot Chimes   TONSILLECTOMY     TOTAL KNEE ARTHROPLASTY  04/13/2012   Procedure: TOTAL KNEE ARTHROPLASTY;  Surgeon: Rudean Haskell, MD;  Location: [place];  Service: Orthopedics;  Laterality: Right;   VIDEO BRONCHOSCOPY WITH ENDOBRONCHIAL ULTRASOUND N/A 02/07/2017   Procedure: VIDEO BRONCHOSCOPY WITH ENDOBRONCHIAL ULTRASOUND;  Surgeon: Marshell Garfinkel, MD;  Location: [place];  Service: Pulmonary;  Laterality: N/A;       Initial Focused Assessment (If applicable, or please see trauma documentation):   EMS called to home because pt fell and was unable to get up, she was able to call 911. States she has been falling frequently, has bruises of various healing stages on legs. Does have a laceration to back of head and is complaining of pain on the top of her head.  No other injuries noted. Does c/o right hip pain, states "I have it allthe time but it is worse now" Asking staff to "adjust legs, move them for me" encouraged pt to move herself to maintain independence, since she does live alone- Friend spoke with chaplain on the phone expressing concern of pt's ability to care for herself - lives alone and frequent falls./    CT's Completed:   CT Head and CT C-Spine   Interventions:  Labs Xrays Ct Scans IV fluids Pain control  Event Summary:  Pt's BP has been labile, dropping into the 58K and 99I systolic- has hx of same- takes mididrine at home, but did not take it this am. Also has not been drinking fluids. IV NS started- running wide open x 1 L.  {T is requesting more pain meds- does state that she is not anxious or takes pain meds regularly. TRN explained that she would be  unable to have any narcotics until her BP was more stable.       Lezlie Octave Braxdon Gappa  Trauma Response RN  Please call TRN at [phone] for further assistance.

## 2021-11-12 NOTE — Assessment & Plan Note (Signed)
CT showing findings consistent with severe emphysematous cystitis.  Patient is endorsing dysuria.  No fever or leukocytosis.  UA with negative nitrite, small amount of leukocytes and microscopy showing >50RBCs, 11-20 WBCs, and many bacteria. -Continue ceftriaxone, urine culture pending

## 2021-11-12 NOTE — Assessment & Plan Note (Signed)
-  PT/OT eval, fall precautions.  TOC consulted for placement.

## 2021-11-12 NOTE — Assessment & Plan Note (Addendum)
Syncope Patient had 2 falls at home today, likely syncopal event and she endorses lightheadedness prior to the fall.  Found to be hypotensive with blood pressure as low as 58/32 with EMS when sitting up.  Also had an episode of lightheadedness/near syncope when staff set her up to check orthostatic vital signs.  Received a total of 850 cc IV fluids and blood pressure now improved with systolic in the 37C.  She has chronic hypotension for which she is on midodrine.  Dehydration also likely contributing given poor p.o. intake.  ACS less likely as she is not endorsing any chest pain.  Pacemaker interrogation revealed no arrhythmia or high rate episodes since last interrogation on 10/04/2021.  PE less likely as she is not tachycardic or hypoxic. -Cardiac monitoring.  IV fluid hydration and monitor blood pressure closely.  Keep MAP >65.  Continue home midodrine.  Echocardiogram ordered.  Check troponin and D-dimer level.

## 2021-11-12 NOTE — Clinical Note (Incomplete)
ED RNCM met with patient at bedside to discuss [facility] recommendations vs SNF.  Discussed options with patient who adamantly refuses SNF  placement.  Patient was recently receiving OP Rehab in which she requested to be discharged although goals were not metPatient goals are to go home with [facility] services once pain decreases. ,

## 2021-11-12 NOTE — Progress Notes (Signed)
Orthopedic Tech Progress Note Patient Details:  Kayla Harrison [birth year] [id number]  Level 2 trauma   Patient ID: Kayla Harrison, female   DOB: 05/23/1946, 76 y.o.   MRN: [id number]  Kayla Harrison 11/12/2021, 2:20 PM

## 2021-11-12 NOTE — ED Provider Notes (Signed)
Signout from [place] at shift change. Briefly, patient presents for fall and head injury on blood thinner Eliquis.  She has a pacemaker.  Has a history of hypotension on midodrine.  She has not taken her medicine this morning.   Plan: Awaiting results of CT imaging.  Patient receiving some IV fluids.  We will give dose of midodrine.  4:05 PM Reassessment performed. Patient appears oriented, talkative, in c-collar.  Heading to CT now.  Most current vital signs reviewed and are as follows: BP 101/65    Pulse 67    Temp 97.8 F (36.6 C) (Oral)    Resp 18    Ht 5\' 6"  (1.676 m)    Wt 90.7 kg    SpO2 95%    BMI 32.28 kg/m   6:29 PM Reassessment performed. Patient appears comfortable.  She is talking on the phone with her sister.  Staff attempted to perform orthostatic vital signs and have patient stand at bedside.  Upon sitting on the edge of the bed, she became very lightheaded.  Patient tells me she feels like she was going to pass out.    She continues to have a lot of pain in her right hip and buttocks area.  I reevaluated this area.  Patient has a couple areas of ecchymosis and a hematoma over the posterior hip and inferior portion of the buttocks.  Will obtain CT imaging to rule out occult fracture.  Plan: Patient will likely need observation admission for hypotension, orthostasis, hip pain and inability to walk.  Most current vital signs reviewed and are as follows: BP (!) 119/58    Pulse 71    Temp 97.8 F (36.6 C) (Oral)    Resp 10    Ht 5\' 6"  (1.676 m)    Wt 90.7 kg    SpO2 91%    BMI 32.28 kg/m   8:13 PM Labs and imaging to this point personally reviewed and interpreted including: CT showing large thigh/buttocks hematoma.  Most current vital signs reviewed and are as follows: BP (!) 90/49    Pulse 70    Temp 97.8 F (36.6 C) (Oral)    Resp 18    Ht 5\' 6"  (1.676 m)    Wt 90.7 kg    SpO2 96%    BMI 32.28 kg/m   Plan: Admit  8:35 PM Dr. Matilde Sprang spoke with radiology regarding CT  findings. Including bladder wall pneumatosis. IV Rocephin and urine culture ordered.   9:24 PM Consulted with Dr. Marlowe Sax of [facility] who will see patient.   BP (!) 90/49    Pulse 70    Temp 97.8 F (36.6 C) (Oral)    Resp 18    Ht 5\' 6"  (1.676 m)    Wt 90.7 kg    SpO2 96%    BMI 32.28 kg/m       Carlisle Cater, Hershal Coria 11/12/21 2125    Teressa Lower, MD 11/13/21 [phone]

## 2021-11-12 NOTE — Assessment & Plan Note (Addendum)
CT showing hyperdense liver suspicious for hemochromatosis. -Will need outpatient iron studies for further evaluation.

## 2021-11-12 NOTE — Assessment & Plan Note (Signed)
CT renal stone study showing small to moderate size right pleural effusion, incompletely imaged.  Patient is not hypoxic. -Check BNP.  Will need dedicated CT chest for further evaluation which can be done in the morning, currently very orthostatic.

## 2021-11-13 ENCOUNTER — Inpatient Hospital Stay (HOSPITAL_COMMUNITY): Payer: Medicare Other

## 2021-11-13 DIAGNOSIS — T148XXA Other injury of unspecified body region, initial encounter: Secondary | ICD-10-CM

## 2021-11-13 DIAGNOSIS — I4819 Other persistent atrial fibrillation: Secondary | ICD-10-CM

## 2021-11-13 DIAGNOSIS — I4891 Unspecified atrial fibrillation: Secondary | ICD-10-CM

## 2021-11-13 DIAGNOSIS — E782 Mixed hyperlipidemia: Secondary | ICD-10-CM

## 2021-11-13 DIAGNOSIS — R9431 Abnormal electrocardiogram [ECG] [EKG]: Secondary | ICD-10-CM | POA: Diagnosis present

## 2021-11-13 DIAGNOSIS — R932 Abnormal findings on diagnostic imaging of liver and biliary tract: Secondary | ICD-10-CM | POA: Diagnosis not present

## 2021-11-13 DIAGNOSIS — N3090 Cystitis, unspecified without hematuria: Secondary | ICD-10-CM

## 2021-11-13 DIAGNOSIS — R55 Syncope and collapse: Secondary | ICD-10-CM

## 2021-11-13 DIAGNOSIS — N3080 Other cystitis without hematuria: Secondary | ICD-10-CM

## 2021-11-13 DIAGNOSIS — W19XXXA Unspecified fall, initial encounter: Secondary | ICD-10-CM

## 2021-11-13 DIAGNOSIS — S300XXA Contusion of lower back and pelvis, initial encounter: Secondary | ICD-10-CM

## 2021-11-13 DIAGNOSIS — Z9181 History of falling: Secondary | ICD-10-CM

## 2021-11-13 DIAGNOSIS — J90 Pleural effusion, not elsewhere classified: Secondary | ICD-10-CM

## 2021-11-13 DIAGNOSIS — I951 Orthostatic hypotension: Secondary | ICD-10-CM | POA: Diagnosis not present

## 2021-11-13 DIAGNOSIS — E038 Other specified hypothyroidism: Secondary | ICD-10-CM

## 2021-11-13 DIAGNOSIS — L8992 Pressure ulcer of unspecified site, stage 2: Secondary | ICD-10-CM

## 2021-11-13 DIAGNOSIS — Z8679 Personal history of other diseases of the circulatory system: Secondary | ICD-10-CM

## 2021-11-13 LAB — HEMOGLOBIN AND HEMATOCRIT, BLOOD
HCT: 20.6 % — ABNORMAL LOW (ref 36.0–46.0)
HCT: 26.6 % — ABNORMAL LOW (ref 36.0–46.0)
HCT: 34.8 % — ABNORMAL LOW (ref 36.0–46.0)
Hemoglobin: 6.5 g/dL — CL (ref 12.0–15.0)
Hemoglobin: 8.9 g/dL — ABNORMAL LOW (ref 12.0–15.0)
Hemoglobin: 11.1 g/dL — ABNORMAL LOW (ref 12.0–15.0)

## 2021-11-13 LAB — ECHOCARDIOGRAM COMPLETE
AR max vel: 1.34 cm2
AV Area VTI: 1.64 cm2
AV Area mean vel: 1.32 cm2
AV Mean grad: 5.3 mmHg
AV Peak grad: 10.2 mmHg
Ao pk vel: 1.6 m/s
Height: 66 in
MV M vel: 4.37 m/s
MV Peak grad: 76.4 mmHg
MV VTI: 1.33 cm2
S' Lateral: 3.2 cm
Weight: 3200 oz

## 2021-11-13 LAB — ALBUMIN: Albumin: 2.4 g/dL — ABNORMAL LOW (ref 3.5–5.0)

## 2021-11-13 LAB — PREPARE RBC (CROSSMATCH)

## 2021-11-13 LAB — PROTIME-INR
INR: 1.3 — ABNORMAL HIGH (ref 0.8–1.2)
Prothrombin Time: 16.3 seconds — ABNORMAL HIGH (ref 11.4–15.2)

## 2021-11-13 LAB — D-DIMER, QUANTITATIVE: D-Dimer, Quant: 0.98 ug/mL-FEU — ABNORMAL HIGH (ref 0.00–0.50)

## 2021-11-13 LAB — BRAIN NATRIURETIC PEPTIDE: B Natriuretic Peptide: 254.3 pg/mL — ABNORMAL HIGH (ref 0.0–100.0)

## 2021-11-13 LAB — FIBRINOGEN: Fibrinogen: 382 mg/dL (ref 210–475)

## 2021-11-13 LAB — APTT: aPTT: 27 seconds (ref 24–36)

## 2021-11-13 LAB — TROPONIN I (HIGH SENSITIVITY): Troponin I (High Sensitivity): 17 ng/L (ref <18)

## 2021-11-13 MED ORDER — SODIUM CHLORIDE 0.9 % IV SOLN
INTRAVENOUS | Status: DC
Start: 2021-11-13 — End: 2021-11-17

## 2021-11-13 MED ORDER — ACETAMINOPHEN 650 MG RE SUPP: 650.0000 mg | Freq: Four times a day (QID) | RECTAL | Status: DC | PRN

## 2021-11-13 MED ORDER — SODIUM CHLORIDE 0.9% IV SOLUTION: Freq: Once | INTRAVENOUS | Status: AC

## 2021-11-13 MED ORDER — SODIUM CHLORIDE 0.9 % IV BOLUS
500.0000 mL | Freq: Once | INTRAVENOUS | Status: AC
  Administered 2021-11-13: 500 mL via INTRAVENOUS

## 2021-11-13 MED ORDER — SODIUM CHLORIDE 0.9 % IV SOLN
1.0000 g | INTRAVENOUS | Status: DC
  Administered 2021-11-14: 1 g via INTRAVENOUS
  Filled 2021-11-13 (×2): qty 10

## 2021-11-13 MED ORDER — ACETAMINOPHEN 325 MG PO TABS
650.0000 mg | ORAL_TABLET | Freq: Four times a day (QID) | ORAL | Status: DC | PRN
  Administered 2021-11-13: 650 mg via ORAL
  Filled 2021-11-13: qty 2

## 2021-11-13 MED ORDER — MIDODRINE HCL 5 MG PO TABS
5.0000 mg | ORAL_TABLET | Freq: Two times a day (BID) | ORAL | Status: DC
  Administered 2021-11-13: 5 mg via ORAL
  Filled 2021-11-13: qty 1

## 2021-11-13 MED ORDER — LEVOTHYROXINE SODIUM 75 MCG PO TABS
150.0000 ug | ORAL_TABLET | Freq: Every day | ORAL | Status: DC
  Administered 2021-11-13 – 2021-11-14 (×2): 150 ug via ORAL
  Filled 2021-11-13 (×2): qty 2

## 2021-11-13 MED ORDER — MIDODRINE HCL 5 MG PO TABS
5.0000 mg | ORAL_TABLET | Freq: Three times a day (TID) | ORAL | Status: DC
Start: 2021-11-13 — End: 2021-11-20
  Administered 2021-11-15 – 2021-11-20 (×17): 5 mg via ORAL
  Filled 2021-11-13 (×18): qty 1

## 2021-11-13 MED ORDER — LACTATED RINGERS IV BOLUS
500.0000 mL | Freq: Once | INTRAVENOUS | Status: AC
  Administered 2021-11-13: 500 mL via INTRAVENOUS

## 2021-11-13 MED ORDER — ENSURE ENLIVE PO LIQD
237.0000 mL | Freq: Three times a day (TID) | ORAL | Status: DC
  Administered 2021-11-13 – 2021-11-19 (×12): 237 mL via ORAL

## 2021-11-13 MED ORDER — OXYCODONE-ACETAMINOPHEN 5-325 MG PO TABS
1.0000 | ORAL_TABLET | Freq: Four times a day (QID) | ORAL | Status: DC | PRN
  Administered 2021-11-14 – 2021-11-19 (×6): 1 via ORAL
  Filled 2021-11-13 (×8): qty 1

## 2021-11-13 MED ORDER — METOPROLOL TARTRATE 5 MG/5ML IV SOLN: 5.0000 mg | Freq: Once | INTRAVENOUS | Status: DC

## 2021-11-13 MED ORDER — AMIODARONE HCL 200 MG PO TABS
200.0000 mg | ORAL_TABLET | Freq: Two times a day (BID) | ORAL | Status: DC
  Administered 2021-11-13 – 2021-11-20 (×14): 200 mg via ORAL
  Filled 2021-11-13 (×15): qty 1

## 2021-11-13 MED ORDER — SODIUM CHLORIDE 0.9 % IV SOLN: INTRAVENOUS | Status: AC

## 2021-11-13 NOTE — TOC CAGE-AID Note (Signed)
Transition of Care [facility]) - CAGE-AID Screening   Patient Details  Name: MICHALLE RADEMAKER MRN: [id number] Date of Birth: [birth year]  Transition of Care [facility]) CM/SW Contact:    Christabella Alvira C Tarpley-Carter, [place] Phone Number: 11/13/2021, 12:12 PM   Clinical Narrative: Pt participated in [place].  Pt stated she does not use substance or ETOH.  Pt was not offered resources, due to no usage of substance or ETOH.    Kishan Wachsmuth Tarpley-Carter, MSW, LCSW-A Pronouns:  She/Her/Hers [place] Transitions of Care Clinical Social Worker Direct Number:  [phone] [email or web address]  CAGE-AID Screening:    Have You Ever Felt You Ought to Cut Down on Your Drinking or Drug Use?: No Have People Annoyed You By [employer] Your Drinking Or Drug Use?: No Have You Felt Bad Or Guilty About Your Drinking Or Drug Use?: No Have You Ever Had a Drink or Used Drugs First Thing In The Morning to Steady Your Nerves or to Get Rid of a Hangover?: No CAGE-AID Score: 0  Substance Abuse Education Offered: No

## 2021-11-13 NOTE — Progress Notes (Signed)
Date and time results received: 11/13/21 0331 (use smartphrase ".now" to insert current time)  Test: hgb Critical Value: 6.5  Name of Provider Notified: J.[name]  Orders Received? Or Actions Taken?: see new orders

## 2021-11-13 NOTE — Evaluation (Addendum)
Occupational Therapy Evaluation Patient Details Name: Kayla Harrison MRN: [id number] DOB: Apr 15, 1946 Today's Date: 11/13/2021   History of Present Illness Pt is a 76 y/o F presenting to ED on 2/20 after 2 falls at home, found to be hypotensive. PMH includes HTN, CVA (2022), multiple falls, A fib on eliquis, pacemaker status, thyroid and breast CA.   Clinical Impression   Pt reports independence at baseline with ADLs, has been using a rollator for mobility. Pt currently limited by gluteal and RLE pain from fall. Requiring mod- max A +2 for ADLs and bed mobility, transfers deferred at this time. Pt able to attempt sitting EOB x2 however unable to sit for more than 30 seconds before returning to supine due to pain. BP WNL during session, pt denies dizziness/nausea/lightheadedness. Pt presenting with impairments listed below, will follow. Recommend SNF at d/c pending pt progress.     Recommendations for follow up therapy are one component of a multi-disciplinary discharge planning process, led by the attending physician.  Recommendations may be updated based on patient status, additional functional criteria and insurance authorization.   Follow Up Recommendations  Skilled nursing-short term rehab (<3 hours/day)    Assistance Recommended at Discharge Intermittent Supervision/Assistance  Patient can return home with the following Two people to help with walking and/or transfers;A lot of help with bathing/dressing/bathroom;Assistance with cooking/housework;Assist for transportation;Help with stairs or ramp for entrance    Functional Status Assessment  Patient has had a recent decline in their functional status and demonstrates the ability to make significant improvements in function in a reasonable and predictable amount of time.  Equipment Recommendations  None recommended by OT;Other (comment) (defer to next venue of care)    Recommendations for Other Services PT consult     Precautions  / Restrictions Precautions Precautions: Fall Restrictions Weight Bearing Restrictions: No      Mobility Bed Mobility Overal bed mobility: Needs Assistance Bed Mobility: Rolling, Sidelying to Sit, Sit to Sidelying Rolling: Mod assist, +2 for physical assistance Sidelying to sit: Mod assist     Sit to sidelying: Mod assist General bed mobility comments: max A +2 to scoot up in bed    Transfers                   General transfer comment: deferred due to pain      Balance Overall balance assessment: Needs assistance Sitting-balance support: Feet supported, Single extremity supported Sitting balance-Leahy Scale: Poor Sitting balance - Comments: unable to sit EOB due to pain from gluteal bruising                                   ADL either performed or assessed with clinical judgement   ADL Overall ADL's : Needs assistance/impaired Eating/Feeding: Set up;Sitting   Grooming: Minimal assistance;Sitting   Upper Body Bathing: Maximal assistance;Bed level Upper Body Bathing Details (indicate cue type and reason): NT assisting with bathing during session Lower Body Bathing: Maximal assistance;Bed level Lower Body Bathing Details (indicate cue type and reason): NT assisting with bathing during session Upper Body Dressing : Moderate assistance;Bed level Upper Body Dressing Details (indicate cue type and reason): don fresh gown Lower Body Dressing: Maximal assistance;Sitting/lateral leans   Toilet Transfer: Maximal assistance;+2 for physical assistance;BSC/3in1;Stand-pivot;Moderate assistance   Toileting- Clothing Manipulation and Hygiene: Maximal assistance;Bed level       Functional mobility during ADLs: Maximal assistance;+2 for physical assistance  Vision   Vision Assessment?: No apparent visual deficits     Perception     Praxis      Pertinent Vitals/Pain Pain Assessment Pain Assessment: Faces Pain Score: 5  Faces Pain Scale: Hurts  even more Pain Location: bottom from fall Pain Descriptors / Indicators: Discomfort, Constant Pain Intervention(s): Limited activity within patient's tolerance, Monitored during session     Hand Dominance     Extremity/Trunk Assessment Upper Extremity Assessment Upper Extremity Assessment: Generalized weakness   Lower Extremity Assessment Lower Extremity Assessment: Defer to PT evaluation       Communication Communication Communication: No difficulties   Cognition Arousal/Alertness: Awake/alert Behavior During Therapy: WFL for tasks assessed/performed Overall Cognitive Status: Within Functional Limits for tasks assessed                                       General Comments  BP 118/60 at start of session, pt denies dizziness/lightheadedness/nausea    Exercises     Shoulder Instructions      Home Living Family/patient expects to be discharged to:: Private residence Living Arrangements: Alone Available Help at Discharge: Family;Available PRN/intermittently Type of Home: Apartment Home Access: Level entry     Home Layout: One level     Bathroom Shower/Tub: Occupational psychologist: Standard Bathroom Accessibility: Yes How Accessible: Accessible via walker Home Equipment: Grab bars - toilet;Grab bars - tub/shower;Rollator (4 wheels);Shower seat - built in;Cane - single point   Additional Comments: Pt has cat named "Callie"., does not wear O2 at home      Prior Functioning/Environment Prior Level of Function : Independent/Modified Independent             Mobility Comments: has been using Rollator ADLs Comments: does IADLs        OT Problem List: Decreased strength;Decreased range of motion;Decreased activity tolerance;Impaired balance (sitting and/or standing);Decreased safety awareness;Decreased knowledge of use of DME or AE;Pain      OT Treatment/Interventions: Self-care/ADL training;Therapeutic exercise;Neuromuscular  education;DME and/or AE instruction;Therapeutic activities;Patient/family education;Balance training    OT Goals(Current goals can be found in the care plan section) Acute Rehab OT Goals Patient Stated Goal: to get better OT Goal Formulation: With patient Time For Goal Achievement: 11/27/21 Potential to Achieve Goals: Good ADL Goals Pt Will Perform Upper Body Dressing: sitting;with min assist Pt Will Perform Lower Body Dressing: with mod assist;sit to/from stand;sitting/lateral leans Pt Will Transfer to Toilet: with mod assist;with +2 assist;with min assist;bedside commode;stand pivot transfer Pt Will Perform Tub/Shower Transfer: with mod assist;with +2 total assist;shower seat;rolling walker;Shower transfer  OT Frequency: Min 2X/week    Co-evaluation              AM-PAC OT "6 Clicks" Daily Activity     Outcome Measure Help from another person eating meals?: None Help from another person taking care of personal grooming?: A Little Help from another person toileting, which includes using toliet, bedpan, or urinal?: A Lot Help from another person bathing (including washing, rinsing, drying)?: A Lot Help from another person to put on and taking off regular upper body clothing?: A Lot Help from another person to put on and taking off regular lower body clothing?: Total 6 Click Score: 14   End of Session Equipment Utilized During Treatment: Oxygen (2L) Nurse Communication: Mobility status  Activity Tolerance: Patient limited by pain Patient left: in bed;with call bell/phone within reach;with nursing/sitter in  room;with family/visitor present;with bed alarm set  OT Visit Diagnosis: Unsteadiness on feet (R26.81);Other abnormalities of gait and mobility (R26.89);Muscle weakness (generalized) (M62.81);Repeated falls (R29.6);History of falling (Z91.81);Pain Pain - part of body:  (glutes, bruised from fall)                Time: 8457-3344 OT Time Calculation (min): 38 min Charges:  OT  General Charges $OT Visit: 1 Visit OT Evaluation $OT Eval Moderate Complexity: 1 Mod OT Treatments $Self Care/Home Management : 8-22 mins $Therapeutic Activity: 8-22 mins  Lynnda Child, OTD, OTR/L Acute Rehab [phone]) [phone]  Kaylyn Lim 11/13/2021, 4:55 PM

## 2021-11-13 NOTE — Progress Notes (Signed)
Initial Nutrition Assessment  DOCUMENTATION CODES:  Not applicable  INTERVENTION:  Liberalize diet to regular, encourage PO intake Ensure Enlive po TID, each supplement provides 350 kcal and 20 grams of protein. Request new measured weight  NUTRITION DIAGNOSIS:  Inadequate oral intake related to poor appetite as evidenced by per patient/family report.  GOAL:  Patient will meet greater than or equal to 90% of their needs  MONITOR:  PO intake, Labs, Supplement acceptance, Weight trends  REASON FOR ASSESSMENT:  Malnutrition Screening Tool    ASSESSMENT:  76 y.o. female with hx of HTN, HLD, hx CVA, hx of breast, thyroid, and lung cancer, non hodgkins lymphoma,and atrial fibrillation, presented to ED after 2 falls at home.  Unable to reach patient at this time, will defer interview and physical exam to in-person assessment.   Imaging in ED showed multiple hematomas to the buttocks and emphysematous cystitis. Pt reported chronic poor appetite and inadequate intake of fluids.   Pt reported significant weight loss on RN admission screen. Unable to verify as weight hx shows loss from 04/30/21-10/04/21 but complete regain of weight in the last month. Will request a new weight to determine if current is accurate.  Nutritionally Relevant Medications: Scheduled Meds:  levothyroxine  150 mcg Oral Q0600   Labs Reviewed: BUN 24 Hgb 6.5   NUTRITION - FOCUSED PHYSICAL EXAM: Defer to in-person assessment  Diet Order:   Diet Order             Diet Heart Room service appropriate? Yes; Fluid consistency: Thin  Diet effective now                   EDUCATION NEEDS:  No education needs have been identified at this time  Skin:  Skin Assessment: Reviewed RN Assessment  Last BM:  2/21 - type 6  Height:  Ht Readings from Last 1 Encounters:  11/12/21 5\' 6"  (1.676 m)    Weight:  Wt Readings from Last 1 Encounters:  11/12/21 90.7 kg    Ideal Body Weight:  59.1 kg  BMI:  Body  mass index is 32.28 kg/m.  Estimated Nutritional Needs:  Kcal:  1600-1800 kcal/d Protein:  80-90g/d Fluid:  1.8-2L/d   Ranell Patrick, RD, LDN Clinical Dietitian RD pager # available in [place]  After hours/weekend pager # available in [facility]

## 2021-11-13 NOTE — Progress Notes (Addendum)
PROGRESS NOTE    Kayla Harrison  WPY:[id number] DOB: 04/09/1946 DOA: 11/12/2021 PCP: [name], MD     Brief Narrative:  76 year old female with a past medical history of persistent A-fib/flutter on Eliquis, heart block status post PPM, hypotension on midodrine, recurrent falls, hyperlipidemia, CVA, hypothyroidism, non-Hodgkin's lymphoma in remission presented to the ED via EMS complaining of headache and right hip/buttock pain after 2 falls today.  Upon EMS arrival, patient was found to be hypotensive with blood pressure 58/32 when sitting up and SBP 90 when laying down.  She was given a 350 cc fluid bolus after which blood pressure improved to 130/62.  In the ED, patient was hypotensive again with systolic as low as 05L.  Blood pressure improved after a 500 cc fluid bolus.  Not febrile or tachycardic.  Not hypoxic.  Labs showing no leukocytosis.  Hemoglobin 10.6, was 11.9 on 10/04/2021.  COVID and influenza PCR negative.  UA with negative nitrite, small amount of leukocytes and microscopy showing >50 RBCs, 11-20 WBCs, and many bacteria.  Urine culture pending.     Chest x-ray not suggestive of pneumonia, rib fractures, or pneumothorax.     Pelvic x-ray negative for acute finding.     CT head and C-spine negative for acute finding; showing small right-sided effusion in the upper right chest at the apex measures water density but is incompletely evaluated.  Previously noted to have a right-sided effusion in June 2022 that did not track to the level of the apex.   CT renal stone study showing marked volume of air within the bladder, including throughout the bladder walls, compatible with a severe emphysematous cystitis.  Showing a large right gluteal hematoma measuring approximately 15 x 9 x 11 cm.  Showing a small to moderate size right pleural effusion, incompletely imaged.  Hyperdense liver suspicious for hemochromatosis.   CT with additional views done: "No recent fracture or  dislocation is seen in the right hip. There are large hematomas in the right gluteal muscles and in the subcutaneous plane in the right gluteal region extending to the posterior upper right thigh. There is 9.9 x 7 cm hematoma in the right gluteus maximus. There is 10.2 x 5.6 cm subcutaneous hematoma posterior to the gluteal muscles. In addition, there is extensive subcutaneous edema posterior to the gluteal and subcutaneous hematomas suggesting contusion extending from the right gluteal region and along the posterior right upper thigh."   In the ED, patient became very lightheaded/near syncopal when staff attempted to perform orthostatic vital signs and sat her up at the edge of the bed.        Patient was given fentanyl, midodrine, and ceftriaxone.   Patient states she had 2 falls at home today and injured her head and right hip/buttock.  States the first time she was caring a heavy laundry basket, lost her balance and fell on her right hip.  States she called the fire department who came and helped her sit up.  States later in the day as she was going back to the laundry room to put more laundry to the machine, she fell again and hit her head.  Patient does recall feeling lightheaded when she was walking to the laundry room and thinks she might have passed out.  She felt nauseous afterwards but did not vomit.  Endorsing dysuria.  Denies fevers, cough, shortness of breath, palpitations, chest pain, abdominal pain, or diarrhea.  Reports poor appetite and does not drink enough fluids.  She takes  Eliquis and her last dose was 24 hours ago (2/19 evening).  Reports history of chronically low blood pressure for which she takes midodrine.  Patient states her blood pressure can go as low as 80s over 40s.    Subjective: 2/21 A/O x4.  Patient states that she has been having falling episodes for approximately 2 years.  Has been worked up have not found a specific reason but from her explanation appears  that it may be secondary to A-fib with RVR as they had started her on Eliquis 2 years ago.  Unfortunately patient has continued to fall multiple times during a month (verified by her sister).  This has resulted in multiple hospitalizations secondary to serious hematoma secondary to falls.   Assessment & Plan: Covid vaccination;   Principal Problem:   Orthostatic hypotension Active Problems:   HYPERLIPIDEMIA   Pleural effusion   Hypothyroidism   Persistent atrial fibrillation (HCC)   Fall   Traumatic hematoma of buttock   Emphysematous cystitis   Abnormal finding on imaging of liver   History of fall   History of hypotension   Pressure ulcer with suspected deep tissue injury, stage II (HCC)   Prolonged QT interval  Hx hypotension/Orthostatic hypotension/Syncope -Per patient and family she has had multiple falls over the last 2 years resulting in hospitalizations. - hypotensive with blood pressure as low as 58/32 with EMS when sitting up -Patient and sisters believe most likely secondary to patient going into A-fib RVR resulting in syncope. -Pacemaker interrogated and showing no arrhythmia or high rate episodes since last interrogation on 10/04/2021. -Patient on midodrine chronically for low blood pressure however per patient also has prolonged QT interval -Cardiac monitoring.  IV fluid hydration and monitor blood pressure closely.  Keep MAP >65.   -Midodrine 5 mg TID -2/20 Echocardiogram pending . -2/21 Albumin 50 g -2/21 normal saline 71ml/hr -2/22 AM cortisol pending  Persistent atrial fibrillation (HCC)/A-fib with RVR Stable. -Hold Eliquis given large gluteal hematoma.   -Amiodarone 200 mg BID -Strict ins and out - Daily weight -2/21 Metoprolol IV 5 mg x 1 dose patient's pressure now increased.  We will see if a small dose will break her RVR.  Not terribly uncontrolled. -Monitor closely for fluid overload which could exacerbate A-fib  Prolonged QT interval - 2/21 if BP  can be maintained without midodrine recommend discontinuing   Traumatic hematoma of buttock- (present on admission) Acute on chronic blood loss anemia (hemoglobin on admission 11.2) CT showing large hematomas in the right gluteal muscles and in the subcutaneous plane in the right gluteal region extending to the posterior upper right thigh.  Hemoglobin currently 10.6, was 11.9 on 10/04/2021.  She is on Eliquis and last dose was 24 hours ago. -Hold Eliquis and monitor H&H every 6 hours.  Percocet as needed for pain, Tylenol as needed. -2/21 discussed case with Gwyndolyn Saxon [facility] given that patient on Eliquis and her last dose was 24 hours ago (2/19 given per chart did we need to reverse Eliquis?  No need for reversal of Eliquis (outside window). -2/21 transfuse for hemoglobin<7 - 2/21 transfuse 2 unit PRBC   Emphysematous cystitis- (present on admission) CT showing findings consistent with severe emphysematous cystitis.  Patient is endorsing dysuria.  No fever or leukocytosis.  UA with negative nitrite, small amount of leukocytes and microscopy showing >50 RBCs, 11-20 WBCs, and many bacteria. -Complete 5-day course Ceftriaxone,   Pleural effusion- (present on admission) -CT renal stone study showing small to moderate size right  pleural effusion, incompletely imaged.  Patient is not hypoxic. -Currently orthostatic --2/21 CT Chest Wo contrast pending to evaluate entire chest for pleural effusion    Abnormal finding on imaging of liver- (present on admission) CT showing hyperdense liver suspicious for hemochromatosis. -Will need outpatient iron studies for further evaluation.  Do not believe iron studies would be accurate given patient is receiving blood transfusion and has acute blood loss anemia.   Fall- (present on admission) -PT/OT eval, fall precautions.  TOC consulted for placement.    Hypothyroidism- (present on admission) -Synthroid 150 mcg daily   HYPERLIPIDEMIA- (present on  admission) -Hold statin at this time  LEFT thigh stage II pressure injury  Pressure Injury 03/15/21 Thigh Left;Medial Stage 2 -  Partial thickness loss of dermis presenting as a shallow open injury with a red, pink wound bed without slough. (Active)  03/15/21 0017  Location: Thigh  Location Orientation: Left;Medial  Staging: Stage 2 -  Partial thickness loss of dermis presenting as a shallow open injury with a red, pink wound bed without slough.  Wound Description (Comments):   Present on Admission: Yes    Obese (32.2 kg/m) -        DVT prophylaxis: SCD Code Status: Full Family Communication: 2/21 daughters at bedside for discussion of plan of care all questions answered Status is: Inpatient    Dispo: The patient is from: Home              Anticipated d/c is to: SNF              Anticipated d/c date is: > 3 days              Patient currently is not medically stable to d/c.      Consultants:    Procedures/Significant Events:    I have personally reviewed and interpreted all radiology studies and my findings are as above.  VENTILATOR SETTINGS:    Cultures 2/20 urine pending   Antimicrobials: Anti-infectives (From admission, onward)    Start     Ordered Stop   11/14/21 2000  cefTRIAXone (ROCEPHIN) 1 g in sodium chloride 0.9 % 100 mL IVPB        11/13/21 0008     11/12/21 2030  cefTRIAXone (ROCEPHIN) 1 g in sodium chloride 0.9 % 100 mL IVPB        11/12/21 2028 11/12/21 2214         Devices    LINES / TUBES:      Continuous Infusions:  sodium chloride 75 mL/hr at 11/13/21 1051   [START ON 11/14/2021] cefTRIAXone (ROCEPHIN)  IV       Objective: Vitals:   11/13/21 1520 11/13/21 1542 11/13/21 1700 11/13/21 1810  BP: (!) 112/57 95/60 105/65 106/64  Pulse: (!) 110  (!) 109 (!) 115  Resp: 19 18 18 18   Temp: 98.3 F (36.8 C) 98.1 F (36.7 C) 98.2 F (36.8 C) 98.4 F (36.9 C)  TempSrc: Oral  Oral Oral  SpO2: 100% 100% 100% 100%   Weight:   78.6 kg   Height:        Intake/Output Summary (Last 24 hours) at 11/13/2021 1955 Last data filed at 11/13/2021 1524 Gross per 24 hour  Intake 61.27 ml  Output --  Net 61.27 ml   Filed Weights   11/12/21 1416 11/13/21 1700  Weight: 90.7 kg 78.6 kg    Examination:  General: A/O x4, though she does have some forgetfulness for dates.,  No acute  respiratory distress Eyes: negative scleral hemorrhage, negative anisocoria, negative icterus ENT: Negative Runny nose, negative gingival bleeding, Neck:  Negative scars, masses, torticollis, lymphadenopathy, JVD Lungs: Clear to auscultation bilaterally without wheezes or crackles Cardiovascular: Irregular irregular rhythm and rate without murmur gallop or rub normal S1 and S2 Abdomen: negative abdominal pain, nondistended, positive soft, bowel sounds, no rebound, no ascites, no appreciable mass Extremities: No significant cyanosis, clubbing, or edema bilateral lower extremities Skin: Patient with hematoma on right buttocks extending caudally into lower back, and hematoma left buttocks. Psychiatric:  Negative depression, negative anxiety, negative fatigue, negative mania  Central nervous system:  Cranial nerves II through XII intact, tongue/uvula midline, all extremities muscle strength 5/5, sensation intact throughout, negative dysarthria, negative expressive aphasia, negative receptive aphasia.  .     Data Reviewed: Care during the described time interval was provided by me .  I have reviewed this patient's available data, including medical history, events of note, physical examination, and all test results as part of my evaluation.  CBC: Recent Labs  Lab 11/12/21 1420 11/12/21 1432 11/13/21 0241 11/13/21 1050  WBC 6.4  --   --   --   NEUTROABS 4.8  --   --   --   HGB 10.6* 11.2* 6.5* 8.9*  HCT 34.5* 33.0* 20.6* 26.6*  MCV 90.3  --   --   --   PLT 164  --   --   --    Basic Metabolic Panel: Recent Labs  Lab  11/12/21 1420 11/12/21 1432  NA 142 140  K 4.0 3.9  CL 107 107  CO2 27  --   GLUCOSE 116* 111*  BUN 22 24*  CREATININE 0.97 1.00  CALCIUM 8.8*  --    GFR: Estimated Creatinine Clearance: 49.8 mL/min (by C-G formula based on SCr of 1 mg/dL). Liver Function Tests: Recent Labs  Lab 11/13/21 1050  ALBUMIN 2.4*   No results for input(s): LIPASE, AMYLASE in the last 168 hours. No results for input(s): AMMONIA in the last 168 hours. Coagulation Profile: Recent Labs  Lab 11/12/21 1420  INR 1.6*   Cardiac Enzymes: No results for input(s): CKTOTAL, CKMB, CKMBINDEX, TROPONINI in the last 168 hours. BNP (last 3 results) No results for input(s): PROBNP in the last 8760 hours. HbA1C: No results for input(s): HGBA1C in the last 72 hours. CBG: No results for input(s): GLUCAP in the last 168 hours. Lipid Profile: No results for input(s): CHOL, HDL, LDLCALC, TRIG, CHOLHDL, LDLDIRECT in the last 72 hours. Thyroid Function Tests: No results for input(s): TSH, T4TOTAL, FREET4, T3FREE, THYROIDAB in the last 72 hours. Anemia Panel: No results for input(s): VITAMINB12, FOLATE, FERRITIN, TIBC, IRON, RETICCTPCT in the last 72 hours. Sepsis Labs: No results for input(s): PROCALCITON, LATICACIDVEN in the last 168 hours.  Recent Results (from the past 240 hour(s))  Resp Panel by RT-PCR (Flu A&B, Covid) Nasopharyngeal Swab     Status: None   Collection Time: 11/12/21  2:06 PM   Specimen: Nasopharyngeal Swab; Nasopharyngeal(NP) swabs in vial transport medium  Result Value Ref Range Status   SARS Coronavirus 2 by RT PCR NEGATIVE NEGATIVE Final    Comment: (NOTE) SARS-CoV-2 target nucleic acids are NOT DETECTED.  The SARS-CoV-2 RNA is generally detectable in upper respiratory specimens during the acute phase of infection. The lowest concentration of SARS-CoV-2 viral copies this assay can detect is 138 copies/mL. A negative result does not preclude SARS-Cov-2 infection and should not be used  as the sole basis for  treatment or other patient management decisions. A negative result may occur with  improper specimen collection/handling, submission of specimen other than nasopharyngeal swab, presence of viral mutation(s) within the areas targeted by this assay, and inadequate number of viral copies(<138 copies/mL). A negative result must be combined with clinical observations, patient history, and epidemiological information. The expected result is Negative.  Fact Sheet for Patients:  EntrepreneurPulse.com.au  Fact Sheet for Healthcare Providers:  IncredibleEmployment.be  This test is no t yet approved or cleared by the [place] FDA and  has been authorized for detection and/or diagnosis of SARS-CoV-2 by FDA under an Emergency Use Authorization (EUA). This EUA will remain  in effect (meaning this test can be used) for the duration of the COVID-19 declaration under Section 564(b)(1) of the Act, 21 U.S.C.section 360bbb-3(b)(1), unless the authorization is terminated  or revoked sooner.       Influenza A by PCR NEGATIVE NEGATIVE Final   Influenza B by PCR NEGATIVE NEGATIVE Final    Comment: (NOTE) The Xpert Xpress SARS-CoV-2/FLU/RSV plus assay is intended as an aid in the diagnosis of influenza from Nasopharyngeal swab specimens and should not be used as a sole basis for treatment. Nasal washings and aspirates are unacceptable for Xpert Xpress SARS-CoV-2/FLU/RSV testing.  Fact Sheet for Patients: EntrepreneurPulse.com.au  Fact Sheet for Healthcare Providers: IncredibleEmployment.be  This test is not yet approved or cleared by the [place] FDA and has been authorized for detection and/or diagnosis of SARS-CoV-2 by FDA under an Emergency Use Authorization (EUA). This EUA will remain in effect (meaning this test can be used) for the duration of the COVID-19 declaration under Section 564(b)(1)  of the Act, 21 U.S.C. section 360bbb-3(b)(1), unless the authorization is terminated or revoked.  Performed at [facility], [place] [address], [place], [place] [address]          Radiology Studies: CT Head Wo Contrast  Result Date: 11/12/2021 CLINICAL DATA:  Head trauma, moderate-severe EXAM: CT HEAD WITHOUT CONTRAST TECHNIQUE: Contiguous axial images were obtained from the base of the skull through the vertex without intravenous contrast. RADIATION DOSE REDUCTION: This exam was performed according to the departmental dose-optimization program which includes automated exposure control, adjustment of the mA and/or kV according to patient size and/or use of iterative reconstruction technique. COMPARISON:  03/15/2021 FINDINGS: Brain: No evidence of acute infarction, hemorrhage, hydrocephalus, extra-axial collection or mass lesion/mass effect. Small focus of encephalomalacia within the left caudate head at site of previously seen hemorrhage. Moderate-advanced low-density changes within the periventricular and subcortical white matter compatible with chronic microvascular ischemic change. Mild diffuse cerebral volume loss. Vascular: Atherosclerotic calcifications involving the large vessels of the skull base. No unexpected hyperdense vessel. Skull: Normal. Negative for fracture or focal lesion. Sinuses/Orbits: No acute finding. Other: None. IMPRESSION: 1. No acute intracranial abnormality. 2. Chronic microvascular ischemic disease. Electronically Signed   By: Davina Poke D.O.   On: 11/12/2021 16:25   CT Cervical Spine Wo Contrast  Result Date: 11/12/2021 CLINICAL DATA:  Neck trauma in a 76 year old female. EXAM: CT CERVICAL SPINE WITHOUT CONTRAST TECHNIQUE: Multidetector CT imaging of the cervical spine was performed without intravenous contrast. Multiplanar CT image reconstructions were also generated. RADIATION DOSE REDUCTION: This exam was performed according to the departmental  dose-optimization program which includes automated exposure control, adjustment of the mA and/or kV according to patient size and/or use of iterative reconstruction technique. COMPARISON:  December 15, 2020. FINDINGS: Alignment: Normal. Skull base and vertebrae: No acute fracture. No primary  bone lesion or focal pathologic process. Soft tissues and spinal canal: No prevertebral fluid or swelling. No visible canal hematoma. Disc levels: Multilevel degenerative changes with small to moderate anterior osteophytes greatest at C5-6. Moderate to marked uncovertebral spurring with some neural foraminal narrowing at C5-6 and C6-7 greatest on the RIGHT. Upper chest: Small RIGHT-sided effusion in the upper RIGHT chest measures water density but is incompletely evaluated. Incidental imaging of upper chest is otherwise unremarkable. Other: None IMPRESSION: 1. No acute fracture or static subluxation of the cervical spine. 2. Multilevel degenerative changes with some neural foraminal narrowing at C5-6 and C6-7 greatest on the RIGHT. 3. Small RIGHT-sided effusion in the upper RIGHT chest at the apex measures water density but is incompletely evaluated. Previously noted to have a RIGHT-sided effusion in June of 2022 that did not track to the level of the apex. Electronically Signed   By: Zetta Bills M.D.   On: 11/12/2021 16:35   DG Pelvis Portable  Result Date: 11/12/2021 CLINICAL DATA:  Level 2 trauma, fall, pain EXAM: PORTABLE PELVIS 1-2 VIEWS COMPARISON:  12/15/2020 FINDINGS: Bones are osteopenic. Similar degenerative changes of the pelvis and both hips. No malalignment or acute osseous finding. Bony pelvis and hips appear symmetric and intact. Gastric banding hardware noted. Aortoiliac and femoral atherosclerosis present. IMPRESSION: No acute osseous finding. Electronically Signed   By: Jerilynn Mages.  Shick M.D.   On: 11/12/2021 14:26   DG Chest Portable 1 View  Result Date: 11/12/2021 CLINICAL DATA:  Level 2 trauma, fall EXAM:  PORTABLE CHEST 1 VIEW COMPARISON:  12/15/2020 FINDINGS: Stable postoperative changes including left subclavian 2 lead pacer, left atrial appendage clip, and right shoulder reverse arthroplasty. Stable heart size and vascularity. Minor right base atelectasis or scarring. No focal pneumonia, collapse or consolidation. No large effusion or pneumothorax. Trachea midline. Aorta atherosclerotic. IMPRESSION: Stable chronic postoperative findings. Right basilar atelectasis versus scarring. Aortic Atherosclerosis (ICD10-I70.0). Electronically Signed   By: Jerilynn Mages.  Shick M.D.   On: 11/12/2021 14:24   ECHOCARDIOGRAM COMPLETE  Result Date: 11/13/2021    ECHOCARDIOGRAM REPORT   Patient Name:   Kayla Harrison Date of Exam: 11/13/2021 Medical Rec #:  [id number]           Height:       66.0 in Accession #:    [id number]          Weight:       200.0 lb Date of Birth:  Mar 25, 1946            BSA:          2.000 m Patient Age:    40 years            BP:           81/59 mmHg Patient Gender: F                   HR:           128 bpm. Exam Location:  Inpatient Procedure: 2D Echo, Cardiac Doppler and Color Doppler Indications:    Syncope  History:        Patient has prior history of Echocardiogram examinations, most                 recent 10/04/2021. Stroke, Arrythmias:Atrial Fibrillation; Risk                 Factors:Dyslipidemia and Hypertension. 03/09/2021 cardioversion  07/21/2019 cath.  Sonographer:    Luisa Hart RDCS Referring Phys: [id number] [place]  1. Left ventricular ejection fraction, by estimation, is 55 to 60%. The left ventricle has normal function. The left ventricle has no regional wall motion abnormalities. Indeterminate diastolic filling due to E-A fusion. There is the interventricular septum is flattened in diastole ('D' shaped left ventricle), consistent with right ventricular volume overload.  2. Right ventricular systolic function is mildly reduced. The right ventricular size is  mildly enlarged. There is mildly elevated pulmonary artery systolic pressure.  3. Left atrial size was moderately dilated.  4. The mitral valve is degenerative. Moderate mitral valve regurgitation. Mild mitral stenosis. The mean mitral valve gradient is 5.0 mmHg with average heart rate of 122 bpm. Severe mitral annular calcification.  5. Tricuspid valve regurgitation is severe.  6. The aortic valve is normal in structure. There is mild calcification of the aortic valve. There is mild thickening of the aortic valve. Aortic valve regurgitation is not visualized. Aortic valve sclerosis is present, with no evidence of aortic valve stenosis.  7. The inferior vena cava is dilated in size with >50% respiratory variability, suggesting right atrial pressure of 8 mmHg. Comparison(s): Prior images reviewed side by side. Mitral insufficiency appears to be less severe. FINDINGS  Left Ventricle: Left ventricular ejection fraction, by estimation, is 55 to 60%. The left ventricle has normal function. The left ventricle has no regional wall motion abnormalities. The left ventricular internal cavity size was normal in size. There is  no left ventricular hypertrophy. The interventricular septum is flattened in diastole ('D' shaped left ventricle), consistent with right ventricular volume overload. Indeterminate diastolic filling due to E-A fusion. Right Ventricle: The right ventricular size is mildly enlarged. No increase in right ventricular wall thickness. Right ventricular systolic function is mildly reduced. There is mildly elevated pulmonary artery systolic pressure. The tricuspid regurgitant  velocity is 2.92 m/s, and with an assumed right atrial pressure of 8 mmHg, the estimated right ventricular systolic pressure is 34.2 mmHg. Left Atrium: Left atrial size was moderately dilated. Right Atrium: Right atrial size was normal in size. Pericardium: There is no evidence of pericardial effusion. Mitral Valve: The mitral valve is  degenerative in appearance. Severe mitral annular calcification. Moderate mitral valve regurgitation, with centrally-directed jet. Mild mitral valve stenosis. MV peak gradient, 8.9 mmHg. The mean mitral valve gradient is  5.0 mmHg with average heart rate of 122 bpm. Tricuspid Valve: The tricuspid valve is grossly normal. Tricuspid valve regurgitation is severe. Aortic Valve: The aortic valve is normal in structure. There is mild calcification of the aortic valve. There is mild thickening of the aortic valve. Aortic valve regurgitation is not visualized. Aortic valve sclerosis is present, with no evidence of aortic valve stenosis. Aortic valve mean gradient measures 5.2 mmHg. Aortic valve peak gradient measures 10.2 mmHg. Aortic valve area, by VTI measures 1.64 cm. Pulmonic Valve: The pulmonic valve was grossly normal. Pulmonic valve regurgitation is not visualized. Aorta: The aortic root is normal in size and structure. Venous: The inferior vena cava is dilated in size with greater than 50% respiratory variability, suggesting right atrial pressure of 8 mmHg. The inferior vena cava and the hepatic vein show a pattern of systolic flow reversal, suggestive of tricuspid regurgitation. IAS/Shunts: There is redundancy of the interatrial septum. No atrial level shunt detected by color flow Doppler. Additional Comments: A device lead is visualized in the right ventricle.  LEFT VENTRICLE PLAX 2D LVIDd:  4.60 cm LVIDs:         3.20 cm LV PW:         1.00 cm LV IVS:        0.90 cm LVOT diam:     1.70 cm LV SV:         30 LV SV Index:   15 LVOT Area:     2.27 cm  RIGHT VENTRICLE RV Basal diam:  3.50 cm RV Mid diam:    1.60 cm LEFT ATRIUM              Index        RIGHT ATRIUM           Index LA diam:        4.30 cm  2.15 cm/m   RA Area:     14.10 cm LA Vol (A2C):   68.2 ml  34.10 ml/m  RA Volume:   29.50 ml  14.75 ml/m LA Vol (A4C):   107.0 ml 53.51 ml/m LA Biplane Vol: 86.5 ml  43.25 ml/m  AORTIC VALVE                      PULMONIC VALVE AV Area (Vmax):    1.34 cm      PV Vmax:       0.83 m/s AV Area (Vmean):   1.32 cm      PV Vmean:      60.233 cm/s AV Area (VTI):     1.64 cm      PV VTI:        0.116 m AV Vmax:           159.75 cm/s   PV Peak grad:  2.8 mmHg AV Vmean:          104.500 cm/s  PV Mean grad:  1.7 mmHg AV VTI:            0.185 m AV Peak Grad:      10.2 mmHg AV Mean Grad:      5.2 mmHg LVOT Vmax:         94.50 cm/s LVOT Vmean:        60.900 cm/s LVOT VTI:          0.134 m LVOT/AV VTI ratio: 0.72  AORTA Ao Root diam: 2.40 cm Ao Asc diam:  2.90 cm MITRAL VALVE                TRICUSPID VALVE MV Area VTI:  1.33 cm      TR Peak grad:   34.1 mmHg MV Peak grad: 8.9 mmHg      TR Vmax:        292.00 cm/s MV Mean grad: 5.0 mmHg MV Vmax:      1.49 m/s      SHUNTS MV Vmean:     107.0 cm/s    Systemic VTI:  0.13 m MV VTI:       0.23 m        Systemic Diam: 1.70 cm MR Peak grad: 76.4 mmHg MR Mean grad: 53.5 mmHg MR Vmax:      437.00 cm/s MR Vmean:     346.5 cm/s MV E velocity: 139.00 cm/s Dani Gobble Croitoru MD Electronically signed by Sanda Klein MD Signature Date/Time: 11/13/2021/11:57:00 AM    Final    CT Renal Stone Study  Result Date: 11/12/2021 CLINICAL DATA:  Nephrolithiasis. EXAM: CT ABDOMEN AND PELVIS WITHOUT CONTRAST TECHNIQUE: Multidetector CT imaging of the  abdomen and pelvis was performed following the standard protocol without IV contrast. RADIATION DOSE REDUCTION: This exam was performed according to the departmental dose-optimization program which includes automated exposure control, adjustment of the mA and/or kV according to patient size and/or use of iterative reconstruction technique. COMPARISON:  CT abdomen dated 07/18/2020. FINDINGS: Lower chest: RIGHT pleural effusion, small to moderate in size, incompletely imaged. Hepatobiliary: Liver is hyperdense. No focal mass or lesion is seen within the liver. Status post cholecystectomy. No significant bile duct dilatation appreciated. Pancreas:  Unremarkable. No pancreatic ductal dilatation or surrounding inflammatory changes. Spleen: Unremarkable. Adrenals/Urinary Tract: Adrenal glands are unremarkable. Multiple LEFT renal stones, largest measuring 7 mm. No hydronephrosis. Several punctate RIGHT renal stones. No hydronephrosis. No ureteral or bladder calculi are identified. Marked amount of air within the bladder, including throughout the bladder walls, compatible with emphysematous cystitis. Stomach/Bowel: No dilated large or small bowel loops. No evidence of bowel wall inflammation. Lap band in place. Stomach otherwise unremarkable. Scattered diverticulosis of the descending and sigmoid colon but no focal inflammatory change to suggest acute diverticulitis. Vascular/Lymphatic: Aortic atherosclerosis. No abdominal aortic aneurysm. No enlarged lymph nodes seen in the abdomen or pelvis. Reproductive: Presumed hysterectomy. No adnexal mass or free fluid seen. Other: No free fluid or abscess collection is seen in the abdomen or pelvis. No free intraperitoneal air seen. Musculoskeletal: Degenerative spondylosis of the thoracolumbar spine, mild to moderate in degree. No acute-appearing osseous abnormality. Heterogeneously dense mass-like structure involving the RIGHT gluteus musculature, extending into the overlying subcutaneous soft tissues, measuring approximately 15 x 9 x 11 cm, presumably a large hematoma, less likely abscess or neoplastic mass. IMPRESSION: 1. Marked amount of air within the bladder, including throughout the bladder walls, compatible with a severe emphysematous cystitis. 2. Very large mass-like structure involving the RIGHT gluteus musculature, extending into the overlying subcutaneous soft tissues, measuring approximately 15 x 9 x 11 cm, presumably a large hematoma, less likely abscess or neoplastic mass. Ordering physician states that patient has had a recent fall, therefore, this is consistent with a large hematoma. 3. Bilateral  nephrolithiasis. No hydronephrosis. No ureteral or bladder calculi are identified. 4. Colonic diverticulosis without evidence of acute diverticulitis. 5. RIGHT pleural effusion, small to moderate in size, incompletely imaged. 6. Liver is hyperdense suggesting hemochromatosis. These results were called by telephone at the time of interpretation on 11/12/2021 at 8:27 pm to provider [facility] , who verbally acknowledged these results. Electronically Signed   By: Franki Cabot M.D.   On: 11/12/2021 20:28   CT NO CHARGE  Result Date: 11/12/2021 CLINICAL DATA:  Trauma, fall, pain EXAM: CT ADDITIONAL VIEWS AT NO CHARGE TECHNIQUE: Axial, coronal and sagittal images of right hip were processed from the CT renal stone study. CONTRAST:  None COMPARISON:  None. FINDINGS: No recent fracture is seen in the right hip. There is no dislocation. There is large area of subcutaneous contusion/hematoma in the right gluteal region. There is 9.9 x 7 cm hematoma in the right gluteal muscles. There is another 10.2 x 5.6 cm hematoma in the subcutaneous plane. There is no significant effusion in the right hip joint. Extensive arterial calcifications are seen. There is possible peritoneal dialysis catheter in the lower abdomen. There is extensive pneumatosis in the bladder wall. There is air in the lumen of urinary bladder. Large amount of stool is seen in the rectosigmoid. IMPRESSION: No recent fracture or dislocation is seen in the right hip. There are large hematomas in the right gluteal muscles and  in the subcutaneous plane in the right gluteal region extending to the posterior upper right thigh. There is 9.9 x 7 cm hematoma in the right gluteus maximus. There is 10.2 x 5.6 cm subcutaneous hematoma posterior to the gluteal muscles. In addition, there is extensive subcutaneous edema posterior to the gluteal and subcutaneous hematomas suggesting contusion extending from the right gluteal region and along the posterior right upper  thigh. There is extensive pneumatosis in the urinary bladder wall. There is air in the lumen of urinary bladder. These findings may suggest recent intervention or infectious process such as emphysematous cystitis. Electronically Signed   By: Elmer Picker M.D.   On: 11/12/2021 20:26        Scheduled Meds:  amiodarone  200 mg Oral BID   feeding supplement  237 mL Oral TID BM   levothyroxine  150 mcg Oral Q0600   metoprolol tartrate  5 mg Intravenous Once   midodrine  5 mg Oral TID WC   Continuous Infusions:  sodium chloride 75 mL/hr at 11/13/21 1051   [START ON 11/14/2021] cefTRIAXone (ROCEPHIN)  IV       LOS: 1 day    Time spent:40 min    Sussie Minor, Geraldo Docker, MD [facility]   If 7PM-7AM, please contact night-coverage 11/13/2021, 7:55 PM

## 2021-11-13 NOTE — Assessment & Plan Note (Signed)
Stable. -Hold Eliquis given large gluteal hematoma.  Continue amiodarone.

## 2021-11-13 NOTE — Assessment & Plan Note (Signed)
Continue Synthroid °

## 2021-11-13 NOTE — Progress Notes (Signed)
PT Cancellation Note  Patient Details Name: Kayla Harrison MRN: [id number] DOB: [birth year]   Cancelled Treatment:    Reason Eval/Treat Not Completed: Medical issues which prohibited therapy.  Anemic, has continued low BP readings and is tachycardic.  Hold PT and recheck medically to see when she is appropriate.   Ramond Dial 11/13/2021, 10:53 AM  [name], PT PhD Acute Rehab Dept. Number: [place] and [place]

## 2021-11-13 NOTE — Progress Notes (Signed)
PT Cancellation Note  Patient Details Name: Kayla Harrison MRN: [id number] DOB: 10/24/1945   Cancelled Treatment:    Reason Eval/Treat Not Completed: Other (comment).  Pt is having EKG and awaiting another unit of blood.  Retry at another time.   Ramond Dial 11/13/2021, 2:46 PM  [name], PT PhD Acute Rehab Dept. Number: [place] and [place]

## 2021-11-13 NOTE — Assessment & Plan Note (Signed)
-  Hold statin at this time

## 2021-11-13 NOTE — Progress Notes (Signed)
°   11/13/21 0328  Vitals  Temp 98 F (36.7 C)  Temp Source Oral  BP (!) 69/35  BP Location Left Arm  Patient Position (if appropriate) Lying  Pulse Rate (!) 126  Pulse Rate Source Dinamap  Resp 20  Level of Consciousness  Level of Consciousness Alert  MEWS COLOR  MEWS Score Color Red  Oxygen Therapy  SpO2 98 %  O2 Device Room Air  Pain Assessment  Pain Scale 0-10  Pain Score 0  MEWS Score  MEWS Temp 0  MEWS Systolic 3  MEWS Pulse 2  MEWS RR 0  MEWS LOC 0  MEWS Score 5  Provider Notification  Provider Name/Title J. Olena Heckle  Date Provider Notified 11/13/21  Time Provider Notified [phone]  Notification Type Page  Notification Reason Critical result  Provider response See new orders  Date of Provider Response 11/13/21  Time of Provider Response [phone]

## 2021-11-14 ENCOUNTER — Inpatient Hospital Stay (HOSPITAL_COMMUNITY): Payer: Medicare Other

## 2021-11-14 DIAGNOSIS — I951 Orthostatic hypotension: Secondary | ICD-10-CM | POA: Diagnosis not present

## 2021-11-14 LAB — BPAM RBC
Blood Product Expiration Date: 202303182359
Blood Product Expiration Date: 202303232359
Blood Product Expiration Date: 202303232359
ISSUE DATE / TIME: 202302210453
ISSUE DATE / TIME: 202302211109
ISSUE DATE / TIME: 202302211448
Unit Type and Rh: 5100
Unit Type and Rh: 5100
Unit Type and Rh: 5100

## 2021-11-14 LAB — COMPREHENSIVE METABOLIC PANEL
ALT: 58 U/L — ABNORMAL HIGH (ref 0–44)
AST: 53 U/L — ABNORMAL HIGH (ref 15–41)
Albumin: 2.3 g/dL — ABNORMAL LOW (ref 3.5–5.0)
Alkaline Phosphatase: 65 U/L (ref 38–126)
Anion gap: 10 (ref 5–15)
BUN: 22 mg/dL (ref 8–23)
CO2: 24 mmol/L (ref 22–32)
Calcium: 8.1 mg/dL — ABNORMAL LOW (ref 8.9–10.3)
Chloride: 106 mmol/L (ref 98–111)
Creatinine, Ser: 1 mg/dL (ref 0.44–1.00)
GFR, Estimated: 58 mL/min — ABNORMAL LOW (ref >60)
Glucose, Bld: 103 mg/dL — ABNORMAL HIGH (ref 70–99)
Potassium: 4 mmol/L (ref 3.5–5.1)
Sodium: 140 mmol/L (ref 135–145)
Total Bilirubin: 0.8 mg/dL (ref 0.3–1.2)
Total Protein: 4.6 g/dL — ABNORMAL LOW (ref 6.5–8.1)

## 2021-11-14 LAB — TYPE AND SCREEN
ABO/RH(D): O POS
Antibody Screen: NEGATIVE
Unit division: 0
Unit division: 0
Unit division: 0

## 2021-11-14 LAB — CBC WITH DIFFERENTIAL/PLATELET
Abs Immature Granulocytes: 0.07 10*3/uL (ref 0.00–0.07)
Basophils Absolute: 0.1 10*3/uL (ref 0.0–0.1)
Basophils Relative: 1 %
Eosinophils Absolute: 0.2 10*3/uL (ref 0.0–0.5)
Eosinophils Relative: 3 %
HCT: 31.7 % — ABNORMAL LOW (ref 36.0–46.0)
Hemoglobin: 10.7 g/dL — ABNORMAL LOW (ref 12.0–15.0)
Immature Granulocytes: 1 %
Lymphocytes Relative: 7 %
Lymphs Abs: 0.7 10*3/uL (ref 0.7–4.0)
MCH: 28.8 pg (ref 26.0–34.0)
MCHC: 33.8 g/dL (ref 30.0–36.0)
MCV: 85.4 fL (ref 80.0–100.0)
Monocytes Absolute: 1.4 10*3/uL — ABNORMAL HIGH (ref 0.1–1.0)
Monocytes Relative: 15 %
Neutro Abs: 7 10*3/uL (ref 1.7–7.7)
Neutrophils Relative %: 73 %
Platelets: 144 10*3/uL — ABNORMAL LOW (ref 150–400)
RBC: 3.71 MIL/uL — ABNORMAL LOW (ref 3.87–5.11)
RDW: 17.8 % — ABNORMAL HIGH (ref 11.5–15.5)
WBC: 9.4 10*3/uL (ref 4.0–10.5)
nRBC: 0 % (ref 0.0–0.2)

## 2021-11-14 LAB — CORTISOL-AM, BLOOD: Cortisol - AM: 16.6 ug/dL (ref 6.7–22.6)

## 2021-11-14 LAB — TSH: TSH: 0.049 u[IU]/mL — ABNORMAL LOW (ref 0.350–4.500)

## 2021-11-14 LAB — PHOSPHORUS: Phosphorus: 2.6 mg/dL (ref 2.5–4.6)

## 2021-11-14 LAB — MAGNESIUM: Magnesium: 1.6 mg/dL — ABNORMAL LOW (ref 1.7–2.4)

## 2021-11-14 MED ORDER — ROSUVASTATIN CALCIUM 5 MG PO TABS: 10.0000 mg | ORAL_TABLET | ORAL | Status: DC

## 2021-11-14 MED ORDER — LEVOTHYROXINE SODIUM 25 MCG PO TABS
125.0000 ug | ORAL_TABLET | Freq: Every day | ORAL | Status: DC
  Administered 2021-11-15 – 2021-11-20 (×6): 125 ug via ORAL
  Filled 2021-11-14 (×6): qty 1

## 2021-11-14 MED ORDER — MAGNESIUM SULFATE 2 GM/50ML IV SOLN
2.0000 g | Freq: Once | INTRAVENOUS | Status: AC
  Administered 2021-11-14: 2 g via INTRAVENOUS
  Filled 2021-11-14: qty 50

## 2021-11-14 NOTE — Progress Notes (Addendum)
Mobility Specialist Progress Note: ° ° 11/14/21 1319  °Mobility  °Activity Ambulated with assistance in room  °Level of Assistance Minimal assist, patient does 75% or more  °Assistive Device Front wheel walker  °Distance Ambulated (ft) 6 ft  °Activity Response Tolerated well  °$Mobility charge 1 Mobility  ° °Pt received in chair asking to go back to bed. Complaints of 9/10 R hip pain. MinA to stand from recliner. Left in bed with call bell in reach and all needs met, ° °  °Mobility Specialist °Primary Phone [phone] ° °

## 2021-11-14 NOTE — Progress Notes (Addendum)
PROGRESS NOTE    Kayla Harrison  AOZ:[id number] DOB: 16-Mar-1946 DOA: 11/12/2021 PCP: [name], MD  Brief Narrative: 76 year old female lives at home alone with multiple falls.  She has history of atrial fibrillation heart block status post permanent pacemaker, chronic hypotension on midodrine Chest x-ray not suggestive of pneumonia, rib fractures, or pneumothorax.     Pelvic x-ray negative for acute finding.     CT head and C-spine negative for acute finding; showing small right-sided effusion in the upper right chest at the apex measures water density but is incompletely evaluated.  Previously noted to have a right-sided effusion in June 2022 that did not track to the level of the apex.   CT renal stone study showing marked volume of air within the bladder, including throughout the bladder walls, compatible with a severe emphysematous cystitis.  Showing a large right gluteal hematoma measuring approximately 15 x 9 x 11 cm.  Showing a small to moderate size right pleural effusion, incompletely imaged.  Hyperdense liver suspicious for hemochromatosis.   CT with additional views done: "No recent fracture or dislocation is seen in the right hip. There are large hematomas in the right gluteal muscles and in the subcutaneous plane in the right gluteal region extending to the posterior upper right thigh. There is 9.9 x 7 cm hematoma in the right gluteus maximus. There is 10.2 x 5.6 cm subcutaneous hematoma posterior to the gluteal muscles. In addition, there is extensive subcutaneous edema posterior to the gluteal and subcutaneous hematomas suggesting contusion extending from the right gluteal region and along the posterior right upper thigh."  Assessment & Plan:   Principal Problem:   Orthostatic hypotension Active Problems:   HYPERLIPIDEMIA   Pleural effusion   Hypothyroidism   Persistent atrial fibrillation ([place])   Fall   Traumatic hematoma of buttock   Emphysematous  cystitis   Abnormal finding on imaging of liver   History of fall   History of hypotension   Pressure ulcer with suspected deep tissue injury, stage II (HCC)   Prolonged QT interval   #1 orthostatic hypotension with syncope-her blood pressure was 58/32 with EMS.  Patient has had multiple falls at home prior to admission.  Pacemaker interrogated and shows no signs of arrhythmia. Patient on normal saline 50 cc an hour Cortisol 16.6 Status post albumin Echocardiogram ordered TSH 0.049  #2 chronic persistent atrial fibrillation with RVR -received metoprolol 5 mg IV x1 with improvement in the rate. on amiodarone 200 twice daily Eliquis on hold due to large gluteal hematoma  #3 anemia due to traumatic hematoma to the right buttocks status post 2 units of packed RBC Her hemoglobin dropped to 6.5 and she received 2 units of packed RBC with appropriate response hemoglobin up to 10.7 today  #4 emphysematous cystitis with patient complaining of dysuria completed 5-day course of Rocephin.  #5 hypothyroidism on Synthroid TSH is 0.049 we will decrease the dose of Synthroid.  #6 hyperlipidemia holding statin due to elevated LFTs  #7 hypomagnesemia replete and recheck levels in a.m.  #8?  Hemochromatosis of the liver CT with findings suspicious for hemochromatosis of the liver.  Will need outpatient follow-up.  #9 multiple falls lives alone await PT OT recommendations.  #10  Right buttock hematoma and bruises present on admission.  #11 edema to the right f heel keep legs elevated and floating  #12 elevated LFTs we will hold statin for now and recheck labs in AM.  Nutrition Problem: Inadequate oral intake Etiology: poor  appetite   Signs/Symptoms: per patient/family report    Interventions: Refer to RD note for recommendations  Estimated body mass index is 27.97 kg/m as calculated from the following:   Height as of this encounter: 5\' 6"  (1.676 m).   Weight as of this encounter:  78.6 kg.  DVT prophylaxis: None due to large hematoma of the right buttocks and multiple falls Code Status: Full code  family Communication: None at bedside  disposition Plan:  Status is: Inpatient Remains inpatient appropriate because: IV fluids PT eval pending multiple falls large buttock hematoma   Consultants:  None  Procedures: None Antimicrobials: Rocephin Subjective: Patient is resting in bed complains of pain to the right heel  Objective: Vitals:   11/13/21 1959 11/13/21 2343 11/14/21 0332 11/14/21 0826  BP: (!) 97/57 104/68 116/69 123/78  Pulse: (!) 58 (!) 112 (!) 105 (!) 107  Resp: 18 20 20 18   Temp: 98.5 F (36.9 C) 98.3 F (36.8 C) 98.4 F (36.9 C) 98.4 F (36.9 C)  TempSrc: Oral Oral Oral Oral  SpO2: 93% 98% 98% 95%  Weight:      Height:        Intake/Output Summary (Last 24 hours) at 11/14/2021 1156 Last data filed at 11/13/2021 1524 Gross per 24 hour  Intake 61.27 ml  Output --  Net 61.27 ml   Filed Weights   11/12/21 1416 11/13/21 1700  Weight: 90.7 kg 78.6 kg    Examination:  General exam: Appears frail elderly female Respiratory system: Clear to auscultation. Respiratory effort normal. Cardiovascular system: S1 & S2 heard, RRR. No JVD, murmurs, rubs, gallops or clicks. No pedal edema. Gastrointestinal system: Abdomen is nondistended, soft and nontender. No organomegaly or masses felt. Normal bowel sounds heard. Central nervous system: Alert and oriented. No focal neurological deficits. Extremities: Trace bilateral pitting edema erythema to the right heel noted skin: No rashes, lesions or ulcers Psychiatry: Judgement and insight appear normal. Mood & affect appropriate.     Data Reviewed: I have personally reviewed following labs and imaging studies  CBC: Recent Labs  Lab 11/12/21 1420 11/12/21 1432 11/13/21 0241 11/13/21 1050 11/13/21 2011 11/14/21 0607  WBC 6.4  --   --   --   --  9.4  NEUTROABS 4.8  --   --   --   --  7.0  HGB  10.6* 11.2* 6.5* 8.9* 11.1* 10.7*  HCT 34.5* 33.0* 20.6* 26.6* 34.8* 31.7*  MCV 90.3  --   --   --   --  85.4  PLT 164  --   --   --   --  914*   Basic Metabolic Panel: Recent Labs  Lab 11/12/21 1420 11/12/21 1432 11/14/21 0607  NA 142 140 140  K 4.0 3.9 4.0  CL 107 107 106  CO2 27  --  24  GLUCOSE 116* 111* 103*  BUN 22 24* 22  CREATININE 0.97 1.00 1.00  CALCIUM 8.8*  --  8.1*  MG  --   --  1.6*  PHOS  --   --  2.6   GFR: Estimated Creatinine Clearance: 49.8 mL/min (by C-G formula based on SCr of 1 mg/dL). Liver Function Tests: Recent Labs  Lab 11/13/21 1050 11/14/21 0607  AST  --  53*  ALT  --  58*  ALKPHOS  --  65  BILITOT  --  0.8  PROT  --  4.6*  ALBUMIN 2.4* 2.3*   No results for input(s): LIPASE, AMYLASE in the last  168 hours. No results for input(s): AMMONIA in the last 168 hours. Coagulation Profile: Recent Labs  Lab 11/12/21 1420 11/13/21 2011  INR 1.6* 1.3*   Cardiac Enzymes: No results for input(s): CKTOTAL, CKMB, CKMBINDEX, TROPONINI in the last 168 hours. BNP (last 3 results) No results for input(s): PROBNP in the last 8760 hours. HbA1C: No results for input(s): HGBA1C in the last 72 hours. CBG: No results for input(s): GLUCAP in the last 168 hours. Lipid Profile: No results for input(s): CHOL, HDL, LDLCALC, TRIG, CHOLHDL, LDLDIRECT in the last 72 hours. Thyroid Function Tests: Recent Labs    11/14/21 0607  TSH 0.049*   Anemia Panel: No results for input(s): VITAMINB12, FOLATE, FERRITIN, TIBC, IRON, RETICCTPCT in the last 72 hours. Sepsis Labs: No results for input(s): PROCALCITON, LATICACIDVEN in the last 168 hours.  Recent Results (from the past 240 hour(s))  Resp Panel by RT-PCR (Flu A&B, Covid) Nasopharyngeal Swab     Status: None   Collection Time: 11/12/21  2:06 PM   Specimen: Nasopharyngeal Swab; Nasopharyngeal(NP) swabs in vial transport medium  Result Value Ref Range Status   SARS Coronavirus 2 by RT PCR NEGATIVE NEGATIVE  Final    Comment: (NOTE) SARS-CoV-2 target nucleic acids are NOT DETECTED.  The SARS-CoV-2 RNA is generally detectable in upper respiratory specimens during the acute phase of infection. The lowest concentration of SARS-CoV-2 viral copies this assay can detect is 138 copies/mL. A negative result does not preclude SARS-Cov-2 infection and should not be used as the sole basis for treatment or other patient management decisions. A negative result may occur with  improper specimen collection/handling, submission of specimen other than nasopharyngeal swab, presence of viral mutation(s) within the areas targeted by this assay, and inadequate number of viral copies(<138 copies/mL). A negative result must be combined with clinical observations, patient history, and epidemiological information. The expected result is Negative.  Fact Sheet for Patients:  EntrepreneurPulse.com.au  Fact Sheet for Healthcare Providers:  IncredibleEmployment.be  This test is no t yet approved or cleared by the [place] FDA and  has been authorized for detection and/or diagnosis of SARS-CoV-2 by FDA under an Emergency Use Authorization (EUA). This EUA will remain  in effect (meaning this test can be used) for the duration of the COVID-19 declaration under Section 564(b)(1) of the Act, 21 U.S.C.section 360bbb-3(b)(1), unless the authorization is terminated  or revoked sooner.       Influenza A by PCR NEGATIVE NEGATIVE Final   Influenza B by PCR NEGATIVE NEGATIVE Final    Comment: (NOTE) The Xpert Xpress SARS-CoV-2/FLU/RSV plus assay is intended as an aid in the diagnosis of influenza from Nasopharyngeal swab specimens and should not be used as a sole basis for treatment. Nasal washings and aspirates are unacceptable for Xpert Xpress SARS-CoV-2/FLU/RSV testing.  Fact Sheet for Patients: EntrepreneurPulse.com.au  Fact Sheet for Healthcare  Providers: IncredibleEmployment.be  This test is not yet approved or cleared by the [place] FDA and has been authorized for detection and/or diagnosis of SARS-CoV-2 by FDA under an Emergency Use Authorization (EUA). This EUA will remain in effect (meaning this test can be used) for the duration of the COVID-19 declaration under Section 564(b)(1) of the Act, 21 U.S.C. section 360bbb-3(b)(1), unless the authorization is terminated or revoked.  Performed at [facility], [address] [place], [place] [address]   Urine Culture     Status: Abnormal (Preliminary result)   Collection Time: 11/12/21  8:28 PM   Specimen: Urine, Clean  Catch  Result Value Ref Range Status   Specimen Description URINE, CLEAN CATCH  Final   Special Requests   Final    NONE Performed at [facility], [address] [place], [place] [address]    Culture >=100,000 COLONIES/mL GRAM NEGATIVE RODS (A)  Final   Report Status PENDING  Incomplete         Radiology Studies: CT Head Wo Contrast  Result Date: 11/12/2021 CLINICAL DATA:  Head trauma, moderate-severe EXAM: CT HEAD WITHOUT CONTRAST TECHNIQUE: Contiguous axial images were obtained from the base of the skull through the vertex without intravenous contrast. RADIATION DOSE REDUCTION: This exam was performed according to the departmental dose-optimization program which includes automated exposure control, adjustment of the mA and/or kV according to patient size and/or use of iterative reconstruction technique. COMPARISON:  03/15/2021 FINDINGS: Brain: No evidence of acute infarction, hemorrhage, hydrocephalus, extra-axial collection or mass lesion/mass effect. Small focus of encephalomalacia within the left caudate head at site of previously seen hemorrhage. Moderate-advanced low-density changes within the periventricular and subcortical white matter compatible with chronic microvascular ischemic change. Mild diffuse cerebral  volume loss. Vascular: Atherosclerotic calcifications involving the large vessels of the skull base. No unexpected hyperdense vessel. Skull: Normal. Negative for fracture or focal lesion. Sinuses/Orbits: No acute finding. Other: None. IMPRESSION: 1. No acute intracranial abnormality. 2. Chronic microvascular ischemic disease. Electronically Signed   By: Davina Poke D.O.   On: 11/12/2021 16:25   CT CHEST WO CONTRAST  Result Date: 11/14/2021 CLINICAL DATA:  76 year old female with history of pleural effusion. EXAM: CT CHEST WITHOUT CONTRAST TECHNIQUE: Multidetector CT imaging of the chest was performed following the standard protocol without IV contrast. RADIATION DOSE REDUCTION: This exam was performed according to the departmental dose-optimization program which includes automated exposure control, adjustment of the mA and/or kV according to patient size and/or use of iterative reconstruction technique. COMPARISON:  Chest CT 03/14/2021. FINDINGS: Cardiovascular: Heart size is normal. There is no significant pericardial fluid, thickening or pericardial calcification. There is aortic atherosclerosis, as well as atherosclerosis of the great vessels of the mediastinum and the coronary arteries, including calcified atherosclerotic plaque in the left main, left anterior descending, left circumflex and right coronary arteries. Severe calcifications of the mitral annulus. Left-sided pacemaker/AICD with lead tips terminating in the right atrium and right ventricular apex. Mediastinum/Nodes: No pathologically enlarged mediastinal or hilar lymph nodes. Esophagus is unremarkable in appearance. No axillary lymphadenopathy. Lungs/Pleura: Moderate right pleural effusion increased slightly compared to the prior study. Trace left pleural effusion. Dependent opacities in the lungs bilaterally, indicative of areas of passive subsegmental atelectasis. There also some patchy areas of ground-glass attenuation, septal thickening,  thickening of the peribronchovascular interstitium, mild cylindrical bronchiectasis and regional architectural distortion most evident in the medial aspect of the right lung and medial aspect of the left lower lobe, similar to prior studies, presumably areas of chronic post infectious or inflammatory scarring. No acute consolidative airspace disease. No definite suspicious appearing pulmonary nodules or masses are noted. Upper Abdomen: LapBand in position. Calcified granulomas in the spleen. Low-attenuation lesion in segment 4A of the liver measuring 1.2 cm in diameter, incompletely characterized on today's non-contrast CT examination, but statistically likely to represent a cyst. High attenuation throughout the visualized hepatic parenchyma, concerning for hepatic iron deposition. Nonobstructive calculi in the left renal collecting system measuring up to 6 mm in the interpolar region. Musculoskeletal: Status post right shoulder arthroplasty. There are no aggressive appearing lytic or blastic lesions noted in the  visualized portions of the skeleton. IMPRESSION: 1. Moderate right pleural effusion increased slightly compared to the prior study. 2. Trace left pleural effusion lying dependently. 3. Areas of post infectious or inflammatory scarring in the lungs bilaterally, similar to the prior study. Additional areas of passive subsegmental atelectasis are noted dependently in the lower lobes of the lungs. 4. Aortic atherosclerosis, in addition to left main and three-vessel coronary artery disease. Assessment for potential risk factor modification, dietary therapy or pharmacologic therapy may be warranted, if clinically indicated. 5. There are calcifications of the mitral annulus. Echocardiographic correlation for evaluation of potential valvular dysfunction may be warranted if clinically indicated. 6. Numerous nonobstructive calculi in the collecting system of the left kidney measuring up to 6 mm in the interpolar  region. Aortic Atherosclerosis (ICD10-I70.0). Electronically Signed   By: Vinnie Langton M.D.   On: 11/14/2021 08:15   CT Cervical Spine Wo Contrast  Result Date: 11/12/2021 CLINICAL DATA:  Neck trauma in a 76 year old female. EXAM: CT CERVICAL SPINE WITHOUT CONTRAST TECHNIQUE: Multidetector CT imaging of the cervical spine was performed without intravenous contrast. Multiplanar CT image reconstructions were also generated. RADIATION DOSE REDUCTION: This exam was performed according to the departmental dose-optimization program which includes automated exposure control, adjustment of the mA and/or kV according to patient size and/or use of iterative reconstruction technique. COMPARISON:  December 15, 2020. FINDINGS: Alignment: Normal. Skull base and vertebrae: No acute fracture. No primary bone lesion or focal pathologic process. Soft tissues and spinal canal: No prevertebral fluid or swelling. No visible canal hematoma. Disc levels: Multilevel degenerative changes with small to moderate anterior osteophytes greatest at C5-6. Moderate to marked uncovertebral spurring with some neural foraminal narrowing at C5-6 and C6-7 greatest on the RIGHT. Upper chest: Small RIGHT-sided effusion in the upper RIGHT chest measures water density but is incompletely evaluated. Incidental imaging of upper chest is otherwise unremarkable. Other: None IMPRESSION: 1. No acute fracture or static subluxation of the cervical spine. 2. Multilevel degenerative changes with some neural foraminal narrowing at C5-6 and C6-7 greatest on the RIGHT. 3. Small RIGHT-sided effusion in the upper RIGHT chest at the apex measures water density but is incompletely evaluated. Previously noted to have a RIGHT-sided effusion in June of 2022 that did not track to the level of the apex. Electronically Signed   By: Zetta Bills M.D.   On: 11/12/2021 16:35   DG Pelvis Portable  Result Date: 11/12/2021 CLINICAL DATA:  Level 2 trauma, fall, pain EXAM:  PORTABLE PELVIS 1-2 VIEWS COMPARISON:  12/15/2020 FINDINGS: Bones are osteopenic. Similar degenerative changes of the pelvis and both hips. No malalignment or acute osseous finding. Bony pelvis and hips appear symmetric and intact. Gastric banding hardware noted. Aortoiliac and femoral atherosclerosis present. IMPRESSION: No acute osseous finding. Electronically Signed   By: Jerilynn Mages.  Shick M.D.   On: 11/12/2021 14:26   DG Chest Portable 1 View  Result Date: 11/12/2021 CLINICAL DATA:  Level 2 trauma, fall EXAM: PORTABLE CHEST 1 VIEW COMPARISON:  12/15/2020 FINDINGS: Stable postoperative changes including left subclavian 2 lead pacer, left atrial appendage clip, and right shoulder reverse arthroplasty. Stable heart size and vascularity. Minor right base atelectasis or scarring. No focal pneumonia, collapse or consolidation. No large effusion or pneumothorax. Trachea midline. Aorta atherosclerotic. IMPRESSION: Stable chronic postoperative findings. Right basilar atelectasis versus scarring. Aortic Atherosclerosis (ICD10-I70.0). Electronically Signed   By: Jerilynn Mages.  Shick M.D.   On: 11/12/2021 14:24   ECHOCARDIOGRAM COMPLETE  Result Date: 11/13/2021    ECHOCARDIOGRAM REPORT  Patient Name:   Kayla Harrison Date of Exam: 11/13/2021 Medical Rec #:  [id number]           Height:       66.0 in Accession #:    [id number]          Weight:       200.0 lb Date of Birth:  03-Mar-1946            BSA:          2.000 m Patient Age:    55 years            BP:           81/59 mmHg Patient Gender: F                   HR:           128 bpm. Exam Location:  Inpatient Procedure: 2D Echo, Cardiac Doppler and Color Doppler Indications:    Syncope  History:        Patient has prior history of Echocardiogram examinations, most                 recent 10/04/2021. Stroke, Arrythmias:Atrial Fibrillation; Risk                 Factors:Dyslipidemia and Hypertension. 03/09/2021 cardioversion                 07/21/2019 cath.  Sonographer:    Luisa Hart  RDCS Referring Phys: [id number] [place]  1. Left ventricular ejection fraction, by estimation, is 55 to 60%. The left ventricle has normal function. The left ventricle has no regional wall motion abnormalities. Indeterminate diastolic filling due to E-A fusion. There is the interventricular septum is flattened in diastole ('D' shaped left ventricle), consistent with right ventricular volume overload.  2. Right ventricular systolic function is mildly reduced. The right ventricular size is mildly enlarged. There is mildly elevated pulmonary artery systolic pressure.  3. Left atrial size was moderately dilated.  4. The mitral valve is degenerative. Moderate mitral valve regurgitation. Mild mitral stenosis. The mean mitral valve gradient is 5.0 mmHg with average heart rate of 122 bpm. Severe mitral annular calcification.  5. Tricuspid valve regurgitation is severe.  6. The aortic valve is normal in structure. There is mild calcification of the aortic valve. There is mild thickening of the aortic valve. Aortic valve regurgitation is not visualized. Aortic valve sclerosis is present, with no evidence of aortic valve stenosis.  7. The inferior vena cava is dilated in size with >50% respiratory variability, suggesting right atrial pressure of 8 mmHg. Comparison(s): Prior images reviewed side by side. Mitral insufficiency appears to be less severe. FINDINGS  Left Ventricle: Left ventricular ejection fraction, by estimation, is 55 to 60%. The left ventricle has normal function. The left ventricle has no regional wall motion abnormalities. The left ventricular internal cavity size was normal in size. There is  no left ventricular hypertrophy. The interventricular septum is flattened in diastole ('D' shaped left ventricle), consistent with right ventricular volume overload. Indeterminate diastolic filling due to E-A fusion. Right Ventricle: The right ventricular size is mildly enlarged. No increase in right  ventricular wall thickness. Right ventricular systolic function is mildly reduced. There is mildly elevated pulmonary artery systolic pressure. The tricuspid regurgitant  velocity is 2.92 m/s, and with an assumed right atrial pressure of 8 mmHg, the estimated right ventricular systolic pressure is 30.8 mmHg. Left Atrium: Left atrial size was  moderately dilated. Right Atrium: Right atrial size was normal in size. Pericardium: There is no evidence of pericardial effusion. Mitral Valve: The mitral valve is degenerative in appearance. Severe mitral annular calcification. Moderate mitral valve regurgitation, with centrally-directed jet. Mild mitral valve stenosis. MV peak gradient, 8.9 mmHg. The mean mitral valve gradient is  5.0 mmHg with average heart rate of 122 bpm. Tricuspid Valve: The tricuspid valve is grossly normal. Tricuspid valve regurgitation is severe. Aortic Valve: The aortic valve is normal in structure. There is mild calcification of the aortic valve. There is mild thickening of the aortic valve. Aortic valve regurgitation is not visualized. Aortic valve sclerosis is present, with no evidence of aortic valve stenosis. Aortic valve mean gradient measures 5.2 mmHg. Aortic valve peak gradient measures 10.2 mmHg. Aortic valve area, by VTI measures 1.64 cm. Pulmonic Valve: The pulmonic valve was grossly normal. Pulmonic valve regurgitation is not visualized. Aorta: The aortic root is normal in size and structure. Venous: The inferior vena cava is dilated in size with greater than 50% respiratory variability, suggesting right atrial pressure of 8 mmHg. The inferior vena cava and the hepatic vein show a pattern of systolic flow reversal, suggestive of tricuspid regurgitation. IAS/Shunts: There is redundancy of the interatrial septum. No atrial level shunt detected by color flow Doppler. Additional Comments: A device lead is visualized in the right ventricle.  LEFT VENTRICLE PLAX 2D LVIDd:         4.60 cm LVIDs:          3.20 cm LV PW:         1.00 cm LV IVS:        0.90 cm LVOT diam:     1.70 cm LV SV:         30 LV SV Index:   15 LVOT Area:     2.27 cm  RIGHT VENTRICLE RV Basal diam:  3.50 cm RV Mid diam:    1.60 cm LEFT ATRIUM              Index        RIGHT ATRIUM           Index LA diam:        4.30 cm  2.15 cm/m   RA Area:     14.10 cm LA Vol (A2C):   68.2 ml  34.10 ml/m  RA Volume:   29.50 ml  14.75 ml/m LA Vol (A4C):   107.0 ml 53.51 ml/m LA Biplane Vol: 86.5 ml  43.25 ml/m  AORTIC VALVE                     PULMONIC VALVE AV Area (Vmax):    1.34 cm      PV Vmax:       0.83 m/s AV Area (Vmean):   1.32 cm      PV Vmean:      60.233 cm/s AV Area (VTI):     1.64 cm      PV VTI:        0.116 m AV Vmax:           159.75 cm/s   PV Peak grad:  2.8 mmHg AV Vmean:          104.500 cm/s  PV Mean grad:  1.7 mmHg AV VTI:            0.185 m AV Peak Grad:      10.2 mmHg AV Mean Grad:  5.2 mmHg LVOT Vmax:         94.50 cm/s LVOT Vmean:        60.900 cm/s LVOT VTI:          0.134 m LVOT/AV VTI ratio: 0.72  AORTA Ao Root diam: 2.40 cm Ao Asc diam:  2.90 cm MITRAL VALVE                TRICUSPID VALVE MV Area VTI:  1.33 cm      TR Peak grad:   34.1 mmHg MV Peak grad: 8.9 mmHg      TR Vmax:        292.00 cm/s MV Mean grad: 5.0 mmHg MV Vmax:      1.49 m/s      SHUNTS MV Vmean:     107.0 cm/s    Systemic VTI:  0.13 m MV VTI:       0.23 m        Systemic Diam: 1.70 cm MR Peak grad: 76.4 mmHg MR Mean grad: 53.5 mmHg MR Vmax:      437.00 cm/s MR Vmean:     346.5 cm/s MV E velocity: 139.00 cm/s Sanda Klein MD Electronically signed by Sanda Klein MD Signature Date/Time: 11/13/2021/11:57:00 AM    Final    CT Renal Stone Study  Result Date: 11/12/2021 CLINICAL DATA:  Nephrolithiasis. EXAM: CT ABDOMEN AND PELVIS WITHOUT CONTRAST TECHNIQUE: Multidetector CT imaging of the abdomen and pelvis was performed following the standard protocol without IV contrast. RADIATION DOSE REDUCTION: This exam was performed according to the  departmental dose-optimization program which includes automated exposure control, adjustment of the mA and/or kV according to patient size and/or use of iterative reconstruction technique. COMPARISON:  CT abdomen dated 07/18/2020. FINDINGS: Lower chest: RIGHT pleural effusion, small to moderate in size, incompletely imaged. Hepatobiliary: Liver is hyperdense. No focal mass or lesion is seen within the liver. Status post cholecystectomy. No significant bile duct dilatation appreciated. Pancreas: Unremarkable. No pancreatic ductal dilatation or surrounding inflammatory changes. Spleen: Unremarkable. Adrenals/Urinary Tract: Adrenal glands are unremarkable. Multiple LEFT renal stones, largest measuring 7 mm. No hydronephrosis. Several punctate RIGHT renal stones. No hydronephrosis. No ureteral or bladder calculi are identified. Marked amount of air within the bladder, including throughout the bladder walls, compatible with emphysematous cystitis. Stomach/Bowel: No dilated large or small bowel loops. No evidence of bowel wall inflammation. Lap band in place. Stomach otherwise unremarkable. Scattered diverticulosis of the descending and sigmoid colon but no focal inflammatory change to suggest acute diverticulitis. Vascular/Lymphatic: Aortic atherosclerosis. No abdominal aortic aneurysm. No enlarged lymph nodes seen in the abdomen or pelvis. Reproductive: Presumed hysterectomy. No adnexal mass or free fluid seen. Other: No free fluid or abscess collection is seen in the abdomen or pelvis. No free intraperitoneal air seen. Musculoskeletal: Degenerative spondylosis of the thoracolumbar spine, mild to moderate in degree. No acute-appearing osseous abnormality. Heterogeneously dense mass-like structure involving the RIGHT gluteus musculature, extending into the overlying subcutaneous soft tissues, measuring approximately 15 x 9 x 11 cm, presumably a large hematoma, less likely abscess or neoplastic mass. IMPRESSION: 1. Marked  amount of air within the bladder, including throughout the bladder walls, compatible with a severe emphysematous cystitis. 2. Very large mass-like structure involving the RIGHT gluteus musculature, extending into the overlying subcutaneous soft tissues, measuring approximately 15 x 9 x 11 cm, presumably a large hematoma, less likely abscess or neoplastic mass. Ordering physician states that patient has had a recent fall, therefore, this is consistent with a  large hematoma. 3. Bilateral nephrolithiasis. No hydronephrosis. No ureteral or bladder calculi are identified. 4. Colonic diverticulosis without evidence of acute diverticulitis. 5. RIGHT pleural effusion, small to moderate in size, incompletely imaged. 6. Liver is hyperdense suggesting hemochromatosis. These results were called by telephone at the time of interpretation on 11/12/2021 at 8:27 pm to provider [facility] , who verbally acknowledged these results. Electronically Signed   By: Franki Cabot M.D.   On: 11/12/2021 20:28   CT NO CHARGE  Result Date: 11/12/2021 CLINICAL DATA:  Trauma, fall, pain EXAM: CT ADDITIONAL VIEWS AT NO CHARGE TECHNIQUE: Axial, coronal and sagittal images of right hip were processed from the CT renal stone study. CONTRAST:  None COMPARISON:  None. FINDINGS: No recent fracture is seen in the right hip. There is no dislocation. There is large area of subcutaneous contusion/hematoma in the right gluteal region. There is 9.9 x 7 cm hematoma in the right gluteal muscles. There is another 10.2 x 5.6 cm hematoma in the subcutaneous plane. There is no significant effusion in the right hip joint. Extensive arterial calcifications are seen. There is possible peritoneal dialysis catheter in the lower abdomen. There is extensive pneumatosis in the bladder wall. There is air in the lumen of urinary bladder. Large amount of stool is seen in the rectosigmoid. IMPRESSION: No recent fracture or dislocation is seen in the right hip. There are  large hematomas in the right gluteal muscles and in the subcutaneous plane in the right gluteal region extending to the posterior upper right thigh. There is 9.9 x 7 cm hematoma in the right gluteus maximus. There is 10.2 x 5.6 cm subcutaneous hematoma posterior to the gluteal muscles. In addition, there is extensive subcutaneous edema posterior to the gluteal and subcutaneous hematomas suggesting contusion extending from the right gluteal region and along the posterior right upper thigh. There is extensive pneumatosis in the urinary bladder wall. There is air in the lumen of urinary bladder. These findings may suggest recent intervention or infectious process such as emphysematous cystitis. Electronically Signed   By: Elmer Picker M.D.   On: 11/12/2021 20:26        Scheduled Meds:  amiodarone  200 mg Oral BID   feeding supplement  237 mL Oral TID BM   levothyroxine  150 mcg Oral Q0600   midodrine  5 mg Oral TID WC   Continuous Infusions:  sodium chloride 50 mL/hr at 11/13/21 2127   cefTRIAXone (ROCEPHIN)  IV       LOS: 2 days    Time spent: 44 minutes    Georgette Shell, MD2/22/2023, 11:56 AM

## 2021-11-14 NOTE — Evaluation (Signed)
Physical Therapy Evaluation Patient Details Name: Kayla Harrison MRN: [id number] DOB: 12/09/1945 Today's Date: 11/14/2021  History of Present Illness  Pt is a 76 y/o F presenting to ED on 2/20 after 2 falls at home, found to be hypotensive. PMH includes HTN, CVA (2022), multiple falls, A fib on eliquis, pacemaker status, thyroid and breast CA.  Clinical Impression  Pt presents to PT with deficits in functional mobility, gait, balance, endurance, power, strength. Pt's greatest complaint is R buttock pain, noted to have large hematoma. Pt currently benefits from assistance with bed mobility, and demonstrates reduced tolerance for ambulation 2/2 pain and fatigue. Pt will benefit from aggressive mobilization to aide in improving activity tolerance and reducing falls risk. Pt is unable to identify consistent caregiver support at this time and remains at an increased risk for falls. PT recommends SNF placement due to these factors.       Recommendations for follow up therapy are one component of a multi-disciplinary discharge planning process, led by the attending physician.  Recommendations may be updated based on patient status, additional functional criteria and insurance authorization.  Follow Up Recommendations Skilled nursing-short term rehab (<3 hours/day)    Assistance Recommended at Discharge Intermittent Supervision/Assistance  Patient can return home with the following  A little help with walking and/or transfers;A little help with bathing/dressing/bathroom;Assistance with cooking/housework    Equipment Recommendations BSC/3in1  Recommendations for Other Services       Functional Status Assessment Patient has had a recent decline in their functional status and demonstrates the ability to make significant improvements in function in a reasonable and predictable amount of time.     Precautions / Restrictions Precautions Precautions: Fall Restrictions Weight Bearing Restrictions:  No      Mobility  Bed Mobility Overal bed mobility: Needs Assistance Bed Mobility: Rolling, Sidelying to Sit Rolling: Min guard Sidelying to sit: Min assist       General bed mobility comments: use of rails    Transfers Overall transfer level: Needs assistance Equipment used: Rolling walker (2 wheels) Transfers: Sit to/from Stand Sit to Stand: Min guard, From elevated surface                Ambulation/Gait Ambulation/Gait assistance: Min guard Gait Distance (Feet): 8 Feet Assistive device: Rolling walker (2 wheels) Gait Pattern/deviations: Step-to pattern Gait velocity: reduced Gait velocity interpretation: <1.8 ft/sec, indicate of risk for recurrent falls   General Gait Details: pt with slowed step-to gait, reduced foot clearance  Stairs            Wheelchair Mobility    Modified Rankin (Stroke Patients Only)       Balance Overall balance assessment: Needs assistance Sitting-balance support: No upper extremity supported, Feet supported Sitting balance-Leahy Scale: Fair     Standing balance support: Bilateral upper extremity supported, Reliant on assistive device for balance Standing balance-Leahy Scale: Poor                               Pertinent Vitals/Pain Pain Assessment Pain Assessment: 0-10 Pain Score: 8  Pain Location: buttocks Pain Descriptors / Indicators: Aching Pain Intervention(s): Monitored during session    Home Living Family/patient expects to be discharged to:: Private residence Living Arrangements: Alone Available Help at Discharge: Other (Comment) (pt reports having hired Designer, fashion/clothing, unable to identify other caregivers) Type of Home: Apartment Home Access: Level entry       [place]: One level  Home Equipment: Grab bars - toilet;Grab bars - tub/shower;Rollator (4 wheels);Shower seat - built in;Cane - single Barista (2 wheels)      Prior Function Prior Level of Function  : Independent/Modified Independent;History of Falls (last six months)             Mobility Comments: has been using Rollator ADLs Comments: does IADLs     Hand Dominance   Dominant Hand: Right    Extremity/Trunk Assessment   Upper Extremity Assessment Upper Extremity Assessment: Generalized weakness    Lower Extremity Assessment Lower Extremity Assessment: Generalized weakness    Cervical / Trunk Assessment Cervical / Trunk Assessment: Kyphotic  Communication   Communication: No difficulties  Cognition Arousal/Alertness: Awake/alert Behavior During Therapy: WFL for tasks assessed/performed Overall Cognitive Status: Within Functional Limits for tasks assessed                                          General Comments General comments (skin integrity, edema, etc.): VSS on RA    Exercises     Assessment/Plan    PT Assessment Patient needs continued PT services  PT Problem List Decreased strength;Decreased activity tolerance;Decreased mobility;Decreased balance;Decreased safety awareness;Pain       PT Treatment Interventions Gait training;DME instruction;Functional mobility training;Therapeutic activities;Therapeutic exercise;Balance training;Neuromuscular re-education;Patient/family education    PT Goals (Current goals can be found in the Care Plan section)  Acute Rehab PT Goals Patient Stated Goal: to return to independent mobility, stop falling PT Goal Formulation: With patient Time For Goal Achievement: 11/28/21 Potential to Achieve Goals: Fair    Frequency Min 3X/week (maintain 3x for possible progression home)     Co-evaluation               AM-PAC PT "6 Clicks" Mobility  Outcome Measure Help needed turning from your back to your side while in a flat bed without using bedrails?: A Little Help needed moving from lying on your back to sitting on the side of a flat bed without using bedrails?: A Little Help needed moving to and  from a bed to a chair (including a wheelchair)?: A Little Help needed standing up from a chair using your arms (e.g., wheelchair or bedside chair)?: A Little Help needed to walk in hospital room?: Total Help needed climbing 3-5 steps with a railing? : Total 6 Click Score: 14    End of Session   Activity Tolerance: Patient tolerated treatment well Patient left: in chair;with call bell/phone within reach;with chair alarm set Nurse Communication: Mobility status PT Visit Diagnosis: Other abnormalities of gait and mobility (R26.89);History of falling (Z91.81)    Time: 8003-4917 PT Time Calculation (min) (ACUTE ONLY): 43 min   Charges:   PT Evaluation $PT Eval Low Complexity: 1 Low PT Treatments $Therapeutic Activity: 8-22 mins        Zenaida Niece, PT, DPT Acute Rehabilitation Pager: [phone] Office [phone]   Zenaida Niece 11/14/2021, 12:27 PM

## 2021-11-15 DIAGNOSIS — I951 Orthostatic hypotension: Secondary | ICD-10-CM | POA: Diagnosis not present

## 2021-11-15 LAB — CBC WITH DIFFERENTIAL/PLATELET
Abs Immature Granulocytes: 0.04 10*3/uL (ref 0.00–0.07)
Basophils Absolute: 0.1 10*3/uL (ref 0.0–0.1)
Basophils Relative: 1 %
Eosinophils Absolute: 0.3 10*3/uL (ref 0.0–0.5)
Eosinophils Relative: 5 %
HCT: 31.1 % — ABNORMAL LOW (ref 36.0–46.0)
Hemoglobin: 9.8 g/dL — ABNORMAL LOW (ref 12.0–15.0)
Immature Granulocytes: 1 %
Lymphocytes Relative: 8 %
Lymphs Abs: 0.6 10*3/uL — ABNORMAL LOW (ref 0.7–4.0)
MCH: 27.6 pg (ref 26.0–34.0)
MCHC: 31.5 g/dL (ref 30.0–36.0)
MCV: 87.6 fL (ref 80.0–100.0)
Monocytes Absolute: 1 10*3/uL (ref 0.1–1.0)
Monocytes Relative: 14 %
Neutro Abs: 5 10*3/uL (ref 1.7–7.7)
Neutrophils Relative %: 71 %
Platelets: 144 10*3/uL — ABNORMAL LOW (ref 150–400)
RBC: 3.55 MIL/uL — ABNORMAL LOW (ref 3.87–5.11)
RDW: 17.8 % — ABNORMAL HIGH (ref 11.5–15.5)
WBC: 6.9 10*3/uL (ref 4.0–10.5)
nRBC: 0 % (ref 0.0–0.2)

## 2021-11-15 LAB — COMPREHENSIVE METABOLIC PANEL
ALT: 49 U/L — ABNORMAL HIGH (ref 0–44)
AST: 47 U/L — ABNORMAL HIGH (ref 15–41)
Albumin: 2 g/dL — ABNORMAL LOW (ref 3.5–5.0)
Alkaline Phosphatase: 68 U/L (ref 38–126)
Anion gap: 6 (ref 5–15)
BUN: 18 mg/dL (ref 8–23)
CO2: 25 mmol/L (ref 22–32)
Calcium: 7.9 mg/dL — ABNORMAL LOW (ref 8.9–10.3)
Chloride: 108 mmol/L (ref 98–111)
Creatinine, Ser: 0.81 mg/dL (ref 0.44–1.00)
GFR, Estimated: >60 mL/min (ref >60)
Glucose, Bld: 102 mg/dL — ABNORMAL HIGH (ref 70–99)
Potassium: 4.1 mmol/L (ref 3.5–5.1)
Sodium: 139 mmol/L (ref 135–145)
Total Bilirubin: 0.6 mg/dL (ref 0.3–1.2)
Total Protein: 4.3 g/dL — ABNORMAL LOW (ref 6.5–8.1)

## 2021-11-15 LAB — URINE CULTURE: Culture: >=100000 — AB

## 2021-11-15 LAB — MAGNESIUM: Magnesium: 1.8 mg/dL (ref 1.7–2.4)

## 2021-11-15 LAB — PHOSPHORUS: Phosphorus: 2.5 mg/dL (ref 2.5–4.6)

## 2021-11-15 MED ORDER — NITROFURANTOIN MONOHYD MACRO 100 MG PO CAPS
100.0000 mg | ORAL_CAPSULE | Freq: Two times a day (BID) | ORAL | Status: DC
  Administered 2021-11-15 – 2021-11-20 (×11): 100 mg via ORAL
  Filled 2021-11-15 (×12): qty 1

## 2021-11-15 MED ORDER — ALBUMIN HUMAN 25 % IV SOLN
12.5000 g | Freq: Once | INTRAVENOUS | Status: AC
  Administered 2021-11-15: 12.5 g via INTRAVENOUS
  Filled 2021-11-15: qty 50

## 2021-11-15 NOTE — Progress Notes (Signed)
Inpatient Rehabilitation Admissions Coordinator   Per [facility] SW patient is requesting CIR consult. I will place order for full assessment per patient request. She was admitted  to CIR in 2022. An Admissions Coordinator will follow up for full assessment.  Danne Baxter, RN, MSN Rehab Admissions Coordinator [phone] 11/15/2021 4:37 PM

## 2021-11-15 NOTE — Progress Notes (Signed)
Physical Therapy Treatment Patient Details Name: Kayla Harrison MRN: [id number] DOB: [birth year] Today's Date: 11/15/2021   History of Present Illness Pt is a 76 y/o F presenting to ED on 2/20 after 2 falls at home, found to be hypotensive. PMH includes HTN, CVA (2022), multiple falls, A fib on eliquis, pacemaker status, thyroid and breast CA.    PT Comments    Pt tolerates treatment well, reporting some improvement in R buttock pain this session although still very uncomfortable. Pt continues to require physical assistance to perform bed mobility and transfers. Pt is able to ambulate for increased distances and will benefit from ambulating to the bathroom when needed with assistance of staff. PT continues to recommend SNF placement due to increased risk for falls and poor caregiver support.   Recommendations for follow up therapy are one component of a multi-disciplinary discharge planning process, led by the attending physician.  Recommendations may be updated based on patient status, additional functional criteria and insurance authorization.  Follow Up Recommendations  Skilled nursing-short term rehab (<3 hours/day)     Assistance Recommended at Discharge Intermittent Supervision/Assistance  Patient can return home with the following A little help with walking and/or transfers;A little help with bathing/dressing/bathroom;Assistance with cooking/housework   Equipment Recommendations  BSC/3in1    Recommendations for Other Services       Precautions / Restrictions Precautions Precautions: Fall Restrictions Weight Bearing Restrictions: No     Mobility  Bed Mobility Overal bed mobility: Needs Assistance Bed Mobility: Rolling, Sidelying to Sit Rolling: Min guard Sidelying to sit: Min assist       General bed mobility comments: use of rails    Transfers Overall transfer level: Needs assistance Equipment used: Rolling walker (2 wheels) Transfers: Sit to/from Stand Sit  to Stand: Min assist           General transfer comment: cues for increased trunk flexion, hand placement, rocking for momentum    Ambulation/Gait Ambulation/Gait assistance: Min guard Gait Distance (Feet): 15 Feet (15' x 2, seated rest break due to fatigue) Assistive device: Rolling walker (2 wheels) Gait Pattern/deviations: Step-through pattern Gait velocity: reduced Gait velocity interpretation: <1.8 ft/sec, indicate of risk for recurrent falls   General Gait Details: pt with slowed step-through gait, reduced stride length   Stairs             Wheelchair Mobility    Modified Rankin (Stroke Patients Only)       Balance Overall balance assessment: Needs assistance Sitting-balance support: No upper extremity supported, Feet supported Sitting balance-Leahy Scale: Fair     Standing balance support: Bilateral upper extremity supported, Reliant on assistive device for balance Standing balance-Leahy Scale: Poor                              Cognition Arousal/Alertness: Awake/alert Behavior During Therapy: WFL for tasks assessed/performed Overall Cognitive Status: Within Functional Limits for tasks assessed                                          Exercises      General Comments General comments (skin integrity, edema, etc.): VSS on RA      Pertinent Vitals/Pain Pain Assessment Pain Assessment: Faces Faces Pain Scale: Hurts even more Pain Location: buttocks Pain Descriptors / Indicators: Sore Pain Intervention(s): Monitored during session    Home  Living                          Prior Function            PT Goals (current goals can now be found in the care plan section) Acute Rehab PT Goals Patient Stated Goal: to return to independent mobility, stop falling Progress towards PT goals: Progressing toward goals    Frequency    Min 3X/week (maintain 3x until SNF confirmed)      PT Plan Current plan  remains appropriate    Co-evaluation              AM-PAC PT "6 Clicks" Mobility   Outcome Measure  Help needed turning from your back to your side while in a flat bed without using bedrails?: A Little Help needed moving from lying on your back to sitting on the side of a flat bed without using bedrails?: A Little Help needed moving to and from a bed to a chair (including a wheelchair)?: A Little Help needed standing up from a chair using your arms (e.g., wheelchair or bedside chair)?: A Little Help needed to walk in hospital room?: Total Help needed climbing 3-5 steps with a railing? : Total 6 Click Score: 14    End of Session   Activity Tolerance: Patient limited by fatigue Patient left: in chair;with call bell/phone within reach;with chair alarm set Nurse Communication: Mobility status PT Visit Diagnosis: Other abnormalities of gait and mobility (R26.89);History of falling (Z91.81)     Time: 8590-9311 PT Time Calculation (min) (ACUTE ONLY): 32 min  Charges:  $Gait Training: 8-22 mins $Therapeutic Activity: 8-22 mins                     Zenaida Niece, PT, DPT Acute Rehabilitation Pager: [phone] Office Finzel Odetta Forness 11/15/2021, 10:03 AM

## 2021-11-15 NOTE — Progress Notes (Signed)
Occupational Therapy Treatment Patient Details Name: Kayla Harrison MRN: [id number] DOB: Feb 25, 1946 Today's Date: 11/15/2021   History of present illness Pt is a 76 y/o F presenting to ED on 2/20 after 2 falls at home, found to be hypotensive. PMH includes HTN, CVA (2022), multiple falls, A fib on eliquis, pacemaker status, thyroid and breast CA.   OT comments  Pt is making slow, but steady progress toward goals.  She was able to transfer for [facility] with min guard assist, and performed UE exercises.  Pain 8/10.  She requries max A for LB ADLs and min A for bed mobility.    Recommendations for follow up therapy are one component of a multi-disciplinary discharge planning process, led by the attending physician.  Recommendations may be updated based on patient status, additional functional criteria and insurance authorization.    Follow Up Recommendations  Skilled nursing-short term rehab (<3 hours/day)    Assistance Recommended at Discharge Intermittent Supervision/Assistance  Patient can return home with the following  A little help with walking and/or transfers;A lot of help with bathing/dressing/bathroom;Assistance with cooking/housework;Assist for transportation;Help with stairs or ramp for entrance   Equipment Recommendations  None recommended by OT    Recommendations for Other Services      Precautions / Restrictions Precautions Precautions: Fall       Mobility Bed Mobility Overal bed mobility: Needs Assistance Bed Mobility: Sit to Supine         Sit to sidelying: Min assist General bed mobility comments: assist to lift LEs back onto bed    Transfers Overall transfer level: Needs assistance Equipment used: Rolling walker (2 wheels) Transfers: Sit to/from Stand, Bed to chair/wheelchair/BSC Sit to Stand: Min guard Stand pivot transfers: Min guard               Balance Overall balance assessment: Needs assistance Sitting-balance support: No upper extremity  supported, Feet supported Sitting balance-Leahy Scale: Fair     Standing balance support: Bilateral upper extremity supported, Reliant on assistive device for balance Standing balance-Leahy Scale: Poor                             ADL either performed or assessed with clinical judgement   ADL Overall ADL's : Needs assistance/impaired Eating/Feeding: Set up;Sitting                       Toilet Transfer: Min guard;Stand-pivot;BSC/3in1;Rolling walker (2 wheels)   Toileting- Clothing Manipulation and Hygiene: Maximal assistance;Sit to/from stand       Functional mobility during ADLs: Min guard;Rolling walker (2 wheels)      Extremity/Trunk Assessment Upper Extremity Assessment Upper Extremity Assessment: Generalized weakness   Lower Extremity Assessment Lower Extremity Assessment: Generalized weakness        Vision       Perception     Praxis      Cognition Arousal/Alertness: Awake/alert Behavior During Therapy: WFL for tasks assessed/performed Overall Cognitive Status: Within Functional Limits for tasks assessed                                          Exercises Exercises: General Upper Extremity General Exercises - Upper Extremity Chair Push Up: Both, 10 reps, Seated    Shoulder Instructions       General Comments      Pertinent  Vitals/ Pain       Pain Assessment Pain Assessment: 0-10 Pain Score: 8  Pain Location: buttocks Pain Descriptors / Indicators: Sore Pain Intervention(s): Monitored during session, Repositioned, Limited activity within patient's tolerance  Home Living                                          Prior Functioning/Environment              Frequency  Min 2X/week        Progress Toward Goals  OT Goals(current goals can now be found in the care plan section)  Progress towards OT goals: Progressing toward goals     Plan Discharge plan remains appropriate     Co-evaluation                 AM-PAC OT "6 Clicks" Daily Activity     Outcome Measure   Help from another person eating meals?: None Help from another person taking care of personal grooming?: A Little Help from another person toileting, which includes using toliet, bedpan, or urinal?: A Lot Help from another person bathing (including washing, rinsing, drying)?: A Lot Help from another person to put on and taking off regular upper body clothing?: A Lot Help from another person to put on and taking off regular lower body clothing?: A Lot 6 Click Score: 15    End of Session Equipment Utilized During Treatment: Rolling walker (2 wheels)  OT Visit Diagnosis: Unsteadiness on feet (R26.81);Other abnormalities of gait and mobility (R26.89);Muscle weakness (generalized) (M62.81);Repeated falls (R29.6);History of falling (Z91.81);Pain   Activity Tolerance Patient limited by pain;Patient limited by fatigue   Patient Left in bed;with call bell/phone within reach;with nursing/sitter in room   Nurse Communication Mobility status        Time: 0175-1025 OT Time Calculation (min): 15 min  Charges: OT General Charges $OT Visit: 1 Visit OT Treatments $Self Care/Home Management : 8-22 mins  Nilsa Nutting OTR/L Acute Rehabilitation Services Pager [phone] Office [phone]   Lucille Passy M 11/15/2021, 2:33 PM

## 2021-11-15 NOTE — Progress Notes (Signed)
PROGRESS NOTE    Kayla Harrison  LZJ:[id number] DOB: [birth year] DOA: 11/12/2021 PCP: [name], MD  Brief Narrative: 76 year old female lives at home alone with multiple falls.  She has history of atrial fibrillation heart block status post permanent pacemaker, chronic hypotension on midodrine Chest x-ray not suggestive of pneumonia, rib fractures, or pneumothorax.     Pelvic x-ray negative for acute finding.     CT head and C-spine negative for acute finding; showing small right-sided effusion in the upper right chest at the apex measures water density but is incompletely evaluated.  Previously noted to have a right-sided effusion in June 2022 that did not track to the level of the apex.   CT renal stone study showing marked volume of air within the bladder, including throughout the bladder walls, compatible with a severe emphysematous cystitis.  Showing a large right gluteal hematoma measuring approximately 15 x 9 x 11 cm.  Showing a small to moderate size right pleural effusion, incompletely imaged.  Hyperdense liver suspicious for hemochromatosis.   CT with additional views done: "No recent fracture or dislocation is seen in the right hip. There are large hematomas in the right gluteal muscles and in the subcutaneous plane in the right gluteal region extending to the posterior upper right thigh. There is 9.9 x 7 cm hematoma in the right gluteus maximus. There is 10.2 x 5.6 cm subcutaneous hematoma posterior to the gluteal muscles. In addition, there is extensive subcutaneous edema posterior to the gluteal and subcutaneous hematomas suggesting contusion extending from the right gluteal region and along the posterior right upper thigh."  Assessment & Plan:   Principal Problem:   Orthostatic hypotension Active Problems:   HYPERLIPIDEMIA   Pleural effusion   Hypothyroidism   Persistent atrial fibrillation ([place])   Fall   Traumatic hematoma of buttock   Emphysematous  cystitis   Abnormal finding on imaging of liver   History of fall   History of hypotension   Pressure ulcer with suspected deep tissue injury, stage II (HCC)   Prolonged QT interval   #1 orthostatic hypotension with syncope-her blood pressure was 58/32 with EMS.  Patient has had multiple falls at home prior to admission.  Pacemaker interrogated and shows no signs of arrhythmia. Blood pressure improved to 111/65. Patient on normal saline 50 cc an hour Cortisol 16.6 Status post albumin, albumin 2.0 from 2.4.  Will give more albumin infusion.  Echocardiogram -ejection fraction 55 to 60%.  No regional wall motion abnormalities.  No LVH. TSH 0.049  #2 chronic persistent atrial fibrillation with RVR -received metoprolol 5 mg IV x1 with improvement in the rate. on amiodarone 200 twice daily Eliquis on hold due to large right gluteal hematoma  #3 anemia due to traumatic hematoma to the right buttocks status post 2 units of packed RBC Her hemoglobin dropped to 6.5 and she received 2 units of packed RBC with appropriate response hemoglobin up to 9.8.  #4 emphysematous cystitis with patient complaining of dysuria -urine culture with ESBL E. coli.  Starting Effie 11/15/2021  #5 hypothyroidism on Synthroid TSH is 0.049 we will decrease the dose of Synthroid.  #6 hyperlipidemia holding statin due to elevated LFTs  #7 hypomagnesemia replete and recheck levels in a.m. magnesium 1.8.  #8?  Hemochromatosis of the liver CT with findings suspicious for hemochromatosis of the liver.  Will need outpatient follow-up.  #9 multiple falls lives alone PT recommends SNF.  #10 right buttock bruises/hematoma status post fall at home  #11  erythema to the right f heel keep legs elevated and floating  #12 elevated LFTs improving continue to hold statin.    Nutrition Problem: Inadequate oral intake Etiology: poor appetite   Signs/Symptoms: per patient/family report    Interventions: Refer to RD note  for recommendations  Estimated body mass index is 29.53 kg/m as calculated from the following:   Height as of this encounter: 5\' 6"  (1.676 m).   Weight as of this encounter: 83 kg.  DVT prophylaxis: None due to large hematoma of the right buttocks and multiple falls Code Status: Full code  family Communication: None at bedside  disposition Plan:  Status is: Inpatient Remains inpatient appropriate because: IV fluids PT eval pending multiple falls large buttock hematoma   Consultants:  None  Procedures: None Antimicrobials: Rocephin Subjective: Patient resting in bed in no acute distress complains of pain with movement.  Has large bruise on her right buttocks. Objective: Vitals:   11/14/21 2352 11/15/21 0403 11/15/21 0407 11/15/21 0740  BP: 99/68 108/63  111/65  Pulse: (!) 105 99  100  Resp: 18 17  16   Temp: 98.2 F (36.8 C) 97.8 F (36.6 C)  98.2 F (36.8 C)  TempSrc: Oral Oral  Oral  SpO2: 100% 100%  98%  Weight:   83 kg   Height:        Intake/Output Summary (Last 24 hours) at 11/15/2021 1311 Last data filed at 11/15/2021 0941 Gross per 24 hour  Intake 2609.73 ml  Output 500 ml  Net 2109.73 ml    Filed Weights   11/12/21 1416 11/13/21 1700 11/15/21 0407  Weight: 90.7 kg 78.6 kg 83 kg    Examination:  General exam: Appears frail elderly female Respiratory system: Clear to auscultation. Respiratory effort normal. Cardiovascular system: S1 & S2 heard, RRR. No JVD, murmurs, rubs, gallops or clicks. No pedal edema. Gastrointestinal system: Abdomen is nondistended, soft and nontender. No organomegaly or masses felt. Normal bowel sounds heard. Central nervous system: Alert and oriented. No focal neurological deficits. Extremities: Trace bilateral pitting edema erythema to the right heel noted skin: Right buttock bruises  psychiatry: Judgement and insight appear normal. Mood & affect appropriate.     Data Reviewed: I have personally reviewed following labs and  imaging studies  CBC: Recent Labs  Lab 11/12/21 1420 11/12/21 1432 11/13/21 0241 11/13/21 1050 11/13/21 2011 11/14/21 0607 11/15/21 0510  WBC 6.4  --   --   --   --  9.4 6.9  NEUTROABS 4.8  --   --   --   --  7.0 5.0  HGB 10.6*   < > 6.5* 8.9* 11.1* 10.7* 9.8*  HCT 34.5*   < > 20.6* 26.6* 34.8* 31.7* 31.1*  MCV 90.3  --   --   --   --  85.4 87.6  PLT 164  --   --   --   --  144* 144*   < > = values in this interval not displayed.    Basic Metabolic Panel: Recent Labs  Lab 11/12/21 1420 11/12/21 1432 11/14/21 0607 11/15/21 0510  NA 142 140 140 139  K 4.0 3.9 4.0 4.1  CL 107 107 106 108  CO2 27  --  24 25  GLUCOSE 116* 111* 103* 102*  BUN 22 24* 22 18  CREATININE 0.97 1.00 1.00 0.81  CALCIUM 8.8*  --  8.1* 7.9*  MG  --   --  1.6* 1.8  PHOS  --   --  2.6 2.5    GFR: Estimated Creatinine Clearance: 63.2 mL/min (by C-G formula based on SCr of 0.81 mg/dL). Liver Function Tests: Recent Labs  Lab 11/13/21 1050 11/14/21 0607 11/15/21 0510  AST  --  53* 47*  ALT  --  58* 49*  ALKPHOS  --  65 68  BILITOT  --  0.8 0.6  PROT  --  4.6* 4.3*  ALBUMIN 2.4* 2.3* 2.0*    No results for input(s): LIPASE, AMYLASE in the last 168 hours. No results for input(s): AMMONIA in the last 168 hours. Coagulation Profile: Recent Labs  Lab 11/12/21 1420 11/13/21 2011  INR 1.6* 1.3*    Cardiac Enzymes: No results for input(s): CKTOTAL, CKMB, CKMBINDEX, TROPONINI in the last 168 hours. BNP (last 3 results) No results for input(s): PROBNP in the last 8760 hours. HbA1C: No results for input(s): HGBA1C in the last 72 hours. CBG: No results for input(s): GLUCAP in the last 168 hours. Lipid Profile: No results for input(s): CHOL, HDL, LDLCALC, TRIG, CHOLHDL, LDLDIRECT in the last 72 hours. Thyroid Function Tests: Recent Labs    11/14/21 0607  TSH 0.049*    Anemia Panel: No results for input(s): VITAMINB12, FOLATE, FERRITIN, TIBC, IRON, RETICCTPCT in the last 72  hours. Sepsis Labs: No results for input(s): PROCALCITON, LATICACIDVEN in the last 168 hours.  Recent Results (from the past 240 hour(s))  Resp Panel by RT-PCR (Flu A&B, Covid) Nasopharyngeal Swab     Status: None   Collection Time: 11/12/21  2:06 PM   Specimen: Nasopharyngeal Swab; Nasopharyngeal(NP) swabs in vial transport medium  Result Value Ref Range Status   SARS Coronavirus 2 by RT PCR NEGATIVE NEGATIVE Final    Comment: (NOTE) SARS-CoV-2 target nucleic acids are NOT DETECTED.  The SARS-CoV-2 RNA is generally detectable in upper respiratory specimens during the acute phase of infection. The lowest concentration of SARS-CoV-2 viral copies this assay can detect is 138 copies/mL. A negative result does not preclude SARS-Cov-2 infection and should not be used as the sole basis for treatment or other patient management decisions. A negative result may occur with  improper specimen collection/handling, submission of specimen other than nasopharyngeal swab, presence of viral mutation(s) within the areas targeted by this assay, and inadequate number of viral copies(<138 copies/mL). A negative result must be combined with clinical observations, patient history, and epidemiological information. The expected result is Negative.  Fact Sheet for Patients:  EntrepreneurPulse.com.au  Fact Sheet for Healthcare Providers:  IncredibleEmployment.be  This test is no t yet approved or cleared by the [place] FDA and  has been authorized for detection and/or diagnosis of SARS-CoV-2 by FDA under an Emergency Use Authorization (EUA). This EUA will remain  in effect (meaning this test can be used) for the duration of the COVID-19 declaration under Section 564(b)(1) of the Act, 21 U.S.C.section 360bbb-3(b)(1), unless the authorization is terminated  or revoked sooner.       Influenza A by PCR NEGATIVE NEGATIVE Final   Influenza B by PCR NEGATIVE  NEGATIVE Final    Comment: (NOTE) The Xpert Xpress SARS-CoV-2/FLU/RSV plus assay is intended as an aid in the diagnosis of influenza from Nasopharyngeal swab specimens and should not be used as a sole basis for treatment. Nasal washings and aspirates are unacceptable for Xpert Xpress SARS-CoV-2/FLU/RSV testing.  Fact Sheet for Patients: EntrepreneurPulse.com.au  Fact Sheet for Healthcare Providers: IncredibleEmployment.be  This test is not yet approved or cleared by the [place] FDA and has been authorized for detection  and/or diagnosis of SARS-CoV-2 by FDA under an Emergency Use Authorization (EUA). This EUA will remain in effect (meaning this test can be used) for the duration of the COVID-19 declaration under Section 564(b)(1) of the Act, 21 U.S.C. section 360bbb-3(b)(1), unless the authorization is terminated or revoked.  Performed at [facility], [place] [address], [place], [place] [address]   Urine Culture     Status: Abnormal   Collection Time: 11/12/21  8:28 PM   Specimen: Urine, Clean Catch  Result Value Ref Range Status   Specimen Description URINE, CLEAN CATCH  Final   Special Requests   Final    NONE Performed at [facility], [place] [address] [place], [place] [address]    Culture (A)  Final    >=100,000 COLONIES/mL ESCHERICHIA COLI Confirmed Extended Spectrum Beta-Lactamase Producer (ESBL).  In bloodstream infections from ESBL organisms, carbapenems are preferred over piperacillin/tazobactam. They are shown to have a lower risk of mortality.    Report Status 11/15/2021 FINAL  Final   Organism ID, Bacteria ESCHERICHIA COLI (A)  Final      Susceptibility   Escherichia coli - MIC*    AMPICILLIN >=32 RESISTANT Resistant     CEFAZOLIN >=64 RESISTANT Resistant     CEFEPIME 16 RESISTANT Resistant     CEFTRIAXONE >=64 RESISTANT Resistant     CIPROFLOXACIN >=4 RESISTANT Resistant     GENTAMICIN <=1 SENSITIVE Sensitive      IMIPENEM <=0.25 SENSITIVE Sensitive     NITROFURANTOIN <=16 SENSITIVE Sensitive     TRIMETH/SULFA <=20 SENSITIVE Sensitive     AMPICILLIN/SULBACTAM 8 SENSITIVE Sensitive     PIP/TAZO <=4 SENSITIVE Sensitive     * >=100,000 COLONIES/mL ESCHERICHIA COLI          Radiology Studies: CT CHEST WO CONTRAST  Result Date: 11/14/2021 CLINICAL DATA:  76 year old female with history of pleural effusion. EXAM: CT CHEST WITHOUT CONTRAST TECHNIQUE: Multidetector CT imaging of the chest was performed following the standard protocol without IV contrast. RADIATION DOSE REDUCTION: This exam was performed according to the departmental dose-optimization program which includes automated exposure control, adjustment of the mA and/or kV according to patient size and/or use of iterative reconstruction technique. COMPARISON:  Chest CT 03/14/2021. FINDINGS: Cardiovascular: Heart size is normal. There is no significant pericardial fluid, thickening or pericardial calcification. There is aortic atherosclerosis, as well as atherosclerosis of the great vessels of the mediastinum and the coronary arteries, including calcified atherosclerotic plaque in the left main, left anterior descending, left circumflex and right coronary arteries. Severe calcifications of the mitral annulus. Left-sided pacemaker/AICD with lead tips terminating in the right atrium and right ventricular apex. Mediastinum/Nodes: No pathologically enlarged mediastinal or hilar lymph nodes. Esophagus is unremarkable in appearance. No axillary lymphadenopathy. Lungs/Pleura: Moderate right pleural effusion increased slightly compared to the prior study. Trace left pleural effusion. Dependent opacities in the lungs bilaterally, indicative of areas of passive subsegmental atelectasis. There also some patchy areas of ground-glass attenuation, septal thickening, thickening of the peribronchovascular interstitium, mild cylindrical bronchiectasis and regional  architectural distortion most evident in the medial aspect of the right lung and medial aspect of the left lower lobe, similar to prior studies, presumably areas of chronic post infectious or inflammatory scarring. No acute consolidative airspace disease. No definite suspicious appearing pulmonary nodules or masses are noted. Upper Abdomen: LapBand in position. Calcified granulomas in the spleen. Low-attenuation lesion in segment 4A of the liver measuring 1.2 cm in diameter, incompletely characterized on today's non-contrast CT  examination, but statistically likely to represent a cyst. High attenuation throughout the visualized hepatic parenchyma, concerning for hepatic iron deposition. Nonobstructive calculi in the left renal collecting system measuring up to 6 mm in the interpolar region. Musculoskeletal: Status post right shoulder arthroplasty. There are no aggressive appearing lytic or blastic lesions noted in the visualized portions of the skeleton. IMPRESSION: 1. Moderate right pleural effusion increased slightly compared to the prior study. 2. Trace left pleural effusion lying dependently. 3. Areas of post infectious or inflammatory scarring in the lungs bilaterally, similar to the prior study. Additional areas of passive subsegmental atelectasis are noted dependently in the lower lobes of the lungs. 4. Aortic atherosclerosis, in addition to left main and three-vessel coronary artery disease. Assessment for potential risk factor modification, dietary therapy or pharmacologic therapy may be warranted, if clinically indicated. 5. There are calcifications of the mitral annulus. Echocardiographic correlation for evaluation of potential valvular dysfunction may be warranted if clinically indicated. 6. Numerous nonobstructive calculi in the collecting system of the left kidney measuring up to 6 mm in the interpolar region. Aortic Atherosclerosis (ICD10-I70.0). Electronically Signed   By: Vinnie Langton M.D.   On:  11/14/2021 08:15        Scheduled Meds:  amiodarone  200 mg Oral BID   feeding supplement  237 mL Oral TID BM   levothyroxine  125 mcg Oral Q0600   midodrine  5 mg Oral TID WC   nitrofurantoin (macrocrystal-monohydrate)  100 mg Oral Q12H   Continuous Infusions:  sodium chloride 50 mL/hr at 11/15/21 0534     LOS: 3 days    Time spent: 44 minutes    Georgette Shell, MD2/23/2023, 1:11 PM

## 2021-11-15 NOTE — Progress Notes (Signed)
Mobility Specialist Progress Note:   11/15/21 1151  Mobility  Bed Position Chair  Activity Ambulated with assistance in room  Level of Assistance Standby assist, set-up cues, supervision of patient - no hands on  Assistive Device Front wheel walker  Distance Ambulated (ft) 6 ft  Activity Response Tolerated well  $Mobility charge 1 Mobility   Pt received in bed willing to participate in mobility. Complaints of R hip pain. Pt was able to stand without assistance from elevated bed. Left in chair with call bell in reach and all needs met.   [facility] Public librarian Phone [phone]

## 2021-11-15 NOTE — NC FL2 (Signed)
[place] MEDICAID FL2 LEVEL OF CARE SCREENING TOOL     IDENTIFICATION  Patient Name: Kayla Harrison Birthdate: 01/25/1946 Sex: female Admission Date (Current Location): 11/12/2021  [facility] and [place] Number:  Herbalist and Address:  [facility]. [facility], [place] [address] [place], [place] [address]      Provider Number: [id number]  Attending Physician Name and Address:  Georgette Shell, MD  Relative Name and Phone Number:       Current Level of Care: Hospital Recommended Level of Care: [place] Prior Approval Number:    Date Approved/Denied:   PASRR Number: [id number]  Discharge Plan: SNF    Current Diagnoses: Patient Active Problem List   Diagnosis Date Noted   History of fall 11/13/2021   History of hypotension 11/13/2021   Pressure ulcer with suspected deep tissue injury, stage II ([place]) 11/13/2021   Prolonged QT interval 11/13/2021   Traumatic hematoma of buttock 11/12/2021   Emphysematous cystitis 11/12/2021   Abnormal finding on imaging of liver 11/12/2021   Hypertension    Pressure injury of skin 03/15/2021   Intracranial hemorrhage ([place]) 03/14/2021   Lower extremity edema    Polymicrobial bacterial infection    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    Enterococcus faecalis infection    Sepsis due to Escherichia coli ([place])    Debility 02/13/2021   Infection of wound hematoma 02/05/2021   Physical debility 12/22/2020   Tear of left hamstring 12/22/2020   Labral tear of left hip joint 12/22/2020   Acute blood loss anemia    Orthostatic hypotension 12/17/2020   History of CVA (cerebrovascular accident) 12/17/2020   Fall 12/15/2020   Hip hematoma, left, initial encounter 12/15/2020   Nondisplaced fracture of coronoid process of left ulna, initial encounter for closed fracture 12/15/2020   Multiple rib fractures 12/15/2020   COVID-19 virus infection 10/12/2020   Constipation 06/22/2020   Change in  bowel habits 06/22/2020   Diverticulosis 06/22/2020   Dark stools 06/22/2020   Hemorrhoids 06/22/2020   Pacemaker 01/23/2020   Junctional rhythm 01/23/2020   Palpitations 01/23/2020   ICH (intracerebral hemorrhage) ([place]) 12/02/2019   A-fib ([place]) 12/02/2019   Anemia 12/02/2019   QT prolongation 12/02/2019   Hypothyroidism 12/02/2019   Gait abnormality 07/10/2018   S/P reverse total shoulder arthroplasty, 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   Persistent atrial fibrillation ([place])    Bilateral ureteral calculi 11/17/2017   Atrial fibrillation with RVR ([place]) 09/15/2017   Atrial flutter ([place]) 07/17/2017   Port catheter in place 04/02/2017   Non-Hodgkin lymphoma, unspecified, intrathoracic lymph nodes ([place]) 02/13/2017   Mediastinal mass 02/06/2017   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   Abnormal chest x-ray    Lung mass 02/03/2017   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   OSA (obstructive sleep apnea) 02/03/2015   History of renal calculi 11/16/2014   Renal calculi 11/16/2014   Family history of colon cancer 10/26/2014   Mechanical complication due to cardiac pacemaker pulse generator 11/06/2013   Dyspnea 05/12/2013   Hypothyroidism 04/21/2013   Pleural effusion 04/19/2013   Long term (current) use of anticoagulants 12/20/2010   EPIDERMOID CYST 08/22/2010   Chronic diastolic heart failure ([place]) 08/06/2010   SYNCOPE AND COLLAPSE 07/27/2010   OSTEOARTHRITIS, KNEE, RIGHT 03/15/2010   Class 1 obesity due to excess calories with body mass index (BMI) of 33.0 to 33.9 in adult 12/07/2009   NONSPEC ELEVATION OF LEVELS OF TRANSAMINASE/LDH 09/08/2009   BREAST CANCER, HX OF 07/25/2009  COLONIC POLYPS, HX OF 07/25/2009   TUBULOVILLOUS ADENOMA, COLON 04/29/2008   HYPERGLYCEMIA, FASTING 04/29/2008   HYPERLIPIDEMIA 06/22/2007   CARCINOMA, THYROID GLAND, HX OF 06/22/2007    Orientation RESPIRATION BLADDER Height & Weight     Self, Time, Situation, Place  Normal Continent Weight: 182 lb 15.7 oz  (83 kg) Height:  5\' 6"  (167.6 cm)  BEHAVIORAL SYMPTOMS/MOOD NEUROLOGICAL BOWEL NUTRITION STATUS      Continent Diet  AMBULATORY STATUS COMMUNICATION OF NEEDS Skin   Limited Assist Verbally Normal                       Personal Care Assistance Level of Assistance  Bathing, Feeding, Dressing Bathing Assistance: Limited assistance Feeding assistance: Limited assistance Dressing Assistance: Limited assistance     Functional Limitations Info             SPECIAL CARE FACTORS FREQUENCY  PT (By licensed PT), OT (By licensed OT)     PT Frequency: 5x a week OT Frequency: 5x a week            Contractures Contractures Info: Not present    Additional Factors Info  Code Status, Allergies Code Status Info: Full Allergies Info: Ranolazine   Rivaroxaban   Tape   Tikosyn (Dofetilide)   Epinephrine   Ketamine           Current Medications (11/15/2021):  This is the current hospital active medication list Current Facility-Administered Medications  Medication Dose Route Frequency Provider Last Rate Last Admin   0.9 %  sodium chloride infusion   Intravenous Continuous Allie Bossier, MD 50 mL/hr at 11/15/21 0534 Infusion Verify at 11/15/21 0534   acetaminophen (TYLENOL) tablet 650 mg  650 mg Oral Q6H PRN Shela Leff, MD   650 mg at 11/13/21 5945   Or   acetaminophen (TYLENOL) suppository 650 mg  650 mg Rectal Q6H PRN Shela Leff, MD       amiodarone (PACERONE) tablet 200 mg  200 mg Oral BID Shela Leff, MD   200 mg at 11/15/21 1006   feeding supplement (ENSURE ENLIVE / ENSURE PLUS) liquid 237 mL  237 mL Oral TID BM Allie Bossier, MD   237 mL at 11/15/21 1006   levothyroxine (SYNTHROID) tablet 125 mcg  125 mcg Oral Q0600 Georgette Shell, MD   125 mcg at 11/15/21 0524   midodrine (PROAMATINE) tablet 5 mg  5 mg Oral TID WC Allie Bossier, MD   5 mg at 11/15/21 1214   nitrofurantoin (macrocrystal-monohydrate) (MACROBID) capsule 100 mg  100 mg Oral Q12H  Georgette Shell, MD   100 mg at 11/15/21 1306   oxyCODONE-acetaminophen (PERCOCET/ROXICET) 5-325 MG per tablet 1 tablet  1 tablet Oral Q6H PRN Shela Leff, MD   1 tablet at 11/15/21 8592     Discharge Medications: Please see discharge summary for a list of discharge medications.  Relevant Imaging Results:  Relevant Lab Results:   Additional Information SSN: [id number] COVID vaccinated with booster  Emeterio Reeve, LCSW

## 2021-11-16 DIAGNOSIS — I951 Orthostatic hypotension: Secondary | ICD-10-CM | POA: Diagnosis not present

## 2021-11-16 LAB — COMPREHENSIVE METABOLIC PANEL
ALT: 44 U/L (ref 0–44)
AST: 45 U/L — ABNORMAL HIGH (ref 15–41)
Albumin: 2.3 g/dL — ABNORMAL LOW (ref 3.5–5.0)
Alkaline Phosphatase: 65 U/L (ref 38–126)
Anion gap: 5 (ref 5–15)
BUN: 18 mg/dL (ref 8–23)
CO2: 25 mmol/L (ref 22–32)
Calcium: 7.9 mg/dL — ABNORMAL LOW (ref 8.9–10.3)
Chloride: 108 mmol/L (ref 98–111)
Creatinine, Ser: 0.75 mg/dL (ref 0.44–1.00)
GFR, Estimated: >60 mL/min (ref >60)
Glucose, Bld: 100 mg/dL — ABNORMAL HIGH (ref 70–99)
Potassium: 4.1 mmol/L (ref 3.5–5.1)
Sodium: 138 mmol/L (ref 135–145)
Total Bilirubin: 1 mg/dL (ref 0.3–1.2)
Total Protein: 4.4 g/dL — ABNORMAL LOW (ref 6.5–8.1)

## 2021-11-16 LAB — CBC WITH DIFFERENTIAL/PLATELET
Abs Immature Granulocytes: 0.05 10*3/uL (ref 0.00–0.07)
Basophils Absolute: 0 10*3/uL (ref 0.0–0.1)
Basophils Relative: 1 %
Eosinophils Absolute: 0.3 10*3/uL (ref 0.0–0.5)
Eosinophils Relative: 5 %
HCT: 28.9 % — ABNORMAL LOW (ref 36.0–46.0)
Hemoglobin: 9.1 g/dL — ABNORMAL LOW (ref 12.0–15.0)
Immature Granulocytes: 1 %
Lymphocytes Relative: 9 %
Lymphs Abs: 0.6 10*3/uL — ABNORMAL LOW (ref 0.7–4.0)
MCH: 28.1 pg (ref 26.0–34.0)
MCHC: 31.5 g/dL (ref 30.0–36.0)
MCV: 89.2 fL (ref 80.0–100.0)
Monocytes Absolute: 1 10*3/uL (ref 0.1–1.0)
Monocytes Relative: 14 %
Neutro Abs: 5.2 10*3/uL (ref 1.7–7.7)
Neutrophils Relative %: 70 %
Platelets: 156 10*3/uL (ref 150–400)
RBC: 3.24 MIL/uL — ABNORMAL LOW (ref 3.87–5.11)
RDW: 17.9 % — ABNORMAL HIGH (ref 11.5–15.5)
WBC: 7.3 10*3/uL (ref 4.0–10.5)
nRBC: 0 % (ref 0.0–0.2)

## 2021-11-16 LAB — MAGNESIUM: Magnesium: 1.7 mg/dL (ref 1.7–2.4)

## 2021-11-16 LAB — IRON AND TIBC
Iron: 51 ug/dL (ref 28–170)
Saturation Ratios: 21 % (ref 10.4–31.8)
TIBC: 242 ug/dL — ABNORMAL LOW (ref 250–450)
UIBC: 191 ug/dL

## 2021-11-16 LAB — TRANSFERRIN: Transferrin: 173 mg/dL — ABNORMAL LOW (ref 192–382)

## 2021-11-16 LAB — PHOSPHORUS: Phosphorus: 2.6 mg/dL (ref 2.5–4.6)

## 2021-11-16 LAB — FOLATE: Folate: 10.7 ng/mL (ref >5.9)

## 2021-11-16 LAB — FERRITIN: Ferritin: 86 ng/mL (ref 11–307)

## 2021-11-16 LAB — VITAMIN B12: Vitamin B-12: 380 pg/mL (ref 180–914)

## 2021-11-16 LAB — RETICULOCYTES
Immature Retic Fract: 32.7 % — ABNORMAL HIGH (ref 2.3–15.9)
RBC.: 3.31 MIL/uL — ABNORMAL LOW (ref 3.87–5.11)
Retic Count, Absolute: 74.1 10*3/uL (ref 19.0–186.0)
Retic Ct Pct: 2.2 % (ref 0.4–3.1)

## 2021-11-16 MED ORDER — POLYETHYLENE GLYCOL 3350 17 G PO PACK
17.0000 g | PACK | Freq: Every day | ORAL | Status: DC
  Administered 2021-11-16 – 2021-11-17 (×2): 17 g via ORAL
  Filled 2021-11-16 (×2): qty 1

## 2021-11-16 MED ORDER — BISACODYL 10 MG RE SUPP
10.0000 mg | Freq: Once | RECTAL | Status: AC
  Administered 2021-11-16: 10 mg via RECTAL
  Filled 2021-11-16: qty 1

## 2021-11-16 NOTE — Progress Notes (Addendum)
PROGRESS NOTE    Kayla Harrison  WJX:[id number] DOB: 20-Dec-1945 DOA: 11/12/2021 PCP: [name], MD  Brief Narrative: 76 year old female lives at home alone with multiple falls.  She has history of atrial fibrillation heart block status post permanent pacemaker, chronic hypotension on midodrine Chest x-ray not suggestive of pneumonia, rib fractures, or pneumothorax.     Pelvic x-ray negative for acute finding.     CT head and C-spine negative for acute finding; showing small right-sided effusion in the upper right chest at the apex measures water density but is incompletely evaluated.  Previously noted to have a right-sided effusion in June 2022 that did not track to the level of the apex.   CT renal stone study showing marked volume of air within the bladder, including throughout the bladder walls, compatible with a severe emphysematous cystitis.  Showing a large right gluteal hematoma measuring approximately 15 x 9 x 11 cm.  Showing a small to moderate size right pleural effusion, incompletely imaged.  Hyperdense liver suspicious for hemochromatosis.   CT with additional views done: "No recent fracture or dislocation is seen in the right hip. There are large hematomas in the right gluteal muscles and in the subcutaneous plane in the right gluteal region extending to the posterior upper right thigh. There is 9.9 x 7 cm hematoma in the right gluteus maximus. There is 10.2 x 5.6 cm subcutaneous hematoma posterior to the gluteal muscles. In addition, there is extensive subcutaneous edema posterior to the gluteal and subcutaneous hematomas suggesting contusion extending from the right gluteal region and along the posterior right upper thigh."  Assessment & Plan:   Principal Problem:   Orthostatic hypotension Active Problems:   HYPERLIPIDEMIA   Pleural effusion   Hypothyroidism   Persistent atrial fibrillation ([place])   Fall   Traumatic hematoma of buttock   Emphysematous  cystitis   Abnormal finding on imaging of liver   History of fall   History of hypotension   Pressure ulcer with suspected deep tissue injury, stage II (HCC)   Prolonged QT interval   #1 orthostatic hypotension with syncope-her blood pressure was 58/32 with EMS.  Patient has had multiple falls at home prior to admission.  Pacemaker interrogated and shows no signs of arrhythmia. Blood pressure improved to 139/84, will DC IV fluids. She received albumin x2. Cortisol 16.6   Echocardiogram -ejection fraction 55 to 60%.  No regional wall motion abnormalities.  No LVH. TSH 0.049 doses adjusted.  #2 chronic persistent atrial fibrillation with RVR -received metoprolol 5 mg IV x1 with improvement in the rate. on amiodarone 200 twice daily Eliquis on hold due to large right gluteal hematoma Cardiology notes reviewed.  Noted iron studies and orthostatics ordered.  #3 anemia due to traumatic hematoma to the right buttocks status post 2 units of packed RBC, hemoglobin 9.1.  #4 emphysematous cystitis with patient complaining of dysuria -urine culture with ESBL E. coli.  Started Macrobid 11/15/2021  #5 hypothyroidism on Synthroid TSH is 0.049 Synthroid dose adjusted.  #6 hyperlipidemia holding statin due to elevated LFTs  #7 hypomagnesemia replete and recheck levels in a.m. magnesium 1.8.  #8?  Hemochromatosis of the liver CT with findings suspicious for hemochromatosis of the liver.  Will need outpatient follow-up.  #9 multiple falls lives alone PT recommends SNF.  She is requesting to go to CIR.  CIR consult placed.  #10 right buttock bruises/hematoma status post fall at home  #11 erythema to the right f heel keep legs elevated and  floating  #12 elevated LFTs improving continue to hold statin.    #13 constipation start MiraLAX  Nutrition Problem: Inadequate oral intake Etiology: poor appetite   Signs/Symptoms: per patient/family report    Interventions: Refer to RD note for  recommendations  Estimated body mass index is 29.93 kg/m as calculated from the following:   Height as of this encounter: 5\' 6"  (1.676 m).   Weight as of this encounter: 84.1 kg.  DVT prophylaxis: None due to large hematoma of the right buttocks and multiple falls Code Status: Full code  family Communication: None at bedside  disposition Plan:  Status is: Inpatient Remains inpatient appropriate because: IV fluids PT eval pending multiple falls large buttock hematoma   Consultants:  None  Procedures: None Antimicrobials: Rocephin Subjective:   Patient is sitting up in potty chair and is trying to have a bowel movement but is not able to.  Complains of constipation. Vitals:   11/15/21 2003 11/16/21 0500 11/16/21 0548 11/16/21 0752  BP: 129/73  113/67 139/84  Pulse: (!) 102  (!) 101 (!) 104  Resp: 17  17 17   Temp: 98.4 F (36.9 C)  98.7 F (37.1 C) 98.2 F (36.8 C)  TempSrc: Oral  Oral Oral  SpO2: 94%  95% 97%  Weight:  84.1 kg    Height:        Intake/Output Summary (Last 24 hours) at 11/16/2021 1101 Last data filed at 11/16/2021 0533 Gross per 24 hour  Intake 360 ml  Output --  Net 360 ml    Filed Weights   11/13/21 1700 11/15/21 0407 11/16/21 0500  Weight: 78.6 kg 83 kg 84.1 kg    Examination:  General exam: Appears frail elderly female Respiratory system: Clear to auscultation. Respiratory effort normal. Cardiovascular system: S1 & S2 heard, RRR. No JVD, murmurs, rubs, gallops or clicks. No pedal edema. Gastrointestinal system: Abdomen is nondistended, soft and nontender. No organomegaly or masses felt. Normal bowel sounds heard. Central nervous system: Alert and oriented. No focal neurological deficits. Extremities: Trace bilateral pitting edema erythema to the right heel noted skin: Right buttock bruises  psychiatry: Judgement and insight appear normal. Mood & affect appropriate.     Data Reviewed: I have personally reviewed following labs and imaging  studies  CBC: Recent Labs  Lab 11/12/21 1420 11/12/21 1432 11/13/21 1050 11/13/21 2011 11/14/21 0607 11/15/21 0510 11/16/21 0127  WBC 6.4  --   --   --  9.4 6.9 7.3  NEUTROABS 4.8  --   --   --  7.0 5.0 5.2  HGB 10.6*   < > 8.9* 11.1* 10.7* 9.8* 9.1*  HCT 34.5*   < > 26.6* 34.8* 31.7* 31.1* 28.9*  MCV 90.3  --   --   --  85.4 87.6 89.2  PLT 164  --   --   --  144* 144* 156   < > = values in this interval not displayed.    Basic Metabolic Panel: Recent Labs  Lab 11/12/21 1420 11/12/21 1432 11/14/21 0607 11/15/21 0510 11/16/21 0127  NA 142 140 140 139 138  K 4.0 3.9 4.0 4.1 4.1  CL 107 107 106 108 108  CO2 27  --  24 25 25   GLUCOSE 116* 111* 103* 102* 100*  BUN 22 24* 22 18 18   CREATININE 0.97 1.00 1.00 0.81 0.75  CALCIUM 8.8*  --  8.1* 7.9* 7.9*  MG  --   --  1.6* 1.8 1.7  PHOS  --   --  2.6 2.5 2.6    GFR: Estimated Creatinine Clearance: 64.3 mL/min (by C-G formula based on SCr of 0.75 mg/dL). Liver Function Tests: Recent Labs  Lab 11/13/21 1050 11/14/21 0607 11/15/21 0510 11/16/21 0127  AST  --  53* 47* 45*  ALT  --  58* 49* 44  ALKPHOS  --  65 68 65  BILITOT  --  0.8 0.6 1.0  PROT  --  4.6* 4.3* 4.4*  ALBUMIN 2.4* 2.3* 2.0* 2.3*    No results for input(s): LIPASE, AMYLASE in the last 168 hours. No results for input(s): AMMONIA in the last 168 hours. Coagulation Profile: Recent Labs  Lab 11/12/21 1420 11/13/21 2011  INR 1.6* 1.3*    Cardiac Enzymes: No results for input(s): CKTOTAL, CKMB, CKMBINDEX, TROPONINI in the last 168 hours. BNP (last 3 results) No results for input(s): PROBNP in the last 8760 hours. HbA1C: No results for input(s): HGBA1C in the last 72 hours. CBG: No results for input(s): GLUCAP in the last 168 hours. Lipid Profile: No results for input(s): CHOL, HDL, LDLCALC, TRIG, CHOLHDL, LDLDIRECT in the last 72 hours. Thyroid Function Tests: Recent Labs    11/14/21 0607  TSH 0.049*    Anemia Panel: No results for  input(s): VITAMINB12, FOLATE, FERRITIN, TIBC, IRON, RETICCTPCT in the last 72 hours. Sepsis Labs: No results for input(s): PROCALCITON, LATICACIDVEN in the last 168 hours.  Recent Results (from the past 240 hour(s))  Resp Panel by RT-PCR (Flu A&B, Covid) Nasopharyngeal Swab     Status: None   Collection Time: 11/12/21  2:06 PM   Specimen: Nasopharyngeal Swab; Nasopharyngeal(NP) swabs in vial transport medium  Result Value Ref Range Status   SARS Coronavirus 2 by RT PCR NEGATIVE NEGATIVE Final    Comment: (NOTE) SARS-CoV-2 target nucleic acids are NOT DETECTED.  The SARS-CoV-2 RNA is generally detectable in upper respiratory specimens during the acute phase of infection. The lowest concentration of SARS-CoV-2 viral copies this assay can detect is 138 copies/mL. A negative result does not preclude SARS-Cov-2 infection and should not be used as the sole basis for treatment or other patient management decisions. A negative result may occur with  improper specimen collection/handling, submission of specimen other than nasopharyngeal swab, presence of viral mutation(s) within the areas targeted by this assay, and inadequate number of viral copies(<138 copies/mL). A negative result must be combined with clinical observations, patient history, and epidemiological information. The expected result is Negative.  Fact Sheet for Patients:  EntrepreneurPulse.com.au  Fact Sheet for Healthcare Providers:  IncredibleEmployment.be  This test is no t yet approved or cleared by the [place] FDA and  has been authorized for detection and/or diagnosis of SARS-CoV-2 by FDA under an Emergency Use Authorization (EUA). This EUA will remain  in effect (meaning this test can be used) for the duration of the COVID-19 declaration under Section 564(b)(1) of the Act, 21 U.S.C.section 360bbb-3(b)(1), unless the authorization is terminated  or revoked sooner.        Influenza A by PCR NEGATIVE NEGATIVE Final   Influenza B by PCR NEGATIVE NEGATIVE Final    Comment: (NOTE) The Xpert Xpress SARS-CoV-2/FLU/RSV plus assay is intended as an aid in the diagnosis of influenza from Nasopharyngeal swab specimens and should not be used as a sole basis for treatment. Nasal washings and aspirates are unacceptable for Xpert Xpress SARS-CoV-2/FLU/RSV testing.  Fact Sheet for Patients: EntrepreneurPulse.com.au  Fact Sheet for Healthcare Providers: IncredibleEmployment.be  This test is not yet approved or cleared by the  [place] Architectural technologist and has been authorized for detection and/or diagnosis of SARS-CoV-2 by FDA under an Print production planner (EUA). This EUA will remain in effect (meaning this test can be used) for the duration of the COVID-19 declaration under Section 564(b)(1) of the Act, 21 U.S.C. section 360bbb-3(b)(1), unless the authorization is terminated or revoked.  Performed at [facility], [place] [address], [place], [place] [address]   Urine Culture     Status: Abnormal   Collection Time: 11/12/21  8:28 PM   Specimen: Urine, Clean Catch  Result Value Ref Range Status   Specimen Description URINE, CLEAN CATCH  Final   Special Requests   Final    NONE Performed at [facility], [place] [address] [place], [place] [address]    Culture (A)  Final    >=100,000 COLONIES/mL ESCHERICHIA COLI Confirmed Extended Spectrum Beta-Lactamase Producer (ESBL).  In bloodstream infections from ESBL organisms, carbapenems are preferred over piperacillin/tazobactam. They are shown to have a lower risk of mortality.    Report Status 11/15/2021 FINAL  Final   Organism ID, Bacteria ESCHERICHIA COLI (A)  Final      Susceptibility   Escherichia coli - MIC*    AMPICILLIN >=32 RESISTANT Resistant     CEFAZOLIN >=64 RESISTANT Resistant     CEFEPIME 16 RESISTANT Resistant     CEFTRIAXONE >=64 RESISTANT Resistant      CIPROFLOXACIN >=4 RESISTANT Resistant     GENTAMICIN <=1 SENSITIVE Sensitive     IMIPENEM <=0.25 SENSITIVE Sensitive     NITROFURANTOIN <=16 SENSITIVE Sensitive     TRIMETH/SULFA <=20 SENSITIVE Sensitive     AMPICILLIN/SULBACTAM 8 SENSITIVE Sensitive     PIP/TAZO <=4 SENSITIVE Sensitive     * >=100,000 COLONIES/mL ESCHERICHIA COLI          Radiology Studies: No results found.      Scheduled Meds:  amiodarone  200 mg Oral BID   feeding supplement  237 mL Oral TID BM   levothyroxine  125 mcg Oral Q0600   midodrine  5 mg Oral TID WC   nitrofurantoin (macrocrystal-monohydrate)  100 mg Oral Q12H   polyethylene glycol  17 g Oral Daily   Continuous Infusions:  sodium chloride 50 mL/hr at 11/15/21 1638     LOS: 4 days    Time spent: 44 minutes    Georgette Shell, MD2/24/2023, 11:01 AM

## 2021-11-16 NOTE — Progress Notes (Signed)
Mobility Specialist: Progress Note   11/16/21 1646  Mobility  Bed Position Chair  Activity Ambulated with assistance in room  Level of Assistance Moderate assist, patient does 50-74%  Assistive Device Front wheel walker  Distance Ambulated (ft) 38 ft (10'+28')  Activity Response Tolerated well  $Mobility charge 1 Mobility   Pt received in bed and agreeable to ambulation. Required minA to sit EOB and modA to stand. Ambulated to the couch before taking seated break d/t R hip pain she rated 6.5/10. Pt then ambulated to the door and back to recliner. To BSC before recliner, BM unsuccessful. Chair alarm is on.   [facility] Kayla Harrison Mobility Specialist Mobility Specialist 5 North: [phone] Mobility Specialist 6 North: [phone]

## 2021-11-16 NOTE — Progress Notes (Addendum)
Inpatient Rehab Admissions:  Inpatient Rehab Consult received.  I met with patient at the bedside for rehabilitation assessment and to discuss goals and expectations of an inpatient rehab admission.  Pt acknowledged  understanding of CIR goals and expectations. Pt interested in pursuing CIR. Pt unsure of support after discharge. Pt gave permission to contact sister, [name]. [facility] and left a message; awaiting return call. Will continue to follow.   ADDENDUM: Spoke with pt's sister [name]. She does not think pt will have 24/7 support after discharge. She thinks pt may have intermittent support. Will discuss pt's dispo with physiatrist.    Signed: Gayland Curry, [place], [place] Admissions Coordinator [phone]

## 2021-11-16 NOTE — Consult Note (Addendum)
ELECTROPHYSIOLOGY CONSULT NOTE    Patient ID: Kayla Harrison MRN: [id number], DOB/AGE: 78-18-1947 1 y.o.  Admit date: 11/12/2021 Date of Consult: 11/16/2021  Primary Physician: [name], MD Primary Cardiologist: [name], MD  Electrophysiologist: Dr. Caryl Comes  Referring Provider: Dr. Zigmund Daniel  Patient Profile: Kayla Harrison is a 76 y.o. female with a history of history of thyroid cancer, hypothyroidism, NHL/SVC syndrome /lung cancer with radiation therapy as well as chemotherapy 2018 followed by Dr. Lindi Adie, pacemaker placement for atrial fibrillation maintained on Eliquis followed by Dr. Caryl Comes, obesity status post lap band surgery, hypertension, hyperlipidemia, obesity with BMI 33.64, recurrent falls who is being seen today for the evaluation of orthostatic hypotension at the request of Dr. Zigmund Daniel.  HPI:  Kayla Harrison is a 76 y.o. female with medical history as above, very well known to EP team, and particularly Dr. Caryl Comes.   Please see EP office note from 04/30/2021 for summary of several long admissions from 2022.  Most recently seen by Dr. Caryl Comes 07/2021, who was overall getting stronger outside of significant fatigue.   Pt presented via EMS 11/12/2021 with fall and c/o HA and right hip/buttock pain after multiple falls. Upon EMS arrival, patient was found to be hypotensive with blood pressure 58/32 when sitting up and SBP 90 when laying down.  She was given a 350 cc fluid bolus after which blood pressure improved to 130/62.  In the ED, patient was hypotensive again with systolic as low as 32K.  Blood pressure improved after a 500 cc fluid bolus.  Not febrile or tachycardic.  Not hypoxic.  Labs showing no leukocytosis.  Hemoglobin 10.6, was 11.9 on 10/04/2021.  COVID and influenza PCR negative.  UA with negative nitrite, small amount of leukocytes and microscopy showing >50 RBCs, 11-20 WBCs, and many bacteria.  Urine culture  CXR - not suggestive of pneumonia, rib  fractures, or pneumothorax.   Pelvic x-ray - negative for acute finding.   CT head and C-spine negative for acute finding; showing small right-sided effusion in the upper right chest at the apex measures water density but is incompletely evaluated.  Previously noted to have a right-sided effusion in June 2022 that did not track to the level of the apex. CT renal stone study showing marked volume of air within the bladder, including throughout the bladder walls, compatible with a severe emphysematous cystitis.  Showing a large right gluteal hematoma measuring approximately 15 x 9 x 11 cm.  Showing a small to moderate size right pleural effusion, incompletely imaged.  Hyperdense liver suspicious for hemochromatosis. CT with additional views done: "No recent fracture or dislocation is seen in the right hip. There are large hematomas in the right gluteal muscles and in the subcutaneous plane in the right gluteal region extending to the posterior upper right thigh. There is 9.9 x 7 cm hematoma in the right gluteus maximus. There is 10.2 x 5.6 cm subcutaneous hematoma posterior to the gluteal muscles. In addition, there is extensive subcutaneous edema posterior to the gluteal and subcutaneous hematomas suggesting contusion extending from the right gluteal region and along the posterior right upper thigh."  Eliquis on hold with large gluteal hematoma  Pt feeling "much better this am".   Again states she was USOH prior to her fall(s) that day.  She felt she had slipped on laundry, but concerning from EMS report that orthostasis was likely contributory. Asks if she really needs to go back on Eliquis.   Past Medical History:  Diagnosis  Date   Anemia    Arthritis    osteoarthritis - knees and right shoulder   Blood transfusion without reported diagnosis    Breast cancer ([place])    Dr Margot Chimes, total thyroidectomy- 1999- for cancer   Brucellosis 1964   Chronic bilateral pleural effusions    Colon polyp    Dr  Earlean Shawl   Complication of anesthesia    Ketamine produces LSD reaction, bright colored nightmarish experience    Dyslipidemia    Endometriosis    Fibroids    H/O pleural effusion    s/p thoracentesis w 3258m withdrawn   Hematoma 11/12/2021   Hepatitis    [place], ?hepatitis    History of dysphagia    due to radiation therapy   History of hiatal hernia    small noted on PET scan   Hypertension    Hypothyroidism    Lung cancer, lower lobe ([place] 01[place]   radiation RX completed 03/04/17; will start chemo 6/27, pt unaware of lung cancer   Morbid obesity ([place]    Status post lap band surgery   Nephrolithiasis    Non Hodgkin's lymphoma ([place]    on chemotherapy   Persistent atrial fibrillation ([place]    a. s/p PVI 2008 b. s/p convergent ablation 23244complicated by bradycardia requiring pacemaker implant   Personal history of radiation therapy    Presence of permanent cardiac pacemaker    Rotator cuff tear    Right   [place]   SVC syndrome    with lung mass and non hodgkins lymphoma   [place] cancer [place]     Surgical History:  Past Surgical History:  Procedure Laterality Date   ABDOMINAL HYSTERECTOMY  1983   afib ablation     a. 2008 PVI b. 2014 convergent ablation   APPENDECTOMY     BONE MARROW BIOPSY  02/21/2017   BREAST LUMPECTOMY Left 2010   bKerrick    2015- negative   CARDIOVERSION  10/09/2012   Procedure: CARDIOVERSION;  Surgeon: JMinus Breeding MD;  [place] [facility] [place]  Service: Cardiovascular;  Laterality: N/A;   CARDIOVERSION  10/09/2012   Procedure: CARDIOVERSION;  Surgeon: JMinus Breeding MD;  [facility]  Service: Cardiovascular;  Laterality: N/A;  [name] the ok to add pt to the add on , but we must check to find out if the can add pt on at 1400 ( [phone])   CARDIOVERSION N/A 11/20/2012   Procedure: CARDIOVERSION;  Surgeon: PFay Records MD;  [place]  Service: Cardiovascular;  Laterality: N/A;   CARDIOVERSION N/A 07/18/2017   Procedure: CARDIOVERSION;  Surgeon: NThayer Headings MD;  [place]  Service: Cardiovascular;  Laterality: N/A;   CARDIOVERSION N/A 10/03/2017   Procedure: CARDIOVERSION;  Surgeon: CSanda  MD;  [facility];  Service: Cardiovascular;  Laterality: N/A;   CARDIOVERSION N/A 01/07/2018   Procedure: CARDIOVERSION;  Surgeon: Nahser, PWonda Cheng MD;  [facility]  Service: Cardiovascular;  Laterality: N/A;   CARDIOVERSION N/A 12/10/2019   Procedure: CARDIOVERSION;  Surgeon: CBuford Dresser MD;  [facility]  Service: Cardiovascular;  Laterality: N/A;   CARDIOVERSION N/A 03/09/2021   Procedure: CARDIOVERSION;  Surgeon: HPixie Casino MD;  [facility]  Service: Cardiovascular;  Laterality: N/A;   CARDIOVERSION N/A 10/04/2021   Procedure: CARDIOVERSION;  Surgeon: SJerline Pain MD;  [facility];  Service: Cardiovascular;  Laterality: N/A;   CHOLECYSTECTOMY     COLONOSCOPY W/ POLYPECTOMY     Dr Earlean Shawl   CYSTOSCOPY N/A 02/06/2015   Procedure: CYSTOSCOPY;  Surgeon: Kathie Rhodes, MD;  [facility];  Service: Urology;  Laterality: N/A;   CYSTOSCOPY W/ RETROGRADES Left 11/17/2017   Procedure: CYSTOSCOPY WITH RETROGRADE /PYELOGRAM/;  Surgeon: Kathie Rhodes, MD;  [facility];  Service: Urology;  Laterality: Left;   CYSTOSCOPY WITH RETROGRADE PYELOGRAM, URETEROSCOPY AND STENT PLACEMENT Right 02/06/2015   Procedure: RETROGRADE PYELOGRAM, RIGHT URETEROSCOPY STENT PLACEMENT;  Surgeon: Kathie Rhodes, MD;  [facility];  Service: Urology;  Laterality: Right;   CYSTOSCOPY WITH RETROGRADE PYELOGRAM, URETEROSCOPY AND STENT PLACEMENT Right 03/07/2017   Procedure: CYSTOSCOPY WITH RIGHT RETROGRADE PYELOGRAM,RIGHT  URETEROSCOPYLASER LITHOTRIPSY  AND STENT PLACEMENT AND STONE BASKETRY;  Surgeon: Kathie Rhodes, MD;  Location[place];  Service: Urology;  Laterality: Right;   EYE SURGERY     cataract surgery   fatty mass removal  1999   pubic area   HOLMIUM LASER APPLICATION N/A 0/93/8182   Procedure: HOLMIUM LASER APPLICATION;  Surgeon: Kathie Rhodes, MD;  [facility];  Service: Urology;  Laterality: N/A;   HOLMIUM LASER APPLICATION Right 9/93/7169   Procedure: HOLMIUM LASER APPLICATION;  Surgeon: Kathie Rhodes, MD;  [facility];  Service: Urology;  Laterality: Right;   HOLMIUM LASER APPLICATION Left 6/78/9381   Procedure: HOLMIUM LASER APPLICATION;  Surgeon: Kathie Rhodes, MD;  [facility];  Service: Urology;  Laterality: Left;   I & D EXTREMITY Left 12/19/2020   Procedure: IRRIGATION AND DEBRIDEMENT OF LEFT HIP HEMATOMA WITH APPLICATION OF WOUND VAC;  Surgeon: Erle Crocker, MD;  Location: [place];  Service: Orthopedics;  Laterality: Left;   I & D EXTREMITY Left 02/06/2021   Procedure: IRRIGATION AND DEBRIDEMENT DEEP ABCESS LEFT THIGH, SECONDARY CLOSURE OF WOUND DEHISCENCE;  Surgeon: Erle Crocker, MD;  Location: [place];  Service: Orthopedics;  Laterality: Left;   IR FLUORO GUIDE PORT INSERTION RIGHT  02/24/2017   IR NEPHROSTOMY PLACEMENT RIGHT  11/17/2017   IR PATIENT EVAL TECH 0-60 MINS  03/11/2017   IR REMOVAL TUN ACCESS W/ PORT W/O FL MOD SED  04/20/2018   IR US GUIDE VASC ACCESS RIGHT  02/24/2017   KNEE ARTHROSCOPY     bilateral   LAPAROSCOPIC GASTRIC BANDING  07/10/2010   LAPAROSCOPIC GASTRIC BANDING     Laparoscopic adjustable banding APS System with posterior hiatal hernia, 2 suture.   LAPAROTOMY     for ruptured ovary and ovarian artery    NEPHROLITHOTOMY Right 11/17/2017   Procedure: NEPHROLITHOTOMY PERCUTANEOUS;  Surgeon: Kathie Rhodes, MD;  [facility];  Service: Urology;  Laterality: Right;   PACEMAKER INSERTION  03/10/2013   MDT dual chamber PPM   POCKET REVISION N/A 12/08/2013   Procedure: POCKET REVISION;  Surgeon: Deboraha Sprang, MD;  Location: [facility]  CATH LAB;  Service: Cardiovascular;  Laterality: N/A;   PORTA CATH INSERTION     REVERSE SHOULDER ARTHROPLASTY Right 05/14/2018   Procedure: RIGHT REVERSE SHOULDER ARTHROPLASTY;  Surgeon: Tania Ade, MD;  Location: [place];  Service: Orthopedics;  Laterality: Right;   REVERSE SHOULDER REPLACEMENT Right 05/14/2018   RIGHT HEART CATH N/A 07/21/2019   Procedure: RIGHT HEART CATH;  Surgeon: Larey Dresser, MD;  Location: [place] CV [facility];  Service: Cardiovascular;  Laterality: N/A;   TEE WITH CARDIOVERSION  09/22/2017   TEE WITHOUT CARDIOVERSION N/A  10/03/2017   Procedure: TRANSESOPHAGEAL ECHOCARDIOGRAM (TEE);  Surgeon: Sanda , MD;  [facility];  Service: Cardiovascular;  Laterality: N/A;   TEE WITHOUT CARDIOVERSION N/A 08/04/2019   Procedure: TRANSESOPHAGEAL ECHOCARDIOGRAM (TEE);  Surgeon: Larey Dresser, MD;  Location[facility];  Service: Cardiovascular;  Laterality: N/A;   TEE WITHOUT CARDIOVERSION N/A 12/10/2019   Procedure: TRANSESOPHAGEAL ECHOCARDIOGRAM (TEE);  Surgeon: Buford Dresser, MD;  [facility];  Service: Cardiovascular;  Laterality: N/A;   TEE WITHOUT CARDIOVERSION N/A 03/09/2021   Procedure: TRANSESOPHAGEAL ECHOCARDIOGRAM (TEE);  Surgeon: Pixie Casino, MD;  Location: [facility];  Service: Cardiovascular;  Laterality: N/A;   TEE WITHOUT CARDIOVERSION N/A 10/04/2021   Procedure: TRANSESOPHAGEAL ECHOCARDIOGRAM (TEE);  Surgeon: Jerline Pain, MD;  Location[facility];  Service: Cardiovascular;  Laterality: N/A;   THYROIDECTOMY  1998   Dr Margot Chimes   TONSILLECTOMY     TOTAL KNEE ARTHROPLASTY  04/13/2012   Procedure: TOTAL KNEE ARTHROPLASTY;  Surgeon: Rudean Haskell, MD;  Location: [place];  Service: Orthopedics;  Laterality: Right;   VIDEO BRONCHOSCOPY WITH ENDOBRONCHIAL ULTRASOUND N/A 02/07/2017   Procedure: VIDEO BRONCHOSCOPY WITH ENDOBRONCHIAL ULTRASOUND;  Surgeon: Marshell Garfinkel, MD;  Location: [place];  Service: Pulmonary;   Laterality: N/A;     Medications Prior to Admission  Medication Sig Dispense Refill Last Dose   acetaminophen (TYLENOL) 325 MG tablet Take 2 tablets (650 mg total) by mouth every 4 (four) hours as needed for mild pain (or temp > 37.5 C (99.5 F)).   unknown   amiodarone (PACERONE) 200 MG tablet Take 1 tablet (200 mg total) by mouth in the morning and at bedtime.   11/11/2021   apixaban (ELIQUIS) 5 MG TABS tablet TAKE 1 TABLET BY MOUTH  TWICE DAILY 180 tablet 1 11/11/2021 at 1900   Calcium Carb-Cholecalciferol (CALCIUM + VITAMIN D3 PO) Take 1 tablet by mouth daily.   11/11/2021   levothyroxine (SYNTHROID) 150 MCG tablet Take 150 mcg by mouth daily.   11/11/2021   RESTASIS 0.05 % ophthalmic emulsion Place 1 drop into both eyes daily.   11/11/2021   rosuvastatin (CRESTOR) 10 MG tablet Take 1 tablet (10 mg total) by mouth daily. Mondays & Thursdays (Patient taking differently: Take 10 mg by mouth See admin instructions. Mondays & Thursdays)   11/08/2021   midodrine (PROAMATINE) 5 MG tablet TAKE 1 TABLET BY MOUTH TWICE DAILY WITH A MEAL 60 tablet 0 unknown    Inpatient Medications:   amiodarone  200 mg Oral BID   feeding supplement  237 mL Oral TID BM   levothyroxine  125 mcg Oral Q0600   midodrine  5 mg Oral TID WC   nitrofurantoin (macrocrystal-monohydrate)  100 mg Oral Q12H    Allergies:  Allergies  Allergen Reactions   Ranolazine     Balance issues   Rivaroxaban     Nose Bleed X 6 hrs ; packing in ER Nasal hemorrhage   Tape Rash and Other (See Comments)    SKIN IS THIN AND TEARS EASILY (also does not tolerate paper tape well)     Tikosyn [Dofetilide] Other (See Comments)    "Long QT wave"    Epinephrine Other (See Comments)    Oral anesthetic Dental form only (liquid). Patient stated she will become "out of it" she can hear you but cannot respond in normal fashion. "not fully with it"   Ketamine Other (See Comments)    Hallucinations     Social History   Socioeconomic History  Marital status: Single    Spouse name: Not on file   Number of children: 0   Years of education: Not on file   Highest education level: Master's degree (e.g., MA, MS, MEng, MEd, MSW, MBA)  Occupational History   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    Employer: RETIRED  Tobacco Use   Smoking status: Never   Smokeless tobacco: Never  Vaping Use   Vaping Use: Never used  Substance and Sexual Activity   Alcohol use: No    Comment: none since 1990   Drug use: Never   Sexual activity: Not Currently    Birth control/protection: Surgical  Other Topics Concern   Not on file  Social History Narrative   ** Merged History Encounter **       SINGLE  NO CHILDREN NEVER SMOKED EXERCISE 3 X WK RETIRED NO CAFFEINE      Social Determinants of Health   Financial Resource Strain: Not on file  Food Insecurity: Not on file  Transportation Needs: Not on file  Physical Activity: Not on file  Stress: Not on file  Social Connections: Not on file  Intimate Partner Violence: Not on file     Family History  Problem Relation Age of Onset   Heart disease Father 61       MI _0    Colon cancer Father        COLON   Heart attack Father    Other Mother        temporal arteritis    Diabetes Sister    Diabetes Brother    Diabetes Paternal Aunt    Diabetes Paternal Grandmother    Esophageal cancer Neg Hx    Inflammatory bowel disease Neg Hx    Liver disease Neg Hx    Pancreatic cancer Neg Hx    Stomach cancer Neg Hx      Review of Systems: All other systems reviewed and are otherwise negative except as noted above.  Physical Exam: Vitals:   11/15/21 2003 11/16/21 0500 11/16/21 0548 11/16/21 0752  BP: 129/73  113/67 139/84  Pulse: (!) 102  (!) 101 (!) 104  Resp: _1 Temp: 98.4 F (36.9 C)  98.7 F (37.1 C) 98.2 F (36.8 C)  TempSrc: Oral  Oral Oral  SpO2: 94%  95% 97%  Weight:  84.1 kg    Height:        GEN- The patient is well appearing,  alert and oriented x 3 today.   HEENT: normocephalic, atraumatic; sclera clear, conjunctiva pink; hearing intact; oropharynx clear; neck supple Lungs- Clear to ausculation bilaterally, normal work of breathing.  No wheezes, rales, rhonchi Heart- Regular rate and rhythm, no murmurs, rubs or gallops GI- soft, non-tender, non-distended, bowel sounds present Extremities- no clubbing, cyanosis, or edema; DP/PT/radial pulses 2+ bilaterally MS- no significant deformity or atrophy Skin- warm and dry, no rash or lesion Psych- euthymic mood, full affect Neuro- strength and sensation are intact  Labs:   Lab Results  Component Value Date   WBC 7.3 11/16/2021   HGB 9.1 (L) 11/16/2021   HCT 28.9 (L) 11/16/2021   MCV 89.2 11/16/2021   PLT 156 11/16/2021    Recent Labs  Lab 11/16/21 0127  NA 138  K 4.1  CL 108  CO2 25  BUN 18  CREATININE 0.75  CALCIUM 7.9*  PROT 4.4*  BILITOT 1.0  ALKPHOS 65  ALT 44  AST 45*  GLUCOSE 100*  Radiology/Studies: CT Head Wo Contrast  Result Date: 11/12/2021 CLINICAL DATA:  Head trauma, moderate-severe EXAM: CT HEAD WITHOUT CONTRAST TECHNIQUE: Contiguous axial images were obtained from the base of the skull through the vertex without intravenous contrast. RADIATION DOSE REDUCTION: This exam was performed according to the departmental dose-optimization program which includes automated exposure control, adjustment of the mA and/or kV according to patient size and/or use of iterative reconstruction technique. COMPARISON:  03/15/2021 FINDINGS: Brain: No evidence of acute infarction, hemorrhage, hydrocephalus, extra-axial collection or mass lesion/mass effect. Small focus of encephalomalacia within the left caudate head at site of previously seen hemorrhage. Moderate-advanced low-density changes within the periventricular and subcortical white matter compatible with chronic microvascular ischemic change. Mild diffuse cerebral volume loss. Vascular: Atherosclerotic  calcifications involving the large vessels of the skull base. No unexpected hyperdense vessel. Skull: Normal. Negative for fracture or focal lesion. Sinuses/Orbits: No acute finding. Other: None. IMPRESSION: 1. No acute intracranial abnormality. 2. Chronic microvascular ischemic disease. Electronically Signed   By: Davina Poke D.O.   On: 11/12/2021 16:25   CT CHEST WO CONTRAST  Result Date: 11/14/2021 CLINICAL DATA:  76 year old female with history of pleural effusion. EXAM: CT CHEST WITHOUT CONTRAST TECHNIQUE: Multidetector CT imaging of the chest was performed following the standard protocol without IV contrast. RADIATION DOSE REDUCTION: This exam was performed according to the departmental dose-optimization program which includes automated exposure control, adjustment of the mA and/or kV according to patient size and/or use of iterative reconstruction technique. COMPARISON:  Chest CT 03/14/2021. FINDINGS: Cardiovascular: Heart size is normal. There is no significant pericardial fluid, thickening or pericardial calcification. There is aortic atherosclerosis, as well as atherosclerosis of the great vessels of the mediastinum and the coronary arteries, including calcified atherosclerotic plaque in the left main, left anterior descending, left circumflex and right coronary arteries. Severe calcifications of the mitral annulus. Left-sided pacemaker/AICD with lead tips terminating in the right atrium and right ventricular apex. Mediastinum/Nodes: No pathologically enlarged mediastinal or hilar lymph nodes. Esophagus is unremarkable in appearance. No axillary lymphadenopathy. Lungs/Pleura: Moderate right pleural effusion increased slightly compared to the prior study. Trace left pleural effusion. Dependent opacities in the lungs bilaterally, indicative of areas of passive subsegmental atelectasis. There also some patchy areas of ground-glass attenuation, septal thickening, thickening of the peribronchovascular  interstitium, mild cylindrical bronchiectasis and regional architectural distortion most evident in the medial aspect of the right lung and medial aspect of the left lower lobe, similar to prior studies, presumably areas of chronic post infectious or inflammatory scarring. No acute consolidative airspace disease. No definite suspicious appearing pulmonary nodules or masses are noted. Upper Abdomen: LapBand in position. Calcified granulomas in the spleen. Low-attenuation lesion in segment 4A of the liver measuring 1.2 cm in diameter, incompletely characterized on today's non-contrast CT examination, but statistically likely to represent a cyst. High attenuation throughout the visualized hepatic parenchyma, concerning for hepatic iron deposition. Nonobstructive calculi in the left renal collecting system measuring up to 6 mm in the interpolar region. Musculoskeletal: Status post right shoulder arthroplasty. There are no aggressive appearing lytic or blastic lesions noted in the visualized portions of the skeleton. IMPRESSION: 1. Moderate right pleural effusion increased slightly compared to the prior study. 2. Trace left pleural effusion lying dependently. 3. Areas of post infectious or inflammatory scarring in the lungs bilaterally, similar to the prior study. Additional areas of passive subsegmental atelectasis are noted dependently in the lower lobes of the lungs. 4. Aortic atherosclerosis, in addition to left main and three-vessel  coronary artery disease. Assessment for potential risk factor modification, dietary therapy or pharmacologic therapy may be warranted, if clinically indicated. 5. There are calcifications of the mitral annulus. Echocardiographic correlation for evaluation of potential valvular dysfunction may be warranted if clinically indicated. 6. Numerous nonobstructive calculi in the collecting system of the left kidney measuring up to 6 mm in the interpolar region. Aortic Atherosclerosis  (ICD10-I70.0). Electronically Signed   By: Vinnie Langton M.D.   On: 11/14/2021 08:15   CT Cervical Spine Wo Contrast  Result Date: 11/12/2021 CLINICAL DATA:  Neck trauma in a 76 year old female. EXAM: CT CERVICAL SPINE WITHOUT CONTRAST TECHNIQUE: Multidetector CT imaging of the cervical spine was performed without intravenous contrast. Multiplanar CT image reconstructions were also generated. RADIATION DOSE REDUCTION: This exam was performed according to the departmental dose-optimization program which includes automated exposure control, adjustment of the mA and/or kV according to patient size and/or use of iterative reconstruction technique. COMPARISON:  December 15, 2020. FINDINGS: Alignment: Normal. Skull base and vertebrae: No acute fracture. No primary bone lesion or focal pathologic process. Soft tissues and spinal canal: No prevertebral fluid or swelling. No visible canal hematoma. Disc levels: Multilevel degenerative changes with small to moderate anterior osteophytes greatest at C5-6. Moderate to marked uncovertebral spurring with some neural foraminal narrowing at C5-6 and C6-7 greatest on the RIGHT. Upper chest: Small RIGHT-sided effusion in the upper RIGHT chest measures water density but is incompletely evaluated. Incidental imaging of upper chest is otherwise unremarkable. Other: None IMPRESSION: 1. No acute fracture or static subluxation of the cervical spine. 2. Multilevel degenerative changes with some neural foraminal narrowing at C5-6 and C6-7 greatest on the RIGHT. 3. Small RIGHT-sided effusion in the upper RIGHT chest at the apex measures water density but is incompletely evaluated. Previously noted to have a RIGHT-sided effusion in June of 2022 that did not track to the level of the apex. Electronically Signed   By: Zetta Bills M.D.   On: 11/12/2021 16:35   DG Pelvis Portable  Result Date: 11/12/2021 CLINICAL DATA:  Level 2 trauma, fall, pain EXAM: PORTABLE PELVIS 1-2 VIEWS  COMPARISON:  12/15/2020 FINDINGS: Bones are osteopenic. Similar degenerative changes of the pelvis and both hips. No malalignment or acute osseous finding. Bony pelvis and hips appear symmetric and intact. Gastric banding hardware noted. Aortoiliac and femoral atherosclerosis present. IMPRESSION: No acute osseous finding. Electronically Signed   By: Jerilynn Mages.  Shick M.D.   On: 11/12/2021 14:26   DG Chest Portable 1 View  Result Date: 11/12/2021 CLINICAL DATA:  Level 2 trauma, fall EXAM: PORTABLE CHEST 1 VIEW COMPARISON:  12/15/2020 FINDINGS: Stable postoperative changes including left subclavian 2 lead pacer, left atrial appendage clip, and right shoulder reverse arthroplasty. Stable heart size and vascularity. Minor right base atelectasis or scarring. No focal pneumonia, collapse or consolidation. No large effusion or pneumothorax. Trachea midline. Aorta atherosclerotic. IMPRESSION: Stable chronic postoperative findings. Right basilar atelectasis versus scarring. Aortic Atherosclerosis (ICD10-I70.0). Electronically Signed   By: Jerilynn Mages.  Shick M.D.   On: 11/12/2021 14:24   ECHOCARDIOGRAM COMPLETE  Result Date: 11/13/2021    ECHOCARDIOGRAM REPORT   Patient Name:   Kayla Harrison Date of Exam: 11/13/2021 Medical Rec #:  [id number]           Height:       66.0 in Accession #:    [id number]          Weight:       200.0 lb Date of Birth:  1946/05/27  BSA:          2.000 m Patient Age:    32 years            BP:           81/59 mmHg Patient Gender: F                   HR:           128 bpm. Exam Location:  Inpatient Procedure: 2D Echo, Cardiac Doppler and Color Doppler Indications:    Syncope  History:        Patient has prior history of Echocardiogram examinations, most                 recent 10/04/2021. Stroke, Arrythmias:Atrial Fibrillation; Risk                 Factors:Dyslipidemia and Hypertension. 03/09/2021 cardioversion                 07/21/2019 cath.  Sonographer:    Luisa Hart RDCS Referring Phys:  [id number] [place]  1. Left ventricular ejection fraction, by estimation, is 55 to 60%. The left ventricle has normal function. The left ventricle has no regional wall motion abnormalities. Indeterminate diastolic filling due to E-A fusion. There is the interventricular septum is flattened in diastole ('D' shaped left ventricle), consistent with right ventricular volume overload.  2. Right ventricular systolic function is mildly reduced. The right ventricular size is mildly enlarged. There is mildly elevated pulmonary artery systolic pressure.  3. Left atrial size was moderately dilated.  4. The mitral valve is degenerative. Moderate mitral valve regurgitation. Mild mitral stenosis. The mean mitral valve gradient is 5.0 mmHg with average heart rate of 122 bpm. Severe mitral annular calcification.  5. Tricuspid valve regurgitation is severe.  6. The aortic valve is normal in structure. There is mild calcification of the aortic valve. There is mild thickening of the aortic valve. Aortic valve regurgitation is not visualized. Aortic valve sclerosis is present, with no evidence of aortic valve stenosis.  7. The inferior vena cava is dilated in size with >50% respiratory variability, suggesting right atrial pressure of 8 mmHg. Comparison(s): Prior images reviewed side by side. Mitral insufficiency appears to be less severe. FINDINGS  Left Ventricle: Left ventricular ejection fraction, by estimation, is 55 to 60%. The left ventricle has normal function. The left ventricle has no regional wall motion abnormalities. The left ventricular internal cavity size was normal in size. There is  no left ventricular hypertrophy. The interventricular septum is flattened in diastole ('D' shaped left ventricle), consistent with right ventricular volume overload. Indeterminate diastolic filling due to E-A fusion. Right Ventricle: The right ventricular size is mildly enlarged. No increase in right ventricular wall  thickness. Right ventricular systolic function is mildly reduced. There is mildly elevated pulmonary artery systolic pressure. The tricuspid regurgitant  velocity is 2.92 m/s, and with an assumed right atrial pressure of 8 mmHg, the estimated right ventricular systolic pressure is 15.1 mmHg. Left Atrium: Left atrial size was moderately dilated. Right Atrium: Right atrial size was normal in size. Pericardium: There is no evidence of pericardial effusion. Mitral Valve: The mitral valve is degenerative in appearance. Severe mitral annular calcification. Moderate mitral valve regurgitation, with centrally-directed jet. Mild mitral valve stenosis. MV peak gradient, 8.9 mmHg. The mean mitral valve gradient is  5.0 mmHg with average heart rate of 122 bpm. Tricuspid Valve: The tricuspid valve is grossly normal. Tricuspid valve regurgitation  is severe. Aortic Valve: The aortic valve is normal in structure. There is mild calcification of the aortic valve. There is mild thickening of the aortic valve. Aortic valve regurgitation is not visualized. Aortic valve sclerosis is present, with no evidence of aortic valve stenosis. Aortic valve mean gradient measures 5.2 mmHg. Aortic valve peak gradient measures 10.2 mmHg. Aortic valve area, by VTI measures 1.64 cm. Pulmonic Valve: The pulmonic valve was grossly normal. Pulmonic valve regurgitation is not visualized. Aorta: The aortic root is normal in size and structure. Venous: The inferior vena cava is dilated in size with greater than 50% respiratory variability, suggesting right atrial pressure of 8 mmHg. The inferior vena cava and the hepatic vein show a pattern of systolic flow reversal, suggestive of tricuspid regurgitation. IAS/Shunts: There is redundancy of the interatrial septum. No atrial level shunt detected by color flow Doppler. Additional Comments: A device lead is visualized in the right ventricle.  LEFT VENTRICLE PLAX 2D LVIDd:         4.60 cm LVIDs:         3.20 cm  LV PW:         1.00 cm LV IVS:        0.90 cm LVOT diam:     1.70 cm LV SV:         30 LV SV Index:   15 LVOT Area:     2.27 cm  RIGHT VENTRICLE RV Basal diam:  3.50 cm RV Mid diam:    1.60 cm LEFT ATRIUM              Index        RIGHT ATRIUM           Index LA diam:        4.30 cm  2.15 cm/m   RA Area:     14.10 cm LA Vol (A2C):   68.2 ml  34.10 ml/m  RA Volume:   29.50 ml  14.75 ml/m LA Vol (A4C):   107.0 ml 53.51 ml/m LA Biplane Vol: 86.5 ml  43.25 ml/m  AORTIC VALVE                     PULMONIC VALVE AV Area (Vmax):    1.34 cm      PV Vmax:       0.83 m/s AV Area (Vmean):   1.32 cm      PV Vmean:      60.233 cm/s AV Area (VTI):     1.64 cm      PV VTI:        0.116 m AV Vmax:           159.75 cm/s   PV Peak grad:  2.8 mmHg AV Vmean:          104.500 cm/s  PV Mean grad:  1.7 mmHg AV VTI:            0.185 m AV Peak Grad:      10.2 mmHg AV Mean Grad:      5.2 mmHg LVOT Vmax:         94.50 cm/s LVOT Vmean:        60.900 cm/s LVOT VTI:          0.134 m LVOT/AV VTI ratio: 0.72  AORTA Ao Root diam: 2.40 cm Ao Asc diam:  2.90 cm MITRAL VALVE                TRICUSPID  VALVE MV Area VTI:  1.33 cm      TR Peak grad:   34.1 mmHg MV Peak grad: 8.9 mmHg      TR Vmax:        292.00 cm/s MV Mean grad: 5.0 mmHg MV Vmax:      1.49 m/s      SHUNTS MV Vmean:     107.0 cm/s    Systemic VTI:  0.13 m MV VTI:       0.23 m        Systemic Diam: 1.70 cm MR Peak grad: 76.4 mmHg MR Mean grad: 53.5 mmHg MR Vmax:      437.00 cm/s MR Vmean:     346.5 cm/s MV E velocity: 139.00 cm/s Sanda  MD Electronically signed by Sanda  MD Signature Date/Time: 11/13/2021/11:57:00 AM    Final    CT Renal Stone Study  Result Date: 11/12/2021 CLINICAL DATA:  Nephrolithiasis. EXAM: CT ABDOMEN AND PELVIS WITHOUT CONTRAST TECHNIQUE: Multidetector CT imaging of the abdomen and pelvis was performed following the standard protocol without IV contrast. RADIATION DOSE REDUCTION: This exam was performed according to the departmental  dose-optimization program which includes automated exposure control, adjustment of the mA and/or kV according to patient size and/or use of iterative reconstruction technique. COMPARISON:  CT abdomen dated 07/18/2020. FINDINGS: Lower chest: RIGHT pleural effusion, small to moderate in size, incompletely imaged. Hepatobiliary: Liver is hyperdense. No focal mass or lesion is seen within the liver. Status post cholecystectomy. No significant bile duct dilatation appreciated. Pancreas: Unremarkable. No pancreatic ductal dilatation or surrounding inflammatory changes. Spleen: Unremarkable. Adrenals/Urinary Tract: Adrenal glands are unremarkable. Multiple LEFT renal stones, largest measuring 7 mm. No hydronephrosis. Several punctate RIGHT renal stones. No hydronephrosis. No ureteral or bladder calculi are identified. Marked amount of air within the bladder, including throughout the bladder walls, compatible with emphysematous cystitis. Stomach/Bowel: No dilated large or small bowel loops. No evidence of bowel wall inflammation. Lap band in place. Stomach otherwise unremarkable. Scattered diverticulosis of the descending and sigmoid colon but no focal inflammatory change to suggest acute diverticulitis. Vascular/Lymphatic: Aortic atherosclerosis. No abdominal aortic aneurysm. No enlarged lymph nodes seen in the abdomen or pelvis. Reproductive: Presumed hysterectomy. No adnexal mass or free fluid seen. Other: No free fluid or abscess collection is seen in the abdomen or pelvis. No free intraperitoneal air seen. Musculoskeletal: Degenerative spondylosis of the thoracolumbar spine, mild to moderate in degree. No acute-appearing osseous abnormality. Heterogeneously dense mass-like structure involving the RIGHT gluteus musculature, extending into the overlying subcutaneous soft tissues, measuring approximately 15 x 9 x 11 cm, presumably a large hematoma, less likely abscess or neoplastic mass. IMPRESSION: 1. Marked amount of  air within the bladder, including throughout the bladder walls, compatible with a severe emphysematous cystitis. 2. Very large mass-like structure involving the RIGHT gluteus musculature, extending into the overlying subcutaneous soft tissues, measuring approximately 15 x 9 x 11 cm, presumably a large hematoma, less likely abscess or neoplastic mass. Ordering physician states that patient has had a recent fall, therefore, this is consistent with a large hematoma. 3. Bilateral nephrolithiasis. No hydronephrosis. No ureteral or bladder calculi are identified. 4. Colonic diverticulosis without evidence of acute diverticulitis. 5. RIGHT pleural effusion, small to moderate in size, incompletely imaged. 6. Liver is hyperdense suggesting hemochromatosis. These results were called by telephone at the time of interpretation on 11/12/2021 at 8:27 pm to provider [facility] , who verbally acknowledged these results. Electronically Signed   By: Franki Cabot  M.D.   On: 11/12/2021 20:28   CT NO CHARGE  Result Date: 11/12/2021 CLINICAL DATA:  Trauma, fall, pain EXAM: CT ADDITIONAL VIEWS AT NO CHARGE TECHNIQUE: Axial, coronal and sagittal images of right hip were processed from the CT renal stone study. CONTRAST:  None COMPARISON:  None. FINDINGS: No recent fracture is seen in the right hip. There is no dislocation. There is large area of subcutaneous contusion/hematoma in the right gluteal region. There is 9.9 x 7 cm hematoma in the right gluteal muscles. There is another 10.2 x 5.6 cm hematoma in the subcutaneous plane. There is no significant effusion in the right hip joint. Extensive arterial calcifications are seen. There is possible peritoneal dialysis catheter in the lower abdomen. There is extensive pneumatosis in the bladder wall. There is air in the lumen of urinary bladder. Large amount of stool is seen in the rectosigmoid. IMPRESSION: No recent fracture or dislocation is seen in the right hip. There are large  hematomas in the right gluteal muscles and in the subcutaneous plane in the right gluteal region extending to the posterior upper right thigh. There is 9.9 x 7 cm hematoma in the right gluteus maximus. There is 10.2 x 5.6 cm subcutaneous hematoma posterior to the gluteal muscles. In addition, there is extensive subcutaneous edema posterior to the gluteal and subcutaneous hematomas suggesting contusion extending from the right gluteal region and along the posterior right upper thigh. There is extensive pneumatosis in the urinary bladder wall. There is air in the lumen of urinary bladder. These findings may suggest recent intervention or infectious process such as emphysematous cystitis. Electronically Signed   By: Elmer Picker M.D.   On: 11/12/2021 20:26    EKG: on 11/13/2021 shows AF 110s (personally reviewed) EKG on arrival demonstrates NSR Personal review of device interrogation via carelink demonstrates she has been in NSR since [facility] in January up to admission.   TELEMETRY: AF 90-100s (personally reviewed)  DEVICE HISTORY:  Medtronic Dual Chamber PPM implanted 2014 for SSS  Assessment/Plan:  Orthostatic hypotension with syncope Orthostatics ordered.  Initial complaint with BP 58/32 with EMS, improved to 110-120s after lying her down (though 10 minutes apart) Cortisol 16.6 Has received albumin  Echo 11/13/21 LVEF 55 to 60%.  No WMA  TSH 0.049 On midodrine 5 mg TID with significant improvement in her BP   Chronic persistent atrial fibrillation with RVR  Continue amiodarone 200 twice daily Eliquis on hold due to large right gluteal hematoma She had been in NSR since her [facility] in January. Will need to consider getting her back into rhythm once she is safe to go back on eliquis.    Anemia due to traumatic hematoma to the right buttocks  S/p 2uPRBCs Hgb 9.1 this am.    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 coli.  Macrobid started 11/15/2021    Hypothyroidism on Synthroid  TSH is 0.049. Dose decreased by primary.    HLD Statin on hold with elevated LFTs    Hypomagnesemia  On going despite supp.  Mg 1.7 this am.    Hyperdense liver Literature on this may be consistent with chronic amiodarone use.  Iron labs added to work up further for hemachromatosis which was another potential concern.    Frequent Falls Continues to live alone PT recommending SNF    Dr. Caryl Comes has seen. .   For questions or updates, please contact [place] Please consult [email or web address] for contact info under Cardiology/STEMI.  Gunnar Fusi  Joesph July, PA-C  11/16/2021 8:21 AM   Fall-question mechanical.  Noted to have hypotension upon arrival of EMS no orthostatic symptoms  Hematoma/anemia requiring transfusion  Atrial fibrillation-persistent sinus rhythm on arrival now back in A-fib; anticoagulation on hold  Amiodarone therapy   Pleural effusion  Lymphoma-quiescient  Pacemaker-Medtronic   History of orthostasis, but not clearly evident at the current time.  Blood pressures are reasonable and have been since arrival to hospital.  Still, I think ProAmatine is a reasonable intercurrent therapy.  Also very important is for her to be on horizontal as much of the day she can be; hence, I put her bed in reverse Trendelenburg and would recommend out of bed to chair as much of the day as possible.  She also has complaints of right leg weakness this worse than previously and attributed in her mind to her prior stroke.  There is some history of foot drag, it is very mechanical solutions to this i.e. a foot left.  We also discussed various strategies at home to prevent her from bending which has been associated with a risk of falling.  We will plan to resume Eliquis at some point when it is deemed safe and then anticipate cardioversion again about 3 to 4 weeks after that in the hopes of maintaining sinus rhythm which has been helpful for  her symptoms.  We will continue amiodarone for rate control

## 2021-11-16 NOTE — TOC Initial Note (Signed)
Transition of Care [facility]) - Initial/Assessment Note    Patient Details  Name: Kayla Harrison MRN: [id number] Date of Birth: 03-10-1946  Transition [facility]) CM/SW Contact:    Emeterio Reeve, LCSW Phone Number: 11/16/2021, 8:46 AM  Clinical Narrative:                  CSW received SNF consult. CSW met with pt at bedside. CSW introduced self and explained role at the hospital. Pt reports that PTA she was living at home alone. Pt reports she was independent with mobility and ADL's.   CSW reviewed PT/OT recommendations for SNF. Pt reports she would prefer not to go to SNF but would if she had to. Pt reports she has been to [facility] in the past and would prefer to return. CSW informed team of request. Pt gave CSW permission to fax out to facilities in the area as a back up to CIR. Pt has no preference of facility at this time. CSW gave pt medicare.gov rating list to review. CSW explained insurance auth process. Pt reports they are covid vaccinated.  CSW will continue to follow.    Expected Discharge Plan: Skilled Nursing Facility Barriers to Discharge: Continued Medical Work up   Patient Goals and CMS Choice   CMS Medicare.gov Compare Post Acute Care list provided to:: Patient Choice offered to / list presented to : Patient  Expected Discharge Plan and Services Expected Discharge Plan: [place] arrangements for the past 2 months: Single Family Home                                      Prior Living Arrangements/Services Living arrangements for the past 2 months: Single Family Home Lives with:: Self Patient language and need for interpreter reviewed:: Yes Do you feel safe going back to the place where you live?: Yes      Need for Family Participation in Patient Care: Yes (Comment) Care giver support system in place?: Yes (comment)   Criminal Activity/Legal Involvement Pertinent to Current Situation/Hospitalization: No - Comment as  needed  Activities of Daily Living Home Assistive Devices/Equipment: Walker (specify type), Cane (specify quad or straight) ADL Screening (condition at time of admission) Patient's cognitive ability adequate to safely complete daily activities?: Yes Is the patient deaf or have difficulty hearing?: No Does the patient have difficulty seeing, even when wearing glasses/contacts?: Yes Does the patient have difficulty concentrating, remembering, or making decisions?: No Patient able to express need for assistance with ADLs?: Yes Does the patient have difficulty dressing or bathing?: Yes Independently performs ADLs?: Yes (appropriate for developmental age) Does the patient have difficulty walking or climbing stairs?: Yes Weakness of Legs: Both Weakness of Arms/Hands: Both  Permission Sought/Granted Permission sought to share information with : Chartered certified accountant granted to share information with : Yes, Verbal Permission Granted     Permission granted to share info w AGENCY: SNF        Emotional Assessment Appearance:: Appears stated age Attitude/Demeanor/Rapport: Engaged Affect (typically observed): Appropriate Orientation: : Oriented to Self, Oriented to Place, Oriented to  Time, Oriented to Situation Alcohol / Substance Use: Not Applicable Psych Involvement: No (comment)  Admission diagnosis:  Orthostatic hypotension [I95.1] Trauma [T14.90XA] Hematoma [T14.8XXA] Fall [W19.XXXA] Near syncope [R55] Cystitis [N30.90] Minor head injury, initial encounter [B58.30NM] Fall, initial encounter [W19.XXXA] Patient Active Problem List  Diagnosis Date Noted   History of fall 11/13/2021   History of hypotension 11/13/2021   Pressure ulcer with suspected deep tissue injury, stage II ([place]) 11/13/2021   Prolonged QT interval 11/13/2021   Traumatic hematoma of buttock 11/12/2021   Emphysematous cystitis 11/12/2021   Abnormal finding on imaging of liver 11/12/2021    Hypertension    Pressure injury of skin 03/15/2021   Intracranial hemorrhage ([place]) 03/14/2021   Lower extremity edema    Polymicrobial bacterial infection    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    Enterococcus faecalis infection    Sepsis due to Escherichia coli ([place])    Debility 02/13/2021   Infection of wound hematoma 02/05/2021   Physical debility 12/22/2020   Tear of left hamstring 12/22/2020   Labral tear of left hip joint 12/22/2020   Acute blood loss anemia    Orthostatic hypotension 12/17/2020   History of CVA (cerebrovascular accident) 12/17/2020   Fall 12/15/2020   Hip hematoma, left, initial encounter 12/15/2020   Nondisplaced fracture of coronoid process of left ulna, initial encounter for closed fracture 12/15/2020   Multiple rib fractures 12/15/2020   COVID-19 virus infection 10/12/2020   Constipation 06/22/2020   Change in bowel habits 06/22/2020   Diverticulosis 06/22/2020   Dark stools 06/22/2020   Hemorrhoids 06/22/2020   Pacemaker 01/23/2020   Junctional rhythm 01/23/2020   Palpitations 01/23/2020   ICH (intracerebral hemorrhage) ([place]) 12/02/2019   A-fib ([place]) 12/02/2019   Anemia 12/02/2019   QT prolongation 12/02/2019   Hypothyroidism 12/02/2019   Gait abnormality 07/10/2018   S/P reverse total shoulder arthroplasty, 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   Persistent atrial fibrillation ([place])    Bilateral ureteral calculi 11/17/2017   Atrial fibrillation with RVR ([place]) 09/15/2017   Atrial flutter ([place]) 07/17/2017   Port catheter in place 04/02/2017   Non-Hodgkin lymphoma, unspecified, intrathoracic lymph nodes ([place]) 02/13/2017   Mediastinal mass 02/06/2017   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   Abnormal chest x-ray    Lung mass 02/03/2017   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   OSA (obstructive sleep apnea) 02/03/2015   History of renal calculi 11/16/2014   Renal calculi 11/16/2014   Family history of colon cancer 10/26/2014   Mechanical  complication due to cardiac pacemaker pulse generator 11/06/2013   Dyspnea 05/12/2013   Hypothyroidism 04/21/2013   Pleural effusion 04/19/2013   Long term (current) use of anticoagulants 12/20/2010   EPIDERMOID CYST 08/22/2010   Chronic diastolic heart failure ([place]) 08/06/2010   SYNCOPE AND COLLAPSE 07/27/2010   OSTEOARTHRITIS, KNEE, RIGHT 03/15/2010   Class 1 obesity due to excess calories with body mass index (BMI) of 33.0 to 33.9 in adult 12/07/2009   NONSPEC ELEVATION OF LEVELS OF TRANSAMINASE/LDH 09/08/2009   BREAST CANCER, HX OF 07/25/2009   COLONIC POLYPS, HX OF 07/25/2009   TUBULOVILLOUS ADENOMA, 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 [place] [address] [place] [address] Phone: [phone] Fax: [phone]  [facility] ([facility] ) - [place], [place] [address] Phone: [phone] Fax: [phone]  Social Determinants of Health (SDOH) Interventions    Readmission Risk Interventions No flowsheet data found.  Emeterio Reeve, LCSW Clinical Social Worker

## 2021-11-17 ENCOUNTER — Encounter (HOSPITAL_COMMUNITY): Payer: Self-pay | Admitting: Internal Medicine

## 2021-11-17 DIAGNOSIS — I951 Orthostatic hypotension: Secondary | ICD-10-CM | POA: Diagnosis not present

## 2021-11-17 LAB — CBC WITH DIFFERENTIAL/PLATELET
Abs Immature Granulocytes: 0.07 10*3/uL (ref 0.00–0.07)
Basophils Absolute: 0.1 10*3/uL (ref 0.0–0.1)
Basophils Relative: 1 %
Eosinophils Absolute: 0.3 10*3/uL (ref 0.0–0.5)
Eosinophils Relative: 4 %
HCT: 30.3 % — ABNORMAL LOW (ref 36.0–46.0)
Hemoglobin: 9.8 g/dL — ABNORMAL LOW (ref 12.0–15.0)
Immature Granulocytes: 1 %
Lymphocytes Relative: 9 %
Lymphs Abs: 0.7 10*3/uL (ref 0.7–4.0)
MCH: 28.7 pg (ref 26.0–34.0)
MCHC: 32.3 g/dL (ref 30.0–36.0)
MCV: 88.6 fL (ref 80.0–100.0)
Monocytes Absolute: 1.1 10*3/uL — ABNORMAL HIGH (ref 0.1–1.0)
Monocytes Relative: 13 %
Neutro Abs: 5.8 10*3/uL (ref 1.7–7.7)
Neutrophils Relative %: 72 %
Platelets: 184 10*3/uL (ref 150–400)
RBC: 3.42 MIL/uL — ABNORMAL LOW (ref 3.87–5.11)
RDW: 18.2 % — ABNORMAL HIGH (ref 11.5–15.5)
WBC: 8.1 10*3/uL (ref 4.0–10.5)
nRBC: 0 % (ref 0.0–0.2)

## 2021-11-17 LAB — COMPREHENSIVE METABOLIC PANEL
ALT: 51 U/L — ABNORMAL HIGH (ref 0–44)
AST: 61 U/L — ABNORMAL HIGH (ref 15–41)
Albumin: 2.3 g/dL — ABNORMAL LOW (ref 3.5–5.0)
Alkaline Phosphatase: 77 U/L (ref 38–126)
Anion gap: 5 (ref 5–15)
BUN: 14 mg/dL (ref 8–23)
CO2: 26 mmol/L (ref 22–32)
Calcium: 8 mg/dL — ABNORMAL LOW (ref 8.9–10.3)
Chloride: 108 mmol/L (ref 98–111)
Creatinine, Ser: 0.64 mg/dL (ref 0.44–1.00)
GFR, Estimated: >60 mL/min (ref >60)
Glucose, Bld: 104 mg/dL — ABNORMAL HIGH (ref 70–99)
Potassium: 4.5 mmol/L (ref 3.5–5.1)
Sodium: 139 mmol/L (ref 135–145)
Total Bilirubin: 1.7 mg/dL — ABNORMAL HIGH (ref 0.3–1.2)
Total Protein: 4.6 g/dL — ABNORMAL LOW (ref 6.5–8.1)

## 2021-11-17 LAB — MAGNESIUM: Magnesium: 1.6 mg/dL — ABNORMAL LOW (ref 1.7–2.4)

## 2021-11-17 LAB — PHOSPHORUS: Phosphorus: 2.2 mg/dL — ABNORMAL LOW (ref 2.5–4.6)

## 2021-11-17 MED ORDER — MAGNESIUM OXIDE -MG SUPPLEMENT 400 (240 MG) MG PO TABS
400.0000 mg | ORAL_TABLET | Freq: Every day | ORAL | Status: DC
  Administered 2021-11-17 – 2021-11-20 (×4): 400 mg via ORAL
  Filled 2021-11-17 (×4): qty 1

## 2021-11-17 MED ORDER — MAGNESIUM SULFATE 4 GM/100ML IV SOLN
4.0000 g | Freq: Once | INTRAVENOUS | Status: AC
  Administered 2021-11-17: 4 g via INTRAVENOUS
  Filled 2021-11-17: qty 100

## 2021-11-17 MED ORDER — PHENAZOPYRIDINE HCL 100 MG PO TABS
100.0000 mg | ORAL_TABLET | Freq: Three times a day (TID) | ORAL | Status: AC
  Administered 2021-11-17 (×3): 100 mg via ORAL
  Filled 2021-11-17 (×3): qty 1

## 2021-11-17 NOTE — Plan of Care (Signed)
°  Problem: Education: Goal: Knowledge of General Education information will improve Description: Including pain rating scale, medication(s)/side effects and non-pharmacologic comfort measures Outcome: Progressing   Problem: Health Behavior/Discharge Planning: Goal: Ability to manage health-related needs will improve Outcome: Progressing   Problem: Nutrition: Goal: Adequate nutrition will be maintained Outcome: Progressing   Problem: Coping: Goal: Level of anxiety will decrease Outcome: Progressing   Problem: Elimination: Goal: Will not experience complications related to bowel motility Outcome: Progressing Goal: Will not experience complications related to urinary retention Outcome: Progressing   Problem: Pain Managment: Goal: General experience of comfort will improve Outcome: Progressing   Problem: Safety: Goal: Ability to remain free from injury will improve Outcome: Progressing   Problem: Skin Integrity: Goal: Risk for impaired skin integrity will decrease Outcome: Progressing

## 2021-11-17 NOTE — Progress Notes (Signed)
Progress Note  Patient Name: Kayla Harrison Date of Encounter: 11/17/2021  [facility] Cardiologist: [name], MD   Subjective   No chest pain.  Mildly dyspneic this morning.  Complains of pain in buttocks from recent fall.  Inpatient Medications    Scheduled Meds:  amiodarone  200 mg Oral BID   feeding supplement  237 mL Oral TID BM   levothyroxine  125 mcg Oral Q0600   magnesium oxide  400 mg Oral Daily   midodrine  5 mg Oral TID WC   nitrofurantoin (macrocrystal-monohydrate)  100 mg Oral Q12H   phenazopyridine  100 mg Oral TID WC   polyethylene glycol  17 g Oral Daily   Continuous Infusions:  magnesium sulfate bolus IVPB 4 g (11/17/21 1211)   PRN Meds: acetaminophen **OR** acetaminophen, oxyCODONE-acetaminophen   Vital Signs    Vitals:   11/16/21 2036 11/16/21 2050 11/17/21 0500 11/17/21 0617  BP: 135/86 133/62  133/84  Pulse: (!) 105 75  (!) 101  Resp: 18 18  17   Temp: 98.4 F (36.9 C) 97.7 F (36.5 C)  98.3 F (36.8 C)  TempSrc: Oral Oral  Oral  SpO2: 93% 100%  96%  Weight:   84 kg   Height:        Intake/Output Summary (Last 24 hours) at 11/17/2021 1213 Last data filed at 11/17/2021 0617 Gross per 24 hour  Intake 2389.24 ml  Output 650 ml  Net 1739.24 ml   Last 3 Weights 11/17/2021 11/16/2021 11/15/2021  Weight (lbs) 185 lb 3 oz 185 lb 6.5 oz 182 lb 15.7 oz  Weight (kg) 84 kg 84.1 kg 83 kg      Telemetry    Atrial fibrillation with upper normal to mildly elevated rate- Personally Reviewed  Physical Exam   GEN: No acute distress.   Neck: No JVD Cardiac: Irregular and tachycardic Respiratory: Clear to auscultation bilaterally. GI: Soft, nontender, non-distended  MS: No edema Neuro:  Nonfocal  Psych: Normal affect   Labs    High Sensitivity Troponin:   Recent Labs  Lab 11/13/21 0241  TROPONINIHS 17     Chemistry Recent Labs  Lab 11/15/21 0510 11/16/21 0127 11/17/21 0302  NA 139 138 139  K 4.1 4.1 4.5  CL 108 108 108   CO2 25 25 26   GLUCOSE 102* 100* 104*  BUN 18 18 14   CREATININE 0.81 0.75 0.64  CALCIUM 7.9* 7.9* 8.0*  MG 1.8 1.7 1.6*  PROT 4.3* 4.4* 4.6*  ALBUMIN 2.0* 2.3* 2.3*  AST 47* 45* 61*  ALT 49* 44 51*  ALKPHOS 68 65 77  BILITOT 0.6 1.0 1.7*  GFRNONAA >60 >60 >60  ANIONGAP 6 5 5     Lipids No results for input(s): CHOL, TRIG, HDL, LABVLDL, LDLCALC, CHOLHDL in the last 168 hours.  Hematology Recent Labs  Lab 11/15/21 0510 11/16/21 0127 11/17/21 0302  WBC 6.9 7.3 8.1  RBC 3.55* 3.24*   3.31* 3.42*  HGB 9.8* 9.1* 9.8*  HCT 31.1* 28.9* 30.3*  MCV 87.6 89.2 88.6  MCH 27.6 28.1 28.7  MCHC 31.5 31.5 32.3  RDW 17.8* 17.9* 18.2*  PLT 144* 156 184   Thyroid  Recent Labs  Lab 11/14/21 0607  TSH 0.049*    BNP Recent Labs  Lab 11/13/21 0241  BNP 254.3*    DDimer  Recent Labs  Lab 11/13/21 0241  DDIMER 0.98*     Patient Profile     76 y.o. female with past medical history of  thyroid cancer, hypothyroidism, non-Hodgkin's lymphoma, history of pacemaker, paroxysmal atrial fibrillation, hypertension, hyperlipidemia admitted with orthostatic syncope.  Echocardiogram shows normal LV function, mild right ventricular enlargement, mild RV dysfunction, moderate left atrial enlargement, moderate mitral regurgitation, mild mitral stenosis, severe tricuspid regurgitation.  Assessment & Plan    1 orthostatic hypotension with syncope-some improvement with hydration.  Continue midodrine 5 mg 3 times daily.  2 persistent atrial fibrillation-patient noted to be in atrial fibrillation this admission.  We will continue amiodarone 200 mg daily.  Resume Eliquis when okay from right gluteal hematoma standpoint.  Cardiovert 4 weeks after reinitiating anticoagulation.  3 anemia-likely secondary to hematoma.  Hemoglobin today 9.8.  Continue to follow.  4 status post pacemaker  We will see again Monday if patient remains in the hospital.  Please call prior to then with questions.  For questions  or updates, please contact [place] Please consult [email or web address] for contact info under        Signed, Kirk Ruths, MD  11/17/2021, 12:13 PM

## 2021-11-17 NOTE — Progress Notes (Signed)
PROGRESS NOTE    Kayla Harrison  XKG:[id number] DOB: Sep 21, 1946 DOA: 11/12/2021 PCP: [name], MD  Brief Narrative: 76 year old female lives at home alone with multiple falls.  She has history of atrial fibrillation heart block status post permanent pacemaker, chronic hypotension on midodrine Chest x-ray not suggestive of pneumonia, rib fractures, or pneumothorax.     Pelvic x-ray negative for acute finding.     CT head and C-spine negative for acute finding; showing small right-sided effusion in the upper right chest at the apex measures water density but is incompletely evaluated.  Previously noted to have a right-sided effusion in June 2022 that did not track to the level of the apex.   CT renal stone study showing marked volume of air within the bladder, including throughout the bladder walls, compatible with a severe emphysematous cystitis.  Showing a large right gluteal hematoma measuring approximately 15 x 9 x 11 cm.  Showing a small to moderate size right pleural effusion, incompletely imaged.  Hyperdense liver suspicious for hemochromatosis.   CT with additional views done: "No recent fracture or dislocation is seen in the right hip. There are large hematomas in the right gluteal muscles and in the subcutaneous plane in the right gluteal region extending to the posterior upper right thigh. There is 9.9 x 7 cm hematoma in the right gluteus maximus. There is 10.2 x 5.6 cm subcutaneous hematoma posterior to the gluteal muscles. In addition, there is extensive subcutaneous edema posterior to the gluteal and subcutaneous hematomas suggesting contusion extending from the right gluteal region and along the posterior right upper thigh."  Assessment & Plan:   Principal Problem:   Orthostatic hypotension Active Problems:   HYPERLIPIDEMIA   Pleural effusion   Hypothyroidism   Persistent atrial fibrillation ([place])   Fall   Traumatic hematoma of buttock   Emphysematous  cystitis   Abnormal finding on imaging of liver   History of fall   History of hypotension   Pressure ulcer with suspected deep tissue injury, stage II (HCC)   Prolonged QT interval   #1 orthostatic hypotension with syncope-her blood pressure was 58/32 with EMS.  Patient has had multiple falls at home prior to admission.  Pacemaker interrogated and shows no signs of arrhythmia. Blood pressure improved to 139/84, will DC IV fluids. She received albumin x2. Cortisol 16.6   Echocardiogram -ejection fraction 55 to 60%.  No regional wall motion abnormalities.  No LVH. TSH 0.049 Synthroid dose adjusted. Continue midodrine 5 mg 3 times a day.  #2 chronic persistent atrial fibrillation with RVR -rate controlled on amiodarone.  Eliquis still on hold due to large right gluteal hematoma.    #3 anemia due to traumatic hematoma to the right buttocks status post 2 units of packed RBC, hemoglobin 9.1.  #4 emphysematous cystitis with patient complaining of dysuria -urine culture with ESBL E. coli.  Started Macrobid 11/15/2021 Patient complaining of bladder spasms started on Pyridium last night.  #5 hypothyroidism on Synthroid TSH is 0.049 Synthroid dose adjusted.  #6 hyperlipidemia holding statin due to elevated LFTs  #7 hypomagnesemia mag 1.6 replete.    #8?  Hemochromatosis of the liver CT with findings suspicious for hemochromatosis of the liver.  Will need outpatient follow-up.  #9 multiple falls lives alone PT recommends SNF.  She is requesting to go to CIR.  CIR consult placed.  #10 right buttock bruises/hematoma status post fall at home  #11 erythema to the right f heel keep legs elevated and floating  #  12 elevated LFTs improving continue to hold statin.    #13 constipation start MiraLAX  Nutrition Problem: Inadequate oral intake Etiology: poor appetite   Signs/Symptoms: per patient/family report    Interventions: Refer to RD note for recommendations  Estimated body mass  index is 29.89 kg/m as calculated from the following:   Height as of this encounter: 5\' 6"  (1.676 m).   Weight as of this encounter: 84 kg.  DVT prophylaxis: None due to large hematoma of the right buttocks and multiple falls Code Status: Full code  family Communication: None at bedside  disposition Plan:  Status is: Inpatient Remains inpatient appropriate because: IV fluids PT eval pending multiple falls large buttock hematoma   Consultants:  None  Procedures: None Antimicrobials: Rocephin Subjective:  She reports feeling better constipation resolved pain in the right buttocks better   Vitals:   11/16/21 2036 11/16/21 2050 11/17/21 0500 11/17/21 0617  BP: 135/86 133/62  133/84  Pulse: (!) 105 75  (!) 101  Resp: 18 18  17   Temp: 98.4 F (36.9 C) 97.7 F (36.5 C)  98.3 F (36.8 C)  TempSrc: Oral Oral  Oral  SpO2: 93% 100%  96%  Weight:   84 kg   Height:        Intake/Output Summary (Last 24 hours) at 11/17/2021 1113 Last data filed at 11/17/2021 0617 Gross per 24 hour  Intake 2389.24 ml  Output 650 ml  Net 1739.24 ml    Filed Weights   11/15/21 0407 11/16/21 0500 11/17/21 0500  Weight: 83 kg 84.1 kg 84 kg    Examination:  General exam: Appears frail elderly female Respiratory system: Clear to auscultation. Respiratory effort normal. Cardiovascular system: S1 & S2 heard, RRR. No JVD, murmurs, rubs, gallops or clicks. No pedal edema. Gastrointestinal system: Abdomen is nondistended, soft and nontender. No organomegaly or masses felt. Normal bowel sounds heard. Central nervous system: Alert and oriented. No focal neurological deficits. Extremities: Trace bilateral pitting edema erythema to the right heel noted skin: Right buttock bruises  psychiatry: Judgement and insight appear normal. Mood & affect appropriate.     Data Reviewed: I have personally reviewed following labs and imaging studies  CBC: Recent Labs  Lab 11/12/21 1420 11/12/21 1432  11/13/21 2011 11/14/21 0607 11/15/21 0510 11/16/21 0127 11/17/21 0302  WBC 6.4  --   --  9.4 6.9 7.3 8.1  NEUTROABS 4.8  --   --  7.0 5.0 5.2 5.8  HGB 10.6*   < > 11.1* 10.7* 9.8* 9.1* 9.8*  HCT 34.5*   < > 34.8* 31.7* 31.1* 28.9* 30.3*  MCV 90.3  --   --  85.4 87.6 89.2 88.6  PLT 164  --   --  144* 144* 156 184   < > = values in this interval not displayed.    Basic Metabolic Panel: Recent Labs  Lab 11/12/21 1420 11/12/21 1432 11/14/21 0607 11/15/21 0510 11/16/21 0127 11/17/21 0302  NA 142 140 140 139 138 139  K 4.0 3.9 4.0 4.1 4.1 4.5  CL 107 107 106 108 108 108  CO2 27  --  24 25 25 26   GLUCOSE 116* 111* 103* 102* 100* 104*  BUN 22 24* 22 18 18 14   CREATININE 0.97 1.00 1.00 0.81 0.75 0.64  CALCIUM 8.8*  --  8.1* 7.9* 7.9* 8.0*  MG  --   --  1.6* 1.8 1.7 1.6*  PHOS  --   --  2.6 2.5 2.6 2.2*  GFR: Estimated Creatinine Clearance: 64.3 mL/min (by C-G formula based on SCr of 0.64 mg/dL). Liver Function Tests: Recent Labs  Lab 11/13/21 1050 11/14/21 0607 11/15/21 0510 11/16/21 0127 11/17/21 0302  AST  --  53* 47* 45* 61*  ALT  --  58* 49* 44 51*  ALKPHOS  --  65 68 65 77  BILITOT  --  0.8 0.6 1.0 1.7*  PROT  --  4.6* 4.3* 4.4* 4.6*  ALBUMIN 2.4* 2.3* 2.0* 2.3* 2.3*    No results for input(s): LIPASE, AMYLASE in the last 168 hours. No results for input(s): AMMONIA in the last 168 hours. Coagulation Profile: Recent Labs  Lab 11/12/21 1420 11/13/21 2011  INR 1.6* 1.3*    Cardiac Enzymes: No results for input(s): CKTOTAL, CKMB, CKMBINDEX, TROPONINI in the last 168 hours. BNP (last 3 results) No results for input(s): PROBNP in the last 8760 hours. HbA1C: No results for input(s): HGBA1C in the last 72 hours. CBG: No results for input(s): GLUCAP in the last 168 hours. Lipid Profile: No results for input(s): CHOL, HDL, LDLCALC, TRIG, CHOLHDL, LDLDIRECT in the last 72 hours. Thyroid Function Tests: No results for input(s): TSH, T4TOTAL, FREET4,  T3FREE, THYROIDAB in the last 72 hours.  Anemia Panel: Recent Labs    11/16/21 0127 11/16/21 1057  VITAMINB12  --  380  FOLATE  --  10.7  FERRITIN  --  86  TIBC  --  242*  IRON  --  51  RETICCTPCT 2.2  --    Sepsis Labs: No results for input(s): PROCALCITON, LATICACIDVEN in the last 168 hours.  Recent Results (from the past 240 hour(s))  Resp Panel by RT-PCR (Flu A&B, Covid) Nasopharyngeal Swab     Status: None   Collection Time: 11/12/21  2:06 PM   Specimen: Nasopharyngeal Swab; Nasopharyngeal(NP) swabs in vial transport medium  Result Value Ref Range Status   SARS Coronavirus 2 by RT PCR NEGATIVE NEGATIVE Final    Comment: (NOTE) SARS-CoV-2 target nucleic acids are NOT DETECTED.  The SARS-CoV-2 RNA is generally detectable in upper respiratory specimens during the acute phase of infection. The lowest concentration of SARS-CoV-2 viral copies this assay can detect is 138 copies/mL. A negative result does not preclude SARS-Cov-2 infection and should not be used as the sole basis for treatment or other patient management decisions. A negative result may occur with  improper specimen collection/handling, submission of specimen other than nasopharyngeal swab, presence of viral mutation(s) within the areas targeted by this assay, and inadequate number of viral copies(<138 copies/mL). A negative result must be combined with clinical observations, patient history, and epidemiological information. The expected result is Negative.  Fact Sheet for Patients:  EntrepreneurPulse.com.au  Fact Sheet for Healthcare Providers:  IncredibleEmployment.be  This test is no t yet approved or cleared by the [place] FDA and  has been authorized for detection and/or diagnosis of SARS-CoV-2 by FDA under an Emergency Use Authorization (EUA). This EUA will remain  in effect (meaning this test can be used) for the duration of the COVID-19 declaration under  Section 564(b)(1) of the Act, 21 U.S.C.section 360bbb-3(b)(1), unless the authorization is terminated  or revoked sooner.       Influenza A by PCR NEGATIVE NEGATIVE Final   Influenza B by PCR NEGATIVE NEGATIVE Final    Comment: (NOTE) The Xpert Xpress SARS-CoV-2/FLU/RSV plus assay is intended as an aid in the diagnosis of influenza from Nasopharyngeal swab specimens and should not be used as a sole basis for  treatment. Nasal washings and aspirates are unacceptable for Xpert Xpress SARS-CoV-2/FLU/RSV testing.  Fact Sheet for Patients: EntrepreneurPulse.com.au  Fact Sheet for Healthcare Providers: IncredibleEmployment.be  This test is not yet approved or cleared by the [place] FDA and has been authorized for detection and/or diagnosis of SARS-CoV-2 by FDA under an Emergency Use Authorization (EUA). This EUA will remain in effect (meaning this test can be used) for the duration of the COVID-19 declaration under Section 564(b)(1) of the Act, 21 U.S.C. section 360bbb-3(b)(1), unless the authorization is terminated or revoked.  Performed at [facility], [place] [address], [place], [place] [address]   Urine Culture     Status: Abnormal   Collection Time: 11/12/21  8:28 PM   Specimen: Urine, Clean Catch  Result Value Ref Range Status   Specimen Description URINE, CLEAN CATCH  Final   Special Requests   Final    NONE Performed at [facility], [place] [address] [place], [place] [address]    Culture (A)  Final    >=100,000 COLONIES/mL ESCHERICHIA COLI Confirmed Extended Spectrum Beta-Lactamase Producer (ESBL).  In bloodstream infections from ESBL organisms, carbapenems are preferred over piperacillin/tazobactam. They are shown to have a lower risk of mortality.    Report Status 11/15/2021 FINAL  Final   Organism ID, Bacteria ESCHERICHIA COLI (A)  Final      Susceptibility   Escherichia coli - MIC*    AMPICILLIN >=32 RESISTANT  Resistant     CEFAZOLIN >=64 RESISTANT Resistant     CEFEPIME 16 RESISTANT Resistant     CEFTRIAXONE >=64 RESISTANT Resistant     CIPROFLOXACIN >=4 RESISTANT Resistant     GENTAMICIN <=1 SENSITIVE Sensitive     IMIPENEM <=0.25 SENSITIVE Sensitive     NITROFURANTOIN <=16 SENSITIVE Sensitive     TRIMETH/SULFA <=20 SENSITIVE Sensitive     AMPICILLIN/SULBACTAM 8 SENSITIVE Sensitive     PIP/TAZO <=4 SENSITIVE Sensitive     * >=100,000 COLONIES/mL ESCHERICHIA COLI          Radiology Studies: No results found.      Scheduled Meds:  amiodarone  200 mg Oral BID   feeding supplement  237 mL Oral TID BM   levothyroxine  125 mcg Oral Q0600   midodrine  5 mg Oral TID WC   nitrofurantoin (macrocrystal-monohydrate)  100 mg Oral Q12H   phenazopyridine  100 mg Oral TID WC   polyethylene glycol  17 g Oral Daily   Continuous Infusions:     LOS: 5 days    Time spent: 44 minutes    Georgette Shell, MD2/25/2023, 11:13 AM

## 2021-11-18 ENCOUNTER — Inpatient Hospital Stay (HOSPITAL_COMMUNITY): Payer: Medicare Other

## 2021-11-18 DIAGNOSIS — I951 Orthostatic hypotension: Secondary | ICD-10-CM | POA: Diagnosis not present

## 2021-11-18 LAB — CBC WITH DIFFERENTIAL/PLATELET
Abs Immature Granulocytes: 0.09 10*3/uL — ABNORMAL HIGH (ref 0.00–0.07)
Basophils Absolute: 0.1 10*3/uL (ref 0.0–0.1)
Basophils Relative: 1 %
Eosinophils Absolute: 0.2 10*3/uL (ref 0.0–0.5)
Eosinophils Relative: 2 %
HCT: 32 % — ABNORMAL LOW (ref 36.0–46.0)
Hemoglobin: 10.1 g/dL — ABNORMAL LOW (ref 12.0–15.0)
Immature Granulocytes: 1 %
Lymphocytes Relative: 7 %
Lymphs Abs: 0.6 10*3/uL — ABNORMAL LOW (ref 0.7–4.0)
MCH: 28 pg (ref 26.0–34.0)
MCHC: 31.6 g/dL (ref 30.0–36.0)
MCV: 88.6 fL (ref 80.0–100.0)
Monocytes Absolute: 1.1 10*3/uL — ABNORMAL HIGH (ref 0.1–1.0)
Monocytes Relative: 13 %
Neutro Abs: 6.5 10*3/uL (ref 1.7–7.7)
Neutrophils Relative %: 76 %
Platelets: 201 10*3/uL (ref 150–400)
RBC: 3.61 MIL/uL — ABNORMAL LOW (ref 3.87–5.11)
RDW: 18.3 % — ABNORMAL HIGH (ref 11.5–15.5)
WBC: 8.6 10*3/uL (ref 4.0–10.5)
nRBC: 0 % (ref 0.0–0.2)

## 2021-11-18 LAB — COMPREHENSIVE METABOLIC PANEL
ALT: 58 U/L — ABNORMAL HIGH (ref 0–44)
AST: 74 U/L — ABNORMAL HIGH (ref 15–41)
Albumin: 2.4 g/dL — ABNORMAL LOW (ref 3.5–5.0)
Alkaline Phosphatase: 83 U/L (ref 38–126)
Anion gap: 7 (ref 5–15)
BUN: 12 mg/dL (ref 8–23)
CO2: 26 mmol/L (ref 22–32)
Calcium: 8 mg/dL — ABNORMAL LOW (ref 8.9–10.3)
Chloride: 105 mmol/L (ref 98–111)
Creatinine, Ser: 0.7 mg/dL (ref 0.44–1.00)
GFR, Estimated: >60 mL/min (ref >60)
Glucose, Bld: 107 mg/dL — ABNORMAL HIGH (ref 70–99)
Potassium: 4.1 mmol/L (ref 3.5–5.1)
Sodium: 138 mmol/L (ref 135–145)
Total Bilirubin: 2.1 mg/dL — ABNORMAL HIGH (ref 0.3–1.2)
Total Protein: 4.8 g/dL — ABNORMAL LOW (ref 6.5–8.1)

## 2021-11-18 LAB — MAGNESIUM: Magnesium: 2.3 mg/dL (ref 1.7–2.4)

## 2021-11-18 LAB — PHOSPHORUS: Phosphorus: 2.6 mg/dL (ref 2.5–4.6)

## 2021-11-18 MED ORDER — FUROSEMIDE 10 MG/ML IJ SOLN
20.0000 mg | Freq: Once | INTRAMUSCULAR | Status: AC
  Administered 2021-11-18: 20 mg via INTRAVENOUS
  Filled 2021-11-18: qty 2

## 2021-11-18 MED ORDER — FUROSEMIDE 10 MG/ML IJ SOLN: 20.0000 mg | Freq: Once | INTRAMUSCULAR | Status: DC

## 2021-11-18 MED ORDER — ONDANSETRON HCL 4 MG/2ML IJ SOLN: 4.0000 mg | Freq: Four times a day (QID) | INTRAMUSCULAR | Status: DC | PRN

## 2021-11-18 NOTE — Progress Notes (Signed)
Mobility Specialist Progress Note    11/18/21 1325  Mobility  Bed Position Chair  Activity Ambulated with assistance in room  Level of Assistance Moderate assist, patient does 50-74%  Assistive Device Front wheel walker  Distance Ambulated (ft) 56 ft (20+36)  Activity Response Tolerated fair  $Mobility charge 1 Mobility   Pt received in bed and agreeable. No complaints during. Took x1 seated rest break. After settling into chair pt vomited, RN notified. Left with call bell in reach and visitor present.   [facility] Mobility Specialist  M.S. 5N: [phone]

## 2021-11-18 NOTE — Progress Notes (Signed)
PROGRESS NOTE    MARGALIT LEECE  BJS:[id number] DOB: 1946/06/21 DOA: 11/12/2021 PCP: [name], MD  Brief Narrative: 76 year old female lives at home alone with multiple falls.  She has history of atrial fibrillation heart block status post permanent pacemaker, chronic hypotension on midodrine Chest x-ray not suggestive of pneumonia, rib fractures, or pneumothorax.     Pelvic x-ray negative for acute finding.     CT head and C-spine negative for acute finding; showing small right-sided effusion in the upper right chest at the apex measures water density but is incompletely evaluated.  Previously noted to have a right-sided effusion in June 2022 that did not track to the level of the apex.   CT renal stone study showing marked volume of air within the bladder, including throughout the bladder walls, compatible with a severe emphysematous cystitis.  Showing a large right gluteal hematoma measuring approximately 15 x 9 x 11 cm.  Showing a small to moderate size right pleural effusion, incompletely imaged.  Hyperdense liver suspicious for hemochromatosis.   CT with additional views done: "No recent fracture or dislocation is seen in the right hip. There are large hematomas in the right gluteal muscles and in the subcutaneous plane in the right gluteal region extending to the posterior upper right thigh. There is 9.9 x 7 cm hematoma in the right gluteus maximus. There is 10.2 x 5.6 cm subcutaneous hematoma posterior to the gluteal muscles. In addition, there is extensive subcutaneous edema posterior to the gluteal and subcutaneous hematomas suggesting contusion extending from the right gluteal region and along the posterior right upper thigh."  Assessment & Plan:   Principal Problem:   Orthostatic hypotension Active Problems:   HYPERLIPIDEMIA   Pleural effusion   Hypothyroidism   Persistent atrial fibrillation ([place])   Fall   Traumatic hematoma of buttock   Emphysematous  cystitis   Abnormal finding on imaging of liver   History of fall   History of hypotension   Pressure ulcer with suspected deep tissue injury, stage II (HCC)   Prolonged QT interval   #1 orthostatic hypotension with syncope-her blood pressure was 58/32 with EMS.  Patient has had multiple falls at home prior to admission.  Pacemaker interrogated and shows no signs of arrhythmia. Blood pressure improved to 139/84, will DC IV fluids. She received albumin x2. Cortisol 16.6   Echocardiogram -ejection fraction 55 to 60%.  No regional wall motion abnormalities.  No LVH. TSH 0.049 Synthroid dose adjusted. Continue midodrine 5 mg 3 times a day.  #2 chronic persistent atrial fibrillation with RVR -rate controlled on amiodarone.  Eliquis still on hold due to large right gluteal hematoma.    #3 anemia due to traumatic hematoma to the right buttocks status post 2 units of packed RBC, hemoglobin stable at 10.1  #4 emphysematous cystitis with patient complaining of dysuria -urine culture with ESBL E. coli.  Started Macrobid 11/15/2021 Patient complaining of bladder spasms started on Pyridium last night.  #5 hypothyroidism on Synthroid TSH is 0.049 Synthroid dose adjusted.  #6 hyperlipidemia holding statin due to elevated LFTs  #7 hypomagnesemia mag 1.6 replete.    #8?  Hemochromatosis of the liver CT with findings suspicious for hemochromatosis of the liver.  Will need outpatient follow-up.  #9 multiple falls lives alone PT recommends SNF.  She is requesting to go to CIR.  CIR consult placed.  #10 right buttock bruises/hematoma status post fall at home  #11 erythema to the right f heel keep legs elevated and  floating  #12 elevated LFTs improving continue to hold statin.    #13 constipation start MiraLAX  #14 shortness of breath patient was complaining of short of breath which is new this morning.  Chest x-ray shows right lower effusion versus atelectasis, pneumonia could not be ruled out.   Will encourage her to use incentive spirometry aggressively.  Give Lasix 20 mg IV x1 hold off on antibiotics and will monitor overnight.  Nutrition Problem: Inadequate oral intake Etiology: poor appetite   Signs/Symptoms: per patient/family report    Interventions: Refer to RD note for recommendations  Estimated body mass index is 29.89 kg/m as calculated from the following:   Height as of this encounter: 5\' 6"  (1.676 m).   Weight as of this encounter: 84 kg.  DVT prophylaxis: None due to large hematoma of the right buttocks and multiple falls Code Status: Full code  family Communication: None at bedside  disposition Plan:  Status is: Inpatient Remains inpatient appropriate because: IV fluids PT eval pending multiple falls large buttock hematoma   Consultants:  None  Procedures: None Antimicrobials: Rocephin Subjective:  She reports feeling better constipation resolved pain in the right buttocks better   Vitals:   11/17/21 1624 11/17/21 2056 11/18/21 0646 11/18/21 1000  BP: (!) 141/97 (!) 127/96 (!) 137/92 (!) 149/89  Pulse: (!) 106 (!) 103 (!) 107 (!) 108  Resp: 16 16 17 18   Temp: 98.5 F (36.9 C) 98.3 F (36.8 C) 98.1 F (36.7 C) 97.9 F (36.6 C)  TempSrc: Oral Oral Axillary Oral  SpO2: 93% 92% 94% 91%  Weight:      Height:        Intake/Output Summary (Last 24 hours) at 11/18/2021 1324 Last data filed at 11/18/2021 1309 Gross per 24 hour  Intake 360 ml  Output 2150 ml  Net -1790 ml    Filed Weights   11/15/21 0407 11/16/21 0500 11/17/21 0500  Weight: 83 kg 84.1 kg 84 kg    Examination:  General exam: Appears frail elderly female Respiratory system: Decreased breath sounds at the right base to auscultation. Respiratory effort normal. Cardiovascular system: S1 & S2 heard, RRR. No JVD, murmurs, rubs, gallops or clicks. No pedal edema. Gastrointestinal system: Abdomen is nondistended, soft and nontender. No organomegaly or masses felt. Normal bowel  sounds heard. Central nervous system: Alert and oriented. No focal neurological deficits. Extremities: Trace bilateral pitting edema erythema to the right heel noted skin: Right buttock bruises  psychiatry: Judgement and insight appear normal. Mood & affect appropriate.     Data Reviewed: I have personally reviewed following labs and imaging studies  CBC: Recent Labs  Lab 11/14/21 0607 11/15/21 0510 11/16/21 0127 11/17/21 0302 11/18/21 0033  WBC 9.4 6.9 7.3 8.1 8.6  NEUTROABS 7.0 5.0 5.2 5.8 6.5  HGB 10.7* 9.8* 9.1* 9.8* 10.1*  HCT 31.7* 31.1* 28.9* 30.3* 32.0*  MCV 85.4 87.6 89.2 88.6 88.6  PLT 144* 144* 156 184 563    Basic Metabolic Panel: Recent Labs  Lab 11/14/21 0607 11/15/21 0510 11/16/21 0127 11/17/21 0302 11/18/21 0033  NA 140 139 138 139 138  K 4.0 4.1 4.1 4.5 4.1  CL 106 108 108 108 105  CO2 24 25 25 26 26   GLUCOSE 103* 102* 100* 104* 107*  BUN 22 18 18 14 12   CREATININE 1.00 0.81 0.75 0.64 0.70  CALCIUM 8.1* 7.9* 7.9* 8.0* 8.0*  MG 1.6* 1.8 1.7 1.6* 2.3  PHOS 2.6 2.5 2.6 2.2* 2.6    GFR:  Estimated Creatinine Clearance: 64.3 mL/min (by C-G formula based on SCr of 0.7 mg/dL). Liver Function Tests: Recent Labs  Lab 11/14/21 0607 11/15/21 0510 11/16/21 0127 11/17/21 0302 11/18/21 0033  AST 53* 47* 45* 61* 74*  ALT 58* 49* 44 51* 58*  ALKPHOS 65 68 65 77 83  BILITOT 0.8 0.6 1.0 1.7* 2.1*  PROT 4.6* 4.3* 4.4* 4.6* 4.8*  ALBUMIN 2.3* 2.0* 2.3* 2.3* 2.4*    No results for input(s): LIPASE, AMYLASE in the last 168 hours. No results for input(s): AMMONIA in the last 168 hours. Coagulation Profile: Recent Labs  Lab 11/12/21 1420 11/13/21 2011  INR 1.6* 1.3*    Cardiac Enzymes: No results for input(s): CKTOTAL, CKMB, CKMBINDEX, TROPONINI in the last 168 hours. BNP (last 3 results) No results for input(s): PROBNP in the last 8760 hours. HbA1C: No results for input(s): HGBA1C in the last 72 hours. CBG: No results for input(s): GLUCAP in  the last 168 hours. Lipid Profile: No results for input(s): CHOL, HDL, LDLCALC, TRIG, CHOLHDL, LDLDIRECT in the last 72 hours. Thyroid Function Tests: No results for input(s): TSH, T4TOTAL, FREET4, T3FREE, THYROIDAB in the last 72 hours.  Anemia Panel: Recent Labs    11/16/21 0127 11/16/21 1057  VITAMINB12  --  380  FOLATE  --  10.7  FERRITIN  --  86  TIBC  --  242*  IRON  --  51  RETICCTPCT 2.2  --     Sepsis Labs: No results for input(s): PROCALCITON, LATICACIDVEN in the last 168 hours.  Recent Results (from the past 240 hour(s))  Resp Panel by RT-PCR (Flu A&B, Covid) Nasopharyngeal Swab     Status: None   Collection Time: 11/12/21  2:06 PM   Specimen: Nasopharyngeal Swab; Nasopharyngeal(NP) swabs in vial transport medium  Result Value Ref Range Status   SARS Coronavirus 2 by RT PCR NEGATIVE NEGATIVE Final    Comment: (NOTE) SARS-CoV-2 target nucleic acids are NOT DETECTED.  The SARS-CoV-2 RNA is generally detectable in upper respiratory specimens during the acute phase of infection. The lowest concentration of SARS-CoV-2 viral copies this assay can detect is 138 copies/mL. A negative result does not preclude SARS-Cov-2 infection and should not be used as the sole basis for treatment or other patient management decisions. A negative result may occur with  improper specimen collection/handling, submission of specimen other than nasopharyngeal swab, presence of viral mutation(s) within the areas targeted by this assay, and inadequate number of viral copies(<138 copies/mL). A negative result must be combined with clinical observations, patient history, and epidemiological information. The expected result is Negative.  Fact Sheet for Patients:  EntrepreneurPulse.com.au  Fact Sheet for Healthcare Providers:  IncredibleEmployment.be  This test is no t yet approved or cleared by the [place] FDA and  has been authorized for  detection and/or diagnosis of SARS-CoV-2 by FDA under an Emergency Use Authorization (EUA). This EUA will remain  in effect (meaning this test can be used) for the duration of the COVID-19 declaration under Section 564(b)(1) of the Act, 21 U.S.C.section 360bbb-3(b)(1), unless the authorization is terminated  or revoked sooner.       Influenza A by PCR NEGATIVE NEGATIVE Final   Influenza B by PCR NEGATIVE NEGATIVE Final    Comment: (NOTE) The Xpert Xpress SARS-CoV-2/FLU/RSV plus assay is intended as an aid in the diagnosis of influenza from Nasopharyngeal swab specimens and should not be used as a sole basis for treatment. Nasal washings and aspirates are unacceptable for Xpert Xpress  SARS-CoV-2/FLU/RSV testing.  Fact Sheet for Patients: EntrepreneurPulse.com.au  Fact Sheet for Healthcare Providers: IncredibleEmployment.be  This test is not yet approved or cleared by the [place] FDA and has been authorized for detection and/or diagnosis of SARS-CoV-2 by FDA under an Emergency Use Authorization (EUA). This EUA will remain in effect (meaning this test can be used) for the duration of the COVID-19 declaration under Section 564(b)(1) of the Act, 21 U.S.C. section 360bbb-3(b)(1), unless the authorization is terminated or revoked.  Performed at [facility], [place] [address] [place], [place] [address]   Urine Culture     Status: Abnormal   Collection Time: 11/12/21  8:28 PM   Specimen: Urine, Clean Catch  Result Value Ref Range Status   Specimen Description URINE, CLEAN CATCH  Final   Special Requests   Final    NONE Performed at [facility], [place] [address], [place],  [address]    Culture (A)  Final    >=100,000 COLONIES/mL ESCHERICHIA COLI Confirmed Extended Spectrum Beta-Lactamase Producer (ESBL).  In bloodstream infections from ESBL organisms, carbapenems are preferred over piperacillin/tazobactam. They are shown to  have a lower risk of mortality.    Report Status 11/15/2021 FINAL  Final   Organism ID, Bacteria ESCHERICHIA COLI (A)  Final      Susceptibility   Escherichia coli - MIC*    AMPICILLIN >=32 RESISTANT Resistant     CEFAZOLIN >=64 RESISTANT Resistant     CEFEPIME 16 RESISTANT Resistant     CEFTRIAXONE >=64 RESISTANT Resistant     CIPROFLOXACIN >=4 RESISTANT Resistant     GENTAMICIN <=1 SENSITIVE Sensitive     IMIPENEM <=0.25 SENSITIVE Sensitive     NITROFURANTOIN <=16 SENSITIVE Sensitive     TRIMETH/SULFA <=20 SENSITIVE Sensitive     AMPICILLIN/SULBACTAM 8 SENSITIVE Sensitive     PIP/TAZO <=4 SENSITIVE Sensitive     * >=100,000 COLONIES/mL ESCHERICHIA COLI          Radiology Studies: DG Chest Port 1 View  Result Date: 11/18/2021 CLINICAL DATA:  Short of breath. EXAM: PORTABLE CHEST 1 VIEW COMPARISON:  11/12/2021 and older exams. FINDINGS: Hazy opacity extends from the right mid lung to the right lung base, partly obscuring the hemidiaphragm. There are associated interstitial type opacities most evident in the right mid lung and medial right lung base. Right upper lung is clear. Left lung is clear. No pneumothorax. Cardiac silhouette is normal in size. Stable left anterior chest wall sequential pacemaker and left atrial occlusion device. No mediastinal or hilar masses. Previous thyroid surgery, also stable. Partly imaged reverse right shoulder prosthesis appears well aligned. IMPRESSION: 1. Right mid to lower lung opacity, increased compared to the prior exam, consistent with a combination of a right pleural effusion with atelectasis, infection or a combination. 2. No other evidence of acute cardiopulmonary disease. No pulmonary edema. Electronically Signed   By: Lajean Manes M.D.   On: 11/18/2021 11:27        Scheduled Meds:  amiodarone  200 mg Oral BID   feeding supplement  237 mL Oral TID BM   levothyroxine  125 mcg Oral Q0600   magnesium oxide  400 mg Oral Daily   midodrine   5 mg Oral TID WC   nitrofurantoin (macrocrystal-monohydrate)  100 mg Oral Q12H   polyethylene glycol  17 g Oral Daily   Continuous Infusions:     LOS: 6 days    Time spent: 44 minutes    Georgette Shell,  MD2/26/2023, 1:24 PM

## 2021-11-19 ENCOUNTER — Ambulatory Visit: Payer: Medicare Other | Admitting: Physical Medicine and Rehabilitation

## 2021-11-19 DIAGNOSIS — I951 Orthostatic hypotension: Secondary | ICD-10-CM | POA: Diagnosis not present

## 2021-11-19 LAB — CBC WITH DIFFERENTIAL/PLATELET
Abs Immature Granulocytes: 0.13 10*3/uL — ABNORMAL HIGH (ref 0.00–0.07)
Basophils Absolute: 0.1 10*3/uL (ref 0.0–0.1)
Basophils Relative: 1 %
Eosinophils Absolute: 0.1 10*3/uL (ref 0.0–0.5)
Eosinophils Relative: 1 %
HCT: 32.2 % — ABNORMAL LOW (ref 36.0–46.0)
Hemoglobin: 10.2 g/dL — ABNORMAL LOW (ref 12.0–15.0)
Immature Granulocytes: 1 %
Lymphocytes Relative: 6 %
Lymphs Abs: 0.6 10*3/uL — ABNORMAL LOW (ref 0.7–4.0)
MCH: 28 pg (ref 26.0–34.0)
MCHC: 31.7 g/dL (ref 30.0–36.0)
MCV: 88.5 fL (ref 80.0–100.0)
Monocytes Absolute: 1.4 10*3/uL — ABNORMAL HIGH (ref 0.1–1.0)
Monocytes Relative: 13 %
Neutro Abs: 8 10*3/uL — ABNORMAL HIGH (ref 1.7–7.7)
Neutrophils Relative %: 78 %
Platelets: 210 10*3/uL (ref 150–400)
RBC: 3.64 MIL/uL — ABNORMAL LOW (ref 3.87–5.11)
RDW: 18.5 % — ABNORMAL HIGH (ref 11.5–15.5)
WBC: 10.3 10*3/uL (ref 4.0–10.5)
nRBC: 0 % (ref 0.0–0.2)

## 2021-11-19 LAB — RESP PANEL BY RT-PCR (FLU A&B, COVID) ARPGX2
Influenza A by PCR: NEGATIVE
Influenza B by PCR: NEGATIVE
SARS Coronavirus 2 by RT PCR: NEGATIVE

## 2021-11-19 LAB — COMPREHENSIVE METABOLIC PANEL
ALT: 58 U/L — ABNORMAL HIGH (ref 0–44)
AST: 69 U/L — ABNORMAL HIGH (ref 15–41)
Albumin: 2.4 g/dL — ABNORMAL LOW (ref 3.5–5.0)
Alkaline Phosphatase: 80 U/L (ref 38–126)
Anion gap: 7 (ref 5–15)
BUN: 15 mg/dL (ref 8–23)
CO2: 27 mmol/L (ref 22–32)
Calcium: 8.1 mg/dL — ABNORMAL LOW (ref 8.9–10.3)
Chloride: 102 mmol/L (ref 98–111)
Creatinine, Ser: 0.7 mg/dL (ref 0.44–1.00)
GFR, Estimated: >60 mL/min (ref >60)
Glucose, Bld: 108 mg/dL — ABNORMAL HIGH (ref 70–99)
Potassium: 3.8 mmol/L (ref 3.5–5.1)
Sodium: 136 mmol/L (ref 135–145)
Total Bilirubin: 2.1 mg/dL — ABNORMAL HIGH (ref 0.3–1.2)
Total Protein: 4.7 g/dL — ABNORMAL LOW (ref 6.5–8.1)

## 2021-11-19 LAB — PHOSPHORUS: Phosphorus: 3.1 mg/dL (ref 2.5–4.6)

## 2021-11-19 LAB — MAGNESIUM: Magnesium: 1.7 mg/dL (ref 1.7–2.4)

## 2021-11-19 MED ORDER — APIXABAN 5 MG PO TABS
5.0000 mg | ORAL_TABLET | Freq: Two times a day (BID) | ORAL | Status: DC
  Administered 2021-11-19 – 2021-11-20 (×3): 5 mg via ORAL
  Filled 2021-11-19 (×3): qty 1

## 2021-11-19 NOTE — TOC Progression Note (Addendum)
Transition of Care [facility]) - Progression Note    Patient Details  Name: Kayla Harrison MRN: [id number] Date of Birth: 1946-01-03  [facility]) CM/SW Contact  Emeterio Reeve, [place] Phone Number: 11/19/2021, 11:03 AM  Clinical Narrative:     CSW spoke with CIR and they declined admission. CSW met with pt at bedside and informed pt of CIRs decision. Pt expressed disappointment.   CSW discussed SNF choices with pt. CSW gave bed offers. Pt stated she will review and have answers for CSW after lunch.   Pt requested that her sister Kayla Harrison be taken off the contact list and her sister [facility] stay on the call contact list.   2:00pm- CSW spoke to pt at bedside. Pt stated she chose [facility]. CSW spoke with admissions coordinator who stated they can accept pt tomorrow. CSW informed MD and requested a covid tet.   Expected Discharge Plan: [place] Barriers to Discharge: Continued Medical Work up  Expected Discharge Plan and Services Expected Discharge Plan: [place] arrangements for the past 2 months: Single Family Home                                       Social Determinants of Health (SDOH) Interventions    Readmission Risk Interventions No flowsheet data found.  Emeterio Reeve, LCSW Clinical Social Worker

## 2021-11-19 NOTE — Progress Notes (Signed)
PT Cancellation Note  Patient Details Name: Kayla Harrison MRN: [id number] DOB: Apr 30, 1946   Cancelled Treatment:    Reason Eval/Treat Not Completed: Other (comment).  Unable to be seen due to just having MT see her.  Retry at another time.   Ramond Dial 11/19/2021, 1:12 PM  [name], PT PhD Acute Rehab Dept. Number: [place] and [place]

## 2021-11-19 NOTE — Progress Notes (Signed)
Inpatient Rehab Admissions Coordinator:  Discussed pt's dispo with physiatrist. Due to unreliable support, we will not offer pt a bed in CIR. TOC made aware. Also met with pt at bedside to inform her.  Will sign off.  Gayland Curry, [place], [place] Admissions Coordinator [phone]

## 2021-11-19 NOTE — Progress Notes (Signed)
Physical Therapy Treatment Patient Details Name: Kayla Harrison MRN: [id number] DOB: 05-21-1946 Today's Date: 11/19/2021   History of Present Illness Pt is a 76 y/o F presenting to ED on 2/20 after 2 falls at home, found to be hypotensive. PMH includes HTN, CVA (2022), multiple falls, A fib on eliquis, pacemaker status, thyroid and breast CA.    PT Comments    Pt was seen for progression of mobility on RW to get to bed and then to review some exercises.  Pt has a visitor who was there to help her take care of personal business,  as well as her lunch having arrived.  After a time PT stepped out to let pt complete her personal things, but recommend she continue to sit up in chair daily and do strengthening and ROM to BLE's.  Follow along for acute PT goals as are outlined below.  Recommendations for follow up therapy are one component of a multi-disciplinary discharge planning process, led by the attending physician.  Recommendations may be updated based on patient status, additional functional criteria and insurance authorization.  Follow Up Recommendations  Skilled nursing-short term rehab (<3 hours/day)     Assistance Recommended at Discharge Intermittent Supervision/Assistance  Patient can return home with the following A little help with bathing/dressing/bathroom;A little help with walking and/or transfers;Assistance with cooking/housework;Assist for transportation;Help with stairs or ramp for entrance   Equipment Recommendations  BSC/3in1    Recommendations for Other Services       Precautions / Restrictions Precautions Precautions: Fall Precaution Comments: monitor BP Restrictions Weight Bearing Restrictions: No     Mobility  Bed Mobility Overal bed mobility: Needs Assistance Bed Mobility: Sit to Supine       Sit to supine: Min assist   General bed mobility comments: minor help to support trunk and legs in sync to get to bed comfortably    Transfers Overall  transfer level: Needs assistance Equipment used: Rolling walker (2 wheels) Transfers: Sit to/from Stand, Bed to chair/wheelchair/BSC Sit to Stand: Min assist   Step pivot transfers: Min assist       General transfer comment: cued hand placement but is also sequencing with tendency to reach for walker    Ambulation/Gait Ambulation/Gait assistance: Min assist Gait Distance (Feet): 4 Feet Assistive device: Rolling walker (2 wheels) Gait Pattern/deviations: Step-to pattern, Knees buckling, Decreased stride length, Wide base of support Gait velocity: reduced   Pre-gait activities: standing balance cues and assistance for wgt shift laterally General Gait Details: assisted her to sidestep with cues for how to unload slightly weaker LLE, has better standing control with cues for sequencing walker   Stairs             Wheelchair Mobility    Modified Rankin (Stroke Patients Only)       Balance Overall balance assessment: Needs assistance Sitting-balance support: Feet supported Sitting balance-Leahy Scale: Fair     Standing balance support: Bilateral upper extremity supported, During functional activity Standing balance-Leahy Scale: Poor Standing balance comment: poor control and has trouble with recall of the effort she could deliver to stand and get to chair earlier.  Pt recalls standing 15 minutes straight                            Cognition Arousal/Alertness: Awake/alert Behavior During Therapy:  (safety awareness) Overall Cognitive Status: No family/caregiver present to determine baseline cognitive functioning  General Comments: pt may be at baseline        Exercises General Exercises - Lower Extremity Ankle Circles/Pumps: AAROM, 5 reps Quad Sets: AROM, 10 reps Gluteal Sets: AROM, 10 reps Heel Slides: AROM, 10 reps Hip ABduction/ADduction: AAROM, AROM, 10 reps    General Comments General comments  (skin integrity, edema, etc.): pt  was able to be assisted to [facility], requires two person help to stand for progressively shorter times per MT.  Pt does not recall her struggle to repetitively stand, instead has  memory of long controlled standing bout      Pertinent Vitals/Pain Pain Assessment Pain Assessment: No/denies pain    Home Living                          Prior Function            PT Goals (current goals can now be found in the care plan section) Acute Rehab PT Goals Patient Stated Goal: become able to get home and safe to walk PT Goal Formulation: With patient Progress towards PT goals: Progressing toward goals    Frequency    Min 3X/week      PT Plan Current plan remains appropriate    Co-evaluation              AM-PAC PT "6 Clicks" Mobility   Outcome Measure  Help needed turning from your back to your side while in a flat bed without using bedrails?: A Little Help needed moving from lying on your back to sitting on the side of a flat bed without using bedrails?: A Little Help needed moving to and from a bed to a chair (including a wheelchair)?: A Little Help needed standing up from a chair using your arms (e.g., wheelchair or bedside chair)?: A Little Help needed to walk in hospital room?: A Little Help needed climbing 3-5 steps with a railing? : Total 6 Click Score: 16    End of Session Equipment Utilized During Treatment: Gait belt Activity Tolerance: Patient limited by fatigue;Treatment limited secondary to medical complications (Comment) (orthostasis) Patient left: in bed;with call bell/phone within reach;with bed alarm set;with family/visitor present Nurse Communication: Mobility status PT Visit Diagnosis: Other abnormalities of gait and mobility (R26.89);History of falling (Z91.81)     Time: 0370-4888 PT Time Calculation (min) (ACUTE ONLY): 26 min  Charges:  $Therapeutic Exercise: 8-22 mins $Therapeutic Activity: 8-22 mins              Ramond Dial 11/19/2021, 4:51 PM  [name], PT PhD Acute Rehab Dept. Number: [place] and [place]

## 2021-11-19 NOTE — Progress Notes (Addendum)
Progress Note  Patient Name: Kayla Harrison Date of Encounter: 11/19/2021  [facility] Cardiologist: [name], MD   Subjective   Doing better, still with pain, doing better oOB, spending 1-1.5 hours in the chair intermittently  Inpatient Medications    Scheduled Meds:  amiodarone  200 mg Oral BID   feeding supplement  237 mL Oral TID BM   levothyroxine  125 mcg Oral Q0600   magnesium oxide  400 mg Oral Daily   midodrine  5 mg Oral TID WC   nitrofurantoin (macrocrystal-monohydrate)  100 mg Oral Q12H   polyethylene glycol  17 g Oral Daily   Continuous Infusions:  PRN Meds: acetaminophen **OR** acetaminophen, ondansetron (ZOFRAN) IV, oxyCODONE-acetaminophen   Vital Signs    Vitals:   11/18/21 2157 11/19/21 0500 11/19/21 0549 11/19/21 0751  BP: 139/90  119/88 133/85  Pulse: (!) 109  (!) 107 (!) 105  Resp: 18  18 18   Temp: 97.6 F (36.4 C)  97.9 F (36.6 C) 97.8 F (36.6 C)  TempSrc: Oral  Oral Oral  SpO2: 99%  95% 98%  Weight:  84.5 kg    Height:        Intake/Output Summary (Last 24 hours) at 11/19/2021 0754 Last data filed at 11/19/2021 0549 Gross per 24 hour  Intake 360 ml  Output 2500 ml  Net -2140 ml   Last 3 Weights 11/19/2021 11/17/2021 11/16/2021  Weight (lbs) 186 lb 4.6 oz 185 lb 3 oz 185 lb 6.5 oz  Weight (kg) 84.5 kg 84 kg 84.1 kg      Telemetry    AFib 100's - Personally Reviewed  ECG    No new EKgs - Personally Reviewed  Physical Exam   GEN: No acute distress.   Neck: No JVD Cardiac: irreg-irreg, 2/6 SM, no rubs, or gallops.  Respiratory: diminished R base. GI: Soft, nontender, non-distended  MS: No edema; No deformity. Neuro:  Nonfocal  Psych: Normal affect   Labs    High Sensitivity Troponin:   Recent Labs  Lab 11/13/21 0241  TROPONINIHS 17     Chemistry Recent Labs  Lab 11/17/21 0302 11/18/21 0033 11/19/21 0205  NA 139 138 136  K 4.5 4.1 3.8  CL 108 105 102  CO2 26 26 27   GLUCOSE 104* 107* 108*  BUN 14  12 15   CREATININE 0.64 0.70 0.70  CALCIUM 8.0* 8.0* 8.1*  MG 1.6* 2.3 1.7  PROT 4.6* 4.8* 4.7*  ALBUMIN 2.3* 2.4* 2.4*  AST 61* 74* 69*  ALT 51* 58* 58*  ALKPHOS 77 83 80  BILITOT 1.7* 2.1* 2.1*  GFRNONAA >60 >60 >60  ANIONGAP 5 7 7     Lipids No results for input(s): CHOL, TRIG, HDL, LABVLDL, LDLCALC, CHOLHDL in the last 168 hours.  Hematology Recent Labs  Lab 11/17/21 0302 11/18/21 0033 11/19/21 0205  WBC 8.1 8.6 10.3  RBC 3.42* 3.61* 3.64*  HGB 9.8* 10.1* 10.2*  HCT 30.3* 32.0* 32.2*  MCV 88.6 88.6 88.5  MCH 28.7 28.0 28.0  MCHC 32.3 31.6 31.7  RDW 18.2* 18.3* 18.5*  PLT 184 201 210   Thyroid  Recent Labs  Lab 11/14/21 0607  TSH 0.049*    BNP Recent Labs  Lab 11/13/21 0241  BNP 254.3*    DDimer  Recent Labs  Lab 11/13/21 0241  DDIMER 0.98*     Radiology    DG Chest Port 1 View Result Date: 11/18/2021 CLINICAL DATA:  Short of breath. EXAM: PORTABLE CHEST 1 VIEW  COMPARISON:  11/12/2021 and older exams. FINDINGS: Hazy opacity extends from the right mid lung to the right lung base, partly obscuring the hemidiaphragm. There are associated interstitial type opacities most evident in the right mid lung and medial right lung base. Right upper lung is clear. Left lung is clear. No pneumothorax. Cardiac silhouette is normal in size. Stable left anterior chest wall sequential pacemaker and left atrial occlusion device. No mediastinal or hilar masses. Previous thyroid surgery, also stable. Partly imaged reverse right shoulder prosthesis appears well aligned. IMPRESSION: 1. Right mid to lower lung opacity, increased compared to the prior exam, consistent with a combination of a right pleural effusion with atelectasis, infection or a combination. 2. No other evidence of acute cardiopulmonary disease. No pulmonary edema. Electronically Signed   By: Lajean Manes M.D.   On: 11/18/2021 11:27    Cardiac Studies   11/13/21: TTE  1. Left ventricular ejection fraction, by  estimation, is 55 to 60%. The  left ventricle has normal function. The left ventricle has no regional  wall motion abnormalities. Indeterminate diastolic filling due to E-A  fusion. There is the interventricular  septum is flattened in diastole ('D' shaped left ventricle), consistent  with right ventricular volume overload.   2. Right ventricular systolic function is mildly reduced. The right  ventricular size is mildly enlarged. There is mildly elevated pulmonary  artery systolic pressure.   3. Left atrial size was moderately dilated.   4. The mitral valve is degenerative. Moderate mitral valve regurgitation.  Mild mitral stenosis. The mean mitral valve gradient is 5.0 mmHg with  average heart rate of 122 bpm. Severe mitral annular calcification.   5. Tricuspid valve regurgitation is severe.   6. The aortic valve is normal in structure. There is mild calcification  of the aortic valve. There is mild thickening of the aortic valve. Aortic  valve regurgitation is not visualized. Aortic valve sclerosis is present,  with no evidence of aortic valve  stenosis.   7. The inferior vena cava is dilated in size with >50% respiratory  variability, suggesting right atrial pressure of 8 mmHg.   Comparison(s): Prior images reviewed side by side. Mitral insufficiency  appears to be less severe.   Patient Profile     76 y.o. female w/PMHx of thyroid Ca, hypothyroidism, NHL/SVC syndrome /lung cancer with radiation therapy as well as chemotherapy 2018 followed by Dr. Lindi Adie, AFib, HTN, HL:D, hx of lap band surgery, PPM, and known marked orthostatic hypotension/syncope/falls, prior stroke  Admitted 11/12/21 with fall and c/o HA and right hip/buttock pain after multiple falls. Upon EMS arrival, patient was found to be hypotensive with blood pressure 58/32 when sitting up and SBP 90 when laying down. Suffered a large R gluteal hematoma, her home Eliquis held and transfused  Assessment & Plan     AFib Rates low 100's CHA2DS2Vasc is 5  Home Eliquis held given large hematoma  Chronic amiodarone  Hemochromatosis of the liver CT with findings suspicious for hemochromatosis of the liver, abn LFts Plan is to pursue DCCV once back on uninterrupted a/c Timing to resume as per IM team  I don't see any orthostatics, pt reports dong better OOB  On O2 CXR yesterday with effusion/atelectasis (s/p IVF and blood this admission) S/p dose of lasix yesterday w/medicine Less SOB today  Looks like plans for rehab  For questions or updates, please contact [facility] Please consult [email or web address] for contact info under        Signed, Brien Few  Olevia Bowens  11/19/2021, 7:54 AM    As above   Lengthy discussion regarding disposition-- Continue amio Upright as much as possible

## 2021-11-19 NOTE — Progress Notes (Signed)
2/27 IM Letter mailed to patient's mailing address due to patient's isolation status.

## 2021-11-19 NOTE — Care Management Important Message (Signed)
Important Message  Patient Details  Name: Kayla Harrison MRN: [id number] Date of Birth: 10-Nov-1945   Medicare Important Message Given:  Other (see comment)     Hannah Beat 11/19/2021, 12:35 PM

## 2021-11-19 NOTE — Progress Notes (Addendum)
PROGRESS NOTE    Kayla Harrison  LXB:[id number] DOB: 09-26-1945 DOA: 11/12/2021 PCP: [name], MD  Brief Narrative: 76 year old female lives at home alone with multiple falls.  She has history of atrial fibrillation heart block status post permanent pacemaker, chronic hypotension on midodrine Chest x-ray not suggestive of pneumonia, rib fractures, or pneumothorax.     Pelvic x-ray negative for acute finding.     CT head and C-spine negative for acute finding; showing small right-sided effusion in the upper right chest at the apex measures water density but is incompletely evaluated.  Previously noted to have a right-sided effusion in June 2022 that did not track to the level of the apex.   CT renal stone study showing marked volume of air within the bladder, including throughout the bladder walls, compatible with a severe emphysematous cystitis.  Showing a large right gluteal hematoma measuring approximately 15 x 9 x 11 cm.  Showing a small to moderate size right pleural effusion, incompletely imaged.  Hyperdense liver suspicious for hemochromatosis.   CT with additional views done: "No recent fracture or dislocation is seen in the right hip. There are large hematomas in the right gluteal muscles and in the subcutaneous plane in the right gluteal region extending to the posterior upper right thigh. There is 9.9 x 7 cm hematoma in the right gluteus maximus. There is 10.2 x 5.6 cm subcutaneous hematoma posterior to the gluteal muscles. In addition, there is extensive subcutaneous edema posterior to the gluteal and subcutaneous hematomas suggesting contusion extending from the right gluteal region and along the posterior right upper thigh."  Assessment & Plan:   Principal Problem:   Orthostatic hypotension Active Problems:   HYPERLIPIDEMIA   Pleural effusion   Hypothyroidism   Persistent atrial fibrillation ([place])   Fall   Traumatic hematoma of buttock   Emphysematous  cystitis   Abnormal finding on imaging of liver   History of fall   History of hypotension   Pressure ulcer with suspected deep tissue injury, stage II (HCC)   Prolonged QT interval   #1 orthostatic hypotension with syncope-her blood pressure was 58/32 with EMS.  Patient has had multiple falls at home prior to admission.  Pacemaker interrogated and shows no signs of arrhythmia. Blood pressure improved to 139/84, will DC IV fluids. She received albumin x2. Cortisol 16.6   Echocardiogram -ejection fraction 55 to 60%.  No regional wall motion abnormalities.  No LVH. TSH 0.049 Synthroid dose adjusted. Continue midodrine 5 mg 3 times a day.  #2 chronic persistent atrial fibrillation with RVR -rate controlled on amiodarone.  Restarted Eliquis 11/19/2021 since hemoglobin had been stable.   #3 anemia due to traumatic hematoma to the right buttocks status post 2 units of packed RBC, hemoglobin stable at 10.1  #4 emphysematous cystitis with patient complaining of dysuria -urine culture with ESBL E. coli.  Started Macrobid 11/15/2021 Patient complaining of bladder spasms started on Pyridium last night.  #5 hypothyroidism on Synthroid TSH is 0.049 Synthroid dose adjusted.  #6 hyperlipidemia holding statin due to elevated LFTs  #7 hypomagnesemia mag 1.6 replete.    #8?  Hemochromatosis of the liver CT with findings suspicious for hemochromatosis of the liver.  Will need outpatient follow-up.  #9 multiple falls lives alone PT recommends SNF.  She is requesting to go to CIR.  CIR consult placed.  #10 right buttock bruises/hematoma status post fall at home Eliquis contributing to hematoma  #11 erythema to the right f heel keep legs elevated  and floating  #12 elevated LFTs improving continue to hold statin.    #13 constipation start MiraLAX  #14 shortness of breath patient was complaining of short of breath which is new this morning.  Chest x-ray shows right lower effusion versus atelectasis,  pneumonia could not be ruled out.  Will encourage her to use incentive spirometry aggressively. Nutrition Problem: Inadequate oral intake Etiology: poor appetite   Signs/Symptoms: per patient/family report    Interventions: Refer to RD note for recommendations  Estimated body mass index is 30.07 kg/m as calculated from the following:   Height as of this encounter: 5\' 6"  (1.676 m).   Weight as of this encounter: 84.5 kg.  DVT prophylaxis: None due to large hematoma of the right buttocks and multiple falls Code Status: Full code  family Communication: None at bedside  disposition Plan:  Status is: Inpatient Remains inpatient appropriate because: IV fluids PT eval pending multiple falls large buttock hematoma   Consultants:  None  Procedures: None Antimicrobials: Rocephin Subjective:  She is resting in bed in no acute distress denies any new complaints feels breathing is better and slept better Vitals:   11/18/21 2157 11/19/21 0500 11/19/21 0549 11/19/21 0751  BP: 139/90  119/88 133/85  Pulse: (!) 109  (!) 107 (!) 105  Resp: 18  18 18   Temp: 97.6 F (36.4 C)  97.9 F (36.6 C) 97.8 F (36.6 C)  TempSrc: Oral  Oral Oral  SpO2: 99%  95% 98%  Weight:  84.5 kg    Height:        Intake/Output Summary (Last 24 hours) at 11/19/2021 1437 Last data filed at 11/19/2021 0549 Gross per 24 hour  Intake 120 ml  Output 850 ml  Net -730 ml    Filed Weights   11/16/21 0500 11/17/21 0500 11/19/21 0500  Weight: 84.1 kg 84 kg 84.5 kg    Examination:  General exam: Appears frail elderly female Respiratory system: Decreased breath sounds at the right base to auscultation. Respiratory effort normal. Cardiovascular system: S1 & S2 heard, RRR. No JVD, murmurs, rubs, gallops or clicks. No pedal edema. Gastrointestinal system: Abdomen is nondistended, soft and nontender. No organomegaly or masses felt. Normal bowel sounds heard. Central nervous system: Alert and oriented. No focal  neurological deficits. Extremities: Trace bilateral pitting edema erythema to the right heel noted skin: Right buttock bruises  psychiatry: Judgement and insight appear normal. Mood & affect appropriate.     Data Reviewed: I have personally reviewed following labs and imaging studies  CBC: Recent Labs  Lab 11/15/21 0510 11/16/21 0127 11/17/21 0302 11/18/21 0033 11/19/21 0205  WBC 6.9 7.3 8.1 8.6 10.3  NEUTROABS 5.0 5.2 5.8 6.5 8.0*  HGB 9.8* 9.1* 9.8* 10.1* 10.2*  HCT 31.1* 28.9* 30.3* 32.0* 32.2*  MCV 87.6 89.2 88.6 88.6 88.5  PLT 144* 156 184 201 053    Basic Metabolic Panel: Recent Labs  Lab 11/15/21 0510 11/16/21 0127 11/17/21 0302 11/18/21 0033 11/19/21 0205  NA 139 138 139 138 136  K 4.1 4.1 4.5 4.1 3.8  CL 108 108 108 105 102  CO2 25 25 26 26 27   GLUCOSE 102* 100* 104* 107* 108*  BUN 18 18 14 12 15   CREATININE 0.81 0.75 0.64 0.70 0.70  CALCIUM 7.9* 7.9* 8.0* 8.0* 8.1*  MG 1.8 1.7 1.6* 2.3 1.7  PHOS 2.5 2.6 2.2* 2.6 3.1    GFR: Estimated Creatinine Clearance: 64.5 mL/min (by C-G formula based on SCr of 0.7 mg/dL). Liver Function  Tests: Recent Labs  Lab 11/15/21 0510 11/16/21 0127 11/17/21 0302 11/18/21 0033 11/19/21 0205  AST 47* 45* 61* 74* 69*  ALT 49* 44 51* 58* 58*  ALKPHOS 68 65 77 83 80  BILITOT 0.6 1.0 1.7* 2.1* 2.1*  PROT 4.3* 4.4* 4.6* 4.8* 4.7*  ALBUMIN 2.0* 2.3* 2.3* 2.4* 2.4*    No results for input(s): LIPASE, AMYLASE in the last 168 hours. No results for input(s): AMMONIA in the last 168 hours. Coagulation Profile: Recent Labs  Lab 11/13/21 2011  INR 1.3*    Cardiac Enzymes: No results for input(s): CKTOTAL, CKMB, CKMBINDEX, TROPONINI in the last 168 hours. BNP (last 3 results) No results for input(s): PROBNP in the last 8760 hours. HbA1C: No results for input(s): HGBA1C in the last 72 hours. CBG: No results for input(s): GLUCAP in the last 168 hours. Lipid Profile: No results for input(s): CHOL, HDL, LDLCALC, TRIG,  CHOLHDL, LDLDIRECT in the last 72 hours. Thyroid Function Tests: No results for input(s): TSH, T4TOTAL, FREET4, T3FREE, THYROIDAB in the last 72 hours.  Anemia Panel: No results for input(s): VITAMINB12, FOLATE, FERRITIN, TIBC, IRON, RETICCTPCT in the last 72 hours.  Sepsis Labs: No results for input(s): PROCALCITON, LATICACIDVEN in the last 168 hours.  Recent Results (from the past 240 hour(s))  Resp Panel by RT-PCR (Flu A&B, Covid) Nasopharyngeal Swab     Status: None   Collection Time: 11/12/21  2:06 PM   Specimen: Nasopharyngeal Swab; Nasopharyngeal(NP) swabs in vial transport medium  Result Value Ref Range Status   SARS Coronavirus 2 by RT PCR NEGATIVE NEGATIVE Final    Comment: (NOTE) SARS-CoV-2 target nucleic acids are NOT DETECTED.  The SARS-CoV-2 RNA is generally detectable in upper respiratory specimens during the acute phase of infection. The lowest concentration of SARS-CoV-2 viral copies this assay can detect is 138 copies/mL. A negative result does not preclude SARS-Cov-2 infection and should not be used as the sole basis for treatment or other patient management decisions. A negative result may occur with  improper specimen collection/handling, submission of specimen other than nasopharyngeal swab, presence of viral mutation(s) within the areas targeted by this assay, and inadequate number of viral copies(<138 copies/mL). A negative result must be combined with clinical observations, patient history, and epidemiological information. The expected result is Negative.  Fact Sheet for Patients:  EntrepreneurPulse.com.au  Fact Sheet for Healthcare Providers:  IncredibleEmployment.be  This test is no t yet approved or cleared by the [place] FDA and  has been authorized for detection and/or diagnosis of SARS-CoV-2 by FDA under an Emergency Use Authorization (EUA). This EUA will remain  in effect (meaning this test can be used)  for the duration of the COVID-19 declaration under Section 564(b)(1) of the Act, 21 U.S.C.section 360bbb-3(b)(1), unless the authorization is terminated  or revoked sooner.       Influenza A by PCR NEGATIVE NEGATIVE Final   Influenza B by PCR NEGATIVE NEGATIVE Final    Comment: (NOTE) The Xpert Xpress SARS-CoV-2/FLU/RSV plus assay is intended as an aid in the diagnosis of influenza from Nasopharyngeal swab specimens and should not be used as a sole basis for treatment. Nasal washings and aspirates are unacceptable for Xpert Xpress SARS-CoV-2/FLU/RSV testing.  Fact Sheet for Patients: EntrepreneurPulse.com.au  Fact Sheet for Healthcare Providers: IncredibleEmployment.be  This test is not yet approved or cleared by the [place] FDA and has been authorized for detection and/or diagnosis of SARS-CoV-2 by FDA under an Emergency Use Authorization (EUA). This EUA will remain  in effect (meaning this test can be used) for the duration of the COVID-19 declaration under Section 564(b)(1) of the Act, 21 U.S.C. section 360bbb-3(b)(1), unless the authorization is terminated or revoked.  Performed at [facility], [place] [address], [place], [place] [address]   Urine Culture     Status: Abnormal   Collection Time: 11/12/21  8:28 PM   Specimen: Urine, Clean Catch  Result Value Ref Range Status   Specimen Description URINE, CLEAN CATCH  Final   Special Requests   Final    NONE Performed at [facility], [place] [address] [place], [place] [address]    Culture (A)  Final    >=100,000 COLONIES/mL ESCHERICHIA COLI Confirmed Extended Spectrum Beta-Lactamase Producer (ESBL).  In bloodstream infections from ESBL organisms, carbapenems are preferred over piperacillin/tazobactam. They are shown to have a lower risk of mortality.    Report Status 11/15/2021 FINAL  Final   Organism ID, Bacteria ESCHERICHIA COLI (A)  Final      Susceptibility    Escherichia coli - MIC*    AMPICILLIN >=32 RESISTANT Resistant     CEFAZOLIN >=64 RESISTANT Resistant     CEFEPIME 16 RESISTANT Resistant     CEFTRIAXONE >=64 RESISTANT Resistant     CIPROFLOXACIN >=4 RESISTANT Resistant     GENTAMICIN <=1 SENSITIVE Sensitive     IMIPENEM <=0.25 SENSITIVE Sensitive     NITROFURANTOIN <=16 SENSITIVE Sensitive     TRIMETH/SULFA <=20 SENSITIVE Sensitive     AMPICILLIN/SULBACTAM 8 SENSITIVE Sensitive     PIP/TAZO <=4 SENSITIVE Sensitive     * >=100,000 COLONIES/mL ESCHERICHIA COLI          Radiology Studies: DG Chest Port 1 View  Result Date: 11/18/2021 CLINICAL DATA:  Short of breath. EXAM: PORTABLE CHEST 1 VIEW COMPARISON:  11/12/2021 and older exams. FINDINGS: Hazy opacity extends from the right mid lung to the right lung base, partly obscuring the hemidiaphragm. There are associated interstitial type opacities most evident in the right mid lung and medial right lung base. Right upper lung is clear. Left lung is clear. No pneumothorax. Cardiac silhouette is normal in size. Stable left anterior chest wall sequential pacemaker and left atrial occlusion device. No mediastinal or hilar masses. Previous thyroid surgery, also stable. Partly imaged reverse right shoulder prosthesis appears well aligned. IMPRESSION: 1. Right mid to lower lung opacity, increased compared to the prior exam, consistent with a combination of a right pleural effusion with atelectasis, infection or a combination. 2. No other evidence of acute cardiopulmonary disease. No pulmonary edema. Electronically Signed   By: Lajean Manes M.D.   On: 11/18/2021 11:27        Scheduled Meds:  amiodarone  200 mg Oral BID   feeding supplement  237 mL Oral TID BM   levothyroxine  125 mcg Oral Q0600   magnesium oxide  400 mg Oral Daily   midodrine  5 mg Oral TID WC   nitrofurantoin (macrocrystal-monohydrate)  100 mg Oral Q12H   polyethylene glycol  17 g Oral Daily   Continuous  Infusions:     LOS: 7 days    Time spent: 44 minutes    Georgette Shell, MD2/27/2023, 2:37 PM

## 2021-11-19 NOTE — Plan of Care (Signed)

## 2021-11-19 NOTE — Progress Notes (Signed)
Mobility Specialist: Progress Note   11/19/21 1248  Mobility  Bed Position Chair  Activity Transferred from bed to chair;Transferred to/from [facility]  Level of Assistance Moderate assist, patient does 50-74%  Assistive Device Front wheel walker  Distance Ambulated (ft) 3 ft  Activity Response Tolerated fair  $Mobility charge 1 Mobility   Pt declined ambulation but assisted to BSC, BM successful. Pt with short standing tolerance today d/t fatigue with c/o feeling like she was going to pass out on one occasion. Pt assisted to the recliner after pericare with help from NT, call bell in her lap. Will f/u as needed to assist pt back to bed.   [facility] Eulice Rutledge Mobility Specialist Mobility Specialist 5 North: [phone] Mobility Specialist 6 North: [phone]

## 2021-11-20 DIAGNOSIS — F0393 Unspecified dementia, unspecified severity, with mood disturbance: Secondary | ICD-10-CM | POA: Diagnosis not present

## 2021-11-20 DIAGNOSIS — R1314 Dysphagia, pharyngoesophageal phase: Secondary | ICD-10-CM | POA: Diagnosis not present

## 2021-11-20 DIAGNOSIS — I509 Heart failure, unspecified: Secondary | ICD-10-CM | POA: Diagnosis not present

## 2021-11-20 DIAGNOSIS — Z9181 History of falling: Secondary | ICD-10-CM | POA: Diagnosis not present

## 2021-11-20 DIAGNOSIS — C8590 Non-Hodgkin lymphoma, unspecified, unspecified site: Secondary | ICD-10-CM | POA: Diagnosis not present

## 2021-11-20 DIAGNOSIS — S300XXA Contusion of lower back and pelvis, initial encounter: Secondary | ICD-10-CM | POA: Diagnosis not present

## 2021-11-20 DIAGNOSIS — I4819 Other persistent atrial fibrillation: Secondary | ICD-10-CM | POA: Diagnosis not present

## 2021-11-20 DIAGNOSIS — R197 Diarrhea, unspecified: Secondary | ICD-10-CM | POA: Diagnosis not present

## 2021-11-20 DIAGNOSIS — F5102 Adjustment insomnia: Secondary | ICD-10-CM | POA: Diagnosis not present

## 2021-11-20 DIAGNOSIS — Z7401 Bed confinement status: Secondary | ICD-10-CM | POA: Diagnosis not present

## 2021-11-20 DIAGNOSIS — E039 Hypothyroidism, unspecified: Secondary | ICD-10-CM | POA: Diagnosis not present

## 2021-11-20 DIAGNOSIS — I4891 Unspecified atrial fibrillation: Secondary | ICD-10-CM | POA: Diagnosis not present

## 2021-11-20 DIAGNOSIS — Z66 Do not resuscitate: Secondary | ICD-10-CM | POA: Diagnosis not present

## 2021-11-20 DIAGNOSIS — I480 Paroxysmal atrial fibrillation: Secondary | ICD-10-CM | POA: Diagnosis not present

## 2021-11-20 DIAGNOSIS — E876 Hypokalemia: Secondary | ICD-10-CM | POA: Diagnosis not present

## 2021-11-20 DIAGNOSIS — M6259 Muscle wasting and atrophy, not elsewhere classified, multiple sites: Secondary | ICD-10-CM | POA: Diagnosis not present

## 2021-11-20 DIAGNOSIS — D649 Anemia, unspecified: Secondary | ICD-10-CM | POA: Diagnosis not present

## 2021-11-20 DIAGNOSIS — Z6830 Body mass index (BMI) 30.0-30.9, adult: Secondary | ICD-10-CM | POA: Diagnosis not present

## 2021-11-20 DIAGNOSIS — N1831 Chronic kidney disease, stage 3a: Secondary | ICD-10-CM | POA: Diagnosis not present

## 2021-11-20 DIAGNOSIS — I484 Atypical atrial flutter: Secondary | ICD-10-CM | POA: Diagnosis not present

## 2021-11-20 DIAGNOSIS — N179 Acute kidney failure, unspecified: Secondary | ICD-10-CM | POA: Diagnosis not present

## 2021-11-20 DIAGNOSIS — R0600 Dyspnea, unspecified: Secondary | ICD-10-CM | POA: Diagnosis not present

## 2021-11-20 DIAGNOSIS — I119 Hypertensive heart disease without heart failure: Secondary | ICD-10-CM | POA: Diagnosis not present

## 2021-11-20 DIAGNOSIS — N390 Urinary tract infection, site not specified: Secondary | ICD-10-CM | POA: Diagnosis not present

## 2021-11-20 DIAGNOSIS — E871 Hypo-osmolality and hyponatremia: Secondary | ICD-10-CM | POA: Diagnosis not present

## 2021-11-20 DIAGNOSIS — I10 Essential (primary) hypertension: Secondary | ICD-10-CM | POA: Diagnosis not present

## 2021-11-20 DIAGNOSIS — I5033 Acute on chronic diastolic (congestive) heart failure: Secondary | ICD-10-CM | POA: Diagnosis not present

## 2021-11-20 DIAGNOSIS — E890 Postprocedural hypothyroidism: Secondary | ICD-10-CM | POA: Diagnosis not present

## 2021-11-20 DIAGNOSIS — I482 Chronic atrial fibrillation, unspecified: Secondary | ICD-10-CM | POA: Diagnosis not present

## 2021-11-20 DIAGNOSIS — M25551 Pain in right hip: Secondary | ICD-10-CM | POA: Diagnosis not present

## 2021-11-20 DIAGNOSIS — R41841 Cognitive communication deficit: Secondary | ICD-10-CM | POA: Diagnosis not present

## 2021-11-20 DIAGNOSIS — I5032 Chronic diastolic (congestive) heart failure: Secondary | ICD-10-CM | POA: Diagnosis not present

## 2021-11-20 DIAGNOSIS — I442 Atrioventricular block, complete: Secondary | ICD-10-CM | POA: Diagnosis not present

## 2021-11-20 DIAGNOSIS — I890 Lymphedema, not elsewhere classified: Secondary | ICD-10-CM | POA: Diagnosis not present

## 2021-11-20 DIAGNOSIS — B9620 Unspecified Escherichia coli [E. coli] as the cause of diseases classified elsewhere: Secondary | ICD-10-CM | POA: Diagnosis not present

## 2021-11-20 DIAGNOSIS — R4182 Altered mental status, unspecified: Secondary | ICD-10-CM | POA: Diagnosis not present

## 2021-11-20 DIAGNOSIS — I951 Orthostatic hypotension: Secondary | ICD-10-CM | POA: Diagnosis not present

## 2021-11-20 DIAGNOSIS — R2681 Unsteadiness on feet: Secondary | ICD-10-CM | POA: Diagnosis not present

## 2021-11-20 DIAGNOSIS — R627 Adult failure to thrive: Secondary | ICD-10-CM | POA: Diagnosis not present

## 2021-11-20 DIAGNOSIS — I5031 Acute diastolic (congestive) heart failure: Secondary | ICD-10-CM | POA: Diagnosis not present

## 2021-11-20 DIAGNOSIS — N3080 Other cystitis without hematuria: Secondary | ICD-10-CM | POA: Diagnosis not present

## 2021-11-20 DIAGNOSIS — E861 Hypovolemia: Secondary | ICD-10-CM | POA: Diagnosis not present

## 2021-11-20 DIAGNOSIS — R932 Abnormal findings on diagnostic imaging of liver and biliary tract: Secondary | ICD-10-CM | POA: Diagnosis not present

## 2021-11-20 DIAGNOSIS — M79651 Pain in right thigh: Secondary | ICD-10-CM | POA: Diagnosis not present

## 2021-11-20 DIAGNOSIS — I340 Nonrheumatic mitral (valve) insufficiency: Secondary | ICD-10-CM | POA: Diagnosis not present

## 2021-11-20 DIAGNOSIS — Z1612 Extended spectrum beta lactamase (ESBL) resistance: Secondary | ICD-10-CM | POA: Diagnosis not present

## 2021-11-20 DIAGNOSIS — Z20822 Contact with and (suspected) exposure to covid-19: Secondary | ICD-10-CM | POA: Diagnosis not present

## 2021-11-20 DIAGNOSIS — R262 Difficulty in walking, not elsewhere classified: Secondary | ICD-10-CM | POA: Diagnosis not present

## 2021-11-20 DIAGNOSIS — M6281 Muscle weakness (generalized): Secondary | ICD-10-CM | POA: Diagnosis not present

## 2021-11-20 DIAGNOSIS — E785 Hyperlipidemia, unspecified: Secondary | ICD-10-CM | POA: Diagnosis not present

## 2021-11-20 DIAGNOSIS — R5381 Other malaise: Secondary | ICD-10-CM | POA: Diagnosis not present

## 2021-11-20 DIAGNOSIS — L539 Erythematous condition, unspecified: Secondary | ICD-10-CM | POA: Diagnosis not present

## 2021-11-20 DIAGNOSIS — Z950 Presence of cardiac pacemaker: Secondary | ICD-10-CM | POA: Diagnosis not present

## 2021-11-20 DIAGNOSIS — Z23 Encounter for immunization: Secondary | ICD-10-CM | POA: Diagnosis not present

## 2021-11-20 DIAGNOSIS — E860 Dehydration: Secondary | ICD-10-CM | POA: Diagnosis not present

## 2021-11-20 DIAGNOSIS — R609 Edema, unspecified: Secondary | ICD-10-CM | POA: Diagnosis not present

## 2021-11-20 DIAGNOSIS — A4150 Gram-negative sepsis, unspecified: Secondary | ICD-10-CM | POA: Diagnosis not present

## 2021-11-20 DIAGNOSIS — E46 Unspecified protein-calorie malnutrition: Secondary | ICD-10-CM | POA: Diagnosis not present

## 2021-11-20 DIAGNOSIS — L03113 Cellulitis of right upper limb: Secondary | ICD-10-CM | POA: Diagnosis not present

## 2021-11-20 DIAGNOSIS — I110 Hypertensive heart disease with heart failure: Secondary | ICD-10-CM | POA: Diagnosis not present

## 2021-11-20 DIAGNOSIS — Z8673 Personal history of transient ischemic attack (TIA), and cerebral infarction without residual deficits: Secondary | ICD-10-CM | POA: Diagnosis not present

## 2021-11-20 DIAGNOSIS — R2689 Other abnormalities of gait and mobility: Secondary | ICD-10-CM | POA: Diagnosis not present

## 2021-11-20 DIAGNOSIS — R296 Repeated falls: Secondary | ICD-10-CM | POA: Diagnosis not present

## 2021-11-20 DIAGNOSIS — R531 Weakness: Secondary | ICD-10-CM | POA: Diagnosis not present

## 2021-11-20 DIAGNOSIS — N3000 Acute cystitis without hematuria: Secondary | ICD-10-CM | POA: Diagnosis not present

## 2021-11-20 DIAGNOSIS — R5383 Other fatigue: Secondary | ICD-10-CM | POA: Diagnosis not present

## 2021-11-20 DIAGNOSIS — R000 Tachycardia, unspecified: Secondary | ICD-10-CM | POA: Diagnosis not present

## 2021-11-20 DIAGNOSIS — R059 Cough, unspecified: Secondary | ICD-10-CM | POA: Diagnosis not present

## 2021-11-20 DIAGNOSIS — F4323 Adjustment disorder with mixed anxiety and depressed mood: Secondary | ICD-10-CM | POA: Diagnosis not present

## 2021-11-20 DIAGNOSIS — W19XXXA Unspecified fall, initial encounter: Secondary | ICD-10-CM | POA: Diagnosis not present

## 2021-11-20 DIAGNOSIS — S300XXD Contusion of lower back and pelvis, subsequent encounter: Secondary | ICD-10-CM | POA: Diagnosis not present

## 2021-11-20 DIAGNOSIS — I700 Atherosclerosis of aorta: Secondary | ICD-10-CM | POA: Diagnosis not present

## 2021-11-20 DIAGNOSIS — Z9189 Other specified personal risk factors, not elsewhere classified: Secondary | ICD-10-CM | POA: Diagnosis not present

## 2021-11-20 DIAGNOSIS — R7401 Elevation of levels of liver transaminase levels: Secondary | ICD-10-CM | POA: Diagnosis not present

## 2021-11-20 LAB — CBC WITH DIFFERENTIAL/PLATELET
Abs Immature Granulocytes: 0.14 10*3/uL — ABNORMAL HIGH (ref 0.00–0.07)
Basophils Absolute: 0.1 10*3/uL (ref 0.0–0.1)
Basophils Relative: 1 %
Eosinophils Absolute: 0.2 10*3/uL (ref 0.0–0.5)
Eosinophils Relative: 2 %
HCT: 32.7 % — ABNORMAL LOW (ref 36.0–46.0)
Hemoglobin: 10.7 g/dL — ABNORMAL LOW (ref 12.0–15.0)
Immature Granulocytes: 2 %
Lymphocytes Relative: 9 %
Lymphs Abs: 0.9 10*3/uL (ref 0.7–4.0)
MCH: 29.1 pg (ref 26.0–34.0)
MCHC: 32.7 g/dL (ref 30.0–36.0)
MCV: 88.9 fL (ref 80.0–100.0)
Monocytes Absolute: 1.3 10*3/uL — ABNORMAL HIGH (ref 0.1–1.0)
Monocytes Relative: 14 %
Neutro Abs: 6.9 10*3/uL (ref 1.7–7.7)
Neutrophils Relative %: 72 %
Platelets: 239 10*3/uL (ref 150–400)
RBC: 3.68 MIL/uL — ABNORMAL LOW (ref 3.87–5.11)
RDW: 18.8 % — ABNORMAL HIGH (ref 11.5–15.5)
WBC: 9.5 10*3/uL (ref 4.0–10.5)
nRBC: 0 % (ref 0.0–0.2)

## 2021-11-20 LAB — PHOSPHORUS: Phosphorus: 3.2 mg/dL (ref 2.5–4.6)

## 2021-11-20 LAB — MAGNESIUM: Magnesium: 1.7 mg/dL (ref 1.7–2.4)

## 2021-11-20 MED ORDER — LEVOTHYROXINE SODIUM 125 MCG PO TABS: 125.0000 ug | ORAL_TABLET | Freq: Every day | ORAL | Status: DC

## 2021-11-20 MED ORDER — NITROFURANTOIN MONOHYD MACRO 100 MG PO CAPS: 100.0000 mg | ORAL_CAPSULE | Freq: Two times a day (BID) | ORAL | 0 refills | Status: DC

## 2021-11-20 MED ORDER — POLYETHYLENE GLYCOL 3350 17 G PO PACK: 17.0000 g | PACK | Freq: Every day | ORAL | 0 refills | Status: DC

## 2021-11-20 MED ORDER — MAGNESIUM OXIDE -MG SUPPLEMENT 400 (240 MG) MG PO TABS: 400.0000 mg | ORAL_TABLET | Freq: Every day | ORAL | Status: DC

## 2021-11-20 NOTE — TOC Transition Note (Signed)
Transition of Care [facility]) - CM/SW Discharge Note   Patient Details  Name: JULIANA BOLING MRN: [id number] Date of Birth: 06/11/1946  [facility] CM/SW Contact:  Emeterio Reeve, LCSW Phone Number: 11/20/2021, 11:47 AM   Clinical Narrative:     Per MD patient ready for DC to [facility]. RN, patient, patient's family, and facility notified of DC. Discharge Summary and FL2 sent to facility. DC packet on chart. Insurance Josem Kaufmann has been received and pt is covid negative. Ambulance transport requested for patient.    RN to call report to [phone].  CSW will sign off for now as social work intervention is no longer needed. Please consult [place] again if new needs arise.   Final next level of care: Skilled Nursing Facility Barriers to Discharge: Barriers Resolved   Patient Goals and CMS Choice   CMS Medicare.gov Compare Post Acute Care list provided to:: Patient Choice offered to / list presented to : Patient  Discharge Placement              Patient chooses bed at: [facility] Patient to be transferred to facility by: ptR Name of family member notified: pt is x4 and is aware of DC Patient and family notified of of transfer: 11/20/21  Discharge Plan and Services                                     Social Determinants of Health (SDOH) Interventions     Readmission Risk Interventions No flowsheet data found.  Emeterio Reeve, LCSW Clinical Social Worker

## 2021-11-20 NOTE — Plan of Care (Signed)

## 2021-11-20 NOTE — Discharge Summary (Signed)
Physician Discharge Summary  Kayla Harrison:[id number] DOB: 1945-09-25 DOA: 11/12/2021  PCP: [name], MD  Admit date: 11/12/2021 Discharge date: 11/20/2021  Admitted From: Home Disposition: Skilled nursing facility  Recommendations for Outpatient Follow-up:  Follow up with PCP in 1-2 weeks Please obtain CMP/CBC in one week Please follow up cardiologist Dr. Caryl Comes for possible DC cardioversion once back on Eliquis uninterrupted for at least a month.  Home Health: None Equipment/Devices none  Discharge Condition: Stable CODE STATUS: Full code Diet recommendation: Cardiac  Brief/Interim Summary: 76 year old female lives at home alone with multiple falls.  She has history of atrial fibrillation heart block status post permanent pacemaker, chronic hypotension on midodrine Chest x-ray not suggestive of pneumonia, rib fractures, or pneumothorax.     Pelvic x-ray negative for acute finding.     CT head and C-spine negative for acute finding; showing small right-sided effusion in the upper right chest at the apex measures water density but is incompletely evaluated.  Previously noted to have a right-sided effusion in June 2022 that did not track to the level of the apex.   CT renal stone study showing marked volume of air within the bladder, including throughout the bladder walls, compatible with a severe emphysematous cystitis.  Showing a large right gluteal hematoma measuring approximately 15 x 9 x 11 cm.  Showing a small to moderate size right pleural effusion, incompletely imaged.  Hyperdense liver suspicious for hemochromatosis.   CT with additional views done: "No recent fracture or dislocation is seen in the right hip. There are large hematomas in the right gluteal muscles and in the subcutaneous plane in the right gluteal region extending to the posterior upper right thigh. There is 9.9 x 7 cm hematoma in the right gluteus maximus. There is 10.2 x 5.6 cm subcutaneous  hematoma posterior to the gluteal muscles. In addition, there is extensive subcutaneous edema posterior to the gluteal and subcutaneous hematomas suggesting contusion extending from the right gluteal region and along the posterior right upper thigh."   Discharge Diagnoses:  Principal Problem:   Orthostatic hypotension Active Problems:   HYPERLIPIDEMIA   Pleural effusion   Hypothyroidism   Persistent atrial fibrillation ([place])   Fall   Traumatic hematoma of buttock   Emphysematous cystitis   Abnormal finding on imaging of liver   History of fall   History of hypotension   Pressure ulcer with suspected deep tissue injury, stage II (HCC)   Prolonged QT interval   #1 orthostatic hypotension with syncope-her blood pressure was 58/32 with EMS.  Patient has had multiple falls at home prior to admission.  Pacemaker interrogated and shows no signs of arrhythmia. Blood pressure improved to 139/84, with IV fluids. She received albumin x2. Cortisol 16.6   Echocardiogram -ejection fraction 55 to 60%.  No regional wall motion abnormalities.  No LVH. TSH 0.049 Synthroid dose adjusted.  Recheck TSH in 6 weeks.   Continue midodrine 5 mg 2 times a day.   #2 chronic persistent atrial fibrillation with RVR -rate controlled on amiodarone.  Restarted Eliquis 11/19/2021 since hemoglobin had been stable.    #3 anemia due to traumatic hematoma to the right buttocks status post 2 units of packed RBC, hemoglobin stable at 10.1   #4 emphysematous cystitis with patient complaining of dysuria -urine culture with ESBL E. coli.  Started Macrobid 11/15/2021 continue for 2 more days on discharge.   #5 hypothyroidism on Synthroid TSH is 0.049 Synthroid dose adjusted.  Recheck TSH in 6 weeks.   #  6 hyperlipidemia holding statin due to elevated LFTs   #7 hypomagnesemia repleted   #8?  Hemochromatosis of the liver CT with findings suspicious for hemochromatosis of the liver.  Will need outpatient follow-up.   Follow-up with PCP. Iron 51, TIBC 242, ferritin 86, transferrin 173.   #9 multiple falls lives alone PT recommends SNF.    #10 right buttock bruises/hematoma status post fall at home Eliquis contributing to hematoma improving.   #11 erythema to the right f heel keep legs elevated and floating   #12 elevated LFTs statins were on hold during the hospital stay this was restarted on discharge.  Please follow-up on LFTs as an outpatient.    #13 constipation continue MiraLAX   #14 shortness of breath patient was complaining of short of breath which is new this morning.  Chest x-ray shows right lower effusion versus atelectasis, pneumonia could not be ruled out. encourage her to use incentive spirometry aggressively.   Nutrition Problem: Inadequate oral intake Etiology: poor appetite    Nutrition Problem: Inadequate oral intake Etiology: poor appetite    Signs/Symptoms: per patient/family report     Interventions: Refer to RD note for recommendations  Estimated body mass index is 30.07 kg/m as calculated from the following:   Height as of this encounter: 5\' 6"  (1.676 m).   Weight as of this encounter: 84.5 kg.  Discharge Instructions  Discharge Instructions     Diet - low sodium heart healthy   Complete by: As directed    Increase activity slowly   Complete by: As directed       Allergies as of 11/20/2021       Reactions   Ranolazine    Balance issues   Rivaroxaban    Nose Bleed X 6 hrs ; packing in ER Nasal hemorrhage   Tape Rash, Other (See Comments)   SKIN IS THIN AND TEARS EASILY (also does not tolerate paper tape well)     Tikosyn [dofetilide] Other (See Comments)   "Long QT wave"   Epinephrine Other (See Comments)   Oral anesthetic Dental form only (liquid). Patient stated she will become "out of it" she can hear you but cannot respond in normal fashion. "not fully with it"   Ketamine Other (See Comments)   Hallucinations        Medication List      TAKE these medications    acetaminophen 325 MG tablet Commonly known as: TYLENOL Take 2 tablets (650 mg total) by mouth every 4 (four) hours as needed for mild pain (or temp > 37.5 C (99.5 F)).   amiodarone 200 MG tablet Commonly known as: PACERONE Take 1 tablet (200 mg total) by mouth in the morning and at bedtime.   CALCIUM + VITAMIN D3 PO Take 1 tablet by mouth daily.   Eliquis 5 MG Tabs tablet Generic drug: apixaban TAKE 1 TABLET BY MOUTH  TWICE DAILY   levothyroxine 125 MCG tablet Commonly known as: SYNTHROID Take 1 tablet (125 mcg total) by mouth daily at 6 (six) AM. Start taking on: November 21, 2021 What changed:  medication strength how much to take when to take this   magnesium oxide 400 (240 Mg) MG tablet Commonly known as: MAG-OX Take 1 tablet (400 mg total) by mouth daily. Start taking on: November 21, 2021   midodrine 5 MG tablet Commonly known as: PROAMATINE TAKE 1 TABLET BY MOUTH TWICE DAILY WITH A MEAL   nitrofurantoin (macrocrystal-monohydrate) 100 MG capsule Commonly known as: MACROBID Take  1 capsule (100 mg total) by mouth every 12 (twelve) hours.   polyethylene glycol 17 g packet Commonly known as: MIRALAX / GLYCOLAX Take 17 g by mouth daily.   Restasis 0.05 % ophthalmic emulsion Generic drug: cycloSPORINE Place 1 drop into both eyes daily.   rosuvastatin 10 MG tablet Commonly known as: CRESTOR Take 1 tablet (10 mg total) by mouth daily. Mondays & Thursdays What changed: when to take this        [place], MD Follow up.   Specialty: Endocrinology Contact information: [place] [address] [phone]         Deboraha Sprang, MD .   Specialty: Cardiology Contact information: [phone] [place] [address] [phone]                Allergies  Allergen Reactions   Ranolazine     Balance issues   Rivaroxaban     Nose Bleed X 6 hrs ; packing in  ER Nasal hemorrhage   Tape Rash and Other (See Comments)    SKIN IS THIN AND TEARS EASILY (also does not tolerate paper tape well)     Tikosyn [Dofetilide] Other (See Comments)    "Long QT wave"    Epinephrine Other (See Comments)    Oral anesthetic Dental form only (liquid). Patient stated she will become "out of it" she can hear you but cannot respond in normal fashion. "not fully with it"   Ketamine Other (See Comments)    Hallucinations     Consultations: Cardiology   Procedures/Studies: CT Head Wo Contrast  Result Date: 11/12/2021 CLINICAL DATA:  Head trauma, moderate-severe EXAM: CT HEAD WITHOUT CONTRAST TECHNIQUE: Contiguous axial images were obtained from the base of the skull through the vertex without intravenous contrast. RADIATION DOSE REDUCTION: This exam was performed according to the departmental dose-optimization program which includes automated exposure control, adjustment of the mA and/or kV according to patient size and/or use of iterative reconstruction technique. COMPARISON:  03/15/2021 FINDINGS: Brain: No evidence of acute infarction, hemorrhage, hydrocephalus, extra-axial collection or mass lesion/mass effect. Small focus of encephalomalacia within the left caudate head at site of previously seen hemorrhage. Moderate-advanced low-density changes within the periventricular and subcortical white matter compatible with chronic microvascular ischemic change. Mild diffuse cerebral volume loss. Vascular: Atherosclerotic calcifications involving the large vessels of the skull base. No unexpected hyperdense vessel. Skull: Normal. Negative for fracture or focal lesion. Sinuses/Orbits: No acute finding. Other: None. IMPRESSION: 1. No acute intracranial abnormality. 2. Chronic microvascular ischemic disease. Electronically Signed   By: Davina Poke D.O.   On: 11/12/2021 16:25   CT CHEST WO CONTRAST  Result Date: 11/14/2021 CLINICAL DATA:  76 year old female with history of  pleural effusion. EXAM: CT CHEST WITHOUT CONTRAST TECHNIQUE: Multidetector CT imaging of the chest was performed following the standard protocol without IV contrast. RADIATION DOSE REDUCTION: This exam was performed according to the departmental dose-optimization program which includes automated exposure control, adjustment of the mA and/or kV according to patient size and/or use of iterative reconstruction technique. COMPARISON:  Chest CT 03/14/2021. FINDINGS: Cardiovascular: Heart size is normal. There is no significant pericardial fluid, thickening or pericardial calcification. There is aortic atherosclerosis, as well as atherosclerosis of the great vessels of the mediastinum and the coronary arteries, including calcified atherosclerotic plaque in the left main, left anterior descending, left circumflex and right coronary arteries. Severe calcifications of the mitral annulus. Left-sided pacemaker/AICD with lead  tips terminating in the right atrium and right ventricular apex. Mediastinum/Nodes: No pathologically enlarged mediastinal or hilar lymph nodes. Esophagus is unremarkable in appearance. No axillary lymphadenopathy. Lungs/Pleura: Moderate right pleural effusion increased slightly compared to the prior study. Trace left pleural effusion. Dependent opacities in the lungs bilaterally, indicative of areas of passive subsegmental atelectasis. There also some patchy areas of ground-glass attenuation, septal thickening, thickening of the peribronchovascular interstitium, mild cylindrical bronchiectasis and regional architectural distortion most evident in the medial aspect of the right lung and medial aspect of the left lower lobe, similar to prior studies, presumably areas of chronic post infectious or inflammatory scarring. No acute consolidative airspace disease. No definite suspicious appearing pulmonary nodules or masses are noted. Upper Abdomen: LapBand in position. Calcified granulomas in the spleen.  Low-attenuation lesion in segment 4A of the liver measuring 1.2 cm in diameter, incompletely characterized on today's non-contrast CT examination, but statistically likely to represent a cyst. High attenuation throughout the visualized hepatic parenchyma, concerning for hepatic iron deposition. Nonobstructive calculi in the left renal collecting system measuring up to 6 mm in the interpolar region. Musculoskeletal: Status post right shoulder arthroplasty. There are no aggressive appearing lytic or blastic lesions noted in the visualized portions of the skeleton. IMPRESSION: 1. Moderate right pleural effusion increased slightly compared to the prior study. 2. Trace left pleural effusion lying dependently. 3. Areas of post infectious or inflammatory scarring in the lungs bilaterally, similar to the prior study. Additional areas of passive subsegmental atelectasis are noted dependently in the lower lobes of the lungs. 4. Aortic atherosclerosis, in addition to left main and three-vessel coronary artery disease. Assessment for potential risk factor modification, dietary therapy or pharmacologic therapy may be warranted, if clinically indicated. 5. There are calcifications of the mitral annulus. Echocardiographic correlation for evaluation of potential valvular dysfunction may be warranted if clinically indicated. 6. Numerous nonobstructive calculi in the collecting system of the left kidney measuring up to 6 mm in the interpolar region. Aortic Atherosclerosis (ICD10-I70.0). Electronically Signed   By: Vinnie Langton M.D.   On: 11/14/2021 08:15   CT Cervical Spine Wo Contrast  Result Date: 11/12/2021 CLINICAL DATA:  Neck trauma in a 76 year old female. EXAM: CT CERVICAL SPINE WITHOUT CONTRAST TECHNIQUE: Multidetector CT imaging of the cervical spine was performed without intravenous contrast. Multiplanar CT image reconstructions were also generated. RADIATION DOSE REDUCTION: This exam was performed according to the  departmental dose-optimization program which includes automated exposure control, adjustment of the mA and/or kV according to patient size and/or use of iterative reconstruction technique. COMPARISON:  December 15, 2020. FINDINGS: Alignment: Normal. Skull base and vertebrae: No acute fracture. No primary bone lesion or focal pathologic process. Soft tissues and spinal canal: No prevertebral fluid or swelling. No visible canal hematoma. Disc levels: Multilevel degenerative changes with small to moderate anterior osteophytes greatest at C5-6. Moderate to marked uncovertebral spurring with some neural foraminal narrowing at C5-6 and C6-7 greatest on the RIGHT. Upper chest: Small RIGHT-sided effusion in the upper RIGHT chest measures water density but is incompletely evaluated. Incidental imaging of upper chest is otherwise unremarkable. Other: None IMPRESSION: 1. No acute fracture or static subluxation of the cervical spine. 2. Multilevel degenerative changes with some neural foraminal narrowing at C5-6 and C6-7 greatest on the RIGHT. 3. Small RIGHT-sided effusion in the upper RIGHT chest at the apex measures water density but is incompletely evaluated. Previously noted to have a RIGHT-sided effusion in June of 2022 that did not track to the level  of the apex. Electronically Signed   By: Zetta Bills M.D.   On: 11/12/2021 16:35   DG Pelvis Portable  Result Date: 11/12/2021 CLINICAL DATA:  Level 2 trauma, fall, pain EXAM: PORTABLE PELVIS 1-2 VIEWS COMPARISON:  12/15/2020 FINDINGS: Bones are osteopenic. Similar degenerative changes of the pelvis and both hips. No malalignment or acute osseous finding. Bony pelvis and hips appear symmetric and intact. Gastric banding hardware noted. Aortoiliac and femoral atherosclerosis present. IMPRESSION: No acute osseous finding. Electronically Signed   By: Jerilynn Mages.  Shick M.D.   On: 11/12/2021 14:26   DG Chest Port 1 View  Result Date: 11/18/2021 CLINICAL DATA:  Short of breath.  EXAM: PORTABLE CHEST 1 VIEW COMPARISON:  11/12/2021 and older exams. FINDINGS: Hazy opacity extends from the right mid lung to the right lung base, partly obscuring the hemidiaphragm. There are associated interstitial type opacities most evident in the right mid lung and medial right lung base. Right upper lung is clear. Left lung is clear. No pneumothorax. Cardiac silhouette is normal in size. Stable left anterior chest wall sequential pacemaker and left atrial occlusion device. No mediastinal or hilar masses. Previous thyroid surgery, also stable. Partly imaged reverse right shoulder prosthesis appears well aligned. IMPRESSION: 1. Right mid to lower lung opacity, increased compared to the prior exam, consistent with a combination of a right pleural effusion with atelectasis, infection or a combination. 2. No other evidence of acute cardiopulmonary disease. No pulmonary edema. Electronically Signed   By: Lajean Manes M.D.   On: 11/18/2021 11:27   DG Chest Portable 1 View  Result Date: 11/12/2021 CLINICAL DATA:  Level 2 trauma, fall EXAM: PORTABLE CHEST 1 VIEW COMPARISON:  12/15/2020 FINDINGS: Stable postoperative changes including left subclavian 2 lead pacer, left atrial appendage clip, and right shoulder reverse arthroplasty. Stable heart size and vascularity. Minor right base atelectasis or scarring. No focal pneumonia, collapse or consolidation. No large effusion or pneumothorax. Trachea midline. Aorta atherosclerotic. IMPRESSION: Stable chronic postoperative findings. Right basilar atelectasis versus scarring. Aortic Atherosclerosis (ICD10-I70.0). Electronically Signed   By: Jerilynn Mages.  Shick M.D.   On: 11/12/2021 14:24   ECHOCARDIOGRAM COMPLETE  Result Date: 11/13/2021    ECHOCARDIOGRAM REPORT   Patient Name:   SHAROLYN WEBER Date of Exam: 11/13/2021 Medical Rec #:  [id number]           Height:       66.0 in Accession #:    [id number]          Weight:       200.0 lb Date of Birth:  1946/02/04             BSA:          2.000 m Patient Age:    76 years            BP:           81/59 mmHg Patient Gender: F                   HR:           128 bpm. Exam Location:  Inpatient Procedure: 2D Echo, Cardiac Doppler and Color Doppler Indications:    Syncope  History:        Patient has prior history of Echocardiogram examinations, most                 recent 10/04/2021. Stroke, Arrythmias:Atrial Fibrillation; Risk  Factors:Dyslipidemia and Hypertension. 03/09/2021 cardioversion                 07/21/2019 cath.  Sonographer:    Luisa Hart RDCS Referring Phys: [id number] [place]  1. Left ventricular ejection fraction, by estimation, is 55 to 60%. The left ventricle has normal function. The left ventricle has no regional wall motion abnormalities. Indeterminate diastolic filling due to E-A fusion. There is the interventricular septum is flattened in diastole ('D' shaped left ventricle), consistent with right ventricular volume overload.  2. Right ventricular systolic function is mildly reduced. The right ventricular size is mildly enlarged. There is mildly elevated pulmonary artery systolic pressure.  3. Left atrial size was moderately dilated.  4. The mitral valve is degenerative. Moderate mitral valve regurgitation. Mild mitral stenosis. The mean mitral valve gradient is 5.0 mmHg with average heart rate of 122 bpm. Severe mitral annular calcification.  5. Tricuspid valve regurgitation is severe.  6. The aortic valve is normal in structure. There is mild calcification of the aortic valve. There is mild thickening of the aortic valve. Aortic valve regurgitation is not visualized. Aortic valve sclerosis is present, with no evidence of aortic valve stenosis.  7. The inferior vena cava is dilated in size with >50% respiratory variability, suggesting right atrial pressure of 8 mmHg. Comparison(s): Prior images reviewed side by side. Mitral insufficiency appears to be less severe. FINDINGS  Left  Ventricle: Left ventricular ejection fraction, by estimation, is 55 to 60%. The left ventricle has normal function. The left ventricle has no regional wall motion abnormalities. The left ventricular internal cavity size was normal in size. There is  no left ventricular hypertrophy. The interventricular septum is flattened in diastole ('D' shaped left ventricle), consistent with right ventricular volume overload. Indeterminate diastolic filling due to E-A fusion. Right Ventricle: The right ventricular size is mildly enlarged. No increase in right ventricular wall thickness. Right ventricular systolic function is mildly reduced. There is mildly elevated pulmonary artery systolic pressure. The tricuspid regurgitant  velocity is 2.92 m/s, and with an assumed right atrial pressure of 8 mmHg, the estimated right ventricular systolic pressure is 82.4 mmHg. Left Atrium: Left atrial size was moderately dilated. Right Atrium: Right atrial size was normal in size. Pericardium: There is no evidence of pericardial effusion. Mitral Valve: The mitral valve is degenerative in appearance. Severe mitral annular calcification. Moderate mitral valve regurgitation, with centrally-directed jet. Mild mitral valve stenosis. MV peak gradient, 8.9 mmHg. The mean mitral valve gradient is  5.0 mmHg with average heart rate of 122 bpm. Tricuspid Valve: The tricuspid valve is grossly normal. Tricuspid valve regurgitation is severe. Aortic Valve: The aortic valve is normal in structure. There is mild calcification of the aortic valve. There is mild thickening of the aortic valve. Aortic valve regurgitation is not visualized. Aortic valve sclerosis is present, with no evidence of aortic valve stenosis. Aortic valve mean gradient measures 5.2 mmHg. Aortic valve peak gradient measures 10.2 mmHg. Aortic valve area, by VTI measures 1.64 cm. Pulmonic Valve: The pulmonic valve was grossly normal. Pulmonic valve regurgitation is not visualized. Aorta:  The aortic root is normal in size and structure. Venous: The inferior vena cava is dilated in size with greater than 50% respiratory variability, suggesting right atrial pressure of 8 mmHg. The inferior vena cava and the hepatic vein show a pattern of systolic flow reversal, suggestive of tricuspid regurgitation. IAS/Shunts: There is redundancy of the interatrial septum. No atrial level shunt detected by color flow Doppler.  Additional Comments: A device lead is visualized in the right ventricle.  LEFT VENTRICLE PLAX 2D LVIDd:         4.60 cm LVIDs:         3.20 cm LV PW:         1.00 cm LV IVS:        0.90 cm LVOT diam:     1.70 cm LV SV:         30 LV SV Index:   15 LVOT Area:     2.27 cm  RIGHT VENTRICLE RV Basal diam:  3.50 cm RV Mid diam:    1.60 cm LEFT ATRIUM              Index        RIGHT ATRIUM           Index LA diam:        4.30 cm  2.15 cm/m   RA Area:     14.10 cm LA Vol (A2C):   68.2 ml  34.10 ml/m  RA Volume:   29.50 ml  14.75 ml/m LA Vol (A4C):   107.0 ml 53.51 ml/m LA Biplane Vol: 86.5 ml  43.25 ml/m  AORTIC VALVE                     PULMONIC VALVE AV Area (Vmax):    1.34 cm      PV Vmax:       0.83 m/s AV Area (Vmean):   1.32 cm      PV Vmean:      60.233 cm/s AV Area (VTI):     1.64 cm      PV VTI:        0.116 m AV Vmax:           159.75 cm/s   PV Peak grad:  2.8 mmHg AV Vmean:          104.500 cm/s  PV Mean grad:  1.7 mmHg AV VTI:            0.185 m AV Peak Grad:      10.2 mmHg AV Mean Grad:      5.2 mmHg LVOT Vmax:         94.50 cm/s LVOT Vmean:        60.900 cm/s LVOT VTI:          0.134 m LVOT/AV VTI ratio: 0.72  AORTA Ao Root diam: 2.40 cm Ao Asc diam:  2.90 cm MITRAL VALVE                TRICUSPID VALVE MV Area VTI:  1.33 cm      TR Peak grad:   34.1 mmHg MV Peak grad: 8.9 mmHg      TR Vmax:        292.00 cm/s MV Mean grad: 5.0 mmHg MV Vmax:      1.49 m/s      SHUNTS MV Vmean:     107.0 cm/s    Systemic VTI:  0.13 m MV VTI:       0.23 m        Systemic Diam: 1.70 cm MR Peak grad:  76.4 mmHg MR Mean grad: 53.5 mmHg MR Vmax:      437.00 cm/s MR Vmean:     346.5 cm/s MV E velocity: 139.00 cm/s Dani Gobble Croitoru MD Electronically signed by Sanda Klein MD Signature Date/Time: 11/13/2021/11:57:00 AM    Final  CT Renal Stone Study  Result Date: 11/12/2021 CLINICAL DATA:  Nephrolithiasis. EXAM: CT ABDOMEN AND PELVIS WITHOUT CONTRAST TECHNIQUE: Multidetector CT imaging of the abdomen and pelvis was performed following the standard protocol without IV contrast. RADIATION DOSE REDUCTION: This exam was performed according to the departmental dose-optimization program which includes automated exposure control, adjustment of the mA and/or kV according to patient size and/or use of iterative reconstruction technique. COMPARISON:  CT abdomen dated 07/18/2020. FINDINGS: Lower chest: RIGHT pleural effusion, small to moderate in size, incompletely imaged. Hepatobiliary: Liver is hyperdense. No focal mass or lesion is seen within the liver. Status post cholecystectomy. No significant bile duct dilatation appreciated. Pancreas: Unremarkable. No pancreatic ductal dilatation or surrounding inflammatory changes. Spleen: Unremarkable. Adrenals/Urinary Tract: Adrenal glands are unremarkable. Multiple LEFT renal stones, largest measuring 7 mm. No hydronephrosis. Several punctate RIGHT renal stones. No hydronephrosis. No ureteral or bladder calculi are identified. Marked amount of air within the bladder, including throughout the bladder walls, compatible with emphysematous cystitis. Stomach/Bowel: No dilated large or small bowel loops. No evidence of bowel wall inflammation. Lap band in place. Stomach otherwise unremarkable. Scattered diverticulosis of the descending and sigmoid colon but no focal inflammatory change to suggest acute diverticulitis. Vascular/Lymphatic: Aortic atherosclerosis. No abdominal aortic aneurysm. No enlarged lymph nodes seen in the abdomen or pelvis. Reproductive: Presumed hysterectomy. No  adnexal mass or free fluid seen. Other: No free fluid or abscess collection is seen in the abdomen or pelvis. No free intraperitoneal air seen. Musculoskeletal: Degenerative spondylosis of the thoracolumbar spine, mild to moderate in degree. No acute-appearing osseous abnormality. Heterogeneously dense mass-like structure involving the RIGHT gluteus musculature, extending into the overlying subcutaneous soft tissues, measuring approximately 15 x 9 x 11 cm, presumably a large hematoma, less likely abscess or neoplastic mass. IMPRESSION: 1. Marked amount of air within the bladder, including throughout the bladder walls, compatible with a severe emphysematous cystitis. 2. Very large mass-like structure involving the RIGHT gluteus musculature, extending into the overlying subcutaneous soft tissues, measuring approximately 15 x 9 x 11 cm, presumably a large hematoma, less likely abscess or neoplastic mass. Ordering physician states that patient has had a recent fall, therefore, this is consistent with a large hematoma. 3. Bilateral nephrolithiasis. No hydronephrosis. No ureteral or bladder calculi are identified. 4. Colonic diverticulosis without evidence of acute diverticulitis. 5. RIGHT pleural effusion, small to moderate in size, incompletely imaged. 6. Liver is hyperdense suggesting hemochromatosis. These results were called by telephone at the time of interpretation on 11/12/2021 at 8:27 pm to provider [facility] , who verbally acknowledged these results. Electronically Signed   By: Franki Cabot M.D.   On: 11/12/2021 20:28   CT NO CHARGE  Result Date: 11/12/2021 CLINICAL DATA:  Trauma, fall, pain EXAM: CT ADDITIONAL VIEWS AT NO CHARGE TECHNIQUE: Axial, coronal and sagittal images of right hip were processed from the CT renal stone study. CONTRAST:  None COMPARISON:  None. FINDINGS: No recent fracture is seen in the right hip. There is no dislocation. There is large area of subcutaneous contusion/hematoma in  the right gluteal region. There is 9.9 x 7 cm hematoma in the right gluteal muscles. There is another 10.2 x 5.6 cm hematoma in the subcutaneous plane. There is no significant effusion in the right hip joint. Extensive arterial calcifications are seen. There is possible peritoneal dialysis catheter in the lower abdomen. There is extensive pneumatosis in the bladder wall. There is air in the lumen of urinary bladder. Large amount of stool is seen  in the rectosigmoid. IMPRESSION: No recent fracture or dislocation is seen in the right hip. There are large hematomas in the right gluteal muscles and in the subcutaneous plane in the right gluteal region extending to the posterior upper right thigh. There is 9.9 x 7 cm hematoma in the right gluteus maximus. There is 10.2 x 5.6 cm subcutaneous hematoma posterior to the gluteal muscles. In addition, there is extensive subcutaneous edema posterior to the gluteal and subcutaneous hematomas suggesting contusion extending from the right gluteal region and along the posterior right upper thigh. There is extensive pneumatosis in the urinary bladder wall. There is air in the lumen of urinary bladder. These findings may suggest recent intervention or infectious process such as emphysematous cystitis. Electronically Signed   By: Elmer Picker M.D.   On: 11/12/2021 20:26   (Echo, Carotid, EGD, Colonoscopy, ERCP)    Subjective: Patient is resting in bed she is anxious to go to SNF she denies any new complaints  Discharge Exam: Vitals:   11/20/21 0412 11/20/21 0828  BP: 125/79 137/82  Pulse: (!) 103 (!) 107  Resp: 17 18  Temp: 98.5 F (36.9 C) 98.2 F (36.8 C)  SpO2: 92% 92%   Vitals:   11/19/21 1817 11/19/21 1951 11/20/21 0412 11/20/21 0828  BP: 125/88 124/84 125/79 137/82  Pulse: (!) 110 (!) 108 (!) 103 (!) 107  Resp: 18 17 17 18   Temp: 98.6 F (37 C) 98.4 F (36.9 C) 98.5 F (36.9 C) 98.2 F (36.8 C)  TempSrc: Oral Oral Oral   SpO2: 93% 94% 92% 92%   Weight:      Height:        General: Pt is alert, awake, not in acute distress Cardiovascular: RRR, S1/S2 +, no rubs, no gallops Respiratory: CTA bilaterally, no wheezing, no rhonchi Abdominal: Soft, NT, ND, bowel sounds + Extremities: no edema, no cyanosis    The results of significant diagnostics from this hospitalization (including imaging, microbiology, ancillary and laboratory) are listed below for reference.     Microbiology: Recent Results (from the past 240 hour(s))  Resp Panel by RT-PCR (Flu A&B, Covid) Nasopharyngeal Swab     Status: None   Collection Time: 11/12/21  2:06 PM   Specimen: Nasopharyngeal Swab; Nasopharyngeal(NP) swabs in vial transport medium  Result Value Ref Range Status   SARS Coronavirus 2 by RT PCR NEGATIVE NEGATIVE Final    Comment: (NOTE) SARS-CoV-2 target nucleic acids are NOT DETECTED.  The SARS-CoV-2 RNA is generally detectable in upper respiratory specimens during the acute phase of infection. The lowest concentration of SARS-CoV-2 viral copies this assay can detect is 138 copies/mL. A negative result does not preclude SARS-Cov-2 infection and should not be used as the sole basis for treatment or other patient management decisions. A negative result may occur with  improper specimen collection/handling, submission of specimen other than nasopharyngeal swab, presence of viral mutation(s) within the areas targeted by this assay, and inadequate number of viral copies(<138 copies/mL). A negative result must be combined with clinical observations, patient history, and epidemiological information. The expected result is Negative.  Fact Sheet for Patients:  EntrepreneurPulse.com.au  Fact Sheet for Healthcare Providers:  IncredibleEmployment.be  This test is no t yet approved or cleared by the [place] FDA and  has been authorized for detection and/or diagnosis of SARS-CoV-2 by FDA under an Emergency  Use Authorization (EUA). This EUA will remain  in effect (meaning this test can be used) for the duration of the COVID-19 declaration  under Section 564(b)(1) of the Act, 21 U.S.C.section 360bbb-3(b)(1), unless the authorization is terminated  or revoked sooner.       Influenza A by PCR NEGATIVE NEGATIVE Final   Influenza B by PCR NEGATIVE NEGATIVE Final    Comment: (NOTE) The Xpert Xpress SARS-CoV-2/FLU/RSV plus assay is intended as an aid in the diagnosis of influenza from Nasopharyngeal swab specimens and should not be used as a sole basis for treatment. Nasal washings and aspirates are unacceptable for Xpert Xpress SARS-CoV-2/FLU/RSV testing.  Fact Sheet for Patients: EntrepreneurPulse.com.au  Fact Sheet for Healthcare Providers: IncredibleEmployment.be  This test is not yet approved or cleared by the [place] FDA and has been authorized for detection and/or diagnosis of SARS-CoV-2 by FDA under an Emergency Use Authorization (EUA). This EUA will remain in effect (meaning this test can be used) for the duration of the COVID-19 declaration under Section 564(b)(1) of the Act, 21 U.S.C. section 360bbb-3(b)(1), unless the authorization is terminated or revoked.  Performed at [facility], [place] [address], [place], [place] [address]   Urine Culture     Status: Abnormal   Collection Time: 11/12/21  8:28 PM   Specimen: Urine, Clean Catch  Result Value Ref Range Status   Specimen Description URINE, CLEAN CATCH  Final   Special Requests   Final    NONE Performed at [facility], [place] [address], [place], [place] [address]    Culture (A)  Final    >=100,000 COLONIES/mL ESCHERICHIA COLI Confirmed Extended Spectrum Beta-Lactamase Producer (ESBL).  In bloodstream infections from ESBL organisms, carbapenems are preferred over piperacillin/tazobactam. They are shown to have a lower risk of mortality.    Report Status 11/15/2021 FINAL   Final   Organism ID, Bacteria ESCHERICHIA COLI (A)  Final      Susceptibility   Escherichia coli - MIC*    AMPICILLIN >=32 RESISTANT Resistant     CEFAZOLIN >=64 RESISTANT Resistant     CEFEPIME 16 RESISTANT Resistant     CEFTRIAXONE >=64 RESISTANT Resistant     CIPROFLOXACIN >=4 RESISTANT Resistant     GENTAMICIN <=1 SENSITIVE Sensitive     IMIPENEM <=0.25 SENSITIVE Sensitive     NITROFURANTOIN <=16 SENSITIVE Sensitive     TRIMETH/SULFA <=20 SENSITIVE Sensitive     AMPICILLIN/SULBACTAM 8 SENSITIVE Sensitive     PIP/TAZO <=4 SENSITIVE Sensitive     * >=100,000 COLONIES/mL ESCHERICHIA COLI  Resp Panel by RT-PCR (Flu A&B, Covid) Nasopharyngeal Swab     Status: None   Collection Time: 11/19/21  2:11 PM   Specimen: Nasopharyngeal Swab; Nasopharyngeal(NP) swabs in vial transport medium  Result Value Ref Range Status   SARS Coronavirus 2 by RT PCR NEGATIVE NEGATIVE Final    Comment: (NOTE) SARS-CoV-2 target nucleic acids are NOT DETECTED.  The SARS-CoV-2 RNA is generally detectable in upper respiratory specimens during the acute phase of infection. The lowest concentration of SARS-CoV-2 viral copies this assay can detect is 138 copies/mL. A negative result does not preclude SARS-Cov-2 infection and should not be used as the sole basis for treatment or other patient management decisions. A negative result may occur with  improper specimen collection/handling, submission of specimen other than nasopharyngeal swab, presence of viral mutation(s) within the areas targeted by this assay, and inadequate number of viral copies(<138 copies/mL). A negative result must be combined with clinical observations, patient history, and epidemiological information. The expected result is Negative.  Fact Sheet for Patients:  EntrepreneurPulse.com.au  Fact Sheet for Healthcare  Providers:  IncredibleEmployment.be  This test is no t yet approved or cleared by the  [place] and  has been authorized for detection and/or diagnosis of SARS-CoV-2 by FDA under an Emergency Use Authorization (EUA). This EUA will remain  in effect (meaning this test can be used) for the duration of the COVID-19 declaration under Section 564(b)(1) of the Act, 21 U.S.C.section 360bbb-3(b)(1), unless the authorization is terminated  or revoked sooner.       Influenza A by PCR NEGATIVE NEGATIVE Final   Influenza B by PCR NEGATIVE NEGATIVE Final    Comment: (NOTE) The Xpert Xpress SARS-CoV-2/FLU/RSV plus assay is intended as an aid in the diagnosis of influenza from Nasopharyngeal swab specimens and should not be used as a sole basis for treatment. Nasal washings and aspirates are unacceptable for Xpert Xpress SARS-CoV-2/FLU/RSV testing.  Fact Sheet for Patients: EntrepreneurPulse.com.au  Fact Sheet for Healthcare Providers: IncredibleEmployment.be  This test is not yet approved or cleared by the [place] FDA and has been authorized for detection and/or diagnosis of SARS-CoV-2 by FDA under an Emergency Use Authorization (EUA). This EUA will remain in effect (meaning this test can be used) for the duration of the COVID-19 declaration under Section 564(b)(1) of the Act, 21 U.S.C. section 360bbb-3(b)(1), unless the authorization is terminated or revoked.  Performed at [facility], [place] [address], [place], [place] [address]      Labs: BNP (last 3 results) Recent Labs    01/07/21 0502 11/13/21 0241  BNP 157.0* 102.5*   Basic Metabolic Panel: Recent Labs  Lab 11/15/21 0510 11/16/21 0127 11/17/21 0302 11/18/21 0033 11/19/21 0205 11/20/21 0145  NA 139 138 139 138 136  --   K 4.1 4.1 4.5 4.1 3.8  --   CL 108 108 108 105 102  --   CO2 25 25 26 26 27   --   GLUCOSE 102* 100* 104* 107* 108*  --   BUN 18 18 14 12 15   --   CREATININE 0.81 0.75 0.64 0.70 0.70  --   CALCIUM 7.9* 7.9* 8.0* 8.0* 8.1*  --    MG 1.8 1.7 1.6* 2.3 1.7 1.7  PHOS 2.5 2.6 2.2* 2.6 3.1 3.2   Liver Function Tests: Recent Labs  Lab 11/15/21 0510 11/16/21 0127 11/17/21 0302 11/18/21 0033 11/19/21 0205  AST 47* 45* 61* 74* 69*  ALT 49* 44 51* 58* 58*  ALKPHOS 68 65 77 83 80  BILITOT 0.6 1.0 1.7* 2.1* 2.1*  PROT 4.3* 4.4* 4.6* 4.8* 4.7*  ALBUMIN 2.0* 2.3* 2.3* 2.4* 2.4*   No results for input(s): LIPASE, AMYLASE in the last 168 hours. No results for input(s): AMMONIA in the last 168 hours. CBC: Recent Labs  Lab 11/16/21 0127 11/17/21 0302 11/18/21 0033 11/19/21 0205 11/20/21 0145  WBC 7.3 8.1 8.6 10.3 9.5  NEUTROABS 5.2 5.8 6.5 8.0* 6.9  HGB 9.1* 9.8* 10.1* 10.2* 10.7*  HCT 28.9* 30.3* 32.0* 32.2* 32.7*  MCV 89.2 88.6 88.6 88.5 88.9  PLT 156 184 201 210 239   Cardiac Enzymes: No results for input(s): CKTOTAL, CKMB, CKMBINDEX, TROPONINI in the last 168 hours. BNP: Invalid input(s): POCBNP CBG: No results for input(s): GLUCAP in the last 168 hours. D-Dimer No results for input(s): DDIMER in the last 72 hours. Hgb A1c No results for input(s): HGBA1C in the last 72 hours. Lipid Profile No results for input(s): CHOL, HDL, LDLCALC, TRIG, CHOLHDL, LDLDIRECT in the last 72 hours. Thyroid function studies No results for  input(s): TSH, T4TOTAL, T3FREE, THYROIDAB in the last 72 hours.  Invalid input(s): FREET3 Anemia work up No results for input(s): VITAMINB12, FOLATE, FERRITIN, TIBC, IRON, RETICCTPCT in the last 72 hours. Urinalysis    Component Value Date/Time   COLORURINE AMBER (A) 11/12/2021 1853   APPEARANCEUR CLOUDY (A) 11/12/2021 1853   LABSPEC 1.018 11/12/2021 1853   PHURINE 5.0 11/12/2021 1853   GLUCOSEU NEGATIVE 11/12/2021 1853   GLUCOSEU Color Interference (A) 11/15/2014 1401   HGBUR LARGE (A) 11/12/2021 1853   HGBUR large 04/29/2008 1308   BILIRUBINUR NEGATIVE 11/12/2021 1853   KETONESUR NEGATIVE 11/12/2021 1853   PROTEINUR NEGATIVE 11/12/2021 1853   UROBILINOGEN Color  Interference (A) 11/15/2014 1401   NITRITE NEGATIVE 11/12/2021 1853   LEUKOCYTESUR SMALL (A) 11/12/2021 1853   Sepsis Labs Invalid input(s): PROCALCITONIN,  WBC,  LACTICIDVEN Microbiology Recent Results (from the past 240 hour(s))  Resp Panel by RT-PCR (Flu A&B, Covid) Nasopharyngeal Swab     Status: None   Collection Time: 11/12/21  2:06 PM   Specimen: Nasopharyngeal Swab; Nasopharyngeal(NP) swabs in vial transport medium  Result Value Ref Range Status   SARS Coronavirus 2 by RT PCR NEGATIVE NEGATIVE Final    Comment: (NOTE) SARS-CoV-2 target nucleic acids are NOT DETECTED.  The SARS-CoV-2 RNA is generally detectable in upper respiratory specimens during the acute phase of infection. The lowest concentration of SARS-CoV-2 viral copies this assay can detect is 138 copies/mL. A negative result does not preclude SARS-Cov-2 infection and should not be used as the sole basis for treatment or other patient management decisions. A negative result may occur with  improper specimen collection/handling, submission of specimen other than nasopharyngeal swab, presence of viral mutation(s) within the areas targeted by this assay, and inadequate number of viral copies(<138 copies/mL). A negative result must be combined with clinical observations, patient history, and epidemiological information. The expected result is Negative.  Fact Sheet for Patients:  EntrepreneurPulse.com.au  Fact Sheet for Healthcare Providers:  IncredibleEmployment.be  This test is no t yet approved or cleared by the [place] FDA and  has been authorized for detection and/or diagnosis of SARS-CoV-2 by FDA under an Emergency Use Authorization (EUA). This EUA will remain  in effect (meaning this test can be used) for the duration of the COVID-19 declaration under Section 564(b)(1) of the Act, 21 U.S.C.section 360bbb-3(b)(1), unless the authorization is terminated  or revoked  sooner.       Influenza A by PCR NEGATIVE NEGATIVE Final   Influenza B by PCR NEGATIVE NEGATIVE Final    Comment: (NOTE) The Xpert Xpress SARS-CoV-2/FLU/RSV plus assay is intended as an aid in the diagnosis of influenza from Nasopharyngeal swab specimens and should not be used as a sole basis for treatment. Nasal washings and aspirates are unacceptable for Xpert Xpress SARS-CoV-2/FLU/RSV testing.  Fact Sheet for Patients: EntrepreneurPulse.com.au  Fact Sheet for Healthcare Providers: IncredibleEmployment.be  This test is not yet approved or cleared by the [place] FDA and has been authorized for detection and/or diagnosis of SARS-CoV-2 by FDA under an Emergency Use Authorization (EUA). This EUA will remain in effect (meaning this test can be used) for the duration of the COVID-19 declaration under Section 564(b)(1) of the Act, 21 U.S.C. section 360bbb-3(b)(1), unless the authorization is terminated or revoked.  Performed at [facility], [place] [address], [place], [place] [address]   Urine Culture     Status: Abnormal   Collection Time: 11/12/21  8:28 PM   Specimen: Urine, Clean Catch  Result Value Ref Range Status   Specimen Description URINE, CLEAN CATCH  Final   Special Requests   Final    NONE Performed at [facility], [address] [place], [place] [address]    Culture (A)  Final    >=100,000 COLONIES/mL ESCHERICHIA COLI Confirmed Extended Spectrum Beta-Lactamase Producer (ESBL).  In bloodstream infections from ESBL organisms, carbapenems are preferred over piperacillin/tazobactam. They are shown to have a lower risk of mortality.    Report Status 11/15/2021 FINAL  Final   Organism ID, Bacteria ESCHERICHIA COLI (A)  Final      Susceptibility   Escherichia coli - MIC*    AMPICILLIN >=32 RESISTANT Resistant     CEFAZOLIN >=64 RESISTANT Resistant     CEFEPIME 16 RESISTANT Resistant     CEFTRIAXONE >=64 RESISTANT  Resistant     CIPROFLOXACIN >=4 RESISTANT Resistant     GENTAMICIN <=1 SENSITIVE Sensitive     IMIPENEM <=0.25 SENSITIVE Sensitive     NITROFURANTOIN <=16 SENSITIVE Sensitive     TRIMETH/SULFA <=20 SENSITIVE Sensitive     AMPICILLIN/SULBACTAM 8 SENSITIVE Sensitive     PIP/TAZO <=4 SENSITIVE Sensitive     * >=100,000 COLONIES/mL ESCHERICHIA COLI  Resp Panel by RT-PCR (Flu A&B, Covid) Nasopharyngeal Swab     Status: None   Collection Time: 11/19/21  2:11 PM   Specimen: Nasopharyngeal Swab; Nasopharyngeal(NP) swabs in vial transport medium  Result Value Ref Range Status   SARS Coronavirus 2 by RT PCR NEGATIVE NEGATIVE Final    Comment: (NOTE) SARS-CoV-2 target nucleic acids are NOT DETECTED.  The SARS-CoV-2 RNA is generally detectable in upper respiratory specimens during the acute phase of infection. The lowest concentration of SARS-CoV-2 viral copies this assay can detect is 138 copies/mL. A negative result does not preclude SARS-Cov-2 infection and should not be used as the sole basis for treatment or other patient management decisions. A negative result may occur with  improper specimen collection/handling, submission of specimen other than nasopharyngeal swab, presence of viral mutation(s) within the areas targeted by this assay, and inadequate number of viral copies(<138 copies/mL). A negative result must be combined with clinical observations, patient history, and epidemiological information. The expected result is Negative.  Fact Sheet for Patients:  EntrepreneurPulse.com.au  Fact Sheet for Healthcare Providers:  IncredibleEmployment.be  This test is no t yet approved or cleared by the [place] FDA and  has been authorized for detection and/or diagnosis of SARS-CoV-2 by FDA under an Emergency Use Authorization (EUA). This EUA will remain  in effect (meaning this test can be used) for the duration of the COVID-19 declaration under  Section 564(b)(1) of the Act, 21 U.S.C.section 360bbb-3(b)(1), unless the authorization is terminated  or revoked sooner.       Influenza A by PCR NEGATIVE NEGATIVE Final   Influenza B by PCR NEGATIVE NEGATIVE Final    Comment: (NOTE) The Xpert Xpress SARS-CoV-2/FLU/RSV plus assay is intended as an aid in the diagnosis of influenza from Nasopharyngeal swab specimens and should not be used as a sole basis for treatment. Nasal washings and aspirates are unacceptable for Xpert Xpress SARS-CoV-2/FLU/RSV testing.  Fact Sheet for Patients: EntrepreneurPulse.com.au  Fact Sheet for Healthcare Providers: IncredibleEmployment.be  This test is not yet approved or cleared by the [place] FDA and has been authorized for detection and/or diagnosis of SARS-CoV-2 by FDA under an Emergency Use Authorization (EUA). This EUA will remain in effect (meaning this test can be used) for the duration of the  COVID-19 declaration under Section 564(b)(1) of the Act, 21 U.S.C. section 360bbb-3(b)(1), unless the authorization is terminated or revoked.  Performed at [facility], [place] [address] [place], [place] [address]      Time coordinating discharge:  39 minutes  SIGNED:   Georgette Shell, MD  [facility] 11/20/2021, 9:32 AM

## 2021-11-20 NOTE — Progress Notes (Signed)
Mobility Specialist Progress Note:   11/20/21 0957  Mobility  Bed Position Chair  Activity Ambulated with assistance in room  Level of Assistance Minimal assist, patient does 75% or more  Assistive Device Front wheel walker  Distance Ambulated (ft) 20 ft  Activity Response Tolerated well  $Mobility charge 1 Mobility   Pt received in bed willing to participate in mobility. Complaints of 7/10 L hip pain. Left in chair with call bell in reach and all needs met.   [facility] Public librarian Phone [phone]

## 2021-11-22 ENCOUNTER — Other Ambulatory Visit: Payer: Self-pay | Admitting: *Deleted

## 2021-11-22 DIAGNOSIS — R932 Abnormal findings on diagnostic imaging of liver and biliary tract: Secondary | ICD-10-CM | POA: Diagnosis not present

## 2021-11-22 DIAGNOSIS — N390 Urinary tract infection, site not specified: Secondary | ICD-10-CM | POA: Diagnosis not present

## 2021-11-22 DIAGNOSIS — R0600 Dyspnea, unspecified: Secondary | ICD-10-CM | POA: Diagnosis not present

## 2021-11-22 DIAGNOSIS — N3080 Other cystitis without hematuria: Secondary | ICD-10-CM | POA: Diagnosis not present

## 2021-11-22 DIAGNOSIS — E785 Hyperlipidemia, unspecified: Secondary | ICD-10-CM | POA: Diagnosis not present

## 2021-11-22 DIAGNOSIS — S300XXA Contusion of lower back and pelvis, initial encounter: Secondary | ICD-10-CM | POA: Diagnosis not present

## 2021-11-22 DIAGNOSIS — Z950 Presence of cardiac pacemaker: Secondary | ICD-10-CM | POA: Diagnosis not present

## 2021-11-22 DIAGNOSIS — R197 Diarrhea, unspecified: Secondary | ICD-10-CM | POA: Diagnosis not present

## 2021-11-22 DIAGNOSIS — R262 Difficulty in walking, not elsewhere classified: Secondary | ICD-10-CM | POA: Diagnosis not present

## 2021-11-22 DIAGNOSIS — R5381 Other malaise: Secondary | ICD-10-CM | POA: Diagnosis not present

## 2021-11-22 DIAGNOSIS — R059 Cough, unspecified: Secondary | ICD-10-CM | POA: Diagnosis not present

## 2021-11-22 DIAGNOSIS — I951 Orthostatic hypotension: Secondary | ICD-10-CM | POA: Diagnosis not present

## 2021-11-22 DIAGNOSIS — D649 Anemia, unspecified: Secondary | ICD-10-CM | POA: Diagnosis not present

## 2021-11-22 DIAGNOSIS — I4891 Unspecified atrial fibrillation: Secondary | ICD-10-CM | POA: Diagnosis not present

## 2021-11-22 DIAGNOSIS — Z8673 Personal history of transient ischemic attack (TIA), and cerebral infarction without residual deficits: Secondary | ICD-10-CM | POA: Diagnosis not present

## 2021-11-22 DIAGNOSIS — B9620 Unspecified Escherichia coli [E. coli] as the cause of diseases classified elsewhere: Secondary | ICD-10-CM | POA: Diagnosis not present

## 2021-11-22 DIAGNOSIS — C8590 Non-Hodgkin lymphoma, unspecified, unspecified site: Secondary | ICD-10-CM | POA: Diagnosis not present

## 2021-11-22 DIAGNOSIS — E039 Hypothyroidism, unspecified: Secondary | ICD-10-CM | POA: Diagnosis not present

## 2021-11-22 DIAGNOSIS — R5383 Other fatigue: Secondary | ICD-10-CM | POA: Diagnosis not present

## 2021-11-22 NOTE — Patient Outreach (Signed)
[facility] Post- Acute Care Coordinator follow up. Per [place] eligible member resides in [facility].  Screened for potential [facility] services as a benefit of member's insurance plan. ? ?[name] admitted to [facility] on 11/20/21. ? ?Will follow up with [name] to discuss [facility] services. ? ?Will continue to follow while member resides in SNF. ? ? ?[name], MSN, RN,BSN ?[facility] Coordinator ?[phone] [facility]) ?[phone]  (Toll free office)  ? ?

## 2021-11-27 DIAGNOSIS — I951 Orthostatic hypotension: Secondary | ICD-10-CM | POA: Diagnosis not present

## 2021-11-27 DIAGNOSIS — E039 Hypothyroidism, unspecified: Secondary | ICD-10-CM | POA: Diagnosis not present

## 2021-11-27 DIAGNOSIS — I119 Hypertensive heart disease without heart failure: Secondary | ICD-10-CM | POA: Diagnosis not present

## 2021-11-27 DIAGNOSIS — R059 Cough, unspecified: Secondary | ICD-10-CM | POA: Diagnosis not present

## 2021-11-27 DIAGNOSIS — R932 Abnormal findings on diagnostic imaging of liver and biliary tract: Secondary | ICD-10-CM | POA: Diagnosis not present

## 2021-11-27 DIAGNOSIS — Z950 Presence of cardiac pacemaker: Secondary | ICD-10-CM | POA: Diagnosis not present

## 2021-11-27 DIAGNOSIS — I4891 Unspecified atrial fibrillation: Secondary | ICD-10-CM | POA: Diagnosis not present

## 2021-11-27 DIAGNOSIS — R7401 Elevation of levels of liver transaminase levels: Secondary | ICD-10-CM | POA: Diagnosis not present

## 2021-11-27 DIAGNOSIS — S300XXA Contusion of lower back and pelvis, initial encounter: Secondary | ICD-10-CM | POA: Diagnosis not present

## 2021-11-27 DIAGNOSIS — R197 Diarrhea, unspecified: Secondary | ICD-10-CM | POA: Diagnosis not present

## 2021-11-27 DIAGNOSIS — M6281 Muscle weakness (generalized): Secondary | ICD-10-CM | POA: Diagnosis not present

## 2021-11-27 DIAGNOSIS — I509 Heart failure, unspecified: Secondary | ICD-10-CM | POA: Diagnosis not present

## 2021-11-27 DIAGNOSIS — E46 Unspecified protein-calorie malnutrition: Secondary | ICD-10-CM | POA: Diagnosis not present

## 2021-11-27 DIAGNOSIS — R41841 Cognitive communication deficit: Secondary | ICD-10-CM | POA: Diagnosis not present

## 2021-11-27 DIAGNOSIS — D649 Anemia, unspecified: Secondary | ICD-10-CM | POA: Diagnosis not present

## 2021-11-27 DIAGNOSIS — Z9181 History of falling: Secondary | ICD-10-CM | POA: Diagnosis not present

## 2021-11-29 ENCOUNTER — Other Ambulatory Visit: Payer: Self-pay | Admitting: *Deleted

## 2021-11-29 DIAGNOSIS — I4891 Unspecified atrial fibrillation: Secondary | ICD-10-CM | POA: Diagnosis not present

## 2021-11-29 DIAGNOSIS — I119 Hypertensive heart disease without heart failure: Secondary | ICD-10-CM | POA: Diagnosis not present

## 2021-11-29 DIAGNOSIS — Z9181 History of falling: Secondary | ICD-10-CM | POA: Diagnosis not present

## 2021-11-29 DIAGNOSIS — R41841 Cognitive communication deficit: Secondary | ICD-10-CM | POA: Diagnosis not present

## 2021-11-29 DIAGNOSIS — I951 Orthostatic hypotension: Secondary | ICD-10-CM | POA: Diagnosis not present

## 2021-11-29 DIAGNOSIS — Z950 Presence of cardiac pacemaker: Secondary | ICD-10-CM | POA: Diagnosis not present

## 2021-11-29 DIAGNOSIS — M6281 Muscle weakness (generalized): Secondary | ICD-10-CM | POA: Diagnosis not present

## 2021-11-29 DIAGNOSIS — I509 Heart failure, unspecified: Secondary | ICD-10-CM | POA: Diagnosis not present

## 2021-11-29 NOTE — Patient Outreach (Signed)
[facility] Post- Acute Care Coordinator follow up. Per [facility] eligible member resides in  [facility].  Screened for potential [facility] Care Management services as a benefit of member's insurance plan. ? ?Facility site visit to [facility]. Met with Kirstin, SNF SW concerning member's progress, transition plan, and potential [facility] needs. Anticipated transition plan is ALF. Member's sister/DPR Kayla Harrison is coming down from [place] to look into some ALFs. Member lived alone prior and previously released her paid caregivers.  ? ?Spoke with Kayla Harrison  in room at [facility] to discuss transition plans [facility] follow up. Kayla Harrison reports she rather return home but is open to ALF. States she has a list of ALFs she researching.  ? ?Provided [facility] Care Management brochure, 24-hr nurse advice line magnet, and writer's contact information.  ? ?Will continue to collaborate with SNF SW and follow transition plans/needs while member resides in SNF.  ? ? ? , MSN, RN,BSN ?[facility] Post Acute Care Coordinator ?[phone] ( [employer]) ?[phone]  (Toll free office)  ? ? ? ? ?  ?

## 2021-12-03 DIAGNOSIS — M25551 Pain in right hip: Secondary | ICD-10-CM | POA: Diagnosis not present

## 2021-12-03 DIAGNOSIS — R41841 Cognitive communication deficit: Secondary | ICD-10-CM | POA: Diagnosis not present

## 2021-12-03 DIAGNOSIS — I509 Heart failure, unspecified: Secondary | ICD-10-CM | POA: Diagnosis not present

## 2021-12-03 DIAGNOSIS — Z9181 History of falling: Secondary | ICD-10-CM | POA: Diagnosis not present

## 2021-12-03 DIAGNOSIS — Z950 Presence of cardiac pacemaker: Secondary | ICD-10-CM | POA: Diagnosis not present

## 2021-12-03 DIAGNOSIS — Z20822 Contact with and (suspected) exposure to covid-19: Secondary | ICD-10-CM | POA: Diagnosis not present

## 2021-12-03 DIAGNOSIS — I119 Hypertensive heart disease without heart failure: Secondary | ICD-10-CM | POA: Diagnosis not present

## 2021-12-03 DIAGNOSIS — I4891 Unspecified atrial fibrillation: Secondary | ICD-10-CM | POA: Diagnosis not present

## 2021-12-03 DIAGNOSIS — M6281 Muscle weakness (generalized): Secondary | ICD-10-CM | POA: Diagnosis not present

## 2021-12-03 DIAGNOSIS — I951 Orthostatic hypotension: Secondary | ICD-10-CM | POA: Diagnosis not present

## 2021-12-05 DIAGNOSIS — M6281 Muscle weakness (generalized): Secondary | ICD-10-CM | POA: Diagnosis not present

## 2021-12-05 DIAGNOSIS — I951 Orthostatic hypotension: Secondary | ICD-10-CM | POA: Diagnosis not present

## 2021-12-05 DIAGNOSIS — I119 Hypertensive heart disease without heart failure: Secondary | ICD-10-CM | POA: Diagnosis not present

## 2021-12-05 DIAGNOSIS — Z950 Presence of cardiac pacemaker: Secondary | ICD-10-CM | POA: Diagnosis not present

## 2021-12-05 DIAGNOSIS — D649 Anemia, unspecified: Secondary | ICD-10-CM | POA: Diagnosis not present

## 2021-12-05 DIAGNOSIS — I4891 Unspecified atrial fibrillation: Secondary | ICD-10-CM | POA: Diagnosis not present

## 2021-12-05 DIAGNOSIS — Z9181 History of falling: Secondary | ICD-10-CM | POA: Diagnosis not present

## 2021-12-05 DIAGNOSIS — M25551 Pain in right hip: Secondary | ICD-10-CM | POA: Diagnosis not present

## 2021-12-05 DIAGNOSIS — S300XXD Contusion of lower back and pelvis, subsequent encounter: Secondary | ICD-10-CM | POA: Diagnosis not present

## 2021-12-05 DIAGNOSIS — R41841 Cognitive communication deficit: Secondary | ICD-10-CM | POA: Diagnosis not present

## 2021-12-05 DIAGNOSIS — Z9189 Other specified personal risk factors, not elsewhere classified: Secondary | ICD-10-CM | POA: Diagnosis not present

## 2021-12-05 DIAGNOSIS — I509 Heart failure, unspecified: Secondary | ICD-10-CM | POA: Diagnosis not present

## 2021-12-06 ENCOUNTER — Other Ambulatory Visit: Payer: Self-pay | Admitting: *Deleted

## 2021-12-06 DIAGNOSIS — Z20822 Contact with and (suspected) exposure to covid-19: Secondary | ICD-10-CM | POA: Diagnosis not present

## 2021-12-06 NOTE — Patient Outreach (Signed)
THN Post- Acute Care Coordinator follow up. Per [place] eligible member currently resides in [facility]. Screened for potential [facility] Care Management services as a benefit of member's insurance plan. ? ?Facility site visit to [facility] skilled nursing facility. Met with Kirstin, SNF SW concerning transition plan and potential THN needs. Kirstin states member vacillates between ALF vs home. Uncertain of transition. Care plan meeting is scheduled for next week. Will know more at that time. ? ?Will continue to collaborate with SNF SW and follow transition plans/needs while member resides in SNF.  ? ? ?[name], MSN, RN,BSN ?[place] Coordinator ?[phone] [facility]) ?[phone]  (Toll free office)  ? ? ? ? ?  ?

## 2021-12-10 DIAGNOSIS — M6281 Muscle weakness (generalized): Secondary | ICD-10-CM | POA: Diagnosis not present

## 2021-12-10 DIAGNOSIS — I4891 Unspecified atrial fibrillation: Secondary | ICD-10-CM | POA: Diagnosis not present

## 2021-12-10 DIAGNOSIS — R41841 Cognitive communication deficit: Secondary | ICD-10-CM | POA: Diagnosis not present

## 2021-12-10 DIAGNOSIS — I951 Orthostatic hypotension: Secondary | ICD-10-CM | POA: Diagnosis not present

## 2021-12-10 DIAGNOSIS — I509 Heart failure, unspecified: Secondary | ICD-10-CM | POA: Diagnosis not present

## 2021-12-10 DIAGNOSIS — M25551 Pain in right hip: Secondary | ICD-10-CM | POA: Diagnosis not present

## 2021-12-10 DIAGNOSIS — Z9181 History of falling: Secondary | ICD-10-CM | POA: Diagnosis not present

## 2021-12-10 DIAGNOSIS — Z950 Presence of cardiac pacemaker: Secondary | ICD-10-CM | POA: Diagnosis not present

## 2021-12-10 DIAGNOSIS — I119 Hypertensive heart disease without heart failure: Secondary | ICD-10-CM | POA: Diagnosis not present

## 2021-12-11 ENCOUNTER — Encounter: Payer: Self-pay | Admitting: Physical Medicine and Rehabilitation

## 2021-12-11 ENCOUNTER — Other Ambulatory Visit: Payer: Self-pay

## 2021-12-11 ENCOUNTER — Encounter
Payer: Medicare Other | Attending: Physical Medicine and Rehabilitation | Admitting: Physical Medicine and Rehabilitation

## 2021-12-11 VITALS — BP 112/75 | HR 106 | Ht 66.0 in

## 2021-12-11 DIAGNOSIS — F4323 Adjustment disorder with mixed anxiety and depressed mood: Secondary | ICD-10-CM | POA: Diagnosis not present

## 2021-12-11 DIAGNOSIS — R296 Repeated falls: Secondary | ICD-10-CM | POA: Diagnosis not present

## 2021-12-11 DIAGNOSIS — I4891 Unspecified atrial fibrillation: Secondary | ICD-10-CM | POA: Insufficient documentation

## 2021-12-11 DIAGNOSIS — I890 Lymphedema, not elsewhere classified: Secondary | ICD-10-CM | POA: Insufficient documentation

## 2021-12-11 DIAGNOSIS — F5102 Adjustment insomnia: Secondary | ICD-10-CM | POA: Diagnosis not present

## 2021-12-11 NOTE — Progress Notes (Signed)
? ?Subjective:  ? ? Patient ID: Kayla Harrison, female    DOB: Oct 12, 1945, 76 y.o.   MRN: [id number] ? ?HPI  ?Kayla Harrison is a 76 year old woman who presents for follow-up of ICH, frequent falls.  ? ?1) Left hip hematoma: ?-her walking has improved ?-her energy has improved ?-she feels there is still one suture in place, and she is right. I have pulled it out. Discussed that it appears to be healing very well. She is concerned about the darkened area.  ? ?2) Right arm hematoma:  ?-much better.  ?-healed ? ?3) Bilateral lower extremity lymphedema: ?-Needs a refill of her Lasix.  ?-she has not done lymphedema therapy ?-she has compression garments for  ?-improved ? ?4) Hypotension: ?-improved ?-tending to be on the edge ?-currently 138/91- she is aware that both numbers and slightly high ?-no more dizziness.  ?-she has been off metoprolol.  ? ?5) Insomnia: ?-trazodone leaves her drugged the next day ? ?6) Loose stools: ?-no more ?-small Bm this morning.  ? ?7) Hypoalbuminemia: ?-has been trying to eat more nuts ? ?8) Impaired mobility:  ?-she has gotten deconditioned again, has not been receiving home therapy as frequently as she would like ?-her friend is helping her driving and grocery shopping.  ? ?9) ICH: ?-has ocassional memory loss ?-she has a lady who helps her and she says she always finds fault with her. ?-she asks when she can return to driving.  ? ?10) Frequent falls ?-has had 4 falls in the last month ?-no injuries after the fall ?-she denies dizziness ?-she tripped over clothes once.  ? ?11) Decreased appetite ?-does not feel she needs a stimulant.  ? ? ?Pain Inventory ?Average Pain 0 ?Pain Right Now 0 ?My pain is no pain ? ?In the last 24 hours, has pain interfered with the following? ?General activity 0 ?Relation with others 0 ?Enjoyment of life 0 ?What TIME of day is your pain at its worst? morning  ?Sleep (in general) NA  Has trouble staying asleep ? ?Pain is worse with: no pain ?Pain improves  with: no pain ?Relief from Meds: 0 ? ?use a walker  going to outpt therapy ? ?I need assistance with the following:  household duties and shopping ? ?tingling ? ?Any changes since last visit?  yes  Saw the surgeon yesterday ? ?Any changes since last visit?  no   ? ? ? ?Family History  ?Problem Relation Age of Onset  ? Heart disease Father 64  ?     MI _0   ? Colon cancer Father   ?     COLON  ? Heart attack Father   ? Other Mother   ?     temporal arteritis   ? Diabetes Sister   ? Diabetes Brother   ? Diabetes Paternal Aunt   ? Diabetes Paternal Grandmother   ? Esophageal cancer Neg Hx   ? Inflammatory bowel disease Neg Hx   ? Liver disease Neg Hx   ? Pancreatic cancer Neg Hx   ? Stomach cancer Neg Hx   ? ?Social History  ? ?Socioeconomic History  ? Marital status: Single  ?  Spouse name: Not on file  ? Number of children: 0  ? Years of education: Not on file  ? Highest education level: Master's degree (e.g., MA, MS, MEng, MEd, MSW, MBA)  ?Occupational History  ? 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  ?  Employer: RETIRED  ?Tobacco Use  ? Smoking  status: Never  ? Smokeless tobacco: Never  ?Vaping Use  ? Vaping Use: Never used  ?Substance and Sexual Activity  ? Alcohol use: No  ?  Comment: none since 1990  ? Drug use: Never  ? Sexual activity: Not Currently  ?  Birth control/protection: Surgical  ?Other Topics Concern  ? Not on file  ?Social History Narrative  ? ** Merged History Encounter **  ?    ? SINGLE  ?NO CHILDREN ?NEVER SMOKED ?EXERCISE 3 X WK ?RETIRED ?NO CAFFEINE ? ? ?  ? ?Social Determinants of Health  ? ?Financial Resource Strain: Not on file  ?Food Insecurity: Not on file  ?Transportation Needs: Not on file  ?Physical Activity: Not on file  ?Stress: Not on file  ?Social Connections: Not on file  ? ?Past Surgical History:  ?Procedure Laterality Date  ? ABDOMINAL HYSTERECTOMY  1983  ? afib ablation    ? a. 2008 PVI b. 2014 convergent ablation  ? APPENDECTOMY    ? BONE MARROW BIOPSY   02/21/2017  ? BREAST LUMPECTOMY Left 2010  ? bso  1998  ? CARDIAC CATHETERIZATION    ? 2015- negative  ? CARDIOVERSION  10/09/2012  ? Procedure: CARDIOVERSION;  Surgeon: Minus Breeding, MD;  Location: [place];  Service: Cardiovascular;  Laterality: N/A;  ? CARDIOVERSION  10/09/2012  ? Procedure: CARDIOVERSION;  Surgeon: Minus Breeding, MD;  Location: [facility];  Service: Cardiovascular;  Laterality: N/A;  [name] gave the ok to add pt to the add on , but we must check to find out if the can add pt on at [phone] ( [phone])  ? CARDIOVERSION N/A 11/20/2012  ? Procedure: CARDIOVERSION;  Surgeon: Fay Records, MD;  Location: [place];  Service: Cardiovascular;  Laterality: N/A;  ? CARDIOVERSION N/A 07/18/2017  ? Procedure: CARDIOVERSION;  Surgeon: Thayer Headings, MD;  Location: [place];  Service: Cardiovascular;  Laterality: N/A;  ? CARDIOVERSION N/A 10/03/2017  ? Procedure: CARDIOVERSION;  Surgeon: Sanda Klein, MD;  Location: [place];  Service: Cardiovascular;  Laterality: N/A;  ? CARDIOVERSION N/A 01/07/2018  ? Procedure: CARDIOVERSION;  Surgeon: Thayer Headings, MD;  Location: [place];  Service: Cardiovascular;  Laterality: N/A;  ? CARDIOVERSION N/A 12/10/2019  ? Procedure: CARDIOVERSION;  Surgeon: Buford Dresser, MD;  Location: [place];  Service: Cardiovascular;  Laterality: N/A;  ? CARDIOVERSION N/A 03/09/2021  ? Procedure: CARDIOVERSION;  Surgeon: Pixie Casino, MD;  Location: [place];  Service: Cardiovascular;  Laterality: N/A;  ? CARDIOVERSION N/A 10/04/2021  ? Procedure: CARDIOVERSION;  Surgeon: Jerline Pain, MD;  Location: [facility]COPY;  Service: Cardiovascular;  Laterality: N/A;  ? CHOLECYSTECTOMY    ? COLONOSCOPY W/ POLYPECTOMY    ? Dr Earlean Shawl  ? CYSTOSCOPY N/A 02/06/2015  ? Procedure: CYSTOSCOPY;  Surgeon: Kathie Rhodes, MD;  Location: [facility];  Service: Urology;  Laterality: N/A;  ? CYSTOSCOPY W/ RETROGRADES Left 11/17/2017  ? Procedure: CYSTOSCOPY WITH RETROGRADE /PYELOGRAM/;   Surgeon: Kathie Rhodes, MD;  Location: [facility];  Service: Urology;  Laterality: Left;  ? CYSTOSCOPY WITH RETROGRADE PYELOGRAM, URETEROSCOPY AND STENT PLACEMENT Right 02/06/2015  ? Procedure: RETROGRADE PYELOGRAM, RIGHT URETEROSCOPY STENT PLACEMENT;  Surgeon: Kathie Rhodes, MD;  Location: [facility];  Service: Urology;  Laterality: Right;  ? CYSTOSCOPY WITH RETROGRADE PYELOGRAM, URETEROSCOPY AND STENT PLACEMENT Right 03/07/2017  ? Procedure: CYSTOSCOPY WITH RIGHT RETROGRADE PYELOGRAM,RIGHT  URETEROSCOPYLASER LITHOTRIPSY  AND STENT PLACEMENT AND STONE BASKETRY;  Surgeon: Kathie Rhodes, MD;  Location: [place];  Service: Urology;  Laterality: Right;  ? EYE SURGERY    ? cataract surgery  ? fatty mass removal  1999  ? pubic area  ? HOLMIUM LASER APPLICATION N/A 2/90/2111  ? Procedure: HOLMIUM LASER APPLICATION;  Surgeon: Kathie Rhodes, MD;  Location: [facility];  Service: Urology;  Laterality: N/A;  ? HOLMIUM LASER APPLICATION Right 5/52/0802  ? Procedure: HOLMIUM LASER APPLICATION;  Surgeon: Kathie Rhodes, MD;  Location: [facility];  Service: Urology;  Laterality: Right;  ? HOLMIUM LASER APPLICATION Left 2/33/6122  ? Procedure: HOLMIUM LASER APPLICATION;  Surgeon: Kathie Rhodes, MD;  Location: [facility];  Service: Urology;  Laterality: Left;  ? I & D EXTREMITY Left 12/19/2020  ? Procedure: IRRIGATION AND DEBRIDEMENT OF LEFT HIP HEMATOMA WITH APPLICATION OF WOUND VAC;  Surgeon: Erle Crocker, MD;  Location: [place];  Service: Orthopedics;  Laterality: Left;  ? I & D EXTREMITY Left 02/06/2021  ? Procedure: IRRIGATION AND DEBRIDEMENT DEEP ABCESS LEFT THIGH, SECONDARY CLOSURE OF WOUND DEHISCENCE;  Surgeon: Erle Crocker, MD;  Location: [place];  Service: Orthopedics;  Laterality: Left;  ? IR FLUORO GUIDE PORT INSERTION RIGHT  02/24/2017  ? IR NEPHROSTOMY PLACEMENT RIGHT  11/17/2017  ? IR PATIENT EVAL TECH 0-60 MINS  03/11/2017  ? IR REMOVAL TUN ACCESS W/ PORT W/O FL MOD SED  04/20/2018  ? IR US GUIDE  VASC ACCESS RIGHT  02/24/2017  ? KNEE ARTHROSCOPY    ? bilateral  ? LAPAROSCOPIC GASTRIC BANDING  07/10/2010  ? LAPAROSCOPIC GASTRIC BANDING    ? Laparoscopic adjustable banding APS System with posterior

## 2021-12-12 DIAGNOSIS — Z9181 History of falling: Secondary | ICD-10-CM | POA: Diagnosis not present

## 2021-12-12 DIAGNOSIS — I951 Orthostatic hypotension: Secondary | ICD-10-CM | POA: Diagnosis not present

## 2021-12-12 DIAGNOSIS — Z950 Presence of cardiac pacemaker: Secondary | ICD-10-CM | POA: Diagnosis not present

## 2021-12-12 DIAGNOSIS — M6281 Muscle weakness (generalized): Secondary | ICD-10-CM | POA: Diagnosis not present

## 2021-12-12 DIAGNOSIS — M25551 Pain in right hip: Secondary | ICD-10-CM | POA: Diagnosis not present

## 2021-12-12 DIAGNOSIS — I509 Heart failure, unspecified: Secondary | ICD-10-CM | POA: Diagnosis not present

## 2021-12-12 DIAGNOSIS — I4891 Unspecified atrial fibrillation: Secondary | ICD-10-CM | POA: Diagnosis not present

## 2021-12-12 DIAGNOSIS — R41841 Cognitive communication deficit: Secondary | ICD-10-CM | POA: Diagnosis not present

## 2021-12-12 DIAGNOSIS — I119 Hypertensive heart disease without heart failure: Secondary | ICD-10-CM | POA: Diagnosis not present

## 2021-12-13 DIAGNOSIS — R5381 Other malaise: Secondary | ICD-10-CM | POA: Diagnosis not present

## 2021-12-13 DIAGNOSIS — I4891 Unspecified atrial fibrillation: Secondary | ICD-10-CM | POA: Diagnosis not present

## 2021-12-13 DIAGNOSIS — W19XXXA Unspecified fall, initial encounter: Secondary | ICD-10-CM | POA: Diagnosis not present

## 2021-12-14 DIAGNOSIS — Z23 Encounter for immunization: Secondary | ICD-10-CM | POA: Diagnosis not present

## 2021-12-14 DIAGNOSIS — I951 Orthostatic hypotension: Secondary | ICD-10-CM | POA: Diagnosis not present

## 2021-12-14 DIAGNOSIS — R0600 Dyspnea, unspecified: Secondary | ICD-10-CM | POA: Diagnosis not present

## 2021-12-14 DIAGNOSIS — S300XXD Contusion of lower back and pelvis, subsequent encounter: Secondary | ICD-10-CM | POA: Diagnosis not present

## 2021-12-17 DIAGNOSIS — R41841 Cognitive communication deficit: Secondary | ICD-10-CM | POA: Diagnosis not present

## 2021-12-17 DIAGNOSIS — I119 Hypertensive heart disease without heart failure: Secondary | ICD-10-CM | POA: Diagnosis not present

## 2021-12-17 DIAGNOSIS — I951 Orthostatic hypotension: Secondary | ICD-10-CM | POA: Diagnosis not present

## 2021-12-17 DIAGNOSIS — I4891 Unspecified atrial fibrillation: Secondary | ICD-10-CM | POA: Diagnosis not present

## 2021-12-17 DIAGNOSIS — M6281 Muscle weakness (generalized): Secondary | ICD-10-CM | POA: Diagnosis not present

## 2021-12-17 DIAGNOSIS — M25551 Pain in right hip: Secondary | ICD-10-CM | POA: Diagnosis not present

## 2021-12-17 DIAGNOSIS — Z9181 History of falling: Secondary | ICD-10-CM | POA: Diagnosis not present

## 2021-12-17 DIAGNOSIS — Z950 Presence of cardiac pacemaker: Secondary | ICD-10-CM | POA: Diagnosis not present

## 2021-12-17 DIAGNOSIS — I509 Heart failure, unspecified: Secondary | ICD-10-CM | POA: Diagnosis not present

## 2021-12-18 DIAGNOSIS — I951 Orthostatic hypotension: Secondary | ICD-10-CM | POA: Diagnosis not present

## 2021-12-18 DIAGNOSIS — Z9189 Other specified personal risk factors, not elsewhere classified: Secondary | ICD-10-CM | POA: Diagnosis not present

## 2021-12-19 ENCOUNTER — Encounter: Payer: Medicare Other | Admitting: Internal Medicine

## 2021-12-19 ENCOUNTER — Other Ambulatory Visit: Payer: Self-pay | Admitting: *Deleted

## 2021-12-19 DIAGNOSIS — M6281 Muscle weakness (generalized): Secondary | ICD-10-CM | POA: Diagnosis not present

## 2021-12-19 DIAGNOSIS — I951 Orthostatic hypotension: Secondary | ICD-10-CM | POA: Diagnosis not present

## 2021-12-19 DIAGNOSIS — R41841 Cognitive communication deficit: Secondary | ICD-10-CM | POA: Diagnosis not present

## 2021-12-19 DIAGNOSIS — Z9181 History of falling: Secondary | ICD-10-CM | POA: Diagnosis not present

## 2021-12-19 DIAGNOSIS — M25551 Pain in right hip: Secondary | ICD-10-CM | POA: Diagnosis not present

## 2021-12-19 DIAGNOSIS — I498 Other specified cardiac arrhythmias: Secondary | ICD-10-CM

## 2021-12-19 DIAGNOSIS — I119 Hypertensive heart disease without heart failure: Secondary | ICD-10-CM | POA: Diagnosis not present

## 2021-12-19 DIAGNOSIS — I4819 Other persistent atrial fibrillation: Secondary | ICD-10-CM

## 2021-12-19 DIAGNOSIS — I509 Heart failure, unspecified: Secondary | ICD-10-CM | POA: Diagnosis not present

## 2021-12-19 DIAGNOSIS — Z950 Presence of cardiac pacemaker: Secondary | ICD-10-CM | POA: Diagnosis not present

## 2021-12-19 DIAGNOSIS — I4891 Unspecified atrial fibrillation: Secondary | ICD-10-CM | POA: Diagnosis not present

## 2021-12-19 NOTE — Patient Outreach (Signed)
THN Post- Acute Care Coordinator follow up. Per [place] eligible member currently resides in [facility].  Screening for potential [facility] Care Management services as a benefit of member's insurance plan. ? ?Update received from [facility] indicating member continues to work with therapy. Transition plan is for ALF. ? ?Will continue to follow while member resides in SNF.  ? ? ? ?[name], MSN, RN,BSN ?[place] Coordinator ?[phone] [facility]) ?[phone]  (Toll free office)   ?

## 2021-12-21 DIAGNOSIS — I700 Atherosclerosis of aorta: Secondary | ICD-10-CM | POA: Diagnosis not present

## 2021-12-21 DIAGNOSIS — N1831 Chronic kidney disease, stage 3a: Secondary | ICD-10-CM | POA: Diagnosis not present

## 2021-12-21 DIAGNOSIS — I480 Paroxysmal atrial fibrillation: Secondary | ICD-10-CM | POA: Diagnosis not present

## 2021-12-26 ENCOUNTER — Inpatient Hospital Stay (HOSPITAL_COMMUNITY)
Admission: EM | Admit: 2021-12-26 | Discharge: 2022-01-02 | DRG: 872 | Disposition: A | Discharge status: Skilled Nursing Facility | Payer: Medicare Other | Attending: Internal Medicine | Admitting: Internal Medicine

## 2021-12-26 ENCOUNTER — Emergency Department (HOSPITAL_COMMUNITY): Payer: Medicare Other

## 2021-12-26 DIAGNOSIS — Z9989 Dependence on other enabling machines and devices: Secondary | ICD-10-CM | POA: Diagnosis not present

## 2021-12-26 DIAGNOSIS — Z8673 Personal history of transient ischemic attack (TIA), and cerebral infarction without residual deficits: Secondary | ICD-10-CM | POA: Diagnosis not present

## 2021-12-26 DIAGNOSIS — W19XXXA Unspecified fall, initial encounter: Secondary | ICD-10-CM | POA: Diagnosis not present

## 2021-12-26 DIAGNOSIS — E876 Hypokalemia: Secondary | ICD-10-CM | POA: Diagnosis present

## 2021-12-26 DIAGNOSIS — E860 Dehydration: Secondary | ICD-10-CM | POA: Diagnosis present

## 2021-12-26 DIAGNOSIS — R932 Abnormal findings on diagnostic imaging of liver and biliary tract: Secondary | ICD-10-CM | POA: Diagnosis not present

## 2021-12-26 DIAGNOSIS — Z9221 Personal history of antineoplastic chemotherapy: Secondary | ICD-10-CM

## 2021-12-26 DIAGNOSIS — E890 Postprocedural hypothyroidism: Secondary | ICD-10-CM | POA: Diagnosis present

## 2021-12-26 DIAGNOSIS — E039 Hypothyroidism, unspecified: Secondary | ICD-10-CM | POA: Diagnosis not present

## 2021-12-26 DIAGNOSIS — Z800 Family history of malignant neoplasm of digestive organs: Secondary | ICD-10-CM

## 2021-12-26 DIAGNOSIS — I4892 Unspecified atrial flutter: Secondary | ICD-10-CM | POA: Diagnosis not present

## 2021-12-26 DIAGNOSIS — R627 Adult failure to thrive: Secondary | ICD-10-CM | POA: Diagnosis not present

## 2021-12-26 DIAGNOSIS — R1314 Dysphagia, pharyngoesophageal phase: Secondary | ICD-10-CM | POA: Diagnosis not present

## 2021-12-26 DIAGNOSIS — Z833 Family history of diabetes mellitus: Secondary | ICD-10-CM

## 2021-12-26 DIAGNOSIS — F0393 Unspecified dementia, unspecified severity, with mood disturbance: Secondary | ICD-10-CM | POA: Diagnosis present

## 2021-12-26 DIAGNOSIS — I10 Essential (primary) hypertension: Secondary | ICD-10-CM | POA: Diagnosis not present

## 2021-12-26 DIAGNOSIS — R2681 Unsteadiness on feet: Secondary | ICD-10-CM | POA: Diagnosis not present

## 2021-12-26 DIAGNOSIS — F32A Depression, unspecified: Secondary | ICD-10-CM

## 2021-12-26 DIAGNOSIS — I442 Atrioventricular block, complete: Secondary | ICD-10-CM | POA: Diagnosis not present

## 2021-12-26 DIAGNOSIS — E785 Hyperlipidemia, unspecified: Secondary | ICD-10-CM | POA: Diagnosis present

## 2021-12-26 DIAGNOSIS — M6281 Muscle weakness (generalized): Secondary | ICD-10-CM | POA: Diagnosis not present

## 2021-12-26 DIAGNOSIS — E861 Hypovolemia: Secondary | ICD-10-CM | POA: Diagnosis present

## 2021-12-26 DIAGNOSIS — R2689 Other abnormalities of gait and mobility: Secondary | ICD-10-CM | POA: Diagnosis not present

## 2021-12-26 DIAGNOSIS — R531 Weakness: Secondary | ICD-10-CM | POA: Diagnosis not present

## 2021-12-26 DIAGNOSIS — Z8249 Family history of ischemic heart disease and other diseases of the circulatory system: Secondary | ICD-10-CM

## 2021-12-26 DIAGNOSIS — Z1612 Extended spectrum beta lactamase (ESBL) resistance: Secondary | ICD-10-CM | POA: Diagnosis present

## 2021-12-26 DIAGNOSIS — I4891 Unspecified atrial fibrillation: Secondary | ICD-10-CM | POA: Diagnosis not present

## 2021-12-26 DIAGNOSIS — Z7901 Long term (current) use of anticoagulants: Secondary | ICD-10-CM

## 2021-12-26 DIAGNOSIS — Z853 Personal history of malignant neoplasm of breast: Secondary | ICD-10-CM

## 2021-12-26 DIAGNOSIS — I5033 Acute on chronic diastolic (congestive) heart failure: Secondary | ICD-10-CM | POA: Diagnosis not present

## 2021-12-26 DIAGNOSIS — Z6830 Body mass index (BMI) 30.0-30.9, adult: Secondary | ICD-10-CM | POA: Diagnosis not present

## 2021-12-26 DIAGNOSIS — N390 Urinary tract infection, site not specified: Secondary | ICD-10-CM

## 2021-12-26 DIAGNOSIS — R1312 Dysphagia, oropharyngeal phase: Secondary | ICD-10-CM | POA: Diagnosis not present

## 2021-12-26 DIAGNOSIS — I951 Orthostatic hypotension: Secondary | ICD-10-CM | POA: Diagnosis present

## 2021-12-26 DIAGNOSIS — L539 Erythematous condition, unspecified: Secondary | ICD-10-CM | POA: Diagnosis not present

## 2021-12-26 DIAGNOSIS — A4150 Gram-negative sepsis, unspecified: Secondary | ICD-10-CM | POA: Diagnosis not present

## 2021-12-26 DIAGNOSIS — L03113 Cellulitis of right upper limb: Secondary | ICD-10-CM | POA: Diagnosis present

## 2021-12-26 DIAGNOSIS — I110 Hypertensive heart disease with heart failure: Secondary | ICD-10-CM | POA: Diagnosis present

## 2021-12-26 DIAGNOSIS — R319 Hematuria, unspecified: Secondary | ICD-10-CM | POA: Diagnosis present

## 2021-12-26 DIAGNOSIS — Z888 Allergy status to other drugs, medicaments and biological substances status: Secondary | ICD-10-CM

## 2021-12-26 DIAGNOSIS — N3000 Acute cystitis without hematuria: Secondary | ICD-10-CM | POA: Diagnosis not present

## 2021-12-26 DIAGNOSIS — E871 Hypo-osmolality and hyponatremia: Secondary | ICD-10-CM

## 2021-12-26 DIAGNOSIS — M170 Bilateral primary osteoarthritis of knee: Secondary | ICD-10-CM | POA: Diagnosis present

## 2021-12-26 DIAGNOSIS — Z9884 Bariatric surgery status: Secondary | ICD-10-CM

## 2021-12-26 DIAGNOSIS — Z85850 Personal history of malignant neoplasm of thyroid: Secondary | ICD-10-CM

## 2021-12-26 DIAGNOSIS — Z9181 History of falling: Secondary | ICD-10-CM | POA: Diagnosis not present

## 2021-12-26 DIAGNOSIS — S300XXD Contusion of lower back and pelvis, subsequent encounter: Secondary | ICD-10-CM | POA: Diagnosis not present

## 2021-12-26 DIAGNOSIS — Z8719 Personal history of other diseases of the digestive system: Secondary | ICD-10-CM

## 2021-12-26 DIAGNOSIS — I5032 Chronic diastolic (congestive) heart failure: Secondary | ICD-10-CM | POA: Diagnosis not present

## 2021-12-26 DIAGNOSIS — C8590 Non-Hodgkin lymphoma, unspecified, unspecified site: Secondary | ICD-10-CM | POA: Diagnosis not present

## 2021-12-26 DIAGNOSIS — N179 Acute kidney failure, unspecified: Secondary | ICD-10-CM

## 2021-12-26 DIAGNOSIS — Z91048 Other nonmedicinal substance allergy status: Secondary | ICD-10-CM

## 2021-12-26 DIAGNOSIS — M6259 Muscle wasting and atrophy, not elsewhere classified, multiple sites: Secondary | ICD-10-CM | POA: Diagnosis not present

## 2021-12-26 DIAGNOSIS — J90 Pleural effusion, not elsewhere classified: Secondary | ICD-10-CM

## 2021-12-26 DIAGNOSIS — I340 Nonrheumatic mitral (valve) insufficiency: Secondary | ICD-10-CM | POA: Diagnosis present

## 2021-12-26 DIAGNOSIS — E669 Obesity, unspecified: Secondary | ICD-10-CM | POA: Diagnosis present

## 2021-12-26 DIAGNOSIS — N3080 Other cystitis without hematuria: Secondary | ICD-10-CM | POA: Diagnosis not present

## 2021-12-26 DIAGNOSIS — Z85118 Personal history of other malignant neoplasm of bronchus and lung: Secondary | ICD-10-CM

## 2021-12-26 DIAGNOSIS — I482 Chronic atrial fibrillation, unspecified: Secondary | ICD-10-CM | POA: Diagnosis not present

## 2021-12-26 DIAGNOSIS — I4819 Other persistent atrial fibrillation: Secondary | ICD-10-CM | POA: Diagnosis present

## 2021-12-26 DIAGNOSIS — R41841 Cognitive communication deficit: Secondary | ICD-10-CM | POA: Diagnosis not present

## 2021-12-26 DIAGNOSIS — B9620 Unspecified Escherichia coli [E. coli] as the cause of diseases classified elsewhere: Secondary | ICD-10-CM | POA: Diagnosis present

## 2021-12-26 DIAGNOSIS — Z7401 Bed confinement status: Secondary | ICD-10-CM | POA: Diagnosis not present

## 2021-12-26 DIAGNOSIS — Z923 Personal history of irradiation: Secondary | ICD-10-CM

## 2021-12-26 DIAGNOSIS — I5031 Acute diastolic (congestive) heart failure: Secondary | ICD-10-CM | POA: Diagnosis not present

## 2021-12-26 DIAGNOSIS — Z79890 Hormone replacement therapy (postmenopausal): Secondary | ICD-10-CM

## 2021-12-26 DIAGNOSIS — R000 Tachycardia, unspecified: Secondary | ICD-10-CM | POA: Diagnosis present

## 2021-12-26 DIAGNOSIS — Z96651 Presence of right artificial knee joint: Secondary | ICD-10-CM | POA: Diagnosis present

## 2021-12-26 DIAGNOSIS — R4182 Altered mental status, unspecified: Secondary | ICD-10-CM | POA: Diagnosis not present

## 2021-12-26 DIAGNOSIS — Z79899 Other long term (current) drug therapy: Secondary | ICD-10-CM

## 2021-12-26 DIAGNOSIS — Z96611 Presence of right artificial shoulder joint: Secondary | ICD-10-CM | POA: Diagnosis present

## 2021-12-26 DIAGNOSIS — I484 Atypical atrial flutter: Secondary | ICD-10-CM | POA: Diagnosis present

## 2021-12-26 DIAGNOSIS — R197 Diarrhea, unspecified: Secondary | ICD-10-CM | POA: Diagnosis not present

## 2021-12-26 DIAGNOSIS — Z20822 Contact with and (suspected) exposure to covid-19: Secondary | ICD-10-CM | POA: Diagnosis not present

## 2021-12-26 DIAGNOSIS — R609 Edema, unspecified: Secondary | ICD-10-CM | POA: Diagnosis not present

## 2021-12-26 DIAGNOSIS — Z66 Do not resuscitate: Secondary | ICD-10-CM | POA: Diagnosis not present

## 2021-12-26 DIAGNOSIS — I272 Pulmonary hypertension, unspecified: Secondary | ICD-10-CM | POA: Diagnosis not present

## 2021-12-26 DIAGNOSIS — Z950 Presence of cardiac pacemaker: Secondary | ICD-10-CM

## 2021-12-26 DIAGNOSIS — G4733 Obstructive sleep apnea (adult) (pediatric): Secondary | ICD-10-CM | POA: Diagnosis not present

## 2021-12-26 DIAGNOSIS — Z885 Allergy status to narcotic agent status: Secondary | ICD-10-CM

## 2021-12-26 DIAGNOSIS — R262 Difficulty in walking, not elsewhere classified: Secondary | ICD-10-CM | POA: Diagnosis not present

## 2021-12-26 LAB — CBC WITH DIFFERENTIAL/PLATELET
Abs Immature Granulocytes: 0.06 10*3/uL (ref 0.00–0.07)
Basophils Absolute: 0 10*3/uL (ref 0.0–0.1)
Basophils Relative: 0 %
Eosinophils Absolute: 0 10*3/uL (ref 0.0–0.5)
Eosinophils Relative: 0 %
HCT: 40.8 % (ref 36.0–46.0)
Hemoglobin: 12.4 g/dL (ref 12.0–15.0)
Immature Granulocytes: 0 %
Lymphocytes Relative: 3 %
Lymphs Abs: 0.3 10*3/uL — ABNORMAL LOW (ref 0.7–4.0)
MCH: 32 pg (ref 26.0–34.0)
MCHC: 30.4 g/dL (ref 30.0–36.0)
MCV: 105.2 fL — ABNORMAL HIGH (ref 80.0–100.0)
Monocytes Absolute: 0.7 10*3/uL (ref 0.1–1.0)
Monocytes Relative: 5 %
Neutro Abs: 12.5 10*3/uL — ABNORMAL HIGH (ref 1.7–7.7)
Neutrophils Relative %: 92 %
Platelets: 212 10*3/uL (ref 150–400)
RBC: 3.88 MIL/uL (ref 3.87–5.11)
RDW: 18.9 % — ABNORMAL HIGH (ref 11.5–15.5)
WBC: 13.6 10*3/uL — ABNORMAL HIGH (ref 4.0–10.5)
nRBC: 0 % (ref 0.0–0.2)

## 2021-12-26 LAB — TSH: TSH: 12.373 u[IU]/mL — ABNORMAL HIGH (ref 0.350–4.500)

## 2021-12-26 LAB — COMPREHENSIVE METABOLIC PANEL
ALT: 25 U/L (ref 0–44)
AST: 29 U/L (ref 15–41)
Albumin: 2.8 g/dL — ABNORMAL LOW (ref 3.5–5.0)
Alkaline Phosphatase: 154 U/L — ABNORMAL HIGH (ref 38–126)
Anion gap: 7 (ref 5–15)
BUN: 10 mg/dL (ref 8–23)
CO2: 25 mmol/L (ref 22–32)
Calcium: 8.3 mg/dL — ABNORMAL LOW (ref 8.9–10.3)
Chloride: 99 mmol/L (ref 98–111)
Creatinine, Ser: 1.02 mg/dL — ABNORMAL HIGH (ref 0.44–1.00)
GFR, Estimated: 57 mL/min — ABNORMAL LOW (ref >60)
Glucose, Bld: 101 mg/dL — ABNORMAL HIGH (ref 70–99)
Potassium: 4.4 mmol/L (ref 3.5–5.1)
Sodium: 131 mmol/L — ABNORMAL LOW (ref 135–145)
Total Bilirubin: 1.3 mg/dL — ABNORMAL HIGH (ref 0.3–1.2)
Total Protein: 5.2 g/dL — ABNORMAL LOW (ref 6.5–8.1)

## 2021-12-26 LAB — URINALYSIS, ROUTINE W REFLEX MICROSCOPIC
Bilirubin Urine: NEGATIVE
Glucose, UA: NEGATIVE mg/dL
Ketones, ur: NEGATIVE mg/dL
Nitrite: NEGATIVE
Protein, ur: 30 mg/dL — AB
Specific Gravity, Urine: 1.025 (ref 1.005–1.030)
pH: 5 (ref 5.0–8.0)

## 2021-12-26 LAB — PROTIME-INR
INR: 2.4 — ABNORMAL HIGH (ref 0.8–1.2)
Prothrombin Time: 26 seconds — ABNORMAL HIGH (ref 11.4–15.2)

## 2021-12-26 LAB — SAMPLE TO BLOOD BANK

## 2021-12-26 LAB — APTT: aPTT: 43 seconds — ABNORMAL HIGH (ref 24–36)

## 2021-12-26 LAB — LACTIC ACID, PLASMA: Lactic Acid, Venous: 1.3 mmol/L (ref 0.5–1.9)

## 2021-12-26 MED ORDER — SODIUM CHLORIDE 0.9 % IV BOLUS (SEPSIS)
1000.0000 mL | Freq: Once | INTRAVENOUS | Status: AC
  Administered 2021-12-27: 1000 mL via INTRAVENOUS

## 2021-12-26 MED ORDER — MIDODRINE HCL 5 MG PO TABS
5.0000 mg | ORAL_TABLET | Freq: Two times a day (BID) | ORAL | Status: DC
Start: 2021-12-27 — End: 2021-12-29
  Administered 2021-12-27 – 2021-12-29 (×4): 5 mg via ORAL
  Filled 2021-12-26 (×4): qty 1

## 2021-12-26 MED ORDER — METOPROLOL SUCCINATE ER 25 MG PO TB24
25.0000 mg | ORAL_TABLET | Freq: Every day | ORAL | Status: DC
Start: 2021-12-27 — End: 2021-12-29
  Administered 2021-12-27 – 2021-12-28 (×2): 25 mg via ORAL
  Filled 2021-12-26 (×3): qty 1

## 2021-12-26 MED ORDER — LEVOTHYROXINE SODIUM 25 MCG PO TABS
125.0000 ug | ORAL_TABLET | Freq: Every day | ORAL | Status: DC
  Administered 2021-12-27 – 2022-01-02 (×7): 125 ug via ORAL
  Filled 2021-12-26 (×7): qty 1

## 2021-12-26 MED ORDER — PIPERACILLIN-TAZOBACTAM 3.375 G IVPB 30 MIN: 3.3750 g | Freq: Four times a day (QID) | INTRAVENOUS | Status: DC

## 2021-12-26 MED ORDER — SODIUM CHLORIDE 0.9 % IV SOLN
2.0000 g | Freq: Three times a day (TID) | INTRAVENOUS | Status: DC
  Administered 2021-12-26 – 2021-12-29 (×8): 2 g via INTRAVENOUS
  Filled 2021-12-26 (×11): qty 40

## 2021-12-26 MED ORDER — ROSUVASTATIN CALCIUM 5 MG PO TABS
10.0000 mg | ORAL_TABLET | ORAL | Status: DC
  Administered 2021-12-27 – 2021-12-31 (×2): 10 mg via ORAL
  Filled 2021-12-26 (×2): qty 2

## 2021-12-26 MED ORDER — SODIUM CHLORIDE 0.9 % IV BOLUS (SEPSIS)
1000.0000 mL | Freq: Once | INTRAVENOUS | Status: AC
  Administered 2021-12-26: 1000 mL via INTRAVENOUS

## 2021-12-26 MED ORDER — LIDOCAINE 5 % EX PTCH
2.0000 | MEDICATED_PATCH | CUTANEOUS | Status: DC
  Administered 2021-12-27 – 2021-12-28 (×2): 2 via TRANSDERMAL
  Filled 2021-12-26 (×5): qty 2

## 2021-12-26 MED ORDER — CYCLOSPORINE 0.05 % OP EMUL
1.0000 [drp] | Freq: Every day | OPHTHALMIC | Status: DC
  Administered 2021-12-28 – 2022-01-02 (×6): 1 [drp] via OPHTHALMIC
  Filled 2021-12-26 (×7): qty 30

## 2021-12-26 MED ORDER — APIXABAN 5 MG PO TABS
5.0000 mg | ORAL_TABLET | Freq: Two times a day (BID) | ORAL | Status: DC
  Administered 2021-12-27 – 2022-01-02 (×14): 5 mg via ORAL
  Filled 2021-12-26 (×14): qty 1

## 2021-12-26 MED ORDER — SODIUM CHLORIDE 0.9 % IV BOLUS (SEPSIS): 1000.0000 mL | Freq: Once | INTRAVENOUS | Status: DC

## 2021-12-26 MED ORDER — HYDROCODONE-ACETAMINOPHEN 5-325 MG PO TABS
1.0000 | ORAL_TABLET | Freq: Two times a day (BID) | ORAL | Status: DC | PRN
Start: 2021-12-26 — End: 2022-01-02

## 2021-12-26 MED ORDER — CALCIUM CARB-CHOLECALCIFEROL 600-5 MG-MCG PO TABS: ORAL_TABLET | Freq: Every day | ORAL | Status: DC

## 2021-12-26 MED ORDER — ENOXAPARIN SODIUM 40 MG/0.4ML IJ SOSY: 40.0000 mg | PREFILLED_SYRINGE | INTRAMUSCULAR | Status: DC

## 2021-12-26 MED ORDER — SODIUM CHLORIDE 0.9 % IV SOLN: INTRAVENOUS | Status: DC

## 2021-12-26 MED ORDER — MAGNESIUM OXIDE -MG SUPPLEMENT 400 (240 MG) MG PO TABS
400.0000 mg | ORAL_TABLET | Freq: Every day | ORAL | Status: DC
  Administered 2021-12-27 – 2022-01-02 (×8): 400 mg via ORAL
  Filled 2021-12-26 (×8): qty 1

## 2021-12-26 MED ORDER — OYSTER SHELL CALCIUM/D3 500-5 MG-MCG PO TABS
1.0000 | ORAL_TABLET | Freq: Every day | ORAL | Status: DC
  Administered 2021-12-28 – 2022-01-02 (×6): 1 via ORAL
  Filled 2021-12-26 (×8): qty 1

## 2021-12-26 MED ORDER — PROSOURCE PLUS PO LIQD
30.0000 mL | Freq: Two times a day (BID) | ORAL | Status: DC
  Administered 2021-12-27 – 2022-01-02 (×12): 30 mL via ORAL
  Filled 2021-12-26 (×11): qty 30

## 2021-12-26 MED ORDER — AMIODARONE HCL 200 MG PO TABS
200.0000 mg | ORAL_TABLET | Freq: Two times a day (BID) | ORAL | Status: DC
Start: 2021-12-26 — End: 2022-01-02
  Administered 2021-12-27 – 2022-01-02 (×14): 200 mg via ORAL
  Filled 2021-12-26 (×14): qty 1

## 2021-12-26 MED ORDER — ROSUVASTATIN CALCIUM 5 MG PO TABS
10.0000 mg | ORAL_TABLET | ORAL | Status: DC
Start: 2021-12-26 — End: 2021-12-26

## 2021-12-26 MED ORDER — MUSCLE RUB 10-15 % EX CREA
1.0000 "application " | TOPICAL_CREAM | Freq: Every day | CUTANEOUS | Status: DC
  Administered 2021-12-27 – 2022-01-02 (×7): 1 via TOPICAL
  Filled 2021-12-26: qty 85

## 2021-12-26 NOTE — Assessment & Plan Note (Signed)
-   We will continue amiodarone and Eliquis. 

## 2021-12-26 NOTE — Assessment & Plan Note (Signed)
-   This is mild and is likely related to mild volume depletion and dehydration from recent anorexia. ?- The patient will be hydrated with IV normal saline and will follow her BMP. ? ?

## 2021-12-26 NOTE — Assessment & Plan Note (Addendum)
-   We will continue Zoloft 

## 2021-12-26 NOTE — ED Notes (Signed)
Unable to find sufficient site for second blood culture. Phlebotomy notified to attempt while awaiting for abx to come from lab.  ?

## 2021-12-26 NOTE — ED Provider Notes (Signed)
?[facility] ?Provider Note ? ? ?CSN: [id number] ?Arrival date & time: 12/26/21  1602 ? ?  ? ?History ? ?Chief Complaint  ?Patient presents with  ? Failure To Thrive  ? ? ?Kayla Harrison is a 76 y.o. female. ? ?HPI ?Patient presents with generalized weakness.  Decreased oral intake.  Has been going for about 5 weeks.  Had a fall and had hematomas.  States she has had 3 blood transfusions since.  History of A-fib.  Is on anticoagulation.  States no blood in the stool.  No fevers or chills.  No coughing.  Is in chronic persistent A-fib with some tachycardia.  No chest pain or trouble breathing.  Just feels generally weak. ?  ? ?Home Medications ?Prior to Admission medications   ?Medication Sig Start Date End Date Taking? Authorizing Provider  ?acetaminophen (TYLENOL) 325 MG tablet Take 2 tablets (650 mg total) by mouth every 4 (four) hours as needed for mild pain (or temp > 37.5 C (99.5 F)). 04/02/21   Angiulli, Lavon Paganini, PA-C  ?Amino Acids-Protein Hydrolys (PRO-STAT SUGAR FREE PO) Take by mouth.    [provider]  ?amiodarone (PACERONE) 200 MG tablet Take 1 tablet (200 mg total) by mouth in the morning and at bedtime. 03/09/21   Hilty, Nadean Corwin, MD  ?apixaban (ELIQUIS) 5 MG TABS tablet TAKE 1 TABLET BY MOUTH  TWICE DAILY 10/22/21   Deboraha Sprang, MD  ?Calcium Carb-Cholecalciferol (CALCIUM + VITAMIN D3 PO) Take 1 tablet by mouth daily.    [provider]  ?HYDROcodone-acetaminophen (NORCO/VICODIN) 5-325 MG tablet Take 1 tablet by mouth 2 (two) times daily as needed for moderate pain.    [provider]  ?levothyroxine (SYNTHROID) 125 MCG tablet Take 1 tablet (125 mcg total) by mouth daily at 6 (six) AM. 11/21/21   Georgette Shell, MD  ?Lidocaine 4 % PTCH Apply 2 patches topically every other day.    [provider]  ?magnesium oxide (MAG-OX) 400 (240 Mg) MG tablet Take 1 tablet (400 mg total) by mouth daily. 11/21/21   Georgette Shell,  MD  ?Menthol, Topical Analgesic, (BIOFREEZE) 4 % GEL Apply 1 application. topically daily.    [provider]  ?metoprolol succinate (TOPROL-XL) 25 MG 24 hr tablet Take 25 mg by mouth daily. Take a half tablet twice a day    [provider]  ?midodrine (PROAMATINE) 5 MG tablet TAKE 1 TABLET BY MOUTH TWICE DAILY WITH A MEAL 08/28/21   Raulkar, Clide Deutscher, MD  ?nitrofurantoin, macrocrystal-monohydrate, (MACROBID) 100 MG capsule Take 1 capsule (100 mg total) by mouth every 12 (twelve) hours. ?Patient not taking: Reported on 12/11/2021 11/20/21   Georgette Shell, MD  ?polyethylene glycol (MIRALAX / GLYCOLAX) 17 g packet Take 17 g by mouth daily. ?Patient not taking: Reported on 12/11/2021 11/20/21   Georgette Shell, MD  ?PROBIOTIC, LACTOBACILLUS, PO Take by mouth.    [provider]  ?RESTASIS 0.05 % ophthalmic emulsion Place 1 drop into both eyes daily. 10/02/21   [provider]  ?rosuvastatin (CRESTOR) 10 MG tablet Take 1 tablet (10 mg total) by mouth daily. Mondays & Thursdays ?Patient taking differently: Take 10 mg by mouth See admin instructions. Mondays & Thursdays 03/19/21   Kathie Dike, MD  ?   ? ?Allergies    ?Ranolazine, Rivaroxaban, Tape, Tikosyn [dofetilide], Epinephrine, and Ketamine   ? ?Review of Systems   ?Review of Systems ? ?Physical Exam ?Updated Vital Signs ?BP  114/90   Pulse (!) 123   Temp 97.9 ?F (36.6 ?C) (Oral)   Resp (!) 24   SpO2 93%  ?Physical Exam ?Vitals and nursing note reviewed.  ?HENT:  ?   Head: Normocephalic.  ?Eyes:  ?   Pupils: Pupils are equal, round, and reactive to light.  ?Cardiovascular:  ?   Rate and Rhythm: Tachycardia present. Rhythm irregular.  ?Abdominal:  ?   Tenderness: There is no abdominal tenderness.  ?Musculoskeletal:  ?   Cervical back: Neck supple.  ?   Left lower leg: Edema present.  ?Skin: ?   Capillary Refill: Capillary refill takes less than 2 seconds.  ?Neurological:  ?   Mental Status: She is alert and oriented to  person, place, and time.  ? ? ?ED Results / Procedures / Treatments   ?Labs ?(all labs ordered are listed, but only abnormal results are displayed) ?Labs Reviewed  ?COMPREHENSIVE METABOLIC PANEL - Abnormal; Notable for the following components:  ?    Result Value  ? Sodium 131 (*)   ? Glucose, Bld 101 (*)   ? Creatinine, Ser 1.02 (*)   ? Calcium 8.3 (*)   ? Total Protein 5.2 (*)   ? Albumin 2.8 (*)   ? Alkaline Phosphatase 154 (*)   ? Total Bilirubin 1.3 (*)   ? GFR, Estimated 57 (*)   ? All other components within normal limits  ?URINALYSIS, ROUTINE W REFLEX MICROSCOPIC - Abnormal; Notable for the following components:  ? Color, Urine AMBER (*)   ? APPearance HAZY (*)   ? Hgb urine dipstick LARGE (*)   ? Protein, ur 30 (*)   ? Leukocytes,Ua TRACE (*)   ? Bacteria, UA RARE (*)   ? Non Squamous Epithelial 0-5 (*)   ? All other components within normal limits  ?CBC WITH DIFFERENTIAL/PLATELET - Abnormal; Notable for the following components:  ? WBC 13.6 (*)   ? MCV 105.2 (*)   ? RDW 18.9 (*)   ? Neutro Abs 12.5 (*)   ? Lymphs Abs 0.3 (*)   ? All other components within normal limits  ?URINE CULTURE  ?TSH  ?SAMPLE TO BLOOD BANK  ? ? ?EKG ?EKG Interpretation ? ?Date/Time:  Wednesday December 26 2021 16:24:17 EDT ?Ventricular Rate:  120 ?PR Interval:    ?QRS Duration: 140 ?QT Interval:  400 ?QTC Calculation: 566 ?R Axis:   -5 ?Text Interpretation: Afib Left bundle branch block Confirmed by Davonna Belling [phone]) on 12/26/2021 5:22:44 PM ? ?Radiology ?DG Chest Portable 1 View ? ?Result Date: 12/26/2021 ?CLINICAL DATA:  Weakness.  Failure to thrive. EXAM: PORTABLE CHEST 1 VIEW COMPARISON:  Most recent radiograph 11/18/2021.  CT 11/14/2018 FINDINGS: Left-sided pacemaker in place with intact leads. Prior left atrial clipping. Stable heart size and mediastinal contours. Hazy opacity throughout the right hemithorax consistent with layering pleural effusion, slight increase from prior exam possible small left pleural effusion.  Patchy left lung base atelectasis. Linear opacity in the right mid lung corresponds to fissural thickening on CT. No pneumothorax. Reverse right shoulder arthroplasty. IMPRESSION: Hazy opacity throughout the right hemithorax consistent with layering pleural effusion, slight increase from prior exam. Possible small left pleural effusion. Electronically Signed   By: Keith Rake M.D.   On: 12/26/2021 16:42   ? ?Procedures ?Procedures  ? ? ?Medications Ordered in ED ?Medications - No data to display ? ?ED Course/ Medical Decision Making/ A&P ?  ?                        ?  Medical Decision Making ?Amount and/or Complexity of Data Reviewed ?Labs: ordered. ?Radiology: ordered. ? ? ?Patient presents with generalized weakness.  Decreased oral intake.  Worsening over the last month.  Has reportedly been transfused 3 times for anemia.  However hemoglobin today is reassuring.  White count is somewhat elevated.  Chest x-ray independently interpreted and showed right-sided pleural effusion.  Creatinine mildly above baseline.  However urine shows likely infection.  EKG shows atrial fibrillation which is chronic.  However rate is a little faster than it had been.  Appears to run chronically over 100 bpm.  Urine shows likely infection.  With recent admission the hospital for UTI had been on Macrobid.  Had a ESBL E. coli.  Discussed with pharmacist and has different options including Zosyn.  I think patient benefit from admission to the hospital with the tachycardia decreased oral intake General weakness and UTI with resistant organism.  Will discuss with hospitalist for admission. ? ? ? ? ? ? ? ?Final Clinical Impression(s) / ED Diagnoses ?Final diagnoses:  ?Urinary tract infection without hematuria, site unspecified  ? ? ?Rx / DC Orders ?ED Discharge Orders   ? ? None  ? ?  ? ? ?  ?Davonna Belling, MD ?12/26/21 1901 ? ?

## 2021-12-26 NOTE — Progress Notes (Signed)
Sepsis tracking by eLINK 

## 2021-12-26 NOTE — Assessment & Plan Note (Signed)
-   This is chronic and even with mild worsening and she has not been having any worsening respiratory symptoms. ?

## 2021-12-26 NOTE — Assessment & Plan Note (Signed)
-   This is likely hypovolemic from volume depletion and dehydration. ?- The patient will be hydrated with IV normal saline and will follow BMP. ?

## 2021-12-26 NOTE — ED Notes (Signed)
EDP aware of pt's vitals. No new orders received.  ?

## 2021-12-26 NOTE — Assessment & Plan Note (Signed)
-   She is status post chemotherapy which she finished a year ago. ?

## 2021-12-26 NOTE — ED Triage Notes (Signed)
Pt BIB EMS from nursing facility c/o being very weak, not getting up with decreased po intake for the last month. Had 3 blood transfusion for chronic anemia within last month. A&O X4. VSS. Hx of AFIB.  ?Pt's Baseline - A&O X4. Ambulatory with a walker. ?

## 2021-12-26 NOTE — ED Notes (Signed)
Notified Mansy, MD of pt's  vital signs and requested sepsis orderset. See new order.  ?

## 2021-12-26 NOTE — Assessment & Plan Note (Addendum)
-   We will continue Synthroid. ?- We will check her TSH especially given abnormal recent weight loss. ?

## 2021-12-26 NOTE — Assessment & Plan Note (Addendum)
-   The patient meets sepsis criteria by leukocytosis, tachycardia and tachypnea. ?-She will be admitted to the medical monitored bed. ?- We will continue antibiotic therapy with IV Meropenem given her history of ESBL pending urine culture and sensitivity. ?- We will follow blood cultures ?- She will be hydrated with IV normal saline. ?

## 2021-12-26 NOTE — H&P (Addendum)
?  ?  ?[place] ? ? ?PATIENT NAME: Kayla Harrison   ? ?MR#:  [id number] ? ?DATE OF BIRTH:  [birth year] ? ?DATE OF ADMISSION:  12/26/2021 ? ?PRIMARY CARE PHYSICIAN: [name], MD  ? ?Patient is coming from: Home ? ?REQUESTING/REFERRING PHYSICIAN: Davonna Belling, MD ? ?CHIEF COMPLAINT:  ? ?Chief Complaint  ?Patient presents with  ? Failure To Thrive  ? ? ?HISTORY OF PRESENT ILLNESS:  ?Kayla Harrison is a 76 y.o. Caucasian female with medical history significant for hypothyroidism, dyslipidemia, hypertension, non-Hodgkin's lymphoma, chronic atrial fibrillation on Eliquis and  history of ESBL UTI who presented to the ER with acute onset of generalized weakness and failure to thrive with diminished p.o. intake.  This has been deteriorating over the last 3 weeks.  She had a fall and hematomas.  She had 3 blood transfusions since.  She tells me she has been having urinary frequency and urgency with occasional hematuria.  She denies any cough or wheezing or dyspnea.  No chest pain or palpitations.  No fever or chills.  No nausea or vomiting or abdominal pain.  She has bilateral upper and lower extremity edema.  She stated that she lost 45 pounds in the last 4 months. ? ?ED Course: When she came to the ER heart rate was 123 with a respiratory rate of 24 and otherwise normal vital signs ?EKG as reviewed by me : EKG showed atrial fibrillation with a rate of 120 with left bundle branch block. ?Imaging: Chest x-ray showed hazy opacity throughout the right hemithorax consistent with layering pleural effusion, slightly increased from prior exam with possible small left pleural effusion. ? ?The patient will be admitted to a medical telemetry bed for further evaluation and management. ?PAST MEDICAL HISTORY:  ? ?Past Medical History:  ?Diagnosis Date  ? Anemia   ? Arthritis   ? osteoarthritis - knees and right shoulder  ? Blood transfusion without reported diagnosis   ? Breast cancer ([place])   ? Dr Margot Chimes, total  thyroidectomy- 1999- for cancer  ? Brucellosis 1964  ? Chronic bilateral pleural effusions   ? Colon polyp   ? Dr Earlean Shawl  ? Complication of anesthesia   ? Ketamine produces LSD reaction, bright colored nightmarish experience   ? Dyslipidemia   ? Endometriosis   ? Fibroids   ? H/O pleural effusion   ? s/p thoracentesis w 3252m withdrawn  ? Hematoma 11/12/2021  ? Hepatitis   ? [place] ?hepatitis   ? History of dysphagia   ? due to radiation therapy  ? History of hiatal hernia   ? small noted on PET scan  ? Hypertension   ? Hypothyroidism   ? [place]  ? radiation RX completed 03/04/17; will start chemo 6/27, pt unaware of lung cancer  ? [place]   ? Status post lap band surgery  ? Nephrolithiasis   ? [place] Hodgkin's lymphoma ([facility]   ? on chemotherapy  ? Persistent atrial fibrillation ([facility] [place]   ? a. s/p PVI 2008 b. s/p convergent ablation 24656complicated by bradycardia requiring pacemaker implant  ? Personal history of radiation therapy   ? Presence of permanent cardiac pacemaker   ? Rotator cuff tear   ? Right  ? [place]   ? [place]  ? SVC syndrome   ? with lung mass and non hodgkins lymphoma  ? [place] [place] [place]  ? ? ?PAST SURGICAL  HISTORY:  ? ?Past Surgical History:  ?Procedure Laterality Date  ? ABDOMINAL HYSTERECTOMY  1983  ? afib ablation    ? a. 2008 PVI b. 2014 convergent ablation  ? APPENDECTOMY    ? BONE MARROW BIOPSY  02/21/2017  ? BREAST LUMPECTOMY Left 2010  ? bso  1998  ? CARDIAC CATHETERIZATION    ? 2015- negative  ? CARDIOVERSION  10/09/2012  ? Procedure: CARDIOVERSION;  Surgeon: Minus Breeding, MD;  Location: [place];  Service: Cardiovascular;  Laterality: N/A;  ? CARDIOVERSION  10/09/2012  ? Procedure: CARDIOVERSION;  Surgeon: Minus Breeding, MD;  Location: [facility] ENDOSCOPY;  Service: Cardiovascular;  Laterality: N/A;  [name] gave the ok to add pt to the add on , but we must check to find out if the can add pt on at  [phone] ( [phone]  ? CARDIOVERSION N/A 11/20/2012  ? Procedure: CARDIOVERSION;  Surgeon: Fay Records, MD;  Location: [place];  Service: Cardiovascular;  Laterality: N/A;  ? CARDIOVERSION N/A 07/18/2017  ? Procedure: CARDIOVERSION;  Surgeon: Thayer Headings, MD;  Location: [place];  Service: Cardiovascular;  Laterality: N/A;  ? CARDIOVERSION N/A 10/03/2017  ? Procedure: CARDIOVERSION;  Surgeon: Sanda Klein, MD;  Location: [place];  Service: Cardiovascular;  Laterality: N/A;  ? CARDIOVERSION N/A 01/07/2018  ? Procedure: CARDIOVERSION;  Surgeon: Thayer Headings, MD;  Location: [place];  Service: Cardiovascular;  Laterality: N/A;  ? CARDIOVERSION N/A 12/10/2019  ? Procedure: CARDIOVERSION;  Surgeon: Buford Dresser, MD;  Location: [place];  Service: Cardiovascular;  Laterality: N/A;  ? CARDIOVERSION N/A 03/09/2021  ? Procedure: CARDIOVERSION;  Surgeon: Pixie Casino, MD;  Location: [place];  Service: Cardiovascular;  Laterality: N/A;  ? CARDIOVERSION N/A 10/04/2021  ? Procedure: CARDIOVERSION;  Surgeon: Jerline Pain, MD;  Location: [facility] ENDOSCOPY;  Service: Cardiovascular;  Laterality: N/A;  ? CHOLECYSTECTOMY    ? COLONOSCOPY W/ POLYPECTOMY    ? Dr Earlean Shawl  ? CYSTOSCOPY N/A 02/06/2015  ? Procedure: CYSTOSCOPY;  Surgeon: Kathie Rhodes, MD;  Location[facility];  Service: Urology;  Laterality: N/A;  ? CYSTOSCOPY W/ RETROGRADES Left 11/17/2017  ? Procedure: CYSTOSCOPY WITH RETROGRADE /PYELOGRAM/;  Surgeon: Kathie Rhodes, MD;  Location: [facility];  Service: Urology;  Laterality: Left;  ? CYSTOSCOPY WITH RETROGRADE PYELOGRAM, URETEROSCOPY AND STENT PLACEMENT Right 02/06/2015  ? Procedure: RETROGRADE PYELOGRAM, RIGHT URETEROSCOPY STENT PLACEMENT;  Surgeon: Kathie Rhodes, MD;  Location[facility];  Service: Urology;  Laterality: Right;  ? CYSTOSCOPY WITH RETROGRADE PYELOGRAM, URETEROSCOPY AND STENT PLACEMENT Right 03/07/2017  ? Procedure: CYSTOSCOPY WITH RIGHT RETROGRADE PYELOGRAM,RIGHT  URETEROSCOPYLASER  LITHOTRIPSY  AND STENT PLACEMENT AND STONE BASKETRY;  Surgeon: Kathie Rhodes, MD;  Location: [place];  Service: Urology;  Laterality: Right;  ? EYE SURGERY    ? cataract surgery  ? fatty mass removal  1999  ? pubic area  ? HOLMIUM LASER APPLICATION N/A 2/29/7989  ? Procedure: HOLMIUM LASER APPLICATION;  Surgeon: Kathie Rhodes, MD;  Location[facility];  Service: Urology;  Laterality: N/A;  ? HOLMIUM LASER APPLICATION Right 11/04/9415  ? Procedure: HOLMIUM LASER APPLICATION;  Surgeon: Kathie Rhodes, MD;  Location: [facility];  Service: Urology;  Laterality: Right;  ? HOLMIUM LASER APPLICATION Left 12/30/1446  ? Procedure: HOLMIUM LASER APPLICATION;  Surgeon: Kathie Rhodes, MD;  [facility];  Service: Urology;  Laterality: Left;  ? I & D EXTREMITY Left 12/19/2020  ? Procedure: IRRIGATION AND DEBRIDEMENT OF LEFT HIP HEMATOMA WITH APPLICATION OF WOUND VAC;  Surgeon: Erle Crocker, MD;  Location: [place];  Service: Orthopedics;  Laterality: Left;  ? I & D EXTREMITY Left 02/06/2021  ? Procedure: IRRIGATION AND DEBRIDEMENT DEEP ABCESS LEFT THIGH, SECONDARY CLOSURE OF WOUND DEHISCENCE;  Surgeon: Erle Crocker, MD;  Location: [place];  Service: Orthopedics;  Laterality: Left;  ? IR FLUORO GUIDE PORT INSERTION RIGHT  02/24/2017  ? IR NEPHROSTOMY PLACEMENT RIGHT  11/17/2017  ? IR PATIENT EVAL TECH 0-60 MINS  03/11/2017  ? IR REMOVAL TUN ACCESS W/ PORT W/O FL MOD SED  04/20/2018  ? IR US GUIDE VASC ACCESS RIGHT  02/24/2017  ? KNEE ARTHROSCOPY    ? bilateral  ? LAPAROSCOPIC GASTRIC BANDING  07/10/2010  ? LAPAROSCOPIC GASTRIC BANDING    ? Laparoscopic adjustable banding APS System with posterior hiatal hernia, 2 suture.  ? LAPAROTOMY    ? for ruptured ovary and ovarian artery   ? NEPHROLITHOTOMY Right 11/17/2017  ? Procedure: NEPHROLITHOTOMY PERCUTANEOUS;  Surgeon: Kathie Rhodes, MD;  [facility];  Service: Urology;  Laterality: Right;  ? PACEMAKER INSERTION  03/10/2013  ? MDT dual chamber  PPM  ? POCKET REVISION N/A 12/08/2013  ? Procedure: POCKET REVISION;  Surgeon: Deboraha Sprang, MD;  Location: [facility] CATH LAB;  Service: Cardiovascular;  Laterality: N/A;  ? PORTA CATH INSERTION    ? REVERSE SHOULD

## 2021-12-26 NOTE — Assessment & Plan Note (Signed)
-   We will continue statin therapy. 

## 2021-12-27 ENCOUNTER — Ambulatory Visit: Payer: Medicare Other | Admitting: Neurology

## 2021-12-27 ENCOUNTER — Inpatient Hospital Stay (HOSPITAL_COMMUNITY): Payer: Medicare Other

## 2021-12-27 ENCOUNTER — Encounter (HOSPITAL_COMMUNITY): Payer: Self-pay | Admitting: Family Medicine

## 2021-12-27 ENCOUNTER — Inpatient Hospital Stay: Payer: Self-pay

## 2021-12-27 DIAGNOSIS — N3000 Acute cystitis without hematuria: Secondary | ICD-10-CM

## 2021-12-27 DIAGNOSIS — R609 Edema, unspecified: Secondary | ICD-10-CM | POA: Diagnosis not present

## 2021-12-27 DIAGNOSIS — L539 Erythematous condition, unspecified: Secondary | ICD-10-CM

## 2021-12-27 LAB — CBC
HCT: 37.5 % (ref 36.0–46.0)
Hemoglobin: 11.4 g/dL — ABNORMAL LOW (ref 12.0–15.0)
MCH: 32.1 pg (ref 26.0–34.0)
MCHC: 30.4 g/dL (ref 30.0–36.0)
MCV: 105.6 fL — ABNORMAL HIGH (ref 80.0–100.0)
Platelets: 178 10*3/uL (ref 150–400)
RBC: 3.55 MIL/uL — ABNORMAL LOW (ref 3.87–5.11)
RDW: 19 % — ABNORMAL HIGH (ref 11.5–15.5)
WBC: 12.6 10*3/uL — ABNORMAL HIGH (ref 4.0–10.5)
nRBC: 0 % (ref 0.0–0.2)

## 2021-12-27 LAB — BASIC METABOLIC PANEL
Anion gap: 5 (ref 5–15)
BUN: 10 mg/dL (ref 8–23)
CO2: 20 mmol/L — ABNORMAL LOW (ref 22–32)
Calcium: 7.6 mg/dL — ABNORMAL LOW (ref 8.9–10.3)
Chloride: 107 mmol/L (ref 98–111)
Creatinine, Ser: 0.96 mg/dL (ref 0.44–1.00)
GFR, Estimated: >60 mL/min (ref >60)
Glucose, Bld: 89 mg/dL (ref 70–99)
Potassium: 4.6 mmol/L (ref 3.5–5.1)
Sodium: 132 mmol/L — ABNORMAL LOW (ref 135–145)

## 2021-12-27 MED ORDER — DILTIAZEM HCL-DEXTROSE 125-5 MG/125ML-% IV SOLN (PREMIX)
5.0000 mg/h | INTRAVENOUS | Status: DC
  Administered 2021-12-28: 5 mg/h via INTRAVENOUS
  Filled 2021-12-27: qty 125

## 2021-12-27 NOTE — Progress Notes (Addendum)
HOSPITAL MEDICINE OVERNIGHT EVENT NOTE   ? ?Notified by PICC RN patient is not a good candidate for a midline.  They are recommending PICC line placement. ? ?Order placed for PICC line which should be performed this evening. ? ?Vernelle Emerald  MD ?[facility]  ? ?ADDENDUM 9:20pm 4/6 ? ?After a follow-up discussion with the IV team, patient is not a candidate for PICC line placement after all. ? ?Patient still requires intravenous meropenem for ESBL UTI and potentially will require reinitiation on a diltiazem infusion depending on patient's rate control. ? ?I therefore discussed case with PA Hoffman with PCCM who will try to go by and assess the patient to see if a central line is appropriate. ? ?Sherryll Burger Ivin Rosenbloom ? ?ADDENDUM 4/6 11:30pm ? ?PA Georgann Housekeeper with PCCM evaluated the patient and fortunately was able to place an EJ and not have to resort to central line placement.  Their assistance is appreciated. ? ?Sherryll Burger Sherrie Marsan ? ? ? ? ? ? ? ? ? ? ? ? ?

## 2021-12-27 NOTE — ED Notes (Signed)
Per IV team at bedside they will have to place another consult for another team member to place midline as patient is a very difficult stick. This RN made Jeani Sow RN aware of same. Will transport patient upstairs at this time.  ?

## 2021-12-27 NOTE — Progress Notes (Signed)
This RN at bedside to assess for PICC placement. Limb restriction is to RIGHT arm, and patient has left sided pacemaker so unable to place PICC at this time without cardiology involvement and approval. ? ?Second assessment of left arm using ultrasound performed. Left forearm found to be infiltrated, no vein for PIV. No left side cephalic vein visualized at all. Left side basilic too small for PICC placement. Left side brachial only viable vein for PICC if approval received. Would recommend temporary CVC at this time. ? ?Dr. Cyd Silence made aware.  ?

## 2021-12-27 NOTE — Progress Notes (Signed)
Right arm has cellulitis. Assessed left arm. Arm is very edematous with some infiltration from [facility] PIV. FA had no appropriate vasculature for PIV. Possible availability in Left Brachial and Basilic. 2nd assess from IV team needed. ?

## 2021-12-27 NOTE — ED Notes (Addendum)
This RN noticed pt's R forearm to be swollen, edematous, and red. Pt was asked about it and pt states she noticed it yesterday but never told anyone. MD Shalhoub notifed. ?

## 2021-12-27 NOTE — ED Notes (Signed)
Report given to 5W RN Carl Best via secure chat. Per Jenny Reichmann we are not to bring patient up until PIV is placed. This RN let patient know of patient's edematous extremities and also the effort that this RN made in attempting to place another IV. IV team consult has been placed and per ED Charge Thurmond Butts it is ok to send up patient at this time.  ?

## 2021-12-27 NOTE — Progress Notes (Signed)
Contacted by Dr. Cyd Silence regarding lack of IV access.  ? ?#20 gauge angiocath placed into the left external jugular vein.  ? ? ?Georgann Housekeeper, AGACNP-BC ?[facility] ? ?See Amion for personal pager ?PCCM on call pager [phone] until 7pm. ?Please call Elink 7p-7a. [phone] ? ?12/27/2021 11:39 PM ? ? ? ? ?

## 2021-12-27 NOTE — Progress Notes (Signed)
Discussed with previous IV Team RN Kristin Bruins that patient has limited veins for access, and initially considered midline. Patient now has IV antibiotics and diltiazem infusion pending, which is not suitable for midline. At time time, would recommend patient have central access given limb restriction, poor veins, and need for vesicant infusion.  ? ?Discussed with primary RN who will request PICC order from MD. Will continue to follow for PIV vs. PICC placement.  ?

## 2021-12-27 NOTE — ED Notes (Signed)
IV team at bedside at this time to place PIV for patient.  ?

## 2021-12-27 NOTE — Progress Notes (Signed)
Right upper extremity venous duplex completed. ?Refer to "CV Proc" under chart review to view preliminary results. ? ?12/27/2021 2:49 PM ?Kelby Aline., MHA, RVT, RDCS, RDMS   ?

## 2021-12-27 NOTE — Progress Notes (Signed)
?PROGRESS NOTE ? ?Kayla Harrison  DJT:[id number] DOB: November 22, 1945 DOA: 12/26/2021 ?PCP: [name], MD  ? ?Brief Narrative: ? ?Patient is a 76 year old female with history of hypothyroidism, hyperlipidemia, hypertension, non Hodgkins lymphoma, chronic A-fib on Eliquis, ESBL UTI who presented from [place] with complaint of generalized weakness, decreased oral intake, fall , increased urinary frequency, urgency.  There was also report of 45 pounds weight loss in last 4 months.  On presentation, she was in A-fib with RVR.  Chest x-ray showed hazy opacity throughout the right thorax consistent with layering pleural effusion slightly increased from prior.  UA was suspicious for UTI.  Urine culture, blood culture sent.  Patient was admitted for the management of sepsis secondary to UTI. ? ?Assessment & Plan: ? ?Principal Problem: ?  UTI (urinary tract infection) ?Active Problems: ?  Sepsis due to gram-negative UTI ([place]) ?  AKI (acute kidney injury) ([place]) ?  Chronic atrial fibrillation with RVR ([facility]) ?  Hypothyroidism ?  Hyponatremia ?  Dyslipidemia ?  Depression ?  Pleural effusion on right ?  Non Hodgkin's lymphoma ([place]) ? ?Sepsis secondary to UTI: Patient has history of ESBL UTI.  Presented with increased urgency, dysuria.  Lab work showed leukocytosis.  Started on meropenem due to history of ESBL UTI.  Follow-up urine culture, blood culture.  Started on gentle IV fluids ? ?AKI: Resolved with IV fluids ? ?Chronic A-fib: Presented with RVR.  On amiodarone and Eliquis for anticoagulation.  Monitor on telemetry.  On rate control with metoprolol.  Heart rate ranging from 100-120.  Continue to monitor.  If she remains in rapid A-fib, may need to titrate the dose of metoprolol or add Cardizem ? ?Hyponatremia: Suspect to be from hypovolemia from volume depletion, dehydration.  On gentle IV fluids ? ?Hypothyroidism: Takes Synthyroid.  Elevated TSH of 12.37.  Checking free T4 ? ?Hyperlipidemia: On statin ? ?Depression: On  Zoloft ? ?Chronic right pleural effusion: No respiratory symptoms.  Continue to monitor ? ?History of non-Hodgkin's lymphoma: Status postchemotherapy. ? ?Edema/erythema of right forearm: We will check venous Doppler.  Looks like cellulitis.  Continue antibiotics ? ?Weakness/deconditioning: We requested  for PT/OT evaluation.  Complains of severe weakness.  Currently from [facility].  TOC consulted ? ? ?  ? ? ?  ?  ? ?DVT prophylaxis: ?apixaban (ELIQUIS) tablet 5 mg  ? ?  Code Status: DNR ? ?Family Communication: Sister at bedside ? ?Patient status: Inpatient ? ?Patient is from : Skilled nursing facility ? ?Anticipated discharge to: Skilled nursing facility ? ?Estimated DC date: 2 to 3 days ? ? ?Consultants: None ? ? ?Procedures: None ? ?Antimicrobials:  ?Anti-infectives (From admission, onward)  ? ? Start     Dose/Rate Route Frequency Ordered Stop  ? 12/26/21 2100  meropenem (MERREM) 2 g in sodium chloride 0.9 % 100 mL IVPB       ? 2 g ?280 mL/hr over 30 Minutes Intravenous Every 8 hours 12/26/21 2038    ? 12/26/21 1945  piperacillin-tazobactam (ZOSYN) IVPB 3.375 g  Status:  Discontinued       ? 3.375 g ?100 mL/hr over 30 Minutes Intravenous Every 6 hours 12/26/21 1944 12/26/21 2038  ? ?  ? ? ?Subjective: ? ?Patient seen and examined at the bedside this morning.  Very deconditioned, weak, lying in bed but alert or oriented.  Any shortness of breath or cough.  Heart rate ranging from 100-120.  Her main concern was inability to walk ? ?Objective: ?Vitals:  ? 12/27/21 0700  12/27/21 0715 12/27/21 0730 12/27/21 0745  ?BP: 116/87 94/82 120/84 112/82  ?Pulse: (!) 119 (!) 117 (!) 117 (!) 117  ?Resp: (!) 21 19 (!) 21 20  ?Temp:      ?TempSrc:      ?SpO2: 100% 90% 95% 92%  ?Weight:      ?Height:      ? ? ?Intake/Output Summary (Last 24 hours) at 12/27/2021 0803 ?Last data filed at 12/27/2021 0753 ?Gross per 24 hour  ?Intake 3200 ml  ?Output --  ?Net 3200 ml  ? ?Filed Weights  ? 12/26/21 2008  ?Weight: 85 kg   ? ? ?Examination: ? ?General exam: Very deconditioned, weak, chronically ill looking ?HEENT: PERRL ?Respiratory system:  no wheezes or crackles  ?Cardiovascular system: Irregularly irregular rhythm ?Gastrointestinal system: Abdomen is nondistended, soft and nontender. ?Central nervous system: Alert and oriented ?Extremities: Trace bilateral lower extremity edema, no clubbing ,no cyanosis, erythematous/edematous right forearm ?Skin: No rashes, no ulcers,no icterus   ? ? ?Data Reviewed: I have personally reviewed following labs and imaging studies ? ?CBC: ?Recent Labs  ?Lab 12/26/21 ?1637 12/27/21 ?1027  ?WBC 13.6* 12.6*  ?NEUTROABS 12.5*  --   ?HGB 12.4 11.4*  ?HCT 40.8 37.5  ?MCV 105.2* 105.6*  ?PLT 212 178  ? ?Basic Metabolic Panel: ?Recent Labs  ?Lab 12/26/21 ?1637 12/27/21 ?2536  ?NA 131* 132*  ?K 4.4 4.6  ?CL 99 107  ?CO2 25 20*  ?GLUCOSE 101* 89  ?BUN 10 10  ?CREATININE 1.02* 0.96  ?CALCIUM 8.3* 7.6*  ? ? ? ?Recent Results (from the past 240 hour(s))  ?Culture, blood (x 2)     Status: None (Preliminary result)  ? Collection Time: 12/26/21  8:27 PM  ? Specimen: BLOOD  ?Result Value Ref Range Status  ? Specimen Description BLOOD RIGHT ANTECUBITAL  Final  ? Special Requests   Final  ?  BOTTLES DRAWN AEROBIC AND ANAEROBIC Blood Culture results may not be optimal due to an excessive volume of blood received in culture bottles  ? Culture   Final  ?  NO GROWTH < 12 HOURS ?Performed at [facility], [place] [address] [place], [place] [address] ?  ? Report Status PENDING  Incomplete  ?Culture, blood (x 2)     Status: None (Preliminary result)  ? Collection Time: 12/26/21  9:24 PM  ? Specimen: BLOOD  ?Result Value Ref Range Status  ? Specimen Description BLOOD BLOOD LEFT HAND  Final  ? Special Requests   Final  ?  BOTTLES DRAWN AEROBIC ONLY Blood Culture results may not be optimal due to an inadequate volume of blood received in culture bottles  ? Culture   Final  ?  NO GROWTH < 12 HOURS ?Performed at [facility], [place] [address], [place], [place] [address] ?  ? Report Status PENDING  Incomplete  ?  ? ?Radiology Studies: ?DG Chest Portable 1 View ? ?Result Date: 12/26/2021 ?CLINICAL DATA:  Weakness.  Failure to thrive. EXAM: PORTABLE CHEST 1 VIEW COMPARISON:  Most recent radiograph 11/18/2021.  CT 11/14/2018 FINDINGS: Left-sided pacemaker in place with intact leads. Prior left atrial clipping. Stable heart size and mediastinal contours. Hazy opacity throughout the right hemithorax consistent with layering pleural effusion, slight increase from prior exam possible small left pleural effusion. Patchy left lung base atelectasis. Linear opacity in the right mid lung corresponds to fissural thickening on CT. No pneumothorax. Reverse right shoulder arthroplasty. IMPRESSION: Hazy opacity throughout the right hemithorax consistent with layering  pleural effusion, slight increase from prior exam. Possible small left pleural effusion. Electronically Signed   By: Keith Rake M.D.   On: 12/26/2021 16:42   ? ?Scheduled Meds: ? (feeding supplement) PROSource Plus  30 mL Oral BID BM  ? amiodarone  200 mg Oral BID  ? apixaban  5 mg Oral BID  ? calcium-vitamin D  1 tablet Oral Q breakfast  ? cycloSPORINE  1 drop Both Eyes Daily  ? levothyroxine  125 mcg Oral Q0600  ? lidocaine  2 patch Transdermal QODAY  ? magnesium oxide  400 mg Oral Daily  ? metoprolol succinate  25 mg Oral Daily  ? midodrine  5 mg Oral BID WC  ? Muscle Rub  1 application. Topical Daily  ? rosuvastatin  10 mg Oral Once per day on Mon Thu  ? ?Continuous Infusions: ? sodium chloride 100 mL/hr at 12/27/21 0012  ? meropenem (MERREM) IV Stopped (12/27/21 0753)  ? ? ? LOS: 1 day  ? ?Shelly Coss, MD ?[facility] ?P4/02/2022, 8:03 AM   ?

## 2021-12-28 ENCOUNTER — Other Ambulatory Visit: Payer: Self-pay

## 2021-12-28 DIAGNOSIS — N3000 Acute cystitis without hematuria: Secondary | ICD-10-CM | POA: Diagnosis not present

## 2021-12-28 LAB — CBC WITH DIFFERENTIAL/PLATELET
Abs Immature Granulocytes: 0.08 10*3/uL — ABNORMAL HIGH (ref 0.00–0.07)
Basophils Absolute: 0 10*3/uL (ref 0.0–0.1)
Basophils Relative: 0 %
Eosinophils Absolute: 0.1 10*3/uL (ref 0.0–0.5)
Eosinophils Relative: 1 %
HCT: 37.6 % (ref 36.0–46.0)
Hemoglobin: 11.7 g/dL — ABNORMAL LOW (ref 12.0–15.0)
Immature Granulocytes: 1 %
Lymphocytes Relative: 4 %
Lymphs Abs: 0.4 10*3/uL — ABNORMAL LOW (ref 0.7–4.0)
MCH: 32.4 pg (ref 26.0–34.0)
MCHC: 31.1 g/dL (ref 30.0–36.0)
MCV: 104.2 fL — ABNORMAL HIGH (ref 80.0–100.0)
Monocytes Absolute: 0.7 10*3/uL (ref 0.1–1.0)
Monocytes Relative: 7 %
Neutro Abs: 8.8 10*3/uL — ABNORMAL HIGH (ref 1.7–7.7)
Neutrophils Relative %: 87 %
Platelets: 165 10*3/uL (ref 150–400)
RBC: 3.61 MIL/uL — ABNORMAL LOW (ref 3.87–5.11)
RDW: 18.6 % — ABNORMAL HIGH (ref 11.5–15.5)
WBC: 10.1 10*3/uL (ref 4.0–10.5)
nRBC: 0 % (ref 0.0–0.2)

## 2021-12-28 LAB — BASIC METABOLIC PANEL
Anion gap: 7 (ref 5–15)
BUN: 11 mg/dL (ref 8–23)
CO2: 21 mmol/L — ABNORMAL LOW (ref 22–32)
Calcium: 7.9 mg/dL — ABNORMAL LOW (ref 8.9–10.3)
Chloride: 106 mmol/L (ref 98–111)
Creatinine, Ser: 0.9 mg/dL (ref 0.44–1.00)
GFR, Estimated: >60 mL/min (ref >60)
Glucose, Bld: 73 mg/dL (ref 70–99)
Potassium: 5.4 mmol/L — ABNORMAL HIGH (ref 3.5–5.1)
Sodium: 134 mmol/L — ABNORMAL LOW (ref 135–145)

## 2021-12-28 LAB — BRAIN NATRIURETIC PEPTIDE: B Natriuretic Peptide: 1019.7 pg/mL — ABNORMAL HIGH (ref 0.0–100.0)

## 2021-12-28 LAB — T4, FREE: Free T4: 1.48 ng/dL — ABNORMAL HIGH (ref 0.61–1.12)

## 2021-12-28 MED ORDER — ENSURE ENLIVE PO LIQD
237.0000 mL | Freq: Two times a day (BID) | ORAL | Status: DC
  Administered 2021-12-28 – 2022-01-02 (×9): 237 mL via ORAL

## 2021-12-28 MED ORDER — ADULT MULTIVITAMIN W/MINERALS CH
1.0000 | ORAL_TABLET | Freq: Every day | ORAL | Status: DC
Start: 2021-12-28 — End: 2022-01-02
  Administered 2021-12-28 – 2022-01-02 (×6): 1 via ORAL
  Filled 2021-12-28 (×6): qty 1

## 2021-12-28 MED ORDER — SODIUM ZIRCONIUM CYCLOSILICATE 10 G PO PACK
10.0000 g | PACK | Freq: Once | ORAL | Status: AC
  Administered 2021-12-28: 10 g via ORAL
  Filled 2021-12-28: qty 1

## 2021-12-28 MED ORDER — FUROSEMIDE 10 MG/ML IJ SOLN
20.0000 mg | Freq: Two times a day (BID) | INTRAMUSCULAR | Status: DC
Start: 2021-12-28 — End: 2021-12-28

## 2021-12-28 MED ORDER — FUROSEMIDE 10 MG/ML IJ SOLN
40.0000 mg | Freq: Two times a day (BID) | INTRAMUSCULAR | Status: DC
  Administered 2021-12-28 – 2021-12-29 (×2): 40 mg via INTRAVENOUS
  Filled 2021-12-28 (×3): qty 4

## 2021-12-28 NOTE — Evaluation (Signed)
Occupational Therapy Evaluation ?Patient Details ?Name: Kayla Harrison ?MRN: [id number] ?DOB: 01-Apr-1946 ?Today's Date: 12/28/2021 ? ? ?History of Present Illness Patient is a 76 year old female admitted from rehab with generalized weakness, decreased oral intake, fall, increased urinary frequency, urgency. Dx with sepsis 2* UTI. PMH: hypothyroidism, hyperlipidemia, hypertension, non Hodgkins lymphoma, chronic A-fib on Eliquis, ESBL UTI  ? ?Clinical Impression ?  ?Patient admitted from rehab, reports she was in the bed for the past week did not get to wheelchair or ambulate. At baseline patient lives alone and is able to care for herself and cat. Currently patient needing max A for bed mobility, max A to stand unable to side step, and total A for lower body ADLs. Patient with posterior lean in standing, attempt to have to side step to head of bed however starts sitting back to edge of bed "I have to sit." Patient would be unable to care for herself if D/C home from hospital, therefore recommend return to rehab to build strength, endurance, and balance to maximize independence with self care and transfers. Acute OT to follow.   ?   ? ?Recommendations for follow up therapy are one component of a multi-disciplinary discharge planning process, led by the attending physician.  Recommendations may be updated based on patient status, additional functional criteria and insurance authorization.  ? ?Follow Up Recommendations ? Skilled nursing-short term rehab (<3 hours/day)  ?  ?Assistance Recommended at Discharge Frequent or constant Supervision/Assistance  ?Patient can return home with the following Two people to help with walking and/or transfers;A lot of help with bathing/dressing/bathroom;Assistance with cooking/housework;Help with stairs or ramp for entrance ? ?  ?Functional Status Assessment ? Patient has had a recent decline in their functional status and demonstrates the ability to make significant improvements in  function in a reasonable and predictable amount of time.  ?Equipment Recommendations ? None recommended by OT  ?  ?   ?Precautions / Restrictions Precautions ?Precautions: Fall ?Restrictions ?Weight Bearing Restrictions: No  ? ?  ? ?Mobility Bed Mobility ?Overal bed mobility: Needs Assistance ?Bed Mobility: Supine to Sit, Sit to Supine ?  ?  ?Supine to sit: Max assist, HOB elevated ?Sit to supine: Max assist ?  ?General bed mobility comments: Patient needs increased time, verbal cues and max A to advance legs and assist with sitting trunk upright. Patient needing assist to lift legs back onto bed at end of session ?  ? ? ? ?  ?Balance Overall balance assessment: Needs assistance, History of Falls ?Sitting-balance support: Feet supported ?Sitting balance-Leahy Scale: Good ?  ?Postural control: Posterior lean ?Standing balance support: Reliant on assistive device for balance ?Standing balance-Leahy Scale: Zero ?Standing balance comment: max A ?  ?  ?  ?  ?  ?  ?  ?  ?  ?  ?  ?   ? ?ADL either performed or assessed with clinical judgement  ? ?ADL Overall ADL's : Needs assistance/impaired ?Eating/Feeding: Independent ?  ?Grooming: Oral care;Wash/dry face;Wash/dry hands;Set up;Sitting ?  ?Upper Body Bathing: Set up;Sitting ?  ?Lower Body Bathing: Maximal assistance;Sitting/lateral leans ?  ?Upper Body Dressing : Set up;Sitting ?  ?Lower Body Dressing: Total assistance;Sitting/lateral leans ?  ?Toilet Transfer: Maximal assistance;Rolling walker (2 wheels) ?Toilet Transfer Details (indicate cue type and reason): Patient needing two attempts to stand from elevated bed height. Unable to attempt side stepping and sits herself back onto the edge of bed ?Toileting- Clothing Manipulation and Hygiene: Total assistance;Sitting/lateral lean;Bed level ?  ?  ?  ?  Functional mobility during ADLs: Maximal assistance;Rolling walker (2 wheels);Cueing for safety;Cueing for sequencing ?General ADL Comments: Patient needing significant  assistance for functional transfers, lower body dressing due to decreased strength, balance, endurance, safety  ? ? ? ? ?Pertinent Vitals/Pain Pain Assessment ?Pain Assessment: Faces ?Faces Pain Scale: Hurts little more ?Pain Location: R arm ?Pain Descriptors / Indicators: Sore ?Pain Intervention(s): Monitored during session  ? ? ? ?Hand Dominance Right ?  ?Extremity/Trunk Assessment Upper Extremity Assessment ?Upper Extremity Assessment: Generalized weakness ?  ?Lower Extremity Assessment ?Lower Extremity Assessment: Defer to PT evaluation ?  ?  ?  ?Communication Communication ?Communication: No difficulties ?  ?Cognition Arousal/Alertness: Awake/alert ?Behavior During Therapy: [facility] for tasks assessed/performed ?Overall Cognitive Status: Within Functional Limits for tasks assessed ?  ?  ?  ?  ?  ?  ?  ?  ?  ?  ?  ?  ?  ?  ?  ?  ?  ?  ?  ?   ?   ?   ? ? ?Home Living Family/patient expects to be discharged to:: Private residence ?Living Arrangements: Alone ?  ?Type of Home: Other(Comment) (condo) ?Home Access: Level entry ?  ?  ?Home Layout: One level ?  ?  ?Bathroom Shower/Tub: Walk-in shower ?  ?Bathroom Toilet: Handicapped height ?  ?  ?Home Equipment: Grab bars - toilet;Grab bars - tub/shower;Rollator (4 wheels);Shower seat - built in;Cane - single Barista (2 wheels) ?  ?Additional Comments: Pt has cat named "Callie"., does not wear O2 at home ?  ? ?  ?Prior Functioning/Environment Prior Level of Function : Independent/Modified Independent;History of Falls (last six months) ?  ?  ?  ?  ?  ?  ?Mobility Comments: has been using Rollator ?ADLs Comments: does IADLs ?  ? ?  ?  ?OT Problem List: Decreased strength;Decreased activity tolerance;Impaired balance (sitting and/or standing);Decreased safety awareness;Obesity ?  ?   ?OT Treatment/Interventions: Self-care/ADL training;Therapeutic exercise;Energy conservation;DME and/or AE instruction;Therapeutic activities;Patient/family education;Balance training   ?  ?OT Goals(Current goals can be found in the care plan section) Acute Rehab OT Goals ?Patient Stated Goal: Back to rehab to be able to walk ?OT Goal Formulation: With patient ?Time For Goal Achievement: 01/11/22 ?Potential to Achieve Goals: Good  ?OT Frequency: Min 2X/week ?  ? ?   ?AM-PAC OT "6 Clicks" Daily Activity     ?Outcome Measure Help from another person eating meals?: None ?Help from another person taking care of personal grooming?: A Little ?Help from another person toileting, which includes using toliet, bedpan, or urinal?: Total ?Help from another person bathing (including washing, rinsing, drying)?: A Lot ?Help from another person to put on and taking off regular upper body clothing?: A Little ?Help from another person to put on and taking off regular lower body clothing?: Total ?6 Click Score: 14 ?  ?End of Session Equipment Utilized During Treatment: Rolling walker (2 wheels) ?Nurse Communication: Mobility status ? ?Activity Tolerance: Patient limited by fatigue ?Patient left: in bed;with call bell/phone within reach;with bed alarm set ? ?OT Visit Diagnosis: Unsteadiness on feet (R26.81);Other abnormalities of gait and mobility (R26.89);History of falling (Z91.81)  ?              ?Time: [phone] ?OT Time Calculation (min): 28 min ?Charges:  OT General Charges ?$OT Visit: 1 Visit ?OT Evaluation ?$OT Eval Low Complexity: 1 Low ?OT Treatments ?$Self Care/Home Management : 8-22 mins ? ?Delbert Phenix OT ?OT pager: [phone] ? ? ?Rosemary Holms ?12/28/2021,  1:15 PM ?

## 2021-12-28 NOTE — Progress Notes (Signed)
?  Transition of Care (TOC) Screening Note ? ? ?Patient Details  ?Name: Kayla Harrison ?Date of Birth: June 03, 1946 ? ? ?Transition of Care (TOC) CM/SW Contact:    ?Benard Halsted, LCSW ?Phone Number: ?12/28/2021, 9:01 AM ? ? ? ?Transition of Care Department [facility]) has reviewed patient. She arrived to the hospital from [facility] where she was completing rehab. We will continue to monitor patient advancement through interdisciplinary progression rounds. If new patient transition needs arise, please place a TOC consult. ? ? ?

## 2021-12-28 NOTE — [place] FL2 (Signed)
?[place] MEDICAID FL2 LEVEL OF CARE SCREENING TOOL  ?  ? ?IDENTIFICATION  ?Patient Name: ?Kayla Harrison Birthdate: Jan 12, 1946 Sex: female Admission Date (Current Location): ?12/26/2021  ?[place] and [place] Number: ? [place] ?  Facility and Address:  ?The . [facility], [place] [address], [place], [place] [address] ?     Provider Number: ?[id number]  ?Attending Physician Name and Address:  ?Shelly Coss, MD ? Relative Name and Phone Number:  ?  ?   ?Current Level of Care: ?Hospital Recommended Level of Care: ?[place] Prior Approval Number: ?  ? ?Date Approved/Denied: ?  PASRR Number: ?[id number] A ? ?Discharge Plan: ?SNF ?  ? ?Current Diagnoses: ?Patient Active Problem List  ? Diagnosis Date Noted  ? UTI (urinary tract infection) 12/26/2021  ? Sepsis due to gram-negative UTI ([place]) 12/26/2021  ? AKI (acute kidney injury) ([place]) 12/26/2021  ? Chronic atrial fibrillation with RVR ([place]) 12/26/2021  ? Depression 12/26/2021  ? Dyslipidemia 12/26/2021  ? Hyponatremia 12/26/2021  ? History of fall 11/13/2021  ? History of hypotension 11/13/2021  ? Pressure ulcer with suspected deep tissue injury, stage II ([place]) 11/13/2021  ? Prolonged QT interval 11/13/2021  ? Traumatic hematoma of buttock 11/12/2021  ? Emphysematous cystitis 11/12/2021  ? Abnormal finding on imaging of liver 11/12/2021  ? Hypertension   ? Pressure injury of skin 03/15/2021  ? Intracranial hemorrhage ([place]) 03/14/2021  ? Lower extremity edema   ? Polymicrobial bacterial infection   ? 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   ? Enterococcus faecalis infection   ? Sepsis due to Escherichia coli [facility])   ? Debility 02/13/2021  ? Infection of wound hematoma 02/05/2021  ? Physical debility 12/22/2020  ? Tear of left hamstring 12/22/2020  ? Labral tear of left hip joint 12/22/2020  ? Acute blood loss anemia   ? Orthostatic hypotension 12/17/2020  ? History of CVA (cerebrovascular accident) 12/17/2020  ? Fall 12/15/2020  ? Hip  hematoma, left, initial encounter 12/15/2020  ? Nondisplaced fracture of coronoid process of left ulna, initial encounter for closed fracture 12/15/2020  ? Multiple rib fractures 12/15/2020  ? COVID-19 virus infection 10/12/2020  ? Constipation 06/22/2020  ? Change in bowel habits 06/22/2020  ? Diverticulosis 06/22/2020  ? Dark stools 06/22/2020  ? Hemorrhoids 06/22/2020  ? Pacemaker 01/23/2020  ? Junctional rhythm 01/23/2020  ? Palpitations 01/23/2020  ? ICH (intracerebral hemorrhage) ([place]) 12/02/2019  ? A-fib ([place]) 12/02/2019  ? Anemia 12/02/2019  ? QT prolongation 12/02/2019  ? Hypothyroidism 12/02/2019  ? Gait abnormality 07/10/2018  ? S/P reverse total shoulder arthroplasty, 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  ? Persistent atrial fibrillation ([place])   ? Bilateral ureteral calculi 11/17/2017  ? Atrial fibrillation with RVR ([place]) 09/15/2017  ? Atrial flutter ([place]) 07/17/2017  ? Port catheter in place 04/02/2017  ? Non Hodgkin's lymphoma ([place]) 02/13/2017  ? Mediastinal mass 02/06/2017  ? 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 Abnormal chest x-ray   ? Lung mass 02/03/2017  ? 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 OSA (obstructive sleep apnea) 02/03/2015  ? History of renal calculi 11/16/2014  ? Renal calculi 11/16/2014  ? Family history of colon cancer 10/26/2014  ? Mechanical complication due to cardiac pacemaker pulse generator 11/06/2013  ? Dyspnea 05/12/2013  ? Hypothyroidism 04/21/2013  ? Pleural effusion on right 04/19/2013  ? Long term (current) use of anticoagulants 12/20/2010  ? EPIDERMOID CYST 08/22/2010  ? Chronic diastolic heart failure ([place]) 08/06/2010  ? SYNCOPE AND COLLAPSE 07/27/2010  ?  OSTEOARTHRITIS, KNEE, RIGHT 03/15/2010  ? Class 1 obesity due to excess calories with body mass index (BMI) of 33.0 to 33.9 in adult 12/07/2009  ? NONSPEC ELEVATION OF LEVELS OF TRANSAMINASE/LDH 09/08/2009  ? BREAST CANCER, HX OF 07/25/2009  ? COLONIC POLYPS, HX OF 07/25/2009  ? TUBULOVILLOUS ADENOMA, COLON 04/29/2008  ?  HYPERGLYCEMIA, FASTING 04/29/2008  ? HYPERLIPIDEMIA 06/22/2007  ? CARCINOMA, THYROID GLAND, HX OF 06/22/2007  ? ? ?Orientation RESPIRATION BLADDER Height & Weight   ?  ?Self, Time, Situation, Place ? Normal Continent, External catheter Weight: 197 lb 15.6 oz (89.8 kg) ?Height:  5\' 6"  (167.6 cm)  ?BEHAVIORAL SYMPTOMS/MOOD NEUROLOGICAL BOWEL NUTRITION STATUS  ?    Continent Diet (See dc summary)  ?AMBULATORY STATUS COMMUNICATION OF NEEDS Skin   ?Limited Assist Verbally Normal ?  ?  ?  ?    ?     ?     ? ? ?Personal Care Assistance Level of Assistance  ?Bathing, Feeding, Dressing Bathing Assistance: Limited assistance ?Feeding assistance: Independent ?Dressing Assistance: Limited assistance ?   ? ?Functional Limitations Info  ?    ?  ?   ? ? ?SPECIAL CARE FACTORS FREQUENCY  ?PT (By licensed PT), OT (By licensed OT)   ?  ?PT Frequency: 5x/week ?OT Frequency: 5x/week ?  ?  ?  ?   ? ? ?Contractures Contractures Info: Not present  ? ? ?Additional Factors Info  ?Code Status, Allergies, Isolation Precautions Code Status Info: DNR ?Allergies Info: Ranolazine, Rivaroxaban, Tape, Tikosyn (Dofetilide), Epinephrine, Ketamine, Morphine ?  ?  ?Isolation Precautions Info: OMDRO, ESBL ?   ? ?Current Medications (12/28/2021):  This is the current hospital active medication list ?Current Facility-Administered Medications  ?Medication Dose Route Frequency Provider Last Rate Last Admin  ? (feeding supplement) PROSource Plus liquid 30 mL  30 mL Oral BID BM Mansy, Jan A, MD   30 mL at 12/27/21 0941  ? amiodarone (PACERONE) tablet 200 mg  200 mg Oral BID Mansy, Jan A, MD   200 mg at 12/27/21 2151  ? apixaban (ELIQUIS) tablet 5 mg  5 mg Oral BID Mansy, Jan A, MD   5 mg at 12/27/21 2150  ? calcium-vitamin D (OSCAL WITH D) 500-5 MG-MCG per tablet 1 tablet  1 tablet Oral Q breakfast Mansy, Jan A, MD      ? cycloSPORINE (RESTASIS) 0.05 % ophthalmic emulsion 1 drop  1 drop Both Eyes Daily Mansy, Jan A, MD      ? diltiazem (CARDIZEM) 125 mg in  dextrose 5% 125 mL (1 mg/mL) infusion  5-15 mg/hr Intravenous Titrated Shelly Coss, MD   Held at 12/27/21 1810  ? feeding supplement (ENSURE ENLIVE / ENSURE PLUS) liquid 237 mL  237 mL Oral BID BM Adhikari, Amrit, MD      ? furosemide (LASIX) injection 40 mg  40 mg Intravenous Q12H Adhikari, Tamsen Meek, MD      ? HYDROcodone-acetaminophen (NORCO/VICODIN) 5-325 MG per tablet 1 tablet  1 tablet Oral BID PRN Mansy, Jan A, MD      ? levothyroxine (SYNTHROID) tablet 125 mcg  125 mcg Oral Q0600 Mansy, Jan A, MD   125 mcg at 12/28/21 0603  ? lidocaine (LIDODERM) 5 % 2 patch  2 patch Transdermal QODAY Mansy, Jan A, MD   2 patch at 12/27/21 0017  ? magnesium oxide (MAG-OX) tablet 400 mg  400 mg Oral Daily Mansy, Jan A, MD   400 mg at 12/27/21 0940  ? meropenem (MERREM) 2 g in  sodium chloride 0.9 % 100 mL IVPB  2 g Intravenous Q8H Mansy, Jan A, MD 280 mL/hr at 12/28/21 0608 2 g at 12/28/21 7619  ? metoprolol succinate (TOPROL-XL) 24 hr tablet 25 mg  25 mg Oral Daily Mansy, Jan A, MD   25 mg at 12/27/21 0940  ? midodrine (PROAMATINE) tablet 5 mg  5 mg Oral BID WC Mansy, Jan A, MD   5 mg at 12/27/21 5093  ? multivitamin with minerals tablet 1 tablet  1 tablet Oral Daily Shelly Coss, MD      ? Muscle Rub CREA 1 application.  1 application. Topical Daily Mansy, Arvella Merles, MD   1 application. at 12/27/21 0940  ? rosuvastatin (CRESTOR) tablet 10 mg  10 mg Oral Once per day on Mon Thu Mansy, Jan A, MD   10 mg at 12/27/21 2150  ? sodium zirconium cyclosilicate (LOKELMA) packet 10 g  10 g Oral Once Shelly Coss, MD      ? ? ? ?Discharge Medications: ?Please see discharge summary for a list of discharge medications. ? ?Relevant Imaging Results: ? ?Relevant Lab Results: ? ? ?Additional Information ?SSN: [id number]; COVID vaccinated with booster ? ?Lissa Morales Rhyen Mazariego, LCSW ? ? ? ? ?

## 2021-12-28 NOTE — Evaluation (Signed)
Physical Therapy Evaluation ?Patient Details ?Name: Kayla Harrison ?MRN: [id number] ?DOB: [birth year] ?Today's Date: 12/28/2021 ? ?History of Present Illness ? Patient is a 76 year old female admitted from rehab with generalized weakness, decreased oral intake, fall, increased urinary frequency, urgency. Dx with sepsis 2* UTI. PMH: hypothyroidism, hyperlipidemia, hypertension, non Hodgkins lymphoma, chronic A-fib on Eliquis, ESBL UTI  ?Clinical Impression ? Pt admitted with above diagnosis. Pt was able to stand at EOB for up to 40 seconds with use of RW and max assist. Pt fatigues quickly but was able to participate and is motivated to try. Pt will need SNF with therapy. Will follow acutely. Pt currently with functional limitations due to the deficits listed below (see PT Problem List). Pt will benefit from skilled PT to increase their independence and safety with mobility to allow discharge to the venue listed below.      ?   ? ?Recommendations for follow up therapy are one component of a multi-disciplinary discharge planning process, led by the attending physician.  Recommendations may be updated based on patient status, additional functional criteria and insurance authorization. ? ?Follow Up Recommendations Skilled nursing-short term rehab (<3 hours/day) ? ?  ?Assistance Recommended at Discharge Frequent or constant Supervision/Assistance  ?Patient can return home with the following ? A lot of help with walking and/or transfers;A lot of help with bathing/dressing/bathroom;Help with stairs or ramp for entrance;Assist for transportation ? ?  ?Equipment Recommendations None recommended by PT  ?Recommendations for Other Services ?    ?  ?Functional Status Assessment Patient has had a recent decline in their functional status and demonstrates the ability to make significant improvements in function in a reasonable and predictable amount of time.  ? ?  ?Precautions / Restrictions Precautions ?Precautions:  Fall ?Restrictions ?Weight Bearing Restrictions: No  ? ?  ? ?Mobility ? Bed Mobility ?Overal bed mobility: Needs Assistance ?Bed Mobility: Supine to Sit, Sit to Supine ?  ?  ?Supine to sit: Max assist, HOB elevated ?Sit to supine: Max assist ?  ?General bed mobility comments: Patient needs increased time, verbal cues and max A to advance legs and assist with sitting trunk upright. Patient needing assist to lift legs back onto bed at end of session ?  ? ?Transfers ?Overall transfer level: Needs assistance ?Equipment used: Rolling walker (2 wheels) ?Transfers: Sit to/from Stand ?Sit to Stand: Max assist, From elevated surface ?  ?  ?  ?  ?  ?General transfer comment: Needed max assist to stand to RW with first attempt unsuccessful for full upright stance. Second attempt, pt was able to stand upright to RW and need for max assist for support to stand and mod assist to maintain standing for up to 40 seconds. Took a few side steps to [facility] as well and then sat abruptly onto bed as pt was fatigued. Had her scoot to [facility] and pt gave out with a few scoots stating, "I'm losing my breath." ?  ? ?Ambulation/Gait ?  ?  ?  ?  ?  ?  ?  ?  ? ?Stairs ?  ?  ?  ?  ?  ? ?Wheelchair Mobility ?  ? ?Modified Rankin (Stroke Patients Only) ?  ? ?  ? ?Balance Overall balance assessment: Needs assistance, History of Falls ?Sitting-balance support: Feet supported, No upper extremity supported ?Sitting balance-Leahy Scale: Good ?  ?Postural control: Posterior lean ?Standing balance support: Reliant on assistive device for balance ?Standing balance-Leahy Scale: Zero ?Standing balance comment: max A  with close to upright stance with need for RW and external support with pt with posterior lean. ?  ?  ?  ?  ?  ?  ?  ?  ?  ?  ?  ?   ? ? ? ?Pertinent Vitals/Pain Pain Assessment ?Pain Assessment: Faces ?Faces Pain Scale: Hurts little more ?Pain Location: R arm ?Pain Descriptors / Indicators: Sore ?Pain Intervention(s): Limited activity within patient's  tolerance, Monitored during session, Repositioned  ? ? ?Home Living Family/patient expects to be discharged to:: Private residence ?Living Arrangements: Alone ?  ?Type of Home: Other(Comment) (condo) ?Home Access: Level entry ?  ?  ?  ?Home Layout: One level ?Home Equipment: Grab bars - toilet;Grab bars - tub/shower;Rollator (4 wheels);Shower seat - built in;Cane - single Barista (2 wheels) ?Additional Comments: Pt has cat named "Callie"., does not wear O2 at home  ?  ?Prior Function Prior Level of Function : Independent/Modified Independent;History of Falls (last six months) ?  ?  ?  ?  ?  ?  ?Mobility Comments: has been using Rollator ?ADLs Comments: does IADLs ?  ? ? ?Hand Dominance  ? Dominant Hand: Right ? ?  ?Extremity/Trunk Assessment  ? Upper Extremity Assessment ?Upper Extremity Assessment: Defer to OT evaluation ?  ? ?Lower Extremity Assessment ?Lower Extremity Assessment: RLE deficits/detail;LLE deficits/detail ?RLE Deficits / Details: grossly 2-/5 ?LLE Deficits / Details: grossly 2-/5 ?  ? ?Cervical / Trunk Assessment ?Cervical / Trunk Assessment: Kyphotic  ?Communication  ? Communication: No difficulties  ?Cognition Arousal/Alertness: Awake/alert ?Behavior During Therapy: [facility] for tasks assessed/performed ?Overall Cognitive Status: Within Functional Limits for tasks assessed ?  ?  ?  ?  ?  ?  ?  ?  ?  ?  ?  ?  ?  ?  ?  ?  ?  ?  ?  ? ?  ?General Comments General comments (skin integrity, edema, etc.): Pt's UEs weeping. Nurse was aware.  HR up to 128 bpm with nurse coming in and hanging IV med for HR.  O2 sats on RA >90%. ? ?  ?Exercises General Exercises - Lower Extremity ?Ankle Circles/Pumps: AROM, Both, 5 reps, Supine ?Long Arc Quad: AROM, Both, 10 reps, Seated  ? ?Assessment/Plan  ?  ?PT Assessment Patient needs continued PT services  ?PT Problem List Decreased activity tolerance;Decreased balance;Decreased mobility;Decreased knowledge of use of DME;Decreased safety awareness;Decreased  knowledge of precautions ? ?   ?  ?PT Treatment Interventions DME instruction;Gait training;Functional mobility training;Therapeutic activities;Therapeutic exercise;Balance training;Patient/family education   ? ?PT Goals (Current goals can be found in the Care Plan section)  ?Acute Rehab PT Goals ?Patient Stated Goal: to go home ?PT Goal Formulation: With patient ?Time For Goal Achievement: 01/11/22 ?Potential to Achieve Goals: Good ? ?  ?Frequency Min 3X/week ?  ? ? ?Co-evaluation   ?  ?  ?  ?  ? ? ?  ?AM-PAC PT "6 Clicks" Mobility  ?Outcome Measure Help needed turning from your back to your side while in a flat bed without using bedrails?: A Lot ?Help needed moving from lying on your back to sitting on the side of a flat bed without using bedrails?: A Lot ?Help needed moving to and from a bed to a chair (including a wheelchair)?: A Lot ?Help needed standing up from a chair using your arms (e.g., wheelchair or bedside chair)?: A Lot ?Help needed to walk in hospital room?: Total ?Help needed climbing 3-5 steps with a railing? : Total ?6 Click  Score: 10 ? ?  ?End of Session Equipment Utilized During Treatment: Gait belt ?Activity Tolerance: Patient limited by fatigue ?Patient left: with call bell/phone within reach;in bed;with chair alarm set;with nursing/sitter in room ?Nurse Communication: Mobility status;Need for lift equipment ?PT Visit Diagnosis: Unsteadiness on feet (R26.81);Muscle weakness (generalized) (M62.81) ?  ? ?Time: 2194-7125 ?PT Time Calculation (min) (ACUTE ONLY): 32 min ? ? ?Charges:   PT Evaluation ?$PT Eval Moderate Complexity: 1 Mod ?PT Treatments ?$Therapeutic Activity: 8-22 mins ?  ?   ? ? ?Skylin Kennerson M,PT ?Acute Rehab Services ?[phone] ?[phone] (pager)  ? ?Alvira Philips ?12/28/2021, 1:24 PM ? ?

## 2021-12-28 NOTE — TOC Initial Note (Signed)
Transition of Care (TOC) - Initial/Assessment Note  ? ? ?Patient Details  ?Name: Kayla Harrison ?MRN: [id number] ?Date of Birth: 1946-03-16 ? ?Transition of Care (TOC) CM/SW Contact:    ?Benard Halsted, LCSW ?Phone Number: ?12/28/2021, 10:11 AM ? ?Clinical Narrative:                 ?CSW spoke with patient and confirmed that she has been completing rehab at [facility] the past two months and would like to return there at discharge. CSW spoke with Admissions at [facility] and patient is able to return when ready.   ? ?Expected Discharge Plan: [place] ?Barriers to Discharge: Continued Medical Work up ? ? ?Patient Goals and CMS Choice ?Patient states their goals for this hospitalization and ongoing recovery are:: Return to SNF ?CMS Medicare.gov Compare Post Acute Care list provided to:: Patient ?Choice offered to / list presented to : Patient ? ?Expected Discharge Plan and Services ?Expected Discharge Plan: [place] ?In-house Referral: Clinical Social Work ?  ?Post Acute Care Choice: [place] ?Living arrangements for the past 2 months: [place] ?                ?  ?  ?  ?  ?  ?  ?  ?  ?  ?  ? ?Prior Living Arrangements/Services ?Living arrangements for the past 2 months: [place] ?Lives with:: Facility Resident, Self ?Patient language and need for interpreter reviewed:: Yes ?Do you feel safe going back to the place where you live?: Yes      ?Need for Family Participation in Patient Care: No (Comment) ?Care giver support system in place?: Yes (comment) ?  ?Criminal Activity/Legal Involvement Pertinent to Current Situation/Hospitalization: No - Comment as needed ? ?Activities of Daily Living ?Home Assistive Devices/Equipment: [place] Rile (specify type) ?ADL Screening (condition at time of admission) ?Patient's cognitive ability adequate to safely complete daily activities?: Yes ?Is the patient deaf or have difficulty hearing?: No ?Does the patient have  difficulty seeing, even when wearing glasses/contacts?: No ?Does the patient have difficulty concentrating, remembering, or making decisions?: No ?Patient able to express need for assistance with ADLs?: Yes ?Does the patient have difficulty dressing or bathing?: No ?Independently performs ADLs?: Yes (appropriate for developmental age) ?Does the patient have difficulty walking or climbing stairs?: Yes ?Weakness of Legs: Both ?Weakness of Arms/Hands: Right ? ?Permission Sought/Granted ?Permission sought to share information with : Customer service manager ?Permission granted to share information with : Yes, Verbal Permission Granted ? Share Information with NAME: Kayla Harrison ? Permission granted to share info w AGENCY: [place] ? Permission granted to share info w Relationship: Sister ? Permission granted to share info w Contact Information: [phone] ? ?Emotional Assessment ?Appearance:: Appears stated age ?Attitude/Demeanor/Rapport: Engaged ?Affect (typically observed): Accepting, Appropriate ?Orientation: : Oriented to Self, Oriented to Place, Oriented to  Time, Oriented to Situation ?Alcohol / Substance Use: Not Applicable ?Psych Involvement: No (comment) ? ?Admission diagnosis:  UTI (urinary tract infection) [N39.0] ?Urinary tract infection without hematuria, site unspecified [N39.0] ?Patient Active Problem List  ? Diagnosis Date Noted  ? UTI (urinary tract infection) 12/26/2021  ? Sepsis due to gram-negative UTI ([place]) 12/26/2021  ? AKI (acute Harrison injury) ([place]) 12/26/2021  ? Chronic atrial fibrillation with RVR ([place]) 12/26/2021  ? Depression 12/26/2021  ? Dyslipidemia 12/26/2021  ? Hyponatremia 12/26/2021  ? History of fall 11/13/2021  ? History of hypotension 11/13/2021  ? Pressure ulcer with suspected deep tissue injury, stage  II ([place]) 11/13/2021  ? Prolonged QT interval 11/13/2021  ? Traumatic hematoma of buttock 11/12/2021  ? Emphysematous cystitis 11/12/2021  ? Abnormal finding on imaging of liver  11/12/2021  ? Hypertension   ? Pressure injury of skin 03/15/2021  ? Intracranial hemorrhage ([place]) 03/14/2021  ? Lower extremity edema   ? Polymicrobial bacterial infection   ? 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   ? Enterococcus faecalis infection   ? Sepsis due to Escherichia coli [facility])   ? Debility 02/13/2021  ? Infection of wound hematoma 02/05/2021  ? Physical debility 12/22/2020  ? Tear of left hamstring 12/22/2020  ? Labral tear of left hip joint 12/22/2020  ? Acute blood loss anemia   ? Orthostatic hypotension 12/17/2020  ? History of CVA (cerebrovascular accident) 12/17/2020  ? Fall 12/15/2020  ? Hip hematoma, left, initial encounter 12/15/2020  ? Nondisplaced fracture of coronoid process of left ulna, initial encounter for closed fracture 12/15/2020  ? Multiple rib fractures 12/15/2020  ? COVID-19 virus infection 10/12/2020  ? Constipation 06/22/2020  ? Change in bowel habits 06/22/2020  ? Diverticulosis 06/22/2020  ? Dark stools 06/22/2020  ? Hemorrhoids 06/22/2020  ? Pacemaker 01/23/2020  ? Junctional rhythm 01/23/2020  ? Palpitations 01/23/2020  ? ICH (intracerebral hemorrhage) ([place]) 12/02/2019  ? A-fib ([place]) 12/02/2019  ? Anemia 12/02/2019  ? QT prolongation 12/02/2019  ? Hypothyroidism 12/02/2019  ? Gait abnormality 07/10/2018  ? S/P reverse total shoulder arthroplasty, 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  ? Persistent atrial fibrillation ([place])   ? Bilateral ureteral calculi 11/17/2017  ? Atrial fibrillation with RVR ([place]) 09/15/2017  ? Atrial flutter ([place]) 07/17/2017  ? Port catheter in place 04/02/2017  ? Non Hodgkin's lymphoma ([place]) 02/13/2017  ? Mediastinal mass 02/06/2017  ? 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 Abnormal chest x-ray   ? Lung mass 02/03/2017  ? 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 OSA (obstructive sleep apnea) 02/03/2015  ? History of renal calculi 11/16/2014  ? Renal calculi 11/16/2014  ? Family history of colon cancer 10/26/2014  ? Mechanical complication due to cardiac  pacemaker pulse generator 11/06/2013  ? Dyspnea 05/12/2013  ? Hypothyroidism 04/21/2013  ? Pleural effusion on right 04/19/2013  ? Long term (current) use of anticoagulants 12/20/2010  ? EPIDERMOID CYST 08/22/2010  ? Chronic diastolic heart failure ([place]) 08/06/2010  ? SYNCOPE AND COLLAPSE 07/27/2010  ? OSTEOARTHRITIS, KNEE, RIGHT 03/15/2010  ? Class 1 obesity due to excess calories with body mass index (BMI) of 33.0 to 33.9 in adult 12/07/2009  ? NONSPEC ELEVATION OF LEVELS OF TRANSAMINASE/LDH 09/08/2009  ? BREAST CANCER, HX OF 07/25/2009  ? COLONIC POLYPS, HX OF 07/25/2009  ? TUBULOVILLOUS ADENOMA, COLON 04/29/2008  ? HYPERGLYCEMIA, FASTING 04/29/2008  ? HYPERLIPIDEMIA 06/22/2007  ? CARCINOMA, THYROID GLAND, HX OF 06/22/2007  ? ?PCP:  [name], MD ?Pharmacy:   ?[facility] [place] ?[place] ?[place] [address] ?Phone: [phone] Fax: [phone] ? ? ? ? ?Social Determinants of Health (SDOH) Interventions ?  ? ?Readmission Risk Interventions ? ?  12/28/2021  ?  9:28 AM  ?Readmission Risk Prevention Plan  ?Transportation Screening Complete  ?Medication Review Press photographer) Complete  ?PCP or Specialist appointment within 3-5 days of discharge Complete  ?[place] or Home Care Consult Complete  ?SW Recovery Care/Counseling Consult Complete  ?Palliative Care Screening Not Applicable  ?Skilled Nursing Facility Complete  ? ? ? ?

## 2021-12-28 NOTE — Progress Notes (Addendum)
?PROGRESS NOTE ? ?Kayla Harrison  KYH:[id number] DOB: 10/29/1945 DOA: 12/26/2021 ?PCP: [name], MD  ? ?Brief Narrative: ? ?Patient is a 76 year old female with history of hypothyroidism, hyperlipidemia, hypertension, non Hodgkins lymphoma, chronic A-fib on Eliquis, ESBL UTI who presented from [place] with complaint of generalized weakness, decreased oral intake, fall , increased urinary frequency, urgency.  There was also report of 45 pounds weight loss in last 4 months.  On presentation, she was in A-fib with RVR.  Chest x-ray showed hazy opacity throughout the right thorax consistent with layering pleural effusion slightly increased from prior.  UA was suspicious for UTI.  Urine culture, blood culture sent.  Patient was admitted for the management of sepsis secondary to UTI. ? ?Assessment & Plan: ? ?Principal Problem: ?  UTI (urinary tract infection) ?Active Problems: ?  Sepsis due to gram-negative UTI ([place]) ?  AKI (acute kidney injury) ([place]) ?  Chronic atrial fibrillation with RVR (H[facility]) ?  Hypothyroidism ?  Hyponatremia ?  Dyslipidemia ?  Depression ?  Pleural effusion on right ?  Non Hodgkin's lymphoma ([place]) ? ?Sepsis secondary to UTI: Patient has history of ESBL UTI.  Presented with increased urgency, dysuria.  Lab work showed leukocytosis.  Started on meropenem due to history of ESBL UTI.  Follow-up urine culture, culture showing significant gram negative rods.Follow up blood culture. IV fluids stopped ? ?Chronic diastolic congestive heart failure: Last echo done on 2/23 showed EF of 55 to 60%.  Patient looks volume overloaded.  Started on Lasix IV. ? ?AKI: Resolved with IV fluids, but now looks volume overloaded, started on Lasix.  Monitor BMP ? ?Chronic A-fib: Presented with RVR.  On amiodarone and Eliquis for anticoagulation.  Monitor on telemetry.  On rate control with metoprolol.  Heart rate ranging from 100-120.  Continue to monitor.  Started on Cardizem drip, will consider changing to oral  Cardizem ? ?Hyponatremia: Improved,Monitor ? ?Hypothyroidism: Takes Synthyroid.  Elevated TSH of 12.37,elevated free T4.  Discrepancy in the results.  Check TFT in 4 to 6 weeks.  No change will be made to the Synthyroid dose ? ?Hyperlipidemia: On statin ? ?Depression: On Zoloft ? ?Chronic right pleural effusion: No respiratory symptoms.  Continue to monitor ? ?History of non-Hodgkin's lymphoma: Status postchemotherapy. ? ?Edema/erythema of right forearm: No DVT or clots as per venous Doppler.  Looks like cellulitis.  Continue antibiotics ? ?Weakness/deconditioning: We requested  for PT/OT evaluation.  Complains of severe weakness.  Currently from [facility].  TOC consulted ? ? ?  ? ? ?Nutrition Problem: Inadequate oral intake ?Etiology: poor appetite ?  ? ?DVT prophylaxis: ?apixaban (ELIQUIS) tablet 5 mg  ? ?  Code Status: DNR ? ?Family Communication: Sister at bedside on 4/6. Called again today,call not received ? ?Patient status: Inpatient ? ?Patient is from : Skilled nursing facility ? ?Anticipated discharge to: Skilled nursing facility ? ?Estimated DC date: 2 to 3 days ? ? ?Consultants: None ? ? ?Procedures: None ? ?Antimicrobials:  ?Anti-infectives (From admission, onward)  ? ? Start     Dose/Rate Route Frequency Ordered Stop  ? 12/26/21 2100  meropenem (MERREM) 2 g in sodium chloride 0.9 % 100 mL IVPB       ? 2 g ?280 mL/hr over 30 Minutes Intravenous Every 8 hours 12/26/21 2038    ? 12/26/21 1945  piperacillin-tazobactam (ZOSYN) IVPB 3.375 g  Status:  Discontinued       ? 3.375 g ?100 mL/hr over 30 Minutes Intravenous Every 6 hours 12/26/21  1944 12/26/21 2038  ? ?  ? ? ?Subjective: ? ?Patient seen and examined at the bedside this morning.  She was in A-fib with RVR during my evaluation.  Denies any chest pain or shortness of breath.  Alert and oriented.  She says she feels little better today.  Looks very volume overloaded. ? ?Objective: ?Vitals:  ? 12/27/21 1949 12/27/21 2329 12/28/21 0330 12/28/21 9562   ?BP: (!) 120/96 107/87 113/83 111/87  ?Pulse: 100 100 100 100  ?Resp: 20 20 18 18   ?Temp: 97.8 ?F (36.6 ?C) 98.3 ?F (36.8 ?C) 98.2 ?F (36.8 ?C) 98.4 ?F (36.9 ?C)  ?TempSrc: Oral Oral Oral Oral  ?SpO2: 98% 98% 94% 94%  ?Weight: 89.8 kg     ?Height:      ? ? ?Intake/Output Summary (Last 24 hours) at 12/28/2021 1055 ?Last data filed at 12/28/2021 0600 ?Gross per 24 hour  ?Intake 900 ml  ?Output --  ?Net 900 ml  ? ?Filed Weights  ? 12/26/21 2008 12/27/21 1949  ?Weight: 85 kg 89.8 kg  ? ? ?Examination: ? ?General exam: Weak, deconditioned ?HEENT: PERRL ?Respiratory system:  no wheezes or crackles  ?Cardiovascular system: S1 & S2 heard, RRR.  ?Gastrointestinal system: Abdomen is nondistended, soft and nontender. ?Central nervous system: Alert and oriented ?Extremities: 2-3+ bilateral lower extremity pitting edema, no clubbing ,no cyanosis, edema of right upper extremity ?Skin: No rashes, no ulcers,no icterus   ? ? ?Data Reviewed: I have personally reviewed following labs and imaging studies ? ?CBC: ?Recent Labs  ?Lab 12/26/21 ?1637 12/27/21 ?1308 12/28/21 ?0139  ?WBC 13.6* 12.6* 10.1  ?NEUTROABS 12.5*  --  8.8*  ?HGB 12.4 11.4* 11.7*  ?HCT 40.8 37.5 37.6  ?MCV 105.2* 105.6* 104.2*  ?PLT 212 178 165  ? ?Basic Metabolic Panel: ?Recent Labs  ?Lab 12/26/21 ?1637 12/27/21 ?6578 12/28/21 ?0139  ?NA 131* 132* 134*  ?K 4.4 4.6 5.4*  ?CL 99 107 106  ?CO2 25 20* 21*  ?GLUCOSE 101* 89 73  ?BUN 10 10 11   ?CREATININE 1.02* 0.96 0.90  ?CALCIUM 8.3* 7.6* 7.9*  ? ? ? ?Recent Results (from the past 240 hour(s))  ?Urine Culture     Status: Abnormal (Preliminary result)  ? Collection Time: 12/26/21  6:10 PM  ? Specimen: Urine, Clean Catch  ?Result Value Ref Range Status  ? Specimen Description URINE, CLEAN CATCH  Final  ? Special Requests NONE  Final  ? Culture (A)  Final  ?  >=100,000 COLONIES/mL GRAM NEGATIVE RODS ?SUSCEPTIBILITIES TO FOLLOW ?Performed at [facility], [place] [address] [place], [place] [address] ?  ? Report Status  PENDING  Incomplete  ?Culture, blood (x 2)     Status: None (Preliminary result)  ? Collection Time: 12/26/21  8:27 PM  ? Specimen: BLOOD  ?Result Value Ref Range Status  ? Specimen Description BLOOD RIGHT ANTECUBITAL  Final  ? Special Requests   Final  ?  BOTTLES DRAWN AEROBIC AND ANAEROBIC Blood Culture results may not be optimal due to an excessive volume of blood received in culture bottles  ? Culture   Final  ?  NO GROWTH < 12 HOURS ?Performed at [facility], [place] [address], [place], [place] [address] ?  ? Report Status PENDING  Incomplete  ?Culture, blood (x 2)     Status: None (Preliminary result)  ? Collection Time: 12/26/21  9:24 PM  ? Specimen: BLOOD  ?Result Value Ref Range Status  ? Specimen Description BLOOD BLOOD LEFT  HAND  Final  ? Special Requests   Final  ?  BOTTLES DRAWN AEROBIC ONLY Blood Culture results may not be optimal due to an inadequate volume of blood received in culture bottles  ? Culture   Final  ?  NO GROWTH < 12 HOURS ?Performed at [facility], [place] [address] [place], [place] [address] ?  ? Report Status PENDING  Incomplete  ?  ? ?Radiology Studies: ?DG Chest Portable 1 View ? ?Result Date: 12/26/2021 ?CLINICAL DATA:  Weakness.  Failure to thrive. EXAM: PORTABLE CHEST 1 VIEW COMPARISON:  Most recent radiograph 11/18/2021.  CT 11/14/2018 FINDINGS: Left-sided pacemaker in place with intact leads. Prior left atrial clipping. Stable heart size and mediastinal contours. Hazy opacity throughout the right hemithorax consistent with layering pleural effusion, slight increase from prior exam possible small left pleural effusion. Patchy left lung base atelectasis. Linear opacity in the right mid lung corresponds to fissural thickening on CT. No pneumothorax. Reverse right shoulder arthroplasty. IMPRESSION: Hazy opacity throughout the right hemithorax consistent with layering pleural effusion, slight increase from prior exam. Possible small left pleural effusion. Electronically  Signed   By: Keith Rake M.D.   On: 12/26/2021 16:42  ? ?VAS [place] UPPER EXTREMITY VENOUS DUPLEX ? ?Result Date: 12/27/2021 ?UPPER VENOUS STUDY  Patient Name:  Kayla Harrison  Date of Exam:   12/27/2021 Medical Rec

## 2021-12-28 NOTE — Progress Notes (Signed)
Nutrition Follow-up ? ?DOCUMENTATION CODES:  ? ?Obesity unspecified ? ?INTERVENTION:  ? ?-Continue 30 ml Prosource Plus BID, each supplement provides 100 kcals and 15 grams protein ?-Ensure Enlive po BID, each supplement provides 350 kcal and 20 grams of protein ?-MVI with minerals daily ?-Liberalize diet to regular to provide wider variety of food selections in attempt to improve oral intake ? ?NUTRITION DIAGNOSIS:  ? ?Inadequate oral intake related to poor appetite as evidenced by per patient/family report. ? ?GOAL:  ? ?Patient will meet greater than or equal to 90% of their needs ? ?MONITOR:  ? ?PO intake, Supplement acceptance, Diet advancement, Labs, Weight trends, Skin, I & O's ? ?REASON FOR ASSESSMENT:  ? ?Malnutrition Screening Tool ?  ? ?ASSESSMENT:  ? ?Kayla Harrison is a 76 y.o. Caucasian female with medical history significant for hypothyroidism, dyslipidemia, hypertension, non-Hodgkin's lymphoma, chronic atrial fibrillation on Eliquis and  history of ESBL UTI who presented to the ER with acute onset of generalized weakness and failure to thrive with diminished p.o. intake.  This has been deteriorating over the last 3 weeks.  She had a fall and hematomas.  She had 3 blood transfusions since.  She tells me she has been having urinary frequency and urgency with occasional hematuria.  She denies any cough or wheezing or dyspnea.  No chest pain or palpitations.  No fever or chills.  No nausea or vomiting or abdominal pain.  She has bilateral upper and lower extremity edema.  She stated that she lost 45 pounds in the last 4 months. ? ?Pt admitted with sepsis secondary to gram negative UTI.  ? ?Reviewed I/O's: +1 L x 24 hours and +4.1 L since admission  ? ?Pt unavailable at time of visit. Attempted to speak with pt via call to hospital room phone, however, unable to reach. RD unable to obtain further nutrition-related history or complete nutrition-focused physical exam at this time.   ? ?Per H&P, pt with  decreased appetite over the past week. She has also had multiple falls. She is currently on a heart healthy diet, however, no meal completion data available to assess at this time. RD will liberalize diet to increase variety of food selections in attempt to promote adequate oral intake.  ? ?Reviewed wt hx; wt has been stable over the past 6 months. Per H&P, pt endorses a 25# wt loss over the past 4 months, which is not consistent with weight history.  ? ?Medications reviewed and include calcium with vitamin D, magnesium oxide, and lokelma.  ? ?Labs reviewed: Na: 134, K: 5.4.  ? ?Diet Order:   ?Diet Order   ? ?       ?  Diet Heart Room service appropriate? Yes; Fluid consistency: Thin  Diet effective now       ?  ? ?  ?  ? ?  ? ? ?EDUCATION NEEDS:  ? ?No education needs have been identified at this time ? ?Skin:  Skin Assessment: Reviewed RN Assessment ? ?Last BM:  Unknown ? ?Height:  ? ?Ht Readings from Last 1 Encounters:  ?12/26/21 5\' 6"  (1.676 m)  ? ? ?Weight:  ? ?Wt Readings from Last 1 Encounters:  ?12/27/21 89.8 kg  ? ? ?Ideal Body Weight:  59.1 kg ? ?BMI:  Body mass index is 31.95 kg/m?. ? ?Estimated Nutritional Needs:  ? ?Kcal:  1850-2050 ? ?Protein:  90-105 grams ? ?Fluid:  > 1.8 L ? ? ? ?Loistine Chance, RD, LDN, CDCES ?Registered Dietitian II ?Certified  Diabetes Care and Education Specialist ?Please refer to [facility] for RD and/or RD on-call/weekend/after hours pager  ?

## 2021-12-28 NOTE — Progress Notes (Signed)
?   12/28/21 0826  ?Assess: MEWS Score  ?Temp 98.4 ?F (36.9 ?C)  ?BP 111/87  ?Pulse Rate 100  ?ECG Heart Rate (!) 112  ?Resp 18  ?Level of Consciousness Alert  ?SpO2 94 %  ?Assess: MEWS Score  ?MEWS Temp 0  ?MEWS Systolic 0  ?MEWS Pulse 2  ?MEWS RR 0  ?MEWS LOC 0  ?MEWS Score 2  ?MEWS Score Color Yellow  ?Assess: if the MEWS score is Yellow or Red  ?Were vital signs taken at a resting state? Yes  ?Focused Assessment No change from prior assessment  ?Early Detection of Sepsis Score *See Row Information* Medium  ?MEWS guidelines implemented *See Row Information* No, previously yellow, continue vital signs every 4 hours  ?Treat  ?MEWS Interventions Administered scheduled meds/treatments  ?Pain Scale 0-10  ?Pain Score 0  ?Take Vital Signs  ?Increase Vital Sign Frequency  Yellow: Q 2hr X 2 then Q 4hr X 2, if remains yellow, continue Q 4hrs  ?Escalate  ?MEWS: Escalate Yellow: discuss with charge nurse/RN and consider discussing with provider and RRT  ?Notify: Charge Nurse/RN  ?Name of Charge Nurse/RN Notified John  ?Date Charge Nurse/RN Notified 12/28/21  ?Document  ?Patient Outcome Stabilized after interventions  ?Progress note created (see row info) Yes  ? ? ?

## 2021-12-29 DIAGNOSIS — N3000 Acute cystitis without hematuria: Secondary | ICD-10-CM | POA: Diagnosis not present

## 2021-12-29 LAB — BASIC METABOLIC PANEL
Anion gap: 8 (ref 5–15)
BUN: 14 mg/dL (ref 8–23)
CO2: 22 mmol/L (ref 22–32)
Calcium: 8.2 mg/dL — ABNORMAL LOW (ref 8.9–10.3)
Chloride: 104 mmol/L (ref 98–111)
Creatinine, Ser: 0.9 mg/dL (ref 0.44–1.00)
GFR, Estimated: >60 mL/min (ref >60)
Glucose, Bld: 103 mg/dL — ABNORMAL HIGH (ref 70–99)
Potassium: 3.7 mmol/L (ref 3.5–5.1)
Sodium: 134 mmol/L — ABNORMAL LOW (ref 135–145)

## 2021-12-29 LAB — URINE CULTURE: Culture: >=100000 — AB

## 2021-12-29 MED ORDER — FUROSEMIDE 10 MG/ML IJ SOLN
20.0000 mg | Freq: Two times a day (BID) | INTRAMUSCULAR | Status: DC
  Administered 2021-12-29 – 2021-12-30 (×3): 20 mg via INTRAVENOUS
  Filled 2021-12-29 (×2): qty 2

## 2021-12-29 MED ORDER — METOPROLOL SUCCINATE ER 50 MG PO TB24
50.0000 mg | ORAL_TABLET | Freq: Every day | ORAL | Status: DC
  Administered 2021-12-29: 50 mg via ORAL
  Filled 2021-12-29: qty 1

## 2021-12-29 MED ORDER — CEPHALEXIN 500 MG PO CAPS
500.0000 mg | ORAL_CAPSULE | Freq: Three times a day (TID) | ORAL | Status: DC
  Administered 2021-12-29 – 2022-01-01 (×9): 500 mg via ORAL
  Filled 2021-12-29 (×10): qty 1

## 2021-12-29 MED ORDER — MIDODRINE HCL 5 MG PO TABS
5.0000 mg | ORAL_TABLET | Freq: Three times a day (TID) | ORAL | Status: DC
  Administered 2021-12-29: 5 mg via ORAL
  Filled 2021-12-29: qty 1

## 2021-12-29 NOTE — Progress Notes (Signed)
?PROGRESS NOTE ? ?Kayla Harrison  YSA:[id number] DOB: 09/19/1946 DOA: 12/26/2021 ?PCP: [name], MD  ? ?Brief Narrative: ? ?Patient is a 76 year old female with history of hypothyroidism, hyperlipidemia, hypertension, non Hodgkins lymphoma, chronic A-fib on Eliquis, ESBL UTI who presented from [facility] with complaint of generalized weakness, decreased oral intake, fall , increased urinary frequency, urgency.  There was also report of 45 pounds weight loss in last 4 months.  On presentation, she was in A-fib with RVR.  Chest x-ray showed hazy opacity throughout the right thorax consistent with layering pleural effusion slightly increased from prior.  UA was suspicious for UTI.  Urine culture, blood culture sent.  Patient was admitted for the management of sepsis secondary to UTI. ? ?Assessment & Plan: ? ?Principal Problem: ?  UTI (urinary tract infection) ?Active Problems: ?  Sepsis due to gram-negative UTI ([place]) ?  AKI (acute kidney injury) ([place]) ?  Chronic atrial fibrillation with RVR ([facility]) ?  Hypothyroidism ?  Hyponatremia ?  Dyslipidemia ?  Depression ?  Pleural effusion on right ?  Non Hodgkin's lymphoma ([place]) ? ?Sepsis secondary to UTI: Patient has history of ESBL UTI.  Presented with increased urgency, dysuria.  Lab work showed leukocytosis.  Started on meropenem due to history of ESBL UTI.  Urine culture showed pansensitive E. coli, antibiotics changed to Keflex .Blood culture no growth till date ? ?Chronic diastolic congestive heart failure: Last echo done on 2/23 showed EF of 55 to 60%.  Patient still volume overloaded.  Started on Lasix IV.Tapered to 20 mg iv bid due to hypotension ? ?AKI: Resolved with IV fluids, but now looks volume overloaded, started on Lasix.  Monitor BMP ? ?Chronic A-fib: Presented with RVR.  On amiodarone and Eliquis for anticoagulation.  Monitor on telemetry.  On rate control with metoprolol.  Heart rate ranging from 100-120.  Continue to monitor.  Started on Cardizem  drip, now stopped due to hypotension.  EKG done today showed sinus tachycardia ? ?Hyponatremia: Improved,Monitor ? ?Hypothyroidism: Takes Synthyroid.  Elevated TSH of 12.37,elevated free T4.  Discrepancy in the results.  Check TFT in 4 to 6 weeks.  No change will be made to the Synthyroid dose ? ?Hyperlipidemia: On statin ? ?Depression: On Zoloft ? ?Chronic right pleural effusion: No respiratory symptoms.  Continue to monitor ? ?History of non-Hodgkin's lymphoma: Status postchemotherapy. ? ?Edema/erythema of right forearm: No DVT or clots as per venous Doppler.  Looks like cellulitis.  Continue antibiotics ? ?Weakness/deconditioning: We requested  for PT/OT evaluation,recommedned SNF on dc.  Complains of severe weakness.  Currently from [facility].  TOC consulted ? ? ?  ? ? ?Nutrition Problem: Inadequate oral intake ?Etiology: poor appetite ?  ? ?DVT prophylaxis: ?apixaban (ELIQUIS) tablet 5 mg  ? ?  Code Status: DNR ? ?Family Communication: Sister at bedside on 4/6.  ? ?Patient status: Inpatient ? ?Patient is from : Skilled nursing facility ? ?Anticipated discharge to: Skilled nursing facility ? ?Estimated DC date: 2 to 3 days.  Patient is still volume overloaded, requiring IV Lasix. ? ? ?Consultants: None ? ? ?Procedures: None ? ?Antimicrobials:  ?Anti-infectives (From admission, onward)  ? ? Start     Dose/Rate Route Frequency Ordered Stop  ? 12/29/21 1400  cephALEXin (KEFLEX) capsule 500 mg       ? 500 mg Oral Every 8 hours 12/29/21 0914    ? 12/26/21 2100  meropenem (MERREM) 2 g in sodium chloride 0.9 % 100 mL IVPB  Status:  Discontinued       ?  2 g ?280 mL/hr over 30 Minutes Intravenous Every 8 hours 12/26/21 2038 12/29/21 0914  ? 12/26/21 1945  piperacillin-tazobactam (ZOSYN) IVPB 3.375 g  Status:  Discontinued       ? 3.375 g ?100 mL/hr over 30 Minutes Intravenous Every 6 hours 12/26/21 1944 12/26/21 2038  ? ?  ? ? ?Subjective: ? ?Patient seen and examined at the bedside this morning.  Heart rate better  than yesterday, blood pressure was stable during my evaluation.  She says she feels better.  Still looks volume overloaded needing IV Lasix. ?Objective: ?Vitals:  ? 12/29/21 0600 12/29/21 0814 12/29/21 0949 12/29/21 1016  ?BP:  103/77 92/63 (!) 87/67  ?Pulse:  98 (!) 103 (!) 101  ?Resp:  18  20  ?Temp:  97.7 ?F (36.5 ?C)    ?TempSrc:  Axillary    ?SpO2:  97%  97%  ?Weight: 86.5 kg     ?Height:      ? ? ?Intake/Output Summary (Last 24 hours) at 12/29/2021 1130 ?Last data filed at 12/29/2021 0945 ?Gross per 24 hour  ?Intake 838.19 ml  ?Output 3100 ml  ?Net -2261.81 ml  ? ?Filed Weights  ? 12/26/21 2008 12/27/21 1949 12/29/21 0600  ?Weight: 85 kg 89.8 kg 86.5 kg  ? ? ?Examination: ? ?General exam: Overall comfortable, not in distress, very deconditioned elderly female ?HEENT: PERRL ?Respiratory system:  no wheezes or crackles  ?Cardiovascular system: S1 & S2 heard, RRR.  ?Gastrointestinal system: Abdomen is nondistended, soft and nontender. ?Central nervous system: Alert and oriented ?Extremities: 1-2+ edema in bilateral lower extremities, no clubbing ,no cyanosis ?Skin: No rashes, no ulcers,no icterus   ? ? ?Data Reviewed: I have personally reviewed following labs and imaging studies ? ?CBC: ?Recent Labs  ?Lab 12/26/21 ?1637 12/27/21 ?3875 12/28/21 ?0139  ?WBC 13.6* 12.6* 10.1  ?NEUTROABS 12.5*  --  8.8*  ?HGB 12.4 11.4* 11.7*  ?HCT 40.8 37.5 37.6  ?MCV 105.2* 105.6* 104.2*  ?PLT 212 178 165  ? ?Basic Metabolic Panel: ?Recent Labs  ?Lab 12/26/21 ?1637 12/27/21 ?6433 12/28/21 ?0139 12/29/21 ?0129  ?NA 131* 132* 134* 134*  ?K 4.4 4.6 5.4* 3.7  ?CL 99 107 106 104  ?CO2 25 20* 21* 22  ?GLUCOSE 101* 89 73 103*  ?BUN 10 10 11 14   ?CREATININE 1.02* 0.96 0.90 0.90  ?CALCIUM 8.3* 7.6* 7.9* 8.2*  ? ? ? ?Recent Results (from the past 240 hour(s))  ?Urine Culture     Status: Abnormal  ? Collection Time: 12/26/21  6:10 PM  ? Specimen: Urine, Clean Catch  ?Result Value Ref Range Status  ? Specimen Description URINE, CLEAN CATCH  Final   ? Special Requests   Final  ?  NONE ?Performed at [facility], [place] [address] [place], [place] [address] ?  ? Culture >=100,000 COLONIES/mL ESCHERICHIA COLI (A)  Final  ? Report Status 12/29/2021 FINAL  Final  ? Organism ID, Bacteria ESCHERICHIA COLI (A)  Final  ?    Susceptibility  ? Escherichia coli - MIC*  ?  AMPICILLIN <=2 SENSITIVE Sensitive   ?  CEFAZOLIN <=4 SENSITIVE Sensitive   ?  CEFEPIME <=0.12 SENSITIVE Sensitive   ?  CEFTRIAXONE <=0.25 SENSITIVE Sensitive   ?  CIPROFLOXACIN <=0.25 SENSITIVE Sensitive   ?  GENTAMICIN <=1 SENSITIVE Sensitive   ?  IMIPENEM <=0.25 SENSITIVE Sensitive   ?  NITROFURANTOIN <=16 SENSITIVE Sensitive   ?  TRIMETH/SULFA <=20 SENSITIVE Sensitive   ?  AMPICILLIN/SULBACTAM <=2 SENSITIVE Sensitive   ?  PIP/TAZO <=4 SENSITIVE Sensitive   ?  * >=100,000 COLONIES/mL ESCHERICHIA COLI  ?Culture, blood (x 2)     Status: None (Preliminary result)  ? Collection Time: 12/26/21  8:27 PM  ? Specimen: BLOOD  ?Result Value Ref Range Status  ? Specimen Description BLOOD RIGHT ANTECUBITAL  Final  ? Special Requests   Final  ?  BOTTLES DRAWN AEROBIC AND ANAEROBIC Blood Culture results may not be optimal due to an excessive volume of blood received in culture bottles  ? Culture   Final  ?  NO GROWTH 3 DAYS ?Performed at [facility], [place] [address] [place], [place] [address] ?  ? Report Status PENDING  Incomplete  ?Culture, blood (x 2)     Status: None (Preliminary result)  ? Collection Time: 12/26/21  9:24 PM  ? Specimen: BLOOD  ?Result Value Ref Range Status  ? Specimen Description BLOOD BLOOD LEFT HAND  Final  ? Special Requests   Final  ?  BOTTLES DRAWN AEROBIC ONLY Blood Culture results may not be optimal due to an inadequate volume of blood received in culture bottles  ? Culture   Final  ?  NO GROWTH 3 DAYS ?Performed at [facility], [place] [address] [place], [place] [address] ?  ? Report Status PENDING  Incomplete  ?  ? ?Radiology Studies: ?VAS [place] UPPER EXTREMITY VENOUS  DUPLEX ? ?Result Date: 12/27/2021 ?UPPER VENOUS STUDY  Patient Name:  Kayla Harrison  Date of Exam:   12/27/2021 Medical Rec #: [id number]            Accession #:    [id number] Date of Birth: 02-Oct-1945

## 2021-12-30 DIAGNOSIS — I5033 Acute on chronic diastolic (congestive) heart failure: Secondary | ICD-10-CM | POA: Diagnosis not present

## 2021-12-30 DIAGNOSIS — I482 Chronic atrial fibrillation, unspecified: Secondary | ICD-10-CM | POA: Diagnosis not present

## 2021-12-30 DIAGNOSIS — N3000 Acute cystitis without hematuria: Secondary | ICD-10-CM | POA: Diagnosis not present

## 2021-12-30 LAB — BASIC METABOLIC PANEL
Anion gap: 8 (ref 5–15)
BUN: 13 mg/dL (ref 8–23)
CO2: 27 mmol/L (ref 22–32)
Calcium: 7.9 mg/dL — ABNORMAL LOW (ref 8.9–10.3)
Chloride: 99 mmol/L (ref 98–111)
Creatinine, Ser: 0.81 mg/dL (ref 0.44–1.00)
GFR, Estimated: >60 mL/min (ref >60)
Glucose, Bld: 81 mg/dL (ref 70–99)
Potassium: 3.6 mmol/L (ref 3.5–5.1)
Sodium: 134 mmol/L — ABNORMAL LOW (ref 135–145)

## 2021-12-30 LAB — PROTIME-INR
INR: 2.3 — ABNORMAL HIGH (ref 0.8–1.2)
Prothrombin Time: 25 seconds — ABNORMAL HIGH (ref 11.4–15.2)

## 2021-12-30 MED ORDER — FUROSEMIDE 40 MG PO TABS
40.0000 mg | ORAL_TABLET | Freq: Every day | ORAL | Status: DC
  Administered 2021-12-30: 40 mg via ORAL
  Filled 2021-12-30: qty 1

## 2021-12-30 MED ORDER — METOPROLOL SUCCINATE ER 50 MG PO TB24
50.0000 mg | ORAL_TABLET | Freq: Every day | ORAL | Status: DC
  Administered 2021-12-31 – 2022-01-02 (×3): 50 mg via ORAL
  Filled 2021-12-30 (×3): qty 1

## 2021-12-30 MED ORDER — DIGOXIN 125 MCG PO TABS
0.1250 mg | ORAL_TABLET | Freq: Every day | ORAL | Status: DC
  Administered 2021-12-30 – 2022-01-01 (×3): 0.125 mg via ORAL
  Filled 2021-12-30 (×3): qty 1

## 2021-12-30 MED ORDER — MIDODRINE HCL 5 MG PO TABS
10.0000 mg | ORAL_TABLET | Freq: Three times a day (TID) | ORAL | Status: DC
  Administered 2021-12-30 – 2022-01-02 (×9): 10 mg via ORAL
  Filled 2021-12-30 (×9): qty 2

## 2021-12-30 MED ORDER — METOPROLOL SUCCINATE ER 50 MG PO TB24
75.0000 mg | ORAL_TABLET | Freq: Every day | ORAL | Status: DC
  Filled 2021-12-30: qty 1

## 2021-12-30 NOTE — Progress Notes (Signed)
?   12/30/21 1900  ?Assess: MEWS Score  ?Pulse Rate (!) 113  ?ECG Heart Rate (!) 113  ?Resp 20  ?Level of Consciousness Alert  ?SpO2 94 %  ?O2 Device Room Air  ?Assess: MEWS Score  ?MEWS Temp 0  ?MEWS Systolic 1  ?MEWS Pulse 2  ?MEWS RR 0  ?MEWS LOC 0  ?MEWS Score 3  ?MEWS Score Color Yellow  ?Assess: if the MEWS score is Yellow or Red  ?Were vital signs taken at a resting state? Yes  ?Focused Assessment No change from prior assessment  ?Early Detection of Sepsis Score *See Row Information* Low  ?MEWS guidelines implemented *See Row Information* No, previously yellow, continue vital signs every 4 hours  ?Treat  ?MEWS Interventions Administered scheduled meds/treatments  ?Pain Scale 0-10  ?Pain Score 0  ?Take Vital Signs  ?Increase Vital Sign Frequency  Yellow: Q 2hr X 2 then Q 4hr X 2, if remains yellow, continue Q 4hrs  ?Escalate  ?MEWS: Escalate Yellow: discuss with charge nurse/RN and consider discussing with provider and RRT  ?Notify: Charge Nurse/RN  ?Name of Charge Nurse/RN Notified nikki [email or web address]  ?Date Charge Nurse/RN Notified 12/30/21  ?Time Charge Nurse/RN Notified 1900  ? ? ?

## 2021-12-30 NOTE — Consult Note (Signed)
?Cardiology Consultation:  ? ?Patient ID: Kayla Harrison ?MRN: [id number]; DOB: Jun 02, 1946 ? ?Admit date: 12/26/2021 ?Date of Consult: 12/30/2021 ? ?PCP:  [name], MD ?  ?[facility] Providers ?Cardiologist:  [name], MD  ?Electrophysiologist:  [name], MD     ? ? ?Patient Profile:  ? ?Kayla Harrison is a 76 y.o. female with a hx of hypertension, hyperlipidemia, hypothyroidism, non-Hodgkin's lymphoma, persistent atrial fibrillation s/p ablation in 2008 and again in 4010 complicated by complete heart block requiring the placement of pacemaker, history of CVA, significant orthostasis, intracranial bleed 6/22, and SVC syndrome/lung cancer s/p chemo and radiation in 2018 followed by Dr. Payton Mccallum who is being seen 12/30/2021 for the evaluation of afib with RVR and orthostatic hypotension at the request of Dr. Tawanna Solo. ? ?History of Present Illness:  ? ?Kayla Harrison is a 76 year old female with past medical history of hypertension, hyperlipidemia, hypothyroidism, non-Hodgkin's lymphoma, persistent atrial fibrillation s/p ablation in 2008 and again in 2725 complicated by complete heart block requiring the placement of pacemaker, history of CVA, significant orthostasis, intracranial bleed 6/22, and SVC syndrome/lung cancer s/p chemo and radiation in 2018 followed by Dr. Payton Mccallum.  She previously underwent a right heart cath in 2020 by Dr. Ollen Bowl revealed elevated wedge pressure.  Subsequent TEE in November 2020 showed moderate MR. Patient was previously admitted in June 2022 with altered mental status.  CT of the head showed acute intracranial hemorrhage that was thought to be due to hypertension.  Anticoagulation was reversed however later resumed.  Echocardiogram obtained during the admission showed EF 30 to 60%, moderately elevated pulmonary artery systolic pressure, severe MR.  Patient was last seen by Dr. Caryl Comes in November 2022 at which time she was getting stronger however remained in atrial  fibrillation with elevated heart rate.  She underwent TEE DCCV on 10/04/2021 with restoration of sinus rhythm.  TEE showed EF 60%, mild to moderate MR, moderate TR, no thrombus or vegetation.  Patient was last admitted on 11/12/2021 with a fall that resulted in 2 large pockets of intramuscular hematoma in the right gluteus.  Eliquis was stopped.  She underwent blood transfusion.  Patient was noted to be in atrial fibrillation.  She was seen in the hospital by Dr. Caryl Comes, she had significant orthostatic hypotension with systolic blood pressure dropping down to the 50s upon standing.  Hospital course also complicated by emphysematous cystitis (with significant air in the bladder on CT image) with positive urine culture that grew ESBL E. coli.  CT renal stone study obtained on 11/12/2021 also mentioned hyperdense liver suggesting hemochromatosis, small to moderate right pleural effusion.  Dr. Caryl Comes recommended getting her back into normal rhythm once she is safe to go back on Eliquis.  Patient was discharged on midodrine 5 mg twice a day dosing.  Eliquis was restarted the day prior to discharge. ? ?Patient was eventually discharged back to [facility].  Since she is at the [facility], she has only been able to get up to walk roughly 50 feet with PT.  She spent majority of the time laying in bed.  She was eventually sent back to [facility] on 12/26/2021 due to diminished p.o. intake, generalized weakness and failure to thrive for the past 3 weeks.  She also admits to have frequent urination.  Although internal medicine's note mention occasional hematuria, patient denies any blood in the stool or in the urine.  She was treated for sepsis related to gram-negative UTI.  On arrival, creatinine 1.02.  Sodium 131, albumin 2.8.  White blood cell count 13.6.  Hemoglobin 12.4.  TSH 12.3.  Urinalysis showed large hemoglobin, trace leukocyte, 30 protein with rare proteinuria. Blood culture is currently negative.  Upper  extremity venous Doppler was negative for DVT.  Initial EKG showed wide-complex tachycardia.  BNP was 1019, whereas it was 254 about a month ago.  There was report of 45 pounds weight loss in the past 4 months.  Telemetry seems to suggest atrial fibrillation with RVR.  Cardiology service was consulted for both management of A-fib and also orthostatic hypotension during this admission so hopefully she can have a better recovery course when she goes back to [facility]. ? ? ? ? ?Past Medical History:  ?Diagnosis Date  ? Anemia   ? Arthritis   ? osteoarthritis - knees and right shoulder  ? Blood transfusion without reported diagnosis   ? Breast cancer ([place])   ? Dr Margot Chimes, total thyroidectomy- 1999- for cancer  ? Brucellosis 1964  ? Chronic bilateral pleural effusions   ? Colon polyp   ? Dr Earlean Shawl  ? Complication of anesthesia   ? Ketamine produces LSD reaction, bright colored nightmarish experience   ? Dyslipidemia   ? Endometriosis   ? Fibroids   ? H/O pleural effusion   ? s/p thoracentesis w 3262ml withdrawn  ? Hematoma 11/12/2021  ? Hepatitis   ? [place] ?hepatitis   ? History of dysphagia   ? due to radiation therapy  ? History of hiatal hernia   ? small noted on PET scan  ? Hypertension   ? Hypothyroidism   ? Lung cancer, lower lobe ([place]) 01/2017  ? radiation RX completed 03/04/17; will start chemo 6/27, pt unaware of lung cancer  ? Morbid obesity ([place])   ? Status post lap band surgery  ? Nephrolithiasis   ? Non Hodgkin's lymphoma ([place])   ? on chemotherapy  ? Persistent atrial fibrillation ([place])   ? a. s/p PVI 2008 b. s/p convergent ablation 9381 complicated by bradycardia requiring pacemaker implant  ? Personal history of radiation therapy   ? Presence of permanent cardiac pacemaker   ? Rotator cuff tear   ? Right  ? Stroke [facility])   ? 2003- [place] x2  ? SVC syndrome   ? with lung mass and non hodgkins lymphoma  ? Thyroid cancer ([place]) 2000  ? ? ?Past Surgical History:   ?Procedure Laterality Date  ? ABDOMINAL HYSTERECTOMY  1983  ? afib ablation    ? a. 2008 PVI b. 2014 convergent ablation  ? APPENDECTOMY    ? BONE MARROW BIOPSY  02/21/2017  ? BREAST LUMPECTOMY Left 2010  ? bso  1998  ? CARDIAC CATHETERIZATION    ? 2015- negative  ? CARDIOVERSION  10/09/2012  ? Procedure: CARDIOVERSION;  Surgeon: Minus Breeding, MD;  Location: [place];  Service: Cardiovascular;  Laterality: N/A;  ? CARDIOVERSION  10/09/2012  ? Procedure: CARDIOVERSION;  Surgeon: Minus Breeding, MD;  Location: [facility] ENDOSCOPY;  Service: Cardiovascular;  Laterality: N/A;  [name] gave the ok to add pt to the add on , but we must check to find out if the can add pt on at [phone] ( [phone])  ? CARDIOVERSION N/A 11/20/2012  ? Procedure: CARDIOVERSION;  Surgeon: Fay Records, MD;  Location: [place];  Service: Cardiovascular;  Laterality: N/A;  ? CARDIOVERSION N/A 07/18/2017  ? Procedure: CARDIOVERSION;  Surgeon: Thayer Headings, MD;  Location: [place];  Service: Cardiovascular;  Laterality: N/A;  ? CARDIOVERSION N/A 10/03/2017  ? Procedure: CARDIOVERSION;  Surgeon: Sanda Klein, MD;  Location: [place];  Service: Cardiovascular;  Laterality: N/A;  ? CARDIOVERSION N/A 01/07/2018  ? Procedure: CARDIOVERSION;  Surgeon: Thayer Headings, MD;  Location: [place];  Service: Cardiovascular;  Laterality: N/A;  ? CARDIOVERSION N/A 12/10/2019  ? Procedure: CARDIOVERSION;  Surgeon: Buford Dresser, MD;  Location: [place];  Service: Cardiovascular;  Laterality: N/A;  ? CARDIOVERSION N/A 03/09/2021  ? Procedure: CARDIOVERSION;  Surgeon: Pixie Casino, MD;  Location: [place];  Service: Cardiovascular;  Laterality: N/A;  ? CARDIOVERSION N/A 10/04/2021  ? Procedure: CARDIOVERSION;  Surgeon: Jerline Pain, MD;  Location: [facility] ENDOSCOPY;  Service: Cardiovascular;  Laterality: N/A;  ? CHOLECYSTECTOMY    ? COLONOSCOPY W/ POLYPECTOMY    ? Dr Earlean Shawl  ? CYSTOSCOPY N/A 02/06/2015  ? Procedure: CYSTOSCOPY;  Surgeon: Kathie Rhodes, MD;  Location: [facility];  Service: Urology;  Laterality: N/A;  ? CYSTOSCOPY W/ RETROGRADES Left 11/17/2017  ? Procedure: CYSTOSCOPY WITH RETROGRADE /PYELOGRAM/;  Surgeon: Kathie Rhodes, MD;  Location: [facility]

## 2021-12-30 NOTE — Progress Notes (Signed)
?PROGRESS NOTE ? ?Kayla Harrison  YTK:[id number] DOB: 07/09/1946 DOA: 12/26/2021 ?PCP: [name], MD  ? ?Brief Narrative: ? ?Patient is a 76 year old female with history of hypothyroidism, hyperlipidemia, hypertension, non Hodgkins lymphoma, chronic A-fib on Eliquis, ESBL UTI who presented from [facility] with complaint of generalized weakness, decreased oral intake, fall , increased urinary frequency, urgency.  There was also report of 45 pounds weight loss in last 4 months.  On presentation, she was in A-fib with RVR.  Chest x-ray showed hazy opacity throughout the right thorax consistent with layering pleural effusion slightly increased from prior.  UA was suspicious for UTI.  Urine culture, blood culture sent.  Patient was admitted for the management of sepsis secondary to UTI.  Overall status is getting better.  PT/OT recommending discharge back to facility when stable.  Possible discharge tomorrow ? ?Assessment & Plan: ? ?Principal Problem: ?  UTI (urinary tract infection) ?Active Problems: ?  Sepsis due to gram-negative UTI ([employer]) ?  AKI (acute kidney injury) ([place]) ?  Chronic atrial fibrillation with RVR ([facility]) ?  Hypothyroidism ?  Hyponatremia ?  Dyslipidemia ?  Depression ?  Pleural effusion on right ?  Non Hodgkin's lymphoma ([place]) ? ?Sepsis secondary to UTI: Patient has history of ESBL UTI.  Presented with increased urgency, dysuria.  Lab work showed leukocytosis.  Started on meropenem due to history of ESBL UTI.  Urine culture showed pansensitive E. coli, antibiotics changed to Keflex .Blood culture no growth till date ? ?Chronic diastolic congestive heart failure: Last echo done on 2/23 showed EF of 55 to 60%.  Patient still volume overloaded.  Started on Lasix IV.Tapered to 20 mg iv bid due to hypotension.  She still is volume overloaded, continue IV Lasix for today ? ?AKI: Resolved with IV fluids, but now looks volume overloaded, started on Lasix.  Monitor BMP ? ?Chronic A-fib: Presented with  RVR.  On amiodarone and Eliquis for anticoagulation.  Monitor on telemetry.  On rate control with metoprolol.  Heart rate ranging from 100-120.  Continue to monitor.  Started on Cardizem drip, now stopped due to hypotension.  EKG done today showed sinus tachycardia. ? ?Hypotension: Started on midodrine. ? ?Hyponatremia: Improved,Monitor ? ?Hypothyroidism: Takes Synthyroid.  Elevated TSH of 12.37,elevated free T4.  Discrepancy in the results.  Check TFT in 4 to 6 weeks.  No change will be made to the Synthyroid dose ? ?Hyperlipidemia: On statin ? ?Depression: On Zoloft ? ?Chronic right pleural effusion: No respiratory symptoms.  Continue to monitor ? ?History of non-Hodgkin's lymphoma: Status postchemotherapy. ? ?Edema/erythema of right forearm: No DVT or clots as per venous Doppler.  Looks like cellulitis.  Continue antibiotics ? ?Weakness/deconditioning: She has H/O stroke.lives at [facility].We requested  for PT/OT evaluation,recommedned SNF on dc.  Complains of severe weakness.  Currently from [facility].  TOC consulted ? ? ? ? ?Nutrition Problem: Inadequate oral intake ?Etiology: poor appetite ?  ? ?DVT prophylaxis: ?apixaban (ELIQUIS) tablet 5 mg  ? ?  Code Status: DNR ? ?Family Communication: called and discussed with sister on phone on 4/9 ? ?Patient status: Inpatient ? ?Patient is from : Skilled nursing facility ? ?Anticipated discharge to: Skilled nursing facility ? ?Estimated DC date: likely tomorrow.  Patient is still volume overloaded, requiring IV Lasix. ? ? ?Consultants: None ? ? ?Procedures: None ? ?Antimicrobials:  ?Anti-infectives (From admission, onward)  ? ? Start     Dose/Rate Route Frequency Ordered Stop  ? 12/29/21 1400  cephALEXin (KEFLEX) capsule 500  mg       ? 500 mg Oral Every 8 hours 12/29/21 0914    ? 12/26/21 2100  meropenem (MERREM) 2 g in sodium chloride 0.9 % 100 mL IVPB  Status:  Discontinued       ? 2 g ?280 mL/hr over 30 Minutes Intravenous Every 8 hours 12/26/21 2038 12/29/21 0914  ?  12/26/21 1945  piperacillin-tazobactam (ZOSYN) IVPB 3.375 g  Status:  Discontinued       ? 3.375 g ?100 mL/hr over 30 Minutes Intravenous Every 6 hours 12/26/21 1944 12/26/21 2038  ? ?  ? ? ?Subjective: ? ?Patient seen and examined at the bedside this morning.  Blood pressure was low, she was in mild sinus tachycardia.  She denies any new complaints.  Lower extremity edema getting better.  She was eager to know when she will be discharged ?Marland Kitchen ?Objective: ?Vitals:  ? 12/30/21 0000 12/30/21 0140 12/30/21 0400 12/30/21 0819  ?BP: 110/86  101/79 98/75  ?Pulse: (!) 112  (!) 102 (!) 102  ?Resp: 19  20 20   ?Temp: 97.7 ?F (36.5 ?C)  97.8 ?F (36.6 ?C) 97.6 ?F (36.4 ?C)  ?TempSrc: Axillary  Oral Oral  ?SpO2: 94% (!) 78% 90% 94%  ?Weight:      ?Height:      ? ? ?Intake/Output Summary (Last 24 hours) at 12/30/2021 1053 ?Last data filed at 12/30/2021 0600 ?Gross per 24 hour  ?Intake 631.98 ml  ?Output 2200 ml  ?Net -1568.02 ml  ? ?Filed Weights  ? 12/26/21 2008 12/27/21 1949 12/29/21 0600  ?Weight: 85 kg 89.8 kg 86.5 kg  ? ? ?Examination: ? ?General exam: Overall comfortable, not in distress, very deconditioned elderly female ?HEENT: PERRL ?Respiratory system:  no wheezes or crackles  ?Cardiovascular system: S1 & S2 heard, RRR.  ?Gastrointestinal system: Abdomen is nondistended, soft and nontender. ?Central nervous system: Alert and oriented ?Extremities: 1-2+ bilateral lower extremity edema, no clubbing ,no cyanosis ?Skin: No rashes, no ulcers,no icterus   ? ? ?Data Reviewed: I have personally reviewed following labs and imaging studies ? ?CBC: ?Recent Labs  ?Lab 12/26/21 ?1637 12/27/21 ?9030 12/28/21 ?0139  ?WBC 13.6* 12.6* 10.1  ?NEUTROABS 12.5*  --  8.8*  ?HGB 12.4 11.4* 11.7*  ?HCT 40.8 37.5 37.6  ?MCV 105.2* 105.6* 104.2*  ?PLT 212 178 165  ? ?Basic Metabolic Panel: ?Recent Labs  ?Lab 12/26/21 ?1637 12/27/21 ?0923 12/28/21 ?0139 12/29/21 ?0129 12/30/21 ?0813  ?NA 131* 132* 134* 134* 134*  ?K 4.4 4.6 5.4* 3.7 3.6  ?CL 99 107 106  104 99  ?CO2 25 20* 21* 22 27  ?GLUCOSE 101* 89 73 103* 81  ?BUN 10 10 11 14 13   ?CREATININE 1.02* 0.96 0.90 0.90 0.81  ?CALCIUM 8.3* 7.6* 7.9* 8.2* 7.9*  ? ? ? ?Recent Results (from the past 240 hour(s))  ?Urine Culture     Status: Abnormal  ? Collection Time: 12/26/21  6:10 PM  ? Specimen: Urine, Clean Catch  ?Result Value Ref Range Status  ? Specimen Description URINE, CLEAN CATCH  Final  ? Special Requests   Final  ?  NONE ?Performed at [facility], [place] [address], [place], [place] [address] ?  ? Culture >=100,000 COLONIES/mL ESCHERICHIA COLI (A)  Final  ? Report Status 12/29/2021 FINAL  Final  ? Organism ID, Bacteria ESCHERICHIA COLI (A)  Final  ?    Susceptibility  ? Escherichia coli - MIC*  ?  AMPICILLIN <=2 SENSITIVE Sensitive   ?  CEFAZOLIN <=4 SENSITIVE Sensitive   ?  CEFEPIME <=0.12 SENSITIVE Sensitive   ?  CEFTRIAXONE <=0.25 SENSITIVE Sensitive   ?  CIPROFLOXACIN <=0.25 SENSITIVE Sensitive   ?  GENTAMICIN <=1 SENSITIVE Sensitive   ?  IMIPENEM <=0.25 SENSITIVE Sensitive   ?  NITROFURANTOIN <=16 SENSITIVE Sensitive   ?  TRIMETH/SULFA <=20 SENSITIVE Sensitive   ?  AMPICILLIN/SULBACTAM <=2 SENSITIVE Sensitive   ?  PIP/TAZO <=4 SENSITIVE Sensitive   ?  * >=100,000 COLONIES/mL ESCHERICHIA COLI  ?Culture, blood (x 2)     Status: None (Preliminary result)  ? Collection Time: 12/26/21  8:27 PM  ? Specimen: BLOOD  ?Result Value Ref Range Status  ? Specimen Description BLOOD RIGHT ANTECUBITAL  Final  ? Special Requests   Final  ?  BOTTLES DRAWN AEROBIC AND ANAEROBIC Blood Culture results may not be optimal due to an excessive volume of blood received in culture bottles  ? Culture   Final  ?  NO GROWTH 3 DAYS ?Performed at [facility], [place] [address], [place], [place] [address] ?  ? Report Status PENDING  Incomplete  ?Culture, blood (x 2)     Status: None (Preliminary result)  ? Collection Time: 12/26/21  9:24 PM  ? Specimen: BLOOD  ?Result Value Ref Range Status  ? Specimen Description BLOOD BLOOD  LEFT HAND  Final  ? Special Requests   Final  ?  BOTTLES DRAWN AEROBIC ONLY Blood Culture results may not be optimal due to an inadequate volume of blood received in culture bottles  ? Culture   Final  ?

## 2021-12-31 DIAGNOSIS — N3000 Acute cystitis without hematuria: Secondary | ICD-10-CM | POA: Diagnosis not present

## 2021-12-31 DIAGNOSIS — Z20822 Contact with and (suspected) exposure to covid-19: Secondary | ICD-10-CM | POA: Diagnosis not present

## 2021-12-31 DIAGNOSIS — I484 Atypical atrial flutter: Secondary | ICD-10-CM | POA: Diagnosis not present

## 2021-12-31 DIAGNOSIS — I5033 Acute on chronic diastolic (congestive) heart failure: Secondary | ICD-10-CM | POA: Diagnosis not present

## 2021-12-31 LAB — CULTURE, BLOOD (ROUTINE X 2)
Culture: NO GROWTH
Culture: NO GROWTH

## 2021-12-31 LAB — CBC WITH DIFFERENTIAL/PLATELET
Abs Immature Granulocytes: 0.05 10*3/uL (ref 0.00–0.07)
Basophils Absolute: 0 10*3/uL (ref 0.0–0.1)
Basophils Relative: 1 %
Eosinophils Absolute: 0.2 10*3/uL (ref 0.0–0.5)
Eosinophils Relative: 2 %
HCT: 37.1 % (ref 36.0–46.0)
Hemoglobin: 12.2 g/dL (ref 12.0–15.0)
Immature Granulocytes: 1 %
Lymphocytes Relative: 6 %
Lymphs Abs: 0.5 10*3/uL — ABNORMAL LOW (ref 0.7–4.0)
MCH: 32.9 pg (ref 26.0–34.0)
MCHC: 32.9 g/dL (ref 30.0–36.0)
MCV: 100 fL (ref 80.0–100.0)
Monocytes Absolute: 1 10*3/uL (ref 0.1–1.0)
Monocytes Relative: 13 %
Neutro Abs: 5.7 10*3/uL (ref 1.7–7.7)
Neutrophils Relative %: 77 %
Platelets: 145 10*3/uL — ABNORMAL LOW (ref 150–400)
RBC: 3.71 MIL/uL — ABNORMAL LOW (ref 3.87–5.11)
RDW: 17.9 % — ABNORMAL HIGH (ref 11.5–15.5)
WBC: 7.4 10*3/uL (ref 4.0–10.5)
nRBC: 0 % (ref 0.0–0.2)

## 2021-12-31 LAB — BASIC METABOLIC PANEL
Anion gap: 8 (ref 5–15)
BUN: 16 mg/dL (ref 8–23)
CO2: 33 mmol/L — ABNORMAL HIGH (ref 22–32)
Calcium: 8.1 mg/dL — ABNORMAL LOW (ref 8.9–10.3)
Chloride: 94 mmol/L — ABNORMAL LOW (ref 98–111)
Creatinine, Ser: 0.91 mg/dL (ref 0.44–1.00)
GFR, Estimated: >60 mL/min (ref >60)
Glucose, Bld: 94 mg/dL (ref 70–99)
Potassium: 3.1 mmol/L — ABNORMAL LOW (ref 3.5–5.1)
Sodium: 135 mmol/L (ref 135–145)

## 2021-12-31 LAB — MAGNESIUM: Magnesium: 1.7 mg/dL (ref 1.7–2.4)

## 2021-12-31 MED ORDER — SODIUM CHLORIDE 0.9 % IV SOLN: INTRAVENOUS | Status: DC

## 2021-12-31 MED ORDER — POTASSIUM CHLORIDE CRYS ER 20 MEQ PO TBCR
40.0000 meq | EXTENDED_RELEASE_TABLET | ORAL | Status: AC
  Administered 2021-12-31 (×2): 40 meq via ORAL
  Filled 2021-12-31 (×2): qty 2

## 2021-12-31 NOTE — Progress Notes (Signed)
Ekg was done showing sinus tachycardia , charge nurse notified ?

## 2021-12-31 NOTE — Progress Notes (Signed)
?PROGRESS NOTE ? ?Kayla Harrison  EYC:[id number] DOB: 11-Jul-1946 DOA: 12/26/2021 ?PCP: [name], MD  ? ?Brief Narrative: ? ?Patient is a 76 year old female with history of hypothyroidism, hyperlipidemia, hypertension, non Hodgkins lymphoma, chronic A-fib on Eliquis, ESBL UTI who presented from [facility] with complaint of generalized weakness, decreased oral intake, fall , increased urinary frequency, urgency.  There was also report of 45 pounds weight loss in last 4 months.  On presentation, she was in A-fib with RVR.  Chest x-ray showed hazy opacity throughout the right thorax consistent with layering pleural effusion slightly increased from prior.  UA was suspicious for UTI.  Urine culture, blood culture sent.  Patient was admitted for the management of sepsis secondary to UTI.  Hospital course remarkable for persistent tachycardia/a flutter.  Cardiology planning for cardioversion tomorrow ? ?Assessment & Plan: ? ?Principal Problem: ?  UTI (urinary tract infection) ?Active Problems: ?  Sepsis due to gram-negative UTI ([place]) ?  AKI (acute kidney injury) ([place]) ?  Chronic atrial fibrillation with RVR (HCC) ?  Hypothyroidism ?  Hyponatremia ?  Dyslipidemia ?  Depression ?  Pleural effusion on right ?  Non Hodgkin's lymphoma ([place]) ? ?Sepsis secondary to UTI: Patient has history of ESBL UTI.  Presented with increased urgency, dysuria.  Lab work showed leukocytosis.  Started on meropenem due to history of ESBL UTI.  Urine culture showed pansensitive E. coli, antibiotics changed to Keflex .Blood culture no growth till date ? ?Chronic A-fib: Presented with RVR.  On amiodarone and Eliquis for anticoagulation.  Monitor on telemetry.  On rate control with metoprolol.  Heart rate ranging from 100-120.  Started on Cardizem drip, now stopped due to hypotension.  EKG shows sinus tachycardia but most likely it is A-flutter/A-fib.  Cardiology planning for cardioversion tomorrow.  Started on digoxin ? ?Chronic diastolic  congestive heart failure: Last echo done on 2/23 showed EF of 55 to 60%.  Since she was volume overloaded, started on Lasix IV.Lasix changed to oral ? ?AKI: Resolved with IV fluids ? ?Hypotension: Started on midodrine. ? ?Hypokalemia: Supplemented with potassium ? ?Hypothyroidism: Takes Synthyroid.  Elevated TSH of 12.37,elevated free T4.  Discrepancy in the results.  Check TFT in 4 to 6 weeks.  No change will be made to the Synthyroid dose ? ?Hyperlipidemia: On statin ? ?Depression: On Zoloft ? ?Chronic right pleural effusion: No respiratory symptoms.  Continue to monitor ? ?History of non-Hodgkin's lymphoma: Status postchemotherapy. ? ?Edema/erythema of right forearm: No DVT or clots as per venous Doppler.  Looked like cellulitis.  Significantly improved ? ?Weakness/deconditioning: She has H/O stroke.lives at [facility].We requested  for PT/OT evaluation,recommedned SNF on dc.  Complains of severe weakness.  Currently from [facility].  TOC consulted ? ? ? ? ?Nutrition Problem: Inadequate oral intake ?Etiology: poor appetite ?  ? ?DVT prophylaxis: ?apixaban (ELIQUIS) tablet 5 mg  ? ?  Code Status: DNR ? ?Family Communication: called and discussed with sister on phone on 4/9 ? ?Patient status: Inpatient ? ?Patient is from : Skilled nursing facility ? ?Anticipated discharge to: Skilled nursing facility ? ?Estimated DC date: likely tomorrow after cardioversion ? ? ?Consultants: Cardiology ? ? ?Procedures: None ? ?Antimicrobials:  ?Anti-infectives (From admission, onward)  ? ? Start     Dose/Rate Route Frequency Ordered Stop  ? 12/29/21 1400  cephALEXin (KEFLEX) capsule 500 mg       ? 500 mg Oral Every 8 hours 12/29/21 0914    ? 12/26/21 2100  meropenem (MERREM) 2 g  in sodium chloride 0.9 % 100 mL IVPB  Status:  Discontinued       ? 2 g ?280 mL/hr over 30 Minutes Intravenous Every 8 hours 12/26/21 2038 12/29/21 0914  ? 12/26/21 1945  piperacillin-tazobactam (ZOSYN) IVPB 3.375 g  Status:  Discontinued       ? 3.375 g ?100  mL/hr over 30 Minutes Intravenous Every 6 hours 12/26/21 1944 12/26/21 2038  ? ?  ? ? ?Subjective: ? ?Patient seen and examined at the bedside this morning.  Hemodynamically stable but heart rate in the range of 110-120.  She does not have any complaints.  Says she feels better.  Blood pressure better today ?Objective: ?Vitals:  ? 12/31/21 0400 12/31/21 0500 12/31/21 0700 12/31/21 0937  ?BP: 115/81   (!) 127/92  ?Pulse: (!) 106 (!) 113  (!) 112  ?Resp: 15 (!) 23 20 20   ?Temp:    97.7 ?F (36.5 ?C)  ?TempSrc:    Oral  ?SpO2: 94% 96%  94%  ?Weight:      ?Height:      ? ? ?Intake/Output Summary (Last 24 hours) at 12/31/2021 1117 ?Last data filed at 12/31/2021 0350 ?Gross per 24 hour  ?Intake --  ?Output 1850 ml  ?Net -1850 ml  ? ?Filed Weights  ? 12/26/21 2008 12/27/21 1949 12/29/21 0600  ?Weight: 85 kg 89.8 kg 86.5 kg  ? ? ?Examination: ? ?General exam: Overall comfortable, not in distress, very deconditioned, weak ?HEENT: PERRL ?Respiratory system:  no wheezes or crackles  ?Cardiovascular system: Sinus tachycardia ?Gastrointestinal system: Abdomen is nondistended, soft and nontender. ?Central nervous system: Alert and oriented ?Extremities: Trace bilateral lower extremity edema, no clubbing ,no cyanosis ?Skin: No rashes, no ulcers,no icterus   ? ? ?Data Reviewed: I have personally reviewed following labs and imaging studies ? ?CBC: ?Recent Labs  ?Lab 12/26/21 ?1637 12/27/21 ?6967 12/28/21 ?0139 12/31/21 ?0245  ?WBC 13.6* 12.6* 10.1 7.4  ?NEUTROABS 12.5*  --  8.8* 5.7  ?HGB 12.4 11.4* 11.7* 12.2  ?HCT 40.8 37.5 37.6 37.1  ?MCV 105.2* 105.6* 104.2* 100.0  ?PLT 212 178 165 145*  ? ?Basic Metabolic Panel: ?Recent Labs  ?Lab 12/27/21 ?8938 12/28/21 ?0139 12/29/21 ?0129 12/30/21 ?0813 12/31/21 ?0245  ?NA 132* 134* 134* 134* 135  ?K 4.6 5.4* 3.7 3.6 3.1*  ?CL 107 106 104 99 94*  ?CO2 20* 21* 22 27 33*  ?GLUCOSE 89 73 103* 81 94  ?BUN 10 11 14 13 16   ?CREATININE 0.96 0.90 0.90 0.81 0.91  ?CALCIUM 7.6* 7.9* 8.2* 7.9* 8.1*  ?MG   --   --   --   --  1.7  ? ? ? ?Recent Results (from the past 240 hour(s))  ?Urine Culture     Status: Abnormal  ? Collection Time: 12/26/21  6:10 PM  ? Specimen: Urine, Clean Catch  ?Result Value Ref Range Status  ? Specimen Description URINE, CLEAN CATCH  Final  ? Special Requests   Final  ?  NONE ?Performed at [facility], [place] [address] [place],  [address] ?  ? Culture >=100,000 COLONIES/mL ESCHERICHIA COLI (A)  Final  ? Report Status 12/29/2021 FINAL  Final  ? Organism ID, Bacteria ESCHERICHIA COLI (A)  Final  ?    Susceptibility  ? Escherichia coli - MIC*  ?  AMPICILLIN <=2 SENSITIVE Sensitive   ?  CEFAZOLIN <=4 SENSITIVE Sensitive   ?  CEFEPIME <=0.12 SENSITIVE Sensitive   ?  CEFTRIAXONE <=0.25 SENSITIVE Sensitive   ?  CIPROFLOXACIN <=0.25 SENSITIVE Sensitive   ?  GENTAMICIN <=1 SENSITIVE Sensitive   ?  IMIPENEM <=0.25 SENSITIVE Sensitive   ?  NITROFURANTOIN <=16 SENSITIVE Sensitive   ?  TRIMETH/SULFA <=20 SENSITIVE Sensitive   ?  AMPICILLIN/SULBACTAM <=2 SENSITIVE Sensitive   ?  PIP/TAZO <=4 SENSITIVE Sensitive   ?  * >=100,000 COLONIES/mL ESCHERICHIA COLI  ?Culture, blood (x 2)     Status: None (Preliminary result)  ? Collection Time: 12/26/21  8:27 PM  ? Specimen: BLOOD  ?Result Value Ref Range Status  ? Specimen Description BLOOD RIGHT ANTECUBITAL  Final  ? Special Requests   Final  ?  BOTTLES DRAWN AEROBIC AND ANAEROBIC Blood Culture results may not be optimal due to an excessive volume of blood received in culture bottles  ? Culture   Final  ?  NO GROWTH 4 DAYS ?Performed at [facility], [place] [address] [place], [place] [address] ?  ? Report Status PENDING  Incomplete  ?Culture, blood (x 2)     Status: None (Preliminary result)  ? Collection Time: 12/26/21  9:24 PM  ? Specimen: BLOOD  ?Result Value Ref Range Status  ? Specimen Description BLOOD BLOOD LEFT HAND  Final  ? Special Requests   Final  ?  BOTTLES DRAWN AEROBIC ONLY Blood Culture results may not be optimal due to an  inadequate volume of blood received in culture bottles  ? Culture   Final  ?  NO GROWTH 4 DAYS ?Performed at [facility], [place] [address] [name],  [address] ?  ? Report Status PENDING  Incomplete

## 2021-12-31 NOTE — Progress Notes (Signed)
PT Cancellation Note ? ?Patient Details ?Name: Kayla Harrison ?MRN: [id number] ?DOB: Nov 27, 1945 ? ? ?Cancelled Treatment:    Reason Eval/Treat Not Completed: Patient at procedure or test/unavailable ? ?Patient having frequent diarrhea. Attempted to see x 2 this pm with pt being cleaned or needing to be cleaned each time. ? ? ?Arby Barrette, PT ?Acute Rehabilitation Services  ?Pager [phone] ?Office [phone] ? ? ?[name] ?12/31/2021, 3:24 PM ?

## 2021-12-31 NOTE — Progress Notes (Signed)
?Cardiology Progress Note  ?Patient ID: Kayla Harrison ?MRN: [id number] ?DOB: 1946/08/31 ?Date of Encounter: 12/31/2021 ? ?Primary Cardiologist: [name], MD ? ?Subjective  ? ?Chief Complaint: none.  ? ?HPI: Remains in likely atypical flutter.  Denies any chest pain or trouble breathing.  Potassium low. ? ?ROS:  ?All other ROS reviewed and negative. Pertinent positives noted in the HPI.    ? ?Inpatient Medications  ?Scheduled Meds: ? (feeding supplement) PROSource Plus  30 mL Oral BID BM  ? amiodarone  200 mg Oral BID  ? apixaban  5 mg Oral BID  ? calcium-vitamin D  1 tablet Oral Q breakfast  ? cephALEXin  500 mg Oral Q8H  ? cycloSPORINE  1 drop Both Eyes Daily  ? digoxin  0.125 mg Oral Daily  ? feeding supplement  237 mL Oral BID BM  ? levothyroxine  125 mcg Oral Q0600  ? lidocaine  2 patch Transdermal QODAY  ? magnesium oxide  400 mg Oral Daily  ? metoprolol succinate  50 mg Oral Daily  ? midodrine  10 mg Oral TID WC  ? multivitamin with minerals  1 tablet Oral Daily  ? Muscle Rub  1 application. Topical Daily  ? potassium chloride  40 mEq Oral Q4H  ? rosuvastatin  10 mg Oral Once per day on Mon Thu  ? ?Continuous Infusions: ? sodium chloride    ? ?PRN Meds: ?HYDROcodone-acetaminophen  ? ?Vital Signs  ? ?Vitals:  ? 12/31/21 0349 12/31/21 0400 12/31/21 0500 12/31/21 0700  ?BP: 115/80 115/81    ?Pulse: (!) 114 (!) 106 (!) 113   ?Resp: 20 15 (!) 23 20  ?Temp: 97.8 ?F (36.6 ?C)     ?TempSrc: Oral     ?SpO2: 95% 94% 96%   ?Weight:      ?Height:      ? ? ?Intake/Output Summary (Last 24 hours) at 12/31/2021 0904 ?Last data filed at 12/31/2021 0350 ?Gross per 24 hour  ?Intake --  ?Output 1850 ml  ?Net -1850 ml  ? ? ?  12/29/2021  ?  6:00 AM 12/27/2021  ?  7:49 PM 12/26/2021  ?  8:08 PM  ?Last 3 Weights  ?Weight (lbs) 190 lb 11.2 oz 197 lb 15.6 oz 187 lb 6.3 oz  ?Weight (kg) 86.5 kg 89.8 kg 85 kg  ?   ? ?Telemetry  ?Overnight telemetry shows likely atrial flutter heart rate 114, which I personally reviewed.  ? ?Physical Exam   ? ?Vitals:  ? 12/31/21 0349 12/31/21 0400 12/31/21 0500 12/31/21 0700  ?BP: 115/80 115/81    ?Pulse: (!) 114 (!) 106 (!) 113   ?Resp: 20 15 (!) 23 20  ?Temp: 97.8 ?F (36.6 ?C)     ?TempSrc: Oral     ?SpO2: 95% 94% 96%   ?Weight:      ?Height:      ?  ?Intake/Output Summary (Last 24 hours) at 12/31/2021 0904 ?Last data filed at 12/31/2021 0350 ?Gross per 24 hour  ?Intake --  ?Output 1850 ml  ?Net -1850 ml  ?  ? ?  12/29/2021  ?  6:00 AM 12/27/2021  ?  7:49 PM 12/26/2021  ?  8:08 PM  ?Last 3 Weights  ?Weight (lbs) 190 lb 11.2 oz 197 lb 15.6 oz 187 lb 6.3 oz  ?Weight (kg) 86.5 kg 89.8 kg 85 kg  ?  Body mass index is 30.78 kg/m?.  ?General: Well nourished, well developed, in no acute distress ?Head: Atraumatic, normal size  ?Eyes:  PEERLA, EOMI  ?Neck: Supple, no JVD ?Endocrine: No thryomegaly ?Cardiac: Normal S1, S2; tachycardia, no murmurs ?Lungs: Clear to auscultation bilaterally, no wheezing, rhonchi or rales  ?Abd: Soft, nontender, no hepatomegaly  ?Ext: Trace edema ?Musculoskeletal: No deformities, BUE and BLE strength normal and equal ?Skin: Warm and dry, no rashes   ?Neuro: Alert and oriented to person, place, time, and situation, CNII-XII grossly intact, no focal deficits  ?Psych: Normal mood and affect  ? ?Labs  ?High Sensitivity Troponin:  No results for input(s): TROPONINIHS in the last 720 hours.   ?Cardiac EnzymesNo results for input(s): TROPONINI in the last 168 hours. No results for input(s): TROPIPOC in the last 168 hours.  ?Chemistry ?Recent Labs  ?Lab 12/26/21 ?1637 12/27/21 ?5573 12/29/21 ?0129 12/30/21 ?0813 12/31/21 ?0245  ?NA 131*   < > 134* 134* 135  ?K 4.4   < > 3.7 3.6 3.1*  ?CL 99   < > 104 99 94*  ?CO2 25   < > 22 27 33*  ?GLUCOSE 101*   < > 103* 81 94  ?BUN 10   < > 14 13 16   ?CREATININE 1.02*   < > 0.90 0.81 0.91  ?CALCIUM 8.3*   < > 8.2* 7.9* 8.1*  ?PROT 5.2*  --   --   --   --   ?ALBUMIN 2.8*  --   --   --   --   ?AST 29  --   --   --   --   ?ALT 25  --   --   --   --   ?ALKPHOS 154*  --   --    --   --   ?BILITOT 1.3*  --   --   --   --   ?GFRNONAA 57*   < > >60 >60 >60  ?ANIONGAP 7   < > 8 8 8   ? < > = values in this interval not displayed.  ?  ?Hematology ?Recent Labs  ?Lab 12/27/21 ?2202 12/28/21 ?0139 12/31/21 ?0245  ?WBC 12.6* 10.1 7.4  ?RBC 3.55* 3.61* 3.71*  ?HGB 11.4* 11.7* 12.2  ?HCT 37.5 37.6 37.1  ?MCV 105.6* 104.2* 100.0  ?MCH 32.1 32.4 32.9  ?MCHC 30.4 31.1 32.9  ?RDW 19.0* 18.6* 17.9*  ?PLT 178 165 145*  ? ?BNP ?Recent Labs  ?Lab 12/28/21 ?5427  ?BNP 1,019.7*  ?  ?DDimer No results for input(s): DDIMER in the last 168 hours.  ? ?Radiology  ?No results found. ? ?Cardiac Studies  ?TTE 11/13/2021 ? 1. Left ventricular ejection fraction, by estimation, is 55 to 60%. The  ?left ventricle has normal function. The left ventricle has no regional  ?wall motion abnormalities. Indeterminate diastolic filling due to E-A  ?fusion. There is the interventricular  ?septum is flattened in diastole ('D' shaped left ventricle), consistent  ?with right ventricular volume overload.  ? 2. Right ventricular systolic function is mildly reduced. The right  ?ventricular size is mildly enlarged. There is mildly elevated pulmonary  ?artery systolic pressure.  ? 3. Left atrial size was moderately dilated.  ? 4. The mitral valve is degenerative. Moderate mitral valve regurgitation.  ?Mild mitral stenosis. The mean mitral valve gradient is 5.0 mmHg with  ?average heart rate of 122 bpm. Severe mitral annular calcification.  ? 5. Tricuspid valve regurgitation is severe.  ? 6. The aortic valve is normal in structure. There is mild calcification  ?of the aortic valve. There is mild thickening of the aortic valve. Aortic  ?  valve regurgitation is not visualized. Aortic valve sclerosis is present,  ?with no evidence of aortic valve  ?stenosis.  ? 7. The inferior vena cava is dilated in size with >50% respiratory  ?variability, suggesting right atrial pressure of 8 mmHg.  ? ?Patient Profile  ?76 year old female with hypertension,  hyperlipidemia, non-Hodgkin's lymphoma, persistent atrial fibrillation status post ablation in 2008/2014, complete heart block requiring permanent pacemaker, stroke, orthostatic hypotension, lung cancer status post chemoradiation, degenerative mitral valve disease who was admitted on 12/26/2021 with weakness and fatigue and found to have urinary tract infection.  Cardiology consulted on 12/30/2021 for tachycardia and decompensated diastolic heart failure. ? ?Assessment & Plan  ? ?#Tachycardia, likely atypical flutter ?#Atrial fibrillation status post ablation ?-Rhythm represents possible atypical atrial flutter after discussion with her primary electrophysiologist.  Quite different from sinus rhythm. ?-On Eliquis 5 mg twice daily.  No missed doses. ?-We have plans for cardioversion but the schedule does not allow this today.  We will plan for cardioversion tomorrow at 9 AM. ?-In the interim digoxin 0.125 mg daily has been added.  We will continue this given soft blood pressure and orthostatic hypotension requiring midodrine. ?-Also on amiodarone at home for rate control. ? ?#Chronic diastolic heart failure ?#Moderate calcific mitral stenosis ?#Moderate calcific mitral valve regurgitation ?-Potassium low.  Trace edema on exam.  Would recommend Lasix 40 mg p.o. daily as needed.  She does not need any more IV diuresis. ? ?#Orthostatic hypotension ?-Chronic issue.  On midodrine. ? ?For questions or updates, please contact [place] ?Please consult [email or web address] for contact info under  ?   ?Signed, ?[place] T. Audie Box, MD, [facility] ?[place]  ?12/31/2021 9:04 AM  ? ?

## 2022-01-01 ENCOUNTER — Encounter (HOSPITAL_COMMUNITY)
Admission: EM | Disposition: A | Discharge status: Skilled Nursing Facility | Payer: Self-pay | Source: Home / Self Care | Attending: Internal Medicine

## 2022-01-01 ENCOUNTER — Encounter (HOSPITAL_COMMUNITY): Payer: Self-pay | Admitting: Internal Medicine

## 2022-01-01 ENCOUNTER — Inpatient Hospital Stay (HOSPITAL_COMMUNITY): Payer: Medicare Other | Admitting: General Practice

## 2022-01-01 ENCOUNTER — Ambulatory Visit (INDEPENDENT_AMBULATORY_CARE_PROVIDER_SITE_OTHER): Payer: Medicare Other

## 2022-01-01 DIAGNOSIS — I272 Pulmonary hypertension, unspecified: Secondary | ICD-10-CM

## 2022-01-01 DIAGNOSIS — I340 Nonrheumatic mitral (valve) insufficiency: Secondary | ICD-10-CM

## 2022-01-01 DIAGNOSIS — I10 Essential (primary) hypertension: Secondary | ICD-10-CM

## 2022-01-01 DIAGNOSIS — I484 Atypical atrial flutter: Secondary | ICD-10-CM

## 2022-01-01 DIAGNOSIS — I5031 Acute diastolic (congestive) heart failure: Secondary | ICD-10-CM

## 2022-01-01 DIAGNOSIS — I4892 Unspecified atrial flutter: Secondary | ICD-10-CM

## 2022-01-01 DIAGNOSIS — I4819 Other persistent atrial fibrillation: Secondary | ICD-10-CM

## 2022-01-01 HISTORY — PX: CARDIOVERSION: SHX1299

## 2022-01-01 LAB — CUP PACEART REMOTE DEVICE CHECK
Battery Remaining Longevity: 20 mo
Battery Voltage: 2.92 V
Brady Statistic AP VP Percent: 4.64 %
Brady Statistic AP VS Percent: 4.44 %
Brady Statistic AS VP Percent: 6.49 %
Brady Statistic AS VS Percent: 84.43 %
Brady Statistic RA Percent Paced: 7.55 %
Brady Statistic RV Percent Paced: 9.73 %
Date Time Interrogation Session: 20230411085621
Implantable Lead Implant Date: 20140618
Implantable Lead Implant Date: 20140618
Implantable Lead Location: 753859
Implantable Lead Location: 753860
Implantable Pulse Generator Implant Date: 20140618
Lead Channel Impedance Value: 1045 Ohm
Lead Channel Impedance Value: 361 Ohm
Lead Channel Impedance Value: 437 Ohm
Lead Channel Impedance Value: 969 Ohm
Lead Channel Pacing Threshold Amplitude: 0.875 V
Lead Channel Pacing Threshold Amplitude: 1.125 V
Lead Channel Pacing Threshold Pulse Width: 0.4 ms
Lead Channel Pacing Threshold Pulse Width: 0.4 ms
Lead Channel Sensing Intrinsic Amplitude: 1.5 mV
Lead Channel Sensing Intrinsic Amplitude: 1.5 mV
Lead Channel Sensing Intrinsic Amplitude: 11.875 mV
Lead Channel Sensing Intrinsic Amplitude: 11.875 mV
Lead Channel Setting Pacing Amplitude: 2.5 V
Lead Channel Setting Pacing Amplitude: 2.5 V
Lead Channel Setting Pacing Pulse Width: 0.4 ms
Lead Channel Setting Sensing Sensitivity: 0.9 mV

## 2022-01-01 LAB — BASIC METABOLIC PANEL
Anion gap: 7 (ref 5–15)
BUN: 17 mg/dL (ref 8–23)
CO2: 30 mmol/L (ref 22–32)
Calcium: 8.2 mg/dL — ABNORMAL LOW (ref 8.9–10.3)
Chloride: 97 mmol/L — ABNORMAL LOW (ref 98–111)
Creatinine, Ser: 0.79 mg/dL (ref 0.44–1.00)
GFR, Estimated: >60 mL/min (ref >60)
Glucose, Bld: 88 mg/dL (ref 70–99)
Potassium: 4.3 mmol/L (ref 3.5–5.1)
Sodium: 134 mmol/L — ABNORMAL LOW (ref 135–145)

## 2022-01-01 LAB — PROTIME-INR
INR: 2 — ABNORMAL HIGH (ref 0.8–1.2)
Prothrombin Time: 22.1 seconds — ABNORMAL HIGH (ref 11.4–15.2)

## 2022-01-01 SURGERY — CARDIOVERSION
Anesthesia: General

## 2022-01-01 MED ORDER — FLUDROCORTISONE ACETATE 0.1 MG PO TABS: 0.1000 mg | ORAL_TABLET | Freq: Every day | ORAL | Status: DC

## 2022-01-01 MED ORDER — MAGNESIUM SULFATE IN D5W 1-5 GM/100ML-% IV SOLN
1.0000 g | Freq: Once | INTRAVENOUS | Status: AC
  Administered 2022-01-01: 1 g via INTRAVENOUS
  Filled 2022-01-01: qty 100

## 2022-01-01 MED ORDER — PROPOFOL 10 MG/ML IV BOLUS
INTRAVENOUS | Status: DC | PRN
  Administered 2022-01-01: 50 mg via INTRAVENOUS

## 2022-01-01 NOTE — Progress Notes (Addendum)
?PROGRESS NOTE ? ?Kayla Harrison  QJJ:[id number] DOB: Jan 02, 1946 DOA: 12/26/2021 ?PCP: [name], MD  ? ?Brief Narrative: ? ?Patient is a 76 year old female with history of hypothyroidism, hyperlipidemia, hypertension, non Hodgkins lymphoma, chronic A-fib on Eliquis, ESBL UTI who presented from [facility] with complaint of generalized weakness, decreased oral intake, fall , increased urinary frequency, urgency.  There was also report of 45 pounds weight loss in last 4 months.  On presentation, she was in A-fib with RVR.  Chest x-ray showed hazy opacity throughout the right thorax consistent with layering pleural effusion slightly increased from prior.  UA was suspicious for UTI.  Urine culture, blood culture sent.  Patient was admitted for the management of sepsis secondary to UTI.  Hospital course remarkable for persistent tachycardia/a flutter.  Underwent cardioversion today.  Plan for discharge to skilled nursing facility tomorrow. ? ?Assessment & Plan: ? ?Principal Problem: ?  UTI (urinary tract infection) ?Active Problems: ?  Sepsis due to gram-negative UTI ([place]) ?  AKI (acute kidney injury) ([place]) ?  Chronic atrial fibrillation with RVR ([facility]) ?  Hypothyroidism ?  Hyponatremia ?  Dyslipidemia ?  Depression ?  Pleural effusion on right ?  Non Hodgkin's lymphoma ([place]) ? ?Sepsis secondary to UTI: Patient has history of ESBL UTI.  Presented with increased urgency, dysuria.  Lab work showed leukocytosis.  Started on meropenem due to history of ESBL UTI.  Urine culture showed pansensitive E. coli, antibiotics changed to Keflex, completed course.Blood culture no growth till date ? ?Chronic A-fib: Presented with RVR.  On amiodarone and Eliquis for anticoagulation.  Monitor on telemetry.  On rate control with metoprolol.  Hospital course remarkable for persistent tachycardia. Started on digoxin.  Underwent cardioversion today ? ?Chronic diastolic congestive heart failure: Last echo done on 2/23 showed EF of 55  to 60%.  Since she was volume overloaded, started on Lasix IV.Lasix changed to oral, now stopped.  May need low-dose Lasix on discharge ? ?AKI: Resolved with IV fluids ? ?Hypotension: Started on midodrine.  If blood pressure remains stable, will taper the midodrine ? ?Hypothyroidism: Takes Synthyroid.  Elevated TSH of 12.37,elevated free T4.  Discrepancy in the results.  Check TFT in 4 to 6 weeks.  No change will be made to the Synthyroid dose ? ?Hyperlipidemia: On statin ? ?Depression: On Zoloft ? ?Chronic right pleural effusion: No respiratory symptoms.  Continue to monitor ? ?History of non-Hodgkin's lymphoma: Status postchemotherapy. ? ?Edema/erythema of right forearm: No DVT or clots as per venous Doppler.  Looked like cellulitis.  Significantly improved ? ?Weakness/deconditioning: She has H/O stroke.lives at [facility].Has intermittent confusion.We requested  for PT/OT evaluation,recommedned SNF on dc.  Complains of severe weakness.  Currently from [facility].  TOC consulted.  Plan for discharge tomorrow after cardiology clearance ? ? ? ? ?Nutrition Problem: Inadequate oral intake ?Etiology: poor appetite ?  ? ?DVT prophylaxis: ?apixaban (ELIQUIS) tablet 5 mg  ? ?  Code Status: DNR ? ?Family Communication: called and discussed with sister on phone on 4/9 ? ?Patient status: Inpatient ? ?Patient is from : Skilled nursing facility ? ?Anticipated discharge to: Skilled nursing facility ? ?Estimated DC date: likely tomorrow ? ? ?Consultants: Cardiology ? ? ?Procedures: None ? ?Antimicrobials:  ?Anti-infectives (From admission, onward)  ? ? Start     Dose/Rate Route Frequency Ordered Stop  ? 12/29/21 1400  cephALEXin (KEFLEX) capsule 500 mg       ? 500 mg Oral Every 8 hours 12/29/21 0914    ?  12/26/21 2100  meropenem (MERREM) 2 g in sodium chloride 0.9 % 100 mL IVPB  Status:  Discontinued       ? 2 g ?280 mL/hr over 30 Minutes Intravenous Every 8 hours 12/26/21 2038 12/29/21 0914  ? 12/26/21 1945  piperacillin-tazobactam  (ZOSYN) IVPB 3.375 g  Status:  Discontinued       ? 3.375 g ?100 mL/hr over 30 Minutes Intravenous Every 6 hours 12/26/21 1944 12/26/21 2038  ? ?  ? ? ?Subjective: ?Patient seen and examined at bedside this mrng. Heart rate well controlled,she was on NSR. She was lying on bed,being cleaned.Denies any complains ? ? ?Objective: ?Vitals:  ? 01/01/22 0903 01/01/22 0909 01/01/22 0917 01/01/22 1000  ?BP: 103/64 123/60 129/65 120/75  ?Pulse: 66 70 70 88  ?Resp: (!) 22 (!) 23 20 14   ?Temp:    97.7 ?F (36.5 ?C)  ?TempSrc:    Oral  ?SpO2: 91% 92% 95% 95%  ?Weight:      ?Height:      ? ? ?Intake/Output Summary (Last 24 hours) at 01/01/2022 1132 ?Last data filed at 01/01/2022 0854 ?Gross per 24 hour  ?Intake 340 ml  ?Output 700 ml  ?Net -360 ml  ? ?Filed Weights  ? 12/27/21 1949 12/29/21 0600 01/01/22 0819  ?Weight: 89.8 kg 86.5 kg 86.5 kg  ? ? ?Examination: ? ?General exam: Overall comfortable, not in distress ?HEENT: PERRL ?Respiratory system:  no wheezes or crackles  ?Cardiovascular system: S1 & S2 heard, RRR.  ?Gastrointestinal system: Abdomen is nondistended, soft and nontender. ?Central nervous system: Alert and awake,oriented to place only ?Extremities: No edema, no clubbing ,no cyanosis ?Skin: No rashes, no ulcers,no icterus   ? ? ?Data Reviewed: I have personally reviewed following labs and imaging studies ? ?CBC: ?Recent Labs  ?Lab 12/26/21 ?1637 12/27/21 ?1610 12/28/21 ?0139 12/31/21 ?0245  ?WBC 13.6* 12.6* 10.1 7.4  ?NEUTROABS 12.5*  --  8.8* 5.7  ?HGB 12.4 11.4* 11.7* 12.2  ?HCT 40.8 37.5 37.6 37.1  ?MCV 105.2* 105.6* 104.2* 100.0  ?PLT 212 178 165 145*  ? ?Basic Metabolic Panel: ?Recent Labs  ?Lab 12/28/21 ?0139 12/29/21 ?0129 12/30/21 ?0813 12/31/21 ?9604 01/01/22 ?0121  ?NA 134* 134* 134* 135 134*  ?K 5.4* 3.7 3.6 3.1* 4.3  ?CL 106 104 99 94* 97*  ?CO2 21* 22 27 33* 30  ?GLUCOSE 73 103* 81 94 88  ?BUN 11 14 13 16 17   ?CREATININE 0.90 0.90 0.81 0.91 0.79  ?CALCIUM 7.9* 8.2* 7.9* 8.1* 8.2*  ?MG  --   --   --  1.7   --   ? ? ? ?Recent Results (from the past 240 hour(s))  ?Urine Culture     Status: Abnormal  ? Collection Time: 12/26/21  6:10 PM  ? Specimen: Urine, Clean Catch  ?Result Value Ref Range Status  ? Specimen Description URINE, CLEAN CATCH  Final  ? Special Requests   Final  ?  NONE ?Performed at [facility], [place] [address], [place], [place] [address] ?  ? Culture >=100,000 COLONIES/mL ESCHERICHIA COLI (A)  Final  ? Report Status 12/29/2021 FINAL  Final  ? Organism ID, Bacteria ESCHERICHIA COLI (A)  Final  ?    Susceptibility  ? Escherichia coli - MIC*  ?  AMPICILLIN <=2 SENSITIVE Sensitive   ?  CEFAZOLIN <=4 SENSITIVE Sensitive   ?  CEFEPIME <=0.12 SENSITIVE Sensitive   ?  CEFTRIAXONE <=0.25 SENSITIVE Sensitive   ?  CIPROFLOXACIN <=0.25 SENSITIVE Sensitive   ?  GENTAMICIN <=1 SENSITIVE Sensitive   ?  IMIPENEM <=0.25 SENSITIVE Sensitive   ?  NITROFURANTOIN <=16 SENSITIVE Sensitive   ?  TRIMETH/SULFA <=20 SENSITIVE Sensitive   ?  AMPICILLIN/SULBACTAM <=2 SENSITIVE Sensitive   ?  PIP/TAZO <=4 SENSITIVE Sensitive   ?  * >=100,000 COLONIES/mL ESCHERICHIA COLI  ?Culture, blood (x 2)     Status: None  ? Collection Time: 12/26/21  8:27 PM  ? Specimen: BLOOD  ?Result Value Ref Range Status  ? Specimen Description BLOOD RIGHT ANTECUBITAL  Final  ? Special Requests   Final  ?  BOTTLES DRAWN AEROBIC AND ANAEROBIC Blood Culture results may not be optimal due to an excessive volume of blood received in culture bottles  ? Culture   Final  ?  NO GROWTH 5 DAYS ?Performed at [facility], [place] [address] [place], [place] [address] ?  ? Report Status 12/31/2021 FINAL  Final  ?Culture, blood (x 2)     Status: None  ? Collection Time: 12/26/21  9:24 PM  ? Specimen: BLOOD  ?Result Value Ref Range Status  ? Specimen Description BLOOD BLOOD LEFT HAND  Final  ? Special Requests   Final  ?  BOTTLES DRAWN AEROBIC ONLY Blood Culture results may not be optimal due to an inadequate volume of blood received in culture bottles  ?  Culture   Final  ?  NO GROWTH 5 DAYS ?Performed at [facility], [place] [address] [place], [place] [address] ?  ? Report Status 12/31/2021 FINAL  Final  ?  ? ?Radiology Studies: ?No results found. ? ?Sch

## 2022-01-01 NOTE — Anesthesia Postprocedure Evaluation (Signed)
Anesthesia Post Note ? ?Patient: Kayla Harrison ? ?Procedure(s) Performed: CARDIOVERSION ? ?  ? ?Patient location during evaluation: Endoscopy ?Anesthesia Type: General ?Level of consciousness: sedated and patient cooperative ?Pain management: pain level controlled ?Vital Signs Assessment: post-procedure vital signs reviewed and stable ?Respiratory status: spontaneous breathing ?Cardiovascular status: stable ?Anesthetic complications: no ? ? ?No notable events documented. ? ?Last Vitals:  ?Vitals:  ? 01/01/22 0917 01/01/22 1000  ?BP: 129/65 120/75  ?Pulse: 70 88  ?Resp: 20 14  ?Temp:  36.5 ?C  ?SpO2: 95% 95%  ?  ?Last Pain:  ?Vitals:  ? 01/01/22 1000  ?TempSrc: Oral  ?PainSc: 0-No pain  ? ? ?  ?  ?  ?  ?  ?  ? ?[place]on Nations ? ? ? ? ?

## 2022-01-01 NOTE — H&P (View-Only) (Signed)
?Cardiology Progress Note  ?Patient ID: Kayla Harrison ?MRN: [id number] ?DOB: [birth year] ?Date of Encounter: 01/01/2022 ? ?Primary Cardiologist: [name], MD ? ?Subjective  ? ?Chief Complaint: None.  ? ?HPI: Remains in [place]. DCCV today.  ? ?ROS:  ?All other ROS reviewed and negative. Pertinent positives noted in the HPI.    ? ?Inpatient Medications  ?Scheduled Meds: ? (feeding supplement) PROSource Plus  30 mL Oral BID BM  ? amiodarone  200 mg Oral BID  ? apixaban  5 mg Oral BID  ? calcium-vitamin D  1 tablet Oral Q breakfast  ? cephALEXin  500 mg Oral Q8H  ? cycloSPORINE  1 drop Both Eyes Daily  ? digoxin  0.125 mg Oral Daily  ? feeding supplement  237 mL Oral BID BM  ? levothyroxine  125 mcg Oral Q0600  ? lidocaine  2 patch Transdermal QODAY  ? magnesium oxide  400 mg Oral Daily  ? metoprolol succinate  50 mg Oral Daily  ? midodrine  10 mg Oral TID WC  ? multivitamin with minerals  1 tablet Oral Daily  ? Muscle Rub  1 application. Topical Daily  ? rosuvastatin  10 mg Oral Once per day on Mon Thu  ? ?Continuous Infusions: ? sodium chloride    ? magnesium sulfate bolus IVPB 1 g (01/01/22 9518)  ? ?PRN Meds: ?HYDROcodone-acetaminophen  ? ?Vital Signs  ? ?Vitals:  ? 01/01/22 0400 01/01/22 0500 01/01/22 0502 01/01/22 0521  ?BP: 128/90     ?Pulse: 87 89 89 90  ?Resp: 15 13 11 14   ?Temp:      ?TempSrc:      ?SpO2: 93% 95% (P) 97% 97%  ?Weight:      ?Height:      ? ? ?Intake/Output Summary (Last 24 hours) at 01/01/2022 0642 ?Last data filed at 12/31/2021 2024 ?Gross per 24 hour  ?Intake 360 ml  ?Output 700 ml  ?Net -340 ml  ? ? ?  12/29/2021  ?  6:00 AM 12/27/2021  ?  7:49 PM 12/26/2021  ?  8:08 PM  ?Last 3 Weights  ?Weight (lbs) 190 lb 11.2 oz 197 lb 15.6 oz 187 lb 6.3 oz  ?Weight (kg) 86.5 kg 89.8 kg 85 kg  ?   ? ?Telemetry  ?Overnight telemetry shows a flutter heart rate in the 90s, which I personally reviewed.  ? ?Physical Exam  ? ?Vitals:  ? 01/01/22 0400 01/01/22 0500 01/01/22 0502 01/01/22 0521  ?BP: 128/90      ?Pulse: 87 89 89 90  ?Resp: 15 13 11 14   ?Temp:      ?TempSrc:      ?SpO2: 93% 95% (P) 97% 97%  ?Weight:      ?Height:      ?  ?Intake/Output Summary (Last 24 hours) at 01/01/2022 0642 ?Last data filed at 12/31/2021 2024 ?Gross per 24 hour  ?Intake 360 ml  ?Output 700 ml  ?Net -340 ml  ?  ? ?  12/29/2021  ?  6:00 AM 12/27/2021  ?  7:49 PM 12/26/2021  ?  8:08 PM  ?Last 3 Weights  ?Weight (lbs) 190 lb 11.2 oz 197 lb 15.6 oz 187 lb 6.3 oz  ?Weight (kg) 86.5 kg 89.8 kg 85 kg  ?  Body mass index is 30.78 kg/m?.  ?General: Well nourished, well developed, in no acute distress ?Head: Atraumatic, normal size  ?Eyes: PEERLA, EOMI  ?Neck: Supple, no JVD ?Endocrine: No thryomegaly ?Cardiac: Normal S1, S2; RRR; no murmurs, rubs,  or gallops ?Lungs: Clear to auscultation bilaterally, no wheezing, rhonchi or rales  ?Abd: Soft, nontender, no hepatomegaly  ?Ext: Trace edema ?Musculoskeletal: No deformities, BUE and BLE strength normal and equal ?Skin: Warm and dry, no rashes   ?Neuro: Alert and oriented to person, place, time, and situation, CNII-XII grossly intact, no focal deficits  ?Psych: Normal mood and affect  ? ?Labs  ?High Sensitivity Troponin:  No results for input(s): TROPONINIHS in the last 720 hours.   ?Cardiac EnzymesNo results for input(s): TROPONINI in the last 168 hours. No results for input(s): TROPIPOC in the last 168 hours.  ?Chemistry ?Recent Labs  ?Lab 12/26/21 ?1637 12/27/21 ?9509 12/30/21 ?0813 12/31/21 ?3267 01/01/22 ?0121  ?NA 131*   < > 134* 135 134*  ?K 4.4   < > 3.6 3.1* 4.3  ?CL 99   < > 99 94* 97*  ?CO2 25   < > 27 33* 30  ?GLUCOSE 101*   < > 81 94 88  ?BUN 10   < > 13 16 17   ?CREATININE 1.02*   < > 0.81 0.91 0.79  ?CALCIUM 8.3*   < > 7.9* 8.1* 8.2*  ?PROT 5.2*  --   --   --   --   ?ALBUMIN 2.8*  --   --   --   --   ?AST 29  --   --   --   --   ?ALT 25  --   --   --   --   ?ALKPHOS 154*  --   --   --   --   ?BILITOT 1.3*  --   --   --   --   ?GFRNONAA 57*   < > >60 >60 >60  ?ANIONGAP 7   < > 8 8 7   ? < > =  values in this interval not displayed.  ?  ?Hematology ?Recent Labs  ?Lab 12/27/21 ?1245 12/28/21 ?0139 12/31/21 ?0245  ?WBC 12.6* 10.1 7.4  ?RBC 3.55* 3.61* 3.71*  ?HGB 11.4* 11.7* 12.2  ?HCT 37.5 37.6 37.1  ?MCV 105.6* 104.2* 100.0  ?MCH 32.1 32.4 32.9  ?MCHC 30.4 31.1 32.9  ?RDW 19.0* 18.6* 17.9*  ?PLT 178 165 145*  ? ?BNP ?Recent Labs  ?Lab 12/28/21 ?8099  ?BNP 1,019.7*  ?  ?DDimer No results for input(s): DDIMER in the last 168 hours.  ? ?Radiology  ?No results found. ? ?Cardiac Studies  ?TTE 11/13/2021 ? 1. Left ventricular ejection fraction, by estimation, is 55 to 60%. The  ?left ventricle has normal function. The left ventricle has no regional  ?wall motion abnormalities. Indeterminate diastolic filling due to E-A  ?fusion. There is the interventricular  ?septum is flattened in diastole ('D' shaped left ventricle), consistent  ?with right ventricular volume overload.  ? 2. Right ventricular systolic function is mildly reduced. The right  ?ventricular size is mildly enlarged. There is mildly elevated pulmonary  ?artery systolic pressure.  ? 3. Left atrial size was moderately dilated.  ? 4. The mitral valve is degenerative. Moderate mitral valve regurgitation.  ?Mild mitral stenosis. The mean mitral valve gradient is 5.0 mmHg with  ?average heart rate of 122 bpm. Severe mitral annular calcification.  ? 5. Tricuspid valve regurgitation is severe.  ? 6. The aortic valve is normal in structure. There is mild calcification  ?of the aortic valve. There is mild thickening of the aortic valve. Aortic  ?valve regurgitation is not visualized. Aortic valve sclerosis is present,  ?with no evidence  of aortic valve  ?stenosis.  ? 7. The inferior vena cava is dilated in size with >50% respiratory  ?variability, suggesting right atrial pressure of 8 mmHg.  ? ?Patient Profile  ?76 year old female with hypertension, hyperlipidemia, non-Hodgkin's lymphoma, persistent atrial fibrillation status post ablation in 2008/2014,  complete heart block requiring permanent pacemaker, stroke, orthostatic hypotension, lung cancer status post chemoradiation, degenerative mitral valve disease who was admitted on 12/26/2021 with weakness and fatigue and found to have urinary tract infection.  Cardiology consulted on 12/30/2021 for tachycardia and decompensated diastolic heart failure. ? ?Assessment & Plan  ? ?#Atypical atrial flutter ?-Device interrogation confirmed atrial flutter by primary electrophysiologist. ?-On digoxin for rate control given soft blood pressure. ?-On Eliquis with no missed doses in the last 3 weeks.  Plan for cardioversion today at 9 AM. ?-Also on amiodarone.  Continue this for rhythm control after cardioversion. ? ?#Chronic diastolic heart failure ?-Lasix as needed. ? ?#Orthostatic hypotension ?-After discussion with electrophysiologist we will start her on fludrocortisone after her cardioversion.   ? ?For questions or updates, please contact [place] ?Please consult [email or web address] for contact info under  ?   ?Signed, ?[place] B[place] T. Audie Box, MD, [facility] ?[place]  ?01/01/2022 6:42 AM  ? ?

## 2022-01-01 NOTE — Progress Notes (Signed)
Physical Therapy Treatment ?Patient Details ?Name: Kayla Harrison ?MRN: [id number] ?DOB: 11/15/1945 ?Today's Date: 01/01/2022 ? ? ?History of Present Illness Patient is a 76 year old female admitted from rehab with generalized weakness, decreased oral intake, fall, increased urinary frequency, urgency. Dx with sepsis 2* UTI. PMH: hypothyroidism, hyperlipidemia, hypertension, non Hodgkins lymphoma, chronic A-fib on Eliquis, ESBL UTI ? ?  ?PT Comments  ? ? Patient limited by anxiety and fatigue. Becomes anxious during step-pivot transfer with RW "I'm going to fall!" Did transfer to/from chair, but did not stay up in chair due to continue diarrhea and rectal pouch leaking. RN made aware and  in to address at end of session.  ?  ?Recommendations for follow up therapy are one component of a multi-disciplinary discharge planning process, led by the attending physician.  Recommendations may be updated based on patient status, additional functional criteria and insurance authorization. ? ?Follow Up Recommendations ? Skilled nursing-short term rehab (<3 hours/day) ?  ?  ?Assistance Recommended at Discharge Frequent or constant Supervision/Assistance  ?Patient can return home with the following A lot of help with walking and/or transfers;A lot of help with bathing/dressing/bathroom;Help with stairs or ramp for entrance;Assist for transportation ?  ?Equipment Recommendations ? None recommended by PT  ?  ?Recommendations for Other Services   ? ? ?  ?Precautions / Restrictions Precautions ?Precautions: Fall ?Restrictions ?Weight Bearing Restrictions: No  ?  ? ?Mobility ? Bed Mobility ?Overal bed mobility: Needs Assistance ?Bed Mobility: Rolling, Sidelying to Sit, Sit to Sidelying ?Rolling: Mod assist ?Sidelying to sit: Max assist, +2 for physical assistance ?  ?  ?Sit to sidelying: Max assist, +2 for physical assistance ?General bed mobility comments: increased time to initiate; vc and tactile cues for sequencing then physical  assist to complete tasks; rolling at beginning of session for donning brief and again on return to bed to remove brief ?  ? ?Transfers ?Overall transfer level: Needs assistance ?Equipment used: Rolling walker (2 wheels) ?Transfers: Sit to/from Stand, Bed to chair/wheelchair/BSC ?Sit to Stand: From elevated surface, Mod assist, +2 physical assistance ?  ?Step pivot transfers: Mod assist, Max assist, +2 physical assistance ?  ?  ?  ?General transfer comment: from elevated bed x 2 with assist to boost and steady upon standing; from recliner assist for anterior translation over her BOS (pt with tendency to lean backwards, even during mid-transfer with feet then sliding forward out from under her); +2 max to safely complete pivot and sit on EOB ?  ? ?Ambulation/Gait ?  ?  ?  ?  ?  ?  ?Pre-gait activities: poor step quality, length with pivotal steps bed to/from chair; required assist to wt-shift to allow her to advance each foot ?  ? ? ?Stairs ?  ?  ?  ?  ?  ? ? ?Wheelchair Mobility ?  ? ?Modified Rankin (Stroke Patients Only) ?  ? ? ?  ?Balance Overall balance assessment: Needs assistance, History of Falls ?Sitting-balance support: Feet supported, No upper extremity supported ?Sitting balance-Leahy Scale: Good ?  ?  ?Standing balance support: Reliant on assistive device for balance, Bilateral upper extremity supported ?Standing balance-Leahy Scale: Zero ?Standing balance comment: up to +2 max assist with posterior lean during transfer ?  ?  ?  ?  ?  ?  ?  ?  ?  ?  ?  ?  ? ?  ?Cognition Arousal/Alertness: Awake/alert ?Behavior During Therapy: [facility] for tasks assessed/performed ?Overall Cognitive Status: Within Functional Limits for tasks  assessed ?  ?  ?  ?  ?  ?  ?  ?  ?  ?  ?  ?  ?  ?  ?  ?  ?General Comments: slightly delayed processing ?  ?  ? ?  ?Exercises General Exercises - Lower Extremity ?Ankle Circles/Pumps: AROM, Both, Supine, 10 reps ?Heel Slides: AAROM, Both, 5 reps, Supine ?Hip ABduction/ADduction: AAROM,  Both, 5 reps ? ?  ?General Comments   ?  ?  ? ?Pertinent Vitals/Pain Pain Assessment ?Pain Assessment: Faces ?Faces Pain Scale: No hurt  ? ? ?Home Living   ?  ?  ?  ?  ?  ?  ?  ?  ?  ?   ?  ?Prior Function    ?  ?  ?   ? ?PT Goals (current goals can now be found in the care plan section) Acute Rehab PT Goals ?Patient Stated Goal: to go home ?Time For Goal Achievement: 01/11/22 ?Potential to Achieve Goals: Good ?Progress towards PT goals: Progressing toward goals ? ?  ?Frequency ? ? ? Min 3X/week ? ? ? ?  ?PT Plan Current plan remains appropriate  ? ? ?Co-evaluation PT/OT/SLP Co-Evaluation/Treatment: Yes ?Reason for Co-Treatment: For patient/therapist safety;To address functional/ADL transfers ?PT goals addressed during session: Mobility/safety with mobility;Balance;Proper use of DME ?  ?  ? ?  ?AM-PAC PT "6 Clicks" Mobility   ?Outcome Measure ? Help needed turning from your back to your side while in a flat bed without using bedrails?: A Lot ?Help needed moving from lying on your back to sitting on the side of a flat bed without using bedrails?: Total ?Help needed moving to and from a bed to a chair (including a wheelchair)?: Total ?Help needed standing up from a chair using your arms (e.g., wheelchair or bedside chair)?: Total ?Help needed to walk in hospital room?: Total ?Help needed climbing 3-5 steps with a railing? : Total ?6 Click Score: 7 ? ?  ?End of Session Equipment Utilized During Treatment: Gait belt ?Activity Tolerance: Patient limited by fatigue ?Patient left: with call bell/phone within reach;in bed;with nursing/sitter in room;with bed alarm set ?Nurse Communication: Mobility status;Need for lift equipment;Other (comment) (rectal pouch leaking) ?PT Visit Diagnosis: Unsteadiness on feet (R26.81);Muscle weakness (generalized) (M62.81) ?  ? ? ?Time: 1125-1212 ?PT Time Calculation (min) (ACUTE ONLY): 47 min ? ?Charges:  $Therapeutic Exercise: 8-22 mins ?$Therapeutic Activity: 8-22 mins          ?           ? ? ?Arby Barrette, PT ?Acute Rehabilitation Services  ?Pager [phone] ?Office [phone] ? ? ? ?Jeanie Cooks Alleen Kehm ?01/01/2022, 12:50 PM ? ?

## 2022-01-01 NOTE — Progress Notes (Signed)
Occupational Therapy Treatment ?Patient Details ?Name: Kayla Harrison ?MRN: [id number] ?DOB: 1946-08-08 ?Today's Date: 01/01/2022 ? ? ?History of present illness Patient is a 76 year old female admitted from rehab with generalized weakness, decreased oral intake, fall, increased urinary frequency, urgency. Dx with sepsis 2* UTI. PMH: hypothyroidism, hyperlipidemia, hypertension, non Hodgkins lymphoma, chronic A-fib on Eliquis, ESBL UTI ?  ?OT comments ? Patient received in bed and performed LB bathing and gown change at bed level. Patient performed sitting balance on EOB and transfer to recliner with assistance of 2 and patient demonstrating fear of falling stating "I'm going to fall" to chair and when we returned to bed due to diarrhea. Acute OT to continue to follow.   ? ?Recommendations for follow up therapy are one component of a multi-disciplinary discharge planning process, led by the attending physician.  Recommendations may be updated based on patient status, additional functional criteria and insurance authorization. ?   ?Follow Up Recommendations ? Skilled nursing-short term rehab (<3 hours/day)  ?  ?Assistance Recommended at Discharge Frequent or constant Supervision/Assistance  ?Patient can return home with the following ? Two people to help with walking and/or transfers;A lot of help with bathing/dressing/bathroom;Assistance with cooking/housework;Help with stairs or ramp for entrance ?  ?Equipment Recommendations ? None recommended by OT  ?  ?Recommendations for Other Services   ? ?  ?Precautions / Restrictions Precautions ?Precautions: Fall ?Restrictions ?Weight Bearing Restrictions: No  ? ? ?  ? ?Mobility Bed Mobility ?Overal bed mobility: Needs Assistance ?Bed Mobility: Rolling, Sidelying to Sit, Sit to Sidelying ?Rolling: Mod assist ?Sidelying to sit: Max assist, +2 for physical assistance ?  ?  ?Sit to sidelying: Max assist, +2 for physical assistance ?General bed mobility comments: increased  time to initiate; vc and tactile cues for sequencing then physical assist to complete tasks; rolling at beginning of session for donning brief and again on return to bed to remove brief ?  ? ?Transfers ?Overall transfer level: Needs assistance ?Equipment used: Rolling walker (2 wheels) ?Transfers: Sit to/from Stand, Bed to chair/wheelchair/BSC ?Sit to Stand: From elevated surface, Mod assist, +2 physical assistance ?  ?  ?Step pivot transfers: Mod assist, Max assist, +2 physical assistance ?  ?  ?General transfer comment: from elevated bed x 2 with assist to boost and steady upon standing; from recliner assist for anterior translation over her BOS (pt with tendency to lean backwards, even during mid-transfer with feet then sliding forward out from under her); +2 max to safely complete pivot and sit on EOB ?  ?  ?Balance Overall balance assessment: Needs assistance, History of Falls ?Sitting-balance support: Feet supported, No upper extremity supported ?Sitting balance-Leahy Scale: Good ?  ?  ?Standing balance support: Reliant on assistive device for balance, Bilateral upper extremity supported ?Standing balance-Leahy Scale: Zero ?Standing balance comment: up to +2 max assist with posterior lean during transfer ?  ?  ?  ?  ?  ?  ?  ?  ?  ?  ?  ?   ? ?ADL either performed or assessed with clinical judgement  ? ?ADL Overall ADL's : Needs assistance/impaired ?  ?  ?  ?  ?  ?  ?Lower Body Bathing: Maximal assistance;Bed level ?Lower Body Bathing Details (indicate cue type and reason): cleaning following BM ?Upper Body Dressing : Moderate assistance;Bed level ?Upper Body Dressing Details (indicate cue type and reason): changed gown ?Lower Body Dressing: Maximal assistance;Bed level ?Lower Body Dressing Details (indicate cue type and reason): donned  adult diaper in bed ?  ?  ?  ?  ?  ?  ?  ?General ADL Comments: adult diaper donned to address frequent diarrhea ?  ? ?Extremity/Trunk Assessment   ?  ?  ?  ?  ?  ? ?Vision   ?   ?  ?Perception   ?  ?Praxis   ?  ? ?Cognition Arousal/Alertness: Awake/alert ?Behavior During Therapy: [facility] for tasks assessed/performed ?Overall Cognitive Status: Within Functional Limits for tasks assessed ?  ?  ?  ?  ?  ?  ?  ?  ?  ?  ?  ?  ?  ?  ?  ?  ?General Comments: slightly delayed processing ?  ?  ?   ?Exercises   ? ?  ?Shoulder Instructions   ? ? ?  ?General Comments    ? ? ?Pertinent Vitals/ Pain       Pain Assessment ?Pain Assessment: Faces ?Faces Pain Scale: No hurt ?Pain Intervention(s): Monitored during session ? ?Home Living   ?  ?  ?  ?  ?  ?  ?  ?  ?  ?  ?  ?  ?  ?  ?  ?  ?  ?  ? ?  ?Prior Functioning/Environment    ?  ?  ?  ?   ? ?Frequency ? Min 2X/week  ? ? ? ? ?  ?Progress Toward Goals ? ?OT Goals(current goals can now be found in the care plan section) ? Progress towards OT goals: Progressing toward goals ? ?Acute Rehab OT Goals ?Patient Stated Goal: get better ?OT Goal Formulation: With patient ?Time For Goal Achievement: 01/11/22 ?Potential to Achieve Goals: Good ?ADL Goals ?Pt Will Perform Lower Body Dressing: with min assist;with adaptive equipment;sit to/from stand;sitting/lateral leans ?Pt Will Transfer to Toilet: with min assist;stand pivot transfer;bedside commode ?Pt Will Perform Toileting - Clothing Manipulation and hygiene: with min assist;sit to/from stand;sitting/lateral leans  ?Plan Discharge plan remains appropriate   ? ?Co-evaluation ? ? ? PT/OT/SLP Co-Evaluation/Treatment: Yes ?Reason for Co-Treatment: For patient/therapist safety;To address functional/ADL transfers ?PT goals addressed during session: Mobility/safety with mobility;Balance;Proper use of DME ?OT goals addressed during session: ADL's and self-care ?  ? ?  ?AM-PAC OT "6 Clicks" Daily Activity     ?Outcome Measure ? ? Help from another person eating meals?: None ?Help from another person taking care of personal grooming?: A Little ?Help from another person toileting, which includes using toliet, bedpan, or  urinal?: Total ?Help from another person bathing (including washing, rinsing, drying)?: A Lot ?Help from another person to put on and taking off regular upper body clothing?: A Little ?Help from another person to put on and taking off regular lower body clothing?: Total ?6 Click Score: 14 ? ?  ?End of Session Equipment Utilized During Treatment: Gait belt;Rolling walker (2 wheels) ? ?OT Visit Diagnosis: Unsteadiness on feet (R26.81);Other abnormalities of gait and mobility (R26.89);History of falling (Z91.81) ?  ?Activity Tolerance Patient limited by fatigue ?  ?Patient Left in bed;with call bell/phone within reach;with bed alarm set ?  ?Nurse Communication Mobility status ?  ? ?   ? ?Time: 1125-1204 ?OT Time Calculation (min): 39 min ? ?Charges: OT General Charges ?$OT Visit: 1 Visit ?OT Treatments ?$Self Care/Home Management : 8-22 mins ? ?Lodema Hong, OTA ?Acute Rehabilitation Services  ?Pager [phone] ?Office [phone] ? ? ?[place] ?01/01/2022, 1:31 PM ?

## 2022-01-01 NOTE — Anesthesia Preprocedure Evaluation (Signed)
Anesthesia Evaluation  ?Patient identified by MRN, date of birth, ID band ?Patient awake ? ? ? ?Reviewed: ?Allergy & Precautions, NPO status , Patient's Chart, lab work & pertinent test results, reviewed documented beta blocker date and time  ? ?History of Anesthesia Complications ?(+) history of anesthetic complications ? ?Airway ?Mallampati: III ? ?TM Distance: >3 FB ?Neck ROM: Full ? ? ? Dental ?no notable dental hx. ?(+) Teeth Intact, Caps, Dental Advisory Given ?  ?Pulmonary ?shortness of breath and with exertion, sleep apnea and Continuous Positive Airway Pressure Ventilation ,  ?Lung Ca LL ?  ?Pulmonary exam normal ?breath sounds clear to auscultation ? ? ? ? ? ? Cardiovascular ?hypertension, Pt. on medications ?pulmonary hypertension+ dysrhythmias Atrial Fibrillation + pacemaker + Valvular Problems/Murmurs MR  ?Rhythm:Irregular Rate:Abnormal ? ?Echo 10/2021 ??1. Left ventricular ejection fraction, by estimation, is 55 to 60%. The left ventricle has normal function. The left ventricle has no regional wall motion abnormalities. Indeterminate diastolic filling due to E-A fusion. There is the interventricular septum is flattened in diastole ('D' shaped left ventricle), consistent with right ventricular volume overload.  ??2. Right ventricular systolic function is mildly reduced. The right ventricular size is mildly enlarged. There is mildly elevated pulmonary artery systolic pressure.  ??3. Left atrial size was moderately dilated.  ??4. The mitral valve is degenerative. Moderate mitral valve regurgitation. Mild mitral stenosis. The mean mitral valve gradient is 5.0 mmHg with average heart rate of 122 bpm. Severe mitral annular calcification.  ??5. Tricuspid valve regurgitation is severe.  ??6. The aortic valve is normal in structure. There is mild calcification of the aortic valve. There is mild thickening of the aortic valve. Aortic valve regurgitation is not visualized. Aortic  valve sclerosis is present, with no evidence of aortic valve stenosis.  ??7. The inferior vena cava is dilated in size with >50% respiratory variability, suggesting right atrial pressure of 8 mmHg.  ? ? ?EKG 02/14/21 ?Atrial fibrillation ? ?Echo 12/10/19 ?1. TEE without evidence of thrombus. Proceeded to successful cardioversion.  ??2. Left ventricular ejection fraction, by estimation, is 55 to 60%. The left ventricle has normal function. The left ventricle has no regional wall motion abnormalities. Left ventricular diastolic function could not be evaluated.  ??3. Right ventricular systolic function is normal. The right ventricular size is normal. There is moderately elevated pulmonary artery systolic pressure.  ??4. No left atrial/left atrial appendage thrombus was detected.  ??5. The mitral valve is degenerative. Mild mitral valve regurgitation. No evidence of mitral stenosis.  ??6. Tricuspid valve regurgitation is mild to moderate.  ??7. The aortic valve is tricuspid. Aortic valve regurgitation is trivial. No aortic stenosis is present.  ??8. There is mild (Grade II) plaque involving the descending aorta ? ? ?Cardiac Cath 07/21/19 ?1. Elevated PCWP with prominent V waves. ?2. Moderate pulmonary venous hypertension. ?3. Normal right-sided filling pressure. ?? ?This looks like either significant LV diastolic dysfunction or mitral valve disease. I looked at her TTE, neither mitral regurgitation nor mitral stenosis looks markedly significant.  However, I think that a TEE for closer look at mitral valve would be in order. ?? ?On Amiodarone- last dose this am ? ?S/P multiple cardioversions and ablations ?  ?Neuro/Psych ? Neuromuscular disease CVA, No Residual Symptoms negative psych ROS  ? GI/Hepatic ?hiatal hernia, (+) Hepatitis -Hx/o brucellosis as a teen ?Dysphagia ?S/P lap gastric band ?  ?Endo/Other  ?Hypothyroidism Obesity ? Renal/GU ?Renal diseaseHx/o renal calculi  ?negative genitourinary ?   ?Musculoskeletal ? ?(+) Arthritis , Osteoarthritis,   ?  Abdominal ?  ?Peds ? Hematology ? ?(+) Blood dyscrasia, anemia , Hx/o NHL S/P RT ?Eliquis therapy-last dose this am ?   ?Anesthesia Other Findings ? ? Reproductive/Obstetrics ? ?  ? ? ? ? ? ? ? ? ? ? ? ? ? ?  ?  ? ? ? ? ? ? ? ?Anesthesia Physical ? ?Anesthesia Plan ? ?ASA: 3 ? ?Anesthesia Plan: General  ? ?Post-op Pain Management: Minimal or no pain anticipated  ? ?Induction: Intravenous ? ?PONV Risk Score and Plan: 3 and Propofol infusion and Treatment may vary due to age or medical condition ? ?Airway Management Planned: Mask and Natural Airway ? ?Additional Equipment:  ? ?Intra-op Plan:  ? ?Post-operative Plan:  ? ?Informed Consent: I have reviewed the patients History and Physical, chart, labs and discussed the procedure including the risks, benefits and alternatives for the proposed anesthesia with the patient or authorized representative who has indicated his/her understanding and acceptance.  ? ? ? ?Dental advisory given ? ?Plan Discussed with: CRNA ? ?Anesthesia Plan Comments:   ? ? ? ? ? ? ?Anesthesia Quick Evaluation ? ?

## 2022-01-01 NOTE — Anesthesia Procedure Notes (Signed)
Procedure Name: General with mask airway ?Date/Time: 01/01/2022 8:39 AM ?Performed by: Janace Litten, CRNA ?Pre-anesthesia Checklist: Patient identified, Emergency Drugs available, Suction available and Patient being monitored ?Patient Re-evaluated:Patient Re-evaluated prior to induction ?Oxygen Delivery Method: Ambu bag ?Preoxygenation: Pre-oxygenation with 100% oxygen ?Induction Type: IV induction ? ? ? ? ?

## 2022-01-01 NOTE — CV Procedure (Signed)
Procedure: Electrical Cardioversion ?Indications:  Atrial Flutter ? ?Procedure Details: ? ?Consent: Risks of procedure as well as the alternatives and risks of each were explained to the (patient/caregiver).  Consent for procedure obtained. ? ?Time Out: Verified patient identification, verified procedure, site/side was marked, verified correct patient position, special equipment/implants available, medications/allergies/relevent history reviewed, required imaging and test results available.  Performed ? ?Patient placed on cardiac monitor, pulse oximetry, supplemental oxygen as necessary.  ?Sedation given:  propofol 50 mg IV, Dr. Lissa Hoard ?Pacer pads placed anterior and posterior chest. ? ?Cardioverted 1 time(s).  ?Cardioversion with synchronized biphasic 120J shock. ? ?Evaluation: ?Findings: Post procedure EKG shows:  Atrial paced-ventricular sensed ?Complications: None ?Patient did tolerate procedure well. ? ?Time Spent Directly with the Patient: ? ?30 minutes  ? ?Kayla Harrison ?01/01/2022, 8:56 AM ? ? ? ? ?

## 2022-01-01 NOTE — Discharge Instructions (Signed)

## 2022-01-01 NOTE — Progress Notes (Signed)
?Cardiology Progress Note  ?Patient ID: Kayla Harrison ?MRN: [id number] ?DOB: 1946/04/27 ?Date of Encounter: 01/01/2022 ? ?Primary Cardiologist: [name], MD ? ?Subjective  ? ?Chief Complaint: None.  ? ?HPI: Remains in [place]. DCCV today.  ? ?ROS:  ?All other ROS reviewed and negative. Pertinent positives noted in the HPI.    ? ?Inpatient Medications  ?Scheduled Meds: ? (feeding supplement) PROSource Plus  30 mL Oral BID BM  ? amiodarone  200 mg Oral BID  ? apixaban  5 mg Oral BID  ? calcium-vitamin D  1 tablet Oral Q breakfast  ? cephALEXin  500 mg Oral Q8H  ? cycloSPORINE  1 drop Both Eyes Daily  ? digoxin  0.125 mg Oral Daily  ? feeding supplement  237 mL Oral BID BM  ? levothyroxine  125 mcg Oral Q0600  ? lidocaine  2 patch Transdermal QODAY  ? magnesium oxide  400 mg Oral Daily  ? metoprolol succinate  50 mg Oral Daily  ? midodrine  10 mg Oral TID WC  ? multivitamin with minerals  1 tablet Oral Daily  ? Muscle Rub  1 application. Topical Daily  ? rosuvastatin  10 mg Oral Once per day on Mon Thu  ? ?Continuous Infusions: ? sodium chloride    ? magnesium sulfate bolus IVPB 1 g (01/01/22 9528)  ? ?PRN Meds: ?HYDROcodone-acetaminophen  ? ?Vital Signs  ? ?Vitals:  ? 01/01/22 0400 01/01/22 0500 01/01/22 0502 01/01/22 0521  ?BP: 128/90     ?Pulse: 87 89 89 90  ?Resp: 15 13 11 14   ?Temp:      ?TempSrc:      ?SpO2: 93% 95% (P) 97% 97%  ?Weight:      ?Height:      ? ? ?Intake/Output Summary (Last 24 hours) at 01/01/2022 0642 ?Last data filed at 12/31/2021 2024 ?Gross per 24 hour  ?Intake 360 ml  ?Output 700 ml  ?Net -340 ml  ? ? ?  12/29/2021  ?  6:00 AM 12/27/2021  ?  7:49 PM 12/26/2021  ?  8:08 PM  ?Last 3 Weights  ?Weight (lbs) 190 lb 11.2 oz 197 lb 15.6 oz 187 lb 6.3 oz  ?Weight (kg) 86.5 kg 89.8 kg 85 kg  ?   ? ?Telemetry  ?Overnight telemetry shows a flutter heart rate in the 90s, which I personally reviewed.  ? ?Physical Exam  ? ?Vitals:  ? 01/01/22 0400 01/01/22 0500 01/01/22 0502 01/01/22 0521  ?BP: 128/90      ?Pulse: 87 89 89 90  ?Resp: 15 13 11 14   ?Temp:      ?TempSrc:      ?SpO2: 93% 95% (P) 97% 97%  ?Weight:      ?Height:      ?  ?Intake/Output Summary (Last 24 hours) at 01/01/2022 0642 ?Last data filed at 12/31/2021 2024 ?Gross per 24 hour  ?Intake 360 ml  ?Output 700 ml  ?Net -340 ml  ?  ? ?  12/29/2021  ?  6:00 AM 12/27/2021  ?  7:49 PM 12/26/2021  ?  8:08 PM  ?Last 3 Weights  ?Weight (lbs) 190 lb 11.2 oz 197 lb 15.6 oz 187 lb 6.3 oz  ?Weight (kg) 86.5 kg 89.8 kg 85 kg  ?  Body mass index is 30.78 kg/m?.  ?General: Well nourished, well developed, in no acute distress ?Head: Atraumatic, normal size  ?Eyes: PEERLA, EOMI  ?Neck: Supple, no JVD ?Endocrine: No thryomegaly ?Cardiac: Normal S1, S2; RRR; no murmurs, rubs,  or gallops ?Lungs: Clear to auscultation bilaterally, no wheezing, rhonchi or rales  ?Abd: Soft, nontender, no hepatomegaly  ?Ext: Trace edema ?Musculoskeletal: No deformities, BUE and BLE strength normal and equal ?Skin: Warm and dry, no rashes   ?Neuro: Alert and oriented to person, place, time, and situation, CNII-XII grossly intact, no focal deficits  ?Psych: Normal mood and affect  ? ?Labs  ?High Sensitivity Troponin:  No results for input(s): TROPONINIHS in the last 720 hours.   ?Cardiac EnzymesNo results for input(s): TROPONINI in the last 168 hours. No results for input(s): TROPIPOC in the last 168 hours.  ?Chemistry ?Recent Labs  ?Lab 12/26/21 ?1637 12/27/21 ?7026 12/30/21 ?0813 12/31/21 ?3785 01/01/22 ?0121  ?NA 131*   < > 134* 135 134*  ?K 4.4   < > 3.6 3.1* 4.3  ?CL 99   < > 99 94* 97*  ?CO2 25   < > 27 33* 30  ?GLUCOSE 101*   < > 81 94 88  ?BUN 10   < > 13 16 17   ?CREATININE 1.02*   < > 0.81 0.91 0.79  ?CALCIUM 8.3*   < > 7.9* 8.1* 8.2*  ?PROT 5.2*  --   --   --   --   ?ALBUMIN 2.8*  --   --   --   --   ?AST 29  --   --   --   --   ?ALT 25  --   --   --   --   ?ALKPHOS 154*  --   --   --   --   ?BILITOT 1.3*  --   --   --   --   ?GFRNONAA 57*   < > >60 >60 >60  ?ANIONGAP 7   < > 8 8 7   ? < > =  values in this interval not displayed.  ?  ?Hematology ?Recent Labs  ?Lab 12/27/21 ?8850 12/28/21 ?0139 12/31/21 ?0245  ?WBC 12.6* 10.1 7.4  ?RBC 3.55* 3.61* 3.71*  ?HGB 11.4* 11.7* 12.2  ?HCT 37.5 37.6 37.1  ?MCV 105.6* 104.2* 100.0  ?MCH 32.1 32.4 32.9  ?MCHC 30.4 31.1 32.9  ?RDW 19.0* 18.6* 17.9*  ?PLT 178 165 145*  ? ?BNP ?Recent Labs  ?Lab 12/28/21 ?2774  ?BNP 1,019.7*  ?  ?DDimer No results for input(s): DDIMER in the last 168 hours.  ? ?Radiology  ?No results found. ? ?Cardiac Studies  ?TTE 11/13/2021 ? 1. Left ventricular ejection fraction, by estimation, is 55 to 60%. The  ?left ventricle has normal function. The left ventricle has no regional  ?wall motion abnormalities. Indeterminate diastolic filling due to E-A  ?fusion. There is the interventricular  ?septum is flattened in diastole ('D' shaped left ventricle), consistent  ?with right ventricular volume overload.  ? 2. Right ventricular systolic function is mildly reduced. The right  ?ventricular size is mildly enlarged. There is mildly elevated pulmonary  ?artery systolic pressure.  ? 3. Left atrial size was moderately dilated.  ? 4. The mitral valve is degenerative. Moderate mitral valve regurgitation.  ?Mild mitral stenosis. The mean mitral valve gradient is 5.0 mmHg with  ?average heart rate of 122 bpm. Severe mitral annular calcification.  ? 5. Tricuspid valve regurgitation is severe.  ? 6. The aortic valve is normal in structure. There is mild calcification  ?of the aortic valve. There is mild thickening of the aortic valve. Aortic  ?valve regurgitation is not visualized. Aortic valve sclerosis is present,  ?with no evidence  of aortic valve  ?stenosis.  ? 7. The inferior vena cava is dilated in size with >50% respiratory  ?variability, suggesting right atrial pressure of 8 mmHg.  ? ?Patient Profile  ?76 year old female with hypertension, hyperlipidemia, non-Hodgkin's lymphoma, persistent atrial fibrillation status post ablation in 2008/2014,  complete heart block requiring permanent pacemaker, stroke, orthostatic hypotension, lung cancer status post chemoradiation, degenerative mitral valve disease who was admitted on 12/26/2021 with weakness and fatigue and found to have urinary tract infection.  Cardiology consulted on 12/30/2021 for tachycardia and decompensated diastolic heart failure. ? ?Assessment & Plan  ? ?#Atypical atrial flutter ?-Device interrogation confirmed atrial flutter by primary electrophysiologist. ?-On digoxin for rate control given soft blood pressure. ?-On Eliquis with no missed doses in the last 3 weeks.  Plan for cardioversion today at 9 AM. ?-Also on amiodarone.  Continue this for rhythm control after cardioversion. ? ?#Chronic diastolic heart failure ?-Lasix as needed. ? ?#Orthostatic hypotension ?-After discussion with electrophysiologist we will start her on fludrocortisone after her cardioversion.   ? ?For questions or updates, please contact [place] ?Please consult [email or web address] for contact info under  ?   ?Signed, ?[place] T. Audie Box, MD, [facility] ?[place]  ?01/01/2022 6:42 AM  ? ?

## 2022-01-01 NOTE — Interval H&P Note (Signed)
History and Physical Interval Note: ? ?01/01/2022 ?7:40 AM ? ?[name]  has presented today for surgery, with the diagnosis of afib.  The various methods of treatment have been discussed with the patient and family. After consideration of risks, benefits and other options for treatment, the patient has consented to  Procedure(s): ?CARDIOVERSION (N/A) as a surgical intervention.  The patient's history has been reviewed, patient examined, no change in status, stable for surgery.  I have reviewed the patient's chart and labs.  Questions were answered to the patient's satisfaction.   ? ? ?Kayla Harrison ? ? ?

## 2022-01-01 NOTE — TOC Progression Note (Signed)
Transition of Care (TOC) - Progression Note  ? ? ?Patient Details  ?Name: Kayla Harrison ?MRN: [id number] ?Date of Birth: 1946-09-18 ? ?Transition of Care (TOC) CM/SW Contact  ?Benard Halsted, LCSW ?Phone Number: ?01/01/2022, 4:52 PM ? ?Clinical Narrative:    ?CSW made [facility] aware that patient will likely be ready for discharge tomorrow.  ? ? ?Expected Discharge Plan: [place] ?Barriers to Discharge: Continued Medical Work up ? ?Expected Discharge Plan and Services ?Expected Discharge Plan: [place] ?In-house Referral: Clinical Social Work ?  ?Post Acute Care Choice: [place] ?Living arrangements for the past 2 months: [place] ?                ?  ?  ?  ?  ?  ?  ?  ?  ?  ?  ? ? ?Social Determinants of Health (SDOH) Interventions ?  ? ?Readmission Risk Interventions ? ?  12/28/2021  ?  9:28 AM  ?Readmission Risk Prevention Plan  ?Transportation Screening Complete  ?Medication Review Press photographer) Complete  ?PCP or Specialist appointment within 3-5 days of discharge Complete  ?[place] or Home Care Consult Complete  ?SW Recovery Care/Counseling Consult Complete  ?Palliative Care Screening Not Applicable  ?Skilled Nursing Facility Complete  ? ? ?

## 2022-01-01 NOTE — Transfer of Care (Signed)
Immediate Anesthesia Transfer of Care Note ? ?Patient: Kayla Harrison ? ?Procedure(s) Performed: CARDIOVERSION ? ?Patient Location: Endoscopy Unit ? ?Anesthesia Type:General ? ?Level of Consciousness: awake and drowsy ? ?Airway & Oxygen Therapy: Patient Spontanous Breathing ? ?Post-op Assessment: Report given to RN and Post -op Vital signs reviewed and stable ? ?Post vital signs: Reviewed and stable ? ?Last Vitals:  ?Vitals Value Taken Time  ?BP 120/63   ?Temp    ?Pulse 70   ?Resp 12   ?SpO2 94   ? ? ?Last Pain:  ?Vitals:  ? 01/01/22 0819  ?TempSrc: Temporal  ?PainSc: 0-No pain  ?   ? ?  ? ?Complications: No notable events documented. ?

## 2022-01-02 ENCOUNTER — Encounter (HOSPITAL_COMMUNITY): Payer: Self-pay | Admitting: Cardiovascular Disease

## 2022-01-02 DIAGNOSIS — N390 Urinary tract infection, site not specified: Secondary | ICD-10-CM | POA: Diagnosis not present

## 2022-01-02 DIAGNOSIS — M6259 Muscle wasting and atrophy, not elsewhere classified, multiple sites: Secondary | ICD-10-CM | POA: Diagnosis not present

## 2022-01-02 DIAGNOSIS — B9620 Unspecified Escherichia coli [E. coli] as the cause of diseases classified elsewhere: Secondary | ICD-10-CM | POA: Diagnosis not present

## 2022-01-02 DIAGNOSIS — I4891 Unspecified atrial fibrillation: Secondary | ICD-10-CM | POA: Diagnosis not present

## 2022-01-02 DIAGNOSIS — R748 Abnormal levels of other serum enzymes: Secondary | ICD-10-CM | POA: Diagnosis not present

## 2022-01-02 DIAGNOSIS — Z7401 Bed confinement status: Secondary | ICD-10-CM | POA: Diagnosis not present

## 2022-01-02 DIAGNOSIS — I495 Sick sinus syndrome: Secondary | ICD-10-CM | POA: Diagnosis not present

## 2022-01-02 DIAGNOSIS — E039 Hypothyroidism, unspecified: Secondary | ICD-10-CM | POA: Diagnosis not present

## 2022-01-02 DIAGNOSIS — I482 Chronic atrial fibrillation, unspecified: Secondary | ICD-10-CM | POA: Diagnosis not present

## 2022-01-02 DIAGNOSIS — I4819 Other persistent atrial fibrillation: Secondary | ICD-10-CM | POA: Diagnosis not present

## 2022-01-02 DIAGNOSIS — R5381 Other malaise: Secondary | ICD-10-CM | POA: Diagnosis not present

## 2022-01-02 DIAGNOSIS — R41841 Cognitive communication deficit: Secondary | ICD-10-CM | POA: Diagnosis not present

## 2022-01-02 DIAGNOSIS — Z8619 Personal history of other infectious and parasitic diseases: Secondary | ICD-10-CM | POA: Diagnosis not present

## 2022-01-02 DIAGNOSIS — I5032 Chronic diastolic (congestive) heart failure: Secondary | ICD-10-CM | POA: Diagnosis not present

## 2022-01-02 DIAGNOSIS — J189 Pneumonia, unspecified organism: Secondary | ICD-10-CM | POA: Diagnosis not present

## 2022-01-02 DIAGNOSIS — I5033 Acute on chronic diastolic (congestive) heart failure: Secondary | ICD-10-CM | POA: Diagnosis not present

## 2022-01-02 DIAGNOSIS — D500 Iron deficiency anemia secondary to blood loss (chronic): Secondary | ICD-10-CM | POA: Diagnosis not present

## 2022-01-02 DIAGNOSIS — R4189 Other symptoms and signs involving cognitive functions and awareness: Secondary | ICD-10-CM | POA: Diagnosis not present

## 2022-01-02 DIAGNOSIS — R5383 Other fatigue: Secondary | ICD-10-CM | POA: Diagnosis not present

## 2022-01-02 DIAGNOSIS — N179 Acute kidney failure, unspecified: Secondary | ICD-10-CM | POA: Diagnosis not present

## 2022-01-02 DIAGNOSIS — R0600 Dyspnea, unspecified: Secondary | ICD-10-CM | POA: Diagnosis not present

## 2022-01-02 DIAGNOSIS — I119 Hypertensive heart disease without heart failure: Secondary | ICD-10-CM | POA: Diagnosis not present

## 2022-01-02 DIAGNOSIS — I509 Heart failure, unspecified: Secondary | ICD-10-CM | POA: Diagnosis not present

## 2022-01-02 DIAGNOSIS — F39 Unspecified mood [affective] disorder: Secondary | ICD-10-CM | POA: Diagnosis not present

## 2022-01-02 DIAGNOSIS — D6869 Other thrombophilia: Secondary | ICD-10-CM | POA: Diagnosis not present

## 2022-01-02 DIAGNOSIS — R932 Abnormal findings on diagnostic imaging of liver and biliary tract: Secondary | ICD-10-CM | POA: Diagnosis not present

## 2022-01-02 DIAGNOSIS — M25551 Pain in right hip: Secondary | ICD-10-CM | POA: Diagnosis not present

## 2022-01-02 DIAGNOSIS — R1312 Dysphagia, oropharyngeal phase: Secondary | ICD-10-CM | POA: Diagnosis not present

## 2022-01-02 DIAGNOSIS — R2681 Unsteadiness on feet: Secondary | ICD-10-CM | POA: Diagnosis not present

## 2022-01-02 DIAGNOSIS — R262 Difficulty in walking, not elsewhere classified: Secondary | ICD-10-CM | POA: Diagnosis not present

## 2022-01-02 DIAGNOSIS — I484 Atypical atrial flutter: Secondary | ICD-10-CM | POA: Diagnosis not present

## 2022-01-02 DIAGNOSIS — K760 Fatty (change of) liver, not elsewhere classified: Secondary | ICD-10-CM | POA: Diagnosis not present

## 2022-01-02 DIAGNOSIS — Z515 Encounter for palliative care: Secondary | ICD-10-CM | POA: Diagnosis not present

## 2022-01-02 DIAGNOSIS — R197 Diarrhea, unspecified: Secondary | ICD-10-CM | POA: Diagnosis not present

## 2022-01-02 DIAGNOSIS — R051 Acute cough: Secondary | ICD-10-CM | POA: Diagnosis not present

## 2022-01-02 DIAGNOSIS — R2689 Other abnormalities of gait and mobility: Secondary | ICD-10-CM | POA: Diagnosis not present

## 2022-01-02 DIAGNOSIS — Z23 Encounter for immunization: Secondary | ICD-10-CM | POA: Diagnosis not present

## 2022-01-02 DIAGNOSIS — A0472 Enterocolitis due to Clostridium difficile, not specified as recurrent: Secondary | ICD-10-CM | POA: Diagnosis not present

## 2022-01-02 DIAGNOSIS — A0471 Enterocolitis due to Clostridium difficile, recurrent: Secondary | ICD-10-CM | POA: Diagnosis not present

## 2022-01-02 DIAGNOSIS — Z9181 History of falling: Secondary | ICD-10-CM | POA: Diagnosis not present

## 2022-01-02 DIAGNOSIS — I959 Hypotension, unspecified: Secondary | ICD-10-CM | POA: Diagnosis not present

## 2022-01-02 DIAGNOSIS — R238 Other skin changes: Secondary | ICD-10-CM | POA: Diagnosis not present

## 2022-01-02 DIAGNOSIS — J9601 Acute respiratory failure with hypoxia: Secondary | ICD-10-CM | POA: Diagnosis not present

## 2022-01-02 DIAGNOSIS — R531 Weakness: Secondary | ICD-10-CM | POA: Diagnosis not present

## 2022-01-02 DIAGNOSIS — R0902 Hypoxemia: Secondary | ICD-10-CM | POA: Diagnosis not present

## 2022-01-02 DIAGNOSIS — R1314 Dysphagia, pharyngoesophageal phase: Secondary | ICD-10-CM | POA: Diagnosis not present

## 2022-01-02 DIAGNOSIS — E785 Hyperlipidemia, unspecified: Secondary | ICD-10-CM | POA: Diagnosis not present

## 2022-01-02 DIAGNOSIS — R509 Fever, unspecified: Secondary | ICD-10-CM | POA: Diagnosis not present

## 2022-01-02 DIAGNOSIS — R1319 Other dysphagia: Secondary | ICD-10-CM | POA: Diagnosis not present

## 2022-01-02 DIAGNOSIS — I951 Orthostatic hypotension: Secondary | ICD-10-CM | POA: Diagnosis not present

## 2022-01-02 DIAGNOSIS — Z20822 Contact with and (suspected) exposure to covid-19: Secondary | ICD-10-CM | POA: Diagnosis not present

## 2022-01-02 DIAGNOSIS — R9389 Abnormal findings on diagnostic imaging of other specified body structures: Secondary | ICD-10-CM | POA: Diagnosis not present

## 2022-01-02 DIAGNOSIS — I4811 Longstanding persistent atrial fibrillation: Secondary | ICD-10-CM | POA: Diagnosis not present

## 2022-01-02 DIAGNOSIS — Z711 Person with feared health complaint in whom no diagnosis is made: Secondary | ICD-10-CM | POA: Diagnosis not present

## 2022-01-02 DIAGNOSIS — R195 Other fecal abnormalities: Secondary | ICD-10-CM | POA: Diagnosis not present

## 2022-01-02 DIAGNOSIS — C8590 Non-Hodgkin lymphoma, unspecified, unspecified site: Secondary | ICD-10-CM | POA: Diagnosis not present

## 2022-01-02 DIAGNOSIS — Z950 Presence of cardiac pacemaker: Secondary | ICD-10-CM | POA: Diagnosis not present

## 2022-01-02 DIAGNOSIS — F03A0 Unspecified dementia, mild, without behavioral disturbance, psychotic disturbance, mood disturbance, and anxiety: Secondary | ICD-10-CM | POA: Diagnosis not present

## 2022-01-02 DIAGNOSIS — R634 Abnormal weight loss: Secondary | ICD-10-CM | POA: Diagnosis not present

## 2022-01-02 DIAGNOSIS — K7689 Other specified diseases of liver: Secondary | ICD-10-CM | POA: Diagnosis not present

## 2022-01-02 DIAGNOSIS — M6281 Muscle weakness (generalized): Secondary | ICD-10-CM | POA: Diagnosis not present

## 2022-01-02 DIAGNOSIS — J90 Pleural effusion, not elsewhere classified: Secondary | ICD-10-CM | POA: Diagnosis not present

## 2022-01-02 DIAGNOSIS — N3000 Acute cystitis without hematuria: Secondary | ICD-10-CM | POA: Diagnosis not present

## 2022-01-02 DIAGNOSIS — Z8673 Personal history of transient ischemic attack (TIA), and cerebral infarction without residual deficits: Secondary | ICD-10-CM | POA: Diagnosis not present

## 2022-01-02 DIAGNOSIS — R059 Cough, unspecified: Secondary | ICD-10-CM | POA: Diagnosis not present

## 2022-01-02 MED ORDER — HYDROCODONE-ACETAMINOPHEN 5-325 MG PO TABS
1.0000 | ORAL_TABLET | Freq: Four times a day (QID) | ORAL | 0 refills | Status: DC | PRN
Start: 2022-01-02 — End: 2024-03-31

## 2022-01-02 MED ORDER — MIDODRINE HCL 5 MG PO TABS: 5.0000 mg | ORAL_TABLET | Freq: Three times a day (TID) | ORAL | Status: DC

## 2022-01-02 MED ORDER — AMIODARONE HCL 200 MG PO TABS: 200.0000 mg | ORAL_TABLET | Freq: Two times a day (BID) | ORAL | Status: DC

## 2022-01-02 NOTE — Progress Notes (Signed)
Physical Therapy Treatment ?Patient Details ?Name: Kayla Harrison ?MRN: [id number] ?DOB: 1946-03-04 ?Today's Date: 01/02/2022 ? ? ?History of Present Illness Patient is a 76 year old female admitted from rehab with generalized weakness, decreased oral intake, fall, increased urinary frequency, urgency. Dx with sepsis 2* UTI. PMH: hypothyroidism, hyperlipidemia, hypertension, non Hodgkins lymphoma, chronic A-fib on Eliquis, ESBL UTI ? ?  ?PT Comments  ? ? Pt was assessed today for mobility to stand as well as orthostatic readings.  Supine was  142/77, sitting 138.82, standing briefly was 118/84.  Her tolerance to stand is limited by weakness, but is scheduled for trip to SNF which is recommended due to length of time estimated to recover her ability to standing and gait independence.  Follow along with her for focused work on LE strength, sequencing her transition to side of bed and standing and to recover steps with gait.  Pt is max assist to scoot up side of bed, and max to stand partially.  Follow goals of acute PT as ordered.  ?Recommendations for follow up therapy are one component of a multi-disciplinary discharge planning process, led by the attending physician.  Recommendations may be updated based on patient status, additional functional criteria and insurance authorization. ? ?Follow Up Recommendations ? Skilled nursing-short term rehab (<3 hours/day) ?  ?  ?Assistance Recommended at Discharge Frequent or constant Supervision/Assistance  ?Patient can return home with the following A lot of help with walking and/or transfers;A lot of help with bathing/dressing/bathroom;Help with stairs or ramp for entrance;Assist for transportation ?  ?Equipment Recommendations ? None recommended by PT  ?  ?Recommendations for Other Services   ? ? ?  ?Precautions / Restrictions Precautions ?Precautions: Fall ?Precaution Comments: has fragile skin ?Restrictions ?Weight Bearing Restrictions: No  ?  ? ?Mobility ? Bed  Mobility ?Overal bed mobility: Needs Assistance ?Bed Mobility: Rolling, Sidelying to Sit, Sit to Sidelying ?Rolling: Mod assist ?Sidelying to sit: Mod assist ?  ?  ?Sit to sidelying: Mod assist ?General bed mobility comments: tactile and verbal cues to sequence her movement ?  ? ?Transfers ?Overall transfer level: Needs assistance ?Equipment used: Rolling walker (2 wheels) ?Transfers: Sit to/from Stand ?Sit to Stand: Max assist, From elevated surface ?  ?  ?  ?  ?  ?General transfer comment: max assist with RW and then directly with pt demonstrating a low effort to stand ?  ? ?Ambulation/Gait ?  ?  ?  ?  ?  ?  ?  ?General Gait Details: unable to take functional steps ? ? ?Stairs ?  ?  ?  ?  ?  ? ? ?Wheelchair Mobility ?  ? ?Modified Rankin (Stroke Patients Only) ?  ? ? ?  ?Balance Overall balance assessment: Needs assistance, History of Falls ?Sitting-balance support: Feet supported ?Sitting balance-Leahy Scale: Good ?  ?  ?Standing balance support: Bilateral upper extremity supported, During functional activity ?Standing balance-Leahy Scale: Zero ?Standing balance comment: max assist with dense cues and use of RW then without one.  Low invovlement of LE's from pt ?  ?  ?  ?  ?  ?  ?  ?  ?  ?  ?  ?  ? ?  ?Cognition Arousal/Alertness: Awake/alert ?Behavior During Therapy: Anxious, WFL for tasks assessed/performed ?Overall Cognitive Status: History of cognitive impairments - at baseline ?  ?  ?  ?  ?  ?  ?  ?  ?  ?  ?  ?  ?  ?  ?  ?  ?  General Comments: slow to respond to motor and verbal cues ?  ?  ? ?  ?Exercises   ? ?  ?General Comments General comments (skin integrity, edema, etc.): pt had a place on R forearm split during the session which did not receive direct contact from PT but was bruised and fragile, nursing in to cover it ?  ?  ? ?Pertinent Vitals/Pain Pain Assessment ?Pain Assessment: No/denies pain ?Faces Pain Scale: Hurts a little bit ?Pain Location: R arm ?Pain Descriptors / Indicators: Sore  ? ? ?Home  Living   ?  ?  ?  ?  ?  ?  ?  ?  ?  ?   ?  ?Prior Function    ?  ?  ?   ? ?PT Goals (current goals can now be found in the care plan section) Acute Rehab PT Goals ?Patient Stated Goal: to go home ?Progress towards PT goals: Progressing toward goals ? ?  ?Frequency ? ? ? Min 3X/week ? ? ? ?  ?PT Plan Current plan remains appropriate  ? ? ?Co-evaluation   ?  ?  ?  ?  ? ?  ?AM-PAC PT "6 Clicks" Mobility   ?Outcome Measure ? Help needed turning from your back to your side while in a flat bed without using bedrails?: A Lot ?Help needed moving from lying on your back to sitting on the side of a flat bed without using bedrails?: A Lot ?Help needed moving to and from a bed to a chair (including a wheelchair)?: Total ?Help needed standing up from a chair using your arms (e.g., wheelchair or bedside chair)?: Total ?Help needed to walk in hospital room?: Total ?Help needed climbing 3-5 steps with a railing? : Total ?6 Click Score: 8 ? ?  ?End of Session Equipment Utilized During Treatment: Gait belt ?Activity Tolerance: Patient limited by fatigue;Treatment limited secondary to medical complications (Comment) ?Patient left: in bed;with call bell/phone within reach;with bed alarm set;with family/visitor present;with nursing/sitter in room ?Nurse Communication: Mobility status ?PT Visit Diagnosis: Unsteadiness on feet (R26.81);Muscle weakness (generalized) (M62.81);Difficulty in walking, not elsewhere classified (R26.2) ?  ? ? ?Time: 5102-5852 ?PT Time Calculation (min) (ACUTE ONLY): 27 min ? ?Charges:  $Therapeutic Activity: 23-37 mins ?Ramond Dial ?01/02/2022, 2:48 PM ? ?[name], PT PhD ?Acute Rehab Dept. Number: [facility] [phone] and [place] [phone] ? ? ?

## 2022-01-02 NOTE — TOC Transition Note (Signed)
Transition of Care (TOC) - CM/SW Discharge Note ? ? ?Patient Details  ?Name: Kayla Harrison ?MRN: [id number] ?Date of Birth: Mar 15, 1946 ? ?Transition of Care (TOC) CM/SW Contact:  ?Benard Halsted, LCSW ?Phone Number: ?01/02/2022, 11:57 AM ? ? ?Clinical Narrative:    ?Patient will DC to: [facility] ?Anticipated DC date: 01/02/22 ?Family notified: Sister, [name] ?Transport by: Corey Harold ? ? ?Per MD patient ready for DC to [facility]. RN to call report prior to discharge [phone]). RN, patient, patient's family, and facility notified of DC. Discharge Summary and FL2 sent to facility. DC packet on chart including signed DNR. Ambulance transport requested for patient.  ? ?CSW will sign off for now as social work intervention is no longer needed. Please consult [place] again if new needs arise. ? ? ? ? ?Final next level of care: [place] ?Barriers to Discharge: Barriers Resolved ? ? ?Patient Goals and CMS Choice ?Patient states their goals for this hospitalization and ongoing recovery are:: Return to SNF ?CMS Medicare.gov Compare Post Acute Care list provided to:: Patient ?Choice offered to / list presented to : Patient ? ?Discharge Placement ?  ?Existing PASRR number confirmed : 01/02/22          ?Patient [facility] bed at: [facility] ?Patient to be transferred to facility by: [facility] ?Name of family member notified: Sister ?Patient and family notified of of transfer: 01/02/22 ? ?Discharge Plan and Services ?In-house Referral: Clinical Social Work ?  ?Post Acute Care Choice: [place]          ?  ?  ?  ?  ?  ?  ?  ?  ?  ?  ? ?Social Determinants of Health (SDOH) Interventions ?  ? ? ?Readmission Risk Interventions ? ?  12/28/2021  ?  9:28 AM  ?Readmission Risk Prevention Plan  ?Transportation Screening Complete  ?Medication Review Press photographer) Complete  ?PCP or Specialist appointment within 3-5 days of discharge Complete  ?Friend or Home Care Consult Complete  ?SW Recovery Care/Counseling Consult  Complete  ?Palliative Care Screening Not Applicable  ?Skilled Nursing Facility Complete  ? ? ? ? ? ?

## 2022-01-02 NOTE — Progress Notes (Signed)
Pt alert and oriented x4 in no acute distress upon discharge. Pt has taken all belongings with her. Pt transport via stretcher by [facility] back to [facility]. [facility] has pt's discharge paperwork.  ?

## 2022-01-02 NOTE — Discharge Summary (Signed)
Physician Discharge Summary  ?Kayla Harrison:[id number] DOB: 11-Aug-1946 DOA: 12/26/2021 ? ?PCP: [name], MD ? ?Admit date: 12/26/2021 ?Discharge date: 01/02/2022 ? ?Admitted From: Home ?Disposition:  Home ? ?Discharge Condition:Stable ?CODE STATUS:FULL ?Diet recommendation: Heart Healthy  ? ? ?Brief/Interim Summary: ?Patient is a 76 year old female with history of hypothyroidism, hyperlipidemia, hypertension, non Hodgkins lymphoma, chronic A-fib on Eliquis, ESBL UTI who presented from [facility] with complaint of generalized weakness, decreased oral intake, fall , increased urinary frequency, urgency.  There was also report of 45 pounds weight loss in last 4 months.  On presentation, she was in A-fib with RVR.  Chest x-ray showed hazy opacity throughout the right thorax consistent with layering pleural effusion slightly increased from prior.  UA was suspicious for UTI.  Urine culture, blood culture sent.  Patient was admitted for the management of sepsis secondary to UTI.  Hospital course remarkable for persistent tachycardia/a flutter.  Underwent cardioversion on 4/11, currently in normal sinus rhythm..  Plan for discharge back to SNF today. ? ?Following problems were addressed during her hospitalization: ? ?Sepsis secondary to UTI: Patient has history of ESBL UTI.  Presented with increased urgency, dysuria.  Lab work showed leukocytosis.  Started on meropenem due to history of ESBL UTI.  Urine culture showed pansensitive E. coli, antibiotics changed to Keflex, completed course.Blood culture no growth till date ?  ?Chronic A-fib: Presented with RVR.  On amiodarone and Eliquis for anticoagulation.    On rate control with metoprolol as needed.  Hospital course remarkable for persistent tachycardia.   Underwent cardioversion on 4/11, now in normal sinus rhythm ?  ?Chronic diastolic congestive heart failure: Last echo done on 2/23 showed EF of 55 to 60%.  Since she was volume overloaded, started on Lasix  IV.Lasix changed to oral, now stopped.   ?  ?AKI: Resolved with IV fluids ?  ?Hypotension: Started on midodrine.  ?  ?Hypothyroidism: Takes Synthyroid. Elevated TSH of 12.37,elevated free T4.  Discrepancy in the results.  Check TFT in 4 to 6 weeks.  No change will be made to the Synthyroid dose ?  ?Hyperlipidemia: On statin ?  ?Depression: On Zoloft ?  ?Chronic right pleural effusion: No respiratory symptoms.  Continue to monitor ?  ?History of non-Hodgkin's lymphoma: Status postchemotherapy. ?  ?Edema/erythema of right forearm: No DVT or clots as per venous Doppler.  Looked like cellulitis.  Significantly improved ? ?Confusion: Most likely she has underlying dementia.  Patient is intermittently confused, confirmed with the sister..  Continue supportive care ?  ?Weakness/deconditioning: She has H/O stroke.lives at [facility].Has intermittent confusion.We requested  for PT/OT evaluation,recommedned SNF on dc.  Complains of severe weakness.  Currently from [facility].   ? ?Discharge Diagnoses:  ?Principal Problem: ?  UTI (urinary tract infection) ?Active Problems: ?  Sepsis due to gram-negative UTI ([place]) ?  AKI (acute kidney injury) ([place]) ?  Chronic atrial fibrillation with RVR ([facility]) ?  Hypothyroidism ?  Hyponatremia ?  Dyslipidemia ?  Depression ?  Pleural effusion on right ?  Non Hodgkin's lymphoma ([place]) ? ? ? ?Discharge Instructions ? ?Discharge Instructions   ? ? Diet - low sodium heart healthy   Complete by: As directed ?  ? Discharge instructions   Complete by: As directed ?  ? 1)Please take your medications as instructed ?2) Follow up with electrophysiology on 01/28/2022 at 8:40 AM.  Name and number the provider has been attached. ?3)Follow up with your PCP in a week.  Do a BMP,  CBC test during the follow-up  ? Increase activity slowly   Complete by: As directed ?  ? ?  ? ?Allergies as of 01/02/2022   ? ?   Reactions  ? Ranolazine   ? Balance issues  ? Rivaroxaban   ? Nose Bleed X 6 hrs ; packing in ER ?Nasal  hemorrhage  ? Tape Rash, Other (See Comments)  ? SKIN IS THIN AND TEARS EASILY (also does not tolerate paper tape well) ?   ? Tikosyn [dofetilide] Other (See Comments)  ? "Long QT wave"  ? Epinephrine Other (See Comments)  ? Oral anesthetic ?Dental form only (liquid). Patient stated she will become "out of it" she can hear you but cannot respond in normal fashion. ?"not fully with it"  ? Ketamine Other (See Comments)  ? Hallucinations  ? Morphine   ? Other reaction(s): Unknown  ? ?  ? ?  ?Medication List  ?  ? ?STOP taking these medications   ? ?nitrofurantoin (macrocrystal-monohydrate) 100 MG capsule ?Commonly known as: MACROBID ?  ?polyethylene glycol 17 g packet ?Commonly known as: MIRALAX / GLYCOLAX ?  ? ?  ? ?TAKE these medications   ? ?acetaminophen 325 MG tablet ?Commonly known as: TYLENOL ?Take 2 tablets (650 mg total) by mouth every 4 (four) hours as needed for mild pain (or temp > 37.5 C (99.5 F)). ?What changed:  ?when to take this ?additional instructions ?Another medication with the same name was removed. Continue taking this medication, and follow the directions you see here. ?  ?amiodarone 200 MG tablet ?Commonly known as: PACERONE ?Take 1 tablet (200 mg total) by mouth 2 (two) times daily. ?What changed: when to take this ?  ?Biofreeze 4 % Gel ?Generic drug: Menthol (Topical Analgesic) ?Apply 1 application. topically daily. ?  ?CALCIUM + VITAMIN D3 PO ?Take 1 tablet by mouth daily. ?  ?Eliquis 5 MG Tabs tablet ?Generic drug: apixaban ?TAKE 1 TABLET BY MOUTH  TWICE DAILY ?What changed: how much to take ?  ?HYDROcodone-acetaminophen 5-325 MG tablet ?Commonly known as: NORCO/VICODIN ?Take 1 tablet by mouth every 6 (six) hours as needed for moderate pain. And as needed for pain.allow 2 hours between scheduled dose and prn dose. ?What changed:  ?when to take this ?reasons to take this ?  ?levothyroxine 125 MCG tablet ?Commonly known as: SYNTHROID ?Take 1 tablet (125 mcg total) by mouth daily at 6 (six)  AM. ?What changed: when to take this ?  ?Lidocaine 4 % Ptch ?Apply 2 patches topically every other day. ?  ?magnesium oxide 400 (240 Mg) MG tablet ?Commonly known as: MAG-OX ?Take 1 tablet (400 mg total) by mouth daily. ?  ?metoprolol succinate 25 MG 24 hr tablet ?Commonly known as: TOPROL-XL ?Take 12.5 mg by mouth 2 (two) times daily as needed (HR> 100bpm). Take a half tablet twice a day ?  ?midodrine 5 MG tablet ?Commonly known as: PROAMATINE ?Take 1 tablet (5 mg total) by mouth 3 (three) times daily with meals. ?What changed: when to take this ?  ?PRO-STAT SUGAR FREE PO ?Take 30 mLs by mouth daily. ?  ?PROBIOTIC (LACTOBACILLUS) PO ?Take 1 capsule by mouth daily. ?  ?Restasis 0.05 % ophthalmic emulsion ?Generic drug: cycloSPORINE ?Place 1 drop into both eyes daily. ?  ?rosuvastatin 10 MG tablet ?Commonly known as: CRESTOR ?Take 1 tablet (10 mg total) by mouth daily. Mondays & Thursdays ?What changed:  ?when to take this ?additional instructions ?  ?sertraline 25 MG tablet ?Commonly known as:  ZOLOFT ?Take 25 mg by mouth daily. ?  ? ?  ? ? Follow-up Information   ? ? Shirley Friar, PA-C Follow up on 01/28/2022.   ?Specialty: Physician Assistant ?Why: @8 :40am for hospital follow up with Dr. Olin Pia PA/NP. Please arrive 15 minutes early ?Contact information: ?[address] ?[place] [address] ?[phone] ? ? ?  ?  ? ? [name], MD. Schedule an appointment as soon as possible for a visit in 1 week(s).   ?Specialty: Endocrinology ?Contact information: ?[address] ?[place] [address] ?[phone] ? ? ?  ?  ? ?  ?  ? ?  ? ?Allergies  ?Allergen Reactions  ? Ranolazine   ?  Balance issues  ? Rivaroxaban   ?  Nose Bleed X 6 hrs ; packing in ER ?Nasal hemorrhage  ? Tape Rash and Other (See Comments)  ?  SKIN IS THIN AND TEARS EASILY (also does not tolerate paper tape well) ?   ? Tikosyn [Dofetilide] Other (See Comments)  ?  "Long QT wave" ?  ? Epinephrine Other (See Comments)  ?  Oral  anesthetic ?Dental form only (liquid). Patient stated she will become "out of it" she can hear you but cannot respond in normal fashion. ?"not fully with it"  ? Ketamine Other (See Comments)  ?  Hallucinations ?  ? Mo

## 2022-01-02 NOTE — Progress Notes (Signed)
With watery diarrhea should wse test for C diff?  Apparently frpm, the order set it takes an act of congress and I dont have such authoirty ?Thanks SK   ?

## 2022-01-02 NOTE — Progress Notes (Signed)
Report given to Okey Regal at [facility].  ?

## 2022-01-02 NOTE — Progress Notes (Signed)
?Cardiology Progress Note  ?Patient ID: Kayla Harrison ?MRN: [id number] ?DOB: November 17, 1945 ?Date of Encounter: 01/02/2022 ? ?Primary Cardiologist: [name], MD ? ?Subjective  ? ?Chief Complaint: None.  ? ?HPI: Maintaining NSR. No symptoms.  ? ?ROS:  ?All other ROS reviewed and negative. Pertinent positives noted in the HPI.    ? ?Inpatient Medications  ?Scheduled Meds: ? (feeding supplement) PROSource Plus  30 mL Oral BID BM  ? amiodarone  200 mg Oral BID  ? apixaban  5 mg Oral BID  ? calcium-vitamin D  1 tablet Oral Q breakfast  ? cycloSPORINE  1 drop Both Eyes Daily  ? feeding supplement  237 mL Oral BID BM  ? levothyroxine  125 mcg Oral Q0600  ? lidocaine  2 patch Transdermal QODAY  ? magnesium oxide  400 mg Oral Daily  ? metoprolol succinate  50 mg Oral Daily  ? midodrine  10 mg Oral TID WC  ? multivitamin with minerals  1 tablet Oral Daily  ? Muscle Rub  1 application. Topical Daily  ? rosuvastatin  10 mg Oral Once per day on Mon Thu  ? ?Continuous Infusions: ? ?PRN Meds: ?HYDROcodone-acetaminophen  ? ?Vital Signs  ? ?Vitals:  ? 01/02/22 0345 01/02/22 0400 01/02/22 0500 01/02/22 0600  ?BP: 128/82 137/81  138/79  ?Pulse:    70  ?Resp: 16 16 20 20   ?Temp: 98.7 ?F (37.1 ?C)     ?TempSrc: Oral     ?SpO2:      ?Weight:      ?Height:      ? ? ?Intake/Output Summary (Last 24 hours) at 01/02/2022 0711 ?Last data filed at 01/01/2022 1646 ?Gross per 24 hour  ?Intake 340 ml  ?Output 600 ml  ?Net -260 ml  ? ? ?  01/01/2022  ?  8:19 AM 12/29/2021  ?  6:00 AM 12/27/2021  ?  7:49 PM  ?Last 3 Weights  ?Weight (lbs) 190 lb 11.2 oz 190 lb 11.2 oz 197 lb 15.6 oz  ?Weight (kg) 86.5 kg 86.5 kg 89.8 kg  ?   ? ?Telemetry  ?Overnight telemetry shows A paced 70 bpm, which I personally reviewed.  ? ?Physical Exam  ? ?Vitals:  ? 01/02/22 0345 01/02/22 0400 01/02/22 0500 01/02/22 0600  ?BP: 128/82 137/81  138/79  ?Pulse:    70  ?Resp: 16 16 20 20   ?Temp: 98.7 ?F (37.1 ?C)     ?TempSrc: Oral     ?SpO2:      ?Weight:      ?Height:      ?   ?Intake/Output Summary (Last 24 hours) at 01/02/2022 0711 ?Last data filed at 01/01/2022 1646 ?Gross per 24 hour  ?Intake 340 ml  ?Output 600 ml  ?Net -260 ml  ?  ? ?  01/01/2022  ?  8:19 AM 12/29/2021  ?  6:00 AM 12/27/2021  ?  7:49 PM  ?Last 3 Weights  ?Weight (lbs) 190 lb 11.2 oz 190 lb 11.2 oz 197 lb 15.6 oz  ?Weight (kg) 86.5 kg 86.5 kg 89.8 kg  ?  Body mass index is 30.78 kg/m?.  ?General: Well nourished, well developed, in no acute distress ?Head: Atraumatic, normal size  ?Eyes: PEERLA, EOMI  ?Neck: Supple, no JVD ?Endocrine: No thryomegaly ?Cardiac: Normal S1, S2; RRR; no murmurs, rubs, or gallops ?Lungs: Clear to auscultation bilaterally, no wheezing, rhonchi or rales  ?Abd: Soft, nontender, no hepatomegaly  ?Ext: trace edema  ?Musculoskeletal: No deformities, BUE and BLE strength normal and  equal ?Skin: Warm and dry, no rashes   ?Neuro: Alert and oriented to person, place, time, and situation, CNII-XII grossly intact, no focal deficits  ?Psych: Normal mood and affect  ? ?Labs  ?High Sensitivity Troponin:  No results for input(s): TROPONINIHS in the last 720 hours.   ?Cardiac EnzymesNo results for input(s): TROPONINI in the last 168 hours. No results for input(s): TROPIPOC in the last 168 hours.  ?Chemistry ?Recent Labs  ?Lab 12/26/21 ?1637 12/27/21 ?1950 12/30/21 ?0813 12/31/21 ?9326 01/01/22 ?0121  ?NA 131*   < > 134* 135 134*  ?K 4.4   < > 3.6 3.1* 4.3  ?CL 99   < > 99 94* 97*  ?CO2 25   < > 27 33* 30  ?GLUCOSE 101*   < > 81 94 88  ?BUN 10   < > 13 16 17   ?CREATININE 1.02*   < > 0.81 0.91 0.79  ?CALCIUM 8.3*   < > 7.9* 8.1* 8.2*  ?PROT 5.2*  --   --   --   --   ?ALBUMIN 2.8*  --   --   --   --   ?AST 29  --   --   --   --   ?ALT 25  --   --   --   --   ?ALKPHOS 154*  --   --   --   --   ?BILITOT 1.3*  --   --   --   --   ?GFRNONAA 57*   < > >60 >60 >60  ?ANIONGAP 7   < > 8 8 7   ? < > = values in this interval not displayed.  ?  ?Hematology ?Recent Labs  ?Lab 12/27/21 ?7124 12/28/21 ?0139 12/31/21 ?0245  ?WBC  12.6* 10.1 7.4  ?RBC 3.55* 3.61* 3.71*  ?HGB 11.4* 11.7* 12.2  ?HCT 37.5 37.6 37.1  ?MCV 105.6* 104.2* 100.0  ?MCH 32.1 32.4 32.9  ?MCHC 30.4 31.1 32.9  ?RDW 19.0* 18.6* 17.9*  ?PLT 178 165 145*  ? ?BNP ?Recent Labs  ?Lab 12/28/21 ?5809  ?BNP 1,019.7*  ?  ?DDimer No results for input(s): DDIMER in the last 168 hours.  ? ?Radiology  ?CUP PACEART REMOTE DEVICE CHECK ? ?Result Date: 01/01/2022 ?Scheduled remote reviewed. Normal device function.  Presenting rhythm ST (atrial rate approx 113bpm) with 2nd HB, Type I/Wenckebach in AAIR mode (MVP). [place]- Eliquis Next remote 91 days- JJB  ? ?Cardiac Studies  ?TTE 11/13/2021 1. Left ventricular ejection fraction, by estimation, is 55 to 60%. The  ?left ventricle has normal function. The left ventricle has no regional  ?wall motion abnormalities. Indeterminate diastolic filling due to E-A  ?fusion. There is the interventricular  ?septum is flattened in diastole ('D' shaped left ventricle), consistent  ?with right ventricular volume overload.  ? 2. Right ventricular systolic function is mildly reduced. The right  ?ventricular size is mildly enlarged. There is mildly elevated pulmonary  ?artery systolic pressure.  ? 3. Left atrial size was moderately dilated.  ? 4. The mitral valve is degenerative. Moderate mitral valve regurgitation.  ?Mild mitral stenosis. The mean mitral valve gradient is 5.0 mmHg with  ?average heart rate of 122 bpm. Severe mitral annular calcification.  ? 5. Tricuspid valve regurgitation is severe.  ? 6. The aortic valve is normal in structure. There is mild calcification  ?of the aortic valve. There is mild thickening of the aortic valve. Aortic  ?valve regurgitation is not visualized. Aortic valve sclerosis is  present,  ?with no evidence of aortic valve  ?stenosis.  ? 7. The inferior vena cava is dilated in size with >50% respiratory  ?variability, suggesting right atrial pressure of 8 mmHg.  ? ?Patient Profile  ?76 year old female with hypertension,  hyperlipidemia, non-Hodgkin's lymphoma, persistent atrial fibrillation status post ablation in 2008/2014, complete heart block requiring permanent pacemaker, stroke, orthostatic hypotension, lung cancer status post chemoradiation, degenerative mitral valve disease who was admitted on 12/26/2021 with weakness and fatigue and found to have urinary tract infection.  Cardiology consulted on 12/30/2021 for tachycardia and decompensated diastolic heart failure. ? ?Assessment & Plan  ? ?#Atrial flutter ?-s/p dccv ?-stop dig ?-continue amiodarone  ?-on eliquis ?-go back to metop as needed (home med) ? ?#HFpEF ?-lasix as needed ? ?#Orthostatic hypotension ?-Blood pressure much improved in sinus rhythm.  We will just continue with midodrine.  Can consider fludrocortisone as an outpatient. ?-reduce midodrine to 5 mg TID ? ?[facility] will sign off.   ?Medication Recommendations:  As above ?Other recommendations (labs, testing, etc):  None ?Follow up as an outpatient: We will arrange follow-up with Dr. Caryl Comes in 2 to 4 weeks. ? ?For questions or updates, please contact [place] ?Please consult [email or web address] for contact info under  ? ?Signed, ?[place] T. Audie Box, MD, [facility] ?[place]  ?01/02/2022 7:11 AM  ? ?

## 2022-01-03 DIAGNOSIS — F03A0 Unspecified dementia, mild, without behavioral disturbance, psychotic disturbance, mood disturbance, and anxiety: Secondary | ICD-10-CM | POA: Diagnosis not present

## 2022-01-03 DIAGNOSIS — Z950 Presence of cardiac pacemaker: Secondary | ICD-10-CM | POA: Diagnosis not present

## 2022-01-03 DIAGNOSIS — R41841 Cognitive communication deficit: Secondary | ICD-10-CM | POA: Diagnosis not present

## 2022-01-03 DIAGNOSIS — E785 Hyperlipidemia, unspecified: Secondary | ICD-10-CM | POA: Diagnosis not present

## 2022-01-03 DIAGNOSIS — I4891 Unspecified atrial fibrillation: Secondary | ICD-10-CM | POA: Diagnosis not present

## 2022-01-03 DIAGNOSIS — I119 Hypertensive heart disease without heart failure: Secondary | ICD-10-CM | POA: Diagnosis not present

## 2022-01-03 DIAGNOSIS — B9620 Unspecified Escherichia coli [E. coli] as the cause of diseases classified elsewhere: Secondary | ICD-10-CM | POA: Diagnosis not present

## 2022-01-03 DIAGNOSIS — M6281 Muscle weakness (generalized): Secondary | ICD-10-CM | POA: Diagnosis not present

## 2022-01-03 DIAGNOSIS — N390 Urinary tract infection, site not specified: Secondary | ICD-10-CM | POA: Diagnosis not present

## 2022-01-03 DIAGNOSIS — I5033 Acute on chronic diastolic (congestive) heart failure: Secondary | ICD-10-CM | POA: Diagnosis not present

## 2022-01-03 DIAGNOSIS — I509 Heart failure, unspecified: Secondary | ICD-10-CM | POA: Diagnosis not present

## 2022-01-03 DIAGNOSIS — Z9181 History of falling: Secondary | ICD-10-CM | POA: Diagnosis not present

## 2022-01-03 DIAGNOSIS — F39 Unspecified mood [affective] disorder: Secondary | ICD-10-CM | POA: Diagnosis not present

## 2022-01-03 DIAGNOSIS — I951 Orthostatic hypotension: Secondary | ICD-10-CM | POA: Diagnosis not present

## 2022-01-03 DIAGNOSIS — I5032 Chronic diastolic (congestive) heart failure: Secondary | ICD-10-CM | POA: Diagnosis not present

## 2022-01-03 DIAGNOSIS — M25551 Pain in right hip: Secondary | ICD-10-CM | POA: Diagnosis not present

## 2022-01-03 DIAGNOSIS — E039 Hypothyroidism, unspecified: Secondary | ICD-10-CM | POA: Diagnosis not present

## 2022-01-03 DIAGNOSIS — I959 Hypotension, unspecified: Secondary | ICD-10-CM | POA: Diagnosis not present

## 2022-01-03 DIAGNOSIS — R634 Abnormal weight loss: Secondary | ICD-10-CM | POA: Diagnosis not present

## 2022-01-03 DIAGNOSIS — R5381 Other malaise: Secondary | ICD-10-CM | POA: Diagnosis not present

## 2022-01-03 DIAGNOSIS — C8590 Non-Hodgkin lymphoma, unspecified, unspecified site: Secondary | ICD-10-CM | POA: Diagnosis not present

## 2022-01-03 DIAGNOSIS — J90 Pleural effusion, not elsewhere classified: Secondary | ICD-10-CM | POA: Diagnosis not present

## 2022-01-06 DIAGNOSIS — Z20822 Contact with and (suspected) exposure to covid-19: Secondary | ICD-10-CM | POA: Diagnosis not present

## 2022-01-07 DIAGNOSIS — I509 Heart failure, unspecified: Secondary | ICD-10-CM | POA: Diagnosis not present

## 2022-01-07 DIAGNOSIS — M25551 Pain in right hip: Secondary | ICD-10-CM | POA: Diagnosis not present

## 2022-01-07 DIAGNOSIS — I951 Orthostatic hypotension: Secondary | ICD-10-CM | POA: Diagnosis not present

## 2022-01-07 DIAGNOSIS — R41841 Cognitive communication deficit: Secondary | ICD-10-CM | POA: Diagnosis not present

## 2022-01-07 DIAGNOSIS — Z950 Presence of cardiac pacemaker: Secondary | ICD-10-CM | POA: Diagnosis not present

## 2022-01-07 DIAGNOSIS — I4891 Unspecified atrial fibrillation: Secondary | ICD-10-CM | POA: Diagnosis not present

## 2022-01-07 DIAGNOSIS — Z9181 History of falling: Secondary | ICD-10-CM | POA: Diagnosis not present

## 2022-01-07 DIAGNOSIS — I119 Hypertensive heart disease without heart failure: Secondary | ICD-10-CM | POA: Diagnosis not present

## 2022-01-07 DIAGNOSIS — M6281 Muscle weakness (generalized): Secondary | ICD-10-CM | POA: Diagnosis not present

## 2022-01-09 DIAGNOSIS — M6281 Muscle weakness (generalized): Secondary | ICD-10-CM | POA: Diagnosis not present

## 2022-01-09 DIAGNOSIS — Z9181 History of falling: Secondary | ICD-10-CM | POA: Diagnosis not present

## 2022-01-09 DIAGNOSIS — I951 Orthostatic hypotension: Secondary | ICD-10-CM | POA: Diagnosis not present

## 2022-01-09 DIAGNOSIS — R41841 Cognitive communication deficit: Secondary | ICD-10-CM | POA: Diagnosis not present

## 2022-01-09 DIAGNOSIS — Z950 Presence of cardiac pacemaker: Secondary | ICD-10-CM | POA: Diagnosis not present

## 2022-01-09 DIAGNOSIS — A0471 Enterocolitis due to Clostridium difficile, recurrent: Secondary | ICD-10-CM | POA: Diagnosis not present

## 2022-01-09 DIAGNOSIS — I509 Heart failure, unspecified: Secondary | ICD-10-CM | POA: Diagnosis not present

## 2022-01-09 DIAGNOSIS — I119 Hypertensive heart disease without heart failure: Secondary | ICD-10-CM | POA: Diagnosis not present

## 2022-01-09 DIAGNOSIS — I4891 Unspecified atrial fibrillation: Secondary | ICD-10-CM | POA: Diagnosis not present

## 2022-01-09 DIAGNOSIS — M25551 Pain in right hip: Secondary | ICD-10-CM | POA: Diagnosis not present

## 2022-01-10 ENCOUNTER — Other Ambulatory Visit: Payer: Self-pay | Admitting: *Deleted

## 2022-01-10 ENCOUNTER — Ambulatory Visit (INDEPENDENT_AMBULATORY_CARE_PROVIDER_SITE_OTHER): Payer: Medicare Other | Admitting: Neurology

## 2022-01-10 VITALS — BP 122/64 | HR 69

## 2022-01-10 DIAGNOSIS — Z23 Encounter for immunization: Secondary | ICD-10-CM | POA: Diagnosis not present

## 2022-01-10 DIAGNOSIS — M6281 Muscle weakness (generalized): Secondary | ICD-10-CM | POA: Diagnosis not present

## 2022-01-10 DIAGNOSIS — R5381 Other malaise: Secondary | ICD-10-CM

## 2022-01-10 DIAGNOSIS — I4811 Longstanding persistent atrial fibrillation: Secondary | ICD-10-CM | POA: Diagnosis not present

## 2022-01-10 DIAGNOSIS — Z9181 History of falling: Secondary | ICD-10-CM | POA: Diagnosis not present

## 2022-01-10 DIAGNOSIS — I509 Heart failure, unspecified: Secondary | ICD-10-CM | POA: Diagnosis not present

## 2022-01-10 DIAGNOSIS — M25551 Pain in right hip: Secondary | ICD-10-CM | POA: Diagnosis not present

## 2022-01-10 DIAGNOSIS — Z8673 Personal history of transient ischemic attack (TIA), and cerebral infarction without residual deficits: Secondary | ICD-10-CM | POA: Diagnosis not present

## 2022-01-10 DIAGNOSIS — I4891 Unspecified atrial fibrillation: Secondary | ICD-10-CM | POA: Diagnosis not present

## 2022-01-10 DIAGNOSIS — I951 Orthostatic hypotension: Secondary | ICD-10-CM | POA: Diagnosis not present

## 2022-01-10 DIAGNOSIS — R41841 Cognitive communication deficit: Secondary | ICD-10-CM | POA: Diagnosis not present

## 2022-01-10 DIAGNOSIS — A0471 Enterocolitis due to Clostridium difficile, recurrent: Secondary | ICD-10-CM | POA: Diagnosis not present

## 2022-01-10 DIAGNOSIS — I119 Hypertensive heart disease without heart failure: Secondary | ICD-10-CM | POA: Diagnosis not present

## 2022-01-10 DIAGNOSIS — Z950 Presence of cardiac pacemaker: Secondary | ICD-10-CM | POA: Diagnosis not present

## 2022-01-10 NOTE — Patient Instructions (Signed)
I had a long discussion with the patient and caregiver regarding her recent prolonged hospitalization for sepsis UTI and deconditioning.  Continue ongoing stay in [facility] rehabilitation center with daily physical and Occupational Therapy.  Continue Eliquis for stroke prevention for her A-fib.  Strict control of hypertension blood pressure goal below 140/90 and lipids with LDL cholesterol goal below 70 mg percent.  Return for follow-up in the future in 6 months or call earlier if necessary ?

## 2022-01-10 NOTE — Patient Outreach (Signed)
[facility] Post- Acute Care Coordinator follow up. Per [place] eligible member currently resides in [facility].  Screened for potential [facility] Care Management services as a benefit of member's insurance plan. ? ?Member returned to [facility] on 01/02/22 after re-hospitalization. ? ?Facility site visit to [facility] skilled nursing facility. Met with Kirstin, SNF SW concerning member's transition plan and potential [facility] needs. Kirstin reports member is requiring increased assistance. States member still wants ALF upon SNF discharge. Transition plans pending member's progress with therapy. ? ?Will continue to follow while member resides in SNF. ? ? ? ?[name], MSN, RN,BSN ?[place] Coordinator ?[phone] [facility]) ?[phone]  (Toll free office)  ?

## 2022-01-10 NOTE — Progress Notes (Signed)
?[facility] ?[address] ?[place]. [place] [address] ?[phone] ? ?     OFFICE FOLLOW-UP NOTE ? ?[name] ?Date of Birth:  [birth year] ?Medical Record Number:  [id number]  ? ?HPI: Initial visit 06/27/2021: [name] is a pleasant 76 year old Caucasian lady seen today for initial office visit following hospital admission for intracerebral hemorrhage in June 2022.  History is obtained from the patient and review of electronic medical records and I personally reviewed pertinent available imaging films in PACS.Kayla Harrison is a 76 y.o. female with a history of hyperlipidemia, atrial fibrillation on Eliquis, lymphoma, who presents with confusion.  She was initially found down on 03/15/2021, and told the physicians that she slipped off the bed and could not get back up.  She was down for several hours. ? She apparently had some problems remembering these events while she was over in the emergency department, but recovered quickly..In the emergency department, she was taken for head CT which demonstrates a subcortical hemorrhage on the left with intraventricular extension.  Her blood pressure was not elevated and was 122/72 and remained normal throughout hospital stay.  She had only mild right sided drift and weakness on arrival.  CT head showed a small left caudothalamic groove intracerebral hemorrhage with extending into the left lateral ventricle with mild dilatation of the left lateral ventricle with 5 mm left-to-right midline shift.  There was moderate age-related atrophy.  Neurosurgery was consulted but patient did not need ventriculostomy.  LDL cholesterol was 85 mg percent and hemoglobin A1c was 5.5.  She had a 2D echo on 03/09/2021 with ejection fraction of 55 to 60% and hence was not repeated.  MRI could not be obtained as she had a pacemaker.  CT angiogram of the head and neck was obtained which showed no large vessel stenosis, aneurysm or AVM.  She did well and was  transferred to inpatient rehab where she had a short stay and subsequently has been discharged home.  She is living at home by herself.  She still feels her walking and her strength and balance are not good.  She has been using a wheeled walker but still feels little unsteady.  She has had a few minor falls but fortunately no major injuries.  She states her blood pressure has been under good control and has never been high and she has not been on blood pressure medicines.  She started taking a baby aspirin now and Eliquis was discontinued in the hospital stay.  Patient seems interested in considering participation in the ASPIRE study (aspirin versus Eliquis for patients with history of intracerebral hemorrhage and atrial fibrillation) ?Update 07/12/2022 : Patient returns for follow-up after last visit nearly 6 months ago.  She is accompanied by caregiver from rehab facility.  Patient denies any recurrent stroke or TIA.  She was placed back on Eliquis which is tolerating well with bruising but no major bleeding.  She became sick and had decreased appetite and lost weight and was admitted to [facility] from 12/26/2021 to 01/03/2022 with sepsis and UTI.  She was physically deconditioned and has been transferred to [facility] and rehab center.  She is getting physical and Occupational Therapy but she is still barely able to stand and is unable to walk yet.  She denies any focal weakness but states her legs are not strong enough for her to walk.  She is hopeful to recover and be able to go back and live alone. ?ROS:   ?  14 system review of systems is positive for imbalance, dizziness, weakness, unsteadiness, difficulty walking all other systems negative ? ?PMH:  ?Past Medical History:  ?Diagnosis Date  ? Anemia   ? Arthritis   ? osteoarthritis - knees and right shoulder  ? Blood transfusion without reported diagnosis   ? Breast cancer ([place])   ? Dr Margot Chimes, total thyroidectomy- 1999- for cancer  ? Brucellosis 1964   ? Chronic bilateral pleural effusions   ? Colon polyp   ? Dr Earlean Shawl  ? Complication of anesthesia   ? Ketamine produces LSD reaction, bright colored nightmarish experience   ? Dyslipidemia   ? Endometriosis   ? Fibroids   ? H/O pleural effusion   ? s/p thoracentesis w 323ml withdrawn  ? Hematoma 11/12/2021  ? Hepatitis   ? [place] ?hepatitis   ? History of dysphagia   ? due to radiation therapy  ? History of hiatal hernia   ? small noted on PET scan  ? Hypertension   ? Hypothyroidism   ? Lung cancer, lower lobe ([place]) 01/2017  ? radiation RX completed 03/04/17; will start chemo 6/27, pt unaware of lung cancer  ? Morbid obesity ([place])   ? Status post lap band surgery  ? Nephrolithiasis   ? Non Hodgkin's lymphoma ([place])   ? on chemotherapy  ? Persistent atrial fibrillation ([place])   ? a. s/p PVI 2008 b. s/p convergent ablation 8786 complicated by bradycardia requiring pacemaker implant  ? Personal history of radiation therapy   ? Presence of permanent cardiac pacemaker   ? Rotator cuff tear   ? Right  ? Stroke [facility])   ? 2003- [place] x2  ? SVC syndrome   ? with lung mass and non hodgkins lymphoma  ? Thyroid cancer ([place]) 2000  ? ? ?Social History:  ?Social History  ? ?Socioeconomic History  ? Marital status: Single  ?  Spouse name: Not on file  ? Number of children: 0  ? Years of education: Not on file  ? Highest education level: Master's degree (e.g., MA, MS, MEng, MEd, MSW, MBA)  ?Occupational History  ? 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 [place]  ?  Employer: RETIRED  ?Tobacco Use  ? Smoking status: Never  ? Smokeless tobacco: Never  ?Vaping Use  ? Vaping Use: Never used  ?Substance and Sexual Activity  ? Alcohol use: No  ?  Comment: none since 1990  ? Drug use: Never  ? Sexual activity: Not Currently  ?  Birth control/protection: Surgical  ?Other Topics Concern  ? Not on file  ?Social History Narrative  ? ** Merged History Encounter **  ?    ? SINGLE  ?NO CHILDREN ?NEVER  SMOKED ?EXERCISE 3 X WK ?RETIRED ?NO CAFFEINE ? ? ?  ? ?Social Determinants of Health  ? ?Financial Resource Strain: Not on file  ?Food Insecurity: Not on file  ?Transportation Needs: Not on file  ?Physical Activity: Not on file  ?Stress: Not on file  ?Social Connections: Not on file  ?Intimate Partner Violence: Not on file  ? ? ?Medications:   ?Current Outpatient Medications on File Prior to Visit  ?Medication Sig Dispense Refill  ? acetaminophen (TYLENOL) 325 MG tablet Take 2 tablets (650 mg total) by mouth every 4 (four) hours as needed for mild pain (or temp > 37.5 C (99.5 F)). (Patient taking differently: Take 650 mg by mouth every 8 (eight) hours. And as needed for pain.)    ?  Amino Acids-Protein Hydrolys (PRO-STAT SUGAR FREE PO) Take 30 mLs by mouth daily.    ? amiodarone (PACERONE) 200 MG tablet Take 1 tablet (200 mg total) by mouth 2 (two) times daily.    ? apixaban (ELIQUIS) 5 MG TABS tablet TAKE 1 TABLET BY MOUTH  TWICE DAILY (Patient taking differently: Take 5 mg by mouth 2 (two) times daily.) 180 tablet 1  ? Calcium Carb-Cholecalciferol (CALCIUM + VITAMIN D3 PO) Take 1 tablet by mouth daily.    ? HYDROcodone-acetaminophen (NORCO/VICODIN) 5-325 MG tablet Take 1 tablet by mouth every 6 (six) hours as needed for moderate pain. And as needed for pain.allow 2 hours between scheduled dose and prn dose. 30 tablet 0  ? levothyroxine (SYNTHROID) 125 MCG tablet Take 1 tablet (125 mcg total) by mouth daily at 6 (six) AM. (Patient taking differently: Take 125 mcg by mouth daily before breakfast.)    ? Lidocaine 4 % PTCH Apply 2 patches topically every other day.    ? magnesium oxide (MAG-OX) 400 (240 Mg) MG tablet Take 1 tablet (400 mg total) by mouth daily.    ? Menthol, Topical Analgesic, (BIOFREEZE) 4 % GEL Apply 1 application. topically daily.    ? metoprolol succinate (TOPROL-XL) 25 MG 24 hr tablet Take 12.5 mg by mouth 2 (two) times daily as needed (HR> 100bpm). Take a half tablet twice a day    ? midodrine  (PROAMATINE) 5 MG tablet Take 1 tablet (5 mg total) by mouth 3 (three) times daily with meals.    ? PROBIOTIC, LACTOBACILLUS, PO Take 1 capsule by mouth daily.    ? RESTASIS 0.05 % ophthalmic emulsion P

## 2022-01-13 ENCOUNTER — Other Ambulatory Visit: Payer: Self-pay | Admitting: Internal Medicine

## 2022-01-14 ENCOUNTER — Other Ambulatory Visit: Payer: Self-pay | Admitting: Nurse Practitioner

## 2022-01-14 DIAGNOSIS — I509 Heart failure, unspecified: Secondary | ICD-10-CM | POA: Diagnosis not present

## 2022-01-14 DIAGNOSIS — Z950 Presence of cardiac pacemaker: Secondary | ICD-10-CM | POA: Diagnosis not present

## 2022-01-14 DIAGNOSIS — I951 Orthostatic hypotension: Secondary | ICD-10-CM | POA: Diagnosis not present

## 2022-01-14 DIAGNOSIS — R41841 Cognitive communication deficit: Secondary | ICD-10-CM | POA: Diagnosis not present

## 2022-01-14 DIAGNOSIS — R748 Abnormal levels of other serum enzymes: Secondary | ICD-10-CM | POA: Diagnosis not present

## 2022-01-14 DIAGNOSIS — I4891 Unspecified atrial fibrillation: Secondary | ICD-10-CM | POA: Diagnosis not present

## 2022-01-14 DIAGNOSIS — M25551 Pain in right hip: Secondary | ICD-10-CM | POA: Diagnosis not present

## 2022-01-14 DIAGNOSIS — R932 Abnormal findings on diagnostic imaging of liver and biliary tract: Secondary | ICD-10-CM | POA: Diagnosis not present

## 2022-01-14 DIAGNOSIS — I119 Hypertensive heart disease without heart failure: Secondary | ICD-10-CM | POA: Diagnosis not present

## 2022-01-14 DIAGNOSIS — A0471 Enterocolitis due to Clostridium difficile, recurrent: Secondary | ICD-10-CM | POA: Diagnosis not present

## 2022-01-14 DIAGNOSIS — M6281 Muscle weakness (generalized): Secondary | ICD-10-CM | POA: Diagnosis not present

## 2022-01-14 DIAGNOSIS — Z9181 History of falling: Secondary | ICD-10-CM | POA: Diagnosis not present

## 2022-01-16 DIAGNOSIS — A0472 Enterocolitis due to Clostridium difficile, not specified as recurrent: Secondary | ICD-10-CM | POA: Diagnosis not present

## 2022-01-16 DIAGNOSIS — Z9181 History of falling: Secondary | ICD-10-CM | POA: Diagnosis not present

## 2022-01-16 DIAGNOSIS — I509 Heart failure, unspecified: Secondary | ICD-10-CM | POA: Diagnosis not present

## 2022-01-16 DIAGNOSIS — I5033 Acute on chronic diastolic (congestive) heart failure: Secondary | ICD-10-CM | POA: Diagnosis not present

## 2022-01-16 DIAGNOSIS — D500 Iron deficiency anemia secondary to blood loss (chronic): Secondary | ICD-10-CM | POA: Diagnosis not present

## 2022-01-16 DIAGNOSIS — I951 Orthostatic hypotension: Secondary | ICD-10-CM | POA: Diagnosis not present

## 2022-01-16 DIAGNOSIS — Z950 Presence of cardiac pacemaker: Secondary | ICD-10-CM | POA: Diagnosis not present

## 2022-01-16 DIAGNOSIS — R41841 Cognitive communication deficit: Secondary | ICD-10-CM | POA: Diagnosis not present

## 2022-01-16 DIAGNOSIS — I959 Hypotension, unspecified: Secondary | ICD-10-CM | POA: Diagnosis not present

## 2022-01-16 DIAGNOSIS — M25551 Pain in right hip: Secondary | ICD-10-CM | POA: Diagnosis not present

## 2022-01-16 DIAGNOSIS — N390 Urinary tract infection, site not specified: Secondary | ICD-10-CM | POA: Diagnosis not present

## 2022-01-16 DIAGNOSIS — J90 Pleural effusion, not elsewhere classified: Secondary | ICD-10-CM | POA: Diagnosis not present

## 2022-01-16 DIAGNOSIS — I4891 Unspecified atrial fibrillation: Secondary | ICD-10-CM | POA: Diagnosis not present

## 2022-01-16 DIAGNOSIS — I119 Hypertensive heart disease without heart failure: Secondary | ICD-10-CM | POA: Diagnosis not present

## 2022-01-16 DIAGNOSIS — A0471 Enterocolitis due to Clostridium difficile, recurrent: Secondary | ICD-10-CM | POA: Diagnosis not present

## 2022-01-16 DIAGNOSIS — M6281 Muscle weakness (generalized): Secondary | ICD-10-CM | POA: Diagnosis not present

## 2022-01-17 DIAGNOSIS — I951 Orthostatic hypotension: Secondary | ICD-10-CM | POA: Diagnosis not present

## 2022-01-17 DIAGNOSIS — Z9181 History of falling: Secondary | ICD-10-CM | POA: Diagnosis not present

## 2022-01-17 DIAGNOSIS — I4891 Unspecified atrial fibrillation: Secondary | ICD-10-CM | POA: Diagnosis not present

## 2022-01-17 DIAGNOSIS — I119 Hypertensive heart disease without heart failure: Secondary | ICD-10-CM | POA: Diagnosis not present

## 2022-01-17 DIAGNOSIS — I509 Heart failure, unspecified: Secondary | ICD-10-CM | POA: Diagnosis not present

## 2022-01-17 DIAGNOSIS — M25551 Pain in right hip: Secondary | ICD-10-CM | POA: Diagnosis not present

## 2022-01-17 DIAGNOSIS — R41841 Cognitive communication deficit: Secondary | ICD-10-CM | POA: Diagnosis not present

## 2022-01-17 DIAGNOSIS — A0471 Enterocolitis due to Clostridium difficile, recurrent: Secondary | ICD-10-CM | POA: Diagnosis not present

## 2022-01-17 DIAGNOSIS — M6281 Muscle weakness (generalized): Secondary | ICD-10-CM | POA: Diagnosis not present

## 2022-01-17 DIAGNOSIS — Z950 Presence of cardiac pacemaker: Secondary | ICD-10-CM | POA: Diagnosis not present

## 2022-01-17 NOTE — Progress Notes (Signed)
Remote pacemaker transmission.   

## 2022-01-18 DIAGNOSIS — R932 Abnormal findings on diagnostic imaging of liver and biliary tract: Secondary | ICD-10-CM | POA: Diagnosis not present

## 2022-01-18 DIAGNOSIS — Z8619 Personal history of other infectious and parasitic diseases: Secondary | ICD-10-CM | POA: Diagnosis not present

## 2022-01-18 DIAGNOSIS — K7689 Other specified diseases of liver: Secondary | ICD-10-CM | POA: Diagnosis not present

## 2022-01-18 DIAGNOSIS — R5383 Other fatigue: Secondary | ICD-10-CM | POA: Diagnosis not present

## 2022-01-18 DIAGNOSIS — R059 Cough, unspecified: Secondary | ICD-10-CM | POA: Diagnosis not present

## 2022-01-21 DIAGNOSIS — Z20822 Contact with and (suspected) exposure to covid-19: Secondary | ICD-10-CM | POA: Diagnosis not present

## 2022-01-21 DIAGNOSIS — R509 Fever, unspecified: Secondary | ICD-10-CM | POA: Diagnosis not present

## 2022-01-21 DIAGNOSIS — I959 Hypotension, unspecified: Secondary | ICD-10-CM | POA: Diagnosis not present

## 2022-01-21 DIAGNOSIS — R059 Cough, unspecified: Secondary | ICD-10-CM | POA: Diagnosis not present

## 2022-01-21 DIAGNOSIS — J90 Pleural effusion, not elsewhere classified: Secondary | ICD-10-CM | POA: Diagnosis not present

## 2022-01-21 DIAGNOSIS — J9601 Acute respiratory failure with hypoxia: Secondary | ICD-10-CM | POA: Diagnosis not present

## 2022-01-21 DIAGNOSIS — A0472 Enterocolitis due to Clostridium difficile, not specified as recurrent: Secondary | ICD-10-CM | POA: Diagnosis not present

## 2022-01-21 NOTE — Progress Notes (Signed)
? ? ?Electrophysiology Office Note ?Date: 01/28/2022 ? ?ID:  Kayla Harrison, DOB 1946-03-06, MRN [id number] ? ?PCP: [name], MD ?Primary Cardiologist: [name], MD ?Electrophysiologist: [name], MD  ? ?CC: Pacemaker follow-up ? ?Kayla Harrison is a 76 y.o. female seen today for [name], MD for post hospital follow up.   ? ?Admitted 4/5 - 4/12 with sepsis 2/2 UTI and with AF RVR. She had been back on [facility] and was thus cardioverted after treatment for her UTI. EF 55-60% 10/2021. ? ?Since discharge from hospital the patient reports doing OK. Remains dizzy quite frequently. Today, she has not received her midodrine yet. Systolic BP in upper 87N lower 80s.  She is essentially WC bound per staff, not able to walk very far at all, and request significant assistance. she denies chest pain, palpitations, dyspnea, PND, orthopnea, nausea, vomiting, edema, weight gain, or early satiety. ? ?  ?DATE TEST EF    ?2011 Echo  55 % LAE 40 mm  ?7/14 Echo  55-60 %    ?9/18 Echo  55-60%    ?12/18 Echo  55-60% Severe LAE ?  ?11/20 TEE 55%  moderate MR/TR mild MS  ?6/22 Echo  55-60%    ?  ?  ?Remote morbid obesity status>>  post lap band surgery  ?  ?  ?Thromboembolic risk factors ( age -77 , TIA/CVA-2, Gender-1) for a CHADSVASc Score of 5 ? ?Device History ?Medtronic Dual Chamber PPM 2014 for SSS/Tachy brady 2014 ?  ? ?Past Medical History:  ?Diagnosis Date  ? Anemia   ? Arthritis   ? osteoarthritis - knees and right shoulder  ? Blood transfusion without reported diagnosis   ? Breast cancer ([place])   ? Dr Margot Chimes, total thyroidectomy- 1999- for cancer  ? Brucellosis 1964  ? Chronic bilateral pleural effusions   ? Colon polyp   ? Dr Earlean Shawl  ? Complication of anesthesia   ? Ketamine produces LSD reaction, bright colored nightmarish experience   ? Dyslipidemia   ? Endometriosis   ? Fibroids   ? H/O pleural effusion   ? s/p thoracentesis w 3260m withdrawn  ? Hematoma 11/12/2021  ? Hepatitis   ? [place] a teen-  [place], ?hepatitis   ? History of dysphagia   ? due to radiation therapy  ? History of hiatal hernia   ? small noted on PET scan  ? Hypertension   ? Hypothyroidism   ? [place]  ? radiation RX completed 03/04/17; will start chemo 6/27, pt unaware of lung cancer  ? [place]   ? Status post lap band surgery  ? Nephrolithiasis   ? [place]   ? on chemotherapy  ? [place]istent [place]   ? a. s/p PVI 2008 b. s/p convergent ablation 27972complicated by bradycardia requiring pacemaker implant  ? Personal history of radiation therapy   ? Presence of permanent cardiac pacemaker   ? Rotator cuff tear   ? Right  ? Stroke ([facility]   ? [place]  ? SVC syndrome   ? with lung mass and non hodgkins lymphoma  ? [place]  ? ?Past Surgical History:  ?Procedure Laterality Date  ? ABDOMINAL HYSTERECTOMY  1983  ? afib ablation    ? a. 2008 PVI b. 2014 convergent ablation  ? APPENDECTOMY    ? BONE MARROW BIOPSY  02/21/2017  ? BREAST LUMPECTOMY Left 2010  ?  bso  1998  ? CARDIAC CATHETERIZATION    ? 2015- negative  ? CARDIOVERSION  10/09/2012  ? Procedure: CARDIOVERSION;  Surgeon: Minus Breeding, MD;  Location: [place];  Service: Cardiovascular;  Laterality: N/A;  ? CARDIOVERSION  10/09/2012  ? Procedure: CARDIOVERSION;  Surgeon: Minus Breeding, MD;  Location: [facility] ENDOSCOPY;  Service: Cardiovascular;  Laterality: N/A;  [name] gave the ok to add pt to the add on , but we must check to find out if the can add pt on at [phone] ( [phone])  ? CARDIOVERSION N/A 11/20/2012  ? Procedure: CARDIOVERSION;  Surgeon: Fay Records, MD;  Location: [place];  Service: Cardiovascular;  Laterality: N/A;  ? CARDIOVERSION N/A 07/18/2017  ? Procedure: CARDIOVERSION;  Surgeon: Thayer Headings, MD;  Location: [place];  Service: Cardiovascular;  Laterality: N/A;  ? CARDIOVERSION N/A 10/03/2017  ? Procedure: CARDIOVERSION;  Surgeon: Sanda Klein, MD;   Location: [place];  Service: Cardiovascular;  Laterality: N/A;  ? CARDIOVERSION N/A 01/07/2018  ? Procedure: CARDIOVERSION;  Surgeon: Thayer Headings, MD;  Location: [place];  Service: Cardiovascular;  Laterality: N/A;  ? CARDIOVERSION N/A 12/10/2019  ? Procedure: CARDIOVERSION;  Surgeon: Buford Dresser, MD;  Location: [place];  Service: Cardiovascular;  Laterality: N/A;  ? CARDIOVERSION N/A 03/09/2021  ? Procedure: CARDIOVERSION;  Surgeon: Pixie Casino, MD;  Location: [place];  Service: Cardiovascular;  Laterality: N/A;  ? CARDIOVERSION N/A 10/04/2021  ? Procedure: CARDIOVERSION;  Surgeon: Jerline Pain, MD;  Location: [facility] ENDOSCOPY;  Service: Cardiovascular;  Laterality: N/A;  ? CARDIOVERSION N/A 01/01/2022  ? Procedure: CARDIOVERSION;  Surgeon: Sanda Klein, MD;  Location: [facility] ENDOSCOPY;  Service: Cardiovascular;  Laterality: N/A;  ? CHOLECYSTECTOMY    ? COLONOSCOPY W/ POLYPECTOMY    ? Dr Earlean Shawl  ? CYSTOSCOPY N/A 02/06/2015  ? Procedure: CYSTOSCOPY;  Surgeon: Kathie Rhodes, MD;  Location: [facility];  Service: Urology;  Laterality: N/A;  ? CYSTOSCOPY W/ RETROGRADES Left 11/17/2017  ? Procedure: CYSTOSCOPY WITH RETROGRADE /PYELOGRAM/;  Surgeon: Kathie Rhodes, MD;  Location: [facility];  Service: Urology;  Laterality: Left;  ? CYSTOSCOPY WITH RETROGRADE PYELOGRAM, URETEROSCOPY AND STENT PLACEMENT Right 02/06/2015  ? Procedure: RETROGRADE PYELOGRAM, RIGHT URETEROSCOPY STENT PLACEMENT;  Surgeon: Kathie Rhodes, MD;  Location: [facility];  Service: Urology;  Laterality: Right;  ? CYSTOSCOPY WITH RETROGRADE PYELOGRAM, URETEROSCOPY AND STENT PLACEMENT Right 03/07/2017  ? Procedure: CYSTOSCOPY WITH RIGHT RETROGRADE PYELOGRAM,RIGHT  URETEROSCOPYLASER LITHOTRIPSY  AND STENT PLACEMENT AND STONE BASKETRY;  Surgeon: Kathie Rhodes, MD;  Location: [place];  Service: Urology;  Laterality: Right;  ? EYE SURGERY    ? cataract surgery  ? fatty mass removal  1999  ? pubic area  ? HOLMIUM LASER  APPLICATION N/A 8/75/6433  ? Procedure: HOLMIUM LASER APPLICATION;  Surgeon: Kathie Rhodes, MD;  Location: [facility];  Service: Urology;  Laterality: N/A;  ? HOLMIUM LASER APPLICATION Right 2/95/1884  ? Procedure: HOLMIUM LASER APPLICATION;  Surgeon: Kathie Rhodes, MD;  Location: [facility];  Service: Urology;  Laterality: Right;  ? HOLMIUM LASER APPLICATION Left 1/66/0630  ? Procedure: HOLMIUM LASER APPLICATION;  Surgeon: Kathie Rhodes, MD;  Location: [facility];  Service: Urology;  Laterality: Left;  ? I & D EXTREMITY Left 12/19/2020  ? Procedure: IRRIGATION AND DEBRIDEMENT OF LEFT HIP HEMATOMA WITH APPLICATION OF WOUND VAC;  Surgeon: Erle Crocker, MD;  Location: [place];  Service: Orthopedics;  Laterality: Left;  ? I & D EXTREMITY Left 02/06/2021  ? Procedure: IRRIGATION  AND DEBRIDEMENT DEEP ABCESS LEFT THIGH, SECONDARY CLOSURE OF WOUND DEHISCENCE;  Surgeon: Erle Crocker, MD;  Location: [place];  Service: Orthopedics;  Laterality: Left;  ? IR FLUORO GUIDE PORT INSERTION RIGHT  02/24/2017  ? IR NEPHROSTOMY PLACEMENT RIGHT  11/17/2017  ? IR PATIENT EVAL TECH 0-60 MINS  03/11/2017  ? IR REMOVAL TUN ACCESS W/ PORT W/O FL MOD SED  04/20/2018  ? IR US GUIDE VASC ACCESS RIGHT  02/24/2017  ? KNEE ARTHROSCOPY    ? bilateral  ? LAPAROSCOPIC GASTRIC BANDING  07/10/2010  ? LAPAROSCOPIC GASTRIC BANDING    ? Laparoscopic adjustable banding APS System with posterior hiatal hernia, 2 suture.  ? LAPAROTOMY    ? for ruptured ovary and ovarian artery   ? NEPHROLITHOTOMY Right 11/17/2017  ? Procedure: NEPHROLITHOTOMY PERCUTANEOUS;  Surgeon: Kathie Rhodes, MD;  Location: [facility];  Service: Urology;  Laterality: Right;  ? PACEMAKER INSERTION  03/10/2013  ? MDT dual chamber PPM  ? POCKET REVISION N/A 12/08/2013  ? Procedure: POCKET REVISION;  Surgeon: Deboraha Sprang, MD;  Location: [facility] CATH LAB;  Service: Cardiovascular;  Laterality: N/A;  ? PORTA CATH INSERTION    ? REVERSE SHOULDER ARTHROPLASTY Right 05/14/2018  ? Procedure:  RIGHT REVERSE SHOULDER ARTHROPLASTY;  Surgeon: Tania Ade, MD;  Location: [place];  Service: Orthopedics;  Laterality: Right;  ? REVERSE SHOULDER REPLACEMENT Right 05/14/2018  ? RIGHT HEART CATH N/A 07/21/2019  ? Pr

## 2022-01-22 DIAGNOSIS — Z20822 Contact with and (suspected) exposure to covid-19: Secondary | ICD-10-CM | POA: Diagnosis not present

## 2022-01-22 DIAGNOSIS — B9620 Unspecified Escherichia coli [E. coli] as the cause of diseases classified elsewhere: Secondary | ICD-10-CM | POA: Diagnosis not present

## 2022-01-22 DIAGNOSIS — R262 Difficulty in walking, not elsewhere classified: Secondary | ICD-10-CM | POA: Diagnosis not present

## 2022-01-22 DIAGNOSIS — Z8673 Personal history of transient ischemic attack (TIA), and cerebral infarction without residual deficits: Secondary | ICD-10-CM | POA: Diagnosis not present

## 2022-01-22 DIAGNOSIS — Z8619 Personal history of other infectious and parasitic diseases: Secondary | ICD-10-CM | POA: Diagnosis not present

## 2022-01-22 DIAGNOSIS — C8590 Non-Hodgkin lymphoma, unspecified, unspecified site: Secondary | ICD-10-CM | POA: Diagnosis not present

## 2022-01-22 DIAGNOSIS — E785 Hyperlipidemia, unspecified: Secondary | ICD-10-CM | POA: Diagnosis not present

## 2022-01-22 DIAGNOSIS — I951 Orthostatic hypotension: Secondary | ICD-10-CM | POA: Diagnosis not present

## 2022-01-22 DIAGNOSIS — R5381 Other malaise: Secondary | ICD-10-CM | POA: Diagnosis not present

## 2022-01-22 DIAGNOSIS — J90 Pleural effusion, not elsewhere classified: Secondary | ICD-10-CM | POA: Diagnosis not present

## 2022-01-22 DIAGNOSIS — K7689 Other specified diseases of liver: Secondary | ICD-10-CM | POA: Diagnosis not present

## 2022-01-22 DIAGNOSIS — E039 Hypothyroidism, unspecified: Secondary | ICD-10-CM | POA: Diagnosis not present

## 2022-01-22 DIAGNOSIS — R4189 Other symptoms and signs involving cognitive functions and awareness: Secondary | ICD-10-CM | POA: Diagnosis not present

## 2022-01-22 DIAGNOSIS — K760 Fatty (change of) liver, not elsewhere classified: Secondary | ICD-10-CM | POA: Diagnosis not present

## 2022-01-22 DIAGNOSIS — Z950 Presence of cardiac pacemaker: Secondary | ICD-10-CM | POA: Diagnosis not present

## 2022-01-22 DIAGNOSIS — R197 Diarrhea, unspecified: Secondary | ICD-10-CM | POA: Diagnosis not present

## 2022-01-22 DIAGNOSIS — I4891 Unspecified atrial fibrillation: Secondary | ICD-10-CM | POA: Diagnosis not present

## 2022-01-23 DIAGNOSIS — I509 Heart failure, unspecified: Secondary | ICD-10-CM | POA: Diagnosis not present

## 2022-01-23 DIAGNOSIS — M6281 Muscle weakness (generalized): Secondary | ICD-10-CM | POA: Diagnosis not present

## 2022-01-23 DIAGNOSIS — Z950 Presence of cardiac pacemaker: Secondary | ICD-10-CM | POA: Diagnosis not present

## 2022-01-23 DIAGNOSIS — R0902 Hypoxemia: Secondary | ICD-10-CM | POA: Diagnosis not present

## 2022-01-23 DIAGNOSIS — Z9181 History of falling: Secondary | ICD-10-CM | POA: Diagnosis not present

## 2022-01-23 DIAGNOSIS — I119 Hypertensive heart disease without heart failure: Secondary | ICD-10-CM | POA: Diagnosis not present

## 2022-01-23 DIAGNOSIS — R195 Other fecal abnormalities: Secondary | ICD-10-CM | POA: Diagnosis not present

## 2022-01-23 DIAGNOSIS — I4891 Unspecified atrial fibrillation: Secondary | ICD-10-CM | POA: Diagnosis not present

## 2022-01-23 DIAGNOSIS — R059 Cough, unspecified: Secondary | ICD-10-CM | POA: Diagnosis not present

## 2022-01-23 DIAGNOSIS — I5032 Chronic diastolic (congestive) heart failure: Secondary | ICD-10-CM | POA: Diagnosis not present

## 2022-01-23 DIAGNOSIS — R0600 Dyspnea, unspecified: Secondary | ICD-10-CM | POA: Diagnosis not present

## 2022-01-23 DIAGNOSIS — A0471 Enterocolitis due to Clostridium difficile, recurrent: Secondary | ICD-10-CM | POA: Diagnosis not present

## 2022-01-23 DIAGNOSIS — R41841 Cognitive communication deficit: Secondary | ICD-10-CM | POA: Diagnosis not present

## 2022-01-23 DIAGNOSIS — M25551 Pain in right hip: Secondary | ICD-10-CM | POA: Diagnosis not present

## 2022-01-23 DIAGNOSIS — J189 Pneumonia, unspecified organism: Secondary | ICD-10-CM | POA: Diagnosis not present

## 2022-01-23 DIAGNOSIS — I951 Orthostatic hypotension: Secondary | ICD-10-CM | POA: Diagnosis not present

## 2022-01-24 DIAGNOSIS — I119 Hypertensive heart disease without heart failure: Secondary | ICD-10-CM | POA: Diagnosis not present

## 2022-01-24 DIAGNOSIS — Z711 Person with feared health complaint in whom no diagnosis is made: Secondary | ICD-10-CM | POA: Diagnosis not present

## 2022-01-24 DIAGNOSIS — R5381 Other malaise: Secondary | ICD-10-CM | POA: Diagnosis not present

## 2022-01-24 DIAGNOSIS — J9601 Acute respiratory failure with hypoxia: Secondary | ICD-10-CM | POA: Diagnosis not present

## 2022-01-24 DIAGNOSIS — I951 Orthostatic hypotension: Secondary | ICD-10-CM | POA: Diagnosis not present

## 2022-01-24 DIAGNOSIS — R238 Other skin changes: Secondary | ICD-10-CM | POA: Diagnosis not present

## 2022-01-24 DIAGNOSIS — I4891 Unspecified atrial fibrillation: Secondary | ICD-10-CM | POA: Diagnosis not present

## 2022-01-24 DIAGNOSIS — I509 Heart failure, unspecified: Secondary | ICD-10-CM | POA: Diagnosis not present

## 2022-01-24 DIAGNOSIS — M25551 Pain in right hip: Secondary | ICD-10-CM | POA: Diagnosis not present

## 2022-01-24 DIAGNOSIS — R9389 Abnormal findings on diagnostic imaging of other specified body structures: Secondary | ICD-10-CM | POA: Diagnosis not present

## 2022-01-24 DIAGNOSIS — A0471 Enterocolitis due to Clostridium difficile, recurrent: Secondary | ICD-10-CM | POA: Diagnosis not present

## 2022-01-24 DIAGNOSIS — Z9181 History of falling: Secondary | ICD-10-CM | POA: Diagnosis not present

## 2022-01-24 DIAGNOSIS — M6281 Muscle weakness (generalized): Secondary | ICD-10-CM | POA: Diagnosis not present

## 2022-01-24 DIAGNOSIS — R41841 Cognitive communication deficit: Secondary | ICD-10-CM | POA: Diagnosis not present

## 2022-01-24 DIAGNOSIS — Z950 Presence of cardiac pacemaker: Secondary | ICD-10-CM | POA: Diagnosis not present

## 2022-01-28 ENCOUNTER — Ambulatory Visit (INDEPENDENT_AMBULATORY_CARE_PROVIDER_SITE_OTHER): Payer: Medicare Other | Admitting: Student

## 2022-01-28 ENCOUNTER — Encounter: Payer: Self-pay | Admitting: Student

## 2022-01-28 ENCOUNTER — Telehealth: Payer: Self-pay

## 2022-01-28 VITALS — BP 76/53 | HR 70

## 2022-01-28 DIAGNOSIS — Z950 Presence of cardiac pacemaker: Secondary | ICD-10-CM | POA: Diagnosis not present

## 2022-01-28 DIAGNOSIS — R41841 Cognitive communication deficit: Secondary | ICD-10-CM | POA: Diagnosis not present

## 2022-01-28 DIAGNOSIS — Z9181 History of falling: Secondary | ICD-10-CM | POA: Diagnosis not present

## 2022-01-28 DIAGNOSIS — I495 Sick sinus syndrome: Secondary | ICD-10-CM | POA: Diagnosis not present

## 2022-01-28 DIAGNOSIS — M25551 Pain in right hip: Secondary | ICD-10-CM | POA: Diagnosis not present

## 2022-01-28 DIAGNOSIS — M6281 Muscle weakness (generalized): Secondary | ICD-10-CM | POA: Diagnosis not present

## 2022-01-28 DIAGNOSIS — I509 Heart failure, unspecified: Secondary | ICD-10-CM | POA: Diagnosis not present

## 2022-01-28 DIAGNOSIS — I119 Hypertensive heart disease without heart failure: Secondary | ICD-10-CM | POA: Diagnosis not present

## 2022-01-28 DIAGNOSIS — D6869 Other thrombophilia: Secondary | ICD-10-CM

## 2022-01-28 DIAGNOSIS — I951 Orthostatic hypotension: Secondary | ICD-10-CM

## 2022-01-28 DIAGNOSIS — I4891 Unspecified atrial fibrillation: Secondary | ICD-10-CM | POA: Diagnosis not present

## 2022-01-28 DIAGNOSIS — I4819 Other persistent atrial fibrillation: Secondary | ICD-10-CM | POA: Diagnosis not present

## 2022-01-28 DIAGNOSIS — A0471 Enterocolitis due to Clostridium difficile, recurrent: Secondary | ICD-10-CM | POA: Diagnosis not present

## 2022-01-28 LAB — CUP PACEART INCLINIC DEVICE CHECK
Battery Remaining Longevity: 18 mo
Battery Voltage: 2.91 V
Brady Statistic AP VP Percent: 0.04 %
Brady Statistic AP VS Percent: 97.19 %
Brady Statistic AS VP Percent: 0 %
Brady Statistic AS VS Percent: 2.76 %
Brady Statistic RA Percent Paced: 97.21 %
Brady Statistic RV Percent Paced: 0.05 %
Date Time Interrogation Session: 20230508091503
Implantable Lead Implant Date: 20140618
Implantable Lead Implant Date: 20140618
Implantable Lead Location: 753859
Implantable Lead Location: 753860
Implantable Pulse Generator Implant Date: 20140618
Lead Channel Impedance Value: 1064 Ohm
Lead Channel Impedance Value: 418 Ohm
Lead Channel Impedance Value: 494 Ohm
Lead Channel Impedance Value: 988 Ohm
Lead Channel Pacing Threshold Amplitude: 1.125 V
Lead Channel Pacing Threshold Amplitude: 1.625 V
Lead Channel Pacing Threshold Pulse Width: 0.4 ms
Lead Channel Pacing Threshold Pulse Width: 0.4 ms
Lead Channel Sensing Intrinsic Amplitude: 0.875 mV
Lead Channel Sensing Intrinsic Amplitude: 1.5 mV
Lead Channel Sensing Intrinsic Amplitude: 13 mV
Lead Channel Sensing Intrinsic Amplitude: 18.5 mV
Lead Channel Setting Pacing Amplitude: 2.5 V
Lead Channel Setting Pacing Amplitude: 3 V
Lead Channel Setting Pacing Pulse Width: 0.8 ms
Lead Channel Setting Sensing Sensitivity: 0.9 mV

## 2022-01-28 NOTE — Patient Instructions (Signed)
Medication Instructions:  ?Your physician has recommended you make the following change in your medication:  ? ?DECREASE: Amiodarone to 200mg  daily ? ?*If you need a refill on your cardiac medications before your next appointment, please call your pharmacy* ? ? ?Lab Work: ?None  ?If you have labs (blood work) drawn today and your tests are completely normal, you will receive your results only by: ?MyChart Message (if you have MyChart) OR ?A paper copy in the mail ?If you have any lab test that is abnormal or we need to change your treatment, we will call you to review the results. ? ? ?Follow-Up: ?At [facility], you and your health needs are our priority.  As part of our continuing mission to provide you with exceptional heart care, we have created designated Provider Care Teams.  These Care Teams include your primary Cardiologist (physician) and Advanced Practice Providers (APPs -  Physician Assistants and Nurse Practitioners) who all work together to provide you with the care you need, when you need it. ? ? ?Your next appointment:   ?6 month(s) ? ?The format for your next appointment:   ?In Person ? ?Provider:   ?[name], MD  ?  ?

## 2022-01-28 NOTE — Telephone Encounter (Signed)
(  1:15 pm) PC SW returned call to patient's [name], who was requesting a meeting with the provider while she is in town visiting patient. SW advised [name] that Dr. Mariea Clonts is out of the office today. SW will follow-up with her (Dr. Mariea Clonts) when she returns and then follow-up with her  [name]) regarding at time to meet. ?

## 2022-01-30 ENCOUNTER — Non-Acute Institutional Stay: Payer: Medicare Other | Admitting: Internal Medicine

## 2022-01-30 ENCOUNTER — Encounter: Payer: Self-pay | Admitting: Internal Medicine

## 2022-01-30 VITALS — BP 132/58 | HR 56 | Temp 97.8°F | Resp 16 | Wt 149.7 lb

## 2022-01-30 DIAGNOSIS — Z515 Encounter for palliative care: Secondary | ICD-10-CM | POA: Diagnosis not present

## 2022-01-30 DIAGNOSIS — R634 Abnormal weight loss: Secondary | ICD-10-CM | POA: Diagnosis not present

## 2022-01-30 DIAGNOSIS — I951 Orthostatic hypotension: Secondary | ICD-10-CM | POA: Diagnosis not present

## 2022-01-30 DIAGNOSIS — I5032 Chronic diastolic (congestive) heart failure: Secondary | ICD-10-CM | POA: Diagnosis not present

## 2022-01-30 DIAGNOSIS — R1319 Other dysphagia: Secondary | ICD-10-CM | POA: Diagnosis not present

## 2022-01-30 DIAGNOSIS — M25551 Pain in right hip: Secondary | ICD-10-CM | POA: Diagnosis not present

## 2022-01-30 DIAGNOSIS — J90 Pleural effusion, not elsewhere classified: Secondary | ICD-10-CM | POA: Diagnosis not present

## 2022-01-30 DIAGNOSIS — I4819 Other persistent atrial fibrillation: Secondary | ICD-10-CM | POA: Diagnosis not present

## 2022-01-30 DIAGNOSIS — M6281 Muscle weakness (generalized): Secondary | ICD-10-CM | POA: Diagnosis not present

## 2022-01-30 DIAGNOSIS — R41841 Cognitive communication deficit: Secondary | ICD-10-CM | POA: Diagnosis not present

## 2022-01-30 DIAGNOSIS — Z9181 History of falling: Secondary | ICD-10-CM | POA: Diagnosis not present

## 2022-01-30 DIAGNOSIS — I4891 Unspecified atrial fibrillation: Secondary | ICD-10-CM | POA: Diagnosis not present

## 2022-01-30 DIAGNOSIS — I509 Heart failure, unspecified: Secondary | ICD-10-CM | POA: Diagnosis not present

## 2022-01-30 DIAGNOSIS — A0471 Enterocolitis due to Clostridium difficile, recurrent: Secondary | ICD-10-CM | POA: Diagnosis not present

## 2022-01-30 DIAGNOSIS — Z950 Presence of cardiac pacemaker: Secondary | ICD-10-CM | POA: Diagnosis not present

## 2022-01-30 DIAGNOSIS — I119 Hypertensive heart disease without heart failure: Secondary | ICD-10-CM | POA: Diagnosis not present

## 2022-01-30 NOTE — Progress Notes (Signed)
[facility] Palliative Care Consult Note Telephone: [phone]  Fax: [phone]   Date of encounter: 01/30/22 10:35 AM PATIENT NAME: Kayla Harrison [address] [place] [address]   [phone] (home)  DOB: 1946-09-09 MRN: [id number] PRIMARY CARE PROVIDER:    [name], MD,  [place] [address] [phone]  REFERRING PROVIDER:   Earmon Harrison, Kayla Gell, MD ([place])  RESPONSIBLE PARTY:    Contact Information     Name Relation Home Work Mobile   Kayla Harrison Sister   [phone]   Kayla Harrison Friend [phone]     Kayla Harrison Friend   [phone]        I met face to face with patient and family in [facility]. Palliative Care was asked to follow this patient by consultation request of Kayla Phoenix, NP to address advance care planning and complex medical decision making. This is the initial visit.                                     ASSESSMENT AND PLAN / RECOMMENDATIONS:   Advance Care Planning/Goals of Care: Goals include to maximize quality of life and symptom management. Patient/health care surrogate gave his/her permission to discuss.Our advance care planning conversation included a discussion about:    The value and importance of advance care planning  Experiences with loved ones who have been seriously ill or have died  Exploration of personal, cultural or spiritual beliefs that might influence medical decisions  Exploration of goals of care in the event of a sudden injury or illness  Identification  of a healthcare agent--Kayla Harrison (youngest sister who lives in [place] but here now) with backup Kayla Harrison (local sister) Review and updating or creation of an  advance directive document . Decision not to resuscitate or to de-escalate disease focused treatments due to poor prognosis. CODE STATUS:  DNR, MOST:  DNR, comfort measures, no IVF, tube feeding or  antibiotics.  Symptom Management/Plan: 1. Chronic heart failure with preserved ejection fraction (HCC) -has longstanding pleural effusion, this is worsened by bouts of rapid afib despite two ablations and cardioversion during last admission -cont current diuretics (may be able to reduce as intake continues to decline), afib regimen  2. Persistent atrial fibrillation (HCC) -irregular but normal rate today, continue current meds including eliquis at this point, but family is supportive of weaning off meds for patient as she tolerates and focusing purely on comfort over time  3. Pleural effusion on right -for several months, felt to be CHF related, continue oxygen, but family may be willing to d/c  4. Dysphagia causing pulmonary aspiration with swallowing -on mechanical soft, but offer items she enjoys at this time and family did not want to put her through the swallow study any longer  5.  Abnormal weight loss -down 40 lbs 4 mos. Due to terminal condition -cachexia related to CHF and now aspiration -was receiving supplements   5. Palliative care by specialist -met with sisters and resident today and determined that ideally patient would receive hospice at home; however, 24x7 care there is not feasible by family so she will stay at [facility] -referral being sent on to [facility] hospice in long-term care -MOST updated from full/full/abx, IVF, trial TF to DNR, comfort, no IV fluids, abx or tube feeding -family notes that patient is not a valid reporter of her pain or condition  at this time due to the cognitive decline she's had from her strokes now complicated by some delirium (hypoxia/terminal disease) -discontinued supplements and statin therapy   Follow up Palliative Care Visit:  Discussed with social worker at facility.  Primary team placing hospice referral for patient.  This visit was coded based on medical decision making (MDM).  65 minutes spent on ACP.    PPS: 20%  HOSPICE  ELIGIBILITY/DIAGNOSIS: TBD  Chief Complaint: initial palliative consult  HISTORY OF PRESENT ILLNESS:  Kayla Harrison is a 76 y.o. year old female  with HFpEF, afib with prior ablation x 2 and recent cardioversion, moderate right pleural effusion, prior lung, breast and thyroid cancers that were treated, NHL with mediastinal mass, anemia, OA on chronic pain regimen, cerebrovascular disease with prior strokes including intracranial hemorrhage in June, fatty liver disease with elevated transaminases, among others seen for initial palliative consult.  Nursing, therapy and two sisters all report that family has not done well since return from hospitalization in April when she had UTI with sepsis.  That was complicated by afib with RVR that required cardioversion.  She's continued to struggle with pleural effusions and, 5/1, was noted to have hypoxia as low as 75% on RA and low grade temp of 99.1.  CXR confirmed pneumonia--she initially received augmentin x 1 day, then completed a course of doxycycline.  She continues with a wet cough which is made worse during and after meals despite a new mechanical soft diet.  A swallow study has been ordered to further evaluate.  She is sleeping most of the day and night.   Family tells me that she has not walked since feb and now her ankles are beginning to contract and she has stage 1 areas on her heels so prevalon boots are in use.  She has lost 40 lbs and intake is bites.  She aspirates.  She has really declined since her [facility] in June that required an admission at [facility] and subsequent [facility] rehab and later stay here at [facility] place.  She then went home in May with 2 hrs of CNA help which pt eventually discontinued in Nov.  Dec to Feb, mobility was minimal and she began having frequent falls at home and med alert was being used with frequent EMS calls to pick her up.    She's had her afib for 30 yrs with amiodarone, two ablations one of which was during a sternotomy as  part of a [facility] clinical trial many years ago.    History obtained from review of EMR, discussion with primary team, and interview with family, facility staff/caregiver and/or Ms. Silverthorne.  I reviewed available labs, medications, imaging, studies and related documents from the EMR.  Records reviewed and summarized above.   ROS  General: NAD EYES: denies vision changes ENMT: has dysphagia Cardiovascular: denies chest pain, has DOE Pulmonary: denies cough, has increased SOB just sitting up on the side of the bed, using 2.5 L of O2 via  at this time Abdomen: endorses poor appetite, denies constipation, endorses incontinence of bowel GU: denies dysuria, endorses incontinence of urine MSK:  has increased weakness,  no longer ambulatory or even transferring Skin: stage 1s on heels, prevalon boots, large skin tear right forearm  Neurological: denies pain but has chronic pain per family, denies insomnia Psych: Endorses positive mood Heme/lymph/immuno: easily bruises, abnormal bleeding on eliquis  Physical Exam: Current and past weights:  149.7 lbs Constitutional: NAD General: frail appearing, thin, some edema of arms,  pale EYES: anicteric sclera, lids intact, no discharge  ENMT: intact hearing, oral mucous membranes moist, dentition intact CV: irreg irreg, no LE edema at present Pulmonary: coarse rales with diminished right base, left coarse rhonchi throughout posterior lung field, increased work of breathing with just sitting up and talking, dry cough, 2.5L via [place] Abdomen: intake bites, normo-active BS + 4 quadrants, soft and non tender, no ascites GU: deferred MSK: sarcopenia, moves all extremities, nonambulatory, contractures developing in ankles,  Skin: warm and dry, prevalon boots in place, skin tear with dressing in place on right forearm Neuro:  generalized weakness,   cognitive impairment Psych: non-anxious affect, A and O to person, place, not time Hem/lymph/immuno: no  widespread bruising  CURRENT PROBLEM LIST:  Patient Active Problem List   Diagnosis Date Noted   UTI (urinary tract infection) 12/26/2021   Sepsis due to gram-negative UTI ([place]) 12/26/2021   AKI (acute kidney injury) ([place]) 12/26/2021   Chronic atrial fibrillation with RVR ([place]) 12/26/2021   Depression 12/26/2021   Dyslipidemia 12/26/2021   Hyponatremia 12/26/2021   History of fall 11/13/2021   History of hypotension 11/13/2021   Pressure ulcer with suspected deep tissue injury, stage II ([place]) 11/13/2021   Prolonged QT interval 11/13/2021   Traumatic hematoma of buttock 11/12/2021   Emphysematous cystitis 11/12/2021   Abnormal finding on imaging of liver 11/12/2021   Hypertension    Pressure injury of skin 03/15/2021   Intracranial hemorrhage (HCC) 03/14/2021   Lower extremity edema    Polymicrobial bacterial infection    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    Enterococcus faecalis infection    Sepsis due to Escherichia coli ([place])    Debility 02/13/2021   Infection of wound hematoma 02/05/2021   Physical debility 12/22/2020   Tear of left hamstring 12/22/2020   Labral tear of left hip joint 12/22/2020   Acute blood loss anemia    Orthostatic hypotension 12/17/2020   History of CVA (cerebrovascular accident) 12/17/2020   Fall 12/15/2020   Hip hematoma, left, initial encounter 12/15/2020   Nondisplaced fracture of coronoid process of left ulna, initial encounter for closed fracture 12/15/2020   Multiple rib fractures 12/15/2020   COVID-19 virus infection 10/12/2020   Constipation 06/22/2020   Change in bowel habits 06/22/2020   Diverticulosis 06/22/2020   Dark stools 06/22/2020   Hemorrhoids 06/22/2020   Pacemaker 01/23/2020   Junctional rhythm 01/23/2020   Palpitations 01/23/2020   ICH (intracerebral hemorrhage) ([place]) 12/02/2019   A-fib ([place]) 12/02/2019   Anemia 12/02/2019   QT prolongation 12/02/2019   Hypothyroidism 12/02/2019   Gait abnormality 07/10/2018    S/P reverse total shoulder arthroplasty, 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   Persistent atrial fibrillation ([place])    Bilateral ureteral calculi 11/17/2017   Atrial fibrillation with RVR ([place]) 09/15/2017   Atrial flutter ([place]) 07/17/2017   Port catheter in place 04/02/2017   Non Hodgkin's lymphoma ([place]) 02/13/2017   Mediastinal mass 02/06/2017   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   Abnormal chest x-ray    Lung mass 02/03/2017   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   OSA (obstructive sleep apnea) 02/03/2015   History of renal calculi 11/16/2014   Renal calculi 11/16/2014   Family history of colon cancer 10/26/2014   Mechanical complication due to cardiac pacemaker pulse generator 11/06/2013   Dyspnea 05/12/2013   Hypothyroidism 04/21/2013   Pleural effusion on right 04/19/2013   Long term (current) use of anticoagulants 12/20/2010   EPIDERMOID CYST 08/22/2010   Chronic diastolic heart failure (  [place]) 08/06/2010   SYNCOPE AND COLLAPSE 07/27/2010   OSTEOARTHRITIS, KNEE, RIGHT 03/15/2010   Class 1 obesity due to excess calories with body mass index (BMI) of 33.0 to 33.9 in adult 12/07/2009   NONSPEC ELEVATION OF LEVELS OF TRANSAMINASE/LDH 09/08/2009   BREAST CANCER, HX OF 07/25/2009   COLONIC POLYPS, HX OF 07/25/2009   TUBULOVILLOUS ADENOMA, COLON 04/29/2008   HYPERGLYCEMIA, FASTING 04/29/2008   HYPERLIPIDEMIA 06/22/2007   CARCINOMA, THYROID GLAND, HX OF 06/22/2007   PAST MEDICAL HISTORY:  Active Ambulatory Problems    Diagnosis Date Noted   TUBULOVILLOUS ADENOMA, COLON 04/29/2008   HYPERLIPIDEMIA 06/22/2007   Class 1 obesity due to excess calories with body mass index (BMI) of 33.0 to 33.9 in adult 12/07/2009   Chronic diastolic heart failure ([place]) 08/06/2010   EPIDERMOID CYST 08/22/2010   OSTEOARTHRITIS, KNEE, RIGHT 03/15/2010   SYNCOPE AND COLLAPSE 07/27/2010   HYPERGLYCEMIA, FASTING 04/29/2008   NONSPEC ELEVATION OF LEVELS OF TRANSAMINASE/LDH 09/08/2009   BREAST CANCER, HX OF  07/25/2009   CARCINOMA, THYROID GLAND, HX OF 06/22/2007   COLONIC POLYPS, HX OF 07/25/2009   Long term (current) use of anticoagulants 12/20/2010   Pleural effusion on right 04/19/2013   Hypothyroidism 04/21/2013   Dyspnea 05/12/2013   Mechanical complication due to cardiac pacemaker pulse generator 11/06/2013   Family history of colon cancer 10/26/2014   History of renal calculi 11/16/2014   Renal calculi 11/16/2014   OSA (obstructive sleep apnea) 02/03/2015   Lung mass 02/03/2017   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   Abnormal chest x-ray    Mediastinal mass 02/06/2017   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/2017   Non Hodgkin's lymphoma ([place]) 02/13/2017   Port catheter in place 04/02/2017   Atrial flutter ([place]) 07/17/2017   Atrial fibrillation with RVR ([place]) 09/15/2017   Bilateral ureteral calculi 11/17/2017   Persistent atrial fibrillation (HCC)    S/P reverse total shoulder arthroplasty, 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   Gait abnormality 07/10/2018   ICH (intracerebral hemorrhage) ([place]) 12/02/2019   A-fib ([place]) 12/02/2019   Anemia 12/02/2019   QT prolongation 12/02/2019   Hypothyroidism 12/02/2019   Pacemaker 01/23/2020   Junctional rhythm 01/23/2020   Palpitations 01/23/2020   Constipation 06/22/2020   Change in bowel habits 06/22/2020   Diverticulosis 06/22/2020   Dark stools 06/22/2020   Hemorrhoids 06/22/2020   COVID-19 virus infection 10/12/2020   Fall 12/15/2020   Hip hematoma, left, initial encounter 12/15/2020   Nondisplaced fracture of coronoid process of left ulna, initial encounter for closed fracture 12/15/2020   Multiple rib fractures 12/15/2020   Orthostatic hypotension 12/17/2020   History of CVA (cerebrovascular accident) 12/17/2020   Physical debility 12/22/2020   Tear of left hamstring 12/22/2020   Labral tear of left hip joint 12/22/2020   Acute blood loss anemia    Infection of wound hematoma 02/05/2021   Debility 02/13/2021   Polymicrobial  bacterial infection    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    Enterococcus faecalis infection    Sepsis due to Escherichia coli [facility])    Lower extremity edema    Intracranial hemorrhage ([place]) 03/14/2021   Pressure injury of skin 03/15/2021   Hypertension    Traumatic hematoma of buttock 11/12/2021   Emphysematous cystitis 11/12/2021   Abnormal finding on imaging of liver 11/12/2021   History of fall 11/13/2021   History of hypotension 11/13/2021   Pressure ulcer with suspected deep tissue injury, stage II ([place]) 11/13/2021   Prolonged QT interval 11/13/2021   UTI (urinary  tract infection) 12/26/2021   Sepsis due to gram-negative UTI ([place]) 12/26/2021   AKI (acute kidney injury) ([place]) 12/26/2021   Chronic atrial fibrillation with RVR ([place]) 12/26/2021   Depression 12/26/2021   Dyslipidemia 12/26/2021   Hyponatremia 12/26/2021   Resolved Ambulatory Problems    Diagnosis Date Noted   Atrial fibrillation ([place]) 06/22/2007   CVA 08/14/2009   HEMATURIA 04/29/2008   HOT FLASHES 01/16/2010   BACK PAIN, THORACIC REGION 07/27/2010   RASH-NONVESICULAR 04/29/2008   EDEMA- LOCALIZED 02/13/2010   DYSPNEA/SHORTNESS OF BREATH 07/27/2010   Bleeding into the skin 12/25/2010   Dizziness 10/03/2011   Anticoagulant long-term use 12/16/2011   Unspecified adverse effect of unspecified drug, medicinal and biological substance 02/11/2012   Sinusitis 08/14/2012   Encounter for therapeutic drug monitoring 11/24/2013   Past Medical History:  Diagnosis Date   Arthritis    Blood transfusion without reported diagnosis    Breast cancer ([place])    Brucellosis 1964   Chronic bilateral pleural effusions    Colon polyp    Complication of anesthesia    Endometriosis    Fibroids    H/O pleural effusion    Hematoma 11/12/2021   Hepatitis    History of dysphagia    History of hiatal hernia    Lung cancer, lower lobe ([place]) 01/2017   Morbid obesity ([place])    Nephrolithiasis    Personal history of  radiation therapy    Presence of permanent cardiac pacemaker    Rotator cuff tear    Stroke ([place])    SVC syndrome    Thyroid cancer ([place]) 2000   SOCIAL HX:  Social History   Tobacco Use   Smoking status: Never   Smokeless tobacco: Never  Substance Use Topics   Alcohol use: No    Comment: none since 1990   FAMILY HX:  Family History  Problem Relation Age of Onset   Heart disease Father 56       MI _0    Colon cancer Father        COLON   Heart attack Father    Other Mother        temporal arteritis    Diabetes Sister    Diabetes Brother    Diabetes Paternal Aunt    Diabetes Paternal Grandmother    Esophageal cancer Neg Hx    Inflammatory bowel disease Neg Hx    Liver disease Neg Hx    Pancreatic cancer Neg Hx    Stomach cancer Neg Hx       ALLERGIES:  Allergies  Allergen Reactions   Ranolazine     Balance issues   Rivaroxaban     Nose Bleed X 6 hrs ; packing in ER Nasal hemorrhage   Tape Rash and Other (See Comments)    SKIN IS THIN AND TEARS EASILY (also does not tolerate paper tape well)     Tikosyn [Dofetilide] Other (See Comments)    "Long QT wave"    Epinephrine Other (See Comments)    Oral anesthetic Dental form only (liquid). Patient stated she will become "out of it" she can hear you but cannot respond in normal fashion. "not fully with it"   Ketamine Other (See Comments)    Hallucinations    Morphine     Other reaction(s): Unknown     PERTINENT MEDICATIONS:  Outpatient Encounter Medications as of 01/30/2022  Medication Sig   acetaminophen (TYLENOL) 325 MG tablet Take 2 tablets (650 mg total) by mouth every 4 (four)  hours as needed for mild pain (or temp > 37.5 C (99.5 F)).   albuterol (ACCUNEB) 0.63 MG/3ML nebulizer solution Take by nebulization as needed.   Amino Acids-Protein Hydrolys (PRO-STAT SUGAR FREE PO) Take 30 mLs by mouth daily.   amiodarone (PACERONE) 200 MG tablet Take 1 tablet (200 mg total) by mouth daily.   apixaban  (ELIQUIS) 5 MG TABS tablet TAKE 1 TABLET BY MOUTH  TWICE DAILY   Calcium Carb-Cholecalciferol (CALCIUM + VITAMIN D3 PO) Take 1 tablet by mouth daily.   furosemide (LASIX) 20 MG tablet Take 10 mg by mouth daily.   HYDROcodone-acetaminophen (NORCO/VICODIN) 5-325 MG tablet Take 1 tablet by mouth every 6 (six) hours as needed for moderate pain. And as needed for pain.allow 2 hours between scheduled dose and prn dose.   levothyroxine (SYNTHROID) 125 MCG tablet Take 1 tablet (125 mcg total) by mouth daily at 6 (six) AM. (Patient taking differently: Take 112 mcg by mouth daily at 6 (six) AM.)   Lidocaine 4 % PTCH Apply 2 patches topically every other day.   magnesium oxide (MAG-OX) 400 (240 Mg) MG tablet Take 1 tablet (400 mg total) by mouth daily.   Menthol, Topical Analgesic, (BIOFREEZE) 4 % GEL Apply 1 application. topically daily.   metoprolol succinate (TOPROL-XL) 25 MG 24 hr tablet Take 12.5 mg by mouth 2 (two) times daily as needed (HR> 100bpm). Take a half tablet twice a day   midodrine (PROAMATINE) 5 MG tablet Take 1 tablet (5 mg total) by mouth 3 (three) times daily with meals.   PROBIOTIC, LACTOBACILLUS, PO Take 1 capsule by mouth daily.   RESTASIS 0.05 % ophthalmic emulsion Place 1 drop into both eyes daily.   rosuvastatin (CRESTOR) 10 MG tablet Take 1 tablet (10 mg total) by mouth daily. Mondays & Thursdays   sertraline (ZOLOFT) 25 MG tablet Take 25 mg by mouth daily.   No facility-administered encounter medications on file as of 01/30/2022.   Thank you for the opportunity to participate in the care of Ms. Swartz.  The palliative care team will continue to follow. Please call our office at [phone] if we can be of additional assistance.   Hollace Kinnier, DO   COVID-19 PATIENT SCREENING TOOL Asked and negative response unless otherwise noted:  Have you had symptoms of covid, tested positive or been in contact with someone with symptoms/positive test in the past 5-10 days? no

## 2022-01-31 ENCOUNTER — Other Ambulatory Visit: Payer: Self-pay | Admitting: *Deleted

## 2022-01-31 DIAGNOSIS — Z23 Encounter for immunization: Secondary | ICD-10-CM | POA: Diagnosis not present

## 2022-01-31 DIAGNOSIS — Z853 Personal history of malignant neoplasm of breast: Secondary | ICD-10-CM | POA: Diagnosis not present

## 2022-01-31 DIAGNOSIS — J90 Pleural effusion, not elsewhere classified: Secondary | ICD-10-CM | POA: Diagnosis not present

## 2022-01-31 DIAGNOSIS — Z85118 Personal history of other malignant neoplasm of bronchus and lung: Secondary | ICD-10-CM | POA: Diagnosis not present

## 2022-01-31 DIAGNOSIS — Z8639 Personal history of other endocrine, nutritional and metabolic disease: Secondary | ICD-10-CM | POA: Diagnosis not present

## 2022-01-31 DIAGNOSIS — K635 Polyp of colon: Secondary | ICD-10-CM | POA: Diagnosis not present

## 2022-01-31 DIAGNOSIS — I69891 Dysphagia following other cerebrovascular disease: Secondary | ICD-10-CM | POA: Diagnosis not present

## 2022-01-31 DIAGNOSIS — Z8673 Personal history of transient ischemic attack (TIA), and cerebral infarction without residual deficits: Secondary | ICD-10-CM | POA: Diagnosis not present

## 2022-01-31 DIAGNOSIS — I69818 Other symptoms and signs involving cognitive functions following other cerebrovascular disease: Secondary | ICD-10-CM | POA: Diagnosis not present

## 2022-01-31 DIAGNOSIS — Z8572 Personal history of non-Hodgkin lymphomas: Secondary | ICD-10-CM | POA: Diagnosis not present

## 2022-01-31 DIAGNOSIS — Z85850 Personal history of malignant neoplasm of thyroid: Secondary | ICD-10-CM | POA: Diagnosis not present

## 2022-01-31 DIAGNOSIS — I4819 Other persistent atrial fibrillation: Secondary | ICD-10-CM | POA: Diagnosis not present

## 2022-01-31 DIAGNOSIS — N200 Calculus of kidney: Secondary | ICD-10-CM | POA: Diagnosis not present

## 2022-01-31 DIAGNOSIS — F32A Depression, unspecified: Secondary | ICD-10-CM | POA: Diagnosis not present

## 2022-01-31 DIAGNOSIS — K760 Fatty (change of) liver, not elsewhere classified: Secondary | ICD-10-CM | POA: Diagnosis not present

## 2022-01-31 DIAGNOSIS — J9610 Chronic respiratory failure, unspecified whether with hypoxia or hypercapnia: Secondary | ICD-10-CM | POA: Diagnosis not present

## 2022-01-31 DIAGNOSIS — Z90710 Acquired absence of both cervix and uterus: Secondary | ICD-10-CM | POA: Diagnosis not present

## 2022-01-31 DIAGNOSIS — Z9089 Acquired absence of other organs: Secondary | ICD-10-CM | POA: Diagnosis not present

## 2022-01-31 DIAGNOSIS — I4891 Unspecified atrial fibrillation: Secondary | ICD-10-CM | POA: Diagnosis not present

## 2022-01-31 DIAGNOSIS — J690 Pneumonitis due to inhalation of food and vomit: Secondary | ICD-10-CM | POA: Diagnosis not present

## 2022-01-31 DIAGNOSIS — Z9884 Bariatric surgery status: Secondary | ICD-10-CM | POA: Diagnosis not present

## 2022-01-31 DIAGNOSIS — R627 Adult failure to thrive: Secondary | ICD-10-CM | POA: Diagnosis not present

## 2022-01-31 DIAGNOSIS — Z789 Other specified health status: Secondary | ICD-10-CM | POA: Diagnosis not present

## 2022-01-31 DIAGNOSIS — I5030 Unspecified diastolic (congestive) heart failure: Secondary | ICD-10-CM | POA: Diagnosis not present

## 2022-01-31 DIAGNOSIS — Z6824 Body mass index (BMI) 24.0-24.9, adult: Secondary | ICD-10-CM | POA: Diagnosis not present

## 2022-01-31 DIAGNOSIS — M1990 Unspecified osteoarthritis, unspecified site: Secondary | ICD-10-CM | POA: Diagnosis not present

## 2022-01-31 DIAGNOSIS — D649 Anemia, unspecified: Secondary | ICD-10-CM | POA: Diagnosis not present

## 2022-01-31 DIAGNOSIS — E890 Postprocedural hypothyroidism: Secondary | ICD-10-CM | POA: Diagnosis not present

## 2022-01-31 DIAGNOSIS — N809 Endometriosis, unspecified: Secondary | ICD-10-CM | POA: Diagnosis not present

## 2022-01-31 NOTE — Patient Outreach (Signed)
[facility] Post- Acute Care Coordinator follow up. Per [place] eligible member currently resides in [facility].  Screening for potential [facility] Care Management services as a benefit of member's insurance plan. ? ?Facility site visit to [place] skilled nursing facility. Met with Kirstin, SNF SW concerning member's transition plan. Kirstin reports member has transitioned to private pay and will enroll with hospice services.  ? ?Will sign off. No identifiable [facility] needs.  ? ? ?Kayla Rolling, MSN, RN,BSN ?[place] Coordinator ?[phone] [facility]) ?[phone]  (Toll free office)  ?

## 2022-02-01 DIAGNOSIS — I69818 Other symptoms and signs involving cognitive functions following other cerebrovascular disease: Secondary | ICD-10-CM | POA: Diagnosis not present

## 2022-02-01 DIAGNOSIS — I69891 Dysphagia following other cerebrovascular disease: Secondary | ICD-10-CM | POA: Diagnosis not present

## 2022-02-01 DIAGNOSIS — D649 Anemia, unspecified: Secondary | ICD-10-CM | POA: Diagnosis not present

## 2022-02-01 DIAGNOSIS — I5030 Unspecified diastolic (congestive) heart failure: Secondary | ICD-10-CM | POA: Diagnosis not present

## 2022-02-01 DIAGNOSIS — I4819 Other persistent atrial fibrillation: Secondary | ICD-10-CM | POA: Diagnosis not present

## 2022-02-01 DIAGNOSIS — J90 Pleural effusion, not elsewhere classified: Secondary | ICD-10-CM | POA: Diagnosis not present

## 2022-02-05 DIAGNOSIS — J90 Pleural effusion, not elsewhere classified: Secondary | ICD-10-CM | POA: Diagnosis not present

## 2022-02-05 DIAGNOSIS — I69891 Dysphagia following other cerebrovascular disease: Secondary | ICD-10-CM | POA: Diagnosis not present

## 2022-02-05 DIAGNOSIS — L24A9 Irritant contact dermatitis due friction or contact with other specified body fluids: Secondary | ICD-10-CM | POA: Diagnosis not present

## 2022-02-05 DIAGNOSIS — I5030 Unspecified diastolic (congestive) heart failure: Secondary | ICD-10-CM | POA: Diagnosis not present

## 2022-02-05 DIAGNOSIS — S51002A Unspecified open wound of left elbow, initial encounter: Secondary | ICD-10-CM | POA: Diagnosis not present

## 2022-02-05 DIAGNOSIS — D649 Anemia, unspecified: Secondary | ICD-10-CM | POA: Diagnosis not present

## 2022-02-05 DIAGNOSIS — I4819 Other persistent atrial fibrillation: Secondary | ICD-10-CM | POA: Diagnosis not present

## 2022-02-05 DIAGNOSIS — I69818 Other symptoms and signs involving cognitive functions following other cerebrovascular disease: Secondary | ICD-10-CM | POA: Diagnosis not present

## 2022-02-06 DIAGNOSIS — D649 Anemia, unspecified: Secondary | ICD-10-CM | POA: Diagnosis not present

## 2022-02-06 DIAGNOSIS — I4819 Other persistent atrial fibrillation: Secondary | ICD-10-CM | POA: Diagnosis not present

## 2022-02-06 DIAGNOSIS — I69891 Dysphagia following other cerebrovascular disease: Secondary | ICD-10-CM | POA: Diagnosis not present

## 2022-02-06 DIAGNOSIS — J90 Pleural effusion, not elsewhere classified: Secondary | ICD-10-CM | POA: Diagnosis not present

## 2022-02-06 DIAGNOSIS — I69818 Other symptoms and signs involving cognitive functions following other cerebrovascular disease: Secondary | ICD-10-CM | POA: Diagnosis not present

## 2022-02-06 DIAGNOSIS — I5030 Unspecified diastolic (congestive) heart failure: Secondary | ICD-10-CM | POA: Diagnosis not present

## 2022-02-08 DIAGNOSIS — I4819 Other persistent atrial fibrillation: Secondary | ICD-10-CM | POA: Diagnosis not present

## 2022-02-08 DIAGNOSIS — I69891 Dysphagia following other cerebrovascular disease: Secondary | ICD-10-CM | POA: Diagnosis not present

## 2022-02-08 DIAGNOSIS — D649 Anemia, unspecified: Secondary | ICD-10-CM | POA: Diagnosis not present

## 2022-02-08 DIAGNOSIS — I69818 Other symptoms and signs involving cognitive functions following other cerebrovascular disease: Secondary | ICD-10-CM | POA: Diagnosis not present

## 2022-02-08 DIAGNOSIS — I5030 Unspecified diastolic (congestive) heart failure: Secondary | ICD-10-CM | POA: Diagnosis not present

## 2022-02-08 DIAGNOSIS — J90 Pleural effusion, not elsewhere classified: Secondary | ICD-10-CM | POA: Diagnosis not present

## 2022-02-11 DIAGNOSIS — I4819 Other persistent atrial fibrillation: Secondary | ICD-10-CM | POA: Diagnosis not present

## 2022-02-11 DIAGNOSIS — I5030 Unspecified diastolic (congestive) heart failure: Secondary | ICD-10-CM | POA: Diagnosis not present

## 2022-02-11 DIAGNOSIS — J90 Pleural effusion, not elsewhere classified: Secondary | ICD-10-CM | POA: Diagnosis not present

## 2022-02-11 DIAGNOSIS — D649 Anemia, unspecified: Secondary | ICD-10-CM | POA: Diagnosis not present

## 2022-02-11 DIAGNOSIS — I69891 Dysphagia following other cerebrovascular disease: Secondary | ICD-10-CM | POA: Diagnosis not present

## 2022-02-11 DIAGNOSIS — I69818 Other symptoms and signs involving cognitive functions following other cerebrovascular disease: Secondary | ICD-10-CM | POA: Diagnosis not present

## 2022-02-12 DIAGNOSIS — I4819 Other persistent atrial fibrillation: Secondary | ICD-10-CM | POA: Diagnosis not present

## 2022-02-12 DIAGNOSIS — I69891 Dysphagia following other cerebrovascular disease: Secondary | ICD-10-CM | POA: Diagnosis not present

## 2022-02-12 DIAGNOSIS — S51002A Unspecified open wound of left elbow, initial encounter: Secondary | ICD-10-CM | POA: Diagnosis not present

## 2022-02-12 DIAGNOSIS — I5030 Unspecified diastolic (congestive) heart failure: Secondary | ICD-10-CM | POA: Diagnosis not present

## 2022-02-12 DIAGNOSIS — L24A9 Irritant contact dermatitis due friction or contact with other specified body fluids: Secondary | ICD-10-CM | POA: Diagnosis not present

## 2022-02-12 DIAGNOSIS — I69818 Other symptoms and signs involving cognitive functions following other cerebrovascular disease: Secondary | ICD-10-CM | POA: Diagnosis not present

## 2022-02-12 DIAGNOSIS — D649 Anemia, unspecified: Secondary | ICD-10-CM | POA: Diagnosis not present

## 2022-02-12 DIAGNOSIS — J90 Pleural effusion, not elsewhere classified: Secondary | ICD-10-CM | POA: Diagnosis not present

## 2022-02-13 ENCOUNTER — Encounter: Payer: Medicare Other | Admitting: Internal Medicine

## 2022-02-13 DIAGNOSIS — I69818 Other symptoms and signs involving cognitive functions following other cerebrovascular disease: Secondary | ICD-10-CM | POA: Diagnosis not present

## 2022-02-13 DIAGNOSIS — J90 Pleural effusion, not elsewhere classified: Secondary | ICD-10-CM | POA: Diagnosis not present

## 2022-02-13 DIAGNOSIS — D649 Anemia, unspecified: Secondary | ICD-10-CM | POA: Diagnosis not present

## 2022-02-13 DIAGNOSIS — I4819 Other persistent atrial fibrillation: Secondary | ICD-10-CM | POA: Diagnosis not present

## 2022-02-13 DIAGNOSIS — I5030 Unspecified diastolic (congestive) heart failure: Secondary | ICD-10-CM | POA: Diagnosis not present

## 2022-02-13 DIAGNOSIS — I69891 Dysphagia following other cerebrovascular disease: Secondary | ICD-10-CM | POA: Diagnosis not present

## 2022-02-15 DIAGNOSIS — I69818 Other symptoms and signs involving cognitive functions following other cerebrovascular disease: Secondary | ICD-10-CM | POA: Diagnosis not present

## 2022-02-15 DIAGNOSIS — I951 Orthostatic hypotension: Secondary | ICD-10-CM | POA: Diagnosis not present

## 2022-02-15 DIAGNOSIS — D649 Anemia, unspecified: Secondary | ICD-10-CM | POA: Diagnosis not present

## 2022-02-15 DIAGNOSIS — I4819 Other persistent atrial fibrillation: Secondary | ICD-10-CM | POA: Diagnosis not present

## 2022-02-15 DIAGNOSIS — Z7189 Other specified counseling: Secondary | ICD-10-CM | POA: Diagnosis not present

## 2022-02-15 DIAGNOSIS — I5030 Unspecified diastolic (congestive) heart failure: Secondary | ICD-10-CM | POA: Diagnosis not present

## 2022-02-15 DIAGNOSIS — R0600 Dyspnea, unspecified: Secondary | ICD-10-CM | POA: Diagnosis not present

## 2022-02-15 DIAGNOSIS — I69891 Dysphagia following other cerebrovascular disease: Secondary | ICD-10-CM | POA: Diagnosis not present

## 2022-02-15 DIAGNOSIS — F03A0 Unspecified dementia, mild, without behavioral disturbance, psychotic disturbance, mood disturbance, and anxiety: Secondary | ICD-10-CM | POA: Diagnosis not present

## 2022-02-15 DIAGNOSIS — J90 Pleural effusion, not elsewhere classified: Secondary | ICD-10-CM | POA: Diagnosis not present

## 2022-02-15 DIAGNOSIS — G4710 Hypersomnia, unspecified: Secondary | ICD-10-CM | POA: Diagnosis not present

## 2022-02-18 DIAGNOSIS — I69891 Dysphagia following other cerebrovascular disease: Secondary | ICD-10-CM | POA: Diagnosis not present

## 2022-02-18 DIAGNOSIS — I69818 Other symptoms and signs involving cognitive functions following other cerebrovascular disease: Secondary | ICD-10-CM | POA: Diagnosis not present

## 2022-02-18 DIAGNOSIS — I4819 Other persistent atrial fibrillation: Secondary | ICD-10-CM | POA: Diagnosis not present

## 2022-02-18 DIAGNOSIS — J90 Pleural effusion, not elsewhere classified: Secondary | ICD-10-CM | POA: Diagnosis not present

## 2022-02-18 DIAGNOSIS — D649 Anemia, unspecified: Secondary | ICD-10-CM | POA: Diagnosis not present

## 2022-02-18 DIAGNOSIS — I5030 Unspecified diastolic (congestive) heart failure: Secondary | ICD-10-CM | POA: Diagnosis not present

## 2022-02-18 IMAGING — CT CT VIRTUAL COLONOSCOPY SCREENING
3 of 13 series · 10 of 46 positions shown, 16 images · non-contrast
Comparison: CT abdomen/pelvis dated 12/02/2019

CLINICAL DATA: Change in bowel habits, history of colon polyps,
family history of colon cancer. History of thyroid cancer, lung
cancer, non-Hodgkin's lymphoma, and left breast cancer. Prior
cholecystectomy, appendectomy, and hysterectomy. Status post gastric
banding.

EXAM:
CT VIRTUAL COLONOSCOPY SCREENING
TECHNIQUE: The patient was given a standard Mag citrate bowel preparation with
Gastrografin and barium for fluid and stool tagging respectively.
The quality of the bowel preparation is moderate to poor. Automated
CO2 insufflation of the colon was performed prior to image
acquisition and colonic distention is moderate. Image post
processing was used to generate a 3D endoluminal fly-through
projection of the colon and to electronically subtract stool/fluid
as appropriate.

[Series 12: prone colon 1.50 br40 s3 prone thin · axial · 0.87mm/px · z∈[+1168,+1555]mm · 6 of 362 slices shown, 11 images]
[im 52/362  soft-tissue]
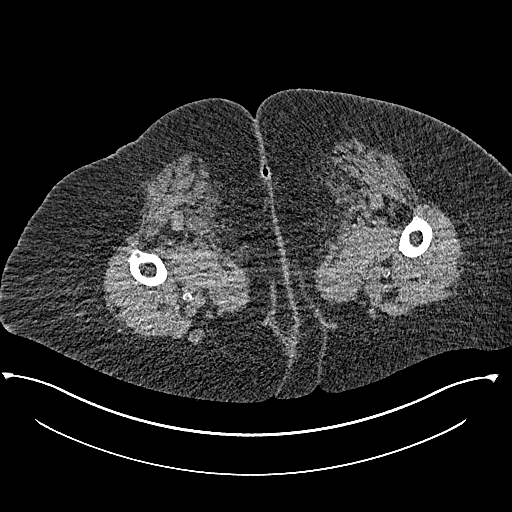
[im 52/362  bone]
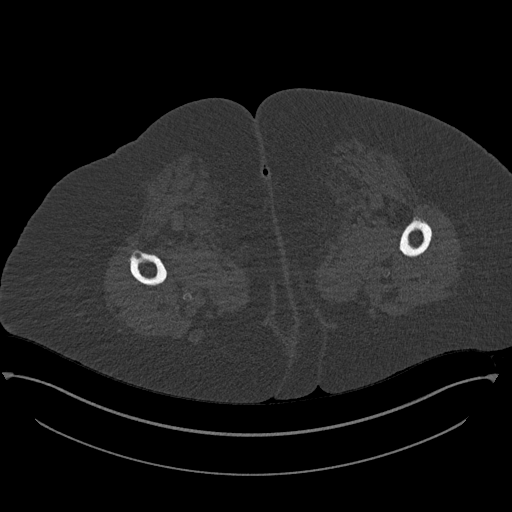
[im 104/362  soft-tissue]
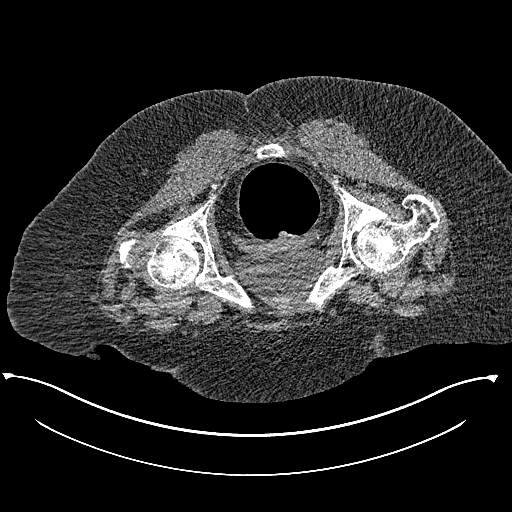
[im 155/362  soft-tissue]
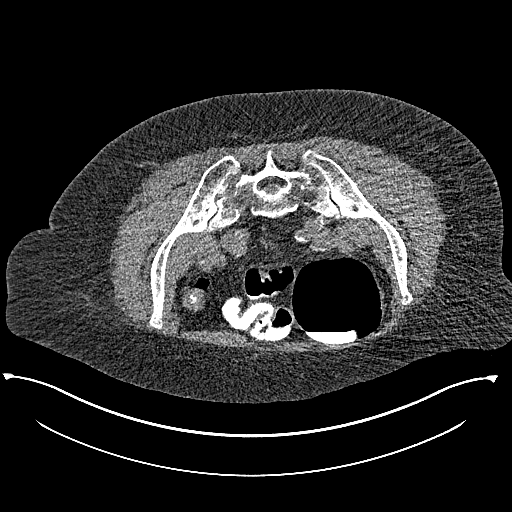
[im 155/362  lung]
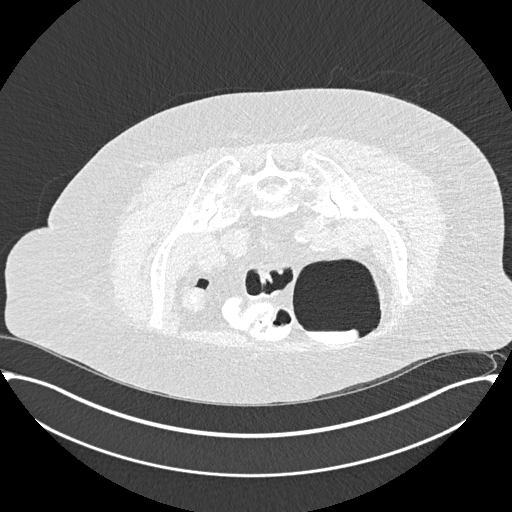
[im 207/362  soft-tissue]
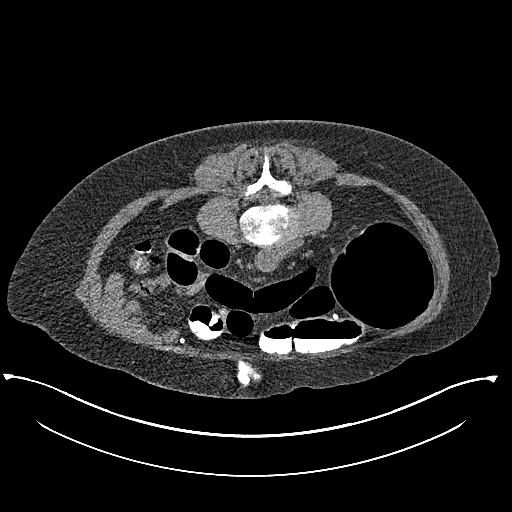
[im 207/362  lung]
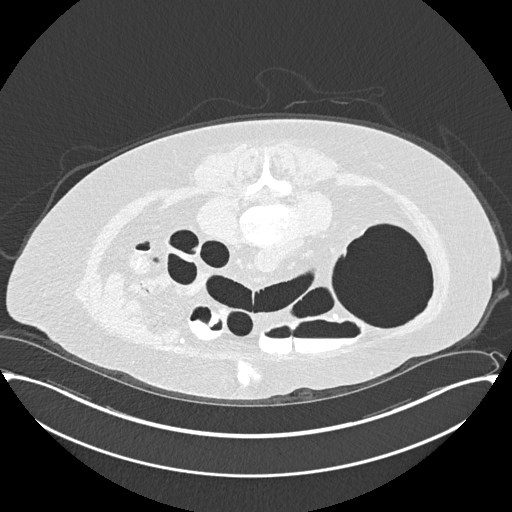
[im 258/362  soft-tissue]
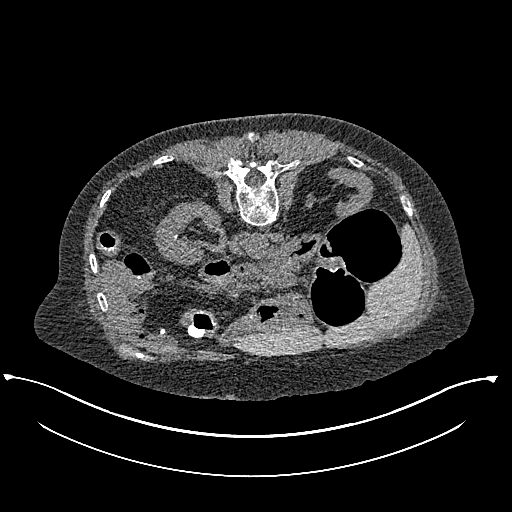
[im 258/362  lung]
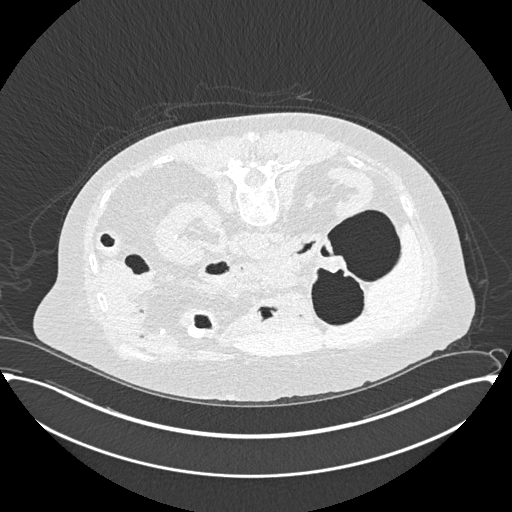
[im 310/362  soft-tissue]
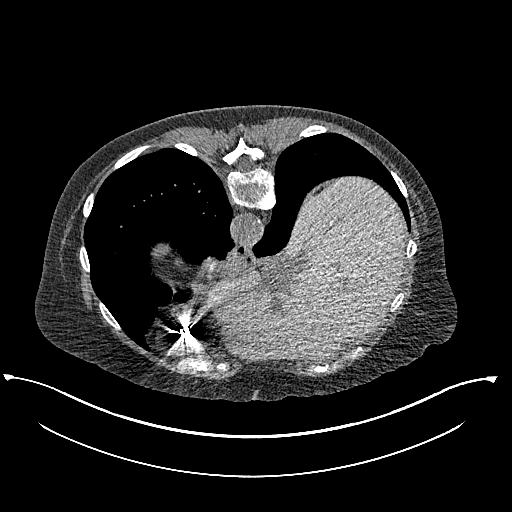
[im 310/362  lung]
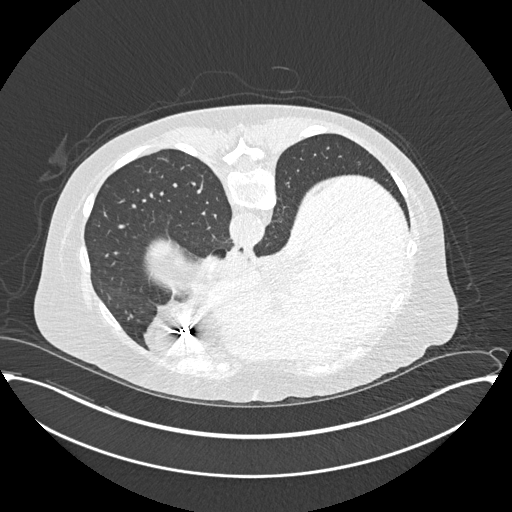

[Series 13: prone colon 3.00 br40 s3 prone 3mm · axial · 0.96mm/px · z∈[+1272,+1452]mm · 2 of 181 slices shown]
[im 61/181  soft-tissue]
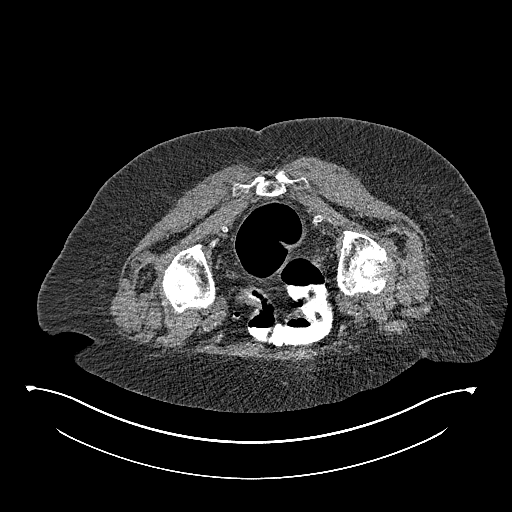
[im 121/181  soft-tissue]
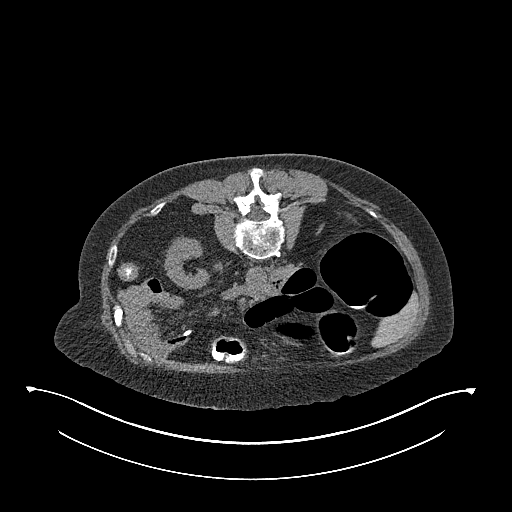

[Series 20: rt decub colon 3.00 br40 s3 cor rt decub · coronal · 0.75mm/px · 2 of 150 slices shown, 3 images]
[im 50/150  soft-tissue]
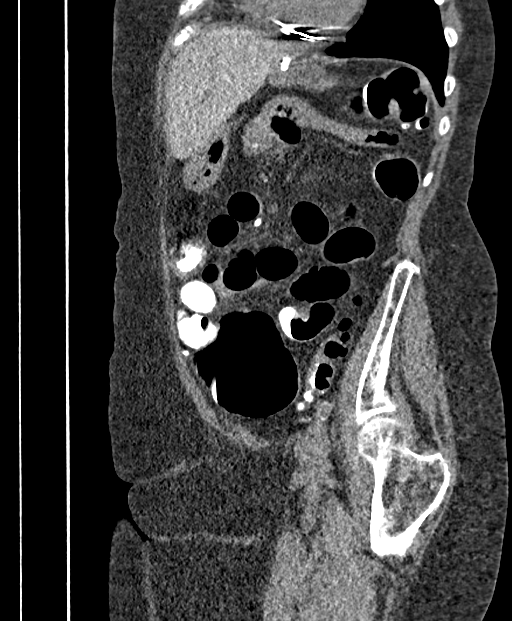
[im 50/150  bone]
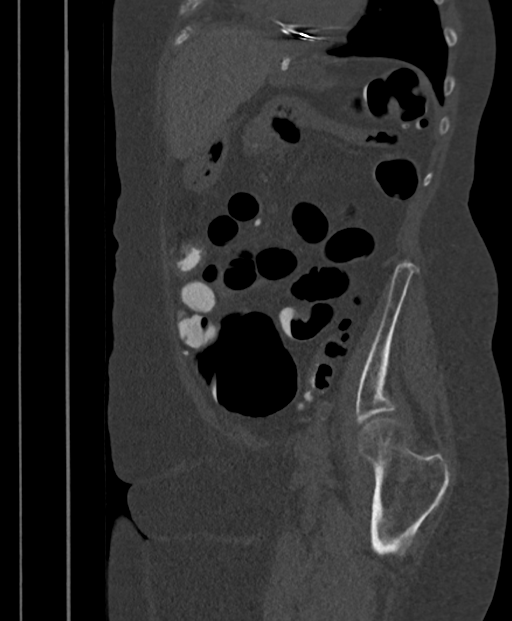
[im 100/150  soft-tissue]
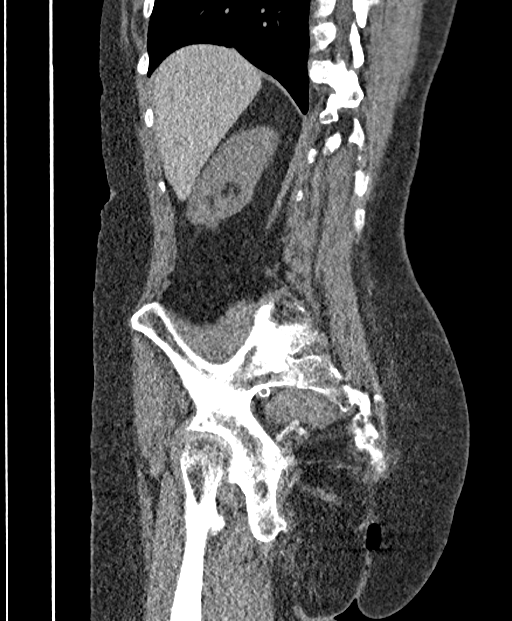

[10 of 46 positions shown; findings below may reference images not displayed]

FINDINGS: VIRTUAL COLONOSCOPY

Layering fluid/contrast in the colon, which shifts positions on
supine/prone imaging, but somewhat constraints evaluation. Left
colon is decompressed on prone imaging. There is layering fluid but
improved distension on supine imaging within this segment of the
colon.

Within those constraints, there are no significant colonic polyps,
masses, apple-core lesions, or strictures.

No evidence of bowel obstruction. Scattered colonic diverticulosis,
without evidence of diverticulitis. Prior appendectomy.

Virtual colonoscopy is not designed to detect diminutive polyps
(i.e., less than or equal to 5 mm), the presence or absence of which
may not affect clinical management.

CT ABDOMEN AND PELVIS WITHOUT CONTRAST

Lower chest: Lung bases are clear.

Hepatobiliary: Subcentimeter cyst in the left hepatic dome (series
6/image 19).

Status post cholecystectomy. No intrahepatic or extrahepatic ductal
dilatation.

Pancreas: Within normal limits

Spleen: Scattered tiny calcified splenic granulomata.

Adrenals/Urinary Tract: Adrenal glands are within normal limits.

Nonobstructing left renal calculi measuring up to 6 mm (series
6/image 54). Right kidney is within normal limits. No
hydronephrosis.

Bladder is underdistended but unremarkable.

Stomach/Bowel: Stomach is notable for a lap band in satisfactory
position with a tiny gastric pouch.

Visualized bowel is described above.

Vascular/Lymphatic: No evidence of abdominal aortic aneurysm.
Atherosclerotic calcifications of the abdominal aorta and branch
vessels.

No suspicious abdominopelvic lymphadenopathy.

Reproductive: Status post hysterectomy.  No adnexal masses.

Other: No abdominopelvic ascites.

Musculoskeletal: Mild degenerative changes of the visualized
thoracolumbar spine.
IMPRESSION: Mildly constrained evaluation due to layering fluid and under
distension of the left colon.

Within that constraint, there are no significant colonic polyps,
masses, apple-core lesions, or strictures.

Sigmoid diverticulosis, without evidence of diverticulitis.

Stable postsurgical and ancillary findings as above.

## 2022-02-19 IMAGING — MG MM DIGITAL DIAGNOSTIC UNILAT*R* W/ TOMO W/ CAD
6 series · 6 of 14 positions shown · non-contrast
Comparison: Previous exam(s).

CLINICAL DATA: Patient presents for a diagnostic right breast exam
to follow-up probable benign microcalcifications.

EXAM:
DIGITAL DIAGNOSTIC UNILATERAL RIGHT MAMMOGRAM WITH TOMO AND CAD

[R CC]
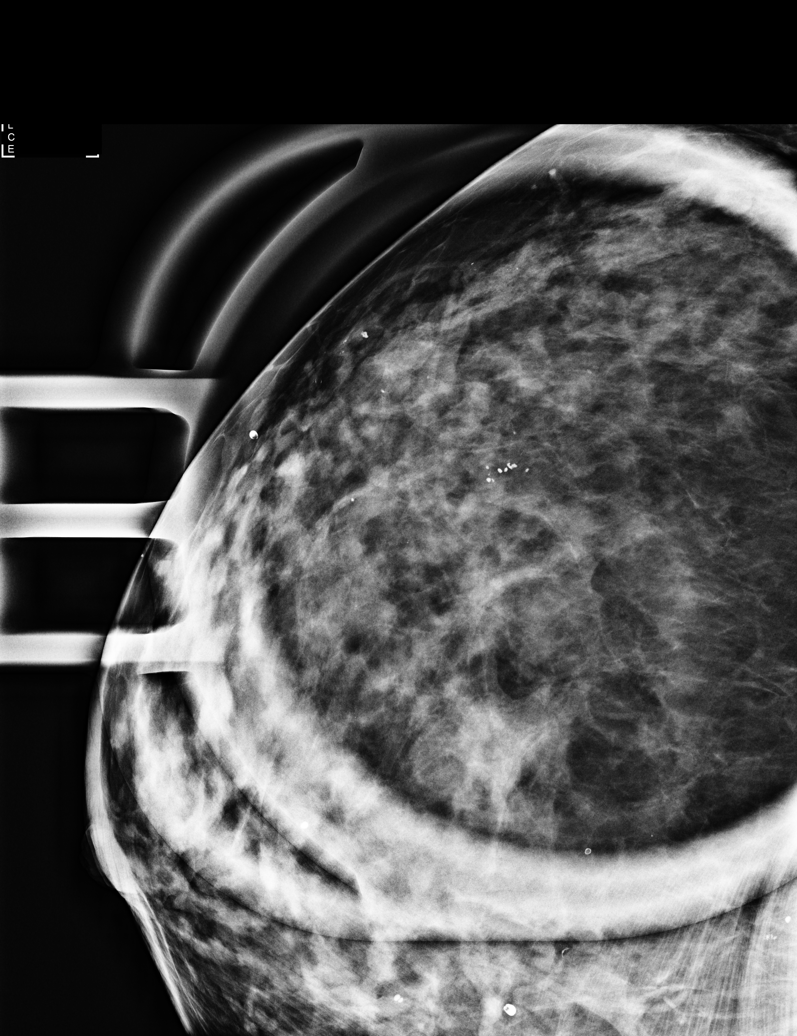

[R ML]
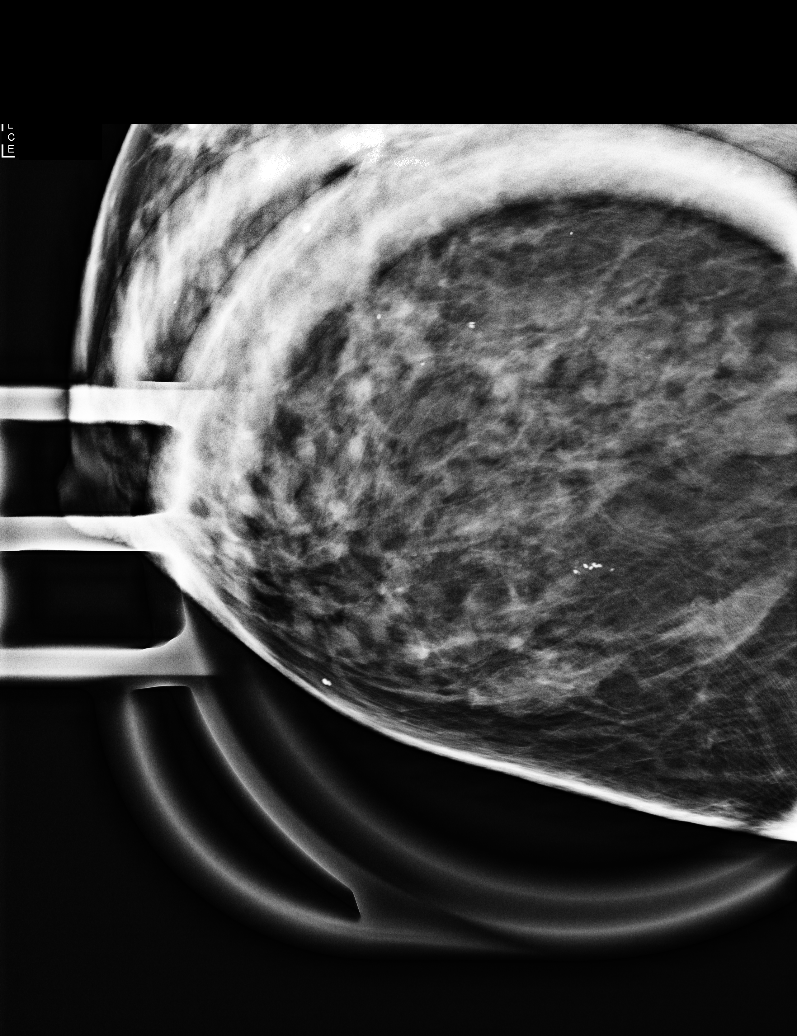

[R MLO synth-2D]
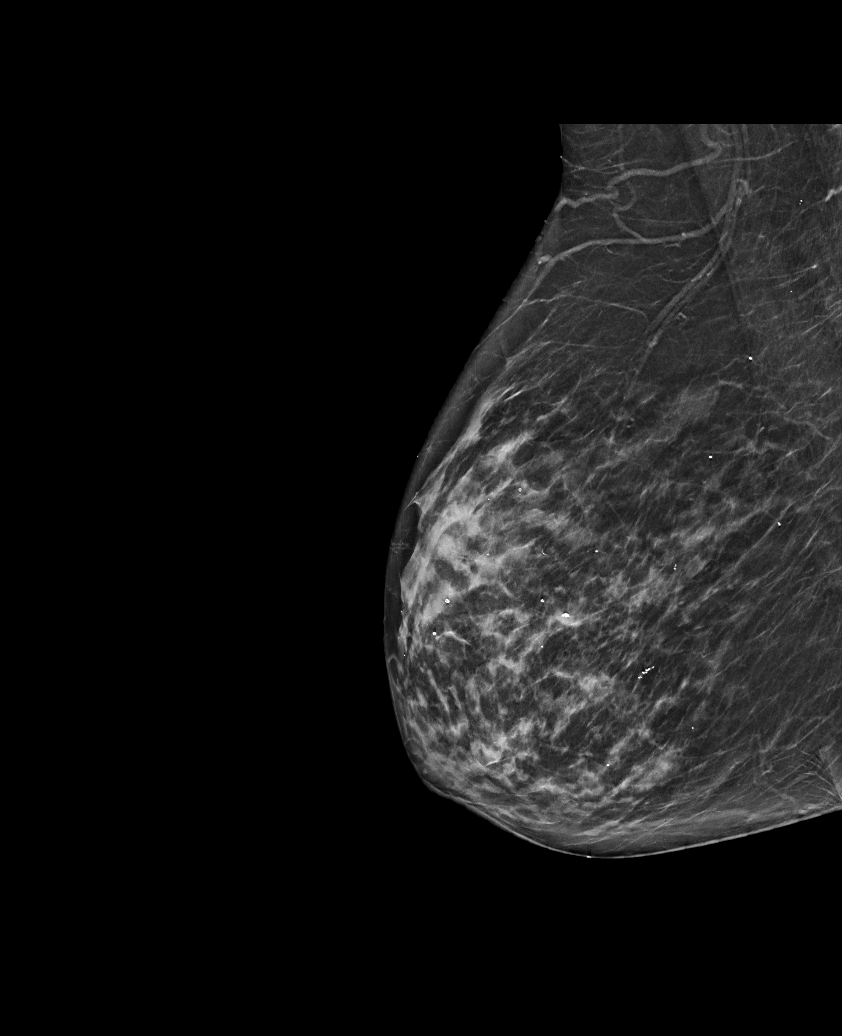

[R CC synth-2D]
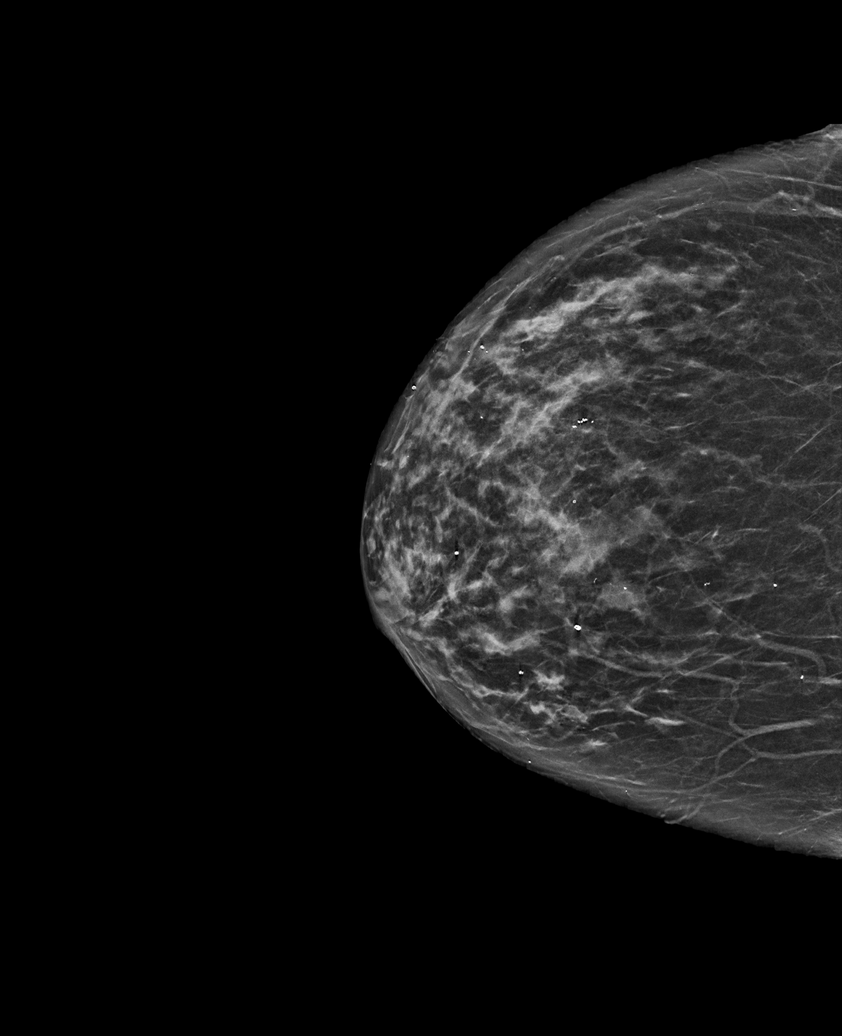

[R CC tomo · tomo slice 27/53.0]
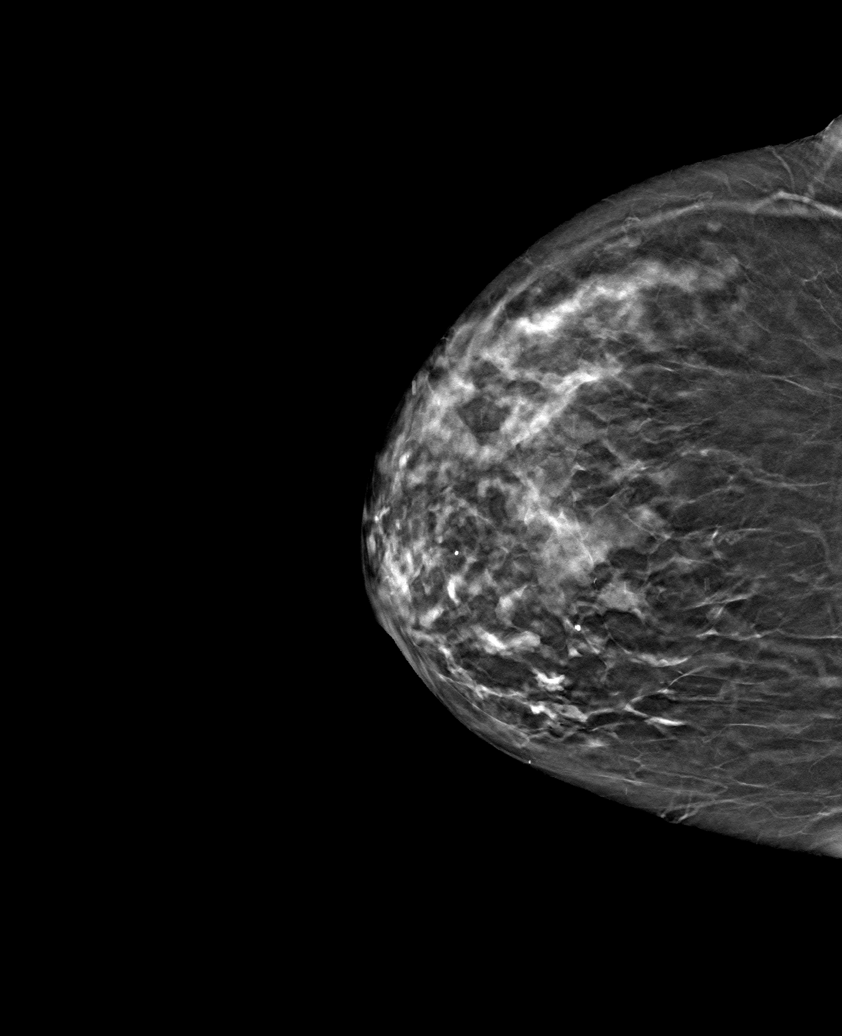

[R MLO tomo · tomo slice 31/61.0]
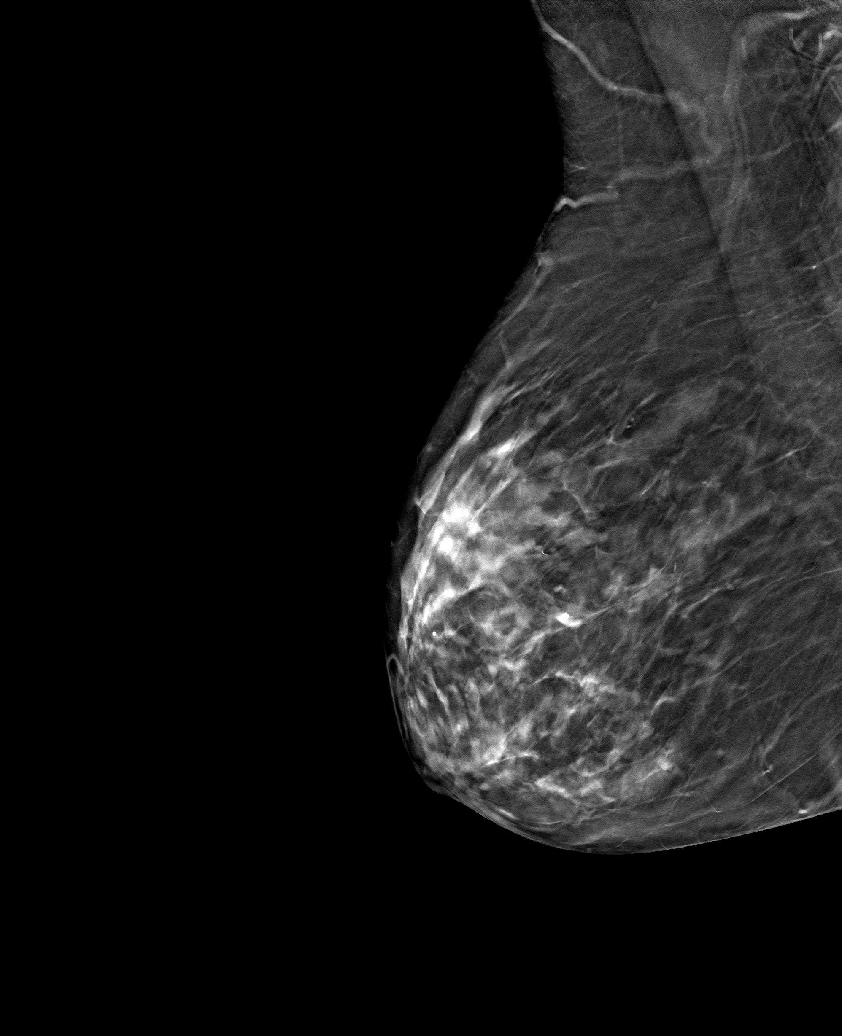

[6 of 14 positions shown; findings below may reference images not displayed]

ACR Breast Density Category c: The breast tissue is heterogeneously
dense, which may obscure small masses.
FINDINGS: Examination demonstrates no change in a 5 mm group of coarse
microcalcifications over the outer lower right breast. Remainder of
the right breast is unchanged.

Mammographic images were processed with CAD.
IMPRESSION: Stable probable benign microcalcifications over the outer lower
right breast.

RECOMMENDATION:
Recommend an additional six-month follow-up diagnostic right breast
mammogram with magnification views at the time of patient's annual
bilateral mammogram in December 2020 to document continued stability.

I have discussed the findings and recommendations with the patient.
If applicable, a reminder letter will be sent to the patient
regarding the next appointment.

BI-RADS CATEGORY  3: Probably benign.

## 2022-02-20 DIAGNOSIS — I69818 Other symptoms and signs involving cognitive functions following other cerebrovascular disease: Secondary | ICD-10-CM | POA: Diagnosis not present

## 2022-02-20 DIAGNOSIS — J90 Pleural effusion, not elsewhere classified: Secondary | ICD-10-CM | POA: Diagnosis not present

## 2022-02-20 DIAGNOSIS — D649 Anemia, unspecified: Secondary | ICD-10-CM | POA: Diagnosis not present

## 2022-02-20 DIAGNOSIS — I5030 Unspecified diastolic (congestive) heart failure: Secondary | ICD-10-CM | POA: Diagnosis not present

## 2022-02-20 DIAGNOSIS — I69891 Dysphagia following other cerebrovascular disease: Secondary | ICD-10-CM | POA: Diagnosis not present

## 2022-02-20 DIAGNOSIS — I4819 Other persistent atrial fibrillation: Secondary | ICD-10-CM | POA: Diagnosis not present

## 2022-02-21 DIAGNOSIS — J690 Pneumonitis due to inhalation of food and vomit: Secondary | ICD-10-CM | POA: Diagnosis not present

## 2022-02-21 DIAGNOSIS — Z8639 Personal history of other endocrine, nutritional and metabolic disease: Secondary | ICD-10-CM | POA: Diagnosis not present

## 2022-02-21 DIAGNOSIS — Z853 Personal history of malignant neoplasm of breast: Secondary | ICD-10-CM | POA: Diagnosis not present

## 2022-02-21 DIAGNOSIS — K760 Fatty (change of) liver, not elsewhere classified: Secondary | ICD-10-CM | POA: Diagnosis not present

## 2022-02-21 DIAGNOSIS — K635 Polyp of colon: Secondary | ICD-10-CM | POA: Diagnosis not present

## 2022-02-21 DIAGNOSIS — Z85118 Personal history of other malignant neoplasm of bronchus and lung: Secondary | ICD-10-CM | POA: Diagnosis not present

## 2022-02-21 DIAGNOSIS — I5030 Unspecified diastolic (congestive) heart failure: Secondary | ICD-10-CM | POA: Diagnosis not present

## 2022-02-21 DIAGNOSIS — I69818 Other symptoms and signs involving cognitive functions following other cerebrovascular disease: Secondary | ICD-10-CM | POA: Diagnosis not present

## 2022-02-21 DIAGNOSIS — Z9089 Acquired absence of other organs: Secondary | ICD-10-CM | POA: Diagnosis not present

## 2022-02-21 DIAGNOSIS — F32A Depression, unspecified: Secondary | ICD-10-CM | POA: Diagnosis not present

## 2022-02-21 DIAGNOSIS — J90 Pleural effusion, not elsewhere classified: Secondary | ICD-10-CM | POA: Diagnosis not present

## 2022-02-21 DIAGNOSIS — I4819 Other persistent atrial fibrillation: Secondary | ICD-10-CM | POA: Diagnosis not present

## 2022-02-21 DIAGNOSIS — N809 Endometriosis, unspecified: Secondary | ICD-10-CM | POA: Diagnosis not present

## 2022-02-21 DIAGNOSIS — M1990 Unspecified osteoarthritis, unspecified site: Secondary | ICD-10-CM | POA: Diagnosis not present

## 2022-02-21 DIAGNOSIS — Z8572 Personal history of non-Hodgkin lymphomas: Secondary | ICD-10-CM | POA: Diagnosis not present

## 2022-02-21 DIAGNOSIS — Z90710 Acquired absence of both cervix and uterus: Secondary | ICD-10-CM | POA: Diagnosis not present

## 2022-02-21 DIAGNOSIS — Z6824 Body mass index (BMI) 24.0-24.9, adult: Secondary | ICD-10-CM | POA: Diagnosis not present

## 2022-02-21 DIAGNOSIS — N200 Calculus of kidney: Secondary | ICD-10-CM | POA: Diagnosis not present

## 2022-02-21 DIAGNOSIS — Z9884 Bariatric surgery status: Secondary | ICD-10-CM | POA: Diagnosis not present

## 2022-02-21 DIAGNOSIS — Z8673 Personal history of transient ischemic attack (TIA), and cerebral infarction without residual deficits: Secondary | ICD-10-CM | POA: Diagnosis not present

## 2022-02-21 DIAGNOSIS — Z85850 Personal history of malignant neoplasm of thyroid: Secondary | ICD-10-CM | POA: Diagnosis not present

## 2022-02-21 DIAGNOSIS — D649 Anemia, unspecified: Secondary | ICD-10-CM | POA: Diagnosis not present

## 2022-02-21 DIAGNOSIS — E890 Postprocedural hypothyroidism: Secondary | ICD-10-CM | POA: Diagnosis not present

## 2022-02-21 DIAGNOSIS — I69891 Dysphagia following other cerebrovascular disease: Secondary | ICD-10-CM | POA: Diagnosis not present

## 2022-02-22 DIAGNOSIS — J90 Pleural effusion, not elsewhere classified: Secondary | ICD-10-CM | POA: Diagnosis not present

## 2022-02-22 DIAGNOSIS — I4819 Other persistent atrial fibrillation: Secondary | ICD-10-CM | POA: Diagnosis not present

## 2022-02-22 DIAGNOSIS — I69891 Dysphagia following other cerebrovascular disease: Secondary | ICD-10-CM | POA: Diagnosis not present

## 2022-02-22 DIAGNOSIS — D649 Anemia, unspecified: Secondary | ICD-10-CM | POA: Diagnosis not present

## 2022-02-22 DIAGNOSIS — I5030 Unspecified diastolic (congestive) heart failure: Secondary | ICD-10-CM | POA: Diagnosis not present

## 2022-02-22 DIAGNOSIS — I69818 Other symptoms and signs involving cognitive functions following other cerebrovascular disease: Secondary | ICD-10-CM | POA: Diagnosis not present

## 2022-02-26 DIAGNOSIS — L89156 Pressure-induced deep tissue damage of sacral region: Secondary | ICD-10-CM | POA: Diagnosis not present

## 2022-02-26 DIAGNOSIS — J90 Pleural effusion, not elsewhere classified: Secondary | ICD-10-CM | POA: Diagnosis not present

## 2022-02-26 DIAGNOSIS — I4819 Other persistent atrial fibrillation: Secondary | ICD-10-CM | POA: Diagnosis not present

## 2022-02-26 DIAGNOSIS — D649 Anemia, unspecified: Secondary | ICD-10-CM | POA: Diagnosis not present

## 2022-02-26 DIAGNOSIS — I5030 Unspecified diastolic (congestive) heart failure: Secondary | ICD-10-CM | POA: Diagnosis not present

## 2022-02-26 DIAGNOSIS — I69891 Dysphagia following other cerebrovascular disease: Secondary | ICD-10-CM | POA: Diagnosis not present

## 2022-02-26 DIAGNOSIS — I69818 Other symptoms and signs involving cognitive functions following other cerebrovascular disease: Secondary | ICD-10-CM | POA: Diagnosis not present

## 2022-02-27 DIAGNOSIS — D649 Anemia, unspecified: Secondary | ICD-10-CM | POA: Diagnosis not present

## 2022-02-27 DIAGNOSIS — J90 Pleural effusion, not elsewhere classified: Secondary | ICD-10-CM | POA: Diagnosis not present

## 2022-02-27 DIAGNOSIS — I4819 Other persistent atrial fibrillation: Secondary | ICD-10-CM | POA: Diagnosis not present

## 2022-02-27 DIAGNOSIS — I69891 Dysphagia following other cerebrovascular disease: Secondary | ICD-10-CM | POA: Diagnosis not present

## 2022-02-27 DIAGNOSIS — I5030 Unspecified diastolic (congestive) heart failure: Secondary | ICD-10-CM | POA: Diagnosis not present

## 2022-02-27 DIAGNOSIS — I69818 Other symptoms and signs involving cognitive functions following other cerebrovascular disease: Secondary | ICD-10-CM | POA: Diagnosis not present

## 2022-03-01 DIAGNOSIS — I4819 Other persistent atrial fibrillation: Secondary | ICD-10-CM | POA: Diagnosis not present

## 2022-03-01 DIAGNOSIS — D649 Anemia, unspecified: Secondary | ICD-10-CM | POA: Diagnosis not present

## 2022-03-01 DIAGNOSIS — I69891 Dysphagia following other cerebrovascular disease: Secondary | ICD-10-CM | POA: Diagnosis not present

## 2022-03-01 DIAGNOSIS — I5030 Unspecified diastolic (congestive) heart failure: Secondary | ICD-10-CM | POA: Diagnosis not present

## 2022-03-01 DIAGNOSIS — J90 Pleural effusion, not elsewhere classified: Secondary | ICD-10-CM | POA: Diagnosis not present

## 2022-03-01 DIAGNOSIS — I69818 Other symptoms and signs involving cognitive functions following other cerebrovascular disease: Secondary | ICD-10-CM | POA: Diagnosis not present

## 2022-03-05 DIAGNOSIS — J9611 Chronic respiratory failure with hypoxia: Secondary | ICD-10-CM | POA: Diagnosis not present

## 2022-03-05 DIAGNOSIS — I5032 Chronic diastolic (congestive) heart failure: Secondary | ICD-10-CM | POA: Diagnosis not present

## 2022-03-05 DIAGNOSIS — R627 Adult failure to thrive: Secondary | ICD-10-CM | POA: Diagnosis not present

## 2022-03-05 DIAGNOSIS — Z515 Encounter for palliative care: Secondary | ICD-10-CM | POA: Diagnosis not present

## 2022-03-05 DIAGNOSIS — H8190 Unspecified disorder of vestibular function, unspecified ear: Secondary | ICD-10-CM | POA: Diagnosis not present

## 2022-03-05 DIAGNOSIS — I4891 Unspecified atrial fibrillation: Secondary | ICD-10-CM | POA: Diagnosis not present

## 2022-03-05 DIAGNOSIS — J90 Pleural effusion, not elsewhere classified: Secondary | ICD-10-CM | POA: Diagnosis not present

## 2022-03-05 DIAGNOSIS — L89156 Pressure-induced deep tissue damage of sacral region: Secondary | ICD-10-CM | POA: Diagnosis not present

## 2022-03-05 DIAGNOSIS — E039 Hypothyroidism, unspecified: Secondary | ICD-10-CM | POA: Diagnosis not present

## 2022-03-05 DIAGNOSIS — F03A0 Unspecified dementia, mild, without behavioral disturbance, psychotic disturbance, mood disturbance, and anxiety: Secondary | ICD-10-CM | POA: Diagnosis not present

## 2022-03-06 DIAGNOSIS — I69891 Dysphagia following other cerebrovascular disease: Secondary | ICD-10-CM | POA: Diagnosis not present

## 2022-03-06 DIAGNOSIS — I69818 Other symptoms and signs involving cognitive functions following other cerebrovascular disease: Secondary | ICD-10-CM | POA: Diagnosis not present

## 2022-03-06 DIAGNOSIS — D649 Anemia, unspecified: Secondary | ICD-10-CM | POA: Diagnosis not present

## 2022-03-06 DIAGNOSIS — J90 Pleural effusion, not elsewhere classified: Secondary | ICD-10-CM | POA: Diagnosis not present

## 2022-03-06 DIAGNOSIS — I4819 Other persistent atrial fibrillation: Secondary | ICD-10-CM | POA: Diagnosis not present

## 2022-03-06 DIAGNOSIS — I5030 Unspecified diastolic (congestive) heart failure: Secondary | ICD-10-CM | POA: Diagnosis not present

## 2022-03-08 DIAGNOSIS — I5030 Unspecified diastolic (congestive) heart failure: Secondary | ICD-10-CM | POA: Diagnosis not present

## 2022-03-08 DIAGNOSIS — I69818 Other symptoms and signs involving cognitive functions following other cerebrovascular disease: Secondary | ICD-10-CM | POA: Diagnosis not present

## 2022-03-08 DIAGNOSIS — J90 Pleural effusion, not elsewhere classified: Secondary | ICD-10-CM | POA: Diagnosis not present

## 2022-03-08 DIAGNOSIS — I69891 Dysphagia following other cerebrovascular disease: Secondary | ICD-10-CM | POA: Diagnosis not present

## 2022-03-08 DIAGNOSIS — I4819 Other persistent atrial fibrillation: Secondary | ICD-10-CM | POA: Diagnosis not present

## 2022-03-08 DIAGNOSIS — D649 Anemia, unspecified: Secondary | ICD-10-CM | POA: Diagnosis not present

## 2022-03-12 DIAGNOSIS — D649 Anemia, unspecified: Secondary | ICD-10-CM | POA: Diagnosis not present

## 2022-03-12 DIAGNOSIS — L89890 Pressure ulcer of other site, unstageable: Secondary | ICD-10-CM | POA: Diagnosis not present

## 2022-03-12 DIAGNOSIS — I69891 Dysphagia following other cerebrovascular disease: Secondary | ICD-10-CM | POA: Diagnosis not present

## 2022-03-12 DIAGNOSIS — I5030 Unspecified diastolic (congestive) heart failure: Secondary | ICD-10-CM | POA: Diagnosis not present

## 2022-03-12 DIAGNOSIS — L89896 Pressure-induced deep tissue damage of other site: Secondary | ICD-10-CM | POA: Diagnosis not present

## 2022-03-12 DIAGNOSIS — L89156 Pressure-induced deep tissue damage of sacral region: Secondary | ICD-10-CM | POA: Diagnosis not present

## 2022-03-12 DIAGNOSIS — I69818 Other symptoms and signs involving cognitive functions following other cerebrovascular disease: Secondary | ICD-10-CM | POA: Diagnosis not present

## 2022-03-12 DIAGNOSIS — J90 Pleural effusion, not elsewhere classified: Secondary | ICD-10-CM | POA: Diagnosis not present

## 2022-03-12 DIAGNOSIS — I4819 Other persistent atrial fibrillation: Secondary | ICD-10-CM | POA: Diagnosis not present

## 2022-03-13 DIAGNOSIS — I4819 Other persistent atrial fibrillation: Secondary | ICD-10-CM | POA: Diagnosis not present

## 2022-03-13 DIAGNOSIS — D649 Anemia, unspecified: Secondary | ICD-10-CM | POA: Diagnosis not present

## 2022-03-13 DIAGNOSIS — J90 Pleural effusion, not elsewhere classified: Secondary | ICD-10-CM | POA: Diagnosis not present

## 2022-03-13 DIAGNOSIS — I69818 Other symptoms and signs involving cognitive functions following other cerebrovascular disease: Secondary | ICD-10-CM | POA: Diagnosis not present

## 2022-03-13 DIAGNOSIS — I69891 Dysphagia following other cerebrovascular disease: Secondary | ICD-10-CM | POA: Diagnosis not present

## 2022-03-13 DIAGNOSIS — I5030 Unspecified diastolic (congestive) heart failure: Secondary | ICD-10-CM | POA: Diagnosis not present

## 2022-03-15 DIAGNOSIS — I5030 Unspecified diastolic (congestive) heart failure: Secondary | ICD-10-CM | POA: Diagnosis not present

## 2022-03-15 DIAGNOSIS — F32A Depression, unspecified: Secondary | ICD-10-CM | POA: Diagnosis not present

## 2022-03-15 DIAGNOSIS — L304 Erythema intertrigo: Secondary | ICD-10-CM | POA: Diagnosis not present

## 2022-03-15 DIAGNOSIS — Z515 Encounter for palliative care: Secondary | ICD-10-CM | POA: Diagnosis not present

## 2022-03-15 DIAGNOSIS — D649 Anemia, unspecified: Secondary | ICD-10-CM | POA: Diagnosis not present

## 2022-03-15 DIAGNOSIS — I4819 Other persistent atrial fibrillation: Secondary | ICD-10-CM | POA: Diagnosis not present

## 2022-03-15 DIAGNOSIS — J90 Pleural effusion, not elsewhere classified: Secondary | ICD-10-CM | POA: Diagnosis not present

## 2022-03-15 DIAGNOSIS — I69818 Other symptoms and signs involving cognitive functions following other cerebrovascular disease: Secondary | ICD-10-CM | POA: Diagnosis not present

## 2022-03-15 DIAGNOSIS — I69891 Dysphagia following other cerebrovascular disease: Secondary | ICD-10-CM | POA: Diagnosis not present

## 2022-03-15 DIAGNOSIS — Z029 Encounter for administrative examinations, unspecified: Secondary | ICD-10-CM | POA: Diagnosis not present

## 2022-03-19 DIAGNOSIS — L89150 Pressure ulcer of sacral region, unstageable: Secondary | ICD-10-CM | POA: Diagnosis not present

## 2022-03-19 DIAGNOSIS — L89890 Pressure ulcer of other site, unstageable: Secondary | ICD-10-CM | POA: Diagnosis not present

## 2022-03-19 DIAGNOSIS — I69818 Other symptoms and signs involving cognitive functions following other cerebrovascular disease: Secondary | ICD-10-CM | POA: Diagnosis not present

## 2022-03-19 DIAGNOSIS — J90 Pleural effusion, not elsewhere classified: Secondary | ICD-10-CM | POA: Diagnosis not present

## 2022-03-19 DIAGNOSIS — I5030 Unspecified diastolic (congestive) heart failure: Secondary | ICD-10-CM | POA: Diagnosis not present

## 2022-03-19 DIAGNOSIS — I69891 Dysphagia following other cerebrovascular disease: Secondary | ICD-10-CM | POA: Diagnosis not present

## 2022-03-19 DIAGNOSIS — L89896 Pressure-induced deep tissue damage of other site: Secondary | ICD-10-CM | POA: Diagnosis not present

## 2022-03-19 DIAGNOSIS — D649 Anemia, unspecified: Secondary | ICD-10-CM | POA: Diagnosis not present

## 2022-03-19 DIAGNOSIS — I4819 Other persistent atrial fibrillation: Secondary | ICD-10-CM | POA: Diagnosis not present

## 2022-03-22 DIAGNOSIS — I69891 Dysphagia following other cerebrovascular disease: Secondary | ICD-10-CM | POA: Diagnosis not present

## 2022-03-22 DIAGNOSIS — I4819 Other persistent atrial fibrillation: Secondary | ICD-10-CM | POA: Diagnosis not present

## 2022-03-22 DIAGNOSIS — I5030 Unspecified diastolic (congestive) heart failure: Secondary | ICD-10-CM | POA: Diagnosis not present

## 2022-03-22 DIAGNOSIS — J90 Pleural effusion, not elsewhere classified: Secondary | ICD-10-CM | POA: Diagnosis not present

## 2022-03-22 DIAGNOSIS — D649 Anemia, unspecified: Secondary | ICD-10-CM | POA: Diagnosis not present

## 2022-03-22 DIAGNOSIS — I69818 Other symptoms and signs involving cognitive functions following other cerebrovascular disease: Secondary | ICD-10-CM | POA: Diagnosis not present

## 2022-03-23 DIAGNOSIS — Z853 Personal history of malignant neoplasm of breast: Secondary | ICD-10-CM | POA: Diagnosis not present

## 2022-03-23 DIAGNOSIS — I69891 Dysphagia following other cerebrovascular disease: Secondary | ICD-10-CM | POA: Diagnosis not present

## 2022-03-23 DIAGNOSIS — Z85118 Personal history of other malignant neoplasm of bronchus and lung: Secondary | ICD-10-CM | POA: Diagnosis not present

## 2022-03-23 DIAGNOSIS — Z8639 Personal history of other endocrine, nutritional and metabolic disease: Secondary | ICD-10-CM | POA: Diagnosis not present

## 2022-03-23 DIAGNOSIS — N809 Endometriosis, unspecified: Secondary | ICD-10-CM | POA: Diagnosis not present

## 2022-03-23 DIAGNOSIS — J690 Pneumonitis due to inhalation of food and vomit: Secondary | ICD-10-CM | POA: Diagnosis not present

## 2022-03-23 DIAGNOSIS — E890 Postprocedural hypothyroidism: Secondary | ICD-10-CM | POA: Diagnosis not present

## 2022-03-23 DIAGNOSIS — J90 Pleural effusion, not elsewhere classified: Secondary | ICD-10-CM | POA: Diagnosis not present

## 2022-03-23 DIAGNOSIS — K635 Polyp of colon: Secondary | ICD-10-CM | POA: Diagnosis not present

## 2022-03-23 DIAGNOSIS — Z6824 Body mass index (BMI) 24.0-24.9, adult: Secondary | ICD-10-CM | POA: Diagnosis not present

## 2022-03-23 DIAGNOSIS — I4819 Other persistent atrial fibrillation: Secondary | ICD-10-CM | POA: Diagnosis not present

## 2022-03-23 DIAGNOSIS — M1990 Unspecified osteoarthritis, unspecified site: Secondary | ICD-10-CM | POA: Diagnosis not present

## 2022-03-23 DIAGNOSIS — N200 Calculus of kidney: Secondary | ICD-10-CM | POA: Diagnosis not present

## 2022-03-23 DIAGNOSIS — Z8673 Personal history of transient ischemic attack (TIA), and cerebral infarction without residual deficits: Secondary | ICD-10-CM | POA: Diagnosis not present

## 2022-03-23 DIAGNOSIS — Z85850 Personal history of malignant neoplasm of thyroid: Secondary | ICD-10-CM | POA: Diagnosis not present

## 2022-03-23 DIAGNOSIS — Z8572 Personal history of non-Hodgkin lymphomas: Secondary | ICD-10-CM | POA: Diagnosis not present

## 2022-03-23 DIAGNOSIS — F32A Depression, unspecified: Secondary | ICD-10-CM | POA: Diagnosis not present

## 2022-03-23 DIAGNOSIS — I69818 Other symptoms and signs involving cognitive functions following other cerebrovascular disease: Secondary | ICD-10-CM | POA: Diagnosis not present

## 2022-03-23 DIAGNOSIS — I5030 Unspecified diastolic (congestive) heart failure: Secondary | ICD-10-CM | POA: Diagnosis not present

## 2022-03-23 DIAGNOSIS — Z9089 Acquired absence of other organs: Secondary | ICD-10-CM | POA: Diagnosis not present

## 2022-03-23 DIAGNOSIS — K760 Fatty (change of) liver, not elsewhere classified: Secondary | ICD-10-CM | POA: Diagnosis not present

## 2022-03-23 DIAGNOSIS — D649 Anemia, unspecified: Secondary | ICD-10-CM | POA: Diagnosis not present

## 2022-03-23 DIAGNOSIS — Z90710 Acquired absence of both cervix and uterus: Secondary | ICD-10-CM | POA: Diagnosis not present

## 2022-03-23 DIAGNOSIS — Z9884 Bariatric surgery status: Secondary | ICD-10-CM | POA: Diagnosis not present

## 2022-03-26 DIAGNOSIS — D649 Anemia, unspecified: Secondary | ICD-10-CM | POA: Diagnosis not present

## 2022-03-26 DIAGNOSIS — I4819 Other persistent atrial fibrillation: Secondary | ICD-10-CM | POA: Diagnosis not present

## 2022-03-26 DIAGNOSIS — I5030 Unspecified diastolic (congestive) heart failure: Secondary | ICD-10-CM | POA: Diagnosis not present

## 2022-03-26 DIAGNOSIS — J90 Pleural effusion, not elsewhere classified: Secondary | ICD-10-CM | POA: Diagnosis not present

## 2022-03-26 DIAGNOSIS — I69891 Dysphagia following other cerebrovascular disease: Secondary | ICD-10-CM | POA: Diagnosis not present

## 2022-03-26 DIAGNOSIS — I69818 Other symptoms and signs involving cognitive functions following other cerebrovascular disease: Secondary | ICD-10-CM | POA: Diagnosis not present

## 2022-03-27 DIAGNOSIS — I69818 Other symptoms and signs involving cognitive functions following other cerebrovascular disease: Secondary | ICD-10-CM | POA: Diagnosis not present

## 2022-03-27 DIAGNOSIS — I69891 Dysphagia following other cerebrovascular disease: Secondary | ICD-10-CM | POA: Diagnosis not present

## 2022-03-27 DIAGNOSIS — I4819 Other persistent atrial fibrillation: Secondary | ICD-10-CM | POA: Diagnosis not present

## 2022-03-27 DIAGNOSIS — D649 Anemia, unspecified: Secondary | ICD-10-CM | POA: Diagnosis not present

## 2022-03-27 DIAGNOSIS — J90 Pleural effusion, not elsewhere classified: Secondary | ICD-10-CM | POA: Diagnosis not present

## 2022-03-27 DIAGNOSIS — I5030 Unspecified diastolic (congestive) heart failure: Secondary | ICD-10-CM | POA: Diagnosis not present

## 2022-03-29 DIAGNOSIS — I4819 Other persistent atrial fibrillation: Secondary | ICD-10-CM | POA: Diagnosis not present

## 2022-03-29 DIAGNOSIS — I5030 Unspecified diastolic (congestive) heart failure: Secondary | ICD-10-CM | POA: Diagnosis not present

## 2022-03-29 DIAGNOSIS — J90 Pleural effusion, not elsewhere classified: Secondary | ICD-10-CM | POA: Diagnosis not present

## 2022-03-29 DIAGNOSIS — I69818 Other symptoms and signs involving cognitive functions following other cerebrovascular disease: Secondary | ICD-10-CM | POA: Diagnosis not present

## 2022-03-29 DIAGNOSIS — D649 Anemia, unspecified: Secondary | ICD-10-CM | POA: Diagnosis not present

## 2022-03-29 DIAGNOSIS — I69891 Dysphagia following other cerebrovascular disease: Secondary | ICD-10-CM | POA: Diagnosis not present

## 2022-04-01 DIAGNOSIS — I4819 Other persistent atrial fibrillation: Secondary | ICD-10-CM | POA: Diagnosis not present

## 2022-04-01 DIAGNOSIS — I5030 Unspecified diastolic (congestive) heart failure: Secondary | ICD-10-CM | POA: Diagnosis not present

## 2022-04-01 DIAGNOSIS — I69891 Dysphagia following other cerebrovascular disease: Secondary | ICD-10-CM | POA: Diagnosis not present

## 2022-04-01 DIAGNOSIS — D649 Anemia, unspecified: Secondary | ICD-10-CM | POA: Diagnosis not present

## 2022-04-01 DIAGNOSIS — F4323 Adjustment disorder with mixed anxiety and depressed mood: Secondary | ICD-10-CM | POA: Diagnosis not present

## 2022-04-01 DIAGNOSIS — I69818 Other symptoms and signs involving cognitive functions following other cerebrovascular disease: Secondary | ICD-10-CM | POA: Diagnosis not present

## 2022-04-01 DIAGNOSIS — J90 Pleural effusion, not elsewhere classified: Secondary | ICD-10-CM | POA: Diagnosis not present

## 2022-04-02 DIAGNOSIS — L89896 Pressure-induced deep tissue damage of other site: Secondary | ICD-10-CM | POA: Diagnosis not present

## 2022-04-02 DIAGNOSIS — L89890 Pressure ulcer of other site, unstageable: Secondary | ICD-10-CM | POA: Diagnosis not present

## 2022-04-02 DIAGNOSIS — L89150 Pressure ulcer of sacral region, unstageable: Secondary | ICD-10-CM | POA: Diagnosis not present

## 2022-04-03 DIAGNOSIS — J90 Pleural effusion, not elsewhere classified: Secondary | ICD-10-CM | POA: Diagnosis not present

## 2022-04-03 DIAGNOSIS — D649 Anemia, unspecified: Secondary | ICD-10-CM | POA: Diagnosis not present

## 2022-04-03 DIAGNOSIS — I69891 Dysphagia following other cerebrovascular disease: Secondary | ICD-10-CM | POA: Diagnosis not present

## 2022-04-03 DIAGNOSIS — I5030 Unspecified diastolic (congestive) heart failure: Secondary | ICD-10-CM | POA: Diagnosis not present

## 2022-04-03 DIAGNOSIS — I69818 Other symptoms and signs involving cognitive functions following other cerebrovascular disease: Secondary | ICD-10-CM | POA: Diagnosis not present

## 2022-04-03 DIAGNOSIS — I4819 Other persistent atrial fibrillation: Secondary | ICD-10-CM | POA: Diagnosis not present

## 2022-04-05 DIAGNOSIS — D649 Anemia, unspecified: Secondary | ICD-10-CM | POA: Diagnosis not present

## 2022-04-05 DIAGNOSIS — I5030 Unspecified diastolic (congestive) heart failure: Secondary | ICD-10-CM | POA: Diagnosis not present

## 2022-04-05 DIAGNOSIS — I69818 Other symptoms and signs involving cognitive functions following other cerebrovascular disease: Secondary | ICD-10-CM | POA: Diagnosis not present

## 2022-04-05 DIAGNOSIS — I69891 Dysphagia following other cerebrovascular disease: Secondary | ICD-10-CM | POA: Diagnosis not present

## 2022-04-05 DIAGNOSIS — J90 Pleural effusion, not elsewhere classified: Secondary | ICD-10-CM | POA: Diagnosis not present

## 2022-04-05 DIAGNOSIS — I4819 Other persistent atrial fibrillation: Secondary | ICD-10-CM | POA: Diagnosis not present

## 2022-04-08 DIAGNOSIS — J90 Pleural effusion, not elsewhere classified: Secondary | ICD-10-CM | POA: Diagnosis not present

## 2022-04-08 DIAGNOSIS — I69891 Dysphagia following other cerebrovascular disease: Secondary | ICD-10-CM | POA: Diagnosis not present

## 2022-04-08 DIAGNOSIS — I5030 Unspecified diastolic (congestive) heart failure: Secondary | ICD-10-CM | POA: Diagnosis not present

## 2022-04-08 DIAGNOSIS — I4819 Other persistent atrial fibrillation: Secondary | ICD-10-CM | POA: Diagnosis not present

## 2022-04-08 DIAGNOSIS — I69818 Other symptoms and signs involving cognitive functions following other cerebrovascular disease: Secondary | ICD-10-CM | POA: Diagnosis not present

## 2022-04-08 DIAGNOSIS — D649 Anemia, unspecified: Secondary | ICD-10-CM | POA: Diagnosis not present

## 2022-04-09 DIAGNOSIS — I69818 Other symptoms and signs involving cognitive functions following other cerebrovascular disease: Secondary | ICD-10-CM | POA: Diagnosis not present

## 2022-04-09 DIAGNOSIS — I5030 Unspecified diastolic (congestive) heart failure: Secondary | ICD-10-CM | POA: Diagnosis not present

## 2022-04-09 DIAGNOSIS — I4819 Other persistent atrial fibrillation: Secondary | ICD-10-CM | POA: Diagnosis not present

## 2022-04-09 DIAGNOSIS — L89896 Pressure-induced deep tissue damage of other site: Secondary | ICD-10-CM | POA: Diagnosis not present

## 2022-04-09 DIAGNOSIS — J90 Pleural effusion, not elsewhere classified: Secondary | ICD-10-CM | POA: Diagnosis not present

## 2022-04-09 DIAGNOSIS — D649 Anemia, unspecified: Secondary | ICD-10-CM | POA: Diagnosis not present

## 2022-04-09 DIAGNOSIS — L89150 Pressure ulcer of sacral region, unstageable: Secondary | ICD-10-CM | POA: Diagnosis not present

## 2022-04-09 DIAGNOSIS — I69891 Dysphagia following other cerebrovascular disease: Secondary | ICD-10-CM | POA: Diagnosis not present

## 2022-04-09 DIAGNOSIS — L89890 Pressure ulcer of other site, unstageable: Secondary | ICD-10-CM | POA: Diagnosis not present

## 2022-04-10 DIAGNOSIS — I69818 Other symptoms and signs involving cognitive functions following other cerebrovascular disease: Secondary | ICD-10-CM | POA: Diagnosis not present

## 2022-04-10 DIAGNOSIS — I5030 Unspecified diastolic (congestive) heart failure: Secondary | ICD-10-CM | POA: Diagnosis not present

## 2022-04-10 DIAGNOSIS — J90 Pleural effusion, not elsewhere classified: Secondary | ICD-10-CM | POA: Diagnosis not present

## 2022-04-10 DIAGNOSIS — I69891 Dysphagia following other cerebrovascular disease: Secondary | ICD-10-CM | POA: Diagnosis not present

## 2022-04-10 DIAGNOSIS — D649 Anemia, unspecified: Secondary | ICD-10-CM | POA: Diagnosis not present

## 2022-04-10 DIAGNOSIS — I4819 Other persistent atrial fibrillation: Secondary | ICD-10-CM | POA: Diagnosis not present

## 2022-04-12 DIAGNOSIS — Z515 Encounter for palliative care: Secondary | ICD-10-CM | POA: Diagnosis not present

## 2022-04-12 DIAGNOSIS — F03A0 Unspecified dementia, mild, without behavioral disturbance, psychotic disturbance, mood disturbance, and anxiety: Secondary | ICD-10-CM | POA: Diagnosis not present

## 2022-04-12 DIAGNOSIS — C8590 Non-Hodgkin lymphoma, unspecified, unspecified site: Secondary | ICD-10-CM | POA: Diagnosis not present

## 2022-04-12 DIAGNOSIS — D649 Anemia, unspecified: Secondary | ICD-10-CM | POA: Diagnosis not present

## 2022-04-12 DIAGNOSIS — I5030 Unspecified diastolic (congestive) heart failure: Secondary | ICD-10-CM | POA: Diagnosis not present

## 2022-04-12 DIAGNOSIS — I4891 Unspecified atrial fibrillation: Secondary | ICD-10-CM | POA: Diagnosis not present

## 2022-04-12 DIAGNOSIS — I69891 Dysphagia following other cerebrovascular disease: Secondary | ICD-10-CM | POA: Diagnosis not present

## 2022-04-12 DIAGNOSIS — R627 Adult failure to thrive: Secondary | ICD-10-CM | POA: Diagnosis not present

## 2022-04-12 DIAGNOSIS — I951 Orthostatic hypotension: Secondary | ICD-10-CM | POA: Diagnosis not present

## 2022-04-12 DIAGNOSIS — J90 Pleural effusion, not elsewhere classified: Secondary | ICD-10-CM | POA: Diagnosis not present

## 2022-04-12 DIAGNOSIS — I69818 Other symptoms and signs involving cognitive functions following other cerebrovascular disease: Secondary | ICD-10-CM | POA: Diagnosis not present

## 2022-04-12 DIAGNOSIS — I4819 Other persistent atrial fibrillation: Secondary | ICD-10-CM | POA: Diagnosis not present

## 2022-04-15 DIAGNOSIS — I5030 Unspecified diastolic (congestive) heart failure: Secondary | ICD-10-CM | POA: Diagnosis not present

## 2022-04-15 DIAGNOSIS — J90 Pleural effusion, not elsewhere classified: Secondary | ICD-10-CM | POA: Diagnosis not present

## 2022-04-15 DIAGNOSIS — I69891 Dysphagia following other cerebrovascular disease: Secondary | ICD-10-CM | POA: Diagnosis not present

## 2022-04-15 DIAGNOSIS — D649 Anemia, unspecified: Secondary | ICD-10-CM | POA: Diagnosis not present

## 2022-04-15 DIAGNOSIS — I4819 Other persistent atrial fibrillation: Secondary | ICD-10-CM | POA: Diagnosis not present

## 2022-04-15 DIAGNOSIS — I69818 Other symptoms and signs involving cognitive functions following other cerebrovascular disease: Secondary | ICD-10-CM | POA: Diagnosis not present

## 2022-04-16 DIAGNOSIS — L89896 Pressure-induced deep tissue damage of other site: Secondary | ICD-10-CM | POA: Diagnosis not present

## 2022-04-16 DIAGNOSIS — L89156 Pressure-induced deep tissue damage of sacral region: Secondary | ICD-10-CM | POA: Diagnosis not present

## 2022-04-16 DIAGNOSIS — L89893 Pressure ulcer of other site, stage 3: Secondary | ICD-10-CM | POA: Diagnosis not present

## 2022-04-17 DIAGNOSIS — I4819 Other persistent atrial fibrillation: Secondary | ICD-10-CM | POA: Diagnosis not present

## 2022-04-17 DIAGNOSIS — I5030 Unspecified diastolic (congestive) heart failure: Secondary | ICD-10-CM | POA: Diagnosis not present

## 2022-04-17 DIAGNOSIS — I69818 Other symptoms and signs involving cognitive functions following other cerebrovascular disease: Secondary | ICD-10-CM | POA: Diagnosis not present

## 2022-04-17 DIAGNOSIS — J90 Pleural effusion, not elsewhere classified: Secondary | ICD-10-CM | POA: Diagnosis not present

## 2022-04-17 DIAGNOSIS — I69891 Dysphagia following other cerebrovascular disease: Secondary | ICD-10-CM | POA: Diagnosis not present

## 2022-04-17 DIAGNOSIS — D649 Anemia, unspecified: Secondary | ICD-10-CM | POA: Diagnosis not present

## 2022-04-19 DIAGNOSIS — I69818 Other symptoms and signs involving cognitive functions following other cerebrovascular disease: Secondary | ICD-10-CM | POA: Diagnosis not present

## 2022-04-19 DIAGNOSIS — I4819 Other persistent atrial fibrillation: Secondary | ICD-10-CM | POA: Diagnosis not present

## 2022-04-19 DIAGNOSIS — I69891 Dysphagia following other cerebrovascular disease: Secondary | ICD-10-CM | POA: Diagnosis not present

## 2022-04-19 DIAGNOSIS — I5030 Unspecified diastolic (congestive) heart failure: Secondary | ICD-10-CM | POA: Diagnosis not present

## 2022-04-19 DIAGNOSIS — J90 Pleural effusion, not elsewhere classified: Secondary | ICD-10-CM | POA: Diagnosis not present

## 2022-04-19 DIAGNOSIS — D649 Anemia, unspecified: Secondary | ICD-10-CM | POA: Diagnosis not present

## 2022-04-23 DIAGNOSIS — I69891 Dysphagia following other cerebrovascular disease: Secondary | ICD-10-CM | POA: Diagnosis not present

## 2022-04-23 DIAGNOSIS — Z9089 Acquired absence of other organs: Secondary | ICD-10-CM | POA: Diagnosis not present

## 2022-04-23 DIAGNOSIS — Z853 Personal history of malignant neoplasm of breast: Secondary | ICD-10-CM | POA: Diagnosis not present

## 2022-04-23 DIAGNOSIS — I5030 Unspecified diastolic (congestive) heart failure: Secondary | ICD-10-CM | POA: Diagnosis not present

## 2022-04-23 DIAGNOSIS — L89896 Pressure-induced deep tissue damage of other site: Secondary | ICD-10-CM | POA: Diagnosis not present

## 2022-04-23 DIAGNOSIS — L89893 Pressure ulcer of other site, stage 3: Secondary | ICD-10-CM | POA: Diagnosis not present

## 2022-04-23 DIAGNOSIS — Z8639 Personal history of other endocrine, nutritional and metabolic disease: Secondary | ICD-10-CM | POA: Diagnosis not present

## 2022-04-23 DIAGNOSIS — M1990 Unspecified osteoarthritis, unspecified site: Secondary | ICD-10-CM | POA: Diagnosis not present

## 2022-04-23 DIAGNOSIS — D649 Anemia, unspecified: Secondary | ICD-10-CM | POA: Diagnosis not present

## 2022-04-23 DIAGNOSIS — L89153 Pressure ulcer of sacral region, stage 3: Secondary | ICD-10-CM | POA: Diagnosis not present

## 2022-04-23 DIAGNOSIS — Z85850 Personal history of malignant neoplasm of thyroid: Secondary | ICD-10-CM | POA: Diagnosis not present

## 2022-04-23 DIAGNOSIS — Z8673 Personal history of transient ischemic attack (TIA), and cerebral infarction without residual deficits: Secondary | ICD-10-CM | POA: Diagnosis not present

## 2022-04-23 DIAGNOSIS — J90 Pleural effusion, not elsewhere classified: Secondary | ICD-10-CM | POA: Diagnosis not present

## 2022-04-23 DIAGNOSIS — E890 Postprocedural hypothyroidism: Secondary | ICD-10-CM | POA: Diagnosis not present

## 2022-04-23 DIAGNOSIS — Z90710 Acquired absence of both cervix and uterus: Secondary | ICD-10-CM | POA: Diagnosis not present

## 2022-04-23 DIAGNOSIS — J690 Pneumonitis due to inhalation of food and vomit: Secondary | ICD-10-CM | POA: Diagnosis not present

## 2022-04-23 DIAGNOSIS — I4819 Other persistent atrial fibrillation: Secondary | ICD-10-CM | POA: Diagnosis not present

## 2022-04-23 DIAGNOSIS — Z6824 Body mass index (BMI) 24.0-24.9, adult: Secondary | ICD-10-CM | POA: Diagnosis not present

## 2022-04-23 DIAGNOSIS — K760 Fatty (change of) liver, not elsewhere classified: Secondary | ICD-10-CM | POA: Diagnosis not present

## 2022-04-23 DIAGNOSIS — Z8572 Personal history of non-Hodgkin lymphomas: Secondary | ICD-10-CM | POA: Diagnosis not present

## 2022-04-23 DIAGNOSIS — N200 Calculus of kidney: Secondary | ICD-10-CM | POA: Diagnosis not present

## 2022-04-23 DIAGNOSIS — Z9884 Bariatric surgery status: Secondary | ICD-10-CM | POA: Diagnosis not present

## 2022-04-23 DIAGNOSIS — Z85118 Personal history of other malignant neoplasm of bronchus and lung: Secondary | ICD-10-CM | POA: Diagnosis not present

## 2022-04-23 DIAGNOSIS — N809 Endometriosis, unspecified: Secondary | ICD-10-CM | POA: Diagnosis not present

## 2022-04-23 DIAGNOSIS — I69818 Other symptoms and signs involving cognitive functions following other cerebrovascular disease: Secondary | ICD-10-CM | POA: Diagnosis not present

## 2022-04-23 DIAGNOSIS — K635 Polyp of colon: Secondary | ICD-10-CM | POA: Diagnosis not present

## 2022-04-23 DIAGNOSIS — F32A Depression, unspecified: Secondary | ICD-10-CM | POA: Diagnosis not present

## 2022-04-24 DIAGNOSIS — D649 Anemia, unspecified: Secondary | ICD-10-CM | POA: Diagnosis not present

## 2022-04-24 DIAGNOSIS — I5030 Unspecified diastolic (congestive) heart failure: Secondary | ICD-10-CM | POA: Diagnosis not present

## 2022-04-24 DIAGNOSIS — I4819 Other persistent atrial fibrillation: Secondary | ICD-10-CM | POA: Diagnosis not present

## 2022-04-24 DIAGNOSIS — I69818 Other symptoms and signs involving cognitive functions following other cerebrovascular disease: Secondary | ICD-10-CM | POA: Diagnosis not present

## 2022-04-24 DIAGNOSIS — I69891 Dysphagia following other cerebrovascular disease: Secondary | ICD-10-CM | POA: Diagnosis not present

## 2022-04-24 DIAGNOSIS — J90 Pleural effusion, not elsewhere classified: Secondary | ICD-10-CM | POA: Diagnosis not present

## 2022-04-26 DIAGNOSIS — I69891 Dysphagia following other cerebrovascular disease: Secondary | ICD-10-CM | POA: Diagnosis not present

## 2022-04-26 DIAGNOSIS — I4819 Other persistent atrial fibrillation: Secondary | ICD-10-CM | POA: Diagnosis not present

## 2022-04-26 DIAGNOSIS — D649 Anemia, unspecified: Secondary | ICD-10-CM | POA: Diagnosis not present

## 2022-04-26 DIAGNOSIS — I5030 Unspecified diastolic (congestive) heart failure: Secondary | ICD-10-CM | POA: Diagnosis not present

## 2022-04-26 DIAGNOSIS — J90 Pleural effusion, not elsewhere classified: Secondary | ICD-10-CM | POA: Diagnosis not present

## 2022-04-26 DIAGNOSIS — I69818 Other symptoms and signs involving cognitive functions following other cerebrovascular disease: Secondary | ICD-10-CM | POA: Diagnosis not present

## 2022-04-29 ENCOUNTER — Telehealth: Payer: Self-pay | Admitting: Internal Medicine

## 2022-04-29 DIAGNOSIS — I4819 Other persistent atrial fibrillation: Secondary | ICD-10-CM | POA: Diagnosis not present

## 2022-04-29 DIAGNOSIS — D649 Anemia, unspecified: Secondary | ICD-10-CM | POA: Diagnosis not present

## 2022-04-29 DIAGNOSIS — J90 Pleural effusion, not elsewhere classified: Secondary | ICD-10-CM | POA: Diagnosis not present

## 2022-04-29 DIAGNOSIS — I5030 Unspecified diastolic (congestive) heart failure: Secondary | ICD-10-CM | POA: Diagnosis not present

## 2022-04-29 DIAGNOSIS — I69818 Other symptoms and signs involving cognitive functions following other cerebrovascular disease: Secondary | ICD-10-CM | POA: Diagnosis not present

## 2022-04-29 DIAGNOSIS — I69891 Dysphagia following other cerebrovascular disease: Secondary | ICD-10-CM | POA: Diagnosis not present

## 2022-04-29 NOTE — Telephone Encounter (Signed)
  Per Answering service message:  Patient requesting a call back regarding end of life care issues; wants to speak to Dr Caryl Comes specifically

## 2022-04-29 NOTE — Telephone Encounter (Signed)
Noted  

## 2022-04-29 NOTE — Telephone Encounter (Signed)
Spoke with pt and her friend from church, Rondel Oh with pt's permission.  She has no urgent needs but would like for Dr Caryl Comes to contact her at his convenience.

## 2022-04-30 DIAGNOSIS — L89896 Pressure-induced deep tissue damage of other site: Secondary | ICD-10-CM | POA: Diagnosis not present

## 2022-04-30 DIAGNOSIS — L89153 Pressure ulcer of sacral region, stage 3: Secondary | ICD-10-CM | POA: Diagnosis not present

## 2022-04-30 DIAGNOSIS — L89893 Pressure ulcer of other site, stage 3: Secondary | ICD-10-CM | POA: Diagnosis not present

## 2022-05-07 DIAGNOSIS — I5030 Unspecified diastolic (congestive) heart failure: Secondary | ICD-10-CM | POA: Diagnosis not present

## 2022-05-07 DIAGNOSIS — L89893 Pressure ulcer of other site, stage 3: Secondary | ICD-10-CM | POA: Diagnosis not present

## 2022-05-07 DIAGNOSIS — I69891 Dysphagia following other cerebrovascular disease: Secondary | ICD-10-CM | POA: Diagnosis not present

## 2022-05-07 DIAGNOSIS — D649 Anemia, unspecified: Secondary | ICD-10-CM | POA: Diagnosis not present

## 2022-05-07 DIAGNOSIS — I69818 Other symptoms and signs involving cognitive functions following other cerebrovascular disease: Secondary | ICD-10-CM | POA: Diagnosis not present

## 2022-05-07 DIAGNOSIS — J90 Pleural effusion, not elsewhere classified: Secondary | ICD-10-CM | POA: Diagnosis not present

## 2022-05-07 DIAGNOSIS — L89896 Pressure-induced deep tissue damage of other site: Secondary | ICD-10-CM | POA: Diagnosis not present

## 2022-05-07 DIAGNOSIS — L89153 Pressure ulcer of sacral region, stage 3: Secondary | ICD-10-CM | POA: Diagnosis not present

## 2022-05-07 DIAGNOSIS — I4819 Other persistent atrial fibrillation: Secondary | ICD-10-CM | POA: Diagnosis not present

## 2022-05-08 DIAGNOSIS — I4819 Other persistent atrial fibrillation: Secondary | ICD-10-CM | POA: Diagnosis not present

## 2022-05-08 DIAGNOSIS — I5030 Unspecified diastolic (congestive) heart failure: Secondary | ICD-10-CM | POA: Diagnosis not present

## 2022-05-08 DIAGNOSIS — I69818 Other symptoms and signs involving cognitive functions following other cerebrovascular disease: Secondary | ICD-10-CM | POA: Diagnosis not present

## 2022-05-08 DIAGNOSIS — D649 Anemia, unspecified: Secondary | ICD-10-CM | POA: Diagnosis not present

## 2022-05-08 DIAGNOSIS — J90 Pleural effusion, not elsewhere classified: Secondary | ICD-10-CM | POA: Diagnosis not present

## 2022-05-08 DIAGNOSIS — I69891 Dysphagia following other cerebrovascular disease: Secondary | ICD-10-CM | POA: Diagnosis not present

## 2022-05-10 DIAGNOSIS — D649 Anemia, unspecified: Secondary | ICD-10-CM | POA: Diagnosis not present

## 2022-05-10 DIAGNOSIS — I69891 Dysphagia following other cerebrovascular disease: Secondary | ICD-10-CM | POA: Diagnosis not present

## 2022-05-10 DIAGNOSIS — J90 Pleural effusion, not elsewhere classified: Secondary | ICD-10-CM | POA: Diagnosis not present

## 2022-05-10 DIAGNOSIS — I69818 Other symptoms and signs involving cognitive functions following other cerebrovascular disease: Secondary | ICD-10-CM | POA: Diagnosis not present

## 2022-05-10 DIAGNOSIS — I4819 Other persistent atrial fibrillation: Secondary | ICD-10-CM | POA: Diagnosis not present

## 2022-05-10 DIAGNOSIS — I5030 Unspecified diastolic (congestive) heart failure: Secondary | ICD-10-CM | POA: Diagnosis not present

## 2022-05-14 DIAGNOSIS — L89893 Pressure ulcer of other site, stage 3: Secondary | ICD-10-CM | POA: Diagnosis not present

## 2022-05-14 DIAGNOSIS — I5030 Unspecified diastolic (congestive) heart failure: Secondary | ICD-10-CM | POA: Diagnosis not present

## 2022-05-14 DIAGNOSIS — L89153 Pressure ulcer of sacral region, stage 3: Secondary | ICD-10-CM | POA: Diagnosis not present

## 2022-05-14 DIAGNOSIS — S51802A Unspecified open wound of left forearm, initial encounter: Secondary | ICD-10-CM | POA: Diagnosis not present

## 2022-05-14 DIAGNOSIS — J90 Pleural effusion, not elsewhere classified: Secondary | ICD-10-CM | POA: Diagnosis not present

## 2022-05-14 DIAGNOSIS — D649 Anemia, unspecified: Secondary | ICD-10-CM | POA: Diagnosis not present

## 2022-05-14 DIAGNOSIS — I4819 Other persistent atrial fibrillation: Secondary | ICD-10-CM | POA: Diagnosis not present

## 2022-05-14 DIAGNOSIS — I69818 Other symptoms and signs involving cognitive functions following other cerebrovascular disease: Secondary | ICD-10-CM | POA: Diagnosis not present

## 2022-05-14 DIAGNOSIS — I69891 Dysphagia following other cerebrovascular disease: Secondary | ICD-10-CM | POA: Diagnosis not present

## 2022-05-15 DIAGNOSIS — I4819 Other persistent atrial fibrillation: Secondary | ICD-10-CM | POA: Diagnosis not present

## 2022-05-15 DIAGNOSIS — I5030 Unspecified diastolic (congestive) heart failure: Secondary | ICD-10-CM | POA: Diagnosis not present

## 2022-05-15 DIAGNOSIS — I69891 Dysphagia following other cerebrovascular disease: Secondary | ICD-10-CM | POA: Diagnosis not present

## 2022-05-15 DIAGNOSIS — D649 Anemia, unspecified: Secondary | ICD-10-CM | POA: Diagnosis not present

## 2022-05-15 DIAGNOSIS — J90 Pleural effusion, not elsewhere classified: Secondary | ICD-10-CM | POA: Diagnosis not present

## 2022-05-15 DIAGNOSIS — I69818 Other symptoms and signs involving cognitive functions following other cerebrovascular disease: Secondary | ICD-10-CM | POA: Diagnosis not present

## 2022-05-17 DIAGNOSIS — I5030 Unspecified diastolic (congestive) heart failure: Secondary | ICD-10-CM | POA: Diagnosis not present

## 2022-05-17 DIAGNOSIS — D649 Anemia, unspecified: Secondary | ICD-10-CM | POA: Diagnosis not present

## 2022-05-17 DIAGNOSIS — J90 Pleural effusion, not elsewhere classified: Secondary | ICD-10-CM | POA: Diagnosis not present

## 2022-05-17 DIAGNOSIS — I69818 Other symptoms and signs involving cognitive functions following other cerebrovascular disease: Secondary | ICD-10-CM | POA: Diagnosis not present

## 2022-05-17 DIAGNOSIS — I69891 Dysphagia following other cerebrovascular disease: Secondary | ICD-10-CM | POA: Diagnosis not present

## 2022-05-17 DIAGNOSIS — I4819 Other persistent atrial fibrillation: Secondary | ICD-10-CM | POA: Diagnosis not present

## 2022-05-21 DIAGNOSIS — I4819 Other persistent atrial fibrillation: Secondary | ICD-10-CM | POA: Diagnosis not present

## 2022-05-21 DIAGNOSIS — I69818 Other symptoms and signs involving cognitive functions following other cerebrovascular disease: Secondary | ICD-10-CM | POA: Diagnosis not present

## 2022-05-21 DIAGNOSIS — I69891 Dysphagia following other cerebrovascular disease: Secondary | ICD-10-CM | POA: Diagnosis not present

## 2022-05-21 DIAGNOSIS — L89153 Pressure ulcer of sacral region, stage 3: Secondary | ICD-10-CM | POA: Diagnosis not present

## 2022-05-21 DIAGNOSIS — I5030 Unspecified diastolic (congestive) heart failure: Secondary | ICD-10-CM | POA: Diagnosis not present

## 2022-05-21 DIAGNOSIS — J90 Pleural effusion, not elsewhere classified: Secondary | ICD-10-CM | POA: Diagnosis not present

## 2022-05-21 DIAGNOSIS — L89893 Pressure ulcer of other site, stage 3: Secondary | ICD-10-CM | POA: Diagnosis not present

## 2022-05-21 DIAGNOSIS — S51802A Unspecified open wound of left forearm, initial encounter: Secondary | ICD-10-CM | POA: Diagnosis not present

## 2022-05-21 DIAGNOSIS — D649 Anemia, unspecified: Secondary | ICD-10-CM | POA: Diagnosis not present

## 2022-05-22 DIAGNOSIS — I69818 Other symptoms and signs involving cognitive functions following other cerebrovascular disease: Secondary | ICD-10-CM | POA: Diagnosis not present

## 2022-05-22 DIAGNOSIS — I5030 Unspecified diastolic (congestive) heart failure: Secondary | ICD-10-CM | POA: Diagnosis not present

## 2022-05-22 DIAGNOSIS — J90 Pleural effusion, not elsewhere classified: Secondary | ICD-10-CM | POA: Diagnosis not present

## 2022-05-22 DIAGNOSIS — I4819 Other persistent atrial fibrillation: Secondary | ICD-10-CM | POA: Diagnosis not present

## 2022-05-22 DIAGNOSIS — D649 Anemia, unspecified: Secondary | ICD-10-CM | POA: Diagnosis not present

## 2022-05-22 DIAGNOSIS — I69891 Dysphagia following other cerebrovascular disease: Secondary | ICD-10-CM | POA: Diagnosis not present

## 2022-05-24 DIAGNOSIS — M1990 Unspecified osteoarthritis, unspecified site: Secondary | ICD-10-CM | POA: Diagnosis not present

## 2022-05-24 DIAGNOSIS — K760 Fatty (change of) liver, not elsewhere classified: Secondary | ICD-10-CM | POA: Diagnosis not present

## 2022-05-24 DIAGNOSIS — N200 Calculus of kidney: Secondary | ICD-10-CM | POA: Diagnosis not present

## 2022-05-24 DIAGNOSIS — Z8572 Personal history of non-Hodgkin lymphomas: Secondary | ICD-10-CM | POA: Diagnosis not present

## 2022-05-24 DIAGNOSIS — J90 Pleural effusion, not elsewhere classified: Secondary | ICD-10-CM | POA: Diagnosis not present

## 2022-05-24 DIAGNOSIS — Z6821 Body mass index (BMI) 21.0-21.9, adult: Secondary | ICD-10-CM | POA: Diagnosis not present

## 2022-05-24 DIAGNOSIS — Z8639 Personal history of other endocrine, nutritional and metabolic disease: Secondary | ICD-10-CM | POA: Diagnosis not present

## 2022-05-24 DIAGNOSIS — Z853 Personal history of malignant neoplasm of breast: Secondary | ICD-10-CM | POA: Diagnosis not present

## 2022-05-24 DIAGNOSIS — N809 Endometriosis, unspecified: Secondary | ICD-10-CM | POA: Diagnosis not present

## 2022-05-24 DIAGNOSIS — Z9884 Bariatric surgery status: Secondary | ICD-10-CM | POA: Diagnosis not present

## 2022-05-24 DIAGNOSIS — K635 Polyp of colon: Secondary | ICD-10-CM | POA: Diagnosis not present

## 2022-05-24 DIAGNOSIS — I69818 Other symptoms and signs involving cognitive functions following other cerebrovascular disease: Secondary | ICD-10-CM | POA: Diagnosis not present

## 2022-05-24 DIAGNOSIS — Z85118 Personal history of other malignant neoplasm of bronchus and lung: Secondary | ICD-10-CM | POA: Diagnosis not present

## 2022-05-24 DIAGNOSIS — J690 Pneumonitis due to inhalation of food and vomit: Secondary | ICD-10-CM | POA: Diagnosis not present

## 2022-05-24 DIAGNOSIS — I4819 Other persistent atrial fibrillation: Secondary | ICD-10-CM | POA: Diagnosis not present

## 2022-05-24 DIAGNOSIS — Z8673 Personal history of transient ischemic attack (TIA), and cerebral infarction without residual deficits: Secondary | ICD-10-CM | POA: Diagnosis not present

## 2022-05-24 DIAGNOSIS — R32 Unspecified urinary incontinence: Secondary | ICD-10-CM | POA: Diagnosis not present

## 2022-05-24 DIAGNOSIS — Z90710 Acquired absence of both cervix and uterus: Secondary | ICD-10-CM | POA: Diagnosis not present

## 2022-05-24 DIAGNOSIS — Z741 Need for assistance with personal care: Secondary | ICD-10-CM | POA: Diagnosis not present

## 2022-05-24 DIAGNOSIS — R159 Full incontinence of feces: Secondary | ICD-10-CM | POA: Diagnosis not present

## 2022-05-24 DIAGNOSIS — I69891 Dysphagia following other cerebrovascular disease: Secondary | ICD-10-CM | POA: Diagnosis not present

## 2022-05-24 DIAGNOSIS — F32A Depression, unspecified: Secondary | ICD-10-CM | POA: Diagnosis not present

## 2022-05-24 DIAGNOSIS — I5030 Unspecified diastolic (congestive) heart failure: Secondary | ICD-10-CM | POA: Diagnosis not present

## 2022-05-24 DIAGNOSIS — Z9981 Dependence on supplemental oxygen: Secondary | ICD-10-CM | POA: Diagnosis not present

## 2022-05-24 DIAGNOSIS — D649 Anemia, unspecified: Secondary | ICD-10-CM | POA: Diagnosis not present

## 2022-05-28 DIAGNOSIS — S51802A Unspecified open wound of left forearm, initial encounter: Secondary | ICD-10-CM | POA: Diagnosis not present

## 2022-05-28 DIAGNOSIS — L89893 Pressure ulcer of other site, stage 3: Secondary | ICD-10-CM | POA: Diagnosis not present

## 2022-05-29 DIAGNOSIS — J90 Pleural effusion, not elsewhere classified: Secondary | ICD-10-CM | POA: Diagnosis not present

## 2022-05-29 DIAGNOSIS — I69891 Dysphagia following other cerebrovascular disease: Secondary | ICD-10-CM | POA: Diagnosis not present

## 2022-05-29 DIAGNOSIS — D649 Anemia, unspecified: Secondary | ICD-10-CM | POA: Diagnosis not present

## 2022-05-29 DIAGNOSIS — I5030 Unspecified diastolic (congestive) heart failure: Secondary | ICD-10-CM | POA: Diagnosis not present

## 2022-05-29 DIAGNOSIS — I4819 Other persistent atrial fibrillation: Secondary | ICD-10-CM | POA: Diagnosis not present

## 2022-05-29 DIAGNOSIS — I69818 Other symptoms and signs involving cognitive functions following other cerebrovascular disease: Secondary | ICD-10-CM | POA: Diagnosis not present

## 2022-05-30 DIAGNOSIS — I5030 Unspecified diastolic (congestive) heart failure: Secondary | ICD-10-CM | POA: Diagnosis not present

## 2022-05-30 DIAGNOSIS — J90 Pleural effusion, not elsewhere classified: Secondary | ICD-10-CM | POA: Diagnosis not present

## 2022-05-30 DIAGNOSIS — I69818 Other symptoms and signs involving cognitive functions following other cerebrovascular disease: Secondary | ICD-10-CM | POA: Diagnosis not present

## 2022-05-30 DIAGNOSIS — D649 Anemia, unspecified: Secondary | ICD-10-CM | POA: Diagnosis not present

## 2022-05-30 DIAGNOSIS — I69891 Dysphagia following other cerebrovascular disease: Secondary | ICD-10-CM | POA: Diagnosis not present

## 2022-05-30 DIAGNOSIS — I4819 Other persistent atrial fibrillation: Secondary | ICD-10-CM | POA: Diagnosis not present

## 2022-05-31 DIAGNOSIS — I4819 Other persistent atrial fibrillation: Secondary | ICD-10-CM | POA: Diagnosis not present

## 2022-05-31 DIAGNOSIS — I69891 Dysphagia following other cerebrovascular disease: Secondary | ICD-10-CM | POA: Diagnosis not present

## 2022-05-31 DIAGNOSIS — J90 Pleural effusion, not elsewhere classified: Secondary | ICD-10-CM | POA: Diagnosis not present

## 2022-05-31 DIAGNOSIS — D649 Anemia, unspecified: Secondary | ICD-10-CM | POA: Diagnosis not present

## 2022-05-31 DIAGNOSIS — I69818 Other symptoms and signs involving cognitive functions following other cerebrovascular disease: Secondary | ICD-10-CM | POA: Diagnosis not present

## 2022-05-31 DIAGNOSIS — I5030 Unspecified diastolic (congestive) heart failure: Secondary | ICD-10-CM | POA: Diagnosis not present

## 2022-06-03 DIAGNOSIS — F4323 Adjustment disorder with mixed anxiety and depressed mood: Secondary | ICD-10-CM | POA: Diagnosis not present

## 2022-06-04 DIAGNOSIS — J90 Pleural effusion, not elsewhere classified: Secondary | ICD-10-CM | POA: Diagnosis not present

## 2022-06-04 DIAGNOSIS — L89153 Pressure ulcer of sacral region, stage 3: Secondary | ICD-10-CM | POA: Diagnosis not present

## 2022-06-04 DIAGNOSIS — I4819 Other persistent atrial fibrillation: Secondary | ICD-10-CM | POA: Diagnosis not present

## 2022-06-04 DIAGNOSIS — I69891 Dysphagia following other cerebrovascular disease: Secondary | ICD-10-CM | POA: Diagnosis not present

## 2022-06-04 DIAGNOSIS — D649 Anemia, unspecified: Secondary | ICD-10-CM | POA: Diagnosis not present

## 2022-06-04 DIAGNOSIS — I5030 Unspecified diastolic (congestive) heart failure: Secondary | ICD-10-CM | POA: Diagnosis not present

## 2022-06-04 DIAGNOSIS — I69818 Other symptoms and signs involving cognitive functions following other cerebrovascular disease: Secondary | ICD-10-CM | POA: Diagnosis not present

## 2022-06-05 DIAGNOSIS — D649 Anemia, unspecified: Secondary | ICD-10-CM | POA: Diagnosis not present

## 2022-06-05 DIAGNOSIS — I5030 Unspecified diastolic (congestive) heart failure: Secondary | ICD-10-CM | POA: Diagnosis not present

## 2022-06-05 DIAGNOSIS — I69891 Dysphagia following other cerebrovascular disease: Secondary | ICD-10-CM | POA: Diagnosis not present

## 2022-06-05 DIAGNOSIS — I4819 Other persistent atrial fibrillation: Secondary | ICD-10-CM | POA: Diagnosis not present

## 2022-06-05 DIAGNOSIS — J90 Pleural effusion, not elsewhere classified: Secondary | ICD-10-CM | POA: Diagnosis not present

## 2022-06-05 DIAGNOSIS — I69818 Other symptoms and signs involving cognitive functions following other cerebrovascular disease: Secondary | ICD-10-CM | POA: Diagnosis not present

## 2022-06-06 DIAGNOSIS — I4819 Other persistent atrial fibrillation: Secondary | ICD-10-CM | POA: Diagnosis not present

## 2022-06-06 DIAGNOSIS — D649 Anemia, unspecified: Secondary | ICD-10-CM | POA: Diagnosis not present

## 2022-06-06 DIAGNOSIS — J90 Pleural effusion, not elsewhere classified: Secondary | ICD-10-CM | POA: Diagnosis not present

## 2022-06-06 DIAGNOSIS — I69891 Dysphagia following other cerebrovascular disease: Secondary | ICD-10-CM | POA: Diagnosis not present

## 2022-06-06 DIAGNOSIS — I5030 Unspecified diastolic (congestive) heart failure: Secondary | ICD-10-CM | POA: Diagnosis not present

## 2022-06-06 DIAGNOSIS — I69818 Other symptoms and signs involving cognitive functions following other cerebrovascular disease: Secondary | ICD-10-CM | POA: Diagnosis not present

## 2022-06-07 DIAGNOSIS — I5030 Unspecified diastolic (congestive) heart failure: Secondary | ICD-10-CM | POA: Diagnosis not present

## 2022-06-07 DIAGNOSIS — J90 Pleural effusion, not elsewhere classified: Secondary | ICD-10-CM | POA: Diagnosis not present

## 2022-06-07 DIAGNOSIS — I69891 Dysphagia following other cerebrovascular disease: Secondary | ICD-10-CM | POA: Diagnosis not present

## 2022-06-07 DIAGNOSIS — I4819 Other persistent atrial fibrillation: Secondary | ICD-10-CM | POA: Diagnosis not present

## 2022-06-07 DIAGNOSIS — I69818 Other symptoms and signs involving cognitive functions following other cerebrovascular disease: Secondary | ICD-10-CM | POA: Diagnosis not present

## 2022-06-07 DIAGNOSIS — D649 Anemia, unspecified: Secondary | ICD-10-CM | POA: Diagnosis not present

## 2022-06-10 DIAGNOSIS — I5030 Unspecified diastolic (congestive) heart failure: Secondary | ICD-10-CM | POA: Diagnosis not present

## 2022-06-10 DIAGNOSIS — J90 Pleural effusion, not elsewhere classified: Secondary | ICD-10-CM | POA: Diagnosis not present

## 2022-06-10 DIAGNOSIS — I69818 Other symptoms and signs involving cognitive functions following other cerebrovascular disease: Secondary | ICD-10-CM | POA: Diagnosis not present

## 2022-06-10 DIAGNOSIS — I4819 Other persistent atrial fibrillation: Secondary | ICD-10-CM | POA: Diagnosis not present

## 2022-06-10 DIAGNOSIS — D649 Anemia, unspecified: Secondary | ICD-10-CM | POA: Diagnosis not present

## 2022-06-10 DIAGNOSIS — I69891 Dysphagia following other cerebrovascular disease: Secondary | ICD-10-CM | POA: Diagnosis not present

## 2022-06-11 DIAGNOSIS — D649 Anemia, unspecified: Secondary | ICD-10-CM | POA: Diagnosis not present

## 2022-06-11 DIAGNOSIS — I5030 Unspecified diastolic (congestive) heart failure: Secondary | ICD-10-CM | POA: Diagnosis not present

## 2022-06-11 DIAGNOSIS — L89153 Pressure ulcer of sacral region, stage 3: Secondary | ICD-10-CM | POA: Diagnosis not present

## 2022-06-11 DIAGNOSIS — J90 Pleural effusion, not elsewhere classified: Secondary | ICD-10-CM | POA: Diagnosis not present

## 2022-06-11 DIAGNOSIS — I4819 Other persistent atrial fibrillation: Secondary | ICD-10-CM | POA: Diagnosis not present

## 2022-06-11 DIAGNOSIS — I69891 Dysphagia following other cerebrovascular disease: Secondary | ICD-10-CM | POA: Diagnosis not present

## 2022-06-11 DIAGNOSIS — I69818 Other symptoms and signs involving cognitive functions following other cerebrovascular disease: Secondary | ICD-10-CM | POA: Diagnosis not present

## 2022-06-18 DIAGNOSIS — L89153 Pressure ulcer of sacral region, stage 3: Secondary | ICD-10-CM | POA: Diagnosis not present

## 2022-06-18 DIAGNOSIS — D649 Anemia, unspecified: Secondary | ICD-10-CM | POA: Diagnosis not present

## 2022-06-18 DIAGNOSIS — I69891 Dysphagia following other cerebrovascular disease: Secondary | ICD-10-CM | POA: Diagnosis not present

## 2022-06-18 DIAGNOSIS — I69818 Other symptoms and signs involving cognitive functions following other cerebrovascular disease: Secondary | ICD-10-CM | POA: Diagnosis not present

## 2022-06-18 DIAGNOSIS — J90 Pleural effusion, not elsewhere classified: Secondary | ICD-10-CM | POA: Diagnosis not present

## 2022-06-18 DIAGNOSIS — I5030 Unspecified diastolic (congestive) heart failure: Secondary | ICD-10-CM | POA: Diagnosis not present

## 2022-06-18 DIAGNOSIS — I4819 Other persistent atrial fibrillation: Secondary | ICD-10-CM | POA: Diagnosis not present

## 2022-06-19 DIAGNOSIS — J90 Pleural effusion, not elsewhere classified: Secondary | ICD-10-CM | POA: Diagnosis not present

## 2022-06-19 DIAGNOSIS — D649 Anemia, unspecified: Secondary | ICD-10-CM | POA: Diagnosis not present

## 2022-06-19 DIAGNOSIS — I5030 Unspecified diastolic (congestive) heart failure: Secondary | ICD-10-CM | POA: Diagnosis not present

## 2022-06-19 DIAGNOSIS — I4819 Other persistent atrial fibrillation: Secondary | ICD-10-CM | POA: Diagnosis not present

## 2022-06-19 DIAGNOSIS — I69891 Dysphagia following other cerebrovascular disease: Secondary | ICD-10-CM | POA: Diagnosis not present

## 2022-06-19 DIAGNOSIS — I69818 Other symptoms and signs involving cognitive functions following other cerebrovascular disease: Secondary | ICD-10-CM | POA: Diagnosis not present

## 2022-06-22 DIAGNOSIS — I69891 Dysphagia following other cerebrovascular disease: Secondary | ICD-10-CM | POA: Diagnosis not present

## 2022-06-22 DIAGNOSIS — I5030 Unspecified diastolic (congestive) heart failure: Secondary | ICD-10-CM | POA: Diagnosis not present

## 2022-06-22 DIAGNOSIS — I4819 Other persistent atrial fibrillation: Secondary | ICD-10-CM | POA: Diagnosis not present

## 2022-06-22 DIAGNOSIS — J90 Pleural effusion, not elsewhere classified: Secondary | ICD-10-CM | POA: Diagnosis not present

## 2022-06-22 DIAGNOSIS — I69818 Other symptoms and signs involving cognitive functions following other cerebrovascular disease: Secondary | ICD-10-CM | POA: Diagnosis not present

## 2022-06-22 DIAGNOSIS — D649 Anemia, unspecified: Secondary | ICD-10-CM | POA: Diagnosis not present

## 2022-06-23 DIAGNOSIS — Z9884 Bariatric surgery status: Secondary | ICD-10-CM | POA: Diagnosis not present

## 2022-06-23 DIAGNOSIS — Z9981 Dependence on supplemental oxygen: Secondary | ICD-10-CM | POA: Diagnosis not present

## 2022-06-23 DIAGNOSIS — I69818 Other symptoms and signs involving cognitive functions following other cerebrovascular disease: Secondary | ICD-10-CM | POA: Diagnosis not present

## 2022-06-23 DIAGNOSIS — N200 Calculus of kidney: Secondary | ICD-10-CM | POA: Diagnosis not present

## 2022-06-23 DIAGNOSIS — I69891 Dysphagia following other cerebrovascular disease: Secondary | ICD-10-CM | POA: Diagnosis not present

## 2022-06-23 DIAGNOSIS — Z90710 Acquired absence of both cervix and uterus: Secondary | ICD-10-CM | POA: Diagnosis not present

## 2022-06-23 DIAGNOSIS — M1990 Unspecified osteoarthritis, unspecified site: Secondary | ICD-10-CM | POA: Diagnosis not present

## 2022-06-23 DIAGNOSIS — J690 Pneumonitis due to inhalation of food and vomit: Secondary | ICD-10-CM | POA: Diagnosis not present

## 2022-06-23 DIAGNOSIS — Z8673 Personal history of transient ischemic attack (TIA), and cerebral infarction without residual deficits: Secondary | ICD-10-CM | POA: Diagnosis not present

## 2022-06-23 DIAGNOSIS — Z8572 Personal history of non-Hodgkin lymphomas: Secondary | ICD-10-CM | POA: Diagnosis not present

## 2022-06-23 DIAGNOSIS — Z853 Personal history of malignant neoplasm of breast: Secondary | ICD-10-CM | POA: Diagnosis not present

## 2022-06-23 DIAGNOSIS — Z741 Need for assistance with personal care: Secondary | ICD-10-CM | POA: Diagnosis not present

## 2022-06-23 DIAGNOSIS — Z6821 Body mass index (BMI) 21.0-21.9, adult: Secondary | ICD-10-CM | POA: Diagnosis not present

## 2022-06-23 DIAGNOSIS — K635 Polyp of colon: Secondary | ICD-10-CM | POA: Diagnosis not present

## 2022-06-23 DIAGNOSIS — I5030 Unspecified diastolic (congestive) heart failure: Secondary | ICD-10-CM | POA: Diagnosis not present

## 2022-06-23 DIAGNOSIS — N809 Endometriosis, unspecified: Secondary | ICD-10-CM | POA: Diagnosis not present

## 2022-06-23 DIAGNOSIS — D649 Anemia, unspecified: Secondary | ICD-10-CM | POA: Diagnosis not present

## 2022-06-23 DIAGNOSIS — F32A Depression, unspecified: Secondary | ICD-10-CM | POA: Diagnosis not present

## 2022-06-23 DIAGNOSIS — Z85118 Personal history of other malignant neoplasm of bronchus and lung: Secondary | ICD-10-CM | POA: Diagnosis not present

## 2022-06-23 DIAGNOSIS — J90 Pleural effusion, not elsewhere classified: Secondary | ICD-10-CM | POA: Diagnosis not present

## 2022-06-23 DIAGNOSIS — K760 Fatty (change of) liver, not elsewhere classified: Secondary | ICD-10-CM | POA: Diagnosis not present

## 2022-06-23 DIAGNOSIS — Z8639 Personal history of other endocrine, nutritional and metabolic disease: Secondary | ICD-10-CM | POA: Diagnosis not present

## 2022-06-23 DIAGNOSIS — R159 Full incontinence of feces: Secondary | ICD-10-CM | POA: Diagnosis not present

## 2022-06-23 DIAGNOSIS — I4819 Other persistent atrial fibrillation: Secondary | ICD-10-CM | POA: Diagnosis not present

## 2022-06-23 DIAGNOSIS — R32 Unspecified urinary incontinence: Secondary | ICD-10-CM | POA: Diagnosis not present

## 2022-06-24 ENCOUNTER — Encounter: Payer: Medicare Other | Admitting: Physical Medicine and Rehabilitation

## 2022-06-24 DIAGNOSIS — F4323 Adjustment disorder with mixed anxiety and depressed mood: Secondary | ICD-10-CM | POA: Diagnosis not present

## 2022-06-25 DIAGNOSIS — I4819 Other persistent atrial fibrillation: Secondary | ICD-10-CM | POA: Diagnosis not present

## 2022-06-25 DIAGNOSIS — I69818 Other symptoms and signs involving cognitive functions following other cerebrovascular disease: Secondary | ICD-10-CM | POA: Diagnosis not present

## 2022-06-25 DIAGNOSIS — I69891 Dysphagia following other cerebrovascular disease: Secondary | ICD-10-CM | POA: Diagnosis not present

## 2022-06-25 DIAGNOSIS — I5030 Unspecified diastolic (congestive) heart failure: Secondary | ICD-10-CM | POA: Diagnosis not present

## 2022-06-25 DIAGNOSIS — D649 Anemia, unspecified: Secondary | ICD-10-CM | POA: Diagnosis not present

## 2022-06-25 DIAGNOSIS — L89153 Pressure ulcer of sacral region, stage 3: Secondary | ICD-10-CM | POA: Diagnosis not present

## 2022-06-25 DIAGNOSIS — J90 Pleural effusion, not elsewhere classified: Secondary | ICD-10-CM | POA: Diagnosis not present

## 2022-06-27 DIAGNOSIS — I5030 Unspecified diastolic (congestive) heart failure: Secondary | ICD-10-CM | POA: Diagnosis not present

## 2022-06-27 DIAGNOSIS — I4819 Other persistent atrial fibrillation: Secondary | ICD-10-CM | POA: Diagnosis not present

## 2022-06-27 DIAGNOSIS — D649 Anemia, unspecified: Secondary | ICD-10-CM | POA: Diagnosis not present

## 2022-06-27 DIAGNOSIS — I69818 Other symptoms and signs involving cognitive functions following other cerebrovascular disease: Secondary | ICD-10-CM | POA: Diagnosis not present

## 2022-06-27 DIAGNOSIS — I69891 Dysphagia following other cerebrovascular disease: Secondary | ICD-10-CM | POA: Diagnosis not present

## 2022-06-27 DIAGNOSIS — J90 Pleural effusion, not elsewhere classified: Secondary | ICD-10-CM | POA: Diagnosis not present

## 2022-06-28 DIAGNOSIS — I69818 Other symptoms and signs involving cognitive functions following other cerebrovascular disease: Secondary | ICD-10-CM | POA: Diagnosis not present

## 2022-06-28 DIAGNOSIS — I5030 Unspecified diastolic (congestive) heart failure: Secondary | ICD-10-CM | POA: Diagnosis not present

## 2022-06-28 DIAGNOSIS — I69891 Dysphagia following other cerebrovascular disease: Secondary | ICD-10-CM | POA: Diagnosis not present

## 2022-06-28 DIAGNOSIS — I4819 Other persistent atrial fibrillation: Secondary | ICD-10-CM | POA: Diagnosis not present

## 2022-06-28 DIAGNOSIS — J90 Pleural effusion, not elsewhere classified: Secondary | ICD-10-CM | POA: Diagnosis not present

## 2022-06-28 DIAGNOSIS — D649 Anemia, unspecified: Secondary | ICD-10-CM | POA: Diagnosis not present

## 2022-07-02 DIAGNOSIS — I4819 Other persistent atrial fibrillation: Secondary | ICD-10-CM | POA: Diagnosis not present

## 2022-07-02 DIAGNOSIS — J90 Pleural effusion, not elsewhere classified: Secondary | ICD-10-CM | POA: Diagnosis not present

## 2022-07-02 DIAGNOSIS — I69891 Dysphagia following other cerebrovascular disease: Secondary | ICD-10-CM | POA: Diagnosis not present

## 2022-07-02 DIAGNOSIS — L89153 Pressure ulcer of sacral region, stage 3: Secondary | ICD-10-CM | POA: Diagnosis not present

## 2022-07-02 DIAGNOSIS — I69818 Other symptoms and signs involving cognitive functions following other cerebrovascular disease: Secondary | ICD-10-CM | POA: Diagnosis not present

## 2022-07-02 DIAGNOSIS — I5030 Unspecified diastolic (congestive) heart failure: Secondary | ICD-10-CM | POA: Diagnosis not present

## 2022-07-02 DIAGNOSIS — D649 Anemia, unspecified: Secondary | ICD-10-CM | POA: Diagnosis not present

## 2022-07-04 DIAGNOSIS — I69818 Other symptoms and signs involving cognitive functions following other cerebrovascular disease: Secondary | ICD-10-CM | POA: Diagnosis not present

## 2022-07-04 DIAGNOSIS — D649 Anemia, unspecified: Secondary | ICD-10-CM | POA: Diagnosis not present

## 2022-07-04 DIAGNOSIS — I69891 Dysphagia following other cerebrovascular disease: Secondary | ICD-10-CM | POA: Diagnosis not present

## 2022-07-04 DIAGNOSIS — J90 Pleural effusion, not elsewhere classified: Secondary | ICD-10-CM | POA: Diagnosis not present

## 2022-07-04 DIAGNOSIS — I4819 Other persistent atrial fibrillation: Secondary | ICD-10-CM | POA: Diagnosis not present

## 2022-07-04 DIAGNOSIS — I5030 Unspecified diastolic (congestive) heart failure: Secondary | ICD-10-CM | POA: Diagnosis not present

## 2022-07-08 DIAGNOSIS — F411 Generalized anxiety disorder: Secondary | ICD-10-CM | POA: Diagnosis not present

## 2022-07-08 DIAGNOSIS — F32A Depression, unspecified: Secondary | ICD-10-CM | POA: Diagnosis not present

## 2022-07-11 DIAGNOSIS — I69818 Other symptoms and signs involving cognitive functions following other cerebrovascular disease: Secondary | ICD-10-CM | POA: Diagnosis not present

## 2022-07-11 DIAGNOSIS — D649 Anemia, unspecified: Secondary | ICD-10-CM | POA: Diagnosis not present

## 2022-07-11 DIAGNOSIS — I69891 Dysphagia following other cerebrovascular disease: Secondary | ICD-10-CM | POA: Diagnosis not present

## 2022-07-11 DIAGNOSIS — I5030 Unspecified diastolic (congestive) heart failure: Secondary | ICD-10-CM | POA: Diagnosis not present

## 2022-07-11 DIAGNOSIS — I4819 Other persistent atrial fibrillation: Secondary | ICD-10-CM | POA: Diagnosis not present

## 2022-07-11 DIAGNOSIS — J90 Pleural effusion, not elsewhere classified: Secondary | ICD-10-CM | POA: Diagnosis not present

## 2022-07-15 ENCOUNTER — Ambulatory Visit: Payer: Medicare Other | Admitting: Adult Health

## 2022-07-16 DIAGNOSIS — D649 Anemia, unspecified: Secondary | ICD-10-CM | POA: Diagnosis not present

## 2022-07-16 DIAGNOSIS — J90 Pleural effusion, not elsewhere classified: Secondary | ICD-10-CM | POA: Diagnosis not present

## 2022-07-16 DIAGNOSIS — I69891 Dysphagia following other cerebrovascular disease: Secondary | ICD-10-CM | POA: Diagnosis not present

## 2022-07-16 DIAGNOSIS — I5030 Unspecified diastolic (congestive) heart failure: Secondary | ICD-10-CM | POA: Diagnosis not present

## 2022-07-16 DIAGNOSIS — I69818 Other symptoms and signs involving cognitive functions following other cerebrovascular disease: Secondary | ICD-10-CM | POA: Diagnosis not present

## 2022-07-16 DIAGNOSIS — I4819 Other persistent atrial fibrillation: Secondary | ICD-10-CM | POA: Diagnosis not present

## 2022-07-18 DIAGNOSIS — I69891 Dysphagia following other cerebrovascular disease: Secondary | ICD-10-CM | POA: Diagnosis not present

## 2022-07-18 DIAGNOSIS — D649 Anemia, unspecified: Secondary | ICD-10-CM | POA: Diagnosis not present

## 2022-07-18 DIAGNOSIS — I69818 Other symptoms and signs involving cognitive functions following other cerebrovascular disease: Secondary | ICD-10-CM | POA: Diagnosis not present

## 2022-07-18 DIAGNOSIS — I5030 Unspecified diastolic (congestive) heart failure: Secondary | ICD-10-CM | POA: Diagnosis not present

## 2022-07-18 DIAGNOSIS — I4819 Other persistent atrial fibrillation: Secondary | ICD-10-CM | POA: Diagnosis not present

## 2022-07-18 DIAGNOSIS — J90 Pleural effusion, not elsewhere classified: Secondary | ICD-10-CM | POA: Diagnosis not present

## 2022-07-18 IMAGING — CT CT HIP*L* W/O CM
2 of 3 series · 17 of 46 positions shown, 19 images · non-contrast
Comparison: Radiographs 12/15/2020

CLINICAL DATA: Fell.  Left hip pain.

EXAM:
CT OF THE LEFT HIP WITHOUT CONTRAST
TECHNIQUE: Multidetector CT imaging of the left hip was performed according to
the standard protocol. Multiplanar CT image reconstructions were
also generated.

[Series 5: pelvis 2.0 st · axial · 0.45mm/px · z∈[+805,+995]mm · 14 of 109 slices shown, 16 images]
[im 7/109  soft-tissue]
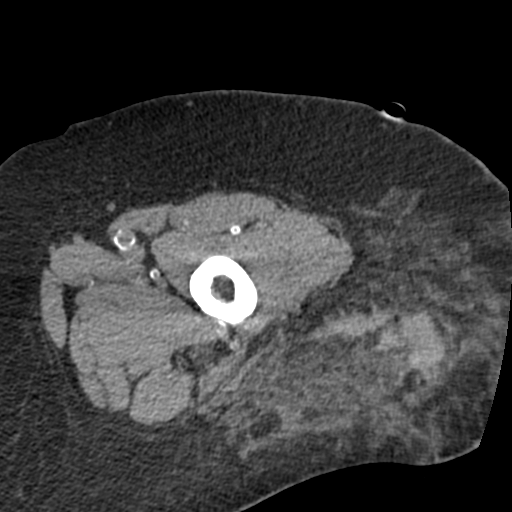
[im 7/109  bone]
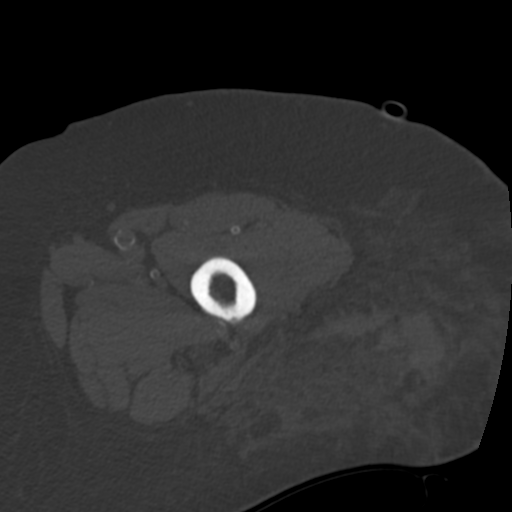
[im 14/109  soft-tissue]
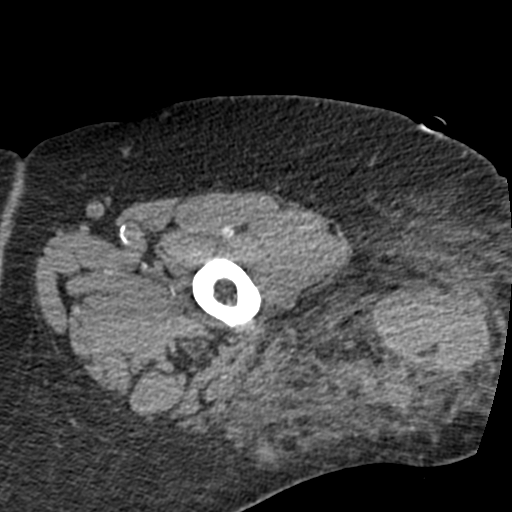
[im 21/109  soft-tissue]
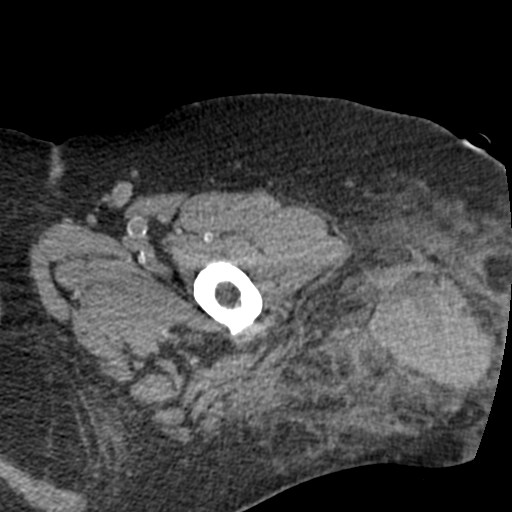
[im 28/109  soft-tissue]
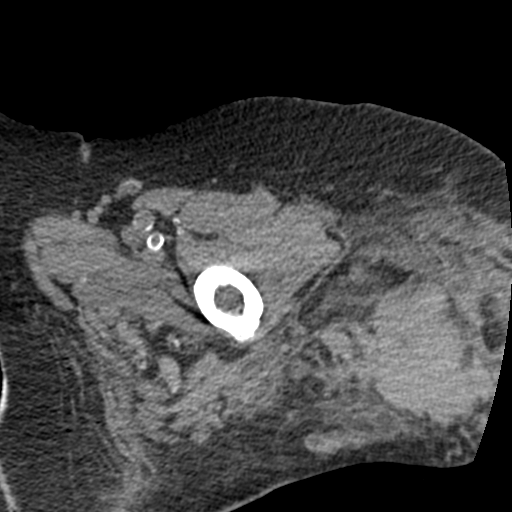
[im 35/109  soft-tissue]
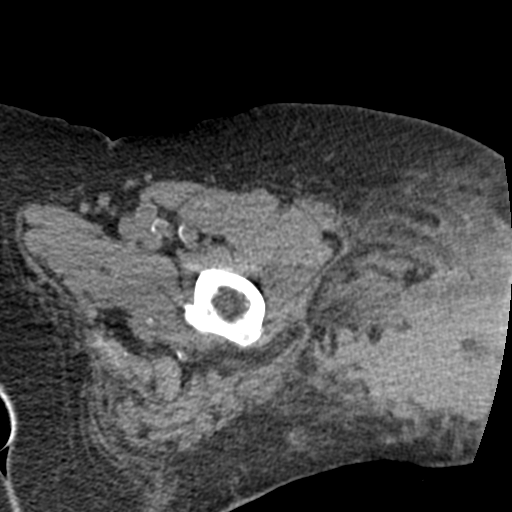
[im 42/109  soft-tissue]
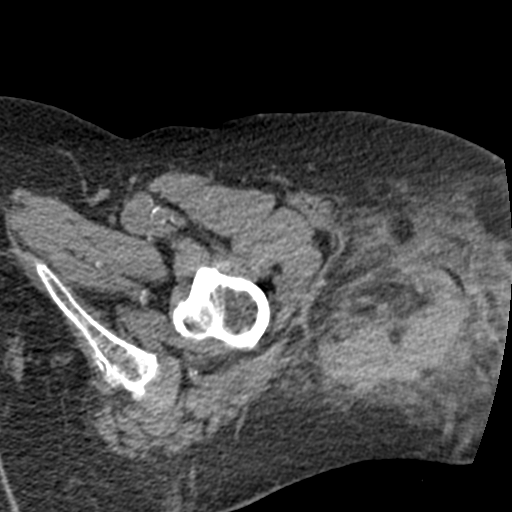
[im 49/109  soft-tissue]
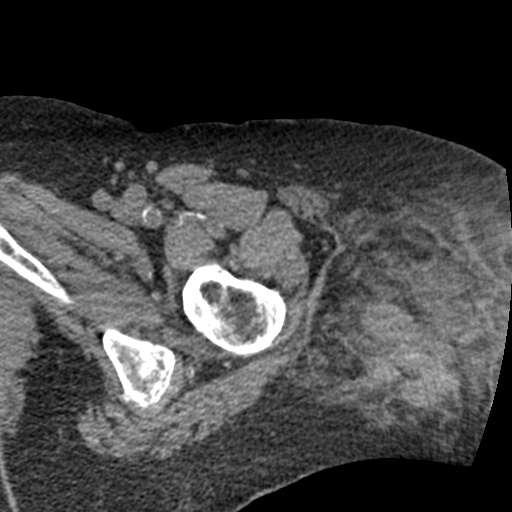
[im 60/109  soft-tissue]
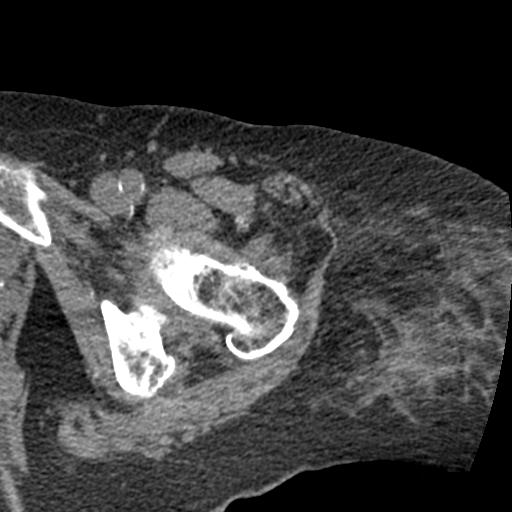
[im 67/109  soft-tissue]
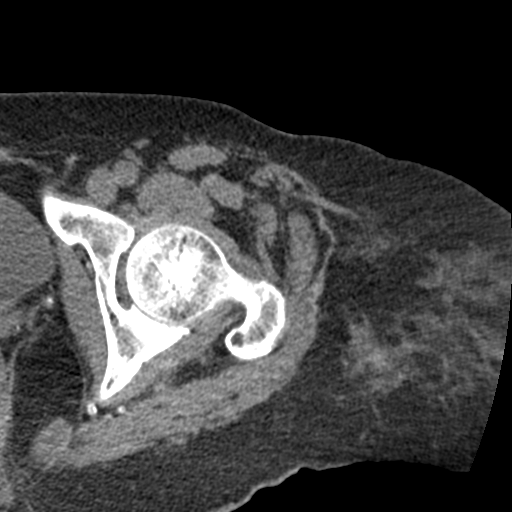
[im 67/109  bone]
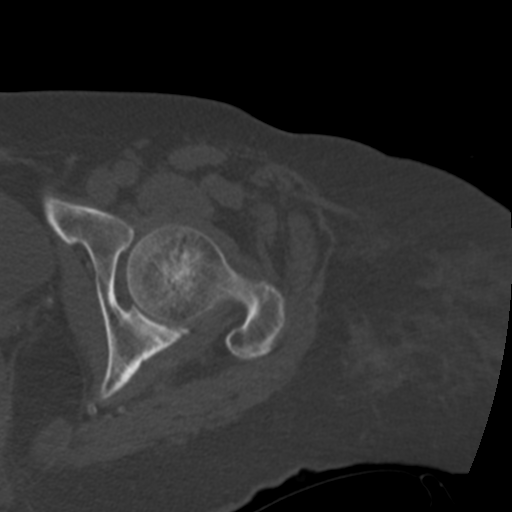
[im 74/109  soft-tissue]
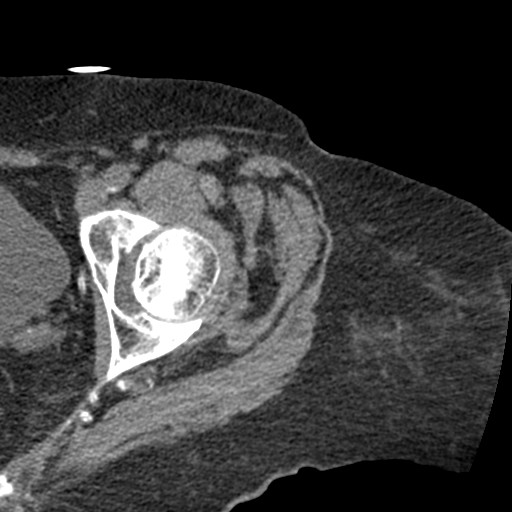
[im 81/109  soft-tissue]
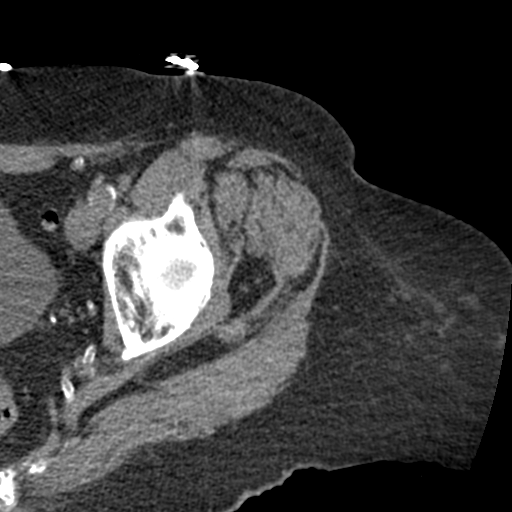
[im 88/109  soft-tissue]
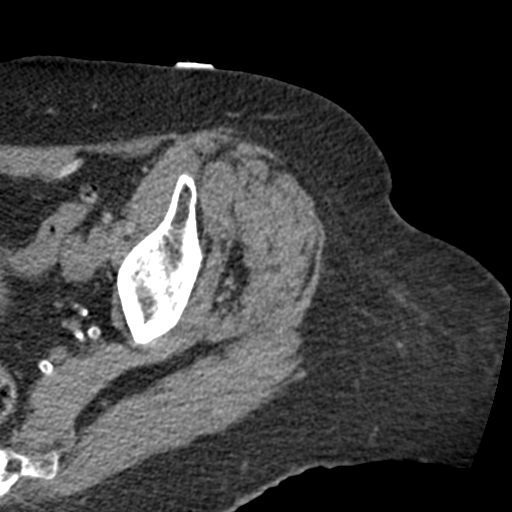
[im 95/109  soft-tissue]
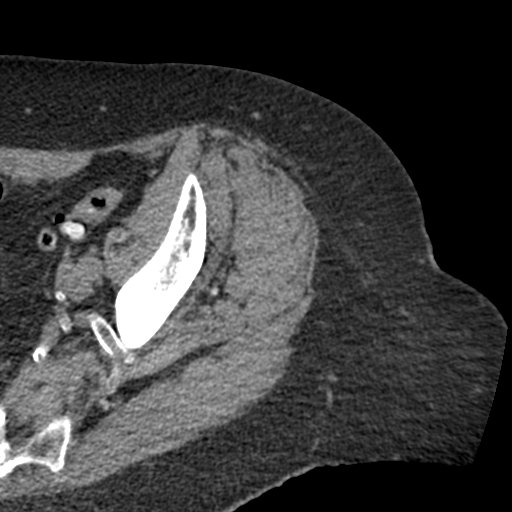
[im 102/109  soft-tissue]
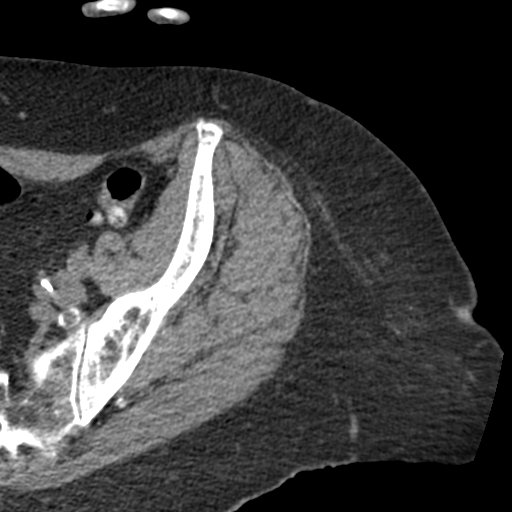

[Series 6: pelvis 2.0 cor. bone · coronal · 0.40mm/px · 3 of 121 slices shown]
[im 41/121  soft-tissue]
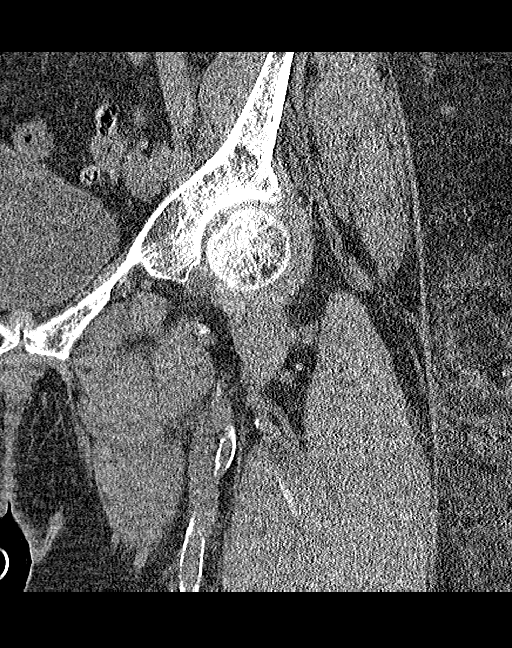
[im 54/121  soft-tissue]
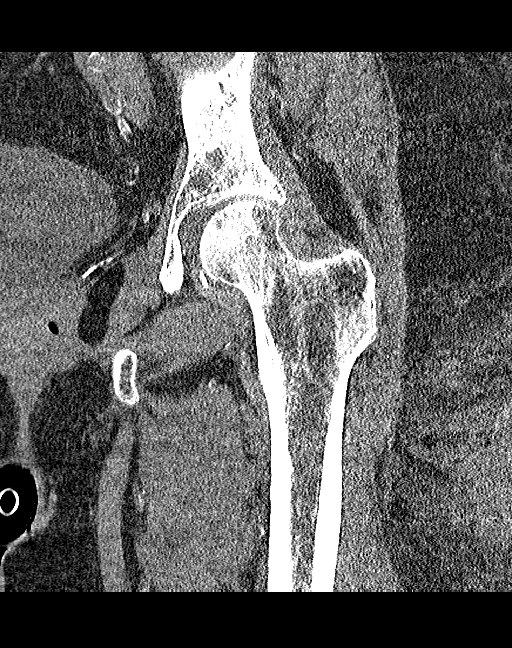
[im 67/121  soft-tissue]
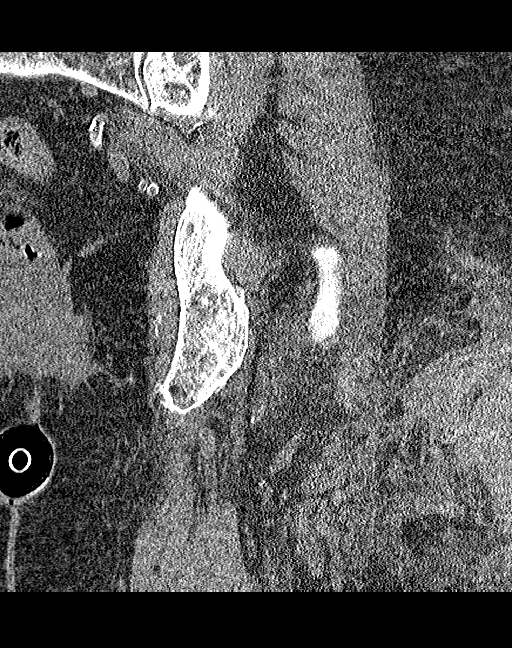

[17 of 46 positions shown; findings below may reference images not displayed]

FINDINGS: The left hip is normally located. No acute hip fracture is
identified. There are moderate degenerative changes. No evidence of
AVN.

The left hemipelvic bony structures are intact. No pelvic fractures.

There is a large subcutaneous hematoma overlying the left hip. I do
not see an obvious muscle tear or intramuscular hematoma.

No significant left-sided intrapelvic abnormalities are identified.
Incidental note is made of diverticulosis and moderate to advanced
vascular calcifications.
IMPRESSION: 1. Large subcutaneous hematoma overlying the left hip.
2. No acute hip or left-sided pelvic fractures.
3. Moderate degenerative changes involving the left hip.
4. Aortic atherosclerosis.

Aortic Atherosclerosis ([id number]-WQY.Y).

## 2022-07-18 IMAGING — CT CT CHEST W/O CM
2 of 3 series · 15 of 36 positions shown, 18 images · non-contrast
Comparison: August 21, 2020.

CLINICAL DATA: Rib fractures.  History of non-Hodgkin's lymphoma.

EXAM:
CT CHEST WITHOUT CONTRAST
TECHNIQUE: Multidetector CT imaging of the chest was performed following the
standard protocol without IV contrast.

[Series 3: chest w/o 2mm st · axial · non-contrast · 0.91mm/px · z∈[+1187,+1461]mm · 12 of 161 slices shown, 15 images]
[im 12/161  mediastinal]
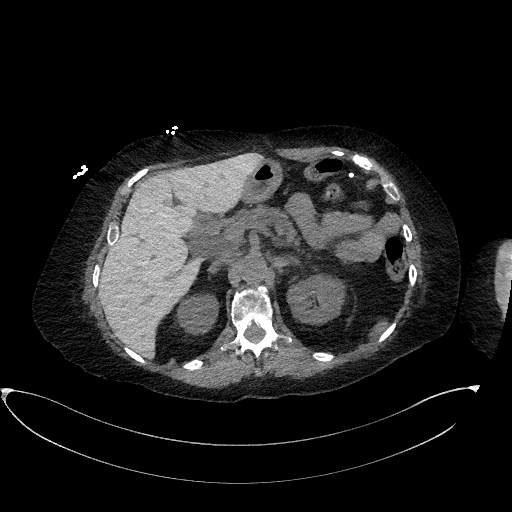
[im 12/161  lung]
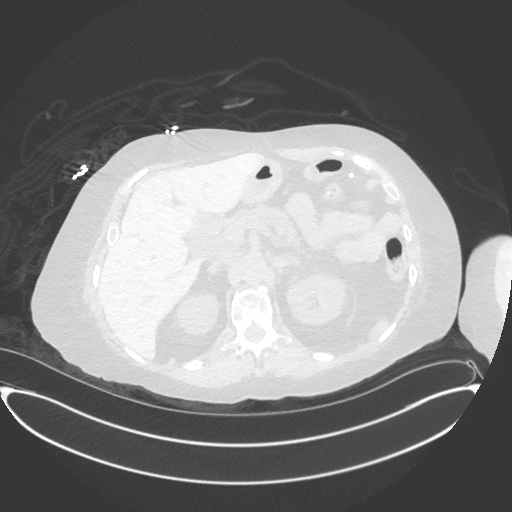
[im 24/161  lung]
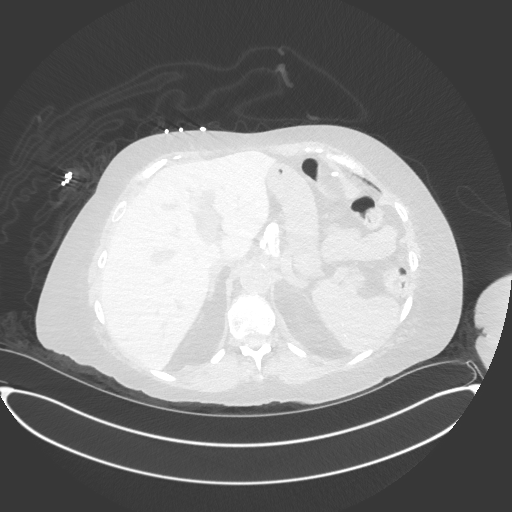
[im 36/161  lung]
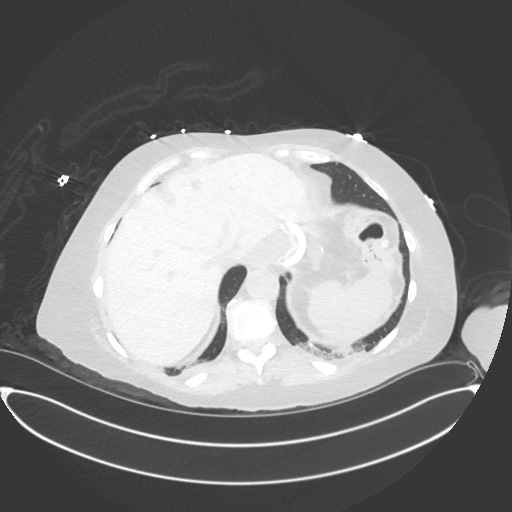
[im 48/161  lung]
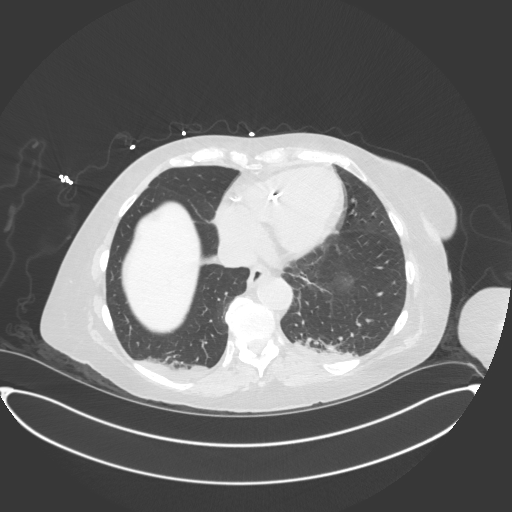
[im 60/161  mediastinal]
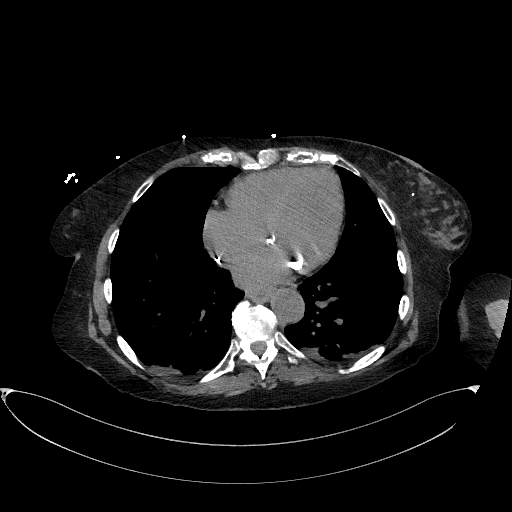
[im 60/161  lung]
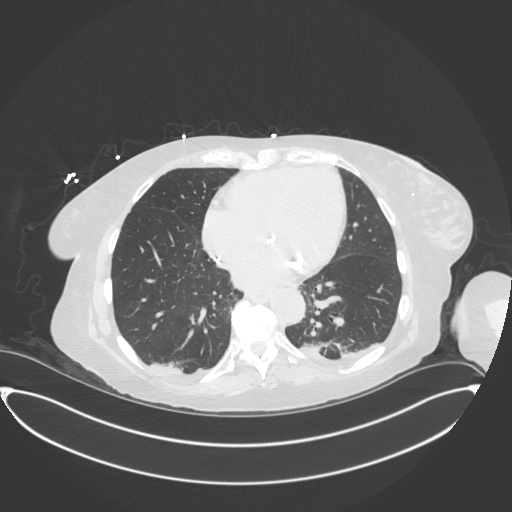
[im 72/161  lung]
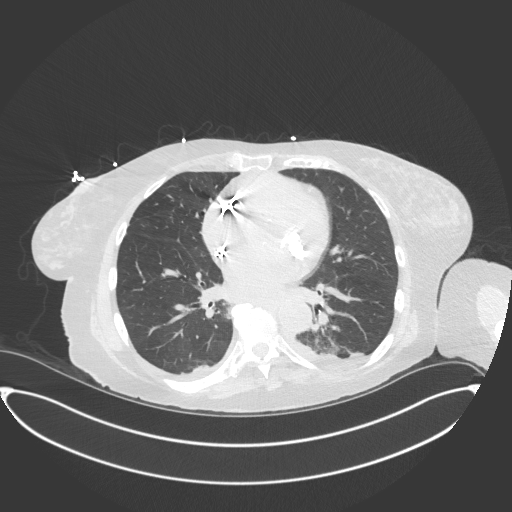
[im 89/161  lung]
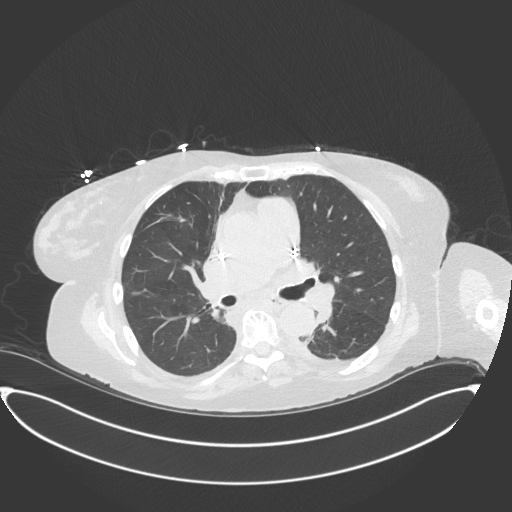
[im 101/161  lung]
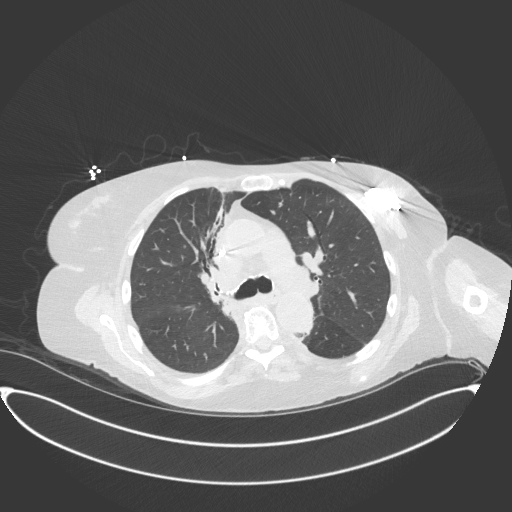
[im 113/161  mediastinal]
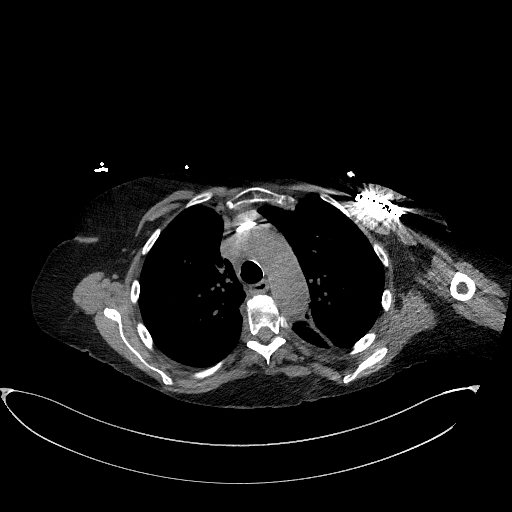
[im 113/161  lung]
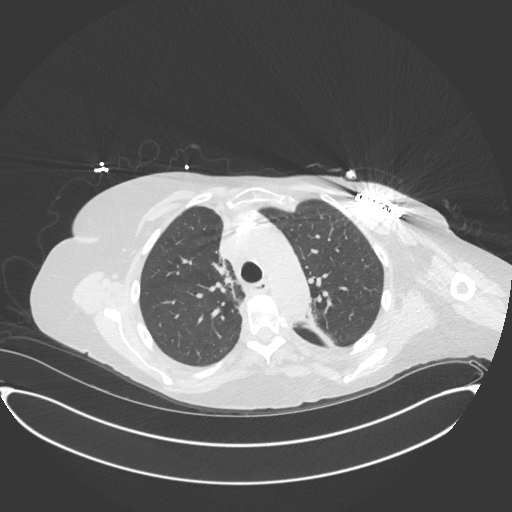
[im 125/161  lung]
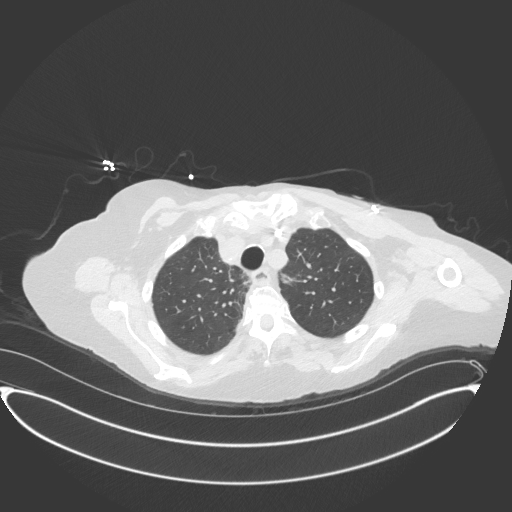
[im 137/161  lung]
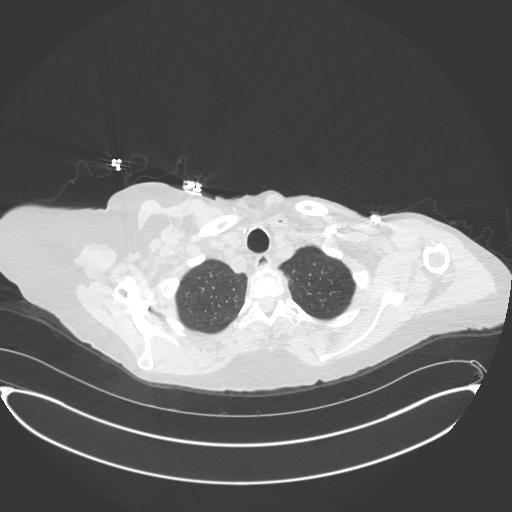
[im 149/161  lung]
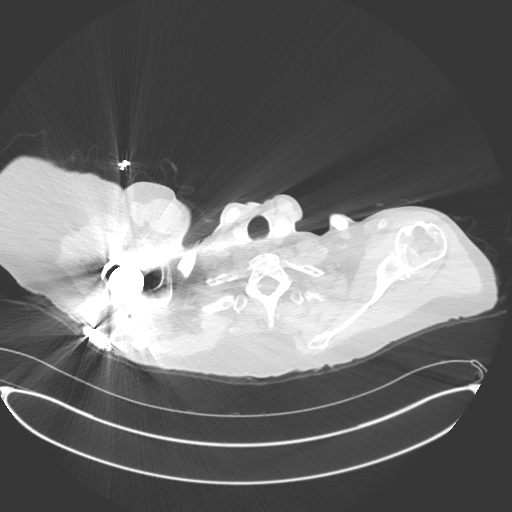

[Series 6: chest w/o 2mm st cor · coronal · non-contrast · 0.63mm/px · 3 of 151 slices shown]
[im 31/151  lung]
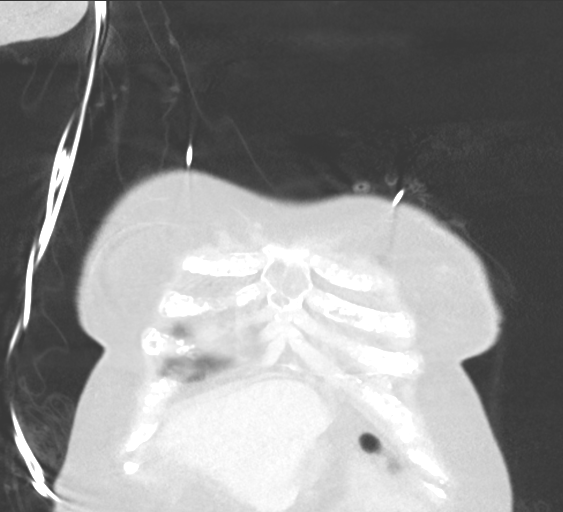
[im 61/151  lung]
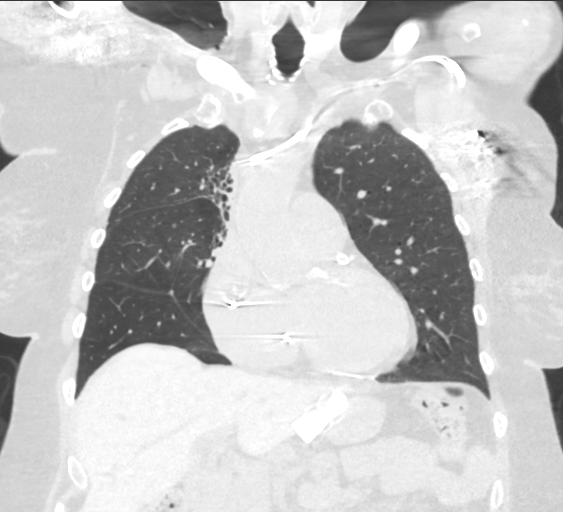
[im 91/151  lung]
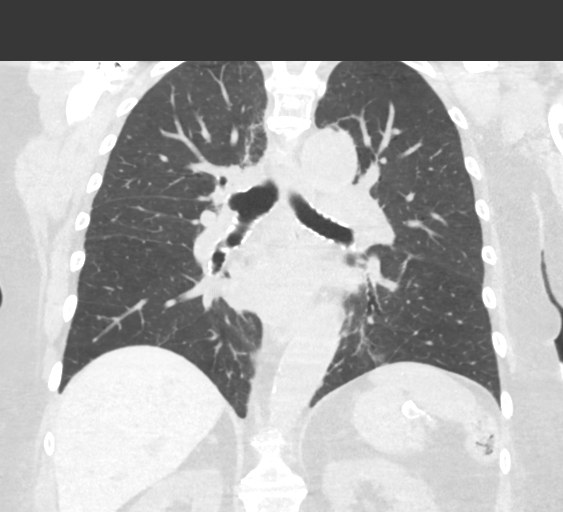

[15 of 36 positions shown; findings below may reference images not displayed]

FINDINGS: Cardiovascular: Atherosclerosis of thoracic aorta is noted without
aneurysm formation. Normal cardiac size. No pericardial effusion.
Left-sided pacemaker is in grossly good position. Coronary artery
calcifications are noted.

Mediastinum/Nodes: Small sliding-type hiatal hernia. Thyroid gland
is unremarkable. No definite adenopathy is noted, although
evaluation is significantly limited due the lack of intravenous
contrast.

Lungs/Pleura: No pneumothorax or significant pleural effusion is
noted. Mild bilateral posterior basilar subsegmental atelectasis is
noted. Probable scarring or fibrosis is noted in the right upper
lobe.

Upper Abdomen: Lap band device is noted. Bilateral nonobstructive
nephrolithiasis is noted.

Musculoskeletal: No chest wall mass or suspicious bone lesions
identified. Status post right total shoulder arthroplasty.
IMPRESSION: 1. No definite rib fracture is noted.
2. Coronary artery calcifications are noted suggesting coronary
artery disease.
3. Small sliding-type hiatal hernia.
4. Bilateral nonobstructive nephrolithiasis.
5. Probable scarring or fibrosis is noted in the right upper lobe.
6. Aortic atherosclerosis.

Aortic Atherosclerosis ([id number]-B67.7).

## 2022-07-18 IMAGING — CT CT CERVICAL SPINE W/O CM
3 of 4 series · 11 of 33 positions shown, 13 images · non-contrast
Comparison: MR head and cervical spine 07/29/2018, CT head and
cervical spine 12/02/2018

CLINICAL DATA: Fall, on anticoagulation

EXAM:
CT HEAD WITHOUT CONTRAST
CT CERVICAL SPINE WITHOUT CONTRAST
TECHNIQUE: Multidetector CT imaging of the head and cervical spine was
performed following the standard protocol without intravenous
contrast. Multiplanar CT image reconstructions of the cervical spine
were also generated.

[Series 4: c_spine 2.0 st · axial · 0.24mm/px · z∈[-267,-151]mm · 3 of 88 slices shown, 4 images]
[im 15/88  soft-tissue]
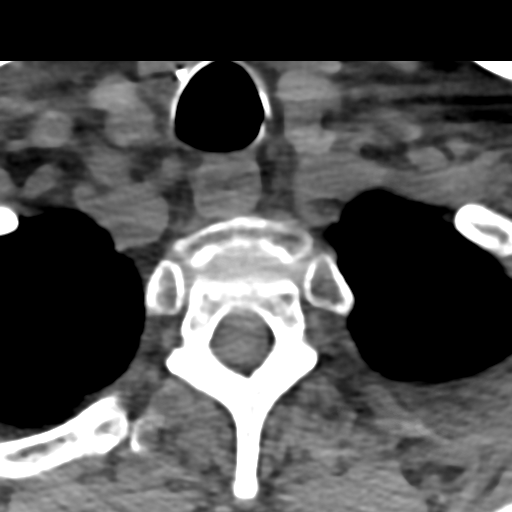
[im 15/88  bone]
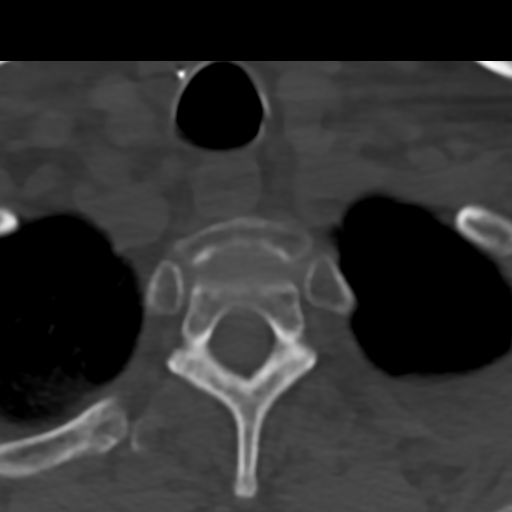
[im 44/88  bone]
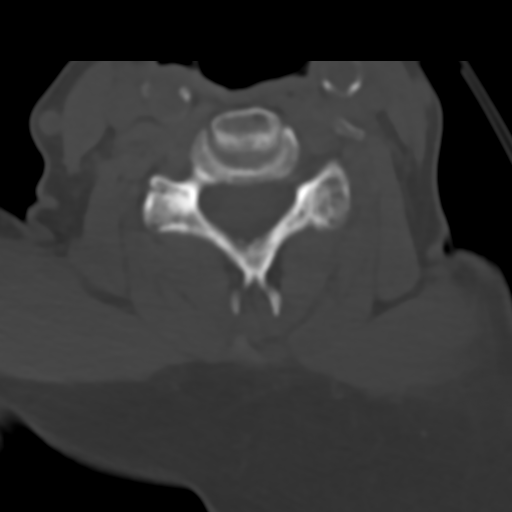
[im 73/88  bone]
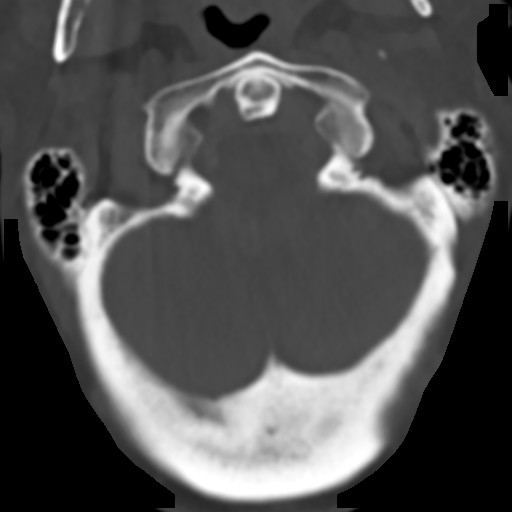

[Series 8: c_spine 2.0 sag bone · sagittal · 0.26mm/px · 5 of 61 slices shown, 6 images]
[im 21/61  bone]
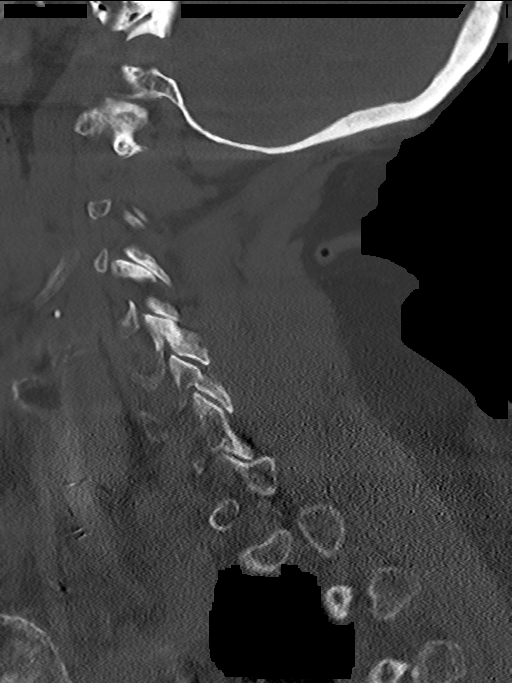
[im 26/61  bone]
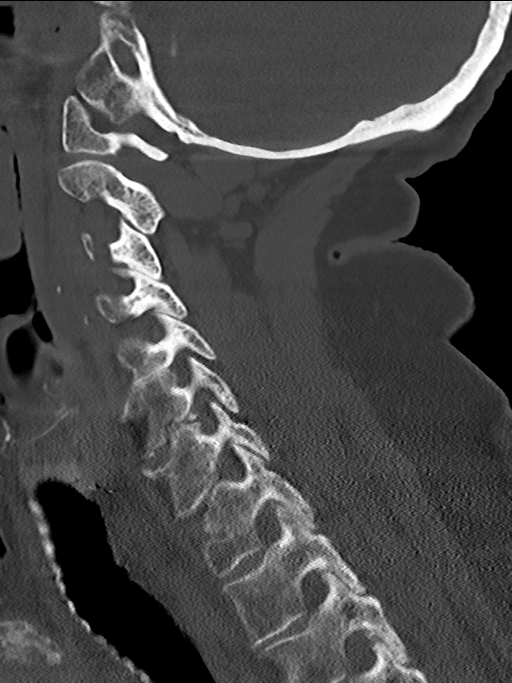
[im 31/61  soft-tissue]
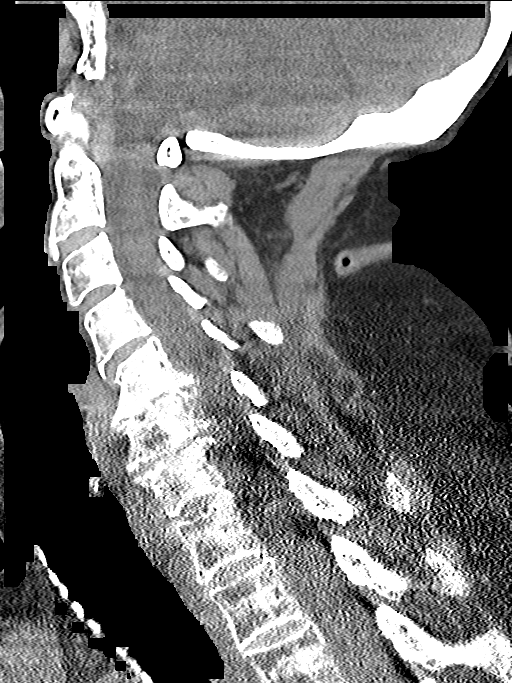
[im 31/61  bone]
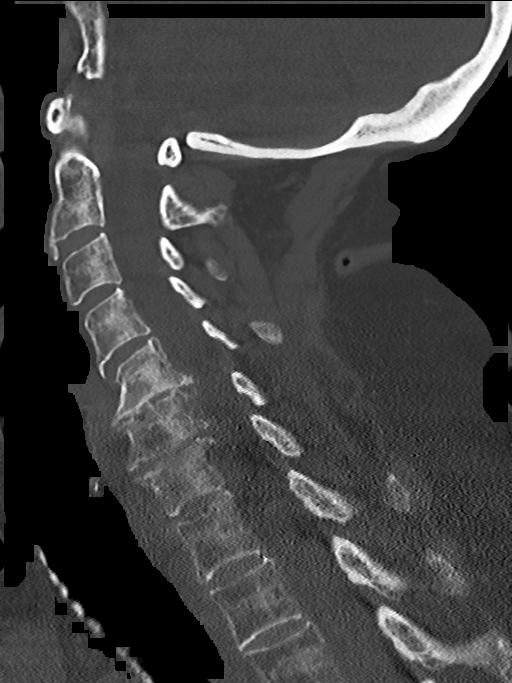
[im 36/61  bone]
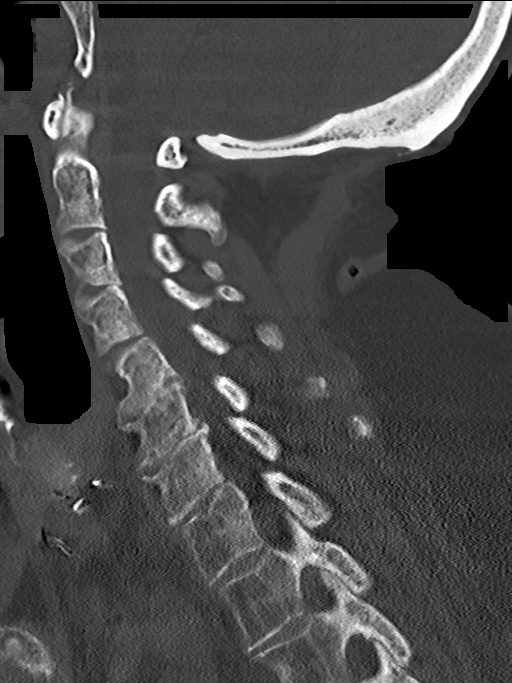
[im 41/61  bone]
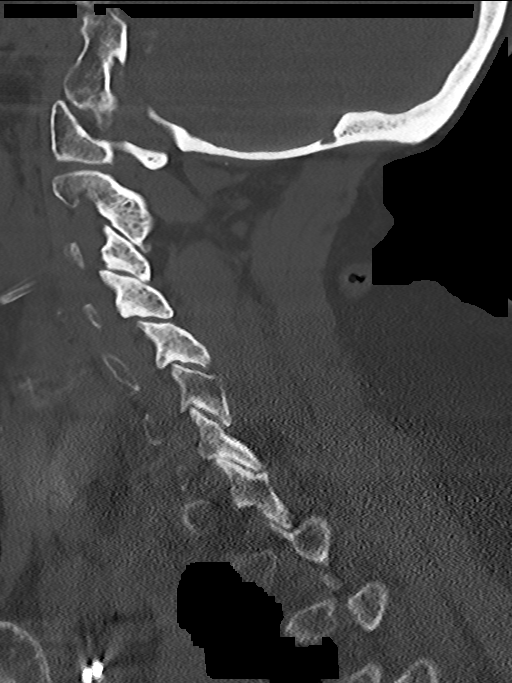

[Series 9: c_spine 2.0 cor bone · coronal · 0.20mm/px · 3 of 61 slices shown]
[im 13/61  bone]
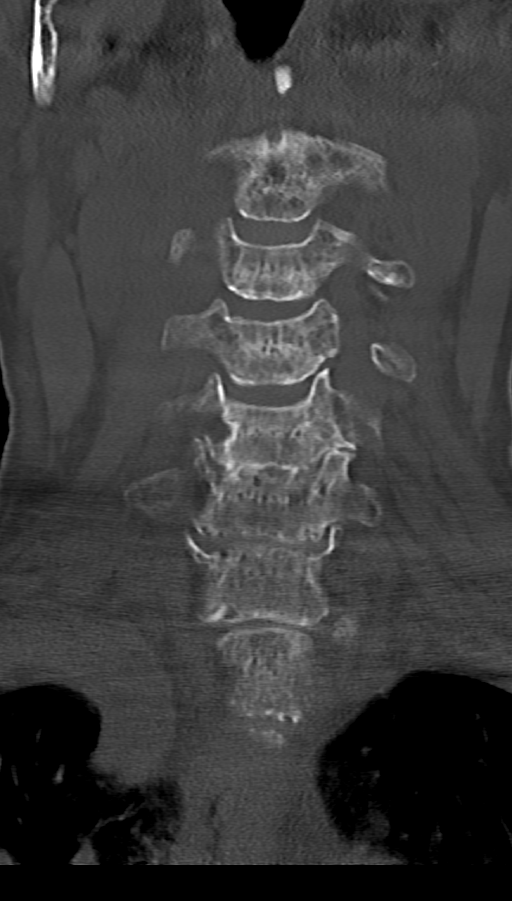
[im 25/61  bone]
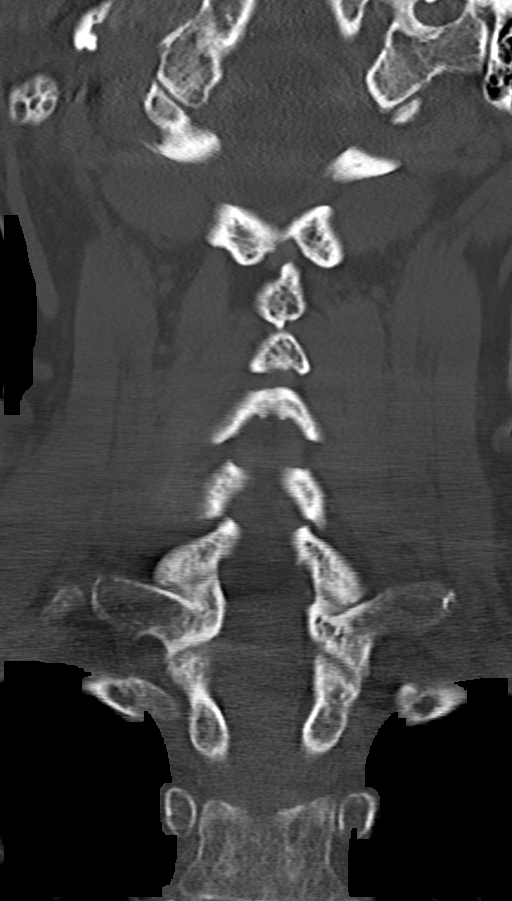
[im 37/61  bone]
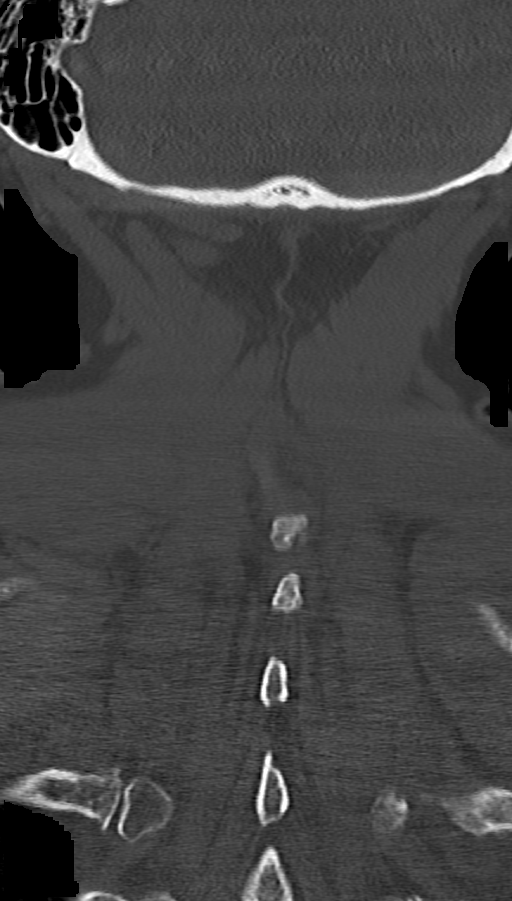

[11 of 33 positions shown; findings below may reference images not displayed]

FINDINGS: CT HEAD FINDINGS

Brain: No evidence of acute infarction, hemorrhage, hydrocephalus,
extra-axial collection, visible mass lesion or mass effect.
Symmetric prominence of the ventricles, cisterns and sulci
compatible with parenchymal volume loss. Mixed patchy and confluent
areas of white matter hypoattenuation are most compatible with
chronic microvascular angiopathy.

Vascular: Atherosclerotic calcification of the carotid siphons and
intradural vertebral arteries. No hyperdense vessel.

Skull: No calvarial fracture or suspicious osseous lesion. No scalp
swelling or hematoma.

Sinuses/Orbits: Paranasal sinuses and mastoid air cells are
predominantly clear. Orbital structures are unremarkable aside from
prior lens extractions.

Other: Bilateral TMJ arthrosis, right greater than left.

CT CERVICAL SPINE FINDINGS

Alignment: Stabilization collar in place at the time of exam.
Straightening of the normal cervical lordosis. Focal reversal noted
at the C5-6 level. No evidence of traumatic listhesis. No abnormally
widened, perched or jumped facets. Normal alignment of the
craniocervical and atlantoaxial articulations accounting for cranial
positioning and atlantodental arthrosis.

Skull base and vertebrae: No acute skull base fracture. No vertebral
body fracture or height loss. The osseous structures appear
diffusely demineralized which may limit detection of small or
nondisplaced fractures. Stable appearance of slightly more focal
lucency in the C3 and T1 posterosuperior corner, likely vertebral
body hemangiomata or demineralization without concerning features on
comparison MR imaging. Moderate arthrosis at the atlantodental and
basion dens intervals, stable prior. Multilevel cervical spondylitic
changes, as detailed below.

Soft tissues and spinal canal: No pre or paravertebral fluid or
swelling. No visible canal hematoma. Airways patent.

Disc levels: Multilevel intervertebral disc height loss with
spondylitic endplate changes. Features maximal C5-C7 with larger
disc osteophyte complexes at the C5-6, C6-7 levels resulting and
moderate canal stenoses. Additional uncinate spurring facet
hypertrophic changes throughout the cervical spine result in mild
multilevel neural foraminal narrowing.

Upper chest: Biapical pleuroparenchymal scarring is noted.
Postsurgical changes at the base of the neck likely reflecting prior
thyroidectomy. Calcification nodes.

Other: None.
IMPRESSION: 1. No acute intracranial abnormality. No scalp swelling or calvarial
fracture.
2. Stable parenchymal volume loss and chronic microvascular ischemic
white matter disease.
3. No acute cervical spine fracture or traumatic listhesis.
4. Multilevel cervical spondylitic and facet degenerative changes,
maximal C5-C7 where there is moderate canal stenoses and mild
multilevel neural foraminal narrowing.
5. Stable appearance of slightly more focal lucency in the C3 and T1
posterosuperior corner, likely vertebral body hemangiomata or
demineralization without concerning features on interval comparison
MR imaging.

## 2022-07-18 IMAGING — CR DG HIP (WITH OR WITHOUT PELVIS) 2-3V*L*
3 series · 3 of 3 positions shown · non-contrast
Comparison: None.

CLINICAL DATA: Left hip and elbow pain due to fall

EXAM:
DG HIP (WITH OR WITHOUT PELVIS) 2-3V LEFT

[hip ap]
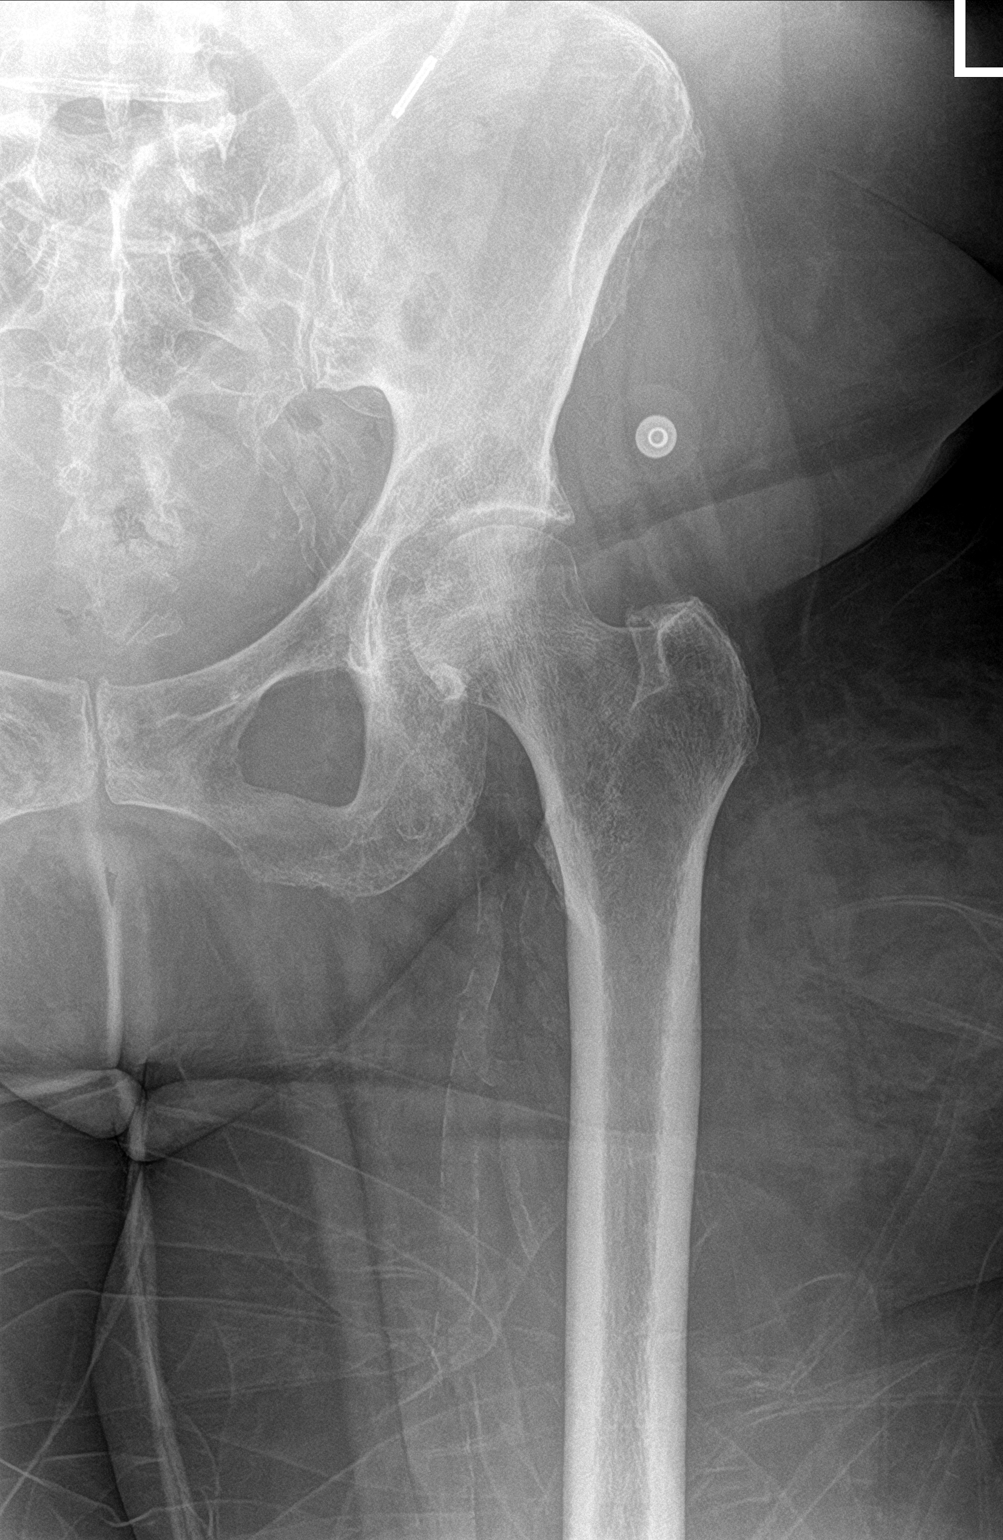

[hip lat]
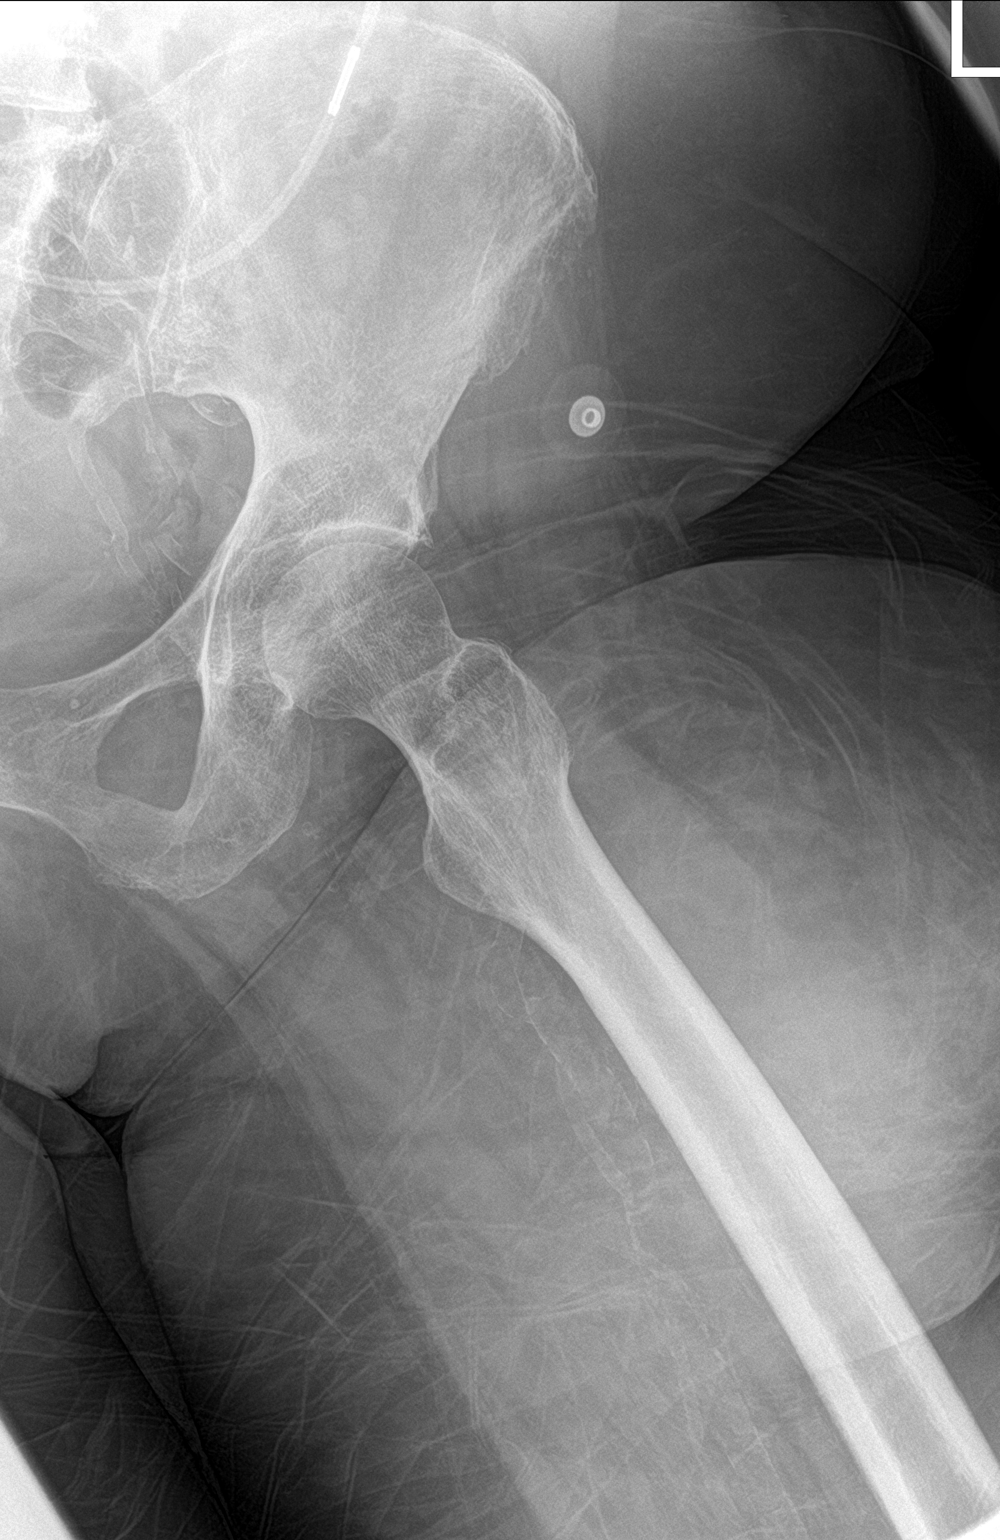

[pelvis ap]
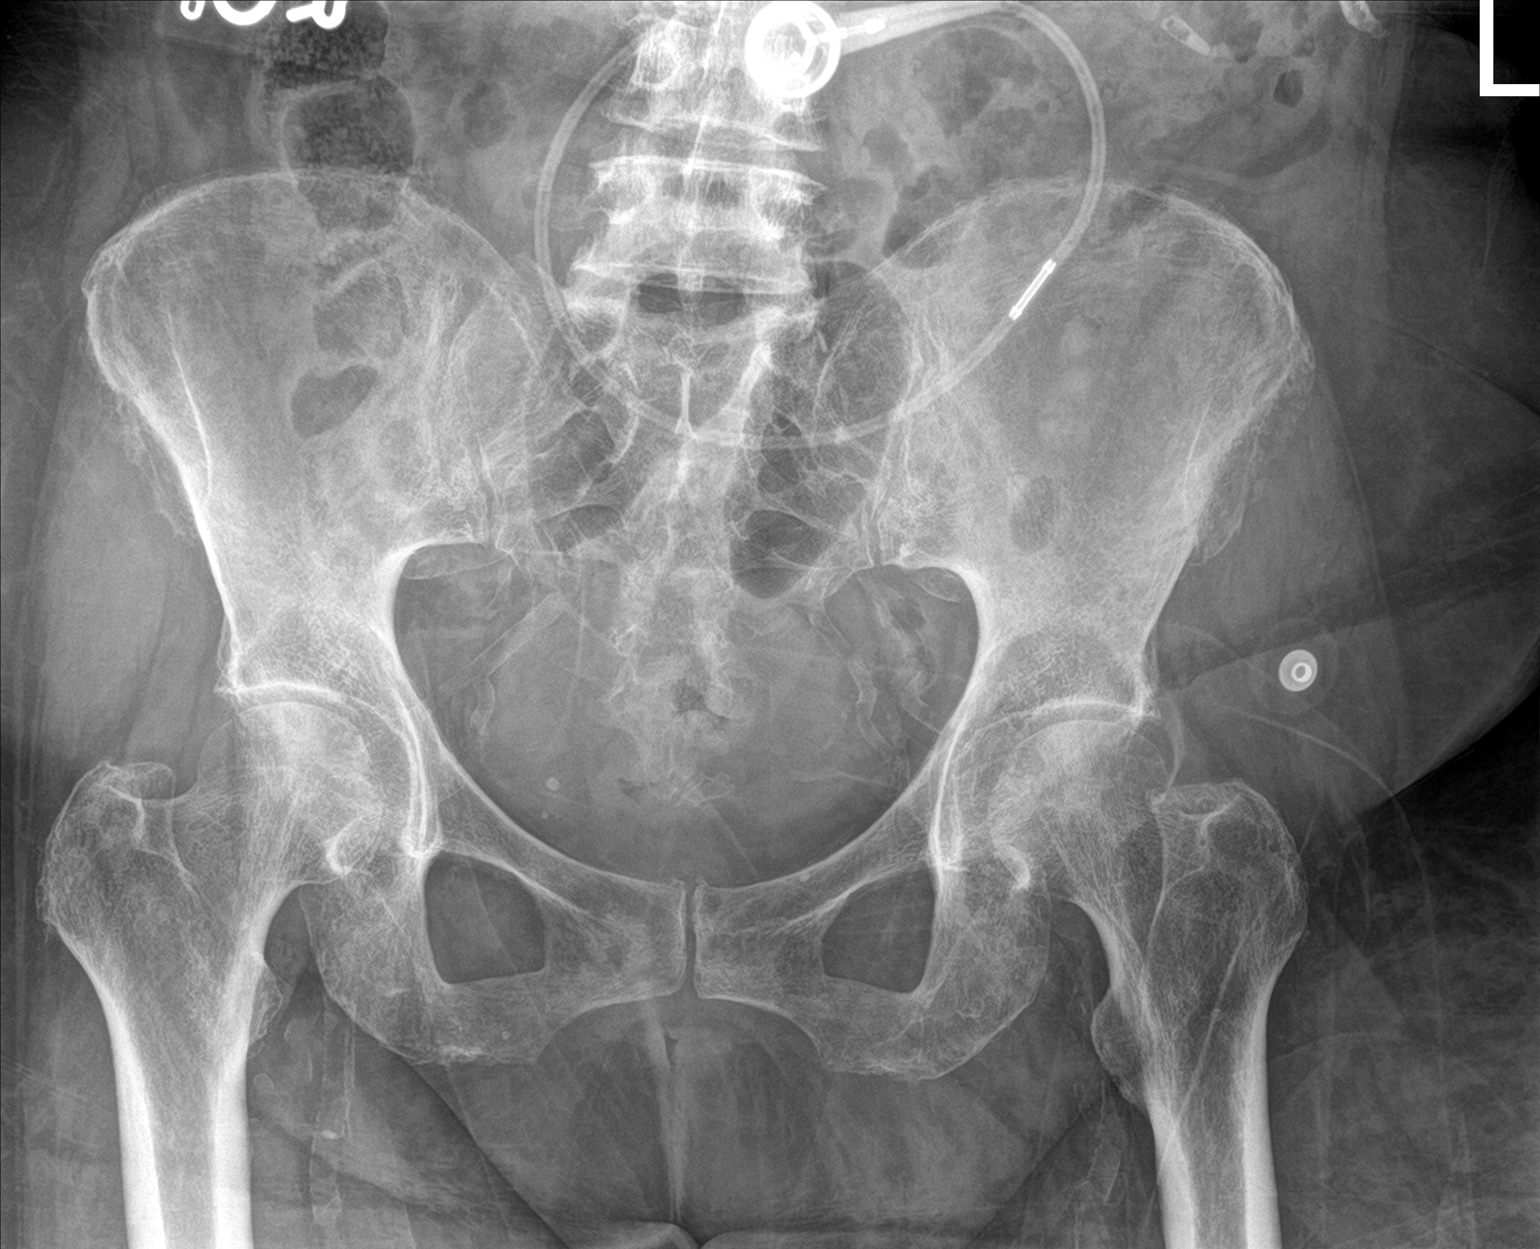

[3 of 3 positions shown; findings below may reference images not displayed]

FINDINGS: Lateral left hip swelling and/or hematoma.

The osseous structures appear diffusely demineralized which may
limit detection of small or nondisplaced fractures. No acute bony
abnormality. Specifically, no fracture, subluxation, or dislocation.
Degenerative changes present in the lower lumbar spine, bilateral
hips and pelvis.

Extensive atherosclerotic calcifications throughout the abdomen
pelvis. Catheter tubing projects over the mid abdomen.
IMPRESSION: Lateral left hip swelling and/or hematoma. No acute osseous
abnormality.

Degenerative changes in the spine, hips and pelvis.

## 2022-07-18 IMAGING — CT CT HEAD W/O CM
4 series · 15 of 47 positions shown, 17 images · non-contrast
Comparison: MR head and cervical spine 07/29/2018, CT head and
cervical spine 12/02/2018

CLINICAL DATA: Fall, on anticoagulation

EXAM:
CT HEAD WITHOUT CONTRAST
CT CERVICAL SPINE WITHOUT CONTRAST
TECHNIQUE: Multidetector CT imaging of the head and cervical spine was
performed following the standard protocol without intravenous
contrast. Multiplanar CT image reconstructions of the cervical spine
were also generated.

[Series 1: head without · axial · non-contrast · 0.41mm/px · z∈[-126,-6]mm · 6 of 34 slices shown, 8 images]
[im 5/34  brain]
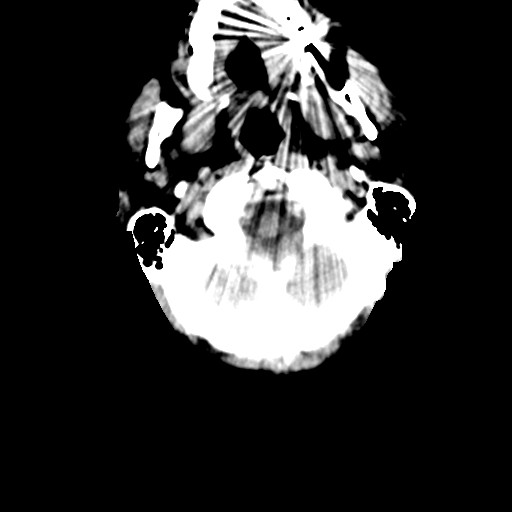
[im 5/34  bone]
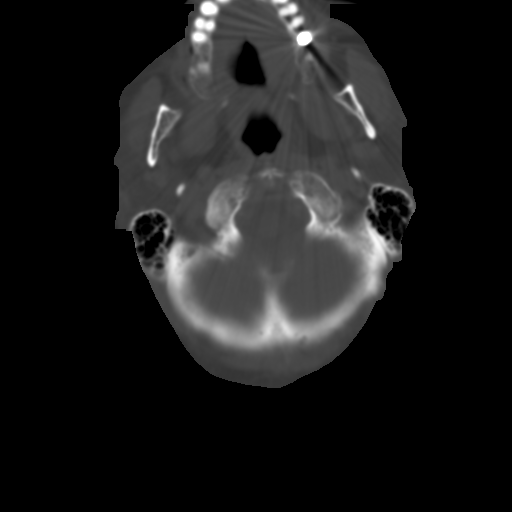
[im 10/34  brain]
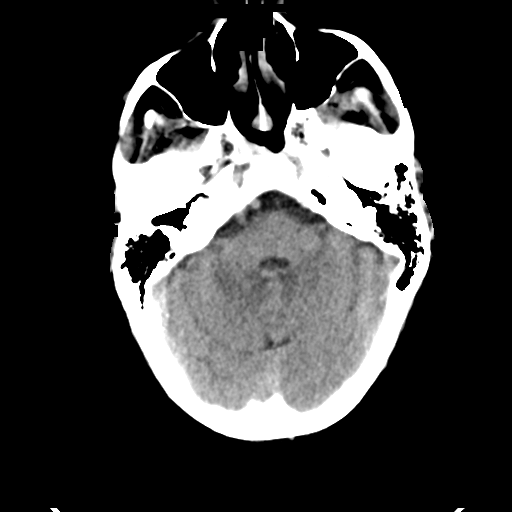
[im 15/34  brain]
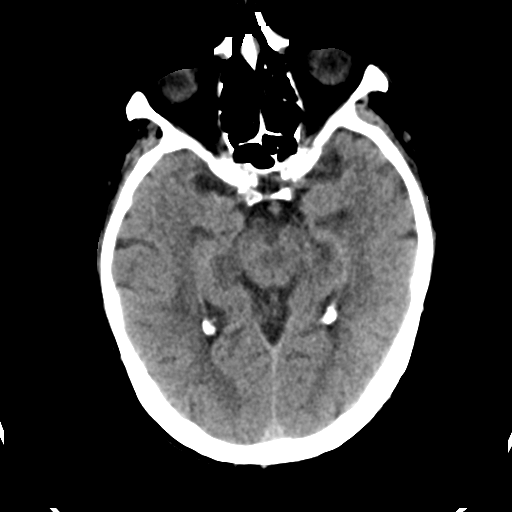
[im 19/34  brain]
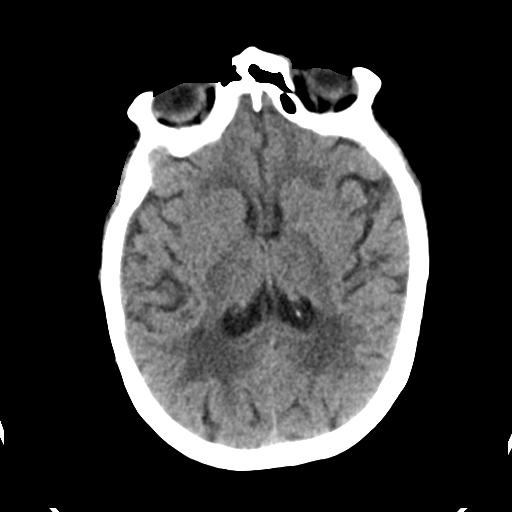
[im 24/34  brain]
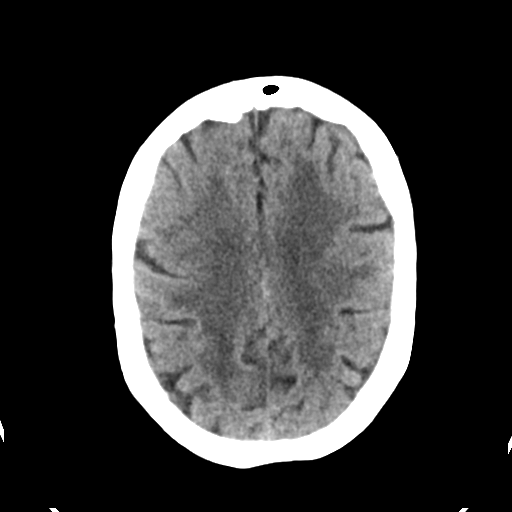
[im 24/34  bone]
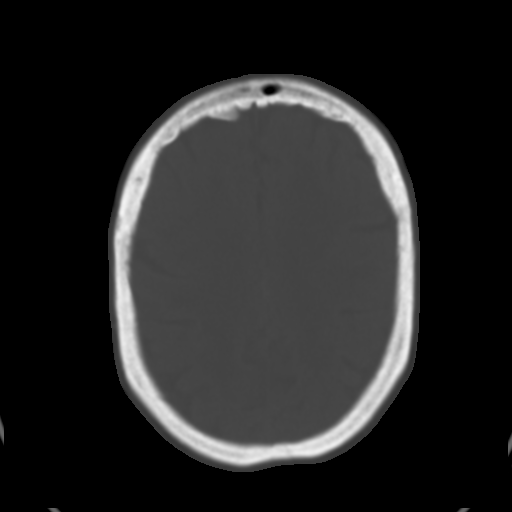
[im 29/34  brain]
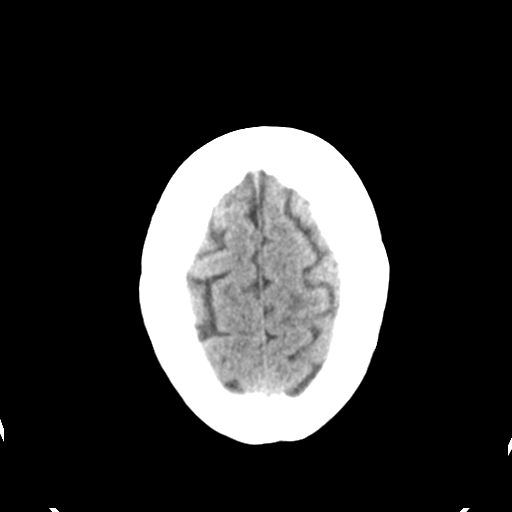

[Series 4: head bone · axial · 0.41mm/px · z∈[-130,-88]mm · 3 of 88 slices shown]
[im 9/88  bone]
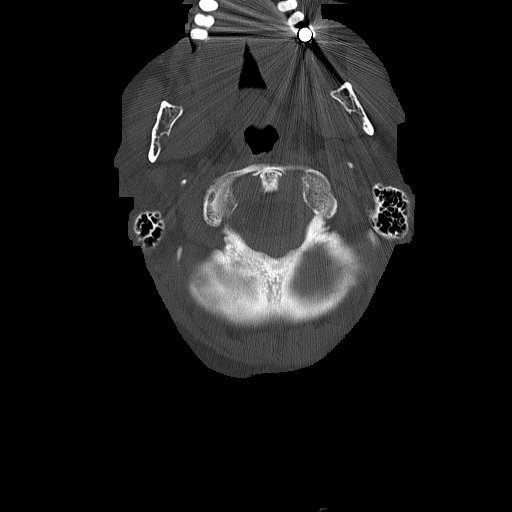
[im 17/88  bone]
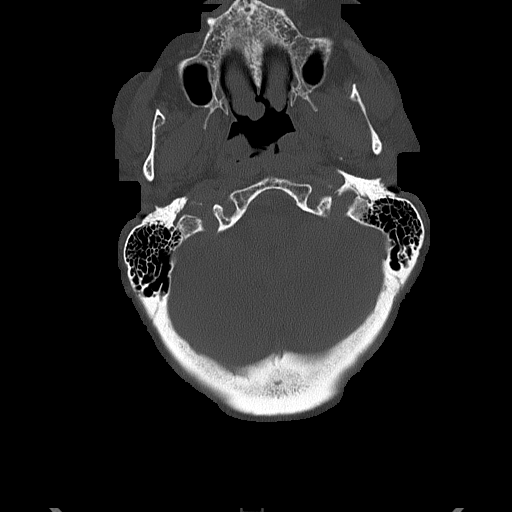
[im 30/88  bone]
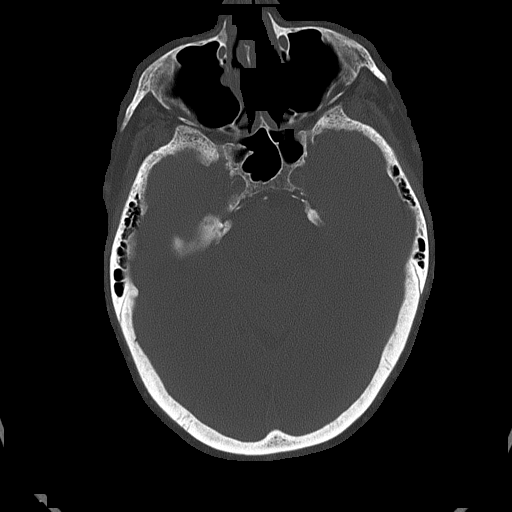

[Series 5: head without cor · coronal · non-contrast · 0.32mm/px · 3 of 67 slices shown]
[im 23/67  brain]
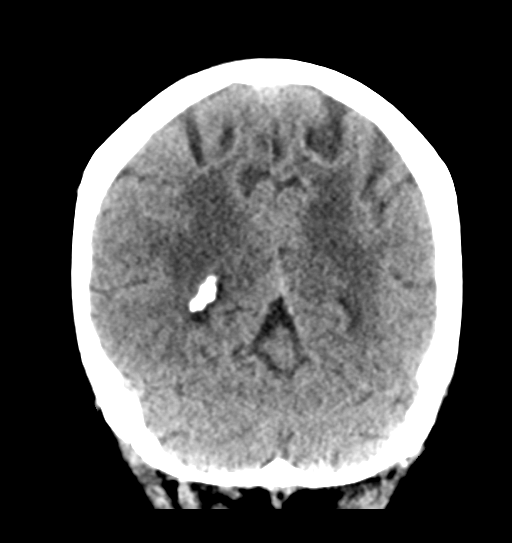
[im 30/67  brain]
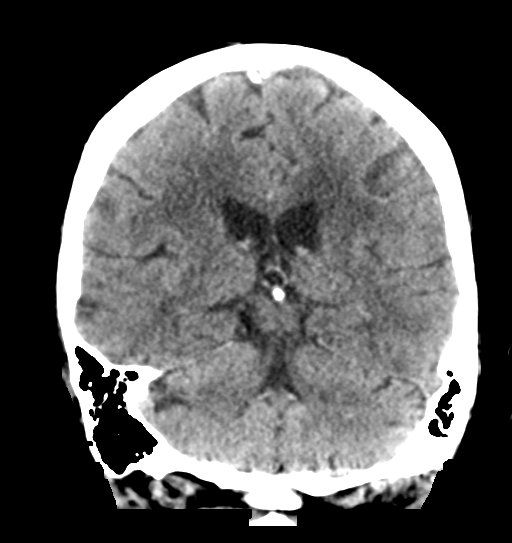
[im 37/67  brain]
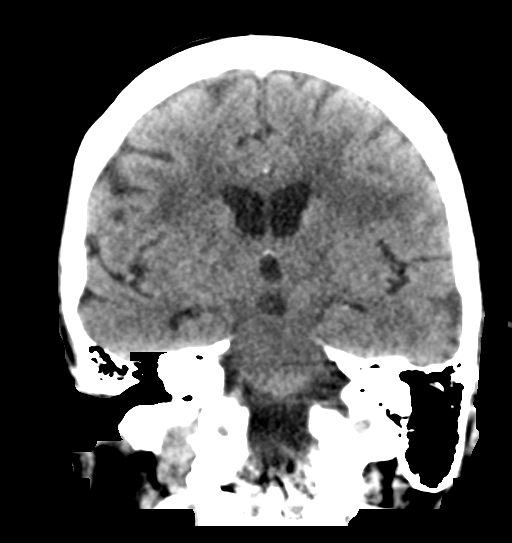

[Series 6: head without sag · sagittal · non-contrast · 0.34mm/px · 3 of 51 slices shown]
[im 17/51  brain]
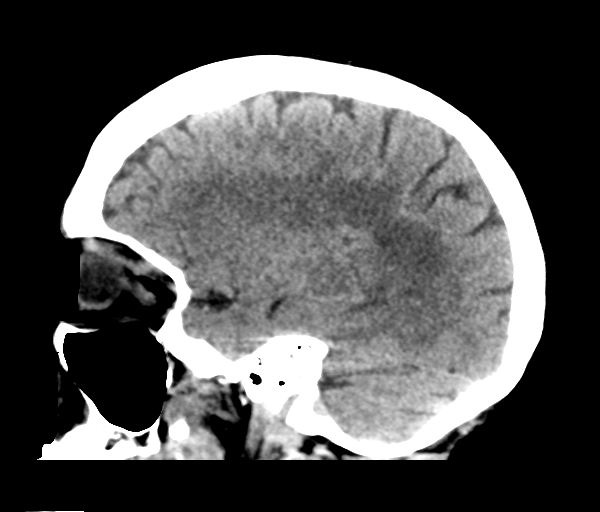
[im 26/51  brain]
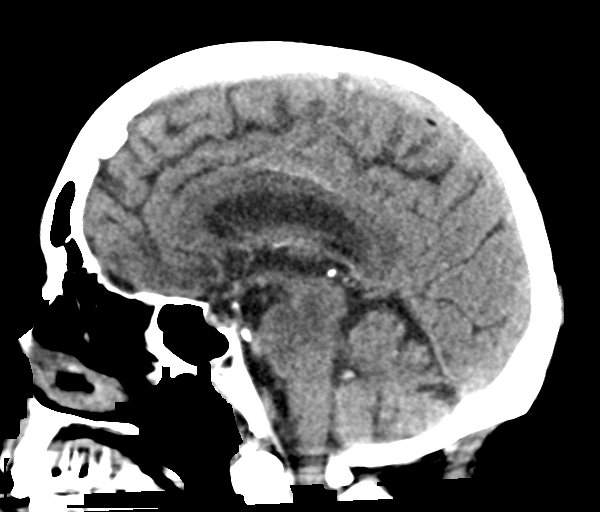
[im 34/51  brain]
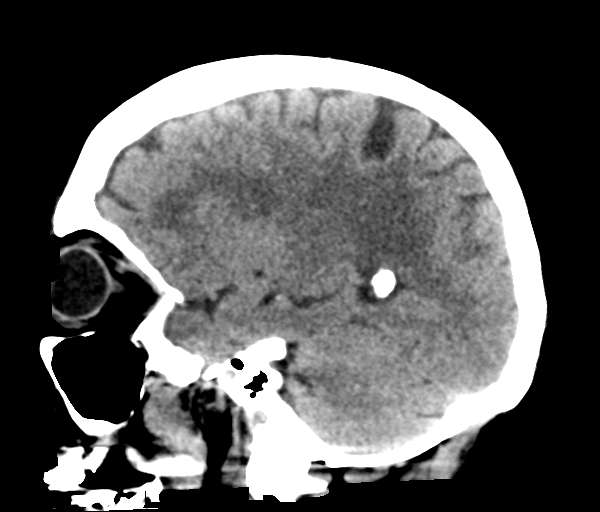

[15 of 47 positions shown; findings below may reference images not displayed]

FINDINGS: CT HEAD FINDINGS

Brain: No evidence of acute infarction, hemorrhage, hydrocephalus,
extra-axial collection, visible mass lesion or mass effect.
Symmetric prominence of the ventricles, cisterns and sulci
compatible with parenchymal volume loss. Mixed patchy and confluent
areas of white matter hypoattenuation are most compatible with
chronic microvascular angiopathy.

Vascular: Atherosclerotic calcification of the carotid siphons and
intradural vertebral arteries. No hyperdense vessel.

Skull: No calvarial fracture or suspicious osseous lesion. No scalp
swelling or hematoma.

Sinuses/Orbits: Paranasal sinuses and mastoid air cells are
predominantly clear. Orbital structures are unremarkable aside from
prior lens extractions.

Other: Bilateral TMJ arthrosis, right greater than left.

CT CERVICAL SPINE FINDINGS

Alignment: Stabilization collar in place at the time of exam.
Straightening of the normal cervical lordosis. Focal reversal noted
at the C5-6 level. No evidence of traumatic listhesis. No abnormally
widened, perched or jumped facets. Normal alignment of the
craniocervical and atlantoaxial articulations accounting for cranial
positioning and atlantodental arthrosis.

Skull base and vertebrae: No acute skull base fracture. No vertebral
body fracture or height loss. The osseous structures appear
diffusely demineralized which may limit detection of small or
nondisplaced fractures. Stable appearance of slightly more focal
lucency in the C3 and T1 posterosuperior corner, likely vertebral
body hemangiomata or demineralization without concerning features on
comparison MR imaging. Moderate arthrosis at the atlantodental and
basion dens intervals, stable prior. Multilevel cervical spondylitic
changes, as detailed below.

Soft tissues and spinal canal: No pre or paravertebral fluid or
swelling. No visible canal hematoma. Airways patent.

Disc levels: Multilevel intervertebral disc height loss with
spondylitic endplate changes. Features maximal C5-C7 with larger
disc osteophyte complexes at the C5-6, C6-7 levels resulting and
moderate canal stenoses. Additional uncinate spurring facet
hypertrophic changes throughout the cervical spine result in mild
multilevel neural foraminal narrowing.

Upper chest: Biapical pleuroparenchymal scarring is noted.
Postsurgical changes at the base of the neck likely reflecting prior
thyroidectomy. Calcification nodes.

Other: None.
IMPRESSION: 1. No acute intracranial abnormality. No scalp swelling or calvarial
fracture.
2. Stable parenchymal volume loss and chronic microvascular ischemic
white matter disease.
3. No acute cervical spine fracture or traumatic listhesis.
4. Multilevel cervical spondylitic and facet degenerative changes,
maximal C5-C7 where there is moderate canal stenoses and mild
multilevel neural foraminal narrowing.
5. Stable appearance of slightly more focal lucency in the C3 and T1
posterosuperior corner, likely vertebral body hemangiomata or
demineralization without concerning features on interval comparison
MR imaging.

## 2022-07-18 IMAGING — CR DG RIBS W/ CHEST 3+V*R*
4 series · 4 of 4 positions shown · non-contrast
Comparison: 10/12/2020

CLINICAL DATA: Fall.

EXAM:
RIGHT RIBS AND CHEST - 3+ VIEW

[chest ap]
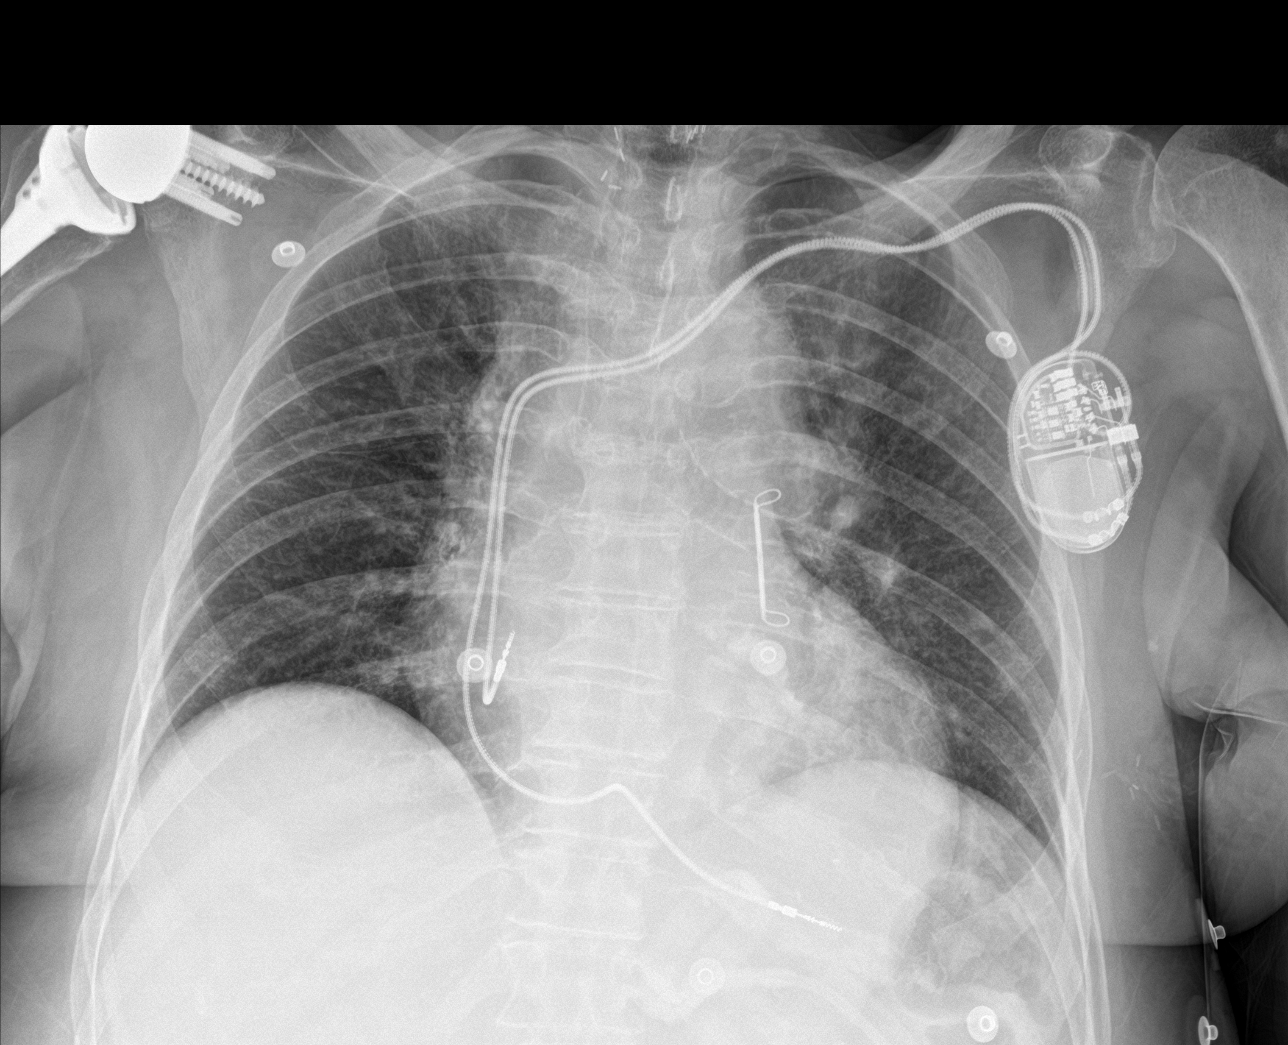

[rib ap]
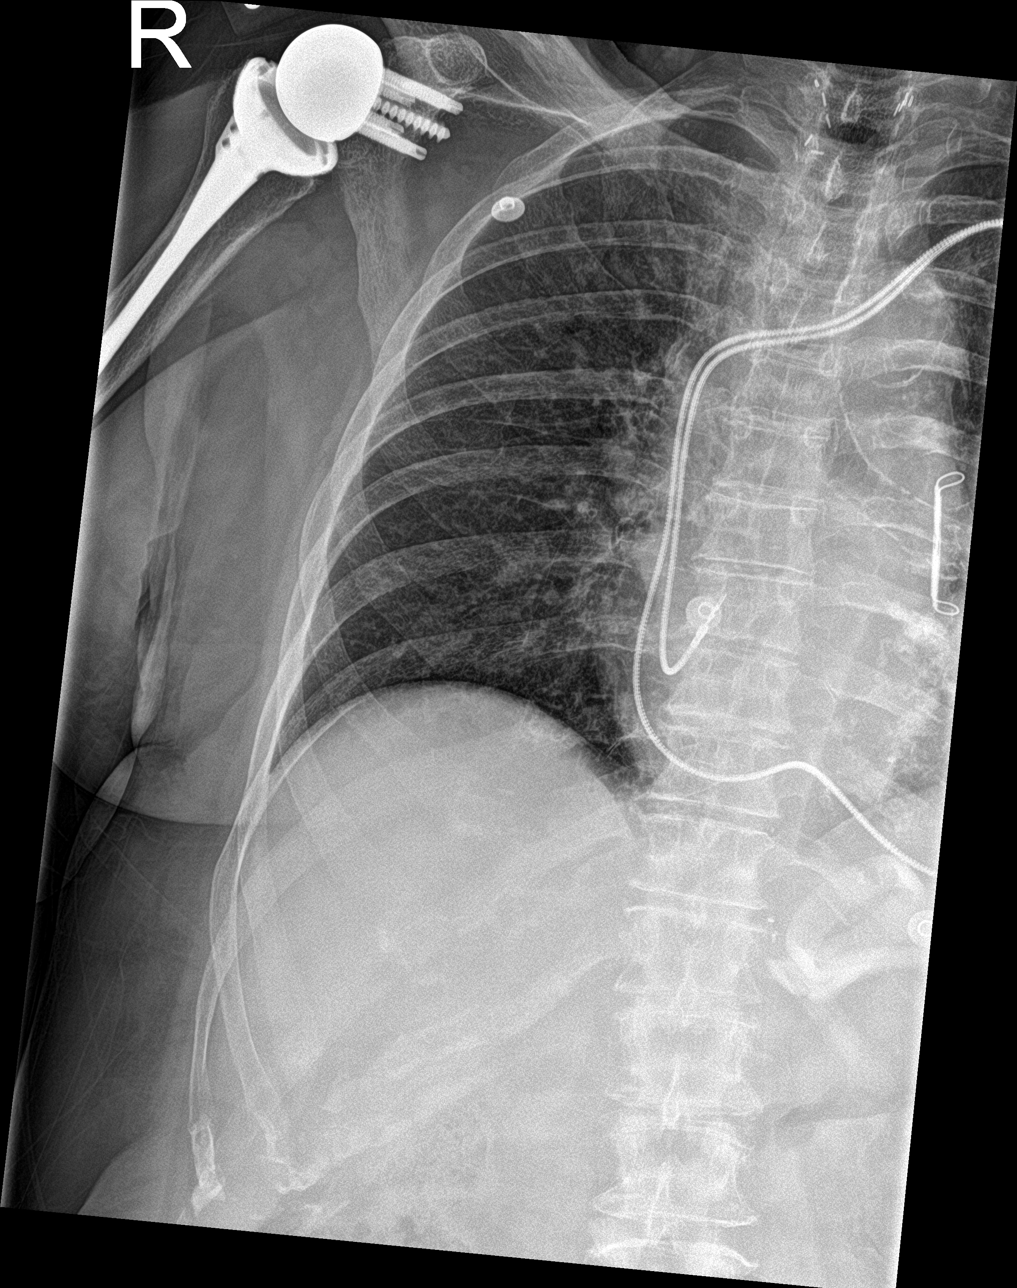

[rib ap obl (1 of 2)]
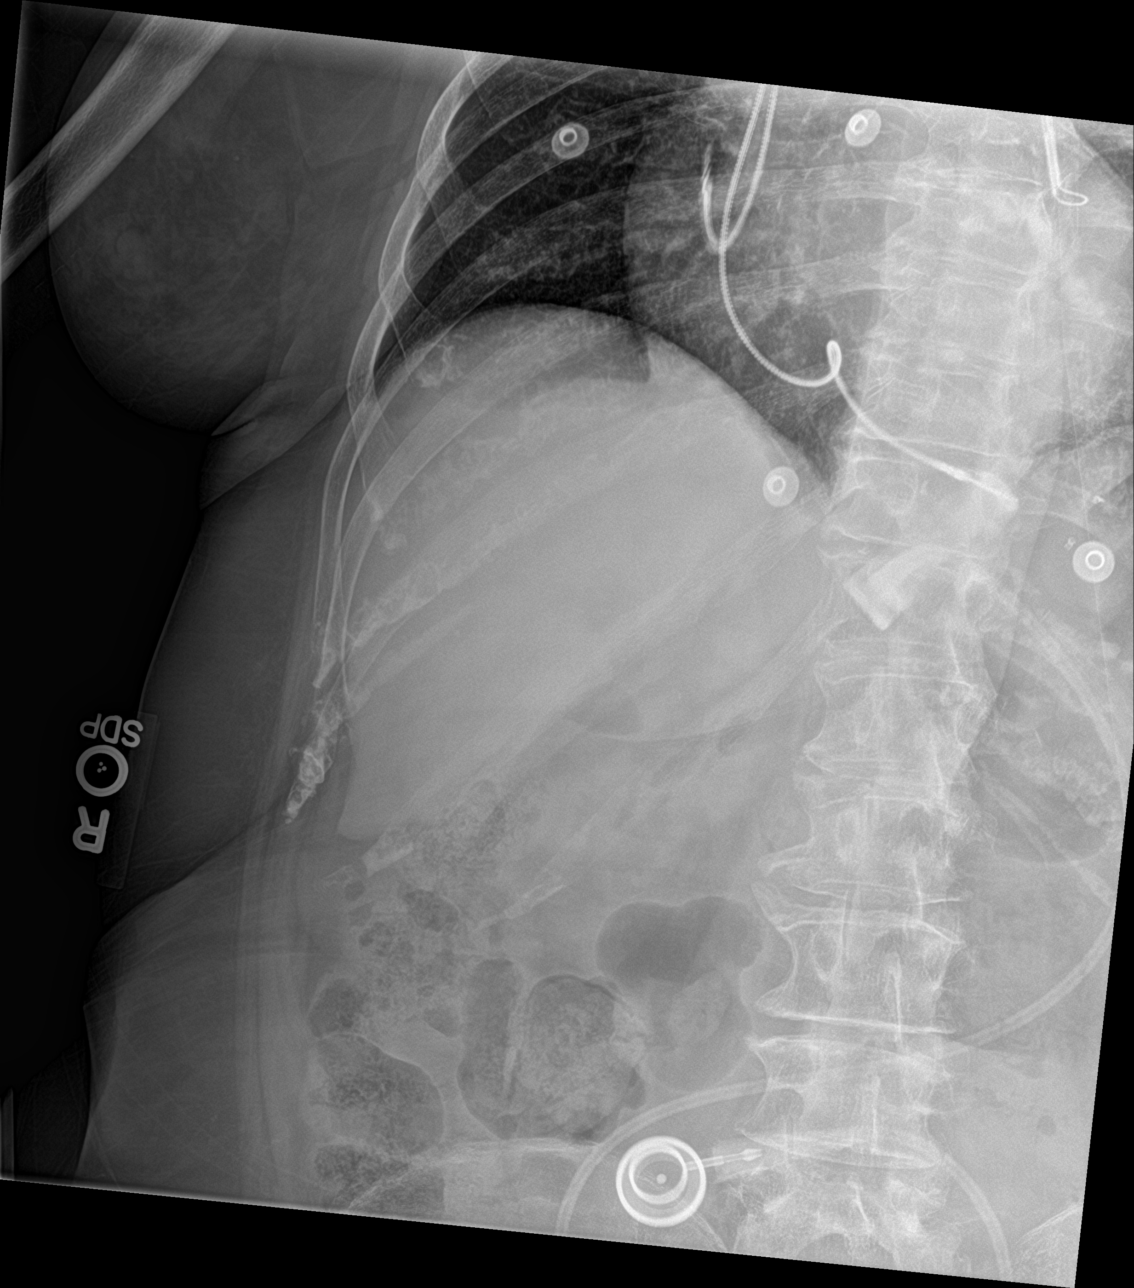

[rib ap obl (2 of 2)]
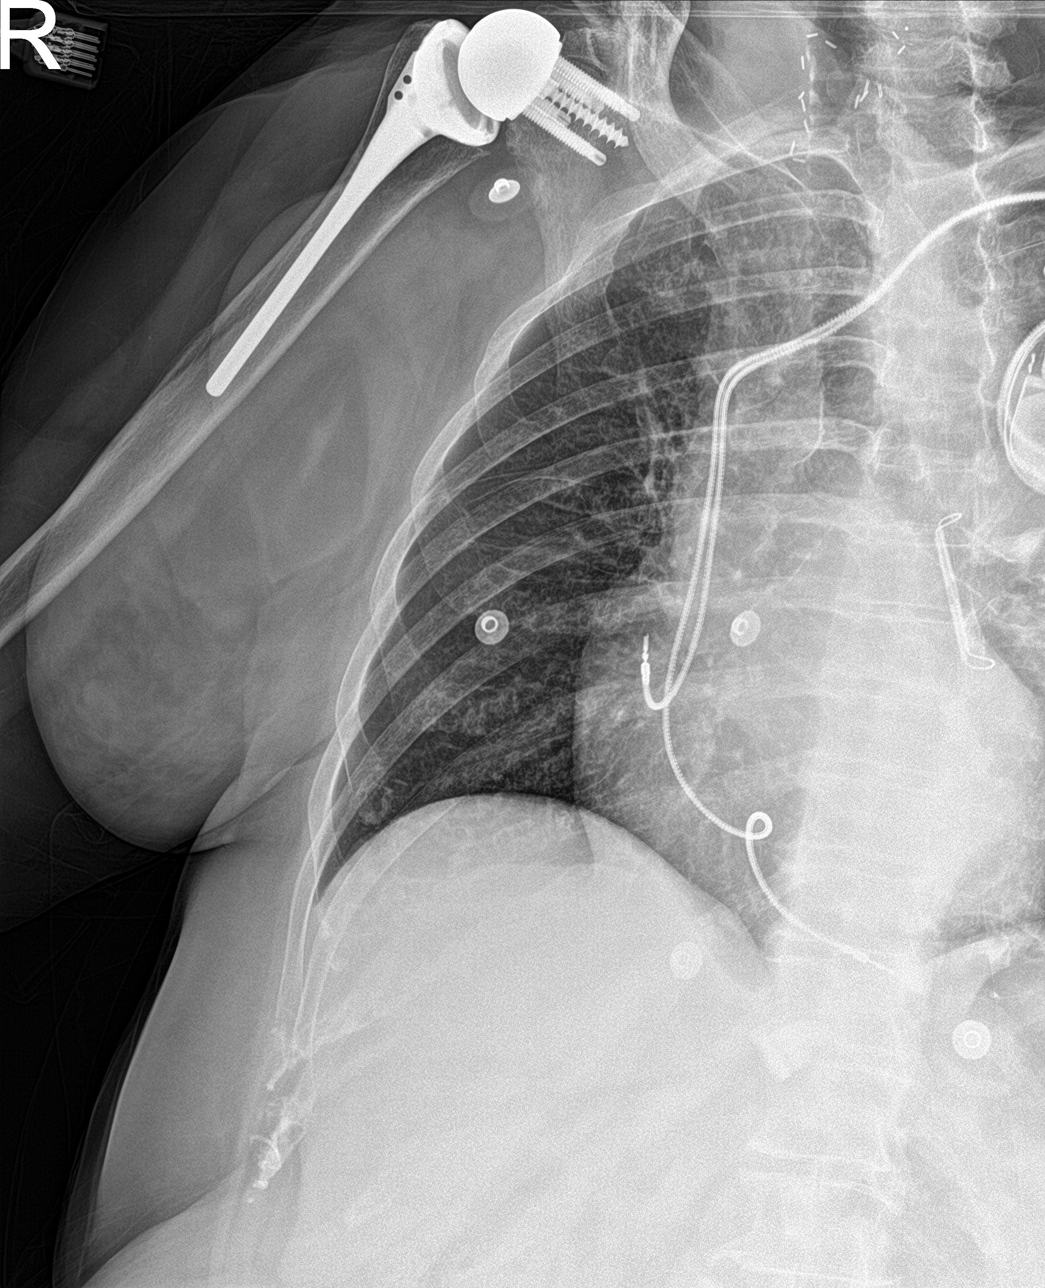

[4 of 4 positions shown; findings below may reference images not displayed]

FINDINGS: Cardiomegaly. Dual-chamber pacer leads in place. Left atrial
clipping.

Interstitial prominence which is stable. There is paramediastinal
scarring on a 0004 chest CT. No hemothorax or pneumothorax.

Lateral right seventh and eighth rib fractures which are
nondisplaced and marked on the images.

Reverse glenohumeral arthroplasty on the right. Lap band.
Postoperative thoracic inlet and left breast.
IMPRESSION: Nondisplaced right seventh and eighth rib fractures. No hemothorax
or pneumothorax.

## 2022-07-22 IMAGING — MR MR HIP*L* W/O CM
7 series · 37 of 40 positions shown · non-contrast
Comparison: CT and x-ray 12/15/2020

CLINICAL DATA: Fall, left hip pain with large subcutaneous hematoma

EXAM:
MR OF THE LEFT HIP WITHOUT CONTRAST
TECHNIQUE: Multiplanar, multisequence MR imaging was performed. No intravenous
contrast was administered.

[Series 8: T1 · coronal · left · 4.0mm · 1.17mm/px · 6 of 26 slices shown]
[im 1/26]
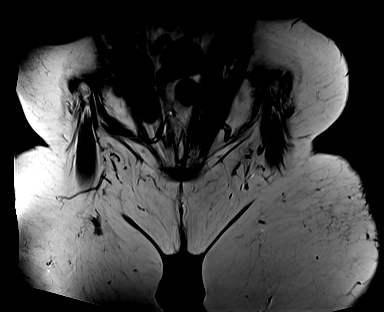
[im 6/26]
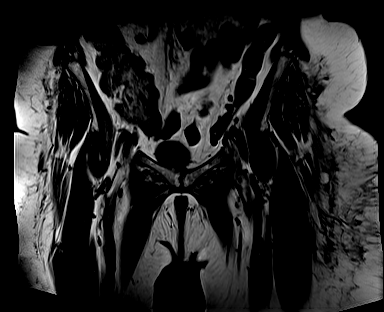
[im 11/26]
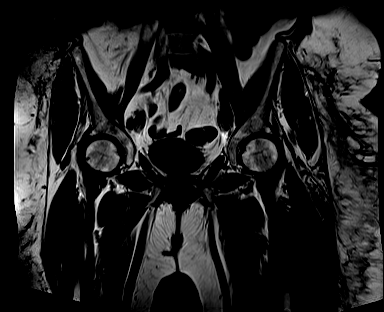
[im 16/26]
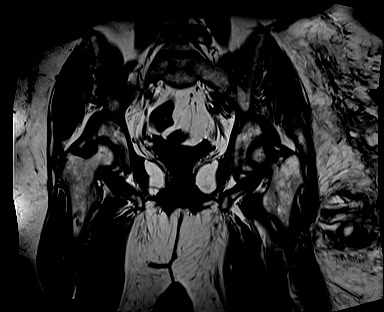
[im 21/26]
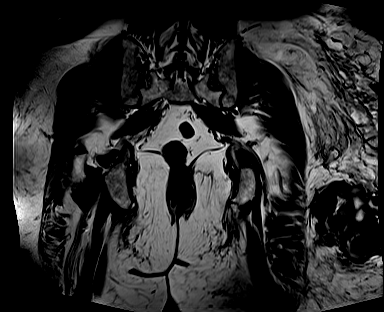
[im 26/26]
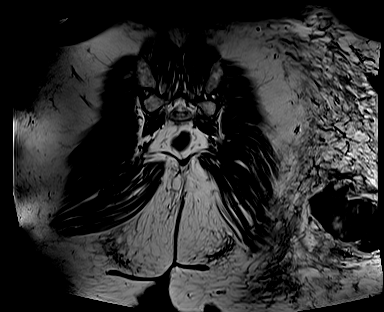

[Series 9: T2 fat-sat · coronal · left · 4.0mm · 1.00mm/px · 6 of 26 slices shown (1 of 2)]
[im 1/26]
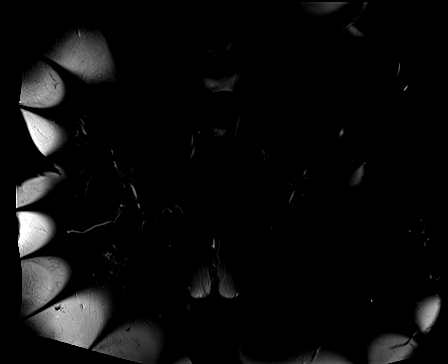
[im 6/26]
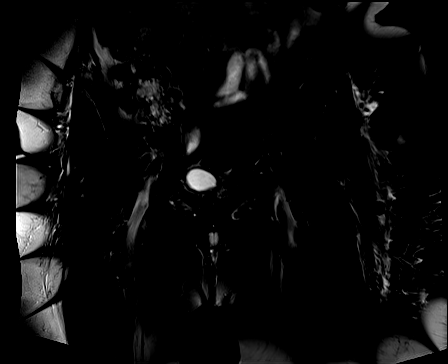
[im 11/26]
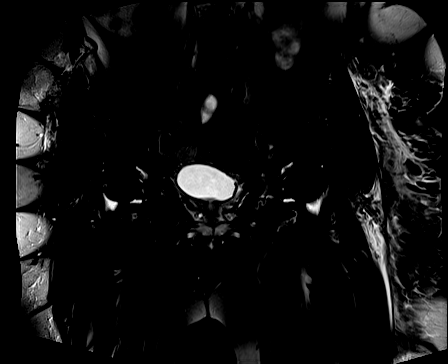
[im 16/26]
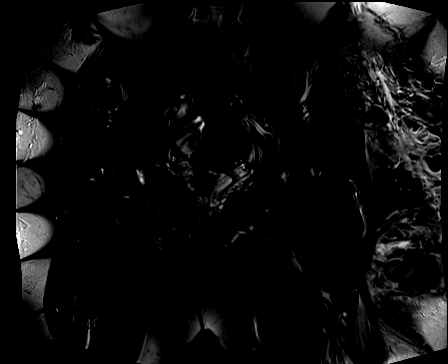
[im 21/26]
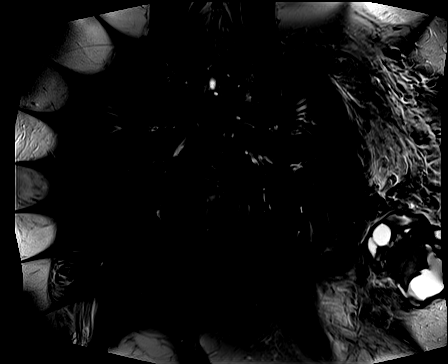
[im 26/26]
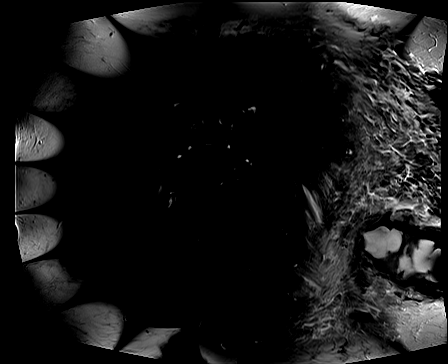

[Series 10: T2 fat-sat · axial · left · 4.0mm · 0.39mm/px · z∈[-37,+133]mm · 7 of 35 slices shown (2 of 2)]
[im 1/35]
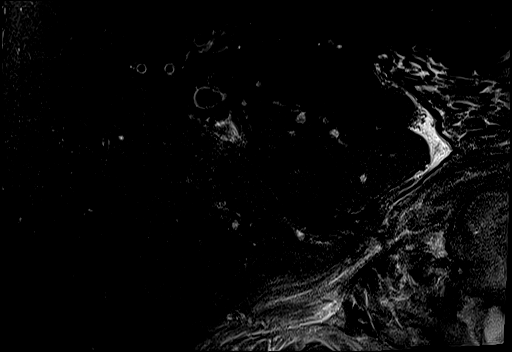
[im 6/35]
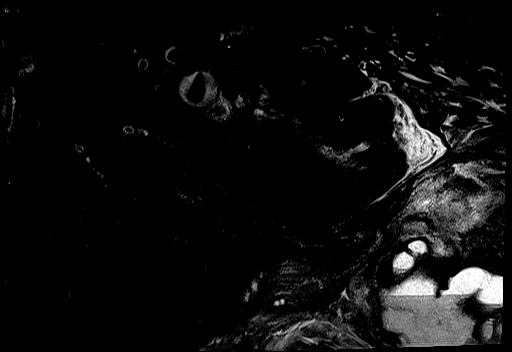
[im 12/35]
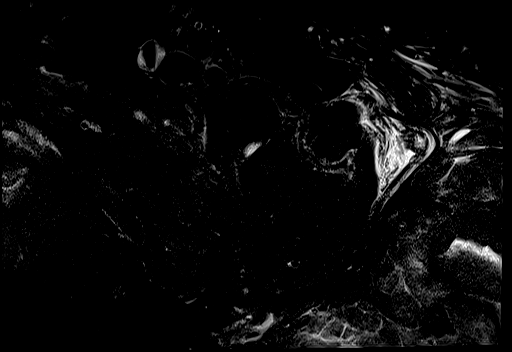
[im 18/35]
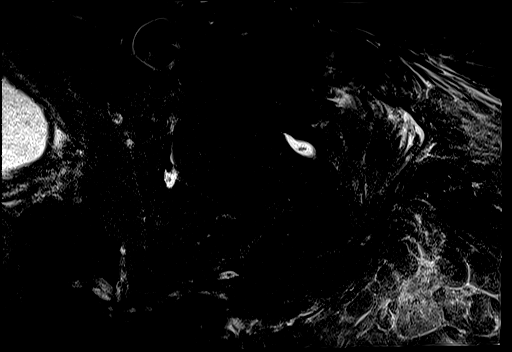
[im 23/35]
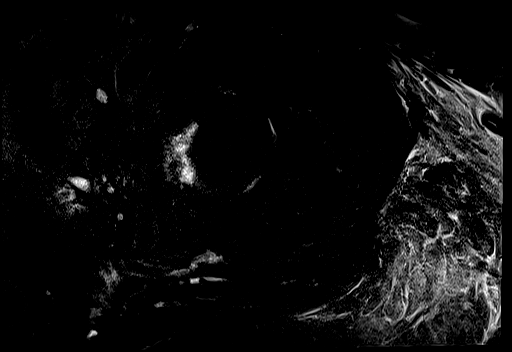
[im 29/35]
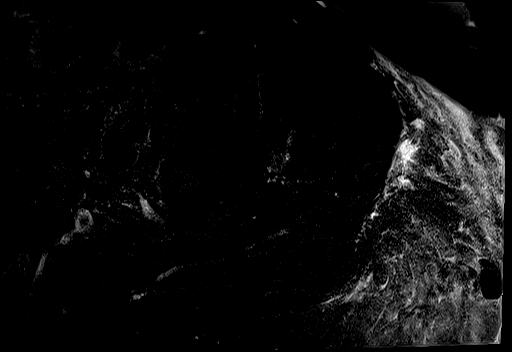
[im 35/35]
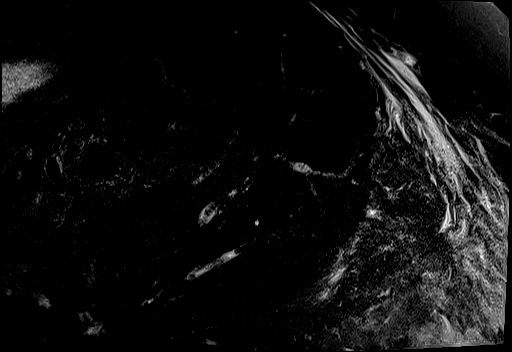

[Series 11: PD fat-sat · sagittal · left · 4.0mm · 0.56mm/px · 6 of 28 slices shown (1 of 2)]
[im 1/28]
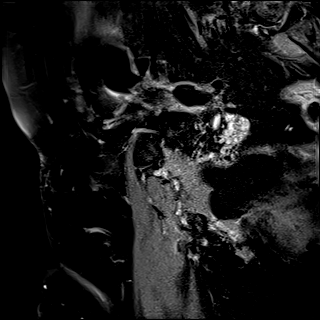
[im 6/28]
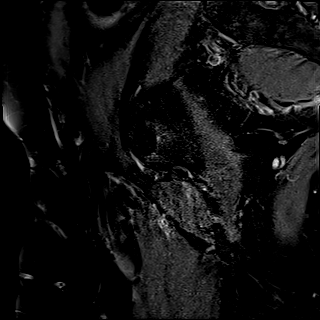
[im 11/28]
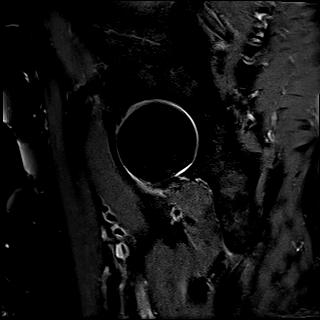
[im 17/28]
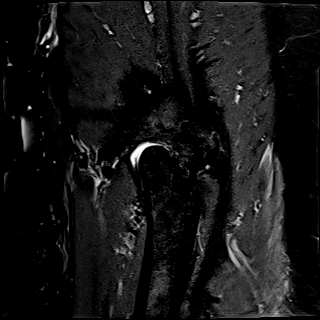
[im 22/28]
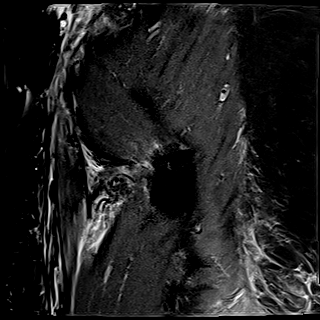
[im 28/28]
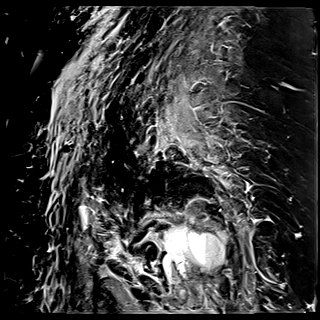

[Series 12: PD fat-sat · coronal · left · 3.0mm · 0.78mm/px · 4 of 20 slices shown (2 of 2)]
[im 1/20]
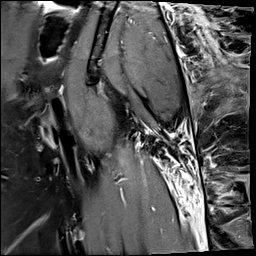
[im 7/20]
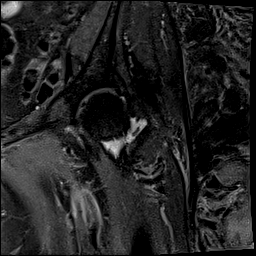
[im 13/20]
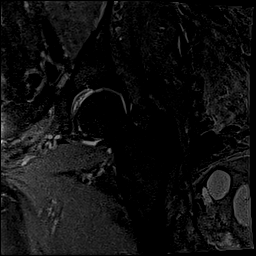
[im 20/20]
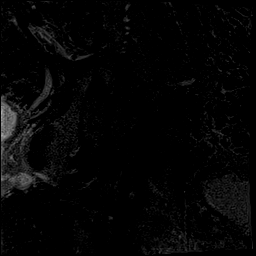

[Series 13: STIR · coronal · left · 4.0mm · 1.04mm/px · 5 of 26 slices shown]
[im 1/26]
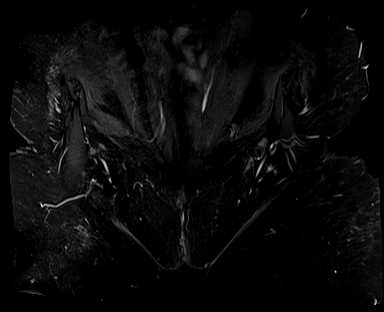
[im 7/26]
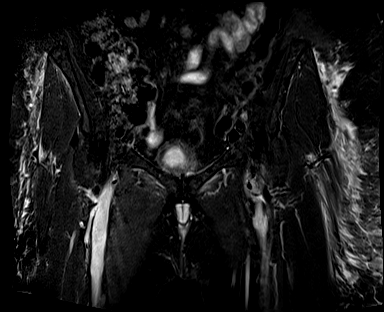
[im 13/26]
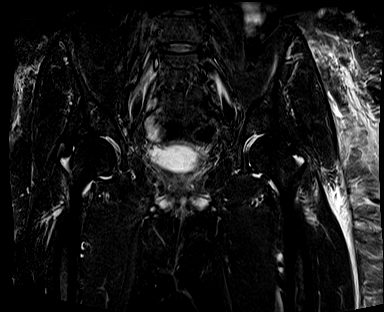
[im 19/26]
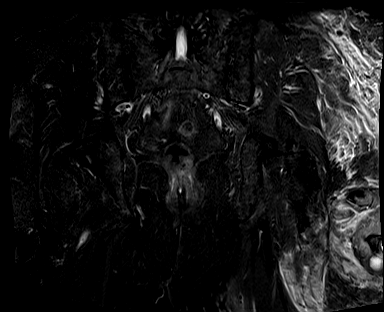
[im 26/26]
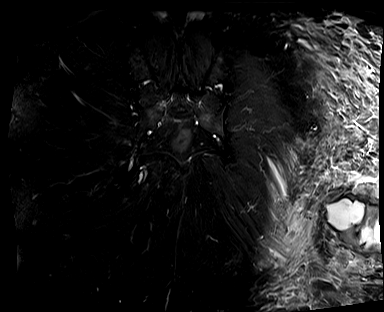

[Series 14: ax (id) · axial · left · 4.0mm · 0.70mm/px · z∈[-14,+36]mm · 3 of 30 slices shown]
[im 1/30]
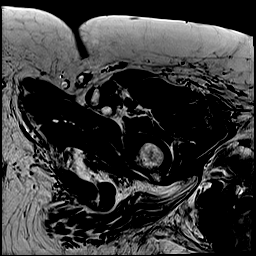
[im 6/30]
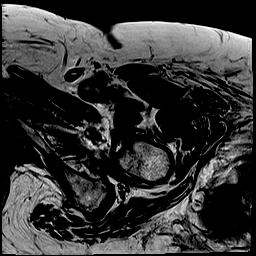
[im 12/30]
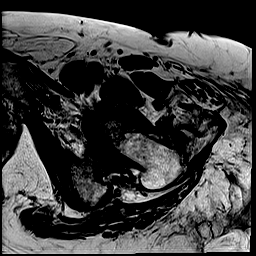

[37 of 40 positions shown; findings below may reference images not displayed]

FINDINGS: Bones: No acute fracture. No dislocation. No femoral head avascular
necrosis. No bone marrow edema. No marrow replacing bone lesion.

Articular cartilage and labrum

Articular cartilage: Mild chondral thinning of the bilateral hip
joints without focal cartilage defect. No subchondral marrow signal
changes.

Labrum: Partial-thickness tear of the anterosuperior labrum (series
11, images 9-10).

Joint or bursal effusion

Joint effusion:  Trace bilateral joint effusions, nonspecific.

Bursae: No abnormal bursal fluid collection.

Muscles and tendons

Muscles and tendons: Low-grade partial tears of the bilateral
hamstring tendon origins. Remaining tendinous structures about the
left hip are intact. Intramuscular edema within the distal left
gluteus maximus, likely posttraumatic.

Other findings

Miscellaneous: Large complex fluid collection within the
subcutaneous soft tissues overlying the posterolateral aspect of the
left hip with layering hematocrit levels and areas of T1
intermediate signal compatible with hematoma. The full extent of the
collection was not entirely included within the field of view but
measures at least 10 x 10 x 6 cm. Difficult to determine if the
collection has significantly changed in size from the prior CT as
the full extent of the collection was also not entirely included
within the field of view. Extensive surrounding soft tissue edema.
Colonic diverticulosis is noted.
IMPRESSION: 1. Large hematoma within the subcutaneous soft tissues overlying the
left hip measuring at least 10 cm in size. Difficult to determine if
the collection has significantly changed in size from the prior CT
as the full extent of the collection was also not entirely included
within the field of view on either study.
2. No acute osseous abnormality of the left hip.
3. Mild left hip arthropathy with partial-thickness tear of the
anterosuperior labrum.
4. Low-grade partial tears of the bilateral hamstring tendon
origins.

## 2022-07-23 DIAGNOSIS — D649 Anemia, unspecified: Secondary | ICD-10-CM | POA: Diagnosis not present

## 2022-07-23 DIAGNOSIS — I4819 Other persistent atrial fibrillation: Secondary | ICD-10-CM | POA: Diagnosis not present

## 2022-07-23 DIAGNOSIS — J90 Pleural effusion, not elsewhere classified: Secondary | ICD-10-CM | POA: Diagnosis not present

## 2022-07-23 DIAGNOSIS — I5030 Unspecified diastolic (congestive) heart failure: Secondary | ICD-10-CM | POA: Diagnosis not present

## 2022-07-23 DIAGNOSIS — I69818 Other symptoms and signs involving cognitive functions following other cerebrovascular disease: Secondary | ICD-10-CM | POA: Diagnosis not present

## 2022-07-23 DIAGNOSIS — I69891 Dysphagia following other cerebrovascular disease: Secondary | ICD-10-CM | POA: Diagnosis not present

## 2022-07-24 DIAGNOSIS — Z8572 Personal history of non-Hodgkin lymphomas: Secondary | ICD-10-CM | POA: Diagnosis not present

## 2022-07-24 DIAGNOSIS — F32A Depression, unspecified: Secondary | ICD-10-CM | POA: Diagnosis not present

## 2022-07-24 DIAGNOSIS — I69891 Dysphagia following other cerebrovascular disease: Secondary | ICD-10-CM | POA: Diagnosis not present

## 2022-07-24 DIAGNOSIS — R159 Full incontinence of feces: Secondary | ICD-10-CM | POA: Diagnosis not present

## 2022-07-24 DIAGNOSIS — N200 Calculus of kidney: Secondary | ICD-10-CM | POA: Diagnosis not present

## 2022-07-24 DIAGNOSIS — Z90710 Acquired absence of both cervix and uterus: Secondary | ICD-10-CM | POA: Diagnosis not present

## 2022-07-24 DIAGNOSIS — Z9981 Dependence on supplemental oxygen: Secondary | ICD-10-CM | POA: Diagnosis not present

## 2022-07-24 DIAGNOSIS — K635 Polyp of colon: Secondary | ICD-10-CM | POA: Diagnosis not present

## 2022-07-24 DIAGNOSIS — J90 Pleural effusion, not elsewhere classified: Secondary | ICD-10-CM | POA: Diagnosis not present

## 2022-07-24 DIAGNOSIS — K760 Fatty (change of) liver, not elsewhere classified: Secondary | ICD-10-CM | POA: Diagnosis not present

## 2022-07-24 DIAGNOSIS — Z9884 Bariatric surgery status: Secondary | ICD-10-CM | POA: Diagnosis not present

## 2022-07-24 DIAGNOSIS — I5030 Unspecified diastolic (congestive) heart failure: Secondary | ICD-10-CM | POA: Diagnosis not present

## 2022-07-24 DIAGNOSIS — Z853 Personal history of malignant neoplasm of breast: Secondary | ICD-10-CM | POA: Diagnosis not present

## 2022-07-24 DIAGNOSIS — Z8673 Personal history of transient ischemic attack (TIA), and cerebral infarction without residual deficits: Secondary | ICD-10-CM | POA: Diagnosis not present

## 2022-07-24 DIAGNOSIS — D649 Anemia, unspecified: Secondary | ICD-10-CM | POA: Diagnosis not present

## 2022-07-24 DIAGNOSIS — Z741 Need for assistance with personal care: Secondary | ICD-10-CM | POA: Diagnosis not present

## 2022-07-24 DIAGNOSIS — I4819 Other persistent atrial fibrillation: Secondary | ICD-10-CM | POA: Diagnosis not present

## 2022-07-24 DIAGNOSIS — R32 Unspecified urinary incontinence: Secondary | ICD-10-CM | POA: Diagnosis not present

## 2022-07-24 DIAGNOSIS — Z8639 Personal history of other endocrine, nutritional and metabolic disease: Secondary | ICD-10-CM | POA: Diagnosis not present

## 2022-07-24 DIAGNOSIS — Z6821 Body mass index (BMI) 21.0-21.9, adult: Secondary | ICD-10-CM | POA: Diagnosis not present

## 2022-07-24 DIAGNOSIS — J690 Pneumonitis due to inhalation of food and vomit: Secondary | ICD-10-CM | POA: Diagnosis not present

## 2022-07-24 DIAGNOSIS — N809 Endometriosis, unspecified: Secondary | ICD-10-CM | POA: Diagnosis not present

## 2022-07-24 DIAGNOSIS — M1990 Unspecified osteoarthritis, unspecified site: Secondary | ICD-10-CM | POA: Diagnosis not present

## 2022-07-24 DIAGNOSIS — Z85118 Personal history of other malignant neoplasm of bronchus and lung: Secondary | ICD-10-CM | POA: Diagnosis not present

## 2022-07-24 DIAGNOSIS — I69818 Other symptoms and signs involving cognitive functions following other cerebrovascular disease: Secondary | ICD-10-CM | POA: Diagnosis not present

## 2022-07-25 DIAGNOSIS — I69818 Other symptoms and signs involving cognitive functions following other cerebrovascular disease: Secondary | ICD-10-CM | POA: Diagnosis not present

## 2022-07-25 DIAGNOSIS — I5030 Unspecified diastolic (congestive) heart failure: Secondary | ICD-10-CM | POA: Diagnosis not present

## 2022-07-25 DIAGNOSIS — D649 Anemia, unspecified: Secondary | ICD-10-CM | POA: Diagnosis not present

## 2022-07-25 DIAGNOSIS — I4819 Other persistent atrial fibrillation: Secondary | ICD-10-CM | POA: Diagnosis not present

## 2022-07-25 DIAGNOSIS — J90 Pleural effusion, not elsewhere classified: Secondary | ICD-10-CM | POA: Diagnosis not present

## 2022-07-25 DIAGNOSIS — I69891 Dysphagia following other cerebrovascular disease: Secondary | ICD-10-CM | POA: Diagnosis not present

## 2022-07-29 IMAGING — CR DG FOOT COMPLETE 3+V*L*
3 series · 3 of 3 positions shown · non-contrast
Comparison: None.

CLINICAL DATA: 74-year-old female with pain over the base of the
fifth metatarsal.

EXAM:
LEFT FOOT - COMPLETE 3+ VIEW

[foot ap]
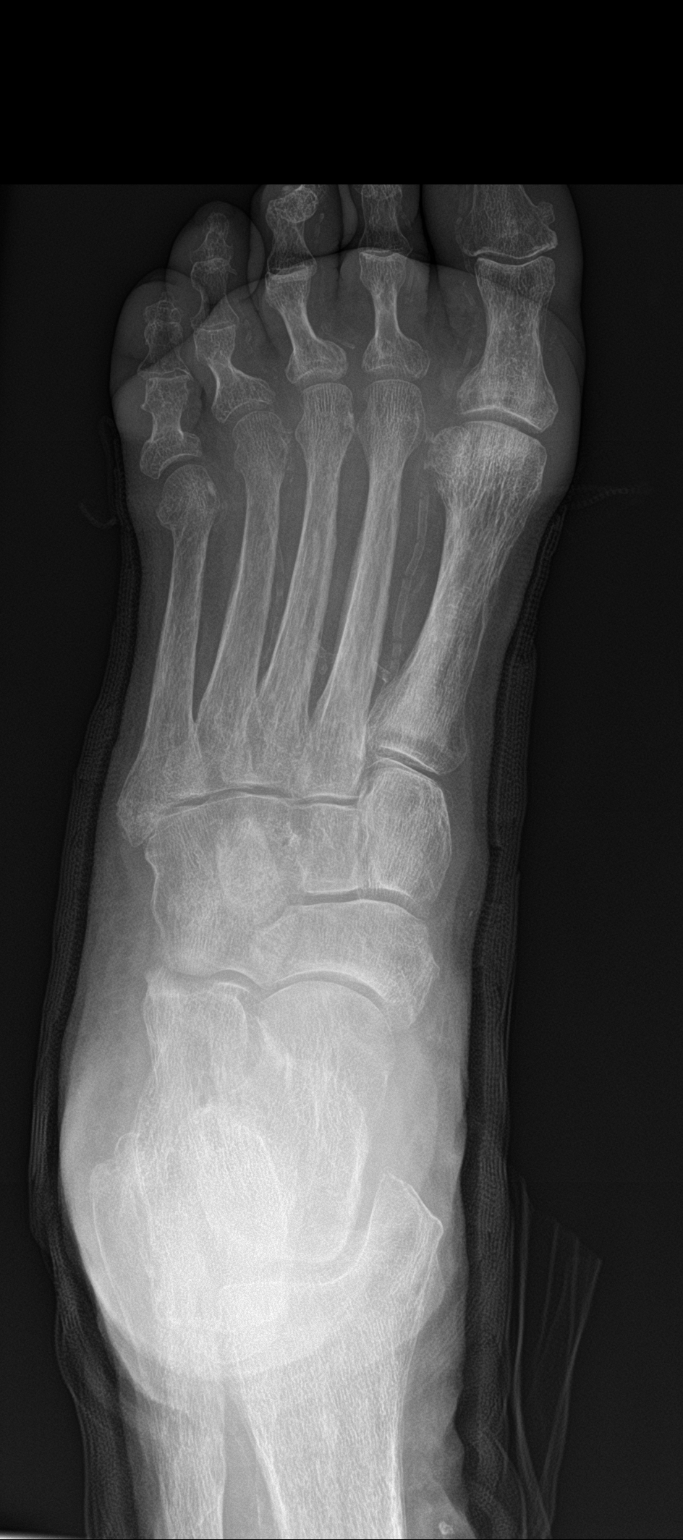

[foot obl]
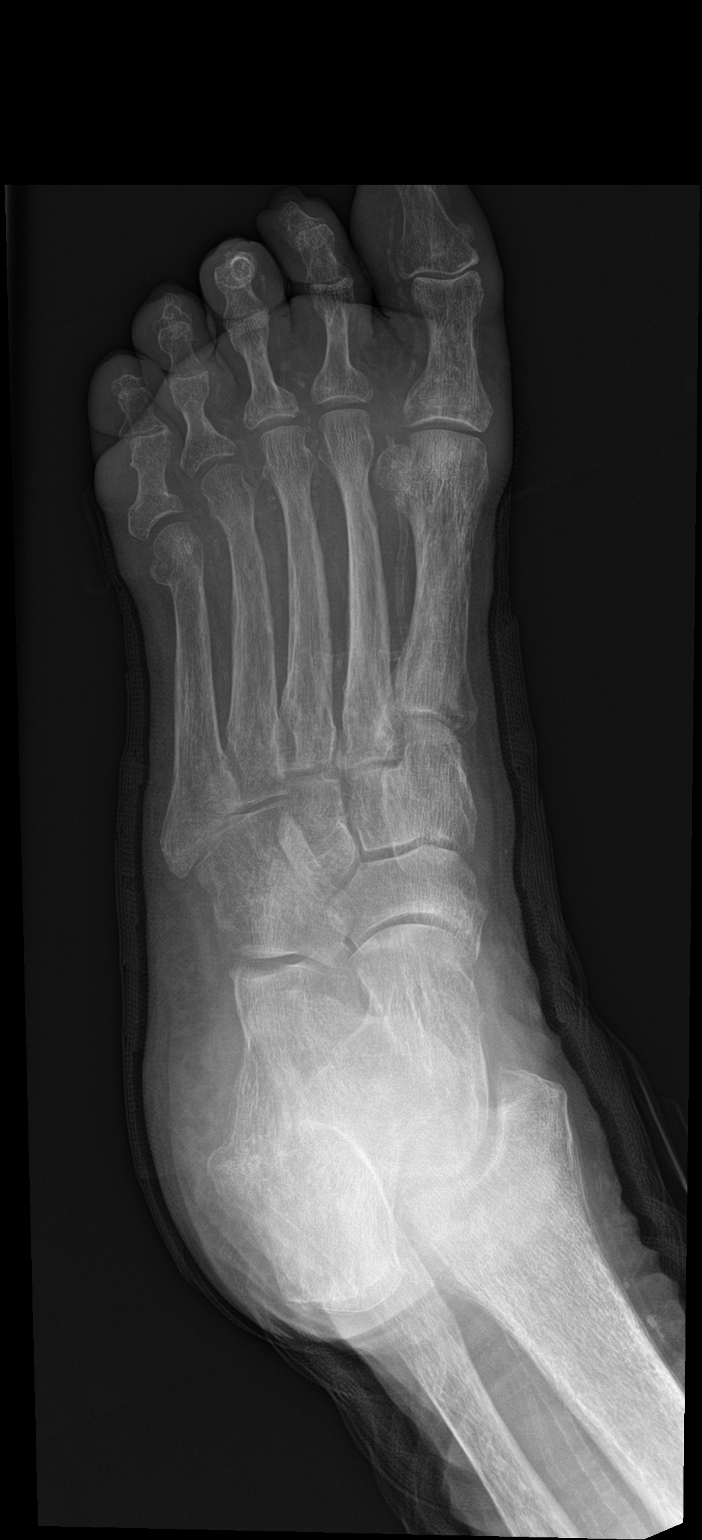

[foot lat]
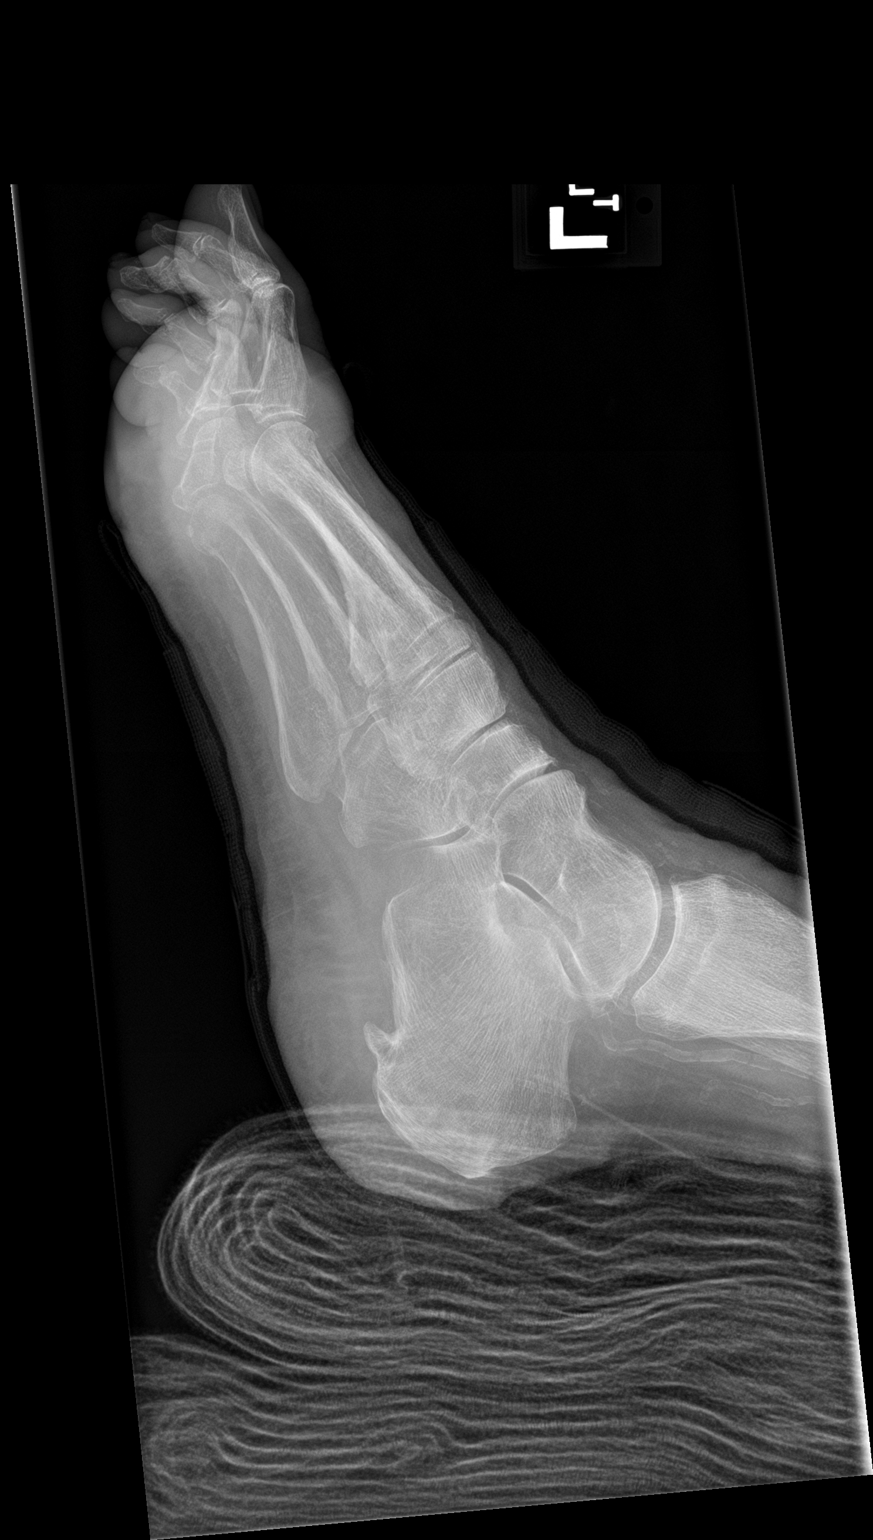

[3 of 3 positions shown; findings below may reference images not displayed]

FINDINGS: A faint linear lucency through the base of the fifth metatarsal is
likely artifactual or represent vascular groove. A nondisplaced
fracture is not excluded. Evaluation for fracture is limited due to
advanced osteopenia. No other fracture identified. There is no
dislocation. The soft tissues are grossly unremarkable.
IMPRESSION: Artifact versus less likely a nondisplaced fracture of the base of
the fifth metatarsal.

## 2022-07-30 DIAGNOSIS — E039 Hypothyroidism, unspecified: Secondary | ICD-10-CM | POA: Diagnosis not present

## 2022-07-30 DIAGNOSIS — I5030 Unspecified diastolic (congestive) heart failure: Secondary | ICD-10-CM | POA: Diagnosis not present

## 2022-07-30 DIAGNOSIS — I951 Orthostatic hypotension: Secondary | ICD-10-CM | POA: Diagnosis not present

## 2022-07-30 DIAGNOSIS — I4819 Other persistent atrial fibrillation: Secondary | ICD-10-CM | POA: Diagnosis not present

## 2022-07-30 DIAGNOSIS — I69891 Dysphagia following other cerebrovascular disease: Secondary | ICD-10-CM | POA: Diagnosis not present

## 2022-07-30 DIAGNOSIS — L219 Seborrheic dermatitis, unspecified: Secondary | ICD-10-CM | POA: Diagnosis not present

## 2022-07-30 DIAGNOSIS — J90 Pleural effusion, not elsewhere classified: Secondary | ICD-10-CM | POA: Diagnosis not present

## 2022-07-30 DIAGNOSIS — F32A Depression, unspecified: Secondary | ICD-10-CM | POA: Diagnosis not present

## 2022-07-30 DIAGNOSIS — I69818 Other symptoms and signs involving cognitive functions following other cerebrovascular disease: Secondary | ICD-10-CM | POA: Diagnosis not present

## 2022-07-30 DIAGNOSIS — D649 Anemia, unspecified: Secondary | ICD-10-CM | POA: Diagnosis not present

## 2022-08-01 DIAGNOSIS — I5030 Unspecified diastolic (congestive) heart failure: Secondary | ICD-10-CM | POA: Diagnosis not present

## 2022-08-01 DIAGNOSIS — I69891 Dysphagia following other cerebrovascular disease: Secondary | ICD-10-CM | POA: Diagnosis not present

## 2022-08-01 DIAGNOSIS — I4819 Other persistent atrial fibrillation: Secondary | ICD-10-CM | POA: Diagnosis not present

## 2022-08-01 DIAGNOSIS — J90 Pleural effusion, not elsewhere classified: Secondary | ICD-10-CM | POA: Diagnosis not present

## 2022-08-01 DIAGNOSIS — I69818 Other symptoms and signs involving cognitive functions following other cerebrovascular disease: Secondary | ICD-10-CM | POA: Diagnosis not present

## 2022-08-01 DIAGNOSIS — D649 Anemia, unspecified: Secondary | ICD-10-CM | POA: Diagnosis not present

## 2022-08-05 DIAGNOSIS — F411 Generalized anxiety disorder: Secondary | ICD-10-CM | POA: Diagnosis not present

## 2022-08-05 DIAGNOSIS — F32A Depression, unspecified: Secondary | ICD-10-CM | POA: Diagnosis not present

## 2022-08-06 DIAGNOSIS — I69891 Dysphagia following other cerebrovascular disease: Secondary | ICD-10-CM | POA: Diagnosis not present

## 2022-08-06 DIAGNOSIS — J90 Pleural effusion, not elsewhere classified: Secondary | ICD-10-CM | POA: Diagnosis not present

## 2022-08-06 DIAGNOSIS — D649 Anemia, unspecified: Secondary | ICD-10-CM | POA: Diagnosis not present

## 2022-08-06 DIAGNOSIS — I5030 Unspecified diastolic (congestive) heart failure: Secondary | ICD-10-CM | POA: Diagnosis not present

## 2022-08-06 DIAGNOSIS — I69818 Other symptoms and signs involving cognitive functions following other cerebrovascular disease: Secondary | ICD-10-CM | POA: Diagnosis not present

## 2022-08-06 DIAGNOSIS — I4819 Other persistent atrial fibrillation: Secondary | ICD-10-CM | POA: Diagnosis not present

## 2022-08-07 DIAGNOSIS — I10 Essential (primary) hypertension: Secondary | ICD-10-CM | POA: Diagnosis not present

## 2022-08-08 DIAGNOSIS — I69891 Dysphagia following other cerebrovascular disease: Secondary | ICD-10-CM | POA: Diagnosis not present

## 2022-08-08 DIAGNOSIS — I69818 Other symptoms and signs involving cognitive functions following other cerebrovascular disease: Secondary | ICD-10-CM | POA: Diagnosis not present

## 2022-08-08 DIAGNOSIS — J90 Pleural effusion, not elsewhere classified: Secondary | ICD-10-CM | POA: Diagnosis not present

## 2022-08-08 DIAGNOSIS — D649 Anemia, unspecified: Secondary | ICD-10-CM | POA: Diagnosis not present

## 2022-08-08 DIAGNOSIS — I4819 Other persistent atrial fibrillation: Secondary | ICD-10-CM | POA: Diagnosis not present

## 2022-08-08 DIAGNOSIS — I5030 Unspecified diastolic (congestive) heart failure: Secondary | ICD-10-CM | POA: Diagnosis not present

## 2022-08-09 DIAGNOSIS — F03A0 Unspecified dementia, mild, without behavioral disturbance, psychotic disturbance, mood disturbance, and anxiety: Secondary | ICD-10-CM | POA: Diagnosis not present

## 2022-08-09 DIAGNOSIS — I69818 Other symptoms and signs involving cognitive functions following other cerebrovascular disease: Secondary | ICD-10-CM | POA: Diagnosis not present

## 2022-08-09 DIAGNOSIS — I69891 Dysphagia following other cerebrovascular disease: Secondary | ICD-10-CM | POA: Diagnosis not present

## 2022-08-09 DIAGNOSIS — I4819 Other persistent atrial fibrillation: Secondary | ICD-10-CM | POA: Diagnosis not present

## 2022-08-09 DIAGNOSIS — E039 Hypothyroidism, unspecified: Secondary | ICD-10-CM | POA: Diagnosis not present

## 2022-08-09 DIAGNOSIS — I4891 Unspecified atrial fibrillation: Secondary | ICD-10-CM | POA: Diagnosis not present

## 2022-08-09 DIAGNOSIS — I5030 Unspecified diastolic (congestive) heart failure: Secondary | ICD-10-CM | POA: Diagnosis not present

## 2022-08-09 DIAGNOSIS — J90 Pleural effusion, not elsewhere classified: Secondary | ICD-10-CM | POA: Diagnosis not present

## 2022-08-09 DIAGNOSIS — Z66 Do not resuscitate: Secondary | ICD-10-CM | POA: Diagnosis not present

## 2022-08-09 DIAGNOSIS — D649 Anemia, unspecified: Secondary | ICD-10-CM | POA: Diagnosis not present

## 2022-08-12 ENCOUNTER — Encounter: Payer: Medicare Other | Admitting: Internal Medicine

## 2022-08-13 DIAGNOSIS — I69891 Dysphagia following other cerebrovascular disease: Secondary | ICD-10-CM | POA: Diagnosis not present

## 2022-08-13 DIAGNOSIS — I4819 Other persistent atrial fibrillation: Secondary | ICD-10-CM | POA: Diagnosis not present

## 2022-08-13 DIAGNOSIS — I5030 Unspecified diastolic (congestive) heart failure: Secondary | ICD-10-CM | POA: Diagnosis not present

## 2022-08-13 DIAGNOSIS — D649 Anemia, unspecified: Secondary | ICD-10-CM | POA: Diagnosis not present

## 2022-08-13 DIAGNOSIS — I69818 Other symptoms and signs involving cognitive functions following other cerebrovascular disease: Secondary | ICD-10-CM | POA: Diagnosis not present

## 2022-08-13 DIAGNOSIS — J90 Pleural effusion, not elsewhere classified: Secondary | ICD-10-CM | POA: Diagnosis not present

## 2022-08-14 DIAGNOSIS — I69818 Other symptoms and signs involving cognitive functions following other cerebrovascular disease: Secondary | ICD-10-CM | POA: Diagnosis not present

## 2022-08-14 DIAGNOSIS — D649 Anemia, unspecified: Secondary | ICD-10-CM | POA: Diagnosis not present

## 2022-08-14 DIAGNOSIS — J90 Pleural effusion, not elsewhere classified: Secondary | ICD-10-CM | POA: Diagnosis not present

## 2022-08-14 DIAGNOSIS — I4819 Other persistent atrial fibrillation: Secondary | ICD-10-CM | POA: Diagnosis not present

## 2022-08-14 DIAGNOSIS — I5030 Unspecified diastolic (congestive) heart failure: Secondary | ICD-10-CM | POA: Diagnosis not present

## 2022-08-14 DIAGNOSIS — I69891 Dysphagia following other cerebrovascular disease: Secondary | ICD-10-CM | POA: Diagnosis not present

## 2022-08-20 DIAGNOSIS — I69891 Dysphagia following other cerebrovascular disease: Secondary | ICD-10-CM | POA: Diagnosis not present

## 2022-08-20 DIAGNOSIS — I5030 Unspecified diastolic (congestive) heart failure: Secondary | ICD-10-CM | POA: Diagnosis not present

## 2022-08-20 DIAGNOSIS — D649 Anemia, unspecified: Secondary | ICD-10-CM | POA: Diagnosis not present

## 2022-08-20 DIAGNOSIS — I4819 Other persistent atrial fibrillation: Secondary | ICD-10-CM | POA: Diagnosis not present

## 2022-08-20 DIAGNOSIS — J90 Pleural effusion, not elsewhere classified: Secondary | ICD-10-CM | POA: Diagnosis not present

## 2022-08-20 DIAGNOSIS — I69818 Other symptoms and signs involving cognitive functions following other cerebrovascular disease: Secondary | ICD-10-CM | POA: Diagnosis not present

## 2022-08-22 DIAGNOSIS — I4819 Other persistent atrial fibrillation: Secondary | ICD-10-CM | POA: Diagnosis not present

## 2022-08-22 DIAGNOSIS — I69891 Dysphagia following other cerebrovascular disease: Secondary | ICD-10-CM | POA: Diagnosis not present

## 2022-08-22 DIAGNOSIS — J90 Pleural effusion, not elsewhere classified: Secondary | ICD-10-CM | POA: Diagnosis not present

## 2022-08-22 DIAGNOSIS — I5030 Unspecified diastolic (congestive) heart failure: Secondary | ICD-10-CM | POA: Diagnosis not present

## 2022-08-22 DIAGNOSIS — I69818 Other symptoms and signs involving cognitive functions following other cerebrovascular disease: Secondary | ICD-10-CM | POA: Diagnosis not present

## 2022-08-22 DIAGNOSIS — D649 Anemia, unspecified: Secondary | ICD-10-CM | POA: Diagnosis not present

## 2022-08-23 DIAGNOSIS — Z85118 Personal history of other malignant neoplasm of bronchus and lung: Secondary | ICD-10-CM | POA: Diagnosis not present

## 2022-08-23 DIAGNOSIS — K635 Polyp of colon: Secondary | ICD-10-CM | POA: Diagnosis not present

## 2022-08-23 DIAGNOSIS — R159 Full incontinence of feces: Secondary | ICD-10-CM | POA: Diagnosis not present

## 2022-08-23 DIAGNOSIS — Z8673 Personal history of transient ischemic attack (TIA), and cerebral infarction without residual deficits: Secondary | ICD-10-CM | POA: Diagnosis not present

## 2022-08-23 DIAGNOSIS — R32 Unspecified urinary incontinence: Secondary | ICD-10-CM | POA: Diagnosis not present

## 2022-08-23 DIAGNOSIS — I5030 Unspecified diastolic (congestive) heart failure: Secondary | ICD-10-CM | POA: Diagnosis not present

## 2022-08-23 DIAGNOSIS — I4819 Other persistent atrial fibrillation: Secondary | ICD-10-CM | POA: Diagnosis not present

## 2022-08-23 DIAGNOSIS — N200 Calculus of kidney: Secondary | ICD-10-CM | POA: Diagnosis not present

## 2022-08-23 DIAGNOSIS — Z8572 Personal history of non-Hodgkin lymphomas: Secondary | ICD-10-CM | POA: Diagnosis not present

## 2022-08-23 DIAGNOSIS — Z90710 Acquired absence of both cervix and uterus: Secondary | ICD-10-CM | POA: Diagnosis not present

## 2022-08-23 DIAGNOSIS — N809 Endometriosis, unspecified: Secondary | ICD-10-CM | POA: Diagnosis not present

## 2022-08-23 DIAGNOSIS — M1990 Unspecified osteoarthritis, unspecified site: Secondary | ICD-10-CM | POA: Diagnosis not present

## 2022-08-23 DIAGNOSIS — Z6821 Body mass index (BMI) 21.0-21.9, adult: Secondary | ICD-10-CM | POA: Diagnosis not present

## 2022-08-23 DIAGNOSIS — J690 Pneumonitis due to inhalation of food and vomit: Secondary | ICD-10-CM | POA: Diagnosis not present

## 2022-08-23 DIAGNOSIS — Z853 Personal history of malignant neoplasm of breast: Secondary | ICD-10-CM | POA: Diagnosis not present

## 2022-08-23 DIAGNOSIS — K760 Fatty (change of) liver, not elsewhere classified: Secondary | ICD-10-CM | POA: Diagnosis not present

## 2022-08-23 DIAGNOSIS — F32A Depression, unspecified: Secondary | ICD-10-CM | POA: Diagnosis not present

## 2022-08-23 DIAGNOSIS — I69891 Dysphagia following other cerebrovascular disease: Secondary | ICD-10-CM | POA: Diagnosis not present

## 2022-08-23 DIAGNOSIS — D649 Anemia, unspecified: Secondary | ICD-10-CM | POA: Diagnosis not present

## 2022-08-23 DIAGNOSIS — Z741 Need for assistance with personal care: Secondary | ICD-10-CM | POA: Diagnosis not present

## 2022-08-23 DIAGNOSIS — J90 Pleural effusion, not elsewhere classified: Secondary | ICD-10-CM | POA: Diagnosis not present

## 2022-08-23 DIAGNOSIS — Z8639 Personal history of other endocrine, nutritional and metabolic disease: Secondary | ICD-10-CM | POA: Diagnosis not present

## 2022-08-23 DIAGNOSIS — I69818 Other symptoms and signs involving cognitive functions following other cerebrovascular disease: Secondary | ICD-10-CM | POA: Diagnosis not present

## 2022-08-23 DIAGNOSIS — Z9884 Bariatric surgery status: Secondary | ICD-10-CM | POA: Diagnosis not present

## 2022-08-23 DIAGNOSIS — Z9981 Dependence on supplemental oxygen: Secondary | ICD-10-CM | POA: Diagnosis not present

## 2022-08-26 DIAGNOSIS — I5030 Unspecified diastolic (congestive) heart failure: Secondary | ICD-10-CM | POA: Diagnosis not present

## 2022-08-26 DIAGNOSIS — I69891 Dysphagia following other cerebrovascular disease: Secondary | ICD-10-CM | POA: Diagnosis not present

## 2022-08-26 DIAGNOSIS — J90 Pleural effusion, not elsewhere classified: Secondary | ICD-10-CM | POA: Diagnosis not present

## 2022-08-26 DIAGNOSIS — I4819 Other persistent atrial fibrillation: Secondary | ICD-10-CM | POA: Diagnosis not present

## 2022-08-26 DIAGNOSIS — D649 Anemia, unspecified: Secondary | ICD-10-CM | POA: Diagnosis not present

## 2022-08-26 DIAGNOSIS — I69818 Other symptoms and signs involving cognitive functions following other cerebrovascular disease: Secondary | ICD-10-CM | POA: Diagnosis not present

## 2022-08-27 DIAGNOSIS — I4819 Other persistent atrial fibrillation: Secondary | ICD-10-CM | POA: Diagnosis not present

## 2022-08-27 DIAGNOSIS — I69818 Other symptoms and signs involving cognitive functions following other cerebrovascular disease: Secondary | ICD-10-CM | POA: Diagnosis not present

## 2022-08-27 DIAGNOSIS — J90 Pleural effusion, not elsewhere classified: Secondary | ICD-10-CM | POA: Diagnosis not present

## 2022-08-27 DIAGNOSIS — I5030 Unspecified diastolic (congestive) heart failure: Secondary | ICD-10-CM | POA: Diagnosis not present

## 2022-08-27 DIAGNOSIS — D649 Anemia, unspecified: Secondary | ICD-10-CM | POA: Diagnosis not present

## 2022-08-27 DIAGNOSIS — I69891 Dysphagia following other cerebrovascular disease: Secondary | ICD-10-CM | POA: Diagnosis not present

## 2022-08-29 DIAGNOSIS — J90 Pleural effusion, not elsewhere classified: Secondary | ICD-10-CM | POA: Diagnosis not present

## 2022-08-29 DIAGNOSIS — I4819 Other persistent atrial fibrillation: Secondary | ICD-10-CM | POA: Diagnosis not present

## 2022-08-29 DIAGNOSIS — D649 Anemia, unspecified: Secondary | ICD-10-CM | POA: Diagnosis not present

## 2022-08-29 DIAGNOSIS — I69818 Other symptoms and signs involving cognitive functions following other cerebrovascular disease: Secondary | ICD-10-CM | POA: Diagnosis not present

## 2022-08-29 DIAGNOSIS — I5030 Unspecified diastolic (congestive) heart failure: Secondary | ICD-10-CM | POA: Diagnosis not present

## 2022-08-29 DIAGNOSIS — I69891 Dysphagia following other cerebrovascular disease: Secondary | ICD-10-CM | POA: Diagnosis not present

## 2022-09-01 DIAGNOSIS — I5030 Unspecified diastolic (congestive) heart failure: Secondary | ICD-10-CM | POA: Diagnosis not present

## 2022-09-01 DIAGNOSIS — I69818 Other symptoms and signs involving cognitive functions following other cerebrovascular disease: Secondary | ICD-10-CM | POA: Diagnosis not present

## 2022-09-01 DIAGNOSIS — J90 Pleural effusion, not elsewhere classified: Secondary | ICD-10-CM | POA: Diagnosis not present

## 2022-09-01 DIAGNOSIS — I4819 Other persistent atrial fibrillation: Secondary | ICD-10-CM | POA: Diagnosis not present

## 2022-09-01 DIAGNOSIS — D649 Anemia, unspecified: Secondary | ICD-10-CM | POA: Diagnosis not present

## 2022-09-01 DIAGNOSIS — I69891 Dysphagia following other cerebrovascular disease: Secondary | ICD-10-CM | POA: Diagnosis not present

## 2022-09-03 DIAGNOSIS — I69891 Dysphagia following other cerebrovascular disease: Secondary | ICD-10-CM | POA: Diagnosis not present

## 2022-09-03 DIAGNOSIS — D649 Anemia, unspecified: Secondary | ICD-10-CM | POA: Diagnosis not present

## 2022-09-03 DIAGNOSIS — J90 Pleural effusion, not elsewhere classified: Secondary | ICD-10-CM | POA: Diagnosis not present

## 2022-09-03 DIAGNOSIS — I4819 Other persistent atrial fibrillation: Secondary | ICD-10-CM | POA: Diagnosis not present

## 2022-09-03 DIAGNOSIS — I5030 Unspecified diastolic (congestive) heart failure: Secondary | ICD-10-CM | POA: Diagnosis not present

## 2022-09-03 DIAGNOSIS — I69818 Other symptoms and signs involving cognitive functions following other cerebrovascular disease: Secondary | ICD-10-CM | POA: Diagnosis not present

## 2022-09-05 DIAGNOSIS — J90 Pleural effusion, not elsewhere classified: Secondary | ICD-10-CM | POA: Diagnosis not present

## 2022-09-05 DIAGNOSIS — I69818 Other symptoms and signs involving cognitive functions following other cerebrovascular disease: Secondary | ICD-10-CM | POA: Diagnosis not present

## 2022-09-05 DIAGNOSIS — I4819 Other persistent atrial fibrillation: Secondary | ICD-10-CM | POA: Diagnosis not present

## 2022-09-05 DIAGNOSIS — I69891 Dysphagia following other cerebrovascular disease: Secondary | ICD-10-CM | POA: Diagnosis not present

## 2022-09-05 DIAGNOSIS — D649 Anemia, unspecified: Secondary | ICD-10-CM | POA: Diagnosis not present

## 2022-09-05 DIAGNOSIS — I5030 Unspecified diastolic (congestive) heart failure: Secondary | ICD-10-CM | POA: Diagnosis not present

## 2022-09-10 DIAGNOSIS — D649 Anemia, unspecified: Secondary | ICD-10-CM | POA: Diagnosis not present

## 2022-09-10 DIAGNOSIS — I4819 Other persistent atrial fibrillation: Secondary | ICD-10-CM | POA: Diagnosis not present

## 2022-09-10 DIAGNOSIS — J90 Pleural effusion, not elsewhere classified: Secondary | ICD-10-CM | POA: Diagnosis not present

## 2022-09-10 DIAGNOSIS — I69818 Other symptoms and signs involving cognitive functions following other cerebrovascular disease: Secondary | ICD-10-CM | POA: Diagnosis not present

## 2022-09-10 DIAGNOSIS — I5030 Unspecified diastolic (congestive) heart failure: Secondary | ICD-10-CM | POA: Diagnosis not present

## 2022-09-10 DIAGNOSIS — I69891 Dysphagia following other cerebrovascular disease: Secondary | ICD-10-CM | POA: Diagnosis not present

## 2022-09-12 DIAGNOSIS — I69818 Other symptoms and signs involving cognitive functions following other cerebrovascular disease: Secondary | ICD-10-CM | POA: Diagnosis not present

## 2022-09-12 DIAGNOSIS — I4819 Other persistent atrial fibrillation: Secondary | ICD-10-CM | POA: Diagnosis not present

## 2022-09-12 DIAGNOSIS — J90 Pleural effusion, not elsewhere classified: Secondary | ICD-10-CM | POA: Diagnosis not present

## 2022-09-12 DIAGNOSIS — I5030 Unspecified diastolic (congestive) heart failure: Secondary | ICD-10-CM | POA: Diagnosis not present

## 2022-09-12 DIAGNOSIS — D649 Anemia, unspecified: Secondary | ICD-10-CM | POA: Diagnosis not present

## 2022-09-12 DIAGNOSIS — I69891 Dysphagia following other cerebrovascular disease: Secondary | ICD-10-CM | POA: Diagnosis not present

## 2022-09-17 DIAGNOSIS — I69818 Other symptoms and signs involving cognitive functions following other cerebrovascular disease: Secondary | ICD-10-CM | POA: Diagnosis not present

## 2022-09-17 DIAGNOSIS — D649 Anemia, unspecified: Secondary | ICD-10-CM | POA: Diagnosis not present

## 2022-09-17 DIAGNOSIS — I5030 Unspecified diastolic (congestive) heart failure: Secondary | ICD-10-CM | POA: Diagnosis not present

## 2022-09-17 DIAGNOSIS — I69891 Dysphagia following other cerebrovascular disease: Secondary | ICD-10-CM | POA: Diagnosis not present

## 2022-09-17 DIAGNOSIS — I4819 Other persistent atrial fibrillation: Secondary | ICD-10-CM | POA: Diagnosis not present

## 2022-09-17 DIAGNOSIS — J90 Pleural effusion, not elsewhere classified: Secondary | ICD-10-CM | POA: Diagnosis not present

## 2022-09-17 IMAGING — XA IR PICC >5YO
1 series · 2 of 2 positions shown · non-contrast
Comparison: none

INDICATION: 74-year-old female with a history of infection referred for PICC

[Series 1: fl (-) angio · 2 of 2 slices shown]
[im 1/2]
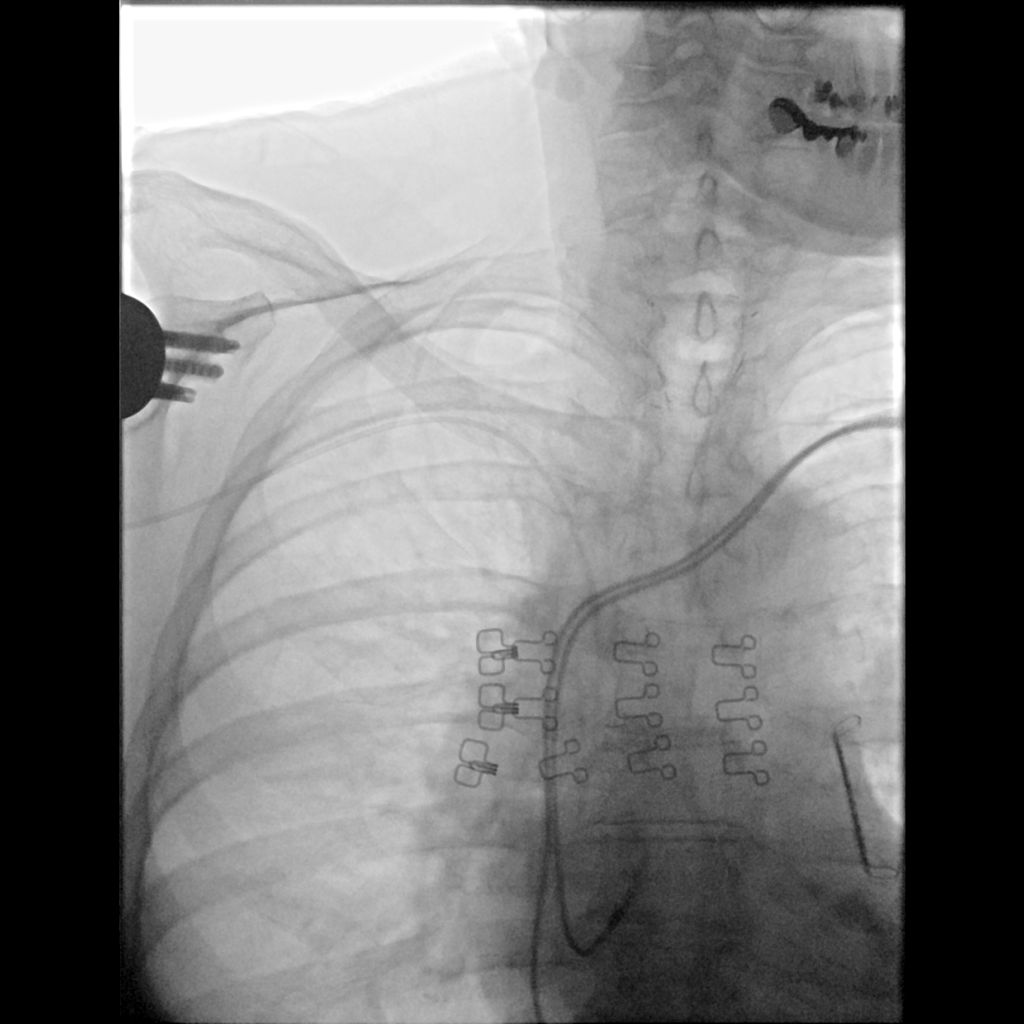
[im 2/2]
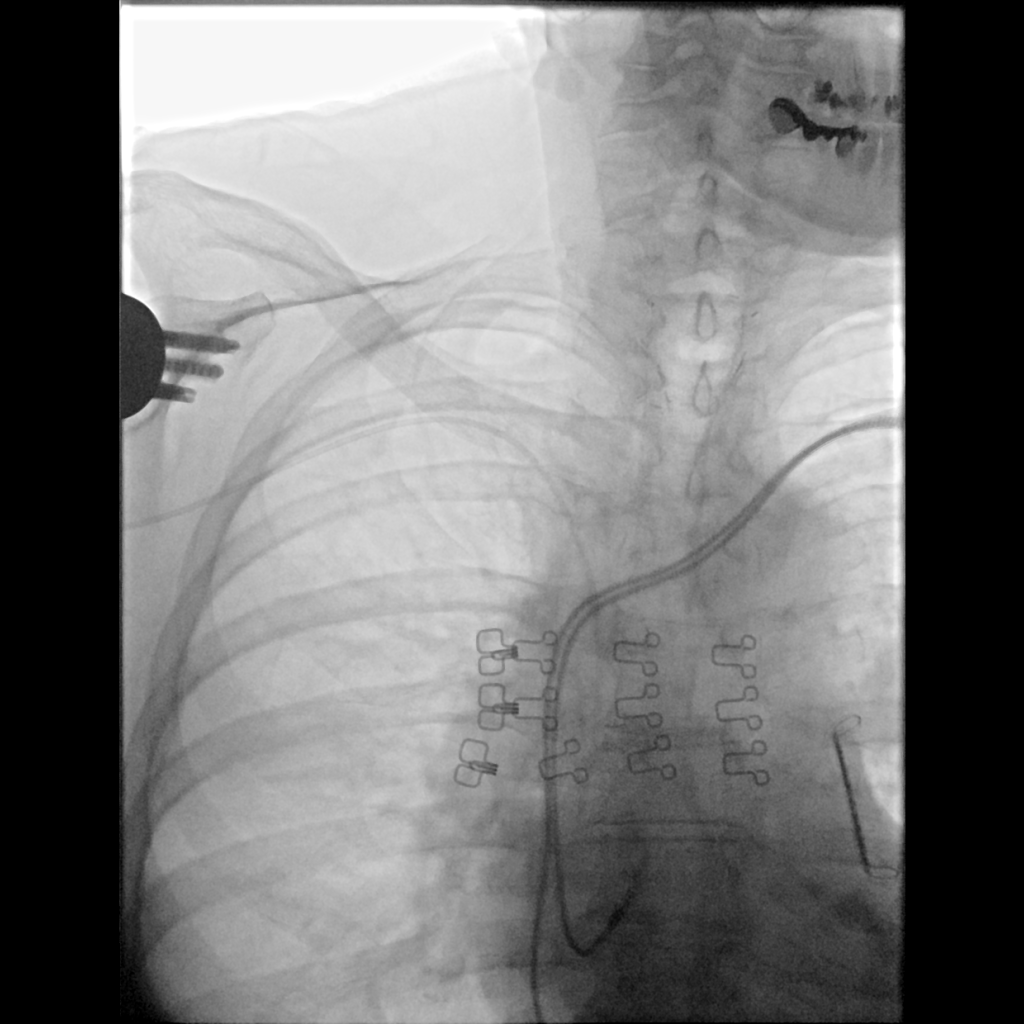

[2 of 2 positions shown; findings below may reference images not displayed]

EXAM:
IMAGE GUIDED PLACEMENT OF RIGHT UPPER EXTREMITY PICC

MEDICATIONS:
None

ANESTHESIA/SEDATION:
None

FLUOROSCOPY TIME:  Fluoroscopy Time: 0 minutes 6 seconds (1 mGy).

COMPLICATIONS:
None

PROCEDURE:
Informed written consent was obtained from the patient after a
thorough discussion of the procedural risks, benefits and
alternatives. All questions were addressed. Maximal Sterile Barrier
Technique was utilized including caps, mask, sterile gowns, sterile
gloves, sterile drape, hand hygiene and skin antiseptic. A timeout
was performed prior to the initiation of the procedure.

Patient was position in the supine position on the fluoroscopy table
with the right arm abducted 90 degrees. Ultrasound survey of the
upper extremity was performed with images stored and sent to PACs.

The right brachial vein was selected for access. Dual/parallel
brachial veins.

Once the patient was prepped and draped in the usual sterile
fashion, the skin and subcutaneous tissues were generously
infiltrated with 1% lidocaine for local anesthesia.

A micropuncture access kit was then used to access the targeted
vein. Wire was passed centrally, confirmed to be within the venous
system under fluoroscopy. A small stab incision was made with an 11
blade scalpel and the sheath was then placed over the wire.
Estimated length of the catheter was then performed with the
indwelling wire.

Catheter was amputated at 37 cm length and placed with coaxial wire
through the peel-away.

Double lumen, power injectable PICC in the right brachial vein. Tip
confirmed at the cavoatrial junction, and the catheter is ready for
use.

Stat lock was placed.

Patient tolerated the procedure well and remained hemodynamically
stable throughout.

No complications were encountered and no significant blood loss was
encountered.
IMPRESSION: Status post right upper extremity PICC.

## 2022-09-19 DIAGNOSIS — I4819 Other persistent atrial fibrillation: Secondary | ICD-10-CM | POA: Diagnosis not present

## 2022-09-19 DIAGNOSIS — I69891 Dysphagia following other cerebrovascular disease: Secondary | ICD-10-CM | POA: Diagnosis not present

## 2022-09-19 DIAGNOSIS — D649 Anemia, unspecified: Secondary | ICD-10-CM | POA: Diagnosis not present

## 2022-09-19 DIAGNOSIS — I5030 Unspecified diastolic (congestive) heart failure: Secondary | ICD-10-CM | POA: Diagnosis not present

## 2022-09-19 DIAGNOSIS — I69818 Other symptoms and signs involving cognitive functions following other cerebrovascular disease: Secondary | ICD-10-CM | POA: Diagnosis not present

## 2022-09-19 DIAGNOSIS — J90 Pleural effusion, not elsewhere classified: Secondary | ICD-10-CM | POA: Diagnosis not present

## 2022-09-23 DIAGNOSIS — Z90710 Acquired absence of both cervix and uterus: Secondary | ICD-10-CM | POA: Diagnosis not present

## 2022-09-23 DIAGNOSIS — Z8673 Personal history of transient ischemic attack (TIA), and cerebral infarction without residual deficits: Secondary | ICD-10-CM | POA: Diagnosis not present

## 2022-09-23 DIAGNOSIS — I5030 Unspecified diastolic (congestive) heart failure: Secondary | ICD-10-CM | POA: Diagnosis not present

## 2022-09-23 DIAGNOSIS — Z85118 Personal history of other malignant neoplasm of bronchus and lung: Secondary | ICD-10-CM | POA: Diagnosis not present

## 2022-09-23 DIAGNOSIS — M1990 Unspecified osteoarthritis, unspecified site: Secondary | ICD-10-CM | POA: Diagnosis not present

## 2022-09-23 DIAGNOSIS — Z9884 Bariatric surgery status: Secondary | ICD-10-CM | POA: Diagnosis not present

## 2022-09-23 DIAGNOSIS — R32 Unspecified urinary incontinence: Secondary | ICD-10-CM | POA: Diagnosis not present

## 2022-09-23 DIAGNOSIS — Z6821 Body mass index (BMI) 21.0-21.9, adult: Secondary | ICD-10-CM | POA: Diagnosis not present

## 2022-09-23 DIAGNOSIS — I69818 Other symptoms and signs involving cognitive functions following other cerebrovascular disease: Secondary | ICD-10-CM | POA: Diagnosis not present

## 2022-09-23 DIAGNOSIS — J90 Pleural effusion, not elsewhere classified: Secondary | ICD-10-CM | POA: Diagnosis not present

## 2022-09-23 DIAGNOSIS — F32A Depression, unspecified: Secondary | ICD-10-CM | POA: Diagnosis not present

## 2022-09-23 DIAGNOSIS — D649 Anemia, unspecified: Secondary | ICD-10-CM | POA: Diagnosis not present

## 2022-09-23 DIAGNOSIS — Z741 Need for assistance with personal care: Secondary | ICD-10-CM | POA: Diagnosis not present

## 2022-09-23 DIAGNOSIS — Z853 Personal history of malignant neoplasm of breast: Secondary | ICD-10-CM | POA: Diagnosis not present

## 2022-09-23 DIAGNOSIS — E039 Hypothyroidism, unspecified: Secondary | ICD-10-CM | POA: Diagnosis not present

## 2022-09-23 DIAGNOSIS — N200 Calculus of kidney: Secondary | ICD-10-CM | POA: Diagnosis not present

## 2022-09-23 DIAGNOSIS — Z8572 Personal history of non-Hodgkin lymphomas: Secondary | ICD-10-CM | POA: Diagnosis not present

## 2022-09-23 DIAGNOSIS — K635 Polyp of colon: Secondary | ICD-10-CM | POA: Diagnosis not present

## 2022-09-23 DIAGNOSIS — N809 Endometriosis, unspecified: Secondary | ICD-10-CM | POA: Diagnosis not present

## 2022-09-23 DIAGNOSIS — I4819 Other persistent atrial fibrillation: Secondary | ICD-10-CM | POA: Diagnosis not present

## 2022-09-23 DIAGNOSIS — J690 Pneumonitis due to inhalation of food and vomit: Secondary | ICD-10-CM | POA: Diagnosis not present

## 2022-09-23 DIAGNOSIS — K760 Fatty (change of) liver, not elsewhere classified: Secondary | ICD-10-CM | POA: Diagnosis not present

## 2022-09-23 DIAGNOSIS — I69891 Dysphagia following other cerebrovascular disease: Secondary | ICD-10-CM | POA: Diagnosis not present

## 2022-09-23 DIAGNOSIS — Z9981 Dependence on supplemental oxygen: Secondary | ICD-10-CM | POA: Diagnosis not present

## 2022-09-23 DIAGNOSIS — R159 Full incontinence of feces: Secondary | ICD-10-CM | POA: Diagnosis not present

## 2022-09-23 DIAGNOSIS — Z8639 Personal history of other endocrine, nutritional and metabolic disease: Secondary | ICD-10-CM | POA: Diagnosis not present

## 2022-09-24 DIAGNOSIS — J90 Pleural effusion, not elsewhere classified: Secondary | ICD-10-CM | POA: Diagnosis not present

## 2022-09-24 DIAGNOSIS — D649 Anemia, unspecified: Secondary | ICD-10-CM | POA: Diagnosis not present

## 2022-09-24 DIAGNOSIS — I5030 Unspecified diastolic (congestive) heart failure: Secondary | ICD-10-CM | POA: Diagnosis not present

## 2022-09-24 DIAGNOSIS — I69818 Other symptoms and signs involving cognitive functions following other cerebrovascular disease: Secondary | ICD-10-CM | POA: Diagnosis not present

## 2022-09-24 DIAGNOSIS — I69891 Dysphagia following other cerebrovascular disease: Secondary | ICD-10-CM | POA: Diagnosis not present

## 2022-09-24 DIAGNOSIS — I4819 Other persistent atrial fibrillation: Secondary | ICD-10-CM | POA: Diagnosis not present

## 2022-09-26 DIAGNOSIS — J90 Pleural effusion, not elsewhere classified: Secondary | ICD-10-CM | POA: Diagnosis not present

## 2022-09-26 DIAGNOSIS — I69818 Other symptoms and signs involving cognitive functions following other cerebrovascular disease: Secondary | ICD-10-CM | POA: Diagnosis not present

## 2022-09-26 DIAGNOSIS — I5030 Unspecified diastolic (congestive) heart failure: Secondary | ICD-10-CM | POA: Diagnosis not present

## 2022-09-26 DIAGNOSIS — I69891 Dysphagia following other cerebrovascular disease: Secondary | ICD-10-CM | POA: Diagnosis not present

## 2022-09-26 DIAGNOSIS — D649 Anemia, unspecified: Secondary | ICD-10-CM | POA: Diagnosis not present

## 2022-09-26 DIAGNOSIS — I4819 Other persistent atrial fibrillation: Secondary | ICD-10-CM | POA: Diagnosis not present

## 2022-10-01 DIAGNOSIS — J90 Pleural effusion, not elsewhere classified: Secondary | ICD-10-CM | POA: Diagnosis not present

## 2022-10-01 DIAGNOSIS — I5030 Unspecified diastolic (congestive) heart failure: Secondary | ICD-10-CM | POA: Diagnosis not present

## 2022-10-01 DIAGNOSIS — I4819 Other persistent atrial fibrillation: Secondary | ICD-10-CM | POA: Diagnosis not present

## 2022-10-01 DIAGNOSIS — D649 Anemia, unspecified: Secondary | ICD-10-CM | POA: Diagnosis not present

## 2022-10-01 DIAGNOSIS — I69891 Dysphagia following other cerebrovascular disease: Secondary | ICD-10-CM | POA: Diagnosis not present

## 2022-10-01 DIAGNOSIS — I69818 Other symptoms and signs involving cognitive functions following other cerebrovascular disease: Secondary | ICD-10-CM | POA: Diagnosis not present

## 2022-10-03 DIAGNOSIS — D649 Anemia, unspecified: Secondary | ICD-10-CM | POA: Diagnosis not present

## 2022-10-03 DIAGNOSIS — I4819 Other persistent atrial fibrillation: Secondary | ICD-10-CM | POA: Diagnosis not present

## 2022-10-03 DIAGNOSIS — I69891 Dysphagia following other cerebrovascular disease: Secondary | ICD-10-CM | POA: Diagnosis not present

## 2022-10-03 DIAGNOSIS — I69818 Other symptoms and signs involving cognitive functions following other cerebrovascular disease: Secondary | ICD-10-CM | POA: Diagnosis not present

## 2022-10-03 DIAGNOSIS — I5030 Unspecified diastolic (congestive) heart failure: Secondary | ICD-10-CM | POA: Diagnosis not present

## 2022-10-03 DIAGNOSIS — J90 Pleural effusion, not elsewhere classified: Secondary | ICD-10-CM | POA: Diagnosis not present

## 2022-10-07 DIAGNOSIS — E039 Hypothyroidism, unspecified: Secondary | ICD-10-CM | POA: Diagnosis not present

## 2022-10-08 DIAGNOSIS — J90 Pleural effusion, not elsewhere classified: Secondary | ICD-10-CM | POA: Diagnosis not present

## 2022-10-08 DIAGNOSIS — D649 Anemia, unspecified: Secondary | ICD-10-CM | POA: Diagnosis not present

## 2022-10-08 DIAGNOSIS — I5030 Unspecified diastolic (congestive) heart failure: Secondary | ICD-10-CM | POA: Diagnosis not present

## 2022-10-08 DIAGNOSIS — I69818 Other symptoms and signs involving cognitive functions following other cerebrovascular disease: Secondary | ICD-10-CM | POA: Diagnosis not present

## 2022-10-08 DIAGNOSIS — I4819 Other persistent atrial fibrillation: Secondary | ICD-10-CM | POA: Diagnosis not present

## 2022-10-08 DIAGNOSIS — I69891 Dysphagia following other cerebrovascular disease: Secondary | ICD-10-CM | POA: Diagnosis not present

## 2022-10-10 DIAGNOSIS — I4819 Other persistent atrial fibrillation: Secondary | ICD-10-CM | POA: Diagnosis not present

## 2022-10-10 DIAGNOSIS — I69891 Dysphagia following other cerebrovascular disease: Secondary | ICD-10-CM | POA: Diagnosis not present

## 2022-10-10 DIAGNOSIS — I69818 Other symptoms and signs involving cognitive functions following other cerebrovascular disease: Secondary | ICD-10-CM | POA: Diagnosis not present

## 2022-10-10 DIAGNOSIS — J90 Pleural effusion, not elsewhere classified: Secondary | ICD-10-CM | POA: Diagnosis not present

## 2022-10-10 DIAGNOSIS — D649 Anemia, unspecified: Secondary | ICD-10-CM | POA: Diagnosis not present

## 2022-10-10 DIAGNOSIS — I5030 Unspecified diastolic (congestive) heart failure: Secondary | ICD-10-CM | POA: Diagnosis not present

## 2022-10-15 DIAGNOSIS — I69818 Other symptoms and signs involving cognitive functions following other cerebrovascular disease: Secondary | ICD-10-CM | POA: Diagnosis not present

## 2022-10-15 DIAGNOSIS — I4819 Other persistent atrial fibrillation: Secondary | ICD-10-CM | POA: Diagnosis not present

## 2022-10-15 DIAGNOSIS — I5030 Unspecified diastolic (congestive) heart failure: Secondary | ICD-10-CM | POA: Diagnosis not present

## 2022-10-15 DIAGNOSIS — J90 Pleural effusion, not elsewhere classified: Secondary | ICD-10-CM | POA: Diagnosis not present

## 2022-10-15 DIAGNOSIS — I69891 Dysphagia following other cerebrovascular disease: Secondary | ICD-10-CM | POA: Diagnosis not present

## 2022-10-15 DIAGNOSIS — D649 Anemia, unspecified: Secondary | ICD-10-CM | POA: Diagnosis not present

## 2022-10-15 IMAGING — CT CT HEAD W/O CM
3 series · 15 of 47 positions shown, 18 images · non-contrast
Comparison: Head CT dated 12/15/2020

CLINICAL DATA: 74-year-old female with altered mental status.

EXAM:
CT HEAD WITHOUT CONTRAST
TECHNIQUE: Contiguous axial images were obtained from the base of the skull
through the vertex without intravenous contrast.

[Series 2: head wo · axial · 0.47mm/px · z∈[-340,-215]mm · 9 of 31 slices shown, 12 images]
[im 3/31  brain]
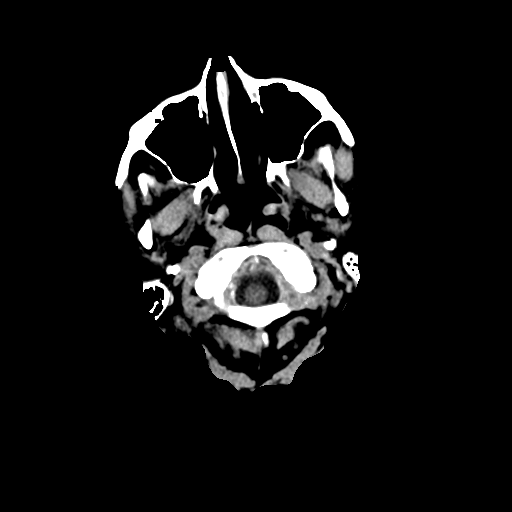
[im 3/31  bone]
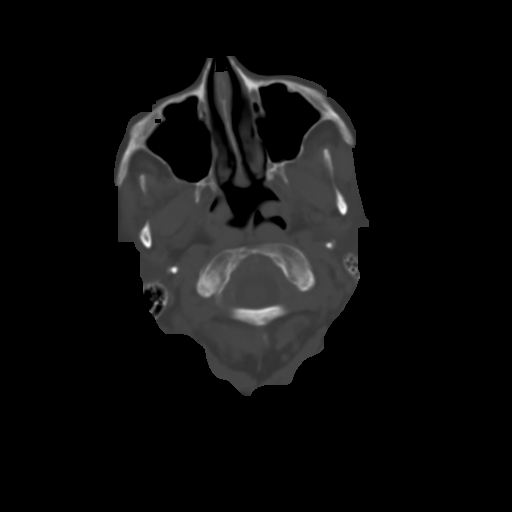
[im 6/31  brain]
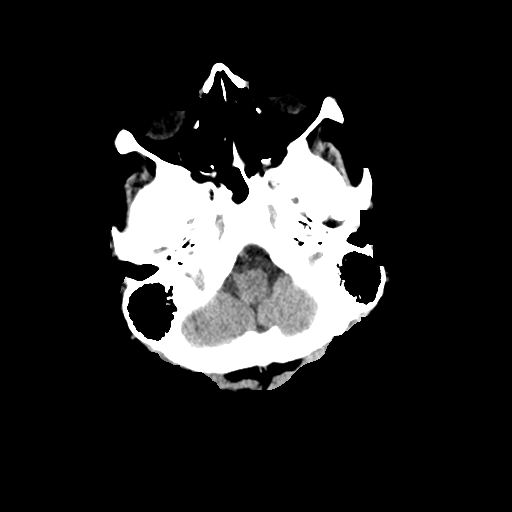
[im 9/31  brain]
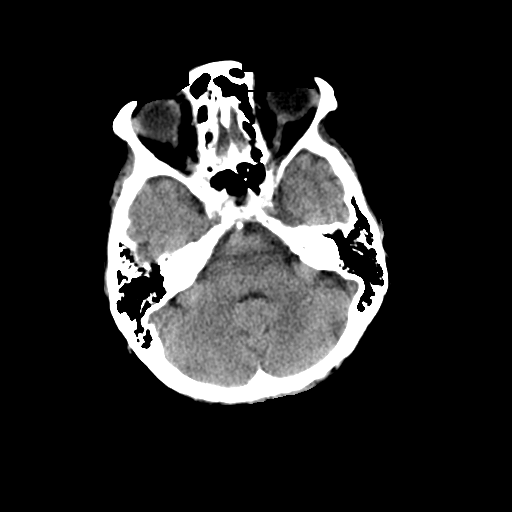
[im 12/31  brain]
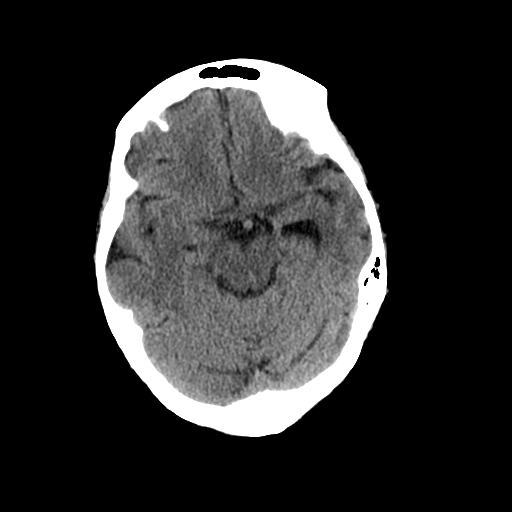
[im 16/31  brain]
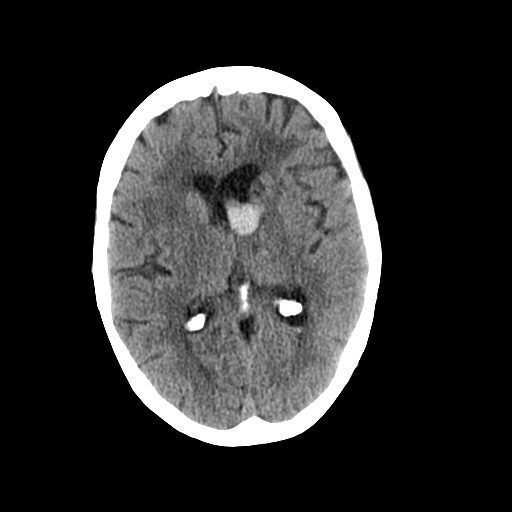
[im 16/31  bone]
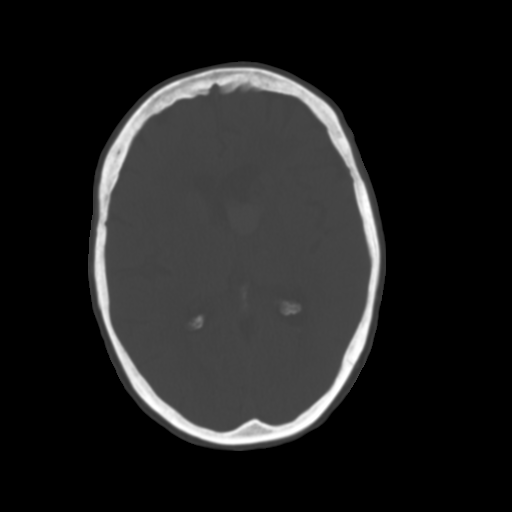
[im 19/31  brain]
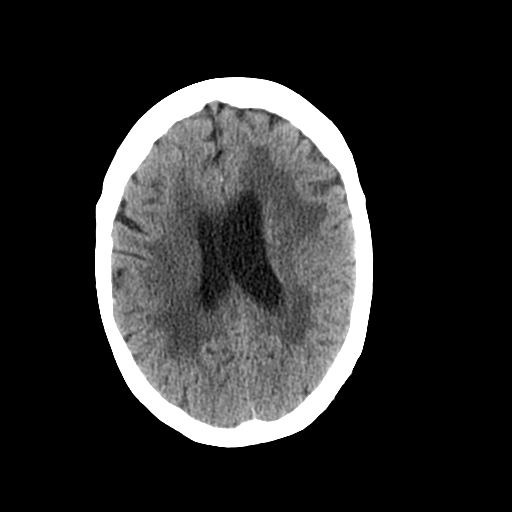
[im 22/31  brain]
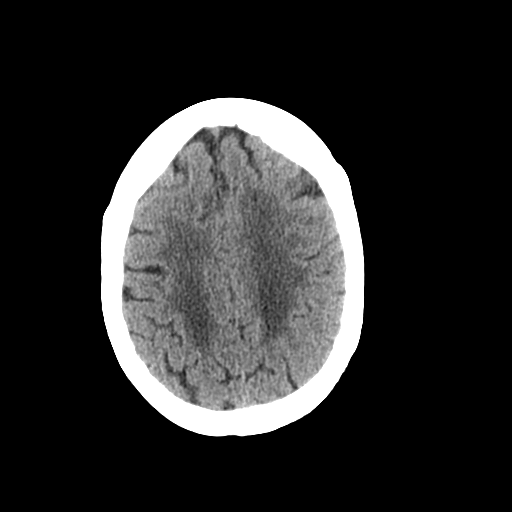
[im 25/31  brain]
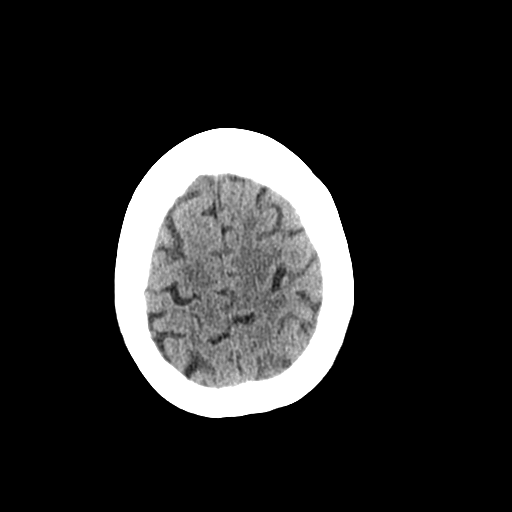
[im 28/31  brain]
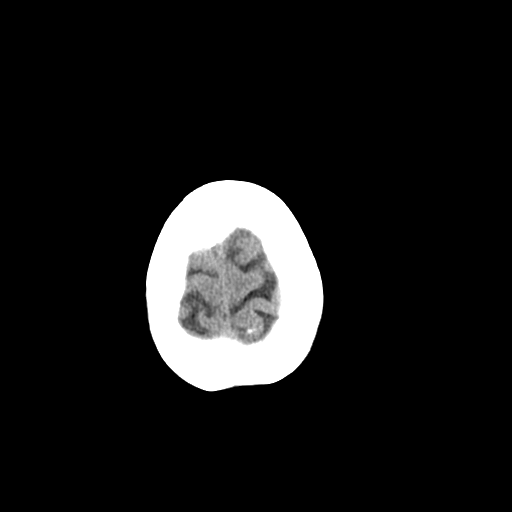
[im 28/31  bone]
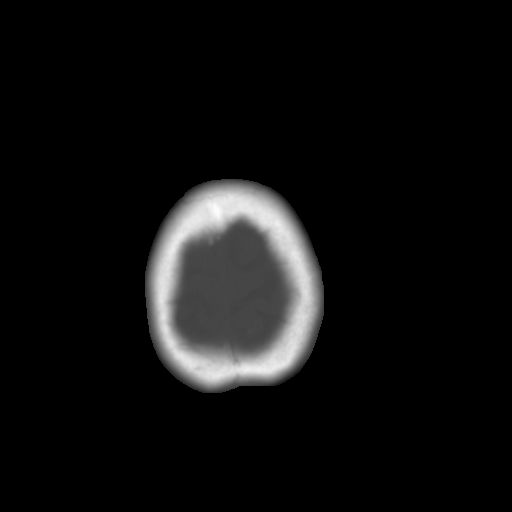

[Series 4: coronal soft tissue · coronal · 0.29mm/px · 3 of 67 slices shown]
[im 23/67  brain]
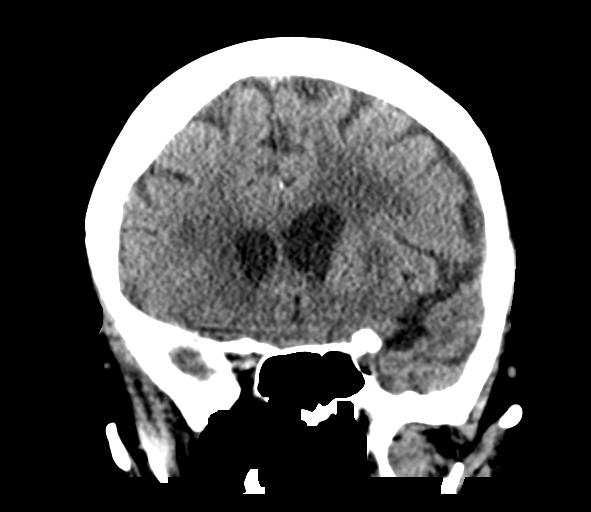
[im 30/67  brain]
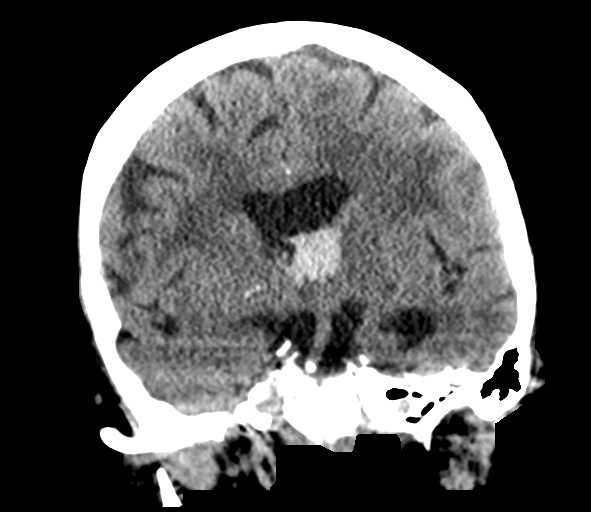
[im 37/67  brain]
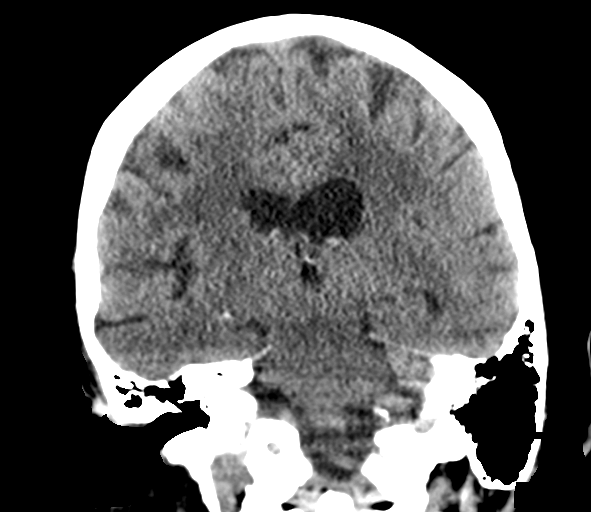

[Series 5: sagittal soft tissue · sagittal · 0.33mm/px · 3 of 51 slices shown]
[im 17/51  brain]
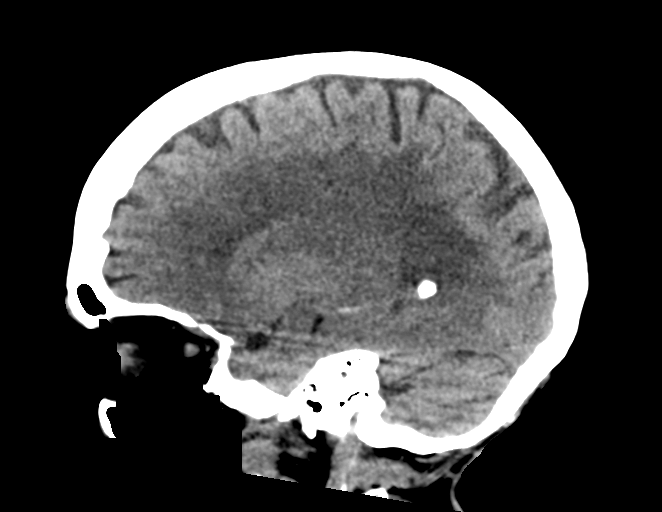
[im 26/51  brain]
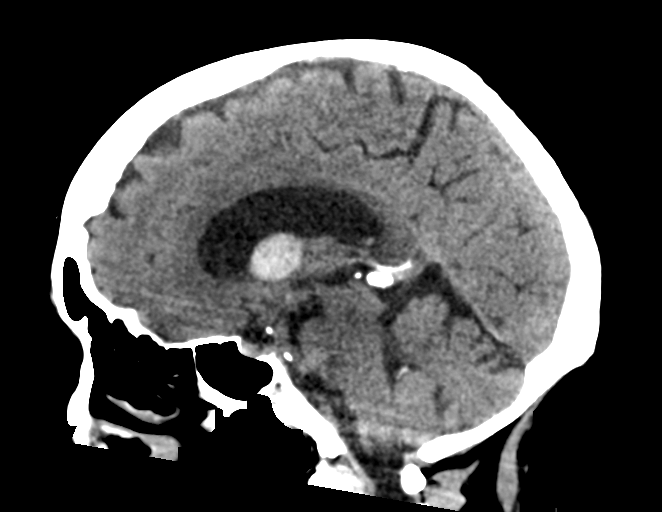
[im 34/51  brain]
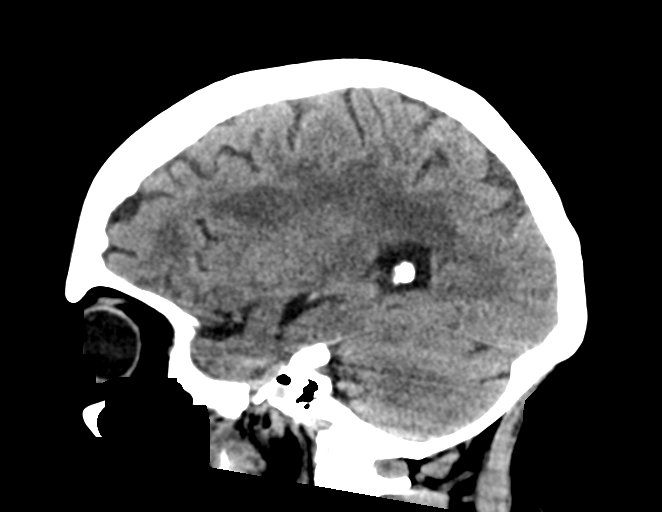

[15 of 47 positions shown; findings below may reference images not displayed]

FINDINGS: Brain: There is moderate age-related atrophy and chronic
microvascular ischemic changes. Acute intracranial hemorrhage
extends from the region of the caudothalamic groove into the left
lateral ventricle and through the left foramen Noorani into the third
ventricle. There is mild dilatation of the left lateral ventricle.
There is approximately 5 mm left-to-right midline shift.

Vascular: No hyperdense vessel or unexpected calcification.

Skull: Normal. Negative for fracture or focal lesion.

Sinuses/Orbits: No acute finding.

Other: None
IMPRESSION: 1. Acute intracranial hemorrhage extending from the region of the
left caudothalamic groove into the left lateral ventricle and
through the left foramen Noorani into the third ventricle. There is
mild dilatation of the left lateral ventricle and approximately 5 mm
left-to-right midline shift. Findings may be hypertensive in nature
or secondary to rupture of A-comm aneurysm. Further evaluation with
CT angiography is recommended.
2. Moderate age-related atrophy and chronic microvascular ischemic
changes.

These results were called by telephone at the time of interpretation
on 03/14/2021 at [DATE] to Dr. Augusti, who verbally acknowledged
these results.

## 2022-10-15 IMAGING — CT CT CHEST W/ CM
2 of 3 series · 15 of 36 positions shown, 18 images · IV contrast (omnipaque)
Comparison: None.

CLINICAL DATA: Slipped and fell. Patient is alert and oriented x4.
Cough for unknown period of time.

EXAM:
CT CHEST WITH CONTRAST
TECHNIQUE: Multidetector CT imaging of the chest was performed during
intravenous contrast administration.
CONTRAST:  80mL OMNIPAQUE IOHEXOL 350 MG/ML SOLN

[Series 14: axial st · axial · 0.75mm/px · z∈[-521,-253]mm · 12 of 158 slices shown, 15 images]
[im 12/158  mediastinal]
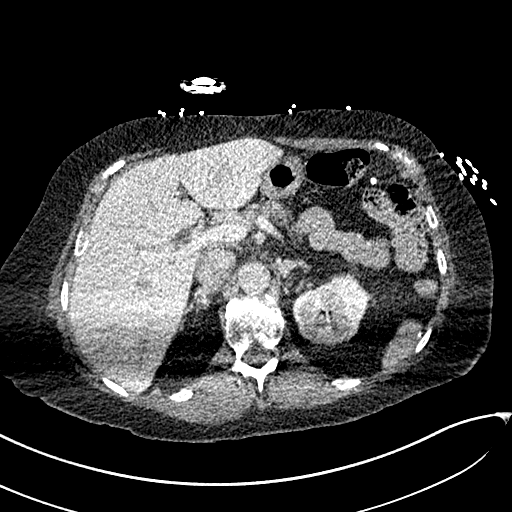
[im 12/158  lung]
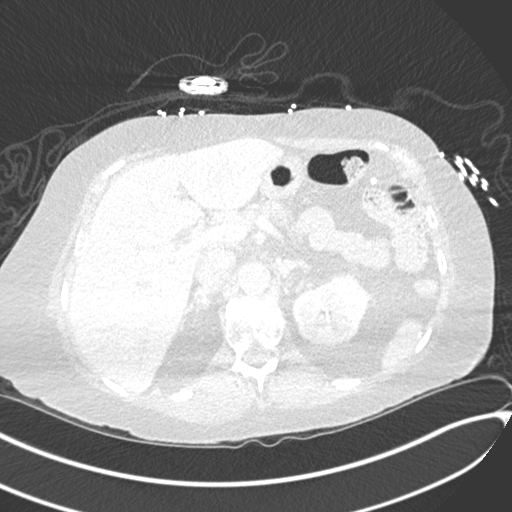
[im 24/158  lung]
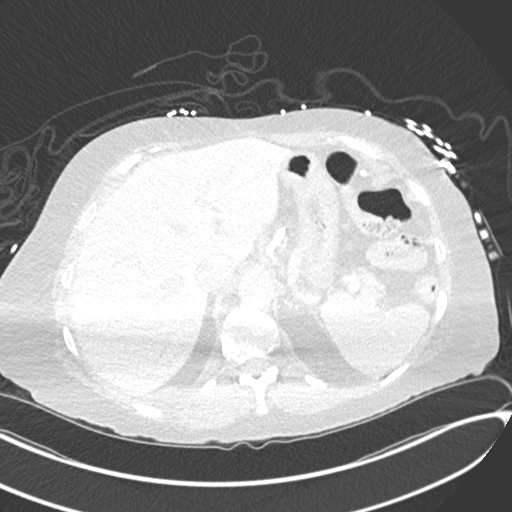
[im 35/158  lung]
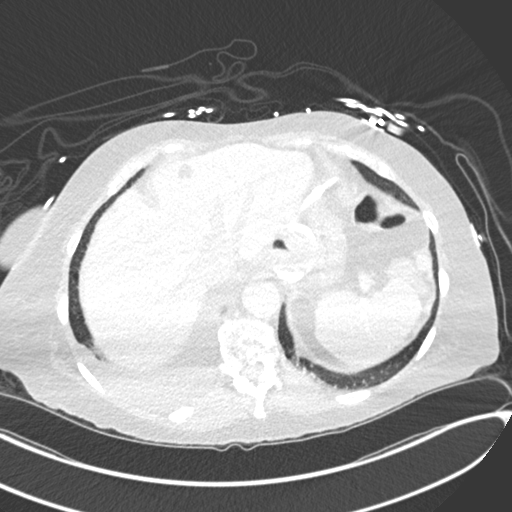
[im 47/158  lung]
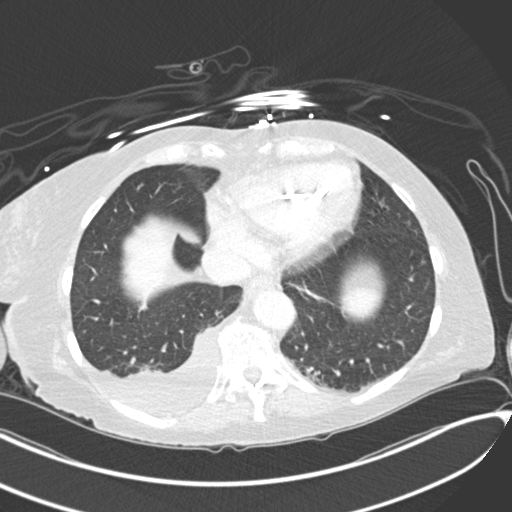
[im 59/158  mediastinal]
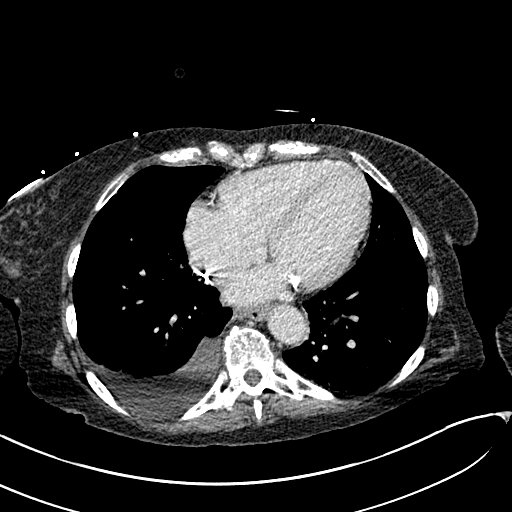
[im 59/158  lung]
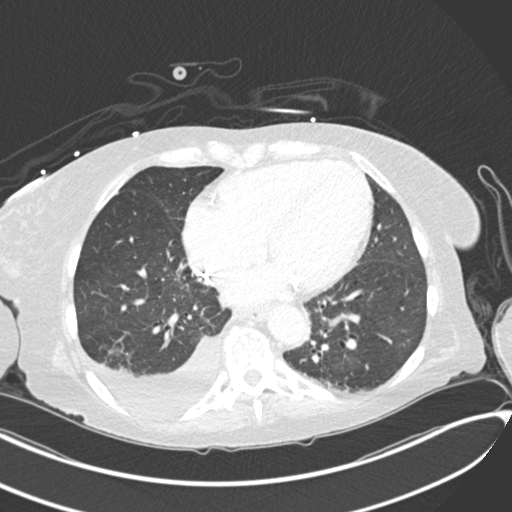
[im 70/158  lung]
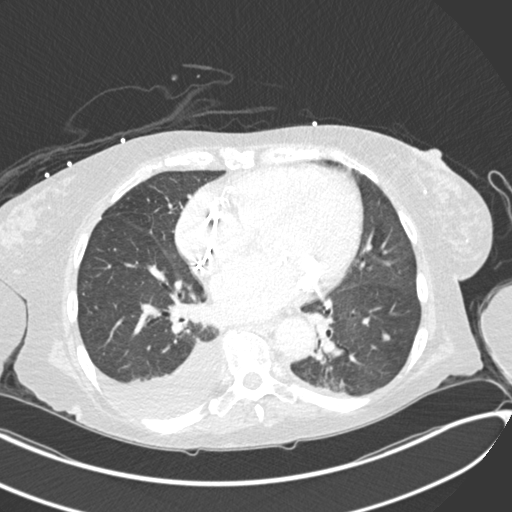
[im 88/158  lung]
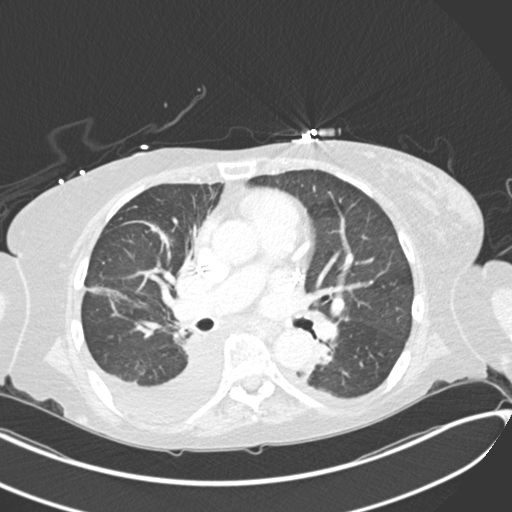
[im 99/158  lung]
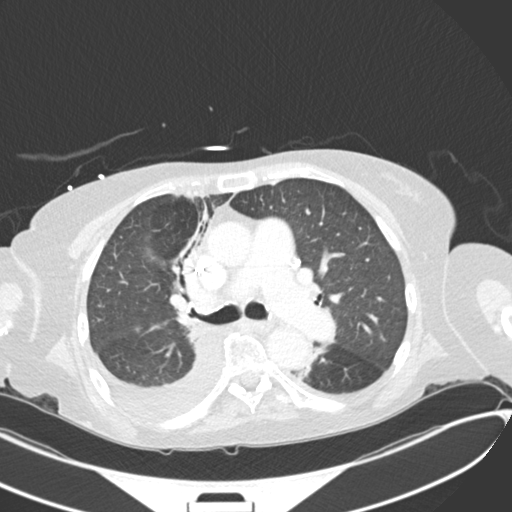
[im 111/158  mediastinal]
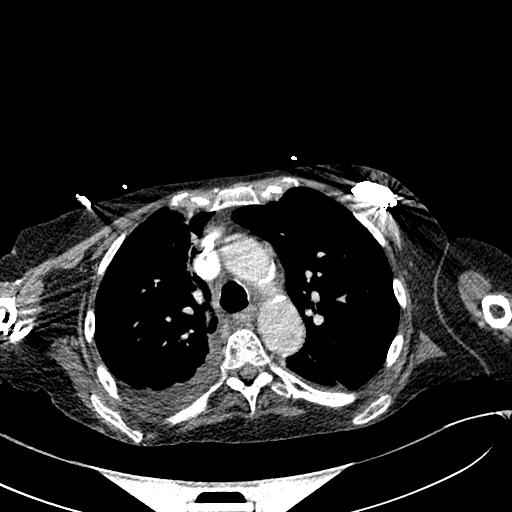
[im 111/158  lung]
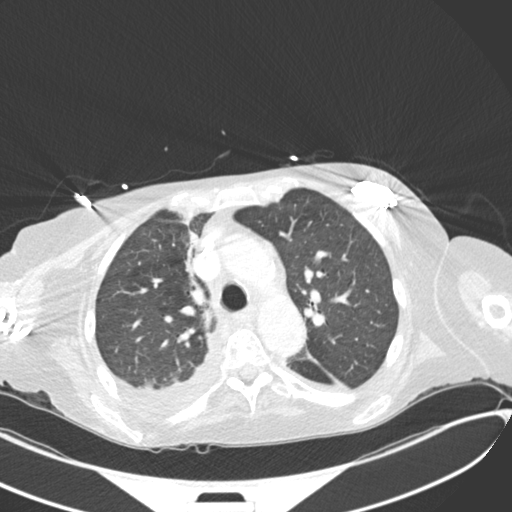
[im 123/158  lung]
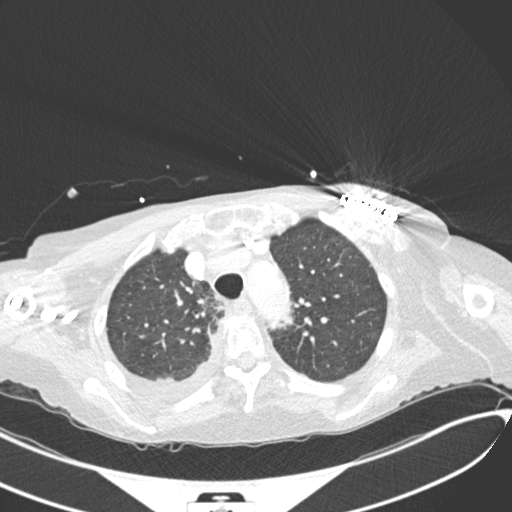
[im 134/158  lung]
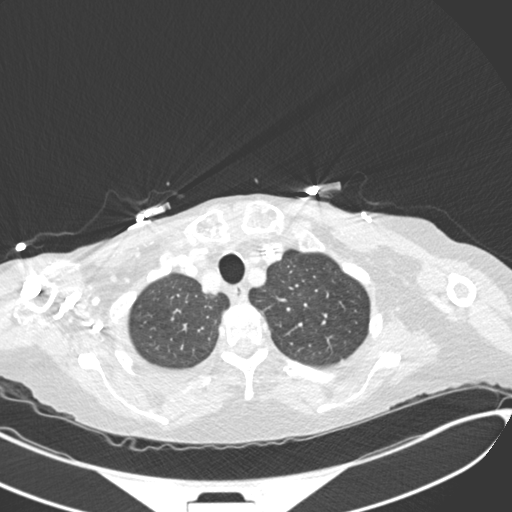
[im 146/158  lung]
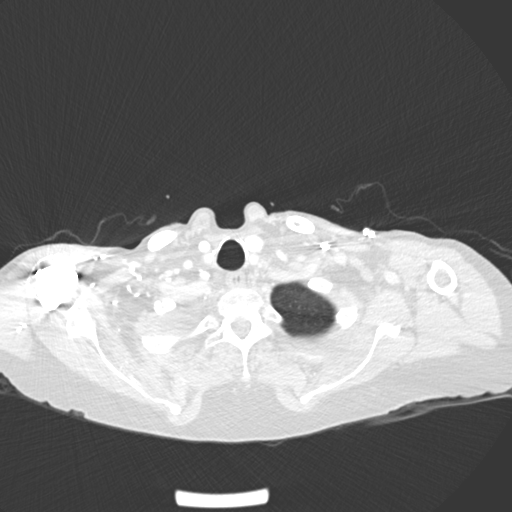

[Series 18: coronal · coronal · 0.57mm/px · 3 of 116 slices shown]
[im 24/116  lung]
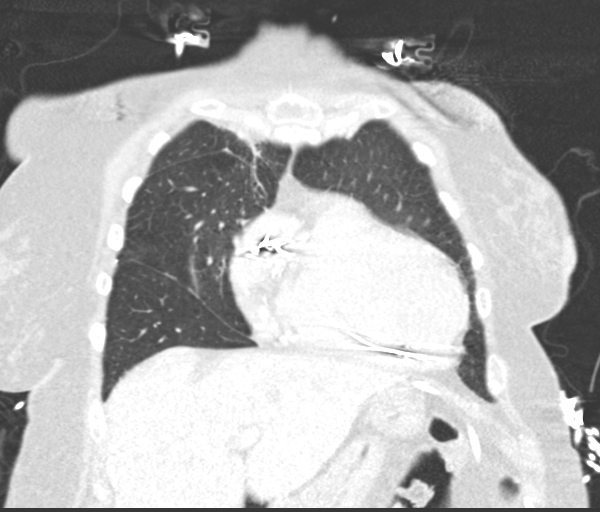
[im 47/116  lung]
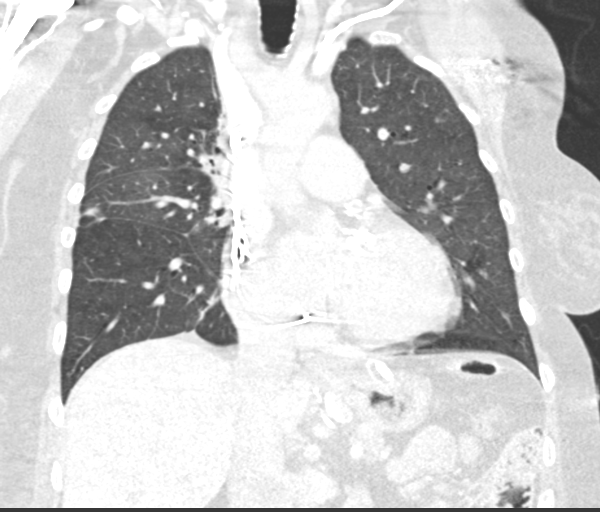
[im 70/116  lung]
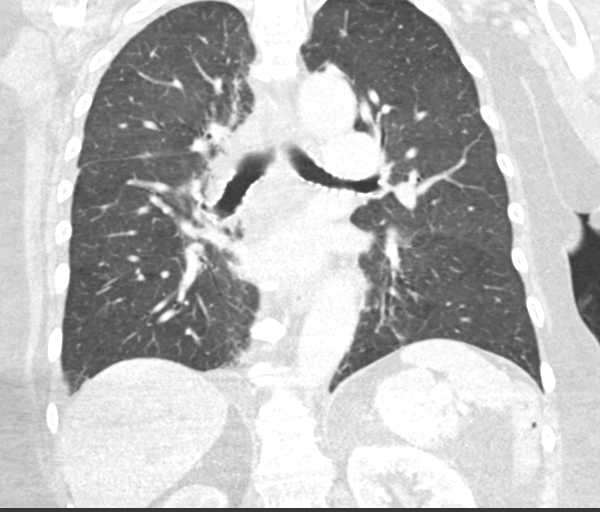

[15 of 36 positions shown; findings below may reference images not displayed]

FINDINGS: Cardiovascular: No significant vascular findings. Normal heart size.
No pericardial effusion. Thoracic aortic atherosclerosis. Multi
vessel coronary artery atherosclerosis. Two lead cardiac pacemaker.

Mediastinum/Nodes: No enlarged mediastinal, hilar, or axillary lymph
nodes. Thyroid gland, trachea, and esophagus demonstrate no
significant findings.

Lungs/Pleura: Small right pleural effusion. Trace left pleural
effusion. No focal consolidation or pneumothorax.

Upper Abdomen: No acute upper abdominal abnormality. 10 mm cyst in
the left hepatic lobe.

Musculoskeletal: No acute osseous abnormality. No aggressive osseous
lesion. Generalized osteopenia. Anterior bridging osteophytes of the
lower thoracic spine. Right shoulder arthroplasty.
IMPRESSION: 1. Small right and trace left pleural effusion.
2.  Aortic Atherosclerosis ([id number]).

## 2022-10-16 IMAGING — CT CT HEAD W/O CM
4 series · 16 of 47 positions shown, 18 images · non-contrast
Comparison: 03/14/2021

CLINICAL DATA: Intracranial hemorrhage, follow-up

EXAM:
CT HEAD WITHOUT CONTRAST
TECHNIQUE: Contiguous axial images were obtained from the base of the skull
through the vertex without intravenous contrast.

[Series 3: head without · axial · non-contrast · 0.43mm/px · z∈[+1340,+1455]mm · 7 of 31 slices shown, 9 images]
[im 4/31  brain]
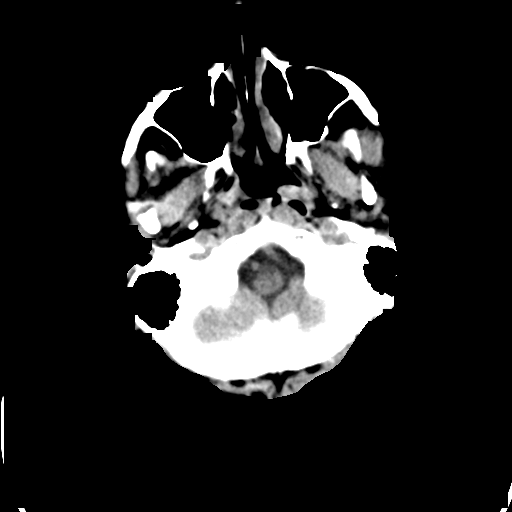
[im 4/31  bone]
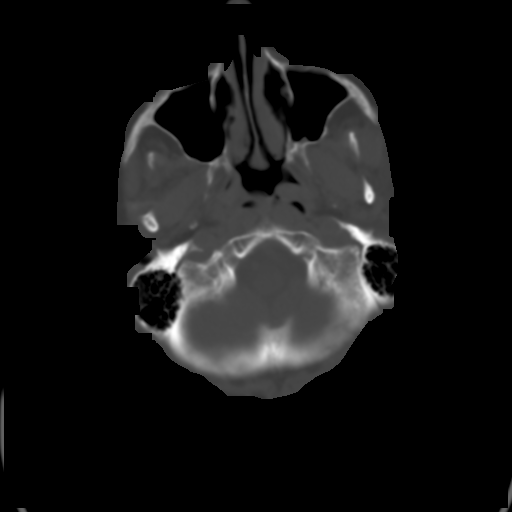
[im 8/31  brain]
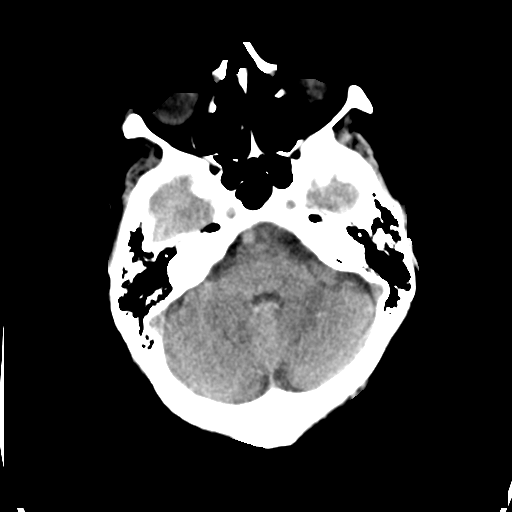
[im 12/31  brain]
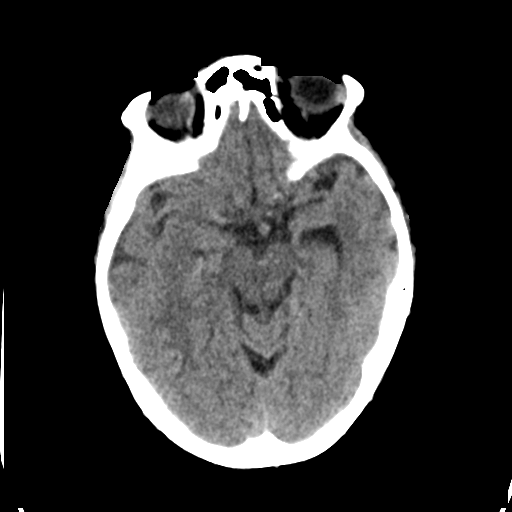
[im 16/31  brain]
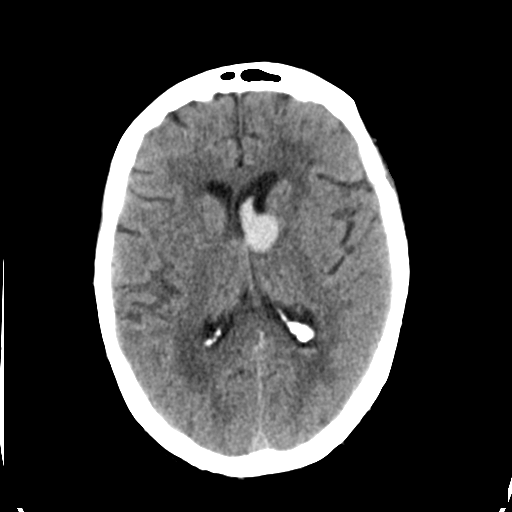
[im 19/31  brain]
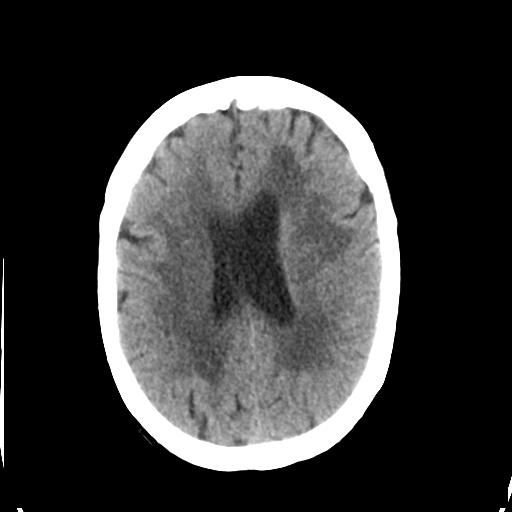
[im 19/31  bone]
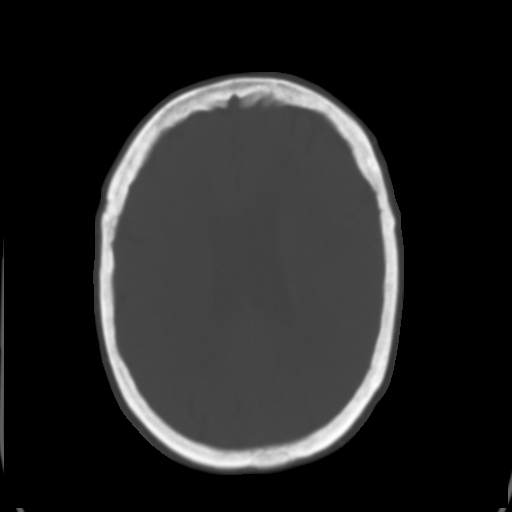
[im 23/31  brain]
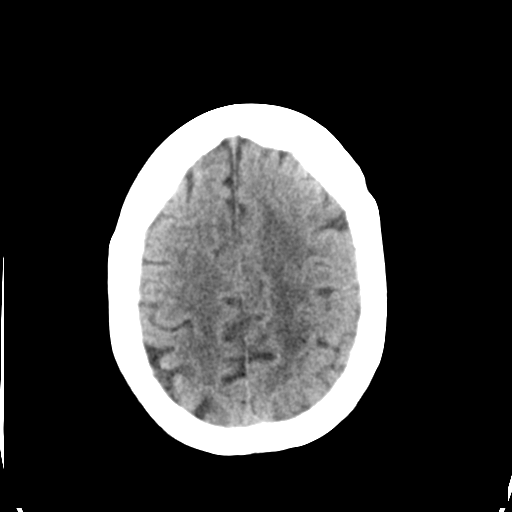
[im 27/31  brain]
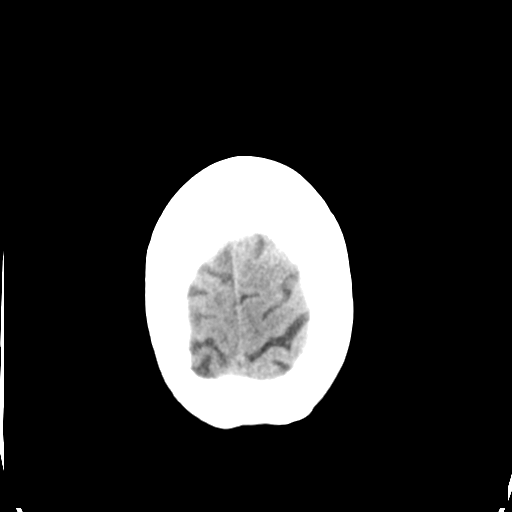

[Series 4: head bone · axial · 0.43mm/px · z∈[+1339,+1369]mm · 3 of 76 slices shown]
[im 8/76  bone]
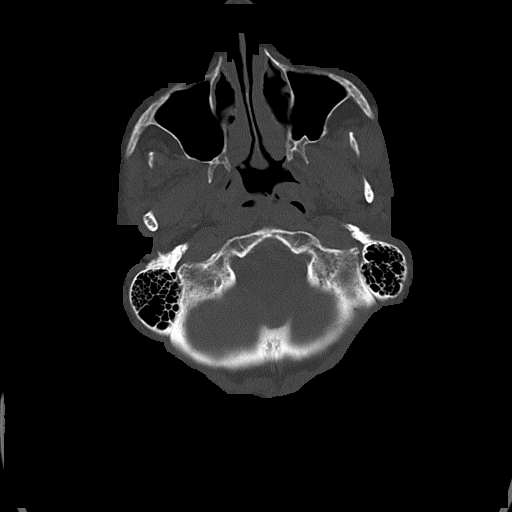
[im 16/76  bone]
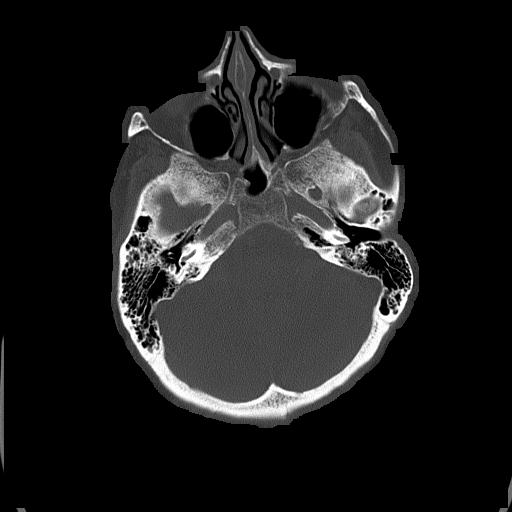
[im 23/76  bone]
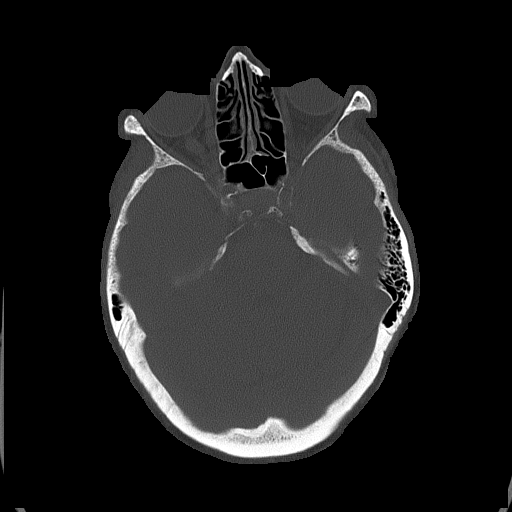

[Series 5: head without cor · coronal · non-contrast · 0.30mm/px · 3 of 67 slices shown]
[im 23/67  brain]
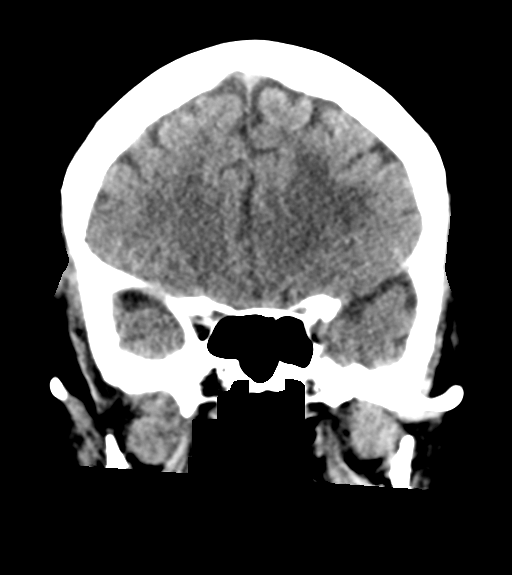
[im 30/67  brain]
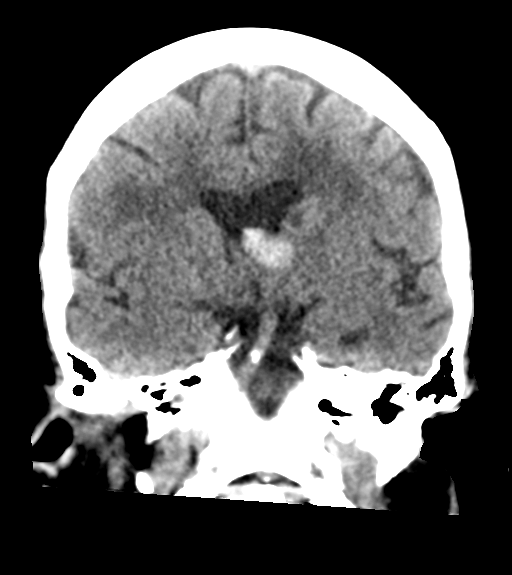
[im 37/67  brain]
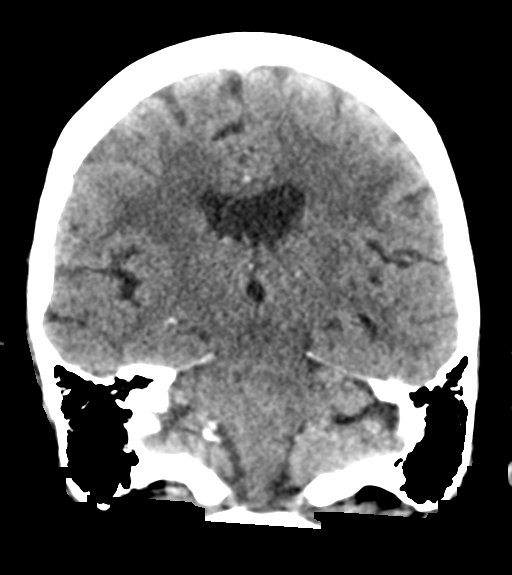

[Series 6: head without sag · sagittal · non-contrast · 0.32mm/px · 3 of 67 slices shown]
[im 23/67  brain]
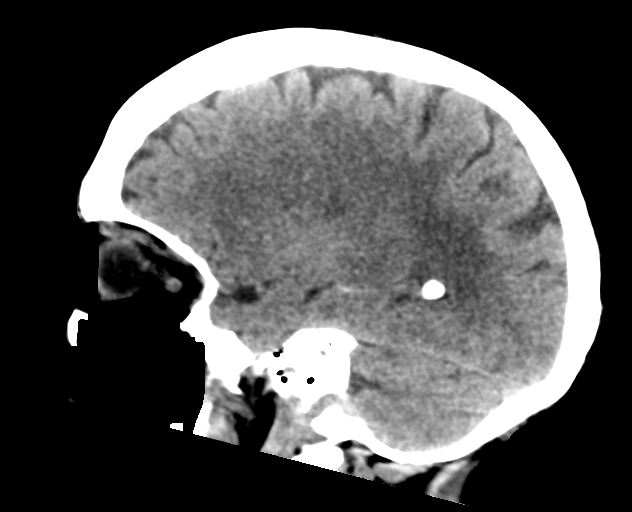
[im 34/67  brain]
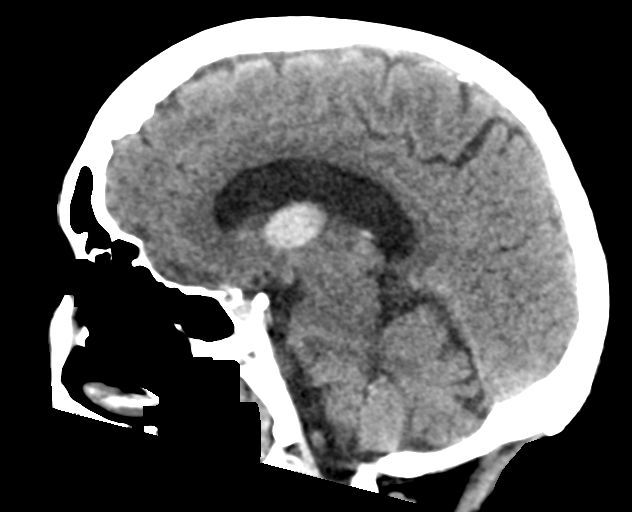
[im 44/67  brain]
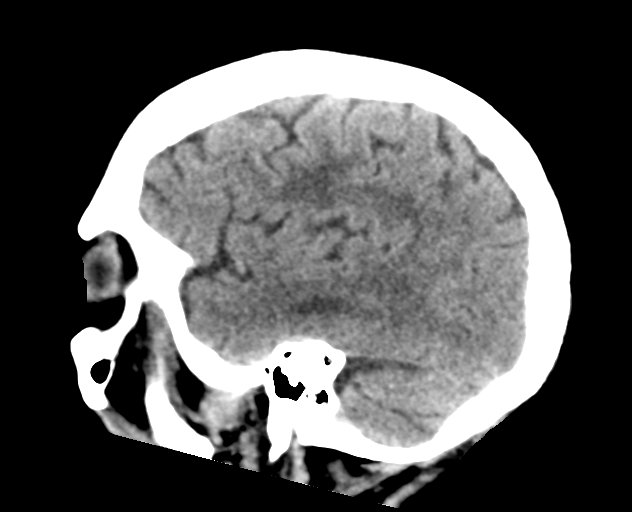

[16 of 47 positions shown; findings below may reference images not displayed]

FINDINGS: Brain: Area of parenchymal hemorrhage is again identified in the
region of the left caudothalamic groove with intraventricular
extension. Mild adjacent edema. No substantial change since the
prior study. No new hemorrhage. There is unchanged mild trapping of
the left lateral ventricle.

No new loss of gray-white differentiation. Stable findings of
probable advanced chronic microvascular ischemic changes. Chronic
infarct of the left caudate head.

Vascular: There is atherosclerotic calcification at the skull base.

Skull: Calvarium is unremarkable.

Sinuses/Orbits: No acute finding.

Other: None.
IMPRESSION: Similar appearance of left caudothalamic hemorrhage with
intraventricular extension and mild adjacent edema. No new
hemorrhage. Unchanged mild trapping of the left lateral ventricle.

## 2022-10-17 DIAGNOSIS — I5030 Unspecified diastolic (congestive) heart failure: Secondary | ICD-10-CM | POA: Diagnosis not present

## 2022-10-17 DIAGNOSIS — I4819 Other persistent atrial fibrillation: Secondary | ICD-10-CM | POA: Diagnosis not present

## 2022-10-17 DIAGNOSIS — I69818 Other symptoms and signs involving cognitive functions following other cerebrovascular disease: Secondary | ICD-10-CM | POA: Diagnosis not present

## 2022-10-17 DIAGNOSIS — D649 Anemia, unspecified: Secondary | ICD-10-CM | POA: Diagnosis not present

## 2022-10-17 DIAGNOSIS — J90 Pleural effusion, not elsewhere classified: Secondary | ICD-10-CM | POA: Diagnosis not present

## 2022-10-17 DIAGNOSIS — I69891 Dysphagia following other cerebrovascular disease: Secondary | ICD-10-CM | POA: Diagnosis not present

## 2022-10-22 DIAGNOSIS — I69818 Other symptoms and signs involving cognitive functions following other cerebrovascular disease: Secondary | ICD-10-CM | POA: Diagnosis not present

## 2022-10-22 DIAGNOSIS — I69891 Dysphagia following other cerebrovascular disease: Secondary | ICD-10-CM | POA: Diagnosis not present

## 2022-10-22 DIAGNOSIS — J90 Pleural effusion, not elsewhere classified: Secondary | ICD-10-CM | POA: Diagnosis not present

## 2022-10-22 DIAGNOSIS — I5030 Unspecified diastolic (congestive) heart failure: Secondary | ICD-10-CM | POA: Diagnosis not present

## 2022-10-22 DIAGNOSIS — I4819 Other persistent atrial fibrillation: Secondary | ICD-10-CM | POA: Diagnosis not present

## 2022-10-22 DIAGNOSIS — D649 Anemia, unspecified: Secondary | ICD-10-CM | POA: Diagnosis not present

## 2022-10-24 DIAGNOSIS — I69818 Other symptoms and signs involving cognitive functions following other cerebrovascular disease: Secondary | ICD-10-CM | POA: Diagnosis not present

## 2022-10-24 DIAGNOSIS — N200 Calculus of kidney: Secondary | ICD-10-CM | POA: Diagnosis not present

## 2022-10-24 DIAGNOSIS — D649 Anemia, unspecified: Secondary | ICD-10-CM | POA: Diagnosis not present

## 2022-10-24 DIAGNOSIS — J690 Pneumonitis due to inhalation of food and vomit: Secondary | ICD-10-CM | POA: Diagnosis not present

## 2022-10-24 DIAGNOSIS — J90 Pleural effusion, not elsewhere classified: Secondary | ICD-10-CM | POA: Diagnosis not present

## 2022-10-24 DIAGNOSIS — I4819 Other persistent atrial fibrillation: Secondary | ICD-10-CM | POA: Diagnosis not present

## 2022-10-24 DIAGNOSIS — K760 Fatty (change of) liver, not elsewhere classified: Secondary | ICD-10-CM | POA: Diagnosis not present

## 2022-10-24 DIAGNOSIS — I69891 Dysphagia following other cerebrovascular disease: Secondary | ICD-10-CM | POA: Diagnosis not present

## 2022-10-24 DIAGNOSIS — I5030 Unspecified diastolic (congestive) heart failure: Secondary | ICD-10-CM | POA: Diagnosis not present

## 2022-10-31 ENCOUNTER — Encounter (HOSPITAL_COMMUNITY): Payer: Self-pay | Admitting: *Deleted

## 2022-11-01 DIAGNOSIS — R059 Cough, unspecified: Secondary | ICD-10-CM | POA: Diagnosis not present

## 2022-11-22 DIAGNOSIS — N809 Endometriosis, unspecified: Secondary | ICD-10-CM | POA: Diagnosis not present

## 2022-11-22 DIAGNOSIS — K635 Polyp of colon: Secondary | ICD-10-CM | POA: Diagnosis not present

## 2022-11-22 DIAGNOSIS — M1990 Unspecified osteoarthritis, unspecified site: Secondary | ICD-10-CM | POA: Diagnosis not present

## 2022-11-22 DIAGNOSIS — I4819 Other persistent atrial fibrillation: Secondary | ICD-10-CM | POA: Diagnosis not present

## 2022-11-22 DIAGNOSIS — R32 Unspecified urinary incontinence: Secondary | ICD-10-CM | POA: Diagnosis not present

## 2022-11-22 DIAGNOSIS — Z8639 Personal history of other endocrine, nutritional and metabolic disease: Secondary | ICD-10-CM | POA: Diagnosis not present

## 2022-11-22 DIAGNOSIS — Z8572 Personal history of non-Hodgkin lymphomas: Secondary | ICD-10-CM | POA: Diagnosis not present

## 2022-11-22 DIAGNOSIS — N200 Calculus of kidney: Secondary | ICD-10-CM | POA: Diagnosis not present

## 2022-11-22 DIAGNOSIS — K760 Fatty (change of) liver, not elsewhere classified: Secondary | ICD-10-CM | POA: Diagnosis not present

## 2022-11-22 DIAGNOSIS — Z741 Need for assistance with personal care: Secondary | ICD-10-CM | POA: Diagnosis not present

## 2022-11-22 DIAGNOSIS — J90 Pleural effusion, not elsewhere classified: Secondary | ICD-10-CM | POA: Diagnosis not present

## 2022-11-22 DIAGNOSIS — Z6823 Body mass index (BMI) 23.0-23.9, adult: Secondary | ICD-10-CM | POA: Diagnosis not present

## 2022-11-22 DIAGNOSIS — R159 Full incontinence of feces: Secondary | ICD-10-CM | POA: Diagnosis not present

## 2022-11-22 DIAGNOSIS — I69818 Other symptoms and signs involving cognitive functions following other cerebrovascular disease: Secondary | ICD-10-CM | POA: Diagnosis not present

## 2022-11-22 DIAGNOSIS — Z8673 Personal history of transient ischemic attack (TIA), and cerebral infarction without residual deficits: Secondary | ICD-10-CM | POA: Diagnosis not present

## 2022-11-22 DIAGNOSIS — Z9884 Bariatric surgery status: Secondary | ICD-10-CM | POA: Diagnosis not present

## 2022-11-22 DIAGNOSIS — I69891 Dysphagia following other cerebrovascular disease: Secondary | ICD-10-CM | POA: Diagnosis not present

## 2022-11-22 DIAGNOSIS — Z853 Personal history of malignant neoplasm of breast: Secondary | ICD-10-CM | POA: Diagnosis not present

## 2022-11-22 DIAGNOSIS — I5030 Unspecified diastolic (congestive) heart failure: Secondary | ICD-10-CM | POA: Diagnosis not present

## 2022-11-22 DIAGNOSIS — Z90710 Acquired absence of both cervix and uterus: Secondary | ICD-10-CM | POA: Diagnosis not present

## 2022-11-22 DIAGNOSIS — Z85118 Personal history of other malignant neoplasm of bronchus and lung: Secondary | ICD-10-CM | POA: Diagnosis not present

## 2022-11-22 DIAGNOSIS — F32A Depression, unspecified: Secondary | ICD-10-CM | POA: Diagnosis not present

## 2022-11-22 DIAGNOSIS — Z9981 Dependence on supplemental oxygen: Secondary | ICD-10-CM | POA: Diagnosis not present

## 2022-11-22 DIAGNOSIS — J690 Pneumonitis due to inhalation of food and vomit: Secondary | ICD-10-CM | POA: Diagnosis not present

## 2022-11-22 DIAGNOSIS — D649 Anemia, unspecified: Secondary | ICD-10-CM | POA: Diagnosis not present

## 2022-11-25 DIAGNOSIS — I4819 Other persistent atrial fibrillation: Secondary | ICD-10-CM | POA: Diagnosis not present

## 2022-11-25 DIAGNOSIS — I5030 Unspecified diastolic (congestive) heart failure: Secondary | ICD-10-CM | POA: Diagnosis not present

## 2022-11-25 DIAGNOSIS — I69891 Dysphagia following other cerebrovascular disease: Secondary | ICD-10-CM | POA: Diagnosis not present

## 2022-11-25 DIAGNOSIS — I69818 Other symptoms and signs involving cognitive functions following other cerebrovascular disease: Secondary | ICD-10-CM | POA: Diagnosis not present

## 2022-11-25 DIAGNOSIS — D649 Anemia, unspecified: Secondary | ICD-10-CM | POA: Diagnosis not present

## 2022-11-25 DIAGNOSIS — J90 Pleural effusion, not elsewhere classified: Secondary | ICD-10-CM | POA: Diagnosis not present

## 2022-11-26 DIAGNOSIS — D649 Anemia, unspecified: Secondary | ICD-10-CM | POA: Diagnosis not present

## 2022-11-26 DIAGNOSIS — J90 Pleural effusion, not elsewhere classified: Secondary | ICD-10-CM | POA: Diagnosis not present

## 2022-11-26 DIAGNOSIS — I69891 Dysphagia following other cerebrovascular disease: Secondary | ICD-10-CM | POA: Diagnosis not present

## 2022-11-26 DIAGNOSIS — I4819 Other persistent atrial fibrillation: Secondary | ICD-10-CM | POA: Diagnosis not present

## 2022-11-26 DIAGNOSIS — I5030 Unspecified diastolic (congestive) heart failure: Secondary | ICD-10-CM | POA: Diagnosis not present

## 2022-11-26 DIAGNOSIS — I69818 Other symptoms and signs involving cognitive functions following other cerebrovascular disease: Secondary | ICD-10-CM | POA: Diagnosis not present

## 2022-11-27 DIAGNOSIS — I4819 Other persistent atrial fibrillation: Secondary | ICD-10-CM | POA: Diagnosis not present

## 2022-11-27 DIAGNOSIS — I69891 Dysphagia following other cerebrovascular disease: Secondary | ICD-10-CM | POA: Diagnosis not present

## 2022-11-27 DIAGNOSIS — I69818 Other symptoms and signs involving cognitive functions following other cerebrovascular disease: Secondary | ICD-10-CM | POA: Diagnosis not present

## 2022-11-27 DIAGNOSIS — J90 Pleural effusion, not elsewhere classified: Secondary | ICD-10-CM | POA: Diagnosis not present

## 2022-11-27 DIAGNOSIS — D649 Anemia, unspecified: Secondary | ICD-10-CM | POA: Diagnosis not present

## 2022-11-27 DIAGNOSIS — I5030 Unspecified diastolic (congestive) heart failure: Secondary | ICD-10-CM | POA: Diagnosis not present

## 2022-11-28 DIAGNOSIS — I5030 Unspecified diastolic (congestive) heart failure: Secondary | ICD-10-CM | POA: Diagnosis not present

## 2022-11-28 DIAGNOSIS — I69818 Other symptoms and signs involving cognitive functions following other cerebrovascular disease: Secondary | ICD-10-CM | POA: Diagnosis not present

## 2022-11-28 DIAGNOSIS — I69891 Dysphagia following other cerebrovascular disease: Secondary | ICD-10-CM | POA: Diagnosis not present

## 2022-11-28 DIAGNOSIS — J90 Pleural effusion, not elsewhere classified: Secondary | ICD-10-CM | POA: Diagnosis not present

## 2022-11-28 DIAGNOSIS — D649 Anemia, unspecified: Secondary | ICD-10-CM | POA: Diagnosis not present

## 2022-11-28 DIAGNOSIS — I4819 Other persistent atrial fibrillation: Secondary | ICD-10-CM | POA: Diagnosis not present

## 2022-12-03 DIAGNOSIS — D649 Anemia, unspecified: Secondary | ICD-10-CM | POA: Diagnosis not present

## 2022-12-03 DIAGNOSIS — I5030 Unspecified diastolic (congestive) heart failure: Secondary | ICD-10-CM | POA: Diagnosis not present

## 2022-12-03 DIAGNOSIS — I4819 Other persistent atrial fibrillation: Secondary | ICD-10-CM | POA: Diagnosis not present

## 2022-12-03 DIAGNOSIS — I69818 Other symptoms and signs involving cognitive functions following other cerebrovascular disease: Secondary | ICD-10-CM | POA: Diagnosis not present

## 2022-12-03 DIAGNOSIS — I69891 Dysphagia following other cerebrovascular disease: Secondary | ICD-10-CM | POA: Diagnosis not present

## 2022-12-03 DIAGNOSIS — J90 Pleural effusion, not elsewhere classified: Secondary | ICD-10-CM | POA: Diagnosis not present

## 2022-12-04 DIAGNOSIS — R0902 Hypoxemia: Secondary | ICD-10-CM | POA: Diagnosis not present

## 2022-12-04 DIAGNOSIS — E039 Hypothyroidism, unspecified: Secondary | ICD-10-CM | POA: Diagnosis not present

## 2022-12-04 DIAGNOSIS — F32A Depression, unspecified: Secondary | ICD-10-CM | POA: Diagnosis not present

## 2022-12-04 DIAGNOSIS — I951 Orthostatic hypotension: Secondary | ICD-10-CM | POA: Diagnosis not present

## 2022-12-04 DIAGNOSIS — Z515 Encounter for palliative care: Secondary | ICD-10-CM | POA: Diagnosis not present

## 2022-12-04 DIAGNOSIS — E785 Hyperlipidemia, unspecified: Secondary | ICD-10-CM | POA: Diagnosis not present

## 2022-12-05 DIAGNOSIS — J90 Pleural effusion, not elsewhere classified: Secondary | ICD-10-CM | POA: Diagnosis not present

## 2022-12-05 DIAGNOSIS — I69891 Dysphagia following other cerebrovascular disease: Secondary | ICD-10-CM | POA: Diagnosis not present

## 2022-12-05 DIAGNOSIS — I4819 Other persistent atrial fibrillation: Secondary | ICD-10-CM | POA: Diagnosis not present

## 2022-12-05 DIAGNOSIS — D649 Anemia, unspecified: Secondary | ICD-10-CM | POA: Diagnosis not present

## 2022-12-05 DIAGNOSIS — I5030 Unspecified diastolic (congestive) heart failure: Secondary | ICD-10-CM | POA: Diagnosis not present

## 2022-12-05 DIAGNOSIS — I69818 Other symptoms and signs involving cognitive functions following other cerebrovascular disease: Secondary | ICD-10-CM | POA: Diagnosis not present

## 2022-12-10 DIAGNOSIS — D649 Anemia, unspecified: Secondary | ICD-10-CM | POA: Diagnosis not present

## 2022-12-10 DIAGNOSIS — I4819 Other persistent atrial fibrillation: Secondary | ICD-10-CM | POA: Diagnosis not present

## 2022-12-10 DIAGNOSIS — I69891 Dysphagia following other cerebrovascular disease: Secondary | ICD-10-CM | POA: Diagnosis not present

## 2022-12-10 DIAGNOSIS — J90 Pleural effusion, not elsewhere classified: Secondary | ICD-10-CM | POA: Diagnosis not present

## 2022-12-10 DIAGNOSIS — I5030 Unspecified diastolic (congestive) heart failure: Secondary | ICD-10-CM | POA: Diagnosis not present

## 2022-12-10 DIAGNOSIS — I69818 Other symptoms and signs involving cognitive functions following other cerebrovascular disease: Secondary | ICD-10-CM | POA: Diagnosis not present

## 2022-12-12 DIAGNOSIS — I69891 Dysphagia following other cerebrovascular disease: Secondary | ICD-10-CM | POA: Diagnosis not present

## 2022-12-12 DIAGNOSIS — J90 Pleural effusion, not elsewhere classified: Secondary | ICD-10-CM | POA: Diagnosis not present

## 2022-12-12 DIAGNOSIS — I4819 Other persistent atrial fibrillation: Secondary | ICD-10-CM | POA: Diagnosis not present

## 2022-12-12 DIAGNOSIS — I69818 Other symptoms and signs involving cognitive functions following other cerebrovascular disease: Secondary | ICD-10-CM | POA: Diagnosis not present

## 2022-12-12 DIAGNOSIS — I5030 Unspecified diastolic (congestive) heart failure: Secondary | ICD-10-CM | POA: Diagnosis not present

## 2022-12-12 DIAGNOSIS — D649 Anemia, unspecified: Secondary | ICD-10-CM | POA: Diagnosis not present

## 2022-12-17 DIAGNOSIS — J90 Pleural effusion, not elsewhere classified: Secondary | ICD-10-CM | POA: Diagnosis not present

## 2022-12-17 DIAGNOSIS — D649 Anemia, unspecified: Secondary | ICD-10-CM | POA: Diagnosis not present

## 2022-12-17 DIAGNOSIS — I4819 Other persistent atrial fibrillation: Secondary | ICD-10-CM | POA: Diagnosis not present

## 2022-12-17 DIAGNOSIS — I69818 Other symptoms and signs involving cognitive functions following other cerebrovascular disease: Secondary | ICD-10-CM | POA: Diagnosis not present

## 2022-12-17 DIAGNOSIS — I69891 Dysphagia following other cerebrovascular disease: Secondary | ICD-10-CM | POA: Diagnosis not present

## 2022-12-17 DIAGNOSIS — I5030 Unspecified diastolic (congestive) heart failure: Secondary | ICD-10-CM | POA: Diagnosis not present

## 2022-12-18 DIAGNOSIS — Z961 Presence of intraocular lens: Secondary | ICD-10-CM | POA: Diagnosis not present

## 2022-12-18 DIAGNOSIS — H04123 Dry eye syndrome of bilateral lacrimal glands: Secondary | ICD-10-CM | POA: Diagnosis not present

## 2022-12-19 DIAGNOSIS — D649 Anemia, unspecified: Secondary | ICD-10-CM | POA: Diagnosis not present

## 2022-12-19 DIAGNOSIS — I4819 Other persistent atrial fibrillation: Secondary | ICD-10-CM | POA: Diagnosis not present

## 2022-12-19 DIAGNOSIS — I69891 Dysphagia following other cerebrovascular disease: Secondary | ICD-10-CM | POA: Diagnosis not present

## 2022-12-19 DIAGNOSIS — I69818 Other symptoms and signs involving cognitive functions following other cerebrovascular disease: Secondary | ICD-10-CM | POA: Diagnosis not present

## 2022-12-19 DIAGNOSIS — J90 Pleural effusion, not elsewhere classified: Secondary | ICD-10-CM | POA: Diagnosis not present

## 2022-12-19 DIAGNOSIS — I5030 Unspecified diastolic (congestive) heart failure: Secondary | ICD-10-CM | POA: Diagnosis not present

## 2022-12-23 DIAGNOSIS — Z6823 Body mass index (BMI) 23.0-23.9, adult: Secondary | ICD-10-CM | POA: Diagnosis not present

## 2022-12-23 DIAGNOSIS — M1990 Unspecified osteoarthritis, unspecified site: Secondary | ICD-10-CM | POA: Diagnosis not present

## 2022-12-23 DIAGNOSIS — Z8673 Personal history of transient ischemic attack (TIA), and cerebral infarction without residual deficits: Secondary | ICD-10-CM | POA: Diagnosis not present

## 2022-12-23 DIAGNOSIS — Z85118 Personal history of other malignant neoplasm of bronchus and lung: Secondary | ICD-10-CM | POA: Diagnosis not present

## 2022-12-23 DIAGNOSIS — K760 Fatty (change of) liver, not elsewhere classified: Secondary | ICD-10-CM | POA: Diagnosis not present

## 2022-12-23 DIAGNOSIS — I69818 Other symptoms and signs involving cognitive functions following other cerebrovascular disease: Secondary | ICD-10-CM | POA: Diagnosis not present

## 2022-12-23 DIAGNOSIS — N809 Endometriosis, unspecified: Secondary | ICD-10-CM | POA: Diagnosis not present

## 2022-12-23 DIAGNOSIS — R159 Full incontinence of feces: Secondary | ICD-10-CM | POA: Diagnosis not present

## 2022-12-23 DIAGNOSIS — K635 Polyp of colon: Secondary | ICD-10-CM | POA: Diagnosis not present

## 2022-12-23 DIAGNOSIS — Z741 Need for assistance with personal care: Secondary | ICD-10-CM | POA: Diagnosis not present

## 2022-12-23 DIAGNOSIS — R32 Unspecified urinary incontinence: Secondary | ICD-10-CM | POA: Diagnosis not present

## 2022-12-23 DIAGNOSIS — I5030 Unspecified diastolic (congestive) heart failure: Secondary | ICD-10-CM | POA: Diagnosis not present

## 2022-12-23 DIAGNOSIS — Z8639 Personal history of other endocrine, nutritional and metabolic disease: Secondary | ICD-10-CM | POA: Diagnosis not present

## 2022-12-23 DIAGNOSIS — Z9884 Bariatric surgery status: Secondary | ICD-10-CM | POA: Diagnosis not present

## 2022-12-23 DIAGNOSIS — I4819 Other persistent atrial fibrillation: Secondary | ICD-10-CM | POA: Diagnosis not present

## 2022-12-23 DIAGNOSIS — D649 Anemia, unspecified: Secondary | ICD-10-CM | POA: Diagnosis not present

## 2022-12-23 DIAGNOSIS — Z8572 Personal history of non-Hodgkin lymphomas: Secondary | ICD-10-CM | POA: Diagnosis not present

## 2022-12-23 DIAGNOSIS — I69891 Dysphagia following other cerebrovascular disease: Secondary | ICD-10-CM | POA: Diagnosis not present

## 2022-12-23 DIAGNOSIS — R296 Repeated falls: Secondary | ICD-10-CM | POA: Diagnosis not present

## 2022-12-23 DIAGNOSIS — J90 Pleural effusion, not elsewhere classified: Secondary | ICD-10-CM | POA: Diagnosis not present

## 2022-12-23 DIAGNOSIS — Z853 Personal history of malignant neoplasm of breast: Secondary | ICD-10-CM | POA: Diagnosis not present

## 2022-12-23 DIAGNOSIS — F32A Depression, unspecified: Secondary | ICD-10-CM | POA: Diagnosis not present

## 2022-12-23 DIAGNOSIS — Z90710 Acquired absence of both cervix and uterus: Secondary | ICD-10-CM | POA: Diagnosis not present

## 2022-12-23 DIAGNOSIS — J690 Pneumonitis due to inhalation of food and vomit: Secondary | ICD-10-CM | POA: Diagnosis not present

## 2022-12-23 DIAGNOSIS — N200 Calculus of kidney: Secondary | ICD-10-CM | POA: Diagnosis not present

## 2022-12-24 DIAGNOSIS — I4891 Unspecified atrial fibrillation: Secondary | ICD-10-CM | POA: Diagnosis not present

## 2022-12-24 DIAGNOSIS — I69818 Other symptoms and signs involving cognitive functions following other cerebrovascular disease: Secondary | ICD-10-CM | POA: Diagnosis not present

## 2022-12-24 DIAGNOSIS — I4819 Other persistent atrial fibrillation: Secondary | ICD-10-CM | POA: Diagnosis not present

## 2022-12-24 DIAGNOSIS — F02A4 Dementia in other diseases classified elsewhere, mild, with anxiety: Secondary | ICD-10-CM | POA: Diagnosis not present

## 2022-12-24 DIAGNOSIS — D649 Anemia, unspecified: Secondary | ICD-10-CM | POA: Diagnosis not present

## 2022-12-24 DIAGNOSIS — I5030 Unspecified diastolic (congestive) heart failure: Secondary | ICD-10-CM | POA: Diagnosis not present

## 2022-12-24 DIAGNOSIS — R0989 Other specified symptoms and signs involving the circulatory and respiratory systems: Secondary | ICD-10-CM | POA: Diagnosis not present

## 2022-12-24 DIAGNOSIS — F02A3 Dementia in other diseases classified elsewhere, mild, with mood disturbance: Secondary | ICD-10-CM | POA: Diagnosis not present

## 2022-12-24 DIAGNOSIS — E039 Hypothyroidism, unspecified: Secondary | ICD-10-CM | POA: Diagnosis not present

## 2022-12-24 DIAGNOSIS — I69891 Dysphagia following other cerebrovascular disease: Secondary | ICD-10-CM | POA: Diagnosis not present

## 2022-12-24 DIAGNOSIS — J90 Pleural effusion, not elsewhere classified: Secondary | ICD-10-CM | POA: Diagnosis not present

## 2022-12-24 DIAGNOSIS — I951 Orthostatic hypotension: Secondary | ICD-10-CM | POA: Diagnosis not present

## 2022-12-24 DIAGNOSIS — L600 Ingrowing nail: Secondary | ICD-10-CM | POA: Diagnosis not present

## 2022-12-25 DIAGNOSIS — C8590 Non-Hodgkin lymphoma, unspecified, unspecified site: Secondary | ICD-10-CM | POA: Diagnosis not present

## 2022-12-25 DIAGNOSIS — E46 Unspecified protein-calorie malnutrition: Secondary | ICD-10-CM | POA: Diagnosis not present

## 2022-12-25 DIAGNOSIS — F39 Unspecified mood [affective] disorder: Secondary | ICD-10-CM | POA: Diagnosis not present

## 2022-12-25 DIAGNOSIS — I951 Orthostatic hypotension: Secondary | ICD-10-CM | POA: Diagnosis not present

## 2022-12-25 DIAGNOSIS — E039 Hypothyroidism, unspecified: Secondary | ICD-10-CM | POA: Diagnosis not present

## 2022-12-25 DIAGNOSIS — J432 Centrilobular emphysema: Secondary | ICD-10-CM | POA: Diagnosis not present

## 2022-12-25 DIAGNOSIS — E785 Hyperlipidemia, unspecified: Secondary | ICD-10-CM | POA: Diagnosis not present

## 2022-12-26 DIAGNOSIS — D649 Anemia, unspecified: Secondary | ICD-10-CM | POA: Diagnosis not present

## 2022-12-26 DIAGNOSIS — I69891 Dysphagia following other cerebrovascular disease: Secondary | ICD-10-CM | POA: Diagnosis not present

## 2022-12-26 DIAGNOSIS — I4819 Other persistent atrial fibrillation: Secondary | ICD-10-CM | POA: Diagnosis not present

## 2022-12-26 DIAGNOSIS — J90 Pleural effusion, not elsewhere classified: Secondary | ICD-10-CM | POA: Diagnosis not present

## 2022-12-26 DIAGNOSIS — I5030 Unspecified diastolic (congestive) heart failure: Secondary | ICD-10-CM | POA: Diagnosis not present

## 2022-12-26 DIAGNOSIS — I69818 Other symptoms and signs involving cognitive functions following other cerebrovascular disease: Secondary | ICD-10-CM | POA: Diagnosis not present

## 2022-12-31 DIAGNOSIS — D649 Anemia, unspecified: Secondary | ICD-10-CM | POA: Diagnosis not present

## 2022-12-31 DIAGNOSIS — J90 Pleural effusion, not elsewhere classified: Secondary | ICD-10-CM | POA: Diagnosis not present

## 2022-12-31 DIAGNOSIS — I69818 Other symptoms and signs involving cognitive functions following other cerebrovascular disease: Secondary | ICD-10-CM | POA: Diagnosis not present

## 2022-12-31 DIAGNOSIS — I5030 Unspecified diastolic (congestive) heart failure: Secondary | ICD-10-CM | POA: Diagnosis not present

## 2022-12-31 DIAGNOSIS — I69891 Dysphagia following other cerebrovascular disease: Secondary | ICD-10-CM | POA: Diagnosis not present

## 2022-12-31 DIAGNOSIS — I4819 Other persistent atrial fibrillation: Secondary | ICD-10-CM | POA: Diagnosis not present

## 2023-01-02 DIAGNOSIS — D649 Anemia, unspecified: Secondary | ICD-10-CM | POA: Diagnosis not present

## 2023-01-02 DIAGNOSIS — I69818 Other symptoms and signs involving cognitive functions following other cerebrovascular disease: Secondary | ICD-10-CM | POA: Diagnosis not present

## 2023-01-02 DIAGNOSIS — I69891 Dysphagia following other cerebrovascular disease: Secondary | ICD-10-CM | POA: Diagnosis not present

## 2023-01-02 DIAGNOSIS — I4819 Other persistent atrial fibrillation: Secondary | ICD-10-CM | POA: Diagnosis not present

## 2023-01-02 DIAGNOSIS — I5030 Unspecified diastolic (congestive) heart failure: Secondary | ICD-10-CM | POA: Diagnosis not present

## 2023-01-02 DIAGNOSIS — J90 Pleural effusion, not elsewhere classified: Secondary | ICD-10-CM | POA: Diagnosis not present

## 2023-01-07 DIAGNOSIS — I4819 Other persistent atrial fibrillation: Secondary | ICD-10-CM | POA: Diagnosis not present

## 2023-01-07 DIAGNOSIS — I5030 Unspecified diastolic (congestive) heart failure: Secondary | ICD-10-CM | POA: Diagnosis not present

## 2023-01-07 DIAGNOSIS — I69818 Other symptoms and signs involving cognitive functions following other cerebrovascular disease: Secondary | ICD-10-CM | POA: Diagnosis not present

## 2023-01-07 DIAGNOSIS — J90 Pleural effusion, not elsewhere classified: Secondary | ICD-10-CM | POA: Diagnosis not present

## 2023-01-07 DIAGNOSIS — I69891 Dysphagia following other cerebrovascular disease: Secondary | ICD-10-CM | POA: Diagnosis not present

## 2023-01-07 DIAGNOSIS — D649 Anemia, unspecified: Secondary | ICD-10-CM | POA: Diagnosis not present

## 2023-01-09 DIAGNOSIS — I69891 Dysphagia following other cerebrovascular disease: Secondary | ICD-10-CM | POA: Diagnosis not present

## 2023-01-09 DIAGNOSIS — J90 Pleural effusion, not elsewhere classified: Secondary | ICD-10-CM | POA: Diagnosis not present

## 2023-01-09 DIAGNOSIS — I69818 Other symptoms and signs involving cognitive functions following other cerebrovascular disease: Secondary | ICD-10-CM | POA: Diagnosis not present

## 2023-01-09 DIAGNOSIS — I5030 Unspecified diastolic (congestive) heart failure: Secondary | ICD-10-CM | POA: Diagnosis not present

## 2023-01-09 DIAGNOSIS — D649 Anemia, unspecified: Secondary | ICD-10-CM | POA: Diagnosis not present

## 2023-01-09 DIAGNOSIS — I4819 Other persistent atrial fibrillation: Secondary | ICD-10-CM | POA: Diagnosis not present

## 2023-01-14 DIAGNOSIS — I69891 Dysphagia following other cerebrovascular disease: Secondary | ICD-10-CM | POA: Diagnosis not present

## 2023-01-14 DIAGNOSIS — I4819 Other persistent atrial fibrillation: Secondary | ICD-10-CM | POA: Diagnosis not present

## 2023-01-14 DIAGNOSIS — D649 Anemia, unspecified: Secondary | ICD-10-CM | POA: Diagnosis not present

## 2023-01-14 DIAGNOSIS — I5030 Unspecified diastolic (congestive) heart failure: Secondary | ICD-10-CM | POA: Diagnosis not present

## 2023-01-14 DIAGNOSIS — J90 Pleural effusion, not elsewhere classified: Secondary | ICD-10-CM | POA: Diagnosis not present

## 2023-01-14 DIAGNOSIS — I69818 Other symptoms and signs involving cognitive functions following other cerebrovascular disease: Secondary | ICD-10-CM | POA: Diagnosis not present

## 2023-01-15 DIAGNOSIS — I69891 Dysphagia following other cerebrovascular disease: Secondary | ICD-10-CM | POA: Diagnosis not present

## 2023-01-15 DIAGNOSIS — J90 Pleural effusion, not elsewhere classified: Secondary | ICD-10-CM | POA: Diagnosis not present

## 2023-01-15 DIAGNOSIS — I69818 Other symptoms and signs involving cognitive functions following other cerebrovascular disease: Secondary | ICD-10-CM | POA: Diagnosis not present

## 2023-01-15 DIAGNOSIS — I4819 Other persistent atrial fibrillation: Secondary | ICD-10-CM | POA: Diagnosis not present

## 2023-01-15 DIAGNOSIS — I5030 Unspecified diastolic (congestive) heart failure: Secondary | ICD-10-CM | POA: Diagnosis not present

## 2023-01-15 DIAGNOSIS — D649 Anemia, unspecified: Secondary | ICD-10-CM | POA: Diagnosis not present

## 2023-01-16 DIAGNOSIS — I69891 Dysphagia following other cerebrovascular disease: Secondary | ICD-10-CM | POA: Diagnosis not present

## 2023-01-16 DIAGNOSIS — D649 Anemia, unspecified: Secondary | ICD-10-CM | POA: Diagnosis not present

## 2023-01-16 DIAGNOSIS — I69818 Other symptoms and signs involving cognitive functions following other cerebrovascular disease: Secondary | ICD-10-CM | POA: Diagnosis not present

## 2023-01-16 DIAGNOSIS — I5030 Unspecified diastolic (congestive) heart failure: Secondary | ICD-10-CM | POA: Diagnosis not present

## 2023-01-16 DIAGNOSIS — J90 Pleural effusion, not elsewhere classified: Secondary | ICD-10-CM | POA: Diagnosis not present

## 2023-01-16 DIAGNOSIS — I4819 Other persistent atrial fibrillation: Secondary | ICD-10-CM | POA: Diagnosis not present

## 2023-01-17 DIAGNOSIS — I69818 Other symptoms and signs involving cognitive functions following other cerebrovascular disease: Secondary | ICD-10-CM | POA: Diagnosis not present

## 2023-01-17 DIAGNOSIS — L603 Nail dystrophy: Secondary | ICD-10-CM | POA: Diagnosis not present

## 2023-01-17 DIAGNOSIS — I69891 Dysphagia following other cerebrovascular disease: Secondary | ICD-10-CM | POA: Diagnosis not present

## 2023-01-17 DIAGNOSIS — I5030 Unspecified diastolic (congestive) heart failure: Secondary | ICD-10-CM | POA: Diagnosis not present

## 2023-01-17 DIAGNOSIS — M2041 Other hammer toe(s) (acquired), right foot: Secondary | ICD-10-CM | POA: Diagnosis not present

## 2023-01-17 DIAGNOSIS — B351 Tinea unguium: Secondary | ICD-10-CM | POA: Diagnosis not present

## 2023-01-17 DIAGNOSIS — I4819 Other persistent atrial fibrillation: Secondary | ICD-10-CM | POA: Diagnosis not present

## 2023-01-17 DIAGNOSIS — M2042 Other hammer toe(s) (acquired), left foot: Secondary | ICD-10-CM | POA: Diagnosis not present

## 2023-01-17 DIAGNOSIS — J90 Pleural effusion, not elsewhere classified: Secondary | ICD-10-CM | POA: Diagnosis not present

## 2023-01-17 DIAGNOSIS — I739 Peripheral vascular disease, unspecified: Secondary | ICD-10-CM | POA: Diagnosis not present

## 2023-01-17 DIAGNOSIS — D649 Anemia, unspecified: Secondary | ICD-10-CM | POA: Diagnosis not present

## 2023-01-21 DIAGNOSIS — J90 Pleural effusion, not elsewhere classified: Secondary | ICD-10-CM | POA: Diagnosis not present

## 2023-01-21 DIAGNOSIS — D649 Anemia, unspecified: Secondary | ICD-10-CM | POA: Diagnosis not present

## 2023-01-21 DIAGNOSIS — I69891 Dysphagia following other cerebrovascular disease: Secondary | ICD-10-CM | POA: Diagnosis not present

## 2023-01-21 DIAGNOSIS — I4819 Other persistent atrial fibrillation: Secondary | ICD-10-CM | POA: Diagnosis not present

## 2023-01-21 DIAGNOSIS — I5030 Unspecified diastolic (congestive) heart failure: Secondary | ICD-10-CM | POA: Diagnosis not present

## 2023-01-21 DIAGNOSIS — I69818 Other symptoms and signs involving cognitive functions following other cerebrovascular disease: Secondary | ICD-10-CM | POA: Diagnosis not present

## 2023-01-22 DIAGNOSIS — I69818 Other symptoms and signs involving cognitive functions following other cerebrovascular disease: Secondary | ICD-10-CM | POA: Diagnosis not present

## 2023-01-22 DIAGNOSIS — Z853 Personal history of malignant neoplasm of breast: Secondary | ICD-10-CM | POA: Diagnosis not present

## 2023-01-22 DIAGNOSIS — I5030 Unspecified diastolic (congestive) heart failure: Secondary | ICD-10-CM | POA: Diagnosis not present

## 2023-01-22 DIAGNOSIS — N809 Endometriosis, unspecified: Secondary | ICD-10-CM | POA: Diagnosis not present

## 2023-01-22 DIAGNOSIS — F32A Depression, unspecified: Secondary | ICD-10-CM | POA: Diagnosis not present

## 2023-01-22 DIAGNOSIS — Z85118 Personal history of other malignant neoplasm of bronchus and lung: Secondary | ICD-10-CM | POA: Diagnosis not present

## 2023-01-22 DIAGNOSIS — K760 Fatty (change of) liver, not elsewhere classified: Secondary | ICD-10-CM | POA: Diagnosis not present

## 2023-01-22 DIAGNOSIS — Z8572 Personal history of non-Hodgkin lymphomas: Secondary | ICD-10-CM | POA: Diagnosis not present

## 2023-01-22 DIAGNOSIS — Z741 Need for assistance with personal care: Secondary | ICD-10-CM | POA: Diagnosis not present

## 2023-01-22 DIAGNOSIS — R32 Unspecified urinary incontinence: Secondary | ICD-10-CM | POA: Diagnosis not present

## 2023-01-22 DIAGNOSIS — Z90710 Acquired absence of both cervix and uterus: Secondary | ICD-10-CM | POA: Diagnosis not present

## 2023-01-22 DIAGNOSIS — D649 Anemia, unspecified: Secondary | ICD-10-CM | POA: Diagnosis not present

## 2023-01-22 DIAGNOSIS — J690 Pneumonitis due to inhalation of food and vomit: Secondary | ICD-10-CM | POA: Diagnosis not present

## 2023-01-22 DIAGNOSIS — K635 Polyp of colon: Secondary | ICD-10-CM | POA: Diagnosis not present

## 2023-01-22 DIAGNOSIS — M1990 Unspecified osteoarthritis, unspecified site: Secondary | ICD-10-CM | POA: Diagnosis not present

## 2023-01-22 DIAGNOSIS — I4819 Other persistent atrial fibrillation: Secondary | ICD-10-CM | POA: Diagnosis not present

## 2023-01-22 DIAGNOSIS — J90 Pleural effusion, not elsewhere classified: Secondary | ICD-10-CM | POA: Diagnosis not present

## 2023-01-22 DIAGNOSIS — Z9884 Bariatric surgery status: Secondary | ICD-10-CM | POA: Diagnosis not present

## 2023-01-22 DIAGNOSIS — R159 Full incontinence of feces: Secondary | ICD-10-CM | POA: Diagnosis not present

## 2023-01-22 DIAGNOSIS — Z6823 Body mass index (BMI) 23.0-23.9, adult: Secondary | ICD-10-CM | POA: Diagnosis not present

## 2023-01-22 DIAGNOSIS — I69891 Dysphagia following other cerebrovascular disease: Secondary | ICD-10-CM | POA: Diagnosis not present

## 2023-01-22 DIAGNOSIS — Z8673 Personal history of transient ischemic attack (TIA), and cerebral infarction without residual deficits: Secondary | ICD-10-CM | POA: Diagnosis not present

## 2023-01-22 DIAGNOSIS — N200 Calculus of kidney: Secondary | ICD-10-CM | POA: Diagnosis not present

## 2023-01-22 DIAGNOSIS — Z8639 Personal history of other endocrine, nutritional and metabolic disease: Secondary | ICD-10-CM | POA: Diagnosis not present

## 2023-01-22 DIAGNOSIS — R296 Repeated falls: Secondary | ICD-10-CM | POA: Diagnosis not present

## 2023-01-23 DIAGNOSIS — I4819 Other persistent atrial fibrillation: Secondary | ICD-10-CM | POA: Diagnosis not present

## 2023-01-23 DIAGNOSIS — I69891 Dysphagia following other cerebrovascular disease: Secondary | ICD-10-CM | POA: Diagnosis not present

## 2023-01-23 DIAGNOSIS — J90 Pleural effusion, not elsewhere classified: Secondary | ICD-10-CM | POA: Diagnosis not present

## 2023-01-23 DIAGNOSIS — D649 Anemia, unspecified: Secondary | ICD-10-CM | POA: Diagnosis not present

## 2023-01-23 DIAGNOSIS — I5030 Unspecified diastolic (congestive) heart failure: Secondary | ICD-10-CM | POA: Diagnosis not present

## 2023-01-23 DIAGNOSIS — I69818 Other symptoms and signs involving cognitive functions following other cerebrovascular disease: Secondary | ICD-10-CM | POA: Diagnosis not present

## 2023-01-28 DIAGNOSIS — D649 Anemia, unspecified: Secondary | ICD-10-CM | POA: Diagnosis not present

## 2023-01-28 DIAGNOSIS — I69891 Dysphagia following other cerebrovascular disease: Secondary | ICD-10-CM | POA: Diagnosis not present

## 2023-01-28 DIAGNOSIS — F32A Depression, unspecified: Secondary | ICD-10-CM | POA: Diagnosis not present

## 2023-01-28 DIAGNOSIS — E785 Hyperlipidemia, unspecified: Secondary | ICD-10-CM | POA: Diagnosis not present

## 2023-01-28 DIAGNOSIS — I4891 Unspecified atrial fibrillation: Secondary | ICD-10-CM | POA: Diagnosis not present

## 2023-01-28 DIAGNOSIS — I4819 Other persistent atrial fibrillation: Secondary | ICD-10-CM | POA: Diagnosis not present

## 2023-01-28 DIAGNOSIS — I69818 Other symptoms and signs involving cognitive functions following other cerebrovascular disease: Secondary | ICD-10-CM | POA: Diagnosis not present

## 2023-01-28 DIAGNOSIS — I5030 Unspecified diastolic (congestive) heart failure: Secondary | ICD-10-CM | POA: Diagnosis not present

## 2023-01-28 DIAGNOSIS — E039 Hypothyroidism, unspecified: Secondary | ICD-10-CM | POA: Diagnosis not present

## 2023-01-28 DIAGNOSIS — F02A3 Dementia in other diseases classified elsewhere, mild, with mood disturbance: Secondary | ICD-10-CM | POA: Diagnosis not present

## 2023-01-28 DIAGNOSIS — F02A4 Dementia in other diseases classified elsewhere, mild, with anxiety: Secondary | ICD-10-CM | POA: Diagnosis not present

## 2023-01-28 DIAGNOSIS — J90 Pleural effusion, not elsewhere classified: Secondary | ICD-10-CM | POA: Diagnosis not present

## 2023-01-30 DIAGNOSIS — D649 Anemia, unspecified: Secondary | ICD-10-CM | POA: Diagnosis not present

## 2023-01-30 DIAGNOSIS — I69891 Dysphagia following other cerebrovascular disease: Secondary | ICD-10-CM | POA: Diagnosis not present

## 2023-01-30 DIAGNOSIS — I5030 Unspecified diastolic (congestive) heart failure: Secondary | ICD-10-CM | POA: Diagnosis not present

## 2023-01-30 DIAGNOSIS — I69818 Other symptoms and signs involving cognitive functions following other cerebrovascular disease: Secondary | ICD-10-CM | POA: Diagnosis not present

## 2023-01-30 DIAGNOSIS — J90 Pleural effusion, not elsewhere classified: Secondary | ICD-10-CM | POA: Diagnosis not present

## 2023-01-30 DIAGNOSIS — I4819 Other persistent atrial fibrillation: Secondary | ICD-10-CM | POA: Diagnosis not present

## 2023-02-04 DIAGNOSIS — I5030 Unspecified diastolic (congestive) heart failure: Secondary | ICD-10-CM | POA: Diagnosis not present

## 2023-02-04 DIAGNOSIS — J90 Pleural effusion, not elsewhere classified: Secondary | ICD-10-CM | POA: Diagnosis not present

## 2023-02-04 DIAGNOSIS — I69818 Other symptoms and signs involving cognitive functions following other cerebrovascular disease: Secondary | ICD-10-CM | POA: Diagnosis not present

## 2023-02-04 DIAGNOSIS — D649 Anemia, unspecified: Secondary | ICD-10-CM | POA: Diagnosis not present

## 2023-02-04 DIAGNOSIS — I69891 Dysphagia following other cerebrovascular disease: Secondary | ICD-10-CM | POA: Diagnosis not present

## 2023-02-04 DIAGNOSIS — I4819 Other persistent atrial fibrillation: Secondary | ICD-10-CM | POA: Diagnosis not present

## 2023-02-06 DIAGNOSIS — J90 Pleural effusion, not elsewhere classified: Secondary | ICD-10-CM | POA: Diagnosis not present

## 2023-02-06 DIAGNOSIS — I5030 Unspecified diastolic (congestive) heart failure: Secondary | ICD-10-CM | POA: Diagnosis not present

## 2023-02-06 DIAGNOSIS — D649 Anemia, unspecified: Secondary | ICD-10-CM | POA: Diagnosis not present

## 2023-02-06 DIAGNOSIS — I69891 Dysphagia following other cerebrovascular disease: Secondary | ICD-10-CM | POA: Diagnosis not present

## 2023-02-06 DIAGNOSIS — I4819 Other persistent atrial fibrillation: Secondary | ICD-10-CM | POA: Diagnosis not present

## 2023-02-06 DIAGNOSIS — I69818 Other symptoms and signs involving cognitive functions following other cerebrovascular disease: Secondary | ICD-10-CM | POA: Diagnosis not present

## 2023-02-11 DIAGNOSIS — I69818 Other symptoms and signs involving cognitive functions following other cerebrovascular disease: Secondary | ICD-10-CM | POA: Diagnosis not present

## 2023-02-11 DIAGNOSIS — J90 Pleural effusion, not elsewhere classified: Secondary | ICD-10-CM | POA: Diagnosis not present

## 2023-02-11 DIAGNOSIS — I4819 Other persistent atrial fibrillation: Secondary | ICD-10-CM | POA: Diagnosis not present

## 2023-02-11 DIAGNOSIS — I69891 Dysphagia following other cerebrovascular disease: Secondary | ICD-10-CM | POA: Diagnosis not present

## 2023-02-11 DIAGNOSIS — D649 Anemia, unspecified: Secondary | ICD-10-CM | POA: Diagnosis not present

## 2023-02-11 DIAGNOSIS — I5030 Unspecified diastolic (congestive) heart failure: Secondary | ICD-10-CM | POA: Diagnosis not present

## 2023-02-13 DIAGNOSIS — I69818 Other symptoms and signs involving cognitive functions following other cerebrovascular disease: Secondary | ICD-10-CM | POA: Diagnosis not present

## 2023-02-13 DIAGNOSIS — D649 Anemia, unspecified: Secondary | ICD-10-CM | POA: Diagnosis not present

## 2023-02-13 DIAGNOSIS — I4819 Other persistent atrial fibrillation: Secondary | ICD-10-CM | POA: Diagnosis not present

## 2023-02-13 DIAGNOSIS — I5030 Unspecified diastolic (congestive) heart failure: Secondary | ICD-10-CM | POA: Diagnosis not present

## 2023-02-13 DIAGNOSIS — J90 Pleural effusion, not elsewhere classified: Secondary | ICD-10-CM | POA: Diagnosis not present

## 2023-02-13 DIAGNOSIS — I69891 Dysphagia following other cerebrovascular disease: Secondary | ICD-10-CM | POA: Diagnosis not present

## 2023-02-18 DIAGNOSIS — I4819 Other persistent atrial fibrillation: Secondary | ICD-10-CM | POA: Diagnosis not present

## 2023-02-18 DIAGNOSIS — J90 Pleural effusion, not elsewhere classified: Secondary | ICD-10-CM | POA: Diagnosis not present

## 2023-02-18 DIAGNOSIS — I69818 Other symptoms and signs involving cognitive functions following other cerebrovascular disease: Secondary | ICD-10-CM | POA: Diagnosis not present

## 2023-02-18 DIAGNOSIS — I5030 Unspecified diastolic (congestive) heart failure: Secondary | ICD-10-CM | POA: Diagnosis not present

## 2023-02-18 DIAGNOSIS — I69891 Dysphagia following other cerebrovascular disease: Secondary | ICD-10-CM | POA: Diagnosis not present

## 2023-02-18 DIAGNOSIS — D649 Anemia, unspecified: Secondary | ICD-10-CM | POA: Diagnosis not present

## 2023-02-19 DIAGNOSIS — I69891 Dysphagia following other cerebrovascular disease: Secondary | ICD-10-CM | POA: Diagnosis not present

## 2023-02-19 DIAGNOSIS — J90 Pleural effusion, not elsewhere classified: Secondary | ICD-10-CM | POA: Diagnosis not present

## 2023-02-19 DIAGNOSIS — I69818 Other symptoms and signs involving cognitive functions following other cerebrovascular disease: Secondary | ICD-10-CM | POA: Diagnosis not present

## 2023-02-19 DIAGNOSIS — I5030 Unspecified diastolic (congestive) heart failure: Secondary | ICD-10-CM | POA: Diagnosis not present

## 2023-02-19 DIAGNOSIS — I4819 Other persistent atrial fibrillation: Secondary | ICD-10-CM | POA: Diagnosis not present

## 2023-02-19 DIAGNOSIS — D649 Anemia, unspecified: Secondary | ICD-10-CM | POA: Diagnosis not present

## 2023-02-20 DIAGNOSIS — J90 Pleural effusion, not elsewhere classified: Secondary | ICD-10-CM | POA: Diagnosis not present

## 2023-02-20 DIAGNOSIS — I69891 Dysphagia following other cerebrovascular disease: Secondary | ICD-10-CM | POA: Diagnosis not present

## 2023-02-20 DIAGNOSIS — I69818 Other symptoms and signs involving cognitive functions following other cerebrovascular disease: Secondary | ICD-10-CM | POA: Diagnosis not present

## 2023-02-20 DIAGNOSIS — I4819 Other persistent atrial fibrillation: Secondary | ICD-10-CM | POA: Diagnosis not present

## 2023-02-20 DIAGNOSIS — D649 Anemia, unspecified: Secondary | ICD-10-CM | POA: Diagnosis not present

## 2023-02-20 DIAGNOSIS — I5030 Unspecified diastolic (congestive) heart failure: Secondary | ICD-10-CM | POA: Diagnosis not present

## 2023-02-22 DIAGNOSIS — Z90710 Acquired absence of both cervix and uterus: Secondary | ICD-10-CM | POA: Diagnosis not present

## 2023-02-22 DIAGNOSIS — Z8673 Personal history of transient ischemic attack (TIA), and cerebral infarction without residual deficits: Secondary | ICD-10-CM | POA: Diagnosis not present

## 2023-02-22 DIAGNOSIS — I5030 Unspecified diastolic (congestive) heart failure: Secondary | ICD-10-CM | POA: Diagnosis not present

## 2023-02-22 DIAGNOSIS — N809 Endometriosis, unspecified: Secondary | ICD-10-CM | POA: Diagnosis not present

## 2023-02-22 DIAGNOSIS — I69891 Dysphagia following other cerebrovascular disease: Secondary | ICD-10-CM | POA: Diagnosis not present

## 2023-02-22 DIAGNOSIS — Z853 Personal history of malignant neoplasm of breast: Secondary | ICD-10-CM | POA: Diagnosis not present

## 2023-02-22 DIAGNOSIS — N200 Calculus of kidney: Secondary | ICD-10-CM | POA: Diagnosis not present

## 2023-02-22 DIAGNOSIS — Z741 Need for assistance with personal care: Secondary | ICD-10-CM | POA: Diagnosis not present

## 2023-02-22 DIAGNOSIS — Z6823 Body mass index (BMI) 23.0-23.9, adult: Secondary | ICD-10-CM | POA: Diagnosis not present

## 2023-02-22 DIAGNOSIS — J90 Pleural effusion, not elsewhere classified: Secondary | ICD-10-CM | POA: Diagnosis not present

## 2023-02-22 DIAGNOSIS — Z85118 Personal history of other malignant neoplasm of bronchus and lung: Secondary | ICD-10-CM | POA: Diagnosis not present

## 2023-02-22 DIAGNOSIS — I69818 Other symptoms and signs involving cognitive functions following other cerebrovascular disease: Secondary | ICD-10-CM | POA: Diagnosis not present

## 2023-02-22 DIAGNOSIS — Z8572 Personal history of non-Hodgkin lymphomas: Secondary | ICD-10-CM | POA: Diagnosis not present

## 2023-02-22 DIAGNOSIS — R32 Unspecified urinary incontinence: Secondary | ICD-10-CM | POA: Diagnosis not present

## 2023-02-22 DIAGNOSIS — K635 Polyp of colon: Secondary | ICD-10-CM | POA: Diagnosis not present

## 2023-02-22 DIAGNOSIS — R296 Repeated falls: Secondary | ICD-10-CM | POA: Diagnosis not present

## 2023-02-22 DIAGNOSIS — I4819 Other persistent atrial fibrillation: Secondary | ICD-10-CM | POA: Diagnosis not present

## 2023-02-22 DIAGNOSIS — F32A Depression, unspecified: Secondary | ICD-10-CM | POA: Diagnosis not present

## 2023-02-22 DIAGNOSIS — Z8639 Personal history of other endocrine, nutritional and metabolic disease: Secondary | ICD-10-CM | POA: Diagnosis not present

## 2023-02-22 DIAGNOSIS — M1990 Unspecified osteoarthritis, unspecified site: Secondary | ICD-10-CM | POA: Diagnosis not present

## 2023-02-22 DIAGNOSIS — D649 Anemia, unspecified: Secondary | ICD-10-CM | POA: Diagnosis not present

## 2023-02-22 DIAGNOSIS — K760 Fatty (change of) liver, not elsewhere classified: Secondary | ICD-10-CM | POA: Diagnosis not present

## 2023-02-22 DIAGNOSIS — R159 Full incontinence of feces: Secondary | ICD-10-CM | POA: Diagnosis not present

## 2023-02-22 DIAGNOSIS — J690 Pneumonitis due to inhalation of food and vomit: Secondary | ICD-10-CM | POA: Diagnosis not present

## 2023-02-22 DIAGNOSIS — Z9884 Bariatric surgery status: Secondary | ICD-10-CM | POA: Diagnosis not present

## 2023-02-24 DIAGNOSIS — I69891 Dysphagia following other cerebrovascular disease: Secondary | ICD-10-CM | POA: Diagnosis not present

## 2023-02-24 DIAGNOSIS — J90 Pleural effusion, not elsewhere classified: Secondary | ICD-10-CM | POA: Diagnosis not present

## 2023-02-24 DIAGNOSIS — I69818 Other symptoms and signs involving cognitive functions following other cerebrovascular disease: Secondary | ICD-10-CM | POA: Diagnosis not present

## 2023-02-24 DIAGNOSIS — D649 Anemia, unspecified: Secondary | ICD-10-CM | POA: Diagnosis not present

## 2023-02-24 DIAGNOSIS — I4819 Other persistent atrial fibrillation: Secondary | ICD-10-CM | POA: Diagnosis not present

## 2023-02-24 DIAGNOSIS — I5030 Unspecified diastolic (congestive) heart failure: Secondary | ICD-10-CM | POA: Diagnosis not present

## 2023-02-25 DIAGNOSIS — J90 Pleural effusion, not elsewhere classified: Secondary | ICD-10-CM | POA: Diagnosis not present

## 2023-02-25 DIAGNOSIS — I4819 Other persistent atrial fibrillation: Secondary | ICD-10-CM | POA: Diagnosis not present

## 2023-02-25 DIAGNOSIS — I69818 Other symptoms and signs involving cognitive functions following other cerebrovascular disease: Secondary | ICD-10-CM | POA: Diagnosis not present

## 2023-02-25 DIAGNOSIS — D649 Anemia, unspecified: Secondary | ICD-10-CM | POA: Diagnosis not present

## 2023-02-25 DIAGNOSIS — I5030 Unspecified diastolic (congestive) heart failure: Secondary | ICD-10-CM | POA: Diagnosis not present

## 2023-02-25 DIAGNOSIS — I69891 Dysphagia following other cerebrovascular disease: Secondary | ICD-10-CM | POA: Diagnosis not present

## 2023-02-26 DIAGNOSIS — D649 Anemia, unspecified: Secondary | ICD-10-CM | POA: Diagnosis not present

## 2023-02-26 DIAGNOSIS — I69891 Dysphagia following other cerebrovascular disease: Secondary | ICD-10-CM | POA: Diagnosis not present

## 2023-02-26 DIAGNOSIS — I5030 Unspecified diastolic (congestive) heart failure: Secondary | ICD-10-CM | POA: Diagnosis not present

## 2023-02-26 DIAGNOSIS — I4819 Other persistent atrial fibrillation: Secondary | ICD-10-CM | POA: Diagnosis not present

## 2023-02-26 DIAGNOSIS — I69818 Other symptoms and signs involving cognitive functions following other cerebrovascular disease: Secondary | ICD-10-CM | POA: Diagnosis not present

## 2023-02-26 DIAGNOSIS — J90 Pleural effusion, not elsewhere classified: Secondary | ICD-10-CM | POA: Diagnosis not present

## 2023-02-27 DIAGNOSIS — I69891 Dysphagia following other cerebrovascular disease: Secondary | ICD-10-CM | POA: Diagnosis not present

## 2023-02-27 DIAGNOSIS — I5030 Unspecified diastolic (congestive) heart failure: Secondary | ICD-10-CM | POA: Diagnosis not present

## 2023-02-27 DIAGNOSIS — I69818 Other symptoms and signs involving cognitive functions following other cerebrovascular disease: Secondary | ICD-10-CM | POA: Diagnosis not present

## 2023-02-27 DIAGNOSIS — I4819 Other persistent atrial fibrillation: Secondary | ICD-10-CM | POA: Diagnosis not present

## 2023-02-27 DIAGNOSIS — D649 Anemia, unspecified: Secondary | ICD-10-CM | POA: Diagnosis not present

## 2023-02-27 DIAGNOSIS — J90 Pleural effusion, not elsewhere classified: Secondary | ICD-10-CM | POA: Diagnosis not present

## 2023-03-04 DIAGNOSIS — I4819 Other persistent atrial fibrillation: Secondary | ICD-10-CM | POA: Diagnosis not present

## 2023-03-04 DIAGNOSIS — I69891 Dysphagia following other cerebrovascular disease: Secondary | ICD-10-CM | POA: Diagnosis not present

## 2023-03-04 DIAGNOSIS — J90 Pleural effusion, not elsewhere classified: Secondary | ICD-10-CM | POA: Diagnosis not present

## 2023-03-04 DIAGNOSIS — I5030 Unspecified diastolic (congestive) heart failure: Secondary | ICD-10-CM | POA: Diagnosis not present

## 2023-03-04 DIAGNOSIS — I69818 Other symptoms and signs involving cognitive functions following other cerebrovascular disease: Secondary | ICD-10-CM | POA: Diagnosis not present

## 2023-03-04 DIAGNOSIS — D649 Anemia, unspecified: Secondary | ICD-10-CM | POA: Diagnosis not present

## 2023-03-06 DIAGNOSIS — I69891 Dysphagia following other cerebrovascular disease: Secondary | ICD-10-CM | POA: Diagnosis not present

## 2023-03-06 DIAGNOSIS — J90 Pleural effusion, not elsewhere classified: Secondary | ICD-10-CM | POA: Diagnosis not present

## 2023-03-06 DIAGNOSIS — I4819 Other persistent atrial fibrillation: Secondary | ICD-10-CM | POA: Diagnosis not present

## 2023-03-06 DIAGNOSIS — I5030 Unspecified diastolic (congestive) heart failure: Secondary | ICD-10-CM | POA: Diagnosis not present

## 2023-03-06 DIAGNOSIS — D649 Anemia, unspecified: Secondary | ICD-10-CM | POA: Diagnosis not present

## 2023-03-06 DIAGNOSIS — I69818 Other symptoms and signs involving cognitive functions following other cerebrovascular disease: Secondary | ICD-10-CM | POA: Diagnosis not present

## 2023-03-11 DIAGNOSIS — D649 Anemia, unspecified: Secondary | ICD-10-CM | POA: Diagnosis not present

## 2023-03-11 DIAGNOSIS — I69891 Dysphagia following other cerebrovascular disease: Secondary | ICD-10-CM | POA: Diagnosis not present

## 2023-03-11 DIAGNOSIS — I4819 Other persistent atrial fibrillation: Secondary | ICD-10-CM | POA: Diagnosis not present

## 2023-03-11 DIAGNOSIS — I5030 Unspecified diastolic (congestive) heart failure: Secondary | ICD-10-CM | POA: Diagnosis not present

## 2023-03-11 DIAGNOSIS — I69818 Other symptoms and signs involving cognitive functions following other cerebrovascular disease: Secondary | ICD-10-CM | POA: Diagnosis not present

## 2023-03-11 DIAGNOSIS — J90 Pleural effusion, not elsewhere classified: Secondary | ICD-10-CM | POA: Diagnosis not present

## 2023-03-13 DIAGNOSIS — D649 Anemia, unspecified: Secondary | ICD-10-CM | POA: Diagnosis not present

## 2023-03-13 DIAGNOSIS — I69891 Dysphagia following other cerebrovascular disease: Secondary | ICD-10-CM | POA: Diagnosis not present

## 2023-03-13 DIAGNOSIS — I4819 Other persistent atrial fibrillation: Secondary | ICD-10-CM | POA: Diagnosis not present

## 2023-03-13 DIAGNOSIS — J90 Pleural effusion, not elsewhere classified: Secondary | ICD-10-CM | POA: Diagnosis not present

## 2023-03-13 DIAGNOSIS — I5030 Unspecified diastolic (congestive) heart failure: Secondary | ICD-10-CM | POA: Diagnosis not present

## 2023-03-13 DIAGNOSIS — I69818 Other symptoms and signs involving cognitive functions following other cerebrovascular disease: Secondary | ICD-10-CM | POA: Diagnosis not present

## 2023-03-18 DIAGNOSIS — D649 Anemia, unspecified: Secondary | ICD-10-CM | POA: Diagnosis not present

## 2023-03-18 DIAGNOSIS — I5030 Unspecified diastolic (congestive) heart failure: Secondary | ICD-10-CM | POA: Diagnosis not present

## 2023-03-18 DIAGNOSIS — I69818 Other symptoms and signs involving cognitive functions following other cerebrovascular disease: Secondary | ICD-10-CM | POA: Diagnosis not present

## 2023-03-18 DIAGNOSIS — J90 Pleural effusion, not elsewhere classified: Secondary | ICD-10-CM | POA: Diagnosis not present

## 2023-03-18 DIAGNOSIS — I4819 Other persistent atrial fibrillation: Secondary | ICD-10-CM | POA: Diagnosis not present

## 2023-03-18 DIAGNOSIS — I69891 Dysphagia following other cerebrovascular disease: Secondary | ICD-10-CM | POA: Diagnosis not present

## 2023-03-20 DIAGNOSIS — D649 Anemia, unspecified: Secondary | ICD-10-CM | POA: Diagnosis not present

## 2023-03-20 DIAGNOSIS — I5030 Unspecified diastolic (congestive) heart failure: Secondary | ICD-10-CM | POA: Diagnosis not present

## 2023-03-20 DIAGNOSIS — I69891 Dysphagia following other cerebrovascular disease: Secondary | ICD-10-CM | POA: Diagnosis not present

## 2023-03-20 DIAGNOSIS — J90 Pleural effusion, not elsewhere classified: Secondary | ICD-10-CM | POA: Diagnosis not present

## 2023-03-20 DIAGNOSIS — I69818 Other symptoms and signs involving cognitive functions following other cerebrovascular disease: Secondary | ICD-10-CM | POA: Diagnosis not present

## 2023-03-20 DIAGNOSIS — I4819 Other persistent atrial fibrillation: Secondary | ICD-10-CM | POA: Diagnosis not present

## 2023-03-24 DIAGNOSIS — Z8572 Personal history of non-Hodgkin lymphomas: Secondary | ICD-10-CM | POA: Diagnosis not present

## 2023-03-24 DIAGNOSIS — R32 Unspecified urinary incontinence: Secondary | ICD-10-CM | POA: Diagnosis not present

## 2023-03-24 DIAGNOSIS — Z8639 Personal history of other endocrine, nutritional and metabolic disease: Secondary | ICD-10-CM | POA: Diagnosis not present

## 2023-03-24 DIAGNOSIS — I69818 Other symptoms and signs involving cognitive functions following other cerebrovascular disease: Secondary | ICD-10-CM | POA: Diagnosis not present

## 2023-03-24 DIAGNOSIS — Z8673 Personal history of transient ischemic attack (TIA), and cerebral infarction without residual deficits: Secondary | ICD-10-CM | POA: Diagnosis not present

## 2023-03-24 DIAGNOSIS — K760 Fatty (change of) liver, not elsewhere classified: Secondary | ICD-10-CM | POA: Diagnosis not present

## 2023-03-24 DIAGNOSIS — Z741 Need for assistance with personal care: Secondary | ICD-10-CM | POA: Diagnosis not present

## 2023-03-24 DIAGNOSIS — K635 Polyp of colon: Secondary | ICD-10-CM | POA: Diagnosis not present

## 2023-03-24 DIAGNOSIS — D649 Anemia, unspecified: Secondary | ICD-10-CM | POA: Diagnosis not present

## 2023-03-24 DIAGNOSIS — R296 Repeated falls: Secondary | ICD-10-CM | POA: Diagnosis not present

## 2023-03-24 DIAGNOSIS — I4819 Other persistent atrial fibrillation: Secondary | ICD-10-CM | POA: Diagnosis not present

## 2023-03-24 DIAGNOSIS — F32A Depression, unspecified: Secondary | ICD-10-CM | POA: Diagnosis not present

## 2023-03-24 DIAGNOSIS — N200 Calculus of kidney: Secondary | ICD-10-CM | POA: Diagnosis not present

## 2023-03-24 DIAGNOSIS — I69891 Dysphagia following other cerebrovascular disease: Secondary | ICD-10-CM | POA: Diagnosis not present

## 2023-03-24 DIAGNOSIS — J90 Pleural effusion, not elsewhere classified: Secondary | ICD-10-CM | POA: Diagnosis not present

## 2023-03-24 DIAGNOSIS — Z853 Personal history of malignant neoplasm of breast: Secondary | ICD-10-CM | POA: Diagnosis not present

## 2023-03-24 DIAGNOSIS — N809 Endometriosis, unspecified: Secondary | ICD-10-CM | POA: Diagnosis not present

## 2023-03-24 DIAGNOSIS — Z85118 Personal history of other malignant neoplasm of bronchus and lung: Secondary | ICD-10-CM | POA: Diagnosis not present

## 2023-03-24 DIAGNOSIS — I5030 Unspecified diastolic (congestive) heart failure: Secondary | ICD-10-CM | POA: Diagnosis not present

## 2023-03-24 DIAGNOSIS — Z9884 Bariatric surgery status: Secondary | ICD-10-CM | POA: Diagnosis not present

## 2023-03-24 DIAGNOSIS — M1990 Unspecified osteoarthritis, unspecified site: Secondary | ICD-10-CM | POA: Diagnosis not present

## 2023-03-24 DIAGNOSIS — R159 Full incontinence of feces: Secondary | ICD-10-CM | POA: Diagnosis not present

## 2023-03-24 DIAGNOSIS — Z6823 Body mass index (BMI) 23.0-23.9, adult: Secondary | ICD-10-CM | POA: Diagnosis not present

## 2023-03-24 DIAGNOSIS — J690 Pneumonitis due to inhalation of food and vomit: Secondary | ICD-10-CM | POA: Diagnosis not present

## 2023-03-24 DIAGNOSIS — Z90710 Acquired absence of both cervix and uterus: Secondary | ICD-10-CM | POA: Diagnosis not present

## 2023-03-25 DIAGNOSIS — D649 Anemia, unspecified: Secondary | ICD-10-CM | POA: Diagnosis not present

## 2023-03-25 DIAGNOSIS — I69891 Dysphagia following other cerebrovascular disease: Secondary | ICD-10-CM | POA: Diagnosis not present

## 2023-03-25 DIAGNOSIS — I69818 Other symptoms and signs involving cognitive functions following other cerebrovascular disease: Secondary | ICD-10-CM | POA: Diagnosis not present

## 2023-03-25 DIAGNOSIS — I4819 Other persistent atrial fibrillation: Secondary | ICD-10-CM | POA: Diagnosis not present

## 2023-03-25 DIAGNOSIS — J90 Pleural effusion, not elsewhere classified: Secondary | ICD-10-CM | POA: Diagnosis not present

## 2023-03-25 DIAGNOSIS — I5030 Unspecified diastolic (congestive) heart failure: Secondary | ICD-10-CM | POA: Diagnosis not present

## 2023-03-26 DIAGNOSIS — I4819 Other persistent atrial fibrillation: Secondary | ICD-10-CM | POA: Diagnosis not present

## 2023-03-26 DIAGNOSIS — I69891 Dysphagia following other cerebrovascular disease: Secondary | ICD-10-CM | POA: Diagnosis not present

## 2023-03-26 DIAGNOSIS — D649 Anemia, unspecified: Secondary | ICD-10-CM | POA: Diagnosis not present

## 2023-03-26 DIAGNOSIS — I69818 Other symptoms and signs involving cognitive functions following other cerebrovascular disease: Secondary | ICD-10-CM | POA: Diagnosis not present

## 2023-03-26 DIAGNOSIS — I5030 Unspecified diastolic (congestive) heart failure: Secondary | ICD-10-CM | POA: Diagnosis not present

## 2023-03-26 DIAGNOSIS — J90 Pleural effusion, not elsewhere classified: Secondary | ICD-10-CM | POA: Diagnosis not present

## 2023-03-27 DIAGNOSIS — I69818 Other symptoms and signs involving cognitive functions following other cerebrovascular disease: Secondary | ICD-10-CM | POA: Diagnosis not present

## 2023-03-27 DIAGNOSIS — I5030 Unspecified diastolic (congestive) heart failure: Secondary | ICD-10-CM | POA: Diagnosis not present

## 2023-03-27 DIAGNOSIS — I69891 Dysphagia following other cerebrovascular disease: Secondary | ICD-10-CM | POA: Diagnosis not present

## 2023-03-27 DIAGNOSIS — I4819 Other persistent atrial fibrillation: Secondary | ICD-10-CM | POA: Diagnosis not present

## 2023-03-27 DIAGNOSIS — D649 Anemia, unspecified: Secondary | ICD-10-CM | POA: Diagnosis not present

## 2023-03-27 DIAGNOSIS — J90 Pleural effusion, not elsewhere classified: Secondary | ICD-10-CM | POA: Diagnosis not present

## 2023-03-31 DIAGNOSIS — I4819 Other persistent atrial fibrillation: Secondary | ICD-10-CM | POA: Diagnosis not present

## 2023-03-31 DIAGNOSIS — I69818 Other symptoms and signs involving cognitive functions following other cerebrovascular disease: Secondary | ICD-10-CM | POA: Diagnosis not present

## 2023-03-31 DIAGNOSIS — I69891 Dysphagia following other cerebrovascular disease: Secondary | ICD-10-CM | POA: Diagnosis not present

## 2023-03-31 DIAGNOSIS — J90 Pleural effusion, not elsewhere classified: Secondary | ICD-10-CM | POA: Diagnosis not present

## 2023-03-31 DIAGNOSIS — D649 Anemia, unspecified: Secondary | ICD-10-CM | POA: Diagnosis not present

## 2023-03-31 DIAGNOSIS — I5030 Unspecified diastolic (congestive) heart failure: Secondary | ICD-10-CM | POA: Diagnosis not present

## 2023-04-01 DIAGNOSIS — D649 Anemia, unspecified: Secondary | ICD-10-CM | POA: Diagnosis not present

## 2023-04-01 DIAGNOSIS — I69818 Other symptoms and signs involving cognitive functions following other cerebrovascular disease: Secondary | ICD-10-CM | POA: Diagnosis not present

## 2023-04-01 DIAGNOSIS — I5030 Unspecified diastolic (congestive) heart failure: Secondary | ICD-10-CM | POA: Diagnosis not present

## 2023-04-01 DIAGNOSIS — I4819 Other persistent atrial fibrillation: Secondary | ICD-10-CM | POA: Diagnosis not present

## 2023-04-01 DIAGNOSIS — I69891 Dysphagia following other cerebrovascular disease: Secondary | ICD-10-CM | POA: Diagnosis not present

## 2023-04-01 DIAGNOSIS — J90 Pleural effusion, not elsewhere classified: Secondary | ICD-10-CM | POA: Diagnosis not present

## 2023-04-03 DIAGNOSIS — I69891 Dysphagia following other cerebrovascular disease: Secondary | ICD-10-CM | POA: Diagnosis not present

## 2023-04-03 DIAGNOSIS — I4819 Other persistent atrial fibrillation: Secondary | ICD-10-CM | POA: Diagnosis not present

## 2023-04-03 DIAGNOSIS — I69818 Other symptoms and signs involving cognitive functions following other cerebrovascular disease: Secondary | ICD-10-CM | POA: Diagnosis not present

## 2023-04-03 DIAGNOSIS — D649 Anemia, unspecified: Secondary | ICD-10-CM | POA: Diagnosis not present

## 2023-04-03 DIAGNOSIS — J90 Pleural effusion, not elsewhere classified: Secondary | ICD-10-CM | POA: Diagnosis not present

## 2023-04-03 DIAGNOSIS — I5030 Unspecified diastolic (congestive) heart failure: Secondary | ICD-10-CM | POA: Diagnosis not present

## 2023-04-07 DIAGNOSIS — I69818 Other symptoms and signs involving cognitive functions following other cerebrovascular disease: Secondary | ICD-10-CM | POA: Diagnosis not present

## 2023-04-07 DIAGNOSIS — I69891 Dysphagia following other cerebrovascular disease: Secondary | ICD-10-CM | POA: Diagnosis not present

## 2023-04-07 DIAGNOSIS — I4819 Other persistent atrial fibrillation: Secondary | ICD-10-CM | POA: Diagnosis not present

## 2023-04-07 DIAGNOSIS — D649 Anemia, unspecified: Secondary | ICD-10-CM | POA: Diagnosis not present

## 2023-04-07 DIAGNOSIS — I5030 Unspecified diastolic (congestive) heart failure: Secondary | ICD-10-CM | POA: Diagnosis not present

## 2023-04-07 DIAGNOSIS — J90 Pleural effusion, not elsewhere classified: Secondary | ICD-10-CM | POA: Diagnosis not present

## 2023-04-09 DIAGNOSIS — I5030 Unspecified diastolic (congestive) heart failure: Secondary | ICD-10-CM | POA: Diagnosis not present

## 2023-04-09 DIAGNOSIS — I69891 Dysphagia following other cerebrovascular disease: Secondary | ICD-10-CM | POA: Diagnosis not present

## 2023-04-09 DIAGNOSIS — I4819 Other persistent atrial fibrillation: Secondary | ICD-10-CM | POA: Diagnosis not present

## 2023-04-09 DIAGNOSIS — D649 Anemia, unspecified: Secondary | ICD-10-CM | POA: Diagnosis not present

## 2023-04-09 DIAGNOSIS — I69818 Other symptoms and signs involving cognitive functions following other cerebrovascular disease: Secondary | ICD-10-CM | POA: Diagnosis not present

## 2023-04-09 DIAGNOSIS — J90 Pleural effusion, not elsewhere classified: Secondary | ICD-10-CM | POA: Diagnosis not present

## 2023-04-11 DIAGNOSIS — I69891 Dysphagia following other cerebrovascular disease: Secondary | ICD-10-CM | POA: Diagnosis not present

## 2023-04-11 DIAGNOSIS — J90 Pleural effusion, not elsewhere classified: Secondary | ICD-10-CM | POA: Diagnosis not present

## 2023-04-11 DIAGNOSIS — I69818 Other symptoms and signs involving cognitive functions following other cerebrovascular disease: Secondary | ICD-10-CM | POA: Diagnosis not present

## 2023-04-11 DIAGNOSIS — I4819 Other persistent atrial fibrillation: Secondary | ICD-10-CM | POA: Diagnosis not present

## 2023-04-11 DIAGNOSIS — D649 Anemia, unspecified: Secondary | ICD-10-CM | POA: Diagnosis not present

## 2023-04-11 DIAGNOSIS — I5030 Unspecified diastolic (congestive) heart failure: Secondary | ICD-10-CM | POA: Diagnosis not present

## 2023-04-14 DIAGNOSIS — D649 Anemia, unspecified: Secondary | ICD-10-CM | POA: Diagnosis not present

## 2023-04-14 DIAGNOSIS — I5030 Unspecified diastolic (congestive) heart failure: Secondary | ICD-10-CM | POA: Diagnosis not present

## 2023-04-14 DIAGNOSIS — I69891 Dysphagia following other cerebrovascular disease: Secondary | ICD-10-CM | POA: Diagnosis not present

## 2023-04-14 DIAGNOSIS — I69818 Other symptoms and signs involving cognitive functions following other cerebrovascular disease: Secondary | ICD-10-CM | POA: Diagnosis not present

## 2023-04-14 DIAGNOSIS — J90 Pleural effusion, not elsewhere classified: Secondary | ICD-10-CM | POA: Diagnosis not present

## 2023-04-14 DIAGNOSIS — I4819 Other persistent atrial fibrillation: Secondary | ICD-10-CM | POA: Diagnosis not present

## 2023-04-15 DIAGNOSIS — D649 Anemia, unspecified: Secondary | ICD-10-CM | POA: Diagnosis not present

## 2023-04-15 DIAGNOSIS — I4819 Other persistent atrial fibrillation: Secondary | ICD-10-CM | POA: Diagnosis not present

## 2023-04-15 DIAGNOSIS — J90 Pleural effusion, not elsewhere classified: Secondary | ICD-10-CM | POA: Diagnosis not present

## 2023-04-15 DIAGNOSIS — I69818 Other symptoms and signs involving cognitive functions following other cerebrovascular disease: Secondary | ICD-10-CM | POA: Diagnosis not present

## 2023-04-15 DIAGNOSIS — I69891 Dysphagia following other cerebrovascular disease: Secondary | ICD-10-CM | POA: Diagnosis not present

## 2023-04-15 DIAGNOSIS — I5030 Unspecified diastolic (congestive) heart failure: Secondary | ICD-10-CM | POA: Diagnosis not present

## 2023-04-16 DIAGNOSIS — I69818 Other symptoms and signs involving cognitive functions following other cerebrovascular disease: Secondary | ICD-10-CM | POA: Diagnosis not present

## 2023-04-16 DIAGNOSIS — I4819 Other persistent atrial fibrillation: Secondary | ICD-10-CM | POA: Diagnosis not present

## 2023-04-16 DIAGNOSIS — J90 Pleural effusion, not elsewhere classified: Secondary | ICD-10-CM | POA: Diagnosis not present

## 2023-04-16 DIAGNOSIS — I69891 Dysphagia following other cerebrovascular disease: Secondary | ICD-10-CM | POA: Diagnosis not present

## 2023-04-16 DIAGNOSIS — I5030 Unspecified diastolic (congestive) heart failure: Secondary | ICD-10-CM | POA: Diagnosis not present

## 2023-04-16 DIAGNOSIS — D649 Anemia, unspecified: Secondary | ICD-10-CM | POA: Diagnosis not present

## 2023-04-19 DIAGNOSIS — I5030 Unspecified diastolic (congestive) heart failure: Secondary | ICD-10-CM | POA: Diagnosis not present

## 2023-04-19 DIAGNOSIS — D649 Anemia, unspecified: Secondary | ICD-10-CM | POA: Diagnosis not present

## 2023-04-19 DIAGNOSIS — I69818 Other symptoms and signs involving cognitive functions following other cerebrovascular disease: Secondary | ICD-10-CM | POA: Diagnosis not present

## 2023-04-19 DIAGNOSIS — I69891 Dysphagia following other cerebrovascular disease: Secondary | ICD-10-CM | POA: Diagnosis not present

## 2023-04-19 DIAGNOSIS — J90 Pleural effusion, not elsewhere classified: Secondary | ICD-10-CM | POA: Diagnosis not present

## 2023-04-19 DIAGNOSIS — I4819 Other persistent atrial fibrillation: Secondary | ICD-10-CM | POA: Diagnosis not present

## 2023-04-20 DIAGNOSIS — I4819 Other persistent atrial fibrillation: Secondary | ICD-10-CM | POA: Diagnosis not present

## 2023-04-20 DIAGNOSIS — I5030 Unspecified diastolic (congestive) heart failure: Secondary | ICD-10-CM | POA: Diagnosis not present

## 2023-04-20 DIAGNOSIS — I69818 Other symptoms and signs involving cognitive functions following other cerebrovascular disease: Secondary | ICD-10-CM | POA: Diagnosis not present

## 2023-04-20 DIAGNOSIS — I69891 Dysphagia following other cerebrovascular disease: Secondary | ICD-10-CM | POA: Diagnosis not present

## 2023-04-20 DIAGNOSIS — D649 Anemia, unspecified: Secondary | ICD-10-CM | POA: Diagnosis not present

## 2023-04-20 DIAGNOSIS — J90 Pleural effusion, not elsewhere classified: Secondary | ICD-10-CM | POA: Diagnosis not present

## 2023-04-21 DIAGNOSIS — D649 Anemia, unspecified: Secondary | ICD-10-CM | POA: Diagnosis not present

## 2023-04-21 DIAGNOSIS — I4819 Other persistent atrial fibrillation: Secondary | ICD-10-CM | POA: Diagnosis not present

## 2023-04-21 DIAGNOSIS — I69818 Other symptoms and signs involving cognitive functions following other cerebrovascular disease: Secondary | ICD-10-CM | POA: Diagnosis not present

## 2023-04-21 DIAGNOSIS — I5030 Unspecified diastolic (congestive) heart failure: Secondary | ICD-10-CM | POA: Diagnosis not present

## 2023-04-21 DIAGNOSIS — J90 Pleural effusion, not elsewhere classified: Secondary | ICD-10-CM | POA: Diagnosis not present

## 2023-04-21 DIAGNOSIS — I69891 Dysphagia following other cerebrovascular disease: Secondary | ICD-10-CM | POA: Diagnosis not present

## 2023-04-22 DIAGNOSIS — I4819 Other persistent atrial fibrillation: Secondary | ICD-10-CM | POA: Diagnosis not present

## 2023-04-22 DIAGNOSIS — J90 Pleural effusion, not elsewhere classified: Secondary | ICD-10-CM | POA: Diagnosis not present

## 2023-04-22 DIAGNOSIS — I69818 Other symptoms and signs involving cognitive functions following other cerebrovascular disease: Secondary | ICD-10-CM | POA: Diagnosis not present

## 2023-04-22 DIAGNOSIS — I5030 Unspecified diastolic (congestive) heart failure: Secondary | ICD-10-CM | POA: Diagnosis not present

## 2023-04-22 DIAGNOSIS — D649 Anemia, unspecified: Secondary | ICD-10-CM | POA: Diagnosis not present

## 2023-04-22 DIAGNOSIS — I69891 Dysphagia following other cerebrovascular disease: Secondary | ICD-10-CM | POA: Diagnosis not present

## 2023-04-24 DEATH — deceased

## 2023-06-15 IMAGING — CT CT ADDITIONAL VIEWS AT NO CHARGE
2 of 3 series · 17 of 46 positions shown, 19 images · IV contrast (agent unspecified)
Comparison: None.

CLINICAL DATA: Trauma, fall, pain

EXAM:
CT ADDITIONAL VIEWS AT NO CHARGE
TECHNIQUE: Axial, coronal and sagittal images of right hip were processed from
the CT renal stone study.
CONTRAST:  None

[Series 1: right hip st ax · axial · 0.60mm/px · z∈[+796,+1066]mm · 14 of 155 slices shown, 16 images]
[im 10/155  soft-tissue]
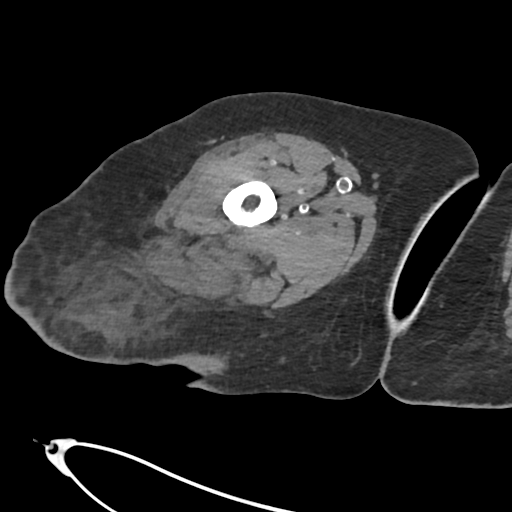
[im 10/155  bone]
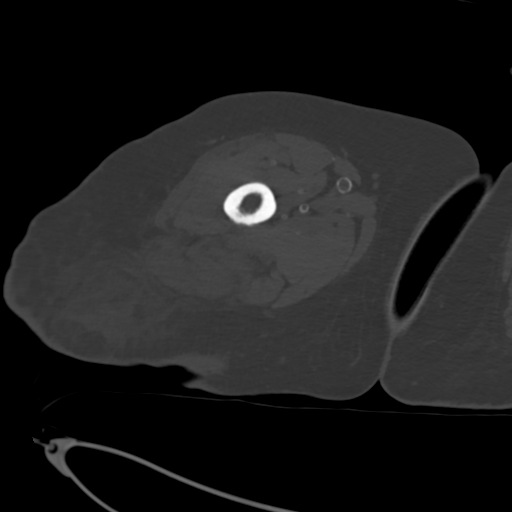
[im 20/155  soft-tissue]
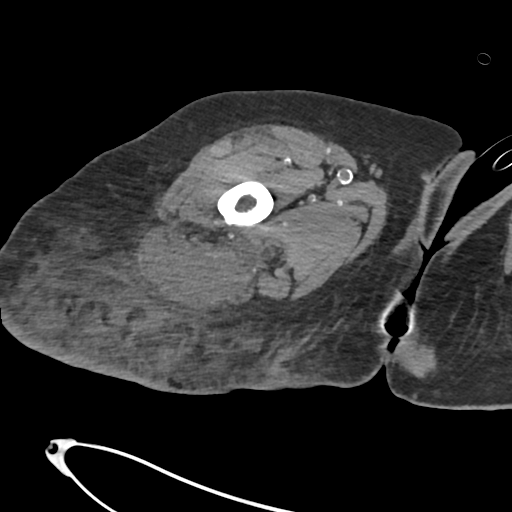
[im 30/155  soft-tissue]
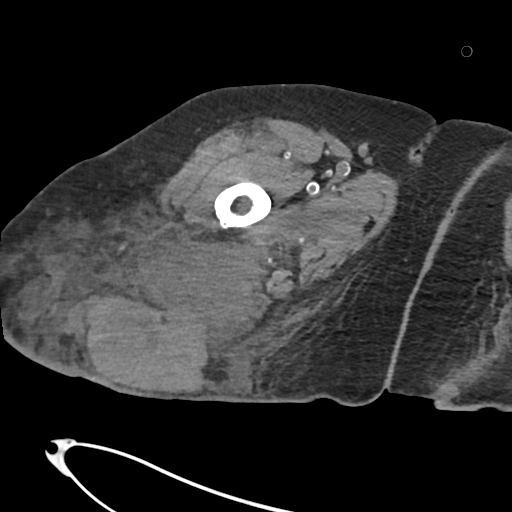
[im 40/155  soft-tissue]
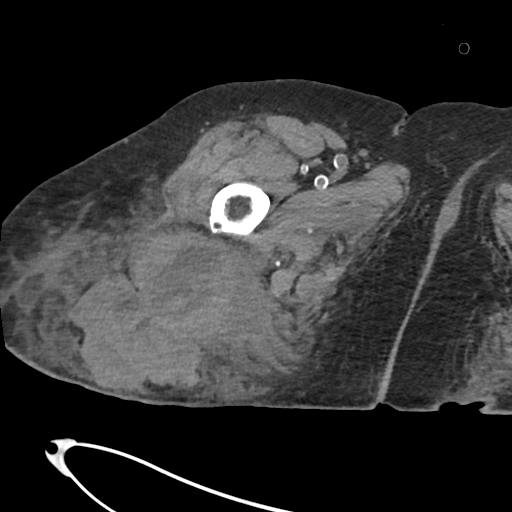
[im 50/155  soft-tissue]
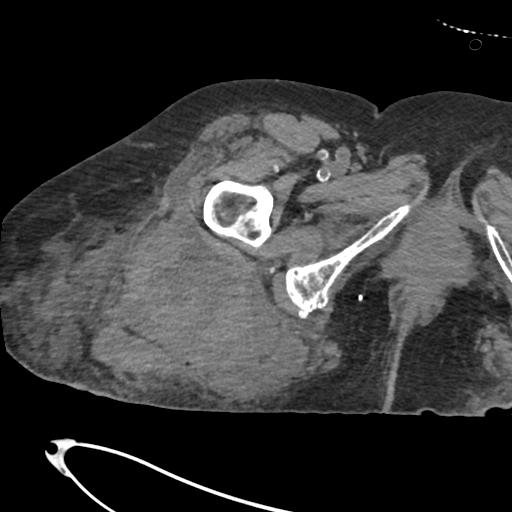
[im 60/155  soft-tissue]
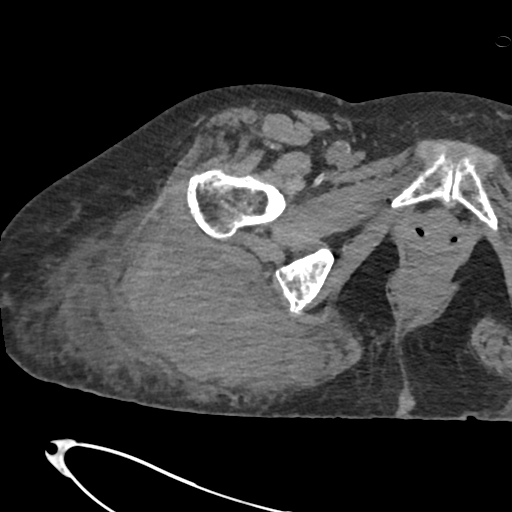
[im 70/155  soft-tissue]
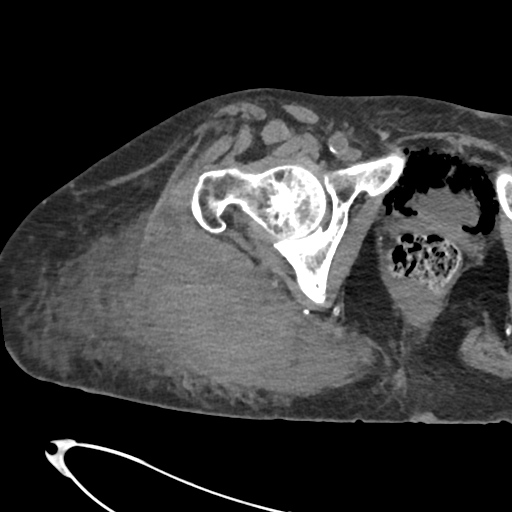
[im 85/155  soft-tissue]
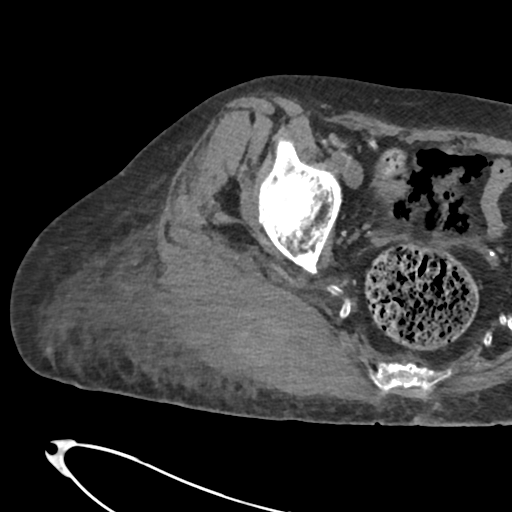
[im 95/155  soft-tissue]
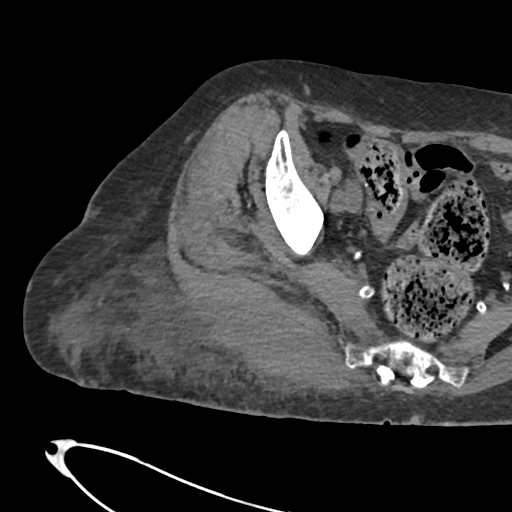
[im 95/155  bone]
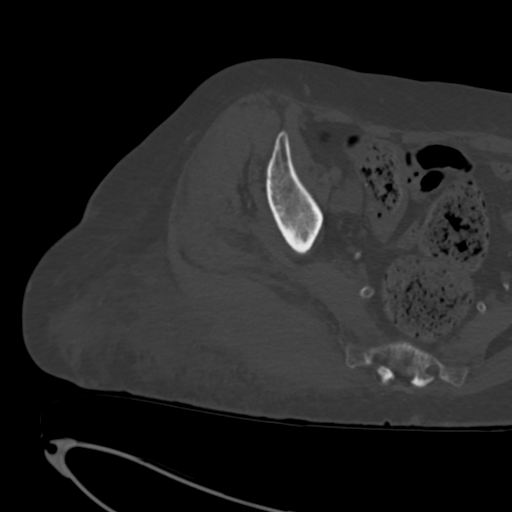
[im 105/155  soft-tissue]
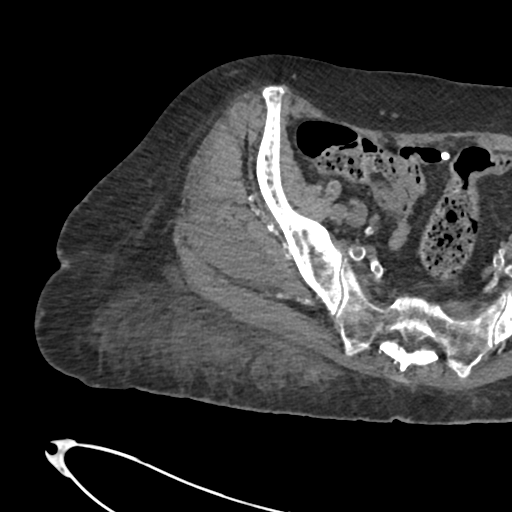
[im 115/155  soft-tissue]
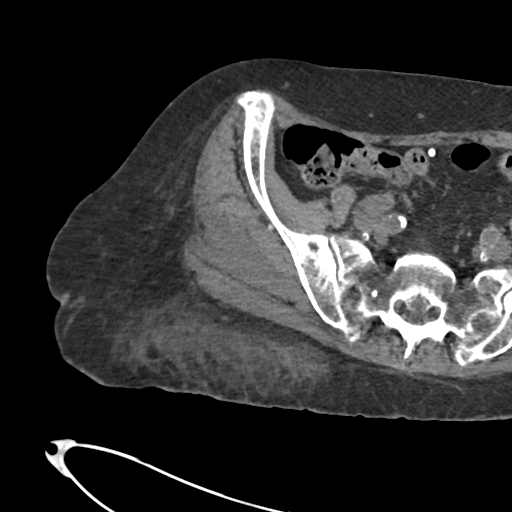
[im 125/155  soft-tissue]
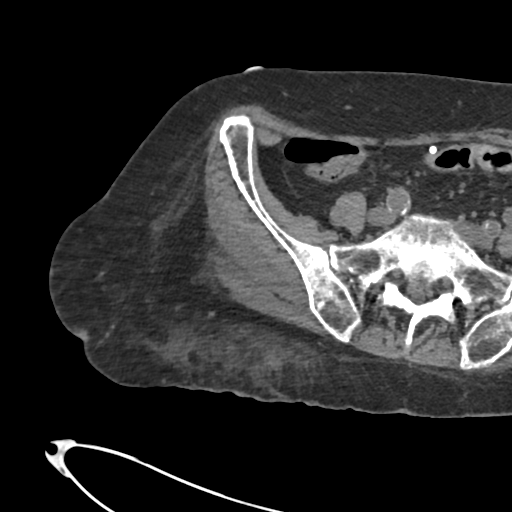
[im 135/155  soft-tissue]
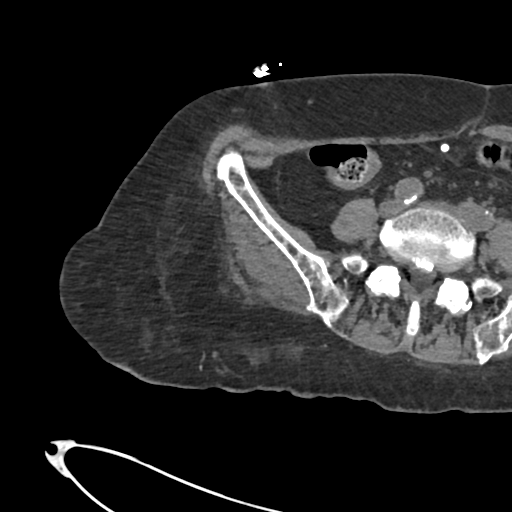
[im 145/155  soft-tissue]
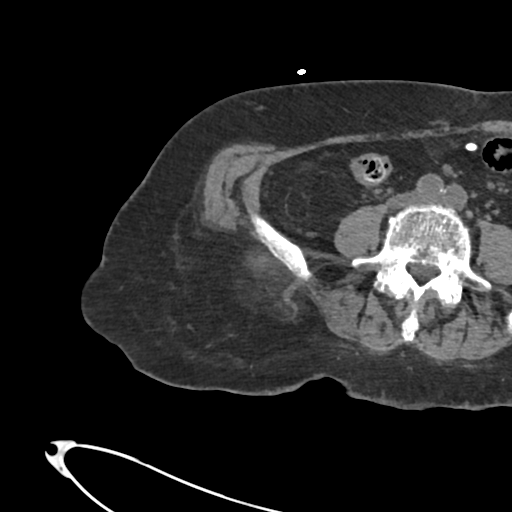

[Series 5: right hip bone cor · coronal · 0.63mm/px · 3 of 114 slices shown]
[im 38/114  soft-tissue]
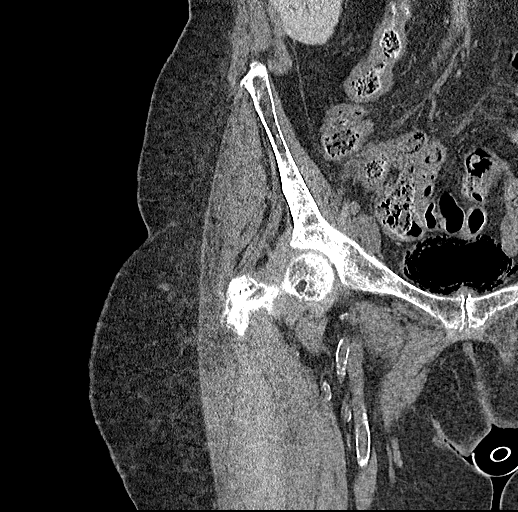
[im 51/114  soft-tissue]
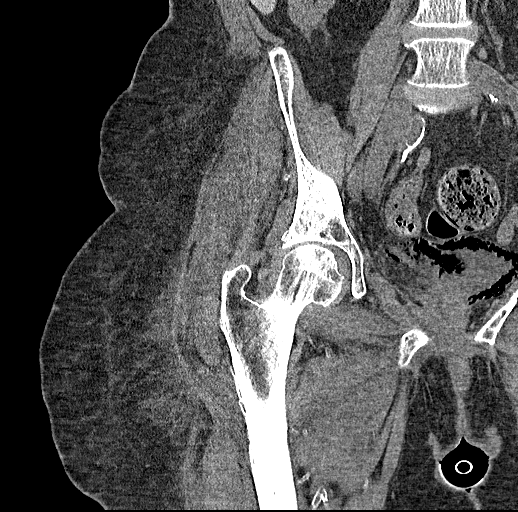
[im 63/114  soft-tissue]
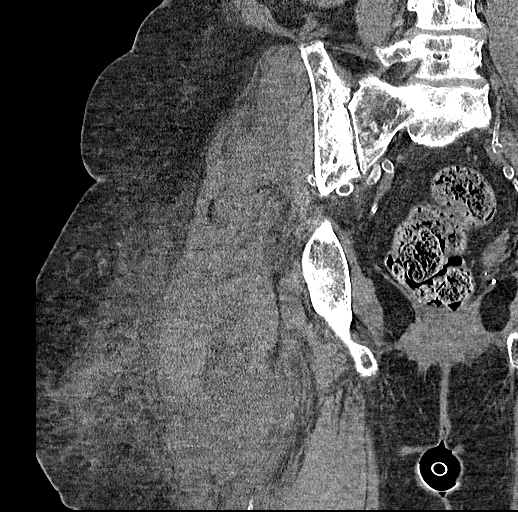

[17 of 46 positions shown; findings below may reference images not displayed]

FINDINGS: No recent fracture is seen in the right hip. There is no
dislocation. There is large area of subcutaneous contusion/hematoma
in the right gluteal region. There is 9.9 x 7 cm hematoma in the
right gluteal muscles. There is another 10.2 x 5.6 cm hematoma in
the subcutaneous plane. There is no significant effusion in the
right hip joint. Extensive arterial calcifications are seen. There
is possible peritoneal dialysis catheter in the lower abdomen. There
is extensive pneumatosis in the bladder wall. There is air in the
lumen of urinary bladder. Large amount of stool is seen in the
rectosigmoid.
IMPRESSION: No recent fracture or dislocation is seen in the right hip. There
are large hematomas in the right gluteal muscles and in the
subcutaneous plane in the right gluteal region extending to the
posterior upper right thigh. There is 9.9 x 7 cm hematoma in the
right gluteus maximus. There is 10.2 x 5.6 cm subcutaneous hematoma
posterior to the gluteal muscles. In addition, there is extensive
subcutaneous edema posterior to the gluteal and subcutaneous
hematomas suggesting contusion extending from the right gluteal
region and along the posterior right upper thigh.

There is extensive pneumatosis in the urinary bladder wall. There is
air in the lumen of urinary bladder. These findings may suggest
recent intervention or infectious process such as emphysematous
cystitis.

## 2023-06-15 IMAGING — CT CT HEAD W/O CM
4 of 5 series · 15 of 47 positions shown, 17 images · non-contrast
Comparison: 03/15/2021

CLINICAL DATA: Head trauma, moderate-severe



[Series 3: head without · axial · non-contrast · 0.42mm/px · z∈[+1231,+1326]mm · 4 of 33 slices shown]
[im 7/33  brain]
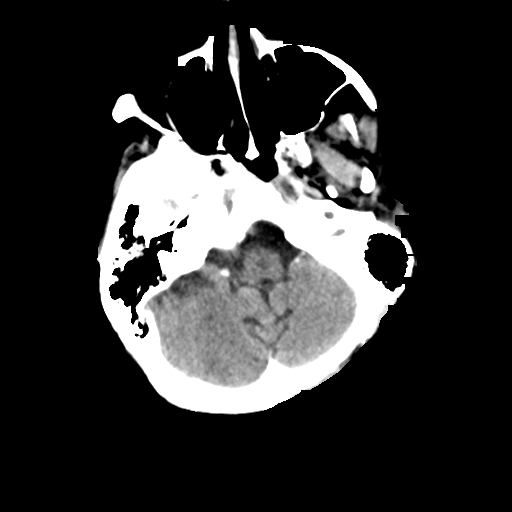
[im 13/33  brain]
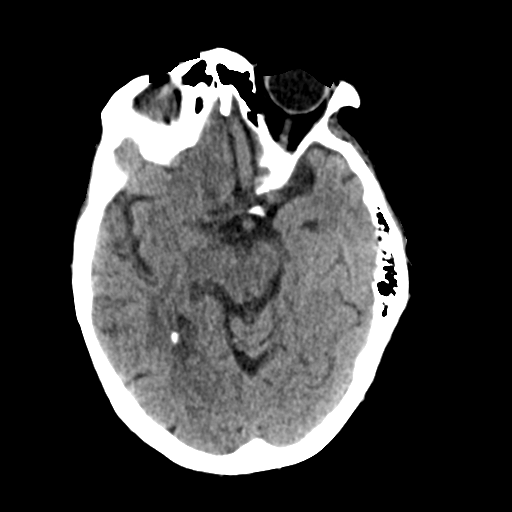
[im 20/33  brain]
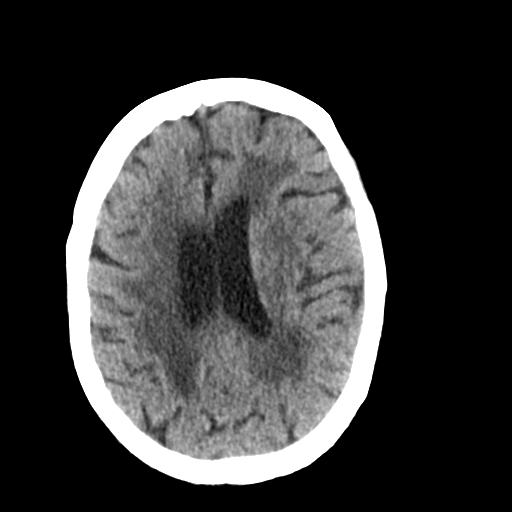
[im 26/33  brain]
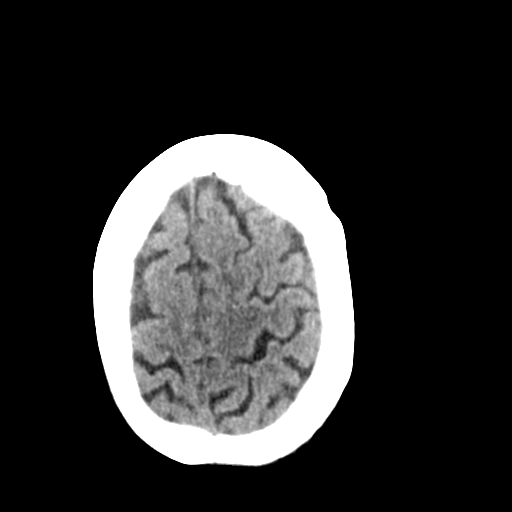

[Series 4: head without ax · axial · non-contrast · 0.41mm/px · z∈[+1205,+1297]mm · 5 of 33 slices shown, 7 images]
[im 6/33  brain]
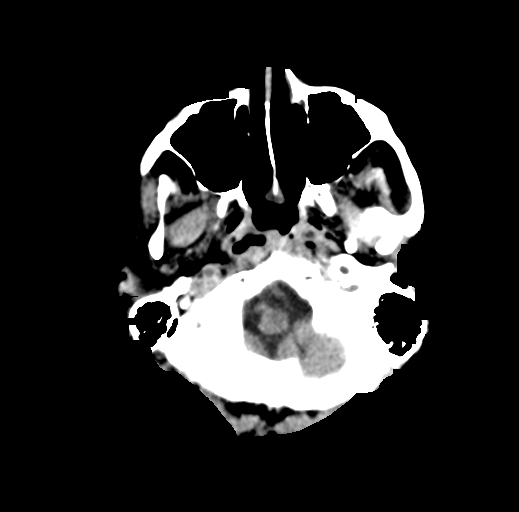
[im 6/33  bone]
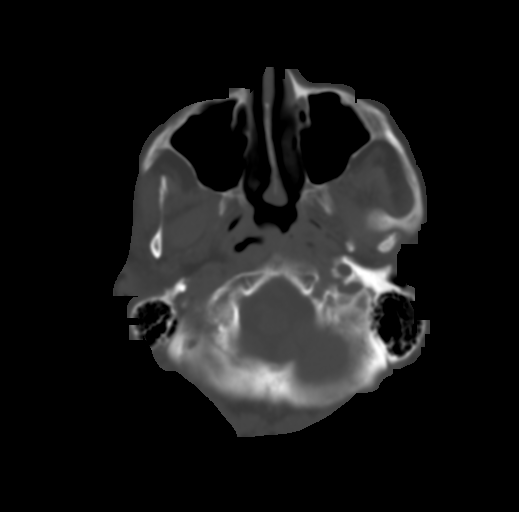
[im 11/33  brain]
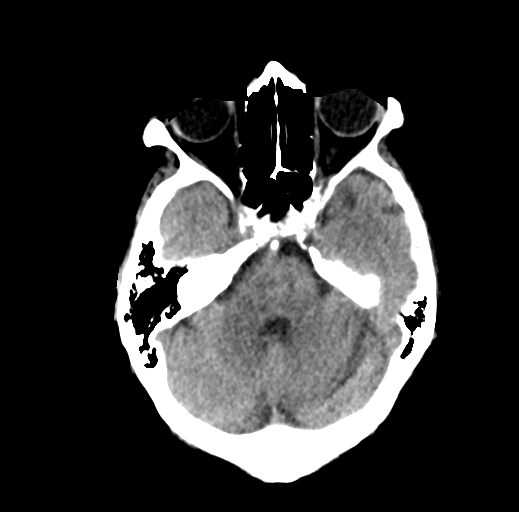
[im 17/33  brain]
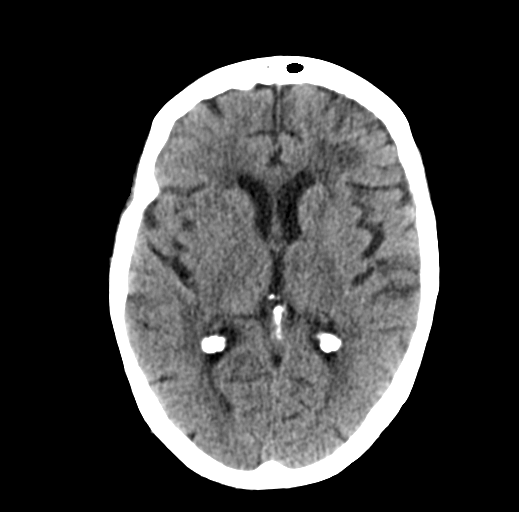
[im 22/33  brain]
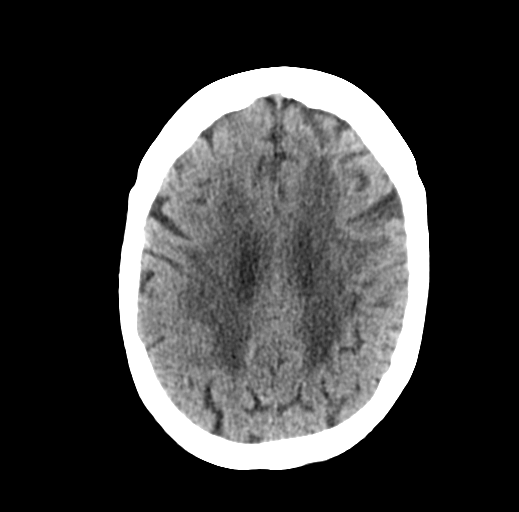
[im 27/33  brain]
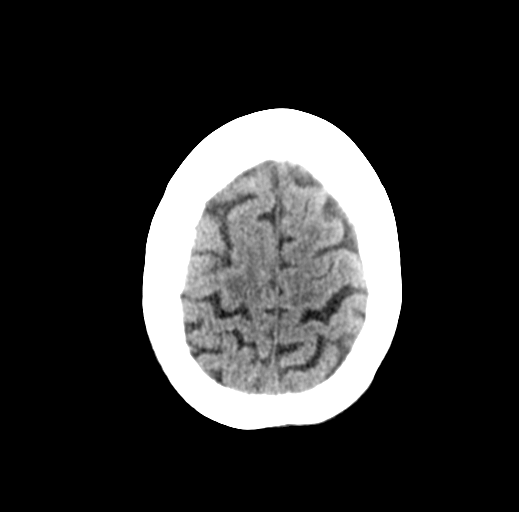
[im 27/33  bone]
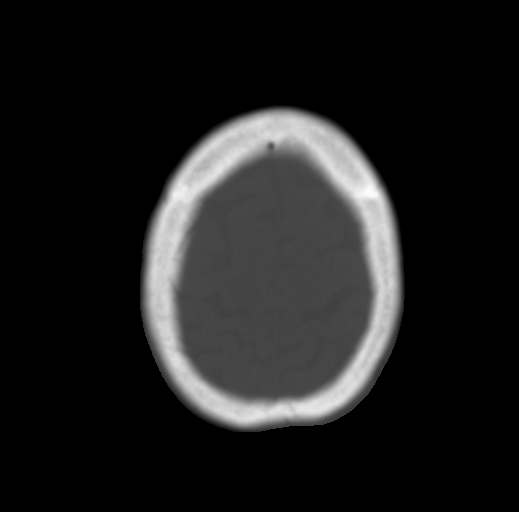

[Series 6: head without cor · coronal · non-contrast · 0.32mm/px · 3 of 62 slices shown]
[im 23/62  brain]
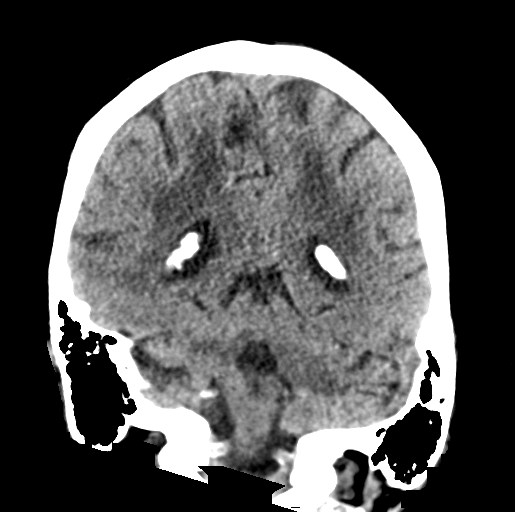
[im 28/62  brain]
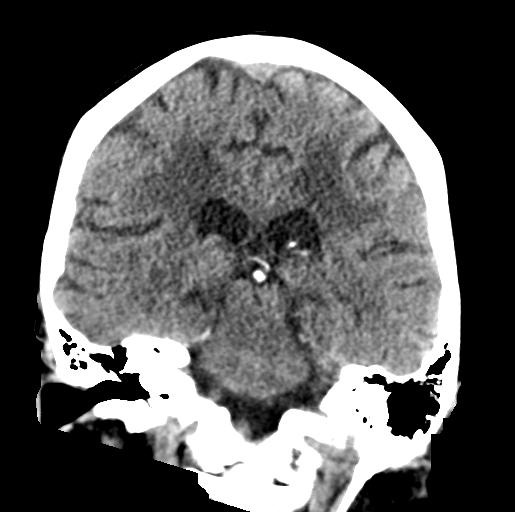
[im 34/62  brain]
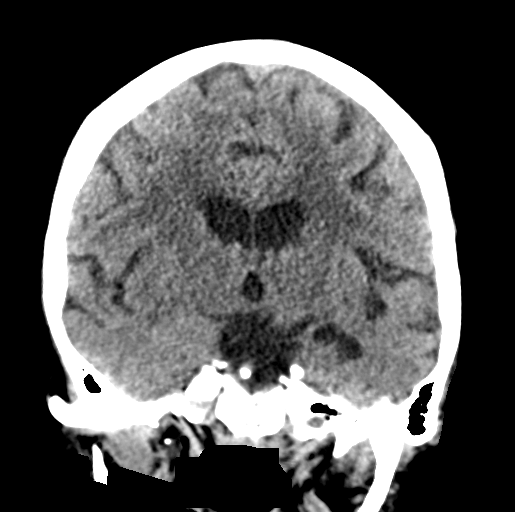

[Series 7: head without sag · sagittal · non-contrast · 0.32mm/px · 3 of 47 slices shown]
[im 19/47  brain]
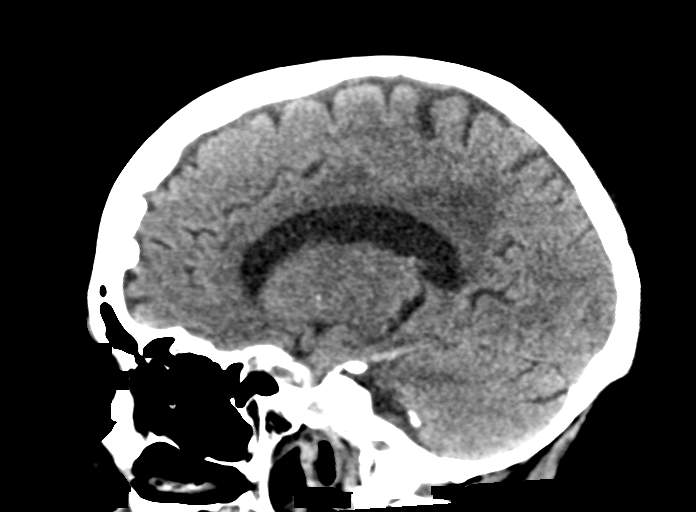
[im 24/47  brain]
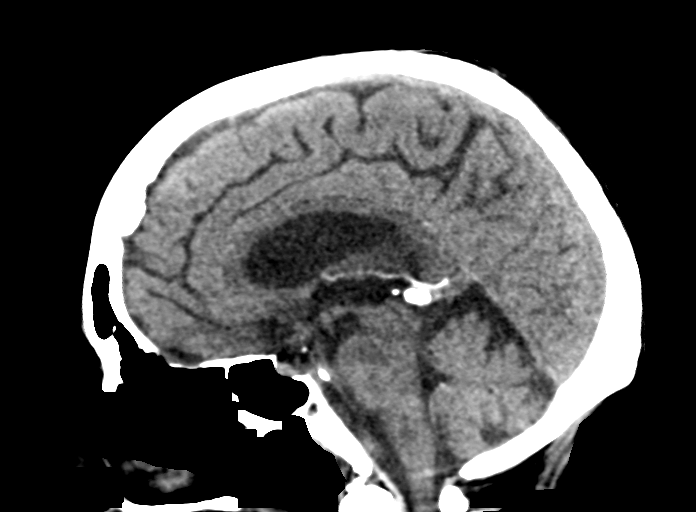
[im 29/47  brain]
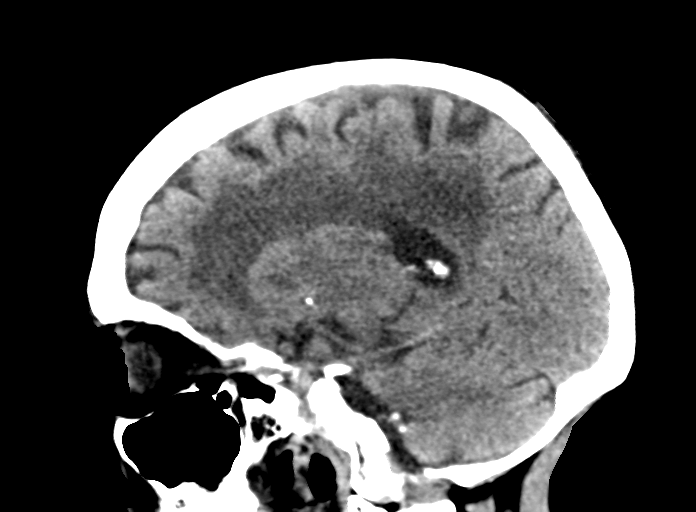

[15 of 47 positions shown; findings below may reference images not displayed]

FINDINGS: Brain: No evidence of acute infarction, hemorrhage, hydrocephalus,
extra-axial collection or mass lesion/mass effect. Small focus of
encephalomalacia within the left caudate head at site of previously
seen hemorrhage. Moderate-advanced low-density changes within the
periventricular and subcortical white matter compatible with chronic
microvascular ischemic change. Mild diffuse cerebral volume loss.

Vascular: Atherosclerotic calcifications involving the large vessels
of the skull base. No unexpected hyperdense vessel.

Skull: Normal. Negative for fracture or focal lesion.

Sinuses/Orbits: No acute finding.

Other: None.
IMPRESSION: 1. No acute intracranial abnormality.
2. Chronic microvascular ischemic disease.

## 2023-06-15 IMAGING — DX DG CHEST 1V PORT
1 series · 1 of 1 positions shown · non-contrast
Comparison: 12/15/2020

CLINICAL DATA: Level 2 trauma, fall

EXAM:
PORTABLE CHEST 1 VIEW

[chest ap]
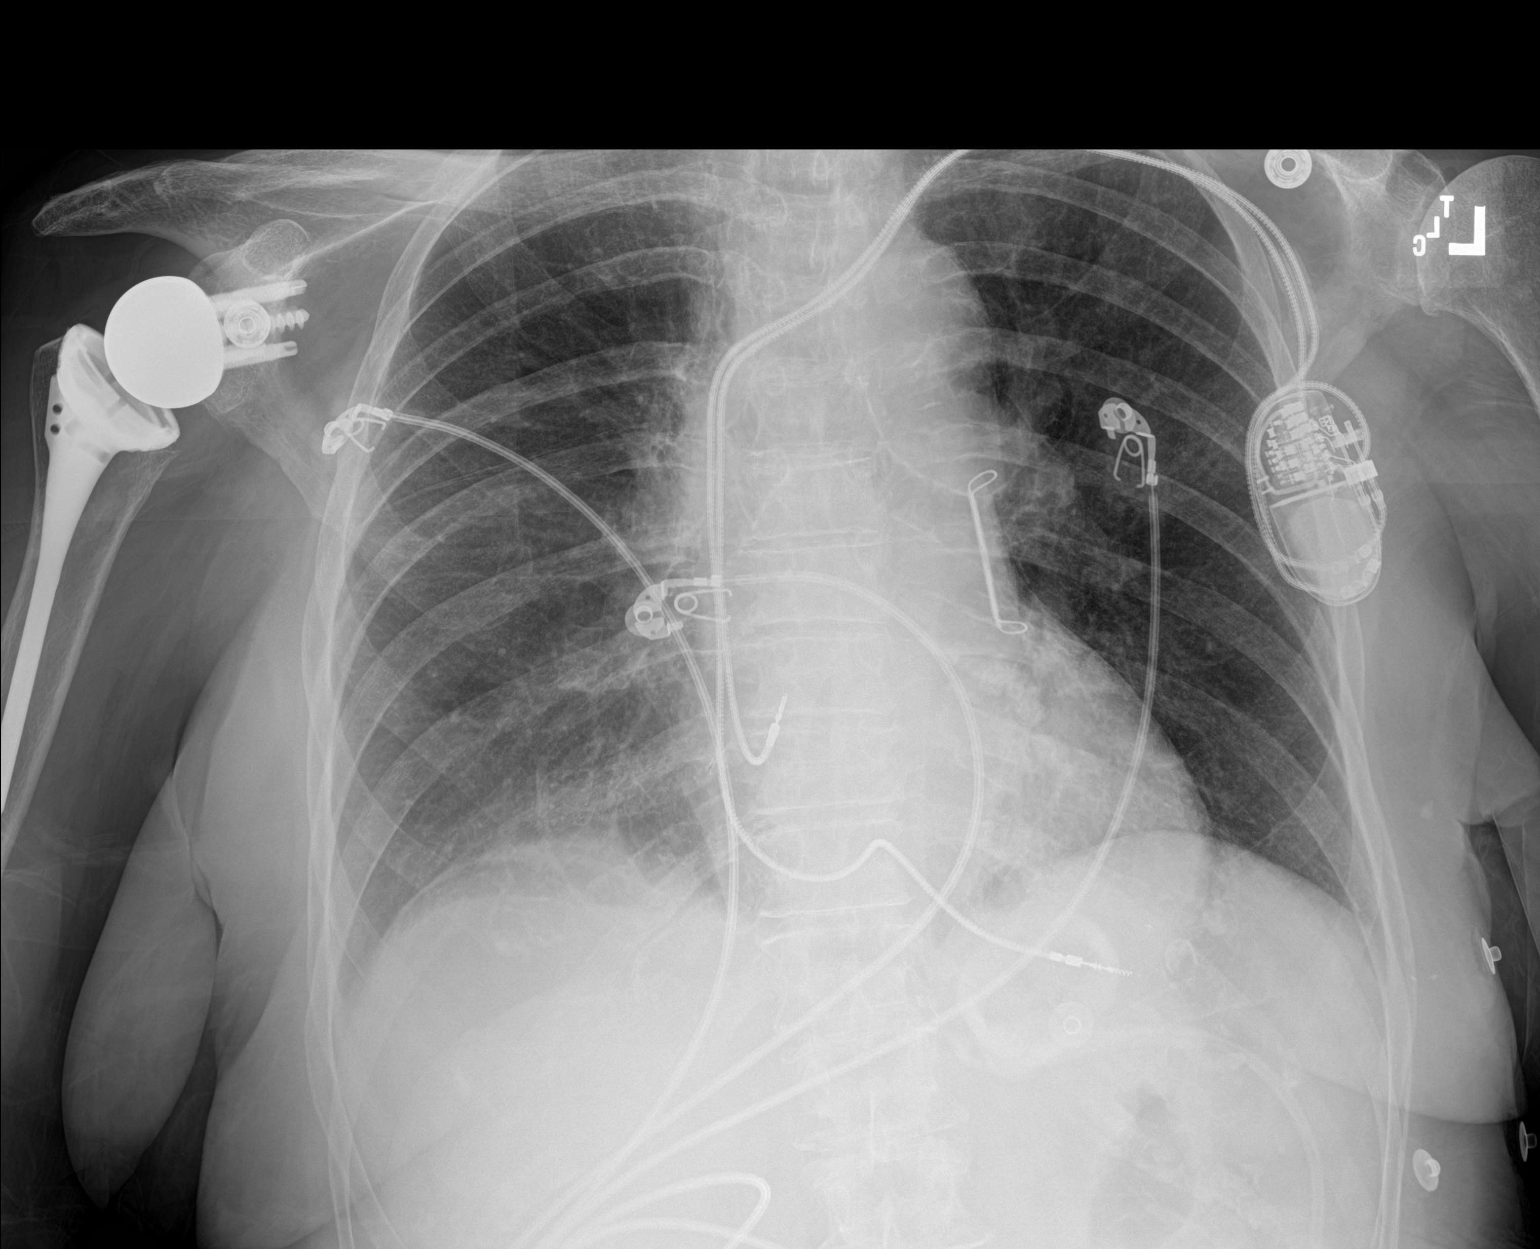

[1 of 1 positions shown; findings below may reference images not displayed]

FINDINGS: Stable postoperative changes including left subclavian 2 lead pacer,
left atrial appendage clip, and right shoulder reverse arthroplasty.

Stable heart size and vascularity. Minor right base atelectasis or
scarring. No focal pneumonia, collapse or consolidation. No large
effusion or pneumothorax. Trachea midline. Aorta atherosclerotic.
IMPRESSION: Stable chronic postoperative findings.

Right basilar atelectasis versus scarring.

Aortic Atherosclerosis ([id number]).

## 2023-06-15 IMAGING — CT CT RENAL STONE PROTOCOL
2 of 4 series · 15 of 46 positions shown, 17 images · non-contrast
Comparison: CT abdomen dated 07/18/2020.

CLINICAL DATA: Nephrolithiasis.



[Series 3: stone study 5.0 i30f 2 · axial · 0.93mm/px · z∈[+821,+1261]mm · 12 of 102 slices shown, 14 images]
[im 9/102  soft-tissue]
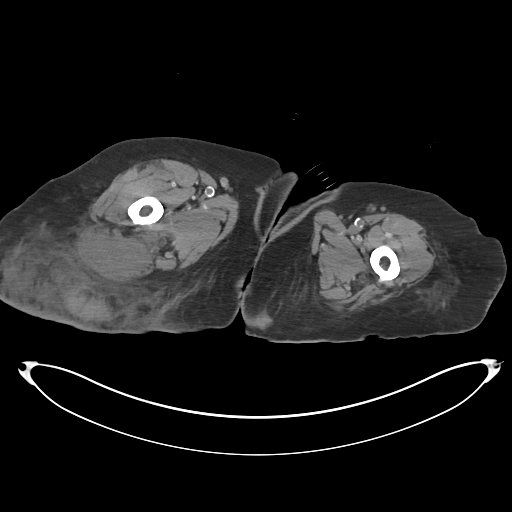
[im 9/102  bone]
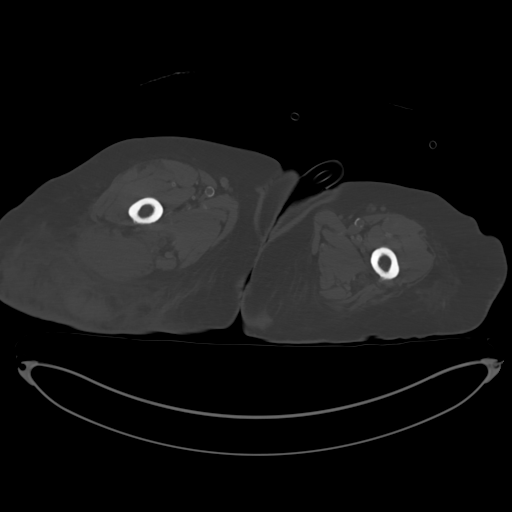
[im 17/102  soft-tissue]
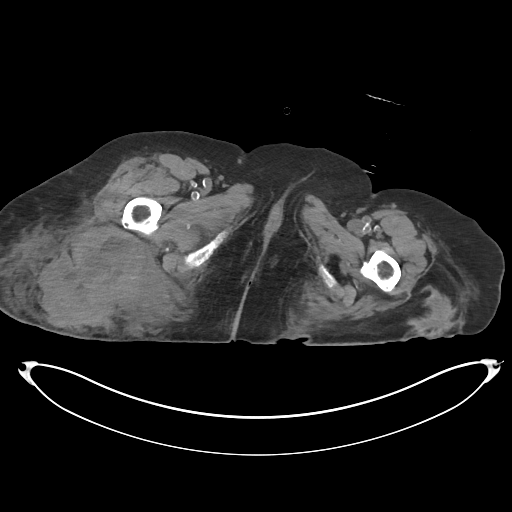
[im 25/102  soft-tissue]
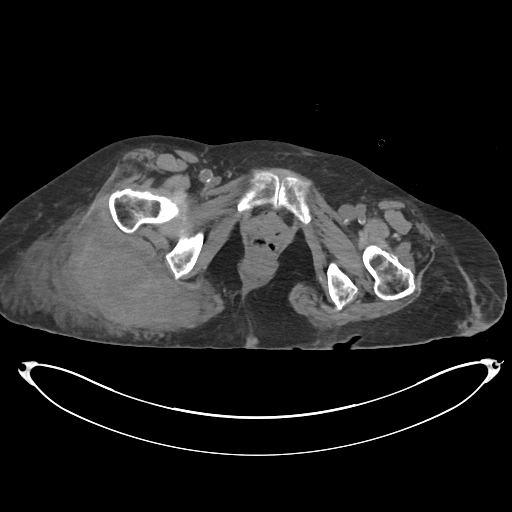
[im 33/102  soft-tissue]
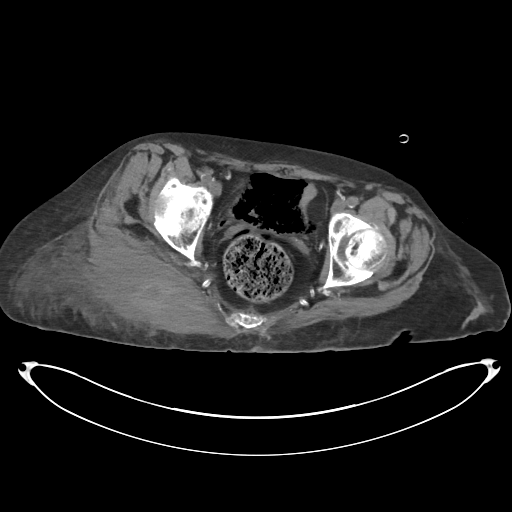
[im 41/102  soft-tissue]
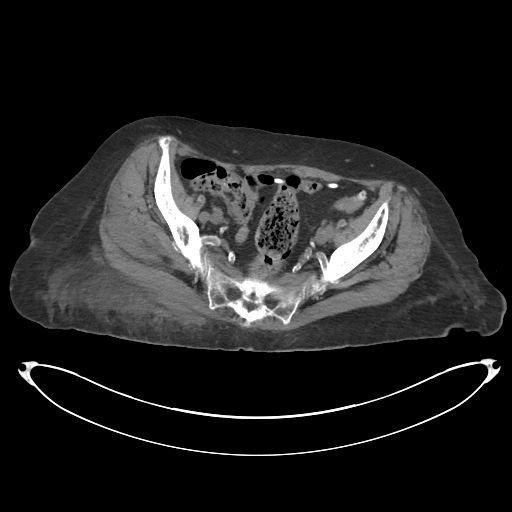
[im 49/102  soft-tissue]
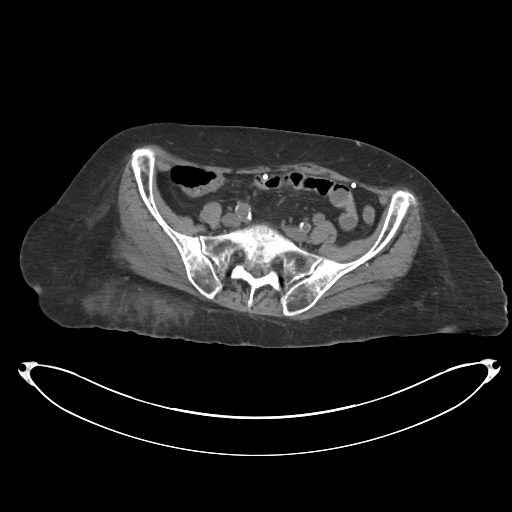
[im 57/102  soft-tissue]
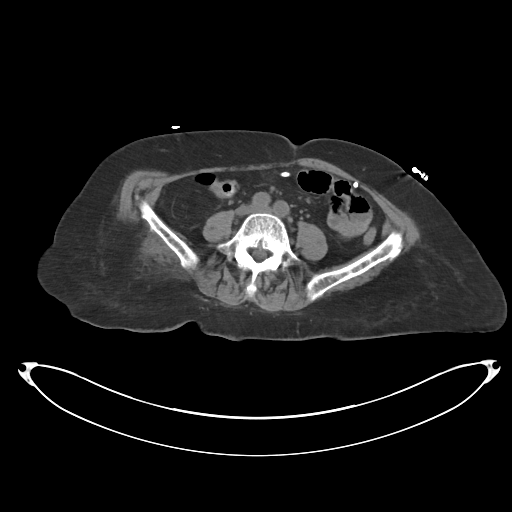
[im 65/102  soft-tissue]
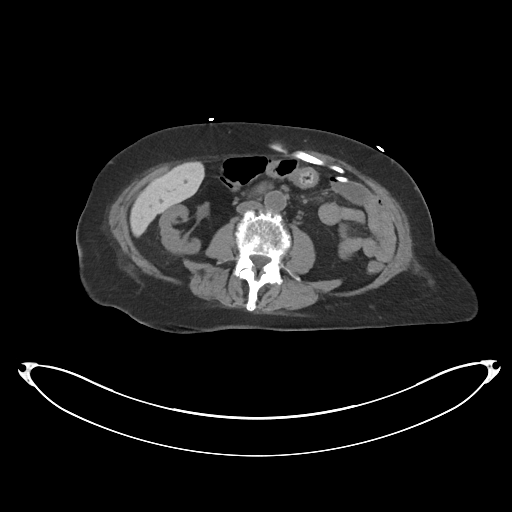
[im 73/102  soft-tissue]
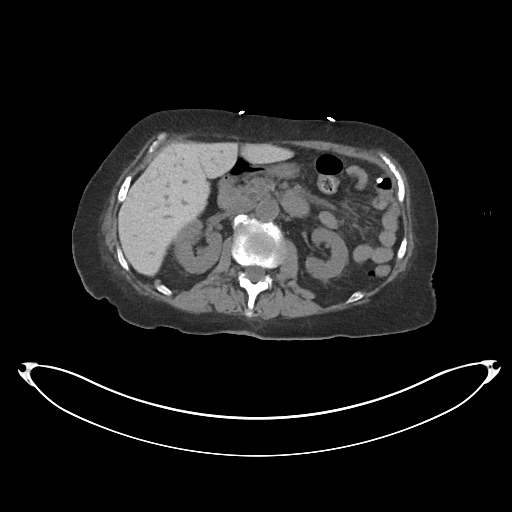
[im 73/102  bone]
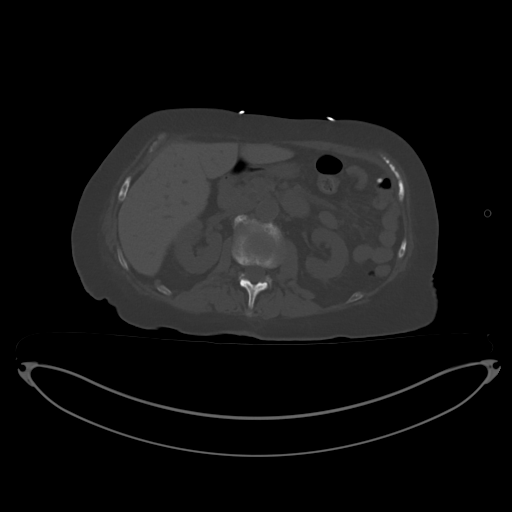
[im 81/102  soft-tissue]
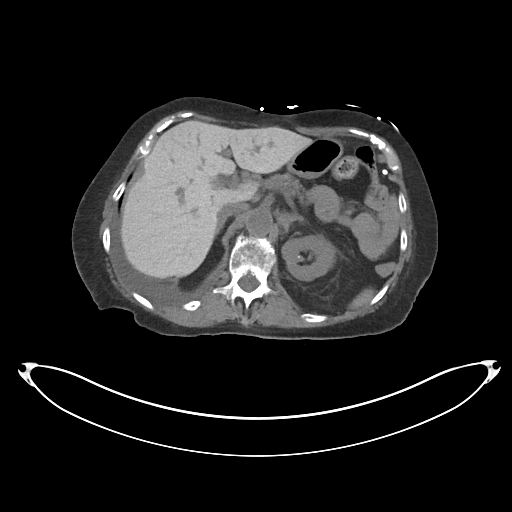
[im 89/102  soft-tissue]
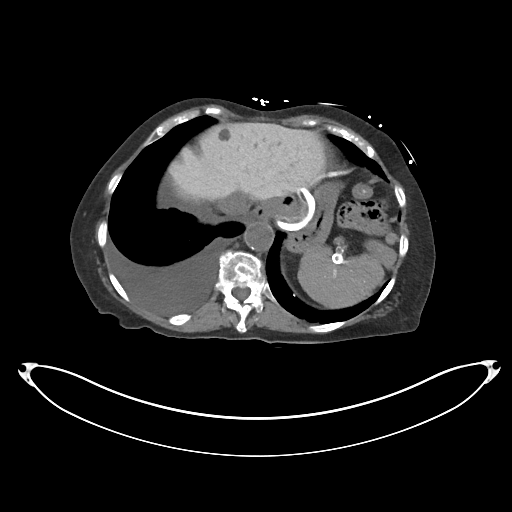
[im 97/102  soft-tissue]
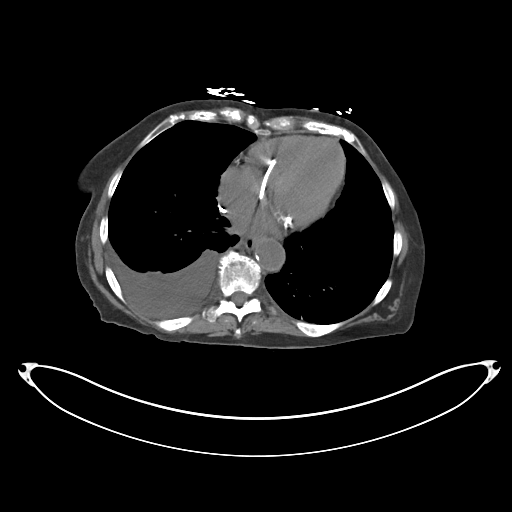

[Series 6: coronal soft tissue · coronal · 0.96mm/px · 3 of 98 slices shown]
[im 33/98  soft-tissue]
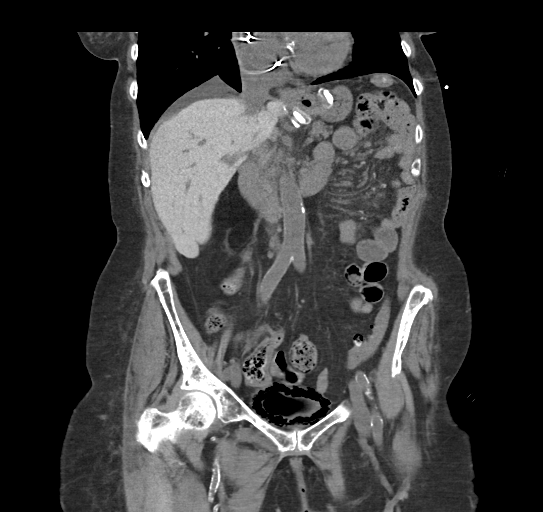
[im 44/98  soft-tissue]
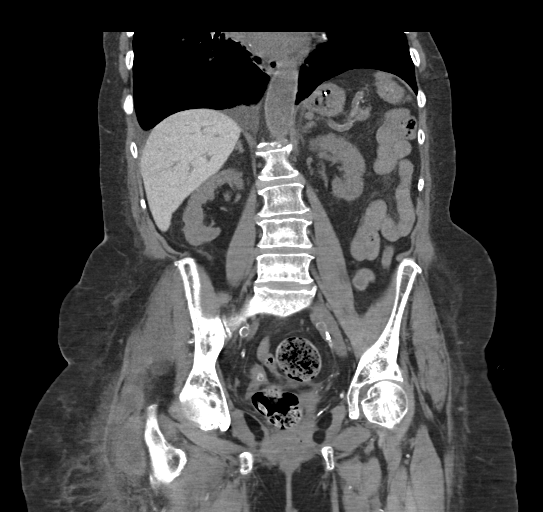
[im 54/98  soft-tissue]
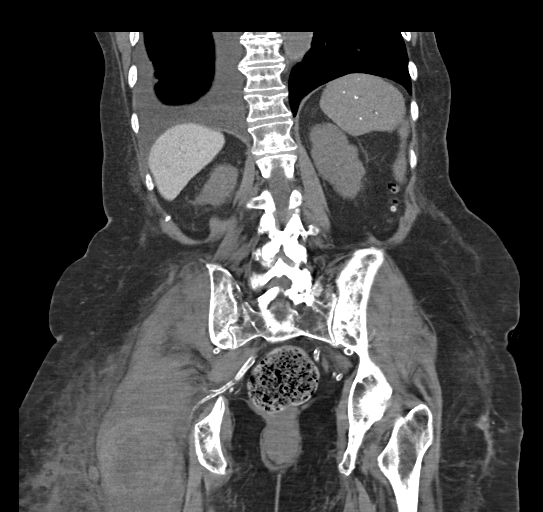

[15 of 46 positions shown; findings below may reference images not displayed]

FINDINGS: Lower chest: RIGHT pleural effusion, small to moderate in size,
incompletely imaged.

Hepatobiliary: Liver is hyperdense. No focal mass or lesion is seen
within the liver. Status post cholecystectomy. No significant bile
duct dilatation appreciated.

Pancreas: Unremarkable. No pancreatic ductal dilatation or
surrounding inflammatory changes.

Spleen: Unremarkable.

Adrenals/Urinary Tract: Adrenal glands are unremarkable. Multiple
LEFT renal stones, largest measuring 7 mm. No hydronephrosis.
Several punctate RIGHT renal stones. No hydronephrosis. No ureteral
or bladder calculi are identified. Marked amount of air within the
bladder, including throughout the bladder walls, compatible with
emphysematous cystitis.

Stomach/Bowel: No dilated large or small bowel loops. No evidence of
bowel wall inflammation. Lap band in place. Stomach otherwise
unremarkable. Scattered diverticulosis of the descending and sigmoid
colon but no focal inflammatory change to suggest acute
diverticulitis.

Vascular/Lymphatic: Aortic atherosclerosis. No abdominal aortic
aneurysm. No enlarged lymph nodes seen in the abdomen or pelvis.

Reproductive: Presumed hysterectomy. No adnexal mass or free fluid
seen.

Other: No free fluid or abscess collection is seen in the abdomen or
pelvis. No free intraperitoneal air seen.

Musculoskeletal: Degenerative spondylosis of the thoracolumbar
spine, mild to moderate in degree. No acute-appearing osseous
abnormality.

Heterogeneously dense mass-like structure involving the RIGHT
gluteus musculature, extending into the overlying subcutaneous soft
tissues, measuring approximately 15 x 9 x 11 cm, presumably a large
hematoma, less likely abscess or neoplastic mass.
IMPRESSION: 1. Marked amount of air within the bladder, including throughout the
bladder walls, compatible with a severe emphysematous cystitis.
2. Very large mass-like structure involving the RIGHT gluteus
musculature, extending into the overlying subcutaneous soft tissues,
measuring approximately 15 x 9 x 11 cm, presumably a large hematoma,
less likely abscess or neoplastic mass. Ordering physician states
that patient has had a recent fall, therefore, this is consistent
with a large hematoma.
3. Bilateral nephrolithiasis. No hydronephrosis. No ureteral or
bladder calculi are identified.
4. Colonic diverticulosis without evidence of acute diverticulitis.
5. RIGHT pleural effusion, small to moderate in size, incompletely
imaged.
6. Liver is hyperdense suggesting hemochromatosis.

These results were called by telephone at the time of interpretation
on 11/12/2021 at [DATE] to provider [HOSPITAL] [facility] , who verbally
acknowledged these results.

## 2023-06-15 IMAGING — CT CT CERVICAL SPINE W/O CM
5 series · 16 of 33 positions shown, 18 images · non-contrast
Comparison: December 15, 2020.

CLINICAL DATA: Neck trauma in a 75-year-old female.



[Series 4: c_spine 2.0 orthogonals · axial · 0.24mm/px · z∈[+1067,+1145]mm · 3 of 80 slices shown, 4 images]
[im 20/80  soft-tissue]
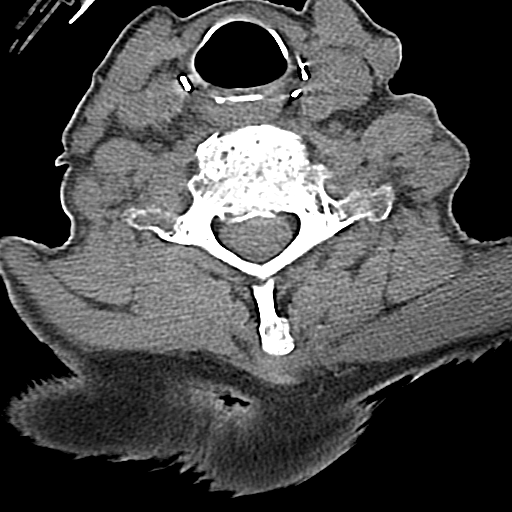
[im 20/80  bone]
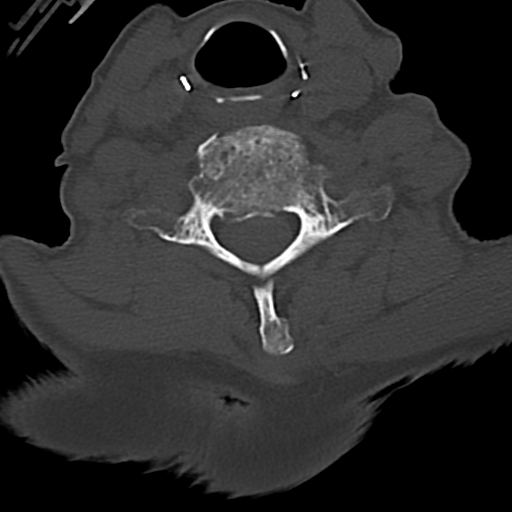
[im 40/80  bone]
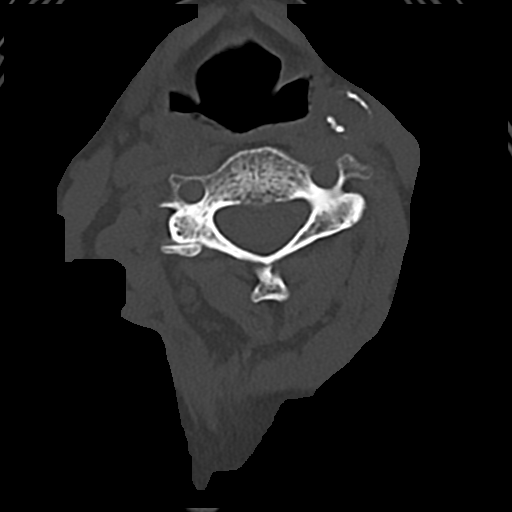
[im 60/80  bone]
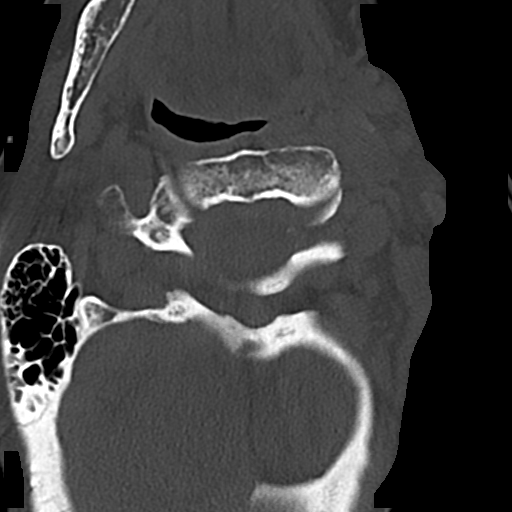

[Series 5: c_spine 2.0 st · axial · 0.30mm/px · z∈[+1109,+1159]mm · 2 of 77 slices shown]
[im 26/77  bone]
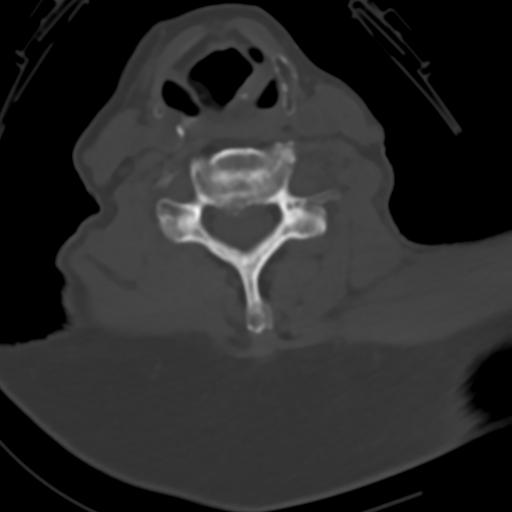
[im 51/77  bone]
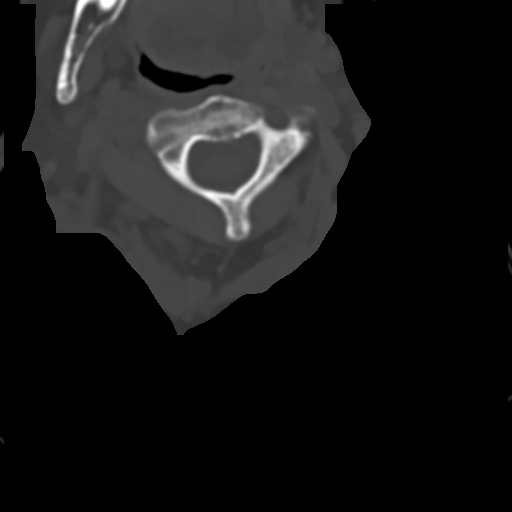

[Series 6: c_spine 2.0 ax bone · axial · 0.30mm/px · z∈[+1072,+1153]mm · 3 of 85 slices shown]
[im 22/85  bone]
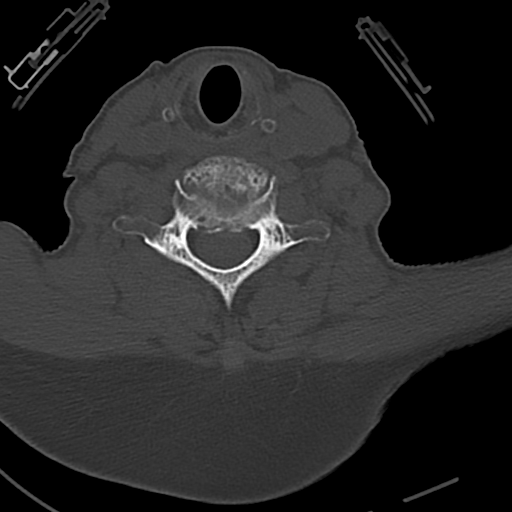
[im 43/85  bone]
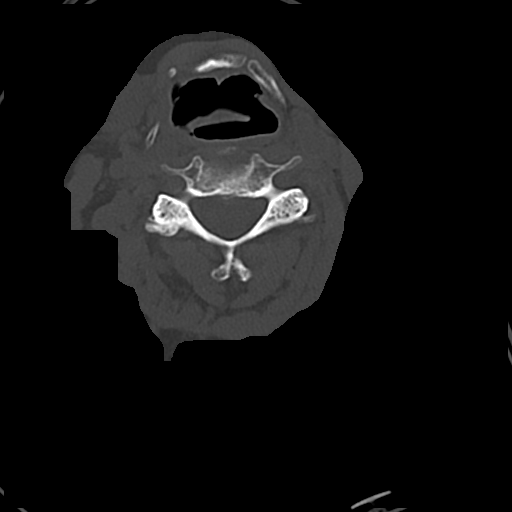
[im 64/85  bone]
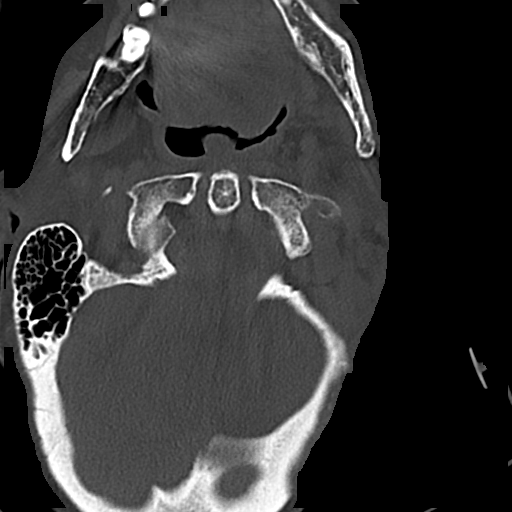

[Series 7: c_spine 2.0 sag bone · sagittal · 0.29mm/px · 5 of 48 slices shown, 6 images]
[im 16/48  bone]
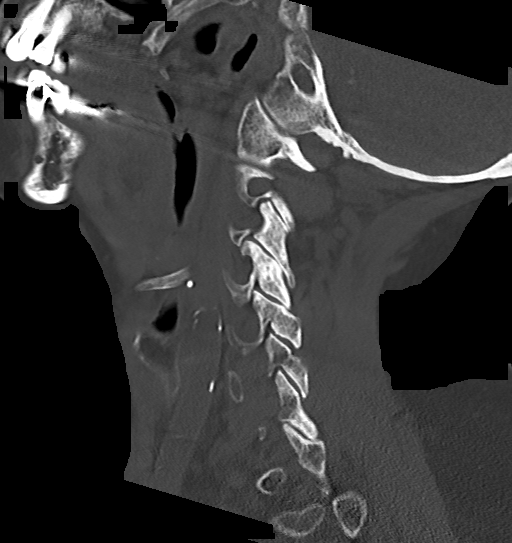
[im 20/48  bone]
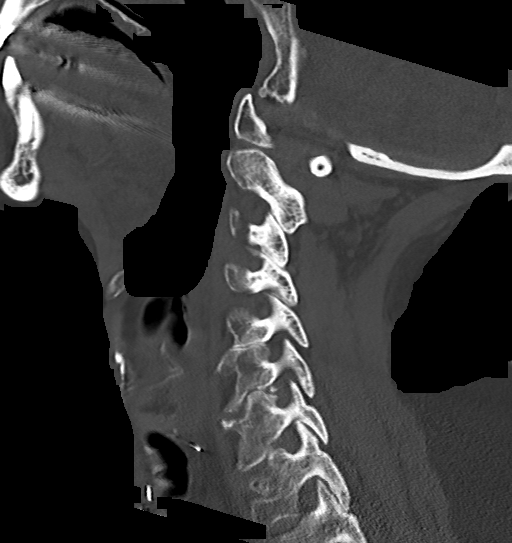
[im 24/48  soft-tissue]
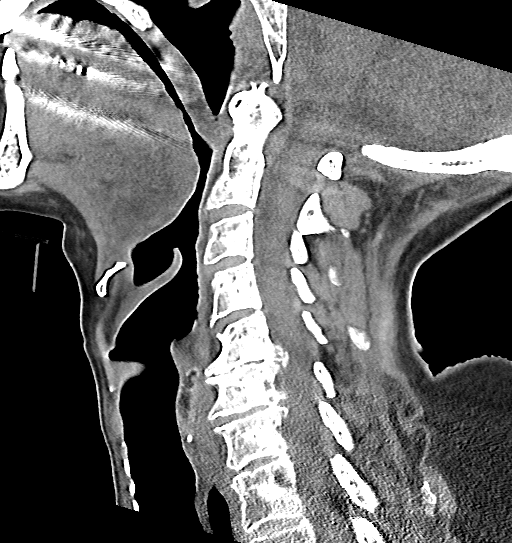
[im 24/48  bone]
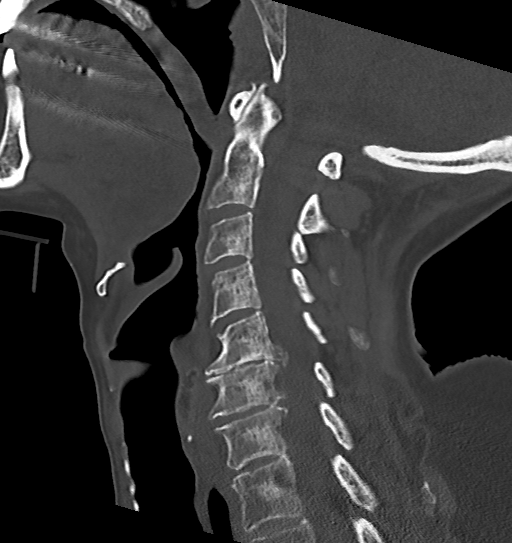
[im 28/48  bone]
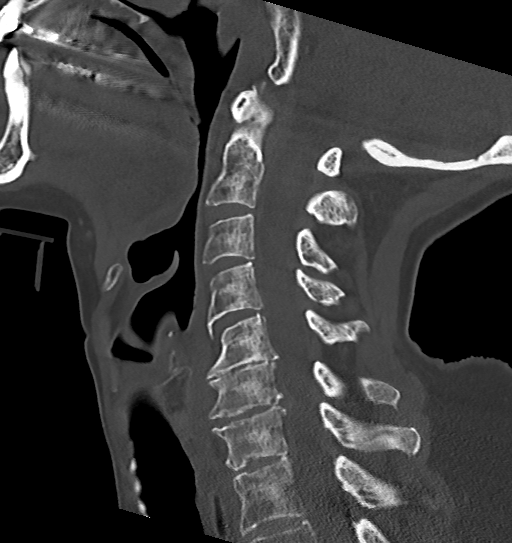
[im 32/48  bone]
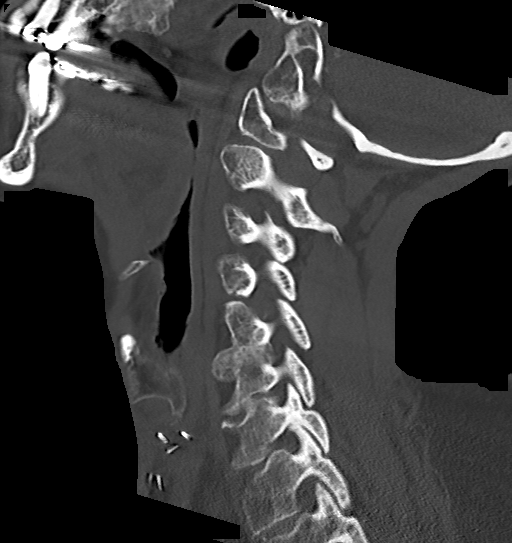

[Series 8: c_spine 2.0 cor bone · coronal · 0.27mm/px · 3 of 72 slices shown]
[im 15/72  bone]
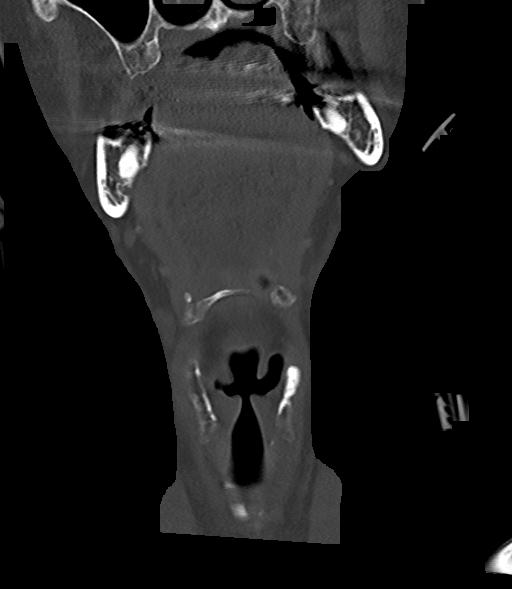
[im 29/72  bone]
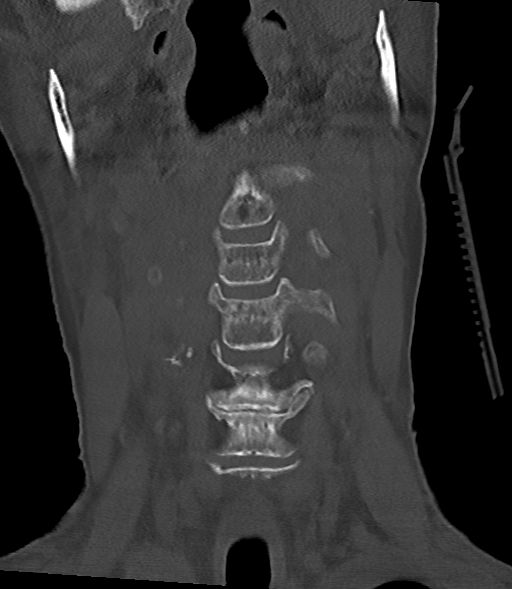
[im 43/72  bone]
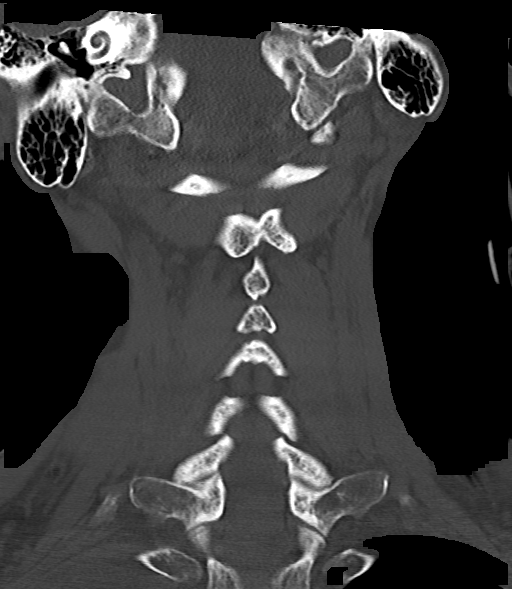

[16 of 33 positions shown; findings below may reference images not displayed]

FINDINGS: Alignment: Normal.

Skull base and vertebrae: No acute fracture. No primary bone lesion
or focal pathologic process.

Soft tissues and spinal canal: No prevertebral fluid or swelling. No
visible canal hematoma.

Disc levels: Multilevel degenerative changes with small to moderate
anterior osteophytes greatest at C5-6. Moderate to marked
uncovertebral spurring with some neural foraminal narrowing at C5-6
and C6-7 greatest on the RIGHT.

Upper chest: Small RIGHT-sided effusion in the upper RIGHT chest
measures water density but is incompletely evaluated. Incidental
imaging of upper chest is otherwise unremarkable.

Other: None
IMPRESSION: 1. No acute fracture or static subluxation of the cervical spine.
2. Multilevel degenerative changes with some neural foraminal
narrowing at C5-6 and C6-7 greatest on the RIGHT.
3. Small RIGHT-sided effusion in the upper RIGHT chest at the apex
measures water density but is incompletely evaluated. Previously
noted to have a RIGHT-sided effusion in Sunday February, 2021 that did not
track to the level of the apex.

## 2023-06-15 IMAGING — DX DG PORTABLE PELVIS
2 series · 2 of 2 positions shown · non-contrast
Comparison: 12/15/2020

CLINICAL DATA: Level 2 trauma, fall, pain

EXAM:
PORTABLE PELVIS 1-2 VIEWS

[pelvis ap (1 of 2)]
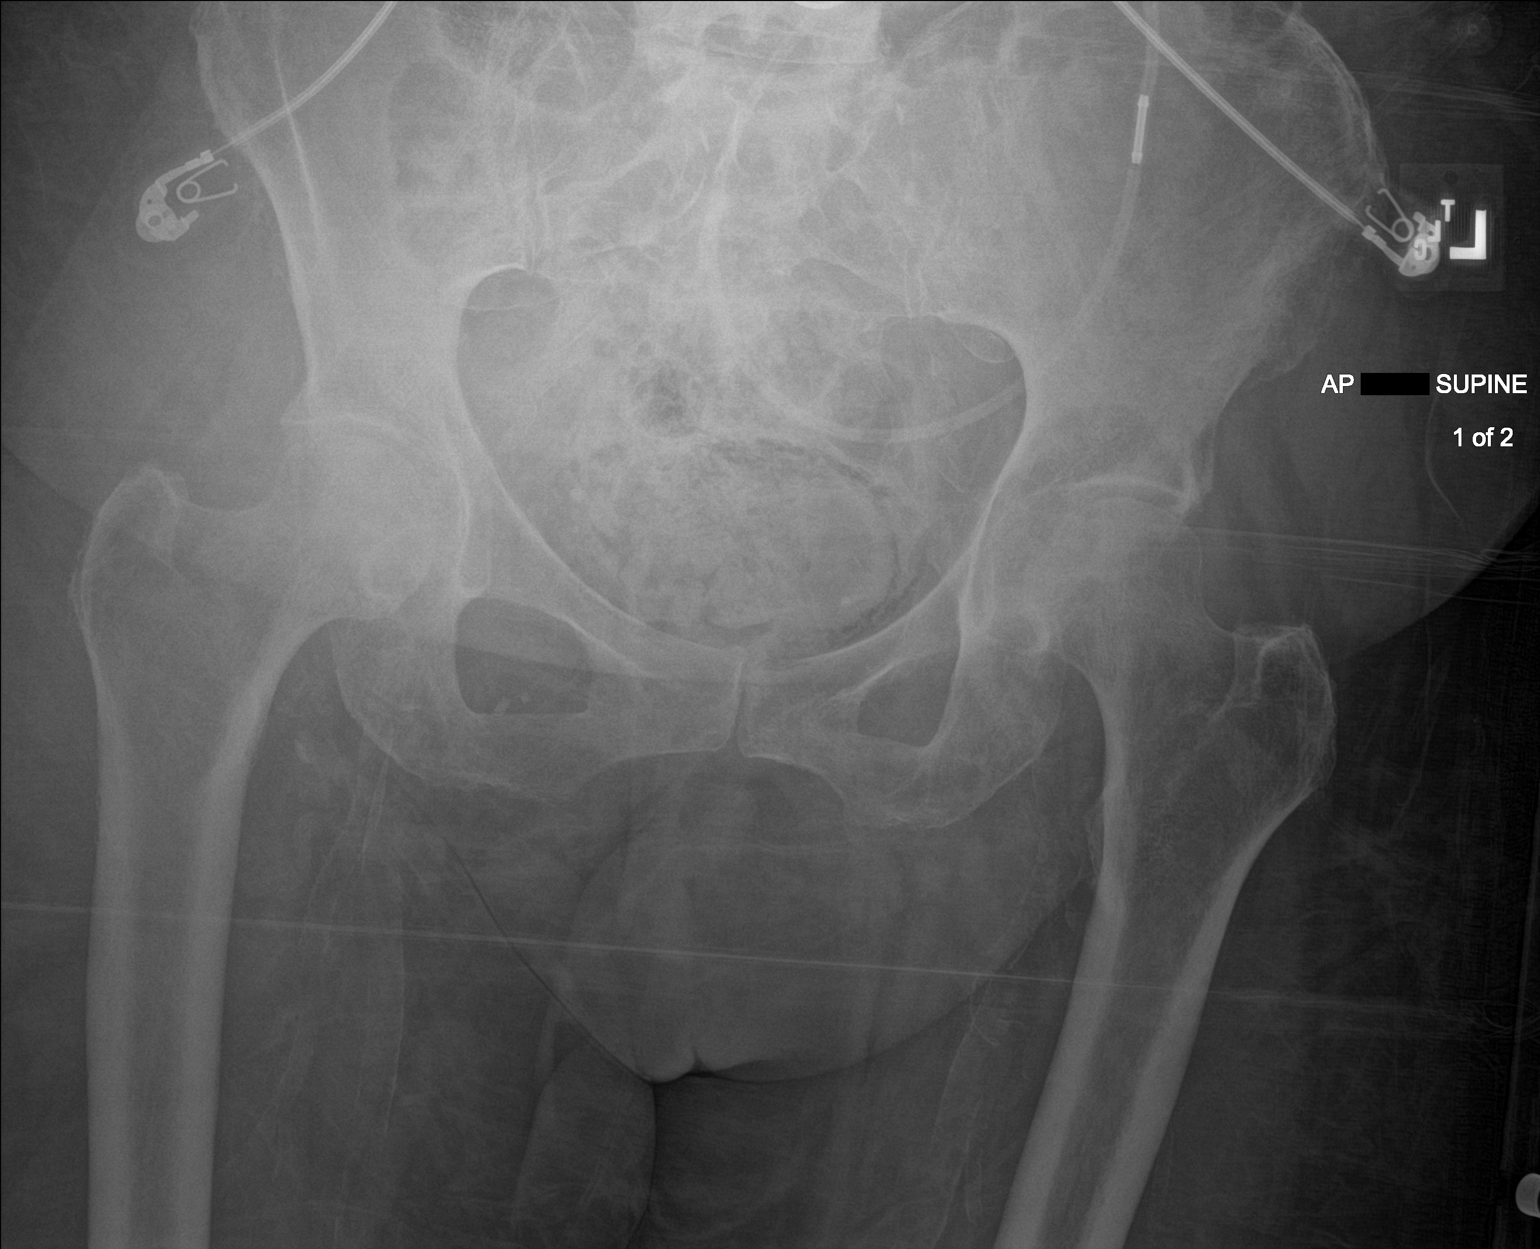

[pelvis ap (2 of 2)]
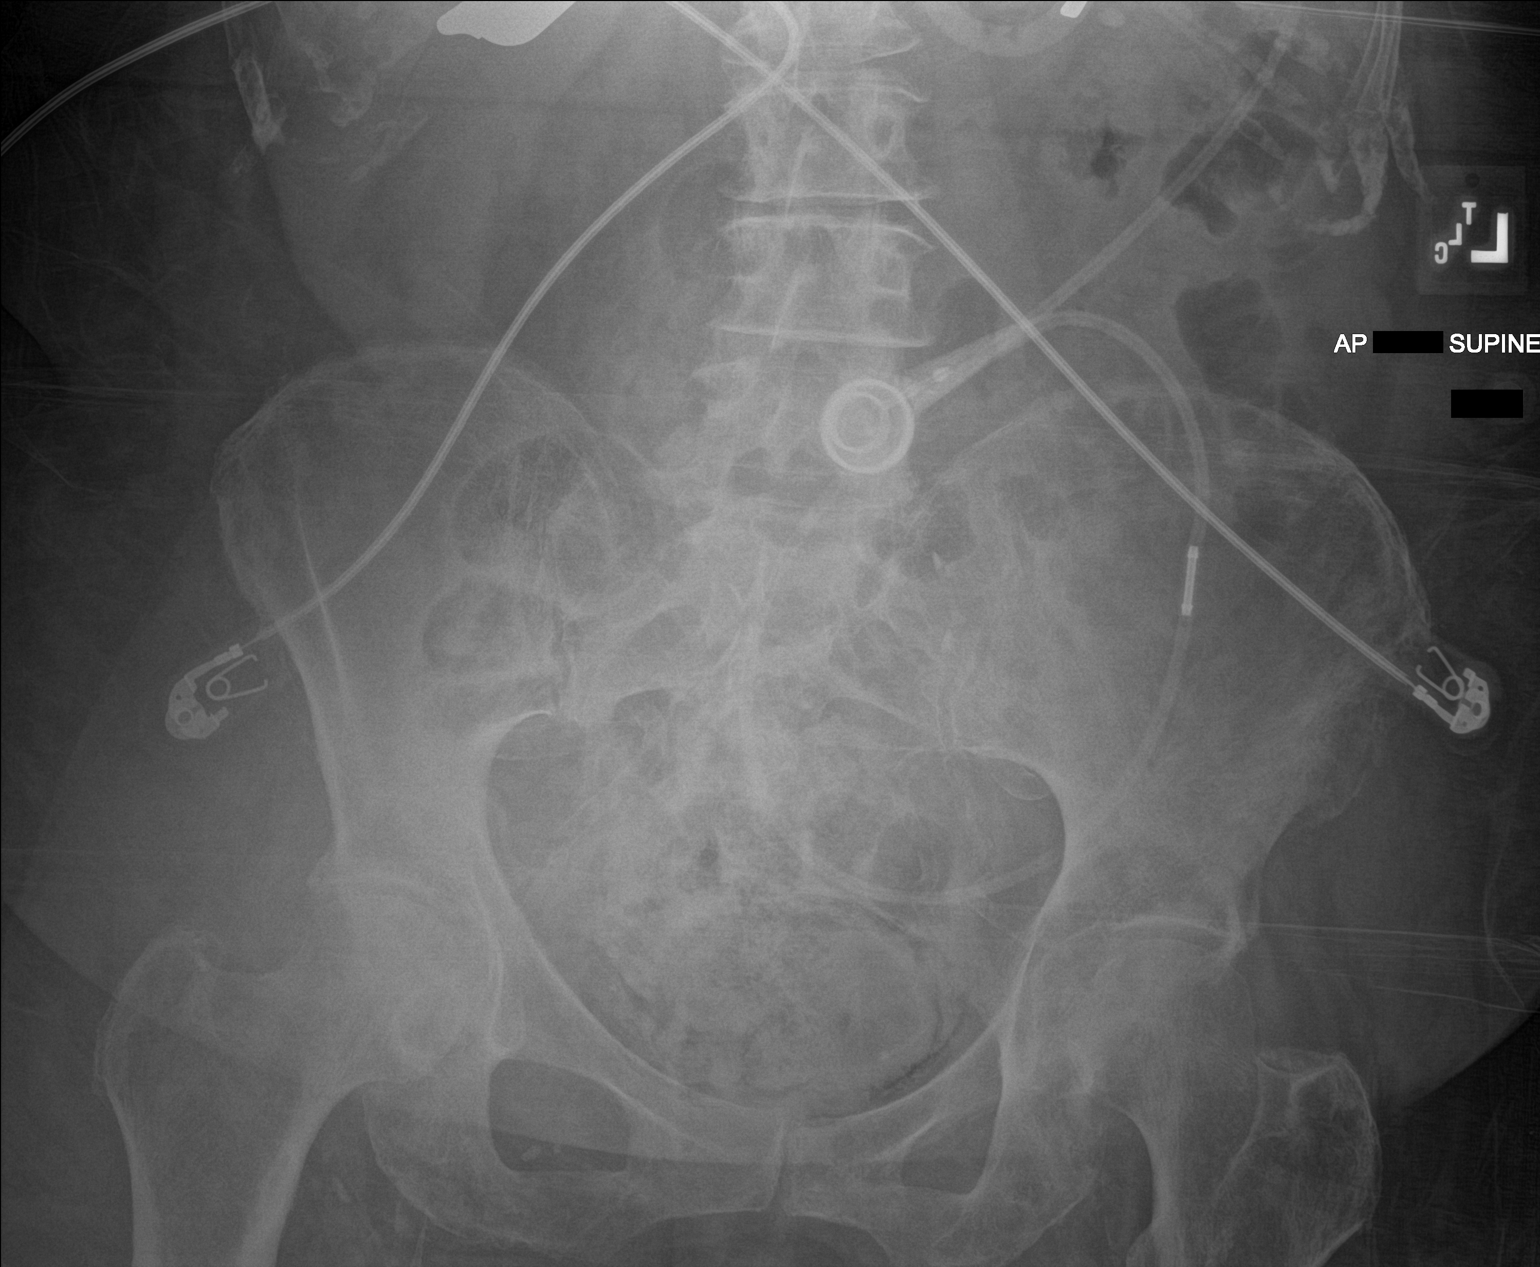

[2 of 2 positions shown; findings below may reference images not displayed]

FINDINGS: Bones are osteopenic. Similar degenerative changes of the pelvis and
both hips. No malalignment or acute osseous finding. Bony pelvis and
hips appear symmetric and intact. Gastric banding hardware noted.
Aortoiliac and femoral atherosclerosis present.
IMPRESSION: No acute osseous finding.

## 2023-06-21 IMAGING — DX DG CHEST 1V PORT
1 series · 1 of 1 positions shown · non-contrast
Comparison: 11/12/2021 and older exams.

CLINICAL DATA: Short of breath.

EXAM:
PORTABLE CHEST 1 VIEW

[chest ap]
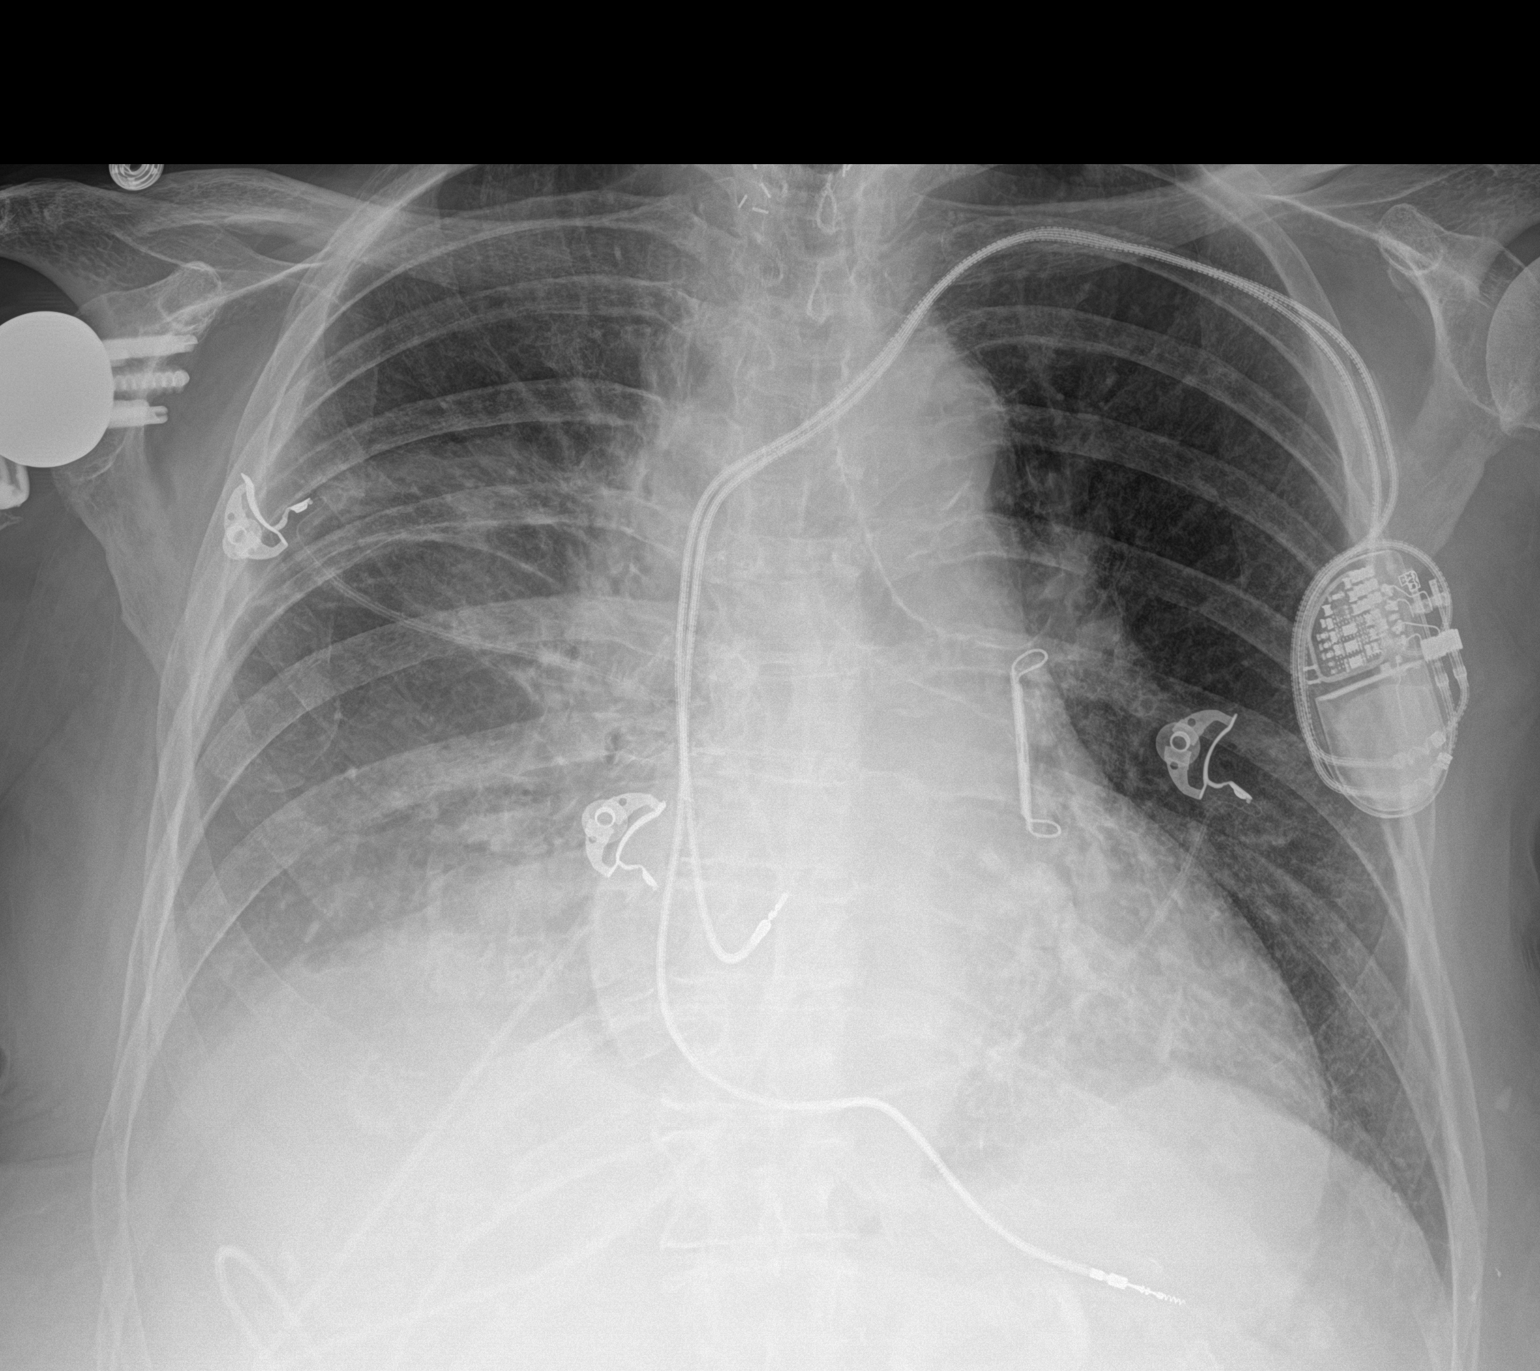

[1 of 1 positions shown; findings below may reference images not displayed]

FINDINGS: Hazy opacity extends from the right mid lung to the right lung base,
partly obscuring the hemidiaphragm. There are associated
interstitial type opacities most evident in the right mid lung and
medial right lung base. Right upper lung is clear. Left lung is
clear.

No pneumothorax.

Cardiac silhouette is normal in size. Stable left anterior chest
wall sequential pacemaker and left atrial occlusion device. No
mediastinal or hilar masses. Previous thyroid surgery, also stable.

Partly imaged reverse right shoulder prosthesis appears well
aligned.
IMPRESSION: 1. Right mid to lower lung opacity, increased compared to the prior
exam, consistent with a combination of a right pleural effusion with
atelectasis, infection or a combination.
2. No other evidence of acute cardiopulmonary disease. No pulmonary
edema.
